# Patient Record
Sex: Female | Born: 1970
Health system: Southern US, Community
[De-identification: ages and names within clinical notes are randomized; demographics above are authoritative.]

## PROBLEM LIST (undated history)

## (undated) DIAGNOSIS — K4090 Unilateral inguinal hernia, without obstruction or gangrene, not specified as recurrent: Secondary | ICD-10-CM

## (undated) DIAGNOSIS — E119 Type 2 diabetes mellitus without complications: Secondary | ICD-10-CM

## (undated) DIAGNOSIS — J410 Simple chronic bronchitis: Secondary | ICD-10-CM

## (undated) DIAGNOSIS — I639 Cerebral infarction, unspecified: Secondary | ICD-10-CM

## (undated) DIAGNOSIS — F319 Bipolar disorder, unspecified: Secondary | ICD-10-CM

## (undated) DIAGNOSIS — N182 Chronic kidney disease, stage 2 (mild): Secondary | ICD-10-CM

## (undated) DIAGNOSIS — G459 Transient cerebral ischemic attack, unspecified: Secondary | ICD-10-CM

## (undated) DIAGNOSIS — M62838 Other muscle spasm: Secondary | ICD-10-CM

## (undated) DIAGNOSIS — R0600 Dyspnea, unspecified: Secondary | ICD-10-CM

## (undated) DIAGNOSIS — C801 Malignant (primary) neoplasm, unspecified: Secondary | ICD-10-CM

## (undated) DIAGNOSIS — W3400XA Accidental discharge from unspecified firearms or gun, initial encounter: Secondary | ICD-10-CM

## (undated) DIAGNOSIS — I2510 Atherosclerotic heart disease of native coronary artery without angina pectoris: Secondary | ICD-10-CM

## (undated) DIAGNOSIS — R39198 Other difficulties with micturition: Secondary | ICD-10-CM

## (undated) DIAGNOSIS — Z8489 Family history of other specified conditions: Secondary | ICD-10-CM

## (undated) DIAGNOSIS — K219 Gastro-esophageal reflux disease without esophagitis: Secondary | ICD-10-CM

## (undated) DIAGNOSIS — S3140XA Unspecified open wound of vagina and vulva, initial encounter: Secondary | ICD-10-CM

## (undated) DIAGNOSIS — Z720 Tobacco use: Secondary | ICD-10-CM

## (undated) DIAGNOSIS — I10 Essential (primary) hypertension: Secondary | ICD-10-CM

## (undated) DIAGNOSIS — I4510 Unspecified right bundle-branch block: Secondary | ICD-10-CM

## (undated) DIAGNOSIS — J449 Chronic obstructive pulmonary disease, unspecified: Secondary | ICD-10-CM

## (undated) DIAGNOSIS — R569 Unspecified convulsions: Secondary | ICD-10-CM

## (undated) DIAGNOSIS — R600 Localized edema: Secondary | ICD-10-CM

## (undated) DIAGNOSIS — I359 Nonrheumatic aortic valve disorder, unspecified: Secondary | ICD-10-CM

## (undated) DIAGNOSIS — N764 Abscess of vulva: Secondary | ICD-10-CM

## (undated) DIAGNOSIS — Z86718 Personal history of other venous thrombosis and embolism: Secondary | ICD-10-CM

## (undated) DIAGNOSIS — A419 Sepsis, unspecified organism: Secondary | ICD-10-CM

## (undated) DIAGNOSIS — R000 Tachycardia, unspecified: Secondary | ICD-10-CM

## (undated) DIAGNOSIS — I509 Heart failure, unspecified: Secondary | ICD-10-CM

## (undated) DIAGNOSIS — F419 Anxiety disorder, unspecified: Secondary | ICD-10-CM

## (undated) DIAGNOSIS — E785 Hyperlipidemia, unspecified: Secondary | ICD-10-CM

## (undated) DIAGNOSIS — G629 Polyneuropathy, unspecified: Secondary | ICD-10-CM

## (undated) DIAGNOSIS — I5032 Chronic diastolic (congestive) heart failure: Secondary | ICD-10-CM

## (undated) DIAGNOSIS — E781 Pure hyperglyceridemia: Secondary | ICD-10-CM

## (undated) DIAGNOSIS — J45909 Unspecified asthma, uncomplicated: Secondary | ICD-10-CM

## (undated) HISTORY — PX: OTHER SURGICAL HISTORY: SHX169

## (undated) HISTORY — DX: Unilateral inguinal hernia, without obstruction or gangrene, not specified as recurrent: K40.90

## (undated) HISTORY — PX: HERNIA REPAIR: SHX51

## (undated) HISTORY — PX: LAPAROSCOPY: SHX197

## (undated) HISTORY — DX: Pure hyperglyceridemia: E78.1

## (undated) HISTORY — PX: MASTECTOMY: SHX3

## (undated) HISTORY — DX: Atherosclerotic heart disease of native coronary artery without angina pectoris: I25.10

## (undated) HISTORY — DX: Other difficulties with micturition: R39.198

## (undated) HISTORY — DX: Tobacco use: Z72.0

## (undated) HISTORY — DX: Nonrheumatic aortic valve disorder, unspecified: I35.9

## (undated) HISTORY — DX: Chronic kidney disease, stage 2 (mild): N18.2

## (undated) HISTORY — DX: Sepsis, unspecified organism: A41.9

## (undated) HISTORY — DX: Localized edema: R60.0

## (undated) HISTORY — DX: Unspecified open wound of vagina and vulva, initial encounter: S31.40XA

## (undated) HISTORY — DX: Abscess of vulva: N76.4

## (undated) HISTORY — DX: Chronic diastolic (congestive) heart failure: I50.32

## (undated) HISTORY — DX: Unspecified right bundle-branch block: I45.10

## (undated) HISTORY — DX: Tachycardia, unspecified: R00.0

---
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
## 2004-01-10 ENCOUNTER — Emergency Department (HOSPITAL_COMMUNITY): Admission: EM | Admit: 2004-01-10 | Discharge: 2004-01-10 | Payer: Self-pay | Admitting: Emergency Medicine

## 2005-02-26 ENCOUNTER — Emergency Department (HOSPITAL_COMMUNITY): Admission: EM | Admit: 2005-02-26 | Discharge: 2005-02-26 | Payer: Self-pay | Admitting: Emergency Medicine

## 2005-09-05 ENCOUNTER — Emergency Department (HOSPITAL_COMMUNITY): Admission: EM | Admit: 2005-09-05 | Discharge: 2005-09-05 | Payer: Self-pay | Admitting: Emergency Medicine

## 2005-11-13 ENCOUNTER — Emergency Department (HOSPITAL_COMMUNITY): Admission: EM | Admit: 2005-11-13 | Discharge: 2005-11-13 | Payer: Self-pay | Admitting: Emergency Medicine

## 2005-11-26 IMAGING — CR DG FOOT COMPLETE 3+V*L*
3 series · 3 of 3 positions shown · non-contrast
Comparison: none

CLINICAL DATA: Fall down steps.  Left posterior foot trauma and pain.
 LEFT FOOT -   3 VIEW:
 There is no evidence of acute fracture or dislocation.  Plantar and dorsal calcaneal spurs are noted.  There is moderate hallux valgus deformity with bunion formation.

[view not recorded (1 of 3)]
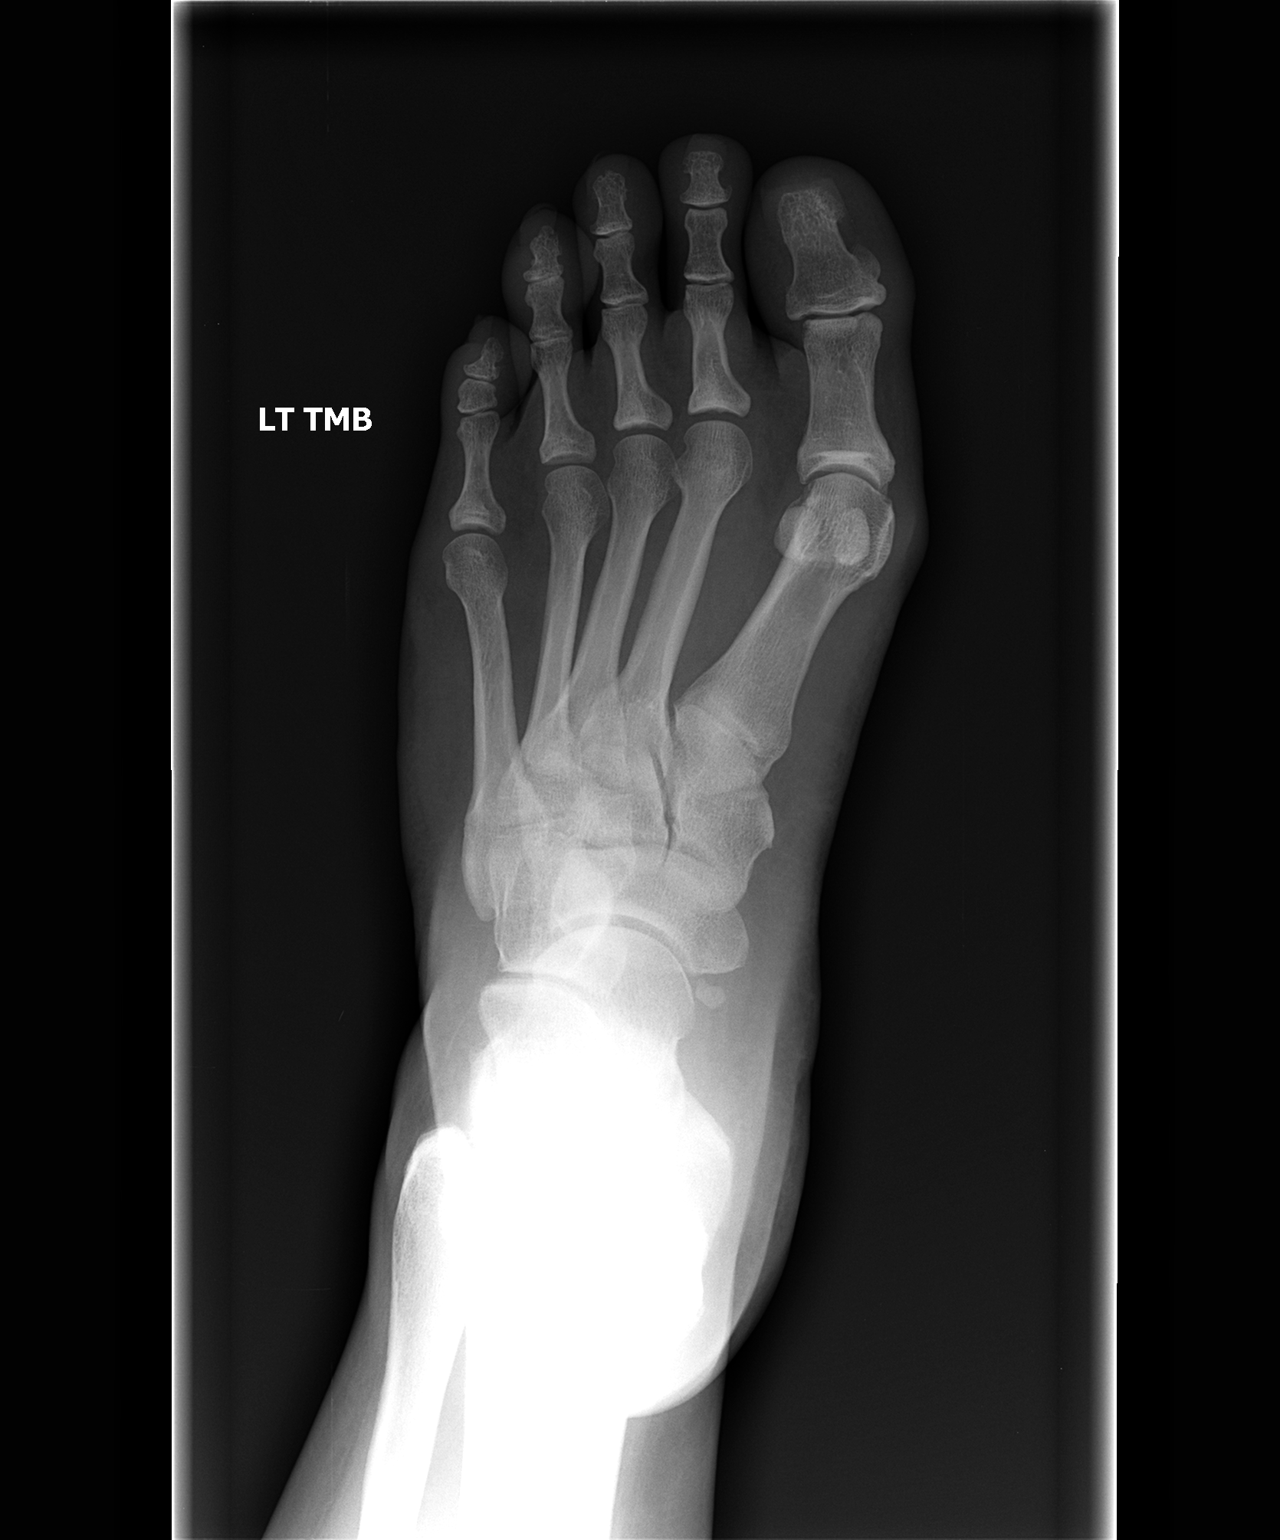

[view not recorded (2 of 3)]
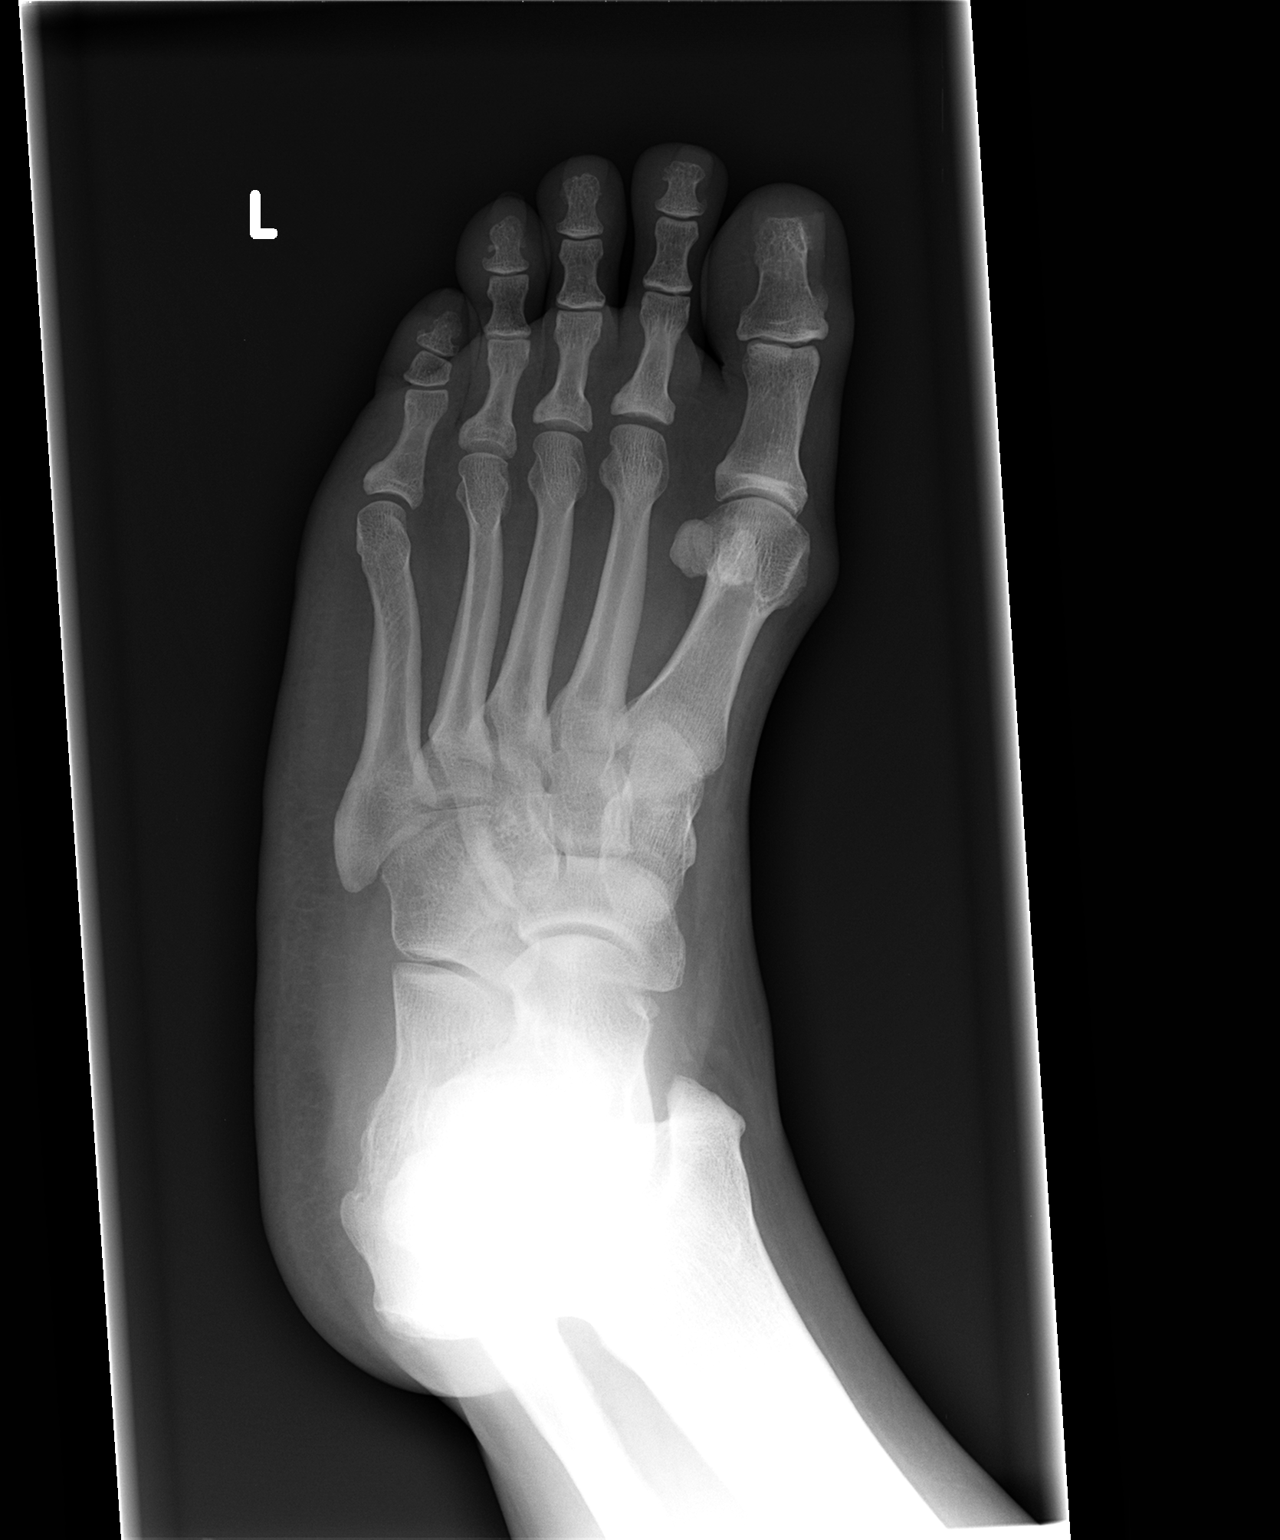

[view not recorded (3 of 3)]
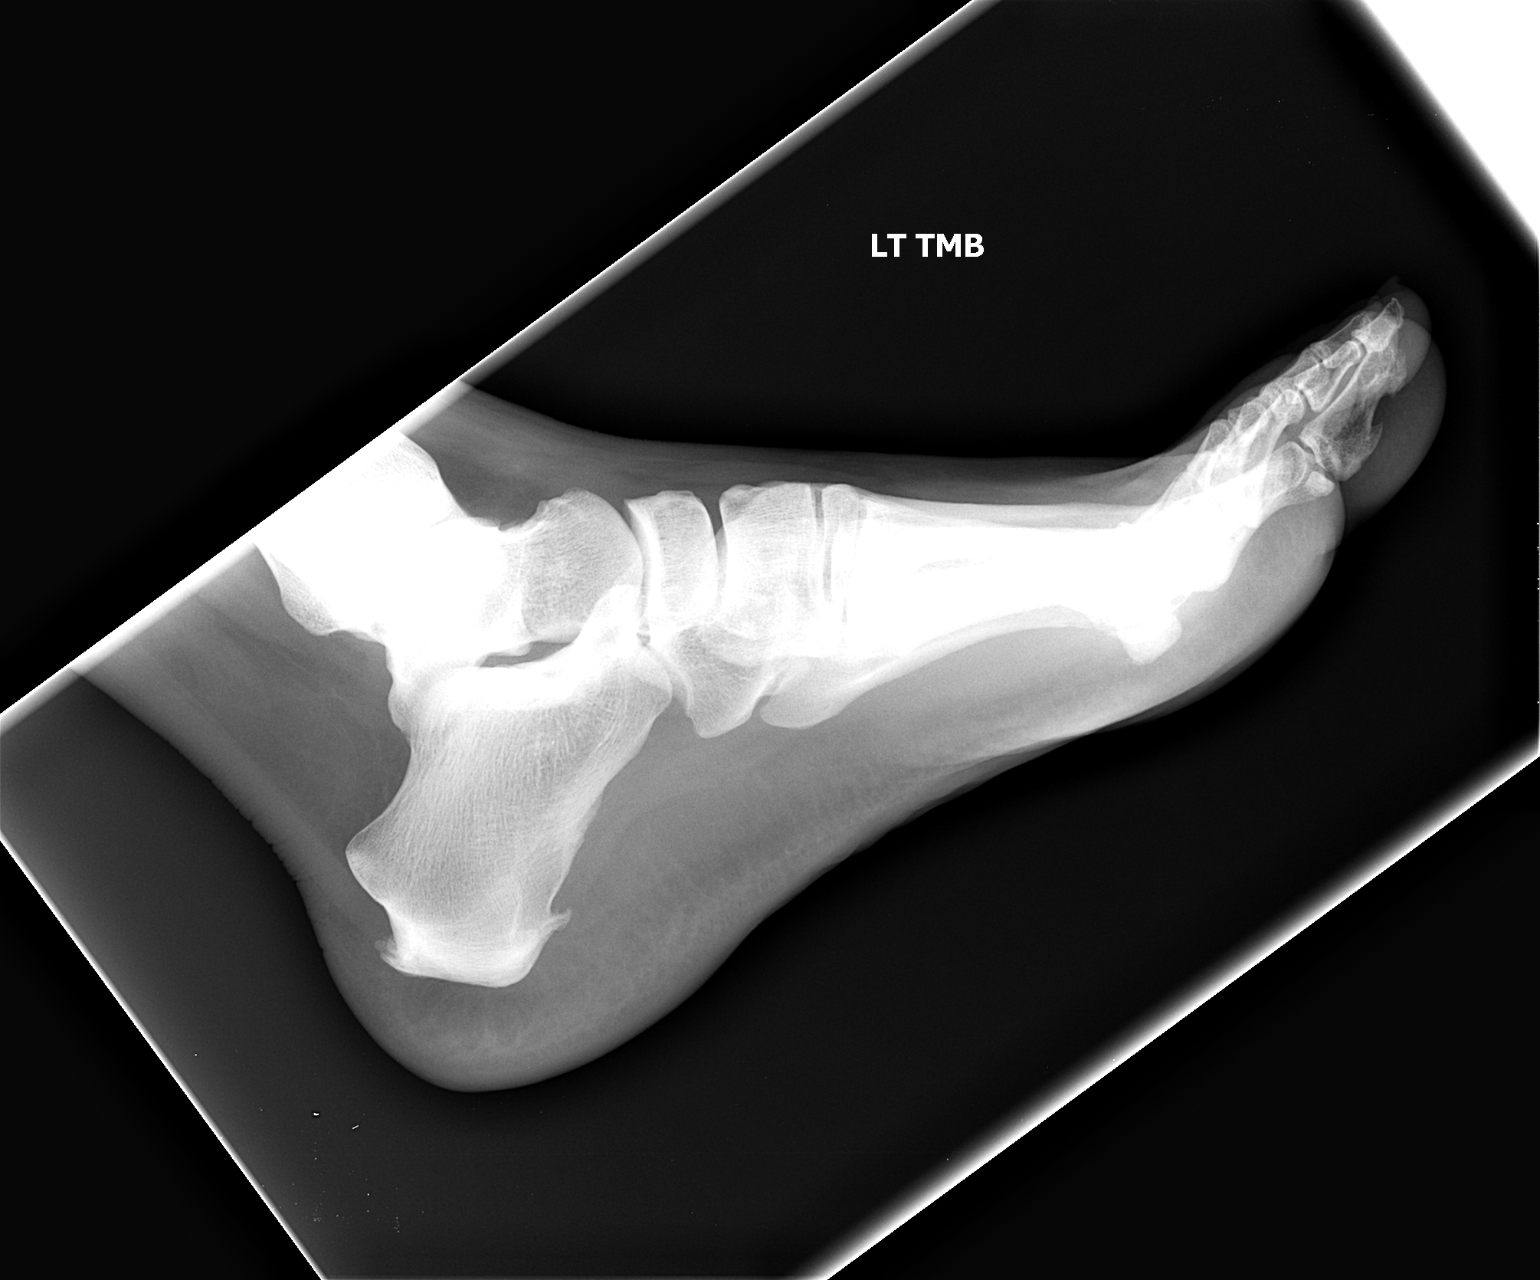

[3 of 3 positions shown; findings below may reference images not displayed]

IMPRESSION: 1.  No acute findings.  
 2.  Calcaneal spurs.
 3.  Bunion.

## 2006-01-04 ENCOUNTER — Emergency Department (HOSPITAL_COMMUNITY): Admission: EM | Admit: 2006-01-04 | Discharge: 2006-01-04 | Payer: Self-pay | Admitting: Emergency Medicine

## 2006-01-06 ENCOUNTER — Emergency Department (HOSPITAL_COMMUNITY): Admission: EM | Admit: 2006-01-06 | Discharge: 2006-01-06 | Payer: Self-pay | Admitting: Emergency Medicine

## 2006-01-21 ENCOUNTER — Inpatient Hospital Stay (HOSPITAL_COMMUNITY): Admission: EM | Admit: 2006-01-21 | Discharge: 2006-01-22 | Payer: Self-pay | Admitting: Emergency Medicine

## 2006-01-21 ENCOUNTER — Ambulatory Visit: Payer: Self-pay | Admitting: Family Medicine

## 2006-01-27 ENCOUNTER — Ambulatory Visit: Payer: Self-pay | Admitting: Family Medicine

## 2006-01-31 ENCOUNTER — Ambulatory Visit: Payer: Self-pay | Admitting: Family Medicine

## 2006-03-23 ENCOUNTER — Ambulatory Visit: Payer: Self-pay | Admitting: Family Medicine

## 2006-03-30 ENCOUNTER — Ambulatory Visit: Payer: Self-pay | Admitting: Family Medicine

## 2006-04-04 ENCOUNTER — Ambulatory Visit: Payer: Self-pay | Admitting: Family Medicine

## 2006-04-24 ENCOUNTER — Ambulatory Visit (HOSPITAL_COMMUNITY): Admission: RE | Admit: 2006-04-24 | Discharge: 2006-04-24 | Payer: Self-pay | Admitting: Family Medicine

## 2006-04-24 ENCOUNTER — Ambulatory Visit: Payer: Self-pay | Admitting: Sports Medicine

## 2006-05-02 ENCOUNTER — Ambulatory Visit: Payer: Self-pay | Admitting: Family Medicine

## 2006-05-08 ENCOUNTER — Ambulatory Visit: Payer: Self-pay | Admitting: Family Medicine

## 2006-05-17 ENCOUNTER — Ambulatory Visit (HOSPITAL_COMMUNITY): Admission: RE | Admit: 2006-05-17 | Discharge: 2006-05-17 | Payer: Self-pay | Admitting: Sports Medicine

## 2006-05-23 ENCOUNTER — Ambulatory Visit: Payer: Self-pay | Admitting: Family Medicine

## 2006-06-12 ENCOUNTER — Ambulatory Visit: Payer: Self-pay | Admitting: Sports Medicine

## 2006-07-28 ENCOUNTER — Ambulatory Visit: Payer: Self-pay | Admitting: Family Medicine

## 2006-09-07 DIAGNOSIS — F17200 Nicotine dependence, unspecified, uncomplicated: Secondary | ICD-10-CM | POA: Insufficient documentation

## 2006-09-07 DIAGNOSIS — Z720 Tobacco use: Secondary | ICD-10-CM | POA: Insufficient documentation

## 2006-09-07 DIAGNOSIS — IMO0002 Reserved for concepts with insufficient information to code with codable children: Secondary | ICD-10-CM | POA: Insufficient documentation

## 2006-10-06 IMAGING — CT CT ANGIO CHEST
2 of 5 series · 19 of 36 positions shown · IV contrast (120 ML OMNI 300)
Comparison: none

CLINICAL DATA: Shortness of breath with midsternal chest pain for two days.
 CT ANGIOGRAPHY OF CHEST ? 01/06/06:
TECHNIQUE: Multidetector CT imaging of the chest was performed during bolus injection of intravenous contrast.  Multiplanar CT angiographic image reconstructions were generated to evaluate the vascular anatomy. 
 Contrast:  Scans were performed following intravenous injection of 120 cc of Omnipaque 300. 
 There is no evidence of pulmonary embolus, infiltrate, effusion, or mass lesion.  There is a 2 cm nodule in the superior aspect of the right hilum which is nonspecific with smaller perihilar lymph nodes bilaterally.
 Incidental note is made of a tiny defect in the posteromedial aspect of the left hemidiaphragm which is not felt to be significant.  The bony structures are normal including the sternum and manubrium.  The heart size and vascularity are normal.  
 There is some minimal linear atelectasis at the right base.

[Series 3: pe · axial · 0.70mm/px · z∈[-251,-7]mm · 16 of 223 slices shown]
[im 14/223  lung]
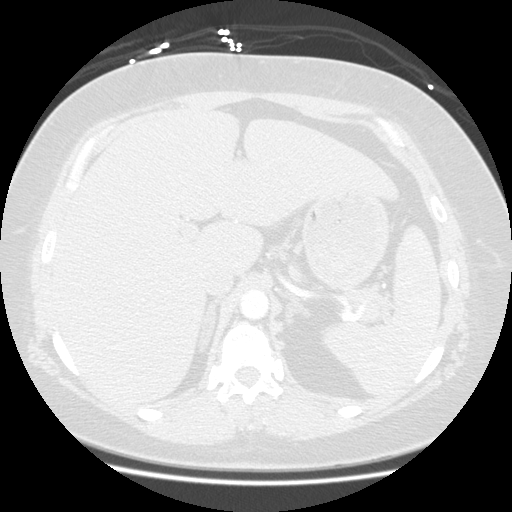
[im 27/223  mediastinal]
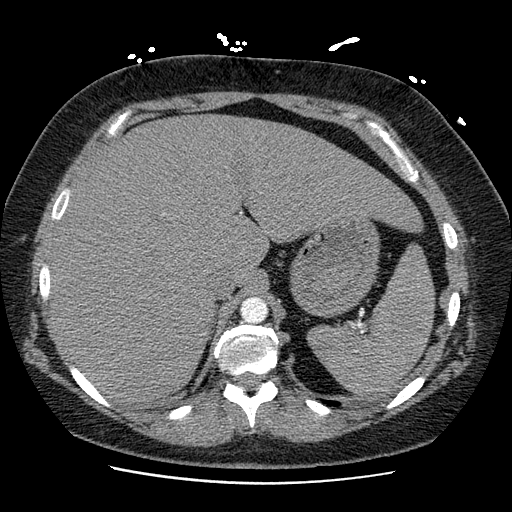
[im 40/223  lung]
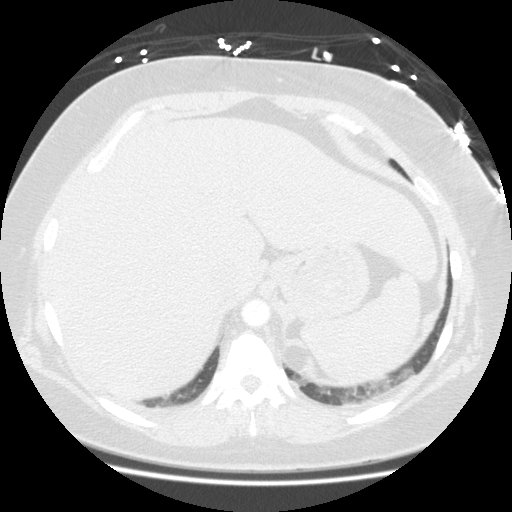
[im 53/223  mediastinal]
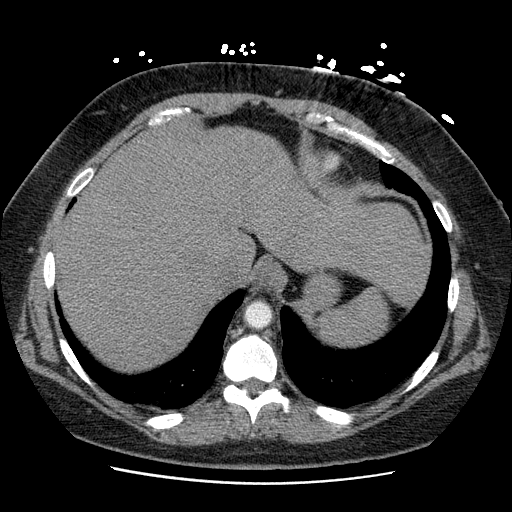
[im 66/223  lung]
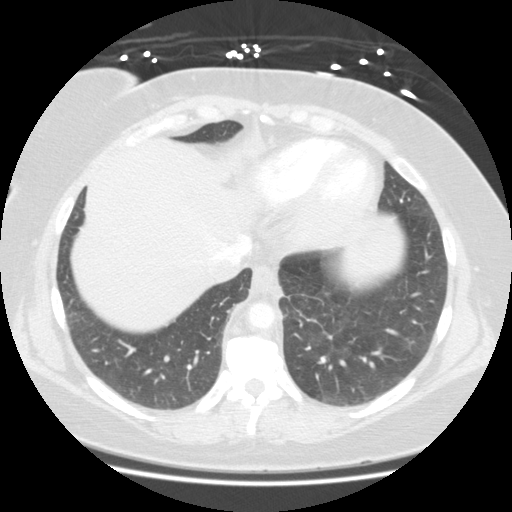
[im 79/223  mediastinal]
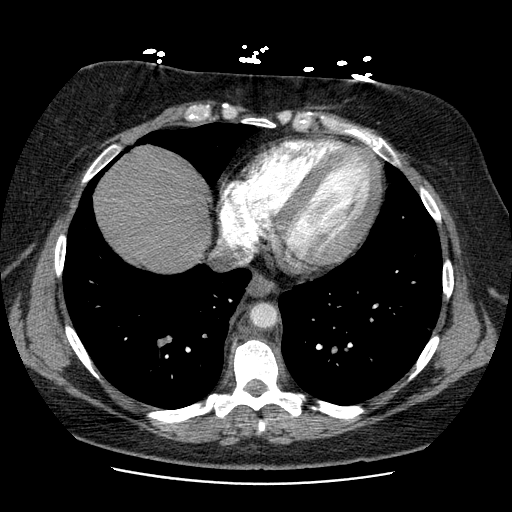
[im 92/223  lung]
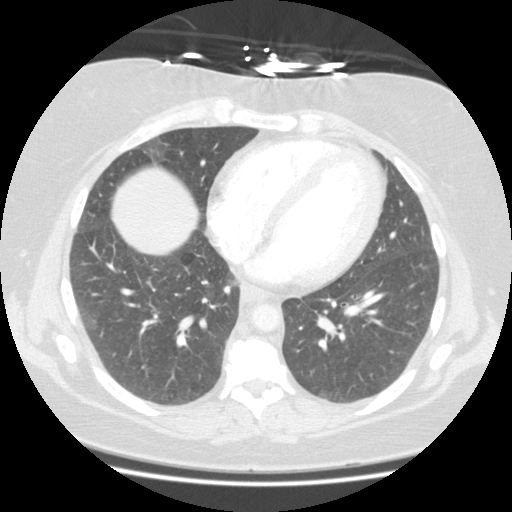
[im 105/223  mediastinal]
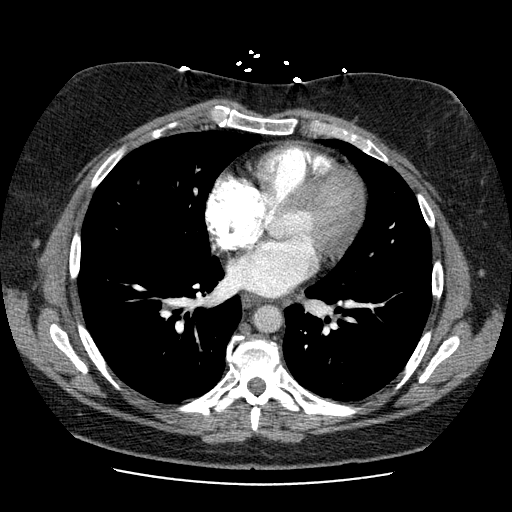
[im 118/223  lung]
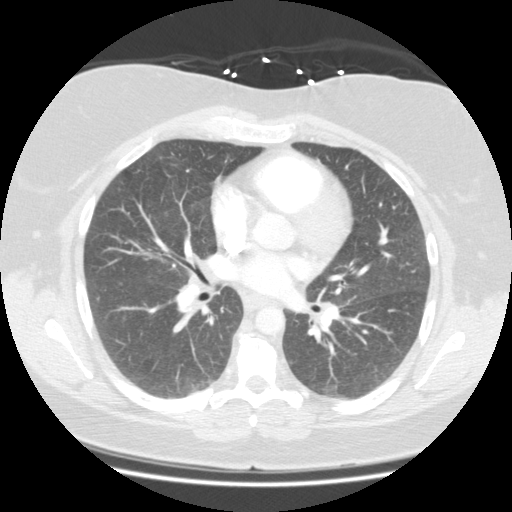
[im 131/223  mediastinal]
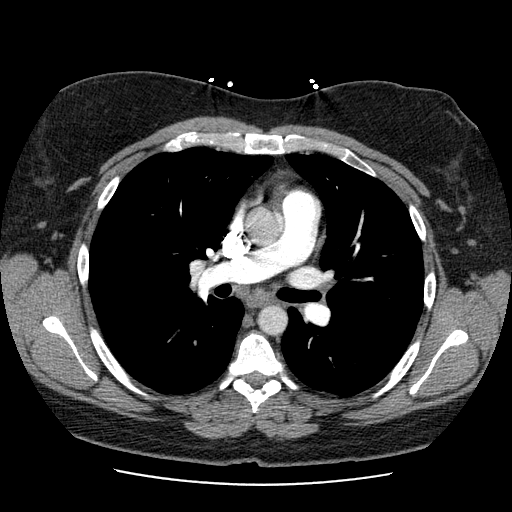
[im 144/223  lung]
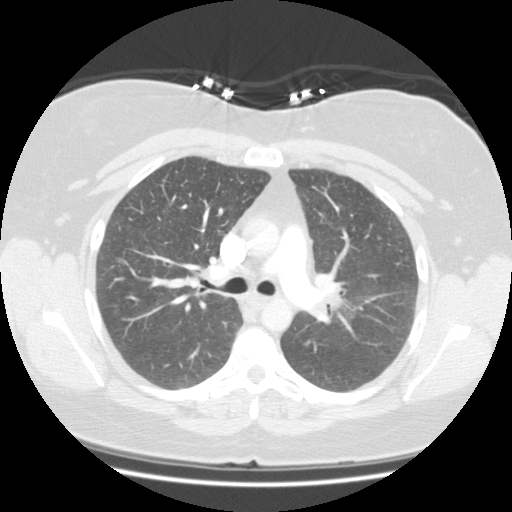
[im 157/223  mediastinal]
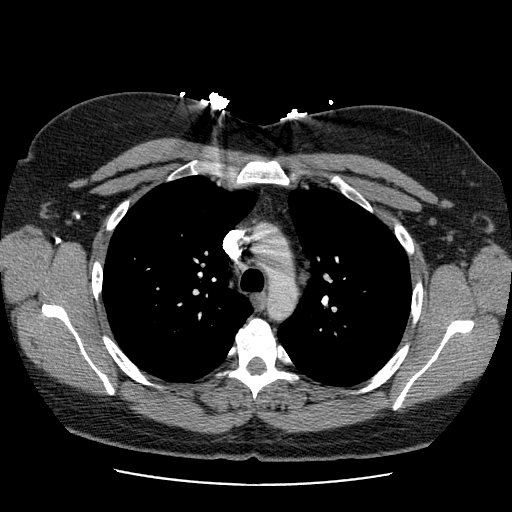
[im 170/223  lung]
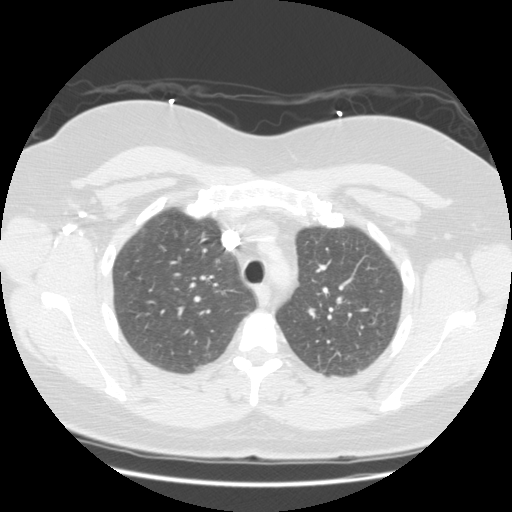
[im 183/223  mediastinal]
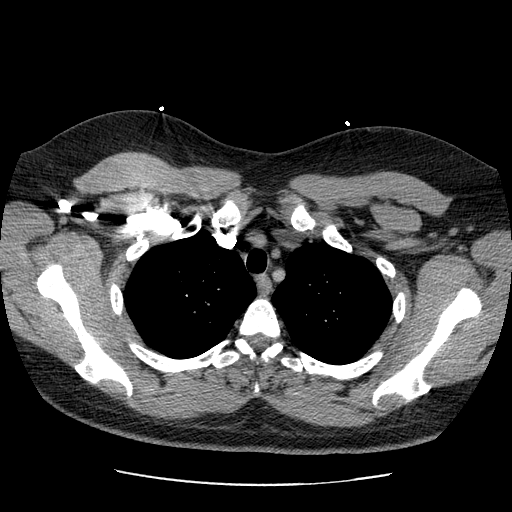
[im 196/223  lung]
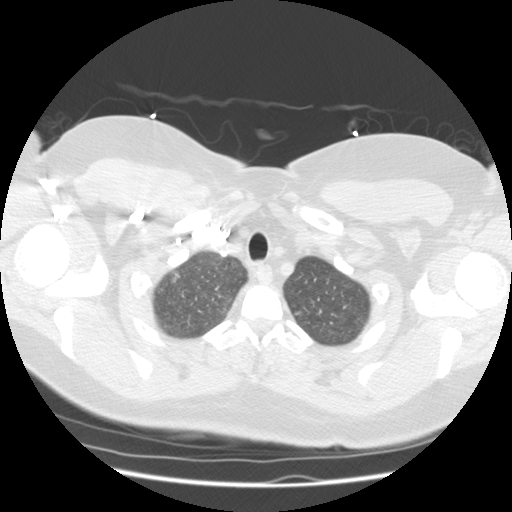
[im 209/223  mediastinal]
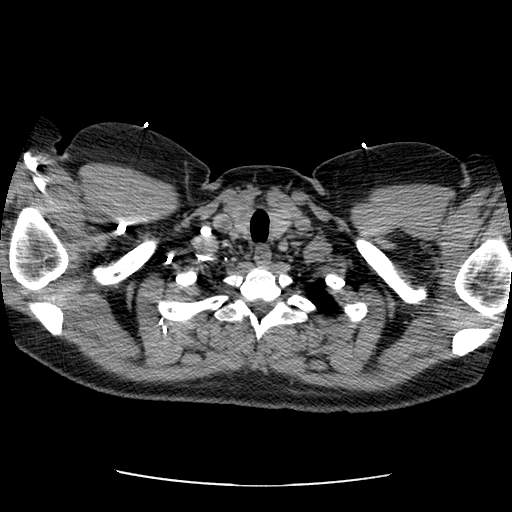

[Series 301: reformatted · coronal · 0.70mm/px · 3 of 141 slices shown]
[im 29/141  mediastinal]
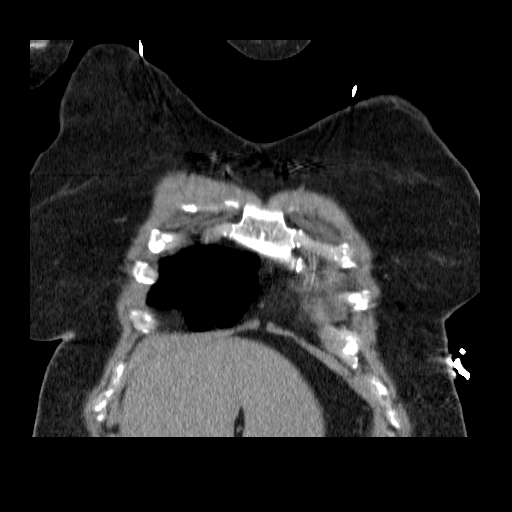
[im 57/141  mediastinal]
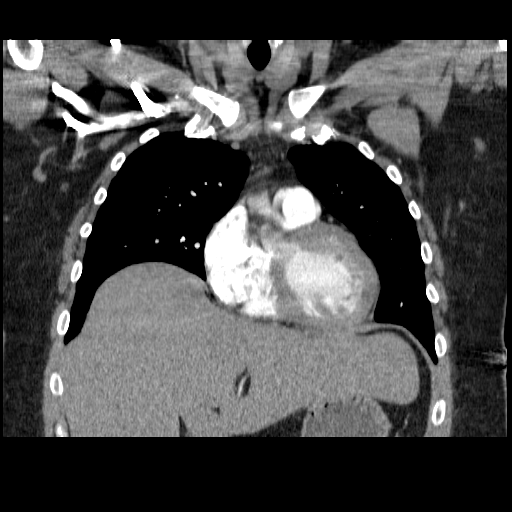
[im 85/141  mediastinal]
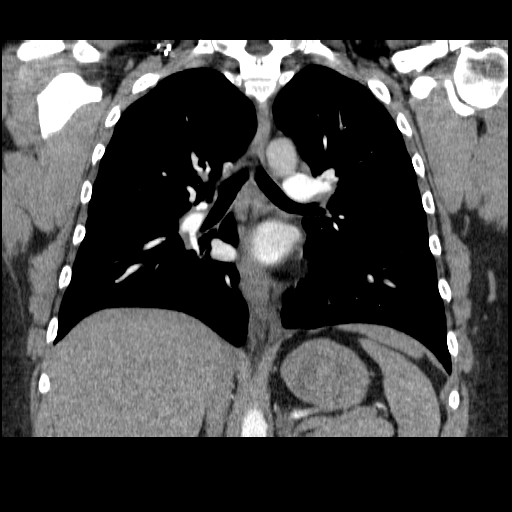

[19 of 36 positions shown; findings below may reference images not displayed]

IMPRESSION: No significant abnormality.

## 2006-10-18 ENCOUNTER — Ambulatory Visit: Payer: Self-pay | Admitting: Sports Medicine

## 2006-10-18 ENCOUNTER — Telehealth: Payer: Self-pay | Admitting: *Deleted

## 2006-10-21 IMAGING — CR DG CHEST 2V
2 series · 2 of 2 positions shown · non-contrast
Comparison: 01/04/06 and CT of 01/06/06.

CLINICAL DATA: Shortness of breath. Productive cough.  History of pneumonia and bronchitis. 
 CHEST ? 2 VIEW:

[w chest pa]
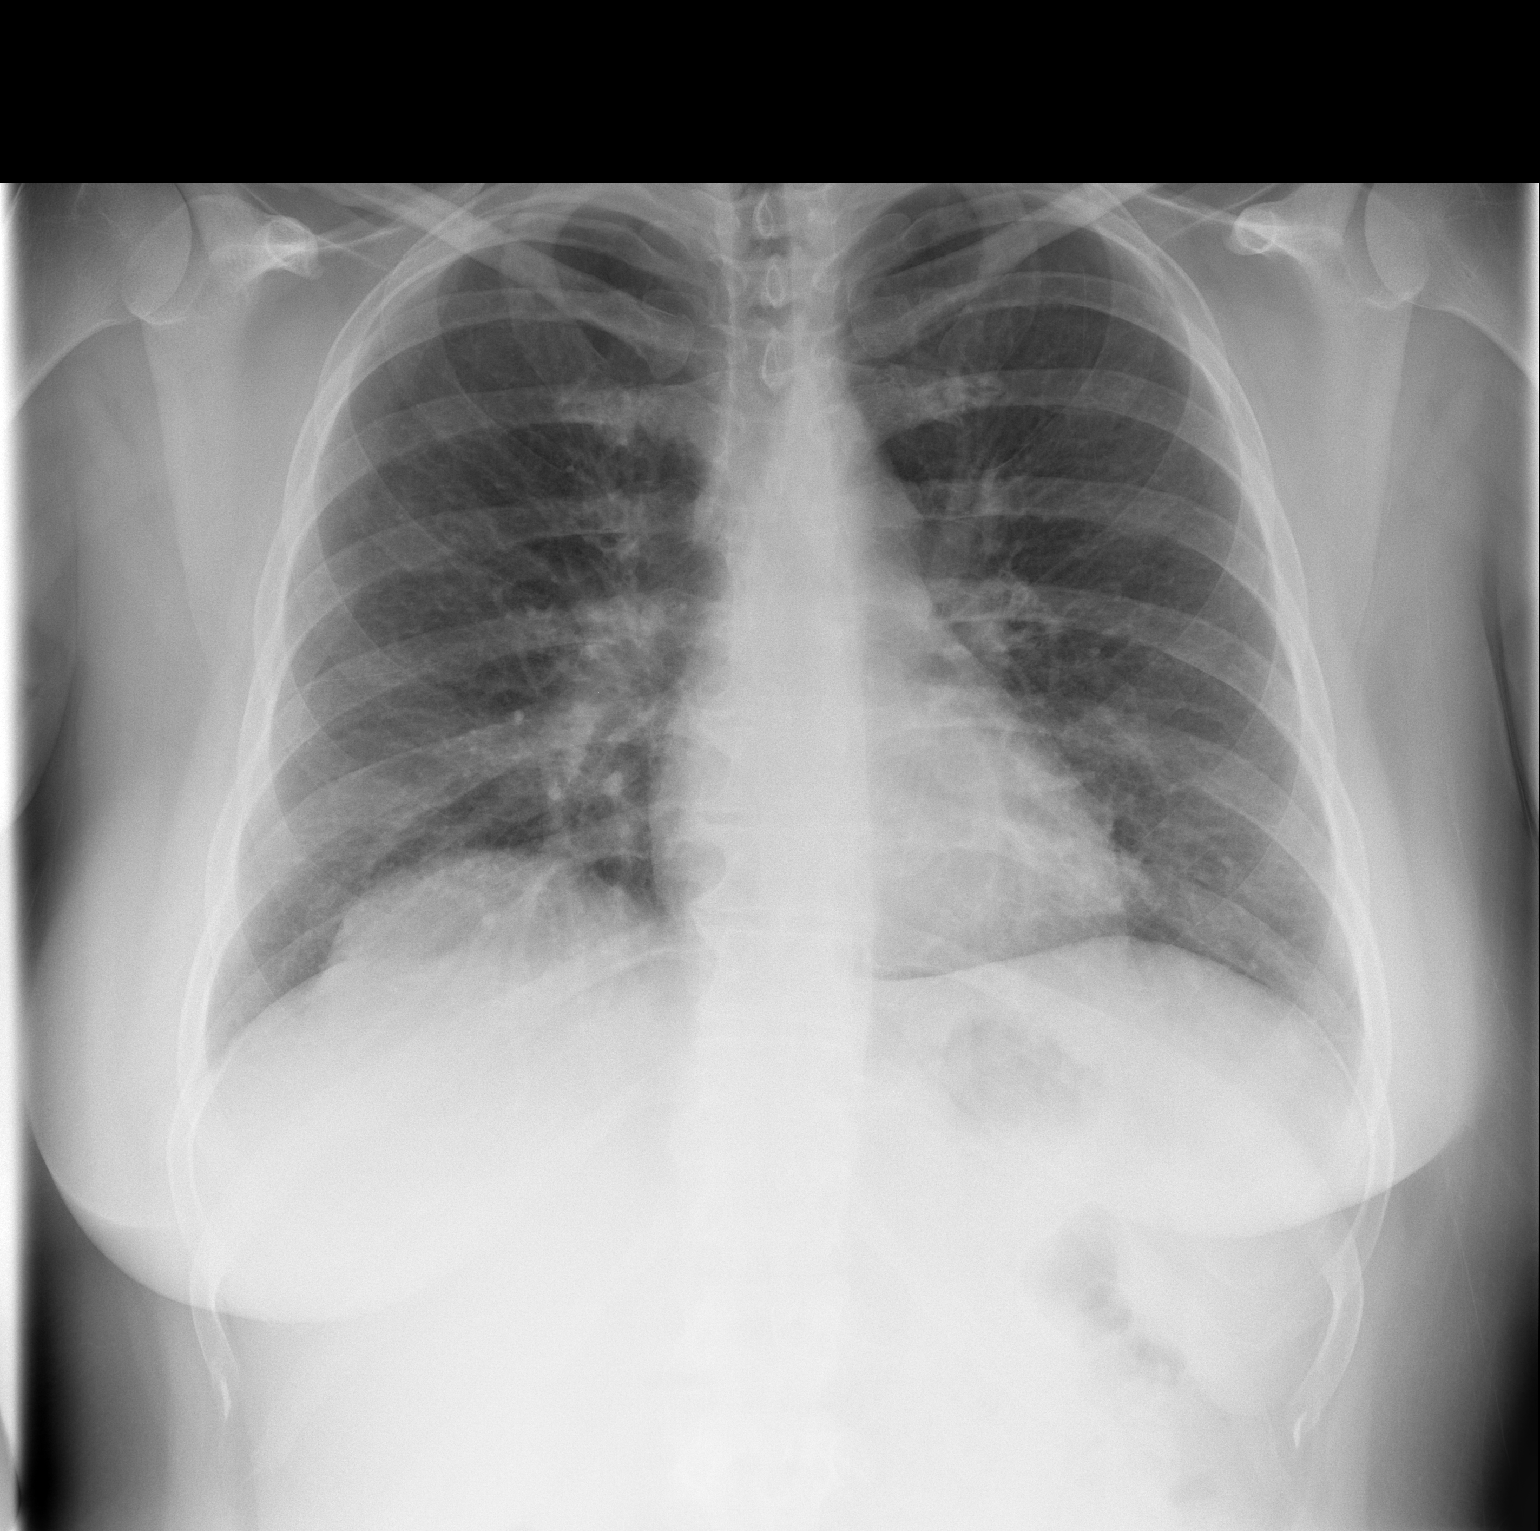

[w chest lat]
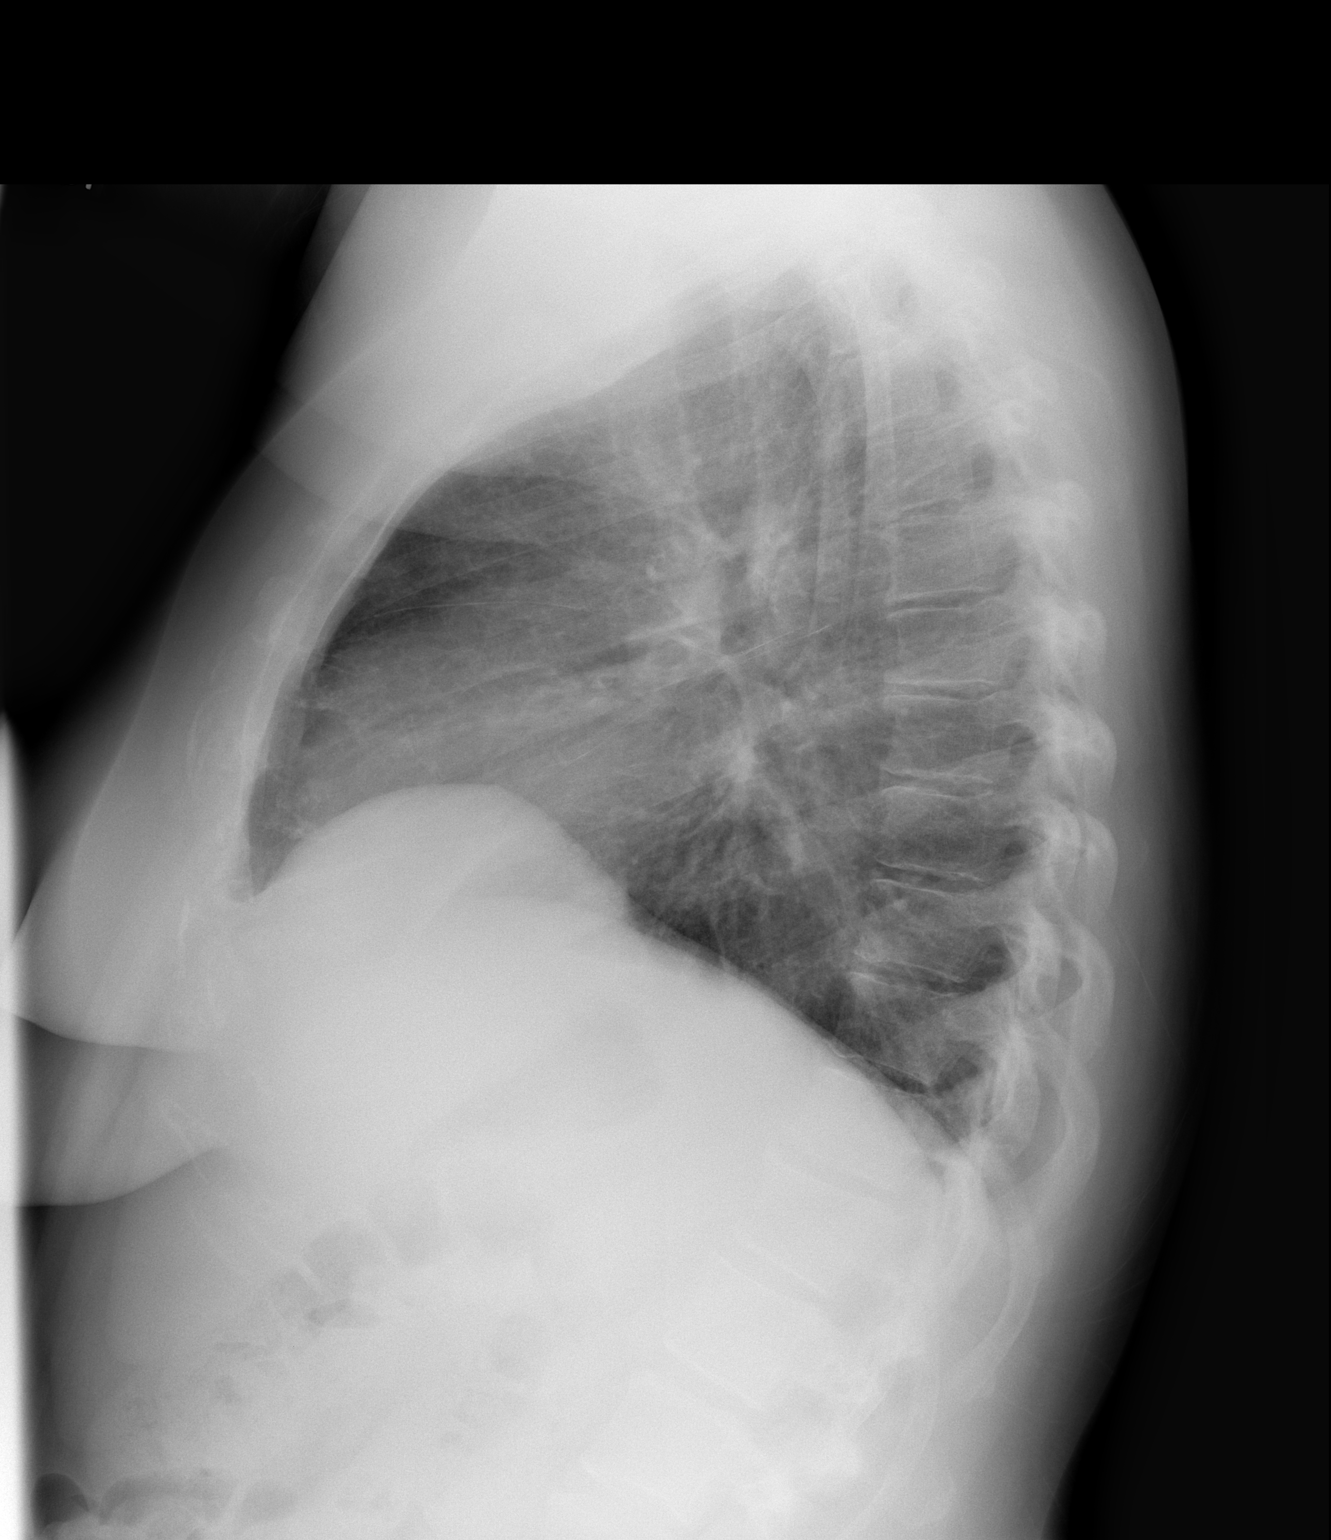

[2 of 2 positions shown; findings below may reference images not displayed]

FINDINGS: Midline trachea. Heart size is normal.  Mediastinal contours are unremarkable.  There is peribronchial thickening. The costophrenic angles are sharp.  There is no focal airspace opacity.  Multilevel thoracic spondylosis.
IMPRESSION: Similar peribronchial thickening could relate to chronic bronchitis or smoking.  No evidence of pneumonia or other acute process.

## 2007-01-11 ENCOUNTER — Ambulatory Visit: Payer: Self-pay | Admitting: Family Medicine

## 2007-01-11 ENCOUNTER — Telehealth: Payer: Self-pay | Admitting: *Deleted

## 2007-02-08 ENCOUNTER — Telehealth: Payer: Self-pay | Admitting: *Deleted

## 2007-02-24 ENCOUNTER — Emergency Department (HOSPITAL_COMMUNITY): Admission: EM | Admit: 2007-02-24 | Discharge: 2007-02-24 | Payer: Self-pay | Admitting: Emergency Medicine

## 2007-02-25 ENCOUNTER — Emergency Department (HOSPITAL_COMMUNITY): Admission: EM | Admit: 2007-02-25 | Discharge: 2007-02-25 | Payer: Self-pay | Admitting: Emergency Medicine

## 2007-02-27 ENCOUNTER — Ambulatory Visit: Payer: Self-pay | Admitting: Family Medicine

## 2007-02-27 ENCOUNTER — Telehealth (INDEPENDENT_AMBULATORY_CARE_PROVIDER_SITE_OTHER): Payer: Self-pay | Admitting: *Deleted

## 2007-02-27 LAB — CONVERTED CEMR LAB: Hgb A1c MFr Bld: 11.8 %

## 2007-03-01 ENCOUNTER — Telehealth (INDEPENDENT_AMBULATORY_CARE_PROVIDER_SITE_OTHER): Payer: Self-pay | Admitting: *Deleted

## 2007-03-07 ENCOUNTER — Ambulatory Visit: Payer: Self-pay | Admitting: Family Medicine

## 2007-03-07 ENCOUNTER — Telehealth (INDEPENDENT_AMBULATORY_CARE_PROVIDER_SITE_OTHER): Payer: Self-pay | Admitting: *Deleted

## 2007-03-09 ENCOUNTER — Ambulatory Visit (HOSPITAL_COMMUNITY): Admission: RE | Admit: 2007-03-09 | Discharge: 2007-03-09 | Payer: Self-pay | Admitting: Family Medicine

## 2007-03-09 ENCOUNTER — Encounter: Payer: Self-pay | Admitting: Family Medicine

## 2007-03-09 ENCOUNTER — Ambulatory Visit: Payer: Self-pay | Admitting: Vascular Surgery

## 2007-03-09 ENCOUNTER — Telehealth: Payer: Self-pay | Admitting: Family Medicine

## 2007-03-13 ENCOUNTER — Ambulatory Visit: Payer: Self-pay | Admitting: Family Medicine

## 2007-03-22 ENCOUNTER — Ambulatory Visit: Payer: Self-pay | Admitting: Family Medicine

## 2007-03-26 ENCOUNTER — Encounter: Admission: RE | Admit: 2007-03-26 | Discharge: 2007-03-26 | Payer: Self-pay | Admitting: Family Medicine

## 2007-03-26 ENCOUNTER — Encounter: Payer: Self-pay | Admitting: Family Medicine

## 2007-03-29 ENCOUNTER — Ambulatory Visit: Payer: Self-pay | Admitting: Family Medicine

## 2007-03-29 ENCOUNTER — Telehealth: Payer: Self-pay | Admitting: Family Medicine

## 2007-03-29 LAB — CONVERTED CEMR LAB: Beta hcg, urine, semiquantitative: NEGATIVE

## 2007-04-16 ENCOUNTER — Telehealth: Payer: Self-pay | Admitting: Family Medicine

## 2007-04-21 ENCOUNTER — Inpatient Hospital Stay (HOSPITAL_COMMUNITY): Admission: AD | Admit: 2007-04-21 | Discharge: 2007-04-21 | Payer: Self-pay | Admitting: Family Medicine

## 2007-04-23 ENCOUNTER — Ambulatory Visit: Payer: Self-pay | Admitting: Family Medicine

## 2007-04-23 ENCOUNTER — Encounter: Payer: Self-pay | Admitting: Family Medicine

## 2007-04-23 LAB — CONVERTED CEMR LAB
BUN: 12 mg/dL (ref 6–23)
CO2: 23 meq/L (ref 19–32)
Calcium: 9.3 mg/dL (ref 8.4–10.5)
Chloride: 99 meq/L (ref 96–112)
Creatinine, Ser: 0.76 mg/dL (ref 0.40–1.20)
Glucose, Bld: 266 mg/dL — ABNORMAL HIGH (ref 70–99)
Hgb A1c MFr Bld: 9.4 %
Potassium: 4.5 meq/L (ref 3.5–5.3)
Sodium: 135 meq/L (ref 135–145)

## 2007-04-24 ENCOUNTER — Encounter: Payer: Self-pay | Admitting: Family Medicine

## 2007-05-15 ENCOUNTER — Telehealth: Payer: Self-pay | Admitting: *Deleted

## 2007-05-16 ENCOUNTER — Ambulatory Visit: Payer: Self-pay | Admitting: Family Medicine

## 2007-05-23 ENCOUNTER — Telehealth: Payer: Self-pay | Admitting: *Deleted

## 2007-05-23 ENCOUNTER — Ambulatory Visit: Payer: Self-pay | Admitting: Family Medicine

## 2007-05-30 ENCOUNTER — Encounter: Payer: Self-pay | Admitting: Family Medicine

## 2007-05-30 ENCOUNTER — Ambulatory Visit: Payer: Self-pay | Admitting: Family Medicine

## 2007-05-30 DIAGNOSIS — F418 Other specified anxiety disorders: Secondary | ICD-10-CM | POA: Insufficient documentation

## 2007-05-30 DIAGNOSIS — J4489 Other specified chronic obstructive pulmonary disease: Secondary | ICD-10-CM | POA: Insufficient documentation

## 2007-05-30 DIAGNOSIS — J449 Chronic obstructive pulmonary disease, unspecified: Secondary | ICD-10-CM | POA: Insufficient documentation

## 2007-05-30 LAB — CONVERTED CEMR LAB
Bilirubin Urine: NEGATIVE
Blood in Urine, dipstick: NEGATIVE
Cholesterol: 202 mg/dL — ABNORMAL HIGH (ref 0–200)
Glucose, Urine, Semiquant: 500
HDL: 36 mg/dL — ABNORMAL LOW (ref >39)
Hgb A1c MFr Bld: 9.5 %
Nitrite: NEGATIVE
Protein, U semiquant: 30
Specific Gravity, Urine: 1.02
Total CHOL/HDL Ratio: 5.6
Triglycerides: 540 mg/dL — ABNORMAL HIGH (ref <150)
Urobilinogen, UA: 0.2
WBC Urine, dipstick: NEGATIVE
pH: 7

## 2007-06-11 ENCOUNTER — Ambulatory Visit: Payer: Self-pay | Admitting: Family Medicine

## 2007-06-11 ENCOUNTER — Encounter (INDEPENDENT_AMBULATORY_CARE_PROVIDER_SITE_OTHER): Payer: Self-pay | Admitting: Family Medicine

## 2007-06-11 ENCOUNTER — Encounter: Payer: Self-pay | Admitting: *Deleted

## 2007-06-11 ENCOUNTER — Telehealth: Payer: Self-pay | Admitting: *Deleted

## 2007-06-11 LAB — CONVERTED CEMR LAB
BUN: 12 mg/dL (ref 6–23)
CO2: 23 meq/L (ref 19–32)
Calcium: 9.4 mg/dL (ref 8.4–10.5)
Chloride: 102 meq/L (ref 96–112)
Creatinine, Ser: 0.78 mg/dL (ref 0.40–1.20)
Glucose, Bld: 200 mg/dL — ABNORMAL HIGH (ref 70–99)
Potassium: 4.3 meq/L (ref 3.5–5.3)
Sodium: 138 meq/L (ref 135–145)

## 2007-06-19 ENCOUNTER — Telehealth: Payer: Self-pay | Admitting: Family Medicine

## 2007-07-17 ENCOUNTER — Telehealth (INDEPENDENT_AMBULATORY_CARE_PROVIDER_SITE_OTHER): Payer: Self-pay | Admitting: *Deleted

## 2007-07-17 ENCOUNTER — Encounter: Payer: Self-pay | Admitting: Family Medicine

## 2007-07-17 ENCOUNTER — Ambulatory Visit: Payer: Self-pay | Admitting: Family Medicine

## 2007-07-17 DIAGNOSIS — E781 Pure hyperglyceridemia: Secondary | ICD-10-CM | POA: Insufficient documentation

## 2007-07-17 LAB — CONVERTED CEMR LAB
ALT: 27 units/L (ref 0–35)
AST: 17 units/L (ref 0–37)
Albumin: 4 g/dL (ref 3.5–5.2)
Alkaline Phosphatase: 108 units/L (ref 39–117)
BUN: 12 mg/dL (ref 6–23)
CO2: 21 meq/L (ref 19–32)
Calcium: 9 mg/dL (ref 8.4–10.5)
Chloride: 100 meq/L (ref 96–112)
Creatinine, Ser: 0.73 mg/dL (ref 0.40–1.20)
Glucose, Bld: 290 mg/dL — ABNORMAL HIGH (ref 70–99)
Potassium: 4.1 meq/L (ref 3.5–5.3)
Sodium: 134 meq/L — ABNORMAL LOW (ref 135–145)
Total Bilirubin: 0.4 mg/dL (ref 0.3–1.2)
Total Protein: 7.2 g/dL (ref 6.0–8.3)

## 2007-08-02 ENCOUNTER — Telehealth: Payer: Self-pay | Admitting: *Deleted

## 2007-08-26 ENCOUNTER — Telehealth (INDEPENDENT_AMBULATORY_CARE_PROVIDER_SITE_OTHER): Payer: Self-pay | Admitting: *Deleted

## 2007-08-27 ENCOUNTER — Telehealth: Payer: Self-pay | Admitting: *Deleted

## 2007-08-28 ENCOUNTER — Ambulatory Visit: Payer: Self-pay | Admitting: Family Medicine

## 2007-08-28 LAB — CONVERTED CEMR LAB: Whiff Test: NEGATIVE

## 2007-09-01 ENCOUNTER — Telehealth (INDEPENDENT_AMBULATORY_CARE_PROVIDER_SITE_OTHER): Payer: Self-pay | Admitting: Family Medicine

## 2007-09-02 ENCOUNTER — Observation Stay (HOSPITAL_COMMUNITY): Admission: EM | Admit: 2007-09-02 | Discharge: 2007-09-02 | Payer: Self-pay | Admitting: Emergency Medicine

## 2007-09-02 ENCOUNTER — Ambulatory Visit: Payer: Self-pay | Admitting: Family Medicine

## 2007-09-02 LAB — CONVERTED CEMR LAB
Cholesterol: 217 mg/dL
HDL: 32 mg/dL
Hgb A1c MFr Bld: 11.1 %
TSH: 2.25 microintl units/mL
Triglycerides: 462 mg/dL

## 2007-09-04 ENCOUNTER — Telehealth: Payer: Self-pay | Admitting: Family Medicine

## 2007-09-12 ENCOUNTER — Telehealth: Payer: Self-pay | Admitting: Family Medicine

## 2007-09-13 ENCOUNTER — Ambulatory Visit: Payer: Self-pay | Admitting: Family Medicine

## 2007-09-13 LAB — CONVERTED CEMR LAB
Cholesterol, target level: 200 mg/dL
HDL goal, serum: 40 mg/dL
LDL Goal: 100 mg/dL

## 2007-10-01 ENCOUNTER — Ambulatory Visit: Payer: Self-pay | Admitting: Cardiology

## 2007-10-05 ENCOUNTER — Telehealth: Payer: Self-pay | Admitting: *Deleted

## 2007-10-05 ENCOUNTER — Ambulatory Visit: Payer: Self-pay

## 2007-10-05 ENCOUNTER — Telehealth (INDEPENDENT_AMBULATORY_CARE_PROVIDER_SITE_OTHER): Payer: Self-pay | Admitting: *Deleted

## 2007-10-05 ENCOUNTER — Encounter: Payer: Self-pay | Admitting: Family Medicine

## 2007-10-12 ENCOUNTER — Ambulatory Visit: Payer: Self-pay | Admitting: Family Medicine

## 2007-10-18 ENCOUNTER — Encounter: Payer: Self-pay | Admitting: Family Medicine

## 2007-10-30 ENCOUNTER — Ambulatory Visit: Payer: Self-pay | Admitting: Family Medicine

## 2007-10-30 ENCOUNTER — Encounter: Payer: Self-pay | Admitting: Family Medicine

## 2007-10-30 LAB — CONVERTED CEMR LAB
Bilirubin Urine: NEGATIVE
Glucose, Urine, Semiquant: 500
Nitrite: NEGATIVE
Protein, U semiquant: 30
Specific Gravity, Urine: 1.015
Urobilinogen, UA: 0.2
WBC Urine, dipstick: NEGATIVE
pH: 5.5

## 2007-11-07 ENCOUNTER — Encounter: Payer: Self-pay | Admitting: Family Medicine

## 2007-11-16 ENCOUNTER — Emergency Department (HOSPITAL_COMMUNITY): Admission: EM | Admit: 2007-11-16 | Discharge: 2007-11-16 | Payer: Self-pay | Admitting: Family Medicine

## 2007-11-18 ENCOUNTER — Emergency Department (HOSPITAL_COMMUNITY): Admission: EM | Admit: 2007-11-18 | Discharge: 2007-11-18 | Payer: Self-pay | Admitting: Emergency Medicine

## 2007-11-19 ENCOUNTER — Telehealth: Payer: Self-pay | Admitting: *Deleted

## 2007-11-19 ENCOUNTER — Ambulatory Visit: Payer: Self-pay

## 2007-11-21 ENCOUNTER — Telehealth (INDEPENDENT_AMBULATORY_CARE_PROVIDER_SITE_OTHER): Payer: Self-pay | Admitting: Family Medicine

## 2007-11-21 ENCOUNTER — Encounter: Payer: Self-pay | Admitting: Family Medicine

## 2007-11-21 ENCOUNTER — Telehealth: Payer: Self-pay | Admitting: *Deleted

## 2007-11-21 ENCOUNTER — Ambulatory Visit: Payer: Self-pay | Admitting: Family Medicine

## 2007-11-21 ENCOUNTER — Encounter: Admission: RE | Admit: 2007-11-21 | Discharge: 2007-11-21 | Payer: Self-pay | Admitting: Family Medicine

## 2007-11-21 ENCOUNTER — Inpatient Hospital Stay (HOSPITAL_COMMUNITY): Admission: EM | Admit: 2007-11-21 | Discharge: 2007-11-28 | Payer: Self-pay | Admitting: Emergency Medicine

## 2007-11-24 IMAGING — CT CT HEAD W/O CM
1 series · 16 of 30 positions shown, 20 images · IV contrast (agent unspecified)
Comparison: none

CLINICAL DATA: Dizziness.  
 HEAD CT WITHOUT CONTRAST:
TECHNIQUE: Contiguous axial images were obtained from the base of the skull through the vertex according to standard protocol without contrast.

[Series 2: head routine 4.8 h47s · axial · 0.39mm/px · z∈[-549,-419]mm · 16 of 30 slices shown, 20 images]
[im 2/30  brain]
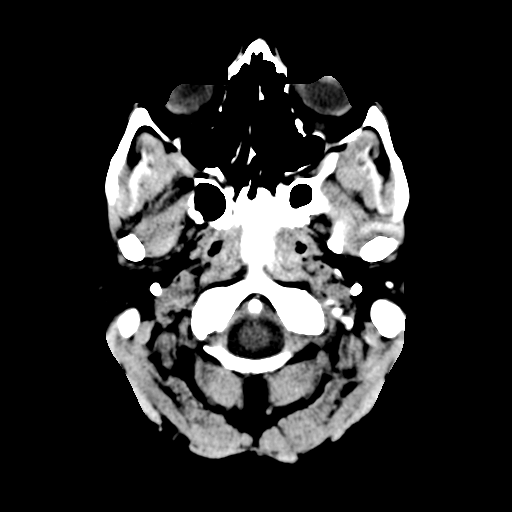
[im 2/30  bone]
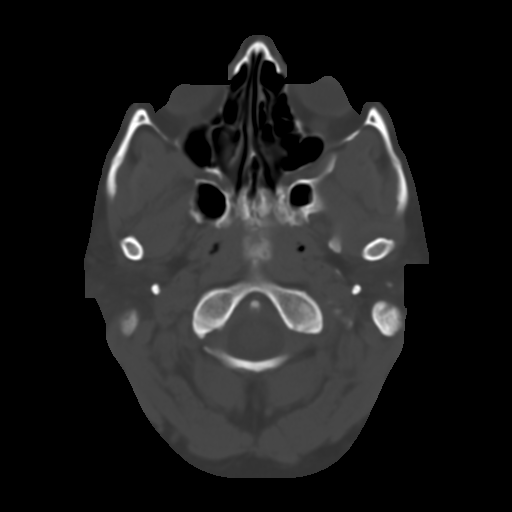
[im 4/30  brain]
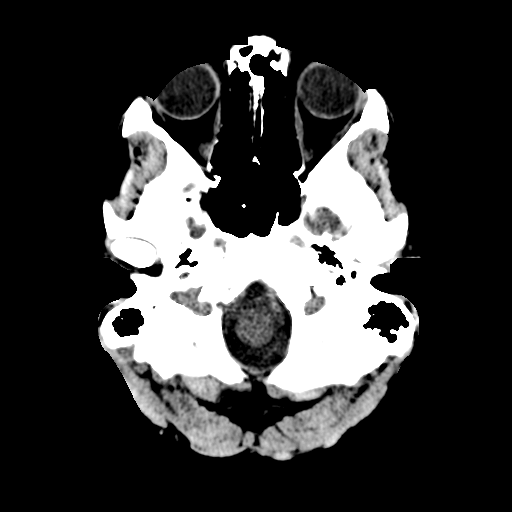
[im 6/30  brain]
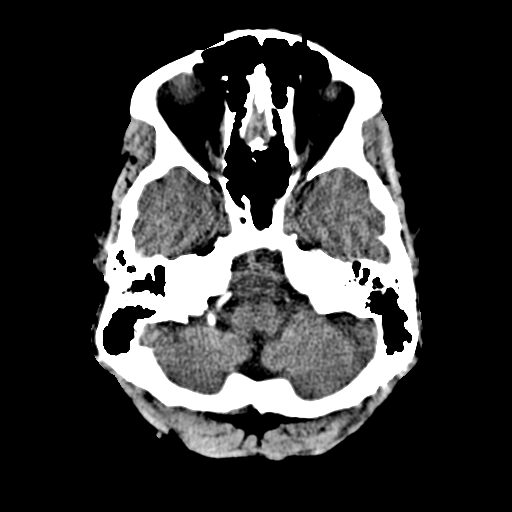
[im 8/30  brain]
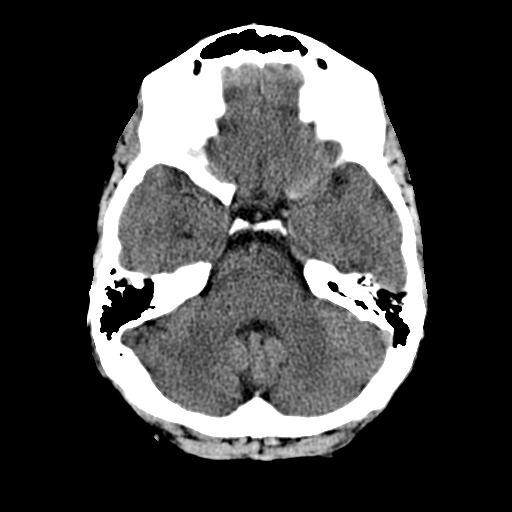
[im 9/30  brain]
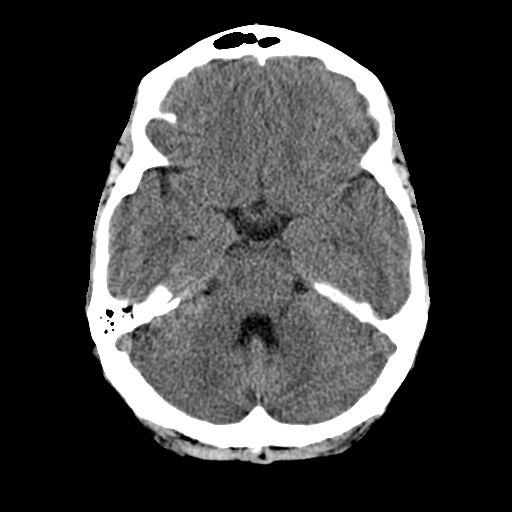
[im 9/30  bone]
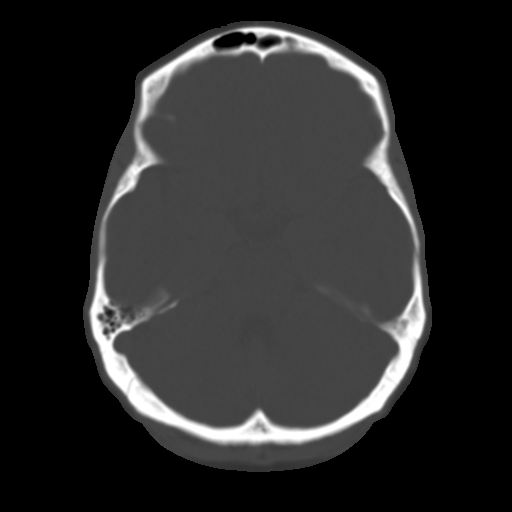
[im 11/30  brain]
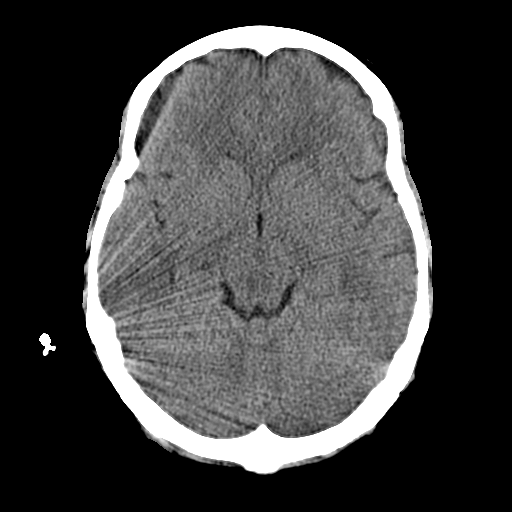
[im 13/30  brain]
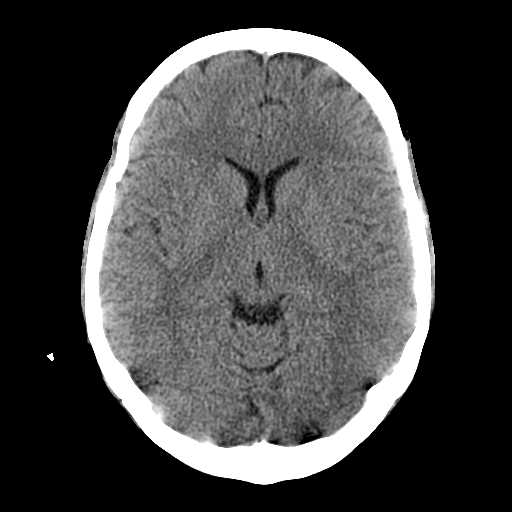
[im 15/30  brain]
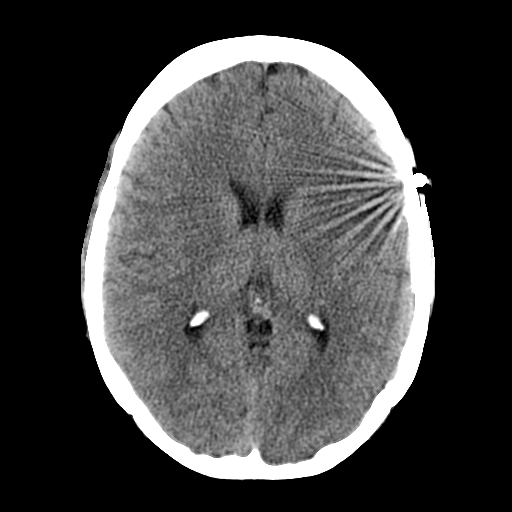
[im 16/30  brain]
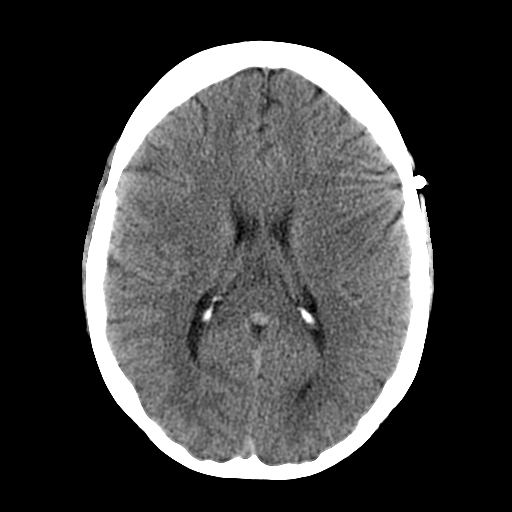
[im 16/30  bone]
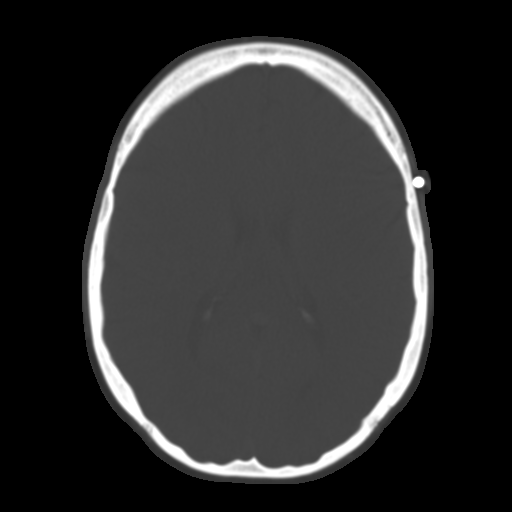
[im 18/30  brain]
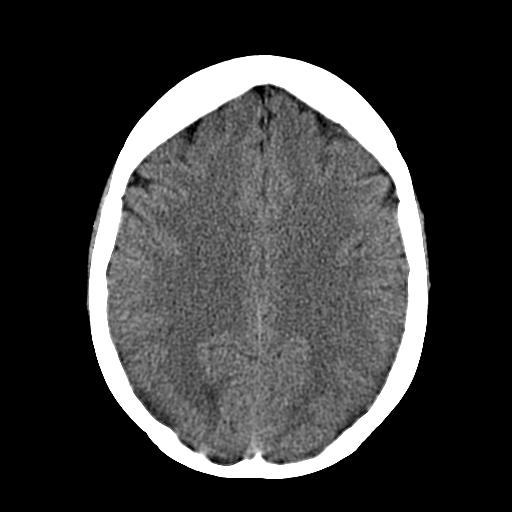
[im 20/30  brain]
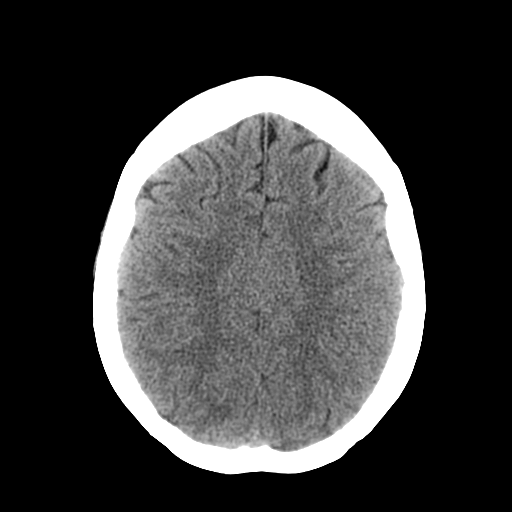
[im 22/30  brain]
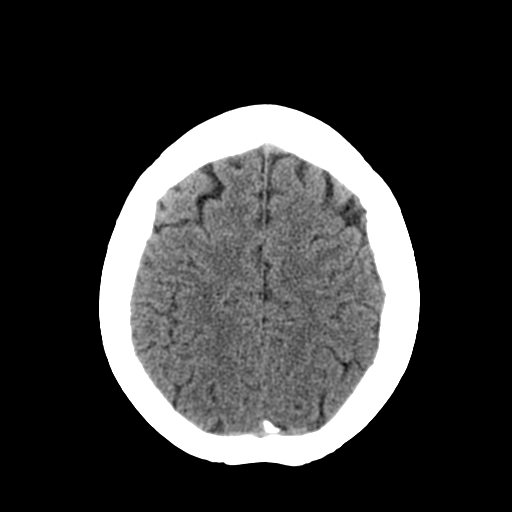
[im 23/30  brain]
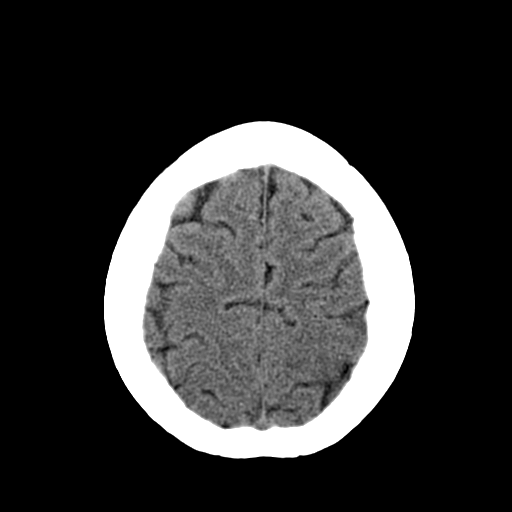
[im 23/30  bone]
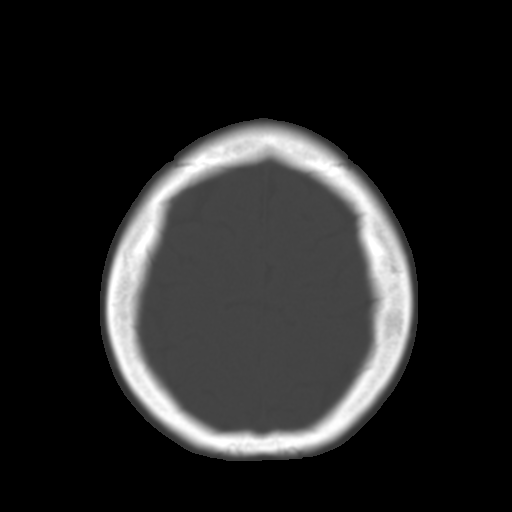
[im 25/30  brain]
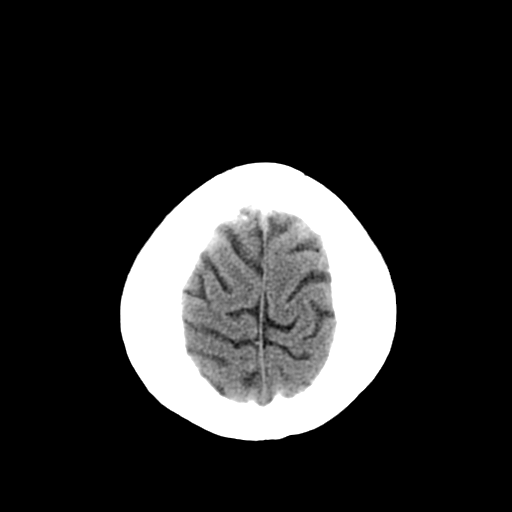
[im 27/30  brain]
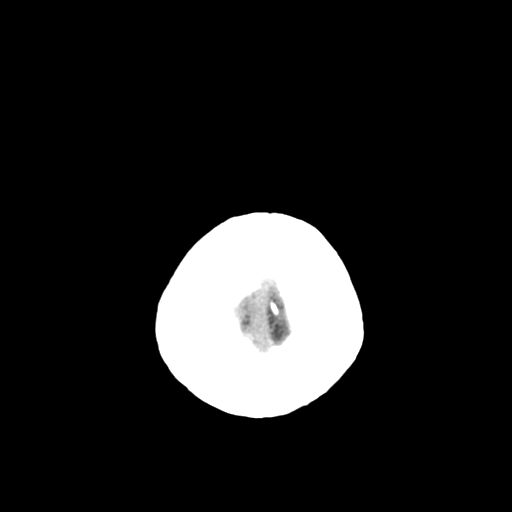
[im 29/30  brain]
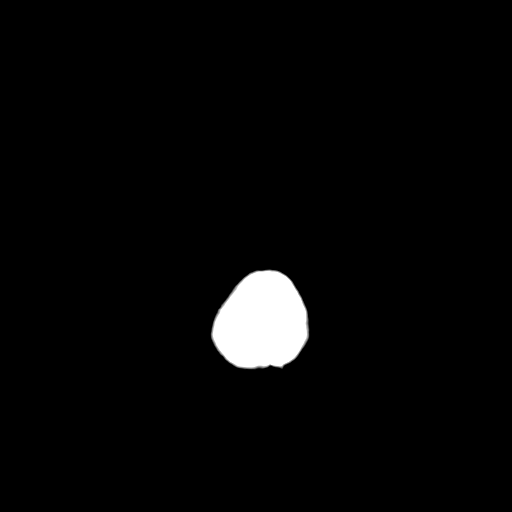

[16 of 30 positions shown; findings below may reference images not displayed]

FINDINGS: No evidence of acute intracranial abnormality.  Specifically, no hemorrhage, hydrocephalus, midline shift, extraaxial fluid collection or evidence of acute stroke.  
 The calvarium is intact.  The orbits are unremarkable.  The paranasal sinuses and mastoid air cells are clear.
IMPRESSION: No evidence for acute intracranial abnormality.

## 2007-12-07 ENCOUNTER — Ambulatory Visit: Payer: Self-pay | Admitting: Family Medicine

## 2007-12-07 ENCOUNTER — Encounter: Payer: Self-pay | Admitting: Family Medicine

## 2007-12-07 LAB — CONVERTED CEMR LAB
BUN: 11 mg/dL (ref 6–23)
Basophils Absolute: 0.1 10*3/uL (ref 0.0–0.1)
Basophils Relative: 1 % (ref 0–1)
Bilirubin Urine: NEGATIVE
CO2: 26 meq/L (ref 19–32)
Calcium: 9.2 mg/dL (ref 8.4–10.5)
Chloride: 97 meq/L (ref 96–112)
Creatinine, Ser: 0.73 mg/dL (ref 0.40–1.20)
Eosinophils Absolute: 0.4 10*3/uL (ref 0.0–0.7)
Eosinophils Relative: 3 % (ref 0–5)
Glucose, Bld: 285 mg/dL — ABNORMAL HIGH (ref 70–99)
Glucose, Urine, Semiquant: 500
HCT: 36 % (ref 36.0–46.0)
Hemoglobin: 11.8 g/dL — ABNORMAL LOW (ref 12.0–15.0)
Lymphocytes Relative: 24 % (ref 12–46)
Lymphs Abs: 3 10*3/uL (ref 0.7–4.0)
MCHC: 32.8 g/dL (ref 30.0–36.0)
MCV: 87 fL (ref 78.0–100.0)
Monocytes Absolute: 0.8 10*3/uL (ref 0.1–1.0)
Monocytes Relative: 7 % (ref 3–12)
Neutro Abs: 8.3 10*3/uL — ABNORMAL HIGH (ref 1.7–7.7)
Neutrophils Relative %: 66 % (ref 43–77)
Nitrite: NEGATIVE
Platelets: 502 10*3/uL — ABNORMAL HIGH (ref 150–400)
Potassium: 4.2 meq/L (ref 3.5–5.3)
Protein, U semiquant: 100
RBC: 4.14 M/uL (ref 3.87–5.11)
RDW: 14.4 % (ref 11.5–15.5)
Sodium: 134 meq/L — ABNORMAL LOW (ref 135–145)
Specific Gravity, Urine: 1.025
Urobilinogen, UA: 0.2
WBC Urine, dipstick: NEGATIVE
WBC: 12.6 10*3/uL — ABNORMAL HIGH (ref 4.0–10.5)
pH: 6

## 2007-12-13 ENCOUNTER — Encounter: Payer: Self-pay | Admitting: Family Medicine

## 2007-12-13 ENCOUNTER — Ambulatory Visit: Payer: Self-pay | Admitting: Internal Medicine

## 2007-12-13 ENCOUNTER — Ambulatory Visit: Payer: Self-pay | Admitting: Cardiology

## 2007-12-13 ENCOUNTER — Inpatient Hospital Stay (HOSPITAL_COMMUNITY): Admission: EM | Admit: 2007-12-13 | Discharge: 2007-12-19 | Payer: Self-pay | Admitting: Emergency Medicine

## 2007-12-14 ENCOUNTER — Encounter (INDEPENDENT_AMBULATORY_CARE_PROVIDER_SITE_OTHER): Payer: Self-pay | Admitting: Internal Medicine

## 2007-12-17 ENCOUNTER — Encounter: Payer: Self-pay | Admitting: Family Medicine

## 2007-12-20 ENCOUNTER — Telehealth: Payer: Self-pay | Admitting: Family Medicine

## 2007-12-24 ENCOUNTER — Ambulatory Visit: Payer: Self-pay | Admitting: Family Medicine

## 2007-12-26 ENCOUNTER — Encounter: Admission: RE | Admit: 2007-12-26 | Discharge: 2007-12-26 | Payer: Self-pay | Admitting: Urology

## 2008-02-15 ENCOUNTER — Telehealth: Payer: Self-pay | Admitting: *Deleted

## 2008-03-07 ENCOUNTER — Ambulatory Visit: Payer: Self-pay | Admitting: Family Medicine

## 2008-03-07 LAB — CONVERTED CEMR LAB
Bilirubin Urine: NEGATIVE
Blood in Urine, dipstick: NEGATIVE
Glucose, Urine, Semiquant: >=1000
Nitrite: NEGATIVE
Protein, U semiquant: 30
Specific Gravity, Urine: >=1.03
Urobilinogen, UA: 0.2
WBC Urine, dipstick: NEGATIVE
pH: 5.5

## 2008-03-14 ENCOUNTER — Emergency Department (HOSPITAL_COMMUNITY): Admission: EM | Admit: 2008-03-14 | Discharge: 2008-03-14 | Payer: Self-pay | Admitting: Emergency Medicine

## 2008-03-28 ENCOUNTER — Ambulatory Visit: Payer: Self-pay | Admitting: Family Medicine

## 2008-05-09 ENCOUNTER — Ambulatory Visit: Payer: Self-pay | Admitting: Family Medicine

## 2008-05-09 LAB — CONVERTED CEMR LAB: Hgb A1c MFr Bld: 11.3 %

## 2008-05-31 IMAGING — CR DG CHEST 2V
2 series · 2 of 2 positions shown · non-contrast
Comparison: 01/21/06.

CLINICAL DATA: Chest pain.  ER patient. 
 CHEST - 2 VIEW:

[w chest pa]
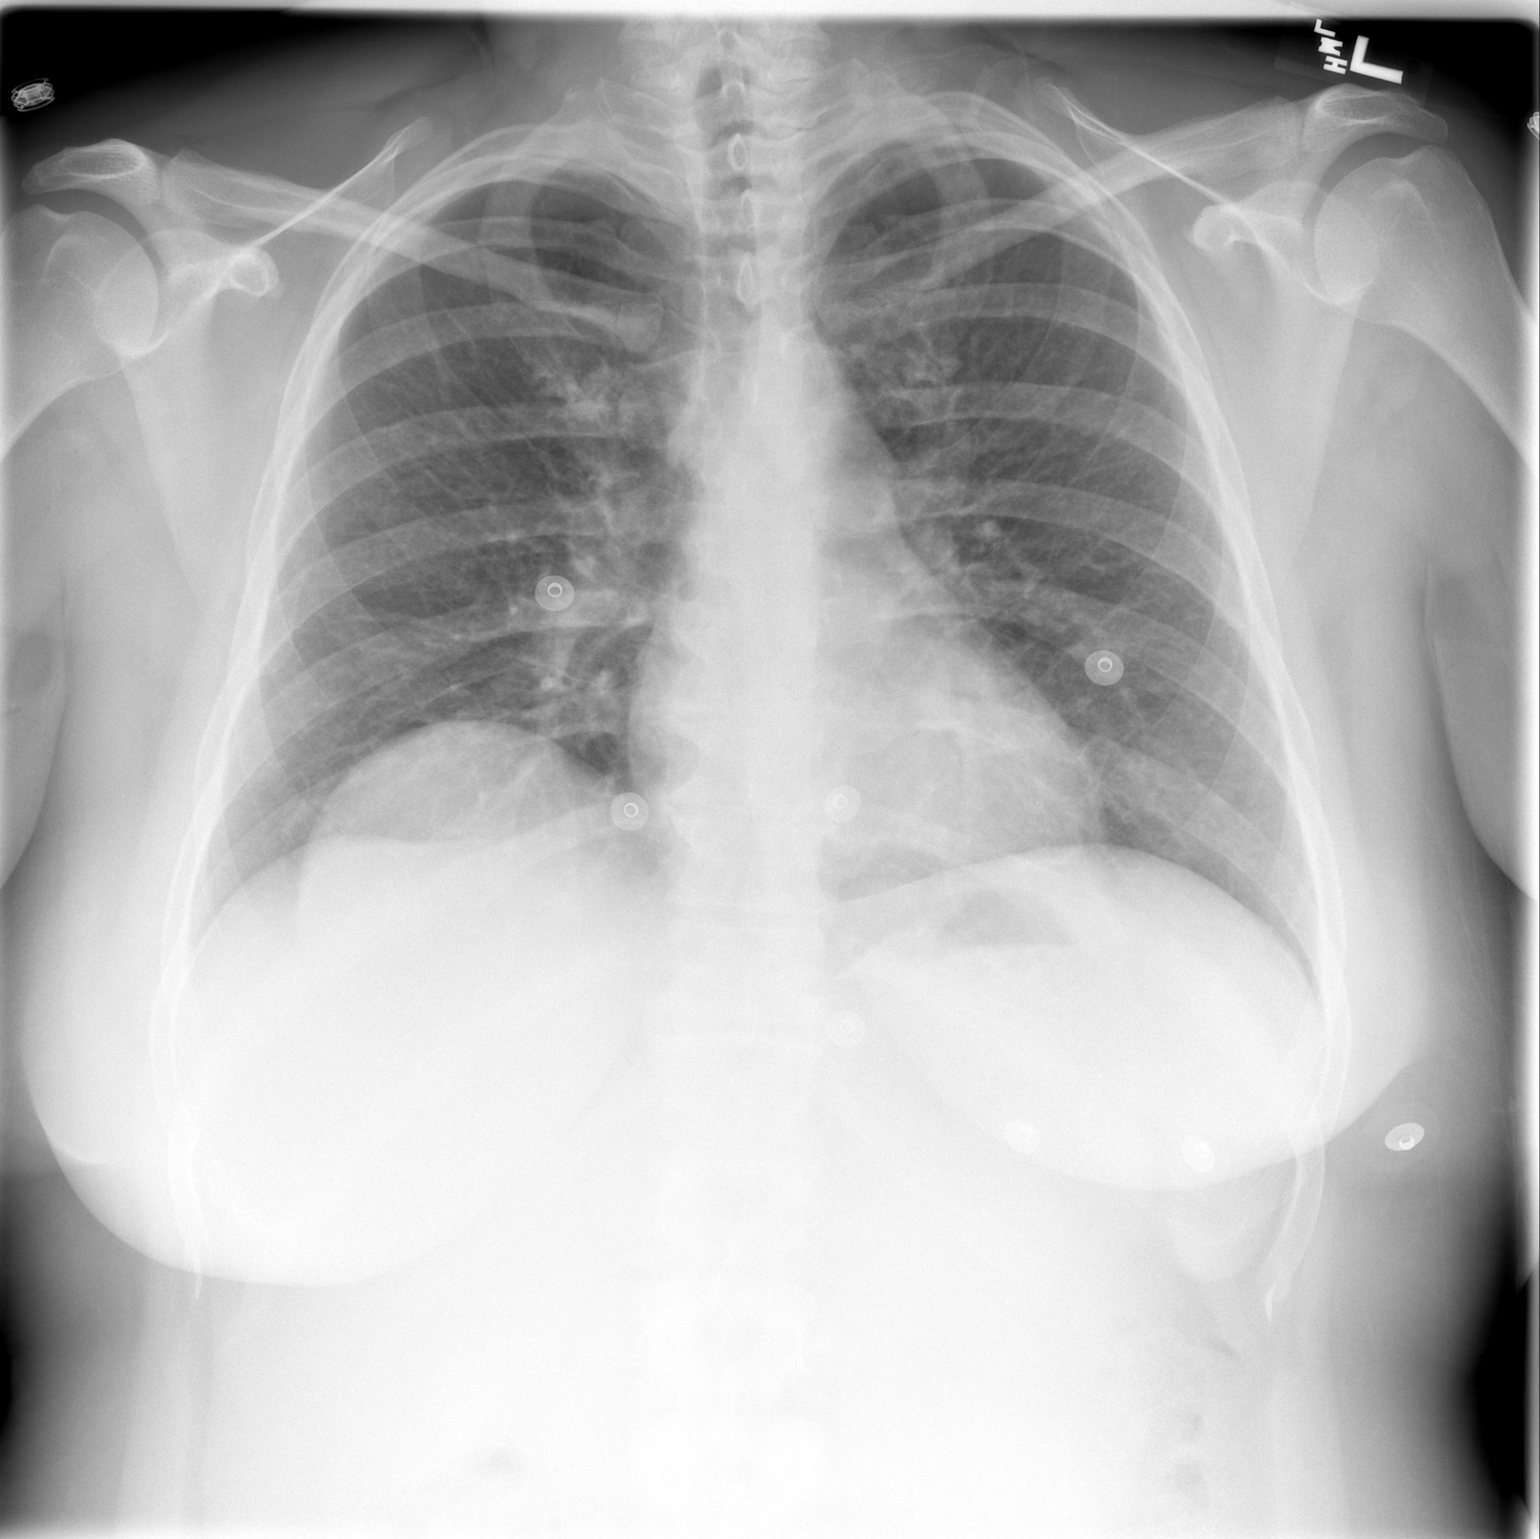

[w chest lat]
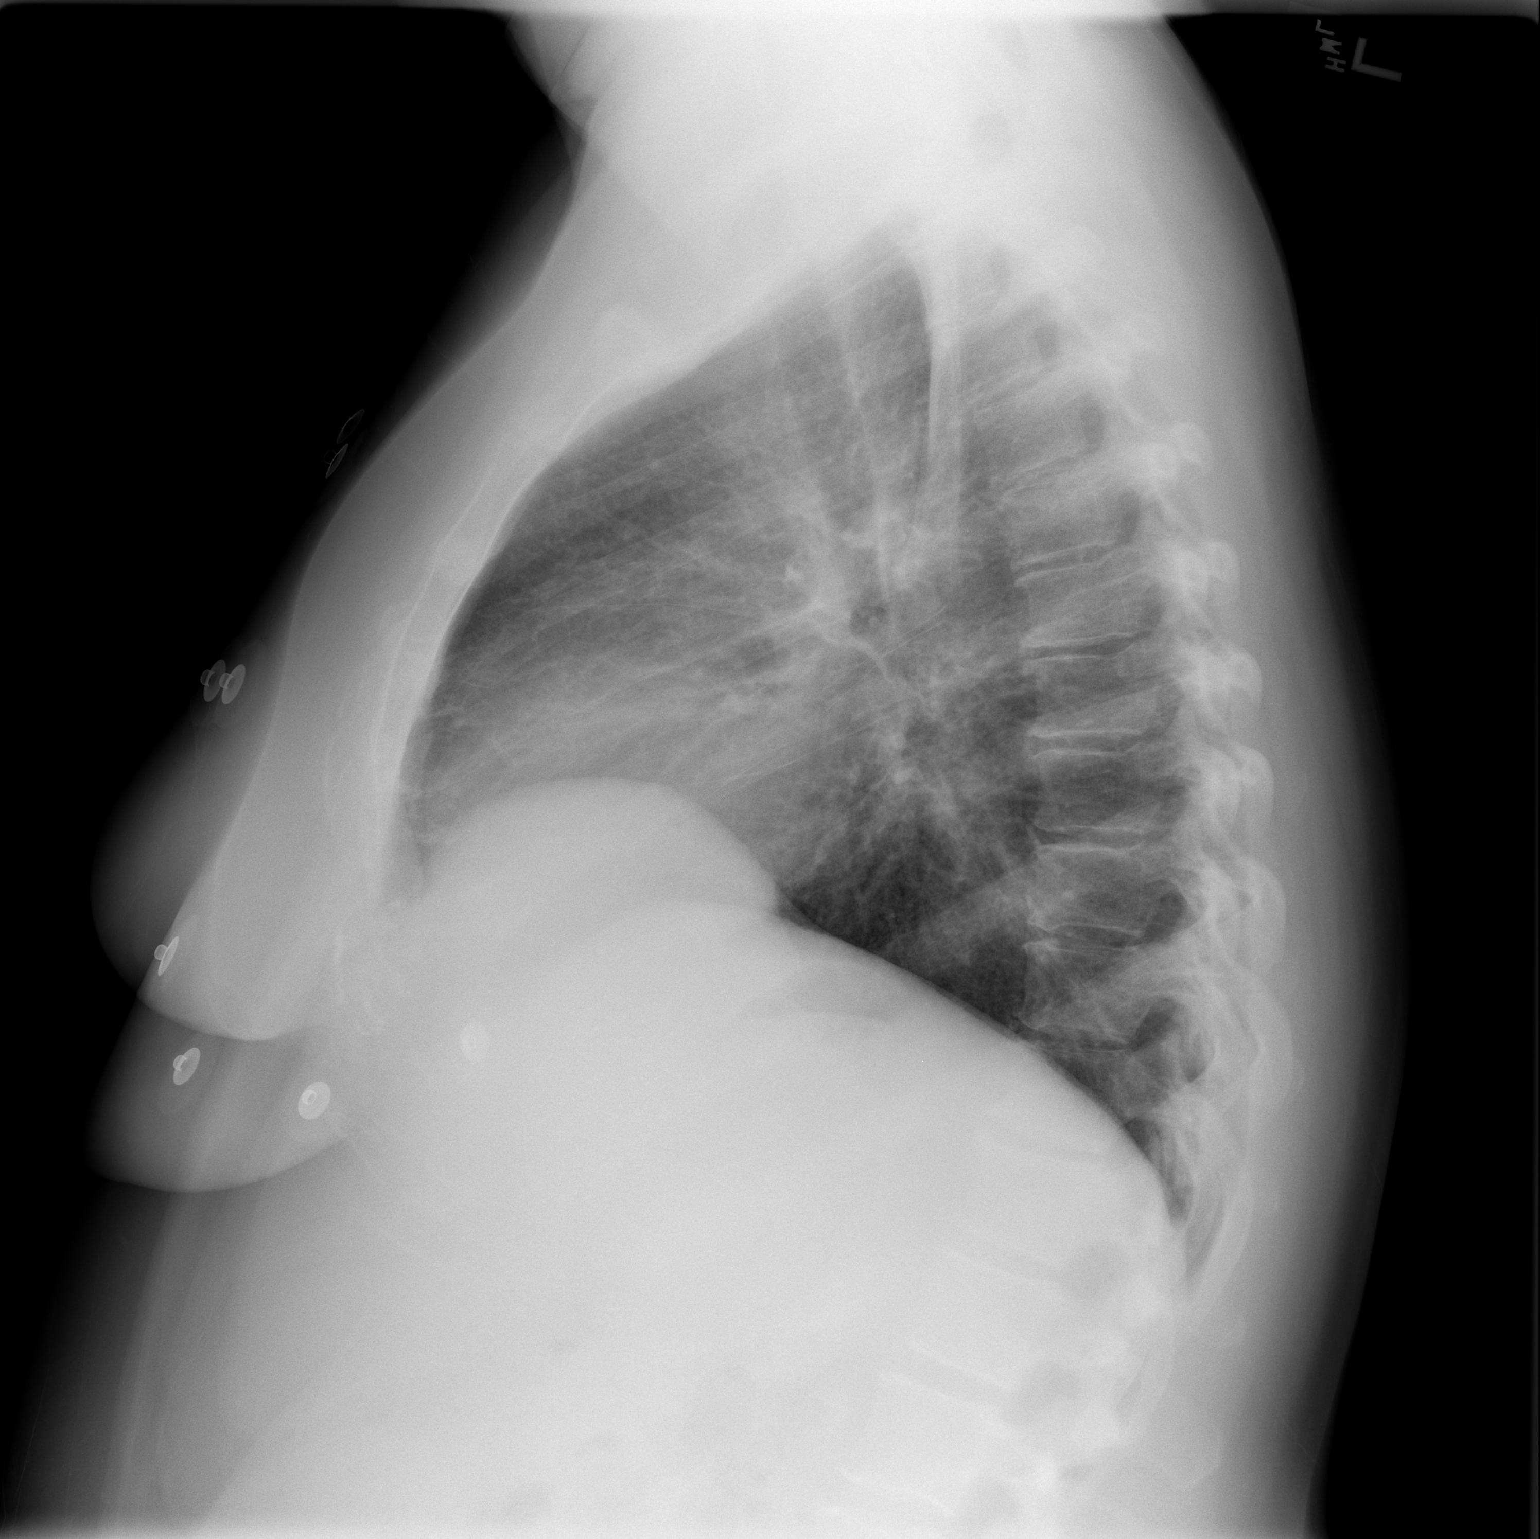

[2 of 2 positions shown; findings below may reference images not displayed]

FINDINGS: Normal cardiac size and shape.  No CHF.  Accentuation of peribronchial markings with bronchial wall thickening noted bilaterally.  Focal eventration of the right hemidiaphragm as an incidental finding.  Mild degenerative changes of the thoracic spine.
IMPRESSION: No acute chest findings. Chronic bronchitic changes are again noted and have increased slightly.

## 2008-06-16 ENCOUNTER — Ambulatory Visit: Payer: Self-pay | Admitting: Family Medicine

## 2008-07-14 ENCOUNTER — Ambulatory Visit: Payer: Self-pay | Admitting: Family Medicine

## 2008-07-14 ENCOUNTER — Encounter: Payer: Self-pay | Admitting: Family Medicine

## 2008-07-14 ENCOUNTER — Ambulatory Visit (HOSPITAL_COMMUNITY): Admission: RE | Admit: 2008-07-14 | Discharge: 2008-07-14 | Payer: Self-pay | Admitting: Family Medicine

## 2008-07-15 ENCOUNTER — Encounter: Payer: Self-pay | Admitting: Family Medicine

## 2008-07-15 ENCOUNTER — Ambulatory Visit: Payer: Self-pay | Admitting: Internal Medicine

## 2008-07-15 LAB — CONVERTED CEMR LAB
CK-MB: 1.1 ng/mL (ref 0.3–4.0)
TSH: 0.675 microintl units/mL (ref 0.350–4.50)
Total CK: 54 units/L (ref 7–177)
Troponin I: 0.01 ng/mL (ref <0.06)

## 2008-07-18 ENCOUNTER — Ambulatory Visit: Payer: Self-pay

## 2008-07-21 ENCOUNTER — Telehealth: Payer: Self-pay | Admitting: Family Medicine

## 2008-07-22 ENCOUNTER — Ambulatory Visit: Payer: Self-pay | Admitting: Internal Medicine

## 2008-07-22 LAB — CONVERTED CEMR LAB
BUN: 10 mg/dL (ref 6–23)
Basophils Absolute: 0 10*3/uL (ref 0.0–0.1)
Basophils Relative: 0.2 % (ref 0.0–3.0)
CO2: 26 meq/L (ref 19–32)
Calcium: 9.2 mg/dL (ref 8.4–10.5)
Chloride: 99 meq/L (ref 96–112)
Creatinine, Ser: 0.7 mg/dL (ref 0.4–1.2)
Eosinophils Absolute: 0.4 10*3/uL (ref 0.0–0.7)
Eosinophils Relative: 2.9 % (ref 0.0–5.0)
GFR calc Af Amer: 120 mL/min
GFR calc non Af Amer: 99 mL/min
Glucose, Bld: 465 mg/dL — ABNORMAL HIGH (ref 70–99)
HCT: 43.5 % (ref 36.0–46.0)
Hemoglobin: 15 g/dL (ref 12.0–15.0)
Lymphocytes Relative: 14.4 % (ref 12.0–46.0)
MCHC: 34.5 g/dL (ref 30.0–36.0)
MCV: 87.1 fL (ref 78.0–100.0)
Magnesium: 1.9 mg/dL (ref 1.5–2.5)
Monocytes Absolute: 0.4 10*3/uL (ref 0.1–1.0)
Monocytes Relative: 2.8 % — ABNORMAL LOW (ref 3.0–12.0)
Neutro Abs: 10.2 10*3/uL — ABNORMAL HIGH (ref 1.4–7.7)
Neutrophils Relative %: 79.7 % — ABNORMAL HIGH (ref 43.0–77.0)
Platelets: 244 10*3/uL (ref 150–400)
Potassium: 4.4 meq/L (ref 3.5–5.1)
RBC: 4.99 M/uL (ref 3.87–5.11)
RDW: 13.3 % (ref 11.5–14.6)
Sodium: 132 meq/L — ABNORMAL LOW (ref 135–145)
TSH: 0.82 microintl units/mL (ref 0.35–5.50)
WBC: 12.8 10*3/uL — ABNORMAL HIGH (ref 4.5–10.5)

## 2008-07-24 ENCOUNTER — Ambulatory Visit: Payer: Self-pay | Admitting: Internal Medicine

## 2008-07-25 ENCOUNTER — Ambulatory Visit: Payer: Self-pay | Admitting: Cardiovascular Disease

## 2008-07-25 ENCOUNTER — Inpatient Hospital Stay (HOSPITAL_COMMUNITY): Admission: EM | Admit: 2008-07-25 | Discharge: 2008-07-28 | Payer: Self-pay | Admitting: Emergency Medicine

## 2008-07-29 ENCOUNTER — Telehealth: Payer: Self-pay | Admitting: Family Medicine

## 2008-08-01 ENCOUNTER — Ambulatory Visit: Payer: Self-pay | Admitting: Family Medicine

## 2008-08-07 ENCOUNTER — Ambulatory Visit: Payer: Self-pay | Admitting: Family Medicine

## 2008-08-14 ENCOUNTER — Telehealth: Payer: Self-pay | Admitting: Family Medicine

## 2008-08-17 IMAGING — US US ABDOMEN COMPLETE
1 series · 13 of 25 positions shown · non-contrast
Comparison: None

CLINICAL DATA: Right-sided abdominal pain.

ABDOMEN ULTRASOUND
TECHNIQUE: Complete abdominal ultrasound examination was performed
including evaluation of the liver, gallbladder, bile ducts,
pancreas, kidneys, spleen, IVC, and abdominal aorta.

[Series 1: unknown · 0.40mm/px · 13 of 56 slices shown]
[im 1/56]
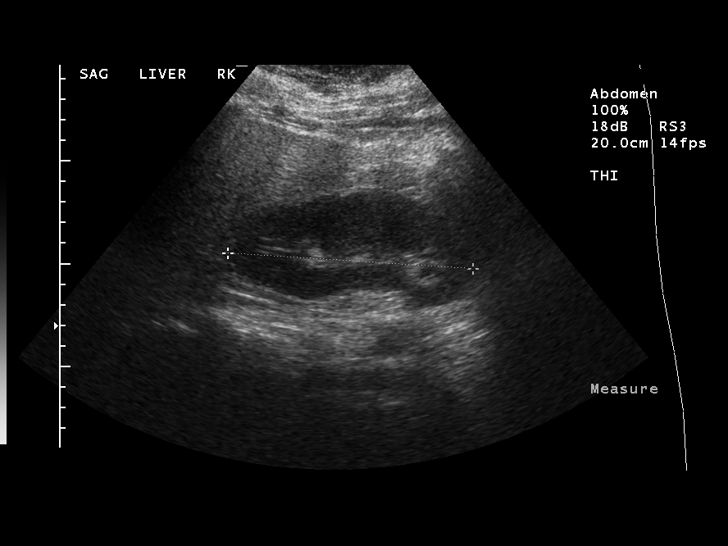
[im 5/56]
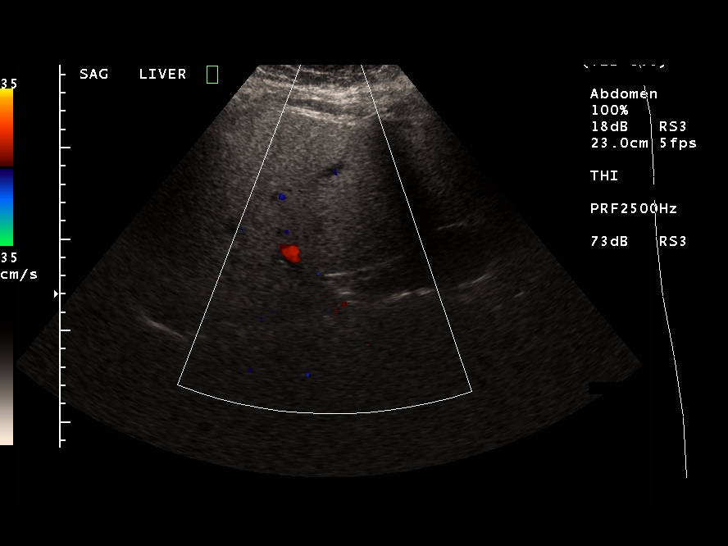
[im 10/56]
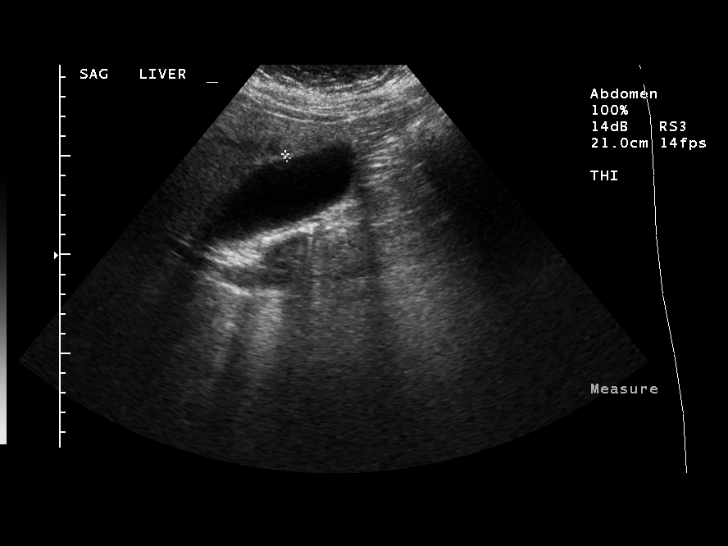
[im 14/56]
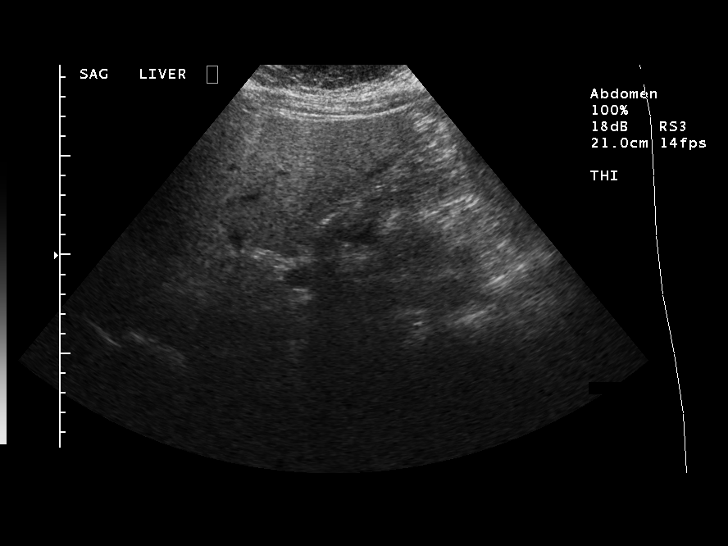
[im 19/56]
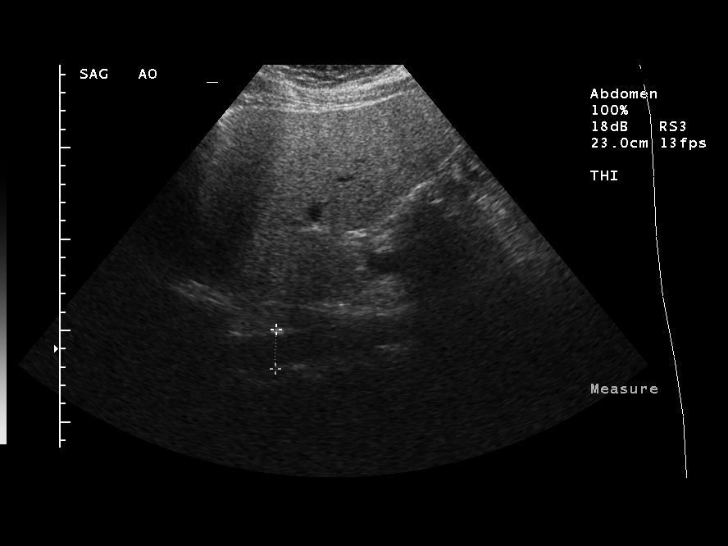
[im 23/56]
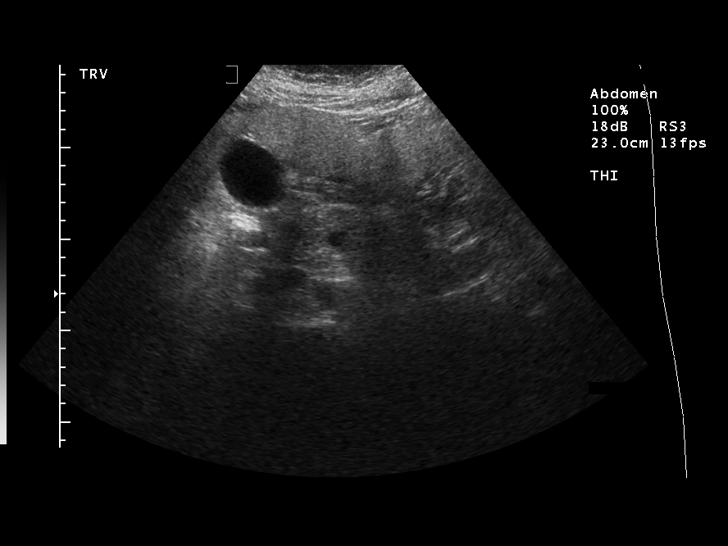
[im 28/56]
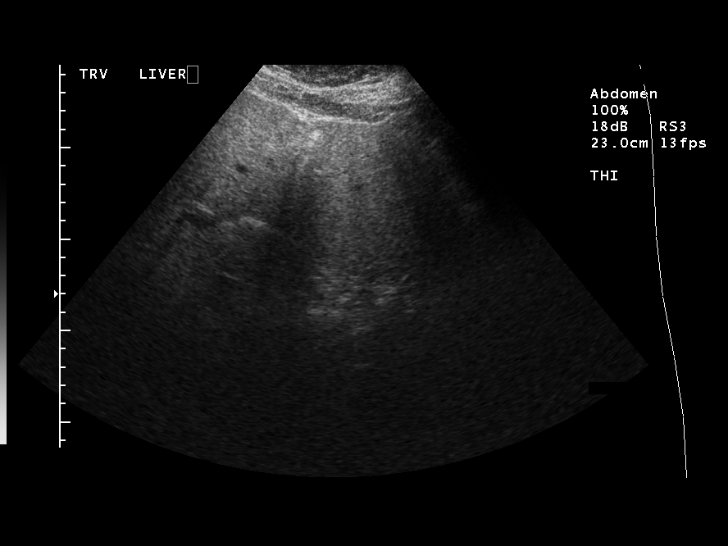
[im 33/56]
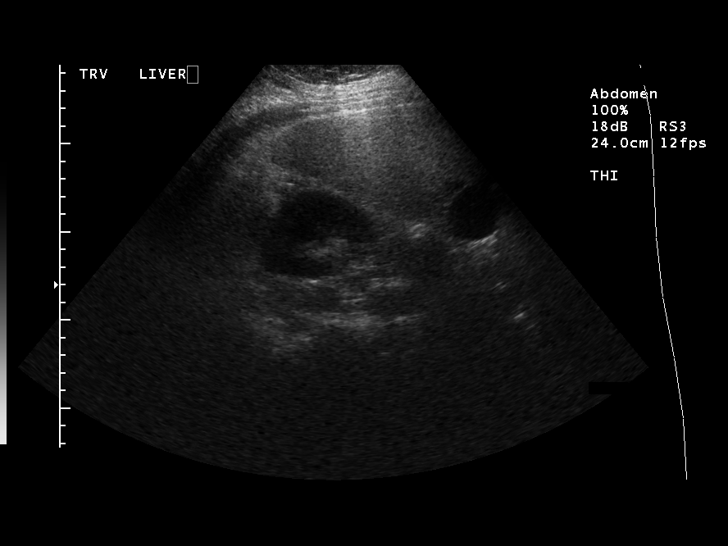
[im 37/56]
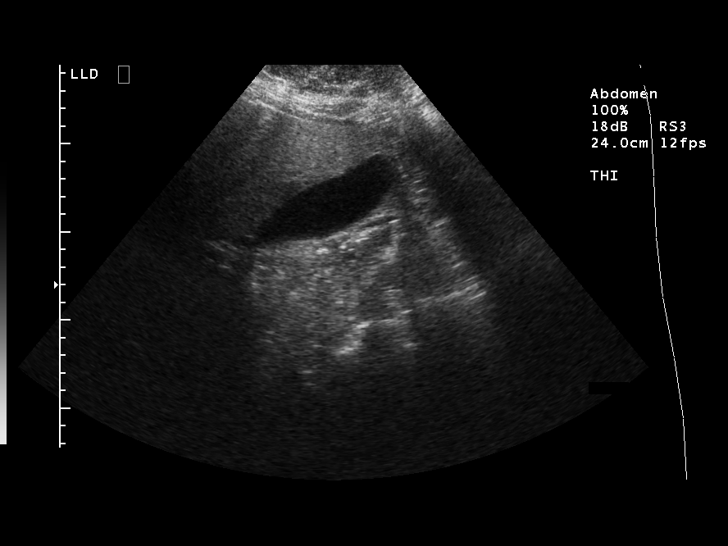
[im 42/56]
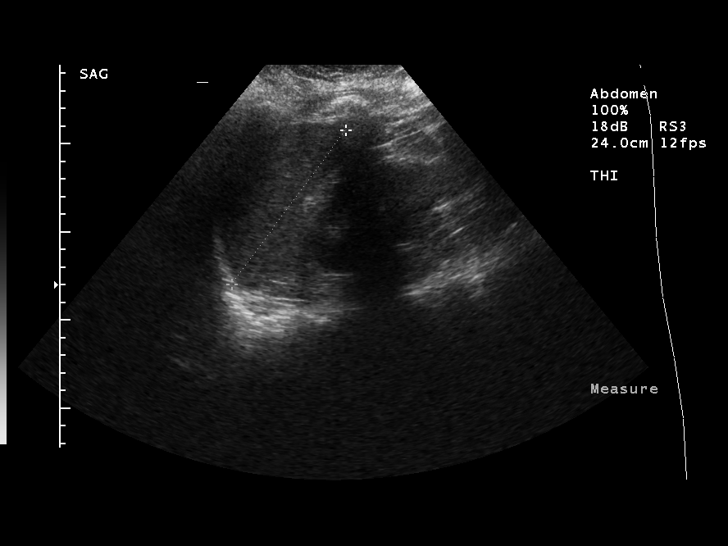
[im 46/56]
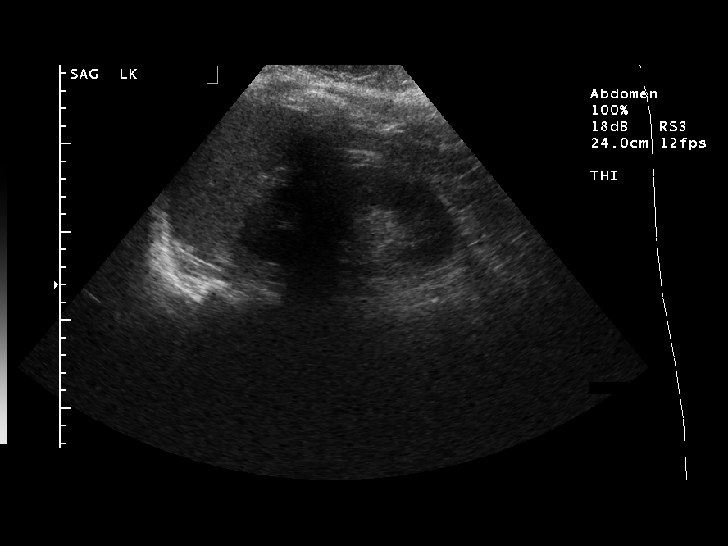
[im 51/56]
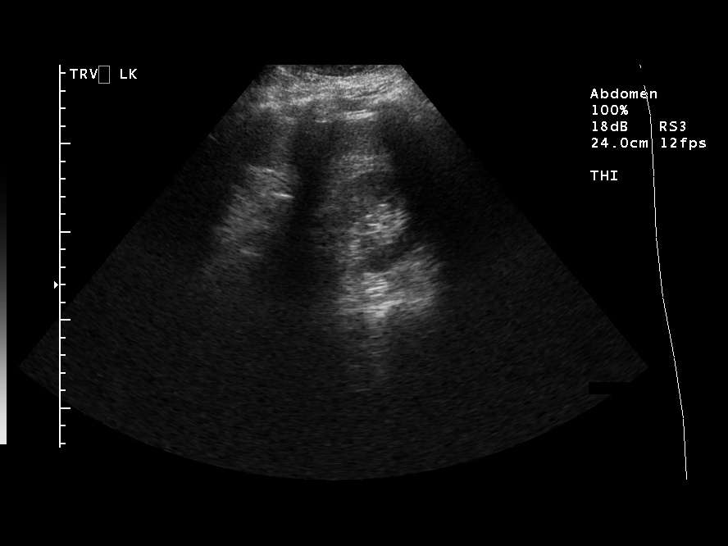
[im 56/56]
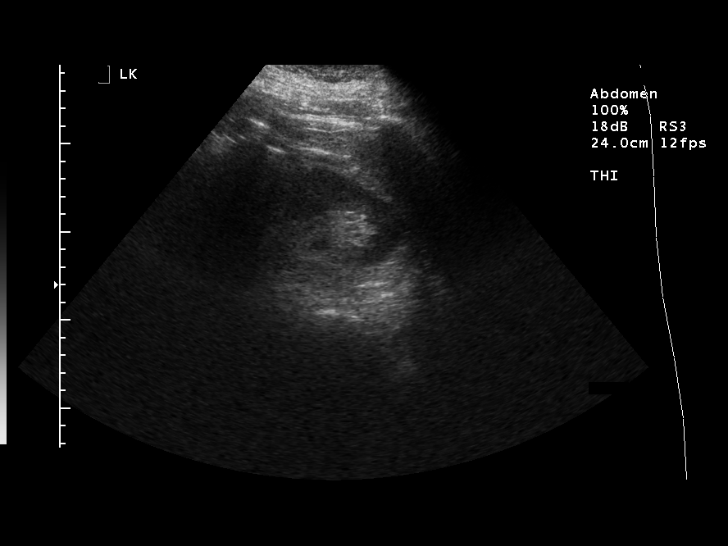

[13 of 25 positions shown; findings below may reference images not displayed]

FINDINGS: The pancreas and the distal abdominal aorta cannot be
well seen due to overlying bowel gas.

The right kidney measures 12.3 cm in greatest length and the left
kidney measures 13.1 cm in greatest length.  No renal stones, mass,
or hydronephrosis identified. The visualized upper portion of the
IVC appears unremarkable.

The liver is echogenic suggesting diffuse fatty infiltration.  The
gallbladder appears normal, without evidence of cholelithiasis.
Sonographic Murphy's sign is absent.

The  common bile duct measures 5 mm in diameter, at the upper
limits of normal.  No directly visualized choledocholithiasis.

The spleen appears normal sonographically.
IMPRESSION: 1.  Fatty infiltration of the liver is suspected.
2.  The common bile duct measures 5 mm in diameter, at the upper
limits of normal in size.
3.  The pancreas and distal most abdominal aorta cannot be well
seen due to overlying bowel gas.
4.  Otherwise negative.

## 2008-08-20 ENCOUNTER — Ambulatory Visit: Payer: Self-pay | Admitting: Family Medicine

## 2008-08-20 ENCOUNTER — Encounter (INDEPENDENT_AMBULATORY_CARE_PROVIDER_SITE_OTHER): Payer: Self-pay | Admitting: *Deleted

## 2008-08-20 IMAGING — CR DG THORACIC SPINE 3V
3 series · 3 of 3 positions shown · non-contrast
Comparison: None

CLINICAL DATA: Back pain with radiculopathy.

THORACIC SPINE - 2 VIEW + SWIMMERS

[view not recorded (1 of 3)]
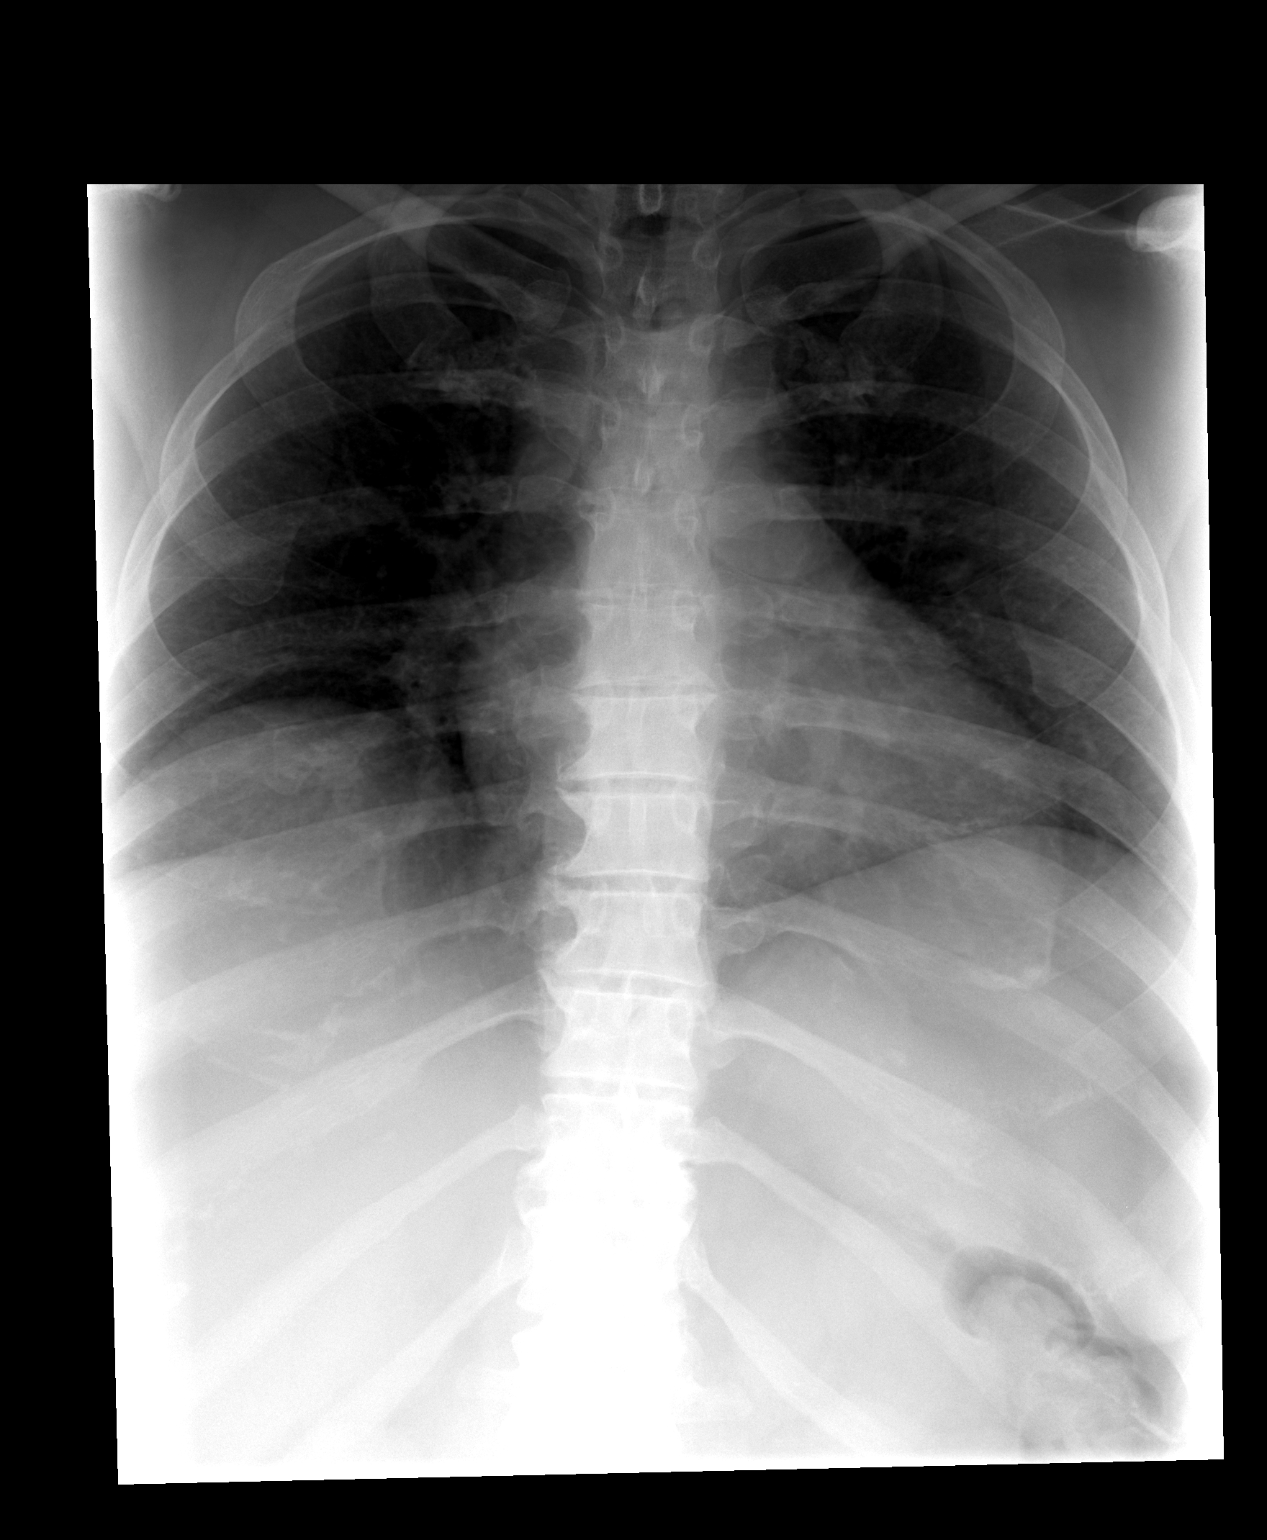

[view not recorded (2 of 3)]
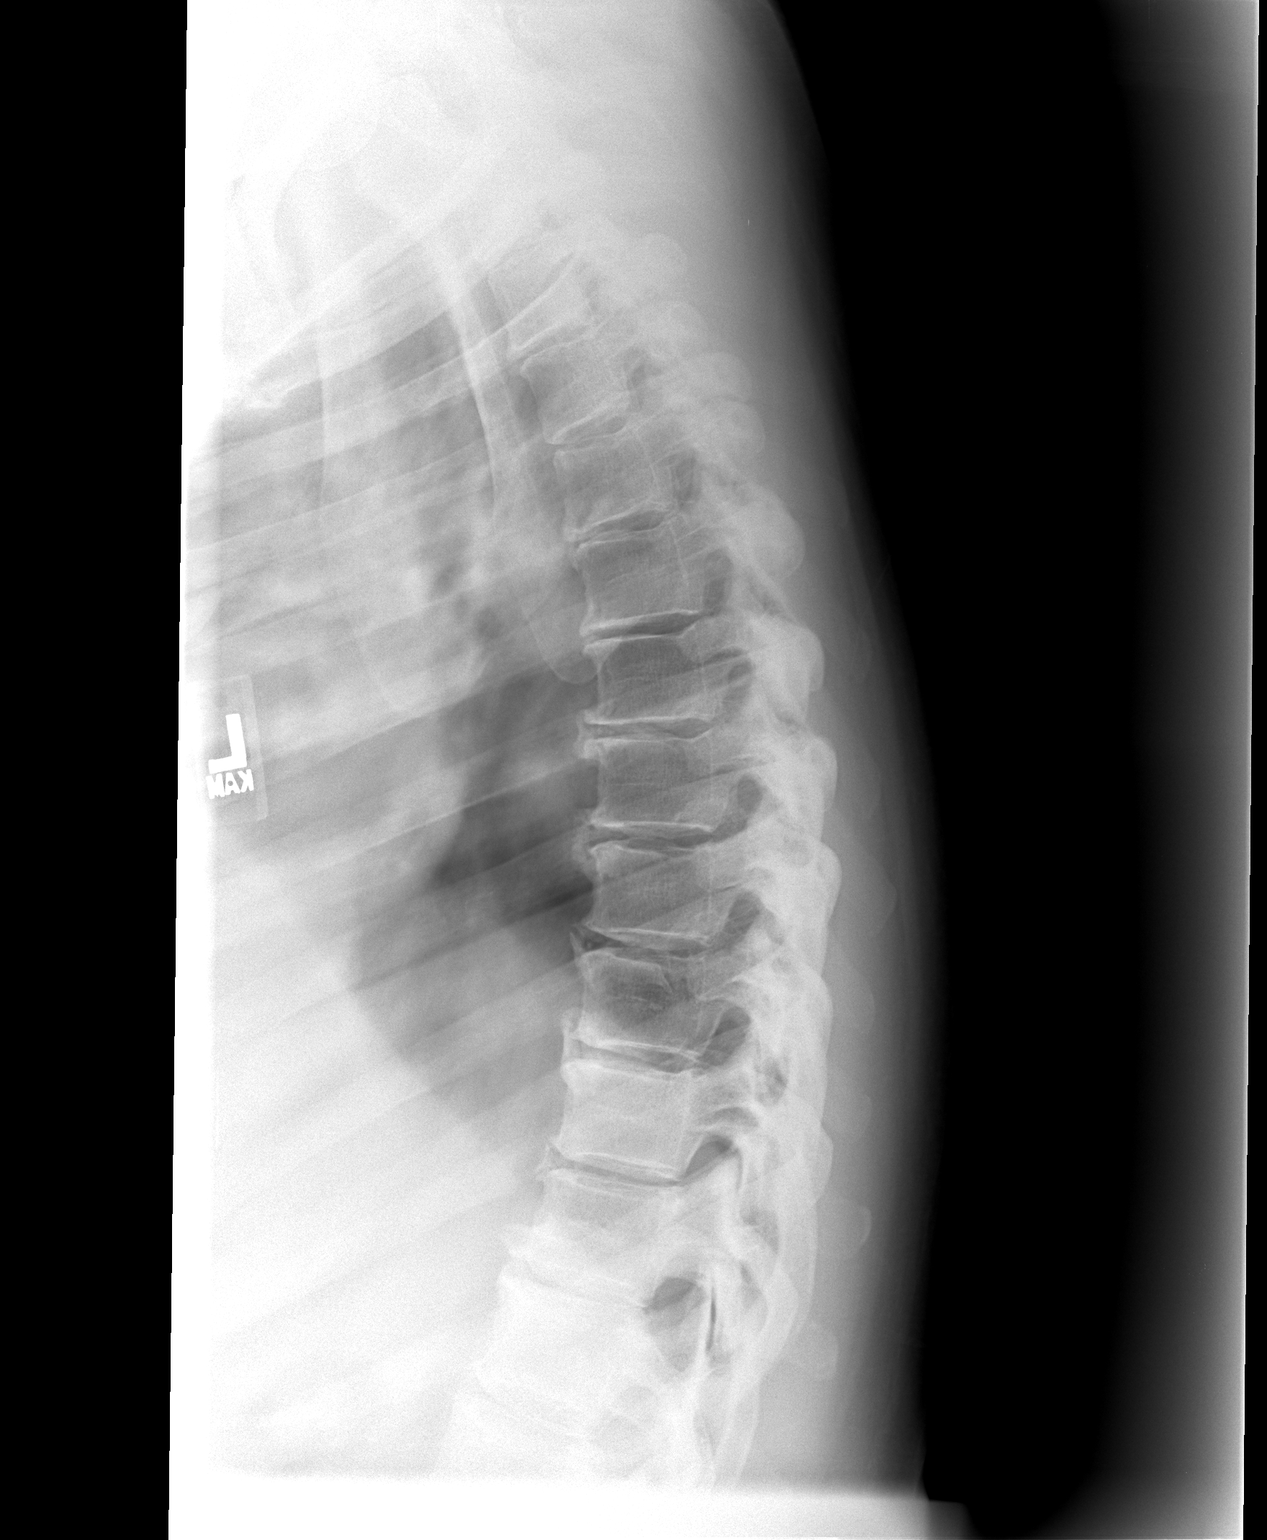

[view not recorded (3 of 3)]
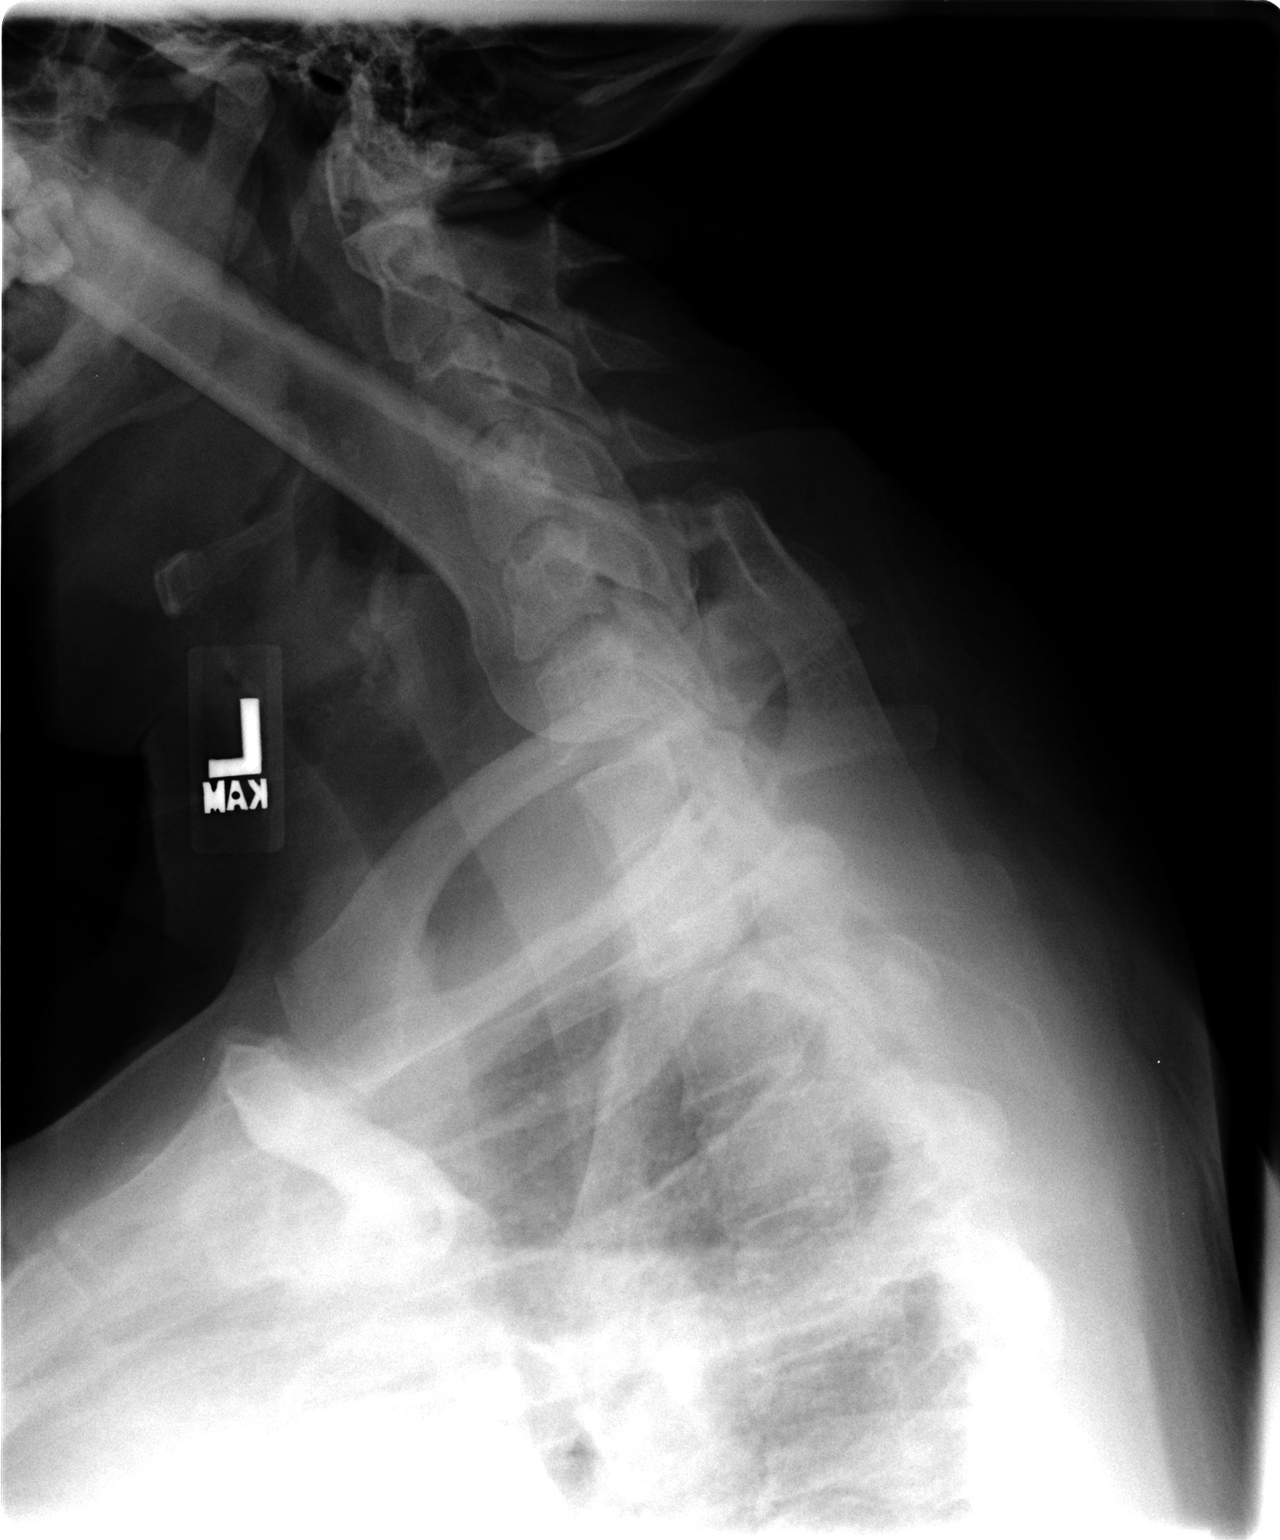

[3 of 3 positions shown; findings below may reference images not displayed]

FINDINGS: Two-view exam of the thoracic spine shows no evidence for
fracture.  There is anterior paravertebral spurring at multiple
levels in the thoracic spine.  Bony alignment is anatomic.
IMPRESSION: Anterior paravertebral spurring without evidence for fracture or
subluxation.

## 2008-08-20 IMAGING — CT CT ABDOMEN W/ CM
2 of 5 series · 13 of 32 positions shown, 18 images · IV contrast (agent unspecified)
Comparison: None

CT ABDOMEN

CLINICAL DATA: Right sided abdominal pain

CT ABDOMEN AND PELVIS WITH CONTRAST
TECHNIQUE: Multidetector CT imaging of the abdomen and pelvis was
performed using the standard protocol following bolus
administration of intravenous contrast.
Contrast: 100 ml Hmnipaque-Y00

[Series 2: routine abdomen · axial · 0.97mm/px · z∈[-411,-76]mm · 5 of 101 slices shown, 10 images]
[im 17/101  soft-tissue]
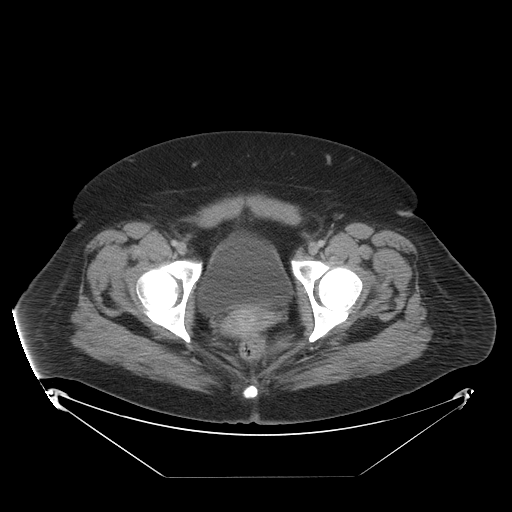
[im 17/101  bone]
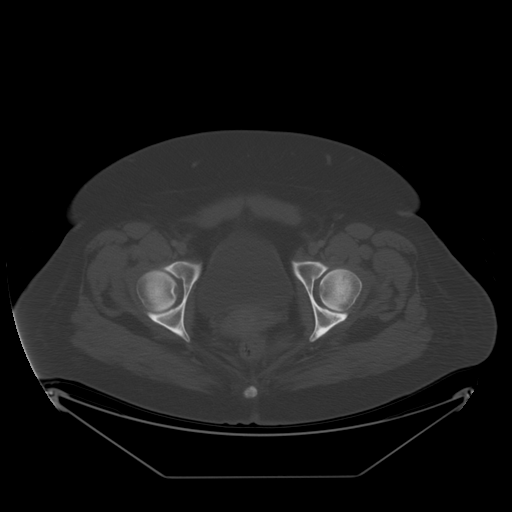
[im 34/101  soft-tissue]
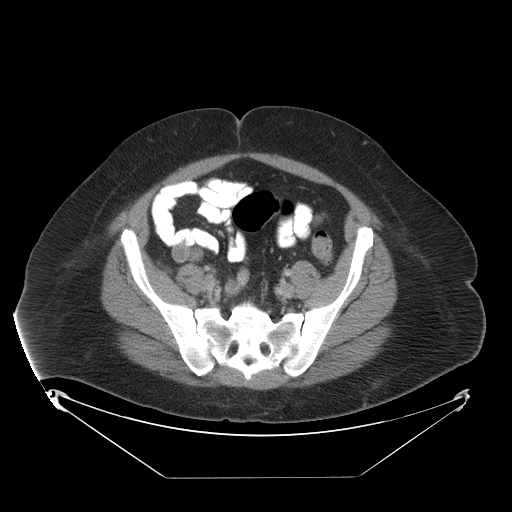
[im 34/101  lung]
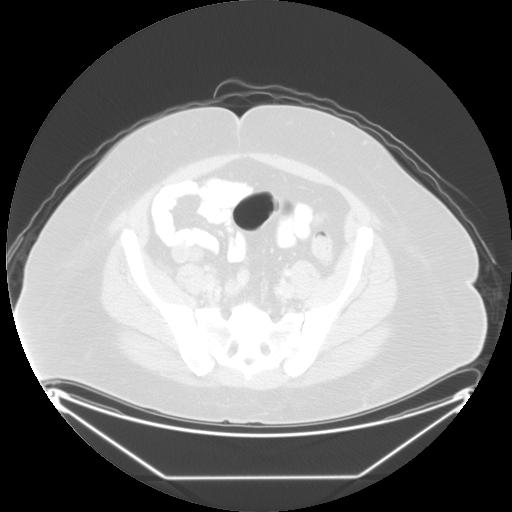
[im 51/101  soft-tissue]
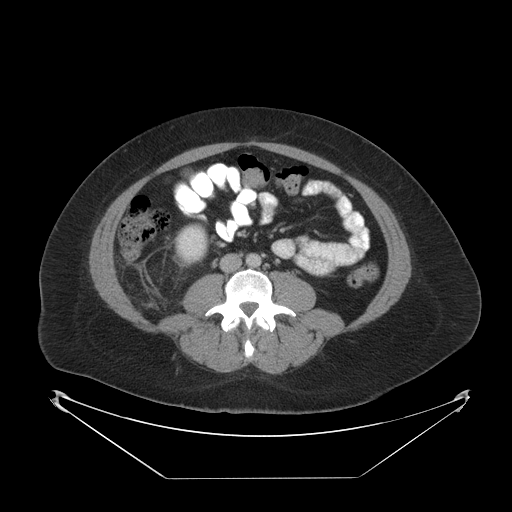
[im 51/101  lung]
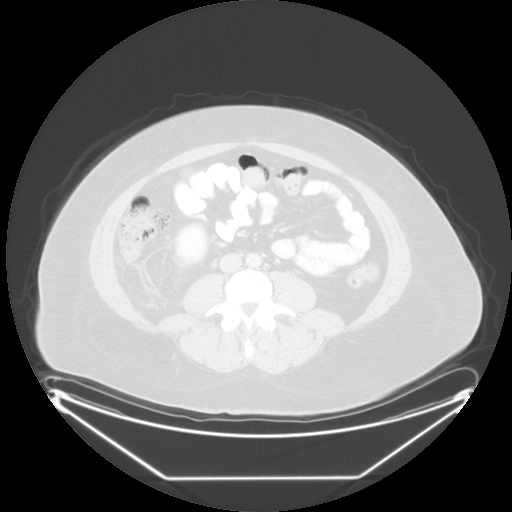
[im 67/101  soft-tissue]
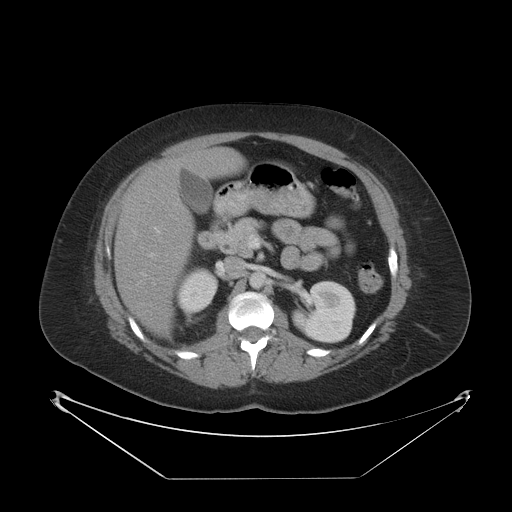
[im 67/101  lung]
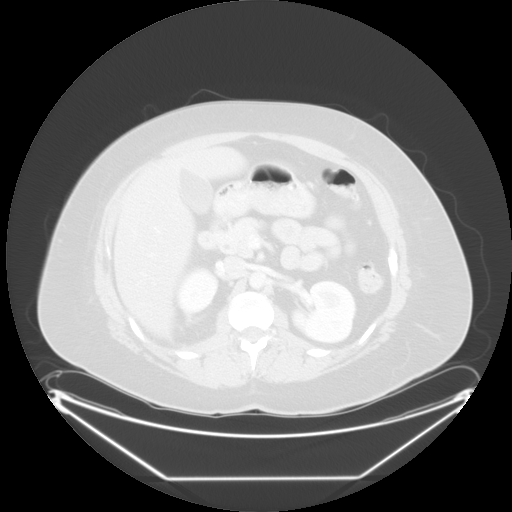
[im 84/101  soft-tissue]
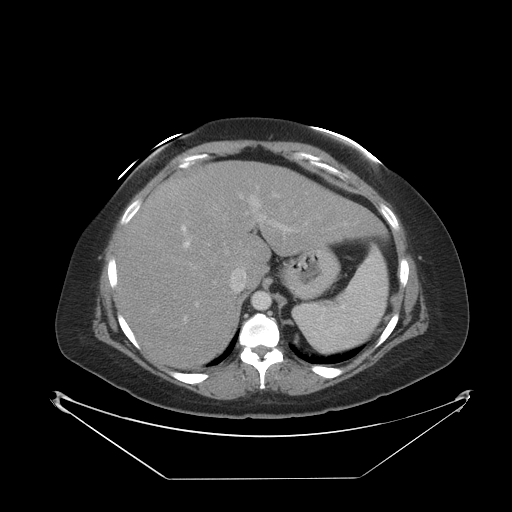
[im 84/101  lung]
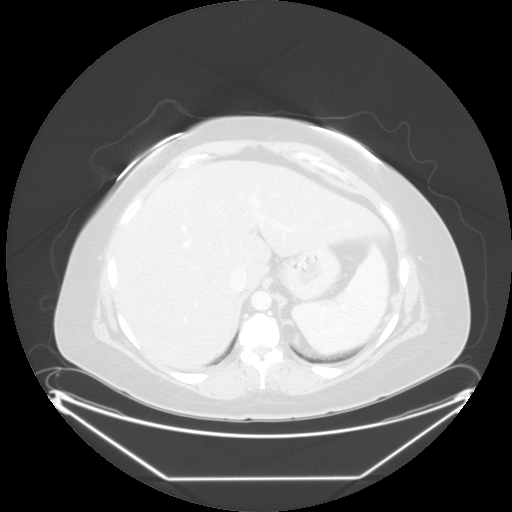

[Series 400: reformatted · sagittal · 0.97mm/px · 8 of 174 slices shown]
[im 18/174  soft-tissue]
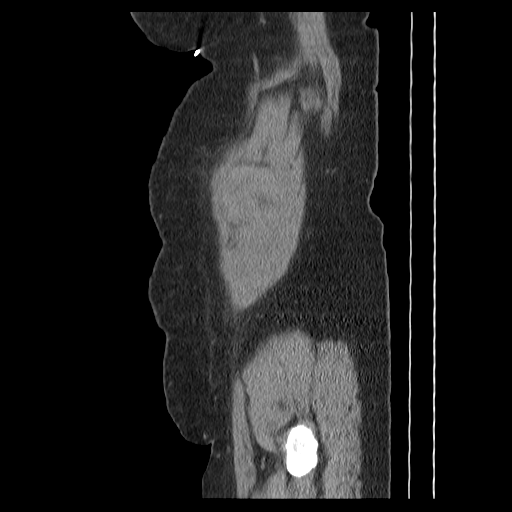
[im 35/174  soft-tissue]
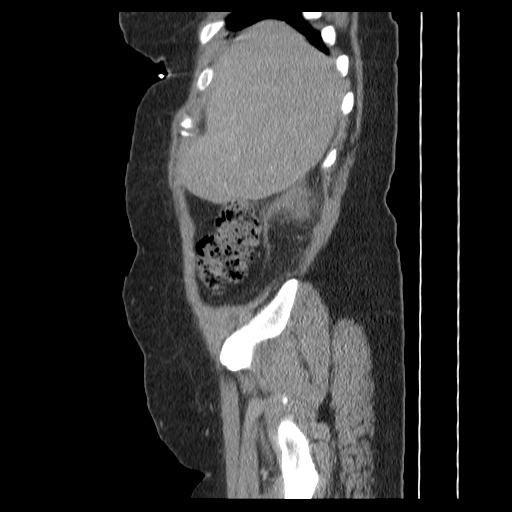
[im 52/174  soft-tissue]
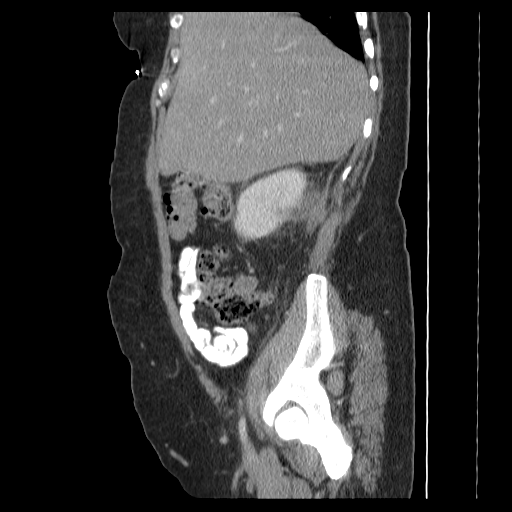
[im 70/174  soft-tissue]
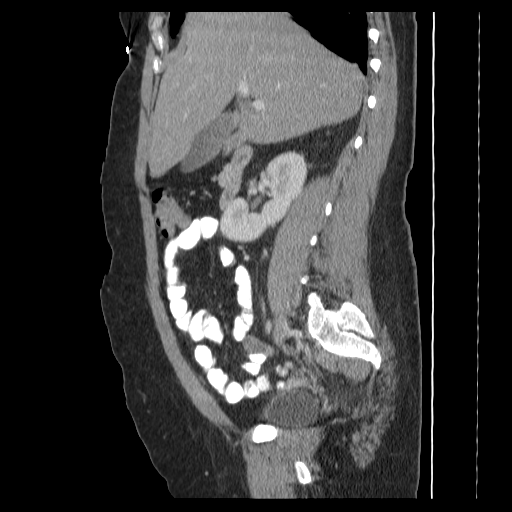
[im 104/174  soft-tissue]
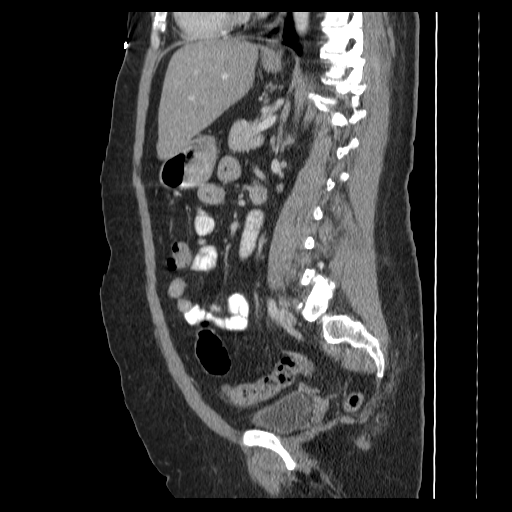
[im 122/174  soft-tissue]
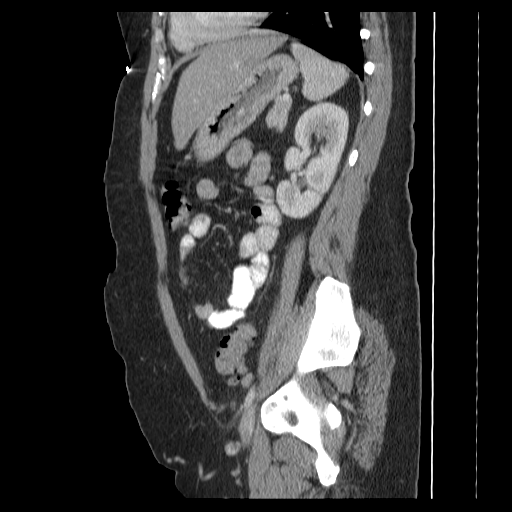
[im 139/174  soft-tissue]
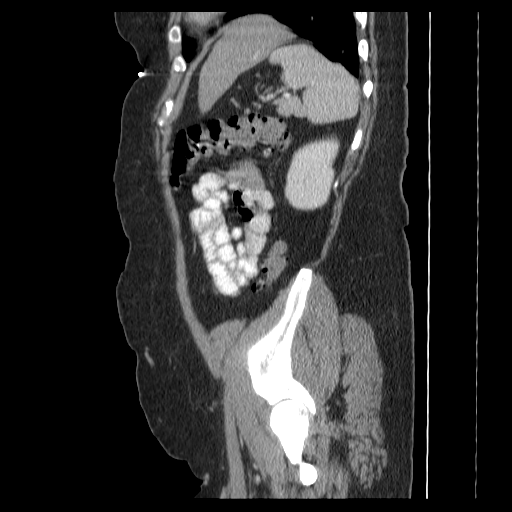
[im 156/174  soft-tissue]
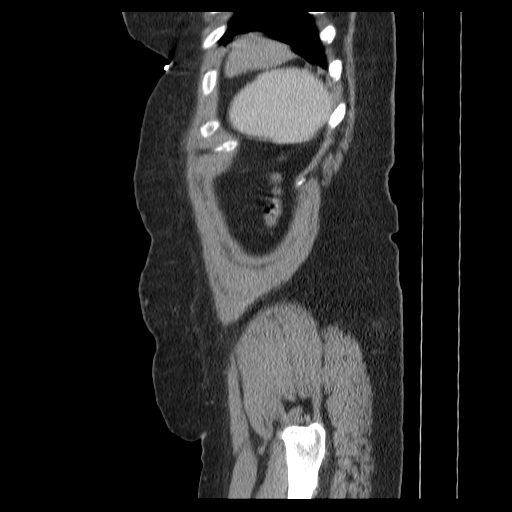

[13 of 32 positions shown; findings below may reference images not displayed]

FINDINGS: There is fatty infiltration of the liver.  No focal
abnormality.  Gallbladder, spleen, pancreas, adrenals, left kidney
unremarkable.

Within the right perinephric space, there is an ill-defined complex
soft tissue area measuring 6 x 4 cm.  Slight impression on the
underlying right kidney laterally. On the delayed images, there is
an area of decreased enhancement within the right mid to lower
pole, underlying this perinephric process, compatible with
pyelonephritis.  Therefore, this perinephric soft tissue likely
represents phlegmon/developing abscess.

The bowel grossly unremarkable.  Lung bases clear.
IMPRESSION: Focal right kidney pyelonephritis involving the mid to lower pole,
with overlying perinephric fluid or developing abscess.

CT PELVIS
FINDINGS: Uterus and adnexa unremarkable.  Pelvic large and small
bowel grossly unremarkable.  No free fluid, free air, or
adenopathy.

Bony structures unremarkable.
IMPRESSION: No acute findings in the pelvis.

These results were called to Dr. Horrel at the time of
interpretation.

## 2008-08-20 IMAGING — CR DG LUMBAR SPINE COMPLETE 4+V
5 series · 5 of 5 positions shown · non-contrast
Comparison: None.

CLINICAL DATA: Back pain with radiculopathy.

LUMBAR SPINE - COMPLETE 4+ VIEW

[view not recorded (1 of 5)]
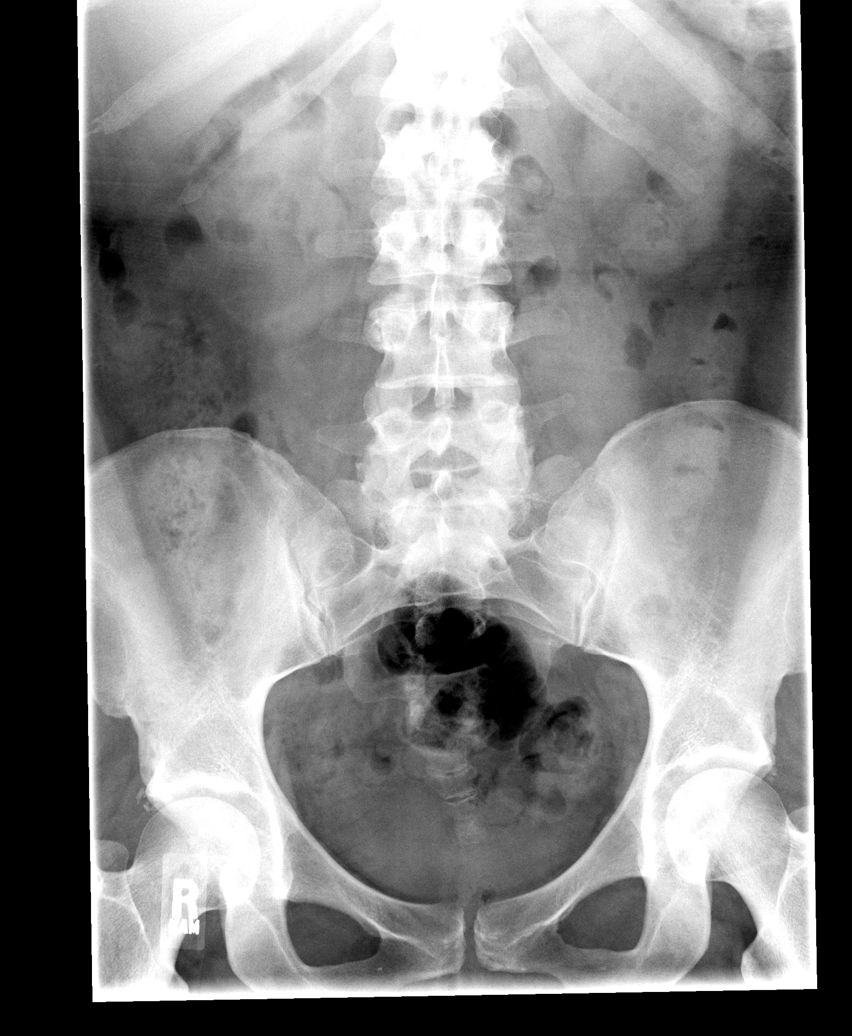

[view not recorded (2 of 5)]
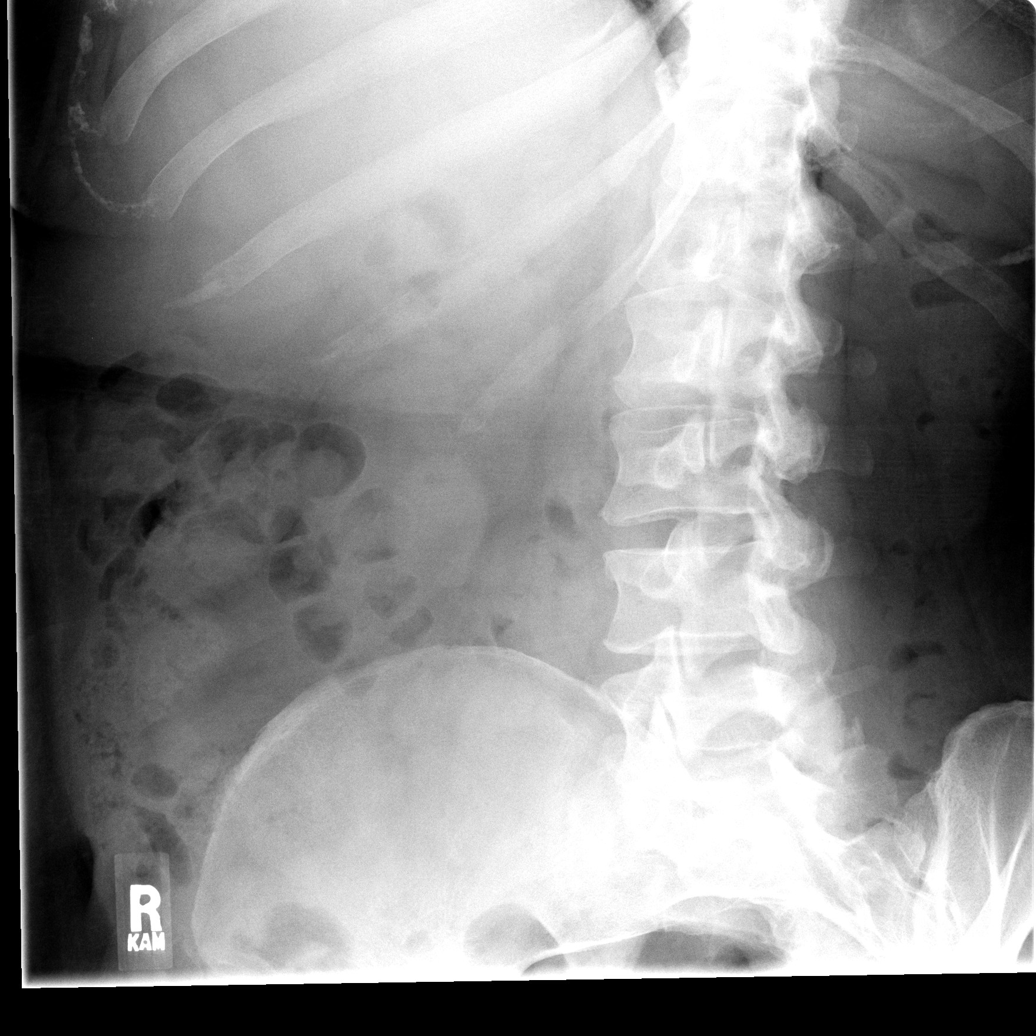

[view not recorded (3 of 5)]
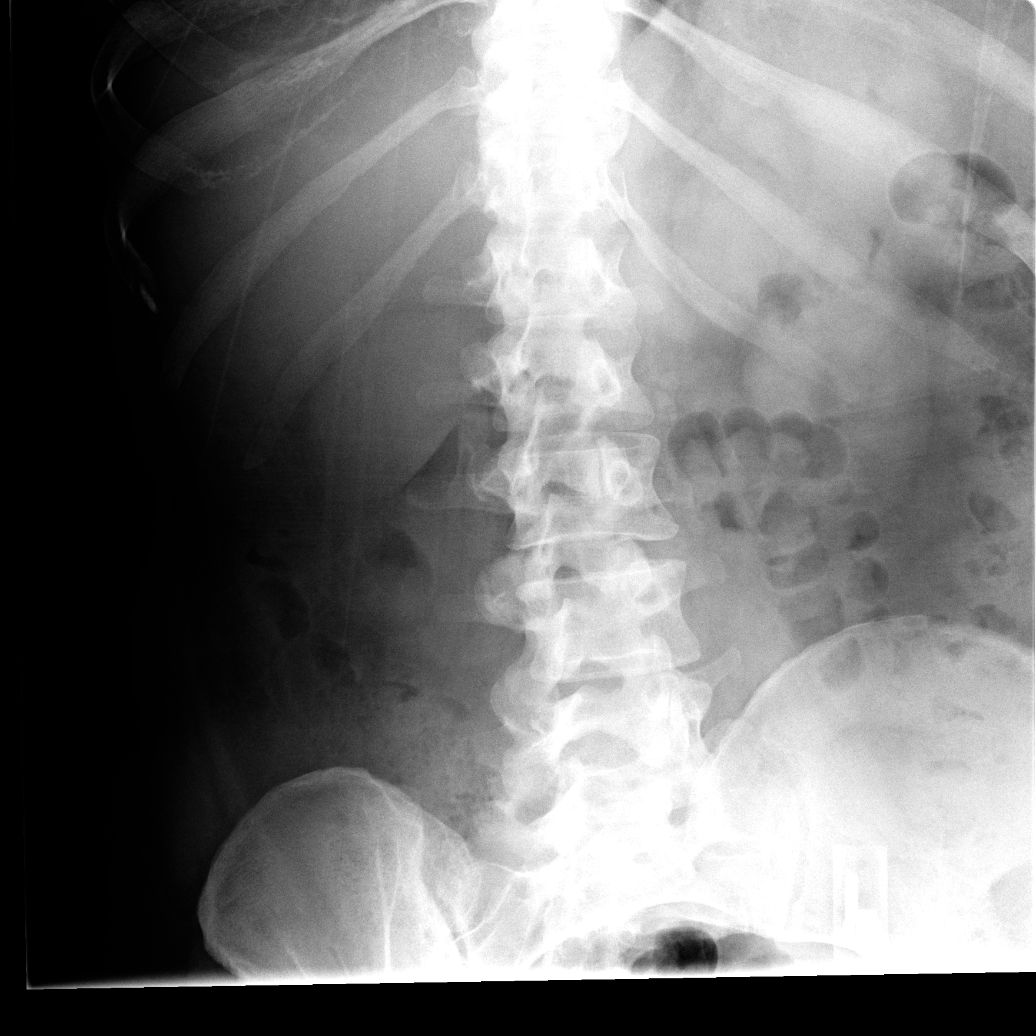

[view not recorded (4 of 5)]
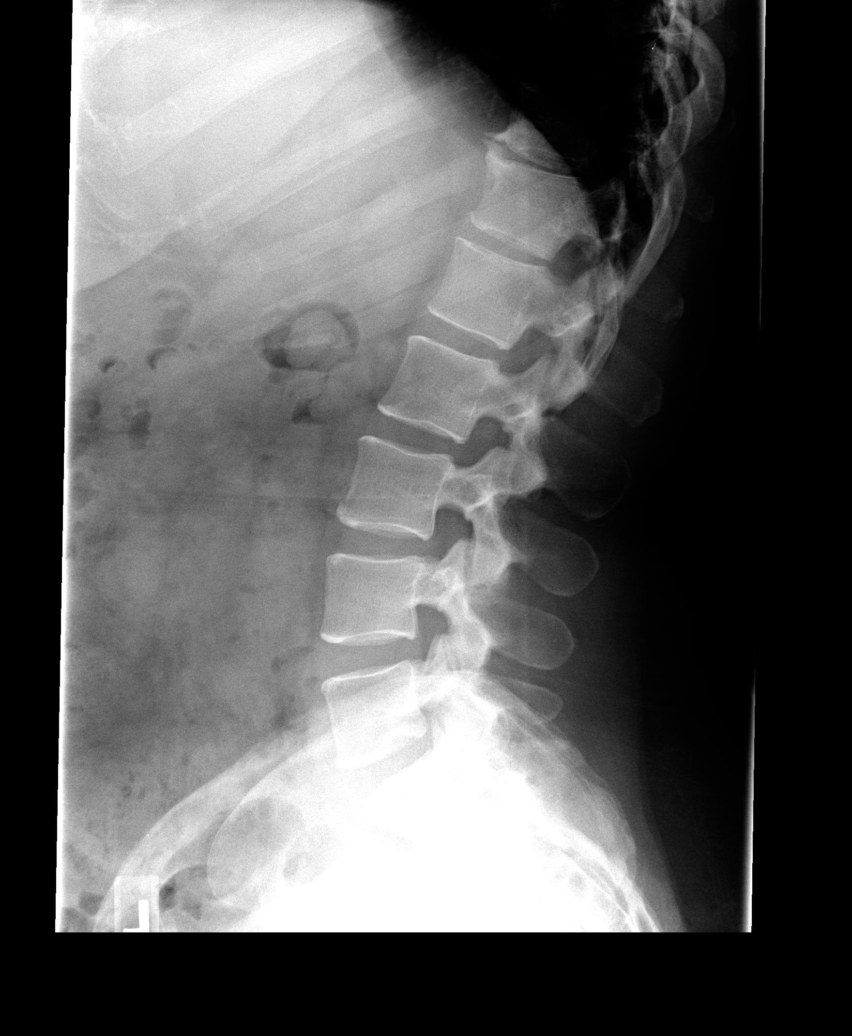

[view not recorded (5 of 5)]
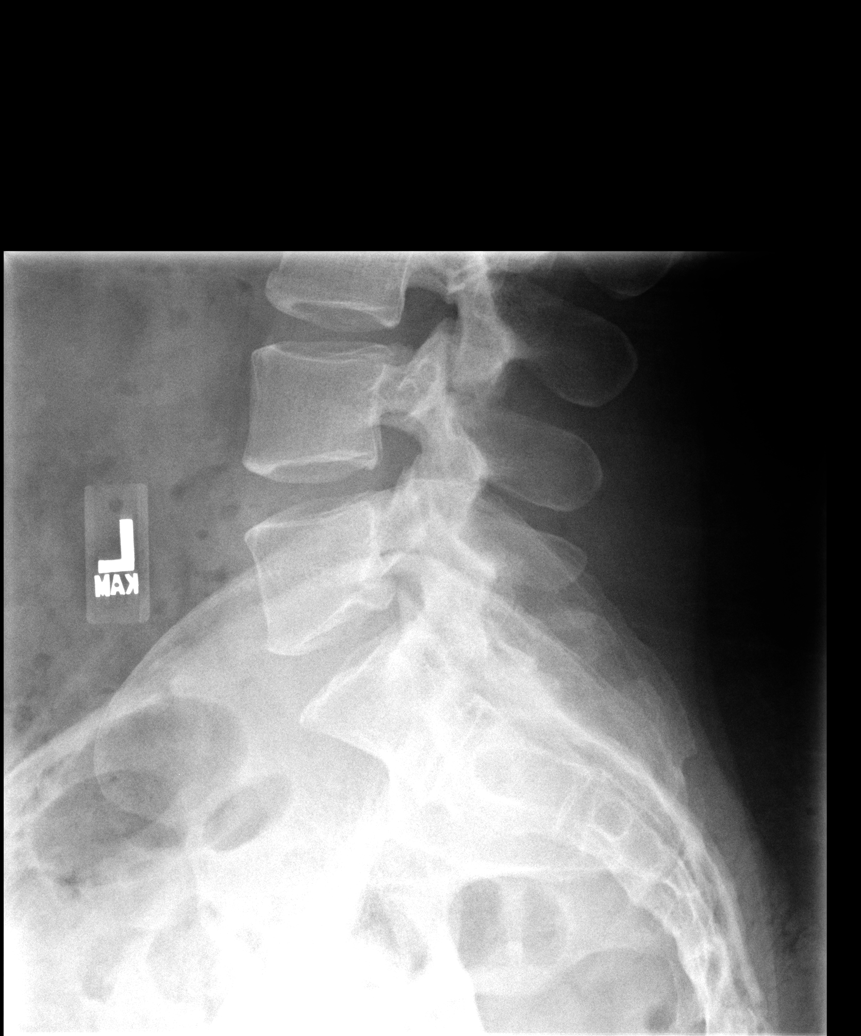

[5 of 5 positions shown; findings below may reference images not displayed]

FINDINGS: No evidence for fracture or subluxation.  Alignment is
anatomic.  The intervertebral disc spaces are preserved.  The
patient does have loss of disc space with endplate spurring at T11-
T12.  Facets are well-aligned bilaterally.  SI joints and sacrum
are normal.
IMPRESSION: No acute bony findings in the lumbar spine.

## 2008-08-21 IMAGING — CR DG CHEST 2V
2 series · 2 of 2 positions shown · non-contrast
Comparison: 09/01/2007

CLINICAL DATA: Wheezing

CHEST - 2 VIEW

[w chest pa]
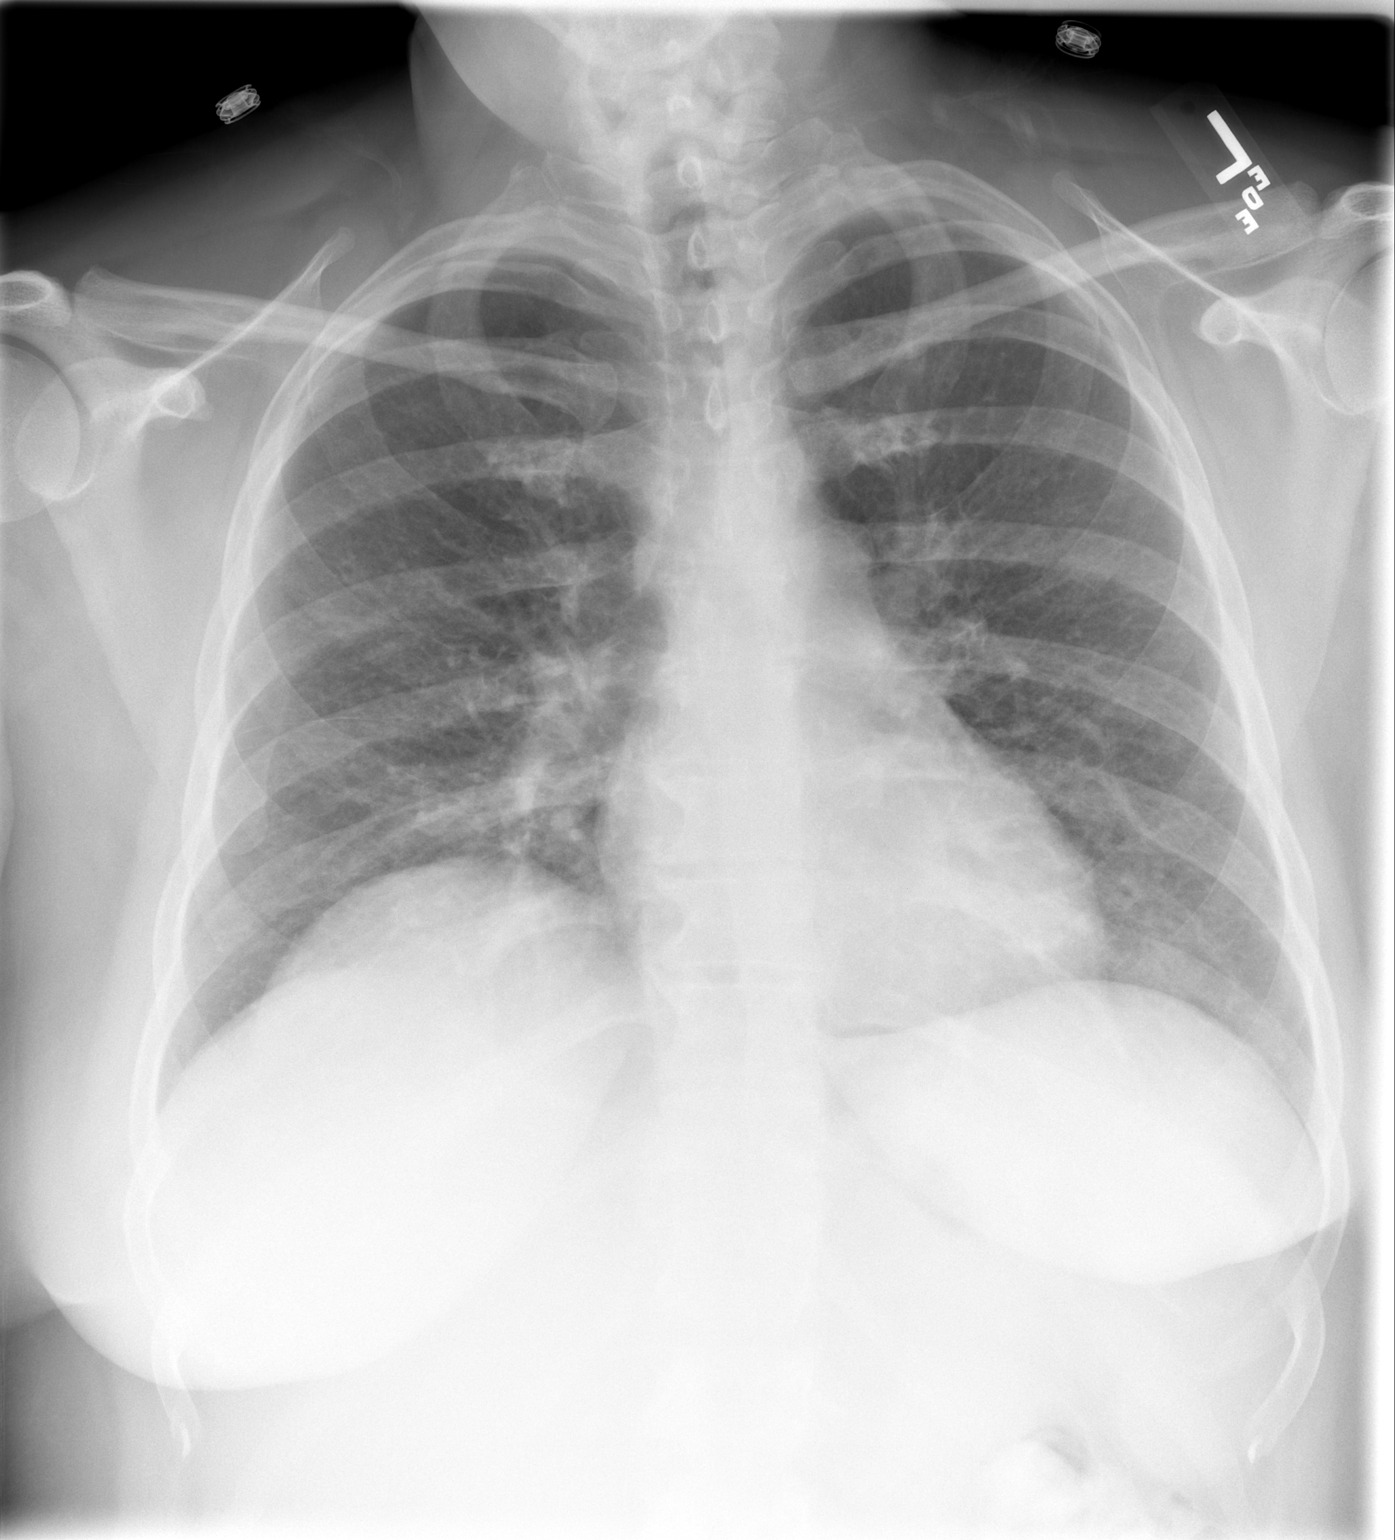

[w chest lat]
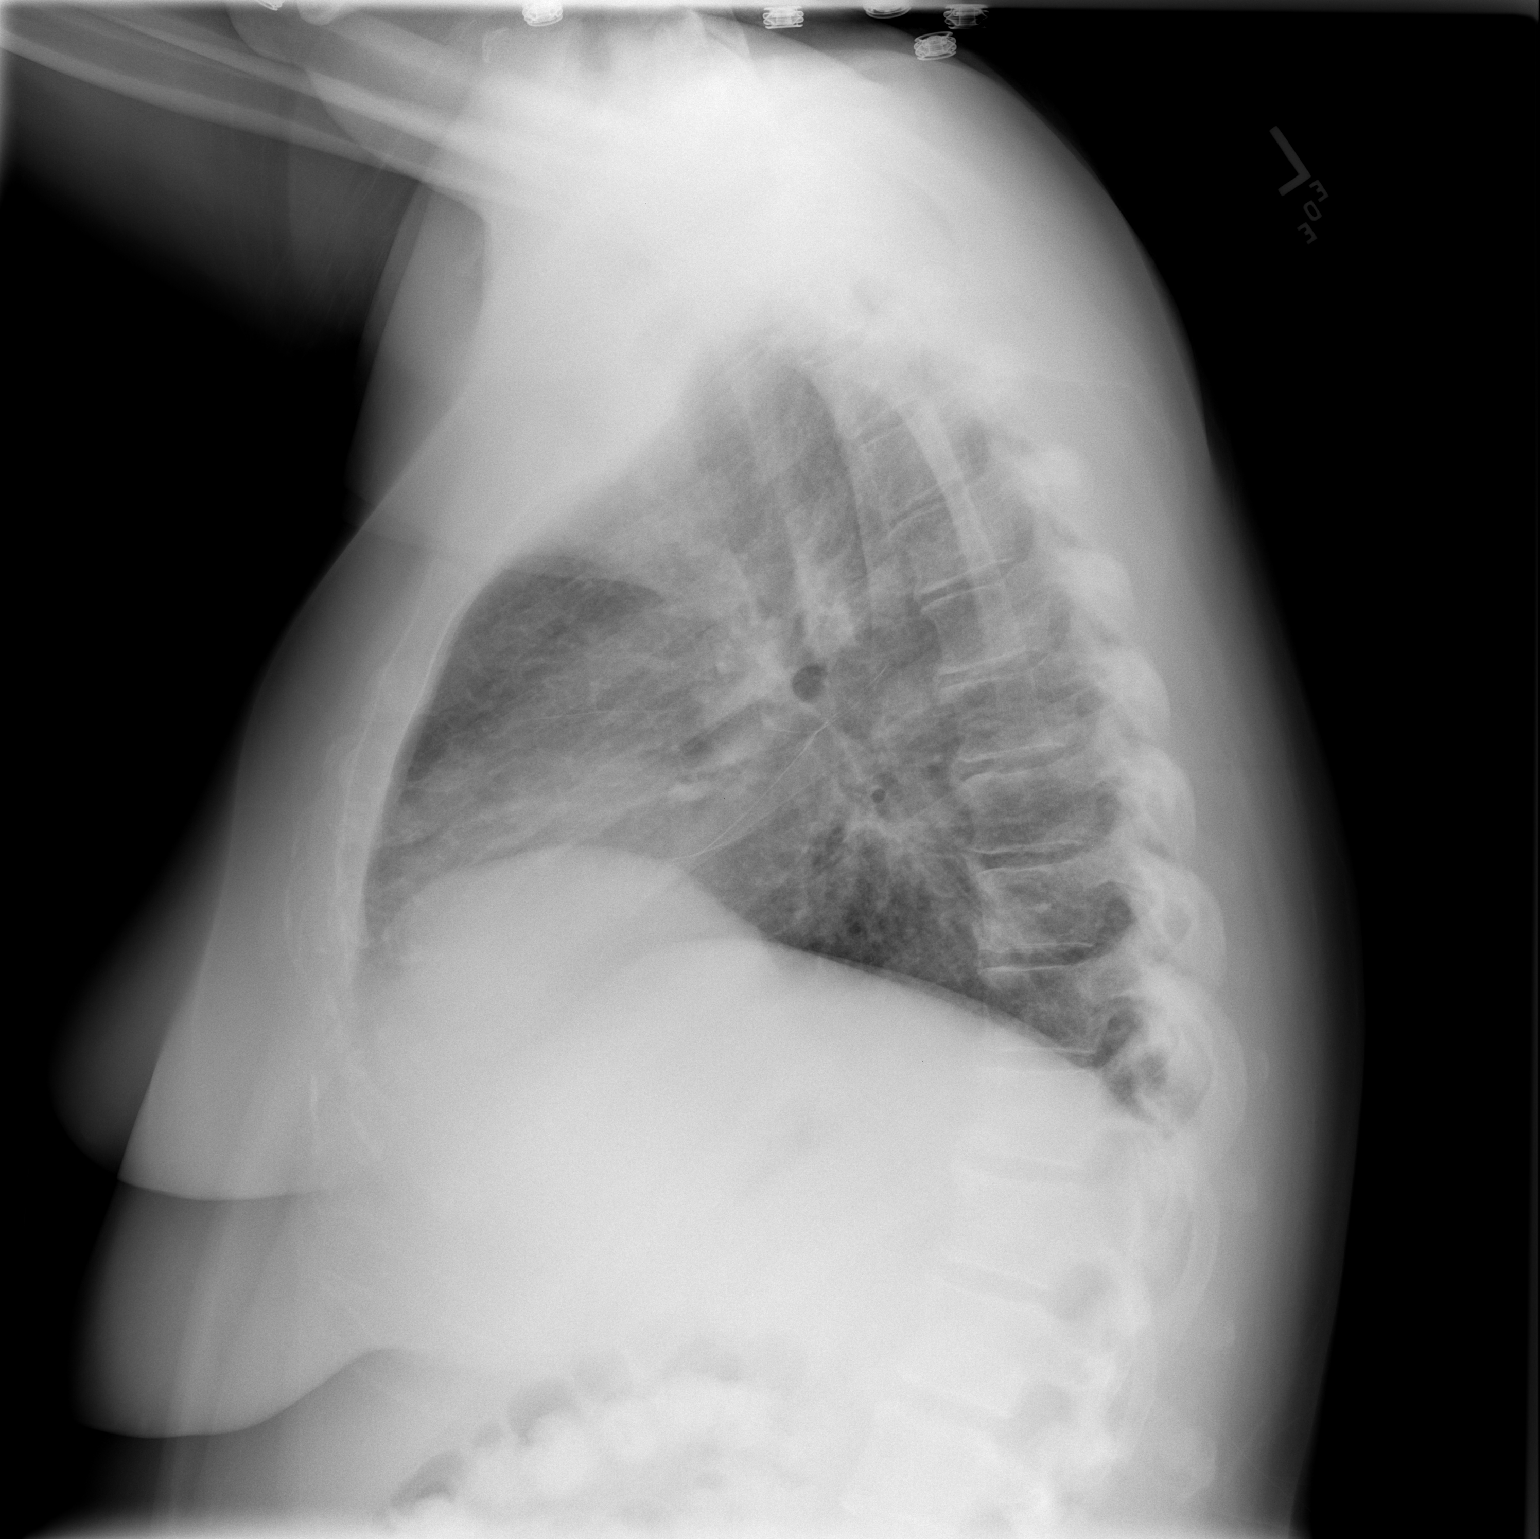

[2 of 2 positions shown; findings below may reference images not displayed]

FINDINGS: Heart and mediastinal contours normal.  There is
peribronchial thickening as before.  In addition, there is now
increased density in the right lower lobe, seen in both views.  The
findings are consistent with subsegmental atelectasis or
atelectatic pneumonia.

No pleural fluid.  Osseous structures intact.
IMPRESSION: 1.  Bronchitic changes as before.
2.  Patchy right lower lobe airspace density consistent with
subsegmental atelectasis or atelectatic pneumonia - new finding.

## 2008-08-22 ENCOUNTER — Telehealth: Payer: Self-pay | Admitting: *Deleted

## 2008-08-22 IMAGING — CT CT ABCESS DRAINAGE
1 series · 16 of 32 positions shown, 20 images · non-contrast
Comparison: none

Clinical Data/Indication: PERINEPHRIC ABSCESS DRAINAGE. .

CT GUIDED ABCESS DRAINAGE WITH CATHETER
Procedure: The procedure, risks, benefits, and alternatives were
explained to the patient. Questions regarding the procedure were
encouraged and answered. The patient understands and consents to
the procedure.
The right posterior back was prepped withbetadine in a sterile
fasion, and a sterile drape was applied covering the operative
field. A sterile gown and sterile gloves were used for the
procedure.
Under CT guidance, a 19 gauge needle was inserted into the heart
height perinephric abscess and removed over an Amplatz.  12-French
drain was advanced over the wire and coiled in the abscess.  It was
looped and string fixed and sewn to the skin.  50 ml pus was
aspirated.

[Series 2: asp · axial · 0.98mm/px · z∈[-278,-123]mm · 16 of 52 slices shown, 20 images]
[im 4/52  soft-tissue]
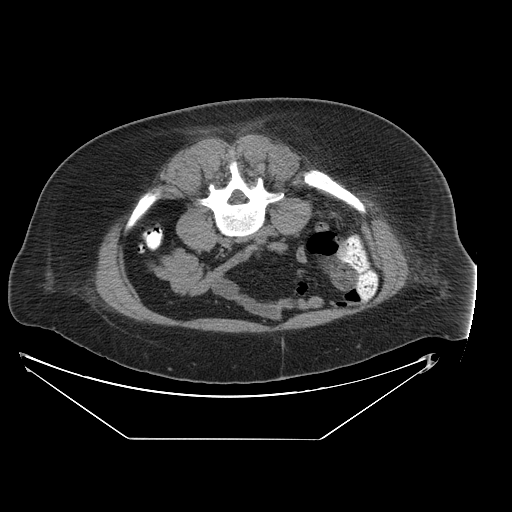
[im 4/52  bone]
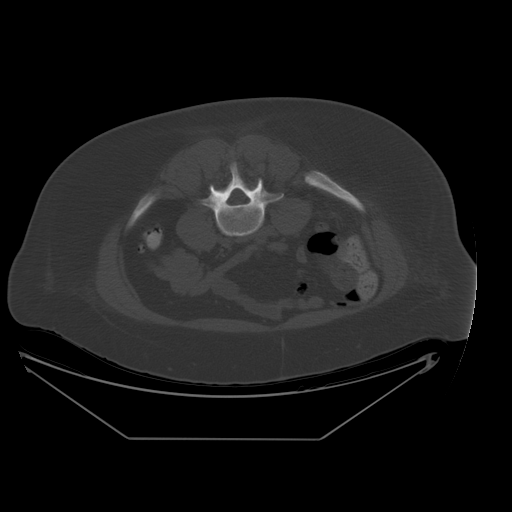
[im 7/52  soft-tissue]
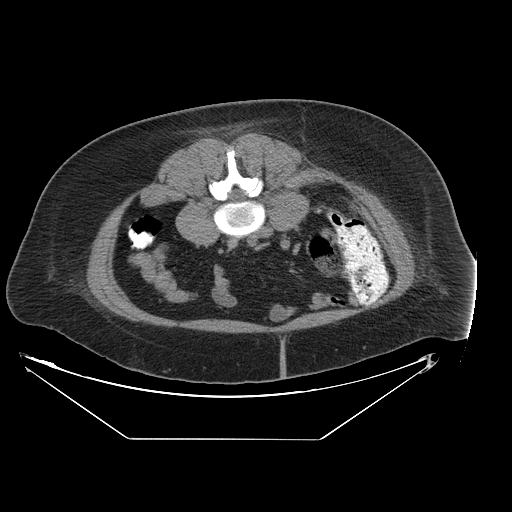
[im 10/52  soft-tissue]
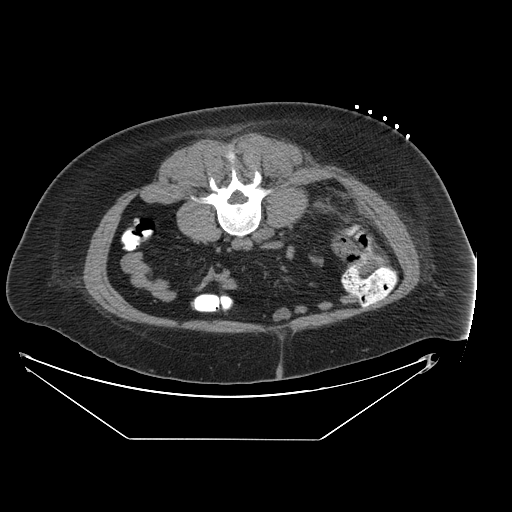
[im 14/52  soft-tissue]
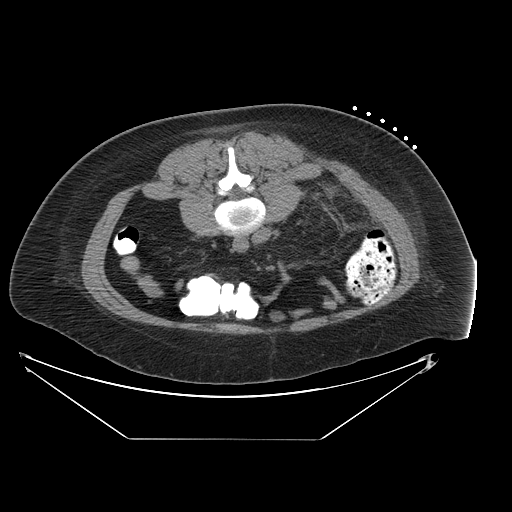
[im 17/52  soft-tissue]
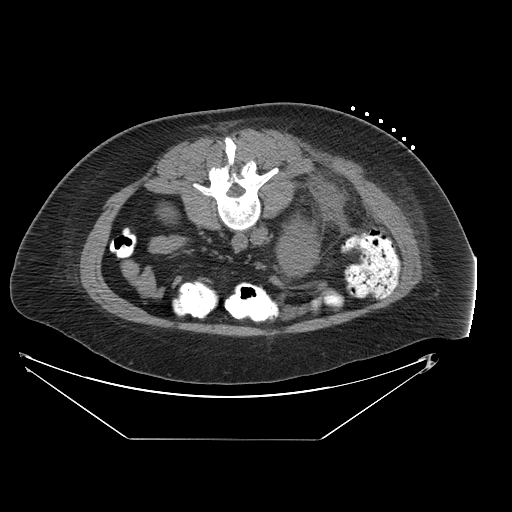
[im 20/52  soft-tissue]
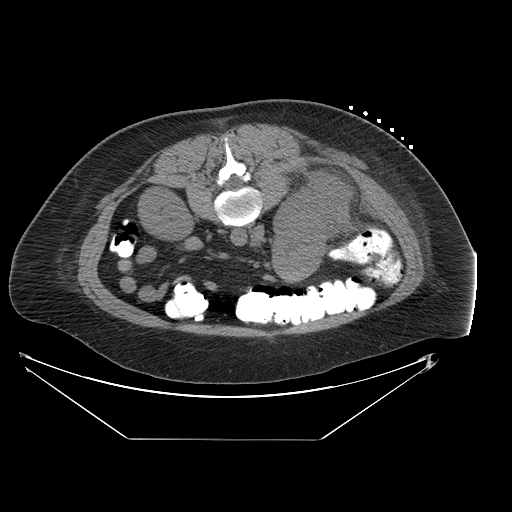
[im 24/52  soft-tissue]
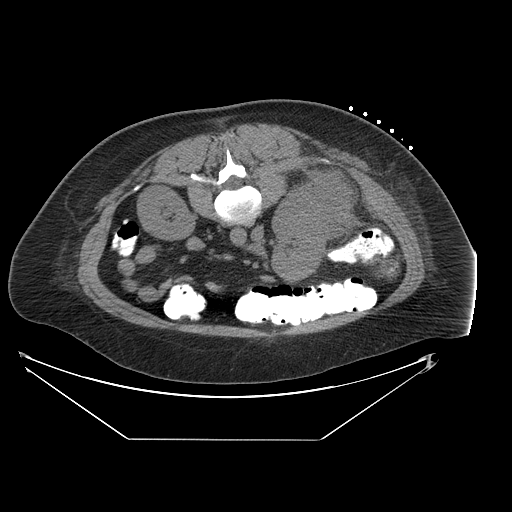
[im 28/52  soft-tissue]
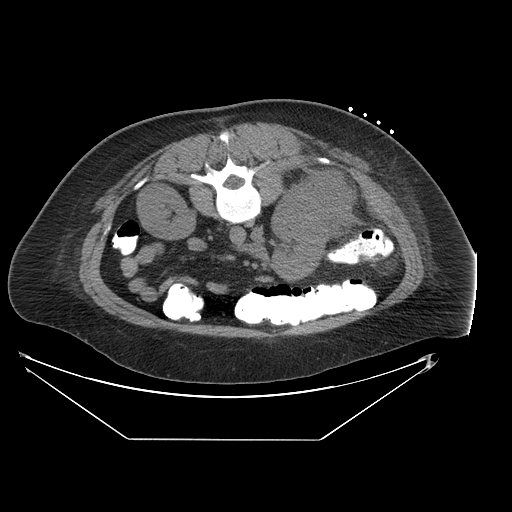
[im 32/52  soft-tissue]
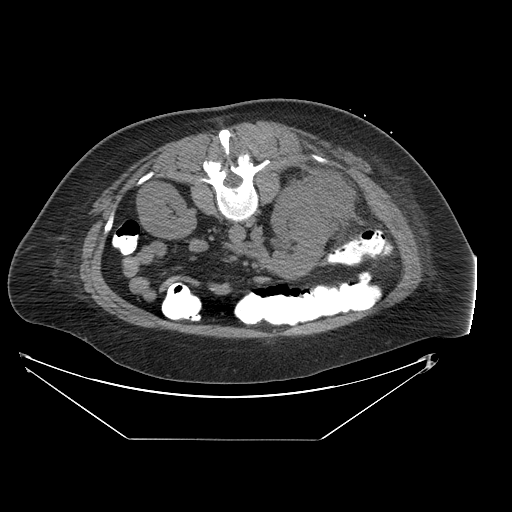
[im 32/52  bone]
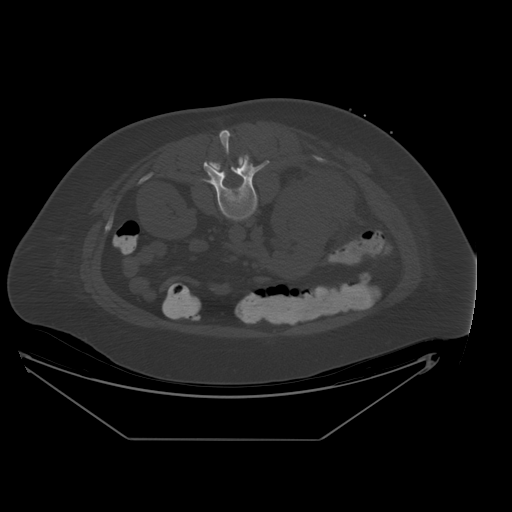
[im 35/52  soft-tissue]
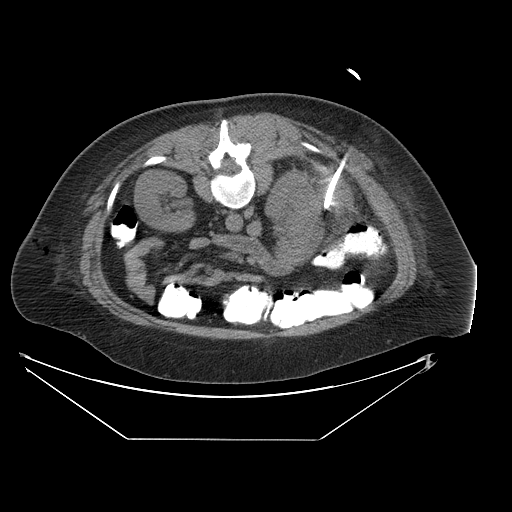
[im 38/52  soft-tissue]
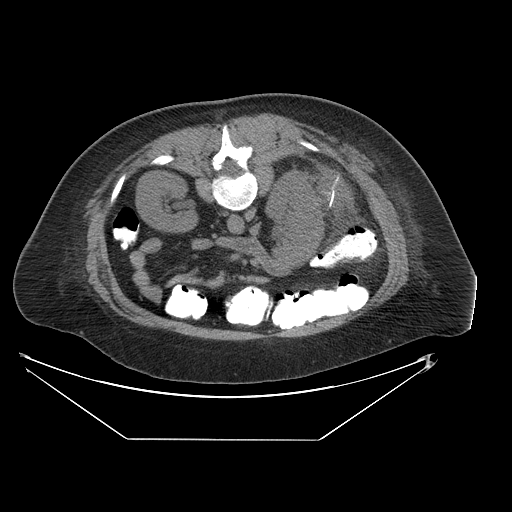
[im 42/52  soft-tissue]
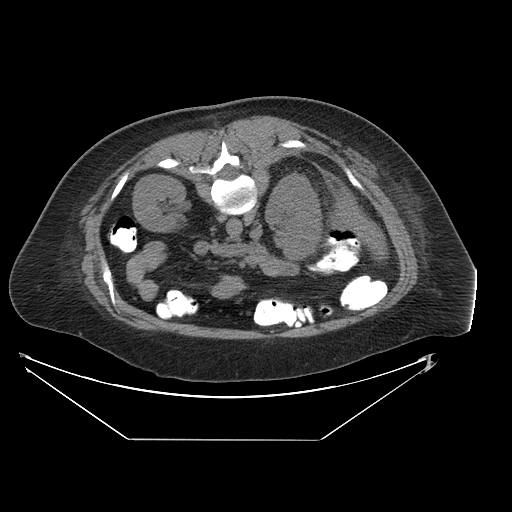
[im 45/52  soft-tissue]
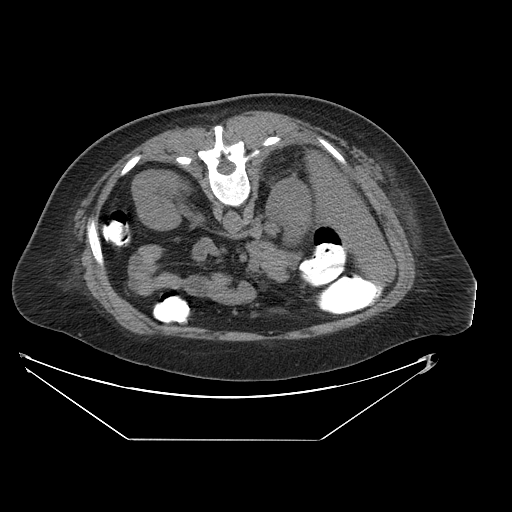
[im 45/52  lung]
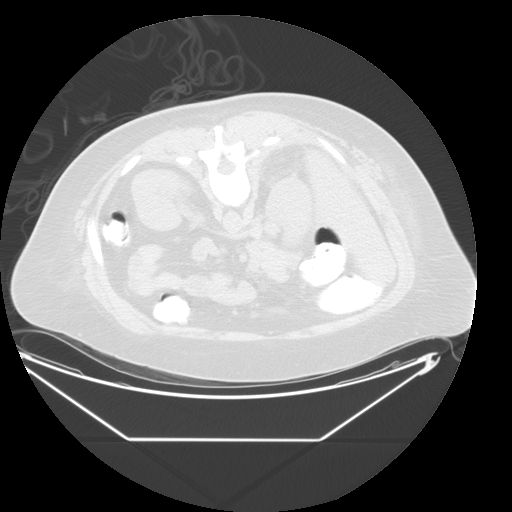
[im 47/52  lung]
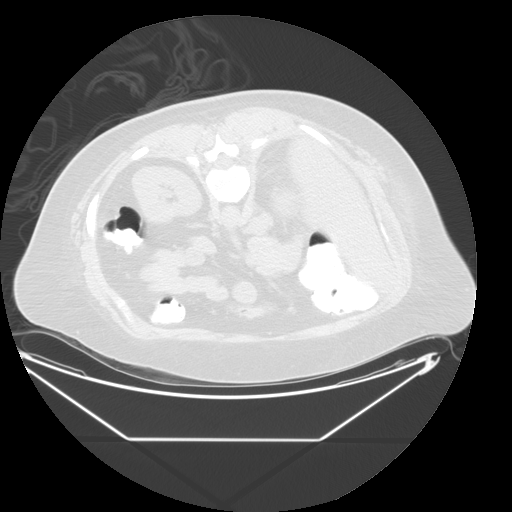
[im 48/52  soft-tissue]
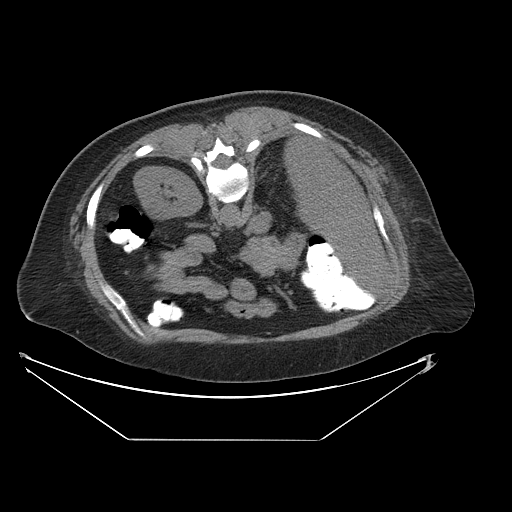
[im 48/52  lung]
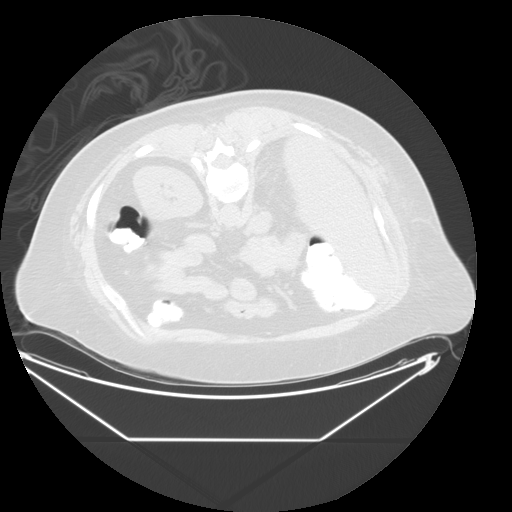
[im 50/52  lung]
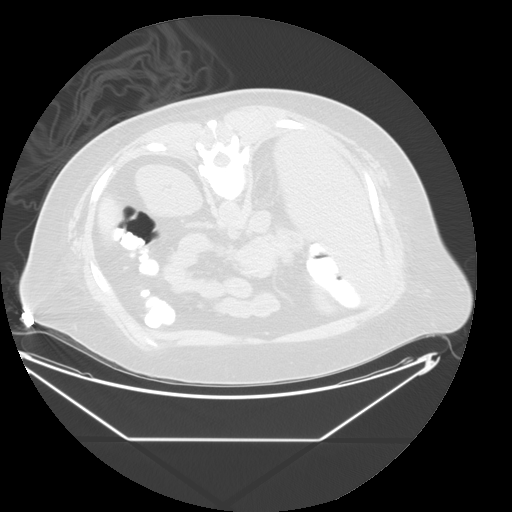

[16 of 32 positions shown; findings below may reference images not displayed]

FINDINGS: Images demonstrate placement of a 12-French pigtail
abscess drained into the right perinephric abscess

Complications: None
IMPRESSION: Successful right 12 French perinephric abscess drain.

## 2008-08-26 IMAGING — CT CT ABDOMEN W/ CM
2 of 5 series · 17 of 46 positions shown, 19 images · IV contrast (APPLIED)
Comparison: 11/21/2007

CLINICAL DATA: Follow up right peri nephric abscess status post
percutaneous drainage.

CT ABDOMEN WITH CONTRAST
TECHNIQUE: Multidetector CT imaging of the abdomen was performed
following the standard protocol during bolus administration of
intravenous contrast.
Contrast: 100 ml Dmnipaque-GRR

[Series 2: abd/ with 5.0 b31f st · axial · 0.85mm/px · z∈[-278,-18]mm · 14 of 60 slices shown, 16 images]
[im 4/60  soft-tissue]
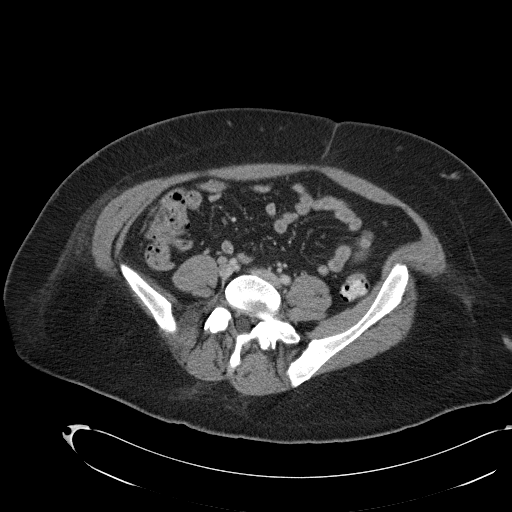
[im 4/60  bone]
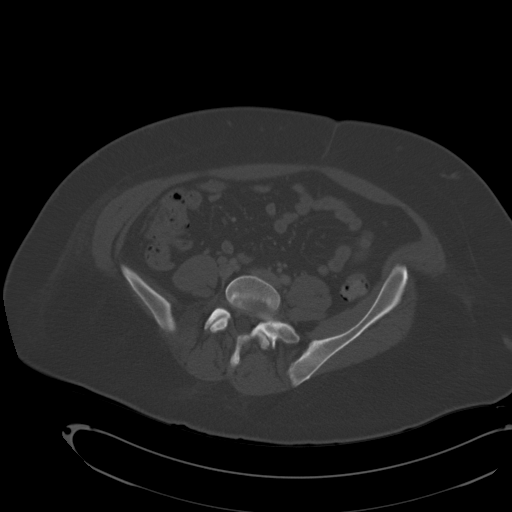
[im 7/60  soft-tissue]
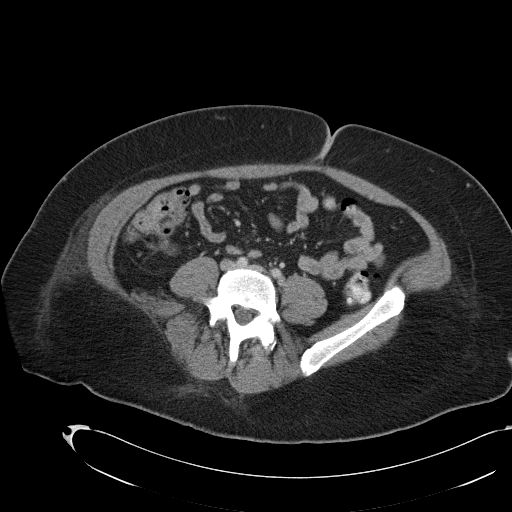
[im 11/60  soft-tissue]
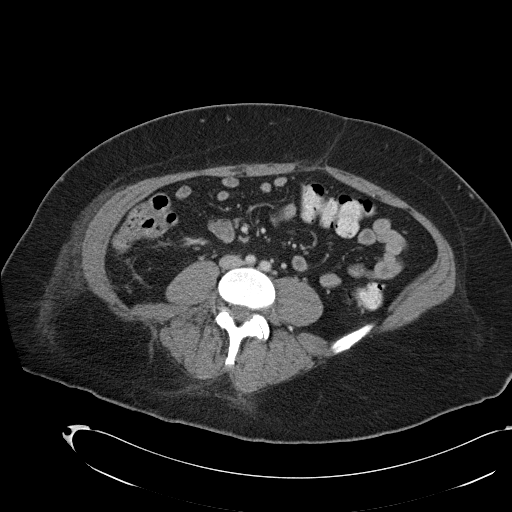
[im 18/60  soft-tissue]
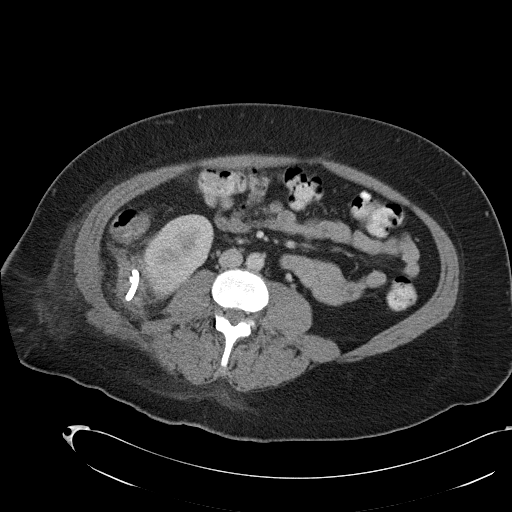
[im 21/60  soft-tissue]
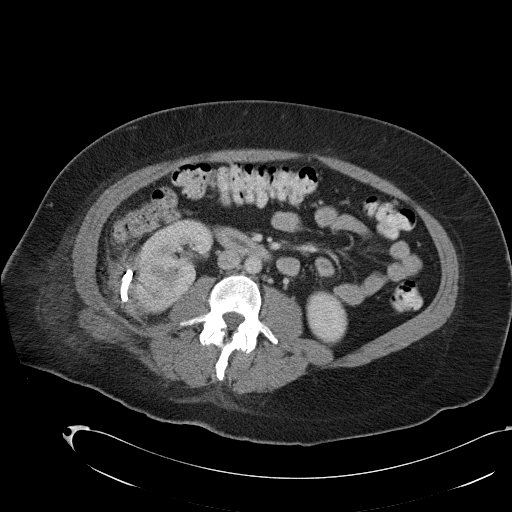
[im 25/60  soft-tissue]
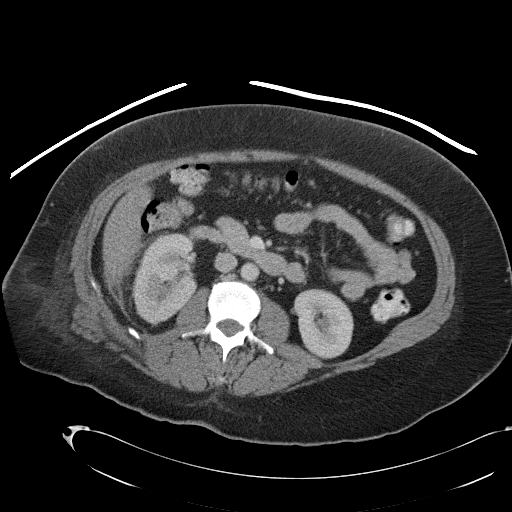
[im 28/60  soft-tissue]
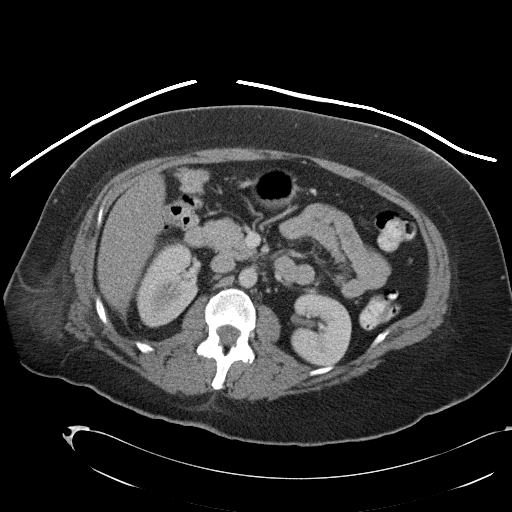
[im 32/60  soft-tissue]
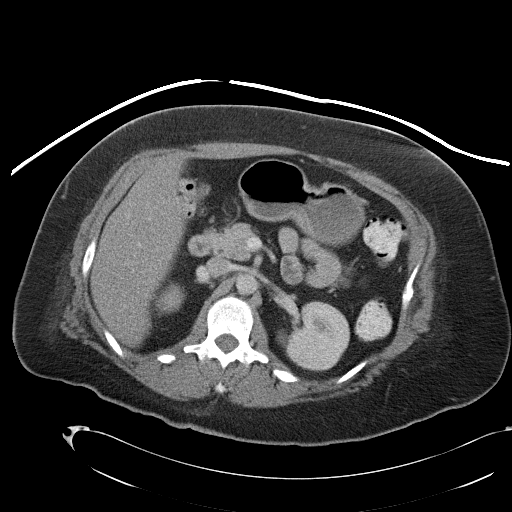
[im 35/60  soft-tissue]
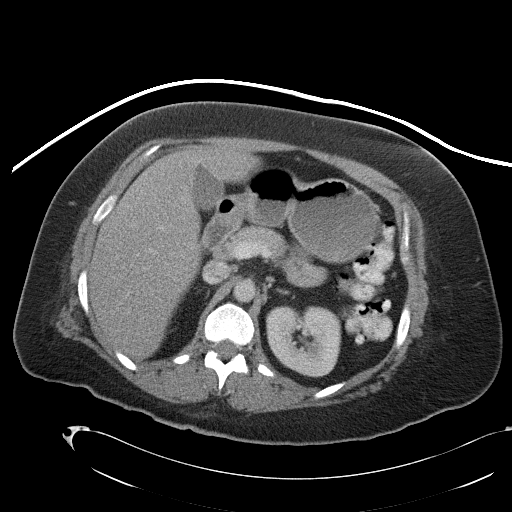
[im 35/60  bone]
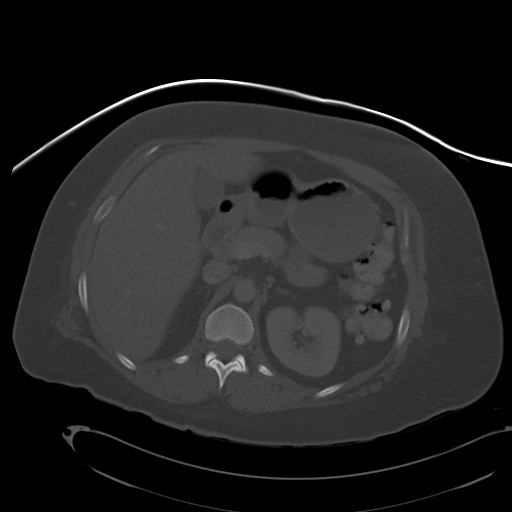
[im 39/60  soft-tissue]
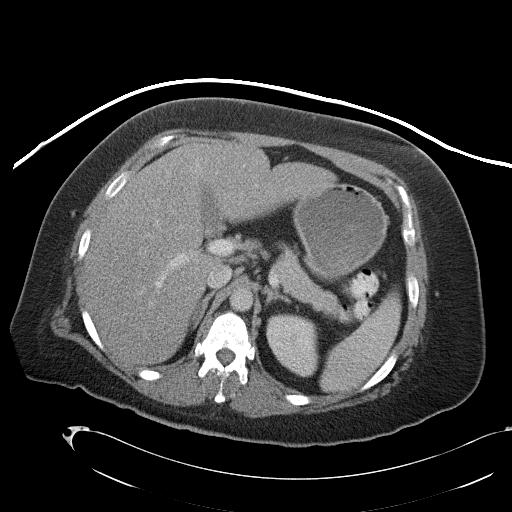
[im 46/60  soft-tissue]
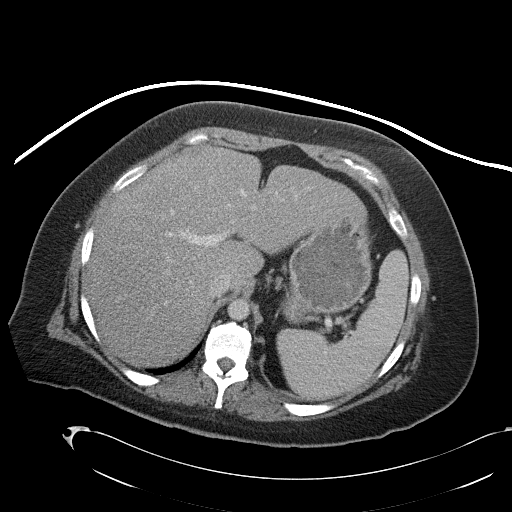
[im 49/60  soft-tissue]
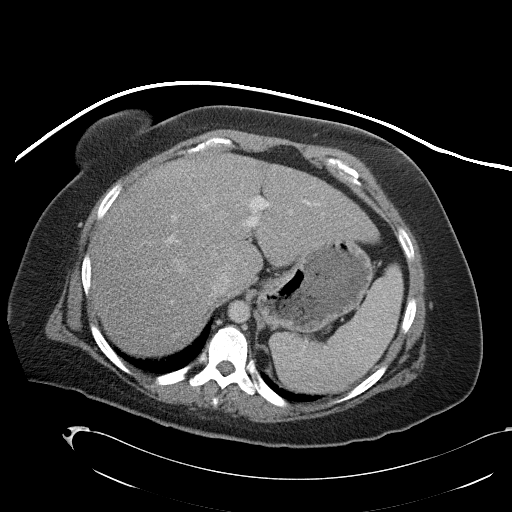
[im 53/60  soft-tissue]
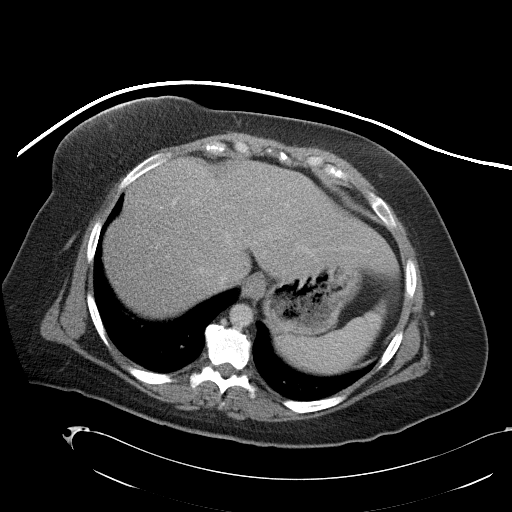
[im 56/60  soft-tissue]
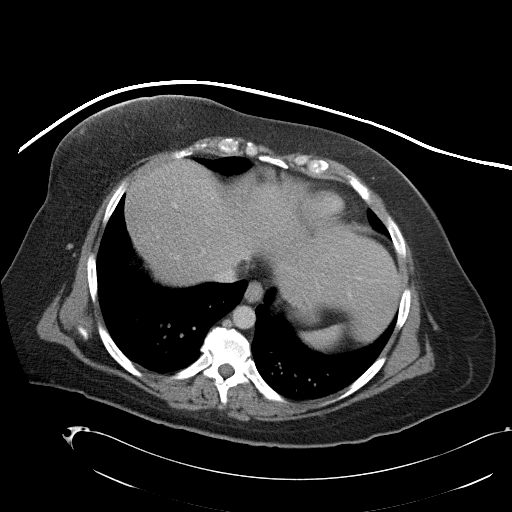

[Series 602: cor abd · coronal · 0.85mm/px · 3 of 72 slices shown]
[im 24/72  soft-tissue]
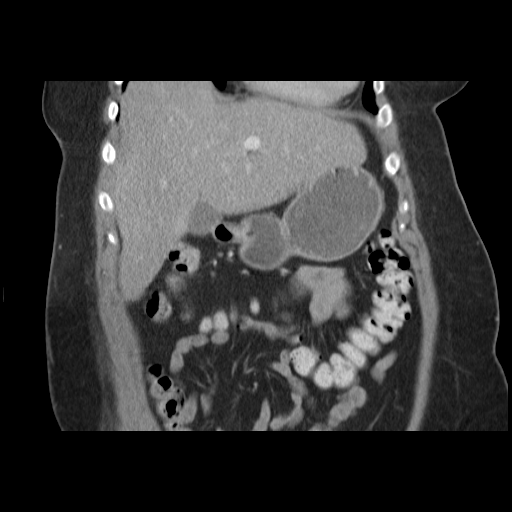
[im 32/72  soft-tissue]
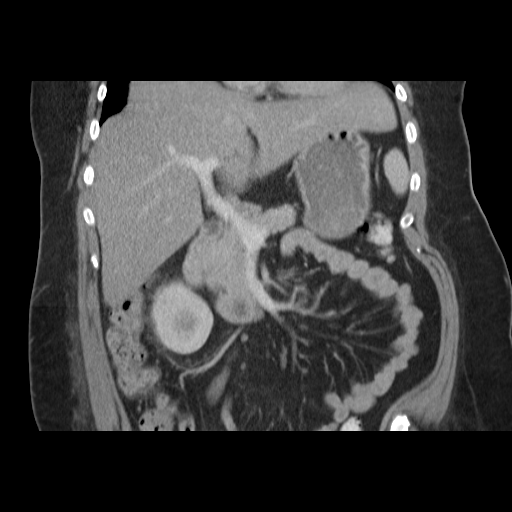
[im 40/72  soft-tissue]
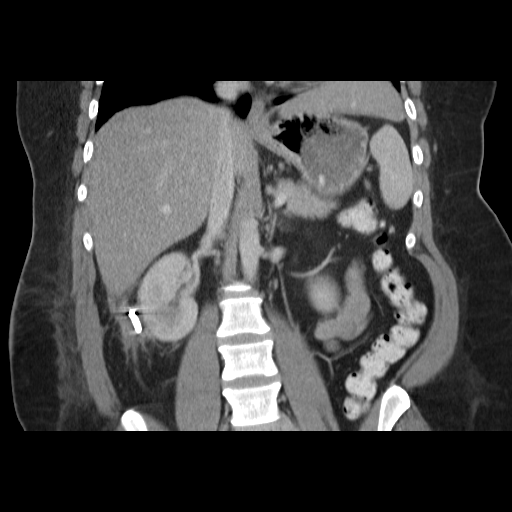

[17 of 46 positions shown; findings below may reference images not displayed]

FINDINGS: There has been interval placement of a right-sided
percutaneous drainage catheter, which is appropriately positioned
in the right perinephric fluid collection seen on prior CT.  There
has been near complete resolution of this collection.  Residual
adjacent inflammatory changes persist.  No other fluid collections
are identified in the abdomen.

There is no evidence of hydronephrosis or lymphadenopathy.  No
other inflammatory process identified.  The liver, gallbladder,
pancreas, spleen, and adrenal glands are normal appearance.  Left
kidney is also normal in appearance.
IMPRESSION: Near complete resolution of right perinephric fluid collection
status post placement percutaneous drainage catheter.  No new
findings.

## 2008-09-03 ENCOUNTER — Ambulatory Visit: Payer: Self-pay | Admitting: Family Medicine

## 2008-09-11 IMAGING — CR DG CHEST 2V
2 series · 2 of 2 positions shown · non-contrast
Comparison: 11/22/2007 study

CLINICAL DATA: Dyspnea.  Pain on right side.  History of asthma,
tobacco smoking, and chronic obstructive pulmonary disease.

CHEST - 2 VIEW

[w chest pa]
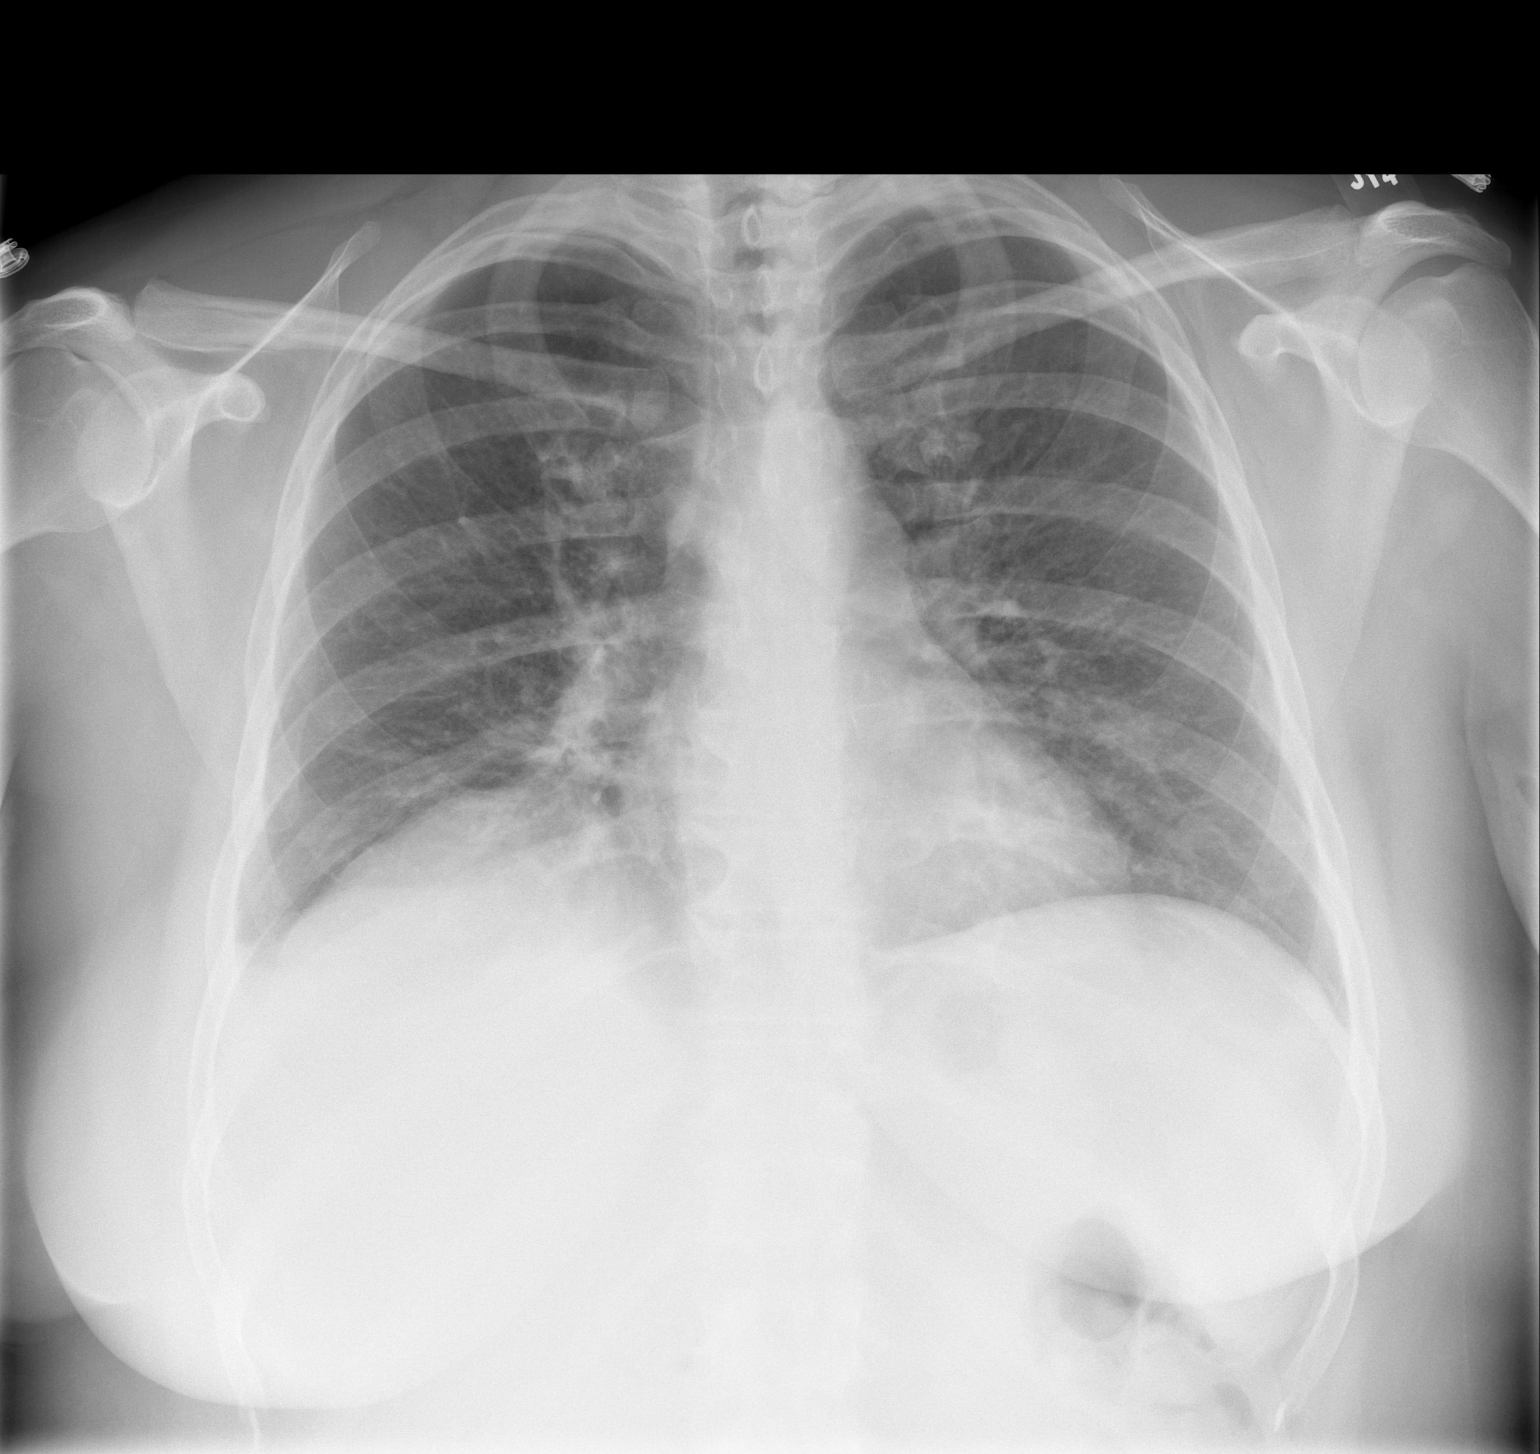

[w chest lat]
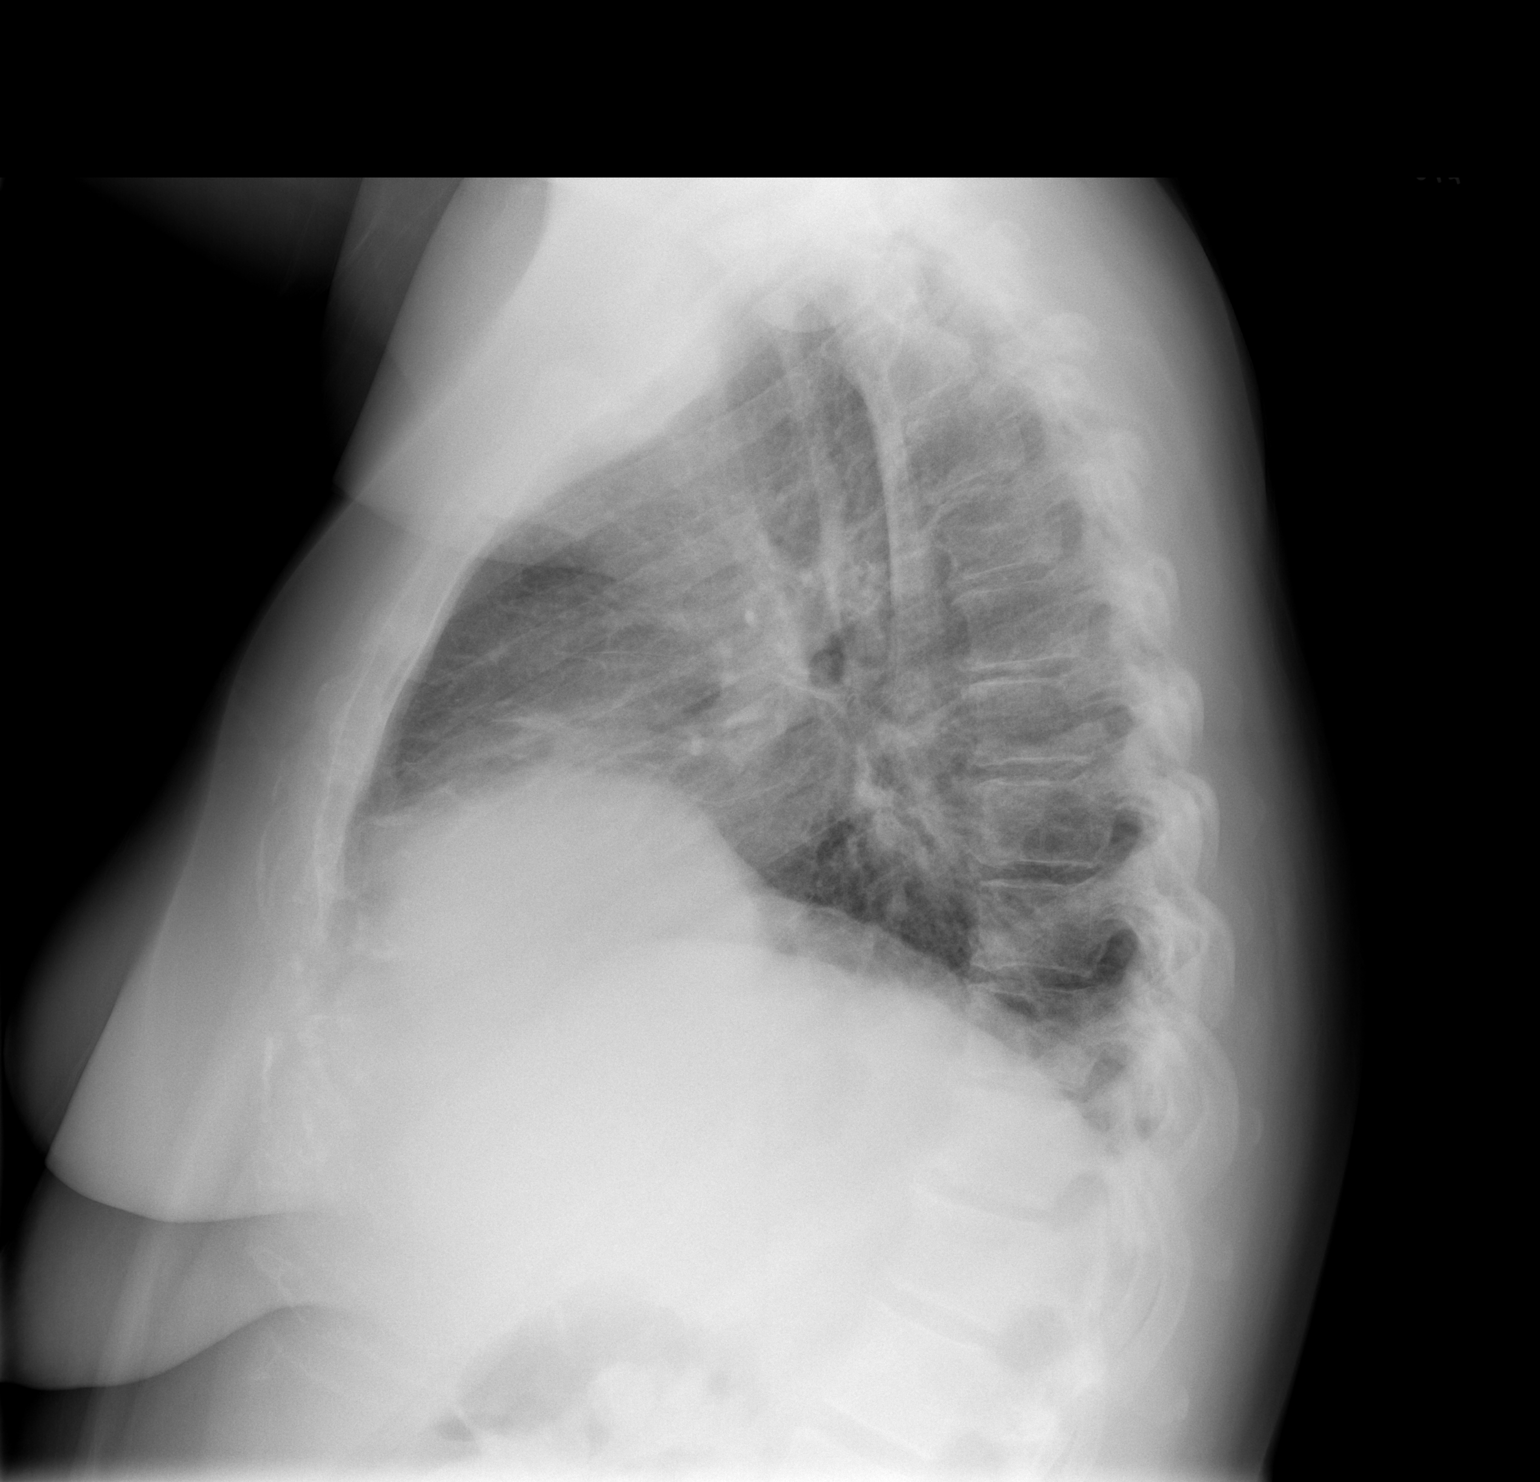

[2 of 2 positions shown; findings below may reference images not displayed]

FINDINGS: The cardiac silhouette is normal in size and shape.
There is an eventration of the anterior aspect of the right
hemidiaphragm unchanged.  There is minimal associated atelectasis
in the right middle lobe.  No consolidation is seen.  There is
minimal peribronchial thickening unchanged.  Osteophytes are
present in the spine.
IMPRESSION: Minimal right basilar atelectasis and infiltrate without evidence
of consolidation.  Minimal peribronchial thickening.

## 2008-09-12 IMAGING — CT CT ABCESS DRAINAGE
1 series · 15 of 32 positions shown, 19 images · non-contrast
Comparison: none

CLINICAL HISTORY: Recurrent right perinephric abscess.  Right
pyelonephritis.

[Series 2: abd pelvis · axial · 0.96mm/px · z∈[-269,-114]mm · 15 of 120 slices shown, 19 images]
[im 8/120  soft-tissue]
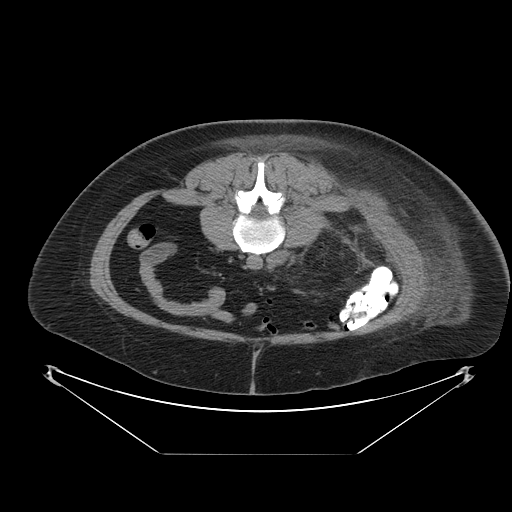
[im 8/120  bone]
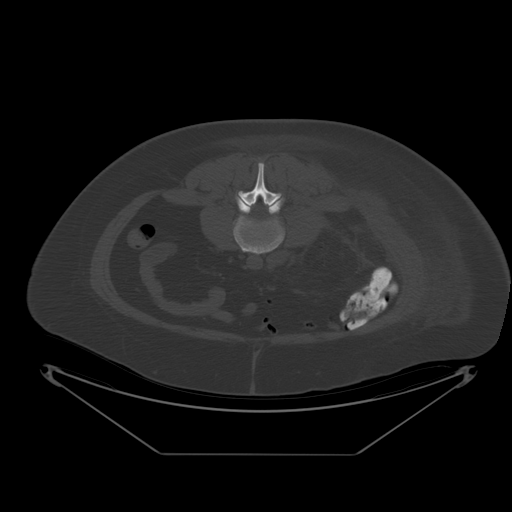
[im 16/120  soft-tissue]
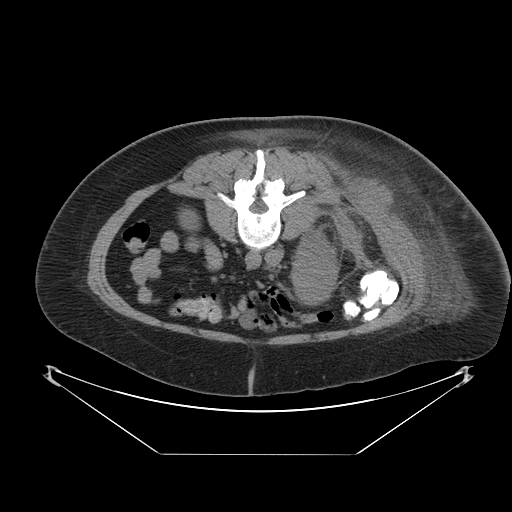
[im 24/120  soft-tissue]
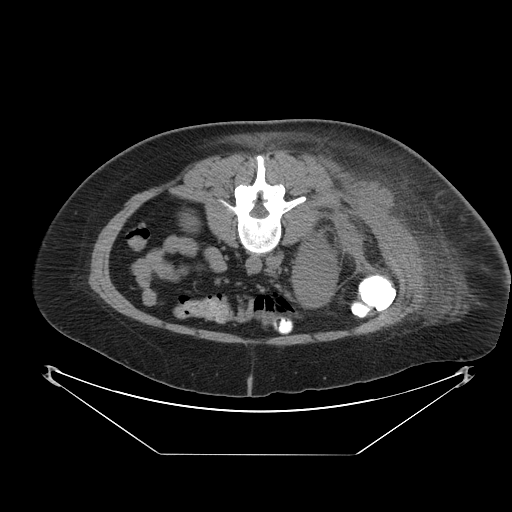
[im 35/120  soft-tissue]
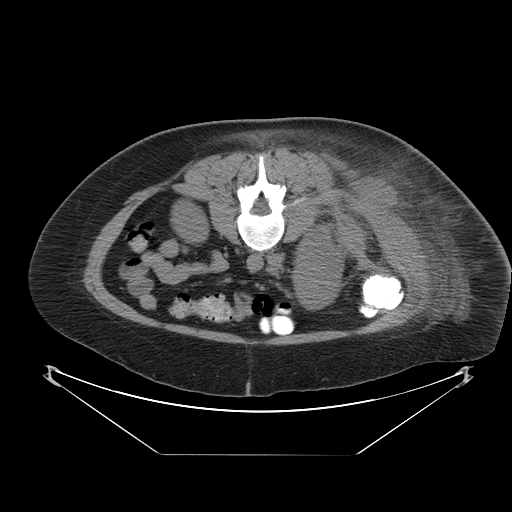
[im 43/120  soft-tissue]
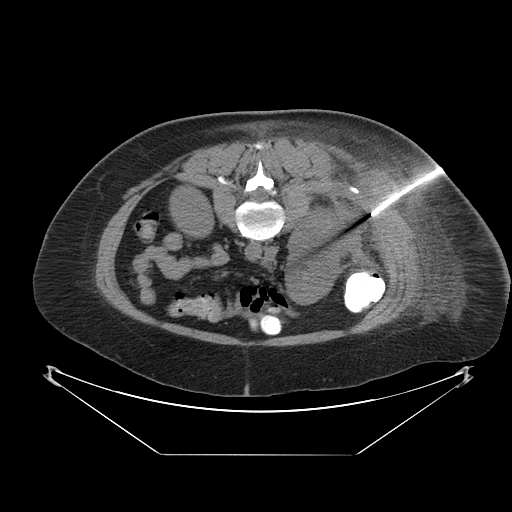
[im 50/120  soft-tissue]
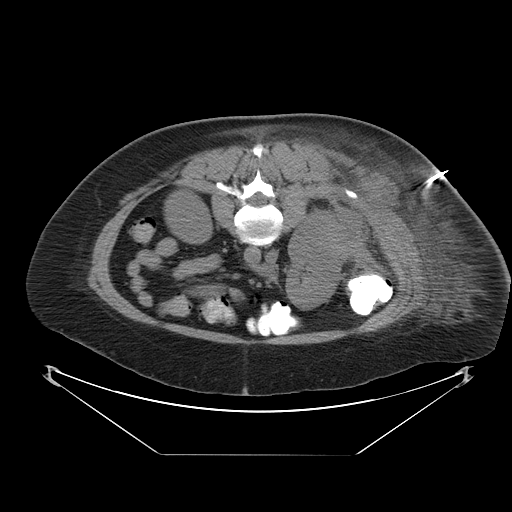
[im 62/120  soft-tissue]
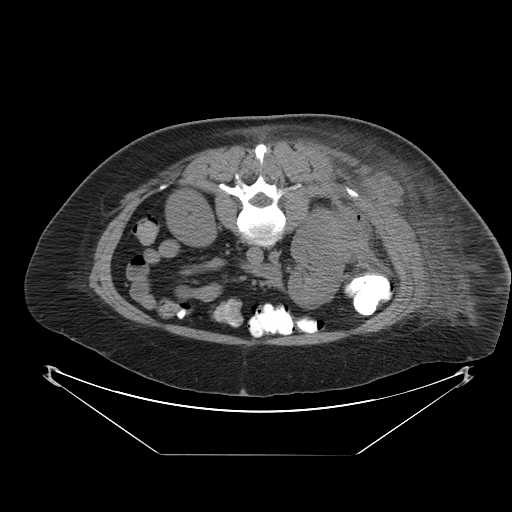
[im 70/120  soft-tissue]
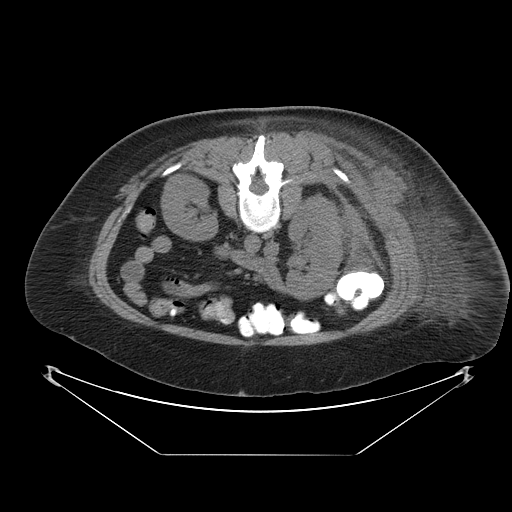
[im 77/120  soft-tissue]
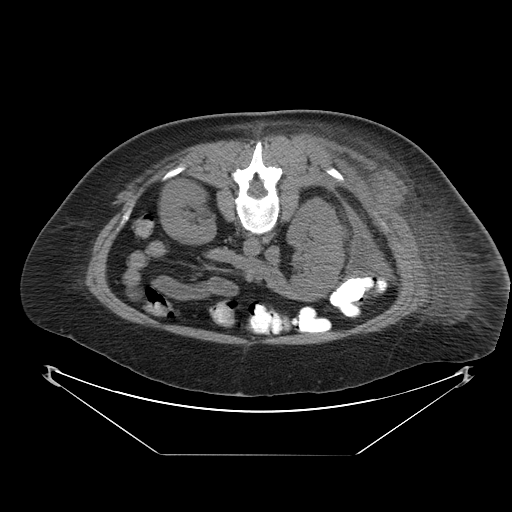
[im 77/120  bone]
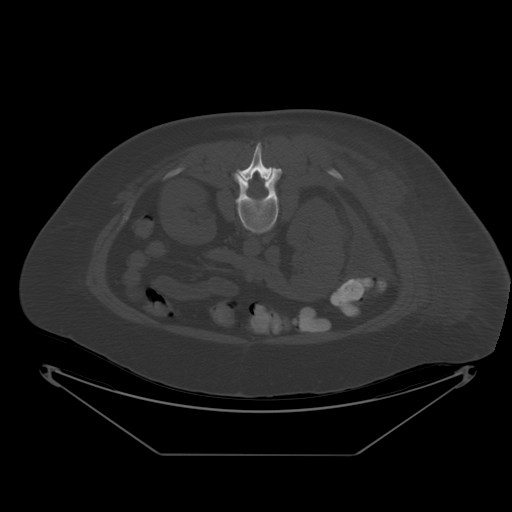
[im 85/120  soft-tissue]
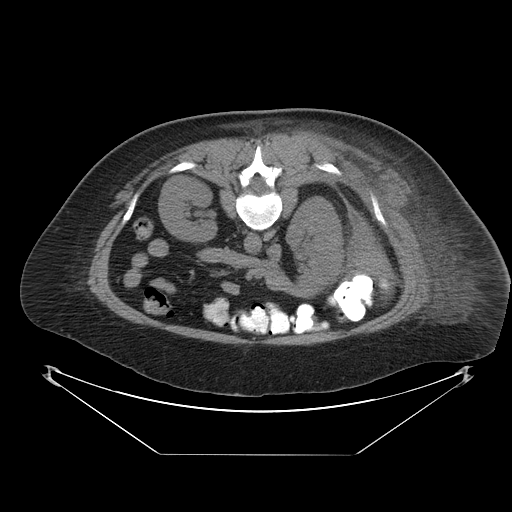
[im 96/120  soft-tissue]
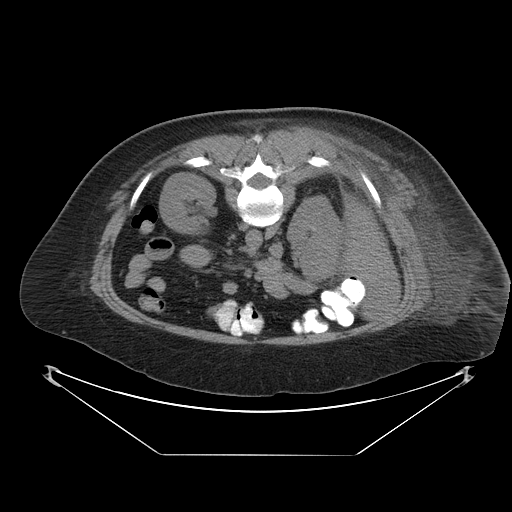
[im 104/120  soft-tissue]
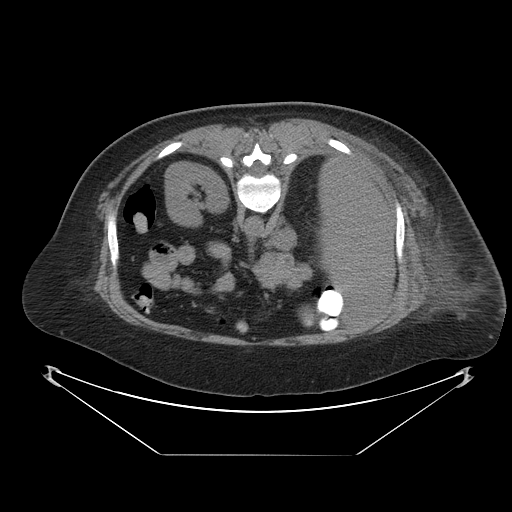
[im 104/120  lung]
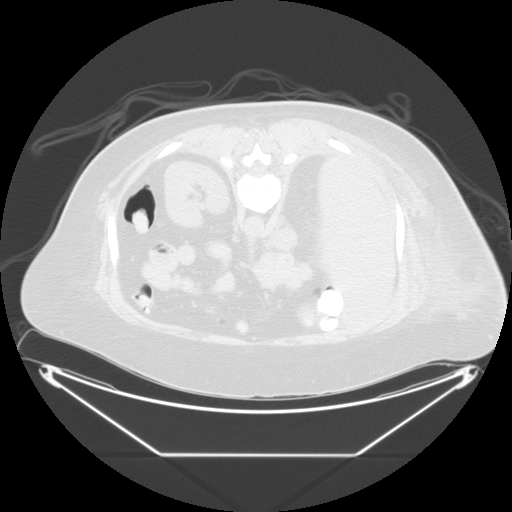
[im 108/120  lung]
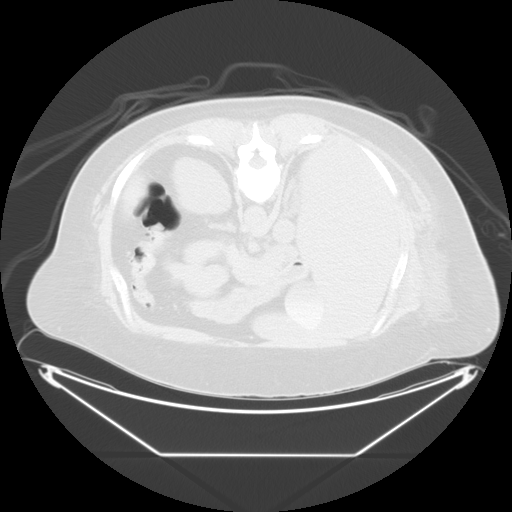
[im 112/120  soft-tissue]
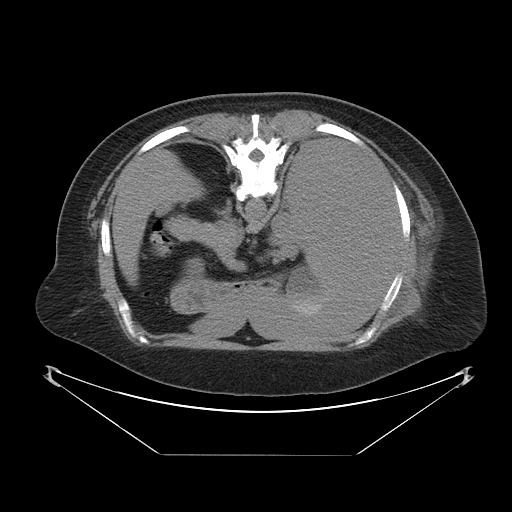
[im 112/120  lung]
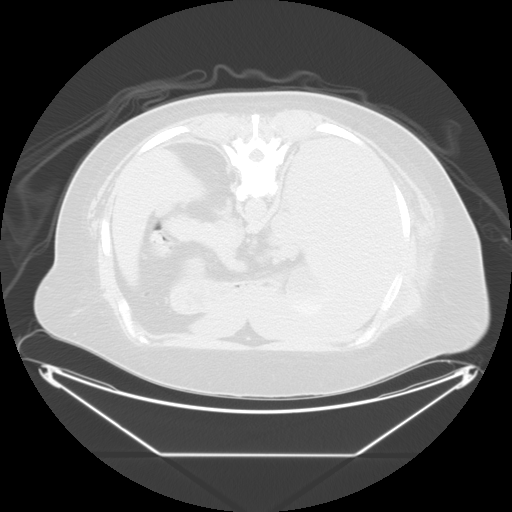
[im 116/120  lung]
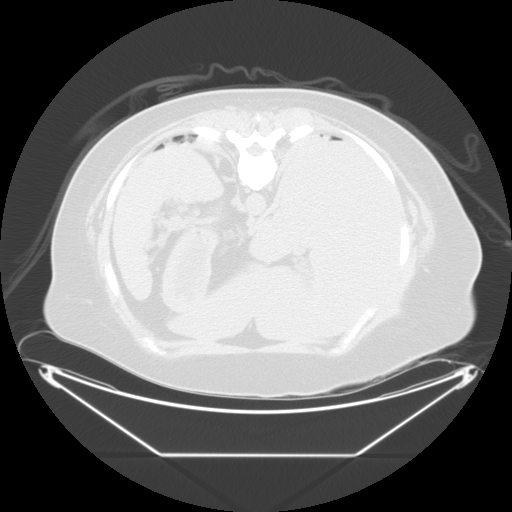

[15 of 32 positions shown; findings below may reference images not displayed]

PROCEDURE(S): CT GUIDED DRAIN PLACEMENT IN THE RIGHT PERINEPHRIC
ABSCESS.

Medications:Versed 7 mg, Fentanyl 250 mcg.

Sedation time:40 minutes

Procedure:Informed consent was obtained for drainage catheter
placement.  The patient was placed prone on the CT examination
table.  CT images demonstrated the right perinephric collection.  A
lateral approach was selected.  The skin was prepped with Betadine
and a sterile drape was placed.  The skin and subcutaneous tissue
was anesthetized with 1% lidocaine.  An 18 gauge needle was
directed into the perinephric collection and a small amount of
purulent bloody fluid was aspirated.  A stiff Amplatz wire was
placed.  The tract was dilated to 10 French.  A 10-French
percutaneous drain was placed within the perinephric collection and
10 ml of bloody purulent fluid was aspirated.  Catheter was placed
to OMAR MUSMAR suction bulb.  The catheter was secured to the skin.
FINDINGS: Right perinephric collection consistent with a perinephric
abscess.  Extensive edema and enlargement of the musculature in the
right flank.  Findings concerning for underlying subcutaneous edema
and possibly cellulitis.
IMPRESSION: CT guided placement of a right perinephric drain.  10 ml
of bloody purulent fluid was removed.

Extensive edema and swelling throughout the right flank
subcutaneous tissues and musculature.  Findings concerning for
underlying inflammation in this area.

## 2008-09-24 IMAGING — CT CT ABDOMEN W/O CM
2 of 4 series · 17 of 46 positions shown, 19 images · IV contrast (OMNI 350, WATER)
Comparison: CT abdomen of 12/13/2007

CLINICAL DATA: Evaluate right perinephric abscess with possible
removal of the percutaneous drain

CT ABDOMEN WITHOUT CONTRAST
TECHNIQUE: Multidetector CT imaging of the abdomen was performed
following the standard protocol without IV contrast.

[Series 3: abd/wout · axial · 0.81mm/px · z∈[-287,-22]mm · 14 of 59 slices shown, 16 images]
[im 3/59  soft-tissue]
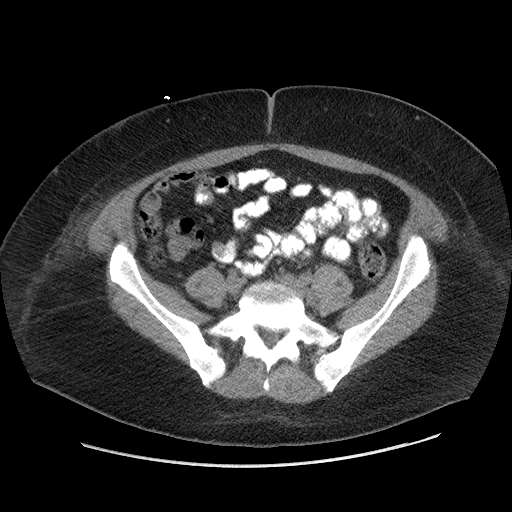
[im 3/59  bone]
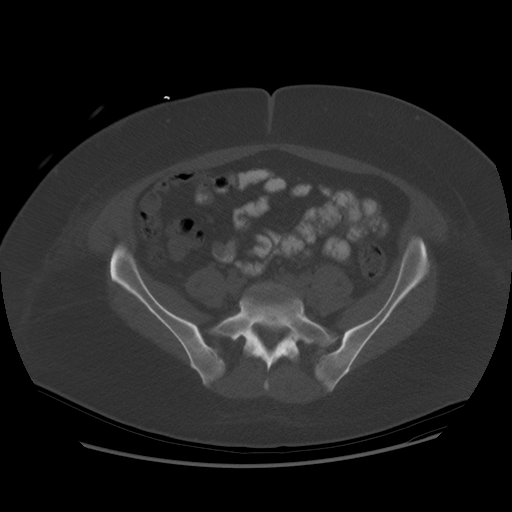
[im 8/59  soft-tissue]
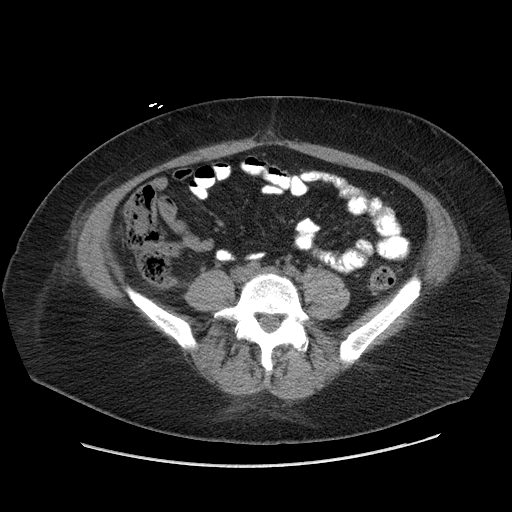
[im 11/59  soft-tissue]
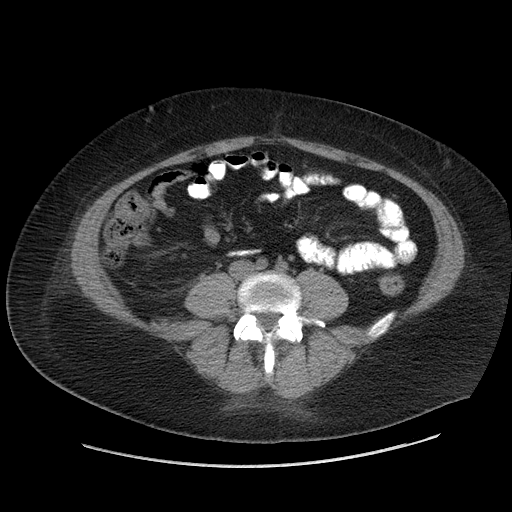
[im 16/59  soft-tissue]
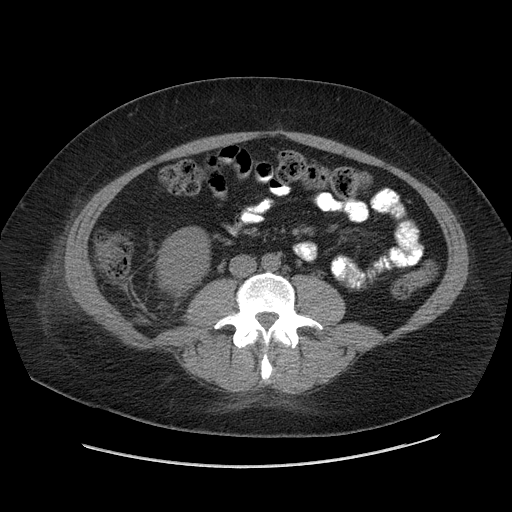
[im 21/59  soft-tissue]
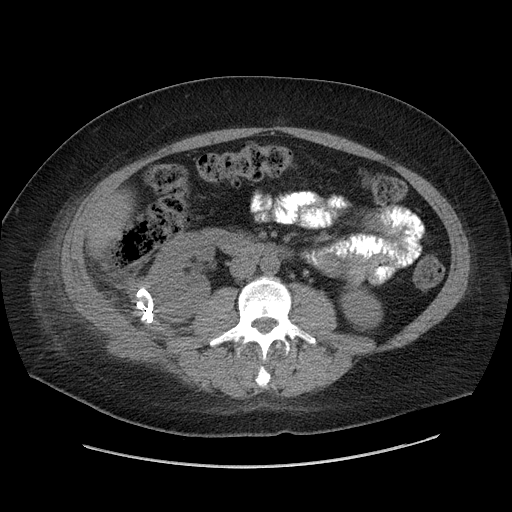
[im 23/59  soft-tissue]
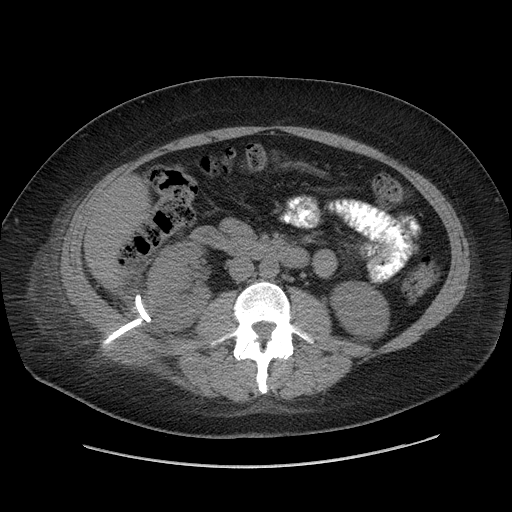
[im 28/59  soft-tissue]
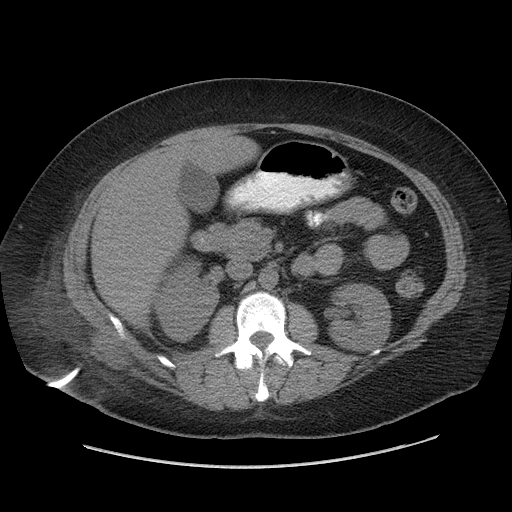
[im 31/59  soft-tissue]
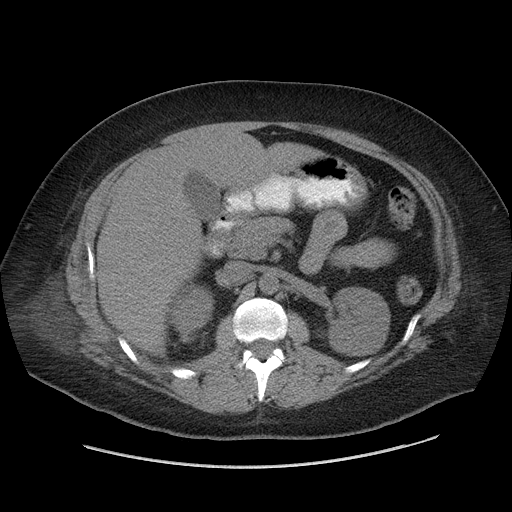
[im 36/59  soft-tissue]
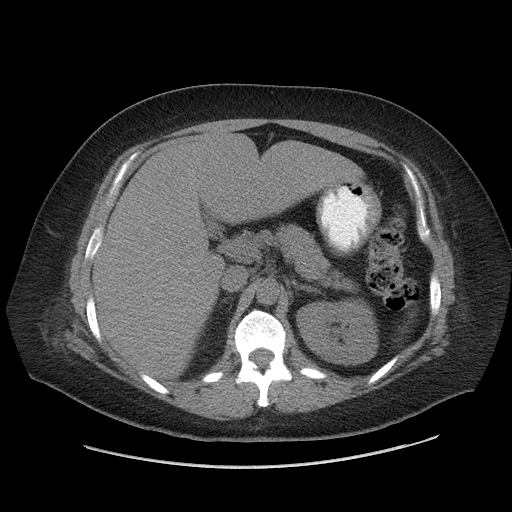
[im 36/59  bone]
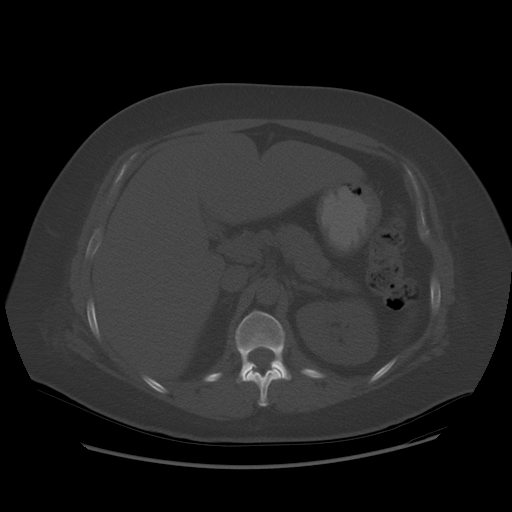
[im 38/59  soft-tissue]
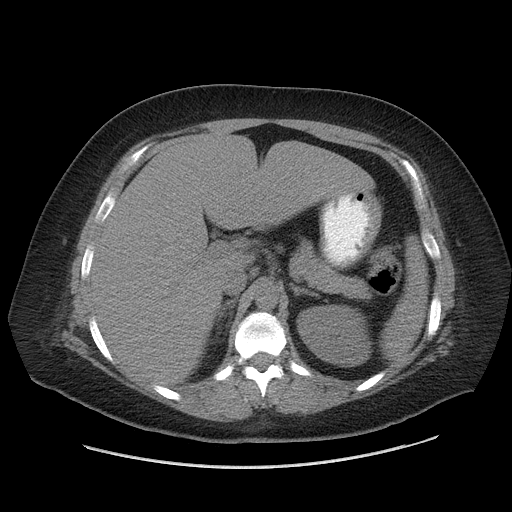
[im 43/59  soft-tissue]
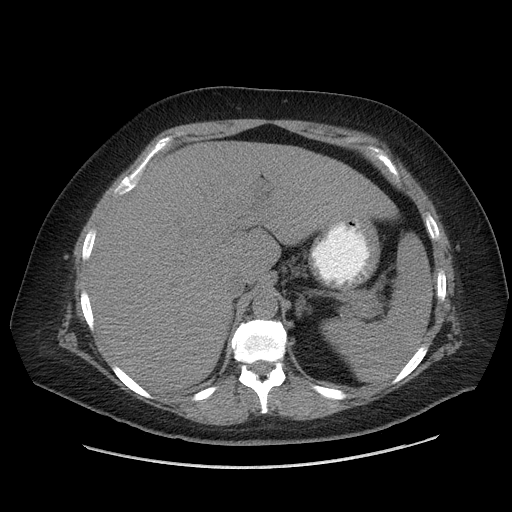
[im 48/59  soft-tissue]
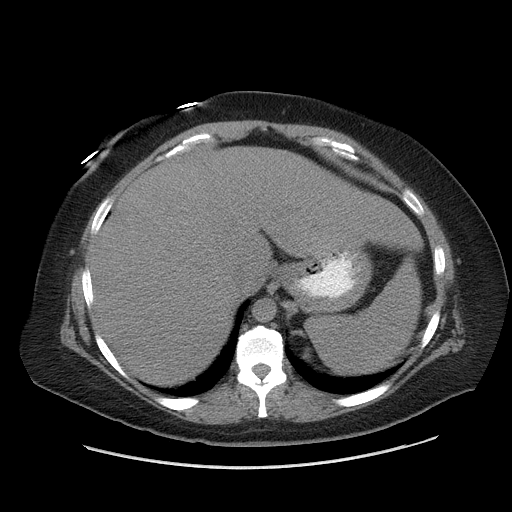
[im 51/59  soft-tissue]
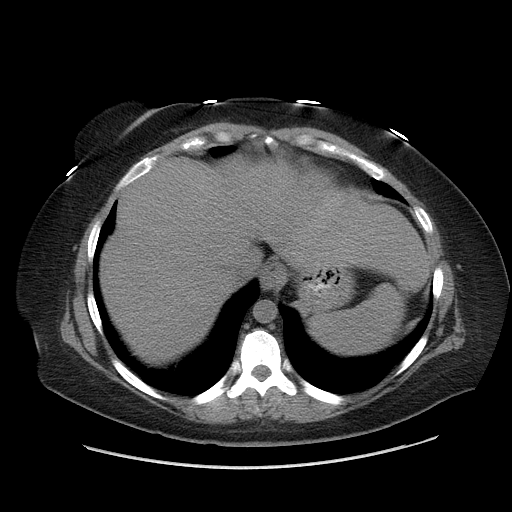
[im 56/59  soft-tissue]
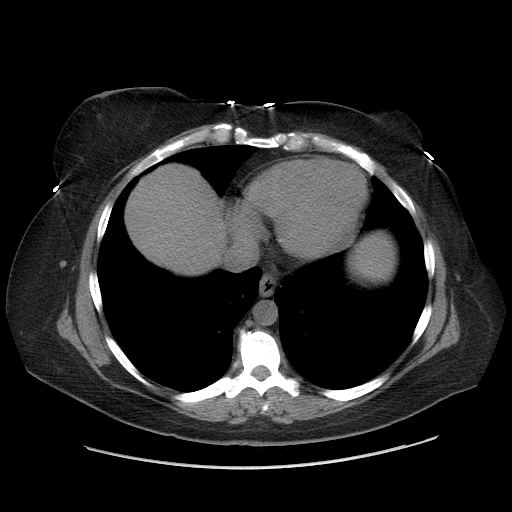

[Series 602: sagittal body · sagittal · 0.81mm/px · 3 of 168 slices shown]
[im 56/168  soft-tissue]
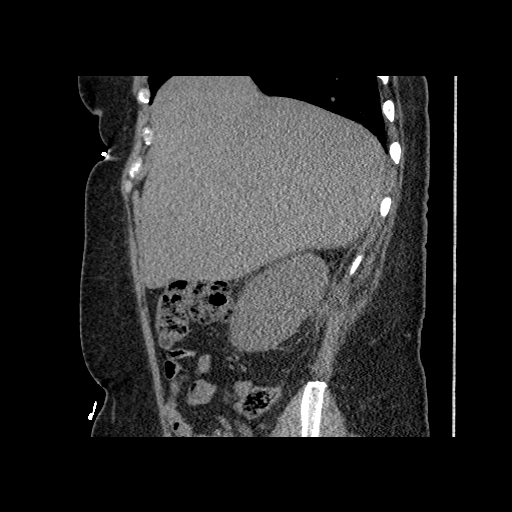
[im 75/168  soft-tissue]
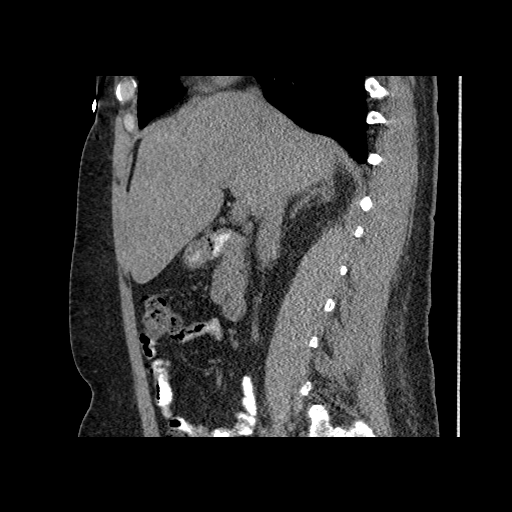
[im 93/168  soft-tissue]
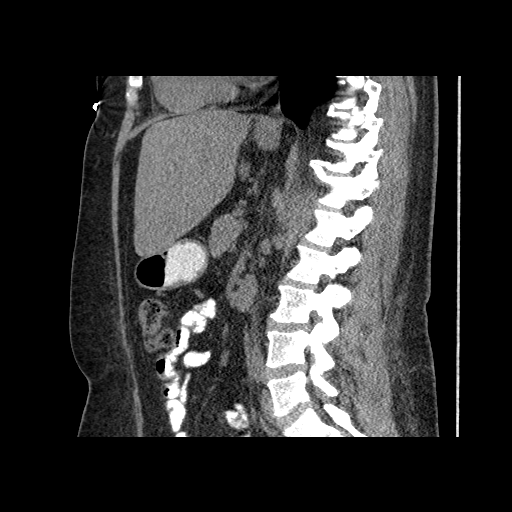

[17 of 46 positions shown; findings below may reference images not displayed]

FINDINGS: The lung bases are clear.  The liver is unchanged in the
unenhanced state.  No calcified gallstones are seen.  The pancreas,
adrenal glands, and spleen appear normal.  A percutaneous drain
remains posterior and lateral to the right kidney and the small
perinephric abscess noted previously has resolved.  The physician's
Latakgomo, Msoja,  is to remove the drain after discussing this case
with the interventional radiologist.  No hydronephrosis is seen.
IMPRESSION: Resolution of right perinephric abscess.  Percutaneous abscess
drainage catheter is to be removed as noted above.

## 2008-10-01 ENCOUNTER — Encounter: Payer: Self-pay | Admitting: Family Medicine

## 2008-10-02 ENCOUNTER — Telehealth: Payer: Self-pay | Admitting: Family Medicine

## 2008-10-03 ENCOUNTER — Ambulatory Visit: Payer: Self-pay | Admitting: Family Medicine

## 2008-10-06 ENCOUNTER — Encounter: Payer: Self-pay | Admitting: Family Medicine

## 2008-10-17 ENCOUNTER — Telehealth: Payer: Self-pay | Admitting: Family Medicine

## 2008-10-18 ENCOUNTER — Emergency Department (HOSPITAL_COMMUNITY): Admission: EM | Admit: 2008-10-18 | Discharge: 2008-10-19 | Payer: Self-pay | Admitting: *Deleted

## 2008-10-20 ENCOUNTER — Inpatient Hospital Stay (HOSPITAL_COMMUNITY): Admission: EM | Admit: 2008-10-20 | Discharge: 2008-10-21 | Payer: Self-pay | Admitting: Emergency Medicine

## 2008-10-20 ENCOUNTER — Telehealth: Payer: Self-pay | Admitting: *Deleted

## 2008-10-20 ENCOUNTER — Ambulatory Visit: Payer: Self-pay | Admitting: Family Medicine

## 2008-10-21 ENCOUNTER — Telehealth (INDEPENDENT_AMBULATORY_CARE_PROVIDER_SITE_OTHER): Payer: Self-pay | Admitting: *Deleted

## 2008-10-21 ENCOUNTER — Telehealth: Payer: Self-pay | Admitting: Internal Medicine

## 2008-10-22 ENCOUNTER — Ambulatory Visit: Payer: Self-pay | Admitting: Family Medicine

## 2008-10-24 ENCOUNTER — Ambulatory Visit: Payer: Self-pay | Admitting: Psychology

## 2008-10-30 ENCOUNTER — Ambulatory Visit: Payer: Self-pay | Admitting: Psychology

## 2008-11-06 ENCOUNTER — Ambulatory Visit: Payer: Self-pay | Admitting: Family Medicine

## 2008-11-11 ENCOUNTER — Ambulatory Visit: Payer: Self-pay | Admitting: Psychology

## 2008-11-11 DIAGNOSIS — F4320 Adjustment disorder, unspecified: Secondary | ICD-10-CM | POA: Insufficient documentation

## 2008-11-24 ENCOUNTER — Ambulatory Visit: Payer: Self-pay | Admitting: Family Medicine

## 2008-11-24 ENCOUNTER — Encounter: Payer: Self-pay | Admitting: Psychology

## 2008-11-24 LAB — CONVERTED CEMR LAB: Hgb A1c MFr Bld: 11.3 %

## 2008-12-01 ENCOUNTER — Ambulatory Visit: Payer: Self-pay | Admitting: Psychology

## 2008-12-03 ENCOUNTER — Telehealth: Payer: Self-pay | Admitting: Psychology
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
## 2008-12-15 ENCOUNTER — Ambulatory Visit: Payer: Self-pay | Admitting: Family Medicine

## 2008-12-19 ENCOUNTER — Telehealth: Payer: Self-pay | Admitting: Family Medicine

## 2008-12-29 ENCOUNTER — Telehealth (INDEPENDENT_AMBULATORY_CARE_PROVIDER_SITE_OTHER): Payer: Self-pay | Admitting: *Deleted

## 2009-01-13 ENCOUNTER — Telehealth: Payer: Self-pay | Admitting: Family Medicine

## 2009-01-28 ENCOUNTER — Encounter: Payer: Self-pay | Admitting: Family Medicine

## 2009-01-29 ENCOUNTER — Ambulatory Visit: Payer: Self-pay | Admitting: Family Medicine

## 2009-01-29 DIAGNOSIS — K219 Gastro-esophageal reflux disease without esophagitis: Secondary | ICD-10-CM | POA: Insufficient documentation

## 2009-02-06 ENCOUNTER — Telehealth (INDEPENDENT_AMBULATORY_CARE_PROVIDER_SITE_OTHER): Payer: Self-pay | Admitting: *Deleted

## 2009-02-09 ENCOUNTER — Ambulatory Visit: Payer: Self-pay | Admitting: Family Medicine

## 2009-02-09 ENCOUNTER — Telehealth: Payer: Self-pay | Admitting: Family Medicine

## 2009-02-25 ENCOUNTER — Telehealth: Payer: Self-pay | Admitting: Family Medicine

## 2009-02-25 ENCOUNTER — Ambulatory Visit: Payer: Self-pay | Admitting: Family Medicine

## 2009-02-25 LAB — CONVERTED CEMR LAB: Hgb A1c MFr Bld: 12.2 %

## 2009-04-24 IMAGING — CR DG CHEST 2V
2 series · 2 of 2 positions shown · non-contrast
Comparison: 12/13/2007

CLINICAL DATA: Chest pain, syncope

CHEST - 2 VIEW

[w chest pa]
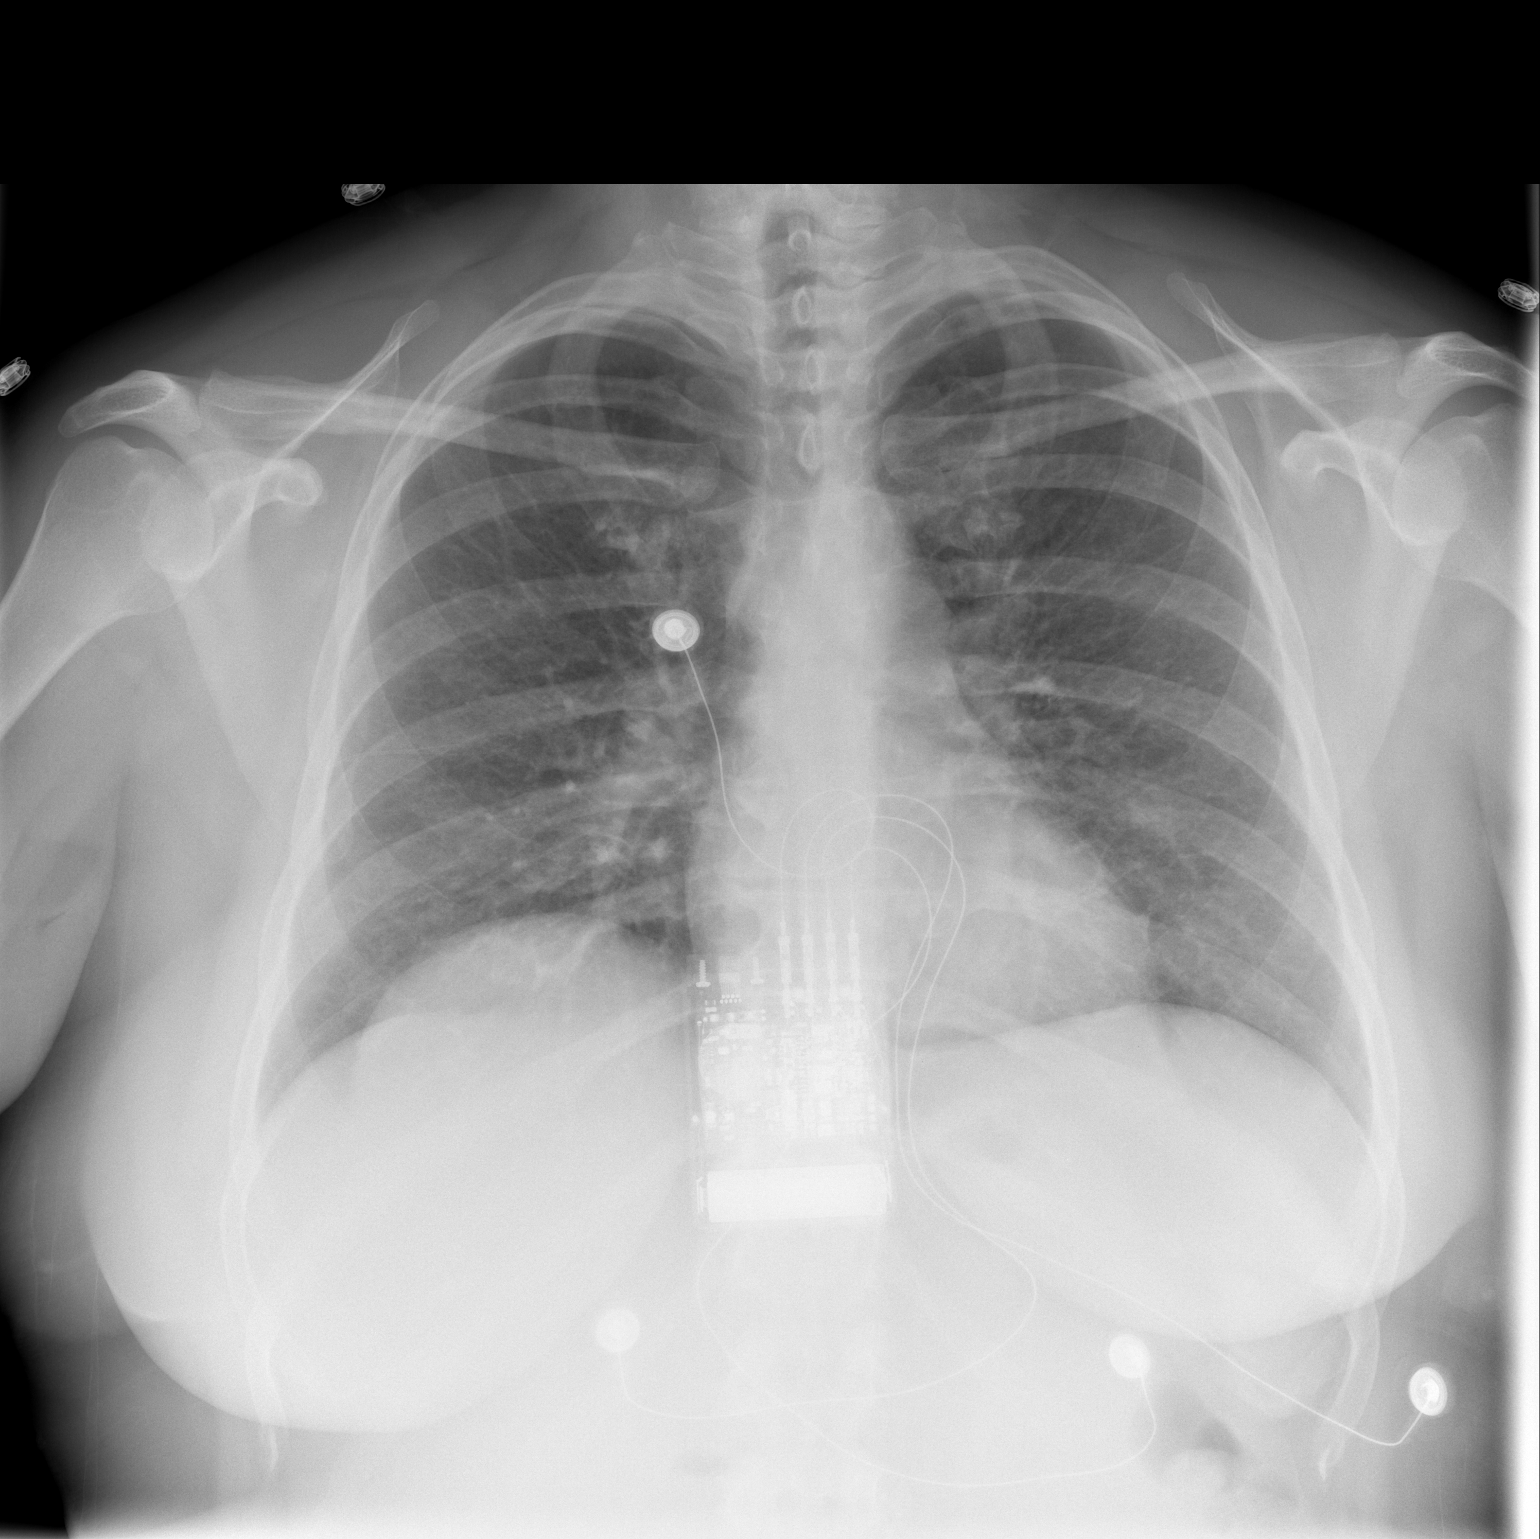

[w chest lat]
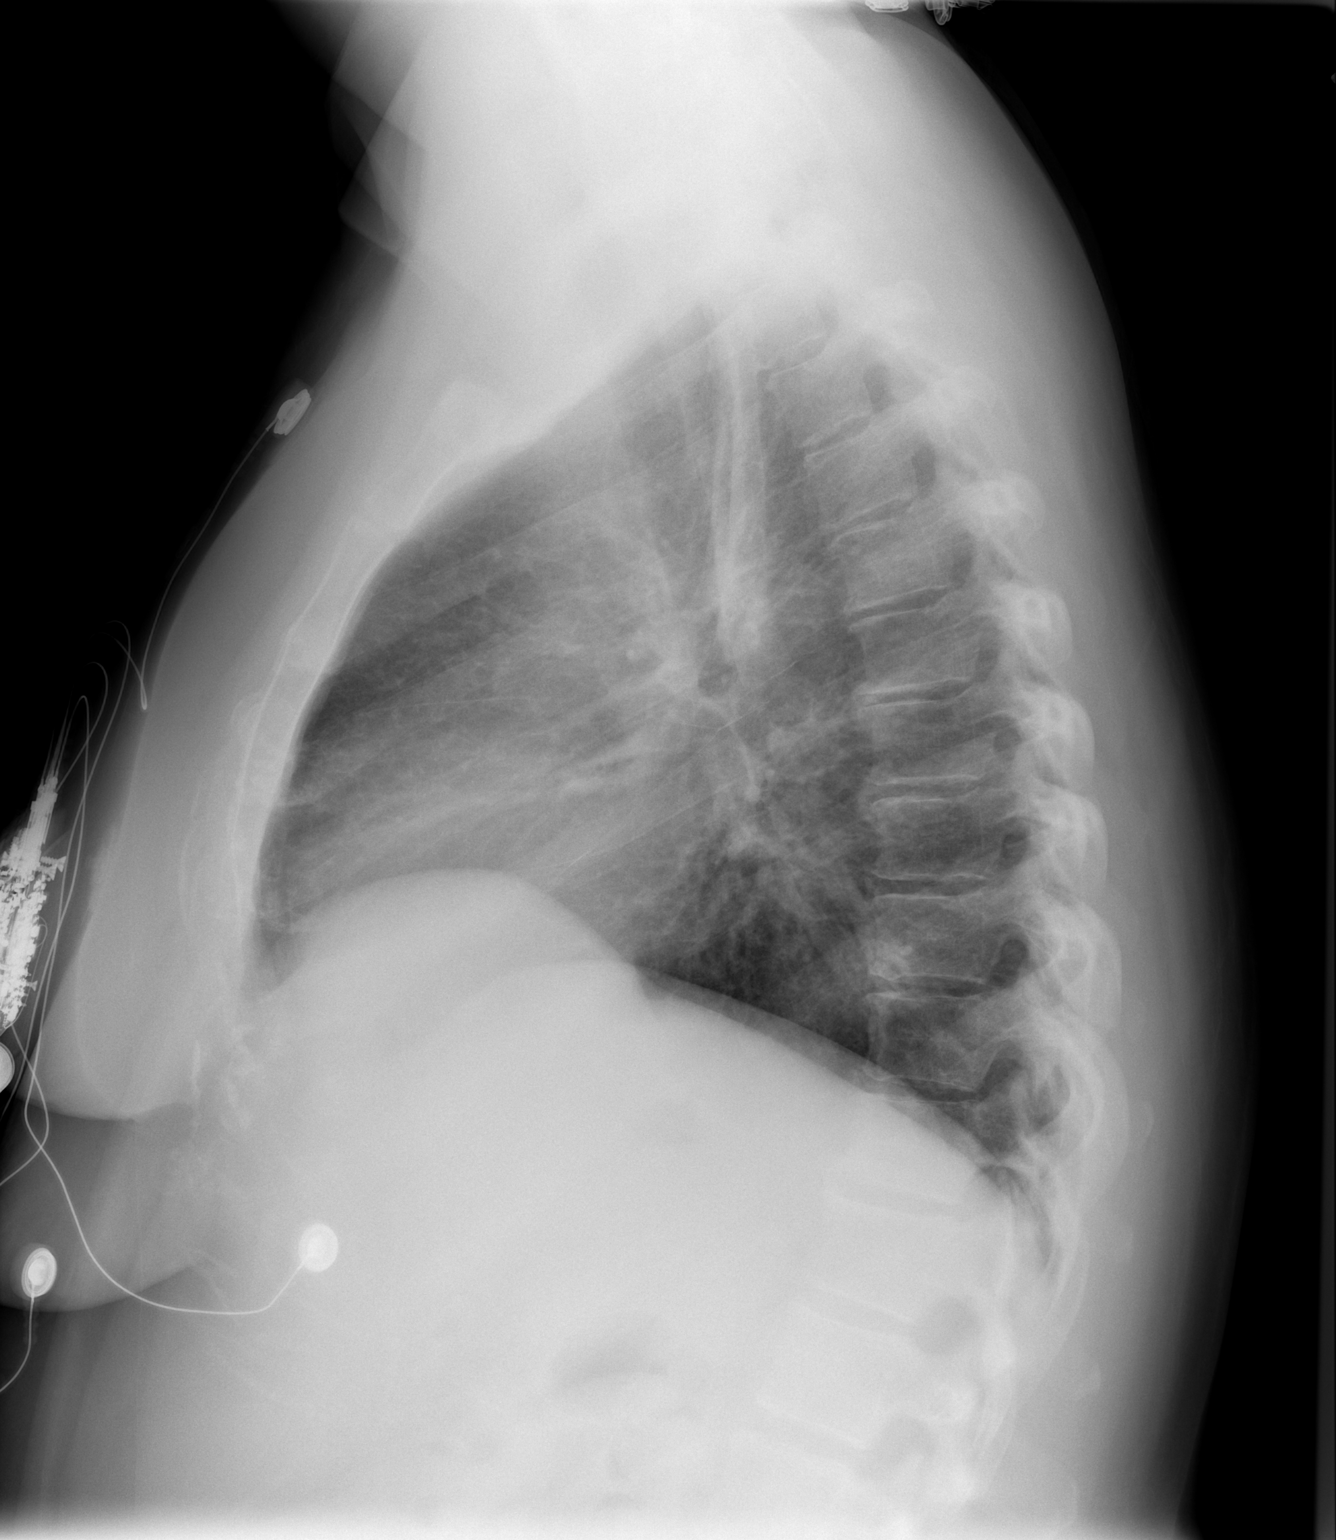

[2 of 2 positions shown; findings below may reference images not displayed]

FINDINGS: Cardiomediastinal silhouette is stable.  No acute
infiltrate or edema.  Stable right basilar atelectasis or scarring.
Midline lower anterior chest defibrillator device is noted.
Degenerative changes thoracic spine.  Mild elevation of the right
hemidiaphragm.
IMPRESSION: No acute infiltrate or edema.  Stable right basilar atelectasis or
scarring.  Mild degenerative changes thoracic spine.
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
## 2009-04-27 ENCOUNTER — Telehealth: Payer: Self-pay | Admitting: Family Medicine

## 2009-04-28 ENCOUNTER — Ambulatory Visit: Payer: Self-pay | Admitting: Family Medicine

## 2009-04-30 ENCOUNTER — Encounter: Payer: Self-pay | Admitting: Family Medicine

## 2009-04-30 ENCOUNTER — Ambulatory Visit: Payer: Self-pay | Admitting: Family Medicine

## 2009-05-05 ENCOUNTER — Encounter: Payer: Self-pay | Admitting: *Deleted

## 2009-05-05 LAB — CONVERTED CEMR LAB
Direct LDL: 145 mg/dL — ABNORMAL HIGH
TSH: 0.793 microintl units/mL (ref 0.350–4.500)

## 2009-05-13 ENCOUNTER — Telehealth: Payer: Self-pay | Admitting: Family Medicine

## 2009-05-13 ENCOUNTER — Emergency Department (HOSPITAL_COMMUNITY): Admission: EM | Admit: 2009-05-13 | Discharge: 2009-05-13 | Payer: Self-pay | Admitting: Family Medicine

## 2009-05-22 ENCOUNTER — Encounter: Payer: Self-pay | Admitting: Family Medicine

## 2009-05-26 ENCOUNTER — Encounter: Payer: Self-pay | Admitting: Family Medicine

## 2009-06-08 ENCOUNTER — Telehealth: Payer: Self-pay | Admitting: Family Medicine

## 2009-06-10 ENCOUNTER — Ambulatory Visit: Payer: Self-pay | Admitting: Family Medicine

## 2009-06-10 LAB — CONVERTED CEMR LAB: Hgb A1c MFr Bld: 12.8 %

## 2009-07-07 ENCOUNTER — Telehealth: Payer: Self-pay | Admitting: Family Medicine
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
## 2009-07-20 IMAGING — CT CT HEAD W/O CM
1 series · 16 of 24 positions shown, 20 images · non-contrast
Comparison: 10/18/2008

CLINICAL DATA: Mental status changes, weakness

CT HEAD WITHOUT CONTRAST
TECHNIQUE: Contiguous axial images were obtained from the base of
the skull through the vertex without contrast.

[Series 2: brain · axial · 0.47mm/px · z∈[+152,+263]mm · 16 of 24 slices shown, 20 images]
[im 2/24  brain]
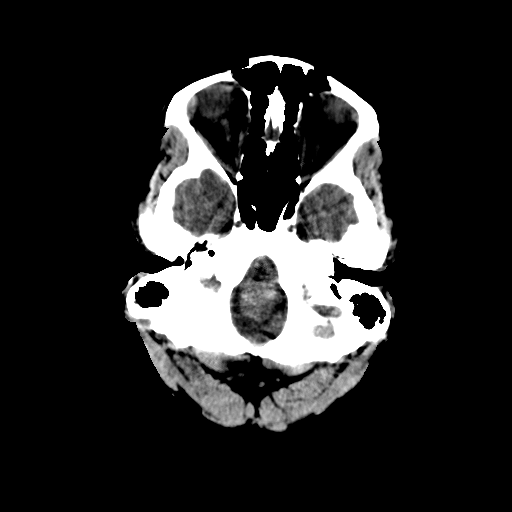
[im 2/24  bone]
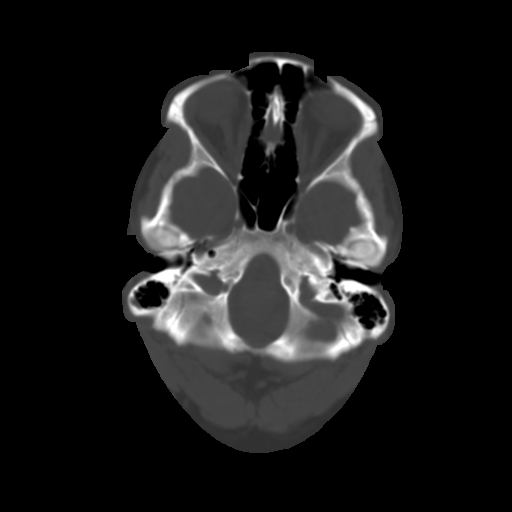
[im 4/24  brain]
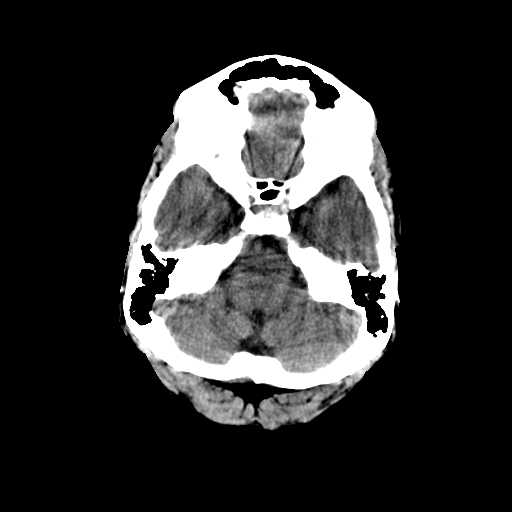
[im 5/24  brain]
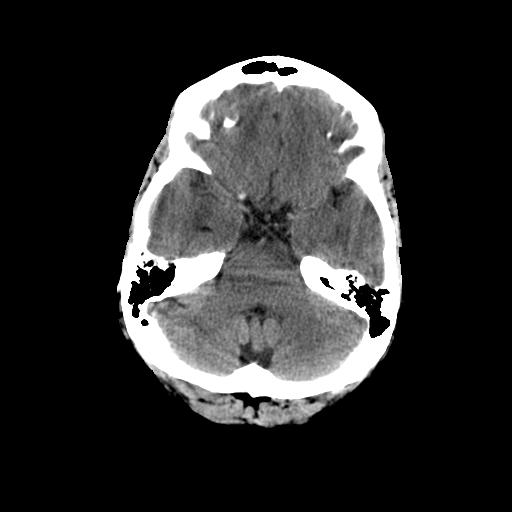
[im 6/24  brain]
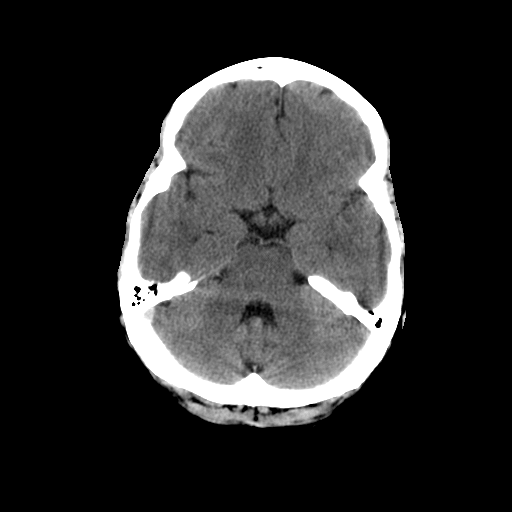
[im 8/24  brain]
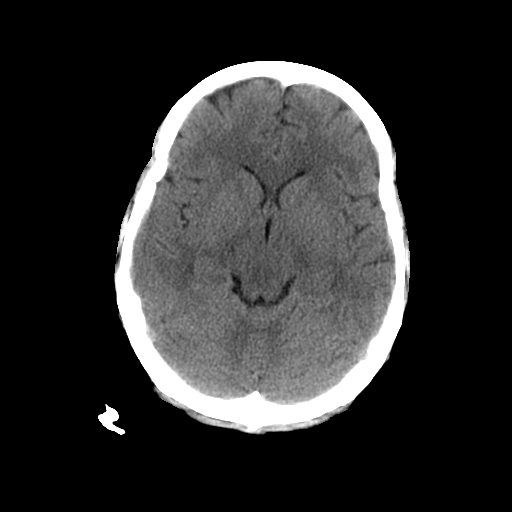
[im 8/24  bone]
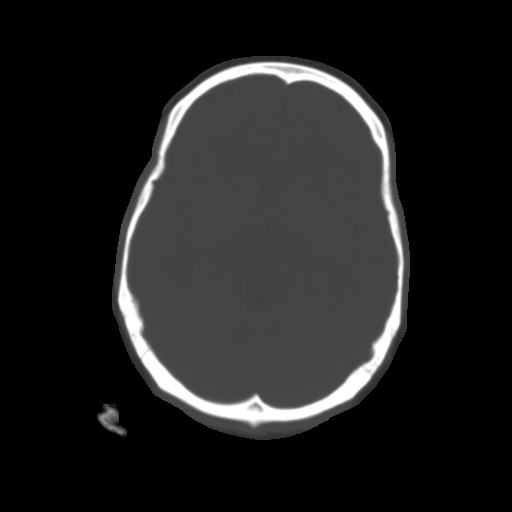
[im 9/24  brain]
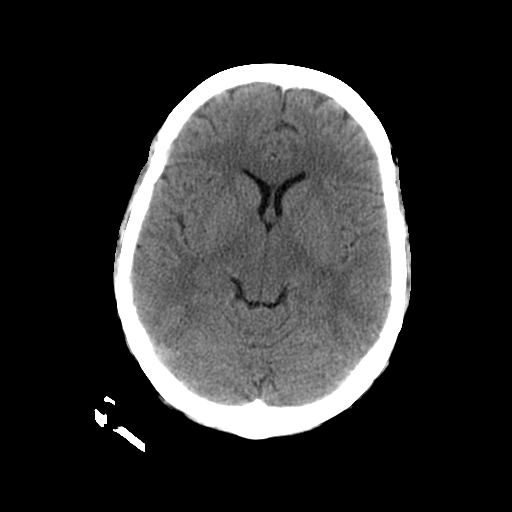
[im 10/24  brain]
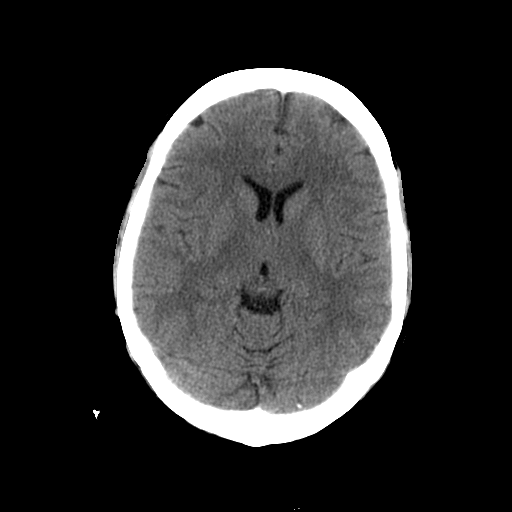
[im 12/24  brain]
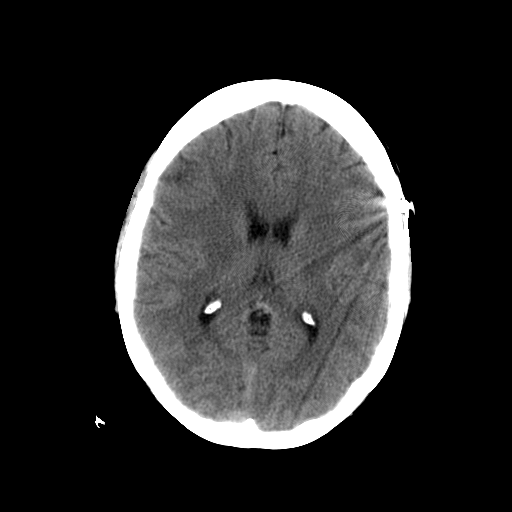
[im 13/24  brain]
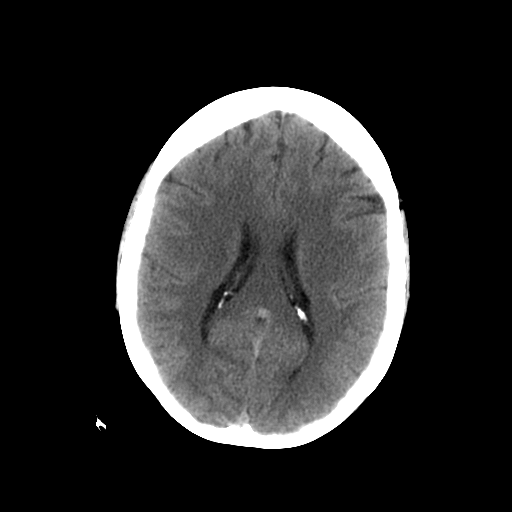
[im 13/24  bone]
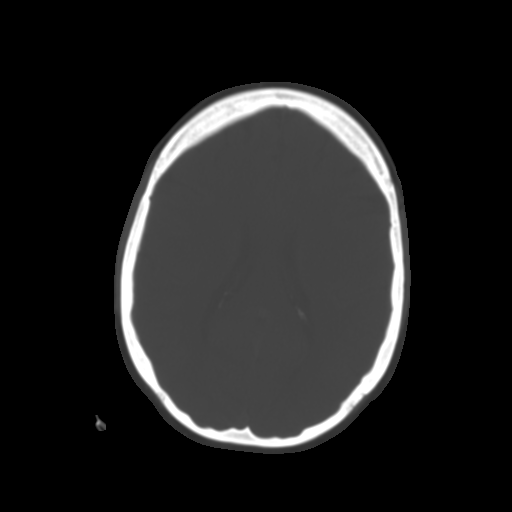
[im 15/24  brain]
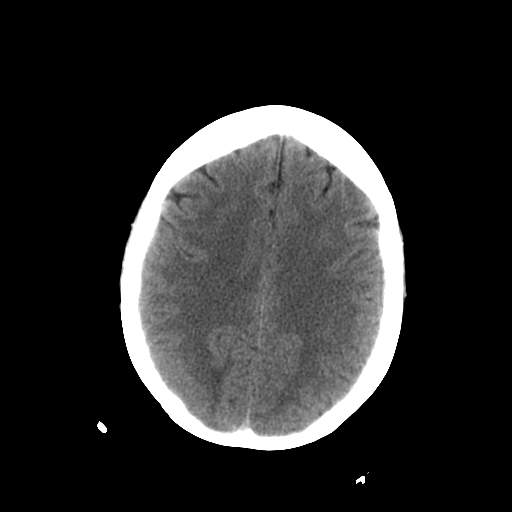
[im 16/24  brain]
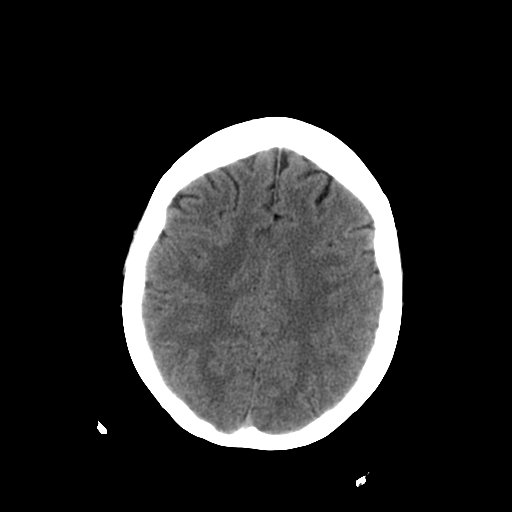
[im 17/24  brain]
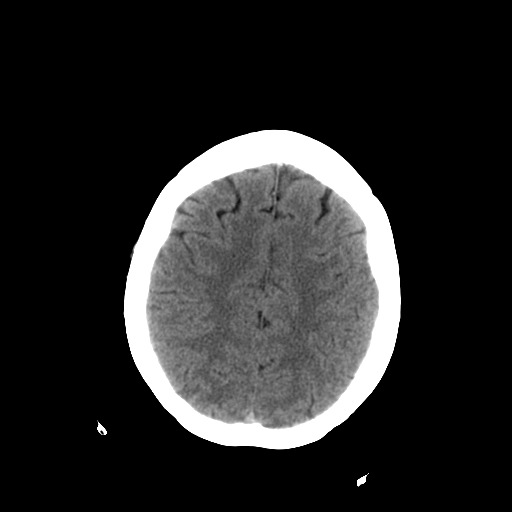
[im 19/24  brain]
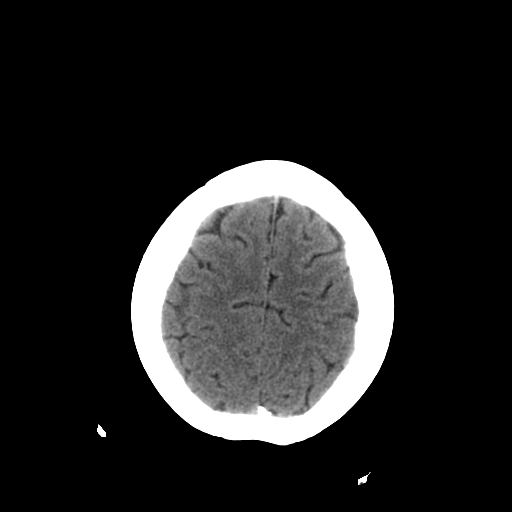
[im 19/24  bone]
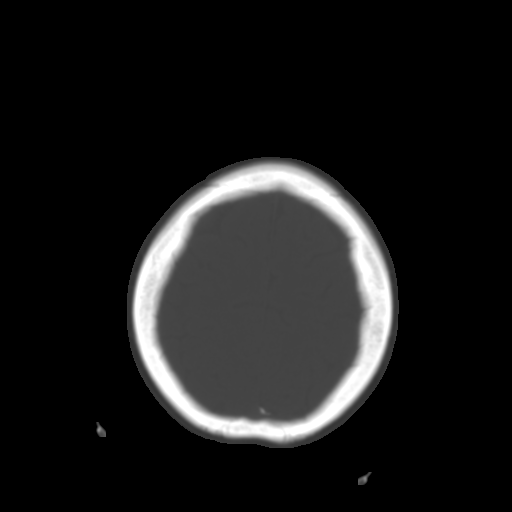
[im 20/24  brain]
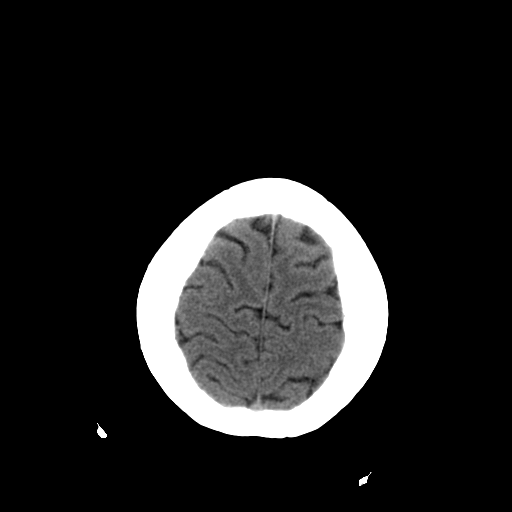
[im 21/24  brain]
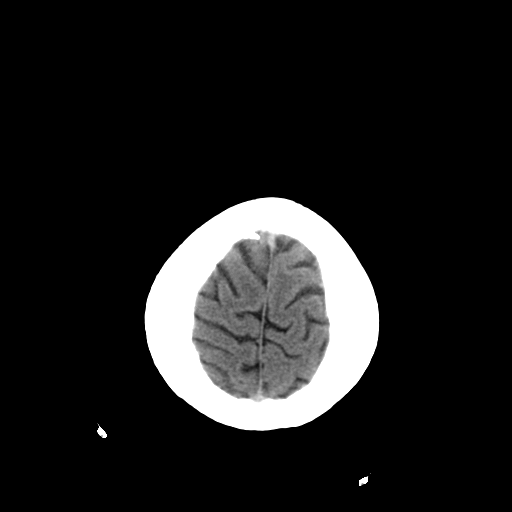
[im 23/24  brain]
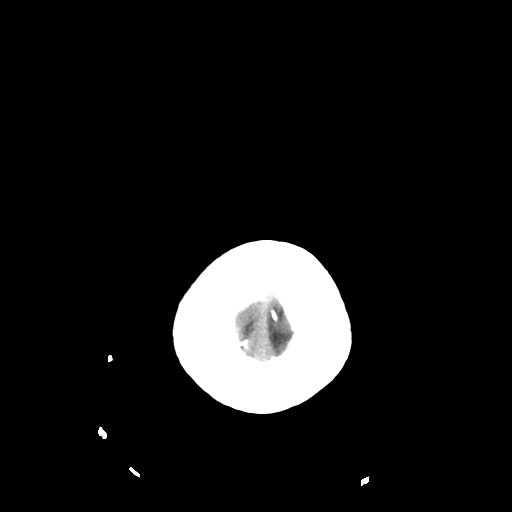

[16 of 24 positions shown; findings below may reference images not displayed]

FINDINGS: There is no evidence of acute intracranial hemorrhage,
brain edema, mass lesion, acute infarction,   mass effect, or
midline shift. Acute infarct may be inapparent on noncontrast CT.
No other intra-axial abnormalities are seen, and the ventricles and
sulci are within normal limits in size and symmetry.   No abnormal
extra-axial fluid collections or masses are identified.  No
significant calvarial abnormality.
IMPRESSION: Negative for bleed or other acute intracranial process.

## 2009-07-30 ENCOUNTER — Encounter: Payer: Self-pay | Admitting: Family Medicine

## 2009-12-04 ENCOUNTER — Emergency Department (HOSPITAL_COMMUNITY)
Admission: EM | Admit: 2009-12-04 | Discharge: 2009-12-04 | Payer: Self-pay | Source: Home / Self Care | Admitting: Emergency Medicine

## 2009-12-04 ENCOUNTER — Encounter: Payer: Self-pay | Admitting: Family Medicine

## 2010-01-15 ENCOUNTER — Emergency Department (HOSPITAL_COMMUNITY)
Admission: EM | Admit: 2010-01-15 | Discharge: 2010-01-15 | Payer: Self-pay | Source: Home / Self Care | Admitting: Family Medicine

## 2010-01-26 ENCOUNTER — Emergency Department (HOSPITAL_COMMUNITY): Admission: EM | Admit: 2010-01-26 | Discharge: 2010-01-27 | Payer: Self-pay | Admitting: Emergency Medicine

## 2010-03-02 ENCOUNTER — Ambulatory Visit: Payer: Self-pay | Admitting: Family Medicine

## 2010-03-02 ENCOUNTER — Encounter: Payer: Self-pay | Admitting: Sports Medicine

## 2010-03-02 ENCOUNTER — Telehealth: Payer: Self-pay | Admitting: Family Medicine

## 2010-03-02 LAB — CONVERTED CEMR LAB
ALT: 8 units/L (ref 0–35)
AST: 8 units/L (ref 0–37)
Albumin: 4.2 g/dL (ref 3.5–5.2)
Alkaline Phosphatase: 104 units/L (ref 39–117)
BUN: 13 mg/dL (ref 6–23)
CO2: 26 meq/L (ref 19–32)
Calcium: 9.8 mg/dL (ref 8.4–10.5)
Chloride: 94 meq/L — ABNORMAL LOW (ref 96–112)
Creatinine, Ser: 0.79 mg/dL (ref 0.40–1.20)
Glucose, Bld: 421 mg/dL — ABNORMAL HIGH (ref 70–99)
Potassium: 4.4 meq/L (ref 3.5–5.3)
Sodium: 132 meq/L — ABNORMAL LOW (ref 135–145)
Total Bilirubin: 0.3 mg/dL (ref 0.3–1.2)
Total Protein: 7.5 g/dL (ref 6.0–8.3)

## 2010-03-03 ENCOUNTER — Encounter: Payer: Self-pay | Admitting: Sports Medicine

## 2010-03-04 ENCOUNTER — Telehealth: Payer: Self-pay | Admitting: Sports Medicine

## 2010-03-05 ENCOUNTER — Telehealth: Payer: Self-pay | Admitting: *Deleted

## 2010-03-05 ENCOUNTER — Ambulatory Visit (HOSPITAL_COMMUNITY)
Admission: RE | Admit: 2010-03-05 | Discharge: 2010-03-05 | Payer: Self-pay | Source: Home / Self Care | Admitting: Sports Medicine

## 2010-03-08 ENCOUNTER — Ambulatory Visit: Payer: Self-pay | Admitting: Family Medicine

## 2010-03-12 ENCOUNTER — Ambulatory Visit: Payer: Self-pay | Admitting: Family Medicine

## 2010-03-19 ENCOUNTER — Ambulatory Visit: Payer: Self-pay | Admitting: Family Medicine

## 2010-03-19 ENCOUNTER — Emergency Department (HOSPITAL_COMMUNITY)
Admission: EM | Admit: 2010-03-19 | Discharge: 2010-03-19 | Payer: Self-pay | Source: Home / Self Care | Admitting: Emergency Medicine

## 2010-03-19 LAB — CONVERTED CEMR LAB: Hgb A1c MFr Bld: 11.5 %

## 2010-03-30 ENCOUNTER — Telehealth: Payer: Self-pay | Admitting: Family Medicine

## 2010-04-21 ENCOUNTER — Telehealth: Payer: Self-pay | Admitting: Family Medicine

## 2010-05-27 ENCOUNTER — Telehealth: Payer: Self-pay | Admitting: Family Medicine

## 2010-06-08 ENCOUNTER — Ambulatory Visit: Payer: Self-pay | Admitting: Family Medicine

## 2010-06-10 ENCOUNTER — Telehealth: Payer: Self-pay | Admitting: Family Medicine

## 2010-06-24 ENCOUNTER — Ambulatory Visit: Payer: Self-pay | Admitting: Family Medicine

## 2010-06-24 LAB — CONVERTED CEMR LAB: Hgb A1c MFr Bld: 11 %

## 2010-07-19 ENCOUNTER — Encounter: Payer: Self-pay | Admitting: Family Medicine

## 2010-08-01 ENCOUNTER — Encounter: Payer: Self-pay | Admitting: Urology

## 2010-08-12 NOTE — Assessment & Plan Note (Signed)
Summary: CP, syncope, anxiety, DM II   Vital Signs:  Patient Profile:   40 Years Old Female Height:     66.25 inches Weight:      225 pounds BMI:     36.17 Temp:     98.7 degrees F Pulse rate:   98 / minute BP sitting:   134 / 83  Pt. in pain?   yes    Location:   chest    Intensity:   5  Vitals Entered By: Theresia Lo RN (July 14, 2008 10:15 AM)              Is Patient Diabetic? Yes     PCP:  [name]  MD  Chief Complaint:  syncope.  History of Present Illness: 40yo F w/ complaints of more frequent syncope.  Syncope: Pt was initially seen in the clinic today by Dr. Raymondo Band to manage uncontrolled DM II.  Incidentally, she c/o more frequent syncopal episodes with chest pain.  During the office visit, while obtaining an EKG, she c/o CP and SOB with tingling in her neck.  Lasted less than 10 minutes.  Occurred at rest and resolved on its own.      Current Allergies: ! PENICILLIN ! * SINGULAIR    Risk Factors:    Review of Systems      See HPI   Physical Exam  General:     Non ill appearing, A&O x3, able to communicate clearly, initially wheezing but improved spontaneously  Eyes:     EOMI, PERRLA Lungs:     Lungs are clear to auscultation, no crackles or wheezes. Heart:     Normal rate and regular rhythm. S1 and S2 normal without gallop, murmur, click, rub or other extra sounds. Pulses:     2+ dp Extremities:     no edema Neurologic:     no focal deficits Additional Exam:     EKG- Nl sinus rhythm; questionable q wave in Lead I; no ST or T wave changes    Impression & Recommendations:  Problem # 1:  ANXIETY STATE, UNSPECIFIED (ICD-300.00) Assessment: Unchanged I think her acute symptoms of dyspnea and CP is c/w panic attacks/anxiety.  She has many stressors.  Given her risk factors, I cannot completely r/o cardiac causes.  Will cycle cardiac enzymes today.  She has had a negative cardiac w/u within the past year.  Will continue on the  Fluoxetine.  Would like to switch her to something else, but financially, I am constrained from making too many changes.  Will have her f/u with me in 1 month.   Her updated medication list for this problem includes:    Fluoxetine Hcl 40 Mg Caps (Fluoxetine hcl) ..... One tablet by mouth daily  Orders: [facility]- Est  Level 4 (04540)   Problem # 2:  SYNCOPE (ICD-780.2) Assessment: Deteriorated Worsening and more frequent episodes.  Will check a TSH.  EKG conveyed no arrhythmias.  Will refer her to see Cardiology for heart monitoring.  Again, I think this may be anxiety related or psych related.     Orders: TSH-[facility] ([id number]) Miscellaneous Lab Charge-[facility] [phone]) EKG- [facility] (EKG) Cardiology Referral (Cardiology) [facility]- Est  Level 4 (30865)   Problem # 3:  DM W/NEURO MNFST, TYPE II, UNCONTROLLED (ICD-250.62) Assessment: Unchanged See Dr. Macky Lower office note.  Many changes were made to medication list.   The following medications were removed from the medication list:    Glipizide 10 Mg Tabs (Glipizide) .Marland Kitchen... Take  one tablet by mouth two times a day    Metformin Hcl 1000 Mg Tabs (Metformin hcl) .Marland Kitchen... 1 tablet by mouth two times a day  Her updated medication list for this problem includes:    Novolog Mix 70/30 70-30 % Susp (Insulin aspart prot & aspart) .Marland KitchenMarland KitchenMarland KitchenMarland Kitchen 50 units subcutaneous 30 minutes before 5pm meal and 50 units subcutaneous 30 minutes before 7am meal    Anacin 81 Mg Tbec (Aspirin) .Marland Kitchen... Take one tablet by mouth daily  Orders: [facility]- Est  Level 4 ([address])   Complete Medication List: 1)  Albuterol 90 Mcg/act Aers (Albuterol) .... Inhale 2 puff using inhaler every four hours 2)  Albuterol Sulfate (2.5 Mg/42ml) 0.083% Nebu (Albuterol sulfate) .... Per manufacturer instructions as needed wheezing 3)  Novolog Mix 70/30 70-30 % Susp (Insulin aspart prot & aspart) .... 50 units subcutaneous 30 minutes before 5pm meal and 50 units subcutaneous 30 minutes before 7am meal 4)  Advair Diskus  500-50 Mcg/dose Misc (Fluticasone-salmeterol) .Marland Kitchen.. 1 puff in the morning and 1 puff in the evening 5)  Fluoxetine Hcl 40 Mg Caps (Fluoxetine hcl) .... One tablet by mouth daily 6)  Simvastatin 40 Mg Tabs (Simvastatin) .... One tablet by mouth at bedtime 7)  Anacin 81 Mg Tbec (Aspirin) .... Take one tablet by mouth daily 8)  Promethazine Hcl 12.5 Mg Tabs (Promethazine hcl) .Marland Kitchen.. 1-2 tablets by mouth every 6-8 hours as needed for nausea 9)  Miralax Powd (Polyethylene glycol 3350) .Marland Kitchen.. 17g dissolved in 8oz of water, twice a day for three days. thereafter 17g daily in 8oz water.  disp one tub. 10)  Compression Stockings  .... Wear them for lower extremity swelling as needed 11)  Furosemide 20 Mg Tabs (Furosemide) .... One tablet by mouth daily as needed for lower extremity swelling 12)  Ultram 50 Mg Tabs (Tramadol hcl) .... One tablet by mouth every 6 hours as needed for breakthrough pain   Patient Instructions: 1)  Schedule a f/u appt with Dr. Raymondo Band in 2-4 wks and with me in 4 wks. 2)  F/u with the Cardiologist. 3)  I will call you with the lab results. 4)  If you have chest pain that doesn't improve after 30 minutes, go to the ER.   Prescriptions: ULTRAM 50 MG TABS (TRAMADOL HCL) one tablet by mouth every 6 hours as needed for breakthrough pain  #20 x 0   Entered and Authorized by:   [name]  MD   Signed by:   [name]  MD on 07/14/2008   Method used:   Electronically to        [facility]  [address] (retail)       [address]       [place], [place]  [address]       Ph: [phone]       Fax: [phone]   RxID:   [id number]  ]

## 2010-08-12 NOTE — Assessment & Plan Note (Signed)
Summary: Behavioral Medicine   History of Present Illness: [name] presents today with [name].  We continue to focus on the stressors she faces in the home and with her health.  Both Herbrt and [name] state that [name] has been off of his medicine the last two weeks and has actually been fairly good.  He and Shakeisha have "bonded" over the realization that she is his "mother" and that his biological mom will not be there for him in any meaningful way.  In addition to this, [name] shared several very positive things in her life including a new deck and ramp that was built by her brother-in-law.  [name] asked about her fluoxetine.  We discussed the purpose of it - to treat depression and anxiety.    Allergies: 1)  ! Penicillin 2)  ! * Singulair 3)  ! * Morphine 4)  Amoxicillin (Amoxicillin) 5)  Amoxicillin (Amoxicillin)   Impression & Recommendations:  Problem # 1:  ADJUSTMENT DISORDER (ICD-309.9) [name] describes her mood predominantly as "angry."  No signs of anhedonia.  She denies SI / HI.  No problems with sleep.  Appetite is good.  Energy is within normal limits.  Attention / concentration is wnl.  She is not meeting criteria for depression and I don't hear her reporting significant symptoms of anxiety.  Her biggest stress besides [name] and her head hurting is her disability claim.  She presented in the same way as times past with a new behavior of thumb sucking.  Each time she did this, [name] intervened and [name] stopped.  She also had trouble getting the word "narcotic" out.  This is unusual as she doesn't typically have trouble with words and has a good memory.  Differential continues to include somatoform vs. malingering.  She does have significant stress in her home situation without a lot of room for change.  Adjustment disorder, chronic in nature may be a better fit than depression.  If the patient does not report feeling any different on the fluoxetine in a month or so, I might  recommend discontinuing it to see how she does.  Perhaps she is not reporting depressive symtpoms currently because of the medication.  Dr. Burnadette Pop, who knows her for a longer period of time, might have a sense of this.  OF NOTE:  I called three different numbers for [facility] and was unable to find any service that might benefit [name] and his family.  They are adamant that they want to keep him at home rather than creating another situation where he is dumped off or passed on to another.   Follow-up in two weeks or as needed.   Orders: Therapy 40-50- min- [facility] (84696)  Complete Medication List: 1)  Albuterol 90 Mcg/act Aers (Albuterol) .... Inhale 2 puff using inhaler every four hours 2)  Albuterol Sulfate (2.5 Mg/34ml) 0.083% Nebu (Albuterol sulfate) .... Per manufacturer instructions as needed wheezing 3)  Novolog Mix 70/30 70-30 % Susp (Insulin aspart prot & aspart) .... 50 units subcutaneous 30 minutes before 5pm meal and 55 units subcutaneous 30 minutes before 7am meal 4)  Advair Diskus 500-50 Mcg/dose Misc (Fluticasone-salmeterol) .Marland Kitchen.. 1 puff in the morning and 1 puff in the evening 5)  Fluoxetine Hcl 40 Mg Caps (Fluoxetine hcl) .... 2 tablets by mouth daily 6)  Simvastatin 40 Mg Tabs (Simvastatin) .... One tablet by mouth at bedtime 7)  Anacin 81 Mg Tbec (Aspirin) .... Take one tablet by mouth daily 8)  Promethazine Hcl 12.5  Mg Tabs (Promethazine hcl) .Marland Kitchen.. 1-2 tablets by mouth every 6-8 hours as needed for nausea 9)  Miralax Powd (Polyethylene glycol 3350) .Marland Kitchen.. 17g dissolved in 8oz of water, twice a day for three days. thereafter 17g daily in 8oz water.  disp one tub. 10)  Compression Stockings  .... Wear them for lower extremity swelling as needed 11)  Furosemide 20 Mg Tabs (Furosemide) .... One tablet by mouth daily as needed for lower extremity swelling 12)  Ultram 50 Mg Tabs (Tramadol hcl) .... One tablet by mouth every 6 hours as needed for breakthrough  pain
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
## 2010-08-12 NOTE — Assessment & Plan Note (Signed)
Summary: routine office visit/ dpg   Vital Signs:  Patient Profile:   40 Years Old Female Height:     66.25 inches Weight:      233.9 pounds BMI:     37.60 Temp:     97.7 degrees F Pulse rate:   97 / minute BP sitting:   138 / 72  (left arm)  Pt. in pain?   no  Vitals Entered By: Starleen Blue RN (July 17, 2007 8:53 AM)                  PCP:  [name]  MD  Chief Complaint:  f/u for uncontrolled DM.  History of Present Illness: [place] is here for f/u after a change to Novolog 70/30, Glipizide, COPD meds, and new SSRI.  DM II- CBGs at home range from 150s-360s...improved from previous months.  No c/o headaches, vision changes, but still c/o fatigue.  Tolerating Glipizide and Novolog 70/30.  Last A1c in 11/08 was 9.5%.    COPD/Asthma- Pt currently on spiriva and advair with albuterol inh & nebs as needed.  She recently had resp infxn treated with 7 day course of prednisone and 5 day course of Azithromycin.  Her symptoms have improved but she still c/o of dyspnea sometimes preventing her from sleeping at night.  She desires home O2 for relief.  She admits to using her albuterol inh daily.  Obesity- She has gained 4lbs in less than 2 months.  She reports that she is trying to eat healthy with less fatty foods and walking daily.    Tobacco dependence- Still continuing to smoke 1 ppd.    Hypertriglyceridemia-  Pt found to have trig of >500 in 11/08.    Current Allergies: ! PENICILLIN ! * SINGULAIR      Physical Exam  General:     alert, well-hydrated, cooperative to examination, and overweight-appearing.   Lungs:     Normal respiratory effort, chest expands symmetrically. Lungs are clear to auscultation, no crackles or wheezes. Heart:     Normal rate and regular rhythm. S1 and S2 normal without gallop, murmur, click, rub or other extra sounds. Abdomen:     soft, non-tender, no distention, and no guarding.    Diabetes Management Exam:    Foot Exam (with  socks and/or shoes not present):       Sensory-Pinprick/Light touch:          Left medial foot (L-4): diminished          Left dorsal foot (L-5): diminished          Left lateral foot (S-1): diminished          Right medial foot (L-4): normal          Right dorsal foot (L-5): normal          Right lateral foot (S-1): normal       Sensory-Monofilament:          Left foot: absent          Right foot: normal       Inspection:          Left foot: normal          Right foot: normal       Nails:          Left foot: thickened          Right foot: thickened    Eye Exam:       Eye Exam not due  Impression & Recommendations:  Problem # 1:  DM W/NEURO MNFST, TYPE II, UNCONTROLLED (ICD-250.62) Mild improvement from previous months.  Will increase morning Novolog 70/30 to 25 units.  Pt not willing to try lantus so given her nl kidney fxn, (Cr 0.78 12/1), I'll add another oral med- Metformin to her regimen in addition to her Glipizide.  I have explained to her how exercise & smoking cessation will be beneficial to her diabetes.  She'll f/u in 4 months and we may need to inc. the Metformin or add a basal insulin at that time depending on her A1c and daily CBGs.   Her updated medication list for this problem includes:    Glipizide 10 Mg Tabs (Glipizide) .Marland Kitchen... Take one tablet by mouth two times a day    Novolog Mix 70/30 70-30 % Susp (Insulin aspart prot & aspart) .Marland KitchenMarland KitchenMarland KitchenMarland Kitchen 40 units subcutaneous 30 minutes before 5pm meal and 25 units subcutaneous 30 minutes before 7am meal    Metformin Hcl 500 Mg Tabs (Metformin hcl) .Marland Kitchen... Take one tablet with dinner once daily  Orders: [facility]- Est  Level 4 ([address])   Problem # 2:  COPD (ICD-496) Steady decline despite the appropriate medications.  I have explained to her numerous times how smoking is worsening her COPD.  Plan today is to inc her Advair dosage and continue the rest of her meds.  She is willing to try Chantix once again.  She is c/o dyspnea at night  so I will provide home O2 b/c this has been proven to help with mortality.   Her updated medication list for this problem includes:    Albuterol 90 Mcg/act Aers (Albuterol) ..... Inhale 2 puff using inhaler every four hours    Albuterol Sulfate (2.5 Mg/84ml) 0.083% Nebu (Albuterol sulfate) .Marland Kitchen... Per manufacturer instructions as needed wheezing    Spiriva Handihaler 18 Mcg Caps (Tiotropium bromide monohydrate) ..... One inhalation daily    Advair Diskus 500-50 Mcg/dose Misc (Fluticasone-salmeterol) .Marland Kitchen... 1 puff in the morning and 1 puff in the evening  Orders: Home Health Referral (Home Health) [facility]- Est  Level 4 ([address])   Problem # 3:  ASTHMA, UNSPECIFIED (ICD-493.90) Pt's previous asthma exacerbation last month secondary to resp infxn seems resolved.  Smoking cessation should improve the exacerbations.  No change in meds except inc in advair dosage.   Her updated medication list for this problem includes:    Albuterol 90 Mcg/act Aers (Albuterol) ..... Inhale 2 puff using inhaler every four hours    Albuterol Sulfate (2.5 Mg/13ml) 0.083% Nebu (Albuterol sulfate) .Marland Kitchen... Per manufacturer instructions as needed wheezing    Spiriva Handihaler 18 Mcg Caps (Tiotropium bromide monohydrate) ..... One inhalation daily    Advair Diskus 500-50 Mcg/dose Misc (Fluticasone-salmeterol) .Marland Kitchen... 1 puff in the morning and 1 puff in the evening  Orders: [facility]- Est  Level 4 ([address]11)   Problem # 4:  OBESITY NOS (ICD-278.00) Pt continues to gain wt.  Exercise and diet is the plan.  I have not discussed any diet meds or surgeries with her.  I want her to be empowered to do it on her own. Orders: [facility]- Est  Level 4 (91478)   Problem # 5:  TOBACCO DEPENDENCE (ICD-305.1) She is willing to try Chantix once again.  Her husband is willing to quit with her so I think this added motivation will truly help her to quit.   Her updated medication list for this problem includes:    Chantix Starting Month Pak 0.5 Mg X 11 &  1 Mg X 42 Misc (Varenicline tartrate) .Marland Kitchen... Per manufacturer instructions    Chantix Continuing Month Pak 1 Mg Tabs (Varenicline tartrate) .Marland Kitchen... Per manufacturer instructions  Orders: [facility]- Est  Level 4 ([address])   Problem # 6:  HYPERTRIGLYCERIDEMIA (ICD-272.1) Will start patient on fenofibrate high dose b/c of her risk of pancreatitis and cardiac problems given her extremely high trigs.  I will check her LFTs today and see her in 4 months to evaluate any MSK issues.  Her calculated LDL is 58 so she does not need a statin.   Her updated medication list for this problem includes:    Fenofibrate 160 Mg Tabs (Fenofibrate) .Marland Kitchen... Take one tablet daily with food  Orders: [facility]- Est  Level 4 ([address]454)   Problem # 7:  ANXIETY STATE, UNSPECIFIED (ICD-300.00) Doing well on Fluoxetine.   Her updated medication list for this problem includes:    Fluoxetine Hcl 10 Mg Caps (Fluoxetine hcl) ..... One tablet at 5pm daily  Orders: [facility]- Est  Level 4 ([address]11)   Complete Medication List: 1)  Albuterol 90 Mcg/act Aers (Albuterol) .... Inhale 2 puff using inhaler every four hours 2)  Glipizide 10 Mg Tabs (Glipizide) .... Take one tablet by mouth two times a day 3)  Albuterol Sulfate (2.5 Mg/54ml) 0.083% Nebu (Albuterol sulfate) .... Per manufacturer instructions as needed wheezing 4)  Novolog Mix 70/30 70-30 % Susp (Insulin aspart prot & aspart) .... 40 units subcutaneous 30 minutes before 5pm meal and 25 units subcutaneous 30 minutes before 7am meal 5)  Spiriva Handihaler 18 Mcg Caps (Tiotropium bromide monohydrate) .... One inhalation daily 6)  Advair Diskus 500-50 Mcg/dose Misc (Fluticasone-salmeterol) .Marland Kitchen.. 1 puff in the morning and 1 puff in the evening 7)  Fluoxetine Hcl 10 Mg Caps (Fluoxetine hcl) .... One tablet at 5pm daily 8)  Chantix Starting Month Pak 0.5 Mg X 11 & 1 Mg X 42 Misc (Varenicline tartrate) .... Per manufacturer instructions 9)  Chantix Continuing Month Pak 1 Mg Tabs (Varenicline  tartrate) .... Per manufacturer instructions 10)  Metformin Hcl 500 Mg Tabs (Metformin hcl) .... Take one tablet with dinner once daily 11)  Fenofibrate 160 Mg Tabs (Fenofibrate) .... Take one tablet daily with food  Other Orders: Comp Met-[facility] ([id number])   Patient Instructions: 1)  Please schedule a follow-up appointment in 4 months. 2)  Diabetes: You're doing a great job of logging your glucose.  Please continue to monitor your sugars the way you are.  We increased your morning novolog to 25 units and added Metformin. 3)  COPD/Asthma: We increased the dosage of your Advair today.  Please continue to use your spiriva daily and use your albuterol only with emergencies involving wheezing or shortness of breath.  I will order home oxygen for you to use as needed. 4)  Obesity:  Continue to eat the low fat diet.  Losing weight will be great for you.  Let's set a goal and try to obtain it for our next visit. 5)  Smoking:  I have confidence that you can quit.  Please stick to the Chantix that I have prescribed for you and I think you will notice a considerable difference in your breathing. 6)  High triglycerides:  We discussed today the possible detrimental effects of this condition.  I have started you on fenofibrate  to help lower your triglycerides.  We will check them again in a few months.  Let me know if you have any muscle pain.    Prescriptions: FENOFIBRATE  160 MG  TABS (FENOFIBRATE) take one tablet daily with food  #90 x 3   Entered and Authorized by:   [name]  MD   Signed by:   [name]  MD on 07/17/2007   Method used:   Electronically sent to ...       [facility] [address]       [address]       [place], [place]  [address]       Ph: [phone]       Fax: [phone]   RxID:   [id number] METFORMIN HCL 500 MG  TABS (METFORMIN HCL) take one tablet with dinner once daily  #90 x 3   Entered and Authorized by:    [name]  MD   Signed by:   [name]  MD on 07/17/2007   Method used:   Electronically sent to ...       [facility] [address]       [address]       [place], [place]  [address]       Ph: [phone]       Fax: [phone]   RxID:   [id number] CHANTIX CONTINUING MONTH PAK 1 MG  TABS (VARENICLINE TARTRATE) per manufacturer instructions  #1 x 2   Entered and Authorized by:   [name]  MD   Signed by:   [name]  MD on 07/17/2007   Method used:   Electronically sent to ...       [facility] [address]       [address]       [place], [place]  [address]       Ph: [phone]       Fax: [phone]   RxID:   [id number] CHANTIX STARTING MONTH PAK 0.5 MG X 11 & 1 MG X 42  MISC (VARENICLINE TARTRATE) per manufacturer instructions  #1 x 0   Entered and Authorized by:   [name]  MD   Signed by:   [name]  MD on 07/17/2007   Method used:   Electronically sent to ...       [facility] [address]       [address]       [place], [place]  [address]       Ph: [phone]       Fax: [phone]   RxID:   [id number] NOVOLOG MIX 70/30 70-30 %  SUSP (INSULIN ASPART PROT & ASPART) 40 units subcutaneous 30 minutes before 5pm meal and 25 units subcutaneous 30 minutes before 7am meal  #12ml x 6   Entered and Authorized by:   [name]  MD   Signed by:   [name]  MD on 07/17/2007   Method used:   Electronically sent to ...       [facility] [address]       [address]       [place], [place]  [address]       Ph: [phone]       Fax: [phone]   RxID:   [id number] ADVAIR DISKUS 500-50 MCG/DOSE  MISC (  FLUTICASONE-SALMETEROL) 1 puff in the morning and 1 puff in the evening  #1 x 2   Entered and Authorized by:   [name]  MD   Signed by:    [name]  MD on 07/17/2007   Method used:   Electronically sent to ...       [facility] [address]       [address]       [place], [place]  [address]       Ph: [phone]       Fax: [phone]   RxID:   [id number] FLUOXETINE HCL 10 MG  CAPS (FLUOXETINE HCL) one tablet at 5pm daily  #90 x 2   Entered and Authorized by:   [name]  MD   Signed by:   [name]  MD on 07/17/2007   Method used:   Electronically sent to ...       [facility] [address]       [address]       [place], [place]  [address]       Ph: [phone]       Fax: [phone]   RxID:   [id number] SPIRIVA HANDIHALER 18 MCG  CAPS (TIOTROPIUM BROMIDE MONOHYDRATE) one inhalation daily  #1 x 1   Entered and Authorized by:   [name]  MD   Signed by:   [name]  MD on 07/17/2007   Method used:   Electronically sent to ...       [facility] [address]       [address]       [place], [place]  [address]       Ph: [phone]       Fax: [phone]   RxID:   [id number] ALBUTEROL SULFATE (2.5 MG/3ML) 0.083%  NEBU (ALBUTEROL SULFATE) per manufacturer instructions as needed wheezing  #1 x 1   Entered and Authorized by:   [name]  MD   Signed by:   [name]  MD on 07/17/2007   Method used:   Electronically sent to ...       [facility] [address]       [address]       [place], [place]  [address]       Ph: [phone]       Fax: [phone]   RxID:   [id number] ALBUTEROL 90 MCG/ACT AERS (ALBUTEROL) Inhale 2 puff using inhaler every four hours  #1 x 1   Entered and Authorized by:   [name]  MD   Signed by:   [name]  MD on 07/17/2007   Method used:   Electronically sent to ...       [facility] [address]       [address]        [place], [place]  [address]       Ph: [phone]       Fax: [phone]   RxID:   [id number] GLIPIZIDE 10 MG  TABS (GLIPIZIDE) Take one tablet by mouth two times a day  #90 x 3   Entered and Authorized by:   Trisha Mangle  Braelyn Bordonaro  MD   Signed by:   [name]  MD on 07/17/2007   Method used:   Electronically sent to ...       [facility] [address]       [address]       [place], [place]  [address]       Ph: [phone]       Fax: [phone]   RxID:   [id number]  ]  Vital Signs:  Patient Profile:   40 Years Old Female Height:     66.25 inches Weight:      233.9 pounds BMI:     37.60 Temp:     97.7 degrees F Pulse rate:   97 / minute BP sitting:   138 / 72

## 2010-08-12 NOTE — Progress Notes (Signed)
  Phone Note Call from Patient   Caller: Patient Summary of Call: diarrhea and vomiting all week long.  Dramamine helped the vomiting.  Diarrhea continuing.  Starting to feel weak.  Urine darkening.  Going on for one week.  No travel/camping.  No blood.  Clear fluid.  Recommended to continue pushing fluids.  Can try Immodium AD.  If weak and thinks she needs IVF, consider Urgent care vs ED.  If continuing tomorrow morning, consider calling for w/in appt at [facility]. Initial call taken by: Angeline Slim MD,  August 26, 2007 10:31 AM

## 2010-08-12 NOTE — Progress Notes (Signed)
Summary: triage   Phone Note Call from Patient Call back at Home Phone [phone]   Caller: Patient Summary of Call: needs to talk to nurse about diarrhea since last Monday. Initial call taken by: De Nurse,  February 09, 2009 9:31 AM  Follow-up for Phone Call        relates diarrhea all week. disturbing sleep. appt at 11am with Dr,. Ta Follow-up by: Golden Circle RN,  February 09, 2009 9:47 AM  Additional Follow-up for Phone Call Additional follow up Details #1::        Pt seen by Dr. Janalyn Harder. Agree w/ plan. f/u as needed  Additional Follow-up by: [name]  MD,  February 11, 2009 2:08 PM

## 2010-08-12 NOTE — Progress Notes (Signed)
Summary: triage   Phone Note Call from Patient Call back at Home Phone [phone]   Caller: Patient Summary of Call: has a boil under arm and in private area- wants antibiotic for it Initial call taken by: De Nurse,  February 25, 2009 11:46 AM  Follow-up for Phone Call        appt made for 3pm in work in. she is aware there may be a wait Follow-up by: Golden Circle RN,  February 25, 2009 11:47 AM  Additional Follow-up for Phone Call Additional follow up Details #1::        Plan to see pt as work in today Additional Follow-up by: [name]  MD,  February 25, 2009 3:37 PM

## 2010-08-12 NOTE — Miscellaneous (Signed)
Summary: Wheelchair Assessment  Spoke with patient in the clinic and told them that I could not complete a wheelchair assessment adequately therefore, I will ask who is qualified to assess the degree of her mobility.  I will contact Dr. Darrick Penna and ask him if the sports medicine clinic is doing these evaluations............Marland KitchenMarisue Ivan, MD    [employer] dropped off form to be completed for pt to have a wheelchair assessment, pt has an appt Thursday 01/29/09, placed in triage door for any clinical info to be completed.   Knox Royalty  January 28, 2009 2:58 PM   form to pcp.Golden Circle RN  January 28, 2009 3:07 PM

## 2010-08-12 NOTE — Letter (Signed)
Summary: [facility]  Weight Management plan of care.  Hardcopy in MD box.

## 2010-08-12 NOTE — Progress Notes (Signed)
Summary: meds probems  Phone Note Call from Patient Call back at Home Phone [phone]   Caller: Patient Summary of Call: midodrine 5mg  - Karn Pickler - started taking Wed and is making her chest hurt worse and giving her migrains. Initial call taken by: De Nurse,  August 22, 2008 9:39 AM  Follow-up for Phone Call        MD paged . Follow-up by: Theresia Lo RN,  August 22, 2008 9:48 AM  Additional Follow-up for Phone Call Additional follow up Details #1::        Dr.Triche advises for patient to stop midodrine. next step will be to see Neurologist. referral has been sent. patient notified. Additional Follow-up by: Theresia Lo RN,  August 22, 2008 10:35 AM

## 2010-08-12 NOTE — Progress Notes (Signed)
   Phone Note Call from Patient   Caller: Patient Call For: [phone] Summary of Call: Pt need to get an order to use a Rolator walker to use when walking in case she gets tired during her walk.  It has a seat attached to it.  The company that has it will be faxing a form for you to complete and fax back.  The name of company is [employer]. Initial call taken by: Abundio Miu,  June 10, 2010 10:35 AM  Follow-up for Phone Call        I will look for the application on Monday when I am in the office. Thanks.  Follow-up by: Jamie Brookes MD,  June 13, 2010 9:10 PM     Appended Document:  signed and will put in fax file today.

## 2010-08-12 NOTE — Progress Notes (Signed)
Summary: Problem with rx  Phone Note Call from Patient Call back at Home Phone [phone]   Reason for Call: Talk to Nurse Summary of Call: pt sts she ran out of her novolog yesterday and the pharmacy told her she is not due to have it filled until 12/12, pt would like to know what she should do? [facility]  Initial call taken by: ERIN LEVAN,  June 19, 2007 11:53 AM  Follow-up for Phone Call        Spoke with pharmacist at [facility] in [name].  She states pt's rx is being denied because the rx was written for 1 vial which for this pt's dosage is approx a 15 day supply.  They are stating it is too early because according to the insurance co. the rx is supposed to be for one month.  I authorized pharmacy to change rx to 2 vials which will last for one month with no refills, so that pt may get her insulin today.  Pharmacy will send rx refill request for additional refills of the novolog 2 vials per month to Dr. Burnadette Pop to approve.  Follow-up by: AMY MARTIN RN,  June 19, 2007 3:56 PM  Additional Follow-up for Phone Call Additional follow up Details #1::        I attempted to call the patient to inquire about her situation.  I left a message on her voicemail that if the Novolog is controlling her blood sugars then she should continue with the medicine.  I explained that I would go ahead and call in a refill to her pharmacy. Additional Follow-up by: [name]  MD,  June 26, 2007 4:14 PM      Prescriptions: NOVOLOG MIX 70/30 70-30 %  SUSP (INSULIN ASPART PROT & ASPART) 40 units subcutaneous 30 minutes before 5pm meal and 20 units subcutaneous 30 minutes before 7am meal  #10 ml x 1   Entered by:   [name]  MD   Authorized by:   Marland Kitchen Triage [facility]   Signed by:   [name]  MD on 06/26/2007   Method used:   Electronically sent to ...       [facility]  [address]       [address]       [place], [place]  [address]       Ph: [phone]       Fax: [phone]   RxID:   [id number]

## 2010-08-12 NOTE — Progress Notes (Signed)
Summary: Theme park manager Note Other Incoming   Call placed by: [employer] of Call: Is unable to help pt in services because they do not accept [employer].  She needs to be referred through [facility] [facility]. Initial call taken by: Haydee Salter,  September 12, 2007 10:42 AM  Follow-up for Phone Call        I spoke with the patient today in clinic.  She may not need the amount of oxygen that she claims.  We performed an ambulation test while in the clinic and she remained above 95% without O2. Follow-up by: [name]  MD,  September 13, 2007 3:35 PM

## 2010-08-12 NOTE — Assessment & Plan Note (Signed)
Summary: LOWER ABD PAIN , VOMITING,DIARRHEA/LS   Vital Signs:  Patient Profile:   40 Years Old Female Weight:      218.3 pounds Temp:     98.2 degrees F Pulse rate:   80 / minute BP sitting:   119 / 63  (left arm)  Pt. in pain?   yes    Location:   abdomen    Intensity:   6  Vitals Entered By: Theresia Lo RN (October 18, 2006 2:44 PM)              Is Patient Diabetic? Yes    Chief Complaint:  lower abd pain  and nausea and vomiting.  History of Present Illness: 40 y/o WF presents with:  1) Abdominal pain x 1-2 days- This feels on the surface.  Is located in the bilateral lower quadrants.  Feels on the surface.  8/10 per patient, but it is not interfering with sleep or ambulation.  No radiation.  No back pain.  No vaginal D/C.  No dysuria.  + frequency with her history of DM.  No fever.  2) Diarrhea:  x 1 week:  Sons sick last week.  Son's school sent home a letter about a "stomach bug" that had been going around.  Black stools, but only after taking pepto bismol.  No hematochezia noted.  3-4 stools per day.  No travel.  No abx.  3) Vomiting:  x 2 days:  Originally productive, but now only dry heaves.  Is able to drink fluids well.  urinating appropriately.  No blood noted.  Dry heaved 3-4 times this AM.     Social History:    Reviewed history from 09/07/2006 and no changes required:       lives with boyfriend and sons, unemployed due to asthma; smokes 1/2ppd   Risk Factors:  Tobacco use:  current   Review of Systems       See HPI   Physical Exam  General:     Well-developed,well-nourished,in no acute distress; alert,appropriate and cooperative throughout examination Mouth:     mucus membranes moist. Neck:     No deformities, masses, or tenderness noted. Lungs:     Normal respiratory effort, chest expands symmetrically. Lungs are clear to auscultation, no crackles or wheezes. Heart:     Normal rate and regular rhythm. S1 and S2 normal without gallop,  murmur, click, rub or other extra sounds. Abdomen:     Bowel sounds positive,abdomen soft and  without masses, organomegaly or hernias noted.  Mild tenderness to palpation.  Negative psoas and obturator and murphy's Rectal:     No external abnormalities noted. Normal sphincter tone. No rectal masses or tenderness.  Heme negative. Skin:     Intact without suspicious lesions or rashes  Normal turgor    Impression & Recommendations:  Problem # 1:  ENTERITIS, VIRAL NOS (ICD-008.8) Assessment: New Keep hydrated.  Will culture to r/o bacterial/Giardia.  Suggested Imodium over Pepto.  See instructions for red flags. Orders: [facility]- Est Level  3 ([address])  Future Orders: Culture, Stool- [facility] [id number]) ... 10/17/2007 Stool, WBC/Lactoferrin-[facility] ([id number]) ... 10/17/2007 Culture, Stool Giardia/Cryptosporidium-[facility] [id number]) ... 10/17/2007 Stool Culture Identification- [facility] ([id number]) ... 10/17/2007    Patient Instructions: 1)  Return for fever, worsening symptoms, pain such that you can't sleep, dehydation (unable to drink), bloody stools, bloody vomit. 2)  Stop smoking. 3)  Try Imodium AD. 4)  Script for phenergan 25mg .  Take 1/2 to one tab every 6 hours as  needed.

## 2010-08-12 NOTE — Progress Notes (Signed)
Summary: Novolog 70/30 refill- 42month supply, 1 refill   Phone Note Refill Request Call back at Home Phone [phone] Message from:  Patient  Refills Requested: Medication #1:  NOVOLOG MIX 70/30 70-30 %  SUSP 50 units subcutaneous 30 minutes before 5pm meal and 55 units subcutaneous 30 minutes before 7am meal   Supply Requested: 1 month   Notes: needs 3 to last 1 month Initial call taken by: De Nurse,  July 07, 2009 12:14 PM  Follow-up for Phone Call        to pcp Follow-up by: Golden Circle RN,  July 07, 2009 12:15 PM    Prescriptions: NOVOLOG MIX 70/30 70-30 %  SUSP (INSULIN ASPART PROT & ASPART) 50 units subcutaneous 30 minutes before 5pm meal and 55 units subcutaneous 30 minutes before 7am meal  #3 x 1   Entered and Authorized by:   [name]  MD   Signed by:   [name]  MD on 07/07/2009   Method used:   [facility]ically to        [facility] [address])       [address]       [place], [place]  [address]       Ph: [phone]       Fax: [phone]   RxID:   [id number]

## 2010-08-12 NOTE — Assessment & Plan Note (Signed)
Summary: HIDRADENITIS SUPPURATIVA   Vital Signs:  Patient profile:   40 year old female Weight:      230.6 pounds BMI:     37.07 Temp:     98.5 degrees F oral Pulse rate:   101 / minute BP sitting:   114 / 72  (left arm)  Vitals Entered By: Alphia Kava (February 25, 2009 3:22 PM) CC: boils on axilla,groin Is Patient Diabetic? No   Primary Care Provider:  [name]  MD  CC:  boils on axilla and groin.  History of Present Illness: 40yo F w/ new lesions underneath L axilla and groin  Skin lesion: Localized to L axilla and groin region.  x  few days.  Has applied "raw potato" to it.  States that it has been draining and is extrememly tender to palpation.  No fevers or chills.  Using "Sure" deodorant.    Psych: States she had seen Dr. Donell Beers last friday and states he needed more information.    Habits & Providers  Alcohol-Tobacco-Diet     Tobacco Status: current     Cigarette Packs/Day: 0.5  Allergies: 1)  ! Penicillin 2)  ! * Singulair 3)  ! * Morphine 4)  Amoxicillin (Amoxicillin) 5)  Amoxicillin (Amoxicillin)  Review of Systems      See HPI  Physical Exam  General:  VS reviewed.  Non ill appearing, at her baseline state, NAD Skin:  2 isolated erythematous raised lesions with an apex that has active drainage...minimally expressed at this time...measures  ~1.5cm in diameter at the base  tender to palpation  lymph node is non tender   Impression & Recommendations:  Problem # 1:  HIDRADENITIS SUPPURATIVA (ICD-705.83) Assessment New  Actively draining and no signs of systemic infection.  Will treat localized infection w/ doxy.  Educated about prevention...switching to anti-perspirant w/o aluminum as the ingredient.  Staying dry.  Losing weight and adquate control of blood sugar.  She is to f/u if no improvement.  Orders: [facility]- Est Level  3 ([id number])  Problem # 2:  ADJUSTMENT DISORDER (ICD-309.9) Assessment: Comment Only Will contact Dr.  Donell Beers.  Complete Medication List: 1)  Albuterol 90 Mcg/act Aers (Albuterol) .... Inhale 2 puff using inhaler every four hours w/ spacer please disp spacer as well 2)  Novolog Mix 70/30 70-30 % Susp (Insulin aspart prot & aspart) .... 50 units subcutaneous 30 minutes before 5pm meal and 55 units subcutaneous 30 minutes before 7am meal 3)  Advair Diskus 500-50 Mcg/dose Misc (Fluticasone-salmeterol) .Marland Kitchen.. 1 puff in the morning and 1 puff in the evening 4)  Fluoxetine Hcl 40 Mg Caps (Fluoxetine hcl) .... 2 tablets by mouth daily 5)  Simvastatin 40 Mg Tabs (Simvastatin) .... One tablet by mouth at bedtime 6)  Anacin 81 Mg Tbec (Aspirin) .... Take one tablet by mouth daily 7)  Miralax Powd (Polyethylene glycol 3350) .Marland Kitchen.. 17g dissolved in 8oz of water, twice a day for three days. thereafter 17g daily in 8oz water.  disp one tub. 8)  Compression Stockings  .... Wear them for lower extremity swelling as needed 9)  Prilosec 20 Mg Cpdr (Omeprazole) .... Take one tablet each morning 10)  Doxycycline Hyclate 100 Mg Tabs (Doxycycline hyclate) .... One tablet by mouth two times a day x 14 days  Other Orders: A1C-[facility] ([id number])  Patient Instructions: 1)  Please schedule a follow-up appointment in 2 months to reassess diabetes and everyday function. 2)  Switch to an antiperspirant that does NOT contain Aluminum. 3)  Weight loss and control of blood sugar are very important to prevent the reoccurence.   4)  It is also important to keep the area dry. 5)  If this does not start to improve after a week, please call and come back to be reassessed. Prescriptions: ALBUTEROL 90 MCG/ACT AERS (ALBUTEROL) Inhale 2 puff using inhaler every four hours w/ spacer Please disp spacer as well  #1 x 2   Entered and Authorized by:   [name]  MD   Signed by:   [name]  MD on 02/25/2009   Method used:   [facility] to        [facility] [address]       [address]        [place], [place]  [address]       Ph: [phone]       Fax: [phone]   RxID:   [id number] DOXYCYCLINE HYCLATE 100 MG TABS (DOXYCYCLINE HYCLATE) one tablet by mouth two times a day x 14 days  #28 x 0   Entered and Authorized by:   [name]  MD   Signed by:   [name]  MD on 02/25/2009   Method used:   [facility]ronically to        [facility] [address]       [address]       [place], [place]  [address]       Ph: [phone]       Fax: [phone]   RxID:   [id number]   Laboratory Results   Blood Tests   Date/Time Received: February 25, 2009 3:27 PM  Date/Time Reported: February 25, 2009 4:11 PM   HGBA1C: 12.2%   (Normal Range: Non-Diabetic - 3-6%   Control Diabetic - 6-8%)  Comments: ...............test performed by......Marland KitchenBonnie A. [place], [place] (ASCP)     Appended Document: HIDRADENITIS SUPPURATIVA Attempted to call Dr. Donell Beers at [phone] but no answer.  Will need to contact him before seeing Hermila again to discuss plan of care and assessment.

## 2010-08-12 NOTE — Progress Notes (Signed)
Summary: [place] request  Phone Note Call from Patient Call back at Home Phone [phone]   Reason for Call: Talk to Nurse Summary of Call: pt needs to be seen for her ovaries bothering her Initial call taken by: Haydee Salter,  October 18, 2006 11:16 AM  Follow-up for Phone Call        PT REPORTS MID LOWER ABD PAIN X 2 DAYS. HAS HAD DIARRHEA X 1 WEEK .VOMITING  X2 DAYS. YESTERDAY 3 TIMES ,TODAY 4-5 TIMES BETWEEN 3:30 AM AND 5:00 AM.  NONE SINCE THAT TIME . HAS BEEN DRINKING WELL. NO FEVER. APPOINTMENT SCHEDULED ON NURSE LIST THIS AFTERNOON. Follow-up by: Theresia Lo RN,  October 18, 2006 11:48 AM

## 2010-08-12 NOTE — Assessment & Plan Note (Signed)
Summary: fu/kh   Vital Signs:  Patient Profile:   40 Years Old Female Weight:      222 pounds Temp:     98.1 degrees F Pulse rate:   94 / minute BP sitting:   139 / 81  (left arm)  Pt. in pain?   yes    Location:   left leg    Intensity:   8    Type:       sharp  Vitals Entered ByJacki Cones RN (March 22, 2007 4:16 PM)                  PCP:  [name]  MD  Chief Complaint:  f/u left leg and increased blood sugar.  History of Present Illness: [name] is 40yo WF h/o uncontrolled DM II here for f/u after aggressive increases in her lantus.  DM II: [name] has been increasing her lantus by 4 units each day for the past week with goals of FBG 90-120.  She is up to 70 units once a day and her FBG range from 180 to 300s.  She reports that she continues to have headaches, dizziness, fatigue, polyuria, polydipsia, inc. wt. gain, and new onset of diarrhea.  She reports that she has not been able to walk as much this past week b/c of left leg pain.  Tobacco: She continues to smoke 1ppd and doesn't desire to quit.  Current Allergies: No known allergies   Past Medical History:    Reviewed history from 03/13/2007 and no changes required:       1.  Diabetes mellitus, type II       2.  h/o DVT       3.  Asthma       4.  Tobacco abuse       5.  COPD  Past Surgical History:    Reviewed history from 09/07/2006 and no changes required:       Lipid 06/14/2006  TC=188, LDL=93, HDL=35, TG=301 - 06/14/2006   Social History:    Reviewed history from 03/13/2007 and no changes required:       lives with husband  and sons, works as Social worker as Conservation officer, [employer]; smokes 1 ppd     Physical Exam  General:     alert, appropriate dress, cooperative to examination, and overweight-appearing.   Additional Exam:     Microfilament test:  Positive neuropathy in the left foot; neg in the right foot Foot exam: No ulcers or lesions noted; major caluses    Impression &  Recommendations:  Problem # 1:  DM W/NEURO MNFST, TYPE II, UNCONTROLLED (ICD-250.62) She continues to have uncontrolled blood sugars despite the aggressive increase in lantus.  All of her symptoms including the new onset of diarrhea seems to be attributed to the high blood sugars.  She understands the reason behind her symptoms.  I believe that she is compliant with her lantus.  I think the problem may be in the amount of activity.  We discussed the importance of cardiovascular activity such as walking daily but her leg pain prevents her from doing so.  We discussed alternative exercises that are less impact such as biking, swimming, and wt training.   Today we discussed changing her lantus regimen into twice a day, 35 units each injection.  We also added Glipizide ideally 30 mintues before meals for maximum absorption and greatest reduction in postprandial hyperglycemia.  My worry of this drug is that it could cause more wt  gain.  The other alternatives did not seem as good b/c of the adverse GI effects experienced in the past with Metformin and the cost and side effects of the TZDs.   We will reassess in 1 month and recheck her Hgb A1c.   Her updated medication list for this problem includes:    Glipizide 5 Mg Tabs (Glipizide) ..... One tablet by mouth twice a day  Orders: [facility]- Est Level  3 ([address])   Problem # 2:  TOBACCO DEPENDENCE (ICD-305.1) Assessment: Unchanged Brief smoking cessation education was conducted.  She was given written information of the possible options.  She does not appear to truly want to quit but remains contemplative of the idea.  She reports that she will try the patch since her insurance won't cover Chantix.   Orders: [facility]- Est Level  3 ([id number])   Complete Medication List: 1)  Albuterol 90 Mcg/act Aers (Albuterol) .... Inhale 2 puff using inhaler every four hours 2)  Advair Diskus 500-50 Mcg/dose Misc (Fluticasone-salmeterol) .Marland Kitchen.. 1 puff as directed twice a day 3)   Glipizide 5 Mg Tabs (Glipizide) .... One tablet by mouth twice a day 4)  Albuterol Sulfate (2.5 Mg/51ml) 0.083% Nebu (Albuterol sulfate) .... Per manufacturer instructions  Other Orders: Future Orders: A1C-[facility] ([id number]) ... 03/18/2008   Patient Instructions: 1)  Please schedule a follow-up appointment in 1 month. 2)  35 units lantus once in the morning and once in the evening. 3)  Glipizide 5mg  by mouth twice a day 4)  Exercise:  Attempt to walk for 10 minutes each day but if pain persist walk every other day 5)  Smoking cessation:  Try OTC patch;  6)  We will check your Hgb A1c 7)  Go to diabetic teaching on 03/26/07    Prescriptions: ALBUTEROL SULFATE (2.5 MG/3ML) 0.083%  NEBU (ALBUTEROL SULFATE) per manufacturer instructions  #31 x 0   Entered and Authorized by:   [name]  MD   Signed by:   [name]  MD on 03/22/2007   Method used:   Print then Give to Patient   RxID:   [id number] GLIPIZIDE 5 MG  TABS (GLIPIZIDE) one tablet by mouth twice a day  #62 x 0   Entered and Authorized by:   [name]  MD   Signed by:   [name]  MD on 03/22/2007   Method used:   Print then Give to Patient   RxID:   [id number]  ]

## 2010-08-12 NOTE — Assessment & Plan Note (Signed)
Summary: Hospital admission   Vital Signs:  Patient Profile:   40 Years Old Female Height:     66.25 inches O2 Sat:      95 % O2 treatment:    Room Air Temp:     98.7 degrees F Pulse rate:   107 / minute Resp:     16 per minute BP sitting:   103 / 66                 PCP:  [name]  MD  Chief Complaint:  hospital admission.  History of Present Illness: Pt is a 40YO female with hx of DM2, COPD, asthma, DVT,endometriosis who presents to ED with 6-day hx of right-sided abdominal pain, nausea and vomitting.  Pain began suddenly on Thursday (6 days ago).  It is described as right-sided extending from umbilicus to right flank.  It is in the upper and lower quadrants.  It is constant, but its severity fluctuates from mild (5/10) to severe (10/10).  The pain was accompanied by nausea and bilious vomitting (no diarrhea).  When the pain did not remit by Sunday, she came to the ER.  In the ER, an abdominal u/s showed fatty liver and a CBD at the upper limits of normal, but otherwise negative, and her LFTs were normal.  She was sent home and told to follow up with GI.  [facility] did not have appt available until June; so pt came to [facility] where she saw Dr. Mannie Stabile.  Dr. Mannie Stabile felt that the pain was likely radicular pain vs zoster.  He prescribed mobic, flexeril, and miralax (as the pt endorsed constipation).  She did not improve; so she came back to [facility] today and saw Dr. Mannie Stabile who changed pain meds to vicodin and ordered T-spine and L-spine films which showed ant paravertebral spurring.   The pt reports that she has continued to have n/v.  States that she can't keep solids down, but can keep some liquids down.    ROS + for blood in stool 1 month ago, constipation X 1 week, pruritic vaginal rash, white vaginal discharge, HAs past few days, dry mouth, elevated CBGs in 300s-400s, wheezing, decreased sensation in left leg (has this at baseline presumably due to DM neuropathy) negative for CP, SOB, dyuria,  upper airway sxs, changes in menstrual period   labs/studies:  Na=130 (corrected 133), potassium 3.6, Cl 92, bicarb 29, BUN 10, Cr 1.07 (GFR 57), glucose 312, tbili 0.5, tprot 8.0, alb 2.7, AST 13, ALT 17, lipase 18, Ca 9.4  WBC 14.5, Hgb 12.4, plts 399;  diff:  ANC 11.3, 78% neutrophils  u/a:  sg 1.046, gluc >1000, 15 ketones, 100 protein, LE neg, nitrite neg; micro:  many epithelial cells, 7-10 WBCs, many bacteria, other amorphous urates  lipase 18  u/s from 5/10:  CBD 5 mm (upper limits of normal); fatty liver  T-spine:  anterior paravertebral spurring without fracture or subluxation L-spine:P  no acute bony findings  CT abd:  right pyelo in mid-lower pole with overlying perinephric fluid or developing abscess.     Current Allergies: ! PENICILLIN ! Sharene Butters  Past Medical History:    Reviewed history from 03/13/2007 and no changes required:       1.  Diabetes mellitus, type II       2.  h/o DVT       3.  Asthma       4.  Tobacco abuse       5.  COPD       6.  suspected uti 4/09--of note urine cx in Echart from 11/16/07 showed >100,000 colonies of mixed species; recommended recollection as indicated; no sensitivities  Past Surgical History:    Reviewed history from 09/07/2006 and no changes required:       Lipid 06/14/2006  TC=188, LDL=93, HDL=35, TG=301 - 06/14/2006   Family History:    DM, CAD, DVTs  Social History:    lives with husband  and 2 sons ; smokes 3 cigs/day; currently unable to work due to limited ability to walk (neuropathy); trying to get disability     Physical Exam  General:     Well-developed,well-nourished,uncomfortable, but in no acute distress; alert,appropriate and cooperative throughout examination Head:     Normocephalic and atraumatic without obvious abnormalities. No apparent alopecia or balding. Lungs:     Normal respiratory effort, chest expands symmetrically. Expiratory wheezes throughout lung fields Heart:     Normal rate and  regular rhythm. S1 and S2 normal without gallop, murmur, click, rub or other extra sounds. Abdomen:     soft.  very tender to palpation on right upper and lower quadrants; less tender at right flank; voluntary guarding; no rebound tenderness; no murphy's sign; no organomegaly or ascites appreciated; normal bowel sounds Genitalia:     labial erythema; otherwise normal Psych:     Cognition and judgment appear intact. Alert and cooperative with normal attention span and concentration. No apparent delusions, illusions, hallucinations    Impression & Recommendations:  Problem # 1:  PYELONEPHRITIS (ICD-590.80) Will get blood cxs X 2 and cath urine culture.  Then will start zosyn per pharmacy.  Dilaudid for pain.  Hopefully, can decrease pain meds when abx start to take effect.  CT read as perinephric fluid vs developing abscess.  So unlikely anything to drain, but will need to consider this if she does not improve on antibiotics.  Will give NS bolus then IVF at 150cc/hr.  Zofran for nausea. The following medications were removed from the medication list:    Smz-tmp Ds 800-160 Mg Tabs (Sulfamethoxazole-trimethoprim) .Marland Kitchen... Take one tablet twice a day for 5 days   Problem # 2:  COPD (ICD-496) Expiratory wheezes on exam.  Ordered q 4 scheduled and q 2hour albuterol to be spaced by RT.  CXR in am.  Consider spiriva. Her updated medication list for this problem includes:    Albuterol 90 Mcg/act Aers (Albuterol) ..... Inhale 2 puff using inhaler every four hours    Albuterol Sulfate (2.5 Mg/86ml) 0.083% Nebu (Albuterol sulfate) .Marland Kitchen... Per manufacturer instructions as needed wheezing    Advair Diskus 500-50 Mcg/dose Misc (Fluticasone-salmeterol) .Marland Kitchen... 1 puff in the morning and 1 puff in the evening   Problem # 3:  DM W/NEURO MNFST, TYPE II, UNCONTROLLED (ICD-250.62) Pt's last A1C 11.1 in February.  CBGs in 300s-400s past week.  Pt insists that she is taking her meds.  Spoke with pharmacy for input.  Will  start her on lantus 25 units two times a day and resistant SSI with HS coverage.  Check CBGs q 4hours.  A1C with am labs.  Cont ASA.  Will hold metformin for now since Cr is slightly elevated (baseline 0.7).   Her updated medication list for this problem includes:    Glipizide 10 Mg Tabs (Glipizide) .Marland Kitchen... Take one tablet by mouth two times a day    Novolog Mix 70/30 70-30 % Susp (Insulin aspart prot & aspart) .Marland KitchenMarland KitchenMarland KitchenMarland Kitchen 40 units subcutaneous 30 minutes before 5pm  meal and 30 units subcutaneous 30 minutes before 7am meal    Metformin Hcl 500 Mg Tabs (Metformin hcl) .Marland Kitchen... Take two tablets with dinner once daily    Anacin 81 Mg Tbec (Aspirin) .Marland Kitchen... Take one tablet by mouth daily   Problem # 4:  HYPERTRIGLYCERIDEMIA (ICD-272.1) continue simva Her updated medication list for this problem includes:    Simvastatin 40 Mg Tabs (Simvastatin) ..... One tablet by mouth at bedtime   Problem # 5:  TOBACCO DEPENDENCE (ICD-305.1) currently smoking 3 cigs/day.  counseled to quit smoking  Problem # 6:  HYPONATREMIA (ICD-276.1) corrected na is 133; so not really that low.  most likely due to dehydration.  Will check fena if it remains low after IVF  Problem # 7:  ANXIETY STATE, UNSPECIFIED (ICD-300.00) continue prozac Her updated medication list for this problem includes:    Fluoxetine Hcl 10 Mg Caps (Fluoxetine hcl) ..... One tablet at 5pm daily   Complete Medication List: 1)  Albuterol 90 Mcg/act Aers (Albuterol) .... Inhale 2 puff using inhaler every four hours 2)  Glipizide 10 Mg Tabs (Glipizide) .... Take one tablet by mouth two times a day 3)  Albuterol Sulfate (2.5 Mg/16ml) 0.083% Nebu (Albuterol sulfate) .... Per manufacturer instructions as needed wheezing 4)  Novolog Mix 70/30 70-30 % Susp (Insulin aspart prot & aspart) .... 40 units subcutaneous 30 minutes before 5pm meal and 30 units subcutaneous 30 minutes before 7am meal 5)  Advair Diskus 500-50 Mcg/dose Misc (Fluticasone-salmeterol) .Marland Kitchen.. 1 puff  in the morning and 1 puff in the evening 6)  Fluoxetine Hcl 10 Mg Caps (Fluoxetine hcl) .... One tablet at 5pm daily 7)  Metformin Hcl 500 Mg Tabs (Metformin hcl) .... Take two tablets with dinner once daily 8)  Simvastatin 40 Mg Tabs (Simvastatin) .... One tablet by mouth at bedtime 9)  Anacin 81 Mg Tbec (Aspirin) .... Take one tablet by mouth daily 10)  Meloxicam 15 Mg Tabs (Meloxicam) .Marland Kitchen.. 1 tablet by mouth daily with food 11)  Promethazine Hcl 12.5 Mg Tabs (Promethazine hcl) .Marland Kitchen.. 1-2 tablets by mouth every 6-8 hours as needed for nausea 12)  Fluconazole 150 Mg Tabs (Fluconazole) .... One tablet by mouth once 13)  Miralax Powd (Polyethylene glycol 3350) .Marland Kitchen.. 17g dissolved in 8oz of water, twice a day for three days. thereafter 17g daily in 8oz water.  disp one tub. 14)  Vicodin 5-500 Mg Tabs (Hydrocodone-acetaminophen) .... One tablet every six hours for pain.    ]

## 2010-08-12 NOTE — Progress Notes (Signed)
Summary: verify order  Phone Note From Other Clinic Call back at [phone]   Caller: Carol/aprea Summary of Call: needs to verify order for oxygen Initial call taken by: ERIN LEVAN,  August 02, 2007 4:22 PM  Follow-up for Phone Call        called and faxed order to [phone] attn. carol. Follow-up by: Arlyss Repress CMA,,  August 02, 2007 4:56 PM

## 2010-08-12 NOTE — Progress Notes (Signed)
Summary: change medication  Phone Note Call from Patient Call back at Home Phone [phone]   Reason for Call: Talk to Nurse Summary of Call: pt is requesting to speak with rn, she sts the glipizide she is taking is causing her to gain a lot of weight and she wants to know what she can do? Initial call taken by: ERIN LEVAN,  May 15, 2007 12:05 PM  Follow-up for Phone Call        has been on glipizide for about 3 months. c/o large weight gain. has been out off them x 3 days and does not want to renew them. Take 60 units of insulin daily in am. AM fasting cgbs are running in the 400s. states they were in the 200s when she was taking the glipizide. PCP not available until next friday. appt made for 8:15am Wednesday to evaluate meds. urged her to keep a food diary to help pin point problem areas  Follow-up by: Golden Circle RN,  May 15, 2007 12:17 PM

## 2010-08-12 NOTE — Assessment & Plan Note (Signed)
Summary: F/U VISIT/[facility]   Vital Signs:  Patient Profile:   40 Years Old Female Height:     66.25 inches Weight:      230 pounds Pulse rate:   92 / minute BP sitting:   138 / 82  Vitals Entered By: Lillia Pauls CMA (May 30, 2007 2:36 PM)                 PCP:  [name]  MD  Chief Complaint:  Elevated blood sugars.  History of Present Illness: [name] is a 40yo h/o uncontrolled DM II that was seen in the clinic 2 wks ago c/o elevated CBGs in the 400s after discontinuing her glipizide due to weight gain and was subsequently switched to Januvia.  She reports that blood sugars are still elevated.  Her neuropathy is worsening.  She has not been excercising or eating the way we once discussed several months previously.  She states that the insulin has resulted in an increase in her appetite and the neuropathy makes it hard for her to exercise.  She confesses that sometimes she forgets to take her lantus and she will dose herself.  For example, she has missed a dose and given herself almost twice the amount of lantus at one time.  She is currently taking 60 units twice daily.  Current Allergies: No known allergies       Physical Exam  General:     Very pleasant, cooperative, overweight white female in NAD Lungs:     Normal respiratory effort, chest expands symmetrically. Lungs are clear to auscultation, no crackles or wheezes. Heart:     Normal rate and regular rhythm. S1 and S2 normal without gallop, murmur, click, rub or other extra sounds. Abdomen:     Obese abdomen, soft, nontender, nondistended, active bowel sounds  Diabetes Management Exam:    Foot Exam (with socks and/or shoes not present):       Sensory-Pinprick/Light touch:          Left medial foot (L-4): diminished          Left dorsal foot (L-5): normal          Left lateral foot (S-1): diminished          Right medial foot (L-4): normal          Right dorsal foot (L-5): normal          Right  lateral foot (S-1): normal       Sensory-Monofilament:          Left foot: absent          Right foot: normal       Inspection:          Left foot: normal          Right foot: normal       Nails:          Left foot: normal          Right foot: normal    Impression & Recommendations:  Problem # 1:  DM W/NEURO MNFST, TYPE II, UNCONTROLLED (ICD-250.62) This patient was discussed with Dr. Jennette Kettle and she recommended that we restart her on her original regimen of glipizide since the pt was initially doing well on Glipizide.  Dr. Jennette Kettle also recommended switching from Lantus to Novolog 70/30 two times a day.  I have encouraged her to continue exercising as this will help her control her blood sugars and help keep the weight off.  She has  agreed.  We will see her again in one month.       The following medications were removed from the medication list:    Lantus Solostar 100 Unit/ml Soln (Insulin glargine) .Marland Kitchen... Take 35 units once in the morning and 35 once in the evening - subcutaneously    Januvia 100 Mg Tabs (Sitagliptin phosphate) .Marland Kitchen... 1 by mouth daily  Her updated medication list for this problem includes:    Glipizide 10 Mg Tabs (Glipizide) .Marland Kitchen... Take one tablet by mouth two times a day    Novolog Mix 70/30 70-30 % Susp (Insulin aspart prot & aspart) .Marland KitchenMarland KitchenMarland KitchenMarland Kitchen 40 units subcutaneous 30 minutes before 5pm meal and 20 units subcutaneous 30 minutes before 7am meal  Orders: A1C-[facility] (26378) Lipid-[facility] (58850-27741) Urinalysis-[facility] (00000) [facility]- Est  Level 4 (28786)   Problem # 2:  CHRONIC OBSTRUCTIVE PULMONARY DISEASE, ACUTE EXACERBATION (ICD-491.21) She has not been treated adquately for her COPD.  She has told myself and other providers that it is asthma but it appears to be more COPD than asthma.  She was started on spiriva and advair.  She was educated on the fact that albuterol is not a maintenance medication but a rescue medication.   Problem # 3:  ANXIETY STATE, UNSPECIFIED (ICD-300.00) Pt  reports symptoms of anxiety.  I will start her on a low dose of fluoxetine.  She is being started in the evening b/c she works third shift.   Her updated medication list for this problem includes:    Fluoxetine Hcl 10 Mg Caps (Fluoxetine hcl) ..... One tablet at 5pm daily   Complete Medication List: 1)  Albuterol 90 Mcg/act Aers (Albuterol) .... Inhale 2 puff using inhaler every four hours 2)  Glipizide 10 Mg Tabs (Glipizide) .... Take one tablet by mouth two times a day 3)  Albuterol Sulfate (2.5 Mg/61ml) 0.083% Nebu (Albuterol sulfate) .... Per manufacturer instructions as needed wheezing 4)  Novolog Mix 70/30 70-30 % Susp (Insulin aspart prot & aspart) .... 40 units subcutaneous 30 minutes before 5pm meal and 20 units subcutaneous 30 minutes before 7am meal 5)  Spiriva Handihaler 18 Mcg Caps (Tiotropium bromide monohydrate) .... One inhalation daily 6)  Advair Diskus 250-50 Mcg/dose Misc (Fluticasone-salmeterol) .... One inhalation in the morning and one inhalation in the evening 7)  Fluoxetine Hcl 10 Mg Caps (Fluoxetine hcl) .... One tablet at 5pm daily 8)  Promethazine Hcl 25 Mg Tabs (Promethazine hcl) .Marland Kitchen.. 1 tab by mouth every 6 to 8 hours for nausea and vomiting. 9)  Prednisone 50 Mg Tabs (Prednisone) .Marland Kitchen.. 1 tab by mouth daily x 7 days 10)  Zithromax 250 Mg Tabs (Azithromycin) .... 2 tabs por today  , then 1 tab by mouth daily x 4 days ( starting on 12/02)   Patient Instructions: 1)  Please schedule a follow-up appointment in 1 month 2)  Check blood sugar between 7-8am before breakfast and check blood sugar at 7pm, 2 hours after meal.   3)  Insulin (Novolin 70/30) 40 units 30 minutes before 5pm meal and 20 units 30 minutes before 7am meal. 4)  Take glipizide med as directed. 5)  Continue to exercise. 6)  COPD medicine: take as instructed.  Use albuterol only for emergency wheezing. 7)  Take anxiety medicine daily as instructed. 8)  Next time we will talk about lab results as well as  progress on new diabetes regimen.  Call in two weeks if you don't see improvements.    Prescriptions: FLUOXETINE HCL 10 MG  CAPS (FLUOXETINE HCL) one tablet at 5pm daily  #30 x 0   Entered and Authorized by:   [name]  MD   Signed by:   [name]  MD on 05/30/2007   Method used:   Electronically sent to ...       [facility]       [address], [address]       [place], [place]  [address]       Ph: [phone]       Fax: [phone]   RxID:   [id number] ADVAIR DISKUS 250-50 MCG/DOSE  MISC (FLUTICASONE-SALMETEROL) one inhalation in the morning and one inhalation in the evening  #1 x 0   Entered and Authorized by:   [name]  MD   Signed by:   [name]  MD on 05/30/2007   Method used:   Electronically sent to ...       [facility]       [address], [address]       [place], [place]  [address]       Ph: [phone]       Fax: [phone]   RxID:   [id number] NOVOLOG MIX 70/30 70-30 %  SUSP (INSULIN ASPART PROT & ASPART) 40 units subcutaneous 30 minutes before 5pm meal and 20 units subcutaneous 30 minutes before 7am meal  #10 ml x 1   Entered and Authorized by:   [name]  MD   Signed by:   [name]  MD on 05/30/2007   Method used:   Electronically sent to ...       [facility]       [address]       [place], [place]  [address]       Ph: [phone]       Fax: [phone]   RxID:   [id number] SPIRIVA HANDIHALER 18 MCG  CAPS (TIOTROPIUM BROMIDE MONOHYDRATE) one inhalation daily  #1 x 0   Entered and Authorized by:   [name]  MD   Signed by:   [name]  MD on 05/30/2007   Method used:   Electronically sent to ...       [facility]       [address]       [place], [place]  [address]       Ph: [phone]       Fax: [phone]   RxID:    [id number] GLIPIZIDE 10 MG  TABS (GLIPIZIDE) Take one tablet by mouth two times a day  #60 x 0   Entered and Authorized by:   [name]  MD   Signed by:   [name]  MD on 05/30/2007   Method used:   Electronically sent to ...       [facility]       [address]       [place], [place]  [address]       Ph: [phone]       Fax: [phone]   RxID:   [id number]  ] Laboratory Results   Urine Tests  Date/Time Received: May 30, 2007 2.49 PM  Date/Time Reported: May 30, 2007 3:07 PM   Routine Urinalysis   Color: yellow Appearance: Clear Glucose: 500   (Normal Range: Negative) Bilirubin: negative   (Normal Range: Negative) Ketone: small (15)   (Normal Range: Negative) Spec. Gravity: 1.020   (Normal Range: 1.003-1.035) Blood: negative   (Normal Range: Negative) pH: 7.0   (Normal Range: 5.0-8.0) Protein: 30   (Normal Range: Negative) Urobilinogen: 0.2   (Normal Range: 0-1) Nitrite: negative   (Normal Range: Negative) Leukocyte Esterace: negative   (Normal Range: Negative)  Urine Microscopic RBC/hpf: 0-3 Bacteria: 1+ Epithelial: 4-8 Other: few clue cells    Comments: ...............test performed by......Marland KitchenBonnie A. [place], [place] (ASCP)   Blood Tests   Date/Time Received: May 30, 2007 2:49 PM  Date/Time Reported: May 30, 2007 3:00 PM   HGBA1C: 9.5%   (Normal Range: Non-Diabetic - 3-6%   Control Diabetic - 6-8%)  Comments: ...................................................................DONNA [facility]  May 30, 2007 3:00 PM

## 2010-08-12 NOTE — Miscellaneous (Signed)
Summary: asthma QI   Clinical Lists Changes  Problems: Changed problem from ASTHMA, UNSPECIFIED (ICD-493.90) to ASTHMA, PERSISTENT (ICD-493.90)

## 2010-08-12 NOTE — Assessment & Plan Note (Signed)
Summary: F/U VISIT/[facility]   Vital Signs:  Patient Profile:   40 Years Old Female Height:     66.25 inches Weight:      219 pounds Temp:     98.1 degrees F Pulse rate:   100 / minute BP sitting:   131 / 87  Vitals Entered By: Arlyss Repress CMA, (Dec 07, 2007 1:44 PM)                 PCP:  [name]  MD  Chief Complaint:  Hospital f/u.  History of Present Illness: [name] is here for a hospital f/u for perinephric abscess s/p percutaneous drain admitted on 11/20/07 and discharged on 11/28/07.  Post-hospitalization: Afebrile.  c/o intermittent pain and a suspicious transient bulge on her right lateral flank.  She has finished her Cipro as prescribed.  Denies any dysuria, chills, N/V, diarrhea, or dec. appetite.  Denies any draining from the former drain site.  Diabetes: States that her blood sugars have been ranging in the 500s.  Still taking Novolog as prescribed along with Metformin and Glipizide.    Current Allergies: ! PENICILLIN ! * SINGULAIR  Past Medical History:    1.  Diabetes mellitus, type II    2.  h/o DVT    3.  Asthma    4.  Tobacco abuse    5.  COPD    6.  suspected uti 4/09--of note urine cx in [facility] from 11/16/07 showed >100,000 colonies of mixed species; recommended recollection as indicated; no sensitivities    7.  Hospitalized 5/09- perinephric abscess s/p percutaneous drain     Review of Systems      See HPI   Physical Exam  General:     Overweight, NAD, A&O x3 Skin:     No obvious bulge or mass on right flank; mildly tender to palpation; drain site without erythema, edema, or ecchymosis; healing appropriately    Impression & Recommendations:  Problem # 1:  Hx of PERINEPHRIC ABSCESS (ICD-590.2) Assessment: Comment Only Pt seems to be healing as expected except for new pain sx not likely associated with perinephric abscess.  Afebrile.  Will check UA and CBC today.  Instructed pt to call if she has any new sudden sx such as fevers,  chills, or pain similar to previous pain.  Pt understands and agrees.  Pt seen and d/w Dr. Leveda Anna.  Agrees that further studies not needed at this time.     Orders: Basic Met-[facility] (69629-52841) CBC w/Diff-[facility] (85025) Urinalysis-[facility] (00000) [facility]- Est  Level 4 (32440)   Problem # 2:  NEURALGIA (ICD-729.2) Assessment: New Unspecific pain of right lateral flank without evidence of mass or infection.  Likely related to percutaneous drain leading to neuropathic pain.  Pt seen and d/w Dr. Leveda Anna.  Treat with OTC. Orders: [facility]- Est  Level 4 (10272)   Problem # 3:  DM W/NEURO MNFST, TYPE II, UNCONTROLLED (ICD-250.62) Assessment: Deteriorated CBGs in the 500s per pt.  She has some insulin resistance evident during her hospitalization.  Given her financial status, I am limited to what medications I can prescribe.  Plan to inc. her to Novolog 40 units two times a day and reassess.   Her updated medication list for this problem includes:    Glipizide 10 Mg Tabs (Glipizide) .Marland Kitchen... Take one tablet by mouth two times a day    Novolog Mix 70/30 70-30 % Susp (Insulin aspart prot & aspart) .Marland KitchenMarland KitchenMarland KitchenMarland Kitchen 40 units subcutaneous 30 minutes before 5pm meal and  40 units subcutaneous 30 minutes before 7am meal    Metformin Hcl 500 Mg Tabs (Metformin hcl) .Marland Kitchen... Take two tablets with dinner once daily    Anacin 81 Mg Tbec (Aspirin) .Marland Kitchen... Take one tablet by mouth daily   Problem # 4:  HYPONATREMIA (ICD-276.1) Assessment: Comment Only Pt with hyponatremia prior to discharge from the hospital.  Na 131.  Will recheck today.   Orders: Basic Met-[facility] ([id number]) [facility]- Est  Level 4 (14782)   Complete Medication List: 1)  Albuterol 90 Mcg/act Aers (Albuterol) .... Inhale 2 puff using inhaler every four hours 2)  Glipizide 10 Mg Tabs (Glipizide) .... Take one tablet by mouth two times a day 3)  Albuterol Sulfate (2.5 Mg/91ml) 0.083% Nebu (Albuterol sulfate) .... Per manufacturer instructions as needed wheezing 4)  Novolog  Mix 70/30 70-30 % Susp (Insulin aspart prot & aspart) .... 40 units subcutaneous 30 minutes before 5pm meal and 40 units subcutaneous 30 minutes before 7am meal 5)  Advair Diskus 500-50 Mcg/dose Misc (Fluticasone-salmeterol) .Marland Kitchen.. 1 puff in the morning and 1 puff in the evening 6)  Fluoxetine Hcl 10 Mg Caps (Fluoxetine hcl) .... One tablet at 5pm daily 7)  Metformin Hcl 500 Mg Tabs (Metformin hcl) .... Take two tablets with dinner once daily 8)  Simvastatin 40 Mg Tabs (Simvastatin) .... One tablet by mouth at bedtime 9)  Anacin 81 Mg Tbec (Aspirin) .... Take one tablet by mouth daily 10)  Meloxicam 15 Mg Tabs (Meloxicam) .Marland Kitchen.. 1 tablet by mouth daily with food 11)  Promethazine Hcl 12.5 Mg Tabs (Promethazine hcl) .Marland Kitchen.. 1-2 tablets by mouth every 6-8 hours as needed for nausea 12)  Fluconazole 150 Mg Tabs (Fluconazole) .... One tablet by mouth once 13)  Miralax Powd (Polyethylene glycol 3350) .Marland Kitchen.. 17g dissolved in 8oz of water, twice a day for three days. thereafter 17g daily in 8oz water.  disp one tub. 14)  Vicodin 5-500 Mg Tabs (Hydrocodone-acetaminophen) .... One tablet every six hours for pain.   Patient Instructions: 1)  Please schedule a follow-up appointment in 1 month. 2)  Call the office if you have new fevers, chills, night sweats and we will consider repeat CT scan of your abdomen. 3)  I will check blood work and urine today and let you know the results on Monday.   4)  I want you to increase your Novolog to 40units twice a day.   ] Laboratory Results   Urine Tests  Date/Time Received: Dec 07, 2007 2:05 PM  Date/Time Reported: Dec 07, 2007 2:30 PM   Routine Urinalysis   Color: yellow Appearance: Clear Glucose: 500   (Normal Range: Negative) Bilirubin: negative   (Normal Range: Negative) Ketone: trace (5)   (Normal Range: Negative) Spec. Gravity: 1.025   (Normal Range: 1.003-1.035) Blood: large   (Normal Range: Negative) pH: 6.0   (Normal Range: 5.0-8.0) Protein: 100    (Normal Range: Negative) Urobilinogen: 0.2   (Normal Range: 0-1) Nitrite: negative   (Normal Range: Negative) Leukocyte Esterace: negative   (Normal Range: Negative)  Urine Microscopic WBC/HPF: 0-3 RBC/HPF: 20+ Bacteria/HPF: 1+ Epithelial/HPF: 1-5    Comments: ...........test performed by...........Marland KitchenTerese Door, CMA      Appended Document: F[facility]U VISIT/[facility] Hematuria noted on UA likely 2/2 menstrual bleeding.

## 2010-08-12 NOTE — Assessment & Plan Note (Signed)
Summary: f/u chronic issues   Vital Signs:  Patient profile:   40 year old female Height:      66.25 inches Weight:      227.50 pounds BMI:     36.57 Temp:     98.2 degrees F oral Pulse rate:   111 / minute BP sitting:   137 / 88  (left arm)  Vitals Entered By: Terese Door (April 30, 2009 3:46 PM) CC: symptoms not improved Is Patient Diabetic? Yes  Pain Assessment Patient in pain? no        Primary Care Provider:  [name]  MD  CC:  symptoms not improved.  History of Present Illness: 40yo F here b/c symptoms not improved  Head sensation:"water is running through my head" x 1 year.  Pt seen by Dr. Lafonda Mosses 2 days ago.   No fevers, N/V, vision changes.  Pain in the occiptal region.  Takes tramadol for the pain.    (although the pt does not offer this info, she was hospitalized in April 2010 with similar symptoms.  she had a normal head CT and otherwise normal work-up at that time. )  Asthma: States she is wheezing but using the inhaler.  Would like a nebulizer.  Still smoking 1/2 ppd.  No night time awakenings.  DM:  Currently on Novolog 70/30.  Has not tolerated oral agents in the past.   No hypoglycemic events.  CBGs typically run b/w 275-500.    Psych issues: States she had seen Dr. Donell Beers 3 weeks ago and it was helpful.  States that he started her on Klonopin 0.5mg  by mouth two times a day.  States she was referred to Tiajuana Amass (psychiatrist).  She has appt on 111/11/2008.  His office is at [address]  Ph # [phone].  HLD: Tolerating medication.  No adverse effects.  No muscle pain or abd pain.     Habits & Providers  Alcohol-Tobacco-Diet     Tobacco Status: current     Cigarette Packs/Day: 0.5  Current Medications (verified): 1)  Albuterol 90 Mcg/act Aers (Albuterol) .... Inhale 2 Puff Using Inhaler Every Four Hours W/ Spacer Please Disp Spacer As Well 2)  Novolog Mix 70/30 70-30 %  Susp (Insulin Aspart Prot & Aspart) .... 50  Units Subcutaneous 30 Minutes Before 5pm Meal and 55 Units Subcutaneous 30 Minutes Before 7am Meal 3)  Advair Diskus 500-50 Mcg/dose  Misc (Fluticasone-Salmeterol) .Marland Kitchen.. 1 Puff in The Morning and 1 Puff in The Evening 4)  Fluoxetine Hcl 40 Mg Caps (Fluoxetine Hcl) .Marland Kitchen.. 1 Tablet By Mouth Daily 5)  Simvastatin 40 Mg  Tabs (Simvastatin) .... One Tablet By Mouth At Bedtime 6)  Anacin 81 Mg  Tbec (Aspirin) .... Take One Tablet By Mouth Daily 7)  Miralax   Powd (Polyethylene Glycol 3350) .Marland Kitchen.. 17g Dissolved in 8oz of Water, Twice A Day For Three Days. Thereafter 17g Daily in [employer].  Disp One Tub. 8)  Compression Stockings .... Wear Them For Lower Extremity Swelling As Needed 9)  Prilosec 20 Mg Cpdr (Omeprazole) .... Take One Tablet Each Morning 10)  Clonazepam 0.5 Mg Tabs (Clonazepam) .Marland Kitchen.. 1 Tab Two Times A Day Scheduled.  One Tab Mid-Day As Needed Per Dr. Donell Beers (Psych) 11)  Tramadol Hcl 50 Mg Tabs (Tramadol Hcl) .Marland Kitchen.. 1 By Mouth Q 6 Hours As Needed Breakthrough Pain 12)  Januvia 100 Mg Tabs (Sitagliptin Phosphate) .... 1/2 Tablet By Mouth Daily For Diabetes  Allergies (verified): 1)  !  Penicillin 2)  ! * Singulair 3)  ! * Morphine 4)  Amoxicillin (Amoxicillin) 5)  Amoxicillin (Amoxicillin)  Review of Systems        No fevers, N/V, vision changes.  Pain in the occiptal region.   No muscle pain or abd pain.  Physical Exam  General:  VS reviewed.  Pt in her usual state...slow speech, rocking back and forth, and childlike yet alert and oriented Head:  normocephalic and atraumatic.   Eyes:  EOMI PERRLA Neck:  full ROM no goiter or palpable mass Lungs:  No resp distress mild end exp wheezing no crackles Heart:  Normal rate and regular rhythm. S1 and S2 normal without gallop, murmur, click, rub or other extra sounds. Neurologic:  alert & oriented X3 and cranial nerves II-XII intact.   no focal deficits Skin:  color normal.   Psych:  no danger to self or others mild improvement in  speech and psychomotor agitation   Impression & Recommendations:  Problem # 1:  HEADACHE (ICD-784.0) Assessment Unchanged Her "head symptoms" is unclear and nonspecific.  I agree with Dr. Lafonda Mosses that she has had a thorough work up during her hospitalizations. No neurological deficits on exam today.  Her behavior is baseline and maybe even slightly improved from prior visits.  I have seen her multiple times in the past few months and she has never described these symptoms to me.  No red flags that require immediate intervention or imaging. She is not elgible for MRI given the bibi in her head.  I will check a TSH on her just b/c it may be the only test that hasn't been checked and thyroid can cause all types of symptoms.  Will have her f/u if no improvement with time.     Her updated medication list for this problem includes:    Anacin 81 Mg Tbec (Aspirin) .Marland Kitchen... Take one tablet by mouth daily    Tramadol Hcl 50 Mg Tabs (Tramadol hcl) .Marland Kitchen... 1 by mouth q 6 hours as needed breakthrough pain  Orders: [facility]- Est  Level 4 (19147)  Problem # 2:  ASTHMA, UNSPECIFIED (ICD-493.90) Assessment: Unchanged I denied the request for nebulizer treatment b/c she is an adult and capable of using an inhaler with a spacer.  I don't think her asthma has deteriorated.  Ultimately, she needs to quit smoking.  I spent time counseling about the effects of tobacco smoke on her asthma.  Her updated medication list for this problem includes:    Albuterol 90 Mcg/act Aers (Albuterol) ..... Inhale 2 puff using inhaler every four hours w/ spacer please disp spacer as well    Advair Diskus 500-50 Mcg/dose Misc (Fluticasone-salmeterol) .Marland Kitchen... 1 puff in the morning and 1 puff in the evening  Orders: [facility]- Est  Level 4 (82956)  Problem # 3:  DM W/NEURO MNFST, TYPE II, UNCONTROLLED (ICD-250.62) Assessment: Deteriorated Not adequately controlled.  Not at goal.  Did not tolerate metformin or glipizide in the past.  Currently  on Novolog 70/30 two times a day.  Plan to start Januvia 50mg  daily.  This may help with weight loss as well.   Her updated medication list for this problem includes:    Novolog Mix 70/30 70-30 % Susp (Insulin aspart prot & aspart) .Marland KitchenMarland KitchenMarland KitchenMarland Kitchen 50 units subcutaneous 30 minutes before 5pm meal and 55 units subcutaneous 30 minutes before 7am meal    Anacin 81 Mg Tbec (Aspirin) .Marland Kitchen... Take one tablet by mouth daily    Januvia 100 Mg Tabs (  Sitagliptin phosphate) .Marland Kitchen... 1/2 tablet by mouth daily for diabetes  Orders: Direct LDL-[facility] ([id number]) [facility]- Est  Level 4 (86578)  Problem # 4:  HYPERTRIGLYCERIDEMIA (ICD-272.1) Assessment: Unchanged Check LDL today.   Her updated medication list for this problem includes:    Simvastatin 40 Mg Tabs (Simvastatin) ..... One tablet by mouth at bedtime  Orders: [facility]- Est  Level 4 (46962)  Problem # 5:  ALTERED MENTAL STATUS (ICD-780.97) Assessment: Improved It seems to have improved since she received her disability.  She has seen Dr. Donell Beers and has been referrred to [employer] (psychiatrist).  She has an appt on 05/15/2009.  I will request those office notes.  She states that she has been placed on klonopin 0.5mg  two times a day.    Complete Medication List: 1)  Albuterol 90 Mcg/act Aers (Albuterol) .... Inhale 2 puff using inhaler every four hours w/ spacer please disp spacer as well 2)  Novolog Mix 70/30 70-30 % Susp (Insulin aspart prot & aspart) .... 50 units subcutaneous 30 minutes before 5pm meal and 55 units subcutaneous 30 minutes before 7am meal 3)  Advair Diskus 500-50 Mcg/dose Misc (Fluticasone-salmeterol) .Marland Kitchen.. 1 puff in the morning and 1 puff in the evening 4)  Fluoxetine Hcl 40 Mg Caps (Fluoxetine hcl) .Marland Kitchen.. 1 tablet by mouth daily 5)  Simvastatin 40 Mg Tabs (Simvastatin) .... One tablet by mouth at bedtime 6)  Anacin 81 Mg Tbec (Aspirin) .... Take one tablet by mouth daily 7)  Miralax Powd (Polyethylene glycol 3350) .Marland Kitchen.. 17g dissolved in 8oz  of water, twice a day for three days. thereafter 17g daily in 8oz water.  disp one tub. 8)  Compression Stockings  .... Wear them for lower extremity swelling as needed 9)  Prilosec 20 Mg Cpdr (Omeprazole) .... Take one tablet each morning 10)  Clonazepam 0.5 Mg Tabs (Clonazepam) .Marland Kitchen.. 1 tab two times a day scheduled.  one tab mid-day as needed per dr. Donell Beers (psych) 11)  Tramadol Hcl 50 Mg Tabs (Tramadol hcl) .Marland Kitchen.. 1 by mouth q 6 hours as needed breakthrough pain 12)  Januvia 100 Mg Tabs (Sitagliptin phosphate) .... 1/2 tablet by mouth daily for diabetes  Other Orders: TSH-[facility] ([id number]) Influenza Vaccine NON MCR (01027)  Patient Instructions: 1)  If your wheezing worsens and you continue to use your inhaler everyday, I want you to follow up with Dr. Raymondo Band to evaluate your lung function. 2)  Stop Smoking!!! 3)  I started you on a new medication for your diabetes called Januvia which you take 1/2 tablet a day. 4)  I'm checking your thyroid to evaluate your head symptoms. Prescriptions: JANUVIA 100 MG TABS (SITAGLIPTIN PHOSPHATE) 1/2 tablet by mouth daily for diabetes  #34 x 1   Entered and Authorized by:   [name]  MD   Signed by:   [name]  MD on 04/30/2009   Method used:   Electronically to        [facility]  [address] (retail)       [address], [address]       [place], [place]  [address]       Ph: [phone]       Fax: [phone]   RxID:   [id number]    Influenza Vaccine    Vaccine Type: Fluvax Non-MCR    Site: left deltoid    Mfr: GlaxoSmithKline    Dose: 0.5 ml    Route: IM  Given by: Terese Door    Exp. Date: 01/07/2010    Lot #: [id number]    VIS given: 02/17/2009  Flu Vaccine Consent Questions    Do you have a history of severe allergic reactions to this vaccine? no    Any prior history of allergic reactions to egg and/or gelatin? no    Do you have a sensitivity to the preservative Thimersol? no    Do  you have a past history of Guillan-Barre Syndrome? no    Do you currently have an acute febrile illness? no    Have you ever had a severe reaction to latex? no    Vaccine information given and explained to patient? yes    Are you currently pregnant? no   Prevention & Chronic Care Immunizations   Influenza vaccine: Fluvax Non-MCR  (04/30/2009)   Influenza vaccine due: 03/28/2009    Tetanus booster: Not documented    Pneumococcal vaccine: Not documented  Other Screening   Pap smear: Not documented    Mammogram: Not documented   Smoking status: current  (04/30/2009)   Smoking cessation counseling: yes  (08/07/2008)  Diabetes Mellitus   HgbA1C: 12.2  (02/25/2009)   Hemoglobin A1C due: 11/30/2007    Eye exam: Not documented    Foot exam: yes  (05/09/2008)   High risk foot: Not documented   Foot care education: Not documented   Foot exam due: 10/29/2008    Urine microalbumin/creatinine ratio: Not documented    Diabetes flowsheet reviewed?: Yes   Progress toward A1C goal: Unchanged  Lipids   Total Cholesterol: 217  (09/02/2007)   LDL: See Comment mg/dL  (04/54/0981)   LDL Direct: Not documented   HDL: 32  (09/02/2007)   Triglycerides: 462  (09/02/2007)    SGOT (AST): 17  (07/17/2007)   SGPT (ALT): 27  (07/17/2007)   Alkaline phosphatase: 108  (07/17/2007)   Total bilirubin: 0.4  (07/17/2007)    Lipid flowsheet reviewed?: Yes   Progress toward LDL goal: Unchanged  Self-Management Support :   Personal Goals (by the next clinic visit) :     Personal A1C goal: 8  (04/30/2009)     Personal blood pressure goal: 140/90  (04/30/2009)     Personal LDL goal: 100  (04/30/2009)    Patient will work on the following items until the next clinic visit to reach self-care goals:     Medications and monitoring: take my medicines every day, check my blood sugar, bring all of my medications to every visit  (04/30/2009)     Eating: drink diet soda or water instead of juice or  soda, eat more vegetables, use fresh or frozen vegetables, eat foods that are low in salt, eat baked foods instead of fried foods, eat fruit for snacks and desserts  (04/30/2009)     Activity: take a 30 minute walk every day  (04/30/2009)    Diabetes self-management support: Written self-care plan  (04/30/2009)   Diabetes care plan printed    Lipid self-management support: Written self-care plan  (04/30/2009)   Lipid self-care plan printed.

## 2010-08-12 NOTE — Consult Note (Signed)
Summary: [facility]  [facility]   Imported By: Clydell Hakim 06/02/2009 12:18:16  _____________________________________________________________________  External Attachment:    Type:   Image     Comment:   External Document  Appended Document: [facility] Reviewed.

## 2010-08-12 NOTE — Letter (Signed)
Summary: Out of Work  [facility]  [address]   [place], [place] [address]   Phone: [phone]  Fax: [phone]    June 11, 2007   Employee:  [employer]    To Whom It May Concern:   For Medical reasons, please excuse the above named employee from work for the following dates:  Start:   06-11-07  End:   06-13-07  If you need additional information, please feel free to contact our office.         Sincerely,    THEKLA SLADE CMA,
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
## 2010-08-12 NOTE — Progress Notes (Signed)
Summary: GI  Phone Note Call from Patient Call back at [phone]   Caller: [name] Summary of Call: Pt went to hospital over the weekend and was told she would need to call [facility] to get an appt scheduled ASAP for surgery.  [name] states he called to schedule the appt and they told him first avail. was in June and he would like to discuss to see if we can get her in sooner. Initial call taken by: Haydee Salter,  Nov 19, 2007 8:37 AM  Follow-up for Phone Call        spoke with husband and patient and advised and pt went to ER yesterday due to rt side pain . on ultrasound  gallstones were seen he states . appointment scheduled to come to office today and will proceed with referral and determine urgency. Follow-up by: Theresia Lo RN,  Nov 19, 2007 8:57 AM

## 2010-08-12 NOTE — Progress Notes (Signed)
Summary: phn msg   Phone Note Call from Patient Call back at Home Phone [phone]   Caller: Patient Summary of Call: had surgery last year and is now hurting on the same side - not sure if she pulled something Initial call taken by: De Nurse,  March 02, 2010 2:10 PM  Follow-up for Phone Call        LM Follow-up by: Golden Circle RN,  March 02, 2010 2:13 PM  Additional Follow-up for Phone Call Additional follow up Details #1::        states she had an ulcer in the past. has pain on R side x 2-3 wks. states it is where she had the tube before. site is healed but pain has started again. pain is 8/10. asked her to come now to be worked in Additional Follow-up by: Golden Circle RN,  March 02, 2010 2:30 PM    Additional Follow-up for Phone Call Additional follow up Details #2::    noted that she was seen by Dr. Karie Schwalbe

## 2010-08-12 NOTE — Progress Notes (Signed)
Summary: Novolog 70/30 #2 x1   Phone Note Call from Patient Call back at Home Phone [phone] Call back at [phone]   Caller: Patient Summary of Call: needs rx for wheelchair. and needs rx for insulin 70 30 mix called in. would like to pick up rx for wheelchair today Initial call taken by: Clydell Hakim,  October 17, 2008 3:00 PM  Follow-up for Phone Call        called patient and she states her wheelchair broke today and she needs to get another today. explained will send message to MD but not sure we can get this done today. [facility] [place]. states she has 1/2 bottle left. she will call back with fax number for wheelchair rx to be faxed. Follow-up by: Theresia Lo RN,  October 17, 2008 3:06 PM      Prescriptions: NOVOLOG MIX 70/30 70-30 %  SUSP (INSULIN ASPART PROT & ASPART) 55 units subcutaneous 30 minutes before 5pm meal and 50 units subcutaneous 30 minutes before 7am meal  #2 x 1   Entered and Authorized by:   [name]  MD   Signed by:   [name]  MD on 10/17/2008   Method used:   Electronically to        [facility] [address] (retail)       [address]       [place], [place]  [address]       Ph: [phone]       Fax: [phone]   RxID:   [id number]   Appended Document: Novolog 70/30 #2 x1 consulted with Dr. Burnadette Pop about rx for wheelchair and he advises he will not ok this. patient does not understand why he will not ok. states she has been using mother's wheelchair and it is now broken . she has already gotten new wheelchair. advised she will need to discuss with Dr. Burnadette Pop at an appointment. states she needs chair because she continues to pass out. tried to schedule appointment but MD's next avilable  is not until 11/12/2008. will adk MD if she can be doubled booked.
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
## 2010-08-12 NOTE — Progress Notes (Signed)
Summary: triage   Phone Note Call from Patient Call back at Home Phone [phone]   Caller: Patient Summary of Call: has real bad heartburn and would like the after shock drink  Initial call taken by: De Nurse,  May 13, 2009 11:31 AM  Follow-up for Phone Call        states she is taking her ppi two times a day w/o relief. also chewing on tums & rolaids. this is limiting what she eats & drinks. appt at 1:30 to see a doctor. Follow-up by: Golden Circle RN,  May 13, 2009 11:33 AM  Additional Follow-up for Phone Call Additional follow up Details #1::        pt's mom called back  & said she was taking her to the ED since the pain was so bad. she does not think she can wait until 1:30.  I did not cancel the 1:30 app as they may still be waiting to be seen there & came here instead Additional Follow-up by: Golden Circle RN,  May 13, 2009 11:44 AM    Additional Follow-up for Phone Call Additional follow up Details #2::    will f/u as needed  Follow-up by: [name]  MD,  May 13, 2009 2:54 PM

## 2010-08-12 NOTE — Progress Notes (Signed)
Summary: Novolog 70/30 #3 x2  Medications Added NOVOLOG MIX 70/30 70-30 %  SUSP (INSULIN ASPART PROT & ASPART) 50 units subcutaneous 30 minutes before 5pm meal and 55 units subcutaneous 30 minutes before 7am meal       Phone Note Call from Patient Call back at Home Phone [phone]   Caller: Patient Summary of Call: pt needs insulin called in [facility] in [place]. Pt states she takes 50 units in am and 55 in p.m. Initial call taken by: Clydell Hakim,  January 13, 2009 10:04 AM  Follow-up for Phone Call        will send message to MD. Follow-up by: Theresia Lo RN,  January 13, 2009 10:19 AM    New/Updated Medications: NOVOLOG MIX 70/30 70-30 %  SUSP (INSULIN ASPART PROT & ASPART) 50 units subcutaneous 30 minutes before 5pm meal and 55 units subcutaneous 30 minutes before 7am meal   Prescriptions: NOVOLOG MIX 70/30 70-30 %  SUSP (INSULIN ASPART PROT & ASPART) 50 units subcutaneous 30 minutes before 5pm meal and 55 units subcutaneous 30 minutes before 7am meal  #3 x 2   Entered and Authorized by:   [name]  MD   Signed by:   [name]  MD on 01/13/2009   Method used:   Telephoned to ...       [facility] [address] ([facility]ail)       [address]       [place], [place]  [address]       Ph: [phone]       Fax: [phone]   RxID:   [id number]

## 2010-08-12 NOTE — Progress Notes (Signed)
Summary: WI request  Phone Note Call from Patient Call back at Home Phone [phone]   Summary of Call: pt wants to be seen for loss of voice Initial call taken by: Haydee Salter,  January 11, 2007 12:02 PM  Follow-up for Phone Call        pt reports chest tightness for past week. has history of asthma. sl cough with yellow phelgm. had fever of 100 yesterday. appoinmtnet scheduled today. Follow-up by: Theresia Lo RN,  January 11, 2007 12:16 PM

## 2010-08-12 NOTE — Assessment & Plan Note (Signed)
Summary: diarrhea x 1 wk/[place]   Vital Signs:  Patient profile:   40 year old female Temp:     98.3 degrees F oral Pulse rate:   103 / minute BP sitting:   110 / 61  (left arm)  Vitals Entered By: Alphia Kava (February 09, 2009 11:28 AM) CC: diarrhea x 1 week Is Patient Diabetic? No   Primary Care Provider:  [name]  MD  CC:  diarrhea x 1 week.  History of Present Illness: 40 y/o F here for diarrhea Diarrhea x 1 week, vomiting on Fri and Sat, non bloody diarrhea, brown in color, watery and with some solids.  Pt took 2 bottles of Immodium, but that did not help.  Last time pt had diarrhea like this was about 1 month.  At that time she took Imodium, which helped.  No new meds, no new foods, no new environment or travel.  Diarrhea has not gotten any better.  Pt drinking 2 liters water and pedialyte everyday.  No fever, chills, or other symptoms.  Pt's brother just how one episode of diarrhea today.  Habits & Providers  Alcohol-Tobacco-Diet     Tobacco Status: current     Cigarette Packs/Day: 0.5  Allergies: 1)  ! Penicillin 2)  ! * Singulair 3)  ! * Morphine 4)  Amoxicillin (Amoxicillin) 5)  Amoxicillin (Amoxicillin)  Social History: Packs/Day:  0.5  Review of Systems       per hpi  Physical Exam  General:  Well-developed,well-nourished,in no acute distress; alert,appropriate and cooperative throughout examination.  Vitals reviewed.  Pt in wheelchair. Mouth:  MMM Neck:  No cervical nodes Heart:  Normal rate and regular rhythm. S1 and S2 normal without gallop, murmur, click, rub or other extra sounds. Neurologic:  Speech is slurred and slow.   Skin:  turgor normal.     Impression & Recommendations:  Problem # 1:  DIARRHEA (ICD-787.91) Assessment New  Symptoms most likely gastroeneteritis in etiology given that pt had diarrhea --> vomiting + diarrhea.  Symptoms most likely not food poisoning as pt cannot identify any new foods and no other family member with  similar symptoms until today.  Advised pt that this will need to run its course for 10-14 days.  Advised pt to continue pushing fluids and to avoid meds like Immodium as it leads to buildup of bacteria in the body.  Pt to rtc in 1 wk if not feeling better or sooner if there is blood in stool.  If not better in 1 wk we may consider stool studies.    Orders: [facility]- Est Level  3 ([address])  Complete Medication List: 1)  Albuterol 90 Mcg/act Aers (Albuterol) .... Inhale 2 puff using inhaler every four hours 2)  Albuterol Sulfate (2.5 Mg/56ml) 0.083% Nebu (Albuterol sulfate) .... Per manufacturer instructions as needed wheezing 3)  Novolog Mix 70/30 70-30 % Susp (Insulin aspart prot & aspart) .... 50 units subcutaneous 30 minutes before 5pm meal and 55 units subcutaneous 30 minutes before 7am meal 4)  Advair Diskus 500-50 Mcg/dose Misc (Fluticasone-salmeterol) .Marland Kitchen.. 1 puff in the morning and 1 puff in the evening 5)  Fluoxetine Hcl 40 Mg Caps (Fluoxetine hcl) .... 2 tablets by mouth daily 6)  Simvastatin 40 Mg Tabs (Simvastatin) .... One tablet by mouth at bedtime 7)  Anacin 81 Mg Tbec (Aspirin) .... Take one tablet by mouth daily 8)  Miralax Powd (Polyethylene glycol 3350) .Marland Kitchen.. 17g dissolved in 8oz of water, twice a day for  three days. thereafter 17g daily in 8oz water.  disp one tub. 9)  Compression Stockings  .... Wear them for lower extremity swelling as needed 10)  Furosemide 20 Mg Tabs (Furosemide) .... One tablet by mouth daily as needed for lower extremity swelling 11)  Prilosec 20 Mg Cpdr (Omeprazole) .... Take one tablet each morning  Patient Instructions: 1)  Please schedule a follow-up appointment as needed next week if symptoms are not better. 2)  Continue to drink plenty of fluids as you continue to have diarrhea.  This may take another week to resolve.  Call the [facility] if you have bloody diarrhea.   Prevention & Chronic Care Immunizations   Influenza vaccine: Fluvax Non-MCR  (03/28/2008)    Influenza vaccine due: 03/28/2009    Tetanus booster: Not documented    Pneumococcal vaccine: Not documented  Other Screening   Pap smear: Not documented    Mammogram: Not documented   Smoking status: current  (02/09/2009)   Smoking cessation counseling: yes  (08/07/2008)  Diabetes Mellitus   HgbA1C: 11.3  (11/24/2008)   Hemoglobin A1C due: 11/30/2007    Eye exam: Not documented    Foot exam: yes  (05/09/2008)   High risk foot: Not documented   Foot care education: Not documented   Foot exam due: 10/29/2008    Urine microalbumin/creatinine ratio: Not documented  Lipids   Total Cholesterol: 217  (09/02/2007)   LDL: See Comment mg/dL  (04/54/0981)   LDL Direct: Not documented   HDL: 32  (09/02/2007)   Triglycerides: 462  (09/02/2007)    SGOT (AST): 17  (07/17/2007)   SGPT (ALT): 27  (07/17/2007)   Alkaline phosphatase: 108  (07/17/2007)   Total bilirubin: 0.4  (07/17/2007)  Self-Management Support :    Diabetes self-management support: Not documented    Lipid self-management support: Not documented

## 2010-08-12 NOTE — Assessment & Plan Note (Signed)
Summary: bilateral foot & leg pain   Vital Signs:  Patient Profile:   40 Years Old Female Height:     66.25 inches Weight:      233.1 pounds BMI:     37.47 Temp:     97.1 degrees F Pulse rate:   87 / minute BP sitting:   143 / 78  Pt. in pain?   yes    Location:   bil feet    Intensity:   10                  PCP:  [name]  MD  Chief Complaint:  foot pain.  History of Present Illness: 40yr old uncontrolled diabetic smoker presents with bilateral foot pain. Pt reports bilateral burning tingling pain in her feet that is constant and worse at night that keeps her awake. Nothing relieves the pain.  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  This pain is relieved by aleve/naprosen/tylenol and is now begining to have the same pain in her R heel.  No sciatica, no weakness of lower extrem, no back pain.  Pain worsening over the last 28m to 1year.   Current Allergies (reviewed today): No known allergies   Past Medical History:    Reviewed history from 03/13/2007 and no changes required:       1.  Diabetes mellitus, type II       2.  h/o DVT       3.  Asthma       4.  Tobacco abuse       5.  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 No edema, no abrasions, no ulcerations.  Full ROM of both ankels and all toes, normal gait.  Pain to palpation over medical aspect of heel L > R.  Mild pain with toe extension.  Sensation intake. Burning pain c/o circumferential pattern beginning at toes and ascending to the ankle. 2+ pedal pulses  B. Extremities:     no edema Neurologic:     alert & oriented X3, strength normal in all extremities, sensation intact to light touch, gait normal, and DTRs symmetrical and normal.   Skin:     turgor normal and color normal.   Psych:     Oriented X3, memory intact for recent and remote, normally interactive, good eye contact, not anxious appearing, and not depressed appearing.      Impression & Recommendations:  Problem # 1:  DIABETIC PERIPHERAL NEUROPATHY (ICD-250.60) Start neurontin 300mg  by mouth at bedtime. Advised of sedation.  May need titration up per pcp. Need better DM control and impressed them upon pt. Also smoking cessation encouraged.   Her updated medication list for this problem includes:    Lantus Solostar 100 Unit/ml Soln (Insulin glargine) .Marland Kitchen... Take 35 units once in the morning and 35 once in the evening - subcutaneously    Glipizide 10 Mg Tabs (Glipizide) .Marland Kitchen... Take one tablet by mouth two times a day  Januvia 100 Mg Tabs (Sitagliptin phosphate) .Marland Kitchen... 1 by mouth daily  Orders: [facility]- Est  Level 4 ([address])   Problem # 2:  PLANTAR FASCIITIS (ICD-728.71) L > R.  NSAIDs, shoe inserts, and stretching exercises. Information given on PF and stretching. Ice as tolerated.  May consider having diabetic shoes made with orthodics to help with mechanics. Advised weight loss.  Orders: [facility]- Est  Level 4 ([address]) [facility] [employer] [phone])   Problem # 3:  OBESITY NOS (ICD-278.00) Advised weight loss to help with PF and overall health. Orders: [facility]- Est  Level 4 ([address])   Problem # 4:  TOBACCO DEPENDENCE (ICD-305.1) Encouarged cessation. Pt not currently interested.  Orders: [facility]- Est  Level 4 ([address])   Complete Medication List: 1)  Albuterol 90 Mcg/act Aers (Albuterol) .... Inhale 2 puff using inhaler every four hours 2)  Advair Diskus 500-50 Mcg/dose Misc (Fluticasone-salmeterol) .Marland Kitchen.. 1 puff as directed twice a day 3)  Lantus Solostar 100 Unit/ml Soln (Insulin  glargine) .... Take 35 units once in the morning and 35 once in the evening - subcutaneously 4)  Glipizide 10 Mg Tabs (Glipizide) .... Take one tablet by mouth two times a day 5)  Albuterol Sulfate (2.5 Mg/77ml) 0.083% Nebu (Albuterol sulfate) .... Per manufacturer instructions as needed wheezing 6)  Januvia 100 Mg Tabs (Sitagliptin phosphate) .Marland Kitchen.. 1 by mouth daily 7)  Diflucan 150 Mg Tabs (Fluconazole) .Marland Kitchen.. 1 by mouth x1 8)  Neurontin 300 Mg Caps (Gabapentin) .... One tablet by mouth at bedtime   Patient Instructions: 1)  Schedule an appointment with Dr Burnadette Pop at his next available to discuss your diabetes further.  2)  You have two problems in your feet: 3)  1) Plantar Fasciitis - This will take a very long time to get better.  You can use ibuprofen 600mg  by mouth q 4-6 hours OR naproxen/aleve 220mg  every 8 hours.  You can also take tylenol 650mg  by mouth every 4-6 hours on top of the ibuprofen. DO NOT MIX aleve/naproxen/ibuprofen.  Stretching exercises like we showed you at least 2-3times a day.  Ice also. 4)  2) Diabetic Neuropathy - This is from your diabetes being out of control. Start a nerve medicine called Neurontin. It is sedating so take it only at night for now and your regular doctor may increase the dose if it's not enough in the future. 300mg  before bed.     Prescriptions: NEURONTIN 300 MG  CAPS (GABAPENTIN) one tablet by mouth at bedtime  #30 x 3   Entered and Authorized by:   Lupita Raider MD   Signed by:   Lupita Raider MD on 05/23/2007   Method used:   Electronically sent to ...       [facility]  [address]       [address]       [place], [place]  [address]       Ph: [phone]       Fax: [phone]   RxID:   [id number]  ]
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
## 2010-08-12 NOTE — Progress Notes (Signed)
  Phone Note Call from Patient   Caller: Patient Summary of Call: [name] is calling because she developed substernal chest pain described as pressure associated with SOB while walking to her car after shopping at [facility] today.  The chest pain did not get better with rest.  She did not try any medication.  She is on her way to the [facility] ER as she is talking to me--pulling into the parking lot.  Advised her that being seen in the ER for this is what I would recommend.  Patient in agreement. Initial call taken by: Levander Campion MD,  September 01, 2007 9:22 PM

## 2010-08-12 NOTE — Miscellaneous (Signed)
Summary: medication for vulvovaginitis (candidiasis)  Clinical Lists Changes  Medications: Added new medication of FLUCONAZOLE 150 MG  TABS (FLUCONAZOLE) one tablet by mouth once - Signed Rx of FLUCONAZOLE 150 MG  TABS (FLUCONAZOLE) one tablet by mouth once;  #1 x 0;  Signed;  Entered by: [name]  MD;  Authorized by: [name]  MD;  Method used: Electronic    Prescriptions: FLUCONAZOLE 150 MG  TABS (FLUCONAZOLE) one tablet by mouth once  #1 x 0   Entered and Authorized by:   [name]  MD   Signed by:   [name]  MD on 11/07/2007   Method used:   Electronically sent to ...       [facility] [address]       [address]       [place], [place]  [address]       Ph: [phone]       Fax: [phone]   RxID:   [id number]

## 2010-08-12 NOTE — Progress Notes (Signed)
Summary: refilled Lantus   Phone Note Refill Request Call back at Home Phone [phone] Message from:  Patient  Refills Requested: Medication #1:  LANTUS 100 UNIT/ML SOLN Inject 45 units twice daily and increase as instructed. [facility]  Initial call taken by: De Nurse,  April 21, 2010 9:35 AM    Prescriptions: LANTUS 100 UNIT/ML SOLN (INSULIN GLARGINE) Inject 45 units twice daily and increase as instructed.  #1 x 5   Entered and Authorized by:   Jamie Brookes MD   Signed by:   Jamie Brookes MD on 04/21/2010   Method used:   Electronically to        [facility] (retail)       [address]       [place], [place]  [address]       Ph: [phone] or [phone]       Fax: [phone]   RxID:   [id number]

## 2010-08-12 NOTE — Assessment & Plan Note (Signed)
Summary: neuralgia, syncope   Vital Signs:  Patient Profile:   40 Years Old Female Height:     66.25 inches Weight:      225.7 pounds BMI:     36.29 Pulse rate:   102 / minute BP sitting:   129 / 84  (right arm)  Pt. in pain?   yes    Location:   legs    Intensity:   5  Vitals Entered By: Arlyss Repress CMA, (June 16, 2008 10:17 AM)              Is Patient Diabetic? Yes     PCP:  [name]  MD  Chief Complaint:  syncope and leg pain.  History of Present Illness: 40yo F w/ uncontrolled DM here for leg pain and syncope  Leg Pain: Continues to have b/l leg pain and gabapentin is not helping.  Sttes that she is walking less b/c of pain.  No swelling.    Syncope: Husband reports that she is passing out lately.  "She will c/o SOB and hyperventilate then will pass out during albuterol tx." No recent hx of trauma.  No changes in routine or appetite or meds except that she has stop taking the fluoxetine.  During these episodes, CBGs are in 300s.  DM II: Currently taking the Novolog 70/30 40units two times a day without complications.  Also tolerating the metformin.      Current Allergies: ! PENICILLIN ! * SINGULAIR    Risk Factors:     Counseled to quit/cut down tobacco use:  yes   Review of Systems      See HPI   Physical Exam  General:     Obese, non ill appearing WF, NAD Eyes:     EOMI Neurologic:     alert & oriented X3, cranial nerves II-XII intact Gait: favors left leg and mild limp; not appearing instable Psych:     Pt very hypervigilant of multiple symptoms.    Impression & Recommendations:  Problem # 1:  SYNCOPE (ICD-780.2) Assessment: New Hx of passing out is likely related to hyperventilation.  Difficult to assess if there is truly an organic cause given her known hx of being hypervigilant about symptoms and inquiring/desiring disability.  I think she has a degree of somatization.  I have asked her to identify stresses in her life  and find positive ways to deal with those stresses.  She will start back on the fluoxetine as prescribed.  We will reassess in 2 months.  She has had w/u in the past with a nl echo and stress test.  She is on lipid controlling meds and ASA.   Orders: [facility]- Est  Level 4 ([address])   Problem # 2:  NEURALGIA (ICD-729.2) Assessment: Unchanged Gabapentin is not helping so I will discontinue this b/c she is c/o the cost.  Still think it is related to uncontrolled DM II. Orders: [facility]- Est  Level 4 ([address])   Problem # 3:  DM W/NEURO MNFST, TYPE II, UNCONTROLLED (ICD-250.62) Assessment: Unchanged She is reluctant to inc her insulin dose although I think she needs this.  I'm concern about the degree of compliance with medication.  I'm going to have her see Dr. Raymondo Band in diabetes clinic in hopes that he can help her better control the diabetes.   Her updated medication list for this problem includes:    Glipizide 10 Mg Tabs (Glipizide) .Marland Kitchen... Take one tablet by mouth two times a day  Novolog Mix 70/30 70-30 % Susp (Insulin aspart prot & aspart) .Marland KitchenMarland KitchenMarland KitchenMarland Kitchen 40 units subcutaneous 30 minutes before 5pm meal and 40 units subcutaneous 30 minutes before 7am meal    Metformin Hcl 1000 Mg Tabs (Metformin hcl) .Marland Kitchen... 1 tablet by mouth two times a day    Anacin 81 Mg Tbec (Aspirin) .Marland Kitchen... Take one tablet by mouth daily  Orders: [facility]- Est  Level 4 ([address])   Problem # 4:  OBESITY NOS (ICD-278.00) Assessment: Unchanged Encouraged to continue with daily physical activity and to think about purchasing exercise equipment to enable her to workout in the house.  Discussed proper nutrition. Orders: [facility]- Est  Level 4 ([address])   Problem # 5:  TOBACCO DEPENDENCE (ICD-305.1) Assessment: Unchanged Smoking cessation discussed.   Orders: [facility]- Est  Level 4 ([address])   Complete Medication List: 1)  Albuterol 90 Mcg/act Aers (Albuterol) .... Inhale 2 puff using inhaler every four hours 2)  Glipizide 10 Mg Tabs (Glipizide) ....  Take one tablet by mouth two times a day 3)  Albuterol Sulfate (2.5 Mg/84ml) 0.083% Nebu (Albuterol sulfate) .... Per manufacturer instructions as needed wheezing 4)  Novolog Mix 70/30 70-30 % Susp (Insulin aspart prot & aspart) .... 40 units subcutaneous 30 minutes before 5pm meal and 40 units subcutaneous 30 minutes before 7am meal 5)  Advair Diskus 500-50 Mcg/dose Misc (Fluticasone-salmeterol) .Marland Kitchen.. 1 puff in the morning and 1 puff in the evening 6)  Fluoxetine Hcl 40 Mg Caps (Fluoxetine hcl) .... One tablet by mouth daily 7)  Metformin Hcl 1000 Mg Tabs (Metformin hcl) .Marland Kitchen.. 1 tablet by mouth two times a day 8)  Simvastatin 40 Mg Tabs (Simvastatin) .... One tablet by mouth at bedtime 9)  Anacin 81 Mg Tbec (Aspirin) .... Take one tablet by mouth daily 10)  Promethazine Hcl 12.5 Mg Tabs (Promethazine hcl) .Marland Kitchen.. 1-2 tablets by mouth every 6-8 hours as needed for nausea 11)  Fluconazole 150 Mg Tabs (Fluconazole) .... One tablet by mouth once 12)  Miralax Powd (Polyethylene glycol 3350) .Marland Kitchen.. 17g dissolved in 8oz of water, twice a day for three days. thereafter 17g daily in 8oz water.  disp one tub. 13)  Compression Stockings  .... Wear them for lower extremity swelling as needed 14)  Furosemide 20 Mg Tabs (Furosemide) .... One tablet by mouth daily as needed for lower extremity swelling 15)  Ultram 50 Mg Tabs (Tramadol hcl) .... One tablet by mouth every 6 hours as needed for breakthrough pain   Patient Instructions: 1)  Please schedule a follow-up appointment in 2 months with Dr. Burnadette Pop. 2)  Please schedule an appt with Dr. Raymondo Band at his earliest diabetes clinic to assess diabetes. 3)  Stop the gabapentin. 4)  Restart the fluoxetine. 5)  Continue to exercise daily.  Go get a eliptical machine, or indoor bicycle, or treadmill. 6)  Reduce the stress in your life, slow down your breathing, and check your blood sugar if you think you're going to pass out.   Prescriptions: ULTRAM 50 MG  TABS (TRAMADOL HCL) one tablet by mouth every 6 hours as needed for breakthrough pain  #25 x 0   Entered and Authorized by:   [name]  MD   Signed by:   [name]  MD on 06/16/2008   Method used:   Print then Give to Patient   RxID:   [id number]  ]

## 2010-08-12 NOTE — Miscellaneous (Signed)
Summary: CP   Clinical Lists Changes started "sharp "CP & pain to L arm at 8:20 this am. vomiting. no relief. sweaty. jaw pain. pain & symptoms have continued. nothing makes it better. told her to call 911 now for EMS transport. told her to NOT drive herself & why. ahe agreed with plan. asked her to call me later & let me know hw she feels & to make a f/u appt with her md..Sally Elijah Birk RN  Dec 04, 2009 10:23 AM
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
## 2010-08-12 NOTE — Assessment & Plan Note (Signed)
Summary: Diabetic Neuropathy   Vital Signs:  Patient profile:   40 year old female Height:      66.25 inches Weight:      212.8 pounds BMI:     34.21 Temp:     98.1 degrees F oral Pulse rate:   108 / minute BP sitting:   135 / 80  (left arm) Cuff size:   regular  Vitals Entered By: Jimmy Footman, CMA (June 08, 2010 10:58 AM) CC: chronic leg & feet pain Is Patient Diabetic? Yes Did you bring your meter with you today? No Pain Assessment Patient in pain? yes     Location: leg & feet Intensity: 8 Type: burning   Primary Care Letrice Pollok:  Jamie Brookes MD  CC:  chronic leg & feet pain.  History of Present Illness: Diabetic Neuropathy: Pt has chronic lower leg and feet burning and tingling. She says that she is getting scared because she steps on stuff and doesn't even know she has done it. Her mother had bilateral AKA's and her brother had very bad open ulcers on his legs from the neuropathy. She has had CBg's that are in the 500's (it was 520 this morning). She has a pair of diabetic shoes but does not wear them all the time. She says she has been eating a lot of dear meat recently and thinks that has helped her lose some weight.    Habits & Providers  Alcohol-Tobacco-Diet     Tobacco Status: current     Cigarette Packs/Day: 0.5  Current Medications (verified): 1)  Ventolin Hfa 108 (90 Base) Mcg/act Aers (Albuterol Sulfate) .... 2 Puffs As Needed Shortness of Breath. 2)  Advair Diskus 500-50 Mcg/dose  Misc (Fluticasone-Salmeterol) .Marland Kitchen.. 1 Puff in The Morning and 1 Puff in The Evening 3)  Fluoxetine Hcl 40 Mg Caps (Fluoxetine Hcl) .Marland Kitchen.. 1 Tablet By Mouth Daily 4)  Crestor 20 Mg Tabs (Rosuvastatin Calcium) .... One Tablet By Mouth Daily For High Cholesterol 5)  Anacin 81 Mg  Tbec (Aspirin) .... Take One Tablet By Mouth Daily 6)  Compression Stockings .... Wear Them For Lower Extremity Swelling As Needed 7)  Prilosec 20 Mg Cpdr (Omeprazole) .... Take One Tablet Each  Morning 8)  Clonazepam 0.5 Mg Tabs (Clonazepam) .Marland Kitchen.. 1 Tab Two Times A Day Scheduled.  One Tab Mid-Day As Needed Per Dr. Tiajuana Amass. 9)  Lamictal 25 Mg Tabs (Lamotrigine) .... Titration Up To 200mg  10)  Gabapentin 300 Mg Caps (Gabapentin) .Marland Kitchen.. 1 Tab By Mouth Daily Qam For 4 Days, Then Twice A Day For 1 Week, Then 2 Pills in The Morning and 1 At Night For 2 Weeks. 11)  Onetouch Ultra Test  Strp (Glucose Blood) .... Supply Qs For Three Times Daily Test For Patient Taking Basal and Bolus Insulin. 12)  Onetouch Delica Lancets  Misc (Lancets) .... Supply Qs For Three Times Daily Testing. 13)  Albuterol Sulfate (2.5 Mg/4ml) 0.083% Nebu (Albuterol Sulfate) .... Dispense Qs For Once Daily Use. 14)  Tramadol Hcl 50 Mg Tabs (Tramadol Hcl) .... Two Times A Day For Pain 15)  Lantus 100 Unit/ml Soln (Insulin Glargine) .... Inject 50 Units Twice Daily and Increase As Instructed. Give Qs For 1 Month 16)  Novolog 100 Unit/ml Soln (Insulin Aspart) .... Take 15 Units Prior To Each Meal.  Increase To 20 Units Prior To Each Meal. Give Qs For 1 Month  Allergies (verified): 1)  ! Penicillin 2)  ! Singulair (Montelukast Sodium) 3)  ! Morphine Sulfate (Morphine  Sulfate)  Review of Systems        vitals reviewed and pertinent negatives and positives seen in HPI   Physical Exam  General:  Well-developed,well-nourished,in no acute distress; alert,appropriate and cooperative throughout examination  Diabetes Management Exam:    Foot Exam (with socks and/or shoes not present):       Sensory-Pinprick/Light touch:          Left medial foot (L-4): absent          Left dorsal foot (L-5): absent          Left lateral foot (S-1): absent          Right medial foot (L-4): absent          Right dorsal foot (L-5): absent          Right lateral foot (S-1): absent       Sensory-Monofilament:          Left foot: absent          Right foot: absent       Inspection:          Left foot: normal          Right foot:  normal       Nails:          Left foot: normal          Right foot: normal   Impression & Recommendations:  Problem # 1:  DIABETIC PERIPHERAL NEUROPATHY (ICD-250.60) Pt came in with c/o burning and tingling in her feet. She has tried Gabapentin in the past but never got up to a very high dose. Plan to start low and move her up until we get some results. Plan to start seeing her more often in order to get better control. Pt has lost a little weight which is good. Her CBG's are out of control and we discussed what she can do to get better control. Given DM2 logbook today. Plan to see her in 2 weeks.   The following medications were removed from the medication list:    Januvia 100 Mg Tabs (Sitagliptin phosphate) .Marland Kitchen... 1 tablet by mouth daily for diabetes Her updated medication list for this problem includes:    Anacin 81 Mg Tbec (Aspirin) .Marland Kitchen... Take one tablet by mouth daily    Lantus 100 Unit/ml Soln (Insulin glargine) ..... Inject 50 units twice daily and increase as instructed. give qs for 1 month    Novolog 100 Unit/ml Soln (Insulin aspart) .Marland Kitchen... Take 15 units prior to each meal.  increase to 20 units prior to each meal. give qs for 1 month  Orders: [facility]- Est Level  3 ([address])  Complete Medication List: 1)  Ventolin Hfa 108 (90 Base) Mcg/act Aers (Albuterol sulfate) .... 2 puffs as needed shortness of breath. 2)  Advair Diskus 500-50 Mcg/dose Misc (Fluticasone-salmeterol) .Marland Kitchen.. 1 puff in the morning and 1 puff in the evening 3)  Fluoxetine Hcl 40 Mg Caps (Fluoxetine hcl) .Marland Kitchen.. 1 tablet by mouth daily 4)  Crestor 20 Mg Tabs (Rosuvastatin calcium) .... One tablet by mouth daily for high cholesterol 5)  Anacin 81 Mg Tbec (Aspirin) .... Take one tablet by mouth daily 6)  Compression Stockings  .... Wear them for lower extremity swelling as needed 7)  Prilosec 20 Mg Cpdr (Omeprazole) .... Take one tablet each morning 8)  Clonazepam 0.5 Mg Tabs (Clonazepam) .Marland Kitchen.. 1 tab two times a day scheduled.   one tab mid-day as needed per dr. Lorin Picket cunningham. 9)  Lamictal 25 Mg Tabs (Lamotrigine) .... Titration up to 200mg  10)  Gabapentin 300 Mg Caps (Gabapentin) .Marland Kitchen.. 1 tab by mouth daily qam for 4 days, then twice a day for 1 week, then 2 pills in the morning and 1 at night for 2 weeks. 11)  Onetouch Ultra Test Strp (Glucose blood) .... Supply qs for three times daily test for patient taking basal and bolus insulin. 12)  Onetouch Delica Lancets Misc (Lancets) .... Supply qs for three times daily testing. 13)  Albuterol Sulfate (2.5 Mg/60ml) 0.083% Nebu (Albuterol sulfate) .... Dispense qs for once daily use. 14)  Tramadol Hcl 50 Mg Tabs (Tramadol hcl) .... Two times a day for pain 15)  Lantus 100 Unit/ml Soln (Insulin glargine) .... Inject 50 units twice daily and increase as instructed. give qs for 1 month 16)  Novolog 100 Unit/ml Soln (Insulin aspart) .... Take 15 units prior to each meal.  increase to 20 units prior to each meal. give qs for 1 month  Patient Instructions: 1)  You have Diabetic Neuropathy in your legs/feet. 2)  The only way to improve this is better glucose control. That means: taking meds as written, keeping track of your glucose, eating lots of fresh fruits and vegetables (less fried and fatty foods), and getting some exercise even if that means walking to the end of the driveway and back. Try to walk after you eat to help your body put the glucose in the cells.  3)  I want to see you in 2 weeks. We'll go over your glucose log and probably increase the Gabapentin at that visit.  Prescriptions: NOVOLOG 100 UNIT/ML SOLN (INSULIN ASPART) Take 15 units prior to each meal.  Increase to 20 units prior to each meal. give QS for 1 month  #1 x 5   Entered and Authorized by:   Jamie Brookes MD   Signed by:   Jamie Brookes MD on 06/08/2010   Method used:   Electronically to        [facility]  [place] (retail)       [address]       [place], [place]  [address]       Ph:  [phone] or [phone]       Fax: [phone]   RxID:   [id number] LANTUS 100 UNIT/ML SOLN (INSULIN GLARGINE) Inject 50 units twice daily and increase as instructed. give QS for 1 month  #1 x 5   Entered and Authorized by:   Jamie Brookes MD   Signed by:   Jamie Brookes MD on 06/08/2010   Method used:   Electronically to        [facility]  [place] (retail)       [address]       [place], [place]  [address]       Ph: [phone] or [phone]       Fax: [phone]   RxID:   [id number] TRAMADOL HCL 50 MG TABS (TRAMADOL HCL) two times a day for pain  #60 x 5   Entered and Authorized by:   Jamie Brookes MD   Signed by:   Jamie Brookes MD on 06/08/2010   Method used:   [facility]        [facility]  [place] (retail)       [address]       [place], [place]  [address]       Ph: [phone] or [phone]  Fax: [phone]   RxID:   [id number] GABAPENTIN 300 MG CAPS (GABAPENTIN) 1 tab by mouth daily QAM for 4 days, then twice a day for 1 week, then 2 pills in the morning and 1 at night for 2 weeks.  #90 x 3   Entered and Authorized by:   Jamie Brookes MD   Signed by:   Jamie Brookes MD on 06/08/2010   Method used:   [facility]        [facility]  [place] (retail)       [address]       [place], [place]  [address]       Ph: [phone] or [phone]       Fax: [phone]   RxID:   [id number] ANACIN 81 MG  TBEC (ASPIRIN) take one tablet by mouth daily  #90 x 3   Entered and Authorized by:   Jamie Brookes MD   Signed by:   Jamie Brookes MD on 06/08/2010   Method used:   [facility]        [facility]  [place] (retail)       [address]       [place], [place]  [address]       Ph: [phone] or [phone]       Fax: [phone]   RxID:   [id number] CRESTOR 20 MG TABS (ROSUVASTATIN CALCIUM) one tablet by mouth daily for high cholesterol  #31 x 11   Entered and Authorized  by:   Jamie Brookes MD   Signed by:   Jamie Brookes MD on 06/08/2010   Method used:   [facility]        [facility]  [id number] (retail)       [address]       [place], [place]  [address]       Ph: [phone] or [phone]       Fax: [phone]   RxID:   [id number]    Orders Added: 1)  [facility]- Est Level  3 [[address]]     Prevention & Chronic Care Immunizations   Influenza vaccine: Fluvax Non-MCR  (04/30/2009)   Influenza vaccine due: 03/28/2009    Tetanus booster: Not documented    Pneumococcal vaccine: Not documented  Other Screening   Pap smear: Not documented    Mammogram: Not documented   Smoking status: current  (06/08/2010)   Smoking cessation counseling: yes  (08/07/2008)  Diabetes Mellitus   HgbA1C: 11.5  (03/19/2010)   Hemoglobin A1C due: 11/30/2007    Eye exam: Not documented    Foot exam: yes  (06/08/2010)   High risk foot: Not documented   Foot care education: Not documented   Foot exam due: 10/29/2008    Urine microalbumin/creatinine ratio: Not documented    Diabetes flowsheet reviewed?: Yes   Progress toward A1C goal: Deteriorated  Lipids   Total Cholesterol: 217  (09/02/2007)   LDL: See Comment mg/dL  (78/24/2353)   LDL Direct: 145  (04/30/2009)   HDL: 32  (09/02/2007)   Triglycerides: 462  (09/02/2007)    SGOT (AST): 8  (03/02/2010)   SGPT (ALT): 8  (03/02/2010)   Alkaline phosphatase: 104  (03/02/2010)   Total bilirubin: 0.3  (03/02/2010)    Lipid flowsheet reviewed?: Yes   Progress toward LDL goal: Unchanged  Self-Management Support :   Personal Goals (by the next clinic visit) :     Personal A1C goal: 8  (04/30/2009)     Personal blood  pressure goal: 130/80  (03/12/2010)     Personal LDL goal: 100  (04/30/2009)    Patient will work on the following items until the next clinic visit to reach self-care goals:     Medications and monitoring: take my medicines every day, check my blood sugar, bring all of my  medications to every visit, examine my feet every day  (06/08/2010)     Eating: eat more vegetables, use fresh or frozen vegetables, eat baked foods instead of fried foods  (06/08/2010)     Activity: take a 30 minute walk every day  (06/08/2010)    Diabetes self-management support: Written self-care plan  (06/08/2010)   Diabetes care plan printed    Lipid self-management support: Written self-care plan  (06/08/2010)   Lipid self-care plan printed.

## 2010-08-12 NOTE — Miscellaneous (Signed)
Summary: [facility]   Clinical Lists Changes

## 2010-08-12 NOTE — Assessment & Plan Note (Signed)
Summary: Diabetes / Tobacco - Rx Clinic   Vital Signs:  Patient Profile:   40 Years Old Female Height:     66.25 inches Weight:      225 pounds BMI:     36.17 Pulse rate:   104 / minute BP sitting:   134 / 86  (left arm)                  PCP:  [name]  MD   History of Present Illness: Patient comes to office visit today with her husband "[name]".  She reports high levels of stress related to dealing with her two adopted sons who both have schizophrenia.  She states that her stress with dealing with interpersonal issues causes her to have high blood sugars.   She reports 10- episodes of "passing out" during the last 3 weeks.  Deferred this to primary doctor in clinic today for additonal work-up.   See note from Dr. Burnadette Pop.   Diabetes Management History:      She states that she is not following the diet appropriately.  She is checking home blood sugars.   Reports home blood glucose running consistently >200 and as high as 600.  She did not bring blood glucose meter OR log book to this visit.     Current Allergies (reviewed today): ! PENICILLIN ! * SINGULAIR        Impression & Recommendations:  Problem # 1:  DM W/NEURO MNFST, TYPE II, UNCONTROLLED (ICD-250.62) Assessment: Unchanged Poorly controlled diabetes with an insulin requiring patient who does not have any improvement with glycemic control with addition of glipizide or metformin.  The diarrhea experience after taking metformin necessitates stopping at this time.   Patient is willing to increase dose of 70/30 insulin to 50 units twice daily and will attempt to measure blood sugars 2-3 times per day piror to return for reevaluation in 2-3 weeks in Rx Clinic.  At that time we will consider changing back to Lantus based regimen with a meal time dose and possibly a sliding scale.  Her updated medication list for this problem includes:    Glipizide 10 Mg Tabs (Glipizide) .Marland Kitchen... Take one tablet by  mouth two times a day    Novolog Mix 70/30 70-30 % Susp (Insulin aspart prot & aspart) .Marland KitchenMarland KitchenMarland KitchenMarland Kitchen 40 units subcutaneous 30 minutes before 5pm meal and 40 units subcutaneous 30 minutes before 7am meal    Metformin Hcl 1000 Mg Tabs (Metformin hcl) .Marland Kitchen... 1 tablet by mouth two times a day    Anacin 81 Mg Tbec (Aspirin) .Marland Kitchen... Take one tablet by mouth daily  Orders: Inital Assessment Each - [facility] [phone])   Problem # 2:  TOBACCO DEPENDENCE (ICD-305.1) Assessment: Unchanged longstanding nicotine dependence currently contempalative about quitting smoking in future.  Encouraged consideration for setting quit attempt.  Orders: Inital Assessment Each - [facility] [phone])   Problem # 3:  NEURALGIA (ICD-729.2) reports of lower extremity pain with exertion.  Possibly related to PAD given onset with exertion, longstanding history of diabetes, nicotine abuse and family history in mother of amputation.  Consider PAD evaluation in future if pain persists. Orders: Inital Assessment Each - [facility] [phone])   Complete Medication List: 1)  Albuterol 90 Mcg/act Aers (Albuterol) .... Inhale 2 puff using inhaler every four hours 2)  Albuterol Sulfate (2.5 Mg/21ml) 0.083% Nebu (Albuterol sulfate) .... Per manufacturer instructions as needed wheezing 3)  Novolog Mix 70/30 70-30 % Susp (Insulin aspart prot & aspart) .Marland KitchenMarland KitchenMarland Kitchen  50 units subcutaneous 30 minutes before 5pm meal and 50 units subcutaneous 30 minutes before 7am meal 4)  Advair Diskus 500-50 Mcg/dose Misc (Fluticasone-salmeterol) .Marland Kitchen.. 1 puff in the morning and 1 puff in the evening 5)  Fluoxetine Hcl 40 Mg Caps (Fluoxetine hcl) .... One tablet by mouth daily 6)  Simvastatin 40 Mg Tabs (Simvastatin) .... One tablet by mouth at bedtime 7)  Anacin 81 Mg Tbec (Aspirin) .... Take one tablet by mouth daily 8)  Promethazine Hcl 12.5 Mg Tabs (Promethazine hcl) .Marland Kitchen.. 1-2 tablets by mouth every 6-8 hours as needed for nausea 9)  Miralax Powd (Polyethylene glycol 3350) .Marland Kitchen..  17g dissolved in 8oz of water, twice a day for three days. thereafter 17g daily in 8oz water.  disp one tub. 10)  Compression Stockings  .... Wear them for lower extremity swelling as needed 11)  Furosemide 20 Mg Tabs (Furosemide) .... One tablet by mouth daily as needed for lower extremity swelling 12)  Ultram 50 Mg Tabs (Tramadol hcl) .... One tablet by mouth every 6 hours as needed for breakthrough pain  Diabetes Management Assessment/Plan:      The following lipid goals have been established for the patient: Total cholesterol goal of 200; LDL cholesterol goal of 100; HDL cholesterol goal of 40; Triglyceride goal of 150.      ]

## 2010-08-12 NOTE — Consult Note (Signed)
Summary: Psychiatric Progress Notes  Psychiatric Progress Notes   Imported By: Clydell Hakim 08/06/2009 15:42:12  _____________________________________________________________________  External Attachment:    Type:   Image     Comment:   External Document  Appended Document: Psychiatric Progress Notes Reviewed

## 2010-08-12 NOTE — Assessment & Plan Note (Signed)
Summary: mobility concerns, reflux, uncontrolled DM   Vital Signs:  Patient profile:   40 year old female Height:      66.25 inches Weight:      230.6 pounds BMI:     37.07 Temp:     98.3 degrees F Pulse rate:   89 / minute BP sitting:   146 / 84  (right arm)  Vitals Entered By: Jacki Cones RN (January 29, 2009 9:21 AM) CC: mobility eval and frequent heartburn Pain Assessment Patient in pain? yes     Location: chest- related to heart burn per pt Intensity: 10 Type: burning   Primary Care Shenise Wolgamott:  [name]  MD  CC:  mobility eval and frequent heartburn.  History of Present Illness: 40yo F here to request mobility evaluation for scooter and heartburn  Mobility evaluation: Pt wants a wheelchair, handicap parking, and motorized scooter.  She has not had a documented accident, stroke, or medical evidence thus far that clearly indicates why she is not amublatory.  She has had extensive work ups in the past (neurology & cardiology) that have been negative.  She has refused to see a psychiatrist b/c of a co-pay of $75.    Heartburn: Epigastric pain x several days that radiates upwards following certain meals.  Not currently taking any medication for this.  Not related to exertion.  Diabetes: She never followed up with the diabetes clinic d/t her other medical issues.  CBGs continue to run high despite insulin therapy.  Does not have a consistent diet and denies any hypoglycemic episodes.  Tobacco dependence: Continues to smoke.  Not desiring to quit at this time.  Disposition: Reports that she received disability last month.  Habits & Providers  Alcohol-Tobacco-Diet     Tobacco Status: current     Cigarette Packs/Day: 0.25  Allergies: 1)  ! Penicillin 2)  ! * Singulair 3)  ! * Morphine 4)  Amoxicillin (Amoxicillin) 5)  Amoxicillin (Amoxicillin)  Social History: Packs/Day:  0.25  Review of Systems      See HPI  Physical Exam  General:  Pt accompanied by her  husband.  She is in a wheelchair, oriented to place and person and time.  Recognizes who I am and is able to answer questions appropriately and seems less "altered" than usual; NAD Abdomen:  soft, NT, ND, +BS Psych:  Oriented X3 and memory intact for recent and remote.   No danger to self or others   Impression & Recommendations:  Problem # 1:  ESOPHAGEAL REFLUX (ICD-530.81) Assessment New  Patient received a GI cocktail in the clinic and immediately had relief.  Advised on avoidance of spicy foods and coffee.  Instructed to pick up prilosec OTC for symptom control.   Her updated medication list for this problem includes:    Prilosec 20 Mg Cpdr (Omeprazole) .Marland Kitchen... Take one tablet each morning  Orders: [facility]- Est  Level 4 (81191)  Problem # 2:  ADJUSTMENT DISORDER (ICD-309.9) Assessment: Unchanged  Patient is now requesting examination and evaluation for handicap parking, wheelchair, and motorized scooter.  I have no medical evidence to support this at this time.  I instructed her that in order to complete these request, she needs further evaluation beyond me.  She has agreed now to see the psychiatrist.  I have given her information for Dr. Archer Asa of [facility] [phone].  Also, I have contacted the sports medicine clinic to inquire if they perform mobility evaluations per Dr. Donnetta Hail  recommendations.  Patient will f/u with me once she has seen the psychiatrist.  Orders: [facility]- Est  Level 4 ([id number])  Problem # 3:  DM W/NEURO MNFST, TYPE II, UNCONTROLLED (ICD-250.62) Assessment: Deteriorated  Last A1c 11.7 on 11/24/2008.  Pt is very difficult and noncompliant.  There have been occasions where she has stop taking her insulin therefore I have referred her to our diabetes clinic which she failed to follow up.  I will send her back to be reassessed.  No changes to medications today since I could not see her glucometer b/c she didn't have it.     Her updated  medication list for this problem includes:    Novolog Mix 70/30 70-30 % Susp (Insulin aspart prot & aspart) .Marland KitchenMarland KitchenMarland KitchenMarland Kitchen 50 units subcutaneous 30 minutes before 5pm meal and 55 units subcutaneous 30 minutes before 7am meal    Anacin 81 Mg Tbec (Aspirin) .Marland Kitchen... Take one tablet by mouth daily  Orders: [facility]- Est  Level 4 (95621)  Problem # 4:  TOBACCO DEPENDENCE (ICD-305.1) Assessment: Unchanged  Pt refuses to quit at this time.  Multiple counseling have been performed.    Orders: [facility]- Est  Level 4 (99214)  Complete Medication List: 1)  Albuterol 90 Mcg/act Aers (Albuterol) .... Inhale 2 puff using inhaler every four hours 2)  Albuterol Sulfate (2.5 Mg/73ml) 0.083% Nebu (Albuterol sulfate) .... Per manufacturer instructions as needed wheezing 3)  Novolog Mix 70/30 70-30 % Susp (Insulin aspart prot & aspart) .... 50 units subcutaneous 30 minutes before 5pm meal and 55 units subcutaneous 30 minutes before 7am meal 4)  Advair Diskus 500-50 Mcg/dose Misc (Fluticasone-salmeterol) .Marland Kitchen.. 1 puff in the morning and 1 puff in the evening 5)  Fluoxetine Hcl 40 Mg Caps (Fluoxetine hcl) .... 2 tablets by mouth daily 6)  Simvastatin 40 Mg Tabs (Simvastatin) .... One tablet by mouth at bedtime 7)  Anacin 81 Mg Tbec (Aspirin) .... Take one tablet by mouth daily 8)  Miralax Powd (Polyethylene glycol 3350) .Marland Kitchen.. 17g dissolved in 8oz of water, twice a day for three days. thereafter 17g daily in 8oz water.  disp one tub. 9)  Compression Stockings  .... Wear them for lower extremity swelling as needed 10)  Furosemide 20 Mg Tabs (Furosemide) .... One tablet by mouth daily as needed for lower extremity swelling 11)  Prilosec 20 Mg Cpdr (Omeprazole) .... Take one tablet each morning  Patient Instructions: 1)  Schedule a follow up appt with me after you have seen the psychiatrist. 2)  Call [facility] 3)  Dr. Archer Asa 4)  [phone] 5)  I will contact you about the mobility evaluation. 6)  I  will set you up with follow up with the diabetes clinic

## 2010-08-12 NOTE — Assessment & Plan Note (Signed)
Summary: chest discomfort/wp   Vital Signs:  Patient Profile:   40 Years Old Female Height:     66.25 inches Weight:      225.8 pounds BMI:     36.30 O2 Sat:      95 % Temp:     97.9 degrees F oral Pulse rate:   96 / minute BP sitting:   126 / 73  Pt. in pain?   yes    Location:   chest    Intensity:   10  Vitals Entered By: Garen Grams LPN (October 11, 7844 8:42 AM)                  PCP:  [name]  MD  Chief Complaint:  Chest Pain.  History of Present Illness: 40 yo WF with DM, HTN, Tobacco Abuse, COPD who presents with 2 month hisory of intemittant chest pain.  Pain is substernal, but sharp/stabbing.  Non-radiating.  Not associated with SOB, diaphoresis, or nausea.  Denies fever, chills, increased sputum producion, or increased wheezing.  CP is exacerbated by movement, decreased by lying still.  Admitted in 2/09 for r/o MI with successful r/o (also r/o for PE).  Referred to Cardiology who did Dobutamine Myoview on 10/05/07 (records obtained and reviewed today) which was normal and MSK etiology felt to be most likely.  See scanned report.  Today patient's chest pain is unchanged; however, she is frustrated by its persistance.  She has been taking Ibupofen 600 mg and Tylenol XS withou relief.  NTG and Albuterol not helping either.  Denies frequent heartburn or reflux symptoms.  Admits that she took one of her mom's Vicodin pills which helped.  Of note, patient also repors dry-heaves which started yesterday.  No vomiting.  No fever/chills as above.    Prior Medications Reviewed Using: Patient Recall  Current Allergies (reviewed today): ! PENICILLIN ! * SINGULAIR    Risk Factors:    Review of Systems      See HPI   Physical Exam  General:     Overweight-appearing, no acute distress.  Alert and oriented x 3.  Chest Wall:     Significant tenderness to palpation over sternum. Lungs:     Mild intermitant coarseness bilaterally with prolonged expiratory  phase, but no wheezes or increased work of breathing.  No focal findings.  Heart:     Regular rate and rhythm.  No murmur, rub, or gallop.    Abdomen:     Normoactive bowel sounds.  Soft, non-tender, non-distended.     Impression & Recommendations:  Problem # 1:  CHEST PAIN UNSPECIFIED (ICD-786.50) Assessment: Unchanged Atypical in nature with negative cardiac work-up.  Most likely etiology MSK as tenderness to exam.  Will increase to stronge and longer acting anti-inflammatory medication (Meloxicam).  Advised to avoid other anti-inflammatories except for daiy baby aspirin while taking.  Advised narcotics are no a solution for long-term management of this pain and that we do no prescrib those at acute visits typically.  Stressed importance of f/u with Dr. Burnadette Pop regarding this chronic issue. Orders: [facility]- Est  Level 4 ([id number])   Problem # 2:  NAUSEA (ICD-787.02) Assessment: New Unsure of exact etiology.  No vomiting, fever, chills.  Will give prescription for PHenegan to help sympomatically.  Advised not to drive while taking. Her updated medication list for this problem includes:    Promethazine Hcl 12.5 Mg Tabs (Promethazine hcl) .Marland Kitchen... 1-2 tablets by mouth every 6-8 hours as needed for  nausea  Orders: [facility]- Est  Level 4 ([id number])   Complete Medication List: 1)  Albuterol 90 Mcg/act Aers (Albuterol) .... Inhale 2 puff using inhaler every four hours 2)  Glipizide 10 Mg Tabs (Glipizide) .... Take one tablet by mouth two times a day 3)  Albuterol Sulfate (2.5 Mg/58ml) 0.083% Nebu (Albuterol sulfate) .... Per manufacturer instructions as needed wheezing 4)  Novolog Mix 70/30 70-30 % Susp (Insulin aspart prot & aspart) .... 40 units subcutaneous 30 minutes before 5pm meal and 30 units subcutaneous 30 minutes before 7am meal 5)  Spiriva Handihaler 18 Mcg Caps (Tiotropium bromide monohydrate) .... One inhalation daily 6)  Advair Diskus 500-50 Mcg/dose Misc (Fluticasone-salmeterol) .Marland Kitchen.. 1  puff in the morning and 1 puff in the evening 7)  Fluoxetine Hcl 10 Mg Caps (Fluoxetine hcl) .... One tablet at 5pm daily 8)  Chantix Starting Month Pak 0.5 Mg X 11 & 1 Mg X 42 Misc (Varenicline tartrate) .... Per manufacturer instructions 9)  Chantix Continuing Month Pak 1 Mg Tabs (Varenicline tartrate) .... Per manufacturer instructions 10)  Metformin Hcl 500 Mg Tabs (Metformin hcl) .... Take two tablets with dinner once daily 11)  Lipitor 40 Mg Tabs (Atorvastatin calcium) .... Take one tablet by mouth in the evening 12)  Nitroquick 0.4 Mg Subl (Nitroglycerin) .... Place one tablet underneath the tongue as needed with chest pain every five minutes until symptoms resolve or a maximum of three times 13)  Anacin 81 Mg Tbec (Aspirin) .... Take one tablet by mouth daily 14)  Meloxicam 15 Mg Tabs (Meloxicam) .Marland Kitchen.. 1 tablet by mouth daily with food 15)  Promethazine Hcl 12.5 Mg Tabs (Promethazine hcl) .Marland Kitchen.. 1-2 tablets by mouth every 6-8 hours as needed for nausea  Other Orders: Pulse Oximetry- [facility] ([id number])   Patient Instructions: 1)  Please schedule a follow-up visit with Dr. Burnadette Pop in the nex 2-4 weeks regarding your chest pain. 2)  Take the Meloxicam daily with food fo at least the next 7 days - longer if needed. 3)  Do not take Aspirin, Advil, Motrin, Aleve, or other anti-inflammatory medications while taking the Meloxicam. 4)  You have also been given a prescription for phenergan to help with your dry heaves. 5)  Tobacco is very bad for your health and your loved ones! You Should stop smoking!. 6)  Stop Smoking Tips: Choose a Quit date. Cut down before the Quit date. decide what you will do as a substitute when you feel the urge to smoke(gum,toothpick,exercise).    Prescriptions: MELOXICAM 15 MG  TABS (MELOXICAM) 1 tablet by mouth daily with food  #30 x 0   Entered and Authorized by:   Drue Dun MD   Signed by:   Drue Dun MD on 10/12/2007   Method used:   Print then Give to Patient    RxID:   [id number] PROMETHAZINE HCL 12.5 MG  TABS (PROMETHAZINE HCL) 1-2 tablets by mouth every 6-8 hours as needed for nausea  #30 x 0   Entered and Authorized by:   Drue Dun MD   Signed by:   Drue Dun MD on 10/12/2007   Method used:   Print then Give to Patient   RxID:   [id number] MELOXICAM 15 MG  TABS (MELOXICAM) 1 tablet by mouth daily with food  #30 x 0   Entered and Authorized by:   Drue Dun MD   Signed by:   Drue Dun MD on 10/12/2007   Method used:   Electronically sent to .[facility] Kitchen[facility]  [address]       [address]       [place], [place]  [address]       Ph: [phone]       Fax: [phone]   RxID:   [id number]  ]

## 2010-08-12 NOTE — Progress Notes (Signed)
Summary: resch   Phone Note Call from Patient   Caller: Patient Summary of Call: pt depends on neighbor to drive her here and neighbor doesn't want to drive in rain - had to resch for 11/29 Initial call taken by: De Nurse,  May 27, 2010 9:23 AM

## 2010-08-12 NOTE — Progress Notes (Signed)
Summary: [place] request  Phone Note Call from Patient Call back at Home Phone [phone]   Reason for Call: Talk to Nurse Summary of Call: Pt is still have experiencing sharp pains in her leg and states that tylenol and ibuprofen isn't helping.  She would like to know what she can do for the pain.   Initial call taken by: Haydee Salter,  March 09, 2007 4:33 PM  Follow-up for Phone Call        [facility] [address] in [place] [phone] still having leg pains. test was negative for clots. wants something stronger than tylenol or ibuprofen called in. explained that md was not here & we closed in 20 minutes. I sent message to md and paged him Follow-up by: Golden Circle RN,  March 09, 2007 4:41 PM  Additional Follow-up for Phone Call Additional follow up Details #1::        Phone Call Completed

## 2010-08-12 NOTE — Progress Notes (Signed)
Summary: Psychiatry referral   Phone Note Outgoing Call   Call placed by: Spero Geralds PsyD,  Dec 03, 2008 3:27 PM Call placed to: Psychiatrists' Offices Summary of Call: Spoke with people at [employer] and [employer].  Gave a quick rundown of the situation (without any identifying information) including a possible differential of somatoform vs. malingering.  Neither one agreed to see her.  Will keep trying. Initial call taken by: Spero Geralds PsyD,  Dec 03, 2008 3:28 PM  Follow-up for Phone Call        Contacted [facility].  Archer Asa is a psychiatrist there and they have others as well.  Phone number is [phone].  They take [employer].  They prefer the patient to call to make the appointment.  I will provide the information to [facility] and also follow-up with Dr. Burnadette Pop.  A referral letter summarizing the tests that have been done would likely be very helpful. Follow-up by: Spero Geralds PsyD,  Dec 04, 2008 9:04 AM      Appended Document: Psychiatry referral Sinclair Grooms at [phone] and gave her the information.  She will call for an appt.  I asked that she let us know when that appt is.

## 2010-08-12 NOTE — Progress Notes (Signed)
Summary: Taqking Rx - but still having symptoms  Phone Note Call from Patient Call back at [phone]   Summary of Call: pt wants to speak with rn about an rx she is taking, pharmacy is filling for wrong dosage. also wants rn to know she is still passing out. Initial call taken by: Haydee Salter,  August 14, 2008 1:43 PM  Follow-up for Phone Call        Left voicemail to return my call. Follow-up by: AMY MARTIN RN,  August 14, 2008 2:59 PM  Additional Follow-up for Phone Call Additional follow up Details #1::        message left to return call. Additional Follow-up by: Theresia Lo RN,  August 15, 2008 10:51 AM    Additional Follow-up for Phone Call Additional follow up Details #2::    pt picked up RX this morning.  Pt states she has taken 1 dose this AM and is still experiencing symptoms of passing out.  Sugar is running in 200's.  Pleast call pt at her mom's house [phone] Follow-up by: Rae Roam,  August 15, 2008 2:05 PM  Additional Follow-up for Phone Call Additional follow up Details #3:: Details for Additional Follow-up Action Taken: spoke with patient . she states she has medication problem straightened out. also she continues with passing out spells 4-5 times a day.  BS running in 200 's. advised to come in today for follow up. she states after last visit she was supposed to keep a record of passing out spells and BS.  has appointment Wed with pharm clinic.Marland Kitchen  offered work in appointment today but she cannot come. appointment scheduled tomorrow for [place] with Dr. Yetta Barre. will check with Dr.Linthavong today about this also. Additional Follow-up by: Theresia Lo RN,  August 18, 2008 8:52 AM  Discussed patient with Larita Fife.  Will have her work her into my schedule on Thursday...Marland KitchenMarland KitchenMarisue Ivan, MD
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
## 2010-08-12 NOTE — Miscellaneous (Signed)
Summary: Disability Determination Services  Disability Determination Services   Imported By: Knox Royalty 01/15/2008 09:44:11  _____________________________________________________________________  External Attachment:    Type:   Image     Comment:   External Document

## 2010-08-12 NOTE — Progress Notes (Signed)
Summary: Triage syncope  Phone Note Call from Patient Call back at Home Phone [phone]   Reason for Call: Talk to Nurse Summary of Call: wants to speak with rn about "still passing out" Initial call taken by: Haydee Salter,  July 21, 2008 10:58 AM  Follow-up for Phone Call        states she passed out 3 times over the weekend. went to [employer]. had a stress test. results not in yet. appt tomorrow with pcp here. asked her to call the cardiologist back to see if she can be seen today with them. states she will Follow-up by: Golden Circle RN,  July 21, 2008 11:08 AM  Additional Follow-up for Phone Call Additional follow up Details #1::        Agree with plan to call Cardiologist.  Will see her today otherwise. Additional Follow-up by: [name]  MD,  July 22, 2008 10:20 AM

## 2010-08-12 NOTE — Consult Note (Signed)
Summary: [facility]  [facility]   Imported By: Clydell Hakim 10/27/2008 14:25:28  _____________________________________________________________________  External Attachment:    Type:   Image     Comment:   External Document  Appended Document: [facility] Reviewed

## 2010-08-12 NOTE — Progress Notes (Signed)
Summary: Disability  Phone Note Call from Patient Call back at [phone]   Summary of Call: Pt needs rx for disability - needs to say "pt needs to file disability."  Pt said we can leave it up front when ready. Initial call taken by: Haydee Salter,  September 04, 2007 11:18 AM  Follow-up for Phone Call        called pt. pt reports, that Dr.Linthavong saw her in the hospital and told her that she needs to apply for disability. told pt, I don't see it in her chart. Per Pt: Dr.Linthavong knows about it. will fwd. message to Dr.L.  Follow-up by: Arlyss Repress CMA,,  September 04, 2007 11:32 AM  Additional Follow-up for Phone Call Additional follow up Details #1::        I called the patient and told her that we do not process her request for disability.  She needs to go to department of social services to apply and they will then contact us to request the appropriate information if needed.  I don't think she is disable.  I think she will be able to work easier once her chronic medical problems including smoking improves.  I have expressed this information to her while inpatient. Additional Follow-up by: [name]  MD,  September 05, 2007 2:09 PM

## 2010-08-12 NOTE — Progress Notes (Signed)
Summary: wi request  Phone Note Call from Patient Call back at Home Phone [phone]   Reason for Call: Talk to Nurse Summary of Call: pt is requesting wi appt, see after hours call note.  Initial call taken by: ERIN LEVAN,  August 27, 2007 10:46 AM  Follow-up for Phone Call        line busy x 2 Follow-up by: Golden Circle RN,  August 27, 2007 10:49 AM  Additional Follow-up for Phone Call Additional follow up Details #1::        she is much better. drinking well. immodium worked. now has irritated vagina. appt made for am Additional Follow-up by: Golden Circle RN,  August 27, 2007 11:06 AM

## 2010-08-12 NOTE — Progress Notes (Signed)
   Phone Note Call from Patient   Caller: Patient Summary of Call: [name] to ED on saturday 8/16, dx'd w/ vertigo.  Still is feeling dizzy. Went to Urgent care on sunday 8/17, and was told she was dehydrated and ordered to drink plenty of fluids.  Hx of diabetes on lantus 44 units daily.  +polyuria and polydypsia.  Sugars 178 this am up to 293 this evening (sugars mostly in 200's today).  Able to tolerate fluids.  No nausea/vomiting.  Recommended increasing lantus by 5 units tonight, f/u w/ pcp tomorrow as previously scheduled. Will likely need furthur titration of lantus, rule out any infection as cause of elevated blood sugars. Check A1c for assessment of overall diabetic control.   Initial call taken by: Benn Moulder MD,  February 27, 2007 12:50 AM

## 2010-08-12 NOTE — Progress Notes (Signed)
   Phone Note Other Incoming   Reason for Call: Discuss lab or test results Summary of Call: received call from boyfriend and he states patient went to ER on Sat night and was diagnosed with mild stroke. was fine until this AM . now she has left side tingling, slurred speech. had previous headache this AM , talking loud. he checked her BS and result is 301. she seems dazed he reports. advised to take to ER now. he voices understanding. Initial call taken by: Theresia Lo RN,  October 20, 2008 8:56 AM

## 2010-08-12 NOTE — Progress Notes (Signed)
Summary: Triage  Phone Note Call from Patient Call back at Home Phone [phone]   Summary of Call: only able to come to morning appts and needs hospital fu, advised pt that her doctor only has afternoons.  she wants to discuss. Initial call taken by: Haydee Salter,  July 29, 2008 11:39 AM  Follow-up for Phone Call        states she passed out 6 times Friday. states she was told she has uncontrolled dm, stress & low bp. Has appt with Dr. Raymondo Band next Thursday. [place]  [facility] [place] her seen sooner than Feb appt.  heart monitor test was fine per pt. told not to drive by md. pcp not available. scheduled with another white team md for this Friday. she cannot come in 2/1 because she does not have a ride in the [place]. asked her to call heart doctor & have all notes & results of the monitor test faxed here.  also said they told her the prozac dose needed to be increased Follow-up by: Golden Circle RN,  July 29, 2008 11:44 AM  Additional Follow-up for Phone Call Additional follow up Details #1::        Pt seen by Dr. Karn Pickler.  Agree with her plan. Additional Follow-up by: [name]  MD,  August 10, 2008 9:53 PM

## 2010-08-12 NOTE — Assessment & Plan Note (Signed)
Summary: vag infection   Vital Signs:  Patient Profile:   40 Years Old Female Height:     66.25 inches Weight:      231.6 pounds Temp:     97.6 degrees F oral Pulse rate:   93 / minute BP sitting:   119 / 70  Pt. in pain?   no  Vitals Entered By: Renato Battles slade,cma                  PCP:  [name]  MD  Chief Complaint:  diarrhea x 1 week. feels like having yeast infection.  History of Present Illness: 40 yo F with DM c/o diarrhea x1 week now resolved c immodium now having her typical yeast infection symptoms.  ITching, burning with peeing on vulva.  +white curdy discharge.  no fevers.  eating, drinking well.      Current Allergies: ! PENICILLIN ! * SINGULAIR      Physical Exam  General:     Well-developed,well-nourished,in no acute distress; alert,appropriate and cooperative throughout examination.  wearing oxygen Abdomen:     Bowel sounds positive,abdomen soft and non-tender without masses, organomegaly or hernias noted.    Impression & Recommendations:  Problem # 1:  VAGINAL DISCHARGE (ICD-623.5) Assessment: New will tx for yeast, BV.  see pt instrutions.      Orders: [facility]- Est Level  3 (25956)   Problem # 2:  PRURITUS, VAGINAL (ICD-698.1) Assessment: New  Orders: Wet Prep- [facility] (38756) [facility]- Est Level  3 ([id number])   Complete Medication List: 1)  Albuterol 90 Mcg/act Aers (Albuterol) .... Inhale 2 puff using inhaler every four hours 2)  Glipizide 10 Mg Tabs (Glipizide) .... Take one tablet by mouth two times a day 3)  Albuterol Sulfate (2.5 Mg/74ml) 0.083% Nebu (Albuterol sulfate) .... Per manufacturer instructions as needed wheezing 4)  Novolog Mix 70/30 70-30 % Susp (Insulin aspart prot & aspart) .... 40 units subcutaneous 30 minutes before 5pm meal and 25 units subcutaneous 30 minutes before 7am meal 5)  Spiriva Handihaler 18 Mcg Caps (Tiotropium bromide monohydrate) .... One inhalation daily 6)  Advair Diskus 500-50 Mcg/dose Misc  (Fluticasone-salmeterol) .Marland Kitchen.. 1 puff in the morning and 1 puff in the evening 7)  Fluoxetine Hcl 10 Mg Caps (Fluoxetine hcl) .... One tablet at 5pm daily 8)  Chantix Starting Month Pak 0.5 Mg X 11 & 1 Mg X 42 Misc (Varenicline tartrate) .... Per manufacturer instructions 9)  Chantix Continuing Month Pak 1 Mg Tabs (Varenicline tartrate) .... Per manufacturer instructions 10)  Metformin Hcl 500 Mg Tabs (Metformin hcl) .... Take one tablet with dinner once daily 11)  Fenofibrate 160 Mg Tabs (Fenofibrate) .... Take one tablet daily with food 12)  Fluconazole 150 Mg Tabs (Fluconazole) .Marland Kitchen.. 1 by mouth x1 13)  Metronidazole 500 Mg Tabs (Metronidazole) .Marland Kitchen.. 1 by mouth two times a day for 7 days   Patient Instructions: 1)  You have a yeast infection but you also have bacterial vaginitis - this is NOT an STD.  It is an overgrowth of bacteria.  Take Flagyl twice a day for 7 days.  The antibiotic may give you yeast again so you have refills on Diflucan that you can use.  When taking the antibiotic, eat yougurt with live cultures in it (Activia) to prevent you from getting diarrhea again.     Prescriptions: METRONIDAZOLE 500 MG  TABS (METRONIDAZOLE) 1 by mouth two times a day for 7 days  #14 x 0   Entered and  Authorized by:   Rolm Gala MD   Signed by:   Rolm Gala MD on 08/28/2007   Method used:   Electronically sent to ...       [facility]       [address], [address]       [place], [place]  [address]       Ph: [phone]       Fax: [phone]   RxID:   [id number] METRONIDAZOLE 500 MG  TABS (METRONIDAZOLE) 1 by mouth two times a day for 7 days  #14 x 0   Entered and Authorized by:   Rolm Gala MD   Signed by:   Rolm Gala MD on 08/28/2007   Method used:   Print then Give to Patient   RxID:   [id number] FLUCONAZOLE 150 MG  TABS (FLUCONAZOLE) 1 by mouth x1  #1 x 3   Entered and Authorized by:   Rolm Gala MD   Signed by:   Rolm Gala MD on 08/28/2007   Method used:   Print then Give to Patient   RxID:   [id number] FLUCONAZOLE 150 MG  TABS (FLUCONAZOLE) 1 by mouth x1  #1 x 3   Entered and Authorized by:   Rolm Gala MD   Signed by:   Rolm Gala MD on 08/28/2007   Method used:   Electronically sent to ...       [facility] [address]       [address], [address]       [place], [place]  [address]       Ph: [phone]       Fax: [phone]   RxID:   [id number]  ] Laboratory Results  Date/Time Received: August 28, 2007 9:23 AM  Date/Time Reported: August 28, 2007 9:29 AM   Wet [place]/KOH Source: vag WBC/hpf: 5-10 Bacteria/hpf: 3+  rods and cocci Clue cells/hpf: very rare  Negative whiff Yeast/hpf: none Trichomonas/hpf: none Comments: ...............test performed by......Marland KitchenBonnie A. [place], [place] (ASCP)

## 2010-08-12 NOTE — Assessment & Plan Note (Signed)
Summary: r side pain.[name]   Vital Signs:  Patient profile:   40 year old female Height:      66.25 inches Weight:      226 pounds BMI:     36.33 Temp:     99.1 degrees F oral Pulse rate:   103 / minute BP sitting:   126 / 80  (right arm) Cuff size:   regular  Vitals Entered By: Jimmy Footman, CMA (March 02, 2010 3:38 PM) CC: ulcers Is Patient Diabetic? Yes   Primary Care Provider:  Jamie Brookes MD  CC:  ulcers.  History of Present Illness: 40 yo female with hx perinephric abscess in 2009 s/p perc drainage here for flank pain.  Pain has been present 2-3 weeks now, feels like previous perinephric abscess.  Was hospitalized for this in the past and Abscess Cx grew out MSSA.  Tx with Cipro for 14d.  Now with subjective fevers/chills.  No N/V/D/C, no SOB, no CP.  Mild dysuria.  Pain is R CVA.  Sharp.  Very severe.  Current Medications (verified): 1)  Albuterol 90 Mcg/act Aers (Albuterol) .... Inhale 2 Puff Using Inhaler Every Four Hours W/ Spacer Please Disp Spacer As Well 2)  Novolog Mix 70/30 70-30 %  Susp (Insulin Aspart Prot & Aspart) .... 50 Units Subcutaneous 30 Minutes Before 5pm Meal and 55 Units Subcutaneous 30 Minutes Before 7am Meal 3)  Advair Diskus 500-50 Mcg/dose  Misc (Fluticasone-Salmeterol) .Marland Kitchen.. 1 Puff in The Morning and 1 Puff in The Evening 4)  Fluoxetine Hcl 40 Mg Caps (Fluoxetine Hcl) .Marland Kitchen.. 1 Tablet By Mouth Daily 5)  Crestor 20 Mg Tabs (Rosuvastatin Calcium) .... One Tablet By Mouth At Bedtime For High Cholesterol 6)  Anacin 81 Mg  Tbec (Aspirin) .... Take One Tablet By Mouth Daily 7)  Miralax   Powd (Polyethylene Glycol 3350) .Marland Kitchen.. 17g Dissolved in 8oz of Water, Twice A Day For Three Days. Thereafter 17g Daily in [employer].  Disp One Tub. 8)  Compression Stockings .... Wear Them For Lower Extremity Swelling As Needed 9)  Prilosec 20 Mg Cpdr (Omeprazole) .... Take One Tablet Each Morning 10)  Clonazepam 0.5 Mg Tabs (Clonazepam) .Marland Kitchen.. 1 Tab Two Times A Day  Scheduled.  One Tab Mid-Day As Needed Per Dr. Donell Beers (Psych) 11)  Tramadol Hcl 50 Mg Tabs (Tramadol Hcl) .Marland Kitchen.. 1 By Mouth Q 6 Hours As Needed Breakthrough Pain 12)  Januvia 100 Mg Tabs (Sitagliptin Phosphate) .... 1/2 Tablet By Mouth Daily For Diabetes 13)  Lamictal 25 Mg Tabs (Lamotrigine) .... Titration Up To 200mg  14)  Depakote Er 500 Mg Xr24h-Tab (Divalproex Sodium) .... 2 Tablets By Mouth At Bedtime 15)  Gabapentin 300 Mg Caps (Gabapentin) .Marland Kitchen.. 1 Tab By Mouth Daily X3days, 1 Tab Two Times A Day X 3days, Then 1 Tab Three Times A Day 16)  Cipro 750 Mg Tabs (Ciprofloxacin Hcl) .... One Tab By Mouth Two Times A Day X 28 Days 17)  Vicodin 5-500 Mg Tabs (Hydrocodone-Acetaminophen) .... One Tab By Mouth Q4-6h As Needed For Pain.  Allergies (verified): 1)  ! Penicillin 2)  ! * Singulair 3)  ! * Morphine 4)  Amoxicillin (Amoxicillin) 5)  Amoxicillin (Amoxicillin)  Review of Systems       SEE HPI  Physical Exam  General:  Well-developed,well-nourished,in no acute distress; alert,appropriate and cooperative throughout examination Lungs:  Normal respiratory effort, chest expands symmetrically. Lungs are clear to auscultation, no crackles or wheezes. Heart:  Normal rate and regular rhythm. S1 and  S2 normal without gallop, murmur, click, rub or other extra sounds. Abdomen:  Bowel sounds positive,abdomen soft and non-tender without masses, organomegaly or hernias noted.  She does have exquisite R CVAT.  No guarding or rebound tenderness on anterior abdomen.   Impression & Recommendations:  Problem # 1:  PERINEPHRIC ABSCESS (ICD-590.2) Assessment New Symptoms suggestive of recurrence of perinephric abscess.  Guidelines call for prolonged abx for abscesses <5cm, percutaneous drainage for those >5cm.   Will treat with Cipro 750 by mouth two times a day x 28 days  Vicodin for pain. UA, UCx, Upreg CT abd/pelvis with Po/IV contrast CMET Will refer to Urology if >5cm on CT RTC 2  days.  Orders: Comp Met-[facility] [phone]) Urinalysis-[facility] (00000) Urine Culture-[facility] ([id number]) [facility]- Est  Level 4 (84696) CT with Contrast (CT w/ contrast) U Preg-[facility] (29528)  Complete Medication List: 1)  Albuterol 90 Mcg/act Aers (Albuterol) .... Inhale 2 puff using inhaler every four hours w/ spacer please disp spacer as well 2)  Novolog Mix 70/30 70-30 % Susp (Insulin aspart prot & aspart) .... 50 units subcutaneous 30 minutes before 5pm meal and 55 units subcutaneous 30 minutes before 7am meal 3)  Advair Diskus 500-50 Mcg/dose Misc (Fluticasone-salmeterol) .Marland Kitchen.. 1 puff in the morning and 1 puff in the evening 4)  Fluoxetine Hcl 40 Mg Caps (Fluoxetine hcl) .Marland Kitchen.. 1 tablet by mouth daily 5)  Crestor 20 Mg Tabs (Rosuvastatin calcium) .... One tablet by mouth at bedtime for high cholesterol 6)  Anacin 81 Mg Tbec (Aspirin) .... Take one tablet by mouth daily 7)  Miralax Powd (Polyethylene glycol 3350) .Marland Kitchen.. 17g dissolved in 8oz of water, twice a day for three days. thereafter 17g daily in 8oz water.  disp one tub. 8)  Compression Stockings  .... Wear them for lower extremity swelling as needed 9)  Prilosec 20 Mg Cpdr (Omeprazole) .... Take one tablet each morning 10)  Clonazepam 0.5 Mg Tabs (Clonazepam) .Marland Kitchen.. 1 tab two times a day scheduled.  one tab mid-day as needed per dr. Donell Beers (psych) 11)  Tramadol Hcl 50 Mg Tabs (Tramadol hcl) .Marland Kitchen.. 1 by mouth q 6 hours as needed breakthrough pain 12)  Januvia 100 Mg Tabs (Sitagliptin phosphate) .... 1/2 tablet by mouth daily for diabetes 13)  Lamictal 25 Mg Tabs (Lamotrigine) .... Titration up to 200mg  14)  Depakote Er 500 Mg Xr24h-tab (Divalproex sodium) .... 2 tablets by mouth at bedtime 15)  Gabapentin 300 Mg Caps (Gabapentin) .Marland Kitchen.. 1 tab by mouth daily x3days, 1 tab two times a day x 3days, then 1 tab three times a day 16)  Cipro 750 Mg Tabs (Ciprofloxacin hcl) .... One tab by mouth two times a day x 28 days 17)  Vicodin 5-500 Mg Tabs  (Hydrocodone-acetaminophen) .... One tab by mouth q4-6h as needed for pain.  Patient Instructions: 1)  Great to meet you, 2)  I think you may have another kidney abscess. 3)  Will do Cipro for 28 days. 4)  CT scan your abdomen. 5)  Check some bloodwork today. 6)  Come back to see me in 2 days to see how you are doing. 7)  Call the office if you are unable to keep down food or liquids or develop fevers. 8)  -Dr. Karie Schwalbe. Prescriptions: VICODIN 5-500 MG TABS (HYDROCODONE-ACETAMINOPHEN) One tab by mouth q4-6h as needed for pain.  #60 x 0   Entered and Authorized by:   Rodney Langton MD   Signed by:   Rodney Langton MD on 03/02/2010  Method used:   Print then Give to Patient   RxID:   [id number] CIPRO 750 MG TABS (CIPROFLOXACIN HCL) One tab by mouth two times a day x 28 days  #56 x 0   Entered and Authorized by:   Rodney Langton MD   Signed by:   Rodney Langton MD on 03/02/2010   Method used:   Print then Give to Patient   RxID:   [id number]   Appended Document: urine report     Lab Visit  Laboratory Results   Urine Tests  Date/Time Received: March 02, 2010 4:25 PM  Date/Time Reported: March 02, 2010 5:02 PM   Routine Urinalysis   Color: yellow Appearance: Clear Glucose: 500   (Normal Range: Negative) Bilirubin: negative   (Normal Range: Negative) Ketone: small (15)   (Normal Range: Negative) Spec. Gravity: 1.015   (Normal Range: 1.003-1.035) Blood: trace-intact   (Normal Range: Negative) pH: 5.5   (Normal Range: 5.0-8.0) Protein: negative   (Normal Range: Negative) Urobilinogen: 0.2   (Normal Range: 0-1) Nitrite: negative   (Normal Range: Negative) Leukocyte Esterace: negative   (Normal Range: Negative)  Urine Microscopic WBC/HPF: 1-3 Bacteria/HPF: 1+ Epithelial/HPF: 1-5 Yeast/HPF: many    Urine HCG: negative Comments: ...............test performed by......Marland KitchenBonnie A. [place], MLS (ASCP)cm    Orders Today:

## 2010-08-12 NOTE — Progress Notes (Signed)
Summary: lantus called in   Phone Note Call from Patient   Caller: Patient Summary of Call: PT NEEDS LANTUS INSULIN PHONED IN TO HER PHARMACY PLEASE CALL HER AT [phone] Initial call taken by: Dedra Skeens CMA,,  February 08, 2007 3:12 PM  Follow-up for Phone Call        called pt. pt request her lantus to be called in to [facility] [phone] called in lantus takes 40-44 unitis daily.  pt will sched. appt with her new dr. Burnadette Pop Follow-up by: Arlyss Repress CMA,,  February 09, 2007 11:37 AM

## 2010-08-12 NOTE — Progress Notes (Signed)
Summary: phn msg   Phone Note Call from Patient Call back at [facility] Phone [phone]   Caller: Patient Summary of Call: pt does have a app with a pyschristrist, but they said they could not do evals for a scooter.  Scooter store is telling her Dr. Burnadette Pop is quailified to sign for her to get one.  She is confused.  Initial call taken by: Clydell Hakim,  February 06, 2009 11:04 AM  Follow-up for Phone Call        Pt states she saw the psychiatrist and will make a f/u appt with Dr Burnadette Pop per ov notes. Advised she has no medical need for a scooter. She will discuss this at the next ov. Follow-up by: Alphia Kava,  February 06, 2009 11:59 AM

## 2010-08-12 NOTE — Assessment & Plan Note (Signed)
Summary: cbg in 400s. not taking glipizide due to weight gain   Vital Signs:  Patient Profile:   40 Years Old Female Height:     66.25 inches Weight:      230 pounds BMI:     36.98 Temp:     97.3 degrees F Pulse rate:   93 / minute BP sitting:   130 / 83  Pt. in pain?   yes    Location:   l leg    Intensity:   8  Vitals Entered By: Golden Circle RN (May 16, 2007 8:35 AM)                  PCP:  [name]  MD  Chief Complaint:  cbg in 400s for past few weeks. not taking glipizide.  History of Present Illness: [name] is a 40yo WF h/o uncontrolled DM II that returns for f/u after being started on Glipizide 5mg  two times a day.   Had been doing well - decreased polyuria, tiredness.  But has been gaining weight with it.  Recently saw DM management she says (we got noticed she [facility]) and discussed diet principals.  She knows what she should be eating.  Metformin gave her GI side effects.  Want something else besides Glipizide.  sugars 400s inam.  Has increased Lantus to 60 units two times a day.  Also c/o vaginal itching, discharge - feels like she felt when last had yeast infection.    still smoking 1/2 ppd.  Current Allergies: No known allergies       Physical Exam  General:     Well-developed,well-nourished,in no acute distress; alert,appropriate and cooperative throughout examination.  obese.    Impression & Recommendations:  Problem # 1:  DM W/NEURO MNFST, TYPE II, UNCONTROLLED (ICD-250.62) Assessment: Deteriorated see pt instructions. Her updated medication list for this problem includes:    Lantus Solostar 100 Unit/ml Soln (Insulin glargine) .Marland Kitchen... Take 35 units once in the morning and 35 once in the evening - subcutaneously    Glipizide 10 Mg Tabs (Glipizide) .Marland Kitchen... Take one tablet by mouth two times a day    Januvia 100 Mg Tabs (Sitagliptin phosphate) .Marland Kitchen... 1 by mouth daily  Orders: [facility]- Est  Level 4 (99214)   Problem # 2:  CANDIDIASIS  OF VULVA AND VAGINA (ICD-112.1) Assessment: New see pt instructions Her updated medication list for this problem includes:    Diflucan 150 Mg Tabs (Fluconazole) .Marland Kitchen... 1 by mouth x1  Orders: [facility]- Est  Level 4 (88416)   Problem # 3:  TOBACCO DEPENDENCE (ICD-305.1) Assessment: Unchanged advised to quit. Orders: [facility]- Est  Level 4 (99214)   Complete Medication List: 1)  Albuterol 90 Mcg/act Aers (Albuterol) .... Inhale 2 puff using inhaler every four hours 2)  Advair Diskus 500-50 Mcg/dose Misc (Fluticasone-salmeterol) .Marland Kitchen.. 1 puff as directed twice a day 3)  Lantus Solostar 100 Unit/ml Soln (Insulin glargine) .... Take 35 units once in the morning and 35 once in the evening - subcutaneously 4)  Glipizide 10 Mg Tabs (Glipizide) .... Take one tablet by mouth two times a day 5)  Albuterol Sulfate (2.5 Mg/79ml) 0.083% Nebu (Albuterol sulfate) .... Per manufacturer instructions as needed wheezing 6)  Januvia 100 Mg Tabs (Sitagliptin phosphate) .Marland Kitchen.. 1 by mouth daily 7)  Diflucan 150 Mg Tabs (Fluconazole) .Marland Kitchen.. 1 by mouth x1   Patient Instructions: 1)  today take 60 units of lantus in the evening.   2)  tomarrow: take [place] once  daily.  then change your lantus to 40 units twice a day.  after 3 days, if sugars are still a little high, increase lantus.  increase it by 2 units daily - either 2 in the am or pm but not both in the same day.  if your fasting blood sugar gets below 100, stop increaseing your lantus.   3)  reasons to come back: any allergic reaction to [place] - rash, throat swelling. 4)  diflucan 150mg  x 1.  if it doesn' work - give it 2-3 days - come back.   5)  f/u with dr. Tye Maryland in 1 month; call clinic in 1 week and tell us how your sugars are doing.    Prescriptions: DIFLUCAN 150 MG  TABS (FLUCONAZOLE) 1 by mouth x1  #1 x 0   Entered and Authorized by:   Rolm Gala MD   Signed by:   Rolm Gala MD on 05/16/2007   Method used:   Print then Give to Patient   RxID:    [id number] JANUVIA 100 MG  TABS (SITAGLIPTIN PHOSPHATE) 1 by mouth daily  #90 x 3   Entered and Authorized by:   Rolm Gala MD   Signed by:   Rolm Gala MD on 05/16/2007   Method used:   Print then Give to Patient   RxID:   [id number] DIFLUCAN 150 MG  TABS (FLUCONAZOLE) 1 by mouth x1  #1 x 0   Entered and Authorized by:   Rolm Gala MD   Signed by:   Rolm Gala MD on 05/16/2007   Method used:   Electronically sent to ...       [facility]  [address]       [address], [address]       [place], [place]  [address]       Ph: [phone]       Fax: [phone]   RxID:   [id number] JANUVIA 100 MG  TABS (SITAGLIPTIN PHOSPHATE) 1 by mouth daily  #90 x 3   Entered and Authorized by:   Rolm Gala MD   Signed by:   Rolm Gala MD on 05/16/2007   Method used:   Electronically sent to ...       [facility]  [address]       [address]       [place], [place]  [address]       Ph: [phone]       Fax: [phone]   RxID:   [id number]  ]

## 2010-08-12 NOTE — Progress Notes (Signed)
Summary: Pt called to cancel appt   Phone Note Call from Patient   Caller: Patient Summary of Call: pt called to call cxl appt for today- she has a cold and doesn't feel good. Initial call taken by: De Nurse,  March 30, 2010 8:33 AM  Follow-up for Phone Call        noted.  Follow-up by: Jamie Brookes MD,  March 30, 2010 10:24 PM

## 2010-08-12 NOTE — Progress Notes (Signed)
Summary: truiage  Phone Note Call from Patient Call back at [phone]   Caller: Patient Summary of Call: she thinks she needs antibiotic due to boil that burst yesterday it might be mersa Initial call taken by: De Nurse,  October 02, 2008 2:01 PM  Follow-up for Phone Call        had boil under r arm. burst yesterday when she was at another md. they told her she needed an antibiotic. they told her it was MRSA. appt made for tomorrow am. advised warm wet cloths to area frequently & tylenol or ibuprofen for the pain. states she is already doing both of these things.   Follow-up by: Golden Circle RN,  October 02, 2008 2:12 PM  Additional Follow-up for Phone Call Additional follow up Details #1::        agree that she should be seen in the office  and treated accordingly Additional Follow-up by: [name]  MD,  October 02, 2008 10:29 PM

## 2010-08-12 NOTE — Consult Note (Signed)
Summary: [facility]   Imported By: Bradly Bienenstock 10/17/2008 16:42:49  _____________________________________________________________________  External Attachment:    Type:   Image     Comment:   External Document  Appended Document: [facility] reviewed

## 2010-08-12 NOTE — Miscellaneous (Signed)
  Clinical Lists Changes  Observations: Added new observation of NUCST CONC: Performed at [facility].  Technetium 44m Tetrofosmin.  Exercise Time 7:00 min.  Pt stopped due to fatigue & chest tightness.  No significant ST-T wave changes.  Max HR 157 (86% max HR). Max SBP 185.  EF- 61%.  Overall Impression: Nl stress nuclear study.  Very mild fixed anterior defect that is most c/w breast attenuation. (10/05/2007 14:37)       Nuclear ETT  Procedure date:  10/05/2007  Findings:      Performed at [facility].  Technetium 44m Tetrofosmin.  Exercise Time 7:00 min.  Pt stopped due to fatigue & chest tightness.  No significant ST-T wave changes.  Max HR 157 (86% max HR). Max SBP 185.  EF- 61%.  Overall Impression: Nl stress nuclear study.  Very mild fixed anterior defect that is most c/w breast attenuation.
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
## 2010-08-12 NOTE — Assessment & Plan Note (Signed)
Summary: Behavioral Medicine   History of Present Illness: [name] reports things are pretty much the same.  The stress remains with her stepson and her headaches.  She has had periods of "normal" behavior but does not recall them afterward.  She also reports increased forgetfulness and cites an example of not remembering whether she took her insulin.  After speaking with [name] I asked [name] to come in and asked him the same questions to determine the degree of consistency and his overall impression.  Allergies: 1)  ! Penicillin 2)  ! * Singulair 3)  ! * Morphine 4)  Amoxicillin (Amoxicillin) 5)  Amoxicillin (Amoxicillin)   Impression & Recommendations:  Problem # 1:  ALTERED MENTAL STATUS (ICD-780.97) I expressed concern about [name]'s behaviors - the fact that they don't really add up from a neurological or psychatric perspective.  We touched on the issue of people changing their behaviors in order to get what they think they need.  We also reviewed the idea that sometimes stress can be so severe that it causes a change in behavior that no one fully understands.  She has a $30 copay to see me.  If frequent visits help keep her out of the hospital, then this is likely a fair tradeoff.  It does present an additional financial hardship though and that contributes to the overall stress.  Will discuss with Dr. Burnadette Pop and call [facility] to discuss further.  Orders: Therapy 40-50- min- [facility] ([address]109)  Problem # 2:  ADJUSTMENT DISORDER (ICD-309.9) Stressors are chronic in nature:  family relationships, finances, headaches.  Coping resources are limited.  Ability to develop alternative coping mechanisms (exercise, scheduling pleasant activities, cognitive restructuring) is low.  Orders: Therapy 40-50- min- [facility] (60454)  Complete Medication List: 1)  Albuterol 90 Mcg/act Aers (Albuterol) .... Inhale 2 puff using inhaler every four hours 2)  Albuterol Sulfate (2.5 Mg/19ml) 0.083% Nebu  (Albuterol sulfate) .... Per manufacturer instructions as needed wheezing 3)  Novolog Mix 70/30 70-30 % Susp (Insulin aspart prot & aspart) .... 50 units subcutaneous 30 minutes before 5pm meal and 55 units subcutaneous 30 minutes before 7am meal 4)  Advair Diskus 500-50 Mcg/dose Misc (Fluticasone-salmeterol) .Marland Kitchen.. 1 puff in the morning and 1 puff in the evening 5)  Fluoxetine Hcl 40 Mg Caps (Fluoxetine hcl) .... 2 tablets by mouth daily 6)  Simvastatin 40 Mg Tabs (Simvastatin) .... One tablet by mouth at bedtime 7)  Anacin 81 Mg Tbec (Aspirin) .... Take one tablet by mouth daily 8)  Miralax Powd (Polyethylene glycol 3350) .Marland Kitchen.. 17g dissolved in 8oz of water, twice a day for three days. thereafter 17g daily in 8oz water.  disp one tub. 9)  Compression Stockings  .... Wear them for lower extremity swelling as needed 10)  Furosemide 20 Mg Tabs (Furosemide) .... One tablet by mouth daily as needed for lower extremity swelling 11)  Ultram 50 Mg Tabs (Tramadol hcl) .... One tablet by mouth every 6 hours as needed for breakthrough pain 12)  Ketorolac Tromethamine 10 Mg Tabs (Ketorolac tromethamine) .... One tablet every 6 hours as needed for severe headache

## 2010-08-12 NOTE — Assessment & Plan Note (Signed)
Summary: f/u blood sugar per [facility]    PCP:  [name]  MD   History of Present Illness: [name] is 40 yo WF that is here for a f/u after being seen last week for headaches, dizziness, and polyuria 2/2 elevated blood sugars.  She was instructed to increase her lantus (46units) by 2 units each day until her morning blood sugars range from 90 to 120.  She states that she increased it to 50 units and became worried that it may be too much lantus.  Her morning blood sugars continue to be elevated in the upper 200s and low 300s despite a change to low fat diet and increase in lantus.  Her symptoms have continued to persist.  She also complains of pain behind the left knee that has been going on for several days.  She reports that it feels similar to a DVT she had previously.  She reports some paresthesia and pain in her feet at night.  She reports some edema of the left leg as well.  She denies any tachypnea, dyspnea, or chest pain.     Current Allergies: No known allergies   Past Medical History:    1.  Diabetes mellitus, type II    2.  h/o DVT    3.  Asthma    4.  Tobacco abuse    5.  COPD    6.     Social History:    lives with husban and sons, unemployed due to asthma; smokes 1 ppd    Review of Systems  The patient denies fever, weight loss, syncope, dyspnea on exhertion, and prolonged cough.     Physical Exam  General:     Well-developed,well-nourished,in no acute distress; alert,appropriate and cooperative throughout examination Head:     normocephalic, atraumatic, and no abnormalities observed.  normocephalic, atraumatic, and no abnormalities observed.   Eyes:     vision grossly intact.   Neck:     supple and full ROM.   Lungs:     Diffuse wheezing throughout all lung fields Heart:     Normal rate and regular rhythm. S1 and S2 normal without gallop, murmur, click, rub or other extra sounds. Abdomen:     Bowel sounds positive,abdomen soft and  non-tender without masses, organomegaly or hernias noted. Extremities:     no edema in lower extremities.  2+ pedal pulses bilaterally.  No warmth or tenderness to deep palpation of either extremity.  No discoloration of the lower leg bilaterally.  Lower thigh measurements were equal (18.5 in).  Upper calf measurements were equal (15.5 in). Psych:     Cognition and judgment appear intact. Alert and cooperative with normal attention span and concentration. No apparent delusions, illusions, hallucinations  Diabetes Management Exam:    Foot Exam (with socks and/or shoes not present):       Sensory-Pinprick/Light touch:          Left medial foot (L-4): normal          Left dorsal foot (L-5): normal          Left lateral foot (S-1): normal          Right medial foot (L-4): normal          Right dorsal foot (L-5): normal          Right lateral foot (S-1): normal       Sensory-Monofilament:          Left foot: normal  Right foot: normal       Inspection:          Left foot: normal          Right foot: normal       Nails:          Left foot: normal          Right foot: normal    Impression & Recommendations:  Problem # 1:  DM W/MANIFESTATION NEC, TYPE II, UNCONTROLLED (ICD-250.82) Discussed with Blima that it was appropriate to increase her lantus and that it was necessary to control her blood sugars.  Consulted pharmacy about the increase and Dr. Raymondo Band agreed.  Provided her with lantus pens and called a prescription to her pharmacy for needles. Our plan was to increase her lantus each day by 1 unit until her morning blood sugars are between 90 and 120.  She will increase her daily physical activity by walking for 30 minutes every day.  She will continue with her low fat diet and f/u in 1 week with a goal of all her morning sugars below 300.  I've explained to her that the majority of her symptoms are due to her high blood sugars.   Her updated medication list for this problem  includes:    Lantus 100 Unit/ml Soln (Insulin glargine) ..... Start at 44units at bedtime and increase by 2 units until morning goal of 90-120 is reached  Orders: [facility]- Est Level  3 (16109)   Problem # 2:  LEG PAIN, LEFT (ICD-729.5) Due to the fact that she has a history of DVTs and states that her leg pain feels similar to the time that she had her DVTs, I've ordered Lower extremity doppler studies for her on Friday 9am at [facility].  They will call me with the results.   Orders: [facility]- Est Level  3 (60454)   Problem # 3:  ASTHMA, UNSPECIFIED (ICD-493.90) She continues to wheeze and use her albuterol inhaler 3 times a day.  I've placed an order for a new nebulizer apparatus.  I will continue to do smoking cessation instructions and encouragement at every visit.   Her updated medication list for this problem includes:    Albuterol 90 Mcg/act Aers (Albuterol) ..... Inhale 2 puff using inhaler every four hours    Advair Diskus 500-50 Mcg/dose Misc (Fluticasone-salmeterol) .Marland Kitchen... 1 puff as directed twice a day   Medications Added to Medication List This Visit: 1)  Advair Diskus 500-50 Mcg/dose Misc (Fluticasone-salmeterol) .Marland Kitchen.. 1 puff as directed twice a day  Complete Medication List: 1)  Lantus 100 Unit/ml Soln (Insulin glargine) .... Start at 44units at bedtime and increase by 2 units until morning goal of 90-120 is reached 2)  Albuterol 90 Mcg/act Aers (Albuterol) .... Inhale 2 puff using inhaler every four hours 3)  Advair Diskus 500-50 Mcg/dose Misc (Fluticasone-salmeterol) .Marland Kitchen.. 1 puff as directed twice a day   Patient Instructions: 1)  F/U in 1 week 2)  Lantus:  Start at 51 units tonight and go up 1 unit everyday until morning blood sugar is between 90 and 120. 3)  Increase exercise to 30 minutes each day. 4)  Continue low fat diet.   5)  Go to pharmacy for lantus needles for lantus injection pen. 6)  Will obtain appointment for Lower Extremity Dopplers for DVT.

## 2010-08-12 NOTE — Assessment & Plan Note (Signed)
Summary: F/U VISIT [facility]   Vital Signs:  Patient Profile:   40 Years Old Female Weight:      231 pounds Pulse rate:   109 / minute BP sitting:   136 / 93  Vitals Entered By: Lillia Pauls CMA (April 23, 2007 1:42 PM)                 PCP:  [name]  MD  Chief Complaint:  fu.  History of Present Illness: [name] is a 40yo WF h/o uncontrolled DM II that returns for f/u after being started on Glipizide 5mg  two times a day.  She reports that she can truly tell a difference in the way she feels.  She no longer experiencies dizziness, headaches, and polyuria.  Her blood sugars are no longer in the 300s.  They range from 130s-270s dependent upon her remembering to take her medicines.  Despite giving her specific medication instructions, she has chosen to increase her Glipizide to 2 tablets twice a day and her lantus to 40 units over the past week.  She denies any GI side effects although she has gained 9 pounds since her last visit.  She reports being able to walk more frequently now that she feels better.  She reports that she rarely uses her Advair but uses her Albuterol nightly.  She denies wheezing or SOB.    She continues to smoke and does not desire to quit at this time although she acknowledges that she should.  Current Allergies: No known allergies      Review of Systems  The patient denies anorexia, fever, weight loss, vision loss, chest pain, syncope, dyspnea on exhertion, peripheral edema, and abdominal pain.     Physical Exam  General:     Well-developed,well-nourished,in no acute distress; alert,appropriate and cooperative throughout examination Neck:     No deformities, masses, or tenderness noted. Lungs:     Normal respiratory effort, chest expands symmetrically. Lungs are clear to auscultation, no crackles or wheezes. Heart:     Normal rate and regular rhythm. S1 and S2 normal without gallop, murmur, click, rub or other extra sounds. Abdomen:  Bowel sounds positive,abdomen soft and non-tender without masses, organomegaly or hernias noted.  Diabetes Management Exam:    Foot Exam (with socks and/or shoes not present):       Sensory-Monofilament:          Left foot: absent          Right foot: normal       Inspection:          Left foot: normal          Right foot: normal       Nails:          Left foot: thickened          Right foot: thickened    Eye Exam:       Eye Exam not due    Impression & Recommendations:  Problem # 1:  DM W/NEURO MNFST, TYPE II, UNCONTROLLED (ICD-250.62) Her hyperglycemic symptoms seem to have resolved with the addition of the oral medication.  We will double her dosage today in hopes of bringing her level b/w 90-120.  We will check her Cr level to monitor the safety of the Glipizide.  We will also obtain a Hgb A1c to see if her DM II is improving. Continued to encourage patient to lose weight, increase activity, and watch diet.   We will f/u in 6 months.  Her updated medication list for this problem includes:    Lantus Solostar 100 Unit/ml Soln (Insulin glargine) .Marland Kitchen... Take 35 units once in the morning and 35 once in the evening - subcutaneously    Glipizide 10 Mg Tabs (Glipizide) .Marland Kitchen... Take one tablet by mouth two times a day  Orders: Basic Met-[facility] (04540-98119) A1C-[facility] (14782) [facility]- Est  Level 4 (95621)   Problem # 2:  TOBACCO DEPENDENCE (ICD-305.1) Assessment: Unchanged She continues to not want to quit smoking.  Issue was addressed, assessed, advised.   Orders: [facility]- Est  Level 4 (30865)   Problem # 3:  OBESITY NOS (ICD-278.00) She has gained 9 pounds since her last visit.  Part of which may be secondary to the glipizide although the majority of the cause is likely due to minimal physical activity.  Patient has met with the nutrionist and is educated on proper foods and activity.  She has set a goal to lose 50lbs in 6 months.  She reports buying a wt loss tablet from [facility] which I disagreed  with explaining that it may not be regulated by the government which means we might not know the detrimental effects of the substance. Orders: [facility]- Est  Level 4 (78469)   Problem # 4:  ASTHMA, UNSPECIFIED (ICD-493.90) Pt needed education on her meds.  She has not been using her advair as prescribed but has rather used her albuterol as maintenance medication.  I explained to her that the albuterol was a rescue medication and the advair should be used as maintenance medication.  Also advised that smoking is not helping with her asthma.   Her updated medication list for this problem includes:    Albuterol 90 Mcg/act Aers (Albuterol) ..... Inhale 2 puff using inhaler every four hours    Advair Diskus 500-50 Mcg/dose Misc (Fluticasone-salmeterol) .Marland Kitchen... 1 puff as directed twice a day    Albuterol Sulfate (2.5 Mg/84ml) 0.083% Nebu (Albuterol sulfate) .Marland Kitchen... Per manufacturer instructions as needed wheezing  Orders: [facility]- Est  Level 4 (99214)   Complete Medication List: 1)  Albuterol 90 Mcg/act Aers (Albuterol) .... Inhale 2 puff using inhaler every four hours 2)  Advair Diskus 500-50 Mcg/dose Misc (Fluticasone-salmeterol) .Marland Kitchen.. 1 puff as directed twice a day 3)  Lantus Solostar 100 Unit/ml Soln (Insulin glargine) .... Take 35 units once in the morning and 35 once in the evening - subcutaneously 4)  Glipizide 10 Mg Tabs (Glipizide) .... Take one tablet by mouth two times a day 5)  Albuterol Sulfate (2.5 Mg/53ml) 0.083% Nebu (Albuterol sulfate) .... Per manufacturer instructions as needed wheezing   Complete Medication List: 1)  Albuterol 90 Mcg/act Aers (Albuterol) .... Inhale 2 puff using inhaler every four hours 2)  Advair Diskus 500-50 Mcg/dose Misc (Fluticasone-salmeterol) .Marland Kitchen.. 1 puff as directed twice a day 3)  Lantus Solostar 100 Unit/ml Soln (Insulin glargine) .... Take 35 units once in the morning and 35 once in the evening - subcutaneously 4)  Glipizide 10 Mg Tabs (Glipizide) .... Take one  tablet by mouth two times a day 5)  Albuterol Sulfate (2.5 Mg/92ml) 0.083% Nebu (Albuterol sulfate) .... Per manufacturer instructions as needed wheezing   Patient Instructions: 1)  f/u in 6 months 2)  Increase Glipizide dose to 10 mg twice a day 3)  Lantus 40 units twice a day 4)  Advair 1puff twice a day 5)  Albuterol as needed only 6)  Continue to excerise and lose weight (goal: 50lbs in 6 months)    Prescriptions: ALBUTEROL  SULFATE (2.5 MG/3ML) 0.083%  NEBU (ALBUTEROL SULFATE) per manufacturer instructions as needed wheezing  #1 x 1   Entered and Authorized by:   [name]  MD   Signed by:   [name]  MD on 04/23/2007   Method used:   Handwritten   RxID:   [id number] GLIPIZIDE 10 MG  TABS (GLIPIZIDE) Take one tablet by mouth two times a day  #180 x 1   Entered and Authorized by:   [name]  MD   Signed by:   [name]  MD on 04/23/2007   Method used:   Handwritten   RxID:   [id number]  ] Laboratory Results   Blood Tests   Date/Time Received: April 23, 2007 2:14  PM  Date/Time Reported: April 23, 2007 2:29 PM   HGBA1C: 9.4%   (Normal Range: Non-Diabetic - 3-6%   Control Diabetic - 6-8%)  Comments: ...............test performed by......Marland KitchenBonnie A. [place], [place] (ASCP)

## 2010-08-12 NOTE — Progress Notes (Signed)
       21  22  23  24  25   Phone Note Call from Patient Call back at [facility] Phone [phone]   Caller: Mom Reason for Call: Talk to Nurse Summary of Call: MOM, UPSET WHAT CAN SHE DO FOR HER DAUGHTER , PT HAD MILD CVA, D/C [facility] TODAY.Marland Kitchen GENERAL QUESTION / Suezanne Jacquet [phone] Initial call taken by: Burnard Leigh,  October 21, 2008 2:37 PM  Follow-up for Phone Call        see previous phone note Follow-up by: Gypsy Balsam RN BSN,  October 21, 2008 4:15 PM

## 2010-08-12 NOTE — Assessment & Plan Note (Signed)
Summary: pain has not abated   Vital Signs:  Patient Profile:   40 Years Old Female Height:     66.25 inches Weight:      218.7 pounds BMI:     35.16 Temp:     98.6 degrees F Pulse rate:   108 / minute BP sitting:   120 / 80  (left arm)  Pt. in pain?   yes    Location:   RUQ    Intensity:   10  Vitals Entered By: Dedra Skeens CMA, (Nov 21, 2007 1:40 PM)                  PCP:  [name]  MD  Chief Complaint:  RUQ PAIN.  History of Present Illness: HPI Follow Up Complaint (loc, qual, sev, dur, ROS): Patient states no better.  Pain is worse with movement, coughing, unchanged with eating, stooling or voiding.  No skin rash.  No bloating.  She notes two bowel movements in two days. Past history/Family History of this problem:  Poorly controlled diabetes, recurrent vaginal candidiasis.  No history malignancy, herpectic infection, prior abdominal surgery.       Current Allergies: ! PENICILLIN ! * SINGULAIR      Physical Exam  exam is unchanged from prior visit:  Awake, alert, no distress.  Pt is responsive and intelligible.  No icterus, jaundice or JVD.  Heart sounds are regular and without murmur, thrill or PMI displacement.  Normal work of breathing with lungs clear to auscultation and percussion.  Extremities are warm and well perfused.  Abdomen is superficially tender from umbilicus to back in a 3-4cm span ending just above the iliac crest.  No iliac crest, rib tenderness.  Just past the umbilicus, to the left, abdomen is soft, nontender and nondistended.  Normal bowel sounds.  Tenderness to vertebra from L2-S1.  No CVA tenderness.  No skin rash.  Lower extremity strength 5/5, gait intact, no urinary or fecal incontinence.  Left (correction from prior note - it was the left leg) LE diffuse paresthesia, chronic, noted by patient as diabetic neuropathy.  Patellar reflex ilicited, 2+ right, 1+ left.    Impression & Recommendations:  Problem # 1:  BACK  PAIN WITH RADICULOPATHY (ICD-729.2) Assessment: Unchanged Increasing pain medication. Will obtain initial imaging of lumbar and thoracic spine. return to clinic one week. Orders: Diagnostic X-Ray/Fluoroscopy (Diagnostic X-Ray/Flu) [facility]- Est Level  3 (16109)   Complete Medication List: 1)  Albuterol 90 Mcg/act Aers (Albuterol) .... Inhale 2 puff using inhaler every four hours 2)  Glipizide 10 Mg Tabs (Glipizide) .... Take one tablet by mouth two times a day 3)  Albuterol Sulfate (2.5 Mg/84ml) 0.083% Nebu (Albuterol sulfate) .... Per manufacturer instructions as needed wheezing 4)  Novolog Mix 70/30 70-30 % Susp (Insulin aspart prot & aspart) .... 40 units subcutaneous 30 minutes before 5pm meal and 30 units subcutaneous 30 minutes before 7am meal 5)  Advair Diskus 500-50 Mcg/dose Misc (Fluticasone-salmeterol) .Marland Kitchen.. 1 puff in the morning and 1 puff in the evening 6)  Fluoxetine Hcl 10 Mg Caps (Fluoxetine hcl) .... One tablet at 5pm daily 7)  Metformin Hcl 500 Mg Tabs (Metformin hcl) .... Take two tablets with dinner once daily 8)  Simvastatin 40 Mg Tabs (Simvastatin) .... One tablet by mouth at bedtime 9)  Anacin 81 Mg Tbec (Aspirin) .... Take one tablet by mouth daily 10)  Meloxicam 15 Mg Tabs (Meloxicam) .Marland Kitchen.. 1 tablet by mouth daily with food 11)  Promethazine  Hcl 12.5 Mg Tabs (Promethazine hcl) .Marland Kitchen.. 1-2 tablets by mouth every 6-8 hours as needed for nausea 12)  Smz-tmp Ds 800-160 Mg Tabs (Sulfamethoxazole-trimethoprim) .... Take one tablet twice a day for 5 days 13)  Fluconazole 150 Mg Tabs (Fluconazole) .... One tablet by mouth once 14)  Miralax Powd (Polyethylene glycol 3350) .Marland Kitchen.. 17g dissolved in 8oz of water, twice a day for three days. thereafter 17g daily in 8oz water.  disp one tub. 15)  Vicodin 5-500 Mg Tabs (Hydrocodone-acetaminophen) .... One tablet every six hours for pain.   Patient Instructions: 1)  Please follow-up in one week.   Prescriptions: VICODIN 5-500 MG  TABS  (HYDROCODONE-ACETAMINOPHEN) One tablet every six hours for pain.  #30 x 0   Entered and Authorized by:   Towana Badger MD   Signed by:   Towana Badger MD on 11/21/2007   Method used:   Printed then faxed to ...       [facility]  [address]       [address], [address]       [place], [place]  [address]       Ph: [phone]       Fax: [phone]   RxID:   [id number]  ]  Appended Document: pain has not abated   Medication Administration  Injection # 1:    Medication: Ketorolac-Toradol 15mg     Diagnosis: ABDOMINAL PAIN (ICD-789.00)    Route: IM    Site: LUOQ gluteus    Exp Date: 07/11/2008    Lot #: [id number]    Mfr: [email or web address]    Comments: PT RECEIVED TORADOL 60 MG IM X 1    Patient tolerated injection without complications    Given by: Dedra Skeens CMA, (Nov 22, 2007 1:58 PM)  Orders Added: 1)  Admin of Therapeutic Inj  intramuscular or subcutaneous [96372] 2)  Ketorolac-Toradol 15mg  [J1885]   Complete Medication List: 1)  Albuterol 90 Mcg/act Aers (Albuterol) .... Inhale 2 puff using inhaler every four hours 2)  Glipizide 10 Mg Tabs (Glipizide) .... Take one tablet by mouth two times a day 3)  Albuterol Sulfate (2.5 Mg/50ml) 0.083% Nebu (Albuterol sulfate) .... Per manufacturer instructions as needed wheezing 4)  Novolog Mix 70/30 70-30 % Susp (Insulin aspart prot & aspart) .... 40 units subcutaneous 30 minutes before 5pm meal and 30 units subcutaneous 30 minutes before 7am meal 5)  Advair Diskus 500-50 Mcg/dose Misc (Fluticasone-salmeterol) .Marland Kitchen.. 1 puff in the morning and 1 puff in the evening 6)  Fluoxetine Hcl 10 Mg Caps (Fluoxetine hcl) .... One tablet at 5pm daily 7)  Metformin Hcl 500 Mg Tabs (Metformin hcl) .... Take two tablets with dinner once daily 8)  Simvastatin 40 Mg Tabs (Simvastatin) .... One tablet by mouth at bedtime 9)  Anacin 81 Mg Tbec (Aspirin) .... Take one tablet by mouth daily 10)  Meloxicam 15 Mg Tabs (Meloxicam) .Marland Kitchen.. 1 tablet  by mouth daily with food 11)  Promethazine Hcl 12.5 Mg Tabs (Promethazine hcl) .Marland Kitchen.. 1-2 tablets by mouth every 6-8 hours as needed for nausea 12)  Fluconazole 150 Mg Tabs (Fluconazole) .... One tablet by mouth once 13)  Miralax Powd (Polyethylene glycol 3350) .Marland Kitchen.. 17g dissolved in 8oz of water, twice a day for three days. thereafter 17g daily in 8oz water.  disp one tub. 14)  Vicodin 5-500 Mg Tabs (Hydrocodone-acetaminophen) .... One tablet every six hours for pain.

## 2010-08-12 NOTE — Progress Notes (Signed)
Summary: speak to rn  Phone Note Call from Patient Call back at Home Phone [phone]   Caller: Patient Summary of Call: requesting antibiotic be changed as it is causing diarrhea and vomiting  Initial call taken by: Dedra Skeens CMA,,  December 20, 2007 9:10 AM  Follow-up for Phone Call        d/c from hosp on ciprofloxaxen. has abcess on her side. has a drain in place. has been on this for 2 days and has had bad diarrhea ever since .  uses [facility] [address] in [place]  message to pcp Follow-up by: Golden Circle RN,  December 20, 2007 9:11 AM  Additional Follow-up for Phone Call Additional follow up Details #1::        pt is returning call Additional Follow-up by: Knox Royalty,  December 20, 2007 1:36 PM    Additional Follow-up for Phone Call Additional follow up Details #2::    I spoke to the patient and have switched her to Bactrim as her wound cx is sensitive to this medication. Follow-up by: [name]  MD,  December 21, 2007 3:04 PM  New/Updated Medications: BACTRIM DS 800-160 MG  TABS (SULFAMETHOXAZOLE-TRIMETHOPRIM) one tablet by mouth two times a day x7 days   Prescriptions: BACTRIM DS 800-160 MG  TABS (SULFAMETHOXAZOLE-TRIMETHOPRIM) one tablet by mouth two times a day x7 days  #14 x 0   Entered and Authorized by:   [name]  MD   Signed by:   [name]  MD on 12/21/2007   Method used:   Electronically sent to ...       [facility]       [address]       [place], [place]  [address]       Ph: [phone]       Fax: [phone]   RxID:   [id number]

## 2010-08-12 NOTE — Assessment & Plan Note (Signed)
Summary: hx asthma, chest tightness, fever yesterday/ls   Vital Signs:  Patient Profile:   40 Years Old Female Weight:      219 pounds Temp:     99.1 degrees F Pulse rate:   95 / minute BP sitting:   137 / 77  Pt. in pain?   yes    Location:   generalized aching    Intensity:   5  Vitals Entered By: Dennison Nancy RN (January 11, 2007 3:14 PM)                 Chief Complaint:  Chest tightness and laryngitis.  History of Present Illness: 40 yo female with history of asthma and diabetes presents with 10 day history of chest tightness and lost voice.  Pt states that she started feeling sick early last week during [place] with sore throat and runny nose.  Lost her voice then and started coughing more, cough has become productive of yellow sputum in the last 3 days.  She also complains of subjective fever, with only temperature measured at home 100.2.  No pleurisy.  She has been trying to drink lots of fluid, mostly SunnyDelight.  Denies sick contacts.  States that last year she was admitted to hospital for an episode of bronchitis and dehydration.  Diabetes - blood sugars have been running in low 300's.  Pt states that normal for her is 150-180, but attributes this to drinking lots of sunnydelight.  Pt on Lantus 40 units.  No vision changes, foot complaints.  Asthma - Pt on advair and albuterol, normally uses albuterol 3x per week, since sick has been using inhaler 3x per day.  Increased dsypnea, no cyanosis.  Pt is a smoker and smokes 1/2 ppd.  Denies EtOH.  Advair Diskus (Disk with Device 500-50 mcg/Dose) 1 puff as directed twice a day      Albuterol (Aerosol 90 mcg/Actuation) 2 puff using inhaler every four hours as needed      fluticasone (Aerosol, Spray 50 mcg/Actuation) 2 spray into both nostrils once a day      glipizide (Tablet 10 mg) 1 tablet by mouth twice a day      loratadine (Tablet 10 mg) 1 tablet by mouth once a day      metformin (Tablet 500 mg) 2  tablet by mouth twice a day      omeprazole (Capsule, Delayed Release(E.C.) 20 mg) 1 capsule by mouth twice a day for reflux      Phenergan (Tablet 25 mg) 1 tablet by mouth every six hours as needed       Current Allergies: No known allergies      Risk Factors:     Counseled to quit/cut down tobacco use:  yes Alcohol use:  no   Review of Systems  The patient denies fever, chest pain, and abdominal pain.     Physical Exam  General:     Well-developed,well-nourished,in no acute distress; alert,appropriate and cooperative throughout examination Head:     Normocephalic and atraumatic without obvious abnormalities. No apparent alopecia or balding. Eyes:     No corneal or conjunctival inflammation noted. EOMI. Perrla. Funduscopic exam benign, without hemorrhages, exudates or papilledema. Vision grossly normal. Ears:     External ear exam shows no significant lesions or deformities.  Otoscopic examination reveals clear canals, tympanic membranes are intact bilaterally without bulging, retraction, inflammation or discharge. Hearing is grossly normal bilaterally. Nose:     External nasal examination shows  no deformity or inflammation. Nasal mucosa are pink and moist without lesions or exudates. Mouth:     Oral mucosa and oropharynx without lesions or exudates.  Slight pharyngeal erythema.   Neck:     No deformities, masses, or tenderness noted. Lungs:     Bilateral inspiratory and expiratory wheezing, increased work of breathing. Heart:     Normal rate and regular rhythm. S1 and S2 normal without gallop, murmur, click, rub or other extra sounds. Abdomen:     Bowel sounds positive,abdomen soft and non-tender without masses, organomegaly or hernias noted. Pulses:     R radial normal and L radial normal.   Extremities:     No clubbing, cyanosis, edema, or deformity noted with normal full range of motion of all joints.   Neurologic:     alert & oriented X3.   Skin:     Intact  without suspicious lesions or rashes Cervical Nodes:     No lymphadenopathy noted    Impression & Recommendations:  Problem # 1:  DYSPNEA/SHORTNESS OF BREATH (ICD-786.09) Assessment: New Pt with 10+ day history of cough and chest congestion.  Likely bronchitis/laryngitis picture with secondary worsening of asthma.  Peak flows measured at 200, 200, 275.  Pt to use albuterol as needed, given prescription for Z-pack 250 mg tablets, 2 on first day and 1 on days 2-5.  Return to clinic early next week to reassess.  Orders: [facility]- Est  Level 4 ([address])   Problem # 2:  DIABETES MELLITUS II, UNCOMPLICATED (ICD-250.00) Assessment: Deteriorated Given increased blood sugars to low 300s from baseline of 150-180s, have asked pt to increase dose of Lantus while she is ill.  This increase probably secondary to infection and increased use of SunnyDelight. Orders: [facility]- Est  Level 4 ([address])   Problem # 3:  ASTHMA, UNSPECIFIED (ICD-493.90) Assessment: Deteriorated Peak flow 200, 200, 275.  Pt on Advair and Albuterol inhaler. Orders: [facility]- Est  Level 4 ([address])   Problem # 4:  TOBACCO DEPENDENCE (ICD-305.1) Assessment: Unchanged Continued to counsel pt to quit smoking   Patient Instructions: 1)  Return to clinic early next week. 2)  Increase Lantus to 44 Units while still sick. 3)  Drink plenty of fluids, preferably water.

## 2010-08-12 NOTE — Assessment & Plan Note (Signed)
Summary: Rx clinic: DM follow up   Vital Signs:  Patient Profile:   40 Years Old Female Height:     66.25 inches Weight:      225.9 pounds Pulse rate:   101 / minute BP sitting:   132 / 83                 PCP:  [name]  MD  Chief Complaint:  Diabetes Management.  History of Present Illness: 40 yo female presents for follow up on DM.  She states her blood sugars are running between 200 and 350 mg/dl.  This morning her blood sugar was 223 mg/dl.  She states she is still passing out around 2 to 4 times a day.  She is not checking her blood sugar routinely due to not having strips.  She states that she has just received more strips.  After she passes out, she is weak, tired and wants to lie down.  She states she is having hyperglycemic symptoms (increased urination, increased thirst).  States she is smoking and she is interested in quiting later on but not right now.  Diet consists of baked meats, potatoes, wheat rice, steamed vegatables, bread, and limited sweets.  She did not bring in her meter or her log book.  Diabetes Management History:      She is (or has been) enrolled in the "Diabetic Education Program".  She notes lack of understanding of dietary principles but states that she is following her diet appropriately.  Sensory loss is noted.  Self foot exams are being performed.  She is checking home blood sugars.  She says that she is not exercising regularly.        Hyperglycemic symptoms include polyuria and polydipsia.       Prior Medication List:  ALBUTEROL 90 MCG/ACT AERS (ALBUTEROL) Inhale 2 puff using inhaler every four hours ALBUTEROL SULFATE (2.5 MG/3ML) 0.083%  NEBU (ALBUTEROL SULFATE) per manufacturer instructions as needed wheezing NOVOLOG MIX 70/30 70-30 %  SUSP (INSULIN ASPART PROT & ASPART) 50 units subcutaneous 30 minutes before 5pm meal and 50 units subcutaneous 30 minutes before 7am meal ADVAIR DISKUS 500-50 MCG/DOSE  MISC (FLUTICASONE-SALMETEROL) 1  puff in the morning and 1 puff in the evening FLUOXETINE HCL 40 MG CAPS (FLUOXETINE HCL) one tablet by mouth daily SIMVASTATIN 40 MG  TABS (SIMVASTATIN) one tablet by mouth at bedtime ANACIN 81 MG  TBEC (ASPIRIN) take one tablet by mouth daily PROMETHAZINE HCL 12.5 MG  TABS (PROMETHAZINE HCL) 1-2 tablets by mouth every 6-8 hours as needed for nausea MIRALAX   POWD (POLYETHYLENE GLYCOL 3350) 17g dissolved in 8oz of water, twice a day for three days. Thereafter 17g daily in 8oz water.  Disp one tub. * COMPRESSION STOCKINGS wear them for lower extremity swelling as needed FUROSEMIDE 20 MG TABS (FUROSEMIDE) one tablet by mouth daily as needed for lower extremity swelling ULTRAM 50 MG TABS (TRAMADOL HCL) one tablet by mouth every 6 hours as needed for breakthrough pain   Current Allergies: ! PENICILLIN ! * SINGULAIR    Risk Factors:     Counseled to quit/cut down tobacco use:  yes Exercise:  no      Impression & Recommendations:  Problem # 1:  DM W/NEURO MNFST, TYPE II, UNCONTROLLED (ICD-250.62) Assessment: Unchanged DM remains uncontrolled.  She reports not checking sugars regularly due to limited strips.  She states she has strips now and will increase to checking three times a day.  Discussed dietary issues  which she is doing ok with.  Discussed proper carbohydrate portion size verses vegatable intake.   Will recommend increase Novolog Mix 70/30 to 50 units 30 mintues before 7 am meal and 55 units subcutaneously 30 minutes before 5 pm meal.  Follow up with pharmacy clinic in 2 weeks.  Patient seen TFTFT: 45 minutes.  Patient seen with Quincy Simmonds, PharmD resident.  Her updated medication list for this problem includes:    Novolog Mix 70/30 70-30 % Susp (Insulin aspart prot & aspart) .Marland KitchenMarland KitchenMarland KitchenMarland Kitchen 55 units subcutaneous 30 minutes before 5pm meal and 50 units subcutaneous 30 minutes before 7am meal    Anacin 81 Mg Tbec (Aspirin) .Marland Kitchen... Take one tablet by mouth daily  Orders: A1C-[facility]  ([address]) Glucose Cap-[facility] ([address])   Problem # 2:  TOBACCO DEPENDENCE (ICD-305.1) Assessment: Unchanged Patient is at the contempalative stage of smoking cessation.  She states she used Chantix in the past and it helped well.  She would be interested in trying it in the future when she is ready to quit.  Problem # 3:  SYNCOPE (ICD-780.2) Assessment: Unchanged Patient reports passing out 3 to 4 times a day suddenly.  She experiences feeling sweaty, confused and weak after waking.  Occassionally will urinate on herself during this event. She has hit her head as she is passing out and reports she is no longer driving.   She reports her father also having syncope spells but it was related to a blood clot in his brain.  Will have patient monitor blood sugar after waking to determine if these episodes are related to hypoglycemia.    Complete Medication List: 1)  Albuterol 90 Mcg/act Aers (Albuterol) .... Inhale 2 puff using inhaler every four hours 2)  Albuterol Sulfate (2.5 Mg/33ml) 0.083% Nebu (Albuterol sulfate) .... Per manufacturer instructions as needed wheezing 3)  Novolog Mix 70/30 70-30 % Susp (Insulin aspart prot & aspart) .... 55 units subcutaneous 30 minutes before 5pm meal and 50 units subcutaneous 30 minutes before 7am meal 4)  Advair Diskus 500-50 Mcg/dose Misc (Fluticasone-salmeterol) .Marland Kitchen.. 1 puff in the morning and 1 puff in the evening 5)  Fluoxetine Hcl 40 Mg Caps (Fluoxetine hcl) .... One tablet by mouth daily 6)  Simvastatin 40 Mg Tabs (Simvastatin) .... One tablet by mouth at bedtime 7)  Anacin 81 Mg Tbec (Aspirin) .... Take one tablet by mouth daily 8)  Promethazine Hcl 12.5 Mg Tabs (Promethazine hcl) .Marland Kitchen.. 1-2 tablets by mouth every 6-8 hours as needed for nausea 9)  Miralax Powd (Polyethylene glycol 3350) .Marland Kitchen.. 17g dissolved in 8oz of water, twice a day for three days. thereafter 17g daily in 8oz water.  disp one tub. 10)  Compression Stockings  .... Wear them for lower extremity  swelling as needed 11)  Furosemide 20 Mg Tabs (Furosemide) .... One tablet by mouth daily as needed for lower extremity swelling 12)  Ultram 50 Mg Tabs (Tramadol hcl) .... One tablet by mouth every 6 hours as needed for breakthrough pain 13)  Elite-thin Insulin Syringe 31g X 5/16" 1 Ml Misc (Insulin syringe-needle u-100) .... Use as directed  Diabetes Management Assessment/Plan:      The following lipid goals have been established for the patient: Total cholesterol goal of 200; LDL cholesterol goal of 100; HDL cholesterol goal of 40; Triglyceride goal of 150.     Patient Instructions: 1)  Please schedule a follow-up appointment in 2 weeks in pharmacy clinic. 2)  Tobacco is very bad for your health and your loved  ones! You Should stop smoking!. 3)  Stop Smoking Tips: Choose a Quit date. Cut down before the Quit date. decide what you will do as a substitute when you feel the urge to smoke(gum,toothpick,exercise). 4)  Check your blood sugars regularly. If your readings are usually above 300 or below 70 you should contact our office. 5)  It is important that your Diabetic A1c level is checked every 3 months.  We will check it today. 6)  Increase your Novolog Mix 70/30 to 55 units subcutaneously 30 minutes before your 5 pm meal and continue 50 units subcutaneously 30 minutes before your 7 am meal. 7)  Check your blood sugars three times a day and occasionally after you pass out.     Prescriptions: ELITE-THIN INSULIN SYRINGE 31G X 5/16" 1 ML MISC (INSULIN SYRINGE-NEEDLE U-100) Use as directed  #1 x 6   Entered by:   DAWN PETTUS  Pharm D   Authorized by:   [name]  MD   Signed by:   Steve Rattler  Pharm D on 08/07/2008   Method used:   Electronically to        [facility]  [address] (retail)       [name]       [place], [place]  [address]       Ph: [phone]       Fax: [phone]   RxID:   [id number]   Appended Document: A1c  report    Lab Visit   Laboratory Results   Blood Tests   Date/Time Received: August 07, 2008 10:00 AM  Date/Time Reported: August 07, 2008 11:51 AM   HGBA1C: 11.1%   (Normal Range: Non-Diabetic - 3-6%   Control Diabetic - 6-8%)  Comments: ..............test performed by Maree Erie (SMA)...... ...........verified by Dewitt Hoes, [place](ASCP).........     Orders Today:

## 2010-08-12 NOTE — Assessment & Plan Note (Signed)
Summary: new ?bipolar dx? and new meds- lamictal and depakote   Vital Signs:  Patient profile:   40 year old female Height:      66.25 inches Weight:      226.8 pounds BMI:     36.46 Temp:     97.7 degrees F oral Pulse rate:   91 / minute BP sitting:   134 / 85  (right arm) Cuff size:   regular  Vitals Entered By: Gladstone Pih (June 10, 2009 8:41 AM) CC: here to discuss medications Is Patient Diabetic? Yes Did you bring your meter with you today? No Pain Assessment Patient in pain? yes     Location: foot Intensity: 5 Type: stinging   Primary Care Provider:  [name]  MD  CC:  here to discuss medications.  History of Present Illness: 40yo F here to go over medications.  New meds: She was started on Lamictal and Depakote by Dr. Tiajuana Amass (psychiatrist).  She states that he told her she was bipolar.  She is currently tapering up the Lamictal to a dose of 200mg  daily.  She reports that she is feeling better but has some nausea with the medications.  She is still on all of her previous meds.    Diabetic neuropathy: States that her pain of her feet are not adequately controlled.  State that the ultram helps with the pain.  No new ulcers on the feet.  States that she is taking all of her diabetic meds and her CBGs are elevated.  On average, 290s.  Last A1c 12.2 on 8/18.  Habits & Providers  Alcohol-Tobacco-Diet     Tobacco Status: current     Tobacco Counseling: to quit use of tobacco products     Cigarette Packs/Day: 0.5  Current Medications (verified): 1)  Albuterol 90 Mcg/act Aers (Albuterol) .... Inhale 2 Puff Using Inhaler Every Four Hours W/ Spacer Please Disp Spacer As Well 2)  Novolog Mix 70/30 70-30 %  Susp (Insulin Aspart Prot & Aspart) .... 50 Units Subcutaneous 30 Minutes Before 5pm Meal and 55 Units Subcutaneous 30 Minutes Before 7am Meal 3)  Advair Diskus 500-50 Mcg/dose  Misc (Fluticasone-Salmeterol) .Marland Kitchen.. 1 Puff in The Morning and 1 Puff in  The Evening 4)  Fluoxetine Hcl 40 Mg Caps (Fluoxetine Hcl) .Marland Kitchen.. 1 Tablet By Mouth Daily 5)  Crestor 20 Mg Tabs (Rosuvastatin Calcium) .... One Tablet By Mouth At Bedtime For High Cholesterol 6)  Anacin 81 Mg  Tbec (Aspirin) .... Take One Tablet By Mouth Daily 7)  Miralax   Powd (Polyethylene Glycol 3350) .Marland Kitchen.. 17g Dissolved in 8oz of Water, Twice A Day For Three Days. Thereafter 17g Daily in [employer].  Disp One Tub. 8)  Compression Stockings .... Wear Them For Lower Extremity Swelling As Needed 9)  Prilosec 20 Mg Cpdr (Omeprazole) .... Take One Tablet Each Morning 10)  Clonazepam 0.5 Mg Tabs (Clonazepam) .Marland Kitchen.. 1 Tab Two Times A Day Scheduled.  One Tab Mid-Day As Needed Per Dr. Donell Beers (Psych) 11)  Tramadol Hcl 50 Mg Tabs (Tramadol Hcl) .Marland Kitchen.. 1 By Mouth Q 6 Hours As Needed Breakthrough Pain 12)  Januvia 100 Mg Tabs (Sitagliptin Phosphate) .... 1/2 Tablet By Mouth Daily For Diabetes 13)  Lamictal 25 Mg Tabs (Lamotrigine) .... Titration Up To 200mg  14)  Depakote Er 500 Mg Xr24h-Tab (Divalproex Sodium) .... 2 Tablets By Mouth At Bedtime 15)  Gabapentin 300 Mg Caps (Gabapentin) .Marland Kitchen.. 1 Tab By Mouth Daily X3days, 1 Tab Two Times A  Day X 3days, Then 1 Tab Three Times A Day  Allergies (verified): 1)  ! Penicillin 2)  ! * Singulair 3)  ! * Morphine 4)  Amoxicillin (Amoxicillin) 5)  Amoxicillin (Amoxicillin)  Review of Systems       no CP, SOB, falls, dizziness, or rashes  Physical Exam  General:  VS reviewed.  Appears more "normal" today, ambulating, A&O x3 Extremities:  Feet: No new ulcers or lesions Psych:  Oriented X3, memory intact for recent and remote, good eye contact, and not anxious appearing.   No suicidal or homicidal ideations No history of mania   Impression & Recommendations:  Problem # 1:  ? of BIPOLAR DISORDER UNSPECIFIED (ICD-296.80) Assessment New Diagnosis per Dr. Tiajuana Amass.  She has f/u with him this month.  Currently on Depakote, Lamictal, Fluoxetine, and  Klonopin.  Not manic. Appears more normal than usual.  Plan on obtaining records from Dr. Tomasa Rand.     Orders: [facility]- Est Level  3 ([address])  Problem # 2:  DIAB W/NEURO MANIFESTS TYPE II/UNS TYPE UNCNTRL (ICD-250.62) Assessment: Deteriorated Check A1c today.  She has a known history of not being compliant with her medications.  We had a lengthy discussion about the use of ultram and how I didn't want her to be dependent on the medication.  Instead, I wanted to get her diabetes under better control.  Will start gabapentin taper for her acute symptoms.  Will have her return in 1 month to reassess neuropathy and adjust diabetes regimen.  She may likely need to see Dr. Raymondo Band in pharm clinic to better control her DM.   Her updated medication list for this problem includes:    Novolog Mix 70/30 70-30 % Susp (Insulin aspart prot & aspart) .Marland KitchenMarland KitchenMarland KitchenMarland Kitchen 50 units subcutaneous 30 minutes before 5pm meal and 55 units subcutaneous 30 minutes before 7am meal    Anacin 81 Mg Tbec (Aspirin) .Marland Kitchen... Take one tablet by mouth daily    Januvia 100 Mg Tabs (Sitagliptin phosphate) .Marland Kitchen... 1/2 tablet by mouth daily for diabetes  Orders: A1C-[facility] ([address]) [facility]- Est Level  3 ([address])  Complete Medication List: 1)  Albuterol 90 Mcg/act Aers (Albuterol) .... Inhale 2 puff using inhaler every four hours w/ spacer please disp spacer as well 2)  Novolog Mix 70/30 70-30 % Susp (Insulin aspart prot & aspart) .... 50 units subcutaneous 30 minutes before 5pm meal and 55 units subcutaneous 30 minutes before 7am meal 3)  Advair Diskus 500-50 Mcg/dose Misc (Fluticasone-salmeterol) .Marland Kitchen.. 1 puff in the morning and 1 puff in the evening 4)  Fluoxetine Hcl 40 Mg Caps (Fluoxetine hcl) .Marland Kitchen.. 1 tablet by mouth daily 5)  Crestor 20 Mg Tabs (Rosuvastatin calcium) .... One tablet by mouth at bedtime for high cholesterol 6)  Anacin 81 Mg Tbec (Aspirin) .... Take one tablet by mouth daily 7)  Miralax Powd (Polyethylene glycol 3350) .Marland Kitchen.. 17g dissolved in  8oz of water, twice a day for three days. thereafter 17g daily in 8oz water.  disp one tub. 8)  Compression Stockings  .... Wear them for lower extremity swelling as needed 9)  Prilosec 20 Mg Cpdr (Omeprazole) .... Take one tablet each morning 10)  Clonazepam 0.5 Mg Tabs (Clonazepam) .Marland Kitchen.. 1 tab two times a day scheduled.  one tab mid-day as needed per dr. Donell Beers (psych) 11)  Tramadol Hcl 50 Mg Tabs (Tramadol hcl) .Marland Kitchen.. 1 by mouth q 6 hours as needed breakthrough pain 12)  Januvia 100 Mg Tabs (Sitagliptin phosphate) .... 1/2 tablet  by mouth daily for diabetes 13)  Lamictal 25 Mg Tabs (Lamotrigine) .... Titration up to 200mg  14)  Depakote Er 500 Mg Xr24h-tab (Divalproex sodium) .... 2 tablets by mouth at bedtime 15)  Gabapentin 300 Mg Caps (Gabapentin) .Marland Kitchen.. 1 tab by mouth daily x3days, 1 tab two times a day x 3days, then 1 tab three times a day  Patient Instructions: 1)  Please schedule a follow-up appointment in 1 month.  2)  Today I started you on Gabapentin for the nerve pain.  Follow the instructions carefully. 3)  We'll address your diabetes regimen at the next visit. Prescriptions: TRAMADOL HCL 50 MG TABS (TRAMADOL HCL) 1 by mouth q 6 hours as needed breakthrough pain  #30 x 0   Entered and Authorized by:   [name]  MD   Signed by:   Bonnie Swaziland on 06/10/2009   Method used:   Electronically to        [facility] [address] (retail)       [address], [address]       [place], [place]  [address]       Ph: [phone]       Fax: [phone]   RxID:   [id number] GABAPENTIN 300 MG CAPS (GABAPENTIN) 1 tab by mouth daily x3days, 1 tab two times a day x 3days, then 1 tab three times a day  #90 x 1   Entered and Authorized by:   [name]  MD   Signed by:   Bonnie Swaziland on 06/10/2009   Method used:   Electronically to        [facility] [address] (retail)       [address], [address]       [place], [place]  [address]       Ph: [phone]       Fax: [phone]   RxID:   [id number]    Prevention & Chronic Care Immunizations   Influenza vaccine: Fluvax Non-MCR  (04/30/2009)   Influenza vaccine due: 03/28/2009    Tetanus booster: Not documented    Pneumococcal vaccine: Not documented  Other Screening   Pap smear: Not documented    Mammogram: Not documented   Smoking status: current  (06/10/2009)   Smoking cessation counseling: yes  (08/07/2008)  Diabetes Mellitus   HgbA1C: 12.8  (06/10/2009)   Hemoglobin A1C due: 11/30/2007    Eye exam: Not documented    Foot exam: yes  (05/09/2008)   High risk foot: Not documented   Foot care education: Not documented   Foot exam due: 10/29/2008    Urine microalbumin/creatinine ratio: Not documented    Diabetes flowsheet reviewed?: Yes   Progress toward A1C goal: Unchanged  Lipids   Total Cholesterol: 217  (09/02/2007)   LDL: See Comment mg/dL  (88/41/6606)   LDL Direct: 145  (04/30/2009)   HDL: 32  (09/02/2007)   Triglycerides: 462  (09/02/2007)    SGOT (AST): 17  (07/17/2007)   SGPT (ALT): 27  (07/17/2007)   Alkaline phosphatase: 108  (07/17/2007)   Total bilirubin: 0.4  (07/17/2007)  Self-Management Support :   Personal Goals (by the next clinic visit) :     Personal A1C goal: 8  (04/30/2009)     Personal blood pressure goal: 140/90  (04/30/2009)     Personal LDL goal: 100  (04/30/2009)    Patient will  work on the following items until the next clinic visit to reach self-care goals:     Medications and monitoring: take my medicines every day, check my blood sugar, bring all of my medications to every visit  (06/10/2009)     Eating: drink diet soda or water instead of juice or soda, eat more vegetables, use fresh or frozen vegetables, eat foods that are low in salt, eat baked foods instead of fried foods, eat fruit for snacks and desserts, limit or avoid alcohol  (06/10/2009)     Activity: take a 30 minute  walk every day  (04/30/2009)    Diabetes self-management support: CBG self-monitoring log, Written self-care plan, Education handout  (06/10/2009)   Diabetes care plan printed   Diabetes education handout printed    Lipid self-management support: Written self-care plan  (06/10/2009)   Lipid self-care plan printed.   Nursing Instructions: Give tetanus booster today     Laboratory Results   Blood Tests   Date/Time Received: June 10, 2009 8:49 AM  Date/Time Reported: June 10, 2009 9:25 AM   HGBA1C: 12.8%   (Normal Range: Non-Diabetic - 3-6%   Control Diabetic - 6-8%)  Comments: ...............test performed by......Marland KitchenBonnie A. Swaziland, MLS (ASCP)cm

## 2010-08-12 NOTE — Assessment & Plan Note (Signed)
Summary: vomiting-has dm   Vital Signs:  Patient Profile:   40 Years Old Female Height:     66.25 inches Weight:      227.7 pounds Temp:     98 degrees F oral BP sitting:   118 / 80  (right arm)  Pt. in pain?   no  Vitals Entered By: Arlyss Repress CMA, (June 11, 2007 3:07 PM)                  PCP:  [name]  MD  Chief Complaint:  diarrhea/vomitting since Saturday. sore ribs from vomitting.  History of Present Illness: 40 yo with hx DM / asthma c/o fever ( 102 on 11/29) , vomiting , diarrhea x 2 days.  Decrease food intake, and midly decreased fluid intake. Today vomit x 3 and BM x 3 (non bloody) Using albuterol  three times a day 2 to SOB/ asthma exacerbation , but still wheezing and chest tightness. Some SOB when she walk. Also runny nose, cough.  + sick contact with URI. During PE Nechelle is afebrile, speaks in full sentences, non toxic appereance, no increased resp. effort., good eye contact, NAD.   Current Allergies: ! PENICILLIN ! * SINGULAIR      Physical Exam  General:     alert, well-hydrated, and cooperative to examination.   Head:     normocephalic and atraumatic.   Eyes:     vision grossly intact and no injection.   Ears:     External ear exam shows no significant lesions or deformities.  Otoscopic examination reveals clear canals, tympanic membranes are intact bilaterally without bulging, retraction, inflammation or discharge. Hearing is grossly normal bilaterally. Nose:     nasal congestion, clear nasal discharge. Mucosal edema / erythema. NO sinus tenderness Mouth:     pharyngeal erythema, no exudates  Lungs:     very mildly decreased BS , no wheezing. Increased expiration time.  Heart:     Normal rate and regular rhythm. S1 and S2 normal without gallop, murmur, click, rub or other extra sounds. Abdomen:     Bowel sounds positive,abdomen soft and non-tender without masses, organomegaly or hernias noted. Pulses:     2 + peripheral  pulses Extremities:     no edema  Neurologic:     alert & oriented X3.   Skin:     Intact without suspicious lesions or rashes Psych:     normally interactive, good eye contact, not anxious appearing, and not depressed appearing.      Impression & Recommendations:  Problem # 1:  VIRAL INFECTION (ICD-079.99) URI plus GI symptoms  2 to viral infection ( + sick contact) . Increase fluid intake, tylenol as needed fever/ HA. Nasonex for nasal congestion. clear liquid diet and advance as tolerated. Phenergan as needed for nausea.  Given this pt DM , she will check fasting and pre lunch/ dinner sugars and adjust insulin dose as needed. BMET pending for electrolytes/ kidney evaluation.   If unable to keep fluids in the stomach  or not improvement within 48 hours , pt must return to [facility] ( see instructions) Orders: [facility]- Est Level  3 ([address])   Problem # 2:  CHRONIC OBSTRUCTIVE PULMONARY DISEASE, ACUTE EXACERBATION (ICD-491.21) Asthma / COPD exacerbation 2 to # 1.Pt is clinically stable, O2sats  94 % , peak flow  ~ 300's. Out pt management. Continue advair, spiriva. Schedule albuterol every 4 hours for a wk ( titrated down as your wheezing, chest  tightness improve. Pt will complete prednisone  course x 1 wk and zithromax for 5 days. If no improvement within 24-48 hours , pt must return to [facility]/ ER Orders: [facility]- Est Level  3 ([address]) Basic Met-[facility] ([address])   Complete Medication List: 1)  Albuterol 90 Mcg/act Aers (Albuterol) .... Inhale 2 puff using inhaler every four hours 2)  Glipizide 10 Mg Tabs (Glipizide) .... Take one tablet by mouth two times a day 3)  Albuterol Sulfate (2.5 Mg/26ml) 0.083% Nebu (Albuterol sulfate) .... Per manufacturer instructions as needed wheezing 4)  Novolog Mix 70/30 70-30 % Susp (Insulin aspart prot & aspart) .... 40 units subcutaneous 30 minutes before 5pm meal and 20 units subcutaneous 30 minutes before 7am meal 5)  Spiriva Handihaler 18 Mcg Caps (Tiotropium  bromide monohydrate) .... One inhalation daily 6)  Advair Diskus 250-50 Mcg/dose Misc (Fluticasone-salmeterol) .... One inhalation in the morning and one inhalation in the evening 7)  Fluoxetine Hcl 10 Mg Caps (Fluoxetine hcl) .... One tablet at 5pm daily 8)  Promethazine Hcl 25 Mg Tabs (Promethazine hcl) .Marland Kitchen.. 1 tab by mouth every 6 to 8 hours for nausea and vomiting. 9)  Prednisone 50 Mg Tabs (Prednisone) .Marland Kitchen.. 1 tab by mouth daily x 7 days 10)  Zithromax 250 Mg Tabs (Azithromycin) .... 2 tabs por today  , then 1 tab by mouth daily x 4 days ( starting on 12/02)   Patient Instructions: 1)  Get plenty of rest, drink lots of clear liquids, and use tylenol or Ibuprophen for fever and comfort. return in 7-10 days if you're not better:sooner if you're feeliong worse. 2)  Albuterol 2 puffs every 4 hours / every 2 hours as needed , then spaced up gradually until using it every 4 to 6 hours as needed.  3)   Phenergan every 6 to 8 hours for nausea and vomiting. 4)  Prednisone 1 tablet a day x 7 days and zithromax 2 tablets today, then 1 tablet a day ( starting on 12/02) 5)  Check fasting , and at lunch time / dinner time , is unable to eat , drink some juice to increase your blood sugar 6)  Soft diet and advance as tolerated.  7)  If not improved in 48 hours return to [facility] or ER.     Prescriptions: ZITHROMAX 250 MG  TABS (AZITHROMYCIN) 2 tabs por today  , then 1 tab by mouth daily x 4 days ( starting on 12/02)  #6 x 0   Entered and Authorized by:   Jackalyn Lombard MD   Signed by:   Jackalyn Lombard MD on 06/11/2007   Method used:   Electronically sent to ...       [facility]  [address]       [address]       [place], [place]  [address]       Ph: [phone]       Fax: [phone]   RxID:   [id number] PREDNISONE 50 MG  TABS (PREDNISONE) 1 tab by mouth daily x 7 days  #7 x 0   Entered and Authorized by:   Jackalyn Lombard MD   Signed by:    Jackalyn Lombard MD on 06/11/2007   Method used:   Electronically sent to ...       [facility]  [address]       [address], [address]  [place], [place]  [address]       Ph: [phone]       Fax: [phone]   RxID:   [id number] PROMETHAZINE HCL 25 MG  TABS (PROMETHAZINE HCL) 1 tab by mouth every 6 to 8 hours for nausea and vomiting.  #30 x 1   Entered and Authorized by:   Jackalyn Lombard MD   Signed by:   Jackalyn Lombard MD on 06/11/2007   Method used:   Electronically sent to ...       [facility]  [address]       [address]       [place], [place]  [address]       Ph: [phone]       Fax: [phone]   RxID:   [id number]  ]

## 2010-08-12 NOTE — Progress Notes (Signed)
Summary: Follow up call to patient.   Phone Note Outgoing Call Call back at [facility] Phone [phone]   Call placed by: Rodney Langton MD,  March 04, 2010 5:22 PM Summary of Call: Call to pt re: symptoms from prior visit.  She is taking antibiotics, has no fevers/chills, has CT abd set up for tomorrow, pain is a little better.  She missed her fu appt with Dr. Clotilde Dieter today but will call tomo morning and set up a fu with me on [place].   Initial call taken by: Rodney Langton MD,  March 04, 2010 5:24 PM

## 2010-08-12 NOTE — Progress Notes (Signed)
  Phone Note Call from Patient Call back at [phone]   Caller: Patient Summary of Call: has been vomitting and diarrahea since Saturday Initial call taken by: Rae Roam,  June 11, 2007 9:07 AM  Follow-up for Phone Call        vomited 2x today & diarrhea stopped yesterday. afebrile. eating crackers, egg,chicken.  brother has it as well. appt made. pcp not available Follow-up by: Golden Circle RN,  June 11, 2007 11:34 AM

## 2010-08-12 NOTE — Progress Notes (Signed)
Summary: triage   Phone Note Call from Patient Call back at Home Phone [phone]   Caller: Patient Summary of Call: pt says she has something warm running in her head and it don't feel right. Initial call taken by: Clydell Hakim,  April 27, 2009 11:29 AM  Follow-up for Phone Call        left message Follow-up by: Golden Circle RN,  April 27, 2009 12:18 PM  Additional Follow-up for Phone Call Additional follow up Details #1::        spoke with mom who said she is the one who told her dtr to call for an appt. relates that there is a warm,running sensation in her head. spoke with dtr who says this has been going on "for a while".  unable to get more detailed description. she will be here in 30 minutes to see work in md Additional Follow-up by: Golden Circle RN,  April 28, 2009 8:38 AM    Additional Follow-up for Phone Call Additional follow up Details #2::    Pt seen by Dr. Lafonda Mosses.  Agree with assessment. Follow-up by: [name]  MD,  April 28, 2009 9:39 PM

## 2010-08-12 NOTE — Assessment & Plan Note (Signed)
Summary: discuss disability/el    PCP:  [name]  MD  Chief Complaint:  hospital f/u for chest pain and discussion of dyspnea & DM.  History of Present Illness: [name] is here for a hospital f/u.  She was admitted on 2/21 for 24hr obs for chest pain.  It was thought at that time that her chest pain was noncardiac given nl EKG & neg CEs.  It was thought to be secondary to anxiety and possibly costochondritis.    Chest pain- Since her discharge, she reports that she has continued to have chest pain that is brought on with minimal exertion usually preceded by dyspnea.  The pain is substernal, improved with rest, but has not been severe enough that she has gone back to the ED.  She denies any N/V or radiating pain.    DM II- Pt reports that her CBGs range from 250s-400s.  She reports being adherent to her medications without any side effects.  Continues to experience polyuria.  She is experienceing worsening neuropathy of her feet.  No reported changes in vision.    COPD- She reports worsened dyspnea.  Minimal exertion causes dyspnea and dependence on O2.   States that she is adherent to her medications without any side effects.  She has stopped smoking for the past 3 weeks.  Tobacco Cessation- Reports that she has not smoked a cigarette for the past 3 weeks.    Lipid Management History:      Positive NCEP/ATP III risk factors include diabetes and HDL cholesterol less than 40.  Negative NCEP/ATP III risk factors include female age less than 45 years old, non-tobacco-user status, and non-hypertensive.       Current Allergies: ! PENICILLIN ! * SINGULAIR    Risk Factors:  Tobacco use:  quit   Review of Systems      See HPI   Physical Exam  General:     Overweight white female, in no acute distress; alert,appropriate and cooperative throughout examination Lungs:     Normal respiratory effort, chest expands symmetrically. Lungs are clear to auscultation, no crackles or  wheezes. Heart:     Normal rate and regular rhythm. S1 and S2 normal without gallop, murmur, click, rub or other extra sounds. Additional Exam:     Ambulation test without O2 conveyed an O2 saturation of >95%.    Impression & Recommendations:  Problem # 1:  CHEST PAIN UNSPECIFIED (ICD-786.50) Assessment: Deteriorated Given the comorbidities and hx of tobacco abuse, she is at inc. risk for CAD.  She would best benefit from a cardology referral for a cardiolyte test given that she is unable to perform an exercise stress test.  I will also provide her with nitroglycerin and strict instructions of when to return to the ED for evaluation.  She is currently on ASA, has quit smoking, and will start her on a statin.     Orders: Cardiology Referral (Cardiology) [facility]- Est  Level 4 ([address])   Problem # 2:  COPD (ICD-496) Assessment: Improved Her exam is reassuring.  She was able to ambulate adquately and maintain an O2 saturation above 95% without O2 and her lung exam conveyed no rales or wheezes.  Plan to continue her on the current regimen and will plan for PFTs with Dr. Raymondo Band after her evaluation with cardiology.    Her updated medication list for this problem includes:    Albuterol 90 Mcg/act Aers (Albuterol) ..... Inhale 2 puff using inhaler every four hours  Albuterol Sulfate (2.5 Mg/26ml) 0.083% Nebu (Albuterol sulfate) .Marland Kitchen... Per manufacturer instructions as needed wheezing    Spiriva Handihaler 18 Mcg Caps (Tiotropium bromide monohydrate) ..... One inhalation daily    Advair Diskus 500-50 Mcg/dose Misc (Fluticasone-salmeterol) .Marland Kitchen... 1 puff in the morning and 1 puff in the evening  Orders: [facility]- Est  Level 4 ([address])   Problem # 3:  DM W/NEURO MNFST, TYPE II, UNCONTROLLED (ICD-250.62) Assessment: Deteriorated Her A1c on 09/02/07 increased to 11.1%.  Plan to increase morning 70/30 to 30units and inc. metformin to 2 tablets.  Her updated medication list for this problem includes:     Glipizide 10 Mg Tabs (Glipizide) .Marland Kitchen... Take one tablet by mouth two times a day    Novolog Mix 70/30 70-30 % Susp (Insulin aspart prot & aspart) .Marland KitchenMarland KitchenMarland KitchenMarland Kitchen 40 units subcutaneous 30 minutes before 5pm meal and 30 units subcutaneous 30 minutes before 7am meal    Metformin Hcl 500 Mg Tabs (Metformin hcl) .Marland Kitchen... Take two tablets with dinner once daily    Anacin 81 Mg Tbec (Aspirin) .Marland Kitchen... Take one tablet by mouth daily  Orders: [facility]- Est  Level 4 ([address])   Problem # 4:  HYPERTRIGLYCERIDEMIA (ICD-272.1) Assessment: Unchanged Last lipid panel on 09/02/07 conveyed total chol- 217, trig 462, HDL 32.  It does not appear that the fenofibrate was effective.  Will plan to d/c the fibrate and start Lipitor.     The following medications were removed from the medication list:    Fenofibrate 160 Mg Tabs (Fenofibrate) .Marland Kitchen... Take one tablet daily with food  Her updated medication list for this problem includes:    Lipitor 40 Mg Tabs (Atorvastatin calcium) .Marland Kitchen... Take one tablet by mouth in the evening  Orders: [facility]- Est  Level 4 ([address])   Problem # 5:  TOBACCO DEPENDENCE (ICD-305.1) Assessment: Improved Pt has quit for the past 3 weeks.  Her husband is in the process of qutting.  I provided him with a prescription for Chantix while she was in the hospital.     Her updated medication list for this problem includes:    Chantix Starting Month Pak 0.5 Mg X 11 & 1 Mg X 42 Misc (Varenicline tartrate) .Marland Kitchen... Per manufacturer instructions    Chantix Continuing Month Pak 1 Mg Tabs (Varenicline tartrate) .Marland Kitchen... Per manufacturer instructions  Orders: [facility]- Est  Level 4 ([address])   Complete Medication List: 1)  Albuterol 90 Mcg/act Aers (Albuterol) .... Inhale 2 puff using inhaler every four hours 2)  Glipizide 10 Mg Tabs (Glipizide) .... Take one tablet by mouth two times a day 3)  Albuterol Sulfate (2.5 Mg/61ml) 0.083% Nebu (Albuterol sulfate) .... Per manufacturer instructions as needed wheezing 4)  Novolog Mix 70/30  70-30 % Susp (Insulin aspart prot & aspart) .... 40 units subcutaneous 30 minutes before 5pm meal and 30 units subcutaneous 30 minutes before 7am meal 5)  Spiriva Handihaler 18 Mcg Caps (Tiotropium bromide monohydrate) .... One inhalation daily 6)  Advair Diskus 500-50 Mcg/dose Misc (Fluticasone-salmeterol) .Marland Kitchen.. 1 puff in the morning and 1 puff in the evening 7)  Fluoxetine Hcl 10 Mg Caps (Fluoxetine hcl) .... One tablet at 5pm daily 8)  Chantix Starting Month Pak 0.5 Mg X 11 & 1 Mg X 42 Misc (Varenicline tartrate) .... Per manufacturer instructions 9)  Chantix Continuing Month Pak 1 Mg Tabs (Varenicline tartrate) .... Per manufacturer instructions 10)  Metformin Hcl 500 Mg Tabs (Metformin hcl) .... Take two tablets with dinner once daily 11)  Lipitor 40 Mg Tabs (  Atorvastatin calcium) .... Take one tablet by mouth in the evening 12)  Nitroquick 0.4 Mg Subl (Nitroglycerin) .... Place one tablet underneath the tongue as needed with chest pain every five minutes until symptoms resolve or a maximum of three times 13)  Anacin 81 Mg Tbec (Aspirin) .... Take one tablet by mouth daily  Lipid Assessment/Plan:      Based on NCEP/ATP III, the patient's risk factor category is "history of diabetes".  From this information, the patient's calculated lipid goals are as follows: Total cholesterol goal is 200; LDL cholesterol goal is 100; HDL cholesterol goal is 40; Triglyceride goal is 150.     Patient Instructions: 1)  Please schedule a follow-up appointment in 1 month. 2)  I would like you to see a cardiologist for your chest pain to rule out possible damage to your heart.  This may involve a test that stresses your heart to determine if there is any hint of damage. 3)  I have given you nitroglycerin for your chest pain.  If it doesn't help with the chest pain, I want you to go to the ER. 4)  After walking you in the office today, it seems that you are not dependent on the oxygen which is great news.  We will  explore the function of your lungs in the next coming months. 5)  I have increase the amount of novolog for you to take in the morning and have double the amount of metformin in hopes of improving your diabetes. 6)  As far as your triglycerides are concern, they continue to be elevated despite the fenofibrate.  I want you to stop this medication and start the new Lipitor.    Prescriptions: ANACIN 81 MG  TBEC (ASPIRIN) take one tablet by mouth daily  #31 x 0   Entered and Authorized by:   [name]  MD   Signed by:   [name]  MD on 09/15/2007   Method used:   Historical   RxID:   [id number] NITROQUICK 0.4 MG  SUBL (NITROGLYCERIN) place one tablet underneath the tongue as needed with chest pain every five minutes until symptoms resolve or a maximum of three times  #3 x 1   Entered and Authorized by:   [name]  MD   Signed by:   [name]  MD on 09/13/2007   Method used:   Electronically sent to ...       [facility] [address]       [address]       [place], [place]  [address]       Ph: [phone]       Fax: [phone]   RxID:   [id number] LIPITOR 40 MG  TABS (ATORVASTATIN CALCIUM) take one tablet by mouth in the evening  #34 x 2   Entered and Authorized by:   [name]  MD   Signed by:   [name]  MD on 09/13/2007   Method used:   Electronically sent to ...       [facility] [address]       [address]       [place], [place]  [address]       Ph: [phone]       Fax: [phone]   RxID:   [id number]  ]

## 2010-08-12 NOTE — Assessment & Plan Note (Signed)
Summary: follow up diabetes/ls   Vital Signs:  Patient Profile:   40 Years Old Female Height:     66.25 inches Weight:      226.13 pounds BMI:     36.35 Temp:     98.3 degrees F Pulse rate:   99 / minute BP sitting:   150 / 85  Pt. in pain?   yes    Location:   left leg    Intensity:   8  Vitals Entered By: Dennison Nancy RN (March 07, 2008 5:00 PM)              Is Patient Diabetic? Yes       PCP:  [name]  MD  Chief Complaint:  leg pain and swelling.  History of Present Illness: 40yo F c/o b/l leg swelling and pain  Leg Swelling: It occurs in both legs usually at the end of the day.  Improved with elevation.  She reports some weeping of the legs at times (moisture coming through the skin).  No redness or bruising.  She has some assoicated leg pain that is described as sharp at times that are intermittent that goes down to her feet.  She denies any chest pain or shortness of breath.  Not on OCPs.  No long travels or recent surgeries besides the perinephric abscess drain.      Current Allergies: ! PENICILLIN ! * SINGULAIR    Risk Factors:     Counseled to quit/cut down tobacco use:  yes   Review of Systems  The patient denies fever, chest pain, syncope, and dyspnea on exertion.     Physical Exam  General:     Obese, pleasant, NAD Mouth:     Oral mucosa and oropharynx without lesions or exudates.  Teeth in good repair. Neck:     no JVD, no carotid bruits Lungs:     Normal respiratory effort, chest expands symmetrically. Lungs are clear to auscultation, no crackles or wheezes. Heart:     Normal rate and regular rhythm. S1 and S2 normal without gallop, murmur, click, rub or other extra sounds. Abdomen:     Bowel sounds positive,abdomen soft and non-tender without masses, organomegaly or hernias noted. no abd bruits Pulses:     2+ pedal Extremities:     trace edema b/l; no bruising; no lesions; equal circumference    Impression &  Recommendations:  Problem # 1:  LEG EDEMA (ICD-782.3) Assessment: New She has gained  ~6 lbs since I last saw her; no clear signs of R sided or L sided heart failure; will treat the leg edema with supportive care with compression stockings and Lasix as needed; if her symptoms worsen, then it may be time to get an ECHO;    Her updated medication list for this problem includes:    Furosemide 20 Mg Tabs (Furosemide) ..... One tablet by mouth daily as needed for lower extremity swelling  Orders: [facility]- Est  Level 4 ([address])   Problem # 2:  NEURALGIA (ICD-729.2) Assessment: Unchanged Her neuropathic pain likely 2/2 to her uncontrolled DM II.  Will start her on Gabapentin for her pain.  Her DM II has been very difficult to control.    Problem # 3:  TOBACCO DEPENDENCE (ICD-305.1) Assessment: Deteriorated She has gone back to smoking cigarettes.  Now smoking 2cig/day.  Adivsed to quit and offered different options.  She states that she will quit on her own.   Orders: [facility]- Est  Level 4 ([address])  Complete Medication List: 1)  Albuterol 90 Mcg/act Aers (Albuterol) .... Inhale 2 puff using inhaler every four hours 2)  Glipizide 10 Mg Tabs (Glipizide) .... Take one tablet by mouth two times a day 3)  Albuterol Sulfate (2.5 Mg/100ml) 0.083% Nebu (Albuterol sulfate) .... Per manufacturer instructions as needed wheezing 4)  Novolog Mix 70/30 70-30 % Susp (Insulin aspart prot & aspart) .... 40 units subcutaneous 30 minutes before 5pm meal and 40 units subcutaneous 30 minutes before 7am meal 5)  Advair Diskus 500-50 Mcg/dose Misc (Fluticasone-salmeterol) .Marland Kitchen.. 1 puff in the morning and 1 puff in the evening 6)  Fluoxetine Hcl 20 Mg Caps (Fluoxetine hcl) .... One tablet by mouth daily 7)  Metformin Hcl 500 Mg Tabs (Metformin hcl) .... Take two tablets with dinner once daily 8)  Simvastatin 40 Mg Tabs (Simvastatin) .... One tablet by mouth at bedtime 9)  Anacin 81 Mg Tbec (Aspirin) .... Take one tablet by  mouth daily 10)  Meloxicam 15 Mg Tabs (Meloxicam) .Marland Kitchen.. 1 tablet by mouth daily with food 11)  Promethazine Hcl 12.5 Mg Tabs (Promethazine hcl) .Marland Kitchen.. 1-2 tablets by mouth every 6-8 hours as needed for nausea 12)  Fluconazole 150 Mg Tabs (Fluconazole) .... One tablet by mouth once 13)  Miralax Powd (Polyethylene glycol 3350) .Marland Kitchen.. 17g dissolved in 8oz of water, twice a day for three days. thereafter 17g daily in 8oz water.  disp one tub. 14)  Compression Stockings  .... Wear them for lower extremity swelling as needed 15)  Furosemide 20 Mg Tabs (Furosemide) .... One tablet by mouth daily as needed for lower extremity swelling 16)  Gabapentin 300 Mg Caps (Gabapentin) .... Take one tablet by mouth for 3 days, then one tablet twice a day for 3 days, then one tablet three times a day  Other Orders: A1C-[facility] (308[address]) Urinalysis-[facility] (00000)   Patient Instructions: 1)  Please schedule a follow-up appointment in 3 months. 2)  I gave you one new medicatio for the swelling that you take as needed.  I also gave you a presciption for compression stockings to help with the swelling. 3)  I gave you a new medication for the leg pain that is likely associated with your diabetes.  follow the instructions carefully.   Prescriptions: GABAPENTIN 300 MG CAPS (GABAPENTIN) take one tablet by mouth for 3 days, then one tablet twice a day for 3 days, then one tablet three times a day  #90 x 2   Entered and Authorized by:   [name]  MD   Signed by:   [name]  MD on 03/07/2008   Method used:   Electronically to        [facility] [address] (retail)       [address], [address]       [place], [place]  [address]       Ph: [phone]       Fax: [phone]   RxID:   [id number] FUROSEMIDE 20 MG TABS (FUROSEMIDE) one tablet by mouth daily as needed for lower extremity swelling  #30 x 1   Entered and Authorized by:   [name]  MD   Signed by:   [name]  MD on 03/07/2008   Method used:   Electronically to        [facility] [address] (retail)       [address], [address]  [place], [place]  [address]       Ph: [phone]       Fax: [phone]   RxID:   [id number] COMPRESSION STOCKINGS wear them for lower extremity swelling as needed  #2 x 0   Entered and Authorized by:   [name]  MD   Signed by:   [name]  MD on 03/07/2008   Method used:   Handwritten   RxID:   [id number] FLUOXETINE HCL 20 MG CAPS (FLUOXETINE HCL) one tablet by mouth daily  #30 x 5   Entered and Authorized by:   [name]  MD   Signed by:   [name]  MD on 03/07/2008   Method used:   Electronically to        [facility] [address] (retail)       [address]       [place], [place]  [address]       Ph: [phone]       Fax: [phone]   RxID:   [id number]  ] Laboratory Results   Urine Tests  Date/Time Received: March 07, 2008 4:52 PM  Date/Time Reported: March 07, 2008 5:20 PM   Routine Urinalysis   Color: yellow Appearance: Clear Glucose: >=1000   (Normal Range: Negative) Bilirubin: negative   (Normal Range: Negative) Ketone: small (15)   (Normal Range: Negative) Spec. Gravity: >=1.030   (Normal Range: 1.003-1.035) Blood: negative   (Normal Range: Negative) pH: 5.5   (Normal Range: 5.0-8.0) Protein: 30   (Normal Range: Negative) Urobilinogen: 0.2   (Normal Range: 0-1) Nitrite: negative   (Normal Range: Negative) Leukocyte Esterace: negative   (Normal Range: Negative)  Urine Microscopic WBC/HPF: rare Bacteria/HPF: 1+ Epithelial/HPF: 1-5 Casts/LPF: 1-3 granular;   occ hyaline Yeast/HPF: few    Comments: ...............test performed by......Marland KitchenBonnie A. [place], [place] (ASCP)

## 2010-08-12 NOTE — Progress Notes (Signed)
Summary: pls call   Phone Note Call from Patient Call back at Home Phone [phone]   Caller: Patient Summary of Call: pt wants to talk to Dr Burnadette Pop about her scooter. Initial call taken by: De Nurse,  December 29, 2008 1:37 PM  Follow-up for Phone Call        pt advised to make an appt. she will come in and discuss her scooter. she states she will not go to see the psychiatrist it is too expensive. Follow-up by: Alphia Kava,  December 29, 2008 5:11 PM

## 2010-08-12 NOTE — Progress Notes (Signed)
Summary: headache  Phone Note Call from Patient   Caller: Patient Summary of Call: had stress test today. was given nitro. Now with bad headache and generalized soreness.  Wondering if pain med can be called in. Advised to take tylenol 1000 mg 3x daily and/or ibuprofen 800 mg every 8 hrs.  f/u to ED if any new cp. Initial call taken by: Benn Moulder MD,  October 05, 2007 6:40 PM

## 2010-08-12 NOTE — Progress Notes (Signed)
Summary: [place] request  Phone Note Call from Patient Call back at Home Phone [phone]   Reason for Call: Talk to Nurse Summary of Call: Pt states she is no better from last visit - states she is still having problems with her blood sugars and diarrhea and would like to speak with an rn about this. Initial call taken by: Haydee Salter,  March 01, 2007 10:57 AM  Follow-up for Phone Call        Spoke with pt - she states she is still having diarrhea twice per day, and her blood sugars are still in the 200s despite the lantus titration.  Pt states she does not have abdominal pain - only cramping when she has diarrhea.  States her mouth is still very dry, but she is drinking water all day long.  Will send to Dr. Burnadette Pop to determine what action would be appropriate as pt has appt with him 03/07/07.   Follow-up by: AMY MARTIN RN,  March 01, 2007 11:59 AM  Additional Follow-up for Phone Call Additional follow up Details #1::        Spoke with Dr. Burnadette Pop - there is no action to take at this pt - as pt has f/u appt 03/07/07.  Called pt to give her the message - no answer. Additional Follow-up by: AMY MARTIN RN,  March 02, 2007 4:07 PM    Additional Follow-up for Phone Call Additional follow up Details #2::    Phone call to pt- no answer. Follow-up by: AMY MARTIN RN,  March 05, 2007 12:17 PM
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
## 2010-08-12 NOTE — Assessment & Plan Note (Signed)
Summary: Diabetes follow-up         will need FLP soon   Vital Signs:  Patient profile:   40 year old female Height:      66.25 inches Weight:      210.9 pounds BMI:     33.91 Temp:     98.6 degrees F oral Pulse rate:   98 / minute BP sitting:   122 / 80  (left arm) Cuff size:   regular  Vitals Entered By: Jimmy Footman, CMA (June 24, 2010 1:35 PM) CC: follow up  Is Patient Diabetic? Yes Did you bring your meter with you today? No   Primary Care Provider:  Jamie Brookes MD  CC:  follow up .  History of Present Illness: DM2: sugars have been running high (200's) but had one on Monday that was in the 400's.  taking Gabapentin for the leg pain (neuropathy) , can only tolerate 1 pill without GI upset. She is taking 1 pill a day.  She is Walking daily, has lost another 2 lbs. Has never been to the diabetes education center and would like to go because she isn't quite sure which foods to eat. She is trying to do baked instead of fried foods. Pt did not bring her monitor today.     Habits & Providers  Alcohol-Tobacco-Diet     Tobacco Status: current  Current Medications (verified): 1)  Ventolin Hfa 108 (90 Base) Mcg/act Aers (Albuterol Sulfate) .... 2 Puffs As Needed Shortness of Breath. 2)  Advair Diskus 500-50 Mcg/dose  Misc (Fluticasone-Salmeterol) .Marland Kitchen.. 1 Puff in The Morning and 1 Puff in The Evening 3)  Fluoxetine Hcl 40 Mg Caps (Fluoxetine Hcl) .Marland Kitchen.. 1 Tablet By Mouth Daily 4)  Crestor 20 Mg Tabs (Rosuvastatin Calcium) .... One Tablet By Mouth Daily For High Cholesterol 5)  Anacin 81 Mg  Tbec (Aspirin) .... Take One Tablet By Mouth Daily 6)  Compression Stockings .... Wear Them For Lower Extremity Swelling As Needed 7)  Prilosec 20 Mg Cpdr (Omeprazole) .... Take One Tablet Each Morning 8)  Clonazepam 0.5 Mg Tabs (Clonazepam) .Marland Kitchen.. 1 Tab Two Times A Day Scheduled.  One Tab Mid-Day As Needed Per Dr. Tiajuana Amass. 9)  Lamictal 25 Mg Tabs (Lamotrigine) .... Titration Up To  200mg  10)  Gabapentin 300 Mg Caps (Gabapentin) .Marland Kitchen.. 1 Tab By Mouth Daily Qam For 4 Days, Then Twice A Day For 1 Week, Then 2 Pills in The Morning and 1 At Night For 2 Weeks. 11)  Onetouch Ultra Test  Strp (Glucose Blood) .... Supply Qs For Three Times Daily Test For Patient Taking Basal and Bolus Insulin. 12)  Onetouch Delica Lancets  Misc (Lancets) .... Supply Qs For Three Times Daily Testing. 13)  Albuterol Sulfate (2.5 Mg/59ml) 0.083% Nebu (Albuterol Sulfate) .... Dispense Qs For Once Daily Use. 14)  Tramadol Hcl 50 Mg Tabs (Tramadol Hcl) .... Two Times A Day For Pain 15)  Lantus 100 Unit/ml Soln (Insulin Glargine) .... Inject 50 Units Twice Daily and Increase As Instructed. Give Qs For 1 Month 16)  Novolog 100 Unit/ml Soln (Insulin Aspart) .... Take 15 Units Prior To Each Meal.  Increase To 20 Units Prior To Each Meal. Give Qs For 1 Month  Allergies (verified): 1)  ! Penicillin 2)  ! Singulair (Montelukast Sodium) 3)  ! Morphine Sulfate (Morphine Sulfate)  Social History: Reviewed history from 10/20/2008 and no changes required. Lives with husband  and 2 sons ; smokes 1/2 ppd; currently unable to work  due to limited ability to walk (neuropathy); trying to get disability. Just got a walker.  No EtoH, drugs  Review of Systems        vitals reviewed and pertinent negatives and positives seen in HPI   Physical Exam  General:  Well-developed,well-nourished,in no acute distress; alert,appropriate and cooperative throughout examination Lungs:  diffuse wheezes occasionally throughout the lungs Heart:  Normal rate and regular rhythm. S1 and S2 normal without gallop, murmur, click, rub or other extra sounds. Psych:  Cognition and judgment appear intact. Alert and cooperative with normal attention span and concentration. No apparent delusions, illusions, hallucinations   Impression & Recommendations:  Problem # 1:  DM W/NEURO MNFST, TYPE II, UNCONTROLLED (ICD-250.62) Assessment  Improved Pt's A1c is slightly better than before (11.0 instead of 11.5). Plan to increase her Lantus by 2 units for the next 5 days and if her fasting am CGB's are still > 140 will increase again by 2 units. Will see the patient in about 1 month to recheck her feet and see her log to help her make adjustments.   Her updated medication list for this problem includes:    Anacin 81 Mg Tbec (Aspirin) .Marland Kitchen... Take one tablet by mouth daily    Lantus 100 Unit/ml Soln (Insulin glargine) ..... Inject 52 units twice daily and increase in 5 days in am cbg's are still > 140. give 31 day supply    Novolog 100 Unit/ml Soln (Insulin aspart) .Marland Kitchen... Take 15 units prior to each meal.  increase to 20 units prior to each meal. give qs for 1 month  Her updated medication list for this problem includes:    Anacin 81 Mg Tbec (Aspirin) .Marland Kitchen... Take one tablet by mouth daily    Lantus 100 Unit/ml Soln (Insulin glargine) ..... Inject 50 units twice daily and increase as instructed. give qs for 1 month    Novolog 100 Unit/ml Soln (Insulin aspart) .Marland Kitchen... Take 15 units prior to each meal.  increase to 20 units prior to each meal. give qs for 1 month  Orders: [facility]1C-[facility] (86578) Diabetic Clinic Referral (Diabetic)  Complete Medication List: 1)  Ventolin Hfa 108 (90 Base) Mcg/act Aers (Albuterol sulfate) .... 2 puffs as needed shortness of breath. 2)  Advair Diskus 500-50 Mcg/dose Misc (Fluticasone-salmeterol) .Marland Kitchen.. 1 puff in the morning and 1 puff in the evening 3)  Fluoxetine Hcl 40 Mg Caps (Fluoxetine hcl) .Marland Kitchen.. 1 tablet by mouth daily 4)  Crestor 20 Mg Tabs (Rosuvastatin calcium) .... One tablet by mouth daily for high cholesterol 5)  Anacin 81 Mg Tbec (Aspirin) .... Take one tablet by mouth daily 6)  Compression Stockings  .... Wear them for lower extremity swelling as needed 7)  Prilosec 20 Mg Cpdr (Omeprazole) .... Take one tablet each morning 8)  Clonazepam 0.5 Mg Tabs (Clonazepam) .Marland Kitchen.. 1 tab two times a day scheduled.  one  tab mid-day as needed per dr. Lorin Picket cunningham. 9)  Lamictal 25 Mg Tabs (Lamotrigine) .... Titration up to 200mg  10)  Gabapentin 300 Mg Caps (Gabapentin) .Marland Kitchen.. 1 tab by mouth daily qam for 4 days, then twice a day for 1 week, then 2 pills in the morning and 1 at night for 2 weeks. 11)  Onetouch Ultra Test Strp (Glucose blood) .... Supply qs for three times daily test for patient taking basal and bolus insulin. 12)  Onetouch Delica Lancets Misc (Lancets) .... Supply qs for three times daily testing. 13)  Albuterol Sulfate (2.5 Mg/74ml) 0.083% Nebu (Albuterol sulfate) .Marland KitchenMarland KitchenMarland Kitchen  Dispense qs for once daily use. 14)  Tramadol Hcl 50 Mg Tabs (Tramadol hcl) .... Two times a day for pain 15)  Lantus 100 Unit/ml Soln (Insulin glargine) .... Inject 52 units twice daily and increase in 5 days in am cbg's are still > 140. give 31 day supply 16)  Novolog 100 Unit/ml Soln (Insulin aspart) .... Take 15 units prior to each meal.  increase to 20 units prior to each meal. give qs for 1 month  Other Orders: [facility]- Est Level  3 ([address])  Patient Instructions: 1)  You glucose is still not controlled so let's go up on your Lantus to 52 units and if your blood sugars are still above 140 in the fasting am glucose, add 2 more units after 5 days.  2)  Your A1c was 11.0, your goal is 7.0 3)  I want to see you in 1 month, after your diabetes education class and after the holidays. I want to see your log at that time.  4)  Try to stop smoking the cigars too so your lung function will improve.  Prescriptions: LANTUS 100 UNIT/ML SOLN (INSULIN GLARGINE) Inject 52 units twice daily and increase in 5 days in AM CBG's are still > 140. Give 31 day supply  #31 x 3   Entered and Authorized by:   Jamie Brookes MD   Signed by:   Jamie Brookes MD on 06/24/2010   Method used:   Electronically to        [facility] [id number] (retail)       [address]       [place], [place]  [address]       Ph: [phone] or [phone]        Fax: [phone]   RxID:   [id number]    Orders Added: 1)  A1C-[facility] [83036] 2)  Diabetic Clinic Referral [Diabetic] 3)  [facility]- Est Level  3 [03474]    Laboratory Results   Blood Tests   Date/Time Received: June 24, 2010 1:38 PM  Date/Time Reported: June 24, 2010 2:08 PM   HGBA1C: 11.0%   (Normal Range: Non-Diabetic - 3-6%   Control Diabetic - 6-8%)  Comments: ...............test performed by......Marland KitchenBonnie A. [place], MLS (ASCP)cm

## 2010-08-12 NOTE — Assessment & Plan Note (Signed)
Summary: follow up ER, ?diagnoses gallstones according to husband/ls   Vital Signs:  Patient Profile:   40 Years Old Female Height:     66.25 inches Weight:      224.4 pounds (102 kg) Temp:     97.9 degrees F (36.6 degrees C) Pulse rate:   104 / minute BP sitting:   117 / 77  (left arm)  Pt. in pain?   yes    Location:   right side at waist line around to back    Intensity:   10    Type:       sharp  Vitals Entered By: Theresia Lo RN (Nov 19, 2007 11:04 AM)              Is Patient Diabetic? Yes      PCP:  [name]  MD   History of Present Illness: HPI New Complaint (loc, qual, sev, dur, ROS):  ER follow up.  Patient presented to ER yesterday with 3 days of acute onset, 8/10 right-sided pain.  The pain is hyperesthesia to light touch, from umbilicus to vertebra, in a fixed span from iliac crest superiorly approx 4cm.  Pain is worse with movement, unchanged with eating, stooling or voiding.  No skin rash.  No bloating.  She notes no bowel movement in three days. Past history/Family History of this problem:  Poorly controlled diabetes, recurrent vaginal candidiasis.  No history malignancy, herpectic infection, prior abdominal surgery.       Current Allergies: ! PENICILLIN ! * SINGULAIR    Risk Factors:     Physical Exam  Awake, alert, no distress.  Pt is responsive and intelligible.  No icterus, jaundice or JVD.  Heart sounds are regular and without murmur, thrill or PMI displacement.  Normal work of breathing with lungs clear to auscultation and percussion.  Extremities are warm and well perfused.  Abdomen is superficially tender from umbilicus to back in a 3-4cm span ending just above the iliac crest.  No iliac crest, rib tenderness.  Just past the umbilicus, to the left, abdomen is soft, nontender and nondistended.  Normal bowel sounds.  Tenderness to vertebra from L2-S1.  No CVA tenderness.  No skin rash.  Lower extremity strength 5/5, gait intact,  no urinary or fecal incontinence.  Pelvic and rectal deferred due to severe pain on repositioning. Right LE diffuse paresthesia, chronic, noted by patient as diabetic neuropathy.    Impression & Recommendations:  Problem # 1:  BACK PAIN WITH RADICULOPATHY (ICD-729.2) Assessment: New Abdomen soft. Reviewed labs and U/S from ER.  LFTs normal, amylase normal.  U/S with CBD 5mm and some fatty infilatration of liver, otherwise normal. Dermatomal pattern of pain suggests radiculopathy, perhaps Zoster.  However, no skin rash. Advised home care and follow up in one week.  Orders: Ketorolac-Toradol 15mg  (V4098) [facility]- Est  Level 4 (11914)   Problem # 2:  CONSTIPATION (ICD-564.00) Assessment: New Slow transit likely.  No history of problems. Advised short course of Miralax. Her updated medication list for this problem includes:    Miralax Powd (Polyethylene glycol 3350) .Marland KitchenMarland KitchenMarland KitchenMarland Kitchen 17g dissolved in 8oz of water, twice a day for three days. thereafter 17g daily in 8oz water.  disp one tub.  Orders: [facility]- Est  Level 4 (99214)   Complete Medication List: 1)  Albuterol 90 Mcg/act Aers (Albuterol) .... Inhale 2 puff using inhaler every four hours 2)  Glipizide 10 Mg Tabs (Glipizide) .... Take one tablet by mouth two times a day  3)  Albuterol Sulfate (2.5 Mg/46ml) 0.083% Nebu (Albuterol sulfate) .... Per manufacturer instructions as needed wheezing 4)  Novolog Mix 70/30 70-30 % Susp (Insulin aspart prot & aspart) .... 40 units subcutaneous 30 minutes before 5pm meal and 30 units subcutaneous 30 minutes before 7am meal 5)  Advair Diskus 500-50 Mcg/dose Misc (Fluticasone-salmeterol) .Marland Kitchen.. 1 puff in the morning and 1 puff in the evening 6)  Fluoxetine Hcl 10 Mg Caps (Fluoxetine hcl) .... One tablet at 5pm daily 7)  Metformin Hcl 500 Mg Tabs (Metformin hcl) .... Take two tablets with dinner once daily 8)  Simvastatin 40 Mg Tabs (Simvastatin) .... One tablet by mouth at bedtime 9)  Anacin 81 Mg Tbec (Aspirin)  .... Take one tablet by mouth daily 10)  Meloxicam 15 Mg Tabs (Meloxicam) .Marland Kitchen.. 1 tablet by mouth daily with food 11)  Promethazine Hcl 12.5 Mg Tabs (Promethazine hcl) .Marland Kitchen.. 1-2 tablets by mouth every 6-8 hours as needed for nausea 12)  Smz-tmp Ds 800-160 Mg Tabs (Sulfamethoxazole-trimethoprim) .... Take one tablet twice a day for 5 days 13)  Fluconazole 150 Mg Tabs (Fluconazole) .... One tablet by mouth once 14)  Miralax Powd (Polyethylene glycol 3350) .Marland Kitchen.. 17g dissolved in 8oz of water, twice a day for three days. thereafter 17g daily in 8oz water.  disp one tub. 15)  Mobic 15 Mg Tabs (Meloxicam) .... One tablet daily for pain x 7 days 16)  Flexeril 5 Mg Tabs (Cyclobenzaprine hcl) .... One tablet three times a day for back pain x 7 days   Patient Instructions: 1)  I believe this is a low back strain with nerve impingement. 2)  Please take the medication I've prescribed and follow up in one week.  Sooner if you notice a rash, worsening pain.   Prescriptions: FLEXERIL 5 MG  TABS (CYCLOBENZAPRINE HCL) One tablet three times a day for back pain x 7 days  #21 x 0   Entered and Authorized by:   Towana Badger MD   Signed by:   Towana Badger MD on 11/19/2007   Method used:   Electronically sent to ...       [facility] [address]       [address]       [place], [place]  [address]       Ph: [phone]       Fax: [phone]   RxID:   [id number] MOBIC 15 MG  TABS (MELOXICAM) One tablet daily for pain x 7 days  #7 x 0   Entered and Authorized by:   Towana Badger MD   Signed by:   Towana Badger MD on 11/19/2007   Method used:   Electronically sent to ...       [facility] [address]       [address]       [place], [place]  [address]       Ph: [phone]       Fax: [phone]   RxID:   [id number] MIRALAX   POWD (POLYETHYLENE GLYCOL 3350) 17g dissolved in 8oz of water, twice a day for three days. Thereafter 17g  daily in 8oz water.  Disp one tub.  #1 x 2   Entered and Authorized by:   Towana Badger MD   Signed by:   Towana Badger MD on 11/19/2007   Method used:   Electronically  sent to ...       [facility] [address]       [address]       [place], [place]  [address]       Ph: [phone]       Fax: [phone]   RxID:   [id number]  ] VITAL SIGNS    Calculated Weight:   224.4 lb.     Temperature:     97.9 deg F.     Pulse rate:     104    Blood Pressure:   117/77 mmHg    Vital Signs:  Patient Profile:   40 Years Old Female Height:     66.25 inches Weight:      224.4 pounds (102 kg) Temp:     97.9 degrees F (36.6 degrees C) Pulse rate:   104 / minute BP sitting:   117 / 77    Location:   right side at waist line around to back    Intensity:   10    Type:       sharp                  Medication Administration  Injection # 1:    Medication: Ketorolac-Toradol 15mg     Diagnosis: BACK PAIN WITH RADICULOPATHY (ICD-729.2)    Route: IM    Site: RUOQ gluteus    Exp Date: 07/2008    Lot #: [id number]    Mfr: baxter    Comments: Toradol 60 mg given IM RUOQ    Patient tolerated injection without complications    Given by: Theresia Lo RN (Nov 19, 2007 12:06 PM)  Orders Added: 1)  Ketorolac-Toradol 15mg  [J1885] 2)  [facility]- Est  Level 4 [[address]]

## 2010-08-12 NOTE — Assessment & Plan Note (Signed)
Summary: hosp f/u per md   Vital Signs:  Patient profile:   40 year old female Height:      66.25 inches Weight:      227.3 pounds Temp:     97.8 degrees F oral Pulse rate:   98 / minute Pulse (ortho):   125 / minute BP sitting:   143 / 86  (right arm) BP standing:   171 / 95 Cuff size:   large  Vitals Entered By: Dedra Skeens CMA, (October 22, 2008 11:15 AM)  Serial Vital Signs/Assessments:  Time      Position  BP       Pulse  Resp  Temp     By 11:17 AM  Lying RA  137/85   98                    Delores Pate CMA, 11:17 AM  Sitting   143/86   98                    Delores Pate CMA, 11:17 AM  Standing  171/95   125                   Delores Pate CMA,  CC: Hosp f/u for AMS Is Patient Diabetic? No Pain Assessment Patient in pain? yes     Location: head Intensity: 7   History of Present Illness: 40yo F here for hosp f/u for AMS unk etiology  AMS: Pt was accidentally d/c'd from the hospital yesterday before she could be evaluated by Dr. Jeanie Sewer.  According to the family she has had intermittent episodes of going into a childlike state, with weakness and passing out.  It has been going on since Jan.  She has had a thorough cardiology and neurology w/u.  All have been negative without a known etiology.  She cannot have a MRI b/c of a foreign object in her scalp.  She is A&O x3, answers questions appropriately but definitely altered from her usual state.  She denies any danger to herself or others.  Still able to take her meds as well as insulin shots.  I called her last night to have her seen in the clinic today b/c I knew her family would be upset that she was discharged from the hospital.  Sure enough, her family is upset and irritated with the way things have gone.  They don't want to hear that this may be psychiatric.  They are fearful that she may be admitted to inpatient psychiatric floor.    HA: Complaining of headache not improved with ultram or tylenol.  Non migraine like.  No  N/V associated.  Recent head CT neg.  Habits & Providers     Tobacco Status: never  Allergies: 1)  ! Penicillin 2)  ! * Singulair 3)  ! * Morphine 4)  Amoxicillin (Amoxicillin) 5)  Amoxicillin (Amoxicillin)  Social History:    Smoking Status:  never  Physical Exam  General:  Pt is awake, alert, and sitting in a dazed look in her wheelchair.  She is holding a stuffed animal and acting childish.  NAD. Neurologic:  No facial droop, no slurred speech, no focal neurological deficits. Psych:  Stressing her words in a childlike fashion.  Denies suicidal or homicidal ideations.  No auditory or visual hallucinations.  Cooperative.  Not anxious or irritated.   Impression & Recommendations:  Problem # 1:  ALTERED MENTAL STATUS (ICD-780.97) Assessment Deteriorated Patient's altered  state is worsening.  She is more childlike now.  However, she is in no danger to herself or others.  I have explained thoroughly to the family to reassure them that the medical team has done everything to their abilities to look for an organic cause.  However, there are certain medical conditions that cannot be adequately tested for with our diagnostic test.  They were very grateful during the office visit.  My plan is to schedule frequent office visits with myself, q ~2wks.  Will have her f/u with Dr. Pascal Lux.  Dr. Pascal Lux has met her and gracious enough to place her in her clinic this week.  My leading differential is malingering vs. psychiatric syndrome nos.  No psych meds at this time as there are no hallucinations or agitation.     Orders: [facility]- Est  Level 4 (81191)  Problem # 2:  HEADACHE (ICD-784.0) Assessment: Unchanged Not related to current condition.  Will give quick relief here with a shot of toradol.  She has had a head CT that was negative within the past 72h.     Her updated medication list for this problem includes:    Anacin 81 Mg Tbec (Aspirin) .Marland Kitchen... Take one tablet by mouth daily    Ultram 50 Mg Tabs  (Tramadol hcl) ..... One tablet by mouth every 6 hours as needed for breakthrough pain  Orders: Admin of Therapeutic Inj  intramuscular or subcutaneous (47829) Ketorolac-Toradol 15mg  (F6213)  Complete Medication List: 1)  Albuterol 90 Mcg/act Aers (Albuterol) .... Inhale 2 puff using inhaler every four hours 2)  Albuterol Sulfate (2.5 Mg/55ml) 0.083% Nebu (Albuterol sulfate) .... Per manufacturer instructions as needed wheezing 3)  Novolog Mix 70/30 70-30 % Susp (Insulin aspart prot & aspart) .... 50 units subcutaneous 30 minutes before 5pm meal and 55 units subcutaneous 30 minutes before 7am meal 4)  Advair Diskus 500-50 Mcg/dose Misc (Fluticasone-salmeterol) .Marland Kitchen.. 1 puff in the morning and 1 puff in the evening 5)  Fluoxetine Hcl 40 Mg Caps (Fluoxetine hcl) .... One tablet by mouth daily 6)  Simvastatin 40 Mg Tabs (Simvastatin) .... One tablet by mouth at bedtime 7)  Anacin 81 Mg Tbec (Aspirin) .... Take one tablet by mouth daily 8)  Promethazine Hcl 12.5 Mg Tabs (Promethazine hcl) .Marland Kitchen.. 1-2 tablets by mouth every 6-8 hours as needed for nausea 9)  Miralax Powd (Polyethylene glycol 3350) .Marland Kitchen.. 17g dissolved in 8oz of water, twice a day for three days. thereafter 17g daily in 8oz water.  disp one tub. 10)  Compression Stockings  .... Wear them for lower extremity swelling as needed 11)  Furosemide 20 Mg Tabs (Furosemide) .... One tablet by mouth daily as needed for lower extremity swelling 12)  Ultram 50 Mg Tabs (Tramadol hcl) .... One tablet by mouth every 6 hours as needed for breakthrough pain  Patient Instructions: 1)  Please schedule a follow-up appointment in 2 weeks.  2)  Call Dr. Pascal Lux and set up an appt today. 3)  If she is a danger to herself or others, call 911 or the office immediately.   Medication Administration  Injection # 1:    Medication: Ketorolac-Toradol 15mg     Diagnosis: HEADACHE (ICD-784.0)    Route: IM    Site: R deltoid    Exp Date: 04/10/2010    Lot #:  [id number]    Mfr: NOVAPLUS    Comments: PT RECEIVED TORADOL 30 MG IM X 1    Patient tolerated injection without complications    Given  by: Dedra Skeens CMA, (October 22, 2008 12:08 PM)  Orders Added: 1)  Admin of Therapeutic Inj  intramuscular or subcutaneous [96372] 2)  Ketorolac-Toradol 15mg  [J1885] 3)  [facility]- Est  Level 4 [04540]

## 2010-08-12 NOTE — Letter (Signed)
Summary: [facility]  Pt did not keep their scheduled appt on 04/24/2007.  Hardcopy in MD box.

## 2010-08-12 NOTE — Miscellaneous (Signed)
Summary: Re: records from Dr. Donell Beers   Clinical Lists Changes Dr. Burnadette Pop  requests records from Dr. Donell Beers from recent office visits, regarding meds patient has been started on, etc. called Dr. Caprice Renshaw office and they will need a signed release of information before they will release. called patient and ask her to come by to do this and she will come sometime this week. Theresia Lo RN  May 05, 2009 2:09 PM   Appended Document: Re: records from Dr. Donell Beers patient in office and signed ROI. faxed to Dr. Donell Beers.

## 2010-08-12 NOTE — Miscellaneous (Signed)
Summary: ROI  ROI   Imported By: Clydell Hakim 05/29/2009 14:39:47  _____________________________________________________________________  External Attachment:    Type:   Image     Comment:   External Document

## 2010-08-12 NOTE — Progress Notes (Signed)
Summary: [facility]  Phone Note Other Incoming Call back at [phone] ext [phone]   Call placed by: [facility] Summary of Call: Needs diagnosis for order they just received. Initial call taken by: Haydee Salter,  March 07, 2007 2:50 PM  Follow-up for Phone Call        GIVEN ASTHMA AND COPD Follow-up by: Lillia Pauls CMA,  March 07, 2007 2:59 PM

## 2010-08-12 NOTE — Progress Notes (Signed)
Summary: Rx Req  spoke with pt. and informed her that she would need to make an appt. in order to get the refill that she needs.Marland KitchenMarland KitchenLoralee Pacas CMA  June 08, 2009 10:02 AM  Phone Note Call from Patient Call back at [facility] Phone [phone]   Caller: Patient Summary of Call: would like a rx for ultram  pt uses [facility] in [address].   Initial call taken by: Clydell Hakim,  June 08, 2009 8:46 AM  Follow-up for Phone Call        will forward message to MD. Follow-up by: Theresia Lo RN,  June 08, 2009 9:07 AM  Additional Follow-up for Phone Call Additional follow up Details #1::        Pt needs an office visit to further evaluate why she needs ultram. Additional Follow-up by: [name]  MD,  June 08, 2009 9:41 AM

## 2010-08-12 NOTE — Progress Notes (Signed)
Summary: phn msg   Phone Note Call from Patient Call back at [facility] Phone [phone]   Caller: Patient Summary of Call: pt states that Dr T called her w/ CT results Initial call taken by: De Nurse,  March 05, 2010 2:03 PM  Follow-up for Phone Call        I have not called her with results, they are negative though so can let her know this, her pain was likely due to a kidney infection.  Cont antibiotics as this antibiotic will work for her infection.  Let me know if she develops a yeast infection. Follow-up by: Rodney Langton MD,  March 05, 2010 5:27 PM  Additional Follow-up for Phone Call Additional follow up Details #1::        has appt 8/29 Additional Follow-up by: Jimmy Footman, CMA,  March 08, 2010 9:38 AM

## 2010-08-12 NOTE — Progress Notes (Signed)
Summary: phn msg   Phone Note Call from Patient Call back at [facility] Phone [phone]   Caller: Patient Summary of Call: does Dr. Burnadette Pop recall speaking to Ms. Mcconahy about getting a scooter from the [employer] she is stating that they did and also about a handicap tag.  The [employer] can send [place] the paperwork if we have record of of the conversation.  Please call Ms. Miltenberger to let her know if she needs to come back in for another app about this. Initial call taken by: Clydell Hakim,  December 19, 2008 12:10 PM  Follow-up for Phone Call        will forward to MD to advise. Follow-up by: Dedra Skeens CMA,,  December 19, 2008 12:18 PM  Additional Follow-up for Phone Call Additional follow up Details #1::        I never agreed to a scooter.  We did not discuss this.  I told her that if she gets the forms for a handicapp sticker, we can discuss the pros and cons of this action.  I am waiting to hear what the psychiatrist has to offer and diagnose before I pursue further treatments. Additional Follow-up by: [name]  MD,  December 20, 2008 7:08 PM      Appended Document: phn msg Spoke with pt via phone advising Dr Burnadette Pop will not approve a scooter because this was never discussed.  Advised Dr Burnadette Pop is awaiting info from psychiatrist in re: to diagnosis.  Pt states she can not go to see psychiatrist because it cost $75 and she does not have the money and does not foresee having it in the future.  Advised pt to schedule appt with MD to discuss scooter, but at this time he will not be approving one.  Pt agreeable ..................Marland KitchenDelores Pate-Gaddy, CMA (AAMA) {DATETIMESTAMP()}

## 2010-08-12 NOTE — Assessment & Plan Note (Signed)
Summary: Hospital H&P   Vital Signs:  Patient Profile:   40 Years Old Female Height:     66.25 inches O2 Sat:      92 % Temp:     98.4 degrees F Pulse rate:   100 / minute BP supine:   114 / 69 BP sitting:   133 / 83                 Serial Vital Signs/Assessments:  Time      Position  BP       Pulse  Resp  Temp     By 1300                133/83   107                   JESSICA TRICHE MD                                PEF    PreRx  PostRx Time      O2 Sat  O2 Type     L/min  L/min  L/min   By 1300      95  %                                     JESSICA TRICHE MD   PCP:  [name]  MD   History of Present Illness: 40 yo F discharged from [facility] on 5/20 w/ perinephric abscess s/p drainage by IR.  She returns today w/ worsening pain on R flank.  Seen by PCP last week but exam relatively  normal.  Since then pain was worsened to point of being too painful to breath deeply. Pain worse R Lower back, over side, flank, R side of ab.   No fevers but chills last night.  +N, No vomiting.  Denies dysuria but c/o burning on outside of vagina.  Took full course by mouth abx Cipro.  Refuses further IR drainage unless under sedation.    Current Allergies: ! PENICILLIN ! Sharene Butters  Past Medical History:    Reviewed history from 12/07/2007 and no changes required:       1.  Diabetes mellitus, type II       2.  h/o DVT       3.  Asthma       4.  Tobacco abuse       5.  COPD       6.  suspected uti 4/09--of note urine cx in Echart from 11/16/07 showed >100,000 colonies of mixed species; recommended recollection as indicated; no sensitivities       7.  Hospitalized 5/09- perinephric abscess s/p percutaneous drain   Family History:    Reviewed history from 11/21/2007 and no changes required:       DM, CAD, DVTs  Social History:    lives with husband  and 2 sons ; smokes 3 cigs/day; currently unable to work due to limited ability to walk (neuropathy); trying to get disability.  No  EtoH, drugs    Review of Systems       The patient complains of abdominal pain.  The patient denies anorexia, fever, weight loss, vision loss, chest pain, syncope, dyspnea on exertion, prolonged cough, headaches, melena, and hematochezia.    CV  Denies chest pain or discomfort.  Resp      Complains of pleuritic.  GI      See HPI  GU      See HPI      Denies dysuria.   Physical Exam  General:     Overweight, NAD, A&O x3 Head:     Normocephalic and atraumatic without obvious abnormalities. No apparent alopecia or balding. Eyes:     vision grossly intact and no injection.  PERRLA.  EOMI Nose:     External nasal examination shows no deformity or inflammation. Nasal mucosa are pink and moist without lesions or exudates. Mouth:     pharyngeal erythema, no exudates  Neck:     No deformities, masses, or tenderness noted. No JVD Lungs:     Diffuse wheezes.  Faint crackles RLL.  No increased wob  Pain on palpation R ribcage Heart:     Normal rate and regular rhythm. S1 and S2 normal without gallop, murmur, click, rub or other extra sounds. Abdomen:     Severe TTP R quad, over side.  Normal BS.  NO rebound guarding Msk:     normal ROM, no joint tenderness, no joint swelling, and no joint warmth.    Back:  +CVA TTP.  R flank TTP.  ?bulge in this area. L side w/o TTP Pulses:     2+ dorsalis pedal pulses Extremities:     no edema Neurologic:     No cranial nerve deficits noted. DTRs are symmetrical throughout.  Skin:      No erythema, warmth at drainage siteturgor normal, color normal, no rashes, and no suspicious lesions.   Psych:     Cognition and judgment appear intact. Alert and cooperative with normal attention span and concentration. No apparent delusions, illusions, hallucinations Additional Exam:     UA:  rare bacteria  >1000glucose, neg nitrite, neg estrase, 15 ketones, SG 1.045 Na 130 K 4.5 Gluc 359 HCO3 27, Cr 0.72 Bun 8 AST 14  ALT 19  TB 0.6 alk phos  115 cbc 14.9>11.6/15.4<478  Neutro: 84% ANC 12.5  (5/29 12.6 ANC 8.3 %66N) Lipase 16  CT scan ab/pelvis:  Small 2.4 cm r(previously 6x4cm then almost complete resolution) ecurrent right perinephric abscess and adjacent focal   pyelonephritis with surrounding retroperitoneal inflammation.    Right flank musculature and subcutaneous edema / soft tissue   thickening suspicious for adjacent developing cellulitis.  No evidence of hydronephrosis or renal obstruction.     Impression & Recommendations:  Problem # 1:  PERINEPHRIC ABSCESS (ICD-590.2) Assessment: Deteriorated Reoccurence, but smaller.  Will start empiric abx.  Vanc and Cipro as pt w/  PCN allergy.  Will call Urology in am and possibly IR for drainage.  No need to call now, can call in am.  Treat pain.  follow Cr.  NPO after midnight in case of procedure.  Can change this. Last abscess cx was MSSA.  Am CBC, BMET  Problem # 2:  PYELONEPHRITIS (ICD-590.80) Assessment: Unchanged Based on CT but UA does not correlate.   Cipro based on last wound abscess.  No other tx needed  Problem # 3:  DM W/NEURO MNFST, TYPE II, UNCONTROLLED (ICD-250.62) Assessment: Unchanged HOme meds until Midnight then SSI as pt will be NPO after midnight.  HOld home meds in am.  Metformin held as possible CT w/ IV in am.  Will resume home meds once on diet.   Her updated medication list for this problem includes:  Glipizide 10 Mg Tabs (Glipizide) .Marland Kitchen... Take one tablet by mouth two times a day    Novolog Mix 70/30 70-30 % Susp (Insulin aspart prot & aspart) .Marland KitchenMarland KitchenMarland KitchenMarland Kitchen 40 units subcutaneous 30 minutes before 5pm meal and 40 units subcutaneous 30 minutes before 7am meal    Metformin Hcl 500 Mg Tabs (Metformin hcl) .Marland Kitchen... Take two tablets with dinner once daily    Anacin 81 Mg Tbec (Aspirin) .Marland Kitchen... Take one tablet by mouth daily   Problem # 4:  COPD (ICD-496) Assessment: Unchanged Will switch to Albuterol nebs as pt w/ pain on deep inspiration Her updated medication  list for this problem includes:    Albuterol 90 Mcg/act Aers (Albuterol) ..... Inhale 2 puff using inhaler every four hours    Albuterol Sulfate (2.5 Mg/46ml) 0.083% Nebu (Albuterol sulfate) .Marland Kitchen... Per manufacturer instructions as needed wheezing    Advair Diskus 500-50 Mcg/dose Misc (Fluticasone-salmeterol) .Marland Kitchen... 1 puff in the morning and 1 puff in the evening   Problem # 5:  CONSTIPATION (ICD-564.00) Assessment: Unchanged continue home regimine Her updated medication list for this problem includes:    Miralax Powd (Polyethylene glycol 3350) .Marland KitchenMarland KitchenMarland KitchenMarland Kitchen 17g dissolved in 8oz of water, twice a day for three days. thereafter 17g daily in 8oz water.  disp one tub.   Problem # 6:  DEEP VENOUS THROMBOPHLEBITIS, HX OF (ICD-V12.52) Assessment: Unchanged Will hold off anticoagulation until after procedure if needed.   Not on coumdin currently. Her updated medication list for this problem includes:    Anacin 81 Mg Tbec (Aspirin) .Marland Kitchen... Take one tablet by mouth daily   Problem # 7:  HYPERTRIGLYCERIDEMIA (ICD-272.1) Assessment: Unchanged  Her updated medication list for this problem includes:    Simvastatin 40 Mg Tabs (Simvastatin) ..... One tablet by mouth at bedtime   Problem # 8:  ANXIETY STATE, UNSPECIFIED (ICD-300.00)  Her updated medication list for this problem includes:    Fluoxetine Hcl 10 Mg Caps (Fluoxetine hcl) ..... One tablet at 5pm daily   Complete Medication List: 1)  Albuterol 90 Mcg/act Aers (Albuterol) .... Inhale 2 puff using inhaler every four hours 2)  Glipizide 10 Mg Tabs (Glipizide) .... Take one tablet by mouth two times a day 3)  Albuterol Sulfate (2.5 Mg/44ml) 0.083% Nebu (Albuterol sulfate) .... Per manufacturer instructions as needed wheezing 4)  Novolog Mix 70/30 70-30 % Susp (Insulin aspart prot & aspart) .... 40 units subcutaneous 30 minutes before 5pm meal and 40 units subcutaneous 30 minutes before 7am meal 5)  Advair Diskus 500-50 Mcg/dose Misc  (Fluticasone-salmeterol) .Marland Kitchen.. 1 puff in the morning and 1 puff in the evening 6)  Fluoxetine Hcl 10 Mg Caps (Fluoxetine hcl) .... One tablet at 5pm daily 7)  Metformin Hcl 500 Mg Tabs (Metformin hcl) .... Take two tablets with dinner once daily 8)  Simvastatin 40 Mg Tabs (Simvastatin) .... One tablet by mouth at bedtime 9)  Anacin 81 Mg Tbec (Aspirin) .... Take one tablet by mouth daily 10)  Meloxicam 15 Mg Tabs (Meloxicam) .Marland Kitchen.. 1 tablet by mouth daily with food 11)  Promethazine Hcl 12.5 Mg Tabs (Promethazine hcl) .Marland Kitchen.. 1-2 tablets by mouth every 6-8 hours as needed for nausea 12)  Fluconazole 150 Mg Tabs (Fluconazole) .... One tablet by mouth once 13)  Miralax Powd (Polyethylene glycol 3350) .Marland Kitchen.. 17g dissolved in 8oz of water, twice a day for three days. thereafter 17g daily in 8oz water.  disp one tub. 14)  Vicodin 5-500 Mg Tabs (Hydrocodone-acetaminophen) .... One tablet every six hours for  pain.    ]

## 2010-08-12 NOTE — Assessment & Plan Note (Signed)
Summary: f/u AMS, ha   Vital Signs:  Patient profile:   40 year old female Height:      66.25 inches Weight:      229.3 pounds BMI:     36.86 Temp:     98..3 degrees F oral Pulse rate:   100 / minute BP sitting:   131 / 85  (left arm) Cuff size:   regular  Vitals Entered By: Dedra Skeens CMA, (Nov 24, 2008 3:26 PM) CC: f/u AMS Is Patient Diabetic? Yes  Pain Assessment Patient in pain? yes     Location: head Intensity: 10+    Pt able to stand up on scale today with no problems or showed any signs of wobbling.  Pt prompted by mother to sit before she fell, but pt showed no signs of falling ..................Marland KitchenDelores Pate-Gaddy, CMA (AAMA) Nov 24, 2008 3:56 PM    History of Present Illness: 40yo F here for scheduled f/u of AMS and c/o HA.  AMS: She has been alternating b/w Dr. Pascal Lux and myself for several wks.  It has been successful in keeping her out of the hospital.  Today she is accompanied by her mother.  [name] is able to answer all questions appropriately and follows commands.  She is in the wheelchair today and acting the way she did at the last visit drawing out her words and smacking her lips.  Her mother states that there are times where she will act "normal".  Today she is asking if this is "mental?"    Headache: 10/10 like last time.  Bilateral, comes on most days.  Has been using excedrin.  States that it always gets better with the shot I give her in clinic.    Habits & Providers     Tobacco Status: current     Cigarette Packs/Day: 0.5  Allergies: 1)  ! Penicillin 2)  ! * Singulair 3)  ! * Morphine 4)  Amoxicillin (Amoxicillin) 5)  Amoxicillin (Amoxicillin)  Social History:    Packs/Day:  0.5  Review of Systems      See HPI  Physical Exam  General:  Overweight white famale, A&O x3, very strange behavior with controllable lip smacking and rocking Psych:  Denies danger to self or others; oriented to place, person, and time; makes good eye  contact   Impression & Recommendations:  Problem # 1:  ALTERED MENTAL STATUS (ICD-780.97) Assessment Unchanged  The more time I spend with her the more I think that this is some type of somatoform problem more c/w malingering.  She is able to stop behaviors at certain times.  She answers questions appropriately.  Aware of symptoms and diagnosis and medications.  I'm hesistant to send her to a psychiatrist b/c I'm worried she will receive unnecessary sedating drugs.  I'm planning on talking with Dr. Pascal Lux about future treatments.  Right now, I think CBT and frequent meetings are helping and keeping her out of the hospital.  I will stretch me meetings to q 3wks.    Orders: [facility]- Est Level  3 (00938)  Problem # 2:  HEADACHE (ICD-784.0) Assessment: Unchanged  Pt has chronic headaches that seems more like tension headaches then migraines.  I was hoping the fluoxetine inc would help.  After discussing the patient with Dr. Leveda Anna, he recommended short supply (5 days) of ketorolac for headaches.  Patient pleased with this treatment.  She is already on Ultram.  I'm worried that she may develop an addiction.  Also noticed a  mistake in the prescription for Ultram at the last visit on 4/29.  Instead of #20 dispensed, it was #320.  I've called the pharmacy and they have already dispensed it.  Nothing can be done at this point.  Will contact the patient and f/u to notify her of the mistake.     Her updated medication list for this problem includes:    Anacin 81 Mg Tbec (Aspirin) .Marland Kitchen... Take one tablet by mouth daily    Ultram 50 Mg Tabs (Tramadol hcl) ..... One tablet by mouth every 6 hours as needed for breakthrough pain    Ketorolac Tromethamine 10 Mg Tabs (Ketorolac tromethamine) ..... One tablet every 6 hours as needed for severe headache  Orders: [facility]- Est Level  3 (16109)  Complete Medication List: 1)  Albuterol 90 Mcg/act Aers (Albuterol) .... Inhale 2 puff using inhaler every four hours 2)   Albuterol Sulfate (2.5 Mg/63ml) 0.083% Nebu (Albuterol sulfate) .... Per manufacturer instructions as needed wheezing 3)  Novolog Mix 70/30 70-30 % Susp (Insulin aspart prot & aspart) .... 50 units subcutaneous 30 minutes before 5pm meal and 55 units subcutaneous 30 minutes before 7am meal 4)  Advair Diskus 500-50 Mcg/dose Misc (Fluticasone-salmeterol) .Marland Kitchen.. 1 puff in the morning and 1 puff in the evening 5)  Fluoxetine Hcl 40 Mg Caps (Fluoxetine hcl) .... 2 tablets by mouth daily 6)  Simvastatin 40 Mg Tabs (Simvastatin) .... One tablet by mouth at bedtime 7)  Anacin 81 Mg Tbec (Aspirin) .... Take one tablet by mouth daily 8)  Miralax Powd (Polyethylene glycol 3350) .Marland Kitchen.. 17g dissolved in 8oz of water, twice a day for three days. thereafter 17g daily in 8oz water.  disp one tub. 9)  Compression Stockings  .... Wear them for lower extremity swelling as needed 10)  Furosemide 20 Mg Tabs (Furosemide) .... One tablet by mouth daily as needed for lower extremity swelling 11)  Ultram 50 Mg Tabs (Tramadol hcl) .... One tablet by mouth every 6 hours as needed for breakthrough pain 12)  Ketorolac Tromethamine 10 Mg Tabs (Ketorolac tromethamine) .... One tablet every 6 hours as needed for severe headache  Other Orders: [facility] (60454)    Patient Instructions: 1)  Schedule follow up appt in 3 weeks. 2)  I gave you a medication to help with the severe headaches.   Prescriptions: KETOROLAC TROMETHAMINE 10 MG TABS (KETOROLAC TROMETHAMINE) one tablet every 6 hours as needed for severe headache  #20 x 0   Entered and Authorized by:   [name]  MD   Signed by:   [name]  MD on 11/24/2008   Method used:   Electronically to        [facility] [address] (retail)       [address], [address]       [place], [place]  [address]       Ph: [phone]       Fax: [phone]   RxID:   [id number]   Laboratory Results   Blood Tests   Date/Time Received: Nov 24, 2008 3:34 PM  Date/Time Reported: Nov 24, 2008 3:42 PM   HGBA1C: 11.3%   (Normal Range: Non-Diabetic - 3-6%   Control Diabetic - 6-8%)  Comments: ...........test performed by...........Marland KitchenTerese Door, CMA

## 2010-08-12 NOTE — Progress Notes (Signed)
Summary: WI request  Phone Note Call from Patient Call back at Home Phone [phone]   Summary of Call: Pt states she was just seen for foot pain and wants to know if we can prescribe something for the pain. Initial call taken by: Haydee Salter,  May 23, 2007 10:35 AM  Follow-up for Phone Call        was seen several months ago by pcp & advised to use aleve. states it is not helping anymore & the pain is now in both feet & legs. wanted tylenol #3. discussed neuropathic pain & diabetes. made appt for 3:30 today. pcp not available Follow-up by: Golden Circle RN,  May 23, 2007 10:45 AM

## 2010-08-12 NOTE — Progress Notes (Signed)
Summary: pt requesting to speak with Dr. Mannie Stabile before end of day  Phone Note Call from Patient Call back at Home Phone [phone]   Reason for Call: Talk to Doctor Summary of Call: pt is requesting to speak with Dr. Mannie Stabile, sts she received the results of her x-rays and is not happy that no one can figure out whats wrong with her, she sts she knows its her gal bladder and she wants to know who else she can go to? Initial call taken by: Knox Royalty,  Nov 21, 2007 4:53 PM  Follow-up for Phone Call        will forward to Dr Mannie Stabile Follow-up by: Alphia Kava,  Nov 21, 2007 5:05 PM  Additional Follow-up for Phone Call Additional follow up Details #1::        Phone Call Completed.  Reassured patient. Additional Follow-up by: Towana Badger MD,  Nov 21, 2007 5:18 PM

## 2010-08-12 NOTE — Assessment & Plan Note (Signed)
Summary: routine visit/med refills/el  Medications Added FLUCONAZOLE 150 MG  TABS (FLUCONAZOLE) Take one tablet once LANTUS 100 UNIT/ML  SOLN (INSULIN GLARGINE) Start at 44units at bedtime and increase by 2 units until morning goal of 90-120 is reached ALBUTEROL 90 MCG/ACT AERS (ALBUTEROL) Inhale 2 puff using inhaler every four hours        Vital Signs:  Patient Profile:   40 Years Old Female Weight:      219 pounds Temp:     97.7 degrees F oral Pulse rate:   100 / minute BP sitting:   131 / 87  (right arm)  Vitals Entered By: Arlyss Repress CMA, (February 27, 2007 3:21 PM)             Is Patient Diabetic? Yes    PCP:  [name]  MD  Chief Complaint:  f/up UCC. nausea and headache and dizziness x 4 days.Marland Kitchen  History of Present Illness: Farin is a 40 yo WF h/o uncontrolled DM II with recent ED and Urgent Care visit for c/o dizziness, headaches, polyruia, and polydipsia.  She presents to the clinic today b/c her symptoms continue.  She reports that her symptoms initially began with N/V and diarrhea.  It eventually resolved and led to her polyuria and polydipsia.  This later was associated with the headaches and dizziness.  She initially went to the ED on 8/17 where she had a head CT, UA, I-STAT which were all nl except a glucose of 329 and a urine glucose >1000.  Her recent morning fasting glucose readings have been 178, 295, 250, 153.  She takes 44 units of lantus each night.  She failed a trial of metformin and glucophage 2/2 GI side effects.  Besides the gastrointestinal infection, she has no other recent illnesses.  Current Allergies: No known allergies   Past Medical History:    Diabetes mellitus, type II   Social History:    lives with husban and sons, unemployed due to asthma; smokes 1/2ppd    Review of Systems  The patient denies fever, weight loss, vision loss, and abdominal pain.     Physical Exam  General:     Well-developed,well-nourished,in no acute  distress; alert,appropriate and cooperative throughout examination Lungs:     normal respiratory effort, no intercostal retractions, no accessory muscle use, R wheezes, and L wheezes.   Heart:     Normal rate and regular rhythm. S1 and S2 normal without gallop, murmur, click, rub or other extra sounds. Abdomen:     Bowel sounds positive,abdomen soft and non-tender without masses, organomegaly or hernias noted. Pulses:     R and L carotid,radial,femoral,dorsalis pedis and posterior tibial pulses are full and equal bilaterally Extremities:     No clubbing, cyanosis, edema, or deformity noted with normal full range of motion of all joints.   Psych:     Cognition and judgment appear intact. Alert and cooperative with normal attention span and concentration. No apparent delusions, illusions, hallucinations    Impression & Recommendations:  Problem # 1:  DM W/MANIFESTATION NEC, TYPE II, UNCONTROLLED (ICD-250.82) Assessment: Deteriorated Hgb A1c 11.8.  Pt's symptoms most likely attributed to hyperglycemia.  Most likely gastroenteritis stimulated a surge of glucose levels which created a cycle of polyuria and polydipsia and subsequent headaches and dizziness.  Plan is to control glucose levels both with increases in lantus and change in lifestyle.  She will increase her nighttime lantus by 2 units to reach a morning fasting glucose of 90-120.  She will f/u in 1 week and I will refer her to a nutritionist and develop and exercise plan.  Will not start her on oral meds since she had a negative experience during her first trial.  Patient was very satisfied and optimistic with the new plan.  Her updated medication list for this problem includes:    Lantus 100 Unit/ml Soln (Insulin glargine) ..... Start at 44units at bedtime and increase by 2 units until morning goal of 90-120 is reached  Orders: [facility]- Est  Level 4 (99214)   Problem # 2:  CANDIDIASIS, VAGINAL (ICD-112.1) Assessment: New Pt c/o  irritation around the labium while urinating and experiencing white frothy discharge similar to recent yeast infxn.  Will treat with one time dose of Fluconazole.  Her updated medication list for this problem includes:    Fluconazole 150 Mg Tabs (Fluconazole) .Marland Kitchen... Take one tablet once  Orders: [facility]- Est  Level 4 (36644)   Complete Medication List: 1)  Fluconazole 150 Mg Tabs (Fluconazole) .... Take one tablet once 2)  Lantus 100 Unit/ml Soln (Insulin glargine) .... Start at 44units at bedtime and increase by 2 units until morning goal of 90-120 is reached 3)  Albuterol 90 Mcg/act Aers (Albuterol) .... Inhale 2 puff using inhaler every four hours  Other Orders: [facility] ([id number])   Patient Instructions: 1)  Goal of Fasting blood sugar: 90-120 2)  Increase nighttime lantus by 2 units until you reach goal 3)  Please keep daily morning record of fasting glucose level 4)  Follow-up in 1 week    Prescriptions: LANTUS 100 UNIT/ML  SOLN (INSULIN GLARGINE) Start at 44units at bedtime and increase by 2 units until morning goal of 90-120 is reached  #Q.S. x 1   Entered and Authorized by:   [name]  MD   Signed by:   [name]  MD on 02/27/2007   Method used:   Electronically sent to ...       [facility]       [address], [address]       [place], [place]  [address]       Ph: [phone]       Fax: [phone]   RxID:   [id number] FLUCONAZOLE 150 MG  TABS (FLUCONAZOLE) Take one tablet once  #1 x 0   Entered and Authorized by:   [name]  MD   Signed by:   [name]  MD on 02/27/2007   Method used:   Electronically sent to ...       [facility]       [address], [address]       [place], [place]  [address]       Ph: [phone]       Fax: [phone]   RxID:   [id number]   Laboratory Results   Blood Tests   Date/Time Recieved: February 27, 2007 3:25   PM  Date/Time Reported: February 27, 2007 3:33 PM   HGBA1C: 11.8%   (Normal Range: Non-Diabetic - 3-6%   Control Diabetic - 6-8%)  Comments: ...............test performed by......Marland KitchenBonnie A. [place], [place] (ASCP)

## 2010-08-12 NOTE — Assessment & Plan Note (Signed)
Summary: "running in my head"[place]   Vital Signs:  Patient profile:   40 year old female Height:      66.25 inches Weight:      227.9 pounds Temp:     97.4 degrees F Pulse rate:   94 / minute BP sitting:   164 / 94  (left arm)  Vitals Entered By: Theresia Lo RN (April 28, 2009 9:42 AM)  Serial Vital Signs/Assessments:  Time      Position  BP       Pulse  Resp  Temp     By                     142/82                         Asher Muir MD  CC: feels like water running thru head Is Patient Diabetic? Yes  Pain Assessment Patient in pain? yes     Location: head Intensity: 6 Type: aching   Primary Care Provider:  [name]  MD  CC:  feels like water running thru head.  History of Present Illness: Pt here for sda for:  1.  sensation that "water is running through my head"--has had this problem for about 1 year, but it has gotten worse over the past week.  has sensation of hot water running over the left side of her head.  has discrete episodes that last 5-10 minutes.  she has about 2 episodes per day.  during these episodes her left mouth turns up.  she often has a headache, perspiration during these episodes.  stressful situations tend to provoke the episodes.  going to sleep and tylenol help to relieve the episodes.  afterwards she is tired.  she denies focal weakness or numbness during the episodes.    although the pt does not offer this info, she was hospitalized in April 2010 with similar symptoms.  she had a normal head CT and otherwise normal work-up at that time.    Current Medications (verified): 1)  Albuterol 90 Mcg/act Aers (Albuterol) .... Inhale 2 Puff Using Inhaler Every Four Hours W/ Spacer Please Disp Spacer As Well 2)  Novolog Mix 70/30 70-30 %  Susp (Insulin Aspart Prot & Aspart) .... 50 Units Subcutaneous 30 Minutes Before 5pm Meal and 55 Units Subcutaneous 30 Minutes Before 7am Meal 3)  Advair Diskus 500-50 Mcg/dose  Misc  (Fluticasone-Salmeterol) .Marland Kitchen.. 1 Puff in The Morning and 1 Puff in The Evening 4)  Fluoxetine Hcl 40 Mg Caps (Fluoxetine Hcl) .Marland Kitchen.. 1 Tablet By Mouth Daily 5)  Simvastatin 40 Mg  Tabs (Simvastatin) .... One Tablet By Mouth At Bedtime 6)  Anacin 81 Mg  Tbec (Aspirin) .... Take One Tablet By Mouth Daily 7)  Miralax   Powd (Polyethylene Glycol 3350) .Marland Kitchen.. 17g Dissolved in 8oz of Water, Twice A Day For Three Days. Thereafter 17g Daily in [employer].  Disp One Tub. 8)  Compression Stockings .... Wear Them For Lower Extremity Swelling As Needed 9)  Prilosec 20 Mg Cpdr (Omeprazole) .... Take One Tablet Each Morning 10)  Clonazepam 0.5 Mg Tabs (Clonazepam) .Marland Kitchen.. 1 Tab Two Times A Day Scheduled.  One Tab Mid-Day As Needed Per Dr. Donell Beers (Psych) 11)  Tramadol Hcl 50 Mg Tabs (Tramadol Hcl) .Marland Kitchen.. 1 By Mouth Q 6 Hours As Needed Breakthrough Pain  Allergies: 1)  ! Penicillin 2)  ! * Singulair 3)  ! * Morphine 4)  Amoxicillin (Amoxicillin) 5)  Amoxicillin (Amoxicillin)  Physical Exam  General:  VS reviewed.  Non ill appearing, speech is slow Head:  Normocephalic and atraumatic without obvious abnormalities. No apparent alopecia or balding. Eyes:  PERRL; EOMI Mouth:  Oral mucosa and oropharynx without lesions or exudates.   Neurologic:  alert and oriented; major CNs intact, except she states she has no feeling on the left side of her face;  has a shuffling gait.  difficulty with toe and heel walking.  diffiiculty with heel-to-toe walking.  is able to get up on exam table.  right finger to nose normal, but some past-pointing on left.  rapid alt movements normal on right, but slow on left.  upper and lower ext strength is 5/5 on right.  on the left it is 4+/5, except foot flexion is 5/5.  also deltoid strength 5/5 initially, but then she gives way Psych:  her affect is quite odd and her speech is somewhat slow, but intelligible Additional Exam:  vital signs reviewed    Impression & Recommendations:  Problem  # 1:  FACIAL PARESTHESIA, LEFT (ICD-782.0) Assessment Unchanged  I do not know exactly what this constellation of symptoms is.  Discussed case with Dr. Leveda Anna.  this has been a problem for >1 year.  she has had a normal head CT about 6 months ago.  I suspect that the left hemiplegia is not real.  her dorsiflexion was normal on the left and her deltoid strength was initially normal, the she gave way.  I do not think that she is having a stroke or entity that requires an emergent work-up.  Based on previous notes, it sounds like there is a strong suspicion for somatoform disorder.  I explained to the pt that since these symptoms are chronic and there is not likely an easily discovered diagnosis, this is best dealt with with her primary physician.    Orders: [facility]- Est Level  3 (16109)  Problem # 2:  left hemiplegia Assessment: Unchanged see discussion above  Complete Medication List: 1)  Albuterol 90 Mcg/act Aers (Albuterol) .... Inhale 2 puff using inhaler every four hours w/ spacer please disp spacer as well 2)  Novolog Mix 70/30 70-30 % Susp (Insulin aspart prot & aspart) .... 50 units subcutaneous 30 minutes before 5pm meal and 55 units subcutaneous 30 minutes before 7am meal 3)  Advair Diskus 500-50 Mcg/dose Misc (Fluticasone-salmeterol) .Marland Kitchen.. 1 puff in the morning and 1 puff in the evening 4)  Fluoxetine Hcl 40 Mg Caps (Fluoxetine hcl) .Marland Kitchen.. 1 tablet by mouth daily 5)  Simvastatin 40 Mg Tabs (Simvastatin) .... One tablet by mouth at bedtime 6)  Anacin 81 Mg Tbec (Aspirin) .... Take one tablet by mouth daily 7)  Miralax Powd (Polyethylene glycol 3350) .Marland Kitchen.. 17g dissolved in 8oz of water, twice a day for three days. thereafter 17g daily in 8oz water.  disp one tub. 8)  Compression Stockings  .... Wear them for lower extremity swelling as needed 9)  Prilosec 20 Mg Cpdr (Omeprazole) .... Take one tablet each morning 10)  Clonazepam 0.5 Mg Tabs (Clonazepam) .Marland Kitchen.. 1 tab two times a day scheduled.  one  tab mid-day as needed per dr. Donell Beers (psych) 11)  Tramadol Hcl 50 Mg Tabs (Tramadol hcl) .Marland Kitchen.. 1 by mouth q 6 hours as needed breakthrough pain  Patient Instructions: 1)  It was nice to see you today. 2)  You had a normal cat scan of your head in April since these symptoms have started.  I don't think are having a stroke. 3)  I think the most important thing you can do is to follow up with Dr. Burnadette Pop at his first available appointment.

## 2010-08-12 NOTE — Assessment & Plan Note (Signed)
Summary: f/u altered behaviors   Vital Signs:  Patient profile:   40 year old female Weight:      228 pounds Temp:     98.8 degrees F oral Pulse rate:   102 / minute BP sitting:   125 / 80  (left arm)  Vitals Entered By: Alphia Kava (December 15, 2008 4:04 PM) CC: f/u altered behavior Is Patient Diabetic? Yes   Primary Care Provider:  [name]  MD  CC:  f/u altered behavior.  History of Present Illness: 40yo WF f/u of altered behavior  Altered Behavior: Today she is accompanied by her brother who states that he thinks she is improving but still having abnormal spells where she has abnormal physical movements but is able to answer appropriately.  He also states that there are times where she is able to exercise, walk, and coverse normally.  She has yet to call Dr. Donell Beers.  She is taking her medications as prescribed.  Still able to clean herself, dress herself, go to the bathroom, and feed herself.    Headaches: Improved.  Still has occasional headaches w/o N/V.  Habits & Providers  Alcohol-Tobacco-Diet     Tobacco Status: current     Cigarette Packs/Day: 0.5  Allergies: 1)  ! Penicillin 2)  ! * Singulair 3)  ! * Morphine 4)  Amoxicillin (Amoxicillin) 5)  Amoxicillin (Amoxicillin)  Review of Systems      See HPI  Physical Exam  General:  Pt sitting in wheelchair, acknowledges who I am, answers questions appropriately, oriented to place, person, and time, but speaking in a childish voice and unable to sit still Psych:  No SI/HI.     Impression & Recommendations:  Problem # 1:  ALTERED MENTAL STATUS (ICD-780.97) Assessment Unchanged It is becoming more apparent that this is most likely malingering b/c of the stories that she is able to function normally at home at certain times.  During our conversations, they will bring up issues such as handicap parking and attorneys.  I cannot completely rule out patholocial process such as psychiatric.  She is in no danger  to herself or others at this time.  Our frequent visits have kept her out of the hospital.  I will give her a chance to meet with Dr. Donell Beers and get his recommendations and see her back after that meeting.   Orders: [facility]- Est Level  3 (04540)  Problem # 2:  HEADACHE (ICD-784.0) Assessment: Improved Frequency and intensity of headaches seem to improve.  No changes to regimen at this time.   Her updated medication list for this problem includes:    Anacin 81 Mg Tbec (Aspirin) .Marland Kitchen... Take one tablet by mouth daily    Ultram 50 Mg Tabs (Tramadol hcl) ..... One tablet by mouth every 6 hours as needed for breakthrough pain    Ketorolac Tromethamine 10 Mg Tabs (Ketorolac tromethamine) ..... One tablet every 6 hours as needed for severe headache  Problem # 3:  Preventive Health Care (ICD-V70.0) Assessment: Comment Only It has been difficult to assess her other chronic medical problems while she is having altered behaviors.    Complete Medication List: 1)  Albuterol 90 Mcg/act Aers (Albuterol) .... Inhale 2 puff using inhaler every four hours 2)  Albuterol Sulfate (2.5 Mg/77ml) 0.083% Nebu (Albuterol sulfate) .... Per manufacturer instructions as needed wheezing 3)  Novolog Mix 70/30 70-30 % Susp (Insulin aspart prot & aspart) .... 50 units subcutaneous 30 minutes before 5pm meal and 55  units subcutaneous 30 minutes before 7am meal 4)  Advair Diskus 500-50 Mcg/dose Misc (Fluticasone-salmeterol) .Marland Kitchen.. 1 puff in the morning and 1 puff in the evening 5)  Fluoxetine Hcl 40 Mg Caps (Fluoxetine hcl) .... 2 tablets by mouth daily 6)  Simvastatin 40 Mg Tabs (Simvastatin) .... One tablet by mouth at bedtime 7)  Anacin 81 Mg Tbec (Aspirin) .... Take one tablet by mouth daily 8)  Miralax Powd (Polyethylene glycol 3350) .Marland Kitchen.. 17g dissolved in 8oz of water, twice a day for three days. thereafter 17g daily in 8oz water.  disp one tub. 9)  Compression Stockings  .... Wear them for lower extremity swelling as  needed 10)  Furosemide 20 Mg Tabs (Furosemide) .... One tablet by mouth daily as needed for lower extremity swelling 11)  Ultram 50 Mg Tabs (Tramadol hcl) .... One tablet by mouth every 6 hours as needed for breakthrough pain 12)  Ketorolac Tromethamine 10 Mg Tabs (Ketorolac tromethamine) .... One tablet every 6 hours as needed for severe headache  Patient Instructions: 1)  Make an appt with Archer Asa, psychiatrist.  Phone number is [phone].   2)  Let us know when your appt is. Prescriptions: FLUOXETINE HCL 40 MG CAPS (FLUOXETINE HCL) 2 tablets by mouth daily  #120 x 1   Entered and Authorized by:   [name]  MD   Signed by:   [name]  MD on 12/15/2008   Method used:   Electronically to        [facility] [address] (retail)       [address]       [place], [place]  [address]       Ph: [phone]       Fax: [phone]   RxID:   [id number]

## 2010-08-12 NOTE — Assessment & Plan Note (Signed)
Summary: hospital f/u for recurrent right perinephric abscess   Vital Signs:  Patient Profile:   40 Years Old Female Height:     66.25 inches Weight:      217.8 pounds BMI:     35.02 Temp:     97 degrees F Pulse rate:   90 / minute BP sitting:   121 / 80  Pt. in pain?   yes    Location:   rt side    Intensity:   7  Vitals Entered By: Starleen Blue RN (December 24, 2007 3:12 PM)                  PCP:  [name]  MD  Chief Complaint:  hospital f/u for recurrent right perinephric abscess.  History of Present Illness: Recurrent perinephric abscess: Admitted to [facility] on 12/13/07 and d/c'd on 12/19/07.  She was consulted on by both Dr. Lowella Dandy (IVR) and Dr. Brunilda Payor (urology).  She had a JP drain placed and is still currently in place.  She was d/c'd on Cipro and I switched her to Bactrim when she started having GI side effects from the cipro.  She is still currently taking the medication.  She was seen by Dr. Brunilda Payor today who told her that he wanted to repeat a CT scan and leave the drain in place for now.  She denies any fever or chills and reports pain with movement.  It is improved with lying still and currently on percocet for pain.  She reports less than 20cc of fluid in her drain per day.  She has home health nurses that help her at home.  She denies any dysuria or hematuria.      Current Allergies: ! PENICILLIN ! * SINGULAIR     Review of Systems      See HPI   Physical Exam  General:     Well appearing, overwight WF, NAD Heart:     Normal rate and regular rhythm. S1 and S2 normal without gallop, murmur, click, rub or other extra sounds. Msk:     JP drain in place right lower back.  No signs of erythema, edema, or pus around the drain site.  Nontender to light palpation.  Drain does not appear to be blocked.  She has serosanguinous fluid in the drain. Psych:     Understanding is less than normal.    Impression & Recommendations:  Problem # 1:  PERINEPHRIC ABSCESS  (ICD-590.2) Assessment: Comment Only Resolving.  She has been afebrile and seems to be responding to the abx.  Plan to finish the course of bactrim and will f/u with Dr. Brunilda Payor for repeat CT scan.  Goal is to control her pain and control her blood sugars.  I have instructed her to continue her novolog 70/30 twice daily even when she thinks her blood sugars are under control.  Will provide percocet for pain control with instructions to use for breakthrough pain only.   Orders: [facility]- Est Level  3 (64403)   Complete Medication List: 1)  Albuterol 90 Mcg/act Aers (Albuterol) .... Inhale 2 puff using inhaler every four hours 2)  Glipizide 10 Mg Tabs (Glipizide) .... Take one tablet by mouth two times a day 3)  Albuterol Sulfate (2.5 Mg/27ml) 0.083% Nebu (Albuterol sulfate) .... Per manufacturer instructions as needed wheezing 4)  Novolog Mix 70/30 70-30 % Susp (Insulin aspart prot & aspart) .... 40 units subcutaneous 30 minutes before 5pm meal and 40 units subcutaneous 30 minutes before  7am meal 5)  Advair Diskus 500-50 Mcg/dose Misc (Fluticasone-salmeterol) .Marland Kitchen.. 1 puff in the morning and 1 puff in the evening 6)  Fluoxetine Hcl 10 Mg Caps (Fluoxetine hcl) .... One tablet at 5pm daily 7)  Metformin Hcl 500 Mg Tabs (Metformin hcl) .... Take two tablets with dinner once daily 8)  Simvastatin 40 Mg Tabs (Simvastatin) .... One tablet by mouth at bedtime 9)  Anacin 81 Mg Tbec (Aspirin) .... Take one tablet by mouth daily 10)  Meloxicam 15 Mg Tabs (Meloxicam) .Marland Kitchen.. 1 tablet by mouth daily with food 11)  Promethazine Hcl 12.5 Mg Tabs (Promethazine hcl) .Marland Kitchen.. 1-2 tablets by mouth every 6-8 hours as needed for nausea 12)  Fluconazole 150 Mg Tabs (Fluconazole) .... One tablet by mouth once 13)  Miralax Powd (Polyethylene glycol 3350) .Marland Kitchen.. 17g dissolved in 8oz of water, twice a day for three days. thereafter 17g daily in 8oz water.  disp one tub. 14)  Vicodin 5-500 Mg Tabs (Hydrocodone-acetaminophen) .... One  tablet every six hours for pain. 15)  Bactrim Ds 800-160 Mg Tabs (Sulfamethoxazole-trimethoprim) .... One tablet by mouth two times a day x7 days 16)  Percocet 5-325 Mg Tabs (Oxycodone-acetaminophen) .... Take one tablet by mouth q4-6hrs as needed pain   Patient Instructions: 1)  Please schedule a follow-up appointment as needed. 2)  Please ask Dr. Julien Girt to send his records to [place]. 3)  Call the office if your pain worsens, if you have fevers/chills, or worsening symptoms.   4)  We will for the CT report. 5)  Continue to take your diabetes medications as prescribed.   Prescriptions: PERCOCET 5-325 MG  TABS (OXYCODONE-ACETAMINOPHEN) take one tablet by mouth q4-6hrs as needed pain  #28 x 0   Entered and Authorized by:   [name]  MD   Signed by:   [name]  MD on 12/24/2007   Method used:   Handwritten   RxID:   [id number]  ]

## 2010-08-12 NOTE — Assessment & Plan Note (Signed)
Summary: burst boil under arm. red.painful   Vital Signs:  Patient profile:   40 year old female Weight:      233.4 pounds Temp:     98.4 degrees F oral Pulse rate:   97 / minute BP sitting:   119 / 81  (left arm)  Vitals Entered By: Johney Maine MD (October 03, 2008 10:00 AM)  CC: boil Is Patient Diabetic? Yes   History of Present Illness: 1 week of boil under R axilla.  Was bigger and swollen. Yesterday squeezed open accidentally by husband and large amount of discharge came out.  Brownish, white.  Continued to leak and was leaknig this am.  Since open, area now smaller w/ less redness.  Still w/ tender to palpation.  Has had boils before.  Tape irriated skin around area.    Habits & Providers     Tobacco Status: current     Cigarette Packs/Day: 0.5  Current Medications (verified): 1)  Albuterol 90 Mcg/act Aers (Albuterol) .... Inhale 2 Puff Using Inhaler Every Four Hours 2)  Albuterol Sulfate (2.5 Mg/36ml) 0.083%  Nebu (Albuterol Sulfate) .... Per Manufacturer Instructions As Needed Wheezing 3)  Novolog Mix 70/30 70-30 %  Susp (Insulin Aspart Prot & Aspart) .... 55 Units Subcutaneous 30 Minutes Before 5pm Meal and 50 Units Subcutaneous 30 Minutes Before 7am Meal 4)  Advair Diskus 500-50 Mcg/dose  Misc (Fluticasone-Salmeterol) .Marland Kitchen.. 1 Puff in The Morning and 1 Puff in The Evening 5)  Fluoxetine Hcl 40 Mg Caps (Fluoxetine Hcl) .... One Tablet By Mouth Daily 6)  Simvastatin 40 Mg  Tabs (Simvastatin) .... One Tablet By Mouth At Bedtime 7)  Anacin 81 Mg  Tbec (Aspirin) .... Take One Tablet By Mouth Daily 8)  Promethazine Hcl 12.5 Mg  Tabs (Promethazine Hcl) .Marland Kitchen.. 1-2 Tablets By Mouth Every 6-8 Hours As Needed For Nausea 9)  Miralax   Powd (Polyethylene Glycol 3350) .Marland Kitchen.. 17g Dissolved in 8oz of Water, Twice A Day For Three Days. Thereafter 17g Daily in [employer].  Disp One Tub. 10)  Compression Stockings .... Wear Them For Lower Extremity Swelling As Needed 11)  Furosemide 20 Mg Tabs  (Furosemide) .... One Tablet By Mouth Daily As Needed For Lower Extremity Swelling 12)  Ultram 50 Mg Tabs (Tramadol Hcl) .... One Tablet By Mouth Every 6 Hours As Needed For Breakthrough Pain 13)  Doxycycline Hyclate 100 Mg Caps (Doxycycline Hyclate) .Marland Kitchen.. 1 Tab By Mouth Two Times A Day For 7 Days  Allergies (verified): 1)  ! Penicillin 2)  ! * Singulair  Social History:    Packs/Day:  0.5  Review of Systems      See HPI  Physical Exam  Skin:  R axilla:  5x3cm area on induration.  Small area of faint erythema anterior to this induration. no streaking.  No warmth.  No fluctuance.  In center of area is what appears to be an opening that is now covered w/ thin film. Able to express clear fluid from area. Area mod tender to palpation.     Impression & Recommendations:  Problem # 1:  ABSCESS, AXILLA, RIGHT (ICD-682.3) Assessment New  Small and appears to be resolving. Given fact that it is draining on own and no areas of fluctuance, plus patient preference, will NOT I&D today.  Start doxy for presumed MRSA.  Also patient incidentally w/ COPD and cough, so could cover if acute exacerbation.  Hot compresses QID.  Red flags to return.  See patient instructions for plan .  close f/u if no improvement.  Instructed to manage sugars w/ tight control.   Her updated medication list for this problem includes:    Doxycycline Hyclate 100 Mg Caps (Doxycycline hyclate) .Marland Kitchen... 1 tab by mouth two times a day for 7 days  Orders: [facility]- Est Level  3 (59563)  Complete Medication List: 1)  Albuterol 90 Mcg/act Aers (Albuterol) .... Inhale 2 puff using inhaler every four hours 2)  Albuterol Sulfate (2.5 Mg/22ml) 0.083% Nebu (Albuterol sulfate) .... Per manufacturer instructions as needed wheezing 3)  Novolog Mix 70/30 70-30 % Susp (Insulin aspart prot & aspart) .... 55 units subcutaneous 30 minutes before 5pm meal and 50 units subcutaneous 30 minutes before 7am meal 4)  Advair Diskus 500-50 Mcg/dose Misc  (Fluticasone-salmeterol) .Marland Kitchen.. 1 puff in the morning and 1 puff in the evening 5)  Fluoxetine Hcl 40 Mg Caps (Fluoxetine hcl) .... One tablet by mouth daily 6)  Simvastatin 40 Mg Tabs (Simvastatin) .... One tablet by mouth at bedtime 7)  Anacin 81 Mg Tbec (Aspirin) .... Take one tablet by mouth daily 8)  Promethazine Hcl 12.5 Mg Tabs (Promethazine hcl) .Marland Kitchen.. 1-2 tablets by mouth every 6-8 hours as needed for nausea 9)  Miralax Powd (Polyethylene glycol 3350) .Marland Kitchen.. 17g dissolved in 8oz of water, twice a day for three days. thereafter 17g daily in 8oz water.  disp one tub. 10)  Compression Stockings  .... Wear them for lower extremity swelling as needed 11)  Furosemide 20 Mg Tabs (Furosemide) .... One tablet by mouth daily as needed for lower extremity swelling 12)  Ultram 50 Mg Tabs (Tramadol hcl) .... One tablet by mouth every 6 hours as needed for breakthrough pain 13)  Doxycycline Hyclate 100 Mg Caps (Doxycycline hyclate) .Marland Kitchen.. 1 tab by mouth two times a day for 7 days  Patient Instructions: 1)  f/u Monday if your infection is not better 2)  If you get fevers or the red area becomes a lot larger, go to Urgent Care over the weekend 3)  Use hot compresses 4 times a day 4)  START DOXYCYCLINE two times a day FOR A WEEK.   Prescriptions: ULTRAM 50 MG TABS (TRAMADOL HCL) one tablet by mouth every 6 hours as needed for breakthrough pain  #30 x 0   Entered and Authorized by:   Johney Maine MD   Signed by:   Johney Maine MD on 10/03/2008   Method used:   Print then Give to Patient   RxID:   [id number] DOXYCYCLINE HYCLATE 100 MG CAPS (DOXYCYCLINE HYCLATE) 1 tab by mouth two times a day for 7 days  #14 x 0   Entered and Authorized by:   Johney Maine MD   Signed by:   Johney Maine MD on 10/03/2008   Method used:   Electronically to        [facility] (retail)       [address]       [place], [place]  [address]       Ph: [phone]        Fax: [phone]   RxID:   [id number]

## 2010-08-12 NOTE — Progress Notes (Signed)
Summary: Stress test FYI/TS  Phone Note Call from Patient Call back at [phone]   Summary of Call: Pt is requesting to speak with her MD about stress test results.  She states they are not good. Initial call taken by: Haydee Salter,  October 05, 2007 4:04 PM  Follow-up for Phone Call        called pt and advised to sched. appt with DR.Linthavong to discuss results. (done at [facility]). pt agreed. Follow-up by: Arlyss Repress CMA,,  October 08, 2007 9:00 AM

## 2010-08-12 NOTE — Progress Notes (Signed)
Summary: OXYGEN REFERRAL  Phone Note Other Incoming Call back at [phone]   Call placed by: [facility] @ [facility] Summary of Call: NEED RETURN CALL ON OXYGEN REFERRAL Initial call taken by: Levada Schilling,  July 17, 2007 3:34 PM  Follow-up for Phone Call        message left to return call. Follow-up by: Theresia Lo RN,  July 17, 2007 4:49 PM  Additional Follow-up for Phone Call Additional follow up Details #1::        Pt is returning call.  She would prefer floor model over tank.  Fax number is [phone]. Additional Follow-up by: Haydee Salter,  July 18, 2007 8:46 AM    Additional Follow-up for Phone Call Additional follow up Details #2::    calling again, sts they do not accept pts insurance [facility]), sts we will need to contact [facility] at [phone]. may be reached at  [phone] with any questions. Follow-up by: ERIN LEVAN,  July 18, 2007 11:46 AM  Additional Follow-up for Phone Call Additional follow up Details #3:: Details for Additional Follow-up Action Taken: call [facility] and they will call pt with O2 once demogrphx faxed to [phone]. demo faxed Additional Follow-up by: Lillia Pauls CMA,  July 18, 2007 12:11 PM       Appended Document: OXYGEN REFERRAL Carol/[facility] needs to speak with someone about info faxed, they need more information  Appended Document: OXYGEN REFERRAL spoke with Shanda Bumps and she states the rx did not have liter flow or duration and she needs this information before they can get pt set up with O2. will send message to MD  Appended Document: 02 HOME HEALTH NOTE faxed back to [facility] that pt needs 2 liters/min as long as pt stays above 92% SAT to  [phone]. ..................................................................Marland KitchenLillia Pauls CMA  July 19, 2007 5:07 PM

## 2010-08-12 NOTE — Assessment & Plan Note (Signed)
Summary: FU/KH   Vital Signs:  Patient Profile:   40 Years Old Female Height:     66.25 inches Weight:      222 pounds Pulse rate:   100 / minute BP sitting:   137 / 77  (right arm)  Pt. in pain?   yes    Location:   chest    Intensity:   8  Vitals Entered By: Arlyss Repress CMA, (October 30, 2007 42:58 PM)              Is Patient Diabetic? Yes      PCP:  [name]  MD  Chief Complaint:  multiple chronic medical problems and new UTI sx.  History of Present Illness: 40yo WF w/ multiple chronic medical issues and new UTI sx  Urinary complaints- New onset for past several weeks.  Reports increase frequency and burning with urination.  No hematuria.  No fevers or chills.  No low back pain.  No vaginal discharge.    Chest pain- Chronic chest pain continues with exertion.  Reports daily pain with minimal exertion.  Described as sharp pain localized to mid-sternum with associated SOB and wheezing.  No relief with sublingual nitro.  She has been seen by the cardiologist in March and had a nl nuclear study.  DM II- Has not been taking her insulin for over 1 month due to financial diffiuclties.  Last A1c checked on 09/02/07 was 11.1%.  Continues to have symptoms of hyperglycemia.  Positive for headaches, fatigue, and polyuria.  COPD-  Continues to have dyspnea with minimal exertion.  Has restarted smoking again after a brief period of cessation.  Uses albuterol often.  Has been using a friend's spiriva and reports that she still uses the advair daily.  Tobacco dependence- Restarted smoking.    Current Allergies: ! PENICILLIN ! * SINGULAIR    Risk Factors:  Tobacco use:  current   Review of Systems      See HPI   Physical Exam  General:     Obese white female, NAD Lungs:     No respiratory distress or appearance of increase work of breathing, no wheezing, no crackles, equal breath sounds throughout Heart:     Normal rate and regular rhythm. S1 and S2 normal  without gallop, murmur, click, rub or other extra sounds. Abdomen:     Obese, soft, NT, ND, no suprapubic tenderness or CVA tenderness Pulses:     2+ dorsalis pedal pulses Extremities:     no edema  Diabetes Management Exam:    Foot Exam (with socks and/or shoes not present):       Sensory-Pinprick/Light touch:          Left medial foot (L-4): diminished          Left dorsal foot (L-5): diminished          Left lateral foot (S-1): diminished          Right medial foot (L-4): diminished          Right dorsal foot (L-5): diminished          Right lateral foot (S-1): diminished       Sensory-Monofilament:          Left foot: absent          Right foot: absent       Inspection:          Left foot: normal          Right foot:  normal       Nails:          Left foot: normal          Right foot: normal    Eye Exam:       Eye Exam done elsewhere     Impression & Recommendations:  Problem # 1:  UTI (ICD-599.0) Assessment: New Despite an unconvincing UA, she has a good enough story for a UTI that I will treat her for an uncomplicated UTI.  Will obtain a urine culture to confirm diagnosis.   Her updated medication list for this problem includes:    Smz-tmp Ds 800-160 Mg Tabs (Sulfamethoxazole-trimethoprim) .Marland Kitchen... Take one tablet twice a day for 5 days  Orders: [facility]- Est  Level 4 (08657)   Problem # 2:  CHEST PAIN, NON-CARDIAC (ICD-786.59) Assessment: Unchanged Still do not think that her chest pain is cardiac related.  She has been r/o with inpatient cardiac monitors and also nuclear study.  Will continue to treat her risk factors.  Most likely a multifactorial- MSK, resp, and some anxiety component.  Will follow closely with frequent visits.   Orders: [facility]- Est  Level 4 (84696)   Problem # 3:  DM W/NEURO MNFST, TYPE II, UNCONTROLLED (ICD-250.62) Assessment: Deteriorated Noncompliance!!!  I have instructed her that it is essential that she takes her insulin daily.  I have  discontinued the expensive medications in order for her to be able to afford her Novolog.  Will check an A1c at the next visit in 6 weeks.  Her neurologic exam is worsening.   Her updated medication list for this problem includes:    Glipizide 10 Mg Tabs (Glipizide) .Marland Kitchen... Take one tablet by mouth two times a day    Novolog Mix 70/30 70-30 % Susp (Insulin aspart prot & aspart) .Marland KitchenMarland KitchenMarland KitchenMarland Kitchen 40 units subcutaneous 30 minutes before 5pm meal and 30 units subcutaneous 30 minutes before 7am meal    Metformin Hcl 500 Mg Tabs (Metformin hcl) .Marland Kitchen... Take two tablets with dinner once daily    Anacin 81 Mg Tbec (Aspirin) .Marland Kitchen... Take one tablet by mouth daily  Orders: [facility]- Est  Level 4 (29528)   Problem # 4:  COPD (ICD-496) Assessment: Deteriorated Symptoms are worsening b/c of noncompliance with medications and restarting smoking.  I have told her that her symptoms will continue to worsen as long as she continues to smoke.  I have discontinued the spiriva b/c of financial problems.   The following medications were removed from the medication list:    Spiriva Handihaler 18 Mcg Caps (Tiotropium bromide monohydrate) ..... One inhalation daily  Her updated medication list for this problem includes:    Albuterol 90 Mcg/act Aers (Albuterol) ..... Inhale 2 puff using inhaler every four hours    Albuterol Sulfate (2.5 Mg/5ml) 0.083% Nebu (Albuterol sulfate) .Marland Kitchen... Per manufacturer instructions as needed wheezing    Advair Diskus 500-50 Mcg/dose Misc (Fluticasone-salmeterol) .Marland Kitchen... 1 puff in the morning and 1 puff in the evening  Orders: [facility]- Est  Level 4 (41324)   Problem # 5:  HYPERTRIGLYCERIDEMIA (ICD-272.1) Assessment: Comment Only Switch her medication to cheaper simvastatin.  Will monitor for muscle complaints.   Her updated medication list for this problem includes:    Simvastatin 40 Mg Tabs (Simvastatin) ..... One tablet by mouth at bedtime  Orders: [facility]- Est  Level 4 (40102)   Problem # 6:  TOBACCO  DEPENDENCE (ICD-305.1) Assessment: Deteriorated No longer taking the Chantix and has restarted smoking.  Provided more  smoking cessation counseling.     The following medications were removed from the medication list:    Chantix Starting Month Pak 0.5 Mg X 11 & 1 Mg X 42 Misc (Varenicline tartrate) .Marland Kitchen... Per manufacturer instructions    Chantix Continuing Month Pak 1 Mg Tabs (Varenicline tartrate) .Marland Kitchen... Per manufacturer instructions  Orders: [facility]- Est  Level 4 (10272)   Complete Medication List: 1)  Albuterol 90 Mcg/act Aers (Albuterol) .... Inhale 2 puff using inhaler every four hours 2)  Glipizide 10 Mg Tabs (Glipizide) .... Take one tablet by mouth two times a day 3)  Albuterol Sulfate (2.5 Mg/57ml) 0.083% Nebu (Albuterol sulfate) .... Per manufacturer instructions as needed wheezing 4)  Novolog Mix 70/30 70-30 % Susp (Insulin aspart prot & aspart) .... 40 units subcutaneous 30 minutes before 5pm meal and 30 units subcutaneous 30 minutes before 7am meal 5)  Advair Diskus 500-50 Mcg/dose Misc (Fluticasone-salmeterol) .Marland Kitchen.. 1 puff in the morning and 1 puff in the evening 6)  Fluoxetine Hcl 10 Mg Caps (Fluoxetine hcl) .... One tablet at 5pm daily 7)  Metformin Hcl 500 Mg Tabs (Metformin hcl) .... Take two tablets with dinner once daily 8)  Simvastatin 40 Mg Tabs (Simvastatin) .... One tablet by mouth at bedtime 9)  Anacin 81 Mg Tbec (Aspirin) .... Take one tablet by mouth daily 10)  Meloxicam 15 Mg Tabs (Meloxicam) .Marland Kitchen.. 1 tablet by mouth daily with food 11)  Promethazine Hcl 12.5 Mg Tabs (Promethazine hcl) .Marland Kitchen.. 1-2 tablets by mouth every 6-8 hours as needed for nausea 12)  Smz-tmp Ds 800-160 Mg Tabs (Sulfamethoxazole-trimethoprim) .... Take one tablet twice a day for 5 days  Other Orders: Urinalysis-[facility] (00000) Urine Culture-[facility] ([id number])   Patient Instructions: 1)  Follow up in 6 weeks. 2)  All of your symptoms are related.  It is very important that you take your insulin everyday  and try your hardest to stop smoking as this will ultimately help your health.   3)  I have decreased the amount of medications to help you afford them.  All of them are important but I have kept the most important ones. 4)  I have also given you a medication to treat your Urinary tract infection.    Prescriptions: SMZ-TMP DS 800-160 MG  TABS (SULFAMETHOXAZOLE-TRIMETHOPRIM) take one tablet twice a day for 5 days  #10 x 0   Entered and Authorized by:   [name]  MD   Signed by:   [name]  MD on 10/30/2007   Method used:   Electronically sent to ...       [facility] [address]       [address]       [place], [place]  [address]       Ph: [phone]       Fax: [phone]   RxID:   [id number] SIMVASTATIN 40 MG  TABS (SIMVASTATIN) one tablet by mouth at bedtime  #31 x 3   Entered and Authorized by:   [name]  MD   Signed by:   [name]  MD on 10/30/2007   Method used:   Electronically sent to ...       [facility] [address]       [address]       [place], [place]  [address]       Ph:  [phone]       Fax: [phone]   RxID:   [id number]  ] Laboratory Results   Urine Tests  Date/Time Received: October 30, 2007 4:09  PM  Date/Time Reported: October 30, 2007 5:00 PM   Routine Urinalysis   Color: yellow Appearance: Clear Glucose: 500   (Normal Range: Negative) Bilirubin: negative   (Normal Range: Negative) Ketone: trace (5)   (Normal Range: Negative) Spec. Gravity: 1.015   (Normal Range: 1.003-1.035) Blood: trace-intact   (Normal Range: Negative) pH: 5.5   (Normal Range: 5.0-8.0) Protein: 30   (Normal Range: Negative) Urobilinogen: 0.2   (Normal Range: 0-1) Nitrite: negative   (Normal Range: Negative) Leukocyte Esterace: negative   (Normal Range: Negative)  Urine Microscopic WBC/HPF: 5-12 RBC/HPF: 0-3 Bacteria/HPF: 1+ Epithelial/HPF:  occ Yeast/HPF: several    Comments: dip ............test performed by...........Marland Kitchen Terese Door, CMA micro ...............test performed by......Marland KitchenBonnie A. [place], [place] (ASCP)

## 2010-08-12 NOTE — Progress Notes (Signed)
Summary: Lantus  Phone Note Call from Patient Call back at Home Phone [phone]   Summary of Call: insurance will not cover lantus, pharmacist told pt to call us. Initial call taken by: Haydee Salter,  February 15, 2008 4:39 PM  Follow-up for Phone Call        spoke with patient. insulin is actually Novolog 70/30. rx called to pharmacy. Follow-up by: Theresia Lo RN,  February 18, 2008 12:14 PM

## 2010-08-12 NOTE — Progress Notes (Signed)
Summary: Refill  Phone Note Call from Patient   Summary of Call: Pt needs refill on Glipizide, she states she called pharmacy last Friday - [facility] [address] in [place]. Initial call taken by: Haydee Salter,  April 16, 2007 8:59 AM  Follow-up for Phone Call        I will send the refill to her pharmacy. Follow-up by: [name]  MD,  April 18, 2007 12:53 PM      Prescriptions: GLIPIZIDE 5 MG  TABS (GLIPIZIDE) one tablet by mouth twice a day  #62 x 1   Entered and Authorized by:   [name]  MD   Signed by:   [name]  MD on 04/18/2007   Method used:   Electronically sent to ...       [facility] [address]       [address]       [place], [place]  [address]       Ph: [phone]       Fax: [phone]   RxID:   [id number]

## 2010-08-12 NOTE — Assessment & Plan Note (Signed)
Summary: F/U VISIT [facility]   Vital Signs:  Patient Profile:   40 Years Old Female Weight:      219.9 pounds Temp:     97.6 degrees F Pulse rate:   92 / minute BP sitting:   120 / 76  Pt. in pain?   yes    Location:   left leg    Intensity:   7  Vitals Entered By: Altamese Dilling CMA, (March 13, 2007 1:35 PM)                  PCP:  [name]  MD   History of Present Illness: [name] is a 40yo with insulin dependent DM that is here for a 1 wk f/u after increasing her Lantus by 1 unit each day until she reaches a desirable level of <120.  Unfortunately, her morning blood sugars range b/w 180-322 on 56 units of lantus.  After further questioning, it turns out that the morning readings are not true fasting blood sugars.  She works second shift so she gets home around 1am and takes her insulin around 10pm and eats dinner or a snack at 2am between her lantus dosage and her morning glucose reading.  She continues to smoke 1ppd and continues to c/o leg pain, dizziness, headaches, and polyuria.  She reports that her low fat healthy diet has continued and she has done well with her new exercise regimen of walking 1/2 hour a day.  Her recent lower extremity doppler study on Aug 29 was negative for DVTs.    Current Allergies: No known allergies   Past Medical History:    Reviewed history from 03/07/2007 and no changes required:       1.  Diabetes mellitus, type II       2.  h/o DVT       3.  Asthma       4.  Tobacco abuse       5.  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   Physical Exam  General:     Well-developed,well-nourished,in no acute distress;  alert,appropriate and cooperative throughout examination Head:     Normocephalic and atraumatic without obvious abnormalities. No apparent alopecia or balding. Lungs:     Normal respiratory effort, chest expands symmetrically. Lungs are clear to auscultation, no crackles or wheezes. Heart:     Normal rate and regular rhythm. S1 and S2 normal without gallop, murmur, click, rub or other extra sounds.    Impression & Recommendations:  Problem # 1:  DM W/MANIFESTATION NEC, TYPE II, UNCONTROLLED (ICD-250.82) Assessment: Unchanged Pt's symptoms continue.  All are most likely related to uncontrolled DM.  Due to the fact that her daily glucose continues to spike into the 300s, we have talked about being more aggressive with her treatment.  We will increase the Lantus by 4 units each day until we reach a fasting glucose goal of <120.  If this does not work, we will divide the lantus into two doses per day.  We also talked about other options such as insulin with meals or starting a more  hybrid agent such as Novolog 70/30.  We will reevaluate in one week.  Pt was encouraged and praised for her compliance to increasing the regimen, for her continue healthy diet, and her willingness to exercise.  She will be referred to the diabetic nutrition program for more education.   Her updated medication list for this problem includes:    Lantus 100 Unit/ml Soln (Insulin glargine) ..... Start at 44units at bedtime and increase by 2 units until morning goal of 90-120 is reached  Orders: [facility]- Est Level  3 (09323)  Orders: [facility]- Est Level  3 (55732)   Problem # 2:  TOBACCO DEPENDENCE (ICD-305.1) Discussed the detrimental effects of tobacco in association with her diabetes.  Suggested that she consider a medication such as Chantix for smoking cessation.  She will think about it and let me know next week. Orders: [facility]- Est Level  3 (20254)   Complete Medication List: 1)  Lantus 100 Unit/ml Soln (Insulin  glargine) .... Start at 44units at bedtime and increase by 2 units until morning goal of 90-120 is reached 2)  Albuterol 90 Mcg/act Aers (Albuterol) .... Inhale 2 puff using inhaler every four hours 3)  Advair Diskus 500-50 Mcg/dose Misc (Fluticasone-salmeterol) .Marland Kitchen.. 1 puff as directed twice a day   Patient Instructions: 1)  Take 60 units of Lantus tonight before going to work, then check fasting blood sugar after getting off of work.   2)  Increase by 4 units each day if fasting blood sugar is not less than 120. 3)  F/U in 1 week. 4)  Start diabetic nutrition program. 5)  Continue eating healthy and exercise regimen.      Vital Signs:  Patient Profile:   40 Years Old Female Weight:      219.9 pounds Temp:     97.6 degrees F Pulse rate:   92 / minute BP sitting:   120 / 76    Location:   left leg    Intensity:   7

## 2010-08-12 NOTE — Progress Notes (Signed)
Summary: Refills  Phone Note Call from Patient Call back at Home Phone [phone] Call back at [phone]   Summary of Call: Pt is needing a refill on insulin pen and liquid albuterol - [phone] Initial call taken by: Haydee Salter,  March 29, 2007 11:34 AM  Follow-up for Phone Call        pt is calling again, she sts she needs this before the weekend,  [facility] Follow-up by: ERIN LEVAN,  March 30, 2007 2:37 PM  Additional Follow-up for Phone Call Additional follow up Details #1::        pt is calling again, she is requesting we page the md Additional Follow-up by: ERIN LEVAN,  March 30, 2007 3:28 PM    Additional Follow-up for Phone Call Additional follow up Details #2::    Refills completed.  Pt did not actually need Albuterol inhaler - called pharmacy to cancel rx being filled.  Follow-up by: AMY MARTIN RN,  March 30, 2007 5:01 PM    Prescriptions: ALBUTEROL 90 MCG/ACT AERS (ALBUTEROL) Inhale 2 puff using inhaler every four hours  #1 x 0   Entered and Authorized by:   AMY MARTIN RN   Signed by:   Jacki Cones RN on 03/30/2007   Method used:   Print then Give to Patient   RxID:   [id number]

## 2010-08-12 NOTE — Progress Notes (Signed)
Summary: talk to doctor klein only   Phone Note Call from Patient Call back at [phone]   Caller: faye mother Reason for Call: Talk to Doctor Summary of Call: pt mother would like to speak with doctor only about her daughter Initial call taken by: Faythe Ghee,  October 21, 2008 12:59 PM  Follow-up for Phone Call        Spoke with Rosann Auerbach and requested cardiology consult per family Follow-up by: Gypsy Balsam RN BSN,  October 21, 2008 4:15 PM   New Allergies: AMOXICILLIN (AMOXICILLIN) AMOXICILLIN (AMOXICILLIN) New Allergies: AMOXICILLIN (AMOXICILLIN) AMOXICILLIN (AMOXICILLIN)

## 2010-08-12 NOTE — Assessment & Plan Note (Signed)
Summary: Diabetes - Lantus/ Novlog/ Meter - Rx Clinic   Vital Signs:  Patient profile:   40 year old female Weight:      231 pounds BMI:     37.14 Pulse rate:   98 / minute BP sitting:   121 / 78  (left arm)  Primary Care Lathan Gieselman:  Jamie Brookes MD   History of Present Illness: Pleasant 40 y.o. F here for diabetes follow-up. Pt did not bring her BG meter. She reports she has been using her mother's BG meter about 3 times daily due to an inability to pay for the strips/lancets for her own meter. She has recently gotten a new Medicare Part D insurance plan which she hopes will help pay for her supplies. She states that her BG has been running in the 500-600s and that the lowest BG she has had recently has been in the 300's. Reports BG 346 this AM, POC BG 400s in office. Reports compliance with current insulin regimen.  Pt reports using her albuterol HFA daily and albuterol nebulizer each night. She is prescribed Advair 500/50, but has not used this in a while (several months) due to the cost of the medication.   Of note, she has had several infections recently. Currently she is on cipro (past 2-3 weeks) until next week for infected teeth. She plans to have all her teeth pulled in the near future.    Current Medications (verified): 1)  Ventolin Hfa 108 (90 Base) Mcg/act Aers (Albuterol Sulfate) .... 2 Puffs As Needed Shortness of Breath. 2)  Novolog Mix 70/30 70-30 %  Susp (Insulin Aspart Prot & Aspart) .... 50 Units Subcutaneous 30 Minutes Before 5pm Meal and 55 Units Subcutaneous 30 Minutes Before 7am Meal 3)  Advair Diskus 500-50 Mcg/dose  Misc (Fluticasone-Salmeterol) .Marland Kitchen.. 1 Puff in The Morning and 1 Puff in The Evening 4)  Fluoxetine Hcl 40 Mg Caps (Fluoxetine Hcl) .Marland Kitchen.. 1 Tablet By Mouth Daily 5)  Crestor 20 Mg Tabs (Rosuvastatin Calcium) .... One Tablet By Mouth Daily For High Cholesterol 6)  Anacin 81 Mg  Tbec (Aspirin) .... Take One Tablet By Mouth Daily 7)  Compression Stockings  .... Wear Them For Lower Extremity Swelling As Needed 8)  Prilosec 20 Mg Cpdr (Omeprazole) .... Take One Tablet Each Morning 9)  Clonazepam 0.5 Mg Tabs (Clonazepam) .Marland Kitchen.. 1 Tab Two Times A Day Scheduled.  One Tab Mid-Day As Needed Per Dr. Donell Beers (Psych) 10)  Januvia 100 Mg Tabs (Sitagliptin Phosphate) .Marland Kitchen.. 1 Tablet By Mouth Daily For Diabetes 11)  Lamictal 25 Mg Tabs (Lamotrigine) .... Titration Up To 200mg  12)  Depakote Er 500 Mg Xr24h-Tab (Divalproex Sodium) .... 3 Tablets in The Am - 2 Tablets By Mouth At Bedtime 13)  Gabapentin 300 Mg Caps (Gabapentin) .Marland Kitchen.. 1 Tab By Mouth Daily Qam 14)  Percocet 5-325 Mg Tabs (Oxycodone-Acetaminophen) .... One  Tabs By Mouth Qam and Q4-6h As Needed For Pain. 15)  Colace 100 Mg Caps (Docusate Sodium) .... One Cap By Mouth Twice Daily 16)  Onetouch Ultra Test  Strp (Glucose Blood) .... Supply Qs For Three Times Daily Test For Patient Taking Basal and Bolus Insulin. 17)  Onetouch Delica Lancets  Misc (Lancets) .... Supply Qs For Three Times Daily Testing. 18)  Albuterol Sulfate (2.5 Mg/40ml) 0.083% Nebu (Albuterol Sulfate) .... Dispense Qs For Once Daily Use. 19)  Tramadol Hcl 50 Mg Tabs (Tramadol Hcl) .... Two Times A Day 20)  Ciprofloxacin Hcl 500 Mg Tabs (Ciprofloxacin Hcl) .... Two Times  A Day  Allergies: 1)  ! Penicillin 2)  ! Singulair (Montelukast Sodium) 3)  ! Morphine Sulfate (Morphine Sulfate)   Impression & Recommendations:  Problem # 1:  DM W/NEURO MNFST, TYPE II, UNCONTROLLED (ICD-250.62) Assessment Unchanged Diabetes of 20 yrs duration currently poorly controlled based on A1C of 12.8 (12/10)  fasting CBGs of 300-400  and random readings of 500-600. Denies hypoglycemic events.  Able to verbalize appropriate hypoglycemia management plan. Discontinued current insulin regimen with Novolog 70/30. Initiated  basal insulin with Lantus 45 units twice daily.  Pt will continue to titrate 1 unit if fasting CBGs > 100 until fasting CBGs reach goal.  Initiated meal coverage with Novolog 15 units prior to meals. Pt will increase to 20 units prior to meals if after 3 days CBGs remain above goal. Provided pt with One Touch meter based on ins. plan. Prescriptions for strips and lancets [facility]. Written pt instructions provided.  F/U w/ Dr. Rodena Medin in 1-2 weeks.  TTFFC: 60  mins.  Pt seen with: Reina Fuse, PharmD.  The following medications were removed from the medication list:    Novolog Mix 70/30 70-30 % Susp (Insulin aspart prot & aspart) .Marland KitchenMarland KitchenMarland KitchenMarland Kitchen 50 units subcutaneous 30 minutes before 5pm meal and 55 units subcutaneous 30 minutes before 7am meal Her updated medication list for this problem includes:    Anacin 81 Mg Tbec (Aspirin) .Marland Kitchen... Take one tablet by mouth daily    Januvia 100 Mg Tabs (Sitagliptin phosphate) .Marland Kitchen... 1 tablet by mouth daily for diabetes    Lantus 100 Unit/ml Soln (Insulin glargine) ..... Inject 45 units twice daily and increase as instructed.    Novolog 100 Unit/ml Soln (Insulin aspart) .Marland Kitchen... Take 15 units prior to each meal.  increase to 20 units prior to each meal.  Problem # 2:  ASTHMA, UNSPECIFIED (ICD-493.90) Assessment: Unchanged  Uncontrolled based on patient report of increased albuterol use and not using Advair due to the cost of the medication. [facility] in [facility]ills [facility]. New insurance plan should help cover the cost of Advair. Reviewed administration of Advair and instructed patient to take every day even if she did not have symtoms and that albuterol should be used as a "rescue" medication.  Her updated medication list for this problem includes:    Ventolin Hfa 108 (90 Base) Mcg/act Aers (Albuterol sulfate) .Marland Kitchen... 2 puffs as needed shortness of breath.    Advair Diskus 500-50 Mcg/dose Misc (Fluticasone-salmeterol) .Marland Kitchen... 1 puff in the morning and 1 puff in the evening    Albuterol Sulfate (2.5 Mg/21ml) 0.083% Nebu (Albuterol sulfate) .Marland Kitchen... Dispense qs for once daily  use.  Orders: Reassessment Each 15 min unit- [facility] (29562)  Problem # 3:  TOBACCO DEPENDENCE (ICD-305.1)  Contemplating quit attempt, but not ready at this time. Identifies stress as primary trigger for continued smoking. Pt has had increased stress due to mother recently being placed on hospice care. Continue to assess at each visit.   Orders: Reassessment Each 15 min unit- [facility] (13086)  Complete Medication List: 1)  Ventolin Hfa 108 (90 Base) Mcg/act Aers (Albuterol sulfate) .... 2 puffs as needed shortness of breath. 2)  Advair Diskus 500-50 Mcg/dose Misc (Fluticasone-salmeterol) .Marland Kitchen.. 1 puff in the morning and 1 puff in the evening 3)  Fluoxetine Hcl 40 Mg Caps (Fluoxetine hcl) .Marland Kitchen.. 1 tablet by mouth daily 4)  Crestor 20 Mg Tabs (Rosuvastatin calcium) .... One tablet by mouth daily for high cholesterol 5)  Anacin  81 Mg Tbec (Aspirin) .... Take one tablet by mouth daily 6)  Compression Stockings  .... Wear them for lower extremity swelling as needed 7)  Prilosec 20 Mg Cpdr (Omeprazole) .... Take one tablet each morning 8)  Clonazepam 0.5 Mg Tabs (Clonazepam) .Marland Kitchen.. 1 tab two times a day scheduled.  one tab mid-day as needed per dr. Donell Beers (psych) 9)  Januvia 100 Mg Tabs (Sitagliptin phosphate) .Marland Kitchen.. 1 tablet by mouth daily for diabetes 10)  Lamictal 25 Mg Tabs (Lamotrigine) .... Titration up to 200mg  11)  Depakote Er 500 Mg Xr24h-tab (Divalproex sodium) .... 3 tablets in the am - 2 tablets by mouth at bedtime 12)  Gabapentin 300 Mg Caps (Gabapentin) .Marland Kitchen.. 1 tab by mouth daily qam 13)  Percocet 5-325 Mg Tabs (Oxycodone-acetaminophen) .... One  tabs by mouth qam and q4-6h as needed for pain. 14)  Colace 100 Mg Caps (Docusate sodium) .... One cap by mouth twice daily 15)  Onetouch Ultra Test Strp (Glucose blood) .... Supply qs for three times daily test for patient taking basal and bolus insulin. 16)  Onetouch Delica Lancets Misc (Lancets) .... Supply qs for three times daily testing. 17)   Albuterol Sulfate (2.5 Mg/95ml) 0.083% Nebu (Albuterol sulfate) .... Dispense qs for once daily use. 18)  Tramadol Hcl 50 Mg Tabs (Tramadol hcl) .... Two times a day 19)  Ciprofloxacin Hcl 500 Mg Tabs (Ciprofloxacin hcl) .... Two times a day 20)  Lantus 100 Unit/ml Soln (Insulin glargine) .... Inject 45 units twice daily and increase as instructed. 21)  Novolog 100 Unit/ml Soln (Insulin aspart) .... Take 15 units prior to each meal.  increase to 20 units prior to each meal.  Patient Instructions: 1)  Lantus - Take 45 units twice daily today.  Then increase by one unit daily (WITH each dose) if you mornig blood sugar is greater than 150.   2)  Novolog take 15 units prior to meals for the next three days then if your blood sugar is still > 200 increase to 20 units prior meals.  3)  Advair Take twice - inhale quickly just like drinking quickly through a straw.  Prescriptions: NOVOLOG 100 UNIT/ML SOLN (INSULIN ASPART) Take 15 units prior to each meal.  Increase to 20 units prior to each meal.  #1 x 0   Entered and Authorized by:   Christian Mate D   Signed by:   Madelon Lips Pharm D on 03/12/2010   Method used:   Historical   RxID:   [id number] LANTUS 100 UNIT/ML SOLN (INSULIN GLARGINE) Inject 45 units twice daily and increase as instructed.  #1 x 0   Entered and Authorized by:   Christian Mate D   Signed by:   Madelon Lips Pharm D on 03/12/2010   Method used:   Historical   RxID:   [id number] CIPROFLOXACIN HCL 500 MG TABS (CIPROFLOXACIN HCL) two times a day  #1 x 0   Entered and Authorized by:   Christian Mate D   Signed by:   Madelon Lips Pharm D on 03/12/2010   Method used:   Historical   RxID:   [id number] TRAMADOL HCL 50 MG TABS (TRAMADOL HCL) two times a day  #1 x 0   Entered and Authorized by:   Christian Mate D   Signed by:   Madelon Lips Pharm D on 03/12/2010   Method used:   Historical   RxID:   [id number] COLACE 100 MG CAPS (DOCUSATE SODIUM) One  cap by mouth twice daily  #1 x 0   Entered and Authorized by:   Christian Mate D   Signed by:   Madelon Lips Pharm D on 03/12/2010   Method used:   Historical   RxID:   [id number] PERCOCET 5-325 MG TABS (OXYCODONE-ACETAMINOPHEN) One  tabs by mouth QAM and q4-6h as needed for pain.  #1 x 0   Entered and Authorized by:   Christian Mate D   Signed by:   Madelon Lips Pharm D on 03/12/2010   Method used:   Historical   RxID:   [id number] GABAPENTIN 300 MG CAPS (GABAPENTIN) 1 tab by mouth daily QAM  #1 x 0   Entered and Authorized by:   Christian Mate D   Signed by:   Madelon Lips Pharm D on 03/12/2010   Method used:   Historical   RxID:   [id number] DEPAKOTE ER 500 MG XR24H-TAB (DIVALPROEX SODIUM) 3 tablets in the AM - 2 tablets by mouth at bedtime  #1 x 0   Entered and Authorized by:   Christian Mate D   Signed by:   Madelon Lips Pharm D on 03/12/2010   Method used:   Historical   RxID:   [id number] JANUVIA 100 MG TABS (SITAGLIPTIN PHOSPHATE) 1 tablet by mouth daily for diabetes  #30 x 0   Entered by:   Christian Mate D   Authorized by:   Jamie Brookes MD   Signed by:   Madelon Lips Pharm D on 03/12/2010   Method used:   Historical   RxID:   [id number] CRESTOR 20 MG TABS (ROSUVASTATIN CALCIUM) one tablet by mouth daily for high cholesterol  #30 x 0   Entered by:   Christian Mate D   Authorized by:   Jamie Brookes MD   Signed by:   Madelon Lips Pharm D on 03/12/2010   Method used:   Historical   RxID:   [id number] ALBUTEROL SULFATE (2.5 MG/3ML) 0.083% NEBU (ALBUTEROL SULFATE) Dispense QS for once daily use.  #30 x 1   Entered by:   Christian Mate D   Authorized by:   Jamie Brookes MD   Signed by:   Madelon Lips Pharm D on 03/12/2010   Method used:   [facility]        [facility]  [place] (retail)       [address]       [place], [place]  [address]       Ph: [phone] or [phone]       Fax: [phone]    RxID:   [id number] Dola Argyle LANCETS  MISC (LANCETS) Supply QS for three times daily testing.  #1 x 11   Entered by:   Christian Mate D   Authorized by:   Jamie Brookes MD   Signed by:   Madelon Lips Pharm D on 03/12/2010   Method used:   [facility]        [facility]  [place] (retail)       [address]       [place], [place]  [address]       Ph: [phone] or [phone]       Fax: [phone]   RxID:   [id number] ONETOUCH ULTRA TEST  STRP (GLUCOSE BLOOD) Supply QS for three times daily test for patient taking basal and bolus insulin.  #1 x 11   Entered by:   Christian Mate D  Authorized by:   Jamie Brookes MD   Signed by:   Madelon Lips Pharm D on 03/12/2010   Method used:   [facility]        [facility]  [place] (retail)       [address]       [place], [place]  [address]       Ph: [phone] or [phone]       Fax: [phone]   RxID:   [id number] ADVAIR DISKUS 500-50 MCG/DOSE  MISC (FLUTICASONE-SALMETEROL) 1 puff in the morning and 1 puff in the evening  #1 x 11   Entered by:   Christian Mate D   Authorized by:   Jamie Brookes MD   Signed by:   Madelon Lips Pharm D on 03/12/2010   Method used:   [facility]        [facility]  [place] (retail)       [address]       [place], [place]  [address]       Ph: [phone] or [phone]       Fax: [phone]   RxID:   [id number] VENTOLIN HFA 108 (90 BASE) MCG/ACT AERS (ALBUTEROL SULFATE) 2 puffs as needed shortness of breath.  #1 x 3   Entered by:   Christian Mate D   Authorized by:   Jamie Brookes MD   Signed by:   Madelon Lips Pharm D on 03/12/2010   Method used:   [facility]        [facility]  [id number] ([facility]ail[facility]       [address]       [place], [place]  [address]       Ph: [phone] or [phone]       Fax: [phone]   RxID:   [id number]   Prevention & Chronic Care Immunizations    Influenza vaccine: Fluvax Non-MCR  (04/30/2009)   Influenza vaccine due: 03/28/2009    Tetanus booster: Not documented    Pneumococcal vaccine: Not documented  Other Screening   Pap smear: Not documented    Mammogram: Not documented   Smoking status: current  (06/10/2009)   Smoking cessation counseling: yes  (08/07/2008)  Diabetes Mellitus   HgbA1C: 12.8  (06/10/2009)   Hemoglobin A1C due: 11/30/2007    Eye exam: Not documented    Foot exam: yes  (05/09/2008)   High risk foot: Not documented   Foot care education: Not documented   Foot exam due: 10/29/2008    Urine microalbumin/creatinine ratio: Not documented    Diabetes flowsheet reviewed?: Yes   Progress toward A1C goal: Unchanged  Lipids   Total Cholesterol: 217  (09/02/2007)   LDL: See Comment mg/dL  (99/37/1696)   LDL Direct: 145  (04/30/2009)   HDL: 32  (09/02/2007)   Triglycerides: 462  (09/02/2007)    SGOT (AST): 8  (03/02/2010)   SGPT (ALT): 8  (03/02/2010)   Alkaline phosphatase: 104  (03/02/2010)   Total bilirubin: 0.3  (03/02/2010)    Lipid flowsheet reviewed?: Yes   Progress toward LDL goal: Unchanged  Self-Management Support :   Personal Goals (by the next clinic visit) :     Personal A1C goal: 8  (04/30/2009)     Personal blood pressure goal: 130/80  (03/12/2010)     Personal LDL goal: 100  (04/30/2009)    Diabetes self-management support: CBG self-monitoring log, Written self-care plan, Education handout  (06/10/2009)    Lipid self-management support: Written  self-care plan  (06/10/2009)

## 2010-08-12 NOTE — Miscellaneous (Signed)
Summary: Neuro referral  Clinical Lists Changes  lease refer patient to Neurology for continued syncope.  Fax last office visit to provider.  ORder is in chart. TRICHE MD, JESSICA August 20, 2008   [facility] referral with labs and office notes sent via fax to [phone] ..................Marland KitchenDelores Pate-Gaddy, CMA (AAMA) August 21, 2008 9:55 AM

## 2010-08-12 NOTE — Progress Notes (Signed)
Summary: Triage  Phone Note Call from Patient Call back at Home Phone [phone]   Summary of Call: Pt was seen a couple of days ago and was told if she wasn't feeling any better today to call and speak with a nurse. Initial call taken by: Haydee Salter,  Nov 21, 2007 8:34 AM  Follow-up for Phone Call        states the shot helped for a short while. now as bad as ever. appt made to f/u with md who saw her last for this Follow-up by: Golden Circle RN,  Nov 21, 2007 8:40 AM
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
## 2010-08-16 ENCOUNTER — Encounter: Payer: Self-pay | Admitting: Family Medicine

## 2010-08-16 ENCOUNTER — Other Ambulatory Visit: Payer: Self-pay | Admitting: Family Medicine

## 2010-08-16 ENCOUNTER — Ambulatory Visit (INDEPENDENT_AMBULATORY_CARE_PROVIDER_SITE_OTHER): Payer: Medicare Other | Admitting: Family Medicine

## 2010-08-16 DIAGNOSIS — E1149 Type 2 diabetes mellitus with other diabetic neurological complication: Secondary | ICD-10-CM

## 2010-08-16 DIAGNOSIS — K089 Disorder of teeth and supporting structures, unspecified: Secondary | ICD-10-CM

## 2010-08-16 LAB — CBC WITH DIFFERENTIAL/PLATELET
Basophils Absolute: 0.1 10*3/uL (ref 0.0–0.1)
Basophils Relative: 1 % (ref 0–1)
Eosinophils Absolute: 0.5 10*3/uL (ref 0.0–0.7)
Eosinophils Relative: 4 % (ref 0–5)
HCT: 42.9 % (ref 36.0–46.0)
Hemoglobin: 14.5 g/dL (ref 12.0–15.0)
Lymphocytes Relative: 26 % (ref 12–46)
Lymphs Abs: 3.1 10*3/uL (ref 0.7–4.0)
MCH: 29.4 pg (ref 26.0–34.0)
MCHC: 33.8 g/dL (ref 30.0–36.0)
MCV: 87 fL (ref 78.0–100.0)
Monocytes Absolute: 0.5 10*3/uL (ref 0.1–1.0)
Monocytes Relative: 4 % (ref 3–12)
Neutro Abs: 7.8 10*3/uL — ABNORMAL HIGH (ref 1.7–7.7)
Neutrophils Relative %: 65 % (ref 43–77)
Platelets: 287 10*3/uL (ref 150–400)
RBC: 4.93 MIL/uL (ref 3.87–5.11)
RDW: 14.2 % (ref 11.5–15.5)
WBC: 12 10*3/uL — ABNORMAL HIGH (ref 4.0–10.5)

## 2010-08-17 LAB — CONVERTED CEMR LAB
Basophils Absolute: 0.1 10*3/uL (ref 0.0–0.1)
Basophils Relative: 1 % (ref 0–1)
Eosinophils Absolute: 0.5 10*3/uL (ref 0.0–0.7)
Eosinophils Relative: 4 % (ref 0–5)
HCT: 42.9 % (ref 36.0–46.0)
Hemoglobin: 14.5 g/dL (ref 12.0–15.0)
Lymphocytes Relative: 26 % (ref 12–46)
Lymphs Abs: 3.1 10*3/uL (ref 0.7–4.0)
MCHC: 33.8 g/dL (ref 30.0–36.0)
MCV: 87 fL (ref 78.0–100.0)
Monocytes Absolute: 0.5 10*3/uL (ref 0.1–1.0)
Monocytes Relative: 4 % (ref 3–12)
Neutro Abs: 7.8 10*3/uL — ABNORMAL HIGH (ref 1.7–7.7)
Neutrophils Relative %: 65 % (ref 43–77)
Platelets: 287 10*3/uL (ref 150–400)
RBC: 4.93 M/uL (ref 3.87–5.11)
RDW: 14.2 % (ref 11.5–15.5)
WBC: 12 10*3/uL — ABNORMAL HIGH (ref 4.0–10.5)

## 2010-08-22 LAB — CULTURE, BLOOD (SINGLE)
Organism ID, Bacteria: NO GROWTH
Organism ID, Bacteria: NO GROWTH

## 2010-08-23 ENCOUNTER — Telehealth: Payer: Self-pay | Admitting: Family Medicine

## 2010-08-23 NOTE — Telephone Encounter (Signed)
Please let her know that the blood cultures were negative. She does not have bacteria in her blood from the decaying teeth. Thanks.

## 2010-08-26 NOTE — Assessment & Plan Note (Signed)
Summary: dental disease, DM2   Vital Signs:  Patient profile:   40 year old female Height:      66.25 inches Weight:      211 pounds Temp:     98.1 degrees F oral Pulse rate:   109 / minute Pulse rhythm:   regular BP sitting:   129 / 84  (left arm) Cuff size:   regular  Vitals Entered By: Loralee Pacas CMA (August 16, 2010 2:28 PM) CC: dental disease, DM2 Is Patient Diabetic? Yes   Primary Care Provider:  Jamie Brookes MD  CC:  dental disease and DM2.  History of Present Illness: Dental Disease: Pt has horrible dental disease that has been worsening for years. She was recently seen by [facility] by Dr. Rennis Harding.  She says that she was told she had "gangrene" of her teeth. She originally made this appointment to have paperwork filled out for her Pre-Op. However, she  says it is going to cost her over 4,000 dollars to have her teeth pulled and she can't afford to have it done. She wants to know if there is anyone else in the area who can do her teeth.   DM2: Pt has had blood sugars in the 300's because she has infected teeth. She is not having fevers but is tachycardic today. She has a small 1 cm superficial foot wound on the Rt foot but says it is improving. It is just healing slowly b/c she is a diabetic.   Habits & Providers  Alcohol-Tobacco-Diet     Tobacco Status: current  Comments: smoking 1/2 ppd  Current Medications (verified): 1)  Ventolin Hfa 108 (90 Base) Mcg/act Aers (Albuterol Sulfate) .... 2 Puffs As Needed Shortness of Breath. 2)  Advair Diskus 500-50 Mcg/dose  Misc (Fluticasone-Salmeterol) .Marland Kitchen.. 1 Puff in The Morning and 1 Puff in The Evening 3)  Fluoxetine Hcl 40 Mg Caps (Fluoxetine Hcl) .Marland Kitchen.. 1 Tablet By Mouth Daily 4)  Crestor 20 Mg Tabs (Rosuvastatin Calcium) .... One Tablet By Mouth Daily For High Cholesterol 5)  Anacin 81 Mg  Tbec (Aspirin) .... Take One Tablet By Mouth Daily 6)  Compression Stockings .... Wear Them For Lower Extremity  Swelling As Needed 7)  Prilosec 20 Mg Cpdr (Omeprazole) .... Take One Tablet Each Morning 8)  Clonazepam 0.5 Mg Tabs (Clonazepam) .Marland Kitchen.. 1 Tab Two Times A Day Scheduled.  One Tab Mid-Day As Needed Per Dr. Tiajuana Amass. 9)  Lamictal Odt 100 Mg Tbdp (Lamotrigine) .... Take 3 Pills Once Daily (300 Mg) 10)  Gabapentin 300 Mg Caps (Gabapentin) .Marland Kitchen.. 1 Tab By Mouth Daily Qam For 4 Days, Then Twice A Day For 1 Week, Then 2 Pills in The Morning and 1 At Night For 2 Weeks. 11)  Onetouch Ultra Test  Strp (Glucose Blood) .... Supply Qs For Three Times Daily Test For Patient Taking Basal and Bolus Insulin. 12)  Onetouch Delica Lancets  Misc (Lancets) .... Supply Qs For Three Times Daily Testing. 13)  Albuterol Sulfate (2.5 Mg/34ml) 0.083% Nebu (Albuterol Sulfate) .... Dispense Qs For Once Daily Use. 14)  Tramadol Hcl 50 Mg Tabs (Tramadol Hcl) .... Two Times A Day For Pain 15)  Lantus 100 Unit/ml Soln (Insulin Glargine) .... Inject 52 Units Twice Daily and Increase in 5 Days in Am Cbg's Are Still > 140. Give 31 Day Supply 16)  Novolog 100 Unit/ml Soln (Insulin Aspart) .... Take 15 Units Prior To Each Meal.  Increase To 20 Units Prior To Each Meal.  Give Qs For 1 Month  Allergies (verified): 1)  ! Penicillin 2)  ! Singulair (Montelukast Sodium) 3)  ! Morphine Sulfate (Morphine Sulfate)  Review of Systems       no fevers, chills, poor wound healing, no sensation in her feet, + headaches, + dental pain   Physical Exam  General:  Well-developed,well-nourished,in no acute distress; alert,appropriate and cooperative throughout examination Head:  Normocephalic and atraumatic without obvious abnormalities. No apparent alopecia or balding. Mouth:  dental decay and cracking on upper leftsided teeth, gums are receding in all teeth, foul smelling breath.   Diabetes Management Exam:    Foot Exam (with socks and/or shoes not present):       Sensory-Monofilament:          Left foot: absent          Right foot:  absent       Inspection:          Left foot: normal          Right foot: abnormal             Comments: small 1 cm healing lesion on top of foot       Nails:          Left foot: thickened          Right foot: thickened   Impression & Recommendations:  Problem # 1:  UNSPECIFIED DISORDER TEETH&SUPPORTING STRUCTURES (ICD-525.9) Assessment Deteriorated  Pt has severe gum disease. Plan to get CBC and Blood cultures. tachycardic today but afebrile. Looking for infection. If positive will consider the proper treatment with antibiotics.   Orders: CBC w/Diff-[facility] (78469) Culture, Blood- [facility] ([id number]) [facility]- Est Level  3 (44010)  Problem # 2:  DM W/NEURO MNFST, TYPE II, UNCONTROLLED (ICD-250.62) Assessment: Deteriorated  Pt has infection in her teeth so her CBG's are chronically elevated. They will likely not be controlled until she has her teeth pulled and her mouth heals. she has neuropathy up to the level of her mid leg.   Her updated medication list for this problem includes:    Anacin 81 Mg Tbec (Aspirin) .Marland Kitchen... Take one tablet by mouth daily    Lantus 100 Unit/ml Soln (Insulin glargine) ..... Inject 52 units twice daily and increase in 5 days in am cbg's are still > 140. give 31 day supply    Novolog 100 Unit/ml Soln (Insulin aspart) .Marland Kitchen... Take 15 units prior to each meal.  increase to 20 units prior to each meal. give qs for 1 month    Her updated medication list for this problem includes:    Anacin 81 Mg Tbec (Aspirin) .Marland Kitchen... Take one tablet by mouth daily    Lantus 100 Unit/ml Soln (Insulin glargine) ..... Inject 52 units twice daily and increase in 5 days in am cbg's are still > 140. give 31 day supply    Novolog 100 Unit/ml Soln (Insulin aspart) .Marland Kitchen... Take 15 units prior to each meal.  increase to 20 units prior to each meal. give qs for 1 month  Orders: [facility]- Est Level  3 (99213)  Complete Medication List: 1)  Ventolin Hfa 108 (90 Base) Mcg/act Aers (Albuterol sulfate) ....  2 puffs as needed shortness of breath. 2)  Advair Diskus 500-50 Mcg/dose Misc (Fluticasone-salmeterol) .Marland Kitchen.. 1 puff in the morning and 1 puff in the evening 3)  Fluoxetine Hcl 40 Mg Caps (Fluoxetine hcl) .Marland Kitchen.. 1 tablet by mouth daily 4)  Crestor 20 Mg Tabs (Rosuvastatin calcium) .... One tablet  by mouth daily for high cholesterol 5)  Anacin 81 Mg Tbec (Aspirin) .... Take one tablet by mouth daily 6)  Compression Stockings  .... Wear them for lower extremity swelling as needed 7)  Prilosec 20 Mg Cpdr (Omeprazole) .... Take one tablet each morning 8)  Clonazepam 0.5 Mg Tabs (Clonazepam) .Marland Kitchen.. 1 tab two times a day scheduled.  one tab mid-day as needed per dr. Lorin Picket cunningham. 9)  Lamictal Odt 100 Mg Tbdp (Lamotrigine) .... Take 3 pills once daily (300 mg) 10)  Gabapentin 300 Mg Caps (Gabapentin) .Marland Kitchen.. 1 tab by mouth daily qam for 4 days, then twice a day for 1 week, then 2 pills in the morning and 1 at night for 2 weeks. 11)  Onetouch Ultra Test Strp (Glucose blood) .... Supply qs for three times daily test for patient taking basal and bolus insulin. 12)  Onetouch Delica Lancets Misc (Lancets) .... Supply qs for three times daily testing. 13)  Albuterol Sulfate (2.5 Mg/88ml) 0.083% Nebu (Albuterol sulfate) .... Dispense qs for once daily use. 14)  Tramadol Hcl 50 Mg Tabs (Tramadol hcl) .... Two times a day for pain 15)  Lantus 100 Unit/ml Soln (Insulin glargine) .... Inject 52 units twice daily and increase in 5 days in am cbg's are still > 140. give 31 day supply 16)  Novolog 100 Unit/ml Soln (Insulin aspart) .... Take 15 units prior to each meal.  increase to 20 units prior to each meal. give qs for 1 month  Patient Instructions: 1)  Call around to all the dental offices and find out who accepts Medicare and will work with you on a payment plan or at least do an evaluation.    Orders Added: 1)  CBC w/Diff-[facility] [85025] 2)  Culture, Blood- [facility] [[id number]] 3)  [facility]- Est Level  3 [40102]

## 2010-08-30 NOTE — Telephone Encounter (Signed)
Spoke with patient and informed her that blood cultures were negative and no bacteria. She was wanting to know what she can do now to get her teeth pulled out. I informed patient that she can find a dentist that takes her insurance Counselling psychologist) and make an appointment with them.Logan Bores, Roselyn Meier

## 2010-09-03 IMAGING — CR DG CHEST 2V
2 series · 2 of 2 positions shown · non-contrast
Comparison: 07/25/2008

CLINICAL DATA: Chest pain.

CHEST - 2 VIEW

[w chest pa]
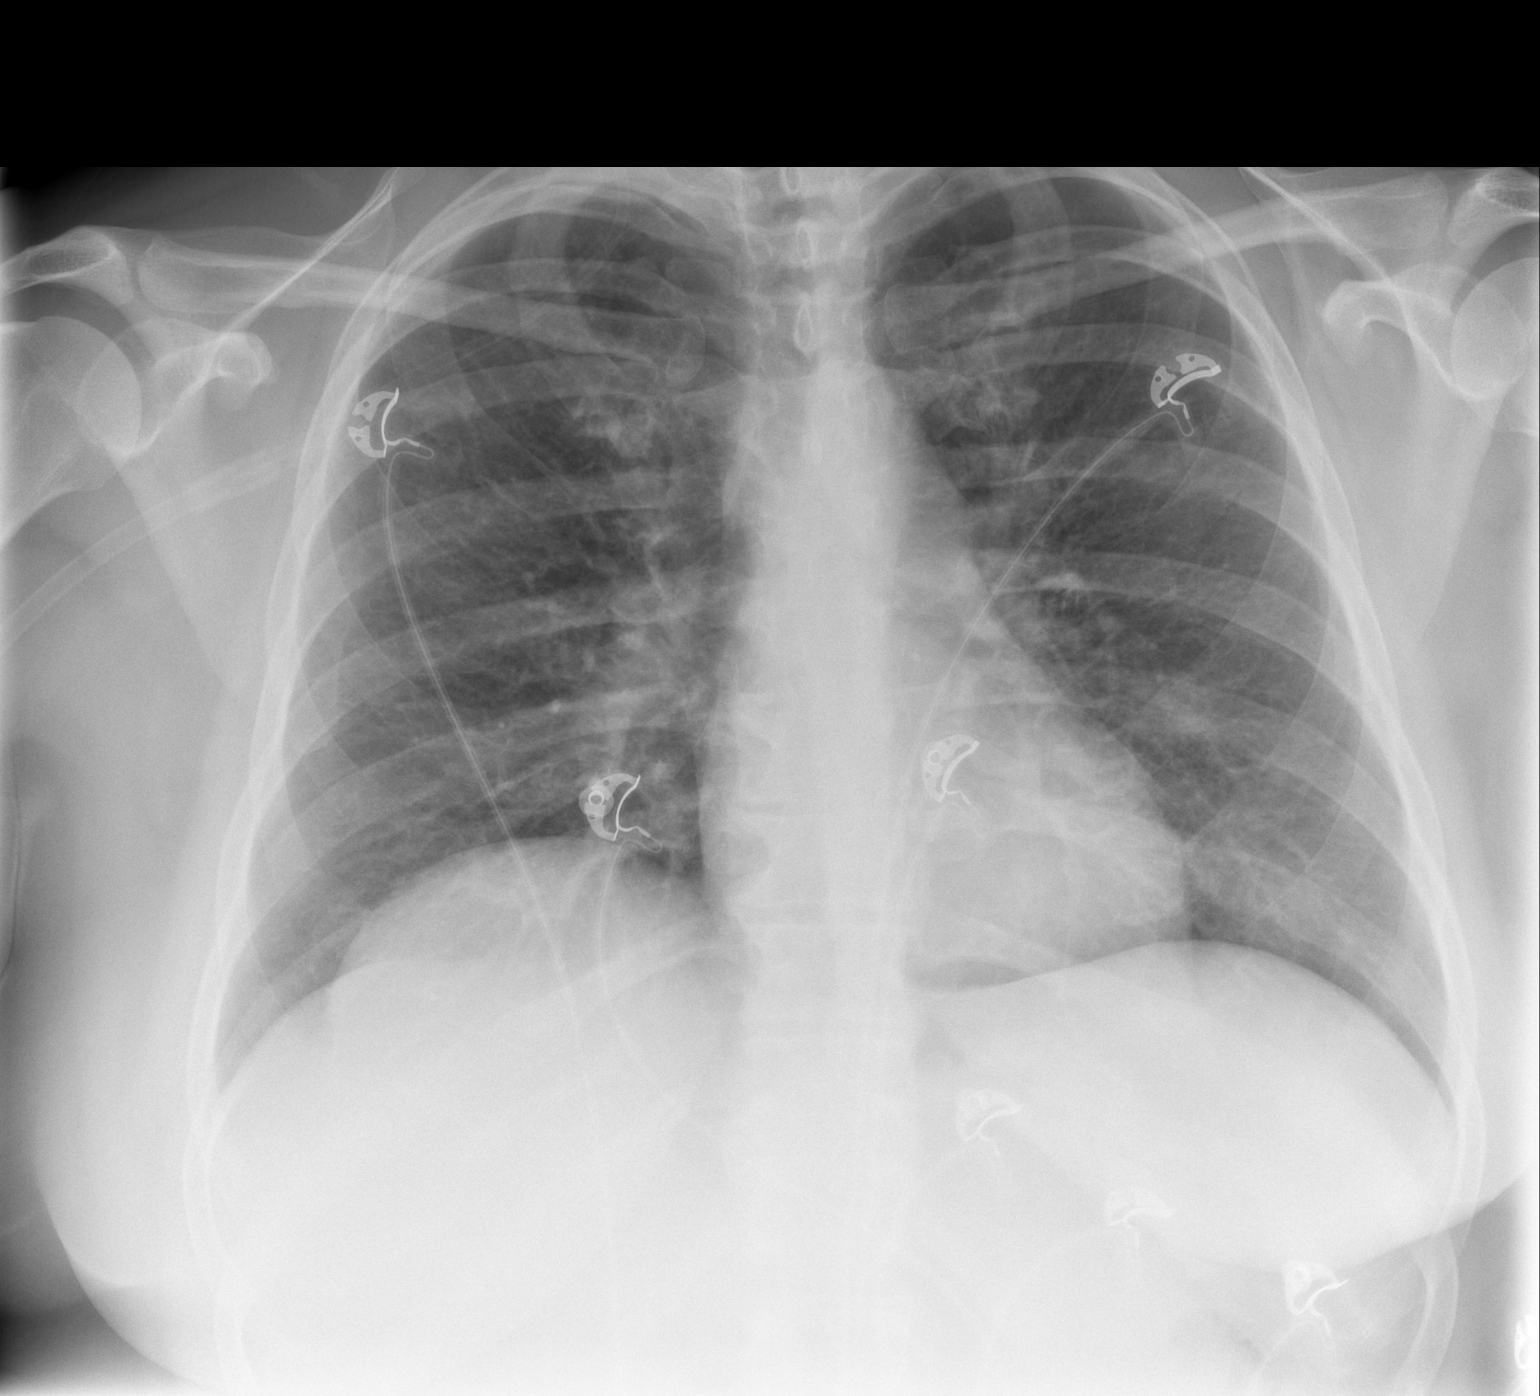

[w chest lat]
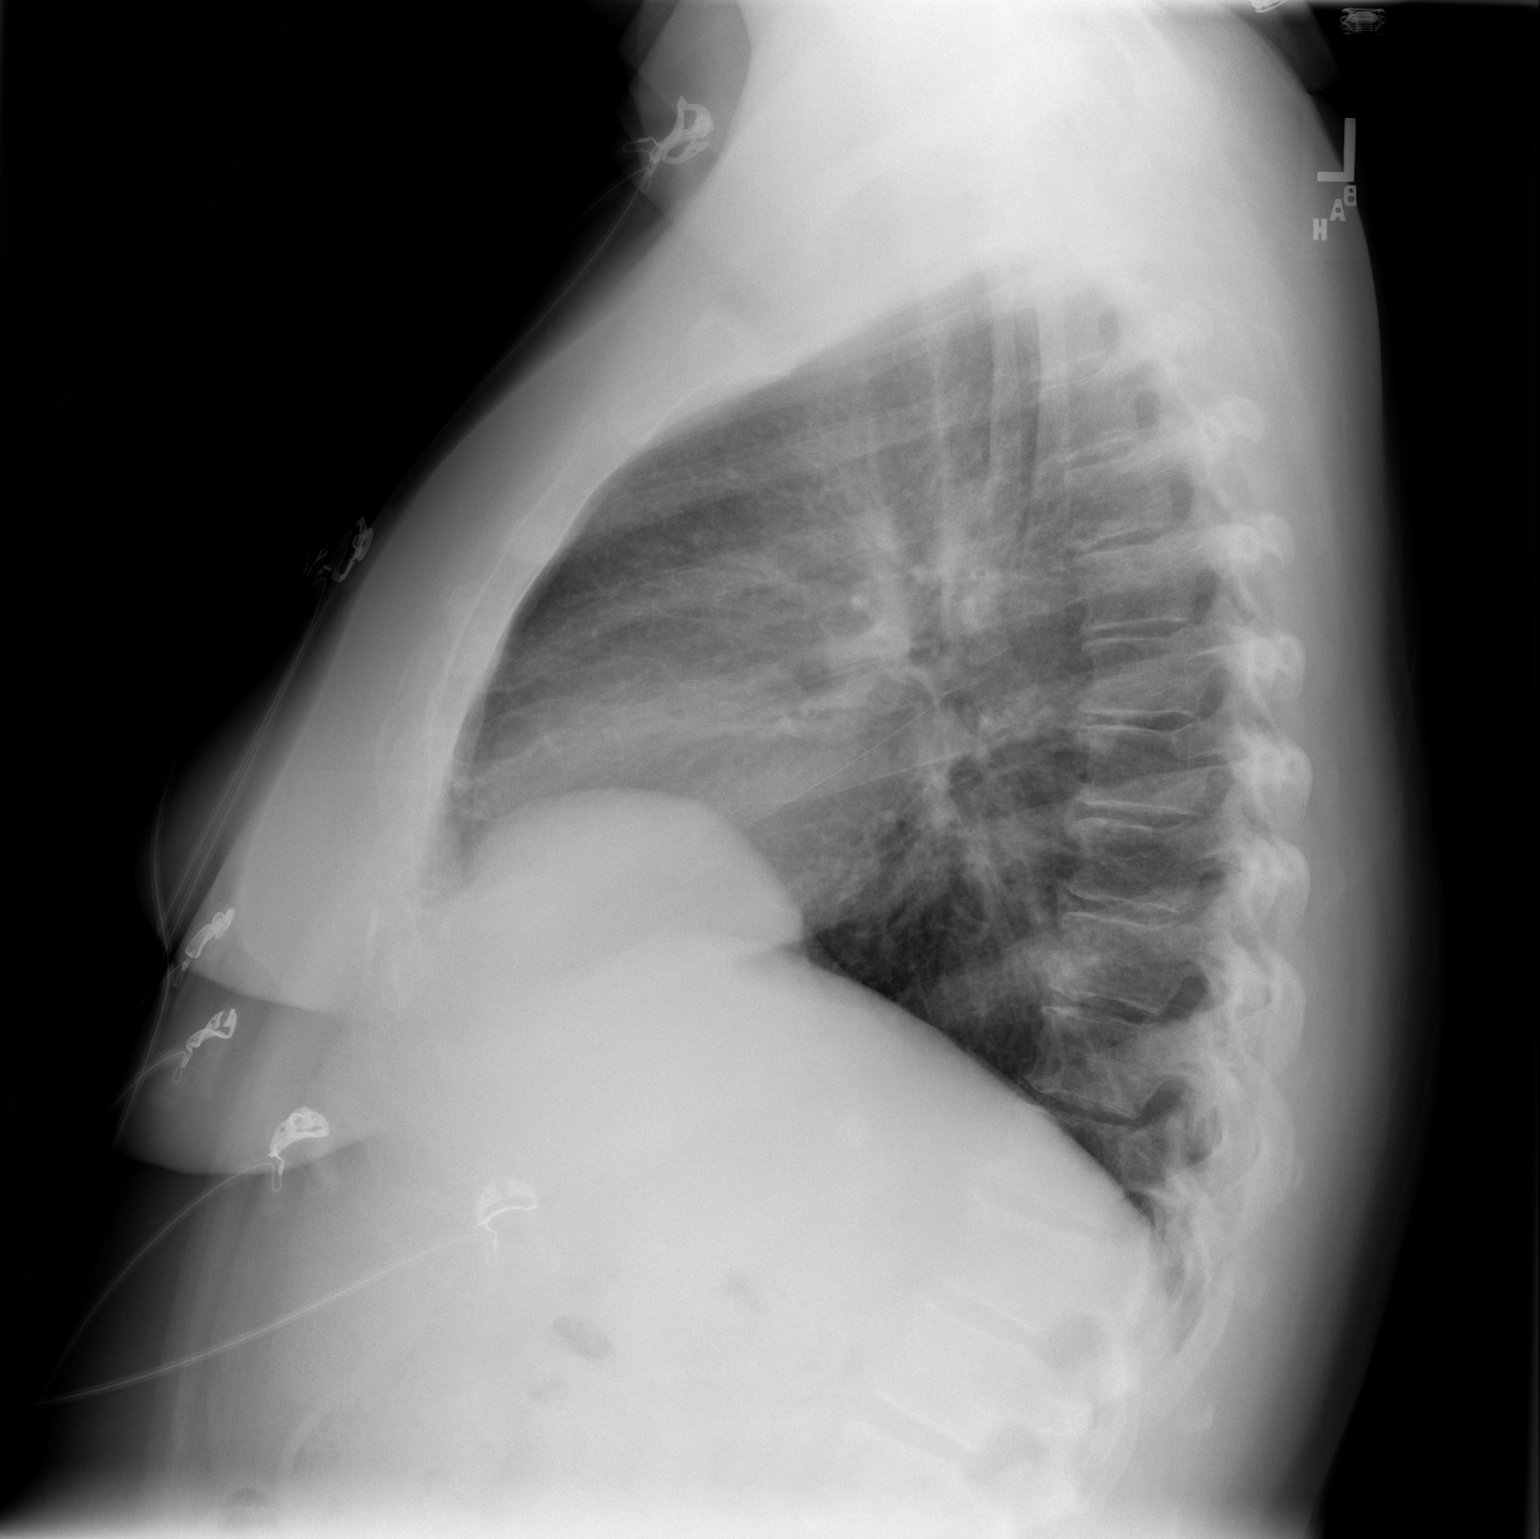

[2 of 2 positions shown; findings below may reference images not displayed]

FINDINGS: Two views of the chest demonstrate stable eventration
along the anterior right hemidiaphragm.  The patient has prominent
interstitial densities that appear chronic.  There is no focal
airspace disease.  Cardiac silhouette is normal for size and
contour.  No evidence for focal airspace space disease or pleural
fluid.
IMPRESSION: Chronic lung changes without acute findings.

## 2010-09-23 LAB — DIFFERENTIAL
Basophils Absolute: 0 10*3/uL (ref 0.0–0.1)
Basophils Relative: 0 % (ref 0–1)
Eosinophils Absolute: 0.3 10*3/uL (ref 0.0–0.7)
Eosinophils Relative: 3 % (ref 0–5)
Lymphocytes Relative: 30 % (ref 12–46)
Lymphs Abs: 2.8 10*3/uL (ref 0.7–4.0)
Monocytes Absolute: 0.7 10*3/uL (ref 0.1–1.0)
Monocytes Relative: 7 % (ref 3–12)
Neutro Abs: 5.5 10*3/uL (ref 1.7–7.7)
Neutrophils Relative %: 59 % (ref 43–77)

## 2010-09-23 LAB — CBC
HCT: 44.4 % (ref 36.0–46.0)
Hemoglobin: 15.2 g/dL — ABNORMAL HIGH (ref 12.0–15.0)
MCH: 31.5 pg (ref 26.0–34.0)
MCHC: 34.2 g/dL (ref 30.0–36.0)
MCV: 92.1 fL (ref 78.0–100.0)
Platelets: 232 10*3/uL (ref 150–400)
RBC: 4.82 MIL/uL (ref 3.87–5.11)
RDW: 13.8 % (ref 11.5–15.5)
WBC: 9.4 10*3/uL (ref 4.0–10.5)

## 2010-09-23 LAB — COMPREHENSIVE METABOLIC PANEL
ALT: 17 U/L (ref 0–35)
AST: 18 U/L (ref 0–37)
Albumin: 3.5 g/dL (ref 3.5–5.2)
Alkaline Phosphatase: 66 U/L (ref 39–117)
BUN: 6 mg/dL (ref 6–23)
CO2: 27 mEq/L (ref 19–32)
Calcium: 9.1 mg/dL (ref 8.4–10.5)
Chloride: 98 mEq/L (ref 96–112)
Creatinine, Ser: 0.82 mg/dL (ref 0.4–1.2)
GFR calc Af Amer: >60 mL/min (ref >60)
GFR calc non Af Amer: >60 mL/min (ref >60)
Glucose, Bld: 258 mg/dL — ABNORMAL HIGH (ref 70–99)
Potassium: 4.4 mEq/L (ref 3.5–5.1)
Sodium: 133 mEq/L — ABNORMAL LOW (ref 135–145)
Total Bilirubin: 0.5 mg/dL (ref 0.3–1.2)
Total Protein: 7.5 g/dL (ref 6.0–8.3)

## 2010-09-23 LAB — CK TOTAL AND CKMB (NOT AT ARMC)
CK, MB: 1.6 ng/mL (ref 0.3–4.0)
Relative Index: INVALID (ref 0.0–2.5)
Total CK: 64 U/L (ref 7–177)

## 2010-09-23 LAB — PROTIME-INR
INR: 0.95 (ref 0.00–1.49)
Prothrombin Time: 12.9 seconds (ref 11.6–15.2)

## 2010-09-23 LAB — TROPONIN I: Troponin I: 0.01 ng/mL (ref 0.00–0.06)

## 2010-09-23 LAB — APTT: aPTT: 30 seconds (ref 24–37)

## 2010-09-23 LAB — GLUCOSE, CAPILLARY: Glucose-Capillary: 250 mg/dL — ABNORMAL HIGH (ref 70–99)

## 2010-09-25 LAB — DIFFERENTIAL
Basophils Absolute: 0 10*3/uL (ref 0.0–0.1)
Basophils Relative: 0 % (ref 0–1)
Eosinophils Absolute: 0.3 10*3/uL (ref 0.0–0.7)
Eosinophils Relative: 3 % (ref 0–5)
Lymphocytes Relative: 31 % (ref 12–46)
Lymphs Abs: 3.5 10*3/uL (ref 0.7–4.0)
Monocytes Absolute: 0.9 10*3/uL (ref 0.1–1.0)
Monocytes Relative: 8 % (ref 3–12)
Neutro Abs: 6.5 10*3/uL (ref 1.7–7.7)
Neutrophils Relative %: 58 % (ref 43–77)

## 2010-09-25 LAB — LIPASE, BLOOD: Lipase: 52 U/L (ref 11–59)

## 2010-09-25 LAB — URINALYSIS, ROUTINE W REFLEX MICROSCOPIC
Bilirubin Urine: NEGATIVE
Glucose, UA: >1000 mg/dL — AB
Hgb urine dipstick: NEGATIVE
Ketones, ur: 15 mg/dL — AB
Leukocytes, UA: NEGATIVE
Nitrite: NEGATIVE
Protein, ur: NEGATIVE mg/dL
Specific Gravity, Urine: 1.031 — ABNORMAL HIGH (ref 1.005–1.030)
Urobilinogen, UA: 0.2 mg/dL (ref 0.0–1.0)
pH: 6 (ref 5.0–8.0)

## 2010-09-25 LAB — CBC
HCT: 43.3 % (ref 36.0–46.0)
Hemoglobin: 14.8 g/dL (ref 12.0–15.0)
MCH: 31.6 pg (ref 26.0–34.0)
MCHC: 34.2 g/dL (ref 30.0–36.0)
MCV: 92.3 fL (ref 78.0–100.0)
Platelets: 226 10*3/uL (ref 150–400)
RBC: 4.69 MIL/uL (ref 3.87–5.11)
RDW: 14.7 % (ref 11.5–15.5)
WBC: 11.2 10*3/uL — ABNORMAL HIGH (ref 4.0–10.5)

## 2010-09-25 LAB — COMPREHENSIVE METABOLIC PANEL
ALT: 16 U/L (ref 0–35)
AST: 15 U/L (ref 0–37)
Albumin: 3.3 g/dL — ABNORMAL LOW (ref 3.5–5.2)
Alkaline Phosphatase: 101 U/L (ref 39–117)
BUN: 10 mg/dL (ref 6–23)
CO2: 26 mEq/L (ref 19–32)
Calcium: 9.6 mg/dL (ref 8.4–10.5)
Chloride: 98 mEq/L (ref 96–112)
Creatinine, Ser: 0.83 mg/dL (ref 0.4–1.2)
GFR calc Af Amer: >60 mL/min (ref >60)
GFR calc non Af Amer: >60 mL/min (ref >60)
Glucose, Bld: 335 mg/dL — ABNORMAL HIGH (ref 70–99)
Potassium: 4 mEq/L (ref 3.5–5.1)
Sodium: 135 mEq/L (ref 135–145)
Total Bilirubin: 0.3 mg/dL (ref 0.3–1.2)
Total Protein: 7.6 g/dL (ref 6.0–8.3)

## 2010-09-25 LAB — POCT PREGNANCY, URINE: Preg Test, Ur: NEGATIVE

## 2010-09-25 LAB — POCT CARDIAC MARKERS
CKMB, poc: 1.5 ng/mL (ref 1.0–8.0)
Myoglobin, poc: 43.5 ng/mL (ref 12–200)
Troponin i, poc: <0.05 ng/mL (ref 0.00–0.09)

## 2010-09-25 LAB — URINE CULTURE: Colony Count: >=100000

## 2010-09-25 LAB — URINE MICROSCOPIC-ADD ON

## 2010-09-27 LAB — DIFFERENTIAL
Basophils Absolute: 0.1 10*3/uL (ref 0.0–0.1)
Basophils Relative: 1 % (ref 0–1)
Eosinophils Absolute: 0.3 10*3/uL (ref 0.0–0.7)
Eosinophils Relative: 3 % (ref 0–5)
Lymphocytes Relative: 26 % (ref 12–46)
Lymphs Abs: 2.2 10*3/uL (ref 0.7–4.0)
Monocytes Absolute: 0.6 10*3/uL (ref 0.1–1.0)
Monocytes Relative: 7 % (ref 3–12)
Neutro Abs: 5.5 10*3/uL (ref 1.7–7.7)
Neutrophils Relative %: 64 % (ref 43–77)

## 2010-09-27 LAB — URINALYSIS, ROUTINE W REFLEX MICROSCOPIC
Bilirubin Urine: NEGATIVE
Glucose, UA: >1000 mg/dL — AB
Hgb urine dipstick: NEGATIVE
Ketones, ur: 15 mg/dL — AB
Leukocytes, UA: NEGATIVE
Nitrite: NEGATIVE
Protein, ur: NEGATIVE mg/dL
Specific Gravity, Urine: 1.037 — ABNORMAL HIGH (ref 1.005–1.030)
Urobilinogen, UA: 0.2 mg/dL (ref 0.0–1.0)
pH: 5.5 (ref 5.0–8.0)

## 2010-09-27 LAB — POCT I-STAT, CHEM 8
BUN: 14 mg/dL (ref 6–23)
Calcium, Ion: 1.17 mmol/L (ref 1.12–1.32)
Chloride: 100 mEq/L (ref 96–112)
Creatinine, Ser: 0.8 mg/dL (ref 0.4–1.2)
Glucose, Bld: 388 mg/dL — ABNORMAL HIGH (ref 70–99)
HCT: 47 % — ABNORMAL HIGH (ref 36.0–46.0)
Hemoglobin: 16 g/dL — ABNORMAL HIGH (ref 12.0–15.0)
Potassium: 4.3 mEq/L (ref 3.5–5.1)
Sodium: 134 mEq/L — ABNORMAL LOW (ref 135–145)
TCO2: 26 mmol/L (ref 0–100)

## 2010-09-27 LAB — POCT CARDIAC MARKERS
CKMB, poc: 1.8 ng/mL (ref 1.0–8.0)
CKMB, poc: 2.1 ng/mL (ref 1.0–8.0)
Myoglobin, poc: 43 ng/mL (ref 12–200)
Myoglobin, poc: 43.2 ng/mL (ref 12–200)
Troponin i, poc: <0.05 ng/mL (ref 0.00–0.09)
Troponin i, poc: <0.05 ng/mL (ref 0.00–0.09)

## 2010-09-27 LAB — URINE MICROSCOPIC-ADD ON

## 2010-09-27 LAB — CBC
HCT: 42.8 % (ref 36.0–46.0)
Hemoglobin: 15 g/dL (ref 12.0–15.0)
MCHC: 35 g/dL (ref 30.0–36.0)
MCV: 92.7 fL (ref 78.0–100.0)
Platelets: 219 10*3/uL (ref 150–400)
RBC: 4.62 MIL/uL (ref 3.87–5.11)
RDW: 14.4 % (ref 11.5–15.5)
WBC: 8.6 10*3/uL (ref 4.0–10.5)

## 2010-09-27 LAB — POCT PREGNANCY, URINE: Preg Test, Ur: NEGATIVE

## 2010-09-27 LAB — D-DIMER, QUANTITATIVE: D-Dimer, Quant: <0.22 ug/mL-FEU (ref 0.00–0.48)

## 2010-10-20 LAB — POCT PREGNANCY, URINE: Preg Test, Ur: NEGATIVE

## 2010-10-20 LAB — CK TOTAL AND CKMB (NOT AT ARMC)
CK, MB: 1 ng/mL (ref 0.3–4.0)
Relative Index: INVALID (ref 0.0–2.5)
Total CK: 65 U/L (ref 7–177)

## 2010-10-20 LAB — DIFFERENTIAL
Basophils Absolute: 0 10*3/uL (ref 0.0–0.1)
Basophils Absolute: 0.1 10*3/uL (ref 0.0–0.1)
Basophils Relative: 0 % (ref 0–1)
Basophils Relative: 1 % (ref 0–1)
Eosinophils Absolute: 0.2 10*3/uL (ref 0.0–0.7)
Eosinophils Absolute: 0.3 10*3/uL (ref 0.0–0.7)
Eosinophils Relative: 2 % (ref 0–5)
Eosinophils Relative: 3 % (ref 0–5)
Lymphocytes Relative: 18 % (ref 12–46)
Lymphocytes Relative: 20 % (ref 12–46)
Lymphs Abs: 2 10*3/uL (ref 0.7–4.0)
Lymphs Abs: 2.5 10*3/uL (ref 0.7–4.0)
Monocytes Absolute: 0.5 10*3/uL (ref 0.1–1.0)
Monocytes Absolute: 0.8 10*3/uL (ref 0.1–1.0)
Monocytes Relative: 4 % (ref 3–12)
Monocytes Relative: 6 % (ref 3–12)
Neutro Abs: 10 10*3/uL — ABNORMAL HIGH (ref 1.7–7.7)
Neutro Abs: 7.6 10*3/uL (ref 1.7–7.7)
Neutrophils Relative %: 73 % (ref 43–77)
Neutrophils Relative %: 74 % (ref 43–77)

## 2010-10-20 LAB — URINALYSIS, ROUTINE W REFLEX MICROSCOPIC
Bilirubin Urine: NEGATIVE
Glucose, UA: >1000 mg/dL — AB
Hgb urine dipstick: NEGATIVE
Ketones, ur: NEGATIVE mg/dL
Leukocytes, UA: NEGATIVE
Nitrite: NEGATIVE
Protein, ur: NEGATIVE mg/dL
Specific Gravity, Urine: 1.019 (ref 1.005–1.030)
Urobilinogen, UA: 0.2 mg/dL (ref 0.0–1.0)
pH: 5.5 (ref 5.0–8.0)

## 2010-10-20 LAB — CBC
HCT: 45.9 % (ref 36.0–46.0)
HCT: 49.1 % — ABNORMAL HIGH (ref 36.0–46.0)
Hemoglobin: 15.7 g/dL — ABNORMAL HIGH (ref 12.0–15.0)
Hemoglobin: 16.9 g/dL — ABNORMAL HIGH (ref 12.0–15.0)
MCHC: 34.1 g/dL (ref 30.0–36.0)
MCHC: 34.5 g/dL (ref 30.0–36.0)
MCV: 87.6 fL (ref 78.0–100.0)
MCV: 87.9 fL (ref 78.0–100.0)
Platelets: 245 10*3/uL (ref 150–400)
Platelets: 261 10*3/uL (ref 150–400)
RBC: 5.23 MIL/uL — ABNORMAL HIGH (ref 3.87–5.11)
RBC: 5.61 MIL/uL — ABNORMAL HIGH (ref 3.87–5.11)
RDW: 13.8 % (ref 11.5–15.5)
RDW: 14 % (ref 11.5–15.5)
WBC: 10.3 10*3/uL (ref 4.0–10.5)
WBC: 13.6 10*3/uL — ABNORMAL HIGH (ref 4.0–10.5)

## 2010-10-20 LAB — BASIC METABOLIC PANEL
BUN: 7 mg/dL (ref 6–23)
CO2: 27 mEq/L (ref 19–32)
Calcium: 8.8 mg/dL (ref 8.4–10.5)
Chloride: 98 mEq/L (ref 96–112)
Creatinine, Ser: 0.78 mg/dL (ref 0.4–1.2)
GFR calc Af Amer: >60 mL/min (ref >60)
GFR calc non Af Amer: >60 mL/min (ref >60)
Glucose, Bld: 247 mg/dL — ABNORMAL HIGH (ref 70–99)
Potassium: 3.6 mEq/L (ref 3.5–5.1)
Sodium: 134 mEq/L — ABNORMAL LOW (ref 135–145)

## 2010-10-20 LAB — PROTIME-INR
INR: 0.9 (ref 0.00–1.49)
INR: 0.9 (ref 0.00–1.49)
Prothrombin Time: 12.5 seconds (ref 11.6–15.2)
Prothrombin Time: 12.6 seconds (ref 11.6–15.2)

## 2010-10-20 LAB — POCT I-STAT, CHEM 8
BUN: 12 mg/dL (ref 6–23)
Calcium, Ion: 0.86 mmol/L — ABNORMAL LOW (ref 1.12–1.32)
Chloride: 103 mEq/L (ref 96–112)
Creatinine, Ser: <0.2 mg/dL — ABNORMAL LOW (ref 0.4–1.2)
Glucose, Bld: 317 mg/dL — ABNORMAL HIGH (ref 70–99)
HCT: 53 % — ABNORMAL HIGH (ref 36.0–46.0)
Hemoglobin: 18 g/dL — ABNORMAL HIGH (ref 12.0–15.0)
Potassium: 4.5 mEq/L (ref 3.5–5.1)
Sodium: 127 mEq/L — ABNORMAL LOW (ref 135–145)
TCO2: 12 mmol/L (ref 0–100)

## 2010-10-20 LAB — RAPID URINE DRUG SCREEN, HOSP PERFORMED
Amphetamines: NOT DETECTED
Barbiturates: NOT DETECTED
Benzodiazepines: NOT DETECTED
Cocaine: NOT DETECTED
Opiates: POSITIVE — AB
Tetrahydrocannabinol: NOT DETECTED

## 2010-10-20 LAB — COMPREHENSIVE METABOLIC PANEL
ALT: 43 U/L — ABNORMAL HIGH (ref 0–35)
AST: 35 U/L (ref 0–37)
Albumin: 3.5 g/dL (ref 3.5–5.2)
Alkaline Phosphatase: 107 U/L (ref 39–117)
BUN: 11 mg/dL (ref 6–23)
CO2: 26 mEq/L (ref 19–32)
Calcium: 9.4 mg/dL (ref 8.4–10.5)
Chloride: 94 mEq/L — ABNORMAL LOW (ref 96–112)
Creatinine, Ser: 0.64 mg/dL (ref 0.4–1.2)
GFR calc Af Amer: >60 mL/min (ref >60)
GFR calc non Af Amer: >60 mL/min (ref >60)
Glucose, Bld: 358 mg/dL — ABNORMAL HIGH (ref 70–99)
Potassium: 3.9 mEq/L (ref 3.5–5.1)
Sodium: 129 mEq/L — ABNORMAL LOW (ref 135–145)
Total Bilirubin: 0.6 mg/dL (ref 0.3–1.2)
Total Protein: 7.6 g/dL (ref 6.0–8.3)

## 2010-10-20 LAB — GLUCOSE, CAPILLARY
Glucose-Capillary: 283 mg/dL — ABNORMAL HIGH (ref 70–99)
Glucose-Capillary: 292 mg/dL — ABNORMAL HIGH (ref 70–99)
Glucose-Capillary: 314 mg/dL — ABNORMAL HIGH (ref 70–99)
Glucose-Capillary: 317 mg/dL — ABNORMAL HIGH (ref 70–99)
Glucose-Capillary: 363 mg/dL — ABNORMAL HIGH (ref 70–99)

## 2010-10-20 LAB — APTT
aPTT: 29 seconds (ref 24–37)
aPTT: 29 seconds (ref 24–37)

## 2010-10-20 LAB — POCT CARDIAC MARKERS
CKMB, poc: <1 ng/mL — ABNORMAL LOW (ref 1.0–8.0)
Myoglobin, poc: 52.9 ng/mL (ref 12–200)
Troponin i, poc: <0.05 ng/mL (ref 0.00–0.09)

## 2010-10-20 LAB — TROPONIN I: Troponin I: <0.01 ng/mL (ref 0.00–0.06)

## 2010-10-20 LAB — URINE MICROSCOPIC-ADD ON

## 2010-10-25 LAB — CARDIAC PANEL(CRET KIN+CKTOT+MB+TROPI)
CK, MB: 1 ng/mL (ref 0.3–4.0)
CK, MB: 1.1 ng/mL (ref 0.3–4.0)
Relative Index: INVALID (ref 0.0–2.5)
Relative Index: INVALID (ref 0.0–2.5)
Total CK: 69 U/L (ref 7–177)
Total CK: 78 U/L (ref 7–177)
Troponin I: 0.01 ng/mL (ref 0.00–0.06)
Troponin I: 0.02 ng/mL (ref 0.00–0.06)

## 2010-10-25 LAB — DIFFERENTIAL
Basophils Absolute: 0.1 10*3/uL (ref 0.0–0.1)
Basophils Relative: 1 % (ref 0–1)
Eosinophils Absolute: 0.4 10*3/uL (ref 0.0–0.7)
Eosinophils Relative: 3 % (ref 0–5)
Lymphocytes Relative: 24 % (ref 12–46)
Lymphs Abs: 3.4 10*3/uL (ref 0.7–4.0)
Monocytes Absolute: 0.7 10*3/uL (ref 0.1–1.0)
Monocytes Relative: 5 % (ref 3–12)
Neutro Abs: 9.5 10*3/uL — ABNORMAL HIGH (ref 1.7–7.7)
Neutrophils Relative %: 67 % (ref 43–77)

## 2010-10-25 LAB — TSH: TSH: 1.596 u[IU]/mL (ref 0.350–4.500)

## 2010-10-25 LAB — CBC
HCT: 46 % (ref 36.0–46.0)
Hemoglobin: 15.4 g/dL — ABNORMAL HIGH (ref 12.0–15.0)
MCHC: 33.6 g/dL (ref 30.0–36.0)
MCV: 87.2 fL (ref 78.0–100.0)
Platelets: 277 10*3/uL (ref 150–400)
RBC: 5.27 MIL/uL — ABNORMAL HIGH (ref 3.87–5.11)
RDW: 14.4 % (ref 11.5–15.5)
WBC: 14.2 10*3/uL — ABNORMAL HIGH (ref 4.0–10.5)

## 2010-10-25 LAB — COMPREHENSIVE METABOLIC PANEL
ALT: 29 U/L (ref 0–35)
AST: 24 U/L (ref 0–37)
Albumin: 3.9 g/dL (ref 3.5–5.2)
Alkaline Phosphatase: 103 U/L (ref 39–117)
BUN: 9 mg/dL (ref 6–23)
CO2: 27 mEq/L (ref 19–32)
Calcium: 9.4 mg/dL (ref 8.4–10.5)
Chloride: 95 mEq/L — ABNORMAL LOW (ref 96–112)
Creatinine, Ser: 0.68 mg/dL (ref 0.4–1.2)
GFR calc Af Amer: >60 mL/min (ref >60)
GFR calc non Af Amer: >60 mL/min (ref >60)
Glucose, Bld: 297 mg/dL — ABNORMAL HIGH (ref 70–99)
Potassium: 4 mEq/L (ref 3.5–5.1)
Sodium: 132 mEq/L — ABNORMAL LOW (ref 135–145)
Total Bilirubin: 0.9 mg/dL (ref 0.3–1.2)
Total Protein: 7.8 g/dL (ref 6.0–8.3)

## 2010-10-25 LAB — BASIC METABOLIC PANEL
BUN: 9 mg/dL (ref 6–23)
CO2: 27 mEq/L (ref 19–32)
Calcium: 9.5 mg/dL (ref 8.4–10.5)
Chloride: 94 mEq/L — ABNORMAL LOW (ref 96–112)
Creatinine, Ser: 0.72 mg/dL (ref 0.4–1.2)
GFR calc Af Amer: >60 mL/min (ref >60)
GFR calc non Af Amer: >60 mL/min (ref >60)
Glucose, Bld: 335 mg/dL — ABNORMAL HIGH (ref 70–99)
Potassium: 4.3 mEq/L (ref 3.5–5.1)
Sodium: 132 mEq/L — ABNORMAL LOW (ref 135–145)

## 2010-10-25 LAB — POCT CARDIAC MARKERS
CKMB, poc: <1 ng/mL — ABNORMAL LOW (ref 1.0–8.0)
Myoglobin, poc: 54.9 ng/mL (ref 12–200)
Troponin i, poc: <0.05 ng/mL (ref 0.00–0.09)

## 2010-10-25 LAB — POCT I-STAT, CHEM 8
BUN: 11 mg/dL (ref 6–23)
Calcium, Ion: 1.11 mmol/L — ABNORMAL LOW (ref 1.12–1.32)
Chloride: 99 mEq/L (ref 96–112)
Creatinine, Ser: 0.8 mg/dL (ref 0.4–1.2)
Glucose, Bld: 337 mg/dL — ABNORMAL HIGH (ref 70–99)
HCT: 49 % — ABNORMAL HIGH (ref 36.0–46.0)
Hemoglobin: 16.7 g/dL — ABNORMAL HIGH (ref 12.0–15.0)
Potassium: 4.1 mEq/L (ref 3.5–5.1)
Sodium: 133 mEq/L — ABNORMAL LOW (ref 135–145)
TCO2: 26 mmol/L (ref 0–100)

## 2010-10-25 LAB — GLUCOSE, CAPILLARY
Glucose-Capillary: 354 mg/dL — ABNORMAL HIGH (ref 70–99)
Glucose-Capillary: 356 mg/dL — ABNORMAL HIGH (ref 70–99)
Glucose-Capillary: 203 mg/dL — ABNORMAL HIGH (ref 70–99)
Glucose-Capillary: 216 mg/dL — ABNORMAL HIGH (ref 70–99)
Glucose-Capillary: 222 mg/dL — ABNORMAL HIGH (ref 70–99)
Glucose-Capillary: 227 mg/dL — ABNORMAL HIGH (ref 70–99)
Glucose-Capillary: 246 mg/dL — ABNORMAL HIGH (ref 70–99)
Glucose-Capillary: 257 mg/dL — ABNORMAL HIGH (ref 70–99)
Glucose-Capillary: 286 mg/dL — ABNORMAL HIGH (ref 70–99)
Glucose-Capillary: 295 mg/dL — ABNORMAL HIGH (ref 70–99)
Glucose-Capillary: 305 mg/dL — ABNORMAL HIGH (ref 70–99)
Glucose-Capillary: 306 mg/dL — ABNORMAL HIGH (ref 70–99)
Glucose-Capillary: 378 mg/dL — ABNORMAL HIGH (ref 70–99)

## 2010-10-25 LAB — BRAIN NATRIURETIC PEPTIDE: Pro B Natriuretic peptide (BNP): 121 pg/mL — ABNORMAL HIGH (ref 0.0–100.0)

## 2010-10-25 LAB — TROPONIN I: Troponin I: <0.01 ng/mL (ref 0.00–0.06)

## 2010-10-25 LAB — LIPID PANEL
Cholesterol: 172 mg/dL (ref 0–200)
HDL: 24 mg/dL — ABNORMAL LOW (ref >39)
LDL Cholesterol: 78 mg/dL (ref 0–99)
Total CHOL/HDL Ratio: 7.2 RATIO
Triglycerides: 348 mg/dL — ABNORMAL HIGH (ref <150)
VLDL: 70 mg/dL — ABNORMAL HIGH (ref 0–40)

## 2010-10-25 LAB — CK TOTAL AND CKMB (NOT AT ARMC)
CK, MB: 1.4 ng/mL (ref 0.3–4.0)
Relative Index: 1 (ref 0.0–2.5)
Total CK: 138 U/L (ref 7–177)

## 2010-10-25 LAB — MAGNESIUM: Magnesium: 2.4 mg/dL (ref 1.5–2.5)

## 2010-10-25 LAB — PREGNANCY, URINE: Preg Test, Ur: NEGATIVE

## 2010-11-23 NOTE — H&P (Signed)
NAMEANGIE, PIERCEY               ACCOUNT NO.:  [id number]   MEDICAL RECORD NO.:  [id number]          PATIENT TYPE:  INP   LOCATION:  3010                         FACILITY:  [facility]   PHYSICIAN:  Paula Compton, MD        DATE OF BIRTH:  28-Oct-1970   DATE OF ADMISSION:  10/20/2008  DATE OF DISCHARGE:                              HISTORY & PHYSICAL   PRIMARY CARE PHYSICIAN:  [name], MD, at the [facility].   CHIEF COMPLAINT:  Speech changes and left-sided weakness.   HISTORY OF PRESENT ILLNESS:  Ms. Suttles is a 40 year old female with a  past medical history significant for negative previous neuro workup for  weakness, who presents with the left-sided weakness and speech changes.  She presented with similar symptoms 2 days prior to admission and was  found to have no focal weakness and normal CT of the head, so she was  sent home.  She returns today with a complaint of headache that  responded to Vicodin x1, but with residual speech changes and left-sided  weakness.  Speech changes are described as slurred speech by her  husband, but actually appear to be talking inappropriately loud as  though she is unable to modulate her volume.  Her weakness is minimal at  best, and she is able to move all 4 extremities, but uses a wheelchair  at baseline.  Her husband states that he is worried to take her home as  she is not quite acting herself and in addition to her speech changes  she has been forgetful for the past few days.  Repeat CT of the head is  negative in the ED today.  MRI/MRA are pending.   MEDICATIONS:  1. Albuterol 90 mcg inhale 2 puffs every 4 hours as needed for      shortness of breath or wheeze.  2. NovoLog 70/30 mix 40 units in the morning and 40 units in the      evening.  3. Advair Diskus 500/50 mcg 1 puff in the morning and 1 puff in the      evening.  4. Fluoxetine 40 mg capsule 1 tab by mouth daily.  5. Simvastatin 40 mg tabs 1 by  mouth at bedtime.  6. Aspirin 81 mg take 1 by mouth daily.  7. MiraLax 17 g dissolving in 8 ounces of water daily for      constipation.  8. Compression stocking to wear in the lower extremity for swelling      p.r.n.  9. Furosemide 20 mg tabs 1 tablet by mouth daily as needed for lower      extremity swelling.  This was also p.r.n. medications.  10.Ultram 50 mg tabs 1 tab by mouth every 4-6 hours as needed for      breakthrough pain.   ALLERGIES:  1. PENICILLIN.  2. SINGULAIR.  3. MORPHINE.   PAST MEDICAL HISTORY:  Significant for:  1. Diabetes type 2.  2. History of DVT, left leg.  3. Asthma.  4. COPD.  5. Tobacco abuse.  6. Hyperlipidemia.  7. Diabetic peripheral neuropathy.  8. Perinephric abscess, status post percutaneous drain in May 2009.   SURGICAL HISTORY:  Laparoscopic surgery for endometriosis.   FAMILY HISTORY:  Diabetes, coronary artery disease, and history of DVTs.   SOCIAL HISTORY:  The patient lives with her husband and 2 sons.  She  smokes about half a pack per day.  She is currently able to work due to  limited ability to walk secondary to neuropathy and she is trying to get  disability at this time.  She denies alcohol and drugs.   REVIEW OF SYSTEMS:  The patient complains of weakness, wheezing, nausea,  headaches, numbness and tingling in her extremities, and excessive  thirst and urination.  She denies fever, chills, malaise, sweats, double  vision, blurry vision, eye pain, difficulty swallowing, nasal  congestion, sinus pressure, sore throat, chest pain, shortness of  breath, fatigue, fainting, lightheadedness, palpitations, swelling of  her feet or hands, cough, sputum production, shortness of breath,  abdominal pain, changes in her bowel habit, joint pain or swelling,  difficulty with concentration or coordination. seizures, or tremors.   PHYSICAL EXAMINATION:  VITAL SIGNS:  Temperature 97, blood pressure  148/88, pulse 95, respiratory rate 20,  and O2 sat 98% on room air.  GENERAL:  She is alert, well developed, well nourished, well hydrated,  and overweight appearing.  HEENT: Normocephalic and atraumatic.  Vision grossly intact.  Pupils are  equally round and reactive to light.  Right and left ears are normal and  clear.  Pharynx is pink and moist.  She has poor dentition.  NECK:  Supple.  Full range of motion.  No masses.  LUNGS:  Normal respiratory effort.  No retractions.  Normal muscle use  with diffuse wheezes.  No crackles.  HEART:  Regular rate and rhythm.  No murmurs, rubs, or gallops.  ABDOMEN:  Soft and nontender.  Normoactive bowel sounds.  No  hepatosplenomegaly.  MUSCULOSKELETAL:  Normal range of motion.  No joint tenderness.  No  joint swelling.  No joint warmth.  Pulses full and equal bilaterally.  EXTREMITIES:  No clubbing, cyanosis, or edema.  NEURO:  Alert and oriented x3.  Cranial nerves II-XII are intact.  Strength is normal in all extremities with the exception of mild  decreased strength of the left upper extremities.  Sensation is intact  to light touch.  DTRs symmetric and normal.  Finger-to-nose is normal.  Toes are downgoing bilaterally.  She has loud speech that is monotone  and childlike.  SKIN:  Intact.  No rashes.  PSYCH:  She is alert and oriented.  Memory is intact to recent or  remote.  She has good eye contact.  She is not anxious appearing, not  depressed.   LABORATORY DATA AND STUDIES:  CT without contrast negative for acute  process.  White blood cell count 10.2, hemoglobin 16.9, hematocrit 49.1,  and platelets 245.  Sodium 127, potassium 4.5, chloride 103, CO2 of 12,  BUN 12, and creatinine 0.2.  PT 12.6, INR 0.2, and PTT 29.  Point-of-  care enzymes are negative.  UA is specific gravity 1.019, glucose  greater than 1000.  UPREG is negative.   ASSESSMENT/PLAN:  Ms. Bialas is a 40 year old female with speech changes  and left-sided weakness.  1. Speech changes and weakness:  The  patient has no obvious focal      deficits noted with slightly decreased strength on the left side,      this is most obvious  change.  Speech modulation which is per her      husband was normal prior to Saturday.  CT was negative x2.      Previous neuro workup was negative as well.  We will not consult      Neurology at this time.  The question of neuro source, we will      check MRI/MRA of the brain.  Question of psych versus gain as the      patient has disability pending.  We will get psych consult to      determine status, ST, PT/OT.  Check UDS.  2. Hyponatremia, possibly dehydrated:  We are going to give her normal      saline at 100 mL per hour, caution not to correct it too rapidly.  3. Diabetes mellitus, sliding scale insulin with basal Lantus 30 units      a bedtime..  4. Tobacco abuse, nicotine patch 7 mg daily.   DISPOSITION:  Pending MRI/MRA and psych evaluation.      Helane Rima, MD  Electronically Signed      Paula Compton, MD  Electronically Signed    EW/MEDQ  D:  10/20/2008  T:  10/21/2008  Job:  [id number]

## 2010-11-23 NOTE — Discharge Summary (Signed)
[name], [name]               ACCOUNT NO.:  [id number]   MEDICAL RECORD NO.:  [id number]          PATIENT TYPE:  INP   LOCATION:  5153                         FACILITY:  [facility]   PHYSICIAN:  Pearlean Brownie, M.D.DATE OF BIRTH:  January 26, 1971   DATE OF ADMISSION:  11/21/2007  DATE OF DISCHARGE:  11/28/2007                               DISCHARGE SUMMARY   PRIMARY CARE PHYSICIAN:  [name], MD, [name].   DISCHARGE DIAGNOSES:  1. Right perinephric fluid collection status post percutaneous drain.  2. Diabetes type 2.  3. Chronic obstructive pulmonary disease.  4. Hyperlipidemia.  5. Anxiety.   DISCHARGE MEDICATIONS:  1. Aspirin 81 mg p.o. daily.  2. Fluoxetine 10 mg p.o. daily.  3. Advair 500/50 one puff twice a daily.  4. Simvastatin 40 mg p.o. nightly.  5. Glipizide 10 mg p.o. b.i.d.  6. NovoLog 70, 30-40 units in the morning and 30 units in the evening.  7. Metformin 500 mg two tablets p.o. at dinner instructed to start on.  8. Albuterol 90 mcg 1-2 puffs every four hours with spacer as needed      for wheezing.  9. Vicodin 500/5 1-2 tablets p.o. q.6 h. p.r.n. for pain.  10.Cipro 500 mg p.o. b.i.d. for 7 days.   CONSULTS:  Neurology, interventional radiology.   PROCEDURES:  The patient had a percutaneous drain placed in the IVR on  Nov 23, 2007.   Labs upon admission showed a white blood cell count felt that due to  40.5, 78% neutrophils, 11.3 absolute neutrophils, hemoglobin 12.4,  platelets of 399.  Sodium was 130, potassium 3.6, chloride 92, bicarb  was 29, BUN 10, creatinine 1.07, glucose was 312.  T-bili is 0.5, total  protein 8, albumin 2.7, AST of 13, ALT of 17, lipase of 18, and calcium  of 9.4.  Initial urinalysis had specific gravity of 1.046, 15 ketones,  100 protein, leukocyte esterase and nitrites were negative.  It had 17  white blood cells.  Urine culture showed no growth.  Urine pregnancy was  also negative.  Hemoglobin A1c  was 12%.  The patient had blood cultures  x2.  The first one grew out bacillus species that was considered  contaminant.  Next blood culture was taken on the 14th, which shows no  growth to date.  The patient also has a CT of the abdomen, initial CT  performed that showed a right pylori with periodontal fluid and possible  pelvic abscess.  A culture of abscess was obtained grew out Staph.  aureus that was sensitive to ciprofloxacin and also to Bactrim.  The  patient had a repeat CT scan of the abdomen that showed near complete  resolution of the right perinephric fluid collection.  No evidence of  hydronephrosis or lymphadenopathy.  On date of discharge, the patient  had sodium of 131, potassium 4.8, creatinine 0.9, white blood cell count  of 14.8, hemoglobin 12.2, and platelets of 537.   BRIEF HOSPITAL COURSE:  1. This is a 40 year old female that was initially admitted because of      right-sided  abdominal pain and flank pain.  Upon admission, she was      found on CT to have a perinephric fluid collection of the right      kidney.  She was immediately placed on antibiotics given her fever      and severe pain.  She was initially placed on Cipro and Flagyl.      After that, she remained afebrile.  White blood cell count was      stable.  IVR and neurology were consulted.  According to the CT,      they were able to place a drain.  She had a percutaneous drain      placed on Nov 23, 2007, which drained appreciable amount of fluid.      It was kept in place for total of 4-1/2 days.  On the last day, Nov 27, 2007, it was noted that it had drained less than 10 mL of      sanguineous fluid; therefore, repeat CT was obtained and it showed      near complete resolution of the right perinephric fluid and      therefore the drain was removed.  During this time, she also had a      culture of the abscess that did grow out Staph. aureus and was      sensitive to the current medication she  was on.  She was continued      on Cipro and the plan is to complete a 14-day course of      ciprofloxacin which she has tolerated.  So for her labs are as      noted above and she is in stable condition for discharge and      followup if fevers return.  2. Diabetes.  The patient was noted to have a hemoglobin of 12% upon      admission.  She has a longstanding history of uncontrolled diabetes      despite being on medications.  She is current on NovoLog 70/30.      Upon admission, she was placed on Lantus and sliding scale insulin      resistant type.  She is going to be discharged on her home      medication list of glipizide, metformin, and I will continue her on      NovoLog 70/30 per home dose.  3. Hyperlipidemia.  She will continue with simvastatin.  4. COPD.  It was not an issue during this hospital stay.  She was      continued on her home medications, Advair, and received albuterol      nebs as needed.  5. Anxiety.  It was also not an issue during this hospitalization.      She was continued on fluoxetine.  6. Tobacco abuse.  She was placed on nicotine patch during her      hospital stay and did not have any withdrawal symptoms and will be      discharged on some nicotine patches.  She has been counseled on      this to quit.   Follow-up appointment is with Dr. [name] at [facility].  She has a scheduled appointment for Dec 07, 2007, at 2:10  p.m.   Please follow-up on her sodium at her next check and also on her white  blood cell count if she continues to have fevers.      [name], MD  Electronically Signed      Pearlean Brownie, M.D.  Electronically Signed    KL/MEDQ  D:  11/28/2007  T:  11/29/2007  Job:  [id number]

## 2010-11-23 NOTE — Letter (Signed)
July 15, 2008    [name], MD  [facility]  [address] [place] [address]   RE:  SAIA, DEROSSETT  MRN:  [id number]  /  DOB:  08/06/1970   Dear Dr. Burnadette Pop:   It was a pleasure to see Dr. Fayrene Fearing Hochrein's patient today in  consultation because of recurrent syncope.   She is a 40 year old with a complex past medical and psychosocial  history who has a history of syncope that began a couple of years ago.  The first episode that she recalls occurred while in the shower.  She  awakened with tingling of her face and residual fatigue.   About a year ago while working at [employer], she got quite hot and had  another episode.  She was told that she was dehydrated.  It was  associated with similar recovery symptoms.   Over the last 2 weeks; however, there has been a flurry of episodes  occurring almost daily and sometimes multiple times during the day.  They occur independent of position including while lying down.  They  last seconds to a minute or two.  They occur with no warning.  She was  described by her mother who accompanies today as being pale.  They are  associated with residual fatigue.  The patient was tingling, some  nausea, but no diaphoresis.  She has also had orthostatic intolerance in  the last week or two.  She denies antecedent viral illness.   As noted, she has a significant history of anxiety that dates back a  long time.  She outlined some of the issues today related to her 2  adopted sons who have bipolar disorder and there has been a significant  uptake in psychosocial stress over the last 2-4 weeks, related primarily  with family issues.   She has a longstanding history of diabetes, dating back about 20 years I  gather.   She has seen Dr. Antoine Poche in the past for chest pain and underwent  Myoview scanning that was normal and she also had normal left  ventricular function.  It was noted also when she saw him that her heart  was 109.  There was a comment in the old chart which she did not relate  to be that she uses home O2 with her COPD.  She continues to smoke.   PAST MEDICAL HISTORY:  1. Notable for COPD/asthma.  2. Diabetes.  3. Dyslipidemia.  4. Anxiety/depression.  5. Equivocal history of prior DVT, mentioned in the notes, but denied      by the patient.   PAST SURGICAL HISTORY:  Notable for uterine surgeries for endometriosis.   MEDICATIONS:  1. Albuterol inhalers.  2. NovoLog.  3. Fluoxetine 40.  4. Simvastatin 40.  5. Aspirin 81.  6. Lasix 20.   ALLERGIES:  The patient is allergic to PENICILLIN and SINGULAIR.   PHYSICAL EXAMINATION:  GENERAL:  Her blood pressure is 132/87 with the  pulse of 89.  With 5 minutes of prolonged standing, her heart rate went  to 105 and her blood pressures did not change, her diastolic went to a  size 93.  HEENT:  No icterus or xanthoma.  NECK:  The neck veins were flat.  The carotids are brisk and full  bilaterally out bruits.  BACK:  Without kyphosis, scoliosis.  LUNGS:  Clear.  HEART:  Heart sounds were regular without murmurs or gallops.  ABDOMEN:  Soft with active bowel sounds  without midline pulsation or  hepatomegaly.  Femoral pulses were 2+, distal pulses were intact.  There  is no clubbing, cyanosis, or edema.  NEUROLOGICAL:  Grossly normal.  Her skin was warm and dry.   Electrocardiogram dated today demonstrated sinus rhythm at 94 with  intervals of 0.13/0.19/0.37.  Right after the electrocardiogram was  taken, the patient had a syncopal episode.  The mother said, oh there  she is having one and the EMT turned around and rerecorded  electrocardiogram, she noted no change in the heart rate and rhythm  during the episode.   IMPRESSION:  1. Recurrent spells likely neurally mediated, but without      documentation of either bradycardia or hypotension.  2. Psychosocial issues include anxiety/depression.  3. Poorly controlled diabetes x20 years,  question contributing to      autonomic neuropathy.  4. Dyslipidemia.  5. History of sinus tachycardia.   DISCUSSION:  Dr. Burnadette Pop, Mrs. Goeden is having recurrent spells with  a serious uptake in frequency over last couple of weeks.  Looking for  primary event, that could trigger this, would include CBC, electrolytes  which unfortunately failed ordered today.  Psychosocial issues, which we  discussed at some length and I suggested that it will be important for  her to get these addressed formally as the data from [place] years ago  would suggest that there will be likely poor improvement in symptoms  without addressing them.   It is unlikely, but possible that Reola Calkins  mechanisms could  underlie a uptake in spells and with her longstanding diabetes, she  certainly is at risk for coronary artery disease.  She had negative  Myoview about 18 months ago.  We will plan to repeat it to make sure  there is no evidence of inferior ischemia.   We will check blood work when she comes back in a couple of weeks.   Thank you very much for the consultation.    Sincerely,      Duke Salvia, MD, [facility]  Electronically Signed    SCK/MedQ  DD: 07/15/2008  DT: 07/16/2008  Job #: [id number]

## 2010-11-23 NOTE — Assessment & Plan Note (Signed)
HEALTHCARE                            CARDIOLOGY OFFICE NOTE   NAME:[name], [name]                        MRN:          [id number]  DATE:10/01/2007                            DOB:          1971-03-24    REASON FOR PRESENTATION:  Evaluate patient with chest pain.   HISTORY OF PRESENT ILLNESS:  The patient is a 40 year old white female  with past history of chest discomfort and a hospitalization at [facility] a  few years ago.  At that time, she had negative CT for pulmonary emboli  but it was felt not to be anginal discomfort.  She did not have any  further cardiovascular testing.   She says she gets chest discomfort constantly.  This happens every day  all the time.  The only time that it might go away is if she lies in bed  perfectly still.  However, if she moves her arms or takes a deep breath  or does any walking accompanied by deep breathing, she will get chest  discomfort.  It can be 10/10 in intensity.  She describes it as a  pressure.  She might get sweaty or short of breath with this.  She does  not describe radiation to her jaw or to her arms.  Again, it has been a  constant pattern.  She does not describe any PND or orthopnea.  She not  to have any presyncope or syncope, though she will occasionally get  palpitations.  She does smoke cigarettes.  She wheezes.  She reports  asthma.   The patient has home O2.  She wears this as needed at night if she gets  particularly short of breath.   PAST MEDICAL HISTORY:  1. Asthma.  2. COPD.  3. Diabetes mellitus times 20 years (poorly controlled)  4 . Hyperlipidemia.  1. Depression/anxiety.   PAST SURGICAL HISTORY:  Uterine surgery for endometriosis.   ALLERGIES:  PENICILLIN.   MEDICATIONS:  1. Albuterol.  2. Glipizide 10 mg b.i.d.  3. NovoLog.  4. Spiriva.  5. Advair 500/50 b.i.d.  6. Fluoxetine.  7. Chantix (the patient is not taking this because she can not afford      it).  8. Metformin  500 mg b.i.d.  9. Lipitor 40 mg daily.   SOCIAL HISTORY:  The patient is a housewife.  She is married.  She has  two stepchildren.  She smoked one pack per day for 20 years.  She does  not drink alcohol.  Family history is contributory for her father having  died of complications of diabetes in his 17s.   REVIEW OF SYSTEMS:  As stated in the HPI, positive for dizziness, joint  pains, mild lower extremity swelling.  Negative for other systems.   PHYSICAL EXAMINATION:  GENERAL:  The patient is in no distress.  VITAL SIGNS:  Blood pressure 130/77, heart rate 109 and regular, weight  231 pounds.  HEENT:  Eyes are unremarkable, pupils equal, round,  reactive, fundi not visualized, oral mucosa remarkable.  NECK:  No jugular distention at 45 degrees carotid upstroke brisk and  symmetrical.  No bruits, thyromegaly.  Lymphatics no cervical, axillary,  inguinal.  LUNGS:  Diffuse expiratory wheezing, no crackles.  BACK:  No costovertebral angle tenderness.  CHEST:  Unremarkable.  HEART:  PMI not displaced or sustained, S1-S2 within normal.  No S3-S4,  no clicks, rubs, murmurs.  ABDOMEN:  Obese, positive bowel sounds.  Normal in frequency and pitch,  no bruits, rebound, guarding or midline pulse.  No mass.  No  hepatosplenomegaly  SKIN:  No rashes, no nodules.  EXTREMITIES:  Pulses 2+ throughout, no edema, cyanosis or clubbing.  NEUROLOGICAL:  Oriented to place, time, cranial nerves II-XII grossly  intact, motor grossly intact throughout.   EKG sinus rhythm, rate 91, axis within normal.  Intervals within normal  limits, no acute ST wave change.   ASSESSMENT/PLAN:  1. Chest.  The patient's chest discomfort is atypical.  However, she      has significant cardiovascular risk factors and longstanding      diabetes.  She needs to be screened with a stress perfusion study      because the pretest probability of obstructive coronary disease is      at least moderate.  I do not think that the  patient should get      adenosine because of her wheezing.  She does think she might be      able to walk on the treadmill to achieve her target heart rate and      we will try this.  2. Tobacco.  We discussed the need to stop smoking (greater than 3      minutes).  She cannot afford the Chantix, she is going to try cold      [place].  3. Asthma/COPD per her primary physician.  4. Diabetes.  She reports that this is not poorly controlled and we      reviewed this.  She will continue with medications as listed and      close follow-up with primary care doctors.  5. Dyslipidemia.  I think the goal for this patient with her LDL would      be less than 70 with an HDL in the 50.  I will defer to her primary      care doctor unless suggested otherwise.  6. Follow-up will be based on results of the above testing.  If she      has a negative stress      perfusion study.  I would not suggest further cardiovascular      testing as this would most likely be a musculoskeletal etiology.     Rollene Rotunda, MD, [facility]  Electronically Signed    JH/MedQ  DD: 10/01/2007  DT: 10/01/2007  Job #: [id number]   cc:   [name], MD

## 2010-11-23 NOTE — H&P (Signed)
NAMEHAZELYN, [name]               ACCOUNT NO.:  [id number]   MEDICAL RECORD NO.:  [id number]          PATIENT TYPE:  INP   LOCATION:  5151                         FACILITY:  [facility]   PHYSICIAN:  Santiago Bumpers. Hensel, M.D.DATE OF BIRTH:  September 23, 1970   DATE OF ADMISSION:  11/21/2007  DATE OF DISCHARGE:                              HISTORY & PHYSICAL   PRIMARY CARE Myranda Pavone:  [name], MD, [name].   CHIEF COMPLAINT:  Abdominal pain.   HISTORY OF PRESENTING ILLNESS:  The patient is a 40 year old female with  a history of type 2 diabetes, COPD, asthma, DVT, and endometriosis who  presents to the ED with a 6-day history of right-sided abdominal pain,  nausea, and vomiting.  Pain began suddenly on Thursday, which was 6 days  ago.  It is described as right-sided extending from the umbilicus to the  right flank.  This is in the upper and lower quadrant.  It is constant  but its severity fluctuates from mild which is 5/10 to severe which is  10/10.  The pain was accompanied by nausea and bilious vomiting but no  diarrhea and the pain did not remit by Sunday.  The patient came to the  emergency room.  In the ER on Sunday, an abdominal ultrasound showed  fatty liver and common bile duct with the upper limits of normal but was  otherwise negative.  The patient's LFTs and lipase were both normal at  that time.  She was sent home until the followup with [facility].  However, [facility] do not have an appointment available until June.  So, the patient came to the [facility] where she saw Dr. Mannie Stabile  and Dr. Mannie Stabile felt that the pain was likely radicular in nature and  perhaps zoster.  He prescribed Mobic, Flexeril, and MiraLax as the  patient endorses constipation, which she did not improve.  She came back  to the [facility] today, Nov 21, 2007, and saw Dr. Mannie Stabile again  who changed her pain medicines to Vicodin and ordered a T-spine and L-  spine films.   Those films showed anterior paravertebral spurring.  The  patient reports that after that she continued to have nausea and  vomiting and states that she can keep solids down but can keep some  liquids down.   Review systems is positive for blood on stool one month ago;  constipation x1 week; pleuritic vaginal rash; white vaginal discharge;  headaches for the past few days; dry mouth; elevated blood sugars in the  300-400; wheezing; decreased sensation in the left leg, although she has  this at baseline presumably due to diabetic neuropathy.  Review of  systems was negative for chest pain, shortness of breath, dysuria, upper  airway symptoms, changes in her menstrual periods.   LABS AND STUDIES:  Sodium was 130, corrected for blood sugar that was  133; potassium 3.6; chloride 92; bicarb 29; BUN 10; and creatinine 1.07  Giving her GFR 57, glucose 312, T-bili 0.5, total protein 8.0, albumin  2.7, AST 13, ALT 17, lipase 18,  and calcium 9.4.  CBC showed a white  blood cell count of 14.5, hemoglobin 12.4, and platelets 399.  Differential showed an ANC of 11.3 and 78% neutrophils, otherwise  normal.  UA showed specific gravity of 1.046 over 1000 glucose, 15  ketones, 100 protein, negative leukocyte esterase, and negative nitrite.  Micro showed many epithelial cells, 7-10 white blood cells, many  bacteria, and other amorphous urates.  Lipase was normal at 18.  Ultrasound from Nov 18, 2007, showed common bile duct at 5 mm, which was  the upper limits of normal and the fatty liver.  T-spine as mentioned  previously showed anterior paravertebral spurring without fracture or  subluxation.  L-spine showed no acute bony findings.  CT abdomen done in  the emergency room today showed a right pyelonephritis in the mid-to-  lower pole with overlying perinephric fluid or developing abscess.   The patient's allergies include PENICILLIN and SINGULAIR.   PAST MEDICAL HISTORY:  Includes:  1. Type 2  diabetes.  2. History of DVT.  3. Asthma.  4. Tobacco abuse.  5. Chronic obstructive pulmonary disease.  6. A suspected UTI in April.   Of note, there is a urine culture in E-chart from Nov 16, 2007, that  showed over 100,000 colonies of mixed species.  Recommendation was for  recollection as indicated.  There were no sensitivities given.   PAST SURGICAL HISTORY:  Includes a D&C.   FAMILY HISTORY:  Includes:  1. Diabetes.  2. Coronary artery disease.  3. DVTs.   SOCIAL HISTORY:  The patient lives with husband, has two son.  She  smokes about three cigarettes a day currently.  She currently reports  that she is unable work due to limited ability to walk, presumably from  her neuropathy.  She is trying to get disability.  She does not drink  alcohol.  She does not use illicit drugs.   PHYSICAL EXAM:  VITAL SIGNS: Temperature 98.7 degrees, pulse 107,  respirations 16, blood pressure 103/66, and oxygen saturation 95% on  room air.  GENERAL: Well-developed and well-nourished patient, uncomfortable but in  no acute distress.  She is alert, appropriate, and cooperative  throughout the examination.  LUNG EXAM: Normal respiratory effort.  She does have expiratory wheezes  throughout the lung fields but no crackles or rales.  HEART: Normal rate  and rhythm.  No murmurs, rubs, or gallops detected.  ABDOMEN: Soft, extremely tender to palpation in right upper and lower  quadrant.  She is less tender at the right flank but does endorse some  tenderness there.  She has some voluntary guarding but no rebound  tenderness.  No Murphy sign.  Do not detect any organomegaly or ascites  and does have normal bowel sounds.  GENITALIA: Because she endorse some vaginal discharge, a brief genital  exam which showed some labial erythema, otherwise normal.  PSYCHIATRIC: Cognition and judgment appear intact.  She is alert,  cooperative with normal attention and concentration.   ASSESSMENT AND PLAN:  The  patient is a 40 year old female with diabetes,  chronic obstructive pulmonary disease, tobacco dependence, found to have  pyelonephritis with possible developing perinephric abscess.  1. Pyelonephritis.  We will get blood cultures x2 and a cath urine      culture, then we will start antibiotics and we will start her on      Cipro and Flagyl.  This will cover the normal urinary tract  bugs      and also the Flagyl  cover for anaerobes, discusses with pharmacy      before starting this regimen.  We use Dilaudid for pain and      hopefully can decrease pain meds and antibiotics start to take      effect.  CT was read as perinephric fluid versus developing      absence.  There is unlikely anything to drain at this point but      will need to consider that if she does not improve on antibiotics.      We will give a normal saline bolus and then IV fluids at 150 mL an      hour and give Zofran for nausea.  Of note initially, we intended to      start Zosyn but after discussion with the pharmacy the patient does      have a penicillin allergy, so our first two choices of Zosyn  or      Augmentin would not be good choices because of her penicillin      allergy.  2. Chronic obstructive pulmonary disease.  The patient did have      expiratory wheezes on exam, ordered q.4 scheduled and q.2 h. p.r.n.      nebs to be spaced by RRT, get a chest x-ray in the morning at some      point, may want to consider Spiriva.  I have also continued on her      Advair daily.  3. Diabetes, uncontrolled.  The patient's last A1c in February was      11.1.  CBGs have been 300-400 this past week.  The patient insists      that she is taking her medication.  She is currently on her home      meds right now or NovoLog 70/30 thirty units prior to breakfast and      40 units prior to dinner.  Again,  I spoke with pharmacy about this      issue and decided that given her dosing and her A1c and current      sugars, they  started her on Lantus 25 units b.i.d. and resistant      sliding-scale insulin with h.s. coverage, checking CBGs every 4      hours.  We will get an A1c with a.m. labs.  We will continue      aspirin and hold metformin for now since her creatinine is slightly      elevated and consider restarting that once her creatinine gets      right down to her baseline closer to 0.7.  4. Hypertriglyceridemia.  We will continue her simvastatin.  5. Tobacco dependent.  Currently has reduced her cigarette smoking to      three cigarettes per day and cancel liquid smoking.  6. Hyponatremia.  The patient's corrected sodium is actually 133, so,      it is not particularly low.  It is probably due to dehydration.  We      will consider checking a FENa if her sodium remains low.  7. Anxiety state, unspecified.  We will continue her Prozac at her      home dose.      Asher Muir, MD  Electronically Signed      Santiago Bumpers. Leveda Anna, M.D.  Electronically Signed    SO/MEDQ  D:  11/22/2007  T:  11/22/2007  Job:  [id number]

## 2010-11-23 NOTE — Consult Note (Signed)
NAMEAIVA, [name]               ACCOUNT NO.:  [id number]   MEDICAL RECORD NO.:  [id number]          PATIENT TYPE:  EMS   LOCATION:  MAJO                         FACILITY:  [facility]   PHYSICIAN:  Noel Christmas, MD    DATE OF BIRTH:  1970/10/11   DATE OF CONSULTATION:  10/18/2008  DATE OF DISCHARGE:  10/19/2008                                 CONSULTATION   REFERRING PHYSICIAN:  Chriss Driver, MD   REASON FOR CONSULTATION:  Code stroke presentation with complaint of  slurred speech and left side numbness.   HISTORY OF PRESENT ILLNESS:  This is a 40 year old lady who was brought  to the emergency room with code stroke status by EMT with complaint of  slurred speech with the onset at 10:45 p.m.  Code stroke was called at  11:17 p.m.  CT scan was completed at 11:37 p.m. and initial neurological  evaluation began at 11:42 p.m.  The patient experienced sudden onset of  slurring of speech.  She also complained of burning sensation involving  left scalp and sensory abnormalities involving left arm and left leg.  She did not experience of the left extremities nor the facial weakness.  No visual changes occurred as well.  There is no previous history of  stroke or TIA.  She has been on aspirin 81 mg per day.  CT scan showed  no acute intracranial abnormality.   PAST MEDICAL HISTORY:  Remarkable for:  1. Insulin-dependent diabetes mellitus.  2. Hypertension.  3. COPD/asthma.  4. Depression and anxiety.  5. Chronic lower extremity edema as well as obesity.   CURRENT MEDICATIONS:  1. Albuterol inhaler 2 puffs every 4 hours.  2. NovoLog mix 70/30, 50 units in the morning and 55 units in the      evening.  3. Advair Diskus 1200/50 one puff twice a day.  4. Fluoxetine 40 mg per day.  5. Simvastatin 40 mg per day.  6. Aspirin 81 mg day.  7. Promethazine 12.5 mg 1-2 tablets every 6-8 hours p.r.n. nausea.  8. MiraLax powder 17 g dissolved in water twice a day for 2-3 days as      needed.  9.  Compression stockings as needed for lower extremity swelling.  10.Lasix 20 mg per day for lower extremity swelling.  11.Ultram 50 mg q.6 h. p.r.n. pain.  12.Midodrine 5 mg 3 times a day.   FAMILY HISTORY:  Positive for stroke involving her father.  Both of her  parents have diabetes mellitus and her mother has peripheral vascular  disease and bilateral lower extremity amputations.   PHYSICAL EXAMINATION:  GENERAL:  Appearance is that of an obese, early  middle-aged lady who was alert and cooperative and in no acute distress.  She was well oriented to time as well as place.  She had no receptive  nor expressive aphasia.  She had non-physiological pattern of slurred  hesitant speech.  Dysarthria was embellished.  HEENT:  Pupils were equal and reactive normally to light.  Extraocular  movements were full and conjugate.  Visual fields were intact and  normal.  There was  no facial weakness.  Hearing was normal.  Palate  movement was normal and symmetrical.  EXTREMITIES:  She had non-physiological left pronator drift of the upper  extremity as well as poor effort with strength testing of left lower  extremity.  Strength of right extremity was normal.  Deep tendon  reflexes were normal and symmetrical.  Plantar responses were flexor.  Sensory testing was unreliable.  She had no carotid bruit on either  side.   CLINICAL IMPRESSION:  1. No objective signs of focal nor diffuse central nervous system      deficit.  2. Suspect psychophysiologic basis for the patient's presenting      complaints.   RECOMMENDATIONS:  1. Further neurological intervention as indicated.  2. Reassurance for the patient that she is not showing any objective      signs of acute stroke or TIA.  3. Continue aspirin 81 mg per day.   Thank you for asking me to evaluate [name].      Noel Christmas, MD  Electronically Signed     CS/MEDQ  D:  10/19/2008  T:  10/20/2008  Job:  [id number]

## 2010-11-23 NOTE — H&P (Signed)
[name], [name]               ACCOUNT NO.:  [id number]   MEDICAL RECORD NO.:  [id number]          PATIENT TYPE:  INP   LOCATION:  3736                         FACILITY:  [facility]   PHYSICIAN:  Verne Carrow, MDDATE OF BIRTH:  Jun 18, 1971   DATE OF ADMISSION:  07/25/2008  DATE OF DISCHARGE:                              HISTORY & PHYSICAL   PRIMARY CARDIOLOGIST:  Duke Salvia, MD, [facility]   PRIMARY MEDICAL DOCTOR:  [name], MD   CHIEF COMPLAINT:  Syncope.   HISTORY OF PRESENT ILLNESS:  Several-month history of very brief (1-  minute) episodes of syncopal episodes, which have been increasing in  frequency.  Today, she has had 6 episodes.  The patient denies aura  except for 1 time today, no witnessed involuntary movement, tongue  biting, however, loss of bladder intermittently during the unconscious  episodes.  The patient had approximately 3-5 minutes of slight confusion  after events, shortness of breath, which resolved quickly with use of  her asthma inhalers, and substernal chest pain/pressure that resolved  after approximately 30 minutes of rest following these events.   PAST MEDICAL HISTORY:  1. COPD/asthma.  2. Diabetes mellitus, insulin-dependent and poorly controlled.  3. Tobacco abuse disorder.  4. Dyslipidemia.   SOCIAL HISTORY:  The patient lives in [place] with her husband and 2  children.  She is currently unemployed and trying to get disability.  She has a 50-pack-year tobacco abuse history and currently smokes  approximately half pack per day.  She does not drink any alcohol nor  does she use any illicit drugs.  She takes no herbal medications.  Her  diet is modified only by trying to limit her glucose intake.  She does  no regular exercise.   FAMILY HISTORY:  Mother is living, aged 28; positive for CAD status post  CABG x3.  Her father deceased at age 61.  He also had CAD, diabetes  mellitus, and a history of syncopal episodes that are  similar to the  ones that she has been experiencing.  She has 2 brothers, one has a  hypercoagulable state and must be on Coumadin secondary to DVT  prophylaxis and her sister has COPD.   REVIEW OF SYSTEMS:  As described in the HPI with the addition of  intermittent wheezing.  All other systems reviewed and were negative.   ALLERGIES:  The patient has allergies to:  1. PENICILLIN.  2. SINGULAIR.   MEDICATIONS:  1. Albuterol MDI p.r.n. for shortness of breath/wheezing.  2. NovoLog 70/30 insulin, 50 units subcu b.i.d.  3. Advair 500/50 p.o. b.i.d.  4. Fluoxetine 20 mg p.o. daily.  5. Simvastatin 40 mg p.o. at bedtime.  6. Aspirin 81 mg p.o. daily.  7. Albuterol nebulizer p.r.n. for shortness of breath more severe than      usual.  8. In the emergency department, she was given 1 aspirin 325 mg by      mouth and morphine 4 mg IV push.   PHYSICAL EXAMINATION:  VITAL SIGNS:  Temp is 97.8 degrees Fahrenheit, BP  128/81, pulse 110, respiration rate 17, her  O2 saturation 96% on room  air.  GENERAL:  She was alert and oriented x3 in no apparent distress and  spoke to me easily without respiratory difficulty during the exam nor  were any syncopal episodes witnessed during the exam.  HEENT:  Head was normocephalic and atraumatic.  Pupils are equal, round,  and reactive to light.  Extraocular muscles were intact.  Nares were  patent without discharge.  Dentition without any obvious abnormality.  Oropharynx without erythema or exudate.  NECK:  Supple without lymphadenopathy.  No bruits and no JVD.  No  thyromegaly.  HEART:  Regular.  Audible S1, S2.  No murmurs, clicks, rubs, or gallops.  Pulses were 2+ and equal bilaterally in the upper extremities, 1+ and  equal bilaterally in the lower extremities.  LUNGS:  Clear except there was a mild wheezing in the right upper lobe.  Good air movement.  SKIN:  No rashes, lesions, or petechiae.  ABDOMEN:  Soft and nontender.  Normal abdominal bowel  sounds.  No  hepatosplenomegaly.  No pulsations.  The patient is obese.  EXTREMITIES:  No clubbing, cyanosis, or edema.  MUSCULOSKELETAL:  No joint deformity, effusions.  No spinal or CVA  tenderness.  No evidence of trauma.  NEURO:  Cranial nerves II through XII were grossly intact.  Strength was  5/5 in all extremities and axial groups, normal sensation throughout.  Normal cerebellar function.   DIAGNOSTIC DATA:  Chest x-ray showed no acute infiltrates or edema,  stable right basilar atelectasis.  CT of the head, no acute intracranial  findings; however, there was a small metallic object consistent with a  BB visible in the right temporal region exterior to the skull.   ELECTROCARDIOGRAM:  EKG showed a normal sinus rhythm at a rate of 91, no  acute ST-T wave changes, no Q-wave, axis was normal, no evidence of  hypertrophy.  No significant difference from EKG performed on July 14, 2008.  PR 124, QRS 86, QTc 448.   LABORATORY DATA:  WBC 14.2, HGB 15.4, HCT 46.0, and PLT count 277.  Sodium 133, potassium 4.1, chloride 99, CO2 of 26, BUN 11, creatinine  0.8, and glucose 354.  First set of cardiac enzymes were negative.  CK-  MB 1.0 and troponin I less than 0.05.   ASSESSMENT:  This is a 40 year old white female with history of diabetes  mellitus, hyperlipidemia, chronic obstructive pulmonary disease/asthma  with the recent complaints of passing out multiple times daily over  the last few months.  The patient describes being in her normal state of  health when she blacks out.  It lasts several minutes, no awareness  that she may pass out.  Confused for 3-5 minutes afterwards but no  postictal state.  No witnessed seizure activity.  Some chest pain after  events.  The patient was seen by Dr. Graciela Husbands last week and had a 48-hour  Holter monitor placed yesterday.  Stress Myoview on Friday, July 18, 2008, with reported lack of ischemia (unable to view report at this  time).   Transthoracic 2-D echocardiogram completed December 14, 2007, showed  normal LV function with EF of 55%-60%, no evidence of regional wall  motion abnormalities, no obvious large valvular vegetations.  Differential:  Neurally-mediated syncope versus arrhythmia versus  orthostasis versus anxiety/psych versus seizure activity.  Currently,  tachycardic but normotensive.  EKG with normal sinus rhythm.   PLAN:  1. Admit to telemetry.  We will check orthostatics.  If she has  an      event, we will have Nursing document vitals,      appearance of the patient, and rhythm.  2. Rule out MI with serial cardiac enzymes.  EKG in the a.m.  Holter      monitor can be interrogated to see if there were any arrhythmias      today.  Obtain report on Myoview.  May need tilt testing.  We will      have EP evaluate.      Jarrett Ables, [facility]      Verne Carrow, MD  Electronically Signed    MS/MEDQ  D:  07/25/2008  T:  07/26/2008  Job:  [id number]

## 2010-11-23 NOTE — H&P (Signed)
NAMEBREELEY, BISCHOF               ACCOUNT NO.:  [id number]   MEDICAL RECORD NO.:  [id number]          PATIENT TYPE:  INP   LOCATION:  5040                         FACILITY:  [facility]   PHYSICIAN:  Leighton Roach McDiarmid, M.D.DATE OF BIRTH:  1971/01/16   DATE OF ADMISSION:  12/13/2007  DATE OF DISCHARGE:                              HISTORY & PHYSICAL   CHIEF COMPLAINT:  Flank pain.   HISTORY OF PRESENT ILLNESS:  This is a 40 year old female who was  discharged from [name] on Nov 20, 2007, with perinephric abscess  that was drained by interventional radiology.  She returns today with  worsening pain of her right flank.  She was seen by her PCP last week  but her exam was relatively normal.  Since then, her pain has worsened  to the point of being too painful to breathe deeply.  Pain is worse in  her lower back, over her right-sided flank, and right-sided abdomen.  She denies fevers but did complain of chills last night.  She complains  of nausea but no vomiting.  She denies dysuria but complains of a  burning sensation on the outside of her vagina at times.  She did  complete a full course of p.o. antibiotic, Cipro.  She refuses further  interventional radiology drainage unless she is under full sedation.   PAST MEDICAL HISTORY:  Diabetes type 2, history of DVT, asthma, tobacco  abuse, COPD, perinephric abscess May 2009, and UTI E. coli in April  2009.   MEDICATIONS:  1. Albuterol MDI.  2. Glipizide 10 mg p.o. b.i.d.  3. NovoLog 70/30, 40 units subcu q.p.m. and 40 units subcu q.a.m.  4. Advair 500/50 one puff p.o. b.i.d.  5. Fluoxetine 10 mg p.o. daily.  6. Metformin 1000 mg p.o. daily.  7. Simvastatin 40 mg p.o. nightly.  8. Aspirin 81 mg daily.  9. Meloxicam 15 mg daily.  10.Phenergan p.r.n.  11.MiraLax b.i.d. p.r.n.   ALLERGIES:  PENICILLIN which causes a rash, and SINGULAIR which causes  angioedema.   FAMILY HISTORY:  Diabetes, coronary artery disease, and family  members  with DVT.   SOCIAL HISTORY:  Lives with her husband and 2 sons.  Smokes pack of  cigarettes daily.  She is unable to work due to her neuropathy, trying  to get disability.  Denies alcohol.  Denies drugs.   REVIEW OF SYSTEMS:  As in HPI with the following additions.  She  complains of abdominal pain.  Denies anorexia, fever, weight loss,  vision loss, chest pain, syncope, dyspnea on exertion, prolonged cough,  headaches, melena, or hematochezia.  Denies chest pain. Denies dysuria  or other complaints of vaginal pain.  She complains of pleuritic chest  pain.   PHYSICAL EXAMINATION:  VITAL SIGNS:  Oxygen 92% on room air, temperature  98.4, pulse is 100, blood pressure is 114/69, repeat 133/83, repeat  pulse 107, repeat oxygen 95% on room air.  GENERAL:  She is overweight not in acute distress.  HEENT:  Head is normocephalic and atraumatic.  Eyes:  Pupils are equal,  round, reactive to light and accommodation.  Extraocular muscles intact.  Vision intact.  Nose:  No deformities or lesions.  Mouth:  Moist mucous  membranes.  No exudate.  NECK:  No JVD.  No lymphadenopathies.  LUNGS:  Diffuse wheezes, faint crackles in the right lower lobe.  No  increased work of breathing.  There is pain on palpation over the right  ribcage.  HEART:  Regular rate and rhythm.  No rubs, gallops, or murmurs.  ABDOMEN:  Soft and tender to palpation on the right quadrant, over her  side of flank.  Normal bowel sounds.  No rebound or guarding.  MUSCULOSKELETAL:  Normal range of movement.  No joint tenderness.  No  joint swelling.  No joint warmth.  BACK:  Her back has CVA tenderness to palpation on the right.  Right  flank is tender to palpation with questionable bulging area of skin in  this area.  The left side is within normal limits.  Pulses are 2+  dorsalis pedis pulses and radial bilaterally.  EXTREMITIES:  No edema.  NEUROLOGIC:  No cranial deficits.  Refluxes are symmetrical bilaterally.   SKIN:  No erythema or warmth at drainage site.  Skin turgor is normal.  Color is normal.  No rash.  PSYCH:  Alert and cooperative.  No delusions.   LABORATORY DATA:  Urinalysis shows rare bacteria, greater than 1000  glucose, negative nitrites, negative esterase, 15 ketones, specific  gravity 1.045.  Sodium is 130, potassium 4.5, glucose 359, bicarb 27,  creatinine 0.7, BUN 8, AST 14, ALT 19, total bilirubin 0.6, and alk phos  115.  White blood cells elevated at 14.9, hemoglobin 4.6, hematocrit  15.4, platelets 478; neutrophils 84%.  ANC 12.5.  Of note, on Dec 07, 2007, ANC was 8.3, 66% neutrophils, and white count 12.6.  CAT scan of  the abdomen and pelvis shows a small 2.4-cm recurrent right perinephric  abscess and adjacent focal pyelonephritis with surrounded  retroperitoneal inflammation.  Right flank musculature and subcutaneous  edema of soft tissue suspicion for development of cellulitis.  No renal  obstruction.   ASSESSMENT:  A 40 year old female with:  1. Perinephric abscess, recurrent, but smaller.  We will start empiric      antibiotics with vancomycin and Cipro as the patient has PENICILLIN      allergy.  We will call urology as Dr. Brunilda Payor has seen the patient      before.  If possible, we will call interventional radiology      depending on urology consult result. We will treat the patient for      pain management and followup with creatinine.  At this point, we      will keep her n.p.o. after midnight in case of the procedure.      However, we may defer this later on the admission.  Last abscess      culture was MSSA.  We will get a morning CBC and BMET.  2. Pyelonephritis:  This is based on CT findings but UA does not      correlate.  We will continue Cipro based on the wound abscess      culture note, treatment as needed.  3. Diabetes.  Continue her home meds until midnight and then put her      on sliding scale insulin and should be n.p.o. after midnight.  Hold       home meds in the morning.  Metformin was held for possible CAT scan      with IV  in the morning.  We will resume home meds once on diet.  4. Chronic obstructive pulmonary disease.  Switch albuterol nebs if      the patient is unable to take deep inspiration due to her pain in      her flank.  5. Constipation.  Continue home regimen and MiraLax.  6. History of deep vein thrombosis.  We will hold off      anticoagulation/procedure, not on Coumadin currently.      We can place her SCDs as needed.  7. Hypertriglyceridemia.  Continue simvastatin.  8. Anxiety.  Continue fluoxetine.      Johney Maine, M.D.  Electronically Signed      Leighton Roach McDiarmid, M.D.  Electronically Signed    JT/MEDQ  D:  12/14/2007  T:  12/15/2007  Job:  [id number]

## 2010-11-23 NOTE — Discharge Summary (Signed)
NAMEADELIE, CROSWELL               ACCOUNT NO.:  [id number]   MEDICAL RECORD NO.:  [id number]          PATIENT TYPE:  INP   LOCATION:  3736                         FACILITY:  [facility]   PHYSICIAN:  Duke Salvia, MD, FACCDATE OF BIRTH:  1971/03/20   DATE OF ADMISSION:  07/25/2008  DATE OF DISCHARGE:  07/28/2008                               DISCHARGE SUMMARY   PRIMARY CARDIOLOGIST:  Duke Salvia, MD, [facility]   PRIMARY CARE Dreydon Cardenas:  [name], MD   DISCHARGE DIAGNOSIS:  Syncope.   SECONDARY DIAGNOSES:  1. Chronic obstructive pulmonary disease/asthma.  2. Insulin-dependent diabetes mellitus.  3. Ongoing tobacco abuse.  4. Hyperlipidemia.   ALLERGIES:  PENICILLIN and SINGULAIR.   PROCEDURES:  Tilt table testing showing orthostatic tachycardia without  syncope.   HISTORY OF PRESENT ILLNESS:  A 40 year old female with prior history of  syncope previously seen by Dr. Sherryl Manges.  After following at her  last visit, she was placed on a 40-hour Holter monitor.  She  subsequently presented to the [facility] ED on July 25, 2008,  following six episodes of syncope reportedly lasting 1 minute each  followed by 3-5 minutes of confusion and dyspnea, improved with  inhalers.  In the emergency department, ECG showed no acute changes and  cardiac markers were normal.  Decision was made to admit her for further  evaluation.   HOSPITAL COURSE:  Further review of records revealed a negative Myoview  performed in our office, performed on Thursday, July 24, 2008.  We  were also able to obtain data from her Holter monitor, which showed no  arrhythmia despite her syncopal episode.  Over the weekend, she had no  recurrent syncope and this morning she was seen by Dr. Sherryl Manges with  our electrophysiology service.  Decision was made to pursue tilt table  testing.  Tilt table testing was performed and did reveal orthostatic  tachycardia without syncope.  Ms. Nery will be  discharged home today.   DISCHARGE LABORATORY DATA:  Hemoglobin 16.7, hematocrit 49.0, WBC 14.2,  platelets 277.  Sodium 132, potassium 4.0, chloride 95, CO2 of 26, BUN  9, creatinine 0.6, glucose of 297, total bilirubin 0.9, alkaline  phosphatase 103.  AST 24, ALT 29, protein 7.8, albumin 3.9, calcium 9.4,  magnesium 2.4.  Cardiac markers negative x3.  Total cholesterol 172,  triglycerides 348, HDL 24, LDL 78.  TSH 1.596.  Urine hCG was negative.   DISPOSITION:  The patient will be discharged home today in good  condition.   FOLLOWUP PLANS AND APPOINTMENTS:  We have asked her to follow up with  her primary care Nihaal Friesen in the next 1-2 weeks.   DISCHARGE MEDICATIONS:  1. Prozac 20 mg daily.  2. Zocor 40 mg nightly.  3. Aspirin 81 mg daily.  4. Advair 500/50 one puff b.i.d.  5. Albuterol MDI one puff p.r.n.  6. NovoLog 70/30, 50 units subcu b.i.d.   OUTSTANDING LABORATORY STUDIES:  None.   DURATION OF DISCHARGE/ENCOUNTER:  40 minutes including physician time.      Nicolasa Ducking, ANP  Duke Salvia, MD, [facility]  Electronically Signed    CB/MEDQ  D:  07/28/2008  T:  07/29/2008  Job:  [id number]   cc:   [name], MD

## 2010-11-23 NOTE — Discharge Summary (Signed)
[name], [name]               ACCOUNT NO.:  [id number]   MEDICAL RECORD NO.:  [id number]          PATIENT TYPE:  OBV   LOCATION:  3707                         FACILITY:  [facility]   PHYSICIAN:  Nestor Ramp, MD        DATE OF BIRTH:  [birth year]   DATE OF ADMISSION:  09/01/2007  DATE OF DISCHARGE:  09/02/2007                               DISCHARGE SUMMARY   DISCHARGE DIAGNOSES:  1. Chest pain secondary to anxiety versus costochondritis, noncardiac.  2. Chronic obstructive pulmonary disease.  3. Diabetes type 2.  4. Hypertriglyceridemia.  5. Anxiety.  6. History of tobacco dependence.  7. Obesity.   DISCHARGE MEDICATIONS:  1. Albuterol 90 mcg MDI 1-2 puffs q.4 h. p.r.n. for wheezing.  2. Glipizide 10 mg p.o. b.i.d.  3. NovoLog 70/30 40 units at dinner; 25 units at breakfast.  4. Spiriva 1 inhalation once in the morning daily.  5. Advair 500/50 mcg one puff b.i.d.  6. Fluoxetine 10 mg p.o. daily.  7. Metformin 500 mg p.o. once a day.  8. Fenofibrate 160 mg p.o. once daily.  9. Aspirin 81 mg p.o. daily.  10.Ibuprofen 800 mg p.o. q.8 h., scheduled x5 days.   CONSULTATIONS:  None.   PROCEDURES:  None.   LABORATORY DATA AND X-RAY FINDINGS:  White blood cell count 14.3,  hemoglobin 14.8, platelets 320.  Sodium 131, potassium 4.2, bicarb 27.6,  creatinine 0.9, glucose 449.  Point of care enzymes are negative.  During hospitalization, she had a negative D-dimer at 0.25.  Cardiac  enzymes were negative presumably x2.  AST 19, ALT 34, Alk phos 85, total  bilirubin 0.7, calcium 9.3, albumin 3.9.  Cholesterol 217, triglycerides  462 and HDL of 32.  White blood cell count was 10.9 with hemoglobin 13.7  on discharge.   EKG showed normal sinus rhythm.  Chest x-ray showed chronic bronchitic  changes with no acute abnormalities.  The patient had a repeat EKG that  was negative in normal sinus rhythm.  Hemoglobin A1c was pending.  TSH  was also pending.   HOSPITAL COURSE:  This is a  40 year old female patient of mine that was  admitted for chest pain.  The patient was admitted through the ED after  contacting the on-call physician complaining of chest pain that was  brought on with exertion and has been going on for several days,  previously relieved with rest, but all of a sudden did not improve with  rest.  Yesterday afternoon while walking, she stated that it felt like a  pressure and tightness like someone had punched her in the chest.  It  lasted for greater than 4 hours associated with some shortness of  breath.  She came into the ED.  She was given nitroglycerin and stated  that she had some mild relief, but continued to have pain.  She had  initial EKG that showed normal sinus rhythm.  She had point of care  enzymes that were negative.  Chest x-ray just showed bronchitic changes  that were chronic with no acute abnormalities.  Her labs were within  normal limits.  She was admitted for rule out MI and also rule out PE.  She had a D-dimer that was a negative, so no further workup was done.  We cycled her cardiac enzymes which were negative x2 after 16 hours.  Repeat EKG showed normal sinus rhythm.  She was adequately ruled out.  It was felt that she could be worked further as an outpatient with  stress test that will be set up on her next appointment.  It is felt  that this chest pain may be more related to anxiety and costochondritis  given the fact that she had pain elicited with palpation and she had  pleuritic pain as well.  She is on anxiety medication with fluoxetine at  this point.  We may have to increase that medication, but nothing was  changed here.  As far as her costochondritis, she was started on  ibuprofen 800 mg and told to continue this 3 times a day for the next 5  days and we will re-evaluate her at her next appointment.  She was also  started on a baby aspirin.  It may be beneficial that she be started on  a statin at her next  appointment.   DIET:  Carbohydrate-modified diet.   ACTIVITY:  No restrictions.   SPECIAL INSTRUCTIONS:  She was started on a new aspirin.  Again, may  look to start her on a statin.  This was not done previously in the past  because she has been complaining of muscle aches and pains.  Also, for  anxiety, may increase her fluoxetine.  She also had some labs pending  that need to be followed up, a TSH and hemoglobin A1c.  Stress test  should be set up for her as an outpatient.   FOLLOW UP:  Follow up with Dr. Burnadette Pop at [facility]  on September 13, 2007, at 2:10 p.m.   CONDITION ON DISCHARGE:  Stable.      [name], MD  Electronically Signed      Nestor Ramp, MD  Electronically Signed    KL/MEDQ  D:  09/02/2007  T:  09/03/2007  Job:  [id number]

## 2010-11-23 NOTE — Discharge Summary (Signed)
[name], [name]               ACCOUNT NO.:  [id number]   MEDICAL RECORD NO.:  [id number]          PATIENT TYPE:  INP   LOCATION:  5040                         FACILITY:  [facility]   PHYSICIAN:  Wayne A. Sheffield Slider, M.D.    DATE OF BIRTH:  1971/04/15   DATE OF ADMISSION:  12/13/2007  DATE OF DISCHARGE:  12/19/2007                               DISCHARGE SUMMARY   DISCHARGE DIAGNOSES:  1. Recurrent right perinephric abscess growing methicillin-sensitive      Staph aureus.  2. Questionable right pyelonephritis and right flank      cellulitis/myositis.  3. Type 2 diabetes.  4. Chronic obstructive pulmonary disease.  5. History of deep vein thrombosis.  6. Tobacco abuse.  7. Hyperlipidemia.   DISCHARGE MEDICATIONS:  1. Ciprofloxacin 400 mg one p.o. daily while drain is in, urology to      decide length of therapy.  2. Percocet 5/325 mg two q. 4-6 h. p.r.n. pain.  3. NovoLog 70/30, 44 units b.i.d.  4. Metformin 1000 mg nightly.  5. Glipizide 10 mg b.i.d.  6. Albuterol 90 mcg two puffs q.4-6 h. p.r.n. wheezing.  7. Fluoxetine 10 mg daily.  8. Simvastatin 40 mg daily.  9. Aspirin 81 mg daily.  10.Meloxicam 15 mg daily.  11.MiraLax p.r.n.   CONSULTANTS:  Interventional Radiology, Dr. Lowella Dandy, and the Urology, Dr.  Brunilda Payor.   PRIMARY CARE Jenevieve Kirschbaum:  [name], MD, [facility].   PROCEDURE:  JP drain placement on December 14, 2007.   IMAGING:  1. Chest x-ray showing minimal right basilar atelectasis without      evidence of consolidation.  2. Abdominal CT showing small 2.4-cm recurrent right perinephric      abscess and adjacent focal pyelonephritis and surrounding      retroperitoneal inflammation along with right flank musculature and      subcutaneous edema, soft tissue thickening suspicious for      developing cellulitis, but no evidence of hydronephrosis or renal      obstruction.   LABS ON ADMISSION:  White blood cell 40.9, hemoglobin 11.6, platelets  478,000,  neutrophils 84%, and lymphocytes 10%.  Sodium 130, potassium  4.5, chloride 93, bicarb 27, glucose 359, BUN 8, and creatinine 0.72.  LFTs within normal limits except for a low albumin of 2.5.  Lipase 16.  Initial urinalysis showing greater than 1000 glucose, 15 ketones, and  100 protein.  Negative nitrites.  Negative leukocyte esterases.  Microscopic showing few bacteria.  Urine culture with greater than  100,000 colonies of multiple bacterial morphotypes.   LABS ON DISCHARGE:  White blood cell 8.8, hemoglobin 9.0, and platelets  470,000.  Abscess culture growing methicillin-sensitive Staph aureus  resistant to erythromycin, but pansensitive otherwise.  Sodium 136,  potassium 4.2, chloride 100, bicarb 28, glucose 178, BUN 6, creatinine  0.73, and calcium 8.5.   HOSPITAL COURSE:  For full summary, please see dictated H&P.  In short,  this is a 40 year old female who presented a month ago to the hospital  with perinephric abscess and was treated with a course of Cipro and  drainage and presents  today with recurrent perinephric abscess and  imaging concerning for right pyelonephritis and advancement flank  cellulitis.  1. Recurrent perinephric abscess.  Interventional Radiology and      Urology were consulted who recommended to place a drain which was      done on December 14, 2007, and drain has drained about 10-20 mL per day      since then.  Given the patient had a PENICILLIN allergy, he was      initially treated with vancomycin and ciprofloxacin IV.  When      abscess culture returned growing MSSA, vancomycin was discontinued.      The patient transitioned to p.o. Cipro and tolerated well.  The      patient was afebrile throughout hospitalization and white blood      cell count steadily decreased to normal range.  Upon discharge, the      patient has had drain in place for 5 days and has been on 6 days of      IV antibiotics.  The patient to follow up with Urology on Monday      for  decision whether to continue to leave drain in or discontinue      drain and antibiotics.  Urology recommendations are to continue      antibiotics while drain is in place.  2. Questionable pyelonephritis.  The patient was treated with      vancomycin and ciprofloxacin initially, today is day 6 of Cipro and      the patient tolerating well, afebrile, and normal white blood      cells.  The patient to be sent home on p.o. Cipro.  Urine culture      obtained during hospitalization showing greater than 100,000      colonies of multiple bacterial morphotypes.  3. Type 2 diabetes.  Initially, antihyperglycemic medications were      held as the patient tolerated p.o. diet better.  Home regimen was      added back on.  The patient sent home on glipizide 10 mg b.i.d. and      Novolin 70/30, 44 units b.i.d. along with metformin.  The patient      had good improvement of CBGs once metformin was finally added to      regimen and sent home with blood sugars ranging from 100-200 on the      day of discharge.  4. COPD.  The patient continued on home regimen and tolerated well.  5. History of DVTs.  The patient treated with heparin subcu daily.  6. Hyperlipidemia.  The patient continued on Zocor home regimen.   FOLLOW UP:  1. The patient to follow up with Dr. Brunilda Payor, Urology, on Monday, December 24, 2007, at 1:45 p.m., phone number to [phone].  2. The patient to follow up with Dr. Burnadette Pop, PCP, on December 24, 2007, 4:10 p.m., phone number [phone].  3. The patient to follow up with [facility] Interventional      Radiology as needed and to schedule as needed at number [phone].   ISSUES FOR FOLLOW UP:  1. Resolution of perinephric abscess and decision whether to remove      drain or not along with antibiotic course and length.  2. Tolerance of new diabetes medication regimen increased to 44 units      b.i.d. of NovoLog 70/30.      Eustaquio Boyden, MD  Electronically  Signed  Wayne A. Sheffield Slider, M.D.  Electronically Signed    JG/MEDQ  D:  12/19/2007  T:  12/20/2007  Job:  [id number]   cc:   Lindaann Slough, M.D.  [name], MD

## 2010-11-23 NOTE — Consult Note (Signed)
[name], [name]               ACCOUNT NO.:  [id number]   MEDICAL RECORD NO.:  [id number]          PATIENT TYPE:  INP   LOCATION:  5151                         FACILITY:  [facility]   PHYSICIAN:  Lindaann Slough, M.D.  DATE OF BIRTH:  14-Aug-1970   DATE OF CONSULTATION:  11/22/2007  DATE OF DISCHARGE:                                 CONSULTATION   REASON FOR CONSULTATION:  Right flank pain.   The patient is a 40 years old female who has been complaining of right-  sided abdominal pain associated with nausea and vomiting for the past 7  days.  The pain started suddenly a week ago and was radiating from the  right flank to the umbilicus.  The pain is constant and severe.  She has  a history of juvenile diabetes, COPD, asthma, and endometriosis.  She  came to the emergency room on Nov 16, 2007, a renal ultrasound showed  normal kidneys.  She was sent home and then was seen in the family  practice clinic on Nov 21, 2007.  She was given Vicodin, but she  continued to have nausea and vomiting, and she was then admitted for  further evaluation.  A CT scan showed a complex soft tissue area in the  right perinephric space and decreased enhancement in the mid and lower  pole of the right kidney.  Her white count is 15.1.  I was then asked to  see the patient for further evaluation and treatment.   PAST MEDICAL HISTORY:  Positive for diabetes, COPD, asthma, and deep  vein thrombosis.   PAST SURGICAL HISTORY:  She had a laparoscopy for endometriosis.   ALLERGIES:  She is allergic to PENICILLIN and SINGULAIR.   MEDICATIONS:  She is on:  1. Ventolin insulin.  2. Lantus insulin.  3. NovoLog.  4. Glucophage 1500 mg twice a day.  5. Zocor 40 mg daily.  6. Aspirin 81 mg a day.   SOCIAL HISTORY:  She is married, has two stepchildren, smoked 3 to 5  cigarettes a day for about 23 years, and does not drink.   FAMILY HISTORY:  Positive for diabetes.  Her father died of  complications of diabetes.   Her mother has diabetes, heart disease, and  hypertension.  She has two brothers with hypertension and one of them  has diabetes, and she has one sister who has asthma and COPD.   REVIEW OF SYSTEMS:  As noted in the HPI and everything else is negative.  She has frequency, but no dysuria or hematuria.   PHYSICAL EXAMINATION:  This is a well-developed 40 years old female, who  is complaining of right-sided abdominal pain.  She is alert and oriented  to time, place, and person.  Blood pressure is 118/72, pulse 81, respirations 20, and temperature  98.5.  HEAD:  Normal.  She has pink conjunctivae.  Ears, nose, and throat are  within normal limits.  NECK:  Supple.  She has no cervical adenopathy.  No thyromegaly.  CHEST:  Symmetrical.  LUNGS:  Fully expanded and clear to percussion and auscultation.  HEART:  Regular rhythm.  ABDOMEN:  Obese, tender in the right flank, and she has a severe right  CVA tenderness.  Kidneys are not palpable.  Liver and spleen are not  palpable.  Bladder is not distended.  Bowel sounds are normal.  GENITALIA:  She has normal female genitalia.  The meatus is normal.  There is no cystocele.  PELVIC EXAMINATION:  There is no tenderness in the bladder area.  The  cervix is firm in the midline, nontender, and there is no adnexal mass.   LABORATORY DATA:  Hemoglobin is 11.4, hematocrit 32.5, and WBC 15.1.  BUN 8 and creatinine 0.84.  Urinalysis is negative.   IMPRESSION:  1. Right perinephric abscess.  2. Right pyelonephritis.  3. Diabetes.  4. Chronic obstructive pulmonary disease.  5. Asthma.  6. History of deep vein thrombosis.   SUGGESTION:  The patient needs percutaneous drainage of perinephric  abscess.  I have discussed this with Dr. Deanne Coffer, and he agrees, and he  will schedule the patient for the procedure.  Need to start IV  antibiotics.  I took the liberty to write orders for IV Cipro.   Thank you.  I will follow the patient with you.       Lindaann Slough, M.D.  Electronically Signed     MN/MEDQ  D:  11/22/2007  T:  11/23/2007  Job:  [id number]

## 2010-11-23 NOTE — H&P (Signed)
[name], [name]               ACCOUNT NO.:  [id number]   MEDICAL RECORD NO.:  [id number]          PATIENT TYPE:  OBV   LOCATION:  3707                         FACILITY:  [facility]   PHYSICIAN:  Nestor Ramp, MD        DATE OF BIRTH:  Apr 26, 1971   DATE OF ADMISSION:  09/01/2007  DATE OF DISCHARGE:  09/02/2007                              HISTORY & PHYSICAL   PRIMARY CARE PHYSICIAN:  [name], M.D., [facility].   CHIEF COMPLAINT:  Chest pain with shortness of breath.   HISTORY OF PRESENT ILLNESS:  [name] is a 40 year old white female with  history of diabetes mellitus type 2, hypertriglyceridemia, tobacco  dependence, and COPD, that presents to [facility] emergency  department with complaints of substernal chest pain, described as  pressure and tightness with feeling like someone punched me in my  chest. It was bought on with exertion today while walking and not  relieved with rest. Initially, was 10 out of 10 pain and has lasted for  greater than 4 hours. When she arrived to the emergency department, she  received some nitroglycerin and reports some relief. She also has some  associated dyspnea that was not improved with O2 or rest. The pain  radiates across her chest but not into the neck or arms. No nausea or  vomiting but positive diaphoresis. The pain is worse with palpitation  and deep inspiration. She has had similar symptoms in the past 2 days,  brought on with exertion and improved with rest.   PAST MEDICAL HISTORY:  1. Hypertriglyceridemia.  2. Anxiety.  3. COPD.  4. Obesity.  5. Diabetes mellitus type 2.  6. Tobacco dependence.   PAST SURGICAL HISTORY:  None.   ALLERGIES:  PENICILLIN, SINGULAIR.   MEDICATIONS:  1. Albuterol 90 mcg MDI.  2. Glipizide 10 mg p.o. b.i.d.  3. NovoLog 70/30 40 units at dinner and 25 units at breakfast.  4. Spiriva 1 inhalation daily.  5. Advair 500/50 1 puff b.i.d.  6. Fluoxetine 10 mg p.o.  daily.  7. Metformin 500 mg p.o. daily.  8. Phenofibrin 160 mg p.o. daily.   FAMILY HISTORY:  Mother has diabetes mellitus type 2 and status post  bilateral below the knee amputation.   SOCIAL HISTORY:  She is married. 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  Recently quit smoking. Prior smoking was 1 1/2 pack per day. No alcohol  or illicit drug use.   REVIEW OF SYSTEMS:  No fevers, no chills, no night sweats. No nausea and  vomiting. No abdominal pain, no paresthesia, no hematuria. Positive  chest pain, shortness of breath, pain of the feet, urinary frequency,  and decreased sensation in the feet.   PHYSICAL EXAMINATION:  VITAL SIGNS:  Temperature 97.3, pulse 90's to  100's, blood pressure 130's to 140's systolic and 70's to 80's  diastolic. Respiratory rate 20 and 100% on room air.  GENERAL:  Awake, alert, and pleasant, obese female. No acute distress.  HEENT:  Extraocular muscles intact. Pupils are equal, round, and  reactive to light  and accommodation. Conjunctival injection right  greater than left. Dry mucous membranes.  CARDIOVASCULAR:  No JVD. Regular rate and rhythm.  No murmur, rub, or  gallop.  RESPIRATORY:  Clear to auscultation bilaterally. No wheezing and no  crackles.  ABDOMEN:  Soft, nontender, and nondistended. Obese. Positive bowel  sounds.  EXTREMITIES:  Trace edema in lower extremities.  NEUROLOGIC:  Cranial nerves 2-12 are grossly intact. Sensation is  grossly intact with 5/5 strength.  SKIN:  No rashes.   LABORATORY DATA:  White blood cell count 14.3. Hemoglobin 14.8. Platelet  320,000. Sodium 131, potassium 4.2, bicarb 27.6, creatinine 0.9 and  glucose of 449. Point of care enzymes are negative.   EKG showed normal sinus rhythm.   Chest x-ray showed chronic bronchitic changes with no acute  abnormalities.   ASSESSMENT:  This is a 40 year old white female with:   PROBLEM LIST:  1. CHEST PAIN:  Differential diagnosis is cardiac versus pulmonary       versus gastroesophageal reflux disease versus pulmonary embolism.      Plan is initially to rule out  lift threatening events such as      noted above. EKG and point of care enzymes were negative. Will      admit to telemetry bed to rule out for myocardial infarction. We      will cycle her cardiac enzymes. Will risk stratify her by obtaining      a lipid panel. The last one was taken in November of 2008. Also,      get hemoglobin A1C, which was also last checked in November 2008      and as 2.9. Will repeat an EKG in the morning. Will also check a      TSH to see if this could be a cause of her chest pain. Will also      provide O2 and start her on aspirin and give nitroglycerin p.r.n.      Will rule out a PE with a D-dimer, given that she has a low risk.      Will rule out GERD and provide with PPI. Given the fact that she      had some pleuritic pain and pain with palpation, will treat her      with NSAIDS at this time.  2. CHRONIC OBSTRUCTIVE PULMONARY DISEASE:  Will continue her home      medications and provide O2.  3. DIABETES TYPE 2:  Will hold her home medications and place her on      sliding scale insulin, resistant, and check a hemoglobin. We may      add on the Amaryl and Metformin if no procedures are to be done.  4. HYPERTRIGLYCERIDEMIA:  Repeat a lipid panel in the morning. Will      continue the phenofibrinand will likely start a statin, given her      risk factors.  5. FEN:  Place her on a Carb-modified diet and replete her fluid      losses with IV fluid.   DISPOSITION:  This is pending her rule out myocardial infarction and  pulmonary embolism workup. She is going to need some financial  assistance for her medications, so we will refer to by care management  to [facility]      [name], MD  Electronically Signed      Nestor Ramp, MD  Electronically Signed    KL/MEDQ  D:  09/02/2007  T:  09/02/2007  Job:  [id number]
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
## 2010-11-26 NOTE — Discharge Summary (Signed)
[name], [name]             ACCOUNT NO.:  [id number]   MEDICAL RECORD NO.:  [id number]          PATIENT TYPE:  INP   LOCATION:  3039                         FACILITY:  [facility]   PHYSICIAN:  Devra Dopp, MD     DATE OF BIRTH:  1971/05/07   DATE OF ADMISSION:  01/21/2006  DATE OF DISCHARGE:  01/22/2006                                 DISCHARGE SUMMARY   DATE OF ADMISSION:  01/21/2006   DATE OF DISCHARGE:  01/22/2006   DISCHARGE DIAGNOSES:  1.  Asthma exacerbation.  2.  Respiratory infection.  3.  Type 2 diabetes.  4.  History of recurrent deep venous thromboses.  5.  Tobacco abuse.   DISCHARGE MEDICATIONS:  1.  Prednisone 80 mg p.o. daily, to complete a 5 day course.  2.  Albuterol nebulizer q. 4 hours scheduled on day of discharge with every      4 hour p.r.n. after that.  3.  Lantus 30 units q.h.s., sliding scale insulin covered with meals.  4.  Duratuss b.i.d.  Samples given.  5.  Robitussin DM p.r.n. for cough.   1.  The patient with a history of recurrent DVTs.  A hypercoagulability      panel was drawn prior to patient discharge and will need to be followed      up by PCP.  2.  The patient to follow up with [name] for      establishment of primary care in the next week, with either Dr. Providence Lanius      or Dr. Mayford Knife.  3.  The patient with inability to pay for medications once primary care is      established.  Will need referral to [facility] for [facility] pharmacy      certification to obtain her medications.   Discharge labs pending at time of dictation.  Of note, lower extremity  Dopplers were negative prior to discharge.   HOSPITAL COURSE:  A 40 year old white female with a recent URI symptoms who  developed increasing shortness of breath with increased use of her albuterol  nebulizers over a period of 4-5 days, leading up to her admission.  The  patient had run out of nebulizer IsoLution as well as her albuterol MDI.   PROBLEM LIST:  1.   Asthma exacerbation.  The patient was treated in the ED for her asthma      exacerbation without significant resolution of symptoms.  Was therefore      admitted overnight.  Given albuterol and Atrovent nebulizers every 4      hours, then 2 hours p.r.n.  The patient did not require her p.r.n.      nebulizers while in the hospital.  Additionally she was started on a      course of oral steroids for hopeful improvement of her symptoms.  The      patient was complaining of some musculoskeletal chest pain secondary to      prolonged coughing, which was treated with ibuprofen.  The patient      maintained reasonable O2 saturations, with her lowest being 94 during  her entire admission.  She had pre and post peak flows which were 150      and 220 respectively.  Day of discharge, the patient's breathing had      much improved.  Her lung exam had gone from having both inspiratory and      expiratory wheezes, to only expiratory wheezes, with improved air      movement.  A chest x-ray was clear on admission, other than some      peribronchial thickening.  The patient was discharged home on steroids      and albuterol as mentioned above.  2.  Type 2 diabetes.  The patient has a history of type 2 diabetes and had      been out of her Lantus for at least a year, was obtaining [employer]'s      subcu insulin for those who are unable to pay for their medication,      which she had been mixing and dosing on her own, stating that her blood      sugars have been in the 100s.  On admission, the patient's blood sugar      was in the 280s.  She was started on her previous home does of Lantus,      which is 20 units q.h.s., along with sliding scale insulin.  Hours after      admission, the patient's blood sugar was in the 400s, presumably thought      to be secondary to use of the prednisone.  She is continued on sliding      scale insulin at discharge.  The patient still at elevated blood sugars,      but no  signs of DKA.  She was given samples of Lantus for home, started      on 30 units q.h.s. and with instructions to check her blood sugars with      meals and given some samples of regular insulin for meal coverage.      Hemoglobin A1c in the hospital was 8.2.  The patient understands that      there will be a need to adjust her insulin doses over the coming weeks,      to adjust for the use and then the discontinuation of her prednisone.  3.  History of recurrent DVTs.  The patient does have a history of recurrent      DVTs.  On admission, she was asymptomatic.  Related to this, her last      DVT in 11/2005.  She has never been worked up for these recurrent DVTs,      but reports that she has a brother that also gets them on a regular      basis and that she is usually treated in the emergency room on Coumadin.      The patient states that she ran out of Coumadin.  She was put on Lovenox      for DVT prophylaxis while in the hospital.  The patient did report that      her left calf was larger than the other, but usually was that way.  Just      as a precaution, bilateral lower extremity Dopplers were obtained prior      to discharge, which were negative.  Hypercoagulability panel was drawn      prior to patient discharge, which were pending at time of this dictation      and will need to be followed up as  an outpatient.  4.  Tobacco abuse.  The patient continues to be a smoker.  Is not ready to      quit at this time, but was encouraged to do so to help with her      respiratory illness.  This will need to be followed up as an outpatient.      Devra Dopp, MD     TH/MEDQ  D:  01/22/2006  T:  01/23/2006  Job:  [id number]   cc:   Devra Dopp, MD  Fax: [phone]

## 2010-11-26 NOTE — H&P (Signed)
[name], [name]             ACCOUNT NO.:  [id number]   MEDICAL RECORD NO.:  [id number]          PATIENT TYPE:  INP   LOCATION:  3039                         FACILITY:  [facility]   PHYSICIAN:  Devra Dopp, MD     DATE OF BIRTH:  June 16, 1971   DATE OF ADMISSION:  01/21/2006  DATE OF DISCHARGE:  01/22/2006                                HISTORY & PHYSICAL   PRIMARY CARE PHYSICIAN:  None.   CHIEF COMPLAINT:  Asthma exacerbation and shortness of breath.   HISTORY OF PRESENT ILLNESS:  A 40 year old white female with a 4-5 day  history of cough and shortness of breath, sore throat productive of scant  yellow sputum.  Patient has a history of asthma, using her nebs q.1-2 hours,  complaining of chest discomfort secondary to persistent cough.  Denies  recent disk contacts, no fevers.  She was treated in the ED with nebulizers  without significant improvement of her breathing.   REVIEW OF SYSTEMS:  Negative for nausea, vomiting, diarrhea.  No urinary  complaints.  She does complain of some rhinorrhea, also complaining of some  chest wall pain.  As a side note, she had been seen in the ED two weeks ago  for her chest pain and had a negative D-dimer at the time.   PAST MEDICAL HISTORY:  Significant for:  1.  Asthma.  2.  Type 2 diabetes.  3.  History of endometriosis.  4.  History of recurrent DVTs, her last being in May of 2007.   SHE HAS AN ALLERGY TO PENICILLIN.   CURRENT MEDICATIONS:  Include:  1.  Naprosyn.  2.  Lantus 20 units q.h.s.  3.  Albuterol nebulizers p.r.n.  4.  Ibuprofen.  5.  Aspirin.   PAST SURGICAL HISTORY:  History of laparoscopic surgery related to  endometriosis.   FAMILY HISTORY:  Asthma.  Her father has asthma and died of complications  related to diabetes.  Her Mom has coronary artery disease, hypertension and  diabetes and a sister with asthma.   SOCIAL HISTORY:  She lives in [place] with her boyfriend and 2 sons.  She  smokes 1 to 1-1/2 packs per  day of cigarettes, no alcohol, no drugs.  She  works for [employer] in [place].  She moved recently, in the last year moved from  [place] and has not established primary care.   VITAL SIGNS:  Temperature 97.1, pulse 99, respirations 20, blood pressure  118-135/68-71, 94-97% on room air.  GENERAL:  She is an overweight white female in no acute distress.  Alert and  oriented x3.  Appropriate mood and affect.  HEENT:  Pupils equal, round and reactive to light.  Lips moist.  Normocephalic, atraumatic.  Oral mucosa pink and moist.  NECK:  Supple, no lymphadenopathy.  CARDIOVASCULAR:  S1, S2 regular rate and rhythm, no murmurs.  LUNGS:  Diffuse inspiratory and expiratory wheezes with coarse breath  sounds, but does speak in nearly complete sentences.  ABDOMEN:  Positive bowel sounds, soft, obese, nontender, nondistended.  EXTREMITIES:  No lower extremity edema.  No clubbing, cyanosis or  ecchymosis.  NEURO:  Grossly intact with no focal deficit.   LABS:  Which are pending at time of dictation are a CBC with diff., CMP,  hemoglobin A1c.  Chest x-ray looked to have no acute disease but does  exhibit an old finding of a right hemidiaphragmatic defect.   ASSESSMENT AND PLAN:  A 40 year old white female with asthma exacerbation,  history of diabetes.  1.  Asthma exacerbation.  DuoNeb q.4h. with q.1h. p.r.n. and start      prednisone.  Will also get peak flows pre and post treatment.  Will      check potassium for effects of albuterol use.  The exacerbation is      likely secondary to a upper respiratory infection and will give      ibuprofen for pain.  2.  Type 2 diabetes.  Patient states that she __________ be on Lantus but      not using it regularly.  Will check her CBGs and start sliding scale      insulin for now as well as her home Lantus dose and also check a      hemoglobin A1c.  3.  Prophylaxis Lovenox since histories of deep vein thromboses.  Protonix      for gastrointestinal.   Tobacco.  Encouraged patient to stop smoking, and      a patch if she so desires.   DISPOSITION:  Patient will need primary care at discharge.  Patient  agreeable to follow up at __________.  Patient without insurance at this  time so will need assistance to be able to afford her medications.      Devra Dopp, MD     TH/MEDQ  D:  01/22/2006  T:  01/22/2006  Job:  [id number]

## 2010-12-03 IMAGING — CT CT ABD-PELV W/ CM
2 of 5 series · 17 of 46 positions shown, 19 images · IV contrast (APPLIED)
Comparison: 12/26/2007

CLINICAL DATA: Right flank pain.  Fever and chills.  Diabetes.
Previous percutaneous drainage of right perinephric abscess.

CT ABDOMEN AND PELVIS WITH CONTRAST
TECHNIQUE: Multidetector CT imaging of the abdomen and pelvis was
performed following the standard protocol during bolus
administration of intravenous contrast.
Contrast: 100 ml 2mnipaque-599 and oral contrast

[Series 2: abd/pelv with 5.0 b31f st · axial · 0.77mm/px · z∈[-337,+98]mm · 14 of 99 slices shown, 16 images]
[im 6/99  soft-tissue]
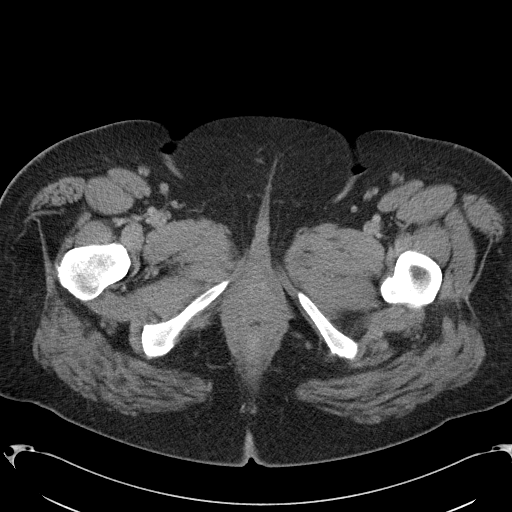
[im 6/99  bone]
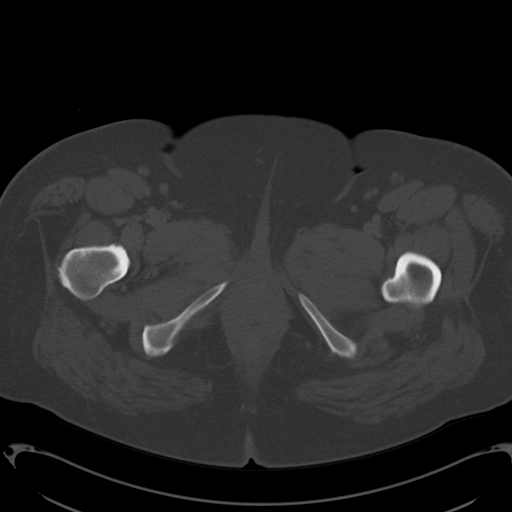
[im 11/99  soft-tissue]
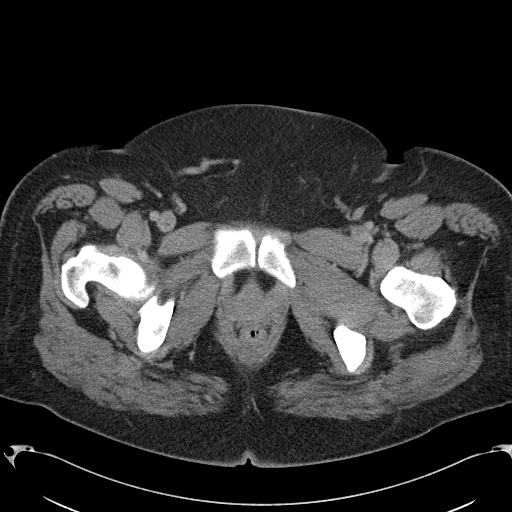
[im 21/99  soft-tissue]
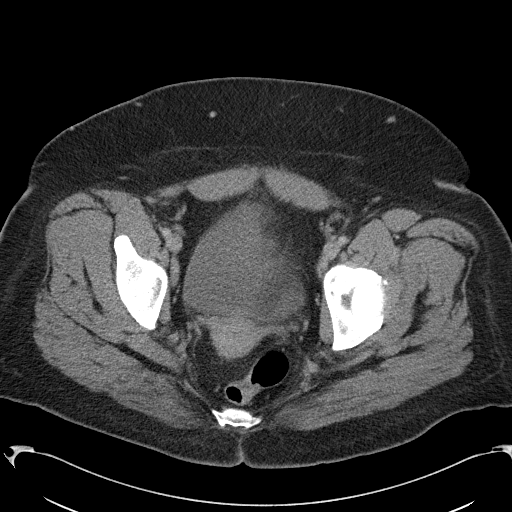
[im 26/99  soft-tissue]
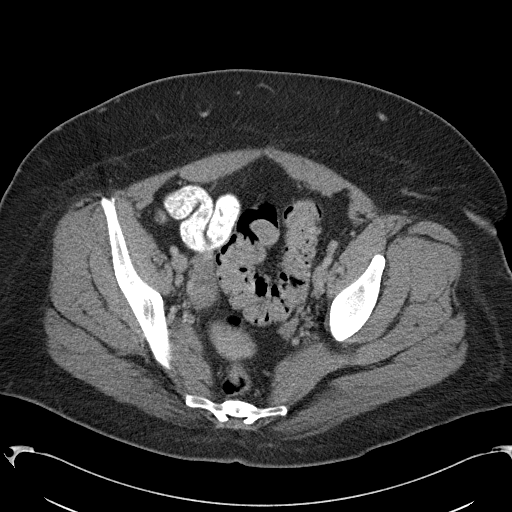
[im 31/99  soft-tissue]
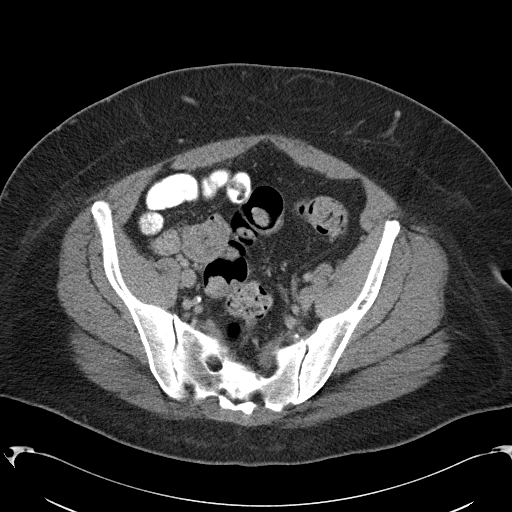
[im 42/99  soft-tissue]
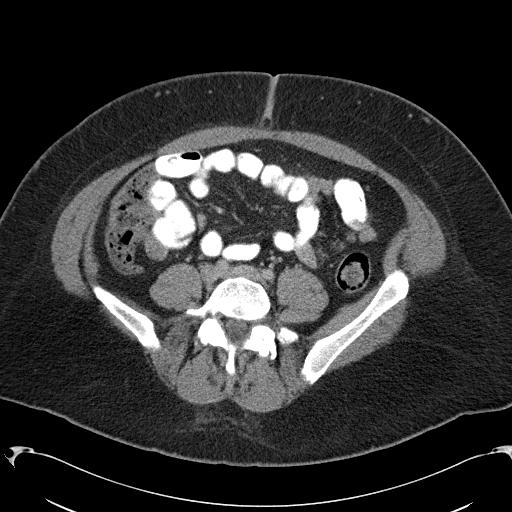
[im 47/99  soft-tissue]
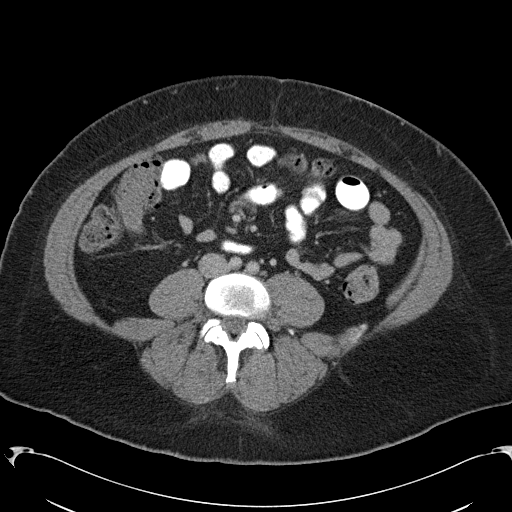
[im 52/99  soft-tissue]
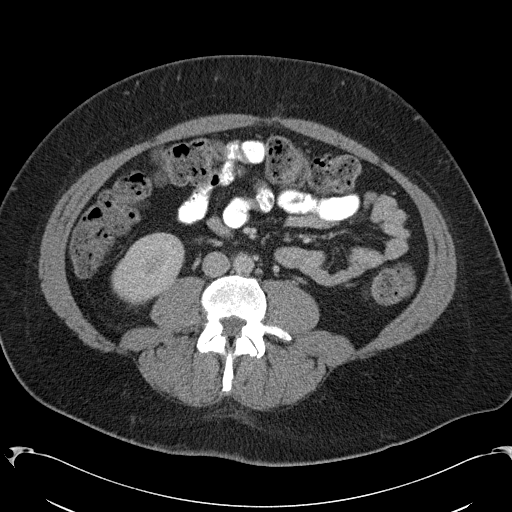
[im 57/99  soft-tissue]
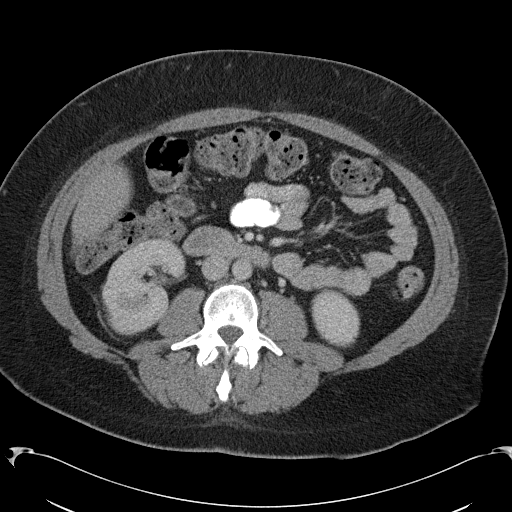
[im 57/99  bone]
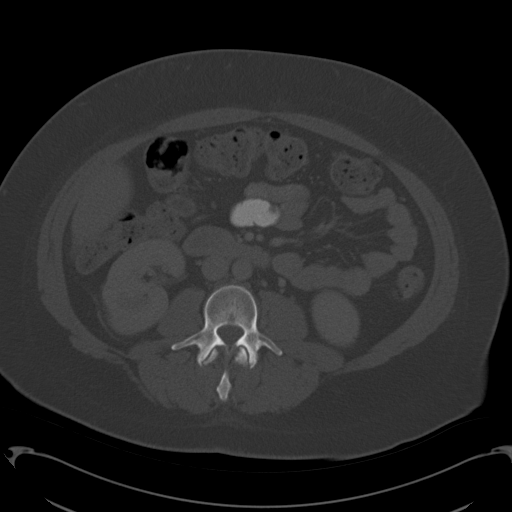
[im 68/99  soft-tissue]
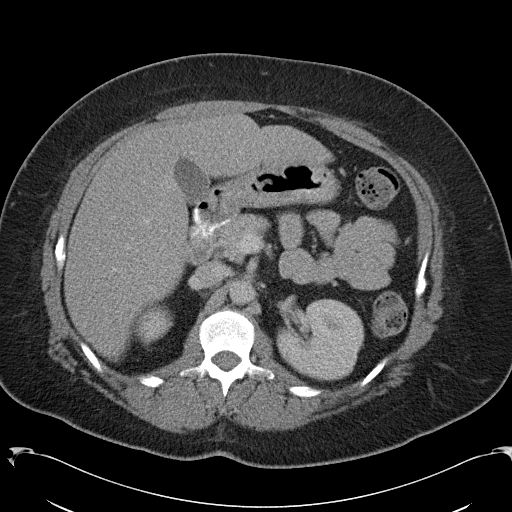
[im 73/99  soft-tissue]
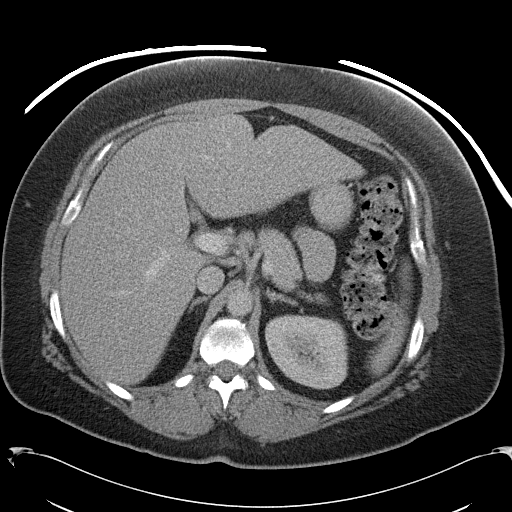
[im 78/99  soft-tissue]
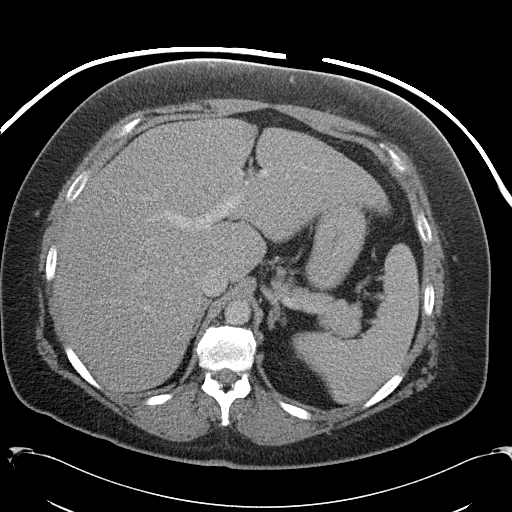
[im 88/99  soft-tissue]
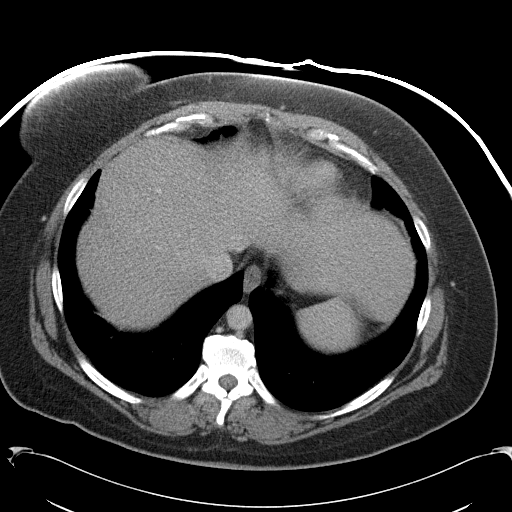
[im 93/99  soft-tissue]
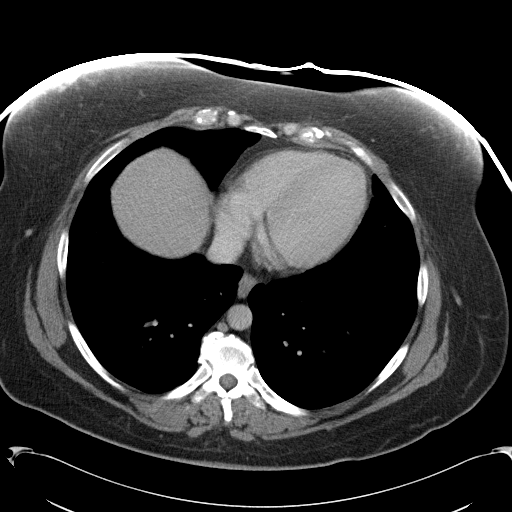

[Series 5: abd/pelv with 2.0 spo cor st · coronal · 0.96mm/px · 3 of 144 slices shown]
[im 48/144  soft-tissue]
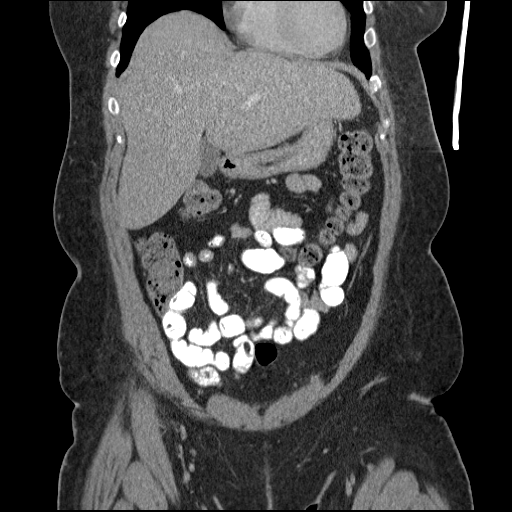
[im 64/144  soft-tissue]
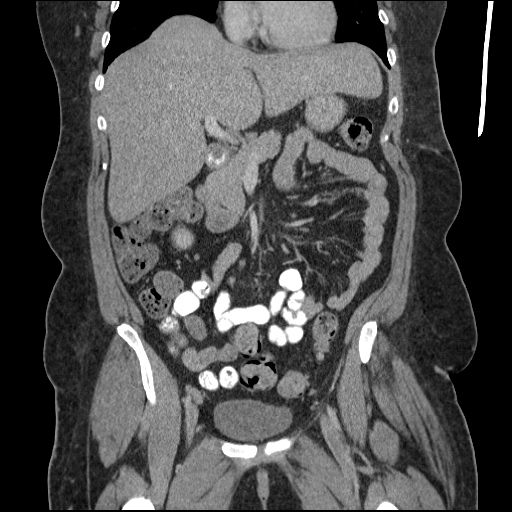
[im 80/144  soft-tissue]
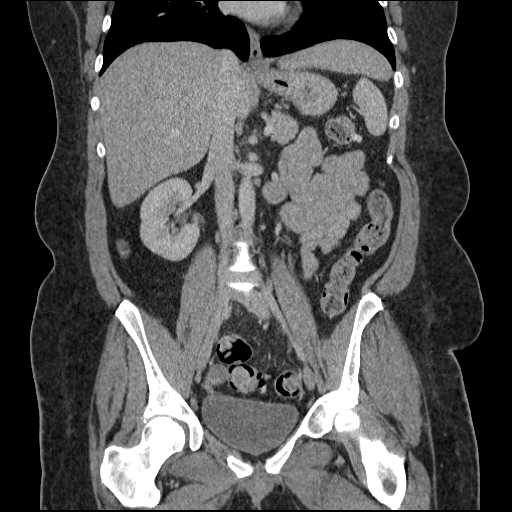

[17 of 46 positions shown; findings below may reference images not displayed]

FINDINGS: No evidence of renal mass or abscess.  No evidence of
hydronephrosis.  No evidence of acute perinephric inflammatory
changes or abnormal fluid collections.  Right perinephric
percutaneous drainage catheter has been removed since previous
study.

Mild diffuse hepatic steatosis again noted.  No liver masses are
identified.  The gallbladder, spleen, pancreas, and adrenal glands
are normal in appearance.  No soft tissue masses or lymphadenopathy
identified within the abdomen or pelvis.

Colonic diverticulosis is noted.  There is no evidence of
diverticulitis.  No other inflammatory process or abnormal fluid
collections are identified.  Uterus appears normal.  1.9 cm right
ovarian cyst seen, however no evidence of adnexal mass or
inflammatory process.
IMPRESSION: 1.  No acute findings in the abdomen or pelvis.  No evidence of
renal abscess or mass.
2.  Mild hepatic steatosis.
3. Diverticulosis.  No radiographic evidence of diverticulitis.

## 2010-12-17 IMAGING — CR DG CHEST 1V PORT
1 series · 1 of 1 positions shown · non-contrast
Comparison: 12/04/2009

CLINICAL DATA: Headache, possible stroke

PORTABLE CHEST - 1 VIEW

[AP]
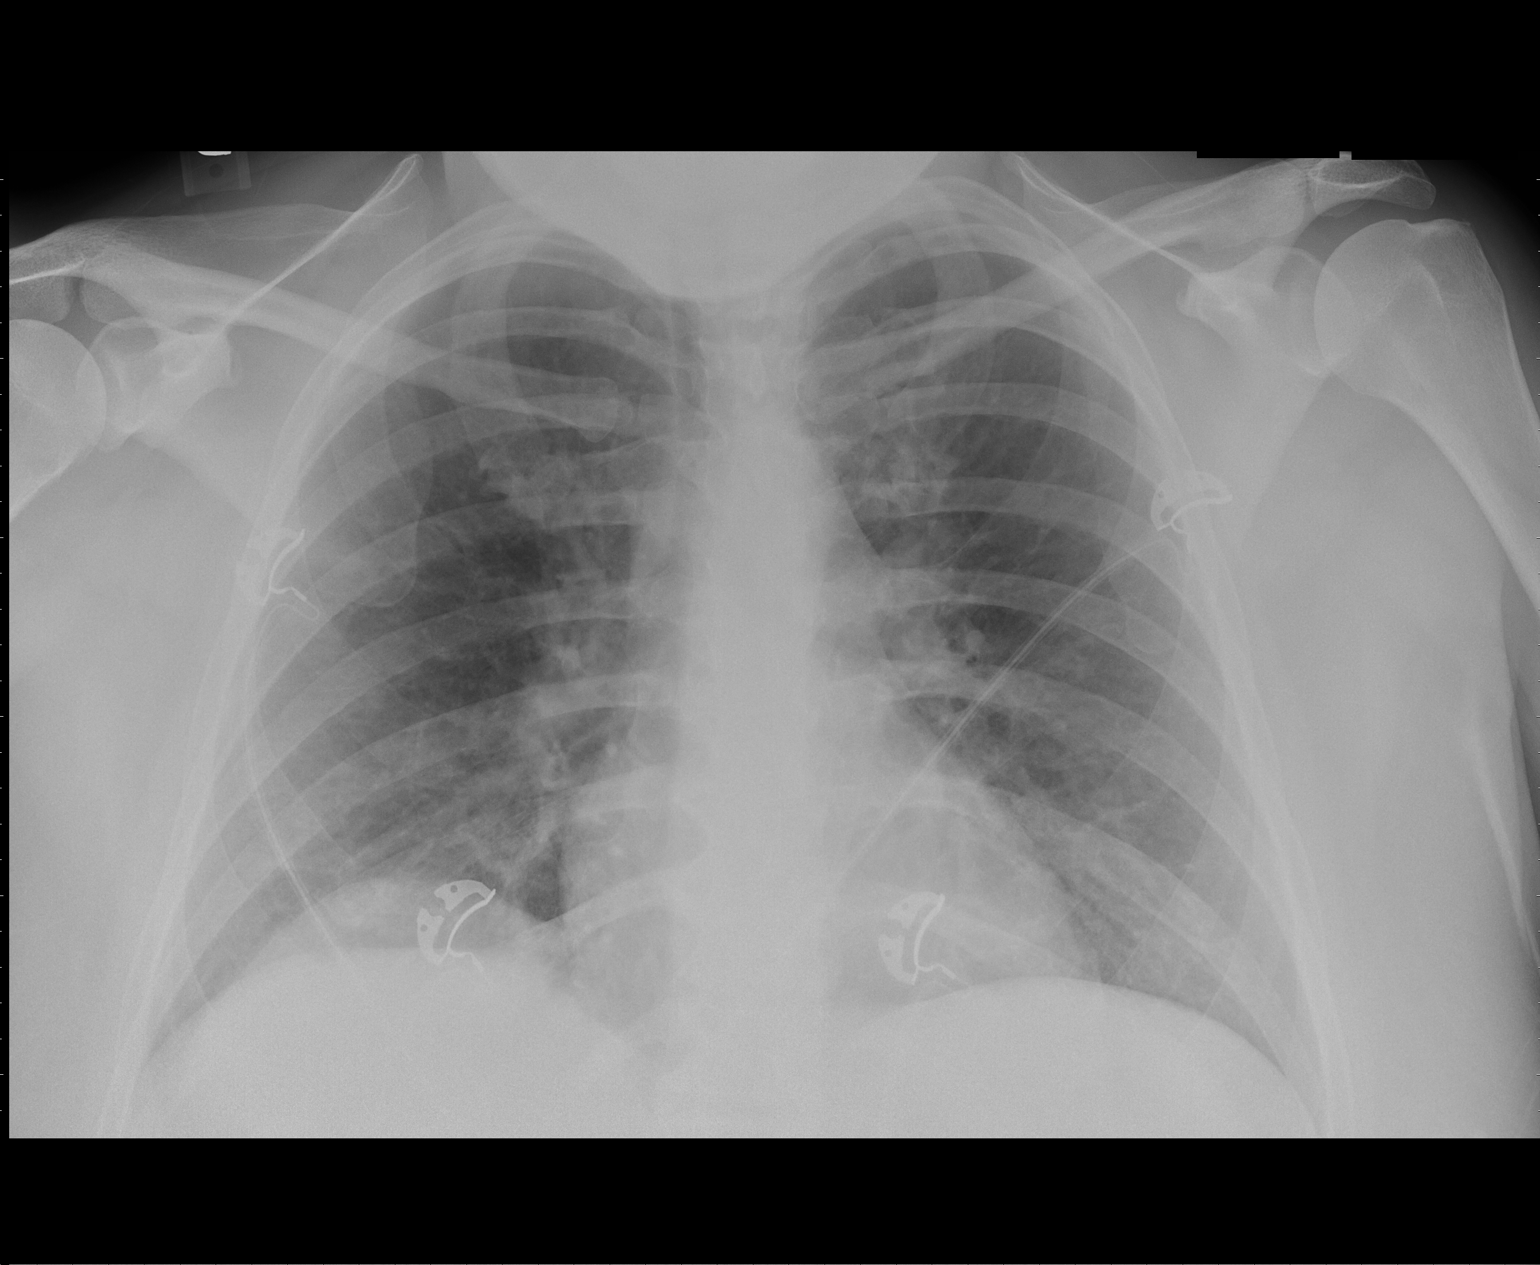

[1 of 1 positions shown; findings below may reference images not displayed]

FINDINGS: Mild vascular congestion without edema.  No effusion.
Hypoventilation with mild bibasilar atelectasis.
IMPRESSION: Mild pulmonary vascular congestion

Mild bibasilar atelectasis.

## 2010-12-17 IMAGING — CT CT HEAD W/O CM
1 series · 16 of 30 positions shown, 20 images · non-contrast
Comparison: 10/20/2008.

CLINICAL DATA: Left-sided weakness.

CT HEAD WITHOUT CONTRAST
TECHNIQUE: Contiguous axial images were obtained from the base of
the skull through the vertex without contrast.

[Series 2: head routine 4.8 h37s · axial · 0.43mm/px · z∈[-131,+2]mm · 16 of 30 slices shown, 20 images]
[im 2/30  brain]
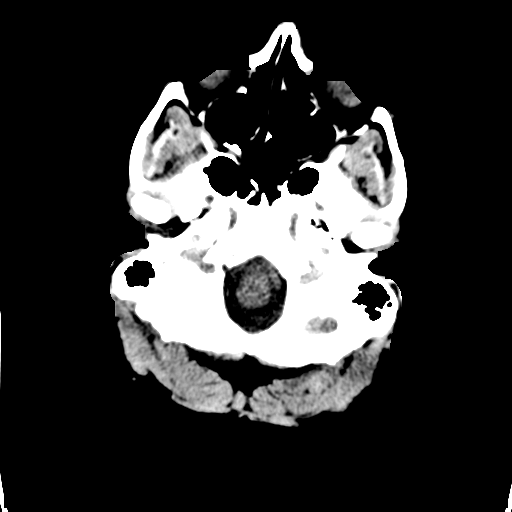
[im 2/30  bone]
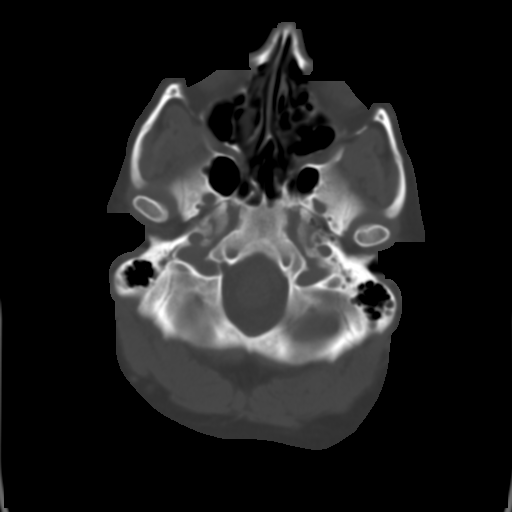
[im 4/30  brain]
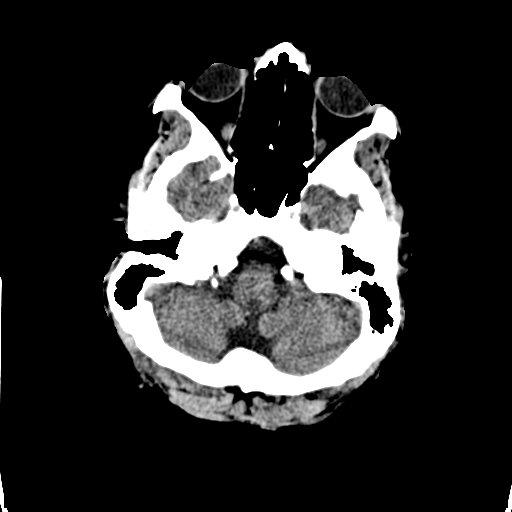
[im 6/30  brain]
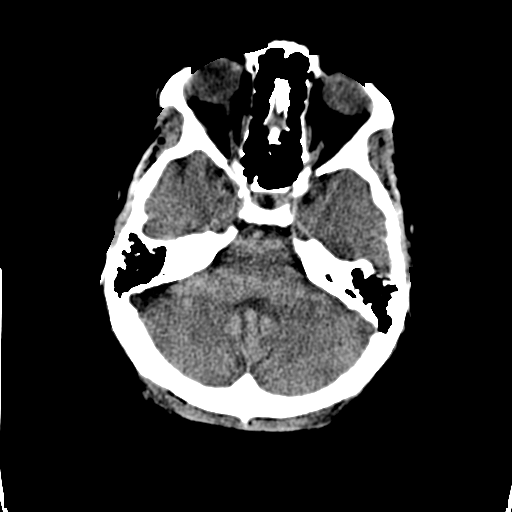
[im 8/30  brain]
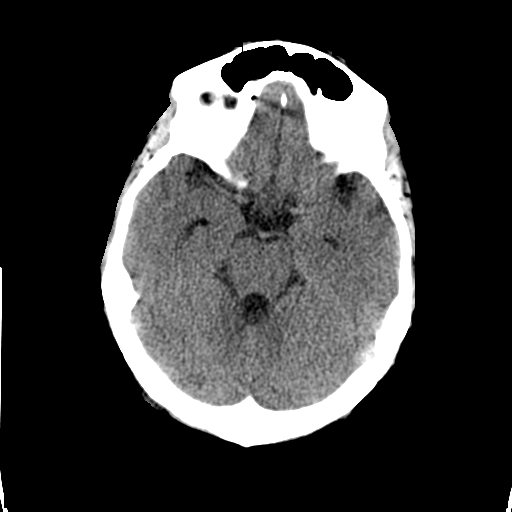
[im 9/30  brain]
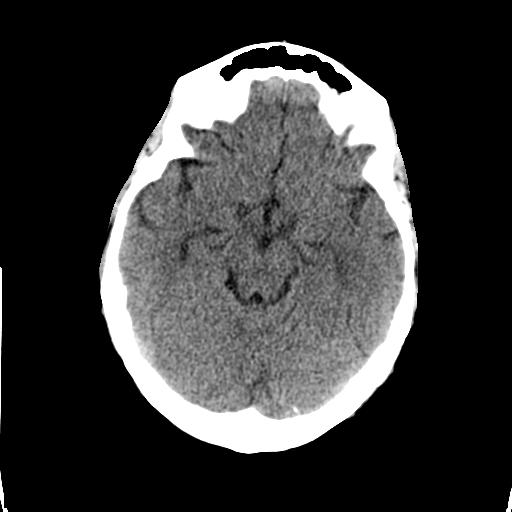
[im 9/30  bone]
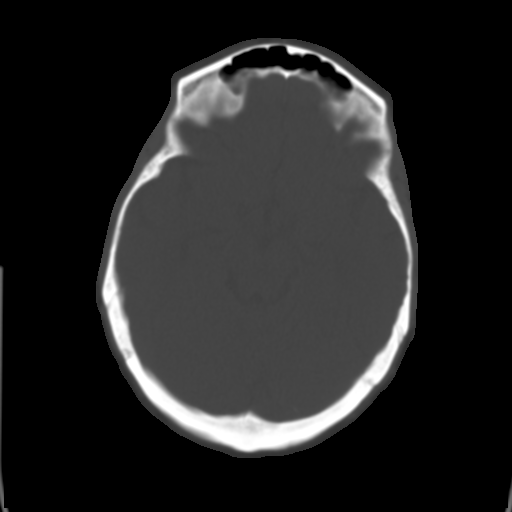
[im 11/30  brain]
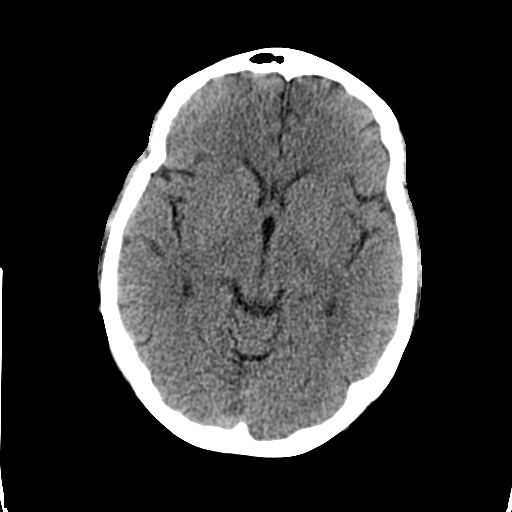
[im 13/30  brain]
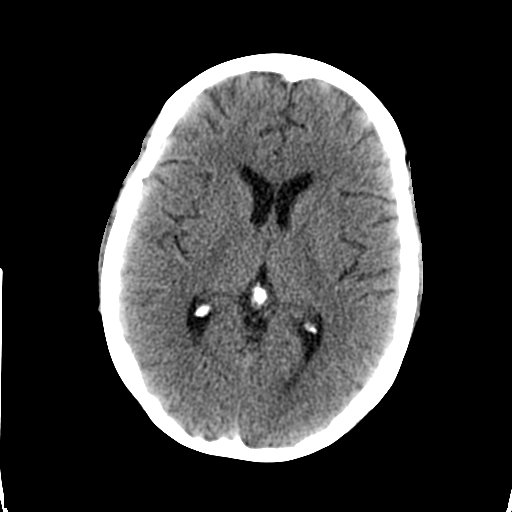
[im 15/30  brain]
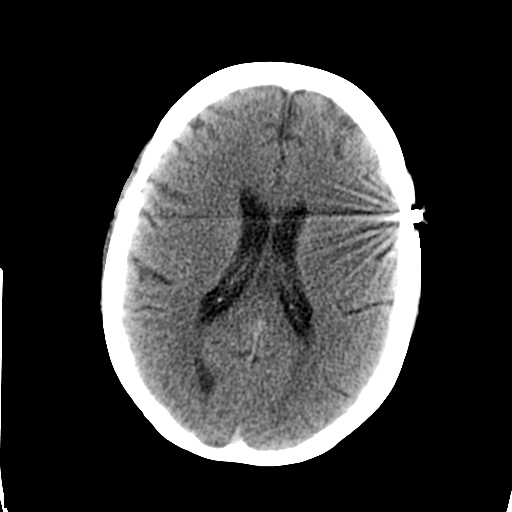
[im 16/30  brain]
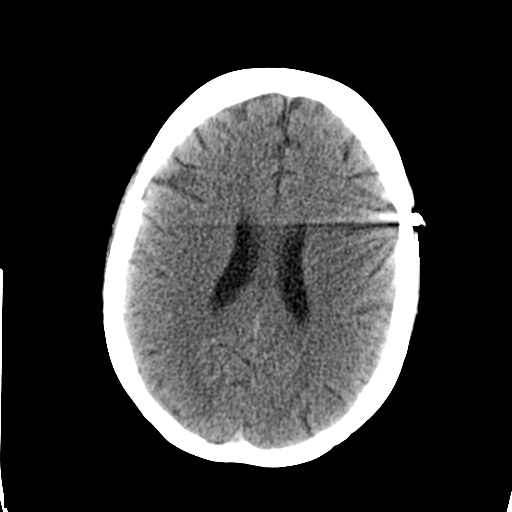
[im 16/30  bone]
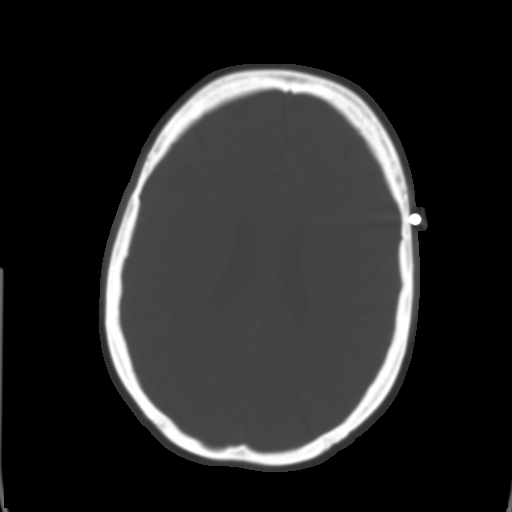
[im 18/30  brain]
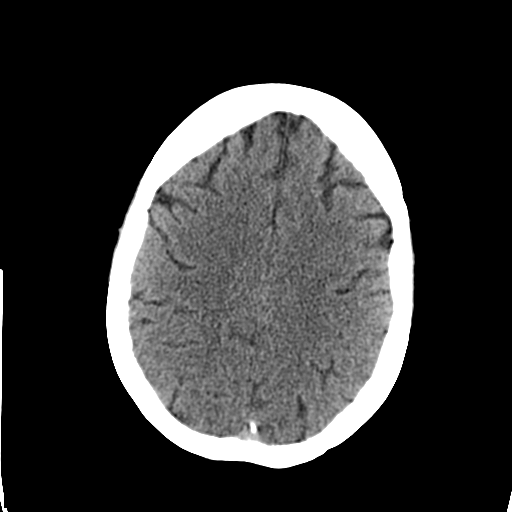
[im 20/30  brain]
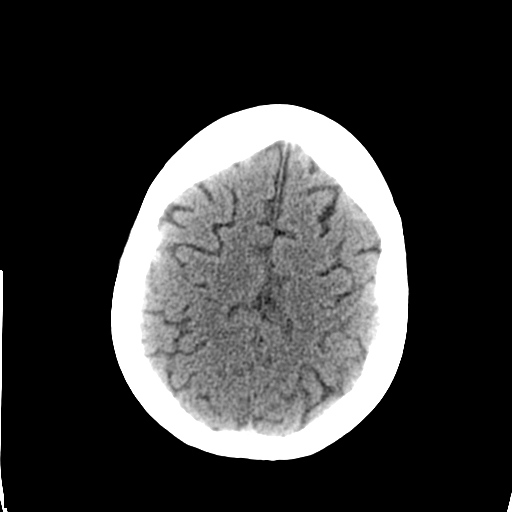
[im 22/30  brain]
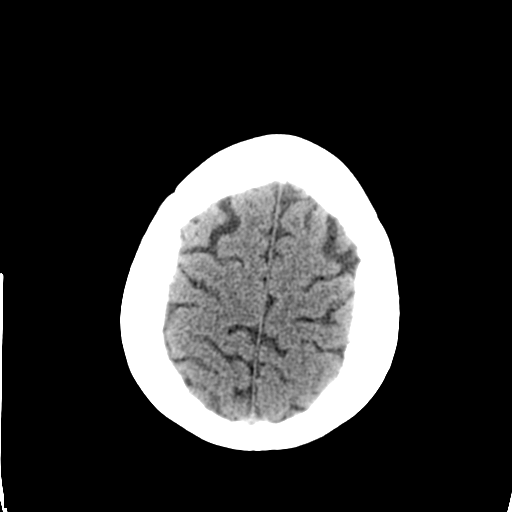
[im 23/30  brain]
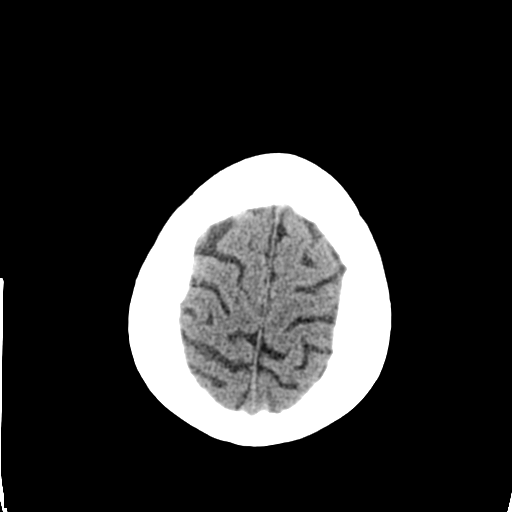
[im 23/30  bone]
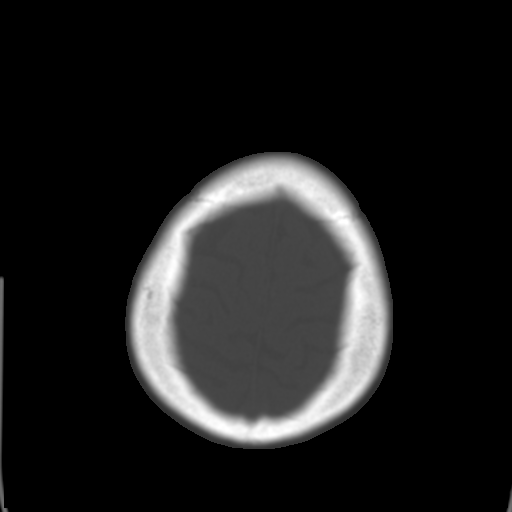
[im 25/30  brain]
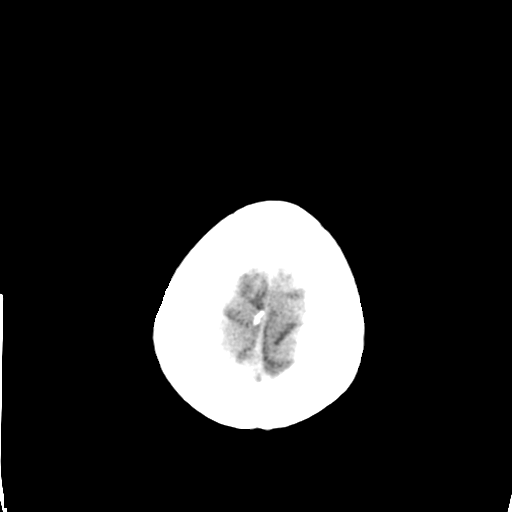
[im 27/30  brain]
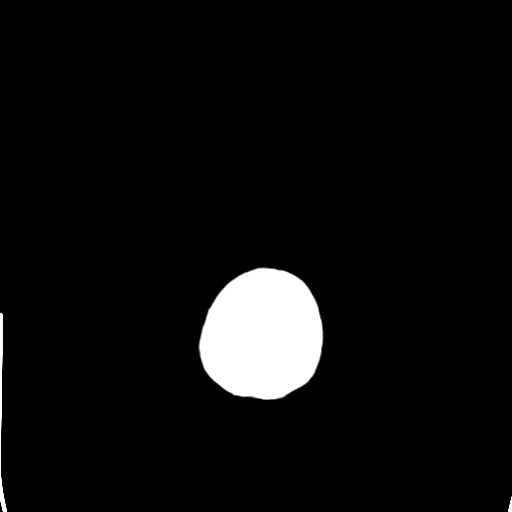
[im 29/30  brain]
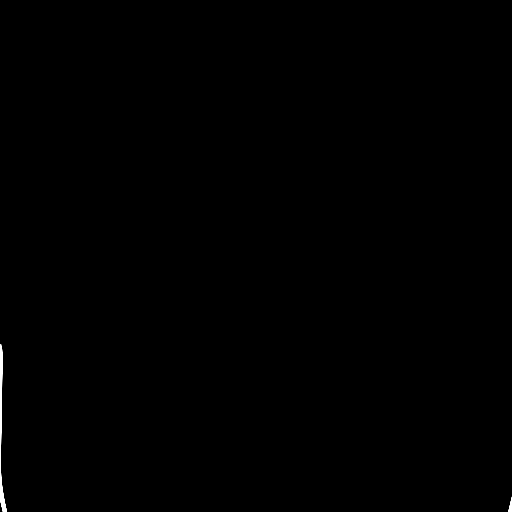

[16 of 30 positions shown; findings below may reference images not displayed]

FINDINGS: Metallic structure (BB) subcutaneous region left frontal
area causes streak artifact.

Visualized paranasal sinuses, mastoid air cells and middle ear
cavities are clear.

No intracranial hemorrhage. No CT evidence of large acute infarct.
Small acute infarct cannot be excluded by CT.  No intracranial mass
detected on this unenhanced exam.
IMPRESSION: No intracranial hemorrhage or CT evidence of large acute infarct.

Metallic structure (BB) subcutaneous region left frontal area
causes streak artifact.

## 2011-03-03 ENCOUNTER — Other Ambulatory Visit: Payer: Self-pay | Admitting: Family Medicine

## 2011-03-03 NOTE — Telephone Encounter (Signed)
Refill request

## 2011-04-01 LAB — COMPREHENSIVE METABOLIC PANEL
ALT: 34
AST: 19
Albumin: 3.9
Alkaline Phosphatase: 85
BUN: 14
CO2: 28
Calcium: 9.3
Chloride: 97
Creatinine, Ser: 0.89
GFR calc Af Amer: >60
GFR calc non Af Amer: >60
Glucose, Bld: 340 — ABNORMAL HIGH
Potassium: 4.2
Sodium: 132 — ABNORMAL LOW
Total Bilirubin: 0.7
Total Protein: 7.4

## 2011-04-01 LAB — LIPID PANEL
Cholesterol: 217 — ABNORMAL HIGH
HDL: 32 — ABNORMAL LOW
LDL Cholesterol: UNDETERMINED
Total CHOL/HDL Ratio: 6.8
Triglycerides: 462 — ABNORMAL HIGH
VLDL: UNDETERMINED

## 2011-04-01 LAB — I-STAT 8, (EC8 V) (CONVERTED LAB)
Acid-Base Excess: 2
BUN: 13
Bicarbonate: 27.6 — ABNORMAL HIGH
Chloride: 100
Glucose, Bld: 449 — ABNORMAL HIGH
HCT: 48 — ABNORMAL HIGH
Hemoglobin: 16.3 — ABNORMAL HIGH
Operator id: 265201
Potassium: 4.2
Sodium: 131 — ABNORMAL LOW
TCO2: 29
pCO2, Ven: 45.9
pH, Ven: 7.387 — ABNORMAL HIGH

## 2011-04-01 LAB — DIFFERENTIAL
Basophils Absolute: 0
Basophils Absolute: 0.1
Basophils Relative: 0
Basophils Relative: 1
Eosinophils Absolute: 0.3
Eosinophils Absolute: 0.3
Eosinophils Relative: 2
Eosinophils Relative: 3
Lymphocytes Relative: 21
Lymphocytes Relative: 28
Lymphs Abs: 3
Lymphs Abs: 3.1
Monocytes Absolute: 0.8
Monocytes Absolute: 1
Monocytes Relative: 7
Monocytes Relative: 7
Neutro Abs: 6.7
Neutro Abs: 9.9 — ABNORMAL HIGH
Neutrophils Relative %: 62
Neutrophils Relative %: 69

## 2011-04-01 LAB — CBC
HCT: 40
HCT: 42.3
Hemoglobin: 13.7
Hemoglobin: 14.8
MCHC: 34.2
MCHC: 35
MCV: 85.5
MCV: 86.3
Platelets: 299
Platelets: 320
RBC: 4.63
RBC: 4.94
RDW: 14.1
RDW: 14.2
WBC: 10.9 — ABNORMAL HIGH
WBC: 14.3 — ABNORMAL HIGH

## 2011-04-01 LAB — CARDIAC PANEL(CRET KIN+CKTOT+MB+TROPI)
CK, MB: 0.8
Relative Index: INVALID
Total CK: 53
Troponin I: <0.01

## 2011-04-01 LAB — POCT CARDIAC MARKERS
CKMB, poc: 1.2
Myoglobin, poc: 49.8
Operator id: 265201
Troponin i, poc: <0.05

## 2011-04-01 LAB — HEMOGLOBIN A1C
Hgb A1c MFr Bld: 11.1 — ABNORMAL HIGH
Mean Plasma Glucose: 318

## 2011-04-01 LAB — POCT I-STAT CREATININE
Creatinine, Ser: 0.9
Operator id: 265201

## 2011-04-01 LAB — D-DIMER, QUANTITATIVE: D-Dimer, Quant: 0.25

## 2011-04-01 LAB — CK TOTAL AND CKMB (NOT AT ARMC)
CK, MB: 1
Relative Index: INVALID
Total CK: 71

## 2011-04-01 LAB — TROPONIN I: Troponin I: <0.01

## 2011-04-01 LAB — TSH: TSH: 2.25

## 2011-04-06 LAB — BASIC METABOLIC PANEL
BUN: 10
BUN: 13
BUN: 4 — ABNORMAL LOW
BUN: 4 — ABNORMAL LOW
BUN: 5 — ABNORMAL LOW
CO2: 25
CO2: 26
CO2: 27
CO2: 28
CO2: 28
Calcium: 8.2 — ABNORMAL LOW
Calcium: 8.6
Calcium: 8.9
Calcium: 9.3
Calcium: 9.4
Chloride: 100
Chloride: 103
Chloride: 105
Chloride: 97
Chloride: 98
Creatinine, Ser: 0.7
Creatinine, Ser: 0.74
Creatinine, Ser: 0.78
Creatinine, Ser: 0.9
Creatinine, Ser: 0.91
GFR calc Af Amer: >60
GFR calc Af Amer: >60
GFR calc Af Amer: >60
GFR calc Af Amer: >60
GFR calc Af Amer: >60
GFR calc non Af Amer: >60
GFR calc non Af Amer: >60
GFR calc non Af Amer: >60
GFR calc non Af Amer: >60
GFR calc non Af Amer: >60
Glucose, Bld: 133 — ABNORMAL HIGH
Glucose, Bld: 148 — ABNORMAL HIGH
Glucose, Bld: 216 — ABNORMAL HIGH
Glucose, Bld: 222 — ABNORMAL HIGH
Glucose, Bld: 289 — ABNORMAL HIGH
Potassium: 4.3
Potassium: 4.3
Potassium: 4.5
Potassium: 4.5
Potassium: 4.8
Sodium: 131 — ABNORMAL LOW
Sodium: 132 — ABNORMAL LOW
Sodium: 135
Sodium: 137
Sodium: 138

## 2011-04-06 LAB — CBC
HCT: 30.5 — ABNORMAL LOW
HCT: 31.1 — ABNORMAL LOW
HCT: 32.3 — ABNORMAL LOW
HCT: 37
HCT: 38
Hemoglobin: 10.3 — ABNORMAL LOW
Hemoglobin: 10.5 — ABNORMAL LOW
Hemoglobin: 10.8 — ABNORMAL LOW
Hemoglobin: 12.2
Hemoglobin: 12.7
MCHC: 33.1
MCHC: 33.2
MCHC: 33.3
MCHC: 33.5
MCHC: 34.4
MCV: 84.5
MCV: 84.8
MCV: 85.5
MCV: 85.8
MCV: 85.9
Platelets: 366
Platelets: 409 — ABNORMAL HIGH
Platelets: 454 — ABNORMAL HIGH
Platelets: 537 — ABNORMAL HIGH
Platelets: 554 — ABNORMAL HIGH
RBC: 3.55 — ABNORMAL LOW
RBC: 3.63 — ABNORMAL LOW
RBC: 3.78 — ABNORMAL LOW
RBC: 4.37
RBC: 4.5
RDW: 14
RDW: 14.2
RDW: 14.3
RDW: 14.5
RDW: 14.7
WBC: 11.4 — ABNORMAL HIGH
WBC: 11.9 — ABNORMAL HIGH
WBC: 12.8 — ABNORMAL HIGH
WBC: 12.8 — ABNORMAL HIGH
WBC: 14.8 — ABNORMAL HIGH

## 2011-04-06 LAB — CULTURE, ROUTINE-ABSCESS

## 2011-04-07 LAB — CBC
HCT: 26.7 — ABNORMAL LOW
HCT: 27.8 — ABNORMAL LOW
HCT: 29.4 — ABNORMAL LOW
HCT: 29.9 — ABNORMAL LOW
HCT: 34.3 — ABNORMAL LOW
Hemoglobin: 10.3 — ABNORMAL LOW
Hemoglobin: 11.6 — ABNORMAL LOW
Hemoglobin: 9 — ABNORMAL LOW
Hemoglobin: 9.3 — ABNORMAL LOW
Hemoglobin: 9.8 — ABNORMAL LOW
MCHC: 33.3
MCHC: 33.3
MCHC: 33.7
MCHC: 33.8
MCHC: 34.5
MCV: 82.2
MCV: 82.9
MCV: 83
MCV: 83.2
MCV: 83.3
Platelets: 416 — ABNORMAL HIGH
Platelets: 430 — ABNORMAL HIGH
Platelets: 435 — ABNORMAL HIGH
Platelets: 470 — ABNORMAL HIGH
Platelets: 478 — ABNORMAL HIGH
RBC: 3.25 — ABNORMAL LOW
RBC: 3.35 — ABNORMAL LOW
RBC: 3.53 — ABNORMAL LOW
RBC: 3.6 — ABNORMAL LOW
RBC: 4.14
RDW: 15.2
RDW: 15.3
RDW: 15.3
RDW: 15.4
RDW: 15.6 — ABNORMAL HIGH
WBC: 12.8 — ABNORMAL HIGH
WBC: 12.9 — ABNORMAL HIGH
WBC: 14.9 — ABNORMAL HIGH
WBC: 8
WBC: 8.8

## 2011-04-07 LAB — COMPREHENSIVE METABOLIC PANEL
ALT: 19
AST: 14
Albumin: 2.5 — ABNORMAL LOW
Alkaline Phosphatase: 115
BUN: 8
CO2: 27
Calcium: 9
Chloride: 93 — ABNORMAL LOW
Creatinine, Ser: 0.72
GFR calc Af Amer: >60
GFR calc non Af Amer: >60
Glucose, Bld: 359 — ABNORMAL HIGH
Potassium: 4.5
Sodium: 130 — ABNORMAL LOW
Total Bilirubin: 0.6
Total Protein: 7.9

## 2011-04-07 LAB — URINE CULTURE: Colony Count: >=100000

## 2011-04-07 LAB — CULTURE, ROUTINE-ABSCESS

## 2011-04-07 LAB — BASIC METABOLIC PANEL
BUN: 6
CO2: 28
Calcium: 8.5
Chloride: 100
Creatinine, Ser: 0.73
GFR calc Af Amer: >60
GFR calc non Af Amer: >60
Glucose, Bld: 178 — ABNORMAL HIGH
Potassium: 4.2
Sodium: 136

## 2011-04-07 LAB — URINALYSIS, ROUTINE W REFLEX MICROSCOPIC
Bilirubin Urine: NEGATIVE
Glucose, UA: >1000 — AB
Hgb urine dipstick: NEGATIVE
Ketones, ur: 15 — AB
Leukocytes, UA: NEGATIVE
Nitrite: NEGATIVE
Protein, ur: 100 — AB
Specific Gravity, Urine: 1.045 — ABNORMAL HIGH
Urobilinogen, UA: 0.2
pH: 6.5

## 2011-04-07 LAB — DIFFERENTIAL
Basophils Absolute: 0.1
Basophils Relative: 0
Eosinophils Absolute: 0.1
Eosinophils Relative: 1
Lymphocytes Relative: 10 — ABNORMAL LOW
Lymphs Abs: 1.5
Monocytes Absolute: 0.8
Monocytes Relative: 6
Neutro Abs: 12.5 — ABNORMAL HIGH
Neutrophils Relative %: 84 — ABNORMAL HIGH

## 2011-04-07 LAB — URINE MICROSCOPIC-ADD ON

## 2011-04-07 LAB — VANCOMYCIN, TROUGH: Vancomycin Tr: 10.6

## 2011-04-07 LAB — D-DIMER, QUANTITATIVE: D-Dimer, Quant: 3.18 — ABNORMAL HIGH

## 2011-04-07 LAB — LIPASE, BLOOD: Lipase: 16

## 2011-04-21 LAB — URINALYSIS, ROUTINE W REFLEX MICROSCOPIC
Bilirubin Urine: NEGATIVE
Glucose, UA: 100 — AB
Hgb urine dipstick: NEGATIVE
Ketones, ur: NEGATIVE
Nitrite: NEGATIVE
Protein, ur: NEGATIVE
Specific Gravity, Urine: <1.005 — ABNORMAL LOW
Urobilinogen, UA: 0.2
pH: 6

## 2011-04-21 LAB — POCT PREGNANCY, URINE
Operator id: 117411
Preg Test, Ur: NEGATIVE

## 2011-04-22 LAB — DIFFERENTIAL
Basophils Absolute: 0.1
Basophils Relative: 1
Eosinophils Absolute: 0.6
Eosinophils Relative: 4
Lymphocytes Relative: 22
Lymphs Abs: 3.1
Monocytes Absolute: 0.8 — ABNORMAL HIGH
Monocytes Relative: 5
Neutro Abs: 9.8 — ABNORMAL HIGH
Neutrophils Relative %: 68

## 2011-04-22 LAB — CBC
HCT: 44.2
Hemoglobin: 15.3 — ABNORMAL HIGH
MCHC: 34.7
MCV: 86.3
Platelets: 288
RBC: 5.11
RDW: 13.6
WBC: 14.4 — ABNORMAL HIGH

## 2011-04-22 LAB — URINALYSIS, ROUTINE W REFLEX MICROSCOPIC
Bilirubin Urine: NEGATIVE
Glucose, UA: >1000 — AB
Hgb urine dipstick: NEGATIVE
Ketones, ur: 15 — AB
Leukocytes, UA: NEGATIVE
Nitrite: NEGATIVE
Protein, ur: NEGATIVE
Specific Gravity, Urine: 1.026
Urobilinogen, UA: 0.2
pH: 7

## 2011-04-22 LAB — POCT I-STAT CREATININE
Creatinine, Ser: 0.6
Creatinine, Ser: 0.7
Operator id: 116391
Operator id: 151321

## 2011-04-22 LAB — POCT URINALYSIS DIP (DEVICE)
Bilirubin Urine: NEGATIVE
Glucose, UA: >=1000 — AB
Hgb urine dipstick: NEGATIVE
Ketones, ur: NEGATIVE
Nitrite: NEGATIVE
Operator id: 235561
Protein, ur: NEGATIVE
Specific Gravity, Urine: 1.02
Urobilinogen, UA: 0.2
pH: 6

## 2011-04-22 LAB — I-STAT 8, (EC8 V) (CONVERTED LAB)
Acid-Base Excess: 1
Acid-Base Excess: 3 — ABNORMAL HIGH
BUN: 10
BUN: 13
Bicarbonate: 27 — ABNORMAL HIGH
Bicarbonate: 28.1 — ABNORMAL HIGH
Chloride: 101
Chloride: 99
Glucose, Bld: 294 — ABNORMAL HIGH
Glucose, Bld: 329 — ABNORMAL HIGH
HCT: 48 — ABNORMAL HIGH
HCT: 51 — ABNORMAL HIGH
Hemoglobin: 16.3 — ABNORMAL HIGH
Hemoglobin: 17.3 — ABNORMAL HIGH
Operator id: 116391
Operator id: 151321
Potassium: 4.1
Potassium: 4.3
Sodium: 134 — ABNORMAL LOW
Sodium: 134 — ABNORMAL LOW
TCO2: 28
TCO2: 29
pCO2, Ven: 44.2 — ABNORMAL LOW
pCO2, Ven: 48.4
pH, Ven: 7.354 — ABNORMAL HIGH
pH, Ven: 7.411 — ABNORMAL HIGH

## 2011-04-22 LAB — HEMOGLOBIN A1C
Hgb A1c MFr Bld: 11.6 — ABNORMAL HIGH
Mean Plasma Glucose: 336

## 2011-04-22 LAB — POCT PREGNANCY, URINE
Operator id: 151321
Preg Test, Ur: NEGATIVE

## 2011-04-22 LAB — URINE MICROSCOPIC-ADD ON

## 2011-06-15 ENCOUNTER — Ambulatory Visit (INDEPENDENT_AMBULATORY_CARE_PROVIDER_SITE_OTHER): Payer: Medicare Other | Admitting: Family Medicine

## 2011-06-15 ENCOUNTER — Encounter: Payer: Self-pay | Admitting: Family Medicine

## 2011-06-15 VITALS — BP 122/85 | HR 107 | Temp 97.0°F | Ht 64.0 in | Wt 189.0 lb

## 2011-06-15 DIAGNOSIS — K5909 Other constipation: Secondary | ICD-10-CM

## 2011-06-15 DIAGNOSIS — J069 Acute upper respiratory infection, unspecified: Secondary | ICD-10-CM

## 2011-06-15 DIAGNOSIS — Z23 Encounter for immunization: Secondary | ICD-10-CM

## 2011-06-15 DIAGNOSIS — E781 Pure hyperglyceridemia: Secondary | ICD-10-CM

## 2011-06-15 DIAGNOSIS — E1149 Type 2 diabetes mellitus with other diabetic neurological complication: Secondary | ICD-10-CM

## 2011-06-15 DIAGNOSIS — K5900 Constipation, unspecified: Secondary | ICD-10-CM

## 2011-06-15 LAB — POCT GLYCOSYLATED HEMOGLOBIN (HGB A1C): Hemoglobin A1C: 9.2

## 2011-06-15 MED ORDER — INSULIN GLARGINE 100 UNIT/ML ~~LOC~~ SOLN: 52.0000 [IU] | Freq: Two times a day (BID) | SUBCUTANEOUS | Status: DC

## 2011-06-15 MED ORDER — GABAPENTIN 300 MG PO CAPS: 300.0000 mg | ORAL_CAPSULE | Freq: Three times a day (TID) | ORAL | Status: DC

## 2011-06-15 MED ORDER — LACTULOSE 10 G PO PACK: 10.0000 g | PACK | Freq: Three times a day (TID) | ORAL | Status: AC

## 2011-06-15 MED ORDER — GUAIFENESIN 100 MG/5ML PO LIQD: 200.0000 mg | Freq: Three times a day (TID) | ORAL | Status: AC | PRN

## 2011-06-15 MED ORDER — PNEUMOCOCCAL VAC POLYVALENT 25 MCG/0.5ML IJ INJ: 0.5000 mL | INJECTION | Freq: Once | INTRAMUSCULAR | Status: DC

## 2011-06-15 MED ORDER — ALBUTEROL SULFATE HFA 108 (90 BASE) MCG/ACT IN AERS: 2.0000 | INHALATION_SPRAY | RESPIRATORY_TRACT | Status: DC | PRN

## 2011-06-15 NOTE — Assessment & Plan Note (Signed)
Increase fluid intake. Very dry stools. Likely secondary to diuresis from chronic DM, and GI neuropathy. She has tried colace and miralax without success.  Will trial lactulose as an osmotic diuretic and increase water intake.

## 2011-06-15 NOTE — Assessment & Plan Note (Signed)
Lipid Profile ordered for future fasting appointment.  C/w Crestor.

## 2011-06-15 NOTE — Progress Notes (Signed)
Addended by: Deno Etienne on: 06/15/2011 09:42 AM   Modules accepted: Orders

## 2011-06-15 NOTE — Assessment & Plan Note (Signed)
Bronchitis from current viral URI. Advised to quit smoking. Stay hydrated. Nsaids otc Robitussin.

## 2011-06-15 NOTE — Progress Notes (Signed)
  Subjective:    Patient ID: [name], female    DOB: 07/22/1970, 40 y.o.   MRN: [id number]  HPI 1. DM, uncontrolled with complications framingham risk score: 7% in 10 year risk (has strong fam hx of heart and vasc disease) HgA1c improved 9.2. Still poorly controlled with peripheral neuropathy and GI symptoms.  medication compliance: compliant most of the time, diabetic diet compliance: compliant most of the time, home glucose monitoring: is performed sporadically, further diabetic ROS: no chest pain, dyspnea or TIA's, has noted excessive thirstiness and frequent urination, weight has decreased, has dysesthesias in the feet, last eye exam: unknown.  Other symptoms and concerns: chronic constipation.  2. URI SUBJECTIVE:   congestion, dry cough and hoarseness for 4 days. She denies a history of chest pain, dizziness, nausea, vomiting and sputum production and has a history of asthma. Patient smokes cigarettes.  3. Chronic constipation Several years. Very hard stools. 2 weeks without stooling. Has no abdominal pain. She has no abdominal surgery. She has no bloating. She has tried miralax and colace without avail.   4. Smoking Cessation. Advised and counseled to quit smoking. Really enjoys smoking and currently has no desire to stop. She has asthma and bronchitis. framingham risk score: 7% in 10 year risk (has strong fam hx of heart and vasc disease)  5. Health maintenance Lipid panel needs to be obtained Flu done. Tdap and pneumovax done Schedule appointment for PAP next week.  Foot exam done. Optho referral placed.   Review of Systems No fever, no chills. Weight loss - decreased appetite recently. No rash, no arthralgias, no wounds.    Objective:   Physical Exam BP 122/85  Pulse 107  Temp 97 F (36.1 C)  Ht 5\' 4"  (1.626 m)  Wt 189 lb (85.73 kg)  BMI 32.44 kg/m2  LMP 06/13/2011 Appearance: alert, well appearing, and in no distress. Exam: fundi normal, heart sounds  normal rate, regular rhythm, normal S1, S2, no murmurs, rubs, clicks or gallops, no hepatosplenomegaly, no carotid bruits, feet: warm, good capillary refill, nail exam normal nails without lesions, normal DP and PT pulses and reduced sensation at toes. Lungs: scattered wheezes. No areas suggestive of consolidation vital signs are as noted. Ears normal.  Throat and pharynx normal.  Neck supple. No adenopathy in the neck. Nose is congested. Sinuses non tender.    Assessment & Plan:

## 2011-06-15 NOTE — Assessment & Plan Note (Signed)
Poorly controlled Diabetes with Hemoglobin >11 for many years. Referral to Endocrine for pump. Referral to optho for yearly eye exam. Foot exam performed today.

## 2011-06-15 NOTE — Patient Instructions (Signed)
It was great to see you today!  Schedule an appointment to see me in one week for PAP smear and lipid test.  Increase your water intake to one glass every 2 hours, I added lactulose to help you with your chronic constipation.  I increased your gabapentin for your feet neuropathy. This can cause drowsiness.  Stay hydrated, take ibuprofen and tylenol, robitussin OTC for your cold symptoms. Stop smoking, and you will have less bronchitis.  Your diabetes is uncontrolled and you have had a very poor Hemoglobin A1c for a long time. I will refer you to an endocrinologist for possible insulin pump.  You will also need to see an ophthalmologist for your diabetic eye disease.

## 2011-06-21 ENCOUNTER — Telehealth: Payer: Self-pay | Admitting: *Deleted

## 2011-06-21 NOTE — Telephone Encounter (Signed)
appt made with [facility] [address]  for 12.28.2012 @ 1100 am. Pt has an appt with Dr. Rivka Safer tomorrow and she will be given the appt time and date at that time.Laureen Ochs, Viann Shove

## 2011-06-22 ENCOUNTER — Ambulatory Visit
Admission: RE | Admit: 2011-06-22 | Discharge: 2011-06-22 | Disposition: A | Discharge status: Home / Self Care | Payer: Medicare Other | Source: Ambulatory Visit | Attending: Family Medicine | Admitting: Family Medicine

## 2011-06-22 ENCOUNTER — Encounter: Payer: Self-pay | Admitting: Family Medicine

## 2011-06-22 ENCOUNTER — Other Ambulatory Visit (HOSPITAL_COMMUNITY): Payer: Medicare Other

## 2011-06-22 ENCOUNTER — Other Ambulatory Visit: Payer: Self-pay | Admitting: Family Medicine

## 2011-06-22 ENCOUNTER — Ambulatory Visit (INDEPENDENT_AMBULATORY_CARE_PROVIDER_SITE_OTHER): Payer: Medicare Other | Admitting: Family Medicine

## 2011-06-22 ENCOUNTER — Ambulatory Visit (HOSPITAL_COMMUNITY)
Admission: RE | Admit: 2011-06-22 | Discharge: 2011-06-22 | Disposition: A | Discharge status: Home / Self Care | Payer: Managed Care, Other (non HMO) | Source: Ambulatory Visit | Attending: Family Medicine | Admitting: Family Medicine

## 2011-06-22 DIAGNOSIS — G459 Transient cerebral ischemic attack, unspecified: Secondary | ICD-10-CM

## 2011-06-22 DIAGNOSIS — R079 Chest pain, unspecified: Secondary | ICD-10-CM | POA: Insufficient documentation

## 2011-06-22 DIAGNOSIS — R531 Weakness: Secondary | ICD-10-CM | POA: Insufficient documentation

## 2011-06-22 DIAGNOSIS — M6281 Muscle weakness (generalized): Secondary | ICD-10-CM

## 2011-06-22 NOTE — Progress Notes (Addendum)
Subjective:    Patient ID: [name], female    DOB: 1971-04-21, 40 y.o.   MRN: [id number]  HPI 1. Chest pain Patient awoke abruptly from sleep at 3 am with crushing substernal chest pain with tingling in bilateral hands, and feeling of tingling radiating all over body with left hand weakness. Associated: nausea w/o vomiting, sweating, sense of doom, sob, dizziness, blurred vision. She checked her blood sugar and it was 240. She has no syncope, no abdominal pain, no back pain, no dysarthria/aphasia, no gait abnormality, no loc, no gasping for air. Now she has no chest pain, she still has left hand weakness and her vision she says is not back to normal. She has no sob/dizziness/prescyncope/chest pain. She is an uncontrolled diabetic.   2. Left sided weakness, Since her chest pain event at 3 am she has had left arm weakness with poor grip, abnormal gait with left leg weakness. She feels her vision is abnormal as well. Memory intact, speech intact.  3. ADDENDUM: 3:38 PM 06/22/2011 (new ddx: possible conversion disorder) Patient had a negative CT scan of the head done stat after seen this am. Patient was brought back to the clinic and examined by Dr. Jennette Kettle and myself. On exam patient had weakness on the left side upper ext. Her gait was abnormal, but did not fit with weakness, she kept a straight extended leg and semi-dragged it. We felt this to be a factitious finding.  Review of her previous complaints/imaging she is seen to have 6 prior CT scans of the head. Fur complaints of slurred speech, headache, left sided weakness. She has no history of any findings on those scans.  CT HEAD WITHOUT CONTRAST 06/22/2011 Technique: Contiguous axial images were obtained from the base of  the skull through the vertex without contrast.  Comparison: 03/19/2010.  Findings: BB subcutaneous region the left frontal area unchanged.  Visualized sinuses mastoid air cells and middle ear cavities are  clear.  No  intracranial hemorrhage.  No CT evidence of large acute infarct. Small acute infarct cannot  be excluded by CT.  No intracranial mass lesion detected on this unenhanced exam.  IMPRESSION:  No intracranial hemorrhage or CT evidence of large acute infarct.  CT HEAD WITHOUT CONTRAST 03/19/10 IMPRESSION:  No intracranial hemorrhage or CT evidence of large acute infarct.  Metallic structure (BB) subcutaneous region left frontal area  causes streak artifact.  CT HEAD WITHOUT CONTRAST 10/20/08 IMPRESSION:  Negative for bleed or other acute intracranial process.  CT HEAD WITHOUT CONTRAST  10/19/10 IMPRESSION:  Negative for acute intracranial hemorrhage, acute infarction or  mass lesions.   CT HEAD WITHOUT CONTRAST 07/25/08 IMPRESSION:  No acute intracranial abnormality.  HEAD CT WITHOUT CONTRAST:  02/24/07 IMPRESSION:  No evidence for acute intracranial abnormality.   Lab Results  Component Value Date   HGBA1C 9.2 06/15/2011  Framingham 10 year risk: 5%. Risk of MI ABCD2 score: 5 (moderate risk for TIA) She is still smoking.  Review of Systems No fever, no sour brash, no chills. No syncope.    Objective:   Physical Exam Filed Vitals:   06/22/11 0929  BP: 135/92  Pulse: 103  Temp: 97.6 F (36.4 C)  TempSrc: Oral  Height: 5\' 4"  (1.626 m)  Weight: 193 lb (87.544 kg)  General appearance: alert, cooperative, no distress and mildly obese Head: Normocephalic, without obvious abnormality, atraumatic Eyes: conjunctivae/corneas clear. PERRL, EOM's intact. Fundi benign. Neck: no adenopathy, no carotid bruit, no JVD, supple, symmetrical, trachea midline and thyroid  not enlarged, symmetric, no tenderness/mass/nodules Back: symmetric, no curvature. ROM normal. No CVA tenderness. Lungs: clear to auscultation bilaterally Heart: regular rate and rhythm, S1, S2 normal, no murmur, click, rub or gallop Extremities: extremities normal, atraumatic, no cyanosis or edema Pulses: 2+ and  symmetric Neurologic exam : Cn 2-7 intact Strength decreased left upper ext/left lower ext. Gait: dragging of left foot. Balance normal  Romberg normal, finger to nose   EKG: normal ekg. No s/t wave abnormalities. Normal P waves.    Assessment & Plan:

## 2011-06-22 NOTE — Telephone Encounter (Signed)
Patient says she will return to clinic.

## 2011-06-22 NOTE — Assessment & Plan Note (Signed)
Possible TIA vs. Stroke in this Diabetic smoker. Will obtain STAT CT of the brain.  After reviewing the results will decide on admission.

## 2011-06-22 NOTE — Assessment & Plan Note (Signed)
No chest pain at this time. Normal EKG Worry for silent MI  Main complaint is left sided weakness now.

## 2011-06-22 NOTE — Patient Instructions (Signed)
Go directly to [facility] for your CT of the head. I will review the results and contact you by phone if I need you to be admitted.

## 2011-06-22 NOTE — Assessment & Plan Note (Signed)
ABCD2 score: 5 (moderate risk for TIA) STAT CT of the head ordered.

## 2011-06-23 ENCOUNTER — Ambulatory Visit: Payer: Medicare Other | Admitting: Family Medicine

## 2011-07-08 ENCOUNTER — Telehealth: Payer: Self-pay | Admitting: Family Medicine

## 2011-07-08 NOTE — Telephone Encounter (Signed)
[phone] is the fax # for Dr. Lucious Groves office.  The patient is there now and they need notes pertaining to the referral.

## 2011-07-08 NOTE — Telephone Encounter (Signed)
Spoke with Diane ------Faxed over notes and referral

## 2011-08-05 DIAGNOSIS — E785 Hyperlipidemia, unspecified: Secondary | ICD-10-CM | POA: Diagnosis not present

## 2011-08-05 DIAGNOSIS — E1149 Type 2 diabetes mellitus with other diabetic neurological complication: Secondary | ICD-10-CM | POA: Diagnosis not present

## 2011-08-05 DIAGNOSIS — E669 Obesity, unspecified: Secondary | ICD-10-CM | POA: Diagnosis not present

## 2011-08-05 DIAGNOSIS — F17200 Nicotine dependence, unspecified, uncomplicated: Secondary | ICD-10-CM | POA: Diagnosis not present

## 2011-08-05 DIAGNOSIS — R809 Proteinuria, unspecified: Secondary | ICD-10-CM | POA: Diagnosis not present

## 2011-08-05 DIAGNOSIS — E1142 Type 2 diabetes mellitus with diabetic polyneuropathy: Secondary | ICD-10-CM | POA: Diagnosis not present

## 2011-09-07 DIAGNOSIS — E1149 Type 2 diabetes mellitus with other diabetic neurological complication: Secondary | ICD-10-CM | POA: Diagnosis not present

## 2011-09-07 DIAGNOSIS — N189 Chronic kidney disease, unspecified: Secondary | ICD-10-CM | POA: Diagnosis not present

## 2011-09-07 DIAGNOSIS — E1129 Type 2 diabetes mellitus with other diabetic kidney complication: Secondary | ICD-10-CM | POA: Diagnosis not present

## 2011-09-07 DIAGNOSIS — F17200 Nicotine dependence, unspecified, uncomplicated: Secondary | ICD-10-CM | POA: Diagnosis not present

## 2011-09-07 DIAGNOSIS — E669 Obesity, unspecified: Secondary | ICD-10-CM | POA: Diagnosis not present

## 2011-09-07 DIAGNOSIS — E1165 Type 2 diabetes mellitus with hyperglycemia: Secondary | ICD-10-CM | POA: Diagnosis not present

## 2011-09-07 DIAGNOSIS — E785 Hyperlipidemia, unspecified: Secondary | ICD-10-CM | POA: Diagnosis not present

## 2011-09-07 DIAGNOSIS — E1142 Type 2 diabetes mellitus with diabetic polyneuropathy: Secondary | ICD-10-CM | POA: Diagnosis not present

## 2011-09-12 DIAGNOSIS — F3131 Bipolar disorder, current episode depressed, mild: Secondary | ICD-10-CM | POA: Diagnosis not present

## 2011-09-14 DIAGNOSIS — IMO0001 Reserved for inherently not codable concepts without codable children: Secondary | ICD-10-CM | POA: Diagnosis not present

## 2011-10-08 ENCOUNTER — Other Ambulatory Visit: Payer: Self-pay

## 2011-10-08 ENCOUNTER — Emergency Department (HOSPITAL_COMMUNITY): Payer: Managed Care, Other (non HMO)

## 2011-10-08 ENCOUNTER — Observation Stay (HOSPITAL_COMMUNITY)
Admission: EM | Admit: 2011-10-08 | Discharge: 2011-10-09 | Disposition: A | Discharge status: Home / Self Care | Payer: Managed Care, Other (non HMO) | Attending: Family Medicine | Admitting: Family Medicine

## 2011-10-08 ENCOUNTER — Encounter (HOSPITAL_COMMUNITY): Payer: Self-pay | Admitting: Emergency Medicine

## 2011-10-08 DIAGNOSIS — G9341 Metabolic encephalopathy: Secondary | ICD-10-CM | POA: Diagnosis present

## 2011-10-08 DIAGNOSIS — E1142 Type 2 diabetes mellitus with diabetic polyneuropathy: Secondary | ICD-10-CM | POA: Insufficient documentation

## 2011-10-08 DIAGNOSIS — I498 Other specified cardiac arrhythmias: Secondary | ICD-10-CM | POA: Insufficient documentation

## 2011-10-08 DIAGNOSIS — R000 Tachycardia, unspecified: Secondary | ICD-10-CM

## 2011-10-08 DIAGNOSIS — R569 Unspecified convulsions: Secondary | ICD-10-CM

## 2011-10-08 DIAGNOSIS — G40909 Epilepsy, unspecified, not intractable, without status epilepticus: Secondary | ICD-10-CM | POA: Diagnosis present

## 2011-10-08 DIAGNOSIS — IMO0002 Reserved for concepts with insufficient information to code with codable children: Secondary | ICD-10-CM | POA: Diagnosis not present

## 2011-10-08 DIAGNOSIS — M542 Cervicalgia: Secondary | ICD-10-CM | POA: Diagnosis not present

## 2011-10-08 DIAGNOSIS — R071 Chest pain on breathing: Secondary | ICD-10-CM | POA: Insufficient documentation

## 2011-10-08 DIAGNOSIS — R55 Syncope and collapse: Secondary | ICD-10-CM

## 2011-10-08 DIAGNOSIS — R0789 Other chest pain: Secondary | ICD-10-CM

## 2011-10-08 DIAGNOSIS — R109 Unspecified abdominal pain: Secondary | ICD-10-CM

## 2011-10-08 DIAGNOSIS — R51 Headache: Secondary | ICD-10-CM

## 2011-10-08 DIAGNOSIS — F17200 Nicotine dependence, unspecified, uncomplicated: Secondary | ICD-10-CM

## 2011-10-08 DIAGNOSIS — R42 Dizziness and giddiness: Secondary | ICD-10-CM | POA: Insufficient documentation

## 2011-10-08 DIAGNOSIS — J449 Chronic obstructive pulmonary disease, unspecified: Secondary | ICD-10-CM | POA: Insufficient documentation

## 2011-10-08 DIAGNOSIS — F4320 Adjustment disorder, unspecified: Secondary | ICD-10-CM | POA: Insufficient documentation

## 2011-10-08 DIAGNOSIS — J4489 Other specified chronic obstructive pulmonary disease: Secondary | ICD-10-CM | POA: Insufficient documentation

## 2011-10-08 DIAGNOSIS — I10 Essential (primary) hypertension: Secondary | ICD-10-CM | POA: Insufficient documentation

## 2011-10-08 DIAGNOSIS — E1149 Type 2 diabetes mellitus with other diabetic neurological complication: Secondary | ICD-10-CM | POA: Insufficient documentation

## 2011-10-08 DIAGNOSIS — F319 Bipolar disorder, unspecified: Secondary | ICD-10-CM | POA: Diagnosis present

## 2011-10-08 DIAGNOSIS — R404 Transient alteration of awareness: Secondary | ICD-10-CM | POA: Diagnosis not present

## 2011-10-08 DIAGNOSIS — W19XXXA Unspecified fall, initial encounter: Secondary | ICD-10-CM | POA: Insufficient documentation

## 2011-10-08 DIAGNOSIS — G9340 Encephalopathy, unspecified: Secondary | ICD-10-CM | POA: Diagnosis present

## 2011-10-08 HISTORY — DX: Unspecified convulsions: R56.9

## 2011-10-08 HISTORY — DX: Essential (primary) hypertension: I10

## 2011-10-08 HISTORY — DX: Chronic obstructive pulmonary disease, unspecified: J44.9

## 2011-10-08 LAB — CREATININE, SERUM
Creatinine, Ser: 0.61 mg/dL (ref 0.50–1.10)
GFR calc Af Amer: >90 mL/min
GFR calc non Af Amer: >90 mL/min

## 2011-10-08 LAB — URINALYSIS, ROUTINE W REFLEX MICROSCOPIC
Bilirubin Urine: NEGATIVE
Glucose, UA: >1000 mg/dL — AB
Hgb urine dipstick: NEGATIVE
Ketones, ur: 15 mg/dL — AB
Leukocytes, UA: NEGATIVE
Nitrite: NEGATIVE
Protein, ur: NEGATIVE mg/dL
Specific Gravity, Urine: 1.016 (ref 1.005–1.030)
Urobilinogen, UA: 0.2 mg/dL (ref 0.0–1.0)
pH: 6 (ref 5.0–8.0)

## 2011-10-08 LAB — CARDIAC PANEL(CRET KIN+CKTOT+MB+TROPI)
CK, MB: 1.7 ng/mL (ref 0.3–4.0)
Relative Index: INVALID (ref 0.0–2.5)
Total CK: 90 U/L (ref 7–177)
Troponin I: <0.3 ng/mL (ref <0.30)

## 2011-10-08 LAB — CBC
HCT: 39.1 % (ref 36.0–46.0)
HCT: 36.8 % (ref 36.0–46.0)
Hemoglobin: 13.5 g/dL (ref 12.0–15.0)
Hemoglobin: 12.9 g/dL (ref 12.0–15.0)
MCH: 29.8 pg (ref 26.0–34.0)
MCH: 30.2 pg (ref 26.0–34.0)
MCHC: 34.5 g/dL (ref 30.0–36.0)
MCHC: 35.1 g/dL (ref 30.0–36.0)
MCV: 86.3 fL (ref 78.0–100.0)
MCV: 86.2 fL (ref 78.0–100.0)
Platelets: 269 10*3/uL (ref 150–400)
Platelets: 258 10*3/uL (ref 150–400)
RBC: 4.53 MIL/uL (ref 3.87–5.11)
RBC: 4.27 MIL/uL (ref 3.87–5.11)
RDW: 13.5 % (ref 11.5–15.5)
RDW: 13.5 % (ref 11.5–15.5)
WBC: 12.7 10*3/uL — ABNORMAL HIGH (ref 4.0–10.5)
WBC: 12.1 10*3/uL — ABNORMAL HIGH (ref 4.0–10.5)

## 2011-10-08 LAB — POCT I-STAT TROPONIN I: Troponin i, poc: 0 ng/mL (ref 0.00–0.08)

## 2011-10-08 LAB — RAPID URINE DRUG SCREEN, HOSP PERFORMED
Amphetamines: NOT DETECTED
Barbiturates: NOT DETECTED
Benzodiazepines: NOT DETECTED
Cocaine: NOT DETECTED
Opiates: NOT DETECTED
Tetrahydrocannabinol: POSITIVE — AB

## 2011-10-08 LAB — DIFFERENTIAL
Basophils Absolute: 0 10*3/uL (ref 0.0–0.1)
Basophils Relative: 0 % (ref 0–1)
Eosinophils Absolute: 0.3 10*3/uL (ref 0.0–0.7)
Eosinophils Relative: 2 % (ref 0–5)
Lymphocytes Relative: 23 % (ref 12–46)
Lymphs Abs: 3 10*3/uL (ref 0.7–4.0)
Monocytes Absolute: 0.7 10*3/uL (ref 0.1–1.0)
Monocytes Relative: 5 % (ref 3–12)
Neutro Abs: 8.8 10*3/uL — ABNORMAL HIGH (ref 1.7–7.7)
Neutrophils Relative %: 69 % (ref 43–77)

## 2011-10-08 LAB — PREGNANCY, URINE: Preg Test, Ur: NEGATIVE

## 2011-10-08 LAB — POCT I-STAT, CHEM 8
BUN: 10 mg/dL (ref 6–23)
Calcium, Ion: 1.18 mmol/L (ref 1.12–1.32)
Chloride: 102 mEq/L (ref 96–112)
Creatinine, Ser: 0.7 mg/dL (ref 0.50–1.10)
Glucose, Bld: 365 mg/dL — ABNORMAL HIGH (ref 70–99)
HCT: 40 % (ref 36.0–46.0)
Hemoglobin: 13.6 g/dL (ref 12.0–15.0)
Potassium: 4 mEq/L (ref 3.5–5.1)
Sodium: 134 mEq/L — ABNORMAL LOW (ref 135–145)
TCO2: 23 mmol/L (ref 0–100)

## 2011-10-08 LAB — D-DIMER, QUANTITATIVE: D-Dimer, Quant: 0.42 ug/mL-FEU (ref 0.00–0.48)

## 2011-10-08 LAB — URINE MICROSCOPIC-ADD ON

## 2011-10-08 LAB — GLUCOSE, CAPILLARY: Glucose-Capillary: 261 mg/dL — ABNORMAL HIGH (ref 70–99)

## 2011-10-08 MED ORDER — HYDROMORPHONE HCL PF 1 MG/ML IJ SOLN
1.0000 mg | Freq: Once | INTRAMUSCULAR | Status: AC
  Administered 2011-10-08: 1 mg via INTRAVENOUS
  Filled 2011-10-08: qty 1

## 2011-10-08 MED ORDER — SODIUM CHLORIDE 0.9 % IV BOLUS (SEPSIS)
1000.0000 mL | Freq: Once | INTRAVENOUS | Status: AC
  Administered 2011-10-08: 1000 mL via INTRAVENOUS

## 2011-10-08 MED ORDER — ASPIRIN EC 325 MG PO TBEC: 325.0000 mg | DELAYED_RELEASE_TABLET | Freq: Every day | ORAL | Status: DC

## 2011-10-08 MED ORDER — SODIUM CHLORIDE 0.9 % IV SOLN
INTRAVENOUS | Status: DC
  Administered 2011-10-09: 02:00:00 via INTRAVENOUS

## 2011-10-08 MED ORDER — ACETAMINOPHEN 325 MG PO TABS
650.0000 mg | ORAL_TABLET | Freq: Four times a day (QID) | ORAL | Status: DC | PRN
  Administered 2011-10-09 (×2): 650 mg via ORAL
  Filled 2011-10-08 (×3): qty 2

## 2011-10-08 MED ORDER — ACETAMINOPHEN 650 MG RE SUPP: 650.0000 mg | Freq: Four times a day (QID) | RECTAL | Status: DC | PRN

## 2011-10-08 MED ORDER — NICOTINE 14 MG/24HR TD PT24
14.0000 mg | MEDICATED_PATCH | Freq: Once | TRANSDERMAL | Status: DC
  Administered 2011-10-08: 14 mg via TRANSDERMAL
  Filled 2011-10-08: qty 1

## 2011-10-08 MED ORDER — ASPIRIN 325 MG PO TABS
325.0000 mg | ORAL_TABLET | Freq: Once | ORAL | Status: AC
  Administered 2011-10-09: 325 mg via ORAL
  Filled 2011-10-08: qty 1

## 2011-10-08 MED ORDER — HEPARIN SODIUM (PORCINE) 5000 UNIT/ML IJ SOLN
5000.0000 [IU] | Freq: Three times a day (TID) | INTRAMUSCULAR | Status: DC
  Administered 2011-10-09: 5000 [IU] via SUBCUTANEOUS
  Filled 2011-10-08 (×4): qty 1

## 2011-10-08 MED ORDER — SODIUM CHLORIDE 0.9 % IJ SOLN: 3.0000 mL | Freq: Two times a day (BID) | INTRAMUSCULAR | Status: DC

## 2011-10-08 MED ORDER — TRAMADOL HCL 50 MG PO TABS
50.0000 mg | ORAL_TABLET | Freq: Two times a day (BID) | ORAL | Status: DC
  Administered 2011-10-09: 50 mg via ORAL
  Filled 2011-10-08: qty 1

## 2011-10-08 NOTE — ED Notes (Signed)
C-collar removed by Dr Brooke Dare.  Pt states pain to L chest and L parietal continues to be 8/10.  AO x 4.  Pt joking around with family.

## 2011-10-08 NOTE — ED Notes (Signed)
Stood pt up to do a standing bp, pt's feet slid out from under her and pt fell back on bed. Did not attempt to stand pt back up.

## 2011-10-08 NOTE — ED Provider Notes (Signed)
History     CSN: [id number]  Arrival date & time 10/08/11  1711   First MD Initiated Contact with Patient 10/08/11 1720      Chief Complaint  Patient presents with  . Fall    (Consider location/radiation/quality/duration/timing/severity/associated sxs/prior treatment) HPI Comments: 41 yo female presents after syncopizing in her bathroom. Was turning around to take off her pants to sit down and abruptly felt lightheaded and then passed out. Woke up after a few min (<5 per boyfriends estimation) in the bathtub with her right sided hurting (right head, right chest, right hip/flank). Has hx of sycnope in the past, as well as seizures. Says she was alert right after awakening and does not feel like she had a seizure. No B/B incontinence or tongue biting. No dyspnea or chest pain prior to the syncope.  Patient is a 41 y.o. female presenting with syncope. The history is provided by the patient.  Loss of Consciousness This is a new problem. The current episode started today. Episode frequency: once. The problem has been resolved. Associated symptoms include chest pain and headaches. Pertinent negatives include no abdominal pain, chills, congestion, fever, nausea, neck pain, rash, urinary symptoms, vomiting or weakness. The symptoms are aggravated by nothing. She has tried nothing for the symptoms.    Past Medical History  Diagnosis Date  . Asthma   . COPD (chronic obstructive pulmonary disease)   . Diabetes mellitus   . Renal disorder   . Hypertension   . Seizures     No past surgical history on file.  No family history on file.  History  Substance Use Topics  . Smoking status: Current Everyday Smoker -- 1.0 packs/day for 20 years    Types: Cigarettes  . Smokeless tobacco: Not on file  . Alcohol Use: Not on file    OB History    Grav Para Term Preterm Abortions TAB SAB Ect Mult Living                  Review of Systems  Constitutional: Negative for fever and chills.  HENT:  Negative for congestion, rhinorrhea and neck pain.   Respiratory: Negative for shortness of breath.   Cardiovascular: Positive for chest pain and syncope. Negative for leg swelling.  Gastrointestinal: Negative for nausea, vomiting, abdominal pain and constipation.  Genitourinary: Positive for flank pain. Negative for urgency, decreased urine volume and difficulty urinating.  Musculoskeletal:       Right hip pain  Skin: Negative for rash and wound.  Neurological: Positive for syncope, light-headedness and headaches. Negative for weakness.  Psychiatric/Behavioral: Negative for confusion.  All other systems reviewed and are negative.    Allergies  Montelukast sodium; Morphine sulfate; and Penicillins  Home Medications   Current Outpatient Rx  Name Route Sig Dispense Refill  . ALBUTEROL SULFATE HFA 108 (90 BASE) MCG/ACT IN AERS Inhalation Inhale 2 puffs into the lungs every 4 (four) hours as needed. For shortness of breath    . ALBUTEROL SULFATE (2.5 MG/3ML) 0.083% IN NEBU Nebulization Take 2.5 mg by nebulization daily.      . ASPIRIN 81 MG PO TBEC Oral Take 81 mg by mouth daily.      Marland Kitchen CLONAZEPAM 0.5 MG PO TABS Oral Take 0.5 mg by mouth 3 (three) times daily.     Marland Kitchen FLUOXETINE HCL 40 MG PO CAPS Oral Take 40 mg by mouth daily.     Marland Kitchen FLUTICASONE-SALMETEROL 500-50 MCG/DOSE IN AEPB Inhalation Inhale 1 puff into the lungs 2 (  two) times daily.     Marland Kitchen GABAPENTIN 300 MG PO CAPS Oral Take 300 mg by mouth 3 (three) times daily.    . INSULIN ISOPHANE & REGULAR (70-30) 100 UNIT/ML Rogers SUSP Subcutaneous Inject 80 Units into the skin 2 (two) times daily with a meal.    . LAMOTRIGINE 200 MG PO TABS Oral Take 200 mg by mouth at bedtime.    Marland Kitchen LISINOPRIL 10 MG PO TABS Oral Take 10 mg by mouth daily.    . QUETIAPINE FUMARATE ER 400 MG PO TB24 Oral Take 800 mg by mouth at bedtime.    Marland Kitchen ROSUVASTATIN CALCIUM 20 MG PO TABS Oral Take 20 mg by mouth daily.      . TRAMADOL HCL 50 MG PO TABS Oral Take 50 mg by mouth  2 (two) times daily.      BP 130/93  Pulse 120  Temp(Src) 98.2 F (36.8 C) (Oral)  Resp 16  SpO2 99%  Physical Exam  Nursing note and vitals reviewed. Constitutional: She is oriented to person, place, and time. She appears well-developed and well-nourished. No distress.  HENT:  Head: Normocephalic and atraumatic.    Right Ear: External ear normal.  Left Ear: External ear normal.  Nose: Nose normal.  Mouth/Throat: Oropharynx is clear and moist.  Eyes: EOM are normal. Pupils are equal, round, and reactive to light.  Neck: Neck supple.  Cardiovascular: Regular rhythm, normal heart sounds and intact distal pulses.  Tachycardia present.   Pulmonary/Chest: Effort normal and breath sounds normal. No respiratory distress. She has no wheezes. She has no rales.    Abdominal: Soft. She exhibits no distension. There is no tenderness.  Musculoskeletal: She exhibits no edema.       Cervical back: She exhibits no tenderness.       Thoracic back: She exhibits no tenderness.       Lumbar back: She exhibits no tenderness.       Legs: Lymphadenopathy:    She has no cervical adenopathy.  Neurological: She is alert and oriented to person, place, and time. She has normal strength. No cranial nerve deficit or sensory deficit. GCS eye subscore is 4. GCS verbal subscore is 5. GCS motor subscore is 6.  Skin: Skin is warm and dry. She is not diaphoretic. No pallor.    ED Course  Procedures (including critical care time)  Labs Reviewed  CBC - Abnormal; Notable for the following:    WBC 12.7 (*)    All other components within normal limits  DIFFERENTIAL - Abnormal; Notable for the following:    Neutro Abs 8.8 (*)    All other components within normal limits  URINALYSIS, ROUTINE W REFLEX MICROSCOPIC - Abnormal; Notable for the following:    Glucose, UA >1000 (*)    Ketones, ur 15 (*)    All other components within normal limits  POCT I-STAT, CHEM 8 - Abnormal; Notable for the following:     Sodium 134 (*)    Glucose, Bld 365 (*)    All other components within normal limits  URINE MICROSCOPIC-ADD ON - Abnormal; Notable for the following:    Squamous Epithelial / LPF FEW (*)    All other components within normal limits  GLUCOSE, CAPILLARY - Abnormal; Notable for the following:    Glucose-Capillary 261 (*)    All other components within normal limits  URINE RAPID DRUG SCREEN (HOSP PERFORMED) - Abnormal; Notable for the following:    Tetrahydrocannabinol POSITIVE (*)    All  other components within normal limits  CBC - Abnormal; Notable for the following:    WBC 12.1 (*)    All other components within normal limits  PREGNANCY, URINE  POCT I-STAT TROPONIN I  D-DIMER, QUANTITATIVE  CREATININE, SERUM  URINE RAPID DRUG SCREEN (HOSP PERFORMED)  CARDIAC PANEL(CRET KIN+CKTOT+MB+TROPI)  HEMOGLOBIN A1C  BASIC METABOLIC PANEL  CBC  TSH   Dg Chest 2 View  10/08/2011  *RADIOLOGY REPORT*  Clinical Data: Fall with loss of consciousness  CHEST - 2 VIEW  Comparison: 03/19/2010  Findings: The heart size and mediastinal contours are within normal limits.  Both lungs are clear. Mild multilevel spondylosis within the thoracic spine.  IMPRESSION:  No acute findings.  Original Report Authenticated By: Rosealee Albee, M.D.   Dg Pelvis 1-2 Views  10/08/2011  *RADIOLOGY REPORT*  Clinical Data: Fall with loss of consciousness.  PELVIS - 1-2 VIEW  Comparison: None.  Findings: There is no evidence of fracture or dislocation.  There is no evidence of arthropathy or other focal bone abnormality. Soft tissues are unremarkable.  IMPRESSION: Negative examination.  Original Report Authenticated By: Rosealee Albee, M.D.   Ct Head Wo Contrast  10/08/2011  *RADIOLOGY REPORT*  Clinical Data:  41 year old female status post fall.  Dizziness, syncope.  Pain.  CT HEAD WITHOUT CONTRAST CT CERVICAL SPINE WITHOUT CONTRAST  Technique:  Multidetector CT imaging of the head and cervical spine was performed following  the standard protocol without intravenous contrast.  Multiplanar CT image reconstructions of the cervical spine were also generated.  Comparison:  Head CTs 06/22/2011 and earlier.  CT HEAD  Findings: Chronic retained metal foreign body along the left lateral convexity.  No scalp hematoma. Visualized orbit soft tissues are within normal limits.  Visualized paranasal sinuses and mastoids are clear.  No acute osseous abnormality identified.  Cerebral volume is within normal limits for age.  No midline shift, ventriculomegaly, mass effect, evidence of mass lesion, intracranial hemorrhage or evidence of cortically based acute infarction.  Gray-white matter differentiation is within normal limits throughout the brain.  Dominant left vertebral artery suspected. No suspicious intracranial vascular hyperdensity.  IMPRESSION: Stable and normal noncontrast CT appearance of the brain. Cervical findings are below.  CT CERVICAL SPINE  Findings: Straightening of cervical lordosis. Visualized skull base is intact.  No atlanto-occipital dissociation.  Cervicothoracic junction alignment is within normal limits.  Bilateral posterior element alignment is within normal limits.  Degenerative changes at the inferior C1 ring anteriorly related to the longus colli muscles.  No acute cervical fracture identified.  Grossly negative lung apices. Visualized paraspinal soft tissues are within normal limits.  IMPRESSION: No acute fracture or listhesis identified in the cervical spine. Ligamentous injury is not excluded.  Original Report Authenticated By: Harley Hallmark, M.D.   Ct Cervical Spine Wo Contrast  10/08/2011  *RADIOLOGY REPORT*  Clinical Data:  41 year old female status post fall.  Dizziness, syncope.  Pain.  CT HEAD WITHOUT CONTRAST CT CERVICAL SPINE WITHOUT CONTRAST  Technique:  Multidetector CT imaging of the head and cervical spine was performed following the standard protocol without intravenous contrast.  Multiplanar CT image  reconstructions of the cervical spine were also generated.  Comparison:  Head CTs 06/22/2011 and earlier.  CT HEAD  Findings: Chronic retained metal foreign body along the left lateral convexity.  No scalp hematoma. Visualized orbit soft tissues are within normal limits.  Visualized paranasal sinuses and mastoids are clear.  No acute osseous abnormality identified.  Cerebral volume is within  normal limits for age.  No midline shift, ventriculomegaly, mass effect, evidence of mass lesion, intracranial hemorrhage or evidence of cortically based acute infarction.  Gray-white matter differentiation is within normal limits throughout the brain.  Dominant left vertebral artery suspected. No suspicious intracranial vascular hyperdensity.  IMPRESSION: Stable and normal noncontrast CT appearance of the brain. Cervical findings are below.  CT CERVICAL SPINE  Findings: Straightening of cervical lordosis. Visualized skull base is intact.  No atlanto-occipital dissociation.  Cervicothoracic junction alignment is within normal limits.  Bilateral posterior element alignment is within normal limits.  Degenerative changes at the inferior C1 ring anteriorly related to the longus colli muscles.  No acute cervical fracture identified.  Grossly negative lung apices. Visualized paraspinal soft tissues are within normal limits.  IMPRESSION: No acute fracture or listhesis identified in the cervical spine. Ligamentous injury is not excluded.  Original Report Authenticated By: Harley Hallmark, M.D.     Date: 10/08/2011  Rate: 125  Rhythm: sinus tachycardia  QRS Axis: normal  Intervals: normal  ST/T Wave abnormalities: normal  Conduction Disutrbances:none  Narrative Interpretation:   Old EKG Reviewed: unchanged   1. Syncope   2. Chest wall pain   3. Right flank pain   4. Headache   5. Sinus tachycardia       MDM  41 yo female who had syncopal episode vs seizure in the bathroom. Felt lightheaded prior to fall, but no  dyspnea or CP prior to syncope. Hurts on right side of body, chest pain c/w MSK (neg trop and EKG). CT head/neck and CXR and pelvis Xray all negative. Sinus tachy here, minimal change with fluids and pain control. Ddimer negative, PE seems unlikely. Possibly related to dehydration, will continue to hydrate. However due to persistent tachycardia without source, will admit to family practice. No hypotension.        Pricilla Loveless, MD 10/08/11 [id number]

## 2011-10-08 NOTE — ED Notes (Signed)
Patient transported to X-ray 

## 2011-10-08 NOTE — ED Notes (Signed)
Pt states pain decreased to 5/10, however hr continues to be elevated.  2nd L infusing.

## 2011-10-08 NOTE — ED Notes (Signed)
Awake A&0 times 4,

## 2011-10-08 NOTE — H&P (Signed)
Family Medicine Teaching Service HISTORY & PHYSICAL   Patient name: [name] Medical record number: [id number] Date of birth: May 18, 1971 Age: 41 y.o. Gender: female  Primary Care Provider: Edd Arbour, MD, MD   Chief Complaint: Sycope    HPI  [name] is a 41 y.o. year old female presenting with acute onset of sycope earlier this evening.  She was walking into her bathroom to have a bowel movement and sycopizied without aura and was found in the empty bathtub.  She reports a history of seizure disorder but this was different than her typical seizures as she was not post ictal when awakening.  She reports no prior episodes similar to this.  She was otherwise feeling well prior to this episode.  Upon evaluation in the ED she was found to have no fractures although reporting pain on her R side as this is the side that she fell on.  CT of the head and neck was negative as were x-rays of the hip.  She was persistently tachycardic while in the ED in spite of fluid resuscitation and FMTS was consulted to observe the patient overnight.    Pt reports prior blood clot and positive family history.    ROS   Constitutional No changes in energy level, otherwise feeling well prior to episode  Infectious No recent fevers, chills, or sick contacts  Resp Chronic cough and congestion with COPD/Asthma - 2L [place] at home PRN and qhs  Cardiac No chest pain, no palpitations  GI No vomiting, constipation or diarrhea.  Nausea with Seroquel and no meds but no prodromal nausea  GU Polyuria, no dysuria,   Psych Mood disorder; unchanged  Neuro Most recent seizure in December 2012.  Self manages at home typically  MSK Chronic low back pain and leg pain due to diabetic neuropathy  Trauma Fall as above; no prior falls  MEDS  reports compliance but often self-titrates qhs 70/30 does               HISTORY:  PMHx:  Past Medical History  Diagnosis Date  . Asthma   . COPD (chronic obstructive pulmonary  disease)   . Diabetes mellitus   . Renal disorder   . Hypertension   . Seizures     PSHx: Past Surgical History  Procedure Date  . Laparoscopy     Endometriosis    Social Hx: History   Social History  . Marital Status: Married    Spouse Name: N/A    Number of Children: N/A  . Years of Education: N/A   Social History Main Topics  . Smoking status: Current Everyday Smoker -- 1.0 packs/day for 20 years    Types: Cigarettes  . Smokeless tobacco: Not on file  . Alcohol Use: No  . Drug Use: No  . Sexually Active: Yes   Other Topics Concern  . Not on file   Social History Narrative   Lives in [place] but lives with Boyfriend - not legally separatedDisabled - for BiPolar, Seizure disorder, diabetesFormerly worked at [employer]    Substance Hx: History  Substance Use Topics  . Smoking status: Current Everyday Smoker -- 1.0 packs/day for 20 years    Types: Cigarettes  . Smokeless tobacco: Not on file  . Alcohol Use: No    Family Hx: Family History  Problem Relation Age of Onset  . Venous thrombosis Brother   . Asthma Father   . Diabetes Father   . Coronary artery disease Mother   . Hypertension  Mother   . Diabetes Mother   . Asthma Sister     Allergies: Allergies  Allergen Reactions  . Montelukast Sodium     REACTION: swollen throat  . Morphine Sulfate     REACTION: Swollen Throat - Able to tolerate dilaudid  . Penicillins     REACTION: hives, breathing problems, swelling on throat    Home Medications: Prior to Admission medications   Medication Sig Start Date End Date Taking? Authorizing Provider  albuterol (PROVENTIL HFA;VENTOLIN HFA) 108 (90 BASE) MCG/ACT inhaler Inhale 2 puffs into the lungs every 4 (four) hours as needed. For shortness of breath   Yes Edd Arbour, MD  albuterol (PROVENTIL) (2.5 MG/3ML) 0.083% nebulizer solution Take 2.5 mg by nebulization daily.     Yes Historical Provider, MD  aspirin (ANACIN) 81 MG EC tablet Take 81 mg by  mouth daily.     Yes Historical Provider, MD  clonazePAM (KLONOPIN) 0.5 MG tablet Take 0.5 mg by mouth 3 (three) times daily.    Yes Historical Provider, MD  FLUoxetine (PROZAC) 40 MG capsule Take 40 mg by mouth daily.    Yes Historical Provider, MD  Fluticasone-Salmeterol (ADVAIR DISKUS) 500-50 MCG/DOSE AEPB Inhale 1 puff into the lungs 2 (two) times daily.    Yes Historical Provider, MD  gabapentin (NEURONTIN) 300 MG capsule Take 300 mg by mouth 3 (three) times daily.   Yes Edd Arbour, MD  insulin NPH-insulin regular (NOVOLIN 70/30) (70-30) 100 UNIT/ML injection Inject 80 Units into the skin 2 (two) times daily with a meal.   Yes Historical Provider, MD  lamoTRIgine (LAMICTAL) 200 MG tablet Take 200 mg by mouth at bedtime.   Yes Historical Provider, MD  lisinopril (PRINIVIL,ZESTRIL) 10 MG tablet Take 10 mg by mouth daily.   Yes Historical Provider, MD  QUEtiapine (SEROQUEL XR) 400 MG 24 hr tablet Take 800 mg by mouth at bedtime.   Yes Historical Provider, MD  rosuvastatin (CRESTOR) 20 MG tablet Take 20 mg by mouth daily.     Yes Historical Provider, MD  traMADol (ULTRAM) 50 MG tablet Take 50 mg by mouth 2 (two) times daily.   Yes Historical Provider, MD              OBJECTIVE  Vitals: Patient Vitals for the past 24 hrs:  BP Temp Temp src Pulse Resp SpO2  10/08/11 2214 109/61 mmHg 98 F (36.7 C) Oral 109  16  93 %  10/08/11 2015 122/65 mmHg - - 117  15  97 %  10/08/11 1945 122/65 mmHg - - 117  16  98 %  10/08/11 1854 125/81 mmHg - - 128  - 98 %  10/08/11 1853 124/82 mmHg - - 126  - 98 %  10/08/11 1721 130/93 mmHg - - 120  16  99 %  10/08/11 1714 122/82 mmHg 98.2 F (36.8 C) Oral 122  24  99 %   Wt Readings from Last 3 Encounters:  06/22/11 193 lb (87.544 kg)  06/15/11 189 lb (85.73 kg)  08/16/10 211 lb (95.709 kg)   No intake or output data in the 24 hours ending 10/08/11 2233  PE: GENERAL:  Adult caucasian female.  Examined in [facility] ED.  Sitting comfortably in hospital bed.  In  no discomfort; norespiratory distress.   PSYCH: Alert and appropriately interactive alertness: alert, affect: normal, thought content exhibits logical connections HNEENT: AT/[place], MMM, no scleral icterus, EOMi THORAX: HEART: Tachycardia, S1/S2 heard, no murmur LUNGS: CTA B, no wheezes, no crackles  ABDOMEN:  +BS, soft, non-tender, no rigidity, no guarding, no masses/organomegaly EXTREMITIES: Moves all 4 extremities spontaneously, warm well perfused, trace edema, bilateral DP and PT pulses 2/4.   >NEURO:Cranial nerve II - XII intact. Motor exam: normal strength, muscle mass, and tone in all extremities. Cerebellar exam noted no tremors noted, rapid alternating movements in the upper extremities were normal, heel to shin without dysmetria and no ataxic movements noted. Negative [place]omberg  LABS:  Basename 10/08/11 1746 10/08/11 1735  WBC -- 12.7*  HGB 13.6 13.5  HCT 40.0 39.1  PLT -- 269    Basename 10/08/11 1746  NA 134*  K 4.0  CL 102  CO2 --  BUN 10  CREATININE 0.70  CALCIUM --  GLUCOSE 365*   Basename 10/08/11 2112  GLUCAP 261*    Basename 06/15/11 0840  HGBA1C 9.2   Hematology:  [facility] 10/08/11 1735  LYMPHSABS 3.0  MONOABS 0.7  EOSABS 0.3  BASOSABS 0.0    Basename 10/08/11 1735  RDW 13.5  MCV 86.3  MCHC 34.5  MRET --    Basename 10/08/11 2134  DDIMER 0.42    Basename 10/08/11 1746  PHART --  PCO2ART --  PO2ART --  HCO3 --  TCO2 23  ACIDBASEDEF --  O2SAT --    Basename 10/08/11 1849  PREGTESTUR NEGATIVE  PREGSERUM --  HCG --  HCGQUANT --   MICRO: Urinalysis  Basename 10/08/11 1850  COLORURINE YELLOW  APPEARANCEUR CLEAR  LABSPEC 1.016  PHURINE 6.0  GLUCOSEU >1000*  HGBUR NEGATIVE  BILIRUBINUR NEGATIVE  KETONESUR 15*  PROTEINUR NEGATIVE  UROBILINOGEN 0.2  NITRITE NEGATIVE  LEUKOCYTESUR NEGATIVE   IMAGING: Dg Chest 2 View  10/08/2011  *RADIOLOGY REPORT*  Clinical Data: Fall with loss of consciousness  CHEST - 2 VIEW  Comparison:  03/19/2010  Findings: The heart size and mediastinal contours are within normal limits.  Both lungs are clear. Mild multilevel spondylosis within the thoracic spine.  IMPRESSION:  No acute findings.  Original Report Authenticated By: Rosealee Albee, M.D.   Dg Pelvis 1-2 Views  10/08/2011  *RADIOLOGY REPORT*  Clinical Data: Fall with loss of consciousness.  PELVIS - 1-2 VIEW  Comparison: None.  Findings: There is no evidence of fracture or dislocation.  There is no evidence of arthropathy or other focal bone abnormality. Soft tissues are unremarkable.  IMPRESSION: Negative examination.  Original Report Authenticated By: Rosealee Albee, M.D.   Ct Head & Neck  10/08/2011  *RADIOLOGY REPORT*  Clinical Data:  41 year old female status post fall.  Dizziness, syncope.  Pain.  CT HEAD WITHOUT CONTRAST CT CERVICAL SPINE WITHOUT CONTRAST  Technique:  Multidetector CT imaging of the head and cervical spine was performed following the standard protocol without intravenous contrast.  Multiplanar CT image reconstructions of the cervical spine were also generated.  Comparison:  Head CTs 06/22/2011 and earlier.  CT HEAD  Findings: Chronic retained metal foreign body along the left lateral convexity.  No scalp hematoma. Visualized orbit soft tissues are within normal limits.  Visualized paranasal sinuses and mastoids are clear.  No acute osseous abnormality identified.  Cerebral volume is within normal limits for age.  No midline shift, ventriculomegaly, mass effect, evidence of mass lesion, intracranial hemorrhage or evidence of cortically based acute infarction.  Gray-white matter differentiation is within normal limits throughout the brain.  Dominant left vertebral artery suspected. No suspicious intracranial vascular hyperdensity.  IMPRESSION: Stable and normal noncontrast CT appearance of the brain. Cervical findings are below.  CT CERVICAL  SPINE  Findings: Straightening of cervical lordosis. Visualized skull base is  intact.  No atlanto-occipital dissociation.  Cervicothoracic junction alignment is within normal limits.  Bilateral posterior element alignment is within normal limits.  Degenerative changes at the inferior C1 ring anteriorly related to the longus colli muscles.  No acute cervical fracture identified.  Grossly negative lung apices. Visualized paraspinal soft tissues are within normal limits.  IMPRESSION: No acute fracture or listhesis identified in the cervical spine. Ligamentous injury is not excluded.  Original Report Authenticated By: Harley Hallmark, M.D.              ASSESSMENT & PLAN  41 y.o. year old female with multiple chronic medical problems presenting with syncope  1. SYNCOPE & Tachycardia- Multiple etiologies considered - will place in telemetry overnight for closer monitoring - DDx includes:  - Seizure - high likely hood although no post ictal state and pt reports different than her normal seizure  - Cardiac - especially in setting of Tachycardia will cycle cardiac enzymes and place on tele  - HEME - ?PE in setting of prior VTE, syncope, tobacco abuse, and tachycardia - D-dimer obtained and negative  - Orthostatics however HR did not change with standing during exam - will document formal orthostatics  - neurogenic / vaso-vagal - low likelihood in setting of persistent tachycardia and occuring prior to BM  - Toxin induced - obtain UDS  - Consider baseline tachycardia  As has been documented in 100s in Clinic  2. CV - will cycle cardiac enzymes  3. Endocrine - Continue home 70/30 and place on CHO mod diet  4. NEURO/PSYCH (Seizure Disorder, Bipolar, Adjust disorder) - continue home meds.    - if pt has recurrent syncope or active seizure consider EEG and neuro referral  5. RESP (COPD, ASTHMA) - Will continue home meds  6. Tobacco Abuse - will place on patch and encourage pt to stop smoking  --- FEN  *NS @ 125ml/hr -CHO Mod --- PPx: Heparin             DISPOSITION  Will  place in observation and plan to d/c home tomorrow pending further workup and ACS rule out.    Andrena Mews, DO [name] Resident - PGY-1 10/08/2011 10:33 PM    PGY-2 Addendum  Patient seen and evaluated with PGY-1.  Agree with above findings and plan Broad differential for possible syncopal episode.  Looks to have baseline tachycardia (rate 100-110) when seen at clinic in the past, however given that she had syncopal episode associated with this will admit for observation on telemetry overnight.  No deficits on neuro exam and denies any feelings of weakness.  Do not believe this was a TIA.   In addition to above we willl check a TSH to be sure this is not contributing as well.  Everrett Coombe, DO

## 2011-10-08 NOTE — ED Provider Notes (Signed)
  I performed a history and physical examination of [name] and discussed her management with Dr. Criss Alvine.  I agree with the history, physical, assessment, and plan of care, with the following exceptions: None  Syncopal event in the bathroom. She felt lightheaded prior to the event when she abruptly passed out. She woke up in less than 5 minutes. She was found in the bathtub with right side pain. This is where she struck the bathtub. Has a history of seizures and syncope in the past. States this is not consistent with a seizure. She had no loss of bowel or bladder function. She had no preceding chest pain, shortness of breath, abdominal pain. She arrived significantly tachycardic. She had a sinus tachycardia at a rate of approximately 125. She received 2 L of IV fluids, multiple doses of pain medication which time her heart rate improved to approximately 115. She'll be admitted by the family practice service for persistent tachycardia.  I was present for the following procedures: None Time Spent in Critical Care of the patient: None Time spent in discussions with the patient and family: 10 min  Tildon Husky, MD 10/08/11 [phone]

## 2011-10-08 NOTE — ED Notes (Signed)
Attempted to call report.  RN just arrived and is getting report from floor.  Will call back in 10 minutes.

## 2011-10-09 ENCOUNTER — Encounter (HOSPITAL_COMMUNITY): Payer: Self-pay

## 2011-10-09 ENCOUNTER — Other Ambulatory Visit: Payer: Self-pay

## 2011-10-09 DIAGNOSIS — G40909 Epilepsy, unspecified, not intractable, without status epilepticus: Secondary | ICD-10-CM | POA: Diagnosis present

## 2011-10-09 DIAGNOSIS — R55 Syncope and collapse: Secondary | ICD-10-CM | POA: Diagnosis present

## 2011-10-09 DIAGNOSIS — F17200 Nicotine dependence, unspecified, uncomplicated: Secondary | ICD-10-CM

## 2011-10-09 DIAGNOSIS — G9341 Metabolic encephalopathy: Secondary | ICD-10-CM

## 2011-10-09 DIAGNOSIS — F319 Bipolar disorder, unspecified: Secondary | ICD-10-CM | POA: Diagnosis present

## 2011-10-09 DIAGNOSIS — R000 Tachycardia, unspecified: Secondary | ICD-10-CM | POA: Diagnosis present

## 2011-10-09 DIAGNOSIS — G9340 Encephalopathy, unspecified: Secondary | ICD-10-CM | POA: Diagnosis present

## 2011-10-09 HISTORY — DX: Metabolic encephalopathy: G93.41

## 2011-10-09 LAB — CBC
HCT: 33.2 % — ABNORMAL LOW (ref 36.0–46.0)
Hemoglobin: 11.3 g/dL — ABNORMAL LOW (ref 12.0–15.0)
MCH: 29.6 pg (ref 26.0–34.0)
MCHC: 34 g/dL (ref 30.0–36.0)
MCV: 86.9 fL (ref 78.0–100.0)
Platelets: 232 10*3/uL (ref 150–400)
RBC: 3.82 MIL/uL — ABNORMAL LOW (ref 3.87–5.11)
RDW: 13.6 % (ref 11.5–15.5)
WBC: 10.8 10*3/uL — ABNORMAL HIGH (ref 4.0–10.5)

## 2011-10-09 LAB — BASIC METABOLIC PANEL
BUN: 9 mg/dL (ref 6–23)
CO2: 22 mEq/L (ref 19–32)
Calcium: 8.6 mg/dL (ref 8.4–10.5)
Chloride: 101 mEq/L (ref 96–112)
Creatinine, Ser: 0.62 mg/dL (ref 0.50–1.10)
GFR calc Af Amer: >90 mL/min (ref >90)
GFR calc non Af Amer: >90 mL/min (ref >90)
Glucose, Bld: 292 mg/dL — ABNORMAL HIGH (ref 70–99)
Potassium: 4 mEq/L (ref 3.5–5.1)
Sodium: 133 mEq/L — ABNORMAL LOW (ref 135–145)

## 2011-10-09 LAB — GLUCOSE, CAPILLARY
Glucose-Capillary: 370 mg/dL — ABNORMAL HIGH (ref 70–99)
Glucose-Capillary: 237 mg/dL — ABNORMAL HIGH (ref 70–99)
Glucose-Capillary: 313 mg/dL — ABNORMAL HIGH (ref 70–99)

## 2011-10-09 LAB — HEMOGLOBIN A1C
Hgb A1c MFr Bld: 10.1 % — ABNORMAL HIGH (ref <5.7)
Mean Plasma Glucose: 243 mg/dL — ABNORMAL HIGH (ref <117)

## 2011-10-09 LAB — TSH: TSH: 2.388 u[IU]/mL (ref 0.350–4.500)

## 2011-10-09 MED ORDER — QUETIAPINE FUMARATE ER 400 MG PO TB24
800.0000 mg | ORAL_TABLET | Freq: Every day | ORAL | Status: DC
  Filled 2011-10-09: qty 2

## 2011-10-09 MED ORDER — LAMOTRIGINE 200 MG PO TABS
200.0000 mg | ORAL_TABLET | Freq: Every day | ORAL | Status: DC
  Filled 2011-10-09: qty 1

## 2011-10-09 MED ORDER — GABAPENTIN 300 MG PO CAPS
300.0000 mg | ORAL_CAPSULE | Freq: Three times a day (TID) | ORAL | Status: DC
  Administered 2011-10-09: 300 mg via ORAL
  Filled 2011-10-09 (×3): qty 1

## 2011-10-09 MED ORDER — FLUTICASONE-SALMETEROL 500-50 MCG/DOSE IN AEPB
1.0000 | INHALATION_SPRAY | Freq: Two times a day (BID) | RESPIRATORY_TRACT | Status: DC
  Administered 2011-10-09: 1 via RESPIRATORY_TRACT
  Filled 2011-10-09: qty 14

## 2011-10-09 MED ORDER — ASPIRIN EC 81 MG PO TBEC
81.0000 mg | DELAYED_RELEASE_TABLET | Freq: Every day | ORAL | Status: DC
  Administered 2011-10-09: 81 mg via ORAL
  Filled 2011-10-09: qty 1

## 2011-10-09 MED ORDER — CLONAZEPAM 0.5 MG PO TABS
0.5000 mg | ORAL_TABLET | Freq: Three times a day (TID) | ORAL | Status: DC
  Administered 2011-10-09: 0.5 mg via ORAL
  Filled 2011-10-09: qty 1

## 2011-10-09 MED ORDER — LISINOPRIL 10 MG PO TABS
10.0000 mg | ORAL_TABLET | Freq: Every day | ORAL | Status: DC
  Administered 2011-10-09: 10 mg via ORAL
  Filled 2011-10-09: qty 1

## 2011-10-09 MED ORDER — ALBUTEROL SULFATE HFA 108 (90 BASE) MCG/ACT IN AERS
2.0000 | INHALATION_SPRAY | RESPIRATORY_TRACT | Status: DC | PRN
  Filled 2011-10-09: qty 6.7

## 2011-10-09 MED ORDER — NICOTINE 14 MG/24HR TD PT24
14.0000 mg | MEDICATED_PATCH | Freq: Every day | TRANSDERMAL | Status: DC
  Administered 2011-10-09: 14 mg via TRANSDERMAL
  Filled 2011-10-09: qty 1

## 2011-10-09 MED ORDER — INSULIN ASPART PROT & ASPART (70-30 MIX) 100 UNIT/ML ~~LOC~~ SUSP
80.0000 [IU] | Freq: Two times a day (BID) | SUBCUTANEOUS | Status: DC
  Administered 2011-10-09: 40 [IU] via SUBCUTANEOUS
  Filled 2011-10-09: qty 3

## 2011-10-09 MED ORDER — ATORVASTATIN CALCIUM 40 MG PO TABS
40.0000 mg | ORAL_TABLET | Freq: Every day | ORAL | Status: DC
  Filled 2011-10-09: qty 1

## 2011-10-09 MED ORDER — FLUOXETINE HCL 20 MG PO CAPS
40.0000 mg | ORAL_CAPSULE | Freq: Every day | ORAL | Status: DC
  Administered 2011-10-09: 40 mg via ORAL
  Filled 2011-10-09: qty 2

## 2011-10-09 NOTE — Discharge Summary (Signed)
Family Medicine Teaching Service  Discharge Note : Attending Denny Levy MD Pager [phone] Office [phone] I have seen and examined this patient, reviewed their chart and discussed discharge planning wit the resident at the time of discharge. I agree with the discharge plan as above. Suspect mild dehydration was responsible for syncopal event. Tachycardia seems chronic as I reviewed her outpatient chart. TSH is still pending but plan d/c as this work up for tachycardia can continue outpatient.

## 2011-10-09 NOTE — Discharge Summary (Signed)
Physician Discharge Summary  Patient ID: [name] MRN: [id number] DOB/AGE: 02-03-1971 41 y.o.  Admit date: 10/08/2011 Discharge date: 10/09/2011  Admission Diagnoses: Patient Active Problem List  Diagnoses  - Syncope  - Tachycardia  - DIABETIC PERIPHERAL NEUROPATHY  - DM W/NEURO MNFST, TYPE II, UNCONTROLLED  - HYPERTRIGLYCERIDEMIA  - OBESITY NOS  - ANXIETY STATE, UNSPECIFIED  - TOBACCO DEPENDENCE  - COPD  - ASTHMA, PERSISTENT  - Seizures   Discharge Diagnoses:  Principal Problem:  *Syncope Active Problems:  DIABETIC PERIPHERAL NEUROPATHY  DM W/NEURO MNFST, TYPE II, UNCONTROLLED  COPD  Sinus tachycardia  Seizure disorder  Bipolar disorder  Tobacco dependence   Discharged Condition: stable  Hospital Course:  1. Syncope: Patient admitted overnight for observation.  No further episodes of syncope or lightheadedness.  No clear cause for syncope.  Neuro exam unremarkable.  Imaging negative.  Has hx of seizure d/o but history did not sound like seizure, although mildly elevated WBC count; may be due to demargination.   Did have persistent sinus tachycardia, but no arrhythmias on telemetry.  Her UDS was positive only for THC.    2. Sinus Tachycardia:  Baseline HR appears to be around 100-110 when seen in clinic previously.  Initially rate of 125-130 upon arrival to ED.  This improved to around 115 after 2L NS.  No signs of infection. Continued hydration overnight and HR remained around 100-120.  Patient asymptomatic with this HR.  Cardiac enzymes were negative x1 and EKG was unremarkable. D-Dimer negative.  UDS negative for cocaine/stimulants. TSH is pending at time of discharge.  3.  DM:  Seems to be poorly controlled in the past.  She was continued on her home dose of 70/30 insulin during the hospitalization.  Her glucose was moderately controlled with this regimen.  HgbA1c was ordered and was pending at time of discharge.    4. COPD:  She was continued on her home meds  during the duration of the hospitalization.  Is a current tobacco user. Nicotine patch and tobacco cessation counseling done in hospital.   5. Bipolar:  Extensive list of anti-epileptics and psych meds.  She is unsure which are for her seizure d/o and which are for her bipolar d/o.  Her home medications were continued during the hospitalization.    6. Seizure D/O:  Anti-epileptics continued during hospitalization, no seizure activity seen while in the hospital.  Consults: None  Significant Diagnostic Studies: PREGNANCY, URINE     Status: Normal   Collection Time   10/08/11  6:49 PM      Component Value Range   Preg Test, Ur NEGATIVE  NEGATIVE   URINALYSIS, ROUTINE W REFLEX MICROSCOPIC     Status: Abnormal   Collection Time   10/08/11  6:50 PM      Component Value Range   Color, Urine YELLOW  YELLOW    APPearance CLEAR  CLEAR    Specific Gravity, Urine 1.016  1.005 - 1.030    pH 6.0  5.0 - 8.0    Glucose, UA >1000 (*) NEGATIVE (mg/dL)   Hgb urine dipstick NEGATIVE  NEGATIVE    Bilirubin Urine NEGATIVE  NEGATIVE    Ketones, ur 15 (*) NEGATIVE (mg/dL)   Protein, ur NEGATIVE  NEGATIVE (mg/dL)   Urobilinogen, UA 0.2  0.0 - 1.0 (mg/dL)   Nitrite NEGATIVE  NEGATIVE    Leukocytes, UA NEGATIVE  NEGATIVE   URINE MICROSCOPIC-ADD ON     Status: Abnormal   Collection Time   10/08/11  6:50 PM      Component Value Range   Squamous Epithelial / LPF FEW (*) RARE   URINE RAPID DRUG SCREEN (HOSP PERFORMED)     Status: Abnormal   Collection Time   10/08/11  6:51 PM      Component Value Range   Opiates NONE DETECTED  NONE DETECTED    Cocaine NONE DETECTED  NONE DETECTED    Benzodiazepines NONE DETECTED  NONE DETECTED    Amphetamines NONE DETECTED  NONE DETECTED    Tetrahydrocannabinol POSITIVE (*) NONE DETECTED    Barbiturates NONE DETECTED  NONE DETECTED   D-DIMER, QUANTITATIVE     Status: Normal   Collection Time   10/08/11  9:34 PM      Component Value Range   D-Dimer, Quant 0.42  0.00 -  0.48 (ug/mL-FEU)  CBC     Status: Abnormal   Collection Time   10/08/11 10:26 PM      Component Value Range   WBC 12.1 (*) 4.0 - 10.5 (K/uL)   RBC 4.27  3.87 - 5.11 (MIL/uL)   Hemoglobin 12.9  12.0 - 15.0 (g/dL)   HCT 40.9  81.1 - 91.4 (%)   MCV 86.2  78.0 - 100.0 (fL)   MCH 30.2  26.0 - 34.0 (pg)   MCHC 35.1  30.0 - 36.0 (g/dL)   RDW 78.2  95.6 - 21.3 (%)   Platelets 258  150 - 400 (K/uL)  CARDIAC PANEL(CRET KIN+CKTOT+MB+TROPI)     Status: Normal   Collection Time   10/08/11 10:33 PM      Component Value Range   Total CK 90  7 - 177 (U/L)   CK, MB 1.7  0.3 - 4.0 (ng/mL)   Troponin I <0.30  <0.30 (ng/mL)   Relative Index RELATIVE INDEX IS INVALID  0.0 - 2.5   BASIC METABOLIC PANEL     Status: Abnormal   Collection Time   10/09/11  5:03 AM      Component Value Range   Sodium 133 (*) 135 - 145 (mEq/L)   Potassium 4.0  3.5 - 5.1 (mEq/L)   Chloride 101  96 - 112 (mEq/L)   CO2 22  19 - 32 (mEq/L)   Glucose, Bld 292 (*) 70 - 99 (mg/dL)   BUN 9  6 - 23 (mg/dL)   Creatinine, Ser 0.86  0.50 - 1.10 (mg/dL)   Calcium 8.6  8.4 - 57.8 (mg/dL)   GFR calc non Af Amer >90  >90 (mL/min)   GFR calc Af Amer >90  >90 (mL/min)  CBC     Status: Abnormal   Collection Time   10/09/11  5:03 AM      Component Value Range   WBC 10.8 (*) 4.0 - 10.5 (K/uL)   RBC 3.82 (*) 3.87 - 5.11 (MIL/uL)   Hemoglobin 11.3 (*) 12.0 - 15.0 (g/dL)   HCT 46.9 (*) 62.9 - 46.0 (%)   MCV 86.9  78.0 - 100.0 (fL)   MCH 29.6  26.0 - 34.0 (pg)   MCHC 34.0  30.0 - 36.0 (g/dL)   RDW 52.8  41.3 - 24.4 (%)   Platelets 232  150 - 400 (K/uL)      Dg Chest 2 View 10/08/2011 IMPRESSION:  No acute findings.    Dg Pelvis 1-2 Views 10/08/2011   IMPRESSION: Negative examination..   Ct Head Wo Contrast Ct Cervical Spine Wo Contrast  3/30/2013CT HEAD WITHOUT CONTRAST CT CERVICAL SPINE WITHOUT CONTRAST    CT HEAD  Findings: Chronic retained metal foreign body along the left lateral convexity.  No scalp hematoma. Visualized  orbit soft tissues are within normal limits.  Visualized paranasal sinuses and mastoids are clear.  No acute osseous abnormality identified.  Cerebral volume is within normal limits for age.  No midline shift, ventriculomegaly, mass effect, evidence of mass lesion, intracranial hemorrhage or evidence of cortically based acute infarction.  Gray-white matter differentiation is within normal limits throughout the brain.  Dominant left vertebral artery suspected. No suspicious intracranial vascular hyperdensity.  IMPRESSION: Stable and normal noncontrast CT appearance of the brain. Cervical findings are below.  CT CERVICAL SPINE  IMPRESSION: No acute fracture or listhesis identified in the cervical spine. Ligamentous injury is not excluded.      Treatments: IV hydration, insulin:  and respiratory therapy: O2  Discharge Exam: Blood pressure 119/72, pulse 102, temperature 98 F (36.7 C), temperature source Oral, resp. rate 20, height 5\' 4"  (1.626 m), weight 215 lb 2.7 oz (97.6 kg), last menstrual period 10/01/2011, SpO2 98.00%.  Disposition: discharged to home in stable medical condition   Medication List  As of 10/09/2011  8:53 AM   TAKE these medications         ADVAIR DISKUS 500-50 MCG/DOSE Aepb   Generic drug: Fluticasone-Salmeterol   Inhale 1 puff into the lungs 2 (two) times daily.      albuterol 108 (90 BASE) MCG/ACT inhaler   Commonly known as: PROVENTIL HFA;VENTOLIN HFA   Inhale 2 puffs into the lungs every 4 (four) hours as needed. For shortness of breath      albuterol (2.5 MG/3ML) 0.083% nebulizer solution   Commonly known as: PROVENTIL   Take 2.5 mg by nebulization daily.      ANACIN 81 MG EC tablet   Generic drug: aspirin   Take 81 mg by mouth daily.      CRESTOR 20 MG tablet   Generic drug: rosuvastatin   Take 20 mg by mouth daily.      FLUoxetine 40 MG capsule   Commonly known as: PROZAC   Take 40 mg by mouth daily.      gabapentin 300 MG capsule   Commonly known as:  NEURONTIN   Take 300 mg by mouth 3 (three) times daily.      insulin NPH-insulin regular (70-30) 100 UNIT/ML injection   Commonly known as: NOVOLIN 70/30   Inject 80 Units into the skin 2 (two) times daily with a meal.      lamoTRIgine 200 MG tablet   Commonly known as: LAMICTAL   Take 200 mg by mouth at bedtime.      lisinopril 10 MG tablet   Commonly known as: PRINIVIL,ZESTRIL   Take 10 mg by mouth daily.      QUEtiapine 400 MG 24 hr tablet   Commonly known as: SEROQUEL XR   Take 800 mg by mouth at bedtime.      traMADol 50 MG tablet   Commonly known as: ULTRAM   Take 50 mg by mouth 2 (two) times daily.         ASK your doctor about these medications         KLONOPIN 0.5 MG tablet   Generic drug: clonazePAM   Take 0.5 mg by mouth 3 (three) times daily.           Follow-up Information    Follow up with Edd Arbour, MD .         Signed: MATTHEWS,CODY 10/09/2011, 8:17 AM

## 2011-10-09 NOTE — Progress Notes (Cosign Needed)
Subjective:  R side of body sore this am from fall.  She was able to ambulate to the bathroom last night with no difficulty.  No dizziness, no chest pain, no increased shortness of breath.  Objective: Vital signs in last 24 hours: Temp:  [97.6 F (36.4 C)-98.3 F (36.8 C)] 98 F (36.7 C) (03/31 0700) Pulse Rate:  [80-128] 102  (03/31 0700) Resp:  [15-24] 20  (03/31 0700) BP: (92-130)/(52-93) 119/72 mmHg (03/31 0700) SpO2:  [93 %-99 %] 98 % (03/31 0500) Weight:  [215 lb 2.7 oz (97.6 kg)] 215 lb 2.7 oz (97.6 kg) (03/31 0100) Weight change:  Last BM Date: 10/08/11  Intake/Output from previous day: 03/30 0701 - 03/31 0700 In: 480 [P.O.:480] Out: -  Intake/Output this shift:    General appearance: alert, cooperative, appears older than stated age and no distress Resp: clear to auscultation bilaterally Cardio: S1, S2 normal and tachycardic GI: soft, non-tender; bowel sounds normal; no masses,  no organomegaly Extremities: extremities normal, atraumatic, no cyanosis or edema Skin: Skin color, texture, turgor normal. No rashes or lesions Neurologic: Alert and oriented X 3, normal strength and tone. Normal symmetric reflexes. Normal coordination and gait Cranial nerves: normal  Lab Results:  Basename 10/09/11 0503 10/08/11 2226  WBC 10.8* 12.1*  HGB 11.3* 12.9  HCT 33.2* 36.8  PLT 232 258   BMET  Basename 10/09/11 0503 10/08/11 2226 10/08/11 1746  NA 133* -- 134*  K 4.0 -- 4.0  CL 101 -- 102  CO2 22 -- --  GLUCOSE 292* -- 365*  BUN 9 -- 10  CREATININE 0.62 0.61 --  CALCIUM 8.6 -- --    Studies/Results: Dg Chest 2 View  10/08/2011  *RADIOLOGY REPORT*  Clinical Data: Fall with loss of consciousness  CHEST - 2 VIEW  Comparison: 03/19/2010  Findings: The heart size and mediastinal contours are within normal limits.  Both lungs are clear. Mild multilevel spondylosis within the thoracic spine.  IMPRESSION:  No acute findings.  Original Report Authenticated By: Rosealee Albee, M.D.   Dg Pelvis 1-2 Views  10/08/2011  *RADIOLOGY REPORT*  Clinical Data: Fall with loss of consciousness.  PELVIS - 1-2 VIEW  Comparison: None.  Findings: There is no evidence of fracture or dislocation.  There is no evidence of arthropathy or other focal bone abnormality. Soft tissues are unremarkable.  IMPRESSION: Negative examination.  Original Report Authenticated By: Rosealee Albee, M.D.   Ct Head Wo Contrast  10/08/2011  *RADIOLOGY REPORT*  Clinical Data:  41 year old female status post fall.  Dizziness, syncope.  Pain.  CT HEAD WITHOUT CONTRAST CT CERVICAL SPINE WITHOUT CONTRAST  Technique:  Multidetector CT imaging of the head and cervical spine was performed following the standard protocol without intravenous contrast.  Multiplanar CT image reconstructions of the cervical spine were also generated.  Comparison:  Head CTs 06/22/2011 and earlier.  CT HEAD  Findings: Chronic retained metal foreign body along the left lateral convexity.  No scalp hematoma. Visualized orbit soft tissues are within normal limits.  Visualized paranasal sinuses and mastoids are clear.  No acute osseous abnormality identified.  Cerebral volume is within normal limits for age.  No midline shift, ventriculomegaly, mass effect, evidence of mass lesion, intracranial hemorrhage or evidence of cortically based acute infarction.  Gray-white matter differentiation is within normal limits throughout the brain.  Dominant left vertebral artery suspected. No suspicious intracranial vascular hyperdensity.  IMPRESSION: Stable and normal noncontrast CT appearance of the brain. Cervical findings are below.  CT CERVICAL SPINE  Findings: Straightening of cervical lordosis. Visualized skull base is intact.  No atlanto-occipital dissociation.  Cervicothoracic junction alignment is within normal limits.  Bilateral posterior element alignment is within normal limits.  Degenerative changes at the inferior C1 ring anteriorly related to  the longus colli muscles.  No acute cervical fracture identified.  Grossly negative lung apices. Visualized paraspinal soft tissues are within normal limits.  IMPRESSION: No acute fracture or listhesis identified in the cervical spine. Ligamentous injury is not excluded.  Original Report Authenticated By: Harley Hallmark, M.D.   Ct Cervical Spine Wo Contrast  10/08/2011  *RADIOLOGY REPORT*  Clinical Data:  41 year old female status post fall.  Dizziness, syncope.  Pain.  CT HEAD WITHOUT CONTRAST CT CERVICAL SPINE WITHOUT CONTRAST  Technique:  Multidetector CT imaging of the head and cervical spine was performed following the standard protocol without intravenous contrast.  Multiplanar CT image reconstructions of the cervical spine were also generated.  Comparison:  Head CTs 06/22/2011 and earlier.  CT HEAD  Findings: Chronic retained metal foreign body along the left lateral convexity.  No scalp hematoma. Visualized orbit soft tissues are within normal limits.  Visualized paranasal sinuses and mastoids are clear.  No acute osseous abnormality identified.  Cerebral volume is within normal limits for age.  No midline shift, ventriculomegaly, mass effect, evidence of mass lesion, intracranial hemorrhage or evidence of cortically based acute infarction.  Gray-white matter differentiation is within normal limits throughout the brain.  Dominant left vertebral artery suspected. No suspicious intracranial vascular hyperdensity.  IMPRESSION: Stable and normal noncontrast CT appearance of the brain. Cervical findings are below.  CT CERVICAL SPINE  Findings: Straightening of cervical lordosis. Visualized skull base is intact.  No atlanto-occipital dissociation.  Cervicothoracic junction alignment is within normal limits.  Bilateral posterior element alignment is within normal limits.  Degenerative changes at the inferior C1 ring anteriorly related to the longus colli muscles.  No acute cervical fracture identified.  Grossly  negative lung apices. Visualized paraspinal soft tissues are within normal limits.  IMPRESSION: No acute fracture or listhesis identified in the cervical spine. Ligamentous injury is not excluded.  Original Report Authenticated By: Harley Hallmark, M.D.    Medications: I have reviewed the patient's current medications.  Assessment/Plan: 40 yo female with hx of seizure d/o, COPD, DM, and Bipolar d/o here with syncopal episode and tachycardia  1.  Syncopal episode:  No further events overnight.  Denies dizziness, lightheadedness.  D-Dimer negative, unlikely to be PE.  CE negative, and no events on telemetry.  Does not appear to be seizure activity or hypoglycemia.  ?neurogenic syncope vs. Psych (has had questionable conversion d/o in the past).  2.  Tachycardia:  Sinus tach, no chest pain and asymptomatic.  Cardiac enzymes negative x1.  EKG unremarkable.  Relatively no change with hydration.  TSH pending.    3. COPD:  Continue current meds, O2 prn.  States she is on 2L home O2  4. DM:  Appears to be poorly controlled at home.  A1c pending.  CBG's moderately controlled in hospital.   5. Bipolar:  On a variety of anti-epileptics and psych meds.  Unsure which she takes for seizure d/o and which she takes for bipolar.  Home meds continued here  6. Seizure d/o:  Home anti-epileptics continued, as above unsure which she takes for which condition. Has not had any seizure activity while in hospital.   7. Tobacco dependance:  Tobacco cessation and nicotine patch  8.  Dispo:  Given negative work-up and no further symptoms can likely go home today.   LOS: 1 day   Aubry Tucholski 10/09/2011, 9:21 AM

## 2011-10-09 NOTE — H&P (Signed)
[facility] Attending Admission Note: Jesus Nevills MD [phone] pager office [phone] I  have seen and examined this patient, reviewed their chart. I have discussed this patient with the resident. I agree with the resident's findings, assessment and care plan. 

## 2011-10-12 NOTE — Progress Notes (Signed)
Utilization review completed. Odis Turck, RN, BSN. 10/12/11 

## 2011-10-18 DIAGNOSIS — F3131 Bipolar disorder, current episode depressed, mild: Secondary | ICD-10-CM | POA: Diagnosis not present

## 2011-10-20 DIAGNOSIS — E785 Hyperlipidemia, unspecified: Secondary | ICD-10-CM | POA: Diagnosis not present

## 2011-10-20 DIAGNOSIS — E1142 Type 2 diabetes mellitus with diabetic polyneuropathy: Secondary | ICD-10-CM | POA: Diagnosis not present

## 2011-10-20 DIAGNOSIS — E1129 Type 2 diabetes mellitus with other diabetic kidney complication: Secondary | ICD-10-CM | POA: Diagnosis not present

## 2011-10-20 DIAGNOSIS — E669 Obesity, unspecified: Secondary | ICD-10-CM | POA: Diagnosis not present

## 2011-10-20 DIAGNOSIS — E1165 Type 2 diabetes mellitus with hyperglycemia: Secondary | ICD-10-CM | POA: Diagnosis not present

## 2011-10-20 DIAGNOSIS — N189 Chronic kidney disease, unspecified: Secondary | ICD-10-CM | POA: Diagnosis not present

## 2011-10-20 DIAGNOSIS — F17200 Nicotine dependence, unspecified, uncomplicated: Secondary | ICD-10-CM | POA: Diagnosis not present

## 2011-10-20 DIAGNOSIS — E1149 Type 2 diabetes mellitus with other diabetic neurological complication: Secondary | ICD-10-CM | POA: Diagnosis not present

## 2011-10-28 ENCOUNTER — Other Ambulatory Visit: Payer: Self-pay | Admitting: Family Medicine

## 2011-10-28 MED ORDER — TRAMADOL HCL 50 MG PO TABS: 50.0000 mg | ORAL_TABLET | Freq: Two times a day (BID) | ORAL | Status: DC

## 2011-11-18 ENCOUNTER — Encounter (HOSPITAL_COMMUNITY): Payer: Self-pay | Admitting: Emergency Medicine

## 2011-11-18 ENCOUNTER — Emergency Department (INDEPENDENT_AMBULATORY_CARE_PROVIDER_SITE_OTHER)
Admission: EM | Admit: 2011-11-18 | Discharge: 2011-11-18 | Discharge status: Against Medical Advice | Payer: Managed Care, Other (non HMO) | Source: Home / Self Care | Attending: Emergency Medicine | Admitting: Emergency Medicine

## 2011-11-18 DIAGNOSIS — R079 Chest pain, unspecified: Secondary | ICD-10-CM | POA: Diagnosis not present

## 2011-11-18 HISTORY — DX: Cerebral infarction, unspecified: I63.9

## 2011-11-18 HISTORY — DX: Gastro-esophageal reflux disease without esophagitis: K21.9

## 2011-11-18 NOTE — ED Provider Notes (Signed)
History     CSN: [id number]  Arrival date & time 11/18/11  1656   First MD Initiated Contact with Patient 11/18/11 1748      Chief Complaint  Patient presents with  . Chest Pain  . Panic Attack    (Consider location/radiation/quality/duration/timing/severity/associated sxs/prior treatment) HPI Comments: Patient with an extensive medical history including COPD/asthma, poorly controlled diabetes, hypertension, DVT, presenting with 4 days of nonradiating, substernal chest pain described as "tightness" and "heaviness" lasting for hours.  discomfort is worse with exertion. No positional component. She has tried relaxation, NSAIDs, albuterol, oxygen without relief. Took 600 milligrams ibuprofen, last dose approximately an hour prior to arrival. No nausea, vomiting, diaphoresis. No change in her baseline dyspnea, coughing, wheezing. No abdominal pain, orthopnea, PND, nocturia, lower extremity swelling. She states that she is never had chest discomfort like this before. Does not think that this is a COPD exacerbation. States her glucose has been running in 300s and 400s over the past 2 days. Baseline for her is 200's.   ROS as noted in HPI. All other ROS negative.   Patient is a 41 y.o. female presenting with chest pain. The history is provided by the patient. No language interpreter was used.  Chest Pain The chest pain began 3 - 5 days ago. Chest pain occurs intermittently. The chest pain is worsening. The pain is associated with exertion. The quality of the pain is described as pressure-like, heavy and tightness. The pain does not radiate. Chest pain is worsened by exertion. Primary symptoms include palpitations. Pertinent negatives for primary symptoms include no fever, no syncope, no shortness of breath, no cough, no wheezing, no abdominal pain, no nausea and no vomiting.  The palpitations did not occur with shortness of breath.   Pertinent negatives for associated symptoms include no  diaphoresis, no lower extremity edema, no orthopnea and no paroxysmal nocturnal dyspnea.     Past Medical History  Diagnosis Date  . Asthma   . COPD (chronic obstructive pulmonary disease)   . Diabetes mellitus   . Renal disorder   . Hypertension   . Seizures   . Syncope   . Bipolar 1 disorder   . DVT (deep venous thrombosis)   . Stroke     2010  . GERD (gastroesophageal reflux disease)     Past Surgical History  Procedure Date  . Laparoscopy     Endometriosis    Family History  Problem Relation Age of Onset  . Venous thrombosis Brother   . Asthma Father   . Diabetes Father   . Coronary artery disease Mother   . Hypertension Mother   . Diabetes Mother   . Asthma Sister     History  Substance Use Topics  . Smoking status: Current Everyday Smoker -- 1.0 packs/day for 20 years    Types: Cigarettes  . Smokeless tobacco: Not on file  . Alcohol Use: No    OB History    Grav Para Term Preterm Abortions TAB SAB Ect Mult Living                  Review of Systems  Constitutional: Negative for fever and diaphoresis.  Respiratory: Negative for cough, shortness of breath and wheezing.   Cardiovascular: Positive for chest pain and palpitations. Negative for orthopnea and syncope.  Gastrointestinal: Negative for nausea, vomiting and abdominal pain.    Allergies  Montelukast sodium; Morphine sulfate; and Penicillins  Home Medications   Current Outpatient Rx  Name Route Sig Dispense  Refill  . IBUPROFEN 600 MG PO TABS Oral Take 600 mg by mouth every 6 (six) hours as needed.    . ALBUTEROL SULFATE HFA 108 (90 BASE) MCG/ACT IN AERS Inhalation Inhale 2 puffs into the lungs every 4 (four) hours as needed. For shortness of breath    . ALBUTEROL SULFATE (2.5 MG/3ML) 0.083% IN NEBU Nebulization Take 2.5 mg by nebulization daily.      . ASPIRIN 81 MG PO TBEC Oral Take 81 mg by mouth daily.      Marland Kitchen CLONAZEPAM 0.5 MG PO TABS Oral Take 0.5 mg by mouth 3 (three) times daily.       Marland Kitchen FLUOXETINE HCL 40 MG PO CAPS Oral Take 40 mg by mouth daily.     Marland Kitchen FLUTICASONE-SALMETEROL 500-50 MCG/DOSE IN AEPB Inhalation Inhale 1 puff into the lungs 2 (two) times daily.     Marland Kitchen GABAPENTIN 300 MG PO CAPS Oral Take 300 mg by mouth 3 (three) times daily.    . INSULIN ISOPHANE & REGULAR (70-30) 100 UNIT/ML [place] SUSP Subcutaneous Inject 80 Units into the skin 2 (two) times daily with a meal.    . LAMOTRIGINE 200 MG PO TABS Oral Take 200 mg by mouth at bedtime.    Marland Kitchen LISINOPRIL 10 MG PO TABS Oral Take 10 mg by mouth daily.    . QUETIAPINE FUMARATE ER 400 MG PO TB24 Oral Take 800 mg by mouth at bedtime.    Marland Kitchen ROSUVASTATIN CALCIUM 20 MG PO TABS Oral Take 20 mg by mouth daily.      . TRAMADOL HCL 50 MG PO TABS Oral Take 1 tablet (50 mg total) by mouth 2 (two) times daily. 30 tablet 5    BP 155/78  Pulse 112  Temp(Src) 98.3 F (36.8 C) (Oral)  Resp 18  SpO2 100%  LMP 11/11/2011  Physical Exam  Nursing note and vitals reviewed. Constitutional: She is oriented to person, place, and time. She appears well-developed and well-nourished. No distress.  HENT:  Head: Normocephalic and atraumatic.  Eyes: Conjunctivae and EOM are normal.  Neck: Normal range of motion. No JVD present.  Cardiovascular: Normal rate.   Pulmonary/Chest: Effort normal. No respiratory distress. She has wheezes. She has no rales. She exhibits tenderness.    Abdominal: Soft. Normal appearance and bowel sounds are normal. She exhibits no distension and no mass. There is no tenderness. There is no rebound, no guarding and no CVA tenderness.  Musculoskeletal: Normal range of motion. She exhibits no edema and no tenderness.  Neurological: She is alert and oriented to person, place, and time.  Skin: Skin is warm and dry.  Psychiatric: Her behavior is normal. Judgment and thought content normal. Her mood appears anxious.    ED Course  Procedures (including critical care time)  Labs Reviewed - No data to display No results  found.   1. Chest pain     EKG: Sinus tachycardia, rate 108. Normal axis, normal intervals, no hypertrophy, no ST T-wave changes compared to EKG from 10/09/2011.Marland Kitchen   MDM  Records extensively reviewed. Patient was admitted on 10/08/2011 for syncope. She was tachycardic persistently throughout hospital stay. Cardiac enzymes negative.  Suspect COPD exacerbation, as patient has some wheezing, and sternal tenderness, however, the setting 100% room air, no respiratory distress. Patient has multiple comorbidities, historical features concerning for cardiac etiology of her symptoms. Transferring to the ED.  Luiz Blare, MD 11/18/11 1902

## 2011-11-18 NOTE — ED Notes (Signed)
Pt here with intermit mid sternal CP,nonradiating x 3 dys.pain at rest with tightness.pt denies cold sx but states she has hx anxiety and takes meds for disorder.pt resting comfortable.appears i no distress.provider given ekg report.pt also has hx COPD AND is on 2 liters [place] at night

## 2011-12-01 DIAGNOSIS — E1165 Type 2 diabetes mellitus with hyperglycemia: Secondary | ICD-10-CM | POA: Diagnosis not present

## 2011-12-01 DIAGNOSIS — F17200 Nicotine dependence, unspecified, uncomplicated: Secondary | ICD-10-CM | POA: Diagnosis not present

## 2011-12-01 DIAGNOSIS — E1129 Type 2 diabetes mellitus with other diabetic kidney complication: Secondary | ICD-10-CM | POA: Diagnosis not present

## 2011-12-01 DIAGNOSIS — E785 Hyperlipidemia, unspecified: Secondary | ICD-10-CM | POA: Diagnosis not present

## 2011-12-01 DIAGNOSIS — E1142 Type 2 diabetes mellitus with diabetic polyneuropathy: Secondary | ICD-10-CM | POA: Diagnosis not present

## 2011-12-01 DIAGNOSIS — E669 Obesity, unspecified: Secondary | ICD-10-CM | POA: Diagnosis not present

## 2011-12-01 DIAGNOSIS — E1149 Type 2 diabetes mellitus with other diabetic neurological complication: Secondary | ICD-10-CM | POA: Diagnosis not present

## 2011-12-01 DIAGNOSIS — N189 Chronic kidney disease, unspecified: Secondary | ICD-10-CM | POA: Diagnosis not present

## 2011-12-01 DIAGNOSIS — R000 Tachycardia, unspecified: Secondary | ICD-10-CM | POA: Diagnosis not present

## 2011-12-28 DIAGNOSIS — F3131 Bipolar disorder, current episode depressed, mild: Secondary | ICD-10-CM | POA: Diagnosis not present

## 2012-01-03 DIAGNOSIS — E1142 Type 2 diabetes mellitus with diabetic polyneuropathy: Secondary | ICD-10-CM | POA: Diagnosis not present

## 2012-01-03 DIAGNOSIS — N189 Chronic kidney disease, unspecified: Secondary | ICD-10-CM | POA: Diagnosis not present

## 2012-01-03 DIAGNOSIS — E669 Obesity, unspecified: Secondary | ICD-10-CM | POA: Diagnosis not present

## 2012-01-03 DIAGNOSIS — F17200 Nicotine dependence, unspecified, uncomplicated: Secondary | ICD-10-CM | POA: Diagnosis not present

## 2012-01-03 DIAGNOSIS — E1129 Type 2 diabetes mellitus with other diabetic kidney complication: Secondary | ICD-10-CM | POA: Diagnosis not present

## 2012-01-03 DIAGNOSIS — E785 Hyperlipidemia, unspecified: Secondary | ICD-10-CM | POA: Diagnosis not present

## 2012-01-03 DIAGNOSIS — E1165 Type 2 diabetes mellitus with hyperglycemia: Secondary | ICD-10-CM | POA: Diagnosis not present

## 2012-01-03 DIAGNOSIS — L97509 Non-pressure chronic ulcer of other part of unspecified foot with unspecified severity: Secondary | ICD-10-CM | POA: Diagnosis not present

## 2012-01-03 DIAGNOSIS — E1149 Type 2 diabetes mellitus with other diabetic neurological complication: Secondary | ICD-10-CM | POA: Diagnosis not present

## 2012-02-08 DIAGNOSIS — E1149 Type 2 diabetes mellitus with other diabetic neurological complication: Secondary | ICD-10-CM | POA: Diagnosis not present

## 2012-02-08 DIAGNOSIS — N189 Chronic kidney disease, unspecified: Secondary | ICD-10-CM | POA: Diagnosis not present

## 2012-02-08 DIAGNOSIS — E785 Hyperlipidemia, unspecified: Secondary | ICD-10-CM | POA: Diagnosis not present

## 2012-02-08 DIAGNOSIS — E1142 Type 2 diabetes mellitus with diabetic polyneuropathy: Secondary | ICD-10-CM | POA: Diagnosis not present

## 2012-02-08 DIAGNOSIS — F17200 Nicotine dependence, unspecified, uncomplicated: Secondary | ICD-10-CM | POA: Diagnosis not present

## 2012-02-08 DIAGNOSIS — E669 Obesity, unspecified: Secondary | ICD-10-CM | POA: Diagnosis not present

## 2012-02-08 DIAGNOSIS — E1129 Type 2 diabetes mellitus with other diabetic kidney complication: Secondary | ICD-10-CM | POA: Diagnosis not present

## 2012-02-08 DIAGNOSIS — E1165 Type 2 diabetes mellitus with hyperglycemia: Secondary | ICD-10-CM | POA: Diagnosis not present
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
## 2012-03-28 DIAGNOSIS — E1169 Type 2 diabetes mellitus with other specified complication: Secondary | ICD-10-CM | POA: Diagnosis not present

## 2012-03-28 DIAGNOSIS — E785 Hyperlipidemia, unspecified: Secondary | ICD-10-CM | POA: Diagnosis not present

## 2012-03-28 DIAGNOSIS — N189 Chronic kidney disease, unspecified: Secondary | ICD-10-CM | POA: Diagnosis not present

## 2012-03-28 DIAGNOSIS — E1149 Type 2 diabetes mellitus with other diabetic neurological complication: Secondary | ICD-10-CM | POA: Diagnosis not present

## 2012-03-28 DIAGNOSIS — F17200 Nicotine dependence, unspecified, uncomplicated: Secondary | ICD-10-CM | POA: Diagnosis not present

## 2012-03-28 DIAGNOSIS — E669 Obesity, unspecified: Secondary | ICD-10-CM | POA: Diagnosis not present

## 2012-03-28 DIAGNOSIS — E1142 Type 2 diabetes mellitus with diabetic polyneuropathy: Secondary | ICD-10-CM | POA: Diagnosis not present

## 2012-03-28 DIAGNOSIS — E1165 Type 2 diabetes mellitus with hyperglycemia: Secondary | ICD-10-CM | POA: Diagnosis not present

## 2012-03-28 DIAGNOSIS — E1129 Type 2 diabetes mellitus with other diabetic kidney complication: Secondary | ICD-10-CM | POA: Diagnosis not present

## 2012-04-16 DIAGNOSIS — F3131 Bipolar disorder, current episode depressed, mild: Secondary | ICD-10-CM | POA: Diagnosis not present

## 2012-04-18 DIAGNOSIS — F3131 Bipolar disorder, current episode depressed, mild: Secondary | ICD-10-CM | POA: Diagnosis not present

## 2012-05-07 ENCOUNTER — Encounter: Payer: Self-pay | Admitting: Sports Medicine

## 2012-05-07 ENCOUNTER — Ambulatory Visit (INDEPENDENT_AMBULATORY_CARE_PROVIDER_SITE_OTHER): Payer: Managed Care, Other (non HMO) | Admitting: Sports Medicine

## 2012-05-07 VITALS — BP 132/74 | HR 94 | Temp 97.7°F | Ht 64.0 in | Wt 223.0 lb

## 2012-05-07 DIAGNOSIS — R030 Elevated blood-pressure reading, without diagnosis of hypertension: Secondary | ICD-10-CM | POA: Diagnosis not present

## 2012-05-07 DIAGNOSIS — F17200 Nicotine dependence, unspecified, uncomplicated: Secondary | ICD-10-CM | POA: Diagnosis not present

## 2012-05-07 DIAGNOSIS — E669 Obesity, unspecified: Secondary | ICD-10-CM | POA: Diagnosis not present

## 2012-05-07 DIAGNOSIS — IMO0001 Reserved for inherently not codable concepts without codable children: Secondary | ICD-10-CM

## 2012-05-07 DIAGNOSIS — E1149 Type 2 diabetes mellitus with other diabetic neurological complication: Secondary | ICD-10-CM | POA: Diagnosis not present

## 2012-05-07 DIAGNOSIS — G4730 Sleep apnea, unspecified: Secondary | ICD-10-CM

## 2012-05-07 MED ORDER — TRAMADOL HCL 50 MG PO TABS: 50.0000 mg | ORAL_TABLET | Freq: Three times a day (TID) | ORAL | Status: DC | PRN

## 2012-05-07 NOTE — Patient Instructions (Addendum)
Follow up in 3 months Sleep study

## 2012-05-16 ENCOUNTER — Telehealth: Payer: Self-pay | Admitting: *Deleted

## 2012-05-16 DIAGNOSIS — G4730 Sleep apnea, unspecified: Secondary | ICD-10-CM

## 2012-05-16 NOTE — Telephone Encounter (Signed)
[name] with [name] left a message on the Physician/Pharmacy line.  [name]'s insurance will not cover a split night sleep study, they will only cover a NPSG which is a diagnostic baseline for sleep apnea.  If this is okay, please enter a new order for a NPSG and cancel the split night.  [name]

## 2012-05-21 ENCOUNTER — Encounter: Payer: Self-pay | Admitting: Home Health Services

## 2012-05-22 DIAGNOSIS — G4730 Sleep apnea, unspecified: Secondary | ICD-10-CM | POA: Insufficient documentation

## 2012-05-22 NOTE — Telephone Encounter (Signed)
New order completed.

## 2012-05-23 ENCOUNTER — Encounter: Payer: Self-pay | Admitting: Home Health Services

## 2012-05-29 DIAGNOSIS — IMO0001 Reserved for inherently not codable concepts without codable children: Secondary | ICD-10-CM | POA: Insufficient documentation

## 2012-05-29 NOTE — Assessment & Plan Note (Signed)
Encouraged TLC

## 2012-05-29 NOTE — Assessment & Plan Note (Signed)
Per history Will order sleep study to assess for need of CPAP

## 2012-05-29 NOTE — Progress Notes (Signed)
  [name]  Patient name: [name] MRN [id number]  Date of birth: 06-28-1971  CC & HPI:  [name] is a 41 y.o. female presenting today to establish with her new doctor:  She reports not wanting to check blood today, Continues to smoke, No recent TIA like symptoms BP is normal at home Attempting to work on weight loss but has not been a priority  ROS:  No syncope, presyncope, orthopnea, dyspnea on exertion, chest pain  Pertinent History Reviewed:  Medical & Surgical Hx:  Reviewed: Significant for hx of TIA, syncopal episode, mood disorder Medications: Reviewed & Updated - see associated section Social History: Reviewed -  reports that she has been smoking Cigarettes.  She has a 20 pack-year smoking history. She does not have any smokeless tobacco history on file.  Objective Findings:  Vitals:  Filed Vitals:   05/07/12 1500  BP: 132/74  Pulse: 94  Temp: 97.7 F (36.5 C)    PE: GENERAL:  Adult AA female. In no discomfort; no respiratory distress. PSYCH: Alert and appropriately interactive; Insight:Fair and Lacking   H&N: AT/[place], trachea midline EENT:  MMM, no scleral icterus, EOMi HEART: RRR, S1/S2 heard, no murmur LUNGS: CTA B, no wheezes, no crackles EXTREMITIES: Moves all 4 extremities spontaneously, warm well perfused, no edema, bilateral DP and PT pulses 2/4.      Assessment & Plan:

## 2012-05-29 NOTE — Assessment & Plan Note (Signed)
Will need full labs at follow up Pt reports having all meds

## 2012-05-29 NOTE — Assessment & Plan Note (Signed)
BP elevated to 161/77 at initial presentation; corrected to 132/74. Continue to monitor

## 2012-05-29 NOTE — Assessment & Plan Note (Signed)
Refill Tramadol.  Caution advised with hx of ?seizures.  Pt has tolerated well in past

## 2012-05-29 NOTE — Assessment & Plan Note (Signed)
Continues to smoke.

## 2012-05-30 DIAGNOSIS — F3175 Bipolar disorder, in partial remission, most recent episode depressed: Secondary | ICD-10-CM | POA: Diagnosis not present

## 2012-06-03 ENCOUNTER — Ambulatory Visit (HOSPITAL_BASED_OUTPATIENT_CLINIC_OR_DEPARTMENT_OTHER): Payer: Managed Care, Other (non HMO)

## 2012-06-03 ENCOUNTER — Encounter (HOSPITAL_BASED_OUTPATIENT_CLINIC_OR_DEPARTMENT_OTHER): Payer: Managed Care, Other (non HMO)

## 2012-06-18 DIAGNOSIS — E1149 Type 2 diabetes mellitus with other diabetic neurological complication: Secondary | ICD-10-CM | POA: Diagnosis not present

## 2012-07-07 IMAGING — CR DG PELVIS 1-2V
1 series · 1 of 1 positions shown · non-contrast
Comparison: None.

CLINICAL DATA: Fall with loss of consciousness.

PELVIS - 1-2 VIEW

[t pelvis a.p.]
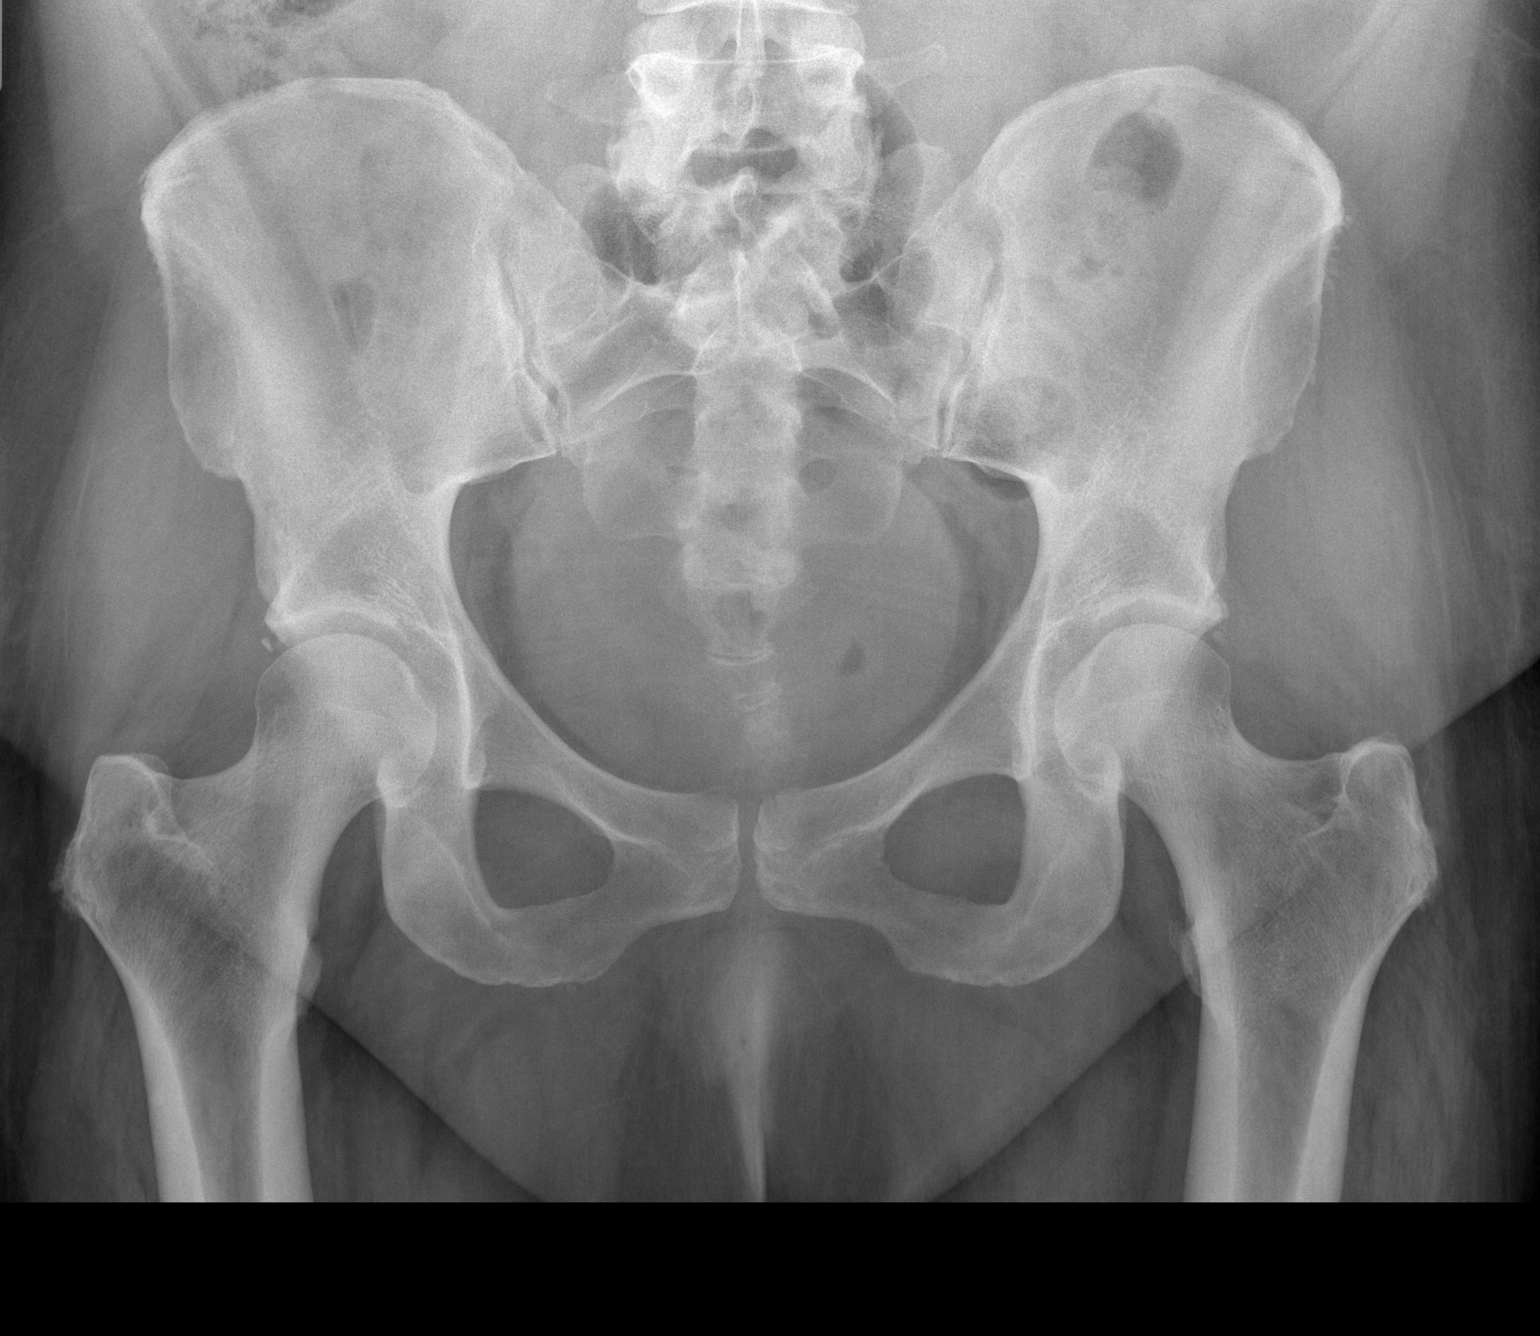

[1 of 1 positions shown; findings below may reference images not displayed]

FINDINGS: There is no evidence of fracture or dislocation.  There
is no evidence of arthropathy or other focal bone abnormality.
Soft tissues are unremarkable.
IMPRESSION: Negative examination.

## 2012-07-07 IMAGING — CT CT HEAD W/O CM
3 of 5 series · 16 of 47 positions shown, 19 images · non-contrast
Comparison: Head CTs 06/22/2011 and earlier.

CT HEAD

CLINICAL DATA: 40-year-old female status post fall.  Dizziness,
syncope.  Pain.

CT HEAD WITHOUT CONTRAST
CT CERVICAL SPINE WITHOUT CONTRAST
TECHNIQUE: Multidetector CT imaging of the head and cervical spine
was performed following the standard protocol without intravenous
contrast.  Multiplanar CT image reconstructions of the cervical
spine were also generated.

[Series 602: sag · sagittal · 0.31mm/px · 3 of 32 slices shown]
[im 11/32  brain]
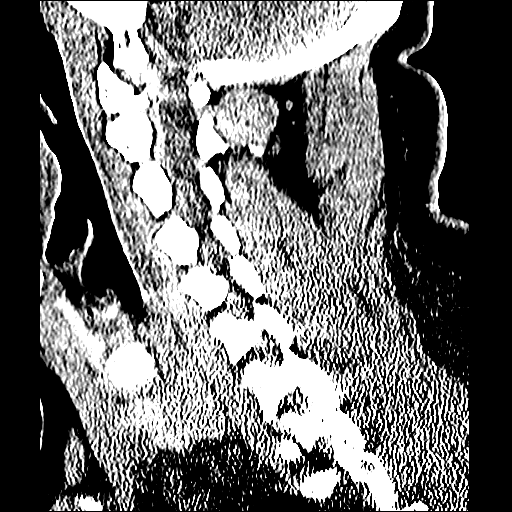
[im 16/32  brain]
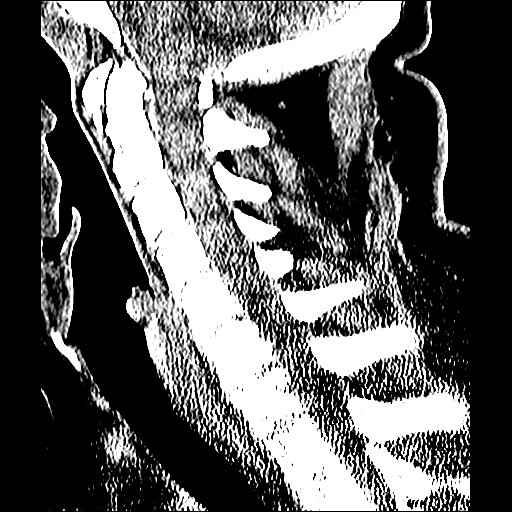
[im 21/32  brain]
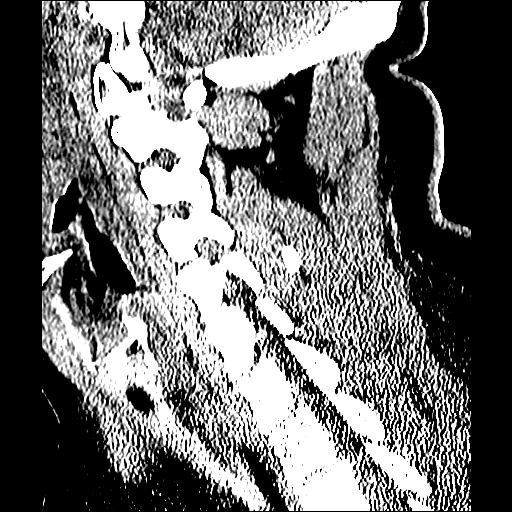

[Series 603: cor · coronal · 0.31mm/px · 3 of 47 slices shown]
[im 16/47  brain]
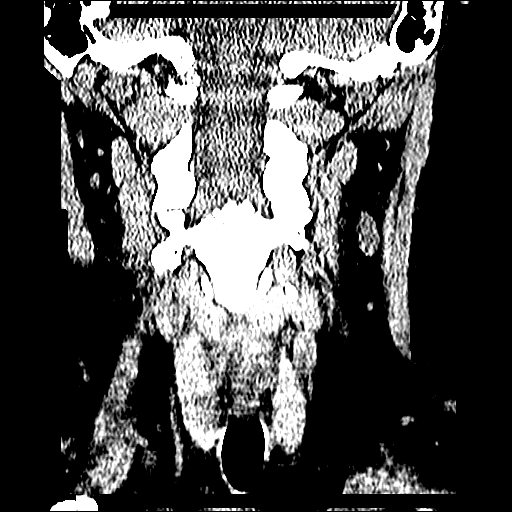
[im 21/47  brain]
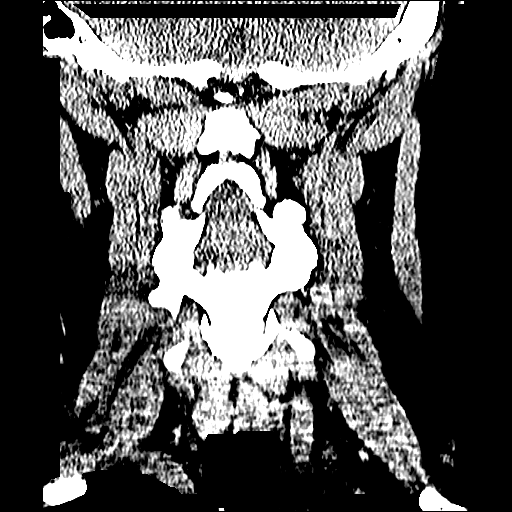
[im 26/47  brain]
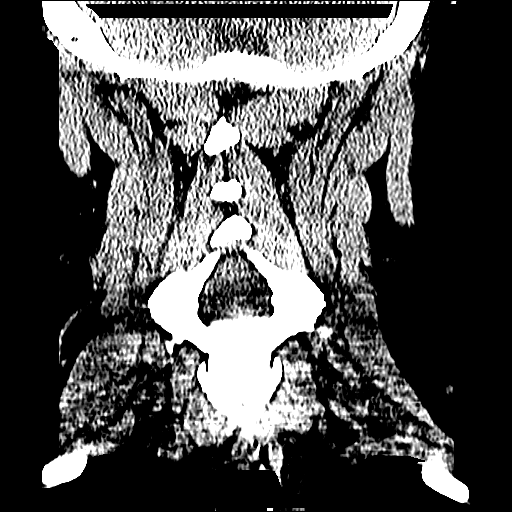

[Series 604: axials · axial · 0.31mm/px · z∈[-331,-201]mm · 10 of 92 slices shown, 13 images]
[im 8/92  brain]
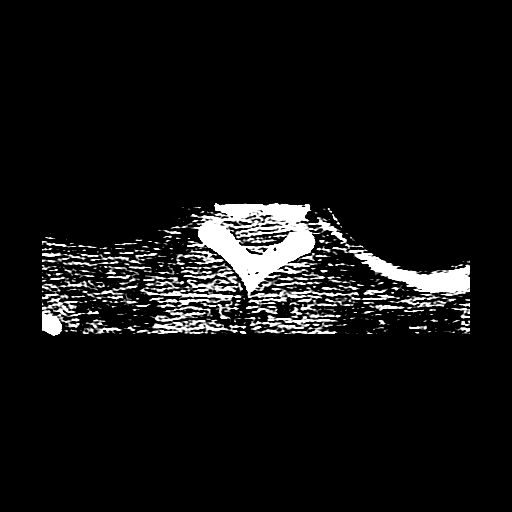
[im 8/92  bone]
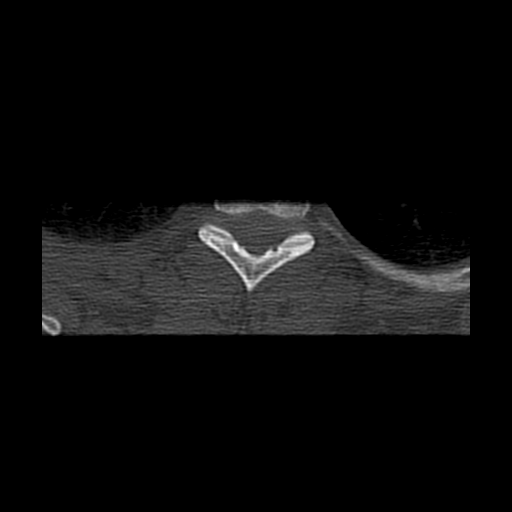
[im 15/92  brain]
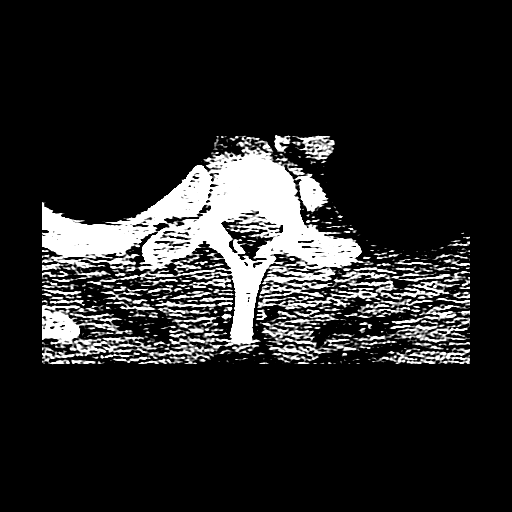
[im 29/92  brain]
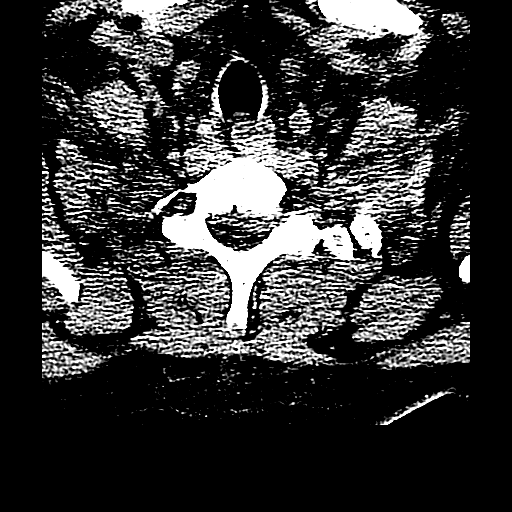
[im 36/92  brain]
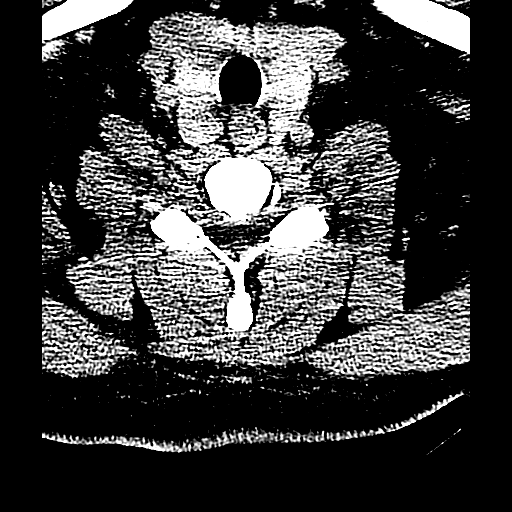
[im 43/92  brain]
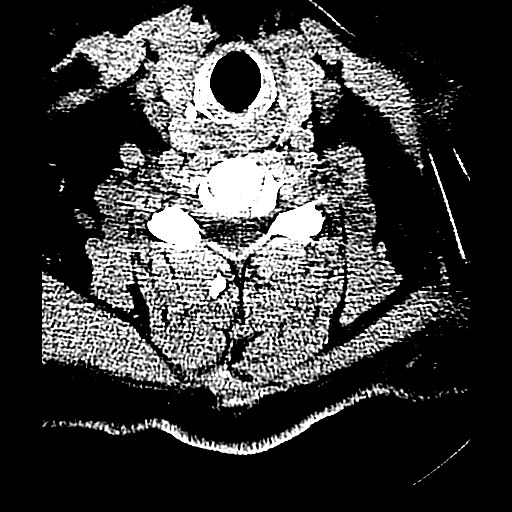
[im 43/92  bone]
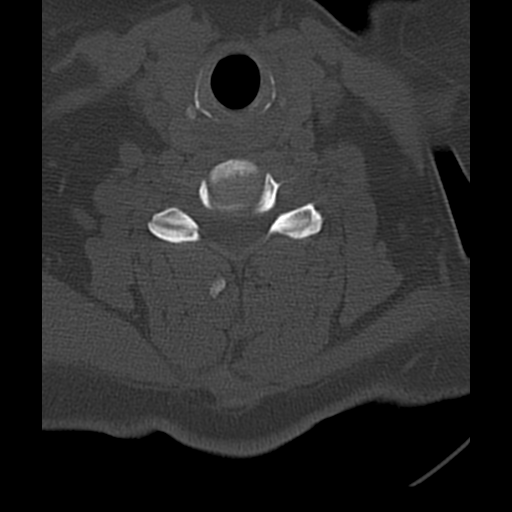
[im 50/92  brain]
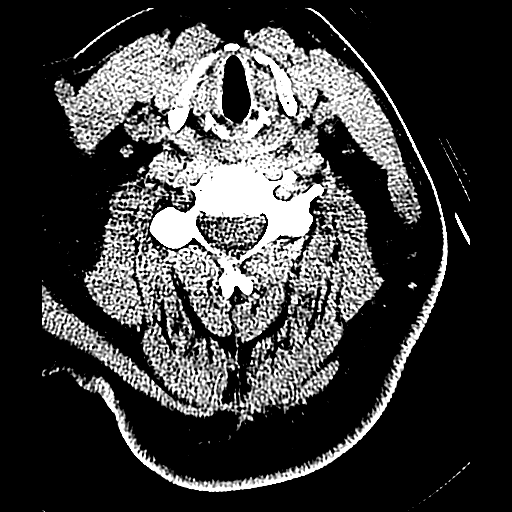
[im 57/92  brain]
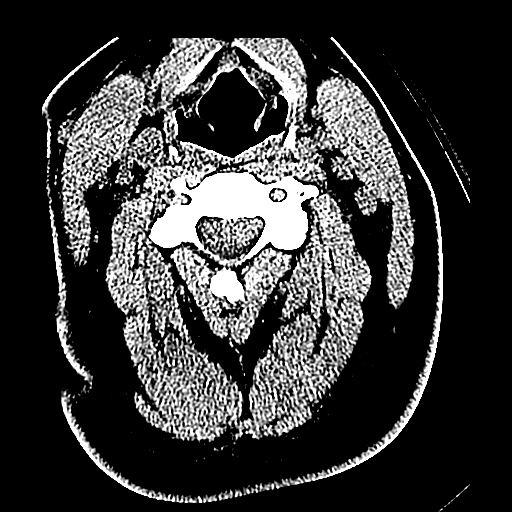
[im 71/92  brain]
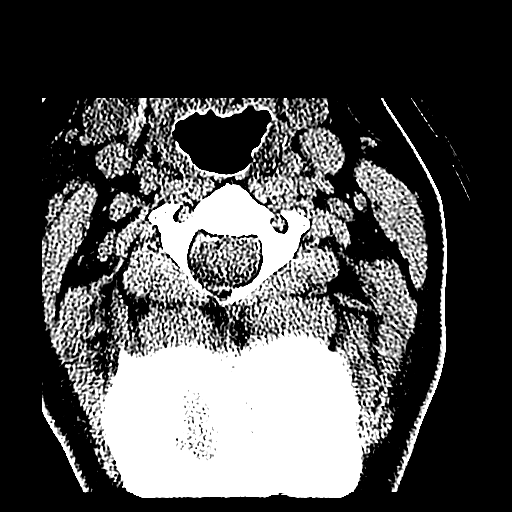
[im 78/92  brain]
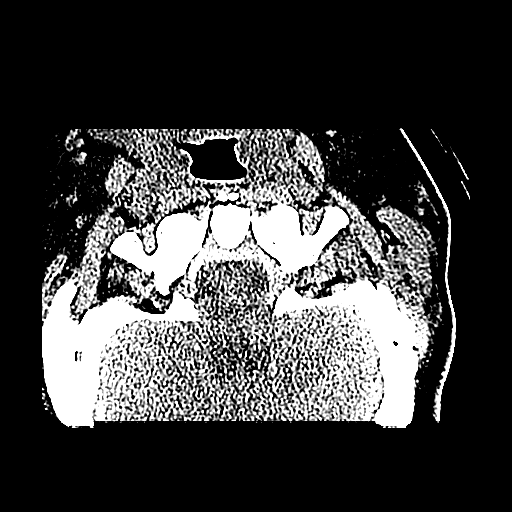
[im 78/92  bone]
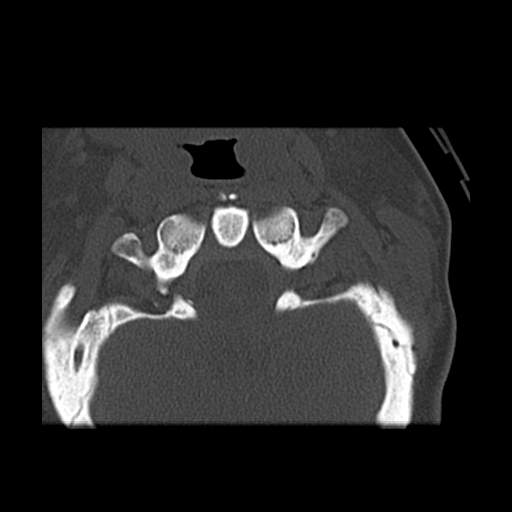
[im 85/92  brain]
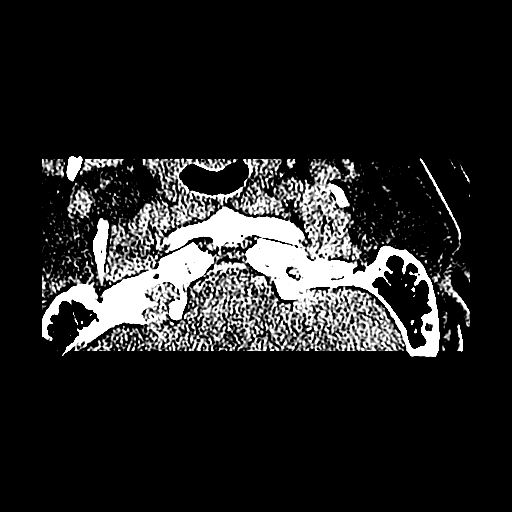

[16 of 47 positions shown; findings below may reference images not displayed]

FINDINGS: Chronic retained metal foreign body along the left
lateral convexity.  No scalp hematoma. Visualized orbit soft
tissues are within normal limits.  Visualized paranasal sinuses and
mastoids are clear.  No acute osseous abnormality identified.

Cerebral volume is within normal limits for age.  No midline shift,
ventriculomegaly, mass effect, evidence of mass lesion,
intracranial hemorrhage or evidence of cortically based acute
infarction.  Gray-white matter differentiation is within normal
limits throughout the brain.  Dominant left vertebral artery
suspected. No suspicious intracranial vascular hyperdensity.
IMPRESSION: Stable and normal noncontrast CT appearance of the brain.
Cervical findings are below.

CT CERVICAL SPINE
FINDINGS: Straightening of cervical lordosis. Visualized skull base
is intact.  No atlanto-occipital dissociation.  Cervicothoracic
junction alignment is within normal limits.  Bilateral posterior
element alignment is within normal limits.  Degenerative changes at
the inferior C1 ring anteriorly related to the longus Aujla
muscles.  No acute cervical fracture identified.  Grossly negative
lung apices. Visualized paraspinal soft tissues are within normal
limits.
IMPRESSION: No acute fracture or listhesis identified in the cervical spine.
Ligamentous injury is not excluded.

## 2012-07-07 IMAGING — CR DG CHEST 2V
1 series · 1 of 1 positions shown · non-contrast
Comparison: 03/19/2010

CLINICAL DATA: Fall with loss of consciousness

CHEST - 2 VIEW

[view not recorded]
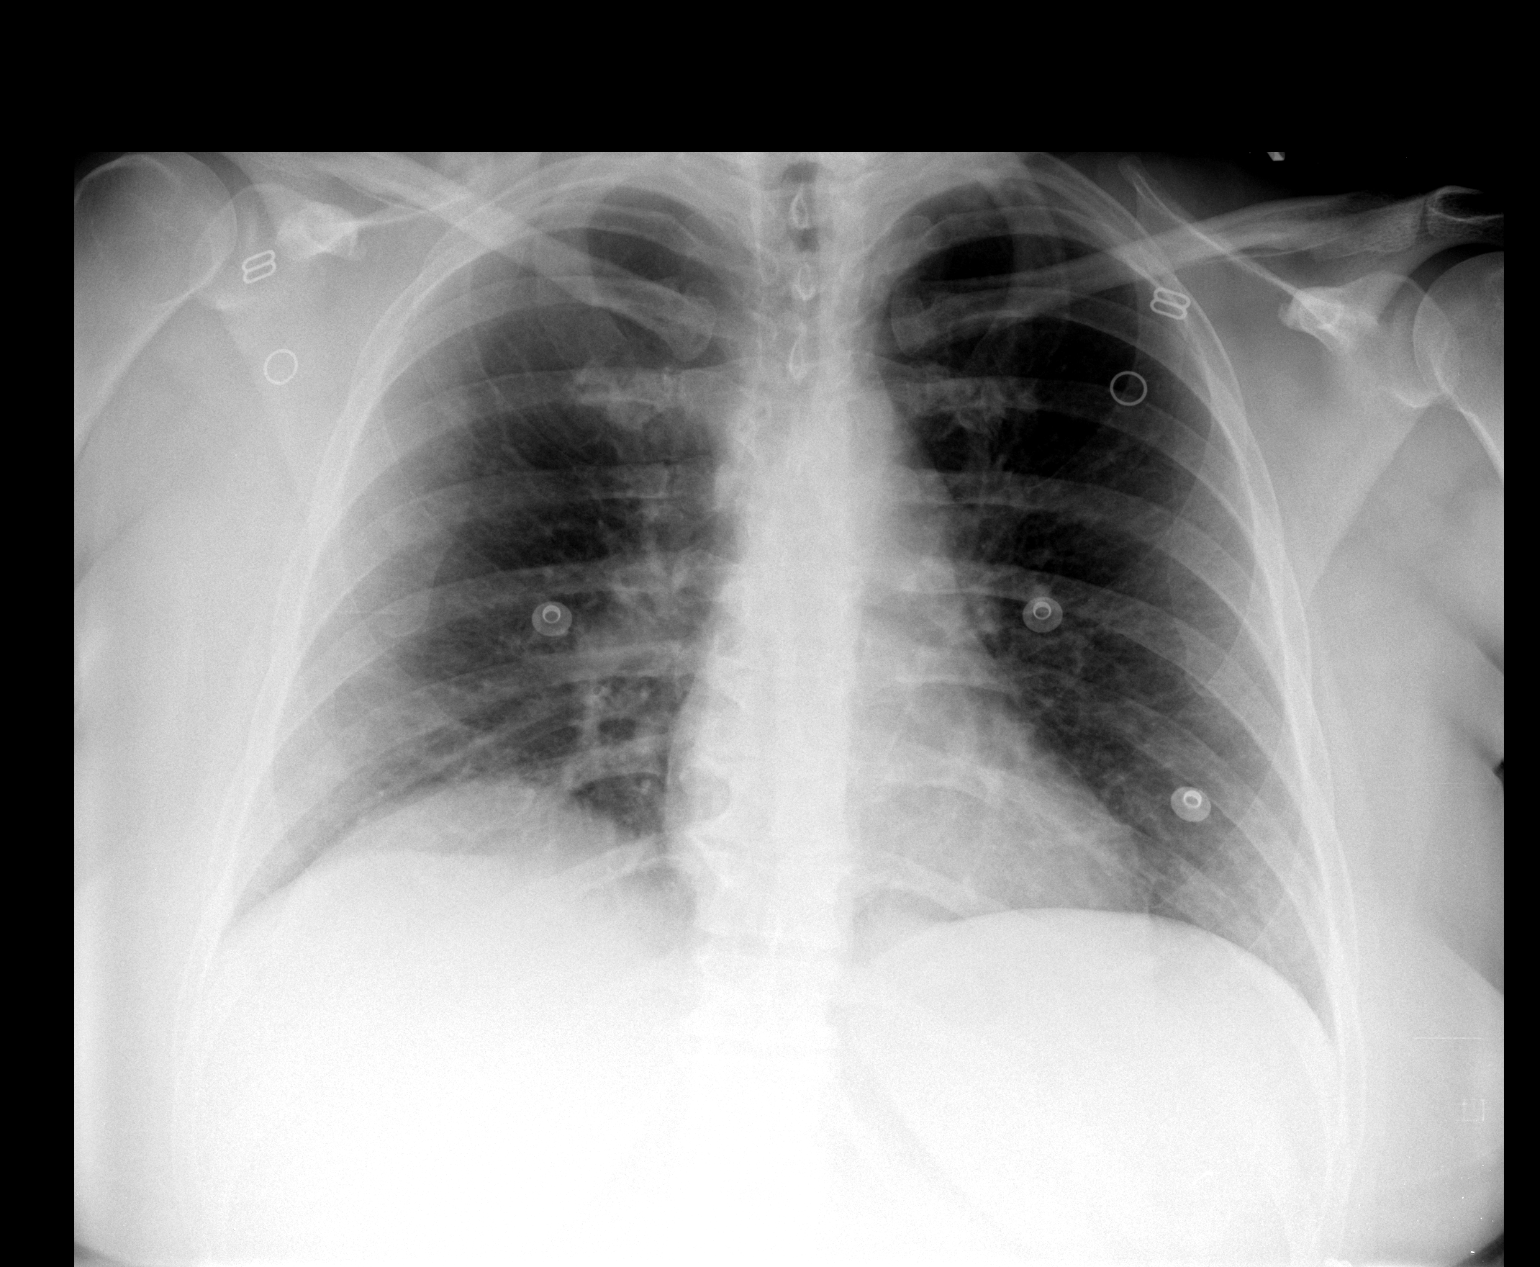

[1 of 1 positions shown; findings below may reference images not displayed]

FINDINGS: The heart size and mediastinal contours are within normal
limits.  Both lungs are clear. Mild multilevel spondylosis within
the thoracic spine.
IMPRESSION: No acute findings.

## 2012-07-10 DIAGNOSIS — N3000 Acute cystitis without hematuria: Secondary | ICD-10-CM | POA: Diagnosis not present

## 2012-07-10 DIAGNOSIS — N189 Chronic kidney disease, unspecified: Secondary | ICD-10-CM | POA: Diagnosis not present

## 2012-07-10 DIAGNOSIS — E1142 Type 2 diabetes mellitus with diabetic polyneuropathy: Secondary | ICD-10-CM | POA: Diagnosis not present

## 2012-07-10 DIAGNOSIS — E1129 Type 2 diabetes mellitus with other diabetic kidney complication: Secondary | ICD-10-CM | POA: Diagnosis not present

## 2012-07-10 DIAGNOSIS — E669 Obesity, unspecified: Secondary | ICD-10-CM | POA: Diagnosis not present

## 2012-07-10 DIAGNOSIS — E1149 Type 2 diabetes mellitus with other diabetic neurological complication: Secondary | ICD-10-CM | POA: Diagnosis not present

## 2012-07-10 DIAGNOSIS — F17200 Nicotine dependence, unspecified, uncomplicated: Secondary | ICD-10-CM | POA: Diagnosis not present

## 2012-07-10 DIAGNOSIS — E785 Hyperlipidemia, unspecified: Secondary | ICD-10-CM | POA: Diagnosis not present

## 2012-08-10 DIAGNOSIS — E669 Obesity, unspecified: Secondary | ICD-10-CM | POA: Diagnosis not present

## 2012-08-10 DIAGNOSIS — E1142 Type 2 diabetes mellitus with diabetic polyneuropathy: Secondary | ICD-10-CM | POA: Diagnosis not present

## 2012-08-10 DIAGNOSIS — E1129 Type 2 diabetes mellitus with other diabetic kidney complication: Secondary | ICD-10-CM | POA: Diagnosis not present

## 2012-08-10 DIAGNOSIS — E1149 Type 2 diabetes mellitus with other diabetic neurological complication: Secondary | ICD-10-CM | POA: Diagnosis not present

## 2012-08-10 DIAGNOSIS — N189 Chronic kidney disease, unspecified: Secondary | ICD-10-CM | POA: Diagnosis not present

## 2012-08-10 DIAGNOSIS — E1165 Type 2 diabetes mellitus with hyperglycemia: Secondary | ICD-10-CM | POA: Diagnosis not present

## 2012-08-10 DIAGNOSIS — F17200 Nicotine dependence, unspecified, uncomplicated: Secondary | ICD-10-CM | POA: Diagnosis not present

## 2012-08-28 ENCOUNTER — Other Ambulatory Visit: Payer: Self-pay | Admitting: *Deleted

## 2012-08-30 ENCOUNTER — Other Ambulatory Visit: Payer: Self-pay | Admitting: *Deleted

## 2012-08-30 MED ORDER — TRAMADOL HCL 50 MG PO TABS: 50.0000 mg | ORAL_TABLET | Freq: Three times a day (TID) | ORAL | Status: DC | PRN

## 2012-09-12 DIAGNOSIS — E1149 Type 2 diabetes mellitus with other diabetic neurological complication: Secondary | ICD-10-CM | POA: Diagnosis not present

## 2012-09-12 DIAGNOSIS — N189 Chronic kidney disease, unspecified: Secondary | ICD-10-CM | POA: Diagnosis not present

## 2012-09-24 DIAGNOSIS — F3131 Bipolar disorder, current episode depressed, mild: Secondary | ICD-10-CM | POA: Diagnosis not present

## 2012-09-26 ENCOUNTER — Encounter: Payer: Self-pay | Admitting: *Deleted

## 2012-09-28 DIAGNOSIS — F3131 Bipolar disorder, current episode depressed, mild: Secondary | ICD-10-CM | POA: Diagnosis not present

## 2012-10-15 ENCOUNTER — Other Ambulatory Visit: Payer: Self-pay | Admitting: *Deleted

## 2012-10-16 DIAGNOSIS — E1149 Type 2 diabetes mellitus with other diabetic neurological complication: Secondary | ICD-10-CM | POA: Diagnosis not present

## 2012-10-16 DIAGNOSIS — E1142 Type 2 diabetes mellitus with diabetic polyneuropathy: Secondary | ICD-10-CM | POA: Diagnosis not present

## 2012-10-16 DIAGNOSIS — E1129 Type 2 diabetes mellitus with other diabetic kidney complication: Secondary | ICD-10-CM | POA: Diagnosis not present

## 2012-10-16 DIAGNOSIS — F17200 Nicotine dependence, unspecified, uncomplicated: Secondary | ICD-10-CM | POA: Diagnosis not present

## 2012-10-16 DIAGNOSIS — N189 Chronic kidney disease, unspecified: Secondary | ICD-10-CM | POA: Diagnosis not present

## 2012-10-16 DIAGNOSIS — E669 Obesity, unspecified: Secondary | ICD-10-CM | POA: Diagnosis not present

## 2012-10-16 MED ORDER — GABAPENTIN 300 MG PO CAPS: 300.0000 mg | ORAL_CAPSULE | Freq: Three times a day (TID) | ORAL | Status: DC

## 2012-11-14 DIAGNOSIS — F3131 Bipolar disorder, current episode depressed, mild: Secondary | ICD-10-CM | POA: Diagnosis not present

## 2012-12-10 ENCOUNTER — Ambulatory Visit (INDEPENDENT_AMBULATORY_CARE_PROVIDER_SITE_OTHER): Payer: Managed Care, Other (non HMO) | Admitting: Family Medicine

## 2012-12-10 ENCOUNTER — Encounter: Payer: Self-pay | Admitting: Family Medicine

## 2012-12-10 VITALS — BP 134/72 | HR 103 | Temp 98.0°F | Ht 64.0 in | Wt 223.0 lb

## 2012-12-10 DIAGNOSIS — R609 Edema, unspecified: Secondary | ICD-10-CM

## 2012-12-10 DIAGNOSIS — M7989 Other specified soft tissue disorders: Secondary | ICD-10-CM

## 2012-12-10 MED ORDER — TRAMADOL HCL 50 MG PO TABS: 100.0000 mg | ORAL_TABLET | Freq: Four times a day (QID) | ORAL | Status: DC | PRN

## 2012-12-10 NOTE — Patient Instructions (Addendum)
It was nice to meet you today, [name]. Please go to [facility] for venous doppler and X-ray of left foot/ankle. For pain, take Tramadol 1-2 tablets every 9 hours as needed.  Watch for drowsiness. Schedule follow up appointment with me in 2 days. If you develop worsening pain, redness, swelling or associated shortness of breath, please go to ER.

## 2012-12-10 NOTE — Progress Notes (Addendum)
  Subjective:    Patient ID: [name], female    DOB: 1971/05/16, 42 y.o.   MRN: [id number]  HPI  Left foot and ankle swelling:  Started last Monday, sudden onset.  She was mowing the lawn (riding mower) and noticed swelling shortly after.  She thinks she may been bitten by something, but does not recall any trauma or injury.  However, there is a small scab on her foot.  She has tried soaking in Epsom salts.  Ankle is painful - takes 8 Motrin tablets daily and Tramadol BID.    She has diabetes, CBG 200 range, managed by endocrinologist.  No hx of gout.  She says she had a DVT in 2009 left posterior knee, she was on blood thinner for one month.  I did not see records of this.  Tetanus up to date.  Denies any SOB, chest pain.  She complains of diarrhea, but no abdominal pain, dysuria, fever, chills, nausea/vomiting.  Review of Systems Per HPI    Objective:   Physical Exam  Constitutional: She appears well-nourished. No distress.  Cardiovascular: Normal rate.   Pulmonary/Chest: Effort normal.  Musculoskeletal:  3+ pitting edema left foot extends to shin; no redness, but skin is warm, no calf pain or tenderness; no abscess or fluctuance; small scab on dorsum of left foot RT foot/ankle normal exam      Assessment & Plan:

## 2012-12-11 ENCOUNTER — Encounter: Payer: Self-pay | Admitting: Family Medicine

## 2012-12-11 ENCOUNTER — Telehealth: Payer: Self-pay | Admitting: *Deleted

## 2012-12-11 ENCOUNTER — Ambulatory Visit
Admission: RE | Admit: 2012-12-11 | Discharge: 2012-12-11 | Disposition: A | Discharge status: Home / Self Care | Payer: Managed Care, Other (non HMO) | Source: Ambulatory Visit | Attending: Family Medicine | Admitting: Family Medicine

## 2012-12-11 ENCOUNTER — Other Ambulatory Visit: Payer: Self-pay | Admitting: Family Medicine

## 2012-12-11 ENCOUNTER — Ambulatory Visit (HOSPITAL_COMMUNITY)
Admission: RE | Admit: 2012-12-11 | Discharge: 2012-12-11 | Disposition: A | Discharge status: Home / Self Care | Payer: Managed Care, Other (non HMO) | Source: Ambulatory Visit | Attending: Family Medicine | Admitting: Family Medicine

## 2012-12-11 ENCOUNTER — Telehealth: Payer: Self-pay | Admitting: Family Medicine

## 2012-12-11 DIAGNOSIS — M7989 Other specified soft tissue disorders: Secondary | ICD-10-CM

## 2012-12-11 DIAGNOSIS — R609 Edema, unspecified: Secondary | ICD-10-CM

## 2012-12-11 DIAGNOSIS — S82899A Other fracture of unspecified lower leg, initial encounter for closed fracture: Secondary | ICD-10-CM | POA: Diagnosis not present

## 2012-12-11 DIAGNOSIS — M79609 Pain in unspecified limb: Secondary | ICD-10-CM | POA: Insufficient documentation

## 2012-12-11 DIAGNOSIS — S82892A Other fracture of left lower leg, initial encounter for closed fracture: Secondary | ICD-10-CM

## 2012-12-11 DIAGNOSIS — X58XXXA Exposure to other specified factors, initial encounter: Secondary | ICD-10-CM | POA: Insufficient documentation

## 2012-12-11 DIAGNOSIS — K5909 Other constipation: Secondary | ICD-10-CM

## 2012-12-11 NOTE — Progress Notes (Signed)
VASCULAR LAB PRELIMINARY  PRELIMINARY  PRELIMINARY  PRELIMINARY  Left lower extremity venous duplex completed.    Preliminary report:  Left:  No evidence of DVT, superficial thrombosis, or Baker's cyst.  [name], RVS 12/11/2012, 5:10 PM

## 2012-12-11 NOTE — Telephone Encounter (Signed)
Spoke with patient and informed her of appointment set up for Thursday 6/11 @ 3:15pm with Dr. Prince Rome at [facility] [address]

## 2012-12-11 NOTE — Assessment & Plan Note (Signed)
Concern for DVT given prior hx of DVT left LE vs. Foreign body vs. Cellulitis.  Foot/ankle X-ray to rule out foreign body. LE doppler to rule out DVT. No evidence of systemic infection today, will hold off on antibiotics for now. Increased Tramadol dose as needed for pain. Follow up with me in 2 days Red flags reviewed with patient and per AVS.

## 2012-12-11 NOTE — Telephone Encounter (Signed)
Called patient and LVM.  There is a small fracture of medial malleolus.  I recommended RICE today.  She has an appointment with me tomorrow.  Will refer to Ortho then.  Also awaiting LE venous doppler results.

## 2012-12-12 ENCOUNTER — Ambulatory Visit: Payer: Managed Care, Other (non HMO) | Admitting: Family Medicine

## 2012-12-12 ENCOUNTER — Ambulatory Visit (HOSPITAL_COMMUNITY): Payer: Managed Care, Other (non HMO)

## 2012-12-17 DIAGNOSIS — F3131 Bipolar disorder, current episode depressed, mild: Secondary | ICD-10-CM | POA: Diagnosis not present

## 2013-01-10 ENCOUNTER — Other Ambulatory Visit: Payer: Self-pay | Admitting: *Deleted

## 2013-01-14 MED ORDER — TRAMADOL HCL 50 MG PO TABS: 100.0000 mg | ORAL_TABLET | Freq: Four times a day (QID) | ORAL | Status: DC | PRN

## 2013-01-23 DIAGNOSIS — E1129 Type 2 diabetes mellitus with other diabetic kidney complication: Secondary | ICD-10-CM | POA: Diagnosis not present

## 2013-01-23 DIAGNOSIS — E1165 Type 2 diabetes mellitus with hyperglycemia: Secondary | ICD-10-CM | POA: Diagnosis not present

## 2013-01-23 DIAGNOSIS — F17200 Nicotine dependence, unspecified, uncomplicated: Secondary | ICD-10-CM | POA: Diagnosis not present

## 2013-01-23 DIAGNOSIS — N189 Chronic kidney disease, unspecified: Secondary | ICD-10-CM | POA: Diagnosis not present

## 2013-01-23 DIAGNOSIS — E669 Obesity, unspecified: Secondary | ICD-10-CM | POA: Diagnosis not present

## 2013-01-23 DIAGNOSIS — E1149 Type 2 diabetes mellitus with other diabetic neurological complication: Secondary | ICD-10-CM | POA: Diagnosis not present

## 2013-01-23 DIAGNOSIS — M25579 Pain in unspecified ankle and joints of unspecified foot: Secondary | ICD-10-CM | POA: Diagnosis not present

## 2013-01-23 DIAGNOSIS — E1142 Type 2 diabetes mellitus with diabetic polyneuropathy: Secondary | ICD-10-CM | POA: Diagnosis not present

## 2013-01-31 ENCOUNTER — Telehealth: Payer: Self-pay | Admitting: *Deleted

## 2013-01-31 NOTE — Telephone Encounter (Signed)
Pt called and voice mail left regarding scheduling of yearly diabetic check and A1C , letter also sent to pt home. Elizabeth Llewyn Heap, RN-BSN   

## 2013-02-04 ENCOUNTER — Encounter: Payer: Self-pay | Admitting: Sports Medicine

## 2013-02-04 ENCOUNTER — Ambulatory Visit (INDEPENDENT_AMBULATORY_CARE_PROVIDER_SITE_OTHER): Payer: Managed Care, Other (non HMO) | Admitting: Sports Medicine

## 2013-02-04 ENCOUNTER — Other Ambulatory Visit (HOSPITAL_COMMUNITY)
Admission: RE | Admit: 2013-02-04 | Discharge: 2013-02-04 | Disposition: A | Discharge status: Home / Self Care | Payer: Managed Care, Other (non HMO) | Source: Ambulatory Visit | Attending: Sports Medicine | Admitting: Sports Medicine

## 2013-02-04 VITALS — BP 107/62 | HR 114 | Temp 99.4°F | Ht 64.0 in | Wt 225.0 lb

## 2013-02-04 DIAGNOSIS — Z01419 Encounter for gynecological examination (general) (routine) without abnormal findings: Secondary | ICD-10-CM | POA: Insufficient documentation

## 2013-02-04 DIAGNOSIS — E781 Pure hyperglyceridemia: Secondary | ICD-10-CM

## 2013-02-04 DIAGNOSIS — E669 Obesity, unspecified: Secondary | ICD-10-CM

## 2013-02-04 DIAGNOSIS — E1149 Type 2 diabetes mellitus with other diabetic neurological complication: Secondary | ICD-10-CM | POA: Diagnosis not present

## 2013-02-04 DIAGNOSIS — R030 Elevated blood-pressure reading, without diagnosis of hypertension: Secondary | ICD-10-CM

## 2013-02-04 DIAGNOSIS — B373 Candidiasis of vulva and vagina: Secondary | ICD-10-CM

## 2013-02-04 DIAGNOSIS — IMO0001 Reserved for inherently not codable concepts without codable children: Secondary | ICD-10-CM | POA: Diagnosis not present

## 2013-02-04 DIAGNOSIS — K089 Disorder of teeth and supporting structures, unspecified: Secondary | ICD-10-CM

## 2013-02-04 DIAGNOSIS — E1165 Type 2 diabetes mellitus with hyperglycemia: Secondary | ICD-10-CM

## 2013-02-04 DIAGNOSIS — Z124 Encounter for screening for malignant neoplasm of cervix: Secondary | ICD-10-CM

## 2013-02-04 DIAGNOSIS — N898 Other specified noninflammatory disorders of vagina: Secondary | ICD-10-CM | POA: Diagnosis not present

## 2013-02-04 DIAGNOSIS — Z1151 Encounter for screening for human papillomavirus (HPV): Secondary | ICD-10-CM | POA: Insufficient documentation

## 2013-02-04 DIAGNOSIS — F17200 Nicotine dependence, unspecified, uncomplicated: Secondary | ICD-10-CM

## 2013-02-04 LAB — POCT WET PREP (WET MOUNT)
Clue Cells Wet Prep Whiff POC: NEGATIVE
WBC, Wet Prep HPF POC: >20

## 2013-02-04 MED ORDER — FLUCONAZOLE 150 MG PO TABS: 150.0000 mg | ORAL_TABLET | Freq: Once | ORAL | Status: DC

## 2013-02-04 NOTE — Patient Instructions (Addendum)
It was nice to see you today.   Today we discussed:  We have checked a PAP smear for you today.  I will send you a letter with your results     We are checking a Lipid Panel and will send the results to Dr. Tomasa Rand.   Keep taking your other medications from Dr. Sharl Ma   I would encourage you to continue looking into the bariatric surgery options Dr. Sharl Ma suggested  I also agree with having your teeth removed.  Please plan to return to see me in 1 year.  If you need anything prior to seeing me please call the clinic.  Please Bring all medications with you to each appointment.

## 2013-02-04 NOTE — Progress Notes (Signed)
  [facility]  Patient name: [name] MRN [id number]  Date of birth: 11/30/1970  CC & HPI:  [name] is a 42 y.o. female presenting today for follow up of:  # Health Maintenance:  Due for PAP smear. Recommended to have her teeth extracted by Dr. Sharl Ma.   # Psych: Rodman Key - needs A1c and Lipid profile for him  # ENDOCRINOLOGY: followed for Diabetes.  Having minimally improved control but still poorly controlled.  Recommending Bariatric surgery which pt is considering  ------------------------------------------------------------------------------------------------------------------ New Concerns:  none  ------------------------------------------------------------------------------------------------------------------ MEDICATIONS: compliant all of the time   TLC Compliance: compliant most of the time       ROS:  PER HPI  Pertinent History Reviewed:  Medical & Surgical Hx:  Reviewed: Significant for smoker, COPD, hx of TIA Medications: Reviewed & Updated - See associated section in EMR Social History: Reviewed -  reports that she has been smoking Cigarettes.  She has a 20 pack-year smoking history. She does not have any smokeless tobacco history on file.  Objective Findings:  Vitals: BP 107/62  Pulse 114  Temp(Src) 99.4 F (37.4 C) (Oral)  Ht 5\' 4"  (1.626 m)  Wt 225 lb (102.059 kg)  BMI 38.6 kg/m2  PE: GENERAL: Adult  female. In no discomfort; no respiratory distress  PSYCH:  alert and appropriate, good insight   HNEENT:  MMM, poor dentition generally  CARDIO:  RRR, S1/S2 heard, no murmur  LUNGS:  CTA B, no wheezes, no crackles  ABDOMEN:  + bS, soft, non-tender  EXTREM: Moves all 4 spontaneously No lateralization Pulses 1+/4 B in DP and PT Sensation grossly intact No foot lesions noted but painful to touch  GU: Chaperone: CMA  External: no skin lesions, normal appearing genitalia, no discharge  Vagina: no vaginal lesion, vaginal mucosa moist,  thick creamy discharge  Cervix: no cervical lesions, no CMT  Uterus: normal size, shape, consistency  non-tender  Adenexa: no masses, non-tender  TESTS:      SKIN:   NEUROMSK:     Assessment & Plan:

## 2013-02-05 LAB — BASIC METABOLIC PANEL
BUN: 17 mg/dL (ref 6–23)
CO2: 26 mEq/L (ref 19–32)
Calcium: 9.7 mg/dL (ref 8.4–10.5)
Chloride: 97 mEq/L (ref 96–112)
Creat: 0.88 mg/dL (ref 0.50–1.10)
Glucose, Bld: 190 mg/dL — ABNORMAL HIGH (ref 70–99)
Potassium: 4.1 mEq/L (ref 3.5–5.3)
Sodium: 134 mEq/L — ABNORMAL LOW (ref 135–145)

## 2013-02-05 LAB — LIPID PANEL
Cholesterol: 204 mg/dL — ABNORMAL HIGH (ref 0–200)
HDL: 34 mg/dL — ABNORMAL LOW (ref >39)
Total CHOL/HDL Ratio: 6 Ratio
Triglycerides: 658 mg/dL — ABNORMAL HIGH (ref <150)

## 2013-02-06 ENCOUNTER — Encounter: Payer: Self-pay | Admitting: Sports Medicine

## 2013-02-11 MED ORDER — CANAGLIFLOZIN 300 MG PO TABS: 300.0000 mg | ORAL_TABLET | Freq: Every day | ORAL | Status: DC

## 2013-02-11 MED ORDER — INSULIN REGULAR HUMAN (CONC) 500 UNIT/ML ~~LOC~~ SOLN: SUBCUTANEOUS | Status: DC

## 2013-02-11 NOTE — Assessment & Plan Note (Signed)
Good today

## 2013-02-11 NOTE — Assessment & Plan Note (Signed)
Will check Fasted lipid profile today continue Crestor > consider Gemfibrozil & titration of Crestor if elevated Triglycerides

## 2013-02-11 NOTE — Assessment & Plan Note (Signed)
Agree with Bariatric surgery if deemed a good candidate by surgical service

## 2013-02-11 NOTE — Assessment & Plan Note (Signed)
Encouraged to quit. 

## 2013-02-11 NOTE — Assessment & Plan Note (Signed)
Refill tramadol

## 2013-02-11 NOTE — Assessment & Plan Note (Signed)
Agree with removal

## 2013-02-11 NOTE — Assessment & Plan Note (Addendum)
Per Dr. Sharl Ma Med List updated Most recent A1c on 01/23/2013 was 7.7

## 2013-02-27 ENCOUNTER — Other Ambulatory Visit: Payer: Self-pay | Admitting: *Deleted

## 2013-02-27 MED ORDER — TRAMADOL HCL 50 MG PO TABS: 100.0000 mg | ORAL_TABLET | Freq: Four times a day (QID) | ORAL | Status: DC | PRN

## 2013-03-01 ENCOUNTER — Telehealth: Payer: Self-pay | Admitting: Sports Medicine

## 2013-03-01 NOTE — Telephone Encounter (Signed)
Med called in - listed as print on order.no further concerns. Wyatt Haste, RN-BSN

## 2013-03-01 NOTE — Telephone Encounter (Signed)
Pt is at [facility] to pick up her Tramadol and the pharmacy is saying that we have not sent over the fax yet. She would like [place] to send this over as soon as we can JW

## 2013-03-12 ENCOUNTER — Telehealth: Payer: Self-pay | Admitting: Sports Medicine

## 2013-03-12 NOTE — Telephone Encounter (Signed)
Patient is needing a script for a new shower bench that holds 300lbs. Her old one broke and Home care states she would need a script to receive a new one. Phone # [phone]

## 2013-03-13 ENCOUNTER — Telehealth: Payer: Self-pay | Admitting: Sports Medicine

## 2013-03-13 DIAGNOSIS — E781 Pure hyperglyceridemia: Secondary | ICD-10-CM

## 2013-03-13 DIAGNOSIS — R55 Syncope and collapse: Secondary | ICD-10-CM

## 2013-03-13 DIAGNOSIS — R569 Unspecified convulsions: Secondary | ICD-10-CM

## 2013-03-13 NOTE — Telephone Encounter (Signed)
Pt is calling to give the fax number so that Dr. Berline Chough can fax her request for a shower bench. [phone]. JW

## 2013-03-14 ENCOUNTER — Encounter: Payer: Self-pay | Admitting: Sports Medicine

## 2013-03-14 MED ORDER — COLESEVELAM HCL 625 MG PO TABS: 1875.0000 mg | ORAL_TABLET | Freq: Two times a day (BID) | ORAL | Status: DC

## 2013-03-14 MED ORDER — ROSUVASTATIN CALCIUM 40 MG PO TABS: 40.0000 mg | ORAL_TABLET | Freq: Every day | ORAL | Status: DC

## 2013-03-14 NOTE — Telephone Encounter (Signed)
Called and left a message for patient regarding prescriptions for tub bench as well as starting WelChol for her hyperlipidemia.  Also increasing her Crestor.  Encouraged her to start new medication and to schedule sooner follow up appointment.  Will send her results to her psychiatrist as requested.

## 2013-03-14 NOTE — Telephone Encounter (Signed)
Prescription printed for anemia and she greater than 300 pounds.  Prescription given to Dr. Jennette Kettle who will sign and will fax to the number provided.

## 2013-03-14 NOTE — Telephone Encounter (Signed)
LVM informing patient that the RX has been sent ot. Dr, Jennette Kettle did sign this also along with Dr. Berline Chough

## 2013-03-28 DIAGNOSIS — E669 Obesity, unspecified: Secondary | ICD-10-CM | POA: Diagnosis not present

## 2013-03-28 DIAGNOSIS — E1129 Type 2 diabetes mellitus with other diabetic kidney complication: Secondary | ICD-10-CM | POA: Diagnosis not present

## 2013-03-28 DIAGNOSIS — N189 Chronic kidney disease, unspecified: Secondary | ICD-10-CM | POA: Diagnosis not present

## 2013-03-28 DIAGNOSIS — E1149 Type 2 diabetes mellitus with other diabetic neurological complication: Secondary | ICD-10-CM | POA: Diagnosis not present

## 2013-03-28 DIAGNOSIS — E1142 Type 2 diabetes mellitus with diabetic polyneuropathy: Secondary | ICD-10-CM | POA: Diagnosis not present

## 2013-03-28 DIAGNOSIS — F17200 Nicotine dependence, unspecified, uncomplicated: Secondary | ICD-10-CM | POA: Diagnosis not present

## 2013-04-01 ENCOUNTER — Inpatient Hospital Stay (HOSPITAL_COMMUNITY)
Admission: AD | Admit: 2013-04-01 | Discharge: 2013-04-02 | DRG: 313 | Disposition: A | Discharge status: Home / Self Care | Payer: Managed Care, Other (non HMO) | Source: Ambulatory Visit | Attending: Family Medicine | Admitting: Family Medicine

## 2013-04-01 ENCOUNTER — Inpatient Hospital Stay (HOSPITAL_COMMUNITY): Payer: Managed Care, Other (non HMO)

## 2013-04-01 ENCOUNTER — Telehealth: Payer: Self-pay | Admitting: Sports Medicine

## 2013-04-01 ENCOUNTER — Encounter: Payer: Self-pay | Admitting: Family Medicine

## 2013-04-01 ENCOUNTER — Ambulatory Visit (HOSPITAL_COMMUNITY): Payer: Managed Care, Other (non HMO)

## 2013-04-01 ENCOUNTER — Ambulatory Visit (INDEPENDENT_AMBULATORY_CARE_PROVIDER_SITE_OTHER): Payer: Managed Care, Other (non HMO) | Admitting: Sports Medicine

## 2013-04-01 ENCOUNTER — Encounter (HOSPITAL_COMMUNITY): Payer: Self-pay | Admitting: Cardiology

## 2013-04-01 ENCOUNTER — Ambulatory Visit (HOSPITAL_COMMUNITY)
Admission: RE | Admit: 2013-04-01 | Discharge: 2013-04-01 | Disposition: A | Discharge status: Home / Self Care | Payer: Managed Care, Other (non HMO) | Source: Ambulatory Visit | Attending: Sports Medicine | Admitting: Sports Medicine

## 2013-04-01 ENCOUNTER — Other Ambulatory Visit: Payer: Self-pay | Admitting: Sports Medicine

## 2013-04-01 VITALS — BP 125/64 | HR 100 | Temp 97.4°F | Ht 64.0 in | Wt 227.0 lb

## 2013-04-01 DIAGNOSIS — F17200 Nicotine dependence, unspecified, uncomplicated: Secondary | ICD-10-CM | POA: Diagnosis present

## 2013-04-01 DIAGNOSIS — K219 Gastro-esophageal reflux disease without esophagitis: Secondary | ICD-10-CM | POA: Diagnosis present

## 2013-04-01 DIAGNOSIS — Z794 Long term (current) use of insulin: Secondary | ICD-10-CM

## 2013-04-01 DIAGNOSIS — Z79899 Other long term (current) drug therapy: Secondary | ICD-10-CM

## 2013-04-01 DIAGNOSIS — R209 Unspecified disturbances of skin sensation: Secondary | ICD-10-CM | POA: Diagnosis present

## 2013-04-01 DIAGNOSIS — J4489 Other specified chronic obstructive pulmonary disease: Secondary | ICD-10-CM | POA: Diagnosis present

## 2013-04-01 DIAGNOSIS — G4733 Obstructive sleep apnea (adult) (pediatric): Secondary | ICD-10-CM | POA: Diagnosis present

## 2013-04-01 DIAGNOSIS — R079 Chest pain, unspecified: Secondary | ICD-10-CM | POA: Diagnosis not present

## 2013-04-01 DIAGNOSIS — E1165 Type 2 diabetes mellitus with hyperglycemia: Secondary | ICD-10-CM

## 2013-04-01 DIAGNOSIS — G459 Transient cerebral ischemic attack, unspecified: Secondary | ICD-10-CM

## 2013-04-01 DIAGNOSIS — I10 Essential (primary) hypertension: Secondary | ICD-10-CM | POA: Diagnosis present

## 2013-04-01 DIAGNOSIS — R569 Unspecified convulsions: Secondary | ICD-10-CM

## 2013-04-01 DIAGNOSIS — IMO0001 Reserved for inherently not codable concepts without codable children: Secondary | ICD-10-CM

## 2013-04-01 DIAGNOSIS — S0990XA Unspecified injury of head, initial encounter: Secondary | ICD-10-CM | POA: Diagnosis not present

## 2013-04-01 DIAGNOSIS — E781 Pure hyperglyceridemia: Secondary | ICD-10-CM | POA: Diagnosis present

## 2013-04-01 DIAGNOSIS — E1149 Type 2 diabetes mellitus with other diabetic neurological complication: Secondary | ICD-10-CM

## 2013-04-01 DIAGNOSIS — R202 Paresthesia of skin: Secondary | ICD-10-CM

## 2013-04-01 DIAGNOSIS — R200 Anesthesia of skin: Secondary | ICD-10-CM

## 2013-04-01 DIAGNOSIS — E1142 Type 2 diabetes mellitus with diabetic polyneuropathy: Secondary | ICD-10-CM | POA: Diagnosis present

## 2013-04-01 DIAGNOSIS — G40909 Epilepsy, unspecified, not intractable, without status epilepticus: Secondary | ICD-10-CM

## 2013-04-01 DIAGNOSIS — Z833 Family history of diabetes mellitus: Secondary | ICD-10-CM

## 2013-04-01 DIAGNOSIS — R51 Headache: Secondary | ICD-10-CM | POA: Diagnosis not present

## 2013-04-01 DIAGNOSIS — R0789 Other chest pain: Principal | ICD-10-CM | POA: Diagnosis present

## 2013-04-01 DIAGNOSIS — R55 Syncope and collapse: Secondary | ICD-10-CM

## 2013-04-01 DIAGNOSIS — F319 Bipolar disorder, unspecified: Secondary | ICD-10-CM | POA: Diagnosis present

## 2013-04-01 DIAGNOSIS — J449 Chronic obstructive pulmonary disease, unspecified: Secondary | ICD-10-CM | POA: Diagnosis present

## 2013-04-01 DIAGNOSIS — Z86718 Personal history of other venous thrombosis and embolism: Secondary | ICD-10-CM

## 2013-04-01 DIAGNOSIS — Z7982 Long term (current) use of aspirin: Secondary | ICD-10-CM

## 2013-04-01 DIAGNOSIS — Z6839 Body mass index (BMI) 39.0-39.9, adult: Secondary | ICD-10-CM

## 2013-04-01 DIAGNOSIS — F411 Generalized anxiety disorder: Secondary | ICD-10-CM

## 2013-04-01 DIAGNOSIS — Z825 Family history of asthma and other chronic lower respiratory diseases: Secondary | ICD-10-CM

## 2013-04-01 DIAGNOSIS — E669 Obesity, unspecified: Secondary | ICD-10-CM | POA: Diagnosis present

## 2013-04-01 DIAGNOSIS — Z8673 Personal history of transient ischemic attack (TIA), and cerebral infarction without residual deficits: Secondary | ICD-10-CM

## 2013-04-01 DIAGNOSIS — F418 Other specified anxiety disorders: Secondary | ICD-10-CM | POA: Diagnosis present

## 2013-04-01 DIAGNOSIS — Z8249 Family history of ischemic heart disease and other diseases of the circulatory system: Secondary | ICD-10-CM

## 2013-04-01 HISTORY — DX: Hyperlipidemia, unspecified: E78.5

## 2013-04-01 LAB — COMPREHENSIVE METABOLIC PANEL WITH GFR
ALT: 23 U/L (ref 0–35)
AST: 22 U/L (ref 0–37)
Albumin: 3.6 g/dL (ref 3.5–5.2)
Alkaline Phosphatase: 96 U/L (ref 39–117)
BUN: 10 mg/dL (ref 6–23)
CO2: 26 meq/L (ref 19–32)
Calcium: 9.3 mg/dL (ref 8.4–10.5)
Chloride: 100 meq/L (ref 96–112)
Creatinine, Ser: 0.82 mg/dL (ref 0.50–1.10)
GFR calc Af Amer: >90 mL/min
GFR calc non Af Amer: 86 mL/min — ABNORMAL LOW
Glucose, Bld: 193 mg/dL — ABNORMAL HIGH (ref 70–99)
Potassium: 3.9 meq/L (ref 3.5–5.1)
Sodium: 137 meq/L (ref 135–145)
Total Bilirubin: 0.2 mg/dL — ABNORMAL LOW (ref 0.3–1.2)
Total Protein: 7.8 g/dL (ref 6.0–8.3)

## 2013-04-01 LAB — CBC
HCT: 39.5 % (ref 36.0–46.0)
Hemoglobin: 13.7 g/dL (ref 12.0–15.0)
MCH: 29.9 pg (ref 26.0–34.0)
MCHC: 34.7 g/dL (ref 30.0–36.0)
MCV: 86.2 fL (ref 78.0–100.0)
Platelets: 281 10*3/uL (ref 150–400)
RBC: 4.58 MIL/uL (ref 3.87–5.11)
RDW: 14.3 % (ref 11.5–15.5)
WBC: 10.2 10*3/uL (ref 4.0–10.5)

## 2013-04-01 LAB — PRO B NATRIURETIC PEPTIDE: Pro B Natriuretic peptide (BNP): 24.7 pg/mL (ref 0–125)

## 2013-04-01 LAB — GLUCOSE, CAPILLARY: Glucose-Capillary: 236 mg/dL — ABNORMAL HIGH (ref 70–99)

## 2013-04-01 LAB — TROPONIN I
Troponin I: <0.3 ng/mL (ref <0.30)
Troponin I: <0.3 ng/mL

## 2013-04-01 MED ORDER — SODIUM CHLORIDE 0.9 % IJ SOLN
3.0000 mL | Freq: Two times a day (BID) | INTRAMUSCULAR | Status: DC
  Administered 2013-04-01: 3 mL via INTRAVENOUS

## 2013-04-01 MED ORDER — SODIUM CHLORIDE 0.9 % IJ SOLN
3.0000 mL | Freq: Two times a day (BID) | INTRAMUSCULAR | Status: DC
  Administered 2013-04-01 – 2013-04-02 (×3): 3 mL via INTRAVENOUS

## 2013-04-01 MED ORDER — SODIUM CHLORIDE 0.9 % IJ SOLN: 3.0000 mL | INTRAMUSCULAR | Status: DC | PRN

## 2013-04-01 MED ORDER — FLUOXETINE HCL 40 MG PO CAPS: 40.0000 mg | ORAL_CAPSULE | Freq: Every day | ORAL | Status: DC

## 2013-04-01 MED ORDER — ALBUTEROL SULFATE HFA 108 (90 BASE) MCG/ACT IN AERS: 2.0000 | INHALATION_SPRAY | RESPIRATORY_TRACT | Status: DC | PRN

## 2013-04-01 MED ORDER — SODIUM CHLORIDE 0.9 % IV SOLN: 250.0000 mL | INTRAVENOUS | Status: DC | PRN

## 2013-04-01 MED ORDER — INSULIN REGULAR HUMAN (CONC) 500 UNIT/ML ~~LOC~~ SOLN: SUBCUTANEOUS | Status: DC

## 2013-04-01 MED ORDER — LISINOPRIL 10 MG PO TABS
10.0000 mg | ORAL_TABLET | Freq: Every day | ORAL | Status: DC
  Administered 2013-04-01: 10 mg via ORAL
  Filled 2013-04-01 (×2): qty 1

## 2013-04-01 MED ORDER — GABAPENTIN 300 MG PO CAPS
300.0000 mg | ORAL_CAPSULE | Freq: Three times a day (TID) | ORAL | Status: DC
  Administered 2013-04-01 – 2013-04-02 (×4): 300 mg via ORAL
  Filled 2013-04-01 (×5): qty 1

## 2013-04-01 MED ORDER — ATORVASTATIN CALCIUM 80 MG PO TABS
80.0000 mg | ORAL_TABLET | Freq: Every day | ORAL | Status: DC
  Administered 2013-04-01 – 2013-04-02 (×2): 80 mg via ORAL
  Filled 2013-04-01 (×2): qty 1

## 2013-04-01 MED ORDER — CLONAZEPAM 1 MG PO TABS
1.0000 mg | ORAL_TABLET | Freq: Three times a day (TID) | ORAL | Status: DC
Start: 2013-04-01 — End: 2013-04-02
  Administered 2013-04-01 – 2013-04-02 (×4): 1 mg via ORAL
  Filled 2013-04-01 (×4): qty 1

## 2013-04-01 MED ORDER — ACETAMINOPHEN 325 MG PO TABS: 650.0000 mg | ORAL_TABLET | Freq: Four times a day (QID) | ORAL | Status: DC | PRN

## 2013-04-01 MED ORDER — ONDANSETRON HCL 4 MG PO TABS: 4.0000 mg | ORAL_TABLET | Freq: Four times a day (QID) | ORAL | Status: DC | PRN

## 2013-04-01 MED ORDER — NICOTINE 7 MG/24HR TD PT24
7.0000 mg | MEDICATED_PATCH | Freq: Every day | TRANSDERMAL | Status: DC
  Administered 2013-04-01 – 2013-04-02 (×2): 7 mg via TRANSDERMAL
  Filled 2013-04-01 (×2): qty 1

## 2013-04-01 MED ORDER — MOMETASONE FURO-FORMOTEROL FUM 200-5 MCG/ACT IN AERO
2.0000 | INHALATION_SPRAY | Freq: Two times a day (BID) | RESPIRATORY_TRACT | Status: DC
  Administered 2013-04-01 – 2013-04-02 (×2): 2 via RESPIRATORY_TRACT
  Filled 2013-04-01 (×2): qty 8.8

## 2013-04-01 MED ORDER — INSULIN REGULAR HUMAN (CONC) 500 UNIT/ML ~~LOC~~ SOLN
100.0000 [IU] | Freq: Every day | SUBCUTANEOUS | Status: DC
  Administered 2013-04-02: 100 [IU] via SUBCUTANEOUS
  Filled 2013-04-01: qty 20

## 2013-04-01 MED ORDER — COLESEVELAM HCL 625 MG PO TABS
1250.0000 mg | ORAL_TABLET | Freq: Two times a day (BID) | ORAL | Status: DC
  Administered 2013-04-01 – 2013-04-02 (×3): 1250 mg via ORAL
  Filled 2013-04-01 (×5): qty 2

## 2013-04-01 MED ORDER — ONDANSETRON HCL 4 MG/2ML IJ SOLN: 4.0000 mg | Freq: Four times a day (QID) | INTRAMUSCULAR | Status: DC | PRN

## 2013-04-01 MED ORDER — ACETAMINOPHEN 650 MG RE SUPP: 650.0000 mg | Freq: Four times a day (QID) | RECTAL | Status: DC | PRN

## 2013-04-01 MED ORDER — QUETIAPINE FUMARATE ER 400 MG PO TB24
800.0000 mg | ORAL_TABLET | Freq: Every day | ORAL | Status: DC
  Administered 2013-04-01: 800 mg via ORAL
  Filled 2013-04-01 (×2): qty 2

## 2013-04-01 MED ORDER — SODIUM CHLORIDE 0.9 % IJ SOLN: 3.0000 mL | Freq: Two times a day (BID) | INTRAMUSCULAR | Status: DC

## 2013-04-01 MED ORDER — CANAGLIFLOZIN 300 MG PO TABS
300.0000 mg | ORAL_TABLET | Freq: Every morning | ORAL | Status: DC
  Administered 2013-04-02: 300 mg via ORAL

## 2013-04-01 MED ORDER — INSULIN REGULAR HUMAN (CONC) 500 UNIT/ML ~~LOC~~ SOLN
200.0000 [IU] | Freq: Every day | SUBCUTANEOUS | Status: DC
  Administered 2013-04-02: 200 [IU] via SUBCUTANEOUS
  Filled 2013-04-01: qty 20

## 2013-04-01 MED ORDER — FLUOXETINE HCL 20 MG PO CAPS
40.0000 mg | ORAL_CAPSULE | Freq: Every day | ORAL | Status: DC
  Administered 2013-04-02: 40 mg via ORAL
  Filled 2013-04-01: qty 2

## 2013-04-01 MED ORDER — LAMOTRIGINE 200 MG PO TABS
200.0000 mg | ORAL_TABLET | Freq: Every day | ORAL | Status: DC
  Administered 2013-04-01: 200 mg via ORAL
  Filled 2013-04-01 (×2): qty 1

## 2013-04-01 NOTE — Telephone Encounter (Signed)
Pt wants to know if she takes all her meds with her to the hospital Please advise

## 2013-04-01 NOTE — Consult Note (Signed)
HPI: 42 year old female for evaluation of chest pain. Previously seen by Dr. Antoine Poche in 2009 with chest pain. Patient had a normal dobutamine Myoview at that time by report. Echocardiogram in June 2009 showed normal LV function. Patient also previously seen by Dr. Graciela Husbands for syncope. Apparently a Holter monitor showed no significant arrhythmias associated with these events. Tilt table showed orthostatic tachycardia without syncope. Patient now admitted from internal medicine clinic with chest pain. She has had intermittent chest pain since age 32. Her episodes are substernal without radiation. There is associated diaphoresis and nausea but no shortness of breath. The pain increases with certain movements and inspiration. It is not related to food or exertion. It lasts approximately 5 minutes and resolve spontaneously. It is described as a sharp pain. She also complains of numb sensation in her lower extremities from the mid tibia down bilaterally. She also describes some weakness in her second, third, fourth and fifth digits bilaterally in her lower extremities. She pulled a tooth recently and had an associated syncopal episode. Cardiology is asked to evaluate.  Medications Prior to Admission  Medication Sig Dispense Refill  . albuterol (PROVENTIL) (2.5 MG/3ML) 0.083% nebulizer solution Take 2.5 mg by nebulization every evening.       Marland Kitchen aspirin EC 81 MG tablet Take 81 mg by mouth every morning.      . Canagliflozin (INVOKANA) 300 MG TABS Take 300 mg by mouth every morning.      . clonazePAM (KLONOPIN) 1 MG tablet Take 1 mg by mouth 3 (three) times daily.      . colesevelam (WELCHOL) 625 MG tablet Take 1,250 mg by mouth 2 (two) times daily with a meal.      . FLUoxetine (PROZAC) 40 MG capsule Take 40 mg by mouth daily.       . Fluticasone-Salmeterol (ADVAIR DISKUS) 500-50 MCG/DOSE AEPB Inhale 1 puff into the lungs 2 (two) times daily.       Marland Kitchen gabapentin (NEURONTIN) 300 MG capsule Take 1 capsule (300 mg  total) by mouth 3 (three) times daily.  90 capsule  5  . ibuprofen (ADVIL,MOTRIN) 600 MG tablet Take 600 mg by mouth 3 (three) times daily.       . insulin regular human CONCENTRATED (HUMULIN R) 500 UNIT/ML SOLN injection Inject 24-40 Units into the skin 2 (two) times daily with a meal. Pt will inject 24 units every morning and 40 units in the evening ONLY IF blood sugar is above 115.      . lamoTRIgine (LAMICTAL) 200 MG tablet Take 200 mg by mouth at bedtime.      Marland Kitchen lisinopril (PRINIVIL,ZESTRIL) 10 MG tablet Take 10 mg by mouth at bedtime.       Marland Kitchen QUEtiapine (SEROQUEL XR) 400 MG 24 hr tablet Take 800 mg by mouth at bedtime.      . rosuvastatin (CRESTOR) 40 MG tablet Take 40 mg by mouth at bedtime.      . traMADol (ULTRAM) 50 MG tablet Take 100 mg by mouth 2 (two) times daily.      Marland Kitchen albuterol (PROVENTIL HFA;VENTOLIN HFA) 108 (90 BASE) MCG/ACT inhaler Inhale 2 puffs into the lungs every 4 (four) hours as needed. For shortness of breath        Allergies  Allergen Reactions  . Montelukast Sodium     REACTION: swollen throat  . Morphine Sulfate     REACTION: Swollen Throat - Able to tolerate dilaudid  . Penicillins     REACTION: hives, breathing problems,  swelling on throat    Past Medical History  Diagnosis Date  . Asthma   . COPD (chronic obstructive pulmonary disease)   . Diabetes mellitus   . Renal disorder   . Hypertension   . Seizures   . Syncope   . Bipolar 1 disorder   . DVT (deep venous thrombosis)     2011- was on coumadin for a year  . Stroke     2010  . GERD (gastroesophageal reflux disease)   . Hyperlipidemia     Past Surgical History  Procedure Laterality Date  . Laparoscopy      Endometriosis    History   Social History  . Marital Status: Married    Spouse Name: N/A    Number of Children: N/A  . Years of Education: N/A   Occupational History  . Not on file.   Social History Main Topics  . Smoking status: Current Every Day Smoker -- 1.00 packs/day  for 20 years    Types: Cigarettes  . Smokeless tobacco: Not on file  . Alcohol Use: No  . Drug Use: No  . Sexual Activity: Yes   Other Topics Concern  . Not on file   Social History Narrative   Lives in [place]   Married but lives with Boyfriend - not legally separated   Disabled - for BiPolar, Seizure disorder, diabetes   Formerly worked at [employer]          Family History  Problem Relation Age of Onset  . Venous thrombosis Brother   . Asthma Father   . Diabetes Father   . Coronary artery disease Mother   . Hypertension Mother   . Diabetes Mother   . Asthma Sister     ROS:  no fevers or chills, productive cough, hemoptysis, dysphasia, odynophagia, melena, hematochezia, dysuria, hematuria, rash, seizure activity, orthopnea, PND, pedal edema, claudication. Remaining systems are negative.  Physical Exam:   Blood pressure 124/81, pulse 100, temperature 98.1 F (36.7 C), temperature source Oral, resp. rate 18, height 5\' 4"  (1.626 m), weight 232 lb 5.8 oz (105.4 kg), SpO2 97.00%.  General:  Well developed/well nourished in NAD Skin warm/dry Patient not depressed No peripheral clubbing Back-normal HEENT-normal/normal eyelids Neck supple/normal carotid upstroke bilaterally; no bruits; no JVD; no thyromegaly chest - CTA/ normal expansion CV - RRR/normal S1 and S2; no murmurs, rubs or gallops;  PMI nondisplaced Abdomen -NT/ND, no HSM, no mass, + bowel sounds, no bruit 2+ femoral pulses, no bruits Ext-no edema, chords, 2+ DP Neuro-grossly nonfocal  ECG normal sinus rhythm with no ST changes.  Laboratories are pending.  Assessment/Plan 1 chest pain-symptoms are chronic and extremely atypical. Will echocardiogram shows no ST changes. Enzymes are pending. If they are negative then it would be worthwhile to perform a functional study as an outpatient for risk stratification given her multiple risk factors. 2 lower extremity weakness-management and evaluation per primary  care. 3 tobacco abuse-patient counseled on discontinuing. 4 syncope-previous evaluation negative. Most recent event occurred immediately after pulling tooth and most likely vagally mediated. Follow on telemetry. 5 diabetes mellitus-management per primary care.  Olga Millers MD 04/01/2013, 5:05 PM

## 2013-04-01 NOTE — Progress Notes (Signed)
Interim Progress Note S: Patient direct admitted from clinic for chest pain rule out and stroke/tia work up.  She reports that she has been having intermittent chest pain associated with dizziness and diaphoresis.  Had an episode of crushing chest pain on Saturday that was not evaluated.  Also reports that she can no longer feel her lower legs and has trouble moving her lateral 4 toes on both sides; this has been present for about 2 weeks.  Also reports a history of seizures, described as "falling out," lasting 2 minutes and followed by slurred speech and fatigue for several hours.  These occur 1-2x per month.  O:  BP 124/81  Pulse 100  Temp(Src) 98.1 F (36.7 C) (Oral)  Resp 18  Ht 5\' 4"  (1.626 m)  Wt 232 lb 5.8 oz (105.4 kg)  BMI 39.87 kg/m2  SpO2 97% Gen: alert, cooperative, sitting in bed eating soup HEENT: EOMI, MMM, no pharyngeal erythema or exudate Neck: supple CV: RRR, no murmurs Pulm: good air movement; did have some left basilar crackles that cleared, no wheezing Abd: +BS, soft, NTND Ext: no edema, 2+ DP pulses Neuro: EOMI, 5/5 strength in upper extremities, 5-/5 strength in plantar flexion bilaterally, no sensation in either leg below the knee, unable to elicit babinski on either side  A/P: 42 yo woman with chest pain and bilateral lower leg sensory changes.  # Neuro - abnormal sensation in bilateral lower legs, h/o seizures, diabetic neuropathy Presentation is not consistent with a stroke or TIA but she does have multiple risk factors.  More likely a peripheral neuropathy given the distribution, could also consider spinal cord compression but this does not follow a spinal level. - stroke work up as in H&P - continue home gabapentin, lamictal, klonopin - will add B12 and folate to evaluate for peripheral neuropathy - EEG as per H&P  # Cardiovascular - ?syncope, chest pain - ACS rule out as in H&P  # Respiratory - COPD, tobacco abuse - continue home medications -  nicotine patch as inpatient  # Endocrine - DM - continue home invokana - U-500 at decreased dose (200 qAM, 100 qHS)  FEN/GI: SLIV; tolerating food from home without coughing or choking, will defer swallow eval and start cardiac diet PPx: SQ heparin pending MRI without brain bleed  Dispo: admit to inpatient telemetry for cardiac and neuro work up  [employer], Kayman Snuffer 04/01/2013, 3:44 PM

## 2013-04-01 NOTE — H&P (Signed)
Family Medicine Teaching [facility] Admission History and Physical Service Pager: [phone]  Patient name: [name] Medical record number: [id number] Date of birth: 18-Oct-1970 Age: 42 y.o. Gender: female  Primary Care Provider: Gaspar Bidding, DO Consultants: None yet Code Status: Needs to be addressed  Chief Complaint: Chest Pain, Weakness, B LE numbness  Assessment and Plan: [name] is a 42 y.o. year old female presenting with 2-3 week history of generalized weakness, decreased B LE motor function and sensory changes and 3 day history of sharp substernal chest pain lasting 3-5 minutes . PMH is significant for Seizure disorder, prior TIA, poorly controlled DM, HTN and tobacco abuse.  # NEURO SYMPTOMS (non-focal weakness, areflexia, decreased LE sensation, LE discortination, confusion and slight slurring of speech, hx of seizure disorder).  Plan to admit to IP for stroke eval. - I am concerned for a primary Neurologic event specifically cerebellar CVA.  If workup negative consider MRI of spine to eval for demylenating disease [ ]  MRI/A Brain  [ ]  2D ECHO [ ]  Carotid doppler [ ]  EEG > consider medication effect but no significant changes in meds.  No reported significant hypoglyemia  # Chest Pain:  Pt has multiple risk factors for CAD can concerning symptoms.  Currently symptom free.   - Continue ASA.   [ ]  cycle CEs [ ]  AM ECG - Telemetry - STOP IBUPROFEN > Consider Cardiology evaluation given symptoms.  Pt likely needs nuclear study vs CATH given risk factors and symptoms > Consider heparin/nitro if unstable of enzymes rule in infarct  # DM: poorly controlled Will need close monitoring and adjustment of insulin during hospitalization > order insulin  # HLD: poorly controlled on last check - continue statin  - continue welchol for prevention of pancreatitis  # HTN: continue home meds  # COPD: continue home meds  # Depression: continue home meds   FEN/GI:  cardiac diet once passes swallow screen Prophylaxis: after MRI Heparin SQ  Disposition: admit to IP on telemetry  History of Present Illness: [name] is a 42 y.o. year old female presenting with history of sharp chest pain that feels like a knife stabbing her.  She breaks out in a sweat following this. Pressure has been recurrent over past 2-3 weeks but worst 2 days ago.  Last 4-5 minutes.  + DOE, + palpitations.    She also reports B LE weakness that began approxiately 2-3 weeks ago.  She has difficulty feeling her feet. She has worsening confusion, slight slurring of her speech.  No unilateral symptoms but pt reports her 4 lateral toes stopped working on B feet.    Review Of Systems: Per HPI with the following additions: She denies any history of recent or remote injury that might have caused or be related to these current symptoms. Denies fevers, chills.  No blurred vision.  No dysuria.   Sugars have been >70 consistently but this is improved.   Otherwise 12 point review of systems was performed and was unremarkable.  Patient Active Problem List   Diagnosis Date Noted  . Numbness and tingling 04/01/2013  . Foot swelling 12/11/2012  . Elevated BP 05/29/2012  . Sleep apnea 05/22/2012  . Syncope 10/09/2011  . Sinus tachycardia 10/09/2011  . Seizure disorder 10/09/2011  . Bipolar disorder 10/09/2011  . Tobacco dependence 10/09/2011  . Seizures   . TIA (transient ischemic attack) 06/22/2011  . Constipation, chronic 06/15/2011  . UNSPECIFIED DISORDER TEETH&SUPPORTING STRUCTURES 08/16/2010  . DIABETIC PERIPHERAL NEUROPATHY 06/08/2010  .  HYPERTRIGLYCERIDEMIA 07/17/2007  . ANXIETY STATE, UNSPECIFIED 05/30/2007  . COPD 05/30/2007  . OBESITY NOS 04/23/2007  . DM (diabetes mellitus), type 2, uncontrolled 03/22/2007  . TOBACCO DEPENDENCE 09/07/2006  . ASTHMA, PERSISTENT 09/07/2006   Past Medical History: The patients history is remarkable for: # CV Disease: hx of TIA, Sinus  Tachycardia, HTN, HLD, DM, tobacco abuse, OSA, poor dentention  Poorly controlled chronic diseases  Not followed recently by cardiology # Uncontrolled DM: with peripheral neuropathy  Managed by Endocrinology  Most recent A1c (outside lab) 01/23/13 = 7.7 # Neuropsych: Seizure Disorder, Bipolar Disorder, Anxiety  Followed by psychiatry # RESP: COPD, Asthma, Sleep Apnea,    Health Care Maintenance:  Uptodate on PAP until 2017  Due for Influenza Brief Social History:  Current Everyday smoker  Other Specialists: Psychiatry - Dr. Tomasa Rand Endocrinology - Dr. Sharl Ma      Recent Labs  02/04/13 1003  TRIG 658*  CHOL 204*  HDL 34*  LDLCALC NOT CALC   Wt Readings from Last 3 Encounters:  02/04/13 225 lb (102.059 kg)  12/10/12 223 lb (101.152 kg)  05/07/12 223 lb (101.152 kg)          Past Medical History  Diagnosis Date  . Asthma   . COPD (chronic obstructive pulmonary disease)   . Diabetes mellitus   . Renal disorder   . Hypertension   . Seizures   . Syncope   . Bipolar 1 disorder   . DVT (deep venous thrombosis)     2011- was on coumadin for a year  . Stroke     2010  . GERD (gastroesophageal reflux disease)    Past Surgical History: Past Surgical History  Procedure Laterality Date  . Laparoscopy      Endometriosis   Social History: History  Substance Use Topics  . Smoking status: Current Every Day Smoker -- 1.00 packs/day for 20 years    Types: Cigarettes  . Smokeless tobacco: Not on file  . Alcohol Use: No   Family History: Family History  Problem Relation Age of Onset  . Venous thrombosis Brother   . Asthma Father   . Diabetes Father   . Coronary artery disease Mother   . Hypertension Mother   . Diabetes Mother   . Asthma Sister    Allergies and Medications: Allergies  Allergen Reactions  . Montelukast Sodium     REACTION: swollen throat  . Morphine Sulfate     REACTION: Swollen Throat - Able to tolerate dilaudid  . Penicillins      REACTION: hives, breathing problems, swelling on throat   Current Facility-Administered Medications on File Prior to Encounter  Medication Dose Route Frequency Provider Last Rate Last Dose  . 0.9 %  sodium chloride infusion  250 mL Intravenous PRN Andrena Mews, DO      . sodium chloride 0.9 % injection 3 mL  3 mL Intravenous Q12H Andrena Mews, DO      . sodium chloride 0.9 % injection 3 mL  3 mL Intravenous PRN Andrena Mews, DO       Current Outpatient Prescriptions on File Prior to Encounter  Medication Sig Dispense Refill  . albuterol (PROVENTIL HFA;VENTOLIN HFA) 108 (90 BASE) MCG/ACT inhaler Inhale 2 puffs into the lungs every 4 (four) hours as needed. For shortness of breath      . albuterol (PROVENTIL) (2.5 MG/3ML) 0.083% nebulizer solution Take 2.5 mg by nebulization daily.        Marland Kitchen aspirin (ANACIN) 81  MG EC tablet Take 81 mg by mouth daily.        . Canagliflozin (INVOKANA) 300 MG TABS Take 300 mg by mouth daily.  30 tablet    . clonazePAM (KLONOPIN) 0.5 MG tablet Take 0.5 mg by mouth 3 (three) times daily.       . colesevelam (WELCHOL) 625 MG tablet Take 3 tablets (1,875 mg total) by mouth 2 (two) times daily with a meal.  180 tablet  5  . FLUoxetine (PROZAC) 40 MG capsule Take 40 mg by mouth daily.       . Fluticasone-Salmeterol (ADVAIR DISKUS) 500-50 MCG/DOSE AEPB Inhale 1 puff into the lungs 2 (two) times daily.       Marland Kitchen gabapentin (NEURONTIN) 300 MG capsule Take 1 capsule (300 mg total) by mouth 3 (three) times daily.  90 capsule  5  . ibuprofen (ADVIL,MOTRIN) 600 MG tablet Take 600 mg by mouth every 6 (six) hours as needed.      . insulin regular human CONCENTRATED (HUMULIN R) 500 UNIT/ML SOLN injection 120Units before breakfast & 150units before evening meal subcutaneously    0  . lamoTRIgine (LAMICTAL) 200 MG tablet Take 200 mg by mouth at bedtime.      Marland Kitchen lisinopril (PRINIVIL,ZESTRIL) 10 MG tablet Take 10 mg by mouth daily.      . QUEtiapine (SEROQUEL XR) 400 MG 24  hr tablet Take 800 mg by mouth at bedtime.      . rosuvastatin (CRESTOR) 40 MG tablet Take 1 tablet (40 mg total) by mouth daily.  30 tablet  5  . traMADol (ULTRAM) 50 MG tablet Take 2 tablets (100 mg total) by mouth every 6 (six) hours as needed for pain.  90 tablet  0    Objective: There were no vitals taken for this visit. PHYSICAL EXAM: GENERAL: Adult caucasian  female. In no discomfort; no respiratory distress  PSYCH: alert and appropriate, good insight   HNEENT: EOMI, no scleral icterus  CARDIO: 2+/6 SEM  LUNGS: CTAB  ABDOMEN:    EXTREM:    Warm, well perfused.  Moves all 4 extremities spontaneously; no lateralization.  No noted foot lesions.  Distal pulses 1+/4.  1+/4 B LE edema  GU:    SKIN:     NEUROMSK:  NEUROLOGIC EXAM: Gross Deficits: no  Speech: Slight slurring  Gait: Wide based, no romberg  Cerebellar: Intact FNF, intact RAM  CN: II-XI intact  Myotome Testing: Diffuse weakness in B LE especially in ankle dorsiflexion/plantarflexion, knee extension  Sensation Testing: Insensate to monofilament + in distal LE Insensate to sharp in distal LE  Reflexes: Diffusely areflexic Upward babinski on L, could not illicit on R           Labs and Imaging: CBC BMET  No results found for this basename: WBC, HGB, HCT, PLT,  in the last 168 hours No results found for this basename: NA, K, CL, CO2, BUN, CREATININE, GLUCOSE, CALCIUM,  in the last 168 hours   EKG - SNR - no ischemia, long QT  Andrena Mews, DO 04/01/2013, 12:22 PM PGY-3, [facility] FPTS Intern pager: [phone], text pages welcome

## 2013-04-01 NOTE — Progress Notes (Signed)
Patient ID: [name], female   DOB: July 12, 1970, 42 y.o.   MRN: [id number] BRIEF ADMIT NOTE: Please see complete history and physical by Dr. Berline Chough for details. This is a brief admit note.  I saw [name] with Dr. Berline Chough in family medicine clinic this morning. She has a confusing constellation of symptoms.   #1. On Saturday she had an episode of sharp penetrating midsternal chest pain that lasted 5-10 minutes. She has never had this type of pain before. She was not doing anything exertional, A. resolved on its own. It was associated with some sweating and mild nausea. She has not recently had any other type of chest pains. She is chest pain-free at this time.   #2. Since Saturday she is also had some slurred speech that waxes and wanes. She noticed it Saturday morning before the episode of chest pain. She thinks first speech is still a little bit slurred. Her family has noticed it as well. She's had no visual changes. No one has noticed any mouth drooping, she's had no swallowing problems. #3. Over the last few days she's had increase in her numbness bilateral lower extremities to the point where she is afraid to drive because her feet can no longer feel the pedals. She also has problems with her foot falling off the pedal. She's having difficulty feeling the floor when she walks it feels like she's unbalanced. She's had a couple of near falls.  Pertinent past medical history is diabetes mellitus on high-dose insulin therapy. She also has history of seizure disorder, bipolar disorder, peripheral neuropathy thought to be related to her diabetes.  On exam today she is in no acute distress. Neuro: Unable to elicit reflexes at the knees forearm elbow. I can get reflexes 0-1+ at bilateral ankle. Decreased sensation to soft touch and sharp touch up to mid shin bilaterally. The hands appear to have normal sensation to soft touch and sharp touch. MWU:[id number] strength with plantar flexion and dorsiflexion  although I'm unsure of effort.  Strength of great toe and plantarflexion dorsiflexion 4/5 bilaterally. Knee flexion and extension abduction and adduction also 4/5 bilaterally. Upper extremity strength 5 out of 5 and symmetrical. Gross motor control is good and she can get up on the exam table and off exam table without assistance. G. AIT: Wide based. No drift. PSYCH: Alert and oriented x4. Speech seems fluent in nature and normal in content. I cannot detect any slurred speech. She is partially edentulous and that complicates matters a little bit. Denies hallucination. Seems to have intact remote and recent memory. Asks and answers questions appropriately. Affect is interactive.  EKG NSR QTC 460 No acute ST-T changes. IVCD  ASSESSMENT: #1. Episode of chest pain. Given her other constellation of symptoms I think we need to series of cardiac enzymes. Risk stratification. #2. Balance issues, lower extremity numbness that seems increase compared to her baseline peripheral neuropathy, complaining of speech slurring and one episode of confusion on Saturday. Consistent with TIA versus CVA. If this is TIA, certainly it could be crescendo TIA. Will admit for TIA/CVA workup. I suspect that the peripheral neuropathy increase in symptoms is related more to her diabetes but given her multiple risk factors I think we need to pursue full workup. Patient will be admitted to family medicine teaching service.

## 2013-04-01 NOTE — Progress Notes (Signed)
[name]  [name] - 42 y.o. female MRN [id number]  Date of birth: Sep 08, 1970  HPI & ROS  [name] presents today for evaluation of chest pain and LE numbness with falls:  Chest pain - is sharp knife then breaks out in a sweat - last for 4-5 minutes.  2-3 weeks. Some palpitations flutters/skips a beat.  Unstable angina symptoms; can occur while watching TV or with exertion.  Took an ASA but no NITRO.  Falls over the past 2-3 weeks Decreased sensation on distal foot  B LE, no UE. No   B 4 lateral toes insensate  DM sugar going low.  Liquid diet due to teeth; second week in OCT scheduled to have teeth extracted.  + difficulty with driving - feet slipping off pedals; confusion  Care Coordination & Pertinent History  The patients history is remarkable for: # CV Disease: hx of TIA, Sinus Tachycardia, HTN, HLD, DM, tobacco abuse, OSA, poor dentention  Poorly controlled chronic diseases  Not followed recently by cardiology # Uncontrolled DM: with peripheral neuropathy  Managed by Endocrinology  Most recent A1c (outside lab) 01/23/13 = 7.7 # Neuropsych: Seizure Disorder, Bipolar Disorder, Anxiety  Followed by psychiatry # RESP: COPD, Asthma, Sleep Apnea,    Health Care Maintenance:  Uptodate on PAP until 2017  Due for Influenza Brief Social History:  Current Everyday smoker  Other Specialists: Psychiatry - Dr. Tomasa Rand Endocrinology - Dr. Sharl Ma      Recent Labs  02/04/13 1003  TRIG 658*  CHOL 204*  HDL 34*  LDLCALC NOT CALC   Wt Readings from Last 3 Encounters:  02/04/13 225 lb (102.059 kg)  12/10/12 223 lb (101.152 kg)  05/07/12 223 lb (101.152 kg)          Otherwise please see associated EMR sections for complete problem List, past medical history, past surgical history, family history and social history. Medications   Prior to Admission medications   Medication Sig Start Date End Date Taking? Authorizing Provider   albuterol (PROVENTIL HFA;VENTOLIN HFA) 108 (90 BASE) MCG/ACT inhaler Inhale 2 puffs into the lungs every 4 (four) hours as needed. For shortness of breath    Edd Arbour, MD  albuterol (PROVENTIL) (2.5 MG/3ML) 0.083% nebulizer solution Take 2.5 mg by nebulization daily.      Historical Provider, MD  aspirin (ANACIN) 81 MG EC tablet Take 81 mg by mouth daily.      Historical Provider, MD  Canagliflozin (INVOKANA) 300 MG TABS Take 300 mg by mouth daily. 02/11/13   Andrena Mews, DO  clonazePAM (KLONOPIN) 0.5 MG tablet Take 0.5 mg by mouth 3 (three) times daily.     Historical Provider, MD  colesevelam [facility]) 625 MG tablet Take 3 tablets (1,875 mg total) by mouth 2 (two) times daily with a meal. 03/14/13   Andrena Mews, DO  fluconazole (DIFLUCAN) 150 MG tablet Take 1 tablet (150 mg total) by mouth once. 02/04/13   Andrena Mews, DO  FLUoxetine (PROZAC) 40 MG capsule Take 40 mg by mouth daily.     Historical Provider, MD  Fluticasone-Salmeterol (ADVAIR DISKUS) 500-50 MCG/DOSE AEPB Inhale 1 puff into the lungs 2 (two) times daily.     Historical Provider, MD  gabapentin (NEURONTIN) 300 MG capsule Take 1 capsule (300 mg total) by mouth 3 (three) times daily. 10/15/12   Garnetta Buddy, MD  ibuprofen (ADVIL,MOTRIN) 600 MG tablet Take 600 mg by mouth every 6 (six) hours as needed.  Historical Provider, MD  insulin regular human CONCENTRATED (HUMULIN R) 500 UNIT/ML SOLN injection 250Units before breakfast & 150units before evening meal subcutaneously 02/11/13   Andrena Mews, DO  lamoTRIgine (LAMICTAL) 200 MG tablet Take 200 mg by mouth at bedtime.    Historical Provider, MD  lisinopril (PRINIVIL,ZESTRIL) 10 MG tablet Take 10 mg by mouth daily.    Historical Provider, MD  QUEtiapine (SEROQUEL XR) 400 MG 24 hr tablet Take 800 mg by mouth at bedtime.    Historical Provider, MD  rosuvastatin (CRESTOR) 40 MG tablet Take 1 tablet (40 mg total) by mouth daily. 03/14/13   Andrena Mews, DO  traMADol  (ULTRAM) 50 MG tablet Take 2 tablets (100 mg total) by mouth every 6 (six) hours as needed for pain. 02/27/13   Andrena Mews, DO    Objective Findings:  Vitals: BP 125/64  Pulse 100  Temp(Src) 97.4 F (36.3 C) (Oral)  Ht 5\' 4"  (1.626 m)  Wt 227 lb (102.967 kg)  BMI 38.95 kg/m2 PE: GENERAL:    Adult Caucasian  female.  In no discomfort; no respiratory distress.   PSYCH:    Alert and appropriate.  Flattened affect - slowed mentation compared to prior HNEENT:     CARDIO:    2+/6 SEM, RRR, s1/s2  EKG - Sinus 96, Prolonged QT, no ischemia LUNGS:    CTA B, no wheezes, no crackles ABDOMEN:     EXTREMITIES:    Warm well perfused, 1+/4 LE edema,  GU:      SKIN:      NEUROMSK:    NEUROLOGIC EXAM: Gross Deficits:   Speech: slurring  Gait:   Cerebellar:   CN:   Myotome Testing:   Sensation Testing:   Reflexes:     Assessment     ICD-9-CM   1. Chest pain 786.50 EKG 12-Lead  2. Seizures 780.39   3. Type II or unspecified type diabetes mellitus with neurological manifestations, not stated as uncontrolled(250.60) 250.60   4. TIA (transient ischemic attack) 435.9 sodium chloride 0.9 % injection 3 mL    sodium chloride 0.9 % injection 3 mL    0.9 %  sodium chloride infusion  5. Numbness and tingling 782.0     Plan    Admit to In-patient. Stroke Eval with MRI, carotid dopplers, ECHO Consider EEG given seizure hx Monitor CBGs Consider Cardiac etiology vs Neurologic given hx vs metabolic Hold anticoagulation until MRI performed; on ASA    SEE H&P for Further information and Exam findings

## 2013-04-01 NOTE — Patient Instructions (Signed)
Sent to hospital.

## 2013-04-02 ENCOUNTER — Encounter (HOSPITAL_COMMUNITY): Payer: Self-pay | Admitting: Family Medicine

## 2013-04-02 ENCOUNTER — Inpatient Hospital Stay (HOSPITAL_COMMUNITY): Payer: Managed Care, Other (non HMO)

## 2013-04-02 ENCOUNTER — Ambulatory Visit (HOSPITAL_COMMUNITY): Payer: Managed Care, Other (non HMO)

## 2013-04-02 DIAGNOSIS — F411 Generalized anxiety disorder: Secondary | ICD-10-CM | POA: Diagnosis not present

## 2013-04-02 DIAGNOSIS — R0789 Other chest pain: Principal | ICD-10-CM

## 2013-04-02 DIAGNOSIS — R079 Chest pain, unspecified: Secondary | ICD-10-CM | POA: Diagnosis not present

## 2013-04-02 DIAGNOSIS — IMO0001 Reserved for inherently not codable concepts without codable children: Secondary | ICD-10-CM | POA: Diagnosis not present

## 2013-04-02 DIAGNOSIS — R209 Unspecified disturbances of skin sensation: Secondary | ICD-10-CM | POA: Diagnosis not present

## 2013-04-02 DIAGNOSIS — R918 Other nonspecific abnormal finding of lung field: Secondary | ICD-10-CM | POA: Diagnosis not present

## 2013-04-02 DIAGNOSIS — G459 Transient cerebral ischemic attack, unspecified: Secondary | ICD-10-CM | POA: Diagnosis not present

## 2013-04-02 DIAGNOSIS — I517 Cardiomegaly: Secondary | ICD-10-CM | POA: Diagnosis not present

## 2013-04-02 DIAGNOSIS — R55 Syncope and collapse: Secondary | ICD-10-CM

## 2013-04-02 DIAGNOSIS — R030 Elevated blood-pressure reading, without diagnosis of hypertension: Secondary | ICD-10-CM | POA: Diagnosis not present

## 2013-04-02 LAB — BASIC METABOLIC PANEL
BUN: 10 mg/dL (ref 6–23)
CO2: 28 mEq/L (ref 19–32)
Calcium: 9.1 mg/dL (ref 8.4–10.5)
Chloride: 103 mEq/L (ref 96–112)
Creatinine, Ser: 0.79 mg/dL (ref 0.50–1.10)
GFR calc Af Amer: >90 mL/min (ref >90)
GFR calc non Af Amer: >90 mL/min (ref >90)
Glucose, Bld: 168 mg/dL — ABNORMAL HIGH (ref 70–99)
Potassium: 4 mEq/L (ref 3.5–5.1)
Sodium: 140 mEq/L (ref 135–145)

## 2013-04-02 LAB — CBC
HCT: 38.8 % (ref 36.0–46.0)
Hemoglobin: 13.4 g/dL (ref 12.0–15.0)
MCH: 30 pg (ref 26.0–34.0)
MCHC: 34.5 g/dL (ref 30.0–36.0)
MCV: 86.8 fL (ref 78.0–100.0)
Platelets: 250 10*3/uL (ref 150–400)
RBC: 4.47 MIL/uL (ref 3.87–5.11)
RDW: 14.3 % (ref 11.5–15.5)
WBC: 10 10*3/uL (ref 4.0–10.5)

## 2013-04-02 LAB — TSH: TSH: 0.783 u[IU]/mL (ref 0.350–4.500)

## 2013-04-02 LAB — HEMOGLOBIN A1C
Hgb A1c MFr Bld: 8.9 % — ABNORMAL HIGH (ref <5.7)
Mean Plasma Glucose: 209 mg/dL — ABNORMAL HIGH (ref <117)

## 2013-04-02 LAB — GLUCOSE, CAPILLARY
Glucose-Capillary: 145 mg/dL — ABNORMAL HIGH (ref 70–99)
Glucose-Capillary: 269 mg/dL — ABNORMAL HIGH (ref 70–99)
Glucose-Capillary: 243 mg/dL — ABNORMAL HIGH (ref 70–99)

## 2013-04-02 LAB — TROPONIN I: Troponin I: <0.3 ng/mL (ref <0.30)

## 2013-04-02 LAB — FOLATE RBC: RBC Folate: 766 ng/mL — ABNORMAL HIGH (ref >=366)

## 2013-04-02 MED ORDER — NICOTINE 21 MG/24HR TD PT24
21.0000 mg | MEDICATED_PATCH | Freq: Every day | TRANSDERMAL | Status: DC
  Administered 2013-04-02: 21 mg via TRANSDERMAL
  Filled 2013-04-02: qty 1

## 2013-04-02 MED ORDER — HEPARIN SODIUM (PORCINE) 5000 UNIT/ML IJ SOLN
5000.0000 [IU] | Freq: Three times a day (TID) | INTRAMUSCULAR | Status: DC
  Filled 2013-04-02 (×3): qty 1

## 2013-04-02 MED ORDER — INFLUENZA VAC SPLIT QUAD 0.5 ML IM SUSP: 0.5000 mL | INTRAMUSCULAR | Status: DC

## 2013-04-02 NOTE — Progress Notes (Signed)
VASCULAR LAB PRELIMINARY  PRELIMINARY  PRELIMINARY  PRELIMINARY  Carotid duplex  completed.    Preliminary report:  Bilateral:  1-39% ICA stenosis.  Vertebral artery flow is antegrade.      Elanie Hammitt, RVT 04/02/2013, 1:07 PM

## 2013-04-02 NOTE — Progress Notes (Signed)
  Echocardiogram 2D Echocardiogram has been performed.  Cathie Beams 04/02/2013, 12:07 PM

## 2013-04-02 NOTE — Evaluation (Signed)
Clinical/Bedside Swallow Evaluation Patient Details  Name: [name] MRN: [id number] Date of Birth: 1971-01-04  Today's Date: 04/02/2013 Time: 1150-1202 SLP Time Calculation (min): 12 min  Past Medical History:  Past Medical History  Diagnosis Date  . Asthma   . COPD (chronic obstructive pulmonary disease)   . Diabetes mellitus   . Renal disorder   . Hypertension   . Seizures   . Syncope   . Bipolar 1 disorder   . DVT (deep venous thrombosis)     2011- was on coumadin for a year  . Stroke     2010  . GERD (gastroesophageal reflux disease)   . Hyperlipidemia    Past Surgical History:  Past Surgical History  Procedure Laterality Date  . Laparoscopy      Endometriosis   HPI:  42 y.o. year old female presenting with 2-3 week history of generalized weakness, decreased B LE motor function and sensory changes and 3 day history of sharp substernal chest pain lasting 3-5 minutes . PMH is significant for Seizure disorder, prior TIA, poorly controlled DM, HTN and tobacco abuse.  Pt reports one-month hx of globus, with solid foods becoming lodged requiring liquids to "wash it down."   Assessment / Plan / Recommendation Clinical Impression  Pt presents with no signs of of an oropharyngeal dysphagia, but describes symptoms of esophageal difficulty- globus, heartburn, and solids becoming lodged in throat.  Recommend GI f/u as outpatient to r/o esophageal disorder.  No further SLP f/u warranted.  Rec continued regular diet, thin liquids.     Aspiration Risk  None    Diet Recommendation Regular;Thin liquid   Liquid Administration via: Cup;Straw Medication Administration: Whole meds with liquid Supervision: Patient able to self feed    Other  Recommendations Recommended Consults: Consider GI evaluation Oral Care Recommendations: Oral care BID   Follow Up Recommendations  None    Frequency and Duration            SLP Swallow Goals     Swallow Study Prior Functional  Status  Type of Home: Mobile home Available Help at Discharge: Family;Available 24 hours/day    General Date of Onset: 04/01/13 HPI: 42 y.o. year old female presenting with 2-3 week history of generalized weakness, decreased B LE motor function and sensory changes and 3 day history of sharp substernal chest pain lasting 3-5 minutes . PMH is significant for Seizure disorder, prior TIA, poorly controlled DM, HTN and tobacco abuse.  Pt reports one-month hx of globus, with solid foods becoming lodged requiring liquids to "wash it down." Type of Study: Bedside swallow evaluation Previous Swallow Assessment: none Diet Prior to this Study: Regular;Thin liquids Temperature Spikes Noted: No Respiratory Status: Room air Behavior/Cognition: Alert;Cooperative Oral Cavity - Dentition: Adequate natural dentition Self-Feeding Abilities: Able to feed self Patient Positioning: Upright in chair Baseline Vocal Quality: Clear Volitional Cough: Strong Volitional Swallow: Able to elicit    Oral/Motor/Sensory Function Overall Oral Motor/Sensory Function: Appears within functional limits for tasks assessed   Ice Chips Ice chips: Within functional limits   Thin Liquid Thin Liquid: Within functional limits    Nectar Thick Nectar Thick Liquid: Not tested   Honey Thick Honey Thick Liquid: Not tested   Puree Puree: Within functional limits   Solid   GO    Solid: Within functional limits       Blenda Mounts Laurice 04/02/2013,12:03 PM

## 2013-04-02 NOTE — Evaluation (Signed)
Occupational Therapy Evaluation and Discharge Summary Patient Details Name: [name] MRN: [id number] DOB: 1970-09-06 Today's Date: 04/02/2013 Time: 1478-2956 OT Time Calculation (min): 13 min  OT Assessment / Plan / Recommendation History of present illness [name] is a 42 y.o. year old female presenting with 2-3 week history of generalized weakness, decreased B LE motor function and sensory changes and 3 day history of sharp substernal chest pain lasting 3-5 minutes . PMH is significant for Seizure disorder, prior TIA, poorly controlled DM, HTN and tobacco abuse   Clinical Impression   Pt admitted for above chest pain and now seems to have returned to baseline.  Pt is doing well with adls and will get PT to follow up with recent falls.    OT Assessment  Patient does not need any further OT services    Follow Up Recommendations  No OT follow up    Barriers to Discharge      Equipment Recommendations  None recommended by OT (tub bench is on the way per pt.)    Recommendations for Other Services    Frequency       Precautions / Restrictions Precautions Precautions: Fall Precaution Comments: pt reports several recent falls backwards. Restrictions Weight Bearing Restrictions: No   Pertinent Vitals/Pain Pt reports no pain.    ADL  Eating/Feeding: Performed;Independent Where Assessed - Eating/Feeding: Chair Grooming: Performed;Supervision/safety Where Assessed - Grooming: Unsupported standing Upper Body Bathing: Simulated;Set up Where Assessed - Upper Body Bathing: Unsupported sitting Lower Body Bathing: Simulated;Set up Where Assessed - Lower Body Bathing: Unsupported sit to stand Upper Body Dressing: Performed;Supervision/safety Where Assessed - Upper Body Dressing: Unsupported sitting Lower Body Dressing: Performed;Set up Where Assessed - Lower Body Dressing: Unsupported sit to stand Toilet Transfer: Performed;Supervision/safety Toilet Transfer Method:   (walked to br) Toilet Transfer Equipment: Comfort height toilet;Grab bars Toileting - Clothing Manipulation and Hygiene: Performed;Supervision/safety Where Assessed - Toileting Clothing Manipulation and Hygiene: Sit to stand from 3-in-1 or toilet Transfers/Ambulation Related to ADLs: Pt walked in room with S. ADL Comments: Pt did well in room with all adls.  Pt states she has been falling a lot at home.  Rec use of walker if falls are her major deficits.    OT Diagnosis:    OT Problem List:   OT Treatment Interventions:     OT Goals(Current goals can be found in the care plan section) Acute Rehab OT Goals Patient Stated Goal: be able to walk without falling  Visit Information  Last OT Received On: 04/02/13 Assistance Needed: +1 History of Present Illness: [name] is a 42 y.o. year old female presenting with 2-3 week history of generalized weakness, decreased B LE motor function and sensory changes and 3 day history of sharp substernal chest pain lasting 3-5 minutes . PMH is significant for Seizure disorder, prior TIA, poorly controlled DM, HTN and tobacco abuse       Prior Functioning     Home Living Family/patient expects to be discharged to:: Private residence Living Arrangements: Spouse/significant other Available Help at Discharge: Family;Available 24 hours/day Type of Home: Mobile home Home Access: Stairs to enter Entrance Stairs-Number of Steps: 3 Entrance Stairs-Rails: Right;Left;Can reach both Home Layout: One level Home Equipment: Tub bench;Walker - 4 wheels;Wheelchair - manual Prior Function Level of Independence: Independent Communication Communication: No difficulties Dominant Hand: Right         Vision/Perception Vision - History Baseline Vision: No visual deficits Patient Visual Report: No change from baseline Vision - Assessment Vision Assessment:  Vision not tested   Cognition  Cognition Arousal/Alertness: Awake/alert Behavior During  Therapy: WFL for tasks assessed/performed Overall Cognitive Status: Within Functional Limits for tasks assessed    Extremity/Trunk Assessment Upper Extremity Assessment Upper Extremity Assessment: Overall WFL for tasks assessed Lower Extremity Assessment Lower Extremity Assessment: Defer to PT evaluation Cervical / Trunk Assessment Cervical / Trunk Assessment: Normal     Mobility Bed Mobility Bed Mobility: Not assessed Transfers Transfers: Sit to Stand;Stand to Sit Sit to Stand: 6: Modified independent (Device/Increase time);From chair/3-in-1;With armrests Stand to Sit: 6: Modified independent (Device/Increase time);To chair/3-in-1;With armrests Details for Transfer Assistance: no assist necessary     Exercise General Exercises - Lower Extremity Long Arc Quad: AROM;Both;15 reps;Seated Hip Flexion/Marching: AROM;Both;10 reps;Seated Toe Raises: AROM;Both;10 reps;Seated Heel Raises: AROM;Seated;Both;10 reps   Balance Balance Balance Assessed: Yes Static Sitting Balance Static Sitting - Balance Support: Feet supported;No upper extremity supported Static Sitting - Level of Assistance: 7: Independent Static Sitting - Comment/# of Minutes: 3 Static Standing Balance Static Standing - Balance Support: No upper extremity supported Static Standing - Level of Assistance: 6: Modified independent (Device/Increase time) Static Standing - Comment/# of Minutes: 3 Single Leg Stance - Right Leg: 2 Single Leg Stance - Left Leg: 3 Rhomberg - Eyes Opened: 60 Rhomberg - Eyes Closed: 20 High Level Balance High Level Balance Activites: Direction changes;Turns High Level Balance Comments: pt able to turn 360 degrees in 4 sec   End of Session OT - End of Session Activity Tolerance: Patient tolerated treatment well Patient left: in chair;with call bell/phone within reach Nurse Communication: Mobility status  GO     Hope Budds 04/02/2013, 11:43 AM [phone]

## 2013-04-02 NOTE — Progress Notes (Signed)
Attending Addendum  I examined the patient and discussed the assessment and plan with Dr. Gayla Doss. I have reviewed the note and agree.  The patient has been ruled out from a cardiac standpoint. I agree with CXR in house. Will have cardiology f/u for stress testing.   Regarding b/l leg numbness this appears to be sequela of diabetes instead of CVA or post ictal state. No need for EEG. Carotid dopplers can be obtained on the outpatient setting.   Plan to discharge this PM once CXR done. Patient will mainly need f/u for smoking cessation and diabetes (followed by Endocrinology).     Dessa Phi, MD FAMILY MEDICINE TEACHING SERVICE

## 2013-04-02 NOTE — Progress Notes (Signed)
Pt also given her Invokana & Dulera inhaler @ discharge

## 2013-04-02 NOTE — Care Management Note (Unsigned)
    Page 1 of 1   04/02/2013     4:15:41 PM   CARE MANAGEMENT NOTE 04/02/2013  Patient:  [name],[name]   Account Number:  [id number]  Date Initiated:  04/02/2013  Documentation initiated by:  Krystyna Cleckley  Subjective/Objective Assessment:   PT ADM ON 04/01/13 WITH CHEST PAIN AND WEAKNESS.  PTA, PT INDEPENDENT OF ADLS.     Action/Plan:   WILL FOLLOW FOR DISCHARGE NEEDS AS PT PROGRESSES.   Anticipated DC Date:  04/04/2013   Anticipated DC Plan:  HOME/SELF CARE      DC Planning Services  CM consult      Choice offered to / List presented to:             Status of service:  In process, will continue to follow Medicare Important Message given?   (If response is "NO", the following Medicare IM given date fields will be blank) Date Medicare IM given:   Date Additional Medicare IM given:    Discharge Disposition:    Per UR Regulation:  Reviewed for med. necessity/level of care/duration of stay  If discussed at Long Length of Stay Meetings, dates discussed:    Comments:

## 2013-04-02 NOTE — Progress Notes (Signed)
Subjective:  No further chest pain. Anxious to go home.  Telemetry shows sinus tachycardia. 2D echo pending.  Objective:  Vital Signs in the last 24 hours: Temp:  [97.6 F (36.4 C)-98.4 F (36.9 C)] 98.4 F (36.9 C) (09/23 0430) Pulse Rate:  [87-100] 90 (09/23 0430) Resp:  [18] 18 (09/23 0430) BP: (115-133)/(62-81) 115/62 mmHg (09/23 0430) SpO2:  [96 %-98 %] 98 % (09/23 0430) Weight:  [232 lb 2.3 oz (105.3 kg)-232 lb 5.8 oz (105.4 kg)] 232 lb 2.3 oz (105.3 kg) (09/23 0500)  Intake/Output from previous day: 09/22 0701 - 09/23 0700 In: 600 [P.O.:600] Out: 400 [Urine:400] Intake/Output from this shift: Total I/O In: 240 [P.O.:240] Out: -   . atorvastatin  80 mg Oral q1800  . Canagliflozin  300 mg Oral q morning - 10a  . clonazePAM  1 mg Oral TID  . colesevelam  1,250 mg Oral BID WC  . FLUoxetine  40 mg Oral Daily  . gabapentin  300 mg Oral TID  . heparin subcutaneous  5,000 Units Subcutaneous Q8H  . insulin regular human CONCENTRATED  100 Units Subcutaneous Q supper  . insulin regular human CONCENTRATED  200 Units Subcutaneous Q breakfast  . lamoTRIgine  200 mg Oral QHS  . lisinopril  10 mg Oral QHS  . mometasone-formoterol  2 puff Inhalation BID  . nicotine  7 mg Transdermal Daily  . QUEtiapine  800 mg Oral QHS  . sodium chloride  3 mL Intravenous Q12H  . sodium chloride  3 mL Intravenous Q12H      Physical Exam: The patient appears to be in no distress.  Head and neck exam reveals that the pupils are equal and reactive.  The extraocular movements are full.  There is no scleral icterus.  Mouth and pharynx are benign.  No lymphadenopathy.  No carotid bruits.  The jugular venous pressure is normal.  Thyroid is not enlarged or tender.  Chest is clear to percussion and auscultation.  No rales or rhonchi.  Expansion of the chest is symmetrical.  Heart reveals no abnormal lift or heave.  First and second heart sounds are normal.  There is no murmur gallop rub or  click.  The abdomen is soft and nontender.  Bowel sounds are normoactive.  There is no hepatosplenomegaly or mass.  There are no abdominal bruits.  Extremities reveal no phlebitis or edema.  Pedal pulses are good.  There is no cyanosis or clubbing.  Neurologic exam is normal strength and no lateralizing weakness.  No sensory deficits.  Integument reveals no rash  Lab Results:  Recent Labs  04/01/13 1609 04/02/13 0108  WBC 10.2 10.0  HGB 13.7 13.4  PLT 281 250    Recent Labs  04/01/13 1609 04/02/13 0108  NA 137 140  K 3.9 4.0  CL 100 103  CO2 26 28  GLUCOSE 193* 168*  BUN 10 10  CREATININE 0.82 0.79    Recent Labs  04/01/13 2045 04/02/13 0108  TROPONINI <0.30 <0.30   Hepatic Function Panel  Recent Labs  04/01/13 1609  PROT 7.8  ALBUMIN 3.6  AST 22  ALT 23  ALKPHOS 96  BILITOT 0.2*   No results found for this basename: CHOL,  in the last 72 hours No results found for this basename: PROTIME,  in the last 72 hours  Imaging: Ct Head Wo Contrast  04/02/2013   CLINICAL DATA:  Headache with chest pain. Seizures with multiple falls.  EXAM: CT HEAD WITHOUT CONTRAST  TECHNIQUE: Contiguous axial images were obtained from the base of the skull through the vertex without intravenous contrast.  COMPARISON:  Head CT 10/08/2011.  FINDINGS: There is no evidence of acute intracranial hemorrhage, mass lesion, brain edema or extra-axial fluid collection. The ventricles and subarachnoid spaces are appropriately sized for age. There is no CT evidence of acute cortical infarction.  The visualized paranasal sinuses, mastoid air cells and middle ears are clear. Metallic foreign body within the left frontal scalp is again noted. The calvarium is intact.  IMPRESSION: Stable examination. No acute intracranial or findings.   Electronically Signed   By: Roxy Horseman   On: 04/02/2013 00:19   No recent chest xray in Epic.  Cardiac Studies: 2D echo pending. Assessment/Plan:  1 chest  pain-symptoms are chronic and extremely atypical. The EKG shows no ST changes. Enzymes are normal.  2 lower extremity weakness-management and evaluation per primary care.  3 tobacco abuse-patient counseled on discontinuing.  4 syncope-previous evaluation negative. Most recent event occurred immediately after pulling tooth and most likely vagally mediated. Follow on telemetry.  5 diabetes mellitus-management per primary care.  Plan: Will get chest xray. Await results of 2D echo not yet done. If above two tests are normal she could be discharged later today and return to our office for a lexiscan myoview stress test as outpatient.  LOS: 1 day    Cassell Clement 04/02/2013, 9:13 AM

## 2013-04-02 NOTE — Progress Notes (Signed)
Inpatient Diabetes Program Recommendations  AACE/ADA: New Consensus Statement on Inpatient Glycemic Control (2013)  Target Ranges:  Prepandial:   less than 140 mg/dL      Peak postprandial:   less than 180 mg/dL (1-2 hours)      Critically ill patients:  140 - 180 mg/dL   Reason for Visit: Discrepancies in patient's reported U-500 dosages   Note:  Noted that dosage of U-500 from Med Rec data differed from MD note.  Visited patient in room to clarify home U-500 dosages.    Patient states that she takes Regular U-500 insulin 24 units at 9 to 10 AM every morning and 30 units around 4 PM.  Added that she has taken as much as 40 to 45 units if her CBG's were running in the 400's, but has not had to do that in a long time.  Since U-500 is 5 times standard insulin concentration, her dosages translate to 120 actual units in the morning and 150 actual units in the evening.  Patient states that Dr. Tinnie Gens manages her diabetes and regulates her U-500 dosages.  (She really means Dr. Talmage Coin-- confirmed that she is indeed his patient by phone.)  Patient received the equivalent of 200 units of U-500 this morning.  Spoke with nurse regarding potential for associated hypoglycemia.  Paged Family Medicine Teaching Service to inform of discrepancy.  If patient has to be NPO, use of U-500 would not be recommended since it acts as both a prandial and basal insulin.  Suggest Dr. Sharl Ma be consulted regarding U-500 adjustments.  Thank you.  Doniel Maiello S. Elsie [name], RN, CNS, CDE Inpatient Diabetes Program, team pager [phone]

## 2013-04-02 NOTE — Progress Notes (Signed)
Pt D/C home, RN went over D/C instructions, & Pt  verbalized understanding. Gave Pt her insulin med (U500) from home with pharmacy. Pt A/O x 3,,no signs of distress noted.  Pt left with Family going home.

## 2013-04-02 NOTE — Discharge Summary (Signed)
Family Medicine Teaching [facility] Discharge Summary  Patient name: [name] Medical record number: [id number] Date of birth: 1970-08-30 Age: 42 y.o. Gender: female Date of Admission: 04/01/2013  Date of Discharge: 04/02/13 Admitting Physician: Lora Paula, MD  Primary Care Provider: Gaspar Bidding, DO Consultants: neurology, cardiology  Indication for Hospitalization: evaluation for possible stroke   Discharge Diagnoses/Problem List:  Patient Active Problem List   Diagnosis Date Noted  . Numbness and tingling 04/01/2013  . Atypical chest pain 04/01/2013  . Abnormal sensation of lower extremity 04/01/2013  . Foot swelling 12/11/2012  . Elevated BP 05/29/2012  . Sleep apnea 05/22/2012  . Syncope 10/09/2011  . Sinus tachycardia 10/09/2011  . Seizure disorder 10/09/2011  . Bipolar disorder 10/09/2011  . Tobacco dependence 10/09/2011  . Seizures   . TIA (transient ischemic attack) 06/22/2011  . Constipation, chronic 06/15/2011  . UNSPECIFIED DISORDER TEETH&SUPPORTING STRUCTURES 08/16/2010  . DIABETIC PERIPHERAL NEUROPATHY 06/08/2010  . HYPERTRIGLYCERIDEMIA 07/17/2007  . Anxiety state, unspecified 05/30/2007  . COPD 05/30/2007  . OBESITY NOS 04/23/2007  . DM (diabetes mellitus), type 2, uncontrolled 03/22/2007  . TOBACCO DEPENDENCE 09/07/2006  . ASTHMA, PERSISTENT 09/07/2006    Disposition: home  Discharge Condition: good  Discharge Exam: BP 120/76  Pulse 90  Temp(Src) 98.5 F (36.9 C) (Oral)  Resp 18  Ht 5\' 4"  (1.626 m)  Wt 232 lb 2.3 oz (105.3 kg)  BMI 39.83 kg/m2  SpO2 98%  LMP 11/11/2011 Physical Exam:  Gen: alert, cooperative, sitting in bed comfortably  HEENT: EOMI, MMM, no pharyngeal erythema or exudate  CV: RRR, no murmurs  Pulm CTAB no wheezing  Ext: no edema, 2+ DP pulses; no sensation in either leg below the knee, unable to elicit babinski on either side; patellar reflexes absent b/l   Brief Hospital Course:  [name] is a 42  y.o. year old female presenting with 2-3 week history of generalized weakness, decreased B LE motor function and sensory changes and 3 day history of sharp substernal chest pain lasting 3-5 minutes . PMH is significant for Seizure disorder, prior TIA, poorly controlled DM, HTN and tobacco abuse.   # peripheral neuropathy Transferred from clinic with non-focal weakness, areflexia, decreased LE sensation, LE discoordination, confusion and slight slurring of speech, and a history of TIA and seizure disorder  Admitted for stroke evaluation.  An MRI was done but could not be read 2/2 to artifact.  CT showed no acute intracranial findings.  Echocardiogram, carotid dopplers WNL.  No history of significant hypoglycemia. No episodes concerning for seizure while inpatient and EEG on day of discharge was within normal limits. The lower extremity symptoms and here history are consistent with a diabetic peripheral neuropathy.  # Atypical Chest Pain: Pt reported intermittent stabbing chest pain for 2-3 weeks prior to presentation and has multiple risk factors for CAD.  Troponins were negative x 3.  proBNP: 24.7  EKG not concerning for acute cardiac pathology.  Echocardiogram was WNL. She was seen by cardiology who recommend a functional study as an outpatient.  Chest x-ray showed no acute findings  Asymptomatic at time of discharge.  Will continue ASA.  # Tobacco abuse Current smoker, was given nicotine patch and cessation counseling during admission.  # Diabetes Poorly controlled.  A1c 8.9  Was managed with home Invokana and U500 while inpatient.  Will need outpatient f/u for better control.    # hyperlipidemia Lipid 01/2013: Tchol 204, Tri 658, HDL 34, LDL not calc, continue statin, continue welchol for  prevention of pancreatitis 2/2 hypertriglyceridemia   # HTN: continue home meds  # COPD: continue home meds, smoking cessation as above  # Depression: continue home meds   Issues for Follow Up:  -outpatient  cardiology functional study -diabetes control -management of peripheral neuropathy symptoms - Smoking cessation  Significant Procedures:  EEG  Significant Labs and Imaging:   Recent Labs Lab 04/01/13 1609 04/02/13 0108  WBC 10.2 10.0  HGB 13.7 13.4  HCT 39.5 38.8  PLT 281 250    Recent Labs Lab 04/01/13 1609 04/02/13 0108  NA 137 140  K 3.9 4.0  CL 100 103  CO2 26 28  GLUCOSE 193* 168*  BUN 10 10  CREATININE 0.82 0.79  CALCIUM 9.3 9.1  ALKPHOS 96  --   AST 22  --   ALT 23  --   ALBUMIN 3.6  --    Lipid Panel     Component Value Date/Time   CHOL 204* 02/04/2013 1003   TRIG 658* 02/04/2013 1003   HDL 34* 02/04/2013 1003   CHOLHDL 6.0 02/04/2013 1003   VLDL NOT CALC 02/04/2013 1003   LDLCALC NOT CALC 02/04/2013 1003   Lab Results  Component Value Date   TSH 0.783 04/01/2013    Ct Head Wo Contrast  04/02/2013  IMPRESSION: Stable examination. No acute intracranial or findings.    EEG 04/02/13  This is a normal EEG recording during wakefulness and during sleep. No evidence of an epileptic disorder was demonstrated.  Carotid duplex 04/02/13  Preliminary report: Bilateral: 1-39% ICA stenosis. Vertebral artery flow is antegrade.   CXR IMPRESSION:  1. No acute findings.  2. Thoracic spondylosis.  3. 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   Results/Tests Pending at Time of Discharge:  -final interpretation of carotid dopplers -B12 level  Discharge Medications:    Medication List    STOP taking these medications       ibuprofen 600 MG tablet  Commonly known as:  ADVIL,MOTRIN      TAKE these medications       ADVAIR DISKUS 500-50 MCG/DOSE Aepb  Generic drug:  Fluticasone-Salmeterol  Inhale 1 puff into the lungs 2 (two) times daily.     albuterol 108 (90 BASE) MCG/ACT inhaler  Commonly known as:  PROVENTIL HFA;VENTOLIN HFA  Inhale 2 puffs into the lungs every 4 (four) hours as needed. For shortness of breath     aspirin EC 81 MG tablet   Take 81 mg by mouth every morning.     clonazePAM 1 MG tablet  Commonly known as:  KLONOPIN  Take 1 mg by mouth 3 (three) times daily.     colesevelam 625 MG tablet  Commonly known as:  WELCHOL  Take 1,250 mg by mouth 2 (two) times daily with a meal.     FLUoxetine 40 MG capsule  Commonly known as:  PROZAC  Take 40 mg by mouth daily.     gabapentin 300 MG capsule  Commonly known as:  NEURONTIN  Take 1 capsule (300 mg total) by mouth 3 (three) times daily.     insulin regular human CONCENTRATED 500 UNIT/ML Soln injection  Commonly known as:  HUMULIN R  Inject 24-30 Units into the skin 2 (two) times daily with a meal. Pt will inject 24 units every morning and 30 units in the evening ONLY IF blood sugar is above 115.     INVOKANA 300 MG Tabs  Generic drug:  Canagliflozin  Take 300 mg by mouth  every morning.     lamoTRIgine 200 MG tablet  Commonly known as:  LAMICTAL  Take 200 mg by mouth at bedtime.     lisinopril 10 MG tablet  Commonly known as:  PRINIVIL,ZESTRIL  Take 10 mg by mouth at bedtime.     QUEtiapine 400 MG 24 hr tablet  Commonly known as:  SEROQUEL XR  Take 800 mg by mouth at bedtime.     rosuvastatin 40 MG tablet  Commonly known as:  CRESTOR  Take 40 mg by mouth at bedtime.     traMADol 50 MG tablet  Commonly known as:  ULTRAM  Take 100 mg by mouth 2 (two) times daily.       Discharge Instructions: Please refer to Patient Instructions section of EMR for full details.  Patient was counseled important signs and symptoms that should prompt return to medical care, changes in medications, dietary instructions, activity restrictions, and follow up appointments.   Follow-Up Appointments:   Follow-up Information   Schedule an appointment as soon as possible for a visit with RIGBY, MICHAEL, DO.   Specialty:  Family Medicine   Contact information:   [address] [place] [address] [phone]       Schedule an appointment as soon as possible for  a visit with Cassell Clement, MD. (For cardiac stress test)    Specialty:  Cardiology   Contact information:   [address] [place] [address] [phone]      Wenda Low, MD 04/02/2013, 4:46 PM PGY-1, [facility]

## 2013-04-02 NOTE — Progress Notes (Signed)
EEG completed; results pending.    

## 2013-04-02 NOTE — Evaluation (Signed)
Physical Therapy Evaluation Patient Details Name: [name] MRN: [id number] DOB: 03-04-1971 Today's Date: 04/02/2013 Time: 4098-1191 PT Time Calculation (min): 22 min  PT Assessment / Plan / Recommendation History of Present Illness  [name] is a 42 y.o. year old female presenting with 2-3 week history of generalized weakness, decreased B LE motor function and sensory changes and 3 day history of sharp substernal chest pain lasting 3-5 minutes . PMH is significant for Seizure disorder, prior TIA, poorly controlled DM, HTN and tobacco abuse  Clinical Impression  Pt reports increased weakness over last couple weeks even though long standing difficulty with mobility x 6 years. Pt with impaired balance and generalized bil LE weakness but pt did not appear to attempt to resist with myotome testing. Will follow acutely to maximize mobility, balance and strength with recommendation for OPPT to maximize function.     PT Assessment  Patient needs continued PT services    Follow Up Recommendations  Outpatient PT    Does the patient have the potential to tolerate intense rehabilitation      Barriers to Discharge        Equipment Recommendations  None recommended by PT    Recommendations for Other Services     Frequency Min 3X/week    Precautions / Restrictions Precautions Precautions: Fall   Pertinent Vitals/Pain No pain      Mobility  Bed Mobility Bed Mobility: Not assessed Transfers Transfers: Sit to Stand;Stand to Sit Sit to Stand: 6: Modified independent (Device/Increase time);From chair/3-in-1;With armrests Stand to Sit: 6: Modified independent (Device/Increase time);To chair/3-in-1;With armrests Ambulation/Gait Ambulation/Gait Assistance: 4: Min guard Ambulation Distance (Feet): 300 Feet Assistive device: None Ambulation/Gait Assistance Details: 2 partial posterior LOB pt corrected without assist but endorses 4 recent falls Gait Pattern: Step-through  pattern;Decreased stride length Gait velocity: decr Stairs: Yes Stairs Assistance: 4: Min guard Stair Management Technique: Two rails;Alternating pattern Number of Stairs: 3    Exercises General Exercises - Lower Extremity Long Arc Quad: AROM;Both;15 reps;Seated Hip Flexion/Marching: AROM;Both;10 reps;Seated Toe Raises: AROM;Both;10 reps;Seated Heel Raises: AROM;Seated;Both;10 reps   PT Diagnosis: Difficulty walking  PT Problem List: Decreased strength;Decreased activity tolerance;Decreased balance PT Treatment Interventions:       PT Goals(Current goals can be found in the care plan section) Acute Rehab PT Goals Patient Stated Goal: be able to walk without falling PT Goal Formulation: With patient Time For Goal Achievement: 04/16/13 Potential to Achieve Goals: Fair  Visit Information  Last PT Received On: 04/02/13 Assistance Needed: +1 History of Present Illness: [name] is a 42 y.o. year old female presenting with 2-3 week history of generalized weakness, decreased B LE motor function and sensory changes and 3 day history of sharp substernal chest pain lasting 3-5 minutes . PMH is significant for Seizure disorder, prior TIA, poorly controlled DM, HTN and tobacco abuse       Prior Functioning  Home Living Family/patient expects to be discharged to:: Private residence Living Arrangements: Spouse/significant other Available Help at Discharge: Family;Available 24 hours/day Type of Home: Mobile home Home Access: Stairs to enter Entrance Stairs-Number of Steps: 3 Entrance Stairs-Rails: Right;Left;Can reach both Home Layout: One level Home Equipment: Walker - 4 wheels;Wheelchair - manual Prior Function Level of Independence: Independent Communication Communication: No difficulties    Cognition  Cognition Arousal/Alertness: Awake/alert Behavior During Therapy: WFL for tasks assessed/performed Overall Cognitive Status: Within Functional Limits for tasks assessed     Extremity/Trunk Assessment Upper Extremity Assessment Upper Extremity Assessment: Overall WFL for tasks assessed  Lower Extremity Assessment Lower Extremity Assessment: Generalized weakness (3/5 all myotomes, unclear if pt actually attempting to resis) Cervical / Trunk Assessment Cervical / Trunk Assessment: Normal   Balance Balance Balance Assessed: Yes Static Sitting Balance Static Sitting - Balance Support: Feet supported;No upper extremity supported Static Sitting - Level of Assistance: 7: Independent Static Sitting - Comment/# of Minutes: 3 Static Standing Balance Static Standing - Balance Support: No upper extremity supported Static Standing - Level of Assistance: 6: Modified independent (Device/Increase time) Static Standing - Comment/# of Minutes: 3 Single Leg Stance - Right Leg: 2 Single Leg Stance - Left Leg: 3 Rhomberg - Eyes Opened: 60 Rhomberg - Eyes Closed: 20 High Level Balance High Level Balance Activites: Direction changes;Turns High Level Balance Comments: pt able to turn 360 degrees in 4 sec  End of Session PT - End of Session Equipment Utilized During Treatment: Gait belt Activity Tolerance: Patient tolerated treatment well Patient left: in chair;with call bell/phone within reach;with family/visitor present Nurse Communication: Mobility status  GP     Delorse Lek 04/02/2013, 10:25 AM Delaney Meigs, PT [phone]

## 2013-04-02 NOTE — Procedures (Signed)
ELECTROENCEPHALOGRAM REPORT   Patient: [name]       Room #: 1O10 EEG No. ID: [id number] Age: 42 y.o.        Sex: female Referring Physician: Armen Pickup Report Date:  04/02/2013        Interpreting Physician: Aline Brochure  History: [name] is an 42 y.o. female history of spells of loss of consciousness, likely syncopal in region.  Indications for study:  Rule out seizure disorder.  Technique: This is an 18 channel routine scalp EEG performed at the bedside with bipolar and monopolar montages arranged in accordance to the international 10/20 system of electrode placement.   Description: This EEG recording was performed during wakefulness and during sleep. Background activity during wakefulness consists of 9 Hz symmetrical alpha rhythm recorded from the posterior head regions with good attenuation of alpha activity with eye opening. Photic stimulation produced a symmetrical occipital driving response. Hyperventilation produced normal symmetrical generalized transient slowing response. There was slowing of background activity during sleep symmetrical delta and theta activity diffusely , as well as normal vertex waves, sleep spindles and arousal responses. No epileptiform discharges occurred during wakefulness nor during sleep. There was no abnormal slowing.  Interpretation:  This is a normal EEG recording during wakefulness and during sleep. No evidence of an epileptic disorder was demonstrated.   Venetia Maxon M.D. [facility] [phone]

## 2013-04-02 NOTE — H&P (Signed)
FMTS Attending Admission Note: Denny Levy MD [phone] pager office [phone] I  have seen and examined this patient, reviewed their chart. I have discussed this patient with the resident. I agree with the resident's findings, assessment and care plan. See my separate note (brief admit note)

## 2013-04-02 NOTE — Progress Notes (Signed)
Family Medicine Teaching Service Daily Progress Note Intern Pager: [phone]  Patient name: [name] Medical record number: [id number] Date of birth: 1970-08-15 Age: 42 y.o. Gender: female  Primary Care Provider: Gaspar Bidding, DO Consultants: Cardiology Code Status: Full  Pt Overview and Major Events to Date:   Assessment and Plan: [name] is a 42 y.o. year old female presenting with 2-3 week history of generalized weakness, decreased B LE motor function and sensory changes and 3 day history of sharp substernal chest pain lasting 3-5 minutes . PMH is significant for Seizure disorder, prior TIA, poorly controlled DM, HTN and tobacco abuse.   # NEURO SYMPTOMS (non-focal weakness, areflexia, decreased LE sensation, LE discortination, confusion and slight slurring of speech, hx of seizure disorder). Plan to admit to IP for stroke eval.  - I am concerned for a primary Neurologic event specifically cerebellar CVA. If workup negative consider MRI of spine to eval for demylenating disease  - MRI/A Brain: Unable to read due to artifact - CT head: Stable examination. No acute intracranial or findings. - will add B12 and folate to evaluate for peripheral neuropathy [ ]  2D ECHO, Carotid doppler, EEG: pending  > consider medication effect but no significant changes in meds. No reported significant hypoglyemia   # Chest Pain: Pt has multiple risk factors for CAD can concerning symptoms. Currently symptom free. Consider Cardiology evaluation given symptoms. Pt likely needs nuclear study vs CATH given risk factors and symptoms  - Continue ASA. EKG Admit: NSR, QTc 480 - STOP IBUPROFEN  - A1c 8.9, TSH wnl, Lipid 7/20014: Tchol 204, Tri 658, HDL 34, LDL not calc - proBNP: 24.7 - Trop neg x 3 [ ]  AM ECG: pending - ON Telemetry  # DM: poorly controlled  - continue home invokana  - U-500 at decreased dose (200 qAM, 100 qHS)   # HLD: poorly controlled on last check  - continue statin  -  continue welchol for prevention of pancreatitis   # HTN: continue home meds  # COPD: continue home meds, Nicotine patch # Depression: continue home meds   FEN/GI: cardiac diet   Prophylaxis: Heparin SQ   Disposition: Home pending neg cardiac and Neuro w/o  Subjective: Denies any CP, SOB this morning. Asking to go home  Objective: Temp:  [97.4 F (36.3 C)-98.4 F (36.9 C)] 98.4 F (36.9 C) (09/23 0430) Pulse Rate:  [87-100] 90 (09/23 0430) Resp:  [18] 18 (09/23 0430) BP: (115-133)/(62-81) 115/62 mmHg (09/23 0430) SpO2:  [96 %-98 %] 98 % (09/23 0430) Weight:  [227 lb (102.967 kg)-232 lb 5.8 oz (105.4 kg)] 232 lb 2.3 oz (105.3 kg) (09/23 0500) Physical Exam: Gen: alert, cooperative, sitting in bed comfortably  HEENT: EOMI, MMM, no pharyngeal erythema or exudate  CV: RRR, no murmurs  Pulm CTAB no wheezing  Ext: no edema, 2+ DP pulses; no sensation in either leg below the knee, unable to elicit babinski on either side; patellar reflexes absent b/l   Laboratory:  Recent Labs Lab 04/01/13 1609 04/02/13 0108  WBC 10.2 10.0  HGB 13.7 13.4  HCT 39.5 38.8  PLT 281 250    Recent Labs Lab 04/01/13 1609 04/02/13 0108  NA 137 140  K 3.9 4.0  CL 100 103  CO2 26 28  BUN 10 10  CREATININE 0.82 0.79  CALCIUM 9.3 9.1  PROT 7.8  --   BILITOT 0.2*  --   ALKPHOS 96  --   ALT 23  --   AST 22  --  GLUCOSE 193* 168*    CT Head IMPRESSION:  Stable examination. No acute intracranial or findings.  Wenda Low, MD 04/02/2013, 7:24 AM PGY-1, [facility] Family Medicine FPTS Intern pager: [phone], text pages welcome

## 2013-04-03 NOTE — Discharge Summary (Signed)
Attending Addendum  I examined the patient and discussed the discharge plan with Dr. Gayla Doss. I have reviewed the note and agree.  The patient was admitted for chest pain rule out and acute worsening of bilateral lower extremity neuropathy.   1. Chest pain: rule out with neg trop x 3. EKG unchanged. Evaluated by cardiology who recommended out patient stress test.   2. Bilateral lower extremity numbness: persistent. No weakness. Patient's gait and strength were unaffected on date of discharge. CT head negative. Unable to obtain MRI due to artifact (BB in scalp). Her exam was non focal and therefore the suspicion for CVA/stroke was low. Carotid dopplers and EEG were cancelled.   3. Smoking: 1 PPD. Entire family including husband and son smoke. Patient is willing to consider quitting. Recommended patient f/u in clinic for smoking cessation.   4. DM type 2: A1c 8.9. A1c 7. 7.7 at her last Endocrinologist visit on 03/28/13. Plan for her to f/u with her endocrinologist. Continued home regimen.     Dessa Phi, MD FAMILY MEDICINE TEACHING SERVICE

## 2013-04-05 LAB — VITAMIN B12: Vitamin B-12: 408 pg/mL (ref 211–911)

## 2013-04-09 ENCOUNTER — Telehealth: Payer: Self-pay | Admitting: Sports Medicine

## 2013-04-09 ENCOUNTER — Other Ambulatory Visit: Payer: Self-pay | Admitting: Sports Medicine

## 2013-04-09 MED ORDER — TRAMADOL HCL 50 MG PO TABS: 100.0000 mg | ORAL_TABLET | Freq: Two times a day (BID) | ORAL | Status: DC

## 2013-04-09 NOTE — Telephone Encounter (Signed)
Re faxed out.[name]

## 2013-04-09 NOTE — Telephone Encounter (Signed)
Dr Berline Chough had completed authorization for the shower chair. [facility] says they have never received the paperwork Please refax to [phone] Please let pt when this is done

## 2013-04-16 ENCOUNTER — Ambulatory Visit (INDEPENDENT_AMBULATORY_CARE_PROVIDER_SITE_OTHER): Payer: Managed Care, Other (non HMO) | Admitting: Sports Medicine

## 2013-04-16 ENCOUNTER — Encounter: Payer: Self-pay | Admitting: Sports Medicine

## 2013-04-16 VITALS — BP 122/74 | HR 105 | Temp 98.0°F | Ht 64.0 in | Wt 228.4 lb

## 2013-04-16 DIAGNOSIS — F17200 Nicotine dependence, unspecified, uncomplicated: Secondary | ICD-10-CM

## 2013-04-16 DIAGNOSIS — R569 Unspecified convulsions: Secondary | ICD-10-CM

## 2013-04-16 DIAGNOSIS — E781 Pure hyperglyceridemia: Secondary | ICD-10-CM

## 2013-04-16 DIAGNOSIS — IMO0001 Reserved for inherently not codable concepts without codable children: Secondary | ICD-10-CM

## 2013-04-16 DIAGNOSIS — E1149 Type 2 diabetes mellitus with other diabetic neurological complication: Secondary | ICD-10-CM

## 2013-04-16 DIAGNOSIS — F411 Generalized anxiety disorder: Secondary | ICD-10-CM

## 2013-04-16 DIAGNOSIS — R079 Chest pain, unspecified: Secondary | ICD-10-CM

## 2013-04-16 DIAGNOSIS — R030 Elevated blood-pressure reading, without diagnosis of hypertension: Secondary | ICD-10-CM

## 2013-04-16 DIAGNOSIS — R0789 Other chest pain: Secondary | ICD-10-CM

## 2013-04-16 DIAGNOSIS — I359 Nonrheumatic aortic valve disorder, unspecified: Secondary | ICD-10-CM

## 2013-04-16 DIAGNOSIS — E1165 Type 2 diabetes mellitus with hyperglycemia: Secondary | ICD-10-CM

## 2013-04-16 MED ORDER — CLONAZEPAM 1 MG PO TABS: 0.5000 mg | ORAL_TABLET | Freq: Two times a day (BID) | ORAL | Status: DC

## 2013-04-16 NOTE — Progress Notes (Signed)
[facility] [name] - 42 y.o. female MRN [id number]  Date of birth: 07/12/1970  CC, HPI, Interval History & ROS  [name] presents today for Hospitalization Follow-up   Recent Labs  02/04/13 1003 04/01/13 1609  HGBA1C  --  8.9*  TRIG 658*  --   CHOL 204*  --   HDL 34*  --   LDLCALC NOT CALC  --   TSH  --  0.783    1-39% Carotid stenosis Needs cardiology follow up Mild chest pain only, nonexertional, relieved with Alka-seltzer  Not able to walk around at [employer]  Klonopin for seizures but could reduce Neurontin 300mg  tid First seizure in 3 months earlier this week   Pertinent History & Care Coordination   Anderia's major active medical problems include: # CV Disease: hx of TIA, Sinus Tachycardia, HTN, HLD, DM, tobacco abuse, OSA, poor dentention  Poorly controlled chronic diseases  Not followed recently by cardiology # Uncontrolled DM: with peripheral neuropathy  Managed by Endocrinology - Sharl Ma  Most recent A1c (outside lab) 01/23/13 = 7.7 # Neuropsych: Seizure Disorder, Bipolar Disorder, Anxiety  Followed by Dr. Tomasa Rand # RESP: COPD, Asthma, Sleep Apnea,  Other Pertinent Med/Surg/Hosp History: # History of laparoscopy for endometriosis   Follow up Issues:  Smoking       Health Maintenance Due  Topic  . Influenza Vaccine    History  Smoking status  . Current Every Day Smoker -- 1.00 packs/day for 20 years  . Types: Cigarettes  Smokeless tobacco  . Not on file   History   Social History Narrative   Lives in [place]   Married but lives with Boyfriend - not legally separated   Disabled - for BiPolar, Seizure disorder, diabetes   Formerly worked at [employer]          Otherwise please see associated EMR sections for complete problem List, past medical history, past surgical history, family history and social history. Objective Findings  VITALS: HR: 105 bpm  BP: 122/74 mmHg  TEMP: 98 F (36.7 C) (Oral)  RESP:   HT:  5\' 4"  (162.6 cm)  WT: 228 lb 6.4 oz (103.602 kg)  BMI: Body mass index is 39.19 kg/(m^2).   BP Readings from Last 3 Encounters:  04/16/13 122/74  04/02/13 137/73  04/01/13 125/64   Wt Readings from Last 3 Encounters:  04/16/13 228 lb 6.4 oz (103.602 kg)  04/02/13 232 lb 2.3 oz (105.3 kg)  04/01/13 227 lb (102.967 kg)     PHYSICAL EXAM: GENERAL:  adult Caucasian female. In no discomfort; no respiratory distress  PSYCH: alert and interactive however somnolent.  Blunted affect.  Limited insight   HNEENT:   CARDIO: RRR, S1/S2 heard, 2/6 SEM at right upper sternal border   LUNGS:  scattered wheezes throughout, no basilar crackles   ABDOMEN:   EXTREM:  Warm, well perfused.  Moves all 4 extremities spontaneously; no lateralization.  No noted foot lesions.  Distal pulses absetn.  trace pretibial edema.  GU:   SKIN:    Medications & Orders   Previous Medications   ALBUTEROL (PROVENTIL HFA;VENTOLIN HFA) 108 (90 BASE) MCG/ACT INHALER    Inhale 2 puffs into the lungs every 4 (four) hours as needed. For shortness of breath   ASPIRIN EC 81 MG TABLET    Take 81 mg by mouth every morning.   CANAGLIFLOZIN (INVOKANA) 300 MG TABS    Take 300 mg by mouth every morning.   COLESEVELAM (WELCHOL) 625 MG TABLET  Take 1,250 mg by mouth 2 (two) times daily with a meal.   FLUOXETINE (PROZAC) 40 MG CAPSULE    Take 40 mg by mouth daily.    FLUTICASONE-SALMETEROL (ADVAIR DISKUS) 500-50 MCG/DOSE AEPB    Inhale 1 puff into the lungs 2 (two) times daily.    GABAPENTIN (NEURONTIN) 300 MG CAPSULE    Take 1 capsule (300 mg total) by mouth 3 (three) times daily.   INSULIN REGULAR HUMAN CONCENTRATED (HUMULIN R) 500 UNIT/ML SOLN INJECTION    Inject 24-30 Units into the skin 2 (two) times daily with a meal. Pt will inject 24 units every morning and 30 units in the evening ONLY IF blood sugar is above 115.   LAMOTRIGINE (LAMICTAL) 200 MG TABLET    Take 200 mg by mouth at bedtime.   LISINOPRIL (PRINIVIL,ZESTRIL) 10 MG  TABLET    Take 10 mg by mouth at bedtime.    QUETIAPINE (SEROQUEL XR) 400 MG 24 HR TABLET    Take 800 mg by mouth at bedtime.   ROSUVASTATIN (CRESTOR) 40 MG TABLET    Take 40 mg by mouth at bedtime.   TRAMADOL (ULTRAM) 50 MG TABLET    Take 2 tablets (100 mg total) by mouth 2 (two) times daily.   Modified Medications   Modified Medication Previous Medication   CLONAZEPAM (KLONOPIN) 1 MG TABLET clonazePAM (KLONOPIN) 1 MG tablet      Take 0.5 tablets (0.5 mg total) by mouth 2 (two) times daily.    Take 1 mg by mouth 3 (three) times daily.   New Prescriptions   No medications on file   Discontinued Medications   No medications on file  No orders of the defined types were placed in this encounter.    Assessment & Plan   Problems addressed today: General Instructions:  1. Type II or unspecified type diabetes mellitus with neurological manifestations, not stated as uncontrolled(250.60)   2. Chest pain   3. Seizures   4. Pure hyperglyceridemia   5. Tobacco use disorder   6. Elevated BP       Follow up with CARDIOLOGY for a stress test and maybe repeat US of your heart  Cut back your Klonopin to 0.5mg  twice a day       For further discussion of A/P and for follow up issues see problem based charting

## 2013-04-16 NOTE — Patient Instructions (Signed)
It was nice to see you today, thanks for coming in!   Follow up with CARDIOLOGY for a stress test and consider TEE  Cut back your Klonopin to 0.5mg  twice a day   Please plan to return to see me in 1 month SEE DR. HOCHRIN ASAP.    If you need anything prior to your next visit please call the clinic. Please Bring all medications or accurate medication list with you to each appointment; an accurate medication list is essential in providing you the best care possible.

## 2013-04-18 ENCOUNTER — Telehealth: Payer: Self-pay | Admitting: Sports Medicine

## 2013-04-18 DIAGNOSIS — I359 Nonrheumatic aortic valve disorder, unspecified: Secondary | ICD-10-CM | POA: Insufficient documentation

## 2013-04-18 NOTE — Telephone Encounter (Signed)
Pt is wondering about her referral for stress test Please advise

## 2013-04-18 NOTE — Assessment & Plan Note (Signed)
There is an echo density on the echocardiogram that is inconclusive. She does have a murmur today on exam. I called and discussed with DOD at cardiology (Dr. Jens Som) who given the patient's stability felt it was appropriate to have her followup with her primary cardiologist as soon as possible

## 2013-04-18 NOTE — Assessment & Plan Note (Signed)
Continues to have atypical chest pain.  Outpatient stress test indicated. Referred to cardiology.

## 2013-04-18 NOTE — Assessment & Plan Note (Signed)
Followed by Dr Kerr 

## 2013-04-18 NOTE — Assessment & Plan Note (Signed)
I am somewhat concerned that a lot of her malaise and fatigue currently may be due to overmedication.  She has however had what sounds like a recurrent seizure.  She had been seizure free for 3 months. - Decreased Klonopin slightly today > Follow up with Dr. Tomasa Rand to further discuss other medication changes > Consider decreasing Neurontin

## 2013-04-18 NOTE — Assessment & Plan Note (Signed)
Encouraged to consider quitting

## 2013-04-22 ENCOUNTER — Telehealth: Payer: Self-pay | Admitting: Sports Medicine

## 2013-04-22 DIAGNOSIS — R0789 Other chest pain: Secondary | ICD-10-CM

## 2013-04-22 NOTE — Telephone Encounter (Signed)
Will forward to white team.  

## 2013-04-22 NOTE — Telephone Encounter (Signed)
Pt called to check the status of her referral to have a stress test done. JW

## 2013-04-23 DIAGNOSIS — E1129 Type 2 diabetes mellitus with other diabetic kidney complication: Secondary | ICD-10-CM | POA: Diagnosis not present

## 2013-04-23 DIAGNOSIS — E669 Obesity, unspecified: Secondary | ICD-10-CM | POA: Diagnosis not present

## 2013-04-23 DIAGNOSIS — F17200 Nicotine dependence, unspecified, uncomplicated: Secondary | ICD-10-CM | POA: Diagnosis not present

## 2013-04-23 DIAGNOSIS — N189 Chronic kidney disease, unspecified: Secondary | ICD-10-CM | POA: Diagnosis not present

## 2013-04-23 DIAGNOSIS — E1149 Type 2 diabetes mellitus with other diabetic neurological complication: Secondary | ICD-10-CM | POA: Diagnosis not present

## 2013-04-23 DIAGNOSIS — E1142 Type 2 diabetes mellitus with diabetic polyneuropathy: Secondary | ICD-10-CM | POA: Diagnosis not present

## 2013-05-06 NOTE — Telephone Encounter (Signed)
Would like for the appt for Nov 7 or Nove10. Husband is off those 2 days and would available to take her.

## 2013-05-07 NOTE — Telephone Encounter (Signed)
Patient was supposed to call to schedule a followup with Dr. Antoine Poche as she is an established patient at cardiology and they recently saw her in the hospital.  I'm not sure why this is not been able to happen.  I will place a referral although the patient should be able to call and schedule this herself.  She will need to be seen in their clinic prior to the stress test.

## 2013-05-08 NOTE — Telephone Encounter (Signed)
Related message and gave patient Dr Antoine Poche phone number and address,pt voiced understanding. Emran Molzahn, Virgel Bouquet

## 2013-05-15 ENCOUNTER — Encounter: Payer: Self-pay | Admitting: Interventional Cardiology

## 2013-05-20 ENCOUNTER — Encounter: Payer: Self-pay | Admitting: Interventional Cardiology

## 2013-05-20 ENCOUNTER — Ambulatory Visit (INDEPENDENT_AMBULATORY_CARE_PROVIDER_SITE_OTHER): Payer: Managed Care, Other (non HMO) | Admitting: Interventional Cardiology

## 2013-05-20 ENCOUNTER — Encounter (INDEPENDENT_AMBULATORY_CARE_PROVIDER_SITE_OTHER): Payer: Self-pay

## 2013-05-20 VITALS — BP 120/86 | HR 109 | Ht 64.0 in | Wt 227.1 lb

## 2013-05-20 DIAGNOSIS — R0789 Other chest pain: Secondary | ICD-10-CM | POA: Diagnosis not present

## 2013-05-20 NOTE — Patient Instructions (Signed)
Your physician has requested that you have a lexiscan myoview. For further information please visit [email or web address]. Please follow instruction sheet, as given.   

## 2013-05-20 NOTE — Progress Notes (Signed)
Patient ID: [name], female   DOB: 04-30-1971, 42 y.o.   MRN: [id number]     Patient ID: [name] MRN: [id number] DOB/AGE: 12/07/1970 42 y.o.   Referring Physician Dr. Berline Chough   Reason for Consultation chest pain  HPI: 42 y/o who smokes.  SHe was hospitalized for chest pain.  She had  normal LV function by echocardiogram.   There was a calcific density on the aortic vlave.  She has frequent chest tightness.  She treats it like heartburn.  There is relief with Alka Seltzer.  She takes prilosec.  Not related specifcally to exertion.  She does not think she would be able on walk on the treadmill.  She had a pharmologic stress test about 5 years ago.  She has leg weakness.    She has poor dentition and needs all her teeth removed. She thinks this may be contributing to her poor feeling. She has had some dizziness. She denies syncope. She has not had diaphoresis. Her eating is limited due to her teeth. She does not report nausea.  She does say that she is not ready to quit smoking.   Current Outpatient Prescriptions  Medication Sig Dispense Refill  . albuterol (PROVENTIL HFA;VENTOLIN HFA) 108 (90 BASE) MCG/ACT inhaler Inhale 2 puffs into the lungs every 4 (four) hours as needed. For shortness of breath      . aspirin EC 81 MG tablet Take 81 mg by mouth every morning.      . Canagliflozin (INVOKANA) 300 MG TABS Take 300 mg by mouth every morning.      . clonazePAM (KLONOPIN) 1 MG tablet Take 0.5 tablets (0.5 mg total) by mouth 2 (two) times daily.      . colesevelam (WELCHOL) 625 MG tablet Take 1,250 mg by mouth 2 (two) times daily with a meal.      . FLUoxetine (PROZAC) 40 MG capsule Take 40 mg by mouth daily.       . Fluticasone-Salmeterol (ADVAIR DISKUS) 500-50 MCG/DOSE AEPB Inhale 1 puff into the lungs 2 (two) times daily.       Marland Kitchen gabapentin (NEURONTIN) 300 MG capsule Take 1 capsule (300 mg total) by mouth 3 (three) times daily.  90 capsule  5  . insulin regular human CONCENTRATED  (HUMULIN R) 500 UNIT/ML SOLN injection Inject 24-30 Units into the skin 2 (two) times daily with a meal. Pt will inject 24 units every morning and 30 units in the evening ONLY IF blood sugar is above 115.      . lamoTRIgine (LAMICTAL) 200 MG tablet Take 200 mg by mouth at bedtime.      Marland Kitchen lisinopril (PRINIVIL,ZESTRIL) 10 MG tablet Take 10 mg by mouth at bedtime.       Marland Kitchen QUEtiapine (SEROQUEL XR) 400 MG 24 hr tablet Take 800 mg by mouth at bedtime.      . rosuvastatin (CRESTOR) 40 MG tablet Take 40 mg by mouth at bedtime.      . traMADol (ULTRAM) 50 MG tablet Take 2 tablets (100 mg total) by mouth 2 (two) times daily.  120 tablet  3   No current facility-administered medications for this visit.   Past Medical History  Diagnosis Date  . Asthma   . COPD (chronic obstructive pulmonary disease)   . Diabetes mellitus   . Renal disorder   . Hypertension   . Seizures   . Syncope   . Bipolar 1 disorder   . DVT (deep venous thrombosis)  2011- was on coumadin for a year  . Stroke     2010  . GERD (gastroesophageal reflux disease)   . Hyperlipidemia     Family History  Problem Relation Age of Onset  . Venous thrombosis Brother   . Asthma Father   . Diabetes Father   . Coronary artery disease Mother   . Hypertension Mother   . Diabetes Mother   . Asthma Sister     History   Social History  . Marital Status: Married    Spouse Name: N/A    Number of Children: N/A  . Years of Education: N/A   Occupational History  . Not on file.   Social History Main Topics  . Smoking status: Current Every Day Smoker -- 1.00 packs/day for 20 years    Types: Cigarettes  . Smokeless tobacco: Not on file  . Alcohol Use: No  . Drug Use: No  . Sexual Activity: Yes   Other Topics Concern  . Not on file   Social History Narrative   Lives in [place]   Married but lives with Boyfriend - not legally separated   Disabled - for BiPolar, Seizure disorder, diabetes   Formerly worked at [employer]           Past Surgical History  Procedure Laterality Date  . Laparoscopy      Endometriosis      (Not in a hospital admission)  Review of systems complete and found to be negative unless listed above .  No nausea, vomiting.  No fever chills, No focal weakness,  No palpitations.  Physical Exam: Filed Vitals:   05/20/13 1440  BP: 120/86  Pulse: 109    Weight: 227 lb 1.9 oz (103.021 kg)  Physical exam:  [place]/AT EOMI No JVD, No carotid bruit RRR S1S2  No wheezing Soft. NT, nondistended No edema. No focal motor or sensory deficits Normal affect  Labs:   Lab Results  Component Value Date   WBC 10.0 04/02/2013   HGB 13.4 04/02/2013   HCT 38.8 04/02/2013   MCV 86.8 04/02/2013   PLT 250 04/02/2013   No results found for this basename: NA, K, CL, CO2, BUN, CREATININE, CALCIUM, LABALBU, PROT, BILITOT, ALKPHOS, ALT, AST, GLUCOSE,  in the last 168 hours Lab Results  Component Value Date   CKTOTAL 90 10/08/2011   CKMB 1.7 10/08/2011   TROPONINI <0.30 04/02/2013    Lab Results  Component Value Date   CHOL 204* 02/04/2013   CHOL  Value: 172        ATP III CLASSIFICATION:  <200     mg/dL   Desirable  098-119  mg/dL   Borderline High  >=147    mg/dL   High        03/08/5620   CHOL  Value: 217        ATP III CLASSIFICATION:  <200     mg/dL   Desirable  308-657  mg/dL   Borderline High  >=846    mg/dL   High* 9/62/9528   Lab Results  Component Value Date   HDL 34* 02/04/2013   HDL 24* 07/26/2008   HDL 32* 09/02/2007   Lab Results  Component Value Date   LDLCALC NOT CALC 02/04/2013   LDLCALC  Value: 78        Total Cholesterol/HDL:CHD Risk Coronary Heart Disease Risk Table                     Men  Women  1/2 Average Risk   3.4   3.3  Average Risk       5.0   4.4  2 X Average Risk   9.6   7.1  3 X Average Risk  23.4   11.0        Use the calculated Patient Ratio above and the CHD Risk Table to determine the patient's CHD Risk.        ATP III CLASSIFICATION (LDL):  <100     mg/dL   Optimal  161-096   mg/dL   Near or Above                    Optimal  130-159  mg/dL   Borderline  045-409  mg/dL   High  >811     mg/dL   Very High 03/24/7828   LDLCALC  Value: UNABLE TO CALCULATE IF TRIGLYCERIDE OVER 400 mg/dL        Total Cholesterol/HDL:CHD Risk Coronary Heart Disease Risk Table                     Men   Women  1/2 Average Risk   3.4   3.3 09/02/2007   Lab Results  Component Value Date   TRIG 658* 02/04/2013   TRIG 348* 07/26/2008   TRIG 462* 09/02/2007   Lab Results  Component Value Date   CHOLHDL 6.0 02/04/2013   CHOLHDL 7.2 07/26/2008   CHOLHDL 6.8 09/02/2007   Lab Results  Component Value Date   LDLDIRECT 145* 04/30/2009       EKG: NSR, prolonged QT, PRWP, NSST  ASSESSMENT AND PLAN:  Atypical chest pain: Several RF for CAD including smoking.  She doesn't think she can walk on the treadmill. We'll plan for pharmacologic stress test to evaluate for ischemia. She is agreeable to this.  Smoking:  We spoke about the importance of stopping smoking.  She also has several abscessed teeth that have come out. She may feel better after the source of infection is removed.  Hypertriglyceridemia/hyperlipidemia: Her triglycerides have been over 600. Continue Crestor. She may benefit from fish oil.  Would start at 2 g by mouth twice a day.  Signed:   Fredric Mare, MD, [facility] 05/20/2013, 3:04 PM

## 2013-05-27 ENCOUNTER — Encounter: Payer: Self-pay | Admitting: Internal Medicine

## 2013-05-29 ENCOUNTER — Encounter: Payer: Self-pay | Admitting: Cardiovascular Disease

## 2013-05-29 ENCOUNTER — Ambulatory Visit (HOSPITAL_COMMUNITY): Payer: Managed Care, Other (non HMO) | Attending: Cardiovascular Disease | Admitting: Radiology

## 2013-05-29 VITALS — BP 107/57 | Ht 64.0 in | Wt 224.0 lb

## 2013-05-29 DIAGNOSIS — R079 Chest pain, unspecified: Secondary | ICD-10-CM | POA: Diagnosis not present

## 2013-05-29 DIAGNOSIS — Z794 Long term (current) use of insulin: Secondary | ICD-10-CM | POA: Diagnosis not present

## 2013-05-29 DIAGNOSIS — F17200 Nicotine dependence, unspecified, uncomplicated: Secondary | ICD-10-CM | POA: Diagnosis not present

## 2013-05-29 DIAGNOSIS — E785 Hyperlipidemia, unspecified: Secondary | ICD-10-CM | POA: Diagnosis not present

## 2013-05-29 DIAGNOSIS — I779 Disorder of arteries and arterioles, unspecified: Secondary | ICD-10-CM | POA: Diagnosis not present

## 2013-05-29 DIAGNOSIS — J449 Chronic obstructive pulmonary disease, unspecified: Secondary | ICD-10-CM | POA: Diagnosis not present

## 2013-05-29 DIAGNOSIS — I10 Essential (primary) hypertension: Secondary | ICD-10-CM | POA: Diagnosis not present

## 2013-05-29 DIAGNOSIS — Z8249 Family history of ischemic heart disease and other diseases of the circulatory system: Secondary | ICD-10-CM | POA: Diagnosis not present

## 2013-05-29 DIAGNOSIS — E119 Type 2 diabetes mellitus without complications: Secondary | ICD-10-CM | POA: Diagnosis not present

## 2013-05-29 DIAGNOSIS — R42 Dizziness and giddiness: Secondary | ICD-10-CM | POA: Insufficient documentation

## 2013-05-29 DIAGNOSIS — R569 Unspecified convulsions: Secondary | ICD-10-CM | POA: Diagnosis not present

## 2013-05-29 DIAGNOSIS — R0789 Other chest pain: Secondary | ICD-10-CM

## 2013-05-29 DIAGNOSIS — J4489 Other specified chronic obstructive pulmonary disease: Secondary | ICD-10-CM | POA: Insufficient documentation

## 2013-05-29 MED ORDER — REGADENOSON 0.4 MG/5ML IV SOLN
0.4000 mg | Freq: Once | INTRAVENOUS | Status: AC
  Administered 2013-05-29: 0.4 mg via INTRAVENOUS

## 2013-05-29 MED ORDER — TECHNETIUM TC 99M SESTAMIBI GENERIC - CARDIOLITE
30.0000 | Freq: Once | INTRAVENOUS | Status: AC | PRN
  Administered 2013-05-29: 30 via INTRAVENOUS

## 2013-05-29 NOTE — Progress Notes (Signed)
[facility] SITE 3 NUCLEAR MED [address] [place], [place] [address] [phone]    Cardiology Nuclear Med Study  [name] is a 42 y.o. female     MRN : [id number]     DOB: [birth year]  Procedure Date: 05/29/2013  Nuclear Med Background Indication for Stress Test:  Evaluation for Ischemia and 9/14 ED CP (-) enzymes History:  Asthma, COPD and MPI:No report, ECHO: EF: 55-65%, Seizures Cardiac Risk Factors: Carotid Disease, Family History - CAD, Hypertension, IDDM Type 1, Lipids and Smoker  Symptoms:  Chest Pain and Dizziness   Nuclear Pre-Procedure Caffeine/Decaff Intake:  None NPO After: 8:00pm   Lungs:  clear O2 Sat: 96% on room air. IV 0.9% NS with Angio Cath:  22g  IV Site: R Hand  IV Started by:  Bonnita Levan, RN  Chest Size (in):  42 Cup Size: D  Height: 5\' 4"  (1.626 m)  Weight:  224 lb (101.606 kg)  BMI:  Body mass index is 38.43 kg/(m^2). Tech Comments:  CBG 239 mg/dl took seroquel last night    Nuclear Med Study 1 or 2 day study: 2 day  Stress Test Type:  Eugenie Birks  Reading MD: Lance Muss, MD  Order Authorizing Provider:  J.Varanasi MD  Resting Radionuclide: Technetium 76m Sestamibi  Resting Radionuclide Dose: 33.0 mCi on 06-03-13  Stress Radionuclide:  Technetium 46m Sestamibi  Stress Radionuclide Dose: 33.0 mCi on 05-29-13          Stress Protocol Rest HR: 99 Stress HR: 122  Rest BP: 107/57 Stress BP: 114/59  Exercise Time (min): n/a METS: n/a   Predicted Max HR: 176 bpm % Max HR: 69.32 bpm Rate Pressure Product: 21308   Dose of Adenosine (mg):  n/a Dose of Lexiscan: 0.4 mg  Dose of Atropine (mg): n/a Dose of Dobutamine: n/a mcg/kg/min (at max HR)  Stress Test Technologist: Milana Na, EMT-P  Nuclear Technologist:  Domenic Polite, CNMT     Rest Procedure:  Myocardial perfusion imaging was performed at rest 45 minutes following the intravenous administration of Technetium 77m Sestamibi. Rest ECG: NSR, Poor R wave  progression  Stress Procedure:  The patient received IV Lexiscan 0.4 mg over 15-seconds.  Technetium 90m Sestamibi injected at 30-seconds. This patient was sob, had a head rush, and a headache with the Lexiscan injection. Quantitative spect images were obtained after a 45 minute delay. Stress ECG: No significant change from baseline ECG  QPS Raw Data Images:  Mild breast attenuation.  Normal left ventricular size. Stress Images:  There is decreased uptake in the apex. Rest Images:  There is decreased uptake in the apex. Subtraction (SDS):  No evidence of ischemia. Transient Ischemic Dilatation (Normal <1.22):  0.97 Lung/Heart Ratio (Normal <0.45):  0.56  Quantitative Gated Spect Images QGS EDV:  90 ml QGS ESV:  32 ml  Impression Exercise Capacity:  Lexiscan with no exercise. BP Response:  Normal blood pressure response. Clinical Symptoms:  There is dyspnea. ECG Impression:  No significant ST segment change suggestive of ischemia. Comparison with Prior Nuclear Study: No images to compare  Overall Impression:  Low risk stress nuclear study . No clear evidence of ischemia..  LV Ejection Fraction: 64%.  LV Wall Motion:  NL LV Function; NL Wall Motion  Corky Crafts., MD, [facility]

## 2013-05-29 NOTE — Progress Notes (Deleted)
[facility] SITE 3 NUCLEAR MED [address] [id number] [place] [address] [phone]  Cardiology Nuclear Med Study  Jenni Thew is a 42 y.o. female     MRN : [id number]     DOB: 1970/11/15  Procedure Date: 05/29/2013  Nuclear Med Background Indication for Stress Test:  Evaluation for Ischemia and 9/14 ED CP (-) Enzymes History:  Asthma, COPD and MPI: No Report, ECHO: EF: 55-65%, H/O Seizures Cardiac Risk Factors: Carotid Disease, Family History - CAD, History of Smoking, IDDM Type 1, Lipids and Smoker  Symptoms:  Chest Pain and Dizziness   Nuclear Pre-Procedure Caffeine/Decaff Intake:  None NPO After: 8:00pm   IV Site: R Hand  IV 0.9% NS with Angio Cath:  22g  Chest Size (in):  42 IV Started by: {CHL HOD [facility] IV Started VZ:5638756}  Height: 5\' 4"  (1.626 m)  Cup Size: D  BMI:  Body mass index is 38.43 kg/(m^2). Weight:  224 lb (101.606 kg)   Tech Comments:  *    Nuclear Med Study 1 or 2 day study: {CHL 1 OR 2 DAY STUDY:21021019}  Stress Test Type:  {CHL STRESS TEST TYPE:21021018}  Order Authorizing Provider:  ***   Resting Radionuclide: {CHL RESTING RADIONUCLIDE:21021021}  Resting Radionuclide Dose: *** mCi   Stress Radionuclide:  {CHL STRESS RADIONUCLIDE:21021022}  Stress Radionuclide Dose: *** mCi           Stress Protocol Rest HR: *** Stress HR: ***  Rest BP: *** Stress BP: ***  Exercise Time (min): {NA AND WILDCARD:21589} METS: {NA AND WILDCARD:21589}   Predicted Max HR: 176 bpm % Max HR: 69.32 bpm Rate Pressure Product: 43329  Dose of Adenosine (mg):  {NA AND JJOACZYS:06301} Dose of Lexiscan: {CHL CARD WILDCARD AND 0.4:21590} mg  Dose of Atropine (mg): {NA AND SWFUXNAT:55732} Dose of Dobutamine: {NA AND WILDCARD:21589} mcg/kg/min (at max HR)  Stress Test Technologist: {CHL HOD Stress Test Technologist:3049501} Nuclear Technologist: {CHL HOD [facility] Nuclear Technologist:3049502}   Rest Procedure:  {CHL REST  PROCEDURE KGURKYH:0623762} Stress Procedure:  {CHL STRESS PROCEDURE NUCLEAR:3049519}  Transient Ischemic Dilatation (Normal <1.22):  *** Lung/Heart Ratio (Normal <0.45):  *** QGS EDV:  *** ml QGS ESV:  *** ml LV Ejection Fraction: {CHL CARD STUDY NOT GBTDV:7616073}

## 2013-06-03 ENCOUNTER — Ambulatory Visit (HOSPITAL_COMMUNITY): Payer: Managed Care, Other (non HMO) | Attending: Interventional Cardiology | Admitting: Radiology

## 2013-06-03 DIAGNOSIS — R0989 Other specified symptoms and signs involving the circulatory and respiratory systems: Secondary | ICD-10-CM

## 2013-06-03 MED ORDER — TECHNETIUM TC 99M SESTAMIBI GENERIC - CARDIOLITE
30.0000 | Freq: Once | INTRAVENOUS | Status: AC | PRN
  Administered 2013-06-03: 30 via INTRAVENOUS

## 2013-06-05 ENCOUNTER — Telehealth: Payer: Self-pay | Admitting: Interventional Cardiology

## 2013-06-05 DIAGNOSIS — F3131 Bipolar disorder, current episode depressed, mild: Secondary | ICD-10-CM | POA: Diagnosis not present

## 2013-06-05 NOTE — Telephone Encounter (Signed)
Returned pts call with myoview results.  

## 2013-06-05 NOTE — Telephone Encounter (Signed)
New message ° ° ° ° ° °Returning a nurses call to get test results °

## 2013-08-01 ENCOUNTER — Telehealth: Payer: Self-pay | Admitting: *Deleted

## 2013-08-01 NOTE — Telephone Encounter (Signed)
Prior authorization form for Crestor placed in MD box for completion.

## 2013-08-02 ENCOUNTER — Other Ambulatory Visit: Payer: Self-pay | Admitting: Sports Medicine

## 2013-08-02 MED ORDER — ATORVASTATIN CALCIUM 80 MG PO TABS: 80.0000 mg | ORAL_TABLET | Freq: Every evening | ORAL | Status: DC

## 2013-08-02 NOTE — Telephone Encounter (Signed)
Changed pt to Lipitor which is only high potency statin on [employer] 2015 formulary.

## 2013-08-20 DIAGNOSIS — E785 Hyperlipidemia, unspecified: Secondary | ICD-10-CM | POA: Diagnosis not present

## 2013-08-20 DIAGNOSIS — E1142 Type 2 diabetes mellitus with diabetic polyneuropathy: Secondary | ICD-10-CM | POA: Diagnosis not present

## 2013-08-20 DIAGNOSIS — E1165 Type 2 diabetes mellitus with hyperglycemia: Secondary | ICD-10-CM | POA: Diagnosis not present

## 2013-08-20 DIAGNOSIS — E1129 Type 2 diabetes mellitus with other diabetic kidney complication: Secondary | ICD-10-CM | POA: Diagnosis not present

## 2013-08-20 DIAGNOSIS — Z23 Encounter for immunization: Secondary | ICD-10-CM | POA: Diagnosis not present

## 2013-08-20 DIAGNOSIS — N189 Chronic kidney disease, unspecified: Secondary | ICD-10-CM | POA: Diagnosis not present

## 2013-08-20 DIAGNOSIS — E669 Obesity, unspecified: Secondary | ICD-10-CM | POA: Diagnosis not present

## 2013-08-20 DIAGNOSIS — E1149 Type 2 diabetes mellitus with other diabetic neurological complication: Secondary | ICD-10-CM | POA: Diagnosis not present

## 2013-08-20 DIAGNOSIS — F17200 Nicotine dependence, unspecified, uncomplicated: Secondary | ICD-10-CM | POA: Diagnosis not present

## 2013-09-10 IMAGING — CR DG ANKLE COMPLETE 3+V*L*
3 series · 3 of 3 positions shown · non-contrast
Comparison: None.

CLINICAL DATA: Pain and swelling of the left foot and ankle for 1
week, no known injury

LEFT ANKLE COMPLETE - 3+ VIEW

[t ankle joint ap left]
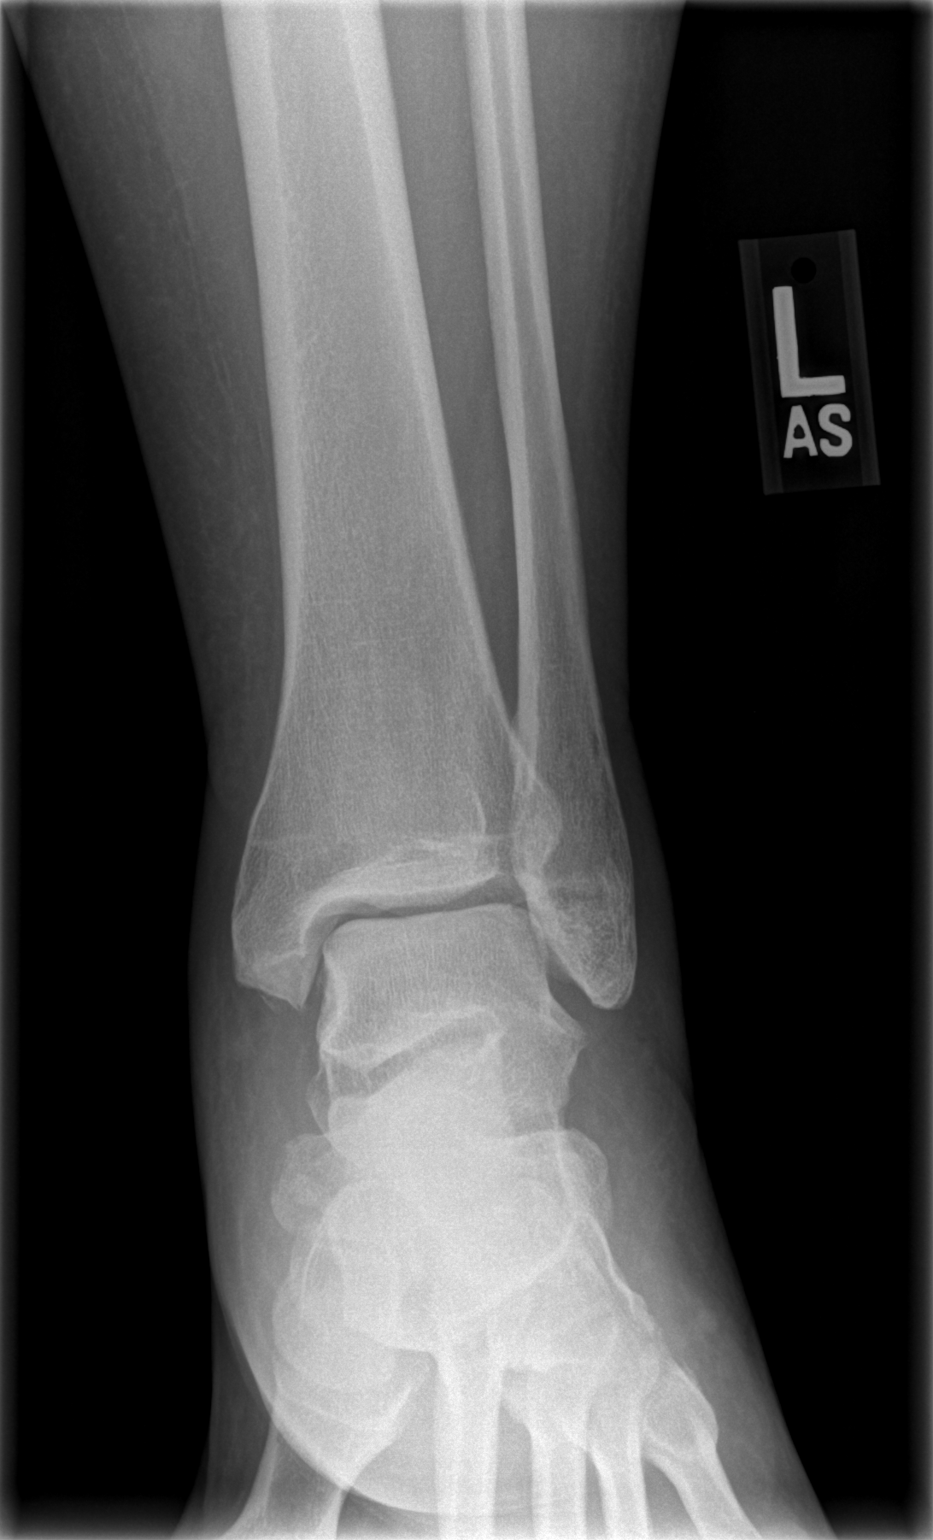

[t ankle joint oblique left]
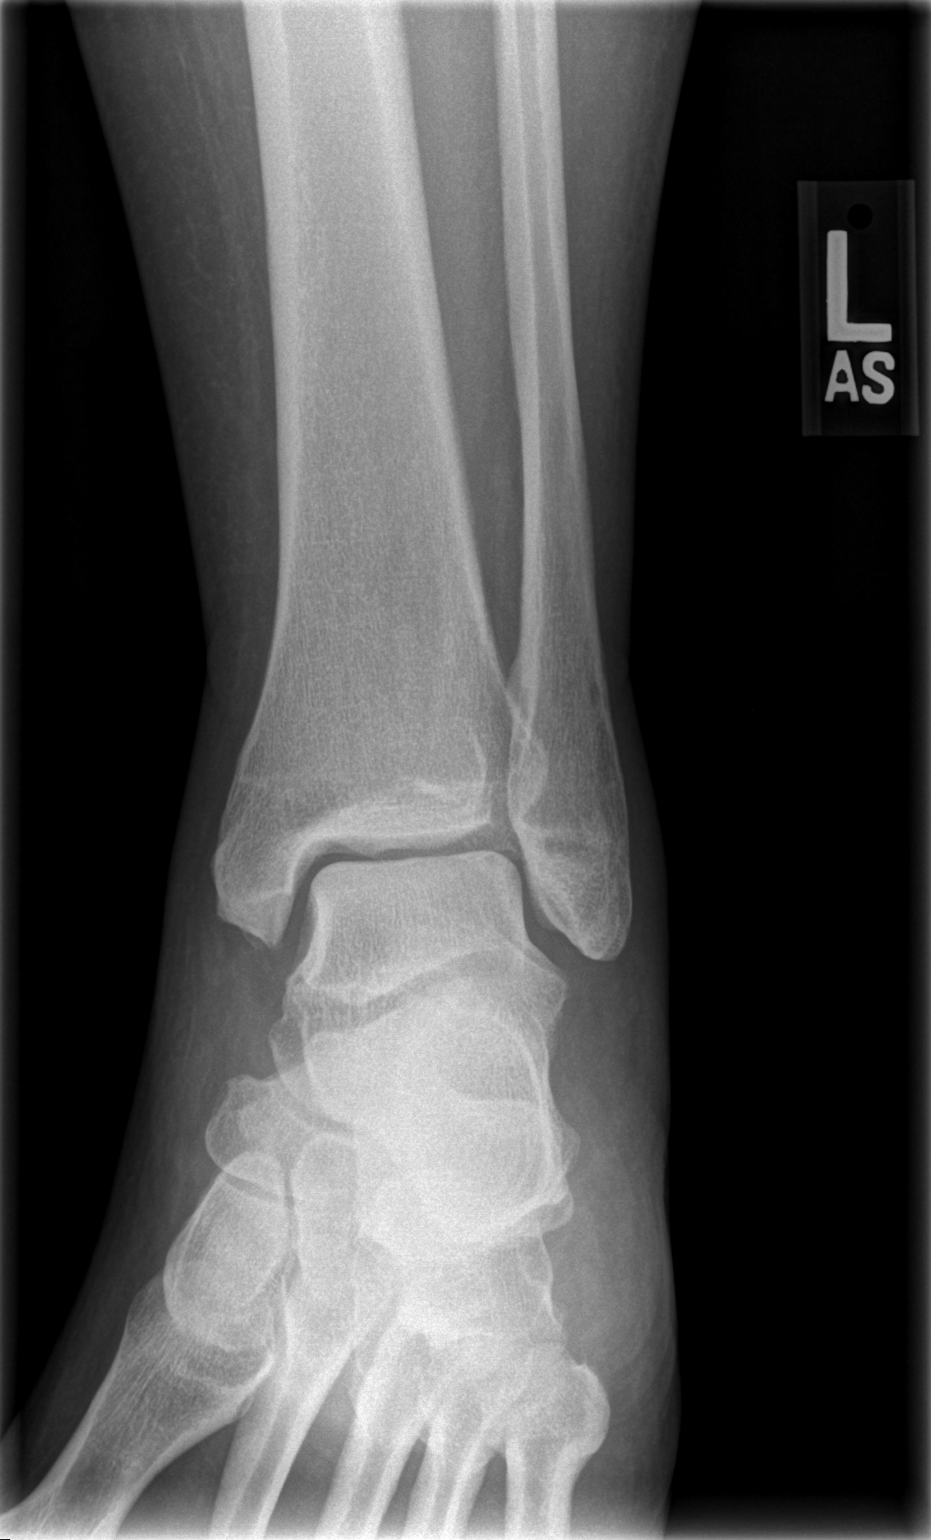

[t ankle joint lat left]
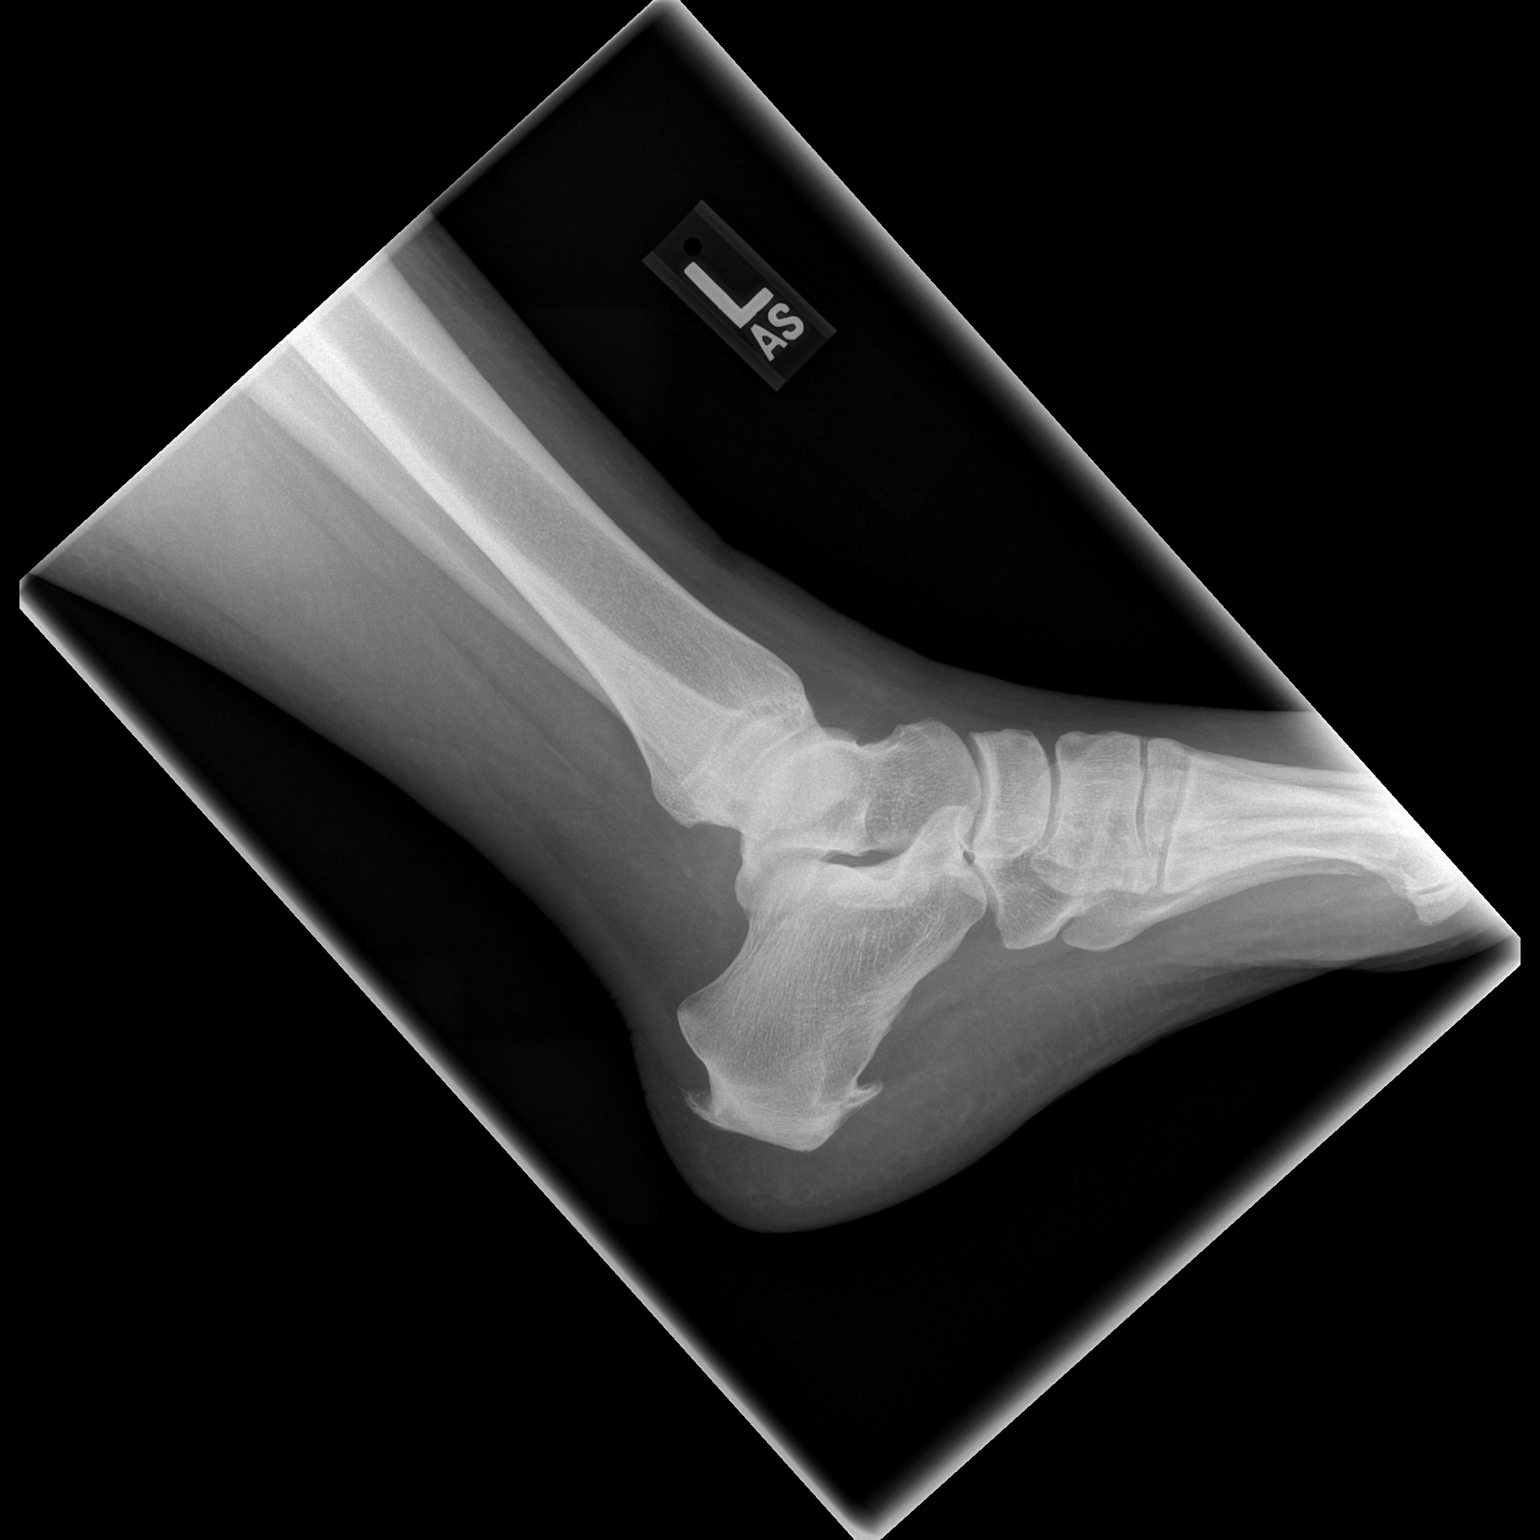

[3 of 3 positions shown; findings below may reference images not displayed]

FINDINGS: There is a small avulsion fracture fragment from the tip
of the medial malleolus.  The ankle joint appears normal.  The
fibula is intact.  There are degenerative spurs emanating from the
calcaneus both at the insertion of the Achilles tendon and along
the plantar aspect.
IMPRESSION: Small avulsion fracture fragment from the tip of the medial
malleolus.

## 2013-09-10 IMAGING — CR DG FOOT COMPLETE 3+V*L*
3 series · 3 of 3 positions shown · non-contrast
Comparison: Left foot films of 02/26/2005

CLINICAL DATA: Pain and swelling of the left foot and ankle for 1
week, no acute injury

LEFT FOOT - COMPLETE 3+ VIEW

[t foot ap left]
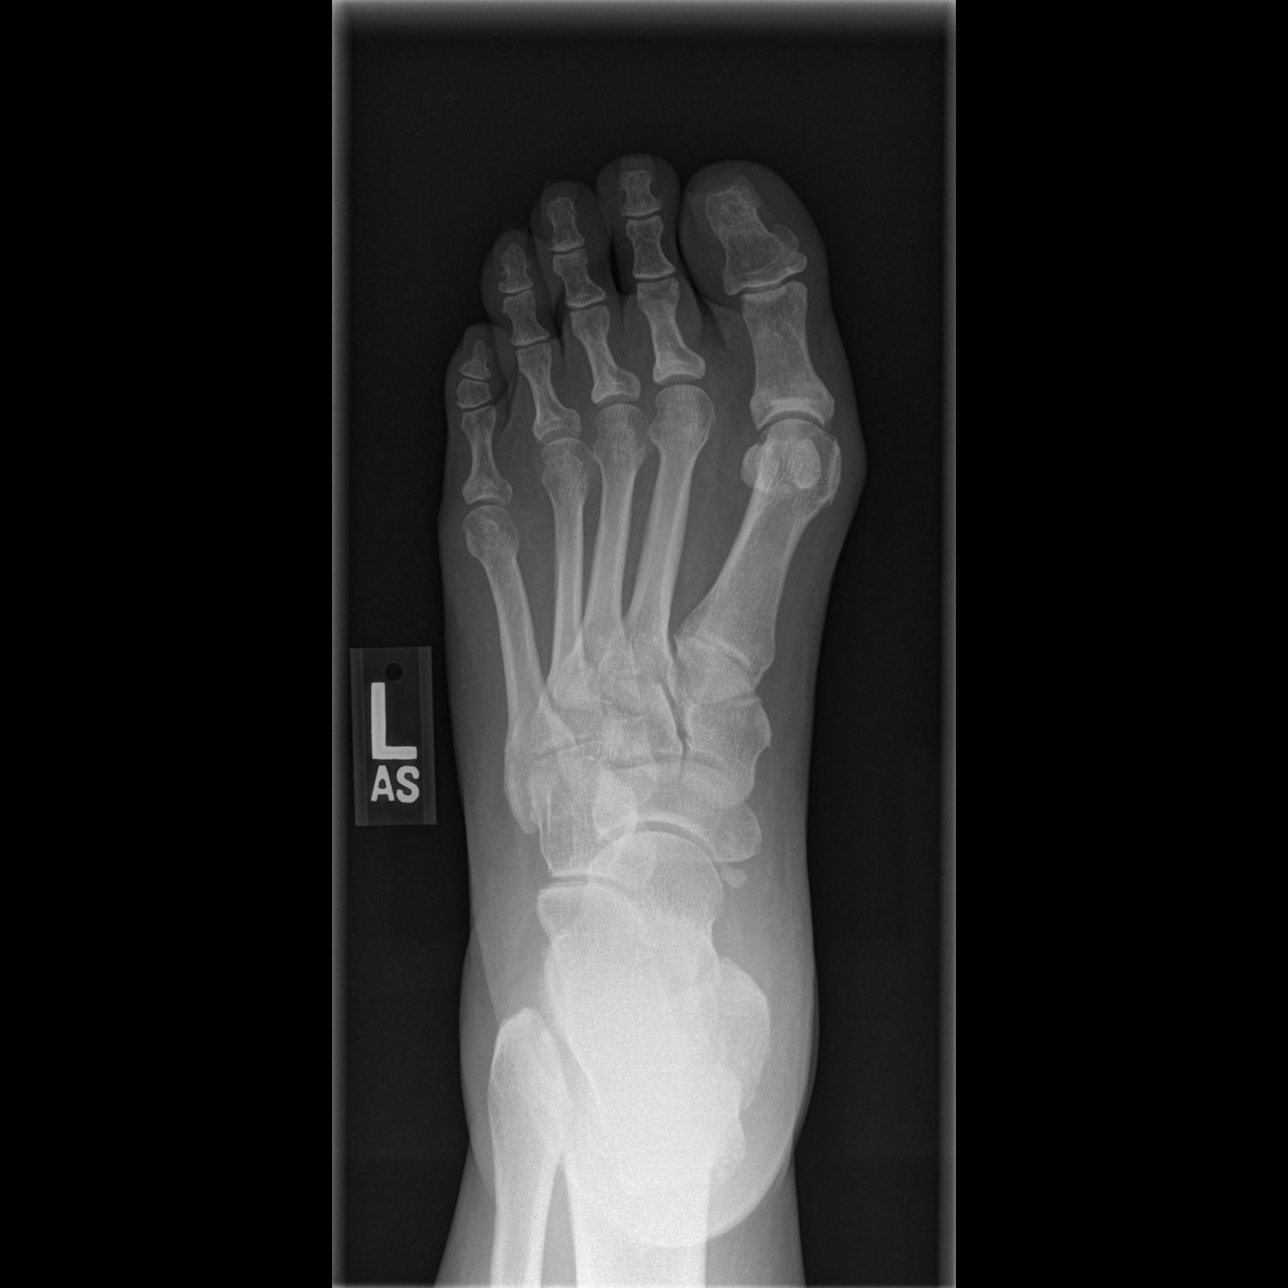

[t foot oblique left]
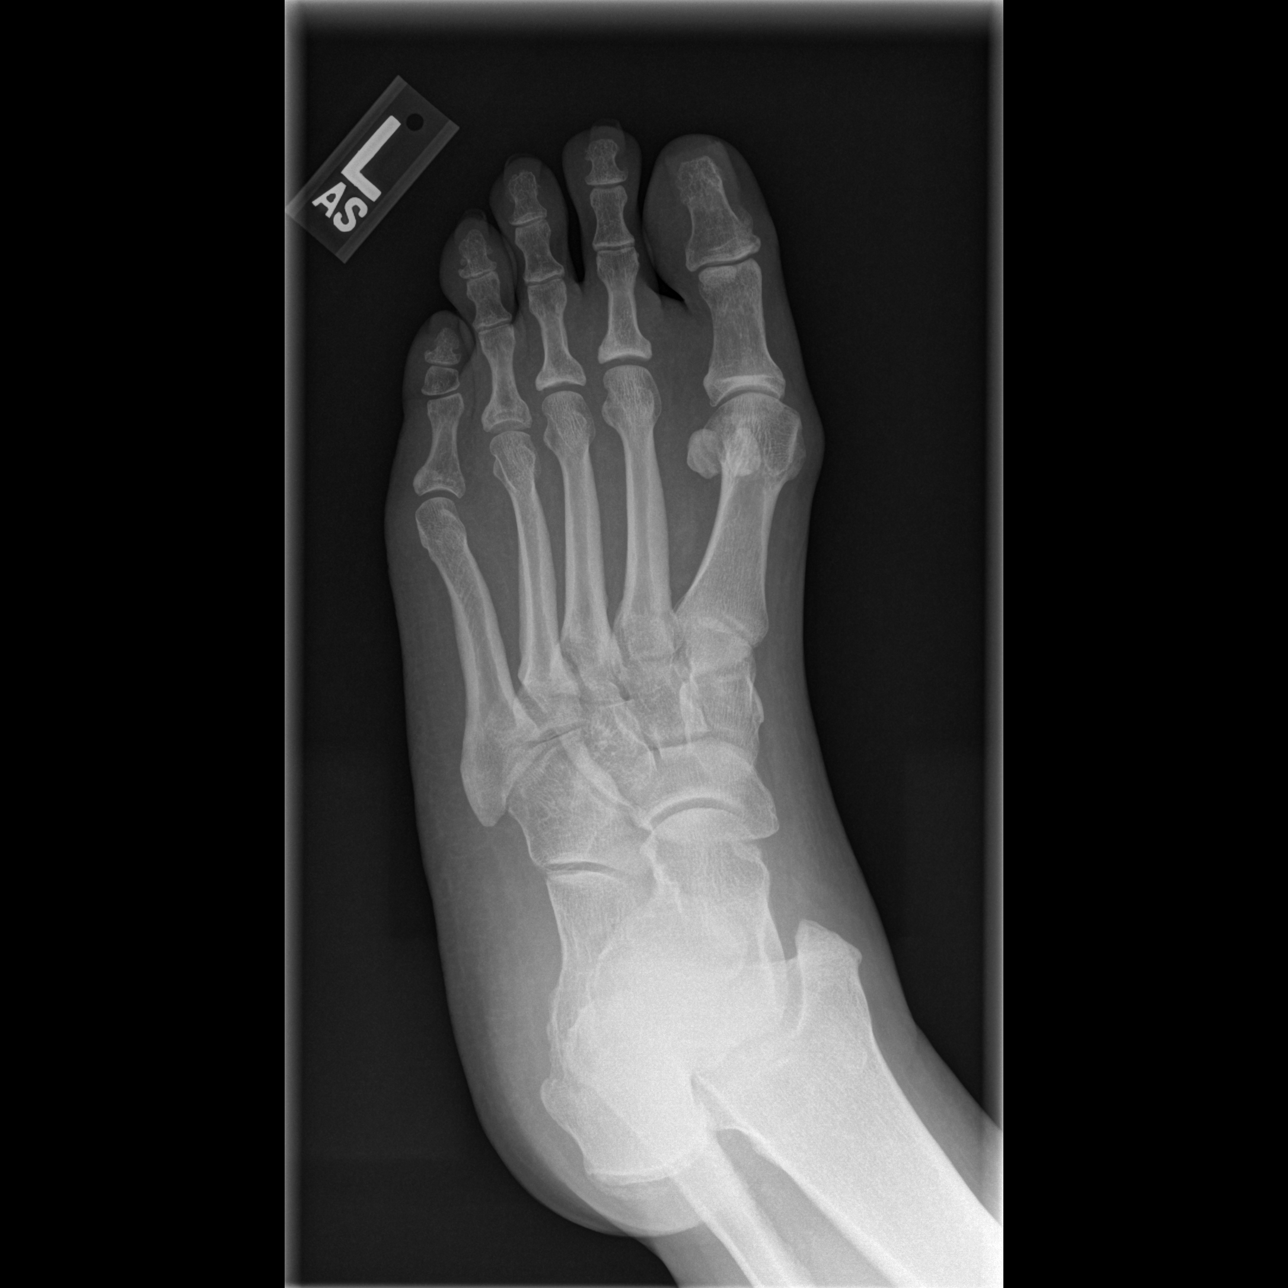

[t foot lat left]
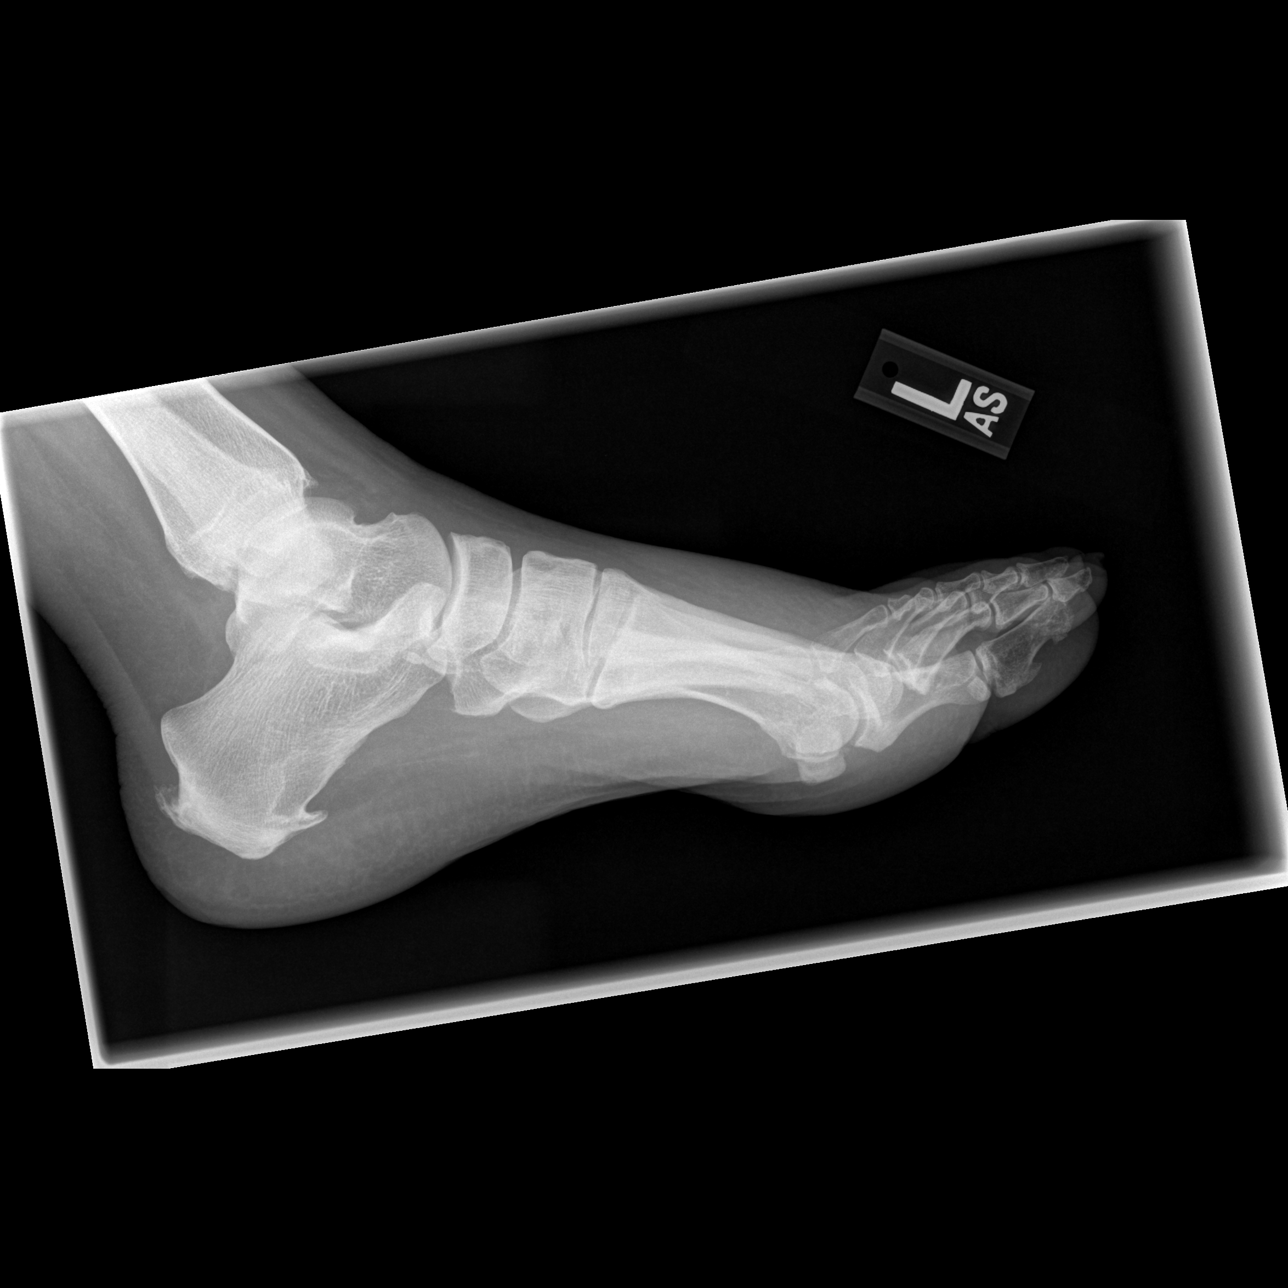

[3 of 3 positions shown; findings below may reference images not displayed]

FINDINGS: Tarsal - metatarsal alignment is stable.  No acute bony
abnormality is seen.  Mild degenerative change is present in the
left first MTP joint.  In addition, there are calcaneal
degenerative spurs noted.
IMPRESSION: No acute abnormality.  Calcaneal degenerative spurs with mild
degenerative change of the left first MTP joint as well.

## 2013-09-25 ENCOUNTER — Other Ambulatory Visit: Payer: Self-pay | Admitting: *Deleted

## 2013-09-30 ENCOUNTER — Other Ambulatory Visit: Payer: Self-pay | Admitting: *Deleted

## 2013-10-01 DIAGNOSIS — M25579 Pain in unspecified ankle and joints of unspecified foot: Secondary | ICD-10-CM | POA: Diagnosis not present

## 2013-10-04 ENCOUNTER — Ambulatory Visit (INDEPENDENT_AMBULATORY_CARE_PROVIDER_SITE_OTHER): Payer: BC Managed Care – PPO | Admitting: Sports Medicine

## 2013-10-04 ENCOUNTER — Telehealth: Payer: Self-pay | Admitting: Sports Medicine

## 2013-10-04 VITALS — BP 133/82 | HR 109 | Temp 98.0°F | Ht 65.0 in | Wt 237.3 lb

## 2013-10-04 DIAGNOSIS — B3731 Acute candidiasis of vulva and vagina: Secondary | ICD-10-CM

## 2013-10-04 DIAGNOSIS — B373 Candidiasis of vulva and vagina: Secondary | ICD-10-CM | POA: Diagnosis not present

## 2013-10-04 DIAGNOSIS — F411 Generalized anxiety disorder: Secondary | ICD-10-CM

## 2013-10-04 DIAGNOSIS — M7989 Other specified soft tissue disorders: Secondary | ICD-10-CM

## 2013-10-04 DIAGNOSIS — F17200 Nicotine dependence, unspecified, uncomplicated: Secondary | ICD-10-CM

## 2013-10-04 DIAGNOSIS — E1149 Type 2 diabetes mellitus with other diabetic neurological complication: Secondary | ICD-10-CM

## 2013-10-04 LAB — POCT WET PREP (WET MOUNT): Clue Cells Wet Prep Whiff POC: NEGATIVE

## 2013-10-04 MED ORDER — PREGABALIN 150 MG PO CAPS: 150.0000 mg | ORAL_CAPSULE | Freq: Two times a day (BID) | ORAL | Status: DC

## 2013-10-04 MED ORDER — FLUCONAZOLE 150 MG PO TABS: 150.0000 mg | ORAL_TABLET | Freq: Once | ORAL | Status: DC

## 2013-10-04 MED ORDER — TRAMADOL HCL 50 MG PO TABS: 100.0000 mg | ORAL_TABLET | Freq: Two times a day (BID) | ORAL | Status: DC | PRN

## 2013-10-04 NOTE — Progress Notes (Signed)
[name] - 43 y.o. female MRN [id number]  Date of birth: 1971/03/10  SUBJECTIVE:     CC: Medication Refill See problem based charting for additional subjective (including HPI, Interval History & ROS)   HISTORY: [name]'s major active medical problems include: # CV Disease: hx of TIA, Sinus Tachycardia, HTN, HLD, DM, tobacco abuse, OSA, poor dentention - poorly controlled chronic diseases - Hospitalized in October 2014 for fatigue, areflexia, generalized weakness and chest pain  - Carotid ultrasound equals 1-39% carotid stenosis  - Echo = EF 55-65%, echodensity on aortic valve, TEE recommended # Uncontrolled DM: with peripheral neuropathy  Managed by Endocrinology - [name]  Most recent A1c (outside lab) 01/23/13 = 7.7 # Neuropsych: Seizure Disorder, Bipolar Disorder, Anxiety  Followed by Dr. Candis Schatz # RESP: COPD, Asthma, Sleep Apnea,  Other Pertinent Med/Surg/Hosp History: # History of laparoscopy for endometriosis   Follow up Issues:  Smoking        Recent Labs  02/04/13 1003 04/01/13 1609  HGBA1C  --  8.9*  TRIG 658*  --   CHOL 204*  --   HDL 34*  --   LDLCALC NOT CALC  --   TSH  --  0.783  } Wt Readings from Last 3 Encounters:  10/04/13 237 lb 4.8 oz (107.639 kg)  05/29/13 224 lb (101.606 kg)  05/20/13 227 lb 1.9 oz (103.021 kg)   BP Readings from Last 3 Encounters:  10/04/13 133/82  05/29/13 107/57  05/20/13 120/86    History  Smoking status  . Current Every Day Smoker -- 0.50 packs/day for 20 years  . Types: Cigarettes  Smokeless tobacco  . Not on file   No health maintenance topics applied.  Otherwise past Medical, Surgical, Social, and Family History Reviewed per EMR Medications and Allergies reviewed and updated per below.  VITALS: BP 133/82  Pulse 109  Temp(Src) 98 F (36.7 C) (Oral)  Ht 5\' 5"  (1.651 m)  Wt 237 lb 4.8 oz (107.639 kg)  BMI 39.49 kg/m2  LMP 11/11/2011  PHYSICAL EXAM: GENERAL:  adult Caucasian female. In no  discomfort; no respiratory distress  PSYCH: alert and appropriate, good insight   HNEENT:  no JVD   CARDIO: RRR, S1/S2 heard, no murmur  LUNGS:  end expiratory wheezes diffusely.  No crackles, good air movement   ABDOMEN:  protuberant, soft, nontender   EXTREM:  Warm, well perfused.  Moves all 4 extremities spontaneously; no lateralization.  No noted foot lesions.  Sensation is diminished to monofilament testing up to the bilateral ankles, intact above and he bilateral lower extremites.   pedal pulses 1+/4  with capillary refill less than 3 seconds bilaterally..  trace pretibial edema.  GU:   SKIN:     MEDICATIONS, LABS & OTHER ORDERS: Previous Medications   ALBUTEROL (PROVENTIL HFA;VENTOLIN HFA) 108 (90 BASE) MCG/ACT INHALER    Inhale 2 puffs into the lungs every 4 (four) hours as needed. For shortness of breath   ASPIRIN EC 81 MG TABLET    Take 81 mg by mouth every morning.   ATORVASTATIN (LIPITOR) 80 MG TABLET    Take 1 tablet (80 mg total) by mouth every evening. Replacement for Crestor   CANAGLIFLOZIN (INVOKANA) 300 MG TABS    Take 300 mg by mouth every morning.   CLONAZEPAM (KLONOPIN) 1 MG TABLET    Take 0.5 tablets (0.5 mg total) by mouth 2 (two) times daily.   COLESEVELAM (WELCHOL) 625 MG TABLET    Take 1,250 mg by mouth 2 (  two) times daily with a meal.   FLUOXETINE (PROZAC) 40 MG CAPSULE    Take 40 mg by mouth daily.    FLUTICASONE-SALMETEROL (ADVAIR DISKUS) 500-50 MCG/DOSE AEPB    Inhale 1 puff into the lungs 2 (two) times daily.    INSULIN REGULAR HUMAN CONCENTRATED (HUMULIN R) 500 UNIT/ML SOLN INJECTION    Inject 24-30 Units into the skin 2 (two) times daily with a meal. Pt will inject 24 units every morning and 30 units in the evening ONLY IF blood sugar is above 115.   LAMOTRIGINE (LAMICTAL) 200 MG TABLET    Take 200 mg by mouth at bedtime.   LISINOPRIL (PRINIVIL,ZESTRIL) 10 MG TABLET    Take 10 mg by mouth at bedtime.    QUETIAPINE (SEROQUEL XR) 400 MG 24 HR TABLET    Take 800  mg by mouth at bedtime.   Modified Medications   Modified Medication Previous Medication   TRAMADOL (ULTRAM) 50 MG TABLET traMADol (ULTRAM) 50 MG tablet      Take 2 tablets (100 mg total) by mouth every 12 (twelve) hours as needed.    Take 2 tablets (100 mg total) by mouth 2 (two) times daily.   New Prescriptions   FLUCONAZOLE (DIFLUCAN) 150 MG TABLET    Take 1 tablet (150 mg total) by mouth once. Then repeat again in 3 days   PREGABALIN (LYRICA) 150 MG CAPSULE    Take 1 capsule (150 mg total) by mouth 2 (two) times daily.   Discontinued Medications   GABAPENTIN (NEURONTIN) 300 MG CAPSULE    Take 1 capsule (300 mg total) by mouth 3 (three) times daily.   Orders Placed This Encounter  Procedures  . POCT Wet Prep [facility])   ASSESSMENT & PLAN: See problem based charting & AVS for pt instructions.

## 2013-10-04 NOTE — Telephone Encounter (Signed)
Spoke with [employer] has already been resolved. Received fax from our office for Lyrica and diflucan.  Called patient and left message that meds have been faxed to her pharmacy.   Burna Forts, BSN, RN-BC

## 2013-10-04 NOTE — Patient Instructions (Signed)
We are checking for a recurrent yeast infection today.  If this is positive I will call in another prescription.  I would like to see back if this does not improve and we need to consider stopping your Invokana as this is a known and common side effect of that medication.  Please stop her Neurontin as you told me it does not seem to be helping it may be making things worse.  We will try you  on Lyrica.  Only use your tramadol as needed and do not take more than 2 pills per day.  This can increase seizures.

## 2013-10-04 NOTE — Telephone Encounter (Signed)
Patient states that she did not receive Rx for Lyrica, only for Tramadol.

## 2013-10-15 DIAGNOSIS — B3731 Acute candidiasis of vulva and vagina: Secondary | ICD-10-CM | POA: Insufficient documentation

## 2013-10-15 DIAGNOSIS — B373 Candidiasis of vulva and vagina: Secondary | ICD-10-CM | POA: Insufficient documentation

## 2013-10-15 NOTE — Assessment & Plan Note (Signed)
Problem Based Documentation:    Subjective Report:  Tobacco use remains unchanged.  Not ready to quit     Assessment & Plan & Follow up Issues:  Chronic condition 1. Encouraged tobacco cessation once again and offered further counseling

## 2013-10-15 NOTE — Assessment & Plan Note (Addendum)
Problem Based Documentation:    Subjective Report:  Patient having persistent diabetic neuropathy pain.  She has been taking the Neurontin and not having significant improvement.  No new foot lesions.  Diabetic management by Dr. Buddy Duty.  Last seen 2 weeks ago; A1c at that time 9.0.  Now on Invokana.  See vaginitis section     Assessment & Plan & Follow up Issues:  Chronic, worsening condition. - patient should not increase tramadol dose given seizure history.  Patient reporting a rotten has not been helpful 1. Continue tramadol 100 mg Q. 12 hours 2. Start Lyrica, stop gabapentin >  Monitor closely for changes in mood, affect, seizure. Marland Kitchen

## 2013-10-15 NOTE — Assessment & Plan Note (Signed)
Problem Based Documentation:    Subjective Report:  Followed by Dr. Candis Schatz who continued her Klonopin.  Remains on Klonopin, Prozac, Lamictal, Seroquel, for mood disorder and seizure disorder.  No breakthrough seizures     Assessment & Plan & Follow up Issues:  Chronic, marginally well controlled condition 1. No changes to sedating medicines other than discontinuing a rotten.  Although this is a antiepileptic medication there is a low likelihood that this would precipitate any seizure activity.  Lyrica should have some protective effects as well.  > If patient has increased seizures should likely be off of tramadol

## 2013-10-15 NOTE — Assessment & Plan Note (Signed)
Problem Based Documentation:    Subjective Report:  Patient reports increased external vulvar itching and vaginal thick creamy white discharge.  Has not tried any specific medications for this.  Negative infection  None sexual partners  Has been started on [place] recently.     Assessment & Plan & Follow up Issues:  Acute condition - Significant yeast on self performed wet prep today. 1. Diflucan x1 > May need to consider stopping Invokana if this becomes recurrent.  This is a known side effect of Invokana and is a contraindication to its use if frequent recurrent infections occur.  We'll need to continue to monitor but I do agree that Anastasio Auerbach is a appropriate medication and at this time the benefits significantly outweighs the risk.  > If persistent will need to have formal pelvic exam performed .

## 2013-10-21 ENCOUNTER — Ambulatory Visit (INDEPENDENT_AMBULATORY_CARE_PROVIDER_SITE_OTHER): Payer: BC Managed Care – PPO | Admitting: Sports Medicine

## 2013-10-21 ENCOUNTER — Encounter: Payer: Self-pay | Admitting: Sports Medicine

## 2013-10-21 VITALS — BP 142/89 | HR 106 | Temp 98.3°F | Wt 237.0 lb

## 2013-10-21 DIAGNOSIS — IMO0002 Reserved for concepts with insufficient information to code with codable children: Secondary | ICD-10-CM

## 2013-10-21 DIAGNOSIS — E1165 Type 2 diabetes mellitus with hyperglycemia: Secondary | ICD-10-CM

## 2013-10-21 DIAGNOSIS — IMO0001 Reserved for inherently not codable concepts without codable children: Secondary | ICD-10-CM | POA: Diagnosis not present

## 2013-10-21 DIAGNOSIS — B3731 Acute candidiasis of vulva and vagina: Secondary | ICD-10-CM

## 2013-10-21 DIAGNOSIS — B373 Candidiasis of vulva and vagina: Secondary | ICD-10-CM

## 2013-10-21 DIAGNOSIS — N898 Other specified noninflammatory disorders of vagina: Secondary | ICD-10-CM | POA: Diagnosis not present

## 2013-10-21 DIAGNOSIS — F17200 Nicotine dependence, unspecified, uncomplicated: Secondary | ICD-10-CM

## 2013-10-21 LAB — POCT URINALYSIS DIPSTICK
Bilirubin, UA: NEGATIVE
Glucose, UA: >=1000
Ketones, UA: NEGATIVE
Leukocytes, UA: NEGATIVE
Nitrite, UA: NEGATIVE
Protein, UA: NEGATIVE
Spec Grav, UA: 1.015
Urobilinogen, UA: 0.2
pH, UA: 5.5

## 2013-10-21 LAB — POCT WET PREP (WET MOUNT): Clue Cells Wet Prep Whiff POC: NEGATIVE

## 2013-10-21 LAB — POCT UA - MICROSCOPIC ONLY

## 2013-10-21 MED ORDER — FLUCONAZOLE 150 MG PO TABS: 150.0000 mg | ORAL_TABLET | Freq: Once | ORAL | Status: DC

## 2013-10-21 MED ORDER — CLOTRIMAZOLE 1 % EX CREA: 1.0000 "application " | TOPICAL_CREAM | Freq: Two times a day (BID) | CUTANEOUS | Status: DC

## 2013-10-21 MED ORDER — PRAVASTATIN SODIUM 80 MG PO TABS: 80.0000 mg | ORAL_TABLET | Freq: Every day | ORAL | Status: DC

## 2013-10-21 MED ORDER — ALBUTEROL SULFATE HFA 108 (90 BASE) MCG/ACT IN AERS: 2.0000 | INHALATION_SPRAY | RESPIRATORY_TRACT | Status: DC | PRN

## 2013-10-21 NOTE — Progress Notes (Signed)
[name] - 43 y.o. female MRN [id number]  Date of birth: [birth year]  SUBJECTIVE:     CC: Follow-up See problem based charting for additional subjective (including HPI, Interval History & ROS)   HISTORY: [name]'s major active medical problems include: # CV Disease: hx of TIA, Sinus Tachycardia, HTN, HLD, DM, tobacco abuse, OSA, poor dentention - poorly controlled chronic diseases - Hospitalized in October 2014 for fatigue, areflexia, generalized weakness and chest pain  - Carotid ultrasound equals 1-39% carotid stenosis  - Echo = EF 55-65%, echodensity on aortic valve, TEE recommended # Uncontrolled DM: with peripheral neuropathy  Managed by Endocrinology - Buddy Duty  Most recent A1c (outside lab) 01/23/13 = 7.7 # Neuropsych: Seizure Disorder, Bipolar Disorder, Anxiety  Followed by Dr. Candis Schatz # RESP: COPD, Asthma, Sleep Apnea,  Other Pertinent Med/Surg/Hosp History: # History of laparoscopy for endometriosis   Follow up Issues:  Smoking    Recent Labs  02/04/13 1003 04/01/13 1609  HGBA1C  --  8.9*  TRIG 658*  --   CHOL 204*  --   HDL 34*  --   LDLCALC NOT CALC  --   TSH  --  0.783  } Wt Readings from Last 3 Encounters:  10/21/13 237 lb (107.502 kg)  10/04/13 237 lb 4.8 oz (107.639 kg)  05/29/13 224 lb (101.606 kg)   BP Readings from Last 3 Encounters:  10/21/13 142/89  10/04/13 133/82  05/29/13 107/57    History  Smoking status  . Current Every Day Smoker -- 0.50 packs/day for 20 years  . Types: Cigarettes  Smokeless tobacco  . Not on file   There are no preventive care reminders to display for this patient.  Otherwise past Medical, Surgical, Social, and Family History Reviewed per EMR Medications and Allergies reviewed and updated per below.  VITALS: BP 142/89  Pulse 106  Temp(Src) 98.3 F (36.8 C) (Oral)  Wt 237 lb (107.502 kg)  LMP 11/11/2011  PHYSICAL EXAM: GENERAL:  obese Caucasian female. In no discomfort; no respiratory distress  PSYCH:  alert and appropriate, good insight   EXTREM:    GU: Chaperone: CMA - ASHA  External:  significant lichenified and excoriated superficial vulvar lesions, normal appearing genitalia, no discharge, vaginal cream present   Vagina: no vaginal lesion, vaginal mucosa moist   Cervix: no cervical lesions, no CMT  TESTS:   wet prep: Yeast present     SKIN:     MEDICATIONS, LABS & OTHER ORDERS: Previous Medications   ASPIRIN EC 81 MG TABLET    Take 81 mg by mouth every morning.   ATORVASTATIN (LIPITOR) 80 MG TABLET    Take 1 tablet (80 mg total) by mouth every evening. Replacement for Crestor   CANAGLIFLOZIN (INVOKANA) 300 MG TABS    Take 300 mg by mouth every morning.   CLONAZEPAM (KLONOPIN) 1 MG TABLET    Take 0.5 tablets (0.5 mg total) by mouth 2 (two) times daily.   COLESEVELAM (WELCHOL) 625 MG TABLET    Take 1,250 mg by mouth 2 (two) times daily with a meal.   FLUCONAZOLE (DIFLUCAN) 150 MG TABLET    Take 1 tablet (150 mg total) by mouth once. Then repeat again in 3 days   FLUOXETINE (PROZAC) 40 MG CAPSULE    Take 40 mg by mouth daily.    FLUTICASONE-SALMETEROL (ADVAIR DISKUS) 500-50 MCG/DOSE AEPB    Inhale 1 puff into the lungs 2 (two) times daily.    INSULIN REGULAR HUMAN CONCENTRATED (HUMULIN R) 500 UNIT/ML  SOLN INJECTION    Inject 24-30 Units into the skin 2 (two) times daily with a meal. Pt will inject 24 units every morning and 30 units in the evening ONLY IF blood sugar is above 115.   LAMOTRIGINE (LAMICTAL) 200 MG TABLET    Take 200 mg by mouth at bedtime.   LISINOPRIL (PRINIVIL,ZESTRIL) 10 MG TABLET    Take 10 mg by mouth at bedtime.    PREGABALIN (LYRICA) 150 MG CAPSULE    Take 1 capsule (150 mg total) by mouth 2 (two) times daily.   QUETIAPINE (SEROQUEL XR) 400 MG 24 HR TABLET    Take 800 mg by mouth at bedtime.   TRAMADOL (ULTRAM) 50 MG TABLET    Take 2 tablets (100 mg total) by mouth every 12 (twelve) hours as needed.   Modified Medications   Modified Medication Previous  Medication   ALBUTEROL (PROVENTIL HFA;VENTOLIN HFA) 108 (90 BASE) MCG/ACT INHALER albuterol (PROVENTIL HFA;VENTOLIN HFA) 108 (90 BASE) MCG/ACT inhaler      Inhale 2 puffs into the lungs every 4 (four) hours as needed. For shortness of breath    Inhale 2 puffs into the lungs every 4 (four) hours as needed. For shortness of breath   New Prescriptions   CLOTRIMAZOLE (LOTRIMIN) 1 % CREAM    Apply 1 application topically 2 (two) times daily. Apply on the vulvar area   PRAVASTATIN (PRAVACHOL) 80 MG TABLET    Take 1 tablet (80 mg total) by mouth daily.   Discontinued Medications   No medications on file   Orders Placed This Encounter  Procedures  . POCT Urinalysis Dipstick  . POCT Wet Prep [name])  . POCT UA - Microscopic Only   ASSESSMENT & PLAN: See problem based charting & AVS for pt instructions.

## 2013-10-21 NOTE — Progress Notes (Deleted)
error 

## 2013-10-21 NOTE — Assessment & Plan Note (Signed)
Problem Based Documentation:    Subjective Report:  Persistent vulvar itching with persistent discharge.  No dysuria Pt denies any fevers, chills, or rigors.  Persistent polyuria given poorly controlled diabetes but this is essentially unchanged.  Has been off of her Invokana as well as her Lipitor per Dr. Cindra Eves recommendation.  Reports mild improvement after Diflucan but will return quickly.     Assessment & Plan & Follow up Issues:  Subacute, worsening condition Exam concerning for likely tinea cruris - UA negative 1. Retreat with Diflucan X2 2. Start topical treatment x2 weeks 3. Agree with discontinuation of Invokana but would consider other agent 4. Changed from atorvastatin to Pravachol; she reports not being able to afford Crestor although I would prefer a high potency statin; am also not clear regarding the association of atorvastatin and recurrent candidal infections; if the patient has misassociated this I would prefer her being on high potency statin.  ?Potential atorvastatin and Diflucan interaction?  Is a short course only so should not be an issue unless she is taking on a daily basis > Followup for improvement, consider vulvar biopsy if not improving but given presence of recurrent yeast this is likely the etiology  .

## 2013-10-21 NOTE — Assessment & Plan Note (Signed)
Problem Based Documentation:    Subjective Report:  Contemplative     Assessment & Plan & Follow up Issues:  Not ready to quit but considering

## 2013-10-21 NOTE — Assessment & Plan Note (Signed)
Problem Based Documentation:    Subjective Report:  Patient reports stopping her Invokana.  She is following up with Dr. Buddy Duty to discuss with starting an additional agent  Reports persistent symptoms as above     Assessment & Plan & Follow up Issues:  Chronic, poorly/difficult to control condition 1. Discontinue Invokana as patient stopped this herself 2. Followup with Dr. Buddy Duty

## 2013-10-21 NOTE — Patient Instructions (Signed)
Stop Atorvastatin.  Start Pravachol I have sent in a refill for your inhaler Pick up your BP meds  I have sent in a prescription for the rash and itching.    Follow up in 4 weeks

## 2013-10-23 ENCOUNTER — Other Ambulatory Visit: Payer: Self-pay | Admitting: *Deleted

## 2013-10-25 MED ORDER — TRAMADOL HCL 50 MG PO TABS: 100.0000 mg | ORAL_TABLET | Freq: Two times a day (BID) | ORAL | Status: DC | PRN

## 2013-10-25 NOTE — Telephone Encounter (Signed)
Will refill, but Rx will be printed. Can pick up at front desk.  Thanks, [employer]. Bindi Klomp, M.D.

## 2013-10-25 NOTE — Telephone Encounter (Signed)
Pt informed that Rx for Ultram needs to picked up.  Pt stated understanding.  Derl Barrow, RN

## 2013-11-04 ENCOUNTER — Telehealth: Payer: Self-pay | Admitting: Sports Medicine

## 2013-11-04 ENCOUNTER — Ambulatory Visit (INDEPENDENT_AMBULATORY_CARE_PROVIDER_SITE_OTHER): Payer: BC Managed Care – PPO | Admitting: Family Medicine

## 2013-11-04 ENCOUNTER — Ambulatory Visit (HOSPITAL_COMMUNITY)
Admission: RE | Admit: 2013-11-04 | Discharge: 2013-11-04 | Disposition: A | Discharge status: Home / Self Care | Payer: BC Managed Care – PPO | Source: Ambulatory Visit | Attending: Family Medicine | Admitting: Family Medicine

## 2013-11-04 ENCOUNTER — Ambulatory Visit (HOSPITAL_COMMUNITY)
Admission: RE | Admit: 2013-11-04 | Discharge: 2013-11-04 | Disposition: A | Discharge status: Home / Self Care | Payer: BC Managed Care – PPO | Source: Ambulatory Visit | Attending: Sports Medicine | Admitting: Sports Medicine

## 2013-11-04 VITALS — BP 156/71 | HR 100 | Temp 97.9°F | Ht 64.0 in | Wt 244.5 lb

## 2013-11-04 DIAGNOSIS — M7989 Other specified soft tissue disorders: Secondary | ICD-10-CM | POA: Diagnosis not present

## 2013-11-04 DIAGNOSIS — R0600 Dyspnea, unspecified: Secondary | ICD-10-CM | POA: Insufficient documentation

## 2013-11-04 DIAGNOSIS — R0989 Other specified symptoms and signs involving the circulatory and respiratory systems: Secondary | ICD-10-CM | POA: Diagnosis not present

## 2013-11-04 DIAGNOSIS — R059 Cough, unspecified: Secondary | ICD-10-CM | POA: Diagnosis not present

## 2013-11-04 DIAGNOSIS — I359 Nonrheumatic aortic valve disorder, unspecified: Secondary | ICD-10-CM

## 2013-11-04 DIAGNOSIS — R05 Cough: Secondary | ICD-10-CM | POA: Diagnosis not present

## 2013-11-04 DIAGNOSIS — R0609 Other forms of dyspnea: Secondary | ICD-10-CM | POA: Diagnosis not present

## 2013-11-04 MED ORDER — FUROSEMIDE 40 MG PO TABS: 40.0000 mg | ORAL_TABLET | Freq: Every day | ORAL | Status: DC

## 2013-11-04 NOTE — Patient Instructions (Signed)
Leg swelling/increased shortness of breath - likely due to fluid overload, take lasix one tablet daily for the next 3 day, check chest xray today (have completed over at the hospital), check EKG  Follow up in 3 days.

## 2013-11-04 NOTE — Telephone Encounter (Signed)
Patient scheduled to be seen at 11:30 cross cover.[place] per [place]

## 2013-11-04 NOTE — Assessment & Plan Note (Signed)
Patient reports increased dyspnea especially with lying down. EKG negative for acute ischemic changes. Suspect due to volume overload. COPD and OSA likely contributing. -Check chest x-ray to evaluate for any cardiopulmonary abnormality -Initiate treatment with Lasix 40 mg daily -Close followup in office

## 2013-11-04 NOTE — Telephone Encounter (Signed)
Pt called because she said that both of her feet a swollen and on one side water is coming out of it but she doesn't see in open wounds. jw

## 2013-11-04 NOTE — Assessment & Plan Note (Signed)
Patient presents with increased leg swelling and dyspnea, suspect that symptoms are due to fluid overload. -Check CMP to evaluate liver and kidney function -Check chest x-ray to rule out pneumonia -Initiate Lasix 40 mg daily -Patient to return to office in the next couple of days for followup

## 2013-11-04 NOTE — Progress Notes (Signed)
   Subjective:    Patient ID: [name], female    DOB: 1971/02/13, 43 y.o.   MRN: [id number]  HPI 43 year old female presents for evaluation of increased lower extremity swelling over the past week, patient has noted increased edema with fluid drainage from her bilateral feet, she has also noticed increased swelling in her upper extremities, she is associated dyspnea that is worse with lying down, she has noted a small amount weight gain, she's had intermittent chest discomfort, no current chest pain, pain is improved with emesis this morning, no radiation of pain, no associated palpitations or shortness of breath, patient reports 2 pillow orthopnea, no PND  COPD-patient does report mildly productive cough, she is currently taking her prescribed inhalers as indicated, has not been using her albuterol more frequently, patient uses 2 L of oxygen at night  Review of Systems  Constitutional: Negative for fever, chills and fatigue.  Respiratory: Positive for cough and shortness of breath.   Cardiovascular: Positive for chest pain and leg swelling.       Objective:   Physical Exam Vitals: Reviewed, weight up to 244 pounds today, up 20 pounds in the past 5 months General: Obese Caucasian female, no acute distress HEENT: Normocephalic, pupils are equal round and reactive to light, extraocular movements are intact, no scleral icterus, moist mucous members, uvula midline, neck was supple Cardiac: Regular rate and rhythm, S1 and S2 present, no murmurs, no heaves or thrills Respiratory: Scattered wheezes, diminished air entry at the bases, normal effort Abdomen: Soft, nontender, bowel sounds present Extremity: 1+ edema of bilateral lower extremities, 2+ radial pulses bilaterally, 2+ dorsalis pedis pulses bilaterally Skin: No current drainage, warm, dry, and intact   EKG performed in office today show no acute ischemic changes  Stress test performed in November 2014 showed patient to be low risk  for cardiac ischemia  Echocardiogram performed in September of 2014 showed ejection fraction of 73-71%, no diastolic dysfunction, there was a question of an echodensity of the aortic valve and TEE was recommended (patient has not discussed this with her cardiologist).      Assessment & Plan:  Please see problem specific assessment and plan.

## 2013-11-04 NOTE — Assessment & Plan Note (Signed)
Patient was counseled to followup with cardiology in regards to her echodensity. Echocardiogram report provided to the patient.

## 2013-11-05 ENCOUNTER — Telehealth: Payer: Self-pay | Admitting: Sports Medicine

## 2013-11-05 NOTE — Telephone Encounter (Signed)
Has appt with [facility] on May 7 at [place] would like to have results from EKG faxed to them Fax: [phone]

## 2013-11-05 NOTE — Telephone Encounter (Signed)
EKG and note faxed to [facility].[place]

## 2013-11-06 ENCOUNTER — Telehealth: Payer: Self-pay | Admitting: Family Medicine

## 2013-11-06 NOTE — Telephone Encounter (Signed)
Discussed xray results with patient

## 2013-11-07 ENCOUNTER — Ambulatory Visit: Payer: BC Managed Care – PPO | Admitting: Sports Medicine

## 2013-11-08 DIAGNOSIS — F3131 Bipolar disorder, current episode depressed, mild: Secondary | ICD-10-CM | POA: Diagnosis not present

## 2013-11-14 ENCOUNTER — Other Ambulatory Visit: Payer: BC Managed Care – PPO

## 2013-11-14 ENCOUNTER — Encounter: Payer: Self-pay | Admitting: Interventional Cardiology

## 2013-11-14 ENCOUNTER — Ambulatory Visit (INDEPENDENT_AMBULATORY_CARE_PROVIDER_SITE_OTHER): Payer: BC Managed Care – PPO | Admitting: Interventional Cardiology

## 2013-11-14 VITALS — BP 130/72 | HR 86 | Ht 64.0 in | Wt 242.8 lb

## 2013-11-14 DIAGNOSIS — M7989 Other specified soft tissue disorders: Secondary | ICD-10-CM

## 2013-11-14 DIAGNOSIS — E781 Pure hyperglyceridemia: Secondary | ICD-10-CM | POA: Diagnosis not present

## 2013-11-14 DIAGNOSIS — I359 Nonrheumatic aortic valve disorder, unspecified: Secondary | ICD-10-CM | POA: Diagnosis not present

## 2013-11-14 DIAGNOSIS — F17200 Nicotine dependence, unspecified, uncomplicated: Secondary | ICD-10-CM | POA: Diagnosis not present

## 2013-11-14 MED ORDER — FUROSEMIDE 40 MG PO TABS: 40.0000 mg | ORAL_TABLET | Freq: Every day | ORAL | Status: DC

## 2013-11-14 MED ORDER — METOPROLOL TARTRATE 25 MG PO TABS: 25.0000 mg | ORAL_TABLET | Freq: Two times a day (BID) | ORAL | Status: DC

## 2013-11-14 NOTE — Progress Notes (Signed)
Patient ID: [name], female   DOB: 11/02/1970, 43 y.o.   MRN: [id number] Patient ID: [name], female   DOB: 08-05-1970, 43 y.o.   MRN: [id number]     Patient ID: [name] MRN: [id number] DOB/AGE: January 25, 1971 43 y.o.   Referring Physician Dr. Paulla Fore   Reason for Consultation chest pain  HPI: 43 y/o who smokes.  SHe was hospitalized for chest pain.  She had  normal LV function by echocardiogram.   There was a calcific density on the aortic valve.  She has frequent chest tightness.  She treats it like heartburn.  There is relief with Alka Seltzer.  She takes prilosec.  Not related specifcally to exertion.  She does not think she would be able on walk on the treadmill.  She had a pharmologic stress test about 5 years ago.  She has leg weakness.    She had all of her teeth removed. She has had some dizziness. She denies syncope. She has not had diaphoresis. Her eating is limited due to her teeth. She does not report nausea.  She does say that she is not ready to quit smoking.    She feels that she is retaining fluid.  Her weight has increased.  She had a fracture of her left ankle.  She now has some swelling there.   Current Outpatient Prescriptions  Medication Sig Dispense Refill  . albuterol (PROVENTIL HFA;VENTOLIN HFA) 108 (90 BASE) MCG/ACT inhaler Inhale 2 puffs into the lungs every 4 (four) hours as needed. For shortness of breath  1 Inhaler  2  . aspirin EC 81 MG tablet Take 81 mg by mouth every morning.      Marland Kitchen atorvastatin (LIPITOR) 80 MG tablet Take 1 tablet (80 mg total) by mouth every evening. Replacement for Crestor  90 tablet  3  . clonazePAM (KLONOPIN) 1 MG tablet Take 0.5 tablets (0.5 mg total) by mouth 2 (two) times daily.      . clotrimazole (LOTRIMIN) 1 % cream Apply 1 application topically 2 (two) times daily. Apply on the vulvar area  30 g  0  . colesevelam (WELCHOL) 625 MG tablet Take 1,250 mg by mouth 2 (two) times daily with a meal.      . FLUoxetine (PROZAC)  40 MG capsule Take 40 mg by mouth daily.       . Fluticasone-Salmeterol (ADVAIR DISKUS) 500-50 MCG/DOSE AEPB Inhale 1 puff into the lungs 2 (two) times daily.       . furosemide (LASIX) 40 MG tablet Take 1 tablet (40 mg total) by mouth daily.  10 tablet  0  . insulin regular human CONCENTRATED (HUMULIN R) 500 UNIT/ML SOLN injection Inject 24-30 Units into the skin 2 (two) times daily with a meal. Pt will inject 24 units every morning and 30 units in the evening ONLY IF blood sugar is above 115.      . lamoTRIgine (LAMICTAL) 200 MG tablet Take 200 mg by mouth at bedtime.      Marland Kitchen lisinopril (PRINIVIL,ZESTRIL) 10 MG tablet Take 10 mg by mouth at bedtime.       . pravastatin (PRAVACHOL) 80 MG tablet Take 1 tablet (80 mg total) by mouth daily.  90 tablet  3  . pregabalin (LYRICA) 150 MG capsule Take 1 capsule (150 mg total) by mouth 2 (two) times daily.  60 capsule  5  . QUEtiapine (SEROQUEL XR) 400 MG 24 hr tablet Take 800 mg by mouth at bedtime.      Marland Kitchen  traMADol (ULTRAM) 50 MG tablet Take 2 tablets (100 mg total) by mouth every 12 (twelve) hours as needed.  120 tablet  0   No current facility-administered medications for this visit.   Past Medical History  Diagnosis Date  . Asthma   . COPD (chronic obstructive pulmonary disease)   . Diabetes mellitus   . Renal disorder   . Hypertension   . Seizures   . Syncope   . Bipolar 1 disorder   . DVT (deep venous thrombosis)     2011- was on coumadin for a year  . Stroke     2010  . GERD (gastroesophageal reflux disease)   . Hyperlipidemia     Family History  Problem Relation Age of Onset  . Venous thrombosis Brother   . Asthma Father   . Diabetes Father   . Coronary artery disease Mother   . Hypertension Mother   . Diabetes Mother   . Asthma Sister     History   Social History  . Marital Status: Married    Spouse Name: N/A    Number of Children: N/A  . Years of Education: N/A   Occupational History  . Not on file.   Social  History Main Topics  . Smoking status: Current Every Day Smoker -- 0.50 packs/day for 20 years    Types: Cigarettes  . Smokeless tobacco: Not on file  . Alcohol Use: No  . Drug Use: No  . Sexual Activity: Yes   Other Topics Concern  . Not on file   Social History Narrative   Lives in [place]   Married but lives with Boyfriend - not legally separated   Disabled - for BiPolar, Seizure disorder, diabetes   Formerly worked at [employer]          Past Surgical History  Procedure Laterality Date  . Laparoscopy      Endometriosis      (Not in a hospital admission)  Review of systems complete and found to be negative unless listed above .  No nausea, vomiting.  No fever chills, No focal weakness,  No palpitations.  Physical Exam: Filed Vitals:   11/14/13 0901  BP: 130/72  Pulse: 86    Weight: 242 lb 12.8 oz (110.133 kg)  Physical exam:  [place]/AT EOMI No JVD, No carotid bruit RRR S1S2  No wheezing Soft. NT, nondistended Left ankle edema. No focal motor or sensory deficits Normal affect  Labs:   Lab Results  Component Value Date   WBC 10.0 04/02/2013   HGB 13.4 04/02/2013   HCT 38.8 04/02/2013   MCV 86.8 04/02/2013   PLT 250 04/02/2013   No results found for this basename: NA, K, CL, CO2, BUN, CREATININE, CALCIUM, LABALBU, PROT, BILITOT, ALKPHOS, ALT, AST, GLUCOSE,  in the last 168 hours Lab Results  Component Value Date   CKTOTAL 90 10/08/2011   CKMB 1.7 10/08/2011   TROPONINI <0.30 04/02/2013    Lab Results  Component Value Date   CHOL 204* 02/04/2013   CHOL  Value: 172        ATP III CLASSIFICATION:  <200     mg/dL   Desirable  200-239  mg/dL   Borderline High  >=240    mg/dL   High        07/26/2008   CHOL  Value: 217        ATP III CLASSIFICATION:  <200     mg/dL   Desirable  200-239  mg/dL  Borderline High  >=240    mg/dL   High* 09/02/2007   Lab Results  Component Value Date   HDL 34* 02/04/2013   HDL 24* 07/26/2008   HDL 32* 09/02/2007   Lab Results  Component  Value Date   LDLCALC NOT CALC 02/04/2013   LDLCALC  Value: 78        Total Cholesterol/HDL:CHD Risk Coronary Heart Disease Risk Table                     Men   Women  1/2 Average Risk   3.4   3.3  Average Risk       5.0   4.4  2 X Average Risk   9.6   7.1  3 X Average Risk  23.4   11.0        Use the calculated Patient Ratio above and the CHD Risk Table to determine the patient's CHD Risk.        ATP III CLASSIFICATION (LDL):  <100     mg/dL   Optimal  100-129  mg/dL   Near or Above                    Optimal  130-159  mg/dL   Borderline  160-189  mg/dL   High  >190     mg/dL   Very High 07/26/2008   LDLCALC  Value: UNABLE TO CALCULATE IF TRIGLYCERIDE OVER 400 mg/dL        Total Cholesterol/HDL:CHD Risk Coronary Heart Disease Risk Table                     Men   Women  1/2 Average Risk   3.4   3.3 09/02/2007   Lab Results  Component Value Date   TRIG 658* 02/04/2013   TRIG 348* 07/26/2008   TRIG 462* 09/02/2007   Lab Results  Component Value Date   CHOLHDL 6.0 02/04/2013   CHOLHDL 7.2 07/26/2008   CHOLHDL 6.8 09/02/2007   Lab Results  Component Value Date   LDLDIRECT 145* 04/30/2009       EKG: NSR, prolonged QT, PRWP, NSST  ASSESSMENT AND PLAN:  Atypical chest pain: Several RF for CAD including smoking.  She doesn't think she can walk on the treadmill. pharmacologic stress test negative for ischemia.  Sx have persisted, with exertion as well.  She wears oxygen at night.  We discussed cardiac cath.  Will start Metoprolol 25 BID.  Did not tolerate SL NTG in the past due to headache.  If symptoms persist, we'll plan for cardiac cath. Of note she has some moderate carotid disease.  Smoking:  We spoke about the importance of stopping smoking.  She also has several abscessed teeth that have come out. No clinical signs of endocarditis. No recurrent fevers. No malaise. I would not pursue transesophageal echocardiogram. She has no signs of heart failure. No obvious murmurs on  exam.  Hypertriglyceridemia/hyperlipidemia: Her triglycerides have been over 600. Continue Crestor. She may benefit from fish oil.  Would start at 2 g by mouth twice a day.  Continue Lasix for edema. Encouraged elevating legs at night. She can also use compression stockings. Refill Lasix. Check electrolytes in a week. I think the left ankle may be more related to her prior ankle fracture. There is not much pitting edema above the level of the ankle.  Signed:   Mina Marble, MD, [facility] 11/14/2013, 9:39 AM

## 2013-11-14 NOTE — Patient Instructions (Signed)
Your physician has recommended you make the following change in your medication:   1. Start metoprolol tartrate 25 mg 1 tablet twice a day.  Your physician recommends that you return for lab work in 1 week.   Your physician recommends that you schedule a follow-up appointment in: 2 months with Dr. Irish Lack.

## 2013-11-18 DIAGNOSIS — E1149 Type 2 diabetes mellitus with other diabetic neurological complication: Secondary | ICD-10-CM | POA: Diagnosis not present

## 2013-11-18 DIAGNOSIS — N189 Chronic kidney disease, unspecified: Secondary | ICD-10-CM | POA: Diagnosis not present

## 2013-11-18 DIAGNOSIS — F17200 Nicotine dependence, unspecified, uncomplicated: Secondary | ICD-10-CM | POA: Diagnosis not present

## 2013-11-18 DIAGNOSIS — E785 Hyperlipidemia, unspecified: Secondary | ICD-10-CM | POA: Diagnosis not present

## 2013-11-18 DIAGNOSIS — Z6839 Body mass index (BMI) 39.0-39.9, adult: Secondary | ICD-10-CM | POA: Diagnosis not present

## 2013-11-18 DIAGNOSIS — E1142 Type 2 diabetes mellitus with diabetic polyneuropathy: Secondary | ICD-10-CM | POA: Diagnosis not present

## 2013-11-18 DIAGNOSIS — E1165 Type 2 diabetes mellitus with hyperglycemia: Secondary | ICD-10-CM | POA: Diagnosis not present

## 2013-11-18 DIAGNOSIS — E669 Obesity, unspecified: Secondary | ICD-10-CM | POA: Diagnosis not present

## 2013-11-18 DIAGNOSIS — E1129 Type 2 diabetes mellitus with other diabetic kidney complication: Secondary | ICD-10-CM | POA: Diagnosis not present

## 2013-11-19 ENCOUNTER — Ambulatory Visit (INDEPENDENT_AMBULATORY_CARE_PROVIDER_SITE_OTHER): Payer: BC Managed Care – PPO | Admitting: Sports Medicine

## 2013-11-19 ENCOUNTER — Ambulatory Visit (HOSPITAL_COMMUNITY)
Admission: RE | Admit: 2013-11-19 | Discharge: 2013-11-19 | Disposition: A | Discharge status: Home / Self Care | Payer: BC Managed Care – PPO | Source: Ambulatory Visit | Attending: Sports Medicine | Admitting: Sports Medicine

## 2013-11-19 VITALS — BP 121/61 | HR 112 | Temp 99.6°F | Ht 64.0 in | Wt 242.0 lb

## 2013-11-19 DIAGNOSIS — G4730 Sleep apnea, unspecified: Secondary | ICD-10-CM

## 2013-11-19 DIAGNOSIS — R0789 Other chest pain: Secondary | ICD-10-CM

## 2013-11-19 DIAGNOSIS — E1165 Type 2 diabetes mellitus with hyperglycemia: Secondary | ICD-10-CM

## 2013-11-19 DIAGNOSIS — IMO0001 Reserved for inherently not codable concepts without codable children: Secondary | ICD-10-CM

## 2013-11-19 DIAGNOSIS — I209 Angina pectoris, unspecified: Secondary | ICD-10-CM

## 2013-11-19 DIAGNOSIS — J449 Chronic obstructive pulmonary disease, unspecified: Secondary | ICD-10-CM

## 2013-11-19 DIAGNOSIS — F17200 Nicotine dependence, unspecified, uncomplicated: Secondary | ICD-10-CM

## 2013-11-19 DIAGNOSIS — IMO0002 Reserved for concepts with insufficient information to code with codable children: Secondary | ICD-10-CM

## 2013-11-19 MED ORDER — ISOSORBIDE MONONITRATE 15 MG HALF TABLET: 15.0000 mg | ORAL_TABLET | Freq: Every day | ORAL | Status: DC

## 2013-11-19 NOTE — Patient Instructions (Addendum)
I want you to be on the Atorvastatin.  If you still have that please stop the Pravachol.   Take the pravachol if you don't have the atorvastatin.  Will try low dose Imdur to help with the chest pain and you need to follow up with Dr. Irish Lack as soon as possible

## 2013-11-19 NOTE — Progress Notes (Signed)
[name] - 43 y.o. female MRN [id number]  Date of birth: 1970/09/01  CC & SUBJECTIVE:     If applicable see problem based charting for additional problem specific subjective (including HPI, Interval History & ROS)  HPI Comments: Patient presents with:   Chest Pain - worsening with Metoprolol within 30 minutes; lasts 5-10 min;  4 doses with recurrent effects   Diabetes - Victoza started   Breathing Problem - no history of OSA but uses O2 at night.  Needs documentation for continued use.   HISTORY: Past Medical, Surgical, Social, and Family History Reviewed & Updated per EMR.  Pertinent Historical Findings include: [name]'s major active medical problems include: # CV Disease: hx of TIA, Sinus Tachycardia, HTN, HLD, DM, tobacco abuse, OSA, poor dentention - poorly controlled chronic diseases - Hospitalized in October 2014 for fatigue, areflexia, generalized weakness and chest pain  - Carotid ultrasound equals 1-39% carotid stenosis  - Echo = EF 55-65%, echodensity on aortic valve, TEE recommended # Uncontrolled DM: with peripheral neuropathy  Managed by Endocrinology - [name]  Most recent A1c (outside lab) 01/23/13 = 7.7 # Neuropsych: Seizure Disorder, Bipolar Disorder, Anxiety  Followed by Dr. Candis Schatz # RESP: COPD, Asthma, Sleep Apnea,  Other Pertinent Med/Surg/Hosp History: # History of laparoscopy for endometriosis   Follow up Issues:  Smoking   OBJECTIVE:  BP:121/61 mmHg  HR:112bpm  TEMP:99.6 F (37.6 C)(Oral)  RESP:   HT:5\' 4"  (162.6 cm)   WT:242 lb (109.77 kg)  BMI:41.6 Physical Exam  Constitutional: She is well-developed, well-nourished, and in no distress. No distress.  HENT:  Head: Normocephalic and atraumatic.  Right Ear: External ear normal.  Left Ear: External ear normal.  Eyes: Right eye exhibits no discharge. Left eye exhibits no discharge. No scleral icterus.  Neck: No JVD present. No tracheal deviation present. No thyromegaly present.    Cardiovascular: Regular rhythm and normal heart sounds.  Tachycardia present.  Exam reveals no gallop and no friction rub.   No murmur heard. EKG 5/12 - sinus tachycardia, no evidence of ischemia, normal intervals, normal axis  Pulmonary/Chest: Effort normal and breath sounds normal. No respiratory distress. She has no wheezes.  Abdominal: She exhibits no distension. There is no tenderness. There is no rebound.  Musculoskeletal: She exhibits no edema and no tenderness.  Neurological: She is alert.  Moves all 4 extremities spontaneously; no lateralization.  Skin: Skin is warm and dry. She is not diaphoretic.  Psychiatric: Mood, memory, affect and judgment normal.  Flattened affect but consistent with prior. Here today with her husband.    MEDICATIONS, LABS & OTHER ORDERS: Previous Medications   ALBUTEROL (PROVENTIL HFA;VENTOLIN HFA) 108 (90 BASE) MCG/ACT INHALER    Inhale 2 puffs into the lungs every 4 (four) hours as needed. For shortness of breath   ASPIRIN EC 81 MG TABLET    Take 81 mg by mouth every morning.   ATORVASTATIN (LIPITOR) 80 MG TABLET    Take 1 tablet (80 mg total) by mouth every evening. Replacement for Crestor   CLONAZEPAM (KLONOPIN) 1 MG TABLET    Take 0.5 tablets (0.5 mg total) by mouth 2 (two) times daily.   CLOTRIMAZOLE (LOTRIMIN) 1 % CREAM    Apply 1 application topically 2 (two) times daily. Apply on the vulvar area   COLESEVELAM (WELCHOL) 625 MG TABLET    Take 1,250 mg by mouth 2 (two) times daily with a meal.   FLUOXETINE (PROZAC) 40 MG CAPSULE    Take 40 mg by mouth  daily.    FLUTICASONE-SALMETEROL (ADVAIR DISKUS) 500-50 MCG/DOSE AEPB    Inhale 1 puff into the lungs 2 (two) times daily.    FUROSEMIDE (LASIX) 40 MG TABLET    Take 1 tablet (40 mg total) by mouth daily.   INSULIN REGULAR HUMAN CONCENTRATED (HUMULIN R) 500 UNIT/ML SOLN INJECTION    Inject 24-30 Units into the skin 2 (two) times daily with a meal. Pt will inject 24 units every morning and 30 units in  the evening ONLY IF blood sugar is above 115.   LAMOTRIGINE (LAMICTAL) 200 MG TABLET    Take 200 mg by mouth at bedtime.   LISINOPRIL (PRINIVIL,ZESTRIL) 10 MG TABLET    Take 10 mg by mouth at bedtime.    PRAVASTATIN (PRAVACHOL) 80 MG TABLET    Take 1 tablet (80 mg total) by mouth daily.   PREGABALIN (LYRICA) 150 MG CAPSULE    Take 1 capsule (150 mg total) by mouth 2 (two) times daily.   QUETIAPINE (SEROQUEL XR) 400 MG 24 HR TABLET    Take 800 mg by mouth at bedtime.   TRAMADOL (ULTRAM) 50 MG TABLET    Take 2 tablets (100 mg total) by mouth every 12 (twelve) hours as needed.   Modified Medications   No medications on file   New Prescriptions   ISOSORBIDE MONONITRATE (IMDUR) 15 MG TB24 24 HR TABLET    Take 0.5 tablets (15 mg total) by mouth daily.   RANOLAZINE (RANEXA) 500 MG 12 HR TABLET    Take 1 tablet (500 mg total) by mouth 2 (two) times daily.   Discontinued Medications   METOPROLOL TARTRATE (LOPRESSOR) 25 MG TABLET    Take 1 tablet (25 mg total) by mouth 2 (two) times daily.   Orders Placed This Encounter  Procedures  . EKG 12-Lead  . Split night study   ASSESSMENT & PLAN: See problem based charting & AVS for pt instructions.

## 2013-11-21 ENCOUNTER — Other Ambulatory Visit (INDEPENDENT_AMBULATORY_CARE_PROVIDER_SITE_OTHER): Payer: BC Managed Care – PPO

## 2013-11-21 DIAGNOSIS — M7989 Other specified soft tissue disorders: Secondary | ICD-10-CM

## 2013-11-21 LAB — BASIC METABOLIC PANEL
BUN: 14 mg/dL (ref 6–23)
CO2: 27 mEq/L (ref 19–32)
Calcium: 9.2 mg/dL (ref 8.4–10.5)
Chloride: 95 mEq/L — ABNORMAL LOW (ref 96–112)
Creatinine, Ser: 0.9 mg/dL (ref 0.4–1.2)
GFR: 74.89 mL/min (ref >60.00)
Glucose, Bld: 360 mg/dL — ABNORMAL HIGH (ref 70–99)
Potassium: 4.5 mEq/L (ref 3.5–5.1)
Sodium: 129 mEq/L — ABNORMAL LOW (ref 135–145)

## 2013-11-21 NOTE — Progress Notes (Signed)
Quick Note:  Preliminary report reviewed by triage nurse and sent to MD desk. ______ 

## 2013-11-22 ENCOUNTER — Encounter: Payer: Self-pay | Admitting: Cardiology

## 2013-11-22 ENCOUNTER — Encounter: Payer: Self-pay | Admitting: Sports Medicine

## 2013-11-22 ENCOUNTER — Encounter: Payer: Self-pay | Admitting: Interventional Cardiology

## 2013-11-22 ENCOUNTER — Ambulatory Visit (INDEPENDENT_AMBULATORY_CARE_PROVIDER_SITE_OTHER): Payer: BC Managed Care – PPO | Admitting: Interventional Cardiology

## 2013-11-22 VITALS — BP 122/64 | HR 98

## 2013-11-22 DIAGNOSIS — R0789 Other chest pain: Secondary | ICD-10-CM | POA: Diagnosis not present

## 2013-11-22 DIAGNOSIS — I359 Nonrheumatic aortic valve disorder, unspecified: Secondary | ICD-10-CM | POA: Diagnosis not present

## 2013-11-22 DIAGNOSIS — I209 Angina pectoris, unspecified: Secondary | ICD-10-CM

## 2013-11-22 DIAGNOSIS — F17200 Nicotine dependence, unspecified, uncomplicated: Secondary | ICD-10-CM | POA: Diagnosis not present

## 2013-11-22 DIAGNOSIS — IMO0001 Reserved for inherently not codable concepts without codable children: Secondary | ICD-10-CM

## 2013-11-22 DIAGNOSIS — R030 Elevated blood-pressure reading, without diagnosis of hypertension: Secondary | ICD-10-CM

## 2013-11-22 DIAGNOSIS — E781 Pure hyperglyceridemia: Secondary | ICD-10-CM

## 2013-11-22 MED ORDER — RANOLAZINE ER 500 MG PO TB12: 500.0000 mg | ORAL_TABLET | Freq: Two times a day (BID) | ORAL | Status: DC

## 2013-11-22 NOTE — Assessment & Plan Note (Signed)
Problem Based Documentation:    Subjective Report:  Patient has been started on Victoza.  No problems currently noted  Pt denies hypoglycemic symptoms/episodes.  No reported changes in polyuria/polydipsia. Pt is compliant with foot exams and denies any new foot lesions or new sensory changes/dysesthesias.  Patient also mentions she is considering weight loss surgery.  Interested in Roux-en-Y     Assessment & Plan & Follow up Issues:  Chronic, Poorly controlled condition 1. Continued physical no further changes, would recommend to continue to avoid Invokana given GU history 2. Encouraged followup with weight loss surgery information classes.  > Needs continued improved control, weight loss surgery would likely be of benefit but needs cardiac and OSA evaluation first.

## 2013-11-22 NOTE — Assessment & Plan Note (Signed)
Problem Based Documentation:    Subjective Report:  Patient reports being on oxygen at home.  I do not have any recommendations overnight pulse oximetry.  Her husband reports frequent snoring with questionable apneic events.  She has never undergone formal sleep study as she can recall.  Significant daytime somnolence     Assessment & Plan & Follow up Issues:  Chronic, no PPV treatment; supplemental O2 only at this time 1. Order overnight sleep study given likelihood of OSA

## 2013-11-22 NOTE — Patient Instructions (Addendum)
Your physician has requested that you have a TEE. During a TEE, sound waves are used to create images of your heart. It provides your doctor with information about the size and shape of your heart and how well your heart's chambers and valves are working. In this test, a transducer is attached to the end of a flexible tube that's guided down your throat and into your esophagus (the tube leading from you mouth to your stomach) to get a more detailed image of your heart. You are not awake for the procedure. Please see the instruction sheet given to you today. For further information please visit [email or web address].  Your physician has requested that you have a cardiac catheterization. Cardiac catheterization is used to diagnose and/or treat various heart conditions. Doctors may recommend this procedure for a number of different reasons. The most common reason is to evaluate chest pain. Chest pain can be a symptom of coronary artery disease (CAD), and cardiac catheterization can show whether plaque is narrowing or blocking your heart's arteries. This procedure is also used to evaluate the valves, as well as measure the blood flow and oxygen levels in different parts of your heart. For further information please visit [email or web address]. Please follow instruction sheet, as given.  Your physician recommends that you return for lab work 11/28/13  Your physician has recommended you make the following change in your medication:   1. Start Ranexa 500 mg 1 tablet twice a day.

## 2013-11-22 NOTE — Assessment & Plan Note (Signed)
Problem Based Documentation:    Subjective Report:  Patient reports trying metoprolol for 4 doses but experienced significant midsternal chest pain that began typically 30 minutes following taking the dose and resolved spontaneously.  No associated diaphoresis, impending doom or significant dyspnea.  She has self discontinued this medicine.  She continues to have 8/10 chest pain on a daily basis and these episodes would flare to 10 out of 10  Cardiology recommended considering cardiac catheterization if fails medical management.    She's previously failed nitroglycerin therapy due to migraines but has never been tried on long acting      Assessment & Plan & Follow up Issues:   acute on chronic relapsing condition - Have called and spoken to the doc of the Day at [facility] (Dr. Meda Coffee) given her concerning symptoms.  We discussed the appropriateness of continuing to hold the Toprol and add long acting nitroglycerin with short-term followup with Dr. Irish Lack.  1. Imdur 2. Follow up with Dr. Beau Fanny for likely CATH. Pt agreeable today.

## 2013-11-22 NOTE — Progress Notes (Signed)
Patient ID: [name], female   DOB: 1971/03/09, 43 y.o.   MRN: [id number] Patient ID: [name], female   DOB: 19-Jun-1971, 43 y.o.   MRN: [id number] Patient ID: [name], female   DOB: 1971/02/09, 43 y.o.   MRN: [id number]     Patient ID: [name] MRN: [id number] DOB/AGE: [birth year] 43 y.o.   Referring Physician Dr. Paulla Fore   Reason for Consultation chest pain  HPI: 43 y/o who smokes.  SHe was hospitalized for chest pain.  She had  normal LV function by echocardiogram.   There was a calcific density on the aortic valve.  She has frequent chest tightness.  She treats it like heartburn.  There is relief with Alka Seltzer.  She takes prilosec.  Not related specifcally to exertion.  She does not think she would be able on walk on the treadmill.  She had a pharmologic stress test about 5 years ago.  She has leg weakness.    She had all of her teeth removed. She has had some dizziness. She denies syncope. She has not had diaphoresis. Her eating is limited due to her teeth. She does not report nausea.  She does say that she is not ready to quit smoking.    She feels that she is retaining fluid.  Her weight has increased.  She had a fracture of her left ankle.  She now has some swelling there.  She continues to have chest discomfort. She denies any fevers or chills.   Current Outpatient Prescriptions  Medication Sig Dispense Refill  . albuterol (PROVENTIL HFA;VENTOLIN HFA) 108 (90 BASE) MCG/ACT inhaler Inhale 2 puffs into the lungs every 4 (four) hours as needed. For shortness of breath  1 Inhaler  2  . aspirin EC 81 MG tablet Take 81 mg by mouth every morning.      Marland Kitchen atorvastatin (LIPITOR) 80 MG tablet Take 1 tablet (80 mg total) by mouth every evening. Replacement for Crestor  90 tablet  3  . clonazePAM (KLONOPIN) 1 MG tablet Take 0.5 tablets (0.5 mg total) by mouth 2 (two) times daily.      . clotrimazole (LOTRIMIN) 1 % cream Apply 1 application topically 2 (two) times daily.  Apply on the vulvar area  30 g  0  . colesevelam (WELCHOL) 625 MG tablet Take 1,250 mg by mouth 2 (two) times daily with a meal.      . FLUoxetine (PROZAC) 40 MG capsule Take 40 mg by mouth daily.       . Fluticasone-Salmeterol (ADVAIR DISKUS) 500-50 MCG/DOSE AEPB Inhale 1 puff into the lungs 2 (two) times daily.       . furosemide (LASIX) 40 MG tablet Take 1 tablet (40 mg total) by mouth daily.  30 tablet  6  . insulin regular human CONCENTRATED (HUMULIN R) 500 UNIT/ML SOLN injection Inject 24-30 Units into the skin 2 (two) times daily with a meal. Pt will inject 24 units every morning and 30 units in the evening ONLY IF blood sugar is above 115.      . isosorbide mononitrate (IMDUR) 15 mg TB24 24 hr tablet Take 0.5 tablets (15 mg total) by mouth daily.  30 tablet  1  . lamoTRIgine (LAMICTAL) 200 MG tablet Take 200 mg by mouth at bedtime.      Marland Kitchen lisinopril (PRINIVIL,ZESTRIL) 10 MG tablet Take 10 mg by mouth at bedtime.       . pravastatin (PRAVACHOL) 80 MG tablet Take 1 tablet (80 mg total) by  mouth daily.  90 tablet  3  . pregabalin (LYRICA) 150 MG capsule Take 1 capsule (150 mg total) by mouth 2 (two) times daily.  60 capsule  5  . QUEtiapine (SEROQUEL XR) 400 MG 24 hr tablet Take 800 mg by mouth at bedtime.      . traMADol (ULTRAM) 50 MG tablet Take 2 tablets (100 mg total) by mouth every 12 (twelve) hours as needed.  120 tablet  0   No current facility-administered medications for this visit.   Past Medical History  Diagnosis Date  . Asthma   . COPD (chronic obstructive pulmonary disease)   . Diabetes mellitus   . Renal disorder   . Hypertension   . Seizures   . Syncope   . Bipolar 1 disorder   . DVT (deep venous thrombosis)     2011- was on coumadin for a year  . Stroke     2010  . GERD (gastroesophageal reflux disease)   . Hyperlipidemia     Family History  Problem Relation Age of Onset  . Venous thrombosis Brother   . Asthma Father   . Diabetes Father   . Coronary artery  disease Mother   . Hypertension Mother   . Diabetes Mother   . Asthma Sister     History   Social History  . Marital Status: Married    Spouse Name: N/A    Number of Children: N/A  . Years of Education: N/A   Occupational History  . Not on file.   Social History Main Topics  . Smoking status: Current Every Day Smoker -- 0.50 packs/day for 20 years    Types: Cigarettes  . Smokeless tobacco: Not on file  . Alcohol Use: No  . Drug Use: No  . Sexual Activity: Yes   Other Topics Concern  . Not on file   Social History Narrative   Lives in [place]   Married but lives with Boyfriend - not legally separated   Disabled - for BiPolar, Seizure disorder, diabetes   Formerly worked at [employer]          Past Surgical History  Procedure Laterality Date  . Laparoscopy      Endometriosis      (Not in a hospital admission)  Review of systems complete and found to be negative unless listed above .  No nausea, vomiting.  No fever chills, No focal weakness,  No palpitations.  Physical Exam: Filed Vitals:   11/22/13 1400  BP: 122/64  Pulse: 98       Physical exam:  [place]/AT EOMI No JVD, No carotid bruit RRR S1S2  No wheezing Soft. NT, nondistended Left ankle edema. 2+ right radial pulse No focal motor or sensory deficits Normal affect  Labs:   Lab Results  Component Value Date   WBC 10.0 04/02/2013   HGB 13.4 04/02/2013   HCT 38.8 04/02/2013   MCV 86.8 04/02/2013   PLT 250 04/02/2013     Recent Labs Lab 11/21/13 0858  NA 129*  K 4.5  CL 95*  CO2 27  BUN 14  CREATININE 0.9  CALCIUM 9.2  GLUCOSE 360*   Lab Results  Component Value Date   CKTOTAL 90 10/08/2011   CKMB 1.7 10/08/2011   TROPONINI <0.30 04/02/2013    Lab Results  Component Value Date   CHOL 204* 02/04/2013   CHOL  Value: 172        ATP III CLASSIFICATION:  <200     mg/dL  Desirable  200-239  mg/dL   Borderline High  >=240    mg/dL   High        07/26/2008   CHOL  Value: 217        ATP III  CLASSIFICATION:  <200     mg/dL   Desirable  200-239  mg/dL   Borderline High  >=240    mg/dL   High* 09/02/2007   Lab Results  Component Value Date   HDL 34* 02/04/2013   HDL 24* 07/26/2008   HDL 32* 09/02/2007   Lab Results  Component Value Date   LDLCALC NOT CALC 02/04/2013   LDLCALC  Value: 78        Total Cholesterol/HDL:CHD Risk Coronary Heart Disease Risk Table                     Men   Women  1/2 Average Risk   3.4   3.3  Average Risk       5.0   4.4  2 X Average Risk   9.6   7.1  3 X Average Risk  23.4   11.0        Use the calculated Patient Ratio above and the CHD Risk Table to determine the patient's CHD Risk.        ATP III CLASSIFICATION (LDL):  <100     mg/dL   Optimal  100-129  mg/dL   Near or Above                    Optimal  130-159  mg/dL   Borderline  160-189  mg/dL   High  >190     mg/dL   Very High 07/26/2008   LDLCALC  Value: UNABLE TO CALCULATE IF TRIGLYCERIDE OVER 400 mg/dL        Total Cholesterol/HDL:CHD Risk Coronary Heart Disease Risk Table                     Men   Women  1/2 Average Risk   3.4   3.3 09/02/2007   Lab Results  Component Value Date   TRIG 658* 02/04/2013   TRIG 348* 07/26/2008   TRIG 462* 09/02/2007   Lab Results  Component Value Date   CHOLHDL 6.0 02/04/2013   CHOLHDL 7.2 07/26/2008   CHOLHDL 6.8 09/02/2007   Lab Results  Component Value Date   LDLDIRECT 145* 04/30/2009       EKG: NSR, prolonged QT, PRWP, NSST  ASSESSMENT AND PLAN:  Atypical chest pain: Several RF for CAD including smoking.  She doesn't think she can walk on the treadmill. pharmacologic stress test negative for ischemia.  Sx have persisted, with exertion as well.  She wears oxygen at night.  We discussed cardiac cath and she is agreeable.  The risks and benefits of the procedure were explained.  Did not tolerate Metoprolol 25 BID.  Did not tolerate SL NTG in the past due to headache.  If symptoms persist, we'll plan for cardiac cath. Of note she has some moderate carotid disease.   Start Ranexa 500 mg by mouth twice a day.  Smoking:  We spoke about the importance of stopping smoking.   Hypertriglyceridemia/hyperlipidemia: Her triglycerides have been over 600. Continue Crestor. She may benefit from fish oil.  Would start at 2 g by mouth twice a day.  Continue Lasix for edema. Encouraged elevating legs at night. She can also use compression stockings. Refill Lasix. Check electrolytes  in a week. I think the left ankle may be more related to her prior ankle fracture. There is not much pitting edema above the level of the ankle.  Her sodium is somewhat low. Will recheck. May need to stop Lasix if her sodium remains low. We'll also have to consider whether the hyponatremia is coming from a side effect from her other medications.  Aortic valve disease: She has a calcific density on the left coronary cusp. I reviewed her transthoracic echocardiogram. Prior to cardiac cath, would like to have a TEE to make sure there's nothing truly mobile on her aortic valve.  Signed:   Mina Marble, MD, [facility] 11/22/2013, 2:22 PM

## 2013-11-25 ENCOUNTER — Telehealth: Payer: Self-pay | Admitting: *Deleted

## 2013-11-25 NOTE — Telephone Encounter (Signed)
Called and spoke with Vaughan Basta and report I do not have this information but that the patient has been referred for a polysomnogram.  Once we have this information we will be able to provide him with the appropriate documentation for either CPAP or oxygen.

## 2013-11-25 NOTE — Telephone Encounter (Signed)
Received message from [place] needing last office notes regarding pt's O2 stats.  Pt insurance is requiring this information. [employer] provide pt with home oxygen.  Please give her a call at [phone] ext [phone] or fax [phone].  Derl Barrow, RN

## 2013-11-26 ENCOUNTER — Encounter (HOSPITAL_COMMUNITY): Payer: Self-pay | Admitting: Pharmacy Technician

## 2013-11-27 ENCOUNTER — Telehealth: Payer: Self-pay | Admitting: Sports Medicine

## 2013-11-27 NOTE — Telephone Encounter (Signed)
Amy from the pharmacy called because they are unclear on the directions on the medication IMDUR. It states take 0.5 tablets (15Mg ) daily. They are unsure if it means half tablet of a 30 mg or 0.5 mg and take three tabs. Please call Amy at [phone]. jw

## 2013-11-28 ENCOUNTER — Encounter (HOSPITAL_COMMUNITY)
Admission: RE | Disposition: A | Discharge status: Home / Self Care | Payer: Self-pay | Source: Ambulatory Visit | Attending: Cardiovascular Disease

## 2013-11-28 ENCOUNTER — Ambulatory Visit (HOSPITAL_COMMUNITY)
Admission: RE | Admit: 2013-11-28 | Discharge: 2013-11-28 | Disposition: A | Discharge status: Home / Self Care | Payer: BC Managed Care – PPO | Source: Ambulatory Visit | Attending: Cardiovascular Disease | Admitting: Cardiovascular Disease

## 2013-11-28 ENCOUNTER — Encounter (HOSPITAL_COMMUNITY): Payer: Self-pay | Admitting: Gastroenterology

## 2013-11-28 ENCOUNTER — Other Ambulatory Visit (INDEPENDENT_AMBULATORY_CARE_PROVIDER_SITE_OTHER): Payer: BC Managed Care – PPO

## 2013-11-28 DIAGNOSIS — Z7982 Long term (current) use of aspirin: Secondary | ICD-10-CM | POA: Diagnosis not present

## 2013-11-28 DIAGNOSIS — F319 Bipolar disorder, unspecified: Secondary | ICD-10-CM | POA: Insufficient documentation

## 2013-11-28 DIAGNOSIS — Z794 Long term (current) use of insulin: Secondary | ICD-10-CM | POA: Diagnosis not present

## 2013-11-28 DIAGNOSIS — I209 Angina pectoris, unspecified: Secondary | ICD-10-CM

## 2013-11-28 DIAGNOSIS — J4489 Other specified chronic obstructive pulmonary disease: Secondary | ICD-10-CM | POA: Insufficient documentation

## 2013-11-28 DIAGNOSIS — Z8673 Personal history of transient ischemic attack (TIA), and cerebral infarction without residual deficits: Secondary | ICD-10-CM | POA: Diagnosis not present

## 2013-11-28 DIAGNOSIS — Z79899 Other long term (current) drug therapy: Secondary | ICD-10-CM | POA: Insufficient documentation

## 2013-11-28 DIAGNOSIS — Z86718 Personal history of other venous thrombosis and embolism: Secondary | ICD-10-CM | POA: Diagnosis not present

## 2013-11-28 DIAGNOSIS — F17200 Nicotine dependence, unspecified, uncomplicated: Secondary | ICD-10-CM | POA: Diagnosis not present

## 2013-11-28 DIAGNOSIS — E119 Type 2 diabetes mellitus without complications: Secondary | ICD-10-CM | POA: Insufficient documentation

## 2013-11-28 DIAGNOSIS — R072 Precordial pain: Secondary | ICD-10-CM | POA: Diagnosis not present

## 2013-11-28 DIAGNOSIS — K219 Gastro-esophageal reflux disease without esophagitis: Secondary | ICD-10-CM | POA: Insufficient documentation

## 2013-11-28 DIAGNOSIS — E785 Hyperlipidemia, unspecified: Secondary | ICD-10-CM | POA: Insufficient documentation

## 2013-11-28 DIAGNOSIS — J449 Chronic obstructive pulmonary disease, unspecified: Secondary | ICD-10-CM | POA: Insufficient documentation

## 2013-11-28 DIAGNOSIS — I10 Essential (primary) hypertension: Secondary | ICD-10-CM | POA: Diagnosis not present

## 2013-11-28 DIAGNOSIS — I359 Nonrheumatic aortic valve disorder, unspecified: Secondary | ICD-10-CM | POA: Diagnosis not present

## 2013-11-28 HISTORY — PX: TEE WITHOUT CARDIOVERSION: SHX5443

## 2013-11-28 LAB — CBC WITH DIFFERENTIAL/PLATELET
Basophils Absolute: 0 10*3/uL (ref 0.0–0.1)
Basophils Relative: 0.4 % (ref 0.0–3.0)
Eosinophils Absolute: 0.3 10*3/uL (ref 0.0–0.7)
Eosinophils Relative: 2.7 % (ref 0.0–5.0)
HCT: 38.9 % (ref 36.0–46.0)
Hemoglobin: 13 g/dL (ref 12.0–15.0)
Lymphocytes Relative: 24.2 % (ref 12.0–46.0)
Lymphs Abs: 2.6 10*3/uL (ref 0.7–4.0)
MCHC: 33.3 g/dL (ref 30.0–36.0)
MCV: 88.7 fl (ref 78.0–100.0)
Monocytes Absolute: 0.7 10*3/uL (ref 0.1–1.0)
Monocytes Relative: 6.3 % (ref 3.0–12.0)
Neutro Abs: 7.1 10*3/uL (ref 1.4–7.7)
Neutrophils Relative %: 66.4 % (ref 43.0–77.0)
Platelets: 224 10*3/uL (ref 150.0–400.0)
RBC: 4.39 Mil/uL (ref 3.87–5.11)
RDW: 16 % — ABNORMAL HIGH (ref 11.5–15.5)
WBC: 10.7 10*3/uL — ABNORMAL HIGH (ref 4.0–10.5)

## 2013-11-28 LAB — BASIC METABOLIC PANEL
BUN: 18 mg/dL (ref 6–23)
CO2: 26 mEq/L (ref 19–32)
Calcium: 9.1 mg/dL (ref 8.4–10.5)
Chloride: 98 mEq/L (ref 96–112)
Creatinine, Ser: 0.9 mg/dL (ref 0.4–1.2)
GFR: 68.49 mL/min (ref >60.00)
Glucose, Bld: 356 mg/dL — ABNORMAL HIGH (ref 70–99)
Potassium: 4.5 mEq/L (ref 3.5–5.1)
Sodium: 132 mEq/L — ABNORMAL LOW (ref 135–145)

## 2013-11-28 LAB — PROTIME-INR
INR: 1 ratio (ref 0.8–1.0)
Prothrombin Time: 10.9 s (ref 9.6–13.1)

## 2013-11-28 LAB — GLUCOSE, CAPILLARY: Glucose-Capillary: 423 mg/dL — ABNORMAL HIGH (ref 70–99)

## 2013-11-28 SURGERY — ECHOCARDIOGRAM, TRANSESOPHAGEAL
Anesthesia: Moderate Sedation

## 2013-11-28 MED ORDER — SODIUM CHLORIDE 0.9 % IV SOLN: INTRAVENOUS | Status: DC

## 2013-11-28 MED ORDER — SODIUM CHLORIDE 0.9 % IV SOLN
INTRAVENOUS | Status: DC
  Administered 2013-11-28: 08:00:00 via INTRAVENOUS

## 2013-11-28 MED ORDER — DIPHENHYDRAMINE HCL 50 MG/ML IJ SOLN
INTRAMUSCULAR | Status: AC
  Filled 2013-11-28: qty 1

## 2013-11-28 MED ORDER — FENTANYL CITRATE 0.05 MG/ML IJ SOLN
INTRAMUSCULAR | Status: AC
  Filled 2013-11-28: qty 2

## 2013-11-28 MED ORDER — BUTAMBEN-TETRACAINE-BENZOCAINE 2-2-14 % EX AERO
INHALATION_SPRAY | CUTANEOUS | Status: DC | PRN
Start: 2013-11-28 — End: 2013-11-28
  Administered 2013-11-28: 2 via TOPICAL

## 2013-11-28 MED ORDER — MIDAZOLAM HCL 5 MG/ML IJ SOLN
INTRAMUSCULAR | Status: AC
  Filled 2013-11-28: qty 2

## 2013-11-28 MED ORDER — MIDAZOLAM HCL 10 MG/2ML IJ SOLN
INTRAMUSCULAR | Status: DC | PRN
  Administered 2013-11-28 (×4): 2 mg via INTRAVENOUS

## 2013-11-28 MED ORDER — FENTANYL CITRATE 0.05 MG/ML IJ SOLN
INTRAMUSCULAR | Status: DC | PRN
  Administered 2013-11-28: 50 ug via INTRAVENOUS
  Administered 2013-11-28: 25 ug via INTRAVENOUS

## 2013-11-28 NOTE — Discharge Instructions (Signed)
Transesophageal Echocardiography Transesophageal echocardiography (TEE) is a special type of test that produces images of the heart by using sound waves (echocardiogram). This type of echocardiography can obtain better images of the heart than standard echocardiography. TEE is done by passing a flexible tube down the esophagus. The heart is located in front of the esophagus. Because the heart and esophagus are close to one another, your health care provider can take very clear, detailed pictures of the heart via ultrasound waves. TEE may be done:  If your health care provider needs more information based on standard echocardiography findings.  If you had a stroke. This might have happened because a clot formed in your heart. TEE can visualize different areas of the heart and check for clots.  To check valve anatomy and function.  To check for infection on the inside of your heart (endocarditis).  To evaluate the dividing wall (septum) of the heart and presence of a hole that did not close after birth (patent foramen ovale or atrial septal defect).  To help diagnose a tear in the wall of the aorta (aortic dissection).  During cardiac valve surgery. This allows the surgeon to assess the valve repair before closing the chest.  During a variety of other cardiac procedures to guide positioning of catheters.  Sometimes before a cardioversion, which is a shock to convert heart rhythm back to normal. LET [facility] CARE PROVIDER KNOW ABOUT:   Any allergies you have.  All medicines you are taking, including vitamins, herbs, eye drops, creams, and over-the-counter medicines.  Previous problems you or members of your family have had with the use of anesthetics.  Any blood disorders you have.  Previous surgeries you have had.  Medical conditions you have.  Swallowing difficulties.  An esophageal obstruction. RISKS AND COMPLICATIONS  Generally, TEE is a safe procedure. However, as with any  procedure, complications can occur. Possible complications include an esophageal tear (rupture). BEFORE THE PROCEDURE   Do not eat or drink for 6 hours before the procedure or as directed by your health care provider.  Arrange for someone to drive you home after the procedure. Do not drive yourself home. During the procedure, you will be given medicines that can continue to make you feel drowsy and can impair your reflexes.  An IV access tube will be started in the arm. PROCEDURE   A medicine to help you relax (sedative) will be given through the IV access tube.  A medicine that numbs the area (local anesthetic) may be sprayed in the back of the throat.  Your blood pressure, heart rate, and breathing (vital signs) will be monitored during the procedure.  The TEE probe is a long, flexible tube. The tip of the probe is placed into the back of the mouth, and you will be asked to swallow. This helps to pass the tip of the probe into the esophagus. Once the tip of the probe is in the correct area, your health care provider can take pictures of the heart.  TEE is usually not a painful procedure. You may feel the probe press against the back of the throat. The probe does not enter the trachea and does not affect your breathing.  Your time spent at the hospital is usually less than 2 hours. AFTER THE PROCEDURE   You will be in bed, resting, until you have fully returned to consciousness.  When you first awaken, your throat may feel slightly sore and will probably still feel numb. This will  improve slowly over time.  You will not be allowed to eat or drink until it is clear that the numbness has improved.  Once you have been able to drink, urinate, and sit on the edge of the bed without feeling sick to your stomach (nausea) or dizzy, you may be cleared to go home.  You should have a friend or family member with you for the next 24 hours after your procedure. Document Released: 09/17/2002  Document Revised: 04/17/2013 Document Reviewed: 12/27/2012 [facility] Patient Information 2014 [place], [place].

## 2013-11-28 NOTE — H&P (View-Only) (Signed)
Patient ID: [name], female   DOB: 1971/03/09, 42 y.o.   MRN: [id number] Patient ID: [name], female   DOB: 19-Jun-1971, 43 y.o.   MRN: [id number] Patient ID: [name], female   DOB: 1971/02/09, 43 y.o.   MRN: [id number]     Patient ID: [name] MRN: [id number] DOB/AGE: [birth year] 43 y.o.   Referring Physician Dr. Paulla Fore   Reason for Consultation chest pain  HPI: 43 y/o who smokes.  SHe was hospitalized for chest pain.  She had  normal LV function by echocardiogram.   There was a calcific density on the aortic valve.  She has frequent chest tightness.  She treats it like heartburn.  There is relief with Alka Seltzer.  She takes prilosec.  Not related specifcally to exertion.  She does not think she would be able on walk on the treadmill.  She had a pharmologic stress test about 5 years ago.  She has leg weakness.    She had all of her teeth removed. She has had some dizziness. She denies syncope. She has not had diaphoresis. Her eating is limited due to her teeth. She does not report nausea.  She does say that she is not ready to quit smoking.    She feels that she is retaining fluid.  Her weight has increased.  She had a fracture of her left ankle.  She now has some swelling there.  She continues to have chest discomfort. She denies any fevers or chills.   Current Outpatient Prescriptions  Medication Sig Dispense Refill  . albuterol (PROVENTIL HFA;VENTOLIN HFA) 108 (90 BASE) MCG/ACT inhaler Inhale 2 puffs into the lungs every 4 (four) hours as needed. For shortness of breath  1 Inhaler  2  . aspirin EC 81 MG tablet Take 81 mg by mouth every morning.      Marland Kitchen atorvastatin (LIPITOR) 80 MG tablet Take 1 tablet (80 mg total) by mouth every evening. Replacement for Crestor  90 tablet  3  . clonazePAM (KLONOPIN) 1 MG tablet Take 0.5 tablets (0.5 mg total) by mouth 2 (two) times daily.      . clotrimazole (LOTRIMIN) 1 % cream Apply 1 application topically 2 (two) times daily.  Apply on the vulvar area  30 g  0  . colesevelam (WELCHOL) 625 MG tablet Take 1,250 mg by mouth 2 (two) times daily with a meal.      . FLUoxetine (PROZAC) 40 MG capsule Take 40 mg by mouth daily.       . Fluticasone-Salmeterol (ADVAIR DISKUS) 500-50 MCG/DOSE AEPB Inhale 1 puff into the lungs 2 (two) times daily.       . furosemide (LASIX) 40 MG tablet Take 1 tablet (40 mg total) by mouth daily.  30 tablet  6  . insulin regular human CONCENTRATED (HUMULIN R) 500 UNIT/ML SOLN injection Inject 24-30 Units into the skin 2 (two) times daily with a meal. Pt will inject 24 units every morning and 30 units in the evening ONLY IF blood sugar is above 115.      . isosorbide mononitrate (IMDUR) 15 mg TB24 24 hr tablet Take 0.5 tablets (15 mg total) by mouth daily.  30 tablet  1  . lamoTRIgine (LAMICTAL) 200 MG tablet Take 200 mg by mouth at bedtime.      Marland Kitchen lisinopril (PRINIVIL,ZESTRIL) 10 MG tablet Take 10 mg by mouth at bedtime.       . pravastatin (PRAVACHOL) 80 MG tablet Take 1 tablet (80 mg total) by  mouth daily.  90 tablet  3  . pregabalin (LYRICA) 150 MG capsule Take 1 capsule (150 mg total) by mouth 2 (two) times daily.  60 capsule  5  . QUEtiapine (SEROQUEL XR) 400 MG 24 hr tablet Take 800 mg by mouth at bedtime.      . traMADol (ULTRAM) 50 MG tablet Take 2 tablets (100 mg total) by mouth every 12 (twelve) hours as needed.  120 tablet  0   No current facility-administered medications for this visit.   Past Medical History  Diagnosis Date  . Asthma   . COPD (chronic obstructive pulmonary disease)   . Diabetes mellitus   . Renal disorder   . Hypertension   . Seizures   . Syncope   . Bipolar 1 disorder   . DVT (deep venous thrombosis)     2011- was on coumadin for a year  . Stroke     2010  . GERD (gastroesophageal reflux disease)   . Hyperlipidemia     Family History  Problem Relation Age of Onset  . Venous thrombosis Brother   . Asthma Father   . Diabetes Father   . Coronary artery  disease Mother   . Hypertension Mother   . Diabetes Mother   . Asthma Sister     History   Social History  . Marital Status: Married    Spouse Name: N/A    Number of Children: N/A  . Years of Education: N/A   Occupational History  . Not on file.   Social History Main Topics  . Smoking status: Current Every Day Smoker -- 0.50 packs/day for 20 years    Types: Cigarettes  . Smokeless tobacco: Not on file  . Alcohol Use: No  . Drug Use: No  . Sexual Activity: Yes   Other Topics Concern  . Not on file   Social History Narrative   Lives in [place]   Married but lives with Boyfriend - not legally separated   Disabled - for BiPolar, Seizure disorder, diabetes   Formerly worked at [employer]          Past Surgical History  Procedure Laterality Date  . Laparoscopy      Endometriosis      (Not in a hospital admission)  Review of systems complete and found to be negative unless listed above .  No nausea, vomiting.  No fever chills, No focal weakness,  No palpitations.  Physical Exam: Filed Vitals:   11/22/13 1400  BP: 122/64  Pulse: 98       Physical exam:  [place]/AT EOMI No JVD, No carotid bruit RRR S1S2  No wheezing Soft. NT, nondistended Left ankle edema. 2+ right radial pulse No focal motor or sensory deficits Normal affect  Labs:   Lab Results  Component Value Date   WBC 10.0 04/02/2013   HGB 13.4 04/02/2013   HCT 38.8 04/02/2013   MCV 86.8 04/02/2013   PLT 250 04/02/2013     Recent Labs Lab 11/21/13 0858  NA 129*  K 4.5  CL 95*  CO2 27  BUN 14  CREATININE 0.9  CALCIUM 9.2  GLUCOSE 360*   Lab Results  Component Value Date   CKTOTAL 90 10/08/2011   CKMB 1.7 10/08/2011   TROPONINI <0.30 04/02/2013    Lab Results  Component Value Date   CHOL 204* 02/04/2013   CHOL  Value: 172        ATP III CLASSIFICATION:  <200     mg/dL  Desirable  200-239  mg/dL   Borderline High  >=240    mg/dL   High        07/26/2008   CHOL  Value: 217        ATP III  CLASSIFICATION:  <200     mg/dL   Desirable  200-239  mg/dL   Borderline High  >=240    mg/dL   High* 09/02/2007   Lab Results  Component Value Date   HDL 34* 02/04/2013   HDL 24* 07/26/2008   HDL 32* 09/02/2007   Lab Results  Component Value Date   LDLCALC NOT CALC 02/04/2013   LDLCALC  Value: 78        Total Cholesterol/HDL:CHD Risk Coronary Heart Disease Risk Table                     Men   Women  1/2 Average Risk   3.4   3.3  Average Risk       5.0   4.4  2 X Average Risk   9.6   7.1  3 X Average Risk  23.4   11.0        Use the calculated Patient Ratio above and the CHD Risk Table to determine the patient's CHD Risk.        ATP III CLASSIFICATION (LDL):  <100     mg/dL   Optimal  100-129  mg/dL   Near or Above                    Optimal  130-159  mg/dL   Borderline  160-189  mg/dL   High  >190     mg/dL   Very High 07/26/2008   LDLCALC  Value: UNABLE TO CALCULATE IF TRIGLYCERIDE OVER 400 mg/dL        Total Cholesterol/HDL:CHD Risk Coronary Heart Disease Risk Table                     Men   Women  1/2 Average Risk   3.4   3.3 09/02/2007   Lab Results  Component Value Date   TRIG 658* 02/04/2013   TRIG 348* 07/26/2008   TRIG 462* 09/02/2007   Lab Results  Component Value Date   CHOLHDL 6.0 02/04/2013   CHOLHDL 7.2 07/26/2008   CHOLHDL 6.8 09/02/2007   Lab Results  Component Value Date   LDLDIRECT 145* 04/30/2009       EKG: NSR, prolonged QT, PRWP, NSST  ASSESSMENT AND PLAN:  Atypical chest pain: Several RF for CAD including smoking.  She doesn't think she can walk on the treadmill. pharmacologic stress test negative for ischemia.  Sx have persisted, with exertion as well.  She wears oxygen at night.  We discussed cardiac cath and she is agreeable.  The risks and benefits of the procedure were explained.  Did not tolerate Metoprolol 25 BID.  Did not tolerate SL NTG in the past due to headache.  If symptoms persist, we'll plan for cardiac cath. Of note she has some moderate carotid disease.   Start Ranexa 500 mg by mouth twice a day.  Smoking:  We spoke about the importance of stopping smoking.   Hypertriglyceridemia/hyperlipidemia: Her triglycerides have been over 600. Continue Crestor. She may benefit from fish oil.  Would start at 2 g by mouth twice a day.  Continue Lasix for edema. Encouraged elevating legs at night. She can also use compression stockings. Refill Lasix. Check electrolytes  in a week. I think the left ankle may be more related to her prior ankle fracture. There is not much pitting edema above the level of the ankle.  Her sodium is somewhat low. Will recheck. May need to stop Lasix if her sodium remains low. We'll also have to consider whether the hyponatremia is coming from a side effect from her other medications.  Aortic valve disease: She has a calcific density on the left coronary cusp. I reviewed her transthoracic echocardiogram. Prior to cardiac cath, would like to have a TEE to make sure there's nothing truly mobile on her aortic valve.  Signed:   Mina Marble, MD, [facility] 11/22/2013, 2:22 PM

## 2013-11-28 NOTE — Interval H&P Note (Signed)
History and Physical Interval Note:  11/28/2013 8:10 AM  [name]  has presented today for surgery, with the diagnosis of AORTIC VALVE DISORDER  The various methods of treatment have been discussed with the patient and family. After consideration of risks, benefits and other options for treatment, the patient has consented to  Procedure(s): TRANSESOPHAGEAL ECHOCARDIOGRAM (TEE) (N/A) as a surgical intervention .  The patient's history has been reviewed, patient examined, no change in status, stable for surgery.  I have reviewed the patient's chart and labs.  Questions were answered to the patient's satisfaction.     [name]

## 2013-11-28 NOTE — Progress Notes (Signed)
*  PRELIMINARY RESULTS* Echocardiogram Echocardiogram Transesophageal has been performed.  Elvia Collum 11/28/2013, 8:42 AM

## 2013-11-28 NOTE — CV Procedure (Signed)
    Transesophageal Echocardiogram Note  [name] [id number] January 16, 1971  Procedure: Transesophageal Echocardiogram Indications: aortic valve calcification  Procedure Details Consent: Obtained Time Out: Verified patient identification, verified procedure, site/side was marked, verified correct patient position, special equipment/implants available, Radiology Safety Procedures followed,  medications/allergies/relevent history reviewed, required imaging and test results available.  Performed  Medications: Fentanyl: 75 mcg iv  Versed:  8 mc iv  Left Ventrical:  Normal LV function  Mitral Valve: normal   Aortic Valve: the is a small area of calcification on the LCC.  No evidence of vegetatioin.  No AS, no AI  Tricuspid Valve: normal  Pulmonic Valve: normal  Left Atrium/ Left atrial appendage: normal, no thrombus  Atrial septum: ? Of small left to right  Flow by color.  No R to L flow by bubble study  Aorta: normal   Complications: No apparent complications Patient did tolerate procedure well.   Thayer Headings, Brooke Bonito., MD, [facility] 11/28/2013, 8:29 AM

## 2013-11-29 ENCOUNTER — Encounter (HOSPITAL_COMMUNITY): Payer: Self-pay | Admitting: Cardiovascular Disease

## 2013-11-29 NOTE — Telephone Encounter (Signed)
Called and spoke with Pharmacist.  Rx was supposed to be take 1/2 tab of 30mg  tab = 15mg .  The smallest tab made is 30mg  and this is what was selected.  It appears Rx was changed in Epic to reflect the total dose and not the actual pill size.

## 2013-12-04 ENCOUNTER — Other Ambulatory Visit: Payer: Self-pay | Admitting: Interventional Cardiology

## 2013-12-04 DIAGNOSIS — I209 Angina pectoris, unspecified: Secondary | ICD-10-CM

## 2013-12-05 ENCOUNTER — Ambulatory Visit (HOSPITAL_COMMUNITY)
Admission: RE | Admit: 2013-12-05 | Discharge: 2013-12-05 | Disposition: A | Discharge status: Home / Self Care | Payer: BC Managed Care – PPO | Source: Ambulatory Visit | Attending: Interventional Cardiology | Admitting: Interventional Cardiology

## 2013-12-05 ENCOUNTER — Encounter (HOSPITAL_COMMUNITY)
Admission: RE | Disposition: A | Discharge status: Home / Self Care | Payer: Self-pay | Source: Ambulatory Visit | Attending: Interventional Cardiology

## 2013-12-05 DIAGNOSIS — K219 Gastro-esophageal reflux disease without esophagitis: Secondary | ICD-10-CM | POA: Insufficient documentation

## 2013-12-05 DIAGNOSIS — J4489 Other specified chronic obstructive pulmonary disease: Secondary | ICD-10-CM | POA: Insufficient documentation

## 2013-12-05 DIAGNOSIS — Z86718 Personal history of other venous thrombosis and embolism: Secondary | ICD-10-CM | POA: Insufficient documentation

## 2013-12-05 DIAGNOSIS — Z794 Long term (current) use of insulin: Secondary | ICD-10-CM | POA: Diagnosis not present

## 2013-12-05 DIAGNOSIS — M7989 Other specified soft tissue disorders: Secondary | ICD-10-CM

## 2013-12-05 DIAGNOSIS — I2510 Atherosclerotic heart disease of native coronary artery without angina pectoris: Secondary | ICD-10-CM | POA: Diagnosis not present

## 2013-12-05 DIAGNOSIS — Z7982 Long term (current) use of aspirin: Secondary | ICD-10-CM | POA: Diagnosis not present

## 2013-12-05 DIAGNOSIS — F319 Bipolar disorder, unspecified: Secondary | ICD-10-CM | POA: Diagnosis not present

## 2013-12-05 DIAGNOSIS — Z8673 Personal history of transient ischemic attack (TIA), and cerebral infarction without residual deficits: Secondary | ICD-10-CM | POA: Diagnosis not present

## 2013-12-05 DIAGNOSIS — I10 Essential (primary) hypertension: Secondary | ICD-10-CM | POA: Diagnosis not present

## 2013-12-05 DIAGNOSIS — R569 Unspecified convulsions: Secondary | ICD-10-CM | POA: Diagnosis not present

## 2013-12-05 DIAGNOSIS — E785 Hyperlipidemia, unspecified: Secondary | ICD-10-CM | POA: Insufficient documentation

## 2013-12-05 DIAGNOSIS — F17200 Nicotine dependence, unspecified, uncomplicated: Secondary | ICD-10-CM | POA: Insufficient documentation

## 2013-12-05 DIAGNOSIS — E119 Type 2 diabetes mellitus without complications: Secondary | ICD-10-CM | POA: Diagnosis not present

## 2013-12-05 DIAGNOSIS — J449 Chronic obstructive pulmonary disease, unspecified: Secondary | ICD-10-CM | POA: Diagnosis not present

## 2013-12-05 DIAGNOSIS — I209 Angina pectoris, unspecified: Secondary | ICD-10-CM | POA: Diagnosis not present

## 2013-12-05 HISTORY — PX: LEFT HEART CATHETERIZATION WITH CORONARY ANGIOGRAM: SHX5451

## 2013-12-05 LAB — GLUCOSE, CAPILLARY
Glucose-Capillary: 273 mg/dL — ABNORMAL HIGH (ref 70–99)
Glucose-Capillary: 279 mg/dL — ABNORMAL HIGH (ref 70–99)

## 2013-12-05 SURGERY — LEFT HEART CATHETERIZATION WITH CORONARY ANGIOGRAM
Anesthesia: LOCAL

## 2013-12-05 MED ORDER — LIDOCAINE HCL (PF) 1 % IJ SOLN
INTRAMUSCULAR | Status: AC
  Filled 2013-12-05: qty 30

## 2013-12-05 MED ORDER — HEPARIN SODIUM (PORCINE) 1000 UNIT/ML IJ SOLN
INTRAMUSCULAR | Status: AC
  Filled 2013-12-05: qty 1

## 2013-12-05 MED ORDER — ASPIRIN 81 MG PO CHEW
81.0000 mg | CHEWABLE_TABLET | ORAL | Status: AC
  Administered 2013-12-05: 81 mg via ORAL
  Filled 2013-12-05: qty 1

## 2013-12-05 MED ORDER — VERAPAMIL HCL 2.5 MG/ML IV SOLN
INTRAVENOUS | Status: AC
  Filled 2013-12-05: qty 2

## 2013-12-05 MED ORDER — MIDAZOLAM HCL 2 MG/2ML IJ SOLN
INTRAMUSCULAR | Status: AC
  Filled 2013-12-05: qty 2

## 2013-12-05 MED ORDER — NITROGLYCERIN 0.2 MG/ML ON CALL CATH LAB
INTRAVENOUS | Status: AC
  Filled 2013-12-05: qty 1

## 2013-12-05 MED ORDER — FENTANYL CITRATE 0.05 MG/ML IJ SOLN
INTRAMUSCULAR | Status: AC
  Filled 2013-12-05: qty 2

## 2013-12-05 MED ORDER — SODIUM CHLORIDE 0.9 % IJ SOLN: 3.0000 mL | Freq: Two times a day (BID) | INTRAMUSCULAR | Status: DC

## 2013-12-05 MED ORDER — SODIUM CHLORIDE 0.9 % IV SOLN
250.0000 mL | INTRAVENOUS | Status: DC | PRN
Start: 2013-12-05 — End: 2013-12-05

## 2013-12-05 MED ORDER — SODIUM CHLORIDE 0.9 % IV SOLN
1.0000 mL/kg/h | INTRAVENOUS | Status: DC
  Administered 2013-12-05: 1 mL/kg/h via INTRAVENOUS

## 2013-12-05 MED ORDER — SODIUM CHLORIDE 0.9 % IJ SOLN: 3.0000 mL | INTRAMUSCULAR | Status: DC | PRN

## 2013-12-05 MED ORDER — SODIUM CHLORIDE 0.9 % IV SOLN: 1.0000 mL/kg/h | INTRAVENOUS | Status: DC

## 2013-12-05 MED ORDER — HEPARIN (PORCINE) IN NACL 2-0.9 UNIT/ML-% IJ SOLN
INTRAMUSCULAR | Status: AC
  Filled 2013-12-05: qty 1500

## 2013-12-05 MED ORDER — FUROSEMIDE 40 MG PO TABS: 40.0000 mg | ORAL_TABLET | Freq: Every day | ORAL | Status: DC

## 2013-12-05 NOTE — CV Procedure (Addendum)
       PROCEDURE:  Left heart catheterization with selective coronary angiography, left ventriculogram.  INDICATIONS:  Angina  The risks, benefits, and details of the procedure were explained to the patient.  The patient verbalized understanding and wanted to proceed.  Informed written consent was obtained.  PROCEDURE TECHNIQUE:  After Xylocaine anesthesia a 11F slender sheath was placed in the right radial artery with a single anterior needle wall stick.   Right coronary angiography was done using a Judkins R4 guide catheter.  Of note the LAO and RAO images were taken via fluoroscopy save, but did not save to the final study. Left coronary angiography was done using an EBU 3.0 guide catheter.  Left ventriculography was done using a pigtail catheter.  A TR band was used for hemostasis.  Several doses of intra-arterial nitroglycerin were given through the radial sheath to treat vasospasm.   CONTRAST:  Total of 80 cc.  COMPLICATIONS:  None.    HEMODYNAMICS:  Aortic pressure was 122/70; LV pressure was 125/3; LVEDP 9.  There was no gradient between the left ventricle and aorta.    ANGIOGRAPHIC DATA:   The left main coronary artery is widely patent.  The left anterior descending artery is a large vessel which wraps around the apex. In the proximal vessel, there is mild to moderate diffuse disease. This is eccentric and more significant intracranial views compared to the caudal views. The mid to distal LAD appears widely patent.  The left circumflex artery is a large vessel. There is a ramus vessel which is medium size and widely patent. The first significant obtuse marginal is a large vessel and branches across the lateral wall. This is widely patent. There is another obtuse marginal which is large and patent. There is a small left PDA which is patent.  The right coronary artery is a medium size codominant vessel.  In the mid vessel, there is mild to moderate diffuse plaque, up to 40%. No  hemodynamic significant disease. The posterior lateral artery a small. The posterior descending artery is medium size and widely patent.  LEFT VENTRICULOGRAM:  Left ventricular angiogram was done in the 30 RAO projection and revealed normal left ventricular wall motion and systolic function with an estimated ejection fraction of 60 %.  LVEDP was 9 mmHg.  IMPRESSIONS:  1. Normal left main coronary artery. 2. Mild disease in the proximal left anterior descending artery.  Patent branches. 3. Widely patent codominant left circumflex artery and its branches, without significant obstructive disease. 4. Mild to moderate disease in the mid right coronary artery. 5. Normal left ventricular systolic function.  LVEDP 9 mmHg.  Ejection fraction 60 %.  RECOMMENDATION:  She does have evidence of atherosclerosis. There does not appear to be any hemodynamically significant disease. She needs aggressive preventive therapy including smoking cessation and lipid lowering therapy.  Followup with Dr. Paulla Fore.

## 2013-12-05 NOTE — Discharge Instructions (Signed)
Follow post radial cath instructions. °Radial Site Care °Refer to this sheet in the next few weeks. These instructions provide you with information on caring for yourself after your procedure. Your caregiver may also give you more specific instructions. Your treatment has been planned according to current medical practices, but problems sometimes occur. Call your caregiver if you have any problems or questions after your procedure. °HOME CARE INSTRUCTIONS °· You may shower the day after the procedure. Remove the bandage (dressing) and gently wash the site with plain soap and water. Gently pat the site dry. °· Do not apply powder or lotion to the site. °· Do not submerge the affected site in water for 3 to 5 days. °· Inspect the site at least twice daily. °· Do not flex or bend the affected arm for 24 hours. °· No lifting over 5 pounds (2.3 kg) for 5 days after your procedure. °· Do not drive home if you are discharged the same day of the procedure. Have someone else drive you. °· You may drive 24 hours after the procedure unless otherwise instructed by your caregiver. °· Do not operate machinery or power tools for 24 hours. °· A responsible adult should be with you for the first 24 hours after you arrive home. °What to expect: °· Any bruising will usually fade within 1 to 2 weeks. °· Blood that collects in the tissue (hematoma) may be painful to the touch. It should usually decrease in size and tenderness within 1 to 2 weeks. °SEEK IMMEDIATE MEDICAL CARE IF: °· You have unusual pain at the radial site. °· You have redness, warmth, swelling, or pain at the radial site. °· You have drainage (other than a small amount of blood on the dressing). °· You have chills. °· You have a fever or persistent symptoms for more than 72 hours. °· You have a fever and your symptoms suddenly get worse. °· Your arm becomes pale, cool, tingly, or numb. °· You have heavy bleeding from the site. Hold pressure on the site. °Document  Released: 07/30/2010 Document Revised: 09/19/2011 Document Reviewed: 07/30/2010 °[employer]® Patient Information ©2014 [employer]. ° °

## 2013-12-05 NOTE — H&P (View-Only) (Signed)
Patient ID: [name], female   DOB: 1971/03/09, 43 y.o.   MRN: [id number] Patient ID: [name], female   DOB: 19-Jun-1971, 43 y.o.   MRN: [id number] Patient ID: [name], female   DOB: 1971/02/09, 43 y.o.   MRN: [id number]     Patient ID: [name] MRN: [id number] DOB/AGE: [birth year] 43 y.o.   Referring Physician Dr. Paulla Fore   Reason for Consultation chest pain  HPI: 43 y/o who smokes.  SHe was hospitalized for chest pain.  She had  normal LV function by echocardiogram.   There was a calcific density on the aortic valve.  She has frequent chest tightness.  She treats it like heartburn.  There is relief with Alka Seltzer.  She takes prilosec.  Not related specifcally to exertion.  She does not think she would be able on walk on the treadmill.  She had a pharmologic stress test about 5 years ago.  She has leg weakness.    She had all of her teeth removed. She has had some dizziness. She denies syncope. She has not had diaphoresis. Her eating is limited due to her teeth. She does not report nausea.  She does say that she is not ready to quit smoking.    She feels that she is retaining fluid.  Her weight has increased.  She had a fracture of her left ankle.  She now has some swelling there.  She continues to have chest discomfort. She denies any fevers or chills.   Current Outpatient Prescriptions  Medication Sig Dispense Refill  . albuterol (PROVENTIL HFA;VENTOLIN HFA) 108 (90 BASE) MCG/ACT inhaler Inhale 2 puffs into the lungs every 4 (four) hours as needed. For shortness of breath  1 Inhaler  2  . aspirin EC 81 MG tablet Take 81 mg by mouth every morning.      Marland Kitchen atorvastatin (LIPITOR) 80 MG tablet Take 1 tablet (80 mg total) by mouth every evening. Replacement for Crestor  90 tablet  3  . clonazePAM (KLONOPIN) 1 MG tablet Take 0.5 tablets (0.5 mg total) by mouth 2 (two) times daily.      . clotrimazole (LOTRIMIN) 1 % cream Apply 1 application topically 2 (two) times daily.  Apply on the vulvar area  30 g  0  . colesevelam (WELCHOL) 625 MG tablet Take 1,250 mg by mouth 2 (two) times daily with a meal.      . FLUoxetine (PROZAC) 40 MG capsule Take 40 mg by mouth daily.       . Fluticasone-Salmeterol (ADVAIR DISKUS) 500-50 MCG/DOSE AEPB Inhale 1 puff into the lungs 2 (two) times daily.       . furosemide (LASIX) 40 MG tablet Take 1 tablet (40 mg total) by mouth daily.  30 tablet  6  . insulin regular human CONCENTRATED (HUMULIN R) 500 UNIT/ML SOLN injection Inject 24-30 Units into the skin 2 (two) times daily with a meal. Pt will inject 24 units every morning and 30 units in the evening ONLY IF blood sugar is above 115.      . isosorbide mononitrate (IMDUR) 15 mg TB24 24 hr tablet Take 0.5 tablets (15 mg total) by mouth daily.  30 tablet  1  . lamoTRIgine (LAMICTAL) 200 MG tablet Take 200 mg by mouth at bedtime.      Marland Kitchen lisinopril (PRINIVIL,ZESTRIL) 10 MG tablet Take 10 mg by mouth at bedtime.       . pravastatin (PRAVACHOL) 80 MG tablet Take 1 tablet (80 mg total) by  mouth daily.  90 tablet  3  . pregabalin (LYRICA) 150 MG capsule Take 1 capsule (150 mg total) by mouth 2 (two) times daily.  60 capsule  5  . QUEtiapine (SEROQUEL XR) 400 MG 24 hr tablet Take 800 mg by mouth at bedtime.      . traMADol (ULTRAM) 50 MG tablet Take 2 tablets (100 mg total) by mouth every 12 (twelve) hours as needed.  120 tablet  0   No current facility-administered medications for this visit.   Past Medical History  Diagnosis Date  . Asthma   . COPD (chronic obstructive pulmonary disease)   . Diabetes mellitus   . Renal disorder   . Hypertension   . Seizures   . Syncope   . Bipolar 1 disorder   . DVT (deep venous thrombosis)     2011- was on coumadin for a year  . Stroke     2010  . GERD (gastroesophageal reflux disease)   . Hyperlipidemia     Family History  Problem Relation Age of Onset  . Venous thrombosis Brother   . Asthma Father   . Diabetes Father   . Coronary artery  disease Mother   . Hypertension Mother   . Diabetes Mother   . Asthma Sister     History   Social History  . Marital Status: Married    Spouse Name: N/A    Number of Children: N/A  . Years of Education: N/A   Occupational History  . Not on file.   Social History Main Topics  . Smoking status: Current Every Day Smoker -- 0.50 packs/day for 20 years    Types: Cigarettes  . Smokeless tobacco: Not on file  . Alcohol Use: No  . Drug Use: No  . Sexual Activity: Yes   Other Topics Concern  . Not on file   Social History Narrative   Lives in [place]   Married but lives with Boyfriend - not legally separated   Disabled - for BiPolar, Seizure disorder, diabetes   Formerly worked at [employer]          Past Surgical History  Procedure Laterality Date  . Laparoscopy      Endometriosis      (Not in a hospital admission)  Review of systems complete and found to be negative unless listed above .  No nausea, vomiting.  No fever chills, No focal weakness,  No palpitations.  Physical Exam: Filed Vitals:   11/22/13 1400  BP: 122/64  Pulse: 98       Physical exam:  [place]/AT EOMI No JVD, No carotid bruit RRR S1S2  No wheezing Soft. NT, nondistended Left ankle edema. 2+ right radial pulse No focal motor or sensory deficits Normal affect  Labs:   Lab Results  Component Value Date   WBC 10.0 04/02/2013   HGB 13.4 04/02/2013   HCT 38.8 04/02/2013   MCV 86.8 04/02/2013   PLT 250 04/02/2013     Recent Labs Lab 11/21/13 0858  NA 129*  K 4.5  CL 95*  CO2 27  BUN 14  CREATININE 0.9  CALCIUM 9.2  GLUCOSE 360*   Lab Results  Component Value Date   CKTOTAL 90 10/08/2011   CKMB 1.7 10/08/2011   TROPONINI <0.30 04/02/2013    Lab Results  Component Value Date   CHOL 204* 02/04/2013   CHOL  Value: 172        ATP III CLASSIFICATION:  <200     mg/dL  Desirable  200-239  mg/dL   Borderline High  >=240    mg/dL   High        07/26/2008   CHOL  Value: 217        ATP III  CLASSIFICATION:  <200     mg/dL   Desirable  200-239  mg/dL   Borderline High  >=240    mg/dL   High* 09/02/2007   Lab Results  Component Value Date   HDL 34* 02/04/2013   HDL 24* 07/26/2008   HDL 32* 09/02/2007   Lab Results  Component Value Date   LDLCALC NOT CALC 02/04/2013   LDLCALC  Value: 78        Total Cholesterol/HDL:CHD Risk Coronary Heart Disease Risk Table                     Men   Women  1/2 Average Risk   3.4   3.3  Average Risk       5.0   4.4  2 X Average Risk   9.6   7.1  3 X Average Risk  23.4   11.0        Use the calculated Patient Ratio above and the CHD Risk Table to determine the patient's CHD Risk.        ATP III CLASSIFICATION (LDL):  <100     mg/dL   Optimal  100-129  mg/dL   Near or Above                    Optimal  130-159  mg/dL   Borderline  160-189  mg/dL   High  >190     mg/dL   Very High 07/26/2008   LDLCALC  Value: UNABLE TO CALCULATE IF TRIGLYCERIDE OVER 400 mg/dL        Total Cholesterol/HDL:CHD Risk Coronary Heart Disease Risk Table                     Men   Women  1/2 Average Risk   3.4   3.3 09/02/2007   Lab Results  Component Value Date   TRIG 658* 02/04/2013   TRIG 348* 07/26/2008   TRIG 462* 09/02/2007   Lab Results  Component Value Date   CHOLHDL 6.0 02/04/2013   CHOLHDL 7.2 07/26/2008   CHOLHDL 6.8 09/02/2007   Lab Results  Component Value Date   LDLDIRECT 145* 04/30/2009       EKG: NSR, prolonged QT, PRWP, NSST  ASSESSMENT AND PLAN:  Atypical chest pain: Several RF for CAD including smoking.  She doesn't think she can walk on the treadmill. pharmacologic stress test negative for ischemia.  Sx have persisted, with exertion as well.  She wears oxygen at night.  We discussed cardiac cath and she is agreeable.  The risks and benefits of the procedure were explained.  Did not tolerate Metoprolol 25 BID.  Did not tolerate SL NTG in the past due to headache.  If symptoms persist, we'll plan for cardiac cath. Of note she has some moderate carotid disease.   Start Ranexa 500 mg by mouth twice a day.  Smoking:  We spoke about the importance of stopping smoking.   Hypertriglyceridemia/hyperlipidemia: Her triglycerides have been over 600. Continue Crestor. She may benefit from fish oil.  Would start at 2 g by mouth twice a day.  Continue Lasix for edema. Encouraged elevating legs at night. She can also use compression stockings. Refill Lasix. Check electrolytes  in a week. I think the left ankle may be more related to her prior ankle fracture. There is not much pitting edema above the level of the ankle.  Her sodium is somewhat low. Will recheck. May need to stop Lasix if her sodium remains low. We'll also have to consider whether the hyponatremia is coming from a side effect from her other medications.  Aortic valve disease: She has a calcific density on the left coronary cusp. I reviewed her transthoracic echocardiogram. Prior to cardiac cath, would like to have a TEE to make sure there's nothing truly mobile on her aortic valve.  Signed:   Mina Marble, MD, [facility] 11/22/2013, 2:22 PM

## 2013-12-05 NOTE — Interval H&P Note (Signed)
Cath Lab Visit (complete for each Cath Lab visit)  Clinical Evaluation Leading to the Procedure:   ACS: no  Non-ACS:    Anginal Classification: CCS III  Anti-ischemic medical therapy: Maximal Therapy (2 or more classes of medications)  Non-Invasive Test Results: No non-invasive testing performed  Prior CABG: No previous CABG      History and Physical Interval Note:  12/05/2013 9:28 AM  [name]  has presented today for surgery, with the diagnosis of cp  The various methods of treatment have been discussed with the patient and family. After consideration of risks, benefits and other options for treatment, the patient has consented to  Procedure(s): LEFT HEART CATHETERIZATION WITH CORONARY ANGIOGRAM (N/A) as a surgical intervention .  The patient's history has been reviewed, patient examined, no change in status, stable for surgery.  I have reviewed the patient's chart and labs.  Questions were answered to the patient's satisfaction.     Jettie Booze

## 2013-12-09 ENCOUNTER — Other Ambulatory Visit: Payer: Self-pay | Admitting: *Deleted

## 2013-12-11 NOTE — Telephone Encounter (Signed)
Pt called and needs a refill on her Tramadol left up front for pick up. Please call when ready. jw °

## 2013-12-11 NOTE — Telephone Encounter (Signed)
Pt needs an appointment.  This is a controlled medication and needs to be dispensed in an office visit.  Also need to discuss if appropriate to continue use in the setting of her seizures.

## 2013-12-12 NOTE — Telephone Encounter (Signed)
Spoke with patient and informed her she will need to come in. I offered her an appointment today, she will call me back if she can get a ride

## 2013-12-13 NOTE — Telephone Encounter (Signed)
appointment with Dr. Paulla Fore 6/15

## 2013-12-23 ENCOUNTER — Ambulatory Visit: Payer: BC Managed Care – PPO | Admitting: Sports Medicine

## 2013-12-24 ENCOUNTER — Ambulatory Visit: Payer: BC Managed Care – PPO | Admitting: Sports Medicine

## 2013-12-30 ENCOUNTER — Ambulatory Visit (INDEPENDENT_AMBULATORY_CARE_PROVIDER_SITE_OTHER): Payer: BC Managed Care – PPO | Admitting: Sports Medicine

## 2013-12-30 DIAGNOSIS — I209 Angina pectoris, unspecified: Secondary | ICD-10-CM

## 2013-12-30 DIAGNOSIS — IMO0001 Reserved for inherently not codable concepts without codable children: Secondary | ICD-10-CM | POA: Diagnosis not present

## 2013-12-30 DIAGNOSIS — R0789 Other chest pain: Secondary | ICD-10-CM

## 2013-12-30 DIAGNOSIS — E1165 Type 2 diabetes mellitus with hyperglycemia: Secondary | ICD-10-CM

## 2013-12-30 DIAGNOSIS — IMO0002 Reserved for concepts with insufficient information to code with codable children: Secondary | ICD-10-CM

## 2013-12-30 DIAGNOSIS — G40909 Epilepsy, unspecified, not intractable, without status epilepticus: Secondary | ICD-10-CM

## 2013-12-30 DIAGNOSIS — J45909 Unspecified asthma, uncomplicated: Secondary | ICD-10-CM

## 2013-12-30 DIAGNOSIS — G4730 Sleep apnea, unspecified: Secondary | ICD-10-CM

## 2013-12-30 DIAGNOSIS — F17200 Nicotine dependence, unspecified, uncomplicated: Secondary | ICD-10-CM

## 2013-12-30 LAB — POCT GLYCOSYLATED HEMOGLOBIN (HGB A1C): Hemoglobin A1C: 9.6

## 2013-12-30 IMAGING — CT CT HEAD W/O CM
1 series · 16 of 30 positions shown, 20 images · non-contrast
Comparison: Head CT 10/08/2011.

CLINICAL DATA: Headache with chest pain. Seizures with multiple
falls.

EXAM:
CT HEAD WITHOUT CONTRAST
TECHNIQUE: Contiguous axial images were obtained from the base of the skull
through the vertex without intravenous contrast.

[Series 2: head 5.0 h30s · axial · 0.43mm/px · z∈[-63,+77]mm · 16 of 32 slices shown, 20 images]
[im 2/32  brain]
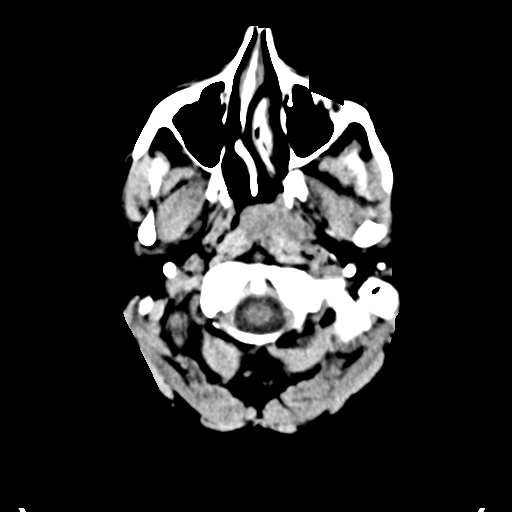
[im 2/32  bone]
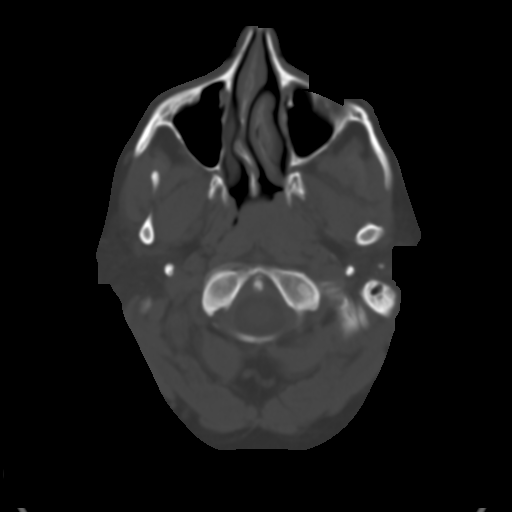
[im 4/32  brain]
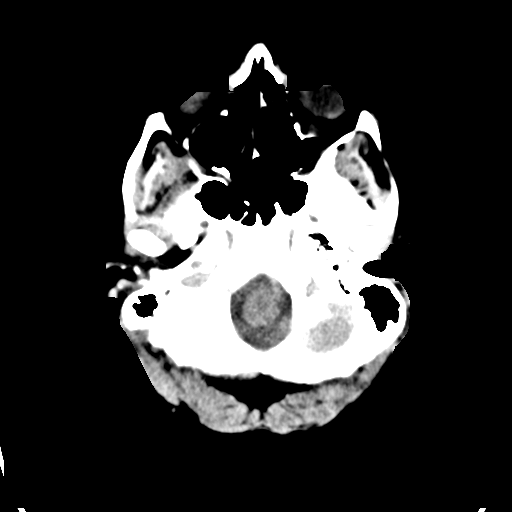
[im 6/32  brain]
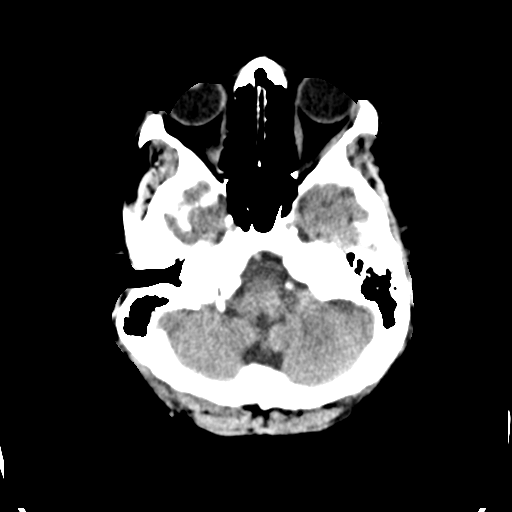
[im 8/32  brain]
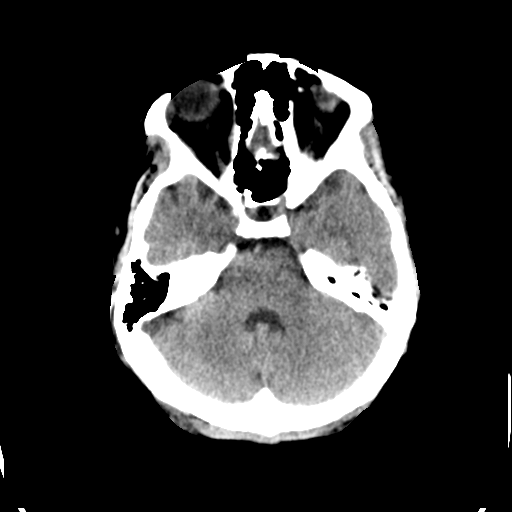
[im 9/32  brain]
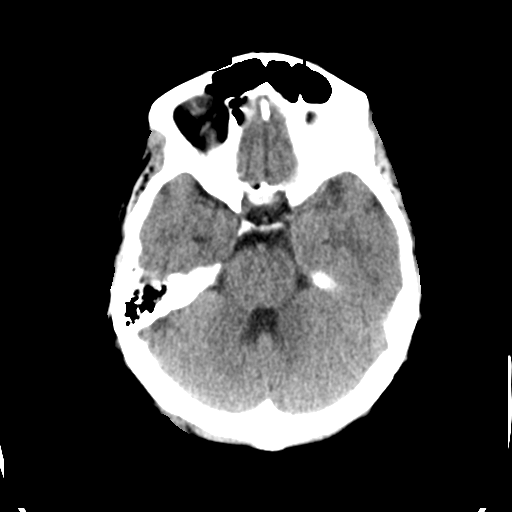
[im 9/32  bone]
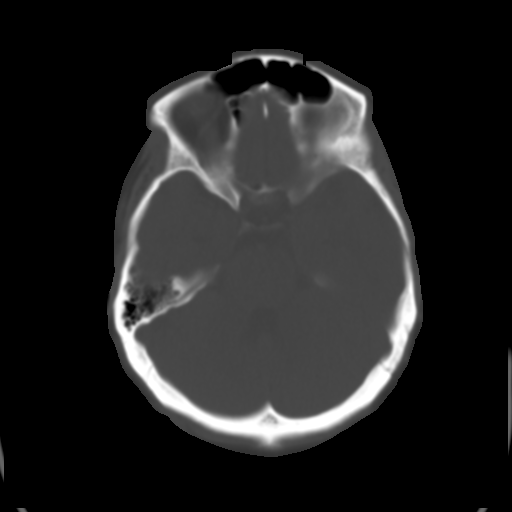
[im 11/32  brain]
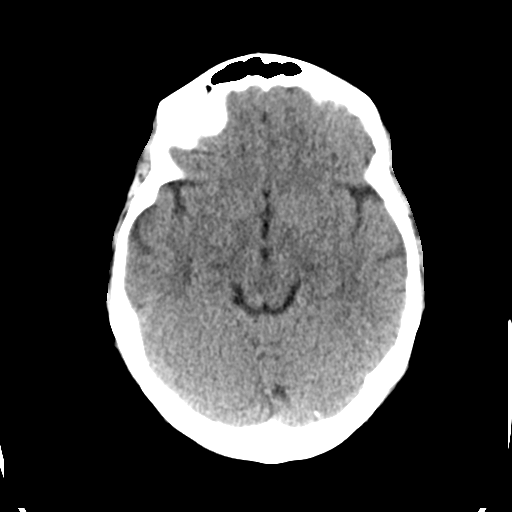
[im 13/32  brain]
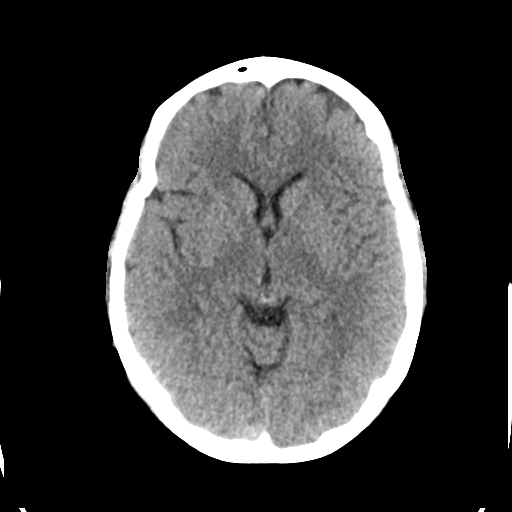
[im 15/32  brain]
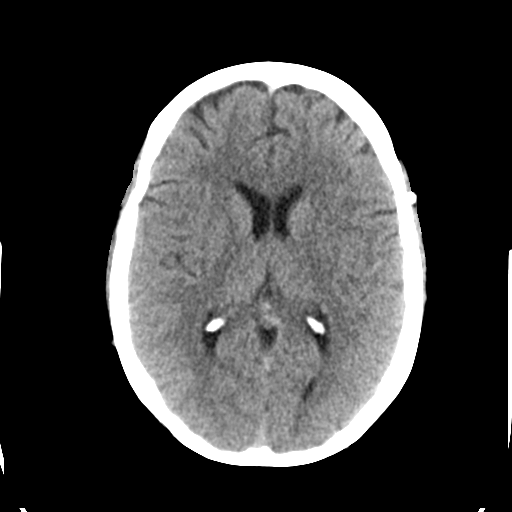
[im 17/32  brain]
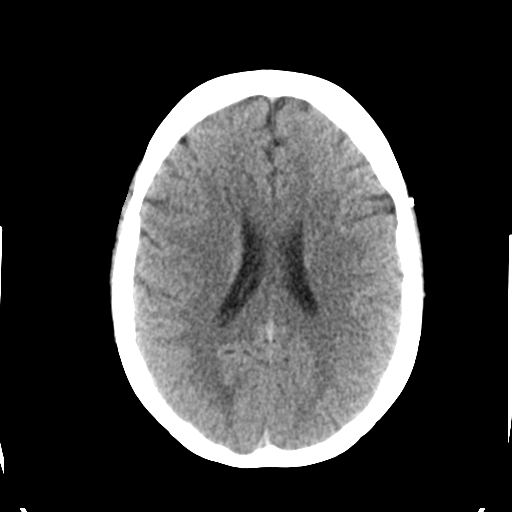
[im 17/32  bone]
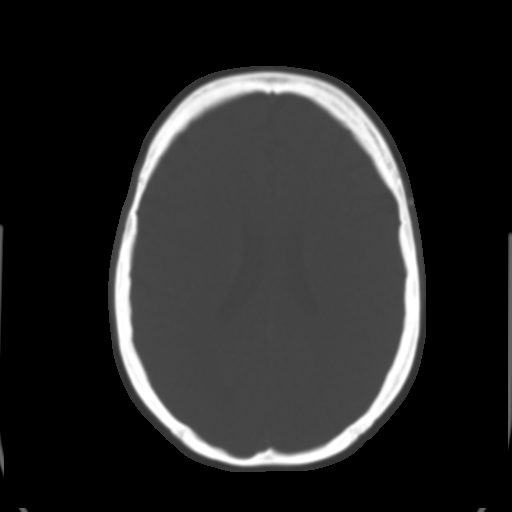
[im 19/32  brain]
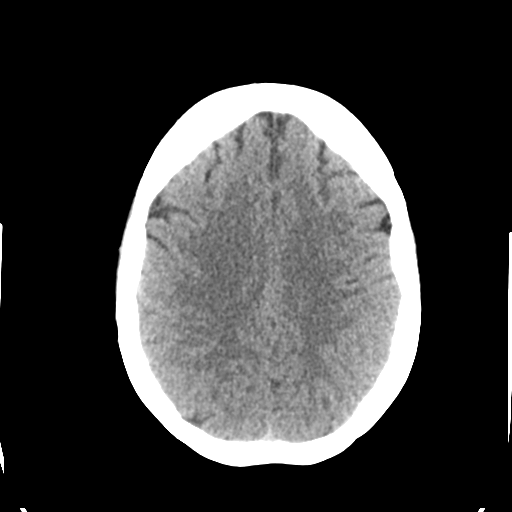
[im 21/32  brain]
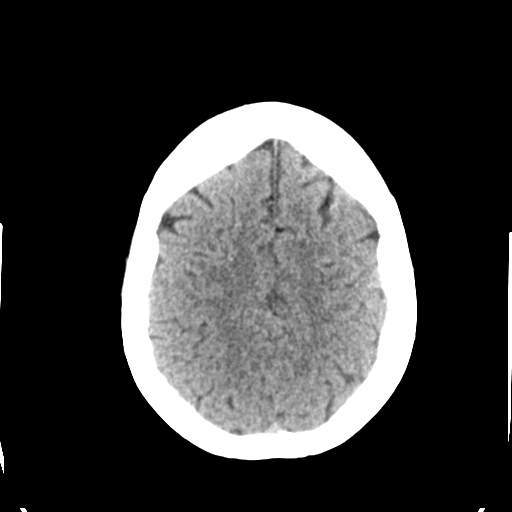
[im 23/32  brain]
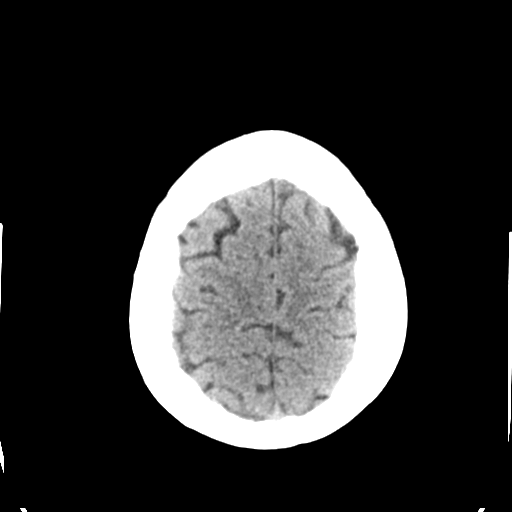
[im 24/32  brain]
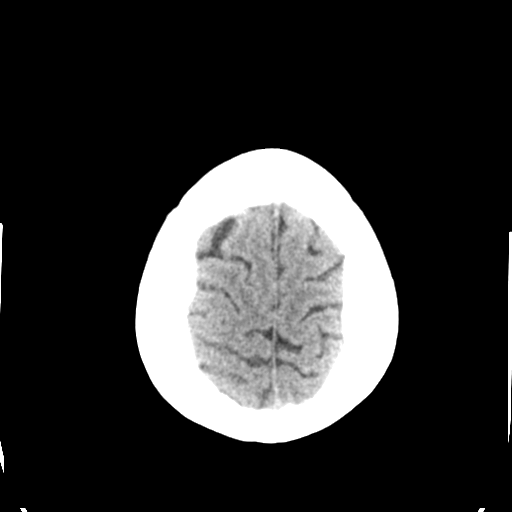
[im 24/32  bone]
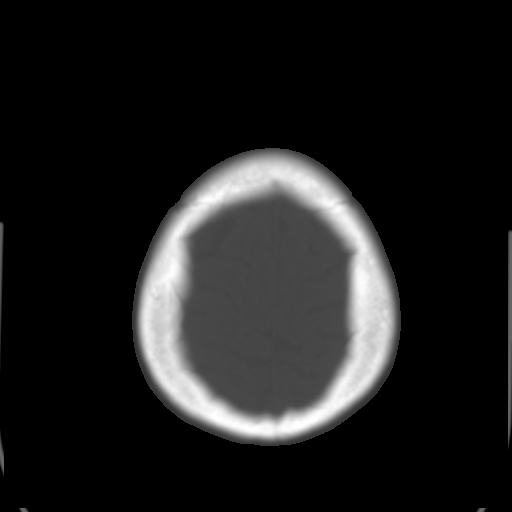
[im 26/32  brain]
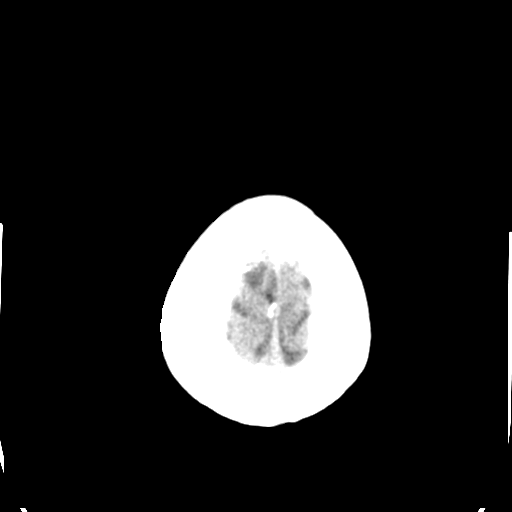
[im 28/32  brain]
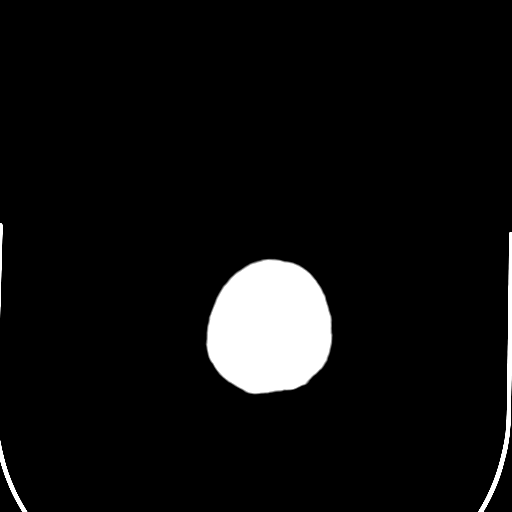
[im 30/32  brain]
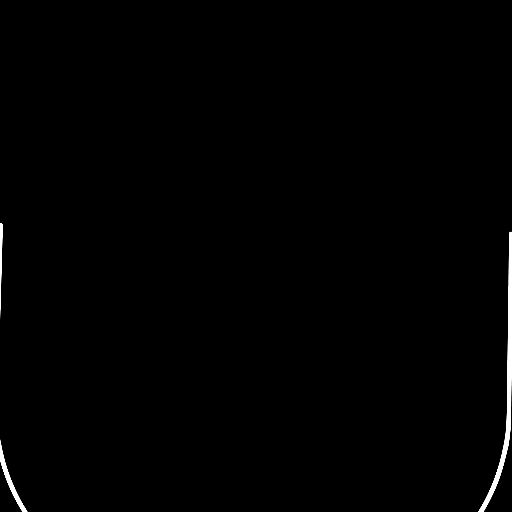

[16 of 30 positions shown; findings below may reference images not displayed]

FINDINGS: There is no evidence of acute intracranial hemorrhage, mass lesion,
brain edema or extra-axial fluid collection. The ventricles and
subarachnoid spaces are appropriately sized for age. There is no CT
evidence of acute cortical infarction.

The visualized paranasal sinuses, mastoid air cells and middle ears
are clear. Metallic foreign body within the left frontal scalp is
again noted. The calvarium is intact.
IMPRESSION: Stable examination. No acute intracranial or findings.

## 2013-12-30 MED ORDER — TRAMADOL HCL 50 MG PO TABS: 100.0000 mg | ORAL_TABLET | Freq: Two times a day (BID) | ORAL | Status: DC | PRN

## 2013-12-30 NOTE — Progress Notes (Signed)
  [name] - 43 y.o. female MRN [id number]  Date of birth: 1971-03-23  SUBJECTIVE:  Including CC & ROS.   Patient is here to followup on her chronic medical problems.  Chest pain & Chronic Pain Syndrome: Extensive cardiac evaluation reassuring.  Continued to have some intermittent left-sided chest pain.  No exertional component.  Relieved with tramadol.  Tobacco Abuse: Continues to smoke, interested in trying to quit   Disordered Sleep: Still having significantly disordered sleep including snoring.  Has not heard then scheduled for a polysomnogram.  Diabetes and hypertension: Has upcoming appointment with Dr. Buddy Duty.  Has minimal activity throughout the day.   HISTORY: Past Medical, Surgical, Social, and Family History Reviewed & Updated per EMR. Pertinent Historical Findings include: Vivien's major active medical problems include: # CV Disease: hx of TIA, Sinus Tachycardia, HTN, HLD, DM, tobacco abuse, OSA, poor dentention - poorly controlled chronic diseases - Hospitalized in October 2014 for fatigue, areflexia, generalized weakness and chest pain  - Carotid ultrasound equals 1-39% carotid stenosis  - Echo = EF 55-65%, echodensity on aortic valve, TEE recommended # Uncontrolled DM: with peripheral neuropathy  Managed by Endocrinology - Buddy Duty  Most recent A1c (outside lab) 01/23/13 = 7.7 # Neuropsych: Seizure Disorder, Bipolar Disorder, Anxiety  Followed by Dr. Candis Schatz # RESP: COPD, Asthma, Sleep Apnea,  Other Pertinent Med/Surg/Hosp History: # History of laparoscopy for endometriosis   Follow up Issues:  Smoking   DATA REVIEWED: Cardiology evaluation including cardiac catheterization results.  PHYSICAL EXAM:  PHYSICAL EXAM: GENERAL:  Adult obese Caucasian female. In no discomfort; no respiratory distress  PSYCH:  alert and appropriate, good insight  HNEENT:  mmm, no JVD CARDIAC:  RRR, S1/S2 heard, no murmur LUNGS:  CTA B, no wheezes, no crackles EXTREM:  Warm, well  perfused.  Moves all 4 extremities spontaneously; no lateralization.  Pedal pulses 1/4.  Trace pretibial edema.  ASSESSMENT & PLAN: See problem based charting & AVS for pt instructions.

## 2013-12-30 NOTE — Patient Instructions (Addendum)
Please call if not heard from [facility]  Refill on Tramadol including 1 additional Refill.  Follow up with Dr. Buddy Duty and  Discuss with Dr. Candis Schatz about changes to help with you anxiety and consider chantix again for your smoking.Marland Kitchen  Here are some basic exercise recommendations to remember:  Try to be active every day and throughout the day.    We have actually found that being active throughout the day is likely more important than getting to the gym 5 days per week.  Minimizing being in active should be an important health goal we all are working towards.  A basic starting point can be limiting the time that you sit or lay still during the day.  You should sit/lay/lounge for no longer than 20-30 minutes at a time if you are able.  Even interrupting sitting with standing/jumping jacks/dancing/etc for one minute can have significant health benefits.   Try to remember: "Why sit when you can stand, why stand when you can walk, why walk when you can run."  The point he is to look for opportunities during the day where you can increase your heart rate.    Ideally, I recommend you exercise for at least 30 minutes per day, 5 days per week.  This would involve any activity that will elevate your heart rate to the point that you have a hard time carrying on a normal conversation, but not to the point of being completely out of breath.  There are alternative options however this is a generally good goal to strive for.  You can adjust your intensity based on heart rate (HR).  To calculate your target HR take 220 minus your age, then multiply X 0.7 (70%).  Example for 43 year old:  220 - 12 = 180;  180 X 0.7 = 126  Try to keep your HR within 10 beats of this target throughout your exercise   I am always happy to talk more about "Exercise as medicine" if you are interested"

## 2013-12-31 IMAGING — CR DG CHEST 2V
2 series · 2 of 2 positions shown · non-contrast
Comparison: None.

EXAM:
CHEST  2 VIEW

[w chest pa]
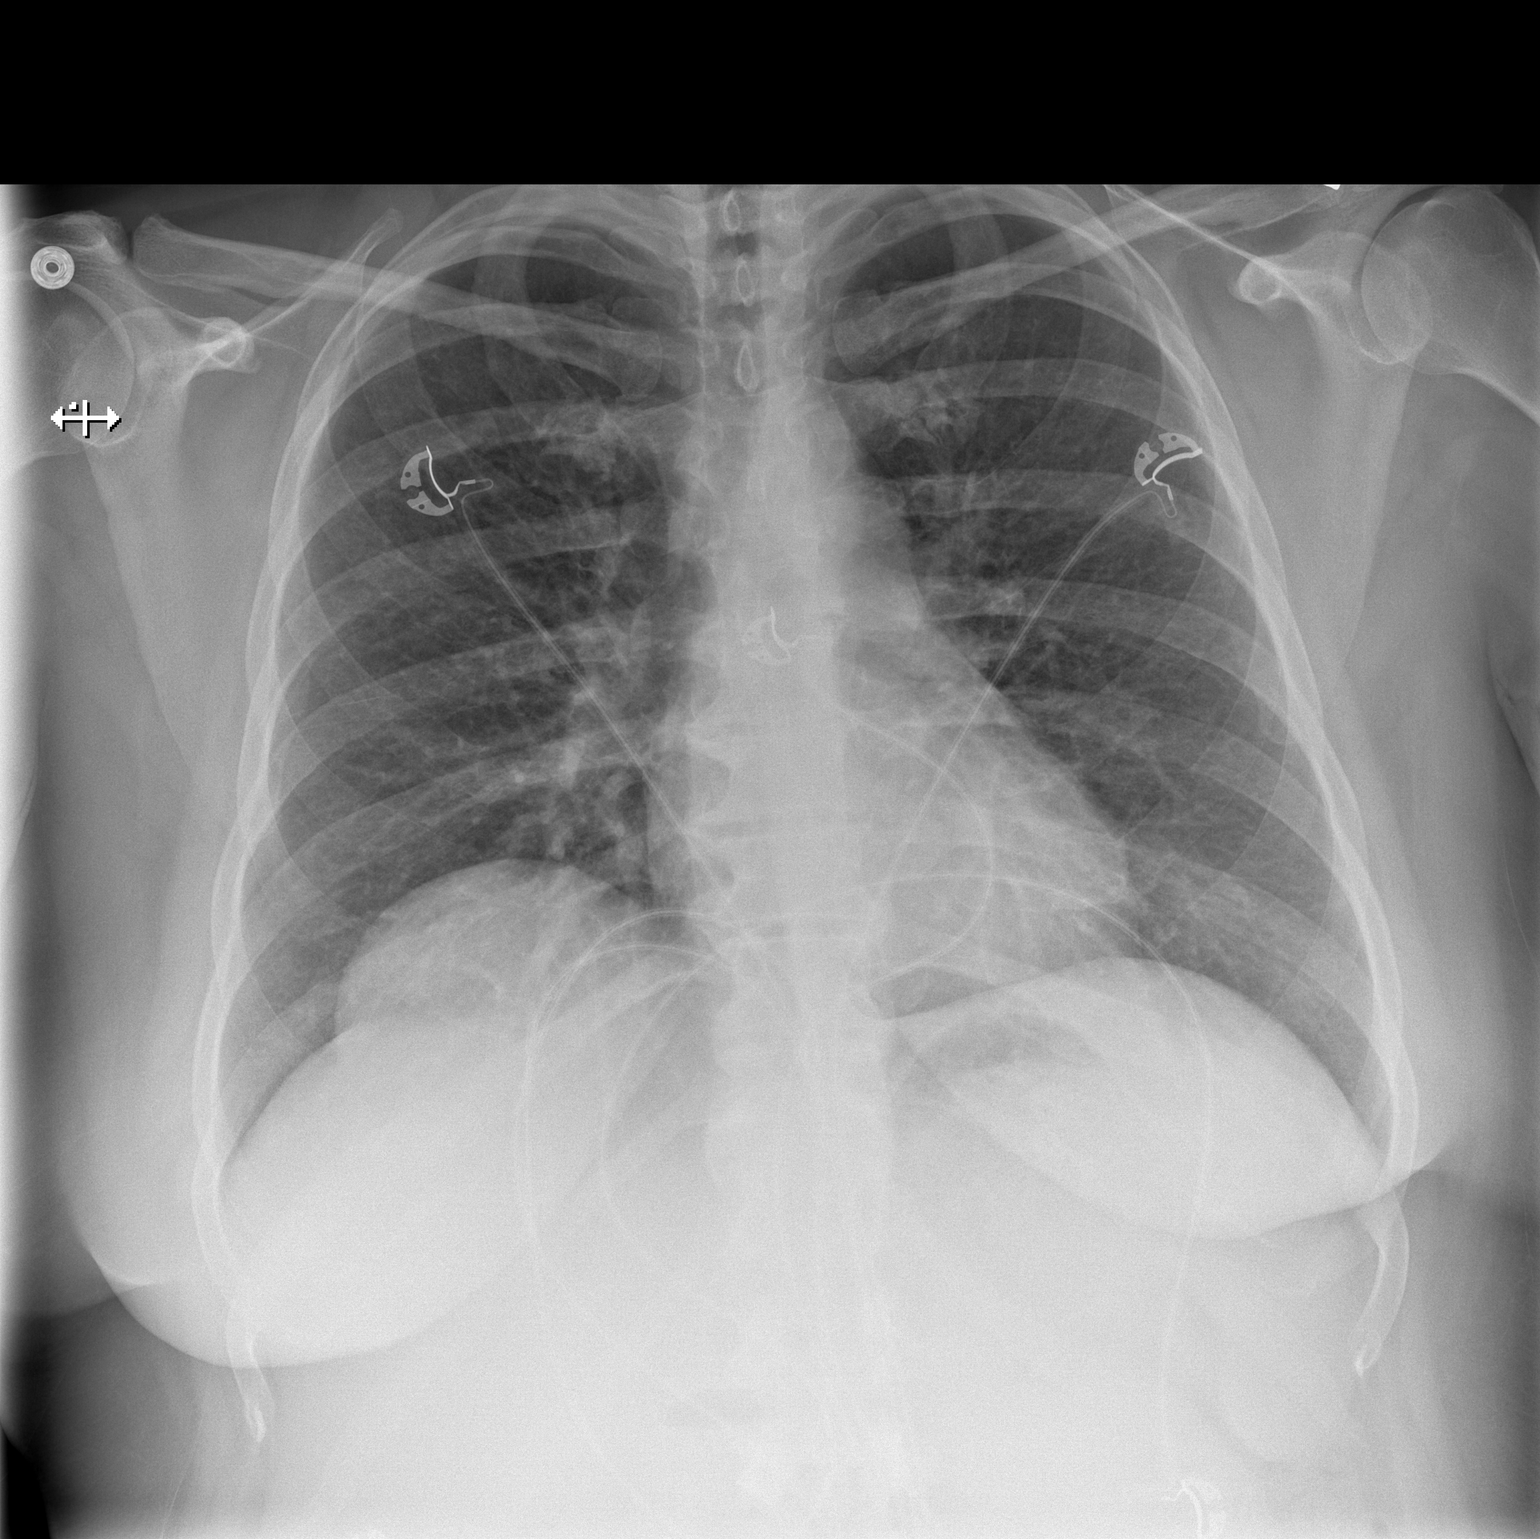

[w chest lat]
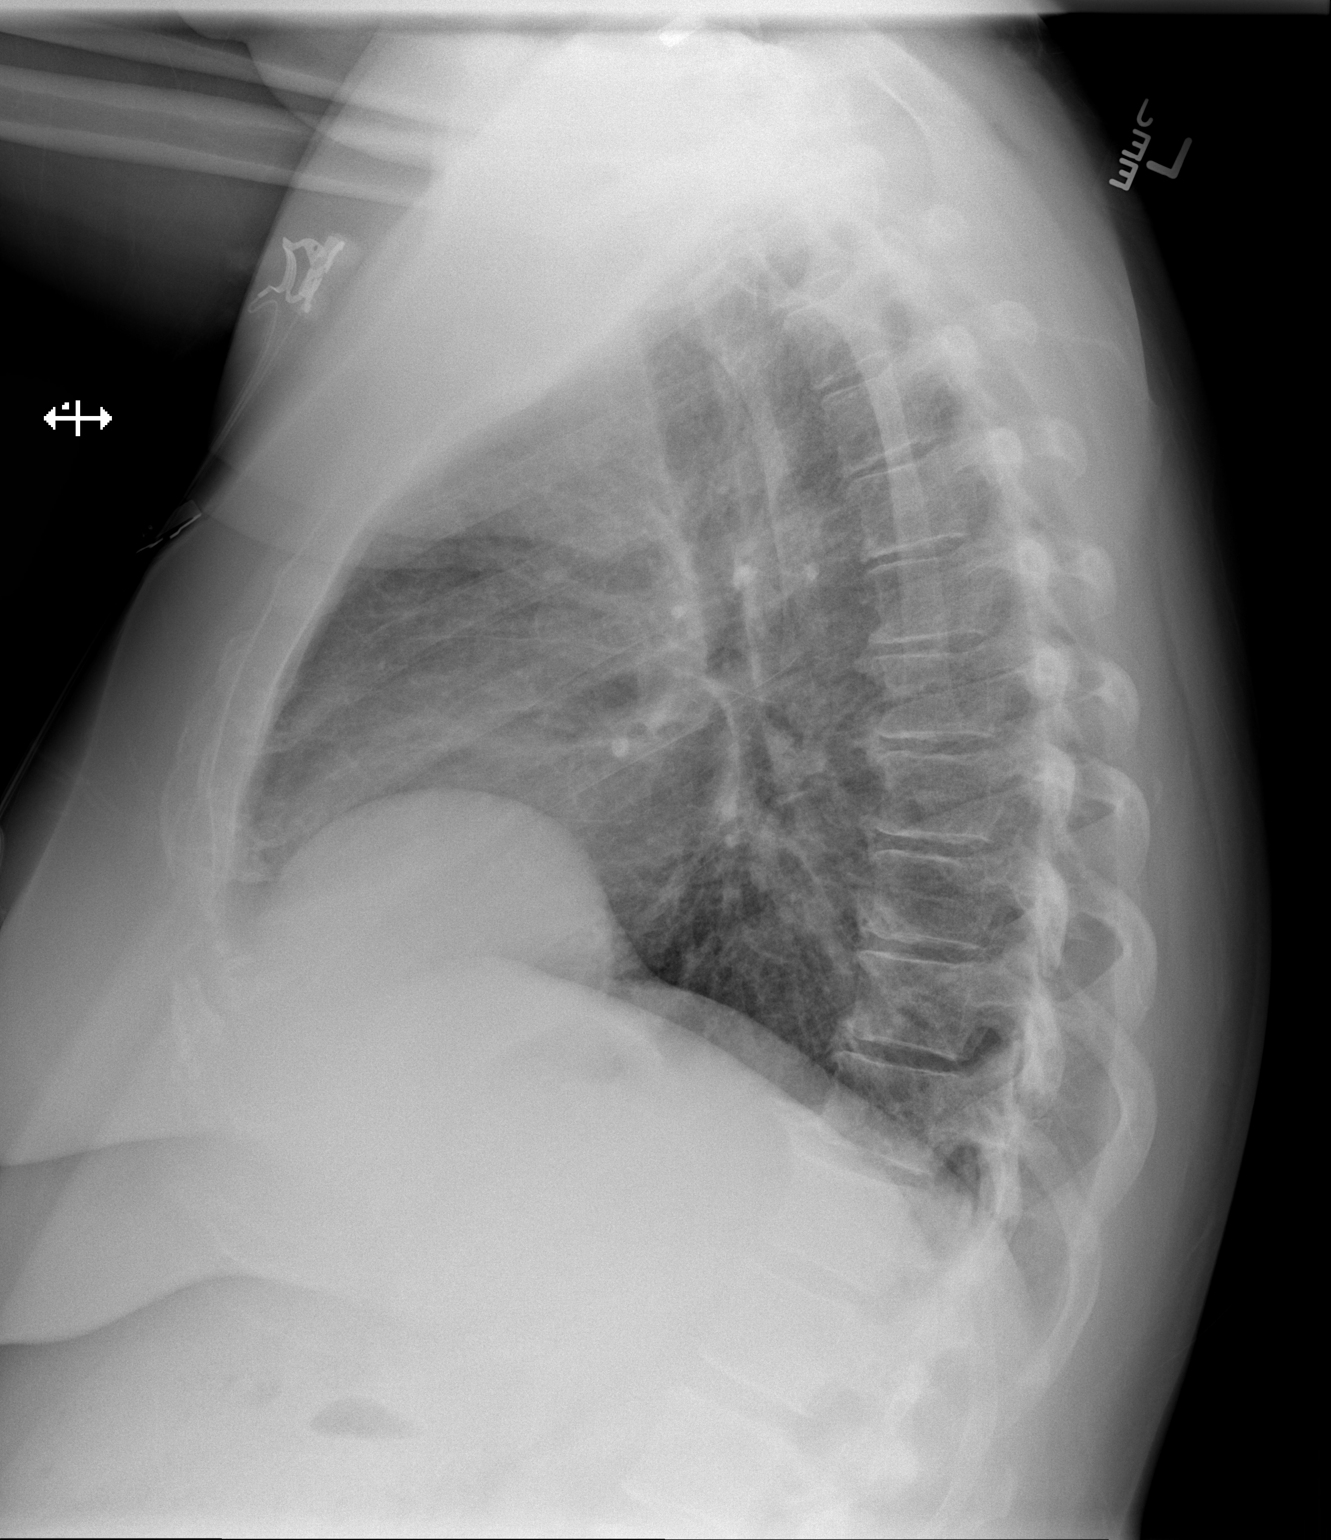

[2 of 2 positions shown; findings below may reference images not displayed]

FINDINGS: The heart size and mediastinal contours are within normal limits.
Both lungs are clear. Thoracic spondylosis is present. Small
eventration of the right anterior hemidiaphragm.
IMPRESSION: 1. No acute findings.
2. Thoracic spondylosis.
3. Chronic eventration of the right anterior hemidiaphragm.

## 2014-01-03 NOTE — Assessment & Plan Note (Signed)
Reorder polysomnogram.

## 2014-01-03 NOTE — Assessment & Plan Note (Signed)
No change to regimen.  Followup with Dr. Buddy Duty

## 2014-01-03 NOTE — Assessment & Plan Note (Signed)
Reassuring cardiac evaluation.  Tramadol has been helpful so will refill this.  Caution with seizure disorder but has been on tramadol for a long time and has not had any breakthrough seizures.

## 2014-01-03 NOTE — Assessment & Plan Note (Signed)
Continues to be a interested in quitting.  Does not want to use nicotine replacement as this has not been helpful in the past.  Was previously started on Chantix but discontinued by Dr. Candis Schatz.  Encouraged her to followup with Dr. Candis Schatz to discuss further options

## 2014-01-14 ENCOUNTER — Encounter: Payer: Self-pay | Admitting: *Deleted

## 2014-01-14 NOTE — Telephone Encounter (Signed)
error 

## 2014-01-21 ENCOUNTER — Encounter: Payer: Self-pay | Admitting: Family Medicine

## 2014-01-21 ENCOUNTER — Ambulatory Visit (INDEPENDENT_AMBULATORY_CARE_PROVIDER_SITE_OTHER): Payer: BC Managed Care – PPO | Admitting: Family Medicine

## 2014-01-21 ENCOUNTER — Telehealth: Payer: Self-pay | Admitting: Family Medicine

## 2014-01-21 VITALS — BP 107/70 | HR 108 | Temp 97.8°F | Ht 64.0 in | Wt 243.2 lb

## 2014-01-21 DIAGNOSIS — T50905A Adverse effect of unspecified drugs, medicaments and biological substances, initial encounter: Secondary | ICD-10-CM

## 2014-01-21 DIAGNOSIS — J309 Allergic rhinitis, unspecified: Secondary | ICD-10-CM | POA: Insufficient documentation

## 2014-01-21 DIAGNOSIS — J449 Chronic obstructive pulmonary disease, unspecified: Secondary | ICD-10-CM

## 2014-01-21 DIAGNOSIS — T887XXA Unspecified adverse effect of drug or medicament, initial encounter: Secondary | ICD-10-CM | POA: Insufficient documentation

## 2014-01-21 DIAGNOSIS — F17200 Nicotine dependence, unspecified, uncomplicated: Secondary | ICD-10-CM

## 2014-01-21 DIAGNOSIS — I10 Essential (primary) hypertension: Secondary | ICD-10-CM

## 2014-01-21 DIAGNOSIS — R0982 Postnasal drip: Secondary | ICD-10-CM

## 2014-01-21 DIAGNOSIS — I209 Angina pectoris, unspecified: Secondary | ICD-10-CM

## 2014-01-21 HISTORY — DX: Allergic rhinitis, unspecified: J30.9

## 2014-01-21 MED ORDER — BENZONATATE 200 MG PO CAPS: 200.0000 mg | ORAL_CAPSULE | Freq: Two times a day (BID) | ORAL | Status: DC | PRN

## 2014-01-21 MED ORDER — LORATADINE 10 MG PO TABS: 10.0000 mg | ORAL_TABLET | Freq: Every day | ORAL | Status: DC

## 2014-01-21 NOTE — Assessment & Plan Note (Signed)
Patient has no desire to quit at present- Precontemplative -Encouraged patient to contact me as soon as she is ready to quit -Patient received smoking cessation literature in her AVS -Patient to contact us with any questions or concerns.

## 2014-01-21 NOTE — Assessment & Plan Note (Addendum)
Patient questions Metoprolol as the culprit for dry heaves and grogginess. -Of note, patient takes several sedating medications including Klonopin, Seroquel, and tramadol.   -Patient is to take Metoprolol separately from other medications so as to see if this is indeed the culprit of her side effects -Continue Metoprolol as directed -Of note, SOB was listed as the side effect on Pt's EMR.  However, this was not expressed to me during the visit.  Attempted to call patient to clarify- no answer. -Plan to discuss this with patient at her earliest convenience. -Patient to call if she continues to experience symptoms or symptoms get worse.

## 2014-01-21 NOTE — Telephone Encounter (Signed)
SOB was listed as the side effect on Pt's EMR.  However, this was not expressed to me during the visit.  Attempted to call patient to clarify- no answer.  Message sent to [facility] in the event that she calls back to clarify this issue.

## 2014-01-21 NOTE — Assessment & Plan Note (Signed)
Based on HPI and exam, I suspect allergic rhinitis/seasonal allergies.  Ddx: URI -Patient prescribed Claritin 10mg  and tessalon perles (vs using honey for cough suppression- pt is diabetic) -Patient to continue to use inhalers as directed -Patient sent home with literature about allergies. -Patient will call if symptoms worsen or if she has any concerns.

## 2014-01-21 NOTE — Patient Instructions (Addendum)
It was a pleasure seeing you today!  Information regarding what we discussed is included in this packet.  Please feel free to call our office if any questions or concerns arise.  Things on your to do list: 1. Make sure that you go for your sleep study appointment  2. Let them know you would like a humidifier portion of your O2 added to that order 3. Take the Loratidine (Claritin) daily for allergy control. 4. Keep taking your Metoprolol (Make sure to take it separately from your other medications so we can see if it is what is causing your symptoms. 4. Let me know if your symptoms don't improve  Allergies  Allergies may happen from anything your body is sensitive to. This may be food, medicines, pollens, chemicals, and many other things. Food allergies can be severe and deadly.  HOME CARE  If you do not know what causes a reaction, keep a diary. Write down the foods you ate and the symptoms that followed. Avoid foods that cause reactions.  If you have red raised spots (hives) or a rash:  Take medicine as told by your doctor.  Use medicines for red raised spots and itching as needed.  Apply cold cloths (compresses) to the skin. Take a cool bath. Avoid hot baths or showers.  If you are severely allergic:  It is often necessary to go to the hospital after you have treated your reaction.  Wear your medical alert jewelry.  You and your family must learn how to give a allergy shot or use an allergy kit (anaphylaxis kit).  Always carry your allergy kit or shot with you. Use this medicine as told by your doctor if a severe reaction is occurring. GET HELP RIGHT AWAY IF:  You have trouble breathing or are making high-pitched whistling sounds (wheezing).  You have a tight feeling in your chest or throat.  You have a puffy (swollen) mouth.  You have red raised spots, puffiness (swelling), or itching all over your body.  You have had a severe reaction that was helped by your allergy kit  or shot. The reaction can return once the medicine has worn off.  You think you are having a food allergy. Symptoms most often happen within 30 minutes of eating a food.  Your symptoms have not gone away within 2 days or are getting worse.  You have new symptoms.  You want to retest yourself with a food or drink you think causes an allergic reaction. Only do this under the care of a doctor. MAKE SURE YOU:   Understand these instructions.  Will watch your condition.  Will get help right away if you are not doing well or get worse. Document Released: 10/22/2012 Document Reviewed: 10/22/2012 [facility] Patient Information 2015 [place]. This information is not intended to replace advice given to you by your health care provider. Make sure you discuss any questions you have with your health care provider. Smoking Cessation Quitting smoking is important to your health and has many advantages. However, it is not always easy to quit since nicotine is a very addictive drug. Often times, people try 3 times or more before being able to quit. This document explains the best ways for you to prepare to quit smoking. Quitting takes hard work and a lot of effort, but you can do it. ADVANTAGES OF QUITTING SMOKING  You will live longer, feel better, and live better.  Your body will feel the impact of quitting smoking almost immediately.  Within 20 minutes, blood pressure decreases. Your pulse returns to its normal level.  After 8 hours, carbon monoxide levels in the blood return to normal. Your oxygen level increases.  After 24 hours, the chance of having a heart attack starts to decrease. Your breath, hair, and body stop smelling like smoke.  After 48 hours, damaged nerve endings begin to recover. Your sense of taste and smell improve.  After 72 hours, the body is virtually free of nicotine. Your bronchial tubes relax and breathing becomes easier.  After 2 to 12 weeks, lungs can hold more  air. Exercise becomes easier and circulation improves.  The risk of having a heart attack, stroke, cancer, or lung disease is greatly reduced.  After 1 year, the risk of coronary heart disease is cut in half.  After 5 years, the risk of stroke falls to the same as a nonsmoker.  After 10 years, the risk of lung cancer is cut in half and the risk of other cancers decreases significantly.  After 15 years, the risk of coronary heart disease drops, usually to the level of a nonsmoker.  If you are pregnant, quitting smoking will improve your chances of having a healthy baby.  The people you live with, especially any children, will be healthier.  You will have extra money to spend on things other than cigarettes. QUESTIONS TO THINK ABOUT BEFORE ATTEMPTING TO QUIT You may want to talk about your answers with your caregiver.  Why do you want to quit?  If you tried to quit in the past, what helped and what did not?  What will be the most difficult situations for you after you quit? How will you plan to handle them?  Who can help you through the tough times? Your family? Friends? A caregiver?  What pleasures do you get from smoking? What ways can you still get pleasure if you quit? Here are some questions to ask your caregiver:  How can you help me to be successful at quitting?  What medicine do you think would be best for me and how should I take it?  What should I do if I need more help?  What is smoking withdrawal like? How can I get information on withdrawal? GET READY  Set a quit date.  Change your environment by getting rid of all cigarettes, ashtrays, matches, and lighters in your home, car, or work. Do not let people smoke in your home.  Review your past attempts to quit. Think about what worked and what did not. GET SUPPORT AND ENCOURAGEMENT You have a better chance of being successful if you have help. You can get support in many ways.  Tell your family, friends, and  co-workers that you are going to quit and need their support. Ask them not to smoke around you.  Get individual, group, or telephone counseling and support. Programs are available at [employer] and health centers. Call your local health department for information about programs in your area.  Spiritual beliefs and practices may help some smokers quit.  Download a "quit meter" on your computer to keep track of quit statistics, such as how long you have gone without smoking, cigarettes not smoked, and money saved.  Get a self-help book about quitting smoking and staying off of tobacco. [place] yourself from urges to smoke. Talk to someone, go for a walk, or occupy your time with a task.  Change your normal routine. Take a different route to  work. Drink tea instead of coffee. Eat breakfast in a different place.  Reduce your stress. Take a hot bath, exercise, or read a book.  Plan something enjoyable to do every day. Reward yourself for not smoking.  Explore interactive web-based programs that specialize in helping you quit. GET MEDICINE AND USE IT CORRECTLY Medicines can help you stop smoking and decrease the urge to smoke. Combining medicine with the above behavioral methods and support can greatly increase your chances of successfully quitting smoking.  Nicotine replacement therapy helps deliver nicotine to your body without the negative effects and risks of smoking. Nicotine replacement therapy includes nicotine gum, lozenges, inhalers, nasal sprays, and skin patches. Some may be available over-the-counter and others require a prescription.  Antidepressant medicine helps people abstain from smoking, but how this works is unknown. This medicine is available by prescription.  Nicotinic receptor partial agonist medicine simulates the effect of nicotine in your brain. This medicine is available by prescription. Ask your caregiver for advice about which  medicines to use and how to use them based on your health history. Your caregiver will tell you what side effects to look out for if you choose to be on a medicine or therapy. Carefully read the information on the package. Do not use any other product containing nicotine while using a nicotine replacement product.  RELAPSE OR DIFFICULT SITUATIONS Most relapses occur within the first 3 months after quitting. Do not be discouraged if you start smoking again. Remember, most people try several times before finally quitting. You may have symptoms of withdrawal because your body is used to nicotine. You may crave cigarettes, be irritable, feel very hungry, cough often, get headaches, or have difficulty concentrating. The withdrawal symptoms are only temporary. They are strongest when you first quit, but they will go away within 10-14 days. To reduce the chances of relapse, try to:  Avoid drinking alcohol. Drinking lowers your chances of successfully quitting.  Reduce the amount of caffeine you consume. Once you quit smoking, the amount of caffeine in your body increases and can give you symptoms, such as a rapid heartbeat, sweating, and anxiety.  Avoid smokers because they can make you want to smoke.  Do not let weight gain distract you. Many smokers will gain weight when they quit, usually less than 10 pounds. Eat a healthy diet and stay active. You can always lose the weight gained after you quit.  Find ways to improve your mood other than smoking. FOR MORE INFORMATION  www.smokefree.gov  Document Released: 06/21/2001 Document Revised: 12/27/2011 Document Reviewed: 10/06/2011 [facility] Patient Information 2015 [place], [place]. This information is not intended to replace advice given to you by your health care provider. Make sure you discuss any questions you have with your health care provider.

## 2014-01-21 NOTE — Assessment & Plan Note (Addendum)
COPD stable -Patient to continue to use Advair and ProAir UD- no refills needed right now. -Resting and ambulatory O2 sat performed -Patient to continue home O2 @2L  during sleep time -Patient to obtain a humidifier for her O2 at home -Patient is scheduled to have a sleep study done 02/21/14 -Patient counseled on smoking- Precontemplative stage -Patient given literature on smoking cessation -Patient to call if questions or concerns arise. -F/U appointment after sleep study conducted.

## 2014-01-21 NOTE — Progress Notes (Signed)
Patient ID: [name], female   DOB: Feb 07, 1971, 43 y.o.   MRN: [id number]    Subjective: CC: home O2 renewal HPI: Patient is a 43 y.o. female presenting to clinic today for home O2 renewal/ testing. Concerns today include:  1. Home O2 Patient uses 2L [place] at night only at home.  She states that her O2 goes down when she sleeps.  She does not monitor O2 at home.  She has had sleep studies done in the past and is scheduled to have another done in August, 2015.  She does admit to SOB and CP with activity that is relieved by rest.  Chest pain is localized to mid sternum and is sharp in nature.  Patient does admit to occasional nausea and dizziness during these episodes.  Patient had a cardiac cath done in June 2015 and was cleared by cardiologist.  Patient admits to continued tobacco use and has no desire to quit right now.  In addition, patient did not bring O2 in today.  2. Cough/wheeze Patient states that she has had cough and wheeze x1 week duration.  She reports that cough is productive (yellow phlegm) but denies coughing up blood or fevers.  She denies any sick contacts.  She has been using Vicks 44D and Robitussin with no relief.  She admits to postnasal drip.  She believes that she has allergies and has used her son's Zyrtec with good relief but reports being severely dried out in her nose.  3. Metoprolol side effects Patient believes she is having side effects from Metoprolol and wants to stop taking medication.  She reports reading side effects on the internet and believes she has "all of them".  She states that she is having "dry heaves and is sleepy" from medication.  She does state that she takes several medications at the same time.     History Reviewed: yes smoker- pre-contemplative. 1PPD x32 yrs Health Maintenance: Immunizations UTD- Pneumococcal vaccine 2012, Tdap 2012  ROS: All other systems reviewed and are negative.  Objective: Office vital signs reviewed. BP 107/70   Pulse 108  Temp(Src) 97.8 F (36.6 C) (Oral)  Ht 5\' 4"  (1.626 m)  Wt 243 lb 3.2 oz (110.315 kg)  BMI 41.72 kg/m2  SpO2 98%  LMP 11/11/2011  Resting pulse Ox without O2: 96-97% Ambulatory pulse Ox without O2: 97-98%  Physical Examination:  General: Awake, alert, obese, NAD HEENT: Atraumatic, normocephalic    Neck: No masses palpated. No LAD    Ears: TMs intact, normal light reflex, no erythema, no bulging    Eyes: PERRLA    Nose: nasal turbinates moist, mild erythema and edema    Throat: MMM, mild cobblestoning of mucosa Cardio: RRR, S1S2 heard, no murmurs appreciated Pulm: CTAB, globally decreased BS, + wheezes in Left base and right apex, no rhonchi or rales MSK: Normal gait and station Neuro: Strength and sensation grossly intact  Assessment: 43 y.o. female with: 1. COPD 2. Tobacco dependence 3. Allergic rhinitis  Plan: See Problem List and After Visit Summary    Nancy Norlander, DO

## 2014-02-03 ENCOUNTER — Ambulatory Visit: Payer: BC Managed Care – PPO | Admitting: Interventional Cardiology

## 2014-02-21 ENCOUNTER — Ambulatory Visit (HOSPITAL_BASED_OUTPATIENT_CLINIC_OR_DEPARTMENT_OTHER): Payer: BC Managed Care – PPO | Attending: Internal Medicine | Admitting: Radiology

## 2014-02-21 VITALS — Ht 64.0 in | Wt 240.0 lb

## 2014-02-21 DIAGNOSIS — G4763 Sleep related bruxism: Secondary | ICD-10-CM | POA: Insufficient documentation

## 2014-02-21 DIAGNOSIS — IMO0002 Reserved for concepts with insufficient information to code with codable children: Secondary | ICD-10-CM

## 2014-02-21 DIAGNOSIS — E1165 Type 2 diabetes mellitus with hyperglycemia: Secondary | ICD-10-CM

## 2014-02-21 DIAGNOSIS — G4733 Obstructive sleep apnea (adult) (pediatric): Secondary | ICD-10-CM | POA: Diagnosis not present

## 2014-02-21 DIAGNOSIS — G4710 Hypersomnia, unspecified: Secondary | ICD-10-CM | POA: Diagnosis present

## 2014-02-21 DIAGNOSIS — G4730 Sleep apnea, unspecified: Secondary | ICD-10-CM | POA: Diagnosis present

## 2014-02-22 DIAGNOSIS — G4730 Sleep apnea, unspecified: Secondary | ICD-10-CM

## 2014-02-22 NOTE — Sleep Study (Signed)
   NAME: [name] DATE OF BIRTH:  11/09/1970 MEDICAL RECORD NUMBER [id number]  LOCATION: [facility]  PHYSICIAN: Estie Sproule D  DATE OF STUDY: 02/21/2014  SLEEP STUDY TYPE: Nocturnal Polysomnogram               REFERRING PHYSICIAN: Gerda Diss, DO  INDICATION FOR STUDY: Hypersomnia with sleep apnea  EPWORTH SLEEPINESS SCORE:   10/24 HEIGHT: 5\' 4"  (162.6 cm)  WEIGHT: 240 lb (108.863 kg)    Body mass index is 41.18 kg/(m^2).  NECK SIZE: 15.5 in.  MEDICATIONS: Charted for review  SLEEP ARCHITECTURE: Total sleep time 341.5 minutes with sleep efficiency 85.7%. Stage I was 11.3%, stage II 75.1%, stage III 0.7%, REM 12.9% of total sleep time. Sleep latency 24.5 minutes, REM latency 225.5 minutes, awake after sleep onset 21.5 minutes, arousal index 20, bedtime medication: Albuterol nebulizer treatment, Seroquel, lisinopril  RESPIRATORY DATA: Apnea hypopneas index (AHI) 6.7 per hour. 38 total events scored including 2 obstructive apneas and 36 hypopneas. Evidence for seen in all positions but more common while supine. REM AHI 17.7 per hour. There were not enough early events to meet protocol requirements for split CPAP titration on this study night.  OXYGEN DATA: Moderate snoring. On arrival, while awake, room air saturation was 91%. With sleep onset she desaturated to a nadir of 83% on room air. Per protocol, the technician started supplemental nasal oxygen at 1 L per minute to maintain saturation over 88%. Mean oxygen saturation through the study was 90.8%.  CARDIAC DATA: Sinus rhythm with PVCs and couplets  MOVEMENT/PARASOMNIA: A few incidental limb jerks were noted with little effect on sleep. Bruxism and somniloquy/sleep talking were noted. Bathroom x1  IMPRESSION/ RECOMMENDATION:   1) Mild obstructive sleep apnea/hypopneas syndrome,  AHI 6.7 per hour with events in all positions. REM AHI 17.7 per hour. Moderate snoring. There were not enough early events  to meet protocol requirements for split CPAP titration. 2)  On arrival, while awake, room air saturation was 91%. With sleep onset she desaturated to a nadir of 83% on room air. Per protocol, the technician started supplemental nasal oxygen at 1 L per minute to maintain saturation over 88%. Mean oxygen saturation through the study was 90.8%. 3) Occasional bruxism and somniloquy/sleep talking were noted  Deneise Lever Diplomate, American Board of Sleep Medicine  ELECTRONICALLY SIGNED ON:  02/22/2014, 2:12 PM [place] PH: [phone]   FX: [phone] [place]

## 2014-02-28 ENCOUNTER — Encounter: Payer: Self-pay | Admitting: Family Medicine

## 2014-02-28 NOTE — Progress Notes (Signed)
Linda from [employer] needs to speak to nurse regarding papers they need for patient to continue to get oxygen in her home.  Please call at [phone] ext [phone]

## 2014-03-06 ENCOUNTER — Telehealth: Payer: Self-pay | Admitting: *Deleted

## 2014-03-06 NOTE — Telephone Encounter (Signed)
done

## 2014-03-10 NOTE — Progress Notes (Signed)
Spoke with Vaughan Basta and she requested additional information/notes so that it can be reviewed for her insurance. I have faxed over OV notes and sleep study notes to her at [phone]

## 2014-03-28 ENCOUNTER — Ambulatory Visit (INDEPENDENT_AMBULATORY_CARE_PROVIDER_SITE_OTHER): Payer: BC Managed Care – PPO | Admitting: Family Medicine

## 2014-03-28 ENCOUNTER — Encounter: Payer: Self-pay | Admitting: Family Medicine

## 2014-03-28 VITALS — BP 133/72 | HR 110 | Ht 64.0 in | Wt 236.0 lb

## 2014-03-28 DIAGNOSIS — I209 Angina pectoris, unspecified: Secondary | ICD-10-CM

## 2014-03-28 DIAGNOSIS — E1149 Type 2 diabetes mellitus with other diabetic neurological complication: Secondary | ICD-10-CM

## 2014-03-28 DIAGNOSIS — F17200 Nicotine dependence, unspecified, uncomplicated: Secondary | ICD-10-CM | POA: Diagnosis not present

## 2014-03-28 DIAGNOSIS — G4730 Sleep apnea, unspecified: Secondary | ICD-10-CM

## 2014-03-28 MED ORDER — TRAMADOL HCL 50 MG PO TABS: 100.0000 mg | ORAL_TABLET | Freq: Two times a day (BID) | ORAL | Status: DC | PRN

## 2014-03-28 NOTE — Patient Instructions (Addendum)
It was a pleasure seeing you today!  Information regarding what we discussed is included in this packet.  Please feel free to call our office if any questions or concerns arise.  Please schedule appointment with Dr Valentina Lucks for spirometry testing.  Plan for follow up to discuss your chronic conditions in 2 months.  Thank you for allowing me to care for you, [name] M. Jomar Denz, DO  Sleep Apnea Sleep apnea is disorder that affects a person's sleep. A person with sleep apnea has abnormal pauses in their breathing when they sleep. It is hard for them to get a good sleep. This makes a person tired during the day. It also can lead to other physical problems. There are three types of sleep apnea. One type is when breathing stops for a short time because your airway is blocked (obstructive sleep apnea). Another type is when the brain sometimes fails to give the normal signal to breathe to the muscles that control your breathing (central sleep apnea). The third type is a combination of the other two types. HOME CARE  Do not sleep on your back. Try to sleep on your side.  Take all medicine as told by your doctor.  Avoid alcohol, calming medicines (sedatives), and depressant drugs.  Try to lose weight if you are overweight. Talk to your doctor about a healthy weight goal. Your doctor may have you use a device that helps to open your airway. It can help you get the air that you need. It is called a positive airway pressure (PAP) device. There are three types of PAP devices:  Continuous positive airway pressure (CPAP) device.  Nasal expiratory positive airway pressure (EPAP) device.  Bilevel positive airway pressure (BPAP) device. MAKE SURE YOU:  Understand these instructions.  Will watch your condition.  Will get help right away if you are not doing well or get worse. Document Released: 04/05/2008 Document Revised: 06/13/2012 Document Reviewed: 10/29/2011 [facility] Patient Information 2015  [place], [place]. This information is not intended to replace advice given to you by your health care provider. Make sure you discuss any questions you have with your health care provider.  Smoking Cessation Quitting smoking is important to your health and has many advantages. However, it is not always easy to quit since nicotine is a very addictive drug. Oftentimes, people try 3 times or more before being able to quit. This document explains the best ways for you to prepare to quit smoking. Quitting takes hard work and a lot of effort, but you can do it. ADVANTAGES OF QUITTING SMOKING  You will live longer, feel better, and live better.  Your body will feel the impact of quitting smoking almost immediately.  Within 20 minutes, blood pressure decreases. Your pulse returns to its normal level.  After 8 hours, carbon monoxide levels in the blood return to normal. Your oxygen level increases.  After 24 hours, the chance of having a heart attack starts to decrease. Your breath, hair, and body stop smelling like smoke.  After 48 hours, damaged nerve endings begin to recover. Your sense of taste and smell improve.  After 72 hours, the body is virtually free of nicotine. Your bronchial tubes relax and breathing becomes easier.  After 2 to 12 weeks, lungs can hold more air. Exercise becomes easier and circulation improves.  The risk of having a heart attack, stroke, cancer, or lung disease is greatly reduced.  After 1 year, the risk of coronary heart disease is cut in half.  After  5 years, the risk of stroke falls to the same as a nonsmoker.  After 10 years, the risk of lung cancer is cut in half and the risk of other cancers decreases significantly.  After 15 years, the risk of coronary heart disease drops, usually to the level of a nonsmoker.  If you are pregnant, quitting smoking will improve your chances of having a healthy baby.  The people you live with, especially any children, will be  healthier.  You will have extra money to spend on things other than cigarettes. QUESTIONS TO THINK ABOUT BEFORE ATTEMPTING TO QUIT You may want to talk about your answers with your health care provider.  Why do you want to quit?  If you tried to quit in the past, what helped and what did not?  What will be the most difficult situations for you after you quit? How will you plan to handle them?  Who can help you through the tough times? Your family? Friends? A health care provider?  What pleasures do you get from smoking? What ways can you still get pleasure if you quit? Here are some questions to ask your health care provider:  How can you help me to be successful at quitting?  What medicine do you think would be best for me and how should I take it?  What should I do if I need more help?  What is smoking withdrawal like? How can I get information on withdrawal? GET READY  Set a quit date.  Change your environment by getting rid of all cigarettes, ashtrays, matches, and lighters in your home, car, or work. Do not let people smoke in your home.  Review your past attempts to quit. Think about what worked and what did not. GET SUPPORT AND ENCOURAGEMENT You have a better chance of being successful if you have help. You can get support in many ways.  Tell your family, friends, and coworkers that you are going to quit and need their support. Ask them not to smoke around you.  Get individual, group, or telephone counseling and support. Programs are available at [employer] and health centers. Call your local health department for information about programs in your area.  Spiritual beliefs and practices may help some smokers quit.  Download a "quit meter" on your computer to keep track of quit statistics, such as how long you have gone without smoking, cigarettes not smoked, and money saved.  Get a self-help book about quitting smoking and staying off tobacco. [place] yourself from urges to smoke. Talk to someone, go for a walk, or occupy your time with a task.  Change your normal routine. Take a different route to work. Drink tea instead of coffee. Eat breakfast in a different place.  Reduce your stress. Take a hot bath, exercise, or read a book.  Plan something enjoyable to do every day. Reward yourself for not smoking.  Explore interactive web-based programs that specialize in helping you quit. GET MEDICINE AND USE IT CORRECTLY Medicines can help you stop smoking and decrease the urge to smoke. Combining medicine with the above behavioral methods and support can greatly increase your chances of successfully quitting smoking.  Nicotine replacement therapy helps deliver nicotine to your body without the negative effects and risks of smoking. Nicotine replacement therapy includes nicotine gum, lozenges, inhalers, nasal sprays, and skin patches. Some may be available over-the-counter and others require a prescription.  Antidepressant medicine helps people abstain from  smoking, but how this works is unknown. This medicine is available by prescription.  Nicotinic receptor partial agonist medicine simulates the effect of nicotine in your brain. This medicine is available by prescription. Ask your health care provider for advice about which medicines to use and how to use them based on your health history. Your health care provider will tell you what side effects to look out for if you choose to be on a medicine or therapy. Carefully read the information on the package. Do not use any other product containing nicotine while using a nicotine replacement product.  RELAPSE OR DIFFICULT SITUATIONS Most relapses occur within the first 3 months after quitting. Do not be discouraged if you start smoking again. Remember, most people try several times before finally quitting. You may have symptoms of withdrawal because your body is used to nicotine.  You may crave cigarettes, be irritable, feel very hungry, cough often, get headaches, or have difficulty concentrating. The withdrawal symptoms are only temporary. They are strongest when you first quit, but they will go away within 10-14 days. To reduce the chances of relapse, try to:  Avoid drinking alcohol. Drinking lowers your chances of successfully quitting.  Reduce the amount of caffeine you consume. Once you quit smoking, the amount of caffeine in your body increases and can give you symptoms, such as a rapid heartbeat, sweating, and anxiety.  Avoid smokers because they can make you want to smoke.  Do not let weight gain distract you. Many smokers will gain weight when they quit, usually less than 10 pounds. Eat a healthy diet and stay active. You can always lose the weight gained after you quit.  Find ways to improve your mood other than smoking. FOR MORE INFORMATION  www.smokefree.gov  Document Released: 06/21/2001 Document Revised: 11/11/2013 Document Reviewed: 10/06/2011 [facility] Patient Information 2015 [place], [place]. This information is not intended to replace advice given to you by your health care provider. Make sure you discuss any questions you have with your health care provider.

## 2014-03-28 NOTE — Progress Notes (Signed)
Patient ID: [name], female   DOB: [birth year], 43 y.o.   MRN: [id number]    Subjective: CC:f/u on sleep study HPI: Patient is a 43 y.o. female presenting to clinic today for f/u sleep study. Concerns today include:  Sleep Apnea Patient recently had a sleep study done in August.  She states that they had to place 1L of O2NC on her because she was desaturating while she slept.  She reports that she feels like she's occasionally being strangled when she sleeps when she does not have the O2 on.  She also complains of excessive thirst/ throat dryness.  However, she does mention that she runs a fan directly on her at high flow because she feels that throat closes when she gets overheated.  She reports that she has been using 2-3 L at nighttime.  She admits to continued smoking.  She admits to dyspnea with a lot of exertion but otherwise feels like she breathes ok without oxygen during the day.  She is compliant with COPD medications.  She denies CP.  Lastly, she remarks that she had some of the best sleep ever when she was used CPAP during study.  History Reviewed: yes smoker. No changes in history.  ROS: All other systems reviewed and are negative.  Sleep study results reviewed with patient.  Objective: Office vital signs reviewed. BP 133/72  Pulse 110  Ht 5\' 4"  (1.626 m)  Wt 236 lb (107.049 kg)  BMI 40.49 kg/m2  SpO2 97%  LMP 11/11/2011  Physical Examination:  General: Awake, alert, obese female, NAD, accompanied by son HEENT: Atraumatic, normocephalic Cardio: G8Q7, RRR, no mumurs/thrills Pulm: globally decreased breath sounds, mild intermittent expiratory wheezes, no rhonchi or rales, no increased WOB or retractions Extremities: No edema, normal gait  Assessment: 43 y.o. female with sleep apnea  Plan: See Problem List and After Visit Summary   Nancy Norlander, DO PGY-1, [place]

## 2014-03-28 NOTE — Assessment & Plan Note (Signed)
Patient recently had a sleep study done.  Unfortunately, the provider did was not clear as to whether [name] merited CPAP.  She was observed to have desats down to 83%, requiring 1L O2.   -Study reviewed with patient -Continue home O2 during sleep -Counseled on smoking cessation -Patient to schedule appt with Dr Valentina Lucks for spirometry testing (would like to make sure we are maximizing her treatment for COPD)  -Patient to schedule f/u for chronic conditions -Patient to call with questions or concerns

## 2014-03-28 NOTE — Assessment & Plan Note (Signed)
Patient continues to smoke.  Precontemplative stage. -Will continue to encourage smoking cessation

## 2014-03-31 ENCOUNTER — Other Ambulatory Visit: Payer: Self-pay | Admitting: Family Medicine

## 2014-03-31 ENCOUNTER — Telehealth: Payer: Self-pay | Admitting: Family Medicine

## 2014-03-31 MED ORDER — LISINOPRIL 10 MG PO TABS: 10.0000 mg | ORAL_TABLET | Freq: Every day | ORAL | Status: DC

## 2014-03-31 NOTE — Telephone Encounter (Signed)
Refill request for Lisinopril 10 mg.

## 2014-04-01 ENCOUNTER — Encounter: Payer: Self-pay | Admitting: Pharmacist

## 2014-04-01 ENCOUNTER — Ambulatory Visit (INDEPENDENT_AMBULATORY_CARE_PROVIDER_SITE_OTHER): Payer: BC Managed Care – PPO | Admitting: Pharmacist

## 2014-04-01 VITALS — BP 119/77 | HR 120 | Ht 64.0 in | Wt 236.6 lb

## 2014-04-01 DIAGNOSIS — R0989 Other specified symptoms and signs involving the circulatory and respiratory systems: Secondary | ICD-10-CM

## 2014-04-01 DIAGNOSIS — F17200 Nicotine dependence, unspecified, uncomplicated: Secondary | ICD-10-CM

## 2014-04-01 DIAGNOSIS — IMO0001 Reserved for inherently not codable concepts without codable children: Secondary | ICD-10-CM

## 2014-04-01 DIAGNOSIS — E1165 Type 2 diabetes mellitus with hyperglycemia: Secondary | ICD-10-CM

## 2014-04-01 DIAGNOSIS — R0609 Other forms of dyspnea: Secondary | ICD-10-CM

## 2014-04-01 DIAGNOSIS — IMO0002 Reserved for concepts with insufficient information to code with codable children: Secondary | ICD-10-CM

## 2014-04-01 DIAGNOSIS — R0600 Dyspnea, unspecified: Secondary | ICD-10-CM

## 2014-04-01 MED ORDER — TIOTROPIUM BROMIDE MONOHYDRATE 2.5 MCG/ACT IN AERS: 2.0000 | INHALATION_SPRAY | Freq: Every day | RESPIRATORY_TRACT | Status: DC

## 2014-04-01 NOTE — Progress Notes (Signed)
S:  Patient arrives in good spirits, unassisted and with her son. Patient currently uses her Advair three times a day and her nebulized albuterol twice a day. She uses 2 L of O2 at night but does not require any during the day. Presents for lung function evaluation.  Currently smoking 1 pack per day and denies interest in quitting tobacco at this time, although she states that her husband wants her to quit smoking.  She took a nap earlier today, during which she used her home O2. Subjectively, she reports constantly being out of breath including walking.   Patient reports that she has been using her U-500 insulin twice daily.  She administers 30-50 units of U-500 in the AM an 50 units in the PM.   Her typical BG readings in the morning are in the 120s, with afternoon readings before meals in the 400s. Patient reports a few episodes of hypoglycemia with BG readings in the 80s. She treated this by eating a sandwich.    O:  CAT score= 35  See Documentation Flowsheet - CAT/COPD for complete symptom scoring.  See "scanned report" or Documentation Flowsheet (discrete results - PFTs) for Spirometry results. Patient provided good effort while attempting spirometry.  Lung Age = 43 years  A1c 9.6% in June 2015.   A/P:  Patient has history of COPD, however spirometry evaluation reveals "normal spirometry" with FEV1% of 90. Patient is fully bronchodilated at today's visit, reporting that she used her Advair twice and her albuterol nebulizer twice earlier today.  Will treat as GOLD Classification B based on CAT score of 35 and FEV1 > 50%.   Will initiate Spiriva Respimat to be taken 2 inhalations daily.  Educated patient on purpose, proper use, potential adverse effects. Educated patient to use Advair twice daily rather than three times daily AND to use her albuterol only as needed for dyspnea.  Reviewed results of pulmonary function tests. Pt verbalized understanding of results and education. Written pt  instructions provided.   Chronic tobacco abuse for past 30 years. Currently smoking 1 pack per day and is not ready for smoking cessation at this time.  Reevaluate at next visit. Encourage tobacco reduction/cessation.  History of insulin resistant diabetes.   Currently managed by use of U-500 insulin by Dr. Buddy Duty.  Patient sees Dr. Buddy Duty once every 1-2 months.  Educated patient to treat hypoglycemia with 4 oz of juice or regular soda then eating a meal like a sandwich is appropriate.  Encouraged to follow up with DM management as scheduled.  F/U Clinic visit in two weeks with PCP - Dr. Lajuana Ripple.  Total time in face to face counseling 30 minutes. Patient seen with Fuller [place], PharmD Resident.

## 2014-04-01 NOTE — Patient Instructions (Signed)
It was nice to see you in clinic today. Start using Spiriva inhaler 2 puffs once a day. Continue using albuterol nebulizers as needed. Only use Advair twice a day. See [place] again in clinic in 2 weeks. Follow up with Dr. Buddy Duty as scheduled.

## 2014-04-01 NOTE — Assessment & Plan Note (Signed)
Patient has history of COPD, however spirometry evaluation reveals "normal spirometry" with FEV1% of 90. Patient is fully bronchodilated at today's visit, reporting that she used her Advair twice and her albuterol nebulizer twice earlier today.  Will treat as GOLD Classification B based on CAT score of 35 and FEV1 > 50%.   Will initiate Spiriva Respimat to be taken 2 inhalations daily.  Educated patient on purpose, proper use, potential adverse effects. Educated patient to use Advair twice daily rather than three times daily AND to use her albuterol only as needed for dyspnea.  Reviewed results of pulmonary function tests. Pt verbalized understanding of results and education. Written pt instructions provided.   Chronic tobacco abuse for past 30 years. Currently smoking 1 pack per day and is not ready for smoking cessation at this time.  Reevaluate at next visit. Encourage tobacco reduction/cessation.

## 2014-04-01 NOTE — Assessment & Plan Note (Signed)
History of insulin resistant diabetes.   Currently managed by use of U-500 insulin by Dr. Buddy Duty.  Patient sees Dr. Buddy Duty once every 1-2 months.  Educated patient to treat hypoglycemia with 4 oz of juice or regular soda then eating a meal like a sandwich is appropriate.  Encouraged to follow up with DM management as scheduled.

## 2014-04-01 NOTE — Assessment & Plan Note (Addendum)
Chronic tobacco abuse for past 30 years. Currently smoking 1 pack per day and is not ready for smoking cessation at this time.  Reevaluate at next visit. Encourage tobacco reduction/cessation.

## 2014-04-02 NOTE — Progress Notes (Signed)
Patient ID: [name], female   DOB: [birth year], 43 y.o.   MRN: [id number] Reviewed: Agree with Dr. Graylin Shiver documentation and management.

## 2014-04-08 ENCOUNTER — Other Ambulatory Visit: Payer: Self-pay | Admitting: Family Medicine

## 2014-04-08 ENCOUNTER — Other Ambulatory Visit: Payer: Self-pay | Admitting: *Deleted

## 2014-04-08 DIAGNOSIS — E1149 Type 2 diabetes mellitus with other diabetic neurological complication: Secondary | ICD-10-CM

## 2014-04-08 MED ORDER — PREGABALIN 150 MG PO CAPS: 150.0000 mg | ORAL_CAPSULE | Freq: Two times a day (BID) | ORAL | Status: DC

## 2014-04-08 NOTE — Progress Notes (Signed)
Rx called into pharmacy with 5 RFs. Patient advised to scheduled f/u on chronic conditions.  Crist Kruszka M. Lajuana Ripple, DO

## 2014-04-09 NOTE — Progress Notes (Signed)
Has appt 04/18/2014. [name], [name]

## 2014-04-15 ENCOUNTER — Ambulatory Visit (INDEPENDENT_AMBULATORY_CARE_PROVIDER_SITE_OTHER): Payer: BC Managed Care – PPO | Admitting: Pharmacist

## 2014-04-15 ENCOUNTER — Encounter: Payer: Self-pay | Admitting: Pharmacist

## 2014-04-15 VITALS — BP 108/66 | HR 113 | Ht 66.0 in | Wt 237.0 lb

## 2014-04-15 DIAGNOSIS — R0600 Dyspnea, unspecified: Secondary | ICD-10-CM | POA: Diagnosis not present

## 2014-04-15 DIAGNOSIS — I209 Angina pectoris, unspecified: Secondary | ICD-10-CM

## 2014-04-15 NOTE — Assessment & Plan Note (Signed)
History of longstanding smpking and possible lung disease.   Improved breathing with use of Spiriva Respimat to be taken 1 inhalation daily. Plan to INCREASE dose of Spiriva to target dose of 2 inhalations daily. Reducated patient on purpose, proper use, potential adverse effects. Permitted current use of Advair ONCE daily as she is breathing better with Spiriva.  She will continue to use her albuterol only as needed for dyspnea. Written pt instructions provided. Chronic tobacco abuse for past 30 years. Currently smoking 1 pack per day and is not ready for smoking cessation at this time.  Reevaluate at next visit. Encourage tobacco reduction/cessation.   Consider taper OFF of Advair at next visit if breathing improved.

## 2014-04-15 NOTE — Progress Notes (Signed)
S:    Patient arrives walking unassisted, accompanied by brother. Presents for f/u post initiation of Spiriva.  Patient reports breathing has been better since initiation.   However, she admits to taking ONE inhalation daily of both Advair and Spriva.  She believe the Spiriva helps more than her Advair.   Patient has multiple complaints about respiratory evaluation and nighttime symptoms of dyspnea.   She continues to use Oxygen 2 .5 L rate at night.   A/P: History of longstanding smpking and possible lung disease.   Improved breathing with use of Spiriva Respimat to be taken 1 inhalation daily. Plan to INCREASE dose of Spiriva to target dose of 2 inhalations daily. Reducated patient on purpose, proper use, potential adverse effects. Permitted current use of Advair ONCE daily as she is breathing better with Spiriva.  She will continue to use her albuterol only as needed for dyspnea. Written pt instructions provided. Chronic tobacco abuse for past 30 years. Currently smoking 1 pack per day and is not ready for smoking cessation at this time.  Reevaluate at next visit. Encourage tobacco reduction/cessation.   Consider taper OFF of Advair at next visit if breathing improved.    More discussion with her PCP on CPAP/BIPAP or other respiratory work up at next visit planned.

## 2014-04-15 NOTE — Patient Instructions (Signed)
Thanks for coming to see me today! Please increase your Spiriva inhaler to twice a day, and continue using the Advair one time a day. If you have any interest in trying to quit smoking don't hesitate to contact me.

## 2014-04-16 NOTE — Progress Notes (Signed)
Patient ID: [name], female   DOB: Apr 20, 1971, 43 y.o.   MRN: [id number] Reviewed: Agree with Dr. Graylin Shiver documentation and management.

## 2014-04-18 ENCOUNTER — Ambulatory Visit: Payer: BC Managed Care – PPO | Admitting: Family Medicine

## 2014-04-25 DIAGNOSIS — F3131 Bipolar disorder, current episode depressed, mild: Secondary | ICD-10-CM | POA: Diagnosis not present

## 2014-05-02 ENCOUNTER — Ambulatory Visit: Payer: BC Managed Care – PPO | Admitting: Family Medicine

## 2014-06-19 ENCOUNTER — Encounter (HOSPITAL_COMMUNITY): Payer: Self-pay | Admitting: Interventional Cardiology

## 2014-06-19 ENCOUNTER — Other Ambulatory Visit: Payer: Self-pay | Admitting: Family Medicine

## 2014-06-19 DIAGNOSIS — G4734 Idiopathic sleep related nonobstructive alveolar hypoventilation: Secondary | ICD-10-CM

## 2014-06-30 ENCOUNTER — Encounter: Payer: Self-pay | Admitting: Family Medicine

## 2014-06-30 NOTE — Progress Notes (Signed)
Patient request refill for Tramadol 50 mg. Please follow up with Patient.

## 2014-06-30 NOTE — Progress Notes (Signed)
Patient needs to come in for a same day appt if she is having pain.

## 2014-07-01 NOTE — Progress Notes (Signed)
Pt informed, appt. Scheduled tomorrow at 1:45 due to still having pain.Katharina Caper, April D

## 2014-07-02 ENCOUNTER — Ambulatory Visit (INDEPENDENT_AMBULATORY_CARE_PROVIDER_SITE_OTHER): Payer: BC Managed Care – PPO | Admitting: Family Medicine

## 2014-07-02 ENCOUNTER — Encounter: Payer: Self-pay | Admitting: Family Medicine

## 2014-07-02 DIAGNOSIS — E1165 Type 2 diabetes mellitus with hyperglycemia: Secondary | ICD-10-CM

## 2014-07-02 DIAGNOSIS — I209 Angina pectoris, unspecified: Secondary | ICD-10-CM

## 2014-07-02 DIAGNOSIS — IMO0002 Reserved for concepts with insufficient information to code with codable children: Secondary | ICD-10-CM

## 2014-07-02 DIAGNOSIS — E1142 Type 2 diabetes mellitus with diabetic polyneuropathy: Secondary | ICD-10-CM | POA: Diagnosis not present

## 2014-07-02 LAB — POCT GLYCOSYLATED HEMOGLOBIN (HGB A1C): Hemoglobin A1C: 9.6

## 2014-07-02 MED ORDER — TRAMADOL HCL 50 MG PO TABS: 100.0000 mg | ORAL_TABLET | Freq: Two times a day (BID) | ORAL | Status: DC | PRN

## 2014-07-02 NOTE — Progress Notes (Signed)
Patient ID: [name], female   DOB: 04-30-1971, 43 y.o.   MRN: [id number] Subjective: Charika Mikelson is a 43 y.o. female presenting for medication refill.  She takes lyrica 150mg  BID, tramadol 50mg  BID and aleve prn for pain related to diabetic neuropathy. She admits to using tramadol also on a prn basis about 3-4 times per week. Pain is sharp and burning, severe, intermittent, worsened with activity improved with medications and has been getting gradually worse over the past few years. She sees an endocrinologist for diabetic management of insulin-dependent type 2 diabetes.   - Review of Systems: Per HPI.  - Smoking status noted  Objective: Vitals reviewed Gen: Overweight 43 y.o. female in no distress No sensation to monofilament or vibratory testing below the mid calf bilaterally. No wounds or ulcerations noted bilaterally.   Assessment/Plan: Dru Laurel is a 43 y.o. female here for pain medication refill for diabetic neuropathy.

## 2014-07-02 NOTE — Assessment & Plan Note (Signed)
Discussed medication adherence at length due to BID scheduled dosing running out about 30 days early x2 due to her use of medication prn in addition to as directed. Will follow up with PCP concerning this. Is followed by endocrinologist so no changes in diabetes management were made today.

## 2014-07-02 NOTE — Progress Notes (Signed)
Patient ID: [name], female   DOB: Nov 19, 1970, 43 y.o.   MRN: [id number] Filled out paperwork for O2 today.  Will place a copy in basket for scanning.  Will fax back to [phone] [facility]).  Richele Strand M. Lajuana Ripple, DO PGY-1, [place]

## 2014-07-22 ENCOUNTER — Other Ambulatory Visit: Payer: Self-pay

## 2014-07-22 DIAGNOSIS — M7989 Other specified soft tissue disorders: Secondary | ICD-10-CM

## 2014-07-22 MED ORDER — FUROSEMIDE 40 MG PO TABS: 40.0000 mg | ORAL_TABLET | Freq: Every day | ORAL | Status: DC

## 2014-08-04 IMAGING — CR DG CHEST 2V
2 series · 2 of 2 positions shown · non-contrast
Comparison: April 02, 2013

CLINICAL DATA: Cough

EXAM:
CHEST  2 VIEW

[w chest pa]
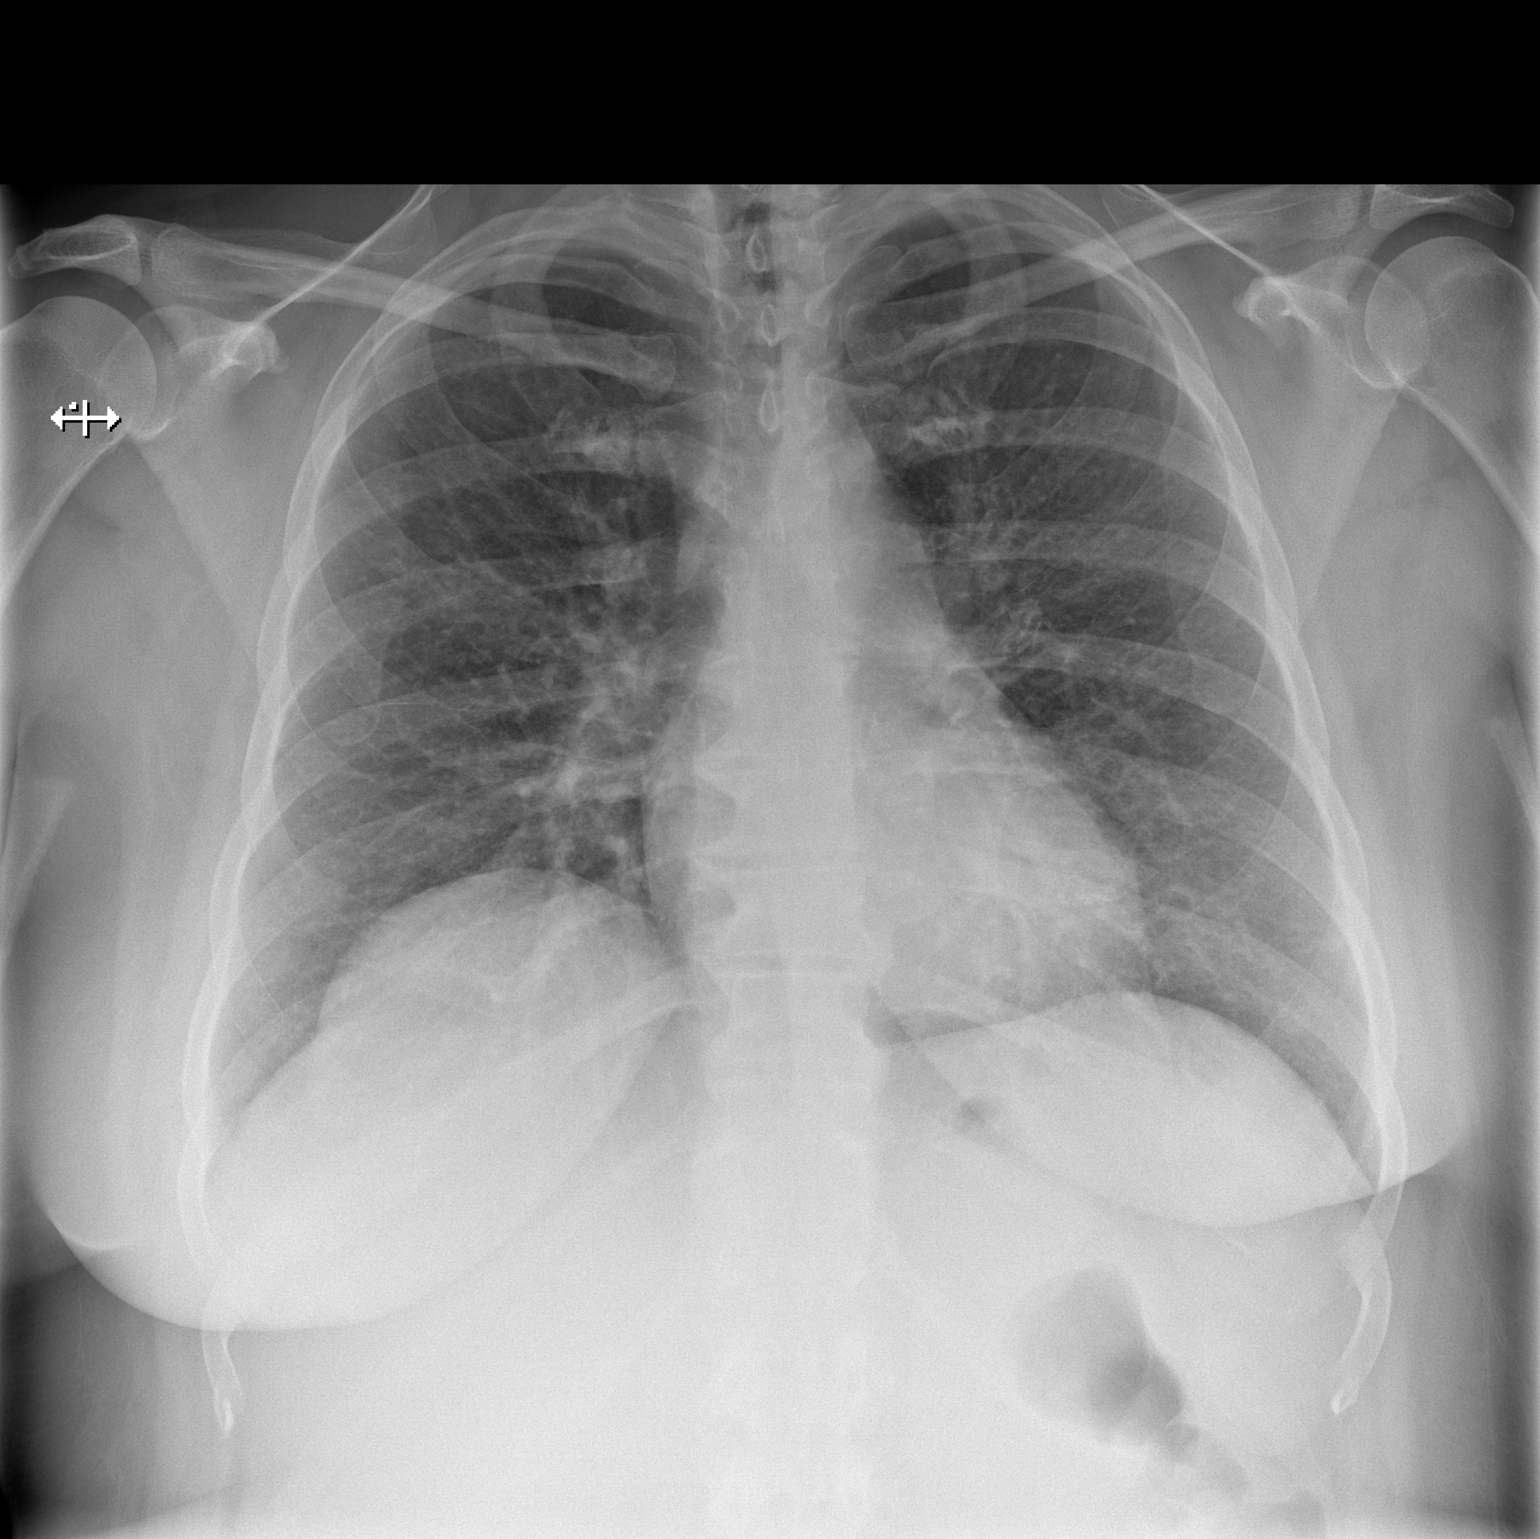

[w chest lat]
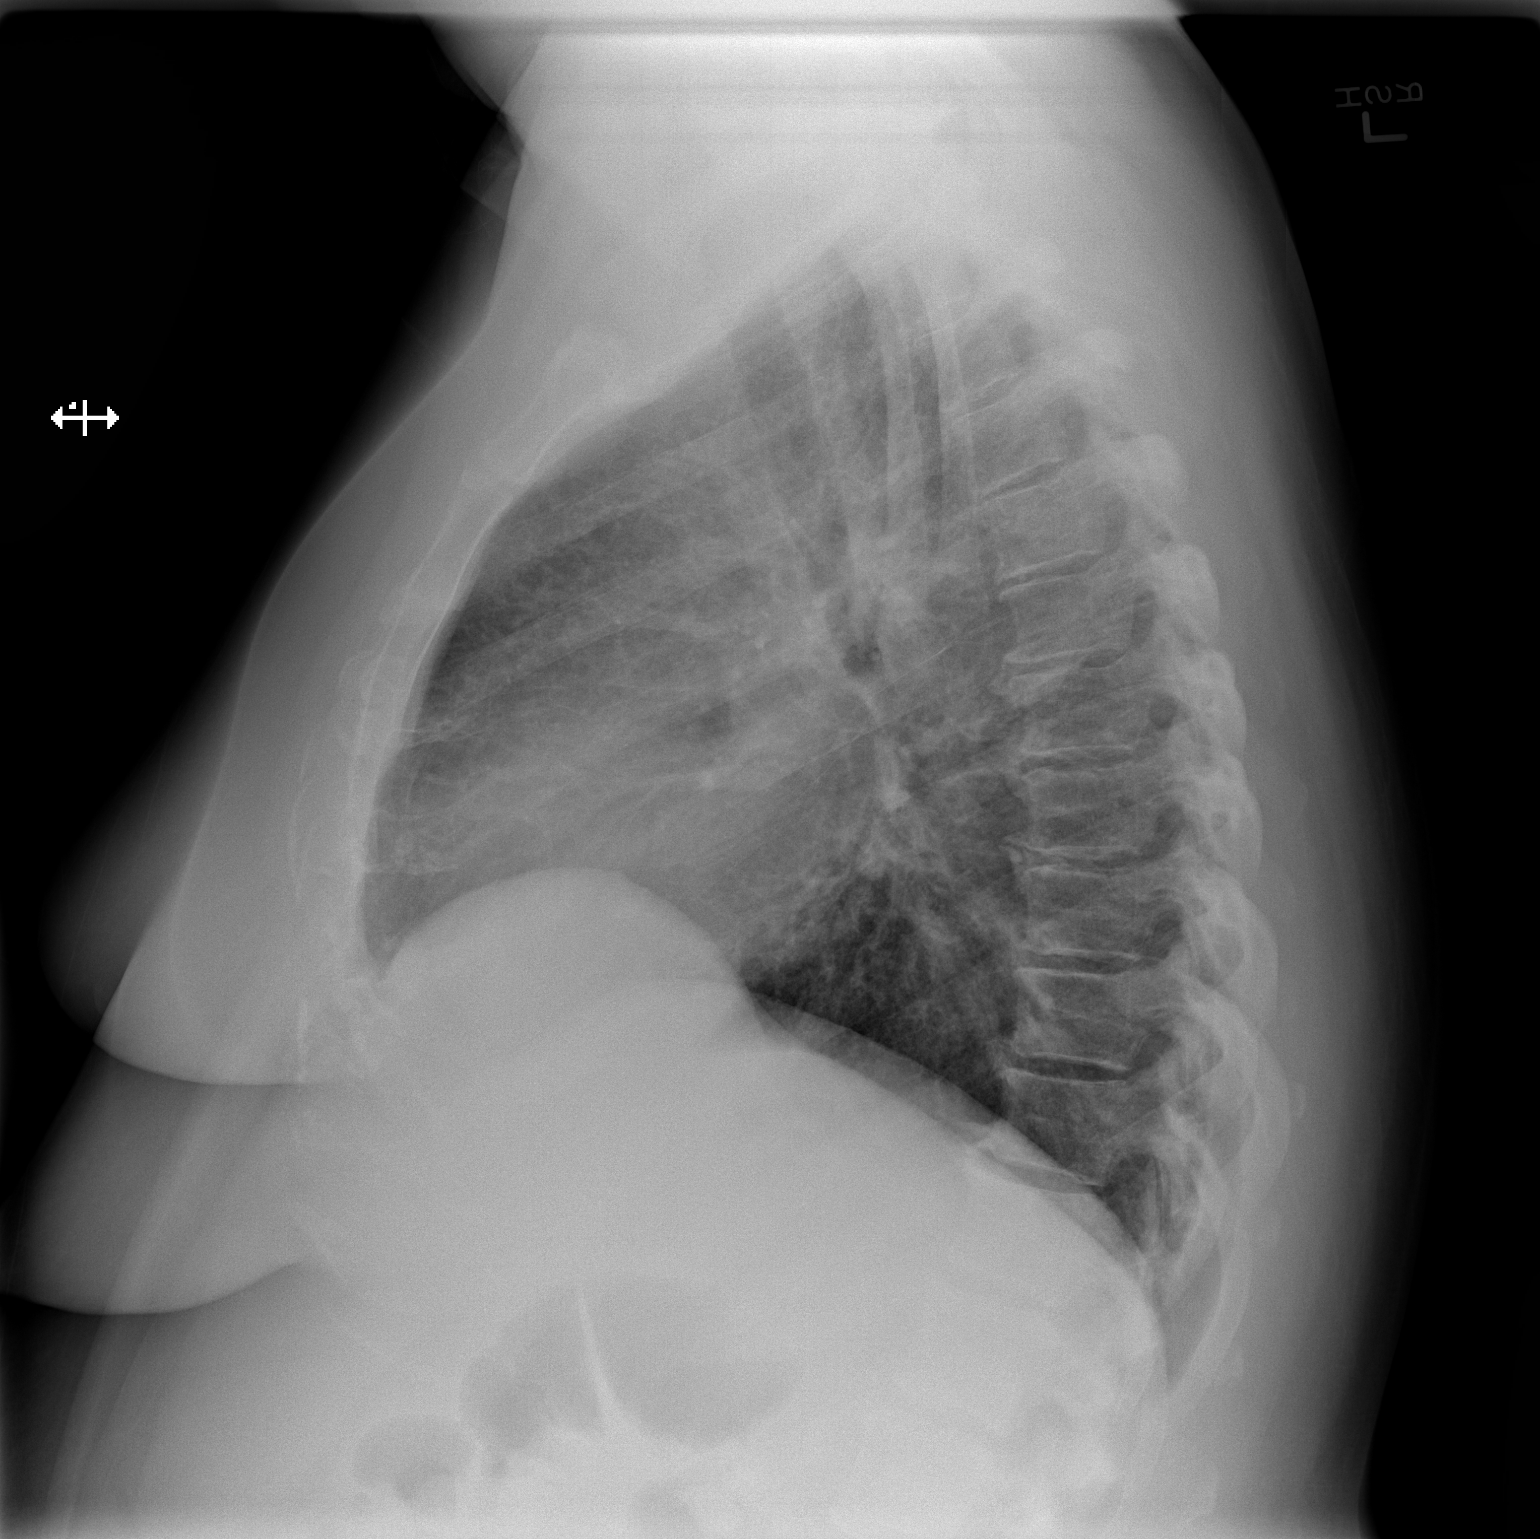

[2 of 2 positions shown; findings below may reference images not displayed]

FINDINGS: There is no edema or consolidation. The heart size and pulmonary
vascularity are normal. No adenopathy. There is stable eventration
of the right hemidiaphragm. There is degenerative change in the
thoracic spine.
IMPRESSION: No edema or consolidation. Eventration of the right hemidiaphragm
anteriorly on the right. There may be an underlying foramen of
Morgagni hernia, stable.

## 2014-08-19 ENCOUNTER — Other Ambulatory Visit: Payer: Self-pay | Admitting: *Deleted

## 2014-08-19 MED ORDER — ATORVASTATIN CALCIUM 80 MG PO TABS: 80.0000 mg | ORAL_TABLET | Freq: Every evening | ORAL | Status: DC

## 2014-08-21 ENCOUNTER — Other Ambulatory Visit: Payer: Self-pay | Admitting: *Deleted

## 2014-08-22 ENCOUNTER — Ambulatory Visit
Admission: RE | Admit: 2014-08-22 | Discharge: 2014-08-22 | Disposition: A | Discharge status: Home / Self Care | Payer: Medicare Other | Source: Ambulatory Visit | Attending: Family Medicine | Admitting: Family Medicine

## 2014-08-22 ENCOUNTER — Ambulatory Visit (INDEPENDENT_AMBULATORY_CARE_PROVIDER_SITE_OTHER): Payer: BLUE CROSS/BLUE SHIELD | Admitting: Family Medicine

## 2014-08-22 ENCOUNTER — Encounter: Payer: Self-pay | Admitting: Family Medicine

## 2014-08-22 VITALS — BP 140/74 | HR 114 | Temp 98.1°F | Ht 64.0 in | Wt 245.0 lb

## 2014-08-22 DIAGNOSIS — R0781 Pleurodynia: Secondary | ICD-10-CM | POA: Diagnosis not present

## 2014-08-22 DIAGNOSIS — R0689 Other abnormalities of breathing: Secondary | ICD-10-CM | POA: Diagnosis not present

## 2014-08-22 MED ORDER — TRAMADOL HCL 50 MG PO TABS: 50.0000 mg | ORAL_TABLET | Freq: Three times a day (TID) | ORAL | Status: DC | PRN

## 2014-08-22 MED ORDER — CYCLOBENZAPRINE HCL 10 MG PO TABS: 10.0000 mg | ORAL_TABLET | Freq: Three times a day (TID) | ORAL | Status: DC | PRN

## 2014-08-22 NOTE — Patient Instructions (Signed)
It was nice to see you today.  Go ahead and get your chest xray today.  I am treating you with Flexeril and Tramadol.  Follow up if you fail to improve or worsen.  Take care  Dr. Lacinda Axon

## 2014-08-22 NOTE — Assessment & Plan Note (Signed)
Likely MSK in origin. No signs of infection on exam. PE unlikely with Wells score of 1. Xray obtained today and was negative.  Will treat with Flexeril and Tramadol. Follow up instructions/return precautions given.

## 2014-08-22 NOTE — Progress Notes (Signed)
   Subjective:    Patient ID: [name], female    DOB: [birth year], 44 y.o.   MRN: [id number]  HPI 44 year old female with PMH of DM-2, Asthma/COPD, Seizure disorder, and Tobacco abuse presents for a same day appointment with complaints of right sided lower rib pain.  1) Rib pain - right  Pain is located in the right flank/right lower ribs  Has been present for the past 2 weeks.  Pain is worse with inspiration.  Pain is sharp in character and severe.  No shortness of breath. No recent fevers/chills.  No current chest pain.  Patient has taken ibuprofen with some improvement in pain.  Review of Systems Per HPI    Objective:   Physical Exam Filed Vitals:   08/22/14 1539  BP: 140/74  Pulse: 114  Temp: 98.1 F (36.7 C)   Exam: General: well appearing, NAD. Cardiovascular: RRR. No murmur. Respiratory: CTAB. No rales, rhonchi, or wheeze. Abdomen: R flank/lower ribs tender to palpation.     Assessment & Plan:  See problem list.

## 2014-09-16 DIAGNOSIS — E1122 Type 2 diabetes mellitus with diabetic chronic kidney disease: Secondary | ICD-10-CM | POA: Diagnosis not present

## 2014-09-16 DIAGNOSIS — E1142 Type 2 diabetes mellitus with diabetic polyneuropathy: Secondary | ICD-10-CM | POA: Diagnosis not present

## 2014-09-16 DIAGNOSIS — N182 Chronic kidney disease, stage 2 (mild): Secondary | ICD-10-CM | POA: Diagnosis not present

## 2014-09-16 DIAGNOSIS — K5900 Constipation, unspecified: Secondary | ICD-10-CM | POA: Diagnosis not present

## 2014-09-16 DIAGNOSIS — Z6841 Body Mass Index (BMI) 40.0 and over, adult: Secondary | ICD-10-CM | POA: Diagnosis not present

## 2014-10-21 ENCOUNTER — Other Ambulatory Visit: Payer: Self-pay | Admitting: *Deleted

## 2014-10-21 DIAGNOSIS — E1142 Type 2 diabetes mellitus with diabetic polyneuropathy: Secondary | ICD-10-CM

## 2014-10-21 MED ORDER — PREGABALIN 150 MG PO CAPS: 150.0000 mg | ORAL_CAPSULE | Freq: Two times a day (BID) | ORAL | Status: DC

## 2014-10-23 DIAGNOSIS — N182 Chronic kidney disease, stage 2 (mild): Secondary | ICD-10-CM | POA: Diagnosis not present

## 2014-10-23 DIAGNOSIS — Z6839 Body mass index (BMI) 39.0-39.9, adult: Secondary | ICD-10-CM | POA: Diagnosis not present

## 2014-10-23 DIAGNOSIS — Z23 Encounter for immunization: Secondary | ICD-10-CM | POA: Diagnosis not present

## 2014-10-23 DIAGNOSIS — E1122 Type 2 diabetes mellitus with diabetic chronic kidney disease: Secondary | ICD-10-CM | POA: Diagnosis not present

## 2014-10-23 DIAGNOSIS — E1142 Type 2 diabetes mellitus with diabetic polyneuropathy: Secondary | ICD-10-CM | POA: Diagnosis not present

## 2014-11-12 ENCOUNTER — Other Ambulatory Visit: Payer: Self-pay | Admitting: Family Medicine

## 2014-11-12 DIAGNOSIS — E1142 Type 2 diabetes mellitus with diabetic polyneuropathy: Secondary | ICD-10-CM

## 2014-11-12 NOTE — Telephone Encounter (Signed)
Needs office visit.

## 2014-11-12 NOTE — Telephone Encounter (Signed)
Would like a refill on Tramadol / thanks [employer], ASA

## 2014-11-12 NOTE — Telephone Encounter (Signed)
appt scheduled for 11-13-14. Jazmin Hartsell,CMA

## 2014-11-13 ENCOUNTER — Ambulatory Visit (INDEPENDENT_AMBULATORY_CARE_PROVIDER_SITE_OTHER): Payer: BLUE CROSS/BLUE SHIELD | Admitting: Family Medicine

## 2014-11-13 ENCOUNTER — Encounter: Payer: Self-pay | Admitting: Family Medicine

## 2014-11-13 VITALS — BP 130/70 | HR 95 | Temp 97.5°F | Ht 64.0 in | Wt 243.6 lb

## 2014-11-13 DIAGNOSIS — E1142 Type 2 diabetes mellitus with diabetic polyneuropathy: Secondary | ICD-10-CM | POA: Diagnosis not present

## 2014-11-13 DIAGNOSIS — E1165 Type 2 diabetes mellitus with hyperglycemia: Secondary | ICD-10-CM | POA: Diagnosis not present

## 2014-11-13 DIAGNOSIS — IMO0002 Reserved for concepts with insufficient information to code with codable children: Secondary | ICD-10-CM

## 2014-11-13 MED ORDER — TRAMADOL HCL 50 MG PO TABS: 100.0000 mg | ORAL_TABLET | Freq: Two times a day (BID) | ORAL | Status: DC | PRN

## 2014-11-13 NOTE — Progress Notes (Signed)
   Subjective:    Patient ID: [name], female    DOB: 1971-06-17, 44 y.o.   MRN: [id number]  HPI Pt requesting refill of Tramadol for peripheral neuropathy pain Pt has been taking for a long time (+) constipation, no seizure or convulsions or Tics Improves pain in feet Takes one tablet twice a day, occasionally one extra tablet a day if she is on her feet a lot.    Review of Systems No Seizure (+) constipation    Objective:   Physical Exam NAD       Assessment & Plan:

## 2014-11-13 NOTE — Assessment & Plan Note (Signed)
Established problem Stable Tolerating tramadol with lyrica for p. Neuropathy pain in fet. Tramadol is helpful in reducing pain.  Refill of tramadol provided.

## 2014-11-13 NOTE — Patient Instructions (Signed)
I am glad your tramadol helps some with your neuropathy pain and restless leg syndrome.  Consider taking Miralax powder, one capful, daily to prevent bad constipation.

## 2014-11-14 ENCOUNTER — Other Ambulatory Visit: Payer: Self-pay | Admitting: *Deleted

## 2014-11-14 MED ORDER — ATORVASTATIN CALCIUM 80 MG PO TABS: 80.0000 mg | ORAL_TABLET | Freq: Every evening | ORAL | Status: DC

## 2014-11-26 ENCOUNTER — Encounter: Payer: Self-pay | Admitting: Family Medicine

## 2014-11-26 ENCOUNTER — Ambulatory Visit (INDEPENDENT_AMBULATORY_CARE_PROVIDER_SITE_OTHER): Payer: BLUE CROSS/BLUE SHIELD | Admitting: Family Medicine

## 2014-11-26 VITALS — BP 168/75 | HR 96 | Temp 98.2°F | Ht 66.0 in | Wt 245.4 lb

## 2014-11-26 DIAGNOSIS — R1900 Intra-abdominal and pelvic swelling, mass and lump, unspecified site: Secondary | ICD-10-CM | POA: Diagnosis not present

## 2014-11-26 DIAGNOSIS — K4090 Unilateral inguinal hernia, without obstruction or gangrene, not specified as recurrent: Secondary | ICD-10-CM

## 2014-11-26 NOTE — Progress Notes (Addendum)
Patient ID: [name], female   DOB: [birth year], 44 y.o.   MRN: [id number] Subjective:   CC: Groin bulge and pain  HPI:   Patient presents for same-day evaluation for 1 week of noticing a bulge in her left groin that feels like it is enlarging now to the size of a 50c piece and is painful to the touch or if she moves the wrong way. She has not noticed this before. Pain radiates up to abdomen. Tylenol and advil do not help. She is not having trouble walking and is eating normally. She has been lifting light weights lately but has not been straining or lifting heavy items. Denies fevers, chills, change in BM, redness, purulence/drainage, skin findings, similar bulge elsewhere, abnormal vaginal bleeding, nausea, vomiting, change in PO, or other concerns. She has not been sexually active in 1.5 years and denies discharge or abnormal vaginal bleeding. No dysuria.  Review of Systems - Per HPI.   PMH - tobacco dependence, history of TIA, sleep apnea, seizure disorder, obesity, hypertriglyceridemia, elevated blood pressure, diabetes type 2, diabetic polyneuropathy, COPD, bipolar disorder, persistent asthma, aortic valve disorder, anxiety, allergic rhinitis     Objective:  Physical Exam BP 168/75 mmHg  Pulse 96  Temp(Src) 98.2 F (36.8 C) (Oral)  Ht 5\' 6"  (1.676 m)  Wt 245 lb 6 oz (111.301 kg)  BMI 39.62 kg/m2  LMP 11/11/2011 GEN: NAD CV: RRR PULM: CTAB, normal effort ABD/PELVIS: Large pannus, left inguinal region with 2-3cm bulge mildly tender palpated and easily reducible, difficult to palpate defect given habitus, no skin changes    Assessment:     [name] is a 44 y.o. female here for painful bulge in left groin.    Plan:     # See problem list and after visit summary for problem-specific plans. - F/u for BP with PCP  # Health Maintenance: Not discussed  Follow-up: Follow up PRN for worsening of symptoms.   Hilton Sinclair, MD [place]

## 2014-11-26 NOTE — Patient Instructions (Signed)
I have placed a referral for general surgery to evaluate hernia. You can take Tylenol and use heat as needed for pain. Keep the reasons for emergency evaluation in the back of your mind: If you have severe worsened abdominal pain, fevers, chills, nausea or vomiting, or other concerns.  Hernia A hernia happens when an organ inside your body pushes out through a weak spot in your belly (abdominal) wall. Most hernias get worse over time. They can often be pushed back into place (reduced). Surgery may be needed to repair hernias that cannot be pushed into place. HOME CARE  Keep doing normal activities.  Avoid lifting more than 10 pounds (4.5 kilograms).  Cough gently and avoid straining. Over time, these things will:  Increase your hernia size.  Irritate your hernia.  Break down hernia repairs.  Stop smoking.  Do not wear anything tight over your hernia. Do not keep the hernia in with an outside bandage.  Eat food that is high in fiber (fruit, vegetables, whole grains).  Drink enough fluids to keep your pee (urine) clear or pale yellow.  Take medicines to make your poop soft (stool softeners) if you cannot poop (constipated). GET HELP RIGHT AWAY IF:   You have a fever.  You have belly pain that gets worse.  You feel sick to your stomach (nauseous) and throw up (vomit).  Your skin starts to bulge out.  Your hernia turns a different color, feels hard, or is tender.  You have increased pain or puffiness (swelling) around the hernia.  You poop more or less often.  Your poop does not look the way normally does.  You have watery poop (diarrhea).  You cannot push the hernia back in place by applying gentle pressure while lying down. MAKE SURE YOU:   Understand these instructions.  Will watch your condition.  Will get help right away if you are not doing well or get worse. Document Released: 12/15/2009 Document Revised: 09/19/2011 Document Reviewed: 12/15/2009 [facility]  Patient Information 2015 [place], [place]. This information is not intended to replace advice given to you by your health care provider. Make sure you discuss any questions you have with your health care provider.

## 2014-11-27 DIAGNOSIS — K4090 Unilateral inguinal hernia, without obstruction or gangrene, not specified as recurrent: Secondary | ICD-10-CM | POA: Insufficient documentation

## 2014-11-27 NOTE — Assessment & Plan Note (Addendum)
Left bulge likely indirect inguinal hernia, does not appear strangulated and easily reducible in clinic but painful to patient. Exam not consistent with lymphadenitis and low concern for STD or pregnancy per pt who defers testing. -Referral to surgery discussed, pt opts for this so referral placed. -Tylenol/heat PRN pain. -Emergency reasons for eval discussed (severe pain, fevers, chills, skin changes, vomiting, other major concerns).

## 2014-12-01 ENCOUNTER — Telehealth: Payer: Self-pay | Admitting: Family Medicine

## 2014-12-01 NOTE — Telephone Encounter (Signed)
Patient is calling back and states that nothing can be done for her until June 8th. She would like to know what other options are available. Please contact the patient with this information. Thank you, Fonda Kinder, ASA

## 2014-12-01 NOTE — Telephone Encounter (Signed)
Patient is calling back and states that nothing can be done for her until June 8th. She would like to know what other options are available. Please contact the patient with this information. Thank you, [name], ASA

## 2014-12-01 NOTE — Telephone Encounter (Signed)
Patient had some discomfort over the weekend and her appt for surgery is not until June 8th and wants to know what can be done about this. Will forward to PCP for advice. Rowan Pollman, CMA.

## 2014-12-01 NOTE — Telephone Encounter (Signed)
Mrs. Lacks is inquiring about the referral for a surgeon to remove her hernia.  Had a lot of discomfort over the weekend.

## 2014-12-01 NOTE — Telephone Encounter (Signed)
LMTCB with pt relative as pt was asleep, will try again later. If pt calls back please let her know that referral forms were faxed over and [facility] will contact her with appt.  Yoni Lobos, CMA.

## 2014-12-02 ENCOUNTER — Telehealth: Payer: Self-pay | Admitting: Family Medicine

## 2014-12-02 NOTE — Telephone Encounter (Signed)
Done.  Discussed with patient that she should continue her current regimen with Tylenol and Tramadol PRN.  She has no nausea, vomiting, fevers, chills.  She is tolerating PO well.  Just reporting that pain medications are not lasting long enough.  Encouraged to continue medications as prescribed and avoid overexerting herself.  No lifting, etc.  Also, reviewed reasons to be seen in ED again.  She voiced good understanding and appreciation for call.  She will follow up with surgery as scheduled.

## 2014-12-02 NOTE — Telephone Encounter (Signed)
Pt called and said that she would like to speak to a nurse concerning her hernia. She said that it has gotten worse. jw

## 2014-12-03 NOTE — Telephone Encounter (Signed)
Spoke with pt.  The pain is worse.  Advised that she will either need to call Surgeon to see if appt can be moved up or come here, but reminded of note from MD yesterday.  She will call surgeon and call us tomorrow if she is still hurting. Zahari Xiang, Salome Spotted

## 2014-12-04 NOTE — Telephone Encounter (Signed)
Tried to call patient to check in.  No answer.  Thank you for speaking to her yesterday.  I agree that calling surgeon for sooner appointment is appropriate.  If pain is severe, decreased PO, nausea or vomiting, she should go to ED.

## 2014-12-24 ENCOUNTER — Ambulatory Visit (HOSPITAL_COMMUNITY)
Admission: RE | Admit: 2014-12-24 | Discharge: 2014-12-24 | Disposition: A | Discharge status: Home / Self Care | Payer: BLUE CROSS/BLUE SHIELD | Source: Ambulatory Visit | Attending: Family Medicine | Admitting: Family Medicine

## 2014-12-24 ENCOUNTER — Ambulatory Visit (INDEPENDENT_AMBULATORY_CARE_PROVIDER_SITE_OTHER): Payer: BLUE CROSS/BLUE SHIELD | Admitting: Family Medicine

## 2014-12-24 ENCOUNTER — Encounter: Payer: Self-pay | Admitting: Family Medicine

## 2014-12-24 ENCOUNTER — Telehealth: Payer: Self-pay | Admitting: Family Medicine

## 2014-12-24 VITALS — BP 148/62 | HR 97 | Temp 98.2°F | Ht 66.0 in | Wt 248.0 lb

## 2014-12-24 DIAGNOSIS — R079 Chest pain, unspecified: Secondary | ICD-10-CM

## 2014-12-24 DIAGNOSIS — R05 Cough: Secondary | ICD-10-CM | POA: Insufficient documentation

## 2014-12-24 DIAGNOSIS — J441 Chronic obstructive pulmonary disease with (acute) exacerbation: Secondary | ICD-10-CM | POA: Insufficient documentation

## 2014-12-24 LAB — CBC
HCT: 40 % (ref 36.0–46.0)
Hemoglobin: 12.9 g/dL (ref 12.0–15.0)
MCH: 28.2 pg (ref 26.0–34.0)
MCHC: 32.3 g/dL (ref 30.0–36.0)
MCV: 87.5 fL (ref 78.0–100.0)
Platelets: 238 10*3/uL (ref 150–400)
RBC: 4.57 MIL/uL (ref 3.87–5.11)
RDW: 15.5 % (ref 11.5–15.5)
WBC: 10.3 10*3/uL (ref 4.0–10.5)

## 2014-12-24 LAB — D-DIMER, QUANTITATIVE: D-Dimer, Quant: 0.32 ug/mL-FEU (ref 0.00–0.48)

## 2014-12-24 MED ORDER — PREDNISONE 50 MG PO TABS: 50.0000 mg | ORAL_TABLET | Freq: Every day | ORAL | Status: DC

## 2014-12-24 MED ORDER — DOXYCYCLINE HYCLATE 100 MG PO TABS: 100.0000 mg | ORAL_TABLET | Freq: Two times a day (BID) | ORAL | Status: DC

## 2014-12-24 NOTE — Assessment & Plan Note (Signed)
Patient with symptoms consistent with COPD exacerbation, though hemoptysis would not be classic of this presentation. Could be that hemoptysis is not true hemoptysis and is from her nose bleeds, though with history of DVT and shortness of breath one would worry about PE as a cause of her symptoms. Her O2 sat is normal and heart rate is in normal range and she is not in respiratory distress at this time making PE less likely. Vitals are stable. Could also represent PNA, though patient with no fever. Doubt cardiac cause of pain with EKG with stable T wave and ST segments. Will check D-dimer to evaluate for PE given wells score of 2.5. Will check CBC given hemoptysis. Will check CXR. Treat with prednisone and doxycycline. Given return precautions. Precepted with Dr Mingo Amber.

## 2014-12-24 NOTE — Patient Instructions (Addendum)
Nice to see you. We are going to treat you for a COPD exacerbation with antibiotics and steroids.  Please go get the chest x-ray. If you develop chest pain, shortness of breath, cough up more blood, or develop fever please seek medical attention.   Chronic Obstructive Pulmonary Disease Chronic obstructive pulmonary disease (COPD) is a common lung condition in which airflow from the lungs is limited. COPD is a general term that can be used to describe many different lung problems that limit airflow, including both chronic bronchitis and emphysema. If you have COPD, your lung function will probably never return to normal, but there are measures you can take to improve lung function and make yourself feel better.  CAUSES   Smoking (common).   Exposure to secondhand smoke.   Genetic problems.  Chronic inflammatory lung diseases or recurrent infections. SYMPTOMS   Shortness of breath, especially with physical activity.   Deep, persistent (chronic) cough with a large amount of thick mucus.   Wheezing.   Rapid breaths (tachypnea).   Gray or bluish discoloration (cyanosis) of the skin, especially in fingers, toes, or lips.   Fatigue.   Weight loss.   Frequent infections or episodes when breathing symptoms become much worse (exacerbations).   Chest tightness. DIAGNOSIS  Your health care provider will take a medical history and perform a physical examination to make the initial diagnosis. Additional tests for COPD may include:   Lung (pulmonary) function tests.  Chest X-ray.  CT scan.  Blood tests. TREATMENT  Treatment available to help you feel better when you have COPD includes:   Inhaler and nebulizer medicines. These help manage the symptoms of COPD and make your breathing more comfortable.  Supplemental oxygen. Supplemental oxygen is only helpful if you have a low oxygen level in your blood.   Exercise and physical activity. These are beneficial for nearly  all people with COPD. Some people may also benefit from a pulmonary rehabilitation program. HOME CARE INSTRUCTIONS   Take all medicines (inhaled or pills) as directed by your health care provider.  Avoid over-the-counter medicines or cough syrups that dry up your airway (such as antihistamines) and slow down the elimination of secretions unless instructed otherwise by your health care provider.   If you are a smoker, the most important thing that you can do is stop smoking. Continuing to smoke will cause further lung damage and breathing trouble. Ask your health care provider for help with quitting smoking. He or she can direct you to community resources or hospitals that provide support.  Avoid exposure to irritants such as smoke, chemicals, and fumes that aggravate your breathing.  Use oxygen therapy and pulmonary rehabilitation if directed by your health care provider. If you require home oxygen therapy, ask your health care provider whether you should purchase a pulse oximeter to measure your oxygen level at home.   Avoid contact with individuals who have a contagious illness.  Avoid extreme temperature and humidity changes.  Eat healthy foods. Eating smaller, more frequent meals and resting before meals may help you maintain your strength.  Stay active, but balance activity with periods of rest. Exercise and physical activity will help you maintain your ability to do things you want to do.  Preventing infection and hospitalization is very important when you have COPD. Make sure to receive all the vaccines your health care provider recommends, especially the pneumococcal and influenza vaccines. Ask your health care provider whether you need a pneumonia vaccine.  Learn and use relaxation  techniques to manage stress.  Learn and use controlled breathing techniques as directed by your health care provider. Controlled breathing techniques include:   Pursed lip breathing. Start by  breathing in (inhaling) through your nose for 1 second. Then, purse your lips as if you were going to whistle and breathe out (exhale) through the pursed lips for 2 seconds.   Diaphragmatic breathing. Start by putting one hand on your abdomen just above your waist. Inhale slowly through your nose. The hand on your abdomen should move out. Then purse your lips and exhale slowly. You should be able to feel the hand on your abdomen moving in as you exhale.   Learn and use controlled coughing to clear mucus from your lungs. Controlled coughing is a series of short, progressive coughs. The steps of controlled coughing are:  1. Lean your head slightly forward.  2. Breathe in deeply using diaphragmatic breathing.  3. Try to hold your breath for 3 seconds.  4. Keep your mouth slightly open while coughing twice.  5. Spit any mucus out into a tissue.  6. Rest and repeat the steps once or twice as needed. SEEK MEDICAL CARE IF:   You are coughing up more mucus than usual.   There is a change in the color or thickness of your mucus.   Your breathing is more labored than usual.   Your breathing is faster than usual.  SEEK IMMEDIATE MEDICAL CARE IF:   You have shortness of breath while you are resting.   You have shortness of breath that prevents you from:  Being able to talk.   Performing your usual physical activities.   You have chest pain lasting longer than 5 minutes.   Your skin color is more cyanotic than usual.  You measure low oxygen saturations for longer than 5 minutes with a pulse oximeter. MAKE SURE YOU:   Understand these instructions.  Will watch your condition.  Will get help right away if you are not doing well or get worse. Document Released: 04/06/2005 Document Revised: 11/11/2013 Document Reviewed: 02/21/2013 [facility] Patient Information 2015 [place], [place]. This information is not intended to replace advice given to you by your health care provider.  Make sure you discuss any questions you have with your health care provider.

## 2014-12-24 NOTE — Progress Notes (Signed)
Patient ID: [name], female   DOB: [birth year], 44 y.o.   MRN: [id number]  Tommi Rumps, MD Phone: [phone]  Kenyotta Dorfman is a 44 y.o. female who presents today for same day appointment.   Patient presents with one week of cough initially productive of yellow sputum though has developed some hemoptysis with this in coughing up blood mixed with sputum. Also notes nose bleeds with this. Notes that she has developed right sided sharp chest pain during the cough initially, though now it feels raw persistently with worsening during cough and minimally worsens with exertion. Notes a history of DVT in the past and states was treated for several weeks with anticoagulant. Denies recent trips, surgeries, and history of cancer. Had cardiac cath last year with mild disease in LAD and mild to moderate disease in mid RCA.  PMH: smoker.   ROS: Per HPI   Physical Exam Filed Vitals:   12/24/14 1536  BP: 148/62  Pulse: 97  Temp: 98.2 F (36.8 C)  O2 sat 95%  Gen: Well NAD HEENT: PERRL,  MMM, no blood seen Lungs: CTABL Nl WOB Heart: RRR, no murmur appreciated  MSK: right side of chest tender to palpation over costochondral joints Exts: Non edematous BL  LE, warm and well perfused. No calf swelling or tenderness. Negative homans.   EKG: NSR, rate 93, RBBB, T wave inversion V1, no change in T wave or ST segments from prior EKG  Assessment/Plan: Please see individual problem list.  Tommi Rumps, MD [place] PGY-3

## 2014-12-24 NOTE — Telephone Encounter (Signed)
Called patient and advised of negative d-dimer and normal CBC. Advised we are still waiting on the official CXR read though there does not appear to be a PNA. Advised that this is likely related to a COPD exacerbation and she should complete the treatment course we discussed. Advised of return precautions. Will contact patient once CXR has been read.

## 2014-12-26 ENCOUNTER — Encounter: Payer: Self-pay | Admitting: *Deleted

## 2014-12-26 ENCOUNTER — Telehealth: Payer: Self-pay | Admitting: *Deleted

## 2014-12-26 NOTE — Telephone Encounter (Signed)
Normal results sent through patient's mychart. Pasqualina Colasurdo,CMA

## 2014-12-26 NOTE — Telephone Encounter (Signed)
-----   Message from Leone Haven, MD sent at 12/25/2014  5:08 PM EDT ----- Please call patient and inform that her CXR is normal. Thanks.

## 2014-12-30 ENCOUNTER — Encounter: Payer: Self-pay | Admitting: Family Medicine

## 2014-12-30 ENCOUNTER — Telehealth: Payer: Self-pay | Admitting: Family Medicine

## 2014-12-30 NOTE — Telephone Encounter (Signed)
[name] is calling because in order for Dr. Ralene Ok with [facility] to perform surgery for her hernia, she needs a referral or authorization from her PCP. That address is Address: [address], [place],  [address]; [phone]. Thank you, Fonda Kinder, ASA

## 2014-12-31 NOTE — Telephone Encounter (Signed)
Also, please advise patient to schedule an appointment with me for ambulatory O2, COPD follow up so that we can reorder her Oxygen and supplies.  The order from [facility] is requiring recent O2 saturation levels.

## 2014-12-31 NOTE — Telephone Encounter (Signed)
Apparently, seen by [facility] cardiologist this month and was told everything looked good.  Patient to call to have records sent over.  I will call for clearance after reviewing these results.  Please also work on getting copies of these records.

## 2015-01-01 ENCOUNTER — Other Ambulatory Visit: Payer: Self-pay | Admitting: *Deleted

## 2015-01-01 MED ORDER — ATORVASTATIN CALCIUM 80 MG PO TABS: 80.0000 mg | ORAL_TABLET | Freq: Every evening | ORAL | Status: DC

## 2015-01-01 NOTE — Telephone Encounter (Signed)
Will RF Lipitor x1.  Needs yearly physical with labs.  Please have patient schedule.

## 2015-01-02 ENCOUNTER — Encounter: Payer: Self-pay | Admitting: Interventional Cardiology

## 2015-01-02 ENCOUNTER — Ambulatory Visit (INDEPENDENT_AMBULATORY_CARE_PROVIDER_SITE_OTHER): Payer: BLUE CROSS/BLUE SHIELD | Admitting: Interventional Cardiology

## 2015-01-02 VITALS — BP 120/60 | HR 105 | Ht 65.0 in | Wt 248.0 lb

## 2015-01-02 DIAGNOSIS — Z720 Tobacco use: Secondary | ICD-10-CM | POA: Diagnosis not present

## 2015-01-02 DIAGNOSIS — I10 Essential (primary) hypertension: Secondary | ICD-10-CM

## 2015-01-02 DIAGNOSIS — IMO0001 Reserved for inherently not codable concepts without codable children: Secondary | ICD-10-CM

## 2015-01-02 DIAGNOSIS — E785 Hyperlipidemia, unspecified: Secondary | ICD-10-CM

## 2015-01-02 DIAGNOSIS — Z01810 Encounter for preprocedural cardiovascular examination: Secondary | ICD-10-CM

## 2015-01-02 DIAGNOSIS — I152 Hypertension secondary to endocrine disorders: Secondary | ICD-10-CM | POA: Insufficient documentation

## 2015-01-02 DIAGNOSIS — R030 Elevated blood-pressure reading, without diagnosis of hypertension: Secondary | ICD-10-CM

## 2015-01-02 DIAGNOSIS — E1159 Type 2 diabetes mellitus with other circulatory complications: Secondary | ICD-10-CM | POA: Insufficient documentation

## 2015-01-02 DIAGNOSIS — F17200 Nicotine dependence, unspecified, uncomplicated: Secondary | ICD-10-CM

## 2015-01-02 NOTE — Telephone Encounter (Signed)
Just got off the phone with her surgeon's office.  They need CARDIAC clearance from a Cardiologist.  In other words, cardiology should call their office to clear.  Please advise patient.  Will place forms in Dr [employer] box.  I appreciate his assistance with this.

## 2015-01-02 NOTE — Patient Instructions (Signed)
Medication Instructions:  Same-no change  Labwork: None  Testing/Procedures: None  Follow-Up: Your physician wants you to follow-up in: 1 year. You will receive a reminder letter in the mail two months in advance. If you don't receive a letter, please call our office to schedule the follow-up appointment.      

## 2015-01-02 NOTE — Telephone Encounter (Signed)
Spoke with patient and she is aware of this.  Will send this message to Dr. Lacinda Axon to make him aware of patient's appt for O2 saturation. Yuli Lanigan,CMA

## 2015-01-02 NOTE — Progress Notes (Signed)
Patient ID: [name], female   DOB: Apr 10, 1971, 44 y.o.   MRN: [id number]     Cardiology Office Note   Date:  01/02/2015   ID:  [name], DOB 1971-04-18, MRN [id number]  PCP:  Ronnie Doss, DO    No chief complaint on file.  preoperative evaluation   Wt Readings from Last 3 Encounters:  01/02/15 248 lb (112.492 kg)  12/24/14 248 lb (112.492 kg)  11/26/14 245 lb 6 oz (111.301 kg)       History of Present Illness: [name] is a 44 y.o. female  who has had mild to moderate coronary artery disease in the past. She was evaluated in 2015 with a cardiac cath and a transesophageal echocardiogram. She has not been having any cardiac symptoms. Unfortunately, she developed a hernia after lifting some furniture. She now requires surgery.  She had been walking regularly without any chest discomfort or shortness of breath. She denies any trouble lying flat.  Signed for cath in 2015, she had mild CAD with normal left jugular systolic function.    Past Medical History  Diagnosis Date  . Asthma   . COPD (chronic obstructive pulmonary disease)   . Diabetes mellitus   . Renal disorder   . Hypertension   . Seizures   . Syncope   . Bipolar 1 disorder   . DVT (deep venous thrombosis)     2011- was on coumadin for a year  . Stroke     2010  . GERD (gastroesophageal reflux disease)   . Hyperlipidemia     Past Surgical History  Procedure Laterality Date  . Laparoscopy      Endometriosis  . Tee without cardioversion N/A 11/28/2013    Procedure: TRANSESOPHAGEAL ECHOCARDIOGRAM (TEE);  Surgeon: Thayer Headings, MD;  Location: [facility];  Service: Cardiovascular;  Laterality: N/A;  . Left heart catheterization with coronary angiogram N/A 12/05/2013    Procedure: LEFT HEART CATHETERIZATION WITH CORONARY ANGIOGRAM;  Surgeon: Jettie Booze, MD;  Location: [facility] CATH LAB;  Service: Cardiovascular;  Laterality: N/A;     Current Outpatient Prescriptions  Medication Sig  Dispense Refill  . albuterol (PROVENTIL HFA;VENTOLIN HFA) 108 (90 BASE) MCG/ACT inhaler Inhale 2 puffs into the lungs every 4 (four) hours as needed. For shortness of breath 1 Inhaler 2  . aspirin EC 81 MG tablet Take 81 mg by mouth every morning.    Marland Kitchen atorvastatin (LIPITOR) 80 MG tablet Take 1 tablet (80 mg total) by mouth every evening. Replacement for Crestor.  Needs office visit 30 tablet 0  . Canagliflozin (INVOKANA) 300 MG TABS Take 300 mg by mouth daily.    . clonazePAM (KLONOPIN) 1 MG tablet Take 1 mg by mouth 2 (two) times daily.    . colesevelam (WELCHOL) 625 MG tablet Take 1,250 mg by mouth 2 (two) times daily with a meal.    . cyclobenzaprine (FLEXERIL) 10 MG tablet Take 1 tablet (10 mg total) by mouth 3 (three) times daily as needed for muscle spasms. 30 tablet 0  . doxycycline (VIBRA-TABS) 100 MG tablet Take 1 tablet (100 mg total) by mouth 2 (two) times daily. 20 tablet 0  . famotidine (PEPCID) 20 MG tablet Take 20 mg by mouth 2 (two) times daily.    Marland Kitchen FLUoxetine (PROZAC) 40 MG capsule Take 40 mg by mouth daily.     . Fluticasone-Salmeterol (ADVAIR DISKUS) 500-50 MCG/DOSE AEPB Inhale 1 puff into the lungs daily. 60 each   . furosemide (LASIX) 40 MG  tablet Take 1 tablet (40 mg total) by mouth daily. 30 tablet 6  . ibuprofen (ADVIL,MOTRIN) 200 MG tablet Take 600 mg by mouth 2 (two) times daily as needed.    . insulin regular human CONCENTRATED (HUMULIN R) 500 UNIT/ML SOLN injection Inject 50 Units into the skin.     Marland Kitchen isosorbide mononitrate (IMDUR) 15 mg TB24 24 hr tablet Take 0.5 tablets (15 mg total) by mouth daily. 30 tablet 1  . lamoTRIgine (LAMICTAL) 200 MG tablet Take 200 mg by mouth at bedtime.    . Liraglutide (VICTOZA) 18 MG/3ML SOPN Inject 1.8 mg into the skin daily.     Marland Kitchen lisinopril (PRINIVIL,ZESTRIL) 10 MG tablet Take 1 tablet (10 mg total) by mouth at bedtime. 90 tablet 1  . loratadine (CLARITIN) 10 MG tablet Take 1 tablet (10 mg total) by mouth daily. 30 tablet 11  .  polyethylene glycol powder (GLYCOLAX/MIRALAX) powder Take 1 Container by mouth once.    . predniSONE (DELTASONE) 50 MG tablet Take 1 tablet (50 mg total) by mouth daily with breakfast. 5 tablet 0  . pregabalin (LYRICA) 150 MG capsule Take 1 capsule (150 mg total) by mouth 2 (two) times daily. 60 capsule 5  . QUEtiapine (SEROQUEL XR) 400 MG 24 hr tablet Take 800 mg by mouth at bedtime.    . Tiotropium Bromide Monohydrate (SPIRIVA RESPIMAT) 2.5 MCG/ACT AERS Inhale 2 puffs into the lungs daily. 1 Inhaler 3  . traMADol (ULTRAM) 50 MG tablet Take 2 tablets (100 mg total) by mouth every 12 (twelve) hours as needed. 120 tablet 3   No current facility-administered medications for this visit.    Allergies:   Metoprolol; Montelukast sodium; Morphine sulfate; and Penicillins    Social History:  The patient  reports that she has been smoking Cigarettes.  She has a 20 pack-year smoking history. She has never used smokeless tobacco. She reports that she does not drink alcohol or use illicit drugs.   Family History:  The patient's family history includes Asthma in her father and sister; Coronary artery disease in her mother; Diabetes in her father and mother; Diabetes type II in her brother; Hypertension in her mother; Other in her brother; Venous thrombosis in her brother.    ROS:  Please see the history of present illness.   Otherwise, review of systems are positive for hernia pain, cutting back on cigarettes.   All other systems are reviewed and negative.    PHYSICAL EXAM: VS:  BP 120/60 mmHg  Pulse 105  Ht 5\' 5"  (1.651 m)  Wt 248 lb (112.492 kg)  BMI 41.27 kg/m2  LMP 11/11/2011 , BMI Body mass index is 41.27 kg/(m^2). GEN: Well nourished, well developed, in no acute distress HEENT: normal Neck: no JVD, carotid bruits, or masses Cardiac: RRR; no murmurs, rubs, or gallops,no edema  Respiratory:  clear to auscultation bilaterally, normal work of breathing GI: soft, nontender, nondistended, +  BS MS: no deformity or atrophy Skin: warm and dry, no rash Neuro:  Strength and sensation are intact Psych: euthymic mood, full affect   EKG:   The ekg ordered today demonstrates sinus tachycardia, incomplete right bundle branch block   Recent Labs: 12/24/2014: Hemoglobin 12.9; Platelets 238   Lipid Panel    Component Value Date/Time   CHOL 204* 02/04/2013 1003   TRIG 658* 02/04/2013 1003   HDL 34* 02/04/2013 1003   CHOLHDL 6.0 02/04/2013 1003   VLDL NOT CALC 02/04/2013 1003   LDLCALC NOT CALC 02/04/2013 1003  LDLDIRECT 145* 04/30/2009 2013     Other studies Reviewed: Additional studies/ records that were reviewed today with results demonstrating: TEE and cardiac cath reviewed. Calcific density that was seen on TTE was not significant by TEE. Cardiac cath showed nonobstructive coronary artery disease.   ASSESSMENT AND PLAN:  1. Preoperative cardiovascular evaluation: She is at low to intermediate risk for cardiac complication, 6-3%. The biggest modifiable risk factor would be for her to stop smoking prior to surgery. No further cardiac testing is required before surgery.  2. Coronary artery disease: She needs aggressive secondary prevention. She needs to stop smoking. Her cardiac symptoms are controlled with her current medications. 3. Hypertension: Blood pressure control. Continue current medicines. 4. Tobacco abuse: The patient was encouraged to stop smoking. 5. Hyperlipidemia: Followed by primary care doctor.   Current medicines are reviewed at length with the patient today.  The patient concerns regarding her medicines were addressed.  The following changes have been made: Continue current medicines.  Labs/ tests ordered today include:  No orders of the defined types were placed in this encounter.    Recommend 150 minutes/week of aerobic exercise Low fat, low carb, high fiber diet recommended  Disposition:   FU in one year   Teresita Madura., MD   01/02/2015 4:29 PM    [facility] [address], [place], [place]  [address] Phone: [phone]; Fax: [phone]

## 2015-01-02 NOTE — Telephone Encounter (Signed)
Spoke with patient and scheduled her to see Dr. Lacinda Axon on 01-07-2015 for her forms.  If you could please put these in his box so he will have them for the visit. Also patient's last visit with cards was in may of 2015.  She would like to go ahead and get clearance for surgery but I informed her that she may need to see cards again before you can give the ok.  Advised patient that i would check with pcp and let her know. Phuong Moffatt,CMA

## 2015-01-07 ENCOUNTER — Ambulatory Visit (INDEPENDENT_AMBULATORY_CARE_PROVIDER_SITE_OTHER): Payer: BLUE CROSS/BLUE SHIELD | Admitting: *Deleted

## 2015-01-07 ENCOUNTER — Ambulatory Visit: Payer: Medicare Other | Admitting: Family Medicine

## 2015-01-07 VITALS — BP 118/68 | HR 110

## 2015-01-07 DIAGNOSIS — J441 Chronic obstructive pulmonary disease with (acute) exacerbation: Secondary | ICD-10-CM | POA: Diagnosis not present

## 2015-01-07 NOTE — Progress Notes (Signed)
   Pt in nurse clinic to complete form for oxygen therapy for [employer].  Verbal order given by Dr. Lacinda Axon to complete oxygen saturation level on room air.  Pt ambulated twice around clinic; oxygen saturation level dropped to 90% from 94% resting.  Pt had to stop a few times with complaints of shortness of breathe.  Derl Barrow, RN

## 2015-01-09 DIAGNOSIS — K4090 Unilateral inguinal hernia, without obstruction or gangrene, not specified as recurrent: Secondary | ICD-10-CM

## 2015-01-09 HISTORY — DX: Unilateral inguinal hernia, without obstruction or gangrene, not specified as recurrent: K40.90

## 2015-01-20 ENCOUNTER — Other Ambulatory Visit: Payer: Self-pay | Admitting: General Surgery

## 2015-01-20 ENCOUNTER — Ambulatory Visit: Payer: Self-pay | Admitting: General Surgery

## 2015-01-20 NOTE — H&P (Signed)
History of Present Illness Ralene Ok MD; 12/17/2014 11:25 AM) Patient words: hernia.  The patient is a 44 year old female who presents with an inguinal hernia. Patient is a 44 year old female who is referred by Dr. Dianah Field for evaluation of a left inguinal hernia. The patient states this is been here for approximately a month. She states becoming more painful and bothersome. She states that she notices initially after lifting some furniture. She does state she is able to reduce it manually. She has not had any signs or symptoms of incarceration or strangulation. Of note the patient does have a history of COPD. She also has a history of diabetes. She also smokes approximately a pack a day. Patient sees Dr. Buddy Duty for her diabetic medication and Dr. Darene Lamer for COPD.   Allergies Elbert Ewings, CMA; 12/17/2014 10:53 AM) Metoprolol Succinate *BETA BLOCKERS* Shortness of breath. Montelukast Sodium *CHEMICALS* Shortness of breath. Morphine Sulfate *ANALGESICS - OPIOID* Penicillin V Potassium *PENICILLINS*  Medication History Elbert Ewings, CMA; 12/17/2014 10:59 AM) Albuterol Sulfate HFA (108 (90 Base)MCG/ACT Aerosol Soln, Inhalation) Active. Aspirin (81MG  Tablet, Oral) Active. Atorvastatin Calcium (80MG  Tablet, Oral) Active. Invokana (300MG  Tablet, Oral) Active. Welchol (625MG  Tablet, Oral) Active. Flexeril (10MG  Tablet, Oral) Active. Pepcid (20MG  Tablet, Oral as needed) Active. PROzac (40MG  Capsule, Oral) Active. Advair Diskus (500-50MCG/DOSE Aero Pow Br Act, Inhalation) Active. Lasix (40MG  Tablet, Oral) Active. Ibuprofen (200MG  Capsule, Oral as needed) Active. HumuLIN R U-500 (CONCENTRATED) (500UNIT/ML Solution, Subcutaneous) Active. LaMICtal ODT (200MG  Tablet Disperse, Oral) Active. Victoza (18MG /3ML Soln Pen-inj, Subcutaneous) Active. Lisinopril (10MG  Tablet, Oral daily) Active. Claritin (10MG  Tablet, Oral) Active. GlycoLax (Oral) Active. Lyrica (150MG  Capsule,  Oral) Active. SEROquel (400MG  Tablet, Oral) Active. Spiriva Respimat (2.5MCG/ACT Aerosol Soln, Inhalation) Active. TraMADol HCl (50MG  Tablet, Oral) Active. Medications Reconciled  Vitals Elbert Ewings CMA; 12/17/2014 11:00 AM) 12/17/2014 11:00 AM Weight: 247 lb Height: 64in Body Surface Area: 2.25 m Body Mass Index: 42.4 kg/m Temp.: 98.64F(Oral)  Pulse: 87 (Regular)  Resp.: 18 (Unlabored)  BP: 138/78 (Sitting, Left Arm, Standard)    Physical Exam Ralene Ok MD; 12/17/2014 11:24 AM) General Mental Status-Alert. General Appearance-Consistent with stated age. Hydration-Well hydrated. Voice-Normal.  Head and Neck Head-normocephalic, atraumatic with no lesions or palpable masses. Trachea-midline. Thyroid Gland Characteristics - normal size and consistency.  Chest and Lung Exam Chest and lung exam reveals -quiet, even and easy respiratory effort with no use of accessory muscles and on auscultation, normal breath sounds, no adventitious sounds and normal vocal resonance. Inspection Chest Wall - Normal. Back - normal.  Cardiovascular Cardiovascular examination reveals -normal heart sounds, regular rate and rhythm with no murmurs and normal pedal pulses bilaterally.  Abdomen Inspection Skin - Scar - no surgical scars. Hernias - Inguinal hernia - Left - Reducible. Palpation/Percussion Normal exam - Soft, Non Tender, No Rebound tenderness, No Rigidity (guarding) and No hepatosplenomegaly. Auscultation Normal exam - Bowel sounds normal.    Assessment & Plan Ralene Ok MD; 12/17/2014 11:27 AM) LEFT INGUINAL HERNIA (550.90  K40.90) Impression: 44 year old female with a left inguinal hernia.  1. We will have a pulmonary evaluation sent to Dr. Dianah Field 2. Once we have received his Will proceed with open left inguinal hernia repair with mesh. I discussed with patient that this could potentially be done from a local/Mac perspective. His  generalize anesthesia is required there is a possibility the patient can require prolonged intubation and ICU management. 3. All risks and benefits were discussed with the patient to generally include, but not limited to: infection, bleeding,  damage to surrounding structures, acute and chronic nerve pain, and recurrence. Alternatives were offered and described. All questions were answered and the patient voiced understanding of the procedure and wishes to proceed at this point with hernia repair.

## 2015-01-27 ENCOUNTER — Other Ambulatory Visit: Payer: Self-pay | Admitting: *Deleted

## 2015-01-27 MED ORDER — ATORVASTATIN CALCIUM 80 MG PO TABS: 80.0000 mg | ORAL_TABLET | Freq: Every evening | ORAL | Status: DC

## 2015-01-27 NOTE — Telephone Encounter (Signed)
Please have patient schedule appointment for fasting labs/ annual PE. Lipitor sent in x2 months.

## 2015-01-28 NOTE — Telephone Encounter (Signed)
Appointment for annual exam 02/04/2015 at 10 AM.  Derl Barrow, RN

## 2015-02-04 ENCOUNTER — Encounter: Payer: Self-pay | Admitting: Family Medicine

## 2015-02-04 ENCOUNTER — Ambulatory Visit (INDEPENDENT_AMBULATORY_CARE_PROVIDER_SITE_OTHER): Payer: BLUE CROSS/BLUE SHIELD | Admitting: Family Medicine

## 2015-02-04 ENCOUNTER — Other Ambulatory Visit: Payer: Self-pay | Admitting: Family Medicine

## 2015-02-04 VITALS — BP 127/69 | HR 93 | Temp 98.5°F | Ht 66.0 in | Wt 239.7 lb

## 2015-02-04 DIAGNOSIS — E1165 Type 2 diabetes mellitus with hyperglycemia: Secondary | ICD-10-CM | POA: Diagnosis not present

## 2015-02-04 DIAGNOSIS — K4090 Unilateral inguinal hernia, without obstruction or gangrene, not specified as recurrent: Secondary | ICD-10-CM

## 2015-02-04 DIAGNOSIS — I10 Essential (primary) hypertension: Secondary | ICD-10-CM | POA: Diagnosis not present

## 2015-02-04 DIAGNOSIS — Z0000 Encounter for general adult medical examination without abnormal findings: Secondary | ICD-10-CM

## 2015-02-04 DIAGNOSIS — E1142 Type 2 diabetes mellitus with diabetic polyneuropathy: Secondary | ICD-10-CM

## 2015-02-04 DIAGNOSIS — E781 Pure hyperglyceridemia: Secondary | ICD-10-CM

## 2015-02-04 DIAGNOSIS — IMO0002 Reserved for concepts with insufficient information to code with codable children: Secondary | ICD-10-CM

## 2015-02-04 LAB — LIPID PANEL
Cholesterol: 166 mg/dL (ref 125–200)
HDL: 37 mg/dL — ABNORMAL LOW (ref >=46)
LDL Cholesterol: 59 mg/dL (ref <130)
Total CHOL/HDL Ratio: 4.5 Ratio (ref <=5.0)
Triglycerides: 352 mg/dL — ABNORMAL HIGH (ref <150)
VLDL: 70 mg/dL — ABNORMAL HIGH (ref <30)

## 2015-02-04 LAB — COMPREHENSIVE METABOLIC PANEL
ALT: 64 U/L — ABNORMAL HIGH (ref 6–29)
AST: 48 U/L — ABNORMAL HIGH (ref 10–35)
Albumin: 4.2 g/dL (ref 3.6–5.1)
Alkaline Phosphatase: 109 U/L (ref 33–115)
BUN: 11 mg/dL (ref 7–25)
CO2: 25 mEq/L (ref 20–31)
Calcium: 9.3 mg/dL (ref 8.6–10.2)
Chloride: 94 mEq/L — ABNORMAL LOW (ref 98–110)
Creat: 0.81 mg/dL (ref 0.50–1.10)
Glucose, Bld: 425 mg/dL — ABNORMAL HIGH (ref 65–99)
Potassium: 4.5 mEq/L (ref 3.5–5.3)
Sodium: 130 mEq/L — ABNORMAL LOW (ref 135–146)
Total Bilirubin: 0.6 mg/dL (ref 0.2–1.2)
Total Protein: 7.6 g/dL (ref 6.1–8.1)

## 2015-02-04 LAB — POCT GLYCOSYLATED HEMOGLOBIN (HGB A1C): Hemoglobin A1C: 11.6

## 2015-02-04 MED ORDER — HYDROCODONE-ACETAMINOPHEN 5-325 MG PO TABS: 1.0000 | ORAL_TABLET | Freq: Three times a day (TID) | ORAL | Status: DC

## 2015-02-04 MED ORDER — ALBUTEROL SULFATE HFA 108 (90 BASE) MCG/ACT IN AERS: 2.0000 | INHALATION_SPRAY | RESPIRATORY_TRACT | Status: DC | PRN

## 2015-02-04 MED ORDER — ATORVASTATIN CALCIUM 80 MG PO TABS: 80.0000 mg | ORAL_TABLET | Freq: Every evening | ORAL | Status: DC

## 2015-02-04 NOTE — Progress Notes (Signed)
Patient ID: [name], female   DOB: 1971/03/04, 44 y.o.   MRN: [id number]   Ms Deshmukh presents to office today for her annual physical examination.  Concerns today include:  1. Hernia surgery Patient seeing Dr at [facility].  Needs office notes sent over regarding PFTs and Ambulatory O2.  2. LE Pain Patient compliant with Lyrica and Tramadol.  Pain is moderately controlled on these medications.  She has been out of tramadol >1.5 weeks.  Upon further discussion patient notes that she has a h/o seizure d/o.  Last seizure >1 year ago.  Denies sedation, constipation.  Last eye exam: 01/2014 Last dental exam: 08/2014 Last mammogram: never Last pap smear: 02/04/2013 Immunizations needed: UTD Refills needed today: Lipitor, Albuterol, Tramadol  Past Medical History  Diagnosis Date  . Asthma   . COPD (chronic obstructive pulmonary disease)   . Diabetes mellitus   . Renal disorder   . Hypertension   . Seizures   . Syncope   . Bipolar 1 disorder   . DVT (deep venous thrombosis)     2011- was on coumadin for a year  . Stroke     2010  . GERD (gastroesophageal reflux disease)   . Hyperlipidemia    History   Social History  . Marital Status: Married    Spouse Name: N/A  . Number of Children: N/A  . Years of Education: N/A   Occupational History  . Not on file.   Social History Main Topics  . Smoking status: Current Every Day Smoker -- 1.00 packs/day for 20 years    Types: Cigarettes  . Smokeless tobacco: Never Used  . Alcohol Use: No  . Drug Use: No  . Sexual Activity: Yes   Other Topics Concern  . Not on file   Social History Narrative   Lives in [place]   Married but lives with Boyfriend - not legally separated   Disabled - for BiPolar, Seizure disorder, diabetes   Formerly worked at [employer]         Past Surgical History  Procedure Laterality Date  . Laparoscopy      Endometriosis  . Tee without cardioversion N/A 11/28/2013    Procedure:  TRANSESOPHAGEAL ECHOCARDIOGRAM (TEE);  Surgeon: Thayer Headings, MD;  Location: [facility];  Service: Cardiovascular;  Laterality: N/A;  . Left heart catheterization with coronary angiogram N/A 12/05/2013    Procedure: LEFT HEART CATHETERIZATION WITH CORONARY ANGIOGRAM;  Surgeon: Jettie Booze, MD;  Location: [facility] CATH LAB;  Service: Cardiovascular;  Laterality: N/A;   Family History  Problem Relation Age of Onset  . Venous thrombosis Brother   . Other Brother     BRAIN TUMOR  . Asthma Father   . Diabetes Father   . Coronary artery disease Mother   . Hypertension Mother   . Diabetes Mother   . Asthma Sister   . Diabetes type II Brother     ROS: Review of Systems Constitutional: negative Eyes: negative Ears, nose, mouth, throat, and face: negative Respiratory: positive for dyspnea on exertion and COPD Cardiovascular: negative Gastrointestinal: negative Genitourinary:negative Integument/breast: negative Hematologic/lymphatic: negative Musculoskeletal:positive for LE pain Neurological: positive for paresthesia and seizures Behavioral/Psych: positive for sleep disturbance Endocrine: positive for diabetic symptoms including increased fatigue Allergic/Immunologic: negative   Physical exam BP 127/69 mmHg  Pulse 93  Temp(Src) 98.5 F (36.9 C) (Oral)  Ht 5\' 6"  (1.676 m)  Wt 239 lb 11.2 oz (108.727 kg)  BMI 38.71 kg/m2  LMP 11/11/2011 General appearance: alert,  cooperative, appears stated age, no distress and moderately obese Head: Normocephalic, without obvious abnormality, atraumatic Eyes: conjunctivae/corneas clear. PERRL, EOM's intact. Fundi benign. Ears: normal TM's and external ear canals both ears Nose: Nares normal. Septum midline. Mucosa normal. No drainage or sinus tenderness. Throat: lips, mucosa, and tongue normal; teeth and gums normal Neck: no adenopathy, no carotid bruit, no JVD, supple, symmetrical, trachea midline and thyroid not enlarged, symmetric, no  tenderness/mass/nodules Back: symmetric, no curvature. ROM normal. No CVA tenderness. Lungs: diminished breath sounds globally, no wheeze, no increased WOB Heart: regular rate and rhythm, S1, S2 normal, no murmur, click, rub or gallop Abdomen: soft, non-tender; bowel sounds normal; no masses,  no organomegaly Extremities: extremities normal, atraumatic, no cyanosis or edema L inguinal hernia Pulses:+1 pedal pulses b/l Skin: Skin color, texture, turgor normal. No rashes or lesions Lymph nodes: Cervical, supraclavicular, and axillary nodes normal. Neurologic: Alert and oriented X 3, normal strength and tone. Normal symmetric reflexes. Normal coordination and gait Reflexes: quadriceps reflex (L-2 to L-4) right and left 1/4   A1c 11.6  Assessment: Patient with multiple comorbidities here for annual physical exam.   Plan: Please see problem list for individual plan.  Raiven Belizaire M. Lajuana Ripple, DO PGY-2, [facility]   Patient is due for a screening mammogram.  Information on imaging center given to patient to call and schedule mammogram appointment.

## 2015-02-04 NOTE — Assessment & Plan Note (Signed)
Will send PFTs and Ambulatory O2 notes to [facility].

## 2015-02-04 NOTE — Patient Instructions (Signed)
It was a pleasure seeing you today, [name].  Information regarding what we discussed is included in this packet.   A referral to pain clinic has been placed for you.  Please make an appointment to see me in 3 months for chronic disease management. I will contact you will the results of your labs.  If anything is abnormal, I will call you.  Otherwise, expect a copy to be mailed to you.  Also, schedule your mammogram.  Please feel free to call our office at [phone] if any questions or concerns arise.  Warm Regards, Winna Golla M. Cayley Pester, DO Breast Self-Awareness Breast self-awareness allows you to notice a breast problem early while it is still small. Do a breast self-exam:  Every month, 5-7 days after your period (menstrual period).  At the same time each month if you do not have periods anymore. Look for any:  Difference between your breasts (size, shape, or position).  Change in breast shape or size.  Fluid or blood coming from your nipples.  Changes in your nipples (dimpling, nipple movement).   Change in skin color or texture (redness, scaly areas). Feel for:  Lumps.  Bumps.  Dips.  Any other changes. HOW TO DO A BREAST SELF-EXAM Look at your breasts and nipples. 1. Take off all your clothes above your waist. 2. Stand in front of a mirror in a room with good lighting. 3. Put your hands on your hips and push your hands downward. Feel your breasts.  1. Lie flat on your back or stand in the shower or tub. If you are in the shower or tub, have wet, soapy hands. 2. Place your right arm above your head. 3. Place your left hand in the right underarm area. 4. Make small circles using the pads (not the fingertips) of your 3 middle fingers. Press lightly and then with medium and firm pressure. 5. Move your fingers a little lower and make the small circles at the 3 pressures (light, medium, and firm). 6. Continue moving your fingers lower and making circles until you reach  the bottom of your breast. 7. Move your fingers one finger-width towards the center of the body. 8. Continue making the circles, this time moving upward until you reach the bottom of your neck. 9. Move your fingers one finger-width towards the center of your body. 10. Make circles downward when starting at the bottom of the neck. Make circles upward when starting at the bottom of the breast. Stop when you reach the middle of the chest. 11.  Repeat these steps on the other breast. Write down what looks and feels normal for each breast. Also write down any changes you notice. GET HELP RIGHT AWAY IF:  You see any changes in your breasts or nipples.  You see skin changes.  You have unusual discharge from your nipples.  You feel a new lump.  You feel unusually thick areas. Document Released: 12/14/2007 Document Revised: 06/13/2012 Document Reviewed: 10/12/2011 [facility] Patient Information 2015 [place], [place]. This information is not intended to replace advice given to you by your health care provider. Make sure you discuss any questions you have with your health care provider.    Diabetes and Exercise Exercising regularly is important. It is not just about losing weight. It has many health benefits, such as:  Improving your overall fitness, flexibility, and endurance.  Increasing your bone density.  Helping with weight control.  Decreasing your body fat.  Increasing your muscle strength.  Reducing  stress and tension.  Improving your overall health. People with diabetes who exercise gain additional benefits because exercise:  Reduces appetite.  Improves the body's use of blood sugar (glucose).  Helps lower or control blood glucose.  Decreases blood pressure.  Helps control blood lipids (such as cholesterol and triglycerides).  Improves the body's use of the hormone insulin by:  Increasing the body's insulin sensitivity.  Reducing the body's insulin  needs.  Decreases the risk for heart disease because exercising:  Lowers cholesterol and triglycerides levels.  Increases the levels of good cholesterol (such as high-density lipoproteins [HDL]) in the body.  Lowers blood glucose levels. YOUR ACTIVITY PLAN  Choose an activity that you enjoy and set realistic goals. Your health care provider or diabetes educator can help you make an activity plan that works for you. Exercise regularly as directed by your health care provider. This includes:  Performing resistance training twice a week such as push-ups, sit-ups, lifting weights, or using resistance bands.  Performing 150 minutes of cardio exercises each week such as walking, running, or playing sports.  Staying active and spending no more than 90 minutes at one time being inactive. Even short bursts of exercise are good for you. Three 10-minute sessions spread throughout the day are just as beneficial as a single 30-minute session. Some exercise ideas include: 4. Taking the dog for a walk. 5. Taking the stairs instead of the elevator. 6. Dancing to your favorite song. 7. Doing an exercise video. 8. Doing your favorite exercise with a friend. RECOMMENDATIONS FOR EXERCISING WITH TYPE 1 OR TYPE 2 DIABETES  12. Check your blood glucose before exercising. If blood glucose levels are greater than 240 mg/dL, check for urine ketones. Do not exercise if ketones are present. 13. Avoid injecting insulin into areas of the body that are going to be exercised. For example, avoid injecting insulin into: 1. The arms when playing tennis. 2. The legs when jogging. 14. Keep a record of: 1. Food intake before and after you exercise. 2. Expected peak times of insulin action. 3. Blood glucose levels before and after you exercise. 4. The type and amount of exercise you have done. 15. Review your records with your health care provider. Your health care provider will help you to develop guidelines for  adjusting food intake and insulin amounts before and after exercising. 16. If you take insulin or oral hypoglycemic agents, watch for signs and symptoms of hypoglycemia. They include: 1. Dizziness. 2. Shaking. 3. Sweating. 4. Chills. 5. Confusion. 17. Drink plenty of water while you exercise to prevent dehydration or heat stroke. Body water is lost during exercise and must be replaced. 104. Talk to your health care provider before starting an exercise program to make sure it is safe for you. Remember, almost any type of activity is better than none. Document Released: 09/17/2003 Document Revised: 11/11/2013 Document Reviewed: 12/04/2012 [facility] Patient Information 2015 [place], [place]. This information is not intended to replace advice given to you by your health care provider. Make sure you discuss any questions you have with your health care provider.

## 2015-02-04 NOTE — Assessment & Plan Note (Addendum)
A1c 11.6 today.  Chronic LE pain, predominately in the feet.  Patient previously on Tramadol, but in the setting of h/o seizure d/o we have discussed medication and increased risk of lowering seizure threshold.  For this reason, this medication will be discontinued.  Patient does not feel that she can go without pain medication. -Norco 5mg  #30.   -Continue Lyrica -Follow up scheduled with endocrinologist next month. -Referral to pain management clinic -Patient reports tolerance to Dilaudid but not Morphine.  Return precautions have been reviewed -Follow up in 3 months or sooner if needed

## 2015-02-04 NOTE — Assessment & Plan Note (Signed)
Mammogram information provided at today's appointment

## 2015-02-05 ENCOUNTER — Telehealth: Payer: Self-pay | Admitting: Family Medicine

## 2015-02-05 ENCOUNTER — Encounter: Payer: Self-pay | Admitting: Family Medicine

## 2015-02-05 ENCOUNTER — Telehealth: Payer: Self-pay | Admitting: *Deleted

## 2015-02-05 NOTE — Telephone Encounter (Signed)
Received call from [facility] this morning with an alert high on [name] glucose. Dr Lajuana Ripple was notified at 9:00 this morning.Busick, Kevin Fenton

## 2015-02-05 NOTE — Telephone Encounter (Signed)
We discussed labs.  LFTs elevated compared to 2 years ago.  Will plan to repeat these in 3 months, after hernia surgery and once glucose has returned to patient's baseline.  Patient reports that glucose has been elevated into the 400-500 range since hernia issues started.  I recommended that she report this to Dr Buddy Duty at next [facility] appointment.  Currently, patient asx.  Precautions to seek medical care at ED or our office were reviewed.  Patient voices good understanding.  Kaylenn Civil M. Lajuana Ripple, DO PGY-2, [place]

## 2015-02-16 ENCOUNTER — Other Ambulatory Visit: Payer: Self-pay

## 2015-02-16 DIAGNOSIS — M7989 Other specified soft tissue disorders: Secondary | ICD-10-CM

## 2015-02-16 MED ORDER — FUROSEMIDE 40 MG PO TABS: 40.0000 mg | ORAL_TABLET | Freq: Every day | ORAL | Status: DC

## 2015-02-19 ENCOUNTER — Telehealth: Payer: Self-pay | Admitting: Family Medicine

## 2015-02-19 NOTE — Telephone Encounter (Signed)
Pt called and said that [facility] needs a copy or the results of the patients last pulmonary test before they can schedule her surgery. Please fax this to [place] at [phone]. jw

## 2015-02-20 DIAGNOSIS — Z79891 Long term (current) use of opiate analgesic: Secondary | ICD-10-CM | POA: Diagnosis not present

## 2015-02-20 DIAGNOSIS — G894 Chronic pain syndrome: Secondary | ICD-10-CM | POA: Diagnosis not present

## 2015-02-20 NOTE — Telephone Encounter (Signed)
Please send over these tests.  They should include any PFTs with Dr Valentina Lucks and the last ambulatory O2 saturations taken during nursing visit with Tamika.  Send to [place] @ fax# [phone]

## 2015-02-23 NOTE — Telephone Encounter (Signed)
Faxed PFT's and 02 saturations to [place] at number below. Ottis Stain, CMA

## 2015-02-27 ENCOUNTER — Other Ambulatory Visit: Payer: Self-pay | Admitting: *Deleted

## 2015-02-27 MED ORDER — LISINOPRIL 10 MG PO TABS: 10.0000 mg | ORAL_TABLET | Freq: Every day | ORAL | Status: DC

## 2015-02-27 NOTE — Telephone Encounter (Signed)
Refill sent in.  Algis Greenhouse. Jerline Pain, [facility] Resident PGY-2 02/27/2015 4:06 PM

## 2015-03-05 ENCOUNTER — Telehealth: Payer: Self-pay | Admitting: Family Medicine

## 2015-03-05 NOTE — Telephone Encounter (Signed)
Pt calling and states that [facility] needs the "Pulmonary Report for surgical clearance". Please fax to [place] at [place] at fax number: [phone]. Sadie Reynolds, ASA

## 2015-03-05 NOTE — Telephone Encounter (Signed)
Printed and faxed results to [place] at the number below. Ottis Stain, CMA

## 2015-03-06 ENCOUNTER — Encounter (HOSPITAL_COMMUNITY): Payer: Self-pay

## 2015-03-06 ENCOUNTER — Emergency Department (INDEPENDENT_AMBULATORY_CARE_PROVIDER_SITE_OTHER)
Admission: EM | Admit: 2015-03-06 | Discharge: 2015-03-06 | Disposition: A | Discharge status: Home / Self Care | Payer: BLUE CROSS/BLUE SHIELD | Source: Home / Self Care | Attending: Family Medicine | Admitting: Family Medicine

## 2015-03-06 ENCOUNTER — Emergency Department (INDEPENDENT_AMBULATORY_CARE_PROVIDER_SITE_OTHER): Payer: BLUE CROSS/BLUE SHIELD

## 2015-03-06 DIAGNOSIS — S93402A Sprain of unspecified ligament of left ankle, initial encounter: Secondary | ICD-10-CM

## 2015-03-06 DIAGNOSIS — S8252XA Displaced fracture of medial malleolus of left tibia, initial encounter for closed fracture: Secondary | ICD-10-CM | POA: Diagnosis not present

## 2015-03-06 NOTE — ED Notes (Signed)
States she injured her left foot 1 week ago when she missed a step, and has continued pain and crepitus w ambulation

## 2015-03-06 NOTE — ED Provider Notes (Signed)
CSN: [id number]     Arrival date & time 03/06/15  1600 History   First MD Initiated Contact with Patient 03/06/15 1709     Chief Complaint  Patient presents with  . Foot Pain   (Consider location/radiation/quality/duration/timing/severity/associated sxs/prior Treatment) HPI Comments: 44 year old female states that while she was descending stairs 1 week ago that she twisted her left ankle and foot. She experienced pain primarily to the ankle. She is continued to ambulate and bear full weight over the past week and is now concerned about persistent pain and swelling, primarily to the ankle.  Patient is a 44 y.o. female presenting with lower extremity pain.  Foot Pain This is a new problem. The current episode started more than 1 week ago. The problem occurs constantly. The problem has been gradually worsening. Pertinent negatives include no abdominal pain, no headaches and no shortness of breath. The symptoms are aggravated by walking and standing. The symptoms are relieved by rest. She has tried nothing for the symptoms.    Past Medical History  Diagnosis Date  . Asthma   . COPD (chronic obstructive pulmonary disease)   . Diabetes mellitus   . Renal disorder   . Hypertension   . Seizures   . Syncope   . Bipolar 1 disorder   . DVT (deep venous thrombosis)     2011- was on coumadin for a year  . Stroke     2010  . GERD (gastroesophageal reflux disease)   . Hyperlipidemia   . Inguinal hernia, left 01/2015   Past Surgical History  Procedure Laterality Date  . Laparoscopy      Endometriosis  . Tee without cardioversion N/A 11/28/2013    Procedure: TRANSESOPHAGEAL ECHOCARDIOGRAM (TEE);  Surgeon: Thayer Headings, MD;  Location: [place];  Service: Cardiovascular;  Laterality: N/A;  . Left heart catheterization with coronary angiogram N/A 12/05/2013    Procedure: LEFT HEART CATHETERIZATION WITH CORONARY ANGIOGRAM;  Surgeon: Jettie Booze, MD;  Location: [facility] CATH LAB;  Service:  Cardiovascular;  Laterality: N/A;   Family History  Problem Relation Age of Onset  . Venous thrombosis Brother   . Other Brother     BRAIN TUMOR  . Asthma Father   . Diabetes Father   . Coronary artery disease Mother   . Hypertension Mother   . Diabetes Mother   . Asthma Sister   . Diabetes type II Brother    Social History  Substance Use Topics  . Smoking status: Current Every Day Smoker -- 1.00 packs/day for 20 years    Types: Cigarettes  . Smokeless tobacco: Never Used  . Alcohol Use: No   OB History    No data available     Review of Systems  Constitutional: Negative for fever, chills and activity change.  Respiratory: Negative.  Negative for shortness of breath.   Gastrointestinal: Negative for abdominal pain.  Musculoskeletal: Positive for joint swelling. Negative for back pain and neck pain.       As per HPI  Skin: Negative for color change, pallor and rash.  Neurological: Negative.  Negative for headaches.    Allergies  Metoprolol; Montelukast sodium; Morphine sulfate; and Penicillins  Home Medications   Prior to Admission medications   Medication Sig Start Date End Date Taking? Authorizing Provider  albuterol (PROVENTIL HFA;VENTOLIN HFA) 108 (90 BASE) MCG/ACT inhaler Inhale 2 puffs into the lungs every 4 (four) hours as needed. For shortness of breath 02/04/15   Janora Norlander, DO  aspirin EC  81 MG tablet Take 81 mg by mouth every morning.    Historical Provider, MD  atorvastatin (LIPITOR) 80 MG tablet Take 1 tablet (80 mg total) by mouth every evening. 02/04/15   Janora Norlander, DO  Canagliflozin (INVOKANA) 300 MG TABS Take 300 mg by mouth daily.    Historical Provider, MD  clonazePAM (KLONOPIN) 1 MG tablet Take 1 mg by mouth 2 (two) times daily. 04/16/13   Gerda Diss, MD  colesevelam [facility]) 625 MG tablet Take 1,250 mg by mouth 2 (two) times daily with a meal.    Historical Provider, MD  cyclobenzaprine (FLEXERIL) 10 MG tablet Take 1 tablet (10  mg total) by mouth 3 (three) times daily as needed for muscle spasms. 08/22/14   Coral Spikes, DO  famotidine (PEPCID) 20 MG tablet Take 20 mg by mouth 2 (two) times daily.    Historical Provider, MD  FLUoxetine (PROZAC) 40 MG capsule Take 40 mg by mouth daily.     Historical Provider, MD  Fluticasone-Salmeterol (ADVAIR DISKUS) 500-50 MCG/DOSE AEPB Inhale 1 puff into the lungs daily. 04/15/14   Zenia Resides, MD  furosemide (LASIX) 40 MG tablet Take 1 tablet (40 mg total) by mouth daily. 02/16/15   Jettie Booze, MD  HYDROcodone-acetaminophen (NORCO) 5-325 MG per tablet Take 1 tablet by mouth 3 (three) times daily. 02/04/15   Ashly Windell Moulding, DO  ibuprofen (ADVIL,MOTRIN) 200 MG tablet Take 600 mg by mouth 2 (two) times daily as needed.    Historical Provider, MD  insulin regular human CONCENTRATED (HUMULIN R) 500 UNIT/ML SOLN injection Inject 50 Units into the skin.     Historical Provider, MD  lamoTRIgine (LAMICTAL) 200 MG tablet Take 200 mg by mouth at bedtime.    Historical Provider, MD  Liraglutide (VICTOZA) 18 MG/3ML SOPN Inject 1.8 mg into the skin daily.     Historical Provider, MD  lisinopril (PRINIVIL,ZESTRIL) 10 MG tablet Take 1 tablet (10 mg total) by mouth at bedtime. 02/27/15   Vivi Barrack, MD  loratadine (CLARITIN) 10 MG tablet Take 1 tablet (10 mg total) by mouth daily. 01/21/14   Ashly Windell Moulding, DO  metFORMIN (GLUCOPHAGE) 500 MG tablet Take 500 mg by mouth 2 (two) times daily with a meal.    Delrae Rend, MD  polyethylene glycol powder (GLYCOLAX/MIRALAX) powder Take 1 Container by mouth once.    Historical Provider, MD  pregabalin (LYRICA) 150 MG capsule Take 1 capsule (150 mg total) by mouth 2 (two) times daily. 10/21/14   Ashly Windell Moulding, DO  QUEtiapine (SEROQUEL XR) 400 MG 24 hr tablet Take 800 mg by mouth at bedtime.    Historical Provider, MD  Tiotropium Bromide Monohydrate (SPIRIVA RESPIMAT) 2.5 MCG/ACT AERS Inhale 2 puffs into the lungs daily. 04/01/14   Zenia Resides, MD   Meds Ordered and Administered this Visit  Medications - No data to display  BP 145/77 mmHg  Pulse 113  Temp(Src) 98.6 F (37 C) (Oral)  Resp 16  SpO2 99%  LMP 11/11/2011 No data found.   Physical Exam  Constitutional: She is oriented to person, place, and time. She appears well-developed and well-nourished. No distress.  HENT:  Head: Normocephalic and atraumatic.  Eyes: EOM are normal.  Neck: Normal range of motion. Neck supple.  Pulmonary/Chest: Effort normal. No respiratory distress.  Musculoskeletal: Normal range of motion.  Left ankle swelling and tenderness to the medial and lateral malleolar. Swelling extends into the foot. Little to no tenderness  to the foot. There is tenderness to the plantar aspect of the distal heel. No deformity. Minor ecchymosis. Pedal pulses 2+. Distal neurovascular motor Sentry is intact. Plantar flexion and dorsiflexion intact although limited in the range of motion.  Neurological: She is alert and oriented to person, place, and time. No cranial nerve deficit. She exhibits normal muscle tone.  Skin: Skin is warm and dry.  Nursing note and vitals reviewed.   ED Course  Procedures (including critical care time)  Labs Review Labs Reviewed - No data to display  Imaging Review Dg Ankle Complete Left  03/06/2015   CLINICAL DATA:  Per pt: coming down the steps a week ago and the left ankle popped. Patient pointed to the left ankle, medial, distal malleolus, anterior, plantar, os-calcis. Patient stated that she fractured the left ankle almost a year ago. Patient is a diabetic. Patient also has neuropathy in both feet and both legs.  EXAM: LEFT ANKLE COMPLETE - 3+ VIEW  COMPARISON:  None.  FINDINGS: Small avulsion fractures noted from the inferior margin of the medial malleolus.  No other fracture.  Ankle mortise is normally spaced and aligned.  There are plantar and dorsal calcaneal spurs.  Mild diffuse soft tissue edema is noted.  IMPRESSION:  Small avulsion fracture from the inferior margin of the medial malleolus. No other fractures. Normally aligned ankle joint.   Electronically Signed   By: Lajean Manes M.D.   On: 03/06/2015 18:07     Visual Acuity Review  Right Eye Distance:   Left Eye Distance:   Bilateral Distance:    Right Eye Near:   Left Eye Near:    Bilateral Near:         MDM   1. Ankle sprain, left, initial encounter   2. Avulsion fracture of medial malleolus, left, closed, initial encounter    Jones wrap and cam walker RICE Recommend no wt bearing See Dr. Sharol Given next week.     Janne Napoleon, NP 03/06/15 1902

## 2015-03-06 NOTE — Discharge Instructions (Signed)
Ankle Sprain °An ankle sprain is an injury to the strong, fibrous tissues (ligaments) that hold the bones of your ankle joint together.  °CAUSES °An ankle sprain is usually caused by a fall or by twisting your ankle. Ankle sprains most commonly occur when you step on the outer edge of your foot, and your ankle turns inward. People who participate in sports are more prone to these types of injuries.  °SYMPTOMS  °· Pain in your ankle. The pain may be present at rest or only when you are trying to stand or walk. °· Swelling. °· Bruising. Bruising may develop immediately or within 1 to 2 days after your injury. °· Difficulty standing or walking, particularly when turning corners or changing directions. °DIAGNOSIS  °Your caregiver will ask you details about your injury and perform a physical exam of your ankle to determine if you have an ankle sprain. During the physical exam, your caregiver will press on and apply pressure to specific areas of your foot and ankle. Your caregiver will try to move your ankle in certain ways. An X-ray exam may be done to be sure a bone was not broken or a ligament did not separate from one of the bones in your ankle (avulsion fracture).  °TREATMENT  °Certain types of braces can help stabilize your ankle. Your caregiver can make a recommendation for this. Your caregiver may recommend the use of medicine for pain. If your sprain is severe, your caregiver may refer you to a surgeon who helps to restore function to parts of your skeletal system (orthopedist) or a physical therapist. °HOME CARE INSTRUCTIONS  °· Apply ice to your injury for 1-2 days or as directed by your caregiver. Applying ice helps to reduce inflammation and pain. °¨ Put ice in a plastic bag. °¨ Place a towel between your skin and the bag. °¨ Leave the ice on for 15-20 minutes at a time, every 2 hours while you are awake. °· Only take over-the-counter or prescription medicines for pain, discomfort, or fever as directed by  your caregiver. °· Elevate your injured ankle above the level of your heart as much as possible for 2-3 days. °· If your caregiver recommends crutches, use them as instructed. Gradually put weight on the affected ankle. Continue to use crutches or a cane until you can walk without feeling pain in your ankle. °· If you have a plaster splint, wear the splint as directed by your caregiver. Do not rest it on anything harder than a pillow for the first 24 hours. Do not put weight on it. Do not get it wet. You may take it off to take a shower or bath. °· You may have been given an elastic bandage to wear around your ankle to provide support. If the elastic bandage is too tight (you have numbness or tingling in your foot or your foot becomes cold and blue), adjust the bandage to make it comfortable. °· If you have an air splint, you may blow more air into it or let air out to make it more comfortable. You may take your splint off at night and before taking a shower or bath. Wiggle your toes in the splint several times per day to decrease swelling. °SEEK MEDICAL CARE IF:  °· You have rapidly increasing bruising or swelling. °· Your toes feel extremely cold or you lose feeling in your foot. °· Your pain is not relieved with medicine. °SEEK IMMEDIATE MEDICAL CARE IF: °· Your toes are numb or blue. °·   You have severe pain that is increasing. MAKE SURE YOU:   Understand these instructions.  Will watch your condition.  Will get help right away if you are not doing well or get worse. Document Released: 06/27/2005 Document Revised: 03/21/2012 Document Reviewed: 07/09/2011 [facility] Patient Information 2015 [place], [place]. This information is not intended to replace advice given to you by your health care provider. Make sure you discuss any questions you have with your health care provider.  Ankle Fracture A fracture is a break in a bone. A cast or splint may be used to protect the ankle and heal the break. Sometimes,  surgery is needed. HOME CARE  Use crutches as told by your doctor. It is very important that you use your crutches correctly.  Do not put weight or pressure on the injured ankle until told by your doctor.  Keep your ankle raised (elevated) when sitting or lying down.  Apply ice to the ankle:  Put ice in a plastic bag.  Place a towel between your cast and the bag.  Leave the ice on for 20 minutes, 2-3 times a day.  If you have a plaster or fiberglass cast:  Do not try to scratch under the cast with any objects.  Check the skin around the cast every day. You may put lotion on red or sore areas.  Keep your cast dry and clean.  If you have a plaster splint:  Wear the splint as told by your doctor.  You can loosen the elastic around the splint if your toes get numb, tingle, or turn cold or blue.  Do not put pressure on any part of your cast or splint. It may break. Rest your plaster splint or cast only on a pillow the first 24 hours until it is fully hardened.  Cover your cast or splint with a plastic bag during showers.  Do not lower your cast or splint into water.  Take medicine as told by your doctor.  Do not drive until your doctor says it is safe.  Follow-up with your doctor as told. It is very important that you go to your follow-up visits. GET HELP IF: The swelling and discomfort gets worse.  GET HELP RIGHT AWAY IF:   Your splint or cast breaks.  You continue to have very bad pain.  You have new pain or swelling after your splint or cast was put on.  Your skin or toes below the injured ankle:  Turn blue or gray.  Feel cold, numb, or you cannot feel them.  There is a bad smell or yellowish white fluid (pus) coming from under the splint or cast. MAKE SURE YOU:   Understand these instructions.  Will watch your condition.  Will get help right away if you are not doing well or get worse. Document Released: 04/24/2009 Document Revised: 04/17/2013 Document  Reviewed: 01/24/2013 [facility] Patient Information 2015 [place], [place]. This information is not intended to replace advice given to you by your health care provider. Make sure you discuss any questions you have with your health care provider.

## 2015-03-06 NOTE — Progress Notes (Signed)
Orthopedic Tech Progress Note Patient Details:  [name] 02-May-1971 [id number]  Ortho Devices Type of Ortho Device: CAM walker, Doran Durand splint Ortho Device/Splint Location: LLE Ortho Device/Splint Interventions: Ordered, Application   Braulio Bosch 03/06/2015, 7:39 PM

## 2015-03-09 ENCOUNTER — Encounter: Payer: Self-pay | Admitting: Family Medicine

## 2015-03-27 ENCOUNTER — Encounter: Payer: Self-pay | Admitting: Family Medicine

## 2015-03-27 ENCOUNTER — Ambulatory Visit (INDEPENDENT_AMBULATORY_CARE_PROVIDER_SITE_OTHER): Payer: BLUE CROSS/BLUE SHIELD | Admitting: Family Medicine

## 2015-03-27 VITALS — BP 107/55 | HR 105 | Temp 98.2°F | Ht 65.0 in | Wt 255.5 lb

## 2015-03-27 DIAGNOSIS — I878 Other specified disorders of veins: Secondary | ICD-10-CM | POA: Diagnosis not present

## 2015-03-27 NOTE — Progress Notes (Signed)
   HPI  CC: bilateral foot swelling Patient is presenting today for bilateral foot swelling x2wks. Patient states that over this time period she has noticed a significant amount of swelling in her feet. This swelling always improves w/ ambulation, propping feet up, and are betting first thing in the morning. swelling is worse when she is seated for prolonged periods. She reports being much more stationary recently due to an upcoming surgery for a hernia repair. She denies any SOB, fatigue, weakness, DOE, CP, shoulder/jaw pain, reflux sxs, dizziness, n/v/d.  Review of Systems   See HPI for ROS. All other systems reviewed and are negative.  Past medical history and social history reviewed and updated in the EMR as appropriate.  Objective: BP 107/55 mmHg  Pulse 105  Temp(Src) 98.2 F (36.8 C) (Oral)  Ht 5\' 5"  (1.651 m)  Wt 255 lb 8 oz (115.894 kg)  BMI 42.52 kg/m2  LMP 11/11/2011 Gen: NAD, alert, cooperative HEENT: NCAT, EOMI, PERRL, no JVD CV: RRR, no murmur appreciated Resp: CTAB, no wheezes, non-labored Abd: SNTND, BS present, no guarding or organomegaly Ext: Bilateral +1 pitting edema to distal 1/3 of tibia. No TTP over either extremity. Pulses present bilaterally. Good cap refill. FROM, strength 5/5. Sensation extremely limited in feet bilaterally (patient reports this is at baseline) Neuro: Alert and oriented, Speech clear, No gross deficits  Assessment and plan:  Venous stasis of both lower extremities Patient's signs and sxs most c/w venous stasis. Symptom onset was around the time she reports becoming more stationary. The resolution of sxs w/ minimal ambulation or LE elevation is also consistent w/ this Dx. No CP, JVD, DOE, and her mild cardiac history brings some concern for cardiac etiology, however she was just seen by cardiology 3 months ago. Last Echo was ~35mths ago, I do not believe she would benefit from repeating this at this time. The bilateral nature of this issue  makes DVT much less likely to be a factor.  - encouraged ambulation/exercise for 30 minutes BID. (this would likely cause benefit in 2 ways: increase venous return, and reduce femoral vein mechanical collapse from weight of her pannus.) - LE elevation while stationary. - reduce salt intake. - Discussed red flag sxs that would require reevaluation immediately.    Elberta Leatherwood, MD,MS,  PGY2 03/29/2015 1:07 PM

## 2015-03-27 NOTE — Patient Instructions (Signed)
It was a pleasure seeing you today in our clinic. Today we discussed you foot swelling. Here is the treatment plan we have discussed and agreed upon together:   - I believe that most of this foot swelling is from a reduction in your overall activity level. Try your best to remain active. I would encourage you to take a 1/2-to-1 mile walk 2 times a day. This should help mobilize some of the fluid in your feet and legs. - When you are going to sit down I would recommend you propping your feet up. Try your best to raise your feet above the level of your heart (at LEAST above your hips).  - Watch your salt intake. - Sturdy footwear may also help your comfort during these walks.

## 2015-03-29 DIAGNOSIS — I878 Other specified disorders of veins: Secondary | ICD-10-CM | POA: Insufficient documentation

## 2015-03-29 HISTORY — DX: Other specified disorders of veins: I87.8

## 2015-03-29 NOTE — Assessment & Plan Note (Signed)
Patient's signs and sxs most c/w venous stasis. Symptom onset was around the time she reports becoming more stationary. The resolution of sxs w/ minimal ambulation or LE elevation is also consistent w/ this Dx. No CP, JVD, DOE, and her mild cardiac history brings some concern for cardiac etiology, however she was just seen by cardiology 3 months ago. Last Echo was ~42mths ago, I do not believe she would benefit from repeating this at this time. The bilateral nature of this issue makes DVT much less likely to be a factor.  - encouraged ambulation/exercise for 30 minutes BID. (this would likely cause benefit in 2 ways: increase venous return, and reduce femoral vein mechanical collapse from weight of her pannus.) - LE elevation while stationary. - reduce salt intake. - Discussed red flag sxs that would require reevaluation immediately.

## 2015-04-01 ENCOUNTER — Encounter (HOSPITAL_COMMUNITY): Payer: Self-pay

## 2015-04-01 ENCOUNTER — Encounter (HOSPITAL_COMMUNITY)
Admission: RE | Admit: 2015-04-01 | Discharge: 2015-04-01 | Disposition: A | Discharge status: Home / Self Care | Payer: Medicare Other | Source: Ambulatory Visit | Attending: General Surgery | Admitting: General Surgery

## 2015-04-01 DIAGNOSIS — E785 Hyperlipidemia, unspecified: Secondary | ICD-10-CM | POA: Diagnosis not present

## 2015-04-01 DIAGNOSIS — J45909 Unspecified asthma, uncomplicated: Secondary | ICD-10-CM | POA: Diagnosis not present

## 2015-04-01 DIAGNOSIS — Z01812 Encounter for preprocedural laboratory examination: Secondary | ICD-10-CM | POA: Insufficient documentation

## 2015-04-01 DIAGNOSIS — I10 Essential (primary) hypertension: Secondary | ICD-10-CM | POA: Insufficient documentation

## 2015-04-01 DIAGNOSIS — Z8673 Personal history of transient ischemic attack (TIA), and cerebral infarction without residual deficits: Secondary | ICD-10-CM | POA: Insufficient documentation

## 2015-04-01 DIAGNOSIS — E119 Type 2 diabetes mellitus without complications: Secondary | ICD-10-CM | POA: Diagnosis not present

## 2015-04-01 DIAGNOSIS — Z7982 Long term (current) use of aspirin: Secondary | ICD-10-CM | POA: Insufficient documentation

## 2015-04-01 DIAGNOSIS — K4090 Unilateral inguinal hernia, without obstruction or gangrene, not specified as recurrent: Secondary | ICD-10-CM | POA: Diagnosis not present

## 2015-04-01 DIAGNOSIS — Z01818 Encounter for other preprocedural examination: Secondary | ICD-10-CM | POA: Diagnosis present

## 2015-04-01 DIAGNOSIS — Z794 Long term (current) use of insulin: Secondary | ICD-10-CM | POA: Diagnosis not present

## 2015-04-01 DIAGNOSIS — Z79899 Other long term (current) drug therapy: Secondary | ICD-10-CM | POA: Diagnosis not present

## 2015-04-01 DIAGNOSIS — F17200 Nicotine dependence, unspecified, uncomplicated: Secondary | ICD-10-CM | POA: Insufficient documentation

## 2015-04-01 DIAGNOSIS — J449 Chronic obstructive pulmonary disease, unspecified: Secondary | ICD-10-CM | POA: Insufficient documentation

## 2015-04-01 DIAGNOSIS — K219 Gastro-esophageal reflux disease without esophagitis: Secondary | ICD-10-CM | POA: Diagnosis not present

## 2015-04-01 HISTORY — DX: Family history of other specified conditions: Z84.89

## 2015-04-01 HISTORY — DX: Personal history of other venous thrombosis and embolism: Z86.718

## 2015-04-01 HISTORY — DX: Anxiety disorder, unspecified: F41.9

## 2015-04-01 HISTORY — DX: Other muscle spasm: M62.838

## 2015-04-01 HISTORY — DX: Simple chronic bronchitis: J41.0

## 2015-04-01 HISTORY — DX: Polyneuropathy, unspecified: G62.9

## 2015-04-01 LAB — CBC
HCT: 40.2 % (ref 36.0–46.0)
Hemoglobin: 13.2 g/dL (ref 12.0–15.0)
MCH: 29.6 pg (ref 26.0–34.0)
MCHC: 32.8 g/dL (ref 30.0–36.0)
MCV: 90.1 fL (ref 78.0–100.0)
Platelets: 229 10*3/uL (ref 150–400)
RBC: 4.46 MIL/uL (ref 3.87–5.11)
RDW: 15.6 % — ABNORMAL HIGH (ref 11.5–15.5)
WBC: 11.8 10*3/uL — ABNORMAL HIGH (ref 4.0–10.5)

## 2015-04-01 LAB — BASIC METABOLIC PANEL
Anion gap: 11 (ref 5–15)
BUN: 16 mg/dL (ref 6–20)
CO2: 25 mmol/L (ref 22–32)
Calcium: 9.4 mg/dL (ref 8.9–10.3)
Chloride: 96 mmol/L — ABNORMAL LOW (ref 101–111)
Creatinine, Ser: 0.83 mg/dL (ref 0.44–1.00)
GFR calc Af Amer: >60 mL/min (ref >60)
GFR calc non Af Amer: >60 mL/min (ref >60)
Glucose, Bld: 306 mg/dL — ABNORMAL HIGH (ref 65–99)
Potassium: 4.5 mmol/L (ref 3.5–5.1)
Sodium: 132 mmol/L — ABNORMAL LOW (ref 135–145)

## 2015-04-01 LAB — GLUCOSE, CAPILLARY: Glucose-Capillary: 275 mg/dL — ABNORMAL HIGH (ref 65–99)

## 2015-04-01 MED ORDER — CHLORHEXIDINE GLUCONATE 4 % EX LIQD: 1.0000 "application " | Freq: Once | CUTANEOUS | Status: DC

## 2015-04-01 NOTE — Progress Notes (Signed)
Endocrinologist is Dr.Jeffrey [employer]

## 2015-04-01 NOTE — Progress Notes (Addendum)
Anesthesia Chart Review:  Pt is 44 year old female scheduled for L open L inguinal hernia repair with mesh on 04/08/2015 with Dr. Rosendo Gros.   Cardiologist is Dr. Irish Lack, last office visit 01/02/15. PCP is Dr. Adam Phenix at the [facility] internal medicine clinic. Endocrinologist is Dr. Delrae Rend at [facility] endocrinology.   PMH includes: stroke (1989; L sided weakness), HTN, hyperlipidemia, COPD, asthma, DM, seizures, GERD. Current smoker. BMI 42.   Medications include: albuterol, ASA, lipitor, canaglifozin, welchol, pepcid, advair, lasix, humulin R 500, liraglutide, lisinopril, seroquel, spiriva.   Preoperative labs reviewed.  Glucose 306. HgbA1c was 11.6 on 02/04/15. Pt reports fasting blood sugars are always between 200 and 300. Pt also reports she is not taking her insulin as prescribed by Dr. Buddy Duty because she feels it is too much. Notified Wendy in Dr. Johney Frame office that pt's DM is out of control.   Chest x-ray 12/25/2014 reviewed. No active cardiopulmonary disease.   EKG 01/02/2015: sinus tachycardia (105 bpm). Incomplete RBBB.   Cardiac cath 12/05/2013:  1. Normal left main coronary artery. 2. Mild disease in the proximal left anterior descending artery. Patent branches. 3. Widely patent codominant left circumflex artery and its branches, without significant obstructive disease. 4. Mild to moderate disease in the mid right coronary artery. 5. Normal left ventricular systolic function. LVEDP 9 mmHg. Ejection fraction 60 %.  TEE 11/28/2013:  - Left ventricle: Systolic function was normal. The estimated ejection fraction was in the range of 60% to 65%. - Aortic valve: No evidence of vegetation. There was trivial regurgitation. - Mitral valve: No evidence of vegetation. - Left atrium: No evidence of thrombus in the atrial cavity or appendage. - Atrial septum: No defect or patent foramen ovale was identified. - Tricuspid valve: No evidence of vegetation.  Nuclear stress test  05/29/2013:  -Low risk stress nuclear study . No clear evidence of ischemia. LV Ejection Fraction: 64%. LV Wall Motion: NL LV Function; NL Wall Motion.  Carotid duplex US 04/02/2013: - The vertebral arteries appear patent with antegrade flow. - Findings consistent with 1-39 percent stenosis involving the right internal carotid artery and the left internal carotid artery.  If blood sugar acceptable DOS, I anticipate pt can proceed as scheduled.   Willeen Cass, FNP-BC [facility]/Anesthesiology Phone: [phone] 04/02/2015 1:08 PM

## 2015-04-01 NOTE — Pre-Procedure Instructions (Signed)
Valta Sherk  04/01/2015      [id number] [place], [place] - [address] [place] [address] Phone: [phone] Fax: [phone]  [place], [place] [address] [place] [address] Phone: [phone] Fax: [phone]    Your procedure is scheduled on Wed, Sept 28 @ 1:00 PM  Report to [place] at 11:00 AM  Call this number if you have problems the morning of surgery:  [phone]   Remember:  Do not eat food or drink liquids after midnight.  Take these medicines the morning of surgery with A SIP OF WATER Albuterol<Bring Your Inhaler With You>,Klonopin(Clonazepam),Pepcid(Famotidine),Prozac(Fluoxetine),Advair(Fluticasone),Pain Pill(if needed),Claritin(Loratadine),Lyrica(Pregabalin)              Stop taking your Aspirin a week before surgery. No Goody's,BC's,Aleve,Ibuprofen,Fish Oil,or any Herbal Medications.               How to Manage Your Diabetes Before Surgery   Why is it important to control my blood sugar before and after surgery?   Improving blood sugar levels before and after surgery helps healing and can limit problems.  A way of improving blood sugar control is eating a healthy diet by:  - Eating less sugar and carbohydrates  - Increasing activity/exercise  - Talk with your doctor about reaching your blood sugar goals  High blood sugars (greater than 180 mg/dL) can raise your risk of infections and slow down your recovery so you will need to focus on controlling your diabetes during the weeks before surgery.  Make sure that the doctor who takes care of your diabetes knows about your planned surgery including the date and location.  How do I manage my blood sugars before surgery?   Check your blood sugar at least 4 times a day, 2 days before surgery to make sure that they are not too high or low.   Check your blood sugar the morning of  your surgery when you wake up and every 2               hours until you get to the Short-Stay unit.  If your blood sugar is less than 70 mg/dL, you will need to treat for low blood sugar by:  Treat a low blood sugar (less than 70 mg/dL) with 1/2 cup of clear juice (cranberry or apple), 4 glucose tablets, OR glucose gel.  Recheck blood sugar in 15 minutes after treatment (to make sure it is greater than 70 mg/dL).  If blood sugar is not greater than 70 mg/dL on re-check, call [phone] for further instructions.   Report your blood sugar to the Short-Stay nurse when you get to Short-Stay.  References:  [facility], 2007 "How to Manage your Diabetes Before and After Surgery".  What do I do about my diabetes medications?   Do not take oral diabetes medicines (pills) the morning of surgery.  THE NIGHT BEFORE SURGERY, NO BEDTIME DOSE OF HUMULIN R        Do not take other diabetes injectables the day of surgery including Byetta, Victoza, Bydureon, and Trulicity.    If your CBG is greater than 220 mg/dL, you may take 1/2 of your sliding scale (correction) dose of insulin.   For patients with "Insulin Pumps":  Contact your diabetes doctor for specific instructions before surgery.   Decrease basal insulin rates by 20% at midnight the night before  surgery.  Note that if your surgery is planned to be longer than 2 hours, your insulin pump will be removed and intravenous (IV) insulin will be started and managed by the nurses and anesthesiologist.  You will be able to restart your insulin pump once you are awake and able to manage it.  Make sure to bring insulin pump supplies to the hospital with you in case your site needs to be changed.        Do not wear jewelry, make-up or nail polish.  Do not wear lotions, powders, or perfumes.  You may wear deodorant.  Do not shave 48 hours prior to surgery.    Do not bring valuables to the hospital.  [facility] is not responsible for any belongings or valuables.  Contacts, dentures or bridgework may not be worn into surgery.  Leave your suitcase in the car.  After surgery it may be brought to your room.  For patients admitted to the hospital, discharge time will be determined by your treatment team.  Patients discharged the day of surgery will not be allowed to drive home.    Special instructions:  [place] - Preparing for Surgery  Before surgery, you can play an important role.  Because skin is not sterile, your skin needs to be as free of germs as possible.  You can reduce the number of germs on you skin by washing with CHG (chlorahexidine gluconate) soap before surgery.  CHG is an antiseptic cleaner which kills germs and bonds with the skin to continue killing germs even after washing.  Please DO NOT use if you have an allergy to CHG or antibacterial soaps.  If your skin becomes reddened/irritated stop using the CHG and inform your nurse when you arrive at Short Stay.  Do not shave (including legs and underarms) for at least 48 hours prior to the first CHG shower.  You may shave your face.  Please follow these instructions carefully:   1.  Shower with CHG Soap the night before surgery and the                                morning of Surgery.  2.  If you choose to wash your hair, wash your hair first as usual with your       normal shampoo.  3.  After you shampoo, rinse your hair and body thoroughly to remove the                      Shampoo.  4.  Use CHG as you would any other liquid soap.  You can apply chg directly       to the skin and wash gently with scrungie or a clean washcloth.  5.  Apply the CHG Soap to your body ONLY FROM THE NECK DOWN.        Do not use on open wounds or open sores.  Avoid contact with your eyes,       ears, mouth and genitals (private parts).  Wash genitals (private parts)       with your normal soap.  6.  Wash thoroughly, paying special attention to the  area where your surgery        will be performed.  7.  Thoroughly rinse your body with warm water from the neck down.  8.  DO NOT shower/wash with your normal soap after using and  rinsing off       the CHG Soap.  9.  Pat yourself dry with a clean towel.            10.  Wear clean pajamas.            11.  Place clean sheets on your bed the night of your first shower and do not        sleep with pets.  Day of Surgery  Do not apply any lotions/deoderants the morning of surgery.  Please wear clean clothes to the hospital/surgery center.    Please read over the following fact sheets that you were given. Pain Booklet, Coughing and Deep Breathing and Surgical Site Infection Prevention

## 2015-04-01 NOTE — Progress Notes (Addendum)
Cardiologist is Dr.Varanasi with last visit in epic from 01-02-15  Multiple echo reports in epic with most recent one in 2015  Stress test reports in epic from 2010/2014  Heart cath report in epic from 2015  EKG in epic from 01-02-15  CXR in epic from 12-25-14  Sleep study in epic from 2013/2015  Medical Md is [facility]

## 2015-04-07 MED ORDER — VANCOMYCIN HCL IN DEXTROSE 1-5 GM/200ML-% IV SOLN
1000.0000 mg | INTRAVENOUS | Status: AC
  Administered 2015-04-08: 1000 mg via INTRAVENOUS
  Filled 2015-04-07: qty 200

## 2015-04-08 ENCOUNTER — Ambulatory Visit (HOSPITAL_COMMUNITY): Payer: BLUE CROSS/BLUE SHIELD | Admitting: Emergency Medicine

## 2015-04-08 ENCOUNTER — Ambulatory Visit (HOSPITAL_COMMUNITY)
Admission: RE | Admit: 2015-04-08 | Discharge: 2015-04-08 | Disposition: A | Discharge status: Home / Self Care | Payer: BLUE CROSS/BLUE SHIELD | Source: Ambulatory Visit | Attending: General Surgery | Admitting: General Surgery

## 2015-04-08 ENCOUNTER — Ambulatory Visit (HOSPITAL_COMMUNITY): Payer: BLUE CROSS/BLUE SHIELD | Admitting: Vascular Surgery

## 2015-04-08 ENCOUNTER — Encounter (HOSPITAL_COMMUNITY): Payer: Self-pay | Admitting: General Practice

## 2015-04-08 ENCOUNTER — Encounter (HOSPITAL_COMMUNITY)
Admission: RE | Disposition: A | Discharge status: Home / Self Care | Payer: Self-pay | Source: Ambulatory Visit | Attending: General Surgery

## 2015-04-08 DIAGNOSIS — J449 Chronic obstructive pulmonary disease, unspecified: Secondary | ICD-10-CM | POA: Diagnosis not present

## 2015-04-08 DIAGNOSIS — G8918 Other acute postprocedural pain: Secondary | ICD-10-CM | POA: Diagnosis not present

## 2015-04-08 DIAGNOSIS — Z7982 Long term (current) use of aspirin: Secondary | ICD-10-CM | POA: Diagnosis not present

## 2015-04-08 DIAGNOSIS — I6990 Unspecified sequelae of unspecified cerebrovascular disease: Secondary | ICD-10-CM | POA: Diagnosis not present

## 2015-04-08 DIAGNOSIS — J45909 Unspecified asthma, uncomplicated: Secondary | ICD-10-CM | POA: Insufficient documentation

## 2015-04-08 DIAGNOSIS — Z79899 Other long term (current) drug therapy: Secondary | ICD-10-CM | POA: Insufficient documentation

## 2015-04-08 DIAGNOSIS — Z9981 Dependence on supplemental oxygen: Secondary | ICD-10-CM | POA: Diagnosis not present

## 2015-04-08 DIAGNOSIS — R569 Unspecified convulsions: Secondary | ICD-10-CM | POA: Insufficient documentation

## 2015-04-08 DIAGNOSIS — Z794 Long term (current) use of insulin: Secondary | ICD-10-CM | POA: Insufficient documentation

## 2015-04-08 DIAGNOSIS — F17210 Nicotine dependence, cigarettes, uncomplicated: Secondary | ICD-10-CM | POA: Insufficient documentation

## 2015-04-08 DIAGNOSIS — K4090 Unilateral inguinal hernia, without obstruction or gangrene, not specified as recurrent: Secondary | ICD-10-CM | POA: Diagnosis present

## 2015-04-08 DIAGNOSIS — E1151 Type 2 diabetes mellitus with diabetic peripheral angiopathy without gangrene: Secondary | ICD-10-CM | POA: Insufficient documentation

## 2015-04-08 DIAGNOSIS — Z791 Long term (current) use of non-steroidal anti-inflammatories (NSAID): Secondary | ICD-10-CM | POA: Diagnosis not present

## 2015-04-08 DIAGNOSIS — F319 Bipolar disorder, unspecified: Secondary | ICD-10-CM | POA: Insufficient documentation

## 2015-04-08 HISTORY — PX: INSERTION OF MESH: SHX5868

## 2015-04-08 HISTORY — PX: INGUINAL HERNIA REPAIR: SHX194

## 2015-04-08 LAB — GLUCOSE, CAPILLARY
Glucose-Capillary: 250 mg/dL — ABNORMAL HIGH (ref 65–99)
Glucose-Capillary: 211 mg/dL — ABNORMAL HIGH (ref 65–99)
Glucose-Capillary: 233 mg/dL — ABNORMAL HIGH (ref 65–99)

## 2015-04-08 SURGERY — REPAIR, HERNIA, INGUINAL, ADULT
Anesthesia: General | Site: Groin | Laterality: Left

## 2015-04-08 MED ORDER — ONDANSETRON HCL 4 MG/2ML IJ SOLN
INTRAMUSCULAR | Status: DC | PRN
  Administered 2015-04-08: 4 mg via INTRAVENOUS

## 2015-04-08 MED ORDER — PROPOFOL 10 MG/ML IV BOLUS
INTRAVENOUS | Status: AC
  Filled 2015-04-08: qty 20

## 2015-04-08 MED ORDER — ROCURONIUM BROMIDE 50 MG/5ML IV SOLN
INTRAVENOUS | Status: AC
  Filled 2015-04-08: qty 1

## 2015-04-08 MED ORDER — PHENYLEPHRINE HCL 10 MG/ML IJ SOLN
INTRAMUSCULAR | Status: DC | PRN
  Administered 2015-04-08: 80 ug via INTRAVENOUS
  Administered 2015-04-08: 40 ug via INTRAVENOUS
  Administered 2015-04-08: 160 ug via INTRAVENOUS
  Administered 2015-04-08: 80 ug via INTRAVENOUS

## 2015-04-08 MED ORDER — FENTANYL CITRATE (PF) 250 MCG/5ML IJ SOLN
INTRAMUSCULAR | Status: AC
  Filled 2015-04-08: qty 5

## 2015-04-08 MED ORDER — ONDANSETRON HCL 4 MG/2ML IJ SOLN
4.0000 mg | Freq: Once | INTRAMUSCULAR | Status: DC | PRN
Start: 2015-04-08 — End: 2015-04-08

## 2015-04-08 MED ORDER — BUPIVACAINE-EPINEPHRINE 0.25% -1:200000 IJ SOLN
INTRAMUSCULAR | Status: DC | PRN
  Administered 2015-04-08: 5.5 mL

## 2015-04-08 MED ORDER — EPHEDRINE SULFATE 50 MG/ML IJ SOLN
INTRAMUSCULAR | Status: AC
  Filled 2015-04-08: qty 1

## 2015-04-08 MED ORDER — MIDAZOLAM HCL 5 MG/5ML IJ SOLN
INTRAMUSCULAR | Status: DC | PRN
  Administered 2015-04-08: 2 mg via INTRAVENOUS

## 2015-04-08 MED ORDER — LIDOCAINE HCL (CARDIAC) 20 MG/ML IV SOLN
INTRAVENOUS | Status: DC | PRN
  Administered 2015-04-08: 60 mg via INTRAVENOUS

## 2015-04-08 MED ORDER — GLYCOPYRROLATE 0.2 MG/ML IJ SOLN
INTRAMUSCULAR | Status: AC
  Filled 2015-04-08: qty 2

## 2015-04-08 MED ORDER — FENTANYL CITRATE (PF) 100 MCG/2ML IJ SOLN
INTRAMUSCULAR | Status: DC | PRN
  Administered 2015-04-08: 50 ug via INTRAVENOUS
  Administered 2015-04-08: 100 ug via INTRAVENOUS
  Administered 2015-04-08 (×2): 50 ug via INTRAVENOUS
  Administered 2015-04-08: 100 ug via INTRAVENOUS

## 2015-04-08 MED ORDER — EPHEDRINE SULFATE 50 MG/ML IJ SOLN
INTRAMUSCULAR | Status: DC | PRN
  Administered 2015-04-08 (×3): 5 mg via INTRAVENOUS

## 2015-04-08 MED ORDER — PROPOFOL 10 MG/ML IV BOLUS
INTRAVENOUS | Status: DC | PRN
  Administered 2015-04-08: 150 mg via INTRAVENOUS
  Administered 2015-04-08: 50 mg via INTRAVENOUS

## 2015-04-08 MED ORDER — FENTANYL CITRATE (PF) 100 MCG/2ML IJ SOLN
INTRAMUSCULAR | Status: AC
  Administered 2015-04-08: 50 ug via INTRAVENOUS
  Filled 2015-04-08: qty 2

## 2015-04-08 MED ORDER — LIDOCAINE HCL (CARDIAC) 20 MG/ML IV SOLN
INTRAVENOUS | Status: AC
  Filled 2015-04-08: qty 5

## 2015-04-08 MED ORDER — MIDAZOLAM HCL 2 MG/2ML IJ SOLN
INTRAMUSCULAR | Status: AC
  Filled 2015-04-08: qty 2

## 2015-04-08 MED ORDER — INSULIN ASPART 100 UNIT/ML ~~LOC~~ SOLN
8.0000 [IU] | Freq: Once | SUBCUTANEOUS | Status: AC
  Administered 2015-04-08: 8 [IU] via SUBCUTANEOUS

## 2015-04-08 MED ORDER — SUCCINYLCHOLINE CHLORIDE 20 MG/ML IJ SOLN
INTRAMUSCULAR | Status: DC | PRN
  Administered 2015-04-08: 110 mg via INTRAVENOUS

## 2015-04-08 MED ORDER — FENTANYL CITRATE (PF) 100 MCG/2ML IJ SOLN
INTRAMUSCULAR | Status: AC
  Filled 2015-04-08: qty 2

## 2015-04-08 MED ORDER — OXYCODONE-ACETAMINOPHEN 7.5-325 MG PO TABS: 1.0000 | ORAL_TABLET | ORAL | Status: DC | PRN

## 2015-04-08 MED ORDER — MIDAZOLAM HCL 2 MG/2ML IJ SOLN
INTRAMUSCULAR | Status: AC
  Filled 2015-04-08: qty 4

## 2015-04-08 MED ORDER — INSULIN ASPART 100 UNIT/ML ~~LOC~~ SOLN
SUBCUTANEOUS | Status: AC
  Filled 2015-04-08: qty 1

## 2015-04-08 MED ORDER — SODIUM CHLORIDE 0.9 % IJ SOLN
INTRAMUSCULAR | Status: AC
  Filled 2015-04-08: qty 10

## 2015-04-08 MED ORDER — ONDANSETRON HCL 4 MG/2ML IJ SOLN
INTRAMUSCULAR | Status: AC
  Filled 2015-04-08: qty 2

## 2015-04-08 MED ORDER — NEOSTIGMINE METHYLSULFATE 10 MG/10ML IV SOLN
INTRAVENOUS | Status: AC
  Filled 2015-04-08: qty 1

## 2015-04-08 MED ORDER — FENTANYL CITRATE (PF) 100 MCG/2ML IJ SOLN
25.0000 ug | INTRAMUSCULAR | Status: DC | PRN
  Administered 2015-04-08 (×2): 50 ug via INTRAVENOUS

## 2015-04-08 MED ORDER — ALBUTEROL SULFATE HFA 108 (90 BASE) MCG/ACT IN AERS
INHALATION_SPRAY | RESPIRATORY_TRACT | Status: DC | PRN
  Administered 2015-04-08: 3 via RESPIRATORY_TRACT

## 2015-04-08 MED ORDER — LACTATED RINGERS IV SOLN
INTRAVENOUS | Status: DC
  Administered 2015-04-08 (×2): via INTRAVENOUS

## 2015-04-08 MED ORDER — MIDAZOLAM HCL 2 MG/2ML IJ SOLN
2.0000 mg | Freq: Once | INTRAMUSCULAR | Status: AC
  Administered 2015-04-08: 2 mg via INTRAVENOUS

## 2015-04-08 MED ORDER — FENTANYL CITRATE (PF) 100 MCG/2ML IJ SOLN
100.0000 ug | Freq: Once | INTRAMUSCULAR | Status: AC
  Administered 2015-04-08: 100 ug via INTRAVENOUS

## 2015-04-08 MED ORDER — 0.9 % SODIUM CHLORIDE (POUR BTL) OPTIME
TOPICAL | Status: DC | PRN
  Administered 2015-04-08: 1000 mL

## 2015-04-08 SURGICAL SUPPLY — 53 items
BLADE SURG ROTATE 9660 (MISCELLANEOUS) ×2 IMPLANT
CANISTER SUCTION 2500CC (MISCELLANEOUS) ×2 IMPLANT
CHLORAPREP W/TINT 26ML (MISCELLANEOUS) ×2 IMPLANT
COVER SURGICAL LIGHT HANDLE (MISCELLANEOUS) ×2 IMPLANT
DRAPE LAPAROTOMY TRNSV 102X78 (DRAPE) ×2 IMPLANT
DRAPE UTILITY XL STRL (DRAPES) ×2 IMPLANT
ELECT BLADE 4.0 EZ CLEAN MEGAD (MISCELLANEOUS) ×2
ELECT CAUTERY BLADE 6.4 (BLADE) ×2 IMPLANT
ELECT REM PT RETURN 9FT ADLT (ELECTROSURGICAL) ×2
ELECTRODE BLDE 4.0 EZ CLN MEGD (MISCELLANEOUS) ×1 IMPLANT
ELECTRODE REM PT RTRN 9FT ADLT (ELECTROSURGICAL) ×1 IMPLANT
GAUZE SPONGE 4X4 16PLY XRAY LF (GAUZE/BANDAGES/DRESSINGS) ×2 IMPLANT
GLOVE BIO SURGEON STRL SZ7.5 (GLOVE) ×2 IMPLANT
GLOVE BIOGEL PI IND STRL 7.0 (GLOVE) ×2 IMPLANT
GLOVE BIOGEL PI IND STRL 7.5 (GLOVE) ×3 IMPLANT
GLOVE BIOGEL PI IND STRL 8 (GLOVE) ×2 IMPLANT
GLOVE BIOGEL PI INDICATOR 7.0 (GLOVE) ×2
GLOVE BIOGEL PI INDICATOR 7.5 (GLOVE) ×3
GLOVE BIOGEL PI INDICATOR 8 (GLOVE) ×2
GLOVE SURG SS PI 7.0 STRL IVOR (GLOVE) ×2 IMPLANT
GOWN STRL REUS W/ TWL LRG LVL3 (GOWN DISPOSABLE) ×3 IMPLANT
GOWN STRL REUS W/ TWL XL LVL3 (GOWN DISPOSABLE) ×2 IMPLANT
GOWN STRL REUS W/TWL LRG LVL3 (GOWN DISPOSABLE) ×3
GOWN STRL REUS W/TWL XL LVL3 (GOWN DISPOSABLE) ×2
KIT BASIN OR (CUSTOM PROCEDURE TRAY) ×2 IMPLANT
KIT ROOM TURNOVER OR (KITS) ×2 IMPLANT
LIQUID BAND (GAUZE/BANDAGES/DRESSINGS) ×2 IMPLANT
MESH PARIETEX PROGRIP LEFT (Mesh General) ×2 IMPLANT
NEEDLE HYPO 25GX1X1/2 BEV (NEEDLE) ×2 IMPLANT
NS IRRIG 1000ML POUR BTL (IV SOLUTION) ×2 IMPLANT
PACK SURGICAL SETUP 50X90 (CUSTOM PROCEDURE TRAY) ×2 IMPLANT
PAD ARMBOARD 7.5X6 YLW CONV (MISCELLANEOUS) ×4 IMPLANT
PENCIL BUTTON HOLSTER BLD 10FT (ELECTRODE) ×2 IMPLANT
SPECIMEN JAR SMALL (MISCELLANEOUS) ×2 IMPLANT
SPONGE INTESTINAL PEANUT (DISPOSABLE) IMPLANT
SPONGE LAP 18X18 X RAY DECT (DISPOSABLE) ×2 IMPLANT
SUT MNCRL AB 4-0 PS2 18 (SUTURE) ×2 IMPLANT
SUT PDS AB 0 CT 36 (SUTURE) IMPLANT
SUT PROLENE 2 0 SH DA (SUTURE) ×2 IMPLANT
SUT VIC AB 0 CT2 27 (SUTURE) ×2 IMPLANT
SUT VIC AB 2-0 CT1 36 (SUTURE) ×2 IMPLANT
SUT VIC AB 2-0 SH 27 (SUTURE) ×2
SUT VIC AB 2-0 SH 27X BRD (SUTURE) ×2 IMPLANT
SUT VIC AB 3-0 SH 27 (SUTURE) ×1
SUT VIC AB 3-0 SH 27XBRD (SUTURE) ×1 IMPLANT
SUT VICRYL AB 2 0 TIES (SUTURE) ×2 IMPLANT
SYR BULB 3OZ (MISCELLANEOUS) ×2 IMPLANT
SYR CONTROL 10ML LL (SYRINGE) ×2 IMPLANT
TOWEL OR 17X24 6PK STRL BLUE (TOWEL DISPOSABLE) ×2 IMPLANT
TOWEL OR 17X26 10 PK STRL BLUE (TOWEL DISPOSABLE) ×2 IMPLANT
TRAY FOLEY CATH 16FR SILVER (SET/KITS/TRAYS/PACK) ×2 IMPLANT
TUBE CONNECTING 12X1/4 (SUCTIONS) ×2 IMPLANT
YANKAUER SUCT BULB TIP NO VENT (SUCTIONS) ×2 IMPLANT

## 2015-04-08 NOTE — H&P (Signed)
History of Present Illness Nancy Ok MD; 12/17/2014 11:25 AM) Patient words: hernia.  The patient is a 44 year old female who presents with an inguinal hernia. Patient is a 44 year old female who is referred by Dr. Dianah Field for evaluation of a left inguinal hernia. The patient states this is been here for approximately a month. She states becoming more painful and bothersome. She states that she notices initially after lifting some furniture. She does state she is able to reduce it manually. She has not had any signs or symptoms of incarceration or strangulation. Of note the patient does have a history of COPD. She also has a history of diabetes. She also smokes approximately a pack a day. Patient sees Dr. Buddy Duty for her diabetic medication and Dr. Darene Lamer for COPD.   Allergies Elbert Ewings, CMA; 12/17/2014 10:53 AM) Metoprolol Succinate *BETA BLOCKERS* Shortness of breath. Montelukast Sodium *CHEMICALS* Shortness of breath. Morphine Sulfate *ANALGESICS - OPIOID* Penicillin V Potassium *PENICILLINS*  Medication History Elbert Ewings, CMA; 12/17/2014 10:59 AM) Albuterol Sulfate HFA (108 (90 Base)MCG/ACT Aerosol Soln, Inhalation) Active. Aspirin (81MG  Tablet, Oral) Active. Atorvastatin Calcium (80MG  Tablet, Oral) Active. Invokana (300MG  Tablet, Oral) Active. Welchol (625MG  Tablet, Oral) Active. Flexeril (10MG  Tablet, Oral) Active. Pepcid (20MG  Tablet, Oral as needed) Active. PROzac (40MG  Capsule, Oral) Active. Advair Diskus (500-50MCG/DOSE Aero Pow Br Act, Inhalation) Active. Lasix (40MG  Tablet, Oral) Active. Ibuprofen (200MG  Capsule, Oral as needed) Active. HumuLIN R U-500 (CONCENTRATED) (500UNIT/ML Solution, Subcutaneous) Active. LaMICtal ODT (200MG  Tablet Disperse, Oral) Active. Victoza (18MG /3ML Soln Pen-inj, Subcutaneous) Active. Lisinopril (10MG  Tablet, Oral daily) Active. Claritin (10MG  Tablet, Oral) Active. GlycoLax (Oral) Active. Lyrica (150MG  Capsule,  Oral) Active. SEROquel (400MG  Tablet, Oral) Active. Spiriva Respimat (2.5MCG/ACT Aerosol Soln, Inhalation) Active. TraMADol HCl (50MG  Tablet, Oral) Active. Medications Reconciled  Vitals Elbert Ewings CMA; 12/17/2014 11:00 AM) 12/17/2014 11:00 AM Weight: 247 lb Height: 64in Body Surface Area: 2.25 m Body Mass Index: 42.4 kg/m Temp.: 98.35F(Oral)  Pulse: 87 (Regular)  Resp.: 18 (Unlabored)  BP: 138/78 (Sitting, Left Arm, Standard)    Physical Exam Nancy Ok MD; 12/17/2014 11:24 AM) General Mental Status-Alert. General Appearance-Consistent with stated age. Hydration-Well hydrated. Voice-Normal.  Head and Neck Head-normocephalic, atraumatic with no lesions or palpable masses. Trachea-midline. Thyroid Gland Characteristics - normal size and consistency.  Chest and Lung Exam Chest and lung exam reveals -quiet, even and easy respiratory effort with no use of accessory muscles and on auscultation, normal breath sounds, no adventitious sounds and normal vocal resonance. Inspection Chest Wall - Normal. Back - normal.  Cardiovascular Cardiovascular examination reveals -normal heart sounds, regular rate and rhythm with no murmurs and normal pedal pulses bilaterally.  Abdomen Inspection Skin - Scar - no surgical scars. Hernias - Inguinal hernia - Left - Reducible. Palpation/Percussion Normal exam - Soft, Non Tender, No Rebound tenderness, No Rigidity (guarding) and No hepatosplenomegaly. Auscultation Normal exam - Bowel sounds normal.    Assessment & Plan Nancy Ok MD; 12/17/2014 11:27 AM) LEFT INGUINAL HERNIA (550.90  K40.90) Impression: 44 year old female with a left inguinal hernia.  1.  We Will proceed with open left inguinal hernia repair with mesh. I discussed with patient that this could potentially be done from a local/Mac perspective. If generalize anesthesia is required there is a possibility the patient can require prolonged  intubation and ICU management. 2. All risks and benefits were discussed with the patient to generally include, but not limited to: infection, bleeding, damage to surrounding structures, acute and chronic nerve pain, and recurrence. Alternatives were offered  and described. All questions were answered and the patient voiced understanding of the procedure and wishes to proceed at this point with hernia repair.

## 2015-04-08 NOTE — Anesthesia Procedure Notes (Addendum)
Anesthesia Regional Block:  TAP block  Pre-Anesthetic Checklist: ,, timeout performed, Correct Patient, Correct Site, Correct Laterality, Correct Procedure, Correct Position, site marked, Risks and benefits discussed,  Surgical consent,  Pre-op evaluation,  At surgeon's request and post-op pain management  Laterality: Left  Prep: chloraprep       Needles:  Injection technique: Single-shot  Needle Type: Echogenic Stimulator Needle     Needle Length: 9cm 9 cm Needle Gauge: 22 and 22 G    Additional Needles:  Procedures: ultrasound guided (picture in chart) TAP block Narrative:  Start time: 04/08/2015 12:10 PM End time: 04/08/2015 12:15 PM Injection made incrementally with aspirations every 5 mL.  Performed by: Personally   Additional Notes: 30 cc 0.5% Bupivacaine injected easily   Procedure Name: Intubation Date/Time: 04/08/2015 1:53 PM Performed by: Manuela Schwartz B Pre-anesthesia Checklist: Patient identified, Emergency Drugs available, Suction available, Patient being monitored and Timeout performed Patient Re-evaluated:Patient Re-evaluated prior to inductionOxygen Delivery Method: Circle system utilized Preoxygenation: Pre-oxygenation with 100% oxygen Intubation Type: IV induction and Rapid sequence Laryngoscope Size: Mac and 3 Grade View: Grade I Tube type: Oral Tube size: 7.5 mm Number of attempts: 1 Airway Equipment and Method: Stylet Placement Confirmation: ETT inserted through vocal cords under direct vision,  positive ETCO2 and breath sounds checked- equal and bilateral Secured at: 21 cm Tube secured with: Tape Dental Injury: Teeth and Oropharynx as per pre-operative assessment

## 2015-04-08 NOTE — Discharge Instructions (Signed)
[facility] _______[facility]  NGUINAL HERNIA REPAIR: POST OP INSTRUCTIONS  Always review your discharge instruction sheet given to you by the facility where your surgery was performed. IF YOU HAVE DISABILITY OR FAMILY LEAVE FORMS, YOU MUST BRING THEM TO THE OFFICE FOR PROCESSING.   DO NOT GIVE THEM TO YOUR DOCTOR.  1. A  prescription for pain medication may be given to you upon discharge.  Take your pain medication as prescribed, if needed.  If narcotic pain medicine is not needed, then you may take acetaminophen (Tylenol) or ibuprofen (Advil) as needed. 2. Take your usually prescribed medications unless otherwise directed. 3. If you need a refill on your pain medication, please contact your pharmacy.  They will contact our office to request authorization. Prescriptions will not be filled after 5 pm or on week-ends. 4. You should follow a light diet the first 24 hours after arrival home, such as soup and crackers, etc.  Be sure to include lots of fluids daily.  Resume your normal diet the day after surgery. 5. Most patients will experience some swelling and bruising around the umbilicus or in the groin and scrotum.  Ice packs and reclining will help.  Swelling and bruising can take several days to resolve.  6. It is common to experience some constipation if taking pain medication after surgery.  Increasing fluid intake and taking a stool softener (such as Colace) will usually help or prevent this problem from occurring.  A mild laxative (Milk of Magnesia or Miralax) should be taken according to package directions if there are no bowel movements after 48 hours. 7. Unless discharge instructions indicate otherwise, you may remove your bandages 24-48 hours after surgery, and you may shower at that time.  You may have steri-strips (small skin tapes) in place directly over the incision.  These strips should be left on the skin for 7-10 days.  If your surgeon used skin glue on the incision, you may  shower in 24 hours.  The glue will flake off over the next 2-3 weeks.  Any sutures or staples will be removed at the office during your follow-up visit. 8. ACTIVITIES:  You may resume regular (light) daily activities beginning the next day--such as daily self-care, walking, climbing stairs--gradually increasing activities as tolerated.  You may have sexual intercourse when it is comfortable.  Refrain from any heavy lifting or straining until approved by your doctor. a. You may drive when you are no longer taking prescription pain medication, you can comfortably wear a seatbelt, and you can safely maneuver your car and apply brakes. b. RETURN TO WORK:  __________________________________________________________ 9. You should see your doctor in the office for a follow-up appointment approximately 2-3 weeks after your surgery.  Make sure that you call for this appointment within a day or two after you arrive home to insure a convenient appointment time. 10. OTHER INSTRUCTIONS:  __________________________________________________________________________________________________________________________________________________________________________________________  WHEN TO CALL YOUR DOCTOR: 1. Fever over 101.0 2. Inability to urinate 3. Nausea and/or vomiting 4. Extreme swelling or bruising 5. Continued bleeding from incision. 6. Increased pain, redness, or drainage from the incision  The clinic staff is available to answer your questions during regular business hours.  Please dont hesitate to call and ask to speak to one of the nurses for clinical concerns.  If you have a medical emergency, go to the nearest emergency room or call 911.  A surgeon from [facility] is always on call at the hospital   [address], [address], [place], [place]  [address] ? ° [address] [address], [place], [place]   [address] °[phone] ? [phone] ? FAX [phone] °Web site:  [email or web address] ° °

## 2015-04-08 NOTE — Op Note (Signed)
04/08/2015  3:26 PM  PATIENT:  [name]  44 y.o. female  PRE-OPERATIVE DIAGNOSIS:  LEFT INGUINAL HERNIA  POST-OPERATIVE DIAGNOSIS:  LEFT INDIRECT INGUINAL HERNIA  PROCEDURE:  Procedure(s): OPEN LEFT INGUINAL HERNIA REPAIR WITH MESH (Left) INSERTION OF MESH (Left)  SURGEON:  Surgeon(s) and Role:    * Ralene Ok, MD - Primary  ASSISTANTS: none   ANESTHESIA:   local and general  EBL:  Total I/O In: 1000 [I.V.:1000] Out: 200 [Urine:200]  BLOOD ADMINISTERED:none  DRAINS: none   LOCAL MEDICATIONS USED:  BUPIVICAINE   SPECIMEN:  No Specimen  DISPOSITION OF SPECIMEN:  N/A  COUNTS:  YES  TOURNIQUET:  * No tourniquets in log *  DICTATION: .Dragon Dictation  Details of the procedure: The patient was taken back to the operating room. The patient was placed in supine position with bilateral SCDs in place. The patient was prepped and draped in the usual sterile fashion.  After appropriate anitbiotics were confirmed, a time-out was confirmed and all facts were verified.  Quarter percent Marcaine was used to infiltrate the area of the incision and an ilioinguinal nerve block was also placed.   A 6 cm incision was made just 1 cm superior to the inguinal ligament. Bovie cautery was used to maintain hemostasis dissection is carried down to the external oblique.  A standard incision was made laterally, and the external oblique was bluntly dissected away from the surrounding tissue with Metzenbaum scissors. The external oblique was elevated dissected away from the surrounding tissue.    At this point secondary to the patient's body habitus this created difficult dissection of the internal oblique. At this time I incised the internal oblique and entered the preperitoneal space at this time it was apparent that there was a indirect inguinal hernia lateral to the vessels. This was fat containing. This followed the round ligament tract. At this time the round ligament was divided and  suture-ligated using 0 Vicryl.  At this time the hernia was fully reduced. There was prepared fat within the hernia. At this time the internal oblique was reapproximated in a standard running fashion using a 2-0 Vicryl. This allowed [place] to clear off the shelving edge of the external oblique. At this time a piece of appropriate mesh left-sided was placed over the pubic tubercle. This was sutured using a 2-0 Prolene in interrupted fashion 1. This was simply laid under the external oblique. Mesh was flat from medial to laterally. The lateral portion of the mesh covered the hernia defect appropriately.  At this time external oblique was reapproximated using a 2-0 Vicryl in a running fashion. Scarpa's fascia was then reapproximated using a 3-0 Vicryl running fashion. The skin was then reapproximated with 4 Monocryl in a subcuticular fashion. The skin was then dressed with Dermabond.  The patient was taken to the recovery room in stable condition.   PLAN OF CARE: Discharge to home after PACU  PATIENT DISPOSITION:  PACU - hemodynamically stable.   Delay start of Pharmacological VTE agent (>24hrs) due to surgical blood loss or risk of bleeding: not applicable

## 2015-04-08 NOTE — Anesthesia Preprocedure Evaluation (Addendum)
Anesthesia Evaluation  Patient identified by MRN, date of birth, ID band Patient awake    Reviewed: Allergy & Precautions, NPO status , Patient's Chart, lab work & pertinent test results  History of Anesthesia Complications Negative for: history of anesthetic complications  Airway Mallampati: II  TM Distance: >3 FB Neck ROM: Full    Dental  (+) Edentulous Upper, Edentulous Lower   Pulmonary asthma , COPD,  COPD inhaler and oxygen dependent, Current Smoker,    breath sounds clear to auscultation (-) wheezing      Cardiovascular hypertension, Pt. on medications + Peripheral Vascular Disease   Rhythm:Regular Rate:Normal     Neuro/Psych Seizures -, Well Controlled,  PSYCHIATRIC DISORDERS Anxiety Bipolar Disorder TIA Neuromuscular disease CVA, Residual Symptoms    GI/Hepatic   Endo/Other  diabetes, Type 2, Insulin Dependent  Renal/GU      Musculoskeletal   Abdominal   Peds  Hematology   Anesthesia Other Findings   Reproductive/Obstetrics                          Anesthesia Physical Anesthesia Plan  ASA: III  Anesthesia Plan: General   Post-op Pain Management: GA combined w/ Regional for post-op pain   Induction: Intravenous  Airway Management Planned: Oral ETT  Additional Equipment:   Intra-op Plan:   Post-operative Plan: Extubation in OR  Informed Consent: I have reviewed the patients History and Physical, chart, labs and discussed the procedure including the risks, benefits and alternatives for the proposed anesthesia with the patient or authorized representative who has indicated his/her understanding and acceptance.     Plan Discussed with: CRNA, Surgeon and Anesthesiologist  Anesthesia Plan Comments:        Anesthesia Quick Evaluation

## 2015-04-08 NOTE — Transfer of Care (Signed)
Immediate Anesthesia Transfer of Care Note  Patient: [name]  Procedure(s) Performed: Procedure(s): OPEN LEFT INGUINAL HERNIA REPAIR WITH MESH (Left) INSERTION OF MESH (Left)  Patient Location: PACU  Anesthesia Type:General  Level of Consciousness: awake  Airway & Oxygen Therapy: Patient Spontanous Breathing and Patient connected to face mask oxygen  Post-op Assessment: Report given to RN and Post -op Vital signs reviewed and stable  Post vital signs: Reviewed and stable  Last Vitals:  Filed Vitals:   04/08/15 1619  BP: 135/59  Pulse: 115  Temp:   Resp: 18    Complications: No apparent anesthesia complications

## 2015-04-09 ENCOUNTER — Encounter (HOSPITAL_COMMUNITY): Payer: Self-pay | Admitting: General Surgery

## 2015-04-09 NOTE — Anesthesia Postprocedure Evaluation (Signed)
  Anesthesia Post-op Note  Patient: [name]  Procedure(s) Performed: Procedure(s): OPEN LEFT INGUINAL HERNIA REPAIR WITH MESH (Left) INSERTION OF MESH (Left)  Patient Location: PACU  Anesthesia Type:General and Regional  Level of Consciousness: awake  Airway and Oxygen Therapy: Patient Spontanous Breathing  Post-op Pain: moderate  Post-op Assessment: Post-op Vital signs reviewed, Patient's Cardiovascular Status Stable, Respiratory Function Stable, Patent Airway, No signs of Nausea or vomiting and Pain level controlled              Post-op Vital Signs: Reviewed and stable  Last Vitals:  Filed Vitals:   04/08/15 1749  BP:   Pulse: 108  Temp: 37 C  Resp: 22    Complications: No apparent anesthesia complications

## 2015-04-22 ENCOUNTER — Ambulatory Visit: Payer: BLUE CROSS/BLUE SHIELD | Admitting: Podiatry

## 2015-04-24 ENCOUNTER — Encounter: Payer: Self-pay | Admitting: Family Medicine

## 2015-04-24 ENCOUNTER — Telehealth: Payer: Self-pay | Admitting: Family Medicine

## 2015-04-24 ENCOUNTER — Ambulatory Visit (HOSPITAL_COMMUNITY)
Admission: RE | Admit: 2015-04-24 | Discharge: 2015-04-24 | Disposition: A | Discharge status: Home / Self Care | Payer: BLUE CROSS/BLUE SHIELD | Source: Ambulatory Visit | Attending: Cardiology | Admitting: Cardiology

## 2015-04-24 ENCOUNTER — Ambulatory Visit (INDEPENDENT_AMBULATORY_CARE_PROVIDER_SITE_OTHER): Payer: BLUE CROSS/BLUE SHIELD | Admitting: Family Medicine

## 2015-04-24 VITALS — BP 142/63 | HR 101 | Temp 97.9°F | Ht 65.0 in | Wt 259.3 lb

## 2015-04-24 DIAGNOSIS — E785 Hyperlipidemia, unspecified: Secondary | ICD-10-CM | POA: Insufficient documentation

## 2015-04-24 DIAGNOSIS — J449 Chronic obstructive pulmonary disease, unspecified: Secondary | ICD-10-CM | POA: Diagnosis not present

## 2015-04-24 DIAGNOSIS — I10 Essential (primary) hypertension: Secondary | ICD-10-CM | POA: Diagnosis not present

## 2015-04-24 DIAGNOSIS — R600 Localized edema: Secondary | ICD-10-CM

## 2015-04-24 DIAGNOSIS — I878 Other specified disorders of veins: Secondary | ICD-10-CM

## 2015-04-24 DIAGNOSIS — E669 Obesity, unspecified: Secondary | ICD-10-CM | POA: Diagnosis not present

## 2015-04-24 DIAGNOSIS — E119 Type 2 diabetes mellitus without complications: Secondary | ICD-10-CM | POA: Insufficient documentation

## 2015-04-24 DIAGNOSIS — Z9889 Other specified postprocedural states: Secondary | ICD-10-CM | POA: Insufficient documentation

## 2015-04-24 DIAGNOSIS — Z8673 Personal history of transient ischemic attack (TIA), and cerebral infarction without residual deficits: Secondary | ICD-10-CM | POA: Insufficient documentation

## 2015-04-24 LAB — CBC
HCT: 37.5 % (ref 36.0–46.0)
Hemoglobin: 11.9 g/dL — ABNORMAL LOW (ref 12.0–15.0)
MCH: 28.8 pg (ref 26.0–34.0)
MCHC: 31.7 g/dL (ref 30.0–36.0)
MCV: 90.8 fL (ref 78.0–100.0)
MPV: 11.1 fL (ref 8.6–12.4)
Platelets: 325 10*3/uL (ref 150–400)
RBC: 4.13 MIL/uL (ref 3.87–5.11)
RDW: 15.3 % (ref 11.5–15.5)
WBC: 10.9 10*3/uL — ABNORMAL HIGH (ref 4.0–10.5)

## 2015-04-24 LAB — BASIC METABOLIC PANEL
BUN: 18 mg/dL (ref 7–25)
CO2: 28 mmol/L (ref 20–31)
Calcium: 9 mg/dL (ref 8.6–10.2)
Chloride: 93 mmol/L — ABNORMAL LOW (ref 98–110)
Creat: 1.12 mg/dL — ABNORMAL HIGH (ref 0.50–1.10)
Glucose, Bld: 443 mg/dL — ABNORMAL HIGH (ref 65–99)
Potassium: 4.4 mmol/L (ref 3.5–5.3)
Sodium: 133 mmol/L — ABNORMAL LOW (ref 135–146)

## 2015-04-24 NOTE — Telephone Encounter (Signed)
Left voicemail for patient that her LE doppler U/S was negative for DVT bilaterally.

## 2015-04-24 NOTE — Patient Instructions (Signed)
Thank you for coming in today, it was so nice meeting you!  Today we talked about your leg swelling and redness. I think your symptoms are most likely due to a Deep vein thrombosis (blood clots) in both legs. Sometimes this can happen after surgery. You will need to get an ultrasound of both legs in order to determine if there are clots in your legs. I have given you a paper with the information.  If you do not have clots in your legs, then you can just elevate your legs and use compression stockings. If the redness/swelling/pain gets worse or you become short of breath with chest pain, please go to the ED.   If you have any questions or concerns, please do not hesitate to call the office at [phone].  Sincerely,  Smitty Cords, MD    Deep Vein Thrombosis A deep vein thrombosis (DVT) is a blood clot (thrombus) that usually occurs in a deep, larger vein of the lower leg or the pelvis, or in an upper extremity such as the arm. These are dangerous and can lead to serious and even life-threatening complications if the clot travels to the lungs. A DVT can damage the valves in your leg veins so that instead of flowing upward, the blood pools in the lower leg. This is called post-thrombotic syndrome, and it can result in pain, swelling, discoloration, and sores on the leg. CAUSES A DVT is caused by the formation of a blood clot in your leg, pelvis, or arm. Usually, several things contribute to the formation of blood clots. A clot may develop when:  Your blood flow slows down.  Your vein becomes damaged in some way.  You have a condition that makes your blood clot more easily. RISK FACTORS A DVT is more likely to develop in:  People who are older, especially over 28 years of age.  People who are overweight (obese).  People who sit or lie still for a long time, such as during long-distance travel (over 4 hours), bed rest, hospitalization, or during recovery from certain medical  conditions like a stroke.  People who do not engage in much physical activity (sedentary lifestyle).  People who have chronic breathing disorders.  People who have a personal or family history of blood clots or blood clotting disease.  People who have peripheral vascular disease (PVD), diabetes, or some types of cancer.  People who have heart disease, especially if the person had a recent heart attack or has congestive heart failure.  People who have neurological diseases that affect the legs (leg paresis).  People who have had a traumatic injury, such as breaking a hip or leg.  People who have recently had major or lengthy surgery, especially on the hip, knee, or abdomen.  People who have had a central line placed inside a large vein.  People who take medicines that contain the hormone estrogen. These include birth control pills and hormone replacement therapy.  Pregnancy or during childbirth or the postpartum period.  Long plane flights (over 8 hours). SIGNS AND SYMPTOMS Symptoms of a DVT can include:   Swelling of your leg or arm, especially if one side is much worse.  Warmth and redness of your leg or arm, especially if one side is much worse.  Pain in your arm or leg. If the clot is in your leg, symptoms may be more noticeable or worse when you stand or walk.  A feeling of pins and needles, if the clot is in  the arm. The symptoms of a DVT that has traveled to the lungs (pulmonary embolism, PE) usually start suddenly and include:  Shortness of breath while active or at rest.  Coughing or coughing up blood or blood-tinged mucus.  Chest pain that is often worse with deep breaths.  Rapid or irregular heartbeat.  Feeling light-headed or dizzy.  Fainting.  Feeling anxious.  Sweating. There may also be pain and swelling in a leg if that is where the blood clot started. These symptoms may represent a serious problem that is an emergency. Do not wait to see if the  symptoms will go away. Get medical help right away. Call your local emergency services (911 in the [place] Do not drive yourself to the hospital. DIAGNOSIS Your health care provider will take a medical history and perform a physical exam. You may also have other tests, including:  Blood tests to assess the clotting properties of your blood.  Imaging tests, such as CT, ultrasound, MRI, X-ray, and other tests to see if you have clots anywhere in your body. TREATMENT After a DVT is identified, it can be treated. The type of treatment that you receive depends on many factors, such as the cause of your DVT, your risk for bleeding or developing more clots, and other medical conditions that you have. Sometimes, a combination of treatments is necessary. Treatment options may be combined and include:  Monitoring the blood clot with ultrasound.  Taking medicines by mouth, such as newer blood thinners (anticoagulants), thrombolytics, or warfarin.  Taking anticoagulant medicine by injection or through an IV tube.  Wearing compression stockings or using different types ofdevices.  Surgery (rare) to remove the blood clot or to place a filter in your abdomen to stop the blood clot from traveling to your lungs. Treatments for a DVT are often divided into immediate treatment and long-term treatment (up to 3 months after DVT). You can work with your health care provider to choose the treatment program that is best for you. HOME CARE INSTRUCTIONS If you are taking a newer oral anticoagulant:  Take the medicine every single day at the same time each day.  Understand what foods and drugs interact with this medicine.  Understand that there are no regular blood tests required when using this medicine.  Understand the side effects of this medicine, including excessive bruising or bleeding. Ask your health care provider or pharmacist about other possible side effects. If you are taking warfarin:  Understand  how to take warfarin and know which foods can affect how warfarin works in Veterinary surgeon.  Understand that it is dangerous to take too much or too little warfarin. Too much warfarin increases the risk of bleeding. Too little warfarin continues to allow the risk for blood clots.  Follow your PT and INR blood testing schedule. The PT and INR results allow your health care provider to adjust your dose of warfarin. It is very important that you have your PT and INR tested as often as told by your health care provider.  Avoid major changes in your diet, or tell your health care provider before you change your diet. Arrange a visit with a registered dietitian to answer your questions. Many foods, especially foods that are high in vitamin K, can interfere with warfarin and affect the PT and INR results. Eat a consistent amount of foods that are high in vitamin K, such as:  Spinach, kale, broccoli, cabbage, collard greens, turnip greens, Brussels sprouts, peas, cauliflower, seaweed, and parsley.  Beef liver and pork liver.  Green tea.  Soybean oil.  Tell your health care provider about any and all medicines, vitamins, and supplements that you take, including aspirin and other over-the-counter anti-inflammatory medicines. Be especially cautious with aspirin and anti-inflammatory medicines. Do not take those before you ask your health care provider if it is safe to do so. This is important because many medicines can interfere with warfarin and affect the PT and INR results.  Do not start or stop taking any over-the-counter or prescription medicine unless your health care provider or pharmacist tells you to do so. If you take warfarin, you will also need to do these things:  Hold pressure over cuts for longer than usual.  Tell your dentist and other health care providers that you are taking warfarin before you have any procedures in which bleeding may occur.  Avoid alcohol or drink very small amounts. Tell  your health care provider if you change your alcohol intake.  Do not use tobacco products, including cigarettes, chewing tobacco, and e-cigarettes. If you need help quitting, ask your health care provider.  Avoid contact sports. General Instructions  Take over-the-counter and prescription medicines only as told by your health care provider. Anticoagulant medicines can have side effects, including easy bruising and difficulty stopping bleeding. If you are prescribed an anticoagulant, you will also need to do these things:  Hold pressure over cuts for longer than usual.  Tell your dentist and other health care providers that you are taking anticoagulants before you have any procedures in which bleeding may occur.  Avoid contact sports.  Wear a medical alert bracelet or carry a medical alert card that says you have had a PE.  Ask your health care provider how soon you can go back to your normal activities. Stay active to prevent new blood clots from forming.  Make sure to exercise while traveling or when you have been sitting or standing for a long period of time. It is very important to exercise. Exercise your legs by walking or by tightening and relaxing your leg muscles often. Take frequent walks.  Wear compression stockings as told by your health care provider to help prevent more blood clots from forming.  Do not use tobacco products, including cigarettes, chewing tobacco, and e-cigarettes. If you need help quitting, ask your health care provider.  Keep all follow-up appointments with your health care provider. This is important. PREVENTION Take these actions to decrease your risk of developing another DVT:  Exercise regularly. For at least 30 minutes every day, engage in:  Activity that involves moving your arms and legs.  Activity that encourages good blood flow through your body by increasing your heart rate.  Exercise your arms and legs every hour during long-distance travel  (over 4 hours). Drink plenty of water and avoid drinking alcohol while traveling.  Avoid sitting or lying in bed for long periods of time without moving your legs.  Maintain a weight that is appropriate for your height. Ask your health care provider what weight is healthy for you.  If you are a woman who is over 85 years of age, avoid unnecessary use of medicines that contain estrogen. These include birth control pills.  Do not smoke, especially if you take estrogen medicines. If you need help quitting, ask your health care provider. If you are hospitalized, prevention measures may include:  Early walking after surgery, as soon as your health care provider says that it is safe.  Receiving anticoagulants to  prevent blood clots.If you cannot take anticoagulants, other options may be available, such as wearing compression stockings or using different types of devices. SEEK IMMEDIATE MEDICAL CARE IF:  You have new or increased pain, swelling, or redness in an arm or leg.  You have numbness or tingling in an arm or leg.  You have shortness of breath while active or at rest.  You have chest pain.  You have a rapid or irregular heartbeat.  You feel light-headed or dizzy.  You cough up blood.  You notice blood in your vomit, bowel movement, or urine. These symptoms may represent a serious problem that is an emergency. Do not wait to see if the symptoms will go away. Get medical help right away. Call your local emergency services (911 in the [place] Do not drive yourself to the hospital.   This information is not intended to replace advice given to you by your health care provider. Make sure you discuss any questions you have with your health care provider.   Document Released: 06/27/2005 Document Revised: 03/18/2015 Document Reviewed: 10/22/2014 Elsevier Interactive Patient Education [employer].

## 2015-04-24 NOTE — Progress Notes (Signed)
Subjective:    Patient ID: [name] , female   DOB: [birth year] , 44 y.o..   MRN: [id number]  HPI  [name] is here for leg redness. Patient states that she started to notice redness and pain in both legs 3 days ago and it has been getting progressively worse. Other symptoms include leg "tightness", warmth, swelling, decreased sensation in her toes, and  "tingling". The redness is worse in her feet but spreads up to her knees. No alleviating factors identified. Aggravating factors include walking. Of note, patient had surgery from a hernia repair surgery on 04/08/15 and she has not been very mobile. Patient does have a history of blood clots in her left leg about 4 years ago. She also was seen in our clinic on 03/29/15 for venous stasis.  Denies any use of new soaps/detergents, no recent illness, no sick contacts, no recent insect bites, no new foods. Denies fever, dizziness, chest pain, or shortness of breath   Review of Systems: Per HPI. All other systems reviewed and are negative.   Past Medical History: Patient Active Problem List   Diagnosis Date Noted  . Venous stasis of both lower extremities 03/29/2015  . Preventative health care 02/04/2015  . Essential hypertension 01/02/2015  . Inguinal hernia 11/27/2014  . Allergic rhinitis with postnasal drip 01/21/2014  . Aortic valve disorders 04/18/2013  . Numbness and tingling 04/01/2013  . Sleep apnea 05/22/2012  . Seizure disorder ([place]) 10/09/2011  . Bipolar disorder ([place]) 10/09/2011  . TIA (transient ischemic attack) 06/22/2011  . Diabetic polyneuropathy ([place]) 06/08/2010  . HYPERTRIGLYCERIDEMIA 07/17/2007  . Anxiety state, unspecified 05/30/2007  . COPD 05/30/2007  . OBESITY NOS 04/23/2007  . DM (diabetes mellitus), type 2, uncontrolled ([place]) 03/22/2007  . TOBACCO DEPENDENCE 09/07/2006  . ASTHMA, PERSISTENT 09/07/2006    Medications: reviewed and updated Current Outpatient Prescriptions  Medication Sig Dispense  Refill  . albuterol (PROVENTIL HFA;VENTOLIN HFA) 108 (90 BASE) MCG/ACT inhaler Inhale 2 puffs into the lungs every 4 (four) hours as needed. For shortness of breath 1 Inhaler 2  . aspirin EC 81 MG tablet Take 81 mg by mouth every morning.    Marland Kitchen atorvastatin (LIPITOR) 80 MG tablet Take 1 tablet (80 mg total) by mouth every evening. 30 tablet 12  . Canagliflozin (INVOKANA) 300 MG TABS Take 300 mg by mouth daily.    . clonazePAM (KLONOPIN) 1 MG tablet Take 1 mg by mouth 2 (two) times daily.    . colesevelam (WELCHOL) 625 MG tablet Take 1,250 mg by mouth 2 (two) times daily with a meal.    . cyclobenzaprine (FLEXERIL) 10 MG tablet Take 1 tablet (10 mg total) by mouth 3 (three) times daily as needed for muscle spasms. 30 tablet 0  . famotidine (PEPCID) 20 MG tablet Take 20 mg by mouth 2 (two) times daily.    Marland Kitchen FLUoxetine (PROZAC) 40 MG capsule Take 40 mg by mouth daily.     . Fluticasone-Salmeterol (ADVAIR DISKUS) 500-50 MCG/DOSE AEPB Inhale 1 puff into the lungs daily. 60 each   . furosemide (LASIX) 40 MG tablet Take 1 tablet (40 mg total) by mouth daily. 90 tablet 3  . HYDROcodone-acetaminophen (NORCO) 5-325 MG per tablet Take 1 tablet by mouth 3 (three) times daily. 30 tablet 0  . ibuprofen (ADVIL,MOTRIN) 200 MG tablet Take 600 mg by mouth 2 (two) times daily as needed.    . insulin regular human CONCENTRATED (HUMULIN R) 500 UNIT/ML SOLN injection Inject 100-180 Units into the  skin 2 (two) times daily with a meal. 180 units twice daily if sugar running between 200 to 300.    Marland Kitchen lamoTRIgine (LAMICTAL) 200 MG tablet Take 200 mg by mouth at bedtime.    . Liraglutide (VICTOZA) 18 MG/3ML SOPN Inject 1.8 mg into the skin daily.     Marland Kitchen lisinopril (PRINIVIL,ZESTRIL) 10 MG tablet Take 1 tablet (10 mg total) by mouth at bedtime. 90 tablet 1  . loratadine (CLARITIN) 10 MG tablet Take 1 tablet (10 mg total) by mouth daily. 30 tablet 11  . oxyCODONE-acetaminophen (PERCOCET) 7.5-325 MG tablet Take 1 tablet by mouth  every 4 (four) hours as needed for severe pain. 30 tablet 0  . polyethylene glycol powder (GLYCOLAX/MIRALAX) powder Take 1 Container by mouth daily as needed for moderate constipation.     . pregabalin (LYRICA) 150 MG capsule Take 1 capsule (150 mg total) by mouth 2 (two) times daily. 60 capsule 5  . QUEtiapine (SEROQUEL XR) 400 MG 24 hr tablet Take 800 mg by mouth at bedtime.    . Tiotropium Bromide Monohydrate (SPIRIVA RESPIMAT) 2.5 MCG/ACT AERS Inhale 2 puffs into the lungs daily. 1 Inhaler 3   No current facility-administered medications for this visit.   Social History:  reports that she has been smoking Cigarettes.  She has a 28 pack-year smoking history. She has never used smokeless tobacco.    Objective:  Physical Exam  Gen: NAD, alert, cooperative with exam, obese HEENT: NCAT, PERRL, clear conjunctiva, oropharynx clear, supple neck CV: RRR, normal S1/S2, no murmur, no edema, capillary refill brisk. 2+ dorsalis pedis and popliteal pulses Resp: CTABL, no wheezes, non-labored Skin: erythema of bilateral lower extremities from the knees down Psych: good insight, alert and oriented Extremities: 1+ pitting edema in bilateral lower limbs from feet to mid calf, increased warmth felt on palpation of erythematous area. Negative Homans sign    LE Doppler U/S: Negative for deep or superficial thrombosis in bilateral lower extremities  Assessment & Plan:  Venous stasis of both lower extremities Examination and presentation was concerning for DVT especially due to risk factors; Diabetes, tobacco use, COPD, hyperlipidemia, TIA/CVA past 6 months, hypertension, history of previous blood clots, and surgery on 04/08/15. WELLS score: 2. Patient's LE venous ultrasound did not show signs of thrombosis. Patient most likely has venous stasis secondary to not ambulating very much since her surgery. She was encouraged to ambulate more, elevate her feet while sitting, and wear compression stockings. Return  precautions were discussed.    Smitty Cords, MD [place], PGY-1

## 2015-04-24 NOTE — Assessment & Plan Note (Addendum)
Examination and presentation was concerning for DVT especially due to risk factors; Diabetes, tobacco use, COPD, hyperlipidemia, TIA/CVA past 6 months, hypertension, history of previous blood clots, and surgery on 04/08/15. WELLS score: 2. CBC and BMP results pending. Patient's LE venous ultrasound did not show signs of thrombosis. Patient most likely has venous stasis secondary to not ambulating very much since her surgery. She was encouraged to ambulate more, elevate her feet while sitting, and wear compression stockings. Return precautions were discussed.

## 2015-04-25 ENCOUNTER — Telehealth: Payer: Self-pay | Admitting: Obstetrics and Gynecology

## 2015-04-25 ENCOUNTER — Telehealth: Payer: Self-pay | Admitting: Family Medicine

## 2015-04-25 NOTE — Telephone Encounter (Signed)
Family Medicine Emergency Line Telephone Note   Received call from [facility] lab stating that on BMP patient's glucose was 443.   Let Dr. Juanito Doom know as she is on call and ordered the test yesterday. She is to follow-up with patient.    Luiz Blare, DO 04/25/2015, 8:16 AM PGY-2, [place]

## 2015-04-25 NOTE — Telephone Encounter (Signed)
Spoke with patient on phone about her high glucose level found on BMP. She agreed to make an appointment to see Dr. Lajuana Ripple to discuss her diabetes which is seemingly uncontrolled.

## 2015-04-27 ENCOUNTER — Other Ambulatory Visit: Payer: Self-pay | Admitting: *Deleted

## 2015-04-27 ENCOUNTER — Other Ambulatory Visit: Payer: Self-pay | Admitting: Family Medicine

## 2015-04-27 DIAGNOSIS — E1142 Type 2 diabetes mellitus with diabetic polyneuropathy: Secondary | ICD-10-CM

## 2015-04-27 MED ORDER — PREGABALIN 150 MG PO CAPS: 150.0000 mg | ORAL_CAPSULE | Freq: Two times a day (BID) | ORAL | Status: DC

## 2015-04-27 NOTE — Telephone Encounter (Signed)
Signed electronically.  Please call pharmacy and refill Lyrica 150mg  with 5 refills.

## 2015-04-27 NOTE — Telephone Encounter (Signed)
Contacted pharmacy to call in the Rx below. Katharina Caper, April D, [place]

## 2015-04-30 ENCOUNTER — Telehealth: Payer: Self-pay | Admitting: Family Medicine

## 2015-04-30 ENCOUNTER — Encounter (HOSPITAL_COMMUNITY): Payer: Self-pay

## 2015-04-30 ENCOUNTER — Emergency Department (HOSPITAL_COMMUNITY)
Admission: EM | Admit: 2015-04-30 | Discharge: 2015-05-01 | Disposition: A | Discharge status: Home / Self Care | Payer: BLUE CROSS/BLUE SHIELD | Attending: Emergency Medicine | Admitting: Emergency Medicine

## 2015-04-30 DIAGNOSIS — Z720 Tobacco use: Secondary | ICD-10-CM | POA: Diagnosis not present

## 2015-04-30 DIAGNOSIS — Z8673 Personal history of transient ischemic attack (TIA), and cerebral infarction without residual deficits: Secondary | ICD-10-CM | POA: Insufficient documentation

## 2015-04-30 DIAGNOSIS — J449 Chronic obstructive pulmonary disease, unspecified: Secondary | ICD-10-CM | POA: Insufficient documentation

## 2015-04-30 DIAGNOSIS — Z79899 Other long term (current) drug therapy: Secondary | ICD-10-CM | POA: Insufficient documentation

## 2015-04-30 DIAGNOSIS — R509 Fever, unspecified: Secondary | ICD-10-CM | POA: Diagnosis not present

## 2015-04-30 DIAGNOSIS — R739 Hyperglycemia, unspecified: Secondary | ICD-10-CM

## 2015-04-30 DIAGNOSIS — Z8701 Personal history of pneumonia (recurrent): Secondary | ICD-10-CM | POA: Insufficient documentation

## 2015-04-30 DIAGNOSIS — R197 Diarrhea, unspecified: Secondary | ICD-10-CM | POA: Diagnosis not present

## 2015-04-30 DIAGNOSIS — K219 Gastro-esophageal reflux disease without esophagitis: Secondary | ICD-10-CM | POA: Insufficient documentation

## 2015-04-30 DIAGNOSIS — R630 Anorexia: Secondary | ICD-10-CM | POA: Insufficient documentation

## 2015-04-30 DIAGNOSIS — G40909 Epilepsy, unspecified, not intractable, without status epilepticus: Secondary | ICD-10-CM | POA: Diagnosis not present

## 2015-04-30 DIAGNOSIS — Z880 Allergy status to penicillin: Secondary | ICD-10-CM | POA: Diagnosis not present

## 2015-04-30 DIAGNOSIS — K5900 Constipation, unspecified: Secondary | ICD-10-CM | POA: Insufficient documentation

## 2015-04-30 DIAGNOSIS — E1165 Type 2 diabetes mellitus with hyperglycemia: Secondary | ICD-10-CM | POA: Insufficient documentation

## 2015-04-30 DIAGNOSIS — Z794 Long term (current) use of insulin: Secondary | ICD-10-CM | POA: Insufficient documentation

## 2015-04-30 DIAGNOSIS — F419 Anxiety disorder, unspecified: Secondary | ICD-10-CM | POA: Insufficient documentation

## 2015-04-30 DIAGNOSIS — G629 Polyneuropathy, unspecified: Secondary | ICD-10-CM | POA: Diagnosis not present

## 2015-04-30 DIAGNOSIS — J45909 Unspecified asthma, uncomplicated: Secondary | ICD-10-CM | POA: Insufficient documentation

## 2015-04-30 DIAGNOSIS — Z7982 Long term (current) use of aspirin: Secondary | ICD-10-CM | POA: Diagnosis not present

## 2015-04-30 DIAGNOSIS — E785 Hyperlipidemia, unspecified: Secondary | ICD-10-CM | POA: Diagnosis not present

## 2015-04-30 DIAGNOSIS — I10 Essential (primary) hypertension: Secondary | ICD-10-CM | POA: Diagnosis not present

## 2015-04-30 LAB — BASIC METABOLIC PANEL
Anion gap: 14 (ref 5–15)
BUN: 9 mg/dL (ref 6–20)
CO2: 23 mmol/L (ref 22–32)
Calcium: 9.5 mg/dL (ref 8.9–10.3)
Chloride: 93 mmol/L — ABNORMAL LOW (ref 101–111)
Creatinine, Ser: 0.87 mg/dL (ref 0.44–1.00)
GFR calc Af Amer: >60 mL/min (ref >60)
GFR calc non Af Amer: >60 mL/min (ref >60)
Glucose, Bld: 402 mg/dL — ABNORMAL HIGH (ref 65–99)
Potassium: 3.5 mmol/L (ref 3.5–5.1)
Sodium: 130 mmol/L — ABNORMAL LOW (ref 135–145)

## 2015-04-30 LAB — CBG MONITORING, ED
Glucose-Capillary: 298 mg/dL — ABNORMAL HIGH (ref 65–99)
Glucose-Capillary: 335 mg/dL — ABNORMAL HIGH (ref 65–99)
Glucose-Capillary: 363 mg/dL — ABNORMAL HIGH (ref 65–99)

## 2015-04-30 LAB — CBC
HCT: 37.9 % (ref 36.0–46.0)
Hemoglobin: 12.4 g/dL (ref 12.0–15.0)
MCH: 29 pg (ref 26.0–34.0)
MCHC: 32.7 g/dL (ref 30.0–36.0)
MCV: 88.8 fL (ref 78.0–100.0)
Platelets: 299 10*3/uL (ref 150–400)
RBC: 4.27 MIL/uL (ref 3.87–5.11)
RDW: 15.3 % (ref 11.5–15.5)
WBC: 16.3 10*3/uL — ABNORMAL HIGH (ref 4.0–10.5)

## 2015-04-30 MED ORDER — INSULIN ASPART 100 UNIT/ML ~~LOC~~ SOLN
10.0000 [IU] | Freq: Once | SUBCUTANEOUS | Status: AC
  Administered 2015-04-30: 5 [IU] via INTRAVENOUS
  Filled 2015-04-30: qty 1

## 2015-04-30 MED ORDER — SODIUM CHLORIDE 0.9 % IV BOLUS (SEPSIS)
1000.0000 mL | Freq: Once | INTRAVENOUS | Status: AC
  Administered 2015-04-30: 1000 mL via INTRAVENOUS

## 2015-04-30 MED ORDER — ONDANSETRON HCL 4 MG/2ML IJ SOLN: 4.0000 mg | Freq: Once | INTRAMUSCULAR | Status: DC

## 2015-04-30 NOTE — Telephone Encounter (Signed)
Pt asking for advice, has been vomiting and her blood sugar is running high (500s)

## 2015-04-30 NOTE — ED Notes (Signed)
Patient here for n/v/d and hyperglycemia, onset yesterday

## 2015-04-30 NOTE — ED Notes (Signed)
Pt received 4 mg of zofran in route.

## 2015-04-30 NOTE — ED Notes (Signed)
CBG 298. Nurse notified.

## 2015-04-30 NOTE — ED Provider Notes (Signed)
CSN: [id number]     Arrival date & time 04/30/15  1836 History   First MD Initiated Contact with Patient 04/30/15 1903     Chief Complaint  Patient presents with  . Hyperglycemia     (Consider location/radiation/quality/duration/timing/severity/associated sxs/prior Treatment) Patient is a 44 y.o. female presenting with hyperglycemia and diarrhea.  Hyperglycemia Associated symptoms: fatigue, fever (subjective) and vomiting   Associated symptoms: no abdominal pain, no chest pain and no shortness of breath   Diarrhea Diarrhea characteristics: green. Severity:  Severe Onset quality:  Gradual Number of episodes:  Once an hour Duration: started 3AM lasst night. Timing:  Constant Progression:  Unchanged Relieved by:  Nothing Ineffective treatments: immodium. Associated symptoms: chills, fever (subjective) and vomiting   Associated symptoms: no abdominal pain, no recent cough and no headaches   Risk factors: recent antibiotic use (recently in hospital for hernia repair)   Risk factors: no sick contacts and no suspicious food intake     Past Medical History  Diagnosis Date  . Hyperlipidemia     takes Welchol daily  . Inguinal hernia, left 01/2015  . COPD (chronic obstructive pulmonary disease) (HCC)     Albuterol daily as needed  . GERD (gastroesophageal reflux disease)     takes Pepcid daily  . Muscle spasm     takes Flexeril daily as needed  . Hypertension     takes Lisinopril daily  . Peripheral neuropathy (HCC)     takes Lyrica daily  . Constipation     takes Miralax daily as needed  . Asthma     Advair and Spirva daily  . Family history of adverse reaction to anesthesia     mom gets nauseated  . History of blood clots     left leg 3-55yrs ago  . Shortness of breath dyspnea     with exertion  . Pneumonia 2005    hx of  . History of bronchitis 2015  . Smokers' cough ([place])   . Seizures ([place])     takes Lamictal daily;last seizure over a yr ago  . Stroke [facility]) 1989     left sided weakness  . Back pain     reason unknown  . Urinary frequency   . Nocturia   . Anxiety     takes Prozac daily  . Diabetes mellitus     takes Invokana,Victoza,and Hum N daily;fasting blood sugar runs 200-300   Past Surgical History  Procedure Laterality Date  . Laparoscopy      Endometriosis  . Tee without cardioversion N/A 11/28/2013    Procedure: TRANSESOPHAGEAL ECHOCARDIOGRAM (TEE);  Surgeon: Thayer Headings, MD;  Location: [place];  Service: Cardiovascular;  Laterality: N/A;  . Left heart catheterization with coronary angiogram N/A 12/05/2013    Procedure: LEFT HEART CATHETERIZATION WITH CORONARY ANGIOGRAM;  Surgeon: Jettie Booze, MD;  Location: [facility] CATH LAB;  Service: Cardiovascular;  Laterality: N/A;  . Right kidney drained    . Inguinal hernia repair Left 04/08/2015    Procedure: OPEN LEFT INGUINAL HERNIA REPAIR WITH MESH;  Surgeon: Ralene Ok, MD;  Location: [place];  Service: General;  Laterality: Left;  . Insertion of mesh Left 04/08/2015    Procedure: INSERTION OF MESH;  Surgeon: Ralene Ok, MD;  Location: [place];  Service: General;  Laterality: Left;   Family History  Problem Relation Age of Onset  . Venous thrombosis Brother   . Other Brother     BRAIN TUMOR  . Asthma Father   .  Diabetes Father   . Coronary artery disease Mother   . Hypertension Mother   . Diabetes Mother   . Asthma Sister   . Diabetes type II Brother    Social History  Substance Use Topics  . Smoking status: Current Every Day Smoker -- 1.00 packs/day for 28 years    Types: Cigarettes  . Smokeless tobacco: Never Used  . Alcohol Use: No   OB History    No data available     Review of Systems  Constitutional: Positive for fever (subjective), chills, appetite change and fatigue.  HENT: Negative for sore throat.   Eyes: Negative for visual disturbance.  Respiratory: Negative for cough and shortness of breath.   Cardiovascular: Negative for chest pain.   Gastrointestinal: Positive for vomiting and diarrhea. Negative for abdominal pain.  Genitourinary: Negative for difficulty urinating.  Musculoskeletal: Negative for back pain and neck pain.  Skin: Negative for rash.  Neurological: Negative for syncope and headaches.      Allergies  Metoprolol; Montelukast sodium; Morphine sulfate; Penicillins; and Gabapentin  Home Medications   Prior to Admission medications   Medication Sig Start Date End Date Taking? Authorizing Provider  albuterol (PROVENTIL HFA;VENTOLIN HFA) 108 (90 BASE) MCG/ACT inhaler Inhale 2 puffs into the lungs every 4 (four) hours as needed. For shortness of breath 02/04/15  Yes Ashly M Gottschalk, DO  aspirin EC 81 MG tablet Take 81 mg by mouth every morning.   Yes Historical Provider, MD  atorvastatin (LIPITOR) 80 MG tablet Take 1 tablet (80 mg total) by mouth every evening. 02/04/15  Yes Ashly M Gottschalk, DO  clonazePAM (KLONOPIN) 1 MG tablet Take 1 mg by mouth 3 (three) times daily.  04/16/13  Yes Gerda Diss, MD  colesevelam [facility]) 625 MG tablet Take 1,250 mg by mouth 2 (two) times daily with a meal.   Yes Historical Provider, MD  cyclobenzaprine (FLEXERIL) 10 MG tablet Take 1 tablet (10 mg total) by mouth 3 (three) times daily as needed for muscle spasms. 08/22/14  Yes Coral Spikes, DO  famotidine (PEPCID) 20 MG tablet Take 20 mg by mouth 2 (two) times daily.   Yes Historical Provider, MD  FLUoxetine (PROZAC) 40 MG capsule Take 40 mg by mouth daily.    Yes Historical Provider, MD  Fluticasone-Salmeterol (ADVAIR DISKUS) 500-50 MCG/DOSE AEPB Inhale 1 puff into the lungs daily. 04/15/14  Yes Zenia Resides, MD  furosemide (LASIX) 40 MG tablet Take 1 tablet (40 mg total) by mouth daily. 02/16/15  Yes Jettie Booze, MD  ibuprofen (ADVIL,MOTRIN) 200 MG tablet Take 600 mg by mouth 2 (two) times daily as needed.   Yes Historical Provider, MD  insulin regular human CONCENTRATED (HUMULIN R) 500 UNIT/ML SOLN injection  Inject 100-180 Units into the skin 2 (two) times daily with a meal. 100 units = 200, 180 units = 300 and above   Yes Historical Provider, MD  lamoTRIgine (LAMICTAL) 200 MG tablet Take 200 mg by mouth at bedtime.   Yes Historical Provider, MD  lisinopril (PRINIVIL,ZESTRIL) 10 MG tablet Take 1 tablet (10 mg total) by mouth at bedtime. 02/27/15  Yes Vivi Barrack, MD  loratadine (CLARITIN) 10 MG tablet Take 1 tablet (10 mg total) by mouth daily. 01/21/14  Yes Ashly Windell Moulding, DO  OXYGEN Inhale 2 L into the lungs at bedtime.   Yes Historical Provider, MD  pregabalin (LYRICA) 150 MG capsule Take 1 capsule (150 mg total) by mouth 2 (two) times daily. 04/27/15  Yes Janora Norlander, DO  QUEtiapine (SEROQUEL XR) 400 MG 24 hr tablet Take 800 mg by mouth at bedtime.   Yes Historical Provider, MD  Tiotropium Bromide Monohydrate (SPIRIVA RESPIMAT) 2.5 MCG/ACT AERS Inhale 2 puffs into the lungs daily. 04/01/14  Yes Zenia Resides, MD  HYDROcodone-acetaminophen (NORCO) 5-325 MG per tablet Take 1 tablet by mouth 3 (three) times daily. Patient not taking: Reported on 04/30/2015 02/04/15   Janora Norlander, DO  metroNIDAZOLE (FLAGYL) 500 MG tablet Take 1 tablet (500 mg total) by mouth 3 (three) times daily. 05/01/15 05/15/15  Gareth Morgan, MD  ondansetron (ZOFRAN ODT) 4 MG disintegrating tablet Take 1 tablet (4 mg total) by mouth every 8 (eight) hours as needed for nausea or vomiting. 05/01/15   Gareth Morgan, MD  oxyCODONE-acetaminophen (PERCOCET) 7.5-325 MG tablet Take 1 tablet by mouth every 4 (four) hours as needed for severe pain. Patient not taking: Reported on 04/30/2015 04/08/15   Ralene Ok, MD   BP 129/66 mmHg  Pulse 97  Temp(Src) 97.8 F (36.6 C) (Oral)  Resp 22  Ht 5\' 4"  (1.626 m)  Wt 250 lb (113.399 kg)  BMI 42.89 kg/m2  SpO2 94%  LMP 11/11/2011 Physical Exam  Constitutional: She is oriented to person, place, and time. She appears well-developed and well-nourished. No distress.   HENT:  Head: Normocephalic and atraumatic.  Eyes: Conjunctivae and EOM are normal.  Neck: Normal range of motion.  Cardiovascular: Normal rate, regular rhythm, normal heart sounds and intact distal pulses.  Exam reveals no gallop and no friction rub.   No murmur heard. Pulmonary/Chest: Effort normal and breath sounds normal. No respiratory distress. She has no wheezes. She has no rales.  Abdominal: Soft. She exhibits no distension. There is no tenderness. There is no guarding.  Musculoskeletal: She exhibits no edema or tenderness.  Neurological: She is alert and oriented to person, place, and time.  Skin: Skin is warm and dry. No rash noted. She is not diaphoretic. No erythema.  Incision from hernia repair, superficially open (family reports it has appeared this way for some time, including at Surgery appt last week) Mild surrounding erythema/irritation, no significant surrounding erythema, no drainage, no areas of fluctuance  Nursing note and vitals reviewed.   ED Course  Procedures (including critical care time) Labs Review Labs Reviewed  BASIC METABOLIC PANEL - Abnormal; Notable for the following:    Sodium 130 (*)    Chloride 93 (*)    Glucose, Bld 402 (*)    All other components within normal limits  CBC - Abnormal; Notable for the following:    WBC 16.3 (*)    All other components within normal limits  CBG MONITORING, ED - Abnormal; Notable for the following:    Glucose-Capillary 363 (*)    All other components within normal limits  CBG MONITORING, ED - Abnormal; Notable for the following:    Glucose-Capillary 335 (*)    All other components within normal limits  CBG MONITORING, ED - Abnormal; Notable for the following:    Glucose-Capillary 298 (*)    All other components within normal limits  C DIFFICILE QUICK SCREEN W PCR REFLEX  URINALYSIS, ROUTINE W REFLEX MICROSCOPIC (NOT AT [facility])    Imaging Review No results found. I have personally reviewed and evaluated  these images and lab results as part of my medical decision-making.   EKG Interpretation None      MDM   Final diagnoses:  Hyperglycemia  Diarrhea of presumed infectious  origin   44yo female with hx of DM, TIA, hyperlipidemia, COPD, recent hernia repair, presents with concern of nausea/vomiting and diarrhea and hyperglycemia.  No signs of DKA. Normal abdominal exam, no sign of current postop infxn or cellulitis. Pt describes sig diarrhea, and given recent hospitalization and abx, will treat empirically for c diff with flagyl. Stool sample sent.  Electrolytes, VS within normal limits and appropriate for zofran and outpt treatment. Discussed in detail reasons to return.  Patient discharged in stable condition with understanding of reasons to return.    Gareth Morgan, MD 05/01/15 [phone]

## 2015-04-30 NOTE — ED Notes (Signed)
Pt to bathroom without any problems.  Pt requesting something for nausea.

## 2015-04-30 NOTE — ED Notes (Signed)
CBG 363. Nurse notified.

## 2015-04-30 NOTE — Telephone Encounter (Signed)
Return call to patient regarding blood sugars. Patient stated her blood sugar has went down to 480s, but she was still having symptoms (vomiting, diarrhea, dizziness).  Advised patient that she should go to ED for evaluation. Will forward to PCP. Derl Barrow, RN

## 2015-05-01 LAB — C DIFFICILE QUICK SCREEN W PCR REFLEX
C Diff antigen: NEGATIVE
C Diff interpretation: NEGATIVE
C Diff toxin: NEGATIVE

## 2015-05-01 MED ORDER — METRONIDAZOLE 500 MG PO TABS
500.0000 mg | ORAL_TABLET | Freq: Three times a day (TID) | ORAL | Status: AC
Start: 2015-05-01 — End: 2015-05-15

## 2015-05-01 MED ORDER — ONDANSETRON 4 MG PO TBDP: 4.0000 mg | ORAL_TABLET | Freq: Three times a day (TID) | ORAL | Status: DC | PRN

## 2015-05-01 NOTE — ED Notes (Signed)
Pt placed on bedside commode.  

## 2015-05-01 NOTE — Telephone Encounter (Signed)
Agree with plan.  Appreciate assistance with the care of this patient.

## 2015-05-06 ENCOUNTER — Other Ambulatory Visit: Payer: Self-pay | Admitting: *Deleted

## 2015-05-07 MED ORDER — TIOTROPIUM BROMIDE MONOHYDRATE 2.5 MCG/ACT IN AERS: 2.0000 | INHALATION_SPRAY | Freq: Every day | RESPIRATORY_TRACT | Status: DC

## 2015-05-22 IMAGING — CR DG CHEST 2V
2 series · 2 of 2 positions shown · non-contrast
Comparison: November 04, 2013

CLINICAL DATA: Two week history of pleuritic pain with difficulty
breathing. Right-sided chest pain

EXAM:
CHEST  2 VIEW

[w chest pa]
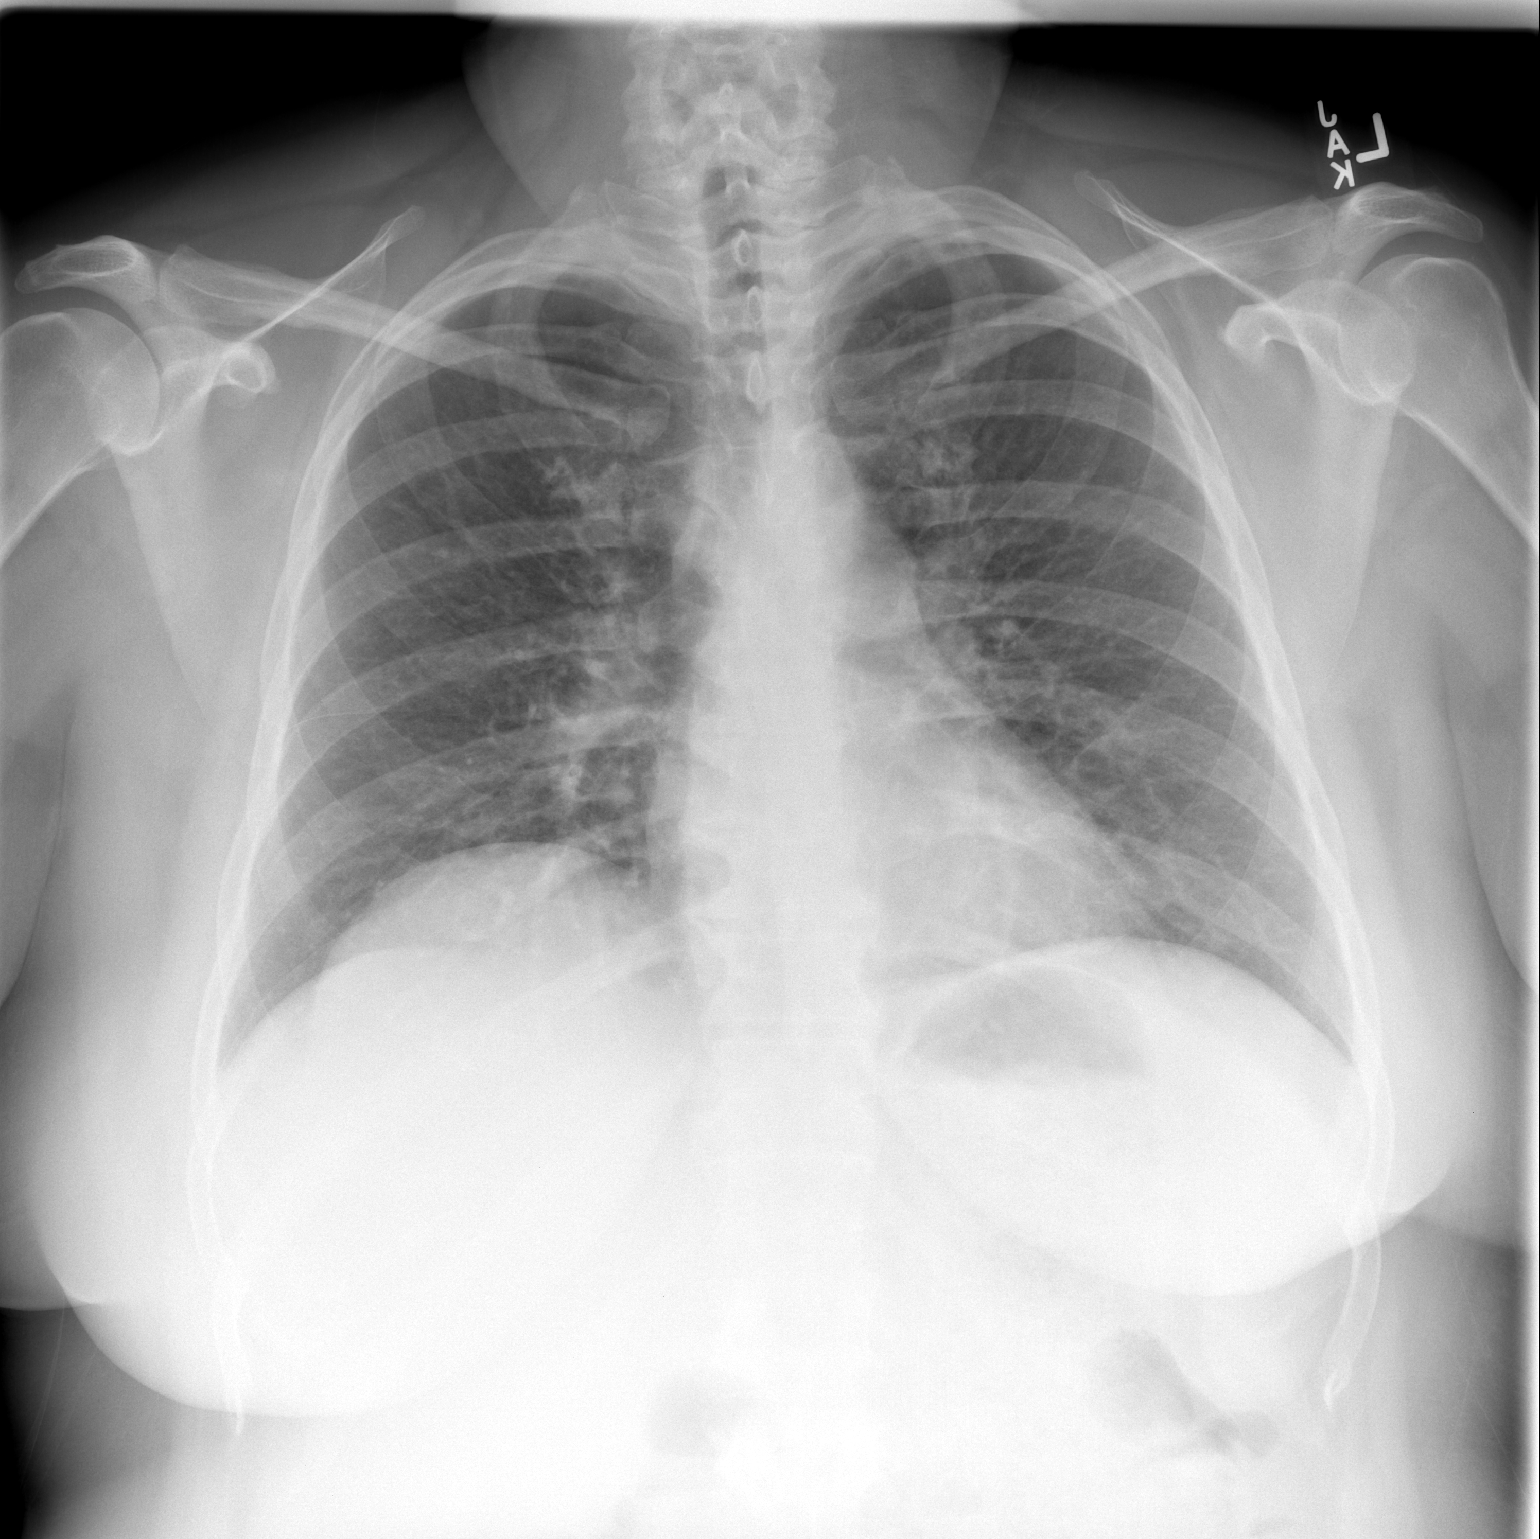

[w chest lat]
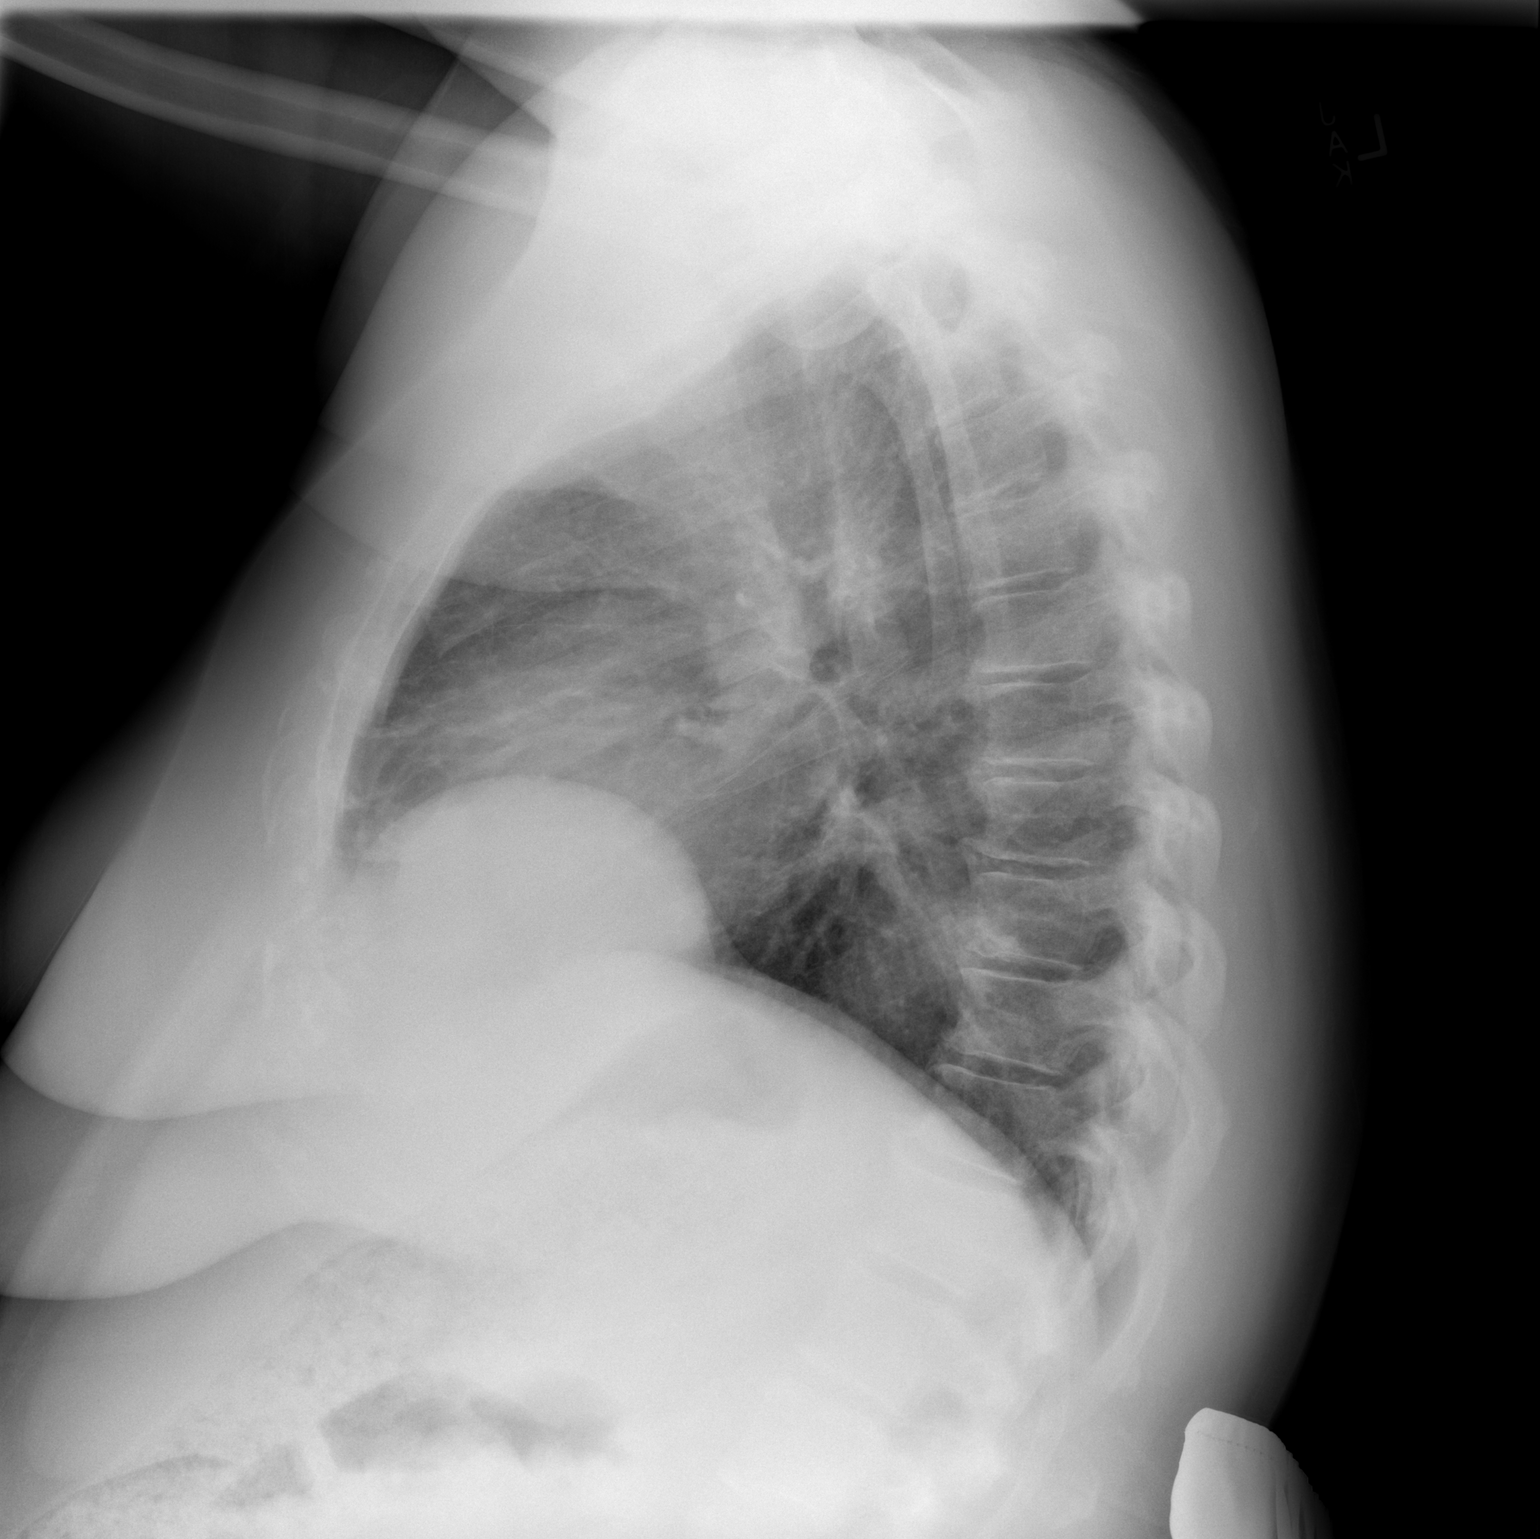

[2 of 2 positions shown; findings below may reference images not displayed]

FINDINGS: Lungs are clear. Heart size and pulmonary vascularity are normal. No
adenopathy. No pneumothorax. There is degenerative change in the
thoracic spine.
IMPRESSION: No edema or consolidation.

## 2015-06-01 ENCOUNTER — Other Ambulatory Visit: Payer: Self-pay | Admitting: Family Medicine

## 2015-07-01 DIAGNOSIS — Z79899 Other long term (current) drug therapy: Secondary | ICD-10-CM | POA: Diagnosis not present

## 2015-07-01 DIAGNOSIS — G894 Chronic pain syndrome: Secondary | ICD-10-CM | POA: Diagnosis not present

## 2015-07-21 ENCOUNTER — Telehealth: Payer: Self-pay | Admitting: *Deleted

## 2015-07-21 NOTE — Telephone Encounter (Signed)
Called patient to offer flu vaccine, however, VM picked up.  Left message on patient's voice mail to return call. Rowdy Guerrini L, RN   

## 2015-07-29 DIAGNOSIS — M79606 Pain in leg, unspecified: Secondary | ICD-10-CM | POA: Diagnosis not present

## 2015-07-29 DIAGNOSIS — G894 Chronic pain syndrome: Secondary | ICD-10-CM | POA: Diagnosis not present

## 2015-07-29 DIAGNOSIS — Z79899 Other long term (current) drug therapy: Secondary | ICD-10-CM | POA: Diagnosis not present

## 2015-07-29 DIAGNOSIS — M79673 Pain in unspecified foot: Secondary | ICD-10-CM | POA: Diagnosis not present

## 2015-07-30 ENCOUNTER — Other Ambulatory Visit: Payer: Self-pay | Admitting: Family Medicine

## 2015-08-07 DIAGNOSIS — F3131 Bipolar disorder, current episode depressed, mild: Secondary | ICD-10-CM | POA: Diagnosis not present

## 2015-08-26 DIAGNOSIS — M4697 Unspecified inflammatory spondylopathy, lumbosacral region: Secondary | ICD-10-CM | POA: Diagnosis not present

## 2015-08-26 DIAGNOSIS — Z79899 Other long term (current) drug therapy: Secondary | ICD-10-CM | POA: Diagnosis not present

## 2015-08-26 DIAGNOSIS — G894 Chronic pain syndrome: Secondary | ICD-10-CM | POA: Diagnosis not present

## 2015-08-26 DIAGNOSIS — M79606 Pain in leg, unspecified: Secondary | ICD-10-CM | POA: Diagnosis not present

## 2015-08-27 NOTE — Telephone Encounter (Signed)
Called patient to offer flu vaccine, however, patient's husband picked up.  Relaying info from patient, husband states she received flu vaccine at a doctor's office in November 2016. Velora Heckler, RN

## 2015-08-28 ENCOUNTER — Telehealth: Payer: Self-pay | Admitting: Family Medicine

## 2015-08-28 NOTE — Telephone Encounter (Signed)
Would like a face mask for her oxygen rather than the nose piece [employer]  [place] [phone]

## 2015-08-31 NOTE — Telephone Encounter (Signed)
Can you please call the number provided and give a verbal order for this? I will not be back in the office until Wednesday. Thank you.

## 2015-08-31 NOTE — Telephone Encounter (Signed)
Contacted the number below to give the verbal order as Dr. Lajuana Ripple requested and the lady at [facility] stated that they need a written Rx for this. Forwarding to PCP. Katharina Caper, Sherine Cortese D, [place]

## 2015-09-02 ENCOUNTER — Other Ambulatory Visit: Payer: Self-pay | Admitting: Family Medicine

## 2015-09-02 DIAGNOSIS — J449 Chronic obstructive pulmonary disease, unspecified: Secondary | ICD-10-CM

## 2015-09-02 MED ORDER — PARI SOFT PLASTIC ADULT MASK MISC: Status: DC

## 2015-09-02 NOTE — Telephone Encounter (Signed)
This is done.  RX printed to be faxed.

## 2015-09-03 ENCOUNTER — Telehealth: Payer: Self-pay | Admitting: Family Medicine

## 2015-09-03 DIAGNOSIS — J449 Chronic obstructive pulmonary disease, unspecified: Secondary | ICD-10-CM

## 2015-09-03 NOTE — Telephone Encounter (Signed)
Need an order faxed to Arbie Cookey from [place] that patient's oxygen mask is to be titrated by an respiratory therapist.  Can fax to [phone].  This due to patient only using 2 ltrs of oxygen.

## 2015-09-04 NOTE — Telephone Encounter (Signed)
Will you ask one of the attendings to do this? I will not be back in office until Monday.

## 2015-09-08 ENCOUNTER — Other Ambulatory Visit: Payer: Self-pay | Admitting: Family Medicine

## 2015-09-08 DIAGNOSIS — J449 Chronic obstructive pulmonary disease, unspecified: Secondary | ICD-10-CM

## 2015-09-08 MED ORDER — PARI SOFT PLASTIC ADULT MASK MISC: Status: DC

## 2015-09-14 NOTE — Telephone Encounter (Signed)
Do you know if this has been taken care of? Just now seeing this message. Ottis Stain, CMA

## 2015-09-14 NOTE — Addendum Note (Signed)
Addended byMingo Amber, Kayleen Memos on: 09/14/2015 03:51 PM   Modules accepted: Orders

## 2015-09-14 NOTE — Telephone Encounter (Signed)
Order completed and faxed. Katharina Caper, Kebron Pulse D, [place]

## 2015-09-14 NOTE — Telephone Encounter (Signed)
Completed and printed order

## 2015-09-23 DIAGNOSIS — M79673 Pain in unspecified foot: Secondary | ICD-10-CM | POA: Diagnosis not present

## 2015-09-23 DIAGNOSIS — G894 Chronic pain syndrome: Secondary | ICD-10-CM | POA: Diagnosis not present

## 2015-09-23 DIAGNOSIS — Z79899 Other long term (current) drug therapy: Secondary | ICD-10-CM | POA: Diagnosis not present

## 2015-09-23 DIAGNOSIS — M4697 Unspecified inflammatory spondylopathy, lumbosacral region: Secondary | ICD-10-CM | POA: Diagnosis not present

## 2015-09-23 DIAGNOSIS — F4542 Pain disorder with related psychological factors: Secondary | ICD-10-CM | POA: Diagnosis not present

## 2015-09-23 IMAGING — CR DG CHEST 2V
2 series · 2 of 2 positions shown · non-contrast
Comparison: Prior chest x-ray 08/22/2014

CLINICAL DATA: 43-year-old female with 1 week history of cough and
right upper chest pain

EXAM:
CHEST  2 VIEW

[chest pa]
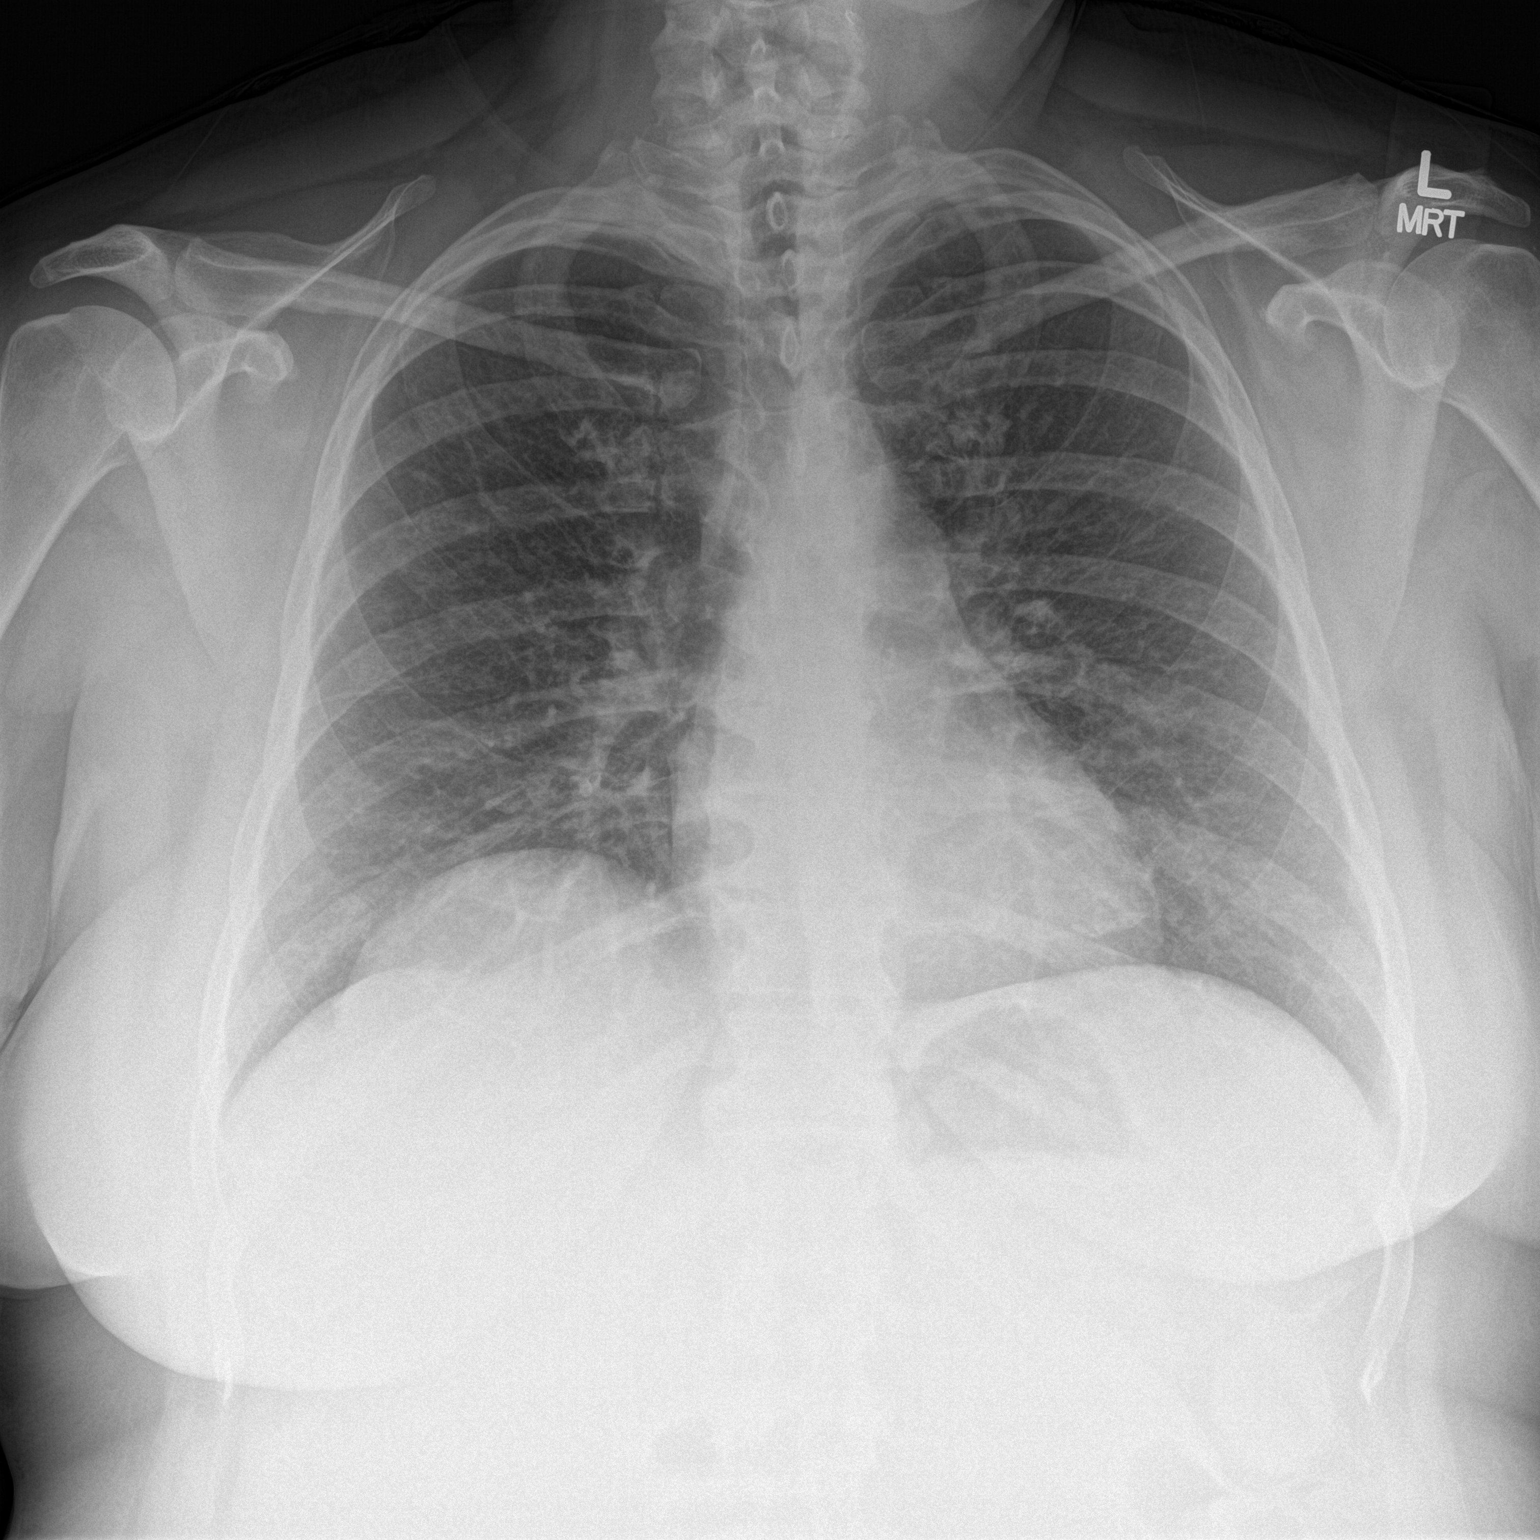

[chest lat]
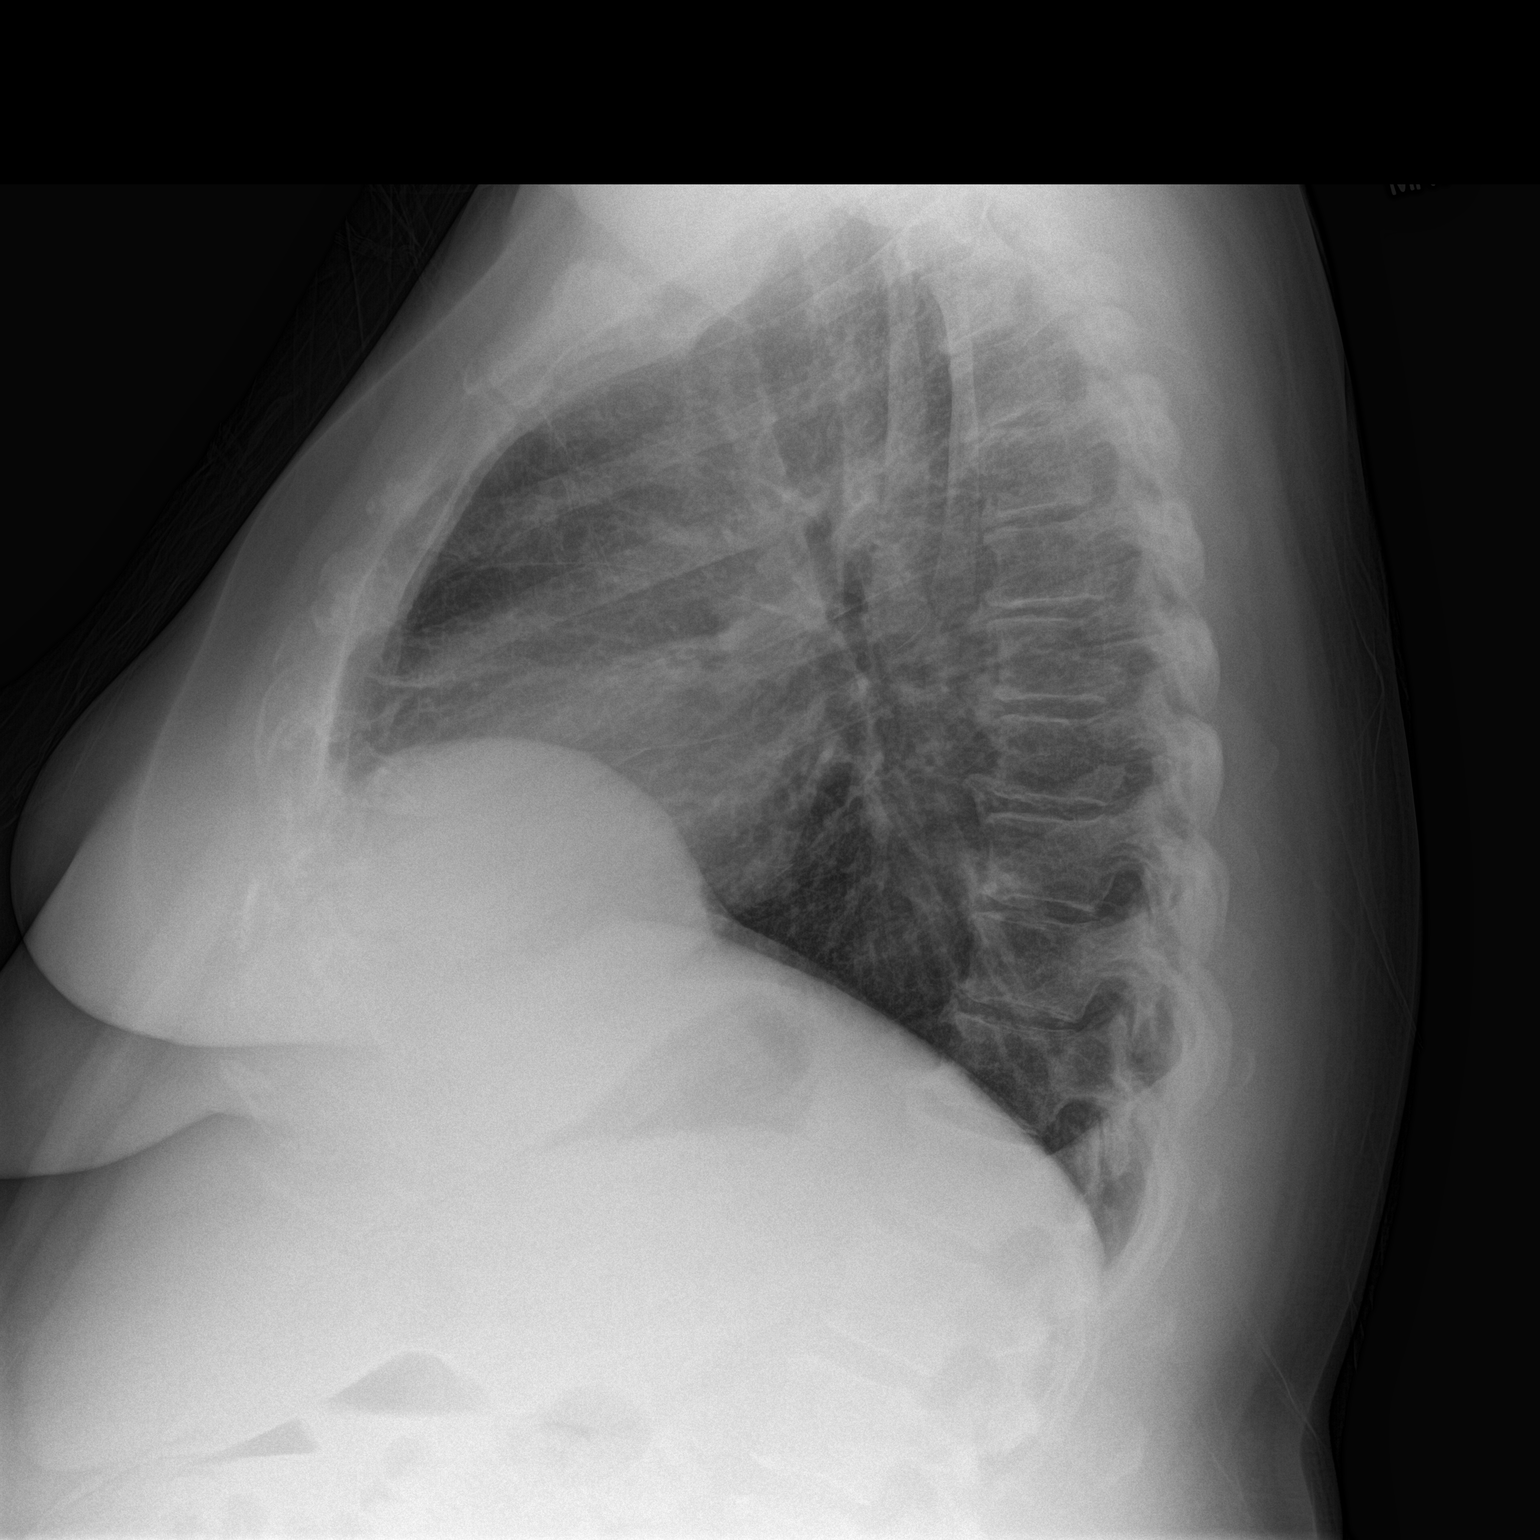

[2 of 2 positions shown; findings below may reference images not displayed]

FINDINGS: The lungs are clear and negative for focal airspace consolidation,
pulmonary edema or suspicious pulmonary nodule. No pleural effusion
or pneumothorax. Cardiac and mediastinal contours are within normal
limits. No acute fracture or lytic or blastic osseous lesions. Mild
multifocal degenerative disc disease throughout the thoracic spine.
The visualized upper abdominal bowel gas pattern is unremarkable.
IMPRESSION: No active cardiopulmonary disease.

## 2015-10-14 DIAGNOSIS — Z79891 Long term (current) use of opiate analgesic: Secondary | ICD-10-CM | POA: Diagnosis not present

## 2015-10-14 DIAGNOSIS — G894 Chronic pain syndrome: Secondary | ICD-10-CM | POA: Diagnosis not present

## 2015-10-14 DIAGNOSIS — Z79899 Other long term (current) drug therapy: Secondary | ICD-10-CM | POA: Diagnosis not present

## 2015-10-14 DIAGNOSIS — M4697 Unspecified inflammatory spondylopathy, lumbosacral region: Secondary | ICD-10-CM | POA: Diagnosis not present

## 2015-10-14 DIAGNOSIS — M79673 Pain in unspecified foot: Secondary | ICD-10-CM | POA: Diagnosis not present

## 2015-10-14 DIAGNOSIS — M79606 Pain in leg, unspecified: Secondary | ICD-10-CM | POA: Diagnosis not present

## 2015-10-15 ENCOUNTER — Ambulatory Visit (INDEPENDENT_AMBULATORY_CARE_PROVIDER_SITE_OTHER): Payer: BLUE CROSS/BLUE SHIELD | Admitting: Family Medicine

## 2015-10-15 ENCOUNTER — Encounter: Payer: Self-pay | Admitting: Family Medicine

## 2015-10-15 VITALS — BP 143/61 | HR 91 | Temp 97.9°F | Ht 64.0 in | Wt 270.2 lb

## 2015-10-15 DIAGNOSIS — S2239XA Fracture of one rib, unspecified side, initial encounter for closed fracture: Secondary | ICD-10-CM

## 2015-10-15 DIAGNOSIS — S2232XA Fracture of one rib, left side, initial encounter for closed fracture: Secondary | ICD-10-CM

## 2015-10-15 DIAGNOSIS — E1142 Type 2 diabetes mellitus with diabetic polyneuropathy: Secondary | ICD-10-CM

## 2015-10-15 DIAGNOSIS — F17200 Nicotine dependence, unspecified, uncomplicated: Secondary | ICD-10-CM | POA: Diagnosis not present

## 2015-10-15 DIAGNOSIS — J441 Chronic obstructive pulmonary disease with (acute) exacerbation: Secondary | ICD-10-CM

## 2015-10-15 HISTORY — DX: Fracture of one rib, unspecified side, initial encounter for closed fracture: S22.39XA

## 2015-10-15 HISTORY — DX: Chronic obstructive pulmonary disease with (acute) exacerbation: J44.1

## 2015-10-15 MED ORDER — DOXYCYCLINE HYCLATE 100 MG PO TABS: 100.0000 mg | ORAL_TABLET | Freq: Two times a day (BID) | ORAL | Status: DC

## 2015-10-15 MED ORDER — PREDNISONE 20 MG PO TABS: 20.0000 mg | ORAL_TABLET | Freq: Three times a day (TID) | ORAL | Status: DC

## 2015-10-15 MED ORDER — HYDROCODONE-ACETAMINOPHEN 5-325 MG PO TABS: 1.0000 | ORAL_TABLET | Freq: Three times a day (TID) | ORAL | Status: DC

## 2015-10-15 NOTE — Patient Instructions (Addendum)
You have a COPD exacerbation.  I have sent in prescriptions for prednisone and an antibiotic. I will give you a short course of narcotic pain medicine for what is almost certainly a stress fracture of you rib.   I want you to make an appointment on the way out with Dr. Valentina Lucks to discuss smoking cessation.   Good luck, I hope this helps. Don't wait two weeks next time - get in earlier.

## 2015-10-15 NOTE — Assessment & Plan Note (Signed)
Stress from coughing.  Short term narcotic

## 2015-10-15 NOTE — Assessment & Plan Note (Signed)
She had never heard this term.  Educated and requested that she seek medical attention earlier and not wait two weeks.

## 2015-10-15 NOTE — Progress Notes (Signed)
   Subjective:    Patient ID: [name], female    DOB: 1970-09-27, 45 y.o.   MRN: [id number]  HPI 45 yo female who appears more like 65.  C/O respiratory infection for two weeks.  Does have known COPD/asthma and unfortunately continues to smoke.  Symptoms are definitely not improving and perhaps they are worsening.  She has cough, rhinorrhea, DOE and wheezing.  She also complains of severe left lateral chest (rib) pain.      Review of Systems     Objective:   Physical Exam  VS noted including pulse ox Marked exp wheeze in all lung fields.  Good air movement.  Point tender in mid axillary line at about T10 rib.   Ext 1+ leg bilateral symetric edema.        Assessment & Plan:

## 2015-10-15 NOTE — Assessment & Plan Note (Signed)
After hearing of the severity of her COPD, she is willing to see Dr. Valentina Lucks to discuss smoking cessation.

## 2015-10-18 DIAGNOSIS — J449 Chronic obstructive pulmonary disease, unspecified: Secondary | ICD-10-CM | POA: Diagnosis not present

## 2015-10-26 ENCOUNTER — Telehealth: Payer: Self-pay | Admitting: Family Medicine

## 2015-10-26 NOTE — Telephone Encounter (Signed)
Need refill on the prednisone and voxycycline sent to pharmacy.

## 2015-10-27 NOTE — Telephone Encounter (Signed)
Patient needs to be seen in office if she is needing antibiotics and steroids.  This is not a chronic medication.  Please advise patient.

## 2015-10-28 ENCOUNTER — Ambulatory Visit: Payer: BLUE CROSS/BLUE SHIELD | Admitting: Family Medicine

## 2015-10-28 NOTE — Telephone Encounter (Signed)
Spoke with pt. Informed her of below. She had an appointment for today but had to cancel because she has two broken ribs and can't ride in a car to get here. She is hoping to make an appointment for next week to come into our office. Ottis Stain, CMA

## 2015-10-30 ENCOUNTER — Other Ambulatory Visit: Payer: Self-pay | Admitting: Family Medicine

## 2015-10-31 NOTE — Telephone Encounter (Signed)
Please advise patient she is due for annual physical exam with labs at the end of July.  I will give enough refills until then.

## 2015-11-03 ENCOUNTER — Inpatient Hospital Stay (HOSPITAL_COMMUNITY): Payer: BLUE CROSS/BLUE SHIELD

## 2015-11-03 ENCOUNTER — Observation Stay (HOSPITAL_COMMUNITY)
Admission: AD | Admit: 2015-11-03 | Discharge: 2015-11-04 | DRG: 204 | Discharge status: Against Medical Advice | Payer: BLUE CROSS/BLUE SHIELD | Source: Ambulatory Visit | Attending: Family Medicine | Admitting: Family Medicine

## 2015-11-03 ENCOUNTER — Ambulatory Visit (INDEPENDENT_AMBULATORY_CARE_PROVIDER_SITE_OTHER): Payer: BLUE CROSS/BLUE SHIELD | Admitting: Family Medicine

## 2015-11-03 ENCOUNTER — Encounter: Payer: Self-pay | Admitting: Family Medicine

## 2015-11-03 ENCOUNTER — Ambulatory Visit (HOSPITAL_COMMUNITY)
Admission: RE | Admit: 2015-11-03 | Discharge: 2015-11-03 | Disposition: A | Discharge status: Home / Self Care | Payer: BLUE CROSS/BLUE SHIELD | Source: Ambulatory Visit | Attending: Family Medicine | Admitting: Family Medicine

## 2015-11-03 ENCOUNTER — Encounter (HOSPITAL_COMMUNITY): Payer: Self-pay | Admitting: General Practice

## 2015-11-03 VITALS — BP 141/63 | HR 106 | Temp 98.5°F | Ht 66.0 in | Wt 267.4 lb

## 2015-11-03 DIAGNOSIS — Z79899 Other long term (current) drug therapy: Secondary | ICD-10-CM | POA: Diagnosis not present

## 2015-11-03 DIAGNOSIS — J449 Chronic obstructive pulmonary disease, unspecified: Secondary | ICD-10-CM | POA: Diagnosis present

## 2015-11-03 DIAGNOSIS — R0602 Shortness of breath: Secondary | ICD-10-CM | POA: Insufficient documentation

## 2015-11-03 DIAGNOSIS — R600 Localized edema: Secondary | ICD-10-CM

## 2015-11-03 DIAGNOSIS — Z7952 Long term (current) use of systemic steroids: Secondary | ICD-10-CM

## 2015-11-03 DIAGNOSIS — R000 Tachycardia, unspecified: Secondary | ICD-10-CM

## 2015-11-03 DIAGNOSIS — Z6841 Body Mass Index (BMI) 40.0 and over, adult: Secondary | ICD-10-CM

## 2015-11-03 DIAGNOSIS — F419 Anxiety disorder, unspecified: Secondary | ICD-10-CM | POA: Diagnosis not present

## 2015-11-03 DIAGNOSIS — E1165 Type 2 diabetes mellitus with hyperglycemia: Secondary | ICD-10-CM | POA: Diagnosis not present

## 2015-11-03 DIAGNOSIS — Z8673 Personal history of transient ischemic attack (TIA), and cerebral infarction without residual deficits: Secondary | ICD-10-CM

## 2015-11-03 DIAGNOSIS — I5032 Chronic diastolic (congestive) heart failure: Secondary | ICD-10-CM

## 2015-11-03 DIAGNOSIS — J9811 Atelectasis: Secondary | ICD-10-CM | POA: Diagnosis not present

## 2015-11-03 DIAGNOSIS — E669 Obesity, unspecified: Secondary | ICD-10-CM | POA: Diagnosis present

## 2015-11-03 DIAGNOSIS — K219 Gastro-esophageal reflux disease without esophagitis: Secondary | ICD-10-CM | POA: Diagnosis present

## 2015-11-03 DIAGNOSIS — F17210 Nicotine dependence, cigarettes, uncomplicated: Secondary | ICD-10-CM | POA: Diagnosis present

## 2015-11-03 DIAGNOSIS — E118 Type 2 diabetes mellitus with unspecified complications: Secondary | ICD-10-CM

## 2015-11-03 DIAGNOSIS — I10 Essential (primary) hypertension: Secondary | ICD-10-CM | POA: Diagnosis present

## 2015-11-03 DIAGNOSIS — E1142 Type 2 diabetes mellitus with diabetic polyneuropathy: Secondary | ICD-10-CM | POA: Diagnosis not present

## 2015-11-03 DIAGNOSIS — R39198 Other difficulties with micturition: Secondary | ICD-10-CM

## 2015-11-03 DIAGNOSIS — I4510 Unspecified right bundle-branch block: Secondary | ICD-10-CM | POA: Insufficient documentation

## 2015-11-03 DIAGNOSIS — R0601 Orthopnea: Secondary | ICD-10-CM | POA: Diagnosis not present

## 2015-11-03 DIAGNOSIS — E119 Type 2 diabetes mellitus without complications: Secondary | ICD-10-CM | POA: Insufficient documentation

## 2015-11-03 DIAGNOSIS — R3915 Urgency of urination: Secondary | ICD-10-CM | POA: Diagnosis not present

## 2015-11-03 DIAGNOSIS — IMO0002 Reserved for concepts with insufficient information to code with codable children: Secondary | ICD-10-CM

## 2015-11-03 DIAGNOSIS — F411 Generalized anxiety disorder: Secondary | ICD-10-CM | POA: Diagnosis present

## 2015-11-03 DIAGNOSIS — R0600 Dyspnea, unspecified: Secondary | ICD-10-CM | POA: Insufficient documentation

## 2015-11-03 DIAGNOSIS — Z794 Long term (current) use of insulin: Secondary | ICD-10-CM | POA: Diagnosis not present

## 2015-11-03 DIAGNOSIS — Z7982 Long term (current) use of aspirin: Secondary | ICD-10-CM | POA: Diagnosis not present

## 2015-11-03 DIAGNOSIS — R9431 Abnormal electrocardiogram [ECG] [EKG]: Secondary | ICD-10-CM | POA: Insufficient documentation

## 2015-11-03 DIAGNOSIS — E785 Hyperlipidemia, unspecified: Secondary | ICD-10-CM | POA: Diagnosis not present

## 2015-11-03 DIAGNOSIS — G40909 Epilepsy, unspecified, not intractable, without status epilepticus: Secondary | ICD-10-CM | POA: Diagnosis not present

## 2015-11-03 DIAGNOSIS — F319 Bipolar disorder, unspecified: Secondary | ICD-10-CM | POA: Diagnosis present

## 2015-11-03 DIAGNOSIS — I5033 Acute on chronic diastolic (congestive) heart failure: Secondary | ICD-10-CM

## 2015-11-03 LAB — CBC WITH DIFFERENTIAL/PLATELET
Basophils Absolute: 0.1 10*3/uL (ref 0.0–0.1)
Basophils Relative: 0 %
Eosinophils Absolute: 0.3 10*3/uL (ref 0.0–0.7)
Eosinophils Relative: 3 %
HCT: 41.8 % (ref 36.0–46.0)
Hemoglobin: 13.9 g/dL (ref 12.0–15.0)
Lymphocytes Relative: 23 %
Lymphs Abs: 2.6 10*3/uL (ref 0.7–4.0)
MCH: 29.6 pg (ref 26.0–34.0)
MCHC: 33.3 g/dL (ref 30.0–36.0)
MCV: 89.1 fL (ref 78.0–100.0)
Monocytes Absolute: 0.8 10*3/uL (ref 0.1–1.0)
Monocytes Relative: 7 %
Neutro Abs: 7.6 10*3/uL (ref 1.7–7.7)
Neutrophils Relative %: 67 %
Platelets: 233 10*3/uL (ref 150–400)
RBC: 4.69 MIL/uL (ref 3.87–5.11)
RDW: 15.5 % (ref 11.5–15.5)
WBC: 11.3 10*3/uL — ABNORMAL HIGH (ref 4.0–10.5)

## 2015-11-03 LAB — POCT URINALYSIS DIPSTICK
Bilirubin, UA: NEGATIVE
Glucose, UA: 500
Ketones, UA: NEGATIVE
Leukocytes, UA: NEGATIVE
Nitrite, UA: NEGATIVE
Protein, UA: NEGATIVE
Spec Grav, UA: <=1.005
Urobilinogen, UA: 0.2
pH, UA: 5.5

## 2015-11-03 LAB — POCT UA - MICROSCOPIC ONLY

## 2015-11-03 LAB — COMPREHENSIVE METABOLIC PANEL
ALT: 86 U/L — ABNORMAL HIGH (ref 14–54)
AST: 72 U/L — ABNORMAL HIGH (ref 15–41)
Albumin: 3.6 g/dL (ref 3.5–5.0)
Alkaline Phosphatase: 123 U/L (ref 38–126)
Anion gap: 15 (ref 5–15)
BUN: 14 mg/dL (ref 6–20)
CO2: 27 mmol/L (ref 22–32)
Calcium: 9.5 mg/dL (ref 8.9–10.3)
Chloride: 90 mmol/L — ABNORMAL LOW (ref 101–111)
Creatinine, Ser: 1 mg/dL (ref 0.44–1.00)
GFR calc Af Amer: >60 mL/min (ref >60)
GFR calc non Af Amer: >60 mL/min (ref >60)
Glucose, Bld: 545 mg/dL — ABNORMAL HIGH (ref 65–99)
Potassium: 4.1 mmol/L (ref 3.5–5.1)
Sodium: 132 mmol/L — ABNORMAL LOW (ref 135–145)
Total Bilirubin: 0.9 mg/dL (ref 0.3–1.2)
Total Protein: 7.5 g/dL (ref 6.5–8.1)

## 2015-11-03 LAB — GLUCOSE, CAPILLARY
Glucose-Capillary: 572 mg/dL (ref 65–99)
Glucose-Capillary: 566 mg/dL (ref 65–99)
Glucose-Capillary: 549 mg/dL — ABNORMAL HIGH (ref 65–99)
Glucose-Capillary: 575 mg/dL (ref 65–99)

## 2015-11-03 LAB — BRAIN NATRIURETIC PEPTIDE: B Natriuretic Peptide: 20.2 pg/mL (ref 0.0–100.0)

## 2015-11-03 LAB — TSH: TSH: 0.689 u[IU]/mL (ref 0.350–4.500)

## 2015-11-03 MED ORDER — TIOTROPIUM BROMIDE MONOHYDRATE 2.5 MCG/ACT IN AERS: 2.0000 | INHALATION_SPRAY | Freq: Every day | RESPIRATORY_TRACT | Status: DC

## 2015-11-03 MED ORDER — FUROSEMIDE 40 MG PO TABS
40.0000 mg | ORAL_TABLET | Freq: Every day | ORAL | Status: DC
  Administered 2015-11-04: 40 mg via ORAL
  Filled 2015-11-03: qty 1

## 2015-11-03 MED ORDER — IBUPROFEN 800 MG PO TABS
800.0000 mg | ORAL_TABLET | Freq: Four times a day (QID) | ORAL | Status: DC | PRN
  Filled 2015-11-03: qty 1

## 2015-11-03 MED ORDER — SODIUM CHLORIDE 0.9% FLUSH: 3.0000 mL | INTRAVENOUS | Status: DC | PRN

## 2015-11-03 MED ORDER — LAMOTRIGINE 25 MG PO TABS
200.0000 mg | ORAL_TABLET | Freq: Every day | ORAL | Status: DC
  Filled 2015-11-03: qty 8

## 2015-11-03 MED ORDER — ATORVASTATIN CALCIUM 80 MG PO TABS
80.0000 mg | ORAL_TABLET | Freq: Every evening | ORAL | Status: DC
  Administered 2015-11-03: 80 mg via ORAL
  Filled 2015-11-03: qty 1

## 2015-11-03 MED ORDER — ASPIRIN EC 81 MG PO TBEC
81.0000 mg | DELAYED_RELEASE_TABLET | Freq: Every morning | ORAL | Status: DC
Start: 2015-11-03 — End: 2015-11-04
  Administered 2015-11-04: 81 mg via ORAL
  Filled 2015-11-03: qty 1

## 2015-11-03 MED ORDER — ALBUTEROL SULFATE (2.5 MG/3ML) 0.083% IN NEBU
2.5000 mg | INHALATION_SOLUTION | RESPIRATORY_TRACT | Status: DC | PRN
  Administered 2015-11-03 – 2015-11-04 (×3): 2.5 mg via RESPIRATORY_TRACT
  Filled 2015-11-03 (×3): qty 3

## 2015-11-03 MED ORDER — HEPARIN SODIUM (PORCINE) 5000 UNIT/ML IJ SOLN
5000.0000 [IU] | Freq: Three times a day (TID) | INTRAMUSCULAR | Status: DC
  Administered 2015-11-03 – 2015-11-04 (×3): 5000 [IU] via SUBCUTANEOUS
  Filled 2015-11-03 (×3): qty 1

## 2015-11-03 MED ORDER — ACETAMINOPHEN 325 MG PO TABS: 650.0000 mg | ORAL_TABLET | Freq: Four times a day (QID) | ORAL | Status: DC | PRN

## 2015-11-03 MED ORDER — NICOTINE 14 MG/24HR TD PT24
14.0000 mg | MEDICATED_PATCH | Freq: Every day | TRANSDERMAL | Status: DC
  Administered 2015-11-03 – 2015-11-04 (×2): 14 mg via TRANSDERMAL
  Filled 2015-11-03 (×2): qty 1

## 2015-11-03 MED ORDER — INSULIN ASPART 100 UNIT/ML ~~LOC~~ SOLN: 0.0000 [IU] | Freq: Three times a day (TID) | SUBCUTANEOUS | Status: DC

## 2015-11-03 MED ORDER — TIOTROPIUM BROMIDE MONOHYDRATE 18 MCG IN CAPS
18.0000 ug | ORAL_CAPSULE | Freq: Every day | RESPIRATORY_TRACT | Status: DC
  Administered 2015-11-04: 18 ug via RESPIRATORY_TRACT
  Filled 2015-11-03: qty 5

## 2015-11-03 MED ORDER — INSULIN ASPART 100 UNIT/ML ~~LOC~~ SOLN
15.0000 [IU] | Freq: Once | SUBCUTANEOUS | Status: AC
  Administered 2015-11-03: 15 [IU] via SUBCUTANEOUS

## 2015-11-03 MED ORDER — INSULIN NPH (HUMAN) (ISOPHANE) 100 UNIT/ML ~~LOC~~ SUSP
50.0000 [IU] | Freq: Two times a day (BID) | SUBCUTANEOUS | Status: DC
  Filled 2015-11-03: qty 10

## 2015-11-03 MED ORDER — LORATADINE 10 MG PO TABS
10.0000 mg | ORAL_TABLET | Freq: Every day | ORAL | Status: DC
  Administered 2015-11-03 – 2015-11-04 (×2): 10 mg via ORAL
  Filled 2015-11-03 (×2): qty 1

## 2015-11-03 MED ORDER — MOMETASONE FURO-FORMOTEROL FUM 200-5 MCG/ACT IN AERO
2.0000 | INHALATION_SPRAY | Freq: Two times a day (BID) | RESPIRATORY_TRACT | Status: DC
  Administered 2015-11-03 – 2015-11-04 (×2): 2 via RESPIRATORY_TRACT
  Filled 2015-11-03: qty 8.8

## 2015-11-03 MED ORDER — SODIUM CHLORIDE 0.9 % IV SOLN: 250.0000 mL | INTRAVENOUS | Status: DC | PRN

## 2015-11-03 MED ORDER — INSULIN REGULAR HUMAN (CONC) 500 UNIT/ML ~~LOC~~ SOPN
30.0000 [IU] | PEN_INJECTOR | Freq: Once | SUBCUTANEOUS | Status: AC
  Administered 2015-11-03: 30 [IU] via SUBCUTANEOUS
  Filled 2015-11-03: qty 3

## 2015-11-03 MED ORDER — LISINOPRIL 10 MG PO TABS
10.0000 mg | ORAL_TABLET | Freq: Every day | ORAL | Status: DC
  Administered 2015-11-03: 10 mg via ORAL
  Filled 2015-11-03: qty 1

## 2015-11-03 MED ORDER — PREGABALIN 75 MG PO CAPS
150.0000 mg | ORAL_CAPSULE | Freq: Two times a day (BID) | ORAL | Status: DC
  Administered 2015-11-03 – 2015-11-04 (×3): 150 mg via ORAL
  Filled 2015-11-03 (×3): qty 2

## 2015-11-03 MED ORDER — FAMOTIDINE 20 MG PO TABS
20.0000 mg | ORAL_TABLET | Freq: Two times a day (BID) | ORAL | Status: DC
  Administered 2015-11-04: 20 mg via ORAL
  Filled 2015-11-03 (×2): qty 1

## 2015-11-03 MED ORDER — CLONAZEPAM 0.5 MG PO TABS
1.0000 mg | ORAL_TABLET | Freq: Three times a day (TID) | ORAL | Status: DC
  Administered 2015-11-03 – 2015-11-04 (×3): 1 mg via ORAL
  Filled 2015-11-03 (×3): qty 2

## 2015-11-03 MED ORDER — QUETIAPINE FUMARATE ER 400 MG PO TB24
800.0000 mg | ORAL_TABLET | Freq: Every day | ORAL | Status: DC
  Administered 2015-11-03: 800 mg via ORAL
  Filled 2015-11-03 (×2): qty 2

## 2015-11-03 MED ORDER — FLUOXETINE HCL 20 MG PO CAPS
40.0000 mg | ORAL_CAPSULE | Freq: Every day | ORAL | Status: DC
  Administered 2015-11-04: 40 mg via ORAL
  Filled 2015-11-03 (×2): qty 2

## 2015-11-03 MED ORDER — SODIUM CHLORIDE 0.9% FLUSH
3.0000 mL | Freq: Two times a day (BID) | INTRAVENOUS | Status: DC
  Administered 2015-11-03: 3 mL via INTRAVENOUS

## 2015-11-03 MED ORDER — ACETAMINOPHEN 650 MG RE SUPP: 650.0000 mg | Freq: Four times a day (QID) | RECTAL | Status: DC | PRN

## 2015-11-03 NOTE — Progress Notes (Signed)
Date of Visit: 11/03/2015   HPI:  Patient presents for a same day appointment to discuss leg swelling.  Has history of edema in the past, but states it got worse about a week ago. Feels that both lower extremities are swollen and red. Feels sensation of choking on fluid at night, as if she's drowning in fluid whenever she lays down. Improves some with sitting up. Denies fever. Cough is productive of clear frothy sputum. She wears O2 2L at night chronically. Denies CP. Also thinks her kidneys are not working well as she's been urinating less lately. Sometimes leaks with coughing.  Has history of poorly controlled diabetes and reports her sugars have been in the high 500s. She uses U-500 insulin and took 200 units yesterday morning and evening.  She was recently treated for a presumed COPD exacerbation on 4/6 by Dr. Andria Frames here at the [facility]. At that time complained of rib pain, treated with narcotic for likely stress fracture of rib.  ROS: See HPI  [place]: COPD/asthma, uncontrolled type 2 diabetes, diabetic neuropathy, hypertension, tobacco abuse, bipolar disorder, seizure disorder, prior TIA  PHYSICAL EXAM: BP 141/63 mmHg  Pulse 106  Temp(Src) 98.5 F (36.9 C) (Oral)  Ht 5\' 6"  (1.676 m)  Wt 267 lb 6.4 oz (121.292 kg)  BMI 43.18 kg/m2  LMP 11/11/2011 Gen: NAD, pleasant, cooperative HEENT: normocephalic, atraumatic. Moist mucous membranes  Heart: regular rate and rhythm, no murmur Lungs: occasional wheeze. Some decreased breath sounds at bilateral bases. Overall lungs clear. Normal work of breathing at rest. Abdomen: soft nontender to palpation  Neuro: alert, grossly nonfocal, speech normal Extremities: 2+ edema bilateral lower extremities. Minimal if any appreciable redness over bilateral legs. 2+ dp pulses bilaterally. Negative homans. No cords or calf tenderness, no warmth.  ASSESSMENT/PLAN:  45 yo F with several chronic medical issues presenting with one week of  increasing lower extremity edema, orthopnea, clear frothy sputum, and dyspnea on exertion. Exam is remarkable for noted lower extremity edema and tachycardia (appears chronic). No hypoxia on ambulation (remained >92% on room air while ambulating). EKG is unchanged from prior. Concern for new onset CHF given constellation of historical elements. No history of CHF noted on problem list or history. Last echo was done two years ago via transesophageal approach in May 2015 and showed EF 60-65% without commentary on diastolic function. Discussed options for evaluation and treatment with patient, with specific attention to outpatient vs inpatient care. Gave option of outpatient management with updating echo, getting CXR, and increasing dose of lasix with close outpatient follow up. Patient prefers inpatient evaluation, stressing the discomfort of feeling like she is choking while sleeping. Admission is reasonable. Will admit for CXR, updated echo, check troponin & other labs, dose IV lasix, & monitor urine output. Contacted Dr. Lonny Prude, on call resident, who will see patient for admission.  FOLLOW UP: Admitting directly to the hospital.  Delorse Limber. Ardelia Mems, [place]

## 2015-11-03 NOTE — Progress Notes (Signed)
Family Medicine Teaching Service Daily Progress Note Intern Pager: [phone]  Patient name: [name] Medical record number: [id number] Date of birth: 1970-11-24 Age: 45 y.o. Gender: female  Primary Care Provider: Ronnie Doss, DO Consultants: None Code Status: FULL  Pt Overview and Major Events to Date:  4/25 - admitted to FPTS  Assessment and Plan: [name] is a 45 y.o. female presenting with lower extremity edema and possible orthopnea . PMH is significant for seizure disorder, COPD, anxiety, hyperlipidemia, hypertension  Concern for orthopnea: Patient endorses symptoms that sound similar. There is concern for possible new onset heart failure. Differential includes anxiety, COPD, GERD. Previous echo in 2015 significant for EF of 60-65% and trivial aortic regurgitation. Stress test in 2014 was negative per patient. Overall, difficult to fully assess fluid status but has trace edema. EKG unchanged. BNP 20. CXR with minima atelectasis but clear otherwise.  - Telemetry - Echo - Daily weights - Strict I/Os - Continue home Lasix   COPD: Patient on O2 at night. Recently treated for COPD exacerbation. - Continue Spiriva, Dulera (formulary for Advair) and albuterol  Urinary hesitancy/decreased stream: UA in office not significant for an infection. 1.45L UOP yesterday.  - Monitor urine output (strict in/out) - Consider PVR depending on output  DM2: Home insulin Regular 100-150u BID. CBGs in mid 500s overnight.  - NPH 50U BID while inpatient - SSI moderate - Continue Lyrica  Hypertension: BP 101/59, 159/74 overnight.  - Continue lisinopril  Hyperlipidemia - Continue atorvastatin  Mood/Anxiety - Continue Klonopin, Seroquel, Prozac  History of seizures disorder - Continue lamictal  Seasonal allergies - Continue Claritin  FEN/GI: Heart healthy/carb modified Prophylaxis: Heparin subq  Disposition: pending medical improvement   Subjective:  Patient wishing to  leave AMA as her blood sugars are still elevated. Reports that she does not feel well and wants to go home and take her own insulin. Explained risks of leaving against medical advice (dangers of continued hyperglycemia, dangers of undiagnosed CHF), and patient voiced understanding. Also explained payment implications of leaving AMA. Patient still wishing to leave despite education.   Objective: Temp:  [97.6 F (36.4 C)-98.5 F (36.9 C)] 97.6 F (36.4 C) (04/26 0500) Pulse Rate:  [102-121] 102 (04/26 0500) Resp:  [20] 20 (04/26 0500) BP: (101-159)/(59-74) 101/59 mmHg (04/26 0500) SpO2:  [87 %-93 %] 93 % (04/26 0500) Weight:  [263 lb 11.2 oz (119.614 kg)-267 lb 6.4 oz (121.292 kg)] 264 lb 8 oz (119.976 kg) (04/26 0500) Physical Exam: General: sitting on bedside in NAD, receiving breathing treatment Cardiovascular: RRR, no murmurs appreciated Respiratory: Scattered wheezes; breathing comfortably on RA Abdomen: soft, non-tender, non-distended, +BS Neuro: A&Ox3, no focal deficits Psych: appropriate mood and affect; not agitated  Laboratory:  Recent Labs Lab 11/03/15 1504  WBC 11.3*  HGB 13.9  HCT 41.8  PLT 233    Recent Labs Lab 11/03/15 1504 11/04/15 0401  NA 132* 129*  K 4.1 4.3  CL 90* 90*  CO2 27 26  BUN 14 20  CREATININE 1.00 1.15*  CALCIUM 9.5 9.1  PROT 7.5  --   BILITOT 0.9  --   ALKPHOS 123  --   ALT 86*  --   AST 72*  --   GLUCOSE 545* 523*    Imaging/Diagnostic Tests: No results found.   Verner Mould, MD 11/04/2015, 9:26 AM PGY-1, [place] Intern pager: [phone], text pages welcome

## 2015-11-03 NOTE — H&P (Signed)
[facility] Admission History and Physical Service Pager: [phone]  Patient name: [name] Medical record number: [id number] Date of birth: March 22, 1971 Age: 45 y.o. Gender: female  Primary Care Provider: Ronnie Doss, DO Consultants: None Code Status: Full Code  Chief Complaint: Fluid in throat  Assessment and Plan: [name] is a 45 y.o. female presenting with lower extremity edema and possible orthopnea . PMH is significant for seizure disorder, COPD, anxiety, hyperlipidemia, hypertension  Concern for orthopnea:  Patient endorses symptoms that sound similar. There is concern for possible new onset heart failure. Differential includes anxiety, COPD, GERD. Previous echo in 2015 significant for EF of 60-65% and trivial aortic regurgitation. Stress test in 2014 was negative per patient. Overall, difficult to fully assess fluid status but has trace edema. EKG unchanged - Admit to telemetry, attending Ardelia Mems - CXR - Echo - Will hold lasix until metabolic panel results are available - daily weights - strict in/out  COPD: patient on O2 at night. Recently treated for COPD exacerbation - continue Spiriva, Dulera (formulary for Advair) and albuterol  Urinary hesitancy/decreased stream: UA in office not significant for an infection. - monitor urine output (strict in/out) - consider PVR depending on output - follow-up BMP to assess kidney function as AKI could be causing symptoms  DM2: patient on insulin Regular 100-150u BID. - switch to to NPH 50u BID - SSI moderate - continue Lyrica  Hypertension - hold lisinopril until BMP obtained  Hyperlipidemia - continue atorvastatin  Mood/Anxiety - continue Klonopin, Seroquel, Prozac  Seizures  Continue lamictal  Seasonal allergies - continue Claritin  FEN/GI: Heart healthy/carb modified Prophylaxis: Heparin subq  Disposition: Admit to telemetry  History of Present Illness:  [name]  is a 45 y.o. female presenting with orthopnea. Patient states she feels fluid in her throat. Symptoms started about one week ago. She states she feels like it's fluid because she has not been urinating as much and feels the fluid has been backing up. She has not been able to lie flat for a few weeks secondary to feeling like she has fluid in her throat and some mild dyspnea. She endorses coughing with clear sputum. She has increased dyspnea on exertion She endorses some urinary hesitancy, frequency and is without dysuria. No fevers, nausea, vomiting, chest pain, palpitations   Review Of Systems: Per HPI with the following additions: Otherwise the remainder of the systems were negative.  Patient Active Problem List   Diagnosis Date Noted  . COPD exacerbation ([place]) 10/15/2015  . Fracture of rib, closed 10/15/2015  . Venous stasis of both lower extremities 03/29/2015  . Preventative health care 02/04/2015  . Essential hypertension 01/02/2015  . Inguinal hernia 11/27/2014  . Allergic rhinitis with postnasal drip 01/21/2014  . Aortic valve disorders 04/18/2013  . Numbness and tingling 04/01/2013  . Sleep apnea 05/22/2012  . Seizure disorder ([place]) 10/09/2011  . Bipolar disorder ([place]) 10/09/2011  . TIA (transient ischemic attack) 06/22/2011  . Diabetic polyneuropathy ([place]) 06/08/2010  . HYPERTRIGLYCERIDEMIA 07/17/2007  . Anxiety state, unspecified 05/30/2007  . COPD (chronic obstructive pulmonary disease) ([place]) 05/30/2007  . OBESITY NOS 04/23/2007  . DM (diabetes mellitus), type 2, uncontrolled ([place]) 03/22/2007  . TOBACCO DEPENDENCE 09/07/2006  . ASTHMA, PERSISTENT 09/07/2006    Past Medical History: Past Medical History  Diagnosis Date  . Hyperlipidemia     takes Welchol daily  . Inguinal hernia, left 01/2015  . COPD (chronic obstructive pulmonary disease) (HCC)     Albuterol daily as  needed  . GERD (gastroesophageal reflux disease)     takes Pepcid daily  . Muscle spasm     takes  Flexeril daily as needed  . Hypertension     takes Lisinopril daily  . Peripheral neuropathy (HCC)     takes Lyrica daily  . Constipation     takes Miralax daily as needed  . Asthma     Advair and Spirva daily  . Family history of adverse reaction to anesthesia     mom gets nauseated  . History of blood clots     left leg 3-60yrs ago  . Shortness of breath dyspnea     with exertion  . Pneumonia 2005    hx of  . History of bronchitis 2015  . Smokers' cough ([place])   . Seizures ([place])     takes Lamictal daily;last seizure over a yr ago  . Stroke [facility]) 1989    left sided weakness  . Back pain     reason unknown  . Urinary frequency   . Nocturia   . Anxiety     takes Prozac daily  . Diabetes mellitus     takes Invokana,Victoza,and Hum N daily;fasting blood sugar runs 200-300    Past Surgical History: Past Surgical History  Procedure Laterality Date  . Laparoscopy      Endometriosis  . Tee without cardioversion N/A 11/28/2013    Procedure: TRANSESOPHAGEAL ECHOCARDIOGRAM (TEE);  Surgeon: Thayer Headings, MD;  Location: [facility];  Service: Cardiovascular;  Laterality: N/A;  . Left heart catheterization with coronary angiogram N/A 12/05/2013    Procedure: LEFT HEART CATHETERIZATION WITH CORONARY ANGIOGRAM;  Surgeon: Jettie Booze, MD;  Location: [facility] CATH LAB;  Service: Cardiovascular;  Laterality: N/A;  . Right kidney drained    . Inguinal hernia repair Left 04/08/2015    Procedure: OPEN LEFT INGUINAL HERNIA REPAIR WITH MESH;  Surgeon: Ralene Ok, MD;  Location: [facility];  Service: General;  Laterality: Left;  . Insertion of mesh Left 04/08/2015    Procedure: INSERTION OF MESH;  Surgeon: Ralene Ok, MD;  Location: [facility];  Service: General;  Laterality: Left;    Social History: Social History  Substance Use Topics  . Smoking status: Current Every Day Smoker -- 1.00 packs/day for 28 years    Types: Cigarettes  . Smokeless tobacco: Never Used  . Alcohol Use: No    Additional social history:   Please also refer to relevant sections of EMR.  Family History: Family History  Problem Relation Age of Onset  . Venous thrombosis Brother   . Other Brother     BRAIN TUMOR  . Asthma Father   . Diabetes Father   . Coronary artery disease Mother   . Hypertension Mother   . Diabetes Mother   . Asthma Sister   . Diabetes type II Brother    Allergies and Medications: Allergies  Allergen Reactions  . Metoprolol Shortness Of Breath    Occurrence of shortness of breath after 3 days  . Montelukast Sodium Shortness Of Breath    ER visit for swollen throat  . Morphine Sulfate Shortness Of Breath and Nausea And Vomiting    Swollen Throat - Able to tolerate dilaudid  . Penicillins Hives and Shortness Of Breath    Swelling on throat  . Gabapentin Swelling   No current facility-administered medications on file prior to encounter.   Current Outpatient Prescriptions on File Prior to Encounter  Medication Sig Dispense Refill  . aspirin EC  81 MG tablet Take 81 mg by mouth every morning.    Marland Kitchen atorvastatin (LIPITOR) 80 MG tablet Take 1 tablet (80 mg total) by mouth every evening. 30 tablet 12  . clonazePAM (KLONOPIN) 1 MG tablet Take 1 mg by mouth 3 (three) times daily.     . colesevelam (WELCHOL) 625 MG tablet Take 1,250 mg by mouth 2 (two) times daily with a meal.    . cyclobenzaprine (FLEXERIL) 10 MG tablet Take 1 tablet (10 mg total) by mouth 3 (three) times daily as needed for muscle spasms. 30 tablet 0  . doxycycline (VIBRA-TABS) 100 MG tablet Take 1 tablet (100 mg total) by mouth 2 (two) times daily. 20 tablet 0  . famotidine (PEPCID) 20 MG tablet Take 20 mg by mouth 2 (two) times daily.    Marland Kitchen FLUoxetine (PROZAC) 40 MG capsule Take 40 mg by mouth daily.     . Fluticasone-Salmeterol (ADVAIR DISKUS) 500-50 MCG/DOSE AEPB Inhale 1 puff into the lungs daily. 60 each   . furosemide (LASIX) 40 MG tablet Take 1 tablet (40 mg total) by mouth daily. 90 tablet 3  .  HYDROcodone-acetaminophen (NORCO) 5-325 MG tablet Take 1 tablet by mouth 3 (three) times daily. 12 tablet 0  . ibuprofen (ADVIL,MOTRIN) 200 MG tablet Take 600 mg by mouth 2 (two) times daily as needed.    . insulin regular human CONCENTRATED (HUMULIN R) 500 UNIT/ML SOLN injection Inject 100-180 Units into the skin 2 (two) times daily with a meal. 100 units = 200, 180 units = 300 and above    . lamoTRIgine (LAMICTAL) 200 MG tablet Take 200 mg by mouth at bedtime.    Marland Kitchen lisinopril (PRINIVIL,ZESTRIL) 10 MG tablet TAKE ONE TABLET BY MOUTH EVERY NIGHT AT BEDTIME 90 tablet 0  . loratadine (CLARITIN) 10 MG tablet Take 1 tablet (10 mg total) by mouth daily. 30 tablet 11  . ondansetron (ZOFRAN ODT) 4 MG disintegrating tablet Take 1 tablet (4 mg total) by mouth every 8 (eight) hours as needed for nausea or vomiting. 20 tablet 0  . oxyCODONE-acetaminophen (PERCOCET) 7.5-325 MG tablet Take 1 tablet by mouth every 4 (four) hours as needed for severe pain. (Patient not taking: Reported on 04/30/2015) 30 tablet 0  . OXYGEN Inhale 2 L into the lungs at bedtime.    . predniSONE (DELTASONE) 20 MG tablet Take 1 tablet (20 mg total) by mouth 3 (three) times daily. 15 tablet 0  . pregabalin (LYRICA) 150 MG capsule Take 1 capsule (150 mg total) by mouth 2 (two) times daily. 60 capsule 5  . PROVENTIL HFA 108 (90 Base) MCG/ACT inhaler INHALE TWO (2) PUFFS EVERY FOUR HOURS ASNEEDED FOR SHORTNESS OF BREATH 6.7 g 2  . QUEtiapine (SEROQUEL XR) 400 MG 24 hr tablet Take 800 mg by mouth at bedtime.    Marland Kitchen Respiratory Therapy Supplies (PARI SOFT PLASTIC ADULT MASK) MISC Use with oxygen, To be titrated by respiratory therapist 1 each 0  . Tiotropium Bromide Monohydrate (SPIRIVA RESPIMAT) 2.5 MCG/ACT AERS Inhale 2 puffs into the lungs daily. 1 Inhaler 3    Objective: Temp:  [98.5 F (36.9 C)] 98.5 F (36.9 C) (04/25 1020) Pulse Rate:  [106] 106 (04/25 1020) BP: (141)/(63) 141/63 mmHg (04/25 1020) Weight:  [267 lb 6.4 oz (121.292  kg)] 267 lb 6.4 oz (121.292 kg) (04/25 1020)  Exam: General: Well appearing, no distress Eyes: PERRLA, EOMI, no conjunctival injection ENTM: Oropharynx clear and moist Neck: Supple, unable to assess for JVD Cardiovascular: Regular rate  and rhythm. Normal S1 and S2. 2/6 systolic heart murmur present. No extra heart sounds. 1+ DP pulses bilaterally Respiratory: Bilateral scattered wheezes. Unlabored work of breathing. No wheezing or rales. Abdomen: Soft, non-tender, non-distended, no guarding, no rebound, no masses felt MSK: No calf tenderenss Skin: trace pitting edema bilaterally in legs Neuro: Alert, oriented Psych: Full affect  Labs and Imaging: CBC BMET  No results for input(s): WBC, HGB, HCT, PLT in the last 168 hours. No results for input(s): NA, K, CL, CO2, BUN, CREATININE, GLUCOSE, CALCIUM in the last 168 hours.   Mariel Aloe, MD 11/03/2015, 12:01 PM PGY-3, [facility] pager: [phone], text pages welcome

## 2015-11-03 NOTE — Discharge Summary (Signed)
[facility] Discharge Summary  Patient name: [name] Medical record number: [id number] Date of birth: 1970/09/14 Age: 45 y.o. Gender: female Date of Admission: 11/03/2015  Date of Discharge: 11/04/2015 Admitting Physician: Leeanne Rio, MD  Primary Care Provider: Ronnie Doss, DO Consultants: None  Indication for Hospitalization: orthopnea  Discharge Diagnoses/Problem List:  Patient Active Problem List   Diagnosis Date Noted  . CHF (congestive heart failure) ([place]) 11/04/2015  . Orthopnea 11/03/2015  . Dyspnea   . Bilateral leg edema   . Type 2 diabetes mellitus with complication, with long-term current use of insulin ([place])   . Decreased urine stream   . COPD exacerbation ([place]) 10/15/2015  . Fracture of rib, closed 10/15/2015  . Venous stasis of both lower extremities 03/29/2015  . Preventative health care 02/04/2015  . Essential hypertension 01/02/2015  . Inguinal hernia 11/27/2014  . Allergic rhinitis with postnasal drip 01/21/2014  . Aortic valve disorders 04/18/2013  . Numbness and tingling 04/01/2013  . Sleep apnea 05/22/2012  . Seizure disorder ([place]) 10/09/2011  . Bipolar disorder ([place]) 10/09/2011  . TIA (transient ischemic attack) 06/22/2011  . Diabetic polyneuropathy ([place]) 06/08/2010  . HYPERTRIGLYCERIDEMIA 07/17/2007  . Anxiety state, unspecified 05/30/2007  . COPD (chronic obstructive pulmonary disease) ([place]) 05/30/2007  . OBESITY NOS 04/23/2007  . DM (diabetes mellitus), type 2, uncontrolled ([place]) 03/22/2007  . TOBACCO DEPENDENCE 09/07/2006  . ASTHMA, PERSISTENT 09/07/2006     Disposition: home; LEFT AGAINST MEDICAL ADVICE  Discharge Condition: stable  Discharge Exam:  General: sitting on bedside in NAD, receiving breathing treatment Cardiovascular: RRR, no murmurs appreciated Respiratory: Scattered wheezes; breathing comfortably on RA Abdomen: soft, non-tender, non-distended, +BS Neuro: A&Ox3, no focal  deficits Psych: appropriate mood and affect; not agitated  Brief Hospital Course:  Patient presented with dyspnea and orthopnea.   Orthopnea Patient presented with dyspnea and orthopnea x1 wk. She was admitted for CHF work-up. CXR was obtained which showed minimal atelectasis but no other signs of CHF. Patient left against medical advice before echo and further work-up could be obtained.   Hyperglycemia Patient persistently hyperglycemia into the 500s throughout admission. She was given insulin, however less than her home dose of U500 (180U in AM, 200U in PM) due to concerns for hypoglycemia. Patient became upset that her CBG remained elevated, and wished to leave as she felt she could control her CBG better at home. Patient was advised against leaving given her persistent hyperglycemia, and informed of the dangers of persistent hyperglycemia. She still chose to leave AMA.   Issues for Follow Up:  1. Patient left before CHF work-up was completed. Encouraged patient to schedule f/u with PCP soon so she can continue work-up outpatient. Will need outpatient echo.   Significant Procedures: none  Significant Labs and Imaging:   Recent Labs Lab 11/03/15 1504  WBC 11.3*  HGB 13.9  HCT 41.8  PLT 233    Recent Labs Lab 11/03/15 1504 11/04/15 0401  NA 132* 129*  K 4.1 4.3  CL 90* 90*  CO2 27 26  GLUCOSE 545* 523*  BUN 14 20  CREATININE 1.00 1.15*  CALCIUM 9.5 9.1  ALKPHOS 123  --   AST 72*  --   ALT 86*  --   ALBUMIN 3.6  --     CXR - minimal bilateral atelectasis  Results/Tests Pending at Time of Discharge: none  Discharge Medications:    Medication List    ASK your doctor about these medications  ADVAIR DISKUS 500-50 MCG/DOSE Aepb  Generic drug:  Fluticasone-Salmeterol  Inhale 1 puff into the lungs daily.     aspirin EC 81 MG tablet  Take 81 mg by mouth every morning.     atorvastatin 80 MG tablet  Commonly known as:  LIPITOR  Take 1 tablet (80 mg total)  by mouth every evening.     clonazePAM 1 MG tablet  Commonly known as:  KLONOPIN  Take 1 mg by mouth 3 (three) times daily.     colesevelam 625 MG tablet  Commonly known as:  WELCHOL  Take 1,250 mg by mouth 2 (two) times daily with a meal.     cyclobenzaprine 10 MG tablet  Commonly known as:  FLEXERIL  Take 1 tablet (10 mg total) by mouth 3 (three) times daily as needed for muscle spasms.     famotidine 20 MG tablet  Commonly known as:  PEPCID  Take 20 mg by mouth 2 (two) times daily.     FLUoxetine 40 MG capsule  Commonly known as:  PROZAC  Take 40 mg by mouth daily.     furosemide 40 MG tablet  Commonly known as:  LASIX  Take 1 tablet (40 mg total) by mouth daily.     HYDROcodone-acetaminophen 5-325 MG tablet  Commonly known as:  NORCO  Take 1 tablet by mouth 3 (three) times daily.     ibuprofen 200 MG tablet  Commonly known as:  ADVIL,MOTRIN  Take 600 mg by mouth 2 (two) times daily as needed.     insulin regular human CONCENTRATED 500 UNIT/ML injection  Commonly known as:  HUMULIN R  Inject 100-180 Units into the skin 2 (two) times daily with a meal. 100 units = 200, 180 units = 300 and above     lamoTRIgine 200 MG tablet  Commonly known as:  LAMICTAL  Take 200 mg by mouth at bedtime.     lisinopril 10 MG tablet  Commonly known as:  PRINIVIL,ZESTRIL  TAKE ONE TABLET BY MOUTH EVERY NIGHT AT BEDTIME     loratadine 10 MG tablet  Commonly known as:  CLARITIN  Take 1 tablet (10 mg total) by mouth daily.     ondansetron 4 MG disintegrating tablet  Commonly known as:  ZOFRAN ODT  Take 1 tablet (4 mg total) by mouth every 8 (eight) hours as needed for nausea or vomiting.     OXYGEN  Inhale 2 L into the lungs at bedtime.     PARI SOFT PLASTIC ADULT MASK Misc  Use with oxygen, To be titrated by respiratory therapist     pregabalin 150 MG capsule  Commonly known as:  LYRICA  Take 1 capsule (150 mg total) by mouth 2 (two) times daily.     PROVENTIL HFA 108 (90  Base) MCG/ACT inhaler  Generic drug:  albuterol  INHALE TWO (2) PUFFS EVERY FOUR HOURS ASNEEDED FOR SHORTNESS OF BREATH     QUEtiapine 400 MG 24 hr tablet  Commonly known as:  SEROQUEL XR  Take 800 mg by mouth at bedtime.     Tiotropium Bromide Monohydrate 2.5 MCG/ACT Aers  Commonly known as:  SPIRIVA RESPIMAT  Inhale 2 puffs into the lungs daily.        Discharge Instructions: Please refer to Patient Instructions section of EMR for full details.  Patient was counseled important signs and symptoms that should prompt return to medical care, changes in medications, dietary instructions, activity restrictions, and follow up appointments.  Verner Mould, MD 11/04/2015, 1:28 PM PGY-1, [place]

## 2015-11-03 NOTE — Progress Notes (Signed)
Repeat cbg done 566 /placed a call [facility] MD  And spoken with Dr. Avon Gully with orders

## 2015-11-04 ENCOUNTER — Other Ambulatory Visit (HOSPITAL_COMMUNITY): Payer: BLUE CROSS/BLUE SHIELD

## 2015-11-04 DIAGNOSIS — R0600 Dyspnea, unspecified: Secondary | ICD-10-CM | POA: Diagnosis not present

## 2015-11-04 DIAGNOSIS — R0601 Orthopnea: Secondary | ICD-10-CM | POA: Diagnosis not present

## 2015-11-04 DIAGNOSIS — I5033 Acute on chronic diastolic (congestive) heart failure: Secondary | ICD-10-CM

## 2015-11-04 DIAGNOSIS — I5032 Chronic diastolic (congestive) heart failure: Secondary | ICD-10-CM

## 2015-11-04 HISTORY — DX: Chronic diastolic (congestive) heart failure: I50.32

## 2015-11-04 LAB — HEMOGLOBIN A1C
Hgb A1c MFr Bld: 13.4 % — ABNORMAL HIGH (ref 4.8–5.6)
Mean Plasma Glucose: 338 mg/dL

## 2015-11-04 LAB — BASIC METABOLIC PANEL
Anion gap: 13 (ref 5–15)
BUN: 20 mg/dL (ref 6–20)
CO2: 26 mmol/L (ref 22–32)
Calcium: 9.1 mg/dL (ref 8.9–10.3)
Chloride: 90 mmol/L — ABNORMAL LOW (ref 101–111)
Creatinine, Ser: 1.15 mg/dL — ABNORMAL HIGH (ref 0.44–1.00)
GFR calc Af Amer: >60 mL/min (ref >60)
GFR calc non Af Amer: 56 mL/min — ABNORMAL LOW (ref >60)
Glucose, Bld: 523 mg/dL — ABNORMAL HIGH (ref 65–99)
Potassium: 4.3 mmol/L (ref 3.5–5.1)
Sodium: 129 mmol/L — ABNORMAL LOW (ref 135–145)

## 2015-11-04 LAB — GLUCOSE, CAPILLARY: Glucose-Capillary: 523 mg/dL — ABNORMAL HIGH (ref 65–99)

## 2015-11-04 MED ORDER — INSULIN ASPART 100 UNIT/ML ~~LOC~~ SOLN
20.0000 [IU] | Freq: Once | SUBCUTANEOUS | Status: AC
  Administered 2015-11-04: 20 [IU] via SUBCUTANEOUS

## 2015-11-04 NOTE — Progress Notes (Signed)
Pt's CBG=523 this  Morning, family medicine on call notified, awaiting call back at this time.

## 2015-11-04 NOTE — Progress Notes (Signed)
Pt requesting to leave AMA signed Paperwork and placed in the chart. IV and tele removed awaiting ride home. Belongings packed and ready to go.

## 2015-11-05 ENCOUNTER — Encounter: Payer: Self-pay | Admitting: Family Medicine

## 2015-11-05 ENCOUNTER — Ambulatory Visit (INDEPENDENT_AMBULATORY_CARE_PROVIDER_SITE_OTHER): Payer: BLUE CROSS/BLUE SHIELD | Admitting: Family Medicine

## 2015-11-05 VITALS — BP 134/68 | HR 109 | Temp 98.3°F | Wt 264.7 lb

## 2015-11-05 DIAGNOSIS — Z794 Long term (current) use of insulin: Secondary | ICD-10-CM | POA: Diagnosis not present

## 2015-11-05 DIAGNOSIS — R0600 Dyspnea, unspecified: Secondary | ICD-10-CM | POA: Diagnosis not present

## 2015-11-05 DIAGNOSIS — E118 Type 2 diabetes mellitus with unspecified complications: Secondary | ICD-10-CM | POA: Diagnosis not present

## 2015-11-05 DIAGNOSIS — IMO0002 Reserved for concepts with insufficient information to code with codable children: Secondary | ICD-10-CM

## 2015-11-05 DIAGNOSIS — R600 Localized edema: Secondary | ICD-10-CM | POA: Diagnosis not present

## 2015-11-05 DIAGNOSIS — E1165 Type 2 diabetes mellitus with hyperglycemia: Secondary | ICD-10-CM

## 2015-11-05 LAB — GLUCOSE, CAPILLARY: Glucose-Capillary: 427 mg/dL — ABNORMAL HIGH (ref 65–99)

## 2015-11-05 NOTE — Progress Notes (Signed)
Subjective: CC: elevated BGs [id number] [name] is a 45 y.o. female presenting to clinic today for same day appointment. PCP: Ronnie Doss, DO Concerns today include:  1.  Elevated CBGs Notes that she has been experiencing pain in her left ribs.  She wonders if this causes her elevated BG.  She notes that she saw her endocrinologist about 2 months ago for elevated BG.  He increased her insulin to 120u TID with meals.  She has not noticed any change in glucose.  She does note that she was on Prednisone for COPD exacerbation on 4/6 for 5 days.  She notes BGs got up to 600 during that time.  Though continues to remain in 400-500s despite use of insulin and occ skipping meals.  Notes that she is eating almost no carbs except what is in vegetables.  Drinking strictly water.  Next f/u w/ endocrinology was pending evaluation for CHF in hospital.  No dizziness, CP.  Endorses neuropathy in hands and feet.  Endorses polyuria and polydipsia but not greater than has been over the last year.  No visual changes.  No fevers, chills.  2. DOE/ LE edema Patient notes that she has had DOE for some time now.  She reports that she was recently admitted for CHF evaluation as she also has LE and occ frothy sputum.  However, she "signed herself out" because "the RN there wouldn't let me inject my insulin".  Explained to her that this is protocol for patient safety.  She does wish to proceed with the heart evaluation outpatient. She notes that she has seen a cardiologist in the past, Dr Irish Lack.  She has had echo in the past but not for some time.  Denies CP, dizziness, visual disturbance.  Wears O2 at night time only.  Social History Reviewed: active smoker, precontemplative. FamHx and MedHx reviewed.  Please see EMR.  ROS: Per HPI  Objective: Office vital signs reviewed. BP 134/68 mmHg  Pulse 109  Temp(Src) 98.3 F (36.8 C) (Oral)  Wt 264 lb 11.2 oz (120.067 kg)  LMP 11/11/2011  Physical Examination:    General: Awake, alert, obese, No acute distress HEENT: Normal, MMM, EOMI, wears glasses, no JVD Cardio: regular rate and rhythm, S1S2 heard, no murmurs appreciated Pulm: global expiratory wheeze, no rhonchi or rales, normal WOB on room air. Extremities: warm, +1-2 LE edema bilaterally to mid shin. +2 LE pulses bilaterally MSK: Normal gait and station  Results for orders placed or performed in visit on 11/05/15 (from the past 24 hour(s))  Glucose, capillary     Status: Abnormal   Collection Time: 11/05/15  2:03 PM  Result Value Ref Range   Glucose-Capillary 427 (H) 65 - 99 mg/dL    Assessment/ Plan: 45 y.o. female   1. Uncontrolled type 2 diabetes mellitus with complication, with long-term current use of insulin ([place]).  BG is 427.  This is apparently where patient has been running for >1 month.  She is not exhibiting signs of DKA.  However, we discussed at length that she is at risk for HHS and other DM2 complications if she continues to have elevated BG.  She likely needs her insulin titrated up.  From what she tells me she eats relatively small amounts of carbs.  She does not appear to have an infection causing elevated BGs and she has not been on steroids for >2 weeks now. - Glucose (CBG) - HIGHLY recommend that she schedule with her endocrinologist ASAP for insulin titration. - STRICT return  precautions reviewed.  2. Dyspnea.  Likely from COPD not a CHF exacerbation.  Expiratory wheeze on exam.  Patient continues to smoke.  Given that she has LE will also order echo. - Continue COPD meds - STOP smoking - Recommended that she see her cardiologist.   - Echocardiogram; Future  3. Bilateral edema of lower extremity.  BNP 29.  Low likelihood that this is CHF but think echo is reasonable given trivial aortic regurg seen on 2015 echo.  More likely this is venous stasis. - Echocardiogram; Future - Recommend limiting salt intake, elevation of LE, increase physical activity, use compression  stockings.   Schedule annual exam w/ fasting labs.  Janora Norlander, DO PGY-2, [place]

## 2015-11-05 NOTE — Telephone Encounter (Signed)
Pt being seen today. Will inform pt at time of visit. Ottis Stain, CMA

## 2015-11-05 NOTE — Patient Instructions (Signed)
You heart doctor's name is Dr Irish Lack.  Schedule an appointment with him.  I have ordered an ultrasound of your heart in preparation for this appointment.  Schedule an appointment with Dr Buddy Duty at [facility] as soon as possible for your sugars.

## 2015-11-16 ENCOUNTER — Telehealth: Payer: Self-pay | Admitting: *Deleted

## 2015-11-16 ENCOUNTER — Ambulatory Visit (HOSPITAL_COMMUNITY): Payer: BLUE CROSS/BLUE SHIELD | Attending: Internal Medicine

## 2015-11-16 ENCOUNTER — Other Ambulatory Visit: Payer: Self-pay

## 2015-11-16 DIAGNOSIS — I358 Other nonrheumatic aortic valve disorders: Secondary | ICD-10-CM | POA: Diagnosis not present

## 2015-11-16 DIAGNOSIS — R600 Localized edema: Secondary | ICD-10-CM

## 2015-11-16 DIAGNOSIS — Z720 Tobacco use: Secondary | ICD-10-CM | POA: Diagnosis not present

## 2015-11-16 DIAGNOSIS — I119 Hypertensive heart disease without heart failure: Secondary | ICD-10-CM | POA: Diagnosis not present

## 2015-11-16 DIAGNOSIS — E119 Type 2 diabetes mellitus without complications: Secondary | ICD-10-CM | POA: Insufficient documentation

## 2015-11-16 DIAGNOSIS — Z8249 Family history of ischemic heart disease and other diseases of the circulatory system: Secondary | ICD-10-CM | POA: Diagnosis not present

## 2015-11-16 DIAGNOSIS — E785 Hyperlipidemia, unspecified: Secondary | ICD-10-CM | POA: Insufficient documentation

## 2015-11-16 DIAGNOSIS — R0600 Dyspnea, unspecified: Secondary | ICD-10-CM | POA: Diagnosis not present

## 2015-11-16 NOTE — Telephone Encounter (Signed)
Wants to know results of ultrasound. Please advise. Deseree Kennon Holter, CMA

## 2015-11-16 NOTE — Telephone Encounter (Signed)
Done

## 2015-11-27 DIAGNOSIS — F3131 Bipolar disorder, current episode depressed, mild: Secondary | ICD-10-CM | POA: Diagnosis not present

## 2015-12-01 ENCOUNTER — Other Ambulatory Visit: Payer: Self-pay | Admitting: Family Medicine

## 2015-12-01 ENCOUNTER — Telehealth: Payer: Self-pay | Admitting: Family Medicine

## 2015-12-01 DIAGNOSIS — G894 Chronic pain syndrome: Secondary | ICD-10-CM

## 2015-12-01 NOTE — Telephone Encounter (Addendum)
Need referral to the [facility] in [place].  Tel # [phone].  Fax# [phone].  Dr. Laverle Patter is who she will be seeing.

## 2015-12-01 NOTE — Telephone Encounter (Signed)
Pt informed. Nickie Warwick T Matej Sappenfield, CMA  

## 2015-12-01 NOTE — Telephone Encounter (Signed)
Referral placed.  Patient currently seeing pain management for chronic LE pain and wishes to switch to Tel # [phone]. Fax# [phone]. Dr. Laverle Patter is who she will be seeing.

## 2015-12-01 NOTE — Telephone Encounter (Signed)
FYI

## 2015-12-04 DIAGNOSIS — I10 Essential (primary) hypertension: Secondary | ICD-10-CM | POA: Diagnosis not present

## 2015-12-04 DIAGNOSIS — D511 Vitamin B12 deficiency anemia due to selective vitamin B12 malabsorption with proteinuria: Secondary | ICD-10-CM | POA: Diagnosis not present

## 2015-12-04 DIAGNOSIS — E1165 Type 2 diabetes mellitus with hyperglycemia: Secondary | ICD-10-CM | POA: Diagnosis not present

## 2015-12-04 DIAGNOSIS — E119 Type 2 diabetes mellitus without complications: Secondary | ICD-10-CM | POA: Diagnosis not present

## 2015-12-04 DIAGNOSIS — G2581 Restless legs syndrome: Secondary | ICD-10-CM | POA: Diagnosis not present

## 2015-12-04 DIAGNOSIS — J449 Chronic obstructive pulmonary disease, unspecified: Secondary | ICD-10-CM | POA: Diagnosis not present

## 2015-12-04 DIAGNOSIS — E559 Vitamin D deficiency, unspecified: Secondary | ICD-10-CM | POA: Diagnosis not present

## 2015-12-04 DIAGNOSIS — I509 Heart failure, unspecified: Secondary | ICD-10-CM | POA: Diagnosis not present

## 2015-12-04 DIAGNOSIS — Z6841 Body Mass Index (BMI) 40.0 and over, adult: Secondary | ICD-10-CM | POA: Diagnosis not present

## 2015-12-04 DIAGNOSIS — F17200 Nicotine dependence, unspecified, uncomplicated: Secondary | ICD-10-CM | POA: Diagnosis not present

## 2015-12-04 IMAGING — DX DG ANKLE COMPLETE 3+V*L*
3 series · 3 of 3 positions shown · non-contrast
Comparison: None.

CLINICAL DATA: Per pt: coming down the steps a week ago and the
left ankle popped. Patient pointed to the left ankle, medial, distal
malleolus, anterior, plantar, os-calcis. Patient stated that she
fractured the left ankle almost a year ago. Patient is a diabetic.
Patient also has neuropathy in both feet and both legs.

EXAM:
LEFT ANKLE COMPLETE - 3+ VIEW

[ankle ap]
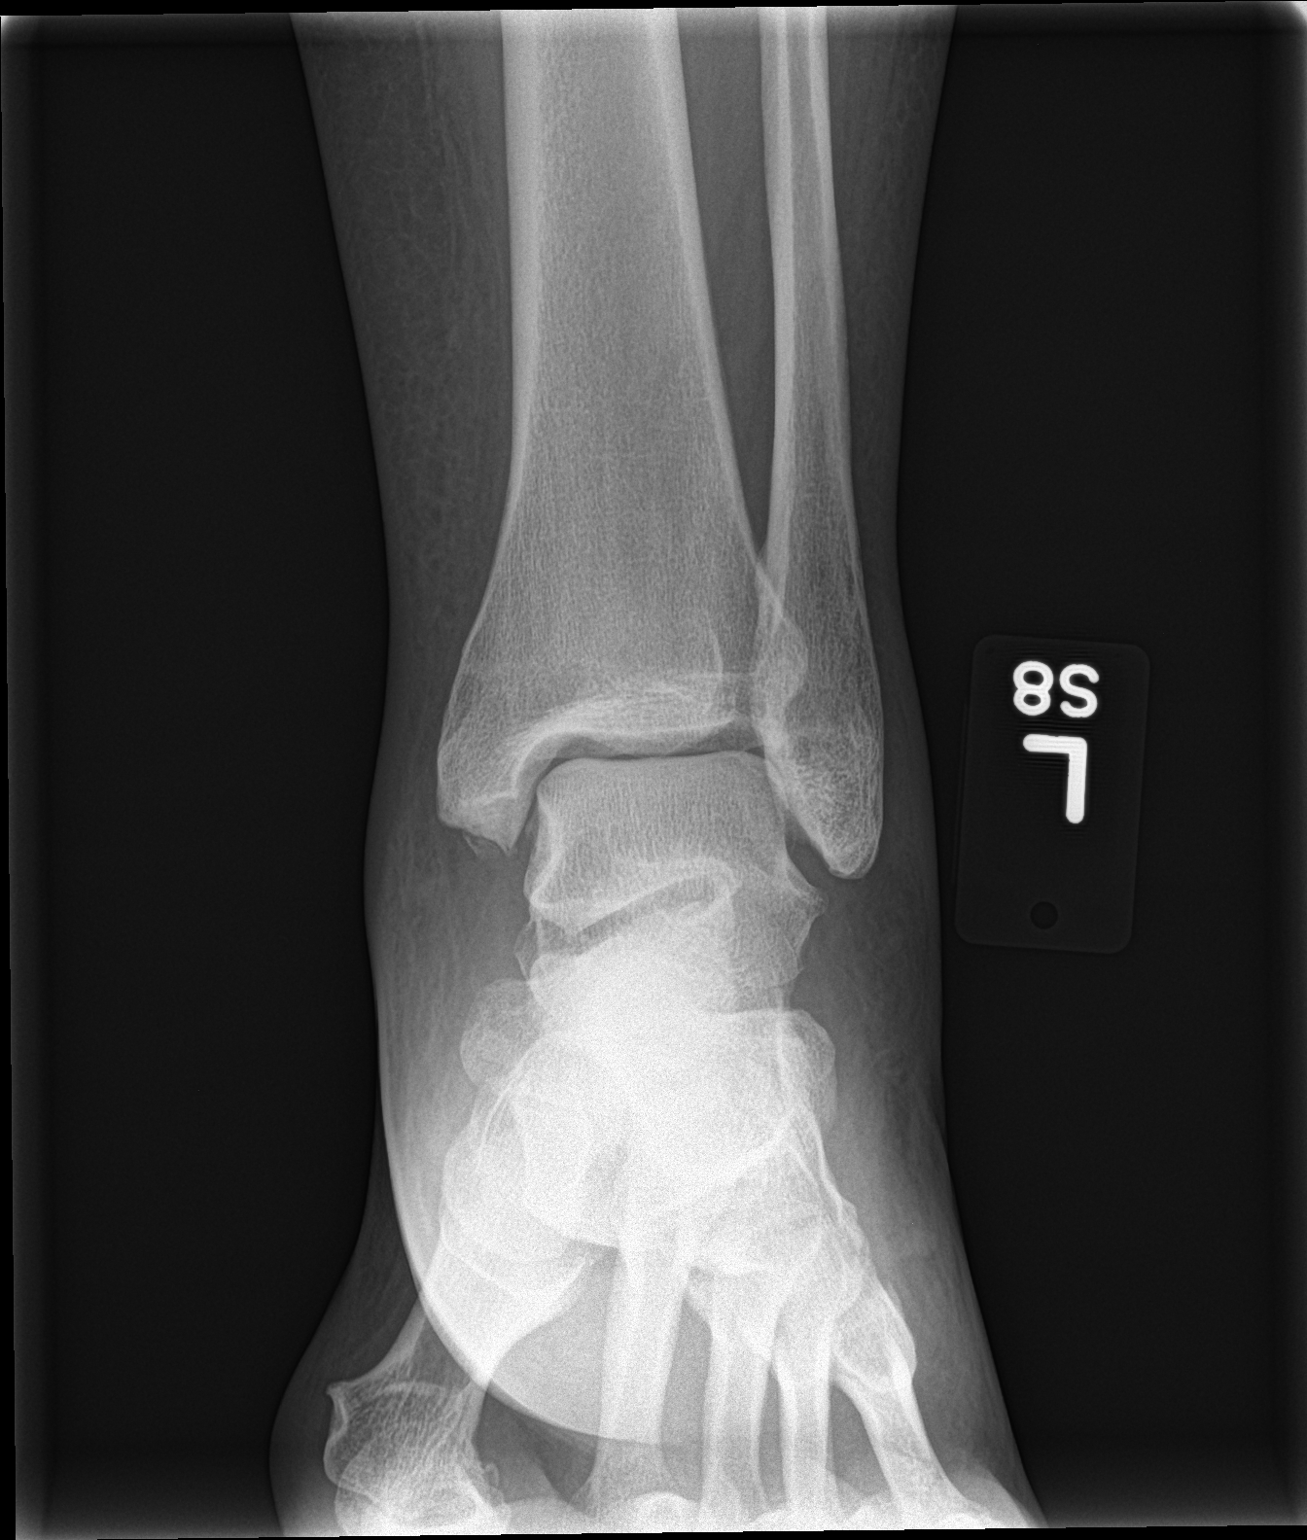

[ankle obl]
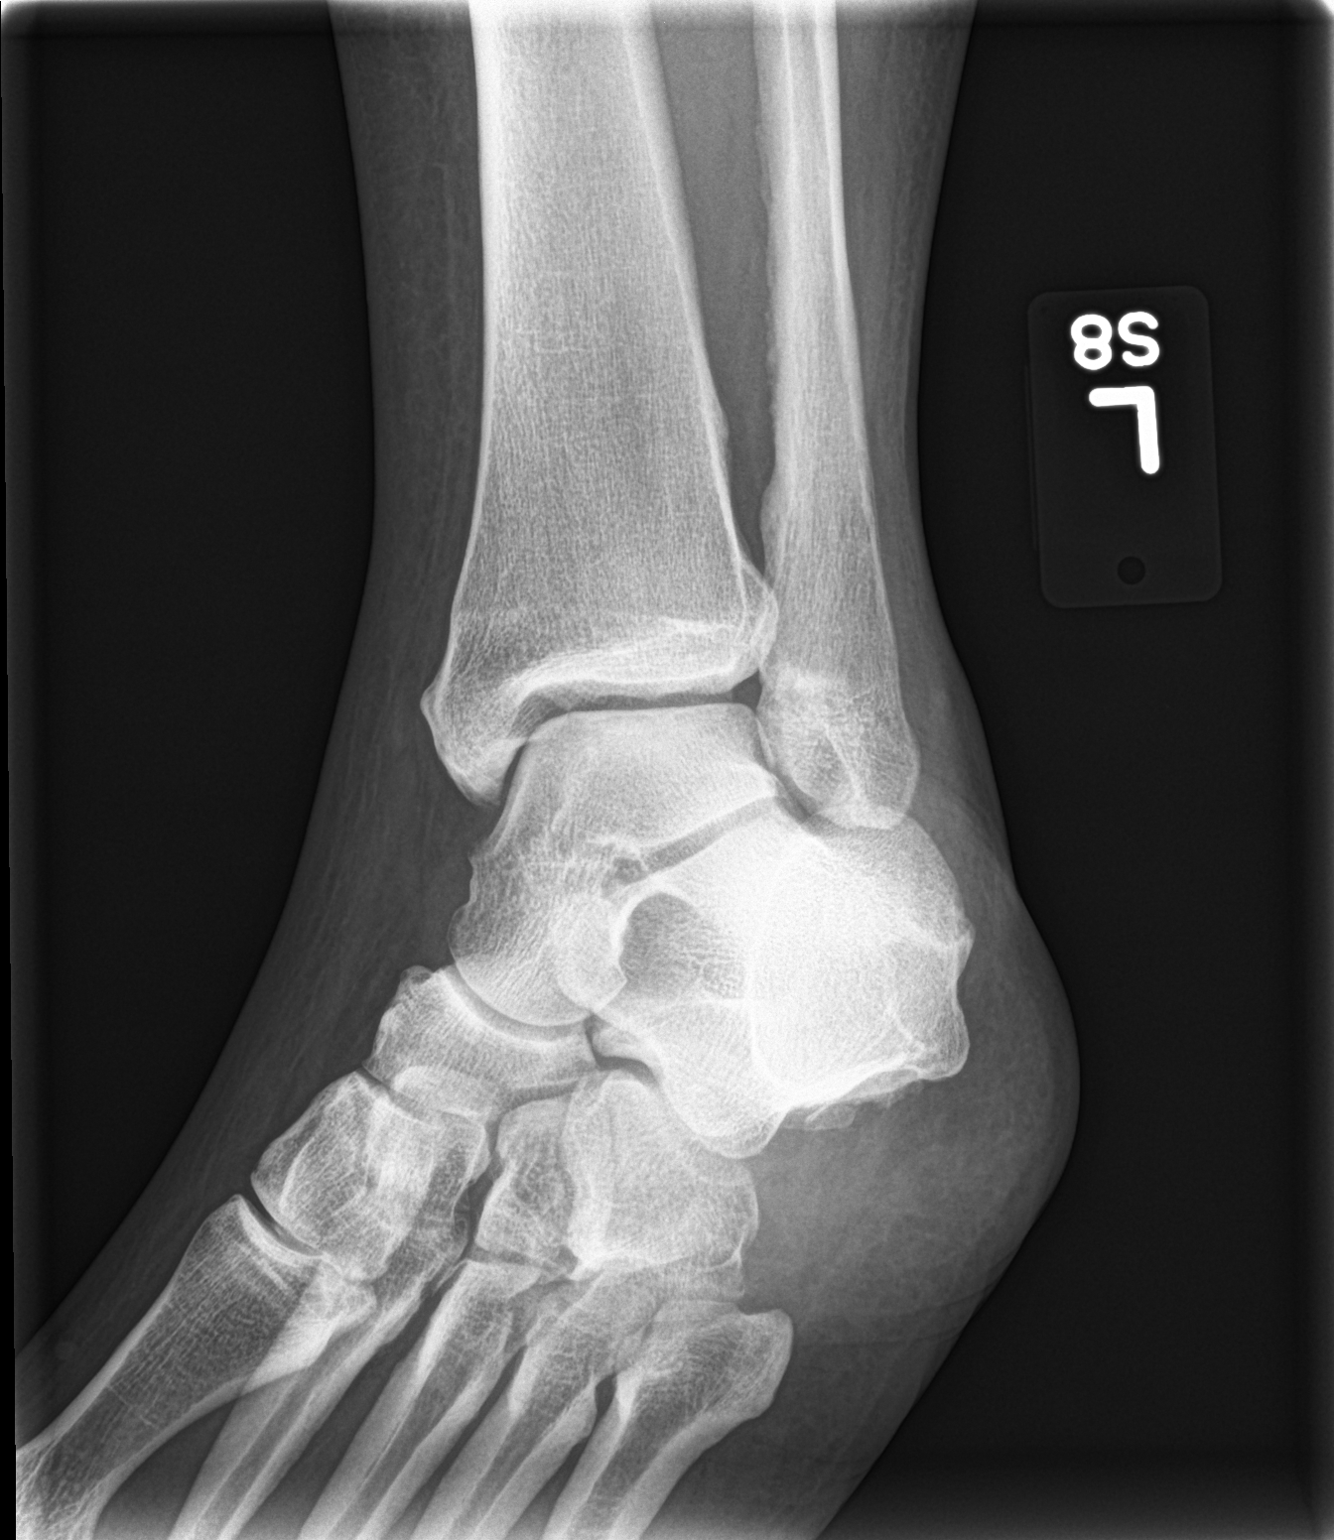

[ankle lat]
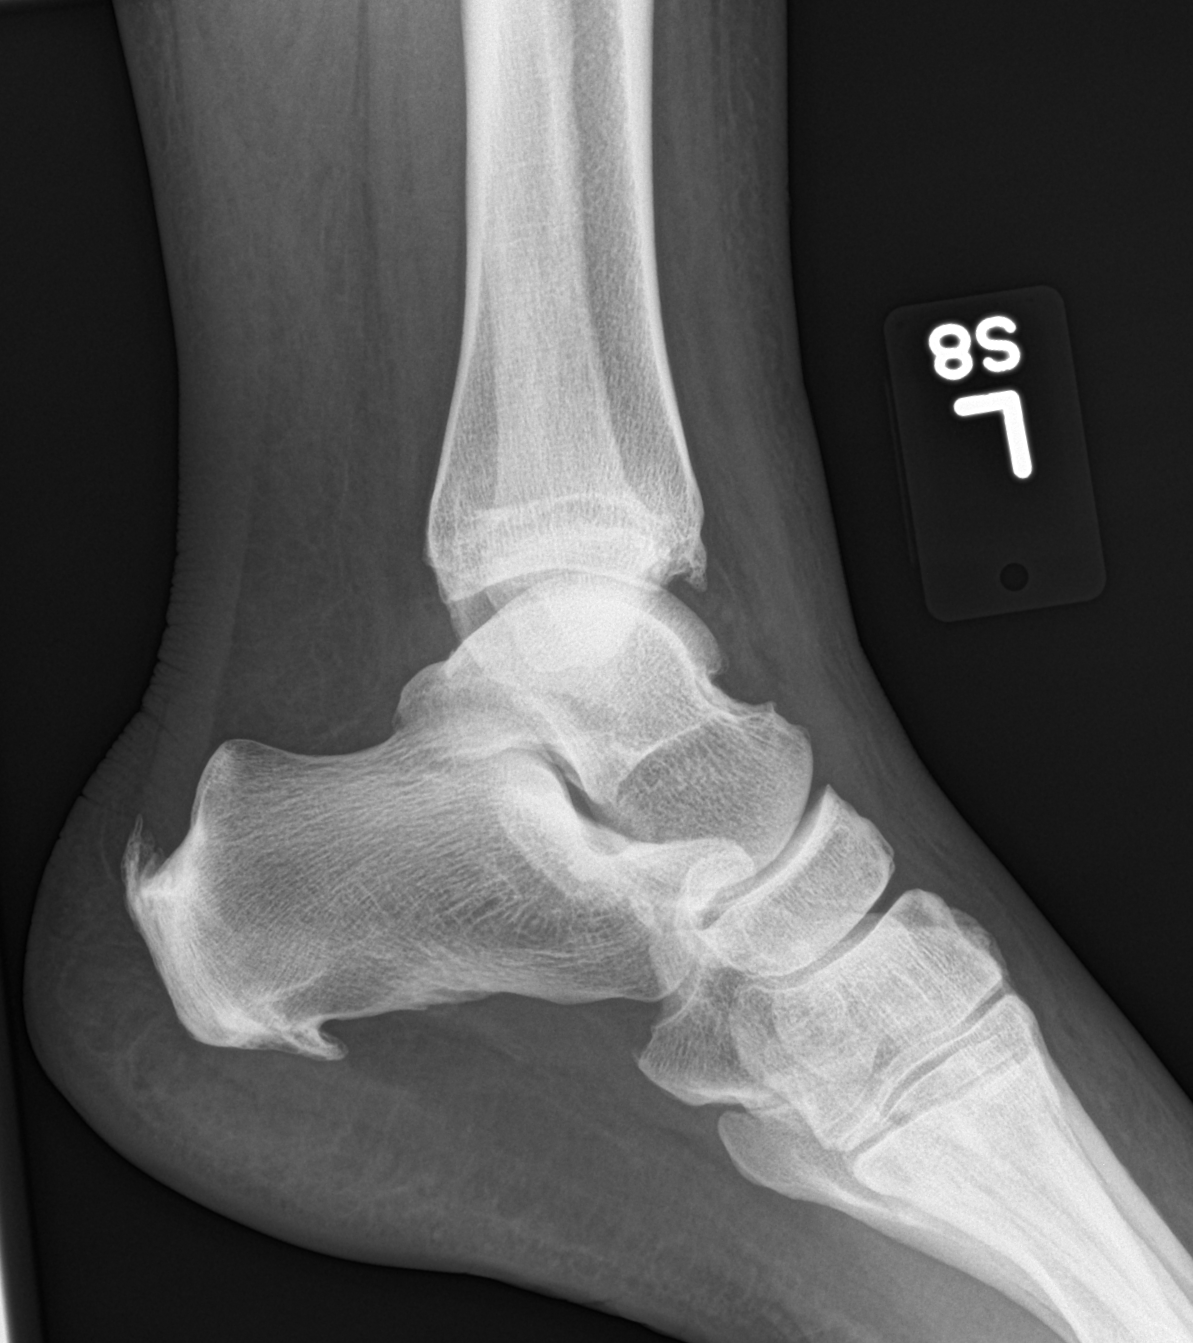

[3 of 3 positions shown; findings below may reference images not displayed]

FINDINGS: Small avulsion fractures noted from the inferior margin of the
medial malleolus.

No other fracture.  Ankle mortise is normally spaced and aligned.

There are plantar and dorsal calcaneal spurs.

Mild diffuse soft tissue edema is noted.
IMPRESSION: Small avulsion fracture from the inferior margin of the medial
malleolus. No other fractures. Normally aligned ankle joint.

## 2015-12-08 ENCOUNTER — Ambulatory Visit: Payer: BLUE CROSS/BLUE SHIELD | Admitting: Internal Medicine

## 2015-12-08 DIAGNOSIS — Z0289 Encounter for other administrative examinations: Secondary | ICD-10-CM

## 2015-12-09 ENCOUNTER — Other Ambulatory Visit: Payer: Self-pay | Admitting: Adult Health

## 2015-12-09 DIAGNOSIS — K760 Fatty (change of) liver, not elsewhere classified: Secondary | ICD-10-CM

## 2015-12-16 ENCOUNTER — Ambulatory Visit
Admission: RE | Admit: 2015-12-16 | Discharge: 2015-12-16 | Disposition: A | Discharge status: Home / Self Care | Payer: BLUE CROSS/BLUE SHIELD | Source: Ambulatory Visit | Attending: Adult Health | Admitting: Adult Health

## 2015-12-16 DIAGNOSIS — K760 Fatty (change of) liver, not elsewhere classified: Secondary | ICD-10-CM

## 2015-12-16 DIAGNOSIS — E119 Type 2 diabetes mellitus without complications: Secondary | ICD-10-CM | POA: Diagnosis not present

## 2016-01-05 DIAGNOSIS — Z6841 Body Mass Index (BMI) 40.0 and over, adult: Secondary | ICD-10-CM | POA: Diagnosis not present

## 2016-01-05 DIAGNOSIS — E1165 Type 2 diabetes mellitus with hyperglycemia: Secondary | ICD-10-CM | POA: Diagnosis not present

## 2016-01-05 DIAGNOSIS — E1140 Type 2 diabetes mellitus with diabetic neuropathy, unspecified: Secondary | ICD-10-CM | POA: Diagnosis not present

## 2016-01-05 DIAGNOSIS — I10 Essential (primary) hypertension: Secondary | ICD-10-CM | POA: Diagnosis not present

## 2016-01-08 DIAGNOSIS — F3131 Bipolar disorder, current episode depressed, mild: Secondary | ICD-10-CM | POA: Diagnosis not present

## 2016-01-13 ENCOUNTER — Telehealth: Payer: Self-pay | Admitting: Family Medicine

## 2016-01-13 NOTE — Telephone Encounter (Signed)
Talked to patient.  Will have patient come in for COPD/ Ambulatory vitals so that we can fill out paperwork and send to [place] for O2.  Of note, she desats during sleep not with ambulation.  Isidor Bromell M. Lajuana Ripple, DO PGY-3, [facility]

## 2016-01-20 ENCOUNTER — Ambulatory Visit: Payer: BLUE CROSS/BLUE SHIELD | Admitting: Family Medicine

## 2016-02-02 DIAGNOSIS — I10 Essential (primary) hypertension: Secondary | ICD-10-CM | POA: Diagnosis not present

## 2016-02-02 DIAGNOSIS — E1165 Type 2 diabetes mellitus with hyperglycemia: Secondary | ICD-10-CM | POA: Diagnosis not present

## 2016-02-02 DIAGNOSIS — E1140 Type 2 diabetes mellitus with diabetic neuropathy, unspecified: Secondary | ICD-10-CM | POA: Diagnosis not present

## 2016-02-02 DIAGNOSIS — J449 Chronic obstructive pulmonary disease, unspecified: Secondary | ICD-10-CM | POA: Diagnosis not present

## 2016-02-02 DIAGNOSIS — G2581 Restless legs syndrome: Secondary | ICD-10-CM | POA: Diagnosis not present

## 2016-02-02 DIAGNOSIS — L282 Other prurigo: Secondary | ICD-10-CM | POA: Diagnosis not present

## 2016-02-02 DIAGNOSIS — Z0289 Encounter for other administrative examinations: Secondary | ICD-10-CM | POA: Diagnosis not present

## 2016-02-09 ENCOUNTER — Encounter: Payer: Self-pay | Admitting: Family Medicine

## 2016-02-09 ENCOUNTER — Other Ambulatory Visit: Payer: Self-pay | Admitting: Family Medicine

## 2016-02-09 ENCOUNTER — Ambulatory Visit (INDEPENDENT_AMBULATORY_CARE_PROVIDER_SITE_OTHER): Payer: Medicare Other | Admitting: Family Medicine

## 2016-02-09 VITALS — BP 147/72 | HR 106 | Temp 98.1°F | Wt 266.6 lb

## 2016-02-09 DIAGNOSIS — J449 Chronic obstructive pulmonary disease, unspecified: Secondary | ICD-10-CM | POA: Diagnosis not present

## 2016-02-09 DIAGNOSIS — R0600 Dyspnea, unspecified: Secondary | ICD-10-CM

## 2016-02-09 DIAGNOSIS — F17200 Nicotine dependence, unspecified, uncomplicated: Secondary | ICD-10-CM | POA: Diagnosis not present

## 2016-02-09 MED ORDER — LISINOPRIL 10 MG PO TABS: 10.0000 mg | ORAL_TABLET | Freq: Every day | ORAL | 3 refills | Status: DC

## 2016-02-09 NOTE — Progress Notes (Signed)
    Subjective: CC: oxygen  HPI: [name] is a 45 y.o. female presenting to clinic today for office visit. Concerns today include:  1. Oxygen needs Patient has used O2 at night time for nocturnal desaturation since 2009.  She does not desaturate during the day and does not require O2 during the day.  Does endorse sensation of SOB with ambulation.  She has a h/o CHF and is followed by cardiology for this.  She also has COPD for which she is on several inhalers.  Reports compliance with Advair, Spiriva, Albuterol.  Last PFTs 2015: GOLD Classification B based on CAT score of 35 and FEV1 > 50%.  Smoking 1/2 ppd of Marlboro Lights daily.  Patient does not wish to stop smoking.  Had sleep study done 02/2014 which showed minimal sleep apnea not requiring CPAP but O2 was recommended.  Social History Reviewed: active smoker. FamHx and MedHx reviewed.  Please see EMR. Health Maintenance: pap smear, foot exam  ROS: Per HPI  Objective: Office vital signs reviewed. BP (!) 147/72 (BP Location: Left Arm, Patient Position: Sitting, Cuff Size: Large)   Pulse (!) 106   Temp 98.1 F (36.7 C) (Oral)   Wt 266 lb 9.6 oz (120.9 kg)   LMP 11/11/2011   SpO2 93%   BMI 43.03 kg/m   Physical Examination:  General: Awake, alert, overweight, No acute distress, room smells of tobacco HEENT: Normal, MMM Cardio: regular rate and rhythm, S1S2 heard, no murmurs appreciated Pulm: global expiratory wheeze with prolonged expiratory phase. no rhonchi or rales, breathing normally on room air.  Assessment/ Plan: 45 y.o. female   1. Chronic obstructive pulmonary disease, unspecified COPD type ([place]).   - Continue current inhalers - Will consider formal PFTs with pulmonology, as patient had used inhalers before PFTs with Dr Valentina Lucks in 2015. - Smoking cessation encouraged  2. TOBACCO DEPENDENCE - Precontemplative - Again, discussed risks of continued tobacco use including cancer, worsening COPD and death  3.  Dyspnea, on exertion and desaturations at night. - Chronic need for nocturnal O2 - No desaturations on today's exam - Forms for O2 filled and faxed today.  Copy given to patient.  Follow up in 3-4 weeks for annual exam with pap smear.   Janora Norlander, DO PGY-3, [facility]

## 2016-02-17 DIAGNOSIS — J449 Chronic obstructive pulmonary disease, unspecified: Secondary | ICD-10-CM | POA: Diagnosis not present

## 2016-02-25 ENCOUNTER — Ambulatory Visit (INDEPENDENT_AMBULATORY_CARE_PROVIDER_SITE_OTHER): Payer: BLUE CROSS/BLUE SHIELD | Admitting: Cardiology

## 2016-02-25 ENCOUNTER — Encounter: Payer: Self-pay | Admitting: Cardiology

## 2016-02-25 VITALS — BP 142/64 | HR 105 | Ht 64.0 in | Wt 264.0 lb

## 2016-02-25 DIAGNOSIS — R002 Palpitations: Secondary | ICD-10-CM

## 2016-02-25 DIAGNOSIS — R000 Tachycardia, unspecified: Secondary | ICD-10-CM

## 2016-02-25 LAB — CBC
HCT: 44 % (ref 35.0–45.0)
Hemoglobin: 14.6 g/dL (ref 11.7–15.5)
MCH: 29.6 pg (ref 27.0–33.0)
MCHC: 33.2 g/dL (ref 32.0–36.0)
MCV: 89.1 fL (ref 80.0–100.0)
MPV: 11.4 fL (ref 7.5–12.5)
Platelets: 279 10*3/uL (ref 140–400)
RBC: 4.94 MIL/uL (ref 3.80–5.10)
RDW: 14.7 % (ref 11.0–15.0)
WBC: 15.2 10*3/uL — ABNORMAL HIGH (ref 3.8–10.8)

## 2016-02-25 LAB — TSH: TSH: 0.56 mIU/L

## 2016-02-25 LAB — BASIC METABOLIC PANEL
BUN: 17 mg/dL (ref 7–25)
CO2: 22 mmol/L (ref 20–31)
Calcium: 9.8 mg/dL (ref 8.6–10.2)
Chloride: 98 mmol/L (ref 98–110)
Creat: 0.94 mg/dL (ref 0.50–1.10)
Glucose, Bld: 355 mg/dL — ABNORMAL HIGH (ref 65–99)
Potassium: 4.8 mmol/L (ref 3.5–5.3)
Sodium: 132 mmol/L — ABNORMAL LOW (ref 135–146)

## 2016-02-25 NOTE — Progress Notes (Signed)
02/25/2016 [name]   [birth year]  [id number]  Primary Physician Nancy Chou, NP Primary Cardiologist: [name]   Reason for Visit/CC:  Elevated HR, palpitations   HPI:  45 y.o. female  who has had mild to moderate coronary artery disease in the past. She was evaluated in 2015 with a cardiac cath. Her most recent echocardiogram was May 2017. Left ventricular ejection fraction was normal at 65-70%. Grade 2 diastolic dysfunction was noted. She has been followed by Dr. Irish Foster. Her last office visit was him was in June 2016 for preoperative clearance for hernia surgery. She was cleared by him at that time to undergo surgery. Additional past medical history is also notable for tobacco abuse and COPD. She uses nocturnal oxygen nightly.  She was recently seen by her PCP in clinic and was noted to have an elevated heart rate of 106 bpm. Patient was advised to follow-up in our clinic for further evaluation. EKG today demonstrates sinus tachycardia with a heart rate of 105 bpm. She also has a right bundle branch block, seen on previous EKGs as well.   In clinic she reports issues with tachy palpitations that occur at rest. Worse with exertion. She notes that her heart rate can get as high as the 120s. She states "I see stars". She feels lightheaded and dizzy when this occurs. She has chronic dyspnea from her COPD however she reports that her breathing is more labored when she has palpitations. She also has chest tightness with her palpitations but denies any chest pain symptoms in the absence of palpitations. These symptoms occur daily. She denies any recent fever, chills, nausea, vomiting or diarrhea. She reports that she has been fully compliant with her Metroprolol. She is on a low dose, 12.5 mg. Unfortunately, she continues to smoke.     Current Outpatient Prescriptions  Medication Sig Dispense Refill  . aspirin EC 81 MG tablet Take 81 mg by mouth every morning.    . busPIRone (BUSPAR) 15 MG  tablet Take 15 mg by mouth 2 (two) times daily. [name]    . clonazePAM (KLONOPIN) 1 MG tablet Take 1 mg by mouth 3 (three) times daily.     . DULoxetine (CYMBALTA) 60 MG capsule Take 60 mg by mouth daily.    . famotidine (PEPCID) 20 MG tablet Take 20 mg by mouth 2 (two) times daily.    Marland Kitchen glimepiride (AMARYL) 4 MG tablet Take 4 mg by mouth 2 (two) times daily. Dr [name]    . ibuprofen (ADVIL,MOTRIN) 200 MG tablet Take 600 mg by mouth 2 (two) times daily as needed.    . insulin aspart (NOVOLOG) 100 UNIT/ML injection Inject 20 Units into the skin 2 (two) times daily. [name]    . insulin regular human CONCENTRATED (HUMULIN R) 500 UNIT/ML SOLN injection Inject 100-180 Units into the skin 2 (two) times daily with a meal. 100 units = 200, 180 units = 300 and above    . lamoTRIgine (LAMICTAL) 200 MG tablet Take 200 mg by mouth at bedtime.    Marland Kitchen lisinopril (PRINIVIL,ZESTRIL) 10 MG tablet Take 1 tablet (10 mg total) by mouth at bedtime. 90 tablet 3  . loratadine (CLARITIN) 10 MG tablet Take 1 tablet (10 mg total) by mouth daily. 30 tablet 11  . metoprolol succinate (TOPROL-XL) 25 MG 24 hr tablet Take 12.5 mg by mouth daily. Dr [name]    . OXYGEN Inhale 2 L into the lungs at bedtime.    . pregabalin (  LYRICA) 150 MG capsule Take 1 capsule (150 mg total) by mouth 2 (two) times daily. 60 capsule 5  . PROVENTIL HFA 108 (90 Base) MCG/ACT inhaler INHALE TWO (2) PUFFS EVERY FOUR HOURS ASNEEDED FOR SHORTNESS OF BREATH 6.7 g 2  . QUEtiapine (SEROQUEL XR) 400 MG 24 hr tablet Take 800 mg by mouth at bedtime.    Marland Kitchen Respiratory Therapy Supplies (PARI SOFT PLASTIC ADULT MASK) MISC Use with oxygen, To be titrated by respiratory therapist 1 each 0   No current facility-administered medications for this visit.     Allergies  Allergen Reactions  . Metoprolol Shortness Of Breath    Occurrence of shortness of breath after 3 days  . Montelukast Sodium Shortness Of Breath    ER visit for swollen throat  .  Morphine Sulfate Shortness Of Breath and Nausea And Vomiting    Swollen Throat - Able to tolerate dilaudid  . Penicillins Hives and Shortness Of Breath    Swelling on throat Has patient had a PCN reaction causing immediate rash, facial/tongue/throat swelling, SOB or lightheadedness with hypotension: Yes Has patient had a PCN reaction causing severe rash involving mucus membranes or skin necrosis: No Has patient had a PCN reaction that required hospitalization Yes Has patient had a PCN reaction occurring within the last 10 years: No If all of the above answers are "NO", then may proceed with Cephalosporin use.   . Gabapentin Swelling    Social History   Social History  . Marital status: Married    Spouse name: N/A  . Number of children: N/A  . Years of education: N/A   Occupational History  . Not on file.   Social History Main Topics  . Smoking status: Current Every Day Smoker    Packs/day: 1.00    Years: 28.00    Types: Cigarettes  . Smokeless tobacco: Never Used  . Alcohol use No  . Drug use: No  . Sexual activity: Yes   Other Topics Concern  . Not on file   Social History Narrative   Lives in [place]   Married but lives with Boyfriend - not legally separated   Disabled - for BiPolar, Seizure disorder, diabetes   Formerly worked at [employer]           Review of Systems: General: negative for chills, fever, night sweats or weight changes.  Cardiovascular: negative for chest pain, dyspnea on exertion, edema, orthopnea, palpitations, paroxysmal nocturnal dyspnea or shortness of breath Dermatological: negative for rash Respiratory: negative for cough or wheezing Urologic: negative for hematuria Abdominal: negative for nausea, vomiting, diarrhea, bright red blood per rectum, melena, or hematemesis Neurologic: negative for visual changes, syncope, or dizziness All other systems reviewed and are otherwise negative except as noted above.    Blood pressure (!) 142/64,  pulse (!) 105, height 5\' 4"  (1.626 m), weight 264 lb (119.7 kg), last menstrual period 11/11/2011.  General appearance: alert, cooperative and no distress, obsese Neck: no carotid bruit and no JVD Lungs: clear to auscultation bilaterally Heart: regular rhythm, tachy rate, S1, S2 normal, no murmur, click, rub or gallop Extremities: no LEE Pulses: 2+ and symmetric Skin: warm and dry Neurologic: Grossly normal  EKG Sinus tachycardia. 104 bpm. Chronic RBBB  ASSESSMENT AND PLAN:   1. Palpitations: EKG shows sinus tachycardia with chronic right bundle branch block. Heartbeat 104 bpm. She also endorses symptoms of dizziness, lightheadedness, increased dyspnea and chest tightness in the presence of the patient's. She denies any chest pain in the  absence of palpitations. She is currently on low-dose Metroprolol, 12.5 mg twice a day. We will have the patient wear a 48-hour Holter monitor to assess the degree of her tachycardia and to rule out any serious cardiac rhythms. Will plan to have the patient increase her Metoprolol, based on findings. We will also check a TSH, CBC and CMP today. F/u with me in 1-2 weeks. Patient was also told that nicotine could be causing her elevated heart rate. Smoking cessation was advised.  PLAN  F/u in 1-2 weeks   Brittainy Simmons PA-C 02/25/2016 8:28 AM

## 2016-02-25 NOTE — Addendum Note (Signed)
Addended by: Zebedee Iba on: 02/25/2016 09:18 AM   Modules accepted: Orders

## 2016-02-25 NOTE — Patient Instructions (Addendum)
Medication Instructions:   Your physician recommends that you continue on your current medications as directed. Please refer to the Current Medication list given to you today.   If you need a refill on your cardiac medications before your next appointment, please call your pharmacy.  Labwork: CBC BMET AND TSH    Testing/Procedures: 20 HOUR Your physician has recommended that you wear a holter monitor. Holter monitors are medical devices that record the heart's electrical activity. Doctors most often use these monitors to diagnose arrhythmias. Arrhythmias are problems with the speed or rhythm of the heartbeat. The monitor is a small, portable device. You can wear one while you do your normal daily activities. This is usually used to diagnose what is causing palpitations/syncope (passing out).     Follow-Up: IN 1 T 2 WEEKS WITH SIMMONS OR AVAILABLE TEAM PA/NP   Any Other Special Instructions Will Be Listed Below (If Applicable).

## 2016-02-26 ENCOUNTER — Telehealth: Payer: Self-pay | Admitting: *Deleted

## 2016-02-26 NOTE — Telephone Encounter (Signed)
-----   Message from Consuelo Pandy, [place] sent at 02/25/2016  5:14 PM EDT ----- TSH and potassium levels are normal. Continue with plans for heart monitor.

## 2016-02-26 NOTE — Telephone Encounter (Signed)
SPOKE TO PATIENTS ABOUT LAB RESULTS AND VERBALIZED UNDERSTANDING

## 2016-03-01 NOTE — Addendum Note (Signed)
Addended by: Claude Manges on: 03/01/2016 09:55 AM   Modules accepted: Orders

## 2016-03-02 ENCOUNTER — Ambulatory Visit (INDEPENDENT_AMBULATORY_CARE_PROVIDER_SITE_OTHER): Payer: BLUE CROSS/BLUE SHIELD

## 2016-03-02 DIAGNOSIS — R000 Tachycardia, unspecified: Secondary | ICD-10-CM

## 2016-03-02 DIAGNOSIS — R002 Palpitations: Secondary | ICD-10-CM | POA: Diagnosis not present

## 2016-03-03 DIAGNOSIS — E1140 Type 2 diabetes mellitus with diabetic neuropathy, unspecified: Secondary | ICD-10-CM | POA: Diagnosis not present

## 2016-03-03 DIAGNOSIS — J449 Chronic obstructive pulmonary disease, unspecified: Secondary | ICD-10-CM | POA: Diagnosis not present

## 2016-03-03 DIAGNOSIS — E119 Type 2 diabetes mellitus without complications: Secondary | ICD-10-CM | POA: Diagnosis not present

## 2016-03-03 DIAGNOSIS — E1165 Type 2 diabetes mellitus with hyperglycemia: Secondary | ICD-10-CM | POA: Diagnosis not present

## 2016-03-03 DIAGNOSIS — I10 Essential (primary) hypertension: Secondary | ICD-10-CM | POA: Diagnosis not present

## 2016-03-09 ENCOUNTER — Encounter: Payer: Self-pay | Admitting: Physician Assistant

## 2016-03-09 ENCOUNTER — Ambulatory Visit (INDEPENDENT_AMBULATORY_CARE_PROVIDER_SITE_OTHER): Payer: BLUE CROSS/BLUE SHIELD | Admitting: Physician Assistant

## 2016-03-09 VITALS — BP 122/68 | HR 104 | Ht 64.0 in | Wt 264.0 lb

## 2016-03-09 DIAGNOSIS — R0602 Shortness of breath: Secondary | ICD-10-CM

## 2016-03-09 DIAGNOSIS — R000 Tachycardia, unspecified: Secondary | ICD-10-CM

## 2016-03-09 DIAGNOSIS — I10 Essential (primary) hypertension: Secondary | ICD-10-CM | POA: Diagnosis not present

## 2016-03-09 DIAGNOSIS — E785 Hyperlipidemia, unspecified: Secondary | ICD-10-CM

## 2016-03-09 DIAGNOSIS — IMO0002 Reserved for concepts with insufficient information to code with codable children: Secondary | ICD-10-CM

## 2016-03-09 DIAGNOSIS — E1165 Type 2 diabetes mellitus with hyperglycemia: Secondary | ICD-10-CM

## 2016-03-09 DIAGNOSIS — E118 Type 2 diabetes mellitus with unspecified complications: Secondary | ICD-10-CM | POA: Diagnosis not present

## 2016-03-09 DIAGNOSIS — Z794 Long term (current) use of insulin: Secondary | ICD-10-CM

## 2016-03-09 MED ORDER — DILTIAZEM HCL ER COATED BEADS 120 MG PO CP24: 120.0000 mg | ORAL_CAPSULE | Freq: Every day | ORAL | 3 refills | Status: DC

## 2016-03-09 NOTE — Patient Instructions (Addendum)
Medication Instructions:  Stop taking Toprol and start taking Diltiazem 120 mg daily  Labwork: None  Testing/Procedures: Your physician has requested that you have a dobutamine myoview. For furth information please visit [email or web address]. Please follow instruction sheet, as given.  Do not take Diltiazem on the morning of your stress test.   Follow-Up: In 4 to 6 weeks with Dayna Dunn or Lyda Jester  Any Other Special Instructions Will Be Listed Below (If Applicable). Please follow up with your primary care doctor concerning your elevated WBC on recent labs.  You must stop smoking or your symptoms will get worse.     If you need a refill on your cardiac medications before your next appointment, please call your pharmacy.

## 2016-03-09 NOTE — Progress Notes (Signed)
Cardiology Office Note    Date:  03/09/2016  ID:  [name], DOB 1971-03-02, MRN [id number] PCP:  Alvester Chou, NP  Cardiologist: Dr. Irish Lack 2015  Chief Complaint: f/u heart monitor  History of Present Illness:  [name] is a 45 y.o. female with history of mild-mod CAD 123456, diastlolic dysfunction, tobacco abuse, COPD on nocturnal O2, stroke age 51-19, seizures, peripheral neuropathy, HTN, hyperlipidemia, hypertriglyceridemia, anxiety, GERD who presents to f/u monitor. To recap hx, LHC 11/2013 done for CP/fluid retention: mild disease in prox LAD, mild-mod disease in mRCA, EF 60% with normal LVEDP. TEE 11/2013 (done for aortic valve calcification on 2D echo) showed small area of calcification on LCC, no veg, no AS/AI, normal LVF, question of small left to right flow by color but no right to left flow by bubble study. More recent echo 11/16/15 showed mild LVH, EF 65-70%, grade 2 DD, AV mildly thickened, mild-mod calcified with minimally restricted motion.  She recently saw Nancy Jester PA-C to evaluate elevated HR - TSH 0.56, WBC 15.2 (frequently elevated), Hgb 14.6, Na 132 (pseudohypoNa given glucose 355 - previous blood sugar before that was in the 500 range). 48 hour Holter showed average HR 107bpm, minimum 80, max 144, one 2.6 sec pause at 2:39am, no sig arrhythmias otherwise. Per review of Epic, her average HR has been 90s-120s (regularly >100) since 2009. Has been on Seroquel for 9 year which can cause tachycardia. She returns for follow-up today reporting continued exertional dyspnea - also has sensation of elevated HR while lifting weights. Occasional dizziness, LEE. No syncope. She has continued to smoke. + wheezing. Uses O2 QHS. She stopped her BB after her recent heart monitor.   Past Medical History:  Diagnosis Date  . Anxiety    takes Prozac daily  . Aortic valve calcification   . Asthma    Advair and Spirva daily  . CAD (coronary artery disease)    a. LHC 11/2013 done  for CP/fluid retention: mild disease in prox LAD, mild-mod disease in mRCA, EF 60% with normal LVEDP.  Marland Kitchen COPD (chronic obstructive pulmonary disease) ([facility])    a. nocturnal O2.  . Diabetes mellitus   . Diastolic dysfunction   . Family history of adverse reaction to anesthesia    mom gets nauseated  . GERD (gastroesophageal reflux disease)    takes Pepcid daily  . History of blood clots    left leg 3-61yrs ago  . Hyperlipidemia   . Hypertension   . Hypertriglyceridemia   . Inguinal hernia, left 01/2015  . Muscle spasm    takes Flexeril daily as needed  . Peripheral neuropathy ([facility])    takes Lyrica daily  . RBBB   . Seizures ([place])    takes Lamictal daily;last seizure over a yr ago  . Sinus tachycardia ([place])   . Smokers' cough ([employer])   . Stroke [facility]) 1989   left sided weakness    Past Surgical History:  Procedure Laterality Date  . INGUINAL HERNIA REPAIR Left 04/08/2015   Procedure: OPEN LEFT INGUINAL HERNIA REPAIR WITH MESH;  Surgeon: Ralene Ok, MD;  Location: [place];  Service: General;  Laterality: Left;  . INSERTION OF MESH Left 04/08/2015   Procedure: INSERTION OF MESH;  Surgeon: Ralene Ok, MD;  Location: [place];  Service: General;  Laterality: Left;  . LAPAROSCOPY     Endometriosis  . LEFT HEART CATHETERIZATION WITH CORONARY ANGIOGRAM N/A 12/05/2013   Procedure: LEFT HEART CATHETERIZATION WITH CORONARY ANGIOGRAM;  Surgeon: Charlann Lange  Irish Lack, MD;  [facility] C[facility] LAB;  Service: Cardiovascular;  Laterality: N/A;  . right kidney drained    . TEE WITHOUT CARDIOVERSION N/A 11/28/2013   Procedure: TRANSESOPHAGEAL ECHOCARDIOGRAM (TEE);  Surgeon: Thayer Headings, MD;  Location: [facility] ENDOSCOPY;  Service: Cardiovascular;  Laterality: N/A;    Current Medications: Current Outpatient Prescriptions  Medication Sig Dispense Refill  . aspirin EC 81 MG tablet Take 81 mg by mouth every morning.    . busPIRone (BUSPAR) 15 MG tablet Take 15 mg by mouth 2 (two) times daily. [name]    . clonazePAM (KLONOPIN) 1 MG tablet Take 1 mg by mouth 3 (three) times daily.     . DULoxetine (CYMBALTA) 60 MG capsule Take 60 mg by mouth daily.    . famotidine (PEPCID) 20 MG tablet Take 20 mg by mouth 2 (two) times daily.    Marland Kitchen glimepiride (AMARYL) 4 MG tablet Take 4 mg by mouth 2 (two) times daily. Dr Alvester Chou    . ibuprofen (ADVIL,MOTRIN) 200 MG tablet Take 600 mg by mouth 2 (two) times daily as needed.    . insulin aspart (NOVOLOG) 100 UNIT/ML injection Inject 20 Units into the skin 2 (two) times daily. Alvester Chou    . insulin regular human CONCENTRATED (HUMULIN R) 500 UNIT/ML SOLN injection Inject 100-180 Units into the skin 2 (two) times daily with a meal. 100 units = 200, 180 units = 300 and above    . lamoTRIgine (LAMICTAL) 200 MG tablet Take 200 mg by mouth at bedtime.    Marland Kitchen lisinopril (PRINIVIL,ZESTRIL) 10 MG tablet Take 1 tablet (10 mg total) by mouth at bedtime. 90 tablet 3  . loratadine (CLARITIN) 10 MG tablet Take 1 tablet (10 mg total) by mouth daily. 30 tablet 11  . metoprolol succinate (TOPROL-XL) 25 MG 24 hr tablet Take 12.5 mg by mouth daily. Dr Alvester Chou    . OXYGEN Inhale 2 L into the lungs at bedtime.    . pregabalin (LYRICA) 150 MG capsule Take 1 capsule (150 mg total) by mouth 2 (two) times daily. 60 capsule 5  . PROVENTIL HFA 108 (90 Base) MCG/ACT inhaler INHALE TWO (2) PUFFS EVERY FOUR HOURS ASNEEDED FOR SHORTNESS OF BREATH 6.7 g 2  . QUEtiapine (SEROQUEL XR) 400 MG 24 hr tablet Take 800 mg by mouth at bedtime.    Marland Kitchen Respiratory Therapy Supplies (PARI SOFT PLASTIC ADULT MASK) MISC Use with oxygen, To be titrated by respiratory therapist 1 each 0   No current facility-administered medications for this visit.      Allergies:   Metoprolol; Montelukast sodium; Morphine sulfate; Penicillins; and Gabapentin   Social History   Social History  . Marital status: Married    Spouse name: N/A  . Number of children: N/A  . Years of education: N/A   Social  History Main Topics  . Smoking status: Current Every Day Smoker    Packs/day: 1.00    Years: 28.00    Types: Cigarettes  . Smokeless tobacco: Never Used  . Alcohol use No  . Drug use: No  . Sexual activity: Yes   Other Topics Concern  . None   Social History Narrative   Lives in [place]   Married but lives with Boyfriend - not legally separated   Disabled - for BiPolar, Seizure disorder, diabetes   Formerly worked at [employer]           Family History:  The patient's family history includes Asthma in her  father and sister; Coronary artery disease in her mother; Diabetes in her father and mother; Diabetes type II in her brother; Hypertension in her mother; Other in her brother; Venous thrombosis in her brother.   ROS:   Please see the history of present illness.  All other systems are reviewed and otherwise negative.    PHYSICAL EXAM:   VS:  BP 122/68   Pulse (!) 104   Ht 5\' 4"  (1.626 m)   Wt 264 lb (119.7 kg)   LMP 11/11/2011   BMI 45.32 kg/m   BMI: Body mass index is 45.32 kg/m. GEN: Well nourished, well developed morbidly obese white female in no acute distress  HEENT: normocephalic, atraumatic Neck: no JVD, carotid bruits, or masses Cardiac:RRR; no murmurs, rubs, or gallops, trace ankle edema  Respiratory:  Diffuse wheezing bilaterally, mod air movement, no rales or rhonchi, normal work of breathing GI: soft, nontender, nondistended, + BS MS: no deformity or atrophy  Skin: warm and dry, no rash Neuro:  Alert and Oriented x 3, Strength and sensation are intact, follows commands Psych: euthymic mood, full affect  Wt Readings from Last 3 Encounters:  03/09/16 264 lb (119.7 kg)  02/25/16 264 lb (119.7 kg)  02/09/16 266 lb 9.6 oz (120.9 kg)      Studies/Labs Reviewed:   EKG:  EKG was not ordered today.  Recent Labs: 11/03/2015: ALT 86; B Natriuretic Peptide 20.2 02/25/2016: BUN 17; Creat 0.94; Hemoglobin 14.6; Platelets 279; Potassium 4.8; Sodium 132; TSH 0.56     Lipid Panel    Component Value Date/Time   CHOL 166 02/04/2015 1056   TRIG 352 (H) 02/04/2015 1056   HDL 37 (L) 02/04/2015 1056   CHOLHDL 4.5 02/04/2015 1056   VLDL 70 (H) 02/04/2015 1056   LDLCALC 59 02/04/2015 1056   LDLDIRECT 145 (H) 04/30/2009 2013    Additional studies/ records that were reviewed today include: Summarized above.    ASSESSMENT & PLAN:   1. Chronic sinus tachycardia - appears persistent since 2009. TSH wnl. Suspect related to physiologic stress of severely uncontrolled diabetes mellitus, deconditioning, and COPD with ongoing tobacco abuse. She is also on Seroquel which can cause tachycardia. I have asked her to discuss this with her psychiatrist. She has self-discontinued Toprol. I do not want to restart another beta blocker due to her wheezing on exam. Will try Diltiazem (long-acting) 120mg  daily. 2. Dyspnea - suspect multifactorial - suspect major contribution from severe COPD/continued tobacco abuse. However, her severely uncontrolled diabetes and ongoing tobacco abuse put her at risk for worsening coronary artery disease (had mild-mod disease in 2015). Will proceed with dobutamine myoview. Given her significant wheezing on exam, I am worried she would have acute bronchoconstriction with Lexiscan. She cannot use the treadmill due to history of seizure disorder. Weight stable in 2017 - minimal edema on exam, no rales, normal LVEDP in the past for similar sx - would consider empiric diuresis if nuc unrevealing although BNP in 10/2015 was normal at 20. We discussed the fact that SOB will continue to worsen if she continues to smoke. 3. Essential HTN - follow BP with med change. 4. Uncontrolled diabetes mellitus - recent CBGs 300-500 range. Continue follow up with primary care recommended. 5. Leukocytosis - would advise follow-up of primary care. 6. Hyperlipidemia - followed by primary care.  Disposition: F/u with myself or Brittainy Simmons PA-C in 4-6  weeks.   Medication Adjustments/Labs and Tests Ordered: Current medicines are reviewed at length with the patient today.  Concerns regarding medicines are outlined above. Medication changes, Labs and Tests ordered today are summarized above and listed in the Patient Instructions accessible in Encounters.   Raechel Ache PA-C  03/09/2016 3:06 PM    [facility] [address], [place], [place]  [address] Phone: [phone]; Fax: [phone]

## 2016-03-17 DIAGNOSIS — I10 Essential (primary) hypertension: Secondary | ICD-10-CM | POA: Diagnosis not present

## 2016-03-17 DIAGNOSIS — I509 Heart failure, unspecified: Secondary | ICD-10-CM | POA: Diagnosis not present

## 2016-03-17 DIAGNOSIS — J449 Chronic obstructive pulmonary disease, unspecified: Secondary | ICD-10-CM | POA: Diagnosis not present

## 2016-03-17 DIAGNOSIS — E1165 Type 2 diabetes mellitus with hyperglycemia: Secondary | ICD-10-CM | POA: Diagnosis not present

## 2016-03-18 DIAGNOSIS — J449 Chronic obstructive pulmonary disease, unspecified: Secondary | ICD-10-CM | POA: Diagnosis not present

## 2016-03-18 DIAGNOSIS — I10 Essential (primary) hypertension: Secondary | ICD-10-CM | POA: Diagnosis not present

## 2016-03-18 DIAGNOSIS — R05 Cough: Secondary | ICD-10-CM | POA: Diagnosis not present

## 2016-03-18 DIAGNOSIS — R0602 Shortness of breath: Secondary | ICD-10-CM | POA: Diagnosis not present

## 2016-03-24 ENCOUNTER — Telehealth (HOSPITAL_COMMUNITY): Payer: Self-pay | Admitting: *Deleted

## 2016-03-24 NOTE — Telephone Encounter (Signed)
Patient given detailed instructions per Myocardial Perfusion Study Information Sheet for the test on 03/29/16 Patient notified to arrive 15 minutes early and that it is imperative to arrive on time for appointment to keep from having the test rescheduled.  If you need to cancel or reschedule your appointment, please call the office within 24 hours of your appointment. Failure to do so may result in a cancellation of your appointment, and a $50 no show fee. Patient verbalized understanding. Shondrea Steinert Jacqueline, RN  

## 2016-03-29 ENCOUNTER — Ambulatory Visit (HOSPITAL_COMMUNITY): Payer: BLUE CROSS/BLUE SHIELD | Attending: Cardiology

## 2016-03-29 DIAGNOSIS — R0602 Shortness of breath: Secondary | ICD-10-CM | POA: Diagnosis not present

## 2016-03-29 DIAGNOSIS — I2510 Atherosclerotic heart disease of native coronary artery without angina pectoris: Secondary | ICD-10-CM | POA: Diagnosis not present

## 2016-03-29 DIAGNOSIS — I10 Essential (primary) hypertension: Secondary | ICD-10-CM | POA: Diagnosis not present

## 2016-03-29 MED ORDER — TECHNETIUM TC 99M TETROFOSMIN IV KIT
32.9000 | PACK | Freq: Once | INTRAVENOUS | Status: AC | PRN
  Administered 2016-03-29: 32.9 via INTRAVENOUS
  Filled 2016-03-29: qty 33

## 2016-03-29 MED ORDER — REGADENOSON 0.4 MG/5ML IV SOLN
0.4000 mg | Freq: Once | INTRAVENOUS | Status: AC
  Administered 2016-03-29: 0.4 mg via INTRAVENOUS

## 2016-03-30 ENCOUNTER — Ambulatory Visit (HOSPITAL_COMMUNITY): Payer: BLUE CROSS/BLUE SHIELD | Attending: Physician Assistant

## 2016-03-30 LAB — MYOCARDIAL PERFUSION IMAGING
LV dias vol: 103 mL (ref 46–106)
LV sys vol: 38 mL
Peak HR: 117 {beats}/min
RATE: 0.22
Rest HR: 103 {beats}/min
SDS: 4
SRS: 4
SSS: 8
TID: 0.9

## 2016-03-30 MED ORDER — TECHNETIUM TC 99M TETROFOSMIN IV KIT
32.7000 | PACK | Freq: Once | INTRAVENOUS | Status: AC | PRN
  Administered 2016-03-30: 32.7 via INTRAVENOUS
  Filled 2016-03-30: qty 33

## 2016-04-01 ENCOUNTER — Telehealth: Payer: Self-pay | Admitting: *Deleted

## 2016-04-01 ENCOUNTER — Encounter: Payer: Self-pay | Admitting: Cardiology

## 2016-04-01 NOTE — Telephone Encounter (Signed)
Pt has been notified of monitor results by phone . Pt asked if she was supposed to start the new medications that Dr. Irish Lack wants her to. I checked the last ov note from 03/09/16 with Melina Copa, PA which Toprol has been stopped and to start Diltiazem 120 mg daily. Pt states needs Rx called in. I stated Rx was called in on 03/09/16 when she saw Melina Copa, PA. I advised pt to start Diltiazem today if possible. Pt said ok and thank you.

## 2016-04-05 DIAGNOSIS — F3131 Bipolar disorder, current episode depressed, mild: Secondary | ICD-10-CM | POA: Diagnosis not present

## 2016-04-07 DIAGNOSIS — J209 Acute bronchitis, unspecified: Secondary | ICD-10-CM | POA: Diagnosis not present

## 2016-04-07 DIAGNOSIS — E1140 Type 2 diabetes mellitus with diabetic neuropathy, unspecified: Secondary | ICD-10-CM | POA: Diagnosis not present

## 2016-04-07 DIAGNOSIS — F17200 Nicotine dependence, unspecified, uncomplicated: Secondary | ICD-10-CM | POA: Diagnosis not present

## 2016-04-07 DIAGNOSIS — J449 Chronic obstructive pulmonary disease, unspecified: Secondary | ICD-10-CM | POA: Diagnosis not present

## 2016-04-18 ENCOUNTER — Other Ambulatory Visit: Payer: Self-pay | Admitting: Family Medicine

## 2016-04-18 DIAGNOSIS — J449 Chronic obstructive pulmonary disease, unspecified: Secondary | ICD-10-CM | POA: Diagnosis not present

## 2016-04-18 DIAGNOSIS — I10 Essential (primary) hypertension: Secondary | ICD-10-CM | POA: Diagnosis not present

## 2016-04-18 DIAGNOSIS — E1165 Type 2 diabetes mellitus with hyperglycemia: Secondary | ICD-10-CM | POA: Diagnosis not present

## 2016-04-18 DIAGNOSIS — J209 Acute bronchitis, unspecified: Secondary | ICD-10-CM | POA: Diagnosis not present

## 2016-04-20 ENCOUNTER — Ambulatory Visit (INDEPENDENT_AMBULATORY_CARE_PROVIDER_SITE_OTHER): Payer: BLUE CROSS/BLUE SHIELD | Admitting: Cardiology

## 2016-04-20 ENCOUNTER — Encounter: Payer: Self-pay | Admitting: Cardiology

## 2016-04-20 VITALS — BP 130/58 | HR 104 | Ht 65.0 in | Wt 266.0 lb

## 2016-04-20 DIAGNOSIS — R002 Palpitations: Secondary | ICD-10-CM

## 2016-04-20 DIAGNOSIS — R000 Tachycardia, unspecified: Secondary | ICD-10-CM | POA: Diagnosis not present

## 2016-04-20 MED ORDER — DILTIAZEM HCL ER COATED BEADS 240 MG PO CP24: 240.0000 mg | ORAL_CAPSULE | Freq: Every day | ORAL | 3 refills | Status: DC

## 2016-04-20 NOTE — Progress Notes (Signed)
04/20/2016 [name]   09-19-1970  [id number]  Primary Physician Alvester Chou, NP Primary Cardiologist: Dr. Irish Lack   Reason for Visit/CC: f/u for palpitations   HPI:  45 y.o.femalewho has had mild to moderate coronary artery disease in the past. She was evaluated in 2015 with a cardiac cath. Her most recent echocardiogram was May 2017. Left ventricular ejection fraction was normal at 65-70%. Grade 2 diastolic dysfunction was noted. She has been followed by Dr. Irish Lack. Her last office visit was him was in June 2016 for preoperative clearance for hernia surgery. She was cleared by him at that time to undergo surgery. Additional past medical history is also notable for tobacco abuse and COPD. She uses nocturnal oxygen nightly.  She was recently seen by her PCP in clinic and was noted to have an elevated heart rate of 106 bpm. Patient was advised to follow-up in our clinic for further evaluation. I saw her on 02/25/16. EKG demonstrated sinus tachycardia with a heart rate of 105 bpm. She also had a right bundle branch block, seen on previous EKGs as well.   At that time, she endorsed tachy palpitations that occur at rest. Worse with exertion. She noted that her heart rate can get as high as the 120s. She states "I see stars" when this happens. She feels lightheaded and dizzy when this occurs. She has chronic dyspnea from her COPD however she reports that her breathing is more labored when she has palpitations. She also had chest tightness with her palpitations but denied any chest pain symptoms in the absence of palpitations. Her symptoms were occuring daily. She denied any recent fever, chills, nausea, vomiting or diarrhea. At the time, she was on low dose metoprolol XL.   I ordered for her to wear a 48 hr holter monitor. I also ordered basic labs. TSH was normal. CBC was negative for anemia. K was WNL at 4.8. Monitor showed no serious arrhthymias. Occasional PVCs were noted. Highest HR was  above 150 bpm. She saw Melina Copa for f/u.  She was instructed to stop Toprol and started on Diltiazem 120 mg daily. A stress test was also ordered. This was performed on 03/29/16. This was negative for ischemia.   Today in f/u, her resting HR is 104 bpm. She continues to smoke. She drinks a moderate amount of caffeine daily. At least 1 cup of coffee and a can of mountain dew daily. She uses albuterol at least 2 x a day for her COPD.   Current Meds  Medication Sig  . aspirin EC 81 MG tablet Take 81 mg by mouth every morning.  . busPIRone (BUSPAR) 15 MG tablet Take 15 mg by mouth 2 (two) times daily. [name]  . clonazePAM (KLONOPIN) 1 MG tablet Take 1 mg by mouth 2 (two) times daily.   [name] diltiazem (CARDIZEM CD) 240 MG 24 hr capsule Take 1 capsule (240 mg total) by mouth daily. To replace Toprol  . DULoxetine (CYMBALTA) 60 MG capsule Take 60 mg by mouth daily.  . famotidine (PEPCID) 20 MG tablet Take 20 mg by mouth 2 (two) times daily.  [name] FLUoxetine (PROZAC) 20 MG capsule Take 40 mg by mouth daily.  [name] glimepiride (AMARYL) 4 MG tablet Take 4 mg by mouth 2 (two) times daily. Dr Alvester Chou  . ibuprofen (ADVIL,MOTRIN) 200 MG tablet Take 600 mg by mouth 2 (two) times daily as needed.  . insulin aspart (NOVOLOG) 100 UNIT/ML injection Inject 20 Units into the skin 2 (  two) times daily. Alvester Chou  . insulin regular human CONCENTRATED (HUMULIN R) 500 UNIT/ML SOLN injection Inject 100-180 Units into the skin 2 (two) times daily with a meal. 100 units = 200, 180 units = 300 and above  . lamoTRIgine (LAMICTAL) 200 MG tablet Take 200 mg by mouth at bedtime.  [name] lisinopril (PRINIVIL,ZESTRIL) 10 MG tablet Take 1 tablet (10 mg total) by mouth at bedtime.  [name] loratadine (CLARITIN) 10 MG tablet Take 1 tablet (10 mg total) by mouth daily.  . OXYGEN Inhale 2 L into the lungs at bedtime.  . pregabalin (LYRICA) 150 MG capsule Take 1 capsule (150 mg total) by mouth 2 (two) times daily.  [name] PROVENTIL HFA 108 (90 Base)  MCG/ACT inhaler INHALE TWO (2) PUFFS EVERY FOUR HOURS ASNEEDED FOR SHORTNESS OF BREATH  . QUEtiapine (SEROQUEL XR) 400 MG 24 hr tablet Take 800 mg by mouth at bedtime.  [name] SPIRIVA RESPIMAT 2.5 MCG/ACT AERS INHALE 2 PUFFS INTO THE LUNGS DAILY.  . [DISCONTINUED] diltiazem (CARDIZEM CD) 120 MG 24 hr capsule Take 1 capsule (120 mg total) by mouth daily. To replace Toprol   Allergies  Allergen Reactions  . Metoprolol Shortness Of Breath    Occurrence of shortness of breath after 3 days  . Montelukast Sodium Shortness Of Breath    ER visit for swollen throat  . Morphine Sulfate Shortness Of Breath and Nausea And Vomiting    Swollen Throat - Able to tolerate dilaudid  . Penicillins Hives and Shortness Of Breath    Swelling on throat Has patient had a PCN reaction causing immediate rash, facial/tongue/throat swelling, SOB or lightheadedness with hypotension: Yes Has patient had a PCN reaction causing severe rash involving mucus membranes or skin necrosis: No Has patient had a PCN reaction that required hospitalization Yes Has patient had a PCN reaction occurring within the last 10 years: No If all of the above answers are "NO", then may proceed with Cephalosporin use.   . Gabapentin Swelling   Past Medical History:  Diagnosis Date  . Anxiety    takes Prozac daily  . Aortic valve calcification   . Asthma    Advair and Spirva daily  . CAD (coronary artery disease)    a. LHC 11/2013 done for CP/fluid retention: mild disease in prox LAD, mild-mod disease in mRCA, EF 60% with normal LVEDP.  [name] COPD (chronic obstructive pulmonary disease) (HCC)    a. nocturnal O2.  . Diabetes mellitus   . Diastolic dysfunction   . Family history of adverse reaction to anesthesia    mom gets nauseated  . GERD (gastroesophageal reflux disease)    takes Pepcid daily  . History of blood clots    left leg 3-86yrs ago  . Hyperlipidemia   . Hypertension   . Hypertriglyceridemia   . Inguinal hernia, left 01/2015    . Muscle spasm    takes Flexeril daily as needed  . Peripheral neuropathy (HCC)    takes Lyrica daily  . RBBB   . Seizures ([place])    takes Lamictal daily;last seizure over a yr ago  . Sinus tachycardia   . Smokers' cough ([place])   . Stroke [facility]) 1989   left sided weakness   Family History  Problem Relation Age of Onset  . Venous thrombosis Brother   . Other Brother     BRAIN TUMOR  . Asthma Father   . Diabetes Father   . Coronary artery disease Mother   . Hypertension Mother   .  Diabetes Mother   . Asthma Sister   . Diabetes type II Brother    Past Surgical History:  Procedure Laterality Date  . INGUINAL HERNIA REPAIR Left 04/08/2015   Procedure: OPEN LEFT INGUINAL HERNIA REPAIR WITH MESH;  Surgeon: Ralene Ok, MD;  Location: [place];  Service: General;  Laterality: Left;  . INSERTION OF MESH Left 04/08/2015   Procedure: INSERTION OF MESH;  Surgeon: Ralene Ok, MD;  Location: [place];  Service: General;  Laterality: Left;  . LAPAROSCOPY     Endometriosis  . LEFT HEART CATHETERIZATION WITH CORONARY ANGIOGRAM N/A 12/05/2013   Procedure: LEFT HEART CATHETERIZATION WITH CORONARY ANGIOGRAM;  Surgeon: Jettie Booze, MD;  [facility] LAB;  Service: Cardiovascular;  Laterality: N/A;  . right kidney drained    . TEE WITHOUT CARDIOVERSION N/A 11/28/2013   Procedure: TRANSESOPHAGEAL ECHOCARDIOGRAM (TEE);  Surgeon: Thayer Headings, MD;  Location: [facility] ENDOSCOPY;  Service: Cardiovascular;  Laterality: N/A;   Social History   Social History  . Marital status: Married    Spouse name: N/A  . Number of children: N/A  . Years of education: N/A   Occupational History  . Not on file.   Social History Main Topics  . Smoking status: Current Every Day Smoker    Packs/day: 1.00    Years: 28.00    Types: Cigarettes  . Smokeless tobacco: Never Used  . Alcohol use No  . Drug use: No  . Sexual activity: Yes   Other Topics Concern  . Not on file   Social History Narrative    Lives in [place]   Married but lives with Boyfriend - not legally separated   Disabled - for BiPolar, Seizure disorder, diabetes   Formerly worked at [employer]           Review of Systems: General: negative for chills, fever, night sweats or weight changes.  Cardiovascular: negative for chest pain, dyspnea on exertion, edema, orthopnea, palpitations, paroxysmal nocturnal dyspnea or shortness of breath Dermatological: negative for rash Respiratory: negative for cough or wheezing Urologic: negative for hematuria Abdominal: negative for nausea, vomiting, diarrhea, bright red blood per rectum, melena, or hematemesis Neurologic: negative for visual changes, syncope, or dizziness All other systems reviewed and are otherwise negative except as noted above.   Physical Exam:  Blood pressure (!) 130/58, pulse (!) 104, height 5\' 5"  (1.651 m), weight 266 lb (120.7 kg), last menstrual period 11/11/2011.  General appearance: alert, cooperative and no distress Neck: no carotid bruit and no JVD Lungs: clear to auscultation bilaterally Heart: regular rhythm, tachy rate, S1, S2 normal, no murmur, click, rub or gallop Extremities: no LEE Pulses: 2+ and symmetric Skin: warm and dry Neurologic: Grossly normal  EKG not performed   ASSESSMENT AND PLAN:   1. Palpitations/ Tachycardia: recent monitor showed occasional PVCs and sinus tach with maximal heart rate of 150 bpm. No serious arrhythmias. TSH and electrolytes were normal. She recently had a Lexiscan nuclear stress test that was negative for ischemia. There are multiple factors that I believe are contributing to her resting tachycardia/ palpitations including tobacco use, caffeine and frequent daily use of albuterol. She takes albuterol for her COPD. I discussed changing her rescue inhaler to Xopenex, however she reports that she did not tolerate this well in the past. She was previously on Xopenex and it did not improve her dyspnea/ wheezing.  Albuterol is the only medication that seems to work for her. I explained to her the association of increase albuterol  use and tachycardia. It was recommended that she cut back on smoking. I also recommended that she decrease her caffeine consumption. She drinks on average one cup of coffee and one can of diet [facility] daily. In the meantime, we will increase her Cardizem to 240 mg daily. Her blood pressure is 130/58 and resting heart rate is 104 bpm. Patient was instructed to monitor both heart rate and blood pressure at home. She is to notify our office if any hypotension or bradycardia. Follow-up with pharmacy in 4 weeks for repeat assessment of her blood pressure and heart rate and to make further medication adjustments if needed. F/u with Dr. Irish Lack in 6 months.    Lyda Jester PA-C 04/20/2016 1:58 PM

## 2016-04-20 NOTE — Patient Instructions (Signed)
Medication Instructions:  Your physician has recommended you make the following change in your medication:  1.  INCREASE the Diltiazem to 240 mg taking 1 tablet daily (you may take 2 of the 120 mg tablets until you use them all up)   Labwork: None ordered  Testing/Procedures: None ordered  Follow-Up: Your physician wants you to follow-up in: [place] DR. VARANASI   You will receive a reminder letter in the mail two months in advance. If you don't receive a letter, please call our office to schedule the follow-up appointment.   Any Other Special Instructions Will Be Listed Below (If Applicable).  Call our office if your top blood pressure number runs under 100 or your heart rate gets under 55  You need to consider [place]!  Try to limit your CAFFEINE intake    If you need a refill on your cardiac medications before your next appointment, please call your pharmacy.

## 2016-05-06 DIAGNOSIS — F3131 Bipolar disorder, current episode depressed, mild: Secondary | ICD-10-CM | POA: Diagnosis not present

## 2016-05-16 ENCOUNTER — Encounter: Payer: Self-pay | Admitting: Physician Assistant

## 2016-05-16 ENCOUNTER — Inpatient Hospital Stay (HOSPITAL_COMMUNITY)
Admission: AD | Admit: 2016-05-16 | Discharge: 2016-05-18 | DRG: 292 | Disposition: A | Discharge status: Home / Self Care | Payer: BLUE CROSS/BLUE SHIELD | Source: Ambulatory Visit | Attending: Interventional Cardiology | Admitting: Interventional Cardiology

## 2016-05-16 ENCOUNTER — Ambulatory Visit (INDEPENDENT_AMBULATORY_CARE_PROVIDER_SITE_OTHER): Payer: BLUE CROSS/BLUE SHIELD | Admitting: Physician Assistant

## 2016-05-16 VITALS — BP 140/64 | HR 100 | Ht 65.0 in | Wt 282.4 lb

## 2016-05-16 DIAGNOSIS — Z6841 Body Mass Index (BMI) 40.0 and over, adult: Secondary | ICD-10-CM | POA: Diagnosis not present

## 2016-05-16 DIAGNOSIS — F17210 Nicotine dependence, cigarettes, uncomplicated: Secondary | ICD-10-CM | POA: Diagnosis present

## 2016-05-16 DIAGNOSIS — F419 Anxiety disorder, unspecified: Secondary | ICD-10-CM | POA: Diagnosis present

## 2016-05-16 DIAGNOSIS — Z825 Family history of asthma and other chronic lower respiratory diseases: Secondary | ICD-10-CM | POA: Diagnosis not present

## 2016-05-16 DIAGNOSIS — R000 Tachycardia, unspecified: Secondary | ICD-10-CM

## 2016-05-16 DIAGNOSIS — G40909 Epilepsy, unspecified, not intractable, without status epilepticus: Secondary | ICD-10-CM | POA: Diagnosis present

## 2016-05-16 DIAGNOSIS — J441 Chronic obstructive pulmonary disease with (acute) exacerbation: Secondary | ICD-10-CM | POA: Diagnosis not present

## 2016-05-16 DIAGNOSIS — Z794 Long term (current) use of insulin: Secondary | ICD-10-CM

## 2016-05-16 DIAGNOSIS — I493 Ventricular premature depolarization: Secondary | ICD-10-CM | POA: Diagnosis not present

## 2016-05-16 DIAGNOSIS — I509 Heart failure, unspecified: Secondary | ICD-10-CM

## 2016-05-16 DIAGNOSIS — E669 Obesity, unspecified: Secondary | ICD-10-CM | POA: Diagnosis present

## 2016-05-16 DIAGNOSIS — I5033 Acute on chronic diastolic (congestive) heart failure: Secondary | ICD-10-CM

## 2016-05-16 DIAGNOSIS — Z7982 Long term (current) use of aspirin: Secondary | ICD-10-CM

## 2016-05-16 DIAGNOSIS — E1165 Type 2 diabetes mellitus with hyperglycemia: Secondary | ICD-10-CM | POA: Diagnosis present

## 2016-05-16 DIAGNOSIS — J410 Simple chronic bronchitis: Secondary | ICD-10-CM | POA: Diagnosis not present

## 2016-05-16 DIAGNOSIS — Z7951 Long term (current) use of inhaled steroids: Secondary | ICD-10-CM | POA: Diagnosis not present

## 2016-05-16 DIAGNOSIS — R0600 Dyspnea, unspecified: Secondary | ICD-10-CM | POA: Diagnosis not present

## 2016-05-16 DIAGNOSIS — IMO0002 Reserved for concepts with insufficient information to code with codable children: Secondary | ICD-10-CM

## 2016-05-16 DIAGNOSIS — I5032 Chronic diastolic (congestive) heart failure: Secondary | ICD-10-CM | POA: Diagnosis present

## 2016-05-16 DIAGNOSIS — K219 Gastro-esophageal reflux disease without esophagitis: Secondary | ICD-10-CM | POA: Diagnosis present

## 2016-05-16 DIAGNOSIS — Z833 Family history of diabetes mellitus: Secondary | ICD-10-CM | POA: Diagnosis not present

## 2016-05-16 DIAGNOSIS — Z8673 Personal history of transient ischemic attack (TIA), and cerebral infarction without residual deficits: Secondary | ICD-10-CM | POA: Diagnosis not present

## 2016-05-16 DIAGNOSIS — J449 Chronic obstructive pulmonary disease, unspecified: Secondary | ICD-10-CM

## 2016-05-16 DIAGNOSIS — I2510 Atherosclerotic heart disease of native coronary artery without angina pectoris: Secondary | ICD-10-CM | POA: Diagnosis not present

## 2016-05-16 DIAGNOSIS — I110 Hypertensive heart disease with heart failure: Secondary | ICD-10-CM | POA: Diagnosis not present

## 2016-05-16 DIAGNOSIS — F319 Bipolar disorder, unspecified: Secondary | ICD-10-CM | POA: Diagnosis present

## 2016-05-16 DIAGNOSIS — R0982 Postnasal drip: Secondary | ICD-10-CM

## 2016-05-16 DIAGNOSIS — I5041 Acute combined systolic (congestive) and diastolic (congestive) heart failure: Secondary | ICD-10-CM | POA: Diagnosis not present

## 2016-05-16 DIAGNOSIS — I4510 Unspecified right bundle-branch block: Secondary | ICD-10-CM | POA: Diagnosis present

## 2016-05-16 DIAGNOSIS — E781 Pure hyperglyceridemia: Secondary | ICD-10-CM | POA: Diagnosis present

## 2016-05-16 DIAGNOSIS — I10 Essential (primary) hypertension: Secondary | ICD-10-CM | POA: Diagnosis not present

## 2016-05-16 DIAGNOSIS — E118 Type 2 diabetes mellitus with unspecified complications: Secondary | ICD-10-CM

## 2016-05-16 DIAGNOSIS — J45909 Unspecified asthma, uncomplicated: Secondary | ICD-10-CM | POA: Diagnosis not present

## 2016-05-16 DIAGNOSIS — R601 Generalized edema: Secondary | ICD-10-CM | POA: Diagnosis not present

## 2016-05-16 DIAGNOSIS — E119 Type 2 diabetes mellitus without complications: Secondary | ICD-10-CM

## 2016-05-16 DIAGNOSIS — I5043 Acute on chronic combined systolic (congestive) and diastolic (congestive) heart failure: Secondary | ICD-10-CM | POA: Diagnosis not present

## 2016-05-16 DIAGNOSIS — Z8249 Family history of ischemic heart disease and other diseases of the circulatory system: Secondary | ICD-10-CM | POA: Diagnosis not present

## 2016-05-16 DIAGNOSIS — J440 Chronic obstructive pulmonary disease with acute lower respiratory infection: Secondary | ICD-10-CM | POA: Diagnosis not present

## 2016-05-16 DIAGNOSIS — J309 Allergic rhinitis, unspecified: Secondary | ICD-10-CM

## 2016-05-16 LAB — CBC WITH DIFFERENTIAL/PLATELET
Basophils Absolute: 0.1 10*3/uL (ref 0.0–0.1)
Basophils Relative: 1 %
Eosinophils Absolute: 0.4 10*3/uL (ref 0.0–0.7)
Eosinophils Relative: 3 %
HCT: 37.9 % (ref 36.0–46.0)
Hemoglobin: 12.8 g/dL (ref 12.0–15.0)
Lymphocytes Relative: 23 %
Lymphs Abs: 2.8 10*3/uL (ref 0.7–4.0)
MCH: 30 pg (ref 26.0–34.0)
MCHC: 33.8 g/dL (ref 30.0–36.0)
MCV: 89 fL (ref 78.0–100.0)
Monocytes Absolute: 0.6 10*3/uL (ref 0.1–1.0)
Monocytes Relative: 5 %
Neutro Abs: 8.4 10*3/uL — ABNORMAL HIGH (ref 1.7–7.7)
Neutrophils Relative %: 68 %
Platelets: 231 10*3/uL (ref 150–400)
RBC: 4.26 MIL/uL (ref 3.87–5.11)
RDW: 16.9 % — ABNORMAL HIGH (ref 11.5–15.5)
WBC: 12.2 10*3/uL — ABNORMAL HIGH (ref 4.0–10.5)

## 2016-05-16 LAB — COMPREHENSIVE METABOLIC PANEL
ALT: 73 U/L — ABNORMAL HIGH (ref 14–54)
AST: 43 U/L — ABNORMAL HIGH (ref 15–41)
Albumin: 3.4 g/dL — ABNORMAL LOW (ref 3.5–5.0)
Alkaline Phosphatase: 118 U/L (ref 38–126)
Anion gap: 12 (ref 5–15)
BUN: 18 mg/dL (ref 6–20)
CO2: 26 mmol/L (ref 22–32)
Calcium: 9.3 mg/dL (ref 8.9–10.3)
Chloride: 98 mmol/L — ABNORMAL LOW (ref 101–111)
Creatinine, Ser: 1.08 mg/dL — ABNORMAL HIGH (ref 0.44–1.00)
GFR calc Af Amer: >60 mL/min (ref >60)
GFR calc non Af Amer: >60 mL/min (ref >60)
Glucose, Bld: 259 mg/dL — ABNORMAL HIGH (ref 65–99)
Potassium: 3.9 mmol/L (ref 3.5–5.1)
Sodium: 136 mmol/L (ref 135–145)
Total Bilirubin: 0.7 mg/dL (ref 0.3–1.2)
Total Protein: 8.2 g/dL — ABNORMAL HIGH (ref 6.5–8.1)

## 2016-05-16 LAB — TROPONIN I
Troponin I: 0.03 ng/mL (ref <0.03)
Troponin I: <0.03 ng/mL (ref <0.03)

## 2016-05-16 LAB — GLUCOSE, CAPILLARY
Glucose-Capillary: 227 mg/dL — ABNORMAL HIGH (ref 65–99)
Glucose-Capillary: 286 mg/dL — ABNORMAL HIGH (ref 65–99)

## 2016-05-16 LAB — APTT: aPTT: 32 seconds (ref 24–36)

## 2016-05-16 LAB — BRAIN NATRIURETIC PEPTIDE: B Natriuretic Peptide: 20.6 pg/mL (ref 0.0–100.0)

## 2016-05-16 LAB — PROTIME-INR
INR: 1.01
Prothrombin Time: 13.3 seconds (ref 11.4–15.2)

## 2016-05-16 MED ORDER — LORATADINE 10 MG PO TABS
10.0000 mg | ORAL_TABLET | Freq: Every day | ORAL | Status: DC
  Administered 2016-05-17 – 2016-05-18 (×2): 10 mg via ORAL
  Filled 2016-05-16 (×2): qty 1

## 2016-05-16 MED ORDER — LISINOPRIL 10 MG PO TABS
10.0000 mg | ORAL_TABLET | Freq: Every day | ORAL | Status: DC
  Administered 2016-05-16 – 2016-05-17 (×2): 10 mg via ORAL
  Filled 2016-05-16 (×2): qty 1

## 2016-05-16 MED ORDER — ALBUTEROL SULFATE (2.5 MG/3ML) 0.083% IN NEBU
2.5000 mg | INHALATION_SOLUTION | Freq: Four times a day (QID) | RESPIRATORY_TRACT | Status: DC | PRN
  Administered 2016-05-17: 2.5 mg via RESPIRATORY_TRACT
  Filled 2016-05-16: qty 3

## 2016-05-16 MED ORDER — LAMOTRIGINE 100 MG PO TABS
200.0000 mg | ORAL_TABLET | Freq: Every day | ORAL | Status: DC
  Administered 2016-05-16 – 2016-05-18 (×2): 200 mg via ORAL
  Filled 2016-05-16 (×2): qty 8

## 2016-05-16 MED ORDER — FLUOXETINE HCL 20 MG PO CAPS
40.0000 mg | ORAL_CAPSULE | Freq: Every day | ORAL | Status: DC
  Administered 2016-05-17 – 2016-05-18 (×2): 40 mg via ORAL
  Filled 2016-05-16 (×2): qty 2

## 2016-05-16 MED ORDER — FAMOTIDINE 20 MG PO TABS
20.0000 mg | ORAL_TABLET | Freq: Two times a day (BID) | ORAL | Status: DC
  Administered 2016-05-16 – 2016-05-18 (×4): 20 mg via ORAL
  Filled 2016-05-16 (×4): qty 1

## 2016-05-16 MED ORDER — DILTIAZEM HCL ER COATED BEADS 240 MG PO CP24
240.0000 mg | ORAL_CAPSULE | Freq: Every day | ORAL | Status: DC
  Administered 2016-05-17 – 2016-05-18 (×2): 240 mg via ORAL
  Filled 2016-05-16 (×2): qty 1

## 2016-05-16 MED ORDER — PREGABALIN 75 MG PO CAPS
150.0000 mg | ORAL_CAPSULE | Freq: Two times a day (BID) | ORAL | Status: DC
  Administered 2016-05-16 – 2016-05-18 (×4): 150 mg via ORAL
  Filled 2016-05-16 (×4): qty 2

## 2016-05-16 MED ORDER — ENOXAPARIN SODIUM 40 MG/0.4ML ~~LOC~~ SOLN: 40.0000 mg | SUBCUTANEOUS | Status: DC

## 2016-05-16 MED ORDER — POTASSIUM CHLORIDE CRYS ER 20 MEQ PO TBCR
20.0000 meq | EXTENDED_RELEASE_TABLET | Freq: Two times a day (BID) | ORAL | Status: DC
  Administered 2016-05-16 – 2016-05-18 (×4): 20 meq via ORAL
  Filled 2016-05-16 (×4): qty 1

## 2016-05-16 MED ORDER — ONDANSETRON HCL 4 MG/2ML IJ SOLN: 4.0000 mg | Freq: Four times a day (QID) | INTRAMUSCULAR | Status: DC | PRN

## 2016-05-16 MED ORDER — SODIUM CHLORIDE 0.9% FLUSH: 3.0000 mL | INTRAVENOUS | Status: DC | PRN

## 2016-05-16 MED ORDER — SODIUM CHLORIDE 0.9 % IV SOLN
250.0000 mL | INTRAVENOUS | Status: DC | PRN
Start: 2016-05-16 — End: 2016-05-18

## 2016-05-16 MED ORDER — CLONAZEPAM 0.5 MG PO TABS: 1.0000 mg | ORAL_TABLET | Freq: Two times a day (BID) | ORAL | Status: DC

## 2016-05-16 MED ORDER — SODIUM CHLORIDE 0.9% FLUSH
3.0000 mL | Freq: Two times a day (BID) | INTRAVENOUS | Status: DC
  Administered 2016-05-16 – 2016-05-18 (×5): 3 mL via INTRAVENOUS

## 2016-05-16 MED ORDER — GLIMEPIRIDE 4 MG PO TABS
4.0000 mg | ORAL_TABLET | Freq: Two times a day (BID) | ORAL | Status: DC
  Administered 2016-05-16 – 2016-05-17 (×2): 4 mg via ORAL
  Filled 2016-05-16 (×3): qty 1

## 2016-05-16 MED ORDER — INSULIN ASPART 100 UNIT/ML ~~LOC~~ SOLN
0.0000 [IU] | Freq: Three times a day (TID) | SUBCUTANEOUS | Status: DC
  Administered 2016-05-16: 7 [IU] via SUBCUTANEOUS
  Administered 2016-05-17: 15 [IU] via SUBCUTANEOUS

## 2016-05-16 MED ORDER — TIOTROPIUM BROMIDE MONOHYDRATE 2.5 MCG/ACT IN AERS: 2.0000 | INHALATION_SPRAY | Freq: Every day | RESPIRATORY_TRACT | Status: DC

## 2016-05-16 MED ORDER — ACETAMINOPHEN 325 MG PO TABS: 650.0000 mg | ORAL_TABLET | ORAL | Status: DC | PRN

## 2016-05-16 MED ORDER — IBUPROFEN 600 MG PO TABS: 600.0000 mg | ORAL_TABLET | Freq: Two times a day (BID) | ORAL | Status: DC | PRN

## 2016-05-16 MED ORDER — INSULIN ASPART 100 UNIT/ML ~~LOC~~ SOLN
0.0000 [IU] | Freq: Every day | SUBCUTANEOUS | Status: DC
  Administered 2016-05-16: 3 [IU] via SUBCUTANEOUS

## 2016-05-16 MED ORDER — ASPIRIN EC 81 MG PO TBEC
81.0000 mg | DELAYED_RELEASE_TABLET | Freq: Every morning | ORAL | Status: DC
  Administered 2016-05-17 – 2016-05-18 (×2): 81 mg via ORAL
  Filled 2016-05-16 (×2): qty 1

## 2016-05-16 MED ORDER — FUROSEMIDE 10 MG/ML IJ SOLN
40.0000 mg | Freq: Two times a day (BID) | INTRAMUSCULAR | Status: AC
  Administered 2016-05-16 – 2016-05-17 (×3): 40 mg via INTRAVENOUS
  Filled 2016-05-16 (×3): qty 4

## 2016-05-16 MED ORDER — TIOTROPIUM BROMIDE MONOHYDRATE 18 MCG IN CAPS
18.0000 ug | ORAL_CAPSULE | Freq: Every day | RESPIRATORY_TRACT | Status: DC
  Administered 2016-05-17 – 2016-05-18 (×2): 18 ug via RESPIRATORY_TRACT
  Filled 2016-05-16: qty 5

## 2016-05-16 MED ORDER — DULOXETINE HCL 60 MG PO CPEP
60.0000 mg | ORAL_CAPSULE | Freq: Every day | ORAL | Status: DC
  Administered 2016-05-17 – 2016-05-18 (×2): 60 mg via ORAL
  Filled 2016-05-16 (×2): qty 1

## 2016-05-16 MED ORDER — ENOXAPARIN SODIUM 40 MG/0.4ML ~~LOC~~ SOLN
40.0000 mg | SUBCUTANEOUS | Status: DC
  Administered 2016-05-16 – 2016-05-17 (×2): 40 mg via SUBCUTANEOUS
  Filled 2016-05-16 (×2): qty 0.4

## 2016-05-16 MED ORDER — BUSPIRONE HCL 10 MG PO TABS
15.0000 mg | ORAL_TABLET | Freq: Two times a day (BID) | ORAL | Status: DC
  Administered 2016-05-16 – 2016-05-18 (×4): 15 mg via ORAL
  Filled 2016-05-16 (×4): qty 2

## 2016-05-16 MED ORDER — ALBUTEROL SULFATE HFA 108 (90 BASE) MCG/ACT IN AERS: 1.0000 | INHALATION_SPRAY | Freq: Four times a day (QID) | RESPIRATORY_TRACT | Status: DC | PRN

## 2016-05-16 MED ORDER — QUETIAPINE FUMARATE ER 400 MG PO TB24
800.0000 mg | ORAL_TABLET | Freq: Every day | ORAL | Status: DC
  Administered 2016-05-16 – 2016-05-17 (×2): 800 mg via ORAL
  Filled 2016-05-16 (×2): qty 2

## 2016-05-16 NOTE — H&P (Signed)
Cardiology Office Note:    Date:  05/16/2016   ID:  [name], DOB 12/06/1970, MRN [id number]  PCP:  Alvester Chou, NP  Cardiologist:  Dr. Casandra Doffing   Electrophysiologist:  n/a  Referring MD: Alvester Chou, NP   Chief Complaint  Patient presents with  . Other    weight gain    History of Present Illness:    [name] is a 45 y.o. female with a hx of nonobstructive CAD, diastolic CHF, tobacco abuse, COPD, RBBB.  She was evaluated by Lyda Jester, PA-C in 8/17 for sinus tachycardia.  TSH was 0.56. Holter monitor demonstrated no significant arrhythmias. There were occasional PVCs.  Highest HR > 150.  She FU with Melina Copa, PA-C.  Toprol was changed to Diltiazem.  Nuclear stress test was neg for ischemia.  Last seen by [facility] 10/17.  It was felt her sinus tachy was related to ongoing tobacco abuse, caffeine and Rx with albuterol.  Her Diltiazem dose was increased to 240 QD.    She is here today with her husband. She notes significant weight gain, leg and abdominal swelling and worsening dyspnea on exertion over the past 2 weeks.  She saw her PCP this AM and was told that she needed to be admitted to the hospital.  For unclear reasons, she was set up for an appointment with me in clinic instead of being sent to the hospital.  She was on Lasix some years ago.  This was resumed last week at 40 mg QD.  She has only gained more weight since that time.  She notes increased wheezing and cough.  She has assoc chest tightness with this and this is overall worse.  She denies syncope.  She denies fevers, chills.  She denies dietary indiscretion with salt.  Prior CV studies that were reviewed today include:    Myoview 03/30/16 Nuclear stress EF: 64%. The left ventricular ejection fraction is normal (55-65%). There was no ST segment deviation noted during stress. The study is normal. This is a low risk study.  Holter 8/17 NSR and sinus tachycardia, with highest HR above 150  bpm. Occasional PVCs.  Echo 5/17 Mild LVH, vigorous LVEF, EF 65-70, Gr 2 DD, AV calcified and mildly restricted (mean 11)  TEE 5/15 EF 60-65  LHC 5/15 LM widely patent. LAD mild to mod diff disease LCx patent RCA mid 40 EF 60, LVEDP 9  Carotid US 9/14 Bilateral ICA 1-39  Past Medical History:  Diagnosis Date  . Anxiety    takes Prozac daily  . Aortic valve calcification   . Asthma    Advair and Spirva daily  . CAD (coronary artery disease)    a. LHC 11/2013 done for CP/fluid retention: mild disease in prox LAD, mild-mod disease in mRCA, EF 60% with normal LVEDP.  Marland Kitchen COPD (chronic obstructive pulmonary disease) (HCC)    a. nocturnal O2.  . Diabetes mellitus   . Diastolic dysfunction   . Family history of adverse reaction to anesthesia    mom gets nauseated  . GERD (gastroesophageal reflux disease)    takes Pepcid daily  . History of blood clots    left leg 3-36yrs ago  . Hyperlipidemia   . Hypertension   . Hypertriglyceridemia   . Inguinal hernia, left 01/2015  . Muscle spasm    takes Flexeril daily as needed  . Peripheral neuropathy (HCC)    takes Lyrica daily  . RBBB   . Seizures ([place])    takes Lamictal  daily;last seizure over a yr ago  . Sinus tachycardia   . Smokers' cough ([place])   . Stroke [facility]) 1989   left sided weakness    Past Surgical History:  Procedure Laterality Date  . INGUINAL HERNIA REPAIR Left 04/08/2015   Procedure: OPEN LEFT INGUINAL HERNIA REPAIR WITH MESH;  Surgeon: Ralene Ok, MD;  Location: [place];  Service: General;  Laterality: Left;  . INSERTION OF MESH Left 04/08/2015   Procedure: INSERTION OF MESH;  Surgeon: Ralene Ok, MD;  Location: [place];  Service: General;  Laterality: Left;  . LAPAROSCOPY     Endometriosis  . LEFT HEART CATHETERIZATION WITH CORONARY ANGIOGRAM N/A 12/05/2013   Procedure: LEFT HEART CATHETERIZATION WITH CORONARY ANGIOGRAM;  Surgeon: Jettie Booze, MD;  Location: [facility] CATH LAB;  Service: Cardiovascular;   Laterality: N/A;  . right kidney drained    . TEE WITHOUT CARDIOVERSION N/A 11/28/2013   Procedure: TRANSESOPHAGEAL ECHOCARDIOGRAM (TEE);  Surgeon: Thayer Headings, MD;  Location: [facility] ENDOSCOPY;  Service: Cardiovascular;  Laterality: N/A;    Current Medications: Current Meds  Medication Sig  . aspirin EC 81 MG tablet Take 81 mg by mouth every morning.  . busPIRone (BUSPAR) 15 MG tablet Take 15 mg by mouth 2 (two) times daily. [name]  . clonazePAM (KLONOPIN) 1 MG tablet Take 1 mg by mouth 2 (two) times daily.   Marland Kitchen diltiazem (CARDIZEM CD) 240 MG 24 hr capsule Take 1 capsule (240 mg total) by mouth daily. To replace Toprol  . DULoxetine (CYMBALTA) 60 MG capsule Take 60 mg by mouth daily.  . famotidine (PEPCID) 20 MG tablet Take 20 mg by mouth 2 (two) times daily.  Marland Kitchen FLUoxetine (PROZAC) 20 MG capsule Take 40 mg by mouth daily.  Marland Kitchen glimepiride (AMARYL) 4 MG tablet Take 4 mg by mouth 2 (two) times daily. Dr Alvester Chou  . ibuprofen (ADVIL,MOTRIN) 200 MG tablet Take 600 mg by mouth 2 (two) times daily as needed.  . insulin aspart (NOVOLOG) 100 UNIT/ML injection Inject 20 Units into the skin 2 (two) times daily. Alvester Chou  . insulin regular human CONCENTRATED (HUMULIN R) 500 UNIT/ML SOLN injection Inject 100-180 Units into the skin 2 (two) times daily with a meal. 100 units = 200, 180 units = 300 and above  . lamoTRIgine (LAMICTAL) 200 MG tablet Take 200 mg by mouth at bedtime.  Marland Kitchen lisinopril (PRINIVIL,ZESTRIL) 10 MG tablet Take 1 tablet (10 mg total) by mouth at bedtime.  Marland Kitchen loratadine (CLARITIN) 10 MG tablet Take 1 tablet (10 mg total) by mouth daily.  . OXYGEN Inhale 2 L into the lungs at bedtime.  . pregabalin (LYRICA) 150 MG capsule Take 1 capsule (150 mg total) by mouth 2 (two) times daily.  Marland Kitchen PROVENTIL HFA 108 (90 Base) MCG/ACT inhaler INHALE TWO (2) PUFFS EVERY FOUR HOURS ASNEEDED FOR SHORTNESS OF BREATH  . QUEtiapine (SEROQUEL XR) 400 MG 24 hr tablet Take 800 mg by mouth at bedtime.  Marland Kitchen  Respiratory Therapy Supplies (PARI SOFT PLASTIC ADULT MASK) MISC Use with oxygen, To be titrated by respiratory therapist  . SPIRIVA RESPIMAT 2.5 MCG/ACT AERS INHALE 2 PUFFS INTO THE LUNGS DAILY.     Allergies:   Metoprolol; Montelukast sodium; Morphine sulfate; Penicillins; and Gabapentin   Social History   Social History  . Marital status: Married    Spouse name: N/A  . Number of children: N/A  . Years of education: N/A   Social History Main Topics  . Smoking status: Current  Every Day Smoker    Packs/day: 1.00    Years: 28.00    Types: Cigarettes  . Smokeless tobacco: Never Used  . Alcohol use No  . Drug use: No  . Sexual activity: Yes   Other Topics Concern  . None   Social History Narrative   Lives in [place]   Married but lives with Boyfriend - not legally separated   Disabled - for BiPolar, Seizure disorder, diabetes   Formerly worked at [employer] History:  The patient's family history includes Asthma in her father and sister; Coronary artery disease in her mother; Diabetes in her father and mother; Diabetes type II in her brother; Hypertension in her mother; Other in her brother; Venous thrombosis in her brother.   ROS:   Please see the history of present illness.    Review of Systems  Constitution: Positive for diaphoresis, malaise/fatigue and weight gain.  Cardiovascular: Positive for dyspnea on exertion and leg swelling.  Respiratory: Positive for cough, shortness of breath and wheezing.   Musculoskeletal: Positive for joint swelling.  Gastrointestinal: Positive for constipation.  Neurological: Positive for headaches.  Psychiatric/Behavioral: The patient is nervous/anxious.    All other systems reviewed and are negative.   EKGs/Labs/Other Test Reviewed:    EKG:  EKG is  ordered today.  The ekg ordered today demonstrates NSR, HR 100, RBBB, no change from prior tracing.  Recent Labs: 11/03/2015: ALT 86; B Natriuretic Peptide  20.2 02/25/2016: BUN 17; Creat 0.94; Hemoglobin 14.6; Platelets 279; Potassium 4.8; Sodium 132; TSH 0.56   Recent Lipid Panel    Component Value Date/Time   CHOL 166 02/04/2015 1056   TRIG 352 (H) 02/04/2015 1056   HDL 37 (L) 02/04/2015 1056   CHOLHDL 4.5 02/04/2015 1056   VLDL 70 (H) 02/04/2015 1056   LDLCALC 59 02/04/2015 1056   LDLDIRECT 145 (H) 04/30/2009 2013     Physical Exam:    VS:  BP 140/64   Pulse 100   Ht 5\' 5"  (1.651 m)   Wt 282 lb 6.4 oz (128.1 kg)   LMP 11/11/2011   SpO2 92%   BMI 46.99 kg/m     Wt Readings from Last 3 Encounters:  05/16/16 282 lb 6.4 oz (128.1 kg)  04/20/16 266 lb (120.7 kg)  03/29/16 264 lb (119.7 kg)     Physical Exam  Constitutional: She is oriented to person, place, and time. She appears well-developed and well-nourished. No distress.  HENT:  Head: Normocephalic and atraumatic.  Eyes: No scleral icterus.  Neck: JVD present.  JVP 8-9 cm  Cardiovascular: Normal rate, regular rhythm and normal heart sounds.   No murmur heard. Pulmonary/Chest: She has decreased breath sounds. She has no wheezes. She has no rales.  Abdominal: She exhibits distension. There is no tenderness.  Musculoskeletal: She exhibits edema.  1-2+ bilateral LE edema  Neurological: She is alert and oriented to person, place, and time.  Skin: Skin is warm and dry.  Psychiatric: She has a normal mood and affect.    ASSESSMENT:    1. Acute on chronic diastolic congestive heart failure ([place])   2. Coronary artery disease involving native coronary artery of native heart without angina pectoris   3. Chronic obstructive pulmonary disease, unspecified COPD type ([place])   4. Sinus tachycardia   5. Uncontrolled type 2 diabetes mellitus with complication, with long-term current use of insulin ([place])    PLAN:    In order  of problems listed above:  1. Acute on chronic diastolic CHF - She is significantly volume overloaded.  She presents to the office today with the  expectation that she is getting admitted for treatment of her CHF.  This appears to be the most appropriate approach.    -  Admit to [facility]  -  DVT dose Lovenox  -  Start Lasix 40 IV bid  -  Monitor renal function closely with IV diuresis  -  Repeat Echo  2. CAD - Minimal plaque on LHC in 2015.  Myoview in 9/17 low risk.  She notes increased pain that she typically has with her COPD.  Suspect this is related to her CHF.  Continue ASA.  Will cycle enzymes.  3. COPD - Continue current management.  Will order SSI.    4. Sinus tachycardia - Recent workup with Lyda Jester, PA-C.  Holter without significant arrhythmia. Elevated HR felt to be related to albuterol, COPD, tobacco abuse, caffeine use.  Continue calcium channel blocker.   5. Diabetes - Continue current Rx.  Cover with SSI.   Signed, Richardson Dopp, PA-C  05/16/2016 2:24 PM    [facility] [address], [place], [place]  [address] Phone: [phone]; Fax: [phone]    I have examined the patient and reviewed assessment and plan and discussed with patient.  Agree with above as stated.  Plan for IV diuresis.  Significant weight gain.   Larae Grooms

## 2016-05-16 NOTE — Patient Instructions (Signed)
YOU ARE BEING TO [place] [address] EAST UNIT ROOM 10

## 2016-05-16 NOTE — Progress Notes (Signed)
Cardiology Office Note:    Date:  05/16/2016   ID:  [name], DOB 1970-09-18, MRN [id number]  PCP:  Alvester Chou, NP  Cardiologist:  Dr. Casandra Doffing   Electrophysiologist:  n/a  Referring MD: Alvester Chou, NP   Chief Complaint  Patient presents with  . Other    weight gain    History of Present Illness:    [name] is a 45 y.o. female with a hx of nonobstructive CAD, diastolic CHF, tobacco abuse, COPD, RBBB.  She was evaluated by Lyda Jester, PA-C in 8/17 for sinus tachycardia.  TSH was 0.56. Holter monitor demonstrated no significant arrhythmias. There were occasional PVCs.  Highest HR > 150.  She FU with Melina Copa, PA-C.  Toprol was changed to Diltiazem.  Nuclear stress test was neg for ischemia.  Last seen by [facility] 10/17.  It was felt her sinus tachy was related to ongoing tobacco abuse, caffeine and Rx with albuterol.  Her Diltiazem dose was increased to 240 QD.    She is here today with her husband. She notes significant weight gain, leg and abdominal swelling and worsening dyspnea on exertion over the past 2 weeks.  She saw her PCP this AM and was told that she needed to be admitted to the hospital.  For unclear reasons, she was set up for an appointment with me in clinic instead of being sent to the hospital.  She was on Lasix some years ago.  This was resumed last week at 40 mg QD.  She has only gained more weight since that time.  She notes increased wheezing and cough.  She has assoc chest tightness with this and this is overall worse.  She denies syncope.  She denies fevers, chills.  She denies dietary indiscretion with salt.  Prior CV studies that were reviewed today include:    Myoview 03/30/16 Nuclear stress EF: 64%. The left ventricular ejection fraction is normal (55-65%). There was no ST segment deviation noted during stress. The study is normal. This is a low risk study.  Holter 8/17 NSR and sinus tachycardia, with highest HR above 150  bpm. Occasional PVCs.  Echo 5/17 Mild LVH, vigorous LVEF, EF 65-70, Gr 2 DD, AV calcified and mildly restricted (mean 11)  TEE 5/15 EF 60-65  LHC 5/15 LM widely patent. LAD mild to mod diff disease LCx patent RCA mid 40 EF 60, LVEDP 9  Carotid US 9/14 Bilateral ICA 1-39  Past Medical History:  Diagnosis Date  . Anxiety    takes Prozac daily  . Aortic valve calcification   . Asthma    Advair and Spirva daily  . CAD (coronary artery disease)    a. LHC 11/2013 done for CP/fluid retention: mild disease in prox LAD, mild-mod disease in mRCA, EF 60% with normal LVEDP.  Marland Kitchen COPD (chronic obstructive pulmonary disease) (HCC)    a. nocturnal O2.  . Diabetes mellitus   . Diastolic dysfunction   . Family history of adverse reaction to anesthesia    mom gets nauseated  . GERD (gastroesophageal reflux disease)    takes Pepcid daily  . History of blood clots    left leg 3-65yrs ago  . Hyperlipidemia   . Hypertension   . Hypertriglyceridemia   . Inguinal hernia, left 01/2015  . Muscle spasm    takes Flexeril daily as needed  . Peripheral neuropathy (HCC)    takes Lyrica daily  . RBBB   . Seizures ([place])    takes Lamictal  daily;last seizure over a yr ago  . Sinus tachycardia   . Smokers' cough ([place])   . Stroke [facility]) 1989   left sided weakness    Past Surgical History:  Procedure Laterality Date  . INGUINAL HERNIA REPAIR Left 04/08/2015   Procedure: OPEN LEFT INGUINAL HERNIA REPAIR WITH MESH;  Surgeon: Ralene Ok, MD;  Location: [place];  Service: General;  Laterality: Left;  . INSERTION OF MESH Left 04/08/2015   Procedure: INSERTION OF MESH;  Surgeon: Ralene Ok, MD;  Location: [place];  Service: General;  Laterality: Left;  . LAPAROSCOPY     Endometriosis  . LEFT HEART CATHETERIZATION WITH CORONARY ANGIOGRAM N/A 12/05/2013   Procedure: LEFT HEART CATHETERIZATION WITH CORONARY ANGIOGRAM;  Surgeon: Jettie Booze, MD;  Location: [facility] CATH LAB;  Service: Cardiovascular;   Laterality: N/A;  . right kidney drained    . TEE WITHOUT CARDIOVERSION N/A 11/28/2013   Procedure: TRANSESOPHAGEAL ECHOCARDIOGRAM (TEE);  Surgeon: Thayer Headings, MD;  Location[facility] ENDOSCOPY;  Service: Cardiovascular;  Laterality: N/A;    Current Medications: Current Meds  Medication Sig  . aspirin EC 81 MG tablet Take 81 mg by mouth every morning.  . busPIRone (BUSPAR) 15 MG tablet Take 15 mg by mouth 2 (two) times daily. [name]  . clonazePAM (KLONOPIN) 1 MG tablet Take 1 mg by mouth 2 (two) times daily.   Marland Kitchen diltiazem (CARDIZEM CD) 240 MG 24 hr capsule Take 1 capsule (240 mg total) by mouth daily. To replace Toprol  . DULoxetine (CYMBALTA) 60 MG capsule Take 60 mg by mouth daily.  . famotidine (PEPCID) 20 MG tablet Take 20 mg by mouth 2 (two) times daily.  Marland Kitchen FLUoxetine (PROZAC) 20 MG capsule Take 40 mg by mouth daily.  Marland Kitchen glimepiride (AMARYL) 4 MG tablet Take 4 mg by mouth 2 (two) times daily. Dr Alvester Chou  . ibuprofen (ADVIL,MOTRIN) 200 MG tablet Take 600 mg by mouth 2 (two) times daily as needed.  . insulin aspart (NOVOLOG) 100 UNIT/ML injection Inject 20 Units into the skin 2 (two) times daily. Alvester Chou  . insulin regular human CONCENTRATED (HUMULIN R) 500 UNIT/ML SOLN injection Inject 100-180 Units into the skin 2 (two) times daily with a meal. 100 units = 200, 180 units = 300 and above  . lamoTRIgine (LAMICTAL) 200 MG tablet Take 200 mg by mouth at bedtime.  Marland Kitchen lisinopril (PRINIVIL,ZESTRIL) 10 MG tablet Take 1 tablet (10 mg total) by mouth at bedtime.  Marland Kitchen loratadine (CLARITIN) 10 MG tablet Take 1 tablet (10 mg total) by mouth daily.  . OXYGEN Inhale 2 L into the lungs at bedtime.  . pregabalin (LYRICA) 150 MG capsule Take 1 capsule (150 mg total) by mouth 2 (two) times daily.  Marland Kitchen PROVENTIL HFA 108 (90 Base) MCG/ACT inhaler INHALE TWO (2) PUFFS EVERY FOUR HOURS ASNEEDED FOR SHORTNESS OF BREATH  . QUEtiapine (SEROQUEL XR) 400 MG 24 hr tablet Take 800 mg by mouth at bedtime.  Marland Kitchen  Respiratory Therapy Supplies (PARI SOFT PLASTIC ADULT MASK) MISC Use with oxygen, To be titrated by respiratory therapist  . SPIRIVA RESPIMAT 2.5 MCG/ACT AERS INHALE 2 PUFFS INTO THE LUNGS DAILY.     Allergies:   Metoprolol; Montelukast sodium; Morphine sulfate; Penicillins; and Gabapentin   Social History   Social History  . Marital status: Married    Spouse name: N/A  . Number of children: N/A  . Years of education: N/A   Social History Main Topics  . Smoking status: Current  Every Day Smoker    Packs/day: 1.00    Years: 28.00    Types: Cigarettes  . Smokeless tobacco: Never Used  . Alcohol use No  . Drug use: No  . Sexual activity: Yes   Other Topics Concern  . None   Social History Narrative   Lives in [place]   Married but lives with Boyfriend - not legally separated   Disabled - for BiPolar, Seizure disorder, diabetes   Formerly worked at [employer] History:  The patient's family history includes Asthma in her father and sister; Coronary artery disease in her mother; Diabetes in her father and mother; Diabetes type II in her brother; Hypertension in her mother; Other in her brother; Venous thrombosis in her brother.   ROS:   Please see the history of present illness.    Review of Systems  Constitution: Positive for diaphoresis, malaise/fatigue and weight gain.  Cardiovascular: Positive for dyspnea on exertion and leg swelling.  Respiratory: Positive for cough, shortness of breath and wheezing.   Musculoskeletal: Positive for joint swelling.  Gastrointestinal: Positive for constipation.  Neurological: Positive for headaches.  Psychiatric/Behavioral: The patient is nervous/anxious.    All other systems reviewed and are negative.   EKGs/Labs/Other Test Reviewed:    EKG:  EKG is  ordered today.  The ekg ordered today demonstrates NSR, HR 100, RBBB, no change from prior tracing.  Recent Labs: 11/03/2015: ALT 86; B Natriuretic Peptide  20.2 02/25/2016: BUN 17; Creat 0.94; Hemoglobin 14.6; Platelets 279; Potassium 4.8; Sodium 132; TSH 0.56   Recent Lipid Panel    Component Value Date/Time   CHOL 166 02/04/2015 1056   TRIG 352 (H) 02/04/2015 1056   HDL 37 (L) 02/04/2015 1056   CHOLHDL 4.5 02/04/2015 1056   VLDL 70 (H) 02/04/2015 1056   LDLCALC 59 02/04/2015 1056   LDLDIRECT 145 (H) 04/30/2009 2013     Physical Exam:    VS:  BP 140/64   Pulse 100   Ht 5\' 5"  (1.651 m)   Wt 282 lb 6.4 oz (128.1 kg)   LMP 11/11/2011   SpO2 92%   BMI 46.99 kg/m     Wt Readings from Last 3 Encounters:  05/16/16 282 lb 6.4 oz (128.1 kg)  04/20/16 266 lb (120.7 kg)  03/29/16 264 lb (119.7 kg)     Physical Exam  Constitutional: She is oriented to person, place, and time. She appears well-developed and well-nourished. No distress.  HENT:  Head: Normocephalic and atraumatic.  Eyes: No scleral icterus.  Neck: JVD present.  JVP 8-9 cm  Cardiovascular: Normal rate, regular rhythm and normal heart sounds.   No murmur heard. Pulmonary/Chest: She has decreased breath sounds. She has no wheezes. She has no rales.  Abdominal: She exhibits distension. There is no tenderness.  Musculoskeletal: She exhibits edema.  1-2+ bilateral LE edema  Neurological: She is alert and oriented to person, place, and time.  Skin: Skin is warm and dry.  Psychiatric: She has a normal mood and affect.    ASSESSMENT:    1. Acute on chronic diastolic congestive heart failure ([place])   2. Coronary artery disease involving native coronary artery of native heart without angina pectoris   3. Chronic obstructive pulmonary disease, unspecified COPD type ([place])   4. Sinus tachycardia   5. Uncontrolled type 2 diabetes mellitus with complication, with long-term current use of insulin ([place])    PLAN:    In order  of problems listed above:  1. Acute on chronic diastolic CHF - She is significantly volume overloaded.  She presents to the office today with the  expectation that she is getting admitted for treatment of her CHF.  This appears to be the most appropriate approach.    -  Admit to [facility]  -  DVT dose Lovenox  -  Start Lasix 40 IV bid  -  Monitor renal function closely with IV diuresis  -  Repeat Echo  2. CAD - Minimal plaque on LHC in 2015.  Myoview in 9/17 low risk.  She notes increased pain that she typically has with her COPD.  Suspect this is related to her CHF.  Continue ASA.  Will cycle enzymes.  3. COPD - Continue current management.  4. Sinus tachycardia - Recent workup with Lyda Jester, PA-C.  Holter without significant arrhythmia. Elevated HR felt to be related to albuterol, COPD, tobacco abuse, caffeine use.  Continue calcium channel blocker.   5. Diabetes - Continue current Rx.   Medication Adjustments/Labs and Tests Ordered: Current medicines are reviewed at length with the patient today.  Concerns regarding medicines are outlined above.  Medication changes, Labs and Tests ordered today are outlined in the Patient Instructions noted below. Patient Instructions   YOU ARE BEING TO [place] 3 EAST UNIT ROOM [address] Richardson Dopp, PA-C  05/16/2016 2:35 PM    [facility] [address], [place], [place]  [address] Phone: [phone]; Fax: [phone]

## 2016-05-16 NOTE — Progress Notes (Signed)
New pt direct admission. Pt brought to the floor in stable condition. Vitals taken. Initial Assessment done. All immediate pertinent needs to patient addressed. Patient Guide given to patient. Important safety instructions relating to hospitalization reviewed with patient. Patient verbalized understanding. Will continue to monitor pt.  Maurene Capes RN

## 2016-05-17 ENCOUNTER — Telehealth: Payer: Self-pay | Admitting: Physician Assistant

## 2016-05-17 ENCOUNTER — Inpatient Hospital Stay (HOSPITAL_COMMUNITY): Payer: BLUE CROSS/BLUE SHIELD

## 2016-05-17 DIAGNOSIS — J441 Chronic obstructive pulmonary disease with (acute) exacerbation: Secondary | ICD-10-CM

## 2016-05-17 DIAGNOSIS — Z720 Tobacco use: Secondary | ICD-10-CM

## 2016-05-17 DIAGNOSIS — J410 Simple chronic bronchitis: Secondary | ICD-10-CM

## 2016-05-17 DIAGNOSIS — I2510 Atherosclerotic heart disease of native coronary artery without angina pectoris: Secondary | ICD-10-CM

## 2016-05-17 LAB — BASIC METABOLIC PANEL
Anion gap: 11 (ref 5–15)
BUN: 18 mg/dL (ref 6–20)
CO2: 29 mmol/L (ref 22–32)
Calcium: 9 mg/dL (ref 8.9–10.3)
Chloride: 96 mmol/L — ABNORMAL LOW (ref 101–111)
Creatinine, Ser: 0.88 mg/dL (ref 0.44–1.00)
GFR calc Af Amer: >60 mL/min (ref >60)
GFR calc non Af Amer: >60 mL/min (ref >60)
Glucose, Bld: 305 mg/dL — ABNORMAL HIGH (ref 65–99)
Potassium: 4.1 mmol/L (ref 3.5–5.1)
Sodium: 136 mmol/L (ref 135–145)

## 2016-05-17 LAB — TROPONIN I: Troponin I: <0.03 ng/mL (ref <0.03)

## 2016-05-17 LAB — GLUCOSE, CAPILLARY
Glucose-Capillary: 350 mg/dL — ABNORMAL HIGH (ref 65–99)
Glucose-Capillary: 412 mg/dL — ABNORMAL HIGH (ref 65–99)
Glucose-Capillary: 405 mg/dL — ABNORMAL HIGH (ref 65–99)
Glucose-Capillary: 464 mg/dL — ABNORMAL HIGH (ref 65–99)

## 2016-05-17 LAB — BRAIN NATRIURETIC PEPTIDE: B Natriuretic Peptide: 15.3 pg/mL (ref 0.0–100.0)

## 2016-05-17 MED ORDER — INSULIN REGULAR HUMAN (CONC) 500 UNIT/ML ~~LOC~~ SOPN
50.0000 [IU] | PEN_INJECTOR | Freq: Two times a day (BID) | SUBCUTANEOUS | Status: DC
  Administered 2016-05-17: 50 [IU] via SUBCUTANEOUS
  Filled 2016-05-17: qty 3

## 2016-05-17 MED ORDER — DOXYCYCLINE HYCLATE 100 MG PO TABS
100.0000 mg | ORAL_TABLET | Freq: Two times a day (BID) | ORAL | Status: DC
  Administered 2016-05-17 – 2016-05-18 (×2): 100 mg via ORAL
  Filled 2016-05-17 (×2): qty 1

## 2016-05-17 MED ORDER — MOMETASONE FURO-FORMOTEROL FUM 200-5 MCG/ACT IN AERO
2.0000 | INHALATION_SPRAY | Freq: Two times a day (BID) | RESPIRATORY_TRACT | Status: DC
  Administered 2016-05-17 – 2016-05-18 (×2): 2 via RESPIRATORY_TRACT
  Filled 2016-05-17: qty 8.8

## 2016-05-17 MED ORDER — FUROSEMIDE 40 MG PO TABS
40.0000 mg | ORAL_TABLET | Freq: Every day | ORAL | Status: DC
  Administered 2016-05-18: 40 mg via ORAL
  Filled 2016-05-17: qty 1

## 2016-05-17 MED ORDER — INSULIN ASPART 100 UNIT/ML ~~LOC~~ SOLN
0.0000 [IU] | SUBCUTANEOUS | Status: DC
  Administered 2016-05-18: 20 [IU] via SUBCUTANEOUS
  Administered 2016-05-18: 15 [IU] via SUBCUTANEOUS

## 2016-05-17 MED ORDER — NICOTINE 14 MG/24HR TD PT24
14.0000 mg | MEDICATED_PATCH | Freq: Every day | TRANSDERMAL | Status: DC
  Administered 2016-05-17 – 2016-05-18 (×2): 14 mg via TRANSDERMAL
  Filled 2016-05-17 (×2): qty 1

## 2016-05-17 MED ORDER — INSULIN ASPART 100 UNIT/ML ~~LOC~~ SOLN
20.0000 [IU] | Freq: Once | SUBCUTANEOUS | Status: AC
  Administered 2016-05-17: 20 [IU] via SUBCUTANEOUS

## 2016-05-17 MED ORDER — ZOLPIDEM TARTRATE 5 MG PO TABS
5.0000 mg | ORAL_TABLET | Freq: Every evening | ORAL | Status: DC | PRN
  Administered 2016-05-17: 5 mg via ORAL
  Filled 2016-05-17: qty 1

## 2016-05-17 MED ORDER — INSULIN ASPART 100 UNIT/ML ~~LOC~~ SOLN
15.0000 [IU] | Freq: Once | SUBCUTANEOUS | Status: AC
  Administered 2016-05-17: 15 [IU] via SUBCUTANEOUS

## 2016-05-17 MED ORDER — INSULIN REGULAR HUMAN (CONC) 500 UNIT/ML ~~LOC~~ SOLN
50.0000 [IU] | Freq: Two times a day (BID) | SUBCUTANEOUS | Status: DC
Start: 2016-05-17 — End: 2016-05-17

## 2016-05-17 MED ORDER — PREDNISONE 20 MG PO TABS
40.0000 mg | ORAL_TABLET | Freq: Every day | ORAL | Status: DC
  Administered 2016-05-17: 40 mg via ORAL
  Filled 2016-05-17 (×2): qty 2

## 2016-05-17 NOTE — Consult Note (Signed)
Family Medicine Teaching Service Consult Note Intern Pager: [phone]  Patient name: [name] Medical record number: [id number] Date of birth: [birth year] Age: 46 y.o. Gender: female  Primary Care Provider: Alvester Chou, NP (Not [facility] provider but will consult as unassigned) Consultants: Cardiology Code Status: Full  Pt Overview and Major Events to Date:  11/07: Consult by cardiology for management of diabetes and COPD  Assessment and Plan: #Diabetes Mellitus Type 2, Chronic, Uncontrolled:  Last A1c 12.3 on 05/10/16. Diagnosed since age 52. Takes Humulin R (500 U/mL) 50 U BID with meals, Novolog (100 U/mL) 20 U BID, Amaryl 4 mg BID. Previously tried metformin and glipizide but did not tolerate. Patient checks BG levels twice per day. Fasting levels range 200-400, and is low 100s before dinner, rarely sees lows near 80s. Patient states she started the concentrated Humulin 3 months ago. Does endorse polydipsia. Denies polyuria, polyphagia, change in vision. Diabetes coordinator met with patient and was informed patient takes high concentration of humulin. Patient was educated on diabetes management. --Humulin R (500 U/mL) 50 U BID with meals; may need to titrate up as her blood sugars not well controlled and also starting steroids for COPD exacerbation  --rSSI q4hrs until blood sugars better controlled --Will hold home Amaryl --CBG monitoring --patient mentioned she takes a once weekly injectable; no such medication on med rec or found in chart review. Only other DM medication in med rec is Amaryl.  --hold sulfonylureas in setting of insulin; also has not tolerated in past  #COPD Exacerbation, Acute, Uncontrolled: H/o asthma since child, has chronic bronchitis. Patient has >30 pack year history. Last PFTs 2015: GOLD Classification B based on CAT score of 35 and FEV1 >50%. Smokes 1 ppd currently and interesting in quitting. Says she has talked to her PCP about trying Chantix. Takes albuterol  inhaler daily and about one night per week. Also takes albuterol nebulizer TID for relief. States she takes Advair daily and Spiriva BID. Takes 2L O2 [place] nightly. Patient has diffuse wheeze w/o productive cough or increased work of breathing. Will treat as COPD exacerbation.  --prn albuterol nebulizer q4hrs --continue Spiriva; Advair not on medication list but will add to help better control COPD Ruthe Mannan is formulary alternative) -start prednisone burst for 5 days. Prednisone 3m -will add doxycycline 1018mBID for 5 days -nicotine patch added --[place] O2 to keep sat >/=92% --Cardiac monitoring  #Acute on Chronic Diastolic Heart Failure: Improving, significant diuresis 2.7 L overnight. Weight down. Cardiology managing. No chest pain. No bibasilar crackles. Patient stated baseline weight is 230lbs.  --Cardiology managing --receiving Lasix PO 4037maily -moniotr I/Os  #Hypertension, Chronic, Controlled: Takes ASA, CCB, ACE-I. BP well controlled at 110-120s/50s. --Cardiology managing --continue medications --monitor for hypotension  FEN/GI: Heart healthy diet PPx: Lovenox 40 mg SQ  Disposition: pending improvement of AoC diastolic HF, COPD and DM managment  Subjective:  Patient accompanied by husband. States she feels like her COPD is not well controlled and c/o wheezing. Says she is frustrated not receiving nebulizer treatments fast enough. Denies fever, cough, chest pain, nausea.   Objective: Temp:  [97.6 F (36.4 C)-98.3 F (36.8 C)] 97.7 F (36.5 C) (11/07 1143) Pulse Rate:  [93-101] 97 (11/07 1143) Resp:  [16-19] 16 (11/07 1143) BP: (95-136)/(45-65) 114/51 (11/07 1143) SpO2:  [90 %-96 %] 96 % (11/07 1143) Weight:  [276 lb 8 oz (125.4 kg)] 276 lb 8 oz (125.4 kg) (11/07 0512) Physical Exam: General: obese, well nourished, well developed, in no acute distress with  non-toxic appearance HEENT: normocephalic, atraumatic, moist mucous membranes CV: regular rate and rhythm without  murmurs, rubs, or gallops Lungs: diffuse expiratory wheezing bilaterally with normal work of breathing, no cough Abdomen: soft, non-tender, no masses or organomegaly palpable, normoactive bowel sounds Skin: warm, dry, no rashes or lesions, cap refill < 2 seconds Extremities: warm and well perfused, normal tone  Laboratory:  Recent Labs Lab 05/16/16 1616  WBC 12.2*  HGB 12.8  HCT 37.9  PLT 231    Recent Labs Lab 05/16/16 1616 05/17/16 0330  NA 136 136  K 3.9 4.1  CL 98* 96*  CO2 26 29  BUN 18 18  CREATININE 1.08* 0.88  CALCIUM 9.3 9.0  PROT 8.2*  --   BILITOT 0.7  --   ALKPHOS 118  --   ALT 73*  --   AST 43*  --   GLUCOSE 259* 305*    Imaging/Diagnostic Tests: DG Chest 2 View (05/17/16) FINDINGS: Cardiomediastinal silhouette is stable. No infiltrate or pulmonary edema. Mild perihilar bronchitic changes. Stable tenting of the right hemidiaphragm. Mild degenerative changes thoracic spine pre  IMPRESSION: No infiltrate or pulmonary edema. Mild perihilar bronchitic changes. Tenting of the right hemidiaphragm again noted. Degenerative changes thoracic spine.  ECHO (pending)   David J McMullen, DO 05/17/2016, 3:34 PM PGY-1, [facility] FPTS Intern pager: [phone], text pages welcome  FPTS Upper-Level Resident Addendum  I have independently interviewed and examined the patient. I have discussed the above with the original author and agree with their documentation. My edits for correction/addition/clarification are in pink. Please see also any attending notes.    Y , DO PGY-2, [facility] FPTS Service pager: [phone] (text pages welcome through [facility])   

## 2016-05-17 NOTE — Progress Notes (Signed)
Nutrition Education Note  RD consulted for nutrition education regarding new onset CHF.  RD provided "Heart Failure Nutrition Therapy" handout from the Academy of Nutrition and Dietetics. Pt reports following a carb modified, no added salt diet at home. Reviewed patient's dietary recall. She eats a lot of grilled/baked chicken or fish and canned vegetables that she rinses. She eats eggs and toast for breakfast and typically doesn't eat lunch.  Provided examples on ways to decrease sodium intake in diet. Discouraged intake of processed foods and use of salt shaker. Pt states that she already doesn't add any salt to her food and rarely eats out. Encouraged fresh fruits and vegetables as well as whole grain sources of carbohydrates to maximize fiber intake.   RD discussed why it is important for patient to adhere to diet recommendations, and emphasized the role of fluids, foods to avoid, and importance of weighing self daily. Teach back method used.  Expect good compliance. Pt states that she will need to check the nutrition label of a few products at home.   Body mass index is 47.46 kg/m. Pt meets criteria for Morbid Obesity based on current BMI.  Current diet order is Heart Healthy/Carb Modified, patient is consuming approximately 100% of meals at this time. Labs and medications reviewed. No further nutrition interventions warranted at this time. RD contact information provided. If additional nutrition issues arise, please re-consult RD.   Scarlette Ar RD, CSP, LDN Inpatient Clinical Dietitian Pager: [phone] After Hours Pager: [phone]

## 2016-05-17 NOTE — Progress Notes (Signed)
Inpatient Diabetes Program Recommendations  AACE/ADA: New Consensus Statement on Inpatient Glycemic Control (2015)  Target Ranges:  Prepandial:   less than 140 mg/dL      Peak postprandial:   less than 180 mg/dL (1-2 hours)      Critically ill patients:  140 - 180 mg/dL   Lab Results  Component Value Date   GLUCAP 412 (H) 05/17/2016   HGBA1C 13.4 (H) 11/03/2015    Review of Glycemic Control  Diabetes history: DM2 Outpatient Diabetes medications: U-500 50 units acbid (usually and increase as CBG higher) +Novolog correction 0-20 units acbid+Amaryl 4 mg bid Current orders for Inpatient glycemic control: Amaryl 4 mg+ Novolog correction 0-20 units tid+0-5 units hs  Inpatient Diabetes Program Recommendations:  Spoke with patient @ bedside and patient states she normally takes U-500 insulin 50 units bid and Novolog 20 units acbid Novolog correction which is usually 20 units each time. Please consider: -starting patient on U-500 50 units bid   Patient states her A1c last Tuesday was 12.3 and reviewed what an A1C is, basic pathophysiology of DM Type 2, basic home care, basic diabetes diet nutrition principles, importance of checking CBGs and maintaining good CBG control to prevent long-term and short-term complications. Reviewed signs and symptoms of hyperglycemia and hypoglycemia and how to treat hypoglycemia at home. Also reviewed blood sugar goals at home.  RNs to provide ongoing basic DM education at bedside with this patient.   Thank you, Nani Gasser. Nadina Fomby, RN, MSN, CDE Inpatient Glycemic Control Team Team Pager [phone] (8am-5pm) 05/17/2016 12:12 PM

## 2016-05-17 NOTE — Progress Notes (Signed)
Seen and examined.  Discussed with residents.  I will co sign their note and include my thoughts when it becomes available.  Main issues are  CHF managed by cards. COPD exac  Smoker - states now ready to quit with her husband. Diabetes - type 2 with insulin resistance requiring large doses of insulin both long and short acting.  Also states she is on a once a week injectable that we need to clarify.

## 2016-05-17 NOTE — Telephone Encounter (Signed)
Routed message to Richardson Dopp, [facility]

## 2016-05-17 NOTE — Progress Notes (Signed)
Patient Name: [name] Date of Encounter: 05/17/2016  Primary Cardiologist: Lendell Caprice, MD  Hospital Problem List     Principal Problem:   Acute on chronic diastolic CHF (congestive heart failure) (HCC) Active Problems:   COPD (chronic obstructive pulmonary disease) (HCC)   Sinus tachycardia   Type 2 diabetes mellitus with complication, with long-term current use of insulin (HCC)   Coronary artery disease involving native heart without angina pectoris     Subjective   Feels better. Wants to go home. Breathing improved.   Inpatient Medications    Scheduled Meds: . aspirin EC  81 mg Oral q morning - 10a  . busPIRone  15 mg Oral BID  . diltiazem  240 mg Oral Daily  . DULoxetine  60 mg Oral Daily  . enoxaparin (LOVENOX) injection  40 mg Subcutaneous Q24H  . famotidine  20 mg Oral BID  . FLUoxetine  40 mg Oral Daily  . furosemide  40 mg Intravenous BID  . glimepiride  4 mg Oral BID  . insulin aspart  0-20 Units Subcutaneous TID WC  . insulin aspart  0-5 Units Subcutaneous QHS  . lamoTRIgine  200 mg Oral QHS  . lisinopril  10 mg Oral QHS  . loratadine  10 mg Oral Daily  . potassium chloride  20 mEq Oral BID  . pregabalin  150 mg Oral BID  . QUEtiapine  800 mg Oral QHS  . sodium chloride flush  3 mL Intravenous Q12H  . tiotropium  18 mcg Inhalation Daily   Continuous Infusions:  PRN Meds: sodium chloride, acetaminophen, albuterol, ibuprofen, ondansetron (ZOFRAN) IV, sodium chloride flush   Vital Signs    Vitals:   05/17/16 0730 05/17/16 0805 05/17/16 1018 05/17/16 1036  BP:  (!) 106/53 (!) 95/45 (!) 136/55  Pulse:  95    Resp:  18    Temp:  98.3 F (36.8 C)    TempSrc:  Oral    SpO2: 92% 94%    Weight:      Height:        Intake/Output Summary (Last 24 hours) at 05/17/16 1047 Last data filed at 05/17/16 0942  Gross per 24 hour  Intake              820 ml  Output             3650 ml  Net            -2830 ml   Filed Weights   05/16/16 1530  05/17/16 0512  Weight: 279 lb 4.8 oz (126.7 kg) 276 lb 8 oz (125.4 kg)    Physical Exam   Obese GEN: Well developed, in no acute distress.  HEENT: Grossly normal.  Neck: Supple, no JVD, carotid bruits, or masses. Cardiac: RRR, no murmurs, rubs, or gallops. No clubbing, cyanosis.  Radials/DP/PT 2+ and equal bilaterally. Bilateral lower extremity edema Respiratory:  Respirations regular and unlabored, clear to auscultation bilaterally. GI: Soft, nontender, nondistended, BS + x 4. MS: no deformity or atrophy. Skin: warm and dry, no rash. Neuro:  Strength and sensation are intact. Psych: AAOx3.  Normal affect.  Labs    CBC  Recent Labs  05/16/16 1616  WBC 12.2*  NEUTROABS 8.4*  HGB 12.8  HCT 37.9  MCV 89.0  PLT [id number]   Basic Metabolic Panel  Recent Labs  05/16/16 1616 05/17/16 0330  NA 136 136  K 3.9 4.1  CL 98* 96*  CO2 26 29  GLUCOSE 259* 305*  BUN 18 18  CREATININE 1.08* 0.88  CALCIUM 9.3 9.0   Liver Function Tests  Recent Labs  05/16/16 1616  AST 43*  ALT 73*  ALKPHOS 118  BILITOT 0.7  PROT 8.2*  ALBUMIN 3.4*   No results for input(s): LIPASE, AMYLASE in the last 72 hours. Cardiac Enzymes  Recent Labs  05/16/16 1616 05/16/16 2054 05/17/16 0330  TROPONINI 0.03* <0.03 <0.03   BNP Invalid input(s): POCBNP D-Dimer No results for input(s): DDIMER in the last 72 hours. Hemoglobin A1C No results for input(s): HGBA1C in the last 72 hours. Fasting Lipid Panel No results for input(s): CHOL, HDL, LDLCALC, TRIG, CHOLHDL, LDLDIRECT in the last 72 hours. Thyroid Function Tests No results for input(s): TSH, T4TOTAL, T3FREE, THYROIDAB in the last 72 hours.  Invalid input(s): FREET3  Telemetry    Normal sinus rhythm - Personally Reviewed  ECG    Sinus rhythm/sinus tachycardia with leftward axis and right bundle branch block. - Personally Reviewed  Radiology    Dg Chest 2 View  Result Date: 05/17/2016 CLINICAL DATA:  COPD, asthma EXAM: CHEST  2  VIEW COMPARISON:  11/03/2015 FINDINGS: Cardiomediastinal silhouette is stable. No infiltrate or pulmonary edema. Mild perihilar bronchitic changes. Stable tenting of the right hemidiaphragm. Mild degenerative changes thoracic spine pre IMPRESSION: No infiltrate or pulmonary edema. Mild perihilar bronchitic changes. Tenting of the right hemidiaphragm again noted. Degenerative changes thoracic spine. . Electronically Signed   By: Lahoma Crocker M.D.   On: 05/17/2016 09:24    Cardiac Studies  Holter monitor 02/2016: Sinus rhythm and sinus tachycardia with rates above 150 bpm. Occasional PVCs were noted.  Echocardiogram performed 11/16/15:  Study Conclusions  - Left ventricle: The cavity size was normal. Wall thickness was   increased in a pattern of mild LVH. Systolic function was   vigorous. The estimated ejection fraction was in the range of 65%   to 70%. Features are consistent with a pseudonormal left   ventricular filling pattern, with concomitant abnormal relaxation   and increased filling pressure (grade 2 diastolic dysfunction). - Aortic valve: AV is dfficutl to see well It appears mildly   thickened, mild/moderately calcified with minimally restricted   motion Peak and mean gradients through the valve are 23 and 11 mm   Hg respectively.  Patient Profile     45 year old with history of hypertension, COPD, type 2 diabetes, and chronic diastolic heart failure presents with weight gain and dyspnea felt to be related to acute on chronic diastolic heart failure. Prior ischemic evaluation negative for significant obstructive CAD. Recent switch in therapy from beta blocker to diltiazem attempting to control heart rate.  Assessment & Plan    1. Acute on chronic diastolic heart failure improved with significant diuresis overnight, negative approximately 2700 cc. Weight down 3 pounds. Needs more diuresis. 2. COPD, severity is unknown. Has had spirometry in the past but not interpreted in  reports. 3. Essential hypertension, low salt diet is advised. 4. Diabetes mellitus, poorly controlled. Check A1c. Will get her primary physicians, Family Practice Service to weigh in on her noncardiac problems and modify therapy if needed.  Signed, Sinclair Grooms, MD  05/17/2016, 10:47 AM

## 2016-05-17 NOTE — Progress Notes (Signed)
Patient had a capillary blood glucose level of 464, MD notified, 15 units of Insulin ordered and given.

## 2016-05-17 NOTE — Telephone Encounter (Signed)
New Message:    Carol,would you make sure Scott call Judy Hanks at [facility]. She is The Diabetes Coordinator,she needs to tal to him about this pt,Kristain Pizzolato.

## 2016-05-17 NOTE — Progress Notes (Signed)
Pt's BS 412mg /dl notified D. Dunn instructed will put in order.  Will continue to monitor.  Karie Kirks, Therapist, sports.

## 2016-05-17 NOTE — Progress Notes (Signed)
Pt's blood sugar running high. Recent A1C >12. Will re-order home U 500. Give 20u of SSI insulin now. Appreciate IM input for help with DM as well. Wannetta Sender is calling consult.).  Talissa Apple PA-C

## 2016-05-18 ENCOUNTER — Other Ambulatory Visit (HOSPITAL_COMMUNITY): Payer: BLUE CROSS/BLUE SHIELD

## 2016-05-18 ENCOUNTER — Telehealth: Payer: Self-pay | Admitting: Physician Assistant

## 2016-05-18 DIAGNOSIS — I10 Essential (primary) hypertension: Secondary | ICD-10-CM

## 2016-05-18 DIAGNOSIS — I5043 Acute on chronic combined systolic (congestive) and diastolic (congestive) heart failure: Secondary | ICD-10-CM

## 2016-05-18 DIAGNOSIS — J440 Chronic obstructive pulmonary disease with acute lower respiratory infection: Secondary | ICD-10-CM

## 2016-05-18 LAB — GLUCOSE, CAPILLARY
Glucose-Capillary: 320 mg/dL — ABNORMAL HIGH (ref 65–99)
Glucose-Capillary: 376 mg/dL — ABNORMAL HIGH (ref 65–99)
Glucose-Capillary: 483 mg/dL — ABNORMAL HIGH (ref 65–99)
Glucose-Capillary: 500 mg/dL — ABNORMAL HIGH (ref 65–99)

## 2016-05-18 LAB — BASIC METABOLIC PANEL
Anion gap: 8 (ref 5–15)
BUN: 19 mg/dL (ref 6–20)
CO2: 29 mmol/L (ref 22–32)
Calcium: 9.4 mg/dL (ref 8.9–10.3)
Chloride: 95 mmol/L — ABNORMAL LOW (ref 101–111)
Creatinine, Ser: 1.12 mg/dL — ABNORMAL HIGH (ref 0.44–1.00)
GFR calc Af Amer: >60 mL/min (ref >60)
GFR calc non Af Amer: 58 mL/min — ABNORMAL LOW (ref >60)
Glucose, Bld: 332 mg/dL — ABNORMAL HIGH (ref 65–99)
Potassium: 4.8 mmol/L (ref 3.5–5.1)
Sodium: 132 mmol/L — ABNORMAL LOW (ref 135–145)

## 2016-05-18 MED ORDER — PREDNISONE 20 MG PO TABS: 40.0000 mg | ORAL_TABLET | Freq: Every day | ORAL | 0 refills | Status: AC

## 2016-05-18 MED ORDER — FUROSEMIDE 40 MG PO TABS: 40.0000 mg | ORAL_TABLET | Freq: Two times a day (BID) | ORAL | Status: DC

## 2016-05-18 MED ORDER — POTASSIUM CHLORIDE CRYS ER 20 MEQ PO TBCR: 20.0000 meq | EXTENDED_RELEASE_TABLET | Freq: Every day | ORAL | 6 refills | Status: DC

## 2016-05-18 MED ORDER — INSULIN REGULAR HUMAN (CONC) 500 UNIT/ML ~~LOC~~ SOPN
60.0000 [IU] | PEN_INJECTOR | Freq: Two times a day (BID) | SUBCUTANEOUS | Status: DC
Start: 2016-05-18 — End: 2016-05-18
  Administered 2016-05-18: 60 [IU] via SUBCUTANEOUS

## 2016-05-18 MED ORDER — FUROSEMIDE 40 MG PO TABS: 40.0000 mg | ORAL_TABLET | Freq: Two times a day (BID) | ORAL | 6 refills | Status: DC

## 2016-05-18 MED ORDER — DOXYCYCLINE HYCLATE 100 MG PO TABS: 100.0000 mg | ORAL_TABLET | Freq: Two times a day (BID) | ORAL | 0 refills | Status: AC

## 2016-05-18 MED ORDER — INSULIN REGULAR HUMAN (CONC) 500 UNIT/ML ~~LOC~~ SOPN
60.0000 [IU] | PEN_INJECTOR | Freq: Once | SUBCUTANEOUS | Status: AC
  Administered 2016-05-18: 60 [IU] via SUBCUTANEOUS

## 2016-05-18 NOTE — Telephone Encounter (Signed)
Toc phone call .Marland KitchenMarland KitchenMarland Kitchenappointment on Nov 14 @8 :30am with Eugenia Mcalpine Thanks

## 2016-05-18 NOTE — Progress Notes (Signed)
Patient had a capillary blood glucose of 376 around 0400. Glucometer did not transfer data. 20 units of Novolog given.

## 2016-05-18 NOTE — Consult Note (Signed)
Family Medicine Teaching Service Consult Note Intern Pager: [phone]  Patient name: [name] Medical record number: [id number] Date of birth: 1970-08-21 Age: 45 y.o. Gender: female  Primary Care Provider: Alvester Chou, NP (Not [facility] provider but will consult as unassigned) Consultants: Cardiology Code Status: Full  Pt Overview and Major Events to Date:  11/07: Consult by cardiology for management of diabetes and COPD  Assessment and Plan: #Diabetes Mellitus Type 2, Chronic, Uncontrolled:  Last A1c 12.3 on 05/10/16. Diagnosed since age 54. Takes Humulin R (500 U/mL) 50 U BID with meals, Novolog (100 U/mL) 20 U BID, Amaryl 4 mg BID. Previously tried metformin and glipizide but did not tolerate. Patient checks BG levels twice per day. Fasting levels range 200-400, and is low 100s before dinner, rarely sees lows near 80s. Patient states she started the concentrated Humulin 3 months ago. Does endorse polydipsia. Denies polyuria, polyphagia, change in vision. Diabetes coordinator met with patient and was informed patient takes high concentration of humulin. Patient was educated on diabetes management. Contacted PCP and determined patient takes Bydureon weekly which patient took last Friday. She also takes 100-200 U of her Humulin R 500 and Novolog 55 at home. --Humulin R (500 U/mL) 50 U BID with meals; may need to titrate up as her blood sugars not well controlled and also starting steroids for COPD exacerbation  --rSSI q4hrs until blood sugars better controlled --Will hold home Amaryl --CBG monitoring --Patient mentioned she takes a once weekly injectable; no such medication on med rec or found in chart review, only other DM medication in med rec is Amaryl --Hold sulfonylureas in setting of insulin; also has not tolerated in past  #COPD Exacerbation, Acute, Improved: H/o asthma since child, has chronic bronchitis. Patient has >30 pack year history. Last PFTs 2015: GOLD Classification B based on  CAT score of 35 and FEV1 >50%. Smokes 1 ppd currently and interesting in quitting. Says she has talked to her PCP about trying Chantix. Takes albuterol inhaler daily and about one night per week. Also takes albuterol nebulizer TID for relief. States she takes Advair daily and Spiriva BID. Takes 2L O2 [place] nightly. Lung exam improved with air movement bilaterally w/o wheeze. Continue to treat as COPD exacerbation.  --Albuterol nebulizer q4hrs PRN --Continue Spiriva --Dulera 2 puffs BID --Start prednisone burst for 5 days, prednisone 100m (day 2) --Will add doxycycline 1047mBID for 5 days (day 2) --Nicotine patch added --[place] O2 to keep sat >/=92% --Cardiac monitoring  #Acute on Chronic Diastolic Heart Failure: Improving, significant diuresis 2.7 L overnight. Weight down. Cardiology managing. No chest pain. No bibasilar crackles. Patient stated baseline weight is 230lbs. UO -5.1L last 24 hrs, weight down 5 lbs since yesterday. --Cardiology managing --Receiving Lasix PO 4031maily --Moniotr I/Os  #Hypertension, Chronic, Controlled: Takes ASA, CCB, ACE-I. BP well controlled at 110-120s/50s. --Cardiology managing --Continue medications --Monitor for hypotension  FEN/GI: Heart healthy diet PPx: Lovenox 40 mg SQ  Disposition: pending improvement of AoC diastolic HF, COPD and DM managment  Subjective:  Patient sitting up on end of bed. Says she is ready to go and her COPD is controlled now. She complains we are not controlling her diabetes and says she "can take care of her sugar levels at home." Denies fever, cough, chest pain, nausea.   Objective: Temp:  [97.7 F (36.5 C)-99.3 F (37.4 C)] 98.5 F (36.9 C) (11/08 0429) Pulse Rate:  [92-97] 92 (11/08 0429) Resp:  [16-20] 20 (11/08 0429) BP: (95-156)/(45-71) 131/71 (11/08 0429)  SpO2:  [92 %-97 %] 97 % (11/08 0429) Weight:  [271 lb 6.4 oz (123.1 kg)] 271 lb 6.4 oz (123.1 kg) (11/08 0429) Physical Exam: General: obese, well nourished,  well developed, in no acute distress with non-toxic appearance HEENT: normocephalic, atraumatic, moist mucous membranes CV: regular rate and rhythm without murmurs, rubs, or gallops Lungs: clear to ascultation bilaterally, no wheeze with normal work of breathing, no cough Abdomen: soft, non-tender, no masses or organomegaly palpable, normoactive bowel sounds Skin: warm, dry, no rashes or lesions, cap refill < 2 seconds Extremities: warm and well perfused, normal tone  Laboratory:  Recent Labs Lab 05/16/16 1616  WBC 12.2*  HGB 12.8  HCT 37.9  PLT 231    Recent Labs Lab 05/16/16 1616 05/17/16 0330 05/18/16 0328  NA 136 136 132*  K 3.9 4.1 4.8  CL 98* 96* 95*  CO2 26 29 29   BUN 18 18 19   CREATININE 1.08* 0.88 1.12*  CALCIUM 9.3 9.0 9.4  PROT 8.2*  --   --   BILITOT 0.7  --   --   ALKPHOS 118  --   --   ALT 73*  --   --   AST 43*  --   --   GLUCOSE 259* 305* 332*    Imaging/Diagnostic Tests: DG Chest 2 View (05/17/16) FINDINGS: Cardiomediastinal silhouette is stable. No infiltrate or pulmonary edema. Mild perihilar bronchitic changes. Stable tenting of the right hemidiaphragm. Mild degenerative changes thoracic spine pre  IMPRESSION: No infiltrate or pulmonary edema. Mild perihilar bronchitic changes. Tenting of the right hemidiaphragm again noted. Degenerative changes thoracic spine.  ECHO (pending)   [place] Bing, DO 05/18/2016, 7:23 AM PGY-1, [place] Intern pager: [phone], text pages welcome

## 2016-05-18 NOTE — Discharge Summary (Signed)
Discharge Summary    Patient ID: [name],  MRN: [id number], DOB/AGE: 10/04/1970 45 y.o.  Admit date: 05/16/2016 Discharge date: 05/18/2016  Primary Care Provider: Alvester Chou Primary Cardiologist: Dr. Irish Lack  Discharge Diagnoses    Principal Problem:   Acute on chronic diastolic CHF (congestive heart failure) ([place]) Active Problems:   COPD (chronic obstructive pulmonary disease) ([place])   Sinus tachycardia   Type 2 diabetes mellitus with complication, with long-term current use of insulin (HCC)   Coronary artery disease involving native heart without angina pectoris   Simple chronic bronchitis ([place])   History of Present Illness     [name] is a 45 y.o. female with past medical history of nonobstructive CAD (by cath in 11/2013, low-risk NST 03/2016), chronic diastolic CHF, known RBBB, COPD, tobacco use, and uncontrolled IDDM who presented to the office on 05/16/2016 for significant weight gain, lower extremity edema, and dyspnea with exertion.   Had been seen earlier that day by her PCP and informed to go to the hospital but instead presented to the clinic. On examination, she was significantly volume overloaded and was directly admitted to [facility] for IV diuresis and further management of her CHF exacerbation.  Hospital Course     Consultants: Family Medicine   She was started on IV Lasix 40mg  BID with over -2.8L net output during the first day and weight down 3 lbs. Reported breathing was close to baseline but she still appeared volume overloaded by examination.    During the admission, fasting glucose readings were elevated to 350+ and recent A1c was reported to be 12.3. The assistance of the Diabetes Coordinator was appreciated with this. Family Medicine was consulted as well and their recommendations were noted. At the time of discharge, they advised to restart her PTA medications and arrange follow-up with her PCP within the next 2 days.  She was also treated  for a COPD Exacerbation which consisted of Prednisone 40mg  daily for 5 days and Doxycycline 100mg  BID for 5 days. She was continued on her PTA Spiriva.    On 05/18/2016, the patient reported feeling significantly better and requested to be discharged. Was overall -6.3L since admission with a current weight of 271 lbs, down 8 lbs since admission.   She was last examined by Dr. Tamala Julian and deemed stable for discharge. She will be on Lasix 40mg  BID as an outpatient. A 7-day TOC appointment has been arranged, as her medication may require further dose adjustment at that time. She was discharged home in stable condition. _____________  Discharge Vitals Blood pressure 117/64, pulse (!) 102, temperature 98.5 F (36.9 C), temperature source Oral, resp. rate 18, height 5\' 4"  (1.626 m), weight 271 lb 6.4 oz (123.1 kg), last menstrual period 11/11/2011, SpO2 94 %.  Filed Weights   05/16/16 1530 05/17/16 0512 05/18/16 0429  Weight: 279 lb 4.8 oz (126.7 kg) 276 lb 8 oz (125.4 kg) 271 lb 6.4 oz (123.1 kg)    Labs & Radiologic Studies     CBC  Recent Labs  05/16/16 1616  WBC 12.2*  NEUTROABS 8.4*  HGB 12.8  HCT 37.9  MCV 89.0  PLT [id number]   Basic Metabolic Panel  Recent Labs  05/17/16 0330 05/18/16 0328  NA 136 132*  K 4.1 4.8  CL 96* 95*  CO2 29 29  GLUCOSE 305* 332*  BUN 18 19  CREATININE 0.88 1.12*  CALCIUM 9.0 9.4   Liver Function Tests  Recent Labs  05/16/16 1616  AST 43*  ALT 73*  ALKPHOS 118  BILITOT 0.7  PROT 8.2*  ALBUMIN 3.4*   No results for input(s): LIPASE, AMYLASE in the last 72 hours. Cardiac Enzymes  Recent Labs  05/16/16 1616 05/16/16 2054 05/17/16 0330  TROPONINI 0.03* <0.03 <0.03   BNP Invalid input(s): POCBNP D-Dimer No results for input(s): DDIMER in the last 72 hours. Hemoglobin A1C No results for input(s): HGBA1C in the last 72 hours. Fasting Lipid Panel No results for input(s): CHOL, HDL, LDLCALC, TRIG, CHOLHDL, LDLDIRECT in the last 72  hours. Thyroid Function Tests No results for input(s): TSH, T4TOTAL, T3FREE, THYROIDAB in the last 72 hours.  Invalid input(s): FREET3  Dg Chest 2 View  Result Date: 05/17/2016 CLINICAL DATA:  COPD, asthma EXAM: CHEST  2 VIEW COMPARISON:  11/03/2015 FINDINGS: Cardiomediastinal silhouette is stable. No infiltrate or pulmonary edema. Mild perihilar bronchitic changes. Stable tenting of the right hemidiaphragm. Mild degenerative changes thoracic spine pre IMPRESSION: No infiltrate or pulmonary edema. Mild perihilar bronchitic changes. Tenting of the right hemidiaphragm again noted. Degenerative changes thoracic spine. . Electronically Signed   By: Lahoma Crocker M.D.   On: 05/17/2016 09:24     Diagnostic Studies/Procedures    None Performed.    Disposition   Pt is being discharged home today in good condition.  Follow-up Plans & Appointments    Follow-up Information    Alvester Chou, NP.   Specialty:  Nurse Practitioner Contact information: [facility] [place] [address] [phone]        Charlie Pitter, PA-C On 05/24/2016.   Specialties:  Cardiology, Radiology Why:  Cardiology Hospital Follow-Up on 05/24/2016 at 8:30AM.  Contact information: [address] [place] [address] [phone]          Discharge Instructions    Increase activity slowly    Complete by:  As directed       Discharge Medications     Medication List    TAKE these medications   ALPRAZolam 1 MG tablet Commonly known as:  XANAX Take 0.5 mg by mouth at bedtime as needed for anxiety.   aspirin EC 81 MG tablet Take 81 mg by mouth every morning.   busPIRone 15 MG tablet Commonly known as:  BUSPAR Take 15 mg by mouth 2 (two) times daily. [name]   diltiazem 240 MG 24 hr capsule Commonly known as:  CARDIZEM CD Take 1 capsule (240 mg total) by mouth daily. To replace Toprol   doxycycline 100 MG tablet Commonly known as:   VIBRA-TABS Take 1 tablet (100 mg total) by mouth every 12 (twelve) hours.   DULoxetine 60 MG capsule Commonly known as:  CYMBALTA Take 60 mg by mouth daily.   famotidine 20 MG tablet Commonly known as:  PEPCID Take 20 mg by mouth 2 (two) times daily.   furosemide 40 MG tablet Commonly known as:  LASIX Take 1 tablet (40 mg total) by mouth 2 (two) times daily.   glimepiride 4 MG tablet Commonly known as:  AMARYL Take 4 mg by mouth 2 (two) times daily. Dr Alvester Chou   ibuprofen 200 MG tablet Commonly known as:  ADVIL,MOTRIN Take 600 mg by mouth 2 (two) times daily as needed for moderate pain.   insulin aspart 100 UNIT/ML injection Commonly known as:  novoLOG Inject 20 Units into the skin 2 (two) times daily. Alvester Chou   insulin regular human CONCENTRATED 500 UNIT/ML injection Commonly known  as:  HUMULIN R Inject 50 Units into the skin 2 (two) times daily with a meal. Patient states takes 50 units U-500 bid   lamoTRIgine 200 MG tablet Commonly known as:  LAMICTAL Take 200 mg by mouth at bedtime.   lisinopril 10 MG tablet Commonly known as:  PRINIVIL,ZESTRIL Take 1 tablet (10 mg total) by mouth at bedtime. Notes to patient:  05/18/2016   loratadine 10 MG tablet Commonly known as:  CLARITIN Take 1 tablet (10 mg total) by mouth daily.   OXYGEN Inhale 2 L into the lungs at bedtime.   PARI SOFT PLASTIC ADULT MASK Misc Use with oxygen, To be titrated by respiratory therapist   potassium chloride SA 20 MEQ tablet Commonly known as:  K-DUR,KLOR-CON Take 1 tablet (20 mEq total) by mouth daily.   predniSONE 20 MG tablet Commonly known as:  DELTASONE Take 2 tablets (40 mg total) by mouth daily with breakfast. Start taking on:  05/19/2016   pregabalin 150 MG capsule Commonly known as:  LYRICA Take 1 capsule (150 mg total) by mouth 2 (two) times daily.   PROVENTIL HFA 108 (90 Base) MCG/ACT inhaler Generic drug:  albuterol INHALE TWO (2) PUFFS EVERY FOUR HOURS ASNEEDED  FOR SHORTNESS OF BREATH   PROZAC 20 MG capsule Generic drug:  FLUoxetine Take 40 mg by mouth daily.   QUEtiapine 400 MG 24 hr tablet Commonly known as:  SEROQUEL XR Take 800 mg by mouth at bedtime.   SPIRIVA RESPIMAT 2.5 MCG/ACT Aers Generic drug:  Tiotropium Bromide Monohydrate INHALE 2 PUFFS INTO THE LUNGS DAILY.         Allergies Allergies  Allergen Reactions  . Metoprolol Shortness Of Breath    Occurrence of shortness of breath after 3 days  . Montelukast Sodium Shortness Of Breath    ER visit for swollen throat  . Morphine Sulfate Shortness Of Breath and Nausea And Vomiting    Swollen Throat - Able to tolerate dilaudid  . Penicillins Hives and Shortness Of Breath    Swelling on throat Has patient had a PCN reaction causing immediate rash, facial/tongue/throat swelling, SOB or lightheadedness with hypotension: Yes Has patient had a PCN reaction causing severe rash involving mucus membranes or skin necrosis: No Has patient had a PCN reaction that required hospitalization Yes Has patient had a PCN reaction occurring within the last 10 years: No If all of the above answers are "NO", then may proceed with Cephalosporin use.   . Gabapentin Swelling     Outstanding Labs/Studies   BMET at follow-up appointment  Duration of Discharge Encounter   Greater than 30 minutes including physician time.  Signed, Erma Heritage, PA-C 05/18/2016, 5:49 PM   The patient has been seen in conjunction with [place], PA-C. All aspects of care have been considered and discussed. The patient has been personally interviewed, examined, and all clinical data has been reviewed.   Significant diuresis since admission. The patient is being discharged on oral diuretic therapy. We need to excess the intensity of therapy with a basic metabolic panel within the next 5-10 days and make adjustments if necessary.  She will need close tight management of risk factors including her  diabetes, lipids, and blood pressure.

## 2016-05-18 NOTE — Progress Notes (Signed)
Patient Name: [name] Date of Encounter: 05/18/2016  Primary Cardiologist: Lendell Caprice, MD  Hospital Problem List     Principal Problem:   Acute on chronic combined systolic and diastolic congestive heart failure (HCC) Active Problems:   COPD (chronic obstructive pulmonary disease) (HCC)   Sinus tachycardia   Type 2 diabetes mellitus with complication, with long-term current use of insulin (HCC)   Coronary artery disease involving native heart without angina pectoris   Simple chronic bronchitis ([place])     Subjective   Requesting discharge. States she feels back to baseline.  Inpatient Medications    Scheduled Meds: . aspirin EC  81 mg Oral q morning - 10a  . busPIRone  15 mg Oral BID  . diltiazem  240 mg Oral Daily  . doxycycline  100 mg Oral Q12H  . DULoxetine  60 mg Oral Daily  . enoxaparin (LOVENOX) injection  40 mg Subcutaneous Q24H  . famotidine  20 mg Oral BID  . FLUoxetine  40 mg Oral Daily  . furosemide  40 mg Oral Daily  . insulin aspart  0-20 Units Subcutaneous Q4H  . insulin aspart  0-5 Units Subcutaneous QHS  . insulin regular human CONCENTRATED  60 Units Subcutaneous BID WC  . insulin regular human CONCENTRATED  60 Units Subcutaneous Once  . lamoTRIgine  200 mg Oral QHS  . lisinopril  10 mg Oral QHS  . loratadine  10 mg Oral Daily  . mometasone-formoterol  2 puff Inhalation BID  . nicotine  14 mg Transdermal Daily  . potassium chloride  20 mEq Oral BID  . predniSONE  40 mg Oral Q breakfast  . pregabalin  150 mg Oral BID  . QUEtiapine  800 mg Oral QHS  . sodium chloride flush  3 mL Intravenous Q12H  . tiotropium  18 mcg Inhalation Daily   Continuous Infusions:  PRN Meds: sodium chloride, acetaminophen, albuterol, ibuprofen, ondansetron (ZOFRAN) IV, sodium chloride flush, zolpidem   Vital Signs    Vitals:   05/17/16 1744 05/17/16 2205 05/18/16 0429 05/18/16 0830  BP: (!) 156/66 (!) 130/58 131/71   Pulse: 96 96 92   Resp:  20 20   Temp:   99.3 F (37.4 C) 98.5 F (36.9 C)   TempSrc:  Oral Oral   SpO2:  94% 97% 96%  Weight:   271 lb 6.4 oz (123.1 kg)   Height:        Intake/Output Summary (Last 24 hours) at 05/18/16 1057 Last data filed at 05/18/16 0900  Gross per 24 hour  Intake              993 ml  Output             4500 ml  Net            -3507 ml   Filed Weights   05/16/16 1530 05/17/16 0512 05/18/16 0429  Weight: 279 lb 4.8 oz (126.7 kg) 276 lb 8 oz (125.4 kg) 271 lb 6.4 oz (123.1 kg)    Physical Exam    GEN: Obese, well developed, in no acute distress.  HEENT: Grossly normal.  Neck: Supple, no JVD, carotid bruits, or masses. Cardiac: RRR, no murmurs, rubs, or gallops. No clubbing, cyanosis, edema.  Radials/DP/PT 2+ and equal bilaterally.  Respiratory:  Respirations regular and unlabored, clear to auscultation bilaterally. GI: Soft, nontender, nondistended, BS + x 4. MS: no deformity or atrophy. Skin: warm and dry, no rash. Neuro:  Strength and sensation are  intact. Psych: AAOx3.  Normal affect.  Labs    CBC  Recent Labs  05/16/16 1616  WBC 12.2*  NEUTROABS 8.4*  HGB 12.8  HCT 37.9  MCV 89.0  PLT [id number]   Basic Metabolic Panel  Recent Labs  05/17/16 0330 05/18/16 0328  NA 136 132*  K 4.1 4.8  CL 96* 95*  CO2 29 29  GLUCOSE 305* 332*  BUN 18 19  CREATININE 0.88 1.12*  CALCIUM 9.0 9.4   Liver Function Tests  Recent Labs  05/16/16 1616  AST 43*  ALT 73*  ALKPHOS 118  BILITOT 0.7  PROT 8.2*  ALBUMIN 3.4*   No results for input(s): LIPASE, AMYLASE in the last 72 hours. Cardiac Enzymes  Recent Labs  05/16/16 1616 05/16/16 2054 05/17/16 0330  TROPONINI 0.03* <0.03 <0.03     Telemetry    Normal sinus rhythm - Personally Reviewed  ECG    No new data. ECG yesterday with right bundle branch block and left axis deviation. - Personally Reviewed  Radiology    Dg Chest 2 View  Result Date: 05/17/2016 CLINICAL DATA:  COPD, asthma EXAM: CHEST  2 VIEW COMPARISON:   11/03/2015 FINDINGS: Cardiomediastinal silhouette is stable. No infiltrate or pulmonary edema. Mild perihilar bronchitic changes. Stable tenting of the right hemidiaphragm. Mild degenerative changes thoracic spine pre IMPRESSION: No infiltrate or pulmonary edema. Mild perihilar bronchitic changes. Tenting of the right hemidiaphragm again noted. Degenerative changes thoracic spine. . Electronically Signed   By: Lahoma Crocker M.D.   On: 05/17/2016 09:24    Cardiac Studies   No new cardiac studies.  Patient Profile     45 year old with history of hypertension, COPD (unknown severity), type 2 diabetes, and chronic diastolic heart failure presents with weight gain and dyspnea felt to be related to acute on chronic diastolic heart failure. Prior ischemic evaluation negative for significant obstructive CAD. Recent switch in therapy from beta blocker to diltiazem attempting to control heart rate.  Assessment & Plan    1. Acute on chronic diastolic heart failure improved with significant diuresis . Weight is down 8 pounds and patient is -6.5 L since admission.  Ready for discharge from the cardiac standpoint with our addition to therapy being chronic daily diuretic therapy. We will start with 40 mg twice a day. She will need to have transition of care follow-up in cardiology 5-7 days from now with basic metabolic panel to assess potassium and kidney function and re-evaluate diuretic requirement. We should arrange for her to weigh herself daily. We are trying to establish a dry weight.  2. COPD, severity is unknown. Has had spirometry in the past but not interpreted in reports. Appreciate family practice consult. Note the steroids have been started for COPD exacerbation. 3. Essential hypertension, low salt diet is advised. 4. Diabetes mellitus, poorly controlled. Check A1c. Appreciate input from family practice service. Blood sugars will be a little more difficult to control now that prednisone is been  started.   Signed, Sinclair Grooms, MD  05/18/2016, 10:57 AM

## 2016-05-18 NOTE — Progress Notes (Signed)
Pt. given discharge instructions, questions answered. CBG 500 Pt stated she wants to go home and will handle it. IV and tele taken out.  Taken out via wheelchair.

## 2016-05-19 DIAGNOSIS — F3131 Bipolar disorder, current episode depressed, mild: Secondary | ICD-10-CM | POA: Diagnosis not present

## 2016-05-19 NOTE — Telephone Encounter (Signed)
Patient contacted regarding discharge from [facility] on 05/18/2016.  Patient understands to follow up with provider Melina Copa, PA-C on 05/24/2016 at 0830 at [facility] office . Patient understands discharge instructions? Yes  Patient understands medications and regiment? Yes  Patient understands to bring all medications to this visit? Yes  I offered to review [name] medication, in detail with her, she refused, states she is driving husband to work. I advised her if she would like to call back she can, otherwise, will see her on the 14th. She voiced understanding and thanks.

## 2016-05-19 NOTE — Telephone Encounter (Signed)
Follow up ° °Pt voiced returning nurses call. ° °Please f/u °

## 2016-05-19 NOTE — Telephone Encounter (Signed)
LMTCB

## 2016-05-23 ENCOUNTER — Encounter: Payer: Self-pay | Admitting: Physician Assistant

## 2016-05-23 NOTE — Progress Notes (Addendum)
Cardiology Office Note    Date:  05/24/2016  ID:  [name], DOB February 24, 1971, MRN [id number] PCP:  Alvester Chou, NP  Cardiologist:  Dr. Irish Lack 2015   Chief Complaint: f/u CHF  History of Present Illness:  [name] is a 45 y.o. female with history of mild-mod CAD 123456, chronic diastolic CHF, suspected CKD stage II per labs, tobacco abuse, COPD on nocturnal O2, stroke age 61-19, seizures, peripheral neuropathy, HTN, hyperlipidemia, hypertriglyceridemia, anxiety, GERD, persistent sinus tachycardia, uncontrolled DM who presents to f/u HF.   To recap hx, LHC 11/2013 done for CP/fluid retention showed mild disease in prox LAD, mild-mod disease in mRCA, EF 60% with normal LVEDP. TEE 11/2013 (done for aortic valve calcification on 2D echo) showed small area of calcification on LCC, no veg, no AS/AI, normal LVF, question of small left to right flow by color but no right to left flow by bubble study. F/u echo 11/16/15 showed mild LVH, EF 65-70%, grade 2 DD, AV mildly thickened, mild-mod calcified with minimally restricted motion. She saw Lyda Jester PA-C 02/2016 to evaluate persistently elevated HR - average HR ranging 90s-120s per review in Epic since 2009 (regularly >100) - TSH 0.56, WBC 15.2 (frequently elevated), Hgb 14.6, Na 132 (pseudohypoNa given glucose 355). 48 hour Holter showed average HR 107bpm, minimum 80, max 144, one 2.6 sec pause at 2:39am, no sig arrhythmias otherwise. I saw her in f/u 03/09/16 at which time her persistent tach was felt 2/2 her COPD, wheezing, caffeine use and Seroquel which can cause tachycardia. She was switched from beta blocker to diltiazem. Nuc 03/2016 was negative for ischemia, EF 64%. She was admitted 11/6-11/8/17 with acute on chronic diastolic CHF and COPD exacerbation, requiring treatment with IV Lasix, steroids and antibiotics. She diuresed 8lb with DC weight of 271lb. Last labs 05/18/16: Na 132 (glucose 332), Cr 1.12, trop neg, BNP 20.6, WBC 12.2, Hgb 12.8,  trop 0.03.  She returns for follow-up today. She noticed her SOB came back as soon as she finished her prednisone. Remains SOB with any level of exertion. Her husband also does not think he was hooking up her nocturnal O2 right. Her weight is up in the office but she states overall her fluid status is much better. Her LEE is much improved - states her legs were prevously tree trunks. She continues to smoke. Very heavy odor of tobacco in exam room. No CP or syncope.  Past Medical History:  Diagnosis Date  . Anxiety    takes Prozac daily  . Aortic valve calcification   . Asthma    Advair and Spirva daily  . CAD (coronary artery disease)    a. LHC 11/2013 done for CP/fluid retention: mild disease in prox LAD, mild-mod disease in mRCA, EF 60% with normal LVEDP. b. Normal nuc 03/2016.  Marland Kitchen Chronic diastolic CHF (congestive heart failure) ([place])   . CKD (chronic kidney disease), stage II   . COPD (chronic obstructive pulmonary disease) (HCC)    a. nocturnal O2.  . Diabetes mellitus   . Family history of adverse reaction to anesthesia    mom gets nauseated  . GERD (gastroesophageal reflux disease)    takes Pepcid daily  . History of blood clots    left leg 3-33yrs ago  . Hyperlipidemia   . Hypertension   . Hypertriglyceridemia   . Inguinal hernia, left 01/2015  . Muscle spasm   . Peripheral neuropathy ([place])   . RBBB   . Seizures ([place])   . Sinus  tachycardia    a. persistent since 2009.  Marland Kitchen Smokers' cough ([place])   . Stroke [facility]) 1989   left sided weakness  . Tobacco abuse     Past Surgical History:  Procedure Laterality Date  . INGUINAL HERNIA REPAIR Left 04/08/2015   Procedure: OPEN LEFT INGUINAL HERNIA REPAIR WITH MESH;  Surgeon: Ralene Ok, MD;  Location: [place];  Service: General;  Laterality: Left;  . INSERTION OF MESH Left 04/08/2015   Procedure: INSERTION OF MESH;  Surgeon: Ralene Ok, MD;  Location: [place];  Service: General;  Laterality: Left;  . LAPAROSCOPY      Endometriosis  . LEFT HEART CATHETERIZATION WITH CORONARY ANGIOGRAM N/A 12/05/2013   Procedure: LEFT HEART CATHETERIZATION WITH CORONARY ANGIOGRAM;  Surgeon: Jettie Booze, MD;  Location: [facility] CATH LAB;  Service: Cardiovascular;  Laterality: N/A;  . right kidney drained    . TEE WITHOUT CARDIOVERSION N/A 11/28/2013   Procedure: TRANSESOPHAGEAL ECHOCARDIOGRAM (TEE);  Surgeon: Thayer Headings, MD;  Location: [facility] ENDOSCOPY;  Service: Cardiovascular;  Laterality: N/A;    Current Medications: Current Outpatient Prescriptions  Medication Sig Dispense Refill  . ALPRAZolam (XANAX) 1 MG tablet Take 0.5 mg by mouth at bedtime as needed for anxiety.    Marland Kitchen aspirin EC 81 MG tablet Take 81 mg by mouth every morning.    . busPIRone (BUSPAR) 15 MG tablet Take 15 mg by mouth 2 (two) times daily. [name]    . diltiazem (CARDIZEM CD) 240 MG 24 hr capsule Take 1 capsule (240 mg total) by mouth daily. To replace Toprol 30 capsule 3  . DULoxetine (CYMBALTA) 60 MG capsule Take 60 mg by mouth daily.    . famotidine (PEPCID) 20 MG tablet Take 20 mg by mouth 2 (two) times daily.    Marland Kitchen FLUoxetine (PROZAC) 20 MG capsule Take 40 mg by mouth daily.    . furosemide (LASIX) 40 MG tablet Take 1 tablet (40 mg total) by mouth 2 (two) times daily. 60 tablet 6  . glimepiride (AMARYL) 4 MG tablet Take 4 mg by mouth 2 (two) times daily. Dr Alvester Chou    . ibuprofen (ADVIL,MOTRIN) 200 MG tablet Take 600 mg by mouth 2 (two) times daily as needed for moderate pain.     Marland Kitchen insulin aspart (NOVOLOG) 100 UNIT/ML injection Inject 20 Units into the skin 2 (two) times daily. Alvester Chou    . insulin regular human CONCENTRATED (HUMULIN R) 500 UNIT/ML SOLN injection Inject 50 Units into the skin 2 (two) times daily with a meal. Patient states takes 50 units U-500 bid    . lamoTRIgine (LAMICTAL) 200 MG tablet Take 200 mg by mouth at bedtime.    Marland Kitchen lisinopril (PRINIVIL,ZESTRIL) 10 MG tablet Take 1 tablet (10 mg total) by mouth at bedtime. 90  tablet 3  . loratadine (CLARITIN) 10 MG tablet Take 1 tablet (10 mg total) by mouth daily. 30 tablet 11  . OXYGEN Inhale 2 L into the lungs at bedtime.    . potassium chloride SA (K-DUR,KLOR-CON) 20 MEQ tablet Take 1 tablet (20 mEq total) by mouth daily. 30 tablet 6  . pregabalin (LYRICA) 150 MG capsule Take 1 capsule (150 mg total) by mouth 2 (two) times daily. 60 capsule 5  . PROVENTIL HFA 108 (90 Base) MCG/ACT inhaler INHALE TWO (2) PUFFS EVERY FOUR HOURS ASNEEDED FOR SHORTNESS OF BREATH 6.7 g 2  . QUEtiapine (SEROQUEL XR) 400 MG 24 hr tablet Take 800 mg by mouth at bedtime.    Marland Kitchen  Respiratory Therapy Supplies (PARI SOFT PLASTIC ADULT MASK) MISC Use with oxygen, To be titrated by respiratory therapist 1 each 0  . SPIRIVA RESPIMAT 2.5 MCG/ACT AERS INHALE 2 PUFFS INTO THE LUNGS DAILY. 4 g 12   No current facility-administered medications for this visit.      Allergies:   Metoprolol; Montelukast sodium; Morphine sulfate; Penicillins; and Gabapentin   Social History   Social History  . Marital status: Married    Spouse name: N/A  . Number of children: N/A  . Years of education: N/A   Social History Main Topics  . Smoking status: Current Every Day Smoker    Packs/day: 1.00    Years: 28.00    Types: Cigarettes  . Smokeless tobacco: Never Used  . Alcohol use No  . Drug use: No  . Sexual activity: Yes   Other Topics Concern  . None   Social History Narrative   Lives in [place]   Married but lives with Boyfriend - not legally separated   Disabled - for BiPolar, Seizure disorder, diabetes   Formerly worked at [employer] History:  The patient's family history includes Asthma in her father and sister; Coronary artery disease in her mother; Diabetes in her father and mother; Diabetes type II in her brother; Hypertension in her mother; Other in her brother; Venous thrombosis in her brother.   ROS:   Please see the history of present illness. All other systems are  reviewed and otherwise negative.    PHYSICAL EXAM:   VS:  BP (!) 144/70 (BP Location: Left Arm, Patient Position: Sitting, Cuff Size: Large)   Pulse (!) 102   Ht 5\' 4"  (1.626 m)   Wt 278 lb (126.1 kg)   LMP 11/11/2011   BMI 47.72 kg/m   BMI: Body mass index is 47.72 kg/m. GEN: Well nourished, well developed obese WF, in no acute distress  HEENT: normocephalic, atraumatic Neck: no JVD, carotid bruits, or masses Cardiac:RRR mildly elevated rate; no murmurs, rubs, or gallops, trace BLE ankle edema, nonpitting Respiratory: diminished BS throughout, no wheezes, rales or rhonchi, normal work of breathing GI: soft, nontender, nondistended, + BS MS: no deformity or atrophy  Skin: warm and dry, no rash Neuro:  Alert and Oriented x 3, Strength and sensation are intact, follows commands Psych: euthymic mood, full affect  Wt Readings from Last 3 Encounters:  05/24/16 278 lb (126.1 kg)  05/18/16 271 lb 6.4 oz (123.1 kg)  05/16/16 282 lb 6.4 oz (128.1 kg)      Studies/Labs Reviewed:   EKG:  EKG was ordered today and personally reviewed by me and demonstrates sinus tach 102bpm RBBB, no acute changes.  Recent Labs: 02/25/2016: TSH 0.56 05/16/2016: ALT 73; Hemoglobin 12.8; Platelets 231 05/17/2016: B Natriuretic Peptide 15.3 05/18/2016: BUN 19; Creatinine, Ser 1.12; Potassium 4.8; Sodium 132   Lipid Panel    Component Value Date/Time   CHOL 166 02/04/2015 1056   TRIG 352 (H) 02/04/2015 1056   HDL 37 (L) 02/04/2015 1056   CHOLHDL 4.5 02/04/2015 1056   VLDL 70 (H) 02/04/2015 1056   LDLCALC 59 02/04/2015 1056   LDLDIRECT 145 (H) 04/30/2009 2013    Additional studies/ records that were reviewed today include: Summarized above.    ASSESSMENT & PLAN:   1. Chronic diastolic CHF - challenging to know how much of her dyspnea is HF vs COPD. Weight is up in clinic today but she is wearing bulky  clothes and shoes. She and her husband report her volume status is actually better. She does  admit to drinking excessive fluid, much more than 2L per day. Advised she restrict to 64oz per day and no more than 2000mg  of sodium per day. It sounds like she is continuing to diurese on the Lasix at home - will continue higher dose for now. Recheck labs today.. She has not gotten a scale but her husband states she plans to buy one today. We reviewed importance of daily weights. Will increase diltiazem to 300mg  daily to improve filling time. Will recheck labs today. Note recent BNP was totally normal although can be falsely low in obese patients. She does report the majority of her recent symptom improvement came from rx of AECOPD. See below. 2. COPD - I believe this remains the primary and significant issue. She continues to smoke which she is aware will only lead to worsening dyspnea. She noticed her breathing got worse again after she stopped the prednisone. She may have reached a point where she would require chronic steroids for her COPD. I have asked her to f/u with PCP ASAP to review. She also has terrible DM so would require close f/u in their office to monitor blood sugar while on steroids. She will call today for an appt. Will refer to pulmonology. 3. Essential HTN - follow BP with increase in diltiazem. 4. Sinus tach - see above. 5. CAD - continue ASA. Not on BB due to severe COPD. Not on statin - unclear why - but she did have recent abnormal liver function testing so will hold off initiation for now.  Disposition: F/u with me, Lyda Jester or Dr. Irish Lack tentatively in 6 weeks - however, if labs indicate interval worsening of HF prompting increase in Lasix, would probably recommend closer follow-up instead.   Medication Adjustments/Labs and Tests Ordered: Current medicines are reviewed at length with the patient today.  Concerns regarding medicines are outlined above. Medication changes, Labs and Tests ordered today are summarized above and listed in the Patient Instructions accessible  in Encounters.   Raechel Ache PA-C  05/24/2016 8:47 AM    [address], [place], [place]  [address] Phone: [phone]; Fax: [phone]

## 2016-05-24 ENCOUNTER — Encounter: Payer: Self-pay | Admitting: Physician Assistant

## 2016-05-24 ENCOUNTER — Ambulatory Visit (INDEPENDENT_AMBULATORY_CARE_PROVIDER_SITE_OTHER): Payer: BLUE CROSS/BLUE SHIELD | Admitting: Physician Assistant

## 2016-05-24 VITALS — BP 144/70 | HR 102 | Ht 64.0 in | Wt 278.0 lb

## 2016-05-24 DIAGNOSIS — R000 Tachycardia, unspecified: Secondary | ICD-10-CM

## 2016-05-24 DIAGNOSIS — I10 Essential (primary) hypertension: Secondary | ICD-10-CM

## 2016-05-24 DIAGNOSIS — R0602 Shortness of breath: Secondary | ICD-10-CM | POA: Diagnosis not present

## 2016-05-24 DIAGNOSIS — J449 Chronic obstructive pulmonary disease, unspecified: Secondary | ICD-10-CM | POA: Diagnosis not present

## 2016-05-24 DIAGNOSIS — I5032 Chronic diastolic (congestive) heart failure: Secondary | ICD-10-CM

## 2016-05-24 DIAGNOSIS — I2510 Atherosclerotic heart disease of native coronary artery without angina pectoris: Secondary | ICD-10-CM | POA: Diagnosis not present

## 2016-05-24 LAB — CBC WITH DIFFERENTIAL/PLATELET
Basophils Absolute: 141 cells/uL (ref 0–200)
Basophils Relative: 1 %
Eosinophils Absolute: 423 cells/uL (ref 15–500)
Eosinophils Relative: 3 %
HCT: 39.9 % (ref 35.0–45.0)
Hemoglobin: 13.3 g/dL (ref 11.7–15.5)
Lymphocytes Relative: 25 %
Lymphs Abs: 3525 cells/uL (ref 850–3900)
MCH: 29.8 pg (ref 27.0–33.0)
MCHC: 33.3 g/dL (ref 32.0–36.0)
MCV: 89.3 fL (ref 80.0–100.0)
MPV: 11 fL (ref 7.5–12.5)
Monocytes Absolute: 705 cells/uL (ref 200–950)
Monocytes Relative: 5 %
Neutro Abs: 9306 cells/uL — ABNORMAL HIGH (ref 1500–7800)
Neutrophils Relative %: 66 %
Platelets: 252 10*3/uL (ref 140–400)
RBC: 4.47 MIL/uL (ref 3.80–5.10)
RDW: 16.2 % — ABNORMAL HIGH (ref 11.0–15.0)
WBC: 14.1 10*3/uL — ABNORMAL HIGH (ref 3.8–10.8)

## 2016-05-24 LAB — COMPREHENSIVE METABOLIC PANEL
ALT: 45 U/L — ABNORMAL HIGH (ref 6–29)
AST: 26 U/L (ref 10–35)
Albumin: 3.6 g/dL (ref 3.6–5.1)
Alkaline Phosphatase: 100 U/L (ref 33–115)
BUN: 30 mg/dL — ABNORMAL HIGH (ref 7–25)
CO2: 26 mmol/L (ref 20–31)
Calcium: 9.2 mg/dL (ref 8.6–10.2)
Chloride: 95 mmol/L — ABNORMAL LOW (ref 98–110)
Creat: 1.2 mg/dL — ABNORMAL HIGH (ref 0.50–1.10)
Glucose, Bld: 395 mg/dL — ABNORMAL HIGH (ref 65–99)
Potassium: 4.8 mmol/L (ref 3.5–5.3)
Sodium: 131 mmol/L — ABNORMAL LOW (ref 135–146)
Total Bilirubin: 0.4 mg/dL (ref 0.2–1.2)
Total Protein: 6.9 g/dL (ref 6.1–8.1)

## 2016-05-24 LAB — BRAIN NATRIURETIC PEPTIDE: Brain Natriuretic Peptide: <4 pg/mL (ref <100)

## 2016-05-24 MED ORDER — DILTIAZEM HCL ER COATED BEADS 300 MG PO CP24: 300.0000 mg | ORAL_CAPSULE | Freq: Every day | ORAL | 3 refills | Status: DC

## 2016-05-24 NOTE — Patient Instructions (Addendum)
Medication Instructions:  Your physician has recommended you make the following change in your medication:  1-INCREASE Diltiazem 300 mg by mouth daily  Lab work: Your physician recommends that you have lab work today:  CMET CBC and BNP  Testing/Procedures: NONE  Follow-Up: Your physician wants you to follow-up in: 6 weeks with Dr. Irish Lack, Melina Copa PA, and [place].   You have been referred to pulmonology   Your physician recommends that you schedule a follow-up appointment as soon as possible with your primary care doctor to discuss treatment of COPD  Your physician recommends you to stick to < 64 oz fluid per day and no more than 2000 mg sodium per day diet.       If you need a refill on your cardiac medications before your next appointment, please call your pharmacy.    Low-Sodium Eating Plan Sodium raises blood pressure and causes water to be held in the body. Getting less sodium from food will help lower your blood pressure, reduce any swelling, and protect your heart, liver, and kidneys. We get sodium by adding salt (sodium chloride) to food. Most of our sodium comes from canned, boxed, and frozen foods. Restaurant foods, fast foods, and pizza are also very high in sodium. Even if you take medicine to lower your blood pressure or to reduce fluid in your body, getting less sodium from your food is important. What is my plan? Most people should limit their sodium intake to 2,000 mg a day. Your health care provider recommends that you limit your sodium intake to _2, 000 mg_ a day. What do I need to know about this eating plan? For the low-sodium eating plan, you will follow these general guidelines:  Choose foods with a % Daily Value for sodium of less than 5% (as listed on the food label).  Use salt-free seasonings or herbs instead of table salt or sea salt.  Check with your health care provider or pharmacist before using salt substitutes.  Eat fresh  foods.  Eat more vegetables and fruits.  Limit canned vegetables. If you do use them, rinse them well to decrease the sodium.  Limit cheese to 1 oz (28 g) per day.  Eat lower-sodium products, often labeled as "lower sodium" or "no salt added."  Avoid foods that contain monosodium glutamate (MSG). MSG is sometimes added to [place] food and some canned foods.  Check food labels (Nutrition Facts labels) on foods to learn how much sodium is in one serving.  Eat more home-cooked food and less restaurant, buffet, and fast food.  When eating at a restaurant, ask that your food be prepared with less salt, or no salt if possible. How do I read food labels for sodium information? The Nutrition Facts label lists the amount of sodium in one serving of the food. If you eat more than one serving, you must multiply the listed amount of sodium by the number of servings. Food labels may also identify foods as:  Sodium free-Less than 5 mg in a serving.  Very low sodium-35 mg or less in a serving.  Low sodium-140 mg or less in a serving.  Light in sodium-50% less sodium in a serving. For example, if a food that usually has 300 mg of sodium is changed to become light in sodium, it will have 150 mg of sodium.  Reduced sodium-25% less sodium in a serving. For example, if a food that usually has 400 mg of sodium is changed to reduced sodium,  it will have 300 mg of sodium. What foods can I eat? Grains  Low-sodium cereals, including oats, puffed wheat and rice, and shredded wheat cereals. Low-sodium crackers. Unsalted rice and pasta. Lower-sodium bread. Vegetables  Frozen or fresh vegetables. Low-sodium or reduced-sodium canned vegetables. Low-sodium or reduced-sodium tomato sauce and paste. Low-sodium or reduced-sodium tomato and vegetable juices. Fruits  Fresh, frozen, and canned fruit. Fruit juice. Meat and Other Protein Products  Low-sodium canned tuna and salmon. Fresh or frozen meat, poultry,  seafood, and fish. Lamb. Unsalted nuts. Dried beans, peas, and lentils without added salt. Unsalted canned beans. Homemade soups without salt. Eggs. Dairy  Milk. Soy milk. Ricotta cheese. Low-sodium or reduced-sodium cheeses. Yogurt. Condiments  Fresh and dried herbs and spices. Salt-free seasonings. Onion and garlic powders. Low-sodium varieties of mustard and ketchup. Fresh or refrigerated horseradish. Lemon juice. Fats and Oils  Reduced-sodium salad dressings. Unsalted butter. Other  Unsalted popcorn and pretzels. The items listed above may not be a complete list of recommended foods or beverages. Contact your dietitian for more options.  What foods are not recommended? Grains  Instant hot cereals. Bread stuffing, pancake, and biscuit mixes. Croutons. Seasoned rice or pasta mixes. Noodle soup cups. Boxed or frozen macaroni and cheese. Self-rising flour. Regular salted crackers. Vegetables  Regular canned vegetables. Regular canned tomato sauce and paste. Regular tomato and vegetable juices. Frozen vegetables in sauces. Salted [place] fries. Olives. Angie Fava. Relishes. Sauerkraut. Salsa. Meat and Other Protein Products  Salted, canned, smoked, spiced, or pickled meats, seafood, or fish. Bacon, ham, sausage, hot dogs, corned beef, chipped beef, and packaged luncheon meats. Salt pork. Jerky. Pickled herring. Anchovies, regular canned tuna, and sardines. Salted nuts. Dairy  Processed cheese and cheese spreads. Cheese curds. Blue cheese and cottage cheese. Buttermilk. Condiments  Onion and garlic salt, seasoned salt, table salt, and sea salt. Canned and packaged gravies. Worcestershire sauce. Tartar sauce. Barbecue sauce. Teriyaki sauce. Soy sauce, including reduced sodium. Steak sauce. Fish sauce. Oyster sauce. Cocktail sauce. Horseradish that you find on the shelf. Regular ketchup and mustard. Meat flavorings and tenderizers. Bouillon cubes. Hot sauce. Tabasco sauce. Marinades. Taco seasonings.  Relishes. Fats and Oils  Regular salad dressings. Salted butter. Margarine. Ghee. Bacon fat. Other  Potato and tortilla chips. Corn chips and puffs. Salted popcorn and pretzels. Canned or dried soups. Pizza. Frozen entrees and pot pies. The items listed above may not be a complete list of foods and beverages to avoid. Contact your dietitian for more information.  This information is not intended to replace advice given to you by your health care provider. Make sure you discuss any questions you have with your health care provider. Document Released: 12/17/2001 Document Revised: 12/03/2015 Document Reviewed: 05/01/2013 Elsevier Interactive Patient Education  2017 [employer].

## 2016-05-26 ENCOUNTER — Telehealth: Payer: Self-pay | Admitting: *Deleted

## 2016-05-26 DIAGNOSIS — J449 Chronic obstructive pulmonary disease, unspecified: Secondary | ICD-10-CM | POA: Diagnosis not present

## 2016-05-26 DIAGNOSIS — I10 Essential (primary) hypertension: Secondary | ICD-10-CM | POA: Diagnosis not present

## 2016-05-26 DIAGNOSIS — R601 Generalized edema: Secondary | ICD-10-CM | POA: Diagnosis not present

## 2016-05-26 DIAGNOSIS — E1140 Type 2 diabetes mellitus with diabetic neuropathy, unspecified: Secondary | ICD-10-CM | POA: Diagnosis not present

## 2016-05-26 MED ORDER — FUROSEMIDE 40 MG PO TABS: 40.0000 mg | ORAL_TABLET | Freq: Every day | ORAL | 6 refills | Status: DC

## 2016-05-26 NOTE — Telephone Encounter (Signed)
-----   Message from Charlie Pitter, [place] sent at 05/26/2016  8:38 AM EST ----- I reviewed her labwork and situation with Dr. Tamala Julian. Her BUN and Cr have bumped slightly, suggesting that she is on the drier side. Would decrease Lasix to 40mg  daily, stop potassium supplement, and have her f/u in clinic early next week - would be best for her to see someone who has seen her before such as myself, Brittainy Simmons or [facility]. She will need BMET at that visit but I do not want to order just in case additional labs are requested at that time to avoid her being doubly stuck.    Thanks.

## 2016-05-26 NOTE — Telephone Encounter (Signed)
Pt has been made aware of her lab results and to decrease Lasix to 40 mg qd and d/c potassium. Pt scheduled to see Ellen Henri 05-31-16, per DD.  Pt agreeable with this plan and verbalized understanding.

## 2016-05-27 NOTE — Addendum Note (Signed)
Addended by: Stephannie Peters on: 05/27/2016 10:52 AM   Modules accepted: Orders

## 2016-05-30 DIAGNOSIS — E1140 Type 2 diabetes mellitus with diabetic neuropathy, unspecified: Secondary | ICD-10-CM | POA: Diagnosis not present

## 2016-05-30 DIAGNOSIS — R601 Generalized edema: Secondary | ICD-10-CM | POA: Diagnosis not present

## 2016-05-30 DIAGNOSIS — J449 Chronic obstructive pulmonary disease, unspecified: Secondary | ICD-10-CM | POA: Diagnosis not present

## 2016-05-30 DIAGNOSIS — I10 Essential (primary) hypertension: Secondary | ICD-10-CM | POA: Diagnosis not present

## 2016-05-31 ENCOUNTER — Ambulatory Visit: Payer: BLUE CROSS/BLUE SHIELD | Admitting: Cardiology

## 2016-06-06 DIAGNOSIS — I5033 Acute on chronic diastolic (congestive) heart failure: Secondary | ICD-10-CM | POA: Diagnosis not present

## 2016-06-06 DIAGNOSIS — I10 Essential (primary) hypertension: Secondary | ICD-10-CM | POA: Diagnosis not present

## 2016-06-06 DIAGNOSIS — J449 Chronic obstructive pulmonary disease, unspecified: Secondary | ICD-10-CM | POA: Diagnosis not present

## 2016-06-06 DIAGNOSIS — E1140 Type 2 diabetes mellitus with diabetic neuropathy, unspecified: Secondary | ICD-10-CM | POA: Diagnosis not present

## 2016-06-06 DIAGNOSIS — R601 Generalized edema: Secondary | ICD-10-CM | POA: Diagnosis not present

## 2016-06-08 DIAGNOSIS — R0989 Other specified symptoms and signs involving the circulatory and respiratory systems: Secondary | ICD-10-CM | POA: Diagnosis not present

## 2016-06-08 DIAGNOSIS — I5032 Chronic diastolic (congestive) heart failure: Secondary | ICD-10-CM | POA: Diagnosis not present

## 2016-06-08 DIAGNOSIS — E119 Type 2 diabetes mellitus without complications: Secondary | ICD-10-CM | POA: Diagnosis not present

## 2016-06-08 DIAGNOSIS — I10 Essential (primary) hypertension: Secondary | ICD-10-CM | POA: Diagnosis not present

## 2016-06-09 DIAGNOSIS — R0989 Other specified symptoms and signs involving the circulatory and respiratory systems: Secondary | ICD-10-CM | POA: Diagnosis not present

## 2016-06-14 ENCOUNTER — Ambulatory Visit (INDEPENDENT_AMBULATORY_CARE_PROVIDER_SITE_OTHER): Payer: BLUE CROSS/BLUE SHIELD | Admitting: Internal Medicine

## 2016-06-14 ENCOUNTER — Encounter: Payer: Self-pay | Admitting: Internal Medicine

## 2016-06-14 VITALS — BP 128/72 | HR 99 | Ht 64.0 in | Wt 278.2 lb

## 2016-06-14 DIAGNOSIS — I2510 Atherosclerotic heart disease of native coronary artery without angina pectoris: Secondary | ICD-10-CM

## 2016-06-14 DIAGNOSIS — J449 Chronic obstructive pulmonary disease, unspecified: Secondary | ICD-10-CM | POA: Diagnosis not present

## 2016-06-14 DIAGNOSIS — I10 Essential (primary) hypertension: Secondary | ICD-10-CM

## 2016-06-14 DIAGNOSIS — F17210 Nicotine dependence, cigarettes, uncomplicated: Secondary | ICD-10-CM

## 2016-06-14 MED ORDER — VALSARTAN 160 MG PO TABS: 160.0000 mg | ORAL_TABLET | Freq: Every day | ORAL | 11 refills | Status: DC

## 2016-06-14 MED ORDER — BUDESONIDE-FORMOTEROL FUMARATE 80-4.5 MCG/ACT IN AERO: INHALATION_SPRAY | RESPIRATORY_TRACT | 11 refills | Status: DC

## 2016-06-14 NOTE — Progress Notes (Signed)
Subjective:     Patient ID: [name], female   DOB: 11-18-1970,   MRN: [id number]  HPI  17 yowf active smoker "always needed an inhaler" / freq missed school but finished HS and gradually worse since then referred to pulmonary clinic 06/14/2016 by Dr   Aris Lot with no evidence of airflow obst on initial ov    06/14/2016 1st [facility] office visit/ Lyndsey Demos  spriva  Chief Complaint  Patient presents with  . Pulm Consult    for asthma, COPD, and SOB.   cough is dry daily  24/7 x years on ACEi for at least 3 of those years but not certain if started acei before or after onset Doe = MMRC3 = can't walk 100 yards even at a slow pace at a flat grade s stopping due to sob   House to mb ok, mb to house stops half way/ coughing and breathing both limit exertion Sleeps on 02 x 3 y on 2lpm  spiriva each am respimat    Minimal day to day or daytime variability or assoc excess/ purulent sputum or mucus plugs or hemoptysis or cp or chest tightness, subjective wheeze or overt sinus or hb symptoms. No unusual exp hx or h/o childhood pna/ asthma or knowledge of premature birth.  Sleeping ok without nocturnal  or early am exacerbation  of respiratory  c/o's or need for noct saba. Also denies any obvious fluctuation of symptoms with weather or environmental changes or other aggravating or alleviating factors except as outlined above   Current Medications, Allergies, Complete Past Medical History, Past Surgical History, Family History, and Social History were reviewed in [employer] [facility] record.  ROS  The following are not active complaints unless bolded sore throat, dysphagia, dental problems, itching, sneezing,  nasal congestion or excess/ purulent secretions, ear ache,   fever, chills, sweats, unintended wt loss, classically pleuritic or exertional cp,  orthopnea pnd or leg swelling, presyncope, palpitations, abdominal pain, anorexia, nausea, vomiting, diarrhea  or change in bowel or  bladder habits, change in stools or urine, dysuria,hematuria,  rash, arthralgias, visual complaints, headache, numbness, weakness or ataxia or problems with walking or coordination,  change in mood/affect or memory.          Review of Systems     Objective:   Physical Exam    hoarse obese amb wf nad /shrill upper airway cough  Wt Readings from Last 3 Encounters:  06/14/16 278 lb 3.2 oz (126.2 kg)  05/24/16 278 lb (126.1 kg)  05/18/16 271 lb 6.4 oz (123.1 kg)    Vital signs reviewed  - Note on arrival 02 sats  93% on RA     HEENT: nl dentition, turbinates, and oropharynx. Nl external ear canals without cough reflex   NECK :  without JVD/Nodes/TM/ nl carotid upstrokes bilaterally   LUNGS: no acc muscle use,  Nl contour chest with end exp wheeze /cough    CV:  RRR  no s3 or murmur or increase in P2, nad no edema   ABD:  soft and nontender with nl inspiratory excursion in the supine position. No bruits or organomegaly appreciated, bowel sounds nl  MS:  Nl gait/ ext warm without deformities, calf tenderness, cyanosis or clubbing No obvious joint restrictions   SKIN: warm and dry without lesions    NEURO:  alert, approp, nl sensorium with  no motor or cerebellar deficits apparent.     I personally reviewed images and agree with radiology impression as follows:  CXR:   05/17/16 No infiltrate or pulmonary edema. Mild perihilar bronchitic changes. Tenting of the right hemidiaphragm again noted. Degenerative changes thoracic spine.      Assessment:

## 2016-06-14 NOTE — Patient Instructions (Addendum)
Stop spiriva and lisinopril   Start diovan (valsartan) 160 mg daily in place of lisinopril   Plan A = Automatic = symbicort 80 Take 2 puffs first thing in am and then another 2 puffs about 12 hours later.    Work on inhaler technique:  relax and gently blow all the way out then take a nice smooth deep breath back in, triggering the inhaler at same time you start breathing in.  Hold for up to 5 seconds if you can. Blow out thru nose. Rinse and gargle with water when done      Plan B = Backup Only use your albuterol (proair)  as a rescue medication to be used if you can't catch your breath by resting or doing a relaxed purse lip breathing pattern.  - The less you use it, the better it will work when you need it. - Ok to use the inhaler up to 2 puffs  every 4 hours if you must but call for appointment if use goes up over your usual need - Don't leave home without it !!  (think of it like the spare tire for your car)   Plan C = Crisis - only use your albuterol nebulizer if you first try Plan B and it fails to help > ok to use the nebulizer up to every 4 hours but if start needing it regularly call for immediate appointment   The key is to stop smoking completely before smoking completely stops you!   Please schedule a follow up office visit in 6 weeks, call sooner if needed

## 2016-06-15 DIAGNOSIS — F17210 Nicotine dependence, cigarettes, uncomplicated: Secondary | ICD-10-CM | POA: Insufficient documentation

## 2016-06-15 NOTE — Assessment & Plan Note (Signed)
Spirometry 06/14/2016  FEV1 2.17 (75%)  Ratio 82 with min curvature after am spiriva > d/c'd 06/14/2016  - 06/14/2016  After extensive coaching HFA effectiveness =    75% > try symbicort 80 2bid    Symptoms are markedly disproportionate to objective findings and not clear this is a lung problem but pt does appear to have difficult airway management issues. DDX of  difficult airways management almost all start with A and  include Adherence, Ace Inhibitors, Acid Reflux, Active Sinus Disease, Alpha 1 Antitripsin deficiency, Anxiety masquerading as Airways dz,  ABPA,  Allergy(esp in young), Aspiration (esp in elderly), Adverse effects of meds,  Active smokers, A bunch of PE's (a small clot burden can't cause this syndrome unless there is already severe underlying pulm or vascular dz with poor reserve) plus two Bs  = Bronchiectasis and Beta blocker use..and one C= CHF   Adherence is always the initial "prime suspect" and is a multilayered concern that requires a "trust but verify" approach in every patient - starting with knowing how to use medications, especially inhalers, correctly, keeping up with refills and understanding the fundamental difference between maintenance and prns vs those medications only taken for a very short course and then stopped and not refilled.  - - The proper method of use, as well as anticipated side effects, of a metered-dose inhaler are discussed and demonstrated to the patient. Improved effectiveness after extensive coaching during this visit to a level of approximately 75 % from a baseline of 50 % should do fine with hfa but rec sym 80 2bid since really has more of an AB pattern than true copd   ACEi adverse effects at the  top of the usual list of suspects and the only way to rule it out is a trial off > see a/p    Active smoking also near the top (see separate a/p)    Total time devoted to counseling  > 50 % of a 60 min office visit:  review case with pt/ discussion of  options/alternatives/ personally creating written customized instructions  in presence of pt  then going over those specific  Instructions directly with the pt including how to use all of the meds but in particular covering each new medication in detail and the difference between the maintenance/automatic meds and the prns using an action plan format for the latter.  Please see AVS from this visit for a full list of these instructions

## 2016-06-15 NOTE — Assessment & Plan Note (Signed)
Body mass index is 47.75   Lab Results  Component Value Date   TSH 0.56 02/25/2016     Contributing to gerd tendency/ doe/reviewed the need and the process to achieve and maintain neg calorie balance > defer f/u primary care including intermittently monitoring thyroid status

## 2016-06-15 NOTE — Assessment & Plan Note (Signed)

## 2016-06-15 NOTE — Assessment & Plan Note (Signed)
Changed ace to arb 06/14/2016 due to cough   In the best review of chronic cough to date ( NEJM 2016 375 [id number]) ,  ACEi are now felt to cause cough in up to  20% of pts which is a 4 fold increase from previous reports and does not include the variety of non-specific complaints we see in pulmonary clinic in pts on ACEi but previously attributed to another dx like  Copd/asthma and  include PNDS, throat and chest congestion, "bronchitis", unexplained dyspnea and noct "strangling" sensations, and hoarseness, but also  atypical /refractory GERD symptoms like dysphagia and "bad heartburn"   The only way I know  to prove this is not an "ACEi Case" is a trial off ACEi x a minimum of 6 weeks then regroup.   rec try valsartan 160 mg daily and return in 6 weeks to regroup

## 2016-06-16 ENCOUNTER — Institutional Professional Consult (permissible substitution): Payer: BLUE CROSS/BLUE SHIELD | Admitting: Internal Medicine

## 2016-06-18 DIAGNOSIS — J449 Chronic obstructive pulmonary disease, unspecified: Secondary | ICD-10-CM | POA: Diagnosis not present

## 2016-06-23 ENCOUNTER — Encounter: Payer: Self-pay | Admitting: Physician Assistant

## 2016-06-28 DIAGNOSIS — I10 Essential (primary) hypertension: Secondary | ICD-10-CM | POA: Diagnosis not present

## 2016-06-30 DIAGNOSIS — F3131 Bipolar disorder, current episode depressed, mild: Secondary | ICD-10-CM | POA: Diagnosis not present

## 2016-07-05 ENCOUNTER — Ambulatory Visit: Payer: BLUE CROSS/BLUE SHIELD | Admitting: Physician Assistant

## 2016-07-07 DIAGNOSIS — I10 Essential (primary) hypertension: Secondary | ICD-10-CM | POA: Diagnosis not present

## 2016-07-07 DIAGNOSIS — J449 Chronic obstructive pulmonary disease, unspecified: Secondary | ICD-10-CM | POA: Diagnosis not present

## 2016-07-07 DIAGNOSIS — E1140 Type 2 diabetes mellitus with diabetic neuropathy, unspecified: Secondary | ICD-10-CM | POA: Diagnosis not present

## 2016-07-07 DIAGNOSIS — R601 Generalized edema: Secondary | ICD-10-CM | POA: Diagnosis not present

## 2016-07-18 DIAGNOSIS — R0989 Other specified symptoms and signs involving the circulatory and respiratory systems: Secondary | ICD-10-CM | POA: Diagnosis not present

## 2016-07-18 DIAGNOSIS — I5032 Chronic diastolic (congestive) heart failure: Secondary | ICD-10-CM | POA: Diagnosis not present

## 2016-07-18 DIAGNOSIS — F17200 Nicotine dependence, unspecified, uncomplicated: Secondary | ICD-10-CM | POA: Diagnosis not present

## 2016-07-18 DIAGNOSIS — E119 Type 2 diabetes mellitus without complications: Secondary | ICD-10-CM | POA: Diagnosis not present

## 2016-07-26 ENCOUNTER — Ambulatory Visit: Payer: BLUE CROSS/BLUE SHIELD | Admitting: Internal Medicine

## 2016-08-01 DIAGNOSIS — D519 Vitamin B12 deficiency anemia, unspecified: Secondary | ICD-10-CM | POA: Diagnosis not present

## 2016-08-01 DIAGNOSIS — J029 Acute pharyngitis, unspecified: Secondary | ICD-10-CM | POA: Diagnosis not present

## 2016-08-01 DIAGNOSIS — J449 Chronic obstructive pulmonary disease, unspecified: Secondary | ICD-10-CM | POA: Diagnosis not present

## 2016-08-01 DIAGNOSIS — I509 Heart failure, unspecified: Secondary | ICD-10-CM | POA: Diagnosis not present

## 2016-08-01 DIAGNOSIS — E1140 Type 2 diabetes mellitus with diabetic neuropathy, unspecified: Secondary | ICD-10-CM | POA: Diagnosis not present

## 2016-08-01 DIAGNOSIS — I10 Essential (primary) hypertension: Secondary | ICD-10-CM | POA: Diagnosis not present

## 2016-08-02 IMAGING — DX DG CHEST 2V
2 series · 2 of 2 positions shown · non-contrast
Comparison: None.

CLINICAL DATA: Acute onset of left-sided chest pain, extending
posteriorly. Shortness of breath. Status post fall, with 2
left-sided rib fractures. Initial encounter.

EXAM:
CHEST  2 VIEW

[chest pa]
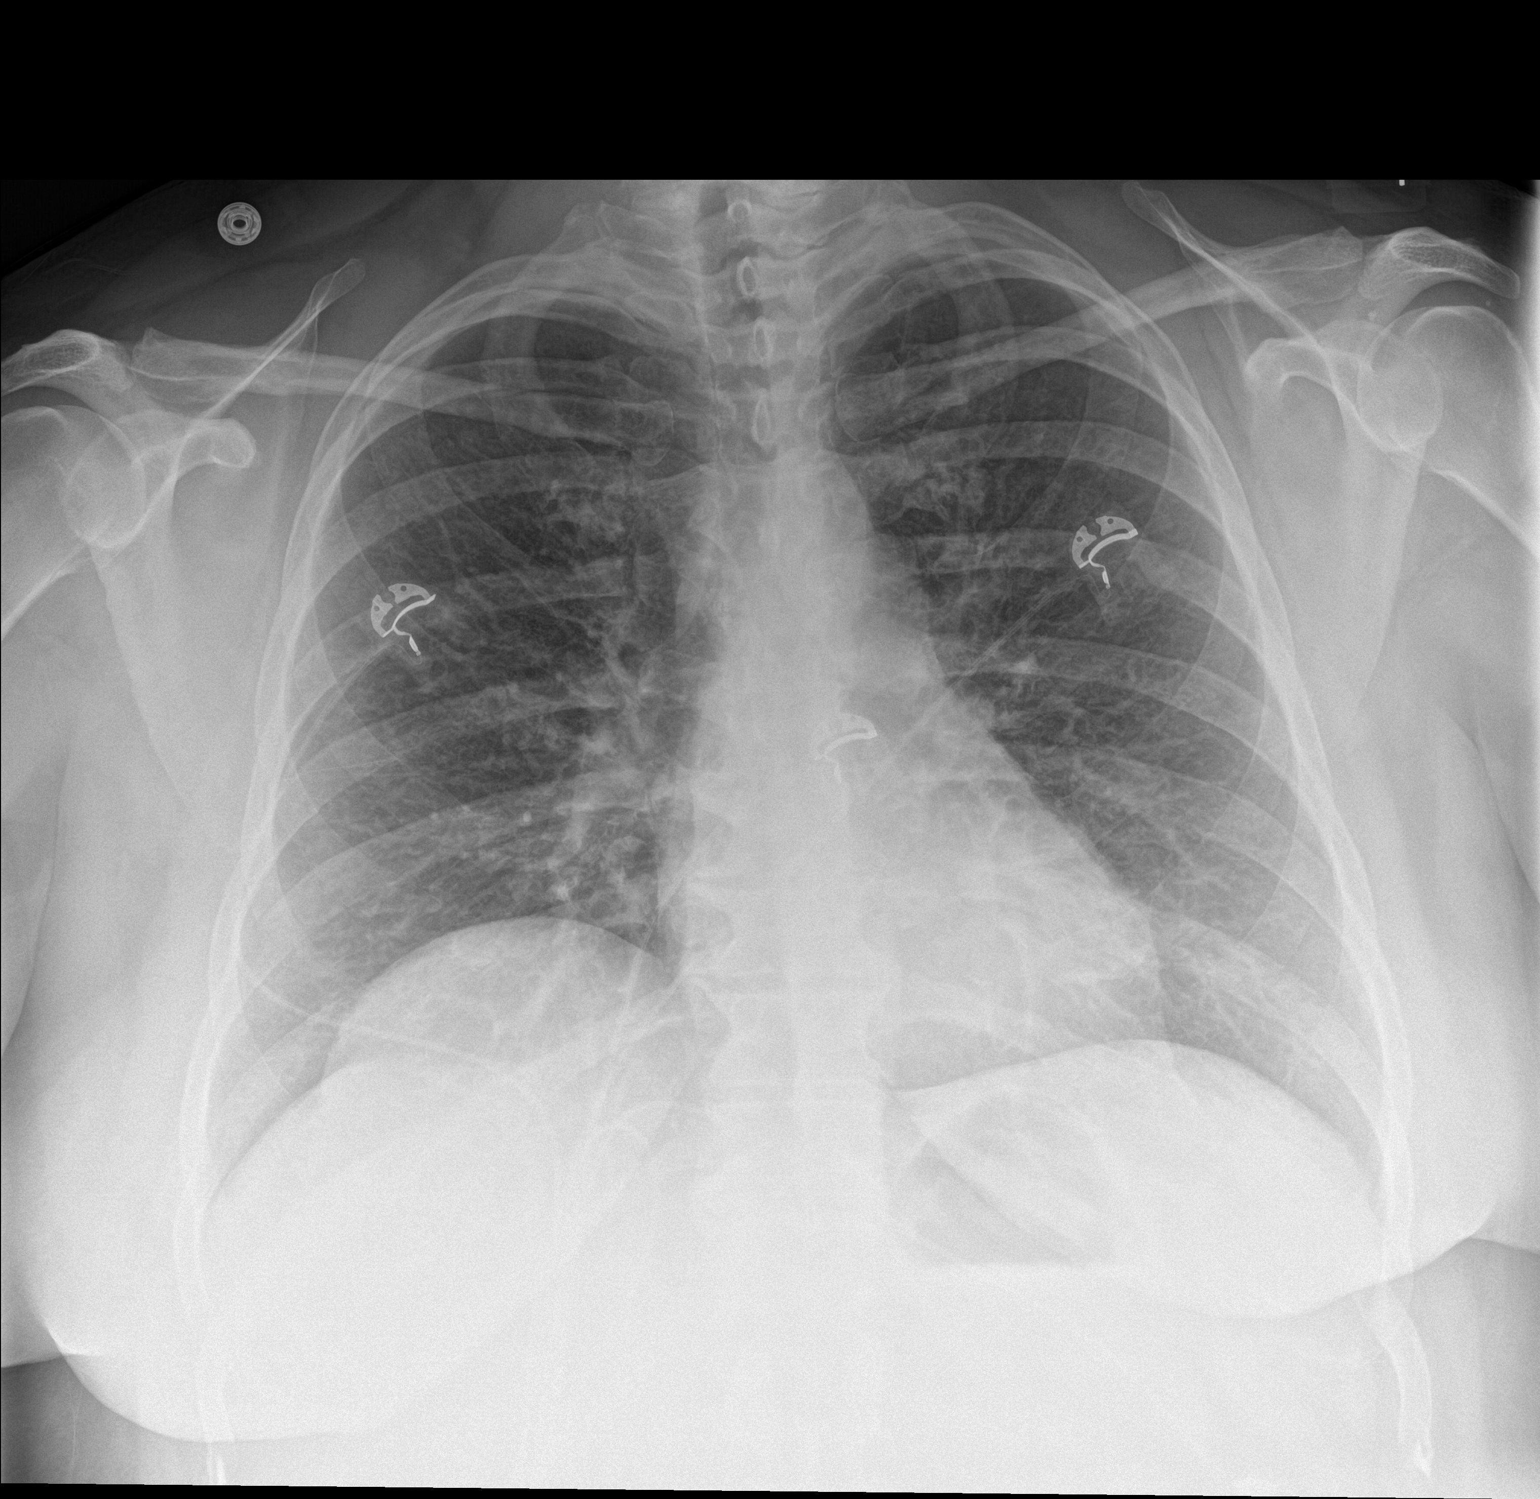

[chest lat]
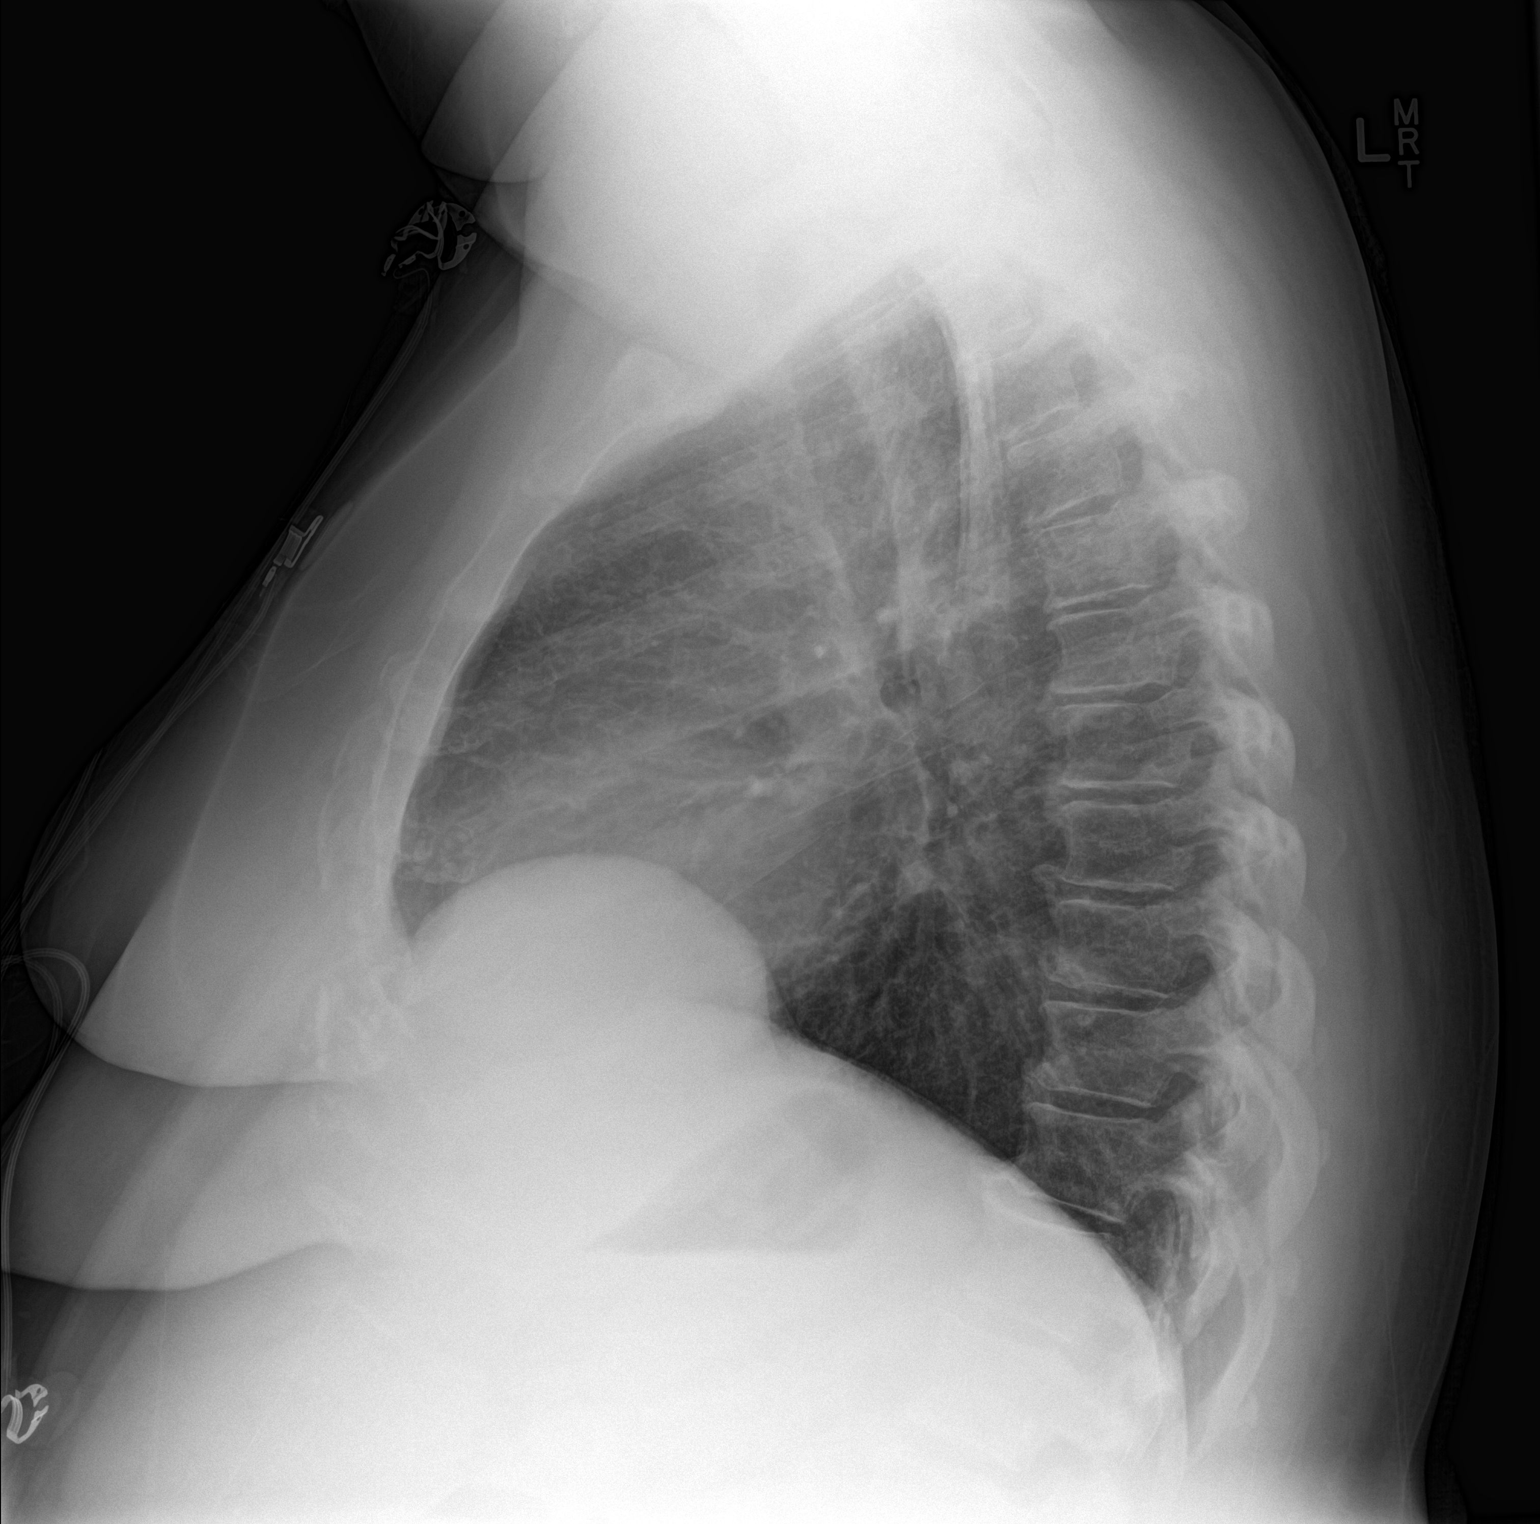

[2 of 2 positions shown; findings below may reference images not displayed]

FINDINGS: The lungs are well-aerated. Minimal bilateral atelectasis is noted.
There is no evidence of pleural effusion or pneumothorax.

The heart is normal in size; the mediastinal contour is within
normal limits. The clinically described left-sided rib fractures are
not well characterized on this study.
IMPRESSION: Minimal bilateral atelectasis noted.  Lungs otherwise clear.

## 2016-08-12 ENCOUNTER — Ambulatory Visit: Payer: BLUE CROSS/BLUE SHIELD | Admitting: Internal Medicine

## 2016-08-22 ENCOUNTER — Ambulatory Visit (INDEPENDENT_AMBULATORY_CARE_PROVIDER_SITE_OTHER): Payer: BLUE CROSS/BLUE SHIELD | Admitting: Internal Medicine

## 2016-08-22 ENCOUNTER — Encounter: Payer: Self-pay | Admitting: Internal Medicine

## 2016-08-22 VITALS — BP 142/74 | HR 106 | Ht 64.0 in | Wt 285.0 lb

## 2016-08-22 DIAGNOSIS — J449 Chronic obstructive pulmonary disease, unspecified: Secondary | ICD-10-CM

## 2016-08-22 DIAGNOSIS — F17210 Nicotine dependence, cigarettes, uncomplicated: Secondary | ICD-10-CM | POA: Diagnosis not present

## 2016-08-22 DIAGNOSIS — R058 Other specified cough: Secondary | ICD-10-CM | POA: Insufficient documentation

## 2016-08-22 DIAGNOSIS — R05 Cough: Secondary | ICD-10-CM | POA: Diagnosis not present

## 2016-08-22 NOTE — Progress Notes (Signed)
Subjective:     Patient ID: [name], female   DOB: 1971/04/09  MRN: [id number]   Brief patient profile:  51 yowf active smoker "always needed an inhaler" / freq missed school but finished HS and gradually worse since then referred to pulmonary clinic 06/14/2016 by Dr Aris Lot with no evidence of airflow obst on initial ov     History of Present Illness  06/14/2016 1st [facility] office visit/ [name]  spriva  Chief Complaint  Patient presents with  . Pulm Consult    for asthma, COPD, and SOB.   cough is dry daily  24/7 x years on ACEi for at least 3 of those years but not certain if started acei before or after onset Doe = MMRC3 = can't walk 100 yards even at a slow pace at a flat grade s stopping due to sob   House to mb ok, mb to house stops half way/ coughing and breathing both limit exertion Sleeps on 02 x 3 y on 2lpm  spiriva each am respimat   rec Stop spiriva and lisinopril > ? Did not do  Start diovan (valsartan) 160 mg daily in place of lisinopril > says could not tolerate Plan A = Automatic = symbicort 80 Take 2 puffs first thing in am and then another 2 puffs about 12 hours later.  Work on inhaler technique:  relax and gently blow all the way  Plan B = Backup Only use your albuterol (proair)    Plan C = Crisis - only use your albuterol nebulizer if you first try Plan B   The key is to stop smoking completely before smoking completely stops you!     08/22/2016  f/u ov/Donielle Radziewicz re:  Copd 0/ still smoking/ sym 80 2bid much less need for saba / ? back on ACEi (confused with meds)  Chief Complaint  Patient presents with  . Follow-up    Breathing is much improved. Her cough is unchanged. She is using albuterol inhaler 1-3 x per wk on average.  no change doe stops half way from mb to house, cough worse first thing in am and again hs  = min mucoid      Minimal day to day or daytime variability or assoc purulent sputum or mucus plugs or hemoptysis or cp or chest tightness,  subjective wheeze or overt sinus or hb symptoms. No unusual exp hx or h/o childhood pna/ asthma or knowledge of premature birth.  Sleeping ok without nocturnal  or early am exacerbation  of respiratory  c/o's or need for noct saba. Also denies any obvious fluctuation of symptoms with weather or environmental changes or other aggravating or alleviating factors except as outlined above   Current Medications, Allergies, Complete Past Medical History, Past Surgical History, Family History, and Social History were reviewed in [employer] record.  ROS  The following are not active complaints unless bolded sore throat, dysphagia, dental problems, itching, sneezing,  nasal congestion or excess/ purulent secretions, ear ache,   fever, chills, sweats, unintended wt loss, classically pleuritic or exertional cp,  orthopnea pnd or leg swelling, presyncope, palpitations, abdominal pain, anorexia, nausea, vomiting, diarrhea  or change in bowel or bladder habits, change in stools or urine, dysuria,hematuria,  rash, arthralgias, visual complaints, headache, numbness, weakness or ataxia or problems with walking or coordination,  change in mood/affect or memory.          Objective:   Physical Exam   obese amb wf nad - harsh  upper airway pattern cough   08/22/2016       285   06/14/16 278 lb 3.2 oz (126.2 kg)  05/24/16 278 lb (126.1 kg)  05/18/16 271 lb 6.4 oz (123.1 kg)    Vital signs reviewed  - Note on arrival 02 sats  95% on RA     HEENT: nl dentition, turbinates, and oropharynx. Nl external ear canals without cough reflex   NECK :  without JVD/Nodes/TM/ nl carotid upstrokes bilaterally   LUNGS: no acc muscle use,  Nl contour chest with mostly upper airway wheezes   CV:  RRR  no s3 or murmur or increase in P2, nad no edema   ABD:  soft and nontender with nl inspiratory excursion in the supine position. No bruits or organomegaly appreciated, bowel sounds nl  MS:  Nl gait/ ext  warm without deformities, calf tenderness, cyanosis or clubbing No obvious joint restrictions   SKIN: warm and dry without lesions    NEURO:  alert, approp, nl sensorium with  no motor or cerebellar deficits apparent.     I personally reviewed images and agree with radiology impression as follows:  CXR:   05/17/16 No infiltrate or pulmonary edema. Mild perihilar bronchitic changes. Tenting of the right hemidiaphragm again noted. Degenerative changes thoracic spine.      Assessment:

## 2016-08-22 NOTE — Patient Instructions (Addendum)
Plan A = Automatic = symbicort 80 Take 2 puffs first thing in am and then another 2 puffs about 12 hours later.   Plan B = Backup Only use your albuterol as a rescue medication to be used if you can't catch your breath by resting or doing a relaxed purse lip breathing pattern.  - The less you use it, the better it will work when you need it. - Ok to use the inhaler up to 2 puffs  every 4 hours if you must but call for appointment if use goes up over your usual need - Don't leave home without it !!  (think of it like the spare tire for your car)   You do not have significant copd and you never will if you stop all smoking now   If off the lisinopril for a full six weeks and still coughing , please return with all medications in hand

## 2016-08-22 NOTE — Assessment & Plan Note (Addendum)
Body mass index is 48.92  Trending up  Lab Results  Component Value Date   TSH 0.56 02/25/2016     Contributing to gerd risk/ doe/reviewed the need and the process to achieve and maintain neg calorie balance > defer f/u primary care including intermittently monitoring thyroid status

## 2016-08-23 NOTE — Assessment & Plan Note (Signed)
Spirometry 06/14/2016  FEV1 2.17 (75%)  Ratio 82 with min curvature after am spiriva > d/c'd 06/14/2016  - 06/14/2016    try symbicort 80 2bid   - Spirometry 08/22/2016  No obst p am symb - 08/22/2016  After extensive coaching HFA effectiveness =    75% > continue symb 80 2bid   Main rx here is treating the symptoms since really doesn't meet the criteria for copd at this point  I had an extended discussion with the patient reviewing all relevant studies completed to date and  lasting 15 to 20 minutes of a 25 minute visit    Each maintenance medication was reviewed in detail including most importantly the difference between maintenance and prns and under what circumstances the prns are to be triggered using an action plan format that is not reflected in the computer generated alphabetically organized AVS.    Please see AVS for specific instructions unique to this visit that I personally wrote and verbalized to the the pt in detail and then reviewed with pt  by my nurse highlighting any  changes in therapy recommended at today's visit to their plan of care.

## 2016-08-23 NOTE — Assessment & Plan Note (Signed)

## 2016-08-23 NOTE — Assessment & Plan Note (Signed)
Spirometry 08/22/2016  wnl during flare of "wheeze" while on acei > rec trial off ACEi

## 2016-09-16 ENCOUNTER — Telehealth: Payer: Self-pay | Admitting: Internal Medicine

## 2016-09-16 NOTE — Telephone Encounter (Signed)
atc pt, unnamed female stated she was on the other line and would have to call us back.   Will await call back.

## 2016-09-16 NOTE — Telephone Encounter (Signed)
Patient is returning phone call.  °

## 2016-09-16 NOTE — Telephone Encounter (Signed)
Spoke with pt. States that she is having increased SOB during the day. Reports that she would like to have oxygen tanks to leave her home with. Her current oxygen prescription is for night time use only. Advised her that she would need to be tested for daytime oxygen before an order could be placed. Pt has been scheduled for 09/20/16 at 10:15am. Advised her that if her breathing got worse between now and her appointment to contact us or seek emergency care. Nothing further was needed.

## 2016-09-20 ENCOUNTER — Ambulatory Visit: Payer: BLUE CROSS/BLUE SHIELD | Admitting: Internal Medicine

## 2016-10-27 DIAGNOSIS — R0989 Other specified symptoms and signs involving the circulatory and respiratory systems: Secondary | ICD-10-CM | POA: Diagnosis not present

## 2016-10-27 DIAGNOSIS — E119 Type 2 diabetes mellitus without complications: Secondary | ICD-10-CM | POA: Diagnosis not present

## 2016-10-27 DIAGNOSIS — I5032 Chronic diastolic (congestive) heart failure: Secondary | ICD-10-CM | POA: Diagnosis not present

## 2016-10-28 ENCOUNTER — Encounter: Payer: Self-pay | Admitting: Internal Medicine

## 2016-10-28 ENCOUNTER — Ambulatory Visit (INDEPENDENT_AMBULATORY_CARE_PROVIDER_SITE_OTHER): Payer: BLUE CROSS/BLUE SHIELD | Admitting: Internal Medicine

## 2016-10-28 VITALS — BP 122/68 | HR 109 | Ht 66.0 in | Wt 282.6 lb

## 2016-10-28 DIAGNOSIS — J449 Chronic obstructive pulmonary disease, unspecified: Secondary | ICD-10-CM

## 2016-10-28 DIAGNOSIS — J9611 Chronic respiratory failure with hypoxia: Secondary | ICD-10-CM | POA: Insufficient documentation

## 2016-10-28 NOTE — Patient Instructions (Signed)
Please see patient coordinator before you leave today  to schedule overnight pulse oximetry so we can certify you for your concentrator  Weight control is simply a matter of calorie balance which needs to be tilted in your favor by eating less and exercising more.  To get the most out of exercise, you need to be continuously aware that you are short of breath, but never out of breath, for 30 minutes daily. As you improve, it will actually be easier for you to do the same amount of exercise  in  30 minutes so always push to the level where you are short of breath.     Pulmonary follow up is as needed

## 2016-10-28 NOTE — Assessment & Plan Note (Addendum)
Spirometry 06/14/2016  FEV1 2.17 (75%)  Ratio 82 with min curvature after am spiriva > d/c'd 06/14/2016  - 06/14/2016    try symbicort 80 2bid   - Spirometry 08/22/2016  No obst p am symb - 08/22/2016   continue symb 80 2bid    - 10/28/2016  After extensive coaching HFA effectiveness =    75% short Ti   Doing great/ no longer smoking    I reviewed the Fletcher curve with the patient that basically indicates  if you quit smoking when your best day FEV1 is still well preserved (as is clearly  the case here)  it is highly unlikely you will progress to severe disease and informed the patient there was  no medication on the market that has proven to alter the curve/ its downward trajectory  or the likelihood of progression of their disease(unlike other chronic medical conditions such as atheroclerosis where we do think we can change the natural hx with risk reducing meds)    Therefore stopping smoking and maintaining abstinence is the most important aspect of care, not choice of inhalers or for that matter, doctors.     Pulmonary f/u can therefore be prn   I had an extended discussion with the patient reviewing all relevant studies completed to date and  lasting 15 to 20 minutes of a 25 minute visit    Each maintenance medication was reviewed in detail including most importantly the difference between maintenance and prns and under what circumstances the prns are to be triggered using an action plan format that is not reflected in the computer generated alphabetically organized AVS.    Please see AVS for specific instructions unique to this visit that I personally wrote and verbalized to the the pt in detail and then reviewed with pt  by my nurse highlighting any  changes in therapy recommended at today's visit to their plan of care.

## 2016-10-28 NOTE — Assessment & Plan Note (Addendum)
10/28/2016   Walked RA  2 laps @ 185 ft each stopped due to  Sob, sat still 91% at nl pace  - ONO RA 10/28/2016 ordered   ono RA needed to certify for concentrator at hs/ most likely this is all due to obesity and would improve with wt loss [place]

## 2016-10-28 NOTE — Progress Notes (Signed)
Subjective:     Patient ID: [name], female   DOB: 11-Feb-1971  MRN: [id number]   Brief patient profile:  42 yowf quit smoking "always needed an inhaler" / freq missed school but finished HS and gradually worse since then referred to pulmonary clinic 06/14/2016 by Dr Aris Lot with no evidence of airflow obst on initial ov     History of Present Illness  06/14/2016 1st [facility] office visit/ [facility]  spriva  Chief Complaint  Patient presents with  . Pulm Consult    for asthma, COPD, and SOB.   cough is dry daily  24/7 x years on ACEi for at least 3 of those years but not certain if started acei before or after onset Doe = MMRC3 = can't walk 100 yards even at a slow pace at a flat grade s stopping due to sob   House to mb ok, mb to house stops half way/ coughing and breathing both limit exertion Sleeps on 02 x 3 y on 2lpm  spiriva each am respimat   rec Stop spiriva and lisinopril > ? Did not do  Start diovan (valsartan) 160 mg daily in place of lisinopril > says could not tolerate Plan A = Automatic = symbicort 80 Take 2 puffs first thing in am and then another 2 puffs about 12 hours later.  Work on inhaler technique:  relax and gently blow all the way  Plan B = Backup Only use your albuterol (proair)    Plan C = Crisis - only use your albuterol nebulizer if you first try Plan B   The key is to stop smoking completely before smoking completely stops you!     08/22/2016  f/u ov/Wert re:  Copd 0/ still smoking/ sym 80 2bid much less need for saba / ? back on ACEi (confused with meds)  Chief Complaint  Patient presents with  . Follow-up    Breathing is much improved. Her cough is unchanged. She is using albuterol inhaler 1-3 x per wk on average.  no change doe stops half way from mb to house, cough worse first thing in am and again hs  = min mucoid rec Plan A = Automatic = symbicort 80 Take 2 puffs first thing in am and then another 2 puffs about 12 hours later.  Plan B =  Backup Only use your albuterol as a rescue medication You do not have significant copd and you never will if you stop all smoking now  If off the lisinopril for a full six weeks and still coughing , please return with all medications in hand    10/28/2016  f/u ov/Wert re:  COPD GOLD 0 / quit smoking x 3 m symb 80 / rare saba Has been on noct 02 x years but wants amb 02  Chief Complaint  Patient presents with  . Follow-up    Pt states needing o2 recert. She only uses o2 2lpm with sleep. No dayime o2.   doe = MMRC3 = can't walk 100 yards even at a slow pace at a flat grade s stopping due to sob  / says 02 levels drop then too and can't complete a HT s stopping  No obvious day to day or daytime variability or assoc excess/ purulent sputum or mucus plugs or hemoptysis or cp or chest tightness, subjective wheeze or overt sinus or hb symptoms. No unusual exp hx or h/o childhood pna/ asthma or knowledge of premature birth.  Sleeping ok without nocturnal  or early am exacerbation  of respiratory  c/o's or need for noct saba. Also denies any obvious fluctuation of symptoms with weather or environmental changes or other aggravating or alleviating factors except as outlined above   Current Medications, Allergies, Complete Past Medical History, Past Surgical History, Family History, and Social History were reviewed in [employer] record.  ROS  The following are not active complaints unless bolded sore throat, dysphagia, dental problems, itching, sneezing,  nasal congestion or excess/ purulent secretions, ear ache,   fever, chills, sweats, unintended wt loss, classically pleuritic or exertional cp,  orthopnea pnd or leg swelling, presyncope, palpitations, abdominal pain, anorexia, nausea, vomiting, diarrhea  or change in bowel or bladder habits, change in stools or urine, dysuria,hematuria,  rash, arthralgias, visual complaints, headache, numbness, weakness or ataxia or problems with  walking or coordination,  change in mood/affect or memory.                Objective:   Physical Exam   obese amb wf nad  Mild hoarse/ voice fatigue    10/28/2016       282  08/22/2016       285   06/14/16 278 lb 3.2 oz (126.2 kg)  05/24/16 278 lb (126.1 kg)  05/18/16 271 lb 6.4 oz (123.1 kg)    Vital signs reviewed  - Note on arrival 02 sats  92% on RA     HEENT: nl   turbinates, and oropharynx. Nl external ear canals without cough reflex - edentulous     NECK :  without JVD/Nodes/TM/ nl carotid upstrokes bilaterally   LUNGS: no acc muscle use,  Nl contour chest - completely clear bilaterally to A and P   CV:  RRR  no s3 or murmur or increase in P2, nad no edema   ABD:  Red/  soft and nontender with nl inspiratory excursion in the supine position. No bruits or organomegaly appreciated, bowel sounds nl  MS:  Nl gait/ ext warm without deformities, calf tenderness, cyanosis or clubbing No obvious joint restrictions   SKIN: warm and dry without lesions    NEURO:  alert, approp, nl sensorium with  no motor or cerebellar deficits apparent.            Assessment:

## 2016-10-28 NOTE — Assessment & Plan Note (Signed)
Body mass index is 45.61 kg/m.  -  trending down, encouraged  Lab Results  Component Value Date   TSH 0.56 02/25/2016     Contributing to gerd risk/ doe/reviewed the need and the process to achieve and maintain neg calorie balance > defer f/u primary care including intermittently monitoring thyroid status

## 2016-11-04 DIAGNOSIS — I10 Essential (primary) hypertension: Secondary | ICD-10-CM | POA: Diagnosis not present

## 2016-11-04 DIAGNOSIS — E559 Vitamin D deficiency, unspecified: Secondary | ICD-10-CM | POA: Diagnosis not present

## 2016-11-04 DIAGNOSIS — R000 Tachycardia, unspecified: Secondary | ICD-10-CM | POA: Diagnosis not present

## 2016-11-04 DIAGNOSIS — E119 Type 2 diabetes mellitus without complications: Secondary | ICD-10-CM | POA: Diagnosis not present

## 2016-11-04 DIAGNOSIS — I5041 Acute combined systolic (congestive) and diastolic (congestive) heart failure: Secondary | ICD-10-CM | POA: Diagnosis not present

## 2016-11-04 DIAGNOSIS — J449 Chronic obstructive pulmonary disease, unspecified: Secondary | ICD-10-CM | POA: Diagnosis not present

## 2016-11-04 DIAGNOSIS — R601 Generalized edema: Secondary | ICD-10-CM | POA: Diagnosis not present

## 2016-11-04 DIAGNOSIS — E1165 Type 2 diabetes mellitus with hyperglycemia: Secondary | ICD-10-CM | POA: Diagnosis not present

## 2016-11-04 DIAGNOSIS — Z1329 Encounter for screening for other suspected endocrine disorder: Secondary | ICD-10-CM | POA: Diagnosis not present

## 2016-11-09 ENCOUNTER — Encounter: Payer: Self-pay | Admitting: Internal Medicine

## 2016-11-16 ENCOUNTER — Telehealth: Payer: Self-pay | Admitting: Internal Medicine

## 2016-11-16 DIAGNOSIS — J9611 Chronic respiratory failure with hypoxia: Secondary | ICD-10-CM

## 2016-11-16 NOTE — Telephone Encounter (Signed)
Per MW- ONO on RA done on 11/10/16 by [employer] was abnormal Need to repeat ONO on 2lpm  Spoke with pt and notified of results per Dr. Melvyn Novas. Pt verbalized understanding and denied any questions. Order sent to [facility]

## 2016-11-23 ENCOUNTER — Encounter: Payer: Self-pay | Admitting: Internal Medicine

## 2016-11-23 DIAGNOSIS — Z794 Long term (current) use of insulin: Secondary | ICD-10-CM | POA: Diagnosis not present

## 2016-11-23 DIAGNOSIS — E160 Drug-induced hypoglycemia without coma: Secondary | ICD-10-CM | POA: Diagnosis not present

## 2016-11-23 DIAGNOSIS — E1165 Type 2 diabetes mellitus with hyperglycemia: Secondary | ICD-10-CM | POA: Diagnosis not present

## 2016-11-29 DIAGNOSIS — I5033 Acute on chronic diastolic (congestive) heart failure: Secondary | ICD-10-CM | POA: Diagnosis not present

## 2016-12-06 ENCOUNTER — Telehealth: Payer: Self-pay | Admitting: Internal Medicine

## 2016-12-06 DIAGNOSIS — J9611 Chronic respiratory failure with hypoxia: Secondary | ICD-10-CM

## 2016-12-06 NOTE — Telephone Encounter (Signed)
Per MW- ONO on 2lpm abnormal  She needs to start using o2 3lpm with sleep and have ONO repeated on 3lpm  Spoke with pt and notified of results per Dr. Melvyn Novas. Pt verbalized understanding and denied any questions. Order was sent to [facility]

## 2016-12-07 DIAGNOSIS — R0989 Other specified symptoms and signs involving the circulatory and respiratory systems: Secondary | ICD-10-CM | POA: Diagnosis not present

## 2016-12-07 DIAGNOSIS — I5033 Acute on chronic diastolic (congestive) heart failure: Secondary | ICD-10-CM | POA: Diagnosis not present

## 2016-12-26 DIAGNOSIS — R0989 Other specified symptoms and signs involving the circulatory and respiratory systems: Secondary | ICD-10-CM | POA: Diagnosis not present

## 2016-12-26 DIAGNOSIS — I5033 Acute on chronic diastolic (congestive) heart failure: Secondary | ICD-10-CM | POA: Diagnosis not present

## 2016-12-29 ENCOUNTER — Encounter: Payer: Self-pay | Admitting: Internal Medicine

## 2016-12-29 ENCOUNTER — Ambulatory Visit (INDEPENDENT_AMBULATORY_CARE_PROVIDER_SITE_OTHER): Payer: BLUE CROSS/BLUE SHIELD | Admitting: Internal Medicine

## 2016-12-29 VITALS — BP 110/68 | HR 110 | Ht 66.0 in | Wt 269.8 lb

## 2016-12-29 DIAGNOSIS — J449 Chronic obstructive pulmonary disease, unspecified: Secondary | ICD-10-CM | POA: Diagnosis not present

## 2016-12-29 DIAGNOSIS — F17210 Nicotine dependence, cigarettes, uncomplicated: Secondary | ICD-10-CM

## 2016-12-29 DIAGNOSIS — J9611 Chronic respiratory failure with hypoxia: Secondary | ICD-10-CM

## 2016-12-29 NOTE — Patient Instructions (Addendum)
Please see patient coordinator before you leave today  to schedule overnight 02 sats on 3lpm   Weight control is simply a matter of calorie balance which needs to be tilted in your favor by eating less and exercising more.  To get the most out of exercise, you need to be continuously aware that you are short of breath, but never out of breath, for 30 minutes daily. As you improve, it will actually be easier for you to do the same amount of exercise  in  30 minutes so always push to the level where you are short of breath.    See Tammy NP 3 months  with all your medications, even over the counter meds, separated in two separate bags, the ones you take no matter what vs the ones you stop once you feel better and take only as needed when you feel you need them.   Tammy  will generate for you a new user friendly medication calendar that will put [place] all on the same page re: your medication use.     Without this process, it simply isn't possible to assure that we are providing  your outpatient care  with  the attention to detail we feel you deserve.   If we cannot assure that you're getting that kind of care,  then we cannot manage your problem effectively from this clinic.  Once you have seen Tammy and we are sure that we're all on the same page with your medication use she will arrange follow up with me.

## 2016-12-29 NOTE — Progress Notes (Signed)
Subjective:     Patient ID: [name], female   DOB: 12-23-1970  MRN: [id number]   Brief patient profile:  62 yowf quit smoking "always needed an inhaler" / freq missed school but finished HS and gradually worse since then referred to pulmonary clinic 06/14/2016 by Dr Aris Lot with no evidence of airflow obst on initial ov     History of Present Illness  06/14/2016 1st [place] [facility] office visit/ [place]  spriva  Chief Complaint  Patient presents with  . Pulm Consult    for asthma, COPD, and SOB.   cough is dry daily  24/7 x years on ACEi for at least 3 of those years but not certain if started acei before or after onset Doe = MMRC3 = can't walk 100 yards even at a slow pace at a flat grade s stopping due to sob   House to mb ok, mb to house stops half way/ coughing and breathing both limit exertion Sleeps on 02 x 3 y on 2lpm  spiriva each am respimat   rec Stop spiriva and lisinopril > ? Did not do  Start diovan (valsartan) 160 mg daily in place of lisinopril > says could not tolerate Plan A = Automatic = symbicort 80 Take 2 puffs first thing in am and then another 2 puffs about 12 hours later.  Work on inhaler technique:  relax and gently blow all the way  Plan B = Backup Only use your albuterol (proair)    Plan C = Crisis - only use your albuterol nebulizer if you first try Plan B   The key is to stop smoking completely before smoking completely stops you!     08/22/2016  f/u ov/[name] re:  Copd 0/ still smoking/ sym 80 2bid much less need for saba / ? back on ACEi (confused with meds)  Chief Complaint  Patient presents with  . Follow-up    Breathing is much improved. Her cough is unchanged. She is using albuterol inhaler 1-3 x per wk on average.  no change doe stops half way from mb to house, cough worse first thing in am and again hs  = min mucoid rec Plan A = Automatic = symbicort 80 Take 2 puffs first thing in am and then another 2 puffs about 12 hours later.  Plan B =  Backup Only use your albuterol as a rescue medication You do not have significant copd and you never will if you stop all smoking now  If off the lisinopril for a full six weeks and still coughing , please return with all medications in hand    10/28/2016  f/u ov/[name] re:  COPD GOLD 0 / quit smoking x 3 m symb 80 / rare saba Has been on noct 02 x years but wants amb 02  Chief Complaint  Patient presents with  . Follow-up    Pt states needing o2 recert. She only uses o2 2lpm with sleep. No dayime o2.   doe = MMRC3 = can't walk 100 yards even at a slow pace at a flat grade s stopping due to sob  / says 02 levels drop then too and can't complete a HT s stopping rec Please see patient coordinator before you leave today  to schedule overnight pulse oximetry so we can certify you for your concentrator Weight control    12/29/2016  f/u ov/[name] re: GOLD 0 copd/ still smoking maint symb 80 2bid  Chief Complaint  Patient presents with  . Follow-up  Pt on 2 liters at bedtime, Pt has noticed last two months that O2 drops when sitting with no exertion. Pt has SOB with exertion, Pt has lost 13lbs since last visit in April 2018. When pt is exercising at home, O2 drops at gets 3 liters of O2.  Pt coughs up phelm daily thick mucus more when sitting. Pt uses rescue inhaler 3x a week due to more activity.    wakes up feeling good p 3lpm hs but needs to take a nap before lunch every day  saba 2-3 x per week  Can now walk walk [employer] at nl pace and at home x 5 minutes  Prev osa testing neg per pt Most of mucus production is after stirring in am  No obvious day to day or daytime variability or assoc  purulent sputum or mucus plugs or hemoptysis or cp or chest tightness, subjective wheeze or overt sinus or hb symptoms. No unusual exp hx or h/o childhood pna/ asthma or knowledge of premature birth.  Sleeping ok without nocturnal  or early am exacerbation  of respiratory  c/o's or need for noct saba. Also  denies any obvious fluctuation of symptoms with weather or environmental changes or other aggravating or alleviating factors except as outlined above   Current Medications, Allergies, Complete Past Medical History, Past Surgical History, Family History, and Social History were reviewed in [employer] record.  ROS  The following are not active complaints unless bolded sore throat, dysphagia, dental problems, itching, sneezing,  nasal congestion or excess/ purulent secretions, ear ache,   fever, chills, sweats, unintended wt loss, classically pleuritic or exertional cp,  orthopnea pnd or leg swelling, presyncope, palpitations, abdominal pain, anorexia, nausea, vomiting, diarrhea  or change in bowel or bladder habits, change in stools or urine, dysuria,hematuria,  rash, arthralgias, visual complaints, headache, numbness, weakness or ataxia or problems with walking or coordination,  change in mood/affect or memory.                        Objective:   Physical Exam   obese amb wf  With min rattling cough   12/29/2016       270  10/28/2016       282  08/22/2016       285   06/14/16 278 lb 3.2 oz (126.2 kg)  05/24/16 278 lb (126.1 kg)  05/18/16 271 lb 6.4 oz (123.1 kg)    Vital signs reviewed  - Note on arrival 02 sats  90% on RA     HEENT: nl   turbinates, and oropharynx. Nl external ear canals without cough reflex - edentulous     NECK :  without JVD/Nodes/TM/ nl carotid upstrokes bilaterally   LUNGS: no acc muscle use,  Nl contour chest - completely clear bilaterally to A and P   CV:  RRR  no s3 or murmur or increase in P2, nad no edema   ABD:  Red/  soft and nontender with nl inspiratory excursion in the supine position. No bruits or organomegaly appreciated, bowel sounds nl  MS:  Nl gait/ ext warm without deformities, calf tenderness, cyanosis or clubbing No obvious joint restrictions   SKIN: warm and dry without lesions    NEURO:  alert, approp, nl  sensorium with  no motor or cerebellar deficits apparent.            Assessment:

## 2017-01-01 NOTE — Assessment & Plan Note (Signed)
>   3 min Discussed the risks and costs (both direct and indirect)  of smoking relative to the benefits of quitting but patient unwilling to commit at this point to a specific quit date.    Although I don't endorse regular use of e cigs/ many pts find them helpful; however, I emphasized they should be considered a "one-way bridge" off all tobacco products.  

## 2017-01-01 NOTE — Assessment & Plan Note (Signed)
10/28/2016   Walked RA  2 laps @ 185 ft each stopped due to  Sob, sat still 91% at nl pace  - ONO RA  11/09/16  > desat < 89% x 383 min so rec 02 2lpm and repeat ONO on 2lpm - ONO 2lpm 11/23/16  desat x 160 min  > rec 12/05/2016 = repeat ono on 3lpm NP,  - 12/05/2016 notified by Dr Mila Palmer office they would like to make all changes outside of pulmonary rx as pt getting confused with instructions   - ONO 3lpm re- ordered 12/29/2016

## 2017-01-01 NOTE — Assessment & Plan Note (Signed)
Body mass index is 43.55 kg/m.  -  trending down/ encouraged Lab Results  Component Value Date   TSH 0.56 02/25/2016     Contributing to gerd risk/ doe/reviewed the need and the process to achieve and maintain neg calorie balance > defer f/u primary care including intermittently monitoring thyroid status

## 2017-01-01 NOTE — Assessment & Plan Note (Signed)
Spirometry 06/14/2016  FEV1 2.17 (75%)  Ratio 82 with min curvature after am spiriva > d/c'd 06/14/2016  - 06/14/2016    try symbicort 80 2bid   - Spirometry 08/22/2016  No obst p am symb - 08/22/2016   continue symb 80 2bid   - 12/05/2016 notified by Dr Mila Palmer office they would like to make all changes outside of pulmonary rx as pt getting confused with instructions   - 12/29/2016  After extensive coaching HFA effectiveness =   90%   Nothing more to offer here x for cig smoking counseling (see separate a/p)   No change in rx needed but need to be sure to return  with all meds in hand using a trust but verify approach to confirm accurate Medication  Reconciliation The principal here is that until we are certain that the  patients are doing what we've asked, it makes no sense to ask them to do more.    I had an extended discussion with the patient reviewing all relevant studies completed to date and  lasting 15 to 20 minutes of a 25 minute visit    Each maintenance medication was reviewed in detail including most importantly the difference between maintenance and prns and under what circumstances the prns are to be triggered using an action plan format that is not reflected in the computer generated alphabetically organized AVS.    Please see AVS for specific instructions unique to this visit that I personally wrote and verbalized to the the pt in detail and then reviewed with pt  by my nurse highlighting any  changes in therapy recommended at today's visit to their plan of care.

## 2017-01-15 ENCOUNTER — Encounter (HOSPITAL_COMMUNITY): Payer: Self-pay | Admitting: *Deleted

## 2017-01-15 ENCOUNTER — Emergency Department (HOSPITAL_COMMUNITY): Payer: BLUE CROSS/BLUE SHIELD

## 2017-01-15 ENCOUNTER — Emergency Department (HOSPITAL_COMMUNITY)
Admission: EM | Admit: 2017-01-15 | Discharge: 2017-01-16 | Disposition: A | Discharge status: Home / Self Care | Payer: BLUE CROSS/BLUE SHIELD | Attending: Emergency Medicine | Admitting: Emergency Medicine

## 2017-01-15 DIAGNOSIS — Y998 Other external cause status: Secondary | ICD-10-CM | POA: Diagnosis not present

## 2017-01-15 DIAGNOSIS — I509 Heart failure, unspecified: Secondary | ICD-10-CM | POA: Diagnosis not present

## 2017-01-15 DIAGNOSIS — R202 Paresthesia of skin: Secondary | ICD-10-CM | POA: Diagnosis not present

## 2017-01-15 DIAGNOSIS — J449 Chronic obstructive pulmonary disease, unspecified: Secondary | ICD-10-CM | POA: Diagnosis not present

## 2017-01-15 DIAGNOSIS — Z8673 Personal history of transient ischemic attack (TIA), and cerebral infarction without residual deficits: Secondary | ICD-10-CM | POA: Diagnosis not present

## 2017-01-15 DIAGNOSIS — S060X0A Concussion without loss of consciousness, initial encounter: Secondary | ICD-10-CM | POA: Diagnosis not present

## 2017-01-15 DIAGNOSIS — Z7982 Long term (current) use of aspirin: Secondary | ICD-10-CM | POA: Diagnosis not present

## 2017-01-15 DIAGNOSIS — I2510 Atherosclerotic heart disease of native coronary artery without angina pectoris: Secondary | ICD-10-CM | POA: Diagnosis not present

## 2017-01-15 DIAGNOSIS — F4320 Adjustment disorder, unspecified: Secondary | ICD-10-CM

## 2017-01-15 DIAGNOSIS — F419 Anxiety disorder, unspecified: Secondary | ICD-10-CM | POA: Diagnosis not present

## 2017-01-15 DIAGNOSIS — Y92410 Unspecified street and highway as the place of occurrence of the external cause: Secondary | ICD-10-CM | POA: Insufficient documentation

## 2017-01-15 DIAGNOSIS — R079 Chest pain, unspecified: Secondary | ICD-10-CM | POA: Diagnosis present

## 2017-01-15 DIAGNOSIS — Z794 Long term (current) use of insulin: Secondary | ICD-10-CM | POA: Insufficient documentation

## 2017-01-15 DIAGNOSIS — E119 Type 2 diabetes mellitus without complications: Secondary | ICD-10-CM | POA: Diagnosis not present

## 2017-01-15 DIAGNOSIS — Y9389 Activity, other specified: Secondary | ICD-10-CM | POA: Diagnosis not present

## 2017-01-15 DIAGNOSIS — I6789 Other cerebrovascular disease: Secondary | ICD-10-CM | POA: Diagnosis not present

## 2017-01-15 DIAGNOSIS — I110 Hypertensive heart disease with heart failure: Secondary | ICD-10-CM | POA: Insufficient documentation

## 2017-01-15 DIAGNOSIS — Z9981 Dependence on supplemental oxygen: Secondary | ICD-10-CM | POA: Diagnosis not present

## 2017-01-15 DIAGNOSIS — J45909 Unspecified asthma, uncomplicated: Secondary | ICD-10-CM | POA: Insufficient documentation

## 2017-01-15 DIAGNOSIS — S299XXA Unspecified injury of thorax, initial encounter: Secondary | ICD-10-CM | POA: Diagnosis not present

## 2017-01-15 DIAGNOSIS — S0990XA Unspecified injury of head, initial encounter: Secondary | ICD-10-CM | POA: Diagnosis not present

## 2017-01-15 HISTORY — DX: Heart failure, unspecified: I50.9

## 2017-01-15 HISTORY — DX: Atherosclerotic heart disease of native coronary artery without angina pectoris: I25.10

## 2017-01-15 HISTORY — DX: Transient cerebral ischemic attack, unspecified: G45.9

## 2017-01-15 HISTORY — DX: Type 2 diabetes mellitus without complications: E11.9

## 2017-01-15 HISTORY — DX: Bipolar disorder, unspecified: F31.9

## 2017-01-15 HISTORY — DX: Unspecified asthma, uncomplicated: J45.909

## 2017-01-15 LAB — I-STAT CHEM 8, ED
BUN: 20 mg/dL (ref 6–20)
Calcium, Ion: 1.08 mmol/L — ABNORMAL LOW (ref 1.15–1.40)
Chloride: 96 mmol/L — ABNORMAL LOW (ref 101–111)
Creatinine, Ser: 1.4 mg/dL — ABNORMAL HIGH (ref 0.44–1.00)
Glucose, Bld: 284 mg/dL — ABNORMAL HIGH (ref 65–99)
HCT: 41 % (ref 36.0–46.0)
Hemoglobin: 13.9 g/dL (ref 12.0–15.0)
Potassium: 4.4 mmol/L (ref 3.5–5.1)
Sodium: 137 mmol/L (ref 135–145)
TCO2: 29 mmol/L (ref 0–100)

## 2017-01-15 LAB — CBC
HCT: 40.6 % (ref 36.0–46.0)
Hemoglobin: 13 g/dL (ref 12.0–15.0)
MCH: 25.9 pg — ABNORMAL LOW (ref 26.0–34.0)
MCHC: 32 g/dL (ref 30.0–36.0)
MCV: 80.9 fL (ref 78.0–100.0)
Platelets: 200 10*3/uL (ref 150–400)
RBC: 5.02 MIL/uL (ref 3.87–5.11)
RDW: 19.6 % — ABNORMAL HIGH (ref 11.5–15.5)
WBC: 14.1 10*3/uL — ABNORMAL HIGH (ref 4.0–10.5)

## 2017-01-15 LAB — COMPREHENSIVE METABOLIC PANEL
ALT: 30 U/L (ref 14–54)
AST: 25 U/L (ref 15–41)
Albumin: 3.8 g/dL (ref 3.5–5.0)
Alkaline Phosphatase: 103 U/L (ref 38–126)
Anion gap: 12 (ref 5–15)
BUN: 16 mg/dL (ref 6–20)
CO2: 26 mmol/L (ref 22–32)
Calcium: 9.5 mg/dL (ref 8.9–10.3)
Chloride: 96 mmol/L — ABNORMAL LOW (ref 101–111)
Creatinine, Ser: 1.37 mg/dL — ABNORMAL HIGH (ref 0.44–1.00)
GFR calc Af Amer: 52 mL/min — ABNORMAL LOW (ref >60)
GFR calc non Af Amer: 45 mL/min — ABNORMAL LOW (ref >60)
Glucose, Bld: 287 mg/dL — ABNORMAL HIGH (ref 65–99)
Potassium: 4.5 mmol/L (ref 3.5–5.1)
Sodium: 134 mmol/L — ABNORMAL LOW (ref 135–145)
Total Bilirubin: 0.7 mg/dL (ref 0.3–1.2)
Total Protein: 7.8 g/dL (ref 6.5–8.1)

## 2017-01-15 LAB — RAPID URINE DRUG SCREEN, HOSP PERFORMED
Amphetamines: NOT DETECTED
Barbiturates: NOT DETECTED
Benzodiazepines: NOT DETECTED
Cocaine: NOT DETECTED
Opiates: NOT DETECTED
Tetrahydrocannabinol: NOT DETECTED

## 2017-01-15 LAB — URINALYSIS, ROUTINE W REFLEX MICROSCOPIC
Bilirubin Urine: NEGATIVE
Glucose, UA: NEGATIVE mg/dL
Ketones, ur: NEGATIVE mg/dL
Leukocytes, UA: NEGATIVE
Nitrite: NEGATIVE
Protein, ur: NEGATIVE mg/dL
Specific Gravity, Urine: 1.011 (ref 1.005–1.030)
pH: 5 (ref 5.0–8.0)

## 2017-01-15 LAB — ETHANOL: Alcohol, Ethyl (B): <5 mg/dL (ref <5)
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
## 2017-01-15 NOTE — ED Notes (Signed)
Pt taken to CT.
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
## 2017-01-15 NOTE — Discharge Instructions (Signed)
Follow-up with your physician as needed.

## 2017-01-16 ENCOUNTER — Encounter: Payer: Self-pay | Admitting: Internal Medicine

## 2017-01-23 ENCOUNTER — Telehealth: Payer: Self-pay | Admitting: Internal Medicine

## 2017-01-23 DIAGNOSIS — J9611 Chronic respiratory failure with hypoxia: Secondary | ICD-10-CM

## 2017-01-23 NOTE — Telephone Encounter (Signed)
Per MW- ONO on 3lpm was too low (done by [place] on 01/18/17, needs to increase o2 to 4lpm and repeat ONO  Spoke with pt and notified of results per Dr. Melvyn Novas. Pt verbalized understanding and denied any questions. She states having increased SOB just at rest- ov with MW 01/24/17, ED sooner if needed  I ordered the repeat ONO

## 2017-01-24 ENCOUNTER — Ambulatory Visit: Payer: Medicare Other | Admitting: Internal Medicine

## 2017-01-25 DIAGNOSIS — I10 Essential (primary) hypertension: Secondary | ICD-10-CM | POA: Diagnosis not present

## 2017-01-25 DIAGNOSIS — R0989 Other specified symptoms and signs involving the circulatory and respiratory systems: Secondary | ICD-10-CM | POA: Diagnosis not present

## 2017-01-25 DIAGNOSIS — I5033 Acute on chronic diastolic (congestive) heart failure: Secondary | ICD-10-CM | POA: Diagnosis not present

## 2017-02-02 ENCOUNTER — Encounter: Payer: Self-pay | Admitting: Internal Medicine

## 2017-02-06 ENCOUNTER — Telehealth: Payer: Self-pay | Admitting: Internal Medicine

## 2017-02-06 DIAGNOSIS — G4733 Obstructive sleep apnea (adult) (pediatric): Secondary | ICD-10-CM

## 2017-02-06 NOTE — Telephone Encounter (Signed)
Received results on ONO on 4lpm from [facility] done 02/02/17  Per MW- pt needs sleep study   I have sent order to [facility]  Nothing further needed

## 2017-02-07 DIAGNOSIS — E113593 Type 2 diabetes mellitus with proliferative diabetic retinopathy without macular edema, bilateral: Secondary | ICD-10-CM | POA: Diagnosis not present

## 2017-02-07 DIAGNOSIS — H4311 Vitreous hemorrhage, right eye: Secondary | ICD-10-CM | POA: Diagnosis not present

## 2017-02-10 DIAGNOSIS — H4313 Vitreous hemorrhage, bilateral: Secondary | ICD-10-CM | POA: Diagnosis not present

## 2017-02-10 DIAGNOSIS — H43811 Vitreous degeneration, right eye: Secondary | ICD-10-CM | POA: Diagnosis not present

## 2017-02-10 DIAGNOSIS — E113513 Type 2 diabetes mellitus with proliferative diabetic retinopathy with macular edema, bilateral: Secondary | ICD-10-CM | POA: Diagnosis not present

## 2017-02-10 DIAGNOSIS — H2513 Age-related nuclear cataract, bilateral: Secondary | ICD-10-CM | POA: Diagnosis not present

## 2017-02-14 IMAGING — CR DG CHEST 2V
2 series · 2 of 2 positions shown · non-contrast
Comparison: 11/03/2015

CLINICAL DATA: COPD, asthma

EXAM:
CHEST  2 VIEW

[chest pa]
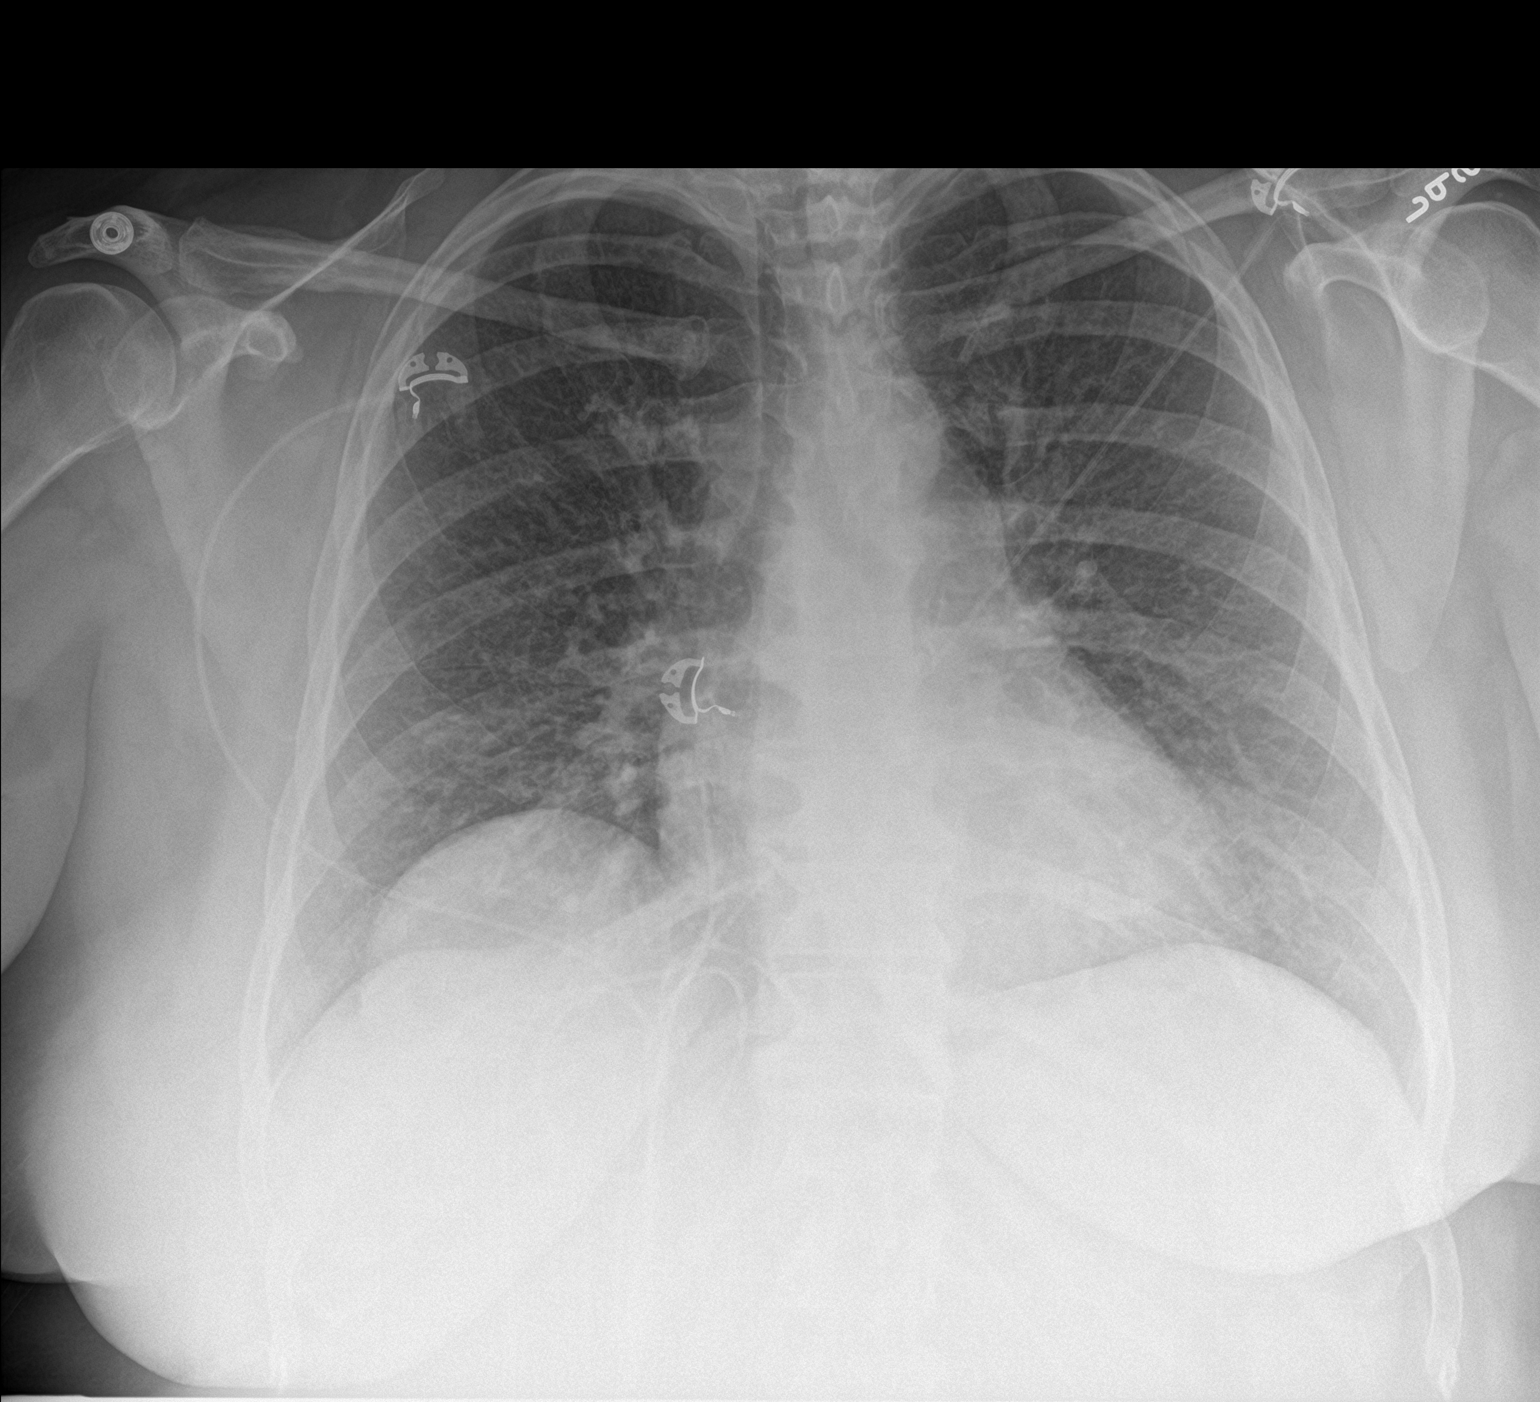

[chest lat]
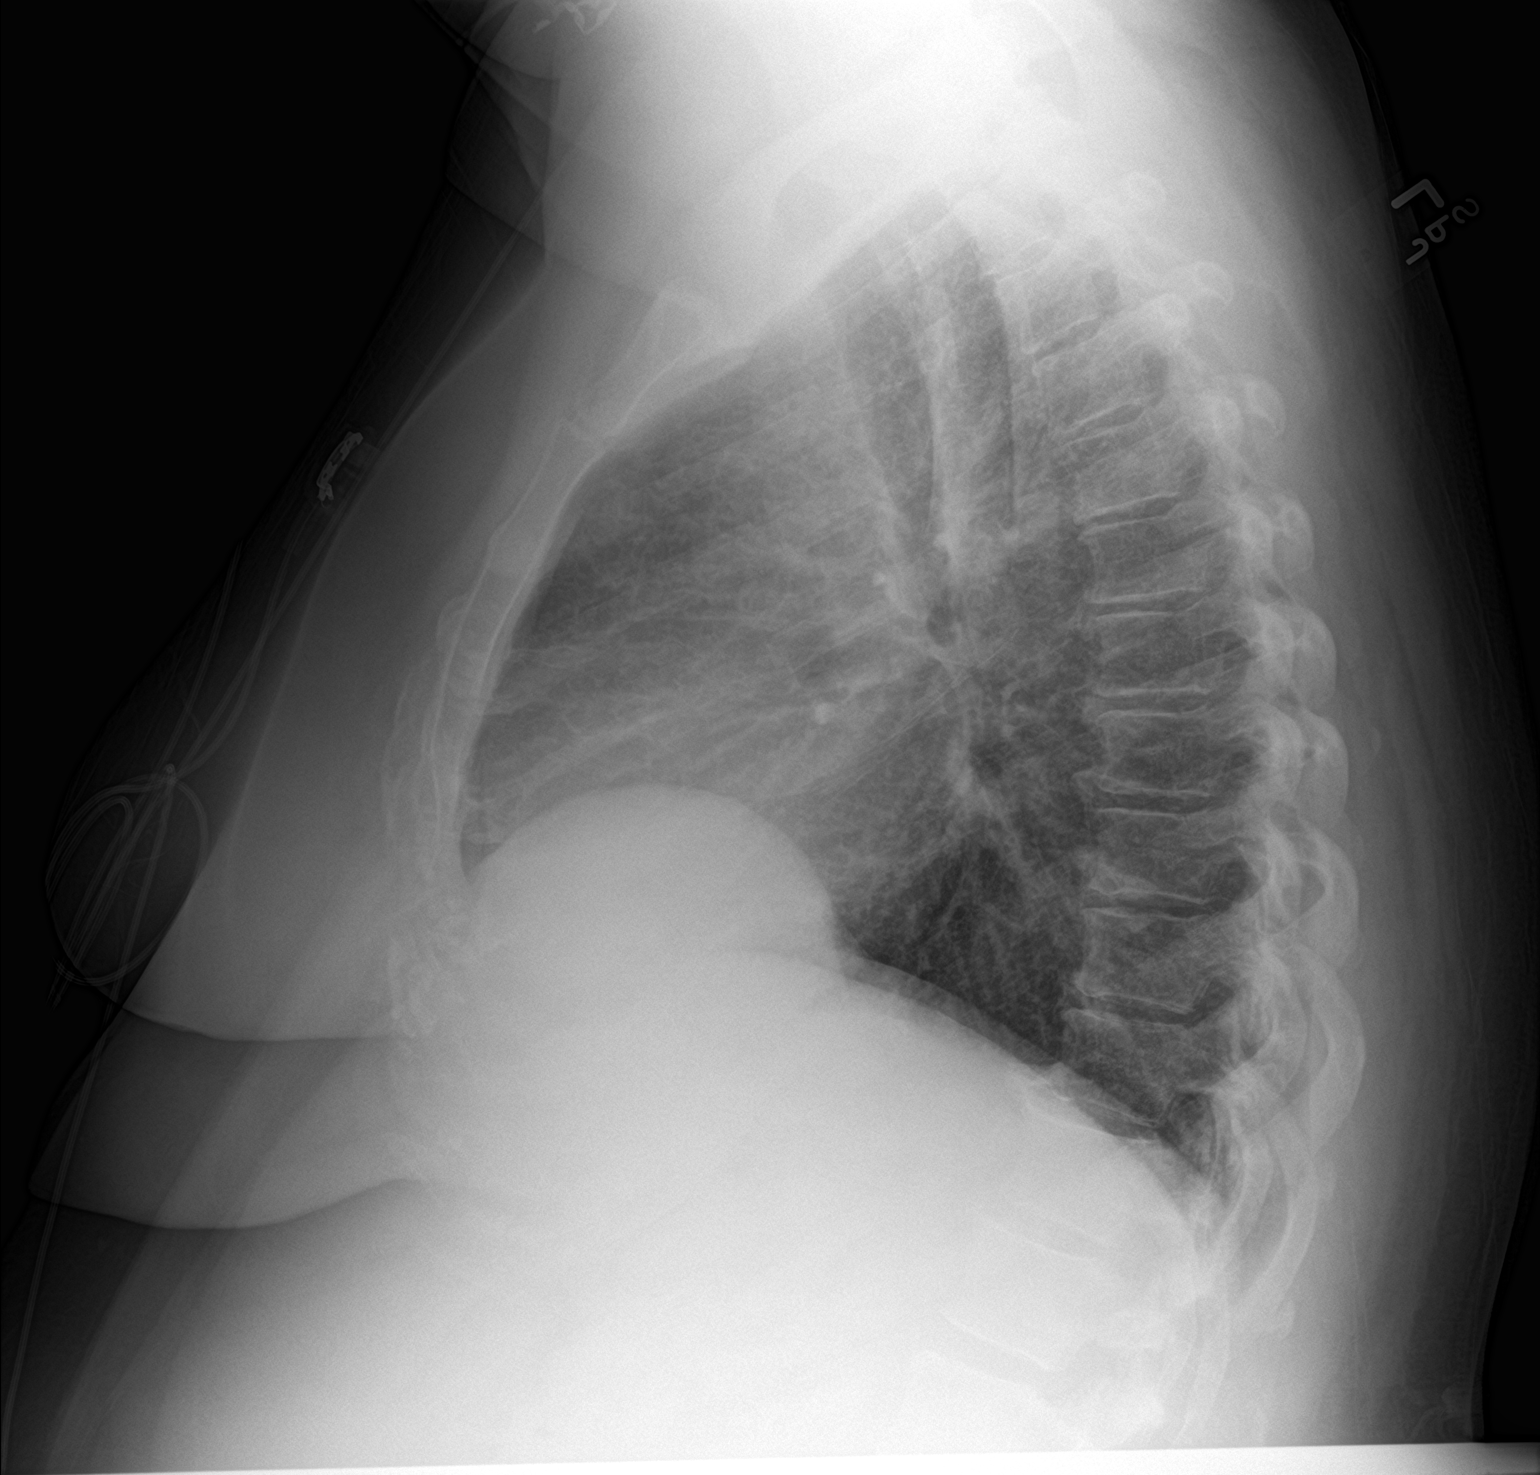

[2 of 2 positions shown; findings below may reference images not displayed]

FINDINGS: Cardiomediastinal silhouette is stable. No infiltrate or pulmonary
edema. Mild perihilar bronchitic changes. Stable tenting of the
right hemidiaphragm. Mild degenerative changes thoracic spine pre
IMPRESSION: No infiltrate or pulmonary edema. Mild perihilar bronchitic changes.
Tenting of the right hemidiaphragm again noted. Degenerative changes
thoracic spine.

.

## 2017-02-24 ENCOUNTER — Emergency Department (HOSPITAL_COMMUNITY): Payer: BLUE CROSS/BLUE SHIELD

## 2017-02-24 ENCOUNTER — Encounter (HOSPITAL_COMMUNITY): Payer: Self-pay | Admitting: *Deleted

## 2017-02-24 DIAGNOSIS — H538 Other visual disturbances: Secondary | ICD-10-CM | POA: Insufficient documentation

## 2017-02-24 DIAGNOSIS — H47619 Cortical blindness, unspecified side of brain: Secondary | ICD-10-CM | POA: Diagnosis not present

## 2017-02-24 DIAGNOSIS — E113513 Type 2 diabetes mellitus with proliferative diabetic retinopathy with macular edema, bilateral: Secondary | ICD-10-CM | POA: Diagnosis not present

## 2017-02-24 DIAGNOSIS — H4313 Vitreous hemorrhage, bilateral: Secondary | ICD-10-CM | POA: Diagnosis not present

## 2017-02-24 DIAGNOSIS — R9431 Abnormal electrocardiogram [ECG] [EKG]: Secondary | ICD-10-CM | POA: Diagnosis not present

## 2017-02-24 DIAGNOSIS — Z5321 Procedure and treatment not carried out due to patient leaving prior to being seen by health care provider: Secondary | ICD-10-CM | POA: Insufficient documentation

## 2017-02-24 DIAGNOSIS — I69398 Other sequelae of cerebral infarction: Secondary | ICD-10-CM | POA: Diagnosis not present

## 2017-02-24 LAB — COMPREHENSIVE METABOLIC PANEL
ALT: 33 U/L (ref 14–54)
AST: 35 U/L (ref 15–41)
Albumin: 3.9 g/dL (ref 3.5–5.0)
Alkaline Phosphatase: 134 U/L — ABNORMAL HIGH (ref 38–126)
Anion gap: 13 (ref 5–15)
BUN: 30 mg/dL — ABNORMAL HIGH (ref 6–20)
CO2: 29 mmol/L (ref 22–32)
Calcium: 9.3 mg/dL (ref 8.9–10.3)
Chloride: 93 mmol/L — ABNORMAL LOW (ref 101–111)
Creatinine, Ser: 1.35 mg/dL — ABNORMAL HIGH (ref 0.44–1.00)
GFR calc Af Amer: 53 mL/min — ABNORMAL LOW (ref >60)
GFR calc non Af Amer: 46 mL/min — ABNORMAL LOW (ref >60)
Glucose, Bld: 387 mg/dL — ABNORMAL HIGH (ref 65–99)
Potassium: 4 mmol/L (ref 3.5–5.1)
Sodium: 135 mmol/L (ref 135–145)
Total Bilirubin: 0.8 mg/dL (ref 0.3–1.2)
Total Protein: 8.5 g/dL — ABNORMAL HIGH (ref 6.5–8.1)

## 2017-02-24 LAB — PROTIME-INR
INR: 1.07
Prothrombin Time: 13.9 seconds (ref 11.4–15.2)

## 2017-02-24 LAB — CBC
HCT: 40.2 % (ref 36.0–46.0)
Hemoglobin: 13.4 g/dL (ref 12.0–15.0)
MCH: 27 pg (ref 26.0–34.0)
MCHC: 33.3 g/dL (ref 30.0–36.0)
MCV: 80.9 fL (ref 78.0–100.0)
Platelets: 198 10*3/uL (ref 150–400)
RBC: 4.97 MIL/uL (ref 3.87–5.11)
RDW: 20 % — ABNORMAL HIGH (ref 11.5–15.5)
WBC: 14.1 10*3/uL — ABNORMAL HIGH (ref 4.0–10.5)

## 2017-02-24 LAB — DIFFERENTIAL
Basophils Absolute: 0.1 10*3/uL (ref 0.0–0.1)
Basophils Relative: 1 %
Eosinophils Absolute: 0.5 10*3/uL (ref 0.0–0.7)
Eosinophils Relative: 3 %
Lymphocytes Relative: 18 %
Lymphs Abs: 2.5 10*3/uL (ref 0.7–4.0)
Monocytes Absolute: 0.7 10*3/uL (ref 0.1–1.0)
Monocytes Relative: 5 %
Neutro Abs: 10.4 10*3/uL — ABNORMAL HIGH (ref 1.7–7.7)
Neutrophils Relative %: 73 %

## 2017-02-24 LAB — I-STAT CHEM 8, ED
BUN: 34 mg/dL — ABNORMAL HIGH (ref 6–20)
Calcium, Ion: 1.02 mmol/L — ABNORMAL LOW (ref 1.15–1.40)
Chloride: 93 mmol/L — ABNORMAL LOW (ref 101–111)
Creatinine, Ser: 1.2 mg/dL — ABNORMAL HIGH (ref 0.44–1.00)
Glucose, Bld: 394 mg/dL — ABNORMAL HIGH (ref 65–99)
HCT: 43 % (ref 36.0–46.0)
Hemoglobin: 14.6 g/dL (ref 12.0–15.0)
Potassium: 3.9 mmol/L (ref 3.5–5.1)
Sodium: 134 mmol/L — ABNORMAL LOW (ref 135–145)
TCO2: 29 mmol/L (ref 0–100)

## 2017-02-24 LAB — I-STAT TROPONIN, ED: Troponin i, poc: 0 ng/mL (ref 0.00–0.08)

## 2017-02-24 LAB — APTT: aPTT: 31 seconds (ref 24–36)

## 2017-02-24 NOTE — ED Triage Notes (Signed)
Pt reports complete blindness onset today @ 1am, pt denies speech changes, denies facial droop, pt denies weakness, no facial droop, reports unsteady gait, pt A&O x4

## 2017-02-24 NOTE — ED Triage Notes (Signed)
Per Little, MD, pts eye MD sent pt here for concern for stroke, pt had eye exam today & reports blurred vision, pt to have stroke standing orders placed in triage, pt arrives to ED with dilated pupils accompanied by family

## 2017-02-25 ENCOUNTER — Emergency Department (HOSPITAL_COMMUNITY)
Admission: EM | Admit: 2017-02-25 | Discharge: 2017-02-25 | Disposition: A | Discharge status: Home / Self Care | Payer: BLUE CROSS/BLUE SHIELD | Attending: Emergency Medicine | Admitting: Emergency Medicine

## 2017-02-25 NOTE — ED Notes (Signed)
Pt does not respond when called for a room x 3

## 2017-02-27 ENCOUNTER — Observation Stay (HOSPITAL_COMMUNITY)
Admission: EM | Admit: 2017-02-27 | Discharge: 2017-02-28 | Discharge status: Against Medical Advice | Payer: BLUE CROSS/BLUE SHIELD | Attending: Internal Medicine | Admitting: Internal Medicine

## 2017-02-27 ENCOUNTER — Telehealth: Payer: Self-pay | Admitting: Internal Medicine

## 2017-02-27 ENCOUNTER — Encounter (HOSPITAL_COMMUNITY): Payer: Self-pay | Admitting: *Deleted

## 2017-02-27 DIAGNOSIS — I5033 Acute on chronic diastolic (congestive) heart failure: Secondary | ICD-10-CM | POA: Diagnosis not present

## 2017-02-27 DIAGNOSIS — K219 Gastro-esophageal reflux disease without esophagitis: Secondary | ICD-10-CM | POA: Diagnosis not present

## 2017-02-27 DIAGNOSIS — I69354 Hemiplegia and hemiparesis following cerebral infarction affecting left non-dominant side: Secondary | ICD-10-CM | POA: Diagnosis not present

## 2017-02-27 DIAGNOSIS — I130 Hypertensive heart and chronic kidney disease with heart failure and stage 1 through stage 4 chronic kidney disease, or unspecified chronic kidney disease: Secondary | ICD-10-CM | POA: Diagnosis not present

## 2017-02-27 DIAGNOSIS — F418 Other specified anxiety disorders: Secondary | ICD-10-CM | POA: Diagnosis present

## 2017-02-27 DIAGNOSIS — R4701 Aphasia: Secondary | ICD-10-CM | POA: Insufficient documentation

## 2017-02-27 DIAGNOSIS — E1122 Type 2 diabetes mellitus with diabetic chronic kidney disease: Secondary | ICD-10-CM | POA: Diagnosis not present

## 2017-02-27 DIAGNOSIS — G4733 Obstructive sleep apnea (adult) (pediatric): Secondary | ICD-10-CM | POA: Insufficient documentation

## 2017-02-27 DIAGNOSIS — J9611 Chronic respiratory failure with hypoxia: Secondary | ICD-10-CM | POA: Diagnosis present

## 2017-02-27 DIAGNOSIS — F411 Generalized anxiety disorder: Secondary | ICD-10-CM | POA: Diagnosis not present

## 2017-02-27 DIAGNOSIS — I5032 Chronic diastolic (congestive) heart failure: Secondary | ICD-10-CM | POA: Diagnosis present

## 2017-02-27 DIAGNOSIS — Z9981 Dependence on supplemental oxygen: Secondary | ICD-10-CM | POA: Insufficient documentation

## 2017-02-27 DIAGNOSIS — Z86718 Personal history of other venous thrombosis and embolism: Secondary | ICD-10-CM | POA: Diagnosis not present

## 2017-02-27 DIAGNOSIS — N182 Chronic kidney disease, stage 2 (mild): Secondary | ICD-10-CM | POA: Diagnosis not present

## 2017-02-27 DIAGNOSIS — Z7982 Long term (current) use of aspirin: Secondary | ICD-10-CM | POA: Diagnosis not present

## 2017-02-27 DIAGNOSIS — Z794 Long term (current) use of insulin: Secondary | ICD-10-CM | POA: Diagnosis not present

## 2017-02-27 DIAGNOSIS — I10 Essential (primary) hypertension: Secondary | ICD-10-CM | POA: Diagnosis present

## 2017-02-27 DIAGNOSIS — E781 Pure hyperglyceridemia: Secondary | ICD-10-CM | POA: Diagnosis not present

## 2017-02-27 DIAGNOSIS — IMO0002 Reserved for concepts with insufficient information to code with codable children: Secondary | ICD-10-CM | POA: Diagnosis present

## 2017-02-27 DIAGNOSIS — F319 Bipolar disorder, unspecified: Secondary | ICD-10-CM | POA: Insufficient documentation

## 2017-02-27 DIAGNOSIS — G4730 Sleep apnea, unspecified: Secondary | ICD-10-CM | POA: Diagnosis present

## 2017-02-27 DIAGNOSIS — H539 Unspecified visual disturbance: Secondary | ICD-10-CM | POA: Diagnosis not present

## 2017-02-27 DIAGNOSIS — I152 Hypertension secondary to endocrine disorders: Secondary | ICD-10-CM | POA: Diagnosis present

## 2017-02-27 DIAGNOSIS — H543 Unqualified visual loss, both eyes: Secondary | ICD-10-CM | POA: Diagnosis not present

## 2017-02-27 DIAGNOSIS — R609 Edema, unspecified: Secondary | ICD-10-CM

## 2017-02-27 DIAGNOSIS — I6521 Occlusion and stenosis of right carotid artery: Secondary | ICD-10-CM | POA: Insufficient documentation

## 2017-02-27 DIAGNOSIS — I4510 Unspecified right bundle-branch block: Secondary | ICD-10-CM | POA: Insufficient documentation

## 2017-02-27 DIAGNOSIS — I63231 Cerebral infarction due to unspecified occlusion or stenosis of right carotid arteries: Secondary | ICD-10-CM | POA: Diagnosis not present

## 2017-02-27 DIAGNOSIS — E785 Hyperlipidemia, unspecified: Secondary | ICD-10-CM | POA: Insufficient documentation

## 2017-02-27 DIAGNOSIS — Z880 Allergy status to penicillin: Secondary | ICD-10-CM | POA: Insufficient documentation

## 2017-02-27 DIAGNOSIS — Z6841 Body Mass Index (BMI) 40.0 and over, adult: Secondary | ICD-10-CM | POA: Diagnosis not present

## 2017-02-27 DIAGNOSIS — E1165 Type 2 diabetes mellitus with hyperglycemia: Secondary | ICD-10-CM | POA: Insufficient documentation

## 2017-02-27 DIAGNOSIS — E041 Nontoxic single thyroid nodule: Secondary | ICD-10-CM | POA: Diagnosis not present

## 2017-02-27 DIAGNOSIS — I2510 Atherosclerotic heart disease of native coronary artery without angina pectoris: Secondary | ICD-10-CM | POA: Insufficient documentation

## 2017-02-27 DIAGNOSIS — Z79899 Other long term (current) drug therapy: Secondary | ICD-10-CM | POA: Insufficient documentation

## 2017-02-27 DIAGNOSIS — E119 Type 2 diabetes mellitus without complications: Secondary | ICD-10-CM

## 2017-02-27 DIAGNOSIS — Z885 Allergy status to narcotic agent status: Secondary | ICD-10-CM | POA: Insufficient documentation

## 2017-02-27 DIAGNOSIS — H547 Unspecified visual loss: Secondary | ICD-10-CM | POA: Diagnosis not present

## 2017-02-27 LAB — COMPREHENSIVE METABOLIC PANEL
ALT: 30 U/L (ref 14–54)
AST: 29 U/L (ref 15–41)
Albumin: 4 g/dL (ref 3.5–5.0)
Alkaline Phosphatase: 132 U/L — ABNORMAL HIGH (ref 38–126)
Anion gap: 13 (ref 5–15)
BUN: 35 mg/dL — ABNORMAL HIGH (ref 6–20)
CO2: 25 mmol/L (ref 22–32)
Calcium: 9.5 mg/dL (ref 8.9–10.3)
Chloride: 92 mmol/L — ABNORMAL LOW (ref 101–111)
Creatinine, Ser: 1.31 mg/dL — ABNORMAL HIGH (ref 0.44–1.00)
GFR calc Af Amer: 55 mL/min — ABNORMAL LOW (ref >60)
GFR calc non Af Amer: 47 mL/min — ABNORMAL LOW (ref >60)
Glucose, Bld: 335 mg/dL — ABNORMAL HIGH (ref 65–99)
Potassium: 4.6 mmol/L (ref 3.5–5.1)
Sodium: 130 mmol/L — ABNORMAL LOW (ref 135–145)
Total Bilirubin: 0.8 mg/dL (ref 0.3–1.2)
Total Protein: 8.7 g/dL — ABNORMAL HIGH (ref 6.5–8.1)

## 2017-02-27 LAB — DIFFERENTIAL
Basophils Absolute: 0.1 10*3/uL (ref 0.0–0.1)
Basophils Relative: 1 %
Eosinophils Absolute: 0.4 10*3/uL (ref 0.0–0.7)
Eosinophils Relative: 3 %
Lymphocytes Relative: 18 %
Lymphs Abs: 2.6 10*3/uL (ref 0.7–4.0)
Monocytes Absolute: 0.8 10*3/uL (ref 0.1–1.0)
Monocytes Relative: 5 %
Neutro Abs: 10.7 10*3/uL — ABNORMAL HIGH (ref 1.7–7.7)
Neutrophils Relative %: 74 %

## 2017-02-27 LAB — PROTIME-INR
INR: 1.09
Prothrombin Time: 14.1 seconds (ref 11.4–15.2)

## 2017-02-27 LAB — CBC
HCT: 41.4 % (ref 36.0–46.0)
Hemoglobin: 13.4 g/dL (ref 12.0–15.0)
MCH: 26.6 pg (ref 26.0–34.0)
MCHC: 32.4 g/dL (ref 30.0–36.0)
MCV: 82.3 fL (ref 78.0–100.0)
Platelets: 194 10*3/uL (ref 150–400)
RBC: 5.03 MIL/uL (ref 3.87–5.11)
RDW: 19.8 % — ABNORMAL HIGH (ref 11.5–15.5)
WBC: 14.5 10*3/uL — ABNORMAL HIGH (ref 4.0–10.5)

## 2017-02-27 LAB — APTT: aPTT: 31 seconds (ref 24–36)

## 2017-02-27 LAB — I-STAT TROPONIN, ED: Troponin i, poc: 0 ng/mL (ref 0.00–0.08)

## 2017-02-27 LAB — CBG MONITORING, ED: Glucose-Capillary: 328 mg/dL — ABNORMAL HIGH (ref 65–99)

## 2017-02-27 NOTE — ED Triage Notes (Signed)
Pt says that about 1 am on Friday night she was changing the sheets and she lost vision in both eyes. Pt says she is seeing "black" in both eyes. PT saw her eye doctor in Friday and was directed to be seen in the ED, she came and had to leave before she could be evaluated (had ct, blood work, ekg done). Pt denies any further symptoms such as weakness or slurring of her speech. Pt says she has been having some headaches, but are "usual" for her.

## 2017-02-27 NOTE — Telephone Encounter (Signed)
Spoke with pt's husband. States that is having a rough time. Reports her going blind on Thursday. Pt's husband took the pt to the ED and several tests run. They were told that she had a mini stroke due to lack of oxygen to her brain. He wanted to make an appointment to discuss this. Pt has been scheduled with SG on 03/09/17 at 10:15am. Nothing further was needed.

## 2017-02-27 NOTE — ED Notes (Signed)
Spoke with Dr. Rex Kras about pt, please order screening labs at this time, no additional CT orders at this time

## 2017-02-28 ENCOUNTER — Observation Stay (HOSPITAL_COMMUNITY): Payer: BLUE CROSS/BLUE SHIELD

## 2017-02-28 ENCOUNTER — Encounter (HOSPITAL_COMMUNITY): Payer: Self-pay | Admitting: Family Medicine

## 2017-02-28 ENCOUNTER — Other Ambulatory Visit: Payer: Self-pay

## 2017-02-28 ENCOUNTER — Emergency Department (HOSPITAL_COMMUNITY): Payer: BLUE CROSS/BLUE SHIELD

## 2017-02-28 DIAGNOSIS — J4530 Mild persistent asthma, uncomplicated: Secondary | ICD-10-CM

## 2017-02-28 DIAGNOSIS — F418 Other specified anxiety disorders: Secondary | ICD-10-CM

## 2017-02-28 DIAGNOSIS — J9611 Chronic respiratory failure with hypoxia: Secondary | ICD-10-CM

## 2017-02-28 DIAGNOSIS — H539 Unspecified visual disturbance: Secondary | ICD-10-CM

## 2017-02-28 DIAGNOSIS — I10 Essential (primary) hypertension: Secondary | ICD-10-CM

## 2017-02-28 DIAGNOSIS — N182 Chronic kidney disease, stage 2 (mild): Secondary | ICD-10-CM | POA: Diagnosis not present

## 2017-02-28 DIAGNOSIS — I63231 Cerebral infarction due to unspecified occlusion or stenosis of right carotid arteries: Secondary | ICD-10-CM | POA: Diagnosis not present

## 2017-02-28 DIAGNOSIS — E118 Type 2 diabetes mellitus with unspecified complications: Secondary | ICD-10-CM | POA: Diagnosis not present

## 2017-02-28 DIAGNOSIS — I5032 Chronic diastolic (congestive) heart failure: Secondary | ICD-10-CM

## 2017-02-28 DIAGNOSIS — E1165 Type 2 diabetes mellitus with hyperglycemia: Secondary | ICD-10-CM | POA: Diagnosis not present

## 2017-02-28 DIAGNOSIS — G4733 Obstructive sleep apnea (adult) (pediatric): Secondary | ICD-10-CM

## 2017-02-28 DIAGNOSIS — H543 Unqualified visual loss, both eyes: Secondary | ICD-10-CM | POA: Insufficient documentation

## 2017-02-28 DIAGNOSIS — Z794 Long term (current) use of insulin: Secondary | ICD-10-CM | POA: Diagnosis not present

## 2017-02-28 DIAGNOSIS — H547 Unspecified visual loss: Secondary | ICD-10-CM | POA: Diagnosis not present

## 2017-02-28 DIAGNOSIS — F319 Bipolar disorder, unspecified: Secondary | ICD-10-CM | POA: Diagnosis not present

## 2017-02-28 LAB — URINALYSIS, ROUTINE W REFLEX MICROSCOPIC
Bacteria, UA: NONE SEEN
Bilirubin Urine: NEGATIVE
Glucose, UA: >=500 mg/dL — AB
Ketones, ur: NEGATIVE mg/dL
Leukocytes, UA: NEGATIVE
Nitrite: NEGATIVE
Protein, ur: 30 mg/dL — AB
Specific Gravity, Urine: 1.027 (ref 1.005–1.030)
pH: 5 (ref 5.0–8.0)

## 2017-02-28 LAB — GLUCOSE, CAPILLARY
Glucose-Capillary: 359 mg/dL — ABNORMAL HIGH (ref 65–99)
Glucose-Capillary: 398 mg/dL — ABNORMAL HIGH (ref 65–99)
Glucose-Capillary: 433 mg/dL — ABNORMAL HIGH (ref 65–99)

## 2017-02-28 LAB — BASIC METABOLIC PANEL
Anion gap: 11 (ref 5–15)
BUN: 38 mg/dL — ABNORMAL HIGH (ref 6–20)
CO2: 29 mmol/L (ref 22–32)
Calcium: 9.8 mg/dL (ref 8.9–10.3)
Chloride: 94 mmol/L — ABNORMAL LOW (ref 101–111)
Creatinine, Ser: 1.26 mg/dL — ABNORMAL HIGH (ref 0.44–1.00)
GFR calc Af Amer: 57 mL/min — ABNORMAL LOW (ref >60)
GFR calc non Af Amer: 50 mL/min — ABNORMAL LOW (ref >60)
Glucose, Bld: 329 mg/dL — ABNORMAL HIGH (ref 65–99)
Potassium: 4.5 mmol/L (ref 3.5–5.1)
Sodium: 134 mmol/L — ABNORMAL LOW (ref 135–145)

## 2017-02-28 LAB — I-STAT CHEM 8, ED
BUN: 41 mg/dL — ABNORMAL HIGH (ref 6–20)
Calcium, Ion: 1.16 mmol/L (ref 1.15–1.40)
Chloride: 93 mmol/L — ABNORMAL LOW (ref 101–111)
Creatinine, Ser: 1.3 mg/dL — ABNORMAL HIGH (ref 0.44–1.00)
Glucose, Bld: 376 mg/dL — ABNORMAL HIGH (ref 65–99)
HCT: 43 % (ref 36.0–46.0)
Hemoglobin: 14.6 g/dL (ref 12.0–15.0)
Potassium: 4.5 mmol/L (ref 3.5–5.1)
Sodium: 134 mmol/L — ABNORMAL LOW (ref 135–145)
TCO2: 30 mmol/L (ref 0–100)

## 2017-02-28 LAB — RAPID URINE DRUG SCREEN, HOSP PERFORMED
Amphetamines: NOT DETECTED
Barbiturates: NOT DETECTED
Benzodiazepines: NOT DETECTED
Cocaine: NOT DETECTED
Opiates: NOT DETECTED
Tetrahydrocannabinol: NOT DETECTED

## 2017-02-28 LAB — LIPID PANEL
Cholesterol: 178 mg/dL (ref 0–200)
HDL: 33 mg/dL — ABNORMAL LOW (ref >40)
LDL Cholesterol: 86 mg/dL (ref 0–99)
Total CHOL/HDL Ratio: 5.4 RATIO
Triglycerides: 295 mg/dL — ABNORMAL HIGH (ref <150)
VLDL: 59 mg/dL — ABNORMAL HIGH (ref 0–40)

## 2017-02-28 LAB — CBC
HCT: 41.8 % (ref 36.0–46.0)
Hemoglobin: 13.5 g/dL (ref 12.0–15.0)
MCH: 26.5 pg (ref 26.0–34.0)
MCHC: 32.3 g/dL (ref 30.0–36.0)
MCV: 82.1 fL (ref 78.0–100.0)
Platelets: 204 10*3/uL (ref 150–400)
RBC: 5.09 MIL/uL (ref 3.87–5.11)
RDW: 19.9 % — ABNORMAL HIGH (ref 11.5–15.5)
WBC: 15 10*3/uL — ABNORMAL HIGH (ref 4.0–10.5)

## 2017-02-28 LAB — ETHANOL: Alcohol, Ethyl (B): <5 mg/dL (ref <5)

## 2017-02-28 LAB — HEMOGLOBIN A1C
Hgb A1c MFr Bld: 12.2 % — ABNORMAL HIGH (ref 4.8–5.6)
Mean Plasma Glucose: 303.44 mg/dL

## 2017-02-28 LAB — CBG MONITORING, ED: Glucose-Capillary: 316 mg/dL — ABNORMAL HIGH (ref 65–99)

## 2017-02-28 LAB — HIV ANTIBODY (ROUTINE TESTING W REFLEX): HIV Screen 4th Generation wRfx: NONREACTIVE

## 2017-02-28 LAB — GLUCOSE, RANDOM: Glucose, Bld: 405 mg/dL — ABNORMAL HIGH (ref 65–99)

## 2017-02-28 MED ORDER — INSULIN DETEMIR 100 UNIT/ML ~~LOC~~ SOLN
40.0000 [IU] | Freq: Two times a day (BID) | SUBCUTANEOUS | Status: DC
  Administered 2017-02-28: 40 [IU] via SUBCUTANEOUS
  Filled 2017-02-28: qty 0.4

## 2017-02-28 MED ORDER — SODIUM CHLORIDE 0.9% FLUSH: 3.0000 mL | INTRAVENOUS | Status: DC | PRN

## 2017-02-28 MED ORDER — INSULIN ASPART 100 UNIT/ML ~~LOC~~ SOLN: 0.0000 [IU] | Freq: Every day | SUBCUTANEOUS | Status: DC

## 2017-02-28 MED ORDER — SENNOSIDES-DOCUSATE SODIUM 8.6-50 MG PO TABS: 1.0000 | ORAL_TABLET | Freq: Every evening | ORAL | Status: DC | PRN

## 2017-02-28 MED ORDER — PREGABALIN 100 MG PO CAPS
300.0000 mg | ORAL_CAPSULE | Freq: Two times a day (BID) | ORAL | Status: DC
Start: 2017-02-28 — End: 2017-02-28
  Administered 2017-02-28: 300 mg via ORAL
  Filled 2017-02-28: qty 3

## 2017-02-28 MED ORDER — ATORVASTATIN CALCIUM 40 MG PO TABS: 40.0000 mg | ORAL_TABLET | Freq: Every day | ORAL | Status: DC

## 2017-02-28 MED ORDER — INSULIN ASPART 100 UNIT/ML ~~LOC~~ SOLN
25.0000 [IU] | Freq: Once | SUBCUTANEOUS | Status: AC
  Administered 2017-02-28: 25 [IU] via SUBCUTANEOUS

## 2017-02-28 MED ORDER — SPIRONOLACTONE 25 MG PO TABS
25.0000 mg | ORAL_TABLET | Freq: Two times a day (BID) | ORAL | Status: DC
  Administered 2017-02-28: 25 mg via ORAL
  Filled 2017-02-28: qty 1

## 2017-02-28 MED ORDER — FUROSEMIDE 20 MG PO TABS
40.0000 mg | ORAL_TABLET | Freq: Every day | ORAL | Status: DC
  Administered 2017-02-28: 40 mg via ORAL
  Filled 2017-02-28: qty 2

## 2017-02-28 MED ORDER — FAMOTIDINE 20 MG PO TABS
20.0000 mg | ORAL_TABLET | Freq: Two times a day (BID) | ORAL | Status: DC
  Administered 2017-02-28: 20 mg via ORAL
  Filled 2017-02-28: qty 1

## 2017-02-28 MED ORDER — INSULIN ASPART 100 UNIT/ML ~~LOC~~ SOLN
0.0000 [IU] | SUBCUTANEOUS | Status: DC
  Administered 2017-02-28: 20 [IU] via SUBCUTANEOUS
  Administered 2017-02-28: 15 [IU] via SUBCUTANEOUS
  Filled 2017-02-28: qty 1

## 2017-02-28 MED ORDER — FLUOXETINE HCL 20 MG PO CAPS
40.0000 mg | ORAL_CAPSULE | Freq: Every day | ORAL | Status: DC
  Administered 2017-02-28: 40 mg via ORAL
  Filled 2017-02-28: qty 2

## 2017-02-28 MED ORDER — INSULIN ASPART 100 UNIT/ML ~~LOC~~ SOLN: 0.0000 [IU] | Freq: Three times a day (TID) | SUBCUTANEOUS | Status: DC

## 2017-02-28 MED ORDER — BISACODYL 5 MG PO TBEC: 5.0000 mg | DELAYED_RELEASE_TABLET | Freq: Every day | ORAL | Status: DC | PRN

## 2017-02-28 MED ORDER — ENOXAPARIN SODIUM 40 MG/0.4ML ~~LOC~~ SOLN
40.0000 mg | Freq: Every day | SUBCUTANEOUS | Status: DC
  Administered 2017-02-28: 40 mg via SUBCUTANEOUS
  Filled 2017-02-28: qty 0.4

## 2017-02-28 MED ORDER — BUSPIRONE HCL 10 MG PO TABS
15.0000 mg | ORAL_TABLET | Freq: Two times a day (BID) | ORAL | Status: DC
  Administered 2017-02-28: 15 mg via ORAL
  Filled 2017-02-28: qty 2

## 2017-02-28 MED ORDER — ROPINIROLE HCL 0.5 MG PO TABS: 0.5000 mg | ORAL_TABLET | Freq: Every day | ORAL | Status: DC

## 2017-02-28 MED ORDER — QUETIAPINE FUMARATE ER 400 MG PO TB24: 800.0000 mg | ORAL_TABLET | Freq: Every day | ORAL | Status: DC

## 2017-02-28 MED ORDER — ONDANSETRON HCL 4 MG/2ML IJ SOLN: 4.0000 mg | Freq: Four times a day (QID) | INTRAMUSCULAR | Status: DC | PRN

## 2017-02-28 MED ORDER — ASPIRIN EC 81 MG PO TBEC
81.0000 mg | DELAYED_RELEASE_TABLET | Freq: Every morning | ORAL | Status: DC
  Administered 2017-02-28: 81 mg via ORAL
  Filled 2017-02-28: qty 1

## 2017-02-28 MED ORDER — IOPAMIDOL (ISOVUE-370) INJECTION 76%
INTRAVENOUS | Status: AC
  Administered 2017-02-28: 50 mL
  Filled 2017-02-28: qty 50

## 2017-02-28 MED ORDER — INSULIN DETEMIR 100 UNIT/ML ~~LOC~~ SOLN
50.0000 [IU] | Freq: Two times a day (BID) | SUBCUTANEOUS | Status: DC
  Filled 2017-02-28: qty 0.5

## 2017-02-28 MED ORDER — ACETAMINOPHEN 325 MG PO TABS
650.0000 mg | ORAL_TABLET | Freq: Four times a day (QID) | ORAL | Status: DC | PRN
Start: 2017-02-28 — End: 2017-02-28

## 2017-02-28 MED ORDER — SODIUM CHLORIDE 0.9 % IV SOLN: 250.0000 mL | INTRAVENOUS | Status: DC | PRN

## 2017-02-28 MED ORDER — LOSARTAN POTASSIUM 25 MG PO TABS
25.0000 mg | ORAL_TABLET | Freq: Every day | ORAL | Status: DC
  Administered 2017-02-28: 25 mg via ORAL
  Filled 2017-02-28: qty 1

## 2017-02-28 MED ORDER — MOMETASONE FURO-FORMOTEROL FUM 100-5 MCG/ACT IN AERO
2.0000 | INHALATION_SPRAY | Freq: Two times a day (BID) | RESPIRATORY_TRACT | Status: DC
  Administered 2017-02-28: 2 via RESPIRATORY_TRACT
  Filled 2017-02-28: qty 8.8

## 2017-02-28 MED ORDER — TRAMADOL HCL 50 MG PO TABS: 50.0000 mg | ORAL_TABLET | Freq: Four times a day (QID) | ORAL | Status: DC | PRN

## 2017-02-28 MED ORDER — ACETAMINOPHEN 650 MG RE SUPP: 650.0000 mg | Freq: Four times a day (QID) | RECTAL | Status: DC | PRN

## 2017-02-28 MED ORDER — SODIUM CHLORIDE 0.9% FLUSH: 3.0000 mL | Freq: Two times a day (BID) | INTRAVENOUS | Status: DC

## 2017-02-28 MED ORDER — SODIUM CHLORIDE 0.9% FLUSH
3.0000 mL | Freq: Two times a day (BID) | INTRAVENOUS | Status: DC
  Administered 2017-02-28: 3 mL via INTRAVENOUS

## 2017-02-28 MED ORDER — ONDANSETRON HCL 4 MG PO TABS: 4.0000 mg | ORAL_TABLET | Freq: Four times a day (QID) | ORAL | Status: DC | PRN

## 2017-02-28 MED ORDER — ALBUTEROL SULFATE (2.5 MG/3ML) 0.083% IN NEBU: 2.5000 mg | INHALATION_SOLUTION | Freq: Four times a day (QID) | RESPIRATORY_TRACT | Status: DC | PRN

## 2017-02-28 NOTE — Progress Notes (Signed)
Pt was on RA sats 89%. RT placed pt on 4 LPM [place] per home regimen. No distress noted at this time.
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
## 2017-02-28 NOTE — Evaluation (Signed)
Physical Therapy Evaluation Patient Details Name: [name] MRN: [id number] DOB: March 25, 1971 Today's Date: 02/28/2017   History of Present Illness  46 yo female with onset of vision loss per her report on 8/17, no source of the issue located.  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  PMHx:  seizures, atherosclerosis, HTN, DM, CHF, CAD, R BBB, L hemi from stroke changes  Clinical Impression  Pt was noted to have replied to PT that PT looked familiar to her, met PT's eyes with conversation.  Additionally she navigated on the walker with almost no cuing, but did validate her need for O2 via nasal cannula.  Pt will be seen for assessment of stairs then can work on endurance with O2 to increase her range of mobility at home.  HHPT follow up as needed for mobility since pt reports a sudden vision change and has been requiring gait assistance prior to this.    Follow Up Recommendations Home health PT;Supervision for mobility/OOB    Equipment Recommendations  None recommended by PT    Recommendations for Other Services       Precautions / Restrictions Precautions Precautions: Fall (telemetry) Restrictions Weight Bearing Restrictions: No Other Position/Activity Restrictions: assist with vision loss to mobilize      Mobility  Bed Mobility Overal bed mobility: Modified Independent                Transfers Overall transfer level: Modified independent Equipment used: Rolling walker (2 wheeled)             General transfer comment: used good hand placement   Ambulation/Gait Ambulation/Gait assistance: Min guard Ambulation Distance (Feet): 150 Feet (30 x 5, short  trip to BR 30) Assistive device: Rolling walker (2 wheeled);1 person hand held assist (min guard with cues verbally as needed) Gait Pattern/deviations: Step-to pattern;Step-through pattern;Decreased stride length;Narrow base of support Gait velocity: reduced Gait velocity interpretation: Below normal  speed for age/gender General Gait Details: pt walked with avoidance of many obstacles, started turns as she reached the bed and door in her room without PT cuing every time.  Had O2 off for part of the time and declined to 86% after sitting, with O2 was at 99% O2 sat.  Stairs            Wheelchair Mobility    Modified Rankin (Stroke Patients Only)       Balance Overall balance assessment: Needs assistance Sitting-balance support: Feet supported Sitting balance-Leahy Scale: Good     Standing balance support: Bilateral upper extremity supported Standing balance-Leahy Scale: Fair                               Pertinent Vitals/Pain Pain Assessment: No/denies pain    Home Living Family/patient expects to be discharged to:: Private residence Living Arrangements: Spouse/significant other;Children (family available 24/7) Available Help at Discharge: Family;Available 24 hours/day Type of Home: House Home Access: Stairs to enter   [employer] of Steps: 2 Home Layout: One level Home Equipment: Walker - 2 wheels;Walker - 4 wheels Additional Comments: has been mainly having a family member hold her R shoulder and assist her that way at home    Prior Function Level of Independence: Needs assistance   Gait / Transfers Assistance Needed: min guard assist for gait and I with transfers  ADL's / Homemaking Assistance Needed: husband and son help but pt does some housework  Comments: pt feels  her rollator is not that helpful     Hand Dominance   Dominant Hand: Right    Extremity/Trunk Assessment   Upper Extremity Assessment Upper Extremity Assessment: Overall WFL for tasks assessed    Lower Extremity Assessment Lower Extremity Assessment: Generalized weakness (mild weakness)    Cervical / Trunk Assessment Cervical / Trunk Assessment: Normal  Communication   Communication: No difficulties  Cognition Arousal/Alertness: Awake/alert Behavior  During Therapy: WFL for tasks assessed/performed Overall Cognitive Status: Within Functional Limits for tasks assessed                                        General Comments General comments (skin integrity, edema, etc.): Pt needs O2 via cannula to maintain activity based drops in O2 sats    Exercises     Assessment/Plan    PT Assessment Patient needs continued PT services  PT Problem List Decreased range of motion;Decreased activity tolerance;Decreased balance;Decreased coordination;Decreased mobility;Decreased knowledge of use of DME;Decreased safety awareness;Cardiopulmonary status limiting activity;Obesity       PT Treatment Interventions DME instruction;Gait training;Stair training;Functional mobility training;Therapeutic activities;Therapeutic exercise;Balance training;Neuromuscular re-education;Patient/family education    PT Goals (Current goals can be found in the Care Plan section)  Acute Rehab PT Goals Patient Stated Goal: to walk and feel better PT Goal Formulation: With patient Time For Goal Achievement: 03/07/17 Potential to Achieve Goals: Good    Frequency Min 3X/week   Barriers to discharge Inaccessible home environment stairs to enter house    Co-evaluation               AM-PAC PT "6 Clicks" Daily Activity  Outcome Measure Difficulty turning over in bed (including adjusting bedclothes, sheets and blankets)?: A Little Difficulty moving from lying on back to sitting on the side of the bed? : A Little Difficulty sitting down on and standing up from a chair with arms (e.g., wheelchair, bedside commode, etc,.)?: A Little Help needed moving to and from a bed to chair (including a wheelchair)?: A Little Help needed walking in hospital room?: A Little Help needed climbing 3-5 steps with a railing? : A Little 6 Click Score: 18    End of Session Equipment Utilized During Treatment: Gait belt;Oxygen Activity Tolerance: Patient tolerated  treatment well;Treatment limited secondary to medical complications (Comment) (did not walk without O2 after initial check) Patient left: in bed;with call bell/phone within reach;Other (comment) (staff for radiology arrived to take for imaging) Nurse Communication: Mobility status;Other (comment) (vision complaints vs presentation) PT Visit Diagnosis: Unsteadiness on feet (R26.81);Other abnormalities of gait and mobility (R26.89)    Time: 1224-4975 PT Time Calculation (min) (ACUTE ONLY): 28 min   Charges:   PT Evaluation $PT Eval Low Complexity: 1 Low PT Treatments $Gait Training: 8-22 mins   PT G Codes:   PT G-Codes **NOT FOR INPATIENT CLASS** Functional Assessment Tool Used: AM-PAC 6 Clicks Basic Mobility Functional Limitation: Mobility: Walking and moving around Mobility: Walking and Moving Around Current Status (P0051): At least 20 percent but less than 40 percent impaired, limited or restricted Mobility: Walking and Moving Around Goal Status [phone]): At least 1 percent but less than 20 percent impaired, limited or restricted    Ramond Dial 02/28/2017, 10:39 AM   Mee Hives, PT MS Acute Rehab Dept. Number: [place]

## 2017-02-28 NOTE — Progress Notes (Signed)
Pt asking about discharge and wanting to leave. Made aware she would have to leave against medical advice if she left because as of that time did not have any discharge orders. Charge RN paged Dr. Eliseo Squires to update her that pt was ready to leave. This RN spoke with Dr. Eliseo Squires who stated that she was waiting on the diabetes coordinator to see pt prior to dc. RN went to pt room to update on plan of care and When RN went to room the pt was not there. The diabetes coordinator was sitting outside the room waiting to see pt as well. The pt [name] was on the bed, telemetry on the computer, and all belongings were gone. Pt had been asking "when am I leaving? I want this IV out! I am ready to go home." prior to her being seen walking in hallway.  Staff said they saw pt in the hallway walking with her significant other but thought she was just ambulating. Pt is nowhere on the unit.  Pt left the hospital against medical advice. Pt did not sign AMA papers. Dr. Eliseo Squires has been made aware.

## 2017-02-28 NOTE — ED Notes (Signed)
ED Provider at bedside. 

## 2017-02-28 NOTE — Evaluation (Signed)
Occupational Therapy Evaluation Patient Details Name: [name] MRN: [id number] DOB: [birth year] Today's Date: 02/28/2017    History of Present Illness 46 yo female with onset of vision loss per her report on 8/17, no source of the issue located.  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  PMHx:  seizures, atherosclerosis, HTN, DM, CHF, CAD, R BBB, L hemi from stroke changes   Clinical Impression   This 46 y/o F presents with the above. At baseline Pt is independent with functional mobility, receives intermittent assist from her spouse for LB ADL completion. Pt currently requires MinA for functional mobility utilizing RW and HHA, [place] for LB ADLs. Pt reporting vision impairments, though presents with inconsistencies when assessing vision during functional task completion. Will continue to follow Pt while in acute setting to progress Pt's safety and independence with ADLs and functional mobility prior to return home, recommend 24 hr supervision initially after return home for increased safety during mobility and ADL task completion.     Follow Up Recommendations  Supervision/Assistance - 24 hour;No OT follow up    Equipment Recommendations  None recommended by OT           Precautions / Restrictions Precautions Precautions: Fall Restrictions Weight Bearing Restrictions: No Other Position/Activity Restrictions: assist during mobility due to decreased vision      Mobility Bed Mobility Overal bed mobility: Modified Independent                Transfers Overall transfer level: Needs assistance Equipment used: Rolling walker (2 wheeled);1 person hand held assist Transfers: Sit to/from Stand Sit to Stand: Min guard         General transfer comment: verbal cues for hand placement; Pt initially completes mobility with RW to sink and bathroom doorway with MinA, when returning from bathroom utilized HHA for steady assist     Balance Overall balance assessment:  Needs assistance Sitting-balance support: Feet supported Sitting balance-Leahy Scale: Good     Standing balance support: Bilateral upper extremity supported;Single extremity supported Standing balance-Leahy Scale: Fair                             ADL either performed or assessed with clinical judgement   ADL Overall ADL's : Needs assistance/impaired Eating/Feeding: Set up;Sitting   Grooming: Wash/dry hands;Oral care;Min guard;Standing Grooming Details (indicate cue type and reason): Pt is able to locate toothbrush, toothpaste, and apply toothpaste without difficulty (needs assist to open toothbrush); increased time to locate paper towels on wall  Upper Body Bathing: Min guard;Sitting   Lower Body Bathing: Minimal assistance;Sit to/from stand Lower Body Bathing Details (indicate cue type and reason): Pt demonstrates propping leg on bed to reach feet for LB bathing, reports this is how she completes LB bathing using her tub bench at home  Upper Body Dressing : Min guard;Sitting   Lower Body Dressing: Moderate assistance;Sit to/from stand   Toilet Transfer: Minimal assistance;Ambulation;Comfort height toilet;Grab bars   Toileting- Clothing Manipulation and Hygiene: Sit to/from stand;Minimal assistance       Functional mobility during ADLs: Minimal assistance;Rolling walker General ADL Comments: Pt on 4L O2, reports she only wears O2 at home at night and when laying down; O2 monitored during session, remaining 88-93% on RA while brushing teeth, increases to above 90% with verbal cues for deep breathing, applied O2 after returning to sitting EOB to maintain O2 levels over 90%; Pt with inconsistencies in presentation vs  report regarding vision loss/vision changes      Vision Baseline Vision/History: Wears glasses Wears Glasses: Distance only Patient Visual Report: Blurring of vision;Other (comment) (Pt reports she only sees white) Vision Assessment?:  (will continue to  assess ) Eye Alignment: Within Functional Limits Additional Comments: Inconsistencies with vision presentation; Pt demonstrates slow cautious movements during mobility within room, reaching out to feel for wall/counter to avoid obstacles; when brushing teeth Pt is able to locate toothbrush and toothpaste laying on sink without difficulty, demonstrates difficulty locating paper towel holder; Pt makes eye contact with therapist; able to point directly to where RW is located across from foot of bed; Pt returned to EOB x2 times during session, first time without difficulty, second attempt therapist had to use verbal cues for Pt to locate EOB; formal vision assessment completed end of session; Pt tracks pen to L, begins to track pen to R and then stops, stares straight ahead reporting she cannot see pen and cannot see therapists face                Pertinent Vitals/Pain Pain Assessment: No/denies pain     Hand Dominance Right   Extremity/Trunk Assessment Upper Extremity Assessment Upper Extremity Assessment: Overall WFL for tasks assessed   Lower Extremity Assessment Lower Extremity Assessment: Defer to PT evaluation   Cervical / Trunk Assessment Cervical / Trunk Assessment: Normal   Communication Communication Communication: No difficulties   Cognition Arousal/Alertness: Awake/alert Behavior During Therapy: WFL for tasks assessed/performed Overall Cognitive Status: Within Functional Limits for tasks assessed                                                      Home Living Family/patient expects to be discharged to:: Private residence Living Arrangements: Spouse/significant other;Children Available Help at Discharge: Family;Available 24 hours/day Type of Home: House Home Access: Stairs to enter [employer] of Steps: 2   Home Layout: One level     Bathroom Shower/Tub: (P) Tub/shower unit   Bathroom Toilet: Standard     Home Equipment: Environmental consultant  - 2 wheels;Walker - 4 wheels;Tub bench   Additional Comments: has been mainly having a family member hold her R shoulder and assist her that way at home      Prior Functioning/Environment Level of Independence: Needs assistance  Gait / Transfers Assistance Needed: min guard assist for gait and I with transfers ADL's / Homemaking Assistance Needed: husband and son help but pt does some housework; Pt reports her husband assists with LB dressing PRN   Comments: pt feels her rollator is not that helpful        OT Problem List: Decreased activity tolerance;Impaired balance (sitting and/or standing);Impaired vision/perception      OT Treatment/Interventions: Self-care/ADL training;DME and/or AE instruction;Therapeutic activities;Balance training;Therapeutic exercise;Visual/perceptual remediation/compensation;Patient/family education    OT Goals(Current goals can be found in the care plan section) Acute Rehab OT Goals Patient Stated Goal: to walk and feel better OT Goal Formulation: With patient Time For Goal Achievement: 03/14/17 Potential to Achieve Goals: Good  OT Frequency: Min 2X/week                             AM-PAC PT "6 Clicks" Daily Activity     Outcome Measure Help from another person eating meals?: None Help  from another person taking care of personal grooming?: A Little Help from another person toileting, which includes using toliet, bedpan, or urinal?: A Little Help from another person bathing (including washing, rinsing, drying)?: A Little Help from another person to put on and taking off regular upper body clothing?: None Help from another person to put on and taking off regular lower body clothing?: A Little 6 Click Score: 20   End of Session Equipment Utilized During Treatment: Gait belt;Rolling walker Nurse Communication: Mobility status  Activity Tolerance: Patient tolerated treatment well Patient left: in bed;with call bell/phone within reach;with  bed alarm set;with family/visitor present  OT Visit Diagnosis: Unsteadiness on feet (R26.81);Low vision, both eyes (H54.2)                Time: 1046-1110 OT Time Calculation (min): 24 min Charges:  OT General Charges $OT Visit: 1 Procedure OT Evaluation $OT Eval Low Complexity: 1 Procedure G-Codes: OT G-codes **NOT FOR INPATIENT CLASS** Functional Assessment Tool Used: AM-PAC 6 Clicks Daily Activity;Clinical judgement Functional Limitation: Self care Self Care Current Status (P5916): At least 20 percent but less than 40 percent impaired, limited or restricted Self Care Goal Status (B8466): At least 1 percent but less than 20 percent impaired, limited or restricted   Lou Cal, OT Pager [phone] 02/28/2017   Raymondo Band 02/28/2017, 2:30 PM

## 2017-02-28 NOTE — ED Notes (Signed)
Pt continues to report inability to see anything. During assessment, when asked visual questions (such as tracking a finger, etc), pt states she's unable to do that because she can't see. Throughout other aspects of assessment and general interaction, pt is observed to be making direct eye contact with this RN and tracking my movements, actions with her eyes, head, and responding accordingly. For example, while preparing to check her blood sugar, pt turned her head, looked down at my work area, held out her hand, and noted "Finger stick time, eh?" While performing stroke swallow screen, I would extend the cup of water to her and she reached out to take it from me, precisely and without prompting. I did this from both her left and her right sides.

## 2017-02-28 NOTE — Care Management Note (Signed)
Case Management Note  Patient Details  Name: Akhila Mahnken MRN: [id number] Date of Birth: 04/17/1971  Subjective/Objective:    Vision loss                Action/Plan: Discharge Planning: Went to room to see pt and pt was not in room. Per Unit RN pt left against medical advice.    Expected Discharge Date:  02/28/2017               Expected Discharge Plan:  Against Medical Advice  In-House Referral:  NA  Discharge planning Services  CM Consult  Post Acute Care Choice:  NA Choice offered to:  NA  DME Arranged:  N/A DME Agency:  NA  HH Arranged:  NA HH Agency:  NA  Status of Service:  Completed, signed off  If discussed at [place] of Stay Meetings, dates discussed:    Additional Comments:  Erenest Rasher, RN 02/28/2017, 3:49 PM

## 2017-02-28 NOTE — Progress Notes (Signed)
Pt. Appears to be able to see remote control and able to turn switch on reading light. She also maintains eye contact when speaking. She also spoke to her boyfriend/ husband before he introduced himself when entered her room. Which she would be unable to do if she was blind. But denies being able to see anything.
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
## 2017-02-28 NOTE — ED Notes (Signed)
While RN starting at room, looked at me in the eye. I smiled at her and she smiled back and pt states, "Are you having a hard time?". Pt also requested her brother put her socks on. When her brother picked up her sock, she states, "No, the other sock."
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
## 2017-02-28 NOTE — ED Notes (Signed)
Pt informed RN that she normally wears 4L O2 [place] at home when she sleeps. Pt's O2 sats 88-91%. RN placed pt on 4L [place]
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
## 2017-03-01 LAB — POCT I-STAT TROPONIN I: Troponin i, poc: 0 ng/mL (ref 0.00–0.08)

## 2017-03-07 DIAGNOSIS — H3582 Retinal ischemia: Secondary | ICD-10-CM | POA: Diagnosis not present

## 2017-03-07 DIAGNOSIS — H43811 Vitreous degeneration, right eye: Secondary | ICD-10-CM | POA: Diagnosis not present

## 2017-03-07 DIAGNOSIS — E113513 Type 2 diabetes mellitus with proliferative diabetic retinopathy with macular edema, bilateral: Secondary | ICD-10-CM | POA: Diagnosis not present

## 2017-03-07 DIAGNOSIS — H4313 Vitreous hemorrhage, bilateral: Secondary | ICD-10-CM | POA: Diagnosis not present

## 2017-03-08 DIAGNOSIS — E119 Type 2 diabetes mellitus without complications: Secondary | ICD-10-CM | POA: Diagnosis not present

## 2017-03-09 ENCOUNTER — Ambulatory Visit: Payer: Medicare Other | Admitting: Acute Care

## 2017-03-14 ENCOUNTER — Other Ambulatory Visit: Payer: Self-pay | Admitting: Cardiology

## 2017-03-14 ENCOUNTER — Inpatient Hospital Stay (HOSPITAL_COMMUNITY): Payer: Medicare Other

## 2017-03-14 ENCOUNTER — Ambulatory Visit (INDEPENDENT_AMBULATORY_CARE_PROVIDER_SITE_OTHER): Payer: BLUE CROSS/BLUE SHIELD | Admitting: Primary Care

## 2017-03-14 ENCOUNTER — Inpatient Hospital Stay (HOSPITAL_COMMUNITY)
Admission: AD | Admit: 2017-03-14 | Discharge: 2017-03-19 | DRG: 291 | Disposition: A | Discharge status: Home / Self Care | Payer: Medicare Other | Source: Ambulatory Visit | Attending: Cardiology | Admitting: Cardiology

## 2017-03-14 ENCOUNTER — Encounter: Payer: Self-pay | Admitting: Primary Care

## 2017-03-14 VITALS — BP 120/70 | HR 102 | Temp 98.2°F | Ht 66.0 in | Wt 302.1 lb

## 2017-03-14 DIAGNOSIS — I358 Other nonrheumatic aortic valve disorders: Secondary | ICD-10-CM | POA: Diagnosis present

## 2017-03-14 DIAGNOSIS — I5033 Acute on chronic diastolic (congestive) heart failure: Secondary | ICD-10-CM

## 2017-03-14 DIAGNOSIS — J432 Centrilobular emphysema: Secondary | ICD-10-CM | POA: Diagnosis present

## 2017-03-14 DIAGNOSIS — I5030 Unspecified diastolic (congestive) heart failure: Secondary | ICD-10-CM

## 2017-03-14 DIAGNOSIS — E781 Pure hyperglyceridemia: Secondary | ICD-10-CM | POA: Diagnosis present

## 2017-03-14 DIAGNOSIS — Z794 Long term (current) use of insulin: Secondary | ICD-10-CM | POA: Diagnosis not present

## 2017-03-14 DIAGNOSIS — E871 Hypo-osmolality and hyponatremia: Secondary | ICD-10-CM | POA: Diagnosis not present

## 2017-03-14 DIAGNOSIS — R0602 Shortness of breath: Secondary | ICD-10-CM | POA: Diagnosis not present

## 2017-03-14 DIAGNOSIS — I4510 Unspecified right bundle-branch block: Secondary | ICD-10-CM | POA: Diagnosis present

## 2017-03-14 DIAGNOSIS — F319 Bipolar disorder, unspecified: Secondary | ICD-10-CM | POA: Diagnosis present

## 2017-03-14 DIAGNOSIS — R4182 Altered mental status, unspecified: Secondary | ICD-10-CM | POA: Diagnosis not present

## 2017-03-14 DIAGNOSIS — R188 Other ascites: Secondary | ICD-10-CM

## 2017-03-14 DIAGNOSIS — T502X5A Adverse effect of carbonic-anhydrase inhibitors, benzothiadiazides and other diuretics, initial encounter: Secondary | ICD-10-CM | POA: Diagnosis not present

## 2017-03-14 DIAGNOSIS — E1122 Type 2 diabetes mellitus with diabetic chronic kidney disease: Secondary | ICD-10-CM | POA: Diagnosis present

## 2017-03-14 DIAGNOSIS — I5032 Chronic diastolic (congestive) heart failure: Secondary | ICD-10-CM | POA: Diagnosis present

## 2017-03-14 DIAGNOSIS — E1142 Type 2 diabetes mellitus with diabetic polyneuropathy: Secondary | ICD-10-CM | POA: Diagnosis not present

## 2017-03-14 DIAGNOSIS — Z8673 Personal history of transient ischemic attack (TIA), and cerebral infarction without residual deficits: Secondary | ICD-10-CM | POA: Diagnosis not present

## 2017-03-14 DIAGNOSIS — Z6841 Body Mass Index (BMI) 40.0 and over, adult: Secondary | ICD-10-CM | POA: Diagnosis not present

## 2017-03-14 DIAGNOSIS — N183 Chronic kidney disease, stage 3 (moderate): Secondary | ICD-10-CM | POA: Diagnosis not present

## 2017-03-14 DIAGNOSIS — Z888 Allergy status to other drugs, medicaments and biological substances status: Secondary | ICD-10-CM

## 2017-03-14 DIAGNOSIS — IMO0002 Reserved for concepts with insufficient information to code with codable children: Secondary | ICD-10-CM

## 2017-03-14 DIAGNOSIS — I2510 Atherosclerotic heart disease of native coronary artery without angina pectoris: Secondary | ICD-10-CM

## 2017-03-14 DIAGNOSIS — F419 Anxiety disorder, unspecified: Secondary | ICD-10-CM | POA: Diagnosis present

## 2017-03-14 DIAGNOSIS — E1165 Type 2 diabetes mellitus with hyperglycemia: Secondary | ICD-10-CM

## 2017-03-14 DIAGNOSIS — R400 Somnolence: Secondary | ICD-10-CM | POA: Diagnosis not present

## 2017-03-14 DIAGNOSIS — K219 Gastro-esophageal reflux disease without esophagitis: Secondary | ICD-10-CM | POA: Diagnosis present

## 2017-03-14 DIAGNOSIS — Z79899 Other long term (current) drug therapy: Secondary | ICD-10-CM

## 2017-03-14 DIAGNOSIS — I10 Essential (primary) hypertension: Secondary | ICD-10-CM

## 2017-03-14 DIAGNOSIS — Z885 Allergy status to narcotic agent status: Secondary | ICD-10-CM

## 2017-03-14 DIAGNOSIS — J449 Chronic obstructive pulmonary disease, unspecified: Secondary | ICD-10-CM | POA: Diagnosis not present

## 2017-03-14 DIAGNOSIS — Z9981 Dependence on supplemental oxygen: Secondary | ICD-10-CM

## 2017-03-14 DIAGNOSIS — Z880 Allergy status to penicillin: Secondary | ICD-10-CM

## 2017-03-14 DIAGNOSIS — Z86718 Personal history of other venous thrombosis and embolism: Secondary | ICD-10-CM

## 2017-03-14 DIAGNOSIS — I959 Hypotension, unspecified: Secondary | ICD-10-CM | POA: Diagnosis not present

## 2017-03-14 DIAGNOSIS — Z7982 Long term (current) use of aspirin: Secondary | ICD-10-CM

## 2017-03-14 DIAGNOSIS — I130 Hypertensive heart and chronic kidney disease with heart failure and stage 1 through stage 4 chronic kidney disease, or unspecified chronic kidney disease: Secondary | ICD-10-CM | POA: Diagnosis present

## 2017-03-14 DIAGNOSIS — R109 Unspecified abdominal pain: Secondary | ICD-10-CM | POA: Diagnosis not present

## 2017-03-14 DIAGNOSIS — N179 Acute kidney failure, unspecified: Secondary | ICD-10-CM | POA: Diagnosis not present

## 2017-03-14 DIAGNOSIS — G92 Toxic encephalopathy: Secondary | ICD-10-CM | POA: Diagnosis not present

## 2017-03-14 DIAGNOSIS — J9690 Respiratory failure, unspecified, unspecified whether with hypoxia or hypercapnia: Secondary | ICD-10-CM | POA: Diagnosis not present

## 2017-03-14 DIAGNOSIS — F17210 Nicotine dependence, cigarettes, uncomplicated: Secondary | ICD-10-CM | POA: Diagnosis present

## 2017-03-14 DIAGNOSIS — E118 Type 2 diabetes mellitus with unspecified complications: Secondary | ICD-10-CM

## 2017-03-14 DIAGNOSIS — R29818 Other symptoms and signs involving the nervous system: Secondary | ICD-10-CM | POA: Diagnosis not present

## 2017-03-14 DIAGNOSIS — I6523 Occlusion and stenosis of bilateral carotid arteries: Secondary | ICD-10-CM | POA: Diagnosis present

## 2017-03-14 HISTORY — DX: Dyspnea, unspecified: R06.00

## 2017-03-14 LAB — BRAIN NATRIURETIC PEPTIDE: B Natriuretic Peptide: 103.4 pg/mL — ABNORMAL HIGH (ref 0.0–100.0)

## 2017-03-14 LAB — BASIC METABOLIC PANEL
Anion gap: 9 (ref 5–15)
BUN: 36 mg/dL — ABNORMAL HIGH (ref 6–20)
CO2: 30 mmol/L (ref 22–32)
Calcium: 10.1 mg/dL (ref 8.9–10.3)
Chloride: 99 mmol/L — ABNORMAL LOW (ref 101–111)
Creatinine, Ser: 1.31 mg/dL — ABNORMAL HIGH (ref 0.44–1.00)
GFR calc Af Amer: 55 mL/min — ABNORMAL LOW (ref >60)
GFR calc non Af Amer: 47 mL/min — ABNORMAL LOW (ref >60)
Glucose, Bld: 142 mg/dL — ABNORMAL HIGH (ref 65–99)
Potassium: 4.6 mmol/L (ref 3.5–5.1)
Sodium: 138 mmol/L (ref 135–145)

## 2017-03-14 LAB — CBC
HCT: 36.3 % (ref 36.0–46.0)
Hemoglobin: 11.6 g/dL — ABNORMAL LOW (ref 12.0–15.0)
MCH: 27.1 pg (ref 26.0–34.0)
MCHC: 32 g/dL (ref 30.0–36.0)
MCV: 84.8 fL (ref 78.0–100.0)
Platelets: 163 10*3/uL (ref 150–400)
RBC: 4.28 MIL/uL (ref 3.87–5.11)
RDW: 20.7 % — ABNORMAL HIGH (ref 11.5–15.5)
WBC: 13.3 10*3/uL — ABNORMAL HIGH (ref 4.0–10.5)

## 2017-03-14 LAB — HEMOGLOBIN A1C
Hgb A1c MFr Bld: 12.1 % — ABNORMAL HIGH (ref 4.8–5.6)
Mean Plasma Glucose: 300.57 mg/dL

## 2017-03-14 LAB — GLUCOSE, CAPILLARY
Glucose-Capillary: 138 mg/dL — ABNORMAL HIGH (ref 65–99)
Glucose-Capillary: 205 mg/dL — ABNORMAL HIGH (ref 65–99)

## 2017-03-14 MED ORDER — VITAMIN D (ERGOCALCIFEROL) 1.25 MG (50000 UNIT) PO CAPS
50000.0000 [IU] | ORAL_CAPSULE | ORAL | Status: DC
  Administered 2017-03-15: 50000 [IU] via ORAL
  Filled 2017-03-14: qty 1

## 2017-03-14 MED ORDER — FUROSEMIDE 10 MG/ML IJ SOLN
40.0000 mg | Freq: Four times a day (QID) | INTRAMUSCULAR | Status: DC
  Administered 2017-03-15 – 2017-03-16 (×6): 40 mg via INTRAVENOUS
  Filled 2017-03-14 (×6): qty 4

## 2017-03-14 MED ORDER — METFORMIN HCL 500 MG PO TABS
1000.0000 mg | ORAL_TABLET | Freq: Two times a day (BID) | ORAL | Status: DC
  Administered 2017-03-15 – 2017-03-17 (×5): 1000 mg via ORAL
  Filled 2017-03-14 (×5): qty 2

## 2017-03-14 MED ORDER — SODIUM CHLORIDE 0.9% FLUSH: 3.0000 mL | INTRAVENOUS | Status: DC | PRN

## 2017-03-14 MED ORDER — SODIUM CHLORIDE 0.9% FLUSH
3.0000 mL | Freq: Two times a day (BID) | INTRAVENOUS | Status: DC
  Administered 2017-03-14 – 2017-03-19 (×10): 3 mL via INTRAVENOUS

## 2017-03-14 MED ORDER — SODIUM CHLORIDE 0.9 % IV SOLN: 250.0000 mL | INTRAVENOUS | Status: DC | PRN

## 2017-03-14 MED ORDER — ASPIRIN EC 81 MG PO TBEC
81.0000 mg | DELAYED_RELEASE_TABLET | Freq: Every morning | ORAL | Status: DC
  Administered 2017-03-15 – 2017-03-19 (×5): 81 mg via ORAL
  Filled 2017-03-14 (×5): qty 1

## 2017-03-14 MED ORDER — SPIRONOLACTONE 25 MG PO TABS
25.0000 mg | ORAL_TABLET | Freq: Every day | ORAL | Status: DC
  Administered 2017-03-15 – 2017-03-17 (×3): 25 mg via ORAL
  Filled 2017-03-14 (×3): qty 1

## 2017-03-14 MED ORDER — INSULIN REGULAR HUMAN (CONC) 500 UNIT/ML ~~LOC~~ SOLN: 65.0000 [IU] | Freq: Three times a day (TID) | SUBCUTANEOUS | Status: DC

## 2017-03-14 MED ORDER — ALBUTEROL SULFATE HFA 108 (90 BASE) MCG/ACT IN AERS: 2.0000 | INHALATION_SPRAY | Freq: Four times a day (QID) | RESPIRATORY_TRACT | Status: DC | PRN

## 2017-03-14 MED ORDER — HEPARIN SODIUM (PORCINE) 5000 UNIT/ML IJ SOLN
5000.0000 [IU] | Freq: Three times a day (TID) | INTRAMUSCULAR | Status: DC
  Administered 2017-03-14 – 2017-03-19 (×13): 5000 [IU] via SUBCUTANEOUS
  Filled 2017-03-14 (×13): qty 1

## 2017-03-14 MED ORDER — LOSARTAN POTASSIUM 25 MG PO TABS
25.0000 mg | ORAL_TABLET | Freq: Every day | ORAL | Status: DC
  Administered 2017-03-15 – 2017-03-17 (×3): 25 mg via ORAL
  Filled 2017-03-14 (×3): qty 1

## 2017-03-14 MED ORDER — BISOPROLOL FUMARATE 5 MG PO TABS
5.0000 mg | ORAL_TABLET | Freq: Every day | ORAL | Status: DC
  Administered 2017-03-15 – 2017-03-17 (×3): 5 mg via ORAL
  Filled 2017-03-14 (×3): qty 1

## 2017-03-14 MED ORDER — ONDANSETRON HCL 4 MG/2ML IJ SOLN
4.0000 mg | Freq: Four times a day (QID) | INTRAMUSCULAR | Status: DC | PRN
Start: 2017-03-14 — End: 2017-03-19

## 2017-03-14 MED ORDER — ALBUTEROL SULFATE HFA 108 (90 BASE) MCG/ACT IN AERS: 2.0000 | INHALATION_SPRAY | RESPIRATORY_TRACT | Status: DC | PRN

## 2017-03-14 MED ORDER — QUETIAPINE FUMARATE ER 400 MG PO TB24
800.0000 mg | ORAL_TABLET | Freq: Every day | ORAL | Status: DC
  Administered 2017-03-14 – 2017-03-16 (×3): 800 mg via ORAL
  Filled 2017-03-14 (×3): qty 2

## 2017-03-14 MED ORDER — ONDANSETRON HCL 4 MG PO TABS: 4.0000 mg | ORAL_TABLET | Freq: Four times a day (QID) | ORAL | Status: DC | PRN

## 2017-03-14 MED ORDER — ALPRAZOLAM 0.5 MG PO TABS
1.0000 mg | ORAL_TABLET | Freq: Every day | ORAL | Status: DC | PRN
  Administered 2017-03-15 – 2017-03-17 (×4): 1 mg via ORAL
  Filled 2017-03-14 (×4): qty 2

## 2017-03-14 MED ORDER — ATORVASTATIN CALCIUM 40 MG PO TABS
40.0000 mg | ORAL_TABLET | Freq: Every day | ORAL | Status: DC
  Administered 2017-03-14 – 2017-03-18 (×4): 40 mg via ORAL
  Filled 2017-03-14 (×5): qty 1

## 2017-03-14 MED ORDER — ROPINIROLE HCL 1 MG PO TABS
0.5000 mg | ORAL_TABLET | Freq: Every day | ORAL | Status: DC
  Administered 2017-03-14 – 2017-03-16 (×3): 0.5 mg via ORAL
  Filled 2017-03-14 (×3): qty 1

## 2017-03-14 MED ORDER — BUSPIRONE HCL 15 MG PO TABS
30.0000 mg | ORAL_TABLET | Freq: Two times a day (BID) | ORAL | Status: DC
  Administered 2017-03-14 – 2017-03-17 (×7): 30 mg via ORAL
  Filled 2017-03-14: qty 2
  Filled 2017-03-14 (×3): qty 3
  Filled 2017-03-14: qty 2
  Filled 2017-03-14 (×3): qty 3

## 2017-03-14 MED ORDER — ALBUTEROL SULFATE (2.5 MG/3ML) 0.083% IN NEBU: 2.5000 mg | INHALATION_SOLUTION | RESPIRATORY_TRACT | Status: DC | PRN

## 2017-03-14 MED ORDER — MOMETASONE FURO-FORMOTEROL FUM 100-5 MCG/ACT IN AERO
2.0000 | INHALATION_SPRAY | Freq: Two times a day (BID) | RESPIRATORY_TRACT | Status: DC
  Administered 2017-03-15 – 2017-03-19 (×8): 2 via RESPIRATORY_TRACT
  Filled 2017-03-14 (×2): qty 8.8

## 2017-03-14 MED ORDER — TRAMADOL HCL 50 MG PO TABS
50.0000 mg | ORAL_TABLET | Freq: Four times a day (QID) | ORAL | Status: DC | PRN
  Administered 2017-03-15 – 2017-03-17 (×4): 50 mg via ORAL
  Filled 2017-03-14 (×4): qty 1

## 2017-03-14 MED ORDER — INSULIN ASPART 100 UNIT/ML ~~LOC~~ SOLN
0.0000 [IU] | Freq: Three times a day (TID) | SUBCUTANEOUS | Status: DC
  Administered 2017-03-15: 15 [IU] via SUBCUTANEOUS

## 2017-03-14 MED ORDER — ACETAMINOPHEN 325 MG PO TABS: 650.0000 mg | ORAL_TABLET | ORAL | Status: DC | PRN

## 2017-03-14 MED ORDER — PREGABALIN 100 MG PO CAPS
300.0000 mg | ORAL_CAPSULE | Freq: Two times a day (BID) | ORAL | Status: DC
  Administered 2017-03-14 – 2017-03-17 (×6): 300 mg via ORAL
  Filled 2017-03-14 (×6): qty 3

## 2017-03-14 MED ORDER — FAMOTIDINE 20 MG PO TABS
20.0000 mg | ORAL_TABLET | Freq: Two times a day (BID) | ORAL | Status: DC
  Administered 2017-03-14 – 2017-03-18 (×8): 20 mg via ORAL
  Filled 2017-03-14 (×8): qty 1

## 2017-03-14 NOTE — Assessment & Plan Note (Signed)
Following with pulmonology. Using albuterol twice weekly on average, compliant to Symbicort twice daily. Discussed importance of tobacco cessation, she is not ready to quit.

## 2017-03-14 NOTE — Assessment & Plan Note (Signed)
Stable in the office today, continue losartan, spironolactone, torsemide.

## 2017-03-14 NOTE — H&P (Addendum)
[name] is an 46 y.o. female.   Chief Complaint: Dyspnea on exertion. HPI: Patient with history of chronic diastolic heart failure, hypertension, diabetes mellitus, ongoing tobacco use disorder, COPD is being seen in our office on an urgent basis due to rapid weight gain over the past 4-5 days.  Patient has noticed marked dyspnea even during minimal activities and also has been having orthopnea. She has noticed significant distention of her abdomen and also leg edema.  No hemoptysis, no painful swelling of the lower extremities.  Denies any chest pain or palpitations.  Husband is present at the bedside.  Past Medical History:  Diagnosis Date  . Anxiety    takes Prozac daily  . Anxiety   . Aortic valve calcification   . Asthma    Advair and Spirva daily  . Asthma   . Bipolar disorder ([place])   . CAD (coronary artery disease)    a. LHC 11/2013 done for CP/fluid retention: mild disease in prox LAD, mild-mod disease in mRCA, EF 60% with normal LVEDP. b. Normal nuc 03/2016.  Marland Kitchen CHF (congestive heart failure) ([place])   . Chronic diastolic CHF (congestive heart failure) ([place])   . CKD (chronic kidney disease), stage II   . COPD (chronic obstructive pulmonary disease) (HCC)    a. nocturnal O2.  Marland Kitchen COPD (chronic obstructive pulmonary disease) ([place])   . Coronary artery disease   . Diabetes mellitus   . Diabetes mellitus without complication ([place])   . Family history of adverse reaction to anesthesia    mom gets nauseated  . GERD (gastroesophageal reflux disease)    takes Pepcid daily  . History of blood clots    left leg 3-34yrs ago  . Hyperlipidemia   . Hypertension   . Hypertriglyceridemia   . Inguinal hernia, left 01/2015  . Muscle spasm   . Peripheral neuropathy   . RBBB   . Seizures ([place])   . Sinus tachycardia    a. persistent since 2009.  Marland Kitchen Smokers' cough ([place])   . Stroke [facility]) 1989   left sided weakness  . TIA (transient ischemic attack)   . Tobacco abuse     Past Surgical  History:  Procedure Laterality Date  . HERNIA REPAIR    . INGUINAL HERNIA REPAIR Left 04/08/2015   Procedure: OPEN LEFT INGUINAL HERNIA REPAIR WITH MESH;  Surgeon: Ralene Ok, MD;  Location: [place];  Service: General;  Laterality: Left;  . INSERTION OF MESH Left 04/08/2015   Procedure: INSERTION OF MESH;  Surgeon: Ralene Ok, MD;  Location: [place];  Service: General;  Laterality: Left;  . LAPAROSCOPY     Endometriosis  . LEFT HEART CATHETERIZATION WITH CORONARY ANGIOGRAM N/A 12/05/2013   Procedure: LEFT HEART CATHETERIZATION WITH CORONARY ANGIOGRAM;  Surgeon: Jettie Booze, MD;  Location: [facility] CATH LAB;  Service: Cardiovascular;  Laterality: N/A;  . right kidney drained    . TEE WITHOUT CARDIOVERSION N/A 11/28/2013   Procedure: TRANSESOPHAGEAL ECHOCARDIOGRAM (TEE);  Surgeon: Thayer Headings, MD;  Location: [facility] ENDOSCOPY;  Service: Cardiovascular;  Laterality: N/A;    Family History  Problem Relation Age of Onset  . Venous thrombosis Brother   . Other Brother        BRAIN TUMOR  . Asthma Father   . Diabetes Father   . Coronary artery disease Mother   . Hypertension Mother   . Diabetes Mother   . Asthma Sister   . Diabetes type II Brother    Social History:  reports  that she has been smoking Cigarettes.  She has been smoking about 0.50 packs per day. She has never used smokeless tobacco. She reports that she drinks alcohol. She reports that she does not use drugs.  Allergies:  Allergies  Allergen Reactions  . Metoprolol Shortness Of Breath    Occurrence of shortness of breath after 3 days  . Montelukast Shortness Of Breath  . Montelukast Sodium Shortness Of Breath    ER visit for swollen throat  . Morphine And Related Shortness Of Breath and Nausea And Vomiting  . Morphine Sulfate Shortness Of Breath and Nausea And Vomiting    Swollen Throat - Able to tolerate dilaudid  . Penicillins Hives and Shortness Of Breath    Swelling on throat Has patient had a PCN reaction  causing immediate rash, facial/tongue/throat swelling, SOB or lightheadedness with hypotension: Yes Has patient had a PCN reaction causing severe rash involving mucus membranes or skin necrosis: No Has patient had a PCN reaction that required hospitalization Yes Has patient had a PCN reaction occurring within the last 10 years: No If all of the above answers are "NO", then may proceed with Cephalosporin use.   . Prednisone Anaphylaxis  . Prednisone Anaphylaxis  . Diltiazem Swelling  . Diltiazem Swelling  . Gabapentin Swelling  . Gabapentin Swelling    Medications Prior to Admission  Medication Sig Dispense Refill  . albuterol (PROVENTIL HFA;VENTOLIN HFA) 108 (90 Base) MCG/ACT inhaler Inhale 2 puffs into the lungs every 4 (four) hours as needed for wheezing or shortness of breath.    . ALPRAZolam (XANAX) 1 MG tablet Take 1 mg by mouth daily as needed for anxiety.    Marland Kitchen aspirin EC 81 MG tablet Take 81 mg by mouth every morning.    Marland Kitchen atorvastatin (LIPITOR) 40 MG tablet Take 40 mg by mouth daily.    . budesonide-formoterol (SYMBICORT) 80-4.5 MCG/ACT inhaler Take 2 puffs first thing in am and then another 2 puffs about 12 hours later. (Patient taking differently: Inhale 2 puffs into the lungs 2 (two) times daily. ) 1 Inhaler 11  . busPIRone (BUSPAR) 30 MG tablet Take 30 mg by mouth 2 (two) times daily.    Marland Kitchen ibuprofen (ADVIL,MOTRIN) 200 MG tablet Take 600 mg by mouth 2 (two) times daily as needed for moderate pain.     Marland Kitchen insulin glulisine (APIDRA) 100 UNIT/ML injection Inject 35 Units into the skin 2 (two) times daily with a meal.    . insulin regular human CONCENTRATED (HUMULIN R) 500 UNIT/ML injection Inject 65-85 Units into the skin 3 (three) times daily with meals. 65 units in the morning 85 units in the afternoon 85 units in the evening    . losartan (COZAAR) 25 MG tablet Take 25 mg by mouth daily.    . metFORMIN (GLUCOPHAGE) 1000 MG tablet Take 1,000 mg by mouth 2 (two) times daily with a  meal.    . ondansetron (ZOFRAN) 4 MG tablet Take 4 mg by mouth every 6 (six) hours as needed for nausea or vomiting.    . OXYGEN Inhale 3 L into the lungs at bedtime.     . pregabalin (LYRICA) 300 MG capsule Take 300 mg by mouth 2 (two) times daily.     Marland Kitchen PROVENTIL HFA 108 (90 Base) MCG/ACT inhaler INHALE TWO (2) PUFFS EVERY FOUR HOURS ASNEEDED FOR SHORTNESS OF BREATH 6.7 g 2  . QUEtiapine (SEROQUEL XR) 400 MG 24 hr tablet Take 800 mg by mouth at bedtime.    Marland Kitchen  ranitidine (ZANTAC) 150 MG tablet Take 150 mg by mouth 2 (two) times daily.    Marland Kitchen rOPINIRole (REQUIP) 0.5 MG tablet Take 0.5 mg by mouth at bedtime.    Marland Kitchen spironolactone (ALDACTONE) 50 MG tablet Take 25 mg by mouth daily.     Marland Kitchen torsemide (DEMADEX) 20 MG tablet Take 20 mg by mouth daily.    . traMADol (ULTRAM) 50 MG tablet Take 50 mg by mouth every 6 (six) hours as needed for moderate pain.    . Vitamin D, Ergocalciferol, (DRISDOL) 50000 units CAPS capsule Take 50,000 Units by mouth every 7 (seven) days. Takes each Wednesday before breakfast weekly.       Results for orders placed or performed during the hospital encounter of 03/14/17 (from the past 48 hour(s))  Glucose, capillary     Status: Abnormal   Collection Time: 03/14/17  6:06 PM  Result Value Ref Range   Glucose-Capillary 138 (H) 65 - 99 mg/dL   Comment 1 Notify RN    Review of Systems  Constitutional: Negative for chills and fever.  Respiratory: Positive for cough, shortness of breath and wheezing.   Cardiovascular: Positive for orthopnea, leg swelling and PND. Negative for chest pain.  Gastrointestinal: Negative for blood in stool and melena.  Genitourinary: Negative for dysuria.  Musculoskeletal: Positive for back pain. Negative for myalgias.  Neurological: Negative for dizziness, tremors and focal weakness.  Endo/Heme/Allergies: Does not bruise/bleed easily.  Psychiatric/Behavioral: Negative for hallucinations.  All other systems reviewed and are negative.   Blood  pressure 113/61, pulse (!) 108, temperature 98.9 F (37.2 C), temperature source Oral, resp. rate 20, SpO2 92 %. Physical Exam  Constitutional: She is oriented to person, place, and time. She appears well-developed. No distress.  Morbidly obese  Eyes: Conjunctivae are normal.  Neck: Normal range of motion. No thyromegaly present.  Difficult to make out JVD due to short neck  Cardiovascular: Normal rate and normal heart sounds.   Vascular exam: Carotid bruit soft bilateral, Femoral and popliteal pulse difficult to feel due to bodily habitus. Pedal pulse faint.   Respiratory: No respiratory distress. She has wheezes. She has no rales. She exhibits no tenderness.  GI: Soft. She exhibits distension. There is no tenderness.  Musculoskeletal: Normal range of motion. She exhibits edema.  2 plus pitting  Neurological: She is oriented to person, place, and time.  Skin: Skin is warm.  Psychiatric: She has a normal mood and affect.    Assessment/Plan 1.  Acute on chronic diastolic heart failure Echocardiogram 11/16/2015: Dynamic LVEF, 65-70% with pseudo-normal filling pattern. Mild to moderately calcified aortic valve, trace aortic stenosis with peak gradient 23, mean gradient 11 mmHg.  Lexiscan Cardilate stress test 03/30/2016: Normal LVEF, 55-65%. No evidence of ischemia.  2.  Morbid obesity 3.  COPD/centrilobular emphysema with ongoing tobacco use disorder 4.  Diabetes mellitus type II uncontrolled with hyperglycemia. 5.  Asymptomatic high-grade right carotid artery stenosis Carotid artery duplex 12/07/2016: Severe stenosis in the right internal carotid artery (>=70%). The right PSV internal/common carotid artery ratio is consistent with a stenosis of >70% (4.1). Antegrade right vertebral artery flow. Antegrade left vertebral artery flow. Follow up in six months is appropriate if clinically indicated. No significant change from 06/11/2016 6.  Hypertension  Recommendation: Patient will be  admitted to the hospital for aggressive diuresis.  She also has probably ascites and I would like to exclude liver abnormalities, we will obtain ultrasound of the abdomen also to exclude cirrhosis of liver.  Smoking  cessation discussed, we'll also place her on calorie restricted and salt restricted diet. I will repeat Echocardiogram. Stric Is/Os.   Adrian Prows, MD 03/14/2017, 6:19 PM

## 2017-03-14 NOTE — Assessment & Plan Note (Signed)
Endorses 13 pound weight gain over the past 2 weeks. Will increase torsemide to 60 mg once daily with blood pressure precautions. Strongly recommended she call her cardiologist today for further instructions.

## 2017-03-14 NOTE — Assessment & Plan Note (Signed)
Managed on statin and aspirin. Strongly recommended weight loss through improvement in diet and regular exercise.

## 2017-03-14 NOTE — Assessment & Plan Note (Signed)
Following with psychiatry, continue current regimen.

## 2017-03-14 NOTE — Assessment & Plan Note (Signed)
Managed on statin and low-dose aspirin. Following with cardiology.

## 2017-03-14 NOTE — Assessment & Plan Note (Signed)
Managed on tramadol to 3 times daily for neuropathy. Will need UDS and controlled substance contract if we are to prescribe tramadol.

## 2017-03-14 NOTE — Assessment & Plan Note (Signed)
History of years of uncontrolled A1c despite large quantities of insulin. Recent A1c of 12.2 which is an increase from her prior A1c of 10. Given chronic hyperglycemia/uncontrolled diabetes despite insulin treatment will send her to endocrinology for further evaluation.

## 2017-03-14 NOTE — Patient Instructions (Signed)
Stop by the front desk and speak with either Rosaria Ferries or Shirlean Mylar regarding your referral to Endocrinology.  Increase your torsemide to 60 mg (fluid pill) once daily. Please call your heart doctor and tell them of your weight gain.   Follow up with psychiatry as scheduled.  Schedule a follow up visit with me in 3 months or sooner if needed.  It was a pleasure to meet you today! Please don't hesitate to call me with any questions. Welcome to [facility]!

## 2017-03-14 NOTE — Progress Notes (Signed)
Subjective:    Patient ID: [name], female    DOB: 06/04/1971, 46 y.o.   MRN: [id number]  HPI  [name] is a 46 year old female who presents today to establish care and discuss the problems mentioned below. Will obtain old records.  1) Type 2 Diabetes: Diagnosed at age 58. Currently managed on Apidra BID, Humulin R 500 65-85 units three times daily, Victoza 1.6 mg daily, metformin 1000 mg BID. Her last A1C was 12.2 on 02/28/17, A1C of 10 three months ago. She is currently following through the [facility] and is managed by the pharmacist. She's struggled with uncontrolled A1C readings for years. She has a history of diabetic neuropathy in her feet and behind her eyes. She takes Tramadol 50 mg 2-3 times daily for diabetic neuropathy. She has never seen an endocrinologist.  2) Bipolar Disorder: Currently managed on Seroquel XR 400 mg, Lyrica, Buspar 30 mg BID, alprazolam 1 mg. She will be seeing a new psychiatrist in October, has not seen anyone in 7 months.   3) Essential Hypertension/CHF: Currently managed on losartan 25 mg, spironolactone 25 mg, and torsemide 40 mg. Currently following with cardiology for whom she sees every 6 months. Over the last several days she's noticed increased lower extremity edema and abdominal edema. She endorses a 13 pound weight gain in 2 weeks. She has not notified her cardiologist.  4) COPD: Currently managed on Symbicort and albuterol inhalers. She's using albuterol twice weekly on average. Currently following with pulmonology. She is still smoking and is not thinking about quitting.   5) Restless Leg Syndrome: Diagnosed years ago. Currently managed on ropinirole 0.5 mg at bedtime. Feels well managed on this.  Review of Systems  Constitutional: Negative for fatigue.  Respiratory: Negative for shortness of breath.   Cardiovascular: Positive for leg swelling. Negative for chest pain.  Gastrointestinal: Positive for abdominal distention.    Neurological: Negative for dizziness and headaches.       Diabetic neuropathy to feet  Psychiatric/Behavioral:       Feels well managed on current regimen, will be seeing a psychiatrist next month.       Past Medical History:  Diagnosis Date  . Anxiety    takes Prozac daily  . Anxiety   . Aortic valve calcification   . Asthma    Advair and Spirva daily  . Asthma   . Bipolar disorder ([place])   . CAD (coronary artery disease)    a. LHC 11/2013 done for CP/fluid retention: mild disease in prox LAD, mild-mod disease in mRCA, EF 60% with normal LVEDP. b. Normal nuc 03/2016.  Marland Kitchen CHF (congestive heart failure) ([place])   . Chronic diastolic CHF (congestive heart failure) ([place])   . CKD (chronic kidney disease), stage II   . COPD (chronic obstructive pulmonary disease) (H[facility])    a. nocturnal O2.  Marland Kitchen COPD (chronic obstructive pulmonary disease) ([place])   . Coronary artery disease   . Diabetes mellitus   . Diabetes mellitus without complication ([place])   . Family history of adverse reaction to anesthesia    mom gets nauseated  . GERD (gastroesophageal reflux disease)    takes Pepcid daily  . History of blood clots    left leg 3-49yrs ago  . Hyperlipidemia   . Hypertension   . Hypertriglyceridemia   . Inguinal hernia, left 01/2015  . Muscle spasm   . Peripheral neuropathy   . RBBB   . Seizures ([place])   . Sinus tachycardia  a. persistent since 2009.  Marland Kitchen Smokers' cough ([place])   . Stroke [facility]) 1989   left sided weakness  . TIA (transient ischemic attack)   . Tobacco abuse      Social History   Social History  . Marital status: Married    Spouse name: N/A  . Number of children: N/A  . Years of education: N/A   Occupational History  . Not on file.   Social History Main Topics  . Smoking status: Current Every Day Smoker    Packs/day: 0.50    Types: Cigarettes  . Smokeless tobacco: Never Used  . Alcohol use Yes  . Drug use: No  . Sexual activity: No   Other Topics Concern  .  Not on file   Social History Narrative   ** Merged History Encounter **       Lives in [place]   Married but lives with Boyfriend - not legally separated   Disabled - for BiPolar, Seizure disorder, diabetes   Formerly worked at [employer]          Past Surgical History:  Procedure Laterality Date  . HERNIA REPAIR    . INGUINAL HERNIA REPAIR Left 04/08/2015   Procedure: OPEN LEFT INGUINAL HERNIA REPAIR WITH MESH;  Surgeon: Ralene Ok, MD;  Location: [place];  Service: General;  Laterality: Left;  . INSERTION OF MESH Left 04/08/2015   Procedure: INSERTION OF MESH;  Surgeon: Ralene Ok, MD;  Location: [place];  Service: General;  Laterality: Left;  . LAPAROSCOPY     Endometriosis  . LEFT HEART CATHETERIZATION WITH CORONARY ANGIOGRAM N/A 12/05/2013   Procedure: LEFT HEART CATHETERIZATION WITH CORONARY ANGIOGRAM;  Surgeon: Jettie Booze, MD;  Location: [facility] CATH LAB;  Service: Cardiovascular;  Laterality: N/A;  . right kidney drained    . TEE WITHOUT CARDIOVERSION N/A 11/28/2013   Procedure: TRANSESOPHAGEAL ECHOCARDIOGRAM (TEE);  Surgeon: Thayer Headings, MD;  Location: [facility] ENDOSCOPY;  Service: Cardiovascular;  Laterality: N/A;    Family History  Problem Relation Age of Onset  . Venous thrombosis Brother   . Other Brother        BRAIN TUMOR  . Asthma Father   . Diabetes Father   . Coronary artery disease Mother   . Hypertension Mother   . Diabetes Mother   . Asthma Sister   . Diabetes type II Brother     Allergies  Allergen Reactions  . Metoprolol Shortness Of Breath    Occurrence of shortness of breath after 3 days  . Montelukast Shortness Of Breath  . Montelukast Sodium Shortness Of Breath    ER visit for swollen throat  . Morphine And Related Shortness Of Breath and Nausea And Vomiting  . Morphine Sulfate Shortness Of Breath and Nausea And Vomiting    Swollen Throat - Able to tolerate dilaudid  . Penicillins Hives and Shortness Of Breath    Swelling on  throat Has patient had a PCN reaction causing immediate rash, facial/tongue/throat swelling, SOB or lightheadedness with hypotension: Yes Has patient had a PCN reaction causing severe rash involving mucus membranes or skin necrosis: No Has patient had a PCN reaction that required hospitalization Yes Has patient had a PCN reaction occurring within the last 10 years: No If all of the above answers are "NO", then may proceed with Cephalosporin use.   . Prednisone Anaphylaxis  . Prednisone Anaphylaxis  . Diltiazem Swelling  . Diltiazem Swelling  . Gabapentin Swelling  . Gabapentin Swelling    Current  Outpatient Prescriptions on File Prior to Visit  Medication Sig Dispense Refill  . albuterol (PROVENTIL HFA;VENTOLIN HFA) 108 (90 Base) MCG/ACT inhaler Inhale 2 puffs into the lungs every 4 (four) hours as needed for wheezing or shortness of breath.    Marland Kitchen aspirin EC 81 MG tablet Take 81 mg by mouth every morning.    Marland Kitchen atorvastatin (LIPITOR) 40 MG tablet Take 40 mg by mouth daily.    . budesonide-formoterol (SYMBICORT) 80-4.5 MCG/ACT inhaler Take 2 puffs first thing in am and then another 2 puffs about 12 hours later. (Patient taking differently: Inhale 2 puffs into the lungs 2 (two) times daily. ) 1 Inhaler 11  . insulin glulisine (APIDRA) 100 UNIT/ML injection Inject 35 Units into the skin 2 (two) times daily with a meal.    . insulin regular human CONCENTRATED (HUMULIN R) 500 UNIT/ML injection Inject 65-85 Units into the skin 3 (three) times daily with meals. 65 units in the morning 85 units in the afternoon 85 units in the evening    . losartan (COZAAR) 25 MG tablet Take 25 mg by mouth daily.    . ondansetron (ZOFRAN) 4 MG tablet Take 4 mg by mouth every 6 (six) hours as needed for nausea or vomiting.    . OXYGEN Inhale 3 L into the lungs at bedtime.     . pregabalin (LYRICA) 300 MG capsule Take 300 mg by mouth 2 (two) times daily.     Marland Kitchen PROVENTIL HFA 108 (90 Base) MCG/ACT inhaler INHALE TWO  (2) PUFFS EVERY FOUR HOURS ASNEEDED FOR SHORTNESS OF BREATH 6.7 g 2  . QUEtiapine (SEROQUEL XR) 400 MG 24 hr tablet Take 800 mg by mouth at bedtime.    . ranitidine (ZANTAC) 150 MG tablet Take 150 mg by mouth 2 (two) times daily.    Marland Kitchen rOPINIRole (REQUIP) 0.5 MG tablet Take 0.5 mg by mouth at bedtime.    Marland Kitchen spironolactone (ALDACTONE) 50 MG tablet Take 25 mg by mouth daily.     Marland Kitchen torsemide (DEMADEX) 20 MG tablet Take 20 mg by mouth daily.    . traMADol (ULTRAM) 50 MG tablet Take 50 mg by mouth every 6 (six) hours as needed for moderate pain.    . Vitamin D, Ergocalciferol, (DRISDOL) 50000 units CAPS capsule Take 50,000 Units by mouth every 7 (seven) days. Takes each Wednesday before breakfast weekly.     Marland Kitchen ibuprofen (ADVIL,MOTRIN) 200 MG tablet Take 600 mg by mouth 2 (two) times daily as needed for moderate pain.      No current facility-administered medications on file prior to visit.     BP 120/70   Pulse (!) 102   Temp 98.2 F (36.8 C) (Oral)   Ht 5\' 6"  (1.676 m)   Wt (!) 302 lb 1.9 oz (137 kg)   SpO2 90%   BMI 48.76 kg/m    Objective:   Physical Exam  Constitutional: She appears well-nourished.  Neck: Neck supple.  Cardiovascular: Normal rate and regular rhythm.   Pulmonary/Chest: Effort normal and breath sounds normal.  Abdominal: Bowel sounds are normal. She exhibits distension. There is no tenderness.  Large abdominal girth, firm upon palpation.  Skin: Skin is warm and dry.  Psychiatric: She has a normal mood and affect.          Assessment & Plan:

## 2017-03-15 ENCOUNTER — Inpatient Hospital Stay (HOSPITAL_COMMUNITY): Payer: Medicare Other

## 2017-03-15 ENCOUNTER — Encounter (HOSPITAL_COMMUNITY): Payer: Self-pay | Admitting: General Practice

## 2017-03-15 LAB — HEPATIC FUNCTION PANEL
ALT: 27 U/L (ref 14–54)
AST: 20 U/L (ref 15–41)
Albumin: 3.7 g/dL (ref 3.5–5.0)
Alkaline Phosphatase: 126 U/L (ref 38–126)
Bilirubin, Direct: 0.1 mg/dL (ref 0.1–0.5)
Indirect Bilirubin: 0.8 mg/dL (ref 0.3–0.9)
Total Bilirubin: 0.9 mg/dL (ref 0.3–1.2)
Total Protein: 7.7 g/dL (ref 6.5–8.1)

## 2017-03-15 LAB — BASIC METABOLIC PANEL
Anion gap: 12 (ref 5–15)
BUN: 34 mg/dL — ABNORMAL HIGH (ref 6–20)
CO2: 29 mmol/L (ref 22–32)
Calcium: 10 mg/dL (ref 8.9–10.3)
Chloride: 95 mmol/L — ABNORMAL LOW (ref 101–111)
Creatinine, Ser: 1.32 mg/dL — ABNORMAL HIGH (ref 0.44–1.00)
GFR calc Af Amer: 54 mL/min — ABNORMAL LOW (ref >60)
GFR calc non Af Amer: 47 mL/min — ABNORMAL LOW (ref >60)
Glucose, Bld: 308 mg/dL — ABNORMAL HIGH (ref 65–99)
Potassium: 4 mmol/L (ref 3.5–5.1)
Sodium: 136 mmol/L (ref 135–145)

## 2017-03-15 LAB — ECHOCARDIOGRAM COMPLETE
Height: 66 in
Weight: 4721.6 oz

## 2017-03-15 LAB — GLUCOSE, CAPILLARY: Glucose-Capillary: 348 mg/dL — ABNORMAL HIGH (ref 65–99)

## 2017-03-15 MED ORDER — PERFLUTREN LIPID MICROSPHERE
1.0000 mL | INTRAVENOUS | Status: AC | PRN
  Administered 2017-03-15: 2 mL via INTRAVENOUS
  Filled 2017-03-15: qty 10

## 2017-03-15 MED ORDER — INSULIN REGULAR HUMAN (CONC) 500 UNIT/ML ~~LOC~~ SOLN
85.0000 [IU] | Freq: Every day | SUBCUTANEOUS | Status: DC
  Administered 2017-03-15 – 2017-03-16 (×2): 85 [IU] via SUBCUTANEOUS

## 2017-03-15 MED ORDER — INSULIN REGULAR HUMAN (CONC) 500 UNIT/ML ~~LOC~~ SOLN
65.0000 [IU] | Freq: Every day | SUBCUTANEOUS | Status: DC
  Administered 2017-03-16 – 2017-03-17 (×2): 65 [IU] via SUBCUTANEOUS
  Filled 2017-03-15 (×3): qty 20

## 2017-03-15 NOTE — Progress Notes (Signed)
Inpatient Diabetes Program Recommendations  AACE/ADA: New Consensus Statement on Inpatient Glycemic Control (2015)  Target Ranges:  Prepandial:   less than 140 mg/dL      Peak postprandial:   less than 180 mg/dL (1-2 hours)      Critically ill patients:  140 - 180 mg/dL   Lab Results  Component Value Date   GLUCAP 348 (H) 03/15/2017   HGBA1C 12.1 (H) 03/14/2017    Spoke with patient about diabetes and home regimen for diabetes control. Patient reports that she is followed by her PCP Alma Friendly for DM management. Last time patient saw PCP was yesterday 9/4. PCP noted an increase of A1c level from 10-12%. PCP is making Endocrinology referral. Patient reports only seeing Belenda Cruise for a little while. She did have another PCP before she "let her go" because of not managing her medical issues with diabetes. Patient reports following a DM diet and says her sugars are high at home. Patient says she moves around the house and goes out every once in awhile.  Discussed importance of checking CBGs and maintaining good CBG control to prevent long-term and short-term complications. Encouraged patient to see Endocrinologist that she will be referred to. Patient verbalized understanding and has no further questions at this time.   Thanks,  Tama Headings RN, MSN, [place] [facility] Inpatient Diabetes Coordinator Team Pager [phone] (8a-5p)

## 2017-03-15 NOTE — Progress Notes (Signed)
Subjective:  Feels well this morning. Has had 2.7 L urine output. Eating breakfast this morning.  Objective:  Vital Signs in the last 24 hours: Temp:  [97.7 F (36.5 C)-98.9 F (37.2 C)] 97.7 F (36.5 C) (09/05 0603) Pulse Rate:  [102-109] 109 (09/05 0812) Resp:  [18-20] 18 (09/05 0812) BP: (113-126)/(59-73) 121/73 (09/05 0603) SpO2:  [90 %-97 %] 97 % (09/05 0812) Weight:  [133.9 kg (295 lb 1.6 oz)-137 kg (302 lb 1.9 oz)] 133.9 kg (295 lb 1.6 oz) (09/05 0603)  Intake/Output from previous day: 09/04 0701 - 09/05 0700 In: -  Out: 2700 [Urine:2700] Intake/Output from this shift: No intake/output data recorded.  Physical Exam: Physical Exam  Constitutional: She is oriented to person, place, and time. She appears well-developed.  Morbidly obese  HENT:  Head: Normocephalic and atraumatic.  Left carotid bruit  Neck: JVD (Estimated CVP and 10-15 cm) present.  Cardiovascular: Regular rhythm.   No murmur heard. Tachycardic  Respiratory: Effort normal. She has no wheezes. She has rales (bibasilar).  GI: Soft. Bowel sounds are normal. There is no tenderness.  Musculoskeletal: She exhibits edema (2+ bilaterally).  Neurological: She is alert and oriented to person, place, and time.  Skin: Skin is warm and dry.   Lab Results:  Recent Labs  03/14/17 1840  WBC 13.3*  HGB 11.6*  PLT 163    Recent Labs  03/14/17 1840 03/15/17 0550  NA 138 136  K 4.6 4.0  CL 99* 95*  CO2 30 29  GLUCOSE 142* 308*  BUN 36* 34*  CREATININE 1.31* 1.32*    Cardiac Studies: Echocardiogram 11/16/2015: Dynamic LVEF, 65-70% with pseudo-normal filling pattern. Mild to moderately calcified aortic valve, trace aortic stenosis with peak gradient 23, mean gradient 11 mmHg.  Lexiscan Cardilate stress test 03/30/2016: Normal LVEF, 55-65%. No evidence of ischemia.  2.  Morbid obesity 3.  COPD/centrilobular emphysema with ongoing tobacco use disorder 4.  Diabetes mellitus type II uncontrolled 5.   Asymptomatic high-grade right carotid artery stenosis Carotid artery duplex 12/07/2016: Severe stenosis in the right internal carotid artery (>=70%). The right PSV internal/common carotid artery ratio is consistent with a stenosis of >70% (4.1). Antegrade right vertebral artery flow. Antegrade left vertebral artery flow. Follow up in six months is appropriate if clinically indicated. No significant change from 06/11/2016 6.  Hypertension   Assessment/Plan:   46 year old Caucasian female with acute on chronic diastolic heart failure. Also has hypertension, diabetes mellitus, morbid obesity, COPD, Left >70% carotid stenosis  Recommendation: Urine output 2.7 L since admission. Would continue aggressive diuresis with IV Lasix 40 mg every 6 hours. Plan on switching to twice a day IV Lasix on 03/16/2017, then by mouth Lasix and discharged by 03/17/2017. Salt restriction diet. Discussed about dietary and medication compliance with the patient. Will check a hepatic function panel. Continue aspirin/Lipitor/bisoprolol/losartan/spironolactone She is tachycardic, but I believe this is secondary to her heart failure decompensation. We'll reassess tachycardia of the continued diuresis. We'll readdress her asymptomatic carotid stenosis as outpatient basis. Need to ensure aggressive medical therapy first.    LOS: 1 day    Jamilah Jean J Joshual Terrio 03/15/2017, 8:15 AM  Jalei Shibley Esther Hardy, MD [facility] [place] Pager: [phone] Office: [phone] If no answer Cell [phone]

## 2017-03-15 NOTE — Care Management Note (Signed)
Case Management Note  Patient Details  Name: Linnae Rasool MRN: [id number] Date of Birth: [birth year]  Subjective/Objective:        CHF           Action/Plan: Patient recently admitted to the hospital and left [facility] 02/28/2017; Lives at home with spouse; PCP: Alvester Chou, NP; has private insurance with Medicare / BCBS with prescription drug coverage; CM following for DCP  Expected Discharge Date:     Possibly 03/18/2017             Expected Discharge Plan:  Home/Self Care  In-House Referral:   [facility]  Discharge planning Services  CM Consult   Status of Service:  In process, will continue to follow  Sherrilyn Rist [phone] 03/15/2017, 12:38 PM

## 2017-03-15 NOTE — Progress Notes (Addendum)
Nutrition Consult/Brief Note  RD consulted for education regarding 2 gm Na/1600-1800 calorie restricted diet.  Upon room entry, pt irritabley stated "I'm trying to sleep". RD provided "Low Sodium Nutrition Therapy" and "1800 Calorie Sample Meal Plan" handouts from the Academy of Nutrition and Dietetics.   Body mass index is 47.63 kg/m. Pt meets criteria for Obesity Class III based on current BMI.  Current diet order is Renal/Carbohydrate Modified, pt is consuming approximately 100% of meals at this time.   Labs and medications reviewed. CBG's [phone].  No further nutrition interventions warranted at this time.  If additional nutrition issues arise, please re-consult RD.   Arthur Holms, RD, LDN Pager #: [phone] After-Hours Pager #: [phone]

## 2017-03-15 NOTE — Progress Notes (Signed)
  Echocardiogram 2D Echocardiogram has been performed.  [name] 03/15/2017, 9:57 AM

## 2017-03-16 LAB — BASIC METABOLIC PANEL
Anion gap: 9 (ref 5–15)
BUN: 41 mg/dL — ABNORMAL HIGH (ref 6–20)
CO2: 31 mmol/L (ref 22–32)
Calcium: 9.5 mg/dL (ref 8.9–10.3)
Chloride: 93 mmol/L — ABNORMAL LOW (ref 101–111)
Creatinine, Ser: 1.52 mg/dL — ABNORMAL HIGH (ref 0.44–1.00)
GFR calc Af Amer: 46 mL/min — ABNORMAL LOW (ref >60)
GFR calc non Af Amer: 39 mL/min — ABNORMAL LOW (ref >60)
Glucose, Bld: 282 mg/dL — ABNORMAL HIGH (ref 65–99)
Potassium: 3.9 mmol/L (ref 3.5–5.1)
Sodium: 133 mmol/L — ABNORMAL LOW (ref 135–145)

## 2017-03-16 LAB — GLUCOSE, CAPILLARY
Glucose-Capillary: 316 mg/dL — ABNORMAL HIGH (ref 65–99)
Glucose-Capillary: 255 mg/dL — ABNORMAL HIGH (ref 65–99)
Glucose-Capillary: 372 mg/dL — ABNORMAL HIGH (ref 65–99)
Glucose-Capillary: 316 mg/dL — ABNORMAL HIGH (ref 65–99)
Glucose-Capillary: 259 mg/dL — ABNORMAL HIGH (ref 65–99)

## 2017-03-16 MED ORDER — FUROSEMIDE 10 MG/ML IJ SOLN: 40.0000 mg | Freq: Two times a day (BID) | INTRAMUSCULAR | Status: DC

## 2017-03-16 MED ORDER — FUROSEMIDE 10 MG/ML IJ SOLN
40.0000 mg | Freq: Once | INTRAMUSCULAR | Status: AC
  Administered 2017-03-16: 40 mg via INTRAVENOUS
  Filled 2017-03-16: qty 4

## 2017-03-16 MED ORDER — FUROSEMIDE 40 MG PO TABS
40.0000 mg | ORAL_TABLET | Freq: Two times a day (BID) | ORAL | Status: DC
  Administered 2017-03-16: 40 mg via ORAL
  Filled 2017-03-16 (×2): qty 1

## 2017-03-16 NOTE — Progress Notes (Addendum)
0929 - BP 82/51, HR 83. 1157 - BP 92/58, HR 77. Dr. Einar Gip has been notified.  No new orders at this time.

## 2017-03-16 NOTE — Consult Note (Signed)
   [facility] CM Inpatient Consult   03/16/2017  [name] 1970-10-01 [id number]  Patient screened for potential [place] Management services. Patient is in the [place] with Medicare  plan. Met with patient and  spouse at the bedside.  Patient not feeling well issues with her blood pressure.  She is currently resting.  Asked to leave the information at the bedside.  Left a bochure and information regarding the HF EMMI calls.  Patient's primary care provider is Alma Friendly with [facility].  This office is listed to provide their own transition of care calls and follow up.  Please place a [facility] Care Management consult or for questions contact:   Natividad Brood, RN BSN [facility] Liaison  [phone] business mobile phone Toll free office [phone]

## 2017-03-16 NOTE — Progress Notes (Signed)
Subjective:  Feels well this morning.  Eating breakfast this morning. Husband has lot of questions about diet and medications  Objective:  Vital Signs in the last 24 hours: Temp:  [97.6 F (36.4 C)-98.2 F (36.8 C)] 98 F (36.7 C) (09/06 0929) Pulse Rate:  [74-88] 78 (09/06 1300) Resp:  [18-20] 18 (09/06 0929) BP: (82-116)/(51-70) 88/53 (09/06 1300) SpO2:  [92 %-100 %] 95 % (09/06 1300) Weight:  [132.2 kg (291 lb 8 oz)] 132.2 kg (291 lb 8 oz) (09/06 0551)  Tachycardia resolved -3.1 L net negative for hospital admission Elevated Cr 1.5 from 1.3 Na 133   Intake/Output from previous day: 09/05 0701 - 09/06 0700 In: 52 [P.O.:880] Out: 1370 [Urine:1370] Intake/Output from this shift: Total I/O In: 175 [P.O.:175] Out: -   Physical Exam: Physical Exam  Constitutional: She is oriented to person, place, and time. She appears well-developed.  Morbidly obese  HENT:  Head: Normocephalic and atraumatic.  Left carotid bruit  Neck: JVD (Estimated CVP and 5-10 cm) present.  Cardiovascular: Regular rhythm.   No murmur heard. Tachycardic  Respiratory: Effort normal. She has no wheezes. She has left basal rales GI: Soft. Bowel sounds are normal. There is no tenderness.  Musculoskeletal: She exhibits edema (trace to 1+ bilaterally).  Neurological: She is alert and oriented to person, place, and time.  Skin: Skin is warm and dry.   Lab Results:  Recent Labs  03/14/17 1840  WBC 13.3*  HGB 11.6*  PLT 163    Recent Labs  03/15/17 0550 03/16/17 0522  NA 136 133*  K 4.0 3.9  CL 95* 93*  CO2 29 31  GLUCOSE 308* 282*  BUN 34* 41*  CREATININE 1.32* 1.52*    Cardiac Studies: Echocardiogram 11/16/2015: Dynamic LVEF, 65-70% with pseudo-normal filling pattern. Mild to moderately calcified aortic valve, trace aortic stenosis with peak gradient 23, mean gradient 11 mmHg.  Lexiscan Cardilate stress test 03/30/2016: Normal LVEF, 55-65%. No evidence of ischemia.  2.  Morbid  obesity 3.  COPD/centrilobular emphysema with ongoing tobacco use disorder 4.  Diabetes mellitus type II uncontrolled 5.  Asymptomatic high-grade right carotid artery stenosis Carotid artery duplex 12/07/2016: Severe stenosis in the right internal carotid artery (>=70%). The right PSV internal/common carotid artery ratio is consistent with a stenosis of >70% (4.1). Antegrade right vertebral artery flow. Antegrade left vertebral artery flow. Follow up in six months is appropriate if clinically indicated. No significant change from 06/11/2016 6.  Hypertension   Assessment/Plan:   46 year old Caucasian female  Acute on chronic diastolic heart failure Hypertension Diabetes mellitus Morbid obesity COPD >70% asymptomatic left carotid stenosis Hyponatremia and AKI   Recommendation: Hyponatremia likely depletional at this stage.   Mild acute kidney injury also likely result half diuresis. One dose IV lasix 40 mg and switch to 40 mg PO bid. Salt restriction diet. Discussed about dietary and medication compliance with the patient. Continue aspirin/Lipitor/bisoprolol/losartan/spironolactone We'll readdress her asymptomatic carotid stenosis as outpatient basis. Need to ensure aggressive medical therapy first.  Possible discharge 03/17/2017    LOS: 2 days    [name] J Hildreth Orsak 03/16/2017, 1:46 PM  Yareth Kearse Esther Hardy, MD [facility] [place] Pager: [phone] Office: [phone] If no answer Cell [phone]

## 2017-03-17 ENCOUNTER — Inpatient Hospital Stay (HOSPITAL_COMMUNITY): Payer: Medicare Other

## 2017-03-17 DIAGNOSIS — R400 Somnolence: Secondary | ICD-10-CM

## 2017-03-17 DIAGNOSIS — G92 Toxic encephalopathy: Secondary | ICD-10-CM

## 2017-03-17 DIAGNOSIS — I5033 Acute on chronic diastolic (congestive) heart failure: Secondary | ICD-10-CM

## 2017-03-17 LAB — BASIC METABOLIC PANEL
Anion gap: 12 (ref 5–15)
Anion gap: 14 (ref 5–15)
Anion gap: 13 (ref 5–15)
BUN: 56 mg/dL — ABNORMAL HIGH (ref 6–20)
BUN: 62 mg/dL — ABNORMAL HIGH (ref 6–20)
BUN: 66 mg/dL — ABNORMAL HIGH (ref 6–20)
CO2: 31 mmol/L (ref 22–32)
CO2: 27 mmol/L (ref 22–32)
CO2: 26 mmol/L (ref 22–32)
Calcium: 9.5 mg/dL (ref 8.9–10.3)
Calcium: 9.3 mg/dL (ref 8.9–10.3)
Calcium: 9 mg/dL (ref 8.9–10.3)
Chloride: 92 mmol/L — ABNORMAL LOW (ref 101–111)
Chloride: 91 mmol/L — ABNORMAL LOW (ref 101–111)
Chloride: 95 mmol/L — ABNORMAL LOW (ref 101–111)
Creatinine, Ser: 2.54 mg/dL — ABNORMAL HIGH (ref 0.44–1.00)
Creatinine, Ser: 2.71 mg/dL — ABNORMAL HIGH (ref 0.44–1.00)
Creatinine, Ser: 3.08 mg/dL — ABNORMAL HIGH (ref 0.44–1.00)
GFR calc Af Amer: 25 mL/min — ABNORMAL LOW (ref >60)
GFR calc Af Amer: 23 mL/min — ABNORMAL LOW (ref >60)
GFR calc Af Amer: 19 mL/min — ABNORMAL LOW (ref >60)
GFR calc non Af Amer: 21 mL/min — ABNORMAL LOW (ref >60)
GFR calc non Af Amer: 20 mL/min — ABNORMAL LOW (ref >60)
GFR calc non Af Amer: 17 mL/min — ABNORMAL LOW (ref >60)
Glucose, Bld: 118 mg/dL — ABNORMAL HIGH (ref 65–99)
Glucose, Bld: 177 mg/dL — ABNORMAL HIGH (ref 65–99)
Glucose, Bld: 132 mg/dL — ABNORMAL HIGH (ref 65–99)
Potassium: 3.9 mmol/L (ref 3.5–5.1)
Potassium: 4.5 mmol/L (ref 3.5–5.1)
Potassium: 4.3 mmol/L (ref 3.5–5.1)
Sodium: 135 mmol/L (ref 135–145)
Sodium: 132 mmol/L — ABNORMAL LOW (ref 135–145)
Sodium: 134 mmol/L — ABNORMAL LOW (ref 135–145)

## 2017-03-17 LAB — GLUCOSE, CAPILLARY
Glucose-Capillary: 143 mg/dL — ABNORMAL HIGH (ref 65–99)
Glucose-Capillary: 220 mg/dL — ABNORMAL HIGH (ref 65–99)
Glucose-Capillary: 131 mg/dL — ABNORMAL HIGH (ref 65–99)
Glucose-Capillary: 159 mg/dL — ABNORMAL HIGH (ref 65–99)

## 2017-03-17 LAB — BLOOD GAS, ARTERIAL
Acid-Base Excess: 3.1 mmol/L — ABNORMAL HIGH (ref 0.0–2.0)
Bicarbonate: 27.4 mmol/L (ref 20.0–28.0)
Drawn by: 270211
O2 Content: 2 L/min
O2 Saturation: 96.2 %
Patient temperature: 98.6
pCO2 arterial: 44.6 mmHg (ref 32.0–48.0)
pH, Arterial: 7.406 (ref 7.350–7.450)
pO2, Arterial: 87.7 mmHg (ref 83.0–108.0)

## 2017-03-17 LAB — AMMONIA: Ammonia: 37 umol/L — ABNORMAL HIGH (ref 9–35)

## 2017-03-17 LAB — CBC
HCT: 33.8 % — ABNORMAL LOW (ref 36.0–46.0)
Hemoglobin: 10.9 g/dL — ABNORMAL LOW (ref 12.0–15.0)
MCH: 26.7 pg (ref 26.0–34.0)
MCHC: 32.2 g/dL (ref 30.0–36.0)
MCV: 82.8 fL (ref 78.0–100.0)
Platelets: 148 10*3/uL — ABNORMAL LOW (ref 150–400)
RBC: 4.08 MIL/uL (ref 3.87–5.11)
RDW: 19.2 % — ABNORMAL HIGH (ref 11.5–15.5)
WBC: 11.3 10*3/uL — ABNORMAL HIGH (ref 4.0–10.5)

## 2017-03-17 LAB — LACTIC ACID, PLASMA
Lactic Acid, Venous: 1.1 mmol/L (ref 0.5–1.9)
Lactic Acid, Venous: 1.1 mmol/L (ref 0.5–1.9)

## 2017-03-17 LAB — TSH: TSH: 1.158 u[IU]/mL (ref 0.350–4.500)

## 2017-03-17 LAB — MRSA PCR SCREENING: MRSA by PCR: NEGATIVE

## 2017-03-17 MED ORDER — SODIUM CHLORIDE 0.9 % IV SOLN: INTRAVENOUS | Status: DC

## 2017-03-17 MED ORDER — NALOXONE HCL 0.4 MG/ML IJ SOLN
0.4000 mg | INTRAMUSCULAR | Status: DC | PRN
  Administered 2017-03-17: 0.4 mg via INTRAVENOUS

## 2017-03-17 MED ORDER — NALOXONE HCL 0.4 MG/ML IJ SOLN
INTRAMUSCULAR | Status: AC
  Administered 2017-03-17: 0.4 mg
  Filled 2017-03-17: qty 1

## 2017-03-17 MED ORDER — NALOXONE HCL 0.4 MG/ML IJ SOLN
INTRAMUSCULAR | Status: AC
  Filled 2017-03-17: qty 1

## 2017-03-17 MED ORDER — SODIUM CHLORIDE 0.9 % IV BOLUS (SEPSIS)
250.0000 mL | Freq: Once | INTRAVENOUS | Status: AC
  Administered 2017-03-17: 250 mL via INTRAVENOUS

## 2017-03-17 MED ORDER — SODIUM CHLORIDE 0.9 % IV SOLN
INTRAVENOUS | Status: AC
  Administered 2017-03-17 – 2017-03-18 (×2): via INTRAVENOUS

## 2017-03-17 NOTE — Code Documentation (Signed)
46yo female who was admitted on 03/14/2017 d/t dyspnea and orthopnea with abdominal distension and leg edema.  Patient diuresed and doing well until this morning per bedside RN.  Patient found lying haphazardly in bed this morning and was noted to be sleepy per bedside RN.  Patient noted to have "white powder" around her mouth on assessment and an inhaler was found in her pillow case per bedside RN.  Patient went to radiology and once back to her room was assisted to sit up to eat her breakfast and was later noticed to have spilled her tray on the floor.  Patient was still at her baseline at that time per RN and was administered medications.  Of note, patient received Xanax, Buspar, Lyrica and Ultram this morning. RN later called to the room by family and patient was minimally responsive.  LKW 1000 per bedside RN.  Code stroke and RRT RN called.  Stroke RN to the bedside.  RRT RN at the bedside and Narcan being administered.  Cardiologist to the bedside.  Second dose of Narcan given.  Patient with non-focal exam - NIHSS 14, patient lethargic requiring repeated stimulation to participate in exam.  Patient transferred to CT with stroke team.  ABG drawn by RT in CT.  Patient transported back to 3E11.  Dr. Rory Percy at the bedside.  No acute stroke treatment at this time.  Patient to have MRI.  Patient to transfer to SDU.  Of note, patient hypotensive with SBP in the 70s and IVF ordered.  Handoff with 3E RN Gabriel Cirri.

## 2017-03-17 NOTE — Progress Notes (Addendum)
0730 - at time of shift change, pt was asleep comfortably. Noted a large amount of white powdery substance on lips.   0830 - transfer staff alerted me that she needed assistance with helping pt sit up, as she was laying sideways across lower portion of bed.  Jessica, CN, and myself went to room and helped pt sit up.  Her responses were clear and appropriate.  Pt was assisted to stand so that we could position her in bed for transfer for chest xray.  Pt stumbled with ambulation, but was still responding appropriately.  White powdery substance still on lips.  Asked pt if she had taken any medication that I was not aware of.  Pt stated no.  Discovered a albuterol inhaler hidden in pt pillow case.  I informed pt that we would hold this home medication for her until her discharge.  Pt expressed disagreement and not happy about [place] holding this medication. 53 - pt transferred to xray. 5009 - pt returns to unit; Jessica, CN, and myself went to room to assist with positioning pt in bed.  Breakfast was waiting in room, and I asked pt if she wanted her tray table over her in bed, or did she want to sit up on side of bed to eat, as she usually eats sitting up on side of bed.  Pt stated that she wanted to sit up on side of bed to eat.  I assisted her to sit on side of bed, and positioned her breakfast tray in front of her.   Spry, mobility technician notified me that pt had spilled her breakfast on floor.  I asked Alinda Sierras, Network engineer, to have EVS clean the room of all spilled food. 0959 - gave pt all of her AM meds, including lyrica.  Pt requested the PRN ordered ultram and xanax together, as I have been giving her these medications at the same time for past 3 days without incident.  These are medications that she takes at home, as well.  Completed home medication form.  Monette, Mudlogger, took albuterol home med to pharmacy for storage. [place], CN, called me to room, stating that family was now bedside, and  that pt was not at her baseline.  VS taken.. BP 133/88, HR 79.  CBG 220.  Pt lethargic, slow to respond, and not easily arousable. Groom, RRT, was called.  Vernell Leep, MD, Cards, was notified.  Dr. Virgina Jock asked that I call a Code Stroke.  Code Stroke was called. [place], RRT, came to room.  Dr. Virgina Jock came to room.  Pharmacists came to room.  Dr. Rory Percy came to room. 1104 - Narcan administered x2 without change. 1115 - CT Head Code Stroke orders placed, and performed, with negative results.  ABGs drawn with negative results. 1141 - transfer orders placed to 2C05. 1200 - report called to receiving nurse on 2C05. 1250 - pt transferred tp 2C05.
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
## 2017-03-17 NOTE — Progress Notes (Signed)
Spoke to [place], Therapist, sports, receiving nurse for [phone], to inform her that pt has a home medication albuterol inhaler being stored in pharmacy.  This medication should be retrieved for pt prior to discharge.

## 2017-03-17 NOTE — Progress Notes (Signed)
Contacted Daleyza Gadomski (husband) at [phone], to inform him that pt has been transferred to stepdown unit 2C05, as per order.

## 2017-03-17 NOTE — Significant Event (Signed)
Rapid Response Event Note  Overview: Time Called: 5615 Arrival Time: 3794 Event Type: Neurologic  Initial Focused Assessment: Patient difficult to arouse.  Will open eyes but not much else. BP 79/49  HR 72  RR 16  O2 sat 100% on 3L [place] Lung sound clear decreased bases  Interventions: Narcan x 2 with minimal change.  She will cough on command. But falls asleep rapidly ABG done Code Stroke called by attending for decreased LOC.  Stroke team at bedside.  Stat head CT done  250cc NS bolus for hypotension  bp 92/59  HR 70  RR 16  O2 sat 96 on 2L [place]  Patient transferred to SDU for closer monitoring.  FU: 1600  Patient more alert able to follow commands and move all extremities equally.    Plan of Care (if not transferred):  Event Summary: Name of Physician Notified: Patwardhan at [facility]  Name of Consulting Physician Notified: Rory Percy,  Code Stroke at [facility]  Outcome: Transferred (Comment)  Event End Time: [facility]  Raliegh Ip

## 2017-03-17 NOTE — Progress Notes (Signed)
RT note-ABG done down in CT with rapid response. ABG result given to rapid response all with in normal limits.

## 2017-03-17 NOTE — Progress Notes (Signed)
Subjective:  Sleepy this morning  Objective:  Vital Signs in the last 24 hours: Temp:  [97.7 F (36.5 C)-98.1 F (36.7 C)] 97.7 F (36.5 C) (09/07 0600) Pulse Rate:  [74-83] 76 (09/07 0600) Resp:  [18] 18 (09/07 0600) BP: (82-111)/(51-62) 85/57 (09/07 0600) SpO2:  [92 %-100 %] 93 % (09/07 0600) Weight:  [134 kg (295 lb 6.4 oz)] 134 kg (295 lb 6.4 oz) (09/07 0600)   -4.4 L net negative for hospital admission Elevated Cr 2.5 from 1.5 yesterday Na improved to 135   Intake/Output from previous day: 09/06 0701 - 09/07 0700 In: 415 [P.O.:415] Out: 1700 [Urine:1700] Intake/Output from this shift: No intake/output data recorded.  Physical Exam: Physical Exam  Constitutional: She is oriented to person, place, and time. She appears well-developed.  Morbidly obese  HENT:  Head: Normocephalic and atraumatic.  Left carotid bruit  Neck: JVD absent Cardiovascular: Regular rhythm.   No murmur heard. Tachycardic  Respiratory: Effort normal. She has no wheezes. She has right basal rales GI: Soft. Bowel sounds are normal. There is no tenderness.  Musculoskeletal: She exhibits no edema  Neurological: She is alert and oriented to person, place, and time.  Skin: Skin is warm and dry.   Lab Results:  Recent Labs  03/14/17 1840  WBC 13.3*  HGB 11.6*  PLT 163    Recent Labs  03/16/17 0522 03/17/17 0501  NA 133* 135  K 3.9 3.9  CL 93* 92*  CO2 31 31  GLUCOSE 282* 118*  BUN 41* 56*  CREATININE 1.52* 2.54*    Cardiac Studies: Echocardiogram 11/16/2015: Dynamic LVEF, 65-70% with pseudo-normal filling pattern. Mild to moderately calcified aortic valve, trace aortic stenosis with peak gradient 23, mean gradient 11 mmHg.  Lexiscan Cardilate stress test 03/30/2016: Normal LVEF, 55-65%. No evidence of ischemia.  2.  Morbid obesity 3.  COPD/centrilobular emphysema with ongoing tobacco use disorder 4.  Diabetes mellitus type II uncontrolled 5.  Asymptomatic high-grade right  carotid artery stenosis Carotid artery duplex 12/07/2016: Severe stenosis in the right internal carotid artery (>=70%). The right PSV internal/common carotid artery ratio is consistent with a stenosis of >70% (4.1). Antegrade right vertebral artery flow. Antegrade left vertebral artery flow. Follow up in six months is appropriate if clinically indicated. No significant change from 06/11/2016 6.  Hypertension   Assessment/Plan:   46 year old Caucasian female  Acute on chronic diastolic heart failure Hypertension Diabetes mellitus Morbid obesity COPD >70% asymptomatic left carotid stenosis Hyponatremia and AKI   Recommendation: Acute kidney injury likely result of overdiuresis. However, she continues to have right basal rales. Will get CXR Stop diuresis. Allow gentle PO hydration  Expect hospital stay for 1-2 more days till kidneys recover Continue aspirin/Lipitor/bisoprolol/losartan/spironolactone. K is Ok at 3.9 We'll readdress her asymptomatic carotid stenosis as outpatient basis. Need to ensure aggressive medical therapy first.  Possible discharge 03/17/2017    LOS: 3 days    Manish J Patwardhan 03/17/2017, 7:37 AM  Nigel Mormon, MD [facility] [place] Pager: [phone] Office: [phone] If no answer Cell [phone]

## 2017-03-17 NOTE — Progress Notes (Addendum)
Acute change in mental status.Did not response to naloxone. ABG does not suggest severe hypercarbia. She is not hypoglycemic. Borderline hypotension without any acute cardiac cause. Checking CBC, urine analysis, EKG. Evaluated by neurology with CT scan. Appreciate their help. I have talked to the family and getting PCCM consult in case of further decompensation. Will stop metofrmin in light of AKI. Stopping xanax. Gentle hydration.  I personally saw and evaluated the patient. Critical care time 30 min  [place], MD [facility] [place] Pager: [phone] Office: [phone] If no answer Cell [phone]

## 2017-03-18 DIAGNOSIS — R400 Somnolence: Secondary | ICD-10-CM

## 2017-03-18 LAB — RAPID URINE DRUG SCREEN, HOSP PERFORMED
Amphetamines: NOT DETECTED
Barbiturates: NOT DETECTED
Benzodiazepines: POSITIVE — AB
Cocaine: NOT DETECTED
Opiates: NOT DETECTED
Tetrahydrocannabinol: NOT DETECTED

## 2017-03-18 LAB — GLUCOSE, CAPILLARY
Glucose-Capillary: 167 mg/dL — ABNORMAL HIGH (ref 65–99)
Glucose-Capillary: 221 mg/dL — ABNORMAL HIGH (ref 65–99)
Glucose-Capillary: 304 mg/dL — ABNORMAL HIGH (ref 65–99)
Glucose-Capillary: 341 mg/dL — ABNORMAL HIGH (ref 65–99)

## 2017-03-18 LAB — BASIC METABOLIC PANEL
Anion gap: 12 (ref 5–15)
BUN: 69 mg/dL — ABNORMAL HIGH (ref 6–20)
CO2: 27 mmol/L (ref 22–32)
Calcium: 9 mg/dL (ref 8.9–10.3)
Chloride: 94 mmol/L — ABNORMAL LOW (ref 101–111)
Creatinine, Ser: 3.3 mg/dL — ABNORMAL HIGH (ref 0.44–1.00)
GFR calc Af Amer: 18 mL/min — ABNORMAL LOW (ref >60)
GFR calc non Af Amer: 15 mL/min — ABNORMAL LOW (ref >60)
Glucose, Bld: 131 mg/dL — ABNORMAL HIGH (ref 65–99)
Potassium: 4.3 mmol/L (ref 3.5–5.1)
Sodium: 133 mmol/L — ABNORMAL LOW (ref 135–145)

## 2017-03-18 MED ORDER — INSULIN REGULAR HUMAN (CONC) 500 UNIT/ML ~~LOC~~ SOPN: 85.0000 [IU] | PEN_INJECTOR | Freq: Every day | SUBCUTANEOUS | Status: DC

## 2017-03-18 MED ORDER — INSULIN REGULAR HUMAN (CONC) 500 UNIT/ML ~~LOC~~ SOPN
65.0000 [IU] | PEN_INJECTOR | Freq: Every day | SUBCUTANEOUS | Status: DC
  Filled 2017-03-18: qty 3

## 2017-03-18 MED ORDER — SODIUM CHLORIDE 0.9 % IV SOLN: INTRAVENOUS | Status: DC

## 2017-03-18 MED ORDER — INSULIN ASPART 100 UNIT/ML ~~LOC~~ SOLN
0.0000 [IU] | Freq: Three times a day (TID) | SUBCUTANEOUS | Status: DC
  Administered 2017-03-18: 7 [IU] via SUBCUTANEOUS
  Administered 2017-03-18: 3 [IU] via SUBCUTANEOUS
  Administered 2017-03-19: 7 [IU] via SUBCUTANEOUS

## 2017-03-18 MED ORDER — FAMOTIDINE 20 MG PO TABS: 20.0000 mg | ORAL_TABLET | Freq: Every day | ORAL | Status: DC

## 2017-03-18 MED ORDER — SODIUM CHLORIDE 0.9 % IV SOLN
INTRAVENOUS | Status: AC
  Administered 2017-03-18: 10:00:00 via INTRAVENOUS

## 2017-03-18 NOTE — Progress Notes (Signed)
S: Seen and examined. Back to baseline per patient.  O: Vitals:   03/18/17 0500 03/18/17 0515  BP: 97/60 (!) 100/57  Pulse: 72 71  Resp: 12 11  Temp:    SpO2: 97% 95%   GENERAL: Awake, alert in NAD HEENT: - Normocephalic and atraumatic, dry mm, no LN++, no Thyromegally LUNGS - Clear to auscultation bilaterally with no wheezes CV - S1S2 RRR, no m/r/g, equal pulses bilaterally. ABDOMEN - Soft, nontender, nondistended with normoactive BS  NEURO:  Mental Status: AA&Ox3 Language: speech is clear.  Naming, repetition, fluency, and comprehension intact. Cranial Nerves: PERRL 37mm/brisk. EOMI, visual fields full, no facial asymmetry, facial sensation intact, hearing intact, tongue/uvula/soft palate midline, normal sternocleidomastoid and trapezius muscle strength. No evidence of tongue atrophy or fibrillations Motor: 5/5 all over with no drift. Asterixis on outstretched hands Tone: is normal and bulk is normal Sensation- Intact to light touch bilaterally Coordination: FTN intact bilaterally, no ataxia in BLE. Gait- deferred  Labs CBC    Component Value Date/Time   WBC 11.3 (H) 03/17/2017 1250   RBC 4.08 03/17/2017 1250   HGB 10.9 (L) 03/17/2017 1250   HCT 33.8 (L) 03/17/2017 1250   PLT 148 (L) 03/17/2017 1250   MCV 82.8 03/17/2017 1250   MCH 26.7 03/17/2017 1250   MCHC 32.2 03/17/2017 1250   RDW 19.2 (H) 03/17/2017 1250   LYMPHSABS 2.6 02/27/2017 2006   MONOABS 0.8 02/27/2017 2006   EOSABS 0.4 02/27/2017 2006   BASOSABS 0.1 02/27/2017 2006   CMP     Component Value Date/Time   NA 133 (L) 03/18/2017 0241   K 4.3 03/18/2017 0241   CL 94 (L) 03/18/2017 0241   CO2 27 03/18/2017 0241   GLUCOSE 131 (H) 03/18/2017 0241   BUN 69 (H) 03/18/2017 0241   CREATININE 3.30 (H) 03/18/2017 0241   CREATININE 1.20 (H) 05/24/2016 0938   CALCIUM 9.0 03/18/2017 0241   PROT 7.7 03/15/2017 0951   ALBUMIN 3.7 03/15/2017 0951   AST 20 03/15/2017 0951   ALT 27 03/15/2017 0951   ALKPHOS 126  03/15/2017 0951   BILITOT 0.9 03/15/2017 0951   GFRNONAA 15 (L) 03/18/2017 0241   GFRAA 18 (L) 03/18/2017 0241   Ammonia 37 TSH Normal  Imaging CTH reviewed by me, no acute changes.  A: This is a 46 year old woman was admitted for CHF exacerbation, who had a sudden onset of altered mental status yesterday morning. I suspect that her symptoms were secondary to toxic metabolic encephalopathy in setting of polypharmacy and hypotension. She is back to baseline.  Imp Toxic metabolic encephalopathy Evaluate for sleep apnea as outpatient  R: -Cancel MRI - back to baseline as well as has a foreign object in scalp that might not be MRI safe. -No need for EEG -Can check UDS -Correction of toxic metabolic derangements per primary team as you are. -Minimize sedating meds as you are. -Sleep study as outpatient if deemed appropriate by primary team  Plan relayed to Dr. Virgina Jock at the bedside.  Please call with questions.  Amie Portland, MD [facility] [phone]  If 7pm to 7am, please call on call as listed on AMION.

## 2017-03-18 NOTE — Progress Notes (Signed)
Alert.  Denies chest pain.  Breathing better.  CMP Latest Ref Rng & Units 03/18/2017 03/17/2017 03/17/2017  Glucose 65 - 99 mg/dL 131(H) 132(H) 177(H)  BUN 6 - 20 mg/dL 69(H) 66(H) 62(H)  Creatinine 0.44 - 1.00 mg/dL 3.30(H) 3.08(H) 2.71(H)  Sodium 135 - 145 mmol/L 133(L) 134(L) 132(L)  Potassium 3.5 - 5.1 mmol/L 4.3 4.3 4.5  Chloride 101 - 111 mmol/L 94(L) 95(L) 91(L)  CO2 22 - 32 mmol/L 27 26 27   Calcium 8.9 - 10.3 mg/dL 9.0 9.0 9.3  Total Protein 6.5 - 8.1 g/dL - - -  Total Bilirubin 0.3 - 1.2 mg/dL - - -  Alkaline Phos 38 - 126 U/L - - -  AST 15 - 41 U/L - - -  ALT 14 - 54 U/L - - -   CBC Latest Ref Rng & Units 03/17/2017 03/14/2017 02/28/2017  WBC 4.0 - 10.5 K/uL 11.3(H) 13.3(H) 15.0(H)  Hemoglobin 12.0 - 15.0 g/dL 10.9(L) 11.6(L) 13.5  Hematocrit 36.0 - 46.0 % 33.8(L) 36.3 41.8  Platelets 150 - 400 K/uL 148(L) 163 204   Plan: Toxic metabolic encephalopathy. - resolved - primary team to adjust meds  Acute renal failure. - renal consulted by primary team  PCCM will sign off.  Chesley Mires, MD [facility] Pulmonary/Critical Care 03/18/2017, 11:40 AM Pager:  [phone] After 3pm call: [phone]

## 2017-03-18 NOTE — Progress Notes (Addendum)
Subjective:  Sleepy this morning  Events of last 24 hours noted. Acute mental status change. Now improved and back to baseline.   Objective:  Vital Signs in the last 24 hours: Temp:  [97.6 F (36.4 C)-98.4 F (36.9 C)] 97.7 F (36.5 C) (09/08 0345) Pulse Rate:  [66-79] 71 (09/08 0515) Resp:  [9-18] 11 (09/08 0515) BP: (69-133)/(38-88) 100/57 (09/08 0515) SpO2:  [86 %-100 %] 95 % (09/08 0515) Weight:  [133.1 kg (293 lb 6.4 oz)-135.9 kg (299 lb 11.2 oz)] 133.1 kg (293 lb 6.4 oz) (09/08 0515)   Creatinine increasing to 3.3 today from 2.5 yesterday. Sodium 133, potassium 4.3.   Intake/Output from previous day: 09/07 0701 - 09/08 0700 In: 36.7 [I.V.:36.7] Out: -  Intake/Output from this shift: No intake/output data recorded.  Physical Exam: Physical Exam  Constitutional: She is oriented to person, place, and time. Morbidly obese Morbidly obese  HENT:  Head: Normocephalic and atraumatic.  Neck: JVD absent Cardiovascular: Regular rhythm.   No murmur heard. Tachycardic  Respiratory: Effort normal. She has no wheezes. She has right basal rales GI: Soft. Bowel sounds are normal. There is no tenderness.  Musculoskeletal: She exhibits 1-2+ b/l edema  Neurological: She is alert and oriented to person, place, and time.  Skin: Skin is warm and dry.   Lab Results:  Recent Labs  03/17/17 1250  WBC 11.3*  HGB 10.9*  PLT 148*    Recent Labs  03/17/17 1923 03/18/17 0241  NA 134* 133*  K 4.3 4.3  CL 95* 94*  CO2 26 27  GLUCOSE 132* 131*  BUN 66* 69*  CREATININE 3.08* 3.30*    Cardiac Studies: Echocardiogram 11/16/2015: Dynamic LVEF, 65-70% with pseudo-normal filling pattern. Mild to moderately calcified aortic valve, trace aortic stenosis with peak gradient 23, mean gradient 11 mmHg.  Lexiscan Cardilate stress test 03/30/2016: Normal LVEF, 55-65%. No evidence of ischemia.  2.  Morbid obesity 3.  COPD/centrilobular emphysema with ongoing tobacco use disorder 4.   Diabetes mellitus type II uncontrolled 5.  Asymptomatic high-grade right carotid artery stenosis Carotid artery duplex 12/07/2016: Severe stenosis in the right internal carotid artery (>=70%). The right PSV internal/common carotid artery ratio is consistent with a stenosis of >70% (4.1). Antegrade right vertebral artery flow. Antegrade left vertebral artery flow. Follow up in six months is appropriate if clinically indicated. No significant change from 06/11/2016 6.  Hypertension   Assessment/Plan:    46 y/o caucasian female initially admitted for acute on chronic diastolic heart failure. Hospital stay complicated by acute kidney injury altered mental status Altered mental status likely toxic metabolic encephalopathy, possibly uremia in the setting of acute kidney injury. All offending agents stopped. Acute kidney injury likely pre-renal in nature due to borderline hypotension, diuresis, and contrast load.Neurologic workup with CT head and CTA negative for focal deficits.  H/o hypertension Diabetes mellitus COPD Morbid obesity   Based on CTA, Rt 60% IC stenosis, no other significant stenoses. Prior report was based on outpatient ultrasound study.   Recommendation: Continue IV hydration. Watch serial BUN/Cr and electrolytes. Trend in Cr increase is slowing down and she may plateau soon. However, she remains oliguric/anuric. Hold all antihypertensive medications. Hydration 100 cc/hr. Will get nephrology consult Continue ASA/statin. Metformin stopped.   Possible discharge 03/17/2017    LOS: 4 days    Manish J Patwardhan 03/18/2017, 7:04 AM  Manish Esther Hardy, MD [facility] [place] Pager: [phone] Office: [phone] If no answer Cell [phone]

## 2017-03-18 NOTE — Consult Note (Signed)
HPI: Patient with history of chronic diastolic heart failure, hypertension, diabetes mellitus, ongoing tobacco use disorder, COPD seen at [facility] recently due to rapid weight gain over  4-5 days.  Patient noticed marked dyspnea  during minimal activities and had orthopnea. She had distention of her abdomen and leg edema. She was admitted on 9/4.  Although, CXR not obtained she was diagnosed with CHF and diuresed.  She has CKD with baseline creat of about 1.2. On 9/4 BUN was 36 and creat of 1.31. Today, 9/8, BUN is 69 and creat 3.39m/dl.  Of note, patient reports taking ibuprofen 604mBID daily and losartan daily. Beginning 9/6 BP has been consistently low. Losartan stopped 9/7.  She has COPD. SHe had a hospitalization in May 2015 with complaint of fluid retention.  Past Medical History:  Diagnosis Date  . Anxiety    takes Prozac daily  . Anxiety   . Aortic valve calcification   . Asthma    Advair and Spirva daily  . Asthma   . Bipolar disorder (HCK[place]  . CAD (coronary artery disease)    a. LHC 11/2013 done for CP/fluid retention: mild disease in prox LAD, mild-mod disease in mRCA, EF 60% with normal LVEDP. b. Normal nuc 03/2016.  . Marland KitchenHF (congestive heart failure) (HCR[place]eds[place]  . Chronic diastolic CHF (congestive heart failure) (H[place]outh [place]  . CKD (chronic kidney disease), stage II   . COPD (chronic obstructive pulmonary disease) (HCC)    a. nocturnal O2.  . Marland KitchenOPD (chronic obstructive pulmonary disease) (HCBridge[place]  . Coronary artery disease   . Diabetes mellitus   . Diabetes mellitus without complication (HCSanta [place]  . Dyspnea   . Family history of adverse reaction to anesthesia    mom gets nauseated  . GERD (gastroesophageal reflux disease)    takes Pepcid daily  . History of blood clots    left leg [id number]  . Hyperlipidemia   . Hypertension   . Hypertriglyceridemia   . Inguinal hernia, left 01/2015  . Muscle spasm   . Peripheral neuropathy   . RBBB   . Seizures (HCCVis[place] . Sinus  tachycardia    a. persistent since 2009.  . SMarland Kitchenokers' cough (HCCMarinette . [facility]   left sided weakness  . TIA (transient ischemic attack)   . Tobacco abuse    Past Surgical History:  Procedure Laterality Date  . HERNIA REPAIR    . INGUINAL HERNIA REPAIR Left 04/08/2015   Procedure: OPEN LEFT INGUINAL HERNIA REPAIR WITH MESH;  Surgeon: ArmRalene OkD;  [facility];  Laterality: Left;  . INSERTION OF MESH Left 04/08/2015   Procedure: INSERTION OF MESH;  Surgeon: ArmRalene OkD;  [facility];  Laterality: Left;  . LAPAROSCOPY     Endometriosis  . LEFT HEART CATHETERIZATION WITH CORONARY ANGIOGRAM N/A 12/05/2013   Procedure: LEFT HEART CATHETERIZATION WITH CORONARY ANGIOGRAM;  Surgeon: JayJettie BoozeD;  [facility];  Service: Cardiovascular;  Laterality: N/A;  . right kidney drained    . TEE WITHOUT CARDIOVERSION N/A 11/28/2013   Procedure: TRANSESOPHAGEAL ECHOCARDIOGRAM (TEE);  Surgeon: PhiThayer HeadingsD;  [facility];  Service: Cardiovascular;  Laterality: N/A;   Social History:  reports that she has been smoking Cigarettes.  She has a 15.00 pack-year smoking history. She has never used smokeless tobacco. She reports that she drinks alcohol. She reports that she does not use drugs.  Allergies:  Allergies  Allergen Reactions  . Metoprolol Shortness Of Breath    Occurrence of shortness of breath after 3 days  . Montelukast Shortness Of Breath  . Morphine Sulfate Shortness Of Breath and Nausea And Vomiting    Swollen Throat - Able to tolerate dilaudid  . Penicillins Hives and Shortness Of Breath    Swelling on throat Has patient had a PCN reaction causing immediate rash, facial/tongue/throat swelling, SOB or lightheadedness with hypotension: Yes Has patient had a PCN reaction causing severe rash involving mucus membranes or skin necrosis: No Has patient had a PCN reaction that required  hospitalization Yes Has patient had a PCN reaction occurring within the last 10 years: No If all of the above answers are "NO", then may proceed with Cephalosporin use.   . Prednisone Anaphylaxis  . Diltiazem Swelling  . Gabapentin Swelling   Family History  Problem Relation Age of Onset  . Venous thrombosis Brother   . Other Brother        BRAIN TUMOR  . Asthma Father   . Diabetes Father   . Coronary artery disease Mother   . Hypertension Mother   . Diabetes Mother   . Asthma Sister   . Diabetes type II Brother     Medications:  Scheduled: . aspirin EC  81 mg Oral q morning - 10a  . atorvastatin  40 mg Oral Daily  . [START ON 03/19/2017] famotidine  20 mg Oral Daily  . heparin  5,000 Units Subcutaneous Q8H  . insulin aspart  0-9 Units Subcutaneous TID WC  . mometasone-formoterol  2 puff Inhalation BID  . sodium chloride flush  3 mL Intravenous Q12H  . Vitamin D (Ergocalciferol)  50,000 Units Oral Q Wed   ROS: as per HPI, recent altered MS Blood pressure (!) 135/110, pulse 71, temperature (!) 97.1 F (36.2 C), temperature source Oral, resp. rate 11, height _0  (1.676 m), weight 133.1 kg (293 lb 6.4 oz), SpO2 96 %.  General appearance: alert and cooperative Head: Normocephalic, without obvious abnormality, atraumatic Eyes: negative Ears: normal TM's and external ear canals both ears Nose: Nares normal. Septum midline. Mucosa normal. No drainage or sinus tenderness. Throat: lips, mucosa, and tongue normal; teeth and gums normal Resp: clear to auscultation bilaterally Chest wall: no tenderness Cardio: regular rate and rhythm, S1, S2 normal, no murmur, click, rub or gallop GI: obese  Ext 1+ to tr  Results for orders placed or performed during the hospital encounter of 03/14/17 (from the past 48 hour(s))  Glucose, capillary     Status: Abnormal   Collection Time: 03/16/17  9:06 PM  Result Value Ref Range   Glucose-Capillary 259 (H) 65 - 99 mg/dL   Comment 1 Notify RN     Comment 2 Document in Chart   Basic metabolic panel     Status: Abnormal   Collection Time: 03/17/17  5:01 AM  Result Value Ref Range   Sodium 135 135 - 145 mmol/L   Potassium 3.9 3.5 - 5.1 mmol/L   Chloride 92 (L) 101 - 111 mmol/L   CO2 31 22 - 32 mmol/L   Glucose, Bld 118 (H) 65 - 99 mg/dL   BUN 56 (H) 6 - 20 mg/dL   Creatinine, Ser 2.54 (H) 0.44 - 1.00 mg/dL    Comment: DELTA CHECK NOTED   Calcium 9.5 8.9 - 10.3 mg/dL   GFR calc non Af Amer 21 (L) >60 mL/min   GFR calc Af Amer 25 (L) >  60 mL/min    Comment: (NOTE) The eGFR has been calculated using the CKD EPI equation. This calculation has not been validated in all clinical situations. eGFR's persistently <60 mL/min signify possible Chronic Kidney Disease.    Anion gap 12 5 - 15  Glucose, capillary     Status: Abnormal   Collection Time: 03/17/17  8:30 AM  Result Value Ref Range   Glucose-Capillary 143 (H) 65 - 99 mg/dL   Comment 1 Notify RN    Comment 2 Document in Chart   Glucose, capillary     Status: Abnormal   Collection Time: 03/17/17 10:37 AM  Result Value Ref Range   Glucose-Capillary 220 (H) 65 - 99 mg/dL   Comment 1 Notify RN   Blood gas, arterial     Status: Abnormal   Collection Time: 03/17/17 11:22 AM  Result Value Ref Range   O2 Content 2.0 L/min   Delivery systems NASAL CANNULA    pH, Arterial 7.406 7.350 - 7.450   pCO2 arterial 44.6 32.0 - 48.0 mmHg   pO2, Arterial 87.7 83.0 - 108.0 mmHg   Bicarbonate 27.4 20.0 - 28.0 mmol/L   Acid-Base Excess 3.1 (H) 0.0 - 2.0 mmol/L   O2 Saturation 96.2 %   Patient temperature 98.6    Collection site RIGHT RADIAL    Drawn by [id number]    Sample type ARTERIAL DRAW    Allens test (pass/fail) PASS PASS    Comment: Performed at [facility], [place] [address], [place], [place] [address]  Urine rapid drug screen (hosp performed)     Status: Abnormal   Collection Time: 03/17/17 12:07 PM  Result Value Ref Range   Opiates NONE DETECTED NONE DETECTED    Cocaine NONE DETECTED NONE DETECTED   Benzodiazepines POSITIVE (A) NONE DETECTED   Amphetamines NONE DETECTED NONE DETECTED   Tetrahydrocannabinol NONE DETECTED NONE DETECTED   Barbiturates NONE DETECTED NONE DETECTED    Comment:        DRUG SCREEN FOR MEDICAL PURPOSES ONLY.  IF CONFIRMATION IS NEEDED FOR ANY PURPOSE, NOTIFY LAB WITHIN 5 DAYS.        LOWEST DETECTABLE LIMITS FOR URINE DRUG SCREEN Drug Class       Cutoff (ng/mL) Amphetamine      1000 Barbiturate      200 Benzodiazepine   832 Tricyclics       549 Opiates          300 Cocaine          300 THC              50   Basic metabolic panel     Status: Abnormal   Collection Time: 03/17/17 12:50 PM  Result Value Ref Range   Sodium 132 (L) 135 - 145 mmol/L   Potassium 4.5 3.5 - 5.1 mmol/L   Chloride 91 (L) 101 - 111 mmol/L   CO2 27 22 - 32 mmol/L   Glucose, Bld 177 (H) 65 - 99 mg/dL   BUN 62 (H) 6 - 20 mg/dL   Creatinine, Ser 2.71 (H) 0.44 - 1.00 mg/dL   Calcium 9.3 8.9 - 10.3 mg/dL   GFR calc non Af Amer 20 (L) >60 mL/min   GFR calc Af Amer 23 (L) >60 mL/min    Comment: (NOTE) The eGFR has been calculated using the CKD EPI equation. This calculation has not been validated in all clinical situations. eGFR's persistently <60 mL/min signify possible Chronic Kidney Disease.  Anion gap 14 5 - 15  Ammonia     Status: Abnormal   Collection Time: 03/17/17 12:50 PM  Result Value Ref Range   Ammonia 37 (H) 9 - 35 umol/L  TSH     Status: None   Collection Time: 03/17/17 12:50 PM  Result Value Ref Range   TSH 1.158 0.350 - 4.500 uIU/mL    Comment: Performed by a 3rd Generation assay with a functional sensitivity of <=0.01 uIU/mL.  CBC     Status: Abnormal   Collection Time: 03/17/17 12:50 PM  Result Value Ref Range   WBC 11.3 (H) 4.0 - 10.5 K/uL   RBC 4.08 3.87 - 5.11 MIL/uL   Hemoglobin 10.9 (L) 12.0 - 15.0 g/dL   HCT 33.8 (L) 36.0 - 46.0 %   MCV 82.8 78.0 - 100.0 fL   MCH 26.7 26.0 - 34.0 pg   MCHC 32.2 30.0 -  36.0 g/dL   RDW 19.2 (H) 11.5 - 15.5 %   Platelets 148 (L) 150 - 400 K/uL  MRSA PCR Screening     Status: None   Collection Time: 03/17/17  3:59 PM  Result Value Ref Range   MRSA by PCR NEGATIVE NEGATIVE    Comment:        The GeneXpert MRSA Assay (FDA approved for NASAL specimens only), is one component of a comprehensive MRSA colonization surveillance program. It is not intended to diagnose MRSA infection nor to guide or monitor treatment for MRSA infections.   Lactic acid, plasma     Status: None   Collection Time: 03/17/17  4:39 PM  Result Value Ref Range   Lactic Acid, Venous 1.1 0.5 - 1.9 mmol/L  Glucose, capillary     Status: Abnormal   Collection Time: 03/17/17  5:13 PM  Result Value Ref Range   Glucose-Capillary 131 (H) 65 - 99 mg/dL   Comment 1 Notify RN    Comment 2 Document in Chart   Lactic acid, plasma     Status: None   Collection Time: 03/17/17  7:07 PM  Result Value Ref Range   Lactic Acid, Venous 1.1 0.5 - 1.9 mmol/L  Basic metabolic panel     Status: Abnormal   Collection Time: 03/17/17  7:23 PM  Result Value Ref Range   Sodium 134 (L) 135 - 145 mmol/L   Potassium 4.3 3.5 - 5.1 mmol/L   Chloride 95 (L) 101 - 111 mmol/L   CO2 26 22 - 32 mmol/L   Glucose, Bld 132 (H) 65 - 99 mg/dL   BUN 66 (H) 6 - 20 mg/dL   Creatinine, Ser 3.08 (H) 0.44 - 1.00 mg/dL   Calcium 9.0 8.9 - 10.3 mg/dL   GFR calc non Af Amer 17 (L) >60 mL/min   GFR calc Af Amer 19 (L) >60 mL/min    Comment: (NOTE) The eGFR has been calculated using the CKD EPI equation. This calculation has not been validated in all clinical situations. eGFR's persistently <60 mL/min signify possible Chronic Kidney Disease.    Anion gap 13 5 - 15  Glucose, capillary     Status: Abnormal   Collection Time: 03/17/17  9:41 PM  Result Value Ref Range   Glucose-Capillary 159 (H) 65 - 99 mg/dL  Basic metabolic panel     Status: Abnormal   Collection Time: 03/18/17  2:41 AM  Result Value Ref Range    Sodium 133 (L) 135 - 145 mmol/L   Potassium 4.3 3.5 - 5.1 mmol/L  Chloride 94 (L) 101 - 111 mmol/L   CO2 27 22 - 32 mmol/L   Glucose, Bld 131 (H) 65 - 99 mg/dL   BUN 69 (H) 6 - 20 mg/dL   Creatinine, Ser 3.30 (H) 0.44 - 1.00 mg/dL   Calcium 9.0 8.9 - 10.3 mg/dL   GFR calc non Af Amer 15 (L) >60 mL/min   GFR calc Af Amer 18 (L) >60 mL/min    Comment: (NOTE) The eGFR has been calculated using the CKD EPI equation. This calculation has not been validated in all clinical situations. eGFR's persistently <60 mL/min signify possible Chronic Kidney Disease.    Anion gap 12 5 - 15  Glucose, capillary     Status: Abnormal   Collection Time: 03/18/17  8:29 AM  Result Value Ref Range   Glucose-Capillary 167 (H) 65 - 99 mg/dL   Comment 1 Notify RN    Comment 2 Document in Chart   Glucose, capillary     Status: Abnormal   Collection Time: 03/18/17 12:28 PM  Result Value Ref Range   Glucose-Capillary 221 (H) 65 - 99 mg/dL   Comment 1 Notify RN    Comment 2 Document in Chart   Glucose, capillary     Status: Abnormal   Collection Time: 03/18/17  4:33 PM  Result Value Ref Range   Glucose-Capillary 304 (H) 65 - 99 mg/dL   Comment 1 Notify RN    Comment 2 Document in Chart    Dg Chest 2 View  Result Date: 03/17/2017 CLINICAL DATA:  R failure EXAM: CHEST  2 VIEW COMPARISON:  02/28/2017 FINDINGS: There is bilateral mild interstitial thickening. There is no focal parenchymal opacity. There is no pleural effusion or pneumothorax. There is stable cardiomegaly. There is elevation of the right diaphragm. The osseous structures are unremarkable. IMPRESSION: Cardiomegaly with mild pulmonary vascular congestion. Electronically Signed   By: Kathreen Devoid   On: 03/17/2017 08:54   Ct Head Code Stroke Wo Contrast  Result Date: 03/17/2017 CLINICAL DATA:  Code stroke.  Altered level of consciousness EXAM: CT HEAD WITHOUT CONTRAST TECHNIQUE: Contiguous axial images were obtained from the base of the skull through  the vertex without intravenous contrast. COMPARISON:  CT head 02/28/2017 FINDINGS: Brain: No evidence of acute infarction, hemorrhage, hydrocephalus, extra-axial collection or mass lesion/mass effect. Vascular: Negative for hyperdense vessel Skull: Negative Sinuses/Orbits: Negative Other: Round metal BB in the left frontal scalp. ASPECTS [facility] Stroke Program Early CT Score) - Ganglionic level infarction (caudate, lentiform nuclei, internal capsule, insula, M1-M3 cortex): 7 - Supraganglionic infarction (M4-M6 cortex): 3 Total score (0-10 with 10 being normal): 10 IMPRESSION: 1. Negative CT Head 2. ASPECTS is 10 3. BB in the left frontal scalp. This is likely safe for MRI but may cause artifact These results were called by telephone at the time of interpretation on 03/17/2017 at 11:28 am to Dr. Amie Portland , who verbally acknowledged these results. Electronically Signed   By: Franchot Gallo M.D.   On: 03/17/2017 11:29    Assessment:  1 AKI due to low BP due to diuresis with ARB & NSAIDs on board 2 CKD 3 3 Chronic NSAID consumer  Plan: 1 Stop NSAIDs and ARB until full and complete renal recovery 2 Explore different options with PCP for analgesic, instead of NSAID, given CKD 3 Allow BP to drift upward, volume expand  Breeley Bischof C 03/18/2017, 6:26 PM

## 2017-03-19 LAB — BASIC METABOLIC PANEL
Anion gap: 8 (ref 5–15)
BUN: 46 mg/dL — ABNORMAL HIGH (ref 6–20)
CO2: 30 mmol/L (ref 22–32)
Calcium: 9.4 mg/dL (ref 8.9–10.3)
Chloride: 99 mmol/L — ABNORMAL LOW (ref 101–111)
Creatinine, Ser: 1.73 mg/dL — ABNORMAL HIGH (ref 0.44–1.00)
GFR calc Af Amer: 39 mL/min — ABNORMAL LOW (ref >60)
GFR calc non Af Amer: 34 mL/min — ABNORMAL LOW (ref >60)
Glucose, Bld: 344 mg/dL — ABNORMAL HIGH (ref 65–99)
Potassium: 4.7 mmol/L (ref 3.5–5.1)
Sodium: 137 mmol/L (ref 135–145)

## 2017-03-19 LAB — GLUCOSE, CAPILLARY
Glucose-Capillary: 342 mg/dL — ABNORMAL HIGH (ref 65–99)
Glucose-Capillary: 335 mg/dL — ABNORMAL HIGH (ref 65–99)

## 2017-03-19 MED ORDER — ACETAMINOPHEN 325 MG PO TABS: 650.0000 mg | ORAL_TABLET | ORAL | 2 refills | Status: DC | PRN

## 2017-03-19 MED ORDER — LOSARTAN POTASSIUM 25 MG PO TABS: 12.5000 mg | ORAL_TABLET | Freq: Every day | ORAL | 1 refills | Status: DC

## 2017-03-19 MED ORDER — INSULIN GLULISINE 100 UNIT/ML IJ SOLN: 35.0000 [IU] | Freq: Every day | INTRAMUSCULAR | 11 refills | Status: DC

## 2017-03-19 MED ORDER — LOSARTAN POTASSIUM 25 MG PO TABS
12.5000 mg | ORAL_TABLET | Freq: Once | ORAL | Status: AC
  Administered 2017-03-19: 12.5 mg via ORAL
  Filled 2017-03-19: qty 1

## 2017-03-19 NOTE — Progress Notes (Signed)
Assessment:  1 AKI due to low BP due to diuresis with ARB & NSAIDs on board 2 CKD 3 3 Chronic NSAID consumer  Plan: 1) Will sign off   Subjective: Interval History: Better  Objective: Vital signs in last 24 hours: Temp:  [97.1 F (36.2 C)-98.5 F (36.9 C)] 97.8 F (36.6 C) (09/09 0719) Pulse Rate:  [73-86] 86 (09/09 0719) Resp:  [16-24] 17 (09/09 0719) BP: (86-138)/(43-110) 138/60 (09/09 0719) SpO2:  [92 %-99 %] 92 % (09/09 0749) Weight:  [134.1 kg (295 lb 10.2 oz)] 134.1 kg (295 lb 10.2 oz) (09/09 0448) Weight change: -1.843 kg (-4 lb 1 oz)  Intake/Output from previous day: 09/08 0701 - 09/09 0700 In: 1750 [P.O.:600; I.V.:1150] Out: 3550 [Urine:3550] Intake/Output this shift: Total I/O In: 360 [P.O.:360] Out: -   General appearance: alert and cooperative Resp: clear to auscultation bilaterally Chest wall: no tenderness Cardio: regular rate and rhythm, S1, S2 normal, no murmur, click, rub or gallop  Lab Results:  Recent Labs  03/17/17 1250  WBC 11.3*  HGB 10.9*  HCT 33.8*  PLT 148*   BMET:  Recent Labs  03/18/17 0241 03/19/17 0206  NA 133* 137  K 4.3 4.7  CL 94* 99*  CO2 27 30  GLUCOSE 131* 344*  BUN 69* 46*  CREATININE 3.30* 1.73*  CALCIUM 9.0 9.4   No results for input(s): PTH in the last 72 hours. Iron Studies: No results for input(s): IRON, TIBC, TRANSFERRIN, FERRITIN in the last 72 hours. Studies/Results: No results found.  Scheduled: . aspirin EC  81 mg Oral q morning - 10a  . atorvastatin  40 mg Oral Daily  . famotidine  20 mg Oral Daily  . heparin  5,000 Units Subcutaneous Q8H  . insulin aspart  0-9 Units Subcutaneous TID WC  . mometasone-formoterol  2 puff Inhalation BID  . sodium chloride flush  3 mL Intravenous Q12H  . Vitamin D (Ergocalciferol)  50,000 Units Oral Q Wed      LOS: 5 days   Leary Mcnulty C 03/19/2017,11:45 AM

## 2017-03-19 NOTE — Discharge Summary (Signed)
Physician Discharge Summary  Patient ID: [name] MRN: [id number] DOB/AGE: 1970/10/03 46 y.o.  Admit date: 03/14/2017 Discharge date: 03/19/2017  Admission Diagnoses:  Discharge Diagnoses:  Principal Problem:   Acute on chronic diastolic (congestive) heart failure (HCC) Active Problems:   Somnolence   Discharged Condition: stable  Hospital Course:   Patient was admitted to the hospital on 03/14/2017 with shortness of breath and leg swelling. She was treated for acute on chronic diastolic heart failure with IV diuresis. Through the hospital stay, her creatinine increased 1.3 up to 3.3. On 03/17/2017, she had an episode of acute change in her mental status. Neurology and pulmonary critical were consulted. Fortunately patient not have any stroke event, he was thought to be toxic metabolic encephalopathy in the setting of acute kidney injury secondary to borderline hypotension, diuresis, and her polypharmacy medications. Mental status improved on 09/0/2018. She did not require any intubation. Nephrology were also consulted given her acute kidney injury. Offending medications including antihypertensive agents, NSAIDs were stopped. Her creatinine also improved to 1.7. Her urine output improved remarkably. She was 6 L net negative on hospital discharge.  I have held her antihypertensive medications except 12.4 mg dose uncertain on discharge. I have also held her diuretics and will likely resume after seeing her outpatient. My office will contact the patient for transition of care call tomorrow on 03/20/2017. I will see her in the clinic on 03/22/2017 for close follow-up. Talked with her and her husband at length about salt restriction and medication compliance.  Consults: Neurology, nephrology, pulm & CC  Significant Diagnostic Studies: CT Head/ CTA head neck IMPRESSION: 1. Negative CT Head 2. ASPECTS is 10 3. BB in the left frontal scalp. This is likely safe for MRI but may cause  artifact  Given improvement in her mental status, it was felt that MRI head was not necessary.  Treatments: Diuresis, IV hydration following acute kidney injury  Discharge Exam: Blood pressure 138/60, pulse 86, temperature 97.8 F (36.6 C), resp. rate 17, height 5\' 6"  (1.676 m), weight 134.1 kg (295 lb 10.2 oz), SpO2 92 %.  Constitutional: She is oriented to person, place, and time. Morbidly obese Morbidly obese  HENT:  Head: Normocephalic and atraumatic.  Neck: JVD absent Cardiovascular: Regular rhythm.   No murmur heard. Respiratory: Effort normal. She has no wheezes. She has minimal right basal rales GI: Soft. Bowel sounds are normal. There is no tenderness.  Musculoskeletal: She exhibits trace b/l edema  Neurological: She is alert and oriented to person, place, and time.  Skin: Skin is warm and dry.   Disposition: Home  Allergies as of 03/19/2017      Reactions   Metoprolol Shortness Of Breath   Occurrence of shortness of breath after 3 days   Montelukast Shortness Of Breath   Morphine Sulfate Shortness Of Breath, Nausea And Vomiting   Swollen Throat - Able to tolerate dilaudid   Penicillins Hives, Shortness Of Breath   Swelling on throat Has patient had a PCN reaction causing immediate rash, facial/tongue/throat swelling, SOB or lightheadedness with hypotension: Yes Has patient had a PCN reaction causing severe rash involving mucus membranes or skin necrosis: No Has patient had a PCN reaction that required hospitalization Yes Has patient had a PCN reaction occurring within the last 10 years: No If all of the above answers are "NO", then may proceed with Cephalosporin use.   Prednisone Anaphylaxis   Diltiazem Swelling   Gabapentin Swelling      Medication List  STOP taking these medications   ALPRAZolam 1 MG tablet Commonly known as:  XANAX   ibuprofen 200 MG tablet Commonly known as:  ADVIL,MOTRIN   spironolactone 50 MG tablet Commonly known as:  ALDACTONE    torsemide 20 MG tablet Commonly known as:  DEMADEX   traMADol 50 MG tablet Commonly known as:  ULTRAM     TAKE these medications   acetaminophen 325 MG tablet Commonly known as:  TYLENOL Take 2 tablets (650 mg total) by mouth every 4 (four) hours as needed for headache or mild pain.   aspirin EC 81 MG tablet Take 81 mg by mouth every morning.   atorvastatin 40 MG tablet Commonly known as:  LIPITOR Take 40 mg by mouth daily.   budesonide-formoterol 80-4.5 MCG/ACT inhaler Commonly known as:  SYMBICORT Take 2 puffs first thing in am and then another 2 puffs about 12 hours later. What changed:  how much to take  how to take this  when to take this  additional instructions   busPIRone 30 MG tablet Commonly known as:  BUSPAR Take 30 mg by mouth 2 (two) times daily.   HUMULIN R 500 UNIT/ML injection Generic drug:  insulin regular human CONCENTRATED Inject 65-85 Units into the skin 3 (three) times daily with meals. 65 units in the morning 85 units in the afternoon 85 units in the evening   insulin glulisine 100 UNIT/ML injection Commonly known as:  APIDRA Inject 0.35 mLs (35 Units total) into the skin at bedtime. What changed:  when to take this   losartan 25 MG tablet Commonly known as:  COZAAR Take 0.5 tablets (12.5 mg total) by mouth daily. What changed:  how much to take   metFORMIN 1000 MG tablet Commonly known as:  GLUCOPHAGE Take 1,000 mg by mouth 2 (two) times daily with a meal.   ondansetron 4 MG tablet Commonly known as:  ZOFRAN Take 4 mg by mouth every 6 (six) hours as needed for nausea or vomiting.   OXYGEN Inhale 3 L into the lungs at bedtime.   pregabalin 300 MG capsule Commonly known as:  LYRICA Take 300 mg by mouth 2 (two) times daily.   albuterol 108 (90 Base) MCG/ACT inhaler Commonly known as:  PROVENTIL HFA;VENTOLIN HFA Inhale 2 puffs into the lungs every 4 (four) hours as needed for wheezing or shortness of breath.   PROVENTIL HFA 108  (90 Base) MCG/ACT inhaler Generic drug:  albuterol INHALE TWO (2) PUFFS EVERY FOUR HOURS ASNEEDED FOR SHORTNESS OF BREATH   QUEtiapine 400 MG 24 hr tablet Commonly known as:  SEROQUEL XR Take 800 mg by mouth at bedtime.   ranitidine 150 MG tablet Commonly known as:  ZANTAC Take 150 mg by mouth daily.   rOPINIRole 0.5 MG tablet Commonly known as:  REQUIP Take 0.5 mg by mouth at bedtime.   VICTOZA 18 MG/3ML Sopn Generic drug:  liraglutide Inject 1.8 mg into the skin every morning.   Vitamin D (Ergocalciferol) 50000 units Caps capsule Commonly known as:  DRISDOL Take 50,000 Units by mouth every 7 (seven) days. Takes each Wednesday before breakfast weekly.            Discharge Care Instructions        Start     Ordered   03/19/17 0000  acetaminophen (TYLENOL) 325 MG tablet  Every 4 hours PRN     03/19/17 0727   03/19/17 0000  losartan (COZAAR) 25 MG tablet  Daily     03/19/17  4356   03/19/17 0000  insulin glulisine (APIDRA) 100 UNIT/ML injection  Daily at bedtime     03/19/17 8616     Follow-up Information    Nigel Mormon, MD Follow up on 03/22/2017.   Specialty:  Cardiology Why:  3:30 PM Contact information: [place]  [address] [phone]           Signed: Nigel Mormon 03/19/2017, 11:01 AM   Nigel Mormon, MD [facility] [place] Pager: [phone] Office: [phone] If no answer Cell [phone]

## 2017-03-20 ENCOUNTER — Telehealth: Payer: Self-pay

## 2017-03-20 NOTE — Telephone Encounter (Signed)
Please thank him for the update, I have been following her since she was admitted. It looks like she has an appointment scheduled with cardiology this Wednesday, please ensure that she is aware of this appointment. Glad to hear she's feeling better.

## 2017-03-20 NOTE — Telephone Encounter (Signed)
[name] left v/m (DPR signed) pt was admitted to hospital 03/14/17 and was discharged on 03/19/17 with CHF. Pt is at home and doing pretty good; just wanted Nancy Fitz NP to know pt was at home.FYI to Nancy Fitz NP.

## 2017-03-21 DIAGNOSIS — E113513 Type 2 diabetes mellitus with proliferative diabetic retinopathy with macular edema, bilateral: Secondary | ICD-10-CM | POA: Diagnosis not present

## 2017-03-21 NOTE — Telephone Encounter (Signed)
Spoken and notified patient's husband of [name]'s comments. Patient's husband verbalized understanding.

## 2017-03-22 ENCOUNTER — Telehealth: Payer: Self-pay

## 2017-03-22 ENCOUNTER — Ambulatory Visit: Payer: Medicare Other | Admitting: Acute Care

## 2017-03-22 ENCOUNTER — Encounter: Payer: Self-pay | Admitting: Primary Care

## 2017-03-22 ENCOUNTER — Ambulatory Visit (INDEPENDENT_AMBULATORY_CARE_PROVIDER_SITE_OTHER): Payer: BLUE CROSS/BLUE SHIELD | Admitting: Primary Care

## 2017-03-22 VITALS — BP 116/68 | HR 86 | Temp 98.1°F | Ht 66.0 in | Wt 293.0 lb

## 2017-03-22 DIAGNOSIS — I5032 Chronic diastolic (congestive) heart failure: Secondary | ICD-10-CM

## 2017-03-22 DIAGNOSIS — I5033 Acute on chronic diastolic (congestive) heart failure: Secondary | ICD-10-CM

## 2017-03-22 DIAGNOSIS — Z794 Long term (current) use of insulin: Secondary | ICD-10-CM

## 2017-03-22 DIAGNOSIS — I2510 Atherosclerotic heart disease of native coronary artery without angina pectoris: Secondary | ICD-10-CM

## 2017-03-22 DIAGNOSIS — F319 Bipolar disorder, unspecified: Secondary | ICD-10-CM | POA: Diagnosis not present

## 2017-03-22 DIAGNOSIS — E118 Type 2 diabetes mellitus with unspecified complications: Secondary | ICD-10-CM

## 2017-03-22 DIAGNOSIS — E1165 Type 2 diabetes mellitus with hyperglycemia: Secondary | ICD-10-CM

## 2017-03-22 DIAGNOSIS — N179 Acute kidney failure, unspecified: Secondary | ICD-10-CM

## 2017-03-22 DIAGNOSIS — N182 Chronic kidney disease, stage 2 (mild): Secondary | ICD-10-CM | POA: Diagnosis not present

## 2017-03-22 DIAGNOSIS — IMO0002 Reserved for concepts with insufficient information to code with codable children: Secondary | ICD-10-CM

## 2017-03-22 DIAGNOSIS — F411 Generalized anxiety disorder: Secondary | ICD-10-CM | POA: Diagnosis not present

## 2017-03-22 LAB — BASIC METABOLIC PANEL
BUN: 14 mg/dL (ref 6–23)
CO2: 31 mEq/L (ref 19–32)
Calcium: 10 mg/dL (ref 8.4–10.5)
Chloride: 101 mEq/L (ref 96–112)
Creatinine, Ser: 1.01 mg/dL (ref 0.40–1.20)
GFR: 62.13 mL/min (ref >60.00)
Glucose, Bld: 132 mg/dL — ABNORMAL HIGH (ref 70–99)
Potassium: 4.4 mEq/L (ref 3.5–5.1)
Sodium: 141 mEq/L (ref 135–145)

## 2017-03-22 MED ORDER — TORSEMIDE 10 MG PO TABS
ORAL_TABLET | ORAL | 0 refills | Status: DC
Start: 2017-03-22 — End: 2017-04-04

## 2017-03-22 MED ORDER — HYDROXYZINE HCL 10 MG PO TABS: 10.0000 mg | ORAL_TABLET | Freq: Two times a day (BID) | ORAL | 0 refills | Status: DC | PRN

## 2017-03-22 NOTE — Patient Instructions (Addendum)
Start weighing yourself daily, at the same time of day, in the same clothing. The best time to weigh is after you urinate in the morning.  Report a weight gain of >2 pounds in 24 hours and >5 pounds within one week.  Restart torsemide tablets. We will start low at 10 mg. Take 1 tablet by mouth once daily. I will send a message to your cardiologist.  Your losartan was reduced to 12.5 mg which is 1/2 tablet.  Please call or message me when you get home and report any medications that appear on your list that you are NOT actually taking.   Do not take Ibuprofen, Tramadol, Xanax.  You may take hydroxyzine 10 mg up to twice daily as needed for anxiety/panic attacks. Please use this with caution.   Follow up with the diabetes specialist as scheduled next month. Follow up with the psychiatrist as scheduled next month. Follow up with the cardiologist as scheduled next month.  Continue to monitor your blood sugars and report readings at or above 200 on a consistent basis or below 80 on a consistent basis.  We will see you in December or sooner if needed. It was a pleasure to see you today!   Heart Failure Heart failure means your heart has trouble pumping blood. This makes it hard for your body to work well. Heart failure is usually a long-term (chronic) condition. You must take good care of yourself and follow your doctor's treatment plan. Follow these instructions at home:  Take your heart medicine as told by your doctor. ? Do not stop taking medicine unless your doctor tells you to. ? Do not skip any dose of medicine. ? Refill your medicines before they run out. ? Take other medicines only as told by your doctor or pharmacist.  Stay active if told by your doctor. The elderly and people with severe heart failure should talk with a doctor about physical activity.  Eat heart-healthy foods. Choose foods that are without trans fat and are low in saturated fat, cholesterol, and salt (sodium).  This includes fresh or frozen fruits and vegetables, fish, lean meats, fat-free or low-fat dairy foods, whole grains, and high-fiber foods. Lentils and dried peas and beans (legumes) are also good choices.  Limit salt if told by your doctor.  Cook in a healthy way. Roast, grill, broil, bake, poach, steam, or stir-fry foods.  Limit fluids as told by your doctor.  Weigh yourself every morning. Do this after you pee (urinate) and before you eat breakfast. Write down your weight to give to your doctor.  Take your blood pressure and write it down if your doctor tells you to.  Ask your doctor how to check your pulse. Check your pulse as told.  Lose weight if told by your doctor.  Stop smoking or chewing tobacco. Do not use gum or patches that help you quit without your doctor's approval.  Schedule and go to doctor visits as told.  Nonpregnant women should have no more than 1 drink a day. Men should have no more than 2 drinks a day. Talk to your doctor about drinking alcohol.  Stop illegal drug use.  Stay current with shots (immunizations).  Manage your health conditions as told by your doctor.  Learn to manage your stress.  Rest when you are tired.  If it is really hot outside: ? Avoid intense activities. ? Use air conditioning or fans, or get in a cooler place. ? Avoid caffeine and alcohol. ? Wear  loose-fitting, lightweight, and light-colored clothing.  If it is really cold outside: ? Avoid intense activities. ? Layer your clothing. ? Wear mittens or gloves, a hat, and a scarf when going outside. ? Avoid alcohol.  Learn about heart failure and get support as needed.  Get help to maintain or improve your quality of life and your ability to care for yourself as needed. Contact a doctor if:  You gain weight quickly.  You are more short of breath than usual.  You cannot do your normal activities.  You tire easily.  You cough more than normal, especially with  activity.  You have any or more puffiness (swelling) in areas such as your hands, feet, ankles, or belly (abdomen).  You cannot sleep because it is hard to breathe.  You feel like your heart is beating fast (palpitations).  You get dizzy or light-headed when you stand up. Get help right away if:  You have trouble breathing.  There is a change in mental status, such as becoming less alert or not being able to focus.  You have chest pain or discomfort.  You faint. This information is not intended to replace advice given to you by your health care provider. Make sure you discuss any questions you have with your health care provider. Document Released: 04/05/2008 Document Revised: 12/03/2015 Document Reviewed: 08/13/2012 Elsevier Interactive Patient Education  2017 [employer].

## 2017-03-22 NOTE — Assessment & Plan Note (Signed)
Scheduled to see psychiatry in October. Resume psychiatric medications except for Xanax given history of encephalopathy from sedating medications. She has experienced several panic attacks, likely secondary to withdrawal from benzos. Prescription for low-dose hydroxyzine sent to pharmacy to use as needed with precautions provided.

## 2017-03-22 NOTE — Assessment & Plan Note (Addendum)
Recent admission for 13 pound weight gain over 2 weeks. Heavily diuresed with 6 L net loss during hospital stay. Complications included acute kidney injury, unresponsiveness secondary to encephalopathy from sedating medications.  Exam today with evidence of fluid retention including lower extremity pitting edema and abdominal distention. Weights are questionable as she has not been weighing at home, our scale could be different than her home scale. Regardless, the difference is 10 pounds within 3 days which is concerning.  In light of the potential weight gain coupled with examination we'll restart torsemide 10 mg once daily. Discussed to carefully monitor blood pressure. BMP pending today to reevaluate kidney function.  Stressed the importance of daily weights that should occur at the same time of day and the same clothing. She understands to report a weight gain of greater than 2 pounds in 24 hours and/or greater than 5 pounds in one week.  Will notify cardiology of recent visit and that torsemide has been restarted. She will go home and notify us if she does not have a bottle of losartan, she doesn't think she's been taking.  Follow-up with cardiology as scheduled. Strict emergency department/return precautions provided. All Hospital labs,imaging, notes reviewed.

## 2017-03-22 NOTE — Telephone Encounter (Signed)
Pt left v/m; pt was seen earlier today and pt was to cb about med; pt is not taking losartan. Pt does not have losartan but if Gentry Fitz NP wants her to be on med please send in new rx. To [facility]. Per med list appears Dr Virgina Jock sent Losartan to [facility] on 03/19/17.Please advise.

## 2017-03-22 NOTE — Assessment & Plan Note (Signed)
Repeat BMP pending. Continue to hold nephrotoxic agents.

## 2017-03-22 NOTE — Assessment & Plan Note (Signed)
Scheduled to see endocrinology in early October. She will notify if blood sugars consistently run over 200 or less than 80. Continue current regimen.

## 2017-03-22 NOTE — Telephone Encounter (Signed)
I recommend she pick up the prescription for losartan and take one half tablet once daily as prescribed. This will help to protect her kidneys from the diabetes. Have her monitor her blood pressure as we want to avoid it becoming too low. Report readings below 100/60. Recent lab work from today shows that her kidney function has normalized which is good news.

## 2017-03-22 NOTE — Progress Notes (Signed)
Subjective:    Patient ID: [name], female    DOB: 09-Jul-1971, 46 y.o.   MRN: [id number]  HPI  [name] is a 46 year old female who presents today for hospital follow-up.  She was evaluated on 03/14/17 as a new patient, complaints of shortness of breath, abdominal distention with increased lower extremity edema and a weight gain of 13 pounds over the prior 2 weeks. Given history of congestive heart failure coupled with weight gain it was advised that she notify her cardiologist and go to the hospital.  Her cardiologist directly admitted her for aggressive diuresis secondary to acute on chronic CHF. During her stay in the hospital she was treated with IV Lasix with net output of 6 liters by discharge. She also underwent abdominal ultrasound given abdominal distention, this was negative for ascites. She was noted to be hyponatremic which was believed to be secondary to diuresis. Her creatinine jumped from 1.3-3.0, improved to 1.7 at discharge after diuresis as diuresis was stopped.   She was also consulted by neurology and critical care for sudden unresponsiveness. Stat CT of the head was negative. Unresponsiveness was suspected to be secondary to encephalopathy from sedating meds, hypotension, and decreased renal function. The nurse administered numerous sedating medications at one time prior to her unresponsiveness. She resumed baseline status within 24 hours. Given recent events all offending agents were held including NSAIDs, all antihypertensives, metformin, tramadol, Xanax, Seroquel.  She was discharged home on 03/19/17. Upon discharge creatinine improved 1.7, neurological status had been at baseline greater than 24 hours. Her cardiologist increased her losartan to 12.5 mg once daily. Her alprazolam, ibuprofen, spironolactone, torsemide, and tramadol were discontinued at discharge.   Since discharge home she's feeling better. She's not yet seen her cardiologist as they canceled her  appointment for today, she is rescheduled for early October. She's noticed weight gain starting to return, but is not weighing herself daily at home. She did weigh herself when she returned from the hospital and was 283 pounds on her scale. She is 293 pounds in our office today.   No other home weight since her initial home weight. She has noticed tightness to her abdomen and lower extremity edema again. Mild shortness of breath, but not as bad as it was during her last visit. She's been without her torsemide since her hospital stay. She has not called her cardiologist to notify them of the weight gain.  She's been without her alprazolam since Sunday and has been experiencing anxiety attacks twice daily. She's been taking alprazolam for years and had been taking 1/2 tablet once daily in the morning historically. She will see psychiatry in October. She denies chest pain. She's experiencing low back pain from abdominal distension in the front, has been using Tylenol 1000 mg twice daily without improvement. She doesn't believe she's been taking losartan as she does not recognize the name.  Review of Systems  Constitutional: Negative for fatigue.  Respiratory: Positive for shortness of breath. Negative for cough.   Cardiovascular: Positive for leg swelling. Negative for chest pain.  Musculoskeletal: Positive for back pain.  Skin: Negative for color change.  Neurological: Negative for weakness.  Psychiatric/Behavioral: The patient is nervous/anxious.        Past Medical History:  Diagnosis Date  . Anxiety    takes Prozac daily  . Anxiety   . Aortic valve calcification   . Asthma    Advair and Spirva daily  . Asthma   . Bipolar disorder ([place])   .  CAD (coronary artery disease)    a. LHC 11/2013 done for CP/fluid retention: mild disease in prox LAD, mild-mod disease in mRCA, EF 60% with normal LVEDP. b. Normal nuc 03/2016.  Marland Kitchen CHF (congestive heart failure) ([place])   . Chronic diastolic CHF  (congestive heart failure) ([place])   . CKD (chronic kidney disease), stage II   . COPD (chronic obstructive pulmonary disease) ([facility])    a. nocturnal O2.  Marland Kitchen COPD (chronic obstructive pulmonary disease) ([place])   . Coronary artery disease   . Diabetes mellitus   . Diabetes mellitus without complication ([place])   . Dyspnea   . Family history of adverse reaction to anesthesia    mom gets nauseated  . GERD (gastroesophageal reflux disease)    takes Pepcid daily  . History of blood clots    left leg 3-13yrs ago  . Hyperlipidemia   . Hypertension   . Hypertriglyceridemia   . Inguinal hernia, left 01/2015  . Muscle spasm   . Peripheral neuropathy   . RBBB   . Seizures ([place])   . Sinus tachycardia    a. persistent since 2009.  Marland Kitchen Smokers' cough ([place])   . Stroke [facility]) 1989   left sided weakness  . TIA (transient ischemic attack)   . Tobacco abuse      Social History   Social History  . Marital status: Married    Spouse name: N/A  . Number of children: N/A  . Years of education: N/A   Occupational History  . Not on file.   Social History Main Topics  . Smoking status: Current Every Day Smoker    Packs/day: 0.50    Years: 30.00    Types: Cigarettes  . Smokeless tobacco: Never Used  . Alcohol use Yes  . Drug use: No  . Sexual activity: No   Other Topics Concern  . Not on file   Social History Narrative   ** Merged History Encounter **       Lives in [place]   Married but lives with Boyfriend - not legally separated   Disabled - for BiPolar, Seizure disorder, diabetes   Formerly worked at [employer]          Past Surgical History:  Procedure Laterality Date  . HERNIA REPAIR    . INGUINAL HERNIA REPAIR Left 04/08/2015   Procedure: OPEN LEFT INGUINAL HERNIA REPAIR WITH MESH;  Surgeon: Ralene Ok, MD;  Location: [facility];  Service: General;  Laterality: Left;  . INSERTION OF MESH Left 04/08/2015   Procedure: INSERTION OF MESH;  Surgeon: Ralene Ok, MD;  Location: [facility];  Service: General;  Laterality: Left;  . LAPAROSCOPY     Endometriosis  . LEFT HEART CATHETERIZATION WITH CORONARY ANGIOGRAM N/A 12/05/2013   Procedure: LEFT HEART CATHETERIZATION WITH CORONARY ANGIOGRAM;  Surgeon: Jettie Booze, MD;  Location: [facility] CATH LAB;  Service: Cardiovascular;  Laterality: N/A;  . right kidney drained    . TEE WITHOUT CARDIOVERSION N/A 11/28/2013   Procedure: TRANSESOPHAGEAL ECHOCARDIOGRAM (TEE);  Surgeon: Thayer Headings, MD;  [facility]COPY;  Service: Cardiovascular;  Laterality: N/A;    Family History  Problem Relation Age of Onset  . Venous thrombosis Brother   . Other Brother        BRAIN TUMOR  . Asthma Father   . Diabetes Father   . Coronary artery disease Mother   . Hypertension Mother   . Diabetes Mother   . Asthma Sister   . Diabetes type  II Brother     Allergies  Allergen Reactions  . Metoprolol Shortness Of Breath    Occurrence of shortness of breath after 3 days  . Montelukast Shortness Of Breath  . Morphine Sulfate Shortness Of Breath and Nausea And Vomiting    Swollen Throat - Able to tolerate dilaudid  . Penicillins Hives and Shortness Of Breath    Swelling on throat Has patient had a PCN reaction causing immediate rash, facial/tongue/throat swelling, SOB or lightheadedness with hypotension: Yes Has patient had a PCN reaction causing severe rash involving mucus membranes or skin necrosis: No Has patient had a PCN reaction that required hospitalization Yes Has patient had a PCN reaction occurring within the last 10 years: No If all of the above answers are "NO", then may proceed with Cephalosporin use.   . Prednisone Anaphylaxis  . Diltiazem Swelling  . Gabapentin Swelling    Current Outpatient Prescriptions on File Prior to Visit  Medication Sig Dispense Refill  . acetaminophen (TYLENOL) 325 MG tablet Take 2 tablets (650 mg total) by mouth every 4 (four) hours as needed for headache or mild pain. 30 tablet 2  .  albuterol (PROVENTIL HFA;VENTOLIN HFA) 108 (90 Base) MCG/ACT inhaler Inhale 2 puffs into the lungs every 4 (four) hours as needed for wheezing or shortness of breath.    Marland Kitchen aspirin EC 81 MG tablet Take 81 mg by mouth every morning.    Marland Kitchen atorvastatin (LIPITOR) 40 MG tablet Take 40 mg by mouth daily.    . budesonide-formoterol (SYMBICORT) 80-4.5 MCG/ACT inhaler Take 2 puffs first thing in am and then another 2 puffs about 12 hours later. (Patient taking differently: Inhale 2 puffs into the lungs 2 (two) times daily. ) 1 Inhaler 11  . busPIRone (BUSPAR) 30 MG tablet Take 30 mg by mouth 2 (two) times daily.    . insulin glulisine (APIDRA) 100 UNIT/ML injection Inject 0.35 mLs (35 Units total) into the skin at bedtime. 10 mL 11  . insulin regular human CONCENTRATED (HUMULIN R) 500 UNIT/ML injection Inject 65-85 Units into the skin 3 (three) times daily with meals. 65 units in the morning 85 units in the afternoon 85 units in the evening    . liraglutide (VICTOZA) 18 MG/3ML SOPN Inject 1.8 mg into the skin every morning.    Marland Kitchen losartan (COZAAR) 25 MG tablet Take 0.5 tablets (12.5 mg total) by mouth daily. 30 tablet 1  . metFORMIN (GLUCOPHAGE) 1000 MG tablet Take 1,000 mg by mouth 2 (two) times daily with a meal.    . ondansetron (ZOFRAN) 4 MG tablet Take 4 mg by mouth every 6 (six) hours as needed for nausea or vomiting.    . OXYGEN Inhale 3 L into the lungs at bedtime.     . pregabalin (LYRICA) 300 MG capsule Take 300 mg by mouth 2 (two) times daily.     Marland Kitchen PROVENTIL HFA 108 (90 Base) MCG/ACT inhaler INHALE TWO (2) PUFFS EVERY FOUR HOURS ASNEEDED FOR SHORTNESS OF BREATH 6.7 g 2  . QUEtiapine (SEROQUEL XR) 400 MG 24 hr tablet Take 800 mg by mouth at bedtime.    . ranitidine (ZANTAC) 150 MG tablet Take 150 mg by mouth daily.     Marland Kitchen rOPINIRole (REQUIP) 0.5 MG tablet Take 0.5 mg by mouth at bedtime.    . Vitamin D, Ergocalciferol, (DRISDOL) 50000 units CAPS capsule Take 50,000 Units by mouth every 7 (seven)  days. Takes each Wednesday before breakfast weekly.      No  current facility-administered medications on file prior to visit.     BP 116/68   Pulse 86   Temp 98.1 F (36.7 C) (Oral)   Ht 5\' 6"  (1.676 m)   Wt 293 lb (132.9 kg)   SpO2 95%   BMI 47.29 kg/m    Objective:   Physical Exam  Constitutional: She is oriented to person, place, and time. She appears well-nourished.  Neck: Neck supple.  Cardiovascular: Normal rate and regular rhythm.   Trace to 1+ pitting edema to bilateral lower extremities  Pulmonary/Chest: Effort normal and breath sounds normal. She has no rales.  Abdominal: Soft. Bowel sounds are normal. She exhibits distension. There is no tenderness.  Neurological: She is alert and oriented to person, place, and time.  Skin: Skin is warm and dry.  Psychiatric: She has a normal mood and affect.          Assessment & Plan:

## 2017-03-23 NOTE — Telephone Encounter (Signed)
Spoken and notified patient of [name]'s comments. Patient verbalized understanding. 

## 2017-03-24 MED ORDER — LOSARTAN POTASSIUM 25 MG PO TABS: 12.5000 mg | ORAL_TABLET | Freq: Every day | ORAL | 0 refills | Status: DC

## 2017-03-24 NOTE — Telephone Encounter (Signed)
Message left for patient to return my call.  

## 2017-03-24 NOTE — Telephone Encounter (Signed)
Spoken to patient and notified her that we re-sent her medication to the pharmacy.

## 2017-03-24 NOTE — Addendum Note (Signed)
Addended by: Jacqualin Combes on: 03/24/2017 11:01 AM   Modules accepted: Orders

## 2017-03-24 NOTE — Telephone Encounter (Signed)
Pt left v/m that [facility] did not have losartan rx; pt wants to know if she misunderstood call from 03/23/17. Pt request cb.

## 2017-03-27 ENCOUNTER — Telehealth: Payer: Self-pay

## 2017-03-27 LAB — BENZODIAZEPINES,MS,WB/SP RFX
7-Aminoclonazepam: NEGATIVE ng/mL
Alprazolam: 17.9 ng/mL
Benzodiazepines Confirm: POSITIVE
Chlordiazepoxide: NEGATIVE ng/mL
Clonazepam: NEGATIVE ng/mL
Desalkylflurazepam: NEGATIVE ng/mL
Desmethylchlordiazepoxide: NEGATIVE ng/mL
Desmethyldiazepam: NEGATIVE ng/mL
Diazepam: NEGATIVE ng/mL
Flurazepam: NEGATIVE ng/mL
Lorazepam: NEGATIVE ng/mL
Midazolam: NEGATIVE ng/mL
Oxazepam: NEGATIVE ng/mL
Temazepam: NEGATIVE ng/mL
Triazolam: NEGATIVE ng/mL

## 2017-03-27 LAB — DRUG SCREEN 10 W/CONF, SERUM
Amphetamines, IA: NEGATIVE ng/mL
Barbiturates, IA: NEGATIVE ug/mL
Benzodiazepines, IA: POSITIVE ng/mL
Cocaine & Metabolite, IA: NEGATIVE ng/mL
Methadone, IA: NEGATIVE ng/mL
Opiates, IA: NEGATIVE ng/mL
Oxycodones, IA: NEGATIVE ng/mL
Phencyclidine, IA: NEGATIVE ng/mL
Propoxyphene, IA: NEGATIVE ng/mL
THC(Marijuana) Metabolite, IA: NEGATIVE ng/mL

## 2017-03-27 NOTE — Telephone Encounter (Signed)
Have her increase Torsemide to 20 mg for 2 days, if no improvement then increase to 30 mg. Have her closely monitor her blood pressure for drops, if she experiences drops then have her stop the Torsemide. Also recommend she call her cardiologist to notify.

## 2017-03-27 NOTE — Telephone Encounter (Signed)
Pt last seen 03/22/17.  On 03/26/17 pts wt 291; this morning pt wt 295.Swelling in legs,feet and abd worse than when seen 03/22/17. Pt taking Torsemide 10 mg daily. Pt wants to know if should increase Torsemide. Pt states "feet look like butterballs." after keep legs up all night swelling goes down. No CP or SOB. Pt has not taken BP. [facility].

## 2017-03-27 NOTE — Telephone Encounter (Signed)
Spoken and notified patient of [name]'s comments. Patient verbalized understanding. 

## 2017-03-29 ENCOUNTER — Ambulatory Visit (INDEPENDENT_AMBULATORY_CARE_PROVIDER_SITE_OTHER)
Admission: RE | Admit: 2017-03-29 | Discharge: 2017-03-29 | Disposition: A | Discharge status: Home / Self Care | Payer: BLUE CROSS/BLUE SHIELD | Source: Ambulatory Visit | Attending: Acute Care | Admitting: Acute Care

## 2017-03-29 ENCOUNTER — Encounter: Payer: Self-pay | Admitting: Acute Care

## 2017-03-29 ENCOUNTER — Ambulatory Visit (INDEPENDENT_AMBULATORY_CARE_PROVIDER_SITE_OTHER): Payer: BLUE CROSS/BLUE SHIELD | Admitting: Acute Care

## 2017-03-29 VITALS — BP 102/52 | HR 110 | Ht 66.0 in | Wt 296.0 lb

## 2017-03-29 DIAGNOSIS — R0602 Shortness of breath: Secondary | ICD-10-CM

## 2017-03-29 DIAGNOSIS — J9611 Chronic respiratory failure with hypoxia: Secondary | ICD-10-CM

## 2017-03-29 DIAGNOSIS — G4734 Idiopathic sleep related nonobstructive alveolar hypoventilation: Secondary | ICD-10-CM

## 2017-03-29 DIAGNOSIS — J449 Chronic obstructive pulmonary disease, unspecified: Secondary | ICD-10-CM | POA: Diagnosis not present

## 2017-03-29 DIAGNOSIS — I2510 Atherosclerotic heart disease of native coronary artery without angina pectoris: Secondary | ICD-10-CM

## 2017-03-29 HISTORY — DX: Idiopathic sleep related nonobstructive alveolar hypoventilation: G47.34

## 2017-03-29 NOTE — Assessment & Plan Note (Signed)
Continue wearing oxygen at 3 L [place] at night
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
## 2017-03-29 NOTE — Assessment & Plan Note (Signed)
Pt. Is scheduled for a formal sleep study 04/07/2017.

## 2017-03-29 NOTE — Progress Notes (Signed)
Chart and office note reviewed in detail  > agree with a/p as outlined    

## 2017-03-29 NOTE — Patient Instructions (Addendum)
CXR today We will walk you today to see if you qualify for home oxygen during the day.>> You did not drop your oxygen while walking today. CXR today We will call you with results Continue your Symbicort 2 puffs twice daily. Rinse mouth after use. Use Albuterol as needed for breakthrough shortness of breath. Please make an appointment with your PCP regarding your blood pressure being a bit low. Referral to Pulmonary rehab ( Dr. Melvyn Novas). Follow up with Dr. Melvyn Novas as needed. Please contact office for sooner follow up if symptoms do not improve or worsen or seek emergency care    Suspect this is multi factorial>> she is very deconditioned.

## 2017-03-29 NOTE — Assessment & Plan Note (Signed)
Shortness of breath with exertion Did not desaturate in the office today with ambulatory oximetry Very deconditioned Plan: We will walk you today to see if you qualify for home oxygen during the day.>> You did not drop your oxygen while walking today. CXR today We will call you with results Continue your Symbicort 2 puffs twice daily. Rinse mouth after use. Use Albuterol as needed for breakthrough shortness of breath. Please make an appointment with your PCP regarding your blood pressure being a bit low. Referral to Pulmonary rehab ( Dr. Melvyn Novas). Follow up with Dr. Melvyn Novas as needed. Please contact office for sooner follow up if symptoms do not improve or worsen or seek emergency care

## 2017-03-30 NOTE — Addendum Note (Signed)
Addended by: Jannette Spanner on: 03/30/2017 08:53 AM   Modules accepted: Orders

## 2017-03-31 ENCOUNTER — Encounter: Payer: BLUE CROSS/BLUE SHIELD | Admitting: Adult Health

## 2017-03-31 ENCOUNTER — Telehealth: Payer: Self-pay | Admitting: Internal Medicine

## 2017-03-31 DIAGNOSIS — J9611 Chronic respiratory failure with hypoxia: Secondary | ICD-10-CM

## 2017-03-31 DIAGNOSIS — J449 Chronic obstructive pulmonary disease, unspecified: Secondary | ICD-10-CM

## 2017-03-31 NOTE — Telephone Encounter (Signed)
Order done over with one diagnosis. FYI PCC's

## 2017-03-31 NOTE — Telephone Encounter (Signed)
Order has been sent to there [facility]

## 2017-04-03 ENCOUNTER — Telehealth: Payer: Self-pay

## 2017-04-03 DIAGNOSIS — I5032 Chronic diastolic (congestive) heart failure: Secondary | ICD-10-CM

## 2017-04-03 NOTE — Telephone Encounter (Signed)
Spoken to patient. She stated that she never took the whole tablet. She has been taking 1/2 tablet as she was told to but her blood pressure has been dropping.

## 2017-04-03 NOTE — Telephone Encounter (Signed)
Please notify patient to hold losartan for now, monitor blood pressure and update [place] if her blood pressure continues to drop. How much of the torsemide is she taking daily? How are her weights?

## 2017-04-03 NOTE — Telephone Encounter (Signed)
Please notify patient that she's supposed to be taking 12.5 mg of losartan which is 1/2 tablet of the 25 mg tablet. Has she been taking the whole or half tablet? If taking the whole tablet then reduce to 1/2 tablet once daily, monitor BP readings and update [place] in 1 week.

## 2017-04-03 NOTE — Telephone Encounter (Signed)
Pt left v/m; pt was taking losartan 25 mg; pts BP is dropping; last BP readings are 102/50 and 109/50. Pt thinks BP is too low and pt stopped taking losartan 03/29/17. Pt request cb with further instructions. Pt last seen 03/22/17.

## 2017-04-04 MED ORDER — TORSEMIDE 10 MG PO TABS: ORAL_TABLET | ORAL | 0 refills | Status: DC

## 2017-04-04 NOTE — Telephone Encounter (Signed)
Message left for patient to return my call.  

## 2017-04-04 NOTE — Telephone Encounter (Signed)
Patient returned Chan's call.  Please call patient back at [phone].

## 2017-04-04 NOTE — Telephone Encounter (Signed)
Spoken and notified patient of [name]'s comments. Patient verbalized understanding. 

## 2017-04-04 NOTE — Telephone Encounter (Signed)
Spoken and notified patient of [name]'s comments. Patient verbalized understanding.  Patient stated that she take 3 tablets (30 mg tablets) by mouth once daily. Her weight is about 293 to 295.  Also patient would like a refill of the torsemide to [facility].

## 2017-04-04 NOTE — Telephone Encounter (Signed)
Refill sent to pharmacy for torsemide. Report weight gain of >2 pounds in 24 hours and/or weight gain of 5 pounds in 1 week. Please have her follow up with her cardiologist as scheduled. Monitor BP.

## 2017-04-05 ENCOUNTER — Encounter (HOSPITAL_BASED_OUTPATIENT_CLINIC_OR_DEPARTMENT_OTHER): Payer: Medicare Other

## 2017-04-10 ENCOUNTER — Other Ambulatory Visit: Payer: Self-pay

## 2017-04-10 ENCOUNTER — Telehealth: Payer: Self-pay | Admitting: Primary Care

## 2017-04-10 DIAGNOSIS — I10 Essential (primary) hypertension: Secondary | ICD-10-CM | POA: Diagnosis not present

## 2017-04-10 DIAGNOSIS — I5032 Chronic diastolic (congestive) heart failure: Secondary | ICD-10-CM | POA: Diagnosis not present

## 2017-04-10 DIAGNOSIS — F17200 Nicotine dependence, unspecified, uncomplicated: Secondary | ICD-10-CM | POA: Diagnosis not present

## 2017-04-10 NOTE — Patient Outreach (Signed)
[facility]) Care Management  04/10/2017  Telia Callanan 1970/12/10 [id number]   EMMI: Heart failure Referral date: 04/10/17 Referral source: EMMI heart failure red alert Referral reason: New/ worsening problems: YES Day #18  Telephone call to patient regarding EMMI heart failure red alert. HIPAA verified.  Discussed EMMI heart failure program with patient. Patient states she is having problems with constipation. Patient states she has been taking two laxative medications.  Patient states she had a large bowel movement today after not having had one for a couple of weeks. Patient described her bowel movement today as large and very black. Patient reports her bowel movement was loose and black like tar.  Patient denies any signs of bright red blood in her stool.  RNCM advised patient to contact her primary MD office today to notify them of the stools and constipation.  Patient verbalized understanding.  Patient states she is having some shortness of breath.  Patient reports she has an appointment to see her cardiologist today. Patient reports she is checking her weight daily. Patient reports today's weight is 292 lbs. RNCM reviewed heart failure action plan/ zones with patient. RNCM advised patient to contact her doctor if she has a weight gain of 3 lbs overnight and / or 5 lbs in a week. RNCM requested to send patient EMMI education material on heart failure and low sodium diet. Patient verbally agreed.  RNCM advised patient to notify MD of any changes in condition prior to scheduled appointment. RNCM verified patient aware of 911 services for urgent/ emergent needs.  ASSESSMENT; per patients medical record. Admit date: 03/14/2017 Discharge date: 03/19/2017 Discharge Diagnoses:  Principal Problem:   Acute on chronic diastolic (congestive) heart failure (HCC) Active Problems:   Somnolence  New medical problem of constipation and black, loose stools.  RNCM referred patient to  primary MD.    PLAN: RNCM will follow up with patient within 3 business days.  RNCM will send patient EMMI education material on heart failure and low sodium diet. RNCM will send patient an Advance directive as requested.   Quinn Plowman RN,BSN,CCM [facility] Telephonic  [phone]

## 2017-04-10 NOTE — Telephone Encounter (Signed)
Please notify patient that she can try Miralax once daily in 8 ounces of water. If she's already tried that then I'm happy to discuss other options in the office. Please schedule if needed.

## 2017-04-10 NOTE — Telephone Encounter (Signed)
Spoken to patient and she stated that she wanted to know if Anda Kraft can prescribed something for her constipation.   Patient has tried everything over the counter from stool softens, bisacodyl, magnesium citrate, fiber, and probiotic.

## 2017-04-10 NOTE — Telephone Encounter (Signed)
Pt returned your call  [phone]

## 2017-04-11 ENCOUNTER — Other Ambulatory Visit: Payer: Self-pay

## 2017-04-11 NOTE — Telephone Encounter (Signed)
Ms Nyoka Cowden nurse case mgr with [facility] left v/m; pt d/c from hospital on 03/19/17 and last f/u with Allie Bossier NP was 03/22/17; pt having constipation since discharged from hospital; pt has tried miralax and polyethyl med. Pt had not had BM in one week until 04/10/17 which was a large black BM. Pt is not on iron med. Pt is waiting on cb from pts call on 04/10/17. Ms Nyoka Cowden request cb to pt.

## 2017-04-11 NOTE — Telephone Encounter (Signed)
Spoken to patient and schedule an office visit on 04/14/2017

## 2017-04-11 NOTE — Patient Outreach (Signed)
[place] [facility]) Care Management  04/11/2017  Zilphia Lafavor 07-13-1970 [id number]    EMMI: Heart failure Referral date: 04/10/17 Referral source: EMMI heart failure red alert Referral reason: New/ worsening problems: YES Day #18   Telephone follow up call to patient. HIPAA verified. Patient reports she contacted her primary MD office on yesterday and left a message with the nurse regarding her bowel symptoms. Patient states she has not heard back from the office.  RNCM contacted patients primary MD office to notify them of patients bowel symptoms/ concerns. Requested patient receive call back from office.  RNCM instructed patient to contact her primary MD office again if she does not hear back.  Update: RNCM notified patient it not eligible for [facility] care management services.    PLAN: RNCM will refer patient to care management assistant to close due to patient not being eligible for [facility] care management services.   Quinn Plowman RN,BSN,CCM [facility] Telephonic  [phone]

## 2017-04-13 ENCOUNTER — Institutional Professional Consult (permissible substitution): Payer: Medicare Other | Admitting: Endocrinology

## 2017-04-14 ENCOUNTER — Other Ambulatory Visit: Payer: Self-pay | Admitting: Primary Care

## 2017-04-14 ENCOUNTER — Ambulatory Visit (INDEPENDENT_AMBULATORY_CARE_PROVIDER_SITE_OTHER)
Admission: RE | Admit: 2017-04-14 | Discharge: 2017-04-14 | Disposition: A | Discharge status: Home / Self Care | Payer: BLUE CROSS/BLUE SHIELD | Source: Ambulatory Visit | Attending: Primary Care | Admitting: Primary Care

## 2017-04-14 ENCOUNTER — Ambulatory Visit (INDEPENDENT_AMBULATORY_CARE_PROVIDER_SITE_OTHER): Payer: BLUE CROSS/BLUE SHIELD | Admitting: Primary Care

## 2017-04-14 ENCOUNTER — Encounter: Payer: Self-pay | Admitting: Primary Care

## 2017-04-14 VITALS — BP 136/74 | HR 82 | Temp 98.2°F | Wt 296.0 lb

## 2017-04-14 DIAGNOSIS — K5900 Constipation, unspecified: Secondary | ICD-10-CM

## 2017-04-14 DIAGNOSIS — Z720 Tobacco use: Secondary | ICD-10-CM

## 2017-04-14 DIAGNOSIS — I2510 Atherosclerotic heart disease of native coronary artery without angina pectoris: Secondary | ICD-10-CM

## 2017-04-14 DIAGNOSIS — G629 Polyneuropathy, unspecified: Secondary | ICD-10-CM | POA: Diagnosis not present

## 2017-04-14 DIAGNOSIS — K625 Hemorrhage of anus and rectum: Secondary | ICD-10-CM

## 2017-04-14 DIAGNOSIS — K5909 Other constipation: Secondary | ICD-10-CM

## 2017-04-14 LAB — CBC
HCT: 34.8 % — ABNORMAL LOW (ref 36.0–46.0)
Hemoglobin: 11.1 g/dL — ABNORMAL LOW (ref 12.0–15.0)
MCHC: 32 g/dL (ref 30.0–36.0)
MCV: 86.1 fl (ref 78.0–100.0)
Platelets: 178 10*3/uL (ref 150.0–400.0)
RBC: 4.04 Mil/uL (ref 3.87–5.11)
RDW: 18.9 % — ABNORMAL HIGH (ref 11.5–15.5)
WBC: 12.7 10*3/uL — ABNORMAL HIGH (ref 4.0–10.5)

## 2017-04-14 MED ORDER — VARENICLINE TARTRATE 0.5 MG X 11 & 1 MG X 42 PO MISC: ORAL | 0 refills | Status: DC

## 2017-04-14 MED ORDER — PREGABALIN 300 MG PO CAPS: 300.0000 mg | ORAL_CAPSULE | Freq: Two times a day (BID) | ORAL | 0 refills | Status: DC

## 2017-04-14 MED ORDER — VARENICLINE TARTRATE 1 MG PO TABS: 1.0000 mg | ORAL_TABLET | Freq: Two times a day (BID) | ORAL | 1 refills | Status: DC

## 2017-04-14 NOTE — Assessment & Plan Note (Addendum)
Chronic and intermittent. Check abdominal films today to rule out any obstruction. Will have her continue Miralax, add Colace daily. Discussed to limit tea, increase water. Increase fiber.  Positive hemoccult stool card, check CBC. Consider GI referral for colonoscopy/evaluation of rectal bleeding and chronic constipation given lack of external hemorrhoids. Pepto bismol could be contributing to color and consistency of stools but given her co-morbidities and history of tobacco abuse she will need further evaluation.

## 2017-04-14 NOTE — Assessment & Plan Note (Signed)
Ready to quit, did well on Chantix in the past.  Trial of chantix, start with Starting month pack then move to continuing month pack. Common side effects including rare risk of suicide ideation was discussed with the patient today.  Patient is instructed to go directly to the ED if this occurs.  We discussed that patient can continue to smoke for 1 week after starting chantix, but then must discontinue cigarettes. Discussed that she must choose a quit date prior to starting Chantix. 5 minutes spent with patient today on tobacco cessation counseling.

## 2017-04-14 NOTE — Patient Instructions (Addendum)
Complete lab work prior to leaving today. I will notify you of your results once received.   Start Chantix Starting Month Pack for tobacco cessation.  You must choose a stop date before you start this medication. You can't smoke after week 2 of the Chantix medication.  When you've finished the Starting Month Pack, start the Continuing Month Pack.  Continue Miralax daily. Start taking docusate sodium (stool softener) once daily.   Limit sweet tea, drink water instead.  Please call me if you have any problems.  It was a pleasure to see you today!  [ Constipation, Adult Constipation is when a person:  Poops (has a bowel movement) fewer times in a week than normal.  Has a hard time pooping.  Has poop that is dry, hard, or bigger than normal.  Follow these instructions at home: Eating and drinking   Eat foods that have a lot of fiber, such as: ? Fresh fruits and vegetables. ? Whole grains. ? Beans.  Eat less of foods that are high in fat, low in fiber, or overly processed, such as: ? [place] fries. ? Hamburgers. ? Cookies. ? Candy. ? Soda.  Drink enough fluid to keep your pee (urine) clear or pale yellow. General instructions  Exercise regularly or as told by your doctor.  Go to the restroom when you feel like you need to poop. Do not hold it in.  Take over-the-counter and prescription medicines only as told by your doctor. These include any fiber supplements.  Do pelvic floor retraining exercises, such as: ? Doing deep breathing while relaxing your lower belly (abdomen). ? Relaxing your pelvic floor while pooping.  Watch your condition for any changes.  Keep all follow-up visits as told by your doctor. This is important. Contact a doctor if:  You have pain that gets worse.  You have a fever.  You have not pooped for 4 days.  You throw up (vomit).  You are not hungry.  You lose weight.  You are bleeding from the anus.  You have thin, pencil-like poop  (stool). Get help right away if:  You have a fever, and your symptoms suddenly get worse.  You leak poop or have blood in your poop.  Your belly feels hard or bigger than normal (is bloated).  You have very bad belly pain.  You feel dizzy or you faint. This information is not intended to replace advice given to you by your health care provider. Make sure you discuss any questions you have with your health care provider. Document Released: 12/14/2007 Document Revised: 01/15/2016 Document Reviewed: 12/16/2015 Elsevier Interactive Patient Education  2017 [employer].

## 2017-04-14 NOTE — Progress Notes (Signed)
Subjective:    Patient ID: [name], female    DOB: 06-15-1971, 46 y.o.   MRN: [id number]  HPI  [name] is a 46 year old female who presents today with a chief complaint of constipation. She's also like to talk about smoking cessation.  1) Constipation: History of constipation that began one year ago that has been intermittent. She's noticed a return in her constipation with black and tarry stools intermittently for the past 2 months. All bowel movements are this way. She has a bowel movement once to twice weekly on average with her longest duration without a bowel movement being 10 days.  She's been using the Womens Laxative, Miralax, Pepto Bismol, and suppositories for the past 2 months without much improvement. She endorses a diet high in fiber and drinks 2 bottles of water daily, also drinking sweet tea. She denies bright red blood in her stools. She has noticed some hemorrhoids after straining.  2) Tobacco Abuse: Smoker for the past 30 years. Currently smokes 2 PPD. She is ready to quit smoking. She's tried Wellbutrin the past which hasn't helped. She's also tried OTC nicotine patches and gum without improvement. She was once on Chantix in her 20's which helped her to quit smoking.   Review of Systems  Constitutional: Negative for fever.  Gastrointestinal: Positive for abdominal distention and constipation. Negative for abdominal pain.       Dark, tarry stools       Past Medical History:  Diagnosis Date  . Anxiety    takes Prozac daily  . Anxiety   . Aortic valve calcification   . Asthma    Advair and Spirva daily  . Asthma   . Bipolar disorder ([place])   . CAD (coronary artery disease)    a. LHC 11/2013 done for CP/fluid retention: mild disease in prox LAD, mild-mod disease in mRCA, EF 60% with normal LVEDP. b. Normal nuc 03/2016.  Marland Kitchen CHF (congestive heart failure) ([place])   . Chronic diastolic CHF (congestive heart failure) ([place])   . CKD (chronic kidney disease), stage II    . COPD (chronic obstructive pulmonary disease) (HCC)    a. nocturnal O2.  Marland Kitchen COPD (chronic obstructive pulmonary disease) ([place])   . Coronary artery disease   . Diabetes mellitus   . Diabetes mellitus without complication ([place])   . Dyspnea   . Family history of adverse reaction to anesthesia    mom gets nauseated  . GERD (gastroesophageal reflux disease)    takes Pepcid daily  . History of blood clots    left leg 3-20yrs ago  . Hyperlipidemia   . Hypertension   . Hypertriglyceridemia   . Inguinal hernia, left 01/2015  . Muscle spasm   . Peripheral neuropathy   . RBBB   . Seizures ([place])   . Sinus tachycardia    a. persistent since 2009.  Marland Kitchen Smokers' cough ([place])   . Stroke [facility]) 1989   left sided weakness  . TIA (transient ischemic attack)   . Tobacco abuse      Social History   Social History  . Marital status: Married    Spouse name: N/A  . Number of children: N/A  . Years of education: N/A   Occupational History  . Not on file.   Social History Main Topics  . Smoking status: Current Some Day Smoker    Packs/day: 0.50    Years: 30.00    Types: Cigarettes    Last attempt to quit: 01/08/2017  .  Smokeless tobacco: Never Used  . Alcohol use Yes  . Drug use: No  . Sexual activity: No   Other Topics Concern  . Not on file   Social History Narrative   ** Merged History Encounter **       Lives in [place]   Married but lives with Boyfriend - not legally separated   Disabled - for BiPolar, Seizure disorder, diabetes   Formerly worked at [employer]          Past Surgical History:  Procedure Laterality Date  . HERNIA REPAIR    . INGUINAL HERNIA REPAIR Left 04/08/2015   Procedure: OPEN LEFT INGUINAL HERNIA REPAIR WITH MESH;  Surgeon: Ralene Ok, MD;  Location: [place];  Service: General;  Laterality: Left;  . INSERTION OF MESH Left 04/08/2015   Procedure: INSERTION OF MESH;  Surgeon: Ralene Ok, MD;  Location: [place];  Service: General;  Laterality: Left;  .  LAPAROSCOPY     Endometriosis  . LEFT HEART CATHETERIZATION WITH CORONARY ANGIOGRAM N/A 12/05/2013   Procedure: LEFT HEART CATHETERIZATION WITH CORONARY ANGIOGRAM;  Surgeon: Jettie Booze, MD;  Location[facility] CATH LAB;  Service: Cardiovascular;  Laterality: N/A;  . right kidney drained    . TEE WITHOUT CARDIOVERSION N/A 11/28/2013   Procedure: TRANSESOPHAGEAL ECHOCARDIOGRAM (TEE);  Surgeon: Thayer Headings, MD;  Location: [facility] ENDOSCOPY;  Service: Cardiovascular;  Laterality: N/A;    Family History  Problem Relation Age of Onset  . Venous thrombosis Brother   . Other Brother        BRAIN TUMOR  . Asthma Father   . Diabetes Father   . Coronary artery disease Mother   . Hypertension Mother   . Diabetes Mother   . Asthma Sister   . Diabetes type II Brother     Allergies  Allergen Reactions  . Metoprolol Shortness Of Breath    Occurrence of shortness of breath after 3 days  . Montelukast Shortness Of Breath  . Morphine Sulfate Shortness Of Breath and Nausea And Vomiting    Swollen Throat - Able to tolerate dilaudid  . Penicillins Hives and Shortness Of Breath    Swelling on throat Has patient had a PCN reaction causing immediate rash, facial/tongue/throat swelling, SOB or lightheadedness with hypotension: Yes Has patient had a PCN reaction causing severe rash involving mucus membranes or skin necrosis: No Has patient had a PCN reaction that required hospitalization Yes Has patient had a PCN reaction occurring within the last 10 years: No If all of the above answers are "NO", then may proceed with Cephalosporin use.   . Prednisone Anaphylaxis  . Diltiazem Swelling  . Gabapentin Swelling    Current Outpatient Prescriptions on File Prior to Visit  Medication Sig Dispense Refill  . acetaminophen (TYLENOL) 325 MG tablet Take 2 tablets (650 mg total) by mouth every 4 (four) hours as needed for headache or mild pain. 30 tablet 2  . albuterol (PROVENTIL HFA;VENTOLIN HFA) 108 (90  Base) MCG/ACT inhaler Inhale 2 puffs into the lungs every 4 (four) hours as needed for wheezing or shortness of breath.    Marland Kitchen aspirin EC 81 MG tablet Take 81 mg by mouth every morning.    Marland Kitchen atorvastatin (LIPITOR) 40 MG tablet Take 40 mg by mouth daily.    . budesonide-formoterol (SYMBICORT) 80-4.5 MCG/ACT inhaler Take 2 puffs first thing in am and then another 2 puffs about 12 hours later. (Patient taking differently: Inhale 2 puffs into the lungs 2 (two) times daily. )  1 Inhaler 11  . busPIRone (BUSPAR) 30 MG tablet Take 30 mg by mouth 2 (two) times daily.    . hydrOXYzine (ATARAX/VISTARIL) 10 MG tablet Take 1 tablet (10 mg total) by mouth 2 (two) times daily as needed for anxiety. 30 tablet 0  . insulin glulisine (APIDRA) 100 UNIT/ML injection Inject 0.35 mLs (35 Units total) into the skin at bedtime. 10 mL 11  . insulin regular human CONCENTRATED (HUMULIN R) 500 UNIT/ML injection Inject 65-85 Units into the skin 3 (three) times daily with meals. 65 units in the morning 85 units in the afternoon 85 units in the evening    . liraglutide (VICTOZA) 18 MG/3ML SOPN Inject 1.8 mg into the skin every morning.    Marland Kitchen losartan (COZAAR) 25 MG tablet Take 0.5 tablets (12.5 mg total) by mouth daily. 90 tablet 0  . metFORMIN (GLUCOPHAGE) 1000 MG tablet Take 1,000 mg by mouth 2 (two) times daily with a meal.    . ondansetron (ZOFRAN) 4 MG tablet Take 4 mg by mouth every 6 (six) hours as needed for nausea or vomiting.    . OXYGEN Inhale 3 L into the lungs at bedtime.     Marland Kitchen PROVENTIL HFA 108 (90 Base) MCG/ACT inhaler INHALE TWO (2) PUFFS EVERY FOUR HOURS ASNEEDED FOR SHORTNESS OF BREATH 6.7 g 2  . QUEtiapine (SEROQUEL XR) 400 MG 24 hr tablet Take 800 mg by mouth at bedtime.    . ranitidine (ZANTAC) 150 MG tablet Take 150 mg by mouth daily.     Marland Kitchen rOPINIRole (REQUIP) 0.5 MG tablet Take 0.5 mg by mouth at bedtime.    . torsemide (DEMADEX) 10 MG tablet Take 3 tablets by mouth once daily for swelling and fluid  retention. 90 tablet 0  . Vitamin D, Ergocalciferol, (DRISDOL) 50000 units CAPS capsule Take 50,000 Units by mouth every 7 (seven) days. Takes each Wednesday before breakfast weekly.      No current facility-administered medications on file prior to visit.     BP 136/74   Pulse 82   Temp 98.2 F (36.8 C) (Oral)   Wt 296 lb (134.3 kg)   LMP 11/11/2011   SpO2 (!) 88%   BMI 47.78 kg/m    Objective:   Physical Exam  Constitutional: She appears well-nourished.  Neck: Neck supple.  Cardiovascular: Normal rate and regular rhythm.   Pulmonary/Chest: Effort normal and breath sounds normal.  Abdominal: Soft. She exhibits distension. There is no tenderness.  Bowel sounds noted, but difficult to auscultate given abdominal girth.  Genitourinary: Rectal exam shows guaiac positive stool. Rectal exam shows no external hemorrhoid, no mass and no tenderness.  Skin: Skin is warm and dry.          Assessment & Plan:

## 2017-04-18 ENCOUNTER — Telehealth (HOSPITAL_COMMUNITY): Payer: Self-pay

## 2017-04-18 ENCOUNTER — Telehealth: Payer: Self-pay | Admitting: *Deleted

## 2017-04-18 NOTE — Telephone Encounter (Signed)
Called patient to discuss Pulmonary Rehab - Left message to call back.

## 2017-04-18 NOTE — Telephone Encounter (Signed)
Yes, approved

## 2017-04-18 NOTE — Telephone Encounter (Signed)
Manus Gunning with Hospice left a voicemail stating that they got a referral from [employer] for palliative care. Manus Gunning stated that this is not for Hospice care, but for their Palliative Care Program. This would allow weekly nursing visits for patient. Manus Gunning stated that they need [name]'s permission for their services and requested a call back.

## 2017-04-19 NOTE — Telephone Encounter (Signed)
Spoken to Manus Gunning and notified of the approval of the verbal order.

## 2017-04-24 DIAGNOSIS — H2513 Age-related nuclear cataract, bilateral: Secondary | ICD-10-CM | POA: Diagnosis not present

## 2017-04-24 DIAGNOSIS — E113513 Type 2 diabetes mellitus with proliferative diabetic retinopathy with macular edema, bilateral: Secondary | ICD-10-CM | POA: Diagnosis not present

## 2017-04-24 DIAGNOSIS — H43811 Vitreous degeneration, right eye: Secondary | ICD-10-CM | POA: Diagnosis not present

## 2017-04-26 NOTE — Telephone Encounter (Signed)
Patient returned phone call in regards to Pulmonary Rehab program. She is interested - Scheduled orientation on 05/22/2017 @ 1:30pm. She will be attending the 1:30pm exercise classes.

## 2017-04-26 NOTE — Telephone Encounter (Signed)
Attempted to call patient in regards to Pulmonary Rehab. LMTCB °

## 2017-04-28 ENCOUNTER — Other Ambulatory Visit: Payer: Self-pay | Admitting: Primary Care

## 2017-04-28 ENCOUNTER — Encounter: Payer: Self-pay | Admitting: Adult Health

## 2017-04-28 DIAGNOSIS — G2581 Restless legs syndrome: Secondary | ICD-10-CM

## 2017-04-28 NOTE — Telephone Encounter (Signed)
Ok to refill? Electronically refill request for rOPINIRole (REQUIP) 0.5 MG tablet  Medication have not been prescribed by Anda Kraft. Last seen on 04/14/2017

## 2017-04-30 NOTE — Telephone Encounter (Signed)
Noted, refills sent to pharmacy. 

## 2017-05-01 ENCOUNTER — Other Ambulatory Visit: Payer: Self-pay | Admitting: Primary Care

## 2017-05-01 NOTE — Telephone Encounter (Signed)
Ok to refill? Electronically refill request for busPIRone (BUSPAR) 30 MG tablet  Medication have not been prescribed by Anda Kraft. Last see on 04/14/2017

## 2017-05-02 NOTE — Telephone Encounter (Signed)
Did she see psychiatry yet?

## 2017-05-03 NOTE — Telephone Encounter (Signed)
Message left for patient to return my call.  

## 2017-05-03 NOTE — Telephone Encounter (Signed)
Send MyChart message as well.

## 2017-05-04 ENCOUNTER — Other Ambulatory Visit: Payer: Self-pay | Admitting: Primary Care

## 2017-05-04 DIAGNOSIS — I5032 Chronic diastolic (congestive) heart failure: Secondary | ICD-10-CM

## 2017-05-04 DIAGNOSIS — F331 Major depressive disorder, recurrent, moderate: Secondary | ICD-10-CM | POA: Diagnosis not present

## 2017-05-04 NOTE — Telephone Encounter (Signed)
Please notify patient that this will need to be refilled by her psychiatrist moving forward. This prevents any duplicates or other mistakes.

## 2017-05-04 NOTE — Telephone Encounter (Signed)
Spoken to patient. She stated that she has an appointment today. She asked if [name] would refill just in case.

## 2017-05-04 NOTE — Telephone Encounter (Signed)
Please send torsemide refill to cardiology. Patient no longer taking Xanax.

## 2017-05-05 MED ORDER — BUSPIRONE HCL 30 MG PO TABS: 30.0000 mg | ORAL_TABLET | Freq: Two times a day (BID) | ORAL | 0 refills | Status: DC

## 2017-05-05 NOTE — Telephone Encounter (Signed)
Spoken and notified patient of [name]'s comments. Patient stated she told her psy that we would refill this for her.  Ok per [place] for refill for 30 days

## 2017-05-05 NOTE — Telephone Encounter (Signed)
Spoken and notified patient of [name]'s comments. Patient verbalized understanding. 

## 2017-05-10 ENCOUNTER — Ambulatory Visit: Payer: BLUE CROSS/BLUE SHIELD | Admitting: Gastroenterology

## 2017-05-10 DIAGNOSIS — F17200 Nicotine dependence, unspecified, uncomplicated: Secondary | ICD-10-CM | POA: Diagnosis not present

## 2017-05-10 DIAGNOSIS — I5032 Chronic diastolic (congestive) heart failure: Secondary | ICD-10-CM | POA: Diagnosis not present

## 2017-05-10 DIAGNOSIS — I10 Essential (primary) hypertension: Secondary | ICD-10-CM | POA: Diagnosis not present

## 2017-05-11 ENCOUNTER — Ambulatory Visit (INDEPENDENT_AMBULATORY_CARE_PROVIDER_SITE_OTHER): Payer: BLUE CROSS/BLUE SHIELD | Admitting: Endocrinology

## 2017-05-11 ENCOUNTER — Encounter: Payer: Self-pay | Admitting: Endocrinology

## 2017-05-11 VITALS — BP 132/80 | HR 99 | Ht 66.0 in | Wt 269.0 lb

## 2017-05-11 DIAGNOSIS — E1165 Type 2 diabetes mellitus with hyperglycemia: Secondary | ICD-10-CM | POA: Diagnosis not present

## 2017-05-11 DIAGNOSIS — Z794 Long term (current) use of insulin: Secondary | ICD-10-CM

## 2017-05-11 DIAGNOSIS — I2510 Atherosclerotic heart disease of native coronary artery without angina pectoris: Secondary | ICD-10-CM | POA: Diagnosis not present

## 2017-05-11 LAB — POCT GLYCOSYLATED HEMOGLOBIN (HGB A1C): Hemoglobin A1C: 10.7

## 2017-05-11 MED ORDER — INSULIN REGULAR HUMAN (CONC) 500 UNIT/ML ~~LOC~~ SOPN: PEN_INJECTOR | SUBCUTANEOUS | 11 refills | Status: DC

## 2017-05-11 NOTE — Progress Notes (Signed)
Subjective:    Patient ID: [name], female    DOB: Feb 17, 1971, 46 y.o.   MRN: [id number]  HPI pt is referred by Alma Friendly, NP, for diabetes.  Pt states DM was dx'ed in 1988; she has moderate neuropathy of the lower extremities; she has associated TIA, renal insuff, and DR; she has been on insulin since soon after dx; pt says her diet and exercise are good; she has never had GDM, pancreatitis, pancreatic surgery, or DKA.  Last episode of severe hypoglycemia was a few weeks ago.  She takes victoza, BID U-500 (80 units with breakfast, and 110 units with supper), metformin, and QHS apidra.  She eats a very small lunch, if she eats then at all.  She says she never misses DM meds.  She says cbg's vary from 200-500.  She denies hypoglycemia.   Past Medical History:  Diagnosis Date  . Anxiety    takes Prozac daily  . Anxiety   . Aortic valve calcification   . Asthma    Advair and Spirva daily  . Asthma   . Bipolar disorder ([facility])   . CAD (coronary artery disease)    a. LHC 11/2013 done for CP/fluid retention: mild disease in prox LAD, mild-mod disease in mRCA, EF 60% with normal LVEDP. b. Normal nuc 03/2016.  Marland Kitchen CHF (congestive heart failure) ([place])   . Chronic diastolic CHF (congestive heart failure) ([place])   . CKD (chronic kidney disease), stage II   . COPD (chronic obstructive pulmonary disease) (HCC)    a. nocturnal O2.  Marland Kitchen COPD (chronic obstructive pulmonary disease) ([place])   . Coronary artery disease   . Diabetes mellitus   . Diabetes mellitus without complication ([place])   . Dyspnea   . Family history of adverse reaction to anesthesia    mom gets nauseated  . GERD (gastroesophageal reflux disease)    takes Pepcid daily  . History of blood clots    left leg 3-71yrs ago  . Hyperlipidemia   . Hypertension   . Hypertriglyceridemia   . Inguinal hernia, left 01/2015  . Muscle spasm   . Peripheral neuropathy   . RBBB   . Seizures ([place])   . Sinus tachycardia    a. persistent  since 2009.  Marland Kitchen Smokers' cough ([place])   . Stroke [facility]) 1989   left sided weakness  . TIA (transient ischemic attack)   . Tobacco abuse     Past Surgical History:  Procedure Laterality Date  . HERNIA REPAIR    . INGUINAL HERNIA REPAIR Left 04/08/2015   Procedure: OPEN LEFT INGUINAL HERNIA REPAIR WITH MESH;  Surgeon: Ralene Ok, MD;  Location: [facility];  Service: General;  Laterality: Left;  . INSERTION OF MESH Left 04/08/2015   Procedure: INSERTION OF MESH;  Surgeon: Ralene Ok, MD;  Location: [facility];  Service: General;  Laterality: Left;  . LAPAROSCOPY     Endometriosis  . LEFT HEART CATHETERIZATION WITH CORONARY ANGIOGRAM N/A 12/05/2013   Procedure: LEFT HEART CATHETERIZATION WITH CORONARY ANGIOGRAM;  Surgeon: Jettie Booze, MD;  Location: [facility] CATH LAB;  Service: Cardiovascular;  Laterality: N/A;  . right kidney drained    . TEE WITHOUT CARDIOVERSION N/A 11/28/2013   Procedure: TRANSESOPHAGEAL ECHOCARDIOGRAM (TEE);  Surgeon: Thayer Headings, MD;  Location: [facility] ENDOSCOPY;  Service: Cardiovascular;  Laterality: N/A;    Social History   Social History  . Marital status: Married    Spouse name: N/A  . Number of children: N/A  .  Years of education: N/A   Occupational History  . Not on file.   Social History Main Topics  . Smoking status: Current Some Day Smoker    Packs/day: 0.50    Years: 30.00    Types: Cigarettes    Last attempt to quit: 01/08/2017  . Smokeless tobacco: Never Used  . Alcohol use Yes  . Drug use: No  . Sexual activity: No   Other Topics Concern  . Not on file   Social History Narrative   ** Merged History Encounter **       Lives in [place]   Married but lives with Boyfriend - not legally separated   Disabled - for BiPolar, Seizure disorder, diabetes   Formerly worked at [employer]          Current Outpatient Prescriptions on File Prior to Visit  Medication Sig Dispense Refill  . acetaminophen (TYLENOL) 325 MG tablet Take 2 tablets (650 mg  total) by mouth every 4 (four) hours as needed for headache or mild pain. 30 tablet 2  . albuterol (PROVENTIL HFA;VENTOLIN HFA) 108 (90 Base) MCG/ACT inhaler Inhale 2 puffs into the lungs every 4 (four) hours as needed for wheezing or shortness of breath.    Marland Kitchen aspirin EC 81 MG tablet Take 81 mg by mouth every morning.    Marland Kitchen atorvastatin (LIPITOR) 40 MG tablet Take 40 mg by mouth daily.    . budesonide-formoterol (SYMBICORT) 80-4.5 MCG/ACT inhaler Take 2 puffs first thing in am and then another 2 puffs about 12 hours later. (Patient taking differently: Inhale 2 puffs into the lungs 2 (two) times daily. ) 1 Inhaler 11  . busPIRone (BUSPAR) 30 MG tablet Take 1 tablet (30 mg total) by mouth 2 (two) times daily. 60 tablet 0  . hydrOXYzine (ATARAX/VISTARIL) 10 MG tablet Take 1 tablet (10 mg total) by mouth 2 (two) times daily as needed for anxiety. 30 tablet 0  . liraglutide (VICTOZA) 18 MG/3ML SOPN Inject 1.8 mg into the skin every morning.    . metFORMIN (GLUCOPHAGE) 1000 MG tablet Take 1,000 mg by mouth 2 (two) times daily with a meal.    . ondansetron (ZOFRAN) 4 MG tablet Take 4 mg by mouth every 6 (six) hours as needed for nausea or vomiting.    . OXYGEN Inhale 3 L into the lungs at bedtime.     . pregabalin (LYRICA) 300 MG capsule Take 1 capsule (300 mg total) by mouth 2 (two) times daily. 180 capsule 0  . PROVENTIL HFA 108 (90 Base) MCG/ACT inhaler INHALE TWO (2) PUFFS EVERY FOUR HOURS ASNEEDED FOR SHORTNESS OF BREATH 6.7 g 2  . QUEtiapine (SEROQUEL XR) 400 MG 24 hr tablet Take 800 mg by mouth at bedtime.    Marland Kitchen rOPINIRole (REQUIP) 0.5 MG tablet TAKE ONE TABLET BY MOUTH EVERY NIGHT AT BEDTIME 90 tablet 0  . torsemide (DEMADEX) 10 MG tablet Take 3 tablets by mouth once daily for swelling and fluid retention. 90 tablet 0  . varenicline (CHANTIX CONTINUING MONTH PAK) 1 MG tablet Take 1 tablet (1 mg total) by mouth 2 (two) times daily. 60 tablet 1  . losartan (COZAAR) 25 MG tablet Take 0.5 tablets (12.5  mg total) by mouth daily. (Patient not taking: Reported on 05/11/2017) 90 tablet 0  . ranitidine (ZANTAC) 150 MG tablet Take 150 mg by mouth daily.     . Vitamin D, Ergocalciferol, (DRISDOL) 50000 units CAPS capsule Take 50,000 Units by mouth every 7 (seven) days. Takes each  Wednesday before breakfast weekly.      No current facility-administered medications on file prior to visit.     Allergies  Allergen Reactions  . Metoprolol Shortness Of Breath    Occurrence of shortness of breath after 3 days  . Montelukast Shortness Of Breath  . Morphine Sulfate Shortness Of Breath and Nausea And Vomiting    Swollen Throat - Able to tolerate dilaudid  . Penicillins Hives and Shortness Of Breath    Swelling on throat Has patient had a PCN reaction causing immediate rash, facial/tongue/throat swelling, SOB or lightheadedness with hypotension: Yes Has patient had a PCN reaction causing severe rash involving mucus membranes or skin necrosis: No Has patient had a PCN reaction that required hospitalization Yes Has patient had a PCN reaction occurring within the last 10 years: No If all of the above answers are "NO", then may proceed with Cephalosporin use.   . Prednisone Anaphylaxis  . Diltiazem Swelling  . Gabapentin Swelling    Family History  Problem Relation Age of Onset  . Venous thrombosis Brother   . Other Brother        BRAIN TUMOR  . Asthma Father   . Diabetes Father   . Coronary artery disease Mother   . Hypertension Mother   . Diabetes Mother   . Asthma Sister   . Diabetes type II Brother     BP 132/80   Pulse 99   Ht 5\' 6"  (1.676 m)   Wt 269 lb (122 kg)   LMP 11/11/2011   SpO2 92%   BMI 43.42 kg/m     Review of Systems denies blurry vision, chest pain, sob, n/v, excessive diaphoresis, memory loss, depression, cold intolerance, and rhinorrhea.  Pt has lost 30 lbs x a few months (intentional).  She has intermitt headache, leg cramps, and easy bruising.  She attributes  frequent urination to diuretic rx.     Objective:   Physical Exam VS: see vs page GEN: no distress.  Morbid obesity.  HEAD: head: no deformity eyes: no periorbital swelling, no proptosis external nose and ears are normal mouth: no lesion seen NECK: supple, thyroid is not enlarged CHEST WALL: no deformity LUNGS: clear to auscultation CV: reg rate and rhythm, no murmur ABD: abdomen is soft, nontender.  no hepatosplenomegaly.  not distended.  no hernia MUSCULOSKELETAL: muscle bulk and strength are grossly normal.  no obvious joint swelling.  gait is normal and steady.  EXTEMITIES: no deformity.  no ulcer on the feet, but the skin is dry.  feet are of normal color and temp.  Trace bilat leg edema.  There is bilateral onychomycosis of the toenails.  PULSES: dorsalis pedis intact bilat.  no carotid bruit NEURO:  cn 2-12 grossly intact.   readily moves all 4's.  sensation is intact to touch on the feet, but decreased from normal.  SKIN:  Normal texture and temperature.  No rash or suspicious lesion is visible.   NODES:  None palpable at the neck.  PSYCH: alert, well-oriented.  Does not appear anxious nor depressed.    Lab Results  Component Value Date   HGBA1C 10.7 05/11/2017    I have reviewed outside records, and summarized: Pt was noted to have severely elevated a1c, and referred here.  Main other probs addressed were constipation and smoking.  I personally reviewed electrocardiogram tracing (02/28/17): Indication: visual loss Impression: ST  No MI.  Bifascicular block Compared to 02/24/17: no significant change  Lab Results  Component Value Date  CREATININE 1.01 03/22/2017   BUN 14 03/22/2017   NA 141 03/22/2017   K 4.4 03/22/2017   CL 101 03/22/2017   CO2 31 03/22/2017       Assessment & Plan:  Insulin-requiring type 2 DM: severe exacerbation.    Patient Instructions  good diet and exercise significantly improve the control of your diabetes.  please let me know if  you wish to be referred to a dietician.  high blood sugar is very risky to your health.  you should see an eye doctor and dentist every year.  It is very important to get all recommended vaccinations.  Controlling your blood pressure and cholesterol drastically reduces the damage diabetes does to your body.  Those who smoke should quit.  Please discuss these with your doctor.  check your blood sugar twice a day.  vary the time of day when you check, between before the 3 meals, and at bedtime.  also check if you have symptoms of your blood sugar being too high or too low.  please keep a record of the readings and bring it to your next appointment here (or you can bring the meter itself).  You can write it on any piece of paper.  please call us sooner if your blood sugar goes below 70, or if you have a lot of readings over 200. For now, please: Stop taking the apidra, and: Increase the U-500 to 120 units with breakfast, 30 units with lunch, and 150 units with supper, and: Please continue the same metformin and victoza. Please call or message [place] next week, to tell us how the blood sugar is doing. Please come back for a follow-up appointment in 2 weeks.

## 2017-05-11 NOTE — Patient Instructions (Addendum)
good diet and exercise significantly improve the control of your diabetes.  please let me know if you wish to be referred to a dietician.  high blood sugar is very risky to your health.  you should see an eye doctor and dentist every year.  It is very important to get all recommended vaccinations.  Controlling your blood pressure and cholesterol drastically reduces the damage diabetes does to your body.  Those who smoke should quit.  Please discuss these with your doctor.  check your blood sugar twice a day.  vary the time of day when you check, between before the 3 meals, and at bedtime.  also check if you have symptoms of your blood sugar being too high or too low.  please keep a record of the readings and bring it to your next appointment here (or you can bring the meter itself).  You can write it on any piece of paper.  please call us sooner if your blood sugar goes below 70, or if you have a lot of readings over 200. For now, please: Stop taking the apidra, and: Increase the U-500 to 120 units with breakfast, 30 units with lunch, and 150 units with supper, and: Please continue the same metformin and victoza. Please call or message [place] next week, to tell us how the blood sugar is doing. Please come back for a follow-up appointment in 2 weeks.

## 2017-05-12 ENCOUNTER — Telehealth: Payer: Self-pay | Admitting: Primary Care

## 2017-05-12 NOTE — Telephone Encounter (Signed)
Patient is using the maintaince dose fourth week of first month. Patient reports she is not feeling any difference. It is not taking the craving away. Informed patient of the support system on line. Patient wants to know if there is a stronger dosing. She is going to refill and continuing. Please let patient know.  Patient reports she is constipated. She has not moved bowels in over 1 week. Patient is using Miralax every morning in coffee and stool softeners. Patient states she was given permission to use a women's laxative as well and she is going to try that. Advised patient to make sure she is watching her hydration. Told patient her message would be forwarded to the provider and her concerns would be addressed.

## 2017-05-12 NOTE — Telephone Encounter (Signed)
Chantix only comes in one dose. If it is not working, I suggest she make an appt with PCP to discuss alternative treatments.

## 2017-05-12 NOTE — Telephone Encounter (Signed)
Copied from [place] #[id number]. Topic: Inquiry >> May 12, 2017  9:33 AM Pricilla Handler wrote: Reason for CRM: Patient wants a call back regarding her Chantix. Patient stated that she needs a stronger dosage. Patient would like to speak with a nurse. Please call patient back this morning. Thank You!!!

## 2017-05-15 NOTE — Telephone Encounter (Signed)
Spoken and notified patient of Regina's comments. Patient verbalized understanding.  Office visit on 05/23/2017

## 2017-05-22 ENCOUNTER — Telehealth (HOSPITAL_COMMUNITY): Payer: Self-pay

## 2017-05-22 ENCOUNTER — Ambulatory Visit (HOSPITAL_COMMUNITY): Payer: BLUE CROSS/BLUE SHIELD

## 2017-05-22 NOTE — Telephone Encounter (Signed)
Patient was a no call/no show. Called and patient forgot and is not interested in rescheduling right now. Will f/u at the beginning of the year.

## 2017-05-23 ENCOUNTER — Encounter: Payer: Self-pay | Admitting: Primary Care

## 2017-05-23 ENCOUNTER — Ambulatory Visit (INDEPENDENT_AMBULATORY_CARE_PROVIDER_SITE_OTHER): Payer: BLUE CROSS/BLUE SHIELD | Admitting: Primary Care

## 2017-05-23 VITALS — BP 100/62 | HR 100 | Temp 98.0°F | Ht 66.0 in | Wt 276.8 lb

## 2017-05-23 DIAGNOSIS — I5033 Acute on chronic diastolic (congestive) heart failure: Secondary | ICD-10-CM

## 2017-05-23 DIAGNOSIS — I10 Essential (primary) hypertension: Secondary | ICD-10-CM | POA: Diagnosis not present

## 2017-05-23 DIAGNOSIS — Z720 Tobacco use: Secondary | ICD-10-CM | POA: Diagnosis not present

## 2017-05-23 DIAGNOSIS — I2510 Atherosclerotic heart disease of native coronary artery without angina pectoris: Secondary | ICD-10-CM

## 2017-05-23 MED ORDER — NICOTINE 21 MG/24HR TD PT24: 21.0000 mg | MEDICATED_PATCH | Freq: Every day | TRANSDERMAL | 1 refills | Status: DC

## 2017-05-23 NOTE — Assessment & Plan Note (Signed)
No improvement with Chantix.  Failed Wellbutrin and nicotine gum in the past. Will have her re-try nicotine patches at 21 mg/24 hours. Plan would be to continue at this dose for 6 weeks, then reduce dose. Discussed instructions for use and to rotate sites. Will message her for an update in 2 weeks.

## 2017-05-23 NOTE — Progress Notes (Signed)
Subjective:    Patient ID: [name], female    DOB: [birth year], 46 y.o.   MRN: [id number]  HPI  Ms. Glinski is a 46 year old female who presents today for follow up of tobacco abuse and other medical problems. She was last seen in our office in early October 2018. She expressed that she was ready to quit smoking and was initiated on Chantix as she had been successful on this in the past.   Since her last visit she's been unable to quit smoking. She completed the starting and continuing pack and this has not helped. She's also tried Wellbutrin and nicotine gum in the past without improvement. She tried the nicotine patches in the past but she sweat these off while working, she's no longer working now.   2) Essential Hypertension: Reports low blood pressure readings over the past 2 weeks. BP of 95/69 this morning at home and other readings of 110/76, 102/72, 133/86, 114/77, 107/79 throughout the last week.   BP Readings from Last 3 Encounters:  05/23/17 100/62  05/11/17 132/80  04/14/17 136/74   3) Congestive Heart Failure: Currently following with cardiology and managed on torsemide 20 mg one daily. Previously managed on torsemide 30 mg and metolazone 2.5 mg once daily, but torsemide was reduced to 20 mg and her metolazone was discontinued by her cardiologist once week ago. Over the past one week she's gained 10 pounds. She's weighing herself daily. She was told that her goal weight was 210. She's not yet called her cardiologist. She denies chest pain, shortness of breath.   Wt Readings from Last 3 Encounters:  05/23/17 276 lb 12.8 oz (125.6 kg)  05/11/17 269 lb (122 kg)  04/14/17 296 lb (134.3 kg)     Review of Systems  Constitutional: Positive for unexpected weight change.  Respiratory: Negative for shortness of breath.   Cardiovascular: Negative for chest pain.  Neurological: Negative for dizziness and headaches.       Past Medical History:  Diagnosis Date  . Anxiety    takes Prozac daily  . Anxiety   . Aortic valve calcification   . Asthma    Advair and Spirva daily  . Asthma   . Bipolar disorder ([place])   . CAD (coronary artery disease)    a. LHC 11/2013 done for CP/fluid retention: mild disease in prox LAD, mild-mod disease in mRCA, EF 60% with normal LVEDP. b. Normal nuc 03/2016.  Marland Kitchen CHF (congestive heart failure) ([place])   . Chronic diastolic CHF (congestive heart failure) ([place])   . CKD (chronic kidney disease), stage II   . COPD (chronic obstructive pulmonary disease) (HCC)    a. nocturnal O2.  Marland Kitchen COPD (chronic obstructive pulmonary disease) ([place])   . Coronary artery disease   . Diabetes mellitus   . Diabetes mellitus without complication ([employer])   . Dyspnea   . Family history of adverse reaction to anesthesia    mom gets nauseated  . GERD (gastroesophageal reflux disease)    takes Pepcid daily  . History of blood clots    left leg 3-60yrs ago  . Hyperlipidemia   . Hypertension   . Hypertriglyceridemia   . Inguinal hernia, left 01/2015  . Muscle spasm   . Peripheral neuropathy   . RBBB   . Seizures ([place])   . Sinus tachycardia    a. persistent since 2009.  Marland Kitchen Smokers' cough ([place])   . Stroke [facility]) 1989   left sided weakness  . TIA (transient  ischemic attack)   . Tobacco abuse      Social History   Socioeconomic History  . Marital status: Married    Spouse name: Not on file  . Number of children: Not on file  . Years of education: Not on file  . Highest education level: Not on file  Social Needs  . Financial resource strain: Not on file  . Food insecurity - worry: Not on file  . Food insecurity - inability: Not on file  . Transportation needs - medical: Not on file  . Transportation needs - non-medical: Not on file  Occupational History  . Not on file  Tobacco Use  . Smoking status: Current Some Day Smoker    Packs/day: 0.50    Years: 30.00    Pack years: 15.00    Types: Cigarettes    Last attempt to quit: 01/08/2017    Years  since quitting: 0.3  . Smokeless tobacco: Never Used  Substance and Sexual Activity  . Alcohol use: Yes  . Drug use: No  . Sexual activity: No  Other Topics Concern  . Not on file  Social History Narrative   ** Merged History Encounter **       Lives in [place]   Married but lives with Boyfriend - not legally separated   Disabled - for BiPolar, Seizure disorder, diabetes   Formerly worked at [employer]          Past Surgical History:  Procedure Laterality Date  . HERNIA REPAIR    . LAPAROSCOPY     Endometriosis  . right kidney drained      Family History  Problem Relation Age of Onset  . Venous thrombosis Brother   . Other Brother        BRAIN TUMOR  . Asthma Father   . Diabetes Father   . Coronary artery disease Mother   . Hypertension Mother   . Diabetes Mother   . Asthma Sister   . Diabetes type II Brother     Allergies  Allergen Reactions  . Metoprolol Shortness Of Breath    Occurrence of shortness of breath after 3 days  . Montelukast Shortness Of Breath  . Morphine Sulfate Shortness Of Breath and Nausea And Vomiting    Swollen Throat - Able to tolerate dilaudid  . Penicillins Hives and Shortness Of Breath    Swelling on throat Has patient had a PCN reaction causing immediate rash, facial/tongue/throat swelling, SOB or lightheadedness with hypotension: Yes Has patient had a PCN reaction causing severe rash involving mucus membranes or skin necrosis: No Has patient had a PCN reaction that required hospitalization Yes Has patient had a PCN reaction occurring within the last 10 years: No If all of the above answers are "NO", then may proceed with Cephalosporin use.   . Prednisone Anaphylaxis  . Diltiazem Swelling  . Gabapentin Swelling    Current Outpatient Medications on File Prior to Visit  Medication Sig Dispense Refill  . acetaminophen (TYLENOL) 325 MG tablet Take 2 tablets (650 mg total) by mouth every 4 (four) hours as needed for headache or mild  pain. 30 tablet 2  . albuterol (PROVENTIL HFA;VENTOLIN HFA) 108 (90 Base) MCG/ACT inhaler Inhale 2 puffs into the lungs every 4 (four) hours as needed for wheezing or shortness of breath.    . ALPRAZolam (XANAX) 1 MG tablet Take 1 mg by mouth at bedtime as needed for anxiety.    Marland Kitchen aspirin EC 81 MG tablet Take 81 mg  by mouth every morning.    Marland Kitchen atorvastatin (LIPITOR) 40 MG tablet Take 40 mg by mouth daily.    . budesonide-formoterol (SYMBICORT) 80-4.5 MCG/ACT inhaler Take 2 puffs first thing in am and then another 2 puffs about 12 hours later. (Patient taking differently: Inhale 2 puffs into the lungs 2 (two) times daily. ) 1 Inhaler 11  . busPIRone (BUSPAR) 30 MG tablet Take 1 tablet (30 mg total) by mouth 2 (two) times daily. 60 tablet 0  . FLUoxetine (PROZAC) 40 MG capsule Take 40 mg by mouth daily.    . hydrOXYzine (ATARAX/VISTARIL) 10 MG tablet Take 1 tablet (10 mg total) by mouth 2 (two) times daily as needed for anxiety. 30 tablet 0  . insulin regular human CONCENTRATED (HUMULIN R U-500 KWIKPEN) 500 UNIT/ML kwikpen 3 times a day (just before each meal) 120-30-150 units, and pen needles 3/day 6 pen 11  . liraglutide (VICTOZA) 18 MG/3ML SOPN Inject 1.8 mg into the skin every morning.    . metFORMIN (GLUCOPHAGE) 1000 MG tablet Take 1,000 mg by mouth 2 (two) times daily with a meal.    . metolazone (ZAROXOLYN) 2.5 MG tablet Take 2.5 mg by mouth daily.    . ondansetron (ZOFRAN) 4 MG tablet Take 4 mg by mouth every 6 (six) hours as needed for nausea or vomiting.    . OXYGEN Inhale 3 L into the lungs at bedtime.     . pregabalin (LYRICA) 300 MG capsule Take 1 capsule (300 mg total) by mouth 2 (two) times daily. 180 capsule 0  . PROVENTIL HFA 108 (90 Base) MCG/ACT inhaler INHALE TWO (2) PUFFS EVERY FOUR HOURS ASNEEDED FOR SHORTNESS OF BREATH 6.7 g 2  . QUEtiapine (SEROQUEL XR) 400 MG 24 hr tablet Take 800 mg by mouth at bedtime.    . ranitidine (ZANTAC) 150 MG tablet Take 150 mg by mouth daily.       Marland Kitchen rOPINIRole (REQUIP) 0.5 MG tablet TAKE ONE TABLET BY MOUTH EVERY NIGHT AT BEDTIME 90 tablet 0  . torsemide (DEMADEX) 10 MG tablet Take 3 tablets by mouth once daily for swelling and fluid retention. (Patient taking differently: Take 2 tablets by mouth once daily for swelling and fluid retention.) 90 tablet 0  . Vitamin D, Ergocalciferol, (DRISDOL) 50000 units CAPS capsule Take 50,000 Units by mouth every 7 (seven) days. Takes each Wednesday before breakfast weekly.      No current facility-administered medications on file prior to visit.     BP 100/62   Pulse 100   Temp 98 F (36.7 C) (Oral)   Ht 5\' 6"  (1.676 m)   Wt 276 lb 12.8 oz (125.6 kg)   LMP 11/11/2011   SpO2 91%   BMI 44.68 kg/m    Objective:   Physical Exam  Constitutional: She appears well-nourished.  Neck: Neck supple.  Cardiovascular: Normal rate and regular rhythm.  Pulmonary/Chest: Effort normal and breath sounds normal.  Skin: Skin is warm and dry.          Assessment & Plan:

## 2017-05-23 NOTE — Assessment & Plan Note (Addendum)
Now on torsemide 20 mg once daily. Weight gain of 10 pounds in one week, she is asymptomatic. Recommend she call her cardiologist immediately as her weight gain was secondary to medication changes made in their office. Would recommend to either increase torsemide to 30 mg or continue torsemide at 20 mg and add in metolazone twice weekly. Recent renal function unremarkable. She will update [place] once she's spoken with her cardiologist.

## 2017-05-23 NOTE — Assessment & Plan Note (Signed)
BP stable today, discussed to continue monitoring.  She will also discuss readings with cardiologist.

## 2017-05-23 NOTE — Patient Instructions (Signed)
Please call your cardiologist and notify them of your weight gain since the reduction of torsemide and discontinuation of metolazone.  Please message me and notify me what they decide to do.  Start the nicotine patches. Apply one patch onto a non hair site once daily, replace the patch every night at bedtime. Rotate the site of the patch daily. Do this for 6 weeks. We will re-evaluate at that time.  Please message me online if you develop skin irritation.  It was a pleasure to see you today!

## 2017-05-24 DIAGNOSIS — F17200 Nicotine dependence, unspecified, uncomplicated: Secondary | ICD-10-CM | POA: Diagnosis not present

## 2017-05-24 DIAGNOSIS — I5032 Chronic diastolic (congestive) heart failure: Secondary | ICD-10-CM | POA: Diagnosis not present

## 2017-05-24 DIAGNOSIS — I10 Essential (primary) hypertension: Secondary | ICD-10-CM | POA: Diagnosis not present

## 2017-05-25 ENCOUNTER — Other Ambulatory Visit
Admission: RE | Admit: 2017-05-25 | Discharge: 2017-05-25 | Disposition: A | Discharge status: Home / Self Care | Payer: BLUE CROSS/BLUE SHIELD | Source: Ambulatory Visit | Attending: Gastroenterology | Admitting: Gastroenterology

## 2017-05-25 ENCOUNTER — Encounter: Payer: Self-pay | Admitting: Gastroenterology

## 2017-05-25 ENCOUNTER — Ambulatory Visit (INDEPENDENT_AMBULATORY_CARE_PROVIDER_SITE_OTHER): Payer: BLUE CROSS/BLUE SHIELD | Admitting: Gastroenterology

## 2017-05-25 VITALS — BP 164/79 | HR 111 | Temp 98.3°F | Ht 66.0 in | Wt 274.4 lb

## 2017-05-25 DIAGNOSIS — K625 Hemorrhage of anus and rectum: Secondary | ICD-10-CM | POA: Diagnosis not present

## 2017-05-25 DIAGNOSIS — I2510 Atherosclerotic heart disease of native coronary artery without angina pectoris: Secondary | ICD-10-CM

## 2017-05-25 DIAGNOSIS — K5900 Constipation, unspecified: Secondary | ICD-10-CM

## 2017-05-25 DIAGNOSIS — D649 Anemia, unspecified: Secondary | ICD-10-CM

## 2017-05-25 LAB — FOLATE: Folate: 11.3 ng/mL (ref >5.9)

## 2017-05-25 LAB — VITAMIN B12: Vitamin B-12: 306 pg/mL (ref 180–914)

## 2017-05-25 LAB — IRON AND TIBC
Iron: 66 ug/dL (ref 28–170)
Saturation Ratios: 17 % (ref 10.4–31.8)
TIBC: 395 ug/dL (ref 250–450)
UIBC: 329 ug/dL

## 2017-05-25 LAB — FERRITIN: Ferritin: 87 ng/mL (ref 11–307)

## 2017-05-25 NOTE — Progress Notes (Signed)
Jonathon Bellows MD, MRCP(U.K) [address]  [place]  [place], [place] [address]  Main: [phone]  Fax: [phone]   Gastroenterology Consultation  Referring Provider:     Pleas Koch, NP Primary Care Physician:  Pleas Koch, NP Primary Gastroenterologist:  Dr. Jonathon Bellows  Reason for Consultation:     Rectal bleeding and constipation         HPI:   [name] is a 46 y.o. y/o female referred for consultation & management  by Dr. Carlis Abbott, Leticia Penna, NP.     She has been referred for constipation and rectal bleeding . Last Hb 11.1 with MCV 86    Constipation: Onset many years, getting worse  Frequency of bowel movements every two weeks  Consistency: not very hard. Last bowel movement was on 05/12/17 , when she has a bowel movement - notices blood on the stool  Shape : no change in shape Pain no  Bloating/abdominal distension yes  Bleeding yes , lot sof gas  Last colonoscopy : never had one  Weight loss some weight loss because she has been ona diet  Family history of colon cancer : dad had colon cancer at age 37 Diet : has fruit and vegetables in her diet  Narcotic/anticholinergic medications none  Laxative use : uses womens laxatives-helps  Thyroid abnormalities : none   CBC Latest Ref Rng & Units 04/14/2017 03/17/2017 03/14/2017  WBC 4.0 - 10.5 K/uL 12.7(H) 11.3(H) 13.3(H)  Hemoglobin 12.0 - 15.0 g/dL 11.1(L) 10.9(L) 11.6(L)  Hematocrit 36.0 - 46.0 % 34.8(L) 33.8(L) 36.3  Platelets 150.0 - 400.0 K/uL 178.0 148(L) 163   Except stool not seen any other blood from her nose, urine or vagina.    On "fluid pills for congestive heart failure" says cardiogolist is working on it. She says she has been more short of breath recently. Says her heart always runs fast.    Past Medical History:  Diagnosis Date  . Anxiety    takes Prozac daily  . Anxiety   . Aortic valve calcification   . Asthma    Advair and Spirva daily  . Asthma   . Bipolar disorder  ([place])   . CAD (coronary artery disease)    a. LHC 11/2013 done for CP/fluid retention: mild disease in prox LAD, mild-mod disease in mRCA, EF 60% with normal LVEDP. b. Normal nuc 03/2016.  Marland Kitchen CHF (congestive heart failure) ([place])   . Chronic diastolic CHF (congestive heart failure) ([place])   . CKD (chronic kidney disease), stage II   . COPD (chronic obstructive pulmonary disease) ([facility])    a. nocturnal O2.  Marland Kitchen COPD (chronic obstructive pulmonary disease) ([place])   . Coronary artery disease   . Diabetes mellitus   . Diabetes mellitus without complication ([place])   . Dyspnea   . Family history of adverse reaction to anesthesia    mom gets nauseated  . GERD (gastroesophageal reflux disease)    takes Pepcid daily  . History of blood clots    left leg 3-71yrs ago  . Hyperlipidemia   . Hypertension   . Hypertriglyceridemia   . Inguinal hernia, left 01/2015  . Muscle spasm   . Peripheral neuropathy   . RBBB   . Seizures ([place])   . Sinus tachycardia    a. persistent since 2009.  Marland Kitchen Smokers' cough ([place])   . Stroke [facility]) 1989   left sided weakness  . TIA (transient ischemic attack)   . Tobacco abuse  Past Surgical History:  Procedure Laterality Date  . HERNIA REPAIR    . INGUINAL HERNIA REPAIR Left 04/08/2015   Procedure: OPEN LEFT INGUINAL HERNIA REPAIR WITH MESH;  Surgeon: Ralene Ok, MD;  Location: [facility];  Service: General;  Laterality: Left;  . INSERTION OF MESH Left 04/08/2015   Procedure: INSERTION OF MESH;  Surgeon: Ralene Ok, MD;  Location: [facility];  Service: General;  Laterality: Left;  . LAPAROSCOPY     Endometriosis  . LEFT HEART CATHETERIZATION WITH CORONARY ANGIOGRAM N/A 12/05/2013   Procedure: LEFT HEART CATHETERIZATION WITH CORONARY ANGIOGRAM;  Surgeon: Jettie Booze, MD;  Location: [facility] CATH LAB;  Service: Cardiovascular;  Laterality: N/A;  . right kidney drained    . TEE WITHOUT CARDIOVERSION N/A 11/28/2013   Procedure: TRANSESOPHAGEAL ECHOCARDIOGRAM (TEE);   Surgeon: Thayer Headings, MD;  Location: [facility];  Service: Cardiovascular;  Laterality: N/A;    Prior to Admission medications   Medication Sig Start Date End Date Taking? Authorizing Provider  acetaminophen (TYLENOL) 325 MG tablet Take 2 tablets (650 mg total) by mouth every 4 (four) hours as needed for headache or mild pain. 03/19/17  Yes Patwardhan, Manish J, MD  albuterol (PROVENTIL HFA;VENTOLIN HFA) 108 (90 Base) MCG/ACT inhaler Inhale 2 puffs into the lungs every 4 (four) hours as needed for wheezing or shortness of breath.   Yes [provider]  ALPRAZolam Duanne Moron) 1 MG tablet Take 1 mg by mouth at bedtime as needed for anxiety.   Yes [provider]  aspirin EC 81 MG tablet Take 81 mg by mouth every morning.   Yes [provider]  atorvastatin (LIPITOR) 40 MG tablet Take 40 mg by mouth daily.   Yes [provider]  budesonide-formoterol (SYMBICORT) 80-4.5 MCG/ACT inhaler Take 2 puffs first thing in am and then another 2 puffs about 12 hours later. Patient taking differently: Inhale 2 puffs into the lungs 2 (two) times daily.  06/14/16  Yes Tanda Rockers, MD  busPIRone (BUSPAR) 30 MG tablet Take 1 tablet (30 mg total) by mouth 2 (two) times daily. 05/05/17  Yes Pleas Koch, NP  FARXIGA 10 MG TABS tablet Take 10 mg daily by mouth. 02/23/17  Yes [provider]  FLUoxetine (PROZAC) 40 MG capsule Take 40 mg by mouth daily.   Yes [provider]  hydrOXYzine (ATARAX/VISTARIL) 10 MG tablet Take 1 tablet (10 mg total) by mouth 2 (two) times daily as needed for anxiety. 03/22/17  Yes Pleas Koch, NP  ibuprofen (ADVIL,MOTRIN) 800 MG tablet TAKE 1 TABLET BY MOUTH 3 TIMES A DAY WITH MEALS 04/14/17  Yes [provider]  insulin regular human CONCENTRATED (HUMULIN R U-500 KWIKPEN) 500 UNIT/ML kwikpen 3 times a day (just before each meal) 120-30-150 units, and pen needles 3/day 05/11/17  Yes Renato Shin, MD  liraglutide  (VICTOZA) 18 MG/3ML SOPN Inject 1.8 mg into the skin every morning.   Yes [provider]  metFORMIN (GLUCOPHAGE) 1000 MG tablet Take 1,000 mg by mouth 2 (two) times daily with a meal.   Yes [provider]  metolazone (ZAROXOLYN) 2.5 MG tablet Take 2.5 mg by mouth daily.   Yes [provider]  nicotine (NICODERM CQ - DOSED IN MG/24 HOURS) 21 mg/24hr patch Place 1 patch (21 mg total) at bedtime onto the skin. 05/23/17  Yes Pleas Koch, NP  ondansetron (ZOFRAN) 4 MG tablet Take 4 mg by mouth every 6 (six) hours as needed for nausea or vomiting.  Yes [provider]  OXYGEN Inhale 3 L into the lungs at bedtime.    Yes [provider]  pregabalin (LYRICA) 300 MG capsule Take 1 capsule (300 mg total) by mouth 2 (two) times daily. 04/14/17  Yes Pleas Koch, NP  PROVENTIL HFA 108 [phone] Base) MCG/ACT inhaler INHALE TWO (2) PUFFS EVERY FOUR HOURS ASNEEDED FOR SHORTNESS OF BREATH 07/30/15  Yes Gottschalk, Ashly M, DO  QUEtiapine (SEROQUEL XR) 400 MG 24 hr tablet Take 800 mg by mouth at bedtime.   Yes [provider]  ranitidine (ZANTAC) 150 MG tablet Take 150 mg by mouth daily.    Yes [provider]  rOPINIRole (REQUIP) 0.5 MG tablet TAKE ONE TABLET BY MOUTH EVERY NIGHT AT BEDTIME 04/30/17  Yes Pleas Koch, NP  torsemide (DEMADEX) 10 MG tablet Take 3 tablets by mouth once daily for swelling and fluid retention. Patient taking differently: Take 2 tablets by mouth once daily for swelling and fluid retention. 04/04/17  Yes Pleas Koch, NP  Vitamin D, Ergocalciferol, (DRISDOL) 50000 units CAPS capsule Take 50,000 Units by mouth every 7 (seven) days. Takes each Wednesday before breakfast weekly.    Yes [provider]    Family History  Problem Relation Age of Onset  . Venous thrombosis Brother   . Other Brother        BRAIN TUMOR  . Asthma Father   . Diabetes Father   . Coronary artery disease Mother   .  Hypertension Mother   . Diabetes Mother   . Asthma Sister   . Diabetes type II Brother      Social History   Tobacco Use  . Smoking status: Current Some Day Smoker    Packs/day: 0.50    Years: 30.00    Pack years: 15.00    Types: Cigarettes    Last attempt to quit: 01/08/2017    Years since quitting: 0.3  . Smokeless tobacco: Never Used  Substance Use Topics  . Alcohol use: Yes  . Drug use: No    Allergies as of 05/25/2017 - Review Complete 05/25/2017  Allergen Reaction Noted  . Metoprolol Shortness Of Breath 11/22/2013  . Montelukast Shortness Of Breath 01/15/2017  . Morphine sulfate Shortness Of Breath and Nausea And Vomiting 10/20/2008  . Penicillins Hives and Shortness Of Breath 06/11/2007  . Prednisone Anaphylaxis 01/15/2017  . Diltiazem Swelling 01/15/2017  . Gabapentin Swelling 01/15/2017    Review of Systems:    All systems reviewed and negative except where noted in HPI.   Physical Exam:  BP (!) 164/79   Pulse (!) 111   Temp 98.3 F (36.8 C) (Oral)   Ht 5\' 6"  (1.676 m)   Wt 274 lb 6.4 oz (124.5 kg)   LMP 11/11/2011   BMI 44.29 kg/m  Patient's last menstrual period was 11/11/2011. Psych:  Alert and cooperative. Normal mood and affect. General:   Alert,  Well-developed, well-nourished, pleasant and cooperative in NAD Head:  Normocephalic and atraumatic. Eyes:  Sclera clear, no icterus.   Conjunctiva pink. Ears:  Normal auditory acuity. Nose:  No deformity, discharge, or lesions. Mouth:  No deformity or lesions,oropharynx pink & moist. Neck:  Supple; no masses or thyromegaly. Lungs:  Respirations even and unlabored.  Clear throughout to auscultation.   No wheezes, crackles, or rhonchi. No acute distress. Heart:  Regular rate and rhythm; no murmurs, clicks, rubs, or gallops. Abdomen:  Normal bowel sounds.  No bruits.  Soft, non-tender and non-distended without masses,  hepatosplenomegaly or hernias noted.  No guarding or rebound tenderness.    Neurologic:   Alert and oriented x3;  grossly normal neurologically. Skin:  Intact without significant lesions or rashes. No jaundice. Lymph Nodes:  No significant cervical adenopathy. Psych:  Alert and cooperative. Normal mood and affect.  Imaging Studies: No results found.  Assessment and Plan:   [name] is a 47 y.o. y/o female has been referred for constipation,rectal bleeding  Plan  1. Diagnostic colonoscopy for rectal bleeding  2. High fiber diet  3. Trial of Trulance-  samples provided  4. Normocytic anemia- check iron studies, b12,folate  I have discussed alternative options, risks & benefits,  which include, but are not limited to, bleeding, infection, perforation,respiratory complication & drug reaction.  The patient agrees with this plan & written consent will be obtained.  Follow up in 3 months   Dr Jonathon Bellows MD,MRCP(U.K)

## 2017-05-26 ENCOUNTER — Ambulatory Visit: Payer: BLUE CROSS/BLUE SHIELD | Admitting: Endocrinology

## 2017-05-26 NOTE — Addendum Note (Signed)
Addended by: Peggye Ley on: 05/26/2017 09:58 AM   Modules accepted: Orders, SmartSet

## 2017-05-29 ENCOUNTER — Telehealth: Payer: Self-pay | Admitting: Gastroenterology

## 2017-05-29 ENCOUNTER — Other Ambulatory Visit: Payer: Self-pay | Admitting: Primary Care

## 2017-05-29 ENCOUNTER — Telehealth: Payer: Self-pay

## 2017-05-29 NOTE — Telephone Encounter (Signed)
Patient seeing psychiatry.

## 2017-05-29 NOTE — Telephone Encounter (Signed)
Ok to refill? Electronically refill request for busPIRone (BUSPAR) 30 MG tablet  Last prescribed on 05/05/2017. Last seen on 05/23/2017

## 2017-05-29 NOTE — Telephone Encounter (Signed)
Advised pt of results per Dr. [name].    - Iron studies,b12,folate are normal

## 2017-05-29 NOTE — Telephone Encounter (Signed)
-----   Message from Jonathon Bellows, MD sent at 05/28/2017  6:55 PM EST ----- Iron studies,b12,folate are normal

## 2017-05-29 NOTE — Telephone Encounter (Signed)
Patient left a voice message that the Trulance is working and she would like for you to call her back. [phone]

## 2017-05-30 ENCOUNTER — Telehealth: Payer: Self-pay | Admitting: Primary Care

## 2017-05-30 ENCOUNTER — Telehealth: Payer: Self-pay | Admitting: *Deleted

## 2017-05-30 ENCOUNTER — Other Ambulatory Visit: Payer: Self-pay

## 2017-05-30 ENCOUNTER — Telehealth: Payer: Self-pay | Admitting: Endocrinology

## 2017-05-30 DIAGNOSIS — K5900 Constipation, unspecified: Secondary | ICD-10-CM

## 2017-05-30 MED ORDER — PLECANATIDE 3 MG PO TABS: 3.0000 mg | ORAL_TABLET | ORAL | 3 refills | Status: DC

## 2017-05-30 NOTE — Telephone Encounter (Signed)
Patient b/s is running high, 404. This morning please advise humulin r this morning 120 units, Victoza 1.8 ml this morning  Metformin 550 mg bid running 300 to 400. Called the [facility] call [facility] 1:17 pm today

## 2017-05-30 NOTE — Telephone Encounter (Signed)
PLEASE NOTE: All timestamps contained within this report are represented as [place] Standard Time. CONFIDENTIALTY NOTICE: This fax transmission is intended only for the addressee. It contains information that is legally privileged, confidential or otherwise protected from use or disclosure. If you are not the intended recipient, you are strictly prohibited from reviewing, disclosing, copying using or disseminating any of this information or taking any action in reliance on or regarding this information. If you have received this fax in error, please notify us immediately by telephone so that we can arrange for its return to [place]. Phone: [phone], Toll-Free: [phone], Fax: [phone] Page: 1 of 1 Call Id: [id number] [facility] - Client [place] Patient Name: [name] DOB: [birth year] Initial Comment Caller needs to call in an update for a patient through Care connection. Blood Sugar 404 Nurse Assessment Nurse: Dimas Chyle, RN, Dellis Filbert Date/Time Eilene Ghazi Time): 05/30/2017 1:14:21 PM Confirm and document reason for call. If symptomatic, describe symptoms. ---Caller needs to call in an update for a patient through Care connection. Blood Sugar 404. BS reading was from this morning. Humilin R 120 units this morning, Victoza 1.8 ml this morning, metformin 500 mg bid. Running 300-400 twice a day for a week. Does the patient have any new or worsening symptoms? ---Yes Will a triage be completed? ---Yes Related visit to physician within the last 2 weeks? ---No Does the PT have any chronic conditions? (i.e. diabetes, asthma, etc.) ---Yes List chronic conditions. ---DDM 2, CHF, CKD, COPD Is the patient pregnant or possibly pregnant? (Ask all females between the ages of 24-55) ---No Is this a behavioral health or substance abuse call? ---No Guidelines Guideline Title Affirmed Question Affirmed Notes Diabetes - High Blood Sugar  Blood glucose > 400 mg/dl (22 mmol/l) Final Disposition User Call PCP Now Dimas Chyle, RN, Dellis Filbert Comments Spoke with office and they requested caller be transferred to them. Referrals REFERRED TO PCP OFFICE Caller Disagree/Comply Comply Caller Understands Yes PreDisposition Call Doctor

## 2017-05-30 NOTE — Telephone Encounter (Signed)
Take an extra 50 units, in addition to scheduled insulin.  Any reason you can think of why it is high, such as steroids?

## 2017-05-30 NOTE — Telephone Encounter (Signed)
[facility] Palliative RN called and states the patient Blood sugar was elevated today to 400 patient took 120 units of her Humlin R this AM. She also took 1.8 mg of her Victoza and also 500 mg of her Meformin. Her blood sugar came down to 401 around lunch time. Patient states that her blood sugar has been elevated the past week in the 300 -400 . Patient is requesting a call back. Her call back number is  [phone]. Please advise. Thank you

## 2017-05-31 ENCOUNTER — Other Ambulatory Visit: Payer: Self-pay

## 2017-05-31 DIAGNOSIS — K5900 Constipation, unspecified: Secondary | ICD-10-CM

## 2017-05-31 MED ORDER — LUBIPROSTONE 24 MCG PO CAPS: 24.0000 ug | ORAL_CAPSULE | Freq: Two times a day (BID) | ORAL | 3 refills | Status: DC

## 2017-05-31 NOTE — Telephone Encounter (Signed)
Called patient & gave her new dosage. She stated that she had not taken any steroids & couldn't understand why her blood sugars had ran so high. I asked her to call back on Monday & let us know how blood sugars had been over the weekend.

## 2017-06-05 ENCOUNTER — Other Ambulatory Visit: Payer: Self-pay

## 2017-06-05 ENCOUNTER — Telehealth: Payer: Self-pay | Admitting: Internal Medicine

## 2017-06-05 ENCOUNTER — Other Ambulatory Visit: Payer: Self-pay | Admitting: Primary Care

## 2017-06-05 ENCOUNTER — Telehealth: Payer: Self-pay | Admitting: Gastroenterology

## 2017-06-05 ENCOUNTER — Encounter: Admission: RE | Payer: Self-pay | Source: Ambulatory Visit

## 2017-06-05 ENCOUNTER — Ambulatory Visit
Admission: RE | Admit: 2017-06-05 | Payer: BLUE CROSS/BLUE SHIELD | Source: Ambulatory Visit | Admitting: Gastroenterology

## 2017-06-05 DIAGNOSIS — K625 Hemorrhage of anus and rectum: Secondary | ICD-10-CM

## 2017-06-05 SURGERY — COLONOSCOPY WITH PROPOFOL
Anesthesia: General

## 2017-06-05 MED ORDER — PEG 3350-KCL-NA BICARB-NACL 420 G PO SOLR: 4000.0000 mL | Freq: Once | ORAL | 0 refills | Status: AC

## 2017-06-05 NOTE — Progress Notes (Signed)
Spoke to patient. Rescheduled for 11/29  Sent information to patient via Mychart and email.   Ordered prep to pharmacy.

## 2017-06-05 NOTE — Telephone Encounter (Signed)
Patient states she can not get into her email and wants to know if paperwork for procedure can be printed out so she can pick it up. Patient also states prep needs to be sent to [facility]

## 2017-06-05 NOTE — Telephone Encounter (Signed)
Letter was faxed.

## 2017-06-05 NOTE — Telephone Encounter (Signed)
done

## 2017-06-05 NOTE — Telephone Encounter (Signed)
Received fax from [place] from [employer]. Placed in MW look at pile.

## 2017-06-05 NOTE — Telephone Encounter (Signed)
Patient needs to r/s her procedure that was supposed to be done this morning. States she never got her paperwork???

## 2017-06-06 ENCOUNTER — Other Ambulatory Visit: Payer: Self-pay

## 2017-06-06 MED ORDER — LINACLOTIDE 290 MCG PO CAPS: 290.0000 ug | ORAL_CAPSULE | Freq: Every day | ORAL | 0 refills | Status: DC

## 2017-06-07 DIAGNOSIS — I10 Essential (primary) hypertension: Secondary | ICD-10-CM | POA: Diagnosis not present

## 2017-06-07 DIAGNOSIS — I6523 Occlusion and stenosis of bilateral carotid arteries: Secondary | ICD-10-CM | POA: Diagnosis not present

## 2017-06-08 ENCOUNTER — Encounter
Admission: RE | Disposition: A | Discharge status: Home / Self Care | Payer: Self-pay | Source: Ambulatory Visit | Attending: Gastroenterology

## 2017-06-08 ENCOUNTER — Ambulatory Visit: Payer: BLUE CROSS/BLUE SHIELD | Admitting: Anesthesiology

## 2017-06-08 ENCOUNTER — Ambulatory Visit
Admission: RE | Admit: 2017-06-08 | Discharge: 2017-06-08 | Disposition: A | Discharge status: Home / Self Care | Payer: BLUE CROSS/BLUE SHIELD | Source: Ambulatory Visit | Attending: Gastroenterology | Admitting: Gastroenterology

## 2017-06-08 ENCOUNTER — Encounter: Payer: Self-pay | Admitting: Anesthesiology

## 2017-06-08 DIAGNOSIS — I10 Essential (primary) hypertension: Secondary | ICD-10-CM | POA: Diagnosis not present

## 2017-06-08 DIAGNOSIS — Z8673 Personal history of transient ischemic attack (TIA), and cerebral infarction without residual deficits: Secondary | ICD-10-CM | POA: Diagnosis not present

## 2017-06-08 DIAGNOSIS — Z5329 Procedure and treatment not carried out because of patient's decision for other reasons: Secondary | ICD-10-CM | POA: Diagnosis not present

## 2017-06-08 DIAGNOSIS — J449 Chronic obstructive pulmonary disease, unspecified: Secondary | ICD-10-CM | POA: Insufficient documentation

## 2017-06-08 DIAGNOSIS — E1151 Type 2 diabetes mellitus with diabetic peripheral angiopathy without gangrene: Secondary | ICD-10-CM | POA: Insufficient documentation

## 2017-06-08 DIAGNOSIS — K625 Hemorrhage of anus and rectum: Secondary | ICD-10-CM | POA: Diagnosis not present

## 2017-06-08 DIAGNOSIS — Z6841 Body Mass Index (BMI) 40.0 and over, adult: Secondary | ICD-10-CM | POA: Insufficient documentation

## 2017-06-08 DIAGNOSIS — I2510 Atherosclerotic heart disease of native coronary artery without angina pectoris: Secondary | ICD-10-CM | POA: Diagnosis not present

## 2017-06-08 DIAGNOSIS — F17200 Nicotine dependence, unspecified, uncomplicated: Secondary | ICD-10-CM | POA: Diagnosis not present

## 2017-06-08 DIAGNOSIS — G4730 Sleep apnea, unspecified: Secondary | ICD-10-CM | POA: Diagnosis not present

## 2017-06-08 LAB — GLUCOSE, CAPILLARY
Glucose-Capillary: 459 mg/dL — ABNORMAL HIGH (ref 65–99)
Glucose-Capillary: 468 mg/dL — ABNORMAL HIGH (ref 65–99)
Glucose-Capillary: 485 mg/dL — ABNORMAL HIGH (ref 65–99)

## 2017-06-08 LAB — GLUCOSE, RANDOM: Glucose, Bld: 469 mg/dL — ABNORMAL HIGH (ref 65–99)

## 2017-06-08 SURGERY — COLONOSCOPY WITH PROPOFOL
Anesthesia: General

## 2017-06-08 MED ORDER — INSULIN ASPART 100 UNIT/ML ~~LOC~~ SOLN: 20.0000 [IU] | Freq: Once | SUBCUTANEOUS | Status: DC

## 2017-06-08 MED ORDER — INSULIN ASPART 100 UNIT/ML ~~LOC~~ SOLN
SUBCUTANEOUS | Status: AC
Start: 2017-06-08 — End: 2017-06-08
  Administered 2017-06-08: 20 [IU]
  Filled 2017-06-08: qty 1

## 2017-06-08 MED ORDER — FENTANYL CITRATE (PF) 100 MCG/2ML IJ SOLN
INTRAMUSCULAR | Status: AC
  Filled 2017-06-08: qty 2

## 2017-06-08 MED ORDER — PROPOFOL 500 MG/50ML IV EMUL
INTRAVENOUS | Status: AC
  Filled 2017-06-08: qty 50

## 2017-06-08 MED ORDER — SODIUM CHLORIDE 0.9 % IV SOLN
INTRAVENOUS | Status: DC
  Administered 2017-06-08: 10:00:00 via INTRAVENOUS

## 2017-06-08 NOTE — Anesthesia Preprocedure Evaluation (Addendum)
Anesthesia Evaluation  Patient identified by MRN, date of birth, ID band Patient awake    Reviewed: Allergy & Precautions, NPO status , Patient's Chart, lab work & pertinent test results, reviewed documented beta blocker date and time   History of Anesthesia Complications (+) Family history of anesthesia reaction  Airway Mallampati: III  TM Distance: >3 FB     Dental  (+) Chipped   Pulmonary shortness of breath, asthma , sleep apnea , COPD, Current Smoker,           Cardiovascular hypertension, Pt. on medications + CAD, + Peripheral Vascular Disease, +CHF and + Orthopnea  + dysrhythmias      Neuro/Psych Seizures -,  PSYCHIATRIC DISORDERS Anxiety Bipolar Disorder TIA Neuromuscular disease CVA, No Residual Symptoms    GI/Hepatic GERD  ,  Endo/Other  diabetes, Type 2Morbid obesity  Renal/GU Renal disease     Musculoskeletal   Abdominal   Peds  Hematology   Anesthesia Other Findings No seizures for a long time. Has been running BS in the 400 range. Her low sugars are 300. Will give her 20u of novolog SQ to bring it in the upper 300 range. Does not use CPAP. Does use O2 at nite.  Reproductive/Obstetrics                            Anesthesia Physical Anesthesia Plan  ASA: III  Anesthesia Plan: General   Post-op Pain Management:    Induction: Intravenous  PONV Risk Score and Plan:   Airway Management Planned:   Additional Equipment:   Intra-op Plan:   Post-operative Plan:   Informed Consent: I have reviewed the patients History and Physical, chart, labs and discussed the procedure including the risks, benefits and alternatives for the proposed anesthesia with the patient or authorized representative who has indicated his/her understanding and acceptance.     Plan Discussed with: CRNA  Anesthesia Plan Comments:         Anesthesia Quick Evaluation

## 2017-06-08 NOTE — OR Nursing (Signed)
Fingerstick 485.  Dr. Marcello Moores aware.  Patient discharged - ambulatory per verbal orders of Dr. Marcello Moores.  Procedure cancelled because patient felt she was not clean out properly for procedure.

## 2017-06-08 NOTE — OR Nursing (Signed)
First finger stick was 459.  Repeat test was done in other hand and reading was 468.  Dr. Marcello Moores notified and stat serum glucose ordered from lab.

## 2017-06-12 ENCOUNTER — Other Ambulatory Visit: Payer: Self-pay | Admitting: Primary Care

## 2017-06-12 DIAGNOSIS — E1165 Type 2 diabetes mellitus with hyperglycemia: Secondary | ICD-10-CM

## 2017-06-13 ENCOUNTER — Ambulatory Visit: Payer: Medicare Other | Admitting: Primary Care

## 2017-06-13 ENCOUNTER — Encounter: Payer: Self-pay | Admitting: Primary Care

## 2017-06-13 ENCOUNTER — Other Ambulatory Visit: Payer: Self-pay | Admitting: Primary Care

## 2017-06-13 ENCOUNTER — Ambulatory Visit (INDEPENDENT_AMBULATORY_CARE_PROVIDER_SITE_OTHER): Payer: BLUE CROSS/BLUE SHIELD | Admitting: Primary Care

## 2017-06-13 VITALS — BP 120/70 | HR 102 | Temp 97.8°F | Ht 66.0 in | Wt 268.0 lb

## 2017-06-13 DIAGNOSIS — I5033 Acute on chronic diastolic (congestive) heart failure: Secondary | ICD-10-CM

## 2017-06-13 DIAGNOSIS — L03011 Cellulitis of right finger: Secondary | ICD-10-CM | POA: Diagnosis not present

## 2017-06-13 DIAGNOSIS — Z720 Tobacco use: Secondary | ICD-10-CM

## 2017-06-13 DIAGNOSIS — F319 Bipolar disorder, unspecified: Secondary | ICD-10-CM | POA: Diagnosis not present

## 2017-06-13 DIAGNOSIS — I10 Essential (primary) hypertension: Secondary | ICD-10-CM

## 2017-06-13 DIAGNOSIS — E1165 Type 2 diabetes mellitus with hyperglycemia: Secondary | ICD-10-CM | POA: Diagnosis not present

## 2017-06-13 DIAGNOSIS — Z23 Encounter for immunization: Secondary | ICD-10-CM | POA: Diagnosis not present

## 2017-06-13 DIAGNOSIS — I2510 Atherosclerotic heart disease of native coronary artery without angina pectoris: Secondary | ICD-10-CM | POA: Diagnosis not present

## 2017-06-13 MED ORDER — SULFAMETHOXAZOLE-TRIMETHOPRIM 800-160 MG PO TABS: 1.0000 | ORAL_TABLET | Freq: Two times a day (BID) | ORAL | 0 refills | Status: DC

## 2017-06-13 NOTE — Patient Instructions (Addendum)
Stop smoking! Use the patches as prescribed.  Continue to monitor your weight and report any gain of 3-5 pounds as discussed.   Please double check the medication list I provided you with against your bottles at home.  Please notify me of the new medication that your heart doctor started.  Please schedule a follow up appointment in 6 months.   It was a pleasure to see you today!

## 2017-06-13 NOTE — Telephone Encounter (Signed)
Pt called an stated her sugar was still running between 300-500.  Wanting to know if we could do anything for her, (to call in something per pt?)   Please advise.

## 2017-06-13 NOTE — Assessment & Plan Note (Signed)
Following with psychiatry.

## 2017-06-13 NOTE — Addendum Note (Signed)
Addended by: Jacqualin Combes on: 06/13/2017 04:18 PM   Modules accepted: Orders

## 2017-06-13 NOTE — Assessment & Plan Note (Signed)
Weights are stable. Continue current regimen. She will call us with the name of the newest medication per cardiology. Discussed when to report weight changes.

## 2017-06-13 NOTE — Assessment & Plan Note (Signed)
Patches helped with smoking cessation, she is not using now. Strongly advised she resume patches. She will call when needing refills.

## 2017-06-13 NOTE — Assessment & Plan Note (Signed)
Stable in the office today.

## 2017-06-13 NOTE — Assessment & Plan Note (Signed)
Following with endocrinology, regimen just increased today given elevated blood glucose readings. Continue current regimen.

## 2017-06-13 NOTE — Addendum Note (Signed)
Addended by: Pleas Koch on: 06/13/2017 03:13 PM   Modules accepted: Orders

## 2017-06-13 NOTE — Telephone Encounter (Signed)
Ok, please increase to 200 units with breakfast, 50 at lunch (if you eat lunch), and 170 with supper

## 2017-06-13 NOTE — Progress Notes (Addendum)
Subjective:    Patient ID: [name], female    DOB: [birth year], 46 y.o.   MRN: [id number]  HPI  [name] is a 46 year old female who presents today for follow up.  1) CHF/CAD/Essential Hypertension/Hyperlipidemia: Currently managed on Torsemide 30 mg once daily. She is no longer taking metalazone 2.5 mg. She was placed on another medication by her cardiologist but she cannot remember. She is monitoring her weight daily and denies any fluctuations. She thinks this new medication is working better. She continues to smoke.   Wt Readings from Last 3 Encounters:  06/13/17 268 lb (121.6 kg)  06/08/17 267 lb (121.1 kg)  05/25/17 274 lb 6.4 oz (124.5 kg)     2) Bipolar Disorder: Currently managed on buspirone 30 mg BID, fluoxetine 40 mg, Seroquel XR 400 mg, alprazolam 1 mg every other day. Currently following with psychiatry.   3) Type 2 Diabetes: Farxiga 10 mg, metformin 1000 mg BID, Victoza once daily, Humulin R-U 500 units/ml and is injecting 200 units-50 units-170 units three times daily before meals. Her regimen was changed today given blood sugar readings of 300-500. Her next appointment is in a few weeks.   4) Chronic Constipation/IBS: Currently following with GI and is managed on Linzess, Amitiza, plecanatide 3 mg. She's feeling much better.   5) Tobacco Abuse: Currently managed on nicotine patches. These help when she uses them. She has not used them since Thanksgiving as she wishes to continue to smoke.   6) Finger Erythema: Located to right middle finger since after Thanksgiving. She accidentally burned her finger on Thanksgiving day, this caused blistering. Several days later her brother popped the blister with release of clear fluid. Since then she's noticed erythema spreading down her finger. She denies fevers.   Review of Systems  Constitutional: Negative for unexpected weight change.  Eyes: Negative for visual disturbance.  Respiratory: Negative for shortness of breath.    Cardiovascular: Negative for chest pain.  Gastrointestinal:       Constipation improved  Neurological: Negative for dizziness.  Psychiatric/Behavioral:       Following with psychiatry.       Past Medical History:  Diagnosis Date  . Anxiety    takes Prozac daily  . Anxiety   . Aortic valve calcification   . Asthma    Advair and Spirva daily  . Asthma   . Bipolar disorder ([place])   . CAD (coronary artery disease)    a. LHC 11/2013 done for CP/fluid retention: mild disease in prox LAD, mild-mod disease in mRCA, EF 60% with normal LVEDP. b. Normal nuc 03/2016.  Marland Kitchen CHF (congestive heart failure) ([place])   . Chronic diastolic CHF (congestive heart failure) ([place])   . CKD (chronic kidney disease), stage II   . COPD (chronic obstructive pulmonary disease) (HCC)    a. nocturnal O2.  Marland Kitchen COPD (chronic obstructive pulmonary disease) ([facility])   . Coronary artery disease   . Diabetes mellitus   . Diabetes mellitus without complication ([place])   . Dyspnea   . Family history of adverse reaction to anesthesia    mom gets nauseated  . GERD (gastroesophageal reflux disease)    takes Pepcid daily  . History of blood clots    left leg 3-72yrs ago  . Hyperlipidemia   . Hypertension   . Hypertriglyceridemia   . Inguinal hernia, left 01/2015  . Muscle spasm   . Peripheral neuropathy   . RBBB   . Seizures ([place])   .  Sinus tachycardia    a. persistent since 2009.  Marland Kitchen Smokers' cough ([place])   . Stroke [facility]) 1989   left sided weakness  . TIA (transient ischemic attack)   . Tobacco abuse      Social History   Socioeconomic History  . Marital status: Married    Spouse name: Not on file  . Number of children: Not on file  . Years of education: Not on file  . Highest education level: Not on file  Social Needs  . Financial resource strain: Not on file  . Food insecurity - worry: Not on file  . Food insecurity - inability: Not on file  . Transportation needs - medical: Not on file  . Transportation  needs - non-medical: Not on file  Occupational History  . Not on file  Tobacco Use  . Smoking status: Current Some Day Smoker    Packs/day: 0.50    Years: 30.00    Pack years: 15.00    Types: Cigarettes    Last attempt to quit: 01/08/2017    Years since quitting: 0.4  . Smokeless tobacco: Never Used  Substance and Sexual Activity  . Alcohol use: Yes  . Drug use: No  . Sexual activity: No  Other Topics Concern  . Not on file  Social History Narrative   ** Merged History Encounter **       Lives in [place]   Married but lives with Boyfriend - not legally separated   Disabled - for BiPolar, Seizure disorder, diabetes   Formerly worked at [employer]          Past Surgical History:  Procedure Laterality Date  . HERNIA REPAIR    . INGUINAL HERNIA REPAIR Left 04/08/2015   Procedure: OPEN LEFT INGUINAL HERNIA REPAIR WITH MESH;  Surgeon: Ralene Ok, MD;  Location: [place];  Service: General;  Laterality: Left;  . INSERTION OF MESH Left 04/08/2015   Procedure: INSERTION OF MESH;  Surgeon: Ralene Ok, MD;  Location: [place];  Service: General;  Laterality: Left;  . LAPAROSCOPY     Endometriosis  . LEFT HEART CATHETERIZATION WITH CORONARY ANGIOGRAM N/A 12/05/2013   Procedure: LEFT HEART CATHETERIZATION WITH CORONARY ANGIOGRAM;  Surgeon: Jettie Booze, MD;  Location[facility] CATH LAB;  Service: Cardiovascular;  Laterality: N/A;  . right kidney drained    . TEE WITHOUT CARDIOVERSION N/A 11/28/2013   Procedure: TRANSESOPHAGEAL ECHOCARDIOGRAM (TEE);  Surgeon: Thayer Headings, MD;  Location: [facility] ENDOSCOPY;  Service: Cardiovascular;  Laterality: N/A;    Family History  Problem Relation Age of Onset  . Venous thrombosis Brother   . Other Brother        BRAIN TUMOR  . Asthma Father   . Diabetes Father   . Coronary artery disease Mother   . Hypertension Mother   . Diabetes Mother   . Asthma Sister   . Diabetes type II Brother     Allergies  Allergen Reactions  . Metoprolol  Shortness Of Breath    Occurrence of shortness of breath after 3 days  . Montelukast Shortness Of Breath  . Morphine Sulfate Shortness Of Breath and Nausea And Vomiting    Swollen Throat - Able to tolerate dilaudid  . Penicillins Hives and Shortness Of Breath    Swelling on throat Has patient had a PCN reaction causing immediate rash, facial/tongue/throat swelling, SOB or lightheadedness with hypotension: Yes Has patient had a PCN reaction causing severe rash involving mucus membranes or skin necrosis: No Has patient  had a PCN reaction that required hospitalization Yes Has patient had a PCN reaction occurring within the last 10 years: No If all of the above answers are "NO", then may proceed with Cephalosporin use.   . Prednisone Anaphylaxis  . Diltiazem Swelling  . Gabapentin Swelling    Current Outpatient Medications on File Prior to Visit  Medication Sig Dispense Refill  . acetaminophen (TYLENOL) 325 MG tablet Take 2 tablets (650 mg total) by mouth every 4 (four) hours as needed for headache or mild pain. 30 tablet 2  . albuterol (PROVENTIL HFA;VENTOLIN HFA) 108 (90 Base) MCG/ACT inhaler Inhale 2 puffs into the lungs every 4 (four) hours as needed for wheezing or shortness of breath.    . ALPRAZolam (XANAX) 1 MG tablet Take 1 mg by mouth at bedtime as needed for anxiety.    Marland Kitchen aspirin EC 81 MG tablet Take 81 mg by mouth every morning.    Marland Kitchen atorvastatin (LIPITOR) 40 MG tablet Take 40 mg by mouth daily.    . budesonide-formoterol (SYMBICORT) 80-4.5 MCG/ACT inhaler Take 2 puffs first thing in am and then another 2 puffs about 12 hours later. (Patient taking differently: Inhale 2 puffs into the lungs 2 (two) times daily. ) 1 Inhaler 11  . busPIRone (BUSPAR) 30 MG tablet Take 1 tablet (30 mg total) by mouth 2 (two) times daily. 60 tablet 0  . FARXIGA 10 MG TABS tablet Take 10 mg daily by mouth.  4  . FLUoxetine (PROZAC) 40 MG capsule Take 40 mg by mouth daily.    Marland Kitchen ibuprofen (ADVIL,MOTRIN)  800 MG tablet TAKE 1 TABLET BY MOUTH 3 TIMES A DAY WITH MEALS  5  . insulin regular human CONCENTRATED (HUMULIN R U-500 KWIKPEN) 500 UNIT/ML kwikpen 3 times a day (just before each meal) 120-30-150 units, and pen needles 3/day 6 pen 11  . linaclotide (LINZESS) 290 MCG CAPS capsule Take 1 capsule (290 mcg total) by mouth daily before breakfast. 90 capsule 0  . liraglutide (VICTOZA) 18 MG/3ML SOPN Inject 1.8 mg into the skin every morning.    . lubiprostone (AMITIZA) 24 MCG capsule Take 1 capsule (24 mcg total) by mouth 2 (two) times daily with a meal. 60 capsule 3  . metFORMIN (GLUCOPHAGE) 1000 MG tablet TAKE 1 TABLET BY MOUTH TWICE A DAY 180 tablet 1  . nicotine (NICODERM CQ - DOSED IN MG/24 HOURS) 21 mg/24hr patch Place 1 patch (21 mg total) at bedtime onto the skin. 28 patch 1  . ondansetron (ZOFRAN) 4 MG tablet Take 4 mg by mouth every 6 (six) hours as needed for nausea or vomiting.    . OXYGEN Inhale 3 L into the lungs at bedtime.     Marland Kitchen Plecanatide (TRULANCE) 3 MG TABS Take 3 mg every other day by mouth. 30 tablet 3  . pregabalin (LYRICA) 300 MG capsule Take 1 capsule (300 mg total) by mouth 2 (two) times daily. 180 capsule 0  . PROVENTIL HFA 108 (90 Base) MCG/ACT inhaler INHALE TWO (2) PUFFS EVERY FOUR HOURS ASNEEDED FOR SHORTNESS OF BREATH 6.7 g 2  . QUEtiapine (SEROQUEL XR) 400 MG 24 hr tablet Take 800 mg by mouth at bedtime.    . ranitidine (ZANTAC) 150 MG tablet Take 150 mg by mouth daily.     Marland Kitchen rOPINIRole (REQUIP) 0.5 MG tablet TAKE ONE TABLET BY MOUTH EVERY NIGHT AT BEDTIME 90 tablet 0  . torsemide (DEMADEX) 10 MG tablet Take 3 tablets by mouth once daily for swelling and  fluid retention. 90 tablet 0  . Vitamin D, Ergocalciferol, (DRISDOL) 50000 units CAPS capsule Take 50,000 Units by mouth every 7 (seven) days. Takes each Wednesday before breakfast weekly.      No current facility-administered medications on file prior to visit.     BP 120/70   Pulse (!) 102   Temp 97.8 F (36.6  C) (Oral)   Ht 5\' 6"  (1.676 m)   Wt 268 lb (121.6 kg)   LMP 11/11/2011   SpO2 92%   BMI 43.26 kg/m    Objective:   Physical Exam  Constitutional: She appears well-nourished.  Neck: Neck supple.  Cardiovascular: Normal rate and regular rhythm.  Pulmonary/Chest: Effort normal and breath sounds normal.  Skin: Skin is warm and dry.  Mildly darkened area to tip of right middle finger. Also with surrounding erythema with streaking towards hand. No drainage.  Psychiatric: She has a normal mood and affect.          Assessment & Plan:  Cellulitis:  Located to right middle finger, mild darkened area. Treat with course of Bactrim DS x 7 days. Strict return precautions. Consider wound evaluation.  Sheral Flow, NP  She will update if no improvement in 3-4 days. She will also update if the darkened area does not resolve. Concerning for necrosis.

## 2017-06-13 NOTE — Telephone Encounter (Signed)
Called and notified patient. She stated she would try new dose & see if her blood sugars come down.

## 2017-06-13 NOTE — Assessment & Plan Note (Signed)
Strongly advised that she quit smoking, advised to resume use of the patches. Continue aspirin daily.

## 2017-06-13 NOTE — Telephone Encounter (Signed)
Patient states that she is taking 170 units with breakfast because we increased it last time she called. She takes none at lunch because she is usually asleep. The she takes the 150 with supper.

## 2017-06-13 NOTE — Telephone Encounter (Signed)
Please verify U-500 insulin is 120 units with breakfast, 30 units with lunch, and 150 units with supper.  Then please increase to 140 units with breakfast, 50 units with lunch, and 170 units with supper. I'll see you next week, as scheduled

## 2017-06-15 ENCOUNTER — Other Ambulatory Visit: Payer: Self-pay | Admitting: Internal Medicine

## 2017-06-19 ENCOUNTER — Ambulatory Visit: Payer: BLUE CROSS/BLUE SHIELD | Admitting: Endocrinology

## 2017-06-22 DIAGNOSIS — I10 Essential (primary) hypertension: Secondary | ICD-10-CM | POA: Diagnosis not present

## 2017-06-22 DIAGNOSIS — F17200 Nicotine dependence, unspecified, uncomplicated: Secondary | ICD-10-CM | POA: Diagnosis not present

## 2017-06-22 DIAGNOSIS — F410 Panic disorder [episodic paroxysmal anxiety] without agoraphobia: Secondary | ICD-10-CM | POA: Diagnosis not present

## 2017-06-22 DIAGNOSIS — I5030 Unspecified diastolic (congestive) heart failure: Secondary | ICD-10-CM | POA: Diagnosis not present

## 2017-06-22 DIAGNOSIS — F4322 Adjustment disorder with anxiety: Secondary | ICD-10-CM | POA: Diagnosis not present

## 2017-06-23 ENCOUNTER — Telehealth: Payer: Self-pay

## 2017-06-23 NOTE — Telephone Encounter (Signed)
Left vm requesting patient call us back to reschedule the appointment we canceled on 12/10 due to the weather

## 2017-06-28 ENCOUNTER — Ambulatory Visit (INDEPENDENT_AMBULATORY_CARE_PROVIDER_SITE_OTHER): Payer: BLUE CROSS/BLUE SHIELD | Admitting: Endocrinology

## 2017-06-28 ENCOUNTER — Encounter: Payer: Self-pay | Admitting: Endocrinology

## 2017-06-28 VITALS — BP 122/78 | HR 112 | Wt 276.8 lb

## 2017-06-28 DIAGNOSIS — E1165 Type 2 diabetes mellitus with hyperglycemia: Secondary | ICD-10-CM | POA: Diagnosis not present

## 2017-06-28 DIAGNOSIS — I2510 Atherosclerotic heart disease of native coronary artery without angina pectoris: Secondary | ICD-10-CM

## 2017-06-28 MED ORDER — INSULIN REGULAR HUMAN (CONC) 500 UNIT/ML ~~LOC~~ SOPN: PEN_INJECTOR | SUBCUTANEOUS | 11 refills | Status: DC

## 2017-06-28 NOTE — Patient Instructions (Addendum)
check your blood sugar twice a day.  vary the time of day when you check, between before the 3 meals, and at bedtime.  also check if you have symptoms of your blood sugar being too high or too low.  please keep a record of the readings and bring it to your next appointment here (or you can bring the meter itself).  You can write it on any piece of paper.  please call us sooner if your blood sugar goes below 70, or if you have a lot of readings over 200. For now, please: Increase the U-500 to 250 units with breakfast, 60 units with lunch, and 220 units with supper, and:  Please call or message [place] next week, to tell us how the blood sugar is doing.   Please come back for a follow-up appointment in 1 month.

## 2017-06-28 NOTE — Progress Notes (Signed)
Subjective:    Patient ID: [name], female    DOB: [birth year], 46 y.o.   MRN: [id number]  HPI Pt returns for f/u of diabetes mellitus: DM type: Insulin-requiring type 2 Dx'ed: 1988.  Complications: polyneuropathy, TIA, renal insuff, and DR Therapy: insulin since soon after dx (also takes victoza, metformin, and farxiga) GDM: never DKA: never Severe hypoglycemia: last episode was mid-2018 Pancreatitis: never Pancreatic imaging: normal on 2011 CT Other: she takes multiple daily injections; she takes U-500, due to severe insulin resistance Interval history: no cbg record, but states cbg's vary from 300-500.  pt states she feels well in general. Past Medical History:  Diagnosis Date  . Anxiety    takes Prozac daily  . Anxiety   . Aortic valve calcification   . Asthma    Advair and Spirva daily  . Asthma   . Bipolar disorder ([place])   . CAD (coronary artery disease)    a. LHC 11/2013 done for CP/fluid retention: mild disease in prox LAD, mild-mod disease in mRCA, EF 60% with normal LVEDP. b. Normal nuc 03/2016.  Marland Kitchen CHF (congestive heart failure) ([place])   . Chronic diastolic CHF (congestive heart failure) ([place])   . CKD (chronic kidney disease), stage II   . COPD (chronic obstructive pulmonary disease) (H[facility])    a. nocturnal O2.  Marland Kitchen COPD (chronic obstructive pulmonary disease) ([place])   . Coronary artery disease   . Diabetes mellitus   . Diabetes mellitus without complication ([place])   . Dyspnea   . Family history of adverse reaction to anesthesia    mom gets nauseated  . GERD (gastroesophageal reflux disease)    takes Pepcid daily  . History of blood clots    left leg 3-18yrs ago  . Hyperlipidemia   . Hypertension   . Hypertriglyceridemia   . Inguinal hernia, left 01/2015  . Muscle spasm   . Peripheral neuropathy   . RBBB   . Seizures ([place])   . Sinus tachycardia    a. persistent since 2009.  Marland Kitchen Smokers' cough ([place])   . Stroke [facility]) 1989   left sided weakness  . TIA  (transient ischemic attack)   . Tobacco abuse     Past Surgical History:  Procedure Laterality Date  . HERNIA REPAIR    . INGUINAL HERNIA REPAIR Left 04/08/2015   Procedure: OPEN LEFT INGUINAL HERNIA REPAIR WITH MESH;  Surgeon: Ralene Ok, MD;  Location: [place];  Service: General;  Laterality: Left;  . INSERTION OF MESH Left 04/08/2015   Procedure: INSERTION OF MESH;  Surgeon: Ralene Ok, MD;  Location: [place];  Service: General;  Laterality: Left;  . LAPAROSCOPY     Endometriosis  . LEFT HEART CATHETERIZATION WITH CORONARY ANGIOGRAM N/A 12/05/2013   Procedure: LEFT HEART CATHETERIZATION WITH CORONARY ANGIOGRAM;  Surgeon: Jettie Booze, MD;  Location: [facility] CATH LAB;  Service: Cardiovascular;  Laterality: N/A;  . right kidney drained    . TEE WITHOUT CARDIOVERSION N/A 11/28/2013   Procedure: TRANSESOPHAGEAL ECHOCARDIOGRAM (TEE);  Surgeon: Thayer Headings, MD;  Location: [facility] ENDOSCOPY;  Service: Cardiovascular;  Laterality: N/A;    Social History   Socioeconomic History  . Marital status: Married    Spouse name: Not on file  . Number of children: Not on file  . Years of education: Not on file  . Highest education level: Not on file  Social Needs  . Financial resource strain: Not on file  . Food insecurity - worry: Not on file  .  Food insecurity - inability: Not on file  . Transportation needs - medical: Not on file  . Transportation needs - non-medical: Not on file  Occupational History  . Not on file  Tobacco Use  . Smoking status: Current Some Day Smoker    Packs/day: 0.50    Years: 30.00    Pack years: 15.00    Types: Cigarettes    Last attempt to quit: 01/08/2017    Years since quitting: 0.4  . Smokeless tobacco: Never Used  Substance and Sexual Activity  . Alcohol use: Yes  . Drug use: No  . Sexual activity: No  Other Topics Concern  . Not on file  Social History Narrative   ** Merged History Encounter **       Lives in [place]   Married but lives with  Boyfriend - not legally separated   Disabled - for BiPolar, Seizure disorder, diabetes   Formerly worked at [employer]          Current Outpatient Medications on File Prior to Visit  Medication Sig Dispense Refill  . acetaminophen (TYLENOL) 325 MG tablet Take 2 tablets (650 mg total) by mouth every 4 (four) hours as needed for headache or mild pain. 30 tablet 2  . albuterol (PROVENTIL HFA;VENTOLIN HFA) 108 (90 Base) MCG/ACT inhaler Inhale 2 puffs into the lungs every 4 (four) hours as needed for wheezing or shortness of breath.    . ALPRAZolam (XANAX) 1 MG tablet Take 1 mg by mouth at bedtime as needed for anxiety.    Marland Kitchen aspirin EC 81 MG tablet Take 81 mg by mouth every morning.    Marland Kitchen atorvastatin (LIPITOR) 40 MG tablet Take 40 mg by mouth daily.    . budesonide-formoterol (SYMBICORT) 80-4.5 MCG/ACT inhaler INHALE 2 PUFFS EVERY 12 HOURS TO PREVENTCOUGH OR WHEEZING--RINSE, GARGLE, & SPITAFTER EACH USE. 10.2 g 4  . busPIRone (BUSPAR) 30 MG tablet Take 1 tablet (30 mg total) by mouth 2 (two) times daily. 60 tablet 0  . FARXIGA 10 MG TABS tablet Take 10 mg daily by mouth.  4  . FLUoxetine (PROZAC) 40 MG capsule Take 40 mg by mouth daily.    Marland Kitchen ibuprofen (ADVIL,MOTRIN) 800 MG tablet TAKE 1 TABLET BY MOUTH 3 TIMES A DAY WITH MEALS  5  . linaclotide (LINZESS) 290 MCG CAPS capsule Take 1 capsule (290 mcg total) by mouth daily before breakfast. 90 capsule 0  . liraglutide (VICTOZA) 18 MG/3ML SOPN Inject 1.8 mg into the skin every morning.    . lubiprostone (AMITIZA) 24 MCG capsule Take 1 capsule (24 mcg total) by mouth 2 (two) times daily with a meal. 60 capsule 3  . metFORMIN (GLUCOPHAGE) 1000 MG tablet TAKE 1 TABLET BY MOUTH TWICE A DAY 180 tablet 1  . nicotine (NICODERM CQ - DOSED IN MG/24 HOURS) 21 mg/24hr patch Place 1 patch (21 mg total) at bedtime onto the skin. 28 patch 1  . ondansetron (ZOFRAN) 4 MG tablet Take 4 mg by mouth every 6 (six) hours as needed for nausea or vomiting.    . OXYGEN Inhale 3  L into the lungs at bedtime.     Marland Kitchen Plecanatide (TRULANCE) 3 MG TABS Take 3 mg every other day by mouth. 30 tablet 3  . pregabalin (LYRICA) 300 MG capsule Take 1 capsule (300 mg total) by mouth 2 (two) times daily. 180 capsule 0  . PROVENTIL HFA 108 (90 Base) MCG/ACT inhaler INHALE TWO (2) PUFFS EVERY FOUR HOURS ASNEEDED FOR SHORTNESS OF  BREATH 6.7 g 2  . QUEtiapine (SEROQUEL XR) 400 MG 24 hr tablet Take 800 mg by mouth at bedtime.    . ranitidine (ZANTAC) 150 MG tablet Take 150 mg by mouth daily.     Marland Kitchen rOPINIRole (REQUIP) 0.5 MG tablet TAKE ONE TABLET BY MOUTH EVERY NIGHT AT BEDTIME 90 tablet 0  . sulfamethoxazole-trimethoprim (BACTRIM DS,SEPTRA DS) 800-160 MG tablet Take 1 tablet by mouth 2 (two) times daily. 14 tablet 0  . torsemide (DEMADEX) 10 MG tablet Take 3 tablets by mouth once daily for swelling and fluid retention. 90 tablet 0  . Vitamin D, Ergocalciferol, (DRISDOL) 50000 units CAPS capsule Take 50,000 Units by mouth every 7 (seven) days. Takes each Wednesday before breakfast weekly.      No current facility-administered medications on file prior to visit.     Allergies  Allergen Reactions  . Metoprolol Shortness Of Breath    Occurrence of shortness of breath after 3 days  . Montelukast Shortness Of Breath  . Morphine Sulfate Shortness Of Breath and Nausea And Vomiting    Swollen Throat - Able to tolerate dilaudid  . Penicillins Hives and Shortness Of Breath    Swelling on throat Has patient had a PCN reaction causing immediate rash, facial/tongue/throat swelling, SOB or lightheadedness with hypotension: Yes Has patient had a PCN reaction causing severe rash involving mucus membranes or skin necrosis: No Has patient had a PCN reaction that required hospitalization Yes Has patient had a PCN reaction occurring within the last 10 years: No If all of the above answers are "NO", then may proceed with Cephalosporin use.   . Prednisone Anaphylaxis  . Diltiazem Swelling  . Gabapentin  Swelling    Family History  Problem Relation Age of Onset  . Venous thrombosis Brother   . Other Brother        BRAIN TUMOR  . Asthma Father   . Diabetes Father   . Coronary artery disease Mother   . Hypertension Mother   . Diabetes Mother   . Asthma Sister   . Diabetes type II Brother     BP 122/78 (BP Location: Right Arm, Patient Position: Sitting, Cuff Size: Normal)   Pulse (!) 112   Wt 276 lb 12.8 oz (125.6 kg)   LMP 11/11/2011   SpO2 96%   BMI 44.68 kg/m    Review of Systems She denies hypoglycemia    Objective:   Physical Exam VITAL SIGNS:  See vs page GENERAL: no distress Pulses: dorsalis pedis intact bilat.   MSK: no deformity of the feet CV: 1+ bilat leg edema Skin:  no ulcer on the feet, but the skin is dry.  normal color and temp on the feet. Neuro: sensation is intact to touch on the feet, but decreased from normal Ext: There is bilateral onychomycosis of the toenails.      Assessment & Plan:  Insulin-requiring type 2 DM, with DR: she needs increased rx   Patient Instructions  check your blood sugar twice a day.  vary the time of day when you check, between before the 3 meals, and at bedtime.  also check if you have symptoms of your blood sugar being too high or too low.  please keep a record of the readings and bring it to your next appointment here (or you can bring the meter itself).  You can write it on any piece of paper.  please call us sooner if your blood sugar goes below 70, or if you have  a lot of readings over 200. For now, please: Increase the U-500 to 250 units with breakfast, 60 units with lunch, and 220 units with supper, and:  Please call or message [place] next week, to tell us how the blood sugar is doing.   Please come back for a follow-up appointment in 1 month.

## 2017-07-07 ENCOUNTER — Telehealth: Payer: Self-pay | Admitting: Primary Care

## 2017-07-07 DIAGNOSIS — E1142 Type 2 diabetes mellitus with diabetic polyneuropathy: Secondary | ICD-10-CM

## 2017-07-07 DIAGNOSIS — G629 Polyneuropathy, unspecified: Secondary | ICD-10-CM

## 2017-07-07 NOTE — Telephone Encounter (Signed)
I received a fax from [facility] in [place] [phone] requesting a signature for medication changes.  1. Her insulin has updated in the system, see new Rx in med list. 2. I don't have record of the potassium, spironolactone, or trazodone. Please inquire.  3. She is also on numerous medications, do they have that on file?

## 2017-07-10 NOTE — Telephone Encounter (Signed)
Message left for patient to return my call.  

## 2017-07-13 ENCOUNTER — Other Ambulatory Visit: Payer: Self-pay | Admitting: Primary Care

## 2017-07-13 DIAGNOSIS — E781 Pure hyperglyceridemia: Secondary | ICD-10-CM

## 2017-07-13 DIAGNOSIS — G629 Polyneuropathy, unspecified: Secondary | ICD-10-CM

## 2017-07-14 DIAGNOSIS — H43813 Vitreous degeneration, bilateral: Secondary | ICD-10-CM | POA: Diagnosis not present

## 2017-07-14 DIAGNOSIS — H4311 Vitreous hemorrhage, right eye: Secondary | ICD-10-CM | POA: Diagnosis not present

## 2017-07-14 DIAGNOSIS — E113513 Type 2 diabetes mellitus with proliferative diabetic retinopathy with macular edema, bilateral: Secondary | ICD-10-CM | POA: Diagnosis not present

## 2017-07-14 NOTE — Telephone Encounter (Signed)
Spoke with Allie Bossier , NP: patient needs a full medication reconciliation before we can move forward with med refill management.  Vallarie Mare (CMA) has reached out to patient to have her come in with bottles for a review.

## 2017-07-14 NOTE — Telephone Encounter (Deleted)
Not sure why these refills are still in the request box but will pend for Allie Bossier, NP to review as she has not filled this before for patient.  Anda Kraft,  Please advise if okay to fill?

## 2017-07-18 NOTE — Telephone Encounter (Signed)
Noted, thank you

## 2017-07-18 NOTE — Telephone Encounter (Signed)
Is her psychiatrist prescribing the Lyrica? If not then we will need to try to reduce the dose. Let me know.

## 2017-07-18 NOTE — Telephone Encounter (Signed)
I spoke with patient.  She is going to meet with me tomorrow 07/19/17 at 3pm for medication reconciliation.  She was instructed to bring anything and everything that she takes with her in a bag for [place] to review.  Patient verbalized understanding and agrees to see me tomorrow. We will address all questions regarding recent refill requests at that time.

## 2017-07-19 ENCOUNTER — Telehealth: Payer: Self-pay

## 2017-07-19 ENCOUNTER — Emergency Department (HOSPITAL_COMMUNITY): Payer: BLUE CROSS/BLUE SHIELD

## 2017-07-19 ENCOUNTER — Encounter (HOSPITAL_COMMUNITY): Payer: Self-pay | Admitting: Emergency Medicine

## 2017-07-19 ENCOUNTER — Other Ambulatory Visit: Payer: Self-pay

## 2017-07-19 ENCOUNTER — Ambulatory Visit: Payer: BLUE CROSS/BLUE SHIELD

## 2017-07-19 VITALS — BP 106/68 | HR 100 | Resp 12 | Wt 299.0 lb

## 2017-07-19 DIAGNOSIS — Z794 Long term (current) use of insulin: Secondary | ICD-10-CM | POA: Diagnosis not present

## 2017-07-19 DIAGNOSIS — E1122 Type 2 diabetes mellitus with diabetic chronic kidney disease: Secondary | ICD-10-CM | POA: Diagnosis not present

## 2017-07-19 DIAGNOSIS — Z7982 Long term (current) use of aspirin: Secondary | ICD-10-CM | POA: Insufficient documentation

## 2017-07-19 DIAGNOSIS — I130 Hypertensive heart and chronic kidney disease with heart failure and stage 1 through stage 4 chronic kidney disease, or unspecified chronic kidney disease: Secondary | ICD-10-CM | POA: Diagnosis not present

## 2017-07-19 DIAGNOSIS — I5032 Chronic diastolic (congestive) heart failure: Secondary | ICD-10-CM | POA: Diagnosis not present

## 2017-07-19 DIAGNOSIS — N182 Chronic kidney disease, stage 2 (mild): Secondary | ICD-10-CM | POA: Diagnosis not present

## 2017-07-19 DIAGNOSIS — Z5321 Procedure and treatment not carried out due to patient leaving prior to being seen by health care provider: Secondary | ICD-10-CM

## 2017-07-19 DIAGNOSIS — I110 Hypertensive heart disease with heart failure: Secondary | ICD-10-CM | POA: Diagnosis not present

## 2017-07-19 DIAGNOSIS — I2510 Atherosclerotic heart disease of native coronary artery without angina pectoris: Secondary | ICD-10-CM | POA: Diagnosis not present

## 2017-07-19 DIAGNOSIS — F17210 Nicotine dependence, cigarettes, uncomplicated: Secondary | ICD-10-CM | POA: Diagnosis not present

## 2017-07-19 DIAGNOSIS — I509 Heart failure, unspecified: Secondary | ICD-10-CM | POA: Diagnosis not present

## 2017-07-19 DIAGNOSIS — R0602 Shortness of breath: Secondary | ICD-10-CM

## 2017-07-19 DIAGNOSIS — Z79899 Other long term (current) drug therapy: Secondary | ICD-10-CM | POA: Insufficient documentation

## 2017-07-19 DIAGNOSIS — I5033 Acute on chronic diastolic (congestive) heart failure: Secondary | ICD-10-CM

## 2017-07-19 LAB — URINALYSIS, ROUTINE W REFLEX MICROSCOPIC
Bilirubin Urine: NEGATIVE
Glucose, UA: >=500 mg/dL — AB
Ketones, ur: NEGATIVE mg/dL
Leukocytes, UA: NEGATIVE
Nitrite: NEGATIVE
Protein, ur: NEGATIVE mg/dL
Specific Gravity, Urine: 1.005 (ref 1.005–1.030)
pH: 5 (ref 5.0–8.0)

## 2017-07-19 LAB — COMPREHENSIVE METABOLIC PANEL
ALT: 23 U/L (ref 14–54)
AST: 20 U/L (ref 15–41)
Albumin: 3.6 g/dL (ref 3.5–5.0)
Alkaline Phosphatase: 151 U/L — ABNORMAL HIGH (ref 38–126)
Anion gap: 13 (ref 5–15)
BUN: 28 mg/dL — ABNORMAL HIGH (ref 6–20)
CO2: 28 mmol/L (ref 22–32)
Calcium: 9.7 mg/dL (ref 8.9–10.3)
Chloride: 92 mmol/L — ABNORMAL LOW (ref 101–111)
Creatinine, Ser: 1.38 mg/dL — ABNORMAL HIGH (ref 0.44–1.00)
GFR calc Af Amer: 51 mL/min — ABNORMAL LOW (ref >60)
GFR calc non Af Amer: 44 mL/min — ABNORMAL LOW (ref >60)
Glucose, Bld: 405 mg/dL — ABNORMAL HIGH (ref 65–99)
Potassium: 3.6 mmol/L (ref 3.5–5.1)
Sodium: 133 mmol/L — ABNORMAL LOW (ref 135–145)
Total Bilirubin: 0.9 mg/dL (ref 0.3–1.2)
Total Protein: 8.7 g/dL — ABNORMAL HIGH (ref 6.5–8.1)

## 2017-07-19 LAB — CBC
HCT: 37 % (ref 36.0–46.0)
Hemoglobin: 11.4 g/dL — ABNORMAL LOW (ref 12.0–15.0)
MCH: 27.3 pg (ref 26.0–34.0)
MCHC: 30.8 g/dL (ref 30.0–36.0)
MCV: 88.7 fL (ref 78.0–100.0)
Platelets: 218 10*3/uL (ref 150–400)
RBC: 4.17 MIL/uL (ref 3.87–5.11)
RDW: 18 % — ABNORMAL HIGH (ref 11.5–15.5)
WBC: 13.6 10*3/uL — ABNORMAL HIGH (ref 4.0–10.5)

## 2017-07-19 LAB — LIPASE, BLOOD: Lipase: 82 U/L — ABNORMAL HIGH (ref 11–51)

## 2017-07-19 LAB — I-STAT BETA HCG BLOOD, ED (MC, WL, AP ONLY): I-stat hCG, quantitative: 7.1 m[IU]/mL — ABNORMAL HIGH (ref <5)

## 2017-07-19 NOTE — Telephone Encounter (Signed)
Refills sent to pharmacy. 

## 2017-07-19 NOTE — Telephone Encounter (Signed)
Copied from [place] [id number]. Topic: Inquiry >> Jul 19, 2017 12:33 PM Oliver Pila B wrote: Reason for CRM: [name] from [facility] called on behalf of the pt and states the pt is having new abdomen pain, high blood sugar, and weight gain and some of the issues need to be addressed real soon, the pt has a nurse appt today but the nurse is wondering if she could be worked in today, Diplomatic Services operational officer @ [phone]

## 2017-07-19 NOTE — ED Triage Notes (Signed)
Pt to ED from home with worsening SOB - pt has CHF and COPD, on 4L O2 [place] when sleeping, not on oxygen otherwise. Patient's palliative care nurse stated that patient may need to be on oxygen all of the time. 14lb. Weight gain since Sunday. Pt was at PCP office and RA sat in the 80s. Patient SpO2 98% on RA in triage. Patient also endorses new R sided abdominal pain x 2 days that is worse with movement, denies N/V/D, denies cough, no fevers or chills. Resp e/u in triage, crackles heard bilaterally. Skin warm/dry.

## 2017-07-19 NOTE — Telephone Encounter (Signed)
I LM on [name]'s voicemail to have her call me back with more information regarding her assessment and concerns of patient's condition.

## 2017-07-19 NOTE — Telephone Encounter (Signed)
Pt is scheduled for a Medication Reconciliation with Leafy Ro today at 330.

## 2017-07-19 NOTE — ED Notes (Signed)
Patient's SpO2 decreased to 90% on RA after being assisted to restroom and back. Placed on 2L supplemental O2.

## 2017-07-19 NOTE — Telephone Encounter (Signed)
Is it okay to refill this medication?

## 2017-07-19 NOTE — Progress Notes (Signed)
Patient in today for med reconciliation.  While here patient brought in note of concern from hospice services reporting 14lb weight gain since Saturday and increased SOB and lethargy.  Assessment today reveals:  Lethargy Weakness Acutely increased SOB and hypoxia with 02 sats running 80 - 85%.    VS: 106/68, HR 100, Resp 12 Patient reports she is on 4L of oxygen while sleeping but does not wear continuous.  Abdominal and right flank pain rated at a 10 on exertion.  Burning with urination  Patient states that she feels horrible and PCP Allie Bossier, NP consulted.    Meds were reviewed and reconciled today.   Given overall presentation and symptoms patient was sent to ER.  Patient's husband present and will take her to the ER via car.   Allie Bossier, NP discussed patient's condition with her cardiology team who also agreed with plan.    At 5:05pm I spoke with nurse, Vikki Ports at [facility] ER and gave report on patient to hopefully expedite the check in process.

## 2017-07-20 ENCOUNTER — Encounter (HOSPITAL_COMMUNITY): Payer: Self-pay | Admitting: Emergency Medicine

## 2017-07-20 ENCOUNTER — Other Ambulatory Visit: Payer: Self-pay

## 2017-07-20 ENCOUNTER — Emergency Department (HOSPITAL_COMMUNITY)
Admission: EM | Admit: 2017-07-20 | Discharge: 2017-07-20 | Disposition: A | Discharge status: Home / Self Care | Payer: BLUE CROSS/BLUE SHIELD | Source: Home / Self Care | Attending: Emergency Medicine | Admitting: Emergency Medicine

## 2017-07-20 ENCOUNTER — Emergency Department (HOSPITAL_COMMUNITY)
Admission: EM | Admit: 2017-07-20 | Discharge: 2017-07-20 | Disposition: A | Discharge status: Home / Self Care | Payer: BLUE CROSS/BLUE SHIELD | Attending: Emergency Medicine | Admitting: Emergency Medicine

## 2017-07-20 ENCOUNTER — Telehealth: Payer: Self-pay

## 2017-07-20 DIAGNOSIS — I110 Hypertensive heart disease with heart failure: Secondary | ICD-10-CM | POA: Diagnosis not present

## 2017-07-20 DIAGNOSIS — I509 Heart failure, unspecified: Secondary | ICD-10-CM | POA: Diagnosis not present

## 2017-07-20 DIAGNOSIS — I130 Hypertensive heart and chronic kidney disease with heart failure and stage 1 through stage 4 chronic kidney disease, or unspecified chronic kidney disease: Secondary | ICD-10-CM | POA: Diagnosis not present

## 2017-07-20 DIAGNOSIS — R0602 Shortness of breath: Secondary | ICD-10-CM | POA: Diagnosis not present

## 2017-07-20 LAB — CBC
HCT: 37.2 % (ref 36.0–46.0)
Hemoglobin: 11.8 g/dL — ABNORMAL LOW (ref 12.0–15.0)
MCH: 28.2 pg (ref 26.0–34.0)
MCHC: 31.7 g/dL (ref 30.0–36.0)
MCV: 88.8 fL (ref 78.0–100.0)
Platelets: 238 10*3/uL (ref 150–400)
RBC: 4.19 MIL/uL (ref 3.87–5.11)
RDW: 18.2 % — ABNORMAL HIGH (ref 11.5–15.5)
WBC: 15.3 10*3/uL — ABNORMAL HIGH (ref 4.0–10.5)

## 2017-07-20 LAB — I-STAT BETA HCG BLOOD, ED (MC, WL, AP ONLY): I-stat hCG, quantitative: 6 m[IU]/mL — ABNORMAL HIGH (ref <5)

## 2017-07-20 LAB — BASIC METABOLIC PANEL
Anion gap: 13 (ref 5–15)
BUN: 27 mg/dL — ABNORMAL HIGH (ref 6–20)
CO2: 29 mmol/L (ref 22–32)
Calcium: 9.6 mg/dL (ref 8.9–10.3)
Chloride: 90 mmol/L — ABNORMAL LOW (ref 101–111)
Creatinine, Ser: 1.38 mg/dL — ABNORMAL HIGH (ref 0.44–1.00)
GFR calc Af Amer: 51 mL/min — ABNORMAL LOW (ref >60)
GFR calc non Af Amer: 44 mL/min — ABNORMAL LOW (ref >60)
Glucose, Bld: 397 mg/dL — ABNORMAL HIGH (ref 65–99)
Potassium: 3.9 mmol/L (ref 3.5–5.1)
Sodium: 132 mmol/L — ABNORMAL LOW (ref 135–145)

## 2017-07-20 LAB — BRAIN NATRIURETIC PEPTIDE: B Natriuretic Peptide: 29.6 pg/mL (ref 0.0–100.0)

## 2017-07-20 LAB — I-STAT TROPONIN, ED: Troponin i, poc: 0 ng/mL (ref 0.00–0.08)

## 2017-07-20 MED ORDER — IBUPROFEN 800 MG PO TABS
800.0000 mg | ORAL_TABLET | Freq: Once | ORAL | Status: AC
  Administered 2017-07-20: 800 mg via ORAL
  Filled 2017-07-20: qty 1

## 2017-07-20 NOTE — ED Triage Notes (Signed)
Pt returns to ED from home. Checked in yesterday but left due to wait times. Pt to ED from home with worsening SOB - pt has CHF and COPD, on 4L O2 [place] when sleeping, not on oxygen otherwise. Patient's palliative care nurse stated that patient may need to be on oxygen all of the time. 14lb. Weight gain since Sunday. Pt was at PCP office and RA sat in the 80s. Patient SpO2 98% on RA in triage. Patient also endorses new R sided abdominal pain x 2 days that is worse with movement, denies N/V/D, denies cough, no fevers or chills. Resp e/u in triage, crackles heard bilaterally. Skin warm/dry. Sats 87% on RA.

## 2017-07-20 NOTE — Telephone Encounter (Signed)
Met with patient and reviewed all medications.  She currently is in need of the following refills:  1.  Lyrica 363m which she takes 1 po bid - set up for printing for patient to pick up 2.  Ibuprofen 8040m1 pill up to 3 times daily with food prn pain.  *[facility] for physician approval as patient is currently being evaluated in the ER and unsure what changes may occur.

## 2017-07-20 NOTE — ED Notes (Signed)
Pt did not answer for room. 

## 2017-07-20 NOTE — ED Notes (Signed)
Unable to locate pt x1

## 2017-07-20 NOTE — Telephone Encounter (Signed)
Noted, patient currently at [facility] and is being triaged.

## 2017-07-20 NOTE — ED Notes (Signed)
Patient given discharge instructions and verbalized understanding.  Patient stable to discharge at this time.  Patient is alert and oriented to baseline.  No distressed noted at this time.  All belongings taken with the patient at discharge.   

## 2017-07-20 NOTE — Telephone Encounter (Signed)
Arnaldo Natal RN with [facility], home based palliative care agency left v/m; pt did go to ED as instructed and had some baseline testing that showed increased WBC but pt was told would be 9 hrs before saw physician and pt left ED without being seen. Pricilla Holm has spoken with pt this morning and still having tremendous rt side pain. Pt was instructed to go back to ED for eval. Pricilla Holm called to update Gentry Fitz NP. I called and spoke with pt and she is getting dressed now to go to ED. FYI to Gentry Fitz NP and [employer].

## 2017-07-20 NOTE — Discharge Instructions (Signed)
Take 2.5mg  Metolazone EVERY DAY for the next 10 days. Continue all of your other medications as prescribed. Use your oxygen when sleeping as well as when sedentary or resting. Try to discontinue smoking as this is not good for your lung health. We also advise Tylenol or ibuprofen for your abdominal pain which we believe is due to a muscle strain. Follow up with your primary care doctor as needed for a recheck of your symptoms. You may return to the emergency department, as needed, for new or concerning symptoms.

## 2017-07-20 NOTE — ED Notes (Signed)
Pt did  Not answer for room multiple times

## 2017-07-20 NOTE — ED Notes (Signed)
Pt ambulated in hallway on pulse ox. Her O2 saturation started at 95% and dropped to 88% while ambulating. When she sat down back in her room it was at 92%. Gait steady but slow.

## 2017-07-20 NOTE — Telephone Encounter (Signed)
These notify patient that I think she is taking too much Lyrica which can cause drowsiness. She willing to reduce her dose to 150 mg twice daily?  I recommend it. Also, is she having to take the ibuprofen everyday or just as needed?

## 2017-07-20 NOTE — ED Provider Notes (Signed)
[facility] EMERGENCY DEPARTMENT Provider Note   CSN: [id number] Arrival date & time: 07/20/17  1151     History   Chief Complaint Chief Complaint  Patient presents with  . Shortness of Breath    HPI [name] is a 47 y.o. female.  47 year old female with a history of COPD (on chronic 4 L oxygen via nasal cannula when sleeping or resting), esophageal reflux, diabetes, CAD, chronic diastolic CHF presents to the emergency department for shortness of breath.  Patient reports a 14 pound weight gain over the past 4 days.  She came at the advice of her palliative care nurse who was concerned that she may require oxygen at all times rather than just nightly.  She has had progressive leg swelling which is bilateral.  No fevers, increased cough.  The patient is a chronic smoker, smoking approximately one half a pack per day.  She also reports a pain in her right upper abdomen.  This is worse with certain movements.  It is not aggravated with eating.  No associated vomiting or diarrhea.  Abdominal surgical history significant for hernia repair.   The history is provided by the patient. No language interpreter was used.  Shortness of Breath     Past Medical History:  Diagnosis Date  . Anxiety    takes Prozac daily  . Anxiety   . Aortic valve calcification   . Asthma    Advair and Spirva daily  . Asthma   . Bipolar disorder ([place])   . CAD (coronary artery disease)    a. LHC 11/2013 done for CP/fluid retention: mild disease in prox LAD, mild-mod disease in mRCA, EF 60% with normal LVEDP. b. Normal nuc 03/2016.  Marland Kitchen CHF (congestive heart failure) ([place])   . Chronic diastolic CHF (congestive heart failure) ([place])   . CKD (chronic kidney disease), stage II   . COPD (chronic obstructive pulmonary disease) (HCC)    a. nocturnal O2.  Marland Kitchen COPD (chronic obstructive pulmonary disease) ([place])   . Coronary artery disease   . Diabetes mellitus   . Diabetes mellitus without  complication ([place])   . Dyspnea   . Family history of adverse reaction to anesthesia    mom gets nauseated  . GERD (gastroesophageal reflux disease)    takes Pepcid daily  . History of blood clots    left leg 3-61yrs ago  . Hyperlipidemia   . Hypertension   . Hypertriglyceridemia   . Inguinal hernia, left 01/2015  . Muscle spasm   . Peripheral neuropathy   . RBBB   . Seizures ([place])   . Sinus tachycardia    a. persistent since 2009.  Marland Kitchen Smokers' cough ([place])   . Stroke [facility] 1989   left sided weakness  . TIA (transient ischemic attack)   . Tobacco abuse     Patient Active Problem List   Diagnosis Date Noted  . Nocturnal hypoxemia 03/29/2017  . Vision disturbance 02/28/2017  . CKD (chronic kidney disease), stage II 02/28/2017  . Visual loss, bilateral   . Chronic respiratory failure with hypoxia (HCC)/ nocturnal 02 dep  10/28/2016  . Upper airway cough syndrome 08/22/2016  . Cigarette smoker 06/15/2016  . Simple chronic bronchitis ([place])   . Coronary artery disease involving native heart without angina pectoris 05/16/2016  . Acute on chronic diastolic (congestive) heart failure ([place] [facility]) 11/04/2015  . Orthopnea 11/03/2015  . Dyspnea   . Bilateral leg edema   . Decreased urine stream   . COPD  exacerbation ([place]) 10/15/2015  . Fracture of rib, closed 10/15/2015  . Venous stasis of both lower extremities 03/29/2015  . Preventative health care 02/04/2015  . Essential hypertension 01/02/2015  . Inguinal hernia 11/27/2014  . Allergic rhinitis with postnasal drip 01/21/2014  . Aortic valve disorders 04/18/2013  . Numbness and tingling 04/01/2013  . Sleep apnea 05/22/2012  . Sinus tachycardia 10/09/2011  . Seizure disorder ([place]) 10/09/2011  . Bipolar disorder ([place]) 10/09/2011  . TIA (transient ischemic attack) 06/22/2011  . Constipation 06/15/2011  . Diabetic polyneuropathy ([place]) 06/08/2010  . HYPERTRIGLYCERIDEMIA 07/17/2007  . COPD GOLD 0  05/30/2007  . Morbid (severe) obesity  due to excess calories ([place]) 04/23/2007  . DM (diabetes mellitus), type 2, uncontrolled ([place]) 03/22/2007  . Tobacco abuse 09/07/2006  . Asthma, persistent 09/07/2006    Past Surgical History:  Procedure Laterality Date  . HERNIA REPAIR    . INGUINAL HERNIA REPAIR Left 04/08/2015   Procedure: OPEN LEFT INGUINAL HERNIA REPAIR WITH MESH;  Surgeon: Ralene Ok, MD;  Location: [facility];  Service: General;  Laterality: Left;  . INSERTION OF MESH Left 04/08/2015   Procedure: INSERTION OF MESH;  Surgeon: Ralene Ok, MD;  Location: [facility];  Service: General;  Laterality: Left;  . LAPAROSCOPY     Endometriosis  . LEFT HEART CATHETERIZATION WITH CORONARY ANGIOGRAM N/A 12/05/2013   Procedure: LEFT HEART CATHETERIZATION WITH CORONARY ANGIOGRAM;  Surgeon: Jettie Booze, MD;  Location: [facility] CATH LAB;  Service: Cardiovascular;  Laterality: N/A;  . right kidney drained    . TEE WITHOUT CARDIOVERSION N/A 11/28/2013   Procedure: TRANSESOPHAGEAL ECHOCARDIOGRAM (TEE);  Surgeon: Thayer Headings, MD;  Location: [facility];  Service: Cardiovascular;  Laterality: N/A;    OB History    No data available       Home Medications    Prior to Admission medications   Medication Sig Start Date End Date Taking? Authorizing Provider  acetaminophen (TYLENOL) 325 MG tablet Take 2 tablets (650 mg total) by mouth every 4 (four) hours as needed for headache or mild pain. 03/19/17   Patwardhan, Reynold Bowen, MD  ALPRAZolam Duanne Moron) 1 MG tablet Take 1 mg by mouth 2 (two) times daily.     [provider]  aspirin EC 81 MG tablet Take 81 mg by mouth every morning.    [provider]  atorvastatin (LIPITOR) 40 MG tablet TAKE 1 TABLET BY MOUTH DAILY 07/19/17   Pleas Koch, NP  budesonide-formoterol (SYMBICORT) 80-4.5 MCG/ACT inhaler INHALE 2 PUFFS EVERY 12 HOURS TO [place] OR WHEEZING--RINSE, GARGLE, & SPITAFTER EACH USE. 06/15/17   Tanda Rockers, MD  busPIRone (BUSPAR) 30 MG tablet Take 1 tablet  (30 mg total) by mouth 2 (two) times daily. 05/05/17   Pleas Koch, NP  FLUoxetine (PROZAC) 40 MG capsule Take 40 mg by mouth daily.    [provider]  ibuprofen (ADVIL,MOTRIN) 800 MG tablet TAKE 1 TABLET BY MOUTH 3 TIMES A DAY WITH MEALS 04/14/17   [provider]  insulin regular human CONCENTRATED (HUMULIN R U-500 KWIKPEN) 500 UNIT/ML kwikpen 250 units with breakfast, 60 units with lunch, and 220 units with supper, and pen needles 3/day Patient taking differently: 250 units with breakfast, and 240 units with supper, and pen needles 3/day 06/28/17   Renato Shin, MD  linaclotide [facility]) 290 MCG CAPS capsule Take 1 capsule (290 mcg total) by mouth daily before breakfast. 06/06/17 09/04/17  Jonathon Bellows, MD  liraglutide (VICTOZA) 18 MG/3ML SOPN Inject 1.8  mg into the skin every morning.    [provider]  lubiprostone (AMITIZA) 24 MCG capsule Take 1 capsule (24 mcg total) by mouth 2 (two) times daily with a meal. 05/31/17   Jonathon Bellows, MD  metFORMIN (GLUCOPHAGE) 1000 MG tablet TAKE 1 TABLET BY MOUTH TWICE A DAY 06/12/17   Pleas Koch, NP  metolazone (ZAROXOLYN) 2.5 MG tablet Take 2.5 mg by mouth every other day. Take 2.5 mg by mouth every 48 hours on Tuesday, Thursday and Saturday    [provider]  nicotine (NICODERM CQ - DOSED IN MG/24 HOURS) 21 mg/24hr patch Place 1 patch (21 mg total) at bedtime onto the skin. 05/23/17   Pleas Koch, NP  ondansetron (ZOFRAN) 4 MG tablet Take 4 mg by mouth every 6 (six) hours as needed for nausea or vomiting.    [provider]  OXYGEN Inhale 4 L into the lungs at bedtime.     [provider]  POTASSIUM CHLORIDE ER PO Take 20 mEq by mouth daily.    [provider]  pregabalin (LYRICA) 300 MG capsule Take 1 capsule (300 mg total) by mouth 2 (two) times daily. 04/14/17   Pleas Koch, NP  PROVENTIL HFA 108 [phone] [place]) MCG/ACT inhaler INHALE TWO (2) PUFFS EVERY FOUR HOURS  ASNEEDED FOR SHORTNESS OF BREATH 07/30/15   Ronnie Doss M, DO  QUEtiapine (SEROQUEL XR) 400 MG 24 hr tablet Take 400 mg by mouth at bedtime. Patient is weaning off of medication per psychiatrist.  She is only taking 1/2 pill at bedtime.    [provider]  ranitidine (ZANTAC) 150 MG tablet Take 150 mg by mouth 2 (two) times daily.     [provider]  rOPINIRole (REQUIP) 0.5 MG tablet TAKE ONE TABLET BY MOUTH EVERY NIGHT AT BEDTIME 04/30/17   Pleas Koch, NP  spironolactone (ALDACTONE) 50 MG tablet Take 50 mg by mouth daily.    [provider]  torsemide (DEMADEX) 10 MG tablet Take 3 tablets by mouth once daily for swelling and fluid retention. Patient taking differently: Take 20 mg by mouth 2 (two) times daily. Take 1 tablet twice a day as needed for leg edema 04/04/17   Pleas Koch, NP  traZODone (DESYREL) 50 MG tablet Take 50 mg by mouth at bedtime. Take 1 - 3 tablets daily by mouth at bedtime    [provider]  Vitamin D, Ergocalciferol, (DRISDOL) 50000 units CAPS capsule Take 50,000 Units by mouth every 7 (seven) days. Takes each Wednesday before breakfast weekly.     [provider]    Family History Family History  Problem Relation Age of Onset  . Venous thrombosis Brother   . Other Brother        BRAIN TUMOR  . Asthma Father   . Diabetes Father   . Coronary artery disease Mother   . Hypertension Mother   . Diabetes Mother   . Asthma Sister   . Diabetes type II Brother     Social History Social History   Tobacco Use  . Smoking status: Current Some Day Smoker    Packs/day: 0.50    Years: 30.00    Pack years: 15.00    Types: Cigarettes    Last attempt to quit: 01/08/2017    Years since quitting: 0.5  . Smokeless tobacco: Never Used  Substance Use Topics  . Alcohol use: Yes  . Drug use: No     Allergies   Metoprolol;  Montelukast; Morphine sulfate; Penicillins; Prednisone; Diltiazem; and  Gabapentin   Review of Systems Review of Systems  Respiratory: Positive for shortness of breath.   Ten systems reviewed and are negative for acute change, except as noted in the HPI.    Physical Exam Updated Vital Signs BP 104/82   Pulse (!) 103   Temp 98.2 F (36.8 C) (Oral)   Resp 14   LMP 11/11/2011   SpO2 (!) 89%   Physical Exam  Constitutional: She is oriented to person, place, and time. She appears well-developed and well-nourished. No distress.  Nontoxic appearing and in NAD. Obese.  HENT:  Head: Normocephalic and atraumatic.  Eyes: Conjunctivae and EOM are normal. No scleral icterus.  Neck: Normal range of motion.  Cardiovascular: Normal rate, regular rhythm and intact distal pulses.  Pulmonary/Chest: Effort normal. No stridor. No respiratory distress. She has no rales.  Respirations even and unlabored. Grossly clear lungs. Prolonged expiratory phase.  Musculoskeletal: Normal range of motion.  No significant BLE pitting edema.  Neurological: She is alert and oriented to person, place, and time. She exhibits normal muscle tone. Coordination normal.  Skin: Skin is warm and dry. No rash noted. She is not diaphoretic. No erythema. No pallor.  Psychiatric: She has a normal mood and affect. Her behavior is normal.  Nursing note and vitals reviewed.    ED Treatments / Results  Labs (all labs ordered are listed, but only abnormal results are displayed) Labs Reviewed  BASIC METABOLIC PANEL - Abnormal; Notable for the following components:      Result Value   Sodium 132 (*)    Chloride 90 (*)    Glucose, Bld 397 (*)    BUN 27 (*)    Creatinine, Ser 1.38 (*)    GFR calc non Af Amer 44 (*)    GFR calc Af Amer 51 (*)    All other components within normal limits  CBC - Abnormal; Notable for the following components:   WBC 15.3 (*)    Hemoglobin 11.8 (*)    RDW 18.2 (*)    All other components within normal limits  I-STAT BETA HCG BLOOD, ED ([facility], [facility], [facility] ONLY) -  Abnormal; Notable for the following components:   I-stat hCG, quantitative 6.0 (*)    All other components within normal limits  BRAIN NATRIURETIC PEPTIDE  I-STAT TROPONIN, ED    EKG  EKG Interpretation  Date/Time:  Thursday July 20 2017 12:31:56 EST Ventricular Rate:  96 PR Interval:  140 QRS Duration: 118 QT Interval:  386 QTC Calculation: 487 R Axis:   -19 Text Interpretation:  Sinus rhythm with Premature ventricular complexes or Fusion complexes Incomplete right bundle branch block Nonspecific T wave abnormality Prolonged QT Abnormal ECG No significant change since last tracing Confirmed by Dorie Rank [phone]) on 07/20/2017 12:43:31 PM       Radiology Dg Chest 2 View  Result Date: 07/19/2017 CLINICAL DATA:  Shortness of breath and dizziness. EXAM: CHEST  2 VIEW COMPARISON:  03/29/2017 FINDINGS: Mildly enlarged cardiac silhouette. Mediastinal contours appear intact. There is no evidence of focal airspace consolidation, pleural effusion or pneumothorax. Chronic elevation of the right hemidiaphragm. Mild interstitial prominence. Osseous structures are without acute abnormality. Soft tissues are grossly normal. IMPRESSION: Mild interstitial prominence which may represent pulmonary vascular congestion. Mildly enlarged cardiac silhouette. Electronically Signed   By: Fidela Salisbury M.D.   On: 07/19/2017 20:17    Procedures Procedures (including critical care time)   ECHO - 03/2017 LV  EF: 65% -   70%  ------------------------------------------------------------------- Indications:      CHF - 428.0.  ------------------------------------------------------------------- History:   PMH:   Congestive heart failure.  Stroke.  Chronic obstructive pulmonary disease.  Risk factors:  Current tobacco use.   ------------------------------------------------------------------- Study Conclusions  - Left ventricle: The cavity size was normal. Wall thickness was   increased in a  pattern of mild LVH. There was mild concentric   hypertrophy. Systolic function was vigorous. The estimated   ejection fraction was in the range of 65% to 70%. Left   ventricular diastolic function parameters were normal. - Left atrium: The atrium was mildly dilated.  Impressions:  - Normal pulmonary artery pressure.   Medications Ordered in ED Medications  ibuprofen (ADVIL,MOTRIN) tablet 800 mg (not administered)     Initial Impression / Assessment and Plan / ED Course  I have reviewed the triage vital signs and the nursing notes.  Pertinent labs & imaging results that were available during my care of the patient were reviewed by me and considered in my medical decision making (see chart for details).     47 year old female presents to the emergency department for evaluation of shortness of breath.  She is a chronic smoker on nightly oxygen for management of COPD.  She endorses a 14 pound weight gain with increasing leg swelling over the past few days.  BNP is reassuring; however, symptoms concerning for mild worsening of chronic congestive heart failure.  Imaging was completed in the emergency department yesterday.  This showed mild interstitial prominence favored to represent pulmonary vascular congestion.  Patient also noting abdominal pain.  This is worse with certain movements and not aggravated with eating.  She has a negative Murphy sign and does not report any vomiting or diarrhea.  I suspect this pain to be muscular in etiology.  She has had improvement previously with Tylenol or Motrin.  Plan to increase the patient's diuretic from QOD to QD and have her follow-up with her primary care doctor and cardiologist regarding her visit today.  I do not believe the patient requires increase in her current oxygen use.  This may be followed by pulmonology as needed.  Further advised dietary restrictions including decrease in sodium intake.  Return precautions discussed and provided.  Patient discharged in stable condition with no unaddressed concerns.   Final Clinical Impressions(s) / ED Diagnoses   Final diagnoses:  Acute on chronic congestive heart failure, unspecified heart failure type [facility])    ED Discharge Orders    None       Antonietta Breach, PA-C 07/28/17 9030    Jola Schmidt, MD 07/28/17 [phone]

## 2017-07-21 ENCOUNTER — Telehealth: Payer: Self-pay

## 2017-07-21 ENCOUNTER — Other Ambulatory Visit: Payer: Self-pay | Admitting: Primary Care

## 2017-07-21 DIAGNOSIS — I5030 Unspecified diastolic (congestive) heart failure: Secondary | ICD-10-CM | POA: Diagnosis not present

## 2017-07-21 DIAGNOSIS — G629 Polyneuropathy, unspecified: Secondary | ICD-10-CM

## 2017-07-21 DIAGNOSIS — I10 Essential (primary) hypertension: Secondary | ICD-10-CM | POA: Diagnosis not present

## 2017-07-21 DIAGNOSIS — F17200 Nicotine dependence, unspecified, uncomplicated: Secondary | ICD-10-CM | POA: Diagnosis not present

## 2017-07-21 MED ORDER — PREGABALIN 300 MG PO CAPS: 300.0000 mg | ORAL_CAPSULE | Freq: Two times a day (BID) | ORAL | 0 refills | Status: DC

## 2017-07-21 MED ORDER — IBUPROFEN 800 MG PO TABS: ORAL_TABLET | ORAL | 0 refills | Status: DC

## 2017-07-21 NOTE — Telephone Encounter (Signed)
Pt called to discuss lyrica; Leafy Ro out of office. Vallarie Mare CMA will speak with pt.

## 2017-07-21 NOTE — Telephone Encounter (Signed)
Left message on patient's home number to please call back and to discuss these medications as soon possible.

## 2017-07-21 NOTE — Telephone Encounter (Signed)
This has been addressed in another encounter.

## 2017-07-21 NOTE — Telephone Encounter (Signed)
Spoken to patient. She stated that she did try 150 mg at first  And it did not help so she was put on 300 mg.  Also patient take the ibuprofen as needed.

## 2017-07-21 NOTE — Telephone Encounter (Signed)
Please refer to other phone encounter regarding refills on the Lyrica.  We are trying to reach patient to discuss decreasing dosage.

## 2017-07-21 NOTE — Telephone Encounter (Signed)
Addressed on another encounter.

## 2017-07-21 NOTE — Telephone Encounter (Signed)
Okay to phone in Lyrica 300 mg capsules, will discuss reducing dose at next office visit.  Take 1 capsule by mouth twice daily for diabetic neuropathy.  #180, no refills.

## 2017-07-24 NOTE — Telephone Encounter (Signed)
[place] from [facility] left v/m that [name] is not going to be able to fill lyrica. [facility] said pt request lyrica sent to [facility].Please advise.

## 2017-07-24 NOTE — Telephone Encounter (Signed)
Spoke to [place] at [facility] and was advised that they are not able to get this medication from their supplier at this time. [facility] per [facility]any's request because they can not transfer this medication.

## 2017-07-24 NOTE — Telephone Encounter (Signed)
Rx called to pharmacy as instructed. 

## 2017-07-25 ENCOUNTER — Telehealth: Payer: Self-pay | Admitting: Primary Care

## 2017-07-25 NOTE — Telephone Encounter (Signed)
I left a message on patient's voice mail to call back and schedule a 30 minute 1 week follow up with Anda Kraft. CRM was done for patient's return call.

## 2017-07-25 NOTE — Telephone Encounter (Signed)
Will you please schedule patient for an appointment in 1 week. We need to repeat some lab work and re-evaluate her weight and swelling. 30 minute visit please.

## 2017-07-28 ENCOUNTER — Ambulatory Visit (INDEPENDENT_AMBULATORY_CARE_PROVIDER_SITE_OTHER): Payer: BLUE CROSS/BLUE SHIELD | Admitting: Endocrinology

## 2017-07-28 ENCOUNTER — Encounter: Payer: Self-pay | Admitting: Endocrinology

## 2017-07-28 ENCOUNTER — Ambulatory Visit (INDEPENDENT_AMBULATORY_CARE_PROVIDER_SITE_OTHER): Payer: BLUE CROSS/BLUE SHIELD | Admitting: Family Medicine

## 2017-07-28 ENCOUNTER — Encounter: Payer: Self-pay | Admitting: Family Medicine

## 2017-07-28 ENCOUNTER — Ambulatory Visit: Payer: BLUE CROSS/BLUE SHIELD | Admitting: Family Medicine

## 2017-07-28 VITALS — BP 104/62 | HR 111 | Wt 275.2 lb

## 2017-07-28 VITALS — BP 132/70 | HR 108 | Temp 98.0°F | Wt 277.2 lb

## 2017-07-28 DIAGNOSIS — E1165 Type 2 diabetes mellitus with hyperglycemia: Secondary | ICD-10-CM

## 2017-07-28 DIAGNOSIS — J069 Acute upper respiratory infection, unspecified: Secondary | ICD-10-CM

## 2017-07-28 LAB — POCT GLYCOSYLATED HEMOGLOBIN (HGB A1C): Hemoglobin A1C: 11.4

## 2017-07-28 MED ORDER — INSULIN REGULAR HUMAN (CONC) 500 UNIT/ML ~~LOC~~ SOPN: PEN_INJECTOR | SUBCUTANEOUS | 11 refills | Status: DC

## 2017-07-28 MED ORDER — BENZONATATE 100 MG PO CAPS: 100.0000 mg | ORAL_CAPSULE | Freq: Three times a day (TID) | ORAL | 0 refills | Status: DC | PRN

## 2017-07-28 MED ORDER — DOXYCYCLINE HYCLATE 100 MG PO CAPS: 100.0000 mg | ORAL_CAPSULE | Freq: Two times a day (BID) | ORAL | 0 refills | Status: DC

## 2017-07-28 NOTE — Progress Notes (Signed)
Subjective:    Patient ID: [name], female    DOB: 1970/12/01, 47 y.o.   MRN: [id number]  HPI Pt returns for f/u of diabetes mellitus: DM type: Insulin-requiring type 2 Dx'ed: 1988.  Complications: polyneuropathy, TIA, renal insuff, and DR Therapy: insulin since soon after dx (also takes victoza, metformin, and farxiga) GDM: never DKA: never Severe hypoglycemia: last episode was mid-2018 Pancreatitis: never Pancreatic imaging: normal on 2011 CT Other: she takes multiple daily injections; she takes U-500, due to severe insulin resistance Interval history:  she brings a record of her cbg's which I have reviewed today.  It varies from 286-400's.  There is no trend throughout the day.  She does not eat lunch, so she does not take insulin then.  pt states she feels well in general. Past Medical History:  Diagnosis Date  . Anxiety    takes Prozac daily  . Anxiety   . Aortic valve calcification   . Asthma    Advair and Spirva daily  . Asthma   . Bipolar disorder ([place])   . CAD (coronary artery disease)    a. LHC 11/2013 done for CP/fluid retention: mild disease in prox LAD, mild-mod disease in mRCA, EF 60% with normal LVEDP. b. Normal nuc 03/2016.  Marland Kitchen CHF (congestive heart failure) ([place])   . Chronic diastolic CHF (congestive heart failure) ([place])   . CKD (chronic kidney disease), stage II   . COPD (chronic obstructive pulmonary disease) (H[facility])    a. nocturnal O2.  Marland Kitchen COPD (chronic obstructive pulmonary disease) ([place])   . Coronary artery disease   . Diabetes mellitus   . Diabetes mellitus without complication ([place])   . Dyspnea   . Family history of adverse reaction to anesthesia    mom gets nauseated  . GERD (gastroesophageal reflux disease)    takes Pepcid daily  . History of blood clots    left leg 3-75yrs ago  . Hyperlipidemia   . Hypertension   . Hypertriglyceridemia   . Inguinal hernia, left 01/2015  . Muscle spasm   . Peripheral neuropathy   . RBBB   . Seizures ([place])    . Sinus tachycardia    a. persistent since 2009.  Marland Kitchen Smokers' cough ([place])   . Stroke [facility]) 1989   left sided weakness  . TIA (transient ischemic attack)   . Tobacco abuse     Past Surgical History:  Procedure Laterality Date  . HERNIA REPAIR    . INGUINAL HERNIA REPAIR Left 04/08/2015   Procedure: OPEN LEFT INGUINAL HERNIA REPAIR WITH MESH;  Surgeon: Ralene Ok, MD;  Location: [place];  Service: General;  Laterality: Left;  . INSERTION OF MESH Left 04/08/2015   Procedure: INSERTION OF MESH;  Surgeon: Ralene Ok, MD;  Location: [place];  Service: General;  Laterality: Left;  . LAPAROSCOPY     Endometriosis  . LEFT HEART CATHETERIZATION WITH CORONARY ANGIOGRAM N/A 12/05/2013   Procedure: LEFT HEART CATHETERIZATION WITH CORONARY ANGIOGRAM;  Surgeon: Jettie Booze, MD;  Location: [facility] CATH LAB;  Service: Cardiovascular;  Laterality: N/A;  . right kidney drained    . TEE WITHOUT CARDIOVERSION N/A 11/28/2013   Procedure: TRANSESOPHAGEAL ECHOCARDIOGRAM (TEE);  Surgeon: Thayer Headings, MD;  Location: [facility] ENDOSCOPY;  Service: Cardiovascular;  Laterality: N/A;    Social History   Socioeconomic History  . Marital status: Married    Spouse name: Not on file  . Number of children: Not on file  . Years of education: Not on  file  . Highest education level: Not on file  Social Needs  . Financial resource strain: Not on file  . Food insecurity - worry: Not on file  . Food insecurity - inability: Not on file  . Transportation needs - medical: Not on file  . Transportation needs - non-medical: Not on file  Occupational History  . Not on file  Tobacco Use  . Smoking status: Current Some Day Smoker    Packs/day: 0.50    Years: 30.00    Pack years: 15.00    Types: Cigarettes    Last attempt to quit: 01/08/2017    Years since quitting: 0.5  . Smokeless tobacco: Never Used  Substance and Sexual Activity  . Alcohol use: Yes  . Drug use: No  . Sexual activity: No  Other Topics  Concern  . Not on file  Social History Narrative   ** Merged History Encounter **       Lives in [place]   Married but lives with Boyfriend - not legally separated   Disabled - for BiPolar, Seizure disorder, diabetes   Formerly worked at [employer]          Current Outpatient Medications on File Prior to Visit  Medication Sig Dispense Refill  . acetaminophen (TYLENOL) 325 MG tablet Take 2 tablets (650 mg total) by mouth every 4 (four) hours as needed for headache or mild pain. 30 tablet 2  . ALPRAZolam (XANAX) 1 MG tablet Take 1 mg by mouth 2 (two) times daily.     Marland Kitchen aspirin EC 81 MG tablet Take 81 mg by mouth every morning.    Marland Kitchen atorvastatin (LIPITOR) 40 MG tablet TAKE 1 TABLET BY MOUTH DAILY 90 tablet 2  . budesonide-formoterol (SYMBICORT) 80-4.5 MCG/ACT inhaler INHALE 2 PUFFS EVERY 12 HOURS TO PREVENTCOUGH OR WHEEZING--RINSE, GARGLE, & SPITAFTER EACH USE. 10.2 g 4  . busPIRone (BUSPAR) 30 MG tablet Take 1 tablet (30 mg total) by mouth 2 (two) times daily. 60 tablet 0  . FLUoxetine (PROZAC) 40 MG capsule Take 40 mg by mouth daily.    Marland Kitchen ibuprofen (ADVIL,MOTRIN) 800 MG tablet Take 1 tablet by mouth every 8 hours as needed for moderate pain. 30 tablet 0  . linaclotide (LINZESS) 290 MCG CAPS capsule Take 1 capsule (290 mcg total) by mouth daily before breakfast. 90 capsule 0  . liraglutide (VICTOZA) 18 MG/3ML SOPN Inject 1.8 mg into the skin every morning.    . lubiprostone (AMITIZA) 24 MCG capsule Take 1 capsule (24 mcg total) by mouth 2 (two) times daily with a meal. 60 capsule 3  . metolazone (ZAROXOLYN) 2.5 MG tablet Take 2.5 mg by mouth every other day. Take 2.5 mg by mouth every 48 hours on Tuesday, Thursday and Saturday    . nicotine (NICODERM CQ - DOSED IN MG/24 HOURS) 21 mg/24hr patch Place 1 patch (21 mg total) at bedtime onto the skin. 28 patch 1  . ondansetron (ZOFRAN) 4 MG tablet Take 4 mg by mouth every 6 (six) hours as needed for nausea or vomiting.    . OXYGEN Inhale 4 L into  the lungs at bedtime.     Marland Kitchen POTASSIUM CHLORIDE ER PO Take 20 mEq by mouth daily.    . pregabalin (LYRICA) 300 MG capsule Take 1 capsule (300 mg total) by mouth 2 (two) times daily. 180 capsule 0  . PROVENTIL HFA 108 (90 Base) MCG/ACT inhaler INHALE TWO (2) PUFFS EVERY FOUR HOURS ASNEEDED FOR SHORTNESS OF BREATH 6.7 g 2  .  QUEtiapine (SEROQUEL XR) 400 MG 24 hr tablet Take 400 mg by mouth at bedtime. Patient is weaning off of medication per psychiatrist.  She is only taking 1/2 pill at bedtime.    . ranitidine (ZANTAC) 150 MG tablet Take 150 mg by mouth 2 (two) times daily.     Marland Kitchen rOPINIRole (REQUIP) 0.5 MG tablet TAKE ONE TABLET BY MOUTH EVERY NIGHT AT BEDTIME 90 tablet 0  . spironolactone (ALDACTONE) 50 MG tablet Take 50 mg by mouth daily.    Marland Kitchen torsemide (DEMADEX) 10 MG tablet Take 3 tablets by mouth once daily for swelling and fluid retention. (Patient taking differently: Take 20 mg by mouth 2 (two) times daily. Take 1 tablet twice a day as needed for leg edema) 90 tablet 0  . traZODone (DESYREL) 50 MG tablet Take 50 mg by mouth at bedtime. Take 1 - 3 tablets daily by mouth at bedtime    . Vitamin D, Ergocalciferol, (DRISDOL) 50000 units CAPS capsule Take 50,000 Units by mouth every 7 (seven) days. Takes each Wednesday before breakfast weekly.      No current facility-administered medications on file prior to visit.     Allergies  Allergen Reactions  . Metoprolol Shortness Of Breath    Occurrence of shortness of breath after 3 days  . Montelukast Shortness Of Breath  . Morphine Sulfate Shortness Of Breath and Nausea And Vomiting    Swollen Throat - Able to tolerate dilaudid  . Penicillins Hives and Shortness Of Breath    Swelling on throat Has patient had a PCN reaction causing immediate rash, facial/tongue/throat swelling, SOB or lightheadedness with hypotension: Yes Has patient had a PCN reaction causing severe rash involving mucus membranes or skin necrosis: No Has patient had a PCN  reaction that required hospitalization Yes Has patient had a PCN reaction occurring within the last 10 years: No If all of the above answers are "NO", then may proceed with Cephalosporin use.   . Prednisone Anaphylaxis  . Diltiazem Swelling  . Gabapentin Swelling    Family History  Problem Relation Age of Onset  . Venous thrombosis Brother   . Other Brother        BRAIN TUMOR  . Asthma Father   . Diabetes Father   . Coronary artery disease Mother   . Hypertension Mother   . Diabetes Mother   . Asthma Sister   . Diabetes type II Brother     BP 104/62 (BP Location: Left Arm, Patient Position: Sitting, Cuff Size: Normal)   Pulse (!) 111   Wt 275 lb 3.2 oz (124.8 kg)   LMP 11/11/2011   SpO2 97%   BMI 44.42 kg/m    Review of Systems She denies hypoglycemia    Objective:   Physical Exam VITAL SIGNS:  See vs page GENERAL: no distress Pulses: dorsalis pedis intact bilat.   MSK: no deformity of the feet CV: 1+ bilat leg edema Skin:  no ulcer on the feet, but the skin is dry.  normal color and temp on the feet. Neuro: sensation is intact to touch on the feet, but decreased from normal Ext: There is bilateral onychomycosis of the toenails.    Lab Results  Component Value Date   HGBA1C 11.4 07/28/2017   Lab Results  Component Value Date   CREATININE 1.38 (H) 07/20/2017   BUN 27 (H) 07/20/2017   NA 132 (L) 07/20/2017   K 3.9 07/20/2017   CL 90 (L) 07/20/2017   CO2 29 07/20/2017  Assessment & Plan:  Insulin-requiring type 2 DM, with DR: she needs increased rx.  Renal insuff: in this setting, she does not seem to need basal insulin.   Patient Instructions  check your blood sugar twice a day.  vary the time of day when you check, between before the 3 meals, and at bedtime.  also check if you have symptoms of your blood sugar being too high or too low.  please keep a record of the readings and bring it to your next appointment here (or you can bring the meter  itself).  You can write it on any piece of paper.  please call us sooner if your blood sugar goes below 70, or if you have a lot of readings over 200. For now, please:  Increase the U-500 to 300 units with breakfast and 300 units with supper, and:  Please continue the same other diabetes medications Please call or message [place] next week, to tell us how the blood sugar is doing.   Please come back for a follow-up appointment in 1 month.

## 2017-07-28 NOTE — Progress Notes (Signed)
Subjective:    Patient ID: [name], female    DOB: 1970-07-23, 47 y.o.   MRN: [id number]  HPI This is a 47 yo female, accompanied by her son, who presents today with chest and head congestion x 5 days. Runny nose, sore throat, cough. Yellow phlegm. No subjective fever. Some SOB, some wheeze. No difficulty sleeping. Has been taking Theraflu. Took some Mucinex Fast Max this morning with some relief. Worst symptoms is nose blowing and coughing. Husband has been sick with similar symptoms. History of asthma, taking spiriva daily, albuterol x 3 this week. Has been cutting back on smoking , using nicotine patch.   Past Medical History:  Diagnosis Date  . Anxiety    takes Prozac daily  . Anxiety   . Aortic valve calcification   . Asthma    Advair and Spirva daily  . Asthma   . Bipolar disorder ([place])   . CAD (coronary artery disease)    a. LHC 11/2013 done for CP/fluid retention: mild disease in prox LAD, mild-mod disease in mRCA, EF 60% with normal LVEDP. b. Normal nuc 03/2016.  Marland Kitchen CHF (congestive heart failure) ([place])   . Chronic diastolic CHF (congestive heart failure) ([place])   . CKD (chronic kidney disease), stage II   . COPD (chronic obstructive pulmonary disease) (H[facility])    a. nocturnal O2.  Marland Kitchen COPD (chronic obstructive pulmonary disease) ([place])   . Coronary artery disease   . Diabetes mellitus   . Diabetes mellitus without complication ([place])   . Dyspnea   . Family history of adverse reaction to anesthesia    mom gets nauseated  . GERD (gastroesophageal reflux disease)    takes Pepcid daily  . History of blood clots    left leg 3-19yrs ago  . Hyperlipidemia   . Hypertension   . Hypertriglyceridemia   . Inguinal hernia, left 01/2015  . Muscle spasm   . Peripheral neuropathy   . RBBB   . Seizures ([place])   . Sinus tachycardia    a. persistent since 2009.  Marland Kitchen Smokers' cough ([place])   . Stroke [facility]) 1989   left sided weakness  . TIA (transient ischemic attack)   . Tobacco abuse      Past Surgical History:  Procedure Laterality Date  . HERNIA REPAIR    . INGUINAL HERNIA REPAIR Left 04/08/2015   Procedure: OPEN LEFT INGUINAL HERNIA REPAIR WITH MESH;  Surgeon: Ralene Ok, MD;  Location: [place];  Service: General;  Laterality: Left;  . INSERTION OF MESH Left 04/08/2015   Procedure: INSERTION OF MESH;  Surgeon: Ralene Ok, MD;  Location: [place];  Service: General;  Laterality: Left;  . LAPAROSCOPY     Endometriosis  . LEFT HEART CATHETERIZATION WITH CORONARY ANGIOGRAM N/A 12/05/2013   Procedure: LEFT HEART CATHETERIZATION WITH CORONARY ANGIOGRAM;  Surgeon: Jettie Booze, MD;  Location: [facility] CATH LAB;  Service: Cardiovascular;  Laterality: N/A;  . right kidney drained    . TEE WITHOUT CARDIOVERSION N/A 11/28/2013   Procedure: TRANSESOPHAGEAL ECHOCARDIOGRAM (TEE);  Surgeon: Thayer Headings, MD;  Location: [facility] ENDOSCOPY;  Service: Cardiovascular;  Laterality: N/A;   Family History  Problem Relation Age of Onset  . Venous thrombosis Brother   . Other Brother        BRAIN TUMOR  . Asthma Father   . Diabetes Father   . Coronary artery disease Mother   . Hypertension Mother   . Diabetes Mother   . Asthma Sister   .  Diabetes type II Brother    Social History   Tobacco Use  . Smoking status: Current Some Day Smoker    Packs/day: 0.50    Years: 30.00    Pack years: 15.00    Types: Cigarettes    Last attempt to quit: 01/08/2017    Years since quitting: 0.5  . Smokeless tobacco: Never Used  Substance Use Topics  . Alcohol use: Yes  . Drug use: No      Review of Systems Per HPI    Objective:   Physical Exam  Constitutional: She is oriented to person, place, and time. She appears well-developed and well-nourished. No distress.  HENT:  Head: Normocephalic and atraumatic.  Right Ear: Tympanic membrane, external ear and ear canal normal.  Left Ear: Tympanic membrane, external ear and ear canal normal.  Nose: Mucosal edema and rhinorrhea present.   Mouth/Throat: Uvula is midline and oropharynx is clear and moist. No oropharyngeal exudate or posterior oropharyngeal edema.  Eyes: Conjunctivae are normal.  Neck: Normal range of motion. Neck supple.  Cardiovascular: Normal rate, regular rhythm and normal heart sounds.  Pulmonary/Chest: Effort normal. She has wheezes (scattered expiratory. Some improvement after albuterol inhaler x 2 puffs. ).  Neurological: She is alert and oriented to person, place, and time.  Skin: Skin is warm and dry. She is not diaphoretic.  Psychiatric: She has a normal mood and affect. Her behavior is normal. Judgment and thought content normal.  Vitals reviewed.     BP 132/70   Pulse (!) 108   Temp 98 F (36.7 C) (Oral)   Wt 277 lb 4 oz (125.8 kg)   LMP 11/11/2011   SpO2 94%   BMI 44.75 kg/m  Wt Readings from Last 3 Encounters:  07/28/17 277 lb 4 oz (125.8 kg)  07/28/17 275 lb 3.2 oz (124.8 kg)  07/19/17 299 lb (135.6 kg)       Assessment & Plan:  1. URI with cough and congestion - likely viral, feels better today, provided WAS antibiotic for doxy 100 mg po BID x 7 days - Provided written and verbal information regarding diagnosis and treatment. - RTC instructions reviewed - benzonatate (TESSALON) 100 MG capsule; Take 1 capsule (100 mg total) by mouth 3 (three) times daily as needed for cough.  Dispense: 20 capsule; Refill: 0   Clarene Reamer, FNP-BC  [facility] [facility]  07/28/2017 2:19 PM

## 2017-07-28 NOTE — Patient Instructions (Addendum)
check your blood sugar twice a day.  vary the time of day when you check, between before the 3 meals, and at bedtime.  also check if you have symptoms of your blood sugar being too high or too low.  please keep a record of the readings and bring it to your next appointment here (or you can bring the meter itself).  You can write it on any piece of paper.  please call us sooner if your blood sugar goes below 70, or if you have a lot of readings over 200. For now, please:  Increase the U-500 to 300 units with breakfast and 300 units with supper, and:  Please continue the same other diabetes medications Please call or message [place] next week, to tell us how the blood sugar is doing.   Please come back for a follow-up appointment in 1 month.

## 2017-07-28 NOTE — Patient Instructions (Addendum)
Can use your albuterol inhaler every 4-6 hours as needed for cough/wheeze. Use your nebulizer when you are able.   Start antibiotic if not better in 2 days.

## 2017-07-31 ENCOUNTER — Telehealth: Payer: Self-pay | Admitting: Endocrinology

## 2017-07-31 ENCOUNTER — Other Ambulatory Visit: Payer: Self-pay

## 2017-07-31 MED ORDER — GLUCOSE BLOOD VI STRP: ORAL_STRIP | 12 refills | Status: DC

## 2017-07-31 NOTE — Telephone Encounter (Signed)
Done

## 2017-07-31 NOTE — Telephone Encounter (Signed)
Patient needs script for OneTouch Ultra 2 strips sent to [facility] on [address] in [place]

## 2017-08-01 ENCOUNTER — Other Ambulatory Visit: Payer: Self-pay

## 2017-08-01 MED ORDER — GLUCOSE BLOOD VI STRP: ORAL_STRIP | 12 refills | Status: DC

## 2017-08-02 ENCOUNTER — Telehealth (HOSPITAL_COMMUNITY): Payer: Self-pay

## 2017-08-02 ENCOUNTER — Encounter: Payer: Self-pay | Admitting: Primary Care

## 2017-08-02 ENCOUNTER — Ambulatory Visit (INDEPENDENT_AMBULATORY_CARE_PROVIDER_SITE_OTHER): Payer: BLUE CROSS/BLUE SHIELD | Admitting: Primary Care

## 2017-08-02 VITALS — BP 108/64 | HR 104 | Temp 97.6°F | Wt 284.0 lb

## 2017-08-02 DIAGNOSIS — J440 Chronic obstructive pulmonary disease with acute lower respiratory infection: Secondary | ICD-10-CM | POA: Diagnosis not present

## 2017-08-02 DIAGNOSIS — J441 Chronic obstructive pulmonary disease with (acute) exacerbation: Secondary | ICD-10-CM

## 2017-08-02 DIAGNOSIS — J209 Acute bronchitis, unspecified: Secondary | ICD-10-CM

## 2017-08-02 DIAGNOSIS — G2581 Restless legs syndrome: Secondary | ICD-10-CM | POA: Insufficient documentation

## 2017-08-02 MED ORDER — IPRATROPIUM-ALBUTEROL 0.5-2.5 (3) MG/3ML IN SOLN: 3.0000 mL | RESPIRATORY_TRACT | 0 refills | Status: DC | PRN

## 2017-08-02 MED ORDER — ROPINIROLE HCL 0.5 MG PO TABS: 0.5000 mg | ORAL_TABLET | Freq: Every day | ORAL | 0 refills | Status: DC

## 2017-08-02 NOTE — Patient Instructions (Addendum)
Stop using the plain albuterol liquid medication.  Start using ipratropium-albuterol liquid medication. Use every 4 hours as needed for wheezing and shortness of breath. Do this for one week max.  Continue Doxycycline antibiotic.  Please go to the hospital if your symptoms of wheezing and shortness of breath get worse.  Call the Heart doctor if your weight doesn't decrease by tomorrow.  It was a pleasure to see you today!

## 2017-08-02 NOTE — Telephone Encounter (Signed)
Called to follow up with patient in regards to Pulmonary Rehab - Patient stated she has Bronchitis and would like for me to follow up on Friday.

## 2017-08-02 NOTE — Assessment & Plan Note (Signed)
Managed on Requip for which she is taken for years.  Without her medication she will experience moderate movement of her legs which prevent her from sleeping.

## 2017-08-02 NOTE — Assessment & Plan Note (Signed)
Exam overall stable.  Mild wheezing on exam, however she is not in distress and her effort of breathing is normal.  Pulse ox of 95% on room air. Avoid prednisone given "anaphylaxis" history.  She is a poor historian and really cannot remember what happened with prednisone, for this reason we will continue to avoid.  She can tolerate Symbicort.  Continue Symbicort as prescribed. Change nebulized treatments to albuterol with Atrovent, discussed instructions for administration.   Also discussed to closely monitor weight and notify cardiologist if no improvement in weight overnight.  This does seem more respiratory than cardiac.  Strict emergency department precautions provided.  Again emphasized importance of tobacco cessation.

## 2017-08-02 NOTE — Progress Notes (Signed)
Subjective:    Patient ID: [name], female    DOB: [birth year], 47 y.o.   MRN: [id number]  HPI  [name] is a 47 year old female with a significant health history including congestive heart failure, COPD, essential hypertension, type 2 diabetes uncontrolled who presents today for follow up of cough.   She was last evaluated on 07/28/17 with a five day history of cough, congestion, sore throat, runny nose. She was diagnosed with a viral URI and was provided with a prescription for Doxycycline 100 mg capsules to fill if no improvement.  Since her last visit she's started the Doxycycline antibiotic last week and is compliant. She's also using the [employer] She has noticed some improvement but notices congestion, wheezing, shortness of breath, cough. She's using Symbicort as prescribed and also her nebulized albuterol which was provided during her last emergency department visit 3-4 times daily.  She's been weighing herself daily and has increased 4 pounds in four days. She's currently taking Torsemide 10 mg three times daily and metolazone 2.5 mg daily.   Review of Systems  Constitutional: Negative for chills and fever.  HENT: Positive for congestion.   Respiratory: Positive for cough, shortness of breath and wheezing.   Cardiovascular: Negative for chest pain.       Past Medical History:  Diagnosis Date  . Anxiety    takes Prozac daily  . Anxiety   . Aortic valve calcification   . Asthma    Advair and Spirva daily  . Asthma   . Bipolar disorder ([place])   . CAD (coronary artery disease)    a. LHC 11/2013 done for CP/fluid retention: mild disease in prox LAD, mild-mod disease in mRCA, EF 60% with normal LVEDP. b. Normal nuc 03/2016.  Marland Kitchen CHF (congestive heart failure) ([place])   . Chronic diastolic CHF (congestive heart failure) ([place])   . CKD (chronic kidney disease), stage II   . COPD (chronic obstructive pulmonary disease) ([facility])    a. nocturnal O2.  Marland Kitchen COPD (chronic  obstructive pulmonary disease) ([place])   . Coronary artery disease   . Diabetes mellitus   . Diabetes mellitus without complication ([place])   . Dyspnea   . Family history of adverse reaction to anesthesia    mom gets nauseated  . GERD (gastroesophageal reflux disease)    takes Pepcid daily  . History of blood clots    left leg 3-15yrs ago  . Hyperlipidemia   . Hypertension   . Hypertriglyceridemia   . Inguinal hernia, left 01/2015  . Muscle spasm   . Peripheral neuropathy   . RBBB   . Seizures ([place])   . Sinus tachycardia    a. persistent since 2009.  Marland Kitchen Smokers' cough ([place])   . Stroke [facility]) 1989   left sided weakness  . TIA (transient ischemic attack)   . Tobacco abuse      Social History   Socioeconomic History  . Marital status: Married    Spouse name: Not on file  . Number of children: Not on file  . Years of education: Not on file  . Highest education level: Not on file  Social Needs  . Financial resource strain: Not on file  . Food insecurity - worry: Not on file  . Food insecurity - inability: Not on file  . Transportation needs - medical: Not on file  . Transportation needs - non-medical: Not on file  Occupational History  . Not on file  Tobacco Use  .  Smoking status: Current Some Day Smoker    Packs/day: 0.50    Years: 30.00    Pack years: 15.00    Types: Cigarettes    Last attempt to quit: 01/08/2017    Years since quitting: 0.5  . Smokeless tobacco: Never Used  Substance and Sexual Activity  . Alcohol use: No    Frequency: Never  . Drug use: No  . Sexual activity: No  Other Topics Concern  . Not on file  Social History Narrative   ** Merged History Encounter **       Lives in [place]   Married but lives with Boyfriend - not legally separated   Disabled - for BiPolar, Seizure disorder, diabetes   Formerly worked at [employer]          Past Surgical History:  Procedure Laterality Date  . HERNIA REPAIR    . INGUINAL HERNIA REPAIR Left 04/08/2015    Procedure: OPEN LEFT INGUINAL HERNIA REPAIR WITH MESH;  Surgeon: Ralene Ok, MD;  Location: [facility];  Service: General;  Laterality: Left;  . INSERTION OF MESH Left 04/08/2015   Procedure: INSERTION OF MESH;  Surgeon: Ralene Ok, MD;  Location: [facility];  Service: General;  Laterality: Left;  . LAPAROSCOPY     Endometriosis  . LEFT HEART CATHETERIZATION WITH CORONARY ANGIOGRAM N/A 12/05/2013   Procedure: LEFT HEART CATHETERIZATION WITH CORONARY ANGIOGRAM;  Surgeon: Jettie Booze, MD;  Location: [facility] CATH LAB;  Service: Cardiovascular;  Laterality: N/A;  . right kidney drained    . TEE WITHOUT CARDIOVERSION N/A 11/28/2013   Procedure: TRANSESOPHAGEAL ECHOCARDIOGRAM (TEE);  Surgeon: Thayer Headings, MD;  Location: [facility] ENDOSCOPY;  Service: Cardiovascular;  Laterality: N/A;    Family History  Problem Relation Age of Onset  . Venous thrombosis Brother   . Other Brother        BRAIN TUMOR  . Asthma Father   . Diabetes Father   . Coronary artery disease Mother   . Hypertension Mother   . Diabetes Mother   . Asthma Sister   . Diabetes type II Brother     Allergies  Allergen Reactions  . Metoprolol Shortness Of Breath    Occurrence of shortness of breath after 3 days  . Montelukast Shortness Of Breath  . Morphine Sulfate Shortness Of Breath and Nausea And Vomiting    Swollen Throat - Able to tolerate dilaudid  . Penicillins Hives and Shortness Of Breath    Swelling on throat Has patient had a PCN reaction causing immediate rash, facial/tongue/throat swelling, SOB or lightheadedness with hypotension: Yes Has patient had a PCN reaction causing severe rash involving mucus membranes or skin necrosis: No Has patient had a PCN reaction that required hospitalization Yes Has patient had a PCN reaction occurring within the last 10 years: No If all of the above answers are "NO", then may proceed with Cephalosporin use.   . Prednisone Anaphylaxis  . Diltiazem Swelling  . Gabapentin  Swelling    Current Outpatient Medications on File Prior to Visit  Medication Sig Dispense Refill  . acetaminophen (TYLENOL) 325 MG tablet Take 2 tablets (650 mg total) by mouth every 4 (four) hours as needed for headache or mild pain. 30 tablet 2  . ALPRAZolam (XANAX) 1 MG tablet Take 1 mg by mouth 2 (two) times daily.     Marland Kitchen aspirin EC 81 MG tablet Take 81 mg by mouth every morning.    Marland Kitchen atorvastatin (LIPITOR) 40 MG tablet TAKE 1 TABLET BY  MOUTH DAILY 90 tablet 2  . benzonatate (TESSALON) 100 MG capsule Take 1 capsule (100 mg total) by mouth 3 (three) times daily as needed for cough. 20 capsule 0  . budesonide-formoterol (SYMBICORT) 80-4.5 MCG/ACT inhaler INHALE 2 PUFFS EVERY 12 HOURS TO PREVENTCOUGH OR WHEEZING--RINSE, GARGLE, & SPITAFTER EACH USE. 10.2 g 4  . busPIRone (BUSPAR) 30 MG tablet Take 1 tablet (30 mg total) by mouth 2 (two) times daily. 60 tablet 0  . doxycycline (VIBRAMYCIN) 100 MG capsule Take 1 capsule (100 mg total) by mouth 2 (two) times daily. 14 capsule 0  . FLUoxetine (PROZAC) 40 MG capsule Take 40 mg by mouth daily.    Marland Kitchen glucose blood (ONE TOUCH ULTRA TEST) test strip Used to check blood sugars 2x daily. 100 each 12  . ibuprofen (ADVIL,MOTRIN) 800 MG tablet Take 1 tablet by mouth every 8 hours as needed for moderate pain. 30 tablet 0  . insulin regular human CONCENTRATED (HUMULIN R U-500 KWIKPEN) 500 UNIT/ML kwikpen 300 units with breakfast, and 300 units with supper, and pen needles 3/day. 15 pen 11  . linaclotide (LINZESS) 290 MCG CAPS capsule Take 1 capsule (290 mcg total) by mouth daily before breakfast. 90 capsule 0  . liraglutide (VICTOZA) 18 MG/3ML SOPN Inject 1.8 mg into the skin every morning.    . lubiprostone (AMITIZA) 24 MCG capsule Take 1 capsule (24 mcg total) by mouth 2 (two) times daily with a meal. 60 capsule 3  . metolazone (ZAROXOLYN) 2.5 MG tablet Take 2.5 mg by mouth every other day. Take 2.5 mg by mouth every 48 hours on Tuesday, Thursday and Saturday     . nicotine (NICODERM CQ - DOSED IN MG/24 HOURS) 21 mg/24hr patch Place 1 patch (21 mg total) at bedtime onto the skin. 28 patch 1  . ondansetron (ZOFRAN) 4 MG tablet Take 4 mg by mouth every 6 (six) hours as needed for nausea or vomiting.    . OXYGEN Inhale 4 L into the lungs at bedtime.     Marland Kitchen POTASSIUM CHLORIDE ER PO Take 20 mEq by mouth daily.    . pregabalin (LYRICA) 300 MG capsule Take 1 capsule (300 mg total) by mouth 2 (two) times daily. 180 capsule 0  . PROVENTIL HFA 108 (90 Base) MCG/ACT inhaler INHALE TWO (2) PUFFS EVERY FOUR HOURS ASNEEDED FOR SHORTNESS OF BREATH 6.7 g 2  . QUEtiapine (SEROQUEL XR) 400 MG 24 hr tablet Take 400 mg by mouth at bedtime. Patient is weaning off of medication per psychiatrist.  She is only taking 1/2 pill at bedtime.    . ranitidine (ZANTAC) 150 MG tablet Take 150 mg by mouth 2 (two) times daily.     Marland Kitchen spironolactone (ALDACTONE) 50 MG tablet Take 50 mg by mouth daily.    Marland Kitchen torsemide (DEMADEX) 10 MG tablet Take 3 tablets by mouth once daily for swelling and fluid retention. (Patient taking differently: Take 20 mg by mouth 2 (two) times daily. Take 1 tablet twice a day as needed for leg edema) 90 tablet 0  . traZODone (DESYREL) 50 MG tablet Take 50 mg by mouth at bedtime. Take 1 - 3 tablets daily by mouth at bedtime    . Vitamin D, Ergocalciferol, (DRISDOL) 50000 units CAPS capsule Take 50,000 Units by mouth every 7 (seven) days. Takes each Wednesday before breakfast weekly.      No current facility-administered medications on file prior to visit.     BP 108/64   Pulse (!) 104  Temp 97.6 F (36.4 C) (Oral)   Wt 284 lb (128.8 kg)   LMP 11/11/2011   SpO2 95%   BMI 45.84 kg/m    Objective:   Physical Exam  Constitutional: She appears well-nourished. She does not appear ill. No distress.  Neck: Neck supple.  Cardiovascular: Regular rhythm.  Pulmonary/Chest: Effort normal. She has no decreased breath sounds. She has wheezes in the right upper field,  the right lower field, the left upper field and the left lower field.  Mild wheezing throughout  Skin: Skin is warm and dry.          Assessment & Plan:

## 2017-08-10 ENCOUNTER — Telehealth (HOSPITAL_COMMUNITY): Payer: Self-pay

## 2017-08-10 NOTE — Telephone Encounter (Signed)
Attempted to call patient to follow up in regards to Pulmonary Rehab - lm on vm

## 2017-08-16 ENCOUNTER — Other Ambulatory Visit: Payer: Self-pay

## 2017-08-16 ENCOUNTER — Telehealth: Payer: Self-pay | Admitting: Endocrinology

## 2017-08-16 MED ORDER — GLUCOSE BLOOD VI STRP: ORAL_STRIP | 0 refills | Status: DC

## 2017-08-16 NOTE — Telephone Encounter (Signed)
Please verify a pen lasts approx 2 days. Then Increase the U-500 to 400 units with breakfast and 400 units with supper.  Please call or message [place] next week, to tell us how the blood sugar is doing

## 2017-08-16 NOTE — Telephone Encounter (Signed)
Pt is calling to give Blood sugar readings. She states they are running in the 590s.  please advise

## 2017-08-16 NOTE — Telephone Encounter (Signed)
2/6- 475 fasting 2/5- 218 fasting, 378 pm 2/4- 549 fasting, 600pm  Has not changed her meals and does not eat really any fried foods or sweats. Has not had Victoza since Monday she is picking it up this evening. Before she ran out her sugars were 300s. Please advise on any changes to be made.

## 2017-08-17 ENCOUNTER — Telehealth (HOSPITAL_COMMUNITY): Payer: Self-pay

## 2017-08-17 ENCOUNTER — Other Ambulatory Visit: Payer: Self-pay

## 2017-08-17 ENCOUNTER — Telehealth: Payer: Self-pay

## 2017-08-17 NOTE — Telephone Encounter (Signed)
Called to follow up with patient in regards to Pulmonary rehab - patient stated she is not ready. I explained to patient our scheduling process. Patient will call if she decides to do program. Closed referral.

## 2017-08-17 NOTE — Telephone Encounter (Signed)
Patient called and stated nurse called her and left a message about her Humulin R but did not know what else was said but she was told to call back

## 2017-08-17 NOTE — Telephone Encounter (Signed)
I have called patient & she also just got back prescription for victoza. She will try dosage increase though & let us know how blood sugars are next week.

## 2017-08-17 NOTE — Telephone Encounter (Signed)
I called LVM for patient to call back for new dosage increase.

## 2017-08-18 DIAGNOSIS — I10 Essential (primary) hypertension: Secondary | ICD-10-CM | POA: Diagnosis not present

## 2017-08-18 DIAGNOSIS — F17200 Nicotine dependence, unspecified, uncomplicated: Secondary | ICD-10-CM | POA: Diagnosis not present

## 2017-08-18 DIAGNOSIS — I5030 Unspecified diastolic (congestive) heart failure: Secondary | ICD-10-CM | POA: Diagnosis not present

## 2017-08-22 ENCOUNTER — Telehealth: Payer: Self-pay

## 2017-08-22 MED ORDER — INSULIN REGULAR HUMAN (CONC) 500 UNIT/ML ~~LOC~~ SOPN: PEN_INJECTOR | SUBCUTANEOUS | 11 refills | Status: DC

## 2017-08-22 NOTE — Addendum Note (Signed)
Addended by: Renato Shin on: 08/22/2017 04:58 PM   Modules accepted: Orders

## 2017-08-22 NOTE — Telephone Encounter (Signed)
Patient called to give bs sugar readings they are as follows: 08/17/17-7:30 am-317 08/18/17-12 noon-474 08/18/17- 6 pm-400 08/19/17- 7:30 am 321 08/19/17- 6 pm-268 08/20/17-7:30 am-353 08/20/17- evening 395 08/22/17- am- 207  Patient would like a call back to let her know if any changes need to be made to her medications because her readings are so high

## 2017-08-22 NOTE — Telephone Encounter (Signed)
Please verify a pen lasts approx 2 1/2 days. Then please increase to 350 units BID. Please call or message [place] next week, to tell us how the blood sugar is doing

## 2017-08-23 ENCOUNTER — Other Ambulatory Visit: Payer: Self-pay

## 2017-08-23 ENCOUNTER — Telehealth: Payer: Self-pay | Admitting: Primary Care

## 2017-08-23 MED ORDER — INSULIN PEN NEEDLE 32G X 4 MM MISC: 11 refills | Status: DC

## 2017-08-23 MED ORDER — GLUCOSE BLOOD VI STRP: ORAL_STRIP | 4 refills | Status: DC

## 2017-08-23 MED ORDER — INSULIN REGULAR HUMAN (CONC) 500 UNIT/ML ~~LOC~~ SOPN: PEN_INJECTOR | SUBCUTANEOUS | 11 refills | Status: DC

## 2017-08-23 NOTE — Telephone Encounter (Signed)
I have sent prescription for patient to get test strips. Also patient will call with blood sugars to let us know how new dose is doing & if blood sugars are coming down.

## 2017-08-23 NOTE — Telephone Encounter (Signed)
The pharmacy is requesting a separate prescription for the pen needles. Also the quantity for the Humulan needs to be changed from 16 to 42 in order for the insurance to cover it. A new prescription needs to be resent.

## 2017-08-23 NOTE — Telephone Encounter (Signed)
I have changed amount for Humulin insulin & resent. I have also ordered pen needles.

## 2017-08-24 ENCOUNTER — Telehealth: Payer: Self-pay | Admitting: Endocrinology

## 2017-08-24 NOTE — Telephone Encounter (Signed)
Please increase insulin as we discussed, and call Monday (sooner if necessary), to report cbg's.

## 2017-08-24 NOTE — Telephone Encounter (Signed)
I called and spoke with [name] although she had already seen patient this afternoon. She wanted to just make sure you were aware that patient's blood sugars were higher than usual, although they always run high.

## 2017-08-24 NOTE — Telephone Encounter (Signed)
OK 

## 2017-08-24 NOTE — Telephone Encounter (Signed)
Antonieta Iba RN with [facility] # [phone]  Please call [name] back she states there has been a delay in her getting insulin from her pharmacy due to some issues with the pharmacy for 48 hours. This AM her BS reading was 566 at about 730 am and once she was able to get her insulin from the new pharmacy today at 9 she took 400 u of humulin r and checked it again about 1230 today BS was 442. [name] is asking if the pt can check her BS again later today to monitor her sugars and if there is any adjustments that need to be made. Thank you

## 2017-09-01 ENCOUNTER — Encounter: Payer: Self-pay | Admitting: Endocrinology

## 2017-09-01 ENCOUNTER — Ambulatory Visit (INDEPENDENT_AMBULATORY_CARE_PROVIDER_SITE_OTHER): Payer: BLUE CROSS/BLUE SHIELD | Admitting: Endocrinology

## 2017-09-01 VITALS — BP 91/61 | HR 107 | Wt 285.8 lb

## 2017-09-01 DIAGNOSIS — Z794 Long term (current) use of insulin: Secondary | ICD-10-CM

## 2017-09-01 DIAGNOSIS — N183 Chronic kidney disease, stage 3 (moderate): Secondary | ICD-10-CM | POA: Diagnosis not present

## 2017-09-01 DIAGNOSIS — E1122 Type 2 diabetes mellitus with diabetic chronic kidney disease: Secondary | ICD-10-CM

## 2017-09-01 MED ORDER — INSULIN REGULAR HUMAN (CONC) 500 UNIT/ML ~~LOC~~ SOPN: 400.0000 [IU] | PEN_INJECTOR | Freq: Two times a day (BID) | SUBCUTANEOUS | 11 refills | Status: DC

## 2017-09-01 NOTE — Progress Notes (Signed)
Subjective:    Patient ID: [name], female    DOB: 02/02/1971, 47 y.o.   MRN: [id number]  HPI Pt returns for f/u of diabetes mellitus: DM type: Insulin-requiring type 2 Dx'ed: 1988.  Complications: polyneuropathy, TIA, renal insuff, and DR Therapy: insulin since soon after dx (also takes victoza, metformin, and farxiga) GDM: never DKA: never Severe hypoglycemia: last episode was mid-2018 Pancreatitis: never Pancreatic imaging: normal on 2011 CT Other: she takes multiple daily injections; she takes U-500, due to severe insulin resistance.  She does not eat lunch, so she does not take insulin then Interval history:  1 pen lasts approx 2 days.  she brings a record of her cbg's which I have reviewed today.  It varies from 250-450. pt states she feels well in general, except for fatigue.  Past Medical History:  Diagnosis Date  . Anxiety    takes Prozac daily  . Anxiety   . Aortic valve calcification   . Asthma    Advair and Spirva daily  . Asthma   . Bipolar disorder ([place])   . CAD (coronary artery disease)    a. LHC 11/2013 done for CP/fluid retention: mild disease in prox LAD, mild-mod disease in mRCA, EF 60% with normal LVEDP. b. Normal nuc 03/2016.  Marland Kitchen CHF (congestive heart failure) ([place])   . Chronic diastolic CHF (congestive heart failure) ([place])   . CKD (chronic kidney disease), stage II   . COPD (chronic obstructive pulmonary disease) ([facility])    a. nocturnal O2.  Marland Kitchen COPD (chronic obstructive pulmonary disease) ([place])   . Coronary artery disease   . Diabetes mellitus   . Diabetes mellitus without complication ([place])   . Dyspnea   . Family history of adverse reaction to anesthesia    mom gets nauseated  . GERD (gastroesophageal reflux disease)    takes Pepcid daily  . History of blood clots    left leg 3-12yrs ago  . Hyperlipidemia   . Hypertension   . Hypertriglyceridemia   . Inguinal hernia, left 01/2015  . Muscle spasm   . Peripheral neuropathy   . RBBB   .  Seizures ([place])   . Sinus tachycardia    a. persistent since 2009.  Marland Kitchen Smokers' cough ([place])   . Stroke [facility]) 1989   left sided weakness  . TIA (transient ischemic attack)   . Tobacco abuse     Past Surgical History:  Procedure Laterality Date  . HERNIA REPAIR    . INGUINAL HERNIA REPAIR Left 04/08/2015   Procedure: OPEN LEFT INGUINAL HERNIA REPAIR WITH MESH;  Surgeon: Ralene Ok, MD;  Location: [facility];  Service: General;  Laterality: Left;  . INSERTION OF MESH Left 04/08/2015   Procedure: INSERTION OF MESH;  Surgeon: Ralene Ok, MD;  Location: [facility];  Service: General;  Laterality: Left;  . LAPAROSCOPY     Endometriosis  . LEFT HEART CATHETERIZATION WITH CORONARY ANGIOGRAM N/A 12/05/2013   Procedure: LEFT HEART CATHETERIZATION WITH CORONARY ANGIOGRAM;  Surgeon: Jettie Booze, MD;  Location: [facility] CATH LAB;  Service: Cardiovascular;  Laterality: N/A;  . right kidney drained    . TEE WITHOUT CARDIOVERSION N/A 11/28/2013   Procedure: TRANSESOPHAGEAL ECHOCARDIOGRAM (TEE);  Surgeon: Thayer Headings, MD;  Location: [facility] ENDOSCOPY;  Service: Cardiovascular;  Laterality: N/A;    Social History   Socioeconomic History  . Marital status: Married    Spouse name: Not on file  . Number of children: Not on file  . Years of education:  Not on file  . Highest education level: Not on file  Social Needs  . Financial resource strain: Not on file  . Food insecurity - worry: Not on file  . Food insecurity - inability: Not on file  . Transportation needs - medical: Not on file  . Transportation needs - non-medical: Not on file  Occupational History  . Not on file  Tobacco Use  . Smoking status: Current Some Day Smoker    Packs/day: 0.50    Years: 30.00    Pack years: 15.00    Types: Cigarettes    Last attempt to quit: 01/08/2017    Years since quitting: 0.6  . Smokeless tobacco: Never Used  Substance and Sexual Activity  . Alcohol use: No    Frequency: Never  . Drug use: No  .  Sexual activity: No  Other Topics Concern  . Not on file  Social History Narrative   ** Merged History Encounter **       Lives in [place]   Married but lives with Boyfriend - not legally separated   Disabled - for BiPolar, Seizure disorder, diabetes   Formerly worked at [employer]          Current Outpatient Medications on File Prior to Visit  Medication Sig Dispense Refill  . acetaminophen (TYLENOL) 325 MG tablet Take 2 tablets (650 mg total) by mouth every 4 (four) hours as needed for headache or mild pain. 30 tablet 2  . ALPRAZolam (XANAX) 1 MG tablet Take 1 mg by mouth 2 (two) times daily.     Marland Kitchen aspirin EC 81 MG tablet Take 81 mg by mouth every morning.    Marland Kitchen atorvastatin (LIPITOR) 40 MG tablet TAKE 1 TABLET BY MOUTH DAILY 90 tablet 2  . benzonatate (TESSALON) 100 MG capsule Take 1 capsule (100 mg total) by mouth 3 (three) times daily as needed for cough. 20 capsule 0  . budesonide-formoterol (SYMBICORT) 80-4.5 MCG/ACT inhaler INHALE 2 PUFFS EVERY 12 HOURS TO PREVENTCOUGH OR WHEEZING--RINSE, GARGLE, & SPITAFTER EACH USE. 10.2 g 4  . busPIRone (BUSPAR) 30 MG tablet Take 1 tablet (30 mg total) by mouth 2 (two) times daily. 60 tablet 0  . doxycycline (VIBRAMYCIN) 100 MG capsule Take 1 capsule (100 mg total) by mouth 2 (two) times daily. 14 capsule 0  . FLUoxetine (PROZAC) 40 MG capsule Take 40 mg by mouth daily.    Marland Kitchen glucose blood (ONE TOUCH ULTRA TEST) test strip Used to check blood sugars 4x daily. 200 each 4  . ibuprofen (ADVIL,MOTRIN) 800 MG tablet Take 1 tablet by mouth every 8 hours as needed for moderate pain. 30 tablet 0  . Insulin Pen Needle 32G X 4 MM MISC Used to give insulin injections twice daily. 100 each 11  . ipratropium-albuterol (DUONEB) 0.5-2.5 (3) MG/3ML SOLN Take 3 mLs by nebulization every 4 (four) hours as needed (wheezing/shortness of breath). 360 mL 0  . linaclotide (LINZESS) 290 MCG CAPS capsule Take 1 capsule (290 mcg total) by mouth daily before breakfast. 90  capsule 0  . liraglutide (VICTOZA) 18 MG/3ML SOPN Inject 1.8 mg into the skin every morning.    . lubiprostone (AMITIZA) 24 MCG capsule Take 1 capsule (24 mcg total) by mouth 2 (two) times daily with a meal. 60 capsule 3  . metolazone (ZAROXOLYN) 2.5 MG tablet Take 2.5 mg by mouth every other day. Take 2.5 mg by mouth every 48 hours on Tuesday, Thursday and Saturday    . nicotine (NICODERM CQ -  DOSED IN MG/24 HOURS) 21 mg/24hr patch Place 1 patch (21 mg total) at bedtime onto the skin. 28 patch 1  . ondansetron (ZOFRAN) 4 MG tablet Take 4 mg by mouth every 6 (six) hours as needed for nausea or vomiting.    . OXYGEN Inhale 4 L into the lungs at bedtime.     Marland Kitchen POTASSIUM CHLORIDE ER PO Take 20 mEq by mouth daily.    . pregabalin (LYRICA) 300 MG capsule Take 1 capsule (300 mg total) by mouth 2 (two) times daily. 180 capsule 0  . PROVENTIL HFA 108 (90 Base) MCG/ACT inhaler INHALE TWO (2) PUFFS EVERY FOUR HOURS ASNEEDED FOR SHORTNESS OF BREATH 6.7 g 2  . QUEtiapine (SEROQUEL XR) 400 MG 24 hr tablet Take 400 mg by mouth at bedtime. Patient is weaning off of medication per psychiatrist.  She is only taking 1/2 pill at bedtime.    . ranitidine (ZANTAC) 150 MG tablet Take 150 mg by mouth 2 (two) times daily.     Marland Kitchen rOPINIRole (REQUIP) 0.5 MG tablet Take 1 tablet (0.5 mg total) by mouth at bedtime. 90 tablet 0  . spironolactone (ALDACTONE) 50 MG tablet Take 50 mg by mouth daily.    Marland Kitchen torsemide (DEMADEX) 10 MG tablet Take 3 tablets by mouth once daily for swelling and fluid retention. (Patient taking differently: Take 20 mg by mouth 2 (two) times daily. Take 1 tablet twice a day as needed for leg edema) 90 tablet 0  . traZODone (DESYREL) 50 MG tablet Take 50 mg by mouth at bedtime. Take 1 - 3 tablets daily by mouth at bedtime    . Vitamin D, Ergocalciferol, (DRISDOL) 50000 units CAPS capsule Take 50,000 Units by mouth every 7 (seven) days. Takes each Wednesday before breakfast weekly.      No current  facility-administered medications on file prior to visit.     Allergies  Allergen Reactions  . Metoprolol Shortness Of Breath    Occurrence of shortness of breath after 3 days  . Montelukast Shortness Of Breath  . Morphine Sulfate Shortness Of Breath and Nausea And Vomiting    Swollen Throat - Able to tolerate dilaudid  . Penicillins Hives and Shortness Of Breath    Swelling on throat Has patient had a PCN reaction causing immediate rash, facial/tongue/throat swelling, SOB or lightheadedness with hypotension: Yes Has patient had a PCN reaction causing severe rash involving mucus membranes or skin necrosis: No Has patient had a PCN reaction that required hospitalization Yes Has patient had a PCN reaction occurring within the last 10 years: No If all of the above answers are "NO", then may proceed with Cephalosporin use.   . Prednisone Anaphylaxis  . Diltiazem Swelling  . Gabapentin Swelling    Family History  Problem Relation Age of Onset  . Venous thrombosis Brother   . Other Brother        BRAIN TUMOR  . Asthma Father   . Diabetes Father   . Coronary artery disease Mother   . Hypertension Mother   . Diabetes Mother   . Asthma Sister   . Diabetes type II Brother     BP 91/61 (BP Location: Left Arm, Patient Position: Sitting, Cuff Size: Normal)   Pulse (!) 107   Wt 285 lb 12.8 oz (129.6 kg)   LMP 11/11/2011   SpO2 92%   BMI 46.13 kg/m    Review of Systems She denies hypoglycemia, but she has slight dizziness    Objective:  Physical Exam VITAL SIGNS:  See vs page GENERAL: no distress Pulses: dorsalis pedis intact bilat.   MSK: no deformity of the feet CV: 1+ bilat leg edema Skin:  no ulcer on the feet, but the skin is dry.  normal color and temp on the feet. Neuro: sensation is intact to touch on the feet, but decreased from normal Ext: There is bilateral onychomycosis of the toenails.   Lab Results  Component Value Date   CREATININE 1.38 (H) 07/20/2017    BUN 27 (H) 07/20/2017   NA 132 (L) 07/20/2017   K 3.9 07/20/2017   CL 90 (L) 07/20/2017   CO2 29 07/20/2017       Assessment & Plan:  Hypotension, new to me, poss due to or exac by diuretics.  She may be volume sensitive.   Insulin-requiring type 2 DM, with renal insuff: she needs increased rx   Patient Instructions  check your blood sugar twice a day.  vary the time of day when you check, between before the 3 meals, and at bedtime.  also check if you have symptoms of your blood sugar being too high or too low.  please keep a record of the readings and bring it to your next appointment here (or you can bring the meter itself).  You can write it on any piece of paper.  please call us sooner if your blood sugar goes below 70, or if you have a lot of readings over 200. For now, please:  Increase the insulin to 400 units with breakfast and 400 units with supper, and:  Please continue the same other diabetes medications. Please skip the metolozone, torsemide, and spironolactone for 1 day, then resume. Please call Dr Einar Gip, if the dizziness persists.  Please come back for a follow-up appointment in 6 weeks.

## 2017-09-01 NOTE — Patient Instructions (Addendum)
check your blood sugar twice a day.  vary the time of day when you check, between before the 3 meals, and at bedtime.  also check if you have symptoms of your blood sugar being too high or too low.  please keep a record of the readings and bring it to your next appointment here (or you can bring the meter itself).  You can write it on any piece of paper.  please call us sooner if your blood sugar goes below 70, or if you have a lot of readings over 200. For now, please:  Increase the insulin to 400 units with breakfast and 400 units with supper, and:  Please continue the same other diabetes medications. Please skip the metolozone, torsemide, and spironolactone for 1 day, then resume. Please call Dr Einar Gip, if the dizziness persists.  Please come back for a follow-up appointment in 6 weeks.

## 2017-09-14 ENCOUNTER — Other Ambulatory Visit: Payer: Self-pay | Admitting: Endocrinology

## 2017-09-29 DIAGNOSIS — F410 Panic disorder [episodic paroxysmal anxiety] without agoraphobia: Secondary | ICD-10-CM | POA: Diagnosis not present

## 2017-09-29 DIAGNOSIS — G4709 Other insomnia: Secondary | ICD-10-CM | POA: Diagnosis not present

## 2017-09-29 DIAGNOSIS — F3342 Major depressive disorder, recurrent, in full remission: Secondary | ICD-10-CM | POA: Diagnosis not present

## 2017-10-13 ENCOUNTER — Ambulatory Visit: Payer: BLUE CROSS/BLUE SHIELD | Admitting: Endocrinology
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
## 2017-10-18 DIAGNOSIS — H43813 Vitreous degeneration, bilateral: Secondary | ICD-10-CM | POA: Diagnosis not present

## 2017-10-18 DIAGNOSIS — H4311 Vitreous hemorrhage, right eye: Secondary | ICD-10-CM | POA: Diagnosis not present

## 2017-10-18 DIAGNOSIS — E113513 Type 2 diabetes mellitus with proliferative diabetic retinopathy with macular edema, bilateral: Secondary | ICD-10-CM | POA: Diagnosis not present

## 2017-10-19 ENCOUNTER — Other Ambulatory Visit: Payer: Self-pay

## 2017-10-19 ENCOUNTER — Emergency Department (HOSPITAL_COMMUNITY): Payer: BLUE CROSS/BLUE SHIELD

## 2017-10-19 ENCOUNTER — Encounter (HOSPITAL_COMMUNITY): Payer: Self-pay

## 2017-10-19 ENCOUNTER — Emergency Department (HOSPITAL_COMMUNITY)
Admission: EM | Admit: 2017-10-19 | Discharge: 2017-10-20 | Disposition: A | Discharge status: Home / Self Care | Payer: BLUE CROSS/BLUE SHIELD | Attending: Emergency Medicine | Admitting: Emergency Medicine

## 2017-10-19 DIAGNOSIS — Z79899 Other long term (current) drug therapy: Secondary | ICD-10-CM | POA: Diagnosis not present

## 2017-10-19 DIAGNOSIS — I5032 Chronic diastolic (congestive) heart failure: Secondary | ICD-10-CM | POA: Insufficient documentation

## 2017-10-19 DIAGNOSIS — R0789 Other chest pain: Secondary | ICD-10-CM

## 2017-10-19 DIAGNOSIS — I130 Hypertensive heart and chronic kidney disease with heart failure and stage 1 through stage 4 chronic kidney disease, or unspecified chronic kidney disease: Secondary | ICD-10-CM | POA: Diagnosis not present

## 2017-10-19 DIAGNOSIS — R05 Cough: Secondary | ICD-10-CM | POA: Diagnosis not present

## 2017-10-19 DIAGNOSIS — J449 Chronic obstructive pulmonary disease, unspecified: Secondary | ICD-10-CM | POA: Insufficient documentation

## 2017-10-19 DIAGNOSIS — F17210 Nicotine dependence, cigarettes, uncomplicated: Secondary | ICD-10-CM | POA: Insufficient documentation

## 2017-10-19 DIAGNOSIS — N182 Chronic kidney disease, stage 2 (mild): Secondary | ICD-10-CM | POA: Insufficient documentation

## 2017-10-19 DIAGNOSIS — E119 Type 2 diabetes mellitus without complications: Secondary | ICD-10-CM | POA: Insufficient documentation

## 2017-10-19 DIAGNOSIS — I2510 Atherosclerotic heart disease of native coronary artery without angina pectoris: Secondary | ICD-10-CM | POA: Insufficient documentation

## 2017-10-19 DIAGNOSIS — Z794 Long term (current) use of insulin: Secondary | ICD-10-CM | POA: Insufficient documentation

## 2017-10-19 DIAGNOSIS — Z7982 Long term (current) use of aspirin: Secondary | ICD-10-CM | POA: Diagnosis not present

## 2017-10-19 DIAGNOSIS — R079 Chest pain, unspecified: Secondary | ICD-10-CM | POA: Diagnosis not present

## 2017-10-19 LAB — BASIC METABOLIC PANEL
Anion gap: 18 — ABNORMAL HIGH (ref 5–15)
BUN: 43 mg/dL — ABNORMAL HIGH (ref 6–20)
CO2: 31 mmol/L (ref 22–32)
Calcium: 8.9 mg/dL (ref 8.9–10.3)
Chloride: 82 mmol/L — ABNORMAL LOW (ref 101–111)
Creatinine, Ser: 1.55 mg/dL — ABNORMAL HIGH (ref 0.44–1.00)
GFR calc Af Amer: 45 mL/min — ABNORMAL LOW (ref >60)
GFR calc non Af Amer: 39 mL/min — ABNORMAL LOW (ref >60)
Glucose, Bld: 258 mg/dL — ABNORMAL HIGH (ref 65–99)
Potassium: 3.5 mmol/L (ref 3.5–5.1)
Sodium: 131 mmol/L — ABNORMAL LOW (ref 135–145)

## 2017-10-19 LAB — CBC
HCT: 42 % (ref 36.0–46.0)
Hemoglobin: 13.5 g/dL (ref 12.0–15.0)
MCH: 27.7 pg (ref 26.0–34.0)
MCHC: 32.1 g/dL (ref 30.0–36.0)
MCV: 86.1 fL (ref 78.0–100.0)
Platelets: 236 10*3/uL (ref 150–400)
RBC: 4.88 MIL/uL (ref 3.87–5.11)
RDW: 16.6 % — ABNORMAL HIGH (ref 11.5–15.5)
WBC: 16.3 10*3/uL — ABNORMAL HIGH (ref 4.0–10.5)

## 2017-10-19 LAB — D-DIMER, QUANTITATIVE (NOT AT ARMC): D-Dimer, Quant: 0.42 ug/mL-FEU (ref 0.00–0.50)

## 2017-10-19 LAB — I-STAT BETA HCG BLOOD, ED (MC, WL, AP ONLY): I-stat hCG, quantitative: <5 m[IU]/mL (ref <5)

## 2017-10-19 LAB — I-STAT TROPONIN, ED
Troponin i, poc: 0 ng/mL (ref 0.00–0.08)
Troponin i, poc: 0 ng/mL (ref 0.00–0.08)

## 2017-10-19 LAB — BRAIN NATRIURETIC PEPTIDE: B Natriuretic Peptide: 54.6 pg/mL (ref 0.0–100.0)

## 2017-10-19 MED ORDER — NITROGLYCERIN 0.4 MG SL SUBL: 0.4000 mg | SUBLINGUAL_TABLET | SUBLINGUAL | Status: DC | PRN

## 2017-10-19 NOTE — ED Provider Notes (Signed)
[facility] EMERGENCY DEPARTMENT Provider Note   CSN: [id number] Arrival date & time: 10/19/17  2137     History   Chief Complaint Chief Complaint  Patient presents with  . Chest Pain    HPI [name] is a 48 y.o. female.  The history is provided by the patient.  Chest Pain   This is a new problem. The current episode started 1 to 2 hours ago. The problem has been gradually improving. The pain is associated with rest. The pain is present in the substernal region. The pain is at a severity of 7/10. The pain is moderate. The quality of the pain is described as pressure-like. The pain does not radiate. Pertinent negatives include no abdominal pain, no back pain, no cough, no exertional chest pressure, no fever, no lower extremity edema, no malaise/fatigue, no near-syncope, no numbness, no palpitations, no shortness of breath, no sputum production and no vomiting. She has tried nitroglycerin for the symptoms. The treatment provided mild relief. Risk factors include obesity.  Her past medical history is significant for CAD and CHF.  Pertinent negatives for past medical history include no MI, no PE and no seizures.    Past Medical History:  Diagnosis Date  . Anxiety    takes Prozac daily  . Anxiety   . Aortic valve calcification   . Asthma    Advair and Spirva daily  . Asthma   . Bipolar disorder ([place])   . CAD (coronary artery disease)    a. LHC 11/2013 done for CP/fluid retention: mild disease in prox LAD, mild-mod disease in mRCA, EF 60% with normal LVEDP. b. Normal nuc 03/2016.  Marland Kitchen CHF (congestive heart failure) ([place])   . Chronic diastolic CHF (congestive heart failure) ([place])   . CKD (chronic kidney disease), stage II   . COPD (chronic obstructive pulmonary disease) (HCC)    a. nocturnal O2.  Marland Kitchen COPD (chronic obstructive pulmonary disease) ([place])   . Coronary artery disease   . Diabetes mellitus   . Diabetes mellitus without complication ([place])   . Dyspnea    . Family history of adverse reaction to anesthesia    mom gets nauseated  . GERD (gastroesophageal reflux disease)    takes Pepcid daily  . History of blood clots    left leg 3-54yrs ago  . Hyperlipidemia   . Hypertension   . Hypertriglyceridemia   . Inguinal hernia, left 01/2015  . Muscle spasm   . Peripheral neuropathy   . RBBB   . Seizures ([place])   . Sinus tachycardia    a. persistent since 2009.  Marland Kitchen Smokers' cough ([place])   . Stroke [facility]) 1989   left sided weakness  . TIA (transient ischemic attack)   . Tobacco abuse     Patient Active Problem List   Diagnosis Date Noted  . Restless leg syndrome 08/02/2017  . Nocturnal hypoxemia 03/29/2017  . Vision disturbance 02/28/2017  . CKD (chronic kidney disease), stage II 02/28/2017  . Visual loss, bilateral   . Chronic respiratory failure with hypoxia (HCC)/ nocturnal 02 dep  10/28/2016  . Upper airway cough syndrome 08/22/2016  . Cigarette smoker 06/15/2016  . Simple chronic bronchitis ([place])   . Coronary artery disease involving native heart without angina pectoris 05/16/2016  . Acute on chronic diastolic (congestive) heart failure ([place]) 11/04/2015  . Orthopnea 11/03/2015  . Dyspnea   . Bilateral leg edema   . Diabetes ([place])   . Decreased urine stream   .  COPD exacerbation ([place]) 10/15/2015  . Fracture of rib, closed 10/15/2015  . Venous stasis of both lower extremities 03/29/2015  . Preventative health care 02/04/2015  . Essential hypertension 01/02/2015  . Inguinal hernia 11/27/2014  . Allergic rhinitis with postnasal drip 01/21/2014  . Aortic valve disorders 04/18/2013  . Numbness and tingling 04/01/2013  . Sleep apnea 05/22/2012  . Sinus tachycardia 10/09/2011  . Seizure disorder ([place]) 10/09/2011  . Bipolar disorder ([place]) 10/09/2011  . TIA (transient ischemic attack) 06/22/2011  . Constipation 06/15/2011  . Diabetic polyneuropathy ([place]) 06/08/2010  . HYPERTRIGLYCERIDEMIA 07/17/2007  . COPD GOLD 0  05/30/2007  .  Morbid (severe) obesity due to excess calories ([place]) 04/23/2007  . Tobacco abuse 09/07/2006  . Asthma, persistent 09/07/2006    Past Surgical History:  Procedure Laterality Date  . HERNIA REPAIR    . INGUINAL HERNIA REPAIR Left 04/08/2015   Procedure: OPEN LEFT INGUINAL HERNIA REPAIR WITH MESH;  Surgeon: Ralene Ok, MD;  Location: [place];  Service: General;  Laterality: Left;  . INSERTION OF MESH Left 04/08/2015   Procedure: INSERTION OF MESH;  Surgeon: Ralene Ok, MD;  Location: [place];  Service: General;  Laterality: Left;  . LAPAROSCOPY     Endometriosis  . LEFT HEART CATHETERIZATION WITH CORONARY ANGIOGRAM N/A 12/05/2013   Procedure: LEFT HEART CATHETERIZATION WITH CORONARY ANGIOGRAM;  Surgeon: Jettie Booze, MD;  Location: [facility] CATH LAB;  Service: Cardiovascular;  Laterality: N/A;  . right kidney drained    . TEE WITHOUT CARDIOVERSION N/A 11/28/2013   Procedure: TRANSESOPHAGEAL ECHOCARDIOGRAM (TEE);  Surgeon: Thayer Headings, MD;  Location: [place];  Service: Cardiovascular;  Laterality: N/A;     OB History   None      Home Medications    Prior to Admission medications   Medication Sig Start Date End Date Taking? Authorizing Provider  acetaminophen (TYLENOL) 325 MG tablet Take 2 tablets (650 mg total) by mouth every 4 (four) hours as needed for headache or mild pain. 03/19/17  Yes Patwardhan, Reynold Bowen, MD  ALPRAZolam (XANAX) 1 MG tablet Take 1 mg by mouth 2 (two) times daily.    Yes [provider]  aspirin EC 81 MG tablet Take 81 mg by mouth every morning.   Yes [provider]  benzonatate (TESSALON) 100 MG capsule Take 1 capsule (100 mg total) by mouth 3 (three) times daily as needed for cough. 07/28/17  Yes Elby Beck, FNP  budesonide-formoterol (SYMBICORT) 80-4.5 MCG/ACT inhaler INHALE 2 PUFFS EVERY 12 HOURS TO PREVENTCOUGH OR WHEEZING--RINSE, GARGLE, & SPITAFTER EACH USE. 06/15/17  Yes Tanda Rockers, MD  busPIRone (BUSPAR) 30 MG  tablet Take 1 tablet (30 mg total) by mouth 2 (two) times daily. 05/05/17  Yes Pleas Koch, NP  FLUoxetine (PROZAC) 40 MG capsule Take 40 mg by mouth daily.   Yes [provider]  glucose blood (ONE TOUCH ULTRA TEST) test strip Used to check blood sugars 4x daily. 08/23/17  Yes Renato Shin, MD  ibuprofen (ADVIL,MOTRIN) 800 MG tablet Take 1 tablet by mouth every 8 hours as needed for moderate pain. 07/21/17  Yes Pleas Koch, NP  Insulin Pen Needle 32G X 4 MM MISC Used to give insulin injections twice daily. 08/23/17  Yes Renato Shin, MD  insulin regular human CONCENTRATED (HUMULIN R U-500 KWIKPEN) 500 UNIT/ML kwikpen Inject 400 Units into the skin 2 (two) times daily with a meal. Patient taking differently: Inject 175-300 Units into the skin 2 (two) times daily  with a meal.  09/01/17  Yes Renato Shin, MD  ipratropium-albuterol (DUONEB) 0.5-2.5 (3) MG/3ML SOLN Take 3 mLs by nebulization every 4 (four) hours as needed (wheezing/shortness of breath). 08/02/17  Yes Pleas Koch, NP  linaclotide Rolan Lipa) 290 MCG CAPS capsule Take 1 capsule (290 mcg total) by mouth daily before breakfast. 06/06/17 10/19/17 Yes Jonathon Bellows, MD  metolazone (ZAROXOLYN) 2.5 MG tablet Take 2.5 mg by mouth every other day. Take 2.5 mg by mouth every 48 hours on Tuesday, Thursday and Saturday   Yes [provider]  nicotine (NICODERM CQ - DOSED IN MG/24 HOURS) 21 mg/24hr patch Place 1 patch (21 mg total) at bedtime onto the skin. 05/23/17  Yes Pleas Koch, NP  ondansetron (ZOFRAN) 4 MG tablet Take 4 mg by mouth every 6 (six) hours as needed for nausea or vomiting.   Yes [provider]  OXYGEN Inhale 4 L into the lungs at bedtime.    Yes [provider]  pregabalin (LYRICA) 300 MG capsule Take 1 capsule (300 mg total) by mouth 2 (two) times daily. 07/21/17  Yes Pleas Koch, NP  PRESCRIPTION MEDICATION Place 1 drop into both eyes daily as needed (every 14 weeks  before injections).   Yes [provider]  PROVENTIL HFA 108 (90 Base) MCG/ACT inhaler INHALE TWO (2) PUFFS EVERY FOUR HOURS ASNEEDED FOR SHORTNESS OF BREATH 07/30/15  Yes Gottschalk, Ashly M, DO  ranitidine (ZANTAC) 150 MG tablet Take 150 mg by mouth 2 (two) times daily.    Yes [provider]  rOPINIRole (REQUIP) 0.5 MG tablet Take 1 tablet (0.5 mg total) by mouth at bedtime. 08/02/17  Yes Pleas Koch, NP  spironolactone (ALDACTONE) 50 MG tablet Take 50 mg by mouth daily.   Yes [provider]  torsemide (DEMADEX) 10 MG tablet Take 3 tablets by mouth once daily for swelling and fluid retention. Patient taking differently: Take 20-40 mg by mouth 2 (two) times daily. 40 mg in the morning and 20 mg in the evening 04/04/17  Yes Pleas Koch, NP  traZODone (DESYREL) 50 MG tablet Take 150 mg by mouth at bedtime.    Yes [provider]    Family History Family History  Problem Relation Age of Onset  . Venous thrombosis Brother   . Other Brother        BRAIN TUMOR  . Asthma Father   . Diabetes Father   . Coronary artery disease Mother   . Hypertension Mother   . Diabetes Mother   . Asthma Sister   . Diabetes type II Brother     Social History Social History   Tobacco Use  . Smoking status: Current Some Day Smoker    Packs/day: 0.50    Years: 30.00    Pack years: 15.00    Types: Cigarettes    Last attempt to quit: 01/08/2017    Years since quitting: 0.7  . Smokeless tobacco: Never Used  Substance Use Topics  . Alcohol use: No    Frequency: Never  . Drug use: No     Allergies   Metoprolol; Montelukast; Morphine sulfate; Penicillins; Prednisone; Diltiazem; and Gabapentin   Review of Systems Review of Systems  Constitutional: Negative for chills, fever and malaise/fatigue.  HENT: Negative for ear pain and sore throat.   Eyes: Negative for pain and visual disturbance.  Respiratory: Negative for cough, sputum production and  shortness of breath.   Cardiovascular: Positive for chest pain. Negative for palpitations, leg  swelling and near-syncope.  Gastrointestinal: Negative for abdominal pain and vomiting.  Genitourinary: Negative for dysuria and hematuria.  Musculoskeletal: Negative for arthralgias and back pain.  Skin: Negative for color change and rash.  Neurological: Negative for seizures, syncope and numbness.  All other systems reviewed and are negative.    Physical Exam Updated Vital Signs BP 106/65   Pulse 92   Temp 98.2 F (36.8 C) (Oral)   Resp 15   LMP 11/11/2011   SpO2 97%   Physical Exam  Constitutional: She appears well-developed and well-nourished. No distress.  HENT:  Head: Normocephalic and atraumatic.  Eyes: Conjunctivae are normal.  Neck: Neck supple.  Cardiovascular: Normal rate, regular rhythm, intact distal pulses and normal pulses.  No murmur heard. Pulmonary/Chest: Effort normal and breath sounds normal. No respiratory distress. She has no decreased breath sounds. She has no wheezes.  On 3L (baseline O2)  Abdominal: Soft. There is no tenderness.  Musculoskeletal: Normal range of motion. She exhibits no edema.       Left lower leg: She exhibits no tenderness.  Neurological: She is alert.  Skin: Skin is warm and dry.  Psychiatric: She has a normal mood and affect.  Nursing note and vitals reviewed.    ED Treatments / Results  Labs (all labs ordered are listed, but only abnormal results are displayed) Labs Reviewed  BASIC METABOLIC PANEL - Abnormal; Notable for the following components:      Result Value   Sodium 131 (*)    Chloride 82 (*)    Glucose, Bld 258 (*)    BUN 43 (*)    Creatinine, Ser 1.55 (*)    GFR calc non Af Amer 39 (*)    GFR calc Af Amer 45 (*)    Anion gap 18 (*)    All other components within normal limits  CBC - Abnormal; Notable for the following components:   WBC 16.3 (*)    RDW 16.6 (*)    All other components within normal limits    BRAIN NATRIURETIC PEPTIDE  D-DIMER, QUANTITATIVE (NOT AT [facility])  I-STAT TROPONIN, ED  I-STAT BETA HCG BLOOD, ED ([facility], [facility], [facility] ONLY)  I-STAT TROPONIN, ED    EKG EKG Interpretation  Date/Time:  Thursday October 19 2017 21:53:42 EDT Ventricular Rate:  91 PR Interval:    QRS Duration: 124 QT Interval:  425 QTC Calculation: 523 R Axis:   -54 Text Interpretation:  Sinus rhythm Probable left atrial enlargement RBBB and LAFB Baseline wander in lead(s) V1 V2 No significant change since last tracing Confirmed by Blanchie Dessert [phone]) on 10/19/2017 10:10:19 PM   Radiology Dg Chest 2 View  Result Date: 10/19/2017 CLINICAL DATA:  Initial evaluation for acute chest pain, shortness of breath, productive cough. EXAM: CHEST - 2 VIEW COMPARISON:  Prior radiograph from 07/19/2017. FINDINGS: Mild cardiomegaly, stable from previous. Mediastinal silhouette within normal limits. Lungs mildly hypoinflated with elevation of the right hemidiaphragm, stable. Scattered chronic bronchitic changes, likely related history of COPD. Mild perihilar vascular congestion without pulmonary edema. No consolidative airspace opacity. No pleural effusion. No pneumothorax. No acute osseous abnormality. IMPRESSION: 1. Scattered bronchitic changes, likely related to history of COPD. Appearance is similar to previous. 2. Mild cardiomegaly with perihilar vascular congestion without overt pulmonary edema. Electronically Signed   By: Jeannine Boga M.D.   On: 10/19/2017 22:26    Procedures Procedures (including critical care time)  Medications Ordered in ED Medications  nitroGLYCERIN (NITROSTAT) SL tablet 0.4 mg (has no administration  in time range)     Initial Impression / Assessment and Plan / ED Course  I have reviewed the triage vital signs and the nursing notes.  Pertinent labs & imaging results that were available during my care of the patient were reviewed by me and considered in my medical decision making (see  chart for details).    [name] is a 47 year old female with history of high cholesterol, COPD, history of prior DVTs, diabetes, heart failure who presents to the ED with chest pain.  Patient with overall normal vitals.  No fever.  Patient with chest pain that started about 3 hours prior to arrival.  She states that she took her home inhalers without any relief, took aspirin without any relief, took nitroglycerin without any relief.  Patient states that pain is worse when she moves her left upper arm or if she pushes on her chest.  Patient states that she has been using 3 pound dumbbells for some home exercise over the past several weeks.  She denies any trauma.  Patient denies any infectious symptoms.  Patient denies any exertional chest pain, nausea, radiation of pain.  Patient EKG performed shows sinus rhythm with no signs of ischemic changes.  EKG is unchanged from prior.  Patient had stress test about 2 years ago that was within normal limits.  Will evaluate with troponin.  Patient has prior history of DVT but otherwise no new hypoxia, shortness of breath.  Patient on her home 3 L of oxygen.  Will get d-dimer to evaluate for PE.  Chest x-ray, basic labs obtained to evaluate for electrolyte abnormalities, anemia, infection.  Patient already took aspirin and will try additional dose of nitroglycerin.  Patient possibly with costochondritis given history but does have multiple cardiac risk factors.  Patient with no significant electrolyte abnormalities.  Glucose elevated but at baseline.  Patient with elevated creatinine but also at baseline.  Anion gap mildly elevated likely secondary to CKD.  Patient with no fever, no white count and doubt infectious process.  Patient had chest x-ray that showed no signs of pneumonia, pneumothorax, pleural effusion.  Patient with normal BNP and no signs of volume overload on exam and doubt heart failure.  D-dimer within normal limits and doubt PE.  Troponins negative.   Discussed with patient at length about possible need for admission for further ACS rule out but patient would like to be discharged home with follow-up outpatient. Second troponin ordered early as patient would like to leave, however, troponin is drawn about 6 hours after time pain started and was within normal limits. She understands the risks and benefits of staying versus leaving but giving history and physical exam, low concern for ACS at this time and gave patient strict return precautions.  Shared decision-making was used and patient was discharged in the ED good condition and told to follow-up with cardiology.  Patient understands return precautions.  Final Clinical Impressions(s) / ED Diagnoses   Final diagnoses:  Chest wall pain  Chest pain, unspecified type    ED Discharge Orders    None       Lennice Sites, DO 10/19/17 2358    Blanchie Dessert, MD 10/20/17 2324

## 2017-10-19 NOTE — ED Provider Notes (Deleted)
I saw and evaluated the patient, reviewed the resident's note and I agree with the findings and plan.   EKG Interpretation  Date/Time:  Thursday October 19 2017 21:53:42 EDT Ventricular Rate:  91 PR Interval:    QRS Duration: 124 QT Interval:  425 QTC Calculation: 523 R Axis:   -54 Text Interpretation:  Sinus rhythm Probable left atrial enlargement RBBB and LAFB Baseline wander in lead(s) V1 V2 No significant change since last tracing Confirmed by Blanchie Dessert [phone]) on 10/19/2017 10:10:19 PM      Patient is a 47 year old female with multiple medical problems including COPD, chronic oxygen use, ongoing tobacco abuse and CHF who is presenting with an atypical chest pain today.  Patient states the pain started at rest and feels worse when she touches it and when she moves her left arm.  The pain is in the left side of her chest but does not seem to be worse with coughing.  Patient states she gets this pain usually weekly but it always goes away with 2 aspirin which did not resolve today.  Patient denies any new cough or congestion.  She has had no abdominal pain and symptoms did not start after eating.  On exam pain is reproduced by palpating the left side of the chest and moving the arm.  She has no evidence of rash or zoster.  Breath sounds are clear bilaterally, heart regular rate and rhythm.  Patient was given nitroglycerin by EMS without improvement of her symptoms.  Patient has no abdominal pain on exam or distal edema concerning for CHF.  Patient's labs are reassuring with a normal troponin, d-dimer and unchanged renal function.  Patient does have a leukocytosis of 16,000 but she has a chronic leukocytosis every time she is in the emergency room, BNP within normal limits, chest x-ray without acute findings.  Patient's symptoms seem more musculoskeletal in nature.  She did have a catheterization 4 years ago that showed some disease in her LAD but nothing requiring intervention at that time.  2  years ago she had a negative stress test.  Again low suspicion the patient's symptoms are cardiac.  We will do a delta troponin and if negative feel that patient is reasonable for discharge home and follow-up as an outpatient with cardiology.   Blanchie Dessert, MD 10/20/17 [phone]

## 2017-10-19 NOTE — ED Triage Notes (Addendum)
From EMS for chest pain that began 30 min prior.  2 FULL ASA taken prior to arrival.  Sharp stabbing pain to central chest. A&Ox4 at this time, vitals stable for EMS.

## 2017-10-19 NOTE — ED Triage Notes (Signed)
EMS gave 2 nitro with minimal relief.  5mg  albuterol also given by EMS  EMS Vitals: 104/60 HR92 97% on room air

## 2017-10-20 NOTE — ED Notes (Signed)
Patient Alert and oriented to baseline. Stable and ambulatory to baseline. Patient verbalized understanding of the discharge instructions.  Patient belongings were taken by the patient.   

## 2017-10-24 ENCOUNTER — Other Ambulatory Visit: Payer: Self-pay | Admitting: Primary Care

## 2017-10-24 DIAGNOSIS — G629 Polyneuropathy, unspecified: Secondary | ICD-10-CM

## 2017-10-25 NOTE — Telephone Encounter (Signed)
Ok to refill? Electronically refill request for pregabalin (LYRICA) 300 MG capsule.  Last prescribed on 07/21/2017. Last seen on 08/02/2017

## 2017-10-25 NOTE — Telephone Encounter (Signed)
Refill sent to pharmacy.   

## 2017-11-03 ENCOUNTER — Other Ambulatory Visit: Payer: Self-pay | Admitting: Internal Medicine

## 2017-11-23 ENCOUNTER — Other Ambulatory Visit: Payer: Self-pay

## 2017-11-23 DIAGNOSIS — J449 Chronic obstructive pulmonary disease, unspecified: Secondary | ICD-10-CM

## 2017-11-23 MED ORDER — ALBUTEROL SULFATE HFA 108 (90 BASE) MCG/ACT IN AERS: INHALATION_SPRAY | RESPIRATORY_TRACT | 0 refills | Status: DC

## 2017-11-23 NOTE — Telephone Encounter (Signed)
Patient following with pulmonology. Will refill albuterol.

## 2017-11-23 NOTE — Telephone Encounter (Signed)
Received a refill request for an albuterol inhaler. She was seen in January for Bronchitis. The last Proventil inhaler was sent in 2017.

## 2017-11-24 IMAGING — CT CT HEAD W/O CM
4 series · 15 of 47 positions shown, 17 images · non-contrast
Comparison: None.

CLINICAL DATA: Episode of transient blindness with blurred vision

EXAM:
CT HEAD WITHOUT CONTRAST
TECHNIQUE: Contiguous axial images were obtained from the base of the skull
through the vertex without intravenous contrast.

[Series 3: head wo · axial · 0.42mm/px · z∈[-113,+7]mm · 7 of 32 slices shown, 9 images]
[im 4/32  brain]
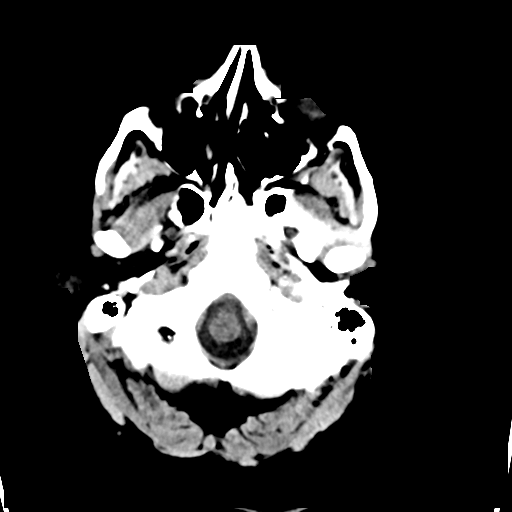
[im 4/32  bone]
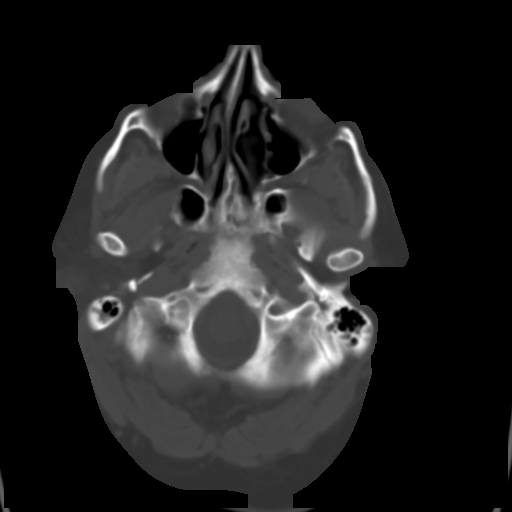
[im 8/32  brain]
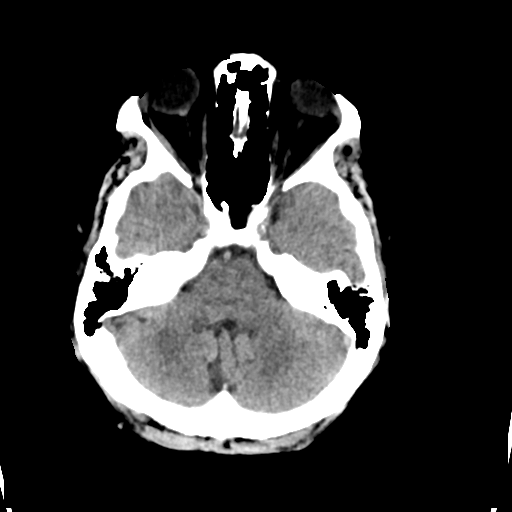
[im 12/32  brain]
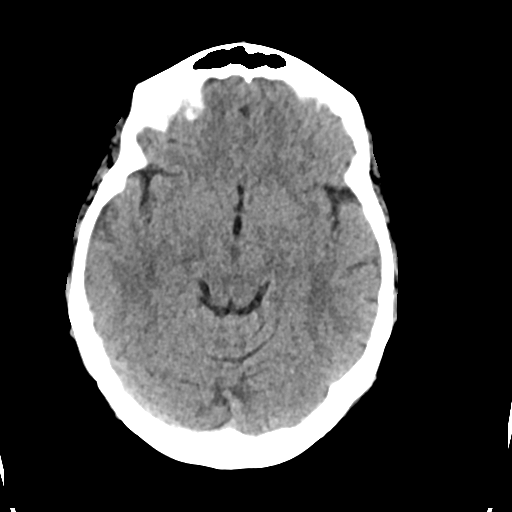
[im 16/32  brain]
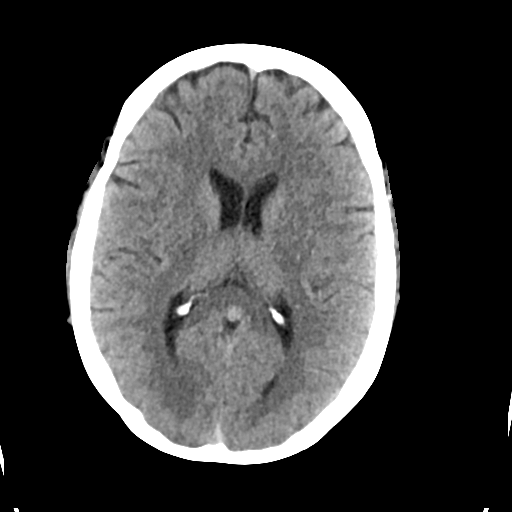
[im 20/32  brain]
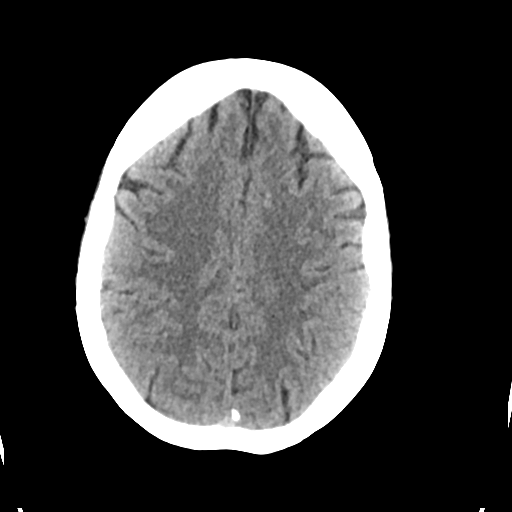
[im 20/32  bone]
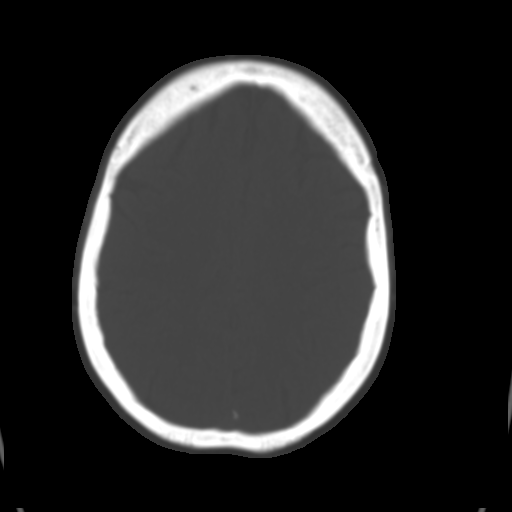
[im 24/32  brain]
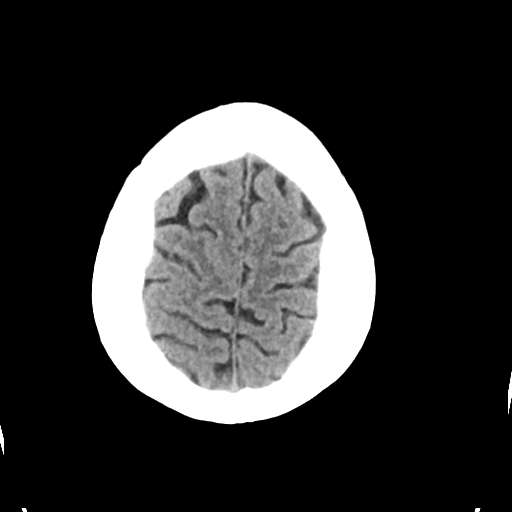
[im 28/32  brain]
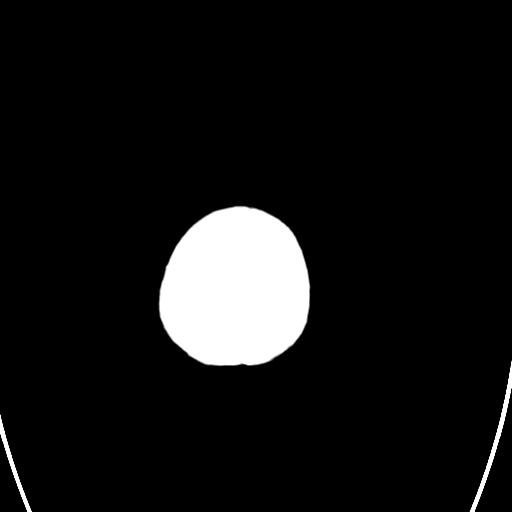

[Series 4: head bone · axial · 0.42mm/px · z∈[-114,-98]mm · 2 of 79 slices shown]
[im 8/79  bone]
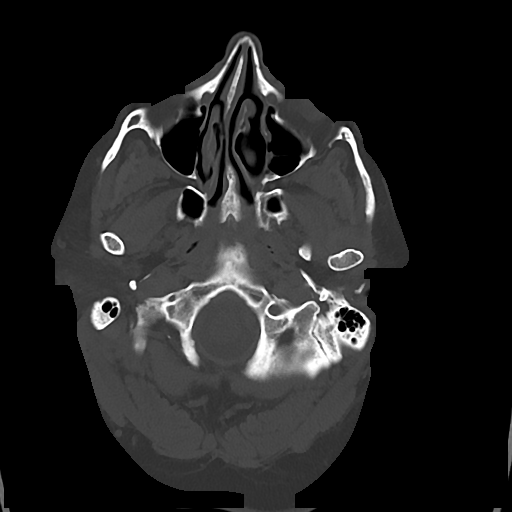
[im 16/79  bone]
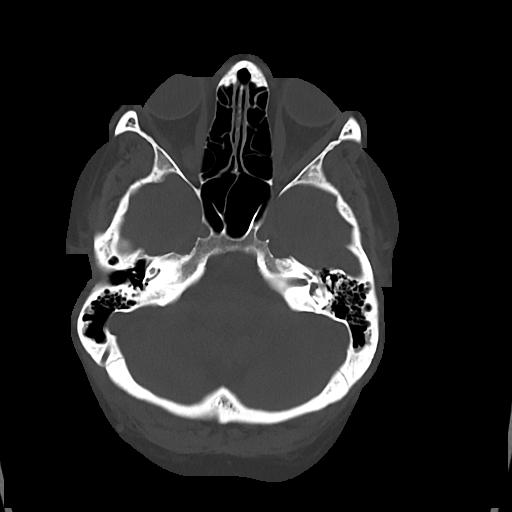

[Series 5: cor soft · coronal · 0.31mm/px · 3 of 67 slices shown]
[im 23/67  brain]
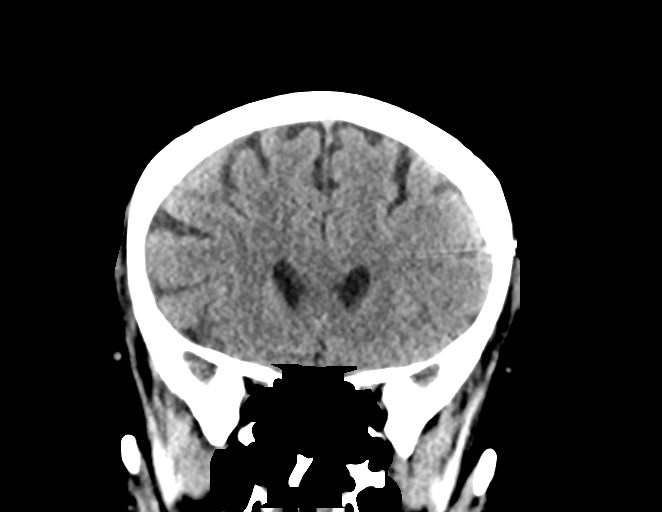
[im 30/67  brain]
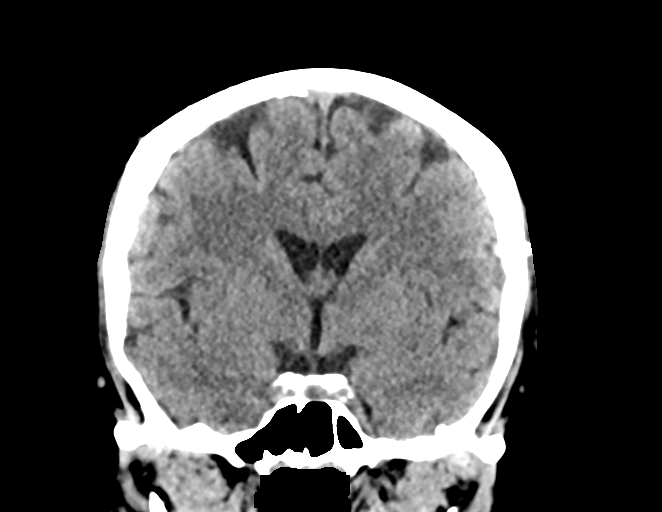
[im 37/67  brain]
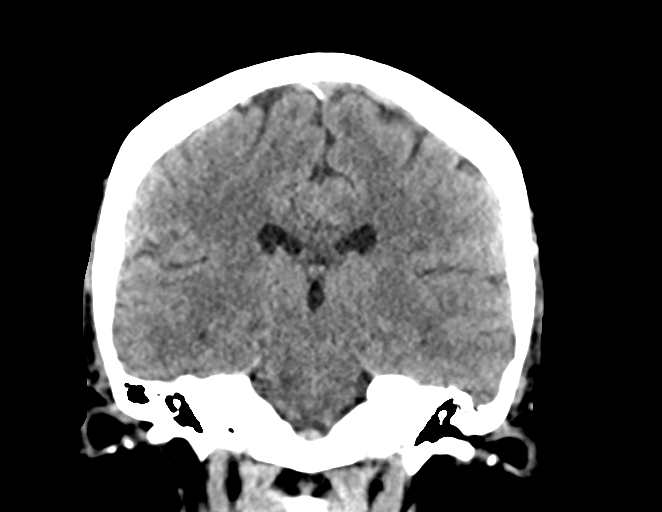

[Series 6: sag soft · sagittal · 0.31mm/px · 3 of 55 slices shown]
[im 19/55  brain]
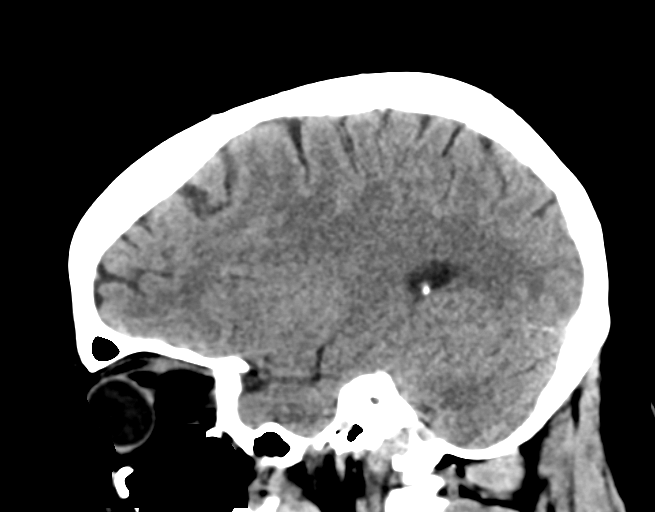
[im 28/55  brain]
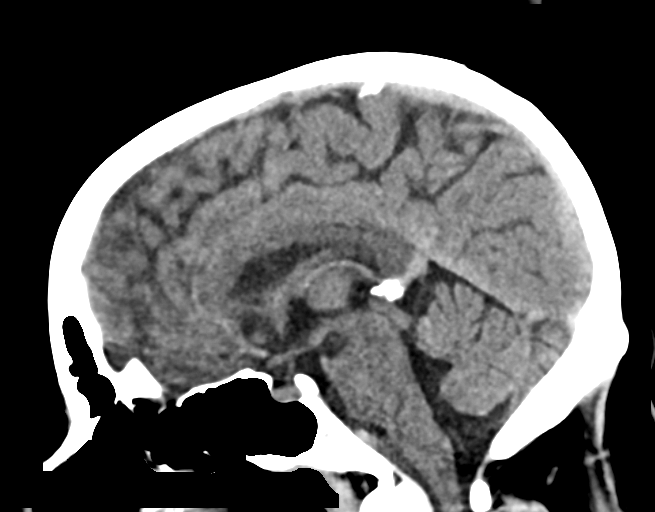
[im 37/55  brain]
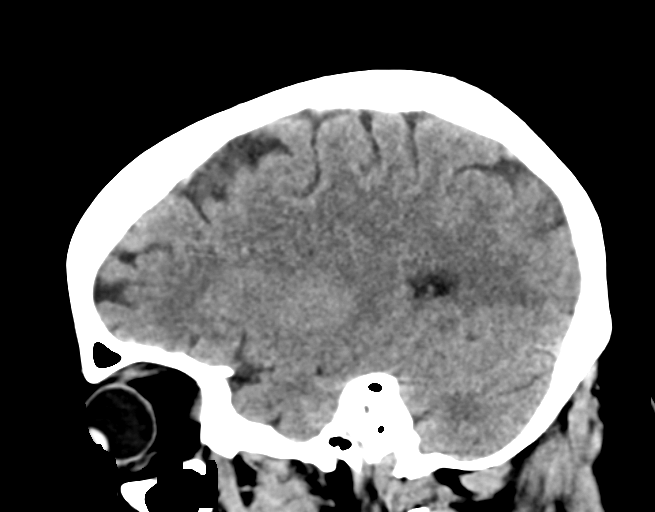

[15 of 47 positions shown; findings below may reference images not displayed]

FINDINGS: Brain: The ventricles are normal in size and configuration. There is
no intracranial mass, hemorrhage, extra-axial fluid collection, or
midline shift. Gray-white compartments appear normal. No evident
acute infarct.

Vascular: No hyperdense vessel. There are foci of calcification in
each carotid siphon.

Skull: The bony calvarium appears intact. There is a metallic
foreign body in the scout immediately adjacent to the left frontal
bone.

Sinuses/Orbits: There is slight mucosal thickening in several
ethmoid air cells. Other visualized paranasal sinuses are clear.
There is rightward deviation of the nasal septum. Orbits appear
symmetric bilaterally.

Other: Mastoid air cells are clear.
IMPRESSION: No intracranial mass, hemorrhage, or extra-axial fluid collection.
Gray-white compartments are normal.

Rounded metallic foreign body in the scout immediately adjacent to
the left frontal bone midportion.

Age advanced atherosclerotic calcification in the carotid siphon
regions bilaterally.

Mild paranasal sinus disease in the ethmoid regions. Rightward
deviation of nasal septum.

## 2017-11-28 IMAGING — DX DG CHEST 2V
2 series · 2 of 2 positions shown · non-contrast
Comparison: Portable chest x-ray of 01/15/2017

CLINICAL DATA: Loss of vision over night, history of diabetes

EXAM:
CHEST  2 VIEW

[w chest pa]
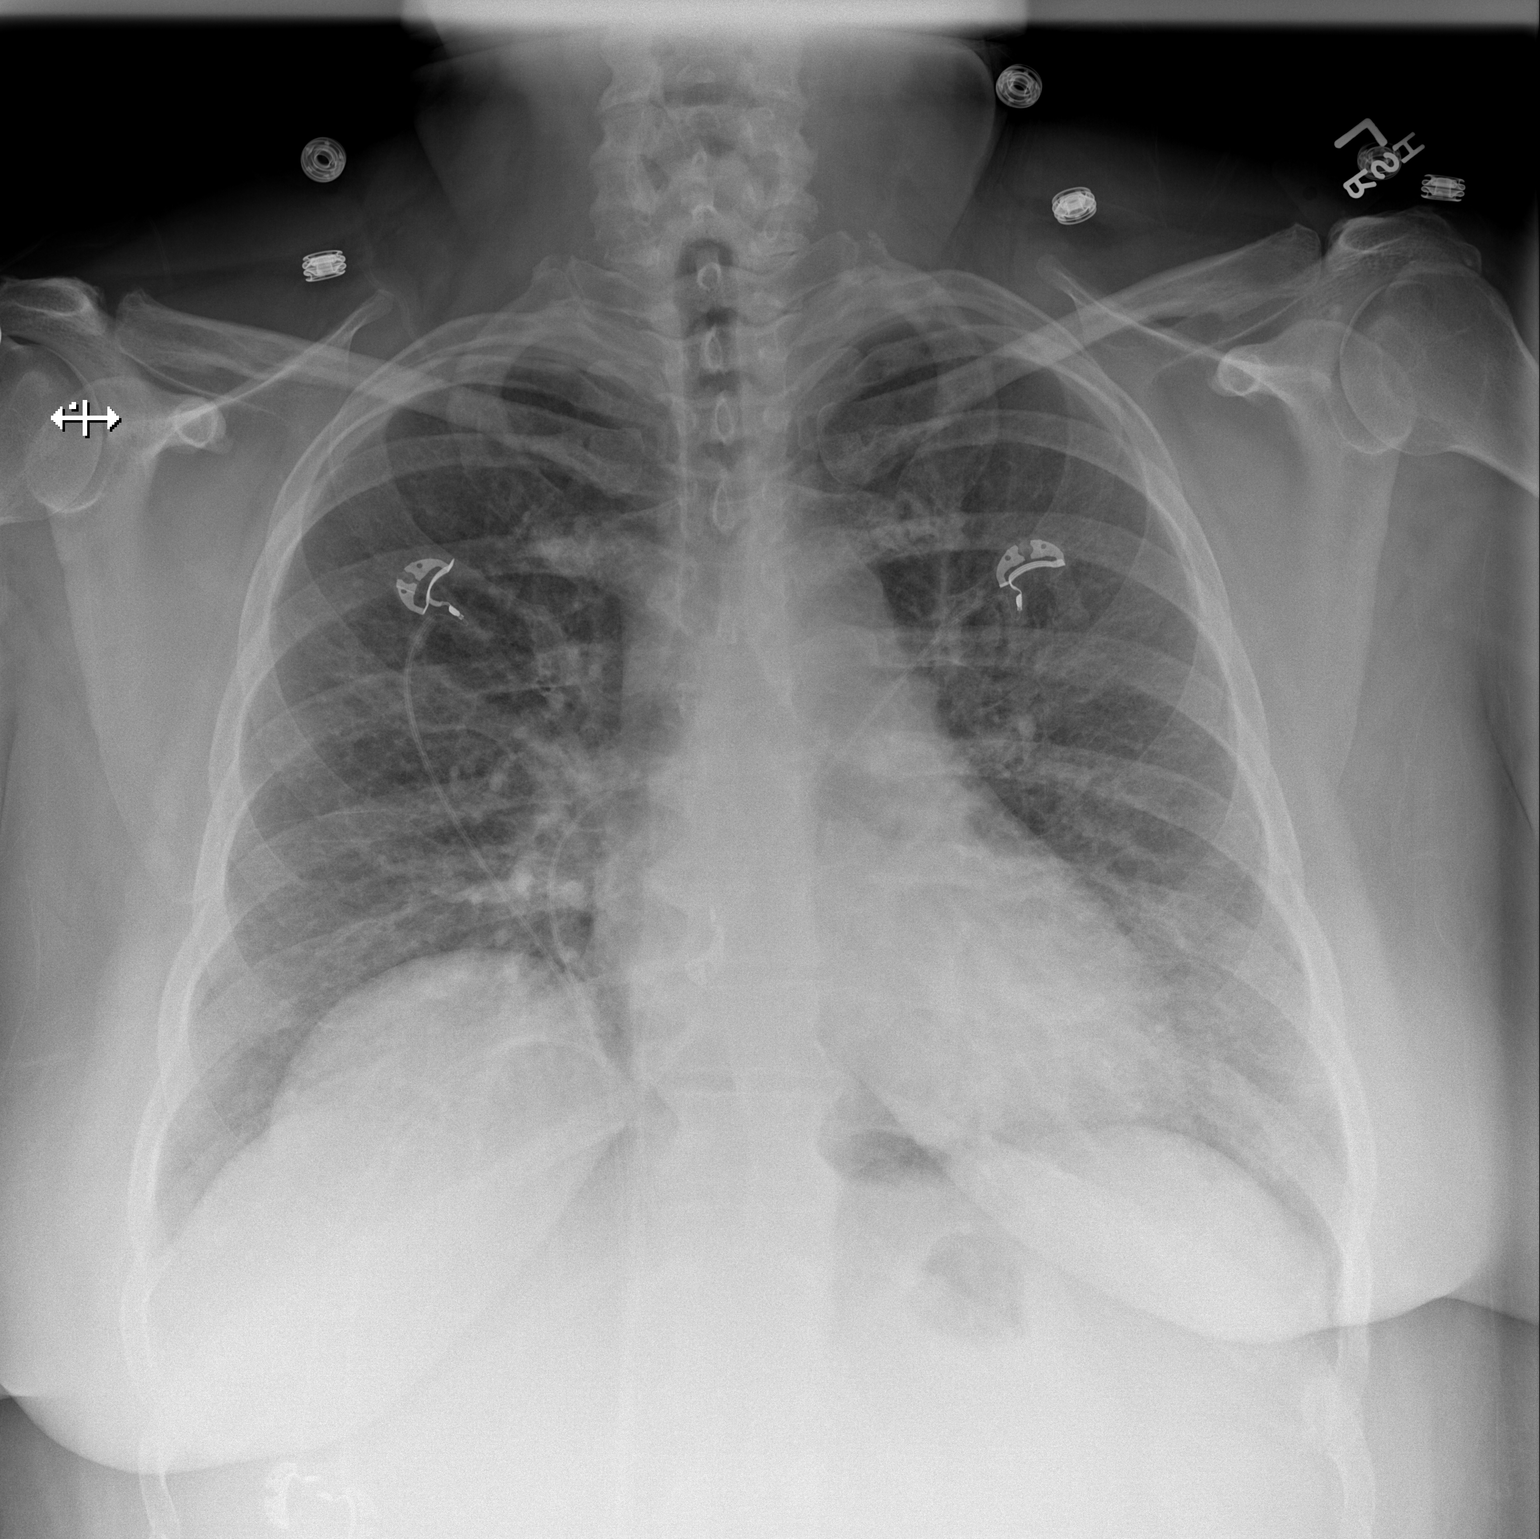

[w chest lat]
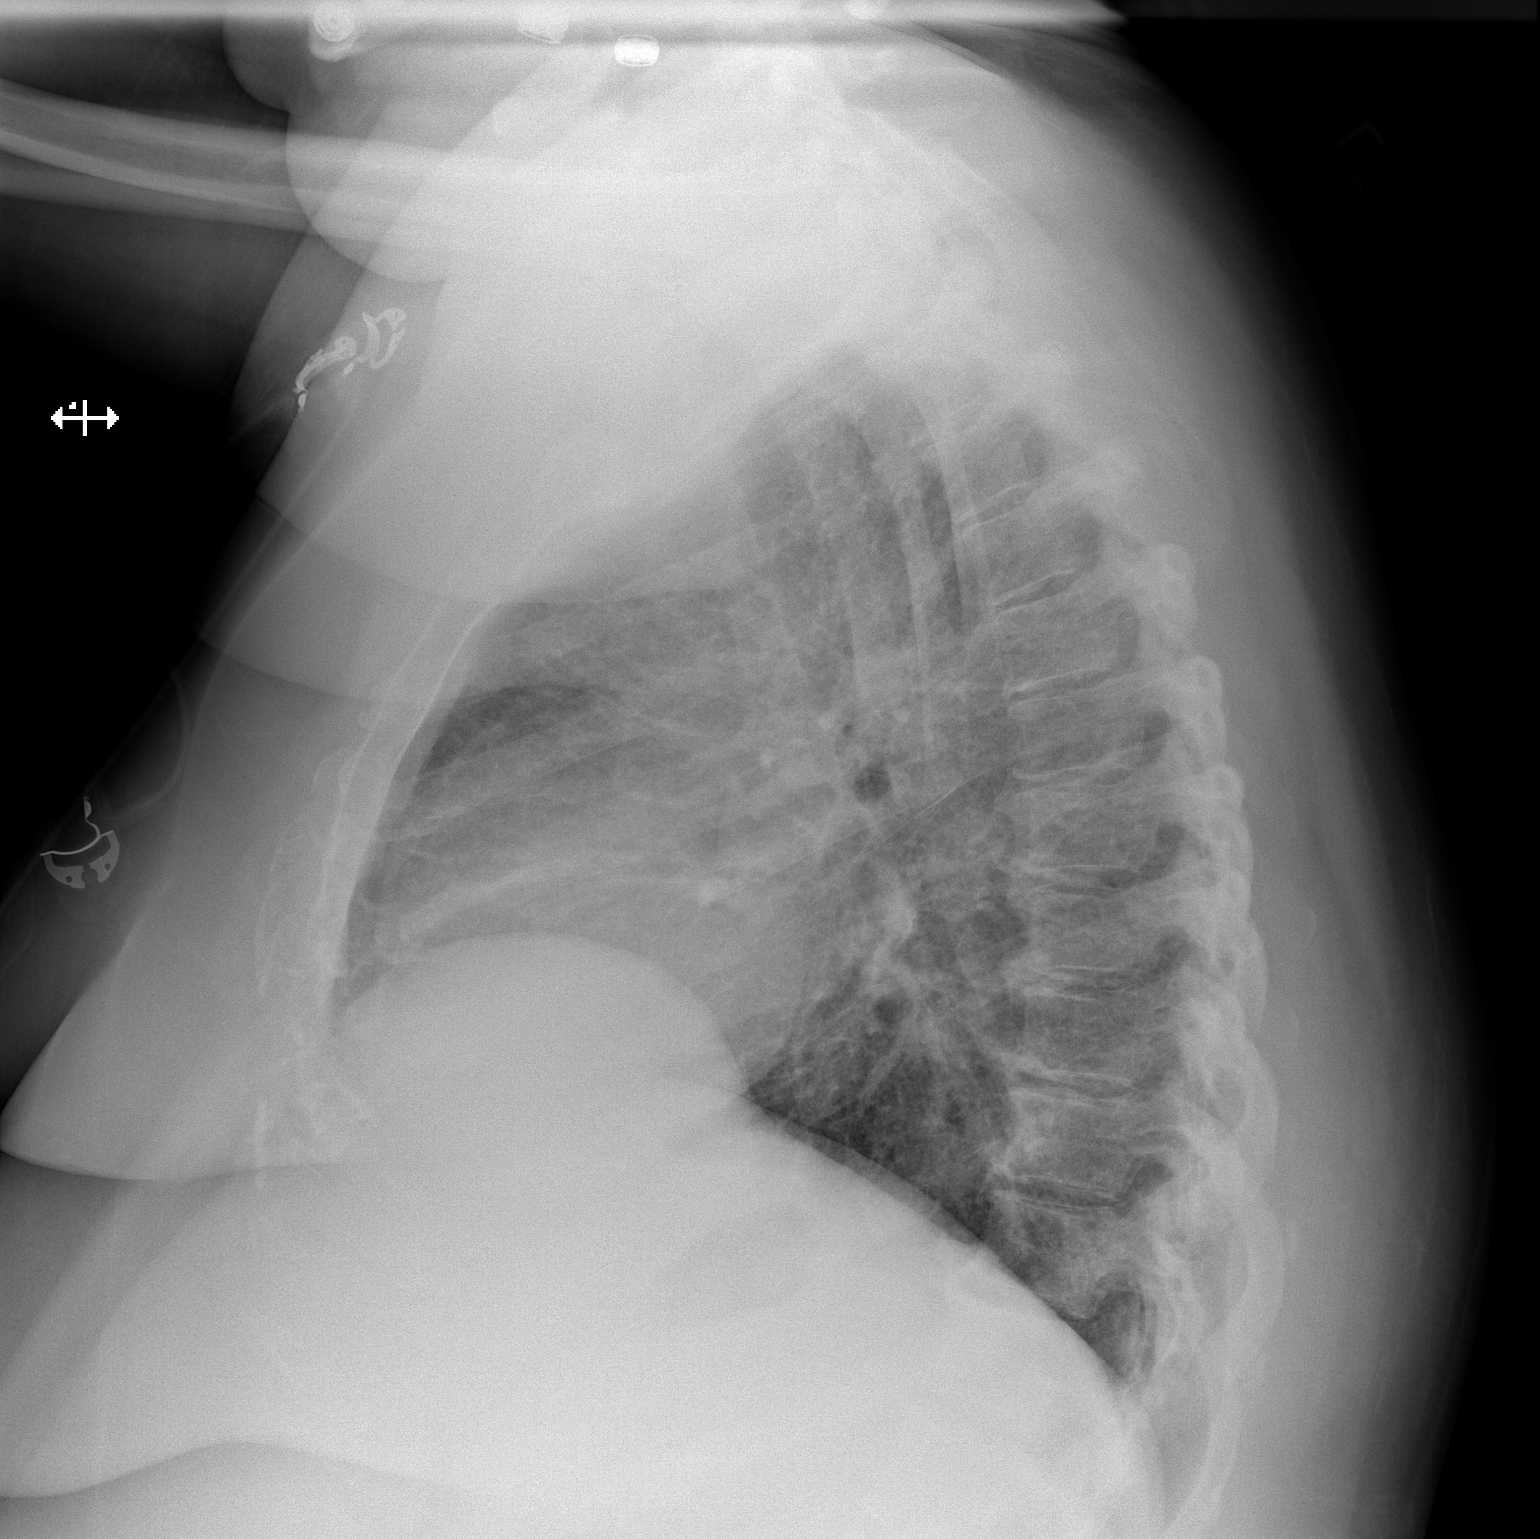

[2 of 2 positions shown; findings below may reference images not displayed]

FINDINGS: No pneumonia or effusion is seen. The lungs are not optimally
aerated. Minimal linear atelectasis or scarring is noted in the
right middle lobe or lingula. Mediastinal and hilar contours are
unremarkable. The heart is borderline enlarged. There are
degenerative changes throughout the mid to lower thoracic spine.
IMPRESSION: Suboptimal inspiration but no active lung disease. Minimal linear
scarring or atelectasis in the right middle lobe or lingula.
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
## 2017-11-29 DIAGNOSIS — I10 Essential (primary) hypertension: Secondary | ICD-10-CM | POA: Diagnosis not present

## 2017-11-29 DIAGNOSIS — F17200 Nicotine dependence, unspecified, uncomplicated: Secondary | ICD-10-CM | POA: Diagnosis not present

## 2017-11-29 DIAGNOSIS — I5030 Unspecified diastolic (congestive) heart failure: Secondary | ICD-10-CM | POA: Diagnosis not present

## 2017-12-05 ENCOUNTER — Telehealth: Payer: Self-pay

## 2017-12-05 NOTE — Telephone Encounter (Signed)
I called and made patient a f/u for tomorrow based on patient's

## 2017-12-06 ENCOUNTER — Ambulatory Visit (INDEPENDENT_AMBULATORY_CARE_PROVIDER_SITE_OTHER): Payer: BLUE CROSS/BLUE SHIELD | Admitting: Endocrinology

## 2017-12-06 ENCOUNTER — Encounter: Payer: Self-pay | Admitting: Endocrinology

## 2017-12-06 VITALS — BP 90/68 | HR 98 | Wt 288.2 lb

## 2017-12-06 DIAGNOSIS — Z794 Long term (current) use of insulin: Secondary | ICD-10-CM

## 2017-12-06 DIAGNOSIS — N183 Chronic kidney disease, stage 3 (moderate): Secondary | ICD-10-CM

## 2017-12-06 DIAGNOSIS — E1122 Type 2 diabetes mellitus with diabetic chronic kidney disease: Secondary | ICD-10-CM

## 2017-12-06 DIAGNOSIS — R0989 Other specified symptoms and signs involving the circulatory and respiratory systems: Secondary | ICD-10-CM | POA: Diagnosis not present

## 2017-12-06 DIAGNOSIS — I10 Essential (primary) hypertension: Secondary | ICD-10-CM | POA: Diagnosis not present

## 2017-12-06 LAB — POCT GLYCOSYLATED HEMOGLOBIN (HGB A1C): Hemoglobin A1C: 11.6 % — AB (ref 4.0–5.6)

## 2017-12-06 MED ORDER — SEMAGLUTIDE(0.25 OR 0.5MG/DOS) 2 MG/1.5ML ~~LOC~~ SOPN: 0.5000 mg | PEN_INJECTOR | SUBCUTANEOUS | 11 refills | Status: DC

## 2017-12-06 NOTE — Progress Notes (Signed)
Subjective:    Patient ID: [name], female    DOB: 01-28-1971, 47 y.o.   MRN: [id number]  HPI Pt returns for f/u of diabetes mellitus: DM type: Insulin-requiring type 2 Dx'ed: 1988.  Complications: polyneuropathy, TIA, renal insuff, and DR Therapy: insulin since soon after dx (also takes victoza, metformin, and farxiga) GDM: never DKA: never Severe hypoglycemia: last episode was mid-2018 Pancreatitis: never Pancreatic imaging: normal on 2011 CT Other: she takes multiple daily injections; she takes U-500, due to severe insulin resistance.  She does not eat lunch, so she does not take insulin then Interval history:  she brings a record of her cbg's which I have reviewed today.  It varies from 238-400's.  pt states she feels well in general, except for fatigue. She does not take victoza.   Past Medical History:  Diagnosis Date  . Anxiety    takes Prozac daily  . Anxiety   . Aortic valve calcification   . Asthma    Advair and Spirva daily  . Asthma   . Bipolar disorder ([place])   . CAD (coronary artery disease)    a. LHC 11/2013 done for CP/fluid retention: mild disease in prox LAD, mild-mod disease in mRCA, EF 60% with normal LVEDP. b. Normal nuc 03/2016.  Marland Kitchen CHF (congestive heart failure) ([place])   . Chronic diastolic CHF (congestive heart failure) ([place])   . CKD (chronic kidney disease), stage II   . COPD (chronic obstructive pulmonary disease) ([facility])    a. nocturnal O2.  Marland Kitchen COPD (chronic obstructive pulmonary disease) ([place])   . Coronary artery disease   . Diabetes mellitus   . Diabetes mellitus without complication ([place])   . Dyspnea   . Family history of adverse reaction to anesthesia    mom gets nauseated  . GERD (gastroesophageal reflux disease)    takes Pepcid daily  . History of blood clots    left leg 3-44yrs ago  . Hyperlipidemia   . Hypertension   . Hypertriglyceridemia   . Inguinal hernia, left 01/2015  . Muscle spasm   . Peripheral neuropathy   . RBBB   .  Seizures ([place])   . Sinus tachycardia    a. persistent since 2009.  Marland Kitchen Smokers' cough ([place])   . Stroke [facility]) 1989   left sided weakness  . TIA (transient ischemic attack)   . Tobacco abuse     Past Surgical History:  Procedure Laterality Date  . HERNIA REPAIR    . INGUINAL HERNIA REPAIR Left 04/08/2015   Procedure: OPEN LEFT INGUINAL HERNIA REPAIR WITH MESH;  Surgeon: Ralene Ok, MD;  Location: [facility];  Service: General;  Laterality: Left;  . INSERTION OF MESH Left 04/08/2015   Procedure: INSERTION OF MESH;  Surgeon: Ralene Ok, MD;  Location: [facility];  Service: General;  Laterality: Left;  . LAPAROSCOPY     Endometriosis  . LEFT HEART CATHETERIZATION WITH CORONARY ANGIOGRAM N/A 12/05/2013   Procedure: LEFT HEART CATHETERIZATION WITH CORONARY ANGIOGRAM;  Surgeon: Jettie Booze, MD;  Location: [facility] CATH LAB;  Service: Cardiovascular;  Laterality: N/A;  . right kidney drained    . TEE WITHOUT CARDIOVERSION N/A 11/28/2013   Procedure: TRANSESOPHAGEAL ECHOCARDIOGRAM (TEE);  Surgeon: Thayer Headings, MD;  Location: [facility] ENDOSCOPY;  Service: Cardiovascular;  Laterality: N/A;    Social History   Socioeconomic History  . Marital status: Married    Spouse name: Not on file  . Number of children: Not on file  . Years of education:  Not on file  . Highest education level: Not on file  Occupational History  . Not on file  Social Needs  . Financial resource strain: Not on file  . Food insecurity:    Worry: Not on file    Inability: Not on file  . Transportation needs:    Medical: Not on file    Non-medical: Not on file  Tobacco Use  . Smoking status: Current Some Day Smoker    Packs/day: 0.50    Years: 30.00    Pack years: 15.00    Types: Cigarettes    Last attempt to quit: 01/08/2017    Years since quitting: 0.9  . Smokeless tobacco: Never Used  Substance and Sexual Activity  . Alcohol use: No    Frequency: Never  . Drug use: No  . Sexual activity: Never  Lifestyle  .  Physical activity:    Days per week: Not on file    Minutes per session: Not on file  . Stress: Not on file  Relationships  . Social connections:    Talks on phone: Not on file    Gets together: Not on file    Attends religious service: Not on file    Active member of club or organization: Not on file    Attends meetings of clubs or organizations: Not on file    Relationship status: Not on file  . Intimate partner violence:    Fear of current or ex partner: Not on file    Emotionally abused: Not on file    Physically abused: Not on file    Forced sexual activity: Not on file  Other Topics Concern  . Not on file  Social History Narrative   ** Merged History Encounter **       Lives in [place]   Married but lives with Boyfriend - not legally separated   Disabled - for BiPolar, Seizure disorder, diabetes   Formerly worked at [employer]          Current Outpatient Medications on File Prior to Visit  Medication Sig Dispense Refill  . acetaminophen (TYLENOL) 325 MG tablet Take 2 tablets (650 mg total) by mouth every 4 (four) hours as needed for headache or mild pain. 30 tablet 2  . albuterol (PROVENTIL HFA) 108 (90 Base) MCG/ACT inhaler INHALE TWO (2) PUFFS EVERY FOUR HOURS ASNEEDED FOR SHORTNESS OF BREATH 6.7 g 0  . ALPRAZolam (XANAX) 1 MG tablet Take 1 mg by mouth 2 (two) times daily.     Marland Kitchen aspirin EC 81 MG tablet Take 81 mg by mouth every morning.    . budesonide-formoterol (SYMBICORT) 80-4.5 MCG/ACT inhaler INHALE 2 PUFFS EVERY 12 HOURS TO PREVENTCOUGH OR WHEEZE ** RINSE MOUTH AFTER USING ** 10.2 g 0  . busPIRone (BUSPAR) 30 MG tablet Take 1 tablet (30 mg total) by mouth 2 (two) times daily. 60 tablet 0  . FLUoxetine (PROZAC) 40 MG capsule Take 40 mg by mouth daily.    Marland Kitchen glucose blood (ONE TOUCH ULTRA TEST) test strip Used to check blood sugars 4x daily. 200 each 4  . ibuprofen (ADVIL,MOTRIN) 800 MG tablet Take 1 tablet by mouth every 8 hours as needed for moderate pain. 30 tablet  0  . Insulin Pen Needle 32G X 4 MM MISC Used to give insulin injections twice daily. 100 each 11  . insulin regular human CONCENTRATED (HUMULIN R U-500 KWIKPEN) 500 UNIT/ML kwikpen Inject 400 Units into the skin 2 (two) times daily with a meal. (  Patient taking differently: Inject 175-300 Units into the skin 2 (two) times daily with a meal. ) 16 pen 11  . ipratropium-albuterol (DUONEB) 0.5-2.5 (3) MG/3ML SOLN Take 3 mLs by nebulization every 4 (four) hours as needed (wheezing/shortness of breath). 360 mL 0  . LYRICA 300 MG capsule TAKE 1 CAPSULE BY MOUTH TWICE DAILY FOR NEUROPATHY 180 capsule 0  . metFORMIN (GLUCOPHAGE) 1000 MG tablet Take 1,000 mg by mouth 2 (two) times daily with a meal.    . metolazone (ZAROXOLYN) 2.5 MG tablet Take 2.5 mg by mouth every other day. Take 2.5 mg by mouth every 48 hours on Tuesday, Thursday and Saturday    . nicotine (NICODERM CQ - DOSED IN MG/24 HOURS) 21 mg/24hr patch Place 1 patch (21 mg total) at bedtime onto the skin. 28 patch 1  . ondansetron (ZOFRAN) 4 MG tablet Take 4 mg by mouth every 6 (six) hours as needed for nausea or vomiting.    . OXYGEN Inhale 4 L into the lungs at bedtime.     Marland Kitchen PRESCRIPTION MEDICATION Place 1 drop into both eyes daily as needed (every 14 weeks before injections).    . ranitidine (ZANTAC) 150 MG tablet Take 150 mg by mouth 2 (two) times daily.     Marland Kitchen rOPINIRole (REQUIP) 0.5 MG tablet Take 1 tablet (0.5 mg total) by mouth at bedtime. 90 tablet 0  . spironolactone (ALDACTONE) 50 MG tablet Take 50 mg by mouth daily.    Marland Kitchen torsemide (DEMADEX) 10 MG tablet Take 3 tablets by mouth once daily for swelling and fluid retention. (Patient taking differently: Take 20-40 mg by mouth 2 (two) times daily. 40 mg in the morning and 20 mg in the evening) 90 tablet 0  . traZODone (DESYREL) 50 MG tablet Take 150 mg by mouth at bedtime.     Marland Kitchen linaclotide (LINZESS) 290 MCG CAPS capsule Take 1 capsule (290 mcg total) by mouth daily before breakfast. 90  capsule 0   No current facility-administered medications on file prior to visit.     Allergies  Allergen Reactions  . Metoprolol Shortness Of Breath    Occurrence of shortness of breath after 3 days  . Montelukast Shortness Of Breath  . Morphine Sulfate Shortness Of Breath and Nausea And Vomiting    Swollen Throat - Able to tolerate dilaudid  . Penicillins Hives and Shortness Of Breath    Swelling on throat Has patient had a PCN reaction causing immediate rash, facial/tongue/throat swelling, SOB or lightheadedness with hypotension: Yes Has patient had a PCN reaction causing severe rash involving mucus membranes or skin necrosis: No Has patient had a PCN reaction that required hospitalization Yes Has patient had a PCN reaction occurring within the last 10 years: No If all of the above answers are "NO", then may proceed with Cephalosporin use.   . Prednisone Anaphylaxis  . Diltiazem Swelling  . Gabapentin Swelling    Family History  Problem Relation Age of Onset  . Venous thrombosis Brother   . Other Brother        BRAIN TUMOR  . Asthma Father   . Diabetes Father   . Coronary artery disease Mother   . Hypertension Mother   . Diabetes Mother   . Asthma Sister   . Diabetes type II Brother     BP 90/68   Pulse 98   Wt 288 lb 3.2 oz (130.7 kg)   LMP 11/11/2011   SpO2 97%   BMI 46.52 kg/m  Review of Systems She denies hypoglycemia.  Dizziness is improved.      Objective:   Physical Exam VITAL SIGNS:  See vs page GENERAL: no distress Pulses: dorsalis pedis intact bilat.   MSK: no deformity of the feet CV: 1+ bilat leg edema Skin:  no ulcer on the feet, but the skin is dry.  normal color and temp on the feet. Neuro: sensation is intact to touch on the feet, but decreased from normal Ext: There is bilateral onychomycosis of the toenails.     Lab Results  Component Value Date   HGBA1C 11.6 (A) 12/06/2017      Assessment & Plan:  Insulin-requiring type 2 DM,  with renal insuff: worse Inaccurate med dosing: I advised pt to increase to the full rx'ed dosage.   Patient Instructions  check your blood sugar twice a day.  vary the time of day when you check, between before the 3 meals, and at bedtime.  also check if you have symptoms of your blood sugar being too high or too low.  please keep a record of the readings and bring it to your next appointment here (or you can bring the meter itself).  You can write it on any piece of paper.  please call us sooner if your blood sugar goes below 70, or if you have a lot of readings over 200. For now, please:  Increase the insulin to 400 units with breakfast and 400 units with supper, no matter what you blood sugar is, and:  I have sent a prescription to your pharmacy, to add "Ozempic."  Please call or message [place] next week, to tell us how the blood sugar is doing.  We can increase the ozempic if necessary. Please come back for a follow-up appointment in 2 months.

## 2017-12-06 NOTE — Patient Instructions (Addendum)
check your blood sugar twice a day.  vary the time of day when you check, between before the 3 meals, and at bedtime.  also check if you have symptoms of your blood sugar being too high or too low.  please keep a record of the readings and bring it to your next appointment here (or you can bring the meter itself).  You can write it on any piece of paper.  please call us sooner if your blood sugar goes below 70, or if you have a lot of readings over 200. For now, please:  Increase the insulin to 400 units with breakfast and 400 units with supper, no matter what you blood sugar is, and:  I have sent a prescription to your pharmacy, to add "Ozempic."  Please call or message [place] next week, to tell us how the blood sugar is doing.  We can increase the ozempic if necessary. Please come back for a follow-up appointment in 2 months.

## 2017-12-12 ENCOUNTER — Ambulatory Visit: Payer: BLUE CROSS/BLUE SHIELD | Admitting: Primary Care

## 2017-12-13 ENCOUNTER — Ambulatory Visit (INDEPENDENT_AMBULATORY_CARE_PROVIDER_SITE_OTHER): Payer: BLUE CROSS/BLUE SHIELD | Admitting: Primary Care

## 2017-12-13 ENCOUNTER — Encounter: Payer: Self-pay | Admitting: Primary Care

## 2017-12-13 VITALS — BP 124/72 | HR 94 | Temp 98.0°F | Ht 66.0 in | Wt 286.8 lb

## 2017-12-13 DIAGNOSIS — J9611 Chronic respiratory failure with hypoxia: Secondary | ICD-10-CM

## 2017-12-13 DIAGNOSIS — N183 Chronic kidney disease, stage 3 (moderate): Secondary | ICD-10-CM | POA: Diagnosis not present

## 2017-12-13 DIAGNOSIS — G4734 Idiopathic sleep related nonobstructive alveolar hypoventilation: Secondary | ICD-10-CM

## 2017-12-13 DIAGNOSIS — J449 Chronic obstructive pulmonary disease, unspecified: Secondary | ICD-10-CM

## 2017-12-13 DIAGNOSIS — E1142 Type 2 diabetes mellitus with diabetic polyneuropathy: Secondary | ICD-10-CM | POA: Diagnosis not present

## 2017-12-13 DIAGNOSIS — Z794 Long term (current) use of insulin: Secondary | ICD-10-CM | POA: Diagnosis not present

## 2017-12-13 DIAGNOSIS — I10 Essential (primary) hypertension: Secondary | ICD-10-CM | POA: Diagnosis not present

## 2017-12-13 DIAGNOSIS — F319 Bipolar disorder, unspecified: Secondary | ICD-10-CM

## 2017-12-13 DIAGNOSIS — F17210 Nicotine dependence, cigarettes, uncomplicated: Secondary | ICD-10-CM

## 2017-12-13 DIAGNOSIS — E1122 Type 2 diabetes mellitus with diabetic chronic kidney disease: Secondary | ICD-10-CM | POA: Diagnosis not present

## 2017-12-13 NOTE — Assessment & Plan Note (Signed)
Endorses compliance to oxygen HS and when napping during the day. Resting oxygen saturation today is stable. Referral placed to pulmonology for re-evaluation given recent history of desaturation at rest.   Commended her on smoking cessation.   Exam today without wheezing or rhonchi.

## 2017-12-13 NOTE — Assessment & Plan Note (Signed)
Recently quit smoking, commended her on this success.

## 2017-12-13 NOTE — Assessment & Plan Note (Signed)
Compliant to home oxygen HS. Referral placed to pulmonology for re-evaluation.

## 2017-12-13 NOTE — Assessment & Plan Note (Signed)
Stable in the office today, continue current regimen. 

## 2017-12-13 NOTE — Patient Instructions (Addendum)
Stop by the front desk and speak with either Rosaria Ferries or Anastasiya regarding your referral to the pain and lung doctors.  Continue to monitor your weights daily and report a gain of 2 pounds in 24 hours and 5 pounds in one week.  Continue to monitor your blood sugars, follow up with Dr. Loanne Drilling.  Please watch the sore on your toe and notify me if you notice increased redness, pain, swelling. Make sure to wear shoes in your house as discussed.   I'll be in touch regarding your portable oxygen.  Schedule a 30 minute office visit in 6 months for re-evaluation.  It was a pleasure to see you today!

## 2017-12-13 NOTE — Assessment & Plan Note (Signed)
Increased symptoms, now to upper extremities as well as lower extremities. She is on max dose of Lyrica, has failed gabapentin, cannot take NSAID's, Tylenol is ineffective.   Referral placed to pain management for further evaluation.

## 2017-12-13 NOTE — Progress Notes (Deleted)
   The patient is a 47 year old female with severe COPD, oxygen dependent, she was previously seeing Dr. Melvyn Novas.  She has severe debility/deconditioning, she was maintained on Symbicort, referred to pulmonary rehab. She states her edema is well controlled with current dosing of Demedex.  Test Results: CXR 03/29/2017>>FINDINGS:  Mild cardiomegaly with mild pulmonary venous congestion. Mild bilateral interstitial prominence noted. This is unchanged prior exams and consistent chronic interstitial lung disease. No acute pulmonary abnormality . No pleural effusion or pneumothorax. No acute bony abnormality. CXR : 03/17/2017>>Cardiomegaly with mild pulmonary vascular congestion. Echo 03/15/2017>> EF 65-70% 08/22/2016>> Spirometry shows moderate restriction.

## 2017-12-13 NOTE — Assessment & Plan Note (Signed)
Following with endocrinology, compliant to regimen. Glucose levels seem uncontrolled, yet has only had one injection of Ozempic.   Discussed to continue monitoring glucose readings, follow up with endocrinology as scheduled.

## 2017-12-13 NOTE — Assessment & Plan Note (Signed)
Continue Symbicort daily, atrovent and albuterol PRN.  Referral placed for re-evaluation of COPD, especially given recent history of desaturation. Room air saturation stable today at rest.

## 2017-12-13 NOTE — Progress Notes (Signed)
Subjective:    Patient ID: [name], female    DOB: April 20, 1971, 47 y.o.   MRN: [id number]  HPI  [name] is a 47 year old female who presents today for follow up.  1) Type 2 Diabetes: Currently following with endocrinology and is up to 400 units of Humulin R U-500 insulin BID, Ozempic was added during her last visit in late May 2019. She is compliant to Metformin.   She's checking her blood sugars 3-4 times daily with readings of: AM fasting: 300-400's Before Lunch: 300-400's Before Dinner: 300-400's  Also managed on Lyrica 300 mg BID for diabetic neuropathy. She's also taking Tylenol. She continues to experience numbness/tingling pain to upper and lower extremities and the Lyrica is no longer helping. She notified her endocrinologist who directed her to her PCP for treatment. She does take Requip nightly for restless leg syndrome.   2) CHF/Essential Hypertension: Currently following with cardiology with her last visit being several weeks ago. Currently managed on metolazone 2.5 mg 2-3 times weekly. Also managed on spironolactone 50 mg daily, torsemide 10 mg (20-40 mg BID).  She's weighing herself daily, weight is "fluctuating". She's been stable at 286 for the last several days.   She endorses passing out in her husbands car three times within a few minutes several weeks ago. It was very hot that day and her husbands car didn't have air conditioning. She did notify her cardiologist. She is followed by Hospice and is evaluated by a nurse every two weeks.   Wt Readings from Last 3 Encounters:  12/13/17 286 lb 12 oz (130.1 kg)  12/06/17 288 lb 3.2 oz (130.7 kg)  09/01/17 285 lb 12.8 oz (129.6 kg)     BP Readings from Last 3 Encounters:  12/13/17 124/72  12/06/17 90/68  10/19/17 106/65     3) Bipolar Disorder: Currently following with psychiatry and is managed on fluoxetine 40 mg daily, Buspar 30 mg BID, Trazodone 50 mg, alprazolam PRN.   4) COPD: Currently managed on  Symbicort, albuterol HFA, Atrovent nebulizer. She's also on home oxygen at night, also uses it during the day if she takes a nap. When resting or driving she'll notice her oxygen saturation drop into the 80's. She was previously following with pulmonology several months ago but was dismissed due to a missed appointment. She has chronic dyspnea. She did quit smoking cigarettes.     Review of Systems  Respiratory:       Chronic dyspnea  Cardiovascular: Negative for chest pain and leg swelling.  Neurological: Positive for numbness.       Neuropathic pain to upper and lower extremities  Psychiatric/Behavioral:       Feels well managed on current regimen.       Past Medical History:  Diagnosis Date  . Anxiety    takes Prozac daily  . Anxiety   . Aortic valve calcification   . Asthma    Advair and Spirva daily  . Asthma   . Bipolar disorder ([place])   . CAD (coronary artery disease)    a. LHC 11/2013 done for CP/fluid retention: mild disease in prox LAD, mild-mod disease in mRCA, EF 60% with normal LVEDP. b. Normal nuc 03/2016.  Marland Kitchen CHF (congestive heart failure) ([place])   . Chronic diastolic CHF (congestive heart failure) ([place])   . CKD (chronic kidney disease), stage II   . COPD (chronic obstructive pulmonary disease) ([facility])    a. nocturnal O2.  Marland Kitchen COPD (chronic obstructive pulmonary disease) (  [place])   . Coronary artery disease   . Diabetes mellitus   . Diabetes mellitus without complication ([place])   . Dyspnea   . Family history of adverse reaction to anesthesia    mom gets nauseated  . GERD (gastroesophageal reflux disease)    takes Pepcid daily  . History of blood clots    left leg 3-69yrs ago  . Hyperlipidemia   . Hypertension   . Hypertriglyceridemia   . Inguinal hernia, left 01/2015  . Muscle spasm   . Peripheral neuropathy   . RBBB   . Seizures ([employer])   . Sinus tachycardia    a. persistent since 2009.  Marland Kitchen Smokers' cough ([place])   . Stroke [facility]) 1989   left sided weakness  . TIA  (transient ischemic attack)   . Tobacco abuse      Social History   Socioeconomic History  . Marital status: Married    Spouse name: Not on file  . Number of children: Not on file  . Years of education: Not on file  . Highest education level: Not on file  Occupational History  . Not on file  Social Needs  . Financial resource strain: Not on file  . Food insecurity:    Worry: Not on file    Inability: Not on file  . Transportation needs:    Medical: Not on file    Non-medical: Not on file  Tobacco Use  . Smoking status: Current Some Day Smoker    Packs/day: 0.50    Years: 30.00    Pack years: 15.00    Types: Cigarettes    Last attempt to quit: 01/08/2017    Years since quitting: 0.9  . Smokeless tobacco: Never Used  Substance and Sexual Activity  . Alcohol use: No    Frequency: Never  . Drug use: No  . Sexual activity: Never  Lifestyle  . Physical activity:    Days per week: Not on file    Minutes per session: Not on file  . Stress: Not on file  Relationships  . Social connections:    Talks on phone: Not on file    Gets together: Not on file    Attends religious service: Not on file    Active member of club or organization: Not on file    Attends meetings of clubs or organizations: Not on file    Relationship status: Not on file  . Intimate partner violence:    Fear of current or ex partner: Not on file    Emotionally abused: Not on file    Physically abused: Not on file    Forced sexual activity: Not on file  Other Topics Concern  . Not on file  Social History Narrative   ** Merged History Encounter **       Lives in [place]   Married but lives with Boyfriend - not legally separated   Disabled - for BiPolar, Seizure disorder, diabetes   Formerly worked at [employer]          Past Surgical History:  Procedure Laterality Date  . HERNIA REPAIR    . INGUINAL HERNIA REPAIR Left 04/08/2015   Procedure: OPEN LEFT INGUINAL HERNIA REPAIR WITH MESH;  Surgeon:  Ralene Ok, MD;  Location: [place];  Service: General;  Laterality: Left;  . INSERTION OF MESH Left 04/08/2015   Procedure: INSERTION OF MESH;  Surgeon: Ralene Ok, MD;  Location: [place];  Service: General;  Laterality: Left;  . LAPAROSCOPY  Endometriosis  . LEFT HEART CATHETERIZATION WITH CORONARY ANGIOGRAM N/A 12/05/2013   Procedure: LEFT HEART CATHETERIZATION WITH CORONARY ANGIOGRAM;  Surgeon: Jettie Booze, MD;  Location: [facility] CATH LAB;  Service: Cardiovascular;  Laterality: N/A;  . right kidney drained    . TEE WITHOUT CARDIOVERSION N/A 11/28/2013   Procedure: TRANSESOPHAGEAL ECHOCARDIOGRAM (TEE);  Surgeon: Thayer Headings, MD;  Location[facility] ENDOSCOPY;  Service: Cardiovascular;  Laterality: N/A;    Family History  Problem Relation Age of Onset  . Venous thrombosis Brother   . Other Brother        BRAIN TUMOR  . Asthma Father   . Diabetes Father   . Coronary artery disease Mother   . Hypertension Mother   . Diabetes Mother   . Asthma Sister   . Diabetes type II Brother     Allergies  Allergen Reactions  . Metoprolol Shortness Of Breath    Occurrence of shortness of breath after 3 days  . Montelukast Shortness Of Breath  . Morphine Sulfate Shortness Of Breath and Nausea And Vomiting    Swollen Throat - Able to tolerate dilaudid  . Penicillins Hives and Shortness Of Breath    Swelling on throat Has patient had a PCN reaction causing immediate rash, facial/tongue/throat swelling, SOB or lightheadedness with hypotension: Yes Has patient had a PCN reaction causing severe rash involving mucus membranes or skin necrosis: No Has patient had a PCN reaction that required hospitalization Yes Has patient had a PCN reaction occurring within the last 10 years: No If all of the above answers are "NO", then may proceed with Cephalosporin use.   . Prednisone Anaphylaxis  . Diltiazem Swelling  . Gabapentin Swelling    Current Outpatient Medications on File Prior to Visit    Medication Sig Dispense Refill  . acetaminophen (TYLENOL) 325 MG tablet Take 2 tablets (650 mg total) by mouth every 4 (four) hours as needed for headache or mild pain. 30 tablet 2  . albuterol (PROVENTIL HFA) 108 (90 Base) MCG/ACT inhaler INHALE TWO (2) PUFFS EVERY FOUR HOURS ASNEEDED FOR SHORTNESS OF BREATH 6.7 g 0  . aspirin EC 81 MG tablet Take 81 mg by mouth every morning.    . budesonide-formoterol (SYMBICORT) 80-4.5 MCG/ACT inhaler INHALE 2 PUFFS EVERY 12 HOURS TO PREVENTCOUGH OR WHEEZE ** RINSE MOUTH AFTER USING ** 10.2 g 0  . busPIRone (BUSPAR) 30 MG tablet Take 1 tablet (30 mg total) by mouth 2 (two) times daily. 60 tablet 0  . FLUoxetine (PROZAC) 40 MG capsule Take 40 mg by mouth daily.    Marland Kitchen glucose blood (ONE TOUCH ULTRA TEST) test strip Used to check blood sugars 4x daily. 200 each 4  . Insulin Pen Needle 32G X 4 MM MISC Used to give insulin injections twice daily. 100 each 11  . insulin regular human CONCENTRATED (HUMULIN R U-500 KWIKPEN) 500 UNIT/ML kwikpen Inject 400 Units into the skin 2 (two) times daily with a meal. (Patient taking differently: Inject 175-300 Units into the skin 2 (two) times daily with a meal. ) 16 pen 11  . ipratropium-albuterol (DUONEB) 0.5-2.5 (3) MG/3ML SOLN Take 3 mLs by nebulization every 4 (four) hours as needed (wheezing/shortness of breath). 360 mL 0  . linaclotide (LINZESS) 290 MCG CAPS capsule Take 1 capsule (290 mcg total) by mouth daily before breakfast. 90 capsule 0  . LYRICA 300 MG capsule TAKE 1 CAPSULE BY MOUTH TWICE DAILY FOR NEUROPATHY 180 capsule 0  . metFORMIN (GLUCOPHAGE) 1000 MG tablet Take  1,000 mg by mouth 2 (two) times daily with a meal.    . metolazone (ZAROXOLYN) 2.5 MG tablet Take 2.5 mg by mouth every other day. Take 2.5 mg by mouth every 48 hours on Tuesday, Thursday and Saturday    . OXYGEN Inhale 4 L into the lungs at bedtime.     Marland Kitchen PRESCRIPTION MEDICATION Place 1 drop into both eyes daily as needed (every 14 weeks before  injections).    . ranitidine (ZANTAC) 150 MG tablet Take 150 mg by mouth 2 (two) times daily.     Marland Kitchen rOPINIRole (REQUIP) 0.5 MG tablet Take 1 tablet (0.5 mg total) by mouth at bedtime. 90 tablet 0  . Semaglutide (OZEMPIC) 0.25 or 0.5 MG/DOSE SOPN Inject 0.5 mg into the skin once a week. 4 pen 11  . spironolactone (ALDACTONE) 50 MG tablet Take 50 mg by mouth daily.    Marland Kitchen torsemide (DEMADEX) 10 MG tablet Take 3 tablets by mouth once daily for swelling and fluid retention. (Patient taking differently: Take 20-40 mg by mouth 2 (two) times daily. 40 mg in the morning and 20 mg in the evening) 90 tablet 0  . traZODone (DESYREL) 50 MG tablet Take 150 mg by mouth at bedtime.     . ALPRAZolam (XANAX) 1 MG tablet Take 1 mg by mouth 2 (two) times daily.     . nicotine (NICODERM CQ - DOSED IN MG/24 HOURS) 21 mg/24hr patch Place 1 patch (21 mg total) at bedtime onto the skin. (Patient not taking: Reported on 12/13/2017) 28 patch 1  . ondansetron (ZOFRAN) 4 MG tablet Take 4 mg by mouth every 6 (six) hours as needed for nausea or vomiting.     No current facility-administered medications on file prior to visit.     BP 124/72   Pulse 94   Temp 98 F (36.7 C) (Oral)   Ht 5\' 6"  (1.676 m)   Wt 286 lb 12 oz (130.1 kg)   LMP 11/11/2011   SpO2 93%   BMI 46.28 kg/m    Objective:   Physical Exam  Constitutional: She appears well-nourished.  Neck: Neck supple.  Cardiovascular: Normal rate and regular rhythm.  Respiratory: Effort normal and breath sounds normal. She has no wheezes.  Skin: Skin is warm and dry.  Psychiatric: She has a normal mood and affect.           Assessment & Plan:

## 2017-12-13 NOTE — Assessment & Plan Note (Signed)
Feels well managed on current regimen, following with psychiatry.

## 2017-12-14 ENCOUNTER — Other Ambulatory Visit: Payer: Self-pay | Admitting: Primary Care

## 2017-12-14 ENCOUNTER — Institutional Professional Consult (permissible substitution): Payer: BLUE CROSS/BLUE SHIELD | Admitting: Internal Medicine

## 2017-12-15 IMAGING — CR DG CHEST 2V
2 series · 2 of 2 positions shown · non-contrast
Comparison: 02/28/2017

CLINICAL DATA: R failure

EXAM:
CHEST  2 VIEW

[chest lat]
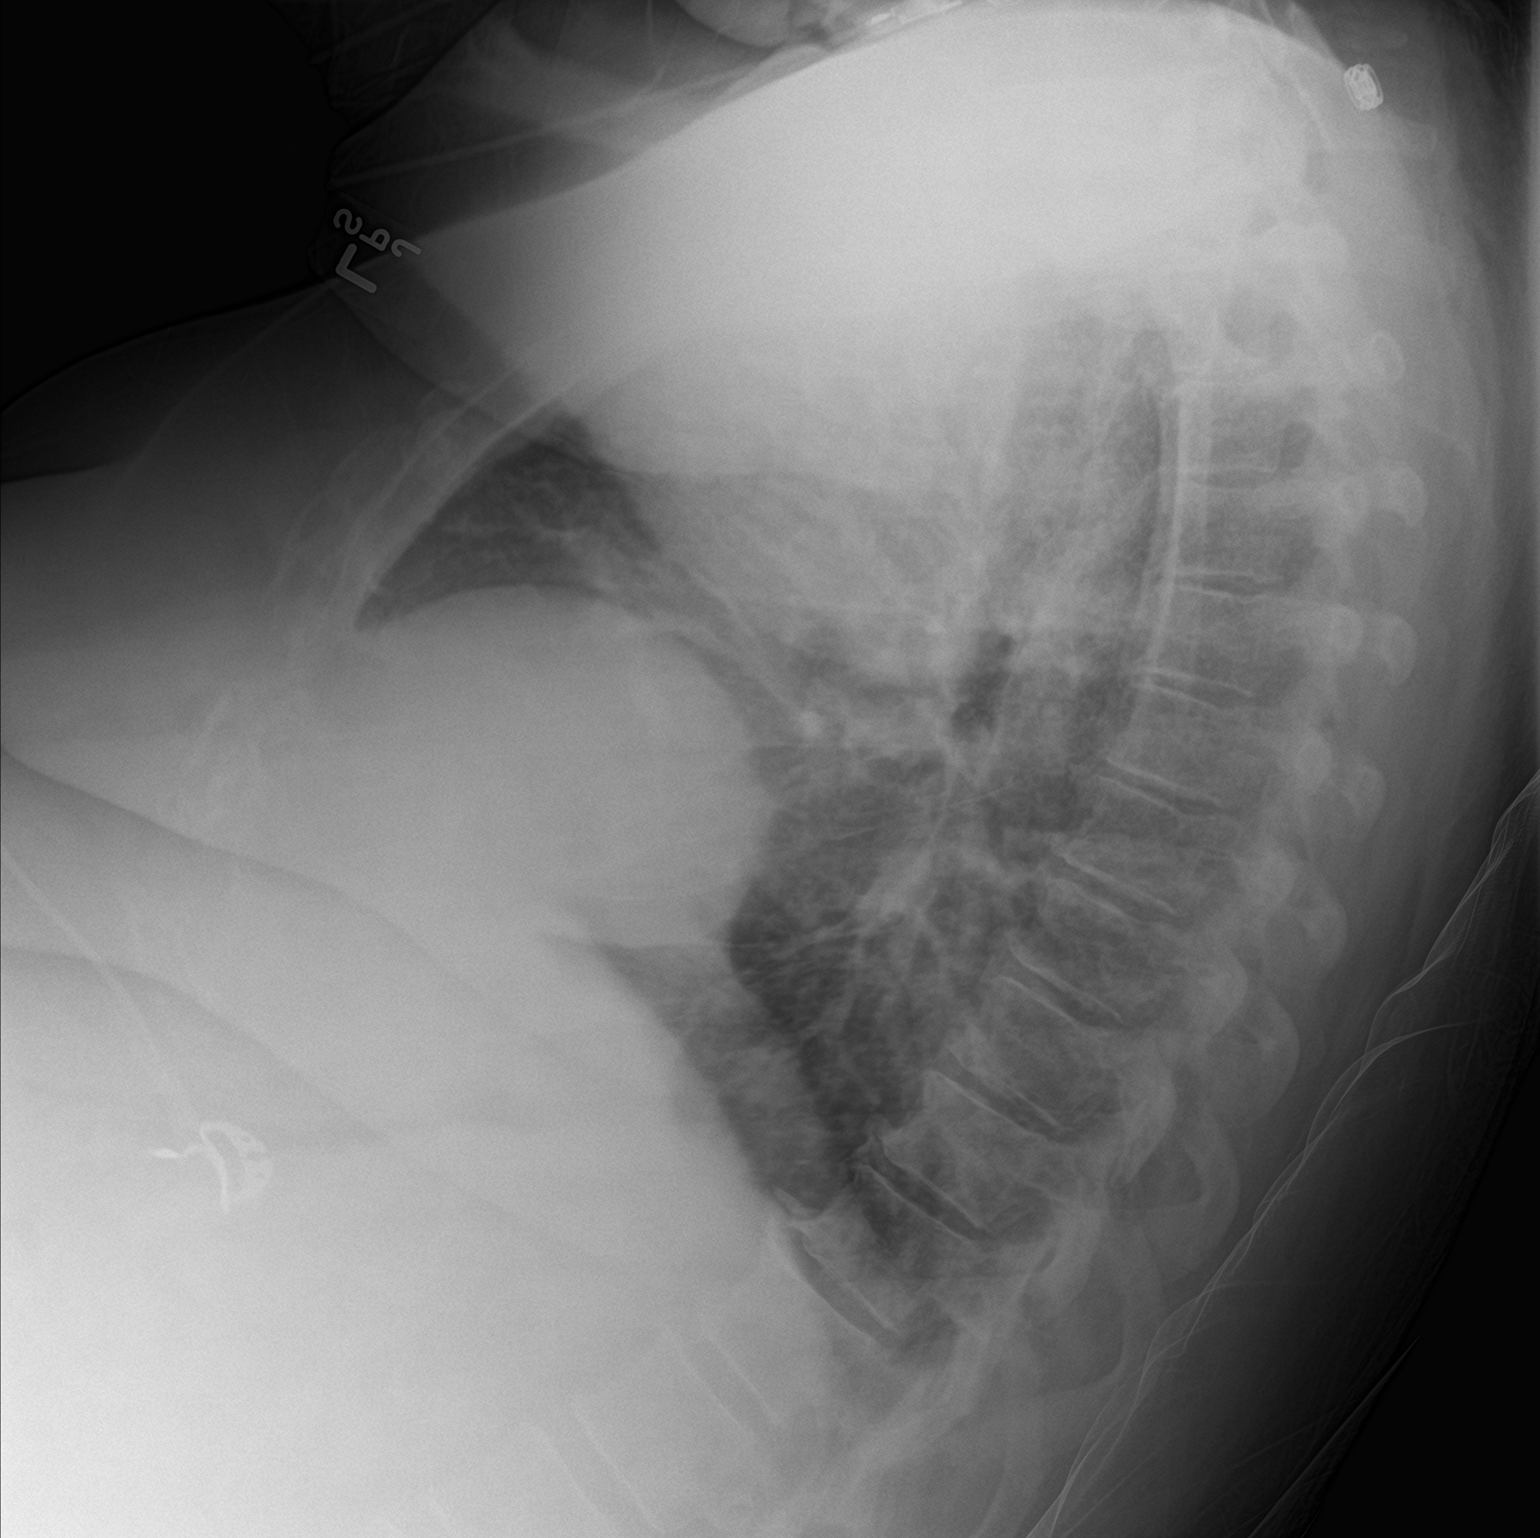

[chest ap]
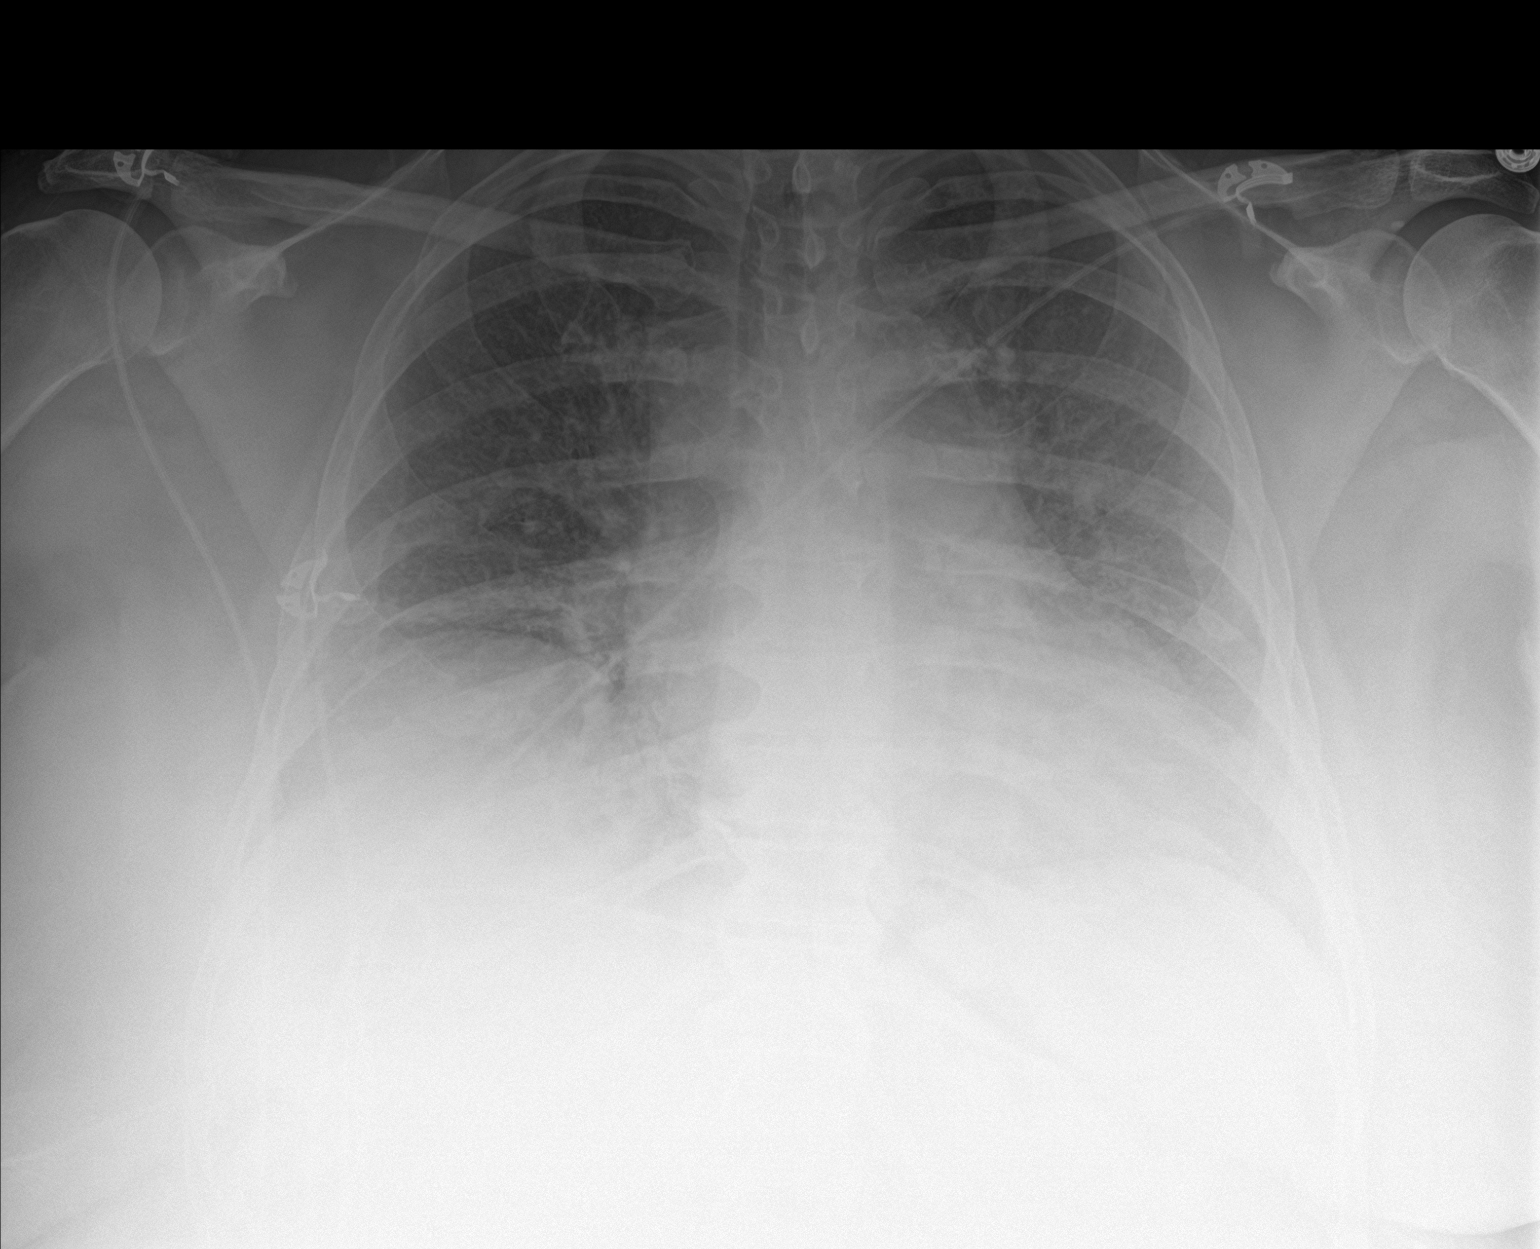

[2 of 2 positions shown; findings below may reference images not displayed]

FINDINGS: There is bilateral mild interstitial thickening. There is no focal
parenchymal opacity. There is no pleural effusion or pneumothorax.
There is stable cardiomegaly. There is elevation of the right
diaphragm.

The osseous structures are unremarkable.
IMPRESSION: Cardiomegaly with mild pulmonary vascular congestion.

## 2017-12-15 IMAGING — CT CT HEAD CODE STROKE
4 of 5 series · 15 of 47 positions shown, 17 images · non-contrast
Comparison: CT head 02/28/2017

CLINICAL DATA: Code stroke.  Altered level of consciousness

EXAM:
CT HEAD WITHOUT CONTRAST
TECHNIQUE: Contiguous axial images were obtained from the base of the skull
through the vertex without intravenous contrast.

[Series 3: head without · axial · non-contrast · 0.40mm/px · z∈[-108,-18]mm · 4 of 32 slices shown]
[im 7/32  brain]
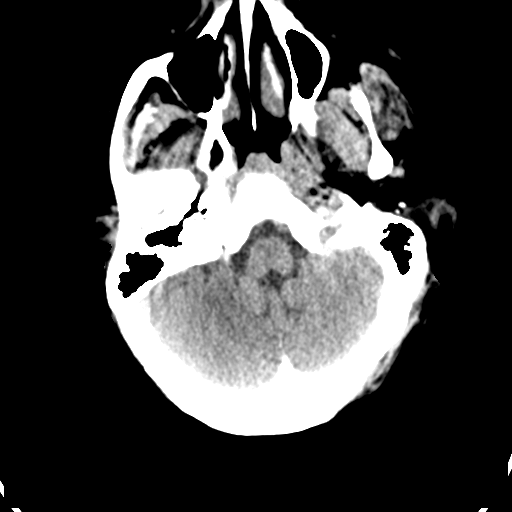
[im 13/32  brain]
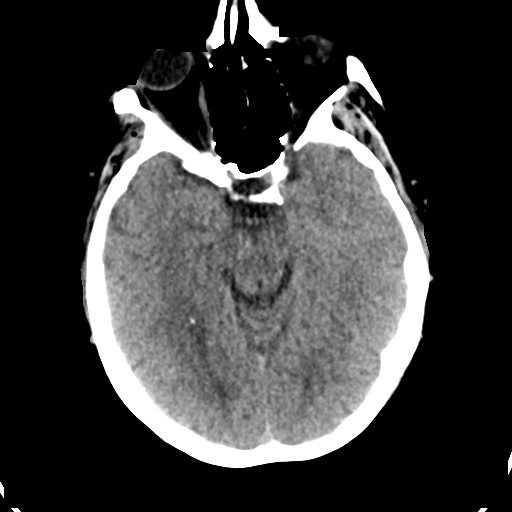
[im 19/32  brain]
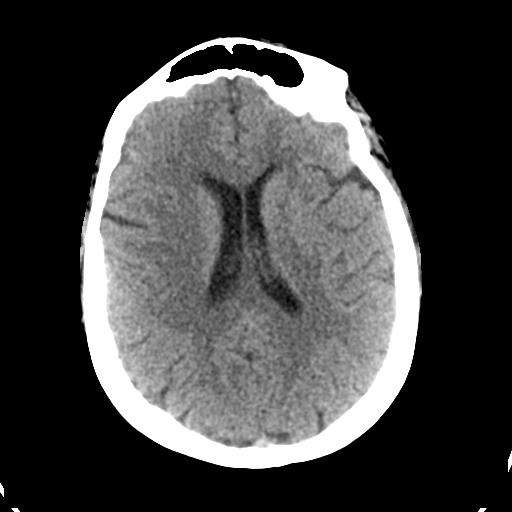
[im 25/32  brain]
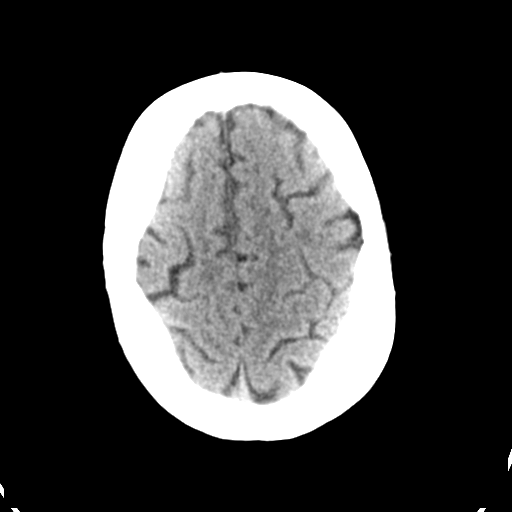

[Series 5: head without cor · coronal · non-contrast · 0.31mm/px · 3 of 67 slices shown]
[im 23/67  brain]
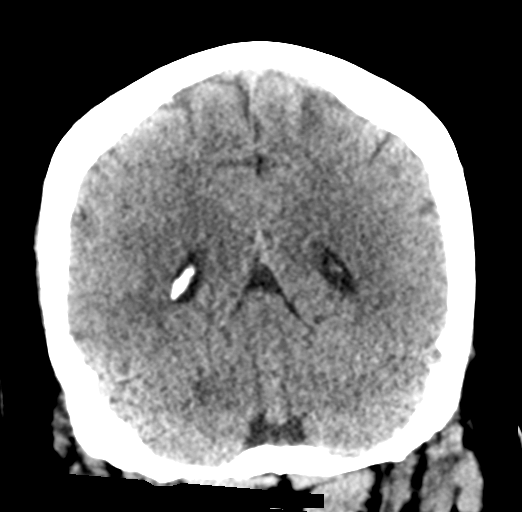
[im 30/67  brain]
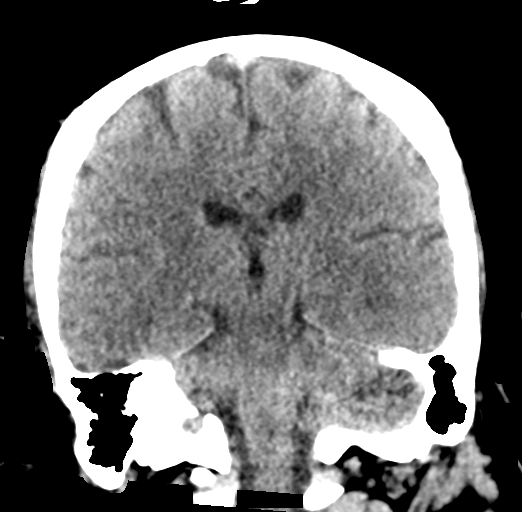
[im 37/67  brain]
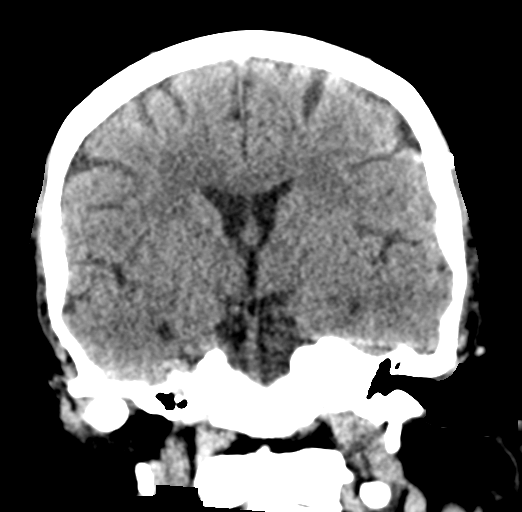

[Series 6: head without sag · sagittal · non-contrast · 0.31mm/px · 3 of 56 slices shown]
[im 19/56  brain]
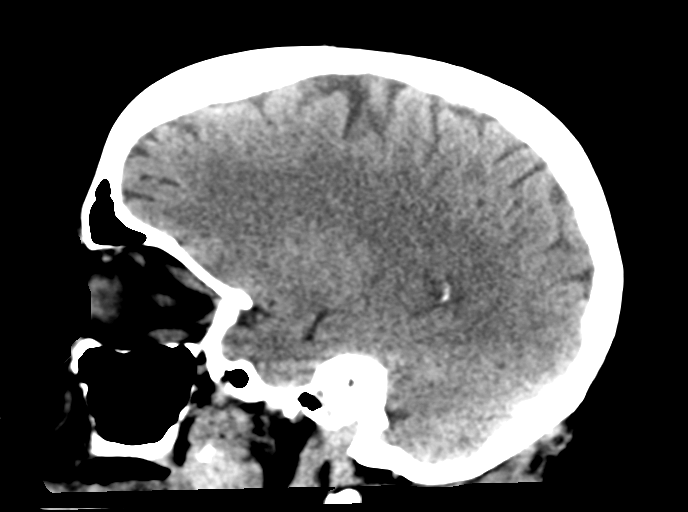
[im 28/56  brain]
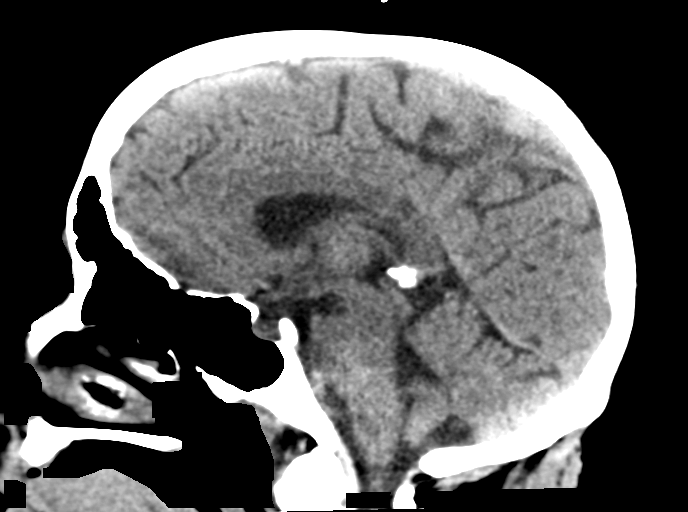
[im 37/56  brain]
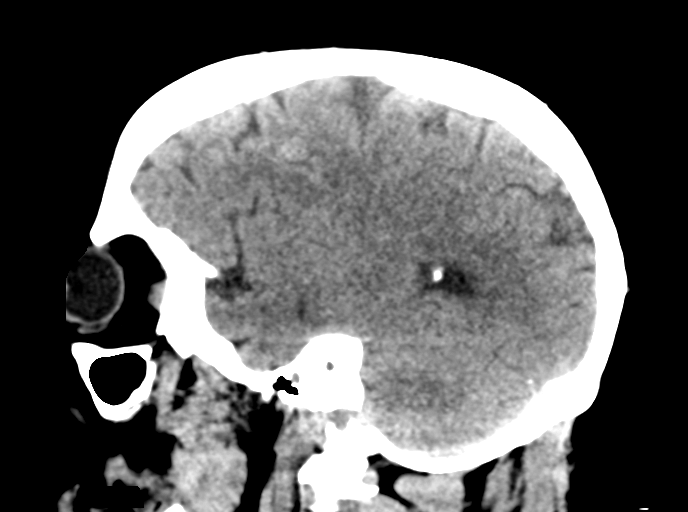

[Series 7: head without (person_name) · axial · non-contrast · 0.40mm/px · z∈[-113,-13]mm · 5 of 32 slices shown, 7 images]
[im 6/32  brain]
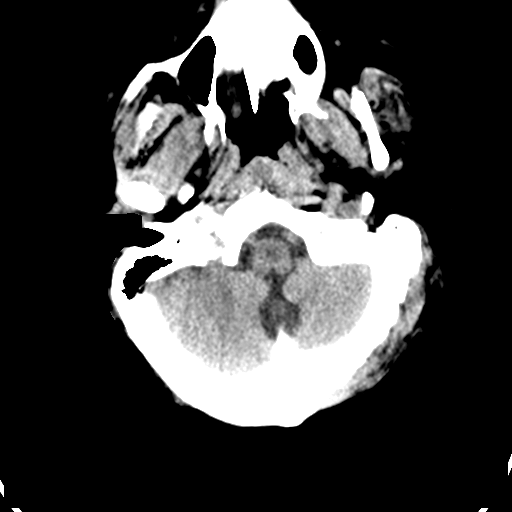
[im 6/32  bone]
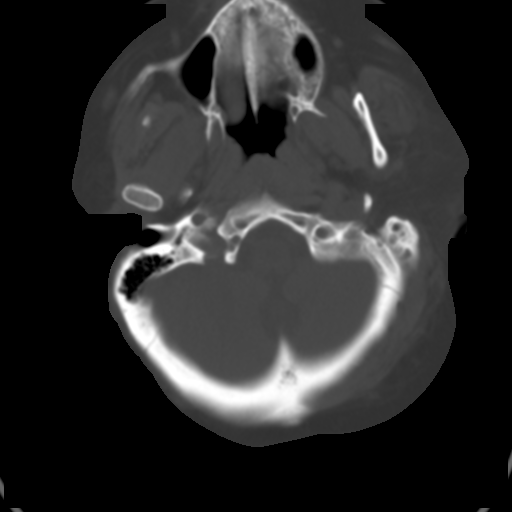
[im 11/32  brain]
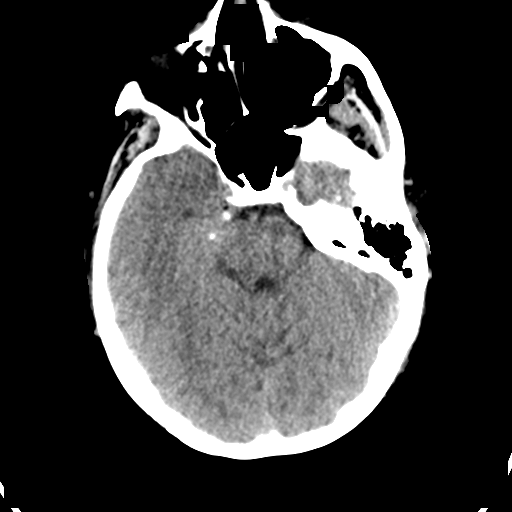
[im 16/32  brain]
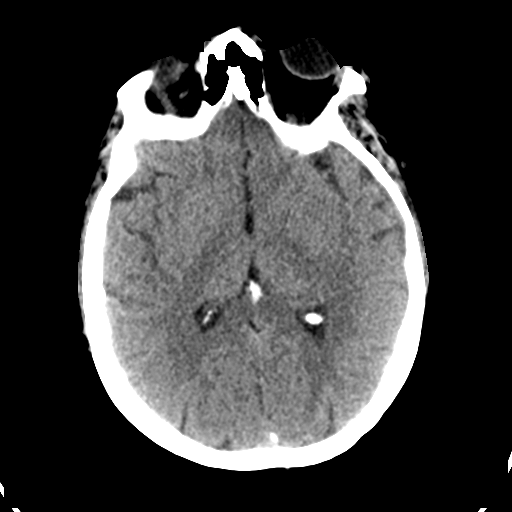
[im 21/32  brain]
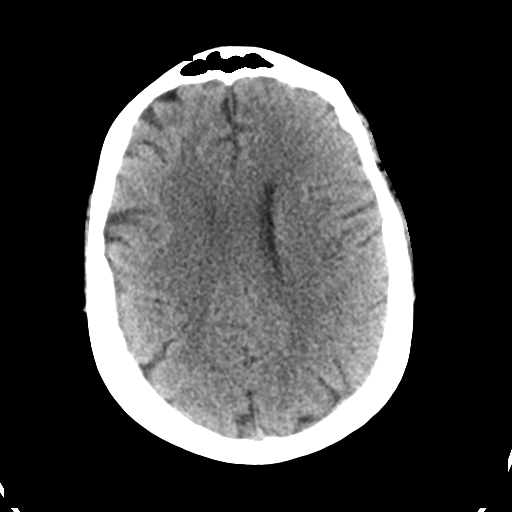
[im 26/32  brain]
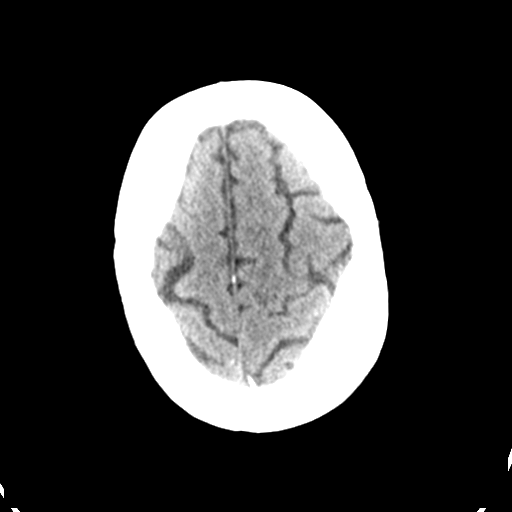
[im 26/32  bone]
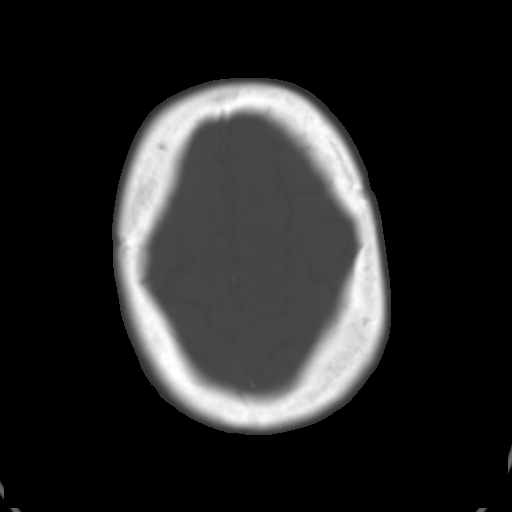

[15 of 47 positions shown; findings below may reference images not displayed]

FINDINGS: Brain: No evidence of acute infarction, hemorrhage, hydrocephalus,
extra-axial collection or mass lesion/mass effect.

Vascular: Negative for hyperdense vessel

Skull: Negative

Sinuses/Orbits: Negative

Other: Round metal BB in the left frontal scalp.

ASPECTS ([place]berta Stroke Program Early CT Score)

- Ganglionic level infarction (caudate, lentiform nuclei, internal
capsule, insula, M1-M3 cortex): 7

- Supraganglionic infarction (M4-M6 cortex): 3

Total score (0-10 with 10 being normal): 10
IMPRESSION: 1. Negative CT Head
2. ASPECTS is 10
3. BB in the left frontal scalp. This is likely safe for MRI but may
cause artifact

These results were called by telephone at the time of interpretation
on 03/17/2017 at [DATE] to Dr. MUSTE SHAFI GODE BIRO , who verbally
acknowledged these results.

## 2017-12-19 ENCOUNTER — Institutional Professional Consult (permissible substitution): Payer: BLUE CROSS/BLUE SHIELD | Admitting: Internal Medicine

## 2017-12-20 NOTE — Progress Notes (Signed)
[facility] Consultation      Assessment and Plan:  Restrictive lung disease,  with chronic hypoxic respiratory failure Possible hypoventilation. Elevated right diaphragm - Restrictive lung disease likely secondary to obesity and elevated right diaphragm - We will send for pulmonary function test to look for evidence of restrictive lung disease and/or COPD. -We will send for ABG to look for evidence of hypercapnic respiratory failure.   Obesity with deconditioning.  - Obesity likely can contribute into dyspnea hypoxia.  Recommend weight loss. - Patient appears to have severe deconditioning with leg weakness.  Recommend increase her physical activity.  Excessive daytime sleepiness. - We will send for sleep study to rule out obstructive sleep apnea.  Orders Placed This Encounter  Procedures  . Ambulatory Referral for DME  . Pulmonary Function Test [facility] Only  . Pulmonary Function Test [facility] Only  . Split night study   Return in about 3 months (around 03/23/2018).    Date: 12/21/2017  MRN# [id number] Parnika Tweten 12-30-1970  Referring Physician: By PCP  [name] is a 47 y.o. old female seen in consultation for chief complaint of:    Chief Complaint  Patient presents with  . Consult    SOB at all times: passes out: wheezing: prod cough w/brown mucus    HPI:   The patient is a 47 year old female with severe COPD, oxygen dependent, she was previously seeing Dr. Melvyn Novas but dismissed from the practice due to missed appointments. She missed her last appointment here on 12/14/17.  She has severe debility/deconditioning, she was maintained on Symbicort, referred to pulmonary rehab. She was also scheduled for a sleep study on 02/06/17 but did not go.  She states her edema is well controlled with current dosing of Demedex.  Pt notes that she has dyspnea with walking, especially when it is hot and humid. She is not smoking her husband smokes half ppd but the  patient smells strongly of smoke.  She has 2 dogs, one sleeps in bed with her.  She uses an symbicort inhaler which she uses 2 puffs twice per day, albuterol MDI 2 or 3 times per day. She uses to be on spiriva but it made her throat close up. She tried advair but it gave her vaginal infections apparently.  She is currently on 4L oxygen at night. She does not use portable oxygen.   **Desat walk at rest on RA. Sat was 83% HR 87. Walked 180 feet at slow waddling pace complained of dyspnea, but appeared only mildly dyspneic. Sat dropped to 86% and HR 92.  Oxygen @2L  at rest--91% at rest.    **Imaging personally reviewed CXR 03/29/2017>> change to chronic bronchitis, elevated right diaphragm. CXR : 03/17/2017>>Cardiomegaly with mild pulmonary vascular congestion. Echo 03/15/2017>> EF 65-70% 08/22/2016>> Spirometry shows moderate restriction. Sleep study 02/21/14; negative for OSA.   PMHX:   Past Medical History:  Diagnosis Date  . Anxiety    takes Prozac daily  . Anxiety   . Aortic valve calcification   . Asthma    Advair and Spirva daily  . Asthma   . Bipolar disorder ([place])   . CAD (coronary artery disease)    a. LHC 11/2013 done for CP/fluid retention: mild disease in prox LAD, mild-mod disease in mRCA, EF 60% with normal LVEDP. b. Normal nuc 03/2016.  Marland Kitchen CHF (congestive heart failure) ([place])   . Chronic diastolic CHF (congestive heart failure) ([place])   . CKD (chronic kidney disease), stage II   .  COPD (chronic obstructive pulmonary disease) ([facility])    a. nocturnal O2.  Marland Kitchen COPD (chronic obstructive pulmonary disease) ([place])   . Coronary artery disease   . Diabetes mellitus   . Diabetes mellitus without complication ([place])   . Dyspnea   . Family history of adverse reaction to anesthesia    mom gets nauseated  . GERD (gastroesophageal reflux disease)    takes Pepcid daily  . History of blood clots    left leg 3-69yrs ago  . Hyperlipidemia   . Hypertension   . Hypertriglyceridemia   .  Inguinal hernia, left 01/2015  . Muscle spasm   . Peripheral neuropathy   . RBBB   . Seizures ([place])   . Sinus tachycardia    a. persistent since 2009.  Marland Kitchen Smokers' cough ([place])   . Stroke [facility] 1989   left sided weakness  . TIA (transient ischemic attack)   . Tobacco abuse    Surgical Hx:  Past Surgical History:  Procedure Laterality Date  . HERNIA REPAIR    . INGUINAL HERNIA REPAIR Left 04/08/2015   Procedure: OPEN LEFT INGUINAL HERNIA REPAIR WITH MESH;  Surgeon: Ralene Ok, MD;  Location: [facility];  Service: General;  Laterality: Left;  . INSERTION OF MESH Left 04/08/2015   Procedure: INSERTION OF MESH;  Surgeon: Ralene Ok, MD;  Location: [facility];  Service: General;  Laterality: Left;  . LAPAROSCOPY     Endometriosis  . LEFT HEART CATHETERIZATION WITH CORONARY ANGIOGRAM N/A 12/05/2013   Procedure: LEFT HEART CATHETERIZATION WITH CORONARY ANGIOGRAM;  Surgeon: Jettie Booze, MD;  Location: [facility] CATH LAB;  Service: Cardiovascular;  Laterality: N/A;  . right kidney drained    . TEE WITHOUT CARDIOVERSION N/A 11/28/2013   Procedure: TRANSESOPHAGEAL ECHOCARDIOGRAM (TEE);  Surgeon: Thayer Headings, MD;  Location: [facility] ENDOSCOPY;  Service: Cardiovascular;  Laterality: N/A;   Family Hx:  Family History  Problem Relation Age of Onset  . Venous thrombosis Brother   . Other Brother        BRAIN TUMOR  . Asthma Father   . Diabetes Father   . Coronary artery disease Mother   . Hypertension Mother   . Diabetes Mother   . Asthma Sister   . Diabetes type II Brother    Social Hx:   Social History   Tobacco Use  . Smoking status: Former Smoker    Packs/day: 0.50    Years: 30.00    Pack years: 15.00    Types: Cigarettes    Last attempt to quit: 11/08/2016    Years since quitting: 1.1  . Smokeless tobacco: Never Used  Substance Use Topics  . Alcohol use: No    Frequency: Never  . Drug use: No   Medication:    Current Outpatient Medications:  .  acetaminophen (TYLENOL) 325  MG tablet, Take 2 tablets (650 mg total) by mouth every 4 (four) hours as needed for headache or mild pain., Disp: 30 tablet, Rfl: 2 .  albuterol (PROVENTIL HFA) 108 (90 Base) MCG/ACT inhaler, INHALE TWO (2) PUFFS EVERY FOUR HOURS ASNEEDED FOR SHORTNESS OF BREATH, Disp: 6.7 g, Rfl: 0 .  ALPRAZolam (XANAX) 1 MG tablet, Take 1 mg by mouth 2 (two) times daily. , Disp: , Rfl:  .  aspirin EC 81 MG tablet, Take 81 mg by mouth every morning., Disp: , Rfl:  .  budesonide-formoterol (SYMBICORT) 80-4.5 MCG/ACT inhaler, INHALE 2 PUFFS EVERY 12 HOURS TO PREVENTCOUGH OR WHEEZING **RINSE MOUTH AFTER USING**, Disp: 10.2  g, Rfl: 0 .  busPIRone (BUSPAR) 30 MG tablet, Take 1 tablet (30 mg total) by mouth 2 (two) times daily., Disp: 60 tablet, Rfl: 0 .  FLUoxetine (PROZAC) 40 MG capsule, Take 40 mg by mouth daily., Disp: , Rfl:  .  glucose blood (ONE TOUCH ULTRA TEST) test strip, Used to check blood sugars 4x daily., Disp: 200 each, Rfl: 4 .  Insulin Pen Needle 32G X 4 MM MISC, Used to give insulin injections twice daily., Disp: 100 each, Rfl: 11 .  insulin regular human CONCENTRATED (HUMULIN R U-500 KWIKPEN) 500 UNIT/ML kwikpen, Inject 400 Units into the skin 2 (two) times daily with a meal. (Patient taking differently: Inject 175-300 Units into the skin 2 (two) times daily with a meal. ), Disp: 16 pen, Rfl: 11 .  ipratropium-albuterol (DUONEB) 0.5-2.5 (3) MG/3ML SOLN, Take 3 mLs by nebulization every 4 (four) hours as needed (wheezing/shortness of breath)., Disp: 360 mL, Rfl: 0 .  LYRICA 300 MG capsule, TAKE 1 CAPSULE BY MOUTH TWICE DAILY FOR NEUROPATHY, Disp: 180 capsule, Rfl: 0 .  metFORMIN (GLUCOPHAGE) 1000 MG tablet, Take 1,000 mg by mouth 2 (two) times daily with a meal., Disp: , Rfl:  .  metolazone (ZAROXOLYN) 2.5 MG tablet, Take 2.5 mg by mouth every other day. Take 2.5 mg by mouth every 48 hours on Tuesday, Thursday and Saturday, Disp: , Rfl:  .  nicotine (NICODERM CQ - DOSED IN MG/24 HOURS) 21 mg/24hr patch,  Place 1 patch (21 mg total) at bedtime onto the skin., Disp: 28 patch, Rfl: 1 .  ondansetron (ZOFRAN) 4 MG tablet, Take 4 mg by mouth every 6 (six) hours as needed for nausea or vomiting., Disp: , Rfl:  .  OXYGEN, Inhale 4 L into the lungs at bedtime. , Disp: , Rfl:  .  PRESCRIPTION MEDICATION, Place 1 drop into both eyes daily as needed (every 14 weeks before injections)., Disp: , Rfl:  .  ranitidine (ZANTAC) 150 MG tablet, Take 150 mg by mouth 2 (two) times daily. , Disp: , Rfl:  .  rOPINIRole (REQUIP) 0.5 MG tablet, Take 1 tablet (0.5 mg total) by mouth at bedtime., Disp: 90 tablet, Rfl: 0 .  Semaglutide (OZEMPIC) 0.25 or 0.5 MG/DOSE SOPN, Inject 0.5 mg into the skin once a week., Disp: 4 pen, Rfl: 11 .  spironolactone (ALDACTONE) 50 MG tablet, Take 50 mg by mouth daily., Disp: , Rfl:  .  torsemide (DEMADEX) 10 MG tablet, Take 3 tablets by mouth once daily for swelling and fluid retention. (Patient taking differently: Take 20-40 mg by mouth 2 (two) times daily. 40 mg in the morning and 20 mg in the evening), Disp: 90 tablet, Rfl: 0 .  traZODone (DESYREL) 50 MG tablet, Take 150 mg by mouth at bedtime. , Disp: , Rfl:  .  linaclotide (LINZESS) 290 MCG CAPS capsule, Take 1 capsule (290 mcg total) by mouth daily before breakfast., Disp: 90 capsule, Rfl: 0   Allergies:  Metoprolol; Montelukast; Morphine sulfate; Penicillins; Prednisone; Diltiazem; and Gabapentin  Review of Systems: Gen:  Denies  fever, sweats, chills HEENT: Denies blurred vision, double vision. bleeds, sore throat Cvc:  No dizziness, chest pain. Resp:   Denies cough or sputum production, shortness of breath Gi: Denies swallowing difficulty, stomach pain. Gu:  Denies bladder incontinence, burning urine Ext:   No Joint pain, stiffness. Skin: No skin rash,  hives  Endoc:  No polyuria, polydipsia. Psych: No depression, insomnia. Other:  All other systems were reviewed with the  patient and were negative other that what is  mentioned in the HPI.   Physical Examination:   VS: BP 116/60 (BP Location: Left Arm, Cuff Size: Normal)   Pulse 91   Ht 5\' 6"  (1.676 m)   Wt 282 lb (127.9 kg)   LMP 11/11/2011   SpO2 90%   BMI 45.52 kg/m   General Appearance: No distress, obese. Neuro:without focal findings,  speech normal,  HEENT: PERRLA, EOM intact.  Mallampati 4 Pulmonary: Decreased air entry bilaterally. CardiovascularNormal S1,S2.  No m/r/g.   Abdomen: Benign, Soft, non-tender. Renal:  No costovertebral tenderness  GU:  No performed at this time. Endoc: No evident thyromegaly, no signs of acromegaly. Skin:   warm, no rashes, no ecchymosis  Extremities: normal, no cyanosis, clubbing.  Other findings:    LABORATORY PANEL:   CBC No results for input(s): WBC, HGB, HCT, PLT in the last 168 hours. ------------------------------------------------------------------------------------------------------------------  Chemistries  No results for input(s): NA, K, CL, CO2, GLUCOSE, BUN, CREATININE, CALCIUM, MG, AST, ALT, ALKPHOS, BILITOT in the last 168 hours.  Invalid input(s): GFRCGP ------------------------------------------------------------------------------------------------------------------  Cardiac Enzymes No results for input(s): TROPONINI in the last 168 hours. ------------------------------------------------------------  RADIOLOGY:  No results found.     Thank  you for the consultation and for allowing [facility] to assist in the care of your patient. Our recommendations are noted above.  Please contact us if we can be of further service.   Marda Stalker, MD.  Board Certified in Internal Medicine, Pulmonary Medicine, [place], and Sleep Medicine.  [facility] Office Number: [phone]  Patricia Pesa, M.D.  Merton Border, M.D  12/21/2017

## 2017-12-21 ENCOUNTER — Encounter: Payer: Self-pay | Admitting: Internal Medicine

## 2017-12-21 ENCOUNTER — Ambulatory Visit (INDEPENDENT_AMBULATORY_CARE_PROVIDER_SITE_OTHER): Payer: BLUE CROSS/BLUE SHIELD | Admitting: Internal Medicine

## 2017-12-21 VITALS — BP 116/60 | HR 91 | Ht 66.0 in | Wt 282.0 lb

## 2017-12-21 DIAGNOSIS — J984 Other disorders of lung: Secondary | ICD-10-CM | POA: Diagnosis not present

## 2017-12-21 DIAGNOSIS — J9611 Chronic respiratory failure with hypoxia: Secondary | ICD-10-CM

## 2017-12-21 DIAGNOSIS — J449 Chronic obstructive pulmonary disease, unspecified: Secondary | ICD-10-CM | POA: Diagnosis not present

## 2017-12-21 NOTE — Patient Instructions (Addendum)
Will send for PFT, blood gas testing.  Will send for sleep study.  Continue symbicort.  Try to increase your activity level, try to walk 30 minutes every day.  Continue oxygen at night. Start oxygen at 2L with activity.

## 2017-12-26 ENCOUNTER — Encounter (HOSPITAL_COMMUNITY): Payer: Self-pay

## 2017-12-26 ENCOUNTER — Emergency Department (HOSPITAL_COMMUNITY)
Admission: EM | Admit: 2017-12-26 | Discharge: 2017-12-26 | Disposition: A | Discharge status: Home / Self Care | Payer: BLUE CROSS/BLUE SHIELD | Attending: Emergency Medicine | Admitting: Emergency Medicine

## 2017-12-26 DIAGNOSIS — R0902 Hypoxemia: Secondary | ICD-10-CM | POA: Diagnosis not present

## 2017-12-26 DIAGNOSIS — J449 Chronic obstructive pulmonary disease, unspecified: Secondary | ICD-10-CM | POA: Insufficient documentation

## 2017-12-26 DIAGNOSIS — I130 Hypertensive heart and chronic kidney disease with heart failure and stage 1 through stage 4 chronic kidney disease, or unspecified chronic kidney disease: Secondary | ICD-10-CM | POA: Diagnosis not present

## 2017-12-26 DIAGNOSIS — R55 Syncope and collapse: Secondary | ICD-10-CM | POA: Diagnosis not present

## 2017-12-26 DIAGNOSIS — Z87891 Personal history of nicotine dependence: Secondary | ICD-10-CM | POA: Diagnosis not present

## 2017-12-26 DIAGNOSIS — I2510 Atherosclerotic heart disease of native coronary artery without angina pectoris: Secondary | ICD-10-CM | POA: Diagnosis not present

## 2017-12-26 DIAGNOSIS — Z79899 Other long term (current) drug therapy: Secondary | ICD-10-CM | POA: Diagnosis not present

## 2017-12-26 DIAGNOSIS — E1122 Type 2 diabetes mellitus with diabetic chronic kidney disease: Secondary | ICD-10-CM | POA: Diagnosis not present

## 2017-12-26 DIAGNOSIS — Z7982 Long term (current) use of aspirin: Secondary | ICD-10-CM | POA: Insufficient documentation

## 2017-12-26 DIAGNOSIS — Z794 Long term (current) use of insulin: Secondary | ICD-10-CM | POA: Diagnosis not present

## 2017-12-26 DIAGNOSIS — N182 Chronic kidney disease, stage 2 (mild): Secondary | ICD-10-CM | POA: Insufficient documentation

## 2017-12-26 DIAGNOSIS — I5032 Chronic diastolic (congestive) heart failure: Secondary | ICD-10-CM | POA: Diagnosis not present

## 2017-12-26 LAB — CBC WITH DIFFERENTIAL/PLATELET
Basophils Absolute: 0 10*3/uL (ref 0.0–0.1)
Basophils Relative: 0 %
Eosinophils Absolute: 0.3 10*3/uL (ref 0.0–0.7)
Eosinophils Relative: 2 %
HCT: 39.5 % (ref 36.0–46.0)
Hemoglobin: 12.8 g/dL (ref 12.0–15.0)
Lymphocytes Relative: 15 %
Lymphs Abs: 1.9 10*3/uL (ref 0.7–4.0)
MCH: 27.8 pg (ref 26.0–34.0)
MCHC: 32.4 g/dL (ref 30.0–36.0)
MCV: 85.9 fL (ref 78.0–100.0)
Monocytes Absolute: 0.7 10*3/uL (ref 0.1–1.0)
Monocytes Relative: 6 %
Neutro Abs: 9.8 10*3/uL — ABNORMAL HIGH (ref 1.7–7.7)
Neutrophils Relative %: 77 %
Platelets: 208 10*3/uL (ref 150–400)
RBC: 4.6 MIL/uL (ref 3.87–5.11)
RDW: 17.4 % — ABNORMAL HIGH (ref 11.5–15.5)
WBC: 12.7 10*3/uL — ABNORMAL HIGH (ref 4.0–10.5)

## 2017-12-26 LAB — I-STAT BETA HCG BLOOD, ED (MC, WL, AP ONLY): I-stat hCG, quantitative: 14.8 m[IU]/mL — ABNORMAL HIGH (ref <5)

## 2017-12-26 LAB — COMPREHENSIVE METABOLIC PANEL
ALT: 37 U/L (ref 14–54)
AST: 35 U/L (ref 15–41)
Albumin: 3.8 g/dL (ref 3.5–5.0)
Alkaline Phosphatase: 108 U/L (ref 38–126)
Anion gap: 14 (ref 5–15)
BUN: 49 mg/dL — ABNORMAL HIGH (ref 6–20)
CO2: 34 mmol/L — ABNORMAL HIGH (ref 22–32)
Calcium: 9.6 mg/dL (ref 8.9–10.3)
Chloride: 88 mmol/L — ABNORMAL LOW (ref 101–111)
Creatinine, Ser: 1.55 mg/dL — ABNORMAL HIGH (ref 0.44–1.00)
GFR calc Af Amer: 45 mL/min — ABNORMAL LOW (ref >60)
GFR calc non Af Amer: 39 mL/min — ABNORMAL LOW (ref >60)
Glucose, Bld: 271 mg/dL — ABNORMAL HIGH (ref 65–99)
Potassium: 3.5 mmol/L (ref 3.5–5.1)
Sodium: 136 mmol/L (ref 135–145)
Total Bilirubin: 0.5 mg/dL (ref 0.3–1.2)
Total Protein: 8.3 g/dL — ABNORMAL HIGH (ref 6.5–8.1)

## 2017-12-26 LAB — CBG MONITORING, ED: Glucose-Capillary: 282 mg/dL — ABNORMAL HIGH (ref 65–99)

## 2017-12-26 LAB — I-STAT TROPONIN, ED: Troponin i, poc: 0 ng/mL (ref 0.00–0.08)

## 2017-12-26 NOTE — Discharge Instructions (Signed)
Our shared decision making conversation led to [place] discharging you back home for your syncopal episode likely due to your low oxygen level from the oxygen tank problem.  You were offered further work-up today but understood the risks of missing a more concerning cause of your syncope.  Please follow-up with your primary doctor.  If any symptoms change or worsen, please return to the nearest emergency department.

## 2017-12-26 NOTE — Significant Event (Signed)
Rapid Response Event Note  Overview: Time Called: [place] Time: [id number] Event Type: Neurologic, Respiratory, Unknown  Initial Focused Assessment:  Patient lying on her back on the lobby floor. Per patients family-brother and son- patient had been attending a weight loss class prior to bariatric surgery felt she needed more oxygen. Patient went out to lobby for air and fell out of her chair. Her brother reports she does this frequently and takes days to recover. Patient was garbling speech and mumbling incomprehensible speech. Her oxygen reportedly wasn't functioning properly. Patient placed on O2 and O2 on pulse ox reading 89-85 without oxygen. Patient placed on her baseline of of 5L oxygen. On heart monitor patient reading heart rate of 102. CBG taken due to diabetic status- CBG 273. Per patients family she cant function with a CBG under 200. Patients family not wanting her to go to ED, but patient continued to state she didn't feel right.  Patients speech improved. ED called to aid in transport.   Interventions:  Transferred to ED for work up.          Latisha Lasch A Avonna Iribe

## 2017-12-26 NOTE — ED Notes (Signed)
Bed: QS12 Expected date:  Expected time:  Means of arrival:  Comments: Syncope from ICU

## 2017-12-26 NOTE — ED Triage Notes (Signed)
Patient from classroom 1 attending a weight loss class when she felt hot and dizzy and passed out and fell out of the chair. Her O2 (which is normally set on 5L) was not hooked up correctly and her sats were in 80's. States her head hurts and that this has happened before.

## 2017-12-26 NOTE — ED Provider Notes (Signed)
[facility] DEPT Provider Note   CSN: [id number] Arrival date & time: 12/26/17  1821     History   Chief Complaint No chief complaint on file.   HPI [name] is a 47 y.o. female.  The history is provided by the patient and medical records. No language interpreter was used.  Loss of Consciousness   This is a new problem. The current episode started less than 1 hour ago. The problem has been resolved. She lost consciousness for a period of less than one minute. The problem is associated with normal activity. Associated symptoms include light-headedness. Pertinent negatives include abdominal pain, back pain, chest pain, confusion, congestion, diaphoresis, dizziness, fever, headaches, malaise/fatigue, nausea, palpitations, slurred speech, vomiting and weakness. Treatments tried: oxygen. The treatment provided significant relief. Her past medical history is significant for CAD, CVA, DM, HTN and seizures.    Past Medical History:  Diagnosis Date  . Anxiety    takes Prozac daily  . Anxiety   . Aortic valve calcification   . Asthma    Advair and Spirva daily  . Asthma   . Bipolar disorder ([place])   . CAD (coronary artery disease)    a. LHC 11/2013 done for CP/fluid retention: mild disease in prox LAD, mild-mod disease in mRCA, EF 60% with normal LVEDP. b. Normal nuc 03/2016.  Marland Kitchen CHF (congestive heart failure) ([place])   . Chronic diastolic CHF (congestive heart failure) ([place])   . CKD (chronic kidney disease), stage II   . COPD (chronic obstructive pulmonary disease) (HCC)    a. nocturnal O2.  Marland Kitchen COPD (chronic obstructive pulmonary disease) ([place])   . Coronary artery disease   . Diabetes mellitus   . Diabetes mellitus without complication ([place])   . Dyspnea   . Family history of adverse reaction to anesthesia    mom gets nauseated  . GERD (gastroesophageal reflux disease)    takes Pepcid daily  . History of blood clots    left leg 3-49yrs ago  .  Hyperlipidemia   . Hypertension   . Hypertriglyceridemia   . Inguinal hernia, left 01/2015  . Muscle spasm   . Peripheral neuropathy   . RBBB   . Seizures ([place])   . Sinus tachycardia    a. persistent since 2009.  Marland Kitchen Smokers' cough ([place])   . Stroke [facility]) 1989   left sided weakness  . TIA (transient ischemic attack)   . Tobacco abuse     Patient Active Problem List   Diagnosis Date Noted  . Restless leg syndrome 08/02/2017  . Nocturnal hypoxemia 03/29/2017  . Vision disturbance 02/28/2017  . CKD (chronic kidney disease), stage II 02/28/2017  . Visual loss, bilateral   . Chronic respiratory failure with hypoxia (HCC)/ nocturnal 02 dep  10/28/2016  . Upper airway cough syndrome 08/22/2016  . Cigarette smoker 06/15/2016  . Simple chronic bronchitis ([place])   . Coronary artery disease involving native heart without angina pectoris 05/16/2016  . Acute on chronic diastolic (congestive) heart failure ([place]) 11/04/2015  . Orthopnea 11/03/2015  . Dyspnea   . Bilateral leg edema   . Diabetes ([place])   . Decreased urine stream   . COPD exacerbation ([place]) 10/15/2015  . Fracture of rib, closed 10/15/2015  . Venous stasis of both lower extremities 03/29/2015  . Preventative health care 02/04/2015  . Essential hypertension 01/02/2015  . Inguinal hernia 11/27/2014  . Allergic rhinitis with postnasal drip 01/21/2014  . Aortic valve disorders 04/18/2013  . Numbness and  tingling 04/01/2013  . Sleep apnea 05/22/2012  . Sinus tachycardia 10/09/2011  . Seizure disorder ([place]) 10/09/2011  . Bipolar disorder ([place]) 10/09/2011  . TIA (transient ischemic attack) 06/22/2011  . Constipation 06/15/2011  . Diabetic polyneuropathy ([place]) 06/08/2010  . HYPERTRIGLYCERIDEMIA 07/17/2007  . COPD GOLD 0  05/30/2007  . Morbid (severe) obesity due to excess calories ([place]) 04/23/2007  . Tobacco abuse 09/07/2006  . Asthma, persistent 09/07/2006    Past Surgical History:  Procedure Laterality Date  . HERNIA  REPAIR    . INGUINAL HERNIA REPAIR Left 04/08/2015   Procedure: OPEN LEFT INGUINAL HERNIA REPAIR WITH MESH;  Surgeon: Ralene Ok, MD;  Location: [facility];  Service: General;  Laterality: Left;  . INSERTION OF MESH Left 04/08/2015   Procedure: INSERTION OF MESH;  Surgeon: Ralene Ok, MD;  Location: [facility];  Service: General;  Laterality: Left;  . LAPAROSCOPY     Endometriosis  . LEFT HEART CATHETERIZATION WITH CORONARY ANGIOGRAM N/A 12/05/2013   Procedure: LEFT HEART CATHETERIZATION WITH CORONARY ANGIOGRAM;  Surgeon: Jettie Booze, MD;  Location[facility] C[facility] LAB;  Service: Cardiovascular;  Laterality: N/A;  . right kidney drained    . TEE WITHOUT CARDIOVERSION N/A 11/28/2013   Procedure: TRANSESOPHAGEAL ECHOCARDIOGRAM (TEE);  Surgeon: Thayer Headings, MD;  Location: [facility];  Service: Cardiovascular;  Laterality: N/A;     OB History   None      Home Medications    Prior to Admission medications   Medication Sig Start Date End Date Taking? Authorizing Provider  acetaminophen (TYLENOL) 325 MG tablet Take 2 tablets (650 mg total) by mouth every 4 (four) hours as needed for headache or mild pain. 03/19/17   Patwardhan, Reynold Bowen, MD  albuterol (PROVENTIL HFA) 108 (90 Base) MCG/ACT inhaler INHALE TWO (2) PUFFS EVERY FOUR HOURS ASNEEDED FOR SHORTNESS OF BREATH 11/23/17   Pleas Koch, NP  ALPRAZolam Duanne Moron) 1 MG tablet Take 1 mg by mouth 2 (two) times daily.     [provider]  aspirin EC 81 MG tablet Take 81 mg by mouth every morning.    [provider]  budesonide-formoterol (SYMBICORT) 80-4.5 MCG/ACT inhaler INHALE 2 PUFFS EVERY 12 HOURS TO PREVENTCOUGH OR WHEEZING **RINSE MOUTH AFTER USING** 12/14/17   Pleas Koch, NP  busPIRone (BUSPAR) 30 MG tablet Take 1 tablet (30 mg total) by mouth 2 (two) times daily. 05/05/17   Pleas Koch, NP  FLUoxetine (PROZAC) 40 MG capsule Take 40 mg by mouth daily.    [provider]  glucose blood (ONE TOUCH  ULTRA TEST) test strip Used to check blood sugars 4x daily. 08/23/17   Renato Shin, MD  Insulin Pen Needle 32G X 4 MM MISC Used to give insulin injections twice daily. 08/23/17   Renato Shin, MD  insulin regular human CONCENTRATED (HUMULIN R U-500 KWIKPEN) 500 UNIT/ML kwikpen Inject 400 Units into the skin 2 (two) times daily with a meal. Patient taking differently: Inject 175-300 Units into the skin 2 (two) times daily with a meal.  09/01/17   Renato Shin, MD  ipratropium-albuterol (DUONEB) 0.5-2.5 (3) MG/3ML SOLN Take 3 mLs by nebulization every 4 (four) hours as needed (wheezing/shortness of breath). 08/02/17   Pleas Koch, NP  linaclotide [facility]) 290 MCG CAPS capsule Take 1 capsule (290 mcg total) by mouth daily before breakfast. 06/06/17 12/13/17  Jonathon Bellows, MD  LYRICA 300 MG capsule TAKE 1 CAPSULE BY MOUTH TWICE DAILY FOR NEUROPATHY 10/25/17   Pleas Koch,  NP  metFORMIN (GLUCOPHAGE) 1000 MG tablet Take 1,000 mg by mouth 2 (two) times daily with a meal.    [provider]  metolazone (ZAROXOLYN) 2.5 MG tablet Take 2.5 mg by mouth every other day. Take 2.5 mg by mouth every 48 hours on Tuesday, Thursday and Saturday    [provider]  nicotine (NICODERM CQ - DOSED IN MG/24 HOURS) 21 mg/24hr patch Place 1 patch (21 mg total) at bedtime onto the skin. 05/23/17   Pleas Koch, NP  ondansetron (ZOFRAN) 4 MG tablet Take 4 mg by mouth every 6 (six) hours as needed for nausea or vomiting.    [provider]  OXYGEN Inhale 4 L into the lungs at bedtime.     [provider]  PRESCRIPTION MEDICATION Place 1 drop into both eyes daily as needed (every 14 weeks before injections).    [provider]  ranitidine (ZANTAC) 150 MG tablet Take 150 mg by mouth 2 (two) times daily.     [provider]  rOPINIRole (REQUIP) 0.5 MG tablet Take 1 tablet (0.5 mg total) by mouth at bedtime. 08/02/17   Pleas Koch, NP  Semaglutide (OZEMPIC)  0.25 or 0.5 MG/DOSE SOPN Inject 0.5 mg into the skin once a week. 12/06/17   Renato Shin, MD  spironolactone (ALDACTONE) 50 MG tablet Take 50 mg by mouth daily.    [provider]  torsemide (DEMADEX) 10 MG tablet Take 3 tablets by mouth once daily for swelling and fluid retention. Patient taking differently: Take 20-40 mg by mouth 2 (two) times daily. 40 mg in the morning and 20 mg in the evening 04/04/17   Pleas Koch, NP  traZODone (DESYREL) 50 MG tablet Take 150 mg by mouth at bedtime.     [provider]    Family History Family History  Problem Relation Age of Onset  . Venous thrombosis Brother   . Other Brother        BRAIN TUMOR  . Asthma Father   . Diabetes Father   . Coronary artery disease Mother   . Hypertension Mother   . Diabetes Mother   . Asthma Sister   . Diabetes type II Brother     Social History Social History   Tobacco Use  . Smoking status: Former Smoker    Packs/day: 0.50    Years: 30.00    Pack years: 15.00    Types: Cigarettes    Last attempt to quit: 11/08/2016    Years since quitting: 1.1  . Smokeless tobacco: Never Used  Substance Use Topics  . Alcohol use: No    Frequency: Never  . Drug use: No     Allergies   Metoprolol; Montelukast; Morphine sulfate; Penicillins; Prednisone; Diltiazem; and Gabapentin   Review of Systems Review of Systems  Constitutional: Negative for chills, diaphoresis, fatigue, fever and malaise/fatigue.  HENT: Negative for congestion and rhinorrhea.   Respiratory: Negative for cough, chest tightness and shortness of breath.   Cardiovascular: Positive for syncope. Negative for chest pain and palpitations.  Gastrointestinal: Negative for abdominal pain, nausea and vomiting.  Genitourinary: Negative for dysuria and flank pain.  Musculoskeletal: Negative for back pain, neck pain and neck stiffness.  Neurological: Positive for syncope and light-headedness. Negative for dizziness, weakness,  numbness and headaches.  Psychiatric/Behavioral: Negative for confusion.  All other systems reviewed and are negative.    Physical Exam Updated Vital Signs BP (!) 117/49   Pulse 99   Temp 97.9 F (  36.6 C) (Oral)   Resp 17   LMP 11/11/2011   SpO2 98%   Physical Exam  Constitutional: She is oriented to person, place, and time. She appears well-developed and well-nourished. No distress.  HENT:  Head: Normocephalic and atraumatic.  Right Ear: External ear normal.  Left Ear: External ear normal.  Nose: Nose normal.  Mouth/Throat: Oropharynx is clear and moist. No oropharyngeal exudate.  Eyes: Pupils are equal, round, and reactive to light. Conjunctivae and EOM are normal.  Neck: Normal range of motion. Neck supple.  Cardiovascular: Normal rate and intact distal pulses.  No murmur heard. Pulmonary/Chest: Effort normal. No stridor. No respiratory distress. She has wheezes. She exhibits no tenderness.  Abdominal: She exhibits no distension. There is no tenderness. There is no rebound.  Musculoskeletal: She exhibits no tenderness.  Neurological: She is alert and oriented to person, place, and time. She has normal reflexes. No sensory deficit. She exhibits normal muscle tone. Coordination normal.  Skin: Skin is warm. Capillary refill takes less than 2 seconds. No rash noted. She is not diaphoretic. No erythema.  Psychiatric: She has a normal mood and affect.  Nursing note and vitals reviewed.    ED Treatments / Results  Labs (all labs ordered are listed, but only abnormal results are displayed) Labs Reviewed  CBC WITH DIFFERENTIAL/PLATELET - Abnormal; Notable for the following components:      Result Value   WBC 12.7 (*)    RDW 17.4 (*)    Neutro Abs 9.8 (*)    All other components within normal limits  COMPREHENSIVE METABOLIC PANEL - Abnormal; Notable for the following components:   Chloride 88 (*)    CO2 34 (*)    Glucose, Bld 271 (*)    BUN 49 (*)    Creatinine, Ser 1.55  (*)    Total Protein 8.3 (*)    GFR calc non Af Amer 39 (*)    GFR calc Af Amer 45 (*)    All other components within normal limits  CBG MONITORING, ED - Abnormal; Notable for the following components:   Glucose-Capillary 282 (*)    All other components within normal limits  I-STAT BETA HCG BLOOD, ED ([facility], [facility], [facility] ONLY) - Abnormal; Notable for the following components:   I-stat hCG, quantitative 14.8 (*)    All other components within normal limits  HCG, QUANTITATIVE, PREGNANCY  I-STAT TROPONIN, ED    EKG None  ED ECG REPORT   Date: 12/26/2017  Rate: 100  Rhythm: normal sinus rhythm  QRS Axis: normal  Intervals: normal  ST/T Wave abnormalities: normal  Conduction Disutrbances:right bundle branch block and left anterior fascicular block  Narrative Interpretation:   Old EKG Reviewed: unchanged  I have personally reviewed the EKG tracing and agree with the computerized printout as noted.   Radiology No results found.  Procedures Procedures (including critical care time)  Medications Ordered in ED Medications - No data to display   Initial Impression / Assessment and Plan / ED Course  I have reviewed the triage vital signs and the nursing notes.  Pertinent labs & imaging results that were available during my care of the patient were reviewed by me and considered in my medical decision making (see chart for details).     [name] is a 47 y.o. female with a complicated past medical history significant for CAD, CHF, diabetes, stroke, hyper tension, hyperlipidemia, GERD, seizures, and bipolar disorder who presents from a weight loss class for syncopal episode.  Patient is coming by family.  Patient reports that today she was had a weight loss class when she began feeling lightheaded.  She says that she felt very hot and then passed out for several seconds.  She reports that this is happened in the past when her oxygen was low.  She says that the oxygen was not hooked up  correctly on her portable oxygen tank causing her to be hypoxic.  According to report from the transporting team to nursing, her oxygen saturations were in the 80s prior to being placed back on oxygen.  Patient denies any preceding chest pain, palpitations, diaphoresis, nausea, vomiting, urinary symptoms or GI symptoms.  She denies having shortness of breath, cough or congestion.  She simply reports that she passed out as she has in the past from low oxygen.    On my exam, patient's lungs had very faint wheezing but did not have any significant rhonchi or crackles.  Patient's chest and back were nontender.  Abdomen was nontender.  Patient had symmetric pulses in upper extremities.  Patient blood pressure was in the 90s however she reports that this is normal for her.    Shared decision making conversation held patient as to work-up needed in the emergency department.  As her pressures are soft, she has her extensive medical history, and she had a syncopal episode with hypoxia, she was offered laboratory testing, chest x-ray, and further work-up to rule out more concerning etiologies.  Patient is not interested in this at this time and would rather go home.  She reports that she always has this when her oxygen is low and due to the oxygen tank problem she feels this is the cause.  She is not interested in having imaging or other work-up today.  Patient discharged before her laboratory testing had been completed which was ordered in triage.    Patient will follow-up with her PCP and understands return precautions for a missed diagnosis.  Patient and her family had no other questions and agree with discharge.    Patient discharged in good condition for further outpatient management.   Final Clinical Impressions(s) / ED Diagnoses   Final diagnoses:  Syncope, unspecified syncope type  Hypoxia    ED Discharge Orders    None      Clinical Impression: 1. Syncope, unspecified syncope type   2.  Hypoxia     Disposition: Discharge  Condition: Good  I have discussed the results, Dx and Tx plan with the pt(& family if present). He/she/they expressed understanding and agree(s) with the plan. Discharge instructions discussed at great length. Strict return precautions discussed and pt &/or family have verbalized understanding of the instructions. No further questions at time of discharge.    New Prescriptions   No medications on file    Follow Up: Pleas Koch, NP [place] [id number] [place] DEPT [place] [id number] [place]       Kainalu Heggs, Gwenyth Allegra, MD 12/27/17 [phone]

## 2017-12-27 LAB — HCG, QUANTITATIVE, PREGNANCY: hCG, Beta Chain, Quant, S: 4 m[IU]/mL (ref <5)

## 2017-12-27 IMAGING — DX DG CHEST 2V
2 series · 2 of 2 positions shown · non-contrast
Comparison: 03/17/2017.  05/17/2016.  12/24/2014.

CLINICAL DATA: Shortness of breath.

EXAM:
CHEST  2 VIEW

[chest pa]
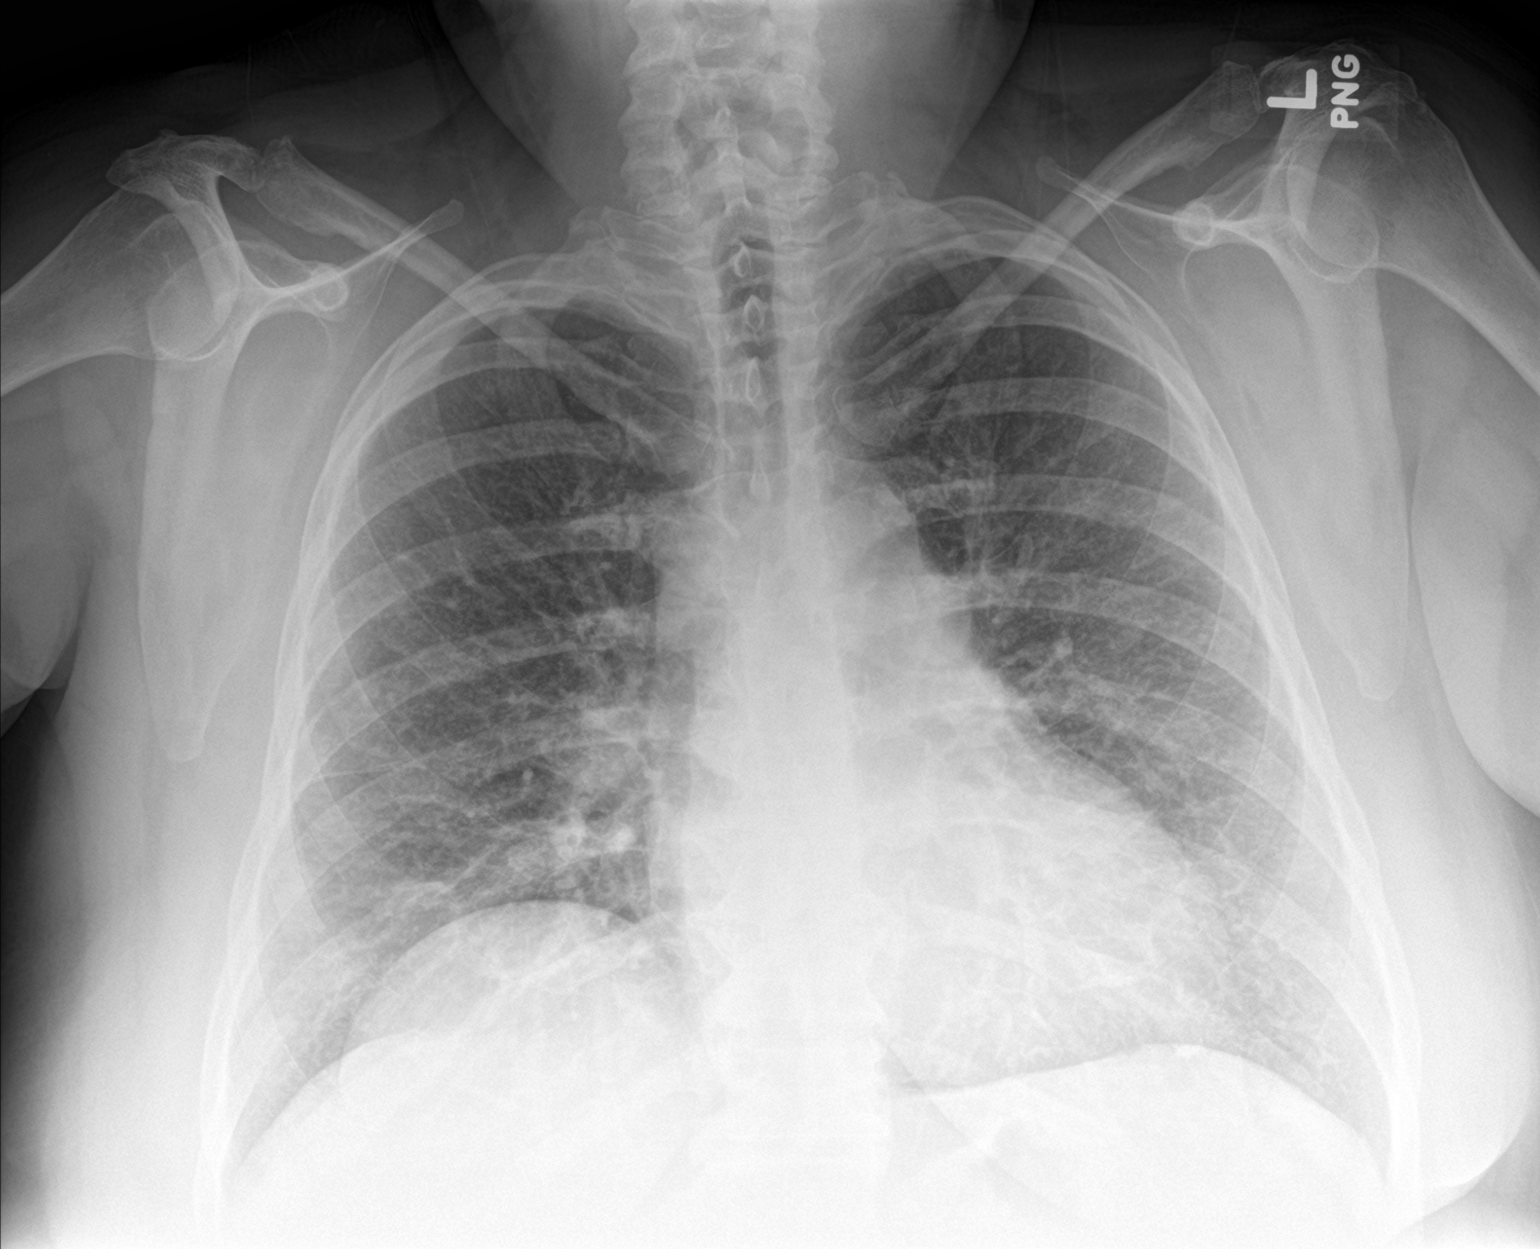

[chest lat]
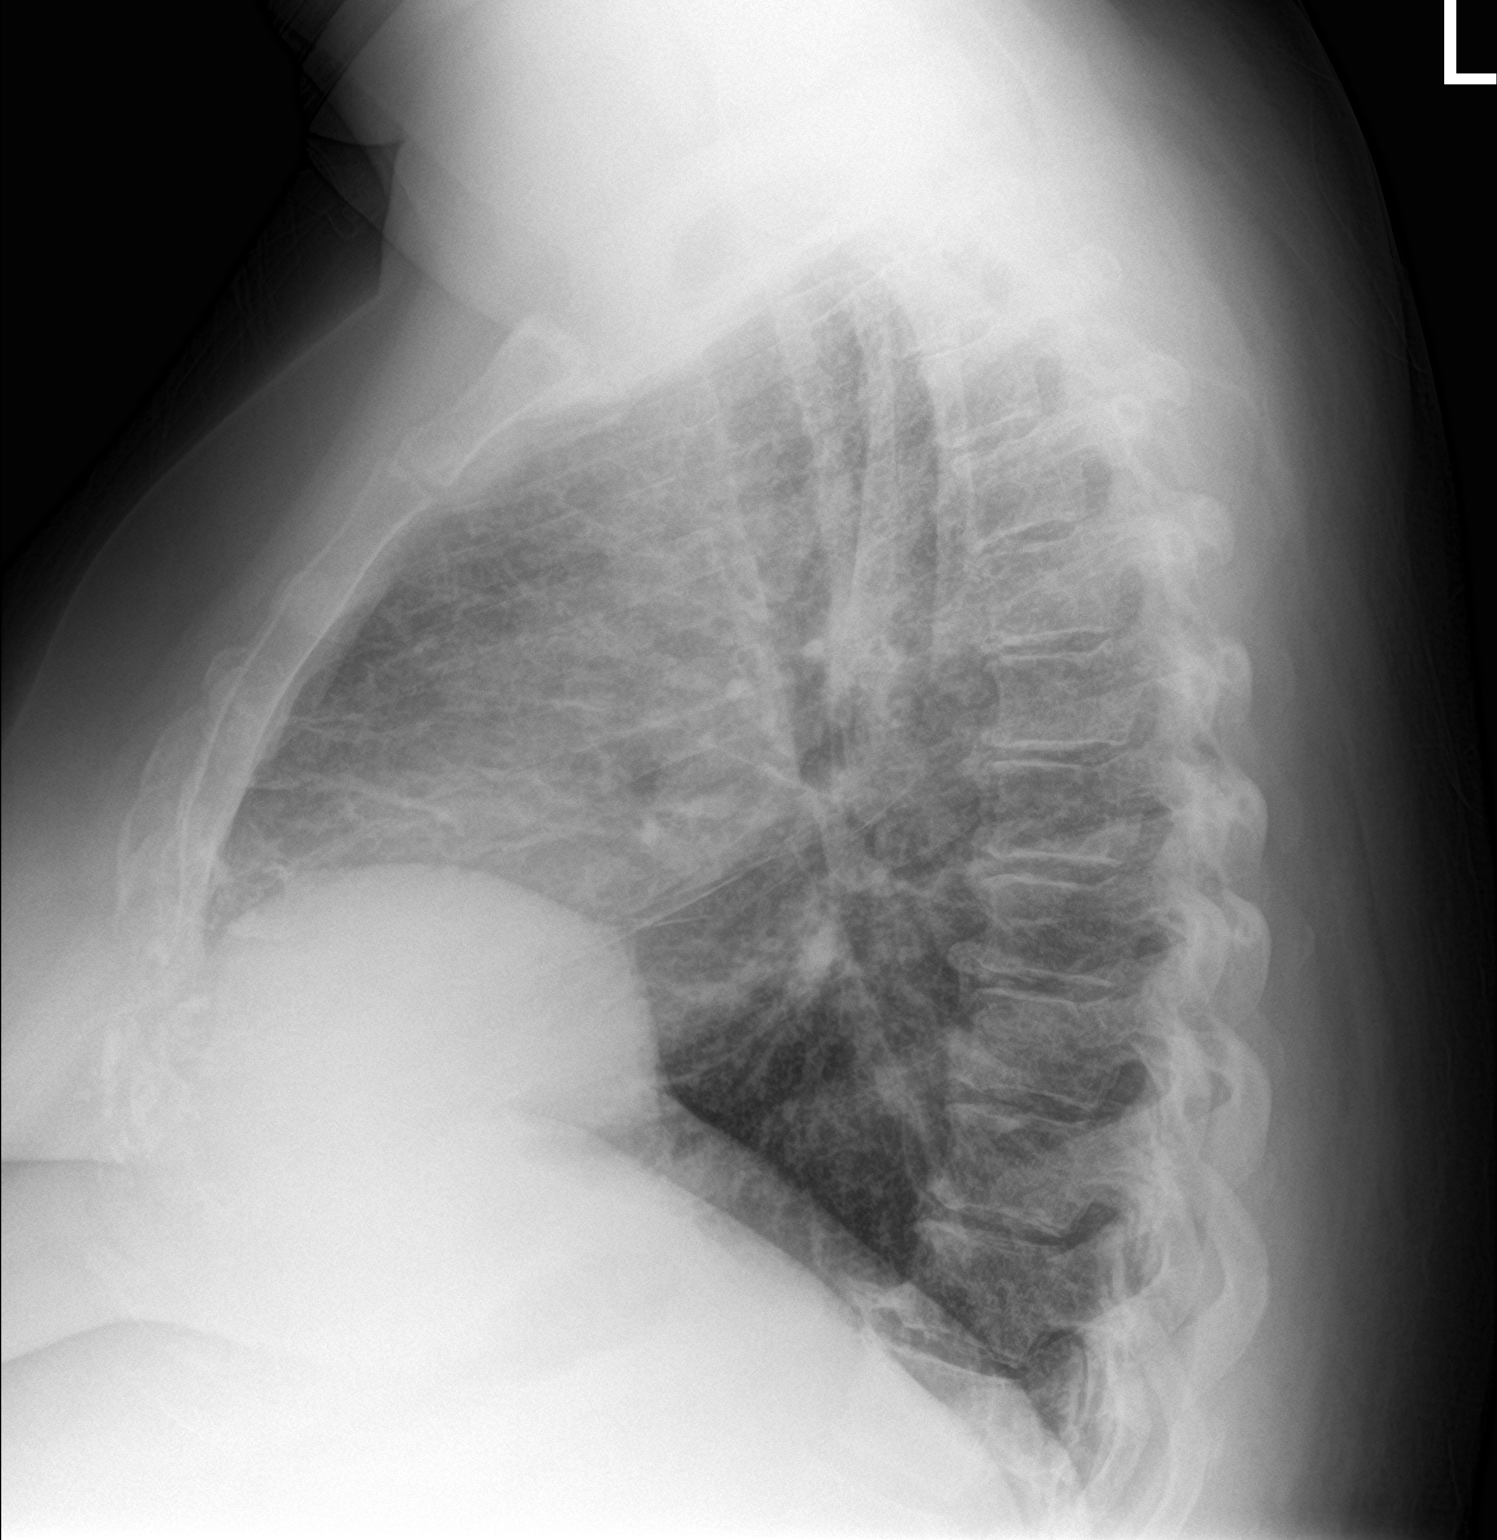

[2 of 2 positions shown; findings below may reference images not displayed]

FINDINGS: Mediastinum and hilar structures normal. Mild cardiomegaly with mild
pulmonary venous congestion. Mild bilateral interstitial prominence
noted. This is unchanged prior exams and consistent chronic
interstitial lung disease. No acute pulmonary abnormality . No
pleural effusion or pneumothorax. No acute bony abnormality.
IMPRESSION: 1. Cardiomegaly with mild pulmonary venous congestion.

2. Chronic interstitial lung disease .

## 2018-01-04 ENCOUNTER — Ambulatory Visit: Payer: BLUE CROSS/BLUE SHIELD | Attending: Internal Medicine

## 2018-01-04 DIAGNOSIS — J984 Other disorders of lung: Secondary | ICD-10-CM | POA: Insufficient documentation

## 2018-01-04 DIAGNOSIS — J9611 Chronic respiratory failure with hypoxia: Secondary | ICD-10-CM | POA: Diagnosis not present

## 2018-01-04 DIAGNOSIS — G4736 Sleep related hypoventilation in conditions classified elsewhere: Secondary | ICD-10-CM | POA: Diagnosis not present

## 2018-01-04 DIAGNOSIS — G4733 Obstructive sleep apnea (adult) (pediatric): Secondary | ICD-10-CM | POA: Diagnosis not present

## 2018-01-05 DIAGNOSIS — G4733 Obstructive sleep apnea (adult) (pediatric): Secondary | ICD-10-CM | POA: Diagnosis not present

## 2018-01-06 IMAGING — US US ABDOMEN LIMITED
1 series · 8 of 8 positions shown · non-contrast
Comparison: 12/16/2015.

CLINICAL DATA: 46-year-old female with possible ascites. Initial
encounter.

EXAM:
LIMITED ABDOMEN ULTRASOUND FOR ASCITES
TECHNIQUE: Limited ultrasound survey for ascites was performed in all four
abdominal quadrants.

[Series 1: us abdomen limited · 0.38mm/px · 8 of 8 slices shown]
[im 1/8]
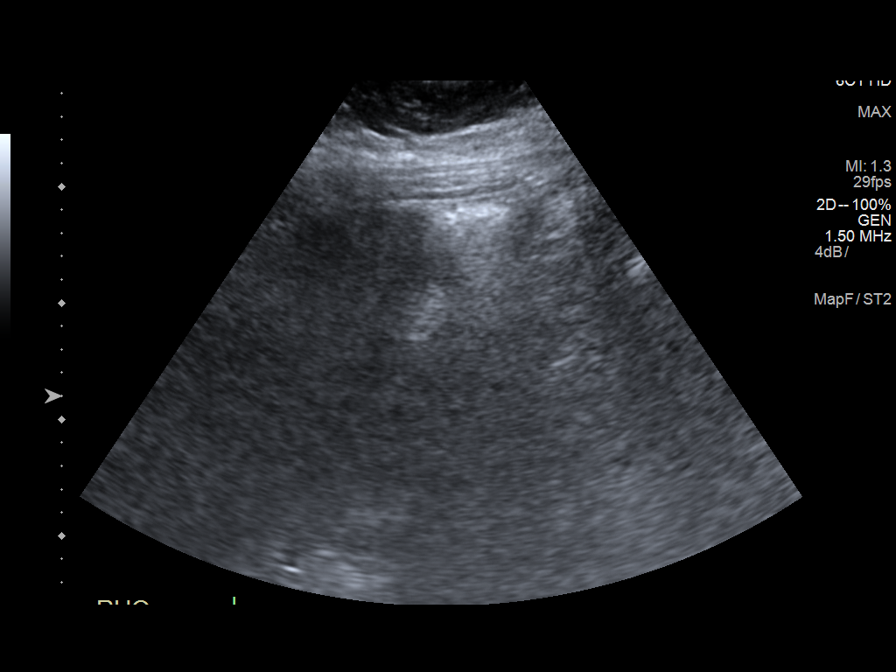
[im 2/8]
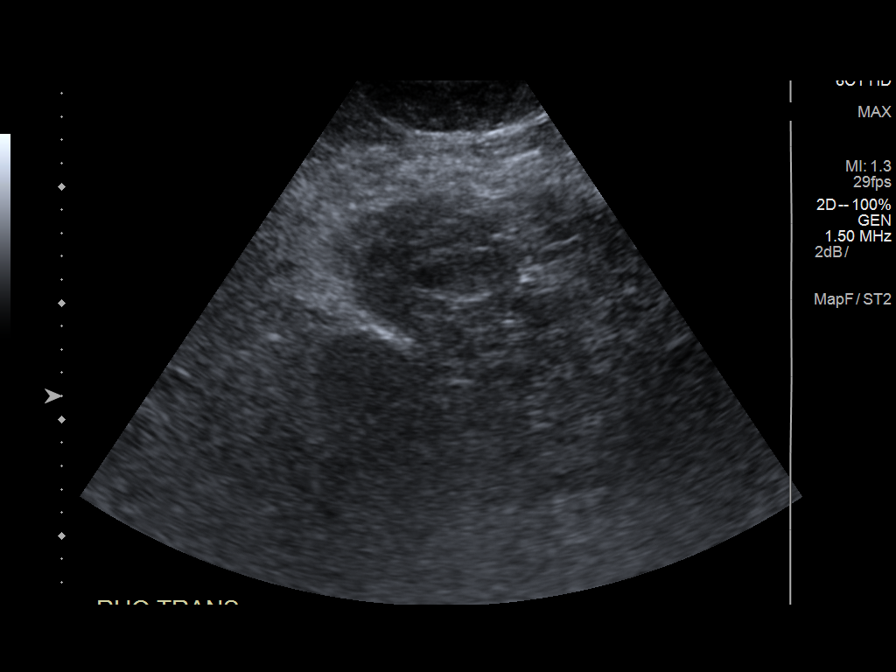
[im 3/8]
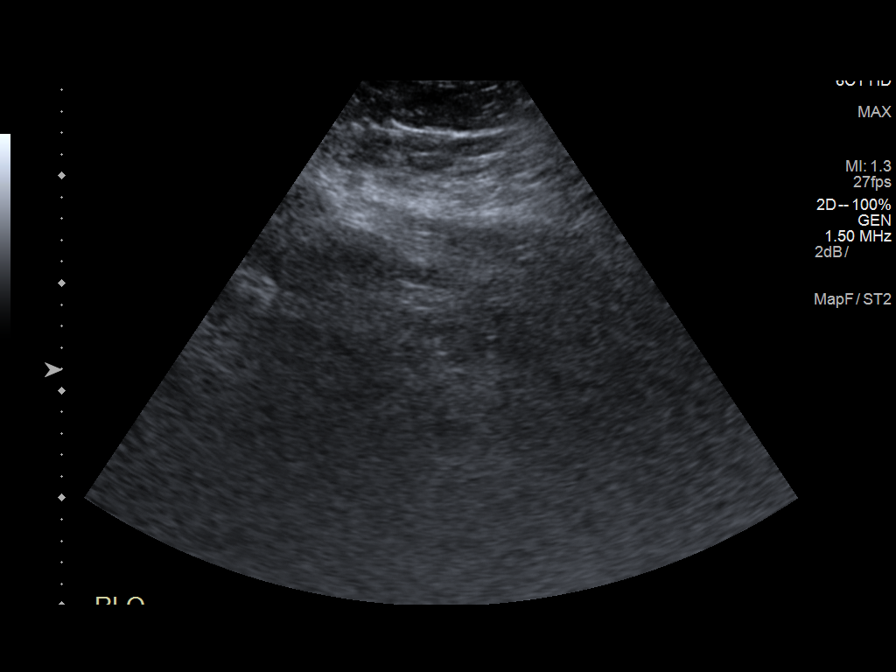
[im 4/8]
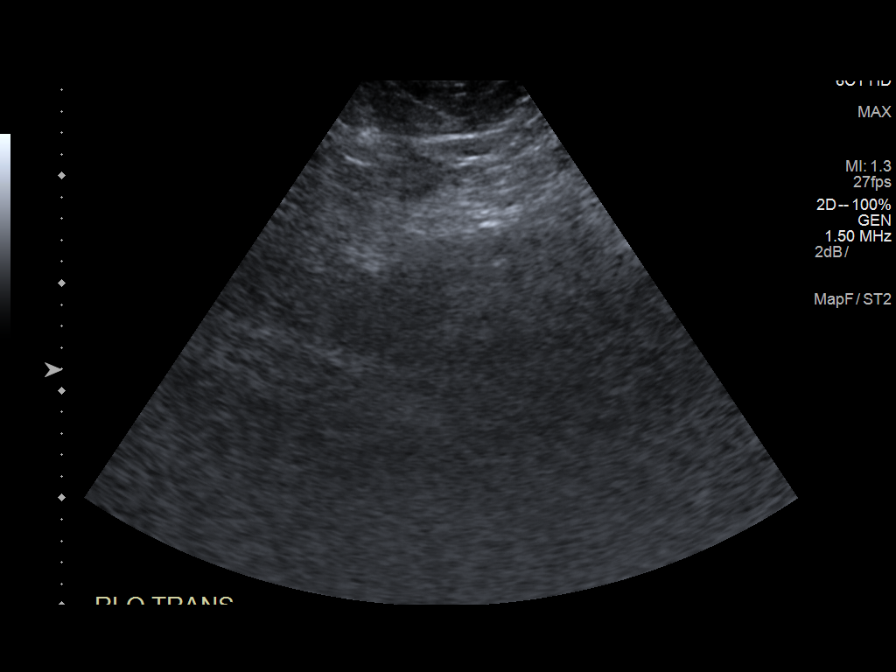
[im 5/8]
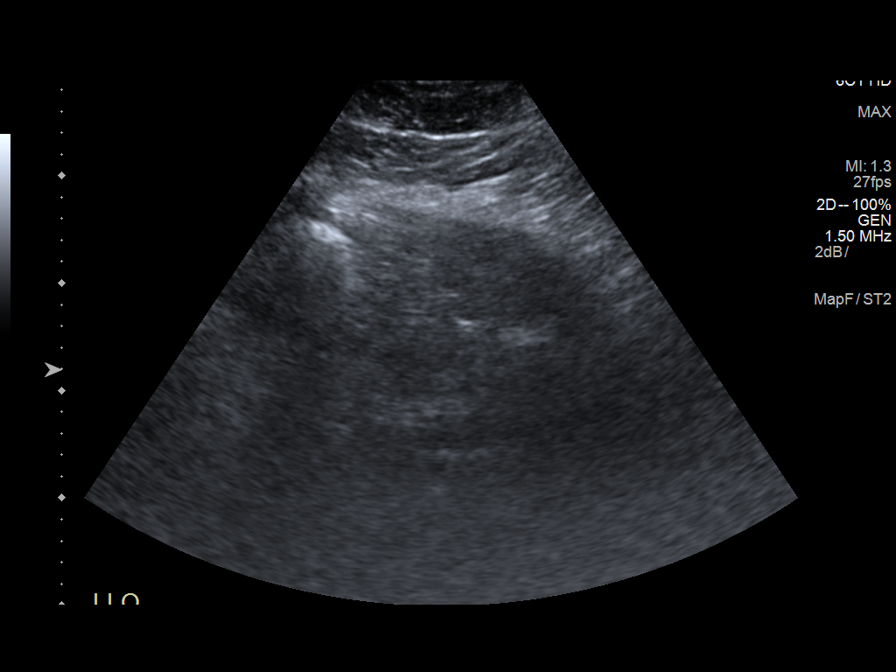
[im 6/8]
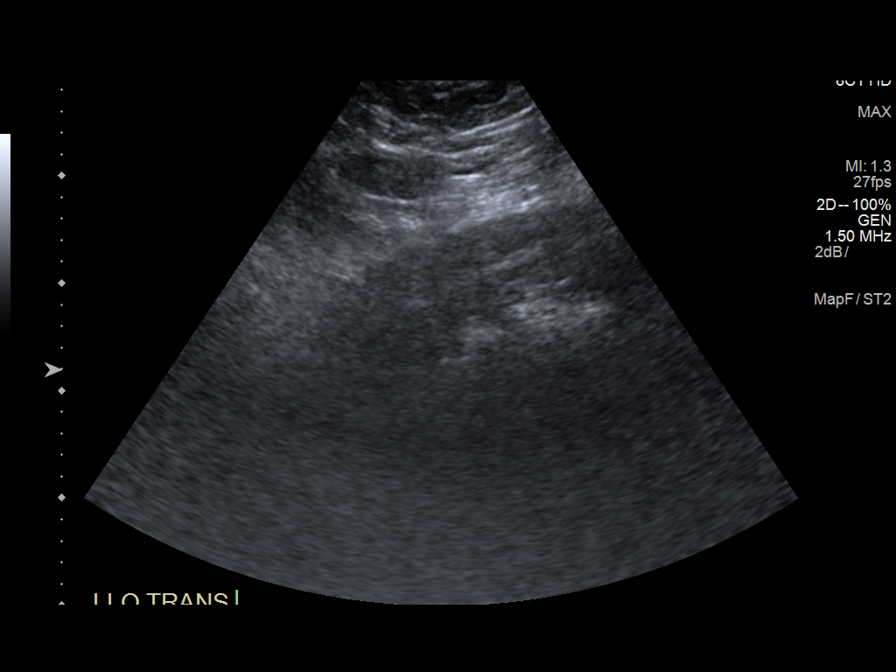
[im 7/8]
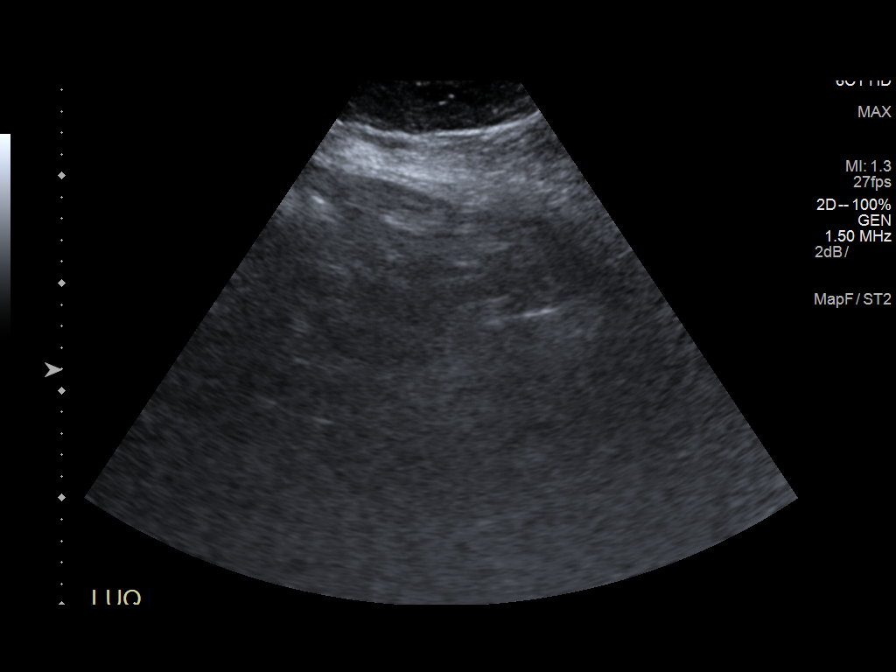
[im 8/8]
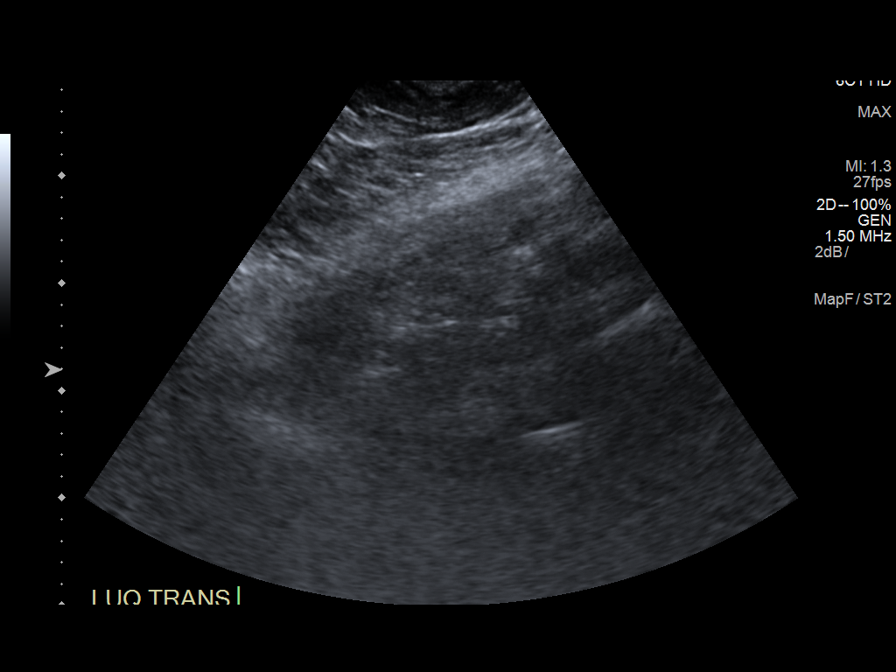

[8 of 8 positions shown; findings below may reference images not displayed]

FINDINGS: Four quadrant examination.  No ascites visualized.
IMPRESSION: No ascites visualized.

## 2018-01-08 ENCOUNTER — Other Ambulatory Visit: Payer: Self-pay

## 2018-01-08 ENCOUNTER — Ambulatory Visit: Payer: BLUE CROSS/BLUE SHIELD | Attending: Nurse Practitioner | Admitting: Nurse Practitioner

## 2018-01-08 ENCOUNTER — Encounter: Payer: Self-pay | Admitting: Nurse Practitioner

## 2018-01-08 DIAGNOSIS — M79602 Pain in left arm: Secondary | ICD-10-CM

## 2018-01-08 DIAGNOSIS — G8929 Other chronic pain: Secondary | ICD-10-CM | POA: Diagnosis not present

## 2018-01-08 DIAGNOSIS — K219 Gastro-esophageal reflux disease without esophagitis: Secondary | ICD-10-CM | POA: Diagnosis not present

## 2018-01-08 DIAGNOSIS — Z789 Other specified health status: Secondary | ICD-10-CM | POA: Diagnosis not present

## 2018-01-08 DIAGNOSIS — E1122 Type 2 diabetes mellitus with diabetic chronic kidney disease: Secondary | ICD-10-CM | POA: Diagnosis not present

## 2018-01-08 DIAGNOSIS — M79605 Pain in left leg: Secondary | ICD-10-CM

## 2018-01-08 DIAGNOSIS — I2510 Atherosclerotic heart disease of native coronary artery without angina pectoris: Secondary | ICD-10-CM | POA: Insufficient documentation

## 2018-01-08 DIAGNOSIS — Z79891 Long term (current) use of opiate analgesic: Secondary | ICD-10-CM

## 2018-01-08 DIAGNOSIS — N182 Chronic kidney disease, stage 2 (mild): Secondary | ICD-10-CM | POA: Insufficient documentation

## 2018-01-08 DIAGNOSIS — Z79899 Other long term (current) drug therapy: Secondary | ICD-10-CM

## 2018-01-08 DIAGNOSIS — G894 Chronic pain syndrome: Secondary | ICD-10-CM | POA: Diagnosis not present

## 2018-01-08 DIAGNOSIS — J449 Chronic obstructive pulmonary disease, unspecified: Secondary | ICD-10-CM | POA: Diagnosis not present

## 2018-01-08 DIAGNOSIS — M5442 Lumbago with sciatica, left side: Secondary | ICD-10-CM | POA: Diagnosis not present

## 2018-01-08 DIAGNOSIS — M899 Disorder of bone, unspecified: Secondary | ICD-10-CM

## 2018-01-08 DIAGNOSIS — G2581 Restless legs syndrome: Secondary | ICD-10-CM | POA: Insufficient documentation

## 2018-01-08 DIAGNOSIS — M79604 Pain in right leg: Secondary | ICD-10-CM | POA: Diagnosis not present

## 2018-01-08 DIAGNOSIS — I130 Hypertensive heart and chronic kidney disease with heart failure and stage 1 through stage 4 chronic kidney disease, or unspecified chronic kidney disease: Secondary | ICD-10-CM | POA: Diagnosis not present

## 2018-01-08 DIAGNOSIS — M5441 Lumbago with sciatica, right side: Secondary | ICD-10-CM | POA: Diagnosis not present

## 2018-01-08 DIAGNOSIS — I5032 Chronic diastolic (congestive) heart failure: Secondary | ICD-10-CM | POA: Insufficient documentation

## 2018-01-08 DIAGNOSIS — M79601 Pain in right arm: Secondary | ICD-10-CM

## 2018-01-08 HISTORY — DX: Long term (current) use of opiate analgesic: Z79.891

## 2018-01-08 HISTORY — DX: Other chronic pain: G89.29

## 2018-01-08 NOTE — Progress Notes (Signed)
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 (transient ischemic attack), and Tobacco abuse. Family: family history includes Asthma in her father and sister; Coronary artery disease in her mother; Diabetes in her father and mother; Diabetes type II in her brother; Hypertension in her mother; Other in her brother; Venous thrombosis in her brother.  Past Surgical History:  Procedure Laterality Date  . HERNIA REPAIR    . INGUINAL HERNIA REPAIR Left 04/08/2015   Procedure: OPEN LEFT INGUINAL HERNIA REPAIR WITH MESH;  Surgeon: Ralene Ok, MD;  Location: [facility];  Service: General;  Laterality: Left;  . INSERTION OF MESH Left 04/08/2015   Procedure: INSERTION OF MESH;  Surgeon: Ralene Ok, MD;  Location: [facility];  Service: General;  Laterality: Left;  . LAPAROSCOPY     Endometriosis  . LEFT HEART CATHETERIZATION WITH CORONARY ANGIOGRAM N/A 12/05/2013   Procedure: LEFT HEART CATHETERIZATION WITH CORONARY ANGIOGRAM;  Surgeon: Jettie Booze, MD;  Location: [facility] CATH LAB;  Service: Cardiovascular;  Laterality: N/A;  . right kidney drained    . TEE WITHOUT CARDIOVERSION N/A 11/28/2013   Procedure: TRANSESOPHAGEAL ECHOCARDIOGRAM (TEE);  Surgeon: Thayer Headings, MD;  Location: [facility] ENDOSCOPY;  Service:  Cardiovascular;  Laterality: N/A;   Active Ambulatory Problems    Diagnosis Date Noted  . Diabetic polyneuropathy ([place]) 06/08/2010  . HYPERTRIGLYCERIDEMIA 07/17/2007  . Morbid (severe) obesity due to excess calories ([place]) 04/23/2007  . Tobacco abuse 09/07/2006  . COPD GOLD 0  05/30/2007  . Asthma, persistent 09/07/2006  . Constipation 06/15/2011  . TIA (transient ischemic attack) 06/22/2011  . Sinus tachycardia 10/09/2011  . Seizure disorder ([place]) 10/09/2011  . Bipolar disorder ([place]) 10/09/2011  . Sleep apnea 05/22/2012  . Numbness and tingling 04/01/2013  . Aortic valve disorders 04/18/2013  . Allergic rhinitis with postnasal drip 01/21/2014  . Inguinal hernia 11/27/2014  . Essential hypertension 01/02/2015  . Preventative health care 02/04/2015  . Venous stasis of both lower extremities 03/29/2015  . COPD exacerbation ([place]) 10/15/2015  . Fracture of rib, closed 10/15/2015  . Orthopnea 11/03/2015  . Dyspnea   . Bilateral leg edema   . Diabetes ([place])   . Decreased urine stream   . Acute on chronic diastolic (congestive) heart failure ([place]) 11/04/2015  . Coronary artery disease involving native heart without angina pectoris 05/16/2016  . Simple chronic bronchitis ([place])   . Cigarette smoker 06/15/2016  . Upper airway cough syndrome 08/22/2016  . Chronic respiratory failure with hypoxia (HCC)/ nocturnal 02 dep  10/28/2016  . Vision disturbance 02/28/2017  .  CKD (chronic kidney disease), stage II 02/28/2017  . Visual loss, bilateral   . Nocturnal hypoxemia 03/29/2017  . Restless leg syndrome 08/02/2017  . Chronic pain of both lower extremities (Primary Area of Pain) 01/08/2018  . Chronic bilateral low back pain with bilateral sciatica [facility]) (midline) 01/08/2018  . Chronic pain syndrome 01/08/2018  . Chronic upper extremity pain (Secondary Area of Pain) 01/08/2018  . Pharmacologic therapy 01/08/2018  . Disorder of skeletal system 01/08/2018  . Problems  influencing health status 01/08/2018  . Long term current use of opiate analgesic 01/08/2018   Resolved Ambulatory Problems    Diagnosis Date Noted  . Depression with anxiety 05/30/2007  . ADJUSTMENT DISORDER 11/11/2008  . Esophageal reflux 01/29/2009  . UNSPECIFIED DISORDER TEETH&SUPPORTING STRUCTURES 08/16/2010  . URI (upper respiratory infection) 06/15/2011  . Left-sided weakness 06/22/2011  . Chest pain 06/22/2011  . Seizures ([place])   . Syncope 10/09/2011  . Tobacco dependence 10/09/2011  . Elevated BP 05/29/2012  . Foot swelling 12/11/2012  . Atypical chest pain 04/01/2013  . Abnormal sensation of lower extremity 04/01/2013  . Yeast vaginitis 10/15/2013  . Leg swelling 11/04/2013  . Dyspnea 11/04/2013  . Medication side effect 01/21/2014  . Pleuritic pain 08/22/2014  . COPD exacerbation ([place]) 12/24/2014  . Somnolence    Past Medical History:  Diagnosis Date  . Anxiety   . Anxiety   . Aortic valve calcification   . Asthma   . Asthma   . Bipolar disorder ([place])   . CAD (coronary artery disease)   . CHF (congestive heart failure) ([place])   . Chronic diastolic CHF (congestive heart failure) ([place])   . CKD (chronic kidney disease), stage II   . COPD (chronic obstructive pulmonary disease) ([place])   . COPD (chronic obstructive pulmonary disease) ([place])   . Coronary artery disease   . Diabetes mellitus   . Diabetes mellitus without complication ([place])   . Dyspnea   . Family history of adverse reaction to anesthesia   . GERD (gastroesophageal reflux disease)   . History of blood clots   . Hyperlipidemia   . Hypertension   . Hypertriglyceridemia   . Inguinal hernia, left 01/2015  . Muscle spasm   . Peripheral neuropathy   . RBBB   . Seizures ([place])   . Sinus tachycardia   . Smokers' cough ([place])   . Stroke ([place]) 1989  . TIA (transient ischemic attack)   . Tobacco abuse    Constitutional Exam  General appearance: Well nourished, well developed, and well hydrated. In no  apparent acute distress Vitals:   01/08/18 1001  BP: (!) 114/55  Pulse: 93  Temp: 97.9 F (36.6 C)  SpO2: 98%  Weight: 290 lb (131.5 kg)  Height: _0  (1.626 m)   BMI Assessment: Estimated body mass index is 49.78 kg/m as calculated from the following:   Height as of this encounter: _1  (1.626 m).   Weight as of this encounter: 290 lb (131.5 kg).  BMI interpretation table: BMI level Category Range association with higher incidence of chronic pain  <18 kg/m2 Underweight   18.5-24.9 kg/m2 Ideal body weight   25-29.9 kg/m2 Overweight Increased incidence by 20%  30-34.9 kg/m2 Obese (Class I) Increased incidence by 68%  35-39.9 kg/m2 Severe obesity (Class II) Increased incidence by 136%  >40 kg/m2 Extreme obesity (Class III) Increased incidence by 254%   BMI Readings from Last 4 Encounters:  01/08/18 49.78 kg/m  12/21/17 45.52 kg/m  12/13/17  46.28 kg/m  12/06/17 46.52 kg/m   Wt Readings from Last 4 Encounters:  01/08/18 290 lb (131.5 kg)  12/21/17 282 lb (127.9 kg)  12/13/17 286 lb 12 oz (130.1 kg)  12/06/17 288 lb 3.2 oz (130.7 kg)  Psych/Mental status: Alert, oriented x 3 (person, place, & time)       Eyes: PERLA Respiratory: No evidence of acute respiratory distress  Cervical Spine Exam  Inspection: No masses, redness, or swelling Alignment: Symmetrical Functional ROM: Unrestricted ROM      Stability: No instability detected Muscle strength & Tone: Functionally intact Sensory: Unimpaired Palpation: No palpable anomalies              Upper Extremity (UE) Exam    Side: Right upper extremity  Side: Left upper extremity  Inspection: No masses, redness, swelling, or asymmetry. No contractures  Inspection: No masses, redness, swelling, or asymmetry. No contractures  Functional ROM: Unrestricted ROM          Functional ROM: Unrestricted ROM          Muscle strength & Tone: Functionally intact  Muscle strength & Tone: Functionally intact  Sensory: Unimpaired   Sensory: Unimpaired  Palpation: No palpable anomalies              Palpation: No palpable anomalies              Specialized Test(s): Deferred         Specialized Test(s): Deferred          Thoracic Spine Exam  Inspection: No masses, redness, or swelling Alignment: Symmetrical Functional ROM: Unrestricted ROM Stability: No instability detected Sensory: Unimpaired Muscle strength & Tone: No palpable anomalies  Lumbar Spine Exam  Inspection: No masses, redness, or swelling Alignment: Symmetrical Functional ROM: Unrestricted ROM      Stability: No instability detected Muscle strength & Tone: Functionally intact Sensory: Unimpaired Palpation: Complains of area being tender to palpation       Provocative Tests: Lumbar Hyperextension and rotation test: Unable to perform       Patrick's Maneuver: Negative                    Gait & Posture Assessment  Ambulation: Unassisted Gait: Awkward Posture: WNL   Lower Extremity Exam    Side: Right lower extremity  Side: Left lower extremity  Inspection: No masses, redness, swelling, or asymmetry. No contractures  Inspection: No masses, redness, swelling, or asymmetry. No contractures  Functional ROM: Limited ROM          Functional ROM: Limited ROM          Muscle strength & Tone: Functionally intact  Muscle strength & Tone: Functionally intact  Sensory: Unimpaired  Sensory: Unimpaired  Palpation: No palpable anomalies  Palpation: No palpable anomalies   Assessment  Primary Diagnosis & Pertinent Problem List: Diagnoses of Chronic pain of both lower extremities, Chronic bilateral low back pain with bilateral sciatica, Chronic pain syndrome, Chronic pain of both upper extremities, Pharmacologic therapy, Disorder of skeletal system, Problems influencing health status, and Long term current use of opiate analgesic were pertinent to this visit.  Visit Diagnosis: 1. Chronic pain of both lower extremities   2. Chronic bilateral low back pain with  bilateral sciatica   3. Chronic pain syndrome   4. Chronic pain of both upper extremities   5. Pharmacologic therapy   6. Disorder of skeletal system   7. Problems influencing health status   8. Long term current use  of opiate analgesic    Plan of Care  Initial treatment plan:  Please be advised that as per protocol, today's visit has been an evaluation only. We have not taken over the patient's controlled substance management.  Problem-specific plan: No problem-specific Assessment & Plan notes found for this encounter.  Ordered Lab-work, Procedure(s), Referral(s), & Consult(s): Orders Placed This Encounter  Procedures  . DG Lumbar Spine Complete W/Bend  . Compliance Drug Analysis, Ur  . Comp. Metabolic Panel (12)  . Magnesium  . Vitamin B12  . Sedimentation rate  . 25-Hydroxyvitamin D Lcms D2+D3  . C-reactive protein   Pharmacotherapy: Medications ordered:  No orders of the defined types were placed in this encounter.  Medications administered during this visit: [name] had no medications administered during this visit.   Pharmacotherapy under consideration:  Opioid Analgesics: The patient was informed that there is no guarantee that she would be a candidate for opioid analgesics. The decision will be made following CDC guidelines. This decision will be based on the results of diagnostic studies, as well as [name]'s risk profile.  Membrane stabilizer: To be determined at a later time Muscle relaxant: To be determined at a later time NSAID: To be determined at a later time Other analgesic(s): To be determined at a later time   Interventional therapies under consideration: [name] was informed that there is no guarantee that she would be a candidate for interventional therapies. The decision will be based on the results of diagnostic studies, as well as [name]'s risk profile.  Possible procedure(s): Diagnostic bilateral sympathetic nerve blocks Possible   bilateral sympathetic nerve RFA Diagnostic bilateral lumbar epidural steroid injection Diagnostic bilateral lumbar facet nerve block Possible bilateral lumbar facet RFA   Provider-requested follow-up: Return for 2nd Visit, w/ Dr. Dossie Arbour, [place] before return for second visit.  Future Appointments  Date Time Provider [place]  01/29/2018  8:30 AM Milinda Pointer, MD [facility] None  02/06/2018  9:30 AM Renato Shin, MD [facility] None  03/22/2018  8:30 AM [facility] None  03/22/2018  9:30 AM [facility] None  06/15/2018  8:20 AM Pleas Koch, NP [facility]    Primary Care Physician: Pleas Koch, NP Location: [facility] Note by:  Date: 01/08/2018; Time: 4:53 PM  Pain Score Disclaimer: We use the NRS-11 scale. This is a self-reported, subjective measurement of pain severity with only modest accuracy. It is used primarily to identify changes within a particular patient. It must be understood that outpatient pain scales are significantly less accurate that those used for research, where they can be applied under ideal controlled circumstances with minimal exposure to variables. In reality, the score is likely to be a combination of pain intensity and pain affect, where pain affect describes the degree of emotional arousal or changes in action readiness caused by the sensory experience of pain. Factors such as social and work situation, setting, emotional state, anxiety levels, expectation, and prior pain experience may influence pain perception and show large inter-individual differences that may also be affected by time variables.  Patient instructions provided during this appointment: Patient Instructions   ____________________________________________________________________________________________  Appointment Policy Summary  It is our goal and responsibility to provide the medical community with assistance in the evaluation  and management of patients with chronic pain. Unfortunately our resources are limited. Because we do not have an unlimited amount of time, or available appointments, we are required to closely monitor and manage their use. The  following rules exist to maximize their use:  Patient's responsibilities: 1. Punctuality:  At what time should I arrive? You should be physically present in our office 30 minutes before your scheduled appointment. Your scheduled appointment is with your assigned healthcare provider. However, it takes 5-10 minutes to be "checked-in", and another 15 minutes for the nurses to do the admission. If you arrive to our office at the time you were given for your appointment, you will end up being at least 20-25 minutes late to your appointment with the provider. 2. Tardiness:  What happens if I arrive only a few minutes after my scheduled appointment time? You will need to reschedule your appointment. The cutoff is your appointment time. This is why it is so important that you arrive at least 30 minutes before that appointment. If you have an appointment scheduled for 10:00 AM and you arrive at 10:01, you will be required to reschedule your appointment.  3. Plan ahead:  Always assume that you will encounter traffic on your way in. Plan for it. If you are dependent on a driver, make sure they understand these rules and the need to arrive early. 4. Other appointments and responsibilities:  Avoid scheduling any other appointments before or after your pain clinic appointments.  5. Be prepared:  Write down everything that you need to discuss with your healthcare provider and give this information to the admitting nurse. Write down the medications that you will need refilled. Bring your pills and bottles (even the empty ones), to all of your appointments, except for those where a procedure is scheduled. 6. No children or pets:  Find someone to take care of them. It is not appropriate to bring  them in. 7. Scheduling changes:  We request "advanced notification" of any changes or cancellations. 8. Advanced notification:  Defined as a time period of more than 24 hours prior to the originally scheduled appointment. This allows for the appointment to be offered to other patients. 9. Rescheduling:  When a visit is rescheduled, it will require the cancellation of the original appointment. For this reason they both fall within the category of "Cancellations".  10. Cancellations:  They require advanced notification. Any cancellation less than 24 hours before the  appointment will be recorded as a "No Show". 11. No Show:  Defined as an unkept appointment where the patient failed to notify or declare to the practice their intention or inability to keep the appointment.  Corrective process for repeat offenders:  1. Tardiness: Three (3) episodes of rescheduling due to late arrivals will be recorded as one (1) "No Show". 2. Cancellation or reschedule: Three (3) cancellations or rescheduling will be recorded as one (1) "No Show". 3. "No Shows": Three (3) "No Shows" within a 12 month period will result in discharge from the practice. ____________________________________________________________________________________________  ____________________________________________________________________________________________  Pain Scale  Introduction: The pain score used by this practice is the Verbal Numerical Rating Scale (VNRS-11). This is an 11-point scale. It is for adults and children 10 years or older. There are significant differences in how the pain score is reported, used, and applied. Forget everything you learned in the past and learn this scoring system.  General Information: The scale should reflect your current level of pain. Unless you are specifically asked for the level of your worst pain, or your average pain. If you are asked for one of these two, then it should be understood that it  is over the past 24 hours.  Basic Activities of  Daily Living (ADL): Personal hygiene, dressing, eating, transferring, and using restroom.  Instructions: Most patients tend to report their level of pain as a combination of two factors, their physical pain and their psychosocial pain. This last one is also known as "suffering" and it is reflection of how physical pain affects you socially and psychologically. From now on, report them separately. From this point on, when asked to report your pain level, report only your physical pain. Use the following table for reference.  Pain Clinic Pain Levels (0-5/10)  Pain Level Score  Description  No Pain 0   Mild pain 1 Nagging, annoying, but does not interfere with basic activities of daily living (ADL). Patients are able to eat, bathe, get dressed, toileting (being able to get on and off the toilet and perform personal hygiene functions), transfer (move in and out of bed or a chair without assistance), and maintain continence (able to control bladder and bowel functions). Blood pressure and heart rate are unaffected. A normal heart rate for a healthy adult ranges from 60 to 100 bpm (beats per minute).   Mild to moderate pain 2 Noticeable and distracting. Impossible to hide from other people. More frequent flare-ups. Still possible to adapt and function close to normal. It can be very annoying and may have occasional stronger flare-ups. With discipline, patients may get used to it and adapt.   Moderate pain 3 Interferes significantly with activities of daily living (ADL). It becomes difficult to feed, bathe, get dressed, get on and off the toilet or to perform personal hygiene functions. Difficult to get in and out of bed or a chair without assistance. Very distracting. With effort, it can be ignored when deeply involved in activities.   Moderately severe pain 4 Impossible to ignore for more than a few minutes. With effort, patients may still be able to manage  work or participate in some social activities. Very difficult to concentrate. Signs of autonomic nervous system discharge are evident: dilated pupils (mydriasis); mild sweating (diaphoresis); sleep interference. Heart rate becomes elevated (>115 bpm). Diastolic blood pressure (lower number) rises above 100 mmHg. Patients find relief in laying down and not moving.   Severe pain 5 Intense and extremely unpleasant. Associated with frowning face and frequent crying. Pain overwhelms the senses.  Ability to do any activity or maintain social relationships becomes significantly limited. Conversation becomes difficult. Pacing back and forth is common, as getting into a comfortable position is nearly impossible. Pain wakes you up from deep sleep. Physical signs will be obvious: pupillary dilation; increased sweating; goosebumps; brisk reflexes; cold, clammy hands and feet; nausea, vomiting or dry heaves; loss of appetite; significant sleep disturbance with inability to fall asleep or to remain asleep. When persistent, significant weight loss is observed due to the complete loss of appetite and sleep deprivation.  Blood pressure and heart rate becomes significantly elevated. Caution: If elevated blood pressure triggers a pounding headache associated with blurred vision, then the patient should immediately seek attention at an urgent or emergency care unit, as these may be signs of an impending stroke.    Emergency Department Pain Levels (6-10/10)  Emergency Room Pain 6 Severely limiting. Requires emergency care and should not be seen or managed at an outpatient pain management facility. Communication becomes difficult and requires great effort. Assistance to reach the emergency department may be required. Facial flushing and profuse sweating along with potentially dangerous increases in heart rate and blood pressure will be evident.   Distressing pain 7 Self-care  is very difficult. Assistance is required to  transport, or use restroom. Assistance to reach the emergency department will be required. Tasks requiring coordination, such as bathing and getting dressed become very difficult.   Disabling pain 8 Self-care is no longer possible. At this level, pain is disabling. The individual is unable to do even the most "basic" activities such as walking, eating, bathing, dressing, transferring to a bed, or toileting. Fine motor skills are lost. It is difficult to think clearly.   Incapacitating pain 9 Pain becomes incapacitating. Thought processing is no longer possible. Difficult to remember your own name. Control of movement and coordination are lost.   The worst pain imaginable 10 At this level, most patients pass out from pain. When this level is reached, collapse of the autonomic nervous system occurs, leading to a sudden drop in blood pressure and heart rate. This in turn results in a temporary and dramatic drop in blood flow to the brain, leading to a loss of consciousness. Fainting is one of the body's self defense mechanisms. Passing out puts the brain in a calmed state and causes it to shut down for a while, in order to begin the healing process.    Summary: 1. Refer to this scale when providing [place] with your pain level. 2. Be accurate and careful when reporting your pain level. This will help with your care. 3. Over-reporting your pain level will lead to loss of credibility. 4. Even a level of 1/10 means that there is pain and will be treated at our facility. 5. High, inaccurate reporting will be documented as "Symptom Exaggeration", leading to loss of credibility and suspicions of possible secondary gains such as obtaining more narcotics, or wanting to appear disabled, for fraudulent reasons. 6. Only pain levels of 5 or below will be seen at our facility. 7. Pain levels of 6 and above will be sent to the Emergency Department and the appointment  cancelled. ____________________________________________________________________________________________

## 2018-01-08 NOTE — Patient Instructions (Signed)

## 2018-01-08 NOTE — Progress Notes (Signed)
Safety precautions to be maintained throughout the outpatient stay will include: orient to surroundings, keep bed in low position, maintain call bell within reach at all times, provide assistance with transfer out of bed and ambulation.  

## 2018-01-09 ENCOUNTER — Other Ambulatory Visit: Payer: Self-pay | Admitting: Primary Care

## 2018-01-09 ENCOUNTER — Telehealth: Payer: Self-pay | Admitting: *Deleted

## 2018-01-09 DIAGNOSIS — G4733 Obstructive sleep apnea (adult) (pediatric): Secondary | ICD-10-CM

## 2018-01-09 DIAGNOSIS — J449 Chronic obstructive pulmonary disease, unspecified: Secondary | ICD-10-CM

## 2018-01-09 NOTE — Telephone Encounter (Signed)
Pt aware of results. Orders placed  Nothing further needed. 

## 2018-01-09 NOTE — Telephone Encounter (Signed)
Pt returning our call °Please call back ° °

## 2018-01-09 NOTE — Telephone Encounter (Signed)
Called and left message with spouse. Pt was asleep.   Severe OSA   [id number] Auto-cpap with pressure range of 5-20 cmH20 Recommend oximetry on cpap to ensure 02 improved on cpap.

## 2018-01-10 ENCOUNTER — Other Ambulatory Visit: Payer: Self-pay | Admitting: Internal Medicine

## 2018-01-10 DIAGNOSIS — J449 Chronic obstructive pulmonary disease, unspecified: Secondary | ICD-10-CM

## 2018-01-10 NOTE — Progress Notes (Signed)
ov

## 2018-01-12 LAB — COMP. METABOLIC PANEL (12)
AST: 29 IU/L (ref 0–40)
Albumin/Globulin Ratio: 1.1 — ABNORMAL LOW (ref 1.2–2.2)
Albumin: 4.1 g/dL (ref 3.5–5.5)
Alkaline Phosphatase: 107 IU/L (ref 39–117)
BUN/Creatinine Ratio: 23 (ref 9–23)
BUN: 31 mg/dL — ABNORMAL HIGH (ref 6–24)
Bilirubin Total: 0.5 mg/dL (ref 0.0–1.2)
Calcium: 9.7 mg/dL (ref 8.7–10.2)
Chloride: 90 mmol/L — ABNORMAL LOW (ref 96–106)
Creatinine, Ser: 1.32 mg/dL — ABNORMAL HIGH (ref 0.57–1.00)
GFR calc Af Amer: 55 mL/min/{1.73_m2} — ABNORMAL LOW (ref >59)
GFR calc non Af Amer: 48 mL/min/{1.73_m2} — ABNORMAL LOW (ref >59)
Globulin, Total: 3.7 g/dL (ref 1.5–4.5)
Glucose: 220 mg/dL — ABNORMAL HIGH (ref 65–99)
Potassium: 4 mmol/L (ref 3.5–5.2)
Sodium: 137 mmol/L (ref 134–144)
Total Protein: 7.8 g/dL (ref 6.0–8.5)

## 2018-01-12 LAB — 25-HYDROXY VITAMIN D LCMS D2+D3
25-Hydroxy, Vitamin D-2: 6.7 ng/mL
25-Hydroxy, Vitamin D-3: 19 ng/mL
25-Hydroxy, Vitamin D: 26 ng/mL — ABNORMAL LOW

## 2018-01-12 LAB — VITAMIN B12: Vitamin B-12: 388 pg/mL (ref 232–1245)

## 2018-01-12 LAB — C-REACTIVE PROTEIN: CRP: 53 mg/L — ABNORMAL HIGH (ref 0–10)

## 2018-01-12 LAB — MAGNESIUM: Magnesium: 2.2 mg/dL (ref 1.6–2.3)

## 2018-01-12 LAB — SEDIMENTATION RATE: Sed Rate: 56 mm/hr — ABNORMAL HIGH (ref 0–32)

## 2018-01-12 IMAGING — DX DG ABDOMEN 2V
3 series · 3 of 3 positions shown · non-contrast
Comparison: 01/27/2010 abdominal radiograph

CLINICAL DATA: Constipation

EXAM:
ABDOMEN - 2 VIEW

[abdomen supine (1 of 2)]
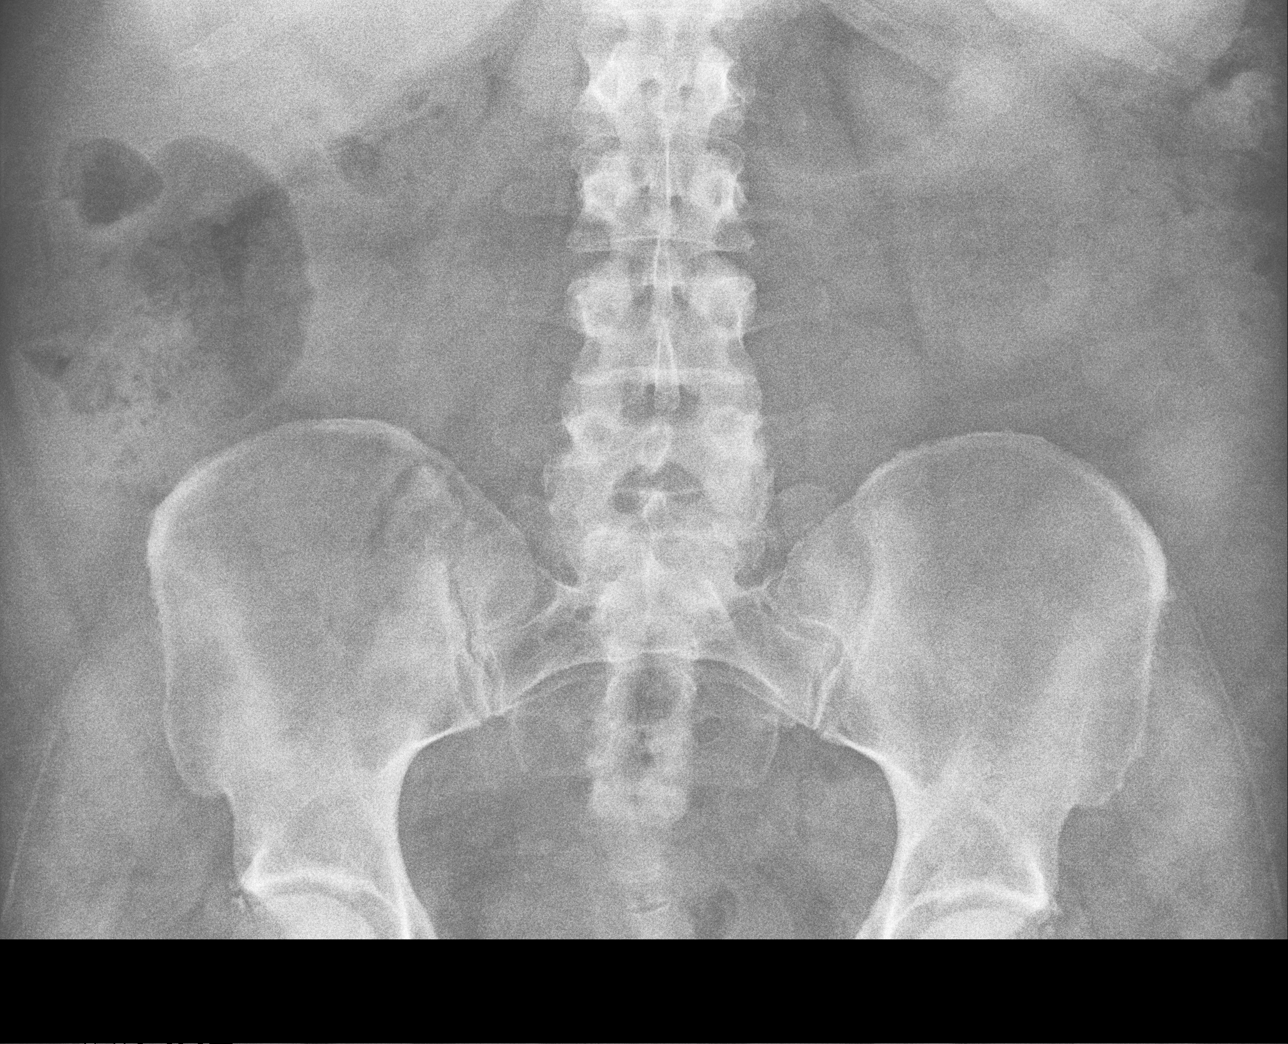

[abdomen erect]
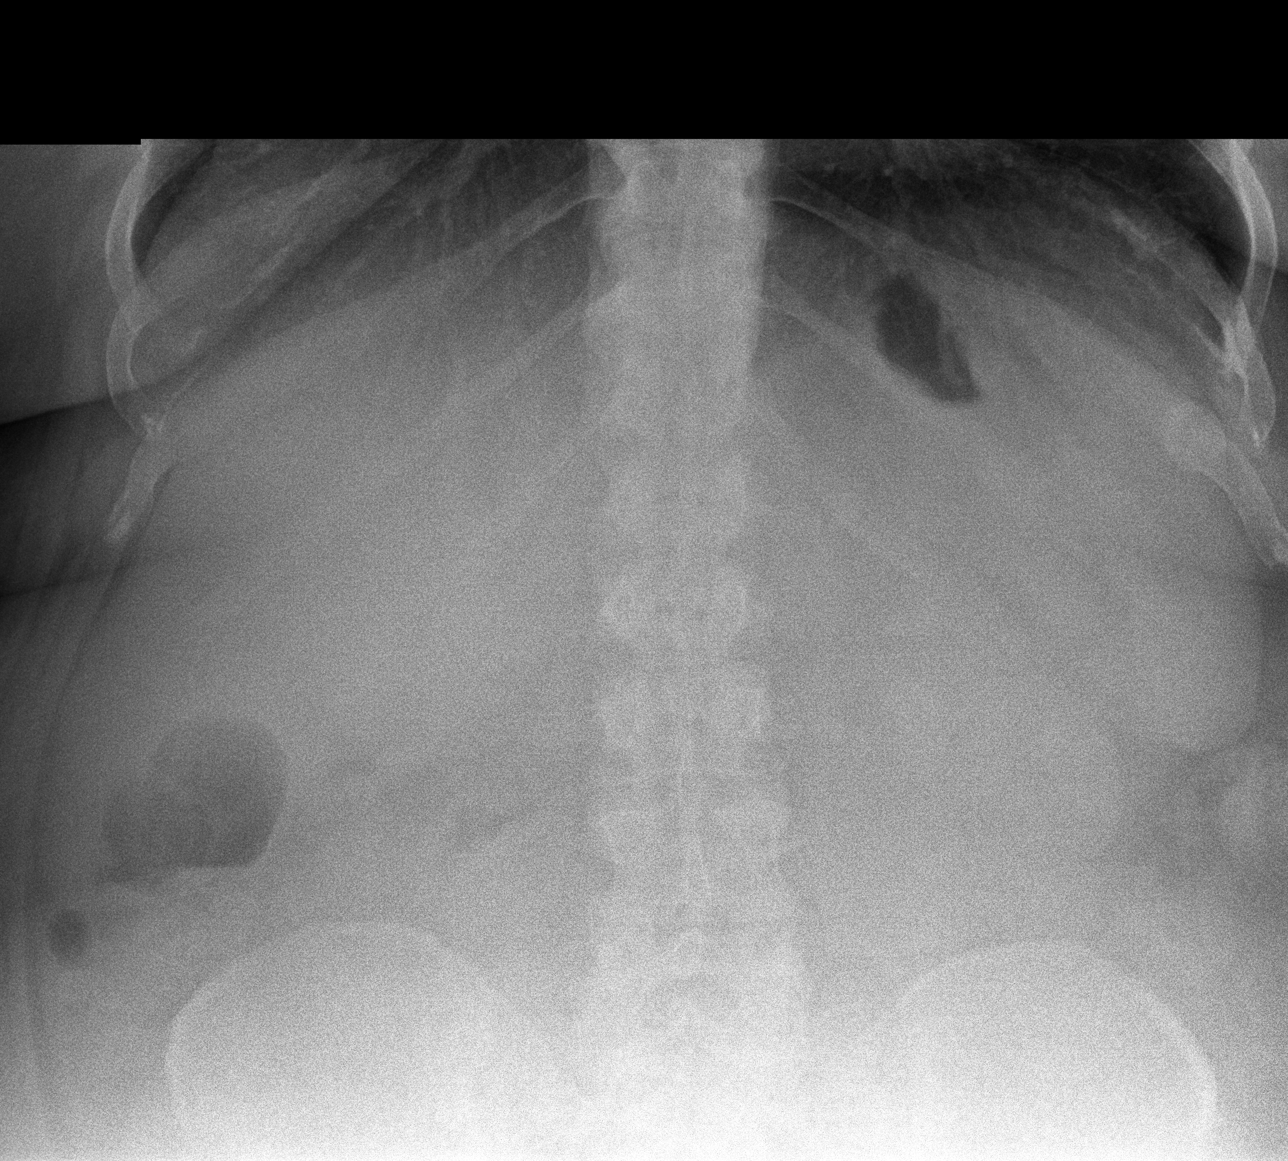

[abdomen supine (2 of 2)]
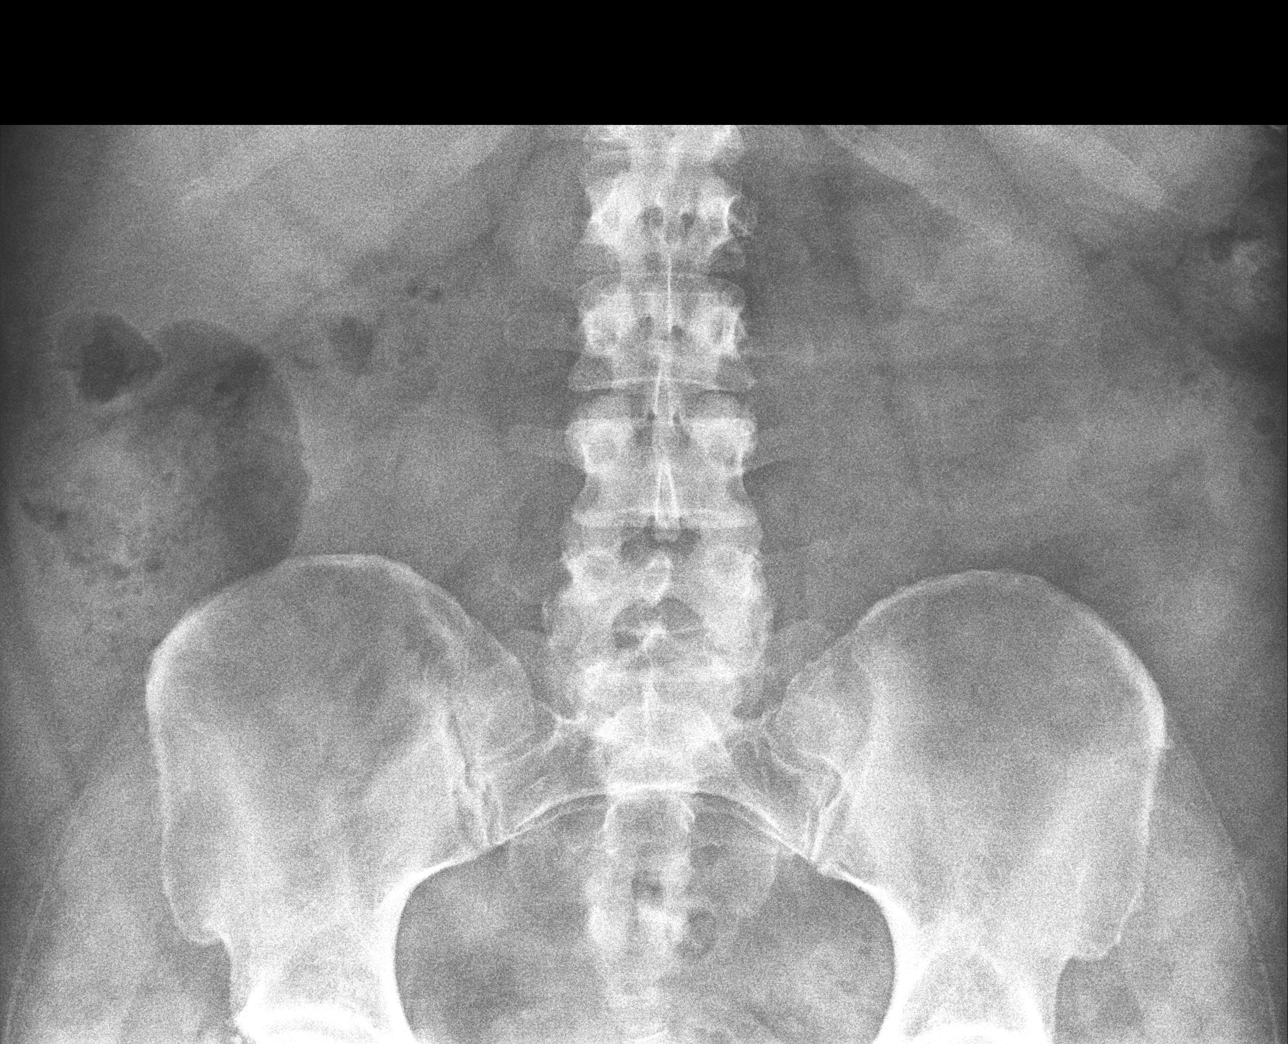

[3 of 3 positions shown; findings below may reference images not displayed]

FINDINGS: No dilated small bowel loops or significant air-fluid levels. Mild
colonic stool volume. No evidence of pneumatosis or
pneumoperitoneum. Clear lung bases. No radiopaque nephrolithiasis.
IMPRESSION: Nonobstructive bowel gas pattern.  Mild colonic stool volume.

## 2018-01-13 LAB — COMPLIANCE DRUG ANALYSIS, UR

## 2018-01-15 ENCOUNTER — Ambulatory Visit
Admission: RE | Admit: 2018-01-15 | Discharge: 2018-01-15 | Disposition: A | Discharge status: Home / Self Care | Payer: BLUE CROSS/BLUE SHIELD | Source: Ambulatory Visit | Attending: Nurse Practitioner | Admitting: Nurse Practitioner

## 2018-01-15 DIAGNOSIS — M545 Low back pain: Secondary | ICD-10-CM | POA: Diagnosis not present

## 2018-01-15 DIAGNOSIS — M5441 Lumbago with sciatica, right side: Secondary | ICD-10-CM | POA: Insufficient documentation

## 2018-01-15 DIAGNOSIS — M5442 Lumbago with sciatica, left side: Secondary | ICD-10-CM | POA: Insufficient documentation

## 2018-01-15 DIAGNOSIS — G8929 Other chronic pain: Secondary | ICD-10-CM | POA: Diagnosis not present

## 2018-01-15 DIAGNOSIS — I700 Atherosclerosis of aorta: Secondary | ICD-10-CM | POA: Insufficient documentation

## 2018-01-15 DIAGNOSIS — G4733 Obstructive sleep apnea (adult) (pediatric): Secondary | ICD-10-CM | POA: Diagnosis not present

## 2018-01-16 NOTE — Progress Notes (Signed)
Results were reviewed and found to be: mildly abnormal  No acute injury or pathology identified  Review would suggest interventional pain management techniques may be of benefit 

## 2018-01-18 ENCOUNTER — Telehealth: Payer: Self-pay | Admitting: Internal Medicine

## 2018-01-18 NOTE — Telephone Encounter (Signed)
Returned call to patient [id number]  ONO on cpap still pending. Pt states it feels as if her head is about to come off. She is going to bled some 02 into her cpap. I told her Dr. Ashby Dawes out of office today and I could not advise.

## 2018-01-18 NOTE — Telephone Encounter (Signed)
Pt calling stating she noticed when she takes her CPAP off and puts on her Oxygen on her headaches she's been having ease off She is not sure if this correlates together or not but wanted to know if this could be causing it.  Please call back

## 2018-01-19 ENCOUNTER — Other Ambulatory Visit: Payer: Self-pay

## 2018-01-19 ENCOUNTER — Emergency Department (HOSPITAL_COMMUNITY): Payer: BLUE CROSS/BLUE SHIELD

## 2018-01-19 ENCOUNTER — Encounter (HOSPITAL_COMMUNITY): Payer: Self-pay | Admitting: *Deleted

## 2018-01-19 ENCOUNTER — Ambulatory Visit (INDEPENDENT_AMBULATORY_CARE_PROVIDER_SITE_OTHER)
Admission: EM | Admit: 2018-01-19 | Discharge: 2018-01-19 | Disposition: A | Discharge status: Home / Self Care | Payer: BLUE CROSS/BLUE SHIELD | Source: Home / Self Care | Attending: Internal Medicine | Admitting: Internal Medicine

## 2018-01-19 ENCOUNTER — Other Ambulatory Visit: Payer: Self-pay | Admitting: Primary Care

## 2018-01-19 ENCOUNTER — Inpatient Hospital Stay (HOSPITAL_COMMUNITY)
Admission: EM | Admit: 2018-01-19 | Discharge: 2018-02-05 | DRG: 853 | Disposition: A | Discharge status: Other Acute Inpatient Hospital | Payer: BLUE CROSS/BLUE SHIELD | Attending: Internal Medicine | Admitting: Internal Medicine

## 2018-01-19 ENCOUNTER — Encounter (HOSPITAL_COMMUNITY): Payer: Self-pay

## 2018-01-19 DIAGNOSIS — E662 Morbid (severe) obesity with alveolar hypoventilation: Secondary | ICD-10-CM | POA: Diagnosis present

## 2018-01-19 DIAGNOSIS — N183 Chronic kidney disease, stage 3 unspecified: Secondary | ICD-10-CM | POA: Diagnosis present

## 2018-01-19 DIAGNOSIS — N179 Acute kidney failure, unspecified: Secondary | ICD-10-CM | POA: Diagnosis present

## 2018-01-19 DIAGNOSIS — G9341 Metabolic encephalopathy: Secondary | ICD-10-CM | POA: Diagnosis present

## 2018-01-19 DIAGNOSIS — Z8249 Family history of ischemic heart disease and other diseases of the circulatory system: Secondary | ICD-10-CM

## 2018-01-19 DIAGNOSIS — R062 Wheezing: Secondary | ICD-10-CM

## 2018-01-19 DIAGNOSIS — A4181 Sepsis due to Enterococcus: Secondary | ICD-10-CM | POA: Diagnosis not present

## 2018-01-19 DIAGNOSIS — Z515 Encounter for palliative care: Secondary | ICD-10-CM | POA: Diagnosis not present

## 2018-01-19 DIAGNOSIS — Z66 Do not resuscitate: Secondary | ICD-10-CM | POA: Diagnosis present

## 2018-01-19 DIAGNOSIS — E1122 Type 2 diabetes mellitus with diabetic chronic kidney disease: Secondary | ICD-10-CM | POA: Diagnosis not present

## 2018-01-19 DIAGNOSIS — I5032 Chronic diastolic (congestive) heart failure: Secondary | ICD-10-CM

## 2018-01-19 DIAGNOSIS — A419 Sepsis, unspecified organism: Secondary | ICD-10-CM

## 2018-01-19 DIAGNOSIS — S3140XA Unspecified open wound of vagina and vulva, initial encounter: Secondary | ICD-10-CM | POA: Diagnosis not present

## 2018-01-19 DIAGNOSIS — R6520 Severe sepsis without septic shock: Secondary | ICD-10-CM | POA: Diagnosis present

## 2018-01-19 DIAGNOSIS — G40909 Epilepsy, unspecified, not intractable, without status epilepticus: Secondary | ICD-10-CM | POA: Diagnosis present

## 2018-01-19 DIAGNOSIS — Z885 Allergy status to narcotic agent status: Secondary | ICD-10-CM

## 2018-01-19 DIAGNOSIS — L03314 Cellulitis of groin: Secondary | ICD-10-CM | POA: Diagnosis present

## 2018-01-19 DIAGNOSIS — Z7982 Long term (current) use of aspirin: Secondary | ICD-10-CM

## 2018-01-19 DIAGNOSIS — L98499 Non-pressure chronic ulcer of skin of other sites with unspecified severity: Secondary | ICD-10-CM | POA: Diagnosis not present

## 2018-01-19 DIAGNOSIS — Z8673 Personal history of transient ischemic attack (TIA), and cerebral infarction without residual deficits: Secondary | ICD-10-CM

## 2018-01-19 DIAGNOSIS — Z9981 Dependence on supplemental oxygen: Secondary | ICD-10-CM

## 2018-01-19 DIAGNOSIS — M793 Panniculitis, unspecified: Secondary | ICD-10-CM

## 2018-01-19 DIAGNOSIS — E349 Endocrine disorder, unspecified: Secondary | ICD-10-CM | POA: Diagnosis not present

## 2018-01-19 DIAGNOSIS — L03315 Cellulitis of perineum: Secondary | ICD-10-CM | POA: Diagnosis not present

## 2018-01-19 DIAGNOSIS — S3140XD Unspecified open wound of vagina and vulva, subsequent encounter: Secondary | ICD-10-CM | POA: Diagnosis not present

## 2018-01-19 DIAGNOSIS — Z6841 Body Mass Index (BMI) 40.0 and over, adult: Secondary | ICD-10-CM | POA: Diagnosis not present

## 2018-01-19 DIAGNOSIS — Z1621 Resistance to vancomycin: Secondary | ICD-10-CM | POA: Diagnosis present

## 2018-01-19 DIAGNOSIS — N182 Chronic kidney disease, stage 2 (mild): Secondary | ICD-10-CM | POA: Diagnosis present

## 2018-01-19 DIAGNOSIS — F319 Bipolar disorder, unspecified: Secondary | ICD-10-CM | POA: Diagnosis not present

## 2018-01-19 DIAGNOSIS — I152 Hypertension secondary to endocrine disorders: Secondary | ICD-10-CM | POA: Diagnosis present

## 2018-01-19 DIAGNOSIS — E1142 Type 2 diabetes mellitus with diabetic polyneuropathy: Secondary | ICD-10-CM | POA: Diagnosis present

## 2018-01-19 DIAGNOSIS — Z7951 Long term (current) use of inhaled steroids: Secondary | ICD-10-CM

## 2018-01-19 DIAGNOSIS — N762 Acute vulvitis: Secondary | ICD-10-CM | POA: Diagnosis not present

## 2018-01-19 DIAGNOSIS — R102 Pelvic and perineal pain: Secondary | ICD-10-CM | POA: Diagnosis not present

## 2018-01-19 DIAGNOSIS — G8929 Other chronic pain: Secondary | ICD-10-CM | POA: Diagnosis present

## 2018-01-19 DIAGNOSIS — Z79899 Other long term (current) drug therapy: Secondary | ICD-10-CM

## 2018-01-19 DIAGNOSIS — G4730 Sleep apnea, unspecified: Secondary | ICD-10-CM | POA: Diagnosis not present

## 2018-01-19 DIAGNOSIS — F419 Anxiety disorder, unspecified: Secondary | ICD-10-CM | POA: Diagnosis present

## 2018-01-19 DIAGNOSIS — Z87891 Personal history of nicotine dependence: Secondary | ICD-10-CM

## 2018-01-19 DIAGNOSIS — G629 Polyneuropathy, unspecified: Secondary | ICD-10-CM

## 2018-01-19 DIAGNOSIS — Z794 Long term (current) use of insulin: Secondary | ICD-10-CM

## 2018-01-19 DIAGNOSIS — G9340 Encephalopathy, unspecified: Secondary | ICD-10-CM

## 2018-01-19 DIAGNOSIS — L03311 Cellulitis of abdominal wall: Secondary | ICD-10-CM | POA: Diagnosis present

## 2018-01-19 DIAGNOSIS — I2510 Atherosclerotic heart disease of native coronary artery without angina pectoris: Secondary | ICD-10-CM | POA: Diagnosis present

## 2018-01-19 DIAGNOSIS — N764 Abscess of vulva: Secondary | ICD-10-CM | POA: Diagnosis present

## 2018-01-19 DIAGNOSIS — E11628 Type 2 diabetes mellitus with other skin complications: Secondary | ICD-10-CM | POA: Diagnosis present

## 2018-01-19 DIAGNOSIS — Z86718 Personal history of other venous thrombosis and embolism: Secondary | ICD-10-CM

## 2018-01-19 DIAGNOSIS — I10 Essential (primary) hypertension: Secondary | ICD-10-CM | POA: Diagnosis not present

## 2018-01-19 DIAGNOSIS — J449 Chronic obstructive pulmonary disease, unspecified: Secondary | ICD-10-CM | POA: Diagnosis present

## 2018-01-19 DIAGNOSIS — E1165 Type 2 diabetes mellitus with hyperglycemia: Secondary | ICD-10-CM | POA: Diagnosis not present

## 2018-01-19 DIAGNOSIS — K219 Gastro-esophageal reflux disease without esophagitis: Secondary | ICD-10-CM | POA: Diagnosis present

## 2018-01-19 DIAGNOSIS — E119 Type 2 diabetes mellitus without complications: Secondary | ICD-10-CM | POA: Diagnosis not present

## 2018-01-19 DIAGNOSIS — J9811 Atelectasis: Secondary | ICD-10-CM | POA: Diagnosis not present

## 2018-01-19 DIAGNOSIS — G2581 Restless legs syndrome: Secondary | ICD-10-CM

## 2018-01-19 DIAGNOSIS — I130 Hypertensive heart and chronic kidney disease with heart failure and stage 1 through stage 4 chronic kidney disease, or unspecified chronic kidney disease: Secondary | ICD-10-CM | POA: Diagnosis present

## 2018-01-19 DIAGNOSIS — M726 Necrotizing fasciitis: Secondary | ICD-10-CM | POA: Diagnosis present

## 2018-01-19 DIAGNOSIS — Z8489 Family history of other specified conditions: Secondary | ICD-10-CM

## 2018-01-19 DIAGNOSIS — R748 Abnormal levels of other serum enzymes: Secondary | ICD-10-CM | POA: Diagnosis present

## 2018-01-19 DIAGNOSIS — R7989 Other specified abnormal findings of blood chemistry: Secondary | ICD-10-CM

## 2018-01-19 DIAGNOSIS — R0602 Shortness of breath: Secondary | ICD-10-CM | POA: Diagnosis not present

## 2018-01-19 DIAGNOSIS — L0390 Cellulitis, unspecified: Secondary | ICD-10-CM

## 2018-01-19 DIAGNOSIS — M7989 Other specified soft tissue disorders: Secondary | ICD-10-CM | POA: Diagnosis not present

## 2018-01-19 DIAGNOSIS — Z880 Allergy status to penicillin: Secondary | ICD-10-CM

## 2018-01-19 DIAGNOSIS — L8990 Pressure ulcer of unspecified site, unspecified stage: Secondary | ICD-10-CM

## 2018-01-19 DIAGNOSIS — I452 Bifascicular block: Secondary | ICD-10-CM | POA: Diagnosis present

## 2018-01-19 DIAGNOSIS — Z833 Family history of diabetes mellitus: Secondary | ICD-10-CM

## 2018-01-19 DIAGNOSIS — E0801 Diabetes mellitus due to underlying condition with hyperosmolarity with coma: Secondary | ICD-10-CM | POA: Diagnosis not present

## 2018-01-19 DIAGNOSIS — G4733 Obstructive sleep apnea (adult) (pediatric): Secondary | ICD-10-CM

## 2018-01-19 DIAGNOSIS — D649 Anemia, unspecified: Secondary | ICD-10-CM | POA: Diagnosis not present

## 2018-01-19 DIAGNOSIS — N9089 Other specified noninflammatory disorders of vulva and perineum: Secondary | ICD-10-CM | POA: Diagnosis not present

## 2018-01-19 DIAGNOSIS — Z825 Family history of asthma and other chronic lower respiratory diseases: Secondary | ICD-10-CM

## 2018-01-19 DIAGNOSIS — I472 Ventricular tachycardia: Secondary | ICD-10-CM | POA: Diagnosis present

## 2018-01-19 DIAGNOSIS — E1110 Type 2 diabetes mellitus with ketoacidosis without coma: Secondary | ICD-10-CM | POA: Diagnosis not present

## 2018-01-19 DIAGNOSIS — K589 Irritable bowel syndrome without diarrhea: Secondary | ICD-10-CM | POA: Diagnosis not present

## 2018-01-19 DIAGNOSIS — R1900 Intra-abdominal and pelvic swelling, mass and lump, unspecified site: Secondary | ICD-10-CM | POA: Diagnosis not present

## 2018-01-19 DIAGNOSIS — Z720 Tobacco use: Secondary | ICD-10-CM | POA: Diagnosis not present

## 2018-01-19 DIAGNOSIS — E1159 Type 2 diabetes mellitus with other circulatory complications: Secondary | ICD-10-CM | POA: Diagnosis present

## 2018-01-19 DIAGNOSIS — Z91048 Other nonmedicinal substance allergy status: Secondary | ICD-10-CM

## 2018-01-19 DIAGNOSIS — R011 Cardiac murmur, unspecified: Secondary | ICD-10-CM | POA: Diagnosis present

## 2018-01-19 DIAGNOSIS — F17200 Nicotine dependence, unspecified, uncomplicated: Secondary | ICD-10-CM | POA: Diagnosis present

## 2018-01-19 DIAGNOSIS — Z888 Allergy status to other drugs, medicaments and biological substances status: Secondary | ICD-10-CM

## 2018-01-19 DIAGNOSIS — Z7189 Other specified counseling: Secondary | ICD-10-CM | POA: Diagnosis not present

## 2018-01-19 HISTORY — DX: Sepsis, unspecified organism: A41.9

## 2018-01-19 HISTORY — DX: Endocrine disorder, unspecified: E34.9

## 2018-01-19 HISTORY — DX: Other specified abnormal findings of blood chemistry: R79.89

## 2018-01-19 LAB — CBC WITH DIFFERENTIAL/PLATELET
Abs Immature Granulocytes: 0.5 10*3/uL — ABNORMAL HIGH (ref 0.0–0.1)
Basophils Absolute: 0.1 10*3/uL (ref 0.0–0.1)
Basophils Relative: 0 %
Eosinophils Absolute: 0.1 10*3/uL (ref 0.0–0.7)
Eosinophils Relative: 0 %
HCT: 36.3 % (ref 36.0–46.0)
Hemoglobin: 11.7 g/dL — ABNORMAL LOW (ref 12.0–15.0)
Immature Granulocytes: 2 %
Lymphocytes Relative: 5 %
Lymphs Abs: 1.3 10*3/uL (ref 0.7–4.0)
MCH: 27 pg (ref 26.0–34.0)
MCHC: 32.2 g/dL (ref 30.0–36.0)
MCV: 83.8 fL (ref 78.0–100.0)
Monocytes Absolute: 1.4 10*3/uL — ABNORMAL HIGH (ref 0.1–1.0)
Monocytes Relative: 6 %
Neutro Abs: 21.4 10*3/uL — ABNORMAL HIGH (ref 1.7–7.7)
Neutrophils Relative %: 87 %
Platelets: 186 10*3/uL (ref 150–400)
RBC: 4.33 MIL/uL (ref 3.87–5.11)
RDW: 17.7 % — ABNORMAL HIGH (ref 11.5–15.5)
WBC: 24.6 10*3/uL — ABNORMAL HIGH (ref 4.0–10.5)

## 2018-01-19 LAB — COMPREHENSIVE METABOLIC PANEL
ALT: 25 U/L (ref 0–44)
AST: 28 U/L (ref 15–41)
Albumin: 3.2 g/dL — ABNORMAL LOW (ref 3.5–5.0)
Alkaline Phosphatase: 118 U/L (ref 38–126)
Anion gap: 17 — ABNORMAL HIGH (ref 5–15)
BUN: 60 mg/dL — ABNORMAL HIGH (ref 6–20)
CO2: 28 mmol/L (ref 22–32)
Calcium: 8.7 mg/dL — ABNORMAL LOW (ref 8.9–10.3)
Chloride: 79 mmol/L — ABNORMAL LOW (ref 98–111)
Creatinine, Ser: 2.18 mg/dL — ABNORMAL HIGH (ref 0.44–1.00)
GFR calc Af Amer: 30 mL/min — ABNORMAL LOW (ref >60)
GFR calc non Af Amer: 26 mL/min — ABNORMAL LOW (ref >60)
Glucose, Bld: 442 mg/dL — ABNORMAL HIGH (ref 70–99)
Potassium: 3.7 mmol/L (ref 3.5–5.1)
Sodium: 124 mmol/L — ABNORMAL LOW (ref 135–145)
Total Bilirubin: 1.3 mg/dL — ABNORMAL HIGH (ref 0.3–1.2)
Total Protein: 8 g/dL (ref 6.5–8.1)

## 2018-01-19 LAB — GLUCOSE, CAPILLARY: Glucose-Capillary: 474 mg/dL — ABNORMAL HIGH (ref 70–99)

## 2018-01-19 LAB — I-STAT BETA HCG BLOOD, ED (MC, WL, AP ONLY): I-stat hCG, quantitative: 21.4 m[IU]/mL — ABNORMAL HIGH (ref <5)

## 2018-01-19 LAB — I-STAT VENOUS BLOOD GAS, ED
Acid-Base Excess: 5 mmol/L — ABNORMAL HIGH (ref 0.0–2.0)
Bicarbonate: 30.5 mmol/L — ABNORMAL HIGH (ref 20.0–28.0)
O2 Saturation: 62 %
TCO2: 32 mmol/L (ref 22–32)
pCO2, Ven: 49.5 mmHg (ref 44.0–60.0)
pH, Ven: 7.398 (ref 7.250–7.430)
pO2, Ven: 33 mmHg (ref 32.0–45.0)

## 2018-01-19 LAB — I-STAT CG4 LACTIC ACID, ED
Lactic Acid, Venous: 3.54 mmol/L (ref 0.5–1.9)
Lactic Acid, Venous: 3.06 mmol/L (ref 0.5–1.9)
Lactic Acid, Venous: 1.5 mmol/L (ref 0.5–1.9)

## 2018-01-19 LAB — URINALYSIS, ROUTINE W REFLEX MICROSCOPIC
Bilirubin Urine: NEGATIVE
Glucose, UA: 150 mg/dL — AB
Ketones, ur: NEGATIVE mg/dL
Nitrite: NEGATIVE
Protein, ur: 100 mg/dL — AB
RBC / HPF: >50 RBC/hpf — ABNORMAL HIGH (ref 0–5)
Specific Gravity, Urine: 1.013 (ref 1.005–1.030)
WBC, UA: >50 WBC/hpf — ABNORMAL HIGH (ref 0–5)
pH: 5 (ref 5.0–8.0)

## 2018-01-19 LAB — HCG, QUANTITATIVE, PREGNANCY: hCG, Beta Chain, Quant, S: 19 m[IU]/mL — ABNORMAL HIGH (ref <5)

## 2018-01-19 MED ORDER — KETOROLAC TROMETHAMINE 60 MG/2ML IM SOLN
INTRAMUSCULAR | Status: AC
  Filled 2018-01-19: qty 2

## 2018-01-19 MED ORDER — SODIUM CHLORIDE 0.9 % IV BOLUS (SEPSIS)
1000.0000 mL | Freq: Once | INTRAVENOUS | Status: AC
  Administered 2018-01-19: 1000 mL via INTRAVENOUS

## 2018-01-19 MED ORDER — POTASSIUM CHLORIDE IN NACL 20-0.9 MEQ/L-% IV SOLN
INTRAVENOUS | Status: DC
  Administered 2018-01-19 – 2018-01-22 (×6): via INTRAVENOUS
  Filled 2018-01-19 (×6): qty 1000

## 2018-01-19 MED ORDER — TRAZODONE HCL 50 MG PO TABS
150.0000 mg | ORAL_TABLET | Freq: Every day | ORAL | Status: DC
  Administered 2018-01-19 – 2018-02-01 (×12): 150 mg via ORAL
  Filled 2018-01-19: qty 1
  Filled 2018-01-19 (×2): qty 3
  Filled 2018-01-19: qty 1
  Filled 2018-01-19: qty 3
  Filled 2018-01-19 (×2): qty 1
  Filled 2018-01-19 (×2): qty 3
  Filled 2018-01-19: qty 1
  Filled 2018-01-19 (×2): qty 3

## 2018-01-19 MED ORDER — INSULIN ASPART 100 UNIT/ML ~~LOC~~ SOLN: 0.0000 [IU] | Freq: Three times a day (TID) | SUBCUTANEOUS | Status: DC

## 2018-01-19 MED ORDER — KETOROLAC TROMETHAMINE 60 MG/2ML IM SOLN
60.0000 mg | Freq: Once | INTRAMUSCULAR | Status: AC
  Administered 2018-01-19: 60 mg via INTRAMUSCULAR

## 2018-01-19 MED ORDER — FENTANYL CITRATE (PF) 100 MCG/2ML IJ SOLN
50.0000 ug | Freq: Once | INTRAMUSCULAR | Status: AC
  Administered 2018-01-19: 50 ug via INTRAVENOUS
  Filled 2018-01-19: qty 2

## 2018-01-19 MED ORDER — VANCOMYCIN HCL 10 G IV SOLR
1500.0000 mg | INTRAVENOUS | Status: DC
  Administered 2018-01-19 – 2018-01-24 (×6): 1500 mg via INTRAVENOUS
  Filled 2018-01-19 (×8): qty 1500

## 2018-01-19 MED ORDER — FLUOXETINE HCL 20 MG PO CAPS
40.0000 mg | ORAL_CAPSULE | Freq: Every day | ORAL | Status: DC
  Administered 2018-01-20 – 2018-01-25 (×6): 40 mg via ORAL
  Filled 2018-01-19 (×6): qty 2

## 2018-01-19 MED ORDER — ACETAMINOPHEN 325 MG PO TABS
650.0000 mg | ORAL_TABLET | Freq: Once | ORAL | Status: AC
  Administered 2018-01-19: 650 mg via ORAL
  Filled 2018-01-19: qty 2

## 2018-01-19 MED ORDER — PREGABALIN 100 MG PO CAPS
300.0000 mg | ORAL_CAPSULE | Freq: Two times a day (BID) | ORAL | Status: DC
  Administered 2018-01-19 – 2018-02-05 (×33): 300 mg via ORAL
  Filled 2018-01-19 (×33): qty 3

## 2018-01-19 MED ORDER — INSULIN REGULAR HUMAN (CONC) 500 UNIT/ML ~~LOC~~ SOPN
400.0000 [IU] | PEN_INJECTOR | Freq: Two times a day (BID) | SUBCUTANEOUS | Status: DC
  Filled 2018-01-19: qty 3

## 2018-01-19 MED ORDER — ALBUTEROL SULFATE (2.5 MG/3ML) 0.083% IN NEBU
5.0000 mg | INHALATION_SOLUTION | Freq: Once | RESPIRATORY_TRACT | Status: AC
  Administered 2018-01-19: 5 mg via RESPIRATORY_TRACT
  Filled 2018-01-19: qty 6

## 2018-01-19 MED ORDER — HEPARIN SODIUM (PORCINE) 5000 UNIT/ML IJ SOLN
5000.0000 [IU] | Freq: Three times a day (TID) | INTRAMUSCULAR | Status: DC
  Administered 2018-01-19 – 2018-01-26 (×19): 5000 [IU] via SUBCUTANEOUS
  Filled 2018-01-19 (×19): qty 1

## 2018-01-19 MED ORDER — INSULIN ASPART 100 UNIT/ML ~~LOC~~ SOLN: 0.0000 [IU] | Freq: Every day | SUBCUTANEOUS | Status: DC

## 2018-01-19 MED ORDER — ALBUTEROL SULFATE (2.5 MG/3ML) 0.083% IN NEBU
2.5000 mg | INHALATION_SOLUTION | Freq: Four times a day (QID) | RESPIRATORY_TRACT | Status: DC | PRN
  Administered 2018-01-21 – 2018-01-23 (×2): 2.5 mg via RESPIRATORY_TRACT
  Filled 2018-01-19 (×2): qty 3

## 2018-01-19 MED ORDER — LINACLOTIDE 145 MCG PO CAPS
290.0000 ug | ORAL_CAPSULE | Freq: Every day | ORAL | Status: DC
  Administered 2018-01-20 – 2018-01-25 (×2): 290 ug via ORAL
  Filled 2018-01-19 (×6): qty 2

## 2018-01-19 MED ORDER — SODIUM CHLORIDE 0.9 % IV SOLN
1.0000 g | Freq: Three times a day (TID) | INTRAVENOUS | Status: DC
  Administered 2018-01-19 – 2018-02-02 (×41): 1 g via INTRAVENOUS
  Filled 2018-01-19 (×45): qty 1

## 2018-01-19 MED ORDER — BUSPIRONE HCL 15 MG PO TABS
30.0000 mg | ORAL_TABLET | Freq: Two times a day (BID) | ORAL | Status: DC
  Administered 2018-01-19 – 2018-02-05 (×33): 30 mg via ORAL
  Filled 2018-01-19 (×3): qty 2
  Filled 2018-01-19: qty 6
  Filled 2018-01-19 (×2): qty 2
  Filled 2018-01-19: qty 3
  Filled 2018-01-19: qty 2
  Filled 2018-01-19: qty 6
  Filled 2018-01-19 (×2): qty 2
  Filled 2018-01-19: qty 6
  Filled 2018-01-19: qty 3
  Filled 2018-01-19 (×5): qty 2
  Filled 2018-01-19: qty 6
  Filled 2018-01-19: qty 2
  Filled 2018-01-19 (×2): qty 3
  Filled 2018-01-19: qty 6
  Filled 2018-01-19 (×2): qty 2
  Filled 2018-01-19 (×2): qty 3
  Filled 2018-01-19: qty 2
  Filled 2018-01-19 (×2): qty 6
  Filled 2018-01-19 (×3): qty 2

## 2018-01-19 MED ORDER — ALPRAZOLAM 0.5 MG PO TABS
1.0000 mg | ORAL_TABLET | Freq: Three times a day (TID) | ORAL | Status: DC | PRN
  Administered 2018-01-20 – 2018-02-05 (×7): 1 mg via ORAL
  Filled 2018-01-19 (×7): qty 2

## 2018-01-19 MED ORDER — MOMETASONE FURO-FORMOTEROL FUM 100-5 MCG/ACT IN AERO
2.0000 | INHALATION_SPRAY | Freq: Two times a day (BID) | RESPIRATORY_TRACT | Status: DC
  Administered 2018-01-19 – 2018-02-05 (×27): 2 via RESPIRATORY_TRACT
  Filled 2018-01-19 (×2): qty 8.8

## 2018-01-19 MED ORDER — IPRATROPIUM BROMIDE 0.02 % IN SOLN
0.5000 mg | Freq: Once | RESPIRATORY_TRACT | Status: AC
  Administered 2018-01-19: 0.5 mg via RESPIRATORY_TRACT
  Filled 2018-01-19: qty 2.5

## 2018-01-19 MED ORDER — METRONIDAZOLE IN NACL 5-0.79 MG/ML-% IV SOLN
500.0000 mg | Freq: Three times a day (TID) | INTRAVENOUS | Status: DC
  Administered 2018-01-19 – 2018-02-02 (×40): 500 mg via INTRAVENOUS
  Filled 2018-01-19 (×43): qty 100

## 2018-01-19 MED ORDER — FAMOTIDINE 20 MG PO TABS
20.0000 mg | ORAL_TABLET | Freq: Every day | ORAL | Status: DC
  Administered 2018-01-20 – 2018-02-05 (×17): 20 mg via ORAL
  Filled 2018-01-19 (×18): qty 1

## 2018-01-19 MED ORDER — INSULIN ASPART 100 UNIT/ML ~~LOC~~ SOLN
12.0000 [IU] | Freq: Once | SUBCUTANEOUS | Status: AC
  Administered 2018-01-19: 12 [IU] via SUBCUTANEOUS

## 2018-01-19 NOTE — ED Notes (Signed)
Critical lactic of 3.54 received from mini lab.  Will prioritize rooming for patient.

## 2018-01-19 NOTE — ED Notes (Signed)
Patient asked about a urine sample, unable to provide at this time

## 2018-01-19 NOTE — ED Notes (Signed)
RN at [facility] notified critical istat lab value

## 2018-01-19 NOTE — ED Notes (Signed)
Admitting provider bedside 

## 2018-01-19 NOTE — ED Triage Notes (Signed)
Pt in from urgent care, sent over for treatment of cellulitis, pt has rash and swelling to thighs and vaginal area, denies fever

## 2018-01-19 NOTE — ED Provider Notes (Signed)
[facility]    CSN: [id number] Arrival date & time: 01/19/18  1236     History   Chief Complaint Chief Complaint  Patient presents with  . Rash between legs and knot on vaginal area    HPI [name] is a 47 y.o. female history of COPD, CAD, CHF, CKD, hypertension, hyperlipidemia, DM type II presenting today for evaluation of rash/heart skin to left groin area.  States that symptoms have developed over the past 3 days.  That progressed quickly and has caused her to have a lot of pain.  Pain with walking and sitting.  Denies any drainage from this area.  Denies previous issues.  They have been applying antibiotic ointment to this area without relief.  She has been taking Tylenol without relief of pain, last dose was just prior to visit.  She has felt increasingly weak and off balance.  Unsure of fevers.  HPI  Past Medical History:  Diagnosis Date  . Anxiety    takes Prozac daily  . Anxiety   . Aortic valve calcification   . Asthma    Advair and Spirva daily  . Asthma   . Bipolar disorder ([place])   . CAD (coronary artery disease)    a. LHC 11/2013 done for CP/fluid retention: mild disease in prox LAD, mild-mod disease in mRCA, EF 60% with normal LVEDP. b. Normal nuc 03/2016.  Marland Kitchen CHF (congestive heart failure) ([place])   . Chronic diastolic CHF (congestive heart failure) ([place])   . CKD (chronic kidney disease), stage II   . COPD (chronic obstructive pulmonary disease) (HCC)    a. nocturnal O2.  Marland Kitchen COPD (chronic obstructive pulmonary disease) ([place])   . Coronary artery disease   . Diabetes mellitus   . Diabetes mellitus without complication ([place])   . Dyspnea   . Family history of adverse reaction to anesthesia    mom gets nauseated  . GERD (gastroesophageal reflux disease)    takes Pepcid daily  . History of blood clots    left leg 3-1yrs ago  . Hyperlipidemia   . Hypertension   . Hypertriglyceridemia   . Inguinal hernia, left 01/2015  . Muscle spasm   .  Peripheral neuropathy   . RBBB   . Seizures ([place])   . Sinus tachycardia    a. persistent since 2009.  Marland Kitchen Smokers' cough ([place])   . Stroke [facility]) 1989   left sided weakness  . TIA (transient ischemic attack)   . Tobacco abuse     Patient Active Problem List   Diagnosis Date Noted  . Chronic pain of both lower extremities (Primary Area of Pain) 01/08/2018  . Chronic bilateral low back pain with bilateral sciatica [facility] Area of Pain) (midline) 01/08/2018  . Chronic pain syndrome 01/08/2018  . Chronic upper extremity pain (Secondary Area of Pain) 01/08/2018  . Pharmacologic therapy 01/08/2018  . Disorder of skeletal system 01/08/2018  . Problems influencing health status 01/08/2018  . Long term current use of opiate analgesic 01/08/2018  . Restless leg syndrome 08/02/2017  . Nocturnal hypoxemia 03/29/2017  . Vision disturbance 02/28/2017  . CKD (chronic kidney disease), stage II 02/28/2017  . Visual loss, bilateral   . Chronic respiratory failure with hypoxia (HCC)/ nocturnal 02 dep  10/28/2016  . Upper airway cough syndrome 08/22/2016  . Cigarette smoker 06/15/2016  . Simple chronic bronchitis ([place])   . Coronary artery disease involving native heart without angina pectoris 05/16/2016  . Acute on chronic diastolic (congestive) heart failure (  [place]) 11/04/2015  . Orthopnea 11/03/2015  . Dyspnea   . Bilateral leg edema   . Diabetes ([place])   . Decreased urine stream   . COPD exacerbation ([place]) 10/15/2015  . Fracture of rib, closed 10/15/2015  . Venous stasis of both lower extremities 03/29/2015  . Preventative health care 02/04/2015  . Essential hypertension 01/02/2015  . Inguinal hernia 11/27/2014  . Allergic rhinitis with postnasal drip 01/21/2014  . Aortic valve disorders 04/18/2013  . Numbness and tingling 04/01/2013  . Sleep apnea 05/22/2012  . Sinus tachycardia 10/09/2011  . Seizure disorder ([place]) 10/09/2011  . Bipolar disorder ([place]) 10/09/2011  . TIA (transient  ischemic attack) 06/22/2011  . Constipation 06/15/2011  . Diabetic polyneuropathy ([place]) 06/08/2010  . HYPERTRIGLYCERIDEMIA 07/17/2007  . COPD GOLD 0  05/30/2007  . Morbid (severe) obesity due to excess calories ([place]) 04/23/2007  . Tobacco abuse 09/07/2006  . Asthma, persistent 09/07/2006    Past Surgical History:  Procedure Laterality Date  . HERNIA REPAIR    . INGUINAL HERNIA REPAIR Left 04/08/2015   Procedure: OPEN LEFT INGUINAL HERNIA REPAIR WITH MESH;  Surgeon: Ralene Ok, MD;  Location: [facility];  Service: General;  Laterality: Left;  . INSERTION OF MESH Left 04/08/2015   Procedure: INSERTION OF MESH;  Surgeon: Ralene Ok, MD;  Location: [facility];  Service: General;  Laterality: Left;  . LAPAROSCOPY     Endometriosis  . LEFT HEART CATHETERIZATION WITH CORONARY ANGIOGRAM N/A 12/05/2013   Procedure: LEFT HEART CATHETERIZATION WITH CORONARY ANGIOGRAM;  Surgeon: Jettie Booze, MD;  Location[facility] CATH LAB;  Service: Cardiovascular;  Laterality: N/A;  . right kidney drained    . TEE WITHOUT CARDIOVERSION N/A 11/28/2013   Procedure: TRANSESOPHAGEAL ECHOCARDIOGRAM (TEE);  Surgeon: Thayer Headings, MD;  Location: [facility];  Service: Cardiovascular;  Laterality: N/A;    OB History   None      Home Medications    Prior to Admission medications   Medication Sig Start Date End Date Taking? Authorizing Provider  acetaminophen (TYLENOL) 325 MG tablet Take 2 tablets (650 mg total) by mouth every 4 (four) hours as needed for headache or mild pain. 03/19/17   Patwardhan, Reynold Bowen, MD  albuterol (PROVENTIL HFA;VENTOLIN HFA) 108 (90 Base) MCG/ACT inhaler INHALE 2 PUFFS INTO LUNGS EVERY 4 HOURS AS NEEDED FOR SHORTNESS OF BREATH 01/09/18   Pleas Koch, NP  ALPRAZolam Duanne Moron) 1 MG tablet Take 1 mg by mouth 2 (two) times daily.     [provider]  aspirin EC 81 MG tablet Take 81 mg by mouth every morning.    [provider]  budesonide-formoterol (SYMBICORT) 80-4.5  MCG/ACT inhaler INHALE 2 PUFFS BY MOUTH EVERY 12 HOURS TO PREVENT COUGH OR WHEEZING *RINSE MOUTH AFTER USSING* 01/09/18   Pleas Koch, NP  busPIRone (BUSPAR) 30 MG tablet Take 1 tablet (30 mg total) by mouth 2 (two) times daily. 05/05/17   Pleas Koch, NP  FLUoxetine (PROZAC) 40 MG capsule Take 40 mg by mouth daily.    [provider]  glucose blood (ONE TOUCH ULTRA TEST) test strip Used to check blood sugars 4x daily. 08/23/17   Renato Shin, MD  Insulin Pen Needle 32G X 4 MM MISC Used to give insulin injections twice daily. 08/23/17   Renato Shin, MD  insulin regular human CONCENTRATED (HUMULIN R U-500 KWIKPEN) 500 UNIT/ML kwikpen Inject 400 Units into the skin 2 (two) times daily with a meal. Patient taking differently: Inject 175-300 Units into  the skin 2 (two) times daily with a meal.  09/01/17   Renato Shin, MD  ipratropium-albuterol (DUONEB) 0.5-2.5 (3) MG/3ML SOLN Take 3 mLs by nebulization every 4 (four) hours as needed (wheezing/shortness of breath). 08/02/17   Pleas Koch, NP  linaclotide [facility]) 290 MCG CAPS capsule Take 1 capsule (290 mcg total) by mouth daily before breakfast. 06/06/17 12/13/17  Jonathon Bellows, MD  LYRICA 300 MG capsule TAKE 1 CAPSULE BY MOUTH TWICE DAILY FOR NEUROPATHY 10/25/17   Pleas Koch, NP  metFORMIN (GLUCOPHAGE) 1000 MG tablet Take 1,000 mg by mouth 2 (two) times daily with a meal.    [provider]  metolazone (ZAROXOLYN) 2.5 MG tablet Take 2.5 mg by mouth every other day. Take 2.5 mg by mouth every 48 hours on Tuesday, Thursday and Saturday    [provider]  nicotine (NICODERM CQ - DOSED IN MG/24 HOURS) 21 mg/24hr patch Place 1 patch (21 mg total) at bedtime onto the skin. 05/23/17   Pleas Koch, NP  ondansetron (ZOFRAN) 4 MG tablet Take 4 mg by mouth every 6 (six) hours as needed for nausea or vomiting.    [provider]  OXYGEN Inhale 4 L into the lungs at bedtime.     [provider]  PRESCRIPTION MEDICATION Place 1 drop into both eyes daily as needed (every 14 weeks before injections).    [provider]  ranitidine (ZANTAC) 150 MG tablet Take 150 mg by mouth 2 (two) times daily.     [provider]  rOPINIRole (REQUIP) 0.5 MG tablet Take 1 tablet (0.5 mg total) by mouth at bedtime. 08/02/17   Pleas Koch, NP  Semaglutide (OZEMPIC) 0.25 or 0.5 MG/DOSE SOPN Inject 0.5 mg into the skin once a week. 12/06/17   Renato Shin, MD  spironolactone (ALDACTONE) 50 MG tablet Take 50 mg by mouth daily.    [provider]  torsemide (DEMADEX) 10 MG tablet Take 3 tablets by mouth once daily for swelling and fluid retention. Patient taking differently: Take 20-40 mg by mouth 2 (two) times daily. 40 mg in the morning and 20 mg in the evening 04/04/17   Pleas Koch, NP  traZODone (DESYREL) 50 MG tablet Take 150 mg by mouth at bedtime.     [provider]    Family History Family History  Problem Relation Age of Onset  . Venous thrombosis Brother   . Other Brother        BRAIN TUMOR  . Asthma Father   . Diabetes Father   . Coronary artery disease Mother   . Hypertension Mother   . Diabetes Mother   . Asthma Sister   . Diabetes type II Brother     Social History Social History   Tobacco Use  . Smoking status: Former Smoker    Packs/day: 0.50    Years: 30.00    Pack years: 15.00    Types: Cigarettes    Last attempt to quit: 11/08/2016    Years since quitting: 1.1  . Smokeless tobacco: Never Used  Substance Use Topics  . Alcohol use: No    Frequency: Never  . Drug use: No     Allergies   Metoprolol; Montelukast; Morphine sulfate; Penicillins; Prednisone; Diltiazem; and Gabapentin   Review of Systems Review of Systems  Constitutional: Positive for activity change and fatigue. Negative for appetite change and fever.  Respiratory: Negative for shortness of breath.   Cardiovascular: Negative for chest pain.  Gastrointestinal: Negative for abdominal pain, nausea and vomiting.  Genitourinary: Positive for pelvic pain and vaginal pain. Negative for dysuria.  Musculoskeletal: Negative for arthralgias and myalgias.  Skin: Positive for color change and rash. Negative for wound.  Neurological: Positive for weakness. Negative for dizziness, syncope, light-headedness, numbness and headaches.     Physical Exam Triage Vital Signs ED Triage Vitals  Enc Vitals Group     BP 01/19/18 1315 (!) 98/43     Pulse Rate 01/19/18 1315 93     Resp 01/19/18 1315 20     Temp 01/19/18 1315 98.8 F (37.1 C)     Temp Source 01/19/18 1315 Temporal     SpO2 01/19/18 1315 95 %     Weight --      Height --      Head Circumference --      Peak Flow --      Pain Score 01/19/18 1313 10     Pain Loc --      Pain Edu? --      Excl. in [place]? --    No data found.  Updated Vital Signs BP (!) 98/43 (BP Location: Left Arm)   Pulse 93   Temp 98.8 F (37.1 C) (Temporal)   Resp 20   LMP 11/11/2011   SpO2 95%   Visual Acuity Right Eye Distance:   Left Eye Distance:   Bilateral Distance:    Right Eye Near:   Left Eye Near:    Bilateral Near:     Physical Exam  Constitutional: She appears well-developed and well-nourished. She appears distressed.  Standing up to avoid sitting/pain  HENT:  Head: Normocephalic and atraumatic.  Eyes: Conjunctivae are normal.  Neck: Neck supple.  Cardiovascular: Normal rate and regular rhythm.  No murmur heard. Pulmonary/Chest: Effort normal and breath sounds normal. No respiratory distress.  Abdominal: Soft. There is no tenderness.  Musculoskeletal: She exhibits no edema.  Requires significant assistance to help with balance and standing  Neurological: She is alert.  Skin: Skin is warm and dry. There is erythema.  Significant erythema and induration to lower left mons/pannus area extending to left labia majora, does not seem to extend to the rectum.  Psychiatric: She has a  normal mood and affect.  Nursing note and vitals reviewed.    UC Treatments / Results  Labs (all labs ordered are listed, but only abnormal results are displayed) Labs Reviewed - No data to display  EKG None  Radiology No results found.  Procedures Procedures (including critical care time)  Medications Ordered in UC Medications  ketorolac (TORADOL) injection 60 mg (has no administration in time range)    Initial Impression / Assessment and Plan / UC Course  I have reviewed the triage vital signs and the nursing notes.  Pertinent labs & imaging results that were available during my care of the patient were reviewed by me and considered in my medical decision making (see chart for details).     Patient with cellulitis/panniculitis.  Symptoms concerning for possible sepsis, blood pressure appears to be trending downward, 98/43 today, the past month her blood pressure seems to average around 120/60.  Patient also recently had Tylenol which could be masking a fever/tachycardia.  Patient also in significant pain, uncontrolled diabetic and infection seems to be spreading quickly.  Recommend patient to go to emergency room for further work-up and evaluation.  Initially discussed outpatient treatment, but after further thought and discussion with Dr. Meda Coffee and Dr. Valere Dross recommended emergency  room.  Patient persistent about wanting something for pain before going to emergency room, recently had Tylenol, will provide one-time dose of Toradol.  Patient and husband verbalized understanding, plan to transport her down there. Discussed strict return precautions. Patient verbalized understanding and is agreeable with plan.  Final Clinical Impressions(s) / UC Diagnoses   Final diagnoses:  Panniculitis  Cellulitis of groin     Discharge Instructions     Please go to emergency room for further evaluation   ED Prescriptions    None     Controlled Substance Prescriptions [place] Controlled  Substance Registry consulted? Not Applicable   Janith Lima, [place] 01/19/18 1439

## 2018-01-19 NOTE — ED Provider Notes (Signed)
[facility] EMERGENCY DEPARTMENT Provider Note   CSN: [id number] Arrival date & time: 01/19/18  1457     History   Chief Complaint Chief Complaint  Patient presents with  . Cellulitis    HPI [name] is a 47 y.o. female.  HPI   Patient is a 47 year old female with a history of  COPD, CAD, CHF (last EF 65-70%), CKD, hypertension, hyperlipidemia, DM type II presented for pain and swelling of the lower groin area.  Patient reports that this is been occurring for the past 3 days.  Patient reports has become progressively more painful, and she is attempted putting Neosporin ointment as well as barrier cream on the area.  Patient denies fever or chills.  Patient denies drainage from the site.  Patient denies abdominal pain.  Patient reports that she has had nausea with emesis today.  No obstructive urination, dysuria, urgency, or frequency.  Patient does report loose stools over the last couple days without melena or hematochezia.  Patient denies chest pain, but does report that she feels she is wheezing at present, has not taken her inhaler today.  Past Medical History:  Diagnosis Date  . Anxiety    takes Prozac daily  . Anxiety   . Aortic valve calcification   . Asthma    Advair and Spirva daily  . Asthma   . Bipolar disorder ([place])   . CAD (coronary artery disease)    a. LHC 11/2013 done for CP/fluid retention: mild disease in prox LAD, mild-mod disease in mRCA, EF 60% with normal LVEDP. b. Normal nuc 03/2016.  Marland Kitchen CHF (congestive heart failure) ([place])   . Chronic diastolic CHF (congestive heart failure) ([place])   . CKD (chronic kidney disease), stage II   . COPD (chronic obstructive pulmonary disease) (HCC)    a. nocturnal O2.  Marland Kitchen COPD (chronic obstructive pulmonary disease) ([place])   . Coronary artery disease   . Diabetes mellitus   . Diabetes mellitus without complication ([place])   . Dyspnea   . Family history of adverse reaction to anesthesia    mom gets  nauseated  . GERD (gastroesophageal reflux disease)    takes Pepcid daily  . History of blood clots    left leg 3-46yrs ago  . Hyperlipidemia   . Hypertension   . Hypertriglyceridemia   . Inguinal hernia, left 01/2015  . Muscle spasm   . Peripheral neuropathy   . RBBB   . Seizures ([place])   . Sinus tachycardia    a. persistent since 2009.  Marland Kitchen Smokers' cough ([place])   . Stroke [facility]) 1989   left sided weakness  . TIA (transient ischemic attack)   . Tobacco abuse     Patient Active Problem List   Diagnosis Date Noted  . Chronic pain of both lower extremities (Primary Area of Pain) 01/08/2018  . Chronic bilateral low back pain with bilateral sciatica [facility] Area of Pain) (midline) 01/08/2018  . Chronic pain syndrome 01/08/2018  . Chronic upper extremity pain (Secondary Area of Pain) 01/08/2018  . Pharmacologic therapy 01/08/2018  . Disorder of skeletal system 01/08/2018  . Problems influencing health status 01/08/2018  . Long term current use of opiate analgesic 01/08/2018  . Restless leg syndrome 08/02/2017  . Nocturnal hypoxemia 03/29/2017  . Vision disturbance 02/28/2017  . CKD (chronic kidney disease), stage II 02/28/2017  . Visual loss, bilateral   . Chronic respiratory failure with hypoxia (HCC)/ nocturnal 02 dep  10/28/2016  . Upper airway cough  syndrome 08/22/2016  . Cigarette smoker 06/15/2016  . Simple chronic bronchitis ([place])   . Coronary artery disease involving native heart without angina pectoris 05/16/2016  . Acute on chronic diastolic (congestive) heart failure ([place]) 11/04/2015  . Orthopnea 11/03/2015  . Dyspnea   . Bilateral leg edema   . Diabetes ([place])   . Decreased urine stream   . COPD exacerbation ([place]) 10/15/2015  . Fracture of rib, closed 10/15/2015  . Venous stasis of both lower extremities 03/29/2015  . Preventative health care 02/04/2015  . Essential hypertension 01/02/2015  . Inguinal hernia 11/27/2014  . Allergic rhinitis with postnasal drip  01/21/2014  . Aortic valve disorders 04/18/2013  . Numbness and tingling 04/01/2013  . Sleep apnea 05/22/2012  . Sinus tachycardia 10/09/2011  . Seizure disorder ([place]) 10/09/2011  . Bipolar disorder ([place]) 10/09/2011  . TIA (transient ischemic attack) 06/22/2011  . Constipation 06/15/2011  . Diabetic polyneuropathy ([place]) 06/08/2010  . HYPERTRIGLYCERIDEMIA 07/17/2007  . COPD GOLD 0  05/30/2007  . Morbid (severe) obesity due to excess calories ([place]) 04/23/2007  . Tobacco abuse 09/07/2006  . Asthma, persistent 09/07/2006    Past Surgical History:  Procedure Laterality Date  . HERNIA REPAIR    . INGUINAL HERNIA REPAIR Left 04/08/2015   Procedure: OPEN LEFT INGUINAL HERNIA REPAIR WITH MESH;  Surgeon: Ralene Ok, MD;  Location: [place] [facility];  Service: General;  Laterality: Left;  . INSERTION OF MESH Left 04/08/2015   Procedure: INSERTION OF MESH;  Surgeon: Ralene Ok, MD;  Location: [facility];  Service: General;  Laterality: Left;  . LAPAROSCOPY     Endometriosis  . LEFT HEART CATHETERIZATION WITH CORONARY ANGIOGRAM N/A 12/05/2013   Procedure: LEFT HEART CATHETERIZATION WITH CORONARY ANGIOGRAM;  Surgeon: Jettie Booze, MD;  Location[facility] C[facility] LAB;  Service: Cardiovascular;  Laterality: N/A;  . right kidney drained    . TEE WITHOUT CARDIOVERSION N/A 11/28/2013   Procedure: TRANSESOPHAGEAL ECHOCARDIOGRAM (TEE);  Surgeon: Thayer Headings, MD;  Location: [facility];  Service: Cardiovascular;  Laterality: N/A;     OB History   None      Home Medications    Prior to Admission medications   Medication Sig Start Date End Date Taking? Authorizing Provider  acetaminophen (TYLENOL) 325 MG tablet Take 2 tablets (650 mg total) by mouth every 4 (four) hours as needed for headache or mild pain. 03/19/17   Patwardhan, Reynold Bowen, MD  albuterol (PROVENTIL HFA;VENTOLIN HFA) 108 (90 Base) MCG/ACT inhaler INHALE 2 PUFFS INTO LUNGS EVERY 4 HOURS AS NEEDED FOR SHORTNESS OF BREATH 01/09/18   Pleas Koch, NP  ALPRAZolam Duanne Moron) 1 MG tablet Take 1 mg by mouth 2 (two) times daily.     [provider]  aspirin EC 81 MG tablet Take 81 mg by mouth every morning.    [provider]  budesonide-formoterol (SYMBICORT) 80-4.5 MCG/ACT inhaler INHALE 2 PUFFS BY MOUTH EVERY 12 HOURS TO PREVENT COUGH OR WHEEZING *RINSE MOUTH AFTER USSING* 01/09/18   Pleas Koch, NP  busPIRone (BUSPAR) 30 MG tablet Take 1 tablet (30 mg total) by mouth 2 (two) times daily. 05/05/17   Pleas Koch, NP  FLUoxetine (PROZAC) 40 MG capsule Take 40 mg by mouth daily.    [provider]  glucose blood (ONE TOUCH ULTRA TEST) test strip Used to check blood sugars 4x daily. 08/23/17   Renato Shin, MD  Insulin Pen Needle 32G X 4 MM MISC Used to give insulin injections twice daily. 08/23/17  Renato Shin, MD  insulin regular human CONCENTRATED (HUMULIN R U-500 KWIKPEN) 500 UNIT/ML kwikpen Inject 400 Units into the skin 2 (two) times daily with a meal. Patient taking differently: Inject 175-300 Units into the skin 2 (two) times daily with a meal.  09/01/17   Renato Shin, MD  ipratropium-albuterol (DUONEB) 0.5-2.5 (3) MG/3ML SOLN Take 3 mLs by nebulization every 4 (four) hours as needed (wheezing/shortness of breath). 08/02/17   Pleas Koch, NP  linaclotide [facility]) 290 MCG CAPS capsule Take 1 capsule (290 mcg total) by mouth daily before breakfast. 06/06/17 12/13/17  Jonathon Bellows, MD  LYRICA 300 MG capsule TAKE 1 CAPSULE BY MOUTH TWICE DAILY FOR NEUROPATHY 10/25/17   Pleas Koch, NP  metFORMIN (GLUCOPHAGE) 1000 MG tablet Take 1,000 mg by mouth 2 (two) times daily with a meal.    [provider]  metolazone (ZAROXOLYN) 2.5 MG tablet Take 2.5 mg by mouth every other day. Take 2.5 mg by mouth every 48 hours on Tuesday, Thursday and Saturday    [provider]  nicotine (NICODERM CQ - DOSED IN MG/24 HOURS) 21 mg/24hr patch Place 1 patch (21 mg total) at bedtime onto the  skin. 05/23/17   Pleas Koch, NP  ondansetron (ZOFRAN) 4 MG tablet Take 4 mg by mouth every 6 (six) hours as needed for nausea or vomiting.    [provider]  OXYGEN Inhale 4 L into the lungs at bedtime.     [provider]  PRESCRIPTION MEDICATION Place 1 drop into both eyes daily as needed (every 14 weeks before injections).    [provider]  ranitidine (ZANTAC) 150 MG tablet Take 150 mg by mouth 2 (two) times daily.     [provider]  rOPINIRole (REQUIP) 0.5 MG tablet Take 1 tablet (0.5 mg total) by mouth at bedtime. 08/02/17   Pleas Koch, NP  Semaglutide (OZEMPIC) 0.25 or 0.5 MG/DOSE SOPN Inject 0.5 mg into the skin once a week. 12/06/17   Renato Shin, MD  spironolactone (ALDACTONE) 50 MG tablet Take 50 mg by mouth daily.    [provider]  torsemide (DEMADEX) 10 MG tablet Take 3 tablets by mouth once daily for swelling and fluid retention. Patient taking differently: Take 20-40 mg by mouth 2 (two) times daily. 40 mg in the morning and 20 mg in the evening 04/04/17   Pleas Koch, NP  traZODone (DESYREL) 50 MG tablet Take 150 mg by mouth at bedtime.     [provider]    Family History Family History  Problem Relation Age of Onset  . Venous thrombosis Brother   . Other Brother        BRAIN TUMOR  . Asthma Father   . Diabetes Father   . Coronary artery disease Mother   . Hypertension Mother   . Diabetes Mother   . Asthma Sister   . Diabetes type II Brother     Social History Social History   Tobacco Use  . Smoking status: Former Smoker    Packs/day: 0.50    Years: 30.00    Pack years: 15.00    Types: Cigarettes    Last attempt to quit: 11/08/2016    Years since quitting: 1.1  . Smokeless tobacco: Never Used  Substance Use Topics  . Alcohol use: No    Frequency: Never  . Drug use: No     Allergies   Metoprolol; Montelukast; Morphine sulfate; Penicillins; Prednisone; Diltiazem; and  Gabapentin  Review of Systems Review of Systems  Constitutional: Negative for chills and fever.  Respiratory: Positive for shortness of breath and wheezing. Negative for cough.   Cardiovascular: Positive for leg swelling. Negative for chest pain and palpitations.       Leg swelling bilaterally.  Gastrointestinal: Positive for diarrhea, nausea and vomiting. Negative for abdominal distention and abdominal pain.  Genitourinary: Positive for vaginal pain. Negative for difficulty urinating, dysuria and urgency.  Skin: Positive for color change and rash.  All other systems reviewed and are negative.    Physical Exam Updated Vital Signs BP (!) 102/54 (BP Location: Right Arm)   Pulse 95   Temp (!) 101.2 F (38.4 C)   Resp 20   LMP 11/11/2011   SpO2 96%   Physical Exam  Constitutional: She appears well-developed and well-nourished.  Obese female resting comfortably in bed.  HENT:  Head: Normocephalic and atraumatic.  Mouth/Throat: Oropharynx is clear and moist.  Eyes: Pupils are equal, round, and reactive to light. Conjunctivae and EOM are normal.  Neck: Normal range of motion. Neck supple.  Cardiovascular: Normal rate, regular rhythm, S1 normal and S2 normal.  No murmur heard. Pulmonary/Chest: Effort normal. She has wheezes. She has no rales.  Wheezes in upper lung fields.  Patient converses comfortably and speaking in full sentences.  Abdominal: Soft. She exhibits no distension. There is no tenderness. There is no guarding.  Genitourinary:  Genitourinary Comments: Genitalia examination performed with nurse tech chaperone present.  Patient has diffuse induration of the left labia majora, extending up to the pannus.  There is erythema of bilateral inner thighs.  Does not extend up into the abdomen.  There is no active drainage or focus of fluctuance.  Musculoskeletal: Normal range of motion. She exhibits no edema or deformity.  Lymphadenopathy:    She has no cervical adenopathy.    Neurological: She is alert.  Cranial nerves grossly intact. Patient moves extremities symmetrically and with good coordination.  Skin: Skin is warm and dry. No rash noted. No erythema.  Psychiatric: She has a normal mood and affect. Her behavior is normal. Judgment and thought content normal.  Nursing note and vitals reviewed.    ED Treatments / Results  Labs (all labs ordered are listed, but only abnormal results are displayed) Labs Reviewed  COMPREHENSIVE METABOLIC PANEL - Abnormal; Notable for the following components:      Result Value   Sodium 124 (*)    Chloride 79 (*)    Glucose, Bld 442 (*)    BUN 60 (*)    Creatinine, Ser 2.18 (*)    Calcium 8.7 (*)    Albumin 3.2 (*)    Total Bilirubin 1.3 (*)    GFR calc non Af Amer 26 (*)    GFR calc Af Amer 30 (*)    Anion gap 17 (*)    All other components within normal limits  CBC WITH DIFFERENTIAL/PLATELET - Abnormal; Notable for the following components:   WBC 24.6 (*)    Hemoglobin 11.7 (*)    RDW 17.7 (*)    Neutro Abs 21.4 (*)    Monocytes Absolute 1.4 (*)    Abs Immature Granulocytes 0.5 (*)    All other components within normal limits  I-STAT CG4 LACTIC ACID, ED - Abnormal; Notable for the following components:   Lactic Acid, Venous 3.54 (*)    All other components within normal limits  I-STAT BETA HCG BLOOD, ED ([facility], [facility], [facility] ONLY) - Abnormal; Notable for the  following components:   I-stat hCG, quantitative 21.4 (*)    All other components within normal limits  CULTURE, BLOOD (ROUTINE X 2)  CULTURE, BLOOD (ROUTINE X 2)  URINALYSIS, ROUTINE W REFLEX MICROSCOPIC  POC URINE PREG, ED  I-STAT CG4 LACTIC ACID, ED    EKG None  Radiology Dg Chest 2 View  Result Date: 01/19/2018 CLINICAL DATA:  Pelvic infection.  No chest complaints. EXAM: CHEST - 2 VIEW COMPARISON:  Chest x-ray dated October 19, 2017. FINDINGS: The heart remains at the upper limits of normal in size. Normal pulmonary vascularity. Mild chronic  bronchitic changes are again noted. Unchanged eventration of the right hemidiaphragm. Linear atelectasis in the right middle lobe. No focal consolidation, pleural effusion, or pneumothorax. No acute osseous abnormality. IMPRESSION: Chronic bronchitic changes.  No active cardiopulmonary disease. Electronically Signed   By: Titus Dubin M.D.   On: 01/19/2018 15:59    Procedures Procedures (including critical care time)  CRITICAL CARE Performed by: Albesa Seen   Total critical care time: 35 minutes  Critical care time was exclusive of separately billable procedures and treating other patients.  Critical care was necessary to treat or prevent imminent or life-threatening deterioration.  Critical care was time spent personally by me on the following activities: development of treatment plan with patient and/or surrogate as well as nursing, discussions with consultants, evaluation of patient's response to treatment, examination of patient, obtaining history from patient or surrogate, ordering and performing treatments and interventions, ordering and review of laboratory studies, ordering and review of radiographic studies, pulse oximetry and re-evaluation of patient's condition.  Medications Ordered in ED Medications  sodium chloride 0.9 % bolus 1,000 mL (has no administration in time range)    And  sodium chloride 0.9 % bolus 1,000 mL (has no administration in time range)  acetaminophen (TYLENOL) tablet 650 mg (has no administration in time range)  fentaNYL (SUBLIMAZE) injection 50 mcg (has no administration in time range)     Initial Impression / Assessment and Plan / ED Course  I have reviewed the triage vital signs and the nursing notes.  Pertinent labs & imaging results that were available during my care of the patient were reviewed by me and considered in my medical decision making (see chart for details).  Clinical Course as of Jan 19 2310  Fri Jan 19, 2018  1634 Code  sepsis ordered. Suspect panniculitis is source. Fluid ordered off of ideal body weight.   [AM]  4431 Patient has not menstruated in 5 years.  Feel that this is false positive.   blood ordering urine pregnancy.  I-stat hCG, quantitative(!): 21.4 [AM]  5400 Feel that anion gap secondary to lactic acidosis.  Do not feel that it is due to DKA.  Bicarb is 28.  Anion gap(!): 17 [AM]  1637 Lactic less than 4, however given that patient's blood pressures are soft, will order bolus based off of ideal body weight.  Lactic Acid, Venous(!!): 3.54 [AM]  1637 Tylenol ordered for antipyresis.  Temp(!): 101.2 F (38.4 C) [AM]  1637 Creatinine elevated.  Suspect AKI secondary to sepsis.  Will order CT pelvis without contrast.  Creatinine(!): 2.18 [AM]  1700 Likely hyponatremia in the setting of hyperglycemia.  Sodium(!): 124 [AM]  1727 Improving.  Lactic Acid, Venous(!!): 3.06 [AM]  1734 Reassessed. Pox 98%   [AM]  8676 Spoke with Dr. Si Raider who will admit the patient.  Appreciate his involvement in the care of this patient.   [AM]  Clinical Course User Index [AM] Albesa Seen, PA-C    Patient is critically ill and requiring a higher level of care. Sepsis suspected. Code sepsis called. Patient meeting SIRS criteria based on tachypnea, heart rate, fever, leukocytosis, and lactic acid level. See vitals below. Suspected source of infection panniculitis based on exam. Lactic acid 3.54->3.06. Signs of end organ dysfunction include renal dysfunction (see below).  Vitals:   01/19/18 1930 01/19/18 2000 01/19/18 2015 01/19/18 2107  BP: 121/78 106/71 106/66 123/70  Pulse: 88 89 85 88  Resp: 16   16  Temp:    98.2 F (36.8 C)  TempSrc:    Oral  SpO2: 94% 94% 93% 98%  Weight:      Height:        Lab Results  Component Value Date   CREATININE 2.18 (H) 01/19/2018   CREATININE 1.32 (H) 01/08/2018   CREATININE 1.55 (H) 12/26/2017   Broad spectrum antibiotics initiated based on suspected source of  infection. Bolus NS ordered based on ideal body weight.   Spoke with hospitalist Dr. Si Raider who has decided to admit patient to a high level of care.   Sepsis - Repeat Assessment  Performed at:    1800  Vitals     Blood pressure 104/66, pulse 93, temperature (!) 101.2 F (38.4 C), resp. rate 20, height 5\' 4"  (1.626 m), weight 131.5 kg (290 lb), last menstrual period 11/11/2011, SpO2 98%  Heart:     Regular rate and rhythm  Lungs:    Wheezing, consistent with prior exam  Capillary Refill:   <2 sec  Peripheral Pulse:   Dorsalis pedis pulse  palpable  Skin:     Normal Color   This is a shared visit with Dr. Elnora Morrison. Patient was independently evaluated by this attending physician. Attending physician consulted in evaluation and admission management.   Final Clinical Impressions(s) / ED Diagnoses   Final diagnoses:  Panniculitis  Severe sepsis [facility])  Wheezing    ED Discharge Orders    None       Tamala Julian 01/19/18 2312    Elnora Morrison, MD 01/19/18 2354

## 2018-01-19 NOTE — ED Triage Notes (Signed)
Pt presents with rash between legs and knot on vaginal area.

## 2018-01-19 NOTE — Progress Notes (Signed)
Pharmacy Antibiotic Note  [name] is a 47 y.o. female admitted on 01/19/2018 with sepsis and cellulitis.    Plan: Metronidazole 500 mg q8h Aztreonam 1 g q8h Vanc 1500 mg q24h Monitor renal fx cx vt prn     Temp (24hrs), Avg:99.3 F (37.4 C), Min:98 F (36.7 C), Max:101.2 F (38.4 C)  Recent Labs  Lab 01/19/18 1526 01/19/18 1535  WBC 24.6*  --   CREATININE 2.18*  --   LATICACIDVEN  --  3.54*    Estimated Creatinine Clearance: 42.5 mL/min (A) (by C-G formula based on SCr of 2.18 mg/dL (H)).    Allergies  Allergen Reactions  . Metoprolol Shortness Of Breath    Occurrence of shortness of breath after 3 days  . Montelukast Shortness Of Breath  . Morphine Sulfate Shortness Of Breath and Nausea And Vomiting    Swollen Throat - Able to tolerate dilaudid  . Penicillins Hives and Shortness Of Breath    Swelling on throat Has patient had a PCN reaction causing immediate rash, facial/tongue/throat swelling, SOB or lightheadedness with hypotension: Yes Has patient had a PCN reaction causing severe rash involving mucus membranes or skin necrosis: No Has patient had a PCN reaction that required hospitalization Yes Has patient had a PCN reaction occurring within the last 10 years: No If all of the above answers are "NO", then may proceed with Cephalosporin use.   . Prednisone Anaphylaxis  . Diltiazem Swelling  . Gabapentin Swelling   Levester Fresh, PharmD, BCPS, BCCCP Clinical Pharmacist [phone]  Please check AMION for all [place] numbers  01/19/2018 4:39 PM

## 2018-01-19 NOTE — H&P (Addendum)
History and Physical    [name] XTK:[id number] DOB: 1971-03-29 DOA: 01/19/2018  PCP: Pleas Koch, NP  Patient coming from: home   Chief Complaint: vulvar pain  HPI: [name] is a 47 y.o. female with medical history significant for poorly controlled resistant t2dm, morbid obesity, copd with possible superimposed obesity hypoventilation syndrome, chronic o2 (2L during the day, 4 at night), osa on nightly cpcp, htn, dCHF, CAD (per chart normal nuclear stress 2017, cath 2015 without intervention), bipolar disorder, and tobacco abuse, who presents with the above.  Over the course of the past 4 days has developed pain and redness and swelling over left groin. Has had nausea with one episode vomiting. Denies fevers/chills. No back pain. No change in baseline dyspnea; no cough. No dysuria. No increased urinary frequency. Nothing draining from the wound. Says is compliant with bid concentrated insulin. Says history rash and breathing problems with penicillin.  Denies hx gestational trophoblastic disease.  Presented to outside urgent care this afternoon with the above complaints and was transferred to our ED for further evaluation  ED Course: 2 L NS, aztreonam/flagyl/vancomycin, labs, CT abd/pelvis  Review of Systems: As per HPI otherwise 10 point review of systems negative.    Past Medical History:  Diagnosis Date  . Anxiety    takes Prozac daily  . Anxiety   . Aortic valve calcification   . Asthma    Advair and Spirva daily  . Asthma   . Bipolar disorder ([place])   . CAD (coronary artery disease)    a. LHC 11/2013 done for CP/fluid retention: mild disease in prox LAD, mild-mod disease in mRCA, EF 60% with normal LVEDP. b. Normal nuc 03/2016.  Marland Kitchen CHF (congestive heart failure) ([place])   . Chronic diastolic CHF (congestive heart failure) ([place])   . CKD (chronic kidney disease), stage II   . COPD (chronic obstructive pulmonary disease) (H[facility])    a. nocturnal O2.  Marland Kitchen COPD (chronic  obstructive pulmonary disease) ([place])   . Coronary artery disease   . Diabetes mellitus   . Diabetes mellitus without complication ([place])   . Dyspnea   . Family history of adverse reaction to anesthesia    mom gets nauseated  . GERD (gastroesophageal reflux disease)    takes Pepcid daily  . History of blood clots    left leg 3-1yrs ago  . Hyperlipidemia   . Hypertension   . Hypertriglyceridemia   . Inguinal hernia, left 01/2015  . Muscle spasm   . Peripheral neuropathy   . RBBB   . Seizures ([place])   . Sinus tachycardia    a. persistent since 2009.  Marland Kitchen Smokers' cough ([place])   . Stroke [facility]) 1989   left sided weakness  . TIA (transient ischemic attack)   . Tobacco abuse     Past Surgical History:  Procedure Laterality Date  . HERNIA REPAIR    . INGUINAL HERNIA REPAIR Left 04/08/2015   Procedure: OPEN LEFT INGUINAL HERNIA REPAIR WITH MESH;  Surgeon: Ralene Ok, MD;  Location: [place];  Service: General;  Laterality: Left;  . INSERTION OF MESH Left 04/08/2015   Procedure: INSERTION OF MESH;  Surgeon: Ralene Ok, MD;  Location: [place];  Service: General;  Laterality: Left;  . LAPAROSCOPY     Endometriosis  . LEFT HEART CATHETERIZATION WITH CORONARY ANGIOGRAM N/A 12/05/2013   Procedure: LEFT HEART CATHETERIZATION WITH CORONARY ANGIOGRAM;  Surgeon: Jettie Booze, MD;  Location: [facility] CATH LAB;  Service: Cardiovascular;  Laterality: N/A;  .  right kidney drained    . TEE WITHOUT CARDIOVERSION N/A 11/28/2013   Procedure: TRANSESOPHAGEAL ECHOCARDIOGRAM (TEE);  Surgeon: Thayer Headings, MD;  Location: [facility] ENDOSCOPY;  Service: Cardiovascular;  Laterality: N/A;     reports that she quit smoking about 14 months ago. Her smoking use included cigarettes. She has a 15.00 pack-year smoking history. She has never used smokeless tobacco. She reports that she does not drink alcohol or use drugs.  Allergies  Allergen Reactions  . Adhesive [Tape] Rash and Other (See Comments)    TAKES OFF  THE SKIN (CERTAIN MEDICAL TAPES DO THIS!!)  . Metoprolol Shortness Of Breath    Occurrence of shortness of breath after 3 days  . Montelukast Shortness Of Breath  . Morphine Sulfate Anaphylaxis, Shortness Of Breath and Nausea And Vomiting    Swollen Throat - Able to tolerate dilaudid  . Penicillins Anaphylaxis, Hives and Shortness Of Breath    Throat swells Has patient had a PCN reaction causing immediate rash, facial/tongue/throat swelling, SOB or lightheadedness with hypotension: Yes Has patient had a PCN reaction causing severe rash involving mucus membranes or skin necrosis: No Has patient had a PCN reaction that required hospitalization: Yes Has patient had a PCN reaction occurring within the last 10 years: No If all of the above answers are "NO", then may proceed with Cephalosporin use.   . Prednisone Anaphylaxis  . Diltiazem Swelling  . Gabapentin Swelling    Family History  Problem Relation Age of Onset  . Venous thrombosis Brother   . Other Brother        BRAIN TUMOR  . Asthma Father   . Diabetes Father   . Coronary artery disease Mother   . Hypertension Mother   . Diabetes Mother   . Asthma Sister   . Diabetes type II Brother     Prior to Admission medications   Medication Sig Start Date End Date Taking? Authorizing Provider  acetaminophen (TYLENOL) 325 MG tablet Take 2 tablets (650 mg total) by mouth every 4 (four) hours as needed for headache or mild pain. 03/19/17  Yes Patwardhan, Manish J, MD  albuterol (PROVENTIL HFA;VENTOLIN HFA) 108 (90 Base) MCG/ACT inhaler INHALE 2 PUFFS INTO LUNGS EVERY 4 HOURS AS NEEDED FOR SHORTNESS OF BREATH 01/09/18  Yes Pleas Koch, NP  ALPRAZolam Duanne Moron) 1 MG tablet Take 1 mg by mouth 3 (three) times daily as needed for anxiety.    Yes [provider]  budesonide-formoterol (SYMBICORT) 80-4.5 MCG/ACT inhaler INHALE 2 PUFFS BY MOUTH EVERY 12 HOURS TO PREVENT COUGH OR WHEEZING *RINSE MOUTH AFTER USSING* 01/09/18  Yes Pleas Koch, NP  busPIRone (BUSPAR) 30 MG tablet Take 1 tablet (30 mg total) by mouth 2 (two) times daily. 05/05/17  Yes Pleas Koch, NP  insulin regular human CONCENTRATED (HUMULIN R U-500 KWIKPEN) 500 UNIT/ML kwikpen Inject 400 Units into the skin 2 (two) times daily with a meal. Patient taking differently: Inject 200-500 Units into the skin 2 (two) times daily before a meal.  09/01/17  Yes Renato Shin, MD  ipratropium-albuterol (DUONEB) 0.5-2.5 (3) MG/3ML SOLN Take 3 mLs by nebulization every 4 (four) hours as needed (wheezing/shortness of breath). 08/02/17  Yes Pleas Koch, NP  linaclotide [facility]) 290 MCG CAPS capsule Take 1 capsule (290 mcg total) by mouth daily before breakfast. 06/06/17 01/19/18 Yes Jonathon Bellows, MD  LYRICA 300 MG capsule TAKE 1 CAPSULE BY MOUTH TWICE DAILY FOR NEUROPATHY 10/25/17  Yes Pleas Koch, NP  rOPINIRole (  REQUIP) 0.5 MG tablet Take 1 tablet (0.5 mg total) by mouth at bedtime. 08/02/17  Yes Pleas Koch, NP  Semaglutide (OZEMPIC) 0.25 or 0.5 MG/DOSE SOPN Inject 0.5 mg into the skin once a week. 12/06/17  Yes Renato Shin, MD  torsemide (DEMADEX) 10 MG tablet Take 3 tablets by mouth once daily for swelling and fluid retention. Patient taking differently: Take 20-40 mg by mouth 2 (two) times daily. 40 mg in the morning and 20 mg in the evening 04/04/17  Yes Pleas Koch, NP  aspirin EC 81 MG tablet Take 81 mg by mouth every morning.    [provider]  FLUoxetine (PROZAC) 40 MG capsule Take 40 mg by mouth daily.    [provider]  glucose blood (ONE TOUCH ULTRA TEST) test strip Used to check blood sugars 4x daily. 08/23/17   Renato Shin, MD  Insulin Pen Needle 32G X 4 MM MISC Used to give insulin injections twice daily. 08/23/17   Renato Shin, MD  metFORMIN (GLUCOPHAGE) 1000 MG tablet Take 1,000 mg by mouth 2 (two) times daily with a meal.    [provider]  metolazone (ZAROXOLYN) 2.5 MG tablet Take 2.5 mg by  mouth every other day. Take 2.5 mg by mouth every 48 hours on Tuesday, Thursday and Saturday    [provider]  nicotine (NICODERM CQ - DOSED IN MG/24 HOURS) 21 mg/24hr patch Place 1 patch (21 mg total) at bedtime onto the skin. Patient not taking: Reported on 01/19/2018 05/23/17   Pleas Koch, NP  ondansetron (ZOFRAN) 4 MG tablet Take 4 mg by mouth every 6 (six) hours as needed for nausea or vomiting.    [provider]  OXYGEN Inhale 4 L into the lungs at bedtime.     [provider]  PRESCRIPTION MEDICATION Place 1 drop into both eyes daily as needed (every 14 weeks before injections).    [provider]  ranitidine (ZANTAC) 150 MG tablet Take 150 mg by mouth 2 (two) times daily.     [provider]  spironolactone (ALDACTONE) 50 MG tablet Take 50 mg by mouth daily.    [provider]  traZODone (DESYREL) 50 MG tablet Take 150 mg by mouth at bedtime.     [provider]    Physical Exam: Vitals:   01/19/18 1740 01/19/18 1800 01/19/18 1830 01/19/18 1848  BP: 99/63 (!) 121/99 (!) 123/110   Pulse: 88 88 95   Resp: 19 (!) 24 16   Temp:    99.9 F (37.7 C)  TempSrc:    Oral  SpO2: 99% 100% 97%   Weight:      Height:        Constitutional: No acute distress Head: Atraumatic Eyes: Conjunctiva clear ENM: Moist mucous membranes. Poor dentition.  Neck: Supple Respiratory: Faint scattered wheezes with respiration, otherwise clear Cardiovascular: Regular rate and rhythm. Mild systolic murmur, no rubs/gallops. Abdomen: soft, non-tender. Obese.   Musculoskeletal: No joint deformity upper and lower extremities. Normal ROM, no contractures. Normal muscle tone.  Skin: mild/mod pitting edema to mid knee. Left vulva/mons pubis extending to groin and llq ill-defined area of erythema and induration and tenderness Extremities: palpable peripheral pulses. warm Neurologic: Alert, moving all 4 extremities. Psychiatric: Normal  insight and judgement.   Labs on Admission: I have personally reviewed following labs and imaging studies  CBC: Recent Labs  Lab 01/19/18 1526  WBC 24.6*  NEUTROABS 21.4*  HGB 11.7*  HCT 36.3  MCV 83.8  PLT 710   Basic Metabolic Panel: Recent Labs  Lab 01/19/18 1526  NA 124*  K 3.7  CL 79*  CO2 28  GLUCOSE 442*  BUN 60*  CREATININE 2.18*  CALCIUM 8.7*   GFR: Estimated Creatinine Clearance: 42.5 mL/min (A) (by C-G formula based on SCr of 2.18 mg/dL (H)). Liver Function Tests: Recent Labs  Lab 01/19/18 1526  AST 28  ALT 25  ALKPHOS 118  BILITOT 1.3*  PROT 8.0  ALBUMIN 3.2*   No results for input(s): LIPASE, AMYLASE in the last 168 hours. No results for input(s): AMMONIA in the last 168 hours. Coagulation Profile: No results for input(s): INR, PROTIME in the last 168 hours. Cardiac Enzymes: No results for input(s): CKTOTAL, CKMB, CKMBINDEX, TROPONINI in the last 168 hours. BNP (last 3 results) No results for input(s): PROBNP in the last 8760 hours. HbA1C: No results for input(s): HGBA1C in the last 72 hours. CBG: No results for input(s): GLUCAP in the last 168 hours. Lipid Profile: No results for input(s): CHOL, HDL, LDLCALC, TRIG, CHOLHDL, LDLDIRECT in the last 72 hours. Thyroid Function Tests: No results for input(s): TSH, T4TOTAL, FREET4, T3FREE, THYROIDAB in the last 72 hours. Anemia Panel: No results for input(s): VITAMINB12, FOLATE, FERRITIN, TIBC, IRON, RETICCTPCT in the last 72 hours. Urine analysis:    Component Value Date/Time   COLORURINE STRAW (A) 07/19/2017 1942   APPEARANCEUR CLEAR 07/19/2017 1942   LABSPEC 1.005 07/19/2017 1942   PHURINE 5.0 07/19/2017 1942   GLUCOSEU >=500 (A) 07/19/2017 1942   HGBUR SMALL (A) 07/19/2017 1942   HGBUR negative 03/07/2008 1643   BILIRUBINUR NEGATIVE 07/19/2017 1942   BILIRUBINUR NEG 11/03/2015 1035   KETONESUR NEGATIVE 07/19/2017 1942   PROTEINUR NEGATIVE 07/19/2017 1942   UROBILINOGEN 0.2  11/03/2015 1035   UROBILINOGEN 0.2 10/08/2011 1850   NITRITE NEGATIVE 07/19/2017 1942   LEUKOCYTESUR NEGATIVE 07/19/2017 1942    Radiological Exams on Admission: Dg Chest 2 View  Result Date: 01/19/2018 CLINICAL DATA:  Pelvic infection.  No chest complaints. EXAM: CHEST - 2 VIEW COMPARISON:  Chest x-ray dated October 19, 2017. FINDINGS: The heart remains at the upper limits of normal in size. Normal pulmonary vascularity. Mild chronic bronchitic changes are again noted. Unchanged eventration of the right hemidiaphragm. Linear atelectasis in the right middle lobe. No focal consolidation, pleural effusion, or pneumothorax. No acute osseous abnormality. IMPRESSION: Chronic bronchitic changes.  No active cardiopulmonary disease. Electronically Signed   By: Titus Dubin M.D.   On: 01/19/2018 15:59   Ct Pelvis Wo Contrast  Result Date: 01/19/2018 CLINICAL DATA:  Rash of the left side of the groin. Lump of the vagina. EXAM: CT PELVIS WITHOUT CONTRAST TECHNIQUE: Multidetector CT imaging of the pelvis was performed following the standard protocol without intravenous contrast. COMPARISON:  03/05/2010 FINDINGS: Urinary Tract: No bladder abnormality seen. Distal ureters appear negative. Inferior renal poles appear negative. Bowel: Sigmoid diverticulosis without evidence of diverticulitis. No other bowel finding. Vascular/Lymphatic: Atherosclerosis of the aorta and the iliac arteries. No aneurysm. Reproductive: No uterine or adnexal mass or inflammatory change. No abnormality of the vagina identified. Other: Edema and swelling of the mons pubis on the left and the lower left abdominal panniculus consistent with cellulitis. No evidence of drainable deep abscess. Musculoskeletal: Ordinary lower lumbar degenerative changes. Other bones of the region appear normal. IMPRESSION: Swelling and edema of the mons pubis and the lower left abdominal panniculus on the left consistent with cellulitis. No evidence of drainable  abscess. Electronically  Signed   By: Nelson Chimes M.D.   On: 01/19/2018 17:32    EKG: Independently reviewed. Rbb, no ischemic changes  Assessment/Plan Active Problems:   Morbid (severe) obesity due to excess calories (HCC)   Tobacco abuse   COPD GOLD 0    Seizure disorder (HCC)   Bipolar disorder (HCC)   Sleep apnea   Essential hypertension   Diabetes (HCC)   Cellulitis   IBS (irritable bowel syndrome)   Sepsis ([place])   # Left mons pubis cellulitis # Sepsis - febrile with elevated wbcs and elevated LA on admission. BP also low normal. Is now s/p 2 L NS fluid resuscitation. Vitals now wnl and mentating clearly and LA now wnl so think stable for floor. pcn-allergic sepsis abx protocol started. CT showing no drainable abscess, no signs necrotizing infection. - continue aztreonam/metronidazole/vancomycin - iv fluids nacl 125/hr - monitor closely for signs necrotizing infection - glucose control as below  # Uncontrolled T2DM - with mildly elevated gap of 17, bicarb wnl and ph wnl. No overt symptoms DKA. S/p fluid resuscitation as above - continue humalin 4 400 units bid - add SSI if sugars do not improve - fluids as above - f/u vbg - bmp in AM - hold home metformin  # AKI on ckd - cr 2.18 from baseline ~1.5. Likely prerenal from sepsis - fluids as above, am bmp - hold home metolazone and torsemide and spironolactone for now  # elevated hcg - says abstinent for 4-5 years. Is mildly elevated. ddx if persists includes early pregnancy if abstinence not believed, normal variation in putiary production,quiescent gestational trophoblastic disease, and other neoplasm. No hx gestational trophoblastic disease so far as the patient knows. - will repeat hcg in AM to confirm, proceed from there  # OSA on cpap # chronic hypoxia  - continue home  o2 (says on 2L during day and 4 at night) and nightly cpap  # dCHF # HTN - bp low normal initially, now wnl. No signs chf exacerbation - hold  home metolazone and torsemide and spironolactone in setting of sepsis/aki, resume when safe to do so  # COPD # Obesity hypoventilation syndrome (possible) - stable - cpap/o2 as above - continue home albuterol prn, symbicort bid  # Anxiety - continue home alprazolam, buspar, fluoxetine  # chronic pain - continue home gabapentin  # IBS - continue home linaclotide    DVT prophylaxis: heparin Code Status: dnr  Family Communication: husband herbert Stemmer  Disposition Plan: tbd  Consults called: none  Admission status: med/surg    Desma Maxim MD [facility] Pager [phone]  If 7PM-7AM, please contact night-coverage [email or web address] Password [email or web address]  01/19/2018, 8:16 PM

## 2018-01-19 NOTE — Discharge Instructions (Signed)
Please go to emergency room for further evaluation.

## 2018-01-19 NOTE — Progress Notes (Signed)
Placed patient on CPAP via nasal mask, auto settings ( max 20.0 min 6.0) cm H20 with 3 lpm O2 bleed in. Tolerating well at this time. RN aware.

## 2018-01-19 NOTE — ED Notes (Signed)
Pt in XRAY 

## 2018-01-19 NOTE — Telephone Encounter (Addendum)
Electronically refill request both medications  Last prescribed on 08/02/2017  Last office visit on 12/13/2017

## 2018-01-19 NOTE — Telephone Encounter (Signed)
Continue with CPAP, and check ONO on CPAP. It's not unusual to feel that the pressure is too high at first, try to keep at it.

## 2018-01-20 DIAGNOSIS — L03315 Cellulitis of perineum: Secondary | ICD-10-CM

## 2018-01-20 DIAGNOSIS — L03818 Cellulitis of other sites: Secondary | ICD-10-CM

## 2018-01-20 LAB — BASIC METABOLIC PANEL
Anion gap: 14 (ref 5–15)
BUN: 56 mg/dL — ABNORMAL HIGH (ref 6–20)
CO2: 28 mmol/L (ref 22–32)
Calcium: 8.6 mg/dL — ABNORMAL LOW (ref 8.9–10.3)
Chloride: 85 mmol/L — ABNORMAL LOW (ref 98–111)
Creatinine, Ser: 1.77 mg/dL — ABNORMAL HIGH (ref 0.44–1.00)
GFR calc Af Amer: 38 mL/min — ABNORMAL LOW (ref >60)
GFR calc non Af Amer: 33 mL/min — ABNORMAL LOW (ref >60)
Glucose, Bld: 516 mg/dL (ref 70–99)
Potassium: 3.6 mmol/L (ref 3.5–5.1)
Sodium: 127 mmol/L — ABNORMAL LOW (ref 135–145)

## 2018-01-20 LAB — HEMOGLOBIN A1C
Hgb A1c MFr Bld: 10.4 % — ABNORMAL HIGH (ref 4.8–5.6)
Mean Plasma Glucose: 251.78 mg/dL

## 2018-01-20 LAB — CBC
HCT: 34.2 % — ABNORMAL LOW (ref 36.0–46.0)
Hemoglobin: 11.1 g/dL — ABNORMAL LOW (ref 12.0–15.0)
MCH: 27.1 pg (ref 26.0–34.0)
MCHC: 32.5 g/dL (ref 30.0–36.0)
MCV: 83.6 fL (ref 78.0–100.0)
Platelets: 168 10*3/uL (ref 150–400)
RBC: 4.09 MIL/uL (ref 3.87–5.11)
RDW: 17.7 % — ABNORMAL HIGH (ref 11.5–15.5)
WBC: 20.9 10*3/uL — ABNORMAL HIGH (ref 4.0–10.5)

## 2018-01-20 LAB — LACTIC ACID, PLASMA: Lactic Acid, Venous: 1.8 mmol/L (ref 0.5–1.9)

## 2018-01-20 LAB — GLUCOSE, CAPILLARY
Glucose-Capillary: 511 mg/dL (ref 70–99)
Glucose-Capillary: 478 mg/dL — ABNORMAL HIGH (ref 70–99)
Glucose-Capillary: 500 mg/dL — ABNORMAL HIGH (ref 70–99)
Glucose-Capillary: 453 mg/dL — ABNORMAL HIGH (ref 70–99)
Glucose-Capillary: 489 mg/dL — ABNORMAL HIGH (ref 70–99)

## 2018-01-20 LAB — HCG, SERUM, QUALITATIVE: Preg, Serum: NEGATIVE

## 2018-01-20 MED ORDER — INSULIN ASPART 100 UNIT/ML ~~LOC~~ SOLN
0.0000 [IU] | Freq: Three times a day (TID) | SUBCUTANEOUS | Status: DC
  Administered 2018-01-20 – 2018-01-21 (×3): 15 [IU] via SUBCUTANEOUS
  Administered 2018-01-21: 25 [IU] via SUBCUTANEOUS

## 2018-01-20 MED ORDER — KETOROLAC TROMETHAMINE 30 MG/ML IJ SOLN
30.0000 mg | Freq: Four times a day (QID) | INTRAMUSCULAR | Status: DC
  Administered 2018-01-20 – 2018-01-22 (×8): 30 mg via INTRAVENOUS
  Filled 2018-01-20 (×8): qty 1

## 2018-01-20 MED ORDER — INSULIN GLARGINE 100 UNIT/ML ~~LOC~~ SOLN
20.0000 [IU] | Freq: Every day | SUBCUTANEOUS | Status: DC
  Administered 2018-01-20: 20 [IU] via SUBCUTANEOUS
  Filled 2018-01-20 (×2): qty 0.2

## 2018-01-20 MED ORDER — INSULIN ASPART 100 UNIT/ML ~~LOC~~ SOLN
15.0000 [IU] | Freq: Once | SUBCUTANEOUS | Status: AC
  Administered 2018-01-20: 15 [IU] via SUBCUTANEOUS

## 2018-01-20 NOTE — Progress Notes (Signed)
Pt blood sugar still high in 500s since 0400 although 15 units of insulin has been given already at 0524, pt is sleeping, alert x 3, attending doctor notified, got an order to keep monitoring for now, will continue to monitor Pt.

## 2018-01-20 NOTE — Progress Notes (Signed)
Pt. Refused CPAP. She requested it to be taken off, on  4L Oxygen now, pt has been educated, will continue to monitor.

## 2018-01-20 NOTE — Progress Notes (Signed)
Patient family member refused, did not want patient woken up at this time, no distress noted RCP will continue to follow.

## 2018-01-20 NOTE — Progress Notes (Signed)
[facility]                                                                              Patient Demographics  [name], is a 47 y.o. female, DOB - [birth year], GGE:[id number]  Admit date - 01/19/2018   Admitting Physician Gwynne Edinger, MD  Outpatient Primary MD for the patient is Pleas Koch, NP  Outpatient specialists:   LOS - 1  days    Chief Complaint  Patient presents with  . Cellulitis       Brief summary   [name] is a 47 y.o. female with medical history significant for but not limited to poorly controlled resistant t2dm, morbid obesity, copd with possible superimposed obesity hypoventilation syndrome, on chronic O 2 (2L during the day, 4 at night), osa on nightly cpcp, htn, dCHF, and CAD  Presenting with 4 day history of vulvar pain,  And limited for uncontrolled diabetes with mons pubis cellulitis and secondary sepsis.  She was given 2 L of normal saline and antibiotics initiated at the ED prior to her admission     Assessment & Plan    Active Problems:   Morbid (severe) obesity due to excess calories (HCC)   Tobacco abuse   COPD GOLD 0    Seizure disorder (HCC)   Bipolar disorder (HCC)   Sleep apnea   Essential hypertension   Diabetes (HCC)   Cellulitis   IBS (irritable bowel syndrome)   Sepsis (HCC)   Elevated serum hCG    1. Sepsis:  source- left mons pubis cellulitis   Febrile with leukocytosis, lactic acidosis, and borderline hypotension  CT abdomen and pelvis 01/19/2018- mons pubis swelling/edema with left lower abdominal  Panniculitis- no abscess penicillin allergic-  Continue aztreonam / metronidazole/vancomycin  continue supportive care for IV fluids   2. Left Mons pubis cellulitis:  antibiotics, pain control, glycemic control to enhance healing   3. Uncontrolled type 2 diabetes mellitus:   Optimize glycemic control with monitoring of sugar levels -hold metformin  continue IV fluids   4. Acute on  chronic kidney injury:  Creatinine up from baseline 1.5 to 2.18 on admission  due to sepsis  diuretics on hold -metolazone, torsemide and Aldactone  5. elevated HCG:  has not had sex for 4-5 years now  follow clinically -  Consider outpatient workup if persistent   6. COPD / OSA:   Supportive care, p.r.n. Albuterol   7. Chronic diastolic CHF:  Stable Diuretics on hold in view of sepsis of acute kidney injury a    Code Status:  DNR DVT Prophylaxis: heparin  Family Communication: Discussed in detail with the patient, all imaging results, lab results explained to the patient or *   Disposition Plan:  home  Time Spent in minutes 31 minutes  Procedures:    Consultants:    Antimicrobials:      Medications  Scheduled Meds: . busPIRone  30 mg Oral BID  . famotidine  20 mg Oral Daily  . FLUoxetine  40 mg Oral Daily  . heparin  5,000 Units Subcutaneous Q8H  . insulin regular human CONCENTRATED  400 Units  Subcutaneous BID WC  . linaclotide  290 mcg Oral QAC breakfast  . mometasone-formoterol  2 puff Inhalation BID  . pregabalin  300 mg Oral BID  . traZODone  150 mg Oral QHS   Continuous Infusions: . 0.9 % NaCl with KCl 20 mEq / L 125 mL/hr at 01/20/18 0846  . aztreonam 1 g (01/20/18 0132)  . metronidazole 500 mg (01/20/18 0211)  . vancomycin Stopped (01/19/18 2104)   PRN Meds:.albuterol, ALPRAZolam   Antibiotics   Anti-infectives (From admission, onward)   Start     Dose/Rate Route Frequency Ordered Stop   01/19/18 1800  vancomycin (VANCOCIN) 1,500 mg in sodium chloride 0.9 % 500 mL IVPB     1,500 mg 250 mL/hr over 120 Minutes Intravenous Every 24 hours 01/19/18 1639     01/19/18 1700  aztreonam (AZACTAM) 1 g in sodium chloride 0.9 % 100 mL IVPB     1 g 200 mL/hr over 30 Minutes Intravenous Every 8 hours 01/19/18 1639     01/19/18 1700  metroNIDAZOLE (FLAGYL) IVPB 500 mg     500 mg 100 mL/hr over 60 Minutes Intravenous Every 8 hours 01/19/18 1639           Subjective:   [name] was seen and examined today.  Complains of pain.  Patient denies dizziness, chest pain, shortness of breath, abdominal pain, N/V/D/C  Objective:   Vitals:   01/19/18 2015 01/19/18 2107 01/19/18 2309 01/20/18 0407  BP: 106/66 123/70  128/63  Pulse: 85 88 82 99  Resp:  16 16 17   Temp:  98.2 F (36.8 C)  98.4 F (36.9 C)  TempSrc:  Oral  Oral  SpO2: 93% 98% 95% 96%  Weight:      Height:        Intake/Output Summary (Last 24 hours) at 01/20/2018 0908 Last data filed at 01/20/2018 0410 Gross per 24 hour  Intake 1440 ml  Output -  Net 1440 ml     Wt Readings from Last 3 Encounters:  01/19/18 131.5 kg (290 lb)  01/08/18 131.5 kg (290 lb)  12/21/17 127.9 kg (282 lb)     Exam  General: NAD  HEENT: NCAT,  PERRL,MMM  Neck: SUPPLE, (-) JVD  Cardiovascular: RRR, (-) GALLOP, (-) MURMUR  Respiratory: CTA  Gastrointestinal: SOFT, (-) DISTENSION, BS(+), (_) TENDERNESS  Ext: (-) CYANOSIS, (-) EDEMA  Neuro: A, OX 3  Skin: erythema/induration without fluctuance, with tenderness, involving left mons previous     Data Reviewed:  I have personally reviewed following labs and imaging studies  Micro Results No results found for this or any previous visit (from the past 240 hour(s)).  Radiology Reports Dg Chest 2 View  Result Date: 01/19/2018 CLINICAL DATA:  Pelvic infection.  No chest complaints. EXAM: CHEST - 2 VIEW COMPARISON:  Chest x-ray dated October 19, 2017. FINDINGS: The heart remains at the upper limits of normal in size. Normal pulmonary vascularity. Mild chronic bronchitic changes are again noted. Unchanged eventration of the right hemidiaphragm. Linear atelectasis in the right middle lobe. No focal consolidation, pleural effusion, or pneumothorax. No acute osseous abnormality. IMPRESSION: Chronic bronchitic changes.  No active cardiopulmonary disease. Electronically Signed   By: Titus Dubin M.D.   On: 01/19/2018 15:59   Dg  Lumbar Spine Complete W/bend  Result Date: 01/15/2018 CLINICAL DATA:  Chronic lumbago with bilateral radicular symptoms EXAM: LUMBAR SPINE - COMPLETE WITH BENDING VIEWS COMPARISON:  None. FINDINGS: Frontal, neutral lateral, flexion lateral, extension lateral, spot lumbosacral  lateral, and bilateral oblique-total 7-views obtained. There are 5 non-rib-bearing lumbar type vertebral bodies. Bony mineralization is within normal limits. There is no fracture. There is no spondylolisthesis on neutral lateral imaging. There is no change in lateral alignment with flexion-extension. Patient shows essentially normal range of motion with flexion and extension. The disc spaces in the lumbar region appear normal. There are anterior osteophytes at T10, T11, T12, and L1. There is facet osteoarthritic change at L3-4, L4-5, and L5-S1 on the left. Other facets appear unremarkable. There is aortic atherosclerosis. IMPRESSION: Areas of facet osteoarthritic change on the left. No fracture or spondylolisthesis. No appreciable change in lateral alignment with flexion-extension. No appreciable disc space narrowing in the lumbar regions. There is aortic atherosclerosis. Aortic Atherosclerosis (ICD10-I70.0). Electronically Signed   By: Lowella Grip III M.D.   On: 01/15/2018 14:03   Ct Pelvis Wo Contrast  Result Date: 01/19/2018 CLINICAL DATA:  Rash of the left side of the groin. Lump of the vagina. EXAM: CT PELVIS WITHOUT CONTRAST TECHNIQUE: Multidetector CT imaging of the pelvis was performed following the standard protocol without intravenous contrast. COMPARISON:  03/05/2010 FINDINGS: Urinary Tract: No bladder abnormality seen. Distal ureters appear negative. Inferior renal poles appear negative. Bowel: Sigmoid diverticulosis without evidence of diverticulitis. No other bowel finding. Vascular/Lymphatic: Atherosclerosis of the aorta and the iliac arteries. No aneurysm. Reproductive: No uterine or adnexal mass or inflammatory  change. No abnormality of the vagina identified. Other: Edema and swelling of the mons pubis on the left and the lower left abdominal panniculus consistent with cellulitis. No evidence of drainable deep abscess. Musculoskeletal: Ordinary lower lumbar degenerative changes. Other bones of the region appear normal. IMPRESSION: Swelling and edema of the mons pubis and the lower left abdominal panniculus on the left consistent with cellulitis. No evidence of drainable abscess. Electronically Signed   By: Nelson Chimes M.D.   On: 01/19/2018 17:32    Lab Data:  CBC: Recent Labs  Lab 01/19/18 1526 01/20/18 0336  WBC 24.6* 20.9*  NEUTROABS 21.4*  --   HGB 11.7* 11.1*  HCT 36.3 34.2*  MCV 83.8 83.6  PLT 186 811   Basic Metabolic Panel: Recent Labs  Lab 01/19/18 1526 01/20/18 0336  NA 124* 127*  K 3.7 3.6  CL 79* 85*  CO2 28 28  GLUCOSE 442* 516*  BUN 60* 56*  CREATININE 2.18* 1.77*  CALCIUM 8.7* 8.6*   GFR: Estimated Creatinine Clearance: 51.8 mL/min (A) (by C-G formula based on SCr of 1.77 mg/dL (H)). Liver Function Tests: Recent Labs  Lab 01/19/18 1526  AST 28  ALT 25  ALKPHOS 118  BILITOT 1.3*  PROT 8.0  ALBUMIN 3.2*   No results for input(s): LIPASE, AMYLASE in the last 168 hours. No results for input(s): AMMONIA in the last 168 hours. Coagulation Profile: No results for input(s): INR, PROTIME in the last 168 hours. Cardiac Enzymes: No results for input(s): CKTOTAL, CKMB, CKMBINDEX, TROPONINI in the last 168 hours. BNP (last 3 results) No results for input(s): PROBNP in the last 8760 hours. HbA1C: Recent Labs    01/20/18 0336  HGBA1C 10.4*   CBG: Recent Labs  Lab 01/19/18 2109 01/20/18 0609 01/20/18 0838  GLUCAP 474* 511* 478*   Lipid Profile: No results for input(s): CHOL, HDL, LDLCALC, TRIG, CHOLHDL, LDLDIRECT in the last 72 hours. Thyroid Function Tests: No results for input(s): TSH, T4TOTAL, FREET4, T3FREE, THYROIDAB in the last 72 hours. Anemia  Panel: No results for input(s): VITAMINB12, FOLATE, FERRITIN, TIBC, IRON, RETICCTPCT  in the last 72 hours. Urine analysis:    Component Value Date/Time   COLORURINE AMBER (A) 01/19/2018 2312   APPEARANCEUR TURBID (A) 01/19/2018 2312   LABSPEC 1.013 01/19/2018 2312   PHURINE 5.0 01/19/2018 2312   GLUCOSEU 150 (A) 01/19/2018 2312   HGBUR LARGE (A) 01/19/2018 2312   HGBUR negative 03/07/2008 1643   BILIRUBINUR NEGATIVE 01/19/2018 2312   BILIRUBINUR NEG 11/03/2015 1035   KETONESUR NEGATIVE 01/19/2018 2312   PROTEINUR 100 (A) 01/19/2018 2312   UROBILINOGEN 0.2 11/03/2015 1035   UROBILINOGEN 0.2 10/08/2011 1850   NITRITE NEGATIVE 01/19/2018 2312   LEUKOCYTESUR MODERATE (A) 01/19/2018 2312     Benito Mccreedy M.D. [facility] 01/20/2018, 9:08 AM  Pager: [phone] Between 7am to 7pm - call Pager - [phone]  After 7pm go to [email or web address] - password [email or web address]  Call night coverage person covering after 7pm

## 2018-01-21 DIAGNOSIS — L03314 Cellulitis of groin: Secondary | ICD-10-CM

## 2018-01-21 LAB — COMPREHENSIVE METABOLIC PANEL
ALT: 27 U/L (ref 0–44)
AST: 22 U/L (ref 15–41)
Albumin: 2.7 g/dL — ABNORMAL LOW (ref 3.5–5.0)
Alkaline Phosphatase: 138 U/L — ABNORMAL HIGH (ref 38–126)
Anion gap: 12 (ref 5–15)
BUN: 55 mg/dL — ABNORMAL HIGH (ref 6–20)
CO2: 23 mmol/L (ref 22–32)
Calcium: 8.3 mg/dL — ABNORMAL LOW (ref 8.9–10.3)
Chloride: 95 mmol/L — ABNORMAL LOW (ref 98–111)
Creatinine, Ser: 1.72 mg/dL — ABNORMAL HIGH (ref 0.44–1.00)
GFR calc Af Amer: 39 mL/min — ABNORMAL LOW (ref >60)
GFR calc non Af Amer: 34 mL/min — ABNORMAL LOW (ref >60)
Glucose, Bld: 488 mg/dL — ABNORMAL HIGH (ref 70–99)
Potassium: 4.1 mmol/L (ref 3.5–5.1)
Sodium: 130 mmol/L — ABNORMAL LOW (ref 135–145)
Total Bilirubin: 1.2 mg/dL (ref 0.3–1.2)
Total Protein: 7.5 g/dL (ref 6.5–8.1)

## 2018-01-21 LAB — GLUCOSE, CAPILLARY
Glucose-Capillary: 475 mg/dL — ABNORMAL HIGH (ref 70–99)
Glucose-Capillary: 470 mg/dL — ABNORMAL HIGH (ref 70–99)
Glucose-Capillary: 530 mg/dL (ref 70–99)
Glucose-Capillary: 553 mg/dL (ref 70–99)
Glucose-Capillary: 592 mg/dL (ref 70–99)

## 2018-01-21 LAB — CBC
HCT: 33.8 % — ABNORMAL LOW (ref 36.0–46.0)
Hemoglobin: 10.4 g/dL — ABNORMAL LOW (ref 12.0–15.0)
MCH: 26.9 pg (ref 26.0–34.0)
MCHC: 30.8 g/dL (ref 30.0–36.0)
MCV: 87.3 fL (ref 78.0–100.0)
Platelets: 167 10*3/uL (ref 150–400)
RBC: 3.87 MIL/uL (ref 3.87–5.11)
RDW: 18.1 % — ABNORMAL HIGH (ref 11.5–15.5)
WBC: 20.8 10*3/uL — ABNORMAL HIGH (ref 4.0–10.5)

## 2018-01-21 MED ORDER — HYDROMORPHONE HCL 1 MG/ML IJ SOLN: 1.0000 mg | Freq: Once | INTRAMUSCULAR | Status: DC

## 2018-01-21 MED ORDER — INSULIN REGULAR HUMAN (CONC) 500 UNIT/ML ~~LOC~~ SOPN
400.0000 [IU] | PEN_INJECTOR | Freq: Two times a day (BID) | SUBCUTANEOUS | Status: DC
  Administered 2018-01-21: 400 [IU] via SUBCUTANEOUS
  Filled 2018-01-21 (×2): qty 3

## 2018-01-21 NOTE — Progress Notes (Signed)
Interdry fabric applied to bilateral groin area to wick up moisture.

## 2018-01-21 NOTE — Progress Notes (Signed)
Pt. CBG at 2010 is 592, asymptomatic, attending doctor notified,will continue to monitor.

## 2018-01-21 NOTE — Progress Notes (Signed)
blood sugar 470, MD notified, ordered 25 units of insulin

## 2018-01-21 NOTE — Progress Notes (Signed)
Patient refused CPAP at this time, no distress noted RCP will continue to follow.

## 2018-01-21 NOTE — Progress Notes (Signed)
MD has been text paged x2 with family and patient concerns about pain management and blood sugar levels, today blood sugars have been 470 and 530. Patient stated they needed to talk with physician about concerns. Awaiting response.

## 2018-01-21 NOTE — Progress Notes (Signed)
MD has spoken with family member and patient about blood sugar and pain medication concerns. MD looked at left labia area, per MD verbal order place gauze around area in order to wick up any moisture. Area cleansed, barrier powder applied, fan in place, and ice. MD verbally ordered wound care consult, RN placed order. Will continue to monitor patient.

## 2018-01-21 NOTE — Progress Notes (Signed)
CRITICAL VALUE ALERT  Critical Value:  530  Date & Time Notied:  01/21/18,  11:40am  Provider Notified: Dr. Vista Lawman  Orders Received/Actions taken: Give patient 15 units of insulin, and increase IV fluids to 142mL/hr

## 2018-01-21 NOTE — Progress Notes (Signed)
Patient's husband was trying to move patient up in bed, while doing so patient scratched left arm on bed. Left arm was assessed, cleansed, and covered with pink foam.

## 2018-01-21 NOTE — Progress Notes (Signed)
[facility]                                                                              Patient Demographics  [name], is a 47 y.o. female, DOB - 1971/04/05, VFI:[id number]  Admit date - 01/19/2018   Admitting Physician Debbe Odea, MD  Outpatient Primary MD for the patient is Pleas Koch, NP  Outpatient specialists:   LOS - 2  days    Chief Complaint  Patient presents with  . Cellulitis       Brief summary   [name] is a 47 y.o. female with medical history significant for but not limited to poorly controlled resistant t2dm, morbid obesity, copd with possible superimposed obesity hypoventilation syndrome, on chronic O 2 (2L during the day, 4 at night), osa on nightly cpcp, htn, dCHF, and CAD  Presenting with 4 day history of vulvar pain,  And limited for uncontrolled diabetes with mons pubis cellulitis and secondary sepsis.  She was given 2 L of normal saline and antibiotics initiated at the ED prior to her admission     Assessment & Plan    Active Problems:   Morbid (severe) obesity due to excess calories (HCC)   Tobacco abuse   COPD GOLD 0    Seizure disorder (HCC)   Bipolar disorder (HCC)   Sleep apnea   Essential hypertension   Diabetes (HCC)   Cellulitis   IBS (irritable bowel syndrome)   Sepsis (HCC)   Elevated serum hCG    1. Sepsis:  source- left mons pubis cellulitis   Febrile with leukocytosis, lactic acidosis, and borderline hypotension  CT abdomen and pelvis 01/19/2018- mons pubis swelling/edema with left lower abdominal  Panniculitis- no abscess penicillin allergic-  Continue aztreonam / metronidazole/vancomycin  continue supportive care for IV fluids   2. Left Mons pubis cellulitis:  antibiotics, pain control, glycemic control to enhance healing   3. Uncontrolled type 2 diabetes mellitus:   Optimize glycemic control with monitoring of sugar levels -hold metformin  continue IV fluids   4. Acute on chronic  kidney injury:  Creatinine up from baseline 1.5 to 2.18 on admission  due to sepsis  diuretics on hold -metolazone, torsemide and Aldactone  5. elevated HCG:  has not had sex for 4-5 years now  follow clinically -  Consider outpatient workup if persistent   6. COPD / OSA:   Supportive care, p.r.n. Albuterol   7. Chronic diastolic CHF:  Stable Diuretics on hold in view of sepsis of acute kidney injury a    Code Status:  DNR DVT Prophylaxis: heparin  Family Communication: Discussed in detail with the patient, all imaging results, lab results explained to the patient or *   Disposition Plan:  home  Time Spent in minutes 31 minutes  Procedures:    Consultants:    Antimicrobials:      Medications  Scheduled Meds: . busPIRone  30 mg Oral BID  . famotidine  20 mg Oral Daily  . FLUoxetine  40 mg Oral Daily  . heparin  5,000 Units Subcutaneous Q8H  . insulin aspart  0-15 Units Subcutaneous TID WC  .  insulin glargine  20 Units Subcutaneous QHS  . ketorolac  30 mg Intravenous Q6H  . linaclotide  290 mcg Oral QAC breakfast  . mometasone-formoterol  2 puff Inhalation BID  . pregabalin  300 mg Oral BID  . traZODone  150 mg Oral QHS   Continuous Infusions: . 0.9 % NaCl with KCl 20 mEq / L 125 mL/hr at 01/21/18 0927  . aztreonam 1 g (01/21/18 0923)  . metronidazole 500 mg (01/21/18 0934)  . vancomycin 1,500 mg (01/20/18 1756)   PRN Meds:.albuterol, ALPRAZolam   Antibiotics   Anti-infectives (From admission, onward)   Start     Dose/Rate Route Frequency Ordered Stop   01/19/18 1800  vancomycin (VANCOCIN) 1,500 mg in sodium chloride 0.9 % 500 mL IVPB     1,500 mg 250 mL/hr over 120 Minutes Intravenous Every 24 hours 01/19/18 1639     01/19/18 1700  aztreonam (AZACTAM) 1 g in sodium chloride 0.9 % 100 mL IVPB     1 g 200 mL/hr over 30 Minutes Intravenous Every 8 hours 01/19/18 1639     01/19/18 1700  metroNIDAZOLE (FLAGYL) IVPB 500 mg     500 mg 100 mL/hr over 60  Minutes Intravenous Every 8 hours 01/19/18 1639          Subjective:   [name] was seen and examined today.  Complains of pain though better, but unable to take narcs  Patient denies dizziness, chest pain, shortness of breath, abdominal pain, N/V/D/C  Objective:   Vitals:   01/20/18 1342 01/20/18 1935 01/21/18 0429 01/21/18 0917  BP: 122/65 112/70 109/71   Pulse: 88 82 85   Resp: 18 18 18    Temp: 99 F (37.2 C) 98.3 F (36.8 C) 98.9 F (37.2 C)   TempSrc: Oral Oral Oral   SpO2: 97% 100% 97% 94%  Weight:      Height:        Intake/Output Summary (Last 24 hours) at 01/21/2018 1126 Last data filed at 01/20/2018 1300 Gross per 24 hour  Intake 120 ml  Output -  Net 120 ml     Wt Readings from Last 3 Encounters:  01/19/18 131.5 kg (290 lb)  01/08/18 131.5 kg (290 lb)  12/21/17 127.9 kg (282 lb)     Exam  General: NAD  HEENT: NCAT,  PERRL,MMM  Neck: SUPPLE, (-) JVD  Cardiovascular: RRR, (-) GALLOP, (-) MURMUR  Respiratory: CTA  Gastrointestinal: SOFT, (-) DISTENSION, BS(+), (_) TENDERNESS  Ext: (-) CYANOSIS, (-) EDEMA  Neuro: A, OX 3  Skin: erythema/induration without fluctuance, with tenderness, involving left mons previous     Data Reviewed:  I have personally reviewed following labs and imaging studies  Micro Results Recent Results (from the past 240 hour(s))  Blood Culture (routine x 2)     Status: None (Preliminary result)   Collection Time: 01/19/18  4:26 PM  Result Value Ref Range Status   Specimen Description BLOOD SITE NOT SPECIFIED  Final   Special Requests   Final    BOTTLES DRAWN AEROBIC AND ANAEROBIC Blood Culture adequate volume   Culture   Final    NO GROWTH < 24 HOURS Performed at [facility], [address], [place], [place] [address]    Report Status PENDING  Incomplete  Blood Culture (routine x 2)     Status: None (Preliminary result)   Collection Time: 01/19/18  4:31 PM  Result Value Ref Range Status    Specimen Description BLOOD BLOOD RIGHT  FOREARM  Final   Special Requests   Final    BOTTLES DRAWN AEROBIC AND ANAEROBIC Blood Culture adequate volume   Culture   Final    NO GROWTH < 24 HOURS Performed at [facility], [address] [place], [place] [address]    Report Status PENDING  Incomplete    Radiology Reports Dg Chest 2 View  Result Date: 01/19/2018 CLINICAL DATA:  Pelvic infection.  No chest complaints. EXAM: CHEST - 2 VIEW COMPARISON:  Chest x-ray dated October 19, 2017. FINDINGS: The heart remains at the upper limits of normal in size. Normal pulmonary vascularity. Mild chronic bronchitic changes are again noted. Unchanged eventration of the right hemidiaphragm. Linear atelectasis in the right middle lobe. No focal consolidation, pleural effusion, or pneumothorax. No acute osseous abnormality. IMPRESSION: Chronic bronchitic changes.  No active cardiopulmonary disease. Electronically Signed   By: Titus Dubin M.D.   On: 01/19/2018 15:59   Dg Lumbar Spine Complete W/bend  Result Date: 01/15/2018 CLINICAL DATA:  Chronic lumbago with bilateral radicular symptoms EXAM: LUMBAR SPINE - COMPLETE WITH BENDING VIEWS COMPARISON:  None. FINDINGS: Frontal, neutral lateral, flexion lateral, extension lateral, spot lumbosacral lateral, and bilateral oblique-total 7-views obtained. There are 5 non-rib-bearing lumbar type vertebral bodies. Bony mineralization is within normal limits. There is no fracture. There is no spondylolisthesis on neutral lateral imaging. There is no change in lateral alignment with flexion-extension. Patient shows essentially normal range of motion with flexion and extension. The disc spaces in the lumbar region appear normal. There are anterior osteophytes at T10, T11, T12, and L1. There is facet osteoarthritic change at L3-4, L4-5, and L5-S1 on the left. Other facets appear unremarkable. There is aortic atherosclerosis. IMPRESSION: Areas of facet osteoarthritic change on  the left. No fracture or spondylolisthesis. No appreciable change in lateral alignment with flexion-extension. No appreciable disc space narrowing in the lumbar regions. There is aortic atherosclerosis. Aortic Atherosclerosis (ICD10-I70.0). Electronically Signed   By: Lowella Grip III M.D.   On: 01/15/2018 14:03   Ct Pelvis Wo Contrast  Result Date: 01/19/2018 CLINICAL DATA:  Rash of the left side of the groin. Lump of the vagina. EXAM: CT PELVIS WITHOUT CONTRAST TECHNIQUE: Multidetector CT imaging of the pelvis was performed following the standard protocol without intravenous contrast. COMPARISON:  03/05/2010 FINDINGS: Urinary Tract: No bladder abnormality seen. Distal ureters appear negative. Inferior renal poles appear negative. Bowel: Sigmoid diverticulosis without evidence of diverticulitis. No other bowel finding. Vascular/Lymphatic: Atherosclerosis of the aorta and the iliac arteries. No aneurysm. Reproductive: No uterine or adnexal mass or inflammatory change. No abnormality of the vagina identified. Other: Edema and swelling of the mons pubis on the left and the lower left abdominal panniculus consistent with cellulitis. No evidence of drainable deep abscess. Musculoskeletal: Ordinary lower lumbar degenerative changes. Other bones of the region appear normal. IMPRESSION: Swelling and edema of the mons pubis and the lower left abdominal panniculus on the left consistent with cellulitis. No evidence of drainable abscess. Electronically Signed   By: Nelson Chimes M.D.   On: 01/19/2018 17:32    Lab Data:  CBC: Recent Labs  Lab 01/19/18 1526 01/20/18 0336 01/21/18 0317  WBC 24.6* 20.9* 20.8*  NEUTROABS 21.4*  --   --   HGB 11.7* 11.1* 10.4*  HCT 36.3 34.2* 33.8*  MCV 83.8 83.6 87.3  PLT 186 168 416   Basic Metabolic Panel: Recent Labs  Lab 01/19/18 1526 01/20/18 0336 01/21/18 0317  NA 124* 127* 130*  K  3.7 3.6 4.1  CL 79* 85* 95*  CO2 28 28 23   GLUCOSE 442* 516* 488*  BUN 60*  56* 55*  CREATININE 2.18* 1.77* 1.72*  CALCIUM 8.7* 8.6* 8.3*   GFR: Estimated Creatinine Clearance: 53.3 mL/min (A) (by C-G formula based on SCr of 1.72 mg/dL (H)). Liver Function Tests: Recent Labs  Lab 01/19/18 1526 01/21/18 0317  AST 28 22  ALT 25 27  ALKPHOS 118 138*  BILITOT 1.3* 1.2  PROT 8.0 7.5  ALBUMIN 3.2* 2.7*   No results for input(s): LIPASE, AMYLASE in the last 168 hours. No results for input(s): AMMONIA in the last 168 hours. Coagulation Profile: No results for input(s): INR, PROTIME in the last 168 hours. Cardiac Enzymes: No results for input(s): CKTOTAL, CKMB, CKMBINDEX, TROPONINI in the last 168 hours. BNP (last 3 results) No results for input(s): PROBNP in the last 8760 hours. HbA1C: Recent Labs    01/20/18 0336  HGBA1C 10.4*   CBG: Recent Labs  Lab 01/20/18 1208 01/20/18 1631 01/20/18 2127 01/21/18 0611 01/21/18 0845  GLUCAP 500* 453* 489* 475* 470*   Lipid Profile: No results for input(s): CHOL, HDL, LDLCALC, TRIG, CHOLHDL, LDLDIRECT in the last 72 hours. Thyroid Function Tests: No results for input(s): TSH, T4TOTAL, FREET4, T3FREE, THYROIDAB in the last 72 hours. Anemia Panel: No results for input(s): VITAMINB12, FOLATE, FERRITIN, TIBC, IRON, RETICCTPCT in the last 72 hours. Urine analysis:    Component Value Date/Time   COLORURINE AMBER (A) 01/19/2018 2312   APPEARANCEUR TURBID (A) 01/19/2018 2312   LABSPEC 1.013 01/19/2018 2312   PHURINE 5.0 01/19/2018 2312   GLUCOSEU 150 (A) 01/19/2018 2312   HGBUR LARGE (A) 01/19/2018 2312   HGBUR negative 03/07/2008 1643   BILIRUBINUR NEGATIVE 01/19/2018 2312   BILIRUBINUR NEG 11/03/2015 1035   KETONESUR NEGATIVE 01/19/2018 2312   PROTEINUR 100 (A) 01/19/2018 2312   UROBILINOGEN 0.2 11/03/2015 1035   UROBILINOGEN 0.2 10/08/2011 1850   NITRITE NEGATIVE 01/19/2018 2312   LEUKOCYTESUR MODERATE (A) 01/19/2018 2312     Benito Mccreedy M.D. [facility] 01/21/2018, 11:26 AM  Pager:  [phone] Between 7am to 7pm - call Pager - [phone]  After 7pm go to [email or web address] - password [email or web address]  Call night coverage person covering after 7pm

## 2018-01-21 NOTE — Progress Notes (Signed)
Patient was on her CPAP HS. 4L O2 bleed in needed. Patient able to take herself on and off.

## 2018-01-22 ENCOUNTER — Inpatient Hospital Stay (HOSPITAL_COMMUNITY): Payer: BLUE CROSS/BLUE SHIELD | Admitting: Anesthesiology

## 2018-01-22 ENCOUNTER — Ambulatory Visit: Payer: BLUE CROSS/BLUE SHIELD | Admitting: Primary Care

## 2018-01-22 ENCOUNTER — Encounter (HOSPITAL_COMMUNITY): Payer: Self-pay | Admitting: Nephrology

## 2018-01-22 ENCOUNTER — Encounter (HOSPITAL_COMMUNITY)
Admission: EM | Disposition: A | Discharge status: Nursing Home (Includes ICF) | Payer: Self-pay | Source: Home / Self Care | Attending: Internal Medicine

## 2018-01-22 DIAGNOSIS — Z515 Encounter for palliative care: Secondary | ICD-10-CM

## 2018-01-22 DIAGNOSIS — S3140XD Unspecified open wound of vagina and vulva, subsequent encounter: Secondary | ICD-10-CM

## 2018-01-22 DIAGNOSIS — Z7189 Other specified counseling: Secondary | ICD-10-CM

## 2018-01-22 DIAGNOSIS — J449 Chronic obstructive pulmonary disease, unspecified: Secondary | ICD-10-CM

## 2018-01-22 DIAGNOSIS — G4730 Sleep apnea, unspecified: Secondary | ICD-10-CM

## 2018-01-22 DIAGNOSIS — L03311 Cellulitis of abdominal wall: Secondary | ICD-10-CM

## 2018-01-22 DIAGNOSIS — E0801 Diabetes mellitus due to underlying condition with hyperosmolarity with coma: Secondary | ICD-10-CM

## 2018-01-22 HISTORY — PX: INCISION AND DRAINAGE PERIRECTAL ABSCESS: SHX1804

## 2018-01-22 LAB — COMPREHENSIVE METABOLIC PANEL
ALT: 28 U/L (ref 0–44)
AST: 25 U/L (ref 15–41)
Albumin: 2.4 g/dL — ABNORMAL LOW (ref 3.5–5.0)
Alkaline Phosphatase: 167 U/L — ABNORMAL HIGH (ref 38–126)
Anion gap: 12 (ref 5–15)
BUN: 56 mg/dL — ABNORMAL HIGH (ref 6–20)
CO2: 22 mmol/L (ref 22–32)
Calcium: 8.2 mg/dL — ABNORMAL LOW (ref 8.9–10.3)
Chloride: 97 mmol/L — ABNORMAL LOW (ref 98–111)
Creatinine, Ser: 1.71 mg/dL — ABNORMAL HIGH (ref 0.44–1.00)
GFR calc Af Amer: 39 mL/min — ABNORMAL LOW (ref >60)
GFR calc non Af Amer: 34 mL/min — ABNORMAL LOW (ref >60)
Glucose, Bld: 381 mg/dL — ABNORMAL HIGH (ref 70–99)
Potassium: 4 mmol/L (ref 3.5–5.1)
Sodium: 131 mmol/L — ABNORMAL LOW (ref 135–145)
Total Bilirubin: 1.1 mg/dL (ref 0.3–1.2)
Total Protein: 7.2 g/dL (ref 6.5–8.1)

## 2018-01-22 LAB — CBC
HCT: 33.1 % — ABNORMAL LOW (ref 36.0–46.0)
Hemoglobin: 10.2 g/dL — ABNORMAL LOW (ref 12.0–15.0)
MCH: 26.6 pg (ref 26.0–34.0)
MCHC: 30.8 g/dL (ref 30.0–36.0)
MCV: 86.4 fL (ref 78.0–100.0)
Platelets: 183 10*3/uL (ref 150–400)
RBC: 3.83 MIL/uL — ABNORMAL LOW (ref 3.87–5.11)
RDW: 18.1 % — ABNORMAL HIGH (ref 11.5–15.5)
WBC: 19.9 10*3/uL — ABNORMAL HIGH (ref 4.0–10.5)

## 2018-01-22 LAB — POCT I-STAT 7, (LYTES, BLD GAS, ICA,H+H)
Acid-base deficit: 4 mmol/L — ABNORMAL HIGH (ref 0.0–2.0)
Bicarbonate: 23.8 mmol/L (ref 20.0–28.0)
Calcium, Ion: 1.17 mmol/L (ref 1.15–1.40)
HCT: 33 % — ABNORMAL LOW (ref 36.0–46.0)
Hemoglobin: 11.2 g/dL — ABNORMAL LOW (ref 12.0–15.0)
O2 Saturation: 90 %
Potassium: 4 mmol/L (ref 3.5–5.1)
Sodium: 134 mmol/L — ABNORMAL LOW (ref 135–145)
TCO2: 25 mmol/L (ref 22–32)
pCO2 arterial: 52.2 mmHg — ABNORMAL HIGH (ref 32.0–48.0)
pH, Arterial: 7.267 — ABNORMAL LOW (ref 7.350–7.450)
pO2, Arterial: 69 mmHg — ABNORMAL LOW (ref 83.0–108.0)

## 2018-01-22 LAB — GLUCOSE, CAPILLARY
Glucose-Capillary: 400 mg/dL — ABNORMAL HIGH (ref 70–99)
Glucose-Capillary: 272 mg/dL — ABNORMAL HIGH (ref 70–99)
Glucose-Capillary: 270 mg/dL — ABNORMAL HIGH (ref 70–99)
Glucose-Capillary: 267 mg/dL — ABNORMAL HIGH (ref 70–99)
Glucose-Capillary: 238 mg/dL — ABNORMAL HIGH (ref 70–99)
Glucose-Capillary: 205 mg/dL — ABNORMAL HIGH (ref 70–99)
Glucose-Capillary: 137 mg/dL — ABNORMAL HIGH (ref 70–99)

## 2018-01-22 LAB — VANCOMYCIN, TROUGH: Vancomycin Tr: 17 ug/mL (ref 15–20)

## 2018-01-22 LAB — SURGICAL PCR SCREEN
MRSA, PCR: NEGATIVE
Staphylococcus aureus: NEGATIVE

## 2018-01-22 SURGERY — INCISION AND DRAINAGE, ABSCESS, PERIRECTAL
Anesthesia: General | Site: Perineum

## 2018-01-22 MED ORDER — SUCCINYLCHOLINE CHLORIDE 20 MG/ML IJ SOLN
INTRAMUSCULAR | Status: DC | PRN
  Administered 2018-01-22: 140 mg via INTRAVENOUS

## 2018-01-22 MED ORDER — FENTANYL CITRATE (PF) 100 MCG/2ML IJ SOLN: 25.0000 ug | INTRAMUSCULAR | Status: DC | PRN

## 2018-01-22 MED ORDER — MEPERIDINE HCL 50 MG/ML IJ SOLN: 6.2500 mg | INTRAMUSCULAR | Status: DC | PRN

## 2018-01-22 MED ORDER — MIDAZOLAM HCL 5 MG/5ML IJ SOLN
INTRAMUSCULAR | Status: DC | PRN
  Administered 2018-01-22: 2 mg via INTRAVENOUS

## 2018-01-22 MED ORDER — INSULIN REGULAR HUMAN (CONC) 500 UNIT/ML ~~LOC~~ SOPN: 400.0000 [IU] | PEN_INJECTOR | Freq: Two times a day (BID) | SUBCUTANEOUS | Status: DC

## 2018-01-22 MED ORDER — PROPOFOL 10 MG/ML IV BOLUS
INTRAVENOUS | Status: DC | PRN
  Administered 2018-01-22: 150 mg via INTRAVENOUS

## 2018-01-22 MED ORDER — SODIUM CHLORIDE 0.9 % IV SOLN
INTRAVENOUS | Status: DC | PRN
  Administered 2018-01-22: 18:00:00 via INTRAVENOUS

## 2018-01-22 MED ORDER — ONDANSETRON HCL 4 MG/2ML IJ SOLN
INTRAMUSCULAR | Status: AC
  Filled 2018-01-22: qty 2

## 2018-01-22 MED ORDER — HYDROMORPHONE HCL 1 MG/ML IJ SOLN
1.0000 mg | INTRAMUSCULAR | Status: DC | PRN
  Administered 2018-01-23: 1 mg via INTRAVENOUS
  Filled 2018-01-22: qty 1

## 2018-01-22 MED ORDER — BUPIVACAINE-EPINEPHRINE (PF) 0.5% -1:200000 IJ SOLN
INTRAMUSCULAR | Status: DC | PRN
  Administered 2018-01-22: 30 mL

## 2018-01-22 MED ORDER — ONDANSETRON HCL 4 MG/2ML IJ SOLN: 4.0000 mg | Freq: Once | INTRAMUSCULAR | Status: DC | PRN

## 2018-01-22 MED ORDER — INSULIN REGULAR HUMAN (CONC) 500 UNIT/ML ~~LOC~~ SOPN
170.0000 [IU] | PEN_INJECTOR | Freq: Two times a day (BID) | SUBCUTANEOUS | Status: DC
  Administered 2018-01-24: 270 [IU] via SUBCUTANEOUS
  Administered 2018-01-27: 170 [IU] via SUBCUTANEOUS
  Administered 2018-01-27: 220 [IU] via SUBCUTANEOUS
  Administered 2018-01-29 – 2018-02-01 (×3): 170 [IU] via SUBCUTANEOUS
  Administered 2018-02-03: 125 [IU] via SUBCUTANEOUS
  Administered 2018-02-03: 170 [IU] via SUBCUTANEOUS
  Administered 2018-02-05: 120 [IU] via SUBCUTANEOUS
  Filled 2018-01-22: qty 3

## 2018-01-22 MED ORDER — KETAMINE HCL 10 MG/ML IJ SOLN
INTRAMUSCULAR | Status: DC | PRN
  Administered 2018-01-22: 20 mg via INTRAVENOUS

## 2018-01-22 MED ORDER — FENTANYL CITRATE (PF) 250 MCG/5ML IJ SOLN
INTRAMUSCULAR | Status: AC
  Filled 2018-01-22: qty 5

## 2018-01-22 MED ORDER — ALBUTEROL SULFATE (2.5 MG/3ML) 0.083% IN NEBU
2.5000 mg | INHALATION_SOLUTION | Freq: Once | RESPIRATORY_TRACT | Status: AC
  Administered 2018-01-22: 2.5 mg via RESPIRATORY_TRACT

## 2018-01-22 MED ORDER — NALOXONE HCL 0.4 MG/ML IJ SOLN
INTRAMUSCULAR | Status: AC
  Administered 2018-01-22: 21:00:00
  Filled 2018-01-22: qty 1

## 2018-01-22 MED ORDER — INSULIN REGULAR HUMAN (CONC) 500 UNIT/ML ~~LOC~~ SOPN: 170.0000 [IU] | PEN_INJECTOR | Freq: Two times a day (BID) | SUBCUTANEOUS | Status: DC

## 2018-01-22 MED ORDER — BUPIVACAINE-EPINEPHRINE (PF) 0.5% -1:200000 IJ SOLN
INTRAMUSCULAR | Status: AC
  Filled 2018-01-22: qty 30

## 2018-01-22 MED ORDER — KETAMINE HCL 50 MG/5ML IJ SOSY
PREFILLED_SYRINGE | INTRAMUSCULAR | Status: AC
  Filled 2018-01-22: qty 5

## 2018-01-22 MED ORDER — INSULIN REGULAR HUMAN (CONC) 500 UNIT/ML ~~LOC~~ SOPN
170.0000 [IU] | PEN_INJECTOR | Freq: Once | SUBCUTANEOUS | Status: AC
Start: 2018-01-22 — End: 2018-01-22
  Administered 2018-01-22: 170 [IU] via SUBCUTANEOUS

## 2018-01-22 MED ORDER — ONDANSETRON HCL 4 MG/2ML IJ SOLN
INTRAMUSCULAR | Status: DC | PRN
  Administered 2018-01-22: 4 mg via INTRAVENOUS

## 2018-01-22 MED ORDER — MIDAZOLAM HCL 2 MG/2ML IJ SOLN
INTRAMUSCULAR | Status: AC
  Filled 2018-01-22: qty 2

## 2018-01-22 MED ORDER — INSULIN ASPART 100 UNIT/ML ~~LOC~~ SOLN: 200.0000 [IU] | Freq: Two times a day (BID) | SUBCUTANEOUS | Status: DC

## 2018-01-22 MED ORDER — LIDOCAINE HCL (CARDIAC) PF 100 MG/5ML IV SOSY
PREFILLED_SYRINGE | INTRAVENOUS | Status: DC | PRN
  Administered 2018-01-22: 50 mg via INTRAVENOUS

## 2018-01-22 MED ORDER — NALOXONE HCL 0.4 MG/ML IJ SOLN
0.0400 mg | Freq: Once | INTRAMUSCULAR | Status: AC
  Administered 2018-01-22: 0.04 mg via INTRAVENOUS

## 2018-01-22 MED ORDER — FENTANYL CITRATE (PF) 100 MCG/2ML IJ SOLN
INTRAMUSCULAR | Status: DC | PRN
  Administered 2018-01-22: 100 ug via INTRAVENOUS

## 2018-01-22 MED ORDER — ALBUTEROL SULFATE (2.5 MG/3ML) 0.083% IN NEBU
INHALATION_SOLUTION | RESPIRATORY_TRACT | Status: AC
  Administered 2018-01-22: 20:00:00
  Filled 2018-01-22: qty 3

## 2018-01-22 MED ORDER — SODIUM CHLORIDE 0.45 % IV SOLN
INTRAVENOUS | Status: DC
  Administered 2018-01-22 – 2018-01-26 (×4): via INTRAVENOUS
  Administered 2018-01-30: 50 mL via INTRAVENOUS

## 2018-01-22 MED ORDER — 0.9 % SODIUM CHLORIDE (POUR BTL) OPTIME
TOPICAL | Status: DC | PRN
  Administered 2018-01-22: 1000 mL

## 2018-01-22 SURGICAL SUPPLY — 31 items
BNDG GAUZE ELAST 4 BULKY (GAUZE/BANDAGES/DRESSINGS) ×2 IMPLANT
CANISTER SUCT 3000ML PPV (MISCELLANEOUS) ×2 IMPLANT
COVER SURGICAL LIGHT HANDLE (MISCELLANEOUS) ×2 IMPLANT
DRAPE UTILITY XL STRL (DRAPES) ×4 IMPLANT
DRSG PAD ABDOMINAL 8X10 ST (GAUZE/BANDAGES/DRESSINGS) ×2 IMPLANT
ELECT CAUTERY BLADE 6.4 (BLADE) ×2 IMPLANT
ELECT REM PT RETURN 9FT ADLT (ELECTROSURGICAL) ×2
ELECTRODE REM PT RTRN 9FT ADLT (ELECTROSURGICAL) ×1 IMPLANT
GAUZE PACKING IODOFORM 1 (PACKING) IMPLANT
GAUZE SPONGE 4X4 12PLY STRL (GAUZE/BANDAGES/DRESSINGS) ×2 IMPLANT
GLOVE SURG SIGNA 7.5 PF LTX (GLOVE) ×2 IMPLANT
GOWN STRL REUS W/ TWL LRG LVL3 (GOWN DISPOSABLE) ×1 IMPLANT
GOWN STRL REUS W/ TWL XL LVL3 (GOWN DISPOSABLE) ×1 IMPLANT
GOWN STRL REUS W/TWL LRG LVL3 (GOWN DISPOSABLE) ×1
GOWN STRL REUS W/TWL XL LVL3 (GOWN DISPOSABLE) ×1
KIT BASIN OR (CUSTOM PROCEDURE TRAY) ×2 IMPLANT
KIT TURNOVER KIT B (KITS) ×2 IMPLANT
NS IRRIG 1000ML POUR BTL (IV SOLUTION) ×2 IMPLANT
PACK LITHOTOMY IV (CUSTOM PROCEDURE TRAY) ×2 IMPLANT
PAD ABD 8X10 STRL (GAUZE/BANDAGES/DRESSINGS) ×2 IMPLANT
PAD ARMBOARD 7.5X6 YLW CONV (MISCELLANEOUS) ×2 IMPLANT
PENCIL BUTTON HOLSTER BLD 10FT (ELECTRODE) ×2 IMPLANT
SPONGE LAP 18X18 X RAY DECT (DISPOSABLE) ×2 IMPLANT
SWAB COLLECTION DEVICE MRSA (MISCELLANEOUS) IMPLANT
SWAB CULTURE ESWAB REG 1ML (MISCELLANEOUS) IMPLANT
SYR BULB 3OZ (MISCELLANEOUS) ×2 IMPLANT
TOWEL OR 17X24 6PK STRL BLUE (TOWEL DISPOSABLE) ×2 IMPLANT
TOWEL OR 17X26 10 PK STRL BLUE (TOWEL DISPOSABLE) ×2 IMPLANT
TUBE CONNECTING 12X1/4 (SUCTIONS) ×2 IMPLANT
UNDERPAD 30X30 (UNDERPADS AND DIAPERS) ×2 IMPLANT
YANKAUER SUCT BULB TIP NO VENT (SUCTIONS) ×2 IMPLANT

## 2018-01-22 NOTE — Op Note (Signed)
IRRIGATION AND DEBRIDEMENT LABIAL/VULVAR AREA  Procedure Note  [name] 01/22/2018   Pre-op Diagnosis: NECROTIZING SOFT TISSUE INFECTION OF LEFT LABIA/GROIN     Post-op Diagnosis: SAME  Procedure(s): IRRIGATION AND DEBRIDEMENT LABIAL/VULVAR AREA (40 SQUARE CM OF SKIN AND NECROTIC FAT DEBRIDED SHARPLY WITH A SCAPLE  Surgeon(s): Coralie Keens, MD  Anesthesia: General  Staff:  Circulator: Rozell Searing, RN Relief Circulator: Lottie Mussel, RN Relief Scrub: Maren Reamer D Scrub Person: Christeen Alandra Sando, CST Circulator Assistant: Hal Morales, RN  Estimated Blood Loss: Minimal               Cultures sent  Procedure: The patient was brought to the operating room and identified as the correct patient.  She was placed upon the operating table and general anesthesia was induced.  The patient was then placed in the lithotomy position.  Her labia and groins were then prepped and draped in usual sterile fashion.  The patient had a necrotic wound in the left groin near the labia which is actively draining purulence.  Cultures were obtained.  I widely excised this area with a scalpel sharply.  There was a large amount of necrotic fat and subcutaneous tissue with purulence in the area.  I sharply debrided 40 cm of skin and subtenons tissue with a scalpel.  I made the opening large for packing.  Hemostasis appeared to be achieved with the cautery.  I saw no other clear necrotic tissue.  I then irrigated the wound with a liter of normal saline.  I then packed it with a large wet-to-dry Kerlix gauze.  Dry gauze was placed over this.  The patient tolerated the procedure well.  All the counts were correct at the end of the procedure.  The patient was then taken in a guarded condition from the operating room to the recovery room.          [name] A   Date: 01/22/2018  Time: 7:06 PM

## 2018-01-22 NOTE — Progress Notes (Addendum)
Palliative:  Full note to follow. Discussed GOC and possible complications of surgery.  - No trach - Would continue vent post-op (if unable to wean) but NO TRACH. Would not want to be re-intubated.  - No feeding tube - Not keen on dialysis (did not give definite no at this time but not a good long term candidate given co-morbidities and overall weakness)  Palliative will follow post-op course and assist with any complications and decisions that occur.   Vinie Sill, NP  Palliative Medicine Team Pager # [phone] (M-F 8a-5p) Team Phone # [phone] (Nights/Weekends)

## 2018-01-22 NOTE — Anesthesia Preprocedure Evaluation (Addendum)
Anesthesia Evaluation  Patient identified by MRN, date of birth, ID band Patient awake    Reviewed: Allergy & Precautions, NPO status , Patient's Chart, lab work & pertinent test results  History of Anesthesia Complications (+) Family history of anesthesia reaction  Airway Mallampati: III  TM Distance: >3 FB Neck ROM: Full    Dental  (+) Edentulous Upper, Edentulous Lower   Pulmonary shortness of breath, with exertion, at rest and lying, asthma , sleep apnea and Continuous Positive Airway Pressure Ventilation , COPD,  COPD inhaler, Current Smoker,    Pulmonary exam normal breath sounds clear to auscultation       Cardiovascular hypertension, Pt. on medications + CAD, + Peripheral Vascular Disease, +CHF and + Orthopnea  Normal cardiovascular exam+ dysrhythmias  Rhythm:Regular Rate:Normal  Cath 03/2016 Mild disease LAD, mild-moderate disease RCA LVEF 60% RBBB   Neuro/Psych Seizures -, Well Controlled,  PSYCHIATRIC DISORDERS Anxiety Bipolar Disorder Left sided weakness Restless legs syndrome TIA Neuromuscular disease CVA, Residual Symptoms    GI/Hepatic GERD  Medicated and Controlled,  Endo/Other  diabetes, Poorly Controlled, Type 2, Insulin Dependent, Oral Hypoglycemic AgentsMorbid obesityHyperlipidemia  Renal/GU Renal InsufficiencyRenal disease     Musculoskeletal   Abdominal (+) + obese,   Peds  Hematology  (+) anemia ,   Anesthesia Other Findings   Reproductive/Obstetrics Vulvar/perineal abscess- Fournier's gangrene                            Anesthesia Physical Anesthesia Plan  ASA: III  Anesthesia Plan: General   Post-op Pain Management:    Induction: Intravenous  PONV Risk Score and Plan: 4 or greater and Midazolam, Ondansetron and Treatment may vary due to age or medical condition  Airway Management Planned: Oral ETT  Additional Equipment:   Intra-op Plan:    Post-operative Plan: Extubation in OR  Informed Consent: I have reviewed the patients History and Physical, chart, labs and discussed the procedure including the risks, benefits and alternatives for the proposed anesthesia with the patient or authorized representative who has indicated his/her understanding and acceptance.   Dental advisory given  Plan Discussed with: CRNA and Surgeon  Anesthesia Plan Comments: (Discussed DNR with patient, will suspend during procedure. )      Anesthesia Quick Evaluation

## 2018-01-22 NOTE — Progress Notes (Signed)
Pt.  Refuse CPAP, 93% on 4L O2,will continue to monitor.

## 2018-01-22 NOTE — Progress Notes (Signed)
Care Connection:  Pt is active with our Care connection program a home based palliative care program provided by [facility]. She and her husband have had difficult conversation today with the onset of pt going to surgery shortly. She has expressed her wishes to him and he has stated he will honor them. She chooses to be a DNR and also does not want to be on a ventilator for a long period of time. This was defined by her as "if they can not get me off the machine after surgery or the MD states that there are no other options because he feels she is brain dead".  They have called her brother and the children as well to update them and that the pt will go to surgery later today.. She did not get a lunch tray and knows that she is NPO. Please notify us if there are any questions or concerns. We will continue to follow and support pt and family during this hospitalization. Webb Silversmith RN [phone]

## 2018-01-22 NOTE — Progress Notes (Addendum)
[facility]                                                                              Patient Demographics  [name], is a 47 y.o. female, DOB - July 29, 1970, YYT:[id number]  Admit date - 01/19/2018   Admitting Physician Debbe Odea, MD  Outpatient Primary MD for the patient is Pleas Koch, NP  Outpatient specialists:   LOS - 3  days    Chief Complaint  Patient presents with  . Cellulitis       Brief summary   [name] is a 47 y.o. female with medical history significant for but not limited to poorly controlled resistant t2dm, morbid obesity, copd with possible superimposed obesity hypoventilation syndrome, on chronic O 2 (2L during the day, 4 at night), osa on nightly cpcp, htn, dCHF, and CAD  Presenting with 4 day history of vulvar pain,  And limited for uncontrolled diabetes with mons pubis cellulitis and secondary sepsis.  She was given 2 L of normal saline and antibiotics initiated at the ED prior to her admission     Assessment & Plan    Principal Problem:   Vulvar cellulitis r/o abscess Active Problems:   Morbid (severe) obesity due to excess calories (HCC)   Tobacco abuse   COPD GOLD 0    Seizure disorder (HCC)   Bipolar disorder (HCC)   Sleep apnea   Essential hypertension   Diabetes (HCC)   IBS (irritable bowel syndrome)   Sepsis (HCC)   Elevated serum hCG    1. Left mons pubis cellulitis w sepsis - not really much better after 3 days IV abx, now is draining pus from lesion L mons pubis area, may be developing  abscess  - CT abdomen and pelvis 01/19/2018 > mons pubis swelling/edema with left lower abdominal panniculitis- no abscess seen  - penicillin allergic; getting IV aztreonam / metronidazole/vancomycin  - will consult surgeon, swab culture ordered of drainage  - patient w/ many chronic comorbidities and prob high risk for complications   2. Uncontrolled type 2 diabetes mellitus:   - optimize glycemic control with  monitoring of sugar levels   -  Holding home metformin   - doesn't appear to be on any long-acting insulin at home   - BS's down a bit in low 200's.  Patient says she doesn't "feel good" when her BS is less than 200 and doesn't treat w/ SSI unless BS > 200  - right now continue bid SSI , treating BS"s > 200 w/ high dose insulin w/ pharm assistance   - will consult diabetes team as well for any better options   3. Acute on chronic kidney injury: better - Creatinine up from baseline 1.5- 1.7 >> to 2.1 on admission, now down 1.7  - diuretics on hold -metolazone, torsemide and Aldactone  - dc nsaids  - lower IVF"s to 50 cc/hr  4. elevated HCG:  has not had sex for 4-5 years now  follow clinically -  Consider outpatient workup if persistent   5. COPD / OSA:    - sees pulm in office for COPD goad stage  0 w nocturnal hypoxemia (on noct O2 at 3L as of sept 2018 office visit)  - Supportive care, p.r.n. Albuterol, on symbicort at home   - no wheezing here  - no resp distress, no nasal O2 here as at home (4L [place] at home)   6.  Chronic diastolic CHF:   - admitted here by cardiology once in 2017 and once late 2018 (on vent/CCM/ diuresed for diast CHF exacerbation w TME and AKI) Diuretics on hold in view of sepsis - get daily wts, - need better I/O - decrease IVF"s somewhat , is drinking ok  7.  DNR: reconfirmed w/ patient  Kelly Splinter MD [facility] pgr [phone] 12/03/2017, 9:25 AM  Between 7am to 7pm - call Pager - [phone]  After 7pm go to [email or web address] - password [email or web address]  Call night coverage person covering after 7pm    Code Status:  DNR DVT Prophylaxis: heparin  Family Communication: none at bedside Disposition Plan:  home  Procedures:   Medications  Subjective:   [name] still having sig pain w/ difficulty walking.  Getting up to BR though, voiding ok (not being recorded). Eating and drinking ok.  WBC still up 20k.  Started draining pus from L  side.     Scheduled Meds: . busPIRone  30 mg Oral BID  . famotidine  20 mg Oral Daily  . FLUoxetine  40 mg Oral Daily  . heparin  5,000 Units Subcutaneous Q8H  .  HYDROmorphone (DILAUDID) injection  1 mg Intravenous Once  . insulin aspart  200-500 Units Subcutaneous BID WC  . ketorolac  30 mg Intravenous Q6H  . linaclotide  290 mcg Oral QAC breakfast  . mometasone-formoterol  2 puff Inhalation BID  . pregabalin  300 mg Oral BID  . traZODone  150 mg Oral QHS   Continuous Infusions: . 0.9 % NaCl with KCl 20 mEq / L 150 mL/hr at 01/22/18 0942  . aztreonam 1 g (01/22/18 1008)  . metronidazole 500 mg (01/22/18 0200)  . vancomycin 1,500 mg (01/21/18 1728)   PRN Meds:.albuterol, ALPRAZolam   Antibiotics   Anti-infectives (From admission, onward)   Start     Dose/Rate Route Frequency Ordered Stop   01/19/18 1800  vancomycin (VANCOCIN) 1,500 mg in sodium chloride 0.9 % 500 mL IVPB     1,500 mg 250 mL/hr over 120 Minutes Intravenous Every 24 hours 01/19/18 1639     01/19/18 1700  aztreonam (AZACTAM) 1 g in sodium chloride 0.9 % 100 mL IVPB     1 g 200 mL/hr over 30 Minutes Intravenous Every 8 hours 01/19/18 1639     01/19/18 1700  metroNIDAZOLE (FLAGYL) IVPB 500 mg     500 mg 100 mL/hr over 60 Minutes Intravenous Every 8 hours 01/19/18 1639            Objective:   Vitals:   01/21/18 1932 01/21/18 2003 01/22/18 0441 01/22/18 0907  BP:  128/62 120/60   Pulse: 82 94 90   Resp: 18  18   Temp:  98.6 F (37 C) 99.1 F (37.3 C)   TempSrc:  Oral Oral   SpO2: 92% 91% 97% 95%  Weight:      Height:        Intake/Output Summary (Last 24 hours) at 01/22/2018 1051 Last data filed at 01/22/2018 0900 Gross per 24 hour  Intake 2869.93 ml  Output 1 ml  Net 2868.93 ml     Wt Readings from Last 3  Encounters:  01/19/18 131.5 kg (290 lb)  01/08/18 131.5 kg (290 lb)  12/21/17 127.9 kg (282 lb)     Exam  General: NAD  HEENT: NCAT,  PERRL,MMM  Neck: SUPPLE, (-)  JVD  Cardiovascular: RRR, (-) GALLOP, (-) MURMUR  Respiratory: CTA  Gastrointestinal: SOFT, (-) DISTENSION, BS(+), (_) TENDERNESS  Ext: (-) CYANOSIS, (-) EDEMA  Neuro: A, OX 3  Skin: erythema/induration area of new fluctuance and pus draining from L mons pubis area   Data Reviewed:  I have personally reviewed following labs and imaging studies  Micro Results Recent Results (from the past 240 hour(s))  Blood Culture (routine x 2)     Status: None (Preliminary result)   Collection Time: 01/19/18  4:26 PM  Result Value Ref Range Status   Specimen Description BLOOD SITE NOT SPECIFIED  Final   Special Requests   Final    BOTTLES DRAWN AEROBIC AND ANAEROBIC Blood Culture adequate volume   Culture   Final    NO GROWTH 3 DAYS Performed at [facility], [address] [place], [place] [address]    Report Status PENDING  Incomplete  Blood Culture (routine x 2)     Status: None (Preliminary result)   Collection Time: 01/19/18  4:31 PM  Result Value Ref Range Status   Specimen Description BLOOD BLOOD RIGHT FOREARM  Final   Special Requests   Final    BOTTLES DRAWN AEROBIC AND ANAEROBIC Blood Culture adequate volume   Culture   Final    NO GROWTH 3 DAYS Performed at [facility], [place] [address], [place],  [address]    Report Status PENDING  Incomplete    Radiology Reports Dg Chest 2 View  Result Date: 01/19/2018 CLINICAL DATA:  Pelvic infection.  No chest complaints. EXAM: CHEST - 2 VIEW COMPARISON:  Chest x-ray dated October 19, 2017. FINDINGS: The heart remains at the upper limits of normal in size. Normal pulmonary vascularity. Mild chronic bronchitic changes are again noted. Unchanged eventration of the right hemidiaphragm. Linear atelectasis in the right middle lobe. No focal consolidation, pleural effusion, or pneumothorax. No acute osseous abnormality. IMPRESSION: Chronic bronchitic changes.  No active cardiopulmonary disease. Electronically Signed   By:  Titus Dubin M.D.   On: 01/19/2018 15:59   Dg Lumbar Spine Complete W/bend  Result Date: 01/15/2018 CLINICAL DATA:  Chronic lumbago with bilateral radicular symptoms EXAM: LUMBAR SPINE - COMPLETE WITH BENDING VIEWS COMPARISON:  None. FINDINGS: Frontal, neutral lateral, flexion lateral, extension lateral, spot lumbosacral lateral, and bilateral oblique-total 7-views obtained. There are 5 non-rib-bearing lumbar type vertebral bodies. Bony mineralization is within normal limits. There is no fracture. There is no spondylolisthesis on neutral lateral imaging. There is no change in lateral alignment with flexion-extension. Patient shows essentially normal range of motion with flexion and extension. The disc spaces in the lumbar region appear normal. There are anterior osteophytes at T10, T11, T12, and L1. There is facet osteoarthritic change at L3-4, L4-5, and L5-S1 on the left. Other facets appear unremarkable. There is aortic atherosclerosis. IMPRESSION: Areas of facet osteoarthritic change on the left. No fracture or spondylolisthesis. No appreciable change in lateral alignment with flexion-extension. No appreciable disc space narrowing in the lumbar regions. There is aortic atherosclerosis. Aortic Atherosclerosis (ICD10-I70.0). Electronically Signed   By: Lowella Grip III M.D.   On: 01/15/2018 14:03   Ct Pelvis Wo Contrast  Result Date: 01/19/2018 CLINICAL DATA:  Rash of the left side of the groin. Lump  of the vagina. EXAM: CT PELVIS WITHOUT CONTRAST TECHNIQUE: Multidetector CT imaging of the pelvis was performed following the standard protocol without intravenous contrast. COMPARISON:  03/05/2010 FINDINGS: Urinary Tract: No bladder abnormality seen. Distal ureters appear negative. Inferior renal poles appear negative. Bowel: Sigmoid diverticulosis without evidence of diverticulitis. No other bowel finding. Vascular/Lymphatic: Atherosclerosis of the aorta and the iliac arteries. No aneurysm.  Reproductive: No uterine or adnexal mass or inflammatory change. No abnormality of the vagina identified. Other: Edema and swelling of the mons pubis on the left and the lower left abdominal panniculus consistent with cellulitis. No evidence of drainable deep abscess. Musculoskeletal: Ordinary lower lumbar degenerative changes. Other bones of the region appear normal. IMPRESSION: Swelling and edema of the mons pubis and the lower left abdominal panniculus on the left consistent with cellulitis. No evidence of drainable abscess. Electronically Signed   By: Nelson Chimes M.D.   On: 01/19/2018 17:32    Lab Data:  CBC: Recent Labs  Lab 01/19/18 1526 01/20/18 0336 01/21/18 0317 01/22/18 0341  WBC 24.6* 20.9* 20.8* 19.9*  NEUTROABS 21.4*  --   --   --   HGB 11.7* 11.1* 10.4* 10.2*  HCT 36.3 34.2* 33.8* 33.1*  MCV 83.8 83.6 87.3 86.4  PLT 186 168 167 767   Basic Metabolic Panel: Recent Labs  Lab 01/19/18 1526 01/20/18 0336 01/21/18 0317 01/22/18 0341  NA 124* 127* 130* 131*  K 3.7 3.6 4.1 4.0  CL 79* 85* 95* 97*  CO2 28 28 23 22   GLUCOSE 442* 516* 488* 381*  BUN 60* 56* 55* 56*  CREATININE 2.18* 1.77* 1.72* 1.71*  CALCIUM 8.7* 8.6* 8.3* 8.2*   GFR: Estimated Creatinine Clearance: 53.7 mL/min (A) (by C-G formula based on SCr of 1.71 mg/dL (H)). Liver Function Tests: Recent Labs  Lab 01/19/18 1526 01/21/18 0317 01/22/18 0341  AST 28 22 25   ALT 25 27 28   ALKPHOS 118 138* 167*  BILITOT 1.3* 1.2 1.1  PROT 8.0 7.5 7.2  ALBUMIN 3.2* 2.7* 2.4*   No results for input(s): LIPASE, AMYLASE in the last 168 hours. No results for input(s): AMMONIA in the last 168 hours. Coagulation Profile: No results for input(s): INR, PROTIME in the last 168 hours. Cardiac Enzymes: No results for input(s): CKTOTAL, CKMB, CKMBINDEX, TROPONINI in the last 168 hours. BNP (last 3 results) No results for input(s): PROBNP in the last 8760 hours. HbA1C: Recent Labs    01/20/18 0336  HGBA1C 10.4*    CBG: Recent Labs  Lab 01/21/18 1627 01/21/18 2006 01/22/18 0220 01/22/18 0620 01/22/18 0808  GLUCAP 553* 592* 400* 272* 270*   Lipid Profile: No results for input(s): CHOL, HDL, LDLCALC, TRIG, CHOLHDL, LDLDIRECT in the last 72 hours. Thyroid Function Tests: No results for input(s): TSH, T4TOTAL, FREET4, T3FREE, THYROIDAB in the last 72 hours. Anemia Panel: No results for input(s): VITAMINB12, FOLATE, FERRITIN, TIBC, IRON, RETICCTPCT in the last 72 hours. Urine analysis:    Component Value Date/Time   COLORURINE AMBER (A) 01/19/2018 2312   APPEARANCEUR TURBID (A) 01/19/2018 2312   LABSPEC 1.013 01/19/2018 2312   PHURINE 5.0 01/19/2018 2312   GLUCOSEU 150 (A) 01/19/2018 2312   HGBUR LARGE (A) 01/19/2018 2312   HGBUR negative 03/07/2008 1643   BILIRUBINUR NEGATIVE 01/19/2018 2312   BILIRUBINUR NEG 11/03/2015 1035   KETONESUR NEGATIVE 01/19/2018 2312   PROTEINUR 100 (A) 01/19/2018 2312   UROBILINOGEN 0.2 11/03/2015 1035   UROBILINOGEN 0.2 10/08/2011 1850   NITRITE NEGATIVE 01/19/2018 2312  LEUKOCYTESUR MODERATE (A) 01/19/2018 2312

## 2018-01-22 NOTE — Progress Notes (Signed)
Patient still heavily sedated in PACU. Sat on 10L simple mask 87%. Per Dr Smith Robert will get ABG and place patient on Bipap while in PACU

## 2018-01-22 NOTE — Consult Note (Signed)
Consultation Note Date: 01/22/2018   Patient Name: [name]  DOB: [birth year]  MRN: [id number]  Age / Sex: 47 y.o., female  PCP: Pleas Koch, NP Referring Physician: Roney Jaffe, MD  Reason for Consultation: Establishing goals of care  HPI/Patient Profile: 47 y.o. female  with past medical history of poorly controlled resistant DM2, morbid obesity, COPD with possible superimposed obesity hypoventilation syndrome, chronic oxygen (2L during the day, 4L at night), OSA on nightly CPAP, HTN, dCHF, CAD, CVA, bipolar, tobacco abuse admitted on 01/19/2018 with vulvar pain with infected pus draining lesion and now for surgical debridement as wound is not improving with antibiotics.   Clinical Assessment and Goals of Care: I met today with [name]'s husband, Kennith Center, initially while Myrikal slept. Kennith Center has many questions that are appropriate but also trying to plan and mentally prepare himself for any scenario. He is very much aware of how sick his wife is with multiple co-morbidities. He is clear that her wishes are for DNR and life not to be prolonged on machines. He also shares that most of her family have died from the same health issues at young ages and she only has 1 brother left. They have no children together but [place] adopted [name]'s 2 children he already had.   [name] awakened and we were able to discuss GOC further. She would never want a trach. Would be amendable to ventilator short-term (up to 10-14 days) if making progress but would not want to intubated in post-op period. DNR to be activated immediately post-op (husband was worried about this). They understand the complications and risks of surgery. She would not want a feeding tube and is not very interested in dialysis. Somewhat difficult to keep them to the discussion at hand at times. Will need to help guide them through post-op period  especially if complications occur as they will need support and assistance through any decisions to be made.   Primary Decision Maker PATIENT    SUMMARY OF RECOMMENDATIONS   - DNR - Willing for surgery - High risk for complications and poor healing - Will continue to follow  Code Status/Advance Care Planning:  DNR   Symptom Management:   Per attending  Palliative Prophylaxis:   Bowel Regimen, Delirium Protocol and Frequent Pain Assessment  Psycho-social/Spiritual:   Desire for further Chaplaincy support:yes  Additional Recommendations: Caregiving  Support/Resources  Prognosis:   Unable to determine  Discharge Planning: To Be Determined      Primary Diagnoses: Present on Admission: . Abdominal wall cellulitis . Morbid (severe) obesity due to excess calories ([place]) . Tobacco abuse . COPD GOLD 0  . Bipolar disorder ([place]) . Sleep apnea . Essential hypertension   I have reviewed the medical record, interviewed the patient and family, and examined the patient. The following aspects are pertinent.  Past Medical History:  Diagnosis Date  . Anxiety    takes Prozac daily  . Anxiety   . Aortic valve calcification   . Asthma    Advair and Spirva daily  .  Asthma   . Bipolar disorder ([place])   . CAD (coronary artery disease)    a. LHC 11/2013 done for CP/fluid retention: mild disease in prox LAD, mild-mod disease in mRCA, EF 60% with normal LVEDP. b. Normal nuc 03/2016.  Marland Kitchen CHF (congestive heart failure) ([place])   . Chronic diastolic CHF (congestive heart failure) ([place])   . CKD (chronic kidney disease), stage II   . COPD (chronic obstructive pulmonary disease) ([facility])    a. nocturnal O2.  Marland Kitchen COPD (chronic obstructive pulmonary disease) ([place])   . Coronary artery disease   . Diabetes mellitus   . Diabetes mellitus without complication ([place])   . Dyspnea   . Family history of adverse reaction to anesthesia    mom gets nauseated  . GERD (gastroesophageal reflux disease)     takes Pepcid daily  . History of blood clots    left leg 3-30yr ago  . Hyperlipidemia   . Hypertension   . Hypertriglyceridemia   . Inguinal hernia, left 01/2015  . Muscle spasm   . Peripheral neuropathy   . RBBB   . [place]ures ([place]   . Sinus tachycardia    a. persistent since 2009.  .[place]' cough ([place]   . Stroke ([facility] 1989   left sided weakness  . TIA (transient ischemic attack)   . Tobacco abuse    Social History   Socioeconomic History  . Marital status: Married    Spouse name: Not on file  . Number of children: Not on file  . Years of education: Not on file  . Highest education level: Not on file  Occupational History  . Not on file  Social Needs  . Financial resource strain: Not on file  . Food insecurity:    Worry: Not on file    Inability: Not on file  . Transportation needs:    Medical: Not on file    Non-medical: Not on file  Tobacco Use  . Smoking status: Current Every Day Smoker    Packs/day: 1.00    Years: 36.00    Pack years: 36.00    Types: Cigarettes    Last attempt to quit: 11/08/2016    Years since quitting: 1.2  . Smokeless tobacco: Never Used  Substance and Sexual Activity  . Alcohol use: No    Frequency: Never  . Drug use: No  . Sexual activity: Never  Lifestyle  . Physical activity:    Days per week: Not on file    Minutes per session: Not on file  . Stress: Not on file  Relationships  . Social connections:    Talks on phone: Not on file    Gets together: Not on file    Attends religious service: Not on file    Active member of club or organization: Not on file    Attends meetings of clubs or organizations: Not on file    Relationship status: Not on file  Other Topics Concern  . Not on file  Social History Narrative   ** Merged History Encounter **       [place]  Married but lives with Boyfriend - not legally separated   Disabled - for BiPolar, Seizure disorder, diabetes   Formerly worked at [employer]         Family History  Problem Relation Age of Onset  . Venous thrombosis Brother   . Other Brother        BRAIN TUMOR  . Asthma Father   .  Diabetes Father   . Coronary artery disease Mother   . Hypertension Mother   . Diabetes Mother   . Asthma Sister   . Diabetes type II Brother    Scheduled Meds: . busPIRone  30 mg Oral BID  . famotidine  20 mg Oral Daily  . FLUoxetine  40 mg Oral Daily  . heparin  5,000 Units Subcutaneous Q8H  .  HYDROmorphone (DILAUDID) injection  1 mg Intravenous Once  . insulin regular human CONCENTRATED  170-370 Units Subcutaneous BID WC  . linaclotide  290 mcg Oral QAC breakfast  . mometasone-formoterol  2 puff Inhalation BID  . pregabalin  300 mg Oral BID  . traZODone  150 mg Oral QHS   Continuous Infusions: . sodium chloride 50 mL/hr at 01/22/18 1313  . aztreonam 1 g (01/22/18 1008)  . metronidazole 500 mg (01/22/18 1059)  . vancomycin 1,500 mg (01/21/18 1728)   PRN Meds:.albuterol, ALPRAZolam Allergies  Allergen Reactions  . Adhesive [Tape] Rash and Other (See Comments)    TAKES OFF THE SKIN (CERTAIN MEDICAL TAPES DO THIS!!)  . Metoprolol Shortness Of Breath    Occurrence of shortness of breath after 3 days  . Montelukast Shortness Of Breath  . Morphine Sulfate Anaphylaxis, Shortness Of Breath and Nausea And Vomiting    Swollen Throat - Able to tolerate dilaudid  . Penicillins Anaphylaxis, Hives and Shortness Of Breath    Throat swells Has patient had a PCN reaction causing immediate rash, facial/tongue/throat swelling, SOB or lightheadedness with hypotension: Yes Has patient had a PCN reaction causing severe rash involving mucus membranes or skin necrosis: No Has patient had a PCN reaction that required hospitalization: Yes Has patient had a PCN reaction occurring within the last 10 years: No If all of the above answers are "NO", then may proceed with Cephalosporin use.   . Prednisone Anaphylaxis  . Diltiazem Swelling  . Gabapentin  Swelling   Review of Systems  Constitutional: Positive for activity change and fatigue.       Vulvar pain  Respiratory: Positive for shortness of breath.   Neurological: Positive for weakness.    Physical Exam  Constitutional: She is oriented to person, place, and time. She appears well-developed.  HENT:  Head: Normocephalic and atraumatic.  Cardiovascular: Normal rate.  Pulmonary/Chest: Effort normal. No accessory muscle usage. No tachypnea.  Mild distress especially when lying flat  Abdominal: Normal appearance.  Neurological: She is alert and oriented to person, place, and time.  Nursing note and vitals reviewed.   Vital Signs: BP 120/60 (BP Location: Left Arm)   Pulse 90   Temp 99.1 F (37.3 C) (Oral)   Resp 18   Ht _0  (1.626 m)   Wt (!) 139.7 kg (307 lb 15.7 oz)   LMP 11/11/2011   SpO2 95%   BMI 52.87 kg/m  Pain Scale: 0-10 POSS *See Group Information*: 1-Acceptable,Awake and alert Pain Score: Asleep   SpO2: SpO2: 95 % O2 Device:SpO2: 95 % O2 Flow Rate: .O2 Flow Rate (L/min): 3 L/min  IO: Intake/output summary:   Intake/Output Summary (Last 24 hours) at 01/22/2018 1513 Last data filed at 01/22/2018 0900 Gross per 24 hour  Intake 2869.93 ml  Output 1 ml  Net 2868.93 ml    LBM: Last BM Date: 01/20/18 Baseline Weight: Weight: 131.5 kg (290 lb) Most recent weight: Weight: (!) 139.7 kg (307 lb 15.7 oz)     Palliative Assessment/Data: 30%    Time Total: 70 min  Greater than 50%  of this time was spent counseling and coordinating care related to the above assessment and plan.  Signed by: Vinie Sill, NP Palliative Medicine Team Pager # [phone] (M-F 8a-5p) Team Phone # [phone] (Nights/Weekends)

## 2018-01-22 NOTE — Progress Notes (Addendum)
Called to PACU for transport of pt on BiPAP to 5W04. Transport uneventful. Pt settings 20/6 with 40% FiO2, rate of 16. Pt resting comfortably. RT will continue to monitor. Gave report to 5W RT Tracey.

## 2018-01-22 NOTE — Transfer of Care (Signed)
Immediate Anesthesia Transfer of Care Note  Patient: [name]  Procedure(s) Performed: IRRIGATION AND DEBRIDEMENT LABIAL/VULVAR AREA (N/A Perineum)  Patient Location: PACU  Anesthesia Type:General  Level of Consciousness: sedated and drowsy  Airway & Oxygen Therapy: Patient connected to face mask oxygen  Post-op Assessment: Report given to RN and Post -op Vital signs reviewed and stable  Post vital signs: Reviewed and stable  Last Vitals:  Vitals Value Taken Time  BP 125/76 01/22/2018  7:27 PM  Temp 36.2 C 01/22/2018  7:27 PM  Pulse 106 01/22/2018  7:36 PM  Resp 20 01/22/2018  7:36 PM  SpO2 86 % 01/22/2018  7:36 PM  Vitals shown include unvalidated device data.  Last Pain:  Vitals:   01/22/18 1927  TempSrc:   PainSc: Asleep      Patients Stated Pain Goal: 2 (93/26/71 2458)  Complications: No apparent anesthesia complications

## 2018-01-22 NOTE — Consult Note (Signed)
[name] [birth year]  [id number].    Requesting MD: Dr. Roney Jaffe Chief Complaint/Reason for Consult: perineal abscess  HPI:  This is a 47 yo morbidly obese white female with a BMI of 19 with an extensive PMH including, but not limited to CAD, CHF, RBBB and LAFB, resistant and uncontrolled DM, COPD, hypoventilation syndrome on 4LO2 at home, seizures, CVA, CKD, etc who is a DNR.  She began having pain in her perineum on Thursday.  She denies any fevers at home, but persistent pain.  She denies any issues with dysuria, problems with her bowels, loss of appetite, abdominal pain etc.  She noticed that her left vulva became increasingly hard and tender.  She takes Lyrica already at home and none of her other medications helped her pain.  Due to persistent pain, she presented to the ED  On Friday where she was found to have a WBC of 24K and cellulitis on her Mons and vulva.  She was admitted after a CT of the pelvis was done which revealed cellulitis of this area, but no drainable abscess.  Her WBC is still 19K today after initiation of abx therapy and her pain is continuing to worsen.  We have been consulted to see her for further recommendations.     ROS: ROS: Please see HPI, otherwise all other current systems are negative.  She does state at home she is independent with all ADLs, etc.  Family History  Problem Relation Age of Onset  . Venous thrombosis Brother   . Other Brother        BRAIN TUMOR  . Asthma Father   . Diabetes Father   . Coronary artery disease Mother   . Hypertension Mother   . Diabetes Mother   . Asthma Sister   . Diabetes type II Brother     Past Medical History:  Diagnosis Date  . Anxiety    takes Prozac daily  . Anxiety   . Aortic valve calcification   . Asthma    Advair and Spirva daily  . Asthma   . Bipolar disorder ([employer])   . CAD (coronary artery disease)    a. LHC 11/2013 done for CP/fluid retention: mild disease in prox LAD, mild-mod disease  in mRCA, EF 60% with normal LVEDP. b. Normal nuc 03/2016.  Marland Kitchen CHF (congestive heart failure) ([place])   . Chronic diastolic CHF (congestive heart failure) ([place])   . CKD (chronic kidney disease), stage II   . COPD (chronic obstructive pulmonary disease) ([facility])    a. nocturnal O2.  Marland Kitchen COPD (chronic obstructive pulmonary disease) ([place])   . Coronary artery disease   . Diabetes mellitus   . Diabetes mellitus without complication ([place])   . Dyspnea   . Family history of adverse reaction to anesthesia    mom gets nauseated  . GERD (gastroesophageal reflux disease)    takes Pepcid daily  . History of blood clots    left leg 3-27yr ago  . Hyperlipidemia   . Hypertension   . Hypertriglyceridemia   . Inguinal hernia, left 01/2015  . Muscle spasm   . Peripheral neuropathy   . RBBB   . Seizures ([facility]   . Sinus tachycardia    a. persistent since 2009.  .Marland KitchenSmokers' cough ([facility]   . Stroke ([name]   left sided weakness  . TIA (transient ischemic attack)   . Tobacco abuse     Past Surgical History:  Procedure Laterality Date  .  HERNIA REPAIR    . INGUINAL HERNIA REPAIR Left 04/08/2015   Procedure: OPEN LEFT INGUINAL HERNIA REPAIR WITH MESH;  Surgeon: Ralene Ok, MD;  Location: [facility];  Service: General;  Laterality: Left;  . INSERTION OF MESH Left 04/08/2015   Procedure: INSERTION OF MESH;  Surgeon: Ralene Ok, MD;  Location: [facility];  Service: General;  Laterality: Left;  . LAPAROSCOPY     Endometriosis  . LEFT HEART CATHETERIZATION WITH CORONARY ANGIOGRAM N/A 12/05/2013   Procedure: LEFT HEART CATHETERIZATION WITH CORONARY ANGIOGRAM;  Surgeon: Jettie Booze, MD;  Location: [facility] CATH LAB;  Service: Cardiovascular;  Laterality: N/A;  . right kidney drained    . TEE WITHOUT CARDIOVERSION N/A 11/28/2013   Procedure: TRANSESOPHAGEAL ECHOCARDIOGRAM (TEE);  Surgeon: Thayer Headings, MD;  Location: [facility];  Service: Cardiovascular;  Laterality: N/A;    Social History:  reports that  she has been smoking cigarettes.  She has a 36.00 pack-year smoking history. She has never used smokeless tobacco. She reports that she does not drink alcohol or use drugs.  Allergies:  Allergies  Allergen Reactions  . Adhesive [Tape] Rash and Other (See Comments)    TAKES OFF THE SKIN (CERTAIN MEDICAL TAPES DO THIS!!)  . Metoprolol Shortness Of Breath    Occurrence of shortness of breath after 3 days  . Montelukast Shortness Of Breath  . Morphine Sulfate Anaphylaxis, Shortness Of Breath and Nausea And Vomiting    Swollen Throat - Able to tolerate dilaudid  . Penicillins Anaphylaxis, Hives and Shortness Of Breath    Throat swells Has patient had a PCN reaction causing immediate rash, facial/tongue/throat swelling, SOB or lightheadedness with hypotension: Yes Has patient had a PCN reaction causing severe rash involving mucus membranes or skin necrosis: No Has patient had a PCN reaction that required hospitalization: Yes Has patient had a PCN reaction occurring within the last 10 years: No If all of the above answers are "NO", then may proceed with Cephalosporin use.   . Prednisone Anaphylaxis  . Diltiazem Swelling  . Gabapentin Swelling    Medications Prior to Admission  Medication Sig Dispense Refill  . acetaminophen (TYLENOL) 325 MG tablet Take 2 tablets (650 mg total) by mouth every 4 (four) hours as needed for headache or mild pain. 30 tablet 2  . albuterol (PROVENTIL HFA;VENTOLIN HFA) 108 (90 Base) MCG/ACT inhaler INHALE 2 PUFFS INTO LUNGS EVERY 4 HOURS AS NEEDED FOR SHORTNESS OF BREATH 6.7 g 0  . ALPRAZolam (XANAX) 1 MG tablet Take 1 mg by mouth 3 (three) times daily as needed for anxiety.     Marland Kitchen aspirin EC 81 MG tablet Take 81 mg by mouth every morning.    . budesonide-formoterol (SYMBICORT) 80-4.5 MCG/ACT inhaler INHALE 2 PUFFS BY MOUTH EVERY 12 HOURS TO PREVENT COUGH OR WHEEZING *RINSE MOUTH AFTER USSING* 10.2 g 0  . busPIRone (BUSPAR) 30 MG tablet Take 1 tablet (30 mg total)  by mouth 2 (two) times daily. 60 tablet 0  . FLUoxetine (PROZAC) 40 MG capsule Take 40 mg by mouth daily.    . insulin regular human CONCENTRATED (HUMULIN R U-500 KWIKPEN) 500 UNIT/ML kwikpen Inject 400 Units into the skin 2 (two) times daily with a meal. (Patient taking differently: Inject 200-500 Units into the skin 2 (two) times daily before a meal. ) 16 pen 11  . ipratropium-albuterol (DUONEB) 0.5-2.5 (3) MG/3ML SOLN Take 3 mLs by nebulization every 4 (four) hours as needed (wheezing/shortness of breath). 360 mL 0  . linaclotide (  LINZESS) 290 MCG CAPS capsule Take 1 capsule (290 mcg total) by mouth daily before breakfast. 90 capsule 0  . LYRICA 300 MG capsule TAKE 1 CAPSULE BY MOUTH TWICE DAILY FOR NEUROPATHY 180 capsule 0  . metFORMIN (GLUCOPHAGE) 1000 MG tablet Take 1,000 mg by mouth 2 (two) times daily with a meal.    . metolazone (ZAROXOLYN) 2.5 MG tablet Take 2.5 mg by mouth every other day. Take 2.5 mg by mouth every 48 hours on Tuesday, Thursday and Saturday    . ondansetron (ZOFRAN) 4 MG tablet Take 4 mg by mouth every 6 (six) hours as needed for nausea or vomiting.    . OXYGEN Inhale 4 L into the lungs at bedtime.     Marland Kitchen PRESCRIPTION MEDICATION Place 1 drop into both eyes daily as needed (every 14 weeks before injections).    . ranitidine (ZANTAC) 150 MG tablet Take 150 mg by mouth 2 (two) times daily.     Marland Kitchen rOPINIRole (REQUIP) 0.5 MG tablet Take 1 tablet (0.5 mg total) by mouth at bedtime. 90 tablet 0  . Semaglutide (OZEMPIC) 0.25 or 0.5 MG/DOSE SOPN Inject 0.5 mg into the skin once a week. 4 pen 11  . spironolactone (ALDACTONE) 50 MG tablet Take 50 mg by mouth daily.    Marland Kitchen torsemide (DEMADEX) 10 MG tablet Take 3 tablets by mouth once daily for swelling and fluid retention. (Patient taking differently: Take 20-40 mg by mouth 2 (two) times daily. 40 mg in the morning and 20 mg in the evening) 90 tablet 0  . traZODone (DESYREL) 50 MG tablet Take by mouth at bedtime.     Marland Kitchen glucose blood  (ONE TOUCH ULTRA TEST) test strip Used to check blood sugars 4x daily. 200 each 4  . Insulin Pen Needle 32G X 4 MM MISC Used to give insulin injections twice daily. 100 each 11  . nicotine (NICODERM CQ - DOSED IN MG/24 HOURS) 21 mg/24hr patch Place 1 patch (21 mg total) at bedtime onto the skin. (Patient not taking: Reported on 01/21/2018) 28 patch 1     Physical Exam: Blood pressure 120/60, pulse 90, temperature 99.1 F (37.3 C), temperature source Oral, resp. rate 18, height _0  (1.626 m), weight 131.5 kg (290 lb), last menstrual period 11/11/2011, SpO2 95 %. General: morbidly obese white female who is laying in bed and appears minimally drowsy from medications, but is alert and able to appropriately answer questoins HEENT: head is normocephalic, atraumatic.  Sclera are noninjected.  PERRL.  Ears and nose without any masses or lesions.  Mouth is pink but dry Heart: regular, rate, and rhythm.  Normal s1,s2. No obvious murmurs, gallops, or rubs noted.  Palpable radial pulses bilaterally Lungs: CTAB, no wheezes, rhonchi, or rales noted.  Respiratory effort nonlabored on 4L of O2, which is her baseline Abd: soft, NT, morbidly obese, +BS, no masses, hernias appreciated but difficult to assess given patient's body habitus. Skin/GU: Mons pubis with edema and some erythema and induration increasing the closer towards the pelvis.  Just superior and lateral to the left vulva is an area of necrotic tissue measuring about 2x1.5cm in size with frankly purulent drainage that is expressed from the tissue breakdown.  Her left vulva is severely indurated and erythematous.  Psych: A&Ox3 with an appropriate affect, but slightly drowsy.   Results for orders placed or performed during the hospital encounter of 01/19/18 (from the past 48 hour(s))  Glucose, capillary     Status: Abnormal   Collection  Time: 01/20/18  4:31 PM  Result Value Ref Range   Glucose-Capillary 453 (H) 70 - 99 mg/dL  Glucose, capillary      Status: Abnormal   Collection Time: 01/20/18  9:27 PM  Result Value Ref Range   Glucose-Capillary 489 (H) 70 - 99 mg/dL  CBC     Status: Abnormal   Collection Time: 01/21/18  3:17 AM  Result Value Ref Range   WBC 20.8 (H) 4.0 - 10.5 K/uL   RBC 3.87 3.87 - 5.11 MIL/uL   Hemoglobin 10.4 (L) 12.0 - 15.0 g/dL   HCT 33.8 (L) 36.0 - 46.0 %   MCV 87.3 78.0 - 100.0 fL   MCH 26.9 26.0 - 34.0 pg   MCHC 30.8 30.0 - 36.0 g/dL   RDW 18.1 (H) 11.5 - 15.5 %   Platelets 167 150 - 400 K/uL    Comment: Performed at [facility], [place] [address] [place], [place] [address]  Comprehensive metabolic panel     Status: Abnormal   Collection Time: 01/21/18  3:17 AM  Result Value Ref Range   Sodium 130 (L) 135 - 145 mmol/L   Potassium 4.1 3.5 - 5.1 mmol/L   Chloride 95 (L) 98 - 111 mmol/L    Comment: Please note change in reference range.   CO2 23 22 - 32 mmol/L   Glucose, Bld 488 (H) 70 - 99 mg/dL    Comment: Please note change in reference range.   BUN 55 (H) 6 - 20 mg/dL    Comment: Please note change in reference range.   Creatinine, Ser 1.72 (H) 0.44 - 1.00 mg/dL   Calcium 8.3 (L) 8.9 - 10.3 mg/dL   Total Protein 7.5 6.5 - 8.1 g/dL   Albumin 2.7 (L) 3.5 - 5.0 g/dL   AST 22 15 - 41 U/L   ALT 27 0 - 44 U/L    Comment: Please note change in reference range.   Alkaline Phosphatase 138 (H) 38 - 126 U/L   Total Bilirubin 1.2 0.3 - 1.2 mg/dL   GFR calc non Af Amer 34 (L) >60 mL/min   GFR calc Af Amer 39 (L) >60 mL/min    Comment: (NOTE) The eGFR has been calculated using the CKD EPI equation. This calculation has not been validated in all clinical situations. eGFR's persistently <60 mL/min signify possible Chronic Kidney Disease.    Anion gap 12 5 - 15    Comment: Performed at [facility] [address], [place], [place] [address]  Glucose, capillary     Status: Abnormal   Collection Time: 01/21/18  6:11 AM  Result Value Ref Range   Glucose-Capillary 475 (H) 70 - 99 mg/dL    Glucose, capillary     Status: Abnormal   Collection Time: 01/21/18  8:45 AM  Result Value Ref Range   Glucose-Capillary 470 (H) 70 - 99 mg/dL   Comment 1 Notify RN    Comment 2 Document in Chart   Glucose, capillary     Status: Abnormal   Collection Time: 01/21/18 11:40 AM  Result Value Ref Range   Glucose-Capillary 530 (HH) 70 - 99 mg/dL   Comment 1 Notify RN    Comment 2 Document in Chart   Glucose, capillary     Status: Abnormal   Collection Time: 01/21/18  4:27 PM  Result Value Ref Range   Glucose-Capillary 553 (HH) 70 - 99 mg/dL   Comment 1 Notify RN    Comment 2  Document in Chart   Glucose, capillary     Status: Abnormal   Collection Time: 01/21/18  8:06 PM  Result Value Ref Range   Glucose-Capillary 592 (HH) 70 - 99 mg/dL   Comment 1 Document in Chart   Glucose, capillary     Status: Abnormal   Collection Time: 01/22/18  2:20 AM  Result Value Ref Range   Glucose-Capillary 400 (H) 70 - 99 mg/dL  CBC     Status: Abnormal   Collection Time: 01/22/18  3:41 AM  Result Value Ref Range   WBC 19.9 (H) 4.0 - 10.5 K/uL   RBC 3.83 (L) 3.87 - 5.11 MIL/uL   Hemoglobin 10.2 (L) 12.0 - 15.0 g/dL   HCT 33.1 (L) 36.0 - 46.0 %   MCV 86.4 78.0 - 100.0 fL   MCH 26.6 26.0 - 34.0 pg   MCHC 30.8 30.0 - 36.0 g/dL   RDW 18.1 (H) 11.5 - 15.5 %   Platelets 183 150 - 400 K/uL    Comment: Performed at [facility], [address], [place], [place] [address]  Comprehensive metabolic panel     Status: Abnormal   Collection Time: 01/22/18  3:41 AM  Result Value Ref Range   Sodium 131 (L) 135 - 145 mmol/L   Potassium 4.0 3.5 - 5.1 mmol/L   Chloride 97 (L) 98 - 111 mmol/L    Comment: Please note change in reference range.   CO2 22 22 - 32 mmol/L   Glucose, Bld 381 (H) 70 - 99 mg/dL    Comment: Please note change in reference range.   BUN 56 (H) 6 - 20 mg/dL    Comment: Please note change in reference range.   Creatinine, Ser 1.71 (H) 0.44 - 1.00 mg/dL   Calcium 8.2 (L) 8.9 - 10.3  mg/dL   Total Protein 7.2 6.5 - 8.1 g/dL   Albumin 2.4 (L) 3.5 - 5.0 g/dL   AST 25 15 - 41 U/L   ALT 28 0 - 44 U/L    Comment: Please note change in reference range.   Alkaline Phosphatase 167 (H) 38 - 126 U/L   Total Bilirubin 1.1 0.3 - 1.2 mg/dL   GFR calc non Af Amer 34 (L) >60 mL/min   GFR calc Af Amer 39 (L) >60 mL/min    Comment: (NOTE) The eGFR has been calculated using the CKD EPI equation. This calculation has not been validated in all clinical situations. eGFR's persistently <60 mL/min signify possible Chronic Kidney Disease.    Anion gap 12 5 - 15    Comment: Performed at [facility] [address], [place], [place] [address]  Glucose, capillary     Status: Abnormal   Collection Time: 01/22/18  6:20 AM  Result Value Ref Range   Glucose-Capillary 272 (H) 70 - 99 mg/dL  Glucose, capillary     Status: Abnormal   Collection Time: 01/22/18  8:08 AM  Result Value Ref Range   Glucose-Capillary 270 (H) 70 - 99 mg/dL   Comment 1 Notify RN    Comment 2 Document in Chart   Glucose, capillary     Status: Abnormal   Collection Time: 01/22/18 11:11 AM  Result Value Ref Range   Glucose-Capillary 267 (H) 70 - 99 mg/dL   No results found.    Assessment/Plan Necrotizing soft tissue infection of Mons/vulva Patient appears to have a necrotizing soft tissue infection of her genitals.  She will require operative intervention to  debride this area.  Unfortunately, she is a very high operative risk patient given her multiple comorbidities.  She has multiple cardiac and respiratory conditions that put her at risk for post operative complications on top of treating the currently infection.  We have discussed that she may require multiple trips to the operating room for debridements.  We have discussed that her wound could be relatively small or it could become quite large.  She is aware that after surgery she will require dressing changes and I discussed what this entailed.  We also  discussed possible urine contamination given the location.  We discussed delayed wound healing secondary to her morbid obesity, tobacco abuse history, and uncontrolled DM.  Her husband was present for this conversation.  She is a DNR, but he is unaware of any other wishes she may have such as how long she would want to be on a ventilator if she was unable to be extubated post surgery, etc.  I have asked palliative care medicine to see her, hopefully before OR, to assist the family with this discussion.  She did state to me that if she can't come off a ventilator after about 2 weeks and did not anticipate coming off that she would not want a trach etc.  Timing of OR is still up in the air.  May be today, but could be early tomorrow.  Continue abx therapy.  Patient, husband, and I have a very frank discussion that I think given her current infection that if this were not treated with surgical intervention that I do not think this would improve with just abx therapy alone and ultimately would result in worsening infection and eventually sepsis and likely death.  However, we discussed her high operative risk and they are aware that she may have complications either from her wound, heart, lungs, CVA, etc with an operation.  The patient and her husband would like to proceed with surgical intervention.  CAD CHF RBBB and LAFB COPD, on 4L O2 Hypoventilation syndrome Asthma  Uncontrolled DM Tobacco abuse HTN Seizure disorder CVA, history of at age 67 History of DVTs  FEN - NPO currently until timing of surgery clarified VTE - Heparin 5000 units q8h ID - Azactam 7/12 -->  Flagyl 7/12 -->  Vancomycin 7/12-->  Henreitta Cea, [facility] 01/22/2018, 12:55 PM Pager: [phone]

## 2018-01-22 NOTE — Progress Notes (Signed)
CBG at 11:11 = 267, Pt is to get 220 units of Humulin R per MAR sliding scale but Pt refused and stated that she only gets 170 units for CBG of 267. Administered 170 units, will continue to monitor.

## 2018-01-22 NOTE — Progress Notes (Signed)
Pharmacy Antibiotic Note  [name] is a 47 y.o. female admitted on 01/19/2018 with L groin wound.  Pharmacy has been consulted for Vancomycin, Aztreonam, and Flagyl dosing.  Today is antibiotic day #4.  Pt is currently in the OR for I&D of the wound.  Cx ngtd.  Vancomycin trough is therapeutic on current regimen.  Plan: Continue Metronidazole 500 mg q8h Continue Aztreonam 1 g q8h Continue Vanc 1500 mg q24h Follow up abx LOT plans and narrow as able Monitor renal fxn, culture data, and clinical progress  Height: 5\' 4"  (162.6 cm) Weight: (!) 307 lb 15.7 oz (139.7 kg) IBW/kg (Calculated) : 54.7  Temp (24hrs), Avg:98.3 F (36.8 C), Min:97.3 F (36.3 C), Max:99.1 F (37.3 C)  Recent Labs  Lab 01/19/18 1526 01/19/18 1535 01/19/18 1718 01/19/18 2042 01/20/18 0336 01/21/18 0317 01/22/18 0341 01/22/18 1719  WBC 24.6*  --   --   --  20.9* 20.8* 19.9*  --   CREATININE 2.18*  --   --   --  1.77* 1.72* 1.71*  --   LATICACIDVEN  --  3.54* 3.06* 1.50 1.8  --   --   --   VANCOTROUGH  --   --   --   --   --   --   --  17    Estimated Creatinine Clearance: 55.7 mL/min (A) (by C-G formula based on SCr of 1.71 mg/dL (H)).    Allergies  Allergen Reactions  . Adhesive [Tape] Rash and Other (See Comments)    TAKES OFF THE SKIN (CERTAIN MEDICAL TAPES DO THIS!!)  . Metoprolol Shortness Of Breath    Occurrence of shortness of breath after 3 days  . Montelukast Shortness Of Breath  . Morphine Sulfate Anaphylaxis, Shortness Of Breath and Nausea And Vomiting    Swollen Throat - Able to tolerate dilaudid  . Penicillins Anaphylaxis, Hives and Shortness Of Breath    Throat swells Has patient had a PCN reaction causing immediate rash, facial/tongue/throat swelling, SOB or lightheadedness with hypotension: Yes Has patient had a PCN reaction causing severe rash involving mucus membranes or skin necrosis: No Has patient had a PCN reaction that required hospitalization: Yes Has patient had a PCN  reaction occurring within the last 10 years: No If all of the above answers are "NO", then may proceed with Cephalosporin use.   . Prednisone Anaphylaxis  . Diltiazem Swelling  . Gabapentin Swelling    Antimicrobials this admission: Vancomycin 7/12>> Metronidazole 7/12>> Aztreonam 7/12>>  Dose adjustments this admission: 7/15 VT = 17, no change  Microbiology results: 7/12 blood x 2 >> ngtd  Thank you for allowing pharmacy to be a part of this patient's care.  [employer], Pharm.D., BCPS Clinical Pharmacist Pager: [phone] Clinical phone for 01/22/2018 is [phone].  **Pharmacist phone directory can now be found on [email or web address] ([email or web address]).  Listed under [place].  01/22/2018 7:27 PM

## 2018-01-22 NOTE — Progress Notes (Addendum)
Inpatient Diabetes Program Recommendations  AACE/ADA: New Consensus Statement on Inpatient Glycemic Control (2015)  Target Ranges:  Prepandial:   less than 140 mg/dL      Peak postprandial:   less than 180 mg/dL (1-2 hours)      Critically ill patients:  140 - 180 mg/dL   Lab Results  Component Value Date   GLUCAP 267 (H) 01/22/2018   HGBA1C 10.4 (H) 01/20/2018    Review of Glycemic ControlResults for [name], [name] (MRN [id number]) as of 01/22/2018 12:23  Ref. Range 01/21/2018 20:06 01/22/2018 02:20 01/22/2018 06:20 01/22/2018 08:08 01/22/2018 11:11  Glucose-Capillary Latest Ref Range: 70 - 99 mg/dL 592 (HH) 400 (H) 272 (H) 270 (H) 267 (H)    Diabetes history: Type 2 DM- See's Dr. Loanne Drilling Outpatient Diabetes medications: U500- 400 units bid  Current orders for Inpatient glycemic control:  U500- 400 units bid  Inpatient Diabetes Program Recommendations:    Note patient did not receive U500 insulin this morning.  Called and discussed with RN and explained that dose is the same and that patient likely needs at least 1/2 of U500 with lunch.  She states that she will call MD to discuss.    Per Dr. Jonnie Finner note, patient states that she does not feel well with blood sugars less than 200 mg/dL.  This is common with patients who have chronically high blood sugars.  Often patients have to "push through" in order to truly feel well with a normal blood sugar.  Will talk with patient today.    Thanks,  Adah Perl, RN, BC-ADM Inpatient Diabetes Coordinator Pager [phone] (8a-5p)  1500 Spoke with patient and RN.  Note that U500 doses have been changed to more of a sliding scale based on blood sugars.  Patient very sleepy but I was finally able to wake up.  She states that she does not feel well with blood sugars less than 200 mg/dL.  She is currently NPO for surgery.  RN states that she gave patient 170 units of U500 at lunch time.  I discussed with patient that she really needs blood sugars  less than 180 mg/dL for wound healing.  She verbalized understanding.  Will f/u on 01/23/18.

## 2018-01-22 NOTE — Progress Notes (Signed)
Patient placed on NIV per documented settings at this time, tolerating well, RCP will contnue to follow.

## 2018-01-22 NOTE — Progress Notes (Signed)
Received patient from PACU/RT on BIPAP 20/6 40%.  Spoke with RN regarding BIPAP order. Patient tolerating well at this time.

## 2018-01-22 NOTE — Anesthesia Procedure Notes (Signed)
Procedure Name: Intubation Date/Time: 01/22/2018 6:40 PM Performed by: Ashmi Blas T, CRNA Pre-anesthesia Checklist: Patient identified, Emergency Drugs available, Suction available and Patient being monitored Patient Re-evaluated:Patient Re-evaluated prior to induction Oxygen Delivery Method: Circle system utilized Preoxygenation: Pre-oxygenation with 100% oxygen Induction Type: IV induction Ventilation: Mask ventilation without difficulty Laryngoscope Size: Mac and 3 Grade View: Grade I Tube type: Oral Tube size: 7.0 mm Number of attempts: 1 Airway Equipment and Method: Patient positioned with wedge pillow and Stylet Placement Confirmation: ETT inserted through vocal cords under direct vision,  positive ETCO2 and breath sounds checked- equal and bilateral Secured at: 23 cm Tube secured with: Tape Dental Injury: Teeth and Oropharynx as per pre-operative assessment

## 2018-01-22 NOTE — Plan of Care (Signed)

## 2018-01-22 NOTE — Consult Note (Signed)
[facility] Nurse wound consult note Reason for Consult: Cellulitis to mons pubis and left labia.   Wound type: INfectious Pressure Injury POA: NA Measurement: MOns pubis and left labia are erythematous and tender to touch.  Is keeping a fan on this area.  MOsiture wicking fabric in inguinal folds in place.  Wound YME:BRAXENMM and edema to left labial area and left abdominal pannuis Drainage (amount, consistency, odor) minimal serosanguinous  Musty odor Periwound:edema and erythema Dressing procedure/placement/frequency: Cleanse perineal and abdominal pannus areas twice daily with soap and water.  COntinue Interdry Ag to wick moisture.  Measure and cut length of InterDry Ag+ to fit in skin folds that have skin breakdown Tuck InterDry  Ag+ fabric into skin folds in a single layer, allow for 2 inches of overhang from skin edges to allow for wicking to occur May remove to bathe; dry area thoroughly and then tuck into affected areas again Do not apply any creams or ointments when using InterDry Ag+ DO NOT THROW AWAY FOR 5 DAYS unless soiled with stool DO NOT [facility] product, this will inactivate the silver in the material  New sheet of Interdry Ag+ should be applied after 5 days of use if patient continues to have skin breakdown   Will not follow at this time.  Please re-consult if needed.  Domenic Moras RN BSN [place] Pager [phone]

## 2018-01-22 NOTE — Anesthesia Postprocedure Evaluation (Signed)
Anesthesia Post Note  Patient: [name]  Procedure(s) Performed: IRRIGATION AND DEBRIDEMENT LABIAL/VULVAR AREA (N/A Perineum)     Patient location during evaluation: PACU Anesthesia Type: General Pain management: pain level controlled Vital Signs Assessment: post-procedure vital signs reviewed and stable Respiratory status: spontaneous breathing, nonlabored ventilation, respiratory function stable and patient connected to nasal cannula oxygen Cardiovascular status: blood pressure returned to baseline and stable Postop Assessment: no apparent nausea or vomiting Anesthetic complications: no Comments: Somnolent. PaCO2 52. Applied BiPAP, recommend overnight. Current O2 Sat 96% (baseline 90)    Last Vitals:  Vitals:   01/22/18 2042 01/22/18 2100  BP: 109/62 116/63  Pulse: 97 94  Resp: 20 19  Temp:  (!) 36.3 C  SpO2: 96% 97%    Last Pain:  Vitals:   01/22/18 2100  TempSrc:   PainSc: Asleep                 Effie Berkshire

## 2018-01-22 NOTE — Progress Notes (Signed)
Awaiting respiratory to transport

## 2018-01-22 NOTE — Telephone Encounter (Signed)
Refills sent to pharmacy. Patient admitted to [facility] currently. Will you please remove her from the Monday schedule?

## 2018-01-23 ENCOUNTER — Encounter (HOSPITAL_COMMUNITY): Payer: Self-pay | Admitting: Surgery

## 2018-01-23 DIAGNOSIS — Z66 Do not resuscitate: Secondary | ICD-10-CM | POA: Diagnosis present

## 2018-01-23 DIAGNOSIS — S3140XA Unspecified open wound of vagina and vulva, initial encounter: Secondary | ICD-10-CM | POA: Diagnosis not present

## 2018-01-23 DIAGNOSIS — M7989 Other specified soft tissue disorders: Secondary | ICD-10-CM | POA: Diagnosis present

## 2018-01-23 DIAGNOSIS — N183 Chronic kidney disease, stage 3 unspecified: Secondary | ICD-10-CM | POA: Diagnosis present

## 2018-01-23 DIAGNOSIS — N764 Abscess of vulva: Secondary | ICD-10-CM

## 2018-01-23 DIAGNOSIS — E1165 Type 2 diabetes mellitus with hyperglycemia: Secondary | ICD-10-CM | POA: Diagnosis present

## 2018-01-23 DIAGNOSIS — E119 Type 2 diabetes mellitus without complications: Secondary | ICD-10-CM | POA: Diagnosis present

## 2018-01-23 DIAGNOSIS — E1122 Type 2 diabetes mellitus with diabetic chronic kidney disease: Secondary | ICD-10-CM | POA: Diagnosis present

## 2018-01-23 HISTORY — DX: Abscess of vulva: N76.4

## 2018-01-23 LAB — CBC
HCT: 34.9 % — ABNORMAL LOW (ref 36.0–46.0)
Hemoglobin: 10.6 g/dL — ABNORMAL LOW (ref 12.0–15.0)
MCH: 26.6 pg (ref 26.0–34.0)
MCHC: 30.4 g/dL (ref 30.0–36.0)
MCV: 87.5 fL (ref 78.0–100.0)
Platelets: 227 10*3/uL (ref 150–400)
RBC: 3.99 MIL/uL (ref 3.87–5.11)
RDW: 18.5 % — ABNORMAL HIGH (ref 11.5–15.5)
WBC: 23.1 10*3/uL — ABNORMAL HIGH (ref 4.0–10.5)

## 2018-01-23 LAB — BASIC METABOLIC PANEL
Anion gap: 11 (ref 5–15)
BUN: 49 mg/dL — ABNORMAL HIGH (ref 6–20)
CO2: 22 mmol/L (ref 22–32)
Calcium: 8.6 mg/dL — ABNORMAL LOW (ref 8.9–10.3)
Chloride: 103 mmol/L (ref 98–111)
Creatinine, Ser: 1.36 mg/dL — ABNORMAL HIGH (ref 0.44–1.00)
GFR calc Af Amer: 52 mL/min — ABNORMAL LOW (ref >60)
GFR calc non Af Amer: 45 mL/min — ABNORMAL LOW (ref >60)
Glucose, Bld: 86 mg/dL (ref 70–99)
Potassium: 4.5 mmol/L (ref 3.5–5.1)
Sodium: 136 mmol/L (ref 135–145)

## 2018-01-23 LAB — GLUCOSE, CAPILLARY
Glucose-Capillary: 88 mg/dL (ref 70–99)
Glucose-Capillary: 152 mg/dL — ABNORMAL HIGH (ref 70–99)
Glucose-Capillary: 197 mg/dL — ABNORMAL HIGH (ref 70–99)

## 2018-01-23 MED ORDER — HYDROMORPHONE HCL 1 MG/ML IJ SOLN
0.5000 mg | INTRAMUSCULAR | Status: DC | PRN
  Administered 2018-01-25 – 2018-01-27 (×5): 0.5 mg via INTRAVENOUS
  Filled 2018-01-23 (×5): qty 0.5

## 2018-01-23 MED ORDER — OXYCODONE HCL 5 MG PO TABS
5.0000 mg | ORAL_TABLET | ORAL | Status: DC | PRN
  Administered 2018-01-23 – 2018-01-28 (×8): 10 mg via ORAL
  Administered 2018-01-28: 5 mg via ORAL
  Administered 2018-01-28 – 2018-02-05 (×29): 10 mg via ORAL
  Filled 2018-01-23 (×33): qty 2
  Filled 2018-01-23: qty 1
  Filled 2018-01-23 (×4): qty 2

## 2018-01-23 MED ORDER — ACETAMINOPHEN 325 MG PO TABS
650.0000 mg | ORAL_TABLET | ORAL | Status: DC
  Administered 2018-01-23 – 2018-01-26 (×13): 650 mg via ORAL
  Filled 2018-01-23 (×14): qty 2

## 2018-01-23 NOTE — Telephone Encounter (Signed)
Noted. Appointment has been canceled.

## 2018-01-23 NOTE — Progress Notes (Signed)
Pt. Was complaining about BIPAP and was thirsty.  I took her off and put her on 4L [place].  She is on 4L at home.  She is Satting 95-98% on 4L.  Paged MD [place] and he said to keep her on 4L with BIPAP on standby.

## 2018-01-23 NOTE — Progress Notes (Signed)
Attempted to place foley at this time with the assistance of 3 additional staff members; pt's vagina too swollen and pt unable to tolerate 2/2 increased pain. Outer part of dressing removed D/T soiling/saturation, replaced with new gauze, ABD pad.

## 2018-01-23 NOTE — Progress Notes (Signed)
Pt. On Bipap.  CBG was 88.  Reported that she is symptomatic below 200.  I took her off bipap get some juice in her.  She drank the juice but quickly desatted to 85%.  Put Bipap back on she is at 98%.  She is still pretty lethargic from pain meds this morning.  I am going to hold her PO meds until she wakes up a little more.

## 2018-01-23 NOTE — Progress Notes (Signed)
Patient resting on 4L [place]. No distress noted. BIPAP on stand by at bedside.

## 2018-01-23 NOTE — Progress Notes (Signed)
[facility]                                                                              Patient Demographics  [name], is a 47 y.o. female, DOB - May 09, 1971, NIO:[id number]  Admit date - 01/19/2018   Admitting Physician Debbe Odea, MD  Outpatient Primary MD for the patient is Pleas Koch, NP  Outpatient specialists:   LOS - 4  days    Chief Complaint  Patient presents with  . Cellulitis       Brief summary   [name] is a 47 y.o. female with medical history significant for but not limited to poorly controlled resistant t2dm, morbid obesity, copd with possible superimposed obesity hypoventilation syndrome, on chronic O 2 (2L during the day, 4 at night), osa on nightly cpcp, htn, dCHF, and CAD  Presenting with 4 day history of vulvar pain,  And limited for uncontrolled diabetes with mons pubis cellulitis and secondary sepsis.  She was given 2 L of normal saline and antibiotics initiated at the ED prior to her admission   Subjective:   [name] seen in room, drowsy from pain medications, on bipap, they are getting ready to do dressing change per RN.  Greatly appreciate care of gen surg and palliative team yesterday.   Assessment & Plan    Principal Problem:   Necrotizing soft tissue infection of Mons/ vulva Active Problems:   Morbid (severe) obesity due to excess calories (HCC)   Tobacco abuse   COPD GOLD 0    Seizure disorder (HCC)   Bipolar disorder (HCC)   Sleep apnea   Essential hypertension   Diabetes (HCC)   IBS (irritable bowel syndrome)   Sepsis (HCC)   Elevated serum hCG    1. Necrotizing soft tissue infection of Mons/ vulva- went to OR yest - SP I&D labial/ vulvar area 01/22/18 by Gen Surgery - pcn allergic, continue  IV aztreonam / metronidazole/vancomycin - patient w/ many chronic comorbidities and prob high risk for complications - seen by palliative care 7/15, patient wants the following:  - DNR    - No  trach  - Does not want to be re-intubated.   - No feeding tubes  - Not keen on dialysis; not a good long term candidate regardless given  co-morbidities and overall weakness - BP's remaining stable post-op, SBP's are > 100       2. Uncontrolled type 2 diabetes mellitus: - Holding home metformin - doesn't appear to be on any long-acting insulin at home - Right now we are following approximately what pt uses at home, high dose BID SSI starting at [facility] > 200; diabetes team is consulting and we will prob need to make changes because for wound healing need to keep BS < 180   3. Acute on chronic kidney injury: better - creatinine at 1.7 now, baseline is 1.4- 1.8 (CKD III)  - diuretics on hold (metolazone, torsemide and Aldactone)  - dc'd nsaids  - lowered IVF"s to 50 cc/hr to avoid vol overload  - vol stable on exam today  4. elevated HCG:  has not had sex  for 4-5 years now  follow clinically -  Consider outpatient workup if persistent   5. COPD / OSA:    - sees pulm in office for COPD gold stage 0 w nocturnal hypoxemia (on noct O2 at 3L as of sept 2018 office visit)  - Supportive care, p.r.n. Albuterol, on symbicort at home  - no wheezing here  - no resp distress - on Bipap prophylactic now post-op due to need for sedating IV pain meds   6.  Chronic diastolic CHF:   - admitted here by cardiology once in 2017 and once late 2018 for dCHF exacerbation - diuretics on hold in view of sepsis - get daily wts, - need better I/O  - decrease IVF"s somewhat w CKD  7.  DNR:  Seen by palliative care 7/15, see their notes, remains DNR and no re-intubation   Code Status:  DNR DVT Prophylaxis: heparin  Family Communication: none at bedside Disposition Plan:  Home   Rob Jonnie Finner MD [facility] pgr [phone] 12/03/2017, 9:25 AM   Scheduled Meds: . acetaminophen  650 mg Oral Q4H  . busPIRone  30 mg Oral BID  . famotidine  20 mg Oral Daily  . FLUoxetine  40 mg Oral Daily   . heparin  5,000 Units Subcutaneous Q8H  .  HYDROmorphone (DILAUDID) injection  1 mg Intravenous Once  . insulin regular human CONCENTRATED  170-370 Units Subcutaneous BID WC  . linaclotide  290 mcg Oral QAC breakfast  . mometasone-formoterol  2 puff Inhalation BID  . pregabalin  300 mg Oral BID  . traZODone  150 mg Oral QHS   Continuous Infusions: . sodium chloride 50 mL/hr at 01/23/18 0053  . aztreonam 1 g (01/23/18 0050)  . metronidazole 500 mg (01/23/18 0122)  . vancomycin 0 mL/hr at 01/21/18 1935   PRN Meds:.albuterol, ALPRAZolam, HYDROmorphone (DILAUDID) injection, oxyCODONE   Antibiotics   Anti-infectives (From admission, onward)   Start     Dose/Rate Route Frequency Ordered Stop   01/19/18 1800  vancomycin (VANCOCIN) 1,500 mg in sodium chloride 0.9 % 500 mL IVPB     1,500 mg 250 mL/hr over 120 Minutes Intravenous Every 24 hours 01/19/18 1639     01/19/18 1700  aztreonam (AZACTAM) 1 g in sodium chloride 0.9 % 100 mL IVPB     1 g 200 mL/hr over 30 Minutes Intravenous Every 8 hours 01/19/18 1639     01/19/18 1700  metroNIDAZOLE (FLAGYL) IVPB 500 mg     500 mg 100 mL/hr over 60 Minutes Intravenous Every 8 hours 01/19/18 1639            Objective:   Vitals:   01/23/18 0100 01/23/18 0128 01/23/18 0501 01/23/18 0907  BP:   (!) 109/57   Pulse: 85     Resp: 16  19 (!) 21  Temp:   98.8 F (37.1 C)   TempSrc:   Axillary   SpO2: 97% 99% 98%   Weight:   (!) 140.6 kg (309 lb 15.5 oz)   Height:        Intake/Output Summary (Last 24 hours) at 01/23/2018 1029 Last data filed at 01/23/2018 0926 Gross per 24 hour  Intake 2323.11 ml  Output 820 ml  Net 1503.11 ml     Wt Readings from Last 3 Encounters:  01/23/18 (!) 140.6 kg (309 lb 15.5 oz)  01/08/18 131.5 kg (290 lb)  12/21/17 127.9 kg (282 lb)     Exam  General: NAD  HEENT:  NCAT,  PERRL,MMM  Neck: SUPPLE, (-) JVD  Cardiovascular: RRR, (-) GALLOP, (-) MURMUR  Respiratory: CTA  Gastrointestinal:  SOFT, (-) DISTENSION, BS(+), (_) TENDERNESS  Ext: (-) CYANOSIS, (-) EDEMA  Neuro: A, OX 3  Skin: erythema/induration area of new fluctuance and pus draining from L mons pubis area   Data Reviewed:  I have personally reviewed following labs and imaging studies  Micro Results Recent Results (from the past 240 hour(s))  Blood Culture (routine x 2)     Status: None (Preliminary result)   Collection Time: 01/19/18  4:26 PM  Result Value Ref Range Status   Specimen Description BLOOD SITE NOT SPECIFIED  Final   Special Requests   Final    BOTTLES DRAWN AEROBIC AND ANAEROBIC Blood Culture adequate volume   Culture   Final    NO GROWTH 3 DAYS Performed at [facility], [address], [place], [place] [address]    Report Status PENDING  Incomplete  Blood Culture (routine x 2)     Status: None (Preliminary result)   Collection Time: 01/19/18  4:31 PM  Result Value Ref Range Status   Specimen Description BLOOD BLOOD RIGHT FOREARM  Final   Special Requests   Final    BOTTLES DRAWN AEROBIC AND ANAEROBIC Blood Culture adequate volume   Culture   Final    NO GROWTH 3 DAYS Performed at [facility], [place] [address] [place], [place] [address]    Report Status PENDING  Incomplete  Surgical pcr screen     Status: None   Collection Time: 01/22/18  1:27 PM  Result Value Ref Range Status   MRSA, PCR NEGATIVE NEGATIVE Final   Staphylococcus aureus NEGATIVE NEGATIVE Final    Comment: (NOTE) The Xpert SA Assay (FDA approved for NASAL specimens in patients 37 years of age and older), is one component of a comprehensive surveillance program. It is not intended to diagnose infection nor to guide or monitor treatment. Performed at [facility], [place] [address], [place], [place] [address]   Aerobic/Anaerobic Culture (surgical/deep wound)     Status: None (Preliminary result)   Collection Time: 01/22/18  6:49 PM  Result Value Ref Range Status   Specimen Description ABSCESS LABIA   Final   Special Requests PATIENT ON FOLLOWING VANC FLAGYL AZTREONAM  Final   Gram Stain   Final    MODERATE WBC PRESENT, PREDOMINANTLY PMN ABUNDANT GRAM NEGATIVE RODS ABUNDANT GRAM POSITIVE COCCI Performed at [facility], [address], [place], [place] [address]    Culture PENDING  Incomplete   Report Status PENDING  Incomplete    Radiology Reports Dg Chest 2 View  Result Date: 01/19/2018 CLINICAL DATA:  Pelvic infection.  No chest complaints. EXAM: CHEST - 2 VIEW COMPARISON:  Chest x-ray dated October 19, 2017. FINDINGS: The heart remains at the upper limits of normal in size. Normal pulmonary vascularity. Mild chronic bronchitic changes are again noted. Unchanged eventration of the right hemidiaphragm. Linear atelectasis in the right middle lobe. No focal consolidation, pleural effusion, or pneumothorax. No acute osseous abnormality. IMPRESSION: Chronic bronchitic changes.  No active cardiopulmonary disease. Electronically Signed   By: Titus Dubin M.D.   On: 01/19/2018 15:59   Dg Lumbar Spine Complete W/bend  Result Date: 01/15/2018 CLINICAL DATA:  Chronic lumbago with bilateral radicular symptoms EXAM: LUMBAR SPINE - COMPLETE WITH BENDING VIEWS COMPARISON:  None. FINDINGS: Frontal, neutral lateral, flexion lateral, extension lateral, spot lumbosacral lateral, and bilateral  oblique-total 7-views obtained. There are 5 non-rib-bearing lumbar type vertebral bodies. Bony mineralization is within normal limits. There is no fracture. There is no spondylolisthesis on neutral lateral imaging. There is no change in lateral alignment with flexion-extension. Patient shows essentially normal range of motion with flexion and extension. The disc spaces in the lumbar region appear normal. There are anterior osteophytes at T10, T11, T12, and L1. There is facet osteoarthritic change at L3-4, L4-5, and L5-S1 on the left. Other facets appear unremarkable. There is aortic atherosclerosis. IMPRESSION:  Areas of facet osteoarthritic change on the left. No fracture or spondylolisthesis. No appreciable change in lateral alignment with flexion-extension. No appreciable disc space narrowing in the lumbar regions. There is aortic atherosclerosis. Aortic Atherosclerosis (ICD10-I70.0). Electronically Signed   By: Lowella Grip III M.D.   On: 01/15/2018 14:03   Ct Pelvis Wo Contrast  Result Date: 01/19/2018 CLINICAL DATA:  Rash of the left side of the groin. Lump of the vagina. EXAM: CT PELVIS WITHOUT CONTRAST TECHNIQUE: Multidetector CT imaging of the pelvis was performed following the standard protocol without intravenous contrast. COMPARISON:  03/05/2010 FINDINGS: Urinary Tract: No bladder abnormality seen. Distal ureters appear negative. Inferior renal poles appear negative. Bowel: Sigmoid diverticulosis without evidence of diverticulitis. No other bowel finding. Vascular/Lymphatic: Atherosclerosis of the aorta and the iliac arteries. No aneurysm. Reproductive: No uterine or adnexal mass or inflammatory change. No abnormality of the vagina identified. Other: Edema and swelling of the mons pubis on the left and the lower left abdominal panniculus consistent with cellulitis. No evidence of drainable deep abscess. Musculoskeletal: Ordinary lower lumbar degenerative changes. Other bones of the region appear normal. IMPRESSION: Swelling and edema of the mons pubis and the lower left abdominal panniculus on the left consistent with cellulitis. No evidence of drainable abscess. Electronically Signed   By: Nelson Chimes M.D.   On: 01/19/2018 17:32    Lab Data:  CBC: Recent Labs  Lab 01/19/18 1526 01/20/18 0336 01/21/18 0317 01/22/18 0341 01/22/18 2026  WBC 24.6* 20.9* 20.8* 19.9*  --   NEUTROABS 21.4*  --   --   --   --   HGB 11.7* 11.1* 10.4* 10.2* 11.2*  HCT 36.3 34.2* 33.8* 33.1* 33.0*  MCV 83.8 83.6 87.3 86.4  --   PLT 186 168 167 183  --    Basic Metabolic Panel: Recent Labs  Lab  01/19/18 1526 01/20/18 0336 01/21/18 0317 01/22/18 0341 01/22/18 2026  NA 124* 127* 130* 131* 134*  K 3.7 3.6 4.1 4.0 4.0  CL 79* 85* 95* 97*  --   CO2 28 28 23 22   --   GLUCOSE 442* 516* 488* 381*  --   BUN 60* 56* 55* 56*  --   CREATININE 2.18* 1.77* 1.72* 1.71*  --   CALCIUM 8.7* 8.6* 8.3* 8.2*  --    GFR: Estimated Creatinine Clearance: 56 mL/min (A) (by C-G formula based on SCr of 1.71 mg/dL (H)). Liver Function Tests: Recent Labs  Lab 01/19/18 1526 01/21/18 0317 01/22/18 0341  AST 28 22 25   ALT 25 27 28   ALKPHOS 118 138* 167*  BILITOT 1.3* 1.2 1.1  PROT 8.0 7.5 7.2  ALBUMIN 3.2* 2.7* 2.4*   No results for input(s): LIPASE, AMYLASE in the last 168 hours. No results for input(s): AMMONIA in the last 168 hours. Coagulation Profile: No results for input(s): INR, PROTIME in the last 168 hours. Cardiac Enzymes: No results for input(s): CKTOTAL, CKMB, CKMBINDEX, TROPONINI in the last 168 hours.  BNP (last 3 results) No results for input(s): PROBNP in the last 8760 hours. HbA1C: No results for input(s): HGBA1C in the last 72 hours. CBG: Recent Labs  Lab 01/22/18 1111 01/22/18 1528 01/22/18 1630 01/22/18 1930 01/23/18 0803  GLUCAP 267* 238* 205* 137* 88   Lipid Profile: No results for input(s): CHOL, HDL, LDLCALC, TRIG, CHOLHDL, LDLDIRECT in the last 72 hours. Thyroid Function Tests: No results for input(s): TSH, T4TOTAL, FREET4, T3FREE, THYROIDAB in the last 72 hours. Anemia Panel: No results for input(s): VITAMINB12, FOLATE, FERRITIN, TIBC, IRON, RETICCTPCT in the last 72 hours. Urine analysis:    Component Value Date/Time   COLORURINE AMBER (A) 01/19/2018 2312   APPEARANCEUR TURBID (A) 01/19/2018 2312   LABSPEC 1.013 01/19/2018 2312   PHURINE 5.0 01/19/2018 2312   GLUCOSEU 150 (A) 01/19/2018 2312   HGBUR LARGE (A) 01/19/2018 2312   HGBUR negative 03/07/2008 1643   BILIRUBINUR NEGATIVE 01/19/2018 2312   BILIRUBINUR NEG 11/03/2015 1035   KETONESUR  NEGATIVE 01/19/2018 2312   PROTEINUR 100 (A) 01/19/2018 2312   UROBILINOGEN 0.2 11/03/2015 1035   UROBILINOGEN 0.2 10/08/2011 1850   NITRITE NEGATIVE 01/19/2018 2312   LEUKOCYTESUR MODERATE (A) 01/19/2018 2312

## 2018-01-23 NOTE — Progress Notes (Signed)
[facility]  Pt is active with [facility]- a home based Palliative Care program provided by [facility]. Will continue to support pt and family as she goes through this hospitalization. Pt sleeping this visit with bi-pap in place. Lethargic from medications given from dressing change. Webb Silversmith RN   Please call with concerns or questions. [phone]

## 2018-01-23 NOTE — Progress Notes (Signed)
Patient ID: [name], female   DOB: 04/01/1971, 47 y.o.   MRN: [id number]    1 Day Post-Op  Subjective: Patient currently on BiPap sedated secondary to pain medicine for dressing change  Objective: Vital signs in last 24 hours: Temp:  [97.2 F (36.2 C)-98.8 F (37.1 C)] 98.8 F (37.1 C) (07/16 0501) Pulse Rate:  [84-111] 85 (07/16 0100) Resp:  [13-21] 21 (07/16 0907) BP: (104-159)/(54-79) 109/57 (07/16 0501) SpO2:  [85 %-99 %] 98 % (07/16 0501) FiO2 (%):  [40 %] 40 % (07/16 0501) Weight:  [139.7 kg (307 lb 15.7 oz)-140.6 kg (309 lb 15.5 oz)] 140.6 kg (309 lb 15.5 oz) (07/16 0501) Last BM Date: 01/22/18  Intake/Output from previous day: 07/15 0701 - 07/16 0700 In: 2443.1 [P.O.:120; I.V.:148.3; IV Piggyback:2074.8] Out: 520 [Urine:500; Blood:20] Intake/Output this shift: Total I/O In: -  Out: 300 [Urine:300]  PE: GU: wound is mostly clean, but still has a foul odor to it.  Packing removed and repacked well.  Still with some induration in labia and mons.  Lab Results:  Recent Labs    01/21/18 0317 01/22/18 0341 01/22/18 2026  WBC 20.8* 19.9*  --   HGB 10.4* 10.2* 11.2*  HCT 33.8* 33.1* 33.0*  PLT 167 183  --    BMET Recent Labs    01/21/18 0317 01/22/18 0341 01/22/18 2026  NA 130* 131* 134*  K 4.1 4.0 4.0  CL 95* 97*  --   CO2 23 22  --   GLUCOSE 488* 381*  --   BUN 55* 56*  --   CREATININE 1.72* 1.71*  --   CALCIUM 8.3* 8.2*  --    PT/INR No results for input(s): LABPROT, INR in the last 72 hours. CMP     Component Value Date/Time   NA 134 (L) 01/22/2018 2026   NA 137 01/08/2018 1117   K 4.0 01/22/2018 2026   CL 97 (L) 01/22/2018 0341   CO2 22 01/22/2018 0341   GLUCOSE 381 (H) 01/22/2018 0341   BUN 56 (H) 01/22/2018 0341   BUN 31 (H) 01/08/2018 1117   CREATININE 1.71 (H) 01/22/2018 0341   CREATININE 1.20 (H) 05/24/2016 0938   CALCIUM 8.2 (L) 01/22/2018 0341   PROT 7.2 01/22/2018 0341   PROT 7.8 01/08/2018 1117   ALBUMIN 2.4 (L) 01/22/2018  0341   ALBUMIN 4.1 01/08/2018 1117   AST 25 01/22/2018 0341   ALT 28 01/22/2018 0341   ALKPHOS 167 (H) 01/22/2018 0341   BILITOT 1.1 01/22/2018 0341   BILITOT 0.5 01/08/2018 1117   GFRNONAA 34 (L) 01/22/2018 0341   GFRAA 39 (L) 01/22/2018 0341   Lipase     Component Value Date/Time   LIPASE 82 (H) 07/19/2017 1942       Studies/Results: No results found.  Anti-infectives: Anti-infectives (From admission, onward)   Start     Dose/Rate Route Frequency Ordered Stop   01/19/18 1800  vancomycin (VANCOCIN) 1,500 mg in sodium chloride 0.9 % 500 mL IVPB     1,500 mg 250 mL/hr over 120 Minutes Intravenous Every 24 hours 01/19/18 1639     01/19/18 1700  aztreonam (AZACTAM) 1 g in sodium chloride 0.9 % 100 mL IVPB     1 g 200 mL/hr over 30 Minutes Intravenous Every 8 hours 01/19/18 1639     01/19/18 1700  metroNIDAZOLE (FLAGYL) IVPB 500 mg     500 mg 100 mL/hr over 60 Minutes Intravenous Every 8 hours 01/19/18 1639  Assessment/Plan Necrotizing soft tissue infection of Mons/vulva -POD 1, s/p I&D of vulvar infection.  Wound is about 5x8x8cm in size. -decrease dilaudid to 0.5mg  q3 hrs prn to decrease sedation and breathing issues.  On BiPap currently -add tylenol scheduled as well as oxycodone -BID dressing changes -NPO p MN in case she needs to return to the OR -cont in SDU for monitoring of respiratory issues -greatly appreciate palliative care's quick evaluation and assistance with this patient and her husband yesterday!  CAD CHF RBBB and LAFB COPD, on 4L O2 Hypoventilation syndrome Asthma  Uncontrolled DM Tobacco abuse HTN Seizure disorder CVA, history of at age 27 History of DVTs  FEN - carb mod diet, NPO p MN VTE - Heparin 5000 units q8h ID - Azactam 7/12 -->  Flagyl 7/12 -->  Vancomycin 7/12-->   LOS: 4 days    Henreitta Cea , [facility] 01/23/2018, 9:46 AM Pager: [phone]

## 2018-01-23 NOTE — Progress Notes (Signed)
Palliative:  I met again today with [name] and husband, [name]. [name] gave me a big hug. [name] continues on BiPAP - I am not surprised as she was fairly lethargic yesterday even prior to surgery. She is talking and making jokes - asking to go to [facility] for some "ice [employer]." Removed BiPAP and gave some ice water. RN says her sats drop off BiPAP and I reminded them that she chronically has 2L oxygen in day and 4L at night. Discussed possibility of repeat surgery tomorrow with husband. He feels better seeing that she has done okay with first surgery. They have no further questions or concerns. Emotional support provided. I will continue to follow.   Exam: Alert, slightly confused. No distress. On 40% FiO2 BiPAP.   25 min  Vinie Sill, NP Palliative Medicine Team Pager # [phone] (M-F 8a-5p) Team Phone # [phone] (Nights/Weekends)

## 2018-01-24 ENCOUNTER — Ambulatory Visit: Payer: BLUE CROSS/BLUE SHIELD | Admitting: Pain Medicine

## 2018-01-24 ENCOUNTER — Encounter (HOSPITAL_COMMUNITY): Payer: Self-pay | Admitting: General Practice

## 2018-01-24 ENCOUNTER — Other Ambulatory Visit: Payer: Self-pay

## 2018-01-24 LAB — CULTURE, BLOOD (ROUTINE X 2)
Culture: NO GROWTH
Culture: NO GROWTH
Special Requests: ADEQUATE
Special Requests: ADEQUATE

## 2018-01-24 LAB — BASIC METABOLIC PANEL
Anion gap: 8 (ref 5–15)
BUN: 41 mg/dL — ABNORMAL HIGH (ref 6–20)
CO2: 24 mmol/L (ref 22–32)
Calcium: 9.2 mg/dL (ref 8.9–10.3)
Chloride: 108 mmol/L (ref 98–111)
Creatinine, Ser: 1.23 mg/dL — ABNORMAL HIGH (ref 0.44–1.00)
GFR calc Af Amer: 59 mL/min — ABNORMAL LOW (ref >60)
GFR calc non Af Amer: 51 mL/min — ABNORMAL LOW (ref >60)
Glucose, Bld: 214 mg/dL — ABNORMAL HIGH (ref 70–99)
Potassium: 4.7 mmol/L (ref 3.5–5.1)
Sodium: 140 mmol/L (ref 135–145)

## 2018-01-24 LAB — GLUCOSE, CAPILLARY
Glucose-Capillary: 95 mg/dL (ref 70–99)
Glucose-Capillary: 219 mg/dL — ABNORMAL HIGH (ref 70–99)
Glucose-Capillary: 281 mg/dL — ABNORMAL HIGH (ref 70–99)
Glucose-Capillary: 312 mg/dL — ABNORMAL HIGH (ref 70–99)
Glucose-Capillary: 266 mg/dL — ABNORMAL HIGH (ref 70–99)

## 2018-01-24 LAB — CBC
HCT: 33.3 % — ABNORMAL LOW (ref 36.0–46.0)
Hemoglobin: 10.1 g/dL — ABNORMAL LOW (ref 12.0–15.0)
MCH: 26.5 pg (ref 26.0–34.0)
MCHC: 30.3 g/dL (ref 30.0–36.0)
MCV: 87.4 fL (ref 78.0–100.0)
Platelets: 243 10*3/uL (ref 150–400)
RBC: 3.81 MIL/uL — ABNORMAL LOW (ref 3.87–5.11)
RDW: 18.6 % — ABNORMAL HIGH (ref 11.5–15.5)
WBC: 19.6 10*3/uL — ABNORMAL HIGH (ref 4.0–10.5)

## 2018-01-24 NOTE — Progress Notes (Signed)
Patient transferred to floor from PACU on BIPAP and remained on throughoutr the night and most of today. Placed in CPAP mode for CPAP HS per order. Setting of 10 cmH20. Decreased FIO2 to 30%. Tolerating well at this time.

## 2018-01-24 NOTE — Progress Notes (Signed)
Pt declined use of CPAP- machine at bedside for patient use 

## 2018-01-24 NOTE — Progress Notes (Addendum)
Results for PIA, JEDLICKA (MRN [id number]) as of 01/24/2018 13:43  Ref. Range 01/23/2018 17:28 01/23/2018 21:06 01/24/2018 07:48 01/24/2018 11:57  Glucose-Capillary Latest Ref Range: 70 - 99 mg/dL 152 (H) 197 (H) 219 (H) 281 (H)  Noted that U-500 insulin had not been given this am as ordered. Spoke with staff RN who stated that the insulin had not been available and needed information on how to use the U-500 insulin pen. Needed to order insulin pen needles from pharmacy. Due to late timing, insulin would need to be given at supper according to a current blood sugar at that time. (per sliding scale)  Harvel Ricks RN BSN CDE Diabetes Coordinator Pager: [phone]  8am-5pm

## 2018-01-24 NOTE — Progress Notes (Signed)
PROGRESS NOTE    [name]  YNW:[id number] DOB: May 08, 1971 DOA: 01/19/2018 PCP: Pleas Koch, NP    Brief Narrative:  47 y.o.femalewith medical history significant for but not limited to poorly controlled resistant t2dm, morbid obesity, copd with possible superimposed obesity hypoventilation syndrome, on chronic O 2 (2L during the day, 4 at night), osa on nightly cpcp, htn, dCHF, and CAD  Presenting with 4 day history of vulvar pain,  And limited for uncontrolled diabetes with mons pubis cellulitis and secondary sepsis.  She was given 2 L of normal saline and antibiotics initiated at the ED prior to her admission   Assessment & Plan:   Principal Problem:   Necrotizing soft tissue infection Active Problems:   Morbid (severe) obesity due to excess calories (HCC)   Tobacco abuse   COPD GOLD 0    Seizure disorder (HCC)   Bipolar disorder (HCC)   Sleep apnea   Essential hypertension   Diabetes (HCC)   IBS (irritable bowel syndrome)   Sepsis (HCC)   Elevated serum hCG   Open wound of genital labia   Vulvar abscess   Poorly controlled type 2 diabetes mellitus ([place])   DNR (do not resuscitate)   CKD (chronic kidney disease), stage III ([place])   1. Necrotizing soft tissue infection of Mons/ vulva- went to OR 7/15 - SP I&D labial/ vulvar area 01/22/18 by Gen Surgery - pcn allergic, patient is continued on  IV aztreonam / metronidazole/vancomycin - Patient was seen by palliative care 7/15, patient wants the following:             - DNR             - No trach             - Does not want to be re-intubated.              - No feeding tubes             - Not keen on dialysis; not a good long term candidateregardless given      co-morbidities and overall weakness - BP presently stable. On baseline 4LNC               2. Uncontrolled type 2 diabetes mellitus: - metformin currently on hold - doesn't appear to be on any long-acting insulin at home - currently on high dose BID  SSI starting at [facility] > 200; diabetes team is consulting and we will prob need to make changes because for wound healing need to keep BS < 180 -U500 was ordered, however AM dose not given as per Diabetic Coordinator. Due to timing, supper dosed insulin is anticipated   3. Acute on chronic kidney injury: better - Cr baseline is 1.4- 1.8 (CKD III). Cr now at baseline  - diuretics on hold (metolazone, torsemide and Aldactone)  - dc'd nsaids  - lowered IVF"s to 50 cc/hr to avoid vol overload  - stable at present  4. elevated HCG:  Reportedly has not had sexual intercourse for past 4-5 years  follow clinically -  Consider outpatient workup if persistent   5. COPD / OSA:    - sees pulm in office for COPD gold stage 0 w nocturnal hypoxemia (on noct O2 at 3L as of sept 2018 office visit)  - Supportive care, p.r.n. Albuterol, on symbicort at home  - no wheezing currently - Presently on baseline 4LNC   6.  Chronic diastolic CHF:   - admitted here  by cardiology once in 2017 and once late 2018 for dCHF exacerbation - diuretics on hold in view of sepsis - follow daily wts, - repeat bmet in  AM, labs reviewed  7.  DNR:  Seen by palliative care 7/15, see their notes, remains DNR and no re-intubation  DVT prophylaxis: Heparin subq Code Status: DNR Family Communication: Pt in room, family not at bedside Disposition Plan: Uncertain at this time  Consultants:   Palliative care  General Surgery    Procedures:  SP I&D labial/ vulvar area 01/22/18 by Gen Surgery     Antimicrobials: Anti-infectives (From admission, onward)   Start     Dose/Rate Route Frequency Ordered Stop   01/19/18 1800  vancomycin (VANCOCIN) 1,500 mg in sodium chloride 0.9 % 500 mL IVPB     1,500 mg 250 mL/hr over 120 Minutes Intravenous Every 24 hours 01/19/18 1639     01/19/18 1700  aztreonam (AZACTAM) 1 g in sodium chloride 0.9 % 100 mL IVPB     1 g 200 mL/hr over 30 Minutes Intravenous Every 8 hours 01/19/18  1639     01/19/18 1700  metroNIDAZOLE (FLAGYL) IVPB 500 mg     500 mg 100 mL/hr over 60 Minutes Intravenous Every 8 hours 01/19/18 1639         Subjective: Complained of wanting pain medication immediately upon awakening  Objective: Vitals:   01/24/18 0424 01/24/18 0500 01/24/18 0632 01/24/18 0758  BP: 109/62  139/68   Pulse: 82  85   Resp: 16     Temp:      TempSrc:      SpO2: 97%  96% 100%  Weight:  (!) 139.8 kg (308 lb 3.3 oz)    Height:        Intake/Output Summary (Last 24 hours) at 01/24/2018 1449 Last data filed at 01/24/2018 0646 Gross per 24 hour  Intake -  Output 1550 ml  Net -1550 ml   Filed Weights   01/22/18 1110 01/23/18 0501 01/24/18 0500  Weight: (!) 139.7 kg (307 lb 15.7 oz) (!) 140.6 kg (309 lb 15.5 oz) (!) 139.8 kg (308 lb 3.3 oz)    Examination: General exam: Appears calm and comfortable  Respiratory system: Clear to auscultation. Respiratory effort normal. Cardiovascular system: S1 & S2 heard, RRR Gastrointestinal system: Abdomen is nondistended, soft and nontender. No organomegaly or masses felt. Normal bowel sounds heard. Central nervous system: Alert and oriented. No focal neurological deficits. Extremities: Symmetric 5 x 5 power. Skin: No rashes, lesions  Psychiatry: Judgement and insight appear normal. Mood & affect appropriate.   Data Reviewed: I have personally reviewed following labs and imaging studies  CBC: Recent Labs  Lab 01/19/18 1526 01/20/18 0336 01/21/18 0317 01/22/18 0341 01/22/18 2026 01/23/18 0932 01/24/18 0430  WBC 24.6* 20.9* 20.8* 19.9*  --  23.1* 19.6*  NEUTROABS 21.4*  --   --   --   --   --   --   HGB 11.7* 11.1* 10.4* 10.2* 11.2* 10.6* 10.1*  HCT 36.3 34.2* 33.8* 33.1* 33.0* 34.9* 33.3*  MCV 83.8 83.6 87.3 86.4  --  87.5 87.4  PLT 186 168 167 183  --  227 546   Basic Metabolic Panel: Recent Labs  Lab 01/20/18 0336 01/21/18 0317 01/22/18 0341 01/22/18 2026 01/23/18 0932 01/24/18 0430  NA 127* 130*  131* 134* 136 140  K 3.6 4.1 4.0 4.0 4.5 4.7  CL 85* 95* 97*  --  103 108  CO2 28 23 22   --  22 24  GLUCOSE 516* 488* 381*  --  86 214*  BUN 56* 55* 56*  --  49* 41*  CREATININE 1.77* 1.72* 1.71*  --  1.36* 1.23*  CALCIUM 8.6* 8.3* 8.2*  --  8.6* 9.2   GFR: Estimated Creatinine Clearance: 77.5 mL/min (A) (by C-G formula based on SCr of 1.23 mg/dL (H)). Liver Function Tests: Recent Labs  Lab 01/19/18 1526 01/21/18 0317 01/22/18 0341  AST 28 22 25   ALT 25 27 28   ALKPHOS 118 138* 167*  BILITOT 1.3* 1.2 1.1  PROT 8.0 7.5 7.2  ALBUMIN 3.2* 2.7* 2.4*   No results for input(s): LIPASE, AMYLASE in the last 168 hours. No results for input(s): AMMONIA in the last 168 hours. Coagulation Profile: No results for input(s): INR, PROTIME in the last 168 hours. Cardiac Enzymes: No results for input(s): CKTOTAL, CKMB, CKMBINDEX, TROPONINI in the last 168 hours. BNP (last 3 results) No results for input(s): PROBNP in the last 8760 hours. HbA1C: No results for input(s): HGBA1C in the last 72 hours. CBG: Recent Labs  Lab 01/23/18 1202 01/23/18 1728 01/23/18 2106 01/24/18 0748 01/24/18 1157  GLUCAP 95 152* 197* 219* 281*   Lipid Profile: No results for input(s): CHOL, HDL, LDLCALC, TRIG, CHOLHDL, LDLDIRECT in the last 72 hours. Thyroid Function Tests: No results for input(s): TSH, T4TOTAL, FREET4, T3FREE, THYROIDAB in the last 72 hours. Anemia Panel: No results for input(s): VITAMINB12, FOLATE, FERRITIN, TIBC, IRON, RETICCTPCT in the last 72 hours. Sepsis Labs: Recent Labs  Lab 01/19/18 1535 01/19/18 1718 01/19/18 2042 01/20/18 0336  LATICACIDVEN 3.54* 3.06* 1.50 1.8    Recent Results (from the past 240 hour(s))  Blood Culture (routine x 2)     Status: None   Collection Time: 01/19/18  4:26 PM  Result Value Ref Range Status   Specimen Description BLOOD SITE NOT SPECIFIED  Final   Special Requests   Final    BOTTLES DRAWN AEROBIC AND ANAEROBIC Blood Culture adequate volume     Culture   Final    NO GROWTH 5 DAYS Performed at [facility], [address], [place], [place] [address]    Report Status 01/24/2018 FINAL  Final  Blood Culture (routine x 2)     Status: None   Collection Time: 01/19/18  4:31 PM  Result Value Ref Range Status   Specimen Description BLOOD BLOOD RIGHT FOREARM  Final   Special Requests   Final    BOTTLES DRAWN AEROBIC AND ANAEROBIC Blood Culture adequate volume   Culture   Final    NO GROWTH 5 DAYS Performed at [facility], [place] [address], [place], [place] [address]    Report Status 01/24/2018 FINAL  Final  Surgical pcr screen     Status: None   Collection Time: 01/22/18  1:27 PM  Result Value Ref Range Status   MRSA, PCR NEGATIVE NEGATIVE Final   Staphylococcus aureus NEGATIVE NEGATIVE Final    Comment: (NOTE) The Xpert SA Assay (FDA approved for NASAL specimens in patients 32 years of age and older), is one component of a comprehensive surveillance program. It is not intended to diagnose infection nor to guide or monitor treatment. Performed at [facility], [place] [address], [place], [place] [address]   Aerobic/Anaerobic Culture (surgical/deep wound)     Status: None (Preliminary result)   Collection Time: 01/22/18  6:49 PM  Result Value Ref Range Status   Specimen Description ABSCESS LABIA  Final   Special  Requests PATIENT ON FOLLOWING VANC FLAGYL AZTREONAM  Final   Gram Stain   Final    MODERATE WBC PRESENT, PREDOMINANTLY PMN ABUNDANT GRAM NEGATIVE RODS ABUNDANT GRAM POSITIVE COCCI Performed at [facility], [place] [address] [place], [place] [address]    Culture   Final    RARE UNIDENTIFIED ORGANISM NO ANAEROBES ISOLATED; CULTURE IN PROGRESS FOR 5 DAYS    Report Status PENDING  Incomplete  Culture, Urine     Status: Abnormal (Preliminary result)   Collection Time: 01/22/18  8:21 PM  Result Value Ref Range Status   Specimen Description URINE, CLEAN CATCH  Final   Special Requests   Final     NONE Performed at [facility], [place] [address], [place], [place] [address]    Culture >=100,000 COLONIES/mL ENTEROCOCCUS CASSELIFLAVUS (A)  Final   Report Status PENDING  Incomplete     Radiology Studies: No results found.  Scheduled Meds: . acetaminophen  650 mg Oral Q4H  . busPIRone  30 mg Oral BID  . famotidine  20 mg Oral Daily  . FLUoxetine  40 mg Oral Daily  . heparin  5,000 Units Subcutaneous Q8H  .  HYDROmorphone (DILAUDID) injection  1 mg Intravenous Once  . insulin regular human CONCENTRATED  170-370 Units Subcutaneous BID WC  . linaclotide  290 mcg Oral QAC breakfast  . mometasone-formoterol  2 puff Inhalation BID  . pregabalin  300 mg Oral BID  . traZODone  150 mg Oral QHS   Continuous Infusions: . sodium chloride 50 mL/hr at 01/23/18 0053  . aztreonam 1 g (01/24/18 0900)  . metronidazole 500 mg (01/24/18 0847)  . vancomycin 1,500 mg (01/23/18 1833)     LOS: 5 days   Marylu Lund, MD [facility] Pager [phone]  If 7PM-7AM, please contact night-coverage [email or web address] Password [facility] 01/24/2018, 2:49 PM

## 2018-01-24 NOTE — Progress Notes (Signed)
Palliative:  I met again today with [name]. She is alert, on oxygen, and in good spirits. She is happy that she does not have to return to OR today. Also happy to have food but has not eaten much. Encouraged her to eat well so she has good nutrients to help with healing. Tried to prepare her for hydrotherapy to begin tomorrow. Emotional support provided.   15 min  Vinie Sill, NP Palliative Medicine Team Pager # [phone] (M-F 8a-5p) Team Phone # [phone] (Nights/Weekends)

## 2018-01-24 NOTE — Progress Notes (Signed)
Patient ID: [name], female   DOB: [birth year], 47 y.o.   MRN: [id number]    2 Days Post-Op  Subjective: Pt complains of some burning today after being cleaned up.  Otherwise, no new complaints.   Objective: Vital signs in last 24 hours: Temp:  [98.1 F (36.7 C)-98.9 F (37.2 C)] 98.1 F (36.7 C) (07/16 2000) Pulse Rate:  [77-98] 85 (07/17 0632) Resp:  [9-26] 16 (07/17 0424) BP: (101-139)/(51-86) 139/68 (07/17 0632) SpO2:  [96 %-100 %] 100 % (07/17 0758) FiO2 (%):  [30 %-40 %] 30 % (07/17 0047) Weight:  [139.8 kg (308 lb 3.3 oz)] 139.8 kg (308 lb 3.3 oz) (07/17 0500) Last BM Date: 01/22/18  Intake/Output from previous day: 07/16 0701 - 07/17 0700 In: 200 [P.O.:200] Out: 1850 [Urine:1850] Intake/Output this shift: No intake/output data recorded.  PE: GU: vulvar wound has some slight dishwater drainage in the deepest part of the wound that was not packed well.  Tissue is less beefy red today with sporadic areas of pale adipose tissue and some small areas of thin fibrin/necrotic tissue.  No tissue could be finger dissected in any direction.  Erythema of mons and labia is improved.  Wound repacked  Lab Results:  Recent Labs    01/23/18 0932 01/24/18 0430  WBC 23.1* 19.6*  HGB 10.6* 10.1*  HCT 34.9* 33.3*  PLT 227 243   BMET Recent Labs    01/23/18 0932 01/24/18 0430  NA 136 140  K 4.5 4.7  CL 103 108  CO2 22 24  GLUCOSE 86 214*  BUN 49* 41*  CREATININE 1.36* 1.23*  CALCIUM 8.6* 9.2   PT/INR No results for input(s): LABPROT, INR in the last 72 hours. CMP     Component Value Date/Time   NA 140 01/24/2018 0430   NA 137 01/08/2018 1117   K 4.7 01/24/2018 0430   CL 108 01/24/2018 0430   CO2 24 01/24/2018 0430   GLUCOSE 214 (H) 01/24/2018 0430   BUN 41 (H) 01/24/2018 0430   BUN 31 (H) 01/08/2018 1117   CREATININE 1.23 (H) 01/24/2018 0430   CREATININE 1.20 (H) 05/24/2016 0938   CALCIUM 9.2 01/24/2018 0430   PROT 7.2 01/22/2018 0341   PROT 7.8  01/08/2018 1117   ALBUMIN 2.4 (L) 01/22/2018 0341   ALBUMIN 4.1 01/08/2018 1117   AST 25 01/22/2018 0341   ALT 28 01/22/2018 0341   ALKPHOS 167 (H) 01/22/2018 0341   BILITOT 1.1 01/22/2018 0341   BILITOT 0.5 01/08/2018 1117   GFRNONAA 51 (L) 01/24/2018 0430   GFRAA 59 (L) 01/24/2018 0430   Lipase     Component Value Date/Time   LIPASE 82 (H) 07/19/2017 1942       Studies/Results: No results found.  Anti-infectives: Anti-infectives (From admission, onward)   Start     Dose/Rate Route Frequency Ordered Stop   01/19/18 1800  vancomycin (VANCOCIN) 1,500 mg in sodium chloride 0.9 % 500 mL IVPB     1,500 mg 250 mL/hr over 120 Minutes Intravenous Every 24 hours 01/19/18 1639     01/19/18 1700  aztreonam (AZACTAM) 1 g in sodium chloride 0.9 % 100 mL IVPB     1 g 200 mL/hr over 30 Minutes Intravenous Every 8 hours 01/19/18 1639     01/19/18 1700  metroNIDAZOLE (FLAGYL) IVPB 500 mg     500 mg 100 mL/hr over 60 Minutes Intravenous Every 8 hours 01/19/18 1639         Assessment/Plan Necrotizing soft tissue  infection of Mons/vulva -POD 2, s/p I&D of vulvar infection.  Wound is about 5x8x8cm in size. -decrease dilaudid to 0.5mg  q3 hrs prn to decrease sedation and breathing issues.  On just nasal cannula currently -add tylenol scheduled as well as oxycodone -BID dressing changes -resume diet.  Wound isn't quite as clean today, but no needs for return to OR at this time.  Will add PT hydrotherapy to help clean wound up some over the next several days.  She may, when stable, benefit TX to 6N for better wound care/packing.  CAD CHF RBBB and LAFB COPD, on 4L O2 Hypoventilation syndrome Asthma  Uncontrolled DM Tobacco abuse HTN Seizure disorder CVA, history of at age 39 History of DVTs  FEN -carb mod diet VTE -Heparin 5000 units q8h ID -Azactam 7/12 -->Flagyl 7/12 -->Vancomycin 7/12-->   LOS: 5 days    Henreitta Cea , [facility]  Surgery 01/24/2018, 11:20 AM Pager: [phone]

## 2018-01-25 LAB — BASIC METABOLIC PANEL
Anion gap: 9 (ref 5–15)
BUN: 39 mg/dL — ABNORMAL HIGH (ref 6–20)
CO2: 27 mmol/L (ref 22–32)
Calcium: 8.7 mg/dL — ABNORMAL LOW (ref 8.9–10.3)
Chloride: 104 mmol/L (ref 98–111)
Creatinine, Ser: 1.06 mg/dL — ABNORMAL HIGH (ref 0.44–1.00)
GFR calc Af Amer: >60 mL/min (ref >60)
GFR calc non Af Amer: >60 mL/min (ref >60)
Glucose, Bld: 193 mg/dL — ABNORMAL HIGH (ref 70–99)
Potassium: 4.6 mmol/L (ref 3.5–5.1)
Sodium: 140 mmol/L (ref 135–145)

## 2018-01-25 LAB — GLUCOSE, CAPILLARY
Glucose-Capillary: 160 mg/dL — ABNORMAL HIGH (ref 70–99)
Glucose-Capillary: 154 mg/dL — ABNORMAL HIGH (ref 70–99)
Glucose-Capillary: 117 mg/dL — ABNORMAL HIGH (ref 70–99)
Glucose-Capillary: 123 mg/dL — ABNORMAL HIGH (ref 70–99)

## 2018-01-25 LAB — URINE CULTURE

## 2018-01-25 MED ORDER — LINEZOLID 600 MG/300ML IV SOLN
600.0000 mg | Freq: Two times a day (BID) | INTRAVENOUS | Status: DC
  Administered 2018-01-25 – 2018-02-02 (×18): 600 mg via INTRAVENOUS
  Filled 2018-01-25 (×19): qty 300

## 2018-01-25 NOTE — Care Management Note (Addendum)
Case Management Note  Patient Details  Name: [name] MRN: [id number] Date of Birth: 1971-04-06  Subjective/Objective:         Presents with vulvar pain,  Hx of dm, morbid obesity, copd / obesity hypoventilation syndrome, chronic o2 (2L during the day, 4 at night), osa on nightly cpcp, htn, dCHF, CAD, bipolar disorder, and tobacco abuse. Resides with husband [place]. DME: CPAP, oxygen/[employer], wheelchair, walker. PTA independent with ADL's per husband.      -  s/p I&D of vulvar infection 7/15       Hydrotherapy began 7/18, L labia   Cleotha Whalin (Spouse)     [phone]      PCP: Belenda Cruise   Action/Plan:   Debridement, and washout scheduled for 7/19...    Transition back to home when medically stable... NCM following for disposition needs  Expected Discharge Date:                  Expected Discharge Plan:     In-House Referral:     Discharge planning Services     Post Acute Care Choice:    Choice offered to:     DME Arranged:    DME Agency:     HH Arranged:    HH Agency:     Status of Service:  In process, will continue to follow  If discussed at Long Length of Stay Meetings, dates discussed:    Additional Comments:  Sharin Mons, RN 01/25/2018, 6:34 PM

## 2018-01-25 NOTE — Progress Notes (Signed)
PROGRESS NOTE    [name]  EPP:[id number] DOB: 1970/09/02 DOA: 01/19/2018 PCP: Pleas Koch, NP    Brief Narrative:  47 y.o.femalewith medical history significant for but not limited to poorly controlled resistant t2dm, morbid obesity, copd with possible superimposed obesity hypoventilation syndrome, on chronic O 2 (2L during the day, 4 at night), osa on nightly cpcp, htn, dCHF, and CAD  Presenting with 4 day history of vulvar pain,  And limited for uncontrolled diabetes with mons pubis cellulitis and secondary sepsis.  She was given 2 L of normal saline and antibiotics initiated at the ED prior to her admission   Assessment & Plan:   Principal Problem:   Necrotizing soft tissue infection Active Problems:   Morbid (severe) obesity due to excess calories (HCC)   Tobacco abuse   COPD GOLD 0    Seizure disorder (HCC)   Bipolar disorder (HCC)   Sleep apnea   Essential hypertension   Diabetes (HCC)   IBS (irritable bowel syndrome)   Sepsis (HCC)   Elevated serum hCG   Open wound of genital labia   Vulvar abscess   Poorly controlled type 2 diabetes mellitus ([place])   DNR (do not resuscitate)   CKD (chronic kidney disease), stage III ([place])   1. Necrotizing soft tissue infection of Mons/ vulva- went to OR 7/15 - SP I&D labial/ vulvar area 01/22/18 by Gen Surgery - pcn allergic, Patient had been continued on  IV aztreonam / metronidazole/vancomycin - Patient was seen by palliative care 7/15, patient wants the following:             - DNR             - No trach             - Does not want to be re-intubated.              - No feeding tubes             - Not keen on dialysis; not a good long term candidateregardless given      co-morbidities and overall weakness - On baseline O2 at present - Wound culture growing VRE -Discussed with ID. D/c vanc and start linezolid. Will stop SSRI. -Discussed with General surgery. Wound appears more purulent, may require additional  debridement   2. Uncontrolled type 2 diabetes mellitus: - metformin currently on hold - doesn't appear to be on any long-acting insulin at home - currently on high dose BID SSI starting at [facility] > 200; diabetes team is consulting and we will prob need to make changes because for wound healing need to keep BS < 180 -U500 has been started. Glucose trends reviewed. Better today   3. Acute on chronic kidney injury: better - Cr baseline is 1.4- 1.8 (CKD III). Cr now at baseline  - diuretics on hold (metolazone, torsemide and Aldactone)  - dc'd nsaids  - lowered IVF"s to 50 cc/hr to avoid vol overload  - Remains stable at present  4. elevated HCG:  Reportedly has not had sexual intercourse for past 4-5 years  follow clinically -  Recommend close outpatient workup if persistent   5. COPD / OSA:    - sees pulm in office for COPD gold stage 0 w nocturnal hypoxemia (on noct O2 at 3L as of sept 2018 office visit)  - Supportive care, p.r.n. Albuterol, on symbicort at home  - no wheezing currently - Patient is continued on baseline O2   6.  Chronic diastolic CHF:   - admitted here by cardiology once in 2017 and once late 2018 for dCHF exacerbation - diuretics on hold in view of sepsis - follow daily wts, - will recheck bmet in  AM, labs reviewed  7.  DNR:  Seen by palliative care 7/15, see their notes, remains DNR and no re-intubation  DVT prophylaxis: Heparin subq Code Status: DNR Family Communication: Pt in room, family not at bedside Disposition Plan: Uncertain at this time  Consultants:   Palliative care  General Surgery    Procedures:  SP I&D labial/ vulvar area 01/22/18 by Gen Surgery     Antimicrobials: Anti-infectives (From admission, onward)   Start     Dose/Rate Route Frequency Ordered Stop   01/25/18 1530  linezolid (ZYVOX) IVPB 600 mg    Note to Pharmacy:  Have stopped fluoxetine   600 mg 300 mL/hr over 60 Minutes Intravenous Every 12 hours 01/25/18 1437      01/19/18 1800  vancomycin (VANCOCIN) 1,500 mg in sodium chloride 0.9 % 500 mL IVPB  Status:  Discontinued     1,500 mg 250 mL/hr over 120 Minutes Intravenous Every 24 hours 01/19/18 1639 01/25/18 1437   01/19/18 1700  aztreonam (AZACTAM) 1 g in sodium chloride 0.9 % 100 mL IVPB     1 g 200 mL/hr over 30 Minutes Intravenous Every 8 hours 01/19/18 1639     01/19/18 1700  metroNIDAZOLE (FLAGYL) IVPB 500 mg     500 mg 100 mL/hr over 60 Minutes Intravenous Every 8 hours 01/19/18 1639        Subjective: Without complaints this AM  Objective: Vitals:   01/25/18 0825 01/25/18 1035 01/25/18 1217 01/25/18 1237  BP:    (!) 141/75  Pulse:  84  84  Resp:  14  13  Temp:   98.7 F (37.1 C) 98.7 F (37.1 C)  TempSrc:   Oral Oral  SpO2: 98% 97%  100%  Weight:      Height:        Intake/Output Summary (Last 24 hours) at 01/25/2018 1439 Last data filed at 01/25/2018 1021 Gross per 24 hour  Intake 3478.83 ml  Output 850 ml  Net 2628.83 ml   Filed Weights   01/23/18 0501 01/24/18 0500 01/25/18 0500  Weight: (!) 140.6 kg (309 lb 15.5 oz) (!) 139.8 kg (308 lb 3.3 oz) (!) 140.9 kg (310 lb 10.1 oz)    Examination: General exam: Conversant, in no acute distress Respiratory system: normal chest rise, clear, no audible wheezing Cardiovascular system: regular rhythm, s1-s2 Gastrointestinal system: Nondistended, nontender, pos BS Central nervous system: No seizures, no tremors Extremities: No cyanosis, no joint deformities Skin: No rashes, no pallor Psychiatry: Affect normal // no auditory hallucinations   Data Reviewed: I have personally reviewed following labs and imaging studies  CBC: Recent Labs  Lab 01/19/18 1526 01/20/18 0336 01/21/18 0317 01/22/18 0341 01/22/18 2026 01/23/18 0932 01/24/18 0430  WBC 24.6* 20.9* 20.8* 19.9*  --  23.1* 19.6*  NEUTROABS 21.4*  --   --   --   --   --   --   HGB 11.7* 11.1* 10.4* 10.2* 11.2* 10.6* 10.1*  HCT 36.3 34.2* 33.8* 33.1* 33.0* 34.9*  33.3*  MCV 83.8 83.6 87.3 86.4  --  87.5 87.4  PLT 186 168 167 183  --  227 109   Basic Metabolic Panel: Recent Labs  Lab 01/21/18 0317 01/22/18 0341 01/22/18 2026 01/23/18 0932 01/24/18 0430 01/25/18 0428  NA  130* 131* 134* 136 140 140  K 4.1 4.0 4.0 4.5 4.7 4.6  CL 95* 97*  --  103 108 104  CO2 23 22  --  22 24 27   GLUCOSE 488* 381*  --  86 214* 193*  BUN 55* 56*  --  49* 41* 39*  CREATININE 1.72* 1.71*  --  1.36* 1.23* 1.06*  CALCIUM 8.3* 8.2*  --  8.6* 9.2 8.7*   GFR: Estimated Creatinine Clearance: 90.4 mL/min (A) (by C-G formula based on SCr of 1.06 mg/dL (H)). Liver Function Tests: Recent Labs  Lab 01/19/18 1526 01/21/18 0317 01/22/18 0341  AST 28 22 25   ALT 25 27 28   ALKPHOS 118 138* 167*  BILITOT 1.3* 1.2 1.1  PROT 8.0 7.5 7.2  ALBUMIN 3.2* 2.7* 2.4*   No results for input(s): LIPASE, AMYLASE in the last 168 hours. No results for input(s): AMMONIA in the last 168 hours. Coagulation Profile: No results for input(s): INR, PROTIME in the last 168 hours. Cardiac Enzymes: No results for input(s): CKTOTAL, CKMB, CKMBINDEX, TROPONINI in the last 168 hours. BNP (last 3 results) No results for input(s): PROBNP in the last 8760 hours. HbA1C: No results for input(s): HGBA1C in the last 72 hours. CBG: Recent Labs  Lab 01/24/18 1157 01/24/18 1710 01/24/18 2306 01/25/18 0815 01/25/18 1216  GLUCAP 281* 312* 266* 160* 154*   Lipid Profile: No results for input(s): CHOL, HDL, LDLCALC, TRIG, CHOLHDL, LDLDIRECT in the last 72 hours. Thyroid Function Tests: No results for input(s): TSH, T4TOTAL, FREET4, T3FREE, THYROIDAB in the last 72 hours. Anemia Panel: No results for input(s): VITAMINB12, FOLATE, FERRITIN, TIBC, IRON, RETICCTPCT in the last 72 hours. Sepsis Labs: Recent Labs  Lab 01/19/18 1535 01/19/18 1718 01/19/18 2042 01/20/18 0336  LATICACIDVEN 3.54* 3.06* 1.50 1.8    Recent Results (from the past 240 hour(s))  Blood Culture (routine x 2)      Status: None   Collection Time: 01/19/18  4:26 PM  Result Value Ref Range Status   Specimen Description BLOOD SITE NOT SPECIFIED  Final   Special Requests   Final    BOTTLES DRAWN AEROBIC AND ANAEROBIC Blood Culture adequate volume   Culture   Final    NO GROWTH 5 DAYS Performed at [facility], [address], [place], [place] [address]    Report Status 01/24/2018 FINAL  Final  Blood Culture (routine x 2)     Status: None   Collection Time: 01/19/18  4:31 PM  Result Value Ref Range Status   Specimen Description BLOOD BLOOD RIGHT FOREARM  Final   Special Requests   Final    BOTTLES DRAWN AEROBIC AND ANAEROBIC Blood Culture adequate volume   Culture   Final    NO GROWTH 5 DAYS Performed at [facility], [place] [address], [place], [place] [address]    Report Status 01/24/2018 FINAL  Final  Surgical pcr screen     Status: None   Collection Time: 01/22/18  1:27 PM  Result Value Ref Range Status   MRSA, PCR NEGATIVE NEGATIVE Final   Staphylococcus aureus NEGATIVE NEGATIVE Final    Comment: (NOTE) The Xpert SA Assay (FDA approved for NASAL specimens in patients 46 years of age and older), is one component of a comprehensive surveillance program. It is not intended to diagnose infection nor to guide or monitor treatment. Performed at [facility], [place] [address], [place], [place] [address]   Aerobic/Anaerobic Culture (surgical/deep wound)  Status: None (Preliminary result)   Collection Time: 01/22/18  6:49 PM  Result Value Ref Range Status   Specimen Description ABSCESS LABIA  Final   Special Requests PATIENT ON FOLLOWING VANC FLAGYL AZTREONAM  Final   Gram Stain   Final    MODERATE WBC PRESENT, PREDOMINANTLY PMN ABUNDANT GRAM NEGATIVE RODS ABUNDANT GRAM POSITIVE COCCI    Culture   Final    RARE ENTEROCOCCUS CASSELIFLAVUS NO ANAEROBES ISOLATED; CULTURE IN PROGRESS FOR 5 DAYS    Report Status PENDING  Incomplete   Organism ID, Bacteria ENTEROCOCCUS  CASSELIFLAVUS  Final      Susceptibility   Enterococcus casseliflavus - MIC*    AMPICILLIN <=2 SENSITIVE Sensitive     VANCOMYCIN RESISTANT Resistant     GENTAMICIN SYNERGY SENSITIVE Sensitive     LINEZOLID Value in next row Sensitive      2 SENSITIVEPerformed at [facility], [address], [place], [place] [address]    * RARE ENTEROCOCCUS CASSELIFLAVUS  Culture, Urine     Status: Abnormal   Collection Time: 01/22/18  8:21 PM  Result Value Ref Range Status   Specimen Description URINE, CLEAN CATCH  Final   Special Requests   Final    NONE Performed at [facility], [place] [address] [place], [place] [address]    Culture MULTIPLE SPECIES PRESENT, SUGGEST RECOLLECTION (A)  Final   Report Status 01/25/2018 FINAL  Final     Radiology Studies: No results found.  Scheduled Meds: . acetaminophen  650 mg Oral Q4H  . busPIRone  30 mg Oral BID  . famotidine  20 mg Oral Daily  . heparin  5,000 Units Subcutaneous Q8H  .  HYDROmorphone (DILAUDID) injection  1 mg Intravenous Once  . insulin regular human CONCENTRATED  170-370 Units Subcutaneous BID WC  . linaclotide  290 mcg Oral QAC breakfast  . mometasone-formoterol  2 puff Inhalation BID  . pregabalin  300 mg Oral BID  . traZODone  150 mg Oral QHS   Continuous Infusions: . sodium chloride Stopped (01/23/18 0054)  . aztreonam 1 g (01/25/18 1021)  . linezolid (ZYVOX) IV    . metronidazole 500 mg (01/25/18 1134)     LOS: 6 days   Marylu Lund, MD [facility] Pager [phone]  If 7PM-7AM, please contact night-coverage [email or web address] Password [facility] 01/25/2018, 2:39 PM

## 2018-01-25 NOTE — Progress Notes (Signed)
Spoke to [employer].  Patient doing well.  RN knows to page [place] when hydrotherapy comes so we can evaluate her wound at that time.  Henreitta Cea 8:29 AM 01/25/2018

## 2018-01-25 NOTE — Progress Notes (Signed)
Physical Therapy Wound Treatment Patient Details  Name: [name] MRN: [id number] Date of Birth: 07/31/1970  Today's Date: 01/25/2018 Time: 1024-1128 Time Calculation (min): 64 min  Subjective  Subjective: Pt appears somewhat sedated at times, slurring words, and at other times yelling out throughout treatment.  Patient and Family Stated Goals: Decrease pain Prior Treatments: I&D in OR 7/15  Pain Score: Pain Score: 0-No pain  Wound Assessment  Wound / Incision (Open or Dehisced) 01/21/18 Non-pressure wound Labia Left open area due to cellulitis (Active)  Wound Image   01/25/2018  2:48 PM  Dressing Type ABD;Barrier Film (skin prep);Gauze (Comment);Moist to dry 01/25/2018  2:48 PM  Dressing Changed Changed 01/25/2018  2:48 PM  Dressing Status Clean;Dry;Intact 01/25/2018  2:48 PM  Dressing Change Frequency Daily 01/25/2018  2:48 PM  Site / Wound Assessment Red;Yellow;Black 01/25/2018  2:48 PM  % Wound base Red or Granulating 35% 01/25/2018  2:48 PM  % Wound base Yellow/Fibrinous Exudate 50% 01/25/2018  2:48 PM  % Wound base Black/Eschar 15% 01/25/2018  2:48 PM  Peri-wound Assessment Intact;Denuded 01/25/2018  2:48 PM  Wound Length (cm) 9.2 cm 01/25/2018  2:48 PM  Wound Width (cm) 5.8 cm 01/25/2018  2:48 PM  Wound Depth (cm) 4.2 cm 01/25/2018  2:48 PM  Wound Volume (cm^3) 224.11 cm^3 01/25/2018  2:48 PM  Wound Surface Area (cm^2) 53.36 cm^2 01/25/2018  2:48 PM  Tunneling (cm) 4.7 cm at 2:00; 9.0 cm at 8:00 01/25/2018  2:48 PM  Margins Unattached edges (unapproximated) 01/25/2018  2:48 PM  Closure None 01/25/2018  2:48 PM  Drainage Amount Copious 01/25/2018  2:48 PM  Drainage Description Purulent;Odor 01/25/2018  2:48 PM  Treatment Debridement (Selective);Hydrotherapy (Pulse lavage);Packing (Saline gauze) 01/25/2018  2:48 PM   Hydrotherapy Pulsed lavage therapy - wound location: L labia/vulva Pulsed Lavage with Suction (psi): 8 psi Pulsed Lavage with Suction - Normal Saline Used: 1000 mL Pulsed  Lavage Tip: Tip with splash shield Selective Debridement Selective Debridement - Location: L labia/vulva Selective Debridement - Tools Used: Forceps;Scissors Selective Debridement - Tissue Removed: Yellow and brown unviable tissue   Wound Assessment and Plan  Wound Therapy - Assess/Plan/Recommendations Wound Therapy - Clinical Statement: Pt presents to hydrotherapy s/p surgical I&D on 7/15. Noted copious amounts of purulent drainage dripping from tunnels. Debridement was limited due to pt's decreased tolerance. Will benefit from continued hydrotherapy to decrease bioburden and promote wound bed healing.  Wound Therapy - Functional Problem List: Decreased tolerance for position changes and OOB mobility Factors Delaying/Impairing Wound Healing: Diabetes Mellitus;Immobility;Multiple medical problems Hydrotherapy Plan: Debridement;Dressing change;Patient/family education;Pulsatile lavage with suction Wound Therapy - Frequency: 6X / week Wound Therapy - Follow Up Recommendations: Skilled nursing facility Wound Plan: See above  Wound Therapy Goals- Improve the function of patient's integumentary system by progressing the wound(s) through the phases of wound healing (inflammation - proliferation - remodeling) by: Decrease Necrotic Tissue to: 20% Decrease Necrotic Tissue - Progress: Goal set today Increase Granulation Tissue to: 80% Increase Granulation Tissue - Progress: Goal set today Time For Goal Achievement: 7 days Wound Therapy - Potential for Goals: Good  Goals will be updated until maximal potential achieved or discharge criteria met.  Discharge criteria: when goals achieved, discharge from hospital, MD decision/surgical intervention, no progress towards goals, refusal/missing three consecutive treatments without notification or medical reason.  GP      D  01/25/2018, 3:09 PM   , PT, DPT Acute Rehabilitation Services Pager: [phone]   

## 2018-01-25 NOTE — Progress Notes (Signed)
Patient Refusing CPAP at this time. She is Adamant that she will not be wearing that tonight. RN aware and will call if anything changes.

## 2018-01-25 NOTE — H&P (View-Only) (Signed)
Patient ID: [name], female   DOB: 11-17-1970, 47 y.o.   MRN: [id number]    3 Days Post-Op  Subjective: Pt awake, but sleepy secondary to pain meds prior to hydrotherapy.  Still with pain.  Objective: Vital signs in last 24 hours: Temp:  [98.2 F (36.8 C)] 98.2 F (36.8 C) (07/18 0816) Pulse Rate:  [80-85] 84 (07/18 1035) Resp:  [8-15] 14 (07/18 1035) BP: (118-140)/(54-77) 140/77 (07/18 0741) SpO2:  [97 %-100 %] 97 % (07/18 1035) Weight:  [140.9 kg (310 lb 10.1 oz)] 140.9 kg (310 lb 10.1 oz) (07/18 0500) Last BM Date: 01/22/18  Intake/Output from previous day: 07/17 0701 - 07/18 0700 In: -  Out: 600 [Urine:600] Intake/Output this shift: Total I/O In: 3238.8 [I.V.:61.9; IV Piggyback:3176.9] Out: 250 [Urine:250]  PE: Skin/gu: mons still with erythema and induration.  Just medial to wound above labial is very induration and hard.  Wound itself is less clean today.  Some purulent drainage noted coming from superior left part of wound along a tunnel.  More superficial necrotic adipose tissue and fibrin noted today.  Left labial has a track that extends now the entire length of my pointer finger, at least 6cm which is new.  This track has tan purulent drainage which is new as well.  The tissue of the labial is necrotic.  Difficult to fully assess the entirety of the wound secondary to body habitus and difficulty positioning patient.  Lab Results:  Recent Labs    01/23/18 0932 01/24/18 0430  WBC 23.1* 19.6*  HGB 10.6* 10.1*  HCT 34.9* 33.3*  PLT 227 243   BMET Recent Labs    01/24/18 0430 01/25/18 0428  NA 140 140  K 4.7 4.6  CL 108 104  CO2 24 27  GLUCOSE 214* 193*  BUN 41* 39*  CREATININE 1.23* 1.06*  CALCIUM 9.2 8.7*   PT/INR No results for input(s): LABPROT, INR in the last 72 hours. CMP     Component Value Date/Time   NA 140 01/25/2018 0428   NA 137 01/08/2018 1117   K 4.6 01/25/2018 0428   CL 104 01/25/2018 0428   CO2 27 01/25/2018 0428   GLUCOSE  193 (H) 01/25/2018 0428   BUN 39 (H) 01/25/2018 0428   BUN 31 (H) 01/08/2018 1117   CREATININE 1.06 (H) 01/25/2018 0428   CREATININE 1.20 (H) 05/24/2016 0938   CALCIUM 8.7 (L) 01/25/2018 0428   PROT 7.2 01/22/2018 0341   PROT 7.8 01/08/2018 1117   ALBUMIN 2.4 (L) 01/22/2018 0341   ALBUMIN 4.1 01/08/2018 1117   AST 25 01/22/2018 0341   ALT 28 01/22/2018 0341   ALKPHOS 167 (H) 01/22/2018 0341   BILITOT 1.1 01/22/2018 0341   BILITOT 0.5 01/08/2018 1117   GFRNONAA >60 01/25/2018 0428   GFRAA >60 01/25/2018 0428   Lipase     Component Value Date/Time   LIPASE 82 (H) 07/19/2017 1942       Studies/Results: No results found.  Anti-infectives: Anti-infectives (From admission, onward)   Start     Dose/Rate Route Frequency Ordered Stop   01/19/18 1800  vancomycin (VANCOCIN) 1,500 mg in sodium chloride 0.9 % 500 mL IVPB     1,500 mg 250 mL/hr over 120 Minutes Intravenous Every 24 hours 01/19/18 1639     01/19/18 1700  aztreonam (AZACTAM) 1 g in sodium chloride 0.9 % 100 mL IVPB     1 g 200 mL/hr over 30 Minutes Intravenous Every 8 hours 01/19/18 1639  01/19/18 1700  metroNIDAZOLE (FLAGYL) IVPB 500 mg     500 mg 100 mL/hr over 60 Minutes Intravenous Every 8 hours 01/19/18 1639         Assessment/Plan Necrotizing soft tissue infection of Mons/vulva -POD 3, s/p I&D of vulvar infection. Wound is about 5x8x8cm in size. -decrease dilaudid to 0.5mg  q3 hrs prn to decrease sedation and breathing issues. On just nasal cannula currently -add tylenol scheduled as well as oxycodone -BID dressing changes -wound looks worse today with new necrotic tissue as well as thick purulent drainage that is new.  Likely needs to return to OR tomorrow for further evaluation, possible debridement, and washout.  Discussed with patient although sleepy.  Will D/W MD and then call husband if needed.  CAD CHF RBBB and LAFB COPD, on 4L O2 Hypoventilation syndrome Asthma  Uncontrolled DM Tobacco  abuse HTN Seizure disorder CVA, history of at age 25 History of DVTs  FEN -carb mod diet, likely NPO p MN VTE -Heparin 5000 units q8h ID -Azactam 7/12 -->Flagyl 7/12 -->Vancomycin 7/12-->     LOS: 6 days    Henreitta Cea , [facility] 01/25/2018, 11:05 AM Pager: [phone]

## 2018-01-25 NOTE — Progress Notes (Signed)
Palliative:  Loma is much more lethargic today and delayed responses. Noted that she is refusing CPAP. We discussed the need for return to OR tomorrow. I discussed with her the importance of wearing BiPAP/CPAP to optimize her for surgery tomorrow. She tells me that she will try wearing this. Discussed the hopes that this will help her wound to heal. Emotional support provided.   Exam: Lethargic but arousable, delayed responses, no distress.   15 min  Vinie Sill, NP Palliative Medicine Team Pager # [phone] (M-F 8a-5p) Team Phone # [phone] (Nights/Weekends)

## 2018-01-25 NOTE — Progress Notes (Signed)
Patient refuses CPAP. Patient and significant other educated on importance of wearing CPAP. Patient said there isn't any way she will wear CPAP and that she doesn't want to be bothered with it again. Will continue to monitor. Patient isn't in any distress at this time.

## 2018-01-25 NOTE — Progress Notes (Signed)
Patient ID: [name], female   DOB: [birth year], 47 y.o.   MRN: [id number]    3 Days Post-Op  Subjective: Pt awake, but sleepy secondary to pain meds prior to hydrotherapy.  Still with pain.  Objective: Vital signs in last 24 hours: Temp:  [98.2 F (36.8 C)] 98.2 F (36.8 C) (07/18 0816) Pulse Rate:  [80-85] 84 (07/18 1035) Resp:  [8-15] 14 (07/18 1035) BP: (118-140)/(54-77) 140/77 (07/18 0741) SpO2:  [97 %-100 %] 97 % (07/18 1035) Weight:  [140.9 kg (310 lb 10.1 oz)] 140.9 kg (310 lb 10.1 oz) (07/18 0500) Last BM Date: 01/22/18  Intake/Output from previous day: 07/17 0701 - 07/18 0700 In: -  Out: 600 [Urine:600] Intake/Output this shift: Total I/O In: 3238.8 [I.V.:61.9; IV Piggyback:3176.9] Out: 250 [Urine:250]  PE: Skin/gu: mons still with erythema and induration.  Just medial to wound above labial is very induration and hard.  Wound itself is less clean today.  Some purulent drainage noted coming from superior left part of wound along a tunnel.  More superficial necrotic adipose tissue and fibrin noted today.  Left labial has a track that extends now the entire length of my pointer finger, at least 6cm which is new.  This track has tan purulent drainage which is new as well.  The tissue of the labial is necrotic.  Difficult to fully assess the entirety of the wound secondary to body habitus and difficulty positioning patient.  Lab Results:  Recent Labs    01/23/18 0932 01/24/18 0430  WBC 23.1* 19.6*  HGB 10.6* 10.1*  HCT 34.9* 33.3*  PLT 227 243   BMET Recent Labs    01/24/18 0430 01/25/18 0428  NA 140 140  K 4.7 4.6  CL 108 104  CO2 24 27  GLUCOSE 214* 193*  BUN 41* 39*  CREATININE 1.23* 1.06*  CALCIUM 9.2 8.7*   PT/INR No results for input(s): LABPROT, INR in the last 72 hours. CMP     Component Value Date/Time   NA 140 01/25/2018 0428   NA 137 01/08/2018 1117   K 4.6 01/25/2018 0428   CL 104 01/25/2018 0428   CO2 27 01/25/2018 0428   GLUCOSE  193 (H) 01/25/2018 0428   BUN 39 (H) 01/25/2018 0428   BUN 31 (H) 01/08/2018 1117   CREATININE 1.06 (H) 01/25/2018 0428   CREATININE 1.20 (H) 05/24/2016 0938   CALCIUM 8.7 (L) 01/25/2018 0428   PROT 7.2 01/22/2018 0341   PROT 7.8 01/08/2018 1117   ALBUMIN 2.4 (L) 01/22/2018 0341   ALBUMIN 4.1 01/08/2018 1117   AST 25 01/22/2018 0341   ALT 28 01/22/2018 0341   ALKPHOS 167 (H) 01/22/2018 0341   BILITOT 1.1 01/22/2018 0341   BILITOT 0.5 01/08/2018 1117   GFRNONAA >60 01/25/2018 0428   GFRAA >60 01/25/2018 0428   Lipase     Component Value Date/Time   LIPASE 82 (H) 07/19/2017 1942       Studies/Results: No results found.  Anti-infectives: Anti-infectives (From admission, onward)   Start     Dose/Rate Route Frequency Ordered Stop   01/19/18 1800  vancomycin (VANCOCIN) 1,500 mg in sodium chloride 0.9 % 500 mL IVPB     1,500 mg 250 mL/hr over 120 Minutes Intravenous Every 24 hours 01/19/18 1639     01/19/18 1700  aztreonam (AZACTAM) 1 g in sodium chloride 0.9 % 100 mL IVPB     1 g 200 mL/hr over 30 Minutes Intravenous Every 8 hours 01/19/18 1639  01/19/18 1700  metroNIDAZOLE (FLAGYL) IVPB 500 mg     500 mg 100 mL/hr over 60 Minutes Intravenous Every 8 hours 01/19/18 1639         Assessment/Plan Necrotizing soft tissue infection of Mons/vulva -POD 3, s/p I&D of vulvar infection. Wound is about 5x8x8cm in size. -decrease dilaudid to 0.5mg  q3 hrs prn to decrease sedation and breathing issues. On just nasal cannula currently -add tylenol scheduled as well as oxycodone -BID dressing changes -wound looks worse today with new necrotic tissue as well as thick purulent drainage that is new.  Likely needs to return to OR tomorrow for further evaluation, possible debridement, and washout.  Discussed with patient although sleepy.  Will D/W MD and then call husband if needed.  CAD CHF RBBB and LAFB COPD, on 4L O2 Hypoventilation syndrome Asthma  Uncontrolled DM Tobacco  abuse HTN Seizure disorder CVA, history of at age 48 History of DVTs  FEN -carb mod diet, likely NPO p MN VTE -Heparin 5000 units q8h ID -Azactam 7/12 -->Flagyl 7/12 -->Vancomycin 7/12-->     LOS: 6 days    Henreitta Cea , [facility] 01/25/2018, 11:05 AM Pager: [phone]

## 2018-01-26 ENCOUNTER — Encounter (HOSPITAL_COMMUNITY): Payer: Self-pay | Admitting: *Deleted

## 2018-01-26 ENCOUNTER — Inpatient Hospital Stay (HOSPITAL_COMMUNITY): Payer: BLUE CROSS/BLUE SHIELD | Admitting: Certified Registered"

## 2018-01-26 ENCOUNTER — Encounter (HOSPITAL_COMMUNITY)
Admission: EM | Disposition: A | Discharge status: Nursing Home (Includes ICF) | Payer: Self-pay | Source: Home / Self Care | Attending: Internal Medicine

## 2018-01-26 HISTORY — PX: INCISION AND DRAINAGE ABSCESS: SHX5864

## 2018-01-26 LAB — BASIC METABOLIC PANEL
Anion gap: 9 (ref 5–15)
BUN: 27 mg/dL — ABNORMAL HIGH (ref 6–20)
CO2: 26 mmol/L (ref 22–32)
Calcium: 8.3 mg/dL — ABNORMAL LOW (ref 8.9–10.3)
Chloride: 104 mmol/L (ref 98–111)
Creatinine, Ser: 0.85 mg/dL (ref 0.44–1.00)
GFR calc Af Amer: >60 mL/min (ref >60)
GFR calc non Af Amer: >60 mL/min (ref >60)
Glucose, Bld: 124 mg/dL — ABNORMAL HIGH (ref 70–99)
Potassium: 4.4 mmol/L (ref 3.5–5.1)
Sodium: 139 mmol/L (ref 135–145)

## 2018-01-26 LAB — GLUCOSE, CAPILLARY
Glucose-Capillary: 127 mg/dL — ABNORMAL HIGH (ref 70–99)
Glucose-Capillary: 154 mg/dL — ABNORMAL HIGH (ref 70–99)
Glucose-Capillary: 160 mg/dL — ABNORMAL HIGH (ref 70–99)
Glucose-Capillary: 173 mg/dL — ABNORMAL HIGH (ref 70–99)
Glucose-Capillary: 194 mg/dL — ABNORMAL HIGH (ref 70–99)

## 2018-01-26 LAB — CBC
HCT: 31.5 % — ABNORMAL LOW (ref 36.0–46.0)
Hemoglobin: 9.7 g/dL — ABNORMAL LOW (ref 12.0–15.0)
MCH: 26.7 pg (ref 26.0–34.0)
MCHC: 30.8 g/dL (ref 30.0–36.0)
MCV: 86.8 fL (ref 78.0–100.0)
Platelets: 274 10*3/uL (ref 150–400)
RBC: 3.63 MIL/uL — ABNORMAL LOW (ref 3.87–5.11)
RDW: 18.1 % — ABNORMAL HIGH (ref 11.5–15.5)
WBC: 18.9 10*3/uL — ABNORMAL HIGH (ref 4.0–10.5)

## 2018-01-26 SURGERY — INCISION AND DRAINAGE, ABSCESS
Anesthesia: General | Site: Vulva

## 2018-01-26 MED ORDER — ALBUTEROL SULFATE HFA 108 (90 BASE) MCG/ACT IN AERS
INHALATION_SPRAY | RESPIRATORY_TRACT | Status: DC | PRN
  Administered 2018-01-26: 4 via RESPIRATORY_TRACT

## 2018-01-26 MED ORDER — SUGAMMADEX SODIUM 200 MG/2ML IV SOLN
INTRAVENOUS | Status: DC | PRN
  Administered 2018-01-26: 300 mg via INTRAVENOUS

## 2018-01-26 MED ORDER — 0.9 % SODIUM CHLORIDE (POUR BTL) OPTIME
TOPICAL | Status: DC | PRN
  Administered 2018-01-26: 1000 mL

## 2018-01-26 MED ORDER — ONDANSETRON HCL 4 MG/2ML IJ SOLN
INTRAMUSCULAR | Status: AC
  Filled 2018-01-26: qty 2

## 2018-01-26 MED ORDER — ACETAMINOPHEN 325 MG PO TABS
650.0000 mg | ORAL_TABLET | Freq: Four times a day (QID) | ORAL | Status: DC
  Administered 2018-01-27 – 2018-02-05 (×35): 650 mg via ORAL
  Filled 2018-01-26 (×36): qty 2

## 2018-01-26 MED ORDER — PROPOFOL 10 MG/ML IV BOLUS
INTRAVENOUS | Status: AC
  Filled 2018-01-26: qty 20

## 2018-01-26 MED ORDER — ONDANSETRON HCL 4 MG/2ML IJ SOLN
INTRAMUSCULAR | Status: DC | PRN
  Administered 2018-01-26: 4 mg via INTRAVENOUS

## 2018-01-26 MED ORDER — PROPOFOL 10 MG/ML IV BOLUS
INTRAVENOUS | Status: DC | PRN
  Administered 2018-01-26: 50 mg via INTRAVENOUS
  Administered 2018-01-26: 110 mg via INTRAVENOUS

## 2018-01-26 MED ORDER — MEPERIDINE HCL 50 MG/ML IJ SOLN: 6.2500 mg | INTRAMUSCULAR | Status: DC | PRN

## 2018-01-26 MED ORDER — ONDANSETRON HCL 4 MG/2ML IJ SOLN
4.0000 mg | Freq: Once | INTRAMUSCULAR | Status: AC | PRN
  Administered 2018-01-26: 4 mg via INTRAVENOUS

## 2018-01-26 MED ORDER — SUGAMMADEX SODIUM 500 MG/5ML IV SOLN
INTRAVENOUS | Status: AC
  Filled 2018-01-26: qty 5

## 2018-01-26 MED ORDER — SUGAMMADEX SODIUM 200 MG/2ML IV SOLN
INTRAVENOUS | Status: AC
  Filled 2018-01-26: qty 2

## 2018-01-26 MED ORDER — ROCURONIUM BROMIDE 10 MG/ML (PF) SYRINGE
PREFILLED_SYRINGE | INTRAVENOUS | Status: DC | PRN
  Administered 2018-01-26: 30 mg via INTRAVENOUS

## 2018-01-26 MED ORDER — PHENYLEPHRINE 40 MCG/ML (10ML) SYRINGE FOR IV PUSH (FOR BLOOD PRESSURE SUPPORT)
PREFILLED_SYRINGE | INTRAVENOUS | Status: AC
  Filled 2018-01-26: qty 10

## 2018-01-26 MED ORDER — SODIUM CHLORIDE 0.9 % IR SOLN
Status: DC | PRN
  Administered 2018-01-26: 3000 mL

## 2018-01-26 MED ORDER — HYDROMORPHONE HCL 1 MG/ML IJ SOLN: 0.2500 mg | INTRAMUSCULAR | Status: DC | PRN

## 2018-01-26 MED ORDER — ROCURONIUM BROMIDE 10 MG/ML (PF) SYRINGE
PREFILLED_SYRINGE | INTRAVENOUS | Status: AC
  Filled 2018-01-26: qty 10

## 2018-01-26 MED ORDER — KETOROLAC TROMETHAMINE 30 MG/ML IJ SOLN
INTRAMUSCULAR | Status: AC
  Filled 2018-01-26: qty 1

## 2018-01-26 MED ORDER — HEPARIN SODIUM (PORCINE) 5000 UNIT/ML IJ SOLN
5000.0000 [IU] | Freq: Three times a day (TID) | INTRAMUSCULAR | Status: DC
  Administered 2018-01-26 – 2018-02-05 (×28): 5000 [IU] via SUBCUTANEOUS
  Filled 2018-01-26 (×29): qty 1

## 2018-01-26 MED ORDER — FENTANYL CITRATE (PF) 250 MCG/5ML IJ SOLN
INTRAMUSCULAR | Status: AC
  Filled 2018-01-26: qty 5

## 2018-01-26 MED ORDER — LIDOCAINE 2% (20 MG/ML) 5 ML SYRINGE
INTRAMUSCULAR | Status: DC | PRN
  Administered 2018-01-26: 100 mg via INTRAVENOUS

## 2018-01-26 MED ORDER — LACTATED RINGERS IV SOLN
INTRAVENOUS | Status: DC
  Administered 2018-01-26: 14:00:00 via INTRAVENOUS

## 2018-01-26 MED ORDER — FENTANYL CITRATE (PF) 250 MCG/5ML IJ SOLN
INTRAMUSCULAR | Status: DC | PRN
  Administered 2018-01-26: 150 ug via INTRAVENOUS
  Administered 2018-01-26 (×2): 50 ug via INTRAVENOUS

## 2018-01-26 MED ORDER — SUCCINYLCHOLINE CHLORIDE 200 MG/10ML IV SOSY
PREFILLED_SYRINGE | INTRAVENOUS | Status: DC | PRN
  Administered 2018-01-26: 120 mg via INTRAVENOUS

## 2018-01-26 MED ORDER — ONDANSETRON HCL 4 MG/2ML IJ SOLN
INTRAMUSCULAR | Status: AC
  Administered 2018-01-26: 4 mg via INTRAVENOUS
  Filled 2018-01-26: qty 2

## 2018-01-26 MED ORDER — LIDOCAINE 2% (20 MG/ML) 5 ML SYRINGE
INTRAMUSCULAR | Status: AC
  Filled 2018-01-26: qty 5

## 2018-01-26 SURGICAL SUPPLY — 45 items
BLADE SURG 15 STRL LF DISP TIS (BLADE) ×1 IMPLANT
BLADE SURG 15 STRL SS (BLADE) ×1
BNDG GAUZE ELAST 4 BULKY (GAUZE/BANDAGES/DRESSINGS) ×1 IMPLANT
BRIEF STRETCH FOR OB PAD LRG (UNDERPADS AND DIAPERS) ×2 IMPLANT
CANISTER SUCT 3000ML PPV (MISCELLANEOUS) ×2 IMPLANT
COVER SURGICAL LIGHT HANDLE (MISCELLANEOUS) ×2 IMPLANT
DRAPE LAPAROTOMY 100X72 PEDS (DRAPES) IMPLANT
DRAPE PROXIMA HALF (DRAPES) IMPLANT
DRSG ADAPTIC 3X8 NADH LF (GAUZE/BANDAGES/DRESSINGS) ×2 IMPLANT
DRSG PAD ABDOMINAL 8X10 ST (GAUZE/BANDAGES/DRESSINGS) ×2 IMPLANT
ELECT CAUTERY BLADE 6.4 (BLADE) ×2 IMPLANT
ELECT REM PT RETURN 9FT ADLT (ELECTROSURGICAL) ×2
ELECTRODE REM PT RTRN 9FT ADLT (ELECTROSURGICAL) ×1 IMPLANT
GAUZE IODOFORM PACK 1/2 7832 (GAUZE/BANDAGES/DRESSINGS) IMPLANT
GAUZE SPONGE 4X4 12PLY STRL (GAUZE/BANDAGES/DRESSINGS) ×2 IMPLANT
GLOVE BIOGEL M STRL SZ7.5 (GLOVE) ×2 IMPLANT
GLOVE BIOGEL PI IND STRL 8 (GLOVE) ×2 IMPLANT
GLOVE BIOGEL PI INDICATOR 8 (GLOVE) ×2
GOWN STRL REUS W/ TWL LRG LVL3 (GOWN DISPOSABLE) ×1 IMPLANT
GOWN STRL REUS W/TWL 2XL LVL3 (GOWN DISPOSABLE) ×2 IMPLANT
GOWN STRL REUS W/TWL LRG LVL3 (GOWN DISPOSABLE) ×1
HANDPIECE INTERPULSE COAX TIP (DISPOSABLE) ×1
KIT BASIN OR (CUSTOM PROCEDURE TRAY) ×2 IMPLANT
KIT TURNOVER KIT B (KITS) ×2 IMPLANT
LEGGING LITHOTOMY PAIR STRL (DRAPES) ×1 IMPLANT
MATRIX WOUND 3-LAYER 10X15 (Tissue) ×1 IMPLANT
MATRIX WOUND 3-LAYER 7X10 (Tissue) ×1 IMPLANT
MICROMATRIX 1000MG (Tissue) ×4 IMPLANT
NS IRRIG 1000ML POUR BTL (IV SOLUTION) ×2 IMPLANT
PACK LITHOTOMY IV (CUSTOM PROCEDURE TRAY) IMPLANT
PACK SURGICAL SETUP 50X90 (CUSTOM PROCEDURE TRAY) ×1 IMPLANT
PAD ABD 8X10 STRL (GAUZE/BANDAGES/DRESSINGS) ×1 IMPLANT
PAD ARMBOARD 7.5X6 YLW CONV (MISCELLANEOUS) ×2 IMPLANT
PENCIL BUTTON HOLSTER BLD 10FT (ELECTRODE) ×2 IMPLANT
SET HNDPC FAN SPRY TIP SCT (DISPOSABLE) IMPLANT
SOLUTION PARTIC MCRMTRX 1000MG (Tissue) IMPLANT
SPONGE LAP 18X18 RF (DISPOSABLE) ×1 IMPLANT
SPONGE LAP 18X18 X RAY DECT (DISPOSABLE) ×3 IMPLANT
SUT CHROMIC 3 0 PS 2 (SUTURE) ×2 IMPLANT
SWAB COLLECTION DEVICE MRSA (MISCELLANEOUS) IMPLANT
TOWEL OR 17X24 6PK STRL BLUE (TOWEL DISPOSABLE) ×2 IMPLANT
TOWEL OR 17X26 10 PK STRL BLUE (TOWEL DISPOSABLE) ×2 IMPLANT
TUBE CONNECTING 12X1/4 (SUCTIONS) ×2 IMPLANT
UNDERPAD 30X30 INCONTINENT (UNDERPADS AND DIAPERS) ×2 IMPLANT
YANKAUER SUCT BULB TIP NO VENT (SUCTIONS) ×2 IMPLANT

## 2018-01-26 NOTE — Transfer of Care (Signed)
Immediate Anesthesia Transfer of Care Note  Patient: [name]  Procedure(s) Performed: INCISION AND DEBRIDEMENT OF VULVAR NECROTIZING SOFT TISSUE INFECTION (N/A Vulva)  Patient Location: PACU  Anesthesia Type:General  Level of Consciousness: sedated and responds to stimulation  Airway & Oxygen Therapy: Patient Spontanous Breathing, Patient connected to face mask oxygen and nasal airway  Post-op Assessment: Report given to RN and Post -op Vital signs reviewed and stable  Post vital signs: Reviewed and stable  Last Vitals:  Vitals Value Taken Time  BP 168/69 01/26/2018  4:35 PM  Temp 36.3 C 01/26/2018  4:35 PM  Pulse 96 01/26/2018  4:36 PM  Resp 16 01/26/2018  4:36 PM  SpO2 95 % 01/26/2018  4:36 PM  Vitals shown include unvalidated device data.  Last Pain:  Vitals:   01/26/18 1235  TempSrc: Oral  PainSc:       Patients Stated Pain Goal: 0 (41/74/08 1448)  Complications: No apparent anesthesia complications

## 2018-01-26 NOTE — Progress Notes (Signed)
Pt incontinent of stool again - liquid brown. Pt on antibiotics. Cleaned and pads changed, peri care given.   Dressing to left labial wound soiled but only on top layers. Tape and ABD pad removed, overlying gauze also removed and replaced. Packing not changed at this time. Pt premedicated with prn Dilaudid, still had pain with any pressure on wound. Entire bilateral mons and panniculus reddened, warmer than surrounding skin, and tender to touch.  Plan remains for OR I&D today, scheduled for 2pm.  Discussed plan with patient and spouse at bedside.

## 2018-01-26 NOTE — Progress Notes (Signed)
Patient refused CPAP.

## 2018-01-26 NOTE — Anesthesia Preprocedure Evaluation (Signed)
Anesthesia Evaluation  Patient identified by MRN, date of birth, ID band Patient awake    Reviewed: Allergy & Precautions, NPO status , Patient's Chart, lab work & pertinent test results  Airway Mallampati: I  TM Distance: >3 FB Neck ROM: Full    Dental   Pulmonary COPD, former smoker,    Pulmonary exam normal        Cardiovascular hypertension, Pt. on medications + CAD  Normal cardiovascular exam     Neuro/Psych    GI/Hepatic GERD  Controlled and Medicated,  Endo/Other  diabetes, Type 2, Insulin Dependent  Renal/GU Renal InsufficiencyRenal disease     Musculoskeletal   Abdominal   Peds  Hematology   Anesthesia Other Findings   Reproductive/Obstetrics                             Anesthesia Physical Anesthesia Plan  ASA: III  Anesthesia Plan: General   Post-op Pain Management:    Induction: Intravenous  PONV Risk Score and Plan: 3 and Midazolam, Ondansetron and Treatment may vary due to age or medical condition  Airway Management Planned: Oral ETT  Additional Equipment:   Intra-op Plan:   Post-operative Plan: Extubation in OR  Informed Consent: I have reviewed the patients History and Physical, chart, labs and discussed the procedure including the risks, benefits and alternatives for the proposed anesthesia with the patient or authorized representative who has indicated his/her understanding and acceptance.     Plan Discussed with: CRNA and Surgeon  Anesthesia Plan Comments:         Anesthesia Quick Evaluation

## 2018-01-26 NOTE — Op Note (Signed)
01/26/2018  4:28 PM  PATIENT:  [name]  47 y.o. female  PRE-OPERATIVE DIAGNOSIS:  necrotizing soft tissue infection of vulvar region  POST-OPERATIVE DIAGNOSIS:  necrotizing soft tissue infection of vulvar region  PROCEDURE:  Procedure(s): EXCISIONAL DEBRIDEMENT OF VULVAR NECROTIZING SOFT TISSUE INFECTION (72 sq cm2) by scalpel Application of Acell micro matrix powder x2 (1000mg  x2) Application of 10 cm x 15 cm 3 layer Cytal Acell sheet Application of 7 cm x 10 cm 3 layer Cytal Acell sheet Pulsatile lavage  SURGEON:  Surgeon(s): Greer Pickerel, MD  ASSISTANTS: none   ANESTHESIA:   general  DRAINS: none   LOCAL MEDICATIONS USED:  NONE  SPECIMEN:  Source of Specimen:  vulvar skin/soft tissue  DISPOSITION OF SPECIMEN:  PATHOLOGY  COUNTS:  YES  INDICATION FOR PROCEDURE: 47 year old fairly obese Caucasian female who was admitted the other day with a necrotizing soft tissue infection of her left vulvar region.  She was taken to the operating room and underwent excisional debridement of the area.  She has been getting wet-to-dry dressings twice a day as well as hydrotherapy.  On exam there was concern for progressive ongoing infection so we recommended return to the operating room for further exam under anesthesia and excisional debridement.  I discussed at length the risk of the benefits with the patient.  Please see note for additional details  PROCEDURE: After obtaining informed consent the patient was taken to the OR 1 at [facility] and placed supine on the operating room table.  He is general endotracheal anesthesia was established.  Sequential compression devices were placed.  The patient was placed in lithotomy position with the appropriate padding.  Her pubic, vulvar region and proximal thighs were prepped and draped in the usual standard surgical fashion with Betadine.  She had a pre-existing open wound.  Surgical timeout was performed.  Patient received 5000 units of  subcutaneous heparin preoperatively.  She is on scheduled therapeutic antibiotics.  She had a left open wound that Dr. Ninfa Linden had performed an excisional debridement on a few days ago.    The wound involved her left vulvar region.  Unfortunately I could express purulent drainage more proximally heading toward her mons pubis and groin area.  While the skin was intact and there was grayish dishwater drainage as well as frank pus that I could express from the surrounding subcutaneous fat tissue.  I ended up sharply excising 72 cm of skin and subcutaneous fat with the scalpel.  I resected all visible necrotic tissue.  See operative photo below.  I then pulsatile lavaged the area with 3 L of saline.  Hemostasis was achieved with electrocautery.  I then placed 2000 mg of a cell micro matrix powder into the wound bed.  I then sutured a 10 x 15 cm sheet as well as a 7 x 10 cm sheet of cytal to the wound edges with a 3-0 chromic suture that is covering the powder.  K-Y jelly was then applied followed by a moistened Kerlix followed by 4 x 4 gauze and ABD pads which were taped to the skin with paper tape.  The patient was placed back in supine position.  She was extubated and taken to the recovery room in stable condition.  All needle, instrument sponge counts were correct x2.  There were no immediate complications.  PLAN OF CARE: Admit to inpatient   Excisional debridement:    Tool used for debridement (curette, scapel, etc.) scalpel   Frequency of surgical debridement.  Second operative debridement    Area and depth of devitalized tissue removed from wound.  72 cm of skin and soft tissue   Blood loss and description of tissue removed.  30 mL's, skin and necrotic soft  tissue   Was there any viable tissue removed (measurements): No   PATIENT DISPOSITION:  PACU - hemodynamically stable.   Delay start of Pharmacological VTE agent (>24hrs) due to surgical blood loss or risk of bleeding:   no      Leighton Ruff. Redmond Pulling, MD, FACS General, Bariatric, & Minimally Invasive Surgery [facility], [place]

## 2018-01-26 NOTE — Evaluation (Signed)
Physical Therapy Evaluation Patient Details Name: [name] MRN: [id number] DOB: Jan 25, 1971 Today's Date: 01/26/2018   History of Present Illness  Pt is a 47 y.o. female presenting with necortizing soft tissue infection of mons pubis/vulva and secondary sepsis. Pt s/p I&D labial/vulvar area on 7/15 with plans for repeat I&D on 7/19. PMHx: COPD, Morbid obesity, COPD, Seizure disorider, Bipolar disorider, Sleep apnea, Essential HTN, Diabetes, IBS, CKD.    Clinical Impression  [name] admitted with the above listed diagnosis. [name] present for session and reporting that patient was largely independent at home prior to admission, with only intermittent assist needed. Patient today requiring Total A +2-3 for bed level mobility with max verbal and tactile cueing to promote assist by patient for bed mobility. PT currently recommending SNF at discharge to progress functional mobility prior to return home. PT to continue to follow acutely to maximize mobility as tolerated.     Follow Up Recommendations SNF;Supervision/Assistance - 24 hour    Equipment Recommendations  Other (comment)(TBD )    Recommendations for Other Services       Precautions / Restrictions Precautions Precautions: Fall Precaution Comments: 2L O2 during the day and 4L at night at baseline Restrictions Weight Bearing Restrictions: No      Mobility  Bed Mobility Overal bed mobility: Needs Assistance Bed Mobility: Rolling Rolling: Total assist;+2 for physical assistance         General bed mobility comments: verbal and tactile cueing needed for bending knee to assist with rolling; verbal cueing for hand placement; Roll R <> L with use of bed pad; total A for repositioning  Transfers                 General transfer comment: deferred  Ambulation/Gait                Stairs            Wheelchair Mobility    Modified Rankin (Stroke Patients Only)       Balance Overall balance  assessment: History of Falls                                           Pertinent Vitals/Pain Pain Assessment: Faces Faces Pain Scale: Hurts even more Pain Location: genitals Pain Descriptors / Indicators: Grimacing;Sharp Pain Intervention(s): Limited activity within patient's tolerance;Monitored during session;Repositioned    Home Living Family/patient expects to be discharged to:: Private residence Living Arrangements: Spouse/significant other Available Help at Discharge: Family;Available 24 hours/day Type of Home: House Home Access: Stairs to enter   [employer] of Steps: 3 Home Layout: One level Home Equipment: Walker - 2 wheels;Walker - 4 wheels;Tub bench;Grab bars - tub/shower      Prior Function Level of Independence: Needs assistance   Gait / Transfers Assistance Needed: Rollator for mobility  ADL's / Homemaking Assistance Needed: [name] reports pt requires occasional light assist with BADL.        Hand Dominance   Dominant Hand: Right    Extremity/Trunk Assessment   Upper Extremity Assessment Upper Extremity Assessment: Defer to OT evaluation    Lower Extremity Assessment Lower Extremity Assessment: Generalized weakness       Communication   Communication: No difficulties  Cognition Arousal/Alertness: Awake/alert Behavior During Therapy: WFL for tasks assessed/performed Overall Cognitive Status: Impaired/Different from baseline Area of Impairment: Following commands;Safety/judgement;Awareness;Problem solving  Following Commands: Follows one step commands with increased time;Follows one step commands inconsistently Safety/Judgement: Decreased awareness of safety;Decreased awareness of deficits Awareness: Intellectual Problem Solving: Slow processing;Decreased initiation;Difficulty sequencing;Requires verbal cues;Requires tactile cues General Comments: unsure of cognitive baseline; appropriate  conversation but slow to respond to questions; cueing throughout mobility required      General Comments General comments (skin integrity, edema, etc.): VSS throughout    Exercises     Assessment/Plan    PT Assessment Patient needs continued PT services  PT Problem List Decreased strength;Decreased activity tolerance;Decreased balance;Decreased mobility;Decreased coordination;Decreased cognition;Decreased knowledge of use of DME;Decreased safety awareness;Decreased knowledge of precautions       PT Treatment Interventions DME instruction;Gait training;Functional mobility training;Therapeutic activities;Therapeutic exercise;Balance training;Neuromuscular re-education;Patient/family education    PT Goals (Current goals can be found in the Care Plan section)  Acute Rehab PT Goals Patient Stated Goal: return home PT Goal Formulation: With patient Time For Goal Achievement: 02/09/18 Potential to Achieve Goals: Fair    Frequency Min 3X/week   Barriers to discharge        Co-evaluation PT/OT/SLP Co-Evaluation/Treatment: Yes Reason for Co-Treatment: Complexity of the patient's impairments (multi-system involvement);For patient/therapist safety PT goals addressed during session: Mobility/safety with mobility OT goals addressed during session: ADL's and self-care       AM-PAC PT "6 Clicks" Daily Activity  Outcome Measure Difficulty turning over in bed (including adjusting bedclothes, sheets and blankets)?: Unable Difficulty moving from lying on back to sitting on the side of the bed? : Unable Difficulty sitting down on and standing up from a chair with arms (e.g., wheelchair, bedside commode, etc,.)?: Unable Help needed moving to and from a bed to chair (including a wheelchair)?: Total Help needed walking in hospital room?: Total Help needed climbing 3-5 steps with a railing? : Total 6 Click Score: 6    End of Session Equipment Utilized During Treatment: Oxygen Activity  Tolerance: Patient tolerated treatment well Patient left: in bed;with call bell/phone within reach;with family/visitor present Nurse Communication: Mobility status PT Visit Diagnosis: Unsteadiness on feet (R26.81);Other abnormalities of gait and mobility (R26.89);Muscle weakness (generalized) (M62.81);History of falling (Z91.81)    Time: 1006-1040 PT Time Calculation (min) (ACUTE ONLY): 34 min   Charges:   PT Evaluation $PT Eval Moderate Complexity: 1 Mod     PT G Codes:       Lanney Gins, PT, DPT 01/26/18 11:30 AM Pager: [phone]

## 2018-01-26 NOTE — Evaluation (Signed)
Occupational Therapy Evaluation Patient Details Name: [name] MRN: [id number] DOB: 06/03/1971 Today's Date: 01/26/2018    History of Present Illness Pt is a 47 y.o. female presenting with necortizing soft tissue infection of mons pubis/vulva and secondary sepsis. Pt s/p I&D labial/vulvar area on 7/15 with plans for repeat I&D on 7/19. PMHx: COPD, Morbid obesity, COPD, Seizure disorider, Bipolar disorider, Sleep apnea, Essential HTN, Diabetes, IBS, CKD.   Clinical Impression   Per husband, pt managing ADL primarily on her own PTA with occasional assist from family. Currently pt total assist +2-3 for ADL at bed level and bed level mobility. Recommending SNF for follow up to maximize independence and safety with ADL and functional mobility prior to return home. Pt would benefit from continued skilled OT to address established goals.    Follow Up Recommendations  SNF;Supervision/Assistance - 24 hour    Equipment Recommendations  3 in 1 bedside commode    Recommendations for Other Services       Precautions / Restrictions Precautions Precautions: Fall Precaution Comments: 2L O2 during the day and 4L at night at baseline Restrictions Weight Bearing Restrictions: No      Mobility Bed Mobility Overal bed mobility: Needs Assistance Bed Mobility: Rolling Rolling: Total assist;+2 for physical assistance         General bed mobility comments: Cues for bending up knee and reaching for roll but pt requires max-total assist to perform tasks. Use of bed pad to facilitate roll to R x1, L x1.  Transfers                      Balance Overall balance assessment: History of Falls                                         ADL either performed or assessed with clinical judgement   ADL Overall ADL's : Needs assistance/impaired Eating/Feeding: NPO   Grooming: Minimal assistance;Bed level   Upper Body Bathing: Maximal assistance;Bed level   Lower Body  Bathing: Total assistance;Bed level;+2 for physical assistance   Upper Body Dressing : Maximal assistance;Bed level   Lower Body Dressing: Total assistance;+2 for physical assistance;Bed level       Toileting- Clothing Manipulation and Hygiene: Total assistance;+2 for physical assistance;Bed level Toileting - Clothing Manipulation Details (indicate cue type and reason): Total assist +3 for bed level peri care following incontinent BM       General ADL Comments: Attempted discussing SNF follow up with pt and husband; husband would like to try to take pt home if possible--reports he has resources set up through [facility] care     Vision         Perception     Praxis      Pertinent Vitals/Pain Pain Assessment: Faces Faces Pain Scale: Hurts even more Pain Location: genitals Pain Descriptors / Indicators: Grimacing;Sharp Pain Intervention(s): Monitored during session;Limited activity within patient's tolerance     Hand Dominance Right   Extremity/Trunk Assessment Upper Extremity Assessment Upper Extremity Assessment: Generalized weakness   Lower Extremity Assessment Lower Extremity Assessment: Defer to PT evaluation       Communication Communication Communication: No difficulties   Cognition Arousal/Alertness: Awake/alert Behavior During Therapy: WFL for tasks assessed/performed Overall Cognitive Status: Impaired/Different from baseline Area of Impairment: Following commands;Safety/judgement;Awareness;Problem solving  Following Commands: Follows one step commands with increased time;Follows one step commands inconsistently Safety/Judgement: Decreased awareness of safety;Decreased awareness of deficits Awareness: Intellectual Problem Solving: Slow processing;Decreased initiation;Difficulty sequencing;Requires verbal cues;Requires tactile cues General Comments: Unsure of cognitive baseline; pt slow but appropriate to respond to  questions. Needs multiple simple cues for participation in bed mobility.   General Comments  VSS throughout    Exercises     Shoulder Instructions      Home Living Family/patient expects to be discharged to:: Private residence Living Arrangements: Spouse/significant other Available Help at Discharge: Family;Available 24 hours/day Type of Home: House Home Access: Stairs to enter [employer] of Steps: 3   Home Layout: One level     Bathroom Shower/Tub: Tub/shower unit;Curtain   Biochemist, clinical: Standard     Home Equipment: Environmental consultant - 2 wheels;Walker - 4 wheels;Tub bench;Grab bars - tub/shower          Prior Functioning/Environment Level of Independence: Needs assistance  Gait / Transfers Assistance Needed: Rollator for mobility ADL's / Homemaking Assistance Needed: Husband reports pt requires occasional light assist with BADL.            OT Problem List: Decreased strength;Decreased range of motion;Decreased activity tolerance;Impaired balance (sitting and/or standing);Decreased cognition;Decreased safety awareness;Decreased knowledge of use of DME or AE;Decreased knowledge of precautions;Cardiopulmonary status limiting activity;Obesity;Pain;Increased edema      OT Treatment/Interventions: Self-care/ADL training;Therapeutic exercise;Energy conservation;DME and/or AE instruction;Therapeutic activities;Patient/family education;Balance training    OT Goals(Current goals can be found in the care plan section) Acute Rehab OT Goals Patient Stated Goal: return home OT Goal Formulation: With patient/family Time For Goal Achievement: 02/09/18 Potential to Achieve Goals: Fair  OT Frequency: Min 2X/week   Barriers to D/C:            Co-evaluation PT/OT/SLP Co-Evaluation/Treatment: Yes Reason for Co-Treatment: Complexity of the patient's impairments (multi-system involvement);For patient/therapist safety   OT goals addressed during session: ADL's and  self-care      AM-PAC PT "6 Clicks" Daily Activity     Outcome Measure Help from another person eating meals?: Total Help from another person taking care of personal grooming?: A Little Help from another person toileting, which includes using toliet, bedpan, or urinal?: Total Help from another person bathing (including washing, rinsing, drying)?: Total Help from another person to put on and taking off regular upper body clothing?: A Lot Help from another person to put on and taking off regular lower body clothing?: Total 6 Click Score: 9   End of Session Equipment Utilized During Treatment: Oxygen Nurse Communication: Mobility status;Other (comment)(pt had BM during session; cleaned but needs dressing change)  Activity Tolerance: Patient tolerated treatment well;Patient limited by pain Patient left: in bed;with call bell/phone within reach;with family/visitor present  OT Visit Diagnosis: Unsteadiness on feet (R26.81);Muscle weakness (generalized) (M62.81);History of falling (Z91.81);Pain Pain - part of body: (genitals)                Time: 1006-1040 OT Time Calculation (min): 34 min Charges:  OT General Charges $OT Visit: 1 Visit OT Evaluation $OT Eval Moderate Complexity: 1 Mod G-Codes:     Rawan Riendeau A. Ulice Brilliant, M.S., OTR/L Acute Rehab Department: [phone]  Binnie Kand 01/26/2018, 11:06 AM

## 2018-01-26 NOTE — Progress Notes (Signed)
PROGRESS NOTE    [name]  DVV:[id number] DOB: [birth year] DOA: 01/19/2018 PCP: Pleas Koch, NP    Brief Narrative:  47 y.o.femalewith medical history significant for but not limited to poorly controlled resistant t2dm, morbid obesity, copd with possible superimposed obesity hypoventilation syndrome, on chronic O 2 (2L during the day, 4 at night), osa on nightly cpcp, htn, dCHF, and CAD  Presenting with 4 day history of vulvar pain,  And limited for uncontrolled diabetes with mons pubis cellulitis and secondary sepsis.  She was given 2 L of normal saline and antibiotics initiated at the ED prior to her admission   Assessment & Plan:   Principal Problem:   Necrotizing soft tissue infection Active Problems:   Morbid (severe) obesity due to excess calories (HCC)   Tobacco abuse   COPD GOLD 0    Seizure disorder (HCC)   Bipolar disorder (HCC)   Sleep apnea   Essential hypertension   Diabetes (HCC)   IBS (irritable bowel syndrome)   Sepsis (HCC)   Elevated serum hCG   Open wound of genital labia   Vulvar abscess   Poorly controlled type 2 diabetes mellitus ([place])   DNR (do not resuscitate)   CKD (chronic kidney disease), stage III ([place])   1. Necrotizing soft tissue infection of Mons/ vulva- went to OR 7/15 - SP I&D labial/ vulvar area 01/22/18 by Gen Surgery - pcn allergic, Patient had been continued on  IV aztreonam / metronidazole/vancomycin - Patient was seen by palliative care 7/15, patient wants the following:             - DNR             - No trach             - Does not want to be re-intubated.              - No feeding tubes             - Not keen on dialysis; not a good long term candidateregardless given      co-morbidities and overall weakness - On baseline O2 at present - Wound culture growing VRE -Had discussed with ID. D/c vanc and started linezolid on 7/18. Stopped SSRI. -Discussed with General surgery. Wounds noted to be more purulent,  recommendation for surgical debridement today   2. Uncontrolled type 2 diabetes mellitus: - metformin currently on hold - doesn't appear to be on any long-acting insulin at home - currently on high dose BID SSI starting at [facility] > 200; diabetes team is consulting and we will prob need to make changes because for wound healing need to keep BS < 180 -U500 has been started. Glucose better controlled   3. Acute on chronic kidney injury: better - Cr baseline is 1.4- 1.8 (CKD III). Cr now at baseline  - diuretics on hold (metolazone, torsemide and Aldactone)  - dc'd nsaids  -Labs reviewed. Renal function stable  4. elevated HCG:  Reportedly has not had sexual intercourse for past 4-5 years  follow clinically -  Plan for close outpatient workup if persistent   5. COPD / OSA:    - sees pulm in office for COPD gold stage 0 w nocturnal hypoxemia (on noct O2 at 3L as of sept 2018 office visit)  - Supportive care, p.r.n. Albuterol, on symbicort at home  - no wheezing currently - Continue with O2 as needed   6.  Chronic diastolic CHF:   - admitted  here by cardiology once in 2017 and once late 2018 for dCHF exacerbation - diuretics on hold in view of sepsis - follow daily wts, - repeat bmet in AM  7.  DNR:  Seen by palliative care 7/15, see their notes, remains DNR and no re-intubation  DVT prophylaxis: Heparin subq Code Status: DNR Family Communication: Pt in room, family not at bedside Disposition Plan: Uncertain at this time  Consultants:   Palliative care  General Surgery    Procedures:  SP I&D labial/ vulvar area 01/22/18 by Gen Surgery     Antimicrobials: Anti-infectives (From admission, onward)   Start     Dose/Rate Route Frequency Ordered Stop   01/25/18 1530  [MAR Hold]  linezolid (ZYVOX) IVPB 600 mg     (MAR Hold since Fri 01/26/2018 at 1404. Reason: Transfer to a Procedural area.)  Note to Pharmacy:  Have stopped fluoxetine   600 mg 300 mL/hr over 60 Minutes  Intravenous Every 12 hours 01/25/18 1437     01/19/18 1800  vancomycin (VANCOCIN) 1,500 mg in sodium chloride 0.9 % 500 mL IVPB  Status:  Discontinued     1,500 mg 250 mL/hr over 120 Minutes Intravenous Every 24 hours 01/19/18 1639 01/25/18 1437   01/19/18 1700  [MAR Hold]  aztreonam (AZACTAM) 1 g in sodium chloride 0.9 % 100 mL IVPB     (MAR Hold since Fri 01/26/2018 at 1404. Reason: Transfer to a Procedural area.)   1 g 200 mL/hr over 30 Minutes Intravenous Every 8 hours 01/19/18 1639     01/19/18 1700  [MAR Hold]  metroNIDAZOLE (FLAGYL) IVPB 500 mg     (MAR Hold since Fri 01/26/2018 at 1404. Reason: Transfer to a Procedural area.)   500 mg 100 mL/hr over 60 Minutes Intravenous Every 8 hours 01/19/18 1639        Subjective: Anxious about surgery today  Objective: Vitals:   01/26/18 1147 01/26/18 1235 01/26/18 1342 01/26/18 1413  BP: (!) 145/69  111/63   Pulse: 85  76   Resp: 15  14   Temp:  98.2 F (36.8 C)    TempSrc:  Oral    SpO2: 90%  96%   Weight:    (!) 139.8 kg (308 lb 3.3 oz)  Height:    5\' 4"  (1.626 m)    Intake/Output Summary (Last 24 hours) at 01/26/2018 1439 Last data filed at 01/26/2018 0422 Gross per 24 hour  Intake 981.69 ml  Output 950 ml  Net 31.69 ml   Filed Weights   01/25/18 0500 01/26/18 0425 01/26/18 1413  Weight: (!) 140.9 kg (310 lb 10.1 oz) (!) 139.8 kg (308 lb 3.3 oz) (!) 139.8 kg (308 lb 3.3 oz)    Examination: General exam: Awake, laying in bed, in nad Respiratory system: Normal respiratory effort, no wheezing Cardiovascular system: regular rate, s1, s2 Gastrointestinal system: Soft, nondistended, positive BS Central nervous system: CN2-12 grossly intact, strength intact Extremities: Perfused, no clubbing Skin: Normal skin turgor, no notable skin lesions seen Psychiatry: Mood normal // no visual hallucinations    Data Reviewed: I have personally reviewed following labs and imaging studies  CBC: Recent Labs  Lab 01/19/18 1526   01/21/18 0317 01/22/18 0341 01/22/18 2026 01/23/18 0932 01/24/18 0430 01/26/18 0240  WBC 24.6*   < > 20.8* 19.9*  --  23.1* 19.6* 18.9*  NEUTROABS 21.4*  --   --   --   --   --   --   --   HGB  11.7*   < > 10.4* 10.2* 11.2* 10.6* 10.1* 9.7*  HCT 36.3   < > 33.8* 33.1* 33.0* 34.9* 33.3* 31.5*  MCV 83.8   < > 87.3 86.4  --  87.5 87.4 86.8  PLT 186   < > 167 183  --  227 243 274   < > = values in this interval not displayed.   Basic Metabolic Panel: Recent Labs  Lab 01/22/18 0341 01/22/18 2026 01/23/18 0932 01/24/18 0430 01/25/18 0428 01/26/18 0240  NA 131* 134* 136 140 140 139  K 4.0 4.0 4.5 4.7 4.6 4.4  CL 97*  --  103 108 104 104  CO2 22  --  22 24 27 26   GLUCOSE 381*  --  86 214* 193* 124*  BUN 56*  --  49* 41* 39* 27*  CREATININE 1.71*  --  1.36* 1.23* 1.06* 0.85  CALCIUM 8.2*  --  8.6* 9.2 8.7* 8.3*   GFR: Estimated Creatinine Clearance: 112.1 mL/min (by C-G formula based on SCr of 0.85 mg/dL). Liver Function Tests: Recent Labs  Lab 01/19/18 1526 01/21/18 0317 01/22/18 0341  AST 28 22 25   ALT 25 27 28   ALKPHOS 118 138* 167*  BILITOT 1.3* 1.2 1.1  PROT 8.0 7.5 7.2  ALBUMIN 3.2* 2.7* 2.4*   No results for input(s): LIPASE, AMYLASE in the last 168 hours. No results for input(s): AMMONIA in the last 168 hours. Coagulation Profile: No results for input(s): INR, PROTIME in the last 168 hours. Cardiac Enzymes: No results for input(s): CKTOTAL, CKMB, CKMBINDEX, TROPONINI in the last 168 hours. BNP (last 3 results) No results for input(s): PROBNP in the last 8760 hours. HbA1C: No results for input(s): HGBA1C in the last 72 hours. CBG: Recent Labs  Lab 01/25/18 1216 01/25/18 1702 01/25/18 2115 01/26/18 0810 01/26/18 1234  GLUCAP 154* 117* 123* 127* 154*   Lipid Profile: No results for input(s): CHOL, HDL, LDLCALC, TRIG, CHOLHDL, LDLDIRECT in the last 72 hours. Thyroid Function Tests: No results for input(s): TSH, T4TOTAL, FREET4, T3FREE, THYROIDAB in the  last 72 hours. Anemia Panel: No results for input(s): VITAMINB12, FOLATE, FERRITIN, TIBC, IRON, RETICCTPCT in the last 72 hours. Sepsis Labs: Recent Labs  Lab 01/19/18 1535 01/19/18 1718 01/19/18 2042 01/20/18 0336  LATICACIDVEN 3.54* 3.06* 1.50 1.8    Recent Results (from the past 240 hour(s))  Blood Culture (routine x 2)     Status: None   Collection Time: 01/19/18  4:26 PM  Result Value Ref Range Status   Specimen Description BLOOD SITE NOT SPECIFIED  Final   Special Requests   Final    BOTTLES DRAWN AEROBIC AND ANAEROBIC Blood Culture adequate volume   Culture   Final    NO GROWTH 5 DAYS Performed at [facility], [address], [place], [place] [address]    Report Status 01/24/2018 FINAL  Final  Blood Culture (routine x 2)     Status: None   Collection Time: 01/19/18  4:31 PM  Result Value Ref Range Status   Specimen Description BLOOD BLOOD RIGHT FOREARM  Final   Special Requests   Final    BOTTLES DRAWN AEROBIC AND ANAEROBIC Blood Culture adequate volume   Culture   Final    NO GROWTH 5 DAYS Performed at [facility], [place] [address] [place], [place] [address]    Report Status 01/24/2018 FINAL  Final  Surgical pcr screen     Status: None   Collection Time: 01/22/18  1:27 PM  Result Value Ref Range Status   MRSA, PCR NEGATIVE NEGATIVE Final   Staphylococcus aureus NEGATIVE NEGATIVE Final    Comment: (NOTE) The Xpert SA Assay (FDA approved for NASAL specimens in patients 42 years of age and older), is one component of a comprehensive surveillance program. It is not intended to diagnose infection nor to guide or monitor treatment. Performed at [facility], [place] [address], [place], [place] [address]   Aerobic/Anaerobic Culture (surgical/deep wound)     Status: None (Preliminary result)   Collection Time: 01/22/18  6:49 PM  Result Value Ref Range Status   Specimen Description ABSCESS LABIA  Final   Special Requests PATIENT ON FOLLOWING VANC  FLAGYL AZTREONAM  Final   Gram Stain   Final    MODERATE WBC PRESENT, PREDOMINANTLY PMN ABUNDANT GRAM NEGATIVE RODS ABUNDANT GRAM POSITIVE COCCI Performed at [facility], [address], [place],  [address]    Culture   Final    RARE ENTEROCOCCUS CASSELIFLAVUS FEW STREPTOCOCCUS GROUP C NO ANAEROBES ISOLATED; CULTURE IN PROGRESS FOR 5 DAYS    Report Status PENDING  Incomplete   Organism ID, Bacteria ENTEROCOCCUS CASSELIFLAVUS  Final      Susceptibility   Enterococcus casseliflavus - MIC*    AMPICILLIN <=2 SENSITIVE Sensitive     VANCOMYCIN RESISTANT Resistant     GENTAMICIN SYNERGY SENSITIVE Sensitive     LINEZOLID 2 SENSITIVE Sensitive     * RARE ENTEROCOCCUS CASSELIFLAVUS  Culture, Urine     Status: Abnormal   Collection Time: 01/22/18  8:21 PM  Result Value Ref Range Status   Specimen Description URINE, CLEAN CATCH  Final   Special Requests   Final    NONE Performed at [facility], [place] [address] [place],  [address]    Culture MULTIPLE SPECIES PRESENT, SUGGEST RECOLLECTION (A)  Final   Report Status 01/25/2018 FINAL  Final     Radiology Studies: No results found.  Scheduled Meds: . [MAR Hold] acetaminophen  650 mg Oral Q4H  . [MAR Hold] busPIRone  30 mg Oral BID  . [MAR Hold] famotidine  20 mg Oral Daily  . [MAR Hold] heparin  5,000 Units Subcutaneous Q8H  . [MAR Hold]  HYDROmorphone (DILAUDID) injection  1 mg Intravenous Once  . [MAR Hold] insulin regular human CONCENTRATED  170-370 Units Subcutaneous BID WC  . [MAR Hold] mometasone-formoterol  2 puff Inhalation BID  . [MAR Hold] pregabalin  300 mg Oral BID  . [MAR Hold] traZODone  150 mg Oral QHS   Continuous Infusions: . sodium chloride 50 mL/hr at 01/25/18 2030  . [MAR Hold] aztreonam 1 g (01/26/18 0809)  . lactated ringers    . [MAR Hold] linezolid (ZYVOX) IV 600 mg (01/26/18 1157)  . [MAR Hold] metronidazole 500 mg (01/26/18 0908)     LOS: 7 days   Marylu Lund, MD [facility] Pager [phone]  If 7PM-7AM, please contact night-coverage [email or web address] Password [facility] 01/26/2018, 2:39 PM

## 2018-01-26 NOTE — Anesthesia Procedure Notes (Signed)
Procedure Name: Intubation Date/Time: 01/26/2018 2:50 PM Performed by: Myna Bright, CRNA Pre-anesthesia Checklist: Patient identified, Suction available, Patient being monitored and Emergency Drugs available Patient Re-evaluated:Patient Re-evaluated prior to induction Oxygen Delivery Method: Circle system utilized Preoxygenation: Pre-oxygenation with 100% oxygen Ventilation: Mask ventilation with difficulty Laryngoscope Size: Mac and 3 Grade View: Grade I Tube type: Oral Tube size: 7.0 mm Number of attempts: 1 Airway Equipment and Method: Stylet Placement Confirmation: ETT inserted through vocal cords under direct vision,  positive ETCO2 and breath sounds checked- equal and bilateral Secured at: 21 cm Tube secured with: Tape Dental Injury: Teeth and Oropharynx as per pre-operative assessment

## 2018-01-26 NOTE — Progress Notes (Signed)
Pt to OR. No dentures or hearing aides to remove, no piercings, only gown on. Report given

## 2018-01-26 NOTE — Progress Notes (Signed)
Hydrotherapy Cancellation Note  Patient Details Name: [name] MRN: [id number] DOB: 03/17/1971   Cancelled Treatment:    Reason Eval/Treat Not Completed: Other (comment). Pt scheduled to have surgical debridement later today. Will defer hydrotherapy today and follow-up as ordered post-op.   Shary Decamp Maycok 01/26/2018, 8:49 AM  Suanne Marker PT [phone]

## 2018-01-26 NOTE — Interval H&P Note (Signed)
History and Physical Interval Note:  01/26/2018 2:23 PM  [name]  has presented today for surgery, with the diagnosis of necrotizing soft tissue infection of vulvar region  The various methods of treatment have been discussed with the patient and family. After consideration of risks, benefits and other options for treatment, the patient has consented to  Procedure(s): INCISION AND DEBRIDEMENT OF VULVAR NECROTIZING SOFT TISSUE INFECTION (N/A) as a surgical intervention .  The patient's history has been reviewed, patient examined, no change in status, stable for surgery.  I have reviewed the patient's chart and labs.  Questions were answered to the patient's satisfaction.    Discussed risk including but not limited bleeding, ongoing infection, need for more procedures, blood clots, injury to surrounding structures, scarring, perioperative cardiac/pulm events  Leighton Ruff. Redmond Pulling, MD, FACS General, Bariatric, & Minimally Invasive Surgery [facility]   Greer Pickerel

## 2018-01-27 LAB — AEROBIC/ANAEROBIC CULTURE (SURGICAL/DEEP WOUND)

## 2018-01-27 LAB — CBC
HCT: 31.2 % — ABNORMAL LOW (ref 36.0–46.0)
Hemoglobin: 9.3 g/dL — ABNORMAL LOW (ref 12.0–15.0)
MCH: 26.3 pg (ref 26.0–34.0)
MCHC: 29.8 g/dL — ABNORMAL LOW (ref 30.0–36.0)
MCV: 88.4 fL (ref 78.0–100.0)
Platelets: 288 10*3/uL (ref 150–400)
RBC: 3.53 MIL/uL — ABNORMAL LOW (ref 3.87–5.11)
RDW: 18.3 % — ABNORMAL HIGH (ref 11.5–15.5)
WBC: 18.9 10*3/uL — ABNORMAL HIGH (ref 4.0–10.5)

## 2018-01-27 LAB — BASIC METABOLIC PANEL
Anion gap: 9 (ref 5–15)
BUN: 24 mg/dL — ABNORMAL HIGH (ref 6–20)
CO2: 25 mmol/L (ref 22–32)
Calcium: 8.2 mg/dL — ABNORMAL LOW (ref 8.9–10.3)
Chloride: 105 mmol/L (ref 98–111)
Creatinine, Ser: 0.98 mg/dL (ref 0.44–1.00)
GFR calc Af Amer: >60 mL/min (ref >60)
GFR calc non Af Amer: >60 mL/min (ref >60)
Glucose, Bld: 220 mg/dL — ABNORMAL HIGH (ref 70–99)
Potassium: 5.2 mmol/L — ABNORMAL HIGH (ref 3.5–5.1)
Sodium: 139 mmol/L (ref 135–145)

## 2018-01-27 LAB — GLUCOSE, CAPILLARY
Glucose-Capillary: 208 mg/dL — ABNORMAL HIGH (ref 70–99)
Glucose-Capillary: 272 mg/dL — ABNORMAL HIGH (ref 70–99)
Glucose-Capillary: 252 mg/dL — ABNORMAL HIGH (ref 70–99)
Glucose-Capillary: 176 mg/dL — ABNORMAL HIGH (ref 70–99)

## 2018-01-27 LAB — AEROBIC/ANAEROBIC CULTURE W GRAM STAIN (SURGICAL/DEEP WOUND)

## 2018-01-27 NOTE — Plan of Care (Signed)
Continue current care plan 

## 2018-01-27 NOTE — Progress Notes (Signed)
Pharmacy Antibiotic Note  [name] is a 47 y.o. female admitted on 01/19/2018 with L groin wound.  Pharmacy has been following for Aztreonam, and Flagyl dosing.  Pt is s/p  I&D on 7/19 of the wound with plans to return on Monday.  Cultures revealed Enterococcus casseliflavus which was Vanc resistant and she was changed to Linezolid therapy.  She seems to be tolerating antibiotics appropriately however, not much change in her WBC which remains elevated at 18.9K.  She is afebrile.  Plan: Continue Metronidazole 500 mg q8h Continue Aztreonam 1 g q8h Linezolid 600mg  q12 hr Follow up abx LOT plans and narrow as able Monitor renal fxn, culture data, and clinical progress  Height: 5\' 4"  (162.6 cm) Weight: (!) 306 lb (138.8 kg) IBW/kg (Calculated) : 54.7  Temp (24hrs), Avg:98.1 F (36.7 C), Min:97.3 F (36.3 C), Max:99.1 F (37.3 C)  Recent Labs  Lab 01/22/18 0341 01/22/18 1719 01/23/18 0932 01/24/18 0430 01/25/18 0428 01/26/18 0240 01/27/18 0245  WBC 19.9*  --  23.1* 19.6*  --  18.9* 18.9*  CREATININE 1.71*  --  1.36* 1.23* 1.06* 0.85 0.98  VANCOTROUGH  --  17  --   --   --   --   --     Estimated Creatinine Clearance: 96.8 mL/min (by C-G formula based on SCr of 0.98 mg/dL).    Allergies  Allergen Reactions  . Adhesive [Tape] Rash and Other (See Comments)    TAKES OFF THE SKIN (CERTAIN MEDICAL TAPES DO THIS!!)  . Metoprolol Shortness Of Breath    Occurrence of shortness of breath after 3 days  . Montelukast Shortness Of Breath  . Morphine Sulfate Anaphylaxis, Shortness Of Breath and Nausea And Vomiting    Swollen Throat - Able to tolerate dilaudid  . Penicillins Anaphylaxis, Hives and Shortness Of Breath    Throat swells Has patient had a PCN reaction causing immediate rash, facial/tongue/throat swelling, SOB or lightheadedness with hypotension: Yes Has patient had a PCN reaction causing severe rash involving mucus membranes or skin necrosis: No Has patient had a PCN  reaction that required hospitalization: Yes Has patient had a PCN reaction occurring within the last 10 years: No If all of the above answers are "NO", then may proceed with Cephalosporin use.   . Prednisone Anaphylaxis  . Diltiazem Swelling  . Gabapentin Swelling   Antimicrobials this admission: Vancomycin 7/12>>7/18 Metronidazole 7/12>> Aztreonam 7/12>> Linezolid 7/18>  Dose adjustments this admission: 7/15 VT = 17, no change  Microbiology results: 7/12 blood x 2 >> NG - final 7/15 wound culture >> Enterococcus casseliflavus  Rober Minion, PharmD., MS Clinical Pharmacist Pager:  [phone] Thank you for allowing pharmacy to be part of this patients care team.  **Pharmacist phone directory can now be found on [email or web address] ([email or web address]).  Listed under [place].  01/27/2018 11:48 AM

## 2018-01-27 NOTE — Progress Notes (Signed)
Patient ID: [name], female   DOB: 1971-03-09, 47 y.o.   MRN: [id number]    1 Day Post-Op  Subjective: Pt had dilaudid at 0500 and still very sleepy.  Objective: Vital signs in last 24 hours: Temp:  [97.3 F (36.3 C)-99.1 F (37.3 C)] 98.6 F (37 C) (07/20 0727) Pulse Rate:  [76-98] 81 (07/20 0727) Resp:  [9-23] 22 (07/20 0727) BP: (111-168)/(53-83) 135/53 (07/20 0727) SpO2:  [90 %-100 %] 99 % (07/20 0727) Weight:  [138.8 kg (306 lb)-139.8 kg (308 lb 3.3 oz)] 138.8 kg (306 lb) (07/20 0500) Last BM Date: 01/26/18  Intake/Output from previous day: 07/19 0701 - 07/20 0700 In: 2924.1 [I.V.:1368.8; IV Piggyback:1505.3] Out: 400 [Urine:350; Blood:50] Intake/Output this shift: Total I/O In: -  Out: 200 [Urine:200]  PE: Skin: wound is clean as far as I can tell.  She has A-cell in place and so the base of the wound is really not visible through the sheet of A-cell.  Wound repacked.  Mons still with erythema and edema.  Lab Results:  Recent Labs    01/26/18 0240 01/27/18 0245  WBC 18.9* 18.9*  HGB 9.7* 9.3*  HCT 31.5* 31.2*  PLT 274 288   BMET Recent Labs    01/26/18 0240 01/27/18 0245  NA 139 139  K 4.4 5.2*  CL 104 105  CO2 26 25  GLUCOSE 124* 220*  BUN 27* 24*  CREATININE 0.85 0.98  CALCIUM 8.3* 8.2*   PT/INR No results for input(s): LABPROT, INR in the last 72 hours. CMP     Component Value Date/Time   NA 139 01/27/2018 0245   NA 137 01/08/2018 1117   K 5.2 (H) 01/27/2018 0245   CL 105 01/27/2018 0245   CO2 25 01/27/2018 0245   GLUCOSE 220 (H) 01/27/2018 0245   BUN 24 (H) 01/27/2018 0245   BUN 31 (H) 01/08/2018 1117   CREATININE 0.98 01/27/2018 0245   CREATININE 1.20 (H) 05/24/2016 0938   CALCIUM 8.2 (L) 01/27/2018 0245   PROT 7.2 01/22/2018 0341   PROT 7.8 01/08/2018 1117   ALBUMIN 2.4 (L) 01/22/2018 0341   ALBUMIN 4.1 01/08/2018 1117   AST 25 01/22/2018 0341   ALT 28 01/22/2018 0341   ALKPHOS 167 (H) 01/22/2018 0341   BILITOT 1.1  01/22/2018 0341   BILITOT 0.5 01/08/2018 1117   GFRNONAA >60 01/27/2018 0245   GFRAA >60 01/27/2018 0245   Lipase     Component Value Date/Time   LIPASE 82 (H) 07/19/2017 1942       Studies/Results: No results found.  Anti-infectives: Anti-infectives (From admission, onward)   Start     Dose/Rate Route Frequency Ordered Stop   01/25/18 1530  linezolid (ZYVOX) IVPB 600 mg    Note to Pharmacy:  Have stopped fluoxetine   600 mg 300 mL/hr over 60 Minutes Intravenous Every 12 hours 01/25/18 1437     01/19/18 1800  vancomycin (VANCOCIN) 1,500 mg in sodium chloride 0.9 % 500 mL IVPB  Status:  Discontinued     1,500 mg 250 mL/hr over 120 Minutes Intravenous Every 24 hours 01/19/18 1639 01/25/18 1437   01/19/18 1700  aztreonam (AZACTAM) 1 g in sodium chloride 0.9 % 100 mL IVPB     1 g 200 mL/hr over 30 Minutes Intravenous Every 8 hours 01/19/18 1639     01/19/18 1700  metroNIDAZOLE (FLAGYL) IVPB 500 mg     500 mg 100 mL/hr over 60 Minutes Intravenous Every 8 hours 01/19/18 1639  Assessment/Plan Necrotizing soft tissue infection of Mons/vulva -POD4/1, s/p I&D of vulvar infection.  -patient taken back to OR yesterday and had wound significantly more debrided.  A-cell placed. -daily dressing changes and prn.  Plan to return to OR on Monday for EUA and further debridement if necessary and reapplication of A-cell -wound cx VRE, wrote orders to place patient on contact precautions.  CAD CHF RBBB and LAFB COPD, on 4L O2 Hypoventilation syndrome Asthma  Uncontrolled DM Tobacco abuse HTN Seizure disorder CVA, history of at age 52 History of DVTs  FEN -carb mod diet, likely NPO p MN VTE -Heparin 5000 units q8h ID -Azactam 7/12 -->Flagyl 7/12 -->Vancomycin 7/12-->7/18  Zyvox 7/18 -->   LOS: 8 days    Henreitta Cea , [facility] Surgery 01/27/2018, 8:25 AM Pager: [phone]

## 2018-01-27 NOTE — Anesthesia Postprocedure Evaluation (Signed)
Anesthesia Post Note  Patient: [name]  Procedure(s) Performed: INCISION AND DEBRIDEMENT OF VULVAR NECROTIZING SOFT TISSUE INFECTION (N/A Vulva)     Patient location during evaluation: PACU Anesthesia Type: General Level of consciousness: awake and alert Pain management: pain level controlled Vital Signs Assessment: post-procedure vital signs reviewed and stable Respiratory status: spontaneous breathing, nonlabored ventilation, respiratory function stable and patient connected to nasal cannula oxygen Cardiovascular status: blood pressure returned to baseline and stable Postop Assessment: no apparent nausea or vomiting Anesthetic complications: no    Last Vitals:  Vitals:   01/27/18 0314 01/27/18 0727  BP: 127/71 (!) 135/53  Pulse: 80 81  Resp: 11 (!) 22  Temp: 37.3 C 37 C  SpO2: 98% 99%    Last Pain:  Vitals:   01/27/18 0727  TempSrc: Oral  PainSc: 0-No pain                 Montez Hageman

## 2018-01-27 NOTE — Progress Notes (Signed)
PROGRESS NOTE    [name]  OZD:[id number] DOB: 12-23-1970 DOA: 01/19/2018 PCP: Pleas Koch, NP    Brief Narrative:  47 y.o.femalewith medical history significant for but not limited to poorly controlled resistant t2dm, morbid obesity, copd with possible superimposed obesity hypoventilation syndrome, on chronic O 2 (2L during the day, 4 at night), osa on nightly cpcp, htn, dCHF, and CAD  Presenting with 4 day history of vulvar pain,  And limited for uncontrolled diabetes with mons pubis cellulitis and secondary sepsis.  She was given 2 L of normal saline and antibiotics initiated at the ED prior to her admission   Assessment & Plan:   Principal Problem:   Necrotizing soft tissue infection Active Problems:   Morbid (severe) obesity due to excess calories (HCC)   Tobacco abuse   COPD GOLD 0    Seizure disorder (HCC)   Bipolar disorder (HCC)   Sleep apnea   Essential hypertension   Diabetes (HCC)   IBS (irritable bowel syndrome)   Sepsis (HCC)   Elevated serum hCG   Open wound of genital labia   Vulvar abscess   Poorly controlled type 2 diabetes mellitus ([place])   DNR (do not resuscitate)   CKD (chronic kidney disease), stage III ([place])   1. Necrotizing soft tissue infection of Mons/ vulva- went to OR 7/15 - SP I&D labial/ vulvar area 01/22/18 by Gen Surgery - pcn allergic, Patient had been continued on  IV aztreonam / metronidazole/vancomycin - Patient was seen by palliative care 7/15, patient wants the following:             - DNR             - No trach             - Does not want to be re-intubated.              - No feeding tubes             - Not keen on dialysis; not a good long term candidateregardless given      co-morbidities and overall weakness - On baseline O2 at present - Wound culture growing VRE -Had discussed with ID. D/c vanc and started linezolid on 7/18. Stopped SSRI. -Discussed with General surgery. Wounds noted to be more purulent and patient  underwent surgical debridement 7/19 with extensive debridement required -Surgery recommending repeat OR debridement 7/22. Cont with IV abx   2. Uncontrolled type 2 diabetes mellitus: - metformin currently on hold - doesn't appear to be on any long-acting insulin at home - currently on high dose BID SSI starting at [facility] > 200; diabetes team is consulting and we will prob need to make changes because for wound healing need to keep BS < 180 -U500 has been started. Glucose better controlled   3. Acute on chronic kidney injury: better - Cr baseline is 1.4- 1.8 (CKD III). Cr now at baseline  - diuretics on hold (metolazone, torsemide and Aldactone)  - have stopped NSAIDs  -Labs reviewed. Renal function remains stable  4. elevated HCG:  Reportedly has not had sexual intercourse for past 4-5 years  follow clinically  -Recommend close outpatient follow up   5. COPD / OSA:    - sees pulm in office for COPD gold stage 0 w nocturnal hypoxemia (on noct O2 at 3L as of sept 2018 office visit)  - Supportive care, p.r.n. Albuterol, on symbicort at home  - no wheezing currently - Presently on  room air. Continue to monitor. Continues to refuse cpap at night   6.  Chronic diastolic CHF:   - admitted here by cardiology once in 2017 and once late 2018 for dCHF exacerbation - diuretics on hold in view of sepsis - follow daily wts, - Labs reviewed. Stable  7.  DNR:  Seen by palliative care 7/15, see their notes, remains DNR and no re-intubation  DVT prophylaxis: Heparin subq Code Status: DNR Family Communication: Pt in room, family not at bedside Disposition Plan: Uncertain at this time  Consultants:   Palliative care  General Surgery    Procedures:  -SP I&D labial/ vulvar area 01/22/18 by Gen Surgery -Surgical debridement 7/19    Antimicrobials: Anti-infectives (From admission, onward)   Start     Dose/Rate Route Frequency Ordered Stop   01/25/18 1530  linezolid (ZYVOX) IVPB 600 mg     Note to Pharmacy:  Have stopped fluoxetine   600 mg 300 mL/hr over 60 Minutes Intravenous Every 12 hours 01/25/18 1437     01/19/18 1800  vancomycin (VANCOCIN) 1,500 mg in sodium chloride 0.9 % 500 mL IVPB  Status:  Discontinued     1,500 mg 250 mL/hr over 120 Minutes Intravenous Every 24 hours 01/19/18 1639 01/25/18 1437   01/19/18 1700  aztreonam (AZACTAM) 1 g in sodium chloride 0.9 % 100 mL IVPB     1 g 200 mL/hr over 30 Minutes Intravenous Every 8 hours 01/19/18 1639     01/19/18 1700  metroNIDAZOLE (FLAGYL) IVPB 500 mg     500 mg 100 mL/hr over 60 Minutes Intravenous Every 8 hours 01/19/18 1639        Subjective: Without complaints this AM. Denies pain  Objective: Vitals:   01/27/18 0314 01/27/18 0500 01/27/18 0727 01/27/18 1137  BP: 127/71  (!) 135/53 134/82  Pulse: 80  81 82  Resp: 11  (!) 22 (!) 9  Temp: 99.1 F (37.3 C)  98.6 F (37 C) 98.1 F (36.7 C)  TempSrc: Oral  Oral Oral  SpO2: 98%  99% 97%  Weight:  (!) 138.8 kg (306 lb)    Height:        Intake/Output Summary (Last 24 hours) at 01/27/2018 1342 Last data filed at 01/27/2018 1311 Gross per 24 hour  Intake 3896.8 ml  Output 900 ml  Net 2996.8 ml   Filed Weights   01/26/18 0425 01/26/18 1413 01/27/18 0500  Weight: (!) 139.8 kg (308 lb 3.3 oz) (!) 139.8 kg (308 lb 3.3 oz) (!) 138.8 kg (306 lb)    Examination: General exam: Conversant, in no acute distress Respiratory system: normal chest rise, clear, no audible wheezing Cardiovascular system: regular rhythm, s1-s2 Gastrointestinal system: Nondistended, nontender, pos BS Central nervous system: No seizures, no tremors Extremities: No cyanosis, no joint deformities Skin: No rashes, no pallor Psychiatry: Affect normal // no auditory hallucinations   Data Reviewed: I have personally reviewed following labs and imaging studies  CBC: Recent Labs  Lab 01/22/18 0341 01/22/18 2026 01/23/18 0932 01/24/18 0430 01/26/18 0240 01/27/18 0245  WBC  19.9*  --  23.1* 19.6* 18.9* 18.9*  HGB 10.2* 11.2* 10.6* 10.1* 9.7* 9.3*  HCT 33.1* 33.0* 34.9* 33.3* 31.5* 31.2*  MCV 86.4  --  87.5 87.4 86.8 88.4  PLT 183  --  227 243 274 106   Basic Metabolic Panel: Recent Labs  Lab 01/23/18 0932 01/24/18 0430 01/25/18 0428 01/26/18 0240 01/27/18 0245  NA 136 140 140 139 139  K 4.5 4.7  4.6 4.4 5.2*  CL 103 108 104 104 105  CO2 22 24 27 26 25   GLUCOSE 86 214* 193* 124* 220*  BUN 49* 41* 39* 27* 24*  CREATININE 1.36* 1.23* 1.06* 0.85 0.98  CALCIUM 8.6* 9.2 8.7* 8.3* 8.2*   GFR: Estimated Creatinine Clearance: 96.8 mL/min (by C-G formula based on SCr of 0.98 mg/dL). Liver Function Tests: Recent Labs  Lab 01/21/18 0317 01/22/18 0341  AST 22 25  ALT 27 28  ALKPHOS 138* 167*  BILITOT 1.2 1.1  PROT 7.5 7.2  ALBUMIN 2.7* 2.4*   No results for input(s): LIPASE, AMYLASE in the last 168 hours. No results for input(s): AMMONIA in the last 168 hours. Coagulation Profile: No results for input(s): INR, PROTIME in the last 168 hours. Cardiac Enzymes: No results for input(s): CKTOTAL, CKMB, CKMBINDEX, TROPONINI in the last 168 hours. BNP (last 3 results) No results for input(s): PROBNP in the last 8760 hours. HbA1C: No results for input(s): HGBA1C in the last 72 hours. CBG: Recent Labs  Lab 01/26/18 1425 01/26/18 1635 01/26/18 2122 01/27/18 0821 01/27/18 1243  GLUCAP 160* 173* 194* 208* 272*   Lipid Profile: No results for input(s): CHOL, HDL, LDLCALC, TRIG, CHOLHDL, LDLDIRECT in the last 72 hours. Thyroid Function Tests: No results for input(s): TSH, T4TOTAL, FREET4, T3FREE, THYROIDAB in the last 72 hours. Anemia Panel: No results for input(s): VITAMINB12, FOLATE, FERRITIN, TIBC, IRON, RETICCTPCT in the last 72 hours. Sepsis Labs: No results for input(s): PROCALCITON, LATICACIDVEN in the last 168 hours.  Recent Results (from the past 240 hour(s))  Blood Culture (routine x 2)     Status: None   Collection Time: 01/19/18  4:26  PM  Result Value Ref Range Status   Specimen Description BLOOD SITE NOT SPECIFIED  Final   Special Requests   Final    BOTTLES DRAWN AEROBIC AND ANAEROBIC Blood Culture adequate volume   Culture   Final    NO GROWTH 5 DAYS Performed at [facility], [address] [place], [place] [address]    Report Status 01/24/2018 FINAL  Final  Blood Culture (routine x 2)     Status: None   Collection Time: 01/19/18  4:31 PM  Result Value Ref Range Status   Specimen Description BLOOD BLOOD RIGHT FOREARM  Final   Special Requests   Final    BOTTLES DRAWN AEROBIC AND ANAEROBIC Blood Culture adequate volume   Culture   Final    NO GROWTH 5 DAYS Performed at [facility], [place] [address], [place], [place] [address]    Report Status 01/24/2018 FINAL  Final  Surgical pcr screen     Status: None   Collection Time: 01/22/18  1:27 PM  Result Value Ref Range Status   MRSA, PCR NEGATIVE NEGATIVE Final   Staphylococcus aureus NEGATIVE NEGATIVE Final    Comment: (NOTE) The Xpert SA Assay (FDA approved for NASAL specimens in patients 23 years of age and older), is one component of a comprehensive surveillance program. It is not intended to diagnose infection nor to guide or monitor treatment. Performed at [facility], [place] [address] [place], [place] [address]   Aerobic/Anaerobic Culture (surgical/deep wound)     Status: None   Collection Time: 01/22/18  6:49 PM  Result Value Ref Range Status   Specimen Description ABSCESS LABIA  Final   Special Requests PATIENT ON FOLLOWING VANC FLAGYL AZTREONAM  Final   Gram Stain   Final  MODERATE WBC PRESENT, PREDOMINANTLY PMN ABUNDANT GRAM NEGATIVE RODS ABUNDANT GRAM POSITIVE COCCI    Culture   Final    RARE ENTEROCOCCUS CASSELIFLAVUS FEW STREPTOCOCCUS GROUP C NO ANAEROBES ISOLATED Performed at [facility], [place] [address], [place], [place] [address]    Report Status 01/27/2018 FINAL  Final   Organism ID, Bacteria ENTEROCOCCUS  CASSELIFLAVUS  Final      Susceptibility   Enterococcus casseliflavus - MIC*    AMPICILLIN <=2 SENSITIVE Sensitive     VANCOMYCIN RESISTANT Resistant     GENTAMICIN SYNERGY SENSITIVE Sensitive     LINEZOLID 2 SENSITIVE Sensitive     * RARE ENTEROCOCCUS CASSELIFLAVUS  Culture, Urine     Status: Abnormal   Collection Time: 01/22/18  8:21 PM  Result Value Ref Range Status   Specimen Description URINE, CLEAN CATCH  Final   Special Requests   Final    NONE Performed at [facility], [place] [address], [place], [place] [address]    Culture MULTIPLE SPECIES PRESENT, SUGGEST RECOLLECTION (A)  Final   Report Status 01/25/2018 FINAL  Final     Radiology Studies: No results found.  Scheduled Meds: . acetaminophen  650 mg Oral Q6H  . busPIRone  30 mg Oral BID  . famotidine  20 mg Oral Daily  . heparin  5,000 Units Subcutaneous Q8H  . insulin regular human CONCENTRATED  170-370 Units Subcutaneous BID WC  . mometasone-formoterol  2 puff Inhalation BID  . pregabalin  300 mg Oral BID  . traZODone  150 mg Oral QHS   Continuous Infusions: . sodium chloride Stopped (01/27/18 0946)  . aztreonam Stopped (01/27/18 0943)  . linezolid (ZYVOX) IV Stopped (01/27/18 1236)  . metronidazole Stopped (01/27/18 1054)     LOS: 8 days   Marylu Lund, MD [facility] Pager [phone]  If 7PM-7AM, please contact night-coverage [email or web address] Password [email or web address] 01/27/2018, 1:42 PM

## 2018-01-28 LAB — BASIC METABOLIC PANEL
Anion gap: 7 (ref 5–15)
BUN: 21 mg/dL — ABNORMAL HIGH (ref 6–20)
CO2: 28 mmol/L (ref 22–32)
Calcium: 8.5 mg/dL — ABNORMAL LOW (ref 8.9–10.3)
Chloride: 107 mmol/L (ref 98–111)
Creatinine, Ser: 0.89 mg/dL (ref 0.44–1.00)
GFR calc Af Amer: >60 mL/min (ref >60)
GFR calc non Af Amer: >60 mL/min (ref >60)
Glucose, Bld: 80 mg/dL (ref 70–99)
Potassium: 4.4 mmol/L (ref 3.5–5.1)
Sodium: 142 mmol/L (ref 135–145)

## 2018-01-28 LAB — BLOOD GAS, ARTERIAL
Acid-Base Excess: 3.2 mmol/L — ABNORMAL HIGH (ref 0.0–2.0)
Bicarbonate: 27.8 mmol/L (ref 20.0–28.0)
Drawn by: 105521
FIO2: 28
O2 Content: 2 L/min
O2 Saturation: 96.8 %
Patient temperature: 98.6
pCO2 arterial: 47 mmHg (ref 32.0–48.0)
pH, Arterial: 7.39 (ref 7.350–7.450)
pO2, Arterial: 86.6 mmHg (ref 83.0–108.0)

## 2018-01-28 LAB — CBC
HCT: 33.2 % — ABNORMAL LOW (ref 36.0–46.0)
Hemoglobin: 9.8 g/dL — ABNORMAL LOW (ref 12.0–15.0)
MCH: 26.4 pg (ref 26.0–34.0)
MCHC: 29.5 g/dL — ABNORMAL LOW (ref 30.0–36.0)
MCV: 89.5 fL (ref 78.0–100.0)
Platelets: 299 10*3/uL (ref 150–400)
RBC: 3.71 MIL/uL — ABNORMAL LOW (ref 3.87–5.11)
RDW: 18.6 % — ABNORMAL HIGH (ref 11.5–15.5)
WBC: 13.3 10*3/uL — ABNORMAL HIGH (ref 4.0–10.5)

## 2018-01-28 LAB — GLUCOSE, CAPILLARY
Glucose-Capillary: 73 mg/dL (ref 70–99)
Glucose-Capillary: 63 mg/dL — ABNORMAL LOW (ref 70–99)
Glucose-Capillary: 108 mg/dL — ABNORMAL HIGH (ref 70–99)
Glucose-Capillary: 145 mg/dL — ABNORMAL HIGH (ref 70–99)
Glucose-Capillary: 153 mg/dL — ABNORMAL HIGH (ref 70–99)

## 2018-01-28 NOTE — Progress Notes (Signed)
Patient ID: [name], female   DOB: 12-01-1970, 47 y.o.   MRN: [id number]    2 Days Post-Op  Subjective: Pt more encephalopathic this morning.  Refusing CPAP.  Thinks she was just crowned the queen by the TV show she was watching.    Objective: Vital signs in last 24 hours: Temp:  [97.6 F (36.4 C)-98.1 F (36.7 C)] 98 F (36.7 C) (07/21 0738) Pulse Rate:  [74-82] 80 (07/21 0738) Resp:  [9-15] 12 (07/21 0738) BP: (104-134)/(56-82) 127/70 (07/21 0738) SpO2:  [97 %-99 %] 98 % (07/21 0401) Weight:  [143.9 kg (317 lb 3.9 oz)] 143.9 kg (317 lb 3.9 oz) (07/21 0500) Last BM Date: 01/27/18  Intake/Output from previous day: 07/20 0701 - 07/21 0700 In: 1952.7 [P.O.:840; I.V.:112.5; IV Piggyback:1000.2] Out: 750 [Urine:750] Intake/Output this shift: No intake/output data recorded.  PE: Gen: lethargic, but awake Skin: wound is stable, but A cell in place and can't see the tissue well.  The remaining skip flap of her labia is dried out and hard and dark red/purple.  Not sure if that will remain viable.  Mons is still edematous and erythematous.  Lab Results:  Recent Labs    01/27/18 0245 01/28/18 0223  WBC 18.9* 13.3*  HGB 9.3* 9.8*  HCT 31.2* 33.2*  PLT 288 299   BMET Recent Labs    01/27/18 0245 01/28/18 0223  NA 139 142  K 5.2* 4.4  CL 105 107  CO2 25 28  GLUCOSE 220* 80  BUN 24* 21*  CREATININE 0.98 0.89  CALCIUM 8.2* 8.5*   PT/INR No results for input(s): LABPROT, INR in the last 72 hours. CMP     Component Value Date/Time   NA 142 01/28/2018 0223   NA 137 01/08/2018 1117   K 4.4 01/28/2018 0223   CL 107 01/28/2018 0223   CO2 28 01/28/2018 0223   GLUCOSE 80 01/28/2018 0223   BUN 21 (H) 01/28/2018 0223   BUN 31 (H) 01/08/2018 1117   CREATININE 0.89 01/28/2018 0223   CREATININE 1.20 (H) 05/24/2016 0938   CALCIUM 8.5 (L) 01/28/2018 0223   PROT 7.2 01/22/2018 0341   PROT 7.8 01/08/2018 1117   ALBUMIN 2.4 (L) 01/22/2018 0341   ALBUMIN 4.1 01/08/2018  1117   AST 25 01/22/2018 0341   ALT 28 01/22/2018 0341   ALKPHOS 167 (H) 01/22/2018 0341   BILITOT 1.1 01/22/2018 0341   BILITOT 0.5 01/08/2018 1117   GFRNONAA >60 01/28/2018 0223   GFRAA >60 01/28/2018 0223   Lipase     Component Value Date/Time   LIPASE 82 (H) 07/19/2017 1942       Studies/Results: No results found.  Anti-infectives: Anti-infectives (From admission, onward)   Start     Dose/Rate Route Frequency Ordered Stop   01/25/18 1530  linezolid (ZYVOX) IVPB 600 mg    Note to Pharmacy:  Have stopped fluoxetine   600 mg 300 mL/hr over 60 Minutes Intravenous Every 12 hours 01/25/18 1437     01/19/18 1800  vancomycin (VANCOCIN) 1,500 mg in sodium chloride 0.9 % 500 mL IVPB  Status:  Discontinued     1,500 mg 250 mL/hr over 120 Minutes Intravenous Every 24 hours 01/19/18 1639 01/25/18 1437   01/19/18 1700  aztreonam (AZACTAM) 1 g in sodium chloride 0.9 % 100 mL IVPB     1 g 200 mL/hr over 30 Minutes Intravenous Every 8 hours 01/19/18 1639     01/19/18 1700  metroNIDAZOLE (FLAGYL) IVPB 500 mg  500 mg 100 mL/hr over 60 Minutes Intravenous Every 8 hours 01/19/18 1639         Assessment/Plan Necrotizing soft tissue infection of Mons/vulva -POD5/2, s/p I&D of vulvar infection.  -patient taken back to OR Friday and had wound significantly more debrided.  A-cell placed. -daily dressing changes and prn.  Plan to return to OR on Monday for EUA and further debridement if necessary and reapplication of A-cell -wound cx VRE, wrote orders to place patient on contact precautions. -more encephalopathic today.  D/W primary, check ABG as she has refused her CPAP  CAD CHF RBBB and LAFB COPD, on 4L O2 Hypoventilation syndrome Asthma  Uncontrolled DM Tobacco abuse HTN Seizure disorder CVA, history of at age 47 History of DVTs  FEN -carb mod diet, NPO p MN VTE -Heparin 5000 units q8h ID -Azactam 7/12 -->Flagyl 7/12 -->Vancomycin 7/12-->7/18  Zyvox 7/18  -->   LOS: 9 days    Henreitta Cea , [facility] Surgery 01/28/2018, 8:40 AM Pager: [phone]

## 2018-01-28 NOTE — Progress Notes (Addendum)
PROGRESS NOTE    [name]  TDD:[id number] DOB: 06/12/1971 DOA: 01/19/2018 PCP: Pleas Koch, NP    Brief Narrative:  47 y.o.femalewith medical history significant for but not limited to poorly controlled resistant t2dm, morbid obesity, copd with possible superimposed obesity hypoventilation syndrome, on chronic O 2 (2L during the day, 4 at night), osa on nightly cpcp, htn, dCHF, and CAD  Presenting with 4 day history of vulvar pain,  And limited for uncontrolled diabetes with mons pubis cellulitis and secondary sepsis.  She was given 2 L of normal saline and antibiotics initiated at the ED prior to her admission   Assessment & Plan:   Principal Problem:   Necrotizing soft tissue infection Active Problems:   Morbid (severe) obesity due to excess calories (HCC)   Tobacco abuse   COPD GOLD 0    Seizure disorder (HCC)   Bipolar disorder (HCC)   Sleep apnea   Essential hypertension   Diabetes (HCC)   IBS (irritable bowel syndrome)   Sepsis (HCC)   Elevated serum hCG   Open wound of genital labia   Vulvar abscess   Poorly controlled type 2 diabetes mellitus ([place])   DNR (do not resuscitate)   CKD (chronic kidney disease), stage III ([place])   1. Necrotizing soft tissue infection of Mons/ vulva- went to OR 7/15 - SP I&D labial/ vulvar area 01/22/18 by Gen Surgery - pcn allergic, Patient had been continued on  IV aztreonam / metronidazole/vancomycin - Patient was seen by palliative care 7/15, patient wants the following:             - DNR             - No trach             - Does not want to be re-intubated.              - No feeding tubes             - Not keen on dialysis; not a good long term candidateregardless given      co-morbidities and overall weakness - On baseline O2 at present - Wound culture growing VRE -Had discussed with ID. D/c vanc and started linezolid on 7/18. Stopped SSRI. -Discussed with General surgery. Wounds noted to be more purulent and patient  underwent surgical debridement 7/19 with extensive debridement performed -Surgery is planning repeat OR debridement 7/22. Will cont with IV abx   2. Uncontrolled type 2 diabetes mellitus: - metformin currently on hold - doesn't appear to be on any long-acting insulin at home - currently on high dose BID SSI starting at [facility] > 200; diabetes team is consulting and we will prob need to make changes because for wound healing need to keep BS < 180 -U500 has been started. Blood glucose improved   3. Acute on chronic kidney injury:  - Cr baseline is 1.4- 1.8 (CKD III). Cr now at baseline  -diuretics on hold (metolazone, torsemide and Aldactone)  - have stopped NSAIDs  -Labs reviewed. Renal function remains stable  4. elevated HCG:  Reportedly has not had sexual intercourse for past 4-5 years  follow clinically  -Recommend close outpatient follow up   5. COPD / OSA:    - sees pulm in office for COPD gold stage 0 w nocturnal hypoxemia (on noct O2 at 3L as of sept 2018 office visit)  - Supportive care, p.r.n. Albuterol, on symbicort at home  - no wheezing currently - Presently  on room air. Continue to monitor. Continues to refuse cpap at night   6.  Chronic diastolic CHF:   - admitted here by cardiology once in 2017 and once late 2018 for dCHF exacerbation - diuretics on hold in view of sepsis - follow daily wts, - Labs reviewed. Stable  7.  DNR:  Seen by palliative care 7/15, see their notes, remains DNR and no re-intubation  DVT prophylaxis: Heparin subq Code Status: DNR Family Communication: Pt in room, family not at bedside Disposition Plan: Uncertain at this time  Consultants:   Palliative care  General Surgery  Procedures:  -SP I&D labial/ vulvar area 01/22/18 by Gen Surgery -Surgical debridement 7/19    Antimicrobials: Anti-infectives (From admission, onward)   Start     Dose/Rate Route Frequency Ordered Stop   01/25/18 1530  linezolid (ZYVOX) IVPB 600 mg    Note  to Pharmacy:  Have stopped fluoxetine   600 mg 300 mL/hr over 60 Minutes Intravenous Every 12 hours 01/25/18 1437     01/19/18 1800  vancomycin (VANCOCIN) 1,500 mg in sodium chloride 0.9 % 500 mL IVPB  Status:  Discontinued     1,500 mg 250 mL/hr over 120 Minutes Intravenous Every 24 hours 01/19/18 1639 01/25/18 1437   01/19/18 1700  aztreonam (AZACTAM) 1 g in sodium chloride 0.9 % 100 mL IVPB     1 g 200 mL/hr over 30 Minutes Intravenous Every 8 hours 01/19/18 1639     01/19/18 1700  metroNIDAZOLE (FLAGYL) IVPB 500 mg     500 mg 100 mL/hr over 60 Minutes Intravenous Every 8 hours 01/19/18 1639        Subjective: No complaints at this time  Objective: Vitals:   01/28/18 0738 01/28/18 0921 01/28/18 1204 01/28/18 1437  BP: 127/70  (!) 106/93 (!) 100/55  Pulse: 80     Resp: 12  14 17   Temp: 98 F (36.7 C)  98.2 F (36.8 C) 98 F (36.7 C)  TempSrc: Oral  Oral Oral  SpO2:  100% 98% 98%  Weight:      Height:        Intake/Output Summary (Last 24 hours) at 01/28/2018 1503 Last data filed at 01/28/2018 1443 Gross per 24 hour  Intake 500 ml  Output 850 ml  Net -350 ml   Filed Weights   01/26/18 1413 01/27/18 0500 01/28/18 0500  Weight: (!) 139.8 kg (308 lb 3.3 oz) (!) 138.8 kg (306 lb) (!) 143.9 kg (317 lb 3.9 oz)    Examination: General exam: Awake, laying in bed, in nad Respiratory system: Normal respiratory effort, no wheezing Cardiovascular system: regular rate, s1, s2  Data Reviewed: I have personally reviewed following labs and imaging studies  CBC: Recent Labs  Lab 01/23/18 0932 01/24/18 0430 01/26/18 0240 01/27/18 0245 01/28/18 0223  WBC 23.1* 19.6* 18.9* 18.9* 13.3*  HGB 10.6* 10.1* 9.7* 9.3* 9.8*  HCT 34.9* 33.3* 31.5* 31.2* 33.2*  MCV 87.5 87.4 86.8 88.4 89.5  PLT 227 243 274 288 160   Basic Metabolic Panel: Recent Labs  Lab 01/24/18 0430 01/25/18 0428 01/26/18 0240 01/27/18 0245 01/28/18 0223  NA 140 140 139 139 142  K 4.7 4.6 4.4 5.2* 4.4    CL 108 104 104 105 107  CO2 24 27 26 25 28   GLUCOSE 214* 193* 124* 220* 80  BUN 41* 39* 27* 24* 21*  CREATININE 1.23* 1.06* 0.85 0.98 0.89  CALCIUM 9.2 8.7* 8.3* 8.2* 8.5*   GFR: Estimated Creatinine Clearance:  109.1 mL/min (by C-G formula based on SCr of 0.89 mg/dL). Liver Function Tests: Recent Labs  Lab 01/22/18 0341  AST 25  ALT 28  ALKPHOS 167*  BILITOT 1.1  PROT 7.2  ALBUMIN 2.4*   No results for input(s): LIPASE, AMYLASE in the last 168 hours. No results for input(s): AMMONIA in the last 168 hours. Coagulation Profile: No results for input(s): INR, PROTIME in the last 168 hours. Cardiac Enzymes: No results for input(s): CKTOTAL, CKMB, CKMBINDEX, TROPONINI in the last 168 hours. BNP (last 3 results) No results for input(s): PROBNP in the last 8760 hours. HbA1C: No results for input(s): HGBA1C in the last 72 hours. CBG: Recent Labs  Lab 01/27/18 1720 01/27/18 2104 01/28/18 0833 01/28/18 1200 01/28/18 1253  GLUCAP 252* 176* 73 63* 108*   Lipid Profile: No results for input(s): CHOL, HDL, LDLCALC, TRIG, CHOLHDL, LDLDIRECT in the last 72 hours. Thyroid Function Tests: No results for input(s): TSH, T4TOTAL, FREET4, T3FREE, THYROIDAB in the last 72 hours. Anemia Panel: No results for input(s): VITAMINB12, FOLATE, FERRITIN, TIBC, IRON, RETICCTPCT in the last 72 hours. Sepsis Labs: No results for input(s): PROCALCITON, LATICACIDVEN in the last 168 hours.  Recent Results (from the past 240 hour(s))  Blood Culture (routine x 2)     Status: None   Collection Time: 01/19/18  4:26 PM  Result Value Ref Range Status   Specimen Description BLOOD SITE NOT SPECIFIED  Final   Special Requests   Final    BOTTLES DRAWN AEROBIC AND ANAEROBIC Blood Culture adequate volume   Culture   Final    NO GROWTH 5 DAYS Performed at [facility], [address], [place], [place] [address]    Report Status 01/24/2018 FINAL  Final  Blood Culture (routine x 2)     Status: None    Collection Time: 01/19/18  4:31 PM  Result Value Ref Range Status   Specimen Description BLOOD BLOOD RIGHT FOREARM  Final   Special Requests   Final    BOTTLES DRAWN AEROBIC AND ANAEROBIC Blood Culture adequate volume   Culture   Final    NO GROWTH 5 DAYS Performed at [facility], [place] [address], [place], [place] [address]    Report Status 01/24/2018 FINAL  Final  Surgical pcr screen     Status: None   Collection Time: 01/22/18  1:27 PM  Result Value Ref Range Status   MRSA, PCR NEGATIVE NEGATIVE Final   Staphylococcus aureus NEGATIVE NEGATIVE Final    Comment: (NOTE) The Xpert SA Assay (FDA approved for NASAL specimens in patients 57 years of age and older), is one component of a comprehensive surveillance program. It is not intended to diagnose infection nor to guide or monitor treatment. Performed at [facility], [place] [address], [place], [place] [address]   Aerobic/Anaerobic Culture (surgical/deep wound)     Status: None   Collection Time: 01/22/18  6:49 PM  Result Value Ref Range Status   Specimen Description ABSCESS LABIA  Final   Special Requests PATIENT ON FOLLOWING VANC FLAGYL AZTREONAM  Final   Gram Stain   Final    MODERATE WBC PRESENT, PREDOMINANTLY PMN ABUNDANT GRAM NEGATIVE RODS ABUNDANT GRAM POSITIVE COCCI    Culture   Final    RARE ENTEROCOCCUS CASSELIFLAVUS FEW STREPTOCOCCUS GROUP C NO ANAEROBES ISOLATED Performed at [facility], [place] [address], [place], [place] [address]    Report Status 01/27/2018 FINAL  Final   Organism ID,  Bacteria ENTEROCOCCUS CASSELIFLAVUS  Final      Susceptibility   Enterococcus casseliflavus - MIC*    AMPICILLIN <=2 SENSITIVE Sensitive     VANCOMYCIN RESISTANT Resistant     GENTAMICIN SYNERGY SENSITIVE Sensitive     LINEZOLID 2 SENSITIVE Sensitive     * RARE ENTEROCOCCUS CASSELIFLAVUS  Culture, Urine     Status: Abnormal   Collection Time: 01/22/18  8:21 PM  Result Value Ref Range Status   Specimen  Description URINE, CLEAN CATCH  Final   Special Requests   Final    NONE Performed at [facility], [place] [address], [place], [place] [address]    Culture MULTIPLE SPECIES PRESENT, SUGGEST RECOLLECTION (A)  Final   Report Status 01/25/2018 FINAL  Final     Radiology Studies: No results found.  Scheduled Meds: . acetaminophen  650 mg Oral Q6H  . busPIRone  30 mg Oral BID  . famotidine  20 mg Oral Daily  . heparin  5,000 Units Subcutaneous Q8H  . insulin regular human CONCENTRATED  170-370 Units Subcutaneous BID WC  . mometasone-formoterol  2 puff Inhalation BID  . pregabalin  300 mg Oral BID  . traZODone  150 mg Oral QHS   Continuous Infusions: . sodium chloride 50 mL/hr at 01/28/18 0700  . aztreonam 1 g (01/28/18 0933)  . linezolid (ZYVOX) IV 600 mg (01/28/18 1225)  . metronidazole 500 mg (01/28/18 1044)     LOS: 9 days   Marylu Lund, MD [facility] Pager [phone]  If 7PM-7AM, please contact night-coverage [email or web address] Password [facility] 01/28/2018, 3:03 PM

## 2018-01-28 NOTE — Progress Notes (Signed)
Hypoglycemic Event  CBG: 63  Treatment: 15 GM carbohydrate snack  Symptoms: None  Follow-up CBG: Time:1253 CBG Result:108  Possible Reasons for Event: Unknown  Comments/MD notified:patient was asymptomatic and cbg with in normal limits with minimal treatment.will continue to monitor.    Irish Elders

## 2018-01-29 ENCOUNTER — Ambulatory Visit: Payer: BLUE CROSS/BLUE SHIELD | Admitting: Pain Medicine

## 2018-01-29 ENCOUNTER — Inpatient Hospital Stay (HOSPITAL_COMMUNITY): Payer: BLUE CROSS/BLUE SHIELD | Admitting: Anesthesiology

## 2018-01-29 ENCOUNTER — Encounter (HOSPITAL_COMMUNITY)
Admission: EM | Disposition: A | Discharge status: Nursing Home (Includes ICF) | Payer: Self-pay | Source: Home / Self Care | Attending: Internal Medicine

## 2018-01-29 ENCOUNTER — Encounter (HOSPITAL_COMMUNITY): Payer: Self-pay | Admitting: Certified Registered"

## 2018-01-29 DIAGNOSIS — G40909 Epilepsy, unspecified, not intractable, without status epilepticus: Secondary | ICD-10-CM

## 2018-01-29 DIAGNOSIS — R700 Elevated erythrocyte sedimentation rate: Secondary | ICD-10-CM

## 2018-01-29 DIAGNOSIS — R7982 Elevated C-reactive protein (CRP): Secondary | ICD-10-CM | POA: Insufficient documentation

## 2018-01-29 DIAGNOSIS — E559 Vitamin D deficiency, unspecified: Secondary | ICD-10-CM | POA: Insufficient documentation

## 2018-01-29 DIAGNOSIS — L8990 Pressure ulcer of unspecified site, unspecified stage: Secondary | ICD-10-CM

## 2018-01-29 DIAGNOSIS — R6520 Severe sepsis without septic shock: Secondary | ICD-10-CM

## 2018-01-29 HISTORY — DX: Elevated C-reactive protein (CRP): R79.82

## 2018-01-29 HISTORY — PX: INCISION AND DRAINAGE PERIRECTAL ABSCESS: SHX1804

## 2018-01-29 HISTORY — DX: Elevated erythrocyte sedimentation rate: R70.0

## 2018-01-29 LAB — CBC
HCT: 31.3 % — ABNORMAL LOW (ref 36.0–46.0)
Hemoglobin: 9.4 g/dL — ABNORMAL LOW (ref 12.0–15.0)
MCH: 26.8 pg (ref 26.0–34.0)
MCHC: 30 g/dL (ref 30.0–36.0)
MCV: 89.2 fL (ref 78.0–100.0)
Platelets: 312 10*3/uL (ref 150–400)
RBC: 3.51 MIL/uL — ABNORMAL LOW (ref 3.87–5.11)
RDW: 18.6 % — ABNORMAL HIGH (ref 11.5–15.5)
WBC: 13.4 10*3/uL — ABNORMAL HIGH (ref 4.0–10.5)

## 2018-01-29 LAB — BASIC METABOLIC PANEL
Anion gap: 7 (ref 5–15)
BUN: 15 mg/dL (ref 6–20)
CO2: 27 mmol/L (ref 22–32)
Calcium: 8.1 mg/dL — ABNORMAL LOW (ref 8.9–10.3)
Chloride: 105 mmol/L (ref 98–111)
Creatinine, Ser: 0.8 mg/dL (ref 0.44–1.00)
GFR calc Af Amer: >60 mL/min (ref >60)
GFR calc non Af Amer: >60 mL/min (ref >60)
Glucose, Bld: 206 mg/dL — ABNORMAL HIGH (ref 70–99)
Potassium: 5 mmol/L (ref 3.5–5.1)
Sodium: 139 mmol/L (ref 135–145)

## 2018-01-29 LAB — GLUCOSE, CAPILLARY
Glucose-Capillary: 233 mg/dL — ABNORMAL HIGH (ref 70–99)
Glucose-Capillary: 198 mg/dL — ABNORMAL HIGH (ref 70–99)
Glucose-Capillary: 149 mg/dL — ABNORMAL HIGH (ref 70–99)
Glucose-Capillary: 148 mg/dL — ABNORMAL HIGH (ref 70–99)
Glucose-Capillary: 118 mg/dL — ABNORMAL HIGH (ref 70–99)

## 2018-01-29 SURGERY — INCISION AND DRAINAGE, ABSCESS, PERIRECTAL
Anesthesia: General | Site: Vagina

## 2018-01-29 MED ORDER — PHENYLEPHRINE 40 MCG/ML (10ML) SYRINGE FOR IV PUSH (FOR BLOOD PRESSURE SUPPORT)
PREFILLED_SYRINGE | INTRAVENOUS | Status: DC | PRN
  Administered 2018-01-29: 80 ug via INTRAVENOUS

## 2018-01-29 MED ORDER — MEPERIDINE HCL 50 MG/ML IJ SOLN: 6.2500 mg | INTRAMUSCULAR | Status: DC | PRN

## 2018-01-29 MED ORDER — PROPOFOL 10 MG/ML IV BOLUS
INTRAVENOUS | Status: DC | PRN
  Administered 2018-01-29: 30 mg via INTRAVENOUS
  Administered 2018-01-29: 200 mg via INTRAVENOUS

## 2018-01-29 MED ORDER — ACETAMINOPHEN 10 MG/ML IV SOLN: 1000.0000 mg | Freq: Once | INTRAVENOUS | Status: DC | PRN

## 2018-01-29 MED ORDER — PHENYLEPHRINE 40 MCG/ML (10ML) SYRINGE FOR IV PUSH (FOR BLOOD PRESSURE SUPPORT)
PREFILLED_SYRINGE | INTRAVENOUS | Status: AC
  Filled 2018-01-29: qty 10

## 2018-01-29 MED ORDER — FENTANYL CITRATE (PF) 250 MCG/5ML IJ SOLN
INTRAMUSCULAR | Status: AC
  Filled 2018-01-29: qty 5

## 2018-01-29 MED ORDER — GERHARDT'S BUTT CREAM
TOPICAL_CREAM | CUTANEOUS | Status: DC | PRN
  Filled 2018-01-29 (×3): qty 1

## 2018-01-29 MED ORDER — FENTANYL CITRATE (PF) 100 MCG/2ML IJ SOLN
INTRAMUSCULAR | Status: DC | PRN
  Administered 2018-01-29 (×4): 50 ug via INTRAVENOUS

## 2018-01-29 MED ORDER — LIDOCAINE 2% (20 MG/ML) 5 ML SYRINGE
INTRAMUSCULAR | Status: AC
  Filled 2018-01-29: qty 5

## 2018-01-29 MED ORDER — LIDOCAINE 2% (20 MG/ML) 5 ML SYRINGE
INTRAMUSCULAR | Status: DC | PRN
  Administered 2018-01-29: 100 mg via INTRAVENOUS

## 2018-01-29 MED ORDER — ONDANSETRON HCL 4 MG/2ML IJ SOLN
INTRAMUSCULAR | Status: AC
Start: 2018-01-29 — End: ?
  Filled 2018-01-29: qty 2

## 2018-01-29 MED ORDER — HYDROMORPHONE HCL 1 MG/ML IJ SOLN
0.5000 mg | INTRAMUSCULAR | Status: DC | PRN
  Administered 2018-02-01 – 2018-02-05 (×4): 0.5 mg via INTRAVENOUS
  Filled 2018-01-29 (×4): qty 0.5

## 2018-01-29 MED ORDER — LACTATED RINGERS IV SOLN
INTRAVENOUS | Status: DC
  Administered 2018-01-29: 12:00:00 via INTRAVENOUS

## 2018-01-29 MED ORDER — HYDROCODONE-ACETAMINOPHEN 7.5-325 MG PO TABS: 1.0000 | ORAL_TABLET | Freq: Once | ORAL | Status: DC | PRN

## 2018-01-29 MED ORDER — SUCCINYLCHOLINE CHLORIDE 200 MG/10ML IV SOSY
PREFILLED_SYRINGE | INTRAVENOUS | Status: AC
  Filled 2018-01-29: qty 10

## 2018-01-29 MED ORDER — ONDANSETRON HCL 4 MG/2ML IJ SOLN
INTRAMUSCULAR | Status: DC | PRN
  Administered 2018-01-29: 4 mg via INTRAVENOUS

## 2018-01-29 MED ORDER — ONDANSETRON HCL 4 MG/2ML IJ SOLN: 4.0000 mg | Freq: Once | INTRAMUSCULAR | Status: DC | PRN

## 2018-01-29 MED ORDER — SUGAMMADEX SODIUM 200 MG/2ML IV SOLN
INTRAVENOUS | Status: DC | PRN
  Administered 2018-01-29: 500 mg via INTRAVENOUS

## 2018-01-29 MED ORDER — ROCURONIUM BROMIDE 100 MG/10ML IV SOLN
INTRAVENOUS | Status: DC | PRN
  Administered 2018-01-29: 30 mg via INTRAVENOUS

## 2018-01-29 MED ORDER — PROPOFOL 10 MG/ML IV BOLUS
INTRAVENOUS | Status: AC
  Filled 2018-01-29: qty 20

## 2018-01-29 MED ORDER — HYDROMORPHONE HCL 1 MG/ML IJ SOLN: 0.2500 mg | INTRAMUSCULAR | Status: DC | PRN

## 2018-01-29 MED ORDER — 0.9 % SODIUM CHLORIDE (POUR BTL) OPTIME
TOPICAL | Status: DC | PRN
  Administered 2018-01-29: 1000 mL

## 2018-01-29 MED ORDER — SUGAMMADEX SODIUM 200 MG/2ML IV SOLN
INTRAVENOUS | Status: AC
  Filled 2018-01-29: qty 2

## 2018-01-29 MED ORDER — SUCCINYLCHOLINE CHLORIDE 200 MG/10ML IV SOSY
PREFILLED_SYRINGE | INTRAVENOUS | Status: DC | PRN
  Administered 2018-01-29: 160 mg via INTRAVENOUS

## 2018-01-29 SURGICAL SUPPLY — 33 items
BLADE SURG 10 STRL SS (BLADE) ×2 IMPLANT
CANISTER SUCT 3000ML PPV (MISCELLANEOUS) ×2 IMPLANT
CLEANER TIP ELECTROSURG 2X2 (MISCELLANEOUS) IMPLANT
COVER SURGICAL LIGHT HANDLE (MISCELLANEOUS) ×2 IMPLANT
DRAPE UTILITY XL STRL (DRAPES) ×4 IMPLANT
DRSG PAD ABDOMINAL 8X10 ST (GAUZE/BANDAGES/DRESSINGS) ×2 IMPLANT
ELECT REM PT RETURN 9FT ADLT (ELECTROSURGICAL)
ELECTRODE REM PT RTRN 9FT ADLT (ELECTROSURGICAL) IMPLANT
GAUZE PACKING IODOFORM 1 (PACKING) IMPLANT
GAUZE SPONGE 4X4 12PLY STRL (GAUZE/BANDAGES/DRESSINGS) ×2 IMPLANT
GAUZE SPONGE 4X4 16PLY XRAY LF (GAUZE/BANDAGES/DRESSINGS) ×2 IMPLANT
GLOVE BIOGEL PI IND STRL 8 (GLOVE) ×1 IMPLANT
GLOVE BIOGEL PI INDICATOR 8 (GLOVE) ×1
GLOVE ECLIPSE 7.5 STRL STRAW (GLOVE) ×2 IMPLANT
GOWN STRL REUS W/ TWL LRG LVL3 (GOWN DISPOSABLE) ×1 IMPLANT
GOWN STRL REUS W/ TWL XL LVL3 (GOWN DISPOSABLE) ×1 IMPLANT
GOWN STRL REUS W/TWL LRG LVL3 (GOWN DISPOSABLE) ×1
GOWN STRL REUS W/TWL XL LVL3 (GOWN DISPOSABLE) ×1
KIT BASIN OR (CUSTOM PROCEDURE TRAY) ×2 IMPLANT
KIT TURNOVER KIT B (KITS) ×2 IMPLANT
NS IRRIG 1000ML POUR BTL (IV SOLUTION) ×2 IMPLANT
PACK LITHOTOMY IV (CUSTOM PROCEDURE TRAY) ×2 IMPLANT
PAD ARMBOARD 7.5X6 YLW CONV (MISCELLANEOUS) ×4 IMPLANT
PENCIL BUTTON HOLSTER BLD 10FT (ELECTRODE) IMPLANT
SUT VIC AB 3-0 SH 18 (SUTURE) ×2 IMPLANT
SWAB COLLECTION DEVICE MRSA (MISCELLANEOUS) ×2 IMPLANT
SWAB CULTURE ESWAB REG 1ML (MISCELLANEOUS) ×2 IMPLANT
SYR BULB IRRIGATION 50ML (SYRINGE) ×2 IMPLANT
TOWEL OR 17X24 6PK STRL BLUE (TOWEL DISPOSABLE) ×2 IMPLANT
TOWEL OR 17X26 10 PK STRL BLUE (TOWEL DISPOSABLE) ×2 IMPLANT
TUBE CONNECTING 12X1/4 (SUCTIONS) ×2 IMPLANT
UNDERPAD 30X30 (UNDERPADS AND DIAPERS) ×2 IMPLANT
YANKAUER SUCT BULB TIP NO VENT (SUCTIONS) ×2 IMPLANT

## 2018-01-29 NOTE — Progress Notes (Signed)
Occupational Therapy Treatment Patient Details Name: [name] MRN: [id number] DOB: 1971-05-23 Today's Date: 01/29/2018    History of present illness Pt is a 47 y.o. female presenting with necortizing soft tissue infection of mons pubis/vulva and secondary sepsis. Pt s/p I&D labial/vulvar area on 7/15 with plans for repeat I&D on 7/19. PMHx: COPD, Morbid obesity, COPD, Seizure disorider, Bipolar disorider, Sleep apnea, Essential HTN, Diabetes, IBS, CKD.   OT comments  Pt progressing towards established OT goals and performing bed mobility with Max A +2-3 to sit at EOB with Max A for balance demonstrating posterior lean. Pt attempting sit<>stand x3 with Total A; unable to achieve standing. Pt requiring Total A for peri care after urinary incontinence in bed and requiring Max A +2 for rolling R<>L. Continues to recommend dc to SNF and will continue to follow acutely as admitted.    Follow Up Recommendations  SNF;Supervision/Assistance - 24 hour    Equipment Recommendations  3 in 1 bedside commode    Recommendations for Other Services      Precautions / Restrictions Precautions Precautions: Fall Restrictions Weight Bearing Restrictions: No       Mobility Bed Mobility Overal bed mobility: Needs Assistance Bed Mobility: Rolling;Supine to Sit;Sit to Supine Rolling: Max assist;+2 for physical assistance   Supine to sit: Max assist;+2 for physical assistance Sit to supine: Max assist;+2 for physical assistance(+3)   General bed mobility comments: Max A +2 for rolling R<>L for toilet hygiene. Pt requiring Max A to bring BLEs towards EOB and then elevate trunk. Cues for pushing into upright posture with UEs. Pt demosntrating posterior lean while sitting EOB and required Max cues to attempt and correct posture.   Transfers Overall transfer level: Needs assistance Equipment used: 2 person hand held assist Transfers: Sit to/from Stand Sit to Stand: Total assist;+2 physical  assistance         General transfer comment: Attempted sit<>stand three times (twice with 2 person and once with stedy). Pt unable to achieve standing and presenting with posterior lean.    Balance Overall balance assessment: History of Falls                                         ADL either performed or assessed with clinical judgement   ADL Overall ADL's : Needs assistance/impaired Eating/Feeding: NPO                           Toileting- Clothing Manipulation and Hygiene: Total assistance;+2 for physical assistance;Bed level Toileting - Clothing Manipulation Details (indicate cue type and reason): Total assist +3 for bed level peri care following urinary incontinence     Functional mobility during ADLs: Maximal assistance;+2 for physical assistance(no DME and with bari stedy; attempt sit<>stand x3) General ADL Comments: Pt performing bed mobility and sitting at EOB with Max A for posterior lean. Pt with decreased problem solving and posterior lean; unable to correct posture. Pt attempting sit<>stand three times at EOB. Return to supine and rolling with Max A +2 for peri care at bed level.      Vision       Perception     Praxis      Cognition Arousal/Alertness: Awake/alert Behavior During Therapy: WFL for tasks assessed/performed Overall Cognitive Status: Impaired/Different from baseline Area of Impairment: Following commands;Safety/judgement;Awareness;Problem solving;Orientation  Orientation Level: Disoriented to;Place;Time;Situation     Following Commands: Follows one step commands with increased time;Follows one step commands inconsistently Safety/Judgement: Decreased awareness of safety;Decreased awareness of deficits Awareness: Intellectual Problem Solving: Slow processing;Decreased initiation;Difficulty sequencing;Requires verbal cues;Requires tactile cues General Comments: Pt able to recall she has surgery  today, but not oriented to hospital stating she is at "huts". Pt requiring increased cues and time throughout session. Motivated and agreeable to participate in therapy stating "I got to do all I can"        Exercises     Shoulder Instructions       General Comments VSS throughout. husband asleep in recliner throughout. RN present for dressing change of wound during peri care.    Pertinent Vitals/ Pain       Pain Assessment: Faces Faces Pain Scale: Hurts even more Pain Location: genitals Pain Descriptors / Indicators: Grimacing;Sharp Pain Intervention(s): Monitored during session;Limited activity within patient's tolerance;Repositioned  Home Living Family/patient expects to be discharged to:: Private residence Living Arrangements: Spouse/significant other Available Help at Discharge: Family;Available 24 hours/day Type of Home: House Home Access: Stairs to enter [employer] of Steps: 3   Home Layout: One level     Bathroom Shower/Tub: Tub/shower unit;Curtain   Biochemist, clinical: Standard     Home Equipment: Environmental consultant - 2 wheels;Walker - 4 wheels;Tub bench;Grab bars - tub/shower   Additional Comments: has been mainly having a family member hold her R shoulder and assist her that way at home      Prior Functioning/Environment Level of Independence: Needs assistance  Gait / Transfers Assistance Needed: Rollator for mobility ADL's / Homemaking Assistance Needed: Husband reports pt requires occasional light assist with BADL.   Comments: pt feels her rollator is not that helpful   Frequency  Min 2X/week        Progress Toward Goals  OT Goals(current goals can now be found in the care plan section)  Progress towards OT goals: Progressing toward goals  Acute Rehab OT Goals Patient Stated Goal: return home OT Goal Formulation: With patient/family Time For Goal Achievement: 02/09/18 Potential to Achieve Goals: Fair ADL Goals Pt/caregiver will Perform Home  Exercise Program: Increased strength;Both right and left upper extremity;With theraband(bed level theraband ex) Additional ADL Goal #1: Pt will perform bed mobility with mod assist as precursor to ADL. Additional ADL Goal #2: Pt will tolerate sitting EOB x5 minutes with min guard assist during ADL.  Plan Discharge plan remains appropriate    Co-evaluation    PT/OT/SLP Co-Evaluation/Treatment: Yes Reason for Co-Treatment: Complexity of the patient's impairments (multi-system involvement);For patient/therapist safety;To address functional/ADL transfers   OT goals addressed during session: ADL's and self-care      AM-PAC PT "6 Clicks" Daily Activity     Outcome Measure   Help from another person eating meals?: Total Help from another person taking care of personal grooming?: A Little Help from another person toileting, which includes using toliet, bedpan, or urinal?: Total Help from another person bathing (including washing, rinsing, drying)?: Total Help from another person to put on and taking off regular upper body clothing?: A Lot Help from another person to put on and taking off regular lower body clothing?: Total 6 Click Score: 9    End of Session Equipment Utilized During Treatment: Oxygen  OT Visit Diagnosis: Unsteadiness on feet (R26.81);Muscle weakness (generalized) (M62.81);History of falling (Z91.81);Pain Pain - part of body: (genitals)   Activity Tolerance Patient tolerated treatment well;Patient limited by pain   Patient Left in  bed;with call bell/phone within reach;with family/visitor present   Nurse Communication Mobility status        Time: 6440-3474 OT Time Calculation (min): 26 min  Charges: OT General Charges $OT Visit: 1 Visit OT Treatments $Self Care/Home Management : 8-22 mins  [place], OTR/L Acute Rehab Pager: [phone] Office: [place] 01/29/2018, 9:42 AM

## 2018-01-29 NOTE — Plan of Care (Signed)
Continue current care plan 

## 2018-01-29 NOTE — Consult Note (Addendum)
   [facility] CM Inpatient Consult   01/29/2018  [name] 1971-01-27 [id number]   Received call from [facility] with [facility] indicating patient is active with [facility] ( home based outpatient palliative care program administered by [facility]).  Notification sent to inpatient RNCM to make aware of above notes.   Marthenia Rolling, MSN-Ed, RN,BSN [facility] Liaison [phone]

## 2018-01-29 NOTE — Transfer of Care (Signed)
Immediate Anesthesia Transfer of Care Note  Patient: [name]  Procedure(s) Performed: IRRIGATION AND DEBRIDEMENT VULVA (N/A Vagina )  Patient Location: PACU  Anesthesia Type:General  Level of Consciousness: drowsy  Airway & Oxygen Therapy: Patient Spontanous Breathing and Patient connected to face mask oxygen  Post-op Assessment: Report given to RN and Post -op Vital signs reviewed and stable  Post vital signs: Reviewed and stable  Last Vitals:  Vitals Value Taken Time  BP 125/59 01/29/2018  2:09 PM  Temp    Pulse 100 01/29/2018  2:13 PM  Resp 13 01/29/2018  2:13 PM  SpO2 96 % 01/29/2018  2:13 PM  Vitals shown include unvalidated device data.  Last Pain:  Vitals:   01/29/18 1049  TempSrc: Oral  PainSc: 0-No pain      Patients Stated Pain Goal: 3 (18/59/09 3112)  Complications: No apparent anesthesia complications

## 2018-01-29 NOTE — Progress Notes (Signed)
Patient refused CPAP. Encouraged patient and husband to call for Respiratory if CPAP desired.

## 2018-01-29 NOTE — Anesthesia Procedure Notes (Signed)
Procedure Name: Intubation Date/Time: 01/29/2018 12:56 PM Performed by: Gwyndolyn Saxon, CRNA Pre-anesthesia Checklist: Patient identified, Emergency Drugs available, Suction available, Patient being monitored and Timeout performed Patient Re-evaluated:Patient Re-evaluated prior to induction Oxygen Delivery Method: Circle system utilized Preoxygenation: Pre-oxygenation with 100% oxygen Induction Type: IV induction Ventilation: Mask ventilation without difficulty and Oral airway inserted - appropriate to patient size Laryngoscope Size: Sabra Heck and 2 Grade View: Grade I Tube type: Oral Tube size: 7.0 mm Number of attempts: 1 Airway Equipment and Method: Patient positioned with wedge pillow Placement Confirmation: ETT inserted through vocal cords under direct vision,  positive ETCO2,  CO2 detector and breath sounds checked- equal and bilateral Secured at: 20 cm Tube secured with: Tape Dental Injury: Teeth and Oropharynx as per pre-operative assessment

## 2018-01-29 NOTE — Progress Notes (Signed)
Physical Therapy Treatment Patient Details Name: [name] MRN: [id number] DOB: June 20, 1971 Today's Date: 01/29/2018    History of Present Illness Pt is a 47 y.o. female presenting with necrotizing soft tissue infection of mons pubis/vulva and secondary sepsis with encephalopathy. Pt s/p I&D labial/vulvar area on 7/15, 7/19, 7/22. PMHx: COPD, Morbid obesity, COPD, Seizure disorider, Bipolar disorider, Sleep apnea, Essential HTN, Diabetes, IBS, CKD.    PT Comments    Pt pleasant, confused, disoriented and talking about bear named "Roxy". Pt slow to process commands and questions with max +2 for all attempts at transfers with +3 needed for attempts OOB. Pt with purewick not to suction on arrival with linens wet and assist for linen change with RN present end of session. Pt able to increase mobility to EOB and attempt to stand today. Will continue to follow.     Follow Up Recommendations  SNF;Supervision/Assistance - 24 hour     Equipment Recommendations  Other (comment)(TBD)    Recommendations for Other Services       Precautions / Restrictions Precautions Precautions: Fall Restrictions Weight Bearing Restrictions: No    Mobility  Bed Mobility Overal bed mobility: Needs Assistance Bed Mobility: Rolling;Supine to Sit;Sit to Supine Rolling: Max assist;+2 for physical assistance   Supine to sit: Max assist;+2 for physical assistance Sit to supine: Max assist;+2 for physical assistance   General bed mobility comments: Max A +2 for rolling R<>L for toilet hygiene. Pt requiring Max A to bring BLEs towards EOB and then elevate trunk. Cues for pushing into upright posture with UEs. Pt with posterior lean while sitting EOB and required Max cues and assist to achieve midline  Transfers Overall transfer level: Needs assistance Equipment used: 2 person hand held assist Transfers: Sit to/from Stand Sit to Stand: Max assist;+2 physical assistance         General transfer comment:  Attempted sit<>stand three times (twice with 2 person and once with stedy). Pt unable to achieve standing and presenting with posterior lean. Pt able to assist with anterior lean and get weight on feet with physical assist but could not clear sacrum. With stedy pt able to eventually grasp bar but could not utilize for anterior translation and rise  Ambulation/Gait             General Gait Details: unable   Stairs             Wheelchair Mobility    Modified Rankin (Stroke Patients Only)       Balance Overall balance assessment: Needs assistance Sitting-balance support: Feet supported;Bilateral upper extremity supported Sitting balance-Leahy Scale: Zero Sitting balance - Comments: max assist for anterior translation Postural control: Posterior lean   Standing balance-Leahy Scale: Zero Standing balance comment: bil UE support with max assist for anterior translation to get weigth on feet                            Cognition Arousal/Alertness: Awake/alert Behavior During Therapy: Flat affect Overall Cognitive Status: Impaired/Different from baseline Area of Impairment: Following commands;Safety/judgement;Awareness;Problem solving;Orientation;Attention;Memory                 Orientation Level: Disoriented to;Place;Time;Situation Current Attention Level: Focused Memory: Decreased short-term memory Following Commands: Follows one step commands with increased time;Follows one step commands inconsistently Safety/Judgement: Decreased awareness of safety;Decreased awareness of deficits Awareness: Intellectual Problem Solving: Slow processing;Decreased initiation;Difficulty sequencing;Requires verbal cues;Requires tactile cues General Comments: Pt able to recall she has surgery today,  but not oriented to hospital stating she is at "huts". Pt requiring increased cues and time throughout session. Motivated and agreeable to participate in therapy stating "I got  to do all I can"      Exercises      General Comments General comments (skin integrity, edema, etc.): VSS throughout. husband asleep in recliner throughout. RN present for dressing change of wound during peri care.      Pertinent Vitals/Pain Pain Assessment: Faces Faces Pain Scale: Hurts even more Pain Location: groin Pain Descriptors / Indicators: Grimacing;Aching Pain Intervention(s): Limited activity within patient's tolerance;Repositioned;Monitored during session    Home Living Family/patient expects to be discharged to:: Private residence Living Arrangements: Spouse/significant other Available Help at Discharge: Family;Available 24 hours/day Type of Home: House Home Access: Stairs to enter   Home Layout: One level Home Equipment: Environmental consultant - 2 wheels;Walker - 4 wheels;Tub bench;Grab bars - tub/shower Additional Comments: has been mainly having a family member hold her R shoulder and assist her that way at home    Prior Function Level of Independence: Needs assistance  Gait / Transfers Assistance Needed: Rollator for mobility ADL's / Homemaking Assistance Needed: Husband reports pt requires occasional light assist with BADL. Comments: pt feels her rollator is not that helpful   PT Goals (current goals can now be found in the care plan section) Acute Rehab PT Goals Patient Stated Goal: return home Progress towards PT goals: Progressing toward goals    Frequency           PT Plan Current plan remains appropriate    Co-evaluation PT/OT/SLP Co-Evaluation/Treatment: Yes Reason for Co-Treatment: Complexity of the patient's impairments (multi-system involvement);For patient/therapist safety;To address functional/ADL transfers   OT goals addressed during session: ADL's and self-care      AM-PAC PT "6 Clicks" Daily Activity  Outcome Measure  Difficulty turning over in bed (including adjusting bedclothes, sheets and blankets)?: Unable Difficulty moving from lying on  back to sitting on the side of the bed? : Unable Difficulty sitting down on and standing up from a chair with arms (e.g., wheelchair, bedside commode, etc,.)?: Unable Help needed moving to and from a bed to chair (including a wheelchair)?: Total Help needed walking in hospital room?: Total Help needed climbing 3-5 steps with a railing? : Total 6 Click Score: 6    End of Session Equipment Utilized During Treatment: Oxygen Activity Tolerance: Patient tolerated treatment well Patient left: in bed;with call bell/phone within reach;with family/visitor present;with nursing/sitter in room Nurse Communication: Mobility status;Need for lift equipment PT Visit Diagnosis: Unsteadiness on feet (R26.81);Other abnormalities of gait and mobility (R26.89);Muscle weakness (generalized) (M62.81);History of falling (Z91.81)     Time: 6378-5885 PT Time Calculation (min) (ACUTE ONLY): 27 min  Charges:  $Therapeutic Activity: 8-22 mins                    G Codes:       Elwyn Reach, PT [phone]    Korryn Pancoast B Undra Harriman 01/29/2018, 11:10 AM

## 2018-01-29 NOTE — Progress Notes (Deleted)
No show

## 2018-01-29 NOTE — Progress Notes (Signed)
PROGRESS NOTE    [name]  YTK:[id number] DOB: Feb 27, 1971 DOA: 01/19/2018 PCP: Pleas Koch, NP    Brief Narrative:  47 y.o.femalewith medical history significant for but not limited to poorly controlled resistant t2dm, morbid obesity, copd with possible superimposed obesity hypoventilation syndrome, on chronic O 2 (2L during the day, 4 at night), osa on nightly cpcp, htn, dCHF, and CAD  Presenting with 4 day history of vulvar pain,  And limited for uncontrolled diabetes with mons pubis cellulitis and secondary sepsis.  She was given 2 L of normal saline and antibiotics initiated at the ED prior to her admission   Assessment & Plan:   Principal Problem:   Necrotizing soft tissue infection Active Problems:   Morbid (severe) obesity due to excess calories (HCC)   Tobacco abuse   COPD GOLD 0    Seizure disorder (HCC)   Bipolar disorder (HCC)   Sleep apnea   Essential hypertension   Diabetes (HCC)   IBS (irritable bowel syndrome)   Sepsis (HCC)   Elevated serum hCG   Open wound of genital labia   Vulvar abscess   Poorly controlled type 2 diabetes mellitus ([place])   DNR (do not resuscitate)   CKD (chronic kidney disease), stage III (HCC)   Pressure injury of skin   1. Necrotizing soft tissue infection of Mons/ vulva- went to OR 7/15 - SP I&D labial/ vulvar area 01/22/18 by Gen Surgery - pcn allergic, Patient had been continued on  IV aztreonam / metronidazole/vancomycin - Patient was seen by palliative care 7/15, patient wants the following:             - DNR             - No trach             - Does not want to be re-intubated.              - No feeding tubes             - Not keen on dialysis; not a good long term candidateregardless given      co-morbidities and overall weakness - On baseline O2 at present - Wound culture growing VRE -Had discussed with ID. D/c vanc and started linezolid on 7/18. Stopped SSRI. -Discussed with General surgery. Wounds recently  noted to be more purulent and patient underwent surgical debridement 7/19 with extensive debridement performed -Patient currently in OR to undergo continued surgical debridement as of 7/22.  -Will continue patient on current abx   2. Uncontrolled type 2 diabetes mellitus: - metformin presently remains on hold - currently on high dose BID with SSI coverage -Glucose trends reviewed. Stable -U500 has been started. Blood glucose improved   3. Acute on chronic kidney injury:  - Cr baseline is 1.4- 1.8 (CKD III). Cr now at baseline  -diuretics on hold (metolazone, torsemide and Aldactone)  - have stopped NSAIDs  -Renal function stable . Labs reviewed this AM  4. elevated HCG:  Reportedly has not had sexual intercourse for past 4-5 years  follow clinically  -Suggest close outpatient follow up   5. COPD / OSA:    - sees pulm in office for COPD gold stage 0 w nocturnal hypoxemia (on noct O2 at 3L as of sept 2018 office visit)  - Supportive care, p.r.n. Albuterol, on symbicort at home  - no wheezing currently -Patient has been refusing CPAP at night -Recent ABG unremarkabe without evidence of hypercarbia -Currently on minimal  O2 support   6.  Chronic diastolic CHF:   - admitted here by cardiology once in 2017 and once late 2018 for dCHF exacerbation - diuretics on hold in view of sepsis - follow daily wts, - appears euvolemic. Stable at this time  7.  DNR:  Seen by palliative care 7/15, see their notes, remains DNR and no re-intubation  DVT prophylaxis: Heparin subq Code Status: DNR Family Communication: Pt in room, family not at bedside Disposition Plan: Uncertain at this time  Consultants:   Palliative care  General Surgery  Procedures:  -SP I&D labial/ vulvar area 01/22/18 by Gen Surgery -Surgical debridement 7/19    Antimicrobials: Anti-infectives (From admission, onward)   Start     Dose/Rate Route Frequency Ordered Stop   01/25/18 1530  [MAR Hold]  linezolid  (ZYVOX) IVPB 600 mg     (MAR Hold since Mon 01/29/2018 at 1102. Reason: Transfer to a Procedural area.)  Note to Pharmacy:  Have stopped fluoxetine   600 mg 300 mL/hr over 60 Minutes Intravenous Every 12 hours 01/25/18 1437     01/19/18 1800  vancomycin (VANCOCIN) 1,500 mg in sodium chloride 0.9 % 500 mL IVPB  Status:  Discontinued     1,500 mg 250 mL/hr over 120 Minutes Intravenous Every 24 hours 01/19/18 1639 01/25/18 1437   01/19/18 1700  [MAR Hold]  aztreonam (AZACTAM) 1 g in sodium chloride 0.9 % 100 mL IVPB     (MAR Hold since Mon 01/29/2018 at 1101. Reason: Transfer to a Procedural area.)   1 g 200 mL/hr over 30 Minutes Intravenous Every 8 hours 01/19/18 1639     01/19/18 1700  [MAR Hold]  metroNIDAZOLE (FLAGYL) IVPB 500 mg     (MAR Hold since Mon 01/29/2018 at 1101. Reason: Transfer to a Procedural area.)   500 mg 100 mL/hr over 60 Minutes Intravenous Every 8 hours 01/19/18 1639        Subjective: Without complaints this AM. Denied sob  Objective: Vitals:   01/29/18 0345 01/29/18 0819 01/29/18 0926 01/29/18 1049  BP: 132/63 119/72  133/72  Pulse:    80  Resp: 19 12  13   Temp: 98.1 F (36.7 C) 98.1 F (36.7 C)  98.2 F (36.8 C)  TempSrc: Oral Oral  Oral  SpO2: 96% 97% 98% 99%  Weight: (!) 143.2 kg (315 lb 11.2 oz)     Height:        Intake/Output Summary (Last 24 hours) at 01/29/2018 1236 Last data filed at 01/29/2018 1000 Gross per 24 hour  Intake 1773.6 ml  Output 600 ml  Net 1173.6 ml   Filed Weights   01/27/18 0500 01/28/18 0500 01/29/18 0345  Weight: (!) 138.8 kg (306 lb) (!) 143.9 kg (317 lb 3.9 oz) (!) 143.2 kg (315 lb 11.2 oz)    Examination: General exam: Conversant, in no acute distress Respiratory system: normal chest rise, clear, no audible wheezing Cardiovascular system: regular rhythm, s1-s2  Data Reviewed: I have personally reviewed following labs and imaging studies  CBC: Recent Labs  Lab 01/24/18 0430 01/26/18 0240 01/27/18 0245  01/28/18 0223 01/29/18 0316  WBC 19.6* 18.9* 18.9* 13.3* 13.4*  HGB 10.1* 9.7* 9.3* 9.8* 9.4*  HCT 33.3* 31.5* 31.2* 33.2* 31.3*  MCV 87.4 86.8 88.4 89.5 89.2  PLT 243 274 288 299 361   Basic Metabolic Panel: Recent Labs  Lab 01/25/18 0428 01/26/18 0240 01/27/18 0245 01/28/18 0223 01/29/18 0316  NA 140 139 139 142 139  K 4.6 4.4  5.2* 4.4 5.0  CL 104 104 105 107 105  CO2 27 26 25 28 27   GLUCOSE 193* 124* 220* 80 206*  BUN 39* 27* 24* 21* 15  CREATININE 1.06* 0.85 0.98 0.89 0.80  CALCIUM 8.7* 8.3* 8.2* 8.5* 8.1*   GFR: Estimated Creatinine Clearance: 121 mL/min (by C-G formula based on SCr of 0.8 mg/dL). Liver Function Tests: No results for input(s): AST, ALT, ALKPHOS, BILITOT, PROT, ALBUMIN in the last 168 hours. No results for input(s): LIPASE, AMYLASE in the last 168 hours. No results for input(s): AMMONIA in the last 168 hours. Coagulation Profile: No results for input(s): INR, PROTIME in the last 168 hours. Cardiac Enzymes: No results for input(s): CKTOTAL, CKMB, CKMBINDEX, TROPONINI in the last 168 hours. BNP (last 3 results) No results for input(s): PROBNP in the last 8760 hours. HbA1C: No results for input(s): HGBA1C in the last 72 hours. CBG: Recent Labs  Lab 01/28/18 1253 01/28/18 1756 01/28/18 2136 01/29/18 0816 01/29/18 1138  GLUCAP 108* 145* 153* 233* 198*   Lipid Profile: No results for input(s): CHOL, HDL, LDLCALC, TRIG, CHOLHDL, LDLDIRECT in the last 72 hours. Thyroid Function Tests: No results for input(s): TSH, T4TOTAL, FREET4, T3FREE, THYROIDAB in the last 72 hours. Anemia Panel: No results for input(s): VITAMINB12, FOLATE, FERRITIN, TIBC, IRON, RETICCTPCT in the last 72 hours. Sepsis Labs: No results for input(s): PROCALCITON, LATICACIDVEN in the last 168 hours.  Recent Results (from the past 240 hour(s))  Blood Culture (routine x 2)     Status: None   Collection Time: 01/19/18  4:26 PM  Result Value Ref Range Status   Specimen  Description BLOOD SITE NOT SPECIFIED  Final   Special Requests   Final    BOTTLES DRAWN AEROBIC AND ANAEROBIC Blood Culture adequate volume   Culture   Final    NO GROWTH 5 DAYS Performed at [facility], [address] [place], [place] [address]    Report Status 01/24/2018 FINAL  Final  Blood Culture (routine x 2)     Status: None   Collection Time: 01/19/18  4:31 PM  Result Value Ref Range Status   Specimen Description BLOOD BLOOD RIGHT FOREARM  Final   Special Requests   Final    BOTTLES DRAWN AEROBIC AND ANAEROBIC Blood Culture adequate volume   Culture   Final    NO GROWTH 5 DAYS Performed at [facility], [place] [address] [place], [place] [address]    Report Status 01/24/2018 FINAL  Final  Surgical pcr screen     Status: None   Collection Time: 01/22/18  1:27 PM  Result Value Ref Range Status   MRSA, PCR NEGATIVE NEGATIVE Final   Staphylococcus aureus NEGATIVE NEGATIVE Final    Comment: (NOTE) The Xpert SA Assay (FDA approved for NASAL specimens in patients 55 years of age and older), is one component of a comprehensive surveillance program. It is not intended to diagnose infection nor to guide or monitor treatment. Performed at [facility], [place] [address] [place],  [address]   Aerobic/Anaerobic Culture (surgical/deep wound)     Status: None   Collection Time: 01/22/18  6:49 PM  Result Value Ref Range Status   Specimen Description ABSCESS LABIA  Final   Special Requests PATIENT ON FOLLOWING VANC FLAGYL AZTREONAM  Final   Gram Stain   Final    MODERATE WBC PRESENT, PREDOMINANTLY PMN ABUNDANT GRAM NEGATIVE RODS ABUNDANT GRAM POSITIVE COCCI    Culture  Final    RARE ENTEROCOCCUS CASSELIFLAVUS FEW STREPTOCOCCUS GROUP C NO ANAEROBES ISOLATED Performed at [facility], [place] [address], [place], [place] [address]    Report Status 01/27/2018 FINAL  Final   Organism ID, Bacteria ENTEROCOCCUS CASSELIFLAVUS  Final      Susceptibility    Enterococcus casseliflavus - MIC*    AMPICILLIN <=2 SENSITIVE Sensitive     VANCOMYCIN RESISTANT Resistant     GENTAMICIN SYNERGY SENSITIVE Sensitive     LINEZOLID 2 SENSITIVE Sensitive     * RARE ENTEROCOCCUS CASSELIFLAVUS  Culture, Urine     Status: Abnormal   Collection Time: 01/22/18  8:21 PM  Result Value Ref Range Status   Specimen Description URINE, CLEAN CATCH  Final   Special Requests   Final    NONE Performed at [facility], [place] [address], [place], [place] [address]    Culture MULTIPLE SPECIES PRESENT, SUGGEST RECOLLECTION (A)  Final   Report Status 01/25/2018 FINAL  Final     Radiology Studies: No results found.  Scheduled Meds: . [MAR Hold] acetaminophen  650 mg Oral Q6H  . [MAR Hold] busPIRone  30 mg Oral BID  . [MAR Hold] famotidine  20 mg Oral Daily  . [MAR Hold] heparin  5,000 Units Subcutaneous Q8H  . [MAR Hold] insulin regular human CONCENTRATED  170-370 Units Subcutaneous BID WC  . [MAR Hold] mometasone-formoterol  2 puff Inhalation BID  . [MAR Hold] pregabalin  300 mg Oral BID  . [MAR Hold] traZODone  150 mg Oral QHS   Continuous Infusions: . sodium chloride 50 mL/hr at 01/28/18 1600  . [MAR Hold] aztreonam Stopped (01/29/18 0904)  . lactated ringers 10 mL/hr at 01/29/18 1136  . [MAR Hold] linezolid (ZYVOX) IV Stopped (01/28/18 2257)  . [MAR Hold] metronidazole 500 mg (01/29/18 0954)     LOS: 10 days   Marylu Lund, MD [facility] Pager [phone]  If 7PM-7AM, please contact night-coverage [email or web address] Password [facility] 01/29/2018, 12:36 PM

## 2018-01-29 NOTE — Anesthesia Postprocedure Evaluation (Signed)
Anesthesia Post Note  Patient: Rami Stroud  Procedure(s) Performed: IRRIGATION AND DEBRIDEMENT VULVA (N/A Vagina )     Patient location during evaluation: PACU Anesthesia Type: General Level of consciousness: awake and alert Pain management: pain level controlled Vital Signs Assessment: post-procedure vital signs reviewed and stable Respiratory status: spontaneous breathing, nonlabored ventilation, respiratory function stable and patient connected to nasal cannula oxygen Cardiovascular status: blood pressure returned to baseline and stable Postop Assessment: no apparent nausea or vomiting Anesthetic complications: no    Last Vitals:  Vitals:   01/29/18 1410 01/29/18 1425  BP: (!) 125/59 129/72  Pulse: 99 100  Resp: 13 15  Temp: (!) 36.3 C   SpO2: 95% 99%    Last Pain:  Vitals:   01/29/18 1410  TempSrc:   PainSc: [place] A Wade Asebedo

## 2018-01-29 NOTE — Op Note (Signed)
Preoperative Diagnosis: Necrotizing soft tissue infection left groin and perineum  Postoprative Diagnosis: Same  Procedure: Procedure(s): Mabel VULVA   Surgeon: Excell Seltzer T   Assistants: Modena Jansky, [place]  Anesthesia:  General endotracheal anesthesia  Indications: Patient is a 47 year old female, morbidly obese, who presented with an abscess and infection in her right groin and mons pubis.  She has undergone 2 previous explorations and debridement with findings of extensive necrotizing soft tissue infection involving skin and deep subcutaneous tissue of the left perineum, mons and groin.  She is returned to the operating room today for planned reexploration and wound debridement as necessary.    Procedure Detail: Patient was brought to the operating room, placed in the supine position on the operating table, and general endotracheal anesthesia induced.  She was carefully positioned in lithotomy position.  The previous packing was removed and the wound thighs and lower abdomen were widely sterilely prepped and draped.  She was already on broad-spectrum IV antibiotics.  Patient timeout was performed and correct procedure verified. Examination revealed the wound to be 18 cm in length anterior to posterior by 8 cm in width and approximately 4 to 5 cm deep.  Wound was thoroughly explored.  On the medial aspect over the left labia there was thinning of the skin and approximately 1 cm in width area of necrotic skin and subcutaneous tissue that was sharply excised back to healthy bleeding tissue.  On the most posterior and anterior areas of the wound it did track somewhat deeply with some purulent drainage and necrotic fat that was suctioned and irrigated and a small amount of necrotic fatty tissue was sharply debrided with scissors.  In the center of the wound there were 2 areas of tracking into the more deep subcutaneous tissue, one up toward the mons and 1 more lateral  toward the groin crease with purulent drainage from these.  In each case I excised overlying deep subcutaneous tissue with cautery to open up these tracts and they were irrigated and debrided.  Some further minor skin and subcutaneous debridement was performed with scissors and knife.  In total I removed approximately 7 x 7 x 3 cm of skin and subcutaneous tissue.  At this point the tissue appeared relatively healthy and there was no evidence of deeper tracking or any further origin of purulent drainage.  Hemostasis was obtained with cautery and a few figure-of-eight sutures of 3-0 Vicryl.  The wound was packed open with moist Kerlix gauze.  Sponge needle and instrument counts were correct.  Patient taken to recovery in good condition.    Findings: As above  Estimated Blood Loss:  less than 50 mL         Drains: Wound packed with moist saline gauze.  Blood Given: none          Specimens: Skin and subcutaneous tissue        Complications:  * No complications entered in OR log *         Disposition: PACU - hemodynamically stable.         Condition: stable

## 2018-01-29 NOTE — Anesthesia Preprocedure Evaluation (Addendum)
Anesthesia Evaluation  Patient identified by MRN, date of birth, ID band Patient awake    Reviewed: Allergy & Precautions, NPO status , Patient's Chart, lab work & pertinent test results  Airway Mallampati: I  TM Distance: >3 FB Neck ROM: Full    Dental no notable dental hx. (+) Teeth Intact, Dental Advisory Given   Pulmonary COPD, former smoker,    Pulmonary exam normal        Cardiovascular hypertension, Pt. on medications + CAD  Normal cardiovascular exam     Neuro/Psych    GI/Hepatic GERD  Controlled and Medicated,  Endo/Other  diabetes, Type 2, Insulin Dependent  Renal/GU Renal InsufficiencyRenal disease     Musculoskeletal   Abdominal   Peds  Hematology   Anesthesia Other Findings   Reproductive/Obstetrics                             Anesthesia Physical Anesthesia Plan  ASA: IV  Anesthesia Plan: General   Post-op Pain Management:    Induction: Intravenous  PONV Risk Score and Plan: 3 and Treatment may vary due to age or medical condition and Ondansetron  Airway Management Planned: Oral ETT  Additional Equipment:   Intra-op Plan:   Post-operative Plan: Extubation in OR  Informed Consent: I have reviewed the patients History and Physical, chart, labs and discussed the procedure including the risks, benefits and alternatives for the proposed anesthesia with the patient or authorized representative who has indicated his/her understanding and acceptance.   Dental advisory given  Plan Discussed with: CRNA  Anesthesia Plan Comments:          Anesthesia Quick Evaluation

## 2018-01-30 ENCOUNTER — Encounter (HOSPITAL_COMMUNITY): Payer: Self-pay | Admitting: General Surgery

## 2018-01-30 LAB — CBC
HCT: 27.6 % — ABNORMAL LOW (ref 36.0–46.0)
Hemoglobin: 8.2 g/dL — ABNORMAL LOW (ref 12.0–15.0)
MCH: 26.8 pg (ref 26.0–34.0)
MCHC: 29.7 g/dL — ABNORMAL LOW (ref 30.0–36.0)
MCV: 90.2 fL (ref 78.0–100.0)
Platelets: 296 10*3/uL (ref 150–400)
RBC: 3.06 MIL/uL — ABNORMAL LOW (ref 3.87–5.11)
RDW: 18.7 % — ABNORMAL HIGH (ref 11.5–15.5)
WBC: 12.6 10*3/uL — ABNORMAL HIGH (ref 4.0–10.5)

## 2018-01-30 LAB — BASIC METABOLIC PANEL
Anion gap: 7 (ref 5–15)
BUN: 14 mg/dL (ref 6–20)
CO2: 24 mmol/L (ref 22–32)
Calcium: 8 mg/dL — ABNORMAL LOW (ref 8.9–10.3)
Chloride: 105 mmol/L (ref 98–111)
Creatinine, Ser: 0.87 mg/dL (ref 0.44–1.00)
GFR calc Af Amer: >60 mL/min (ref >60)
GFR calc non Af Amer: >60 mL/min (ref >60)
Glucose, Bld: 94 mg/dL (ref 70–99)
Potassium: 4.8 mmol/L (ref 3.5–5.1)
Sodium: 136 mmol/L (ref 135–145)

## 2018-01-30 LAB — GLUCOSE, CAPILLARY
Glucose-Capillary: 98 mg/dL (ref 70–99)
Glucose-Capillary: 147 mg/dL — ABNORMAL HIGH (ref 70–99)
Glucose-Capillary: 154 mg/dL — ABNORMAL HIGH (ref 70–99)
Glucose-Capillary: 212 mg/dL — ABNORMAL HIGH (ref 70–99)

## 2018-01-30 MED ORDER — JUVEN PO PACK
1.0000 | PACK | Freq: Two times a day (BID) | ORAL | Status: DC
  Administered 2018-01-30 – 2018-01-31 (×3): 1 via ORAL
  Filled 2018-01-30 (×8): qty 1

## 2018-01-30 NOTE — Progress Notes (Signed)
Patient refused CPAP. Encouraged patient to call if CPAP desired.

## 2018-01-30 NOTE — Progress Notes (Signed)
PROGRESS NOTE    [name]  BZJ:[id number] DOB: [birth year] DOA: 01/19/2018 PCP: Pleas Koch, NP    Brief Narrative:  47 y.o.femalewith medical history significant for but not limited to poorly controlled resistant t2dm, morbid obesity, copd with possible superimposed obesity hypoventilation syndrome, on chronic O 2 (2L during the day, 4 at night), osa on nightly cpcp, htn, dCHF, and CAD  Presenting with 4 day history of vulvar pain,  And limited for uncontrolled diabetes with mons pubis cellulitis and secondary sepsis.  She was given 2 L of normal saline and antibiotics initiated at the ED prior to her admission   Assessment & Plan:   Principal Problem:   Necrotizing soft tissue infection Active Problems:   Morbid (severe) obesity due to excess calories (HCC)   Tobacco abuse   COPD GOLD 0    Seizure disorder (HCC)   Bipolar disorder (HCC)   Sleep apnea   Essential hypertension   Diabetes (HCC)   IBS (irritable bowel syndrome)   Sepsis (HCC)   Elevated serum hCG   Open wound of genital labia   Vulvar abscess   Poorly controlled type 2 diabetes mellitus ([place])   DNR (do not resuscitate)   CKD (chronic kidney disease), stage III (HCC)   Pressure injury of skin   1. Necrotizing soft tissue infection of Mons/ vulva- went to OR 7/15 - SP I&D labial/ vulvar area 01/22/18 by Gen Surgery - pcn allergic, Patient had been continued on  IV aztreonam / metronidazole/vancomycin - Patient was seen by palliative care 7/15, patient wants the following:             - DNR             - No trach             - Does not want to be re-intubated.              - No feeding tubes             - Not keen on dialysis; not a good long term candidateregardless given      co-morbidities and overall weakness - On baseline O2 at present - Wound culture growing VRE -Had discussed with ID. D/c vanc and started linezolid on 7/18. Stopped SSRI. -Discussed with General surgery. Wounds recently  noted to be more purulent and patient underwent surgical debridement 7/19 with extensive debridement performed -Patient had undergone additional surgical debridement on 7/22.  -Continuing on zyvox, tolerating. WBC trending down   2. Uncontrolled type 2 diabetes mellitus: - metformin presently remains on hold - currently on high dose BID with SSI coverage -U500 has been continued. Glucose trends stable   3. Acute on chronic kidney injury:  - Cr baseline is 1.4- 1.8 (CKD III). Cr now at baseline  -diuretics on hold (metolazone, torsemide and Aldactone)  - have stopped NSAIDs  -Labs reviewed. Renal function remains stable  4. elevated HCG:  Reportedly has not had sexual intercourse for past 4-5 years  follow clinically  -Recommend close outpatient follow up   5. COPD / OSA:    - sees pulm in office for COPD gold stage 0 w nocturnal hypoxemia (on noct O2 at 3L as of sept 2018 office visit)  - Supportive care, p.r.n. Albuterol, on symbicort at home  - no wheezing currently -Patient has been refusing CPAP at night -Recent ABG unremarkabe without evidence of hypercarbia -Currently on minimal O2 support   6.  Chronic diastolic CHF:   -  admitted here by cardiology once in 2017 and once late 2018 for dCHF exacerbation - diuretics on hold in view of sepsis - follow daily wts, - appears stable. Given normalization of renal function, will hold further IVF  7.  DNR:  Seen by palliative care 7/15, see their notes, remains DNR and no re-intubation  DVT prophylaxis: Heparin subq Code Status: DNR Family Communication: Pt in room, family not at bedside Disposition Plan: Uncertain at this time  Consultants:   Palliative care  General Surgery  Procedures:  -SP I&D labial/ vulvar area 01/22/18 by Gen Surgery -Surgical debridement 7/19 -Surgical debridement 7/22  Antimicrobials: Anti-infectives (From admission, onward)   Start     Dose/Rate Route Frequency Ordered Stop   01/25/18  1530  linezolid (ZYVOX) IVPB 600 mg    Note to Pharmacy:  Have stopped fluoxetine   600 mg 300 mL/hr over 60 Minutes Intravenous Every 12 hours 01/25/18 1437     01/19/18 1800  vancomycin (VANCOCIN) 1,500 mg in sodium chloride 0.9 % 500 mL IVPB  Status:  Discontinued     1,500 mg 250 mL/hr over 120 Minutes Intravenous Every 24 hours 01/19/18 1639 01/25/18 1437   01/19/18 1700  aztreonam (AZACTAM) 1 g in sodium chloride 0.9 % 100 mL IVPB     1 g 200 mL/hr over 30 Minutes Intravenous Every 8 hours 01/19/18 1639     01/19/18 1700  metroNIDAZOLE (FLAGYL) IVPB 500 mg     500 mg 100 mL/hr over 60 Minutes Intravenous Every 8 hours 01/19/18 1639        Subjective: No complaints this AM  Objective: Vitals:   01/29/18 1948 01/29/18 2330 01/30/18 0359 01/30/18 0738  BP:  138/77 116/81 115/88  Pulse:  87 72 76  Resp:  14 11 16   Temp:  98.8 F (37.1 C) 98.6 F (37 C) 98.1 F (36.7 C)  TempSrc:  Oral Oral Oral  SpO2: 100% 100% 98% 99%  Weight:   (!) 146.4 kg (322 lb 12.1 oz)   Height:        Intake/Output Summary (Last 24 hours) at 01/30/2018 0942 Last data filed at 01/30/2018 0900 Gross per 24 hour  Intake 6055.25 ml  Output 475 ml  Net 5580.25 ml   Filed Weights   01/28/18 0500 01/29/18 0345 01/30/18 0359  Weight: (!) 143.9 kg (317 lb 3.9 oz) (!) 143.2 kg (315 lb 11.2 oz) (!) 146.4 kg (322 lb 12.1 oz)    Examination: General exam: Awake, laying in bed, in nad Respiratory system: Normal respiratory effort, no wheezing Cardiovascular system: regular rate, s1, s2 Gastrointestinal system: Soft, nondistended, positive BS Central nervous system: CN2-12 grossly intact, strength intact Extremities: Perfused, no clubbing Psychiatry: Mood normal // no visual hallucinations   Data Reviewed: I have personally reviewed following labs and imaging studies  CBC: Recent Labs  Lab 01/26/18 0240 01/27/18 0245 01/28/18 0223 01/29/18 0316 01/30/18 0432  WBC 18.9* 18.9* 13.3* 13.4*  12.6*  HGB 9.7* 9.3* 9.8* 9.4* 8.2*  HCT 31.5* 31.2* 33.2* 31.3* 27.6*  MCV 86.8 88.4 89.5 89.2 90.2  PLT 274 288 299 312 867   Basic Metabolic Panel: Recent Labs  Lab 01/26/18 0240 01/27/18 0245 01/28/18 0223 01/29/18 0316 01/30/18 0432  NA 139 139 142 139 136  K 4.4 5.2* 4.4 5.0 4.8  CL 104 105 107 105 105  CO2 26 25 28 27 24   GLUCOSE 124* 220* 80 206* 94  BUN 27* 24* 21* 15 14  CREATININE 0.85  0.98 0.89 0.80 0.87  CALCIUM 8.3* 8.2* 8.5* 8.1* 8.0*   GFR: Estimated Creatinine Clearance: 112.9 mL/min (by C-G formula based on SCr of 0.87 mg/dL). Liver Function Tests: No results for input(s): AST, ALT, ALKPHOS, BILITOT, PROT, ALBUMIN in the last 168 hours. No results for input(s): LIPASE, AMYLASE in the last 168 hours. No results for input(s): AMMONIA in the last 168 hours. Coagulation Profile: No results for input(s): INR, PROTIME in the last 168 hours. Cardiac Enzymes: No results for input(s): CKTOTAL, CKMB, CKMBINDEX, TROPONINI in the last 168 hours. BNP (last 3 results) No results for input(s): PROBNP in the last 8760 hours. HbA1C: No results for input(s): HGBA1C in the last 72 hours. CBG: Recent Labs  Lab 01/29/18 1138 01/29/18 1417 01/29/18 1721 01/29/18 2113 01/30/18 0737  GLUCAP 198* 149* 148* 118* 98   Lipid Profile: No results for input(s): CHOL, HDL, LDLCALC, TRIG, CHOLHDL, LDLDIRECT in the last 72 hours. Thyroid Function Tests: No results for input(s): TSH, T4TOTAL, FREET4, T3FREE, THYROIDAB in the last 72 hours. Anemia Panel: No results for input(s): VITAMINB12, FOLATE, FERRITIN, TIBC, IRON, RETICCTPCT in the last 72 hours. Sepsis Labs: No results for input(s): PROCALCITON, LATICACIDVEN in the last 168 hours.  Recent Results (from the past 240 hour(s))  Surgical pcr screen     Status: None   Collection Time: 01/22/18  1:27 PM  Result Value Ref Range Status   MRSA, PCR NEGATIVE NEGATIVE Final   Staphylococcus aureus NEGATIVE NEGATIVE Final     Comment: (NOTE) The Xpert SA Assay (FDA approved for NASAL specimens in patients 19 years of age and older), is one component of a comprehensive surveillance program. It is not intended to diagnose infection nor to guide or monitor treatment. Performed at [name], [place] [address], [place], [place] [address]   Aerobic/Anaerobic Culture (surgical/deep wound)     Status: None   Collection Time: 01/22/18  6:49 PM  Result Value Ref Range Status   Specimen Description ABSCESS LABIA  Final   Special Requests PATIENT ON FOLLOWING VANC FLAGYL AZTREONAM  Final   Gram Stain   Final    MODERATE WBC PRESENT, PREDOMINANTLY PMN ABUNDANT GRAM NEGATIVE RODS ABUNDANT GRAM POSITIVE COCCI    Culture   Final    RARE ENTEROCOCCUS CASSELIFLAVUS FEW STREPTOCOCCUS GROUP C NO ANAEROBES ISOLATED Performed at [facility], [place] [address], [place], [place] [address]    Report Status 01/27/2018 FINAL  Final   Organism ID, Bacteria ENTEROCOCCUS CASSELIFLAVUS  Final      Susceptibility   Enterococcus casseliflavus - MIC*    AMPICILLIN <=2 SENSITIVE Sensitive     VANCOMYCIN RESISTANT Resistant     GENTAMICIN SYNERGY SENSITIVE Sensitive     LINEZOLID 2 SENSITIVE Sensitive     * RARE ENTEROCOCCUS CASSELIFLAVUS  Culture, Urine     Status: Abnormal   Collection Time: 01/22/18  8:21 PM  Result Value Ref Range Status   Specimen Description URINE, CLEAN CATCH  Final   Special Requests   Final    NONE Performed at [facility], [place] [address] [place], [place] [address]    Culture MULTIPLE SPECIES PRESENT, SUGGEST RECOLLECTION (A)  Final   Report Status 01/25/2018 FINAL  Final     Radiology Studies: No results found.  Scheduled Meds: . acetaminophen  650 mg Oral Q6H  . busPIRone  30 mg Oral BID  . famotidine  20 mg Oral Daily  . heparin  5,000 Units Subcutaneous Q8H  .  insulin regular human CONCENTRATED  170-370 Units Subcutaneous BID WC  . mometasone-formoterol  2 puff Inhalation  BID  . pregabalin  300 mg Oral BID  . traZODone  150 mg Oral QHS   Continuous Infusions: . sodium chloride Stopped (01/30/18 0842)  . aztreonam 1 g (01/30/18 0841)  . lactated ringers 10 mL/hr at 01/29/18 1136  . linezolid (ZYVOX) IV Stopped (01/29/18 2154)  . metronidazole Stopped (01/30/18 0245)     LOS: 11 days   Marylu Lund, MD [facility] Pager [phone]  If 7PM-7AM, please contact night-coverage [email or web address] Password [facility] 01/30/2018, 9:42 AM

## 2018-01-30 NOTE — Progress Notes (Signed)
Palliative:  I met again today with [name] and husband, [name], at bedside. [name] is much more awake and alert today I am happy to see. Most of the week last week she was fairly lethargic. We again discussed that her wound/infection will take time to heal and to be patient. We also discussed hydrotherapy and what to expect and the process of hydrotherapy. Encouraged good nutrition and protein intake to assist with wound healing. Having issues obtaining meals since surgery yesterday - RN and NT helping to settle this. Offered emotional support.   Exam: Awake, alert, oriented. No distress. No pain while lying still.   15 min  Vinie Sill, NP Palliative Medicine Team Pager # [phone] (M-F 8a-5p) Team Phone # [phone] (Nights/Weekends)

## 2018-01-30 NOTE — Progress Notes (Signed)
Pharmacy Antibiotic Note  [name] is a 47 y.o. female admitted on 01/19/2018 with L groin wound.  Pharmacy has been following for Aztreonam, and Flagyl dosing.  Pt is s/p  I&D on 7/19 and 7/22 of the wound.  Cultures revealed Enterococcus casseliflavus which was Vanc resistant and she was changed to Linezolid therapy.  She seems to be tolerating antibiotics appropriately and WBC is trending down.   Plan: Continue Metronidazole 500 mg q8h Continue Aztreonam 1 g q8h Linezolid 600 mg q12 hr Follow up abx LOT plans and narrow as able Monitor renal fxn, culture data, and clinical progress  Height: 5\' 4"  (162.6 cm) Weight: (!) 322 lb 12.1 oz (146.4 kg) IBW/kg (Calculated) : 54.7  Temp (24hrs), Avg:98.2 F (36.8 C), Min:97.3 F (36.3 C), Max:98.8 F (37.1 C)  Recent Labs  Lab 01/26/18 0240 01/27/18 0245 01/28/18 0223 01/29/18 0316 01/30/18 0432  WBC 18.9* 18.9* 13.3* 13.4* 12.6*  CREATININE 0.85 0.98 0.89 0.80 0.87    Estimated Creatinine Clearance: 112.9 mL/min (by C-G formula based on SCr of 0.87 mg/dL).    Allergies  Allergen Reactions  . Adhesive [Tape] Rash and Other (See Comments)    TAKES OFF THE SKIN (CERTAIN MEDICAL TAPES DO THIS!!)  . Metoprolol Shortness Of Breath    Occurrence of shortness of breath after 3 days  . Montelukast Shortness Of Breath  . Morphine Sulfate Anaphylaxis, Shortness Of Breath and Nausea And Vomiting    Swollen Throat - Able to tolerate dilaudid  . Penicillins Anaphylaxis, Hives and Shortness Of Breath    Throat swells Has patient had a PCN reaction causing immediate rash, facial/tongue/throat swelling, SOB or lightheadedness with hypotension: Yes Has patient had a PCN reaction causing severe rash involving mucus membranes or skin necrosis: No Has patient had a PCN reaction that required hospitalization: Yes Has patient had a PCN reaction occurring within the last 10 years: No If all of the above answers are "NO", then may proceed with  Cephalosporin use.   . Prednisone Anaphylaxis  . Diltiazem Swelling  . Gabapentin Swelling   Antimicrobials this admission: Vancomycin 7/12>>7/18 Metronidazole 7/12>> Aztreonam 7/12>> Linezolid 7/18>  Dose adjustments this admission: 7/15 VT = 17, no change  Microbiology results: 7/12 blood x 2 >> NG - final 7/15 wound culture >> Enterococcus casseliflavus  Vertis Kelch, PharmD PGY1 Pharmacy Resident Phone [phone] 01/30/2018       10:12 AM

## 2018-01-30 NOTE — Progress Notes (Addendum)
Initial Nutrition Assessment  DOCUMENTATION CODES:   Morbid obesity  INTERVENTION:   -1 packet Juven BID, each packet provides 80 calories, 8 grams of carbohydrate, and 14 grams of amino acids; supplement contains CaHMB, glutamine, and arginine, to promote wound healing  NUTRITION DIAGNOSIS:   Increased nutrient needs related to wound healing as evidenced by estimated needs.  GOAL:   Patient will meet greater than or equal to 90% of their needs  MONITOR:   PO intake, Supplement acceptance, Labs, Weight trends, Skin, I & O's  REASON FOR ASSESSMENT:   LOS    ASSESSMENT:   [name] is a 47 y.o. female with medical history significant for poorly controlled resistant t2dm, morbid obesity, copd with possible superimposed obesity hypoventilation syndrome, chronic o2 (2L during the day, 4 at night), osa on nightly cpcp, htn, dCHF, CAD (per chart normal nuclear stress 2017, cath 2015 without intervention), bipolar disorder, and tobacco abuse, who presents with vulvar pain.   RD pulled to pt chart secondary to LOS and multiple wounds and I&D's.   Pt admitted with sepsis related to vulvular cellulitis.   7/15- s/p  I&D of labial/vulvular area (removed skin and necrotic fat) 7/18- hydrotherapy initiated 7/19- s/p debridement of vulvular necrotizing soft tissue infection, pulsatile lavage, and application of a-cell 4/48- s/p I&D of vulva  Per general surgery notes, plan to restart hydrotherapy tomorrow.   Spoke with pt at bedside, who reports fair appetite. She denies eating breakfast this morning and does not recall eating dinner. Noted meal completion 50-100% per doc flowsheets. Per pt, she did not eat well over the past few days due to being on a liquid diet (she does not like broth and has been drinking mainly fruit juices). PTA she reports fair appetite- per pt she "nit picks" (grzes) throughout the day. Commonly consumed foods are fruit and watermelon.   Last Hgb A1c: 10.4  (01/20/18). Discussed home DM control with pt. She reports difficulty with control- blood sugars are consistently between 400-500. Noted home regimen of 400 units U-500 BID.   Discussed with pt importance of good meal intake to promote healing.   Palliative care following pt.   Labs reviewed: CBGS: 98-149 (inpatient orders for glycemic control are 170-370 units insulin regular BID).   NUTRITION - FOCUSED PHYSICAL EXAM:    Most Recent Value  Orbital Region  No depletion  Upper Arm Region  No depletion  Thoracic and Lumbar Region  No depletion  Buccal Region  No depletion  Temple Region  No depletion  Clavicle Bone Region  No depletion  Clavicle and Acromion Bone Region  No depletion  Scapular Bone Region  No depletion  Dorsal Hand  No depletion  Patellar Region  No depletion  Anterior Thigh Region  No depletion  Posterior Calf Region  No depletion  Edema (RD Assessment)  Moderate  Hair  Reviewed  Eyes  Reviewed  Mouth  Reviewed  Skin  Reviewed  Nails  Reviewed       Diet Order:   Diet Order           Diet Carb Modified Fluid consistency: Thin; Room service appropriate? Yes  Diet effective now          EDUCATION NEEDS:   Education needs have been addressed  Skin:  Skin Assessment: Skin Integrity Issues: Skin Integrity Issues:: Incisions, Stage I, Other (Comment) Stage I: anus Incisions: closed- lt vagina, perineum Other: lt labia non pressure wound d/t cellulitis  Last BM:  01/28/18  Height:   Ht Readings from Last 1 Encounters:  01/26/18 5\' 4"  (1.626 m)    Weight:   Wt Readings from Last 1 Encounters:  01/30/18 (!) 322 lb 12.1 oz (146.4 kg)    Ideal Body Weight:  54.5 kg  BMI:  Body mass index is 55.4 kg/m.  Estimated Nutritional Needs:   Kcal:  1650-1850  Protein:  120-135 grams  Fluid:  >1.6 L    [name] A. Jimmye Norman, RD, LDN, CDE Pager: [phone] After hours Pager: [phone]

## 2018-01-30 NOTE — Progress Notes (Signed)
Patient ID: [name], female   DOB: [birth year], 47 y.o.   MRN: [id number]    1 Day Post-Op  Subjective: Patient sleepy, but overall feels ok.  No new comlpaints  Objective: Vital signs in last 24 hours: Temp:  [97.3 F (36.3 C)-98.8 F (37.1 C)] 98.1 F (36.7 C) (07/23 0738) Pulse Rate:  [72-100] 76 (07/23 0738) Resp:  [11-16] 16 (07/23 0738) BP: (96-144)/(49-88) 115/88 (07/23 0738) SpO2:  [95 %-100 %] 99 % (07/23 0738) Weight:  [146.4 kg (322 lb 12.1 oz)] 146.4 kg (322 lb 12.1 oz) (07/23 0359) Last BM Date: 01/28/18  Intake/Output from previous day: 07/22 0701 - 07/23 0700 In: 3921.3 [P.O.:240; I.V.:800; IV Piggyback:2831.3] Out: 475 [Urine:450; Blood:25] Intake/Output this shift: No intake/output data recorded.  PE: Abd: soft, NT, ND, +BS GU: dressing in place and dry and clean  Lab Results:  Recent Labs    01/29/18 0316 01/30/18 0432  WBC 13.4* 12.6*  HGB 9.4* 8.2*  HCT 31.3* 27.6*  PLT 312 296   BMET Recent Labs    01/29/18 0316 01/30/18 0432  NA 139 136  K 5.0 4.8  CL 105 105  CO2 27 24  GLUCOSE 206* 94  BUN 15 14  CREATININE 0.80 0.87  CALCIUM 8.1* 8.0*   PT/INR No results for input(s): LABPROT, INR in the last 72 hours. CMP     Component Value Date/Time   NA 136 01/30/2018 0432   NA 137 01/08/2018 1117   K 4.8 01/30/2018 0432   CL 105 01/30/2018 0432   CO2 24 01/30/2018 0432   GLUCOSE 94 01/30/2018 0432   BUN 14 01/30/2018 0432   BUN 31 (H) 01/08/2018 1117   CREATININE 0.87 01/30/2018 0432   CREATININE 1.20 (H) 05/24/2016 0938   CALCIUM 8.0 (L) 01/30/2018 0432   PROT 7.2 01/22/2018 0341   PROT 7.8 01/08/2018 1117   ALBUMIN 2.4 (L) 01/22/2018 0341   ALBUMIN 4.1 01/08/2018 1117   AST 25 01/22/2018 0341   ALT 28 01/22/2018 0341   ALKPHOS 167 (H) 01/22/2018 0341   BILITOT 1.1 01/22/2018 0341   BILITOT 0.5 01/08/2018 1117   GFRNONAA >60 01/30/2018 0432   GFRAA >60 01/30/2018 0432   Lipase     Component Value Date/Time   LIPASE 82 (H) 07/19/2017 1942       Studies/Results: No results found.  Anti-infectives: Anti-infectives (From admission, onward)   Start     Dose/Rate Route Frequency Ordered Stop   01/25/18 1530  linezolid (ZYVOX) IVPB 600 mg    Note to Pharmacy:  Have stopped fluoxetine   600 mg 300 mL/hr over 60 Minutes Intravenous Every 12 hours 01/25/18 1437     01/19/18 1800  vancomycin (VANCOCIN) 1,500 mg in sodium chloride 0.9 % 500 mL IVPB  Status:  Discontinued     1,500 mg 250 mL/hr over 120 Minutes Intravenous Every 24 hours 01/19/18 1639 01/25/18 1437   01/19/18 1700  aztreonam (AZACTAM) 1 g in sodium chloride 0.9 % 100 mL IVPB     1 g 200 mL/hr over 30 Minutes Intravenous Every 8 hours 01/19/18 1639     01/19/18 1700  metroNIDAZOLE (FLAGYL) IVPB 500 mg     500 mg 100 mL/hr over 60 Minutes Intravenous Every 8 hours 01/19/18 1639         Assessment/Plan Necrotizing soft tissue infection of Mons/vulva -POD7/4/1, s/p I&D of vulvar infection.  -daily dressing changes and prn starting tomorrow.  Will hold on dressing change today.  Start  PT hydrotherapy tomorrow as well. -wound cx VRE, wrote orders to place patient on contact precautions.  CAD CHF RBBB and LAFB COPD, on 4L O2 Hypoventilation syndrome Asthma  Uncontrolled DM Tobacco abuse HTN Seizure disorder CVA, history of at age 18 History of DVTs  FEN -carb mod diet VTE -Heparin 5000 units q8h ID -Azactam 7/12 -->Flagyl 7/12 -->Vancomycin 7/12-->7/18 Zyvox 7/18 -->    LOS: 11 days    Henreitta Cea , [facility] 01/30/2018, 7:56 AM Pager: [phone]

## 2018-01-30 NOTE — Consult Note (Signed)
[facility] Nurse wound consult note Surgical team following for assessment and plan of care to perineal wound.  Please refer to their team for further questions. Please re-consult if further assistance is needed.  Thank-you,  Julien Girt MSN, [place], [place], [place], [place]

## 2018-01-30 NOTE — Plan of Care (Signed)
Continue current care plan 

## 2018-01-31 LAB — CBC
HCT: 28.8 % — ABNORMAL LOW (ref 36.0–46.0)
Hemoglobin: 8.5 g/dL — ABNORMAL LOW (ref 12.0–15.0)
MCH: 26.7 pg (ref 26.0–34.0)
MCHC: 29.5 g/dL — ABNORMAL LOW (ref 30.0–36.0)
MCV: 90.6 fL (ref 78.0–100.0)
Platelets: 279 10*3/uL (ref 150–400)
RBC: 3.18 MIL/uL — ABNORMAL LOW (ref 3.87–5.11)
RDW: 18.8 % — ABNORMAL HIGH (ref 11.5–15.5)
WBC: 12.5 10*3/uL — ABNORMAL HIGH (ref 4.0–10.5)

## 2018-01-31 LAB — BASIC METABOLIC PANEL
Anion gap: 5 (ref 5–15)
BUN: 12 mg/dL (ref 6–20)
CO2: 27 mmol/L (ref 22–32)
Calcium: 8 mg/dL — ABNORMAL LOW (ref 8.9–10.3)
Chloride: 106 mmol/L (ref 98–111)
Creatinine, Ser: 0.81 mg/dL (ref 0.44–1.00)
GFR calc Af Amer: >60 mL/min (ref >60)
GFR calc non Af Amer: >60 mL/min (ref >60)
Glucose, Bld: 237 mg/dL — ABNORMAL HIGH (ref 70–99)
Potassium: 5 mmol/L (ref 3.5–5.1)
Sodium: 138 mmol/L (ref 135–145)

## 2018-01-31 LAB — GLUCOSE, CAPILLARY
Glucose-Capillary: 247 mg/dL — ABNORMAL HIGH (ref 70–99)
Glucose-Capillary: 198 mg/dL — ABNORMAL HIGH (ref 70–99)

## 2018-01-31 NOTE — Progress Notes (Signed)
Physical Therapy Wound Treatment Patient Details  Name: [name] MRN: [id number] Date of Birth: 1971/04/19  Today's Date: 01/31/2018 Time: 3154-0086 Time Calculation (min): 40 min  Subjective  Subjective: Pt reports she is nervous about hydrotherapy. Patient and Family Stated Goals: get wound better Prior Treatments: I&D in OR x 3  Pain Score: Pain Score: Pt premedicated   Wound Assessment  Wound / Incision (Open or Dehisced) 01/21/18 Non-pressure wound Labia Left open area due to cellulitis (Active)  Wound Image   01/31/2018 10:36 AM  Dressing Type ABD;Gauze (Comment);Moist to dry 01/31/2018 10:36 AM  Dressing Changed Changed 01/31/2018 10:36 AM  Dressing Status Clean;Dry;Intact 01/31/2018 10:36 AM  Dressing Change Frequency Twice a day 01/31/2018 10:36 AM  Site / Wound Assessment Pink;Red;Yellow 01/31/2018 10:36 AM  % Wound base Red or Granulating 80% 01/31/2018 10:36 AM  % Wound base Yellow/Fibrinous Exudate 15% 01/31/2018 10:36 AM  % Wound base Black/Eschar 0% 01/31/2018 10:36 AM  % Wound base Other/Granulation Tissue (Comment) 5% 01/31/2018 10:36 AM  Peri-wound Assessment Edema;Erythema (blanchable) 01/31/2018 10:36 AM  Wound Length (cm) 18 cm 01/31/2018 10:36 AM  Wound Width (cm) 6.8 cm 01/31/2018 10:36 AM  Wound Depth (cm) 5 cm 01/31/2018 10:36 AM  Wound Volume (cm^3) 612 cm^3 01/31/2018 10:36 AM  Wound Surface Area (cm^2) 122.4 cm^2 01/31/2018 10:36 AM  Tunneling (cm) 7 cm at lower central part of wound 01/31/2018 10:36 AM  Margins Unattached edges (unapproximated) 01/31/2018 10:36 AM  Closure None 01/31/2018 10:36 AM  Drainage Amount Moderate 01/31/2018 10:36 AM  Drainage Description Purulent;Serous 01/31/2018 10:36 AM  Treatment Debridement (Selective);Hydrotherapy (Pulse lavage);Packing (Saline gauze) 01/31/2018 10:36 AM      Hydrotherapy Pulsed lavage therapy - wound location: L labia/vulva Pulsed Lavage with Suction (psi): 8 psi Pulsed Lavage with Suction - Normal Saline Used:  1000 mL Pulsed Lavage Tip: Tip with splash shield Selective Debridement Selective Debridement - Location: L labia/vulva Selective Debridement - Tools Used: Forceps;Scissors Selective Debridement - Tissue Removed: Yellow and brown unviable tissue   Wound Assessment and Plan  Wound Therapy - Assess/Plan/Recommendations Wound Therapy - Clinical Statement: Pt presents to hydrotherapy after 2 further surgical debridements of labial/vulvar wound. Pt tolerated treatment well. Will benefit from continued hydrotherapy for removal of necrotic tissue, to decrease bioburden and to promote healing. Wound Therapy - Functional Problem List: Decr mobility and activity Factors Delaying/Impairing Wound Healing: Diabetes Mellitus;Immobility;Multiple medical problems Hydrotherapy Plan: Debridement;Dressing change;Patient/family education;Pulsatile lavage with suction Wound Therapy - Frequency: 6X / week Wound Therapy - Follow Up Recommendations: Skilled nursing facility Wound Plan: See above  Wound Therapy Goals- Improve the function of patient's integumentary system by progressing the wound(s) through the phases of wound healing (inflammation - proliferation - remodeling) by: Decrease Necrotic Tissue to: 5 Decrease Necrotic Tissue - Progress: Goal set today Increase Granulation Tissue to: 95 Increase Granulation Tissue - Progress: Goal set today Goals/treatment plan/discharge plan were made with and agreed upon by patient/family: Yes Time For Goal Achievement: 7 days Wound Therapy - Potential for Goals: Good  Goals will be updated until maximal potential achieved or discharge criteria met.  Discharge criteria: when goals achieved, discharge from hospital, MD decision/surgical intervention, no progress towards goals, refusal/missing three consecutive treatments without notification or medical reason.  GP     Shary Decamp [place] 01/31/2018, 10:54 AM Suanne Marker PT [phone]

## 2018-01-31 NOTE — Progress Notes (Signed)
Physical Therapy Treatment Patient Details Name: [name] MRN: [id number] DOB: 15-Aug-1970 Today's Date: 01/31/2018    History of Present Illness Pt is a 47 y.o. female presenting with necrotizing soft tissue infection of mons pubis/vulva and secondary sepsis with encephalopathy. Pt s/p I&D labial/vulvar area on 7/15, 7/19, 7/22. PMHx: COPD, Morbid obesity, COPD, Seizure disorider, Bipolar disorider, Sleep apnea, Essential HTN, Diabetes, IBS, CKD.Marland Kitchen Pt underwent I&D of vulvar wound on monday 7/22.    PT Comments    Pt much improved from Monday. Pt with increased participation with bed mobility and was able to stand and maintain standing x 45 sec x 3 trials with mod/maxAx2. Pt with improved cognition as well. Due to body habitus and pressure/pain to groin/wound site pt with limited sitting tolerance. Acute PT to con't to follow.   Follow Up Recommendations  SNF;Supervision/Assistance - 24 hour     Equipment Recommendations  Other (comment)    Recommendations for Other Services       Precautions / Restrictions Precautions Precautions: Fall Precaution Comments: open vulvar wound, 2lO2 via [place] Restrictions Weight Bearing Restrictions: No    Mobility  Bed Mobility Overal bed mobility: Needs Assistance Bed Mobility: Rolling;Sidelying to Sit Rolling: Min assist;Mod assist Sidelying to sit: Min assist;Mod assist   Sit to supine: Max assist;+2 for physical assistance   General bed mobility comments: with max directional verbal cues pt able to roll from L to R with min/modA to achieve full sidelying and maintain it. pt maxA for trunk elevation to sit up at EOB, maxAx2 for LE and trunk management to return to supine in bed  Transfers Overall transfer level: Needs assistance Equipment used: 2 person hand held assist Transfers: Sit to/from Stand Sit to Stand: Max assist;+2 physical assistance         General transfer comment: able to clear bottom today and achieve full upright  standing x 3 trials. pt with wide base of support due to swollen vagina. pt limited by pain and pressure in vagina  Ambulation/Gait             General Gait Details: limited to 3 side steps to [facility] with max directional verbal cues and assist with R LE advancement, pt able to advance L LE without assist   Stairs             Wheelchair Mobility    Modified Rankin (Stroke Patients Only)       Balance Overall balance assessment: Needs assistance Sitting-balance support: Feet supported;Bilateral upper extremity supported Sitting balance-Leahy Scale: Poor Sitting balance - Comments: pt initially maxA to maintain EOB sitting but then transitioned to minA. sitting tolerance limited by pain/pressure at wound site in vagina Postural control: Posterior lean Standing balance support: Bilateral upper extremity supported Standing balance-Leahy Scale: Poor Standing balance comment: bil UE support with max assist for anterior translation to get weigth on feet                            Cognition Arousal/Alertness: Awake/alert Behavior During Therapy: WFL for tasks assessed/performed Overall Cognitive Status: Impaired/Different from baseline Area of Impairment: Problem solving;Safety/judgement;Following commands                 Orientation Level: (pt oriented to place, date, and situation) Current Attention Level: Sustained Memory: Decreased short-term memory Following Commands: Follows one step commands consistently;Follows multi-step commands inconsistently   Awareness: Emergent Problem Solving: Slow processing;Difficulty sequencing;Requires verbal cues;Requires tactile cues General Comments:  pt aware that hydro "is the thing that I dont like"  pt with improved ability to follow commands and participate today compared to previous sessions      Exercises General Exercises - Lower Extremity Ankle Circles/Pumps: AROM;Both;10 reps;Supine Quad Sets: AROM;Both;5  reps;Supine(with 5 sec hold) Heel Slides: AROM;Both;5 reps;Supine    General Comments        Pertinent Vitals/Pain Pain Assessment: Faces Faces Pain Scale: Hurts even more Pain Location: groin with sitting Pain Descriptors / Indicators: Grimacing;Aching;Pressure Pain Intervention(s): Limited activity within patient's tolerance    Home Living                      Prior Function            PT Goals (current goals can now be found in the care plan section) Acute Rehab PT Goals Patient Stated Goal: return home Progress towards PT goals: Progressing toward goals    Frequency    Min 3X/week      PT Plan Current plan remains appropriate    Co-evaluation PT/OT/SLP Co-Evaluation/Treatment: Yes Reason for Co-Treatment: Complexity of the patient's impairments (multi-system involvement) PT goals addressed during session: Mobility/safety with mobility        AM-PAC PT "6 Clicks" Daily Activity  Outcome Measure  Difficulty turning over in bed (including adjusting bedclothes, sheets and blankets)?: Unable Difficulty moving from lying on back to sitting on the side of the bed? : Unable Difficulty sitting down on and standing up from a chair with arms (e.g., wheelchair, bedside commode, etc,.)?: Unable Help needed moving to and from a bed to chair (including a wheelchair)?: Total Help needed walking in hospital room?: Total Help needed climbing 3-5 steps with a railing? : Total 6 Click Score: 6    End of Session Equipment Utilized During Treatment: Oxygen Activity Tolerance: Patient limited by fatigue;Patient limited by pain Patient left: in bed;with call bell/phone within reach;with nursing/sitter in room;with family/visitor present Nurse Communication: Mobility status PT Visit Diagnosis: Unsteadiness on feet (R26.81);Other abnormalities of gait and mobility (R26.89);Muscle weakness (generalized) (M62.81);History of falling (Z91.81)     Time: 0109-3235 PT Time  Calculation (min) (ACUTE ONLY): 23 min  Charges:  $Therapeutic Activity: 8-22 mins                    G Codes:       [name], PT, DPT Pager #: [phone] Office #: [phone]    [name] M [name] 01/31/2018, 9:36 AM

## 2018-01-31 NOTE — Progress Notes (Signed)
Unsuccessful attempt to place foley catheter x1.

## 2018-01-31 NOTE — Care Management Note (Addendum)
Case Management Note  Patient Details  Name: [name] MRN: [id number] Date of Birth: 10-04-1970  Subjective/Objective:  From home, was active with [facility] (palliative) . Presents with sepsis, necrotizing, soft tissue infection of vulva, uncontrolled dm, a/chronic aki, copd, chronic diastolic chf, s/p I and d, iv abx, start hydrotherapy , per pt eval rec SNF.   7/25 Tomi Bamberger RN BSN- NCM received consult for ltach, spoke with patient she would like to speak with [facility] rep , patient will be getting hydrotherapy , which [facility] is only [facility] that does that.  NCM informed Lauren with [facility] and she is on her way to speak with patient and her spouse.   Patient and husband in agreement with [facility] and they will start precert in am on 2/06.    7/29 Tomi Bamberger RN, BSN - Per Ander Purpura with [facility] she obtained precert on Friday 0/15, MD stated on Friday 7/26 that patient should be ready for ltach today 729.               Action/Plan: NCM will folow for transition of care needs.  Expected Discharge Date:                  Expected Discharge Plan:  Skilled Nursing Facility  In-House Referral:  Clinical Social Work  Discharge planning Services  CM Consult  Post Acute Care Choice:  Durable Medical Equipment([employer] supplies home oxygen) Choice offered to:     DME Arranged:    DME Agency:     HH Arranged:    HH Agency:     Status of Service:  In process, will continue to follow  If discussed at Long Length of Stay Meetings, dates discussed:    Additional Comments:  Zenon Mayo, RN 01/31/2018, 9:39 AM

## 2018-01-31 NOTE — Plan of Care (Signed)
  Problem: Education: Goal: Knowledge of General Education information will improve Outcome: Progressing   Problem: Health Behavior/Discharge Planning: Goal: Ability to manage health-related needs will improve Outcome: Progressing   Problem: Clinical Measurements: Goal: Ability to maintain clinical measurements within normal limits will improve Outcome: Progressing Goal: Will remain free from infection Outcome: Progressing Goal: Diagnostic test results will improve Outcome: Progressing Goal: Respiratory complications will improve Outcome: Progressing Goal: Cardiovascular complication will be avoided Outcome: Progressing   Problem: Nutrition: Goal: Adequate nutrition will be maintained Outcome: Progressing   Problem: Coping: Goal: Level of anxiety will decrease Outcome: Progressing   Problem: Elimination: Goal: Will not experience complications related to bowel motility Outcome: Progressing Goal: Will not experience complications related to urinary retention Outcome: Progressing   Problem: Pain Managment: Goal: General experience of comfort will improve Outcome: Progressing   Problem: Safety: Goal: Ability to remain free from injury will improve Outcome: Progressing   Problem: Skin Integrity: Goal: Risk for impaired skin integrity will decrease Outcome: Progressing   Problem: Clinical Measurements: Goal: Ability to avoid or minimize complications of infection will improve Outcome: Progressing   Problem: Activity: Goal: Risk for activity intolerance will decrease Outcome: Not Progressing   Problem: Skin Integrity: Goal: Skin integrity will improve Outcome: Not Progressing Note:  Vaginal wound with wound care orders in place and dressing changes, breakdown to bottom with barrier cream applied and foam in place over sacrum intact and in place

## 2018-01-31 NOTE — Progress Notes (Signed)
Occupational Therapy Treatment Patient Details Name: [name] MRN: [id number] DOB: 1970-11-19 Today's Date: 01/31/2018    History of present illness Pt is a 47 y.o. female presenting with necrotizing soft tissue infection of mons pubis/vulva and secondary sepsis with encephalopathy. Pt s/p I&D labial/vulvar area on 7/15, 7/19, 7/22. PMHx: COPD, Morbid obesity, COPD, Seizure disorider, Bipolar disorider, Sleep apnea, Essential HTN, Diabetes, IBS, CKD.Marland Kitchen Pt underwent I&D of vulvar wound on monday 7/22.   OT comments  Pt progressing towards established OT goals. Pt performing sit<>stand from EOB x3 to sit on bedpan for BM and then take lateral steps towards [facility] with Max A +2.  Pt demonstrating increased orientation and cognition this session. However, pt continues to present with decreased strength, balance, and cognition. Pt with limited activity tolerance due to pain at wound. Continue to recommend dc to SNF and will continue to follow acutely as admitted.    Follow Up Recommendations  SNF;Supervision/Assistance - 24 hour    Equipment Recommendations  3 in 1 bedside commode    Recommendations for Other Services      Precautions / Restrictions Precautions Precautions: Fall Precaution Comments: open vulvar wound, 2lO2 via [place] Restrictions Weight Bearing Restrictions: No       Mobility Bed Mobility Overal bed mobility: Needs Assistance Bed Mobility: Rolling;Sidelying to Sit Rolling: Min assist;Mod assist Sidelying to sit: Min assist;Mod assist   Sit to supine: Max assist;+2 for physical assistance   General bed mobility comments: with max directional verbal cues pt able to roll from L to R with min/modA to achieve full sidelying and maintain it. pt maxA for trunk elevation to sit up at EOB, maxAx2 for LE and trunk management to return to supine in bed  Transfers Overall transfer level: Needs assistance Equipment used: 2 person hand held assist Transfers: Sit to/from  Stand Sit to Stand: Max assist;+2 physical assistance         General transfer comment: able to clear bottom today and achieve full upright standing x 3 trials. pt with wide base of support due to swollen vagina. pt limited by pain and pressure in vagina    Balance Overall balance assessment: Needs assistance Sitting-balance support: Feet supported;Bilateral upper extremity supported Sitting balance-Leahy Scale: Poor Sitting balance - Comments: pt initially maxA to maintain EOB sitting but then transitioned to minA. sitting tolerance limited by pain/pressure at wound site in vagina Postural control: Posterior lean Standing balance support: Bilateral upper extremity supported Standing balance-Leahy Scale: Poor Standing balance comment: bil UE support with max assist for anterior translation to get weigth on feet                           ADL either performed or assessed with clinical judgement   ADL Overall ADL's : Needs assistance/impaired                         Toilet Transfer: Maximal assistance;+2 for physical assistance(Simulated in room. Three lateral steps to right) Toilet Transfer Details (indicate cue type and reason): Max A +2 for power up into standing and then for balance during three lateral steps to right. Pt with decreased strength, balance, and sequencing of BLEs movement. Pt attempting to sit on bedpan while sitting EOB, but bed pan was too uncomfortable.  Toileting- Clothing Manipulation and Hygiene: Maximal assistance;Bed level Toileting - Clothing Manipulation Details (indicate cue type and reason): Pt dmeonstrating increased participation in rolling. Continues to  require Max A for peri care     Functional mobility during ADLs: Maximal assistance;+2 for physical assistance(three lateral steps to right) General ADL Comments: Pt continues to present with decreased strnegth, balance, cognition, and acitivty tolerance but dmeonstrating progress  compared to prior session.      Vision       Perception     Praxis      Cognition Arousal/Alertness: Awake/alert Behavior During Therapy: WFL for tasks assessed/performed Overall Cognitive Status: Impaired/Different from baseline Area of Impairment: Problem solving;Safety/judgement;Following commands;Attention;Memory                 Orientation Level: (pt oriented to place, date, and situation) Current Attention Level: Selective Memory: Decreased short-term memory Following Commands: Follows one step commands consistently;Follows multi-step commands inconsistently   Awareness: Emergent Problem Solving: Slow processing;Difficulty sequencing;Requires verbal cues;Requires tactile cues General Comments: pt aware that hydro "is the thing that I dont like"  pt with improved ability to follow commands and participate today compared to previous sessions. Pt oriented to place, time, anmd situation. When asked what date it was, pt using calendar to answer demosntrating increased awareness and problem solving.         Exercises Exercises: General Lower Extremity(gave HEP and instructed spouse) General Exercises - Lower Extremity Ankle Circles/Pumps: AROM;Both;10 reps;Supine Quad Sets: AROM;Both;5 reps;Supine(with 5 sec hold) Heel Slides: AROM;Both;5 reps;Supine   Shoulder Instructions       General Comments Husband present throughout session. VSS stable    Pertinent Vitals/ Pain       Pain Assessment: Faces Faces Pain Scale: Hurts even more Pain Location: groin with sitting Pain Descriptors / Indicators: Grimacing;Aching;Pressure Pain Intervention(s): Monitored during session;Limited activity within patient's tolerance;Repositioned;RN gave pain meds during session  Home Living                                          Prior Functioning/Environment              Frequency  Min 2X/week        Progress Toward Goals  OT Goals(current goals can  now be found in the care plan section)  Progress towards OT goals: Progressing toward goals  Acute Rehab OT Goals Patient Stated Goal: return home OT Goal Formulation: With patient/family Time For Goal Achievement: 02/09/18 Potential to Achieve Goals: Fair ADL Goals Pt/caregiver will Perform Home Exercise Program: Increased strength;Both right and left upper extremity;With theraband(bed level theraband ex) Additional ADL Goal #1: Pt will perform bed mobility with mod assist as precursor to ADL. Additional ADL Goal #2: Pt will tolerate sitting EOB x5 minutes with min guard assist during ADL.  Plan Discharge plan remains appropriate    Co-evaluation    PT/OT/SLP Co-Evaluation/Treatment: Yes Reason for Co-Treatment: Complexity of the patient's impairments (multi-system involvement);For patient/therapist safety;To address functional/ADL transfers PT goals addressed during session: Mobility/safety with mobility OT goals addressed during session: ADL's and self-care      AM-PAC PT "6 Clicks" Daily Activity     Outcome Measure   Help from another person eating meals?: A Little Help from another person taking care of personal grooming?: A Little Help from another person toileting, which includes using toliet, bedpan, or urinal?: A Lot Help from another person bathing (including washing, rinsing, drying)?: A Lot Help from another person to put on and taking off regular upper body clothing?: A Lot Help from another  person to put on and taking off regular lower body clothing?: A Lot 6 Click Score: 14    End of Session Equipment Utilized During Treatment: Oxygen;Gait belt  OT Visit Diagnosis: Unsteadiness on feet (R26.81);Muscle weakness (generalized) (M62.81);History of falling (Z91.81);Pain Pain - part of body: (Genitals)   Activity Tolerance Patient tolerated treatment well   Patient Left in bed;with call bell/phone within reach;with family/visitor present;with nursing/sitter in  room   Nurse Communication Mobility status        Time: 2552-5894 OT Time Calculation (min): 14 min  Charges: OT General Charges $OT Visit: 1 Visit OT Treatments $Self Care/Home Management : 8-22 mins  [place], OTR/L Acute Rehab Pager: [phone] Office: [place] 01/31/2018, 9:53 AM

## 2018-01-31 NOTE — Plan of Care (Signed)
  Problem: Education: Goal: Knowledge of General Education information will improve Outcome: Progressing   Problem: Health Behavior/Discharge Planning: Goal: Ability to manage health-related needs will improve Outcome: Progressing   Problem: Clinical Measurements: Goal: Ability to maintain clinical measurements within normal limits will improve Outcome: Progressing   Problem: Education: Goal: Knowledge of General Education information will improve Outcome: Progressing   Problem: Health Behavior/Discharge Planning: Goal: Ability to manage health-related needs will improve Outcome: Progressing   Problem: Clinical Measurements: Goal: Ability to maintain clinical measurements within normal limits will improve Outcome: Progressing Goal: Will remain free from infection Outcome: Progressing Goal: Diagnostic test results will improve Outcome: Progressing Goal: Respiratory complications will improve Outcome: Progressing Goal: Cardiovascular complication will be avoided Outcome: Progressing   Problem: Activity: Goal: Risk for activity intolerance will decrease Outcome: Progressing   Problem: Nutrition: Goal: Adequate nutrition will be maintained Outcome: Progressing   Problem: Coping: Goal: Level of anxiety will decrease Outcome: Progressing   Problem: Elimination: Goal: Will not experience complications related to bowel motility Outcome: Progressing Goal: Will not experience complications related to urinary retention Outcome: Progressing   Problem: Pain Managment: Goal: General experience of comfort will improve Outcome: Progressing   Problem: Safety: Goal: Ability to remain free from injury will improve Outcome: Progressing   Problem: Skin Integrity: Goal: Risk for impaired skin integrity will decrease Outcome: Progressing   Problem: Clinical Measurements: Goal: Ability to avoid or minimize complications of infection will improve Outcome: Progressing    Problem: Skin Integrity: Goal: Skin integrity will improve Outcome: Progressing

## 2018-01-31 NOTE — Progress Notes (Signed)
PROGRESS NOTE  [name] KDT:[id number] DOB: [birth year] DOA: 01/19/2018 PCP: Pleas Koch, NP  HPI/Recap of past 24 hours:  Feeling better, denies pain No fever Husband at bedside  Assessment/Plan: Principal Problem:   Necrotizing soft tissue infection Active Problems:   Morbid (severe) obesity due to excess calories (HCC)   Tobacco abuse   COPD GOLD 0    Seizure disorder (HCC)   Bipolar disorder (HCC)   Sleep apnea   Essential hypertension   Diabetes (HCC)   IBS (irritable bowel syndrome)   Sepsis (HCC)   Elevated serum hCG   Open wound of genital labia   Vulvar abscess   Poorly controlled type 2 diabetes mellitus ([place])   DNR (do not resuscitate)   CKD (chronic kidney disease), stage III (HCC)   Pressure injury of skin  1. Necrotizing soft tissue infection of Mons/ vulva-went to OR 7/15 -SP I&D labial/ vulvar area 01/22/18 by Gen Surgery -pcn allergic, Patient had been continued onIV aztreonam / metronidazole/vancomycin - Patient was seen by palliative care 7/15, patient wants the following: - DNR - No trach - Doesnot wantto be re-intubated.  - No feeding tubes - Not keen on dialysis;not a good long term candidateregardlessgiven co-morbidities and overall weakness - On baseline O2 at present - Wound culture growing VRE -Had discussed with ID. D/c vanc and started linezolid on 7/18. Stopped SSRI. -Discussed with General surgery. Wounds recently noted to be more purulent and patient underwent surgical debridement 7/19 with extensive debridement performed -Patient had undergone additional surgical debridement on 7/22.  -Continuing on zyvox, tolerating. WBC trending down -start hydrotherapy  2. Uncontrolled type 2 diabetes mellitus: -metformin presently remains on hold - currently on high dose BID with SSI coverage -U500 has been continued. Glucose trends stable  3. Acute on  chronic kidney injury:  -Cr baseline is 1.4- 1.8 (CKD III). Cr now at baseline -diuretics on hold (metolazone, torsemide and Aldactone) - have stopped NSAIDs -Labs reviewed. Renal function remains stable  4. elevated HCG: Reportedly has not had sexual intercourse for past 4-5 years follow clinically  -Recommend close outpatient follow up  5. COPD / OSA:  - sees pulm in office for COPD goldstage 0 w nocturnal hypoxemia (on noct O2 at 3L as of sept 2018 office visit) - Supportive care, p.r.n. Albuterol, on symbicort at home -no wheezing currently -Patient has been refusing CPAP at night -Recent ABG unremarkabe without evidence of hypercarbia -Currently on minimal O2 support Body mass index is 55.44 kg/m. meet morbid obesity criteria.   6. Chronic diastolic CHF:  - admitted here by cardiology once in 2017 and once late 2018 for dCHF exacerbation - diuretics on hold in view of sepsis - follow daily wts, - appears stable. Given normalization of renal function, will hold further IVF  7. DNR: Seen by palliative care 7/15, see their notes, remains DNR and no re-intubation  DVT prophylaxis: Heparin subq Code Status: DNR Family Communication: Pt in room, husband at bedside Disposition Plan: Uncertain at this time, need general surgery clearance  Consultants:   Palliative care  General Surgery  Procedures:  -SP I&D labial/ vulvar area 01/22/18 by Gen Surgery -Surgical debridement 7/19 -Surgical debridement 7/22   Antibiotics:  As above   Objective: BP 137/72 (BP Location: Left Arm)   Pulse 81   Temp 97.7 F (36.5 C) (Oral)   Resp 15   Ht 5\' 4"  (1.626 m)   Wt (!) 146.5 kg (322 lb 15.6 oz)   LMP  11/11/2011   SpO2 98%   BMI 55.44 kg/m   Intake/Output Summary (Last 24 hours) at 01/31/2018 3016 Last data filed at 01/31/2018 0400 Gross per 24 hour  Intake 6130.86 ml  Output 750 ml  Net 5380.86 ml   Filed Weights   01/29/18 0345 01/30/18  0359 01/31/18 0500  Weight: (!) 143.2 kg (315 lb 11.2 oz) (!) 146.4 kg (322 lb 12.1 oz) (!) 146.5 kg (322 lb 15.6 oz)    Exam: Patient is examined daily including today on 01/31/2018, exams remain the same as of yesterday except that has changed    General:  NAD, awake and interactive, obese  Cardiovascular: RRR  Respiratory: diminished at base  Abdomen: Soft/ND/NT, positive BS, left pubic mons dressing intact  Musculoskeletal: trace bilateral pedal Edema  Neuro: alert, oriented   Data Reviewed: Basic Metabolic Panel: Recent Labs  Lab 01/27/18 0245 01/28/18 0223 01/29/18 0316 01/30/18 0432 01/31/18 0219  NA 139 142 139 136 138  K 5.2* 4.4 5.0 4.8 5.0  CL 105 107 105 105 106  CO2 25 28 27 24 27   GLUCOSE 220* 80 206* 94 237*  BUN 24* 21* 15 14 12   CREATININE 0.98 0.89 0.80 0.87 0.81  CALCIUM 8.2* 8.5* 8.1* 8.0* 8.0*   Liver Function Tests: No results for input(s): AST, ALT, ALKPHOS, BILITOT, PROT, ALBUMIN in the last 168 hours. No results for input(s): LIPASE, AMYLASE in the last 168 hours. No results for input(s): AMMONIA in the last 168 hours. CBC: Recent Labs  Lab 01/27/18 0245 01/28/18 0223 01/29/18 0316 01/30/18 0432 01/31/18 0219  WBC 18.9* 13.3* 13.4* 12.6* 12.5*  HGB 9.3* 9.8* 9.4* 8.2* 8.5*  HCT 31.2* 33.2* 31.3* 27.6* 28.8*  MCV 88.4 89.5 89.2 90.2 90.6  PLT 288 299 312 296 279   Cardiac Enzymes:   No results for input(s): CKTOTAL, CKMB, CKMBINDEX, TROPONINI in the last 168 hours. BNP (last 3 results) Recent Labs    03/14/17 1840 07/20/17 2053 10/19/17 2148  BNP 103.4* 29.6 54.6    ProBNP (last 3 results) No results for input(s): PROBNP in the last 8760 hours.  CBG: Recent Labs  Lab 01/30/18 0737 01/30/18 1312 01/30/18 1641 01/30/18 2123 01/31/18 0740  GLUCAP 98 147* 154* 212* 247*    Recent Results (from the past 240 hour(s))  Surgical pcr screen     Status: None   Collection Time: 01/22/18  1:27 PM  Result Value Ref Range  Status   MRSA, PCR NEGATIVE NEGATIVE Final   Staphylococcus aureus NEGATIVE NEGATIVE Final    Comment: (NOTE) The Xpert SA Assay (FDA approved for NASAL specimens in patients 11 years of age and older), is one component of a comprehensive surveillance program. It is not intended to diagnose infection nor to guide or monitor treatment. Performed at [facility], [place] [address] [place], [place] [address]   Aerobic/Anaerobic Culture (surgical/deep wound)     Status: None   Collection Time: 01/22/18  6:49 PM  Result Value Ref Range Status   Specimen Description ABSCESS LABIA  Final   Special Requests PATIENT ON FOLLOWING VANC FLAGYL AZTREONAM  Final   Gram Stain   Final    MODERATE WBC PRESENT, PREDOMINANTLY PMN ABUNDANT GRAM NEGATIVE RODS ABUNDANT GRAM POSITIVE COCCI    Culture   Final    RARE ENTEROCOCCUS CASSELIFLAVUS FEW STREPTOCOCCUS GROUP C NO ANAEROBES ISOLATED Performed at [facility], [place] [address], [place], [place] [address]    Report Status 01/27/2018 FINAL  Final   Organism ID, Bacteria ENTEROCOCCUS CASSELIFLAVUS  Final      Susceptibility   Enterococcus casseliflavus - MIC*    AMPICILLIN <=2 SENSITIVE Sensitive     VANCOMYCIN RESISTANT Resistant     GENTAMICIN SYNERGY SENSITIVE Sensitive     LINEZOLID 2 SENSITIVE Sensitive     * RARE ENTEROCOCCUS CASSELIFLAVUS  Culture, Urine     Status: Abnormal   Collection Time: 01/22/18  8:21 PM  Result Value Ref Range Status   Specimen Description URINE, CLEAN CATCH  Final   Special Requests   Final    NONE Performed at [facility], [place] [address] [place], [place] [address]    Culture MULTIPLE SPECIES PRESENT, SUGGEST RECOLLECTION (A)  Final   Report Status 01/25/2018 FINAL  Final     Studies: No results found.  Scheduled Meds: . acetaminophen  650 mg Oral Q6H  . busPIRone  30 mg Oral BID  . famotidine  20 mg Oral Daily  . heparin  5,000 Units Subcutaneous Q8H  . insulin regular human  CONCENTRATED  170-370 Units Subcutaneous BID WC  . mometasone-formoterol  2 puff Inhalation BID  . nutrition supplement (JUVEN)  1 packet Oral BID BM  . pregabalin  300 mg Oral BID  . traZODone  150 mg Oral QHS    Continuous Infusions: . aztreonam Stopped (01/31/18 0109)  . lactated ringers 10 mL/hr at 01/30/18 2000  . linezolid (ZYVOX) IV Stopped (01/30/18 2335)  . metronidazole Stopped (01/31/18 0215)     Time spent: 25mins, case discussed with general surgery I have personally reviewed and interpreted on  01/31/2018 daily labs, tele strips, imagings as discussed above under date review session and assessment and plans.  I reviewed all nursing notes, pharmacy notes, consultant notes,  vitals, pertinent old records  I have discussed plan of care as described above with RN , patient and family on 01/31/2018   Florencia Reasons MD, PhD  [facility] Pager [phone]. If 7PM-7AM, please contact night-coverage at [email or web address], password [facility] 01/31/2018, 8:22 AM  LOS: 12 days

## 2018-01-31 NOTE — Progress Notes (Signed)
2 Days Post-Op      Subjective: In bed, and stable,ooked at the wound with Hydrotherapy, it's pretty clean no new drainage or necrosis.    Objective: Vital signs in last 24 hours: Temp:  [97.7 F (36.5 C)-99.1 F (37.3 C)] 97.7 F (36.5 C) (07/24 0739) Pulse Rate:  [76-85] 81 (07/24 0739) Resp:  [14-19] 15 (07/24 0739) BP: (113-142)/(64-72) 137/72 (07/24 0739) SpO2:  [96 %-99 %] 98 % (07/24 0739) Weight:  [146.5 kg (322 lb 15.6 oz)] 146.5 kg (322 lb 15.6 oz) (07/24 0500) Last BM Date: 01/28/18 960  PO 5.1 liters IV recorded ???? Urine 750 recorded Afebrile, VSS WBC still up, Glucose 147 - 247 Creatinine is OK Intake/Output from previous day: 07/23 0701 - 07/24 0700 In: 6130.9 [P.O.:960; I.V.:2497.9; IV Piggyback:2672.9] Out: 750 [Urine:750] Intake/Output this shift: No intake/output data recorded.  General appearance: alert, cooperative and no distress Skin: open site is clean and now new necrosis or drainage from the site.  I showed the staff where we had tracking and we measured it to about 7 cm.  Lab Results:  Recent Labs    01/30/18 0432 01/31/18 0219  WBC 12.6* 12.5*  HGB 8.2* 8.5*  HCT 27.6* 28.8*  PLT 296 279    BMET Recent Labs    01/30/18 0432 01/31/18 0219  NA 136 138  K 4.8 5.0  CL 105 106  CO2 24 27  GLUCOSE 94 237*  BUN 14 12  CREATININE 0.87 0.81  CALCIUM 8.0* 8.0*   PT/INR No results for input(s): LABPROT, INR in the last 72 hours.  No results for input(s): AST, ALT, ALKPHOS, BILITOT, PROT, ALBUMIN in the last 168 hours.   Lipase     Component Value Date/Time   LIPASE 82 (H) 07/19/2017 1942     Medications: . acetaminophen  650 mg Oral Q6H  . busPIRone  30 mg Oral BID  . famotidine  20 mg Oral Daily  . heparin  5,000 Units Subcutaneous Q8H  . insulin regular human CONCENTRATED  170-370 Units Subcutaneous BID WC  . mometasone-formoterol  2 puff Inhalation BID  . nutrition supplement (JUVEN)  1 packet Oral BID BM  .  pregabalin  300 mg Oral BID  . traZODone  150 mg Oral QHS   . aztreonam Stopped (01/31/18 0109)  . lactated ringers 10 mL/hr at 01/30/18 2000  . linezolid (ZYVOX) IV Stopped (01/30/18 2335)  . metronidazole Stopped (01/31/18 0215)   Anti-infectives (From admission, onward)   Start     Dose/Rate Route Frequency Ordered Stop   01/25/18 1530  linezolid (ZYVOX) IVPB 600 mg    Note to Pharmacy:  Have stopped fluoxetine   600 mg 300 mL/hr over 60 Minutes Intravenous Every 12 hours 01/25/18 1437     01/19/18 1800  vancomycin (VANCOCIN) 1,500 mg in sodium chloride 0.9 % 500 mL IVPB  Status:  Discontinued     1,500 mg 250 mL/hr over 120 Minutes Intravenous Every 24 hours 01/19/18 1639 01/25/18 1437   01/19/18 1700  aztreonam (AZACTAM) 1 g in sodium chloride 0.9 % 100 mL IVPB     1 g 200 mL/hr over 30 Minutes Intravenous Every 8 hours 01/19/18 1639     01/19/18 1700  metroNIDAZOLE (FLAGYL) IVPB 500 mg     500 mg 100 mL/hr over 60 Minutes Intravenous Every 8 hours 01/19/18 1639        Assessment/Plan Uncontrolled type 2 diabetes mellitus Acute on chronic kidney injury Elevated hCG COPD/obstructive sleep  apnea Chronic diastolic congestive heart failure DNR Body mass index is 55.44     Necrotizing soft tissue infection in the mons and vulva 1.  Irrigation and debridement labia/vulval area 40 cm of skin necrotic fat debrided sharply with a scalpel 01/22/2018, Dr. Coralie Keens 2.  Excision normal debridement of valvular necrotizing soft tissue infection(72 cm) by scalpel, Application ofAcell micro matrix powder x2(1000mg  x2), Application of 10 cm x 15 cm 3 layerCytal Acell sheet, Application of 7 cm x 10 cm 3 layer Cytal Acell sheet, Pulsatile lavage, 01/26/2018 Dr. 02/08/2018 3.  Irrigation and debridement of the Vulva, 01/29/2018, Dr. 02/11/2018 [place]  FEN: IV fluids/advancing diet ID:  aztreonam 7/12 =>> day 12   Linezolid 7/18 =>> day 7,  Flagyl 7/12 =>> day 12         Vancomycin 7/12 - 01/25/18  DVT: SCD/heparin Follow-up: To be determined  Plan:  Ongoing wound care and debridement.      LOS: 12 days    Yona Kosek 01/31/2018 [phone]

## 2018-01-31 NOTE — Progress Notes (Signed)
RT at bedside to ask pt about CPAP.  Pt stated she doesn't want to wear CPAP.  Pt stated she has one at home but doesn't wear it at home either.

## 2018-02-01 LAB — BASIC METABOLIC PANEL
Anion gap: 6 (ref 5–15)
BUN: 14 mg/dL (ref 6–20)
CO2: 28 mmol/L (ref 22–32)
Calcium: 8.3 mg/dL — ABNORMAL LOW (ref 8.9–10.3)
Chloride: 104 mmol/L (ref 98–111)
Creatinine, Ser: 0.85 mg/dL (ref 0.44–1.00)
GFR calc Af Amer: >60 mL/min (ref >60)
GFR calc non Af Amer: >60 mL/min (ref >60)
Glucose, Bld: 150 mg/dL — ABNORMAL HIGH (ref 70–99)
Potassium: 5 mmol/L (ref 3.5–5.1)
Sodium: 138 mmol/L (ref 135–145)

## 2018-02-01 LAB — GLUCOSE, CAPILLARY
Glucose-Capillary: 208 mg/dL — ABNORMAL HIGH (ref 70–99)
Glucose-Capillary: 170 mg/dL — ABNORMAL HIGH (ref 70–99)
Glucose-Capillary: 146 mg/dL — ABNORMAL HIGH (ref 70–99)
Glucose-Capillary: 182 mg/dL — ABNORMAL HIGH (ref 70–99)
Glucose-Capillary: 233 mg/dL — ABNORMAL HIGH (ref 70–99)
Glucose-Capillary: 230 mg/dL — ABNORMAL HIGH (ref 70–99)

## 2018-02-01 LAB — CBC
HCT: 28.9 % — ABNORMAL LOW (ref 36.0–46.0)
Hemoglobin: 8.4 g/dL — ABNORMAL LOW (ref 12.0–15.0)
MCH: 26.4 pg (ref 26.0–34.0)
MCHC: 29.1 g/dL — ABNORMAL LOW (ref 30.0–36.0)
MCV: 90.9 fL (ref 78.0–100.0)
Platelets: 271 10*3/uL (ref 150–400)
RBC: 3.18 MIL/uL — ABNORMAL LOW (ref 3.87–5.11)
RDW: 18.9 % — ABNORMAL HIGH (ref 11.5–15.5)
WBC: 10 10*3/uL (ref 4.0–10.5)

## 2018-02-01 MED ORDER — FUROSEMIDE 10 MG/ML IJ SOLN
40.0000 mg | Freq: Once | INTRAMUSCULAR | Status: AC
  Administered 2018-02-01: 40 mg via INTRAVENOUS
  Filled 2018-02-01: qty 4

## 2018-02-01 NOTE — Progress Notes (Signed)
Due to some residual erythema on her mons, we recommend another 7 days of abx therapy before discontinuation.  [name] 3:43 PM 02/01/2018

## 2018-02-01 NOTE — Progress Notes (Signed)
Physical Therapy Treatment Patient Details Name: [name] MRN: [id number] DOB: 12/27/1970 Today's Date: 02/01/2018    History of Present Illness Pt is a 47 y.o. female presenting with necrotizing soft tissue infection of mons pubis/vulva and secondary sepsis with encephalopathy. Pt s/p I&D labial/vulvar area on 7/15, 7/19, 7/22. PMHx: COPD, Morbid obesity, COPD, Seizure disorider, Bipolar disorider, Sleep apnea, Essential HTN, Diabetes, IBS, CKD.Marland Kitchen Pt underwent I&D of vulvar wound on monday 7/22.    PT Comments    Pt very pleasant and oriented to month, situation and place but not day. Pt able to follow all commands today and progressing with standing and activity tolerance. Pt educated for bil LE HEP and encouraged to continue to attempt throughout the day. Pt also educated for need for assist with staff for mobility as she stated she attempted to have spouse assist. Will continue to follow.     Follow Up Recommendations  SNF;Supervision/Assistance - 24 hour     Equipment Recommendations  Rolling walker with 5" wheels    Recommendations for Other Services       Precautions / Restrictions Precautions Precautions: Fall Precaution Comments: open vulvar wound, 2LO2 via [place]    Mobility  Bed Mobility Overal bed mobility: Needs Assistance Bed Mobility: Rolling;Sidelying to Sit;Sit to Supine Rolling: Min assist Sidelying to sit: Mod assist   Sit to supine: Mod assist;+2 for physical assistance   General bed mobility comments: pt able to roll bil with rail with cues for sequence, mod assist to elevate trunk and pivot hips to EOB. With return to supine cues for propping on RUE with assist to bring legs into bed  Transfers Overall transfer level: Needs assistance   Transfers: Sit to/from Stand Sit to Stand: Mod assist;+2 physical assistance         General transfer comment: pt able to stand x 2 trials from bed today with initial mod x 2 to stand then min x 2 on second trial.  Pt stood 70 sec then 40 sec and declined further due to pain Pt able to sidestep 4 steps toward [facility] with RW and sequential cues  Ambulation/Gait             General Gait Details: side stepping to [facility] with RW   Stairs             Wheelchair Mobility    Modified Rankin (Stroke Patients Only)       Balance Overall balance assessment: Needs assistance Sitting-balance support: Single extremity supported;Feet supported Sitting balance-Leahy Scale: Poor Sitting balance - Comments: min assist for balance due to posterior lean   Standing balance support: Bilateral upper extremity supported Standing balance-Leahy Scale: Poor                              Cognition Arousal/Alertness: Awake/alert Behavior During Therapy: WFL for tasks assessed/performed   Area of Impairment: Problem solving;Safety/judgement;Memory                     Memory: Decreased short-term memory   Safety/Judgement: Decreased awareness of safety;Decreased awareness of deficits Awareness: Emergent Problem Solving: Slow processing;Difficulty sequencing;Requires verbal cues;Requires tactile cues        Exercises General Exercises - Lower Extremity Short Arc Quad: AROM;10 reps;Both;Supine Heel Slides: AROM;10 reps;Both;Supine    General Comments        Pertinent Vitals/Pain Faces Pain Scale: Hurts even more Pain Location: groin with sitting Pain Descriptors / Indicators:  Grimacing;Aching;Pressure Pain Intervention(s): Limited activity within patient's tolerance;Repositioned;Monitored during session    Home Living                      Prior Function            PT Goals (current goals can now be found in the care plan section) Progress towards PT goals: Progressing toward goals    Frequency           PT Plan Current plan remains appropriate    Co-evaluation              AM-PAC PT "6 Clicks" Daily Activity  Outcome Measure  Difficulty  turning over in bed (including adjusting bedclothes, sheets and blankets)?: Unable Difficulty moving from lying on back to sitting on the side of the bed? : Unable Difficulty sitting down on and standing up from a chair with arms (e.g., wheelchair, bedside commode, etc,.)?: Unable Help needed moving to and from a bed to chair (including a wheelchair)?: A Lot Help needed walking in hospital room?: Total Help needed climbing 3-5 steps with a railing? : Total 6 Click Score: 7    End of Session Equipment Utilized During Treatment: Gait belt Activity Tolerance: Patient tolerated treatment well Patient left: in bed;with call bell/phone within reach Nurse Communication: Mobility status PT Visit Diagnosis: Unsteadiness on feet (R26.81);Other abnormalities of gait and mobility (R26.89);Muscle weakness (generalized) (M62.81);History of falling (Z91.81)     Time: 3154-0086 PT Time Calculation (min) (ACUTE ONLY): 23 min  Charges:  $Therapeutic Exercise: 8-22 mins $Therapeutic Activity: 8-22 mins                     [address], [place]    Sandy Salaam Angela Platner 02/01/2018, 12:25 PM

## 2018-02-01 NOTE — Progress Notes (Signed)
PROGRESS NOTE  Rhyanna Sorce HBZ:[id number] DOB: 49-05-1971 DOA: 01/19/2018 PCP: Pleas Koch, NP  HPI/Recap of past 24 hours:  Feeling better, denies pain No fever Husband at bedside  Assessment/Plan: Principal Problem:   Necrotizing soft tissue infection Active Problems:   Morbid (severe) obesity due to excess calories (HCC)   Tobacco abuse   COPD GOLD 0    Seizure disorder (HCC)   Bipolar disorder (HCC)   Sleep apnea   Essential hypertension   Diabetes (HCC)   IBS (irritable bowel syndrome)   Sepsis (HCC)   Elevated serum hCG   Open wound of genital labia   Vulvar abscess   Poorly controlled type 2 diabetes mellitus ([place])   DNR (do not resuscitate)   CKD (chronic kidney disease), stage III ([place])   Pressure injury of skin   Necrotizing soft tissue infection of Mons/ vulva with sepsis presented on admission S/p Irrigation and debridement labia/vulval area 40 cm of skin necrotic fat debrided sharply with a scalpel 01/22/2018,Dr. Coralie Keens S/p Excision normal debridement of valvular necrotizing soft tissue infection(72 cm)by scalpel,Application ofAcell micro matrix powder x2(1000mg  x2), Application of 10 cm x 15 cm 3 layerCytal Acell sheet,Application of 7 cm x 10 cm 3 layer Cytal Acell sheet,Pulsatile lavage,01/26/2018 Dr. 02/08/2018 S/p Irrigation and debridement ofthe Vulva,01/29/2018, Dr. 02/11/2018  -pcn allergic, Patient is started onIV aztreonam / metronidazole/vancomycin. vanc discontinued due to  VRE in wound culture, she is started on zyvox since 7/18, she is continued on aztreonam and flagyl - my hospitalist colleague Dr 8/18 discussed with ID. D/c vanc and started linezolid on 7/18. Stopped SSRI. -wbc normalized, lactic acidosis resolved, general surgery recommended 7 more days of abx from 7/25 due to residual erythema on her mons -start hydrotherapy -ok to d/c to LTAC /SNF and continue wound care and hydrotherapy from  general surgery stand point, case manager consulted  Uncontrolled type 2 diabetes mellitus: -a1c 10.4 -metformin presently remains on hold - currently on high dose BID with SSI coverage -U500 has been continued. Glucose trends stable  Acute on chronic kidney injury on CKDII:  -Bun/cr 60/2.18 on presentation -Cr baseline is 1.4- 1.8 . Cr now at baseline -diuretics on hold (metolazone, torsemide and Aldactone) since admission, will give one dose of iv lasix today on 7/25 due to some lower extremity  edeam - have stopped NSAIDs -Labs reviewed. Renal function remains stable  elevated HCG: Reportedly has not had sexual intercourse for past 4-5 years follow clinically  -Recommend close outpatient follow up  COPD / OSA: on home 02 for years - sees pulm in office for COPD goldstage 0 w nocturnal hypoxemia (on noct O2 at 3L as of sept 2018 office visit) - Supportive care, p.r.n. Albuterol, on symbicort at home -no wheezing currently -Patient has been refusing CPAP at night -Recent ABG unremarkabe without evidence of hypercarbia -Currently on minimal O2 support Body mass index is 56.5 kg/m. meet morbid obesity criteria.   Chronic diastolic CHF:  - admitted here by cardiology once in 2017 and once late 2018 for dCHF exacerbation - diuretics on hold in view of sepsis -started to have edema, will give iv lasix x1 today on 7/25, reeval in am  NSVT on tele, asymptomatic She has h/o allergy to betablocker and ccb  DNR: Seen by palliative care 7/15, see their notes, remains DNR and no re-intubation Patient was seen by palliative care 7/15, patient wants the following: - DNR - No trach - Doesnot wantto be re-intubated.  -  No feeding tubes - Not keen on dialysis;not a good long term candidateregardlessgiven co-morbidities and overall weakness  DVT prophylaxis: Heparin subq Code Status:  DNR Family Communication: Pt in room, husband at bedside Disposition Plan: LTAC vs snf to continue wound care/hydrotherapy  Consultants:   Palliative care  General Surgery  Phone conversation with infectious disease by Dr Wyline Copas  Procedures:  -SP I&D labial/ vulvar area 01/22/18 by Gen Surgery -Surgical debridement 7/19 -Surgical debridement 7/22   Antibiotics:  As above   Objective: BP 134/69 (BP Location: Left Arm)   Pulse 85   Temp 98 F (36.7 C) (Oral)   Resp 10   Ht 5\' 4"  (1.626 m)   Wt (!) 149.3 kg (329 lb 2.4 oz)   LMP 11/11/2011   SpO2 98%   BMI 56.50 kg/m   Intake/Output Summary (Last 24 hours) at 02/01/2018 1210 Last data filed at 02/01/2018 0920 Gross per 24 hour  Intake 1415.08 ml  Output 800 ml  Net 615.08 ml   Filed Weights   01/30/18 0359 01/31/18 0500 02/01/18 0500  Weight: (!) 146.4 kg (322 lb 12.1 oz) (!) 146.5 kg (322 lb 15.6 oz) (!) 149.3 kg (329 lb 2.4 oz)    Exam: Patient is examined daily including today on 02/01/2018, exams remain the same as of yesterday except that has changed    General:  NAD, awake and interactive, obese  Cardiovascular: RRR  Respiratory: diminished at base  Abdomen: Soft/ND/NT, positive BS, left pubic mons dressing intact  Musculoskeletal: trace bilateral pedal Edema  Neuro: alert, oriented   Data Reviewed: Basic Metabolic Panel: Recent Labs  Lab 01/28/18 0223 01/29/18 0316 01/30/18 0432 01/31/18 0219 02/01/18 0314  NA 142 139 136 138 138  K 4.4 5.0 4.8 5.0 5.0  CL 107 105 105 106 104  CO2 28 27 24 27 28   GLUCOSE 80 206* 94 237* 150*  BUN 21* 15 14 12 14   CREATININE 0.89 0.80 0.87 0.81 0.85  CALCIUM 8.5* 8.1* 8.0* 8.0* 8.3*   Liver Function Tests: No results for input(s): AST, ALT, ALKPHOS, BILITOT, PROT, ALBUMIN in the last 168 hours. No results for input(s): LIPASE, AMYLASE in the last 168 hours. No results for input(s): AMMONIA in the last 168 hours. CBC: Recent Labs  Lab  01/28/18 0223 01/29/18 0316 01/30/18 0432 01/31/18 0219 02/01/18 0314  WBC 13.3* 13.4* 12.6* 12.5* 10.0  HGB 9.8* 9.4* 8.2* 8.5* 8.4*  HCT 33.2* 31.3* 27.6* 28.8* 28.9*  MCV 89.5 89.2 90.2 90.6 90.9  PLT 299 312 296 279 271   Cardiac Enzymes:   No results for input(s): CKTOTAL, CKMB, CKMBINDEX, TROPONINI in the last 168 hours. BNP (last 3 results) Recent Labs    03/14/17 1840 07/20/17 2053 10/19/17 2148  BNP 103.4* 29.6 54.6    ProBNP (last 3 results) No results for input(s): PROBNP in the last 8760 hours.  CBG: Recent Labs  Lab 01/31/18 0740 01/31/18 1210 01/31/18 1617 01/31/18 2131 02/01/18 0754  GLUCAP 247* 208* 198* 170* 146*    Recent Results (from the past 240 hour(s))  Surgical pcr screen     Status: None   Collection Time: 01/22/18  1:27 PM  Result Value Ref Range Status   MRSA, PCR NEGATIVE NEGATIVE Final   Staphylococcus aureus NEGATIVE NEGATIVE Final    Comment: (NOTE) The Xpert SA Assay (FDA approved for NASAL specimens in patients 38 years of age and older), is one component of a comprehensive surveillance program. It is not intended to diagnose  infection nor to guide or monitor treatment. Performed at [facility], [place] [address] [place], [place] [address]   Aerobic/Anaerobic Culture (surgical/deep wound)     Status: None   Collection Time: 01/22/18  6:49 PM  Result Value Ref Range Status   Specimen Description ABSCESS LABIA  Final   Special Requests PATIENT ON FOLLOWING VANC FLAGYL AZTREONAM  Final   Gram Stain   Final    MODERATE WBC PRESENT, PREDOMINANTLY PMN ABUNDANT GRAM NEGATIVE RODS ABUNDANT GRAM POSITIVE COCCI    Culture   Final    RARE ENTEROCOCCUS CASSELIFLAVUS FEW STREPTOCOCCUS GROUP C NO ANAEROBES ISOLATED Performed at [facility], [place] [address], [place], [place] [address]    Report Status 01/27/2018 FINAL  Final   Organism ID, Bacteria ENTEROCOCCUS CASSELIFLAVUS  Final      Susceptibility   Enterococcus  casseliflavus - MIC*    AMPICILLIN <=2 SENSITIVE Sensitive     VANCOMYCIN RESISTANT Resistant     GENTAMICIN SYNERGY SENSITIVE Sensitive     LINEZOLID 2 SENSITIVE Sensitive     * RARE ENTEROCOCCUS CASSELIFLAVUS  Culture, Urine     Status: Abnormal   Collection Time: 01/22/18  8:21 PM  Result Value Ref Range Status   Specimen Description URINE, CLEAN CATCH  Final   Special Requests   Final    NONE Performed at [facility], [place] [address], [place], [place] [address]    Culture MULTIPLE SPECIES PRESENT, SUGGEST RECOLLECTION (A)  Final   Report Status 01/25/2018 FINAL  Final     Studies: No results found.  Scheduled Meds: . acetaminophen  650 mg Oral Q6H  . busPIRone  30 mg Oral BID  . famotidine  20 mg Oral Daily  . heparin  5,000 Units Subcutaneous Q8H  . insulin regular human CONCENTRATED  170-370 Units Subcutaneous BID WC  . mometasone-formoterol  2 puff Inhalation BID  . nutrition supplement (JUVEN)  1 packet Oral BID BM  . pregabalin  300 mg Oral BID  . traZODone  150 mg Oral QHS    Continuous Infusions: . aztreonam 1 g (02/01/18 1044)  . lactated ringers 10 mL/hr at 01/30/18 2000  . linezolid (ZYVOX) IV 600 mg (02/01/18 1149)  . metronidazole 500 mg (02/01/18 0920)     Time spent: 57mins, case discussed with general surgery I have personally reviewed and interpreted on  02/01/2018 daily labs, tele strips, imagings as discussed above under date review session and assessment and plans.  I reviewed all nursing notes, pharmacy notes, consultant notes,  vitals, pertinent old records  I have discussed plan of care as described above with RN , patient and family on 02/01/2018   Florencia Reasons MD, PhD  [facility] Pager [phone]. If 7PM-7AM, please contact night-coverage at [email or web address], password [facility] 02/01/2018, 12:10 PM  LOS: 13 days

## 2018-02-01 NOTE — Progress Notes (Signed)
Physical Therapy Wound Treatment Patient Details  Name: [name] MRN: [id number] Date of Birth: 12-Jul-1970  Today's Date: 02/01/2018 Time: 1340-1440 Time Calculation (min): 60 min  Subjective  Subjective: Pt concerned about IV medication vs oral Patient and Family Stated Goals: get wound better Prior Treatments: I&D in OR x 3  Pain Score: Pt with 8/10 pain with pulsed lavage despite IV pain medication.   Wound Assessment  Wound / Incision (Open or Dehisced) 01/21/18 Non-pressure wound Labia Left open area due to cellulitis (Active)  Wound Image   01/31/2018 10:36 AM  Dressing Type ABD;Gauze (Comment);Moist to dry 02/01/2018  5:00 PM  Dressing Changed Changed 02/01/2018  5:00 PM  Dressing Status Clean;Dry;Intact 02/01/2018  5:00 PM  Dressing Change Frequency Twice a day 02/01/2018  5:00 PM  Site / Wound Assessment Pink;Red;Yellow 02/01/2018  5:00 PM  % Wound base Red or Granulating 80% 02/01/2018  5:00 PM  % Wound base Yellow/Fibrinous Exudate 20% 02/01/2018  5:00 PM  % Wound base Black/Eschar 0% 02/01/2018  5:00 PM  % Wound base Other/Granulation Tissue (Comment) 0% 02/01/2018  5:00 PM  Peri-wound Assessment Edema;Erythema (blanchable) 02/01/2018  5:00 PM  Wound Length (cm) 18 cm 01/31/2018 10:36 AM  Wound Width (cm) 6.8 cm 01/31/2018 10:36 AM  Wound Depth (cm) 5 cm 01/31/2018 10:36 AM  Wound Volume (cm^3) 612 cm^3 01/31/2018 10:36 AM  Wound Surface Area (cm^2) 122.4 cm^2 01/31/2018 10:36 AM  Tunneling (cm) 7 cm at lower central part of wound 01/31/2018 10:36 AM  Margins Unattached edges (unapproximated) 02/01/2018  5:00 PM  Closure None 02/01/2018  5:00 PM  Drainage Amount Moderate 02/01/2018  5:00 PM  Drainage Description Purulent;Serous 02/01/2018  5:00 PM  Treatment Debridement (Selective);Hydrotherapy (Pulse lavage);Packing (Saline gauze) 02/01/2018  5:00 PM      Hydrotherapy Pulsed lavage therapy - wound location: L labia/vulva Pulsed Lavage with Suction (psi): 8 psi Pulsed Lavage with  Suction - Normal Saline Used: 1000 mL Pulsed Lavage Tip: Tip with splash shield Selective Debridement Selective Debridement - Location: L labia/vulva Selective Debridement - Tools Used: Forceps;Scissors Selective Debridement - Tissue Removed: Yellow and brown unviable tissue   Wound Assessment and Plan  Wound Therapy - Assess/Plan/Recommendations Wound Therapy - Clinical Statement: Pt with increased pain today with hydrotherapy. Pt received IV pain medication and feels that her pain was less with oral medicatio. Will make sure to have oral meds on board before treatment tomorrow. Pt will continue to benefit from hydrotherapy and selective debridement of necrotic tissue to decrease bioburden and promote healing.  Wound Therapy - Functional Problem List: Decr mobility and activity Factors Delaying/Impairing Wound Healing: Diabetes Mellitus;Immobility;Multiple medical problems Hydrotherapy Plan: Debridement;Dressing change;Patient/family education;Pulsatile lavage with suction Wound Therapy - Frequency: 6X / week Wound Therapy - Follow Up Recommendations: Skilled nursing facility Wound Plan: See above  Wound Therapy Goals- Improve the function of patient's integumentary system by progressing the wound(s) through the phases of wound healing (inflammation - proliferation - remodeling) by: Decrease Necrotic Tissue to: 5 Decrease Necrotic Tissue - Progress: Progressing toward goal Increase Granulation Tissue to: 95 Increase Granulation Tissue - Progress: Progressing toward goal Goals/treatment plan/discharge plan were made with and agreed upon by patient/family: Yes Time For Goal Achievement: 7 days Wound Therapy - Potential for Goals: Good  Goals will be updated until maximal potential achieved or discharge criteria met.  Discharge criteria: when goals achieved, discharge from hospital, MD decision/surgical intervention, no progress towards goals, refusal/missing three consecutive treatments  without notification or medical reason.  GP    [name]. [name] PT, DPT Acute Rehabilitation  [phone] Pager [phone]   [place] 02/01/2018, 5:46 PM

## 2018-02-01 NOTE — Progress Notes (Signed)
3 Days Post-Op      Subjective: She is doing well, less swelling and no purulent drainage noted now.  Erythema is improved.  PT is getting her up and she is able to move more now.    Objective: Vital signs in last 24 hours: Temp:  [97.8 F (36.6 C)-98 F (36.7 C)] 98 F (36.7 C) (07/25 0756) Pulse Rate:  [73-87] 85 (07/25 0756) Resp:  [7-17] 17 (07/25 0756) BP: (118-134)/(55-73) 118/72 (07/25 0756) SpO2:  [96 %-98 %] 96 % (07/25 0756) Weight:  [149.3 kg (329 lb 2.4 oz)] 149.3 kg (329 lb 2.4 oz) (07/25 0500) Last BM Date: 01/31/18 720 PO 1199 IV 1300 urine Afebrile, VSS Labs OK, WBC continue to improve   Intake/Output from previous day: 07/24 0701 - 07/25 0700 In: 1919.9 [P.O.:720; IV Piggyback:1199.9] Out: 1300 [Urine:1300] Intake/Output this shift: No intake/output data recorded.  General appearance: alert, cooperative and no distress Skin: Skin color, texture, turgor normal. No rashes or lesions or Groin area looks better, no further purulent drainage noted, no further tracking, less swelling and edema.    Lab Results:  Recent Labs    01/31/18 0219 02/01/18 0314  WBC 12.5* 10.0  HGB 8.5* 8.4*  HCT 28.8* 28.9*  PLT 279 271    BMET Recent Labs    01/31/18 0219 02/01/18 0314  NA 138 138  K 5.0 5.0  CL 106 104  CO2 27 28  GLUCOSE 237* 150*  BUN 12 14  CREATININE 0.81 0.85  CALCIUM 8.0* 8.3*   PT/INR No results for input(s): LABPROT, INR in the last 72 hours.  No results for input(s): AST, ALT, ALKPHOS, BILITOT, PROT, ALBUMIN in the last 168 hours.   Lipase     Component Value Date/Time   LIPASE 82 (H) 07/19/2017 1942     Medications: . acetaminophen  650 mg Oral Q6H  . busPIRone  30 mg Oral BID  . famotidine  20 mg Oral Daily  . heparin  5,000 Units Subcutaneous Q8H  . insulin regular human CONCENTRATED  170-370 Units Subcutaneous BID WC  . mometasone-formoterol  2 puff Inhalation BID  . nutrition supplement (JUVEN)  1 packet Oral BID BM   . pregabalin  300 mg Oral BID  . traZODone  150 mg Oral QHS    Assessment/Plan Uncontrolled type 2 diabetes mellitus Acute on chronic kidney injury Elevated hCG COPD/obstructive sleep apnea Chronic diastolic congestive heart failure DNR Body mass index is 55.44     Necrotizing soft tissue infection in the mons and vulva 1.Irrigation and debridement labia/vulval area 40 cm of skin necrotic fat debrided sharply with a scalpel 01/22/2018,Dr. Coralie Keens 2.Excision normal debridement of valvular necrotizing soft tissue infection(72 cm)by scalpel,Application ofAcell micro matrix powder x2(1000mg  x2), Application of 10 cm x 15 cm 3 layerCytal Acell sheet,Application of 7 cm x 10 cm 3 layer Cytal Acell sheet,Pulsatile lavage,01/26/2018 Dr. 02/08/2018 3.Irrigation and debridement ofthe Vulva,01/29/2018, Dr. 02/11/2018 [place]  FEN: IV fluids/advancing diet Marland Kitchen 12 Linezolid 7/18 =>>day 7, Flagyl 7/12=>>day 12 Vancomycin 7/12 - 01/25/18 DVT: SCD/heparin Follow-up: To be determined  Plan:  Dr. 02/07/18 thinks we can go forward with placement.  She will need ongoing active wound care with hydrotherapy and dressing changes BID.          LOS: 13 days    Kavion Mancinas 02/01/2018 [phone]

## 2018-02-01 NOTE — Discharge Instructions (Signed)
Normal saline wet to dry dressing changes with Kerlix gauze to wound BID

## 2018-02-02 LAB — CBC
HCT: 28.4 % — ABNORMAL LOW (ref 36.0–46.0)
Hemoglobin: 8.4 g/dL — ABNORMAL LOW (ref 12.0–15.0)
MCH: 27 pg (ref 26.0–34.0)
MCHC: 29.6 g/dL — ABNORMAL LOW (ref 30.0–36.0)
MCV: 91.3 fL (ref 78.0–100.0)
Platelets: 262 10*3/uL (ref 150–400)
RBC: 3.11 MIL/uL — ABNORMAL LOW (ref 3.87–5.11)
RDW: 19.1 % — ABNORMAL HIGH (ref 11.5–15.5)
WBC: 10.1 10*3/uL (ref 4.0–10.5)

## 2018-02-02 LAB — BASIC METABOLIC PANEL
Anion gap: 7 (ref 5–15)
BUN: 12 mg/dL (ref 6–20)
CO2: 29 mmol/L (ref 22–32)
Calcium: 8.4 mg/dL — ABNORMAL LOW (ref 8.9–10.3)
Chloride: 102 mmol/L (ref 98–111)
Creatinine, Ser: 0.97 mg/dL (ref 0.44–1.00)
GFR calc Af Amer: >60 mL/min (ref >60)
GFR calc non Af Amer: >60 mL/min (ref >60)
Glucose, Bld: 123 mg/dL — ABNORMAL HIGH (ref 70–99)
Potassium: 4.8 mmol/L (ref 3.5–5.1)
Sodium: 138 mmol/L (ref 135–145)

## 2018-02-02 LAB — MAGNESIUM: Magnesium: 2.1 mg/dL (ref 1.7–2.4)

## 2018-02-02 LAB — GLUCOSE, CAPILLARY
Glucose-Capillary: 92 mg/dL (ref 70–99)
Glucose-Capillary: 114 mg/dL — ABNORMAL HIGH (ref 70–99)
Glucose-Capillary: 123 mg/dL — ABNORMAL HIGH (ref 70–99)
Glucose-Capillary: 182 mg/dL — ABNORMAL HIGH (ref 70–99)

## 2018-02-02 MED ORDER — PRO-STAT SUGAR FREE PO LIQD
30.0000 mL | Freq: Two times a day (BID) | ORAL | Status: DC
  Administered 2018-02-02 – 2018-02-05 (×6): 30 mL via ORAL
  Filled 2018-02-02 (×6): qty 30

## 2018-02-02 MED ORDER — GLUCERNA SHAKE PO LIQD
237.0000 mL | Freq: Two times a day (BID) | ORAL | Status: DC
  Administered 2018-02-03 – 2018-02-04 (×4): 237 mL via ORAL

## 2018-02-02 MED ORDER — ADULT MULTIVITAMIN W/MINERALS CH
1.0000 | ORAL_TABLET | Freq: Every day | ORAL | Status: DC
  Administered 2018-02-02 – 2018-02-05 (×4): 1 via ORAL
  Filled 2018-02-02 (×4): qty 1

## 2018-02-02 NOTE — Progress Notes (Signed)
PROGRESS NOTE  [name] UTM:[id number] DOB: July 02, 1971 DOA: 01/19/2018 PCP: Pleas Koch, NP  HPI/Recap of past 24 hours:  C/o pain with hydrotherapy, now she is drowsy after prn pain meds No fever   Assessment/Plan: Principal Problem:   Necrotizing soft tissue infection Active Problems:   Morbid (severe) obesity due to excess calories (HCC)   Tobacco abuse   COPD GOLD 0    Seizure disorder (HCC)   Bipolar disorder (HCC)   Sleep apnea   Essential hypertension   Diabetes (HCC)   IBS (irritable bowel syndrome)   Sepsis (HCC)   Elevated serum hCG   Open wound of genital labia   Vulvar abscess   Poorly controlled type 2 diabetes mellitus ([place])   DNR (do not resuscitate)   CKD (chronic kidney disease), stage III ([place])   Pressure injury of skin   Necrotizing soft tissue infection of Mons/ vulva with sepsis presented on admission S/p Irrigation and debridement labia/vulval area 40 cm of skin necrotic fat debrided sharply with a scalpel 01/22/2018,Dr. Coralie Keens S/p Excision normal debridement of valvular necrotizing soft tissue infection(72 cm)by scalpel,Application ofAcell micro matrix powder x2(1000mg  x2), Application of 10 cm x 15 cm 3 layerCytal Acell sheet,Application of 7 cm x 10 cm 3 layer Cytal Acell sheet,Pulsatile lavage,01/26/2018 Dr. 02/08/2018 S/p Irrigation and debridement ofthe Vulva,01/29/2018, Dr. 02/11/2018  -pcn allergic, Patient is started onIV aztreonam / metronidazole/vancomycin. vanc discontinued due to  VRE in wound culture, she is started on zyvox since 7/18, she is continued on aztreonam and flagyl - my hospitalist colleague Dr 8/18 discussed with ID. D/c vanc and started linezolid on 7/18. Stopped SSRI. I have discussed with ID Dr 8/18 on 7/26 who recommended can continue linezolid, can d/c aztreonam and flagyl. -wbc normalized, lactic acidosis resolved, general surgery recommended 7 more days of abx from  7/25 due to residual erythema on her mons -start hydrotherapy -ok to d/c to LTAC /SNF and continue wound care and hydrotherapy from general surgery stand point, case manager consulted  Uncontrolled type 2 diabetes mellitus: -a1c 10.4 -metformin presently remains on hold - currently on high dose BID with SSI coverage -U500 has been continued. Glucose trends stable  Acute on chronic kidney injury on CKDII:  -Bun/cr 60/2.18 on presentation -Cr baseline is 1.4- 1.8 . Cr now at baseline -diuretics on hold (metolazone, torsemide and Aldactone) since admission, will give one dose of iv lasix today on 7/25 due to some lower extremity  edeam - have stopped NSAIDs -Labs reviewed. Renal function remains stable  elevated HCG: Reportedly has not had sexual intercourse for past 4-5 years follow clinically  -Recommend close outpatient follow up  COPD / OSA: on home 02 for years - sees pulm in office for COPD goldstage 0 w nocturnal hypoxemia (on noct O2 at 3L as of sept 2018 office visit) - Supportive care, p.r.n. Albuterol, on symbicort at home -no wheezing currently -Patient has been refusing CPAP at night -Recent ABG unremarkabe without evidence of hypercarbia -Currently on minimal O2 support Body mass index is 56.57 kg/m. meet morbid obesity criteria.   Chronic diastolic CHF:  - admitted here by cardiology once in 2017 and once late 2018 for dCHF exacerbation - diuretics on hold in view of sepsis -started to have edema, will give iv lasix x1 today on 7/25 Plan to have daily lasix from 7/27, plan to resume home diuretics at discharge  NSVT on tele, asymptomatic She has h/o allergy to betablocker and ccb  DNR: Seen by palliative care 7/15, see their notes, remains DNR and no re-intubation Patient was seen by palliative care 7/15, patient wants the following: - DNR - No trach - Doesnot wantto be re-intubated.    - No feeding tubes - Not keen on dialysis;not a good long term candidateregardlessgiven co-morbidities and overall weakness  DVT prophylaxis: Heparin subq Code Status: DNR Family Communication: Pt in room, husband at bedside on 7/25 Disposition Plan: [facility] to continue wound care/hydrotherapy  Consultants:   Palliative care  General Surgery  Phone conversation with infectious disease by Dr Wyline Copas  Procedures:  -SP I&D labial/ vulvar area 01/22/18 by Gen Surgery -Surgical debridement 7/19 -Surgical debridement 7/22 hydrotherapy   Antibiotics:  As above   Objective: BP 125/69 (BP Location: Right Arm)   Pulse 85   Temp 98.3 F (36.8 C) (Oral)   Resp 16   Ht 5\' 4"  (1.626 m)   Wt (!) 149.5 kg (329 lb 9.4 oz)   LMP 11/11/2011   SpO2 99%   BMI 56.57 kg/m   Intake/Output Summary (Last 24 hours) at 02/02/2018 1031 Last data filed at 02/02/2018 1010 Gross per 24 hour  Intake 360 ml  Output 1625 ml  Net -1265 ml   Filed Weights   01/31/18 0500 02/01/18 0500 02/02/18 0647  Weight: (!) 146.5 kg (322 lb 15.6 oz) (!) 149.3 kg (329 lb 2.4 oz) (!) 149.5 kg (329 lb 9.4 oz)    Exam: Patient is examined daily including today on 02/02/2018, exams remain the same as of yesterday except that has changed    General:  NAD, awake and interactive, obese  Cardiovascular: RRR  Respiratory: diminished at base  Abdomen: Soft/ND/NT, positive BS, left pubic mons dressing intact  Musculoskeletal: trace bilateral pedal Edema  Neuro: alert, oriented   Data Reviewed: Basic Metabolic Panel: Recent Labs  Lab 01/29/18 0316 01/30/18 0432 01/31/18 0219 02/01/18 0314 02/02/18 0302  NA 139 136 138 138 138  K 5.0 4.8 5.0 5.0 4.8  CL 105 105 106 104 102  CO2 27 24 27 28 29   GLUCOSE 206* 94 237* 150* 123*  BUN 15 14 12 14 12   CREATININE 0.80 0.87 0.81 0.85 0.97  CALCIUM 8.1* 8.0* 8.0* 8.3* 8.4*  MG  --   --   --   --  2.1   Liver  Function Tests: No results for input(s): AST, ALT, ALKPHOS, BILITOT, PROT, ALBUMIN in the last 168 hours. No results for input(s): LIPASE, AMYLASE in the last 168 hours. No results for input(s): AMMONIA in the last 168 hours. CBC: Recent Labs  Lab 01/29/18 0316 01/30/18 0432 01/31/18 0219 02/01/18 0314 02/02/18 0302  WBC 13.4* 12.6* 12.5* 10.0 10.1  HGB 9.4* 8.2* 8.5* 8.4* 8.4*  HCT 31.3* 27.6* 28.8* 28.9* 28.4*  MCV 89.2 90.2 90.6 90.9 91.3  PLT 312 296 279 271 262   Cardiac Enzymes:   No results for input(s): CKTOTAL, CKMB, CKMBINDEX, TROPONINI in the last 168 hours. BNP (last 3 results) Recent Labs    03/14/17 1840 07/20/17 2053 10/19/17 2148  BNP 103.4* 29.6 54.6    ProBNP (last 3 results) No results for input(s): PROBNP in the last 8760 hours.  CBG: Recent Labs  Lab 02/01/18 0754 02/01/18 1217 02/01/18 1727 02/01/18 2133 02/02/18 0742  GLUCAP 146* 182* 233* 230* 92    No results found for this or any previous visit (from the past 240 hour(s)).   Studies: No results found.  Scheduled Meds: . acetaminophen  650 mg Oral Q6H  . busPIRone  30 mg Oral BID  . famotidine  20 mg Oral Daily  . heparin  5,000 Units Subcutaneous Q8H  . insulin regular human CONCENTRATED  170-370 Units Subcutaneous BID WC  . mometasone-formoterol  2 puff Inhalation BID  . nutrition supplement (JUVEN)  1 packet Oral BID BM  . pregabalin  300 mg Oral BID  . traZODone  150 mg Oral QHS    Continuous Infusions: . linezolid (ZYVOX) IV 600 mg (02/02/18 1022)     Time spent: 4mins, case discussed with infectious disease I have personally reviewed and interpreted on  02/02/2018 daily labs,  imagings as discussed above under date review session and assessment and plans.  I reviewed all nursing notes, pharmacy notes, consultant notes,  vitals, pertinent old records  I have discussed plan of care as described above with RN , patient on 02/02/2018   Florencia Reasons MD, PhD  [facility] Pager [phone]. If 7PM-7AM, please contact night-coverage at [email or web address], password [facility] 02/02/2018, 10:31 AM  LOS: 14 days

## 2018-02-02 NOTE — Progress Notes (Signed)
Physical Therapy Wound Treatment Patient Details  Name: Nilda Keathley MRN: [id number] Date of Birth: [birth year]  Today's Date: 02/02/2018 Time: 3244-0102 Time Calculation (min): 51 min  Subjective  Subjective: Pt feels that the oral medication is better for pain control but overall she is experiencing greater pain with dressing change. Patient and Family Stated Goals: get wound better Prior Treatments: I&D in OR x 3  Pain Score: Pt continues to have increased 7/10 pain with hydrotherapy.  Wound Assessment  Wound / Incision (Open or Dehisced) 01/21/18 Non-pressure wound Labia Left open area due to cellulitis (Active)  Wound Image   01/31/2018 10:36 AM  Dressing Type ABD;Gauze (Comment);Moist to dry 02/02/2018 10:00 AM  Dressing Changed Changed 02/02/2018 10:00 AM  Dressing Status Clean;Dry;Intact 02/02/2018 10:00 AM  Dressing Change Frequency Twice a day 02/02/2018 10:00 AM  Site / Wound Assessment Pink;Red;Yellow 02/02/2018 10:00 AM  % Wound base Red or Granulating 85% 02/02/2018 10:00 AM  % Wound base Yellow/Fibrinous Exudate 15% 02/02/2018 10:00 AM  % Wound base Black/Eschar 0% 02/02/2018 10:00 AM  % Wound base Other/Granulation Tissue (Comment) 0% 02/02/2018 10:00 AM  Peri-wound Assessment Edema;Erythema (blanchable) 02/02/2018 10:00 AM  Wound Length (cm) 18 cm 01/31/2018 10:36 AM  Wound Width (cm) 6.8 cm 01/31/2018 10:36 AM  Wound Depth (cm) 5 cm 01/31/2018 10:36 AM  Wound Volume (cm^3) 612 cm^3 01/31/2018 10:36 AM  Wound Surface Area (cm^2) 122.4 cm^2 01/31/2018 10:36 AM  Tunneling (cm) 7 cm at lower central part of wound 01/31/2018 10:36 AM  Margins Unattached edges (unapproximated) 02/02/2018 10:00 AM  Closure None 02/02/2018 10:00 AM  Drainage Amount Moderate 02/02/2018 10:00 AM  Drainage Description Serous;Serosanguineous 02/02/2018 10:00 AM  Treatment Debridement (Selective);Hydrotherapy (Pulse lavage);Packing (Saline gauze) 02/02/2018 10:00 AM     Hydrotherapy Pulsed lavage therapy - wound  location: L labia/vulva Pulsed Lavage with Suction (psi): 8 psi Pulsed Lavage with Suction - Normal Saline Used: 1000 mL Pulsed Lavage Tip: Tip with splash shield Selective Debridement Selective Debridement - Location: L labia/vulva Selective Debridement - Tools Used: Forceps;Scissors Selective Debridement - Tissue Removed: yellow necrotic material   Wound Assessment and Plan  Wound Therapy - Assess/Plan/Recommendations Wound Therapy - Clinical Statement: Pt continues to have increased pain with hydrotherapy. Pt educated that with more viable tissue there are more nerves exposed and therefore more pain is possible. Despite increased pain pt feels that the oral medication is working better. Pt will continue to benefit from hydrotherapy and selective debridement to promote wound healing.  Wound Therapy - Functional Problem List: Decr mobility and activity Factors Delaying/Impairing Wound Healing: Diabetes Mellitus;Immobility;Multiple medical problems Hydrotherapy Plan: Debridement;Dressing change;Patient/family education;Pulsatile lavage with suction Wound Therapy - Frequency: 6X / week Wound Therapy - Follow Up Recommendations: Skilled nursing facility Wound Plan: See above  Wound Therapy Goals- Improve the function of patient's integumentary system by progressing the wound(s) through the phases of wound healing (inflammation - proliferation - remodeling) by: Decrease Necrotic Tissue to: 5 Decrease Necrotic Tissue - Progress: Progressing toward goal Increase Granulation Tissue to: 95 Increase Granulation Tissue - Progress: Progressing toward goal Goals/treatment plan/discharge plan were made with and agreed upon by patient/family: Yes Time For Goal Achievement: 7 days Wound Therapy - Potential for Goals: Good  Goals will be updated until maximal potential achieved or discharge criteria met.  Discharge criteria: when goals achieved, discharge from hospital, MD decision/surgical  intervention, no progress towards goals, refusal/missing three consecutive treatments without notification or medical reason.  GP    [name]. [employer]  PT, DPT Acute Rehabilitation  [phone] Pager [phone]   [place] 02/02/2018, 10:14 AM

## 2018-02-02 NOTE — Progress Notes (Signed)
Pharmacy Antibiotic Note  [name] is a 47 y.o. female admitted on 01/19/2018 with L groin wound.  Pharmacy has been following for Aztreonam, and Flagyl dosing.  Pt is s/p  I&D on 7/19, 7/22, and 7/25 of the wound. Deep wound culture was poly microbial and revealed  Enterococcus casseliflavus which was Vanc resistant and she was changed to Linezolid therapy.  She seems to be tolerating antibiotics appropriately, afebrile, and WBCs are WNL.   Plan: Continue Metronidazole 500 mg q8h Continue Aztreonam 1 g q8h Linezolid 600 mg q12 hr Per Surgery antibiotic end date 8/1. Follow up on narrowing abx as able Monitor renal fxn, culture data, and clinical progress  Height: 5\' 4"  (162.6 cm) Weight: (!) 329 lb 9.4 oz (149.5 kg) IBW/kg (Calculated) : 54.7  Temp (24hrs), Avg:98.3 F (36.8 C), Min:98.2 F (36.8 C), Max:98.3 F (36.8 C)  Recent Labs  Lab 01/29/18 0316 01/30/18 0432 01/31/18 0219 02/01/18 0314 02/02/18 0302  WBC 13.4* 12.6* 12.5* 10.0 10.1  CREATININE 0.80 0.87 0.81 0.85 0.97    Estimated Creatinine Clearance: 102.6 mL/min (by C-G formula based on SCr of 0.97 mg/dL).    Allergies  Allergen Reactions  . Adhesive [Tape] Rash and Other (See Comments)    TAKES OFF THE SKIN (CERTAIN MEDICAL TAPES DO THIS!!)  . Metoprolol Shortness Of Breath    Occurrence of shortness of breath after 3 days  . Montelukast Shortness Of Breath  . Morphine Sulfate Anaphylaxis, Shortness Of Breath and Nausea And Vomiting    Swollen Throat - Able to tolerate dilaudid  . Penicillins Anaphylaxis, Hives and Shortness Of Breath    Throat swells Has patient had a PCN reaction causing immediate rash, facial/tongue/throat swelling, SOB or lightheadedness with hypotension: Yes Has patient had a PCN reaction causing severe rash involving mucus membranes or skin necrosis: No Has patient had a PCN reaction that required hospitalization: Yes Has patient had a PCN reaction occurring within the last 10  years: No If all of the above answers are "NO", then may proceed with Cephalosporin use.   . Prednisone Anaphylaxis  . Diltiazem Swelling  . Gabapentin Swelling   Antimicrobials this admission: Vancomycin 7/12>>7/18 Metronidazole 7/12>> (8/1) Aztreonam 7/12>> (8/1) Linezolid 7/18> (8/1)  Dose adjustments this admission: 7/15 VT = 17, no change  Microbiology results: 7/12 blood x 2 >> NG - final 7/15 wound culture >> Enterococcus casseliflavus  Harrietta Guardian, PharmD PGY1 Pharmacy Resident 02/02/2018    8:48 AM

## 2018-02-02 NOTE — Progress Notes (Addendum)
Nutrition Follow-up  DOCUMENTATION CODES:   Morbid obesity  INTERVENTION:   -D/c 1 packet Juven BID, each packet provides 80 calories, 8 grams of carbohydrate, and 14 grams of amino acids; supplement contains CaHMB, glutamine, and arginine, to promote wound healing -MVI with minerals daily -30 ml Prostat BID, each supplement provides 100 kcals and 15 grams protein -Glucerna Shake po BID, each supplement provides 220 kcal and 10 grams of protein  NUTRITION DIAGNOSIS:   Increased nutrient needs related to wound healing as evidenced by estimated needs.  Ongoing  GOAL:   Patient will meet greater than or equal to 90% of their needs  Progressing  MONITOR:   PO intake, Supplement acceptance, Labs, Weight trends, Skin, I & O's  REASON FOR ASSESSMENT:   LOS    ASSESSMENT:   [name] is a 47 y.o. female with medical history significant for poorly controlled resistant t2dm, morbid obesity, copd with possible superimposed obesity hypoventilation syndrome, chronic o2 (2L during the day, 4 at night), osa on nightly cpcp, htn, dCHF, CAD (per chart normal nuclear stress 2017, cath 2015 without intervention), bipolar disorder, and tobacco abuse, who presents with vulvar pain.   7/15- s/p  I&D of labial/vulvular area (removed skin and necrotic fat) 7/18- hydrotherapy initiated 7/19- s/p debridement of vulvular necrotizing soft tissue infection, pulsatile lavage, and application of a-cell 1/61- s/p I&D of vulva 7/24- hydrotherapy re-started   Pt sleeping soundly at time of visit. No family present to provide additional history.   Case discussed with RN, who reports pt with good appetite (meal completion 70-100%). RN reports pt has been refusing Juven, stating it tastes "too tart". Plan to d/c to [facility] for additional hydrotherapy pending insurance approval.   Labs reviewed: CBGS: 92-233 (inpatient orders for glycemic control are 170-370 regular conentrated human insulin  BID).  Diet Order:   Diet Order           Diet Carb Modified Fluid consistency: Thin; Room service appropriate? Yes  Diet effective now          EDUCATION NEEDS:   Education needs have been addressed  Skin:  Skin Assessment: Skin Integrity Issues: Skin Integrity Issues:: Incisions, Stage I, Other (Comment) Stage I: anus Incisions: closed- lt vagina, perineum Other: lt labia non pressure wound d/t cellulitis  Last BM:  02/01/18  Height:   Ht Readings from Last 1 Encounters:  01/26/18 5\' 4"  (1.626 m)    Weight:   Wt Readings from Last 1 Encounters:  02/02/18 (!) 329 lb 9.4 oz (149.5 kg)    Ideal Body Weight:  54.5 kg  BMI:  Body mass index is 56.57 kg/m.  Estimated Nutritional Needs:   Kcal:  1650-1850  Protein:  120-135 grams  Fluid:  >1.6 L    Sander Speckman A. Jimmye Norman, RD, LDN, CDE Pager: [phone] After hours Pager: [phone]

## 2018-02-02 NOTE — Progress Notes (Signed)
RT asked pt if she wanted her CPAP for the night and pt stated she has always refused. She states she cannot tolerate any kind of mask on her face at all. CPAP removed from room and order changed to PRN per protocol. RT will continue to monitor.

## 2018-02-03 LAB — CBC WITH DIFFERENTIAL/PLATELET
Abs Immature Granulocytes: 0.1 10*3/uL (ref 0.0–0.1)
Basophils Absolute: 0.1 10*3/uL (ref 0.0–0.1)
Basophils Relative: 1 %
Eosinophils Absolute: 0.2 10*3/uL (ref 0.0–0.7)
Eosinophils Relative: 2 %
HCT: 27.6 % — ABNORMAL LOW (ref 36.0–46.0)
Hemoglobin: 8.3 g/dL — ABNORMAL LOW (ref 12.0–15.0)
Immature Granulocytes: 1 %
Lymphocytes Relative: 10 %
Lymphs Abs: 1.1 10*3/uL (ref 0.7–4.0)
MCH: 27.3 pg (ref 26.0–34.0)
MCHC: 30.1 g/dL (ref 30.0–36.0)
MCV: 90.8 fL (ref 78.0–100.0)
Monocytes Absolute: 0.7 10*3/uL (ref 0.1–1.0)
Monocytes Relative: 6 %
Neutro Abs: 8.7 10*3/uL — ABNORMAL HIGH (ref 1.7–7.7)
Neutrophils Relative %: 80 %
Platelets: 268 10*3/uL (ref 150–400)
RBC: 3.04 MIL/uL — ABNORMAL LOW (ref 3.87–5.11)
RDW: 19.1 % — ABNORMAL HIGH (ref 11.5–15.5)
WBC: 10.8 10*3/uL — ABNORMAL HIGH (ref 4.0–10.5)

## 2018-02-03 LAB — GLUCOSE, CAPILLARY
Glucose-Capillary: 223 mg/dL — ABNORMAL HIGH (ref 70–99)
Glucose-Capillary: 186 mg/dL — ABNORMAL HIGH (ref 70–99)
Glucose-Capillary: 240 mg/dL — ABNORMAL HIGH (ref 70–99)
Glucose-Capillary: 172 mg/dL — ABNORMAL HIGH (ref 70–99)

## 2018-02-03 MED ORDER — FUROSEMIDE 10 MG/ML IJ SOLN
40.0000 mg | Freq: Once | INTRAMUSCULAR | Status: AC
  Administered 2018-02-03: 40 mg via INTRAVENOUS
  Filled 2018-02-03: qty 4

## 2018-02-03 MED ORDER — FUROSEMIDE 10 MG/ML IJ SOLN
40.0000 mg | Freq: Every day | INTRAMUSCULAR | Status: DC
Start: 2018-02-03 — End: 2018-02-04
  Administered 2018-02-03: 40 mg via INTRAVENOUS
  Filled 2018-02-03: qty 4

## 2018-02-03 MED ORDER — LINEZOLID 600 MG PO TABS
600.0000 mg | ORAL_TABLET | Freq: Two times a day (BID) | ORAL | Status: DC
  Administered 2018-02-03 – 2018-02-05 (×5): 600 mg via ORAL
  Filled 2018-02-03 (×5): qty 1

## 2018-02-03 NOTE — Progress Notes (Signed)
Physical Therapy Wound Treatment Patient Details  Name: [name] MRN: [id number] Date of Birth: 02/25/1971  Today's Date: 02/03/2018 Time: 0955-1050 Time Calculation (min): 55 min  Subjective  Subjective: Pt feels that the oral medication is better for pain control but overall she is experiencing greater pain with dressing change. Patient and Family Stated Goals: get wound better Prior Treatments: I&D in OR x 3  Pain Score: Pain Score: 5   Wound Assessment  Pressure Injury 01/26/18 Stage I -  Intact skin with non-blanchable redness of a localized area usually over a bony prominence. (Active)  Dressing Type Foam 02/03/2018  8:55 AM  Dressing Clean;Dry;Intact 02/03/2018  8:55 AM  Dressing Change Frequency PRN 02/03/2018  8:55 AM  Site / Wound Assessment Dressing in place / Unable to assess 02/03/2018  8:55 AM  Drainage Amount None 02/03/2018  8:55 AM  Treatment Cleansed 01/26/2018  5:59 PM     Wound / Incision (Open or Dehisced) 01/21/18 Non-pressure wound Labia Left open area due to cellulitis (Active)  Wound Image   01/31/2018 10:36 AM  Dressing Type ABD;Gauze (Comment);Moist to dry 02/03/2018 12:01 PM  Dressing Changed Changed 02/02/2018 10:00 AM  Dressing Status Clean;Dry;Intact 02/03/2018 12:01 PM  Dressing Change Frequency Twice a day 02/03/2018 12:01 PM  Site / Wound Assessment Pink;Red;Yellow 02/03/2018 12:01 PM  % Wound base Red or Granulating 85% 02/03/2018 12:01 PM  % Wound base Yellow/Fibrinous Exudate 15% 02/03/2018 12:01 PM  % Wound base Black/Eschar 0% 02/03/2018 12:01 PM  % Wound base Other/Granulation Tissue (Comment) 0% 02/03/2018 12:01 PM  Peri-wound Assessment Edema;Erythema (blanchable) 02/03/2018 12:01 PM  Wound Length (cm) 18 cm 01/31/2018 10:36 AM  Wound Width (cm) 6.8 cm 01/31/2018 10:36 AM  Wound Depth (cm) 5 cm 01/31/2018 10:36 AM  Wound Volume (cm^3) 612 cm^3 01/31/2018 10:36 AM  Wound Surface Area (cm^2) 122.4 cm^2 01/31/2018 10:36 AM  Tunneling (cm) 7 cm at lower  central part of wound 01/31/2018 10:36 AM  Margins Unattached edges (unapproximated) 02/03/2018 12:01 PM  Closure None 02/03/2018 12:01 PM  Drainage Amount Moderate 02/03/2018 12:01 PM  Drainage Description Serous;Serosanguineous 02/03/2018 12:01 PM  Treatment Debridement (Selective);Hydrotherapy (Pulse lavage);Packing (Saline gauze) 02/02/2018 10:00 AM      Hydrotherapy Pulsed lavage therapy - wound location: L labia/vulva Pulsed Lavage with Suction (psi): 8 psi Pulsed Lavage with Suction - Normal Saline Used: 1000 mL Pulsed Lavage Tip: Tip with splash shield Selective Debridement Selective Debridement - Location: L labia/vulva Selective Debridement - Tools Used: Forceps;Scissors Selective Debridement - Tissue Removed: yellow necrotic material   Wound Assessment and Plan  Wound Therapy - Assess/Plan/Recommendations Wound Therapy - Clinical Statement: Pt continues to have significant pain with hydrotherapy. Pt educated that with more viable tissue there are more nerves exposed and therefore more pain is possible. Despite increased pain pt feels that the oral medication is working better. Pt will continue to benefit from hydrotherapy and selective debridement to promote wound healing.  Wound Therapy - Functional Problem List: Decr mobility and activity Factors Delaying/Impairing Wound Healing: Diabetes Mellitus;Immobility;Multiple medical problems Hydrotherapy Plan: Debridement;Dressing change;Patient/family education;Pulsatile lavage with suction Wound Therapy - Frequency: 6X / week Wound Therapy - Follow Up Recommendations: Skilled nursing facility Wound Plan: See above  Wound Therapy Goals- Improve the function of patient's integumentary system by progressing the wound(s) through the phases of wound healing (inflammation - proliferation - remodeling) by: Decrease Necrotic Tissue to: 5 Decrease Necrotic Tissue - Progress: Progressing toward goal Increase Granulation Tissue to: 95 Increase  Granulation Tissue -   Progress: Progressing toward goal Goals/treatment plan/discharge plan were made with and agreed upon by patient/family: Yes Time For Goal Achievement: 7 days Wound Therapy - Potential for Goals: Good  Goals will be updated until maximal potential achieved or discharge criteria met.  Discharge criteria: when goals achieved, discharge from hospital, MD decision/surgical intervention, no progress towards goals, refusal/missing three consecutive treatments without notification or medical reason.  GP      V  02/03/2018, 12:05 PM 02/03/2018  Ken , PT [phone]  (pager)  

## 2018-02-03 NOTE — Clinical Social Work Note (Signed)
Pt will go to [facility] at d/c. Clinical Social Worker will sign off for now as social work intervention is no longer needed. Please consult [place] again if new need arises.   [name] [name] 02/03/2018

## 2018-02-03 NOTE — Progress Notes (Signed)
PROGRESS NOTE  [name] CBJ:[id number] DOB: 08/29/1970 DOA: 01/19/2018 PCP: Pleas Koch, NP  HPI/Recap of past 24 hours:  C/o pain is better controlled , able to tolerate hydrotherapy better, still has pain with dressing changes,  She is edematous No fever   Assessment/Plan: Principal Problem:   Necrotizing soft tissue infection Active Problems:   Morbid (severe) obesity due to excess calories (HCC)   Tobacco abuse   COPD GOLD 0    Seizure disorder (HCC)   Bipolar disorder (HCC)   Sleep apnea   Essential hypertension   Diabetes (HCC)   IBS (irritable bowel syndrome)   Sepsis (HCC)   Elevated serum hCG   Open wound of genital labia   Vulvar abscess   Poorly controlled type 2 diabetes mellitus ([place])   DNR (do not resuscitate)   CKD (chronic kidney disease), stage III ([place])   Pressure injury of skin   Necrotizing soft tissue infection of Mons/ vulva with sepsis presented on admission S/p Irrigation and debridement labia/vulval area 40 cm of skin necrotic fat debrided sharply with a scalpel 01/22/2018,Dr. Coralie Keens S/p Excision normal debridement of valvular necrotizing soft tissue infection(72 cm)by scalpel,Application ofAcell micro matrix powder x2(1000mg  x2), Application of 10 cm x 15 cm 3 layerCytal Acell sheet,Application of 7 cm x 10 cm 3 layer Cytal Acell sheet,Pulsatile lavage,01/26/2018 Dr. 02/08/2018 S/p Irrigation and debridement ofthe Vulva,01/29/2018, Dr. 02/11/2018  -pcn allergic, Patient is started onIV aztreonam / metronidazole/vancomycin. vanc discontinued due to  VRE in wound culture, she is started on zyvox since 7/18, she is continued on aztreonam and flagyl - my hospitalist colleague Dr 8/18 discussed with ID. D/c vanc and started linezolid on 7/18. Stopped SSRI. I have discussed with ID Dr 8/18 on 7/26 who recommended can continue linezolid, can d/c aztreonam and flagyl. -wbc normalized, lactic acidosis  resolved, general surgery recommended 7 more days of abx from 7/25 due to residual erythema on her mons -start hydrotherapy -ok to d/c to LTAC /SNF and continue wound care and hydrotherapy from general surgery stand point, case manager consulted  Uncontrolled type 2 diabetes mellitus: -a1c 10.4 -metformin presently remains on hold - currently on high dose BID with SSI coverage -U500 has been continued. Glucose trends stable  Acute on chronic kidney injury on CKDII:  -Bun/cr 60/2.18 on presentation -Cr baseline is 1.4- 1.8 . Cr now at baseline -diuretics on hold (metolazone, torsemide and Aldactone) since admission, restarted iv lasix on 7/25 due to some lower extremity  edeam - have stopped NSAIDs -Labs reviewed. Renal function remains stable  elevated HCG: Reportedly has not had sexual intercourse for past 4-5 years follow clinically  -Recommend close outpatient follow up  COPD / OSA: on home 02 for years - sees pulm in office for COPD goldstage 0 w nocturnal hypoxemia (on oct O2 at 3L as of sept 2018 office visit) - Supportive care, p.r.n. Albuterol, on symbicort at home -no wheezing currently -Patient has been refusing CPAP at night -Recent ABG unremarkabe without evidence of hypercarbia -Currently on minimal O2 support Body mass index is 56.72 kg/m. meet morbid obesity criteria.   Chronic diastolic CHF:  - admitted here by cardiology once in 2017 and once late 2018 for dCHF exacerbation - diuretics on hold in view of sepsis -started to have edema, restarted lasix iv on 7/25 - plan to resume home diuretics at discharge  NSVT on tele, asymptomatic She has h/o allergy to betablocker and ccb  DNR: Seen by palliative  care 7/15, see their notes, remains DNR and no re-intubation Patient was seen by palliative care 7/15, patient wants the following: - DNR - No trach - Doesnot wantto be re-intubated.    - No feeding tubes - Not keen on dialysis;not a good long term candidateregardlessgiven co-morbidities and overall weakness  DVT prophylaxis: Heparin subq Code Status: DNR Family Communication: Pt in room, husband at bedside on 7/25 Disposition Plan: [facility] to continue wound care/hydrotherapy  Consultants:   Palliative care  General Surgery  Phone conversation with infectious disease by Dr Wyline Copas  Procedures:  -SP I&D labial/ vulvar area 01/22/18 by Gen Surgery -Surgical debridement 7/19 -Surgical debridement 7/22 hydrotherapy   Antibiotics:  As above   Objective: BP (!) 120/58 (BP Location: Right Arm)   Pulse 82   Temp 97.7 F (36.5 C) (Oral)   Resp 15   Ht 5\' 4"  (1.626 m)   Wt (!) 149.9 kg (330 lb 7.5 oz)   LMP 11/11/2011   SpO2 98%   BMI 56.72 kg/m   Intake/Output Summary (Last 24 hours) at 02/03/2018 0749 Last data filed at 02/03/2018 0525 Gross per 24 hour  Intake 1100 ml  Output 1150 ml  Net -50 ml   Filed Weights   02/01/18 0500 02/02/18 0647 02/03/18 0526  Weight: (!) 149.3 kg (329 lb 2.4 oz) (!) 149.5 kg (329 lb 9.4 oz) (!) 149.9 kg (330 lb 7.5 oz)    Exam: Patient is examined daily including today on 02/03/2018, exams remain the same as of yesterday except that has changed    General:  NAD, awake and interactive, obese  Cardiovascular: RRR  Respiratory: diminished at base  Abdomen: Soft/ND/NT, positive BS, left pubic mons dressing intact  Musculoskeletal: trace bilateral pedal Edema  Neuro: alert, oriented   Data Reviewed: Basic Metabolic Panel: Recent Labs  Lab 01/29/18 0316 01/30/18 0432 01/31/18 0219 02/01/18 0314 02/02/18 0302  NA 139 136 138 138 138  K 5.0 4.8 5.0 5.0 4.8  CL 105 105 106 104 102  CO2 27 24 27 28 29   GLUCOSE 206* 94 237* 150* 123*  BUN 15 14 12 14 12   CREATININE 0.80 0.87 0.81 0.85 0.97  CALCIUM 8.1* 8.0* 8.0* 8.3* 8.4*  MG  --   --   --   --  2.1   Liver  Function Tests: No results for input(s): AST, ALT, ALKPHOS, BILITOT, PROT, ALBUMIN in the last 168 hours. No results for input(s): LIPASE, AMYLASE in the last 168 hours. No results for input(s): AMMONIA in the last 168 hours. CBC: Recent Labs  Lab 01/29/18 0316 01/30/18 0432 01/31/18 0219 02/01/18 0314 02/02/18 0302  WBC 13.4* 12.6* 12.5* 10.0 10.1  HGB 9.4* 8.2* 8.5* 8.4* 8.4*  HCT 31.3* 27.6* 28.8* 28.9* 28.4*  MCV 89.2 90.2 90.6 90.9 91.3  PLT 312 296 279 271 262   Cardiac Enzymes:   No results for input(s): CKTOTAL, CKMB, CKMBINDEX, TROPONINI in the last 168 hours. BNP (last 3 results) Recent Labs    03/14/17 1840 07/20/17 2053 10/19/17 2148  BNP 103.4* 29.6 54.6    ProBNP (last 3 results) No results for input(s): PROBNP in the last 8760 hours.  CBG: Recent Labs  Lab 02/01/18 2133 02/02/18 0742 02/02/18 1216 02/02/18 1624 02/02/18 2158  GLUCAP 230* 92 114* 123* 182*    No results found for this or any previous visit (from the past 240 hour(s)).   Studies: No results found.  Scheduled Meds: . acetaminophen  650 mg  Oral Q6H  . busPIRone  30 mg Oral BID  . famotidine  20 mg Oral Daily  . feeding supplement (GLUCERNA SHAKE)  237 mL Oral BID BM  . feeding supplement (PRO-STAT SUGAR FREE 64)  30 mL Oral BID  . furosemide  40 mg Intravenous Daily  . heparin  5,000 Units Subcutaneous Q8H  . insulin regular human CONCENTRATED  170-370 Units Subcutaneous BID WC  . linezolid  600 mg Oral Q12H  . mometasone-formoterol  2 puff Inhalation BID  . multivitamin with minerals  1 tablet Oral Daily  . pregabalin  300 mg Oral BID    Continuous Infusions:    Time spent: 52mins,  I have personally reviewed and interpreted on  02/03/2018 daily labs,  imagings as discussed above under date review session and assessment and plans.  I reviewed all nursing notes, pharmacy notes, consultant notes,  vitals, pertinent old records  I have discussed plan of care as  described above with RN , patient on 02/03/2018   Florencia Reasons MD, PhD  [facility] Pager [phone]. If 7PM-7AM, please contact night-coverage at [email or web address], password [facility] 02/03/2018, 7:49 AM  LOS: 15 days

## 2018-02-03 NOTE — Progress Notes (Signed)
Pt declined use of CPAP- no machine in room

## 2018-02-04 LAB — COMPREHENSIVE METABOLIC PANEL
ALT: 11 U/L (ref 0–44)
AST: 15 U/L (ref 15–41)
Albumin: 2.1 g/dL — ABNORMAL LOW (ref 3.5–5.0)
Alkaline Phosphatase: 101 U/L (ref 38–126)
Anion gap: 6 (ref 5–15)
BUN: 12 mg/dL (ref 6–20)
CO2: 31 mmol/L (ref 22–32)
Calcium: 8.6 mg/dL — ABNORMAL LOW (ref 8.9–10.3)
Chloride: 99 mmol/L (ref 98–111)
Creatinine, Ser: 0.99 mg/dL (ref 0.44–1.00)
GFR calc Af Amer: >60 mL/min (ref >60)
GFR calc non Af Amer: >60 mL/min (ref >60)
Glucose, Bld: 91 mg/dL (ref 70–99)
Potassium: 4.5 mmol/L (ref 3.5–5.1)
Sodium: 136 mmol/L (ref 135–145)
Total Bilirubin: 0.6 mg/dL (ref 0.3–1.2)
Total Protein: 7.5 g/dL (ref 6.5–8.1)

## 2018-02-04 LAB — GLUCOSE, CAPILLARY
Glucose-Capillary: 74 mg/dL (ref 70–99)
Glucose-Capillary: 122 mg/dL — ABNORMAL HIGH (ref 70–99)
Glucose-Capillary: 187 mg/dL — ABNORMAL HIGH (ref 70–99)
Glucose-Capillary: 216 mg/dL — ABNORMAL HIGH (ref 70–99)

## 2018-02-04 LAB — MAGNESIUM: Magnesium: 2.1 mg/dL (ref 1.7–2.4)

## 2018-02-04 MED ORDER — FUROSEMIDE 10 MG/ML IJ SOLN
40.0000 mg | Freq: Two times a day (BID) | INTRAMUSCULAR | Status: DC
  Administered 2018-02-04 – 2018-02-05 (×3): 40 mg via INTRAVENOUS
  Filled 2018-02-04 (×3): qty 4

## 2018-02-04 NOTE — Progress Notes (Signed)
Pt declined use of CPAP 

## 2018-02-04 NOTE — Progress Notes (Signed)
PROGRESS NOTE  [name] MGN:[id number] DOB: 1971-04-10 DOA: 01/19/2018 PCP: Pleas Koch, NP  HPI/Recap of past 24 hours:   still has pain with dressing changes,  She is edematous No fever Husband at bedside  Assessment/Plan: Principal Problem:   Necrotizing soft tissue infection Active Problems:   Morbid (severe) obesity due to excess calories (HCC)   Tobacco abuse   COPD GOLD 0    Seizure disorder (HCC)   Bipolar disorder (HCC)   Sleep apnea   Essential hypertension   Diabetes (HCC)   IBS (irritable bowel syndrome)   Sepsis (HCC)   Elevated serum hCG   Open wound of genital labia   Vulvar abscess   Poorly controlled type 2 diabetes mellitus ([place])   DNR (do not resuscitate)   CKD (chronic kidney disease), stage III ([place])   Pressure injury of skin   Necrotizing soft tissue infection of Mons/ vulva with sepsis presented on admission S/p Irrigation and debridement labia/vulval area 40 cm of skin necrotic fat debrided sharply with a scalpel 01/22/2018,Dr. Coralie Keens S/p Excision normal debridement of valvular necrotizing soft tissue infection(72 cm)by scalpel,Application ofAcell micro matrix powder x2(1000mg  x2), Application of 10 cm x 15 cm 3 layerCytal Acell sheet,Application of 7 cm x 10 cm 3 layer Cytal Acell sheet,Pulsatile lavage,01/26/2018 Dr. 02/08/2018 S/p Irrigation and debridement ofthe Vulva,01/29/2018, Dr. 02/11/2018  -pcn allergic, Patient is started onIV aztreonam / metronidazole/vancomycin. vanc discontinued due to  VRE in wound culture, she is started on zyvox since 7/18, she is continued on aztreonam and flagyl - my hospitalist colleague Dr 8/18 discussed with ID. D/c vanc and started linezolid on 7/18. Stopped SSRI. I have discussed with ID Dr 8/18 on 7/26 who recommended can continue linezolid, can d/c aztreonam and flagyl. -wbc normalized, lactic acidosis resolved, general surgery recommended 7 more days of  abx from 7/25 due to residual erythema on her mons -on hydrotherapy -ok to d/c to LTAC /SNF and continue wound care and hydrotherapy from general surgery stand point, case manager consulted  Uncontrolled type 2 diabetes mellitus, insulin dependent: -a1c 10.4 -metformin presently remains on hold - currently on high dose BID with SSI coverage -U500 has been continued. Glucose trends stable  Acute on chronic kidney injury on CKDII:  -Bun/cr 60/2.18 on presentation -Cr baseline is 1.4- 1.8 . Cr now at baseline -diuretics on hold (metolazone, torsemide and Aldactone) since admission, restarted iv lasix on 7/25 due to some lower extremity  edema - have stopped NSAIDs -Labs reviewed. Renal function remains stable  Chronic diastolic CHF:  - admitted here by cardiology once in 2017 and once late 2018 for dCHF exacerbation - diuretics on hold in view of sepsis -started to have edema, restarted lasix iv on 7/25, on lasix IV bid, monitor bp, cr - plan to resume home diuretics at discharge  NSVT on tele, asymptomatic She has h/o allergy to betablocker and ccb   COPD / OSA: on home 02 for years - sees pulm in office for COPD goldstage 0 w nocturnal hypoxemia (on oct O2 at 3L as of sept 2018 office visit) - Supportive care, p.r.n. Albuterol, on symbicort at home -no wheezing currently -Patient has been refusing CPAP at night -Recent ABG unremarkabe without evidence of hypercarbia -Currently on minimal O2 support Body mass index is 56.31 kg/m. meet morbid obesity criteria.  elevated HCG: Reportedly has not had sexual intercourse for past 4-5 years follow clinically  -Recommend close outpatient follow up    DNR:  Seen by palliative care 7/15, see their notes, remains DNR and no re-intubation Patient was seen by palliative care 7/15, patient wants the following: - DNR - No trach - Doesnot wantto be re-intubated.    - No feeding tubes - Not keen on dialysis;not a good long term candidateregardlessgiven co-morbidities and overall weakness  DVT prophylaxis: Heparin subq Code Status: DNR Family Communication: Pt in room, husband at bedside on 7/25 and 7/28 Disposition Plan: [facility] to continue wound care/hydrotherapy  Consultants:   Palliative care  General Surgery  Phone conversation with infectious disease by Dr Wyline Copas  Procedures:  -SP I&D labial/ vulvar area 01/22/18 by Gen Surgery -Surgical debridement 7/19 -Surgical debridement 7/22 hydrotherapy   Antibiotics:  As above   Objective: BP 125/60 (BP Location: Left Arm)   Pulse 82   Temp 97.9 F (36.6 C) (Oral)   Resp (!) 21   Ht 5\' 4"  (1.626 m)   Wt (!) 148.8 kg (328 lb 0.7 oz)   LMP 11/11/2011   SpO2 99%   BMI 56.31 kg/m   Intake/Output Summary (Last 24 hours) at 02/04/2018 0728 Last data filed at 02/04/2018 0600 Gross per 24 hour  Intake 480 ml  Output 2200 ml  Net -1720 ml   Filed Weights   02/02/18 0647 02/03/18 0526 02/04/18 0559  Weight: (!) 149.5 kg (329 lb 9.4 oz) (!) 149.9 kg (330 lb 7.5 oz) (!) 148.8 kg (328 lb 0.7 oz)    Exam: Patient is examined daily including today on 02/04/2018, exams remain the same as of yesterday except that has changed    General:  NAD, awake and interactive, obese  Cardiovascular: RRR  Respiratory: diminished at base  Abdomen: Soft/ND/NT, positive BS, left pubic mons dressing intact  Musculoskeletal: trace bilateral pedal Edema  Neuro: alert, oriented   Data Reviewed: Basic Metabolic Panel: Recent Labs  Lab 01/30/18 0432 01/31/18 0219 02/01/18 0314 02/02/18 0302 02/04/18 0323  NA 136 138 138 138 136  K 4.8 5.0 5.0 4.8 4.5  CL 105 106 104 102 99  CO2 24 27 28 29 31   GLUCOSE 94 237* 150* 123* 91  BUN 14 12 14 12 12   CREATININE 0.87 0.81 0.85 0.97 0.99  CALCIUM 8.0* 8.0* 8.3* 8.4* 8.6*  MG  --   --   --  2.1 2.1   Liver  Function Tests: Recent Labs  Lab 02/04/18 0323  AST 15  ALT 11  ALKPHOS 101  BILITOT 0.6  PROT 7.5  ALBUMIN 2.1*   No results for input(s): LIPASE, AMYLASE in the last 168 hours. No results for input(s): AMMONIA in the last 168 hours. CBC: Recent Labs  Lab 01/30/18 0432 01/31/18 0219 02/01/18 0314 02/02/18 0302 02/03/18 0838  WBC 12.6* 12.5* 10.0 10.1 10.8*  NEUTROABS  --   --   --   --  8.7*  HGB 8.2* 8.5* 8.4* 8.4* 8.3*  HCT 27.6* 28.8* 28.9* 28.4* 27.6*  MCV 90.2 90.6 90.9 91.3 90.8  PLT 296 279 271 262 268   Cardiac Enzymes:   No results for input(s): CKTOTAL, CKMB, CKMBINDEX, TROPONINI in the last 168 hours. BNP (last 3 results) Recent Labs    03/14/17 1840 07/20/17 2053 10/19/17 2148  BNP 103.4* 29.6 54.6    ProBNP (last 3 results) No results for input(s): PROBNP in the last 8760 hours.  CBG: Recent Labs  Lab 02/02/18 2158 02/03/18 0832 02/03/18 1156 02/03/18 1651 02/03/18 2108  GLUCAP 182* 223* 186* 240* 172*  No results found for this or any previous visit (from the past 240 hour(s)).   Studies: No results found.  Scheduled Meds: . acetaminophen  650 mg Oral Q6H  . busPIRone  30 mg Oral BID  . famotidine  20 mg Oral Daily  . feeding supplement (GLUCERNA SHAKE)  237 mL Oral BID BM  . feeding supplement (PRO-STAT SUGAR FREE 64)  30 mL Oral BID  . furosemide  40 mg Intravenous BID  . heparin  5,000 Units Subcutaneous Q8H  . insulin regular human CONCENTRATED  170-370 Units Subcutaneous BID WC  . linezolid  600 mg Oral Q12H  . mometasone-formoterol  2 puff Inhalation BID  . multivitamin with minerals  1 tablet Oral Daily  . pregabalin  300 mg Oral BID    Continuous Infusions:    Time spent: 55mins,  I have personally reviewed and interpreted on  02/04/2018 daily labs,  imagings as discussed above under date review session and assessment and plans.  I reviewed all nursing notes, pharmacy notes, consultant notes,  vitals, pertinent  old records  I have discussed plan of care as described above with RN , patient on 02/04/2018   Florencia Reasons MD, PhD  [facility] Pager [phone]. If 7PM-7AM, please contact night-coverage at [email or web address], password [facility] 02/04/2018, 7:28 AM  LOS: 16 days

## 2018-02-05 DIAGNOSIS — I69354 Hemiplegia and hemiparesis following cerebral infarction affecting left non-dominant side: Secondary | ICD-10-CM | POA: Diagnosis not present

## 2018-02-05 DIAGNOSIS — J209 Acute bronchitis, unspecified: Secondary | ICD-10-CM | POA: Diagnosis not present

## 2018-02-05 DIAGNOSIS — J45909 Unspecified asthma, uncomplicated: Secondary | ICD-10-CM | POA: Diagnosis not present

## 2018-02-05 DIAGNOSIS — T8189XA Other complications of procedures, not elsewhere classified, initial encounter: Secondary | ICD-10-CM | POA: Diagnosis not present

## 2018-02-05 DIAGNOSIS — G8929 Other chronic pain: Secondary | ICD-10-CM | POA: Diagnosis not present

## 2018-02-05 DIAGNOSIS — M793 Panniculitis, unspecified: Secondary | ICD-10-CM | POA: Diagnosis not present

## 2018-02-05 DIAGNOSIS — B952 Enterococcus as the cause of diseases classified elsewhere: Secondary | ICD-10-CM | POA: Diagnosis present

## 2018-02-05 DIAGNOSIS — G9341 Metabolic encephalopathy: Secondary | ICD-10-CM | POA: Diagnosis not present

## 2018-02-05 DIAGNOSIS — E1165 Type 2 diabetes mellitus with hyperglycemia: Secondary | ICD-10-CM | POA: Diagnosis not present

## 2018-02-05 DIAGNOSIS — S31502A Unspecified open wound of unspecified external genital organs, female, initial encounter: Secondary | ICD-10-CM | POA: Diagnosis not present

## 2018-02-05 DIAGNOSIS — G8922 Chronic post-thoracotomy pain: Secondary | ICD-10-CM | POA: Diagnosis not present

## 2018-02-05 DIAGNOSIS — G4089 Other seizures: Secondary | ICD-10-CM | POA: Diagnosis not present

## 2018-02-05 DIAGNOSIS — E1142 Type 2 diabetes mellitus with diabetic polyneuropathy: Secondary | ICD-10-CM | POA: Diagnosis not present

## 2018-02-05 DIAGNOSIS — R262 Difficulty in walking, not elsewhere classified: Secondary | ICD-10-CM | POA: Diagnosis not present

## 2018-02-05 DIAGNOSIS — R5381 Other malaise: Secondary | ICD-10-CM | POA: Diagnosis present

## 2018-02-05 DIAGNOSIS — F319 Bipolar disorder, unspecified: Secondary | ICD-10-CM | POA: Diagnosis not present

## 2018-02-05 DIAGNOSIS — F419 Anxiety disorder, unspecified: Secondary | ICD-10-CM | POA: Diagnosis not present

## 2018-02-05 DIAGNOSIS — E1122 Type 2 diabetes mellitus with diabetic chronic kidney disease: Secondary | ICD-10-CM | POA: Diagnosis not present

## 2018-02-05 DIAGNOSIS — J9610 Chronic respiratory failure, unspecified whether with hypoxia or hypercapnia: Secondary | ICD-10-CM | POA: Diagnosis not present

## 2018-02-05 DIAGNOSIS — M726 Necrotizing fasciitis: Secondary | ICD-10-CM | POA: Diagnosis not present

## 2018-02-05 DIAGNOSIS — G8912 Acute post-thoracotomy pain: Secondary | ICD-10-CM | POA: Diagnosis not present

## 2018-02-05 DIAGNOSIS — N182 Chronic kidney disease, stage 2 (mild): Secondary | ICD-10-CM | POA: Diagnosis present

## 2018-02-05 DIAGNOSIS — L03314 Cellulitis of groin: Secondary | ICD-10-CM | POA: Diagnosis not present

## 2018-02-05 DIAGNOSIS — E785 Hyperlipidemia, unspecified: Secondary | ICD-10-CM | POA: Diagnosis present

## 2018-02-05 DIAGNOSIS — R102 Pelvic and perineal pain: Secondary | ICD-10-CM | POA: Diagnosis not present

## 2018-02-05 DIAGNOSIS — Z6841 Body Mass Index (BMI) 40.0 and over, adult: Secondary | ICD-10-CM | POA: Diagnosis not present

## 2018-02-05 DIAGNOSIS — G4733 Obstructive sleep apnea (adult) (pediatric): Secondary | ICD-10-CM | POA: Diagnosis not present

## 2018-02-05 DIAGNOSIS — I5032 Chronic diastolic (congestive) heart failure: Secondary | ICD-10-CM | POA: Diagnosis not present

## 2018-02-05 DIAGNOSIS — E11628 Type 2 diabetes mellitus with other skin complications: Secondary | ICD-10-CM | POA: Diagnosis not present

## 2018-02-05 DIAGNOSIS — Z66 Do not resuscitate: Secondary | ICD-10-CM | POA: Diagnosis not present

## 2018-02-05 DIAGNOSIS — A4181 Sepsis due to Enterococcus: Secondary | ICD-10-CM | POA: Diagnosis not present

## 2018-02-05 DIAGNOSIS — Z515 Encounter for palliative care: Secondary | ICD-10-CM | POA: Diagnosis not present

## 2018-02-05 DIAGNOSIS — J449 Chronic obstructive pulmonary disease, unspecified: Secondary | ICD-10-CM | POA: Diagnosis not present

## 2018-02-05 DIAGNOSIS — I1310 Hypertensive heart and chronic kidney disease without heart failure, with stage 1 through stage 4 chronic kidney disease, or unspecified chronic kidney disease: Secondary | ICD-10-CM | POA: Diagnosis not present

## 2018-02-05 DIAGNOSIS — N764 Abscess of vulva: Secondary | ICD-10-CM | POA: Diagnosis present

## 2018-02-05 DIAGNOSIS — Z9981 Dependence on supplemental oxygen: Secondary | ICD-10-CM | POA: Diagnosis not present

## 2018-02-05 DIAGNOSIS — I452 Bifascicular block: Secondary | ICD-10-CM | POA: Diagnosis not present

## 2018-02-05 DIAGNOSIS — I509 Heart failure, unspecified: Secondary | ICD-10-CM | POA: Diagnosis not present

## 2018-02-05 DIAGNOSIS — M6281 Muscle weakness (generalized): Secondary | ICD-10-CM | POA: Diagnosis not present

## 2018-02-05 DIAGNOSIS — B372 Candidiasis of skin and nail: Secondary | ICD-10-CM | POA: Diagnosis not present

## 2018-02-05 DIAGNOSIS — I472 Ventricular tachycardia: Secondary | ICD-10-CM | POA: Diagnosis not present

## 2018-02-05 DIAGNOSIS — N7681 Mucositis (ulcerative) of vagina and vulva: Secondary | ICD-10-CM | POA: Diagnosis not present

## 2018-02-05 DIAGNOSIS — E662 Morbid (severe) obesity with alveolar hypoventilation: Secondary | ICD-10-CM | POA: Diagnosis not present

## 2018-02-05 DIAGNOSIS — E119 Type 2 diabetes mellitus without complications: Secondary | ICD-10-CM | POA: Diagnosis not present

## 2018-02-05 DIAGNOSIS — I130 Hypertensive heart and chronic kidney disease with heart failure and stage 1 through stage 4 chronic kidney disease, or unspecified chronic kidney disease: Secondary | ICD-10-CM | POA: Diagnosis not present

## 2018-02-05 DIAGNOSIS — N189 Chronic kidney disease, unspecified: Secondary | ICD-10-CM | POA: Diagnosis not present

## 2018-02-05 DIAGNOSIS — K589 Irritable bowel syndrome without diarrhea: Secondary | ICD-10-CM | POA: Diagnosis present

## 2018-02-05 DIAGNOSIS — S71102A Unspecified open wound, left thigh, initial encounter: Secondary | ICD-10-CM | POA: Diagnosis not present

## 2018-02-05 DIAGNOSIS — R6520 Severe sepsis without septic shock: Secondary | ICD-10-CM | POA: Diagnosis not present

## 2018-02-05 DIAGNOSIS — G894 Chronic pain syndrome: Secondary | ICD-10-CM | POA: Diagnosis present

## 2018-02-05 DIAGNOSIS — J440 Chronic obstructive pulmonary disease with acute lower respiratory infection: Secondary | ICD-10-CM | POA: Diagnosis not present

## 2018-02-05 DIAGNOSIS — F1120 Opioid dependence, uncomplicated: Secondary | ICD-10-CM | POA: Diagnosis not present

## 2018-02-05 DIAGNOSIS — N179 Acute kidney failure, unspecified: Secondary | ICD-10-CM | POA: Diagnosis not present

## 2018-02-05 DIAGNOSIS — F3131 Bipolar disorder, current episode depressed, mild: Secondary | ICD-10-CM | POA: Diagnosis not present

## 2018-02-05 DIAGNOSIS — M7989 Other specified soft tissue disorders: Secondary | ICD-10-CM | POA: Diagnosis not present

## 2018-02-05 DIAGNOSIS — E873 Alkalosis: Secondary | ICD-10-CM | POA: Diagnosis not present

## 2018-02-05 DIAGNOSIS — I2510 Atherosclerotic heart disease of native coronary artery without angina pectoris: Secondary | ICD-10-CM | POA: Diagnosis present

## 2018-02-05 DIAGNOSIS — D649 Anemia, unspecified: Secondary | ICD-10-CM | POA: Diagnosis present

## 2018-02-05 DIAGNOSIS — G40909 Epilepsy, unspecified, not intractable, without status epilepticus: Secondary | ICD-10-CM | POA: Diagnosis not present

## 2018-02-05 DIAGNOSIS — B965 Pseudomonas (aeruginosa) (mallei) (pseudomallei) as the cause of diseases classified elsewhere: Secondary | ICD-10-CM | POA: Diagnosis not present

## 2018-02-05 DIAGNOSIS — K219 Gastro-esophageal reflux disease without esophagitis: Secondary | ICD-10-CM | POA: Diagnosis present

## 2018-02-05 LAB — BASIC METABOLIC PANEL
Anion gap: 12 (ref 5–15)
BUN: 14 mg/dL (ref 6–20)
CO2: 28 mmol/L (ref 22–32)
Calcium: 8.6 mg/dL — ABNORMAL LOW (ref 8.9–10.3)
Chloride: 95 mmol/L — ABNORMAL LOW (ref 98–111)
Creatinine, Ser: 1.03 mg/dL — ABNORMAL HIGH (ref 0.44–1.00)
GFR calc Af Amer: >60 mL/min (ref >60)
GFR calc non Af Amer: >60 mL/min (ref >60)
Glucose, Bld: 249 mg/dL — ABNORMAL HIGH (ref 70–99)
Potassium: 4.3 mmol/L (ref 3.5–5.1)
Sodium: 135 mmol/L (ref 135–145)

## 2018-02-05 LAB — GLUCOSE, CAPILLARY
Glucose-Capillary: 267 mg/dL — ABNORMAL HIGH (ref 70–99)
Glucose-Capillary: 200 mg/dL — ABNORMAL HIGH (ref 70–99)

## 2018-02-05 MED ORDER — CLONAZEPAM 0.5 MG PO TABS: 0.5000 mg | ORAL_TABLET | Freq: Three times a day (TID) | ORAL | 0 refills | Status: DC | PRN

## 2018-02-05 MED ORDER — DAKINS (1/4 STRENGTH) 0.125 % EX SOLN
Freq: Two times a day (BID) | CUTANEOUS | Status: DC
  Filled 2018-02-05: qty 473

## 2018-02-05 MED ORDER — LINEZOLID 600 MG PO TABS: 600.0000 mg | ORAL_TABLET | Freq: Two times a day (BID) | ORAL | 0 refills | Status: AC

## 2018-02-05 MED ORDER — DAKINS (1/4 STRENGTH) 0.125 % EX SOLN: Freq: Two times a day (BID) | CUTANEOUS | 0 refills | Status: DC

## 2018-02-05 MED ORDER — OXYCODONE HCL 5 MG PO TABS: 5.0000 mg | ORAL_TABLET | ORAL | 0 refills | Status: DC | PRN

## 2018-02-05 NOTE — Progress Notes (Signed)
Patient discharged per [facility] to [facility] on O2 @ 3L/[place].  All personal belongings sent w/family.

## 2018-02-05 NOTE — Progress Notes (Signed)
PT Cancellation Note  Patient Details Name: [name] MRN: [id number] DOB: 29-Oct-1970   Cancelled Treatment:    Reason Eval/Treat Not Completed: Pt refusing hydrotherapy this date. Acknowledges benefits of doing treatment today however states she wants to wait until she gets to [facility] to resume hydrotherapy. Will continue to follow until d/c.    Thelma Comp 02/05/2018, 10:59 AM   Rolinda Roan, PT, DPT Acute Rehabilitation Services Pager: [phone]

## 2018-02-05 NOTE — Progress Notes (Signed)
[facility] Progress Note  7 Days Post-Op  Subjective: CC:  Pain and anxiety currently controlled. Patient is denying hydrotherapy this AM. Reports moving 4 feet with PT. Husband at bedside.   Objective: Vital signs in last 24 hours: Temp:  [98.4 F (36.9 C)-99.4 F (37.4 C)] 98.4 F (36.9 C) (07/29 0700) Pulse Rate:  [91-94] 94 (07/29 0700) Resp:  [18-20] 18 (07/29 0700) BP: (133-135)/(69) 135/69 (07/29 0700) SpO2:  [94 %-100 %] 95 % (07/29 0853) Weight:  [148.7 kg (327 lb 13.2 oz)] 148.7 kg (327 lb 13.2 oz) (07/29 0532) Last BM Date: 02/04/18  Intake/Output from previous day: 07/28 0701 - 07/29 0700 In: 720 [P.O.:720] Out: 1100 [Urine:1100] Intake/Output this shift: Total I/O In: 480 [P.O.:480] Out: 650 [Urine:650]  PE: Gen:  Alert, NAD, pleasant and cooperative  Skin: warm and dry,  see media for updated photo of vulvar wound. Mons still edematous but without significant erythema. Packing removed - purulent, light blue material on kerlix along with serosanguinous drainage. Wound without significant necrosis. Inferior aspect with improved granulation compared to previous exam. No tunneling. Psych: A&Ox3    Lab Results:  Recent Labs    02/03/18 0838  WBC 10.8*  HGB 8.3*  HCT 27.6*  PLT 268   BMET Recent Labs    02/04/18 0323 02/05/18 0227  NA 136 135  K 4.5 4.3  CL 99 95*  CO2 31 28  GLUCOSE 91 249*  BUN 12 14  CREATININE 0.99 1.03*  CALCIUM 8.6* 8.6*   PT/INR No results for input(s): LABPROT, INR in the last 72 hours. CMP     Component Value Date/Time   NA 135 02/05/2018 0227   NA 137 01/08/2018 1117   K 4.3 02/05/2018 0227   CL 95 (L) 02/05/2018 0227   CO2 28 02/05/2018 0227   GLUCOSE 249 (H) 02/05/2018 0227   BUN 14 02/05/2018 0227   BUN 31 (H) 01/08/2018 1117   CREATININE 1.03 (H) 02/05/2018 0227   CREATININE 1.20 (H) 05/24/2016 0938   CALCIUM 8.6 (L) 02/05/2018 0227   PROT 7.5 02/04/2018 0323   PROT 7.8 01/08/2018 1117   ALBUMIN 2.1 (L) 02/04/2018 0323   ALBUMIN 4.1 01/08/2018 1117   AST 15 02/04/2018 0323   ALT 11 02/04/2018 0323   ALKPHOS 101 02/04/2018 0323   BILITOT 0.6 02/04/2018 0323   BILITOT 0.5 01/08/2018 1117   GFRNONAA >60 02/05/2018 0227   GFRAA >60 02/05/2018 0227   Lipase     Component Value Date/Time   LIPASE 82 (H) 07/19/2017 1942       Studies/Results: No results found.  Anti-infectives: Anti-infectives (From admission, onward)   Start     Dose/Rate Route Frequency Ordered Stop   02/05/18 0000  linezolid (ZYVOX) 600 MG tablet     600 mg Oral Every 12 hours 02/05/18 0848 02/08/18 2359   02/03/18 1000  linezolid (ZYVOX) tablet 600 mg     600 mg Oral Every 12 hours 02/03/18 0748 02/08/18 2359   01/25/18 1530  linezolid (ZYVOX) IVPB 600 mg  Status:  Discontinued    Note to Pharmacy:  Have stopped fluoxetine   600 mg 300 mL/hr over 60 Minutes Intravenous Every 12 hours 01/25/18 1437 02/03/18 0748   01/19/18 1800  vancomycin (VANCOCIN) 1,500 mg in sodium chloride 0.9 % 500 mL IVPB  Status:  Discontinued     1,500 mg 250 mL/hr over 120 Minutes Intravenous Every 24 hours 01/19/18 1639 01/25/18 1437   01/19/18 1700  aztreonam (AZACTAM) 1 g in sodium chloride 0.9 % 100 mL IVPB  Status:  Discontinued     1 g 200 mL/hr over 30 Minutes Intravenous Every 8 hours 01/19/18 1639 02/02/18 1031   01/19/18 1700  metroNIDAZOLE (FLAGYL) IVPB 500 mg  Status:  Discontinued     500 mg 100 mL/hr over 60 Minutes Intravenous Every 8 hours 01/19/18 1639 02/02/18 1031     Assessment/Plan Uncontrolled type 2 diabetes mellitus Acute on chronic kidney injury Elevated hCG COPD/obstructive sleep apnea Chronic diastolic congestive heart failure DNR Body mass index is 55.44   Necrotizing soft tissue infection in the mons and vulva 1.Irrigation and debridement labia/vulval area 40 cm of skin necrotic fat debrided sharply with a scalpel 01/22/2018,Dr. Coralie Keens 2.Excision normal  debridement of valvular necrotizing soft tissue infection(72 cm)by scalpel,Application ofAcell micro matrix powder x2(1000mg  x2), Application of 10 cm x 15 cm 3 layerCytal Acell sheet,Application of 7 cm x 10 cm 3 layer Cytal Acell sheet,Pulsatile lavage,01/26/2018 Dr. 02/08/2018 3.Irrigation and debridement ofthe Vulva,01/29/2018, Dr. 02/11/2018 [place]  FEN: IV fluids/advancing diet Marland Kitchen 7/12-7/26 Vancomycin 7/12 - 01/25/18 DVT: SCD/heparin Follow-up: To be determined  Plan: stable for discharge to LTAC, no acute surgical needs.  start 1/4 strength dakins BID wet to dry dressing changes for 3 days given blue purulent drainage, then transition back to normal saline wet-to-dry BID.    LOS: 17 days    02/07/18 , [facility] 02/05/2018, 11:11 AM Pager: [phone] Consults: [phone] Mon-Fri 7:00 am-4:30 pm Sat-Sun 7:00 am-11:30 am

## 2018-02-05 NOTE — Discharge Summary (Addendum)
Discharge Summary  [name] VZC:[id number] DOB: [birth year]  PCP: Pleas Koch, NP  Admit date: 01/19/2018 Discharge date: 02/05/2018  Time spent: 53mins, more than 50% time spent on coordination of care Patient is discharged to [facility]  Recommendations for Outpatient Follow-up:  1. F/u with PMD within a week  for hospital discharge follow up, repeat cbc/bmp at follow up 2. F/u general surgery for wound  3. F/u with cardiology 4. F/u with gyn for elevated hcg  Discharge Diagnoses:  Active Hospital Problems   Diagnosis Date Noted  . Necrotizing soft tissue infection 01/23/2018  . Pressure injury of skin 01/29/2018  . Vulvar abscess 01/23/2018  . Poorly controlled type 2 diabetes mellitus ([place]) 01/23/2018  . DNR (do not resuscitate) 01/23/2018  . CKD (chronic kidney disease), stage III ([place]) 01/23/2018  . Open wound of genital labia   . IBS (irritable bowel syndrome) 01/19/2018  . Sepsis ([place]) 01/19/2018  . Elevated serum hCG 01/19/2018  . Diabetes ([place])   . Essential hypertension 01/02/2015  . Sleep apnea 05/22/2012  . Seizure disorder ([place]) 10/09/2011  . Bipolar disorder ([place]) 10/09/2011  . COPD GOLD 0  05/30/2007  . Morbid (severe) obesity due to excess calories ([place]) 04/23/2007  . Tobacco abuse 09/07/2006    Resolved Hospital Problems   Diagnosis Date Noted Date Resolved  . Abdominal wall cellulitis 01/19/2018 01/23/2018  . DKA (diabetic ketoacidoses) ([place]) 01/19/2018 01/19/2018    Discharge Condition: stable  Diet recommendation: heart healthy/carb modified  Filed Weights   02/03/18 0526 02/04/18 0559 02/05/18 0532  Weight: (!) 149.9 kg (330 lb 7.5 oz) (!) 148.8 kg (328 lb 0.7 oz) (!) 148.7 kg (327 lb 13.2 oz)    History of present illness: (per admitting MD Dr Si Raider) PCP: Pleas Koch, NP  Patient coming from: home   Chief Complaint: vulvar pain  HPI: [name] is a 47 y.o. female with medical history significant for poorly controlled  resistant t2dm, morbid obesity, copd with possible superimposed obesity hypoventilation syndrome, chronic o2 (2L during the day, 4 at night), osa on nightly cpcp, htn, dCHF, CAD (per chart normal nuclear stress 2017, cath 2015 without intervention), bipolar disorder, and tobacco abuse, who presents with the above.  Over the course of the past 4 days has developed pain and redness and swelling over left groin. Has had nausea with one episode vomiting. Denies fevers/chills. No back pain. No change in baseline dyspnea; no cough. No dysuria. No increased urinary frequency. Nothing draining from the wound. Says is compliant with bid concentrated insulin. Says history rash and breathing problems with penicillin.  Denies hx gestational trophoblastic disease.  Presented to outside urgent care this afternoon with the above complaints and was transferred to our ED for further evaluation  ED Course: 2 L NS, aztreonam/flagyl/vancomycin, labs, CT abd/pelvis    Hospital Course:  Principal Problem:   Necrotizing soft tissue infection Active Problems:   Morbid (severe) obesity due to excess calories ([facility])   Tobacco abuse   COPD GOLD 0    Seizure disorder (H[facility])   Bipolar disorder (HCC)   Sleep apnea   Essential hypertension   Diabetes (H[facility])   IBS (irritable bowel syndrome)   Sepsis (H[facility])   Elevated serum hCG   Open wound of genital labia   Vulvar abscess   Poorly controlled type 2 diabetes mellitus ([place])   DNR (do not resuscitate)   CKD (chronic kidney disease), stage III ([place])   Pressure injury of skin   Necrotizing soft  tissue infection of Mons/ vulva with sepsis presented on admission S/p Irrigation and debridement labia/vulval area 40 cm of skin necrotic fat debrided sharply with a scalpel 01/22/2018,Dr. Coralie Keens S/p Excision normal debridement of valvular necrotizing soft tissue infection(72 cm)by scalpel,Application ofAcell micro matrix powder x2(1000mg   x2), Application of 10 cm x 15 cm 3 layerCytal Acell sheet,Application of 7 cm x 10 cm 3 layer Cytal Acell sheet,Pulsatile lavage,01/26/2018 Dr. 02/08/2018 S/p Irrigation and debridement ofthe Vulva,01/29/2018, Dr. 02/11/2018  -pcn allergic, Patient is started onIV aztreonam / metronidazole/vancomycin. vanc discontinued due to  VRE in wound culture, she is started on zyvox since 7/18, she is continued on aztreonam and flagyl - my hospitalist colleague Dr 8/18 discussed with ID. D/c vanc and started linezolid on 7/18. Stopped SSRI. I have discussed with ID Dr 8/18 on 7/26 who recommended can continue linezolid, can d/c aztreonam and flagyl. -wbc normalized, lactic acidosis resolved, general surgery recommended 7 more days of abx from 7/25 due to residual erythema on her mons -on hydrotherapy -ok to d/c to LTAC /SNF and continue wound care and hydrotherapy from general surgery stand point  start 1/4 strength dakins BID wet to dry dressing changes for 3 days given blue purulent drainage, then transition back to normal saline wet-to-dry BID.  Follow up with wound care and general surgery.    Uncontrolled type 2 diabetes mellitus, insulin dependent: -a1c 10.4 -metformin and ozempic held in the hospital, resumed at discharge - -U500 has beencontinued. Glucose trends stable  Acute on chronic kidney injury on CKDII:  -Bun/cr 60/2.18 on presentation -Cr baseline is 1.4- 1.8 . Cr now at baseline -diuretics on hold (metolazone, torsemide and Aldactone) since admission, restarted iv lasix on 7/25 due to some lower extremity  edema - have stopped NSAIDs -Labs reviewed. Renal function remains stable  Chronic diastolic CHF:  - admitted here by cardiology once in 2017 and once late 2018 for dCHF exacerbation - diuretics on hold in view of sepsis -started to have edema, restarted lasix iv on 7/25, on lasix IV bid, monitor bp, cr -  home diuretics resumed at  discharge -follow up with cardiology  NSVT on tele, asymptomatic She has h/o allergy to betablocker and ccb   COPD / OSA: on home 02 for years - sees pulm in office for COPD goldstage 0 w nocturnal hypoxemia (on oct O2 at 3L as of sept 2018 office visit) - Supportive care, p.r.n. Albuterol, on symbicort at home -no wheezing currently -Patient has been refusing CPAP at night -Recent ABG unremarkabe without evidence of hypercarbia -Currently on minimal O2 support Body mass index is 56.31 kg/m. meet morbid obesity criteria.  elevated HCG: Reportedly has not had sexual intercourse for past 4-5 years follow clinically  -Recommend close outpatient follow up    DNR: Seen by palliative care 7/15, see their notes, remains DNR and no re-intubation Patient was seen by palliative care 7/15, patient wants the following: - DNR - No trach - Doesnot wantto be re-intubated.  - No feeding tubes - Not keen on dialysis;not a good long term candidateregardlessgiven co-morbidities and overall weakness  DVT prophylaxis while in the hospital:Heparin subq Code Status:DNR Family Communication:Pt in room, husband at bedside  Disposition Plan:[facility] at [facility] to continue wound care/hydrotherapy  Consultants:  Palliative care  General Surgery  Phone conversation with infectious disease by Dr 8/15 and Dr Wyline Copas  Procedures: -SP I&D labial/ vulvar area 01/22/18 by Gen Surgery -Surgical debridement 7/19 -Surgical debridement 7/22 hydrotherapy  Antibiotics:  As above   Discharge Exam: BP 135/69 (BP Location: Right Arm)   Pulse 94   Temp 98.4 F (36.9 C) (Oral)   Resp 18   Ht 5\' 4"  (1.626 m)   Wt (!) 148.7 kg (327 lb 13.2 oz)   LMP 11/11/2011   SpO2 95%   BMI 56.27 kg/m    General:  NAD, awake and interactive, obese  Cardiovascular: RRR  Respiratory: diminished at  base  Abdomen: Soft/ND/NT, positive BS, left pubic mons dressing intact  Musculoskeletal: trace bilateral pedal Edema  Neuro: alert, oriented    Discharge Instructions You were cared for by a hospitalist during your hospital stay. If you have any questions about your discharge medications or the care you received while you were in the hospital after you are discharged, you can call the unit and asked to speak with the hospitalist on call if the hospitalist that took care of you is not available. Once you are discharged, your primary care physician will handle any further medical issues. Please note that NO REFILLS for any discharge medications will be authorized once you are discharged, as it is imperative that you return to your primary care physician (or establish a relationship with a primary care physician if you do not have one) for your aftercare needs so that they can reassess your need for medications and monitor your lab values.  Discharge Instructions    Diet - low sodium heart healthy   Complete by:  As directed    carb modified   Increase activity slowly   Complete by:  As directed      Allergies as of 02/05/2018      Reactions   Adhesive [tape] Rash, Other (See Comments)   TAKES OFF THE SKIN (CERTAIN MEDICAL TAPES DO THIS!!)   Metoprolol Shortness Of Breath   Occurrence of shortness of breath after 3 days   Montelukast Shortness Of Breath   Morphine Sulfate Anaphylaxis, Shortness Of Breath, Nausea And Vomiting   Swollen Throat - Able to tolerate dilaudid   Penicillins Anaphylaxis, Hives, Shortness Of Breath   Throat swells Has patient had a PCN reaction causing immediate rash, facial/tongue/throat swelling, SOB or lightheadedness with hypotension: Yes Has patient had a PCN reaction causing severe rash involving mucus membranes or skin necrosis: No Has patient had a PCN reaction that required hospitalization: Yes Has patient had a PCN reaction occurring within the last 10  years: No If all of the above answers are "NO", then may proceed with Cephalosporin use.   Prednisone Anaphylaxis   Diltiazem Swelling   Gabapentin Swelling      Medication List    STOP taking these medications   ALPRAZolam 1 MG tablet Commonly known as:  XANAX   busPIRone 30 MG tablet Commonly known as:  BUSPAR   FLUoxetine 40 MG capsule Commonly known as:  PROZAC   nicotine 21 mg/24hr patch Commonly known as:  NICODERM CQ - dosed in mg/24 hours   ondansetron 4 MG tablet Commonly known as:  ZOFRAN   traZODone 50 MG tablet Commonly known as:  DESYREL     TAKE these medications   acetaminophen 325 MG tablet Commonly known as:  TYLENOL Take 2 tablets (650 mg total) by mouth every 4 (four) hours as needed for headache or mild pain.   albuterol 108 (90 Base) MCG/ACT inhaler Commonly known as:  PROVENTIL HFA;VENTOLIN HFA INHALE 2 PUFFS INTO LUNGS EVERY 4 HOURS AS NEEDED FOR SHORTNESS OF BREATH  aspirin EC 81 MG tablet Take 81 mg by mouth every morning.   budesonide-formoterol 80-4.5 MCG/ACT inhaler Commonly known as:  SYMBICORT INHALE 2 PUFFS BY MOUTH EVERY 12 HOURS TO PREVENT COUGH OR WHEEZING *RINSE MOUTH AFTER USSING*   clonazePAM 0.5 MG tablet Commonly known as:  KLONOPIN Take 1 tablet (0.5 mg total) by mouth 3 (three) times daily as needed for up to 5 days for anxiety.   glucose blood test strip Commonly known as:  ONE TOUCH ULTRA TEST Used to check blood sugars 4x daily.   Insulin Pen Needle 32G X 4 MM Misc Used to give insulin injections twice daily.   insulin regular human CONCENTRATED 500 UNIT/ML kwikpen Commonly known as:  HUMULIN R U-500 KWIKPEN Inject 400 Units into the skin 2 (two) times daily with a meal. What changed:    how much to take  when to take this   ipratropium-albuterol 0.5-2.5 (3) MG/3ML Soln Commonly known as:  DUONEB Take 3 mLs by nebulization every 4 (four) hours as needed (wheezing/shortness of breath).   linaclotide 290  MCG Caps capsule Commonly known as:  LINZESS Take 1 capsule (290 mcg total) by mouth daily before breakfast.   linezolid 600 MG tablet Commonly known as:  ZYVOX Take 1 tablet (600 mg total) by mouth every 12 (twelve) hours for 3 days.   LYRICA 300 MG capsule Generic drug:  pregabalin TAKE 1 CAPSULE BY MOUTH TWICE DAILY FOR NEUROPATHY   metFORMIN 1000 MG tablet Commonly known as:  GLUCOPHAGE Take 1,000 mg by mouth 2 (two) times daily with a meal.   metolazone 2.5 MG tablet Commonly known as:  ZAROXOLYN Take 2.5 mg by mouth every other day. Take 2.5 mg by mouth every 48 hours on Tuesday, Thursday and Saturday   oxyCODONE 5 MG immediate release tablet Commonly known as:  Oxy IR/ROXICODONE Take 1 tablet (5 mg total) by mouth every 4 (four) hours as needed for severe pain.   OXYGEN Inhale 4 L into the lungs at bedtime.   PRESCRIPTION MEDICATION Place 1 drop into both eyes daily as needed (every 14 weeks before injections).   ranitidine 150 MG tablet Commonly known as:  ZANTAC Take 150 mg by mouth 2 (two) times daily.   rOPINIRole 0.5 MG tablet Commonly known as:  REQUIP Take 1 tablet by mouth at bedtime for restless legs. What changed:    how much to take  how to take this  when to take this  additional instructions   Semaglutide 0.25 or 0.5 MG/DOSE Sopn Commonly known as:  OZEMPIC Inject 0.5 mg into the skin once a week.   spironolactone 50 MG tablet Commonly known as:  ALDACTONE Take 50 mg by mouth daily.   torsemide 10 MG tablet Commonly known as:  DEMADEX Take 3 tablets by mouth once daily for swelling and fluid retention. What changed:    how much to take  how to take this  when to take this  additional instructions      Allergies  Allergen Reactions  . Adhesive [Tape] Rash and Other (See Comments)    TAKES OFF THE SKIN (CERTAIN MEDICAL TAPES DO THIS!!)  . Metoprolol Shortness Of Breath    Occurrence of shortness of breath after 3 days  .  Montelukast Shortness Of Breath  . Morphine Sulfate Anaphylaxis, Shortness Of Breath and Nausea And Vomiting    Swollen Throat - Able to tolerate dilaudid  . Penicillins Anaphylaxis, Hives and Shortness Of Breath    Throat  swells Has patient had a PCN reaction causing immediate rash, facial/tongue/throat swelling, SOB or lightheadedness with hypotension: Yes Has patient had a PCN reaction causing severe rash involving mucus membranes or skin necrosis: No Has patient had a PCN reaction that required hospitalization: Yes Has patient had a PCN reaction occurring within the last 10 years: No If all of the above answers are "NO", then may proceed with Cephalosporin use.   . Prednisone Anaphylaxis  . Diltiazem Swelling  . Gabapentin Swelling   Follow-up Information    gyn follow up for elevated hcg level Follow up.        Greer Pickerel, MD. Schedule an appointment as soon as possible for a visit.   Specialty:  General Surgery Why:  Once you are discharged from your facility for follow up with Dr. Redmond Pulling for your wound. Contact information: [address] [place] [address] [phone]        Pleas Koch, NP Follow up.   Specialty:  Internal Medicine Contact information: [place] [address] [phone]        Nigel Mormon, MD Follow up in 1 month(s).   Specialty:  Cardiology Why:  diastolic  chf Contact information: [place] [address] [phone]            The results of significant diagnostics from this hospitalization (including imaging, microbiology, ancillary and laboratory) are listed below for reference.    Significant Diagnostic Studies: Dg Chest 2 View  Result Date: 01/19/2018 CLINICAL DATA:  Pelvic infection.  No chest complaints. EXAM: CHEST - 2 VIEW COMPARISON:  Chest x-ray dated October 19, 2017. FINDINGS: The heart remains at the upper limits of normal in size. Normal pulmonary  vascularity. Mild chronic bronchitic changes are again noted. Unchanged eventration of the right hemidiaphragm. Linear atelectasis in the right middle lobe. No focal consolidation, pleural effusion, or pneumothorax. No acute osseous abnormality. IMPRESSION: Chronic bronchitic changes.  No active cardiopulmonary disease. Electronically Signed   By: Titus Dubin M.D.   On: 01/19/2018 15:59   Dg Lumbar Spine Complete W/bend  Result Date: 01/15/2018 CLINICAL DATA:  Chronic lumbago with bilateral radicular symptoms EXAM: LUMBAR SPINE - COMPLETE WITH BENDING VIEWS COMPARISON:  None. FINDINGS: Frontal, neutral lateral, flexion lateral, extension lateral, spot lumbosacral lateral, and bilateral oblique-total 7-views obtained. There are 5 non-rib-bearing lumbar type vertebral bodies. Bony mineralization is within normal limits. There is no fracture. There is no spondylolisthesis on neutral lateral imaging. There is no change in lateral alignment with flexion-extension. Patient shows essentially normal range of motion with flexion and extension. The disc spaces in the lumbar region appear normal. There are anterior osteophytes at T10, T11, T12, and L1. There is facet osteoarthritic change at L3-4, L4-5, and L5-S1 on the left. Other facets appear unremarkable. There is aortic atherosclerosis. IMPRESSION: Areas of facet osteoarthritic change on the left. No fracture or spondylolisthesis. No appreciable change in lateral alignment with flexion-extension. No appreciable disc space narrowing in the lumbar regions. There is aortic atherosclerosis. Aortic Atherosclerosis (ICD10-I70.0). Electronically Signed   By: Lowella Grip III M.D.   On: 01/15/2018 14:03   Ct Pelvis Wo Contrast  Result Date: 01/19/2018 CLINICAL DATA:  Rash of the left side of the groin. Lump of the vagina. EXAM: CT PELVIS WITHOUT CONTRAST TECHNIQUE: Multidetector CT imaging of the pelvis was performed following the standard protocol without  intravenous contrast. COMPARISON:  03/05/2010 FINDINGS: Urinary Tract: No bladder abnormality seen.  Distal ureters appear negative. Inferior renal poles appear negative. Bowel: Sigmoid diverticulosis without evidence of diverticulitis. No other bowel finding. Vascular/Lymphatic: Atherosclerosis of the aorta and the iliac arteries. No aneurysm. Reproductive: No uterine or adnexal mass or inflammatory change. No abnormality of the vagina identified. Other: Edema and swelling of the mons pubis on the left and the lower left abdominal panniculus consistent with cellulitis. No evidence of drainable deep abscess. Musculoskeletal: Ordinary lower lumbar degenerative changes. Other bones of the region appear normal. IMPRESSION: Swelling and edema of the mons pubis and the lower left abdominal panniculus on the left consistent with cellulitis. No evidence of drainable abscess. Electronically Signed   By: Nelson Chimes M.D.   On: 01/19/2018 17:32    Microbiology: No results found for this or any previous visit (from the past 240 hour(s)).   Labs: Basic Metabolic Panel: Recent Labs  Lab 01/31/18 0219 02/01/18 0314 02/02/18 0302 02/04/18 0323 02/05/18 0227  NA 138 138 138 136 135  K 5.0 5.0 4.8 4.5 4.3  CL 106 104 102 99 95*  CO2 27 28 29 31 28   GLUCOSE 237* 150* 123* 91 249*  BUN 12 14 12 12 14   CREATININE 0.81 0.85 0.97 0.99 1.03*  CALCIUM 8.0* 8.3* 8.4* 8.6* 8.6*  MG  --   --  2.1 2.1  --    Liver Function Tests: Recent Labs  Lab 02/04/18 0323  AST 15  ALT 11  ALKPHOS 101  BILITOT 0.6  PROT 7.5  ALBUMIN 2.1*   No results for input(s): LIPASE, AMYLASE in the last 168 hours. No results for input(s): AMMONIA in the last 168 hours. CBC: Recent Labs  Lab 01/30/18 0432 01/31/18 0219 02/01/18 0314 02/02/18 0302 02/03/18 0838  WBC 12.6* 12.5* 10.0 10.1 10.8*  NEUTROABS  --   --   --   --  8.7*  HGB 8.2* 8.5* 8.4* 8.4* 8.3*  HCT 27.6* 28.8* 28.9* 28.4* 27.6*  MCV 90.2 90.6 90.9 91.3  90.8  PLT 296 279 271 262 268   Cardiac Enzymes: No results for input(s): CKTOTAL, CKMB, CKMBINDEX, TROPONINI in the last 168 hours. BNP: BNP (last 3 results) Recent Labs    03/14/17 1840 07/20/17 2053 10/19/17 2148  BNP 103.4* 29.6 54.6    ProBNP (last 3 results) No results for input(s): PROBNP in the last 8760 hours.  CBG: Recent Labs  Lab 02/04/18 0838 02/04/18 1233 02/04/18 1713 02/04/18 2107 02/05/18 0732  GLUCAP 74 122* 187* 216* 267*       Signed:  Florencia Reasons MD, PhD  [facility] 02/05/2018, 8:55 AM

## 2018-02-05 NOTE — Care Management (Signed)
LTACH referral made during week of 7/22 to [facility] by covering CM.  [facility] gained consent from pt today - pt alert and oriented.  [facility] has bed today;  Room number 3024, Dr Elijio Miles accepting physician, report number [phone]  [facility] confirmed no clinicals required for admitting.

## 2018-02-05 NOTE — Progress Notes (Signed)
Physical Therapy Treatment Patient Details Name: [name] MRN: [id number] DOB: January 28, 1971 Today's Date: 02/05/2018    History of Present Illness Pt is a 47 y.o. female presenting with necrotizing soft tissue infection of mons pubis/vulva and secondary sepsis with encephalopathy. Pt s/p I&D labial/vulvar area on 7/15, 7/19, 7/22. PMHx: COPD, Morbid obesity, COPD, Seizure disorider, Bipolar disorider, Sleep apnea, Essential HTN, Diabetes, IBS, CKD.Marland Kitchen Pt underwent I&D of vulvar wound on monday 7/22.    PT Comments    Pt mobility continues to be limited by vulvar wound pain and body habitus. Pt with increased standing tolerance and ability to take steps to [facility]. Pt with increased anxiety this date as well requiring max verbal cues for slow breathing. Acute PT to continue to follow.    Follow Up Recommendations  SNF;Supervision/Assistance - 24 hour     Equipment Recommendations  Rolling walker with 5" wheels    Recommendations for Other Services       Precautions / Restrictions Precautions Precautions: Fall Precaution Comments: open vulvar wound, 2LO2 via [place] Restrictions Weight Bearing Restrictions: No    Mobility  Bed Mobility Overal bed mobility: Needs Assistance Bed Mobility: Rolling;Sidelying to Sit;Sit to Supine Rolling: Mod assist Sidelying to sit: Max assist;+2 for physical assistance   Sit to supine: Max assist;+2 for physical assistance   General bed mobility comments: pt pulls on spouse to roll bilaterally get on/off bed pan and to transfer to EOB, limited by body habitus and L vulvar wound  Transfers Overall transfer level: Needs assistance Equipment used: Rolling walker (2 wheeled) Transfers: Sit to/from Stand Sit to Stand: Mod assist;+2 physical assistance         General transfer comment: pt able to rock and pull up on walker (with PT holding walker down)  on standing trial of 3 min for hygiene and changing of soiled bed  Ambulation/Gait              General Gait Details: pt took 10 side steps to [facility] with max encouragement and modA for walker management and max directional v/c's   Stairs             Wheelchair Mobility    Modified Rankin (Stroke Patients Only)       Balance Overall balance assessment: Needs assistance Sitting-balance support: Single extremity supported;Feet supported Sitting balance-Leahy Scale: Poor Sitting balance - Comments: min assist for balance due to posterior lean Postural control: Posterior lean Standing balance support: Bilateral upper extremity supported Standing balance-Leahy Scale: Poor Standing balance comment: bil UE support with max assist for anterior translation to get weigth on feet                            Cognition Arousal/Alertness: Awake/alert Behavior During Therapy: Anxious Overall Cognitive Status: Impaired/Different from baseline Area of Impairment: Problem solving;Safety/judgement;Memory                           Awareness: Emergent Problem Solving: Slow processing;Decreased initiation;Difficulty sequencing;Requires verbal cues;Requires tactile cues General Comments: pt reports during standing "I'm having an anxiety attack or something" pt given instructions for deep breathing      Exercises      General Comments General comments (skin integrity, edema, etc.): pt continues to have swollen vagina and L vulvar wound with soiled dressing      Pertinent Vitals/Pain Pain Assessment: Faces Faces Pain Scale: Hurts even more Pain Location: groin with  sitting Pain Descriptors / Indicators: Grimacing;Aching;Pressure Pain Intervention(s): Limited activity within patient's tolerance    Home Living                      Prior Function            PT Goals (current goals can now be found in the care plan section) Progress towards PT goals: Progressing toward goals    Frequency    Min 3X/week      PT Plan Current plan remains  appropriate    Co-evaluation              AM-PAC PT "6 Clicks" Daily Activity  Outcome Measure  Difficulty turning over in bed (including adjusting bedclothes, sheets and blankets)?: Unable Difficulty moving from lying on back to sitting on the side of the bed? : Unable Difficulty sitting down on and standing up from a chair with arms (e.g., wheelchair, bedside commode, etc,.)?: Unable Help needed moving to and from a bed to chair (including a wheelchair)?: A Lot Help needed walking in hospital room?: A Lot Help needed climbing 3-5 steps with a railing? : Total 6 Click Score: 8    End of Session Equipment Utilized During Treatment: Gait belt Activity Tolerance: Patient tolerated treatment well Patient left: in bed;with call bell/phone within reach Nurse Communication: Mobility status PT Visit Diagnosis: Unsteadiness on feet (R26.81);Other abnormalities of gait and mobility (R26.89);Muscle weakness (generalized) (M62.81);History of falling (Z91.81)     Time: 7353-2992 PT Time Calculation (min) (ACUTE ONLY): 27 min  Charges:  $Therapeutic Activity: 23-37 mins                     Kittie Plater, PT, DPT Pager #: [phone] Office #: [phone]    [name] 02/05/2018, 9:49 AM

## 2018-02-06 ENCOUNTER — Ambulatory Visit: Payer: BLUE CROSS/BLUE SHIELD | Admitting: Endocrinology

## 2018-02-26 DIAGNOSIS — R102 Pelvic and perineal pain: Secondary | ICD-10-CM | POA: Diagnosis not present

## 2018-02-26 DIAGNOSIS — I509 Heart failure, unspecified: Secondary | ICD-10-CM | POA: Diagnosis not present

## 2018-02-26 DIAGNOSIS — I69354 Hemiplegia and hemiparesis following cerebral infarction affecting left non-dominant side: Secondary | ICD-10-CM | POA: Diagnosis not present

## 2018-02-26 DIAGNOSIS — M6281 Muscle weakness (generalized): Secondary | ICD-10-CM | POA: Diagnosis not present

## 2018-02-26 DIAGNOSIS — N7681 Mucositis (ulcerative) of vagina and vulva: Secondary | ICD-10-CM | POA: Diagnosis not present

## 2018-02-26 DIAGNOSIS — E873 Alkalosis: Secondary | ICD-10-CM | POA: Diagnosis not present

## 2018-02-26 DIAGNOSIS — N189 Chronic kidney disease, unspecified: Secondary | ICD-10-CM | POA: Diagnosis not present

## 2018-02-26 DIAGNOSIS — G894 Chronic pain syndrome: Secondary | ICD-10-CM | POA: Diagnosis not present

## 2018-02-26 DIAGNOSIS — G8912 Acute post-thoracotomy pain: Secondary | ICD-10-CM | POA: Diagnosis not present

## 2018-02-26 DIAGNOSIS — N764 Abscess of vulva: Secondary | ICD-10-CM | POA: Diagnosis not present

## 2018-02-26 DIAGNOSIS — Z6841 Body Mass Index (BMI) 40.0 and over, adult: Secondary | ICD-10-CM | POA: Diagnosis not present

## 2018-02-26 DIAGNOSIS — T8189XA Other complications of procedures, not elsewhere classified, initial encounter: Secondary | ICD-10-CM | POA: Diagnosis not present

## 2018-02-26 DIAGNOSIS — M726 Necrotizing fasciitis: Secondary | ICD-10-CM | POA: Diagnosis not present

## 2018-02-26 DIAGNOSIS — F1120 Opioid dependence, uncomplicated: Secondary | ICD-10-CM | POA: Diagnosis not present

## 2018-02-26 DIAGNOSIS — A4181 Sepsis due to Enterococcus: Secondary | ICD-10-CM | POA: Diagnosis not present

## 2018-02-26 DIAGNOSIS — S31502A Unspecified open wound of unspecified external genital organs, female, initial encounter: Secondary | ICD-10-CM | POA: Diagnosis not present

## 2018-02-26 DIAGNOSIS — J9610 Chronic respiratory failure, unspecified whether with hypoxia or hypercapnia: Secondary | ICD-10-CM | POA: Diagnosis not present

## 2018-02-26 DIAGNOSIS — G8922 Chronic post-thoracotomy pain: Secondary | ICD-10-CM | POA: Diagnosis not present

## 2018-02-26 DIAGNOSIS — E119 Type 2 diabetes mellitus without complications: Secondary | ICD-10-CM | POA: Diagnosis not present

## 2018-02-26 DIAGNOSIS — R262 Difficulty in walking, not elsewhere classified: Secondary | ICD-10-CM | POA: Diagnosis not present

## 2018-03-14 ENCOUNTER — Telehealth: Payer: Self-pay | Admitting: Internal Medicine

## 2018-03-14 NOTE — Telephone Encounter (Signed)
Patient spouse calling stating patient is in hospital and not sure when she will be released but would like a call back to reschedule the upcoming exams   Please advise

## 2018-03-14 NOTE — Telephone Encounter (Signed)
Returned called to patient's spouse and gave contact info to call and cancel PFT. He was informed that they may call and reschedule when ready.

## 2018-03-18 DIAGNOSIS — G4733 Obstructive sleep apnea (adult) (pediatric): Secondary | ICD-10-CM | POA: Diagnosis not present

## 2018-03-20 DIAGNOSIS — R531 Weakness: Secondary | ICD-10-CM | POA: Diagnosis not present

## 2018-03-20 DIAGNOSIS — E1129 Type 2 diabetes mellitus with other diabetic kidney complication: Secondary | ICD-10-CM | POA: Diagnosis not present

## 2018-03-20 DIAGNOSIS — M7989 Other specified soft tissue disorders: Secondary | ICD-10-CM | POA: Diagnosis not present

## 2018-03-20 DIAGNOSIS — E1165 Type 2 diabetes mellitus with hyperglycemia: Secondary | ICD-10-CM | POA: Diagnosis not present

## 2018-03-22 ENCOUNTER — Ambulatory Visit: Payer: BLUE CROSS/BLUE SHIELD

## 2018-04-03 ENCOUNTER — Telehealth: Payer: Self-pay | Admitting: Primary Care

## 2018-04-03 DIAGNOSIS — T8189XA Other complications of procedures, not elsewhere classified, initial encounter: Secondary | ICD-10-CM | POA: Diagnosis not present

## 2018-04-03 NOTE — Telephone Encounter (Signed)
Copied from [place] [id number]. Topic: Quick Communication - See Telephone Encounter >> Apr 03, 2018  1:18 PM Conception Chancy, NT wrote: CRM for notification. See Telephone encounter for: 04/03/18.  [name] is a physical therapist calling from [facility] requesting verbal orders 2x a week for 5 weeks affective 03/31/18.  Cb# [phone]

## 2018-04-03 NOTE — Telephone Encounter (Signed)
Approved.  

## 2018-04-03 NOTE — Telephone Encounter (Signed)
Copied from [place] [id number]. Topic: Quick Communication - See Telephone Encounter >> Apr 03, 2018 10:43 AM Ahmed Prima L wrote: CRM for notification. See Telephone encounter for: 04/03/18.  Jerald Kief called from [facility]. She states the patient was in their program until she had to go into the hospital. She has been discharged back home and her husband would like for her to resume her services with them. Please contact [place] @ [phone]

## 2018-04-03 NOTE — Telephone Encounter (Signed)
Randal Buba the approval for the verbal order. No new referral needed.

## 2018-04-03 NOTE — Telephone Encounter (Signed)
Approved, does she need a new referral?

## 2018-04-04 ENCOUNTER — Telehealth: Payer: Self-pay | Admitting: Primary Care

## 2018-04-04 DIAGNOSIS — F410 Panic disorder [episodic paroxysmal anxiety] without agoraphobia: Secondary | ICD-10-CM | POA: Diagnosis not present

## 2018-04-04 NOTE — Telephone Encounter (Signed)
Copied from [place] [id number]. Topic: General - Other >> Apr 04, 2018  3:13 PM Lennox Solders wrote: Reason for CRM: amanda OT [facility] is calling and would like verbal order for OT  twice a wk for 4 wks

## 2018-04-04 NOTE — Telephone Encounter (Signed)
Approved.  

## 2018-04-04 NOTE — Telephone Encounter (Signed)
Copied from [place] [id number]. Topic: Quick Communication - See Telephone Encounter >> Apr 04, 2018  2:56 PM Ivar Drape wrote: CRM for notification. See Telephone encounter for: 04/04/18. Nicolette LPN w/[facility] [phone] needs order for Homehealth skilled nursing 2w3, 1w2, 3prn visits for disease management and wound and drain care.

## 2018-04-04 NOTE — Telephone Encounter (Signed)
Message left for Taitiana to return my call.

## 2018-04-05 ENCOUNTER — Telehealth: Payer: Self-pay | Admitting: Primary Care

## 2018-04-05 ENCOUNTER — Telehealth: Payer: Self-pay | Admitting: Endocrinology

## 2018-04-05 ENCOUNTER — Ambulatory Visit (INDEPENDENT_AMBULATORY_CARE_PROVIDER_SITE_OTHER): Payer: BLUE CROSS/BLUE SHIELD | Admitting: Primary Care

## 2018-04-05 ENCOUNTER — Encounter: Payer: Self-pay | Admitting: Primary Care

## 2018-04-05 VITALS — BP 122/70 | HR 96 | Temp 98.1°F | Ht 64.0 in | Wt 249.8 lb

## 2018-04-05 DIAGNOSIS — E1165 Type 2 diabetes mellitus with hyperglycemia: Secondary | ICD-10-CM

## 2018-04-05 DIAGNOSIS — M7989 Other specified soft tissue disorders: Secondary | ICD-10-CM | POA: Diagnosis not present

## 2018-04-05 DIAGNOSIS — E0801 Diabetes mellitus due to underlying condition with hyperosmolarity with coma: Secondary | ICD-10-CM | POA: Diagnosis not present

## 2018-04-05 DIAGNOSIS — Z09 Encounter for follow-up examination after completed treatment for conditions other than malignant neoplasm: Secondary | ICD-10-CM

## 2018-04-05 NOTE — Telephone Encounter (Signed)
Spoken to [employer]. She will check with [facility] regarding the commode and butterfly pad. She will let us know. Arville Go will call patient's endocrinology and give the info below.

## 2018-04-05 NOTE — Telephone Encounter (Signed)
Pt has HFU with Gentry Fitz NP 04/05/18 at 9:40.

## 2018-04-05 NOTE — Assessment & Plan Note (Signed)
Uncontrolled which is likely due to alteration in her diabetes medications while in nursing rehabilitation. Stop insulin degludec. Resume prior diabetes regimen which included semaglutide and Humulin R 400 units BID. She will monitor her glucose and follow up with endocrinology.

## 2018-04-05 NOTE — Progress Notes (Signed)
Subjective:    Patient ID: [name], female    DOB: [birth year], 47 y.o.   MRN: [id number]  HPI  [name] is a 48 year old female with a history of COPD, poorly controlled type 2 diabetes, CKD, CHF, hypertension who presents today for hospital follow up.   She presented to [facility] on 01/19/18 with a three day history of rash with warmth to the left groin area. She'd been apply antibiotic ointment to the area without improvement. She was diagnosed with cellulitis with possible sepsis and sent to the emergency for further evaluation.  She presented to [facility] and was diagnosed with cellulitis with sepsis. She was febrile, tachycardic, had lactic acid of 3.06 with leukocytosis. CT abdomen/pelvis with evidence of cellulitis. She was initiated on a multi-antibiotic regimen IV and admitted. Diagnosed with necrotizing soft tissue infection to the groin. Underwent irrigation and debridement of labial/vulva area on 01/22/18. She underwent further debridement of vulvar region on 01/26/18 and again on 07/22. Treated with dressing changes and hydrotherapy. She was discharged to SNF for rehabilitation on 02/05/18.  She was discharged home from [facility] 6 days ago. She has not yet seen her surgeron, she is scheduled for an appointment next Tuesday for removal of JP drain and evaluation. The drainage from her JP drain is clear/yellow and has not changed.   She has not yet scheduled a follow up visit with her cardiologist Dr. Shanon Brow with [facility], or endocrinologist. During her stay in rehab her insulin regimen was changed to semaglutide 1 mg daily and insulin degludec 200 units daily. She was previously managed on Humulin R 500 units/ml and injecting 400 units BID. During her stay in rehab her glucose was uncontrolled with readings in the 500's.   She's checking her blood sugars three times daily: AM fasting: 500's Before lunch: 500's Before dinner: 500's  BP Readings from  Last 3 Encounters:  04/05/18 122/70  02/05/18 135/69  01/19/18 (!) 98/43   She's having difficulty walking due to weakness from deconditioning. She has home health physical therapy coming to her home for a second visit today. She is requesting butterfly pads to use for irritation to the buttocks. She's sitting for most of her day, does try to lay on her side to reduce pressure. Her husband has been using zinc oxide and gauze recently to the site. She denies fevers, drainage. She is requesting a better bedside commode that will fit over her toilet.  Review of Systems  Constitutional: Negative for fever.  Respiratory:       Denies increased shortness of breath  Cardiovascular: Negative for chest pain.  Gastrointestinal: Negative for abdominal pain and nausea.  Neurological: Positive for weakness.  Psychiatric/Behavioral:       Feels well managed on current regimen.       Past Medical History:  Diagnosis Date  . Anxiety    takes Prozac daily  . Anxiety   . Aortic valve calcification   . Asthma    Advair and Spirva daily  . Asthma   . Bipolar disorder ([place])   . CAD (coronary artery disease)    a. LHC 11/2013 done for CP/fluid retention: mild disease in prox LAD, mild-mod disease in mRCA, EF 60% with normal LVEDP. b. Normal nuc 03/2016.  Marland Kitchen CHF (congestive heart failure) ([place])   . Chronic diastolic CHF (congestive heart failure) ([place])   . CKD (chronic kidney disease), stage II   . COPD (chronic obstructive pulmonary disease) ([place])  a. nocturnal O2.  Marland Kitchen COPD (chronic obstructive pulmonary disease) ([place])   . Coronary artery disease   . Diabetes mellitus   . Diabetes mellitus without complication ([place])   . Dyspnea   . Family history of adverse reaction to anesthesia    mom gets nauseated  . GERD (gastroesophageal reflux disease)    takes Pepcid daily  . History of blood clots    left leg 3-69yrs ago  . Hyperlipidemia   . Hypertension   . Hypertriglyceridemia   . Inguinal  hernia, left 01/2015  . Muscle spasm   . Peripheral neuropathy   . RBBB   . Seizures ([place])   . Sinus tachycardia    a. persistent since 2009.  Marland Kitchen Smokers' cough ([place])   . Stroke [facility]) 1989   left sided weakness  . TIA (transient ischemic attack)   . Tobacco abuse      Social History   Socioeconomic History  . Marital status: Married    Spouse name: Not on file  . Number of children: Not on file  . Years of education: Not on file  . Highest education level: Not on file  Occupational History  . Not on file  Social Needs  . Financial resource strain: Not on file  . Food insecurity:    Worry: Not on file    Inability: Not on file  . Transportation needs:    Medical: Not on file    Non-medical: Not on file  Tobacco Use  . Smoking status: Former Smoker    Packs/day: 1.00    Years: 36.00    Pack years: 36.00    Types: Cigarettes    Last attempt to quit: 01/18/2018    Years since quitting: 0.2  . Smokeless tobacco: Never Used  Substance and Sexual Activity  . Alcohol use: No    Frequency: Never  . Drug use: No  . Sexual activity: Not Currently  Lifestyle  . Physical activity:    Days per week: Not on file    Minutes per session: Not on file  . Stress: Not on file  Relationships  . Social connections:    Talks on phone: Not on file    Gets together: Not on file    Attends religious service: Not on file    Active member of club or organization: Not on file    Attends meetings of clubs or organizations: Not on file    Relationship status: Not on file  . Intimate partner violence:    Fear of current or ex partner: Not on file    Emotionally abused: Not on file    Physically abused: Not on file    Forced sexual activity: Not on file  Other Topics Concern  . Not on file  Social History Narrative   ** Merged History Encounter **       Lives in [place]   Married but lives with Boyfriend - not legally separated   Disabled - for BiPolar, Seizure disorder, diabetes     Formerly worked at [employer]          Past Surgical History:  Procedure Laterality Date  . HERNIA REPAIR    . INCISION AND DRAINAGE ABSCESS N/A 01/26/2018   Procedure: INCISION AND DEBRIDEMENT OF VULVAR NECROTIZING SOFT TISSUE INFECTION;  Surgeon: Greer Pickerel, MD;  Location: [place];  Service: General;  Laterality: N/A;  . INCISION AND DRAINAGE PERIRECTAL ABSCESS N/A 01/22/2018   Procedure: IRRIGATION AND DEBRIDEMENT LABIAL/VULVAR AREA;  Surgeon:  Coralie Keens, MD;  Location: [place];  Service: General;  Laterality: N/A;  . INCISION AND DRAINAGE PERIRECTAL ABSCESS N/A 01/29/2018   Procedure: IRRIGATION AND DEBRIDEMENT VULVA;  Surgeon: Excell Seltzer, MD;  Location: [place];  Service: General;  Laterality: N/A;  . INGUINAL HERNIA REPAIR Left 04/08/2015   Procedure: OPEN LEFT INGUINAL HERNIA REPAIR WITH MESH;  Surgeon: Ralene Ok, MD;  Location: [place];  Service: General;  Laterality: Left;  . INSERTION OF MESH Left 04/08/2015   Procedure: INSERTION OF MESH;  Surgeon: Ralene Ok, MD;  Location: [place];  Service: General;  Laterality: Left;  . LAPAROSCOPY     Endometriosis  . LEFT HEART CATHETERIZATION WITH CORONARY ANGIOGRAM N/A 12/05/2013   Procedure: LEFT HEART CATHETERIZATION WITH CORONARY ANGIOGRAM;  Surgeon: Jettie Booze, MD;  Location: [facility] CATH LAB;  Service: Cardiovascular;  Laterality: N/A;  . right kidney drained    . TEE WITHOUT CARDIOVERSION N/A 11/28/2013   Procedure: TRANSESOPHAGEAL ECHOCARDIOGRAM (TEE);  Surgeon: Thayer Headings, MD;  Location: [facility] ENDOSCOPY;  Service: Cardiovascular;  Laterality: N/A;    Family History  Problem Relation Age of Onset  . Venous thrombosis Brother   . Other Brother        BRAIN TUMOR  . Asthma Father   . Diabetes Father   . Coronary artery disease Mother   . Hypertension Mother   . Diabetes Mother   . Asthma Sister   . Diabetes type II Brother     Allergies  Allergen Reactions  . Adhesive [Tape] Rash and Other (See  Comments)    TAKES OFF THE SKIN (CERTAIN MEDICAL TAPES DO THIS!!)  . Metoprolol Shortness Of Breath    Occurrence of shortness of breath after 3 days  . Montelukast Shortness Of Breath  . Morphine Sulfate Anaphylaxis, Shortness Of Breath and Nausea And Vomiting    Swollen Throat - Able to tolerate dilaudid  . Penicillins Anaphylaxis, Hives and Shortness Of Breath    Throat swells Has patient had a PCN reaction causing immediate rash, facial/tongue/throat swelling, SOB or lightheadedness with hypotension: Yes Has patient had a PCN reaction causing severe rash involving mucus membranes or skin necrosis: No Has patient had a PCN reaction that required hospitalization: Yes Has patient had a PCN reaction occurring within the last 10 years: No If all of the above answers are "NO", then may proceed with Cephalosporin use.   . Prednisone Anaphylaxis  . Diltiazem Swelling  . Gabapentin Swelling    Current Outpatient Medications on File Prior to Visit  Medication Sig Dispense Refill  . acetaminophen (TYLENOL) 500 MG tablet Take 500 mg by mouth every 4 (four) hours as needed.    Marland Kitchen albuterol (PROVENTIL HFA;VENTOLIN HFA) 108 (90 Base) MCG/ACT inhaler INHALE 2 PUFFS INTO LUNGS EVERY 4 HOURS AS NEEDED FOR SHORTNESS OF BREATH (Patient taking differently: Inhale 2 puffs into the lungs every 6 (six) hours as needed. INHALE 2 PUFFS INTO LUNGS EVERY 4 HOURS AS NEEDED FOR SHORTNESS OF BREATH) 6.7 g 0  . ALPRAZolam (XANAX) 1 MG tablet Take 1 mg by mouth 3 (three) times daily as needed for anxiety.    Marland Kitchen aspirin EC 81 MG tablet Take 81 mg by mouth every morning.    . budesonide (PULMICORT) 0.5 MG/2ML nebulizer solution Take 0.5 mg by nebulization 2 (two) times daily.    . budesonide-formoterol (SYMBICORT) 80-4.5 MCG/ACT inhaler INHALE 2 PUFFS BY MOUTH EVERY 12 HOURS TO PREVENT COUGH OR WHEEZING *RINSE MOUTH AFTER USSING* 10.2 g  0  . busPIRone (BUSPAR) 30 MG tablet Take 30 mg by mouth 2 (two) times daily.    .  famotidine (PEPCID) 20 MG tablet Take 20 mg by mouth 2 (two) times daily.    . fentaNYL (DURAGESIC - DOSED MCG/HR) 25 MCG/HR patch Place 25 mcg onto the skin every 3 (three) days.    Marland Kitchen glucose blood (ONE TOUCH ULTRA TEST) test strip Used to check blood sugars 4x daily. 200 each 4  . Insulin Pen Needle 32G X 4 MM MISC Used to give insulin injections twice daily. 100 each 11  . insulin regular human CONCENTRATED (HUMULIN R U-500 KWIKPEN) 500 UNIT/ML kwikpen Inject 400 Units into the skin 2 (two) times daily with a meal. (Patient taking differently: Inject 200-500 Units into the skin 2 (two) times daily before a meal. ) 16 pen 11  . ipratropium-albuterol (DUONEB) 0.5-2.5 (3) MG/3ML SOLN Take 3 mLs by nebulization every 4 (four) hours as needed (wheezing/shortness of breath). 360 mL 0  . loperamide (IMODIUM A-D) 2 MG tablet Take 4 mg by mouth 3 (three) times daily as needed for diarrhea or loose stools.    Marland Kitchen LYRICA 300 MG capsule TAKE 1 CAPSULE BY MOUTH TWICE DAILY FOR NEUROPATHY 180 capsule 0  . metFORMIN (GLUCOPHAGE) 1000 MG tablet Take 1,000 mg by mouth 2 (two) times daily with a meal.    . metolazone (ZAROXOLYN) 2.5 MG tablet Take 2.5 mg by mouth every other day. Take 2.5 mg by mouth every 48 hours on Tuesday, Thursday and Saturday    . oxyCODONE (OXY IR/ROXICODONE) 5 MG immediate release tablet Take 1 tablet (5 mg total) by mouth every 4 (four) hours as needed for severe pain. 5 tablet 0  . OXYGEN Inhale 4 L into the lungs at bedtime.     Marland Kitchen PRESCRIPTION MEDICATION Place 1 drop into both eyes daily as needed (every 14 weeks before injections).    . ranitidine (ZANTAC) 150 MG tablet Take 150 mg by mouth 2 (two) times daily.     Marland Kitchen rOPINIRole (REQUIP) 0.5 MG tablet Take 1 tablet by mouth at bedtime for restless legs. 90 tablet 0  . Semaglutide (OZEMPIC) 0.25 or 0.5 MG/DOSE SOPN Inject 0.5 mg into the skin once a week. 4 pen 11  . sodium hypochlorite (DAKIN'S 1/4 STRENGTH) 0.125 % SOLN Irrigate with as  directed 2 (two) times daily. 473 mL 0  . spironolactone (ALDACTONE) 50 MG tablet Take 50 mg by mouth daily.    Marland Kitchen torsemide (DEMADEX) 10 MG tablet Take 3 tablets by mouth once daily for swelling and fluid retention. (Patient taking differently: Take 20-40 mg by mouth 2 (two) times daily. 40 mg in the morning and 20 mg in the evening) 90 tablet 0  . traZODone (DESYREL) 50 MG tablet Take 50-150 mg by mouth at bedtime.    Marland Kitchen linaclotide (LINZESS) 290 MCG CAPS capsule Take 1 capsule (290 mcg total) by mouth daily before breakfast. 90 capsule 0   No current facility-administered medications on file prior to visit.     BP 122/70   Pulse 96   Temp 98.1 F (36.7 C) (Oral)   Ht 5\' 4"  (1.626 m)   Wt 249 lb 12 oz (113.3 kg)   LMP 11/11/2011   SpO2 95%   BMI 42.87 kg/m    Objective:   Physical Exam  Constitutional: She is oriented to person, place, and time. She appears well-nourished. She does not appear ill.  Neck: Neck supple.  Cardiovascular:  Normal rate and regular rhythm.  Respiratory: Effort normal and breath sounds normal. She has no wheezes.  GI: Soft. Bowel sounds are normal. There is no tenderness.  Surgical drain in place. No surrounding erythema, drainage, or tenderness to site.  Neurological: She is alert and oriented to person, place, and time.  Skin: Skin is warm and dry.  Psychiatric: She has a normal mood and affect.           Assessment & Plan:

## 2018-04-05 NOTE — Telephone Encounter (Signed)
Patient evaluated today for hospital follow up and a few things need addressed:  1. Approved "3 in 1" commode. How do we go about getting this? Will Joann get this or do we need to fax a prescription to the home supply store? 2. Notify Joann that patient will resume her Humilin N 500 units/ml per endocrinology. Patient understands this and will be monitoring her blood sugars.  3. The patient has a sore to the sacral area which is tender given daily pressure from sitting. They are requesting "butterfly" pads that adhere to the skin. How do we go about getting these?  Let me know, thanks.

## 2018-04-05 NOTE — Assessment & Plan Note (Signed)
With sepsis. Admitted to [facility], discharged to SNF for 2 months.   Today she appears well. Surgical site appears well without signs of infection. JP drain in place. She has an appointment with her surgeon next week. Goal is to regain control of her diabetes in order to reduce the risk for recurrent infection.  Home health nursing and PT initiated. Will call home health with requests for new home commode and butterfly dressings.   All hospital notes, labs, imaging reviewed.

## 2018-04-05 NOTE — Patient Instructions (Addendum)
Resume your insulin as Dr. Loanne Drilling prescribed.  Schedule a follow up visit with your cardiologist and endocrinologist.  We will be in touch with home health.  You will be contacted regarding your referral to the foot doctor.  Please let us know if you have not been contacted within one week.   It was a pleasure to see you today!

## 2018-04-05 NOTE — Telephone Encounter (Signed)
Not needed

## 2018-04-05 NOTE — Telephone Encounter (Signed)
Copied from [place] [id number]. Topic: Quick Communication - See Telephone Encounter >> Apr 05, 2018  8:23 AM Alfredia Ferguson R wrote: [name] with [facility] is calling in to advise Dr Carlis Abbott that [facility] had a lengthy hospitalization at [facility] and just came home from a nursing home last week shes bring in a copy of med list from nursing home discharge. Dr Carlis Abbott is allowed to make changes to  medication. [name] is having difficulty walking due to hospitalization. She is requesting a 3 in 1 commode. [name] blood sugars are still ranging in the 500s she hasnt been able to get in with Endocrinologist. Remo Lipps will try to call endocrinologist to get her in asap for high blood sugars.  Cb# [id number]

## 2018-04-06 ENCOUNTER — Telehealth: Payer: Self-pay

## 2018-04-06 NOTE — Telephone Encounter (Signed)
Opened in error

## 2018-04-06 NOTE — Telephone Encounter (Signed)
Spoke with patient about her referral and she asked about the commode as listed below. She states nurse with [facility] came out to her today and asked her about this but patient told the nurse she has not heard from [place] or anyone else about this. Please let patient know the status. Thank [name], RMA

## 2018-04-06 NOTE — Telephone Encounter (Signed)
Message left for [name] to return my call. 

## 2018-04-06 NOTE — Telephone Encounter (Signed)
Tatiana notified as instructed and voiced understanding. Nothing further needed.

## 2018-04-06 NOTE — Telephone Encounter (Signed)
Vallarie Mare, can you add any information regarding the commode? I thought you spoke with Joann. It's approved, I just need to know how to go about getting this for her. Thanks.

## 2018-04-06 NOTE — Telephone Encounter (Signed)
Message left for [name] to return my call.

## 2018-04-06 NOTE — Telephone Encounter (Addendum)
Tried to call [name] to return my call but voicemail box is full

## 2018-04-10 NOTE — Telephone Encounter (Addendum)
Message left for [name] to return my call. 

## 2018-04-10 NOTE — Telephone Encounter (Signed)
Noted, Rx placed in Chan's inbox. 

## 2018-04-10 NOTE — Telephone Encounter (Signed)
Faxed Rx to [facility].

## 2018-04-10 NOTE — Telephone Encounter (Signed)
Gave the approval for the verbal orders to [facility]

## 2018-04-10 NOTE — Telephone Encounter (Signed)
Spoken to [name] this morning regarding the commode and "butterfly" pads. She stated that they do some of the DME orders, they just need Rx for them.  Will need to fax to [phone] attn Tine Sain at [facility].

## 2018-04-11 DIAGNOSIS — J449 Chronic obstructive pulmonary disease, unspecified: Secondary | ICD-10-CM | POA: Diagnosis not present

## 2018-04-11 DIAGNOSIS — I2510 Atherosclerotic heart disease of native coronary artery without angina pectoris: Secondary | ICD-10-CM | POA: Diagnosis not present

## 2018-04-11 DIAGNOSIS — M726 Necrotizing fasciitis: Secondary | ICD-10-CM | POA: Diagnosis not present

## 2018-04-11 DIAGNOSIS — I69354 Hemiplegia and hemiparesis following cerebral infarction affecting left non-dominant side: Secondary | ICD-10-CM | POA: Diagnosis not present

## 2018-04-11 DIAGNOSIS — I130 Hypertensive heart and chronic kidney disease with heart failure and stage 1 through stage 4 chronic kidney disease, or unspecified chronic kidney disease: Secondary | ICD-10-CM | POA: Diagnosis not present

## 2018-04-11 DIAGNOSIS — E1142 Type 2 diabetes mellitus with diabetic polyneuropathy: Secondary | ICD-10-CM | POA: Diagnosis not present

## 2018-04-11 DIAGNOSIS — E1122 Type 2 diabetes mellitus with diabetic chronic kidney disease: Secondary | ICD-10-CM | POA: Diagnosis not present

## 2018-04-11 DIAGNOSIS — J9611 Chronic respiratory failure with hypoxia: Secondary | ICD-10-CM | POA: Diagnosis not present

## 2018-04-11 DIAGNOSIS — N182 Chronic kidney disease, stage 2 (mild): Secondary | ICD-10-CM | POA: Diagnosis not present

## 2018-04-11 DIAGNOSIS — F411 Generalized anxiety disorder: Secondary | ICD-10-CM | POA: Diagnosis not present

## 2018-04-11 DIAGNOSIS — D631 Anemia in chronic kidney disease: Secondary | ICD-10-CM | POA: Diagnosis not present

## 2018-04-11 DIAGNOSIS — I5032 Chronic diastolic (congestive) heart failure: Secondary | ICD-10-CM | POA: Diagnosis not present

## 2018-04-11 NOTE — Telephone Encounter (Signed)
Spoken to [facility] and they are aware of the approval of the verbal order.

## 2018-04-12 DIAGNOSIS — D631 Anemia in chronic kidney disease: Secondary | ICD-10-CM | POA: Diagnosis not present

## 2018-04-12 DIAGNOSIS — J9611 Chronic respiratory failure with hypoxia: Secondary | ICD-10-CM | POA: Diagnosis not present

## 2018-04-12 DIAGNOSIS — I5032 Chronic diastolic (congestive) heart failure: Secondary | ICD-10-CM | POA: Diagnosis not present

## 2018-04-12 DIAGNOSIS — E1122 Type 2 diabetes mellitus with diabetic chronic kidney disease: Secondary | ICD-10-CM | POA: Diagnosis not present

## 2018-04-12 DIAGNOSIS — T8189XA Other complications of procedures, not elsewhere classified, initial encounter: Secondary | ICD-10-CM | POA: Diagnosis not present

## 2018-04-12 DIAGNOSIS — F411 Generalized anxiety disorder: Secondary | ICD-10-CM | POA: Diagnosis not present

## 2018-04-12 DIAGNOSIS — E1142 Type 2 diabetes mellitus with diabetic polyneuropathy: Secondary | ICD-10-CM | POA: Diagnosis not present

## 2018-04-12 DIAGNOSIS — I2510 Atherosclerotic heart disease of native coronary artery without angina pectoris: Secondary | ICD-10-CM | POA: Diagnosis not present

## 2018-04-12 DIAGNOSIS — J449 Chronic obstructive pulmonary disease, unspecified: Secondary | ICD-10-CM | POA: Diagnosis not present

## 2018-04-12 DIAGNOSIS — N182 Chronic kidney disease, stage 2 (mild): Secondary | ICD-10-CM | POA: Diagnosis not present

## 2018-04-12 DIAGNOSIS — I130 Hypertensive heart and chronic kidney disease with heart failure and stage 1 through stage 4 chronic kidney disease, or unspecified chronic kidney disease: Secondary | ICD-10-CM | POA: Diagnosis not present

## 2018-04-12 DIAGNOSIS — I69354 Hemiplegia and hemiparesis following cerebral infarction affecting left non-dominant side: Secondary | ICD-10-CM | POA: Diagnosis not present

## 2018-04-12 DIAGNOSIS — M726 Necrotizing fasciitis: Secondary | ICD-10-CM | POA: Diagnosis not present

## 2018-04-13 DIAGNOSIS — J9611 Chronic respiratory failure with hypoxia: Secondary | ICD-10-CM | POA: Diagnosis not present

## 2018-04-13 DIAGNOSIS — F411 Generalized anxiety disorder: Secondary | ICD-10-CM | POA: Diagnosis not present

## 2018-04-13 DIAGNOSIS — J449 Chronic obstructive pulmonary disease, unspecified: Secondary | ICD-10-CM | POA: Diagnosis not present

## 2018-04-13 DIAGNOSIS — D631 Anemia in chronic kidney disease: Secondary | ICD-10-CM | POA: Diagnosis not present

## 2018-04-13 DIAGNOSIS — N182 Chronic kidney disease, stage 2 (mild): Secondary | ICD-10-CM | POA: Diagnosis not present

## 2018-04-13 DIAGNOSIS — I130 Hypertensive heart and chronic kidney disease with heart failure and stage 1 through stage 4 chronic kidney disease, or unspecified chronic kidney disease: Secondary | ICD-10-CM | POA: Diagnosis not present

## 2018-04-13 DIAGNOSIS — I5032 Chronic diastolic (congestive) heart failure: Secondary | ICD-10-CM | POA: Diagnosis not present

## 2018-04-13 DIAGNOSIS — I2510 Atherosclerotic heart disease of native coronary artery without angina pectoris: Secondary | ICD-10-CM | POA: Diagnosis not present

## 2018-04-13 DIAGNOSIS — E1142 Type 2 diabetes mellitus with diabetic polyneuropathy: Secondary | ICD-10-CM | POA: Diagnosis not present

## 2018-04-13 DIAGNOSIS — I69354 Hemiplegia and hemiparesis following cerebral infarction affecting left non-dominant side: Secondary | ICD-10-CM | POA: Diagnosis not present

## 2018-04-13 DIAGNOSIS — E1122 Type 2 diabetes mellitus with diabetic chronic kidney disease: Secondary | ICD-10-CM | POA: Diagnosis not present

## 2018-04-13 DIAGNOSIS — M726 Necrotizing fasciitis: Secondary | ICD-10-CM | POA: Diagnosis not present

## 2018-04-16 ENCOUNTER — Ambulatory Visit (INDEPENDENT_AMBULATORY_CARE_PROVIDER_SITE_OTHER): Payer: Medicare Other | Admitting: Podiatry

## 2018-04-16 ENCOUNTER — Encounter: Payer: Self-pay | Admitting: Podiatry

## 2018-04-16 VITALS — BP 126/77 | HR 91

## 2018-04-16 DIAGNOSIS — E1142 Type 2 diabetes mellitus with diabetic polyneuropathy: Secondary | ICD-10-CM

## 2018-04-16 DIAGNOSIS — M79675 Pain in left toe(s): Secondary | ICD-10-CM

## 2018-04-16 DIAGNOSIS — B351 Tinea unguium: Secondary | ICD-10-CM | POA: Diagnosis not present

## 2018-04-16 DIAGNOSIS — M79674 Pain in right toe(s): Secondary | ICD-10-CM

## 2018-04-16 DIAGNOSIS — M2010 Hallux valgus (acquired), unspecified foot: Secondary | ICD-10-CM | POA: Diagnosis not present

## 2018-04-16 DIAGNOSIS — E1159 Type 2 diabetes mellitus with other circulatory complications: Secondary | ICD-10-CM | POA: Diagnosis not present

## 2018-04-16 NOTE — Progress Notes (Signed)
This patient presents to the office with chief complaint of long thick nails and diabetic feet.  She says her second toenail right foot came off at home. This patient  says there  is  no pain and discomfort in her feet.  This patient says there are long thick painful nails.  These nails are painful walking and wearing shoes.  Patient has no history of infection or drainage from both feet.  Patient is unable to  self treat his own nails . This patient presents  to the office today for treatment of the  long nails and a foot evaluation due to history of  diabetes.  General Appearance  Alert, conversant and in no acute stress.  Vascular  Dorsalis pedis and posterior tibial  pulses are weakly  palpable  bilaterally.  Capillary return is within normal limits  bilaterally. Temperature is within normal limits  bilaterally.  Neurologic  Senn-Weinstein monofilament wire test  Absent   bilaterally. Muscle power within normal limits bilaterally.  Nails Thick disfigured discolored nails with subungual debris  from hallux to fifth toes bilaterally. No evidence of bacterial infection or drainage bilaterally.  Orthopedic  No limitations of motion of motion feet .  No crepitus or effusions noted.  No bony pathology or digital deformities noted. Mild  HAV  B/L.  Skin  normotropic skin with no porokeratosis noted bilaterally.  No signs of infections or ulcers noted.     Onychomycosis  Diabetes with angiopathy and neuropathy.  IE  Debride nails x 10.  A diabetic foot exam was performed and there is  evidence of any vascular or neurologic pathology.  Patient qualifies for diabetic shoes for DPN  HAV and vascular disease.    RTC 3 months. Make an appointment with Liliane Channel for diabetic shoes.  Gardiner Barefoot DPM

## 2018-04-17 DIAGNOSIS — D631 Anemia in chronic kidney disease: Secondary | ICD-10-CM | POA: Diagnosis not present

## 2018-04-17 DIAGNOSIS — I130 Hypertensive heart and chronic kidney disease with heart failure and stage 1 through stage 4 chronic kidney disease, or unspecified chronic kidney disease: Secondary | ICD-10-CM | POA: Diagnosis not present

## 2018-04-17 DIAGNOSIS — E1122 Type 2 diabetes mellitus with diabetic chronic kidney disease: Secondary | ICD-10-CM | POA: Diagnosis not present

## 2018-04-17 DIAGNOSIS — E1142 Type 2 diabetes mellitus with diabetic polyneuropathy: Secondary | ICD-10-CM | POA: Diagnosis not present

## 2018-04-17 DIAGNOSIS — I2510 Atherosclerotic heart disease of native coronary artery without angina pectoris: Secondary | ICD-10-CM | POA: Diagnosis not present

## 2018-04-17 DIAGNOSIS — F411 Generalized anxiety disorder: Secondary | ICD-10-CM | POA: Diagnosis not present

## 2018-04-17 DIAGNOSIS — M726 Necrotizing fasciitis: Secondary | ICD-10-CM | POA: Diagnosis not present

## 2018-04-17 DIAGNOSIS — I5032 Chronic diastolic (congestive) heart failure: Secondary | ICD-10-CM | POA: Diagnosis not present

## 2018-04-17 DIAGNOSIS — J449 Chronic obstructive pulmonary disease, unspecified: Secondary | ICD-10-CM | POA: Diagnosis not present

## 2018-04-17 DIAGNOSIS — G4733 Obstructive sleep apnea (adult) (pediatric): Secondary | ICD-10-CM | POA: Diagnosis not present

## 2018-04-17 DIAGNOSIS — I69354 Hemiplegia and hemiparesis following cerebral infarction affecting left non-dominant side: Secondary | ICD-10-CM | POA: Diagnosis not present

## 2018-04-17 DIAGNOSIS — J9611 Chronic respiratory failure with hypoxia: Secondary | ICD-10-CM | POA: Diagnosis not present

## 2018-04-17 DIAGNOSIS — N182 Chronic kidney disease, stage 2 (mild): Secondary | ICD-10-CM | POA: Diagnosis not present

## 2018-04-18 ENCOUNTER — Encounter: Payer: Self-pay | Admitting: Endocrinology

## 2018-04-18 ENCOUNTER — Ambulatory Visit (INDEPENDENT_AMBULATORY_CARE_PROVIDER_SITE_OTHER): Payer: BLUE CROSS/BLUE SHIELD | Admitting: Endocrinology

## 2018-04-18 VITALS — BP 128/72 | HR 86 | Ht 64.0 in | Wt 261.8 lb

## 2018-04-18 DIAGNOSIS — N183 Chronic kidney disease, stage 3 unspecified: Secondary | ICD-10-CM

## 2018-04-18 DIAGNOSIS — Z794 Long term (current) use of insulin: Secondary | ICD-10-CM

## 2018-04-18 DIAGNOSIS — E1122 Type 2 diabetes mellitus with diabetic chronic kidney disease: Secondary | ICD-10-CM | POA: Diagnosis not present

## 2018-04-18 LAB — POCT GLYCOSYLATED HEMOGLOBIN (HGB A1C): Hemoglobin A1C: 9.4 % — AB (ref 4.0–5.6)

## 2018-04-18 IMAGING — CR DG CHEST 2V
2 series · 2 of 2 positions shown · non-contrast
Comparison: 03/29/2017

CLINICAL DATA: Shortness of breath and dizziness.

EXAM:
CHEST  2 VIEW

[chest lat]
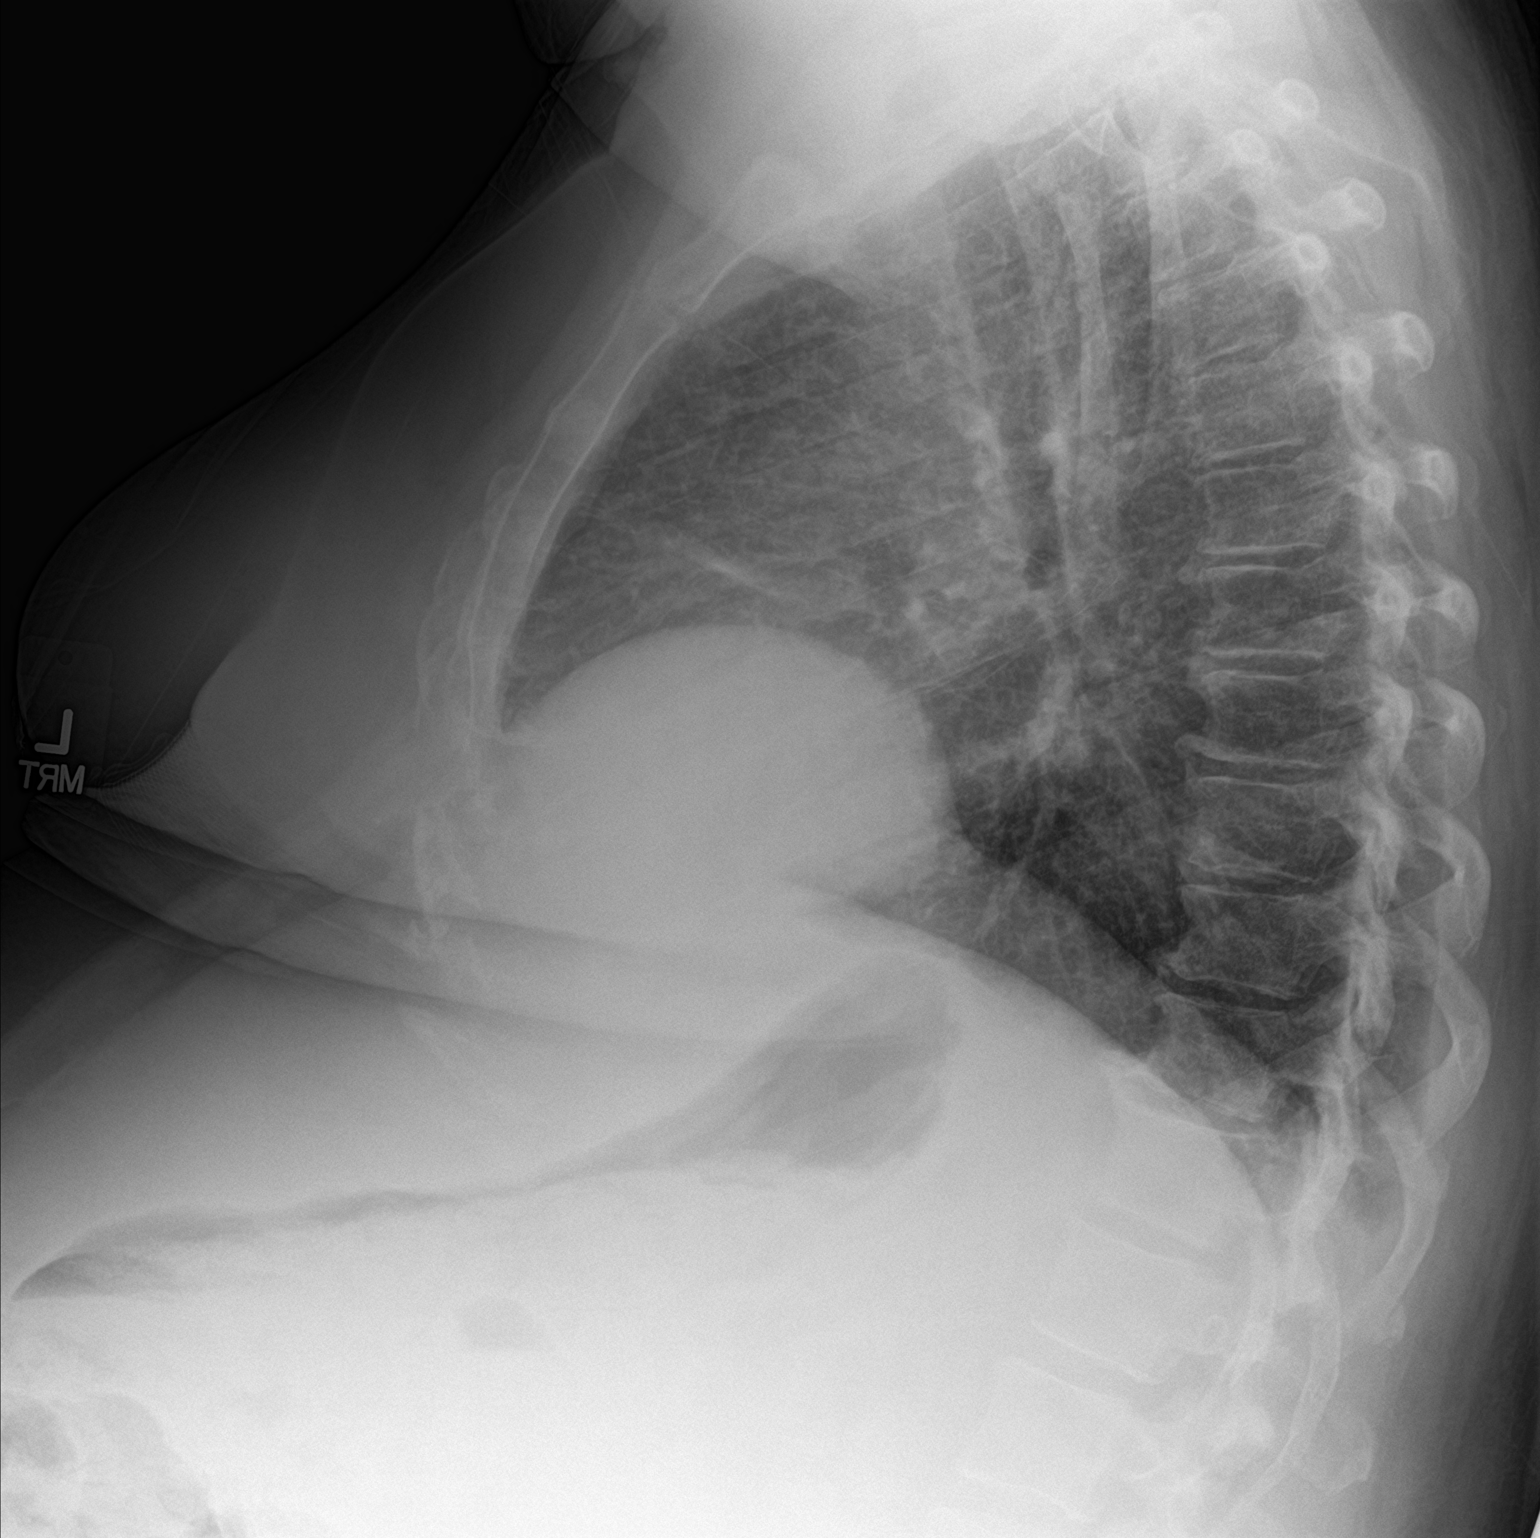

[chest ap]
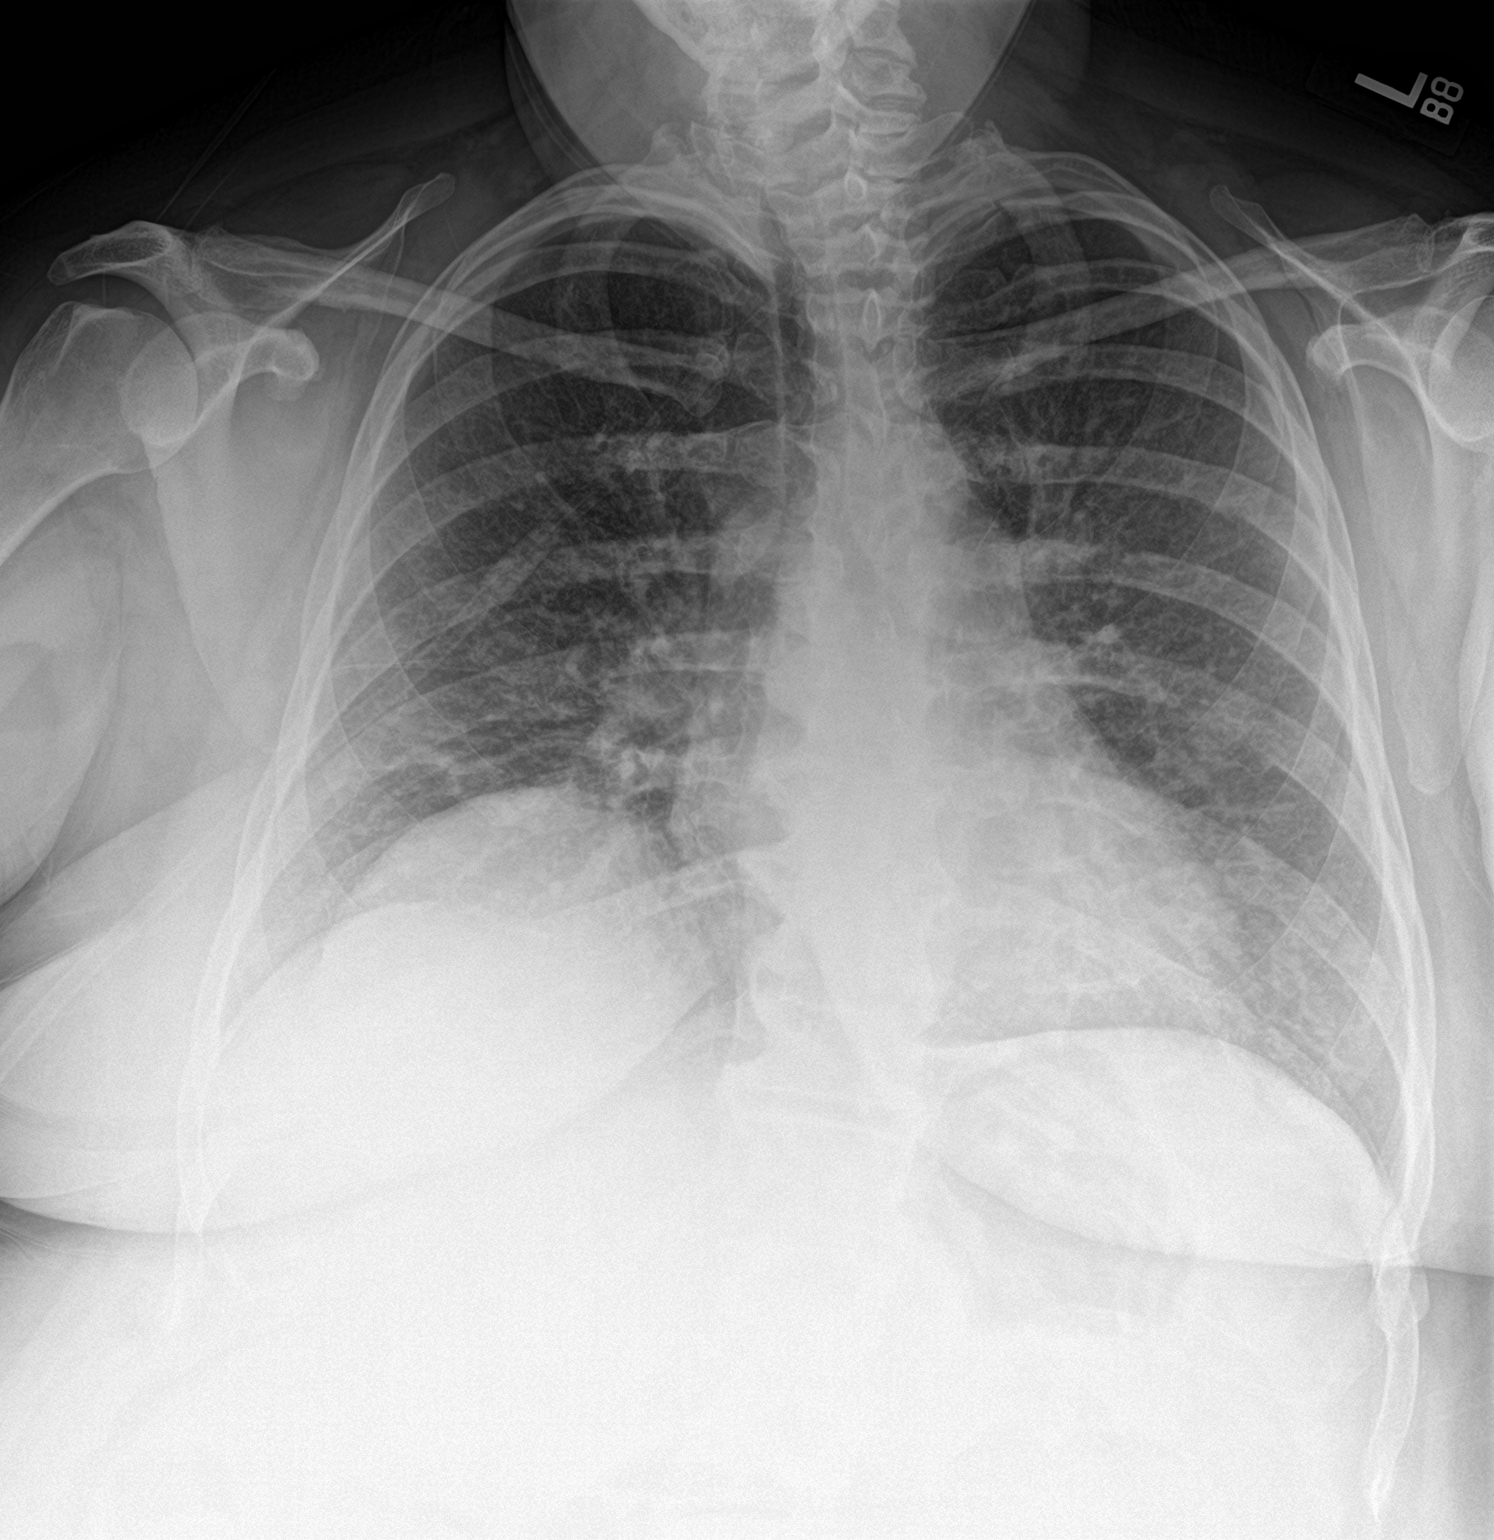

[2 of 2 positions shown; findings below may reference images not displayed]

FINDINGS: Mildly enlarged cardiac silhouette. Mediastinal contours appear
intact.

There is no evidence of focal airspace consolidation, pleural
effusion or pneumothorax. Chronic elevation of the right
hemidiaphragm. Mild interstitial prominence.

Osseous structures are without acute abnormality. Soft tissues are
grossly normal.
IMPRESSION: Mild interstitial prominence which may represent pulmonary vascular
congestion.

Mildly enlarged cardiac silhouette.

## 2018-04-18 MED ORDER — INSULIN REGULAR HUMAN (CONC) 500 UNIT/ML ~~LOC~~ SOPN: 200.0000 [IU] | PEN_INJECTOR | Freq: Two times a day (BID) | SUBCUTANEOUS | 11 refills | Status: DC

## 2018-04-18 NOTE — Progress Notes (Signed)
Subjective:    Patient ID: [name], female    DOB: October 24, 1970, 47 y.o.   MRN: [id number]  HPI Pt returns for f/u of diabetes mellitus: DM type: Insulin-requiring type 2 Dx'ed: 1988.  Complications: polyneuropathy, TIA, renal insuff, and DR Therapy: insulin since soon after dx (also takes victoza, metformin, and farxiga) GDM: never DKA: never Severe hypoglycemia: last episode was mid-2018 Pancreatitis: never Pancreatic imaging: normal on 2011 CT Other: she takes multiple daily injections; she takes U-500, due to severe insulin resistance.  She does not eat lunch, so she does not take insulin then Interval history:  Since last ov, she has been in the hospital with fascitis.  Ozempic was stopped.  She takes a widely varying dosage of reg insulin.  She says she averages approx a total of 250 units per day.  pt states she feels well in general.   Past Medical History:  Diagnosis Date  . Anxiety    takes Prozac daily  . Anxiety   . Aortic valve calcification   . Asthma    Advair and Spirva daily  . Asthma   . Bipolar disorder ([place])   . CAD (coronary artery disease)    a. LHC 11/2013 done for CP/fluid retention: mild disease in prox LAD, mild-mod disease in mRCA, EF 60% with normal LVEDP. b. Normal nuc 03/2016.  Marland Kitchen CHF (congestive heart failure) ([place])   . Chronic diastolic CHF (congestive heart failure) ([place])   . CKD (chronic kidney disease), stage II   . COPD (chronic obstructive pulmonary disease) ([facility])    a. nocturnal O2.  Marland Kitchen COPD (chronic obstructive pulmonary disease) ([place])   . Coronary artery disease   . Diabetes mellitus   . Diabetes mellitus without complication ([place])   . Dyspnea   . Family history of adverse reaction to anesthesia    mom gets nauseated  . GERD (gastroesophageal reflux disease)    takes Pepcid daily  . History of blood clots    left leg 3-59yrs ago  . Hyperlipidemia   . Hypertension   . Hypertriglyceridemia   . Inguinal hernia, left 01/2015  .  Muscle spasm   . Peripheral neuropathy   . RBBB   . Seizures ([place])   . Sinus tachycardia    a. persistent since 2009.  Marland Kitchen Smokers' cough ([place])   . Stroke [facility]) 1989   left sided weakness  . TIA (transient ischemic attack)   . Tobacco abuse     Past Surgical History:  Procedure Laterality Date  . HERNIA REPAIR    . INCISION AND DRAINAGE ABSCESS N/A 01/26/2018   Procedure: INCISION AND DEBRIDEMENT OF VULVAR NECROTIZING SOFT TISSUE INFECTION;  Surgeon: Greer Pickerel, MD;  Location: [facility];  Service: General;  Laterality: N/A;  . INCISION AND DRAINAGE PERIRECTAL ABSCESS N/A 01/22/2018   Procedure: IRRIGATION AND DEBRIDEMENT LABIAL/VULVAR AREA;  Surgeon: Coralie Keens, MD;  Location: [place];  Service: General;  Laterality: N/A;  . INCISION AND DRAINAGE PERIRECTAL ABSCESS N/A 01/29/2018   Procedure: IRRIGATION AND DEBRIDEMENT VULVA;  Surgeon: Excell Seltzer, MD;  Location: [place];  Service: General;  Laterality: N/A;  . INGUINAL HERNIA REPAIR Left 04/08/2015   Procedure: OPEN LEFT INGUINAL HERNIA REPAIR WITH MESH;  Surgeon: Ralene Ok, MD;  Location: [place];  Service: General;  Laterality: Left;  . INSERTION OF MESH Left 04/08/2015   Procedure: INSERTION OF MESH;  Surgeon: Ralene Ok, MD;  Location: [facility];  Service: General;  Laterality: Left;  . LAPAROSCOPY  Endometriosis  . LEFT HEART CATHETERIZATION WITH CORONARY ANGIOGRAM N/A 12/05/2013   Procedure: LEFT HEART CATHETERIZATION WITH CORONARY ANGIOGRAM;  Surgeon: Jettie Booze, MD;  Location: [facility] CATH LAB;  Service: Cardiovascular;  Laterality: N/A;  . right kidney drained    . TEE WITHOUT CARDIOVERSION N/A 11/28/2013   Procedure: TRANSESOPHAGEAL ECHOCARDIOGRAM (TEE);  Surgeon: Thayer Headings, MD;  Location: [facility] ENDOSCOPY;  Service: Cardiovascular;  Laterality: N/A;    Social History   Socioeconomic History  . Marital status: Married    Spouse name: Not on file  . Number of children: Not on file  . Years of education:  Not on file  . Highest education level: Not on file  Occupational History  . Not on file  Social Needs  . Financial resource strain: Not on file  . Food insecurity:    Worry: Not on file    Inability: Not on file  . Transportation needs:    Medical: Not on file    Non-medical: Not on file  Tobacco Use  . Smoking status: Former Smoker    Packs/day: 1.00    Years: 36.00    Pack years: 36.00    Types: Cigarettes    Last attempt to quit: 01/18/2018    Years since quitting: 0.2  . Smokeless tobacco: Never Used  Substance and Sexual Activity  . Alcohol use: No    Frequency: Never  . Drug use: No  . Sexual activity: Not Currently  Lifestyle  . Physical activity:    Days per week: Not on file    Minutes per session: Not on file  . Stress: Not on file  Relationships  . Social connections:    Talks on phone: Not on file    Gets together: Not on file    Attends religious service: Not on file    Active member of club or organization: Not on file    Attends meetings of clubs or organizations: Not on file    Relationship status: Not on file  . Intimate partner violence:    Fear of current or ex partner: Not on file    Emotionally abused: Not on file    Physically abused: Not on file    Forced sexual activity: Not on file  Other Topics Concern  . Not on file  Social History Narrative   ** Merged History Encounter **       Lives in [place]   Married but lives with Boyfriend - not legally separated   Disabled - for BiPolar, Seizure disorder, diabetes   Formerly worked at [employer]          Current Outpatient Medications on File Prior to Visit  Medication Sig Dispense Refill  . acetaminophen (TYLENOL) 500 MG tablet Take 500 mg by mouth every 4 (four) hours as needed.    . ALPRAZolam (XANAX) 1 MG tablet Take 1 mg by mouth 3 (three) times daily as needed for anxiety.    Marland Kitchen aspirin EC 81 MG tablet Take 81 mg by mouth every morning.    . budesonide (PULMICORT) 0.5 MG/2ML nebulizer  solution Take 0.5 mg by nebulization 2 (two) times daily.    . busPIRone (BUSPAR) 30 MG tablet Take 30 mg by mouth 2 (two) times daily.    . famotidine (PEPCID) 20 MG tablet Take 20 mg by mouth 2 (two) times daily.    . fentaNYL (DURAGESIC - DOSED MCG/HR) 25 MCG/HR patch Place 25 mcg onto the skin every 3 (three) days.    Marland Kitchen  glucose blood (ONE TOUCH ULTRA TEST) test strip Used to check blood sugars 4x daily. 200 each 4  . Insulin Pen Needle 32G X 4 MM MISC Used to give insulin injections twice daily. 100 each 11  . ipratropium-albuterol (DUONEB) 0.5-2.5 (3) MG/3ML SOLN Take 3 mLs by nebulization every 4 (four) hours as needed (wheezing/shortness of breath). 360 mL 0  . loperamide (IMODIUM A-D) 2 MG tablet Take 4 mg by mouth 3 (three) times daily as needed for diarrhea or loose stools.    Marland Kitchen LYRICA 300 MG capsule TAKE 1 CAPSULE BY MOUTH TWICE DAILY FOR NEUROPATHY 180 capsule 0  . metFORMIN (GLUCOPHAGE) 1000 MG tablet Take 1,000 mg by mouth 2 (two) times daily with a meal.    . metolazone (ZAROXOLYN) 2.5 MG tablet Take 2.5 mg by mouth every other day. Take 2.5 mg by mouth every 48 hours on Tuesday, Thursday and Saturday    . oxyCODONE (OXY IR/ROXICODONE) 5 MG immediate release tablet Take 1 tablet (5 mg total) by mouth every 4 (four) hours as needed for severe pain. 5 tablet 0  . OXYGEN Inhale 4 L into the lungs at bedtime.     Marland Kitchen PRESCRIPTION MEDICATION Place 1 drop into both eyes daily as needed (every 14 weeks before injections).    . ranitidine (ZANTAC) 150 MG tablet Take 150 mg by mouth 2 (two) times daily.     Marland Kitchen rOPINIRole (REQUIP) 0.5 MG tablet Take 1 tablet by mouth at bedtime for restless legs. 90 tablet 0  . sodium hypochlorite (DAKIN'S 1/4 STRENGTH) 0.125 % SOLN Irrigate with as directed 2 (two) times daily. 473 mL 0  . spironolactone (ALDACTONE) 50 MG tablet Take 50 mg by mouth daily.    Marland Kitchen torsemide (DEMADEX) 10 MG tablet Take 3 tablets by mouth once daily for swelling and fluid retention.  (Patient taking differently: Take 20-40 mg by mouth 2 (two) times daily. 40 mg in the morning and 20 mg in the evening) 90 tablet 0  . traZODone (DESYREL) 50 MG tablet Take 50-150 mg by mouth at bedtime.    Marland Kitchen linaclotide (LINZESS) 290 MCG CAPS capsule Take 1 capsule (290 mcg total) by mouth daily before breakfast. 90 capsule 0   No current facility-administered medications on file prior to visit.     Allergies  Allergen Reactions  . Adhesive [Tape] Rash and Other (See Comments)    TAKES OFF THE SKIN (CERTAIN MEDICAL TAPES DO THIS!!)  . Metoprolol Shortness Of Breath    Occurrence of shortness of breath after 3 days  . Montelukast Shortness Of Breath  . Morphine Sulfate Anaphylaxis, Shortness Of Breath and Nausea And Vomiting    Swollen Throat - Able to tolerate dilaudid  . Penicillins Anaphylaxis, Hives and Shortness Of Breath    Throat swells Has patient had a PCN reaction causing immediate rash, facial/tongue/throat swelling, SOB or lightheadedness with hypotension: Yes Has patient had a PCN reaction causing severe rash involving mucus membranes or skin necrosis: No Has patient had a PCN reaction that required hospitalization: Yes Has patient had a PCN reaction occurring within the last 10 years: No If all of the above answers are "NO", then may proceed with Cephalosporin use.   . Prednisone Anaphylaxis  . Diltiazem Swelling  . Gabapentin Swelling    Family History  Problem Relation Age of Onset  . Venous thrombosis Brother   . Other Brother        BRAIN TUMOR  . Asthma Father   .  Diabetes Father   . Coronary artery disease Mother   . Hypertension Mother   . Diabetes Mother   . Asthma Sister   . Diabetes type II Brother     BP 128/72 (BP Location: Left Arm)   Pulse 86   Ht 5\' 4"  (1.626 m)   Wt 261 lb 12.8 oz (118.8 kg)   LMP 11/11/2011   SpO2 93%   BMI 44.94 kg/m     Review of Systems She has lost 19 lbs since last ov.      Objective:   Physical Exam VITAL  SIGNS:  See vs page GENERAL: no distress Pulses: dorsalis pedis intact bilat.   MSK: no deformity of the feet CV: 1+ bilat leg edema  Skin:  no ulcer on the feet, but the skin is very dry.  normal color and temp on the feet. Neuro: sensation is intact to touch on the feet, but severely decreased from normal Ext: There is bilateral onychomycosis of the toenails.   Lab Results  Component Value Date   HGBA1C 9.4 (A) 04/18/2018        Assessment & Plan:  Insulin-requiring type 2 DM, with renal insuff: she needs increased rx Fasciitis: this has resulted in a decreased insulin requirement, but it will prob return to pre-illness levels Weight loss: pt requests to resume the Ozempic when feasable  Patient Instructions  check your blood sugar twice a day.  vary the time of day when you check, between before the 3 meals, and at bedtime.  also check if you have symptoms of your blood sugar being too high or too low.  please keep a record of the readings and bring it to your next appointment here (or you can bring the meter itself).  You can write it on any piece of paper.  please call us sooner if your blood sugar goes below 70, or if you have a lot of readings over 200. For now, please:  Increase the insulin to 200 units with breakfast and 200 units with supper, no matter what you blood sugar is. Please call or message [place] next week, to tell us how the blood sugar is doing.  We can increase the ozempic if necessary.  Please come back for a follow-up appointment in 1 month. We can add the Ozempic if we need to.

## 2018-04-18 NOTE — Patient Instructions (Addendum)
check your blood sugar twice a day.  vary the time of day when you check, between before the 3 meals, and at bedtime.  also check if you have symptoms of your blood sugar being too high or too low.  please keep a record of the readings and bring it to your next appointment here (or you can bring the meter itself).  You can write it on any piece of paper.  please call us sooner if your blood sugar goes below 70, or if you have a lot of readings over 200. For now, please:  Increase the insulin to 200 units with breakfast and 200 units with supper, no matter what you blood sugar is. Please call or message [place] next week, to tell us how the blood sugar is doing.  We can increase the ozempic if necessary.  Please come back for a follow-up appointment in 1 month. We can add the Ozempic if we need to.

## 2018-04-19 ENCOUNTER — Other Ambulatory Visit: Payer: Self-pay | Admitting: Primary Care

## 2018-04-19 DIAGNOSIS — J449 Chronic obstructive pulmonary disease, unspecified: Secondary | ICD-10-CM

## 2018-04-19 NOTE — Progress Notes (Signed)
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 into the lungs at bedtime. , Disp: , Rfl:  .  pregabalin (LYRICA) 300 MG capsule, Take 1 capsule (300 mg total) by mouth 3 (three) times daily., Disp: 90 capsule, Rfl: 0 .  PRESCRIPTION MEDICATION, Place 1 drop into both eyes daily as needed (every 14 weeks before injections)., Disp: , Rfl:  .  ranitidine (ZANTAC) 150 MG tablet, Take 150 mg by mouth 2 (two) times daily. , Disp: , Rfl:  .  rOPINIRole (REQUIP) 0.5 MG tablet, Take 1 tablet by mouth at bedtime for restless legs., Disp: 90 tablet, Rfl: 0 .  spironolactone (ALDACTONE) 50 MG tablet, Take 50 mg by mouth daily., Disp: , Rfl:  .  torsemide (DEMADEX) 10 MG tablet, Take 3 tablets by mouth once daily for swelling and fluid retention. (Patient taking differently: Take 20-40 mg by mouth 2 (two) times daily. 40 mg in the morning and 20 mg in the evening), Disp: 90 tablet, Rfl: 0 .   traZODone (DESYREL) 50 MG tablet, Take 50-150 mg by mouth at bedtime., Disp: , Rfl:  .  linaclotide (LINZESS) 290 MCG CAPS capsule, Take 1 capsule (290 mcg total) by mouth daily before breakfast., Disp: 90 capsule, Rfl: 0  ROS  Constitutional: Denies any fever or chills Gastrointestinal: No reported hemesis, hematochezia, vomiting, or acute GI distress Musculoskeletal: Denies any acute onset joint swelling, redness, loss of ROM, or weakness Neurological: No reported episodes of acute onset apraxia, aphasia, dysarthria, agnosia, amnesia, paralysis, loss of coordination, or loss of consciousness  Allergies  Ms. Whisenant is allergic to adhesive [tape]; metoprolol; montelukast; morphine sulfate; penicillins; prednisone; diltiazem; and gabapentin.  PFSH  Drug: Ms. Dobkins  reports that she does not use drugs. Alcohol:  reports that she does not drink alcohol. Tobacco:  reports that she has been smoking cigarettes. She has a 36.00 pack-year smoking history. She has never used smokeless tobacco. Medical:  has a past medical history of Anxiety, Anxiety, Aortic valve calcification, Asthma, Asthma, Bipolar disorder ([place]), CAD (coronary artery disease), CHF (congestive heart failure) ([place]), Chronic diastolic CHF (congestive heart failure) ([place]), CKD (chronic kidney disease), stage II, COPD (chronic obstructive pulmonary disease) ([place]), COPD (chronic obstructive pulmonary disease) ([place]), Coronary artery disease, Diabetes mellitus, Diabetes mellitus without complication ([place]), Dyspnea, Family history of adverse reaction to anesthesia, GERD (gastroesophageal reflux disease), History of blood clots, Hyperlipidemia, Hypertension, Hypertriglyceridemia, Inguinal hernia, left (01/2015), Muscle spasm, Peripheral neuropathy, RBBB, Seizures ([place]), Sinus tachycardia, Smokers' cough ([place]), Stroke ([place]) (1989), TIA (transient ischemic attack), and Tobacco abuse. Surgical: Ms. Ribera  has a past surgical history that includes  laparoscopy; TEE without cardioversion (N/A, 11/28/2013); left heart catheterization with coronary angiogram (N/A, 12/05/2013); right kidney drained; Inguinal hernia repair (Left, 04/08/2015); Insertion of mesh (Left, 04/08/2015); Hernia repair; Incision and drainage perirectal abscess (N/A, 01/22/2018); Incision and drainage abscess (N/A, 01/26/2018); and Incision and drainage perirectal abscess (N/A, 01/29/2018). Family: family history includes Asthma in her father and sister; Coronary artery disease in her mother; Diabetes in her father and mother; Diabetes type II in her brother; Hypertension in her mother; Other in her brother; Venous thrombosis in her brother.  Constitutional Exam  General appearance: Well nourished, well developed, and well hydrated. In no apparent acute distress Vitals:   04/23/18 1115  BP: 136/65  Pulse: 95  Resp: 18  Temp: 97.7 F (36.5 C)  TempSrc: Oral  SpO2: 98%  Weight: 250 lb (113.4 kg)  Height: 5' 4"  (1.626 m)   BMI Assessment: Estimated body mass index is 42.91 kg/m as calculated from the following:   Height as of this encounter: 5' 4"  (1.626 m).   Weight as of this encounter: 250 lb (113.4 kg).  BMI interpretation table: BMI level Category Range association with higher incidence of chronic pain  <18 kg/m2 Underweight   18.5-24.9 kg/m2 Ideal body weight   25-29.9 kg/m2 Overweight Increased incidence by 20%  30-34.9 kg/m2 Obese (Class I) Increased incidence by 68%  35-39.9 kg/m2 Severe obesity (Class II) Increased incidence by 136%  >40 kg/m2 Extreme obesity (Class III) Increased incidence by 254%   Patient's current BMI Ideal Body weight  Body mass index is 42.91 kg/m. Ideal body weight: 54.7 kg (120 lb 9.5 oz) Adjusted ideal body weight: 78.2 kg (172 lb 5.7 oz)   BMI Readings from Last 4 Encounters:  04/23/18 42.91 kg/m  04/18/18 44.94 kg/m  04/05/18 42.87 kg/m  02/05/18 56.27 kg/m   Wt Readings from Last 4 Encounters:  04/23/18 250 lb (113.4  kg)  04/18/18 261 lb 12.8 oz (118.8 kg)  04/05/18 249 lb 12 oz (113.3 kg)  02/05/18 (!) 327 lb 13.2 oz (148.7 kg)  Psych/Mental status: Alert, oriented x 3 (person, place, & time)       Eyes: PERLA Respiratory: No evidence of acute respiratory distress  Cervical Spine Area Exam  Skin & Axial Inspection: No masses, redness, edema, swelling, or associated skin lesions Alignment: Symmetrical Functional ROM: Unrestricted ROM      Stability: No instability detected Muscle Tone/Strength: Functionally intact. No obvious neuro-muscular anomalies detected. Sensory (Neurological): Unimpaired Palpation: No palpable anomalies              Upper Extremity (UE) Exam    Side: Right upper extremity  Side: Left upper extremity  Skin & Extremity Inspection: Skin color, temperature, and hair growth are WNL. No peripheral edema or cyanosis. No masses, redness, swelling, asymmetry, or associated skin lesions. No contractures.  Skin & Extremity Inspection: Skin color, temperature, and hair growth are WNL. No peripheral edema or cyanosis. No masses, redness, swelling, asymmetry, or associated skin lesions. No contractures.  Functional ROM: Unrestricted ROM          Functional ROM: Unrestricted ROM          Muscle Tone/Strength: Functionally intact. No obvious neuro-muscular anomalies detected.  Muscle Tone/Strength: Functionally intact. No obvious neuro-muscular anomalies detected.  Sensory (Neurological): Unimpaired          Sensory (Neurological): Unimpaired          Palpation: No palpable anomalies  Palpation: No palpable anomalies              Provocative Test(s):  Phalen's test: deferred Tinel's test: deferred Apley's scratch test (touch opposite shoulder):  Action 1 (Across chest): deferred Action 2 (Overhead): deferred Action 3 (LB reach): deferred   Provocative Test(s):  Phalen's test: deferred Tinel's test: deferred Apley's scratch test (touch opposite shoulder):  Action 1 (Across  chest): deferred Action 2 (Overhead): deferred Action 3 (LB reach): deferred    Thoracic Spine Area Exam  Skin & Axial Inspection: No masses, redness, or swelling Alignment: Symmetrical Functional ROM: Unrestricted ROM Stability: No instability detected Muscle Tone/Strength: Functionally intact. No obvious neuro-muscular anomalies detected. Sensory (Neurological): Unimpaired Muscle strength & Tone: No palpable anomalies  Lumbar Spine Area Exam  Skin & Axial Inspection: No masses, redness, or swelling Alignment: Symmetrical Functional ROM: Decreased ROM       Stability: No instability detected Muscle Tone/Strength: Functionally intact. No obvious neuro-muscular anomalies detected. Sensory (Neurological): Unimpaired Palpation: Complains of area being tender to palpation       Provocative Tests: Hyperextension/rotation test: (+) on the left for facet joint pain. Lumbar quadrant test (Kemp's test): (+) on the left for facet joint pain. Lateral bending test: deferred today       Patrick's Maneuver: deferred today                   FABER test: deferred today                   S-I anterior distraction/compression test: deferred today         S-I lateral compression test: deferred today         S-I Thigh-thrust test: deferred today         S-I Gaenslen's test: deferred today          Gait & Posture Assessment  Ambulation: Patient came in today in a wheel chair Gait: Relatively normal for age and body habitus Posture: WNL   Lower Extremity Exam    Side: Right lower extremity  Side: Left lower extremity  Stability: No instability observed          Stability: No instability observed          Skin & Extremity Inspection: Skin color, temperature, and hair growth are WNL. No peripheral edema or cyanosis. No masses, redness, swelling, asymmetry, or associated skin lesions. No contractures.  Skin & Extremity Inspection: Skin color, temperature, and hair growth are WNL. No peripheral edema or  cyanosis. No masses, redness, swelling, asymmetry, or associated skin lesions. No contractures.  Functional ROM: Unrestricted ROM                  Functional ROM: Unrestricted ROM                  Muscle Tone/Strength: Deconditioned  Muscle Tone/Strength: Deconditioned  Sensory (Neurological): Unimpaired  Sensory (Neurological): Unimpaired  Palpation: No palpable anomalies  Palpation: No palpable anomalies   Assessment & Plan  Primary Diagnosis & Pertinent Problem List: The primary encounter diagnosis was Chronic pain syndrome. Diagnoses of Chronic lower extremity pain (Primary Area of Pain) (Bilateral), Chronic upper extremity pain (Secondary Area of Pain) (Bilateral), Chronic low back pain (Tertiary Area of Pain) (Bilateral) w/ sciatica (Bilateral), Diabetic polyneuropathy associated with type 1 diabetes mellitus (HCC), Neurogenic pain, Neuropathy, Pharmacologic therapy, Long term prescription benzodiazepine use, Long term current use of opiate analgesic, Elevated C-reactive protein (CRP), and Elevated sedimentation rate were also  pertinent to this visit.  Visit Diagnosis: 1. Chronic pain syndrome   2. Chronic lower extremity pain (Primary Area of Pain) (Bilateral)   3. Chronic upper extremity pain (Secondary Area of Pain) (Bilateral)   4. Chronic low back pain [facility] Area of Pain) (Bilateral) w/ sciatica (Bilateral)   5. Diabetic polyneuropathy associated with type 1 diabetes mellitus ([place])   6. Neurogenic pain   7. Neuropathy   8. Pharmacologic therapy   9. Long term prescription benzodiazepine use   10. Long term current use of opiate analgesic   11. Elevated C-reactive protein (CRP)   12. Elevated sedimentation rate    Problems updated and reviewed during this visit: Problem  Chronic upper extremity pain (Secondary Area of Pain) (Bilateral)  Diabetic Polyneuropathy Associated With Type 1 Diabetes Mellitus (Hcc)  Neurogenic Pain  Chronic lower extremity pain (Primary Area of  Pain) (Bilateral)  Chronic low back pain (Tertiary Area of Pain) (Bilateral) w/ sciatica (Bilateral)  Long Term Prescription Benzodiazepine Use    Plan of Care  Pharmacotherapy (Medications Ordered): Meds ordered this encounter  Medications  . pregabalin (LYRICA) 300 MG capsule    Sig: Take 1 capsule (300 mg total) by mouth 3 (three) times daily.    Dispense:  90 capsule    Refill:  0    Do not place medication on "Automatic Refill". Fill one day early if pharmacy is closed on scheduled refill date.    Procedure Orders     LUMBAR SYMPATHETIC BLOCK  Lab Orders     ToxASSURE Select 13 (MW), Urine     Rheumatoid factor     ANA w/Reflex if Positive Imaging Orders  No imaging studies ordered today   Referral Orders  No referral(s) requested today    Pharmacological management options:  Opioid Analgesics: I will not be prescribing any opioids at this time Membrane stabilizer: We have discussed the possibility of optimizing this mode of therapy, if tolerated Muscle relaxant: We have discussed the possibility of a trial NSAID: We have discussed the possibility of a trial Other analgesic(s): To be determined at a later time   Interventional management options: Planned, scheduled, and/or pending:    Diagnostic bilateral sympathetic nerve blocks #1 Under fluoroscopic guidance and IV sedation    Considering:   Diagnostic bilateral sympathetic nerve blocks  Possible  bilateral sympathetic nerve RFA  Diagnostic bilateral lumbar epidural steroid injection  Diagnostic bilateral lumbar facet nerve block  Possible bilateral lumbar facet RFA    PRN Procedures:   None at this time   Provider-requested follow-up: Return for Procedure (w/ sedation): (B) LSB #1 (NO STEROIDS).  Future Appointments  Date Time Provider [place]  05/08/2018  9:15 AM Milinda Pointer, MD [facility] None  05/22/2018  9:00 AM Renato Shin, MD [facility] None  05/23/2018  9:00 AM Velora Heckler  [facility]  06/15/2018  8:20 AM Pleas Koch, NP [facility]  07/19/2018  9:15 AM Gardiner Barefoot, DPM [facility]    Primary Care Physician: Pleas Koch, NP Location: [facility] Note by: Gaspar Cola, MD Date: 04/23/2018; Time: 1:53 PM

## 2018-04-20 ENCOUNTER — Telehealth: Payer: Self-pay | Admitting: Primary Care

## 2018-04-20 NOTE — Telephone Encounter (Signed)
Copied from [place] [phone]. Topic: Quick Communication - See Telephone Encounter >> Apr 20, 2018  5:13 PM Blase Mess A wrote: CRM for notification. See Telephone encounter for: 04/20/18. [place] Patient missed her appt. She said that she was spending time with her husband please advise [phone]

## 2018-04-23 ENCOUNTER — Other Ambulatory Visit: Payer: Self-pay

## 2018-04-23 ENCOUNTER — Ambulatory Visit: Payer: BLUE CROSS/BLUE SHIELD | Attending: Pain Medicine | Admitting: Pain Medicine

## 2018-04-23 ENCOUNTER — Encounter: Payer: Self-pay | Admitting: Pain Medicine

## 2018-04-23 VITALS — BP 136/65 | HR 95 | Temp 97.7°F | Resp 18 | Ht 64.0 in | Wt 250.0 lb

## 2018-04-23 DIAGNOSIS — G8929 Other chronic pain: Secondary | ICD-10-CM | POA: Insufficient documentation

## 2018-04-23 DIAGNOSIS — M79601 Pain in right arm: Secondary | ICD-10-CM | POA: Diagnosis not present

## 2018-04-23 DIAGNOSIS — M79604 Pain in right leg: Secondary | ICD-10-CM

## 2018-04-23 DIAGNOSIS — M79602 Pain in left arm: Secondary | ICD-10-CM

## 2018-04-23 DIAGNOSIS — F17210 Nicotine dependence, cigarettes, uncomplicated: Secondary | ICD-10-CM | POA: Insufficient documentation

## 2018-04-23 DIAGNOSIS — E119 Type 2 diabetes mellitus without complications: Secondary | ICD-10-CM | POA: Insufficient documentation

## 2018-04-23 DIAGNOSIS — I5032 Chronic diastolic (congestive) heart failure: Secondary | ICD-10-CM | POA: Diagnosis not present

## 2018-04-23 DIAGNOSIS — E1142 Type 2 diabetes mellitus with diabetic polyneuropathy: Secondary | ICD-10-CM | POA: Insufficient documentation

## 2018-04-23 DIAGNOSIS — M79605 Pain in left leg: Secondary | ICD-10-CM

## 2018-04-23 DIAGNOSIS — R7982 Elevated C-reactive protein (CRP): Secondary | ICD-10-CM | POA: Diagnosis not present

## 2018-04-23 DIAGNOSIS — I2510 Atherosclerotic heart disease of native coronary artery without angina pectoris: Secondary | ICD-10-CM | POA: Diagnosis not present

## 2018-04-23 DIAGNOSIS — G629 Polyneuropathy, unspecified: Secondary | ICD-10-CM

## 2018-04-23 DIAGNOSIS — Z5181 Encounter for therapeutic drug level monitoring: Secondary | ICD-10-CM | POA: Insufficient documentation

## 2018-04-23 DIAGNOSIS — M79671 Pain in right foot: Secondary | ICD-10-CM | POA: Insufficient documentation

## 2018-04-23 DIAGNOSIS — M792 Neuralgia and neuritis, unspecified: Secondary | ICD-10-CM | POA: Insufficient documentation

## 2018-04-23 DIAGNOSIS — I130 Hypertensive heart and chronic kidney disease with heart failure and stage 1 through stage 4 chronic kidney disease, or unspecified chronic kidney disease: Secondary | ICD-10-CM | POA: Diagnosis not present

## 2018-04-23 DIAGNOSIS — G894 Chronic pain syndrome: Secondary | ICD-10-CM | POA: Diagnosis not present

## 2018-04-23 DIAGNOSIS — K219 Gastro-esophageal reflux disease without esophagitis: Secondary | ICD-10-CM | POA: Insufficient documentation

## 2018-04-23 DIAGNOSIS — Z833 Family history of diabetes mellitus: Secondary | ICD-10-CM | POA: Insufficient documentation

## 2018-04-23 DIAGNOSIS — F419 Anxiety disorder, unspecified: Secondary | ICD-10-CM | POA: Diagnosis not present

## 2018-04-23 DIAGNOSIS — E1022 Type 1 diabetes mellitus with diabetic chronic kidney disease: Secondary | ICD-10-CM | POA: Insufficient documentation

## 2018-04-23 DIAGNOSIS — N183 Chronic kidney disease, stage 3 (moderate): Secondary | ICD-10-CM | POA: Diagnosis not present

## 2018-04-23 DIAGNOSIS — Z7951 Long term (current) use of inhaled steroids: Secondary | ICD-10-CM | POA: Insufficient documentation

## 2018-04-23 DIAGNOSIS — E1042 Type 1 diabetes mellitus with diabetic polyneuropathy: Secondary | ICD-10-CM | POA: Diagnosis not present

## 2018-04-23 DIAGNOSIS — Z79899 Other long term (current) drug therapy: Secondary | ICD-10-CM

## 2018-04-23 DIAGNOSIS — R700 Elevated erythrocyte sedimentation rate: Secondary | ICD-10-CM

## 2018-04-23 DIAGNOSIS — Z79891 Long term (current) use of opiate analgesic: Secondary | ICD-10-CM | POA: Diagnosis not present

## 2018-04-23 DIAGNOSIS — J410 Simple chronic bronchitis: Secondary | ICD-10-CM | POA: Insufficient documentation

## 2018-04-23 DIAGNOSIS — M79672 Pain in left foot: Secondary | ICD-10-CM | POA: Diagnosis not present

## 2018-04-23 DIAGNOSIS — M5442 Lumbago with sciatica, left side: Secondary | ICD-10-CM

## 2018-04-23 DIAGNOSIS — Z7982 Long term (current) use of aspirin: Secondary | ICD-10-CM | POA: Insufficient documentation

## 2018-04-23 DIAGNOSIS — E785 Hyperlipidemia, unspecified: Secondary | ICD-10-CM | POA: Diagnosis not present

## 2018-04-23 DIAGNOSIS — Z8673 Personal history of transient ischemic attack (TIA), and cerebral infarction without residual deficits: Secondary | ICD-10-CM | POA: Insufficient documentation

## 2018-04-23 DIAGNOSIS — E1140 Type 2 diabetes mellitus with diabetic neuropathy, unspecified: Secondary | ICD-10-CM | POA: Insufficient documentation

## 2018-04-23 DIAGNOSIS — M5441 Lumbago with sciatica, right side: Secondary | ICD-10-CM | POA: Diagnosis not present

## 2018-04-23 DIAGNOSIS — F319 Bipolar disorder, unspecified: Secondary | ICD-10-CM | POA: Diagnosis not present

## 2018-04-23 DIAGNOSIS — G40909 Epilepsy, unspecified, not intractable, without status epilepticus: Secondary | ICD-10-CM | POA: Insufficient documentation

## 2018-04-23 DIAGNOSIS — Z794 Long term (current) use of insulin: Secondary | ICD-10-CM | POA: Insufficient documentation

## 2018-04-23 DIAGNOSIS — Z8249 Family history of ischemic heart disease and other diseases of the circulatory system: Secondary | ICD-10-CM | POA: Insufficient documentation

## 2018-04-23 DIAGNOSIS — Z6841 Body Mass Index (BMI) 40.0 and over, adult: Secondary | ICD-10-CM | POA: Insufficient documentation

## 2018-04-23 MED ORDER — PREGABALIN 300 MG PO CAPS: 300.0000 mg | ORAL_CAPSULE | Freq: Three times a day (TID) | ORAL | 0 refills | Status: DC

## 2018-04-23 NOTE — Telephone Encounter (Signed)
Noted. Okay to follow up as scheduled for next visit.

## 2018-04-23 NOTE — Progress Notes (Signed)
Safety precautions to be maintained throughout the outpatient stay will include: orient to surroundings, keep bed in low position, maintain call bell within reach at all times, provide assistance with transfer out of bed and ambulation.  

## 2018-04-23 NOTE — Patient Instructions (Addendum)
Take additional Vitamin B-12  ____________________________________________________________________________________________  Pain Scale  Introduction: The pain score used by this practice is the Verbal Numerical Rating Scale (VNRS-11). This is an 11-point scale. It is for adults and children 10 years or older. There are significant differences in how the pain score is reported, used, and applied. Forget everything you learned in the past and learn this scoring system.  General Information: The scale should reflect your current level of pain. Unless you are specifically asked for the level of your worst pain, or your average pain. If you are asked for one of these two, then it should be understood that it is over the past 24 hours.  Basic Activities of Daily Living (ADL): Personal hygiene, dressing, eating, transferring, and using restroom.  Instructions: Most patients tend to report their level of pain as a combination of two factors, their physical pain and their psychosocial pain. This last one is also known as "suffering" and it is reflection of how physical pain affects you socially and psychologically. From now on, report them separately. From this point on, when asked to report your pain level, report only your physical pain. Use the following table for reference.  Pain Clinic Pain Levels (0-5/10)  Pain Level Score  Description  No Pain 0   Mild pain 1 Nagging, annoying, but does not interfere with basic activities of daily living (ADL). Patients are able to eat, bathe, get dressed, toileting (being able to get on and off the toilet and perform personal hygiene functions), transfer (move in and out of bed or a chair without assistance), and maintain continence (able to control bladder and bowel functions). Blood pressure and heart rate are unaffected. A normal heart rate for a healthy adult ranges from 60 to 100 bpm (beats per minute).   Mild to moderate pain 2 Noticeable and distracting.  Impossible to hide from other people. More frequent flare-ups. Still possible to adapt and function close to normal. It can be very annoying and may have occasional stronger flare-ups. With discipline, patients may get used to it and adapt.   Moderate pain 3 Interferes significantly with activities of daily living (ADL). It becomes difficult to feed, bathe, get dressed, get on and off the toilet or to perform personal hygiene functions. Difficult to get in and out of bed or a chair without assistance. Very distracting. With effort, it can be ignored when deeply involved in activities.   Moderately severe pain 4 Impossible to ignore for more than a few minutes. With effort, patients may still be able to manage work or participate in some social activities. Very difficult to concentrate. Signs of autonomic nervous system discharge are evident: dilated pupils (mydriasis); mild sweating (diaphoresis); sleep interference. Heart rate becomes elevated (>115 bpm). Diastolic blood pressure (lower number) rises above 100 mmHg. Patients find relief in laying down and not moving.   Severe pain 5 Intense and extremely unpleasant. Associated with frowning face and frequent crying. Pain overwhelms the senses.  Ability to do any activity or maintain social relationships becomes significantly limited. Conversation becomes difficult. Pacing back and forth is common, as getting into a comfortable position is nearly impossible. Pain wakes you up from deep sleep. Physical signs will be obvious: pupillary dilation; increased sweating; goosebumps; brisk reflexes; cold, clammy hands and feet; nausea, vomiting or dry heaves; loss of appetite; significant sleep disturbance with inability to fall asleep or to remain asleep. When persistent, significant weight loss is observed due to the complete loss of appetite  and sleep deprivation.  Blood pressure and heart rate becomes significantly elevated. Caution: If elevated blood pressure  triggers a pounding headache associated with blurred vision, then the patient should immediately seek attention at an urgent or emergency care unit, as these may be signs of an impending stroke.    Emergency Department Pain Levels (6-10/10)  Emergency Room Pain 6 Severely limiting. Requires emergency care and should not be seen or managed at an outpatient pain management facility. Communication becomes difficult and requires great effort. Assistance to reach the emergency department may be required. Facial flushing and profuse sweating along with potentially dangerous increases in heart rate and blood pressure will be evident.   Distressing pain 7 Self-care is very difficult. Assistance is required to transport, or use restroom. Assistance to reach the emergency department will be required. Tasks requiring coordination, such as bathing and getting dressed become very difficult.   Disabling pain 8 Self-care is no longer possible. At this level, pain is disabling. The individual is unable to do even the most "basic" activities such as walking, eating, bathing, dressing, transferring to a bed, or toileting. Fine motor skills are lost. It is difficult to think clearly.   Incapacitating pain 9 Pain becomes incapacitating. Thought processing is no longer possible. Difficult to remember your own name. Control of movement and coordination are lost.   The worst pain imaginable 10 At this level, most patients pass out from pain. When this level is reached, collapse of the autonomic nervous system occurs, leading to a sudden drop in blood pressure and heart rate. This in turn results in a temporary and dramatic drop in blood flow to the brain, leading to a loss of consciousness. Fainting is one of the body's self defense mechanisms. Passing out puts the brain in a calmed state and causes it to shut down for a while, in order to begin the healing process.    Summary: 1. Refer to this scale when providing [place] with  your pain level. 2. Be accurate and careful when reporting your pain level. This will help with your care. 3. Over-reporting your pain level will lead to loss of credibility. 4. Even a level of 1/10 means that there is pain and will be treated at our facility. 5. High, inaccurate reporting will be documented as "Symptom Exaggeration", leading to loss of credibility and suspicions of possible secondary gains such as obtaining more narcotics, or wanting to appear disabled, for fraudulent reasons. 6. Only pain levels of 5 or below will be seen at our facility. 7. Pain levels of 6 and above will be sent to the Emergency Department and the appointment cancelled. ____________________________________________________________________________________________   ____________________________________________________________________________________________  Preparing for Procedure with Sedation  Instructions: . Oral Intake: Do not eat or drink anything for at least 8 hours prior to your procedure. . Transportation: Public transportation is not allowed. Bring an adult driver. The driver must be physically present in our waiting room before any procedure can be started. Marland Kitchen Physical Assistance: Bring an adult physically capable of assisting you, in the event you need help. This adult should keep you company at home for at least 6 hours after the procedure. . Blood Pressure Medicine: Take your blood pressure medicine with a sip of water the morning of the procedure. . Blood thinners: Notify our staff if you are taking any blood thinners. Depending on which one you take, there will be specific instructions on how and when to stop it. . Diabetics on insulin: Notify the staff  so that you can be scheduled 1st case in the morning. If your diabetes requires high dose insulin, take only  of your normal insulin dose the morning of the procedure and notify the staff that you have done so. . Preventing infections: Shower  with an antibacterial soap the morning of your procedure. . Build-up your immune system: Take 1000 mg of Vitamin C with every meal (3 times a day) the day prior to your procedure. Marland Kitchen Antibiotics: Inform the staff if you have a condition or reason that requires you to take antibiotics before dental procedures. . Pregnancy: If you are pregnant, call and cancel the procedure. . Sickness: If you have a cold, fever, or any active infections, call and cancel the procedure. . Arrival: You must be in the facility at least 30 minutes prior to your scheduled procedure. . Children: Do not bring children with you. . Dress appropriately: Bring dark clothing that you would not mind if they get stained. . Valuables: Do not bring any jewelry or valuables.  Procedure appointments are reserved for interventional treatments only. Marland Kitchen No Prescription Refills. . No medication changes will be discussed during procedure appointments. . No disability issues will be discussed.  Reasons to call and reschedule or cancel your procedure: (Following these recommendations will minimize the risk of a serious complication.) . Surgeries: Avoid having procedures within 2 weeks of any surgery. (Avoid for 2 weeks before or after any surgery). . Flu Shots: Avoid having procedures within 2 weeks of a flu shots or . (Avoid for 2 weeks before or after immunizations). . Barium: Avoid having a procedure within 7-10 days after having had a radiological study involving the use of radiological contrast. (Myelograms, Barium swallow or enema study). . Heart attacks: Avoid any elective procedures or surgeries for the initial 6 months after a "Myocardial Infarction" (Heart Attack). . Blood thinners: It is imperative that you stop these medications before procedures. Let us know if you if you take any blood thinner.  . Infection: Avoid procedures during or within two weeks of an infection (including chest colds or gastrointestinal problems).  Symptoms associated with infections include: Localized redness, fever, chills, night sweats or profuse sweating, burning sensation when voiding, cough, congestion, stuffiness, runny nose, sore throat, diarrhea, nausea, vomiting, cold or Flu symptoms, recent or current infections. It is specially important if the infection is over the area that we intend to treat. Marland Kitchen Heart and lung problems: Symptoms that may suggest an active cardiopulmonary problem include: cough, chest pain, breathing difficulties or shortness of breath, dizziness, ankle swelling, uncontrolled high or unusually low blood pressure, and/or palpitations. If you are experiencing any of these symptoms, cancel your procedure and contact your primary care physician for an evaluation.  Remember:  Regular Business hours are:  Monday to Thursday 8:00 AM to 4:00 PM  Provider's Schedule: Milinda Pointer, MD:  Procedure days: Tuesday and Thursday 7:30 AM to 4:00 PM  Gillis Santa, MD:  Procedure days: Monday and Wednesday 7:30 AM to 4:00 PM ____________________________________________________________________________________________  A prescription for Lyrica was sent to your pharmacy.

## 2018-04-24 DIAGNOSIS — D631 Anemia in chronic kidney disease: Secondary | ICD-10-CM | POA: Diagnosis not present

## 2018-04-24 DIAGNOSIS — J449 Chronic obstructive pulmonary disease, unspecified: Secondary | ICD-10-CM | POA: Diagnosis not present

## 2018-04-24 DIAGNOSIS — N182 Chronic kidney disease, stage 2 (mild): Secondary | ICD-10-CM | POA: Diagnosis not present

## 2018-04-24 DIAGNOSIS — I5032 Chronic diastolic (congestive) heart failure: Secondary | ICD-10-CM | POA: Diagnosis not present

## 2018-04-24 DIAGNOSIS — E1142 Type 2 diabetes mellitus with diabetic polyneuropathy: Secondary | ICD-10-CM | POA: Diagnosis not present

## 2018-04-24 DIAGNOSIS — I2510 Atherosclerotic heart disease of native coronary artery without angina pectoris: Secondary | ICD-10-CM | POA: Diagnosis not present

## 2018-04-24 DIAGNOSIS — I69354 Hemiplegia and hemiparesis following cerebral infarction affecting left non-dominant side: Secondary | ICD-10-CM | POA: Diagnosis not present

## 2018-04-24 DIAGNOSIS — I130 Hypertensive heart and chronic kidney disease with heart failure and stage 1 through stage 4 chronic kidney disease, or unspecified chronic kidney disease: Secondary | ICD-10-CM | POA: Diagnosis not present

## 2018-04-24 DIAGNOSIS — F411 Generalized anxiety disorder: Secondary | ICD-10-CM | POA: Diagnosis not present

## 2018-04-24 DIAGNOSIS — E1122 Type 2 diabetes mellitus with diabetic chronic kidney disease: Secondary | ICD-10-CM | POA: Diagnosis not present

## 2018-04-24 DIAGNOSIS — J9611 Chronic respiratory failure with hypoxia: Secondary | ICD-10-CM | POA: Diagnosis not present

## 2018-04-24 DIAGNOSIS — M726 Necrotizing fasciitis: Secondary | ICD-10-CM | POA: Diagnosis not present

## 2018-04-24 LAB — RHEUMATOID FACTOR: Rhuematoid fact SerPl-aCnc: <10 IU/mL (ref 0.0–13.9)

## 2018-04-24 LAB — ANA W/REFLEX IF POSITIVE: Anti Nuclear Antibody(ANA): NEGATIVE

## 2018-04-24 NOTE — Telephone Encounter (Signed)
Message left for [name] to return my call.

## 2018-04-26 DIAGNOSIS — I2510 Atherosclerotic heart disease of native coronary artery without angina pectoris: Secondary | ICD-10-CM | POA: Diagnosis not present

## 2018-04-26 DIAGNOSIS — D631 Anemia in chronic kidney disease: Secondary | ICD-10-CM | POA: Diagnosis not present

## 2018-04-26 DIAGNOSIS — E1122 Type 2 diabetes mellitus with diabetic chronic kidney disease: Secondary | ICD-10-CM | POA: Diagnosis not present

## 2018-04-26 DIAGNOSIS — I5032 Chronic diastolic (congestive) heart failure: Secondary | ICD-10-CM | POA: Diagnosis not present

## 2018-04-26 DIAGNOSIS — J449 Chronic obstructive pulmonary disease, unspecified: Secondary | ICD-10-CM | POA: Diagnosis not present

## 2018-04-26 DIAGNOSIS — F411 Generalized anxiety disorder: Secondary | ICD-10-CM | POA: Diagnosis not present

## 2018-04-26 DIAGNOSIS — N182 Chronic kidney disease, stage 2 (mild): Secondary | ICD-10-CM | POA: Diagnosis not present

## 2018-04-26 DIAGNOSIS — E1142 Type 2 diabetes mellitus with diabetic polyneuropathy: Secondary | ICD-10-CM | POA: Diagnosis not present

## 2018-04-26 DIAGNOSIS — I130 Hypertensive heart and chronic kidney disease with heart failure and stage 1 through stage 4 chronic kidney disease, or unspecified chronic kidney disease: Secondary | ICD-10-CM | POA: Diagnosis not present

## 2018-04-26 DIAGNOSIS — M726 Necrotizing fasciitis: Secondary | ICD-10-CM | POA: Diagnosis not present

## 2018-04-26 DIAGNOSIS — J9611 Chronic respiratory failure with hypoxia: Secondary | ICD-10-CM | POA: Diagnosis not present

## 2018-04-26 DIAGNOSIS — I69354 Hemiplegia and hemiparesis following cerebral infarction affecting left non-dominant side: Secondary | ICD-10-CM | POA: Diagnosis not present

## 2018-04-27 NOTE — Telephone Encounter (Signed)
Message left for Lavelle to return my call.

## 2018-04-28 LAB — TOXASSURE SELECT 13 (MW), URINE

## 2018-05-01 ENCOUNTER — Telehealth: Payer: Self-pay

## 2018-05-01 DIAGNOSIS — I5032 Chronic diastolic (congestive) heart failure: Secondary | ICD-10-CM | POA: Diagnosis not present

## 2018-05-01 DIAGNOSIS — M726 Necrotizing fasciitis: Secondary | ICD-10-CM | POA: Diagnosis not present

## 2018-05-01 DIAGNOSIS — F411 Generalized anxiety disorder: Secondary | ICD-10-CM | POA: Diagnosis not present

## 2018-05-01 DIAGNOSIS — E1122 Type 2 diabetes mellitus with diabetic chronic kidney disease: Secondary | ICD-10-CM | POA: Diagnosis not present

## 2018-05-01 DIAGNOSIS — E1142 Type 2 diabetes mellitus with diabetic polyneuropathy: Secondary | ICD-10-CM | POA: Diagnosis not present

## 2018-05-01 DIAGNOSIS — I130 Hypertensive heart and chronic kidney disease with heart failure and stage 1 through stage 4 chronic kidney disease, or unspecified chronic kidney disease: Secondary | ICD-10-CM | POA: Diagnosis not present

## 2018-05-01 DIAGNOSIS — N182 Chronic kidney disease, stage 2 (mild): Secondary | ICD-10-CM | POA: Diagnosis not present

## 2018-05-01 DIAGNOSIS — I2510 Atherosclerotic heart disease of native coronary artery without angina pectoris: Secondary | ICD-10-CM | POA: Diagnosis not present

## 2018-05-01 DIAGNOSIS — J9611 Chronic respiratory failure with hypoxia: Secondary | ICD-10-CM | POA: Diagnosis not present

## 2018-05-01 DIAGNOSIS — D631 Anemia in chronic kidney disease: Secondary | ICD-10-CM | POA: Diagnosis not present

## 2018-05-01 DIAGNOSIS — I69354 Hemiplegia and hemiparesis following cerebral infarction affecting left non-dominant side: Secondary | ICD-10-CM | POA: Diagnosis not present

## 2018-05-01 DIAGNOSIS — J449 Chronic obstructive pulmonary disease, unspecified: Secondary | ICD-10-CM | POA: Diagnosis not present

## 2018-05-01 NOTE — Telephone Encounter (Signed)
Called the insurance, Lyrica was approved 04-25-18. Patient notified.

## 2018-05-01 NOTE — Telephone Encounter (Signed)
Please call her regarding medications. She said her husband got a text on his phone saying the medication was authorized but the pharmacy is saying it is not.

## 2018-05-01 NOTE — Telephone Encounter (Signed)
Spoke with Dr. Dossie Arbour, gave verbal order to change Lyrica prescription to #60, take one 2 times per day. Landon at [facility] notified, patient notified.

## 2018-05-01 NOTE — Telephone Encounter (Signed)
Pharmacy at [facility] [phone]. States they tried to fill script and are not able to pull up approval. They called insurance and insurance willnot cover medication 3x day only 2 times day. Please call them asap and let them know where you got approval as Ms. Vachon is really giving them a hard time.   Please call Landon at [phone]

## 2018-05-01 NOTE — Telephone Encounter (Signed)
Contacted insurance, they will only cover 60 tabs per 30 days. Patient filled #60 tbs on 04-16-18. Will speak with Dr. Dossie Arbour about this.

## 2018-05-02 ENCOUNTER — Telehealth: Payer: Self-pay | Admitting: Primary Care

## 2018-05-02 NOTE — Telephone Encounter (Signed)
Spoken and notified patient of [name] Clark's comments. Patient verbalized understanding.  

## 2018-05-02 NOTE — Telephone Encounter (Signed)
Copied from [place] [phone]. Topic: Quick Communication - See Telephone Encounter >> May 02, 2018 12:37 PM Vernona Rieger wrote: CRM for notification. See Telephone encounter for: 05/02/18.  Olivia Mackie, RN with [facility] called to give an update for Anda Kraft. She said she is having pain in her lower extremities. Her pain level is 8 out of 10. She describes the pain as stinging and burning in her legs. Her husband states that she is kicking and restless at bedtime. She is wondering if her rOPINIRole (REQUIP) 0.5 MG tablet can be increased to two tablets at bedtime.   Call back @ [phone]

## 2018-05-02 NOTE — Telephone Encounter (Signed)
Yes, have her try two tablets at bedtime for restless legs and update me in one week.

## 2018-05-04 ENCOUNTER — Other Ambulatory Visit: Payer: Self-pay | Admitting: Primary Care

## 2018-05-04 DIAGNOSIS — I5032 Chronic diastolic (congestive) heart failure: Secondary | ICD-10-CM | POA: Diagnosis not present

## 2018-05-04 DIAGNOSIS — I2510 Atherosclerotic heart disease of native coronary artery without angina pectoris: Secondary | ICD-10-CM | POA: Diagnosis not present

## 2018-05-04 DIAGNOSIS — F411 Generalized anxiety disorder: Secondary | ICD-10-CM | POA: Diagnosis not present

## 2018-05-04 DIAGNOSIS — D631 Anemia in chronic kidney disease: Secondary | ICD-10-CM | POA: Diagnosis not present

## 2018-05-04 DIAGNOSIS — E1122 Type 2 diabetes mellitus with diabetic chronic kidney disease: Secondary | ICD-10-CM | POA: Diagnosis not present

## 2018-05-04 DIAGNOSIS — E1142 Type 2 diabetes mellitus with diabetic polyneuropathy: Secondary | ICD-10-CM | POA: Diagnosis not present

## 2018-05-04 DIAGNOSIS — I69354 Hemiplegia and hemiparesis following cerebral infarction affecting left non-dominant side: Secondary | ICD-10-CM | POA: Diagnosis not present

## 2018-05-04 DIAGNOSIS — I130 Hypertensive heart and chronic kidney disease with heart failure and stage 1 through stage 4 chronic kidney disease, or unspecified chronic kidney disease: Secondary | ICD-10-CM | POA: Diagnosis not present

## 2018-05-04 DIAGNOSIS — N182 Chronic kidney disease, stage 2 (mild): Secondary | ICD-10-CM | POA: Diagnosis not present

## 2018-05-04 DIAGNOSIS — M726 Necrotizing fasciitis: Secondary | ICD-10-CM | POA: Diagnosis not present

## 2018-05-04 DIAGNOSIS — J9611 Chronic respiratory failure with hypoxia: Secondary | ICD-10-CM | POA: Diagnosis not present

## 2018-05-04 DIAGNOSIS — J449 Chronic obstructive pulmonary disease, unspecified: Secondary | ICD-10-CM | POA: Diagnosis not present

## 2018-05-04 NOTE — Telephone Encounter (Signed)
Gave the approval for the verbal order. 

## 2018-05-04 NOTE — Telephone Encounter (Signed)
I spoke with Lovena Le at [facility] and notified as instructed and Lovena Le voiced understanding.

## 2018-05-04 NOTE — Telephone Encounter (Signed)
These RX have been added and change on 04/05/2018. Last office visit on 04/05/2018

## 2018-05-04 NOTE — Telephone Encounter (Signed)
Send Ozempic and glyburide to endocrinology. Send torsemide and metolazone to cardiology. Will send refills for famotidine

## 2018-05-08 ENCOUNTER — Ambulatory Visit: Payer: BLUE CROSS/BLUE SHIELD | Admitting: Pain Medicine

## 2018-05-09 DIAGNOSIS — I5032 Chronic diastolic (congestive) heart failure: Secondary | ICD-10-CM | POA: Diagnosis not present

## 2018-05-09 DIAGNOSIS — J9611 Chronic respiratory failure with hypoxia: Secondary | ICD-10-CM | POA: Diagnosis not present

## 2018-05-09 DIAGNOSIS — E1122 Type 2 diabetes mellitus with diabetic chronic kidney disease: Secondary | ICD-10-CM | POA: Diagnosis not present

## 2018-05-09 DIAGNOSIS — F411 Generalized anxiety disorder: Secondary | ICD-10-CM | POA: Diagnosis not present

## 2018-05-09 DIAGNOSIS — I130 Hypertensive heart and chronic kidney disease with heart failure and stage 1 through stage 4 chronic kidney disease, or unspecified chronic kidney disease: Secondary | ICD-10-CM | POA: Diagnosis not present

## 2018-05-09 DIAGNOSIS — M726 Necrotizing fasciitis: Secondary | ICD-10-CM | POA: Diagnosis not present

## 2018-05-09 DIAGNOSIS — J449 Chronic obstructive pulmonary disease, unspecified: Secondary | ICD-10-CM | POA: Diagnosis not present

## 2018-05-09 DIAGNOSIS — D631 Anemia in chronic kidney disease: Secondary | ICD-10-CM | POA: Diagnosis not present

## 2018-05-09 DIAGNOSIS — N182 Chronic kidney disease, stage 2 (mild): Secondary | ICD-10-CM | POA: Diagnosis not present

## 2018-05-09 DIAGNOSIS — I2510 Atherosclerotic heart disease of native coronary artery without angina pectoris: Secondary | ICD-10-CM | POA: Diagnosis not present

## 2018-05-09 DIAGNOSIS — E1142 Type 2 diabetes mellitus with diabetic polyneuropathy: Secondary | ICD-10-CM | POA: Diagnosis not present

## 2018-05-09 DIAGNOSIS — I69354 Hemiplegia and hemiparesis following cerebral infarction affecting left non-dominant side: Secondary | ICD-10-CM | POA: Diagnosis not present

## 2018-05-10 DIAGNOSIS — F411 Generalized anxiety disorder: Secondary | ICD-10-CM | POA: Diagnosis not present

## 2018-05-10 DIAGNOSIS — J449 Chronic obstructive pulmonary disease, unspecified: Secondary | ICD-10-CM | POA: Diagnosis not present

## 2018-05-10 DIAGNOSIS — N182 Chronic kidney disease, stage 2 (mild): Secondary | ICD-10-CM | POA: Diagnosis not present

## 2018-05-10 DIAGNOSIS — I130 Hypertensive heart and chronic kidney disease with heart failure and stage 1 through stage 4 chronic kidney disease, or unspecified chronic kidney disease: Secondary | ICD-10-CM | POA: Diagnosis not present

## 2018-05-10 DIAGNOSIS — I2510 Atherosclerotic heart disease of native coronary artery without angina pectoris: Secondary | ICD-10-CM | POA: Diagnosis not present

## 2018-05-10 DIAGNOSIS — E1122 Type 2 diabetes mellitus with diabetic chronic kidney disease: Secondary | ICD-10-CM | POA: Diagnosis not present

## 2018-05-10 DIAGNOSIS — I69354 Hemiplegia and hemiparesis following cerebral infarction affecting left non-dominant side: Secondary | ICD-10-CM | POA: Diagnosis not present

## 2018-05-10 DIAGNOSIS — I5032 Chronic diastolic (congestive) heart failure: Secondary | ICD-10-CM | POA: Diagnosis not present

## 2018-05-10 DIAGNOSIS — J9611 Chronic respiratory failure with hypoxia: Secondary | ICD-10-CM | POA: Diagnosis not present

## 2018-05-10 DIAGNOSIS — E1142 Type 2 diabetes mellitus with diabetic polyneuropathy: Secondary | ICD-10-CM | POA: Diagnosis not present

## 2018-05-10 DIAGNOSIS — D631 Anemia in chronic kidney disease: Secondary | ICD-10-CM | POA: Diagnosis not present

## 2018-05-10 DIAGNOSIS — M726 Necrotizing fasciitis: Secondary | ICD-10-CM | POA: Diagnosis not present

## 2018-05-16 ENCOUNTER — Ambulatory Visit: Payer: BLUE CROSS/BLUE SHIELD | Admitting: Pain Medicine

## 2018-05-18 DIAGNOSIS — G4733 Obstructive sleep apnea (adult) (pediatric): Secondary | ICD-10-CM | POA: Diagnosis not present

## 2018-05-21 ENCOUNTER — Other Ambulatory Visit: Payer: Self-pay | Admitting: Primary Care

## 2018-05-21 DIAGNOSIS — J449 Chronic obstructive pulmonary disease, unspecified: Secondary | ICD-10-CM

## 2018-05-21 NOTE — Telephone Encounter (Signed)
Is she still seeing pulmonology? Send fluoxetine request to psychiatry, I don't see this on our list. She may no longer be taking.

## 2018-05-21 NOTE — Telephone Encounter (Signed)
Last prescribed inhalers on 04/20/2018. [name] have not prescribed fluoxetine.  Last office visit on 04/05/2018

## 2018-05-22 ENCOUNTER — Ambulatory Visit (INDEPENDENT_AMBULATORY_CARE_PROVIDER_SITE_OTHER): Payer: BLUE CROSS/BLUE SHIELD | Admitting: Endocrinology

## 2018-05-22 ENCOUNTER — Encounter: Payer: Self-pay | Admitting: Endocrinology

## 2018-05-22 VITALS — BP 126/72 | HR 89 | Ht 64.0 in | Wt 269.0 lb

## 2018-05-22 DIAGNOSIS — M79671 Pain in right foot: Secondary | ICD-10-CM | POA: Diagnosis not present

## 2018-05-22 DIAGNOSIS — Z23 Encounter for immunization: Secondary | ICD-10-CM

## 2018-05-22 DIAGNOSIS — M79672 Pain in left foot: Secondary | ICD-10-CM

## 2018-05-22 DIAGNOSIS — Z794 Long term (current) use of insulin: Secondary | ICD-10-CM | POA: Diagnosis not present

## 2018-05-22 DIAGNOSIS — E1122 Type 2 diabetes mellitus with diabetic chronic kidney disease: Secondary | ICD-10-CM

## 2018-05-22 DIAGNOSIS — N183 Chronic kidney disease, stage 3 (moderate): Secondary | ICD-10-CM | POA: Diagnosis not present

## 2018-05-22 HISTORY — DX: Pain in right foot: M79.671

## 2018-05-22 HISTORY — DX: Pain in left foot: M79.672

## 2018-05-22 LAB — POCT GLYCOSYLATED HEMOGLOBIN (HGB A1C): Hemoglobin A1C: 9.2 % — AB (ref 4.0–5.6)

## 2018-05-22 MED ORDER — INSULIN REGULAR HUMAN (CONC) 500 UNIT/ML ~~LOC~~ SOPN: 150.0000 [IU] | PEN_INJECTOR | Freq: Two times a day (BID) | SUBCUTANEOUS | 11 refills | Status: DC

## 2018-05-22 NOTE — Patient Instructions (Addendum)
check your blood sugar twice a day.  vary the time of day when you check, between before the 3 meals, and at bedtime.  also check if you have symptoms of your blood sugar being too high or too low.  please keep a record of the readings and bring it to your next appointment here (or you can bring the meter itself).  You can write it on any piece of paper.  please call us sooner if your blood sugar goes below 70, or if you have a lot of readings over 200. Please increase the insulin to 150 units with breakfast and 150 units with supper, no matter what you blood sugar is. Please call or message [place] next week, to tell us how the blood sugar is doing.  Let's check the circulation test.  It is easy and painless.  you will receive a phone call, about a day and time for an appointment. Please come back for a follow-up appointment in 1 month. We can add the Ozempic if we need to.

## 2018-05-22 NOTE — Progress Notes (Signed)
Subjective:    Patient ID: [name], female    DOB: 1970-08-10, 47 y.o.   MRN: [id number]  HPI Pt returns for f/u of diabetes mellitus: DM type: Insulin-requiring type 2 Dx'ed: 1988.  Complications: polyneuropathy, TIA, renal insuff, and DR.   Therapy: insulin since soon after dx, and metformin GDM: never DKA: never Severe hypoglycemia: last episode was mid-2018 Pancreatitis: never Pancreatic imaging: normal on 2011 CT Other: she takes multiple daily injections; she takes U-500, due to severe insulin resistance.  She does not eat lunch, so she does not take insulin then.   Interval history:  no cbg record, but states cbg's vary from 73-525.  It is in general higher as the day goes on.  She takes just 200 units qpm.  She is afraid to take in am, out of fear of hypoglycemia.  She has bilat foot pain at rest.   Past Medical History:  Diagnosis Date  . Anxiety    takes Prozac daily  . Anxiety   . Aortic valve calcification   . Asthma    Advair and Spirva daily  . Asthma   . Bipolar disorder ([place])   . CAD (coronary artery disease)    a. LHC 11/2013 done for CP/fluid retention: mild disease in prox LAD, mild-mod disease in mRCA, EF 60% with normal LVEDP. b. Normal nuc 03/2016.  Marland Kitchen CHF (congestive heart failure) ([place])   . Chronic diastolic CHF (congestive heart failure) ([place])   . CKD (chronic kidney disease), stage II   . COPD (chronic obstructive pulmonary disease) ([facility])    a. nocturnal O2.  Marland Kitchen COPD (chronic obstructive pulmonary disease) ([place])   . Coronary artery disease   . Diabetes mellitus   . Diabetes mellitus without complication ([place])   . Dyspnea   . Family history of adverse reaction to anesthesia    mom gets nauseated  . GERD (gastroesophageal reflux disease)    takes Pepcid daily  . History of blood clots    left leg 3-11yrs ago  . Hyperlipidemia   . Hypertension   . Hypertriglyceridemia   . Inguinal hernia, left 01/2015  . Muscle spasm   . Peripheral  neuropathy   . RBBB   . Seizures ([place])   . Sinus tachycardia    a. persistent since 2009.  Marland Kitchen Smokers' cough ([place])   . Stroke [facility]) 1989   left sided weakness  . TIA (transient ischemic attack)   . Tobacco abuse     Past Surgical History:  Procedure Laterality Date  . HERNIA REPAIR    . INCISION AND DRAINAGE ABSCESS N/A 01/26/2018   Procedure: INCISION AND DEBRIDEMENT OF VULVAR NECROTIZING SOFT TISSUE INFECTION;  Surgeon: Greer Pickerel, MD;  Location: [place];  Service: General;  Laterality: N/A;  . INCISION AND DRAINAGE PERIRECTAL ABSCESS N/A 01/22/2018   Procedure: IRRIGATION AND DEBRIDEMENT LABIAL/VULVAR AREA;  Surgeon: Coralie Keens, MD;  Location: [place];  Service: General;  Laterality: N/A;  . INCISION AND DRAINAGE PERIRECTAL ABSCESS N/A 01/29/2018   Procedure: IRRIGATION AND DEBRIDEMENT VULVA;  Surgeon: Excell Seltzer, MD;  Location: [place];  Service: General;  Laterality: N/A;  . INGUINAL HERNIA REPAIR Left 04/08/2015   Procedure: OPEN LEFT INGUINAL HERNIA REPAIR WITH MESH;  Surgeon: Ralene Ok, MD;  Location: [place];  Service: General;  Laterality: Left;  . INSERTION OF MESH Left 04/08/2015   Procedure: INSERTION OF MESH;  Surgeon: Ralene Ok, MD;  Location: [place];  Service: General;  Laterality: Left;  .  LAPAROSCOPY     Endometriosis  . LEFT HEART CATHETERIZATION WITH CORONARY ANGIOGRAM N/A 12/05/2013   Procedure: LEFT HEART CATHETERIZATION WITH CORONARY ANGIOGRAM;  Surgeon: Jettie Booze, MD;  [facility] CATH LAB;  Service: Cardiovascular;  Laterality: N/A;  . right kidney drained    . TEE WITHOUT CARDIOVERSION N/A 11/28/2013   Procedure: TRANSESOPHAGEAL ECHOCARDIOGRAM (TEE);  Surgeon: Thayer Headings, MD;  Location: [facility] ENDOSCOPY;  Service: Cardiovascular;  Laterality: N/A;    Social History   Socioeconomic History  . Marital status: Married    Spouse name: Not on file  . Number of children: Not on file  . Years of education: Not on file  . Highest  education level: Not on file  Occupational History  . Not on file  Social Needs  . Financial resource strain: Not on file  . Food insecurity:    Worry: Not on file    Inability: Not on file  . Transportation needs:    Medical: Not on file    Non-medical: Not on file  Tobacco Use  . Smoking status: Current Every Day Smoker    Packs/day: 1.00    Years: 36.00    Pack years: 36.00    Types: Cigarettes    Last attempt to quit: 01/18/2018    Years since quitting: 0.3  . Smokeless tobacco: Never Used  Substance and Sexual Activity  . Alcohol use: No    Frequency: Never  . Drug use: No  . Sexual activity: Not Currently  Lifestyle  . Physical activity:    Days per week: Not on file    Minutes per session: Not on file  . Stress: Not on file  Relationships  . Social connections:    Talks on phone: Not on file    Gets together: Not on file    Attends religious service: Not on file    Active member of club or organization: Not on file    Attends meetings of clubs or organizations: Not on file    Relationship status: Not on file  . Intimate partner violence:    Fear of current or ex partner: Not on file    Emotionally abused: Not on file    Physically abused: Not on file    Forced sexual activity: Not on file  Other Topics Concern  . Not on file  Social History Narrative   ** Merged History Encounter **       Lives in [place]   Married but lives with Boyfriend - not legally separated   Disabled - for BiPolar, Seizure disorder, diabetes   Formerly worked at [employer]          Current Outpatient Medications on File Prior to Visit  Medication Sig Dispense Refill  . acetaminophen (TYLENOL) 500 MG tablet Take 500 mg by mouth every 4 (four) hours as needed.    Marland Kitchen albuterol (PROVENTIL HFA;VENTOLIN HFA) 108 (90 Base) MCG/ACT inhaler INHALE 2 PUFFS INTO LUNGS EVERY 4 HOURS AS NEEDED FOR SHORTNESS OF BREATH 6.7 g 0  . ALPRAZolam (XANAX) 1 MG tablet Take 1 mg by mouth 3 (three) times  daily as needed for anxiety.    Marland Kitchen aspirin EC 81 MG tablet Take 81 mg by mouth every morning.    . budesonide (PULMICORT) 0.5 MG/2ML nebulizer solution Take 0.5 mg by nebulization 2 (two) times daily.    . budesonide-formoterol (SYMBICORT) 80-4.5 MCG/ACT inhaler INHALE 2 PUFFS BY MOUTH EVERY 12 HOURS TO PREVENT COUGH OR WHEEZING *RINSE MOUTH  AFTER USING* 10.2 g 2  . busPIRone (BUSPAR) 30 MG tablet Take 30 mg by mouth 2 (two) times daily.    . famotidine (PEPCID) 20 MG tablet TAKE 1 TABLET BY MOUTH TWICE (2) DAILY FOR ACID INDIGESTION 180 tablet 3  . glucose blood (ONE TOUCH ULTRA TEST) test strip Used to check blood sugars 4x daily. 200 each 4  . Insulin Pen Needle 32G X 4 MM MISC Used to give insulin injections twice daily. 100 each 11  . ipratropium-albuterol (DUONEB) 0.5-2.5 (3) MG/3ML SOLN Take 3 mLs by nebulization every 4 (four) hours as needed (wheezing/shortness of breath). 360 mL 0  . loperamide (IMODIUM A-D) 2 MG tablet Take 4 mg by mouth 3 (three) times daily as needed for diarrhea or loose stools.    . metFORMIN (GLUCOPHAGE) 1000 MG tablet Take 1,000 mg by mouth 2 (two) times daily with a meal.    . metolazone (ZAROXOLYN) 2.5 MG tablet Take 2.5 mg by mouth every other day. Take 2.5 mg by mouth every 48 hours on Tuesday, Thursday and Saturday    . OXYGEN Inhale 4 L into the lungs at bedtime.     . pregabalin (LYRICA) 300 MG capsule Take 1 capsule (300 mg total) by mouth 3 (three) times daily. 90 capsule 0  . PRESCRIPTION MEDICATION Place 1 drop into both eyes daily as needed (every 14 weeks before injections).    . ranitidine (ZANTAC) 150 MG tablet Take 150 mg by mouth 2 (two) times daily.     Marland Kitchen rOPINIRole (REQUIP) 0.5 MG tablet Take 1 tablet by mouth at bedtime for restless legs. 90 tablet 0  . spironolactone (ALDACTONE) 50 MG tablet Take 50 mg by mouth daily.    Marland Kitchen torsemide (DEMADEX) 10 MG tablet Take 3 tablets by mouth once daily for swelling and fluid retention. (Patient taking  differently: Take 20-40 mg by mouth 2 (two) times daily. 40 mg in the morning and 20 mg in the evening) 90 tablet 0  . traZODone (DESYREL) 50 MG tablet Take 50-150 mg by mouth at bedtime.    Marland Kitchen linaclotide (LINZESS) 290 MCG CAPS capsule Take 1 capsule (290 mcg total) by mouth daily before breakfast. 90 capsule 0   No current facility-administered medications on file prior to visit.     Allergies  Allergen Reactions  . Adhesive [Tape] Rash and Other (See Comments)    TAKES OFF THE SKIN (CERTAIN MEDICAL TAPES DO THIS!!)  . Metoprolol Shortness Of Breath    Occurrence of shortness of breath after 3 days  . Montelukast Shortness Of Breath  . Morphine Sulfate Anaphylaxis, Shortness Of Breath and Nausea And Vomiting    Swollen Throat - Able to tolerate dilaudid  . Penicillins Anaphylaxis, Hives and Shortness Of Breath    Throat swells Has patient had a PCN reaction causing immediate rash, facial/tongue/throat swelling, SOB or lightheadedness with hypotension: Yes Has patient had a PCN reaction causing severe rash involving mucus membranes or skin necrosis: No Has patient had a PCN reaction that required hospitalization: Yes Has patient had a PCN reaction occurring within the last 10 years: No If all of the above answers are "NO", then may proceed with Cephalosporin use.   . Prednisone Anaphylaxis  . Diltiazem Swelling  . Gabapentin Swelling    Family History  Problem Relation Age of Onset  . Venous thrombosis Brother   . Other Brother        BRAIN TUMOR  . Asthma Father   . Diabetes  Father   . Coronary artery disease Mother   . Hypertension Mother   . Diabetes Mother   . Asthma Sister   . Diabetes type II Brother     BP 126/72 (BP Location: Right Arm, Patient Position: Sitting, Cuff Size: Large)   Pulse 89   Ht 5\' 4"  (1.626 m)   Wt 269 lb (122 kg)   LMP 11/11/2011   SpO2 90%   BMI 46.17 kg/m   Review of Systems She denies hypoglycemia    Objective:   Physical  Exam VITAL SIGNS:  See vs page GENERAL: no distress Pulses: dorsalis pedis intact bilat.   MSK: no deformity of the feet CV: 1+ bilat leg edema  Skin:  no ulcer on the feet, but the skin is very dry.  normal color and temp on the feet. Neuro: sensation is intact to touch on the feet, but severely decreased from normal Ext: There is severe bilateral onychomycosis of the toenails.   A1c=9.2%     Assessment & Plan:  Foot pain, new.  Poss claudication.  Insulin-requiring type 2 DM, with renal insuff: therapy limited by noncompliance with insulin.   Patient Instructions  check your blood sugar twice a day.  vary the time of day when you check, between before the 3 meals, and at bedtime.  also check if you have symptoms of your blood sugar being too high or too low.  please keep a record of the readings and bring it to your next appointment here (or you can bring the meter itself).  You can write it on any piece of paper.  please call us sooner if your blood sugar goes below 70, or if you have a lot of readings over 200. Please increase the insulin to 150 units with breakfast and 150 units with supper, no matter what you blood sugar is. Please call or message [place] next week, to tell us how the blood sugar is doing.  Let's check the circulation test.  It is easy and painless.  you will receive a phone call, about a day and time for an appointment. Please come back for a follow-up appointment in 1 month. We can add the Ozempic if we need to.

## 2018-05-22 NOTE — Telephone Encounter (Signed)
Message left for patient to return my call.  

## 2018-05-23 ENCOUNTER — Ambulatory Visit: Payer: Medicare Other | Admitting: Orthotics

## 2018-05-23 DIAGNOSIS — M79675 Pain in left toe(s): Principal | ICD-10-CM

## 2018-05-23 DIAGNOSIS — B351 Tinea unguium: Secondary | ICD-10-CM

## 2018-05-23 DIAGNOSIS — M79674 Pain in right toe(s): Secondary | ICD-10-CM

## 2018-05-23 DIAGNOSIS — M2010 Hallux valgus (acquired), unspecified foot: Secondary | ICD-10-CM

## 2018-05-23 DIAGNOSIS — E1159 Type 2 diabetes mellitus with other circulatory complications: Secondary | ICD-10-CM

## 2018-05-23 DIAGNOSIS — E1042 Type 1 diabetes mellitus with diabetic polyneuropathy: Secondary | ICD-10-CM

## 2018-05-23 NOTE — Progress Notes (Signed)

## 2018-05-25 ENCOUNTER — Telehealth: Payer: Self-pay

## 2018-05-25 NOTE — Telephone Encounter (Signed)
LM on VM for patient, ONO was normal. She does not need O2 at night at this time.

## 2018-05-30 NOTE — Telephone Encounter (Signed)
Message left for patient to return my call.  

## 2018-06-06 ENCOUNTER — Telehealth: Payer: Self-pay | Admitting: *Deleted

## 2018-06-06 DIAGNOSIS — G4733 Obstructive sleep apnea (adult) (pediatric): Secondary | ICD-10-CM

## 2018-06-06 NOTE — Telephone Encounter (Signed)
ONO will be repeated. Mask fit referral entered.

## 2018-06-06 NOTE — Telephone Encounter (Signed)
Tracey a home nurse from [facility] calling States that patient was on 4 1/2 liters of oxygen when test was placed May affect results  Please call to discuss

## 2018-06-06 NOTE — Telephone Encounter (Signed)
ONO order was for room air on cpap.  It appears that patient did study on 4 liters?  [employer] told patient patient she should never be without her 5.   ONO was done of 4.5 liters. ONO will need to be completed.  Pt needs mask fit referral.

## 2018-06-11 ENCOUNTER — Telehealth: Payer: Self-pay | Admitting: Primary Care

## 2018-06-11 NOTE — Telephone Encounter (Signed)
Patient called requesting to speak to nurse. [facility] has sent over multiple forms to be signed by provider and they have yet to receive one back. The most recent form was sent over this morning 06/11/18

## 2018-06-12 ENCOUNTER — Telehealth: Payer: Self-pay | Admitting: Endocrinology

## 2018-06-12 NOTE — Telephone Encounter (Signed)
Patient is calling back about message that was sent back yesterday. She has been waiting on a phone call from our office with an update and has not heard from anyone.

## 2018-06-12 NOTE — Telephone Encounter (Signed)
Please check previous phone note-patient is asking if we have received paperwork

## 2018-06-12 NOTE — Telephone Encounter (Signed)
Paperwork was placed on Dr. Cordelia Pen desk for review and signature yesterday evening. Called pt and informed her that he DOES have the paperwork and we will fax it once he has had a chance to review and sign it. Pt requested paperwork be expedited because the company is waiting on it. Provided reassurance that we will process her paperwork as quickly as we can. Paperwork is completed based on priority of medical necessity. Verbalized acceptance and understanding.

## 2018-06-13 ENCOUNTER — Ambulatory Visit (HOSPITAL_BASED_OUTPATIENT_CLINIC_OR_DEPARTMENT_OTHER): Payer: Medicare Other

## 2018-06-13 NOTE — Telephone Encounter (Signed)
I have a form about diabetic shoes.  Is this the form in question?

## 2018-06-13 NOTE — Telephone Encounter (Signed)
I have not seen this paperwork yet. Have you had a moment to complete and sign?

## 2018-06-14 NOTE — Telephone Encounter (Signed)
Per phone note on 06/11/18 forms were from [facility]

## 2018-06-14 NOTE — Telephone Encounter (Signed)
Called pt to make her aware of the information Dr. Loanne Drilling is needing in order to complete paperwork. After review of appts, pt is scheduled on 07/05/18. Pt has asked that we expedite completing the paperwork since her insurance expires at the end of this month. Advised this would be an unrealistic request given the short time frame. If appt is on 07/05/18 and we need to await the results, that would only allow our office 07/06/18 to complete and process the paperwork. In addition, Dr. Loanne Drilling will not be in the office beginning 07/02/18 and will not return to the office until 07/16/17. Cannot complete and process paperwork without Dr. Loanne Drilling. Pt sighed and verbalized understanding.

## 2018-06-14 NOTE — Telephone Encounter (Signed)
Arterial doppler is scheduled for a few weeks from now.  We need this to see if pt qualifies.

## 2018-06-15 ENCOUNTER — Ambulatory Visit: Payer: BLUE CROSS/BLUE SHIELD | Admitting: Primary Care

## 2018-06-15 ENCOUNTER — Other Ambulatory Visit: Payer: Self-pay | Admitting: Endocrinology

## 2018-06-15 ENCOUNTER — Ambulatory Visit (INDEPENDENT_AMBULATORY_CARE_PROVIDER_SITE_OTHER): Payer: BLUE CROSS/BLUE SHIELD | Admitting: Primary Care

## 2018-06-15 ENCOUNTER — Encounter: Payer: Self-pay | Admitting: Primary Care

## 2018-06-15 VITALS — BP 124/74 | HR 86 | Temp 98.2°F | Ht 64.0 in | Wt 265.0 lb

## 2018-06-15 DIAGNOSIS — E0801 Diabetes mellitus due to underlying condition with hyperosmolarity with coma: Secondary | ICD-10-CM

## 2018-06-15 DIAGNOSIS — G629 Polyneuropathy, unspecified: Secondary | ICD-10-CM

## 2018-06-15 DIAGNOSIS — F319 Bipolar disorder, unspecified: Secondary | ICD-10-CM

## 2018-06-15 DIAGNOSIS — N182 Chronic kidney disease, stage 2 (mild): Secondary | ICD-10-CM

## 2018-06-15 DIAGNOSIS — I10 Essential (primary) hypertension: Secondary | ICD-10-CM

## 2018-06-15 DIAGNOSIS — G2581 Restless legs syndrome: Secondary | ICD-10-CM

## 2018-06-15 DIAGNOSIS — K5900 Constipation, unspecified: Secondary | ICD-10-CM

## 2018-06-15 DIAGNOSIS — M792 Neuralgia and neuritis, unspecified: Secondary | ICD-10-CM

## 2018-06-15 DIAGNOSIS — G4730 Sleep apnea, unspecified: Secondary | ICD-10-CM

## 2018-06-15 DIAGNOSIS — R05 Cough: Secondary | ICD-10-CM

## 2018-06-15 DIAGNOSIS — E1042 Type 1 diabetes mellitus with diabetic polyneuropathy: Secondary | ICD-10-CM

## 2018-06-15 DIAGNOSIS — N898 Other specified noninflammatory disorders of vagina: Secondary | ICD-10-CM | POA: Diagnosis not present

## 2018-06-15 DIAGNOSIS — R058 Other specified cough: Secondary | ICD-10-CM

## 2018-06-15 DIAGNOSIS — J449 Chronic obstructive pulmonary disease, unspecified: Secondary | ICD-10-CM

## 2018-06-15 DIAGNOSIS — R600 Localized edema: Secondary | ICD-10-CM

## 2018-06-15 LAB — POC URINALSYSI DIPSTICK (AUTOMATED)
Bilirubin, UA: NEGATIVE
Blood, UA: NEGATIVE
Glucose, UA: NEGATIVE
Ketones, UA: NEGATIVE
Leukocytes, UA: NEGATIVE
Nitrite, UA: NEGATIVE
Protein, UA: NEGATIVE
Spec Grav, UA: 1.015 (ref 1.010–1.025)
Urobilinogen, UA: 0.2 E.U./dL
pH, UA: 6 (ref 5.0–8.0)

## 2018-06-15 MED ORDER — ALBUTEROL SULFATE HFA 108 (90 BASE) MCG/ACT IN AERS: INHALATION_SPRAY | RESPIRATORY_TRACT | 0 refills | Status: DC

## 2018-06-15 MED ORDER — PREGABALIN 300 MG PO CAPS: 300.0000 mg | ORAL_CAPSULE | Freq: Three times a day (TID) | ORAL | 0 refills | Status: DC

## 2018-06-15 NOTE — Assessment & Plan Note (Signed)
Following with pulmonology, has mask fitting scheduled for next week.

## 2018-06-15 NOTE — Patient Instructions (Addendum)
Make sure to follow up with the cardiologist as he requested.  Follow up with endocrinology and psychiatry as scheduled.  You can apply cortisone cream to the outside of the vagina once daily for irritation.   I will be in touch with your vaginal swab results tomorrow, be looking out for my message.  Please schedule a follow up appointment in 6 months. Have them schedule you at the end of a session.   It was a pleasure to see you today!

## 2018-06-15 NOTE — Assessment & Plan Note (Signed)
Doing well on Symbicort and compliant daily.  Using albuterol as needed with walking for prolonged periods of time.  Continue same.

## 2018-06-15 NOTE — Progress Notes (Signed)
Subjective:    Patient ID: [name], female    DOB: 11/10/1970, 47 y.o.   MRN: [id number]  HPI  [name] is a 47 year old female who presents today for follow up.  1) Hypertension/CHF/Venous statis/Hyperlipidemia: Following with cardiology, no recent visit. Compliant to spironolactone 50 mg mg daily, torsemide 30 mg daily, metolazone 2.5 mg every other day.   She is weighing herself every 3 days and endorses stable reading.   BP Readings from Last 3 Encounters:  06/15/18 124/74  05/22/18 126/72  04/23/18 136/65   Wt Readings from Last 3 Encounters:  06/15/18 265 lb (120.2 kg)  05/22/18 269 lb (122 kg)  04/23/18 250 lb (113.4 kg)     2) Type 2 Diabetes: Following with endocrinology and is compliant to Humilin U 500 and is injecting 150 units at bedtime most of the time, will sometimes inject 150 units in the morning if her glucose reading is at 300 or greater. She's checking her glucose twice daily.   AM fasting: 93-135 Before dinner: 300-400  She is due to see her endocrinologist in 2 weeks, will undergo bilateral lower extremity ultrasound on December 26th. Is working on getting diabetic shoes approved through endocrinology.   3) Restless Leg Syndrome/Chronic lower extremity pain/Neuropathy: Currently managed on ropinirole 0.5 mg HS and pregabalin 300 mg TID. She is no longer following with pain management and is needing a refill of her Lyrica.   4) Bipolar Disorder: Currently following with psychiatry, doing well on current regimen of buspirone, alprazolam, trazodone. Overall feels well managed.  5) Chronic Constipation: Currently managed on linaclotide 290 mcg. She hasn't had to use her Linzess recently. Is having bowel movements 2-3 times daily.   6) Vaginal Irritation: Vaginal itching and irritation to the outer part of the vagina that began 2-3 weeks ago. She's been using Monistat OTC to the outer vagina without improvement. She denies vaginal discharge, hematuria.    Review of Systems  Respiratory: Negative for shortness of breath.        Some exertional shortness of breath, chronic  Cardiovascular: Negative for chest pain.  Genitourinary: Positive for dysuria. Negative for pelvic pain and vaginal discharge.       Vaginal irritation   Neurological: Negative for dizziness and headaches.  Psychiatric/Behavioral:       Feels well managed on current regimen       Past Medical History:  Diagnosis Date  . Anxiety    takes Prozac daily  . Anxiety   . Aortic valve calcification   . Asthma    Advair and Spirva daily  . Asthma   . Bipolar disorder ([place])   . CAD (coronary artery disease)    a. LHC 11/2013 done for CP/fluid retention: mild disease in prox LAD, mild-mod disease in mRCA, EF 60% with normal LVEDP. b. Normal nuc 03/2016.  Marland Kitchen CHF (congestive heart failure) ([place])   . Chronic diastolic CHF (congestive heart failure) ([place])   . CKD (chronic kidney disease), stage II   . COPD (chronic obstructive pulmonary disease) ([facility])    a. nocturnal O2.  Marland Kitchen COPD (chronic obstructive pulmonary disease) ([place])   . Coronary artery disease   . Diabetes mellitus   . Diabetes mellitus without complication ([place])   . Dyspnea   . Family history of adverse reaction to anesthesia    mom gets nauseated  . GERD (gastroesophageal reflux disease)    takes Pepcid daily  . History of blood clots    left  leg 3-31yrs ago  . Hyperlipidemia   . Hypertension   . Hypertriglyceridemia   . Inguinal hernia, left 01/2015  . Muscle spasm   . Peripheral neuropathy   . RBBB   . Seizures ([place])   . Sinus tachycardia    a. persistent since 2009.  Marland Kitchen Smokers' cough ([place])   . Stroke [facility]) 1989   left sided weakness  . TIA (transient ischemic attack)   . Tobacco abuse      Social History   Socioeconomic History  . Marital status: Married    Spouse name: Not on file  . Number of children: Not on file  . Years of education: Not on file  . Highest education level: Not on  file  Occupational History  . Not on file  Social Needs  . Financial resource strain: Not on file  . Food insecurity:    Worry: Not on file    Inability: Not on file  . Transportation needs:    Medical: Not on file    Non-medical: Not on file  Tobacco Use  . Smoking status: Current Every Day Smoker    Packs/day: 1.00    Years: 36.00    Pack years: 36.00    Types: Cigarettes    Last attempt to quit: 01/18/2018    Years since quitting: 0.4  . Smokeless tobacco: Never Used  Substance and Sexual Activity  . Alcohol use: No    Frequency: Never  . Drug use: No  . Sexual activity: Not Currently  Lifestyle  . Physical activity:    Days per week: Not on file    Minutes per session: Not on file  . Stress: Not on file  Relationships  . Social connections:    Talks on phone: Not on file    Gets together: Not on file    Attends religious service: Not on file    Active member of club or organization: Not on file    Attends meetings of clubs or organizations: Not on file    Relationship status: Not on file  . Intimate partner violence:    Fear of current or ex partner: Not on file    Emotionally abused: Not on file    Physically abused: Not on file    Forced sexual activity: Not on file  Other Topics Concern  . Not on file  Social History Narrative   ** Merged History Encounter **       Lives in [place]   Married but lives with Boyfriend - not legally separated   Disabled - for BiPolar, Seizure disorder, diabetes   Formerly worked at [employer]          Past Surgical History:  Procedure Laterality Date  . HERNIA REPAIR    . INCISION AND DRAINAGE ABSCESS N/A 01/26/2018   Procedure: INCISION AND DEBRIDEMENT OF VULVAR NECROTIZING SOFT TISSUE INFECTION;  Surgeon: Greer Pickerel, MD;  Location: [place];  Service: General;  Laterality: N/A;  . INCISION AND DRAINAGE PERIRECTAL ABSCESS N/A 01/22/2018   Procedure: IRRIGATION AND DEBRIDEMENT LABIAL/VULVAR AREA;  Surgeon: Coralie Keens,  MD;  Location: [place];  Service: General;  Laterality: N/A;  . INCISION AND DRAINAGE PERIRECTAL ABSCESS N/A 01/29/2018   Procedure: IRRIGATION AND DEBRIDEMENT VULVA;  Surgeon: Excell Seltzer, MD;  Location: [place];  Service: General;  Laterality: N/A;  . INGUINAL HERNIA REPAIR Left 04/08/2015   Procedure: OPEN LEFT INGUINAL HERNIA REPAIR WITH MESH;  Surgeon: Ralene Ok, MD;  Location: [place];  Service: General;  Laterality: Left;  . INSERTION OF MESH Left 04/08/2015   Procedure: INSERTION OF MESH;  Surgeon: Ralene Ok, MD;  Location: [place];  Service: General;  Laterality: Left;  . LAPAROSCOPY     Endometriosis  . LEFT HEART CATHETERIZATION WITH CORONARY ANGIOGRAM N/A 12/05/2013   Procedure: LEFT HEART CATHETERIZATION WITH CORONARY ANGIOGRAM;  Surgeon: Jettie Booze, MD;  Location[facility] C[facility] LAB;  Service: Cardiovascular;  Laterality: N/A;  . right kidney drained    . TEE WITHOUT CARDIOVERSION N/A 11/28/2013   Procedure: TRANSESOPHAGEAL ECHOCARDIOGRAM (TEE);  Surgeon: Thayer Headings, MD;  Location[facility] ENDOSCOPY;  Service: Cardiovascular;  Laterality: N/A;    Family History  Problem Relation Age of Onset  . Venous thrombosis Brother   . Other Brother        BRAIN TUMOR  . Asthma Father   . Diabetes Father   . Coronary artery disease Mother   . Hypertension Mother   . Diabetes Mother   . Asthma Sister   . Diabetes type II Brother     Allergies  Allergen Reactions  . Adhesive [Tape] Rash and Other (See Comments)    TAKES OFF THE SKIN (CERTAIN MEDICAL TAPES DO THIS!!)  . Metoprolol Shortness Of Breath    Occurrence of shortness of breath after 3 days  . Montelukast Shortness Of Breath  . Morphine Sulfate Anaphylaxis, Shortness Of Breath and Nausea And Vomiting    Swollen Throat - Able to tolerate dilaudid  . Penicillins Anaphylaxis, Hives and Shortness Of Breath    Throat swells Has patient had a PCN reaction causing immediate rash, facial/tongue/throat swelling, SOB or  lightheadedness with hypotension: Yes Has patient had a PCN reaction causing severe rash involving mucus membranes or skin necrosis: No Has patient had a PCN reaction that required hospitalization: Yes Has patient had a PCN reaction occurring within the last 10 years: No If all of the above answers are "NO", then may proceed with Cephalosporin use.   . Prednisone Anaphylaxis  . Diltiazem Swelling  . Gabapentin Swelling    Current Outpatient Medications on File Prior to Visit  Medication Sig Dispense Refill  . acetaminophen (TYLENOL) 500 MG tablet Take 500 mg by mouth every 4 (four) hours as needed.    Marland Kitchen albuterol (PROVENTIL HFA;VENTOLIN HFA) 108 (90 Base) MCG/ACT inhaler INHALE 2 PUFFS INTO LUNGS EVERY 4 HOURS AS NEEDED FOR SHORTNESS OF BREATH 6.7 g 0  . ALPRAZolam (XANAX) 1 MG tablet Take 1 mg by mouth 3 (three) times daily as needed for anxiety.    Marland Kitchen aspirin EC 81 MG tablet Take 81 mg by mouth every morning.    . budesonide (PULMICORT) 0.5 MG/2ML nebulizer solution Take 0.5 mg by nebulization 2 (two) times daily.    . budesonide-formoterol (SYMBICORT) 80-4.5 MCG/ACT inhaler INHALE 2 PUFFS BY MOUTH EVERY 12 HOURS TO PREVENT COUGH OR WHEEZING *RINSE MOUTH AFTER USING* 10.2 g 2  . busPIRone (BUSPAR) 30 MG tablet Take 30 mg by mouth 2 (two) times daily.    . famotidine (PEPCID) 20 MG tablet TAKE 1 TABLET BY MOUTH TWICE (2) DAILY FOR ACID INDIGESTION 180 tablet 3  . glucose blood (ONE TOUCH ULTRA TEST) test strip Used to check blood sugars 4x daily. 200 each 4  . Insulin Pen Needle 32G X 4 MM MISC Used to give insulin injections twice daily. 100 each 11  . insulin regular human CONCENTRATED (HUMULIN R U-500 KWIKPEN) 500 UNIT/ML kwikpen Inject 150 Units into the skin 2 (  two) times daily with a meal. 12 pen 11  . ipratropium-albuterol (DUONEB) 0.5-2.5 (3) MG/3ML SOLN Take 3 mLs by nebulization every 4 (four) hours as needed (wheezing/shortness of breath). 360 mL 0  . loperamide (IMODIUM A-D) 2 MG  tablet Take 4 mg by mouth 3 (three) times daily as needed for diarrhea or loose stools.    . metFORMIN (GLUCOPHAGE) 1000 MG tablet Take 1,000 mg by mouth 2 (two) times daily with a meal.    . metolazone (ZAROXOLYN) 2.5 MG tablet Take 2.5 mg by mouth every other day. Take 2.5 mg by mouth every 48 hours on Tuesday, Thursday and Saturday    . OXYGEN Inhale 4 L into the lungs at bedtime.     Marland Kitchen PRESCRIPTION MEDICATION Place 1 drop into both eyes daily as needed (every 14 weeks before injections).    . ranitidine (ZANTAC) 150 MG tablet Take 150 mg by mouth 2 (two) times daily.     Marland Kitchen rOPINIRole (REQUIP) 0.5 MG tablet Take 1 tablet by mouth at bedtime for restless legs. 90 tablet 0  . spironolactone (ALDACTONE) 50 MG tablet Take 50 mg by mouth daily.    Marland Kitchen torsemide (DEMADEX) 10 MG tablet Take 3 tablets by mouth once daily for swelling and fluid retention. (Patient taking differently: Take 20-40 mg by mouth 2 (two) times daily. 40 mg in the morning and 20 mg in the evening) 90 tablet 0  . traZODone (DESYREL) 50 MG tablet Take 50-150 mg by mouth at bedtime.    Marland Kitchen linaclotide (LINZESS) 290 MCG CAPS capsule Take 1 capsule (290 mcg total) by mouth daily before breakfast. 90 capsule 0   No current facility-administered medications on file prior to visit.     BP 124/74   Pulse 86   Temp 98.2 F (36.8 C) (Oral)   Ht 5\' 4"  (1.626 m)   Wt 265 lb (120.2 kg)   LMP 11/11/2011   SpO2 98%   BMI 45.49 kg/m    Objective:   Physical Exam  Constitutional: She is oriented to person, place, and time. She appears well-nourished.  Cardiovascular: Normal rate and regular rhythm.  Appears euvolemic  Respiratory: Effort normal and breath sounds normal.  Genitourinary:    There is no tenderness on the right labia. There is no tenderness on the left labia. Cervix exhibits discharge. Cervix exhibits no motion tenderness. Right adnexum displays no tenderness. Left adnexum displays no tenderness. No vaginal discharge  found.  Genitourinary Comments: Mild whitish vaginal discharge. Irritation to labia majora.  Neurological: She is alert and oriented to person, place, and time.  Skin: Skin is warm and dry.  Psychiatric: She has a normal mood and affect.           Assessment & Plan:

## 2018-06-15 NOTE — Assessment & Plan Note (Signed)
Stable in the office today, continue spironolactone.

## 2018-06-15 NOTE — Assessment & Plan Note (Signed)
Renal function from July 2019 with improved levels.  Continue to monitor.

## 2018-06-15 NOTE — Assessment & Plan Note (Signed)
Doing well on current regimen of ropinirole. Continue same.

## 2018-06-15 NOTE — Assessment & Plan Note (Signed)
Following with endocrinology. Discussed to monitor glucose readings and avoid elimiting doses of insulin unless she is hypoglycemic. Continue current regimen.

## 2018-06-15 NOTE — Assessment & Plan Note (Addendum)
Mild vaginal irritation to the labia majora.  Wet prep sent off for testing.  Urinalysis today negative.

## 2018-06-15 NOTE — Assessment & Plan Note (Signed)
Following with psychiatry, feels well managed on current regimen.

## 2018-06-15 NOTE — Assessment & Plan Note (Signed)
Doing well on current regimen. Continue Symbicort. Using albuterol PRN when walking for prolonged periods of time. Continue current regimen.

## 2018-06-15 NOTE — Assessment & Plan Note (Signed)
Overall improved.  Continue torsemide daily and metolazone every other day.

## 2018-06-15 NOTE — Assessment & Plan Note (Signed)
No recent use of Linzess. Continue to monitor.

## 2018-06-16 LAB — WET PREP BY MOLECULAR PROBE
Candida species: NOT DETECTED
Gardnerella vaginalis: NOT DETECTED
MICRO NUMBER:: 91463889
SPECIMEN QUALITY:: ADEQUATE
Trichomonas vaginosis: NOT DETECTED

## 2018-06-17 DIAGNOSIS — G4733 Obstructive sleep apnea (adult) (pediatric): Secondary | ICD-10-CM | POA: Diagnosis not present

## 2018-06-19 ENCOUNTER — Ambulatory Visit (HOSPITAL_BASED_OUTPATIENT_CLINIC_OR_DEPARTMENT_OTHER): Payer: Medicare Other | Attending: Internal Medicine | Admitting: Radiology

## 2018-06-19 DIAGNOSIS — G4733 Obstructive sleep apnea (adult) (pediatric): Secondary | ICD-10-CM

## 2018-06-21 ENCOUNTER — Telehealth: Payer: Self-pay | Admitting: *Deleted

## 2018-06-21 ENCOUNTER — Ambulatory Visit: Payer: BLUE CROSS/BLUE SHIELD | Admitting: Endocrinology

## 2018-06-21 DIAGNOSIS — G2581 Restless legs syndrome: Secondary | ICD-10-CM

## 2018-06-21 MED ORDER — ROPINIROLE HCL 1 MG PO TABS: 1.0000 mg | ORAL_TABLET | Freq: Every day | ORAL | 1 refills | Status: DC

## 2018-06-21 NOTE — Telephone Encounter (Signed)
See 10/23 phone note  

## 2018-06-21 NOTE — Telephone Encounter (Signed)
Spoke to pt who states requip was increased from 0.5mg  1po qhs to 2tabs po qhs and she is needing a new Rx sent to [facility] with correct dosing instruction. pls advise

## 2018-06-21 NOTE — Telephone Encounter (Signed)
Please call patient, who increased her Requip to 1 mg?

## 2018-06-21 NOTE — Telephone Encounter (Signed)
Reviewed note. Please notify patient that I sent in a prescription for the 1 mg tablet and only take one of these at bedtime. This is the same as two of her 0.5 mg tablets.

## 2018-06-22 NOTE — Telephone Encounter (Signed)
Spoken and notified patient of [name] comments. Patient verbalized understanding.  Patient stated that she wanted to ask about menopause and what can she about these hot flashes and mood changes. Patient's phone does not work well so she stated that we can send a message through [facility].

## 2018-06-22 NOTE — Telephone Encounter (Signed)
Noted, will send my chart message.

## 2018-06-22 NOTE — Telephone Encounter (Signed)
Pt returning your call

## 2018-06-22 NOTE — Telephone Encounter (Signed)
Message left for patient to return my call.  

## 2018-06-26 DIAGNOSIS — I6521 Occlusion and stenosis of right carotid artery: Secondary | ICD-10-CM | POA: Diagnosis not present

## 2018-06-29 ENCOUNTER — Other Ambulatory Visit: Payer: Self-pay | Admitting: Primary Care

## 2018-06-29 DIAGNOSIS — G2581 Restless legs syndrome: Secondary | ICD-10-CM

## 2018-07-05 ENCOUNTER — Telehealth: Payer: Self-pay

## 2018-07-05 NOTE — Telephone Encounter (Signed)
Pt of [employer]. Sent to Dr Silvio Pate in her absence. Olivia Mackie is seeing pt in her home. Pt is having increased cough with bilateral wheezing in lung fields and clear to brown sputum. Increased SOB causing her to use 5 pillows to sleep. Weight has increased and Dr Rosalyn Gess advised to increase her fluid pill by 1. Pt has a h/o COPD and they are afraid she is having an exacerbation. There is no availability to be seen to day or tomorrow. Asking if an antibiotic can be called in. Please advise Olivia Mackie at [phone]

## 2018-07-05 NOTE — Telephone Encounter (Signed)
Spoke to [place]. She will advise pt. She is thinking it is more CHF because she ate more salt with the holidays. If she is not better by the morning with extra diuretics she will schedule an ov

## 2018-07-05 NOTE — Telephone Encounter (Signed)
I am not comfortable phoning in an antibiotic when it is not clearly an infection  Might need steroids, might need additional diuresis I am okay with adding on at 4:45 today or sometime tomorrow

## 2018-07-06 ENCOUNTER — Telehealth: Payer: Self-pay | Admitting: Internal Medicine

## 2018-07-06 NOTE — Telephone Encounter (Signed)
Spoke to patient, let her know we do not have any providers in our office until 07/12/18. She has tried her PCP, they do not have any openings. Suggested she try urgent care, gave her the number for [facility] urgent care. She will contact them.

## 2018-07-13 DIAGNOSIS — H2513 Age-related nuclear cataract, bilateral: Secondary | ICD-10-CM | POA: Diagnosis not present

## 2018-07-13 DIAGNOSIS — E113513 Type 2 diabetes mellitus with proliferative diabetic retinopathy with macular edema, bilateral: Secondary | ICD-10-CM | POA: Diagnosis not present

## 2018-07-13 DIAGNOSIS — H4311 Vitreous hemorrhage, right eye: Secondary | ICD-10-CM | POA: Diagnosis not present

## 2018-07-19 ENCOUNTER — Ambulatory Visit: Payer: Medicare Other | Admitting: Podiatry

## 2018-07-19 IMAGING — CR DG CHEST 2V
2 series · 2 of 2 positions shown · non-contrast
Comparison: Prior radiograph from 07/19/2017.

CLINICAL DATA: Initial evaluation for acute chest pain, shortness
of breath, productive cough.

EXAM:
CHEST - 2 VIEW

[chest lat]
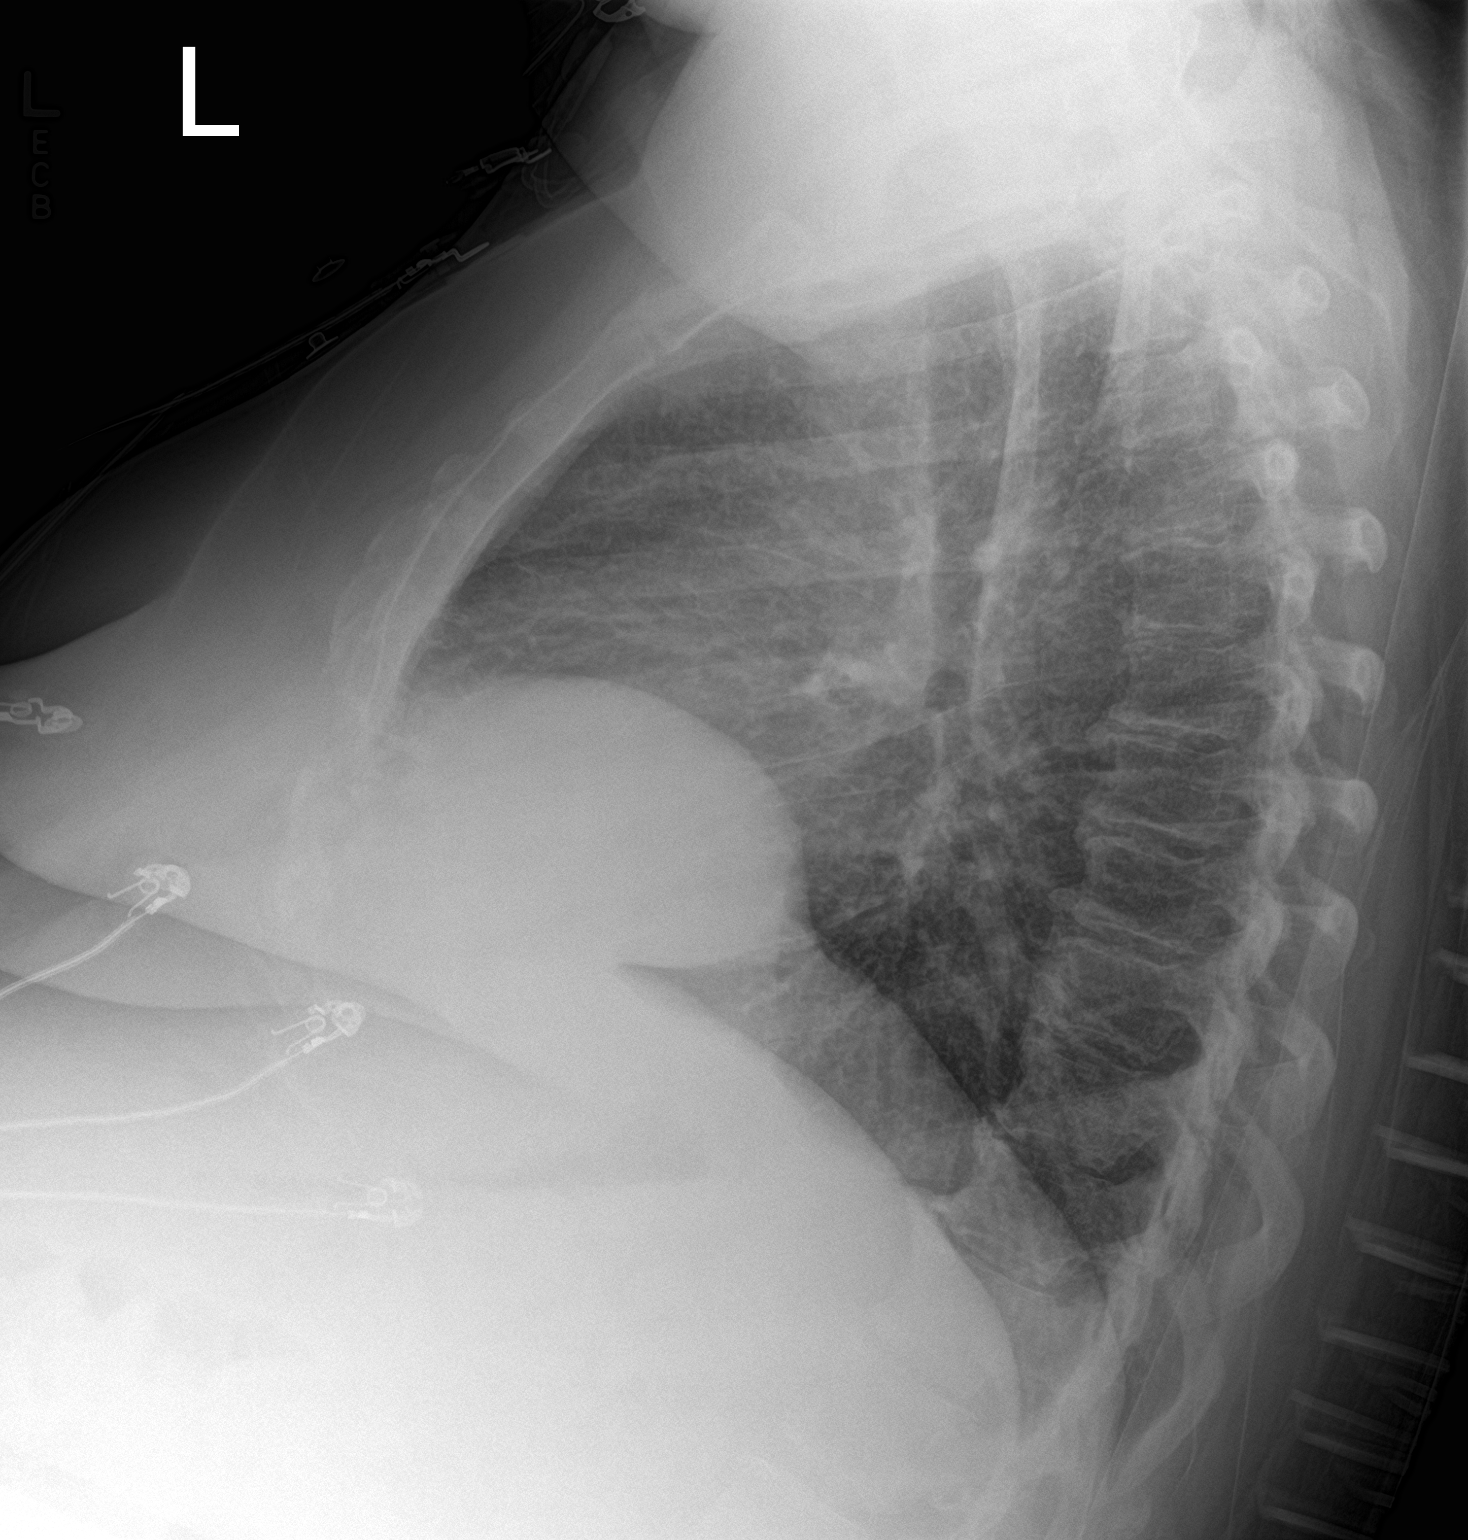

[chest ap]
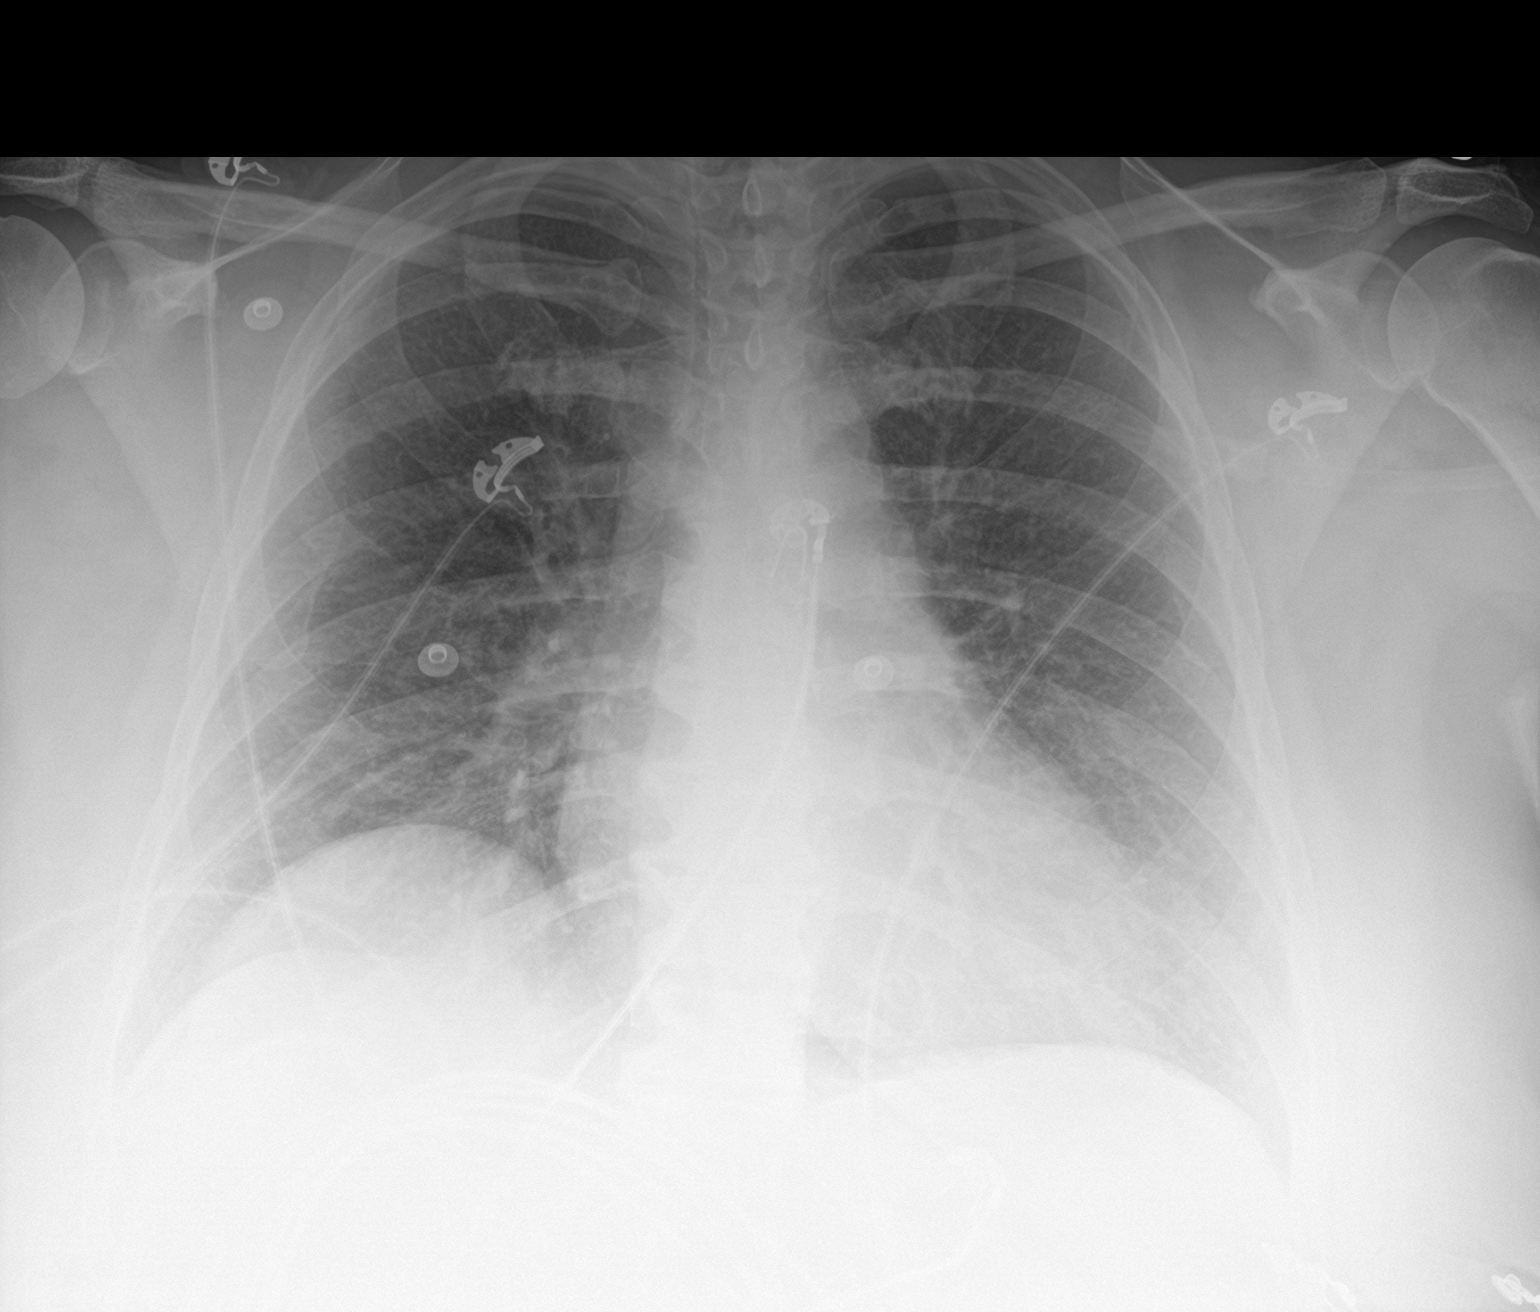

[2 of 2 positions shown; findings below may reference images not displayed]

FINDINGS: Mild cardiomegaly, stable from previous. Mediastinal silhouette
within normal limits.

Lungs mildly hypoinflated with elevation of the right hemidiaphragm,
stable. Scattered chronic bronchitic changes, likely related history
of COPD. Mild perihilar vascular congestion without pulmonary edema.
No consolidative airspace opacity. No pleural effusion. No
pneumothorax.

No acute osseous abnormality.
IMPRESSION: 1. Scattered bronchitic changes, likely related to history of COPD.
Appearance is similar to previous.
2. Mild cardiomegaly with perihilar vascular congestion without
overt pulmonary edema.

## 2018-07-20 ENCOUNTER — Ambulatory Visit (INDEPENDENT_AMBULATORY_CARE_PROVIDER_SITE_OTHER): Payer: BLUE CROSS/BLUE SHIELD | Admitting: Primary Care

## 2018-07-20 ENCOUNTER — Encounter: Payer: Self-pay | Admitting: Primary Care

## 2018-07-20 VITALS — BP 122/72 | HR 94 | Temp 98.0°F | Ht 64.0 in | Wt 280.8 lb

## 2018-07-20 DIAGNOSIS — J441 Chronic obstructive pulmonary disease with (acute) exacerbation: Secondary | ICD-10-CM | POA: Diagnosis not present

## 2018-07-20 MED ORDER — GUAIFENESIN-CODEINE 100-10 MG/5ML PO SYRP: 5.0000 mL | ORAL_SOLUTION | Freq: Three times a day (TID) | ORAL | 0 refills | Status: DC | PRN

## 2018-07-20 MED ORDER — DOXYCYCLINE HYCLATE 100 MG PO TABS: 100.0000 mg | ORAL_TABLET | Freq: Two times a day (BID) | ORAL | 0 refills | Status: DC

## 2018-07-20 NOTE — Patient Instructions (Signed)
Start Doxycycline antibiotic for the infection. Take 1 tablet by mouth twice daily for 10 days.  You may take the cough suppressant every 8 hours as needed for cough and rest. Caution this medication contains codeine which may cause drowsiness.   Continue using your nebulizer machine for shortness of breath and wheezing.  Make sure to drink plenty of water and rest.  It was a pleasure to see you today!

## 2018-07-20 NOTE — Assessment & Plan Note (Addendum)
Increased SOB with sputum production and purulent sputum. Given duration of symptoms, medical history, and presentation will treat. Rx for Doxycycline course and Cheratussin sent to pharmacy. She has had cough medication with codeine in the past and has had no problem. Fluids, rest, follow up PRN.

## 2018-07-20 NOTE — Progress Notes (Signed)
Subjective:    Patient ID: [name], female    DOB: 20-Oct-1970, 48 y.o.   MRN: [id number]  HPI  [name] is a 48 year old female with a complex medical history including COPD, tobacco abuse, CHF, hypertension, uncontrolled type 2 diabetes who presents today with a chief complaint of cough.  She also reports shortness of breath, wheezing, chest congestion. Her symptoms began 2 weeks ago, overall starting to get worse. Her cough is productive with yellow sputum. She's been taking Robitussin cough, warm honey, Mucinex Extreme Cold and Cough without improvement.   She denies fevers. Her torsemide was recently increased by her cardiologist given increase in weights.   Review of Systems  Constitutional: Positive for fatigue. Negative for fever.  HENT: Positive for congestion and sore throat. Negative for sinus pressure.   Respiratory: Positive for cough and shortness of breath.   Cardiovascular: Negative for chest pain.       Past Medical History:  Diagnosis Date  . Anxiety    takes Prozac daily  . Anxiety   . Aortic valve calcification   . Asthma    Advair and Spirva daily  . Asthma   . Bipolar disorder ([place])   . CAD (coronary artery disease)    a. LHC 11/2013 done for CP/fluid retention: mild disease in prox LAD, mild-mod disease in mRCA, EF 60% with normal LVEDP. b. Normal nuc 03/2016.  Marland Kitchen CHF (congestive heart failure) ([place])   . Chronic diastolic CHF (congestive heart failure) ([place])   . CKD (chronic kidney disease), stage II   . COPD (chronic obstructive pulmonary disease) (H[facility])    a. nocturnal O2.  Marland Kitchen COPD (chronic obstructive pulmonary disease) ([place])   . Coronary artery disease   . Decreased urine stream   . Diabetes mellitus   . Diabetes mellitus without complication ([place])   . Dyspnea   . Family history of adverse reaction to anesthesia    mom gets nauseated  . GERD (gastroesophageal reflux disease)    takes Pepcid daily  . History of blood clots    left leg  3-72yrs ago  . Hyperlipidemia   . Hypertension   . Hypertriglyceridemia   . Inguinal hernia, left 01/2015  . Muscle spasm   . Open wound of genital labia   . Peripheral neuropathy   . RBBB   . Seizures ([place])   . Sepsis ([place]) 01/19/2018  . Sinus tachycardia    a. persistent since 2009.  Marland Kitchen Smokers' cough ([place])   . Stroke [facility]) 1989   left sided weakness  . TIA (transient ischemic attack)   . Tobacco abuse   . Vulvar abscess 01/23/2018     Social History   Socioeconomic History  . Marital status: Married    Spouse name: Not on file  . Number of children: Not on file  . Years of education: Not on file  . Highest education level: Not on file  Occupational History  . Not on file  Social Needs  . Financial resource strain: Not on file  . Food insecurity:    Worry: Not on file    Inability: Not on file  . Transportation needs:    Medical: Not on file    Non-medical: Not on file  Tobacco Use  . Smoking status: Current Every Day Smoker    Packs/day: 1.00    Years: 36.00    Pack years: 36.00    Types: Cigarettes    Last attempt to quit: 01/18/2018  Years since quitting: 0.5  . Smokeless tobacco: Never Used  Substance and Sexual Activity  . Alcohol use: No    Frequency: Never  . Drug use: No  . Sexual activity: Not Currently  Lifestyle  . Physical activity:    Days per week: Not on file    Minutes per session: Not on file  . Stress: Not on file  Relationships  . Social connections:    Talks on phone: Not on file    Gets together: Not on file    Attends religious service: Not on file    Active member of club or organization: Not on file    Attends meetings of clubs or organizations: Not on file    Relationship status: Not on file  . Intimate partner violence:    Fear of current or ex partner: Not on file    Emotionally abused: Not on file    Physically abused: Not on file    Forced sexual activity: Not on file  Other Topics Concern  . Not on file  Social  History Narrative   ** Merged History Encounter **       Lives in [place]   Married but lives with Boyfriend - not legally separated   Disabled - for BiPolar, Seizure disorder, diabetes   Formerly worked at [employer]          Past Surgical History:  Procedure Laterality Date  . HERNIA REPAIR    . INCISION AND DRAINAGE ABSCESS N/A 01/26/2018   Procedure: INCISION AND DEBRIDEMENT OF VULVAR NECROTIZING SOFT TISSUE INFECTION;  Surgeon: Greer Pickerel, MD;  Location: [place];  Service: General;  Laterality: N/A;  . INCISION AND DRAINAGE PERIRECTAL ABSCESS N/A 01/22/2018   Procedure: IRRIGATION AND DEBRIDEMENT LABIAL/VULVAR AREA;  Surgeon: Coralie Keens, MD;  Location: [place];  Service: General;  Laterality: N/A;  . INCISION AND DRAINAGE PERIRECTAL ABSCESS N/A 01/29/2018   Procedure: IRRIGATION AND DEBRIDEMENT VULVA;  Surgeon: Excell Seltzer, MD;  Location: [place];  Service: General;  Laterality: N/A;  . INGUINAL HERNIA REPAIR Left 04/08/2015   Procedure: OPEN LEFT INGUINAL HERNIA REPAIR WITH MESH;  Surgeon: Ralene Ok, MD;  Location: [place];  Service: General;  Laterality: Left;  . INSERTION OF MESH Left 04/08/2015   Procedure: INSERTION OF MESH;  Surgeon: Ralene Ok, MD;  Location: [place];  Service: General;  Laterality: Left;  . LAPAROSCOPY     Endometriosis  . LEFT HEART CATHETERIZATION WITH CORONARY ANGIOGRAM N/A 12/05/2013   Procedure: LEFT HEART CATHETERIZATION WITH CORONARY ANGIOGRAM;  Surgeon: Jettie Booze, MD;  Location: [facility] CATH LAB;  Service: Cardiovascular;  Laterality: N/A;  . right kidney drained    . TEE WITHOUT CARDIOVERSION N/A 11/28/2013   Procedure: TRANSESOPHAGEAL ECHOCARDIOGRAM (TEE);  Surgeon: Thayer Headings, MD;  Location: [facility] ENDOSCOPY;  Service: Cardiovascular;  Laterality: N/A;    Family History  Problem Relation Age of Onset  . Venous thrombosis Brother   . Other Brother        BRAIN TUMOR  . Asthma Father   . Diabetes Father   . Coronary artery  disease Mother   . Hypertension Mother   . Diabetes Mother   . Asthma Sister   . Diabetes type II Brother     Allergies  Allergen Reactions  . Adhesive [Tape] Rash and Other (See Comments)    TAKES OFF THE SKIN (CERTAIN MEDICAL TAPES DO THIS!!)  . Metoprolol Shortness Of Breath    Occurrence of shortness of breath  after 3 days  . Montelukast Shortness Of Breath  . Morphine Sulfate Anaphylaxis, Shortness Of Breath and Nausea And Vomiting    Swollen Throat - Able to tolerate dilaudid  . Penicillins Anaphylaxis, Hives and Shortness Of Breath    Throat swells Has patient had a PCN reaction causing immediate rash, facial/tongue/throat swelling, SOB or lightheadedness with hypotension: Yes Has patient had a PCN reaction causing severe rash involving mucus membranes or skin necrosis: No Has patient had a PCN reaction that required hospitalization: Yes Has patient had a PCN reaction occurring within the last 10 years: No If all of the above answers are "NO", then may proceed with Cephalosporin use.   . Prednisone Anaphylaxis  . Diltiazem Swelling  . Gabapentin Swelling    Current Outpatient Medications on File Prior to Visit  Medication Sig Dispense Refill  . acetaminophen (TYLENOL) 500 MG tablet Take 500 mg by mouth every 4 (four) hours as needed.    Marland Kitchen albuterol (PROVENTIL HFA;VENTOLIN HFA) 108 (90 Base) MCG/ACT inhaler INHALE 2 PUFFS INTO LUNGS EVERY 4 HOURS AS NEEDED FOR SHORTNESS OF BREATH 6.7 g 0  . ALPRAZolam (XANAX) 1 MG tablet Take 1 mg by mouth 3 (three) times daily as needed for anxiety.    Marland Kitchen aspirin EC 81 MG tablet Take 81 mg by mouth every morning.    . budesonide (PULMICORT) 0.5 MG/2ML nebulizer solution Take 0.5 mg by nebulization 2 (two) times daily.    . budesonide-formoterol (SYMBICORT) 80-4.5 MCG/ACT inhaler INHALE 2 PUFFS BY MOUTH EVERY 12 HOURS TO PREVENT COUGH OR WHEEZING *RINSE MOUTH AFTER USING* 10.2 g 2  . busPIRone (BUSPAR) 30 MG tablet Take 30 mg by mouth 2  (two) times daily.    . famotidine (PEPCID) 20 MG tablet TAKE 1 TABLET BY MOUTH TWICE (2) DAILY FOR ACID INDIGESTION 180 tablet 3  . glucose blood (ONE TOUCH ULTRA TEST) test strip Used to check blood sugars 4x daily. 200 each 4  . Insulin Pen Needle 32G X 4 MM MISC Used to give insulin injections twice daily. 100 each 11  . insulin regular human CONCENTRATED (HUMULIN R U-500 KWIKPEN) 500 UNIT/ML kwikpen Inject 150 Units into the skin 2 (two) times daily with a meal. 12 pen 11  . ipratropium-albuterol (DUONEB) 0.5-2.5 (3) MG/3ML SOLN Take 3 mLs by nebulization every 4 (four) hours as needed (wheezing/shortness of breath). 360 mL 0  . loperamide (IMODIUM A-D) 2 MG tablet Take 4 mg by mouth 3 (three) times daily as needed for diarrhea or loose stools.    . metFORMIN (GLUCOPHAGE) 1000 MG tablet Take 1,000 mg by mouth 2 (two) times daily with a meal.    . metolazone (ZAROXOLYN) 2.5 MG tablet Take 2.5 mg by mouth every other day. Take 2.5 mg by mouth every 48 hours on Tuesday, Thursday and Saturday    . OXYGEN Inhale 4 L into the lungs at bedtime.     . pregabalin (LYRICA) 300 MG capsule Take 1 capsule (300 mg total) by mouth 3 (three) times daily. For neuropathy 270 capsule 0  . PRESCRIPTION MEDICATION Place 1 drop into both eyes daily as needed (every 14 weeks before injections).    . ranitidine (ZANTAC) 150 MG tablet Take 150 mg by mouth 2 (two) times daily.     Marland Kitchen rOPINIRole (REQUIP) 1 MG tablet Take 1 tablet (1 mg total) by mouth at bedtime. For restless legs. 90 tablet 1  . spironolactone (ALDACTONE) 50 MG tablet Take 50 mg by mouth daily.    Marland Kitchen  torsemide (DEMADEX) 10 MG tablet Take 3 tablets by mouth once daily for swelling and fluid retention. (Patient taking differently: Take 20-40 mg by mouth 2 (two) times daily. 40 mg in the morning and 20 mg in the evening) 90 tablet 0  . traZODone (DESYREL) 50 MG tablet Take 50-150 mg by mouth at bedtime.    Marland Kitchen ULTICARE MINI PEN NEEDLES 31G X 6 MM MISC USE AS  DIRECTED 3 TIMES DAILY 100 each 0  . linaclotide (LINZESS) 290 MCG CAPS capsule Take 1 capsule (290 mcg total) by mouth daily before breakfast. 90 capsule 0   No current facility-administered medications on file prior to visit.     BP 122/72   Pulse 94   Temp 98 F (36.7 C) (Oral)   Ht 5\' 4"  (1.626 m)   Wt 280 lb 12 oz (127.3 kg)   LMP 11/11/2011   SpO2 98%   BMI 48.19 kg/m    Objective:   Physical Exam  Constitutional: She appears well-nourished. She does not appear ill.  HENT:  Right Ear: Tympanic membrane and ear canal normal.  Left Ear: Tympanic membrane and ear canal normal.  Nose: Mucosal edema present. Right sinus exhibits no maxillary sinus tenderness and no frontal sinus tenderness. Left sinus exhibits no maxillary sinus tenderness and no frontal sinus tenderness.  Mouth/Throat: Oropharynx is clear and moist.  Neck: Neck supple.  Cardiovascular: Normal rate and regular rhythm.  Respiratory: Effort normal. She has wheezes in the right upper field, the right lower field, the left upper field and the left lower field. She has rhonchi in the right upper field and the left upper field.  Mild wheezing throughout fields  Skin: Skin is warm and dry.           Assessment & Plan:

## 2018-07-25 ENCOUNTER — Other Ambulatory Visit: Payer: Self-pay | Admitting: Endocrinology

## 2018-08-02 ENCOUNTER — Ambulatory Visit: Payer: Medicare Other | Admitting: Podiatry

## 2018-08-02 ENCOUNTER — Ambulatory Visit (INDEPENDENT_AMBULATORY_CARE_PROVIDER_SITE_OTHER): Payer: BLUE CROSS/BLUE SHIELD | Admitting: Family Medicine

## 2018-08-02 ENCOUNTER — Encounter: Payer: Self-pay | Admitting: Family Medicine

## 2018-08-02 VITALS — BP 116/58 | HR 89 | Temp 98.1°F | Ht 64.0 in | Wt 285.5 lb

## 2018-08-02 DIAGNOSIS — J449 Chronic obstructive pulmonary disease, unspecified: Secondary | ICD-10-CM | POA: Diagnosis not present

## 2018-08-02 DIAGNOSIS — Z720 Tobacco use: Secondary | ICD-10-CM | POA: Diagnosis not present

## 2018-08-02 DIAGNOSIS — I509 Heart failure, unspecified: Secondary | ICD-10-CM | POA: Diagnosis not present

## 2018-08-02 DIAGNOSIS — J069 Acute upper respiratory infection, unspecified: Secondary | ICD-10-CM | POA: Diagnosis not present

## 2018-08-02 NOTE — Progress Notes (Signed)
Subjective:     [name] is a 48 y.o. female presenting for Generalized Body Aches (x 2 weeks. Sore throat, cough, chest congestion, nasal drainage, runny nose. No fever.. patient saw Alma Friendly on 07/20/2018 an dwas given Doxy and cough syrup-she finished both of these medications. Patient states she did not feel any better once she finished this medication.)     URI   This is a new problem. The current episode started 1 to 4 weeks ago. The problem has been gradually worsening. There has been no fever. Associated symptoms include congestion, coughing (brown, and yellow), headaches, rhinorrhea, a sore throat and wheezing. Pertinent negatives include no abdominal pain, chest pain, diarrhea, ear pain, nausea, plugged ear sensation, sinus pain or vomiting. Treatments tried: antibiotic, cough syrup, nyquil, robutussin cough. The treatment provided mild relief.   Recent exposure to possible to laryngitis  Symptoms worse with smoking   COPD - symbicort BID - has not been using rescue inhaler - does use Budesonide nebulizer TID   #CHF - weighing herself  - has noticed a 20 lb weight gain at home and today - is taking an extra fluid pill   Review of Systems  HENT: Positive for congestion, postnasal drip, rhinorrhea and sore throat. Negative for ear pain and sinus pain.   Respiratory: Positive for cough (brown, and yellow) and wheezing.   Cardiovascular: Negative for chest pain.  Gastrointestinal: Negative for abdominal pain, diarrhea, nausea and vomiting.  Musculoskeletal: Positive for myalgias.  Neurological: Positive for headaches.     07/20/2018: Clinic - COPD exacerbation - doxy and cough suppressant   Social History   Tobacco Use  Smoking Status Current Every Day Smoker  . Packs/day: 1.00  . Years: 36.00  . Pack years: 36.00  . Types: Cigarettes  . Last attempt to quit: 01/18/2018  . Years since quitting: 0.5  Smokeless Tobacco Never Used        Objective:     BP Readings from Last 3 Encounters:  08/02/18 (!) 116/58  07/20/18 122/72  06/15/18 124/74   Wt Readings from Last 3 Encounters:  08/02/18 285 lb 8 oz (129.5 kg)  07/20/18 280 lb 12 oz (127.3 kg)  06/15/18 265 lb (120.2 kg)    BP (!) 116/58   Pulse 89   Temp 98.1 F (36.7 C)   Ht 5\' 4"  (1.626 m)   Wt 285 lb 8 oz (129.5 kg)   LMP 11/11/2011   SpO2 95%   BMI 49.01 kg/m    Physical Exam Constitutional:      General: She is not in acute distress.    Appearance: She is well-developed. She is not diaphoretic.  HENT:     Head: Normocephalic and atraumatic.     Right Ear: Tympanic membrane and ear canal normal.     Left Ear: Tympanic membrane and ear canal normal.     Nose: Mucosal edema and rhinorrhea present.     Right Sinus: No maxillary sinus tenderness or frontal sinus tenderness.     Left Sinus: No maxillary sinus tenderness or frontal sinus tenderness.     Mouth/Throat:     Mouth: Mucous membranes are moist.     Pharynx: Uvula midline. Oropharyngeal exudate and posterior oropharyngeal erythema present.     Tonsils: Tonsillar exudate present. Swelling: 0 on the right. 0 on the left.  Eyes:     General: No scleral icterus.    Conjunctiva/sclera: Conjunctivae normal.  Neck:     Musculoskeletal: Neck supple.  Cardiovascular:     Rate and Rhythm: Normal rate and regular rhythm.     Heart sounds: Normal heart sounds. No murmur.  Pulmonary:     Effort: Pulmonary effort is normal. No respiratory distress.     Breath sounds: No decreased air movement. Wheezing present.  Musculoskeletal:     Right lower leg: Edema present.     Left lower leg: Edema present.  Lymphadenopathy:     Cervical: No cervical adenopathy.  Skin:    General: Skin is warm and dry.     Capillary Refill: Capillary refill takes less than 2 seconds.  Neurological:     Mental Status: She is alert.    Rapid strep: negative       Assessment & Plan:   Problem List Items Addressed This Visit       Cardiovascular and Mediastinum   Chronic congestive heart failure ([place])    Seems to have weight gain, but reports swelling at baseline and no crackles on exam. Given presents of URI symptoms and sore throat low suspicion for cardiac source though if represents may want to get CXR to r/o effusion. Per patient report already on higher dose of Torsemide.         Respiratory   COPD GOLD 0      Other   Tobacco abuse    Other Visit Diagnoses    Viral URI    -  Primary   Relevant Orders   POCT rapid strep A     Highly considered steroids, though patient with allergy. Discussed that diffuse wheezes on exam and would recommend steroids. Also advised reaching out or discussing with pulmonology at next visit an action plan for treatment of exacerbation.   Rescue inhaler 3 times daily x 2 days Declined nasal sprays which would be helpful Symptomatic care Negative strep Avoid smoke exposure     Return in about 2 weeks (around 08/16/2018).  Lesleigh [name], MD

## 2018-08-02 NOTE — Assessment & Plan Note (Signed)
Seems to have weight gain, but reports swelling at baseline and no crackles on exam. Given presents of URI symptoms and sore throat low suspicion for cardiac source though if represents may want to get CXR to r/o effusion. Per patient report already on higher dose of Torsemide.

## 2018-08-02 NOTE — Patient Instructions (Addendum)
Based on your symptoms, it looks like you have a virus.   Antibiotics are not need for a viral infection but the following will help:   1. Drink plenty of fluids 2. Get lots of rest  Sinus Congestion 1) Over the counter congestion medications 2) Drink hot liquids or soup  Cough 1) Cough drops can be helpful 2) Nyquil (or nighttime cough medication) 3) Honey is proven to be one of the best cough medications   Sore Throat 1) Honey as above, cough drops 2) Tylenol can be helpful 3) Salt water Gargles 4) Chloraseptic spray  COPD 1) Consider using your rescue 3 times a day for next 1-2 days 2) Avoiding smoke exposure  If you develop fevers (Temperature >100.4), chills, worsening symptoms or symptoms lasting longer than 10 days return to clinic.

## 2018-08-03 DIAGNOSIS — E113513 Type 2 diabetes mellitus with proliferative diabetic retinopathy with macular edema, bilateral: Secondary | ICD-10-CM | POA: Diagnosis not present

## 2018-08-03 LAB — POCT RAPID STREP A (OFFICE): Rapid Strep A Screen: NEGATIVE

## 2018-08-07 ENCOUNTER — Telehealth: Payer: Self-pay

## 2018-08-07 ENCOUNTER — Ambulatory Visit (INDEPENDENT_AMBULATORY_CARE_PROVIDER_SITE_OTHER): Payer: BLUE CROSS/BLUE SHIELD | Admitting: Primary Care

## 2018-08-07 ENCOUNTER — Ambulatory Visit (INDEPENDENT_AMBULATORY_CARE_PROVIDER_SITE_OTHER)
Admission: RE | Admit: 2018-08-07 | Discharge: 2018-08-07 | Disposition: A | Discharge status: Home / Self Care | Payer: BLUE CROSS/BLUE SHIELD | Source: Ambulatory Visit | Attending: Primary Care | Admitting: Primary Care

## 2018-08-07 ENCOUNTER — Encounter: Payer: Self-pay | Admitting: Primary Care

## 2018-08-07 VITALS — BP 124/70 | HR 93 | Temp 97.8°F | Ht 64.0 in | Wt 286.2 lb

## 2018-08-07 DIAGNOSIS — I509 Heart failure, unspecified: Secondary | ICD-10-CM

## 2018-08-07 DIAGNOSIS — J410 Simple chronic bronchitis: Secondary | ICD-10-CM

## 2018-08-07 DIAGNOSIS — B370 Candidal stomatitis: Secondary | ICD-10-CM | POA: Diagnosis not present

## 2018-08-07 DIAGNOSIS — J029 Acute pharyngitis, unspecified: Secondary | ICD-10-CM

## 2018-08-07 DIAGNOSIS — J449 Chronic obstructive pulmonary disease, unspecified: Secondary | ICD-10-CM | POA: Diagnosis not present

## 2018-08-07 DIAGNOSIS — R05 Cough: Secondary | ICD-10-CM | POA: Diagnosis not present

## 2018-08-07 DIAGNOSIS — R0602 Shortness of breath: Secondary | ICD-10-CM | POA: Diagnosis not present

## 2018-08-07 LAB — POCT RAPID STREP A (OFFICE): Rapid Strep A Screen: NEGATIVE

## 2018-08-07 MED ORDER — NYSTATIN 100000 UNIT/ML MT SUSP: OROMUCOSAL | 0 refills | Status: DC

## 2018-08-07 NOTE — Progress Notes (Signed)
Subjective:    Patient ID: [name], female    DOB: 25-Jul-1970, 48 y.o.   MRN: [id number]  HPI  [name] is a 48 year old female who presents today for follow up.  She was initially evaluated by myself on 07/20/18 for complaints of cough, shortness of breath, wheezing, chest congestion. She had no improvement with OTC treatment. Her exam was suspicious for COPD exacerbation so she was prescribed Doxycycline and Cheratussin. She has an anaphylactic reaction to prednisone so this was not prescribed.   She returned on 08/02/18 with complaints cough, sore throat, congestion, rhinorrhea, fevers. She was using Symbicort BID and budesonide nebulizer, no use of albuterol. She also endorsed weight gain. She was told by the nurse visiting today that she had white spots to the back of her throat.  She returns today with reports of sore throat, continued cough, and shortness of breath. She denies fevers. Her cough is productive with light brown color. She is smoking 5 cigarettes daily.   She is using her Symbicort BID, also albuterol inhaler BID if needed. She is compliant to her oxygen when sleeping, napping, or with shortness of breath. She has an appointment with pulmonology in mid February 2020, she did call their office and report her symptoms. She was told to sit and rest, not over exert herself.  She is compliant to Zyrtec.    Wt Readings from Last 3 Encounters:  08/07/18 286 lb 4 oz (129.8 kg)  08/02/18 285 lb 8 oz (129.5 kg)  07/20/18 280 lb 12 oz (127.3 kg)   She is monitoring her weights daily, no weight fluctuations.   Review of Systems  Constitutional: Negative for chills and fever.  HENT: Positive for congestion and postnasal drip. Negative for sore throat.   Respiratory: Positive for cough, shortness of breath and wheezing.   Cardiovascular: Negative for chest pain.       Past Medical History:  Diagnosis Date  . Anxiety    takes Prozac daily  . Anxiety   . Aortic  valve calcification   . Asthma    Advair and Spirva daily  . Asthma   . Bipolar disorder ([place])   . CAD (coronary artery disease)    a. LHC 11/2013 done for CP/fluid retention: mild disease in prox LAD, mild-mod disease in mRCA, EF 60% with normal LVEDP. b. Normal nuc 03/2016.  Marland Kitchen CHF (congestive heart failure) ([place])   . Chronic diastolic CHF (congestive heart failure) ([place])   . CKD (chronic kidney disease), stage II   . COPD (chronic obstructive pulmonary disease) ([facility])    a. nocturnal O2.  Marland Kitchen COPD (chronic obstructive pulmonary disease) ([place])   . Coronary artery disease   . Decreased urine stream   . Diabetes mellitus   . Diabetes mellitus without complication ([place])   . Dyspnea   . Family history of adverse reaction to anesthesia    mom gets nauseated  . GERD (gastroesophageal reflux disease)    takes Pepcid daily  . History of blood clots    left leg 3-43yrs ago  . Hyperlipidemia   . Hypertension   . Hypertriglyceridemia   . Inguinal hernia, left 01/2015  . Muscle spasm   . Open wound of genital labia   . Peripheral neuropathy   . RBBB   . Seizures ([place])   . Sepsis ([place]) 01/19/2018  . Sinus tachycardia    a. persistent since 2009.  Marland Kitchen Smokers' cough ([place])   . Stroke [facility]) 1989  left sided weakness  . TIA (transient ischemic attack)   . Tobacco abuse   . Vulvar abscess 01/23/2018     Social History   Socioeconomic History  . Marital status: Married    Spouse name: Not on file  . Number of children: Not on file  . Years of education: Not on file  . Highest education level: Not on file  Occupational History  . Not on file  Social Needs  . Financial resource strain: Not on file  . Food insecurity:    Worry: Not on file    Inability: Not on file  . Transportation needs:    Medical: Not on file    Non-medical: Not on file  Tobacco Use  . Smoking status: Current Every Day Smoker    Packs/day: 1.00    Years: 36.00    Pack years: 36.00    Types: Cigarettes     Last attempt to quit: 01/18/2018    Years since quitting: 0.5  . Smokeless tobacco: Never Used  Substance and Sexual Activity  . Alcohol use: No    Frequency: Never  . Drug use: No  . Sexual activity: Not Currently  Lifestyle  . Physical activity:    Days per week: Not on file    Minutes per session: Not on file  . Stress: Not on file  Relationships  . Social connections:    Talks on phone: Not on file    Gets together: Not on file    Attends religious service: Not on file    Active member of club or organization: Not on file    Attends meetings of clubs or organizations: Not on file    Relationship status: Not on file  . Intimate partner violence:    Fear of current or ex partner: Not on file    Emotionally abused: Not on file    Physically abused: Not on file    Forced sexual activity: Not on file  Other Topics Concern  . Not on file  Social History Narrative   ** Merged History Encounter **       Lives in [place]   Married but lives with Boyfriend - not legally separated   Disabled - for BiPolar, Seizure disorder, diabetes   Formerly worked at [employer]          Past Surgical History:  Procedure Laterality Date  . HERNIA REPAIR    . INCISION AND DRAINAGE ABSCESS N/A 01/26/2018   Procedure: INCISION AND DEBRIDEMENT OF VULVAR NECROTIZING SOFT TISSUE INFECTION;  Surgeon: Greer Pickerel, MD;  Location: [facility];  Service: General;  Laterality: N/A;  . INCISION AND DRAINAGE PERIRECTAL ABSCESS N/A 01/22/2018   Procedure: IRRIGATION AND DEBRIDEMENT LABIAL/VULVAR AREA;  Surgeon: Coralie Keens, MD;  Location: [facility];  Service: General;  Laterality: N/A;  . INCISION AND DRAINAGE PERIRECTAL ABSCESS N/A 01/29/2018   Procedure: IRRIGATION AND DEBRIDEMENT VULVA;  Surgeon: Excell Seltzer, MD;  Location: [facility];  Service: General;  Laterality: N/A;  . INGUINAL HERNIA REPAIR Left 04/08/2015   Procedure: OPEN LEFT INGUINAL HERNIA REPAIR WITH MESH;  Surgeon: Ralene Ok, MD;  Location:  [facility];  Service: General;  Laterality: Left;  . INSERTION OF MESH Left 04/08/2015   Procedure: INSERTION OF MESH;  Surgeon: Ralene Ok, MD;  Location: [facility];  Service: General;  Laterality: Left;  . LAPAROSCOPY     Endometriosis  . LEFT HEART CATHETERIZATION WITH CORONARY ANGIOGRAM N/A 12/05/2013   Procedure: LEFT HEART CATHETERIZATION WITH CORONARY  ANGIOGRAM;  Surgeon: Jettie Booze, MD;  Location: [facility] CATH LAB;  Service: Cardiovascular;  Laterality: N/A;  . right kidney drained    . TEE WITHOUT CARDIOVERSION N/A 11/28/2013   Procedure: TRANSESOPHAGEAL ECHOCARDIOGRAM (TEE);  Surgeon: Thayer Headings, MD;  Location: [facility] ENDOSCOPY;  Service: Cardiovascular;  Laterality: N/A;    Family History  Problem Relation Age of Onset  . Venous thrombosis Brother   . Other Brother        BRAIN TUMOR  . Asthma Father   . Diabetes Father   . Coronary artery disease Mother   . Hypertension Mother   . Diabetes Mother   . Asthma Sister   . Diabetes type II Brother     Allergies  Allergen Reactions  . Adhesive [Tape] Rash and Other (See Comments)    TAKES OFF THE SKIN (CERTAIN MEDICAL TAPES DO THIS!!)  . Metoprolol Shortness Of Breath    Occurrence of shortness of breath after 3 days  . Montelukast Shortness Of Breath  . Morphine Sulfate Anaphylaxis, Shortness Of Breath and Nausea And Vomiting    Swollen Throat - Able to tolerate dilaudid  . Penicillins Anaphylaxis, Hives and Shortness Of Breath    Throat swells Has patient had a PCN reaction causing immediate rash, facial/tongue/throat swelling, SOB or lightheadedness with hypotension: Yes Has patient had a PCN reaction causing severe rash involving mucus membranes or skin necrosis: No Has patient had a PCN reaction that required hospitalization: Yes Has patient had a PCN reaction occurring within the last 10 years: No If all of the above answers are "NO", then may proceed with Cephalosporin use.   . Prednisone Anaphylaxis  .  Diltiazem Swelling  . Gabapentin Swelling    Current Outpatient Medications on File Prior to Visit  Medication Sig Dispense Refill  . acetaminophen (TYLENOL) 500 MG tablet Take 500 mg by mouth every 4 (four) hours as needed.    Marland Kitchen albuterol (PROVENTIL HFA;VENTOLIN HFA) 108 (90 Base) MCG/ACT inhaler INHALE 2 PUFFS INTO LUNGS EVERY 4 HOURS AS NEEDED FOR SHORTNESS OF BREATH 6.7 g 0  . ALPRAZolam (XANAX) 1 MG tablet Take 1 mg by mouth 3 (three) times daily as needed for anxiety.    Marland Kitchen aspirin EC 81 MG tablet Take 81 mg by mouth every morning.    . budesonide (PULMICORT) 0.5 MG/2ML nebulizer solution Take 0.5 mg by nebulization 2 (two) times daily.    . budesonide-formoterol (SYMBICORT) 80-4.5 MCG/ACT inhaler INHALE 2 PUFFS BY MOUTH EVERY 12 HOURS TO PREVENT COUGH OR WHEEZING *RINSE MOUTH AFTER USING* 10.2 g 2  . busPIRone (BUSPAR) 30 MG tablet Take 30 mg by mouth 2 (two) times daily.    . famotidine (PEPCID) 20 MG tablet TAKE 1 TABLET BY MOUTH TWICE (2) DAILY FOR ACID INDIGESTION 180 tablet 3  . FLUoxetine (PROZAC) 40 MG capsule     . glucose blood (ONE TOUCH ULTRA TEST) test strip Used to check blood sugars 4x daily. 200 each 4  . Insulin Pen Needle 32G X 4 MM MISC Used to give insulin injections twice daily. 100 each 11  . insulin regular human CONCENTRATED (HUMULIN R U-500 KWIKPEN) 500 UNIT/ML kwikpen Inject 150 Units into the skin 2 (two) times daily with a meal. 12 pen 11  . ipratropium-albuterol (DUONEB) 0.5-2.5 (3) MG/3ML SOLN Take 3 mLs by nebulization every 4 (four) hours as needed (wheezing/shortness of breath). 360 mL 0  . loperamide (IMODIUM A-D) 2 MG tablet Take 4 mg by mouth 3 (three)  times daily as needed for diarrhea or loose stools.    . metFORMIN (GLUCOPHAGE) 1000 MG tablet Take 1,000 mg by mouth 2 (two) times daily with a meal.    . metolazone (ZAROXOLYN) 2.5 MG tablet Take 2.5 mg by mouth every other day. Take 2.5 mg by mouth every 48 hours on Tuesday, Thursday and Saturday    .  OXYGEN Inhale 4 L into the lungs at bedtime.     . pregabalin (LYRICA) 300 MG capsule Take 1 capsule (300 mg total) by mouth 3 (three) times daily. For neuropathy 270 capsule 0  . PRESCRIPTION MEDICATION Place 1 drop into both eyes daily as needed (every 14 weeks before injections).    . ranitidine (ZANTAC) 150 MG tablet Take 150 mg by mouth 2 (two) times daily.     Marland Kitchen rOPINIRole (REQUIP) 1 MG tablet Take 1 tablet (1 mg total) by mouth at bedtime. For restless legs. 90 tablet 1  . spironolactone (ALDACTONE) 50 MG tablet Take 50 mg by mouth daily.    Marland Kitchen torsemide (DEMADEX) 10 MG tablet Take 3 tablets by mouth once daily for swelling and fluid retention. (Patient taking differently: Take 20-40 mg by mouth 2 (two) times daily. 40 mg in the morning and 20 mg in the evening) 90 tablet 0  . traZODone (DESYREL) 50 MG tablet Take 50-150 mg by mouth at bedtime.    Marland Kitchen ULTICARE MINI PEN NEEDLES 31G X 6 MM MISC USE AS DIRECTED THREE TIMES DAILY 100 each 0  . linaclotide (LINZESS) 290 MCG CAPS capsule Take 1 capsule (290 mcg total) by mouth daily before breakfast. 90 capsule 0   No current facility-administered medications on file prior to visit.     BP 124/70   Pulse 93   Temp 97.8 F (36.6 C) (Oral)   Ht 5\' 4"  (1.626 m)   Wt 286 lb 4 oz (129.8 kg)   LMP 11/11/2011   SpO2 96%   BMI 49.13 kg/m    Objective:   Physical Exam  Vitals reviewed. Constitutional: She is oriented to person, place, and time. She appears well-nourished. She does not appear ill.  HENT:  Right Ear: Tympanic membrane and ear canal normal.  Left Ear: Tympanic membrane and ear canal normal.  Nose: No mucosal edema. Right sinus exhibits no maxillary sinus tenderness and no frontal sinus tenderness. Left sinus exhibits no maxillary sinus tenderness and no frontal sinus tenderness.  Mouth/Throat: Oropharynx is clear and moist.  Accumulation of white substance to posterior tongue and pharynx involving uvula   Neck: Neck supple.    Cardiovascular: Normal rate and regular rhythm.  Respiratory: Effort normal. No accessory muscle usage. No respiratory distress. She has wheezes in the right upper field and the left upper field.  Mild congestion throughout  Neurological: She is alert and oriented to person, place, and time.  Skin: Skin is warm and dry.           Assessment & Plan:

## 2018-08-07 NOTE — Patient Instructions (Addendum)
Resume guaifenesin (Mucinex), take as directed with a full glass of water.  Stop smoking, this will help the most.  Complete xray(s) prior to leaving today. I will notify you of your results once received.  Follow up with the pulmonologist as scheduled.   Continue inhalers as prescribed. Make sure to rinse your mouth thoroughly after each use.  Start Nystatin solution for spots in the throat. Gargle, swish, swallow 5 mls four times daily for one week. Try to retain the liquid in your mouth for as long as possible before swallowing.  Go to the hospital if your breathing gets worse, you develop fevers of 101 or higher, you feel worse.   It was a pleasure to see you today!

## 2018-08-07 NOTE — Assessment & Plan Note (Signed)
Symptoms today likely secondary to chronic bronchitis from COPD. Exam today overall stable, vitals with oxygen saturation of 96% on room air. Already treated with antibiotics, history of anaphylaxis to prednisone.  Check chest x-ray today to ensure no developing pneumonia. Continue inhalers as prescribed. Follow-up with pulmonology as scheduled.  Oral thrush noted to posterior pharynx, prescription for nystatin swish and swallow provided.  Also instructed her to rinse her mouth after each inhaler use.

## 2018-08-07 NOTE — Assessment & Plan Note (Signed)
Suspect symptoms today to be secondary. Continue regular inhalers and oxygen as needed. Follow-up with pulmonology as scheduled. She appears stable today, vitals are stable.

## 2018-08-07 NOTE — Telephone Encounter (Signed)
[name] nurse with [facility] wanted Nancy Fitz NP to know this information prior to pts appt 08/07/18 at 3PM. Pt has ST, white patches on pts tongue,some wheezing on lt upper lung, prod cough with light tan phlegm and chest congestion. FYI to Nancy Fitz NP.

## 2018-08-07 NOTE — Telephone Encounter (Signed)
Noted and patient evaluated.

## 2018-08-07 NOTE — Assessment & Plan Note (Signed)
Weight has gradually increased since September 2019. Recent weight has been stable, 1 pound gain since 1 week ago.  She is also monitoring daily weights and is following with cardiology closely regarding her weight.

## 2018-08-09 ENCOUNTER — Ambulatory Visit: Payer: Medicare Other | Admitting: Podiatry

## 2018-08-21 ENCOUNTER — Telehealth: Payer: Self-pay

## 2018-08-21 DIAGNOSIS — R296 Repeated falls: Secondary | ICD-10-CM

## 2018-08-21 NOTE — Telephone Encounter (Signed)
Approved.  

## 2018-08-21 NOTE — Telephone Encounter (Signed)
Olivia Mackie nurse with [facility] a palliative program with [facility]. Olivia Mackie is seeing pt today and pt fell multiple times on 08/19/18. Left leg feels numb and weak; pt leg gives way with pt. Pt has had neuropathy. Pt is at home now and will start to use walker again. Pt is scheduled for [place] of both lower extremities on 08/22/18 which was ordered by Dr Loanne Drilling. Pt last saw Gentry Fitz NP on 08/07/18. Olivia Mackie requesting [facility] for lt leg strengthening and to instruct pt on using walker in the home. Olivia Mackie request cb.

## 2018-08-22 ENCOUNTER — Ambulatory Visit (INDEPENDENT_AMBULATORY_CARE_PROVIDER_SITE_OTHER): Payer: BLUE CROSS/BLUE SHIELD

## 2018-08-22 DIAGNOSIS — M79671 Pain in right foot: Secondary | ICD-10-CM

## 2018-08-22 DIAGNOSIS — M79672 Pain in left foot: Secondary | ICD-10-CM | POA: Diagnosis not present

## 2018-08-22 NOTE — Telephone Encounter (Signed)
Pt left v/m; pt has used [facility]; contact # [phone] or [phone]; they are open M-F 8-5.

## 2018-08-22 NOTE — Telephone Encounter (Signed)
Spoken to [place] on 08/21/2018 and notified of Tawni Millers comments. She stated we will need to place the order for [facility] PT for patient. Will forward message to [place].

## 2018-08-22 NOTE — Telephone Encounter (Signed)
Noted  Referral placed.

## 2018-08-23 ENCOUNTER — Telehealth: Payer: Self-pay | Admitting: Family Medicine

## 2018-08-23 NOTE — Telephone Encounter (Signed)
They will be going to pt's home 2.18.20 to start home health services. FYI

## 2018-08-24 ENCOUNTER — Telehealth: Payer: Self-pay | Admitting: Endocrinology

## 2018-08-24 DIAGNOSIS — I739 Peripheral vascular disease, unspecified: Secondary | ICD-10-CM

## 2018-08-24 NOTE — Telephone Encounter (Signed)
Patient called requesting information or results from her recent ultrasound

## 2018-08-24 NOTE — Telephone Encounter (Signed)
Noted  

## 2018-08-24 NOTE — Telephone Encounter (Signed)
Pt was provided results but questioned that they are normal. States tech informed her she has "2 blood clots" and "wants to know what is going to be done about them". Further added, "will I even be cleared to get diabetic shoes?"

## 2018-08-24 NOTE — Telephone Encounter (Signed)
I am happy to do the form for DM shoes.  As for the abnormal test results, I would be happy to refer you to a specialist.

## 2018-08-25 ENCOUNTER — Other Ambulatory Visit: Payer: Self-pay | Admitting: Cardiology

## 2018-08-27 ENCOUNTER — Ambulatory Visit: Payer: Medicare Other | Admitting: Podiatry

## 2018-08-27 DIAGNOSIS — I739 Peripheral vascular disease, unspecified: Secondary | ICD-10-CM | POA: Insufficient documentation

## 2018-08-27 NOTE — Telephone Encounter (Signed)
Ok, I placed referral 

## 2018-08-27 NOTE — Telephone Encounter (Signed)
And the referral?

## 2018-08-27 NOTE — Telephone Encounter (Signed)
Do you have this form for her shoes?  Please place referral and I will call her to make her aware. Thanks

## 2018-08-27 NOTE — Telephone Encounter (Signed)
I don't have the form, but if I get one, I would be happy to do.

## 2018-08-27 NOTE — Telephone Encounter (Signed)
Called pt and made her aware. Advised to have documentation for DM shoes be sent to our office for completion. Pt also aware of scheduling process for referral. Verbalized acceptance and understanding.

## 2018-08-28 DIAGNOSIS — Z8673 Personal history of transient ischemic attack (TIA), and cerebral infarction without residual deficits: Secondary | ICD-10-CM | POA: Diagnosis not present

## 2018-08-28 DIAGNOSIS — I5032 Chronic diastolic (congestive) heart failure: Secondary | ICD-10-CM | POA: Diagnosis not present

## 2018-08-28 DIAGNOSIS — E1142 Type 2 diabetes mellitus with diabetic polyneuropathy: Secondary | ICD-10-CM | POA: Diagnosis not present

## 2018-08-28 DIAGNOSIS — F319 Bipolar disorder, unspecified: Secondary | ICD-10-CM | POA: Diagnosis not present

## 2018-08-28 DIAGNOSIS — K219 Gastro-esophageal reflux disease without esophagitis: Secondary | ICD-10-CM | POA: Diagnosis not present

## 2018-08-28 DIAGNOSIS — F419 Anxiety disorder, unspecified: Secondary | ICD-10-CM | POA: Diagnosis not present

## 2018-08-28 DIAGNOSIS — F17210 Nicotine dependence, cigarettes, uncomplicated: Secondary | ICD-10-CM | POA: Diagnosis not present

## 2018-08-28 DIAGNOSIS — Z794 Long term (current) use of insulin: Secondary | ICD-10-CM | POA: Diagnosis not present

## 2018-08-28 DIAGNOSIS — N182 Chronic kidney disease, stage 2 (mild): Secondary | ICD-10-CM | POA: Diagnosis not present

## 2018-08-28 DIAGNOSIS — I2510 Atherosclerotic heart disease of native coronary artery without angina pectoris: Secondary | ICD-10-CM | POA: Diagnosis not present

## 2018-08-28 DIAGNOSIS — J449 Chronic obstructive pulmonary disease, unspecified: Secondary | ICD-10-CM | POA: Diagnosis not present

## 2018-08-28 DIAGNOSIS — I130 Hypertensive heart and chronic kidney disease with heart failure and stage 1 through stage 4 chronic kidney disease, or unspecified chronic kidney disease: Secondary | ICD-10-CM | POA: Diagnosis not present

## 2018-08-28 DIAGNOSIS — I4510 Unspecified right bundle-branch block: Secondary | ICD-10-CM | POA: Diagnosis not present

## 2018-08-28 DIAGNOSIS — E1122 Type 2 diabetes mellitus with diabetic chronic kidney disease: Secondary | ICD-10-CM | POA: Diagnosis not present

## 2018-08-28 DIAGNOSIS — Z9981 Dependence on supplemental oxygen: Secondary | ICD-10-CM | POA: Diagnosis not present

## 2018-08-30 ENCOUNTER — Ambulatory Visit: Payer: Medicare Other | Admitting: Podiatry

## 2018-08-30 DIAGNOSIS — Z8673 Personal history of transient ischemic attack (TIA), and cerebral infarction without residual deficits: Secondary | ICD-10-CM | POA: Diagnosis not present

## 2018-08-30 DIAGNOSIS — N182 Chronic kidney disease, stage 2 (mild): Secondary | ICD-10-CM | POA: Diagnosis not present

## 2018-08-30 DIAGNOSIS — I5032 Chronic diastolic (congestive) heart failure: Secondary | ICD-10-CM | POA: Diagnosis not present

## 2018-08-30 DIAGNOSIS — E1122 Type 2 diabetes mellitus with diabetic chronic kidney disease: Secondary | ICD-10-CM | POA: Diagnosis not present

## 2018-08-30 DIAGNOSIS — I130 Hypertensive heart and chronic kidney disease with heart failure and stage 1 through stage 4 chronic kidney disease, or unspecified chronic kidney disease: Secondary | ICD-10-CM | POA: Diagnosis not present

## 2018-08-30 DIAGNOSIS — I4510 Unspecified right bundle-branch block: Secondary | ICD-10-CM | POA: Diagnosis not present

## 2018-08-30 DIAGNOSIS — K219 Gastro-esophageal reflux disease without esophagitis: Secondary | ICD-10-CM | POA: Diagnosis not present

## 2018-08-30 DIAGNOSIS — E1142 Type 2 diabetes mellitus with diabetic polyneuropathy: Secondary | ICD-10-CM | POA: Diagnosis not present

## 2018-08-30 DIAGNOSIS — J449 Chronic obstructive pulmonary disease, unspecified: Secondary | ICD-10-CM | POA: Diagnosis not present

## 2018-08-30 DIAGNOSIS — I2510 Atherosclerotic heart disease of native coronary artery without angina pectoris: Secondary | ICD-10-CM | POA: Diagnosis not present

## 2018-08-30 DIAGNOSIS — F17210 Nicotine dependence, cigarettes, uncomplicated: Secondary | ICD-10-CM | POA: Diagnosis not present

## 2018-08-30 DIAGNOSIS — Z9981 Dependence on supplemental oxygen: Secondary | ICD-10-CM | POA: Diagnosis not present

## 2018-08-30 DIAGNOSIS — F319 Bipolar disorder, unspecified: Secondary | ICD-10-CM | POA: Diagnosis not present

## 2018-08-30 DIAGNOSIS — Z794 Long term (current) use of insulin: Secondary | ICD-10-CM | POA: Diagnosis not present

## 2018-08-30 DIAGNOSIS — F419 Anxiety disorder, unspecified: Secondary | ICD-10-CM | POA: Diagnosis not present

## 2018-09-04 DIAGNOSIS — E1142 Type 2 diabetes mellitus with diabetic polyneuropathy: Secondary | ICD-10-CM | POA: Diagnosis not present

## 2018-09-04 DIAGNOSIS — J449 Chronic obstructive pulmonary disease, unspecified: Secondary | ICD-10-CM | POA: Diagnosis not present

## 2018-09-04 DIAGNOSIS — F419 Anxiety disorder, unspecified: Secondary | ICD-10-CM | POA: Diagnosis not present

## 2018-09-04 DIAGNOSIS — K219 Gastro-esophageal reflux disease without esophagitis: Secondary | ICD-10-CM | POA: Diagnosis not present

## 2018-09-04 DIAGNOSIS — I130 Hypertensive heart and chronic kidney disease with heart failure and stage 1 through stage 4 chronic kidney disease, or unspecified chronic kidney disease: Secondary | ICD-10-CM | POA: Diagnosis not present

## 2018-09-04 DIAGNOSIS — I5032 Chronic diastolic (congestive) heart failure: Secondary | ICD-10-CM | POA: Diagnosis not present

## 2018-09-04 DIAGNOSIS — I2510 Atherosclerotic heart disease of native coronary artery without angina pectoris: Secondary | ICD-10-CM | POA: Diagnosis not present

## 2018-09-04 DIAGNOSIS — E1122 Type 2 diabetes mellitus with diabetic chronic kidney disease: Secondary | ICD-10-CM | POA: Diagnosis not present

## 2018-09-04 DIAGNOSIS — F17210 Nicotine dependence, cigarettes, uncomplicated: Secondary | ICD-10-CM | POA: Diagnosis not present

## 2018-09-04 DIAGNOSIS — Z9981 Dependence on supplemental oxygen: Secondary | ICD-10-CM | POA: Diagnosis not present

## 2018-09-04 DIAGNOSIS — Z8673 Personal history of transient ischemic attack (TIA), and cerebral infarction without residual deficits: Secondary | ICD-10-CM | POA: Diagnosis not present

## 2018-09-04 DIAGNOSIS — N182 Chronic kidney disease, stage 2 (mild): Secondary | ICD-10-CM | POA: Diagnosis not present

## 2018-09-04 DIAGNOSIS — F319 Bipolar disorder, unspecified: Secondary | ICD-10-CM | POA: Diagnosis not present

## 2018-09-04 DIAGNOSIS — I4510 Unspecified right bundle-branch block: Secondary | ICD-10-CM | POA: Diagnosis not present

## 2018-09-04 DIAGNOSIS — Z794 Long term (current) use of insulin: Secondary | ICD-10-CM | POA: Diagnosis not present

## 2018-09-05 ENCOUNTER — Ambulatory Visit (INDEPENDENT_AMBULATORY_CARE_PROVIDER_SITE_OTHER): Payer: BLUE CROSS/BLUE SHIELD | Admitting: Primary Care

## 2018-09-05 ENCOUNTER — Encounter: Payer: Self-pay | Admitting: Primary Care

## 2018-09-05 ENCOUNTER — Ambulatory Visit (INDEPENDENT_AMBULATORY_CARE_PROVIDER_SITE_OTHER)
Admission: RE | Admit: 2018-09-05 | Discharge: 2018-09-05 | Disposition: A | Discharge status: Home / Self Care | Payer: BLUE CROSS/BLUE SHIELD | Source: Ambulatory Visit | Attending: Primary Care | Admitting: Primary Care

## 2018-09-05 VITALS — BP 122/72 | HR 75 | Temp 98.0°F | Ht 64.0 in | Wt 289.2 lb

## 2018-09-05 DIAGNOSIS — F319 Bipolar disorder, unspecified: Secondary | ICD-10-CM

## 2018-09-05 DIAGNOSIS — M545 Low back pain: Secondary | ICD-10-CM | POA: Diagnosis not present

## 2018-09-05 DIAGNOSIS — M5441 Lumbago with sciatica, right side: Secondary | ICD-10-CM

## 2018-09-05 DIAGNOSIS — N182 Chronic kidney disease, stage 2 (mild): Secondary | ICD-10-CM

## 2018-09-05 DIAGNOSIS — R296 Repeated falls: Secondary | ICD-10-CM | POA: Diagnosis not present

## 2018-09-05 DIAGNOSIS — Z794 Long term (current) use of insulin: Secondary | ICD-10-CM

## 2018-09-05 DIAGNOSIS — F419 Anxiety disorder, unspecified: Secondary | ICD-10-CM

## 2018-09-05 DIAGNOSIS — I509 Heart failure, unspecified: Secondary | ICD-10-CM

## 2018-09-05 DIAGNOSIS — Z8673 Personal history of transient ischemic attack (TIA), and cerebral infarction without residual deficits: Secondary | ICD-10-CM

## 2018-09-05 DIAGNOSIS — R29898 Other symptoms and signs involving the musculoskeletal system: Secondary | ICD-10-CM | POA: Insufficient documentation

## 2018-09-05 DIAGNOSIS — E1142 Type 2 diabetes mellitus with diabetic polyneuropathy: Secondary | ICD-10-CM | POA: Diagnosis not present

## 2018-09-05 DIAGNOSIS — G8929 Other chronic pain: Secondary | ICD-10-CM | POA: Diagnosis not present

## 2018-09-05 DIAGNOSIS — I5032 Chronic diastolic (congestive) heart failure: Secondary | ICD-10-CM | POA: Diagnosis not present

## 2018-09-05 DIAGNOSIS — Z9981 Dependence on supplemental oxygen: Secondary | ICD-10-CM

## 2018-09-05 DIAGNOSIS — E1122 Type 2 diabetes mellitus with diabetic chronic kidney disease: Secondary | ICD-10-CM

## 2018-09-05 DIAGNOSIS — M5442 Lumbago with sciatica, left side: Secondary | ICD-10-CM

## 2018-09-05 DIAGNOSIS — K219 Gastro-esophageal reflux disease without esophagitis: Secondary | ICD-10-CM

## 2018-09-05 DIAGNOSIS — F17210 Nicotine dependence, cigarettes, uncomplicated: Secondary | ICD-10-CM

## 2018-09-05 DIAGNOSIS — I130 Hypertensive heart and chronic kidney disease with heart failure and stage 1 through stage 4 chronic kidney disease, or unspecified chronic kidney disease: Secondary | ICD-10-CM | POA: Diagnosis not present

## 2018-09-05 DIAGNOSIS — J449 Chronic obstructive pulmonary disease, unspecified: Secondary | ICD-10-CM | POA: Diagnosis not present

## 2018-09-05 DIAGNOSIS — I2510 Atherosclerotic heart disease of native coronary artery without angina pectoris: Secondary | ICD-10-CM

## 2018-09-05 DIAGNOSIS — I4510 Unspecified right bundle-branch block: Secondary | ICD-10-CM

## 2018-09-05 HISTORY — DX: Repeated falls: R29.6
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
## 2018-09-05 NOTE — Patient Instructions (Addendum)
Complete xray(s) prior to leaving today. I will notify you of your results once received.  You will be contacted regarding your referral to neurosurgery for evaluation of your left leg weakness.  Please let us know if you have not been contacted within one week.   You will be contacted regarding the mechanical lift recliner soon.  It was a pleasure to see you today!

## 2018-09-05 NOTE — Assessment & Plan Note (Signed)
Chronic.  Referral placed to neurosurgery.

## 2018-09-05 NOTE — Progress Notes (Signed)
Subjective:    Patient ID: [name], female    DOB: Sep 03, 1970, 48 y.o.   MRN: [id number]  HPI  [name] is a 48 year old female with a significant medical history including uncontrolled diabetes, tobacco abuse, seizures, venous stasis, PAD, COPD, CHF, TIA, sleep apnea who presents today with a chief complaint of left lower extremity weakness and recurrent falls.   She endorses falling at 2-3 times daily on average for the last 6 months since her left groin surgery for abscess. She falls as her left lower extremity will "give out". She will walk with her walker, the left leg will "give out" and she will fall to the floor. She is active with home physical therapy twice weekly for the last three weeks and was told that she needed further evaluation.   She endorses numbness/tingling that begins to the left groin and radiates down to her left lower extremity. She underwent xray of her lumbar spine in July 2019 per pain management, no appreciable disc space narrowing or changes in alignment. Does have osteoarthritic changes. She denies changes in bowel/bladder control. She does have chronic bilateral lower back pain.   She is requesting a reclining, mechanical lift chair as she cannot get up by herself due to chronic left lower extremity weakness. Her family has to pull her out of the recliner for which she also needs for elevation of lower extremities due to CHF. She has fallen when trying to get up from her current recliner. She is actively working with home health physical therapy and has not progressed.   Review of Systems  Musculoskeletal: Positive for back pain.  Neurological: Positive for weakness and numbness.       Past Medical History:  Diagnosis Date  . Anxiety    takes Prozac daily  . Anxiety   . Aortic valve calcification   . Asthma    Advair and Spirva daily  . Asthma   . Bipolar disorder ([place])   . CAD (coronary artery disease)    a. LHC 11/2013 done for CP/fluid  retention: mild disease in prox LAD, mild-mod disease in mRCA, EF 60% with normal LVEDP. b. Normal nuc 03/2016.  Marland Kitchen CHF (congestive heart failure) ([place])   . Chronic diastolic CHF (congestive heart failure) ([place])   . CKD (chronic kidney disease), stage II   . COPD (chronic obstructive pulmonary disease) ([facility])    a. nocturnal O2.  Marland Kitchen COPD (chronic obstructive pulmonary disease) ([place])   . Coronary artery disease   . Decreased urine stream   . Diabetes mellitus   . Diabetes mellitus without complication ([place])   . Dyspnea   . Family history of adverse reaction to anesthesia    mom gets nauseated  . GERD (gastroesophageal reflux disease)    takes Pepcid daily  . History of blood clots    left leg 3-38yrs ago  . Hyperlipidemia   . Hypertension   . Hypertriglyceridemia   . Inguinal hernia, left 01/2015  . Muscle spasm   . Open wound of genital labia   . Peripheral neuropathy   . RBBB   . Seizures ([place])   . Sepsis ([place]) 01/19/2018  . Sinus tachycardia    a. persistent since 2009.  Marland Kitchen Smokers' cough ([place])   . Stroke [facility]) 1989   left sided weakness  . TIA (transient ischemic attack)   . Tobacco abuse   . Vulvar abscess 01/23/2018     Social History   Socioeconomic History  .  Marital status: Married    Spouse name: Not on file  . Number of children: Not on file  . Years of education: Not on file  . Highest education level: Not on file  Occupational History  . Not on file  Social Needs  . Financial resource strain: Not on file  . Food insecurity:    Worry: Not on file    Inability: Not on file  . Transportation needs:    Medical: Not on file    Non-medical: Not on file  Tobacco Use  . Smoking status: Current Every Day Smoker    Packs/day: 1.00    Years: 36.00    Pack years: 36.00    Types: Cigarettes    Last attempt to quit: 01/18/2018    Years since quitting: 0.6  . Smokeless tobacco: Never Used  Substance and Sexual Activity  . Alcohol use: No    Frequency: Never  .  Drug use: No  . Sexual activity: Not Currently  Lifestyle  . Physical activity:    Days per week: Not on file    Minutes per session: Not on file  . Stress: Not on file  Relationships  . Social connections:    Talks on phone: Not on file    Gets together: Not on file    Attends religious service: Not on file    Active member of club or organization: Not on file    Attends meetings of clubs or organizations: Not on file    Relationship status: Not on file  . Intimate partner violence:    Fear of current or ex partner: Not on file    Emotionally abused: Not on file    Physically abused: Not on file    Forced sexual activity: Not on file  Other Topics Concern  . Not on file  Social History Narrative   ** Merged History Encounter **       Lives in [place]   Married but lives with Boyfriend - not legally separated   Disabled - for BiPolar, Seizure disorder, diabetes   Formerly worked at [employer]          Past Surgical History:  Procedure Laterality Date  . HERNIA REPAIR    . INCISION AND DRAINAGE ABSCESS N/A 01/26/2018   Procedure: INCISION AND DEBRIDEMENT OF VULVAR NECROTIZING SOFT TISSUE INFECTION;  Surgeon: Greer Pickerel, MD;  Location: [place];  Service: General;  Laterality: N/A;  . INCISION AND DRAINAGE PERIRECTAL ABSCESS N/A 01/22/2018   Procedure: IRRIGATION AND DEBRIDEMENT LABIAL/VULVAR AREA;  Surgeon: Coralie Keens, MD;  Location: [place];  Service: General;  Laterality: N/A;  . INCISION AND DRAINAGE PERIRECTAL ABSCESS N/A 01/29/2018   Procedure: IRRIGATION AND DEBRIDEMENT VULVA;  Surgeon: Excell Seltzer, MD;  Location: [place];  Service: General;  Laterality: N/A;  . INGUINAL HERNIA REPAIR Left 04/08/2015   Procedure: OPEN LEFT INGUINAL HERNIA REPAIR WITH MESH;  Surgeon: Ralene Ok, MD;  Location: [place];  Service: General;  Laterality: Left;  . INSERTION OF MESH Left 04/08/2015   Procedure: INSERTION OF MESH;  Surgeon: Ralene Ok, MD;  Location: [place];   Service: General;  Laterality: Left;  . LAPAROSCOPY     Endometriosis  . LEFT HEART CATHETERIZATION WITH CORONARY ANGIOGRAM N/A 12/05/2013   Procedure: LEFT HEART CATHETERIZATION WITH CORONARY ANGIOGRAM;  Surgeon: Jettie Booze, MD;  Location[facility] CATH LAB;  Service: Cardiovascular;  Laterality: N/A;  . right kidney drained    . TEE WITHOUT CARDIOVERSION N/A 11/28/2013  Procedure: TRANSESOPHAGEAL ECHOCARDIOGRAM (TEE);  Surgeon: Thayer Headings, MD;  [facility] ENDOSCOPY;  Service: Cardiovascular;  Laterality: N/A;    Family History  Problem Relation Age of Onset  . Venous thrombosis Brother   . Other Brother        BRAIN TUMOR  . Asthma Father   . Diabetes Father   . Coronary artery disease Mother   . Hypertension Mother   . Diabetes Mother   . Asthma Sister   . Diabetes type II Brother     Allergies  Allergen Reactions  . Adhesive [Tape] Rash and Other (See Comments)    TAKES OFF THE SKIN (CERTAIN MEDICAL TAPES DO THIS!!)  . Metoprolol Shortness Of Breath    Occurrence of shortness of breath after 3 days  . Montelukast Shortness Of Breath  . Morphine Sulfate Anaphylaxis, Shortness Of Breath and Nausea And Vomiting    Swollen Throat - Able to tolerate dilaudid  . Penicillins Anaphylaxis, Hives and Shortness Of Breath    Throat swells Has patient had a PCN reaction causing immediate rash, facial/tongue/throat swelling, SOB or lightheadedness with hypotension: Yes Has patient had a PCN reaction causing severe rash involving mucus membranes or skin necrosis: No Has patient had a PCN reaction that required hospitalization: Yes Has patient had a PCN reaction occurring within the last 10 years: No If all of the above answers are "NO", then may proceed with Cephalosporin use.   . Prednisone Anaphylaxis  . Diltiazem Swelling  . Gabapentin Swelling    Current Outpatient Medications on File Prior to Visit  Medication Sig Dispense Refill  . acetaminophen (TYLENOL) 500 MG  tablet Take 500 mg by mouth every 4 (four) hours as needed.    Marland Kitchen albuterol (PROVENTIL HFA;VENTOLIN HFA) 108 (90 Base) MCG/ACT inhaler INHALE 2 PUFFS INTO LUNGS EVERY 4 HOURS AS NEEDED FOR SHORTNESS OF BREATH 6.7 g 0  . ALPRAZolam (XANAX) 1 MG tablet Take 1 mg by mouth 3 (three) times daily as needed for anxiety.    Marland Kitchen aspirin EC 81 MG tablet Take 81 mg by mouth every morning.    . budesonide (PULMICORT) 0.5 MG/2ML nebulizer solution Take 0.5 mg by nebulization 2 (two) times daily.    . budesonide-formoterol (SYMBICORT) 80-4.5 MCG/ACT inhaler INHALE 2 PUFFS BY MOUTH EVERY 12 HOURS TO PREVENT COUGH OR WHEEZING *RINSE MOUTH AFTER USING* 10.2 g 2  . busPIRone (BUSPAR) 30 MG tablet Take 30 mg by mouth 2 (two) times daily.    . famotidine (PEPCID) 20 MG tablet TAKE 1 TABLET BY MOUTH TWICE (2) DAILY FOR ACID INDIGESTION 180 tablet 3  . FLUoxetine (PROZAC) 40 MG capsule     . glucose blood (ONE TOUCH ULTRA TEST) test strip Used to check blood sugars 4x daily. 200 each 4  . Insulin Pen Needle 32G X 4 MM MISC Used to give insulin injections twice daily. 100 each 11  . insulin regular human CONCENTRATED (HUMULIN R U-500 KWIKPEN) 500 UNIT/ML kwikpen Inject 150 Units into the skin 2 (two) times daily with a meal. 12 pen 11  . ipratropium-albuterol (DUONEB) 0.5-2.5 (3) MG/3ML SOLN Take 3 mLs by nebulization every 4 (four) hours as needed (wheezing/shortness of breath). 360 mL 0  . loperamide (IMODIUM A-D) 2 MG tablet Take 4 mg by mouth 3 (three) times daily as needed for diarrhea or loose stools.    . metFORMIN (GLUCOPHAGE) 1000 MG tablet Take 1,000 mg by mouth 2 (two) times daily with a meal.    .  metolazone (ZAROXOLYN) 2.5 MG tablet Take 2.5 mg by mouth every other day. Take 2.5 mg by mouth every 48 hours on Tuesday, Thursday and Saturday    . nystatin (MYCOSTATIN) 100000 UNIT/ML suspension Swish, gargle and swallow 5 mls in the mouth four times daily. 140 mL 0  . OXYGEN Inhale 4 L into the lungs at bedtime.       . pregabalin (LYRICA) 300 MG capsule Take 1 capsule (300 mg total) by mouth 3 (three) times daily. For neuropathy 270 capsule 0  . PRESCRIPTION MEDICATION Place 1 drop into both eyes daily as needed (every 14 weeks before injections).    . ranitidine (ZANTAC) 150 MG tablet Take 150 mg by mouth 2 (two) times daily.     Marland Kitchen rOPINIRole (REQUIP) 1 MG tablet Take 1 tablet (1 mg total) by mouth at bedtime. For restless legs. 90 tablet 1  . spironolactone (ALDACTONE) 50 MG tablet TAKE 1 TABLET BY MOUTH ONCE DAILY 30 tablet 3  . torsemide (DEMADEX) 10 MG tablet Take 3 tablets by mouth once daily for swelling and fluid retention. (Patient taking differently: Take 20-40 mg by mouth 2 (two) times daily. 40 mg in the morning and 20 mg in the evening) 90 tablet 0  . traZODone (DESYREL) 50 MG tablet Take 50-150 mg by mouth at bedtime.    Marland Kitchen ULTICARE MINI PEN NEEDLES 31G X 6 MM MISC USE AS DIRECTED THREE TIMES DAILY 100 each 0  . linaclotide (LINZESS) 290 MCG CAPS capsule Take 1 capsule (290 mcg total) by mouth daily before breakfast. 90 capsule 0   No current facility-administered medications on file prior to visit.     BP 122/72   Pulse 75   Temp 98 F (36.7 C) (Oral)   Ht 5\' 4"  (1.626 m)   Wt 289 lb 4 oz (131.2 kg)   LMP 11/11/2011   SpO2 97%   BMI 49.65 kg/m    Objective:   Physical Exam  Constitutional: She appears well-nourished.  Neck: Neck supple.  Cardiovascular: Normal rate.  Respiratory: Effort normal.  Musculoskeletal:     Comments: Left lower extremity weak upon standing, also when lifting from a seated position. Equal strength bilaterally when pushing against force. In wheelchair today.  Skin: Skin is warm and dry.           Assessment & Plan:

## 2018-09-05 NOTE — Assessment & Plan Note (Signed)
Recurrent over the last 6 months, working with physical therapy. Will have her evaluated by neurosurgery given progressive weakness.  Agree that she will need a mechanical recliner lift chair given weakness, recurrent falls, and elevation for CHF. Community message sent to [facility], order placed in Epic.

## 2018-09-06 ENCOUNTER — Telehealth: Payer: Self-pay | Admitting: Primary Care

## 2018-09-06 NOTE — Telephone Encounter (Signed)
I have send a message to [facility] regarding patient.

## 2018-09-06 NOTE — Telephone Encounter (Signed)
Pt called [facility] concerning lift chair but was told they do not take [employer]. She is wondering what to do from here. I advised her to call around to home health agencies to see what they can offer and/or call Medicare to see what they advise and she said she would do that but she also requested a call back from [place].

## 2018-09-07 NOTE — Telephone Encounter (Signed)
Pt waiting on call back from nurse.

## 2018-09-07 NOTE — Telephone Encounter (Signed)
Pt returning your call

## 2018-09-07 NOTE — Telephone Encounter (Signed)
Spoken to patient on 09/06/2018. Inform her that her insurance is not going to pay 100% on the left chair per [facility]. Advise patient that she may contact medicare if they can help. Patient verbalized understanding.

## 2018-09-07 NOTE — Telephone Encounter (Signed)
Spoken and notified patient of [name] Clark's comments. Patient verbalized understanding.  

## 2018-09-10 ENCOUNTER — Ambulatory Visit: Payer: Medicare Other | Admitting: Podiatry

## 2018-09-11 ENCOUNTER — Telehealth: Payer: Self-pay | Admitting: Endocrinology

## 2018-09-11 DIAGNOSIS — Z794 Long term (current) use of insulin: Secondary | ICD-10-CM | POA: Diagnosis not present

## 2018-09-11 DIAGNOSIS — Z9981 Dependence on supplemental oxygen: Secondary | ICD-10-CM | POA: Diagnosis not present

## 2018-09-11 DIAGNOSIS — I130 Hypertensive heart and chronic kidney disease with heart failure and stage 1 through stage 4 chronic kidney disease, or unspecified chronic kidney disease: Secondary | ICD-10-CM | POA: Diagnosis not present

## 2018-09-11 DIAGNOSIS — N182 Chronic kidney disease, stage 2 (mild): Secondary | ICD-10-CM | POA: Diagnosis not present

## 2018-09-11 DIAGNOSIS — I5032 Chronic diastolic (congestive) heart failure: Secondary | ICD-10-CM | POA: Diagnosis not present

## 2018-09-11 DIAGNOSIS — F419 Anxiety disorder, unspecified: Secondary | ICD-10-CM | POA: Diagnosis not present

## 2018-09-11 DIAGNOSIS — E1122 Type 2 diabetes mellitus with diabetic chronic kidney disease: Secondary | ICD-10-CM | POA: Diagnosis not present

## 2018-09-11 DIAGNOSIS — J449 Chronic obstructive pulmonary disease, unspecified: Secondary | ICD-10-CM | POA: Diagnosis not present

## 2018-09-11 DIAGNOSIS — I2510 Atherosclerotic heart disease of native coronary artery without angina pectoris: Secondary | ICD-10-CM | POA: Diagnosis not present

## 2018-09-11 DIAGNOSIS — K219 Gastro-esophageal reflux disease without esophagitis: Secondary | ICD-10-CM | POA: Diagnosis not present

## 2018-09-11 DIAGNOSIS — F319 Bipolar disorder, unspecified: Secondary | ICD-10-CM | POA: Diagnosis not present

## 2018-09-11 DIAGNOSIS — E1142 Type 2 diabetes mellitus with diabetic polyneuropathy: Secondary | ICD-10-CM | POA: Diagnosis not present

## 2018-09-11 DIAGNOSIS — F17210 Nicotine dependence, cigarettes, uncomplicated: Secondary | ICD-10-CM | POA: Diagnosis not present

## 2018-09-11 DIAGNOSIS — Z8673 Personal history of transient ischemic attack (TIA), and cerebral infarction without residual deficits: Secondary | ICD-10-CM | POA: Diagnosis not present

## 2018-09-11 DIAGNOSIS — I4510 Unspecified right bundle-branch block: Secondary | ICD-10-CM | POA: Diagnosis not present

## 2018-09-11 NOTE — Telephone Encounter (Signed)
Patient requests to be called at ph# [phone] re: status of diabetic shoes form

## 2018-09-11 NOTE — Telephone Encounter (Signed)
Returned pt call. Informed documents have been completed and faxed on 08/30/18 @ 315pm. Pt insisted office is informing her that they have not received the documents. Advised she is welcome to stop by the office to pick up the documents to take with her to her appt. Verbalized acceptance and understanding.

## 2018-09-12 ENCOUNTER — Telehealth: Payer: Self-pay

## 2018-09-12 NOTE — Telephone Encounter (Signed)
Patients nurse Olivia Mackie called stating she would like a call back due to the patient not being able to drive today and providing fax information to the office. Please Advise, thanks  Documents placed in the box.

## 2018-09-12 NOTE — Telephone Encounter (Signed)
Currently unable to return this call, as Olivia Mackie, the nurse, did not leave a call back number.

## 2018-09-12 NOTE — Telephone Encounter (Signed)
Okay with me.  Thanks MJP  

## 2018-09-12 NOTE — Telephone Encounter (Signed)
Home health nurse called saying pt weight is 297 today Edema in bilateral lower extremity She is on torsemide 20mg  2 in am and 2 in pm  and spironolactone  She wants to know if we can increase the torsemide Some sob, lungs sound good; Also the abd is tender too touch too she might have some fluid there as well

## 2018-09-13 ENCOUNTER — Observation Stay (HOSPITAL_COMMUNITY): Payer: BLUE CROSS/BLUE SHIELD

## 2018-09-13 ENCOUNTER — Observation Stay (HOSPITAL_COMMUNITY)
Admission: EM | Admit: 2018-09-13 | Discharge: 2018-09-14 | Disposition: A | Discharge status: Home / Self Care | Payer: BLUE CROSS/BLUE SHIELD | Attending: Internal Medicine | Admitting: Internal Medicine

## 2018-09-13 ENCOUNTER — Emergency Department (HOSPITAL_COMMUNITY): Payer: BLUE CROSS/BLUE SHIELD

## 2018-09-13 ENCOUNTER — Encounter (HOSPITAL_COMMUNITY): Payer: Self-pay | Admitting: Emergency Medicine

## 2018-09-13 ENCOUNTER — Ambulatory Visit: Payer: Medicare Other | Admitting: Podiatry

## 2018-09-13 ENCOUNTER — Other Ambulatory Visit: Payer: Self-pay

## 2018-09-13 DIAGNOSIS — R51 Headache: Secondary | ICD-10-CM | POA: Diagnosis not present

## 2018-09-13 DIAGNOSIS — F419 Anxiety disorder, unspecified: Secondary | ICD-10-CM | POA: Insufficient documentation

## 2018-09-13 DIAGNOSIS — R4781 Slurred speech: Secondary | ICD-10-CM

## 2018-09-13 DIAGNOSIS — R918 Other nonspecific abnormal finding of lung field: Secondary | ICD-10-CM | POA: Diagnosis not present

## 2018-09-13 DIAGNOSIS — R531 Weakness: Secondary | ICD-10-CM

## 2018-09-13 DIAGNOSIS — G40909 Epilepsy, unspecified, not intractable, without status epilepticus: Secondary | ICD-10-CM | POA: Diagnosis not present

## 2018-09-13 DIAGNOSIS — J9611 Chronic respiratory failure with hypoxia: Secondary | ICD-10-CM | POA: Diagnosis not present

## 2018-09-13 DIAGNOSIS — N182 Chronic kidney disease, stage 2 (mild): Secondary | ICD-10-CM | POA: Diagnosis not present

## 2018-09-13 DIAGNOSIS — I452 Bifascicular block: Secondary | ICD-10-CM | POA: Diagnosis not present

## 2018-09-13 DIAGNOSIS — F17210 Nicotine dependence, cigarettes, uncomplicated: Secondary | ICD-10-CM | POA: Diagnosis not present

## 2018-09-13 DIAGNOSIS — Z8249 Family history of ischemic heart disease and other diseases of the circulatory system: Secondary | ICD-10-CM | POA: Insufficient documentation

## 2018-09-13 DIAGNOSIS — I5032 Chronic diastolic (congestive) heart failure: Secondary | ICD-10-CM | POA: Diagnosis not present

## 2018-09-13 DIAGNOSIS — E1165 Type 2 diabetes mellitus with hyperglycemia: Secondary | ICD-10-CM | POA: Diagnosis present

## 2018-09-13 DIAGNOSIS — I4510 Unspecified right bundle-branch block: Secondary | ICD-10-CM | POA: Diagnosis not present

## 2018-09-13 DIAGNOSIS — E785 Hyperlipidemia, unspecified: Secondary | ICD-10-CM | POA: Insufficient documentation

## 2018-09-13 DIAGNOSIS — E1122 Type 2 diabetes mellitus with diabetic chronic kidney disease: Secondary | ICD-10-CM | POA: Diagnosis not present

## 2018-09-13 DIAGNOSIS — F17200 Nicotine dependence, unspecified, uncomplicated: Secondary | ICD-10-CM | POA: Diagnosis present

## 2018-09-13 DIAGNOSIS — Z6841 Body Mass Index (BMI) 40.0 and over, adult: Secondary | ICD-10-CM | POA: Insufficient documentation

## 2018-09-13 DIAGNOSIS — N189 Chronic kidney disease, unspecified: Secondary | ICD-10-CM | POA: Insufficient documentation

## 2018-09-13 DIAGNOSIS — R52 Pain, unspecified: Secondary | ICD-10-CM | POA: Diagnosis not present

## 2018-09-13 DIAGNOSIS — M6281 Muscle weakness (generalized): Secondary | ICD-10-CM | POA: Diagnosis not present

## 2018-09-13 DIAGNOSIS — Z7951 Long term (current) use of inhaled steroids: Secondary | ICD-10-CM | POA: Diagnosis not present

## 2018-09-13 DIAGNOSIS — J449 Chronic obstructive pulmonary disease, unspecified: Secondary | ICD-10-CM | POA: Diagnosis not present

## 2018-09-13 DIAGNOSIS — R29898 Other symptoms and signs involving the musculoskeletal system: Secondary | ICD-10-CM

## 2018-09-13 DIAGNOSIS — Z86718 Personal history of other venous thrombosis and embolism: Secondary | ICD-10-CM | POA: Insufficient documentation

## 2018-09-13 DIAGNOSIS — E1042 Type 1 diabetes mellitus with diabetic polyneuropathy: Secondary | ICD-10-CM | POA: Diagnosis present

## 2018-09-13 DIAGNOSIS — I130 Hypertensive heart and chronic kidney disease with heart failure and stage 1 through stage 4 chronic kidney disease, or unspecified chronic kidney disease: Secondary | ICD-10-CM | POA: Diagnosis not present

## 2018-09-13 DIAGNOSIS — Z7982 Long term (current) use of aspirin: Secondary | ICD-10-CM | POA: Diagnosis not present

## 2018-09-13 DIAGNOSIS — I2510 Atherosclerotic heart disease of native coronary artery without angina pectoris: Secondary | ICD-10-CM | POA: Insufficient documentation

## 2018-09-13 DIAGNOSIS — G9340 Encephalopathy, unspecified: Secondary | ICD-10-CM

## 2018-09-13 DIAGNOSIS — Z79899 Other long term (current) drug therapy: Secondary | ICD-10-CM | POA: Insufficient documentation

## 2018-09-13 DIAGNOSIS — G894 Chronic pain syndrome: Secondary | ICD-10-CM | POA: Diagnosis present

## 2018-09-13 DIAGNOSIS — G459 Transient cerebral ischemic attack, unspecified: Secondary | ICD-10-CM | POA: Diagnosis not present

## 2018-09-13 DIAGNOSIS — E781 Pure hyperglyceridemia: Secondary | ICD-10-CM | POA: Insufficient documentation

## 2018-09-13 DIAGNOSIS — E119 Type 2 diabetes mellitus without complications: Secondary | ICD-10-CM | POA: Diagnosis present

## 2018-09-13 DIAGNOSIS — Z720 Tobacco use: Secondary | ICD-10-CM | POA: Diagnosis present

## 2018-09-13 DIAGNOSIS — I152 Hypertension secondary to endocrine disorders: Secondary | ICD-10-CM | POA: Diagnosis present

## 2018-09-13 DIAGNOSIS — E1159 Type 2 diabetes mellitus with other circulatory complications: Secondary | ICD-10-CM | POA: Diagnosis present

## 2018-09-13 DIAGNOSIS — Z794 Long term (current) use of insulin: Secondary | ICD-10-CM | POA: Insufficient documentation

## 2018-09-13 DIAGNOSIS — F319 Bipolar disorder, unspecified: Secondary | ICD-10-CM | POA: Diagnosis present

## 2018-09-13 DIAGNOSIS — K219 Gastro-esophageal reflux disease without esophagitis: Secondary | ICD-10-CM | POA: Insufficient documentation

## 2018-09-13 DIAGNOSIS — I10 Essential (primary) hypertension: Secondary | ICD-10-CM | POA: Diagnosis present

## 2018-09-13 DIAGNOSIS — I959 Hypotension, unspecified: Secondary | ICD-10-CM | POA: Diagnosis not present

## 2018-09-13 DIAGNOSIS — G9341 Metabolic encephalopathy: Secondary | ICD-10-CM

## 2018-09-13 DIAGNOSIS — Z79891 Long term (current) use of opiate analgesic: Secondary | ICD-10-CM | POA: Diagnosis not present

## 2018-09-13 DIAGNOSIS — E1142 Type 2 diabetes mellitus with diabetic polyneuropathy: Secondary | ICD-10-CM | POA: Diagnosis present

## 2018-09-13 LAB — COMPREHENSIVE METABOLIC PANEL
ALT: 18 U/L (ref 0–44)
AST: 21 U/L (ref 15–41)
Albumin: 3.4 g/dL — ABNORMAL LOW (ref 3.5–5.0)
Alkaline Phosphatase: 103 U/L (ref 38–126)
Anion gap: 10 (ref 5–15)
BUN: 23 mg/dL — ABNORMAL HIGH (ref 6–20)
CO2: 25 mmol/L (ref 22–32)
Calcium: 8.9 mg/dL (ref 8.9–10.3)
Chloride: 99 mmol/L (ref 98–111)
Creatinine, Ser: 1.19 mg/dL — ABNORMAL HIGH (ref 0.44–1.00)
GFR calc Af Amer: >60 mL/min (ref >60)
GFR calc non Af Amer: 54 mL/min — ABNORMAL LOW (ref >60)
Glucose, Bld: 159 mg/dL — ABNORMAL HIGH (ref 70–99)
Potassium: 3.6 mmol/L (ref 3.5–5.1)
Sodium: 134 mmol/L — ABNORMAL LOW (ref 135–145)
Total Bilirubin: 1.5 mg/dL — ABNORMAL HIGH (ref 0.3–1.2)
Total Protein: 8.4 g/dL — ABNORMAL HIGH (ref 6.5–8.1)

## 2018-09-13 LAB — DIFFERENTIAL
Abs Immature Granulocytes: 0.08 10*3/uL — ABNORMAL HIGH (ref 0.00–0.07)
Basophils Absolute: 0.1 10*3/uL (ref 0.0–0.1)
Basophils Relative: 1 %
Eosinophils Absolute: 0.2 10*3/uL (ref 0.0–0.5)
Eosinophils Relative: 1 %
Immature Granulocytes: 1 %
Lymphocytes Relative: 10 %
Lymphs Abs: 1.2 10*3/uL (ref 0.7–4.0)
Monocytes Absolute: 0.7 10*3/uL (ref 0.1–1.0)
Monocytes Relative: 5 %
Neutro Abs: 10.3 10*3/uL — ABNORMAL HIGH (ref 1.7–7.7)
Neutrophils Relative %: 82 %

## 2018-09-13 LAB — POCT I-STAT 7, (LYTES, BLD GAS, ICA,H+H)
Acid-Base Excess: 4 mmol/L — ABNORMAL HIGH (ref 0.0–2.0)
Bicarbonate: 28.4 mmol/L — ABNORMAL HIGH (ref 20.0–28.0)
Calcium, Ion: 1.18 mmol/L (ref 1.15–1.40)
HCT: 32 % — ABNORMAL LOW (ref 36.0–46.0)
Hemoglobin: 10.9 g/dL — ABNORMAL LOW (ref 12.0–15.0)
O2 Saturation: 90 %
Patient temperature: 97.6
Potassium: 3.6 mmol/L (ref 3.5–5.1)
Sodium: 136 mmol/L (ref 135–145)
TCO2: 30 mmol/L (ref 22–32)
pCO2 arterial: 42.1 mmHg (ref 32.0–48.0)
pH, Arterial: 7.435 (ref 7.350–7.450)
pO2, Arterial: 55 mmHg — ABNORMAL LOW (ref 83.0–108.0)

## 2018-09-13 LAB — CBC
HCT: 36.2 % (ref 36.0–46.0)
Hemoglobin: 10.6 g/dL — ABNORMAL LOW (ref 12.0–15.0)
MCH: 23 pg — ABNORMAL LOW (ref 26.0–34.0)
MCHC: 29.3 g/dL — ABNORMAL LOW (ref 30.0–36.0)
MCV: 78.5 fL — ABNORMAL LOW (ref 80.0–100.0)
Platelets: 200 10*3/uL (ref 150–400)
RBC: 4.61 MIL/uL (ref 3.87–5.11)
RDW: 21.9 % — ABNORMAL HIGH (ref 11.5–15.5)
WBC: 12.4 10*3/uL — ABNORMAL HIGH (ref 4.0–10.5)
nRBC: 0 % (ref 0.0–0.2)

## 2018-09-13 LAB — ETHANOL: Alcohol, Ethyl (B): <10 mg/dL (ref <10)

## 2018-09-13 LAB — PROTIME-INR
INR: 1.2 (ref 0.8–1.2)
Prothrombin Time: 15.2 seconds (ref 11.4–15.2)

## 2018-09-13 LAB — I-STAT BETA HCG BLOOD, ED (MC, WL, AP ONLY): I-stat hCG, quantitative: 5.6 m[IU]/mL — ABNORMAL HIGH (ref <5)

## 2018-09-13 LAB — GLUCOSE, CAPILLARY
Glucose-Capillary: 134 mg/dL — ABNORMAL HIGH (ref 70–99)
Glucose-Capillary: 217 mg/dL — ABNORMAL HIGH (ref 70–99)

## 2018-09-13 LAB — APTT: aPTT: 29 seconds (ref 24–36)

## 2018-09-13 MED ORDER — LOPERAMIDE HCL 2 MG PO CAPS: 4.0000 mg | ORAL_CAPSULE | Freq: Three times a day (TID) | ORAL | Status: DC | PRN

## 2018-09-13 MED ORDER — DIPHENHYDRAMINE HCL 50 MG/ML IJ SOLN
12.5000 mg | Freq: Once | INTRAMUSCULAR | Status: AC
  Administered 2018-09-13: 12.5 mg via INTRAVENOUS
  Filled 2018-09-13: qty 1

## 2018-09-13 MED ORDER — FAMOTIDINE 20 MG PO TABS
20.0000 mg | ORAL_TABLET | Freq: Two times a day (BID) | ORAL | Status: DC
  Administered 2018-09-13 – 2018-09-14 (×2): 20 mg via ORAL
  Filled 2018-09-13 (×2): qty 1

## 2018-09-13 MED ORDER — ASPIRIN EC 81 MG PO TBEC
81.0000 mg | DELAYED_RELEASE_TABLET | Freq: Every morning | ORAL | Status: DC
  Administered 2018-09-14: 81 mg via ORAL
  Filled 2018-09-13: qty 1

## 2018-09-13 MED ORDER — TORSEMIDE 20 MG PO TABS
40.0000 mg | ORAL_TABLET | Freq: Two times a day (BID) | ORAL | Status: DC
  Administered 2018-09-13 – 2018-09-14 (×2): 40 mg via ORAL
  Filled 2018-09-13 (×2): qty 2

## 2018-09-13 MED ORDER — ALBUTEROL SULFATE (2.5 MG/3ML) 0.083% IN NEBU: 2.5000 mg | INHALATION_SOLUTION | RESPIRATORY_TRACT | Status: DC | PRN

## 2018-09-13 MED ORDER — IPRATROPIUM-ALBUTEROL 0.5-2.5 (3) MG/3ML IN SOLN: 3.0000 mL | RESPIRATORY_TRACT | Status: DC | PRN

## 2018-09-13 MED ORDER — BUDESONIDE 0.5 MG/2ML IN SUSP
0.5000 mg | Freq: Two times a day (BID) | RESPIRATORY_TRACT | Status: DC
  Administered 2018-09-13 – 2018-09-14 (×2): 0.5 mg via RESPIRATORY_TRACT
  Filled 2018-09-13 (×3): qty 2

## 2018-09-13 MED ORDER — LINACLOTIDE 145 MCG PO CAPS
290.0000 ug | ORAL_CAPSULE | Freq: Every day | ORAL | Status: DC
  Filled 2018-09-13: qty 2

## 2018-09-13 MED ORDER — PREGABALIN 75 MG PO CAPS
150.0000 mg | ORAL_CAPSULE | Freq: Two times a day (BID) | ORAL | Status: DC
  Administered 2018-09-13 – 2018-09-14 (×2): 150 mg via ORAL
  Filled 2018-09-13 (×2): qty 2

## 2018-09-13 MED ORDER — ALBUTEROL SULFATE HFA 108 (90 BASE) MCG/ACT IN AERS
2.0000 | INHALATION_SPRAY | RESPIRATORY_TRACT | Status: DC | PRN
  Administered 2018-09-13: 2 via RESPIRATORY_TRACT
  Filled 2018-09-13: qty 6.7

## 2018-09-13 MED ORDER — INSULIN GLARGINE 100 UNIT/ML ~~LOC~~ SOLN
80.0000 [IU] | Freq: Every day | SUBCUTANEOUS | Status: DC
  Administered 2018-09-14: 80 [IU] via SUBCUTANEOUS
  Filled 2018-09-13 (×2): qty 0.8

## 2018-09-13 MED ORDER — SPIRONOLACTONE 25 MG PO TABS
50.0000 mg | ORAL_TABLET | Freq: Every day | ORAL | Status: DC
  Administered 2018-09-14: 50 mg via ORAL
  Filled 2018-09-13: qty 2

## 2018-09-13 MED ORDER — INSULIN ASPART 100 UNIT/ML ~~LOC~~ SOLN
0.0000 [IU] | Freq: Three times a day (TID) | SUBCUTANEOUS | Status: DC
  Administered 2018-09-14: 15 [IU] via SUBCUTANEOUS
  Administered 2018-09-14: 8 [IU] via SUBCUTANEOUS

## 2018-09-13 MED ORDER — ACETAMINOPHEN 500 MG PO TABS
500.0000 mg | ORAL_TABLET | Freq: Four times a day (QID) | ORAL | Status: DC | PRN
  Administered 2018-09-14: 500 mg via ORAL
  Filled 2018-09-13: qty 1

## 2018-09-13 MED ORDER — INSULIN ASPART 100 UNIT/ML ~~LOC~~ SOLN
0.0000 [IU] | Freq: Every day | SUBCUTANEOUS | Status: DC
  Administered 2018-09-13: 2 [IU] via SUBCUTANEOUS

## 2018-09-13 MED ORDER — FLUOXETINE HCL 20 MG PO CAPS
40.0000 mg | ORAL_CAPSULE | Freq: Every day | ORAL | Status: DC
  Administered 2018-09-14: 40 mg via ORAL
  Filled 2018-09-13: qty 2

## 2018-09-13 MED ORDER — TRAZODONE HCL 50 MG PO TABS: 50.0000 mg | ORAL_TABLET | Freq: Every evening | ORAL | Status: DC | PRN

## 2018-09-13 MED ORDER — METOCLOPRAMIDE HCL 5 MG/ML IJ SOLN
10.0000 mg | Freq: Once | INTRAMUSCULAR | Status: AC
  Administered 2018-09-13: 10 mg via INTRAVENOUS
  Filled 2018-09-13: qty 2

## 2018-09-13 MED ORDER — BUSPIRONE HCL 15 MG PO TABS
15.0000 mg | ORAL_TABLET | Freq: Two times a day (BID) | ORAL | Status: DC
  Administered 2018-09-13 – 2018-09-14 (×2): 15 mg via ORAL
  Filled 2018-09-13 (×2): qty 1

## 2018-09-13 MED ORDER — INSULIN ASPART 100 UNIT/ML ~~LOC~~ SOLN
5.0000 [IU] | Freq: Three times a day (TID) | SUBCUTANEOUS | Status: DC
  Administered 2018-09-14 (×2): 5 [IU] via SUBCUTANEOUS

## 2018-09-13 MED ORDER — MOMETASONE FURO-FORMOTEROL FUM 100-5 MCG/ACT IN AERO
2.0000 | INHALATION_SPRAY | Freq: Two times a day (BID) | RESPIRATORY_TRACT | Status: DC
  Administered 2018-09-13 – 2018-09-14 (×2): 2 via RESPIRATORY_TRACT
  Filled 2018-09-13: qty 8.8

## 2018-09-13 MED ORDER — ALPRAZOLAM 0.5 MG PO TABS: 0.5000 mg | ORAL_TABLET | Freq: Two times a day (BID) | ORAL | Status: DC | PRN

## 2018-09-13 NOTE — ED Notes (Signed)
ED TO INPATIENT HANDOFF REPORT  ED Nurse Name and Phone #: Vilinda Blanks [phone]  S Name/Age/Gender [name] 48 y.o. female Room/Bed: 043C/043C  Code Status   Code Status: Prior  Home/SNF/Other Home Patient oriented to: self, situation Is this baseline? No   Triage Complete: Triage complete  Chief Complaint Slurred Speech; HA  Triage Note Patient arrived from home via [facility] with reports of slurred speech. Last known well is about 12.30pm yesterday (09/13/18). Spouse called EMS reporting that when he returned from work at about 12 midnight, his wife's speech was slurred. Patient did not want to come to the ED until she spoke to her hospice care personal this morning. Patient also reports headache on both side of her temple.  Husband's phone number [phone]   Allergies Allergies  Allergen Reactions  . Adhesive [Tape] Rash and Other (See Comments)    TAKES OFF THE SKIN (CERTAIN MEDICAL TAPES DO THIS!!)  . Metoprolol Shortness Of Breath    Occurrence of shortness of breath after 3 days  . Montelukast Shortness Of Breath  . Morphine Sulfate Anaphylaxis, Shortness Of Breath and Nausea And Vomiting    Swollen Throat - Able to tolerate dilaudid  . Penicillins Anaphylaxis, Hives and Shortness Of Breath    Throat swells Has patient had a PCN reaction causing immediate rash, facial/tongue/throat swelling, SOB or lightheadedness with hypotension: Yes Has patient had a PCN reaction causing severe rash involving mucus membranes or skin necrosis: No Has patient had a PCN reaction that required hospitalization: Yes Has patient had a PCN reaction occurring within the last 10 years: No If all of the above answers are "NO", then may proceed with Cephalosporin use.   . Prednisone Anaphylaxis  . Diltiazem Swelling  . Gabapentin Swelling    Level of Care/Admitting Diagnosis ED Disposition    ED Disposition Condition [facility] Area: [place]  [100100]  Level of Care: Medical Telemetry [104]  I expect the patient will be discharged within 24 hours: Yes  LOW acuity---Tx typically complete <24 hrs---ACUTE conditions typically can be evaluated <24 hours---LABS likely to return to acceptable levels <24 hours---IS near functional baseline---EXPECTED to return to current living arrangement---NOT newly hypoxic: Meets criteria for 5C-Observation unit  Diagnosis: Left-sided weakness [027741]  Admitting Physician: Domenic Polite [[id number]]  Attending Physician: Domenic Polite [[id number]]  PT Class (Do Not Modify): Observation [104]  PT Acc Code (Do Not Modify): Observation [10022]       B Medical/Surgery History Past Medical History:  Diagnosis Date  . Anxiety    takes Prozac daily  . Anxiety   . Aortic valve calcification   . Asthma    Advair and Spirva daily  . Asthma   . Bipolar disorder ([place])   . CAD (coronary artery disease)    a. LHC 11/2013 done for CP/fluid retention: mild disease in prox LAD, mild-mod disease in mRCA, EF 60% with normal LVEDP. b. Normal nuc 03/2016.  Marland Kitchen CHF (congestive heart failure) ([place])   . Chronic diastolic CHF (congestive heart failure) ([place])   . CKD (chronic kidney disease), stage II   . COPD (chronic obstructive pulmonary disease) ([facility])    a. nocturnal O2.  Marland Kitchen COPD (chronic obstructive pulmonary disease) ([place])   . Coronary artery disease   . Decreased urine stream   . Diabetes mellitus   . Diabetes mellitus without complication ([place])   . Dyspnea   . Family history of adverse reaction to anesthesia  mom gets nauseated  . GERD (gastroesophageal reflux disease)    takes Pepcid daily  . History of blood clots    left leg 3-30yrs ago  . Hyperlipidemia   . Hypertension   . Hypertriglyceridemia   . Inguinal hernia, left 01/2015  . Muscle spasm   . Open wound of genital labia   . Peripheral neuropathy   . RBBB   . Seizures ([place])   . Sepsis ([place]) 01/19/2018  . Sinus tachycardia    a. persistent  since 2009.  Marland Kitchen Smokers' cough ([place])   . Stroke [facility]) 1989   left sided weakness  . TIA (transient ischemic attack)   . Tobacco abuse   . Vulvar abscess 01/23/2018   Past Surgical History:  Procedure Laterality Date  . HERNIA REPAIR    . INCISION AND DRAINAGE ABSCESS N/A 01/26/2018   Procedure: INCISION AND DEBRIDEMENT OF VULVAR NECROTIZING SOFT TISSUE INFECTION;  Surgeon: Greer Pickerel, MD;  Location: [place];  Service: General;  Laterality: N/A;  . INCISION AND DRAINAGE PERIRECTAL ABSCESS N/A 01/22/2018   Procedure: IRRIGATION AND DEBRIDEMENT LABIAL/VULVAR AREA;  Surgeon: Coralie Keens, MD;  Location: [place];  Service: General;  Laterality: N/A;  . INCISION AND DRAINAGE PERIRECTAL ABSCESS N/A 01/29/2018   Procedure: IRRIGATION AND DEBRIDEMENT VULVA;  Surgeon: Excell Seltzer, MD;  Location: [place];  Service: General;  Laterality: N/A;  . INGUINAL HERNIA REPAIR Left 04/08/2015   Procedure: OPEN LEFT INGUINAL HERNIA REPAIR WITH MESH;  Surgeon: Ralene Ok, MD;  Location: [place];  Service: General;  Laterality: Left;  . INSERTION OF MESH Left 04/08/2015   Procedure: INSERTION OF MESH;  Surgeon: Ralene Ok, MD;  Location: [place];  Service: General;  Laterality: Left;  . LAPAROSCOPY     Endometriosis  . LEFT HEART CATHETERIZATION WITH CORONARY ANGIOGRAM N/A 12/05/2013   Procedure: LEFT HEART CATHETERIZATION WITH CORONARY ANGIOGRAM;  Surgeon: Jettie Booze, MD;  Location: [facility] CATH LAB;  Service: Cardiovascular;  Laterality: N/A;  . right kidney drained    . TEE WITHOUT CARDIOVERSION N/A 11/28/2013   Procedure: TRANSESOPHAGEAL ECHOCARDIOGRAM (TEE);  Surgeon: Thayer Headings, MD;  Location: [place];  Service: Cardiovascular;  Laterality: N/A;     A IV Location/Drains/Wounds Patient Lines/Drains/Airways Status   Active Line/Drains/Airways    Name:   Placement date:   Placement time:   Site:   Days:   Peripheral IV 09/13/18 Left Forearm   09/13/18    0958    Forearm   less than 1    External Urinary Catheter   01/23/18    0100    -   233   Incision (Closed) 04/08/15 Groin Left   04/08/15    1419     1254   Incision (Closed) 01/22/18 Perineum   01/22/18    1856     234   Incision (Closed) 01/26/18 Perineum Other (Comment)   01/26/18    1604     230   Incision (Closed) 01/29/18 Vagina Left   01/29/18    1340     227   Pressure Injury 01/26/18 Stage I -  Intact skin with non-blanchable redness of a localized area usually over a bony prominence.   01/26/18    1810     230   Wound / Incision (Open or Dehisced) 01/21/18 Non-pressure wound Labia Left open area due to cellulitis   01/21/18    1306    Labia   235  Intake/Output Last 24 hours No intake or output data in the 24 hours ending 09/13/18 1407  Labs/Imaging Results for orders placed or performed during the hospital encounter of 09/13/18 (from the past 48 hour(s))  Ethanol     Status: None   Collection Time: 09/13/18 10:14 AM  Result Value Ref Range   Alcohol, Ethyl (B) <10 <10 mg/dL    Comment: (NOTE) Lowest detectable limit for serum alcohol is 10 mg/dL. For medical purposes only. Performed at [facility], [place] [address], [place], [place] [address]   Protime-INR     Status: None   Collection Time: 09/13/18 10:14 AM  Result Value Ref Range   Prothrombin Time 15.2 11.4 - 15.2 seconds   INR 1.2 0.8 - 1.2    Comment: (NOTE) INR goal varies based on device and disease states. Performed at [facility], [place] [address], [place], [place] [address]   APTT     Status: None   Collection Time: 09/13/18 10:14 AM  Result Value Ref Range   aPTT 29 24 - 36 seconds    Comment: Performed at [facility] [address], [place], [place] [address]  CBC     Status: Abnormal   Collection Time: 09/13/18 10:14 AM  Result Value Ref Range   WBC 12.4 (H) 4.0 - 10.5 K/uL   RBC 4.61 3.87 - 5.11 MIL/uL   Hemoglobin 10.6 (L) 12.0 - 15.0 g/dL   HCT 36.2 36.0 - 46.0 %   MCV 78.5 (L) 80.0 - 100.0  fL   MCH 23.0 (L) 26.0 - 34.0 pg   MCHC 29.3 (L) 30.0 - 36.0 g/dL   RDW 21.9 (H) 11.5 - 15.5 %   Platelets 200 150 - 400 K/uL    Comment: REPEATED TO VERIFY   nRBC 0.0 0.0 - 0.2 %    Comment: Performed at [facility], [place] [address], [place], [place] [address]  Differential     Status: Abnormal   Collection Time: 09/13/18 10:14 AM  Result Value Ref Range   Neutrophils Relative % 82 %   Neutro Abs 10.3 (H) 1.7 - 7.7 K/uL   Lymphocytes Relative 10 %   Lymphs Abs 1.2 0.7 - 4.0 K/uL   Monocytes Relative 5 %   Monocytes Absolute 0.7 0.1 - 1.0 K/uL   Eosinophils Relative 1 %   Eosinophils Absolute 0.2 0.0 - 0.5 K/uL   Basophils Relative 1 %   Basophils Absolute 0.1 0.0 - 0.1 K/uL   Immature Granulocytes 1 %   Abs Immature Granulocytes 0.08 (H) 0.00 - 0.07 K/uL    Comment: Performed at [facility] [address], [place], [place] [address]  Comprehensive metabolic panel     Status: Abnormal   Collection Time: 09/13/18 10:14 AM  Result Value Ref Range   Sodium 134 (L) 135 - 145 mmol/L   Potassium 3.6 3.5 - 5.1 mmol/L   Chloride 99 98 - 111 mmol/L   CO2 25 22 - 32 mmol/L   Glucose, Bld 159 (H) 70 - 99 mg/dL   BUN 23 (H) 6 - 20 mg/dL   Creatinine, Ser 1.19 (H) 0.44 - 1.00 mg/dL   Calcium 8.9 8.9 - 10.3 mg/dL   Total Protein 8.4 (H) 6.5 - 8.1 g/dL   Albumin 3.4 (L) 3.5 - 5.0 g/dL   AST 21 15 - 41 U/L   ALT 18 0 - 44 U/L   Alkaline Phosphatase 103 38 -  126 U/L   Total Bilirubin 1.5 (H) 0.3 - 1.2 mg/dL   GFR calc non Af Amer 54 (L) >60 mL/min   GFR calc Af Amer >60 >60 mL/min   Anion gap 10 5 - 15    Comment: Performed at [facility] [address], [place], [place] [address]  I-Stat beta hCG blood, ED     Status: Abnormal   Collection Time: 09/13/18 10:21 AM  Result Value Ref Range   I-stat hCG, quantitative 5.6 (H) <5 mIU/mL   Comment 3            Comment:   GEST. AGE      CONC.  (mIU/mL)   <=1 WEEK        5 - 50     2 WEEKS       50 - 500     3 WEEKS        100 - 10,000     4 WEEKS     1,000 - 30,000        FEMALE AND NON-PREGNANT FEMALE:     LESS THAN 5 mIU/mL   I-STAT 7, (LYTES, BLD GAS, ICA, H+H)     Status: Abnormal   Collection Time: 09/13/18  1:53 PM  Result Value Ref Range   pH, Arterial 7.435 7.350 - 7.450   pCO2 arterial 42.1 32.0 - 48.0 mmHg   pO2, Arterial 55.0 (L) 83.0 - 108.0 mmHg   Bicarbonate 28.4 (H) 20.0 - 28.0 mmol/L   TCO2 30 22 - 32 mmol/L   O2 Saturation 90.0 %   Acid-Base Excess 4.0 (H) 0.0 - 2.0 mmol/L   Sodium 136 135 - 145 mmol/L   Potassium 3.6 3.5 - 5.1 mmol/L   Calcium, Ion 1.18 1.15 - 1.40 mmol/L   HCT 32.0 (L) 36.0 - 46.0 %   Hemoglobin 10.9 (L) 12.0 - 15.0 g/dL   Patient temperature 97.6 F    Collection site RADIAL, ALLEN'S TEST ACCEPTABLE    Drawn by Operator    Sample type ARTERIAL    Ct Head Wo Contrast  Result Date: 09/13/2018 CLINICAL DATA:  Slurred speech.  Left-sided weakness. EXAM: CT HEAD WITHOUT CONTRAST TECHNIQUE: Contiguous axial images were obtained from the base of the skull through the vertex without intravenous contrast. COMPARISON:  03/17/2017 FINDINGS: Brain: No evidence of acute infarction, hemorrhage, hydrocephalus, extra-axial collection or mass lesion/mass effect. Vascular: No hyperdense vessel or unexpected calcification. Skull: Normal. Negative for fracture or focal lesion. Sinuses/Orbits: No acute finding. Other: Metallic BB within left side of scalp is again noted IMPRESSION: 1. No acute intracranial abnormality.  Negative head CT Electronically Signed   By: Kerby Moors M.D.   On: 09/13/2018 12:18    Pending Labs Unresulted Labs (From admission, onward)    Start     Ordered   09/13/18 1010  Urine rapid drug screen (hosp performed)  ONCE - STAT,   STAT     09/13/18 1010   09/13/18 1010  Urinalysis, Routine w reflex microscopic  ONCE - STAT,   STAT     09/13/18 1010          Vitals/Pain Today's Vitals   09/13/18 1130 09/13/18 1200 09/13/18 1205 09/13/18 1215  BP:  109/60 132/69  135/77  Pulse: 79 79  80  Resp: 14 13  15   Temp:      TempSrc:      SpO2: (!) 87% 90%  (!) 84%  Weight:   136.1 kg  Height:   5\' 4"  (1.626 m)   PainSc:        Isolation Precautions No active isolations  Medications Medications  albuterol (PROVENTIL HFA;VENTOLIN HFA) 108 (90 Base) MCG/ACT inhaler 2 puff (2 puffs Inhalation Given 09/13/18 1246)  metoCLOPramide (REGLAN) injection 10 mg (10 mg Intravenous Given 09/13/18 1248)  diphenhydrAMINE (BENADRYL) injection 12.5 mg (12.5 mg Intravenous Given 09/13/18 1247)    Mobility non-ambulatory Low fall risk   Focused Assessments Neuro Assessment Handoff:  Swallow screen pass? Yes  Cardiac Rhythm: Normal sinus rhythm NIH Stroke Scale ( + Modified Stroke Scale Criteria)  Interval: Initial Level of Consciousness (1a.)   : Alert, keenly responsive LOC Questions (1b. )   +: Answers one question correctly LOC Commands (1c. )   + : Performs both tasks correctly Best Gaze (2. )  +: Normal Visual (3. )  +: No visual loss Facial Palsy (4. )    : Normal symmetrical movements Motor Arm, Left (5a. )   +: Drift Motor Arm, Right (5b. )   +: No drift Motor Leg, Left (6a. )   +: Drift Motor Leg, Right (6b. )   +: No drift Limb Ataxia (7. ): Present in two limbs Sensory (8. )   +: Mild-to-moderate sensory loss, patient feels pinprick is less sharp or is dull on the affected side, or there is a loss of superficial pain with pinprick, but patient is aware of being touched Best Language (9. )   +: Mild-to-moderate aphasia Dysarthria (10. ): Mild-to-moderate dysarthria, patient slurs at least some words and, at worst, can be understood with some difficulty Extinction/Inattention (11.)   +: No Abnormality Modified SS Total  +: 5 Complete NIHSS TOTAL: 8     Neuro Assessment: Within Defined Limits Neuro Checks:   Initial (09/13/18 1011)  Last Documented NIHSS Modified Score: 5 (09/13/18 1248) Has TPA been given? No If patient is a  Neuro Trauma and patient is going to OR before floor call report to [place] nurse: [phone] or [phone]     R Recommendations: See Admitting Provider Note  Report given to:   Additional Notes: Pt from home with husband who states her last seen normal was 1230 yesterday afternoon before he went to work.  When he spoke to her 12 hrs later, noted her speech was slurred.  He also states she is not oriented to date and has trouble finding words which is not normal for her.  Left sided weakness which is also reportedly new, though she does have Hx of stroke. CT NEGATIVE. Neuro has seen pt here in ER. Pt pleasant, cooperative, stable, and passed swallow screen.  Modified NIH 5 for speech and left sided drift/weakness.

## 2018-09-13 NOTE — Progress Notes (Signed)
EEG completed; results pending.    
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
## 2018-09-13 NOTE — Consult Note (Addendum)
Neurology Consultation  Reason for Consult: AMS Referring Physician: Dr. Melina Copa  CC: Altered mental status, left-sided weakness  History is obtained from: Patient, chart, patient's husband  HPI: [name] is a 48 y.o. female past medical history of anxiety currently on Prozac as well as benzodiazepines, coronary artery disease, bipolar disorder, diabetes, hypertension, hyperlipidemia, morbid obesity, old stroke with left-sided weakness documented in the chart with no residual left-sided weakness that she notices, was brought in by husband because she had an episode last night where she had a blank stare and shaking of the whole body.  Her symptoms resolved in 10 minutes after he gave her her medications as well as supplemental oxygen.  She continued to have difficulty with speech and developed left-sided weakness and was brought to the hospital then. She continues to complain of left-sided weakness and a headache. In the emergency room, she was given a migraine cocktail by the ED provider.  Her headache still continues to be frontal, throbbing and bothersome. No alleviating factors. She reports current ongoing anxiety.  Also continues to smoke.  LKW: 11:59 PM on 09/12/2018 tpa given?: no, outside the window Premorbid modified Rankin scale (mRS): 3-4   ROS: ROS was performed and is negative except as noted in the HPI.    Past Medical History:  Diagnosis Date  . Anxiety    takes Prozac daily  . Anxiety   . Aortic valve calcification   . Asthma    Advair and Spirva daily  . Asthma   . Bipolar disorder ([place])   . CAD (coronary artery disease)    a. LHC 11/2013 done for CP/fluid retention: mild disease in prox LAD, mild-mod disease in mRCA, EF 60% with normal LVEDP. b. Normal nuc 03/2016.  Marland Kitchen CHF (congestive heart failure) ([place])   . Chronic diastolic CHF (congestive heart failure) ([place])   . CKD (chronic kidney disease), stage II   . COPD (chronic obstructive pulmonary disease) (H[facility])     a. nocturnal O2.  Marland Kitchen COPD (chronic obstructive pulmonary disease) ([place])   . Coronary artery disease   . Decreased urine stream   . Diabetes mellitus   . Diabetes mellitus without complication ([place])   . Dyspnea   . Family history of adverse reaction to anesthesia    mom gets nauseated  . GERD (gastroesophageal reflux disease)    takes Pepcid daily  . History of blood clots    left leg 3-44yrs ago  . Hyperlipidemia   . Hypertension   . Hypertriglyceridemia   . Inguinal hernia, left 01/2015  . Muscle spasm   . Open wound of genital labia   . Peripheral neuropathy   . RBBB   . Seizures ([place])   . Sepsis ([place]) 01/19/2018  . Sinus tachycardia    a. persistent since 2009.  Marland Kitchen Smokers' cough ([place])   . Stroke [facility]) 1989   left sided weakness  . TIA (transient ischemic attack)   . Tobacco abuse   . Vulvar abscess 01/23/2018    Family History  Problem Relation Age of Onset  . Venous thrombosis Brother   . Other Brother        BRAIN TUMOR  . Asthma Father   . Diabetes Father   . Coronary artery disease Mother   . Hypertension Mother   . Diabetes Mother   . Asthma Sister   . Diabetes type II Brother    Social History:   reports that she has been smoking cigarettes. She has a 36.00 pack-year  smoking history. She has never used smokeless tobacco. She reports that she does not drink alcohol or use drugs.  Medications  Current Facility-Administered Medications:  .  albuterol (PROVENTIL HFA;VENTOLIN HFA) 108 (90 Base) MCG/ACT inhaler 2 puff, 2 puff, Inhalation, Q4H PRN, Hayden Rasmussen, MD, 2 puff at 09/13/18 1246  Current Outpatient Medications:  .  acetaminophen (TYLENOL) 500 MG tablet, Take 500 mg by mouth every 4 (four) hours as needed., Disp: , Rfl:  .  albuterol (PROVENTIL HFA;VENTOLIN HFA) 108 (90 Base) MCG/ACT inhaler, INHALE 2 PUFFS INTO LUNGS EVERY 4 HOURS AS NEEDED FOR SHORTNESS OF BREATH, Disp: 6.7 g, Rfl: 0 .  ALPRAZolam (XANAX) 1 MG tablet, Take 1 mg by mouth 3  (three) times daily as needed for anxiety., Disp: , Rfl:  .  aspirin EC 81 MG tablet, Take 81 mg by mouth every morning., Disp: , Rfl:  .  budesonide (PULMICORT) 0.5 MG/2ML nebulizer solution, Take 0.5 mg by nebulization 2 (two) times daily., Disp: , Rfl:  .  budesonide-formoterol (SYMBICORT) 80-4.5 MCG/ACT inhaler, INHALE 2 PUFFS BY MOUTH EVERY 12 HOURS TO PREVENT COUGH OR WHEEZING *RINSE MOUTH AFTER USING*, Disp: 10.2 g, Rfl: 2 .  busPIRone (BUSPAR) 30 MG tablet, Take 30 mg by mouth 2 (two) times daily., Disp: , Rfl:  .  famotidine (PEPCID) 20 MG tablet, TAKE 1 TABLET BY MOUTH TWICE (2) DAILY FOR ACID INDIGESTION (Patient taking differently: Take 20 mg by mouth 2 (two) times daily. ), Disp: 180 tablet, Rfl: 3 .  FLUoxetine (PROZAC) 40 MG capsule, Take 40 mg by mouth daily. , Disp: , Rfl:  .  insulin regular human CONCENTRATED (HUMULIN R U-500 KWIKPEN) 500 UNIT/ML kwikpen, Inject 150 Units into the skin 2 (two) times daily with a meal., Disp: 12 pen, Rfl: 11 .  ipratropium-albuterol (DUONEB) 0.5-2.5 (3) MG/3ML SOLN, Take 3 mLs by nebulization every 4 (four) hours as needed (wheezing/shortness of breath)., Disp: 360 mL, Rfl: 0 .  loperamide (IMODIUM A-D) 2 MG tablet, Take 4 mg by mouth 3 (three) times daily as needed for diarrhea or loose stools., Disp: , Rfl:  .  metFORMIN (GLUCOPHAGE) 1000 MG tablet, Take 1,000 mg by mouth 2 (two) times daily with a meal., Disp: , Rfl:  .  metolazone (ZAROXOLYN) 2.5 MG tablet, Take 2.5 mg by mouth every other day. Take 2.5 mg by mouth every 48 hours on Tuesday, Thursday and Saturday, Disp: , Rfl:  .  OXYGEN, Inhale 4 L into the lungs at bedtime. , Disp: , Rfl:  .  pregabalin (LYRICA) 300 MG capsule, Take 1 capsule (300 mg total) by mouth 3 (three) times daily. For neuropathy, Disp: 270 capsule, Rfl: 0 .  PRESCRIPTION MEDICATION, Place 1 drop into both eyes daily as needed (every 14 weeks before injections)., Disp: , Rfl:  .  ranitidine (ZANTAC) 150 MG tablet, Take  150 mg by mouth 2 (two) times daily. , Disp: , Rfl:  .  rOPINIRole (REQUIP) 1 MG tablet, Take 1 tablet (1 mg total) by mouth at bedtime. For restless legs., Disp: 90 tablet, Rfl: 1 .  spironolactone (ALDACTONE) 50 MG tablet, TAKE 1 TABLET BY MOUTH ONCE DAILY (Patient taking differently: Take 50 mg by mouth daily. ), Disp: 30 tablet, Rfl: 3 .  torsemide (DEMADEX) 20 MG tablet, Take 40 mg by mouth 2 (two) times daily., Disp: , Rfl:  .  traZODone (DESYREL) 50 MG tablet, Take 50-150 mg by mouth at bedtime., Disp: , Rfl:  .  glucose blood (  ONE TOUCH ULTRA TEST) test strip, Used to check blood sugars 4x daily., Disp: 200 each, Rfl: 4 .  Insulin Pen Needle 32G X 4 MM MISC, Used to give insulin injections twice daily., Disp: 100 each, Rfl: 11 .  linaclotide (LINZESS) 290 MCG CAPS capsule, Take 1 capsule (290 mcg total) by mouth daily before breakfast., Disp: 90 capsule, Rfl: 0 .  nystatin (MYCOSTATIN) 100000 UNIT/ML suspension, Swish, gargle and swallow 5 mls in the mouth four times daily. (Patient not taking: Reported on 09/13/2018), Disp: 140 mL, Rfl: 0 .  torsemide (DEMADEX) 10 MG tablet, Take 3 tablets by mouth once daily for swelling and fluid retention. (Patient not taking: Reported on 09/13/2018), Disp: 90 tablet, Rfl: 0 .  ULTICARE MINI PEN NEEDLES 31G X 6 MM MISC, USE AS DIRECTED THREE TIMES DAILY, Disp: 100 each, Rfl: 0  Exam: Current vital signs: BP 135/77   Pulse 80   Temp 97.6 F (36.4 C) (Oral)   Resp 15   Ht 5\' 4"  (1.626 m)   Wt 136.1 kg   LMP 11/11/2011   SpO2 (!) 84%   BMI 51.49 kg/m  Vital signs in last 24 hours: Temp:  [97.6 F (36.4 C)] 97.6 F (36.4 C) (03/05 1011) Pulse Rate:  [79-92] 80 (03/05 1215) Resp:  [13-19] 15 (03/05 1215) BP: (102-135)/(51-77) 135/77 (03/05 1215) SpO2:  [84 %-90 %] 84 % (03/05 1215) Weight:  [136.1 kg] 136.1 kg (03/05 1205) General: Awake alert in no distress HEENT: Normocephalic atraumatic dry mucous membranes Lungs: Wheezing and crackles  bilaterally CVS: S1-S2 regular rate rhythm Extremities: 2+ pitting edema and changes of chronic venous stasis in both lower extremities with healing and crusted ulcers. Abdomen: Obese, nontender Neurological exam She is awake alert oriented x2. Repetition intact Her speech is stuttering and not truly dysarthric but she has a childlike quality to her speech. She is able to follow commands. Cranial nerves: Pupils equal round react light, extraocular movements intact, visual fields full, face symmetric, hearing intact, tongue uvula and soft palate midline, normal sternocleidomastoid and trapezius muscle strength.  No tongue atrophy or fibrillations. Motor exam: Left upper extremity is at least 4+/5 with some giveaway weakness.  Left lower extremity has giveaway weakness as well.  Right upper and lower extremity 5/5. Sensory exam: Intact to light touch on the right, sharp cut off to decrease sensation on the left. Coordination: Finger-nose-finger intact Gait testing deferred at this time  NIHSS 1a Level of Conscious.: 0 1b LOC Questions: 0 1c LOC Commands: 0 2 Best Gaze: 0 3 Visual: 0 4 Facial Palsy: 0 5a Motor Arm - left: 1 5b Motor Arm - Right: 0 6a Motor Leg - Left: 1 6b Motor Leg - Right: 0 7 Limb Ataxia: 0 8 Sensory: 2 9 Best Language: 0 10 Dysarthria: 1 11 Extinct. and Inatten.: 0 TOTAL: 5  Labs I have reviewed labs in epic and the results pertinent to this consultation are: CBC    Component Value Date/Time   WBC 12.4 (H) 09/13/2018 1014   RBC 4.61 09/13/2018 1014   HGB 10.6 (L) 09/13/2018 1014   HCT 36.2 09/13/2018 1014   PLT 200 09/13/2018 1014   MCV 78.5 (L) 09/13/2018 1014   MCH 23.0 (L) 09/13/2018 1014   MCHC 29.3 (L) 09/13/2018 1014   RDW 21.9 (H) 09/13/2018 1014   LYMPHSABS 1.2 09/13/2018 1014   MONOABS 0.7 09/13/2018 1014   EOSABS 0.2 09/13/2018 1014   BASOSABS 0.1 09/13/2018 1014    CMP  Component Value Date/Time   NA 134 (L) 09/13/2018 1014   NA  137 01/08/2018 1117   K 3.6 09/13/2018 1014   CL 99 09/13/2018 1014   CO2 25 09/13/2018 1014   GLUCOSE 159 (H) 09/13/2018 1014   BUN 23 (H) 09/13/2018 1014   BUN 31 (H) 01/08/2018 1117   CREATININE 1.19 (H) 09/13/2018 1014   CREATININE 1.20 (H) 05/24/2016 0938   CALCIUM 8.9 09/13/2018 1014   PROT 8.4 (H) 09/13/2018 1014   PROT 7.8 01/08/2018 1117   ALBUMIN 3.4 (L) 09/13/2018 1014   ALBUMIN 4.1 01/08/2018 1117   AST 21 09/13/2018 1014   ALT 18 09/13/2018 1014   ALKPHOS 103 09/13/2018 1014   BILITOT 1.5 (H) 09/13/2018 1014   BILITOT 0.5 01/08/2018 1117   GFRNONAA 54 (L) 09/13/2018 1014   GFRAA >60 09/13/2018 1014   Imaging I have reviewed the images obtained: CT-scan of the brain-no acute changes.  Metallic BB in scalp on left MRI examination of the brain will not be possible because of the metallic object in scalp  Assessment:  48 year old with significant cerebrovascular risk factor history presenting with an episode concerning for seizure-like activity followed by left-sided weakness. On my examination, her exam is a little inconsistent with what looks like giveaway weakness on the left.  Her speech also has a childlike quality which again points more towards a nonorganic etiology. But she has all the risk factors for strokes and I would like to get further work-up.  Impression: Evaluate for toxic metabolic encephalopathy-medication related versus CO2 narcosis Evaluate for electrographic disturbance-seizure disorder Evaluate for stroke Evaluate for conversion disorder  Recommendations: CT head in 24 hours to rule out any acute process in the right cerebral hemisphere causing left-sided weakness. 2D echocardiogram Remain on aspirin. Decrease sedating medications-benzodiazepines Check urinary toxicology screen and detail blood toxicology screen Check ammonia levels Check ABG-make sure there is no CO2 narcosis. Check urinalysis, chest x-ray EEG given concern for  seizure  Neurology will follow with you -- Amie Portland, MD [facility] Pager: [phone] If 7pm to 7am, please call on call as listed on AMION.

## 2018-09-13 NOTE — Care Management Obs Status (Signed)
[place] NOTIFICATION   Patient Details  Name: [name] MRN: [id number] Date of Birth: July 20, 1970   Medicare Observation Status Notification Given:  Yes(Pt asked her husband to sign the MOON)    Apolonio Schneiders, RN 09/13/2018, 6:45 PM

## 2018-09-13 NOTE — ED Triage Notes (Signed)
Patient arrived from home via [facility] with reports of slurred speech. Last known well is about 12.30pm yesterday (09/13/18). Spouse called EMS reporting that when he returned from work at about 12 midnight, his wife's speech was slurred. Patient did not want to come to the ED until she spoke to her hospice care personal this morning. Patient also reports headache on both side of her temple.  Husband's phone number [phone]

## 2018-09-13 NOTE — Procedures (Signed)
  [place] A. Merlene Laughter, MD     [email or web address]           HISTORY: This is a 48 year old female who presents with altered mental status and left-sided weakness concerning for post epileptic seizures as the etiology.  MEDICATIONS:  Current Facility-Administered Medications:  .  acetaminophen (TYLENOL) tablet 500 mg, 500 mg, Oral, Q6H PRN, Domenic Polite, MD .  albuterol (PROVENTIL) (2.5 MG/3ML) 0.083% nebulizer solution 2.5 mg, 2.5 mg, Nebulization, Q4H PRN, Domenic Polite, MD .  ALPRAZolam Duanne Moron) tablet 0.5 mg, 0.5 mg, Oral, BID PRN, Domenic Polite, MD .  aspirin EC tablet 81 mg, 81 mg, Oral, q morning - 10a, Domenic Polite, MD .  budesonide (PULMICORT) nebulizer solution 0.5 mg, 0.5 mg, Nebulization, BID, Domenic Polite, MD .  busPIRone (BUSPAR) tablet 15 mg, 15 mg, Oral, BID, Domenic Polite, MD .  famotidine (PEPCID) tablet 20 mg, 20 mg, Oral, BID, Domenic Polite, MD .  FLUoxetine (PROZAC) capsule 40 mg, 40 mg, Oral, Daily, Domenic Polite, MD .  insulin aspart (novoLOG) injection 0-15 Units, 0-15 Units, Subcutaneous, TID WC, Domenic Polite, MD .  insulin aspart (novoLOG) injection 0-5 Units, 0-5 Units, Subcutaneous, QHS, Domenic Polite, MD .  insulin aspart (novoLOG) injection 5 Units, 5 Units, Subcutaneous, TID WC, Domenic Polite, MD .  insulin glargine (LANTUS) injection 80 Units, 80 Units, Subcutaneous, Daily, Domenic Polite, MD .  ipratropium-albuterol (DUONEB) 0.5-2.5 (3) MG/3ML nebulizer solution 3 mL, 3 mL, Nebulization, Q4H PRN, Domenic Polite, MD .  Derrill Memo ON 09/14/2018] linaclotide (LINZESS) capsule 290 mcg, 290 mcg, Oral, QAC breakfast, Domenic Polite, MD .  loperamide (IMODIUM) capsule 4 mg, 4 mg, Oral, TID PRN, Domenic Polite, MD .  mometasone-formoterol [facility]) 100-5 MCG/ACT inhaler 2 puff, 2 puff, Inhalation, BID, Domenic Polite, MD .  pregabalin (LYRICA) capsule 150 mg, 150 mg, Oral, BID, Domenic Polite, MD .  Derrill Memo ON 09/14/2018]  spironolactone (ALDACTONE) tablet 50 mg, 50 mg, Oral, Daily, Domenic Polite, MD .  torsemide [facility]) tablet 40 mg, 40 mg, Oral, BID, Domenic Polite, MD, 40 mg at 09/13/18 1832 .  traZODone (DESYREL) tablet 50 mg, 50 mg, Oral, QHS PRN, Domenic Polite, MD     ANALYSIS: A 16 channel recording using standard 10 20 measurements is conducted for 21 minutes.  There is a well-formed posterior dominant rhythm of 9 Hz which attenuates with eye opening.  There is beta activity observed in the frontal areas.  Awake and drowsy activities are observed.  There is a few runs of ill-defined spindles and K complexes.  Photic stimulation and hyperventilation are not conducted.  There is no focal or lateral slowing.  There is no epileptiform activity is better.   IMPRESSION: 1.  This is a normal recording of awake and drowsy states.      [place]over[place] A. Merlene Laughter, M.D.  Diplomate, Tax adviser of Psychiatry and Neurology ( Neurology).

## 2018-09-13 NOTE — ED Provider Notes (Signed)
[facility] EMERGENCY DEPARTMENT Provider Note   CSN: [id number] Arrival date & time: 09/13/18  0957    History   Chief Complaint No chief complaint on file.   HPI Nancy Eppes is a 48 y.o. female.  She is presenting with strokelike symptoms since about midnight last night.  Patient was noted at that time when he returned from work to find her with some slurred speech.  She is also complaining of bitemporal headache is moderate in severity along with left-sided arm and leg weakness.  She said she has had weakness on the left leg for a long time since prior surgeries and it gives way at times.  She uses a walker.  She denies any numbness or visual symptoms.  She tried some Tylenol for her headache without any improvement.     The history is provided by the patient.  Cerebrovascular Accident  This is a new problem. The current episode started 6 to 12 hours ago. The problem occurs constantly. The problem has not changed since onset.Associated symptoms include headaches. Pertinent negatives include no chest pain, no abdominal pain and no shortness of breath. Nothing aggravates the symptoms. Nothing relieves the symptoms. She has tried nothing for the symptoms. The treatment provided no relief.    Past Medical History:  Diagnosis Date  . Anxiety    takes Prozac daily  . Anxiety   . Aortic valve calcification   . Asthma    Advair and Spirva daily  . Asthma   . Bipolar disorder ([place])   . CAD (coronary artery disease)    a. LHC 11/2013 done for CP/fluid retention: mild disease in prox LAD, mild-mod disease in mRCA, EF 60% with normal LVEDP. b. Normal nuc 03/2016.  Marland Kitchen CHF (congestive heart failure) ([place])   . Chronic diastolic CHF (congestive heart failure) ([place])   . CKD (chronic kidney disease), stage II   . COPD (chronic obstructive pulmonary disease) (HCC)    a. nocturnal O2.  Marland Kitchen COPD (chronic obstructive pulmonary disease) ([place])   . Coronary artery disease   .  Decreased urine stream   . Diabetes mellitus   . Diabetes mellitus without complication ([place])   . Dyspnea   . Family history of adverse reaction to anesthesia    mom gets nauseated  . GERD (gastroesophageal reflux disease)    takes Pepcid daily  . History of blood clots    left leg 3-23yrs ago  . Hyperlipidemia   . Hypertension   . Hypertriglyceridemia   . Inguinal hernia, left 01/2015  . Muscle spasm   . Open wound of genital labia   . Peripheral neuropathy   . RBBB   . Seizures ([place])   . Sepsis ([place]) 01/19/2018  . Sinus tachycardia    a. persistent since 2009.  Marland Kitchen Smokers' cough ([place])   . Stroke [facility]) 1989   left sided weakness  . TIA (transient ischemic attack)   . Tobacco abuse   . Vulvar abscess 01/23/2018    Patient Active Problem List   Diagnosis Date Noted  . Weakness of left lower extremity 09/05/2018  . Recurrent falls 09/05/2018  . PAD (peripheral artery disease) ([place]) 08/27/2018  . Chronic congestive heart failure ([place]) 08/02/2018  . Vaginal itching 06/15/2018  . Foot pain, bilateral 05/22/2018  . Chronic upper extremity pain (Secondary Area of Pain) (Bilateral) 04/23/2018  . Diabetic polyneuropathy associated with type 1 diabetes mellitus ([place]) 04/23/2018  . Long term prescription benzodiazepine use 04/23/2018  . Neurogenic  pain 04/23/2018  . Vitamin D insufficiency 01/29/2018  . Elevated C-reactive protein (CRP) 01/29/2018  . Elevated sedimentation rate 01/29/2018  . Pressure injury of skin 01/29/2018  . Poorly controlled type 2 diabetes mellitus ([place]) 01/23/2018  . DNR (do not resuscitate) 01/23/2018  . IBS (irritable bowel syndrome) 01/19/2018  . Elevated serum hCG 01/19/2018  . Chronic lower extremity pain (Primary Area of Pain) (Bilateral) 01/08/2018  . Chronic low back pain [facility] Area of Pain) (Bilateral) w/ sciatica (Bilateral) 01/08/2018  . Chronic pain syndrome 01/08/2018  . Pharmacologic therapy 01/08/2018  . Disorder of skeletal system  01/08/2018  . Problems influencing health status 01/08/2018  . Long term current use of opiate analgesic 01/08/2018  . Restless leg syndrome 08/02/2017  . Nocturnal hypoxemia 03/29/2017  . Vision disturbance 02/28/2017  . CKD (chronic kidney disease), stage II 02/28/2017  . Visual loss, bilateral   . Chronic respiratory failure with hypoxia (HCC)/ nocturnal 02 dep  10/28/2016  . Upper airway cough syndrome 08/22/2016  . Cigarette smoker 06/15/2016  . Simple chronic bronchitis ([place])   . Coronary artery disease involving native heart without angina pectoris 05/16/2016  . Acute on chronic diastolic (congestive) heart failure ([place]) 11/04/2015  . Orthopnea 11/03/2015  . Bilateral leg edema   . Diabetes ([place])   . COPD exacerbation ([place]) 10/15/2015  . Fracture of rib, closed 10/15/2015  . Venous stasis of both lower extremities 03/29/2015  . Preventative health care 02/04/2015  . Essential hypertension 01/02/2015  . Inguinal hernia 11/27/2014  . Allergic rhinitis with postnasal drip 01/21/2014  . Aortic valve disorders 04/18/2013  . Sleep apnea 05/22/2012  . Seizure disorder ([place]) 10/09/2011  . Bipolar disorder ([place]) 10/09/2011  . TIA (transient ischemic attack) 06/22/2011  . Constipation 06/15/2011  . HYPERTRIGLYCERIDEMIA 07/17/2007  . COPD GOLD 0  05/30/2007  . Morbid (severe) obesity due to excess calories ([place]) 04/23/2007  . Tobacco abuse 09/07/2006  . Asthma, persistent 09/07/2006    Past Surgical History:  Procedure Laterality Date  . HERNIA REPAIR    . INCISION AND DRAINAGE ABSCESS N/A 01/26/2018   Procedure: INCISION AND DEBRIDEMENT OF VULVAR NECROTIZING SOFT TISSUE INFECTION;  Surgeon: Greer Pickerel, MD;  Location: [facility];  Service: General;  Laterality: N/A;  . INCISION AND DRAINAGE PERIRECTAL ABSCESS N/A 01/22/2018   Procedure: IRRIGATION AND DEBRIDEMENT LABIAL/VULVAR AREA;  Surgeon: Coralie Keens, MD;  Location: [facility];  Service: General;  Laterality: N/A;  . INCISION  AND DRAINAGE PERIRECTAL ABSCESS N/A 01/29/2018   Procedure: IRRIGATION AND DEBRIDEMENT VULVA;  Surgeon: Excell Seltzer, MD;  Location: [facility];  Service: General;  Laterality: N/A;  . INGUINAL HERNIA REPAIR Left 04/08/2015   Procedure: OPEN LEFT INGUINAL HERNIA REPAIR WITH MESH;  Surgeon: Ralene Ok, MD;  Location: [facility];  Service: General;  Laterality: Left;  . INSERTION OF MESH Left 04/08/2015   Procedure: INSERTION OF MESH;  Surgeon: Ralene Ok, MD;  Location: [facility];  Service: General;  Laterality: Left;  . LAPAROSCOPY     Endometriosis  . LEFT HEART CATHETERIZATION WITH CORONARY ANGIOGRAM N/A 12/05/2013   Procedure: LEFT HEART CATHETERIZATION WITH CORONARY ANGIOGRAM;  Surgeon: Jettie Booze, MD;  Location[facility] C[facility] LAB;  Service: Cardiovascular;  Laterality: N/A;  . right kidney drained    . TEE WITHOUT CARDIOVERSION N/A 11/28/2013   Procedure: TRANSESOPHAGEAL ECHOCARDIOGRAM (TEE);  Surgeon: Thayer Headings, MD;  Location: [facility] ENDOSCOPY;  Service: Cardiovascular;  Laterality: N/A;     OB History   No obstetric history  on file.      Home Medications    Prior to Admission medications   Medication Sig Start Date End Date Taking? Authorizing Provider  acetaminophen (TYLENOL) 500 MG tablet Take 500 mg by mouth every 4 (four) hours as needed.    [provider]  albuterol (PROVENTIL HFA;VENTOLIN HFA) 108 (90 Base) MCG/ACT inhaler INHALE 2 PUFFS INTO LUNGS EVERY 4 HOURS AS NEEDED FOR SHORTNESS OF BREATH 06/15/18   Pleas Koch, NP  ALPRAZolam Duanne Moron) 1 MG tablet Take 1 mg by mouth 3 (three) times daily as needed for anxiety.    [provider]  aspirin EC 81 MG tablet Take 81 mg by mouth every morning.    [provider]  budesonide (PULMICORT) 0.5 MG/2ML nebulizer solution Take 0.5 mg by nebulization 2 (two) times daily.    [provider]  budesonide-formoterol (SYMBICORT) 80-4.5 MCG/ACT inhaler INHALE 2 PUFFS BY MOUTH EVERY 12 HOURS TO  PREVENT COUGH OR WHEEZING *RINSE MOUTH AFTER USING* 05/21/18   Pleas Koch, NP  busPIRone (BUSPAR) 30 MG tablet Take 30 mg by mouth 2 (two) times daily.    [provider]  famotidine (PEPCID) 20 MG tablet TAKE 1 TABLET BY MOUTH TWICE (2) DAILY FOR ACID INDIGESTION 05/04/18   Pleas Koch, NP  FLUoxetine (PROZAC) 40 MG capsule  07/25/18   [provider]  glucose blood (ONE TOUCH ULTRA TEST) test strip Used to check blood sugars 4x daily. 08/23/17   Renato Shin, MD  Insulin Pen Needle 32G X 4 MM MISC Used to give insulin injections twice daily. 08/23/17   Renato Shin, MD  insulin regular human CONCENTRATED (HUMULIN R U-500 KWIKPEN) 500 UNIT/ML kwikpen Inject 150 Units into the skin 2 (two) times daily with a meal. 05/22/18   Renato Shin, MD  ipratropium-albuterol (DUONEB) 0.5-2.5 (3) MG/3ML SOLN Take 3 mLs by nebulization every 4 (four) hours as needed (wheezing/shortness of breath). 08/02/17   Pleas Koch, NP  linaclotide Rolan Lipa) 290 MCG CAPS capsule Take 1 capsule (290 mcg total) by mouth daily before breakfast. 06/06/17 01/21/18  Jonathon Bellows, MD  loperamide (IMODIUM A-D) 2 MG tablet Take 4 mg by mouth 3 (three) times daily as needed for diarrhea or loose stools.    [provider]  metFORMIN (GLUCOPHAGE) 1000 MG tablet Take 1,000 mg by mouth 2 (two) times daily with a meal.    [provider]  metolazone (ZAROXOLYN) 2.5 MG tablet Take 2.5 mg by mouth every other day. Take 2.5 mg by mouth every 48 hours on Tuesday, Thursday and Saturday    [provider]  nystatin (MYCOSTATIN) 100000 UNIT/ML suspension Swish, gargle and swallow 5 mls in the mouth four times daily. 08/07/18   Pleas Koch, NP  OXYGEN Inhale 4 L into the lungs at bedtime.     [provider]  pregabalin (LYRICA) 300 MG capsule Take 1 capsule (300 mg total) by mouth 3 (three) times daily. For neuropathy 06/15/18 09/13/18  Pleas Koch, NP    PRESCRIPTION MEDICATION Place 1 drop into both eyes daily as needed (every 14 weeks before injections).    [provider]  ranitidine (ZANTAC) 150 MG tablet Take 150 mg by mouth 2 (two) times daily.     [provider]  rOPINIRole (REQUIP) 1 MG tablet Take 1 tablet (1 mg total) by mouth at bedtime. For restless legs. 06/21/18   Pleas Koch, NP  spironolactone (ALDACTONE) 50 MG tablet TAKE 1 TABLET  BY MOUTH ONCE DAILY 08/27/18   Adrian Prows, MD  torsemide (DEMADEX) 10 MG tablet Take 3 tablets by mouth once daily for swelling and fluid retention. Patient taking differently: Take 20-40 mg by mouth 2 (two) times daily. 40 mg in the morning and 20 mg in the evening 04/04/17   Pleas Koch, NP  traZODone (DESYREL) 50 MG tablet Take 50-150 mg by mouth at bedtime.    [provider]  ULTICARE MINI PEN NEEDLES 31G X 6 MM MISC USE AS DIRECTED THREE TIMES DAILY 07/25/18   Renato Shin, MD    Family History Family History  Problem Relation Age of Onset  . Venous thrombosis Brother   . Other Brother        BRAIN TUMOR  . Asthma Father   . Diabetes Father   . Coronary artery disease Mother   . Hypertension Mother   . Diabetes Mother   . Asthma Sister   . Diabetes type II Brother     Social History Social History   Tobacco Use  . Smoking status: Current Every Day Smoker    Packs/day: 1.00    Years: 36.00    Pack years: 36.00    Types: Cigarettes    Last attempt to quit: 01/18/2018    Years since quitting: 0.6  . Smokeless tobacco: Never Used  Substance Use Topics  . Alcohol use: No    Frequency: Never  . Drug use: No     Allergies   Adhesive [tape]; Metoprolol; Montelukast; Morphine sulfate; Penicillins; Prednisone; Diltiazem; and Gabapentin   Review of Systems Review of Systems  Constitutional: Negative for fever.  HENT: Negative for sore throat.   Eyes: Negative for visual disturbance.  Respiratory: Negative for shortness of breath.    Cardiovascular: Negative for chest pain.  Gastrointestinal: Negative for abdominal pain.  Genitourinary: Negative for dysuria.  Musculoskeletal: Negative for neck pain.  Skin: Negative for rash.  Neurological: Positive for headaches.     Physical Exam Updated Vital Signs BP 122/60   Temp 97.6 F (36.4 C) (Oral)   Resp 19   LMP 11/11/2011   Physical Exam Vitals signs and nursing note reviewed.  Constitutional:      General: She is not in acute distress.    Appearance: She is well-developed.  HENT:     Head: Normocephalic and atraumatic.     Mouth/Throat:     Mouth: Mucous membranes are moist.     Pharynx: Oropharynx is clear.  Eyes:     Conjunctiva/sclera: Conjunctivae normal.  Neck:     Musculoskeletal: Neck supple.  Cardiovascular:     Rate and Rhythm: Normal rate and regular rhythm.     Heart sounds: No murmur.  Pulmonary:     Effort: Pulmonary effort is normal. No respiratory distress.     Breath sounds: Normal breath sounds.  Abdominal:     Palpations: Abdomen is soft.     Tenderness: There is no abdominal tenderness.  Musculoskeletal:        General: No signs of injury.     Right lower leg: Edema present.     Left lower leg: Edema present.  Skin:    General: Skin is warm and dry.     Capillary Refill: Capillary refill takes less than 2 seconds.  Neurological:     Mental Status: She is alert.     Sensory: No sensory deficit.     Comments: Patient is a very difficult neurologic exam.  She has some  stuttering almost childlike speech but not really dysarthric.  No gross cranial nerve deformities otherwise.  She will give a good effort initially for strength but then seems to give away.  Same with pronator drift on the left.  Possible left arm weakness although inconsistent.  Same with the left leg.      ED Treatments / Results  Labs (all labs ordered are listed, but only abnormal results are displayed) Labs Reviewed  CBC - Abnormal; Notable for the  following components:      Result Value   WBC 12.4 (*)    Hemoglobin 10.6 (*)    MCV 78.5 (*)    MCH 23.0 (*)    MCHC 29.3 (*)    RDW 21.9 (*)    All other components within normal limits  DIFFERENTIAL - Abnormal; Notable for the following components:   Neutro Abs 10.3 (*)    Abs Immature Granulocytes 0.08 (*)    All other components within normal limits  COMPREHENSIVE METABOLIC PANEL - Abnormal; Notable for the following components:   Sodium 134 (*)    Glucose, Bld 159 (*)    BUN 23 (*)    Creatinine, Ser 1.19 (*)    Total Protein 8.4 (*)    Albumin 3.4 (*)    Total Bilirubin 1.5 (*)    GFR calc non Af Amer 54 (*)    All other components within normal limits  I-STAT BETA HCG BLOOD, ED ([facility], [facility], AP ONLY) - Abnormal; Notable for the following components:   I-stat hCG, quantitative 5.6 (*)    All other components within normal limits  POCT I-STAT 7, (LYTES, BLD GAS, ICA,H+H) - Abnormal; Notable for the following components:   pO2, Arterial 55.0 (*)    Bicarbonate 28.4 (*)    Acid-Base Excess 4.0 (*)    HCT 32.0 (*)    Hemoglobin 10.9 (*)    All other components within normal limits  ETHANOL  PROTIME-INR  APTT  RAPID URINE DRUG SCREEN, HOSP PERFORMED  URINALYSIS, ROUTINE W REFLEX MICROSCOPIC  I-STAT ARTERIAL BLOOD GAS, ED    EKG EKG Interpretation  Date/Time:  Thursday September 13 2018 09:59:05 EST Ventricular Rate:  81 PR Interval:    QRS Duration: 127 QT Interval:  431 QTC Calculation: 501 R Axis:   -41 Text Interpretation:  Sinus rhythm RBBB and LAFB similar to prior 7/19 Confirmed by Aletta Edouard [phone]) on 09/13/2018 10:08:21 AM   Radiology Ct Head Wo Contrast  Result Date: 09/13/2018 CLINICAL DATA:  Slurred speech.  Left-sided weakness. EXAM: CT HEAD WITHOUT CONTRAST TECHNIQUE: Contiguous axial images were obtained from the base of the skull through the vertex without intravenous contrast. COMPARISON:  03/17/2017 FINDINGS: Brain: No evidence of acute  infarction, hemorrhage, hydrocephalus, extra-axial collection or mass lesion/mass effect. Vascular: No hyperdense vessel or unexpected calcification. Skull: Normal. Negative for fracture or focal lesion. Sinuses/Orbits: No acute finding. Other: Metallic BB within left side of scalp is again noted IMPRESSION: 1. No acute intracranial abnormality.  Negative head CT Electronically Signed   By: Kerby Moors M.D.   On: 09/13/2018 12:18    Procedures .Critical Care Performed by: Hayden Rasmussen, MD Authorized by: Hayden Rasmussen, MD   Critical care provider statement:    Critical care time (minutes):  45   Critical care time was exclusive of:  Separately billable procedures and treating other patients   Critical care was necessary to treat or prevent imminent or life-threatening deterioration of the following conditions:  CNS failure or compromise   Critical care was time spent personally by me on the following activities:  Discussions with consultants, evaluation of patient's response to treatment, examination of patient, ordering and performing treatments and interventions, ordering and review of laboratory studies, ordering and review of radiographic studies, pulse oximetry, re-evaluation of patient's condition, obtaining history from patient or surrogate, review of old charts and development of treatment plan with patient or surrogate   I assumed direction of critical care for this patient from another provider in my specialty: no     (including critical care time)  Medications Ordered in ED Medications - No data to display   Initial Impression / Assessment and Plan / ED Course  I have reviewed the triage vital signs and the nursing notes.  Pertinent labs & imaging results that were available during my care of the patient were reviewed by me and considered in my medical decision making (see chart for details).  Clinical Course as of Sep 12 1521  Thu Sep 13, 2018  1021 I reviewed my  findings with Dr. Malen Gauze from neurology on-call.  He agrees with current work-up although he said her exam sounds very inconsistent and may be related to more toxic metabolic or even psychogenic. Will reconnect after initial workup back.    [MB]  2440 Reviewed prior clinic notes and she has been having left leg weakness for a while that is been progressive and she has been referred on to neurosurgery for an evaluation.   [MB]  1240 Reviewed work-up with Dr. Rory Percy.  He will get her set up for an EEG but he feels she will need to be admitted for further work-up.  Patient is agreeable to plan.  Still having a headache and have ordered her migraine cocktail.  Her CT imaging is unremarkable other than the BB in place.   [MB]  1027 Discussed with [facility] hospitalist who will evaluate the patient for admission.   [MB]  2536 Patient seen by Dr. Malen Gauze.  He is asking if we can check an ABG on her may be some element of retention.   [MB]    Clinical Course User Index [MB] Hayden Rasmussen, MD        Final Clinical Impressions(s) / ED Diagnoses   Final diagnoses:  Slurred speech  Left arm weakness    ED Discharge Orders    None       Hayden Rasmussen, MD 09/13/18 1524

## 2018-09-14 ENCOUNTER — Observation Stay (HOSPITAL_COMMUNITY): Payer: BLUE CROSS/BLUE SHIELD

## 2018-09-14 ENCOUNTER — Encounter (HOSPITAL_COMMUNITY): Payer: Self-pay | Admitting: *Deleted

## 2018-09-14 ENCOUNTER — Observation Stay (HOSPITAL_BASED_OUTPATIENT_CLINIC_OR_DEPARTMENT_OTHER): Payer: BLUE CROSS/BLUE SHIELD

## 2018-09-14 DIAGNOSIS — R29818 Other symptoms and signs involving the nervous system: Secondary | ICD-10-CM | POA: Diagnosis not present

## 2018-09-14 DIAGNOSIS — G459 Transient cerebral ischemic attack, unspecified: Secondary | ICD-10-CM | POA: Diagnosis not present

## 2018-09-14 DIAGNOSIS — E1165 Type 2 diabetes mellitus with hyperglycemia: Secondary | ICD-10-CM

## 2018-09-14 DIAGNOSIS — I6389 Other cerebral infarction: Secondary | ICD-10-CM

## 2018-09-14 LAB — RAPID URINE DRUG SCREEN, HOSP PERFORMED
Amphetamines: NOT DETECTED
Barbiturates: NOT DETECTED
Benzodiazepines: POSITIVE — AB
Cocaine: NOT DETECTED
Opiates: NOT DETECTED
Tetrahydrocannabinol: NOT DETECTED

## 2018-09-14 LAB — HEMOGLOBIN A1C
Hgb A1c MFr Bld: 10.6 % — ABNORMAL HIGH (ref 4.8–5.6)
Mean Plasma Glucose: 257.52 mg/dL

## 2018-09-14 LAB — GLUCOSE, CAPILLARY
Glucose-Capillary: 264 mg/dL — ABNORMAL HIGH (ref 70–99)
Glucose-Capillary: 363 mg/dL — ABNORMAL HIGH (ref 70–99)

## 2018-09-14 LAB — LIPID PANEL
Cholesterol: 146 mg/dL (ref 0–200)
HDL: 21 mg/dL — ABNORMAL LOW (ref >40)
LDL Cholesterol: 98 mg/dL (ref 0–99)
Total CHOL/HDL Ratio: 7 RATIO
Triglycerides: 135 mg/dL (ref <150)
VLDL: 27 mg/dL (ref 0–40)

## 2018-09-14 LAB — URINALYSIS, ROUTINE W REFLEX MICROSCOPIC
Bilirubin Urine: NEGATIVE
Glucose, UA: 150 mg/dL — AB
Hgb urine dipstick: NEGATIVE
Ketones, ur: NEGATIVE mg/dL
Leukocytes,Ua: NEGATIVE
Nitrite: NEGATIVE
Protein, ur: NEGATIVE mg/dL
Specific Gravity, Urine: 1.009 (ref 1.005–1.030)
pH: 5 (ref 5.0–8.0)

## 2018-09-14 LAB — ECHOCARDIOGRAM COMPLETE
Height: 64 in
Weight: 4800 oz

## 2018-09-14 MED ORDER — PERFLUTREN LIPID MICROSPHERE
1.0000 mL | INTRAVENOUS | Status: AC | PRN
  Administered 2018-09-14: 2 mL via INTRAVENOUS
  Filled 2018-09-14: qty 10

## 2018-09-14 MED ORDER — PERFLUTREN LIPID MICROSPHERE
1.0000 mL | INTRAVENOUS | Status: AC | PRN
  Filled 2018-09-14: qty 10

## 2018-09-14 MED ORDER — ALPRAZOLAM 0.5 MG PO TABS: 0.5000 mg | ORAL_TABLET | Freq: Two times a day (BID) | ORAL | 0 refills | Status: DC | PRN

## 2018-09-14 NOTE — Progress Notes (Signed)
Pt denied having any more HA.  Ave Filter, RN
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
## 2018-09-14 NOTE — Progress Notes (Signed)
2D Echocardiogram has been performed.  [name] 09/14/2018, 9:05 AM
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
## 2018-09-14 NOTE — Discharge Planning (Signed)
Patient understanding of discharge paperwork. No questions patient to return home with significant other. Nursing staff wheeled patient to front entrance of hospital.

## 2018-09-20 ENCOUNTER — Telehealth: Payer: Self-pay | Admitting: Endocrinology

## 2018-09-20 ENCOUNTER — Other Ambulatory Visit: Payer: Self-pay | Admitting: Endocrinology

## 2018-09-20 NOTE — Telephone Encounter (Signed)
Patient stated that the pharmacy sent the refill request to our office bu Dr Loanne Drilling declined the refill  Patient would like to know why these were denied.

## 2018-09-20 NOTE — Telephone Encounter (Signed)
Called pt and informed the reason for the denial is because she is overdue for an office visit. Scheduled to be seen tomorrow at 8:45am

## 2018-09-21 ENCOUNTER — Other Ambulatory Visit: Payer: Self-pay

## 2018-09-21 ENCOUNTER — Encounter: Payer: Self-pay | Admitting: Endocrinology

## 2018-09-21 ENCOUNTER — Ambulatory Visit (INDEPENDENT_AMBULATORY_CARE_PROVIDER_SITE_OTHER): Payer: BLUE CROSS/BLUE SHIELD | Admitting: Endocrinology

## 2018-09-21 VITALS — BP 104/60 | HR 76 | Ht 64.0 in | Wt 294.6 lb

## 2018-09-21 DIAGNOSIS — E1122 Type 2 diabetes mellitus with diabetic chronic kidney disease: Secondary | ICD-10-CM

## 2018-09-21 DIAGNOSIS — E1142 Type 2 diabetes mellitus with diabetic polyneuropathy: Secondary | ICD-10-CM

## 2018-09-21 DIAGNOSIS — E11319 Type 2 diabetes mellitus with unspecified diabetic retinopathy without macular edema: Secondary | ICD-10-CM | POA: Diagnosis not present

## 2018-09-21 DIAGNOSIS — E0822 Diabetes mellitus due to underlying condition with diabetic chronic kidney disease: Secondary | ICD-10-CM

## 2018-09-21 DIAGNOSIS — R609 Edema, unspecified: Secondary | ICD-10-CM | POA: Diagnosis not present

## 2018-09-21 DIAGNOSIS — N182 Chronic kidney disease, stage 2 (mild): Secondary | ICD-10-CM

## 2018-09-21 DIAGNOSIS — Z794 Long term (current) use of insulin: Secondary | ICD-10-CM | POA: Diagnosis not present

## 2018-09-21 MED ORDER — INSULIN REGULAR HUMAN (CONC) 500 UNIT/ML ~~LOC~~ SOPN: 200.0000 [IU] | PEN_INJECTOR | Freq: Two times a day (BID) | SUBCUTANEOUS | 11 refills | Status: DC

## 2018-09-21 NOTE — Patient Instructions (Addendum)
check your blood sugar twice a day.  vary the time of day when you check, between before the 3 meals, and at bedtime.  also check if you have symptoms of your blood sugar being too high or too low.  please keep a record of the readings and bring it to your next appointment here (or you can bring the meter itself).  You can write it on any piece of paper.  please call us sooner if your blood sugar goes below 70, or if you have a lot of readings over 200. Please increase the insulin to 200 units with breakfast and 200 units with supper, no matter what you blood sugar is. Please call or message [place] next week, to tell us how the blood sugar is doing.   Please come back for a follow-up appointment in 6 weeks. We can add the Ozempic if we need to.
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
## 2018-09-24 ENCOUNTER — Telehealth: Payer: Self-pay | Admitting: *Deleted

## 2018-09-24 NOTE — Telephone Encounter (Signed)
Cindy left a voicemail requesting a verbal order to continue home therapy services for PT.

## 2018-09-24 NOTE — Telephone Encounter (Signed)
Approved.  

## 2018-09-25 ENCOUNTER — Ambulatory Visit (INDEPENDENT_AMBULATORY_CARE_PROVIDER_SITE_OTHER): Payer: BLUE CROSS/BLUE SHIELD | Admitting: Vascular Surgery

## 2018-09-25 ENCOUNTER — Other Ambulatory Visit: Payer: Self-pay

## 2018-09-25 ENCOUNTER — Encounter: Payer: Self-pay | Admitting: Vascular Surgery

## 2018-09-25 VITALS — BP 112/66 | HR 76 | Temp 97.0°F | Resp 20 | Ht 64.0 in | Wt 294.0 lb

## 2018-09-25 DIAGNOSIS — M7989 Other specified soft tissue disorders: Secondary | ICD-10-CM | POA: Diagnosis not present

## 2018-09-25 NOTE — Telephone Encounter (Signed)
Message left for Cindy to return my call.  

## 2018-09-25 NOTE — Telephone Encounter (Signed)
Cindy advised 

## 2018-09-25 NOTE — Progress Notes (Signed)
Vascular and Vein Specialist of [facility]  Patient name: [name] MRN: [id number] DOB: 1971/06/29 Sex: female  REASON FOR CONSULT: Evaluation lower extremity swelling and rule out arterial insufficiency  HPI: Luana Tatro is a 48 y.o. female, who is here today for evaluation of lower extremity swelling and pain.  She has a multitude of medical problems as outlined below.  She is in a wheelchair today and reports that she is able to walk with a walker around the home but when that she is out she is in a wheelchair.  She reports pain in her feet and legs that is chronic and not related to walking.  She has marked swelling in both legs as well.  She does not walk to the level of claudication.  No history of DVT.  Does have history of congestive heart failur  Past Medical History:  Diagnosis Date  . Anxiety    takes Prozac daily  . Anxiety   . Aortic valve calcification   . Asthma    Advair and Spirva daily  . Asthma   . Bipolar disorder ([place])   . CAD (coronary artery disease)    a. LHC 11/2013 done for CP/fluid retention: mild disease in prox LAD, mild-mod disease in mRCA, EF 60% with normal LVEDP. b. Normal nuc 03/2016.  Marland Kitchen CHF (congestive heart failure) ([place])   . Chronic diastolic CHF (congestive heart failure) ([place])   . CKD (chronic kidney disease), stage II   . COPD (chronic obstructive pulmonary disease) (HCC)    a. nocturnal O2.  Marland Kitchen COPD (chronic obstructive pulmonary disease) ([place])   . Coronary artery disease   . Decreased urine stream   . Diabetes mellitus   . Diabetes mellitus without complication ([place])   . Dyspnea   . Family history of adverse reaction to anesthesia    mom gets nauseated  . GERD (gastroesophageal reflux disease)    takes Pepcid daily  . History of blood clots    left leg 3-69yrs ago  . Hyperlipidemia   . Hypertension   . Hypertriglyceridemia   . Inguinal hernia, left 01/2015  . Muscle spasm   . Open wound of  genital labia   . Peripheral neuropathy   . RBBB   . Seizures ([place])   . Sepsis ([place]) 01/19/2018  . Sinus tachycardia    a. persistent since 2009.  Marland Kitchen Smokers' cough ([place])   . Stroke [facility]) 1989   left sided weakness  . TIA (transient ischemic attack)   . Tobacco abuse   . Vulvar abscess 01/23/2018    Family History  Problem Relation Age of Onset  . Venous thrombosis Brother   . Other Brother        BRAIN TUMOR  . Asthma Father   . Diabetes Father   . Coronary artery disease Mother   . Hypertension Mother   . Diabetes Mother   . Asthma Sister   . Diabetes type II Brother     SOCIAL HISTORY: Social History   Socioeconomic History  . Marital status: Married    Spouse name: Not on file  . Number of children: Not on file  . Years of education: Not on file  . Highest education level: Not on file  Occupational History  . Not on file  Social Needs  . Financial resource strain: Not on file  . Food insecurity:    Worry: Not on file    Inability: Not on file  . Transportation needs:  Medical: Not on file    Non-medical: Not on file  Tobacco Use  . Smoking status: Current Every Day Smoker    Packs/day: 1.00    Years: 36.00    Pack years: 36.00    Types: Cigarettes    Last attempt to quit: 01/18/2018    Years since quitting: 0.6  . Smokeless tobacco: Never Used  Substance and Sexual Activity  . Alcohol use: No    Frequency: Never  . Drug use: No  . Sexual activity: Not Currently  Lifestyle  . Physical activity:    Days per week: Patient refused    Minutes per session: Patient refused  . Stress: Only a little  Relationships  . Social connections:    Talks on phone: More than three times a week    Gets together: Patient refused    Attends religious service: Patient refused    Active member of club or organization: Patient refused    Attends meetings of clubs or organizations: Patient refused    Relationship status: Married  . Intimate partner violence:     Fear of current or ex partner: Patient refused    Emotionally abused: Patient refused    Physically abused: Patient refused    Forced sexual activity: Patient refused  Other Topics Concern  . Not on file  Social History Narrative   ** Merged History Encounter **       Lives in [place]   Married but lives with Boyfriend - not legally separated   Disabled - for BiPolar, Seizure disorder, diabetes   Formerly worked at [employer]          Allergies  Allergen Reactions  . Adhesive [Tape] Rash and Other (See Comments)    TAKES OFF THE SKIN (CERTAIN MEDICAL TAPES DO THIS!!)  . Metoprolol Shortness Of Breath    Occurrence of shortness of breath after 3 days  . Montelukast Shortness Of Breath  . Morphine Sulfate Anaphylaxis, Shortness Of Breath and Nausea And Vomiting    Swollen Throat - Able to tolerate dilaudid  . Penicillins Anaphylaxis, Hives and Shortness Of Breath    Throat swells Has patient had a PCN reaction causing immediate rash, facial/tongue/throat swelling, SOB or lightheadedness with hypotension: Yes Has patient had a PCN reaction causing severe rash involving mucus membranes or skin necrosis: No Has patient had a PCN reaction that required hospitalization: Yes Has patient had a PCN reaction occurring within the last 10 years: No If all of the above answers are "NO", then may proceed with Cephalosporin use.   . Prednisone Anaphylaxis  . Diltiazem Swelling  . Gabapentin Swelling    Current Outpatient Medications  Medication Sig Dispense Refill  . acetaminophen (TYLENOL) 500 MG tablet Take 500 mg by mouth every 4 (four) hours as needed.    Marland Kitchen albuterol (PROVENTIL HFA;VENTOLIN HFA) 108 (90 Base) MCG/ACT inhaler INHALE 2 PUFFS INTO LUNGS EVERY 4 HOURS AS NEEDED FOR SHORTNESS OF BREATH 6.7 g 0  . ALPRAZolam (XANAX) 0.5 MG tablet Take 1 tablet (0.5 mg total) by mouth 2 (two) times daily as needed for anxiety.  0  . aspirin EC 81 MG tablet Take 81 mg by mouth every morning.     . budesonide (PULMICORT) 0.5 MG/2ML nebulizer solution Take 0.5 mg by nebulization 2 (two) times daily.    . budesonide-formoterol (SYMBICORT) 80-4.5 MCG/ACT inhaler INHALE 2 PUFFS BY MOUTH EVERY 12 HOURS TO PREVENT COUGH OR WHEEZING *RINSE MOUTH AFTER USING* 10.2 g 2  . busPIRone (BUSPAR)  30 MG tablet Take 30 mg by mouth 2 (two) times daily.    . famotidine (PEPCID) 20 MG tablet TAKE 1 TABLET BY MOUTH TWICE (2) DAILY FOR ACID INDIGESTION (Patient taking differently: Take 20 mg by mouth 2 (two) times daily. ) 180 tablet 3  . FLUoxetine (PROZAC) 40 MG capsule Take 40 mg by mouth daily.     . Insulin Pen Needle 32G X 4 MM MISC Used to give insulin injections twice daily. 100 each 11  . insulin regular human CONCENTRATED (HUMULIN R U-500 KWIKPEN) 500 UNIT/ML kwikpen Inject 200 Units into the skin 2 (two) times daily with a meal. 10 pen 11  . ipratropium-albuterol (DUONEB) 0.5-2.5 (3) MG/3ML SOLN Take 3 mLs by nebulization every 4 (four) hours as needed (wheezing/shortness of breath). 360 mL 0  . loperamide (IMODIUM A-D) 2 MG tablet Take 4 mg by mouth 3 (three) times daily as needed for diarrhea or loose stools.    . metFORMIN (GLUCOPHAGE) 1000 MG tablet Take 1,000 mg by mouth 2 (two) times daily with a meal.    . metolazone (ZAROXOLYN) 2.5 MG tablet Take 2.5 mg by mouth every other day. Take 2.5 mg by mouth every 48 hours on Tuesday, Thursday and Saturday    . OXYGEN Inhale 4 L into the lungs at bedtime.     Marland Kitchen PRESCRIPTION MEDICATION Place 1 drop into both eyes daily as needed (every 14 weeks before injections).    . ranitidine (ZANTAC) 150 MG tablet Take 150 mg by mouth 2 (two) times daily.     Marland Kitchen rOPINIRole (REQUIP) 1 MG tablet Take 1 tablet (1 mg total) by mouth at bedtime. For restless legs. 90 tablet 1  . spironolactone (ALDACTONE) 50 MG tablet TAKE 1 TABLET BY MOUTH ONCE DAILY (Patient taking differently: Take 50 mg by mouth daily. ) 30 tablet 3  . torsemide (DEMADEX) 20 MG tablet Take 40 mg by  mouth 2 (two) times daily.    . traZODone (DESYREL) 50 MG tablet Take 50-150 mg by mouth at bedtime.    Marland Kitchen ULTICARE MINI PEN NEEDLES 31G X 6 MM MISC USE AS DIRECTED THREE TIMES DAILY 100 each 0  . linaclotide (LINZESS) 290 MCG CAPS capsule Take 1 capsule (290 mcg total) by mouth daily before breakfast. 90 capsule 0  . pregabalin (LYRICA) 300 MG capsule Take 1 capsule (300 mg total) by mouth 3 (three) times daily. For neuropathy 270 capsule 0   No current facility-administered medications for this visit.     REVIEW OF SYSTEMS:  [X]  denotes positive finding, [ ]  denotes negative finding Cardiac  Comments:  Chest pain or chest pressure:    Shortness of breath upon exertion: x   Short of breath when lying flat: x   Irregular heart rhythm:        Vascular    Pain in calf, thigh, or hip brought on by ambulation: x   Pain in feet at night that wakes you up from your sleep:  x   Blood clot in your veins:    Leg swelling:  x       Pulmonary    Oxygen at home: x   Productive cough:     Wheezing:  x       Neurologic    Sudden weakness in arms or legs:  x   Sudden numbness in arms or legs:  x   Sudden onset of difficulty speaking or slurred speech:    Temporary loss of vision in  one eye:     Problems with dizziness:  x       Gastrointestinal    Blood in stool:     Vomited blood:         Genitourinary    Burning when urinating:     Blood in urine:        Psychiatric    Major depression:         Hematologic    Bleeding problems:    Problems with blood clotting too easily: x       Skin    Rashes or ulcers:        Constitutional    Fever or chills:      PHYSICAL EXAM: Vitals:   09/25/18 0954  BP: 112/66  Pulse: 76  Resp: 20  Temp: (!) 97 F (36.1 C)  SpO2: 93%  Weight: 294 lb (133.4 kg)  Height: 5\' 4"  (1.626 m)    GENERAL: The patient is a well-nourished female, in no acute distress. The vital signs are documented above. CARDIOVASCULAR: 2+ radial and 2+ dorsalis  pedis pulses bilaterally.  Marked edema with thickening of her skin both lower extremities from her knees down to her ankle no venous ulcers. PULMONARY: There is good air exchange  ABDOMEN: Soft and non-tender  MUSCULOSKELETAL: There are no major deformities or cyanosis. NEUROLOGIC: No focal weakness or paresthesias are detected. SKIN: There are no ulcers or rashes noted. PSYCHIATRIC: The patient has a normal affect.  DATA:  Noninvasive studies from 08/22/2018 were reviewed.  This reveals normal ankle arm index bilaterally and normal toe brachial index bilaterally with biphasic pedal signals bilaterally  MEDICAL ISSUES: I discussed these findings at length with the patient and her husband present.  Explained that she does not have any evidence of arterial insufficiency.  Feel that her lower extremity discomfort in part is due to her edema.  She has very difficult time with elevation due to her obesity.  She also has a very difficult time and applying compression garments which she is attempted in the past.  I have recommended wrapping with a 6 inch Ace from her foot to her knee.  We have instructed her on this in the fitted her with a 6 inch Ace bilaterally today.  She will continue this and will see [place] again on an as-needed basis   Rosetta Posner, MD [facility] Vascular and Vein Specialists of [facility] Tel [phone] Pager [phone]

## 2018-09-26 ENCOUNTER — Other Ambulatory Visit: Payer: Self-pay

## 2018-09-26 ENCOUNTER — Telehealth: Payer: Self-pay | Admitting: Endocrinology

## 2018-09-26 MED ORDER — ONETOUCH DELICA LANCETS 33G MISC: 1.0000 | Freq: Three times a day (TID) | 11 refills | Status: DC

## 2018-09-26 MED ORDER — GLUCOSE BLOOD VI STRP: ORAL_STRIP | 12 refills | Status: DC

## 2018-09-26 NOTE — Telephone Encounter (Signed)
E-Prescribing Status: Receipt confirmed by pharmacy (09/26/2018 10:03 AM EDT)

## 2018-09-26 NOTE — Telephone Encounter (Signed)
Patient stated that at her last visit she was given the [facility] but did not receive any test strips for this.   She would like to make sure that it is enough strips for her to test 3x daily.          [place], [place]

## 2018-09-27 DIAGNOSIS — Z8673 Personal history of transient ischemic attack (TIA), and cerebral infarction without residual deficits: Secondary | ICD-10-CM | POA: Diagnosis not present

## 2018-09-27 DIAGNOSIS — Z9981 Dependence on supplemental oxygen: Secondary | ICD-10-CM | POA: Diagnosis not present

## 2018-09-27 DIAGNOSIS — Z794 Long term (current) use of insulin: Secondary | ICD-10-CM | POA: Diagnosis not present

## 2018-09-27 DIAGNOSIS — F319 Bipolar disorder, unspecified: Secondary | ICD-10-CM | POA: Diagnosis not present

## 2018-09-27 DIAGNOSIS — E1122 Type 2 diabetes mellitus with diabetic chronic kidney disease: Secondary | ICD-10-CM | POA: Diagnosis not present

## 2018-09-27 DIAGNOSIS — K219 Gastro-esophageal reflux disease without esophagitis: Secondary | ICD-10-CM | POA: Diagnosis not present

## 2018-09-27 DIAGNOSIS — I130 Hypertensive heart and chronic kidney disease with heart failure and stage 1 through stage 4 chronic kidney disease, or unspecified chronic kidney disease: Secondary | ICD-10-CM | POA: Diagnosis not present

## 2018-09-27 DIAGNOSIS — N182 Chronic kidney disease, stage 2 (mild): Secondary | ICD-10-CM | POA: Diagnosis not present

## 2018-09-27 DIAGNOSIS — I4510 Unspecified right bundle-branch block: Secondary | ICD-10-CM | POA: Diagnosis not present

## 2018-09-27 DIAGNOSIS — F17210 Nicotine dependence, cigarettes, uncomplicated: Secondary | ICD-10-CM | POA: Diagnosis not present

## 2018-09-27 DIAGNOSIS — E1142 Type 2 diabetes mellitus with diabetic polyneuropathy: Secondary | ICD-10-CM | POA: Diagnosis not present

## 2018-09-27 DIAGNOSIS — J449 Chronic obstructive pulmonary disease, unspecified: Secondary | ICD-10-CM | POA: Diagnosis not present

## 2018-09-27 DIAGNOSIS — I2510 Atherosclerotic heart disease of native coronary artery without angina pectoris: Secondary | ICD-10-CM | POA: Diagnosis not present

## 2018-09-27 DIAGNOSIS — F419 Anxiety disorder, unspecified: Secondary | ICD-10-CM | POA: Diagnosis not present

## 2018-09-27 DIAGNOSIS — I5032 Chronic diastolic (congestive) heart failure: Secondary | ICD-10-CM | POA: Diagnosis not present

## 2018-10-01 DIAGNOSIS — E1122 Type 2 diabetes mellitus with diabetic chronic kidney disease: Secondary | ICD-10-CM | POA: Diagnosis not present

## 2018-10-01 DIAGNOSIS — Z794 Long term (current) use of insulin: Secondary | ICD-10-CM | POA: Diagnosis not present

## 2018-10-01 DIAGNOSIS — Z9981 Dependence on supplemental oxygen: Secondary | ICD-10-CM | POA: Diagnosis not present

## 2018-10-01 DIAGNOSIS — J449 Chronic obstructive pulmonary disease, unspecified: Secondary | ICD-10-CM | POA: Diagnosis not present

## 2018-10-01 DIAGNOSIS — I5032 Chronic diastolic (congestive) heart failure: Secondary | ICD-10-CM | POA: Diagnosis not present

## 2018-10-01 DIAGNOSIS — N182 Chronic kidney disease, stage 2 (mild): Secondary | ICD-10-CM | POA: Diagnosis not present

## 2018-10-01 DIAGNOSIS — I2510 Atherosclerotic heart disease of native coronary artery without angina pectoris: Secondary | ICD-10-CM | POA: Diagnosis not present

## 2018-10-01 DIAGNOSIS — I130 Hypertensive heart and chronic kidney disease with heart failure and stage 1 through stage 4 chronic kidney disease, or unspecified chronic kidney disease: Secondary | ICD-10-CM | POA: Diagnosis not present

## 2018-10-01 DIAGNOSIS — Z8673 Personal history of transient ischemic attack (TIA), and cerebral infarction without residual deficits: Secondary | ICD-10-CM | POA: Diagnosis not present

## 2018-10-01 DIAGNOSIS — F17210 Nicotine dependence, cigarettes, uncomplicated: Secondary | ICD-10-CM | POA: Diagnosis not present

## 2018-10-01 DIAGNOSIS — E1142 Type 2 diabetes mellitus with diabetic polyneuropathy: Secondary | ICD-10-CM | POA: Diagnosis not present

## 2018-10-01 DIAGNOSIS — I4510 Unspecified right bundle-branch block: Secondary | ICD-10-CM | POA: Diagnosis not present

## 2018-10-01 DIAGNOSIS — F419 Anxiety disorder, unspecified: Secondary | ICD-10-CM | POA: Diagnosis not present

## 2018-10-01 DIAGNOSIS — F319 Bipolar disorder, unspecified: Secondary | ICD-10-CM | POA: Diagnosis not present

## 2018-10-01 DIAGNOSIS — K219 Gastro-esophageal reflux disease without esophagitis: Secondary | ICD-10-CM | POA: Diagnosis not present

## 2018-10-03 DIAGNOSIS — I5032 Chronic diastolic (congestive) heart failure: Secondary | ICD-10-CM | POA: Diagnosis not present

## 2018-10-03 DIAGNOSIS — K219 Gastro-esophageal reflux disease without esophagitis: Secondary | ICD-10-CM | POA: Diagnosis not present

## 2018-10-03 DIAGNOSIS — Z8673 Personal history of transient ischemic attack (TIA), and cerebral infarction without residual deficits: Secondary | ICD-10-CM | POA: Diagnosis not present

## 2018-10-03 DIAGNOSIS — E1142 Type 2 diabetes mellitus with diabetic polyneuropathy: Secondary | ICD-10-CM | POA: Diagnosis not present

## 2018-10-03 DIAGNOSIS — F319 Bipolar disorder, unspecified: Secondary | ICD-10-CM | POA: Diagnosis not present

## 2018-10-03 DIAGNOSIS — F419 Anxiety disorder, unspecified: Secondary | ICD-10-CM | POA: Diagnosis not present

## 2018-10-03 DIAGNOSIS — F17210 Nicotine dependence, cigarettes, uncomplicated: Secondary | ICD-10-CM | POA: Diagnosis not present

## 2018-10-03 DIAGNOSIS — I2510 Atherosclerotic heart disease of native coronary artery without angina pectoris: Secondary | ICD-10-CM | POA: Diagnosis not present

## 2018-10-03 DIAGNOSIS — Z9981 Dependence on supplemental oxygen: Secondary | ICD-10-CM | POA: Diagnosis not present

## 2018-10-03 DIAGNOSIS — I4510 Unspecified right bundle-branch block: Secondary | ICD-10-CM | POA: Diagnosis not present

## 2018-10-03 DIAGNOSIS — Z794 Long term (current) use of insulin: Secondary | ICD-10-CM | POA: Diagnosis not present

## 2018-10-03 DIAGNOSIS — E1122 Type 2 diabetes mellitus with diabetic chronic kidney disease: Secondary | ICD-10-CM | POA: Diagnosis not present

## 2018-10-03 DIAGNOSIS — J449 Chronic obstructive pulmonary disease, unspecified: Secondary | ICD-10-CM | POA: Diagnosis not present

## 2018-10-03 DIAGNOSIS — I130 Hypertensive heart and chronic kidney disease with heart failure and stage 1 through stage 4 chronic kidney disease, or unspecified chronic kidney disease: Secondary | ICD-10-CM | POA: Diagnosis not present

## 2018-10-03 DIAGNOSIS — N182 Chronic kidney disease, stage 2 (mild): Secondary | ICD-10-CM | POA: Diagnosis not present

## 2018-10-05 DIAGNOSIS — F410 Panic disorder [episodic paroxysmal anxiety] without agoraphobia: Secondary | ICD-10-CM | POA: Diagnosis not present

## 2018-10-05 DIAGNOSIS — F331 Major depressive disorder, recurrent, moderate: Secondary | ICD-10-CM | POA: Diagnosis not present

## 2018-10-15 DIAGNOSIS — E1142 Type 2 diabetes mellitus with diabetic polyneuropathy: Secondary | ICD-10-CM | POA: Diagnosis not present

## 2018-10-15 DIAGNOSIS — F319 Bipolar disorder, unspecified: Secondary | ICD-10-CM | POA: Diagnosis not present

## 2018-10-15 DIAGNOSIS — I5032 Chronic diastolic (congestive) heart failure: Secondary | ICD-10-CM | POA: Diagnosis not present

## 2018-10-15 DIAGNOSIS — I4510 Unspecified right bundle-branch block: Secondary | ICD-10-CM | POA: Diagnosis not present

## 2018-10-15 DIAGNOSIS — E1122 Type 2 diabetes mellitus with diabetic chronic kidney disease: Secondary | ICD-10-CM | POA: Diagnosis not present

## 2018-10-15 DIAGNOSIS — Z8673 Personal history of transient ischemic attack (TIA), and cerebral infarction without residual deficits: Secondary | ICD-10-CM | POA: Diagnosis not present

## 2018-10-15 DIAGNOSIS — Z794 Long term (current) use of insulin: Secondary | ICD-10-CM | POA: Diagnosis not present

## 2018-10-15 DIAGNOSIS — N182 Chronic kidney disease, stage 2 (mild): Secondary | ICD-10-CM | POA: Diagnosis not present

## 2018-10-15 DIAGNOSIS — K219 Gastro-esophageal reflux disease without esophagitis: Secondary | ICD-10-CM | POA: Diagnosis not present

## 2018-10-15 DIAGNOSIS — I2510 Atherosclerotic heart disease of native coronary artery without angina pectoris: Secondary | ICD-10-CM | POA: Diagnosis not present

## 2018-10-15 DIAGNOSIS — J449 Chronic obstructive pulmonary disease, unspecified: Secondary | ICD-10-CM | POA: Diagnosis not present

## 2018-10-15 DIAGNOSIS — F419 Anxiety disorder, unspecified: Secondary | ICD-10-CM | POA: Diagnosis not present

## 2018-10-15 DIAGNOSIS — Z9981 Dependence on supplemental oxygen: Secondary | ICD-10-CM | POA: Diagnosis not present

## 2018-10-15 DIAGNOSIS — I130 Hypertensive heart and chronic kidney disease with heart failure and stage 1 through stage 4 chronic kidney disease, or unspecified chronic kidney disease: Secondary | ICD-10-CM | POA: Diagnosis not present

## 2018-10-15 DIAGNOSIS — F17210 Nicotine dependence, cigarettes, uncomplicated: Secondary | ICD-10-CM | POA: Diagnosis not present

## 2018-10-15 IMAGING — CR DG LUMBAR SPINE COMPLETE W/ BEND
1 series · 7 of 7 positions shown · non-contrast
Comparison: None.

CLINICAL DATA: Chronic lumbago with bilateral radicular symptoms

EXAM:
LUMBAR SPINE - COMPLETE WITH BENDING VIEWS

[Series 1: dg lumbar spine complete w/bend 6+v · 0.14mm/px · 7 of 7 slices shown]
[im 1/7]
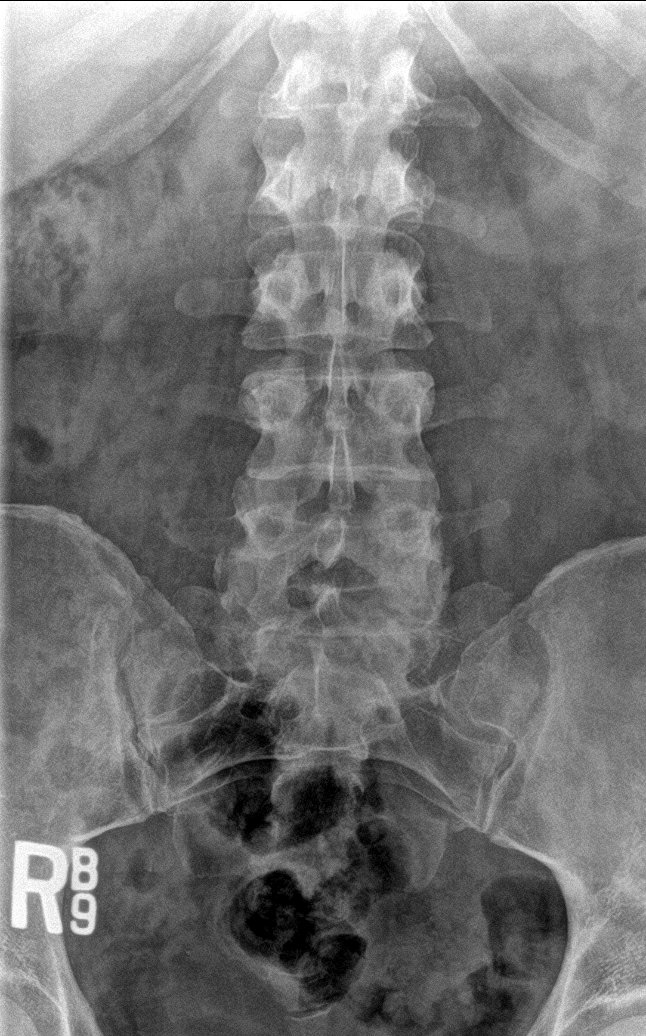
[im 2/7]
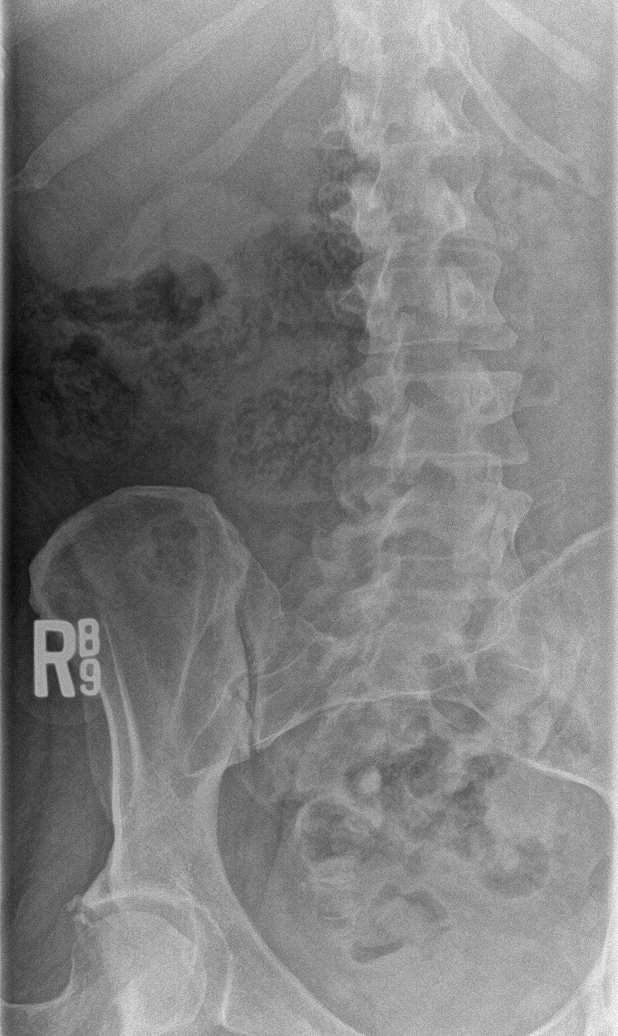
[im 3/7]
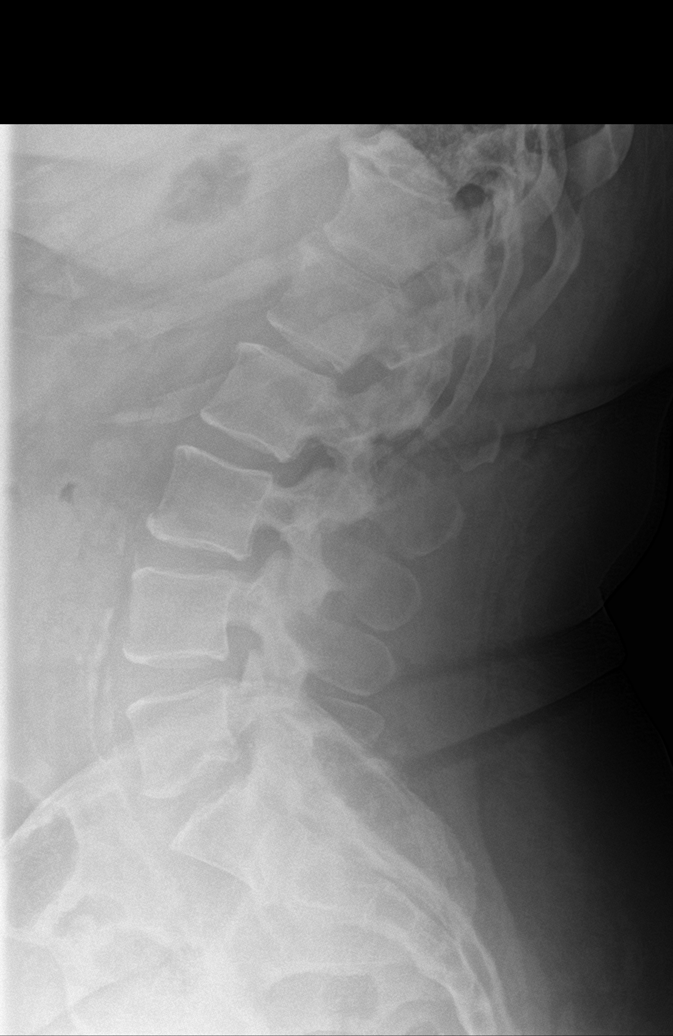
[im 4/7]
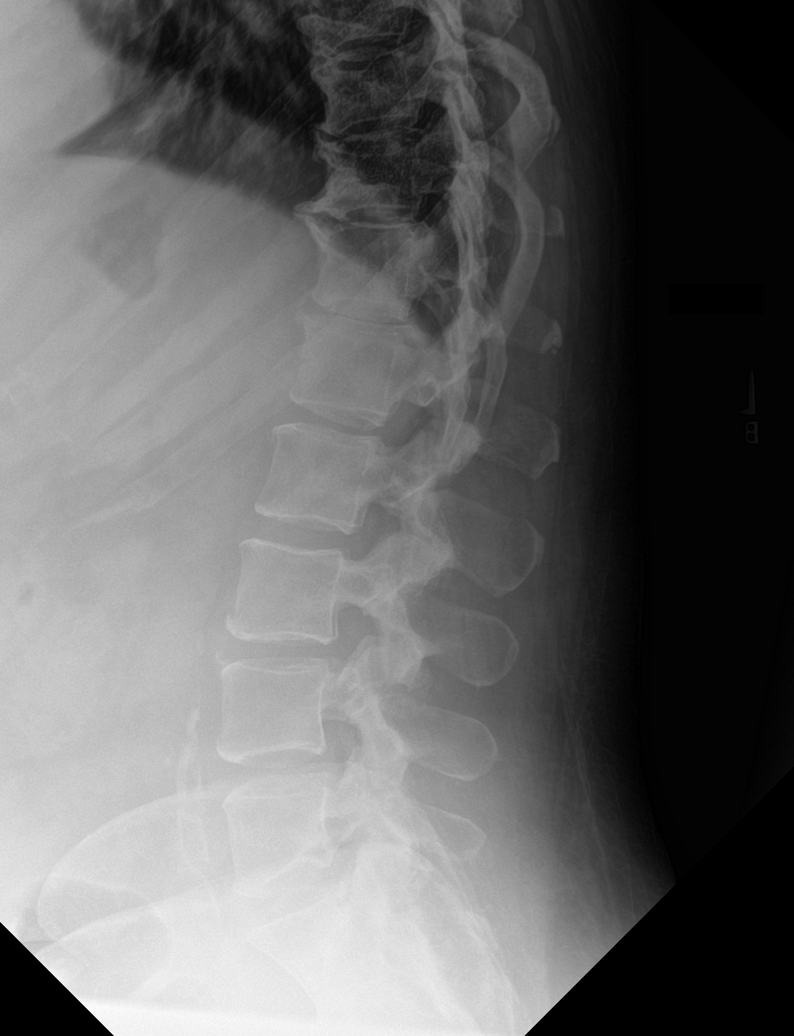
[im 5/7]
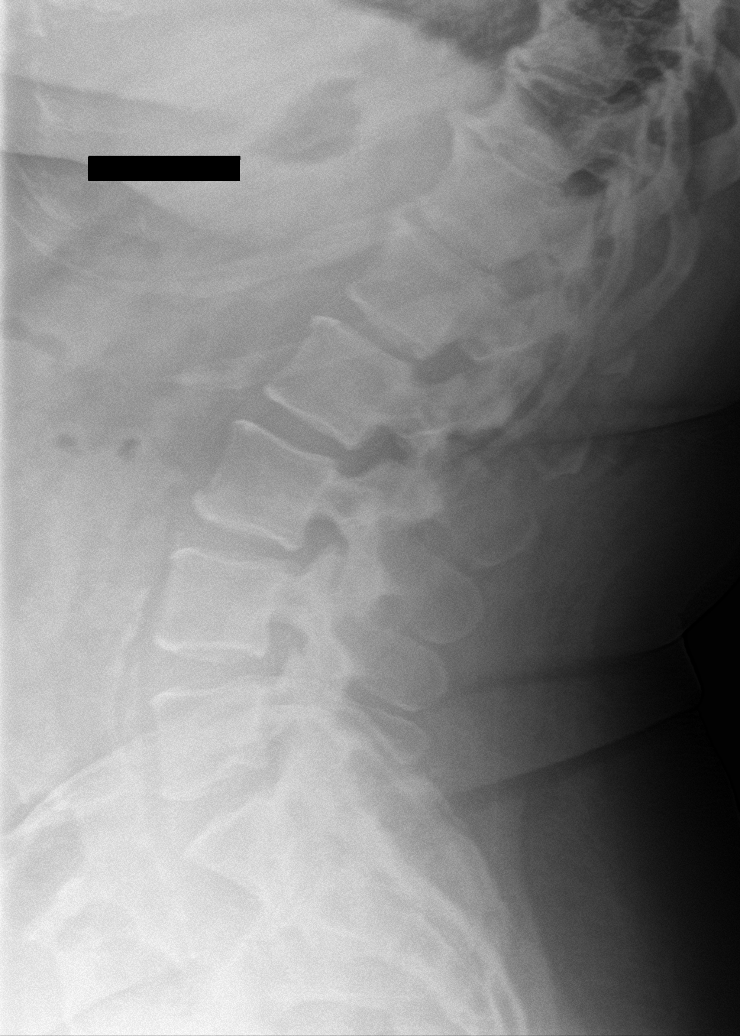
[im 6/7]
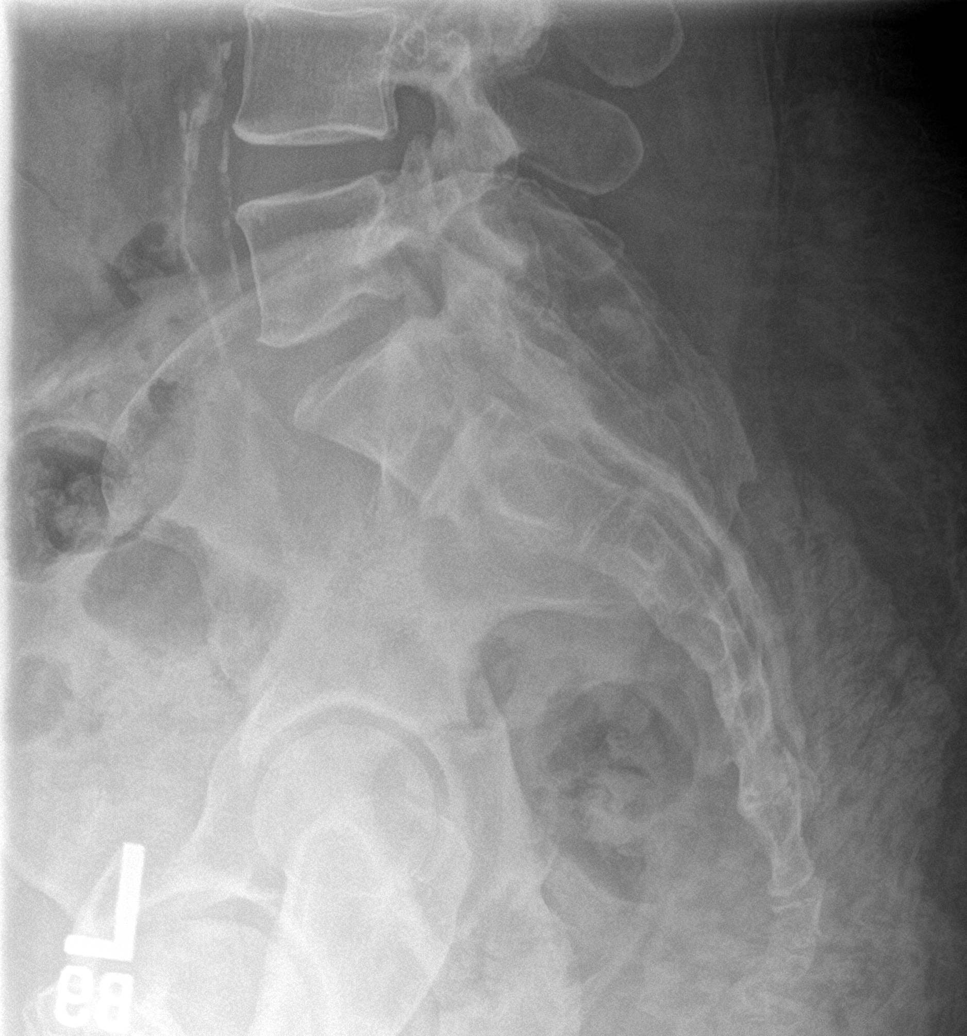
[im 7/7]
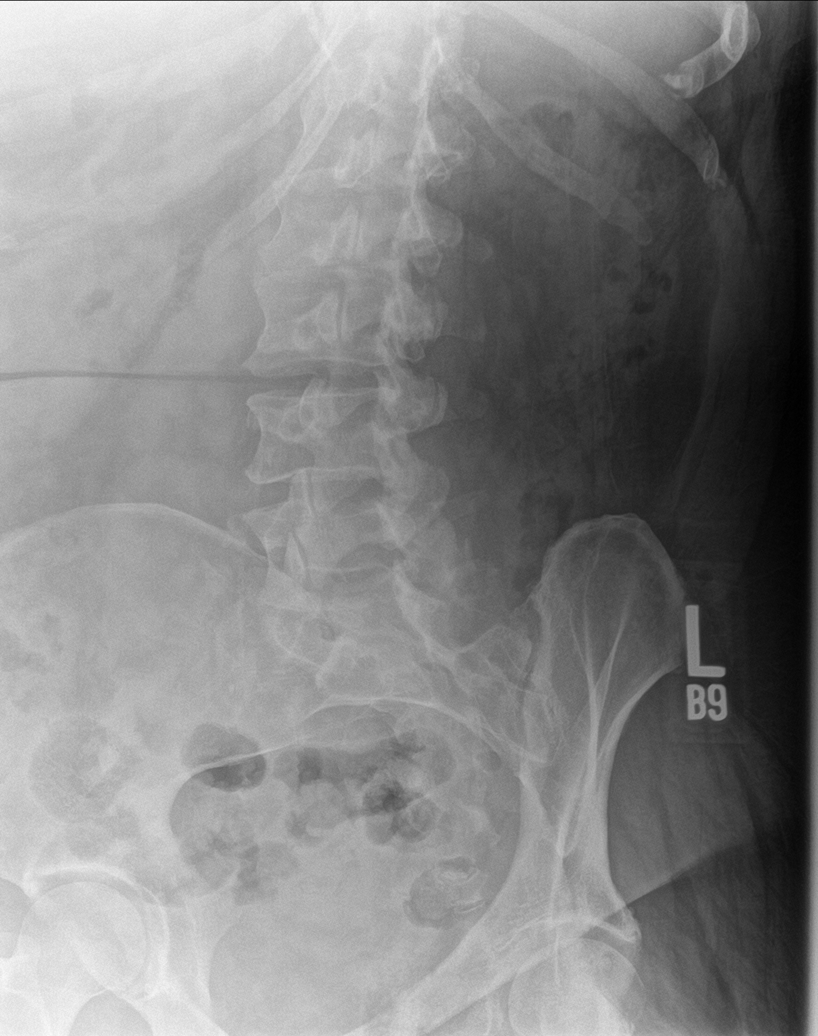

[7 of 7 positions shown; findings below may reference images not displayed]

FINDINGS: Frontal, neutral lateral, flexion lateral, extension lateral, spot
lumbosacral lateral, and bilateral oblique-total 7-views obtained.
There are 5 non-rib-bearing lumbar type vertebral bodies. Bony
mineralization is within normal limits.

There is no fracture. There is no spondylolisthesis on neutral
lateral imaging. There is no change in lateral alignment with
flexion-extension. Patient shows essentially normal range of motion
with flexion and extension. The disc spaces in the lumbar region
appear normal. There are anterior osteophytes at T10, T11, T12, and
L1. There is facet osteoarthritic change at L3-4, L4-5, and L5-S1 on
the left. Other facets appear unremarkable.

There is aortic atherosclerosis.
IMPRESSION: Areas of facet osteoarthritic change on the left. No fracture or
spondylolisthesis. No appreciable change in lateral alignment with
flexion-extension. No appreciable disc space narrowing in the lumbar
regions. There is aortic atherosclerosis.

Aortic Atherosclerosis (DN3ME-NRS.S).

## 2018-10-18 DIAGNOSIS — J449 Chronic obstructive pulmonary disease, unspecified: Secondary | ICD-10-CM | POA: Diagnosis not present

## 2018-10-18 DIAGNOSIS — Z9981 Dependence on supplemental oxygen: Secondary | ICD-10-CM | POA: Diagnosis not present

## 2018-10-18 DIAGNOSIS — F17210 Nicotine dependence, cigarettes, uncomplicated: Secondary | ICD-10-CM | POA: Diagnosis not present

## 2018-10-18 DIAGNOSIS — I130 Hypertensive heart and chronic kidney disease with heart failure and stage 1 through stage 4 chronic kidney disease, or unspecified chronic kidney disease: Secondary | ICD-10-CM | POA: Diagnosis not present

## 2018-10-18 DIAGNOSIS — F419 Anxiety disorder, unspecified: Secondary | ICD-10-CM | POA: Diagnosis not present

## 2018-10-18 DIAGNOSIS — Z8673 Personal history of transient ischemic attack (TIA), and cerebral infarction without residual deficits: Secondary | ICD-10-CM | POA: Diagnosis not present

## 2018-10-18 DIAGNOSIS — I2510 Atherosclerotic heart disease of native coronary artery without angina pectoris: Secondary | ICD-10-CM | POA: Diagnosis not present

## 2018-10-18 DIAGNOSIS — N182 Chronic kidney disease, stage 2 (mild): Secondary | ICD-10-CM | POA: Diagnosis not present

## 2018-10-18 DIAGNOSIS — I5032 Chronic diastolic (congestive) heart failure: Secondary | ICD-10-CM | POA: Diagnosis not present

## 2018-10-18 DIAGNOSIS — K219 Gastro-esophageal reflux disease without esophagitis: Secondary | ICD-10-CM | POA: Diagnosis not present

## 2018-10-18 DIAGNOSIS — Z794 Long term (current) use of insulin: Secondary | ICD-10-CM | POA: Diagnosis not present

## 2018-10-18 DIAGNOSIS — E1142 Type 2 diabetes mellitus with diabetic polyneuropathy: Secondary | ICD-10-CM | POA: Diagnosis not present

## 2018-10-18 DIAGNOSIS — I4510 Unspecified right bundle-branch block: Secondary | ICD-10-CM | POA: Diagnosis not present

## 2018-10-18 DIAGNOSIS — F319 Bipolar disorder, unspecified: Secondary | ICD-10-CM | POA: Diagnosis not present

## 2018-10-18 DIAGNOSIS — E1122 Type 2 diabetes mellitus with diabetic chronic kidney disease: Secondary | ICD-10-CM | POA: Diagnosis not present

## 2018-10-19 IMAGING — CT CT PELVIS W/O CM
2 of 3 series · 16 of 46 positions shown, 18 images · non-contrast
Comparison: 03/05/2010

CLINICAL DATA: Rash of the left side of the groin. Lump of the
vagina.

EXAM:
CT PELVIS WITHOUT CONTRAST
TECHNIQUE: Multidetector CT imaging of the pelvis was performed following the
standard protocol without intravenous contrast.

[Series 3: pelvis 2.0 st · axial · 0.91mm/px · z∈[+644,+964]mm · 13 of 184 slices shown, 15 images]
[im 12/184  soft-tissue]
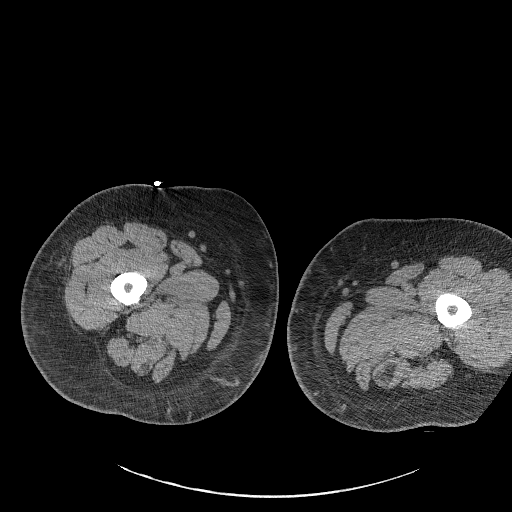
[im 12/184  bone]
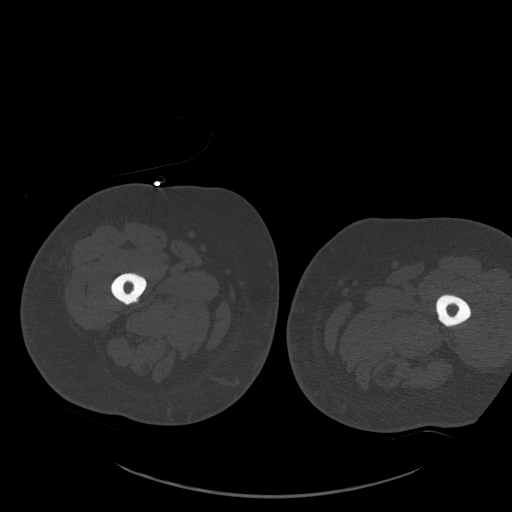
[im 24/184  soft-tissue]
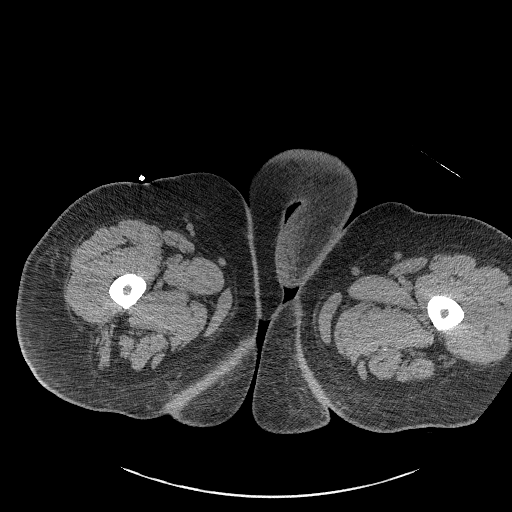
[im 36/184  soft-tissue]
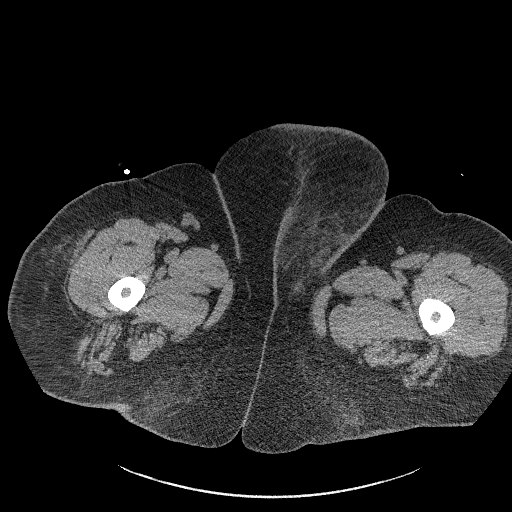
[im 54/184  soft-tissue]
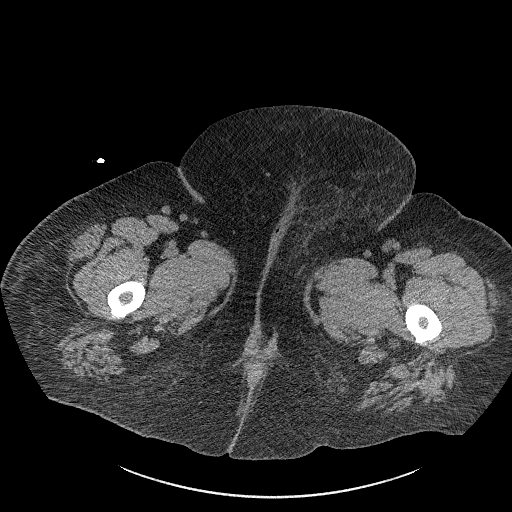
[im 65/184  soft-tissue]
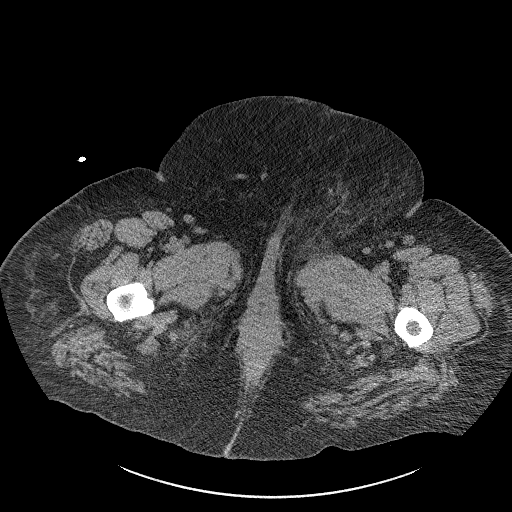
[im 77/184  soft-tissue]
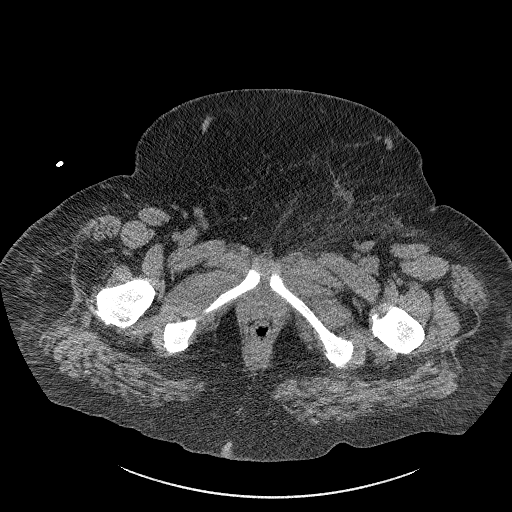
[im 95/184  soft-tissue]
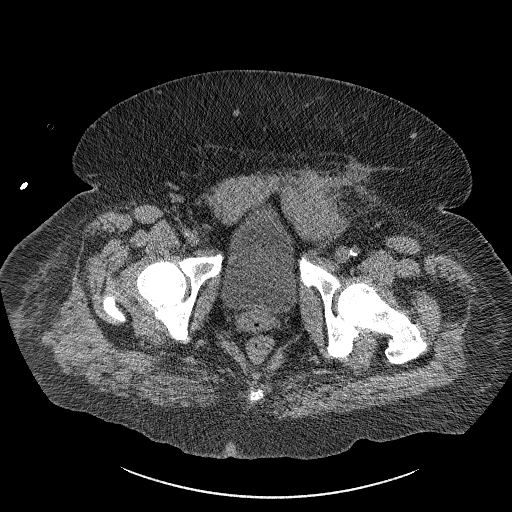
[im 107/184  soft-tissue]
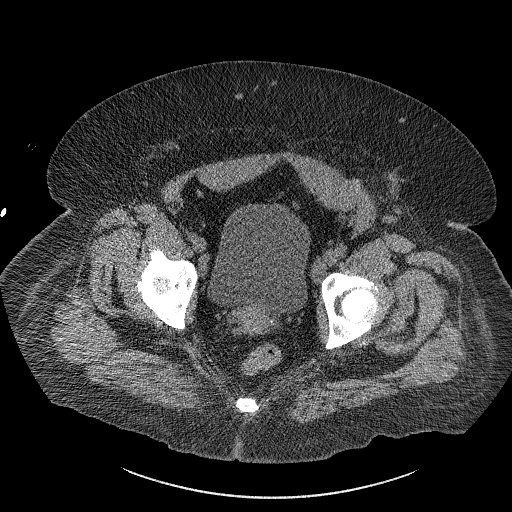
[im 119/184  soft-tissue]
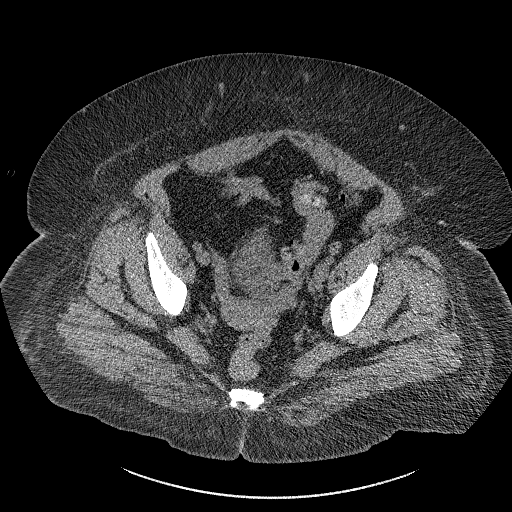
[im 119/184  bone]
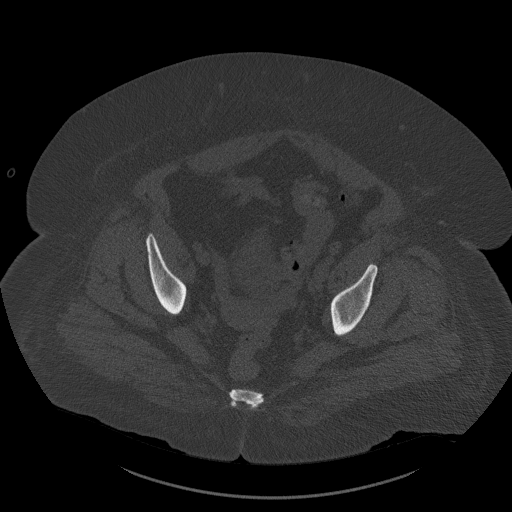
[im 130/184  soft-tissue]
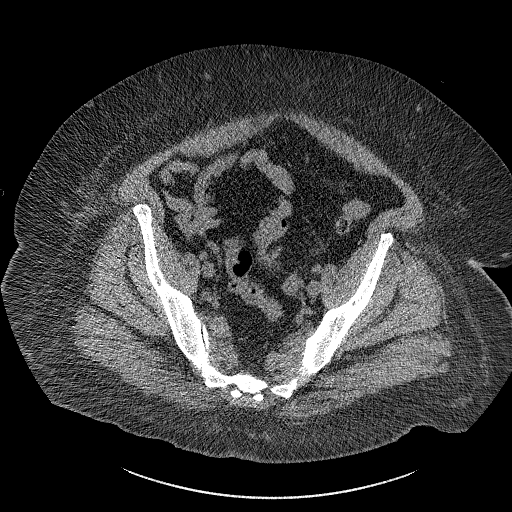
[im 148/184  soft-tissue]
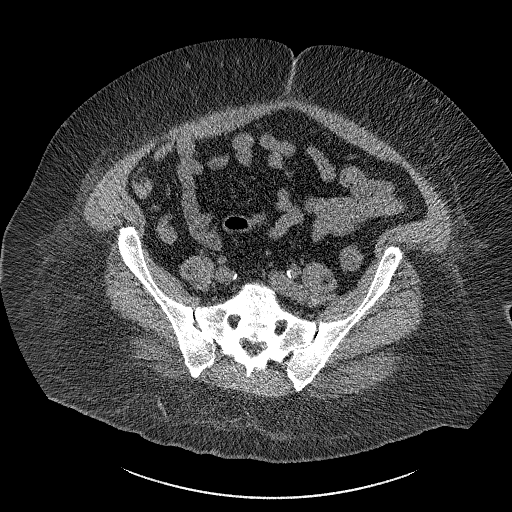
[im 160/184  soft-tissue]
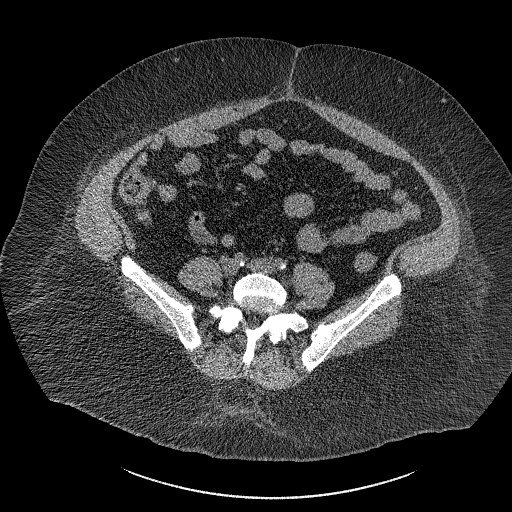
[im 172/184  soft-tissue]
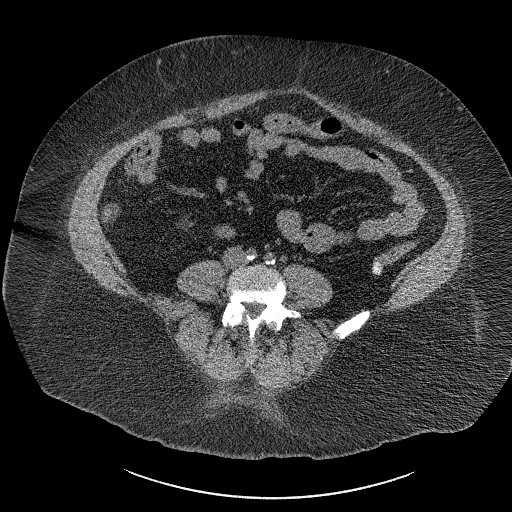

[Series 8: coronal st · coronal · 0.72mm/px · 3 of 196 slices shown]
[im 66/196  soft-tissue]
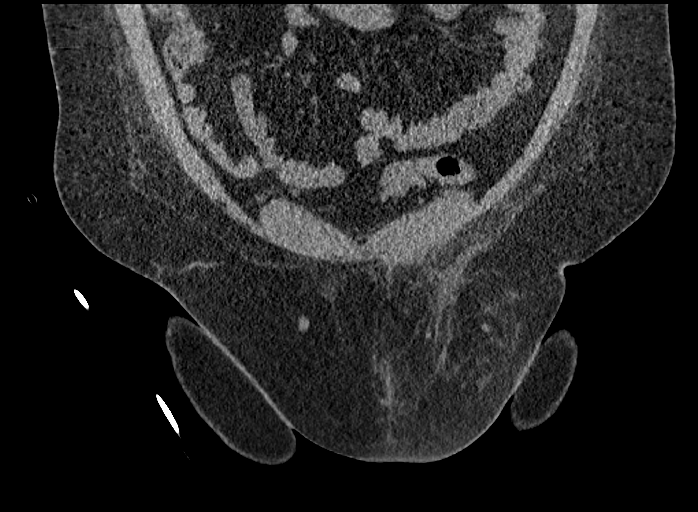
[im 87/196  soft-tissue]
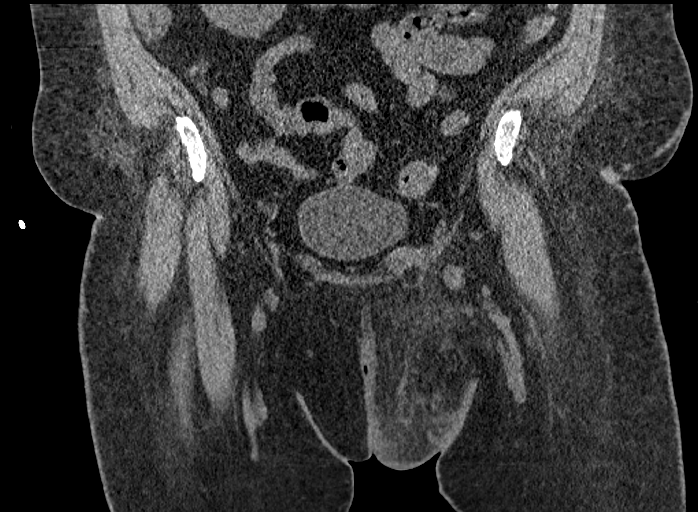
[im 109/196  soft-tissue]
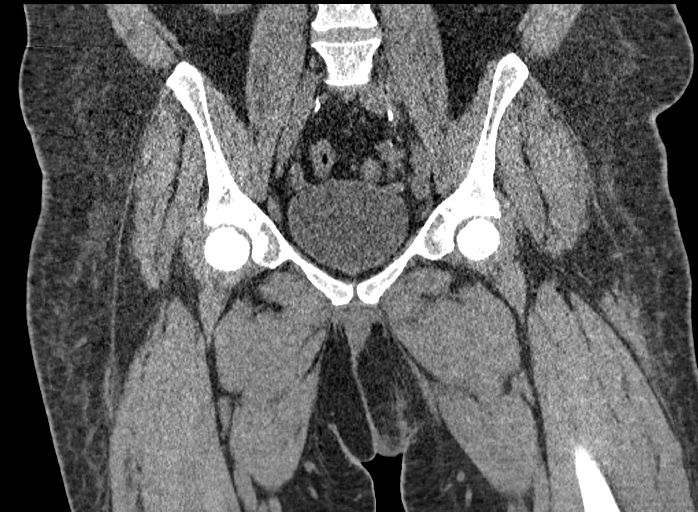

[16 of 46 positions shown; findings below may reference images not displayed]

FINDINGS: Urinary Tract: No bladder abnormality seen. Distal ureters appear
negative. Inferior renal poles appear negative.

Bowel: Sigmoid diverticulosis without evidence of diverticulitis. No
other bowel finding.

Vascular/Lymphatic: Atherosclerosis of the aorta and the iliac
arteries. No aneurysm.

Reproductive: No uterine or adnexal mass or inflammatory change. No
abnormality of the vagina identified.

Other: Edema and swelling of the mons pubis on the left and the
lower left abdominal panniculus consistent with cellulitis. No
evidence of drainable deep abscess.

Musculoskeletal: Ordinary lower lumbar degenerative changes. Other
bones of the region appear normal.
IMPRESSION: Swelling and edema of the mons pubis and the lower left abdominal
panniculus on the left consistent with cellulitis. No evidence of
drainable abscess.

## 2018-10-19 IMAGING — CR DG CHEST 2V
2 series · 2 of 2 positions shown · non-contrast
Comparison: Chest x-ray dated October 19, 2017.

CLINICAL DATA: Pelvic infection.  No chest complaints.

EXAM:
CHEST - 2 VIEW

[chest pa]
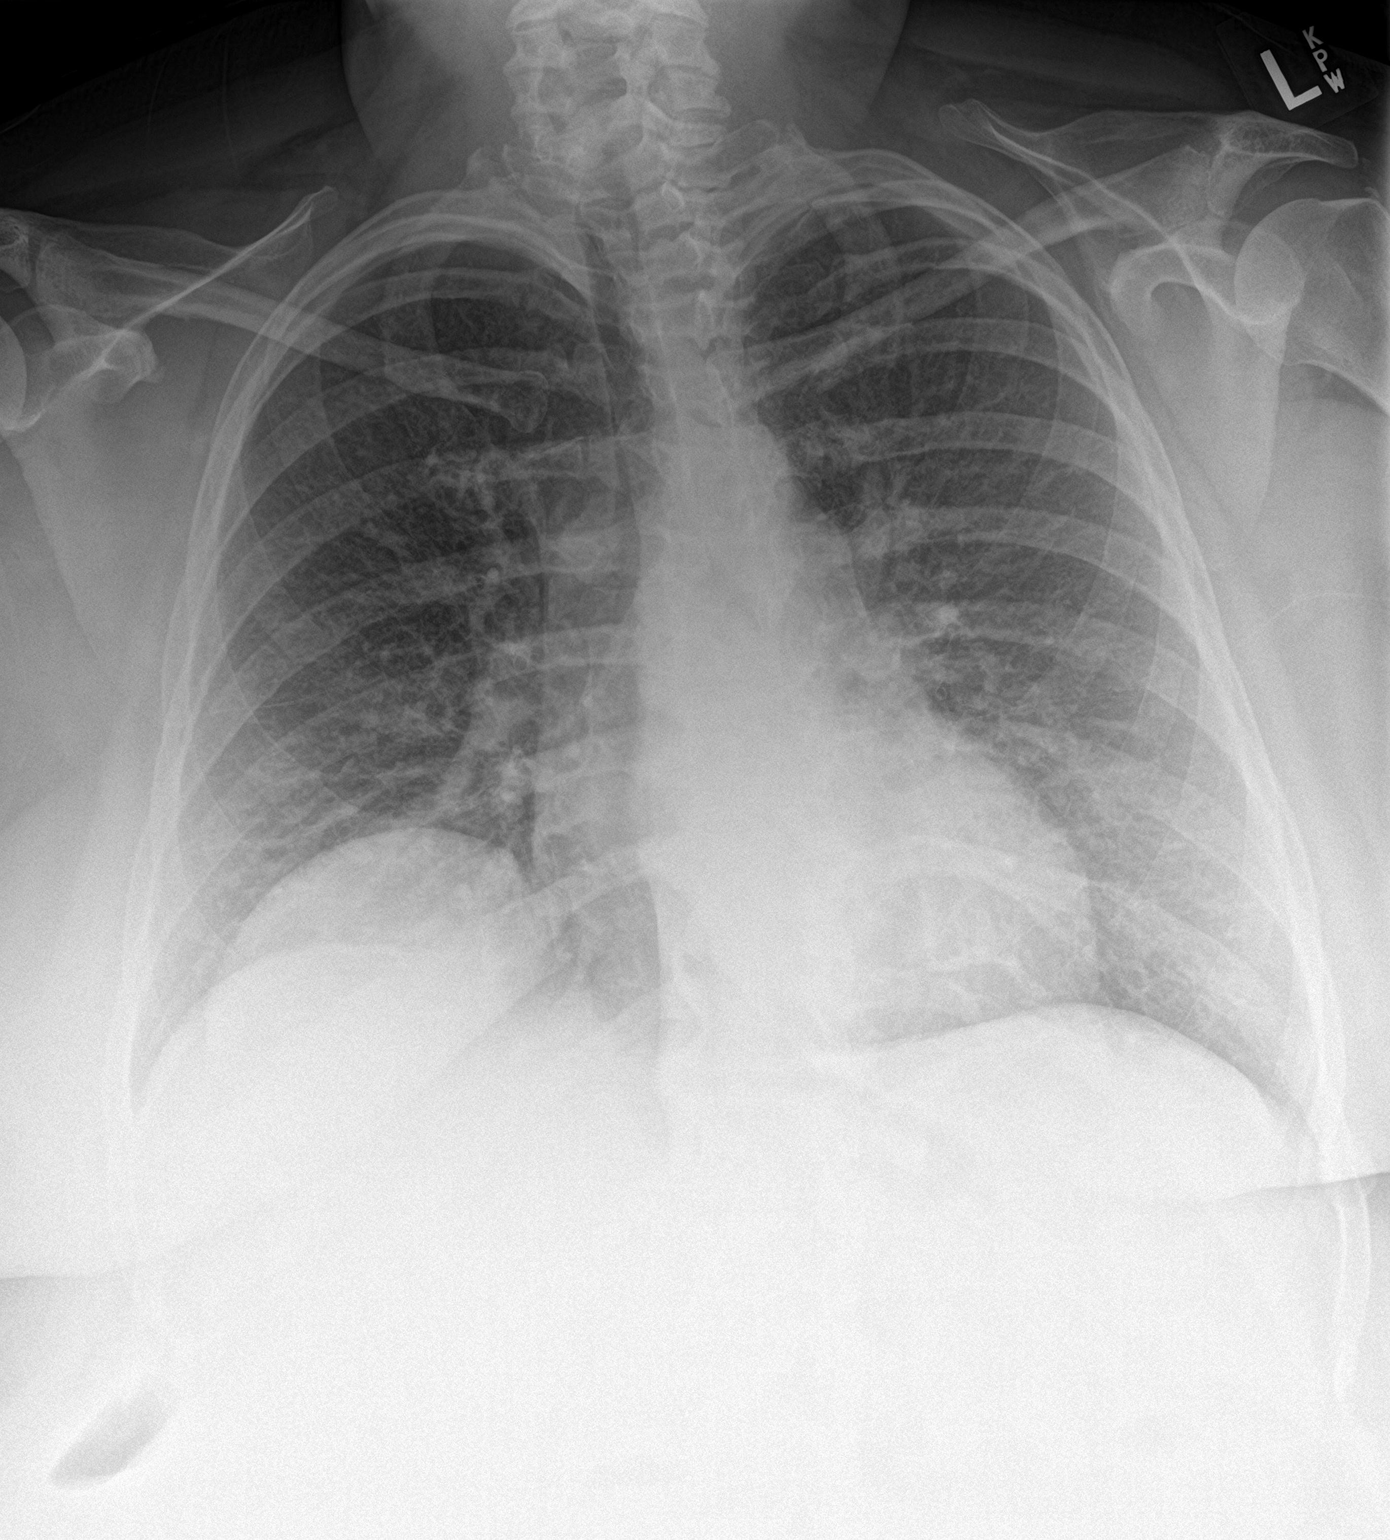

[chest lat]
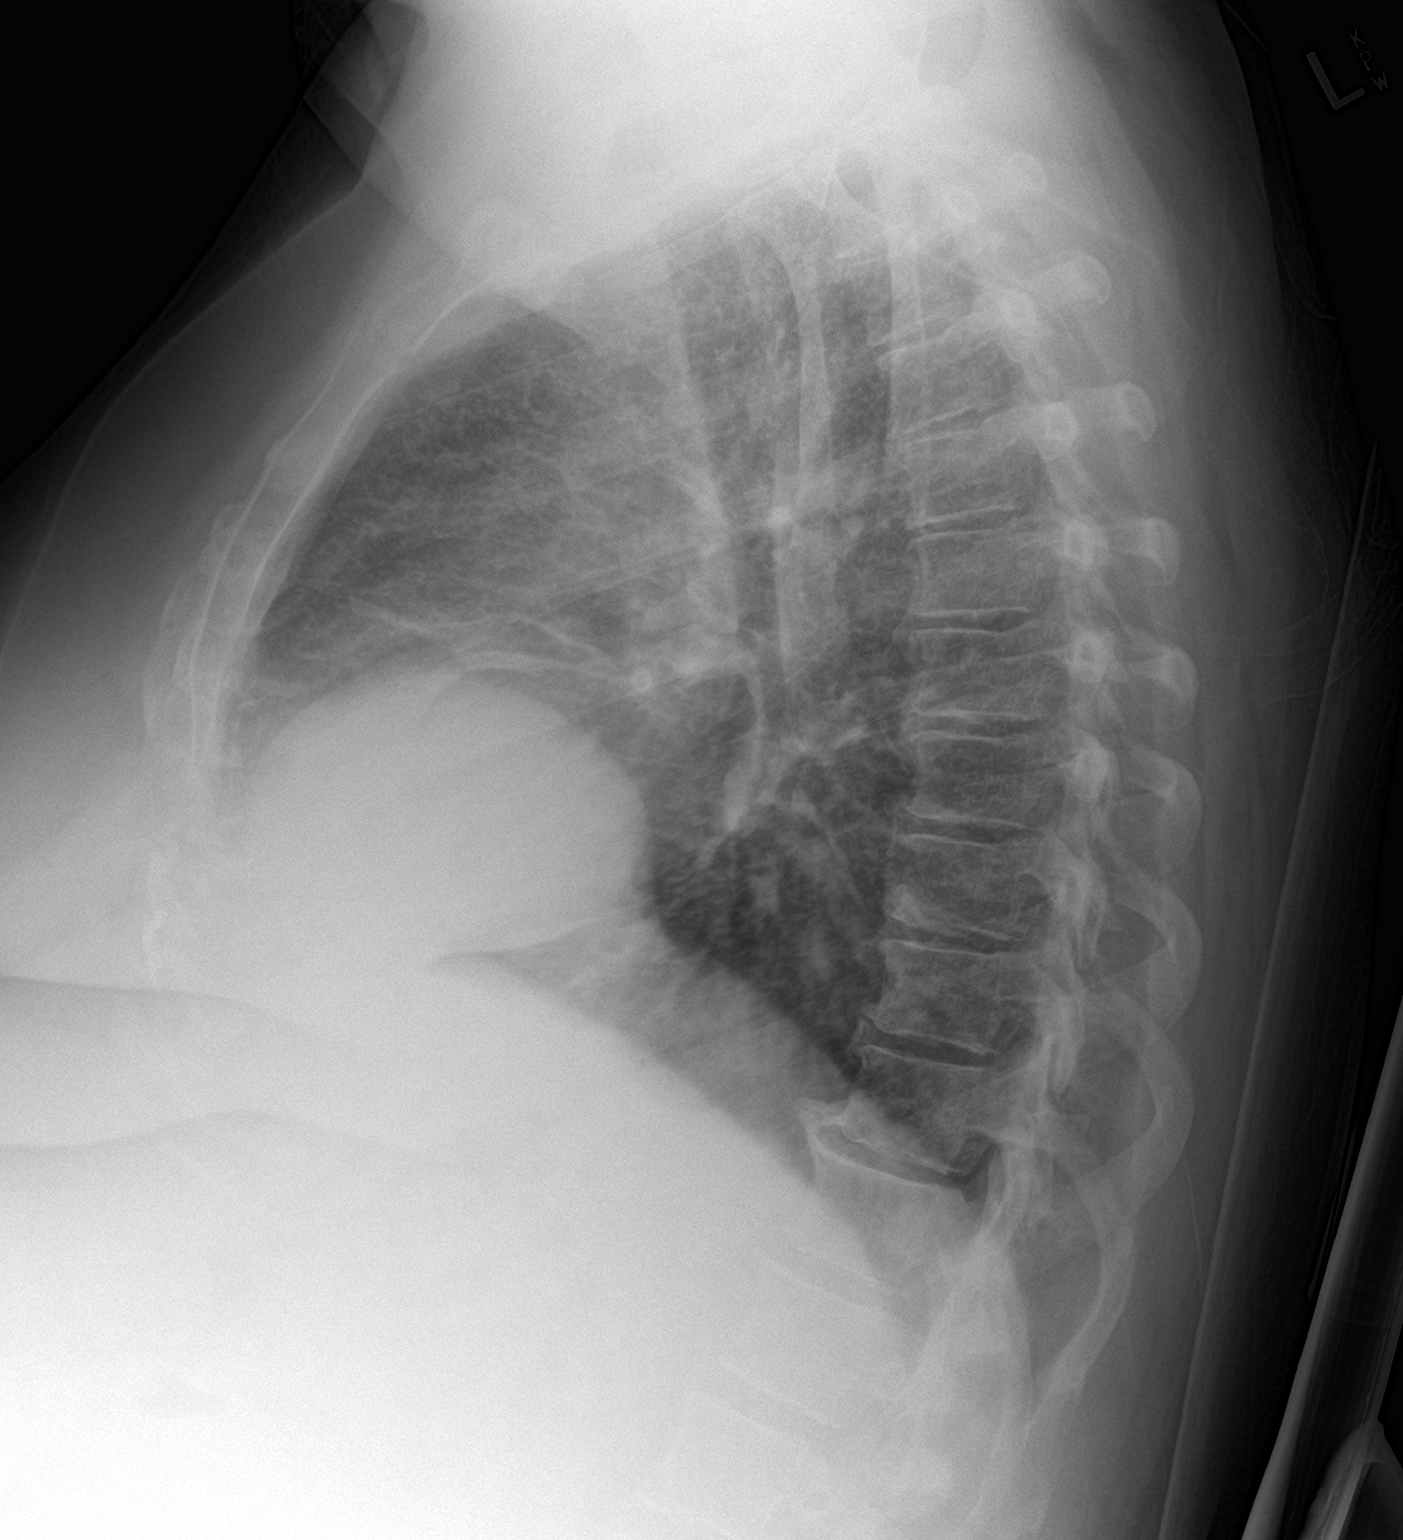

[2 of 2 positions shown; findings below may reference images not displayed]

FINDINGS: The heart remains at the upper limits of normal in size. Normal
pulmonary vascularity. Mild chronic bronchitic changes are again
noted. Unchanged eventration of the right hemidiaphragm. Linear
atelectasis in the right middle lobe. No focal consolidation,
pleural effusion, or pneumothorax. No acute osseous abnormality.
IMPRESSION: Chronic bronchitic changes.  No active cardiopulmonary disease.

## 2018-10-22 DIAGNOSIS — F17210 Nicotine dependence, cigarettes, uncomplicated: Secondary | ICD-10-CM | POA: Diagnosis not present

## 2018-10-22 DIAGNOSIS — F419 Anxiety disorder, unspecified: Secondary | ICD-10-CM | POA: Diagnosis not present

## 2018-10-22 DIAGNOSIS — Z8673 Personal history of transient ischemic attack (TIA), and cerebral infarction without residual deficits: Secondary | ICD-10-CM | POA: Diagnosis not present

## 2018-10-22 DIAGNOSIS — I2510 Atherosclerotic heart disease of native coronary artery without angina pectoris: Secondary | ICD-10-CM | POA: Diagnosis not present

## 2018-10-22 DIAGNOSIS — I4510 Unspecified right bundle-branch block: Secondary | ICD-10-CM | POA: Diagnosis not present

## 2018-10-22 DIAGNOSIS — I5032 Chronic diastolic (congestive) heart failure: Secondary | ICD-10-CM | POA: Diagnosis not present

## 2018-10-22 DIAGNOSIS — K219 Gastro-esophageal reflux disease without esophagitis: Secondary | ICD-10-CM | POA: Diagnosis not present

## 2018-10-22 DIAGNOSIS — E1142 Type 2 diabetes mellitus with diabetic polyneuropathy: Secondary | ICD-10-CM | POA: Diagnosis not present

## 2018-10-22 DIAGNOSIS — F319 Bipolar disorder, unspecified: Secondary | ICD-10-CM | POA: Diagnosis not present

## 2018-10-22 DIAGNOSIS — I130 Hypertensive heart and chronic kidney disease with heart failure and stage 1 through stage 4 chronic kidney disease, or unspecified chronic kidney disease: Secondary | ICD-10-CM | POA: Diagnosis not present

## 2018-10-22 DIAGNOSIS — Z9981 Dependence on supplemental oxygen: Secondary | ICD-10-CM | POA: Diagnosis not present

## 2018-10-22 DIAGNOSIS — J449 Chronic obstructive pulmonary disease, unspecified: Secondary | ICD-10-CM | POA: Diagnosis not present

## 2018-10-22 DIAGNOSIS — E1122 Type 2 diabetes mellitus with diabetic chronic kidney disease: Secondary | ICD-10-CM | POA: Diagnosis not present

## 2018-10-22 DIAGNOSIS — Z794 Long term (current) use of insulin: Secondary | ICD-10-CM | POA: Diagnosis not present

## 2018-10-22 DIAGNOSIS — N182 Chronic kidney disease, stage 2 (mild): Secondary | ICD-10-CM | POA: Diagnosis not present

## 2018-10-23 ENCOUNTER — Other Ambulatory Visit: Payer: Self-pay | Admitting: Primary Care

## 2018-10-23 DIAGNOSIS — M792 Neuralgia and neuritis, unspecified: Secondary | ICD-10-CM

## 2018-10-23 DIAGNOSIS — G629 Polyneuropathy, unspecified: Secondary | ICD-10-CM

## 2018-10-23 DIAGNOSIS — E1042 Type 1 diabetes mellitus with diabetic polyneuropathy: Secondary | ICD-10-CM

## 2018-10-23 NOTE — Telephone Encounter (Signed)
Best number [phone] Landon @ [place] regarding Famotidine This has been back ordered and she needs it changed to something else

## 2018-10-23 NOTE — Telephone Encounter (Signed)
We need to discuss reducing the dose of this medication as it contributes to a lot of drowsiness. If she actually still taking the Lyrica medication for her neuropathy?  If so how much and how often?

## 2018-10-23 NOTE — Telephone Encounter (Signed)
pregabalin (LYRICA) 300 MG  Last prescribed on 06/15/2018. Last office visit on 09/05/2018. Next future appointment on 12/17/2018.

## 2018-10-24 ENCOUNTER — Other Ambulatory Visit: Payer: Self-pay

## 2018-10-24 ENCOUNTER — Other Ambulatory Visit: Payer: Self-pay | Admitting: Primary Care

## 2018-10-24 ENCOUNTER — Ambulatory Visit (INDEPENDENT_AMBULATORY_CARE_PROVIDER_SITE_OTHER): Payer: BLUE CROSS/BLUE SHIELD | Admitting: Primary Care

## 2018-10-24 DIAGNOSIS — M79604 Pain in right leg: Secondary | ICD-10-CM | POA: Diagnosis not present

## 2018-10-24 DIAGNOSIS — M792 Neuralgia and neuritis, unspecified: Secondary | ICD-10-CM

## 2018-10-24 DIAGNOSIS — G894 Chronic pain syndrome: Secondary | ICD-10-CM | POA: Diagnosis not present

## 2018-10-24 DIAGNOSIS — J449 Chronic obstructive pulmonary disease, unspecified: Secondary | ICD-10-CM

## 2018-10-24 DIAGNOSIS — M79605 Pain in left leg: Secondary | ICD-10-CM

## 2018-10-24 DIAGNOSIS — G8929 Other chronic pain: Secondary | ICD-10-CM

## 2018-10-24 DIAGNOSIS — M5441 Lumbago with sciatica, right side: Secondary | ICD-10-CM

## 2018-10-24 DIAGNOSIS — M48062 Spinal stenosis, lumbar region with neurogenic claudication: Secondary | ICD-10-CM | POA: Diagnosis not present

## 2018-10-24 DIAGNOSIS — M5442 Lumbago with sciatica, left side: Secondary | ICD-10-CM | POA: Diagnosis not present

## 2018-10-24 NOTE — Assessment & Plan Note (Signed)
Now following with neurosurgery and will undergo MRI for chronic back and lower extremity pain. Long discussion about the need to reduce sedative medication and she agrees to cut back on Lyrica to 300 mg BID. New Rx sent to pharmacy. She will update.

## 2018-10-24 NOTE — Telephone Encounter (Signed)
See office visit regarding Lyrica refill.

## 2018-10-24 NOTE — Telephone Encounter (Addendum)
Patient called back. She stated that yes, she takes Lyrica for her neuropathy. She takes 1 capsule 3 times daily.  I did schedule a virtual appointment for today 10/24/2018 to discuss.  Landon @ [facility] regarding Famotidine This has been back ordered and she needs it changed to something else

## 2018-10-24 NOTE — Patient Instructions (Signed)
We've reduced your Lyrica to 300 mg twice daily in an attempt to reduce the amount of sedating medications you take.  Follow up with your back doctor as discussed.  It was a pleasure to see you today!

## 2018-10-24 NOTE — Telephone Encounter (Signed)
Message left for patient to return my call.  

## 2018-10-24 NOTE — Progress Notes (Signed)
Subjective:    Patient ID: [name], female    DOB: [birth year], 48 y.o.   MRN: [id number]  HPI  Virtual Visit via Video Note  I connected with [name] on 10/24/18 at  2:20 PM EDT by a video enabled telemedicine application and verified that I am speaking with the correct person using two identifiers.   I discussed the limitations of evaluation and management by telemedicine and the availability of in person appointments. The patient expressed understanding and agreed to proceed. She is at home, I am in the office.  We attempted a video visit and there is a local Internet outage so our visit had to be completed via phone call.   History of Present Illness:  [name] is a 48 year old female with a complex medical history including uncontrolled type 2 diabetes, diabetic neuropathy, TIA, CHF, PAD who presents today via video for medication refill of Lyrica.  She is currently managed on Lyrica 300 mg TID for which she has been taking for years. She has had numerous incidences where she's been overly drowsy and difficult to arouse. This has been noted in our office and in the ED.   She is on nearly 12 oral medications, with several potentially sedating mediation including Lyrica, Xanax, Trazodone, Requip. She does follow with psychiatry and takes Xanax and Trazodone everyday/evening. Also taking Lyrica three times daily and Requip nightly. She endorses a lot of reduction in pain with Lyrica, was following with pain management who initially placed her on this regimen.   She is following with neurosurgery and she will undergo a MRI of her lumbar spine soon. She does feel drowsy during the day and will nap nearly everyday for several hours.   Observations/Objective:  Alert and oriented. No distress. Speaking in complete sentences.  Assessment and Plan:  See problem based charting.  Follow Up Instructions:  We've reduced your Lyrica to 300 mg twice daily in an attempt to  reduce the amount of sedating medications you take.  Follow up with your back doctor as discussed.  It was a pleasure to see you today!     I discussed the assessment and treatment plan with the patient. The patient was provided an opportunity to ask questions and all were answered. The patient agreed with the plan and demonstrated an understanding of the instructions.   The patient was advised to call back or seek an in-person evaluation if the symptoms worsen or if the condition fails to improve as anticipated.     Nancy Koch, NP    Review of Systems  Respiratory: Negative for shortness of breath.   Cardiovascular: Negative for chest pain.  Musculoskeletal: Positive for back pain.  Neurological: Positive for numbness. Negative for dizziness and headaches.       Past Medical History:  Diagnosis Date  . Anxiety    takes Prozac daily  . Anxiety   . Aortic valve calcification   . Asthma    Advair and Spirva daily  . Asthma   . Bipolar disorder ([place])   . CAD (coronary artery disease)    a. LHC 11/2013 done for CP/fluid retention: mild disease in prox LAD, mild-mod disease in mRCA, EF 60% with normal LVEDP. b. Normal nuc 03/2016.  Marland Kitchen CHF (congestive heart failure) ([place])   . Chronic diastolic CHF (congestive heart failure) ([place])   . CKD (chronic kidney disease), stage II   . COPD (chronic obstructive pulmonary disease) ([facility])    a. nocturnal  O2.  . COPD (chronic obstructive pulmonary disease) ([place])   . Coronary artery disease   . Decreased urine stream   . Diabetes mellitus   . Diabetes mellitus without complication ([place])   . Dyspnea   . Family history of adverse reaction to anesthesia    mom gets nauseated  . GERD (gastroesophageal reflux disease)    takes Pepcid daily  . History of blood clots    left leg 3-39yrs ago  . Hyperlipidemia   . Hypertension   . Hypertriglyceridemia   . Inguinal hernia, left 01/2015  . Muscle spasm   . Open wound of genital labia    . Peripheral neuropathy   . RBBB   . Seizures ([place])   . Sepsis ([place]) 01/19/2018  . Sinus tachycardia    a. persistent since 2009.  Marland Kitchen Smokers' cough ([place])   . Stroke [facility]) 1989   left sided weakness  . TIA (transient ischemic attack)   . Tobacco abuse   . Vulvar abscess 01/23/2018     Social History   Socioeconomic History  . Marital status: Married    Spouse name: Not on file  . Number of children: Not on file  . Years of education: Not on file  . Highest education level: Not on file  Occupational History  . Not on file  Social Needs  . Financial resource strain: Not on file  . Food insecurity:    Worry: Not on file    Inability: Not on file  . Transportation needs:    Medical: Not on file    Non-medical: Not on file  Tobacco Use  . Smoking status: Current Every Day Smoker    Packs/day: 1.00    Years: 36.00    Pack years: 36.00    Types: Cigarettes    Last attempt to quit: 01/18/2018    Years since quitting: 0.7  . Smokeless tobacco: Never Used  Substance and Sexual Activity  . Alcohol use: No    Frequency: Never  . Drug use: No  . Sexual activity: Not Currently  Lifestyle  . Physical activity:    Days per week: Patient refused    Minutes per session: Patient refused  . Stress: Only a little  Relationships  . Social connections:    Talks on phone: More than three times a week    Gets together: Patient refused    Attends religious service: Patient refused    Active member of club or organization: Patient refused    Attends meetings of clubs or organizations: Patient refused    Relationship status: Married  . Intimate partner violence:    Fear of current or ex partner: Patient refused    Emotionally abused: Patient refused    Physically abused: Patient refused    Forced sexual activity: Patient refused  Other Topics Concern  . Not on file  Social History Narrative   ** Merged History Encounter **       Lives in [place]   Married but lives with  Boyfriend - not legally separated   Disabled - for BiPolar, Seizure disorder, diabetes   Formerly worked at [employer]          Past Surgical History:  Procedure Laterality Date  . HERNIA REPAIR    . INCISION AND DRAINAGE ABSCESS N/A 01/26/2018   Procedure: INCISION AND DEBRIDEMENT OF VULVAR NECROTIZING SOFT TISSUE INFECTION;  Surgeon: Greer Pickerel, MD;  Location: [place];  Service: General;  Laterality: N/A;  . INCISION AND DRAINAGE  PERIRECTAL ABSCESS N/A 01/22/2018   Procedure: IRRIGATION AND DEBRIDEMENT LABIAL/VULVAR AREA;  Surgeon: Coralie Keens, MD;  Location: [place];  Service: General;  Laterality: N/A;  . INCISION AND DRAINAGE PERIRECTAL ABSCESS N/A 01/29/2018   Procedure: IRRIGATION AND DEBRIDEMENT VULVA;  Surgeon: Excell Seltzer, MD;  Location: [place];  Service: General;  Laterality: N/A;  . INGUINAL HERNIA REPAIR Left 04/08/2015   Procedure: OPEN LEFT INGUINAL HERNIA REPAIR WITH MESH;  Surgeon: Ralene Ok, MD;  Location: [place];  Service: General;  Laterality: Left;  . INSERTION OF MESH Left 04/08/2015   Procedure: INSERTION OF MESH;  Surgeon: Ralene Ok, MD;  Location: [place];  Service: General;  Laterality: Left;  . LAPAROSCOPY     Endometriosis  . LEFT HEART CATHETERIZATION WITH CORONARY ANGIOGRAM N/A 12/05/2013   Procedure: LEFT HEART CATHETERIZATION WITH CORONARY ANGIOGRAM;  Surgeon: Jettie Booze, MD;  Location: [facility] CATH LAB;  Service: Cardiovascular;  Laterality: N/A;  . right kidney drained    . TEE WITHOUT CARDIOVERSION N/A 11/28/2013   Procedure: TRANSESOPHAGEAL ECHOCARDIOGRAM (TEE);  Surgeon: Thayer Headings, MD;  [facility];  Service: Cardiovascular;  Laterality: N/A;    Family History  Problem Relation Age of Onset  . Venous thrombosis Brother   . Other Brother        BRAIN TUMOR  . Asthma Father   . Diabetes Father   . Coronary artery disease Mother   . Hypertension Mother   . Diabetes Mother   . Asthma Sister   . Diabetes type II  Brother     Allergies  Allergen Reactions  . Adhesive [Tape] Rash and Other (See Comments)    TAKES OFF THE SKIN (CERTAIN MEDICAL TAPES DO THIS!!)  . Metoprolol Shortness Of Breath    Occurrence of shortness of breath after 3 days  . Montelukast Shortness Of Breath  . Morphine Sulfate Anaphylaxis, Shortness Of Breath and Nausea And Vomiting    Swollen Throat - Able to tolerate dilaudid  . Penicillins Anaphylaxis, Hives and Shortness Of Breath    Throat swells Has patient had a PCN reaction causing immediate rash, facial/tongue/throat swelling, SOB or lightheadedness with hypotension: Yes Has patient had a PCN reaction causing severe rash involving mucus membranes or skin necrosis: No Has patient had a PCN reaction that required hospitalization: Yes Has patient had a PCN reaction occurring within the last 10 years: No If all of the above answers are "NO", then may proceed with Cephalosporin use.   . Prednisone Anaphylaxis  . Diltiazem Swelling  . Gabapentin Swelling    Current Outpatient Medications on File Prior to Visit  Medication Sig Dispense Refill  . acetaminophen (TYLENOL) 500 MG tablet Take 500 mg by mouth every 4 (four) hours as needed.    Marland Kitchen albuterol (PROVENTIL HFA;VENTOLIN HFA) 108 (90 Base) MCG/ACT inhaler INHALE 2 PUFFS INTO THE LUNGS EVERY 4 HOURS AS NEEDED FOR SHORTNESS OF BREATH 6.7 g 0  . ALPRAZolam (XANAX) 0.5 MG tablet Take 1 tablet (0.5 mg total) by mouth 2 (two) times daily as needed for anxiety.  0  . aspirin EC 81 MG tablet Take 81 mg by mouth every morning.    . budesonide (PULMICORT) 0.5 MG/2ML nebulizer solution Take 0.5 mg by nebulization 2 (two) times daily.    . budesonide-formoterol (SYMBICORT) 80-4.5 MCG/ACT inhaler INHALE 2 PUFFS BY MOUTH EVERY 12 HOURS TO PREVENT COUGH OR WHEEZING *RINSE MOUTH AFTER USING* 10.2 g 2  . busPIRone (BUSPAR) 30 MG tablet Take 30 mg  by mouth 2 (two) times daily.    . famotidine (PEPCID) 20 MG tablet TAKE 1 TABLET BY MOUTH  TWICE (2) DAILY FOR ACID INDIGESTION (Patient taking differently: Take 20 mg by mouth 2 (two) times daily. ) 180 tablet 3  . FLUoxetine (PROZAC) 40 MG capsule Take 40 mg by mouth daily.     Marland Kitchen glucose blood (ONETOUCH VERIO) test strip Use to monitor glucose levels three times daily; E11.9; E10.42 100 each 12  . Insulin Pen Needle 32G X 4 MM MISC Used to give insulin injections twice daily. 100 each 11  . insulin regular human CONCENTRATED (HUMULIN R U-500 KWIKPEN) 500 UNIT/ML kwikpen Inject 200 Units into the skin 2 (two) times daily with a meal. 10 pen 11  . ipratropium-albuterol (DUONEB) 0.5-2.5 (3) MG/3ML SOLN Take 3 mLs by nebulization every 4 (four) hours as needed (wheezing/shortness of breath). 360 mL 0  . linaclotide (LINZESS) 290 MCG CAPS capsule Take 1 capsule (290 mcg total) by mouth daily before breakfast. 90 capsule 0  . loperamide (IMODIUM A-D) 2 MG tablet Take 4 mg by mouth 3 (three) times daily as needed for diarrhea or loose stools.    . metFORMIN (GLUCOPHAGE) 1000 MG tablet Take 1,000 mg by mouth 2 (two) times daily with a meal.    . metolazone (ZAROXOLYN) 2.5 MG tablet Take 2.5 mg by mouth every other day. Take 2.5 mg by mouth every 48 hours on Tuesday, Thursday and Saturday    . OneTouch Delica Lancets 12Y MISC 1 each by Does not apply route 3 (three) times daily. Use to monitor glucose levels three times daily; E11.9; E10.42 100 each 11  . OXYGEN Inhale 4 L into the lungs at bedtime.     . pregabalin (LYRICA) 300 MG capsule Take 1 capsule (300 mg total) by mouth 2 (two) times daily. 180 capsule 0  . PRESCRIPTION MEDICATION Place 1 drop into both eyes daily as needed (every 14 weeks before injections).    . ranitidine (ZANTAC) 150 MG tablet Take 150 mg by mouth 2 (two) times daily.     Marland Kitchen rOPINIRole (REQUIP) 1 MG tablet Take 1 tablet (1 mg total) by mouth at bedtime. For restless legs. 90 tablet 1  . spironolactone (ALDACTONE) 50 MG tablet TAKE 1 TABLET BY MOUTH ONCE DAILY (Patient  taking differently: Take 50 mg by mouth daily. ) 30 tablet 3  . torsemide (DEMADEX) 20 MG tablet Take 40 mg by mouth 2 (two) times daily.    . traZODone (DESYREL) 50 MG tablet Take 50-150 mg by mouth at bedtime.    Marland Kitchen ULTICARE MINI PEN NEEDLES 31G X 6 MM MISC USE AS DIRECTED THREE TIMES DAILY 100 each 0   No current facility-administered medications on file prior to visit.     LMP 11/11/2011    Objective:   Physical Exam  Constitutional: She is oriented to person, place, and time.  Respiratory: Effort normal.  Neurological: She is alert and oriented to person, place, and time.  Psychiatric: She has a normal mood and affect.           Assessment & Plan:

## 2018-10-25 ENCOUNTER — Ambulatory Visit (INDEPENDENT_AMBULATORY_CARE_PROVIDER_SITE_OTHER): Payer: BLUE CROSS/BLUE SHIELD | Admitting: Podiatry

## 2018-10-25 ENCOUNTER — Other Ambulatory Visit: Payer: Self-pay | Admitting: Cardiology

## 2018-10-25 ENCOUNTER — Other Ambulatory Visit: Payer: Self-pay

## 2018-10-25 ENCOUNTER — Encounter: Payer: Self-pay | Admitting: Podiatry

## 2018-10-25 VITALS — Temp 96.0°F

## 2018-10-25 DIAGNOSIS — E1142 Type 2 diabetes mellitus with diabetic polyneuropathy: Secondary | ICD-10-CM | POA: Diagnosis not present

## 2018-10-25 DIAGNOSIS — I5032 Chronic diastolic (congestive) heart failure: Secondary | ICD-10-CM | POA: Diagnosis not present

## 2018-10-25 DIAGNOSIS — M79674 Pain in right toe(s): Secondary | ICD-10-CM | POA: Diagnosis not present

## 2018-10-25 DIAGNOSIS — F419 Anxiety disorder, unspecified: Secondary | ICD-10-CM | POA: Diagnosis not present

## 2018-10-25 DIAGNOSIS — Z794 Long term (current) use of insulin: Secondary | ICD-10-CM | POA: Diagnosis not present

## 2018-10-25 DIAGNOSIS — K219 Gastro-esophageal reflux disease without esophagitis: Secondary | ICD-10-CM | POA: Diagnosis not present

## 2018-10-25 DIAGNOSIS — Z8673 Personal history of transient ischemic attack (TIA), and cerebral infarction without residual deficits: Secondary | ICD-10-CM | POA: Diagnosis not present

## 2018-10-25 DIAGNOSIS — Z9981 Dependence on supplemental oxygen: Secondary | ICD-10-CM | POA: Diagnosis not present

## 2018-10-25 DIAGNOSIS — B351 Tinea unguium: Secondary | ICD-10-CM | POA: Diagnosis not present

## 2018-10-25 DIAGNOSIS — M79675 Pain in left toe(s): Secondary | ICD-10-CM | POA: Diagnosis not present

## 2018-10-25 DIAGNOSIS — I4510 Unspecified right bundle-branch block: Secondary | ICD-10-CM | POA: Diagnosis not present

## 2018-10-25 DIAGNOSIS — I130 Hypertensive heart and chronic kidney disease with heart failure and stage 1 through stage 4 chronic kidney disease, or unspecified chronic kidney disease: Secondary | ICD-10-CM | POA: Diagnosis not present

## 2018-10-25 DIAGNOSIS — E1159 Type 2 diabetes mellitus with other circulatory complications: Secondary | ICD-10-CM

## 2018-10-25 DIAGNOSIS — J449 Chronic obstructive pulmonary disease, unspecified: Secondary | ICD-10-CM | POA: Diagnosis not present

## 2018-10-25 DIAGNOSIS — I2510 Atherosclerotic heart disease of native coronary artery without angina pectoris: Secondary | ICD-10-CM | POA: Diagnosis not present

## 2018-10-25 DIAGNOSIS — N182 Chronic kidney disease, stage 2 (mild): Secondary | ICD-10-CM | POA: Diagnosis not present

## 2018-10-25 DIAGNOSIS — F319 Bipolar disorder, unspecified: Secondary | ICD-10-CM | POA: Diagnosis not present

## 2018-10-25 DIAGNOSIS — I6521 Occlusion and stenosis of right carotid artery: Secondary | ICD-10-CM

## 2018-10-25 DIAGNOSIS — F17210 Nicotine dependence, cigarettes, uncomplicated: Secondary | ICD-10-CM | POA: Diagnosis not present

## 2018-10-25 DIAGNOSIS — E1122 Type 2 diabetes mellitus with diabetic chronic kidney disease: Secondary | ICD-10-CM | POA: Diagnosis not present

## 2018-10-25 NOTE — Progress Notes (Signed)
Complaint:  Visit Type: Patient returns to my office for continued preventative foot care services. Complaint: Patient states" my nails have grown long and thick and become painful to walk and wear shoes" Patient has been diagnosed with DM with neuropathy. The patient presents for preventative foot care services. No changes to ROS  Podiatric Exam: Vascular: dorsalis pedis and posterior tibial pulses are weakly  palpable bilateral. Capillary return is immediate. Temperature gradient is WNL. Skin turgor WNL  Sensorium: Absent  Semmes Weinstein monofilament test. Normal tactile sensation bilaterally. Nail Exam: Pt has thick disfigured discolored nails with subungual debris noted bilateral entire nail hallux through fifth toenails Ulcer Exam: There is no evidence of ulcer or pre-ulcerative changes or infection. Orthopedic Exam: Muscle tone and strength are WNL. No limitations in general ROM. No crepitus or effusions noted. Foot type and digits show no abnormalities. Mild  HAV  B/L. Skin: No Porokeratosis. No infection or ulcers  Diagnosis:  Onychomycosis, , Pain in right toe, pain in left toes,  Diabetes with angiopathy and neuropathy  Treatment & Plan Procedures and Treatment: Consent by patient was obtained for treatment procedures.   Debridement of mycotic and hypertrophic toenails, 1 through 5 bilateral and clearing of subungual debris. No ulceration, no infection noted. Patient waiting on shoes. Return Visit-Office Procedure: Patient instructed to return to the office for a follow up visit 3 months for continued evaluation and treatment.    Gardiner Barefoot DPM

## 2018-10-26 DIAGNOSIS — E113513 Type 2 diabetes mellitus with proliferative diabetic retinopathy with macular edema, bilateral: Secondary | ICD-10-CM | POA: Diagnosis not present

## 2018-10-26 LAB — HM DIABETES EYE EXAM

## 2018-10-29 DIAGNOSIS — E1142 Type 2 diabetes mellitus with diabetic polyneuropathy: Secondary | ICD-10-CM | POA: Diagnosis not present

## 2018-10-29 DIAGNOSIS — K219 Gastro-esophageal reflux disease without esophagitis: Secondary | ICD-10-CM | POA: Diagnosis not present

## 2018-10-29 DIAGNOSIS — I4510 Unspecified right bundle-branch block: Secondary | ICD-10-CM | POA: Diagnosis not present

## 2018-10-29 DIAGNOSIS — J449 Chronic obstructive pulmonary disease, unspecified: Secondary | ICD-10-CM | POA: Diagnosis not present

## 2018-10-29 DIAGNOSIS — I130 Hypertensive heart and chronic kidney disease with heart failure and stage 1 through stage 4 chronic kidney disease, or unspecified chronic kidney disease: Secondary | ICD-10-CM | POA: Diagnosis not present

## 2018-10-29 DIAGNOSIS — G40909 Epilepsy, unspecified, not intractable, without status epilepticus: Secondary | ICD-10-CM | POA: Diagnosis not present

## 2018-10-29 DIAGNOSIS — F319 Bipolar disorder, unspecified: Secondary | ICD-10-CM | POA: Diagnosis not present

## 2018-10-29 DIAGNOSIS — N182 Chronic kidney disease, stage 2 (mild): Secondary | ICD-10-CM | POA: Diagnosis not present

## 2018-10-29 DIAGNOSIS — I2510 Atherosclerotic heart disease of native coronary artery without angina pectoris: Secondary | ICD-10-CM | POA: Diagnosis not present

## 2018-10-29 DIAGNOSIS — Z7982 Long term (current) use of aspirin: Secondary | ICD-10-CM | POA: Diagnosis not present

## 2018-10-29 DIAGNOSIS — Z794 Long term (current) use of insulin: Secondary | ICD-10-CM | POA: Diagnosis not present

## 2018-10-29 DIAGNOSIS — F419 Anxiety disorder, unspecified: Secondary | ICD-10-CM | POA: Diagnosis not present

## 2018-10-29 DIAGNOSIS — I69354 Hemiplegia and hemiparesis following cerebral infarction affecting left non-dominant side: Secondary | ICD-10-CM | POA: Diagnosis not present

## 2018-10-29 DIAGNOSIS — F17210 Nicotine dependence, cigarettes, uncomplicated: Secondary | ICD-10-CM | POA: Diagnosis not present

## 2018-10-29 DIAGNOSIS — I5032 Chronic diastolic (congestive) heart failure: Secondary | ICD-10-CM | POA: Diagnosis not present

## 2018-10-29 DIAGNOSIS — E1122 Type 2 diabetes mellitus with diabetic chronic kidney disease: Secondary | ICD-10-CM | POA: Diagnosis not present

## 2018-11-01 ENCOUNTER — Other Ambulatory Visit: Payer: Self-pay | Admitting: Neurosurgery

## 2018-11-01 DIAGNOSIS — K219 Gastro-esophageal reflux disease without esophagitis: Secondary | ICD-10-CM

## 2018-11-01 DIAGNOSIS — I5032 Chronic diastolic (congestive) heart failure: Secondary | ICD-10-CM

## 2018-11-01 DIAGNOSIS — M48062 Spinal stenosis, lumbar region with neurogenic claudication: Secondary | ICD-10-CM

## 2018-11-01 DIAGNOSIS — J449 Chronic obstructive pulmonary disease, unspecified: Secondary | ICD-10-CM | POA: Diagnosis not present

## 2018-11-01 DIAGNOSIS — I4510 Unspecified right bundle-branch block: Secondary | ICD-10-CM

## 2018-11-01 DIAGNOSIS — N182 Chronic kidney disease, stage 2 (mild): Secondary | ICD-10-CM

## 2018-11-01 DIAGNOSIS — Z794 Long term (current) use of insulin: Secondary | ICD-10-CM

## 2018-11-01 DIAGNOSIS — F419 Anxiety disorder, unspecified: Secondary | ICD-10-CM

## 2018-11-01 DIAGNOSIS — E1122 Type 2 diabetes mellitus with diabetic chronic kidney disease: Secondary | ICD-10-CM

## 2018-11-01 DIAGNOSIS — Z7951 Long term (current) use of inhaled steroids: Secondary | ICD-10-CM

## 2018-11-01 DIAGNOSIS — Z9981 Dependence on supplemental oxygen: Secondary | ICD-10-CM

## 2018-11-01 DIAGNOSIS — I69354 Hemiplegia and hemiparesis following cerebral infarction affecting left non-dominant side: Secondary | ICD-10-CM | POA: Diagnosis not present

## 2018-11-01 DIAGNOSIS — E1142 Type 2 diabetes mellitus with diabetic polyneuropathy: Secondary | ICD-10-CM | POA: Diagnosis not present

## 2018-11-01 DIAGNOSIS — F319 Bipolar disorder, unspecified: Secondary | ICD-10-CM

## 2018-11-01 DIAGNOSIS — F17210 Nicotine dependence, cigarettes, uncomplicated: Secondary | ICD-10-CM

## 2018-11-01 DIAGNOSIS — I130 Hypertensive heart and chronic kidney disease with heart failure and stage 1 through stage 4 chronic kidney disease, or unspecified chronic kidney disease: Secondary | ICD-10-CM | POA: Diagnosis not present

## 2018-11-01 DIAGNOSIS — G40909 Epilepsy, unspecified, not intractable, without status epilepticus: Secondary | ICD-10-CM

## 2018-11-01 DIAGNOSIS — I2510 Atherosclerotic heart disease of native coronary artery without angina pectoris: Secondary | ICD-10-CM

## 2018-11-01 DIAGNOSIS — Z9181 History of falling: Secondary | ICD-10-CM

## 2018-11-02 ENCOUNTER — Other Ambulatory Visit: Payer: Self-pay

## 2018-11-02 ENCOUNTER — Encounter: Payer: Self-pay | Admitting: Endocrinology

## 2018-11-02 ENCOUNTER — Ambulatory Visit (INDEPENDENT_AMBULATORY_CARE_PROVIDER_SITE_OTHER): Payer: BLUE CROSS/BLUE SHIELD | Admitting: Endocrinology

## 2018-11-02 DIAGNOSIS — N760 Acute vaginitis: Secondary | ICD-10-CM

## 2018-11-02 DIAGNOSIS — E1129 Type 2 diabetes mellitus with other diabetic kidney complication: Secondary | ICD-10-CM | POA: Diagnosis not present

## 2018-11-02 DIAGNOSIS — E1165 Type 2 diabetes mellitus with hyperglycemia: Secondary | ICD-10-CM | POA: Diagnosis not present

## 2018-11-02 MED ORDER — FLUCONAZOLE 100 MG PO TABS: 100.0000 mg | ORAL_TABLET | Freq: Every day | ORAL | 3 refills | Status: DC

## 2018-11-02 MED ORDER — INSULIN REGULAR HUMAN (CONC) 500 UNIT/ML ~~LOC~~ SOPN: PEN_INJECTOR | SUBCUTANEOUS | 11 refills | Status: DC

## 2018-11-02 NOTE — Progress Notes (Signed)
Subjective:    Patient ID: [name], female    DOB: 03/05/1971, 48 y.o.   MRN: [id number]  HPI  telehealth visit today via phone visit.  Alternatives to telehealth are presented to this patient, and the patient agrees to the telehealth visit. Pt is advised of the cost of the visit, and agrees to this, also.   Patient is at home, and I am at the office.   Pt returns for f/u of diabetes mellitus: DM type: Insulin-requiring type 2 Dx'ed: 1988.  Complications: polyneuropathy, TIA, renal insuff, and DR.   Therapy: insulin since soon after dx, and metformin GDM: never DKA: never Severe hypoglycemia: last episode was mid-2018 Pancreatitis: never Pancreatic imaging: normal on 2011 CT Other: she takes multiple daily injections; she takes U-500, due to severe insulin resistance.  She does not eat lunch, so she does not take insulin then.   Interval history: pt states cbg's vary from 120-500.  It is in general higher as the day goes on.  She takes 300 units BID.  She has vaginal iching.   Past Medical History:  Diagnosis Date  . Anxiety    takes Prozac daily  . Anxiety   . Aortic valve calcification   . Asthma    Advair and Spirva daily  . Asthma   . Bipolar disorder ([place])   . CAD (coronary artery disease)    a. LHC 11/2013 done for CP/fluid retention: mild disease in prox LAD, mild-mod disease in mRCA, EF 60% with normal LVEDP. b. Normal nuc 03/2016.  Marland Kitchen CHF (congestive heart failure) ([place])   . Chronic diastolic CHF (congestive heart failure) ([place])   . CKD (chronic kidney disease), stage II   . COPD (chronic obstructive pulmonary disease) ([facility])    a. nocturnal O2.  Marland Kitchen COPD (chronic obstructive pulmonary disease) ([place])   . Coronary artery disease   . Decreased urine stream   . Diabetes mellitus   . Diabetes mellitus without complication ([place])   . Dyspnea   . Family history of adverse reaction to anesthesia    mom gets nauseated  . GERD (gastroesophageal reflux disease)    takes  Pepcid daily  . History of blood clots    left leg 3-46yrs ago  . Hyperlipidemia   . Hypertension   . Hypertriglyceridemia   . Inguinal hernia, left 01/2015  . Muscle spasm   . Open wound of genital labia   . Peripheral neuropathy   . RBBB   . Seizures ([place])   . Sepsis ([place]) 01/19/2018  . Sinus tachycardia    a. persistent since 2009.  Marland Kitchen Smokers' cough ([place])   . Stroke [facility]) 1989   left sided weakness  . TIA (transient ischemic attack)   . Tobacco abuse   . Vulvar abscess 01/23/2018    Past Surgical History:  Procedure Laterality Date  . HERNIA REPAIR    . INCISION AND DRAINAGE ABSCESS N/A 01/26/2018   Procedure: INCISION AND DEBRIDEMENT OF VULVAR NECROTIZING SOFT TISSUE INFECTION;  Surgeon: Greer Pickerel, MD;  Location: [place];  Service: General;  Laterality: N/A;  . INCISION AND DRAINAGE PERIRECTAL ABSCESS N/A 01/22/2018   Procedure: IRRIGATION AND DEBRIDEMENT LABIAL/VULVAR AREA;  Surgeon: Coralie Keens, MD;  Location: [place];  Service: General;  Laterality: N/A;  . INCISION AND DRAINAGE PERIRECTAL ABSCESS N/A 01/29/2018   Procedure: IRRIGATION AND DEBRIDEMENT VULVA;  Surgeon: Excell Seltzer, MD;  Location: [place];  Service: General;  Laterality: N/A;  . INGUINAL HERNIA REPAIR Left 04/08/2015  Procedure: OPEN LEFT INGUINAL HERNIA REPAIR WITH MESH;  Surgeon: Ralene Ok, MD;  Location: [place];  Service: General;  Laterality: Left;  . INSERTION OF MESH Left 04/08/2015   Procedure: INSERTION OF MESH;  Surgeon: Ralene Ok, MD;  Location: [place];  Service: General;  Laterality: Left;  . LAPAROSCOPY     Endometriosis  . LEFT HEART CATHETERIZATION WITH CORONARY ANGIOGRAM N/A 12/05/2013   Procedure: LEFT HEART CATHETERIZATION WITH CORONARY ANGIOGRAM;  Surgeon: Jettie Booze, MD;  Location: [facility] CATH LAB;  Service: Cardiovascular;  Laterality: N/A;  . right kidney drained    . TEE WITHOUT CARDIOVERSION N/A 11/28/2013   Procedure: TRANSESOPHAGEAL ECHOCARDIOGRAM (TEE);   Surgeon: Thayer Headings, MD;  Location: [facility] ENDOSCOPY;  Service: Cardiovascular;  Laterality: N/A;    Social History   Socioeconomic History  . Marital status: Married    Spouse name: Not on file  . Number of children: Not on file  . Years of education: Not on file  . Highest education level: Not on file  Occupational History  . Not on file  Social Needs  . Financial resource strain: Not on file  . Food insecurity:    Worry: Not on file    Inability: Not on file  . Transportation needs:    Medical: Not on file    Non-medical: Not on file  Tobacco Use  . Smoking status: Current Every Day Smoker    Packs/day: 1.00    Years: 36.00    Pack years: 36.00    Types: Cigarettes    Last attempt to quit: 01/18/2018    Years since quitting: 0.7  . Smokeless tobacco: Never Used  Substance and Sexual Activity  . Alcohol use: No    Frequency: Never  . Drug use: No  . Sexual activity: Not Currently  Lifestyle  . Physical activity:    Days per week: Patient refused    Minutes per session: Patient refused  . Stress: Only a little  Relationships  . Social connections:    Talks on phone: More than three times a week    Gets together: Patient refused    Attends religious service: Patient refused    Active member of club or organization: Patient refused    Attends meetings of clubs or organizations: Patient refused    Relationship status: Married  . Intimate partner violence:    Fear of current or ex partner: Patient refused    Emotionally abused: Patient refused    Physically abused: Patient refused    Forced sexual activity: Patient refused  Other Topics Concern  . Not on file  Social History Narrative   ** Merged History Encounter **       Lives in [place]   Married but lives with Boyfriend - not legally separated   Disabled - for BiPolar, Seizure disorder, diabetes   Formerly worked at [employer]          Current Outpatient Medications on File Prior to Visit  Medication  Sig Dispense Refill  . acetaminophen (TYLENOL) 500 MG tablet Take 500 mg by mouth every 4 (four) hours as needed.    Marland Kitchen albuterol (PROVENTIL HFA;VENTOLIN HFA) 108 (90 Base) MCG/ACT inhaler INHALE 2 PUFFS INTO THE LUNGS EVERY 4 HOURS AS NEEDED FOR SHORTNESS OF BREATH 6.7 g 0  . ALPRAZolam (XANAX) 0.5 MG tablet Take 1 tablet (0.5 mg total) by mouth 2 (two) times daily as needed for anxiety.  0  . aspirin EC 81 MG tablet Take 81  mg by mouth every morning.    . budesonide (PULMICORT) 0.5 MG/2ML nebulizer solution Take 0.5 mg by nebulization 2 (two) times daily.    . budesonide-formoterol (SYMBICORT) 80-4.5 MCG/ACT inhaler INHALE 2 PUFFS BY MOUTH EVERY 12 HOURS TO PREVENT COUGH OR WHEEZING *RINSE MOUTH AFTER USING* 10.2 g 2  . busPIRone (BUSPAR) 30 MG tablet Take 30 mg by mouth 2 (two) times daily.    . famotidine (PEPCID) 20 MG tablet TAKE 1 TABLET BY MOUTH TWICE (2) DAILY FOR ACID INDIGESTION (Patient taking differently: Take 20 mg by mouth 2 (two) times daily. ) 180 tablet 3  . FLUoxetine (PROZAC) 40 MG capsule Take 40 mg by mouth daily.     Marland Kitchen glucose blood (ONETOUCH VERIO) test strip Use to monitor glucose levels three times daily; E11.9; E10.42 100 each 12  . Insulin Pen Needle 32G X 4 MM MISC Used to give insulin injections twice daily. 100 each 11  . ipratropium-albuterol (DUONEB) 0.5-2.5 (3) MG/3ML SOLN Take 3 mLs by nebulization every 4 (four) hours as needed (wheezing/shortness of breath). 360 mL 0  . loperamide (IMODIUM A-D) 2 MG tablet Take 4 mg by mouth 3 (three) times daily as needed for diarrhea or loose stools.    . metFORMIN (GLUCOPHAGE) 1000 MG tablet Take 1,000 mg by mouth 2 (two) times daily with a meal.    . metolazone (ZAROXOLYN) 2.5 MG tablet Take 2.5 mg by mouth every other day. Take 2.5 mg by mouth every 48 hours on Tuesday, Thursday and Saturday    . OneTouch Delica Lancets 73A MISC 1 each by Does not apply route 3 (three) times daily. Use to monitor glucose levels three times  daily; E11.9; E10.42 100 each 11  . OXYGEN Inhale 4 L into the lungs at bedtime.     . pregabalin (LYRICA) 300 MG capsule Take 1 capsule (300 mg total) by mouth 2 (two) times daily. 180 capsule 0  . PRESCRIPTION MEDICATION Place 1 drop into both eyes daily as needed (every 14 weeks before injections).    . ranitidine (ZANTAC) 150 MG tablet Take 150 mg by mouth 2 (two) times daily.     Marland Kitchen rOPINIRole (REQUIP) 1 MG tablet Take 1 tablet (1 mg total) by mouth at bedtime. For restless legs. 90 tablet 1  . spironolactone (ALDACTONE) 50 MG tablet TAKE 1 TABLET BY MOUTH ONCE DAILY (Patient taking differently: Take 50 mg by mouth daily. ) 30 tablet 3  . tobramycin (TOBREX) 0.3 % ophthalmic solution     . torsemide (DEMADEX) 20 MG tablet Take 40 mg by mouth 2 (two) times daily.    . traZODone (DESYREL) 50 MG tablet Take 50-150 mg by mouth at bedtime.    Marland Kitchen ULTICARE MINI PEN NEEDLES 31G X 6 MM MISC USE AS DIRECTED THREE TIMES DAILY 100 each 0  . linaclotide (LINZESS) 290 MCG CAPS capsule Take 1 capsule (290 mcg total) by mouth daily before breakfast. 90 capsule 0   No current facility-administered medications on file prior to visit.     Allergies  Allergen Reactions  . Adhesive [Tape] Rash and Other (See Comments)    TAKES OFF THE SKIN (CERTAIN MEDICAL TAPES DO THIS!!)  . Metoprolol Shortness Of Breath    Occurrence of shortness of breath after 3 days  . Montelukast Shortness Of Breath  . Morphine Sulfate Anaphylaxis, Shortness Of Breath and Nausea And Vomiting    Swollen Throat - Able to tolerate dilaudid  . Penicillins Anaphylaxis, Hives and Shortness  Of Breath    Throat swells Has patient had a PCN reaction causing immediate rash, facial/tongue/throat swelling, SOB or lightheadedness with hypotension: Yes Has patient had a PCN reaction causing severe rash involving mucus membranes or skin necrosis: No Has patient had a PCN reaction that required hospitalization: Yes Has patient had a PCN reaction  occurring within the last 10 years: No If all of the above answers are "NO", then may proceed with Cephalosporin use.   . Prednisone Anaphylaxis  . Diltiazem Swelling  . Gabapentin Swelling    Family History  Problem Relation Age of Onset  . Venous thrombosis Brother   . Other Brother        BRAIN TUMOR  . Asthma Father   . Diabetes Father   . Coronary artery disease Mother   . Hypertension Mother   . Diabetes Mother   . Asthma Sister   . Diabetes type II Brother     LMP 11/11/2011   Review of Systems She denies hypoglycemia.      Objective:   Physical Exam     Assessment & Plan:  Insulin-requiring type 2 DM, with renal insuff: she needs increased rx Vaginitis, new to me.  this is presumed due to severe hyperglycemia.    Patient Instructions  check your blood sugar twice a day.  vary the time of day when you check, between before the 3 meals, and at bedtime.  also check if you have symptoms of your blood sugar being too high or too low.  please keep a record of the readings and bring it to your next appointment here (or you can bring the meter itself).  You can write it on any piece of paper.  please call us sooner if your blood sugar goes below 70, or if you have a lot of readings over 200. Please increase the insulin to 400 units with breakfast and 300 units with supper, no matter what you blood sugar is. Please have a follow-up appointment in 1 week.   I have sent a prescription to your pharmacy, to the yeast infection.

## 2018-11-02 NOTE — Patient Instructions (Addendum)
check your blood sugar twice a day.  vary the time of day when you check, between before the 3 meals, and at bedtime.  also check if you have symptoms of your blood sugar being too high or too low.  please keep a record of the readings and bring it to your next appointment here (or you can bring the meter itself).  You can write it on any piece of paper.  please call us sooner if your blood sugar goes below 70, or if you have a lot of readings over 200. Please increase the insulin to 400 units with breakfast and 300 units with supper, no matter what you blood sugar is. Please have a follow-up appointment in 1 week.   I have sent a prescription to your pharmacy, to the yeast infection.

## 2018-11-05 DIAGNOSIS — E1122 Type 2 diabetes mellitus with diabetic chronic kidney disease: Secondary | ICD-10-CM | POA: Diagnosis not present

## 2018-11-05 DIAGNOSIS — F419 Anxiety disorder, unspecified: Secondary | ICD-10-CM | POA: Diagnosis not present

## 2018-11-05 DIAGNOSIS — E1142 Type 2 diabetes mellitus with diabetic polyneuropathy: Secondary | ICD-10-CM | POA: Diagnosis not present

## 2018-11-05 DIAGNOSIS — G40909 Epilepsy, unspecified, not intractable, without status epilepticus: Secondary | ICD-10-CM | POA: Diagnosis not present

## 2018-11-05 DIAGNOSIS — I4510 Unspecified right bundle-branch block: Secondary | ICD-10-CM | POA: Diagnosis not present

## 2018-11-05 DIAGNOSIS — I69354 Hemiplegia and hemiparesis following cerebral infarction affecting left non-dominant side: Secondary | ICD-10-CM | POA: Diagnosis not present

## 2018-11-05 DIAGNOSIS — I5032 Chronic diastolic (congestive) heart failure: Secondary | ICD-10-CM | POA: Diagnosis not present

## 2018-11-05 DIAGNOSIS — J449 Chronic obstructive pulmonary disease, unspecified: Secondary | ICD-10-CM | POA: Diagnosis not present

## 2018-11-05 DIAGNOSIS — Z794 Long term (current) use of insulin: Secondary | ICD-10-CM | POA: Diagnosis not present

## 2018-11-05 DIAGNOSIS — I2510 Atherosclerotic heart disease of native coronary artery without angina pectoris: Secondary | ICD-10-CM | POA: Diagnosis not present

## 2018-11-05 DIAGNOSIS — K219 Gastro-esophageal reflux disease without esophagitis: Secondary | ICD-10-CM | POA: Diagnosis not present

## 2018-11-05 DIAGNOSIS — Z7982 Long term (current) use of aspirin: Secondary | ICD-10-CM | POA: Diagnosis not present

## 2018-11-05 DIAGNOSIS — N182 Chronic kidney disease, stage 2 (mild): Secondary | ICD-10-CM | POA: Diagnosis not present

## 2018-11-05 DIAGNOSIS — F17210 Nicotine dependence, cigarettes, uncomplicated: Secondary | ICD-10-CM | POA: Diagnosis not present

## 2018-11-05 DIAGNOSIS — I130 Hypertensive heart and chronic kidney disease with heart failure and stage 1 through stage 4 chronic kidney disease, or unspecified chronic kidney disease: Secondary | ICD-10-CM | POA: Diagnosis not present

## 2018-11-05 DIAGNOSIS — F319 Bipolar disorder, unspecified: Secondary | ICD-10-CM | POA: Diagnosis not present

## 2018-11-07 DIAGNOSIS — M48062 Spinal stenosis, lumbar region with neurogenic claudication: Secondary | ICD-10-CM | POA: Diagnosis not present

## 2018-11-07 DIAGNOSIS — M545 Low back pain: Secondary | ICD-10-CM | POA: Diagnosis not present

## 2018-11-08 ENCOUNTER — Other Ambulatory Visit: Payer: Self-pay | Admitting: Primary Care

## 2018-11-08 DIAGNOSIS — M47816 Spondylosis without myelopathy or radiculopathy, lumbar region: Secondary | ICD-10-CM | POA: Diagnosis not present

## 2018-11-09 ENCOUNTER — Other Ambulatory Visit: Payer: Self-pay

## 2018-11-09 MED ORDER — METOLAZONE 2.5 MG PO TABS: 2.5000 mg | ORAL_TABLET | ORAL | 0 refills | Status: DC

## 2018-11-09 NOTE — Telephone Encounter (Signed)
Please send to cardiology. Thanks.

## 2018-11-09 NOTE — Telephone Encounter (Signed)
This was handle already at the cardiology.

## 2018-11-09 NOTE — Telephone Encounter (Signed)
Have not been prescribed. Last office visit on 10/24/2018. Next future appointment on 12/17/2018.

## 2018-11-09 NOTE — Telephone Encounter (Signed)
Message left for patient to return my call.  

## 2018-11-12 DIAGNOSIS — J449 Chronic obstructive pulmonary disease, unspecified: Secondary | ICD-10-CM | POA: Diagnosis not present

## 2018-11-12 DIAGNOSIS — F319 Bipolar disorder, unspecified: Secondary | ICD-10-CM | POA: Diagnosis not present

## 2018-11-12 DIAGNOSIS — I130 Hypertensive heart and chronic kidney disease with heart failure and stage 1 through stage 4 chronic kidney disease, or unspecified chronic kidney disease: Secondary | ICD-10-CM | POA: Diagnosis not present

## 2018-11-12 DIAGNOSIS — K219 Gastro-esophageal reflux disease without esophagitis: Secondary | ICD-10-CM | POA: Diagnosis not present

## 2018-11-12 DIAGNOSIS — E1142 Type 2 diabetes mellitus with diabetic polyneuropathy: Secondary | ICD-10-CM | POA: Diagnosis not present

## 2018-11-12 DIAGNOSIS — E1122 Type 2 diabetes mellitus with diabetic chronic kidney disease: Secondary | ICD-10-CM | POA: Diagnosis not present

## 2018-11-12 DIAGNOSIS — I4510 Unspecified right bundle-branch block: Secondary | ICD-10-CM | POA: Diagnosis not present

## 2018-11-12 DIAGNOSIS — I69354 Hemiplegia and hemiparesis following cerebral infarction affecting left non-dominant side: Secondary | ICD-10-CM | POA: Diagnosis not present

## 2018-11-12 DIAGNOSIS — I5032 Chronic diastolic (congestive) heart failure: Secondary | ICD-10-CM | POA: Diagnosis not present

## 2018-11-12 DIAGNOSIS — F419 Anxiety disorder, unspecified: Secondary | ICD-10-CM | POA: Diagnosis not present

## 2018-11-12 DIAGNOSIS — G40909 Epilepsy, unspecified, not intractable, without status epilepticus: Secondary | ICD-10-CM | POA: Diagnosis not present

## 2018-11-12 DIAGNOSIS — N182 Chronic kidney disease, stage 2 (mild): Secondary | ICD-10-CM | POA: Diagnosis not present

## 2018-11-12 DIAGNOSIS — I2510 Atherosclerotic heart disease of native coronary artery without angina pectoris: Secondary | ICD-10-CM | POA: Diagnosis not present

## 2018-11-12 DIAGNOSIS — F17210 Nicotine dependence, cigarettes, uncomplicated: Secondary | ICD-10-CM | POA: Diagnosis not present

## 2018-11-12 DIAGNOSIS — Z7982 Long term (current) use of aspirin: Secondary | ICD-10-CM | POA: Diagnosis not present

## 2018-11-12 DIAGNOSIS — Z794 Long term (current) use of insulin: Secondary | ICD-10-CM | POA: Diagnosis not present

## 2018-11-15 ENCOUNTER — Encounter: Payer: Self-pay | Admitting: Cardiology

## 2018-11-16 ENCOUNTER — Ambulatory Visit (INDEPENDENT_AMBULATORY_CARE_PROVIDER_SITE_OTHER): Payer: BLUE CROSS/BLUE SHIELD | Admitting: Cardiology

## 2018-11-16 ENCOUNTER — Other Ambulatory Visit: Payer: Self-pay

## 2018-11-16 ENCOUNTER — Encounter: Payer: Self-pay | Admitting: Cardiology

## 2018-11-16 VITALS — BP 128/80 | HR 93 | Ht 64.0 in | Wt 286.0 lb

## 2018-11-16 DIAGNOSIS — I6521 Occlusion and stenosis of right carotid artery: Secondary | ICD-10-CM | POA: Diagnosis not present

## 2018-11-16 DIAGNOSIS — I5030 Unspecified diastolic (congestive) heart failure: Secondary | ICD-10-CM | POA: Insufficient documentation

## 2018-11-16 DIAGNOSIS — I5032 Chronic diastolic (congestive) heart failure: Secondary | ICD-10-CM

## 2018-11-16 DIAGNOSIS — R600 Localized edema: Secondary | ICD-10-CM

## 2018-11-16 HISTORY — DX: Chronic diastolic (congestive) heart failure: I50.32

## 2018-11-16 MED ORDER — METOLAZONE 2.5 MG PO TABS: 2.5000 mg | ORAL_TABLET | ORAL | 2 refills | Status: DC

## 2018-11-16 NOTE — Progress Notes (Signed)
Virtual Visit via Video Note   Subjective:   [name], female    DOB: Oct 11, 1970, 48 y.o.   MRN: [id number]   I connected with the patient on 11/16/18 by a video enabled telemedicine application and verified that I am speaking with the correct person using two identifiers.     I discussed the limitations of evaluation and management by telemedicine and the availability of in person appointments. The patient expressed understanding and agreed to proceed.   This visit type was conducted due to national recommendations for restrictions regarding the COVID-19 Pandemic (e.g. social distancing).  This format is felt to be most appropriate for this patient at this time.  All issues noted in this document were discussed and addressed.  No physical exam was performed (except for noted visual exam findings with Tele health visits).  The patient has consented to conduct a Tele health visit and understands insurance will be billed.     Chief complaint:  Leg edema   HPI  48 y/o Caucasian female with hypertension, type 2 DM, morbid obesity,  HFpEF,  tobacco abuse.  She continues to smoke 1/2 pack/day. Weight has been fairly stable. Leg swelling is controlled, when she takes diuretics. She has been walking fairly regularly without any symptoms.   Unfortunately, diabetes remains uncontrolled.   Past Medical History:  Diagnosis Date  . Anxiety    takes Prozac daily  . Anxiety   . Aortic valve calcification   . Asthma    Advair and Spirva daily  . Asthma   . Bipolar disorder ([place])   . CAD (coronary artery disease)    a. LHC 11/2013 done for CP/fluid retention: mild disease in prox LAD, mild-mod disease in mRCA, EF 60% with normal LVEDP. b. Normal nuc 03/2016.  Marland Kitchen CHF (congestive heart failure) ([place])   . Chronic diastolic CHF (congestive heart failure) ([place])   . CKD (chronic kidney disease), stage II   . COPD (chronic obstructive pulmonary disease) (H[facility])    a. nocturnal O2.  Marland Kitchen COPD  (chronic obstructive pulmonary disease) ([place])   . Coronary artery disease   . Decreased urine stream   . Diabetes mellitus   . Diabetes mellitus without complication ([place])   . Dyspnea   . Family history of adverse reaction to anesthesia    mom gets nauseated  . GERD (gastroesophageal reflux disease)    takes Pepcid daily  . History of blood clots    left leg 3-68yrs ago  . Hyperlipidemia   . Hypertension   . Hypertriglyceridemia   . Inguinal hernia, left 01/2015  . Muscle spasm   . Open wound of genital labia   . Peripheral neuropathy   . RBBB   . Seizures ([place])   . Sepsis ([place]) 01/19/2018  . Sinus tachycardia    a. persistent since 2009.  Marland Kitchen Smokers' cough ([place])   . Stroke [facility]) 1989   left sided weakness  . TIA (transient ischemic attack)   . Tobacco abuse   . Vulvar abscess 01/23/2018     Past Surgical History:  Procedure Laterality Date  . HERNIA REPAIR    . INCISION AND DRAINAGE ABSCESS N/A 01/26/2018   Procedure: INCISION AND DEBRIDEMENT OF VULVAR NECROTIZING SOFT TISSUE INFECTION;  Surgeon: Greer Pickerel, MD;  Location: [place];  Service: General;  Laterality: N/A;  . INCISION AND DRAINAGE PERIRECTAL ABSCESS N/A 01/22/2018   Procedure: IRRIGATION AND DEBRIDEMENT LABIAL/VULVAR AREA;  Surgeon: Coralie Keens, MD;  Location: [place];  Service:  General;  Laterality: N/A;  . INCISION AND DRAINAGE PERIRECTAL ABSCESS N/A 01/29/2018   Procedure: IRRIGATION AND DEBRIDEMENT VULVA;  Surgeon: Excell Seltzer, MD;  Location: [place];  Service: General;  Laterality: N/A;  . INGUINAL HERNIA REPAIR Left 04/08/2015   Procedure: OPEN LEFT INGUINAL HERNIA REPAIR WITH MESH;  Surgeon: Ralene Ok, MD;  Location: [place];  Service: General;  Laterality: Left;  . INSERTION OF MESH Left 04/08/2015   Procedure: INSERTION OF MESH;  Surgeon: Ralene Ok, MD;  Location: [place];  Service: General;  Laterality: Left;  . LAPAROSCOPY     Endometriosis  . LEFT HEART CATHETERIZATION WITH CORONARY  ANGIOGRAM N/A 12/05/2013   Procedure: LEFT HEART CATHETERIZATION WITH CORONARY ANGIOGRAM;  Surgeon: Jettie Booze, MD;  Location: [facility] CATH LAB;  Service: Cardiovascular;  Laterality: N/A;  . right kidney drained    . TEE WITHOUT CARDIOVERSION N/A 11/28/2013   Procedure: TRANSESOPHAGEAL ECHOCARDIOGRAM (TEE);  Surgeon: Thayer Headings, MD;  Location: [facility] ENDOSCOPY;  Service: Cardiovascular;  Laterality: N/A;     Social History   Socioeconomic History  . Marital status: Married    Spouse name: Not on file  . Number of children: 2  . Years of education: Not on file  . Highest education level: Not on file  Occupational History  . Not on file  Social Needs  . Financial resource strain: Not on file  . Food insecurity:    Worry: Not on file    Inability: Not on file  . Transportation needs:    Medical: Not on file    Non-medical: Not on file  Tobacco Use  . Smoking status: Current Every Day Smoker    Packs/day: 1.00    Years: 36.00    Pack years: 36.00    Types: Cigarettes    Last attempt to quit: 01/18/2018    Years since quitting: 0.8  . Smokeless tobacco: Never Used  Substance and Sexual Activity  . Alcohol use: No    Frequency: Never  . Drug use: No  . Sexual activity: Not Currently  Lifestyle  . Physical activity:    Days per week: Patient refused    Minutes per session: Patient refused  . Stress: Only a little  Relationships  . Social connections:    Talks on phone: More than three times a week    Gets together: Patient refused    Attends religious service: Patient refused    Active member of club or organization: Patient refused    Attends meetings of clubs or organizations: Patient refused    Relationship status: Married  . Intimate partner violence:    Fear of current or ex partner: Patient refused    Emotionally abused: Patient refused    Physically abused: Patient refused    Forced sexual activity: Patient refused  Other Topics Concern  . Not on file   Social History Narrative   ** Merged History Encounter **       Lives in [place]   Married but lives with Boyfriend - not legally separated   Disabled - for BiPolar, Seizure disorder, diabetes   Formerly worked at [employer]           Family History  Problem Relation Age of Onset  . Venous thrombosis Brother   . Other Brother        BRAIN TUMOR  . Asthma Father   . Diabetes Father   . Coronary artery disease Mother   . Hypertension Mother   . Diabetes  Mother   . Asthma Sister   . Diabetes type II Brother      Current Outpatient Medications on File Prior to Visit  Medication Sig Dispense Refill  . acetaminophen (TYLENOL) 500 MG tablet Take 500 mg by mouth every 4 (four) hours as needed.    Marland Kitchen albuterol (PROVENTIL HFA;VENTOLIN HFA) 108 (90 Base) MCG/ACT inhaler INHALE 2 PUFFS INTO THE LUNGS EVERY 4 HOURS AS NEEDED FOR SHORTNESS OF BREATH 6.7 g 0  . ALPRAZolam (XANAX) 0.5 MG tablet Take 1 tablet (0.5 mg total) by mouth 2 (two) times daily as needed for anxiety.  0  . aspirin EC 81 MG tablet Take 81 mg by mouth every morning.    . budesonide (PULMICORT) 0.5 MG/2ML nebulizer solution Take 0.5 mg by nebulization 2 (two) times daily.    . budesonide-formoterol (SYMBICORT) 80-4.5 MCG/ACT inhaler INHALE 2 PUFFS BY MOUTH EVERY 12 HOURS TO PREVENT COUGH OR WHEEZING *RINSE MOUTH AFTER USING* 10.2 g 2  . busPIRone (BUSPAR) 30 MG tablet Take 30 mg by mouth 2 (two) times daily.    . famotidine (PEPCID) 20 MG tablet TAKE 1 TABLET BY MOUTH TWICE (2) DAILY FOR ACID INDIGESTION (Patient taking differently: Take 20 mg by mouth 2 (two) times daily. ) 180 tablet 3  . FLUoxetine (PROZAC) 40 MG capsule Take 40 mg by mouth daily.     Marland Kitchen glucose blood (ONETOUCH VERIO) test strip Use to monitor glucose levels three times daily; E11.9; E10.42 100 each 12  . Insulin Pen Needle 32G X 4 MM MISC Used to give insulin injections twice daily. 100 each 11  . insulin regular human CONCENTRATED (HUMULIN R U-500  KWIKPEN) 500 UNIT/ML kwikpen 400 units with breakfast and 300 units with supper. 14 pen 11  . ipratropium-albuterol (DUONEB) 0.5-2.5 (3) MG/3ML SOLN Take 3 mLs by nebulization every 4 (four) hours as needed (wheezing/shortness of breath). 360 mL 0  . linaclotide (LINZESS) 290 MCG CAPS capsule Take 1 capsule (290 mcg total) by mouth daily before breakfast. 90 capsule 0  . loperamide (IMODIUM A-D) 2 MG tablet Take 4 mg by mouth 3 (three) times daily as needed for diarrhea or loose stools.    . metFORMIN (GLUCOPHAGE) 1000 MG tablet Take 1,000 mg by mouth 2 (two) times daily with a meal.    . metolazone (ZAROXOLYN) 2.5 MG tablet Take 1 tablet (2.5 mg total) by mouth every other day. Take 2.5 mg by mouth every 48 hours on Tuesday, Thursday and Saturday 10 tablet 0  . OneTouch Delica Lancets 62Z MISC 1 each by Does not apply route 3 (three) times daily. Use to monitor glucose levels three times daily; E11.9; E10.42 100 each 11  . OXYGEN Inhale 4 L into the lungs at bedtime.     . pregabalin (LYRICA) 300 MG capsule Take 1 capsule (300 mg total) by mouth 2 (two) times daily. 180 capsule 0  . PRESCRIPTION MEDICATION Place 1 drop into both eyes daily as needed (every 14 weeks before injections).    . ranitidine (ZANTAC) 150 MG tablet Take 150 mg by mouth 2 (two) times daily.     Marland Kitchen rOPINIRole (REQUIP) 1 MG tablet Take 1 tablet (1 mg total) by mouth at bedtime. For restless legs. 90 tablet 1  . spironolactone (ALDACTONE) 50 MG tablet TAKE 1 TABLET BY MOUTH ONCE DAILY (Patient taking differently: Take 50 mg by mouth daily. ) 30 tablet 3  . tobramycin (TOBREX) 0.3 % ophthalmic solution     . torsemide (  DEMADEX) 20 MG tablet Take 40 mg by mouth 2 (two) times daily.    . traZODone (DESYREL) 50 MG tablet Take 50-150 mg by mouth at bedtime.    Marland Kitchen ULTICARE MINI PEN NEEDLES 31G X 6 MM MISC USE AS DIRECTED THREE TIMES DAILY 100 each 0   No current facility-administered medications on file prior to visit.      Cardiovascular studies:  Echocardiogram 09/14/2018:  1. The left ventricle has normal systolic function, with an ejection fraction of 55-60%. The cavity size was normal. Left ventricular diastology could not be evaluated.  2. The right ventricle has normal systolic function. The cavity was normal. There is no increase in right ventricular wall thickness.  3. Left atrial size was not well visualized.  4. The mitral valve is normal in structure. Mild thickening of the mitral valve leaflet.  5. The tricuspid valve is not well visualized. Tricuspid valve regurgitation was not assessed by color flow Doppler.  6. The aortic valve was not well visualized Aortic valve regurgitation was not assessed by color flow Doppler.  7. The interatrial septum was not well visualized.  Carotid artery duplex 06/26/2018: Stenosis in the right internal carotid artery (50-69%). ICA/CCA ratio of 3.41 suggests upper end of spectrum stenosis of 69%. Mild heterogeneous plaque left. Right vertebral artery is not visualized. Antegrade left vertebral artery flow. Compared to the study done on 12/06/2017, right ICA stenosis was greater than 70%.  Right vertebral artery flows antegrade.  Not well visualized in the present study. Follow up in six months is appropriate if clinically indicated.  Lexiscan Cardilate stress test 03/30/2016:  Normal LVEF, 55-65%.  No evidence of ischemia.  Echocardiogram 11/16/2015:  Dynamic LVEF, 65-70% with pseudo-normal filling pattern.  Mild to moderately calcified aortic valve, trace aortic stenosis with peak gradient 23, mean gradient 11 mmHg.  Recent labs: Results for MICAELLA, GITTO (MRN [id number]) as of 11/16/2018 22:33  Ref. Range 09/13/2018 10:14 09/13/2018 13:53 09/14/2018 03:17  COMPREHENSIVE METABOLIC PANEL Unknown Rpt (A)    Sodium Latest Ref Range: 135 - 145 mmol/L 134 (L) 136   Potassium Latest Ref Range: 3.5 - 5.1 mmol/L 3.6 3.6   Chloride Latest Ref Range: 98 - 111 mmol/L 99    CO2  Latest Ref Range: 22 - 32 mmol/L 25    Glucose Latest Ref Range: 70 - 99 mg/dL 159 (H)    Mean Plasma Glucose Latest Units: mg/dL   257.52  BUN Latest Ref Range: 6 - 20 mg/dL 23 (H)    Creatinine Latest Ref Range: 0.44 - 1.00 mg/dL 1.19 (H)    Calcium Latest Ref Range: 8.9 - 10.3 mg/dL 8.9    Anion gap Latest Ref Range: 5 - 15  10    Calcium Ionized Latest Ref Range: 1.15 - 1.40 mmol/L  1.18   Alkaline Phosphatase Latest Ref Range: 38 - 126 U/L 103    Albumin Latest Ref Range: 3.5 - 5.0 g/dL 3.4 (L)    AST Latest Ref Range: 15 - 41 U/L 21    ALT Latest Ref Range: 0 - 44 U/L 18    Total Protein Latest Ref Range: 6.5 - 8.1 g/dL 8.4 (H)    Total Bilirubin Latest Ref Range: 0.3 - 1.2 mg/dL 1.5 (H)    GFR, Est Non African American Latest Ref Range: >60 mL/min 54 (L)    GFR, Est African American Latest Ref Range: >60 mL/min >60    Total CHOL/HDL Ratio Latest Units: RATIO   7.0  Cholesterol Latest Ref Range: 0 - 200 mg/dL   146  HDL Cholesterol Latest Ref Range: >40 mg/dL   21 (L)  LDL (calc) Latest Ref Range: 0 - 99 mg/dL   98  Triglycerides Latest Ref Range: <150 mg/dL   135  VLDL Latest Ref Range: 0 - 40 mg/dL   27   Results for ARTHUR, AYDELOTTE (MRN [id number]) as of 11/16/2018 22:33  Ref. Range 09/13/2018 10:14 09/13/2018 13:53  WBC Latest Ref Range: 4.0 - 10.5 K/uL 12.4 (H)   RBC Latest Ref Range: 3.87 - 5.11 MIL/uL 4.61   Hemoglobin Latest Ref Range: 12.0 - 15.0 g/dL 10.6 (L) 10.9 (L)  HCT Latest Ref Range: 36.0 - 46.0 % 36.2 32.0 (L)  MCV Latest Ref Range: 80.0 - 100.0 fL 78.5 (L)   MCH Latest Ref Range: 26.0 - 34.0 pg 23.0 (L)   MCHC Latest Ref Range: 30.0 - 36.0 g/dL 29.3 (L)   RDW Latest Ref Range: 11.5 - 15.5 % 21.9 (H)   Platelets Latest Ref Range: 150 - 400 K/uL 200   nRBC Latest Ref Range: 0.0 - 0.2 % 0.0    Results for MAKYNLEE, KRESSIN (MRN [id number]) as of 11/16/2018 22:33  Ref. Range 09/14/2018 03:17  Hemoglobin A1C Latest Ref Range: 4.8 - 5.6 % 10.6 (H)     Review of Systems   Constitution: Negative for decreased appetite, malaise/fatigue, weight gain and weight loss.  HENT: Negative for congestion.   Eyes: Negative for visual disturbance.  Cardiovascular: Positive for leg swelling. Negative for chest pain, dyspnea on exertion, palpitations and syncope.  Respiratory: Negative for shortness of breath.   Endocrine: Negative for cold intolerance.  Hematologic/Lymphatic: Does not bruise/bleed easily.  Skin: Negative for itching and rash.  Musculoskeletal: Negative for myalgias.  Gastrointestinal: Negative for abdominal pain, nausea and vomiting.  Genitourinary: Negative for dysuria.  Neurological: Negative for dizziness and weakness.  Psychiatric/Behavioral: The patient is not nervous/anxious.   All other systems reviewed and are negative.        Vitals:   11/16/18 1027  BP: 128/80  Pulse: 93   (Measured by the patient using a home BP monitor)   Observation/findings during video visit   Objective:    Physical Exam  Constitutional: She is oriented to person, place, and time. She appears well-developed and well-nourished. No distress.  Pulmonary/Chest: Effort normal.  Musculoskeletal:        General: Edema (Tace b/l. Discoloration b/l LE) present.  Neurological: She is alert and oriented to person, place, and time.  Psychiatric: She has a normal mood and affect.  Nursing note and vitals reviewed.         Assessment & Recommendations:   48 y/o Caucasian female with hypertension, type 2 DM, morbid obesity,  HFpEF,  tobacco abuse.  HFpEF: Continue diuretics. Refilled metolazone. Continue spironolactone  Leg edema: Likely combination of HFpEF and venous insufficiency. Recommend compression stockings.   Hypertension: Controlled. Consider adding ACEi  Tobacco abuse: Reemphasized tobacco cessation.  Type 2 DM: Uncontrolled. Continue Aspirin. Consider statin.  I will see her bac in 3 months   Linley Moxley Esther Hardy, MD [facility]  [facility]. [place] Pager: [phone] Office: [phone]

## 2018-11-20 DIAGNOSIS — K219 Gastro-esophageal reflux disease without esophagitis: Secondary | ICD-10-CM | POA: Diagnosis not present

## 2018-11-20 DIAGNOSIS — I2510 Atherosclerotic heart disease of native coronary artery without angina pectoris: Secondary | ICD-10-CM | POA: Diagnosis not present

## 2018-11-20 DIAGNOSIS — F17210 Nicotine dependence, cigarettes, uncomplicated: Secondary | ICD-10-CM | POA: Diagnosis not present

## 2018-11-20 DIAGNOSIS — E1142 Type 2 diabetes mellitus with diabetic polyneuropathy: Secondary | ICD-10-CM | POA: Diagnosis not present

## 2018-11-20 DIAGNOSIS — J449 Chronic obstructive pulmonary disease, unspecified: Secondary | ICD-10-CM | POA: Diagnosis not present

## 2018-11-20 DIAGNOSIS — I4510 Unspecified right bundle-branch block: Secondary | ICD-10-CM | POA: Diagnosis not present

## 2018-11-20 DIAGNOSIS — Z7982 Long term (current) use of aspirin: Secondary | ICD-10-CM | POA: Diagnosis not present

## 2018-11-20 DIAGNOSIS — I69354 Hemiplegia and hemiparesis following cerebral infarction affecting left non-dominant side: Secondary | ICD-10-CM | POA: Diagnosis not present

## 2018-11-20 DIAGNOSIS — I5032 Chronic diastolic (congestive) heart failure: Secondary | ICD-10-CM | POA: Diagnosis not present

## 2018-11-20 DIAGNOSIS — I130 Hypertensive heart and chronic kidney disease with heart failure and stage 1 through stage 4 chronic kidney disease, or unspecified chronic kidney disease: Secondary | ICD-10-CM | POA: Diagnosis not present

## 2018-11-20 DIAGNOSIS — N182 Chronic kidney disease, stage 2 (mild): Secondary | ICD-10-CM | POA: Diagnosis not present

## 2018-11-20 DIAGNOSIS — F319 Bipolar disorder, unspecified: Secondary | ICD-10-CM | POA: Diagnosis not present

## 2018-11-20 DIAGNOSIS — G40909 Epilepsy, unspecified, not intractable, without status epilepticus: Secondary | ICD-10-CM | POA: Diagnosis not present

## 2018-11-20 DIAGNOSIS — E1122 Type 2 diabetes mellitus with diabetic chronic kidney disease: Secondary | ICD-10-CM | POA: Diagnosis not present

## 2018-11-20 DIAGNOSIS — Z794 Long term (current) use of insulin: Secondary | ICD-10-CM | POA: Diagnosis not present

## 2018-11-20 DIAGNOSIS — F419 Anxiety disorder, unspecified: Secondary | ICD-10-CM | POA: Diagnosis not present

## 2018-11-26 DIAGNOSIS — F419 Anxiety disorder, unspecified: Secondary | ICD-10-CM | POA: Diagnosis not present

## 2018-11-26 DIAGNOSIS — F17210 Nicotine dependence, cigarettes, uncomplicated: Secondary | ICD-10-CM | POA: Diagnosis not present

## 2018-11-26 DIAGNOSIS — E1142 Type 2 diabetes mellitus with diabetic polyneuropathy: Secondary | ICD-10-CM | POA: Diagnosis not present

## 2018-11-26 DIAGNOSIS — Z7982 Long term (current) use of aspirin: Secondary | ICD-10-CM | POA: Diagnosis not present

## 2018-11-26 DIAGNOSIS — I69354 Hemiplegia and hemiparesis following cerebral infarction affecting left non-dominant side: Secondary | ICD-10-CM | POA: Diagnosis not present

## 2018-11-26 DIAGNOSIS — I130 Hypertensive heart and chronic kidney disease with heart failure and stage 1 through stage 4 chronic kidney disease, or unspecified chronic kidney disease: Secondary | ICD-10-CM | POA: Diagnosis not present

## 2018-11-26 DIAGNOSIS — I2510 Atherosclerotic heart disease of native coronary artery without angina pectoris: Secondary | ICD-10-CM | POA: Diagnosis not present

## 2018-11-26 DIAGNOSIS — G40909 Epilepsy, unspecified, not intractable, without status epilepticus: Secondary | ICD-10-CM | POA: Diagnosis not present

## 2018-11-26 DIAGNOSIS — I4510 Unspecified right bundle-branch block: Secondary | ICD-10-CM | POA: Diagnosis not present

## 2018-11-26 DIAGNOSIS — J449 Chronic obstructive pulmonary disease, unspecified: Secondary | ICD-10-CM | POA: Diagnosis not present

## 2018-11-26 DIAGNOSIS — K219 Gastro-esophageal reflux disease without esophagitis: Secondary | ICD-10-CM | POA: Diagnosis not present

## 2018-11-26 DIAGNOSIS — N182 Chronic kidney disease, stage 2 (mild): Secondary | ICD-10-CM | POA: Diagnosis not present

## 2018-11-26 DIAGNOSIS — I5032 Chronic diastolic (congestive) heart failure: Secondary | ICD-10-CM | POA: Diagnosis not present

## 2018-11-26 DIAGNOSIS — Z794 Long term (current) use of insulin: Secondary | ICD-10-CM | POA: Diagnosis not present

## 2018-11-26 DIAGNOSIS — E1122 Type 2 diabetes mellitus with diabetic chronic kidney disease: Secondary | ICD-10-CM | POA: Diagnosis not present

## 2018-11-26 DIAGNOSIS — F319 Bipolar disorder, unspecified: Secondary | ICD-10-CM | POA: Diagnosis not present

## 2018-11-30 ENCOUNTER — Other Ambulatory Visit: Payer: Self-pay | Admitting: Primary Care

## 2018-11-30 DIAGNOSIS — J449 Chronic obstructive pulmonary disease, unspecified: Secondary | ICD-10-CM

## 2018-11-30 NOTE — Telephone Encounter (Signed)
Last prescribed on 05/21/2018. Last office visit on 10/24/2018. Next future appointment on 12/17/2018

## 2018-11-30 NOTE — Telephone Encounter (Signed)
Noted, refill sent to pharmacy. 

## 2018-12-04 DIAGNOSIS — Z7982 Long term (current) use of aspirin: Secondary | ICD-10-CM | POA: Diagnosis not present

## 2018-12-04 DIAGNOSIS — J449 Chronic obstructive pulmonary disease, unspecified: Secondary | ICD-10-CM | POA: Diagnosis not present

## 2018-12-04 DIAGNOSIS — I69354 Hemiplegia and hemiparesis following cerebral infarction affecting left non-dominant side: Secondary | ICD-10-CM | POA: Diagnosis not present

## 2018-12-04 DIAGNOSIS — E1142 Type 2 diabetes mellitus with diabetic polyneuropathy: Secondary | ICD-10-CM | POA: Diagnosis not present

## 2018-12-04 DIAGNOSIS — K219 Gastro-esophageal reflux disease without esophagitis: Secondary | ICD-10-CM | POA: Diagnosis not present

## 2018-12-04 DIAGNOSIS — G40909 Epilepsy, unspecified, not intractable, without status epilepticus: Secondary | ICD-10-CM | POA: Diagnosis not present

## 2018-12-04 DIAGNOSIS — E1122 Type 2 diabetes mellitus with diabetic chronic kidney disease: Secondary | ICD-10-CM | POA: Diagnosis not present

## 2018-12-04 DIAGNOSIS — I130 Hypertensive heart and chronic kidney disease with heart failure and stage 1 through stage 4 chronic kidney disease, or unspecified chronic kidney disease: Secondary | ICD-10-CM | POA: Diagnosis not present

## 2018-12-04 DIAGNOSIS — I5032 Chronic diastolic (congestive) heart failure: Secondary | ICD-10-CM | POA: Diagnosis not present

## 2018-12-04 DIAGNOSIS — F419 Anxiety disorder, unspecified: Secondary | ICD-10-CM | POA: Diagnosis not present

## 2018-12-04 DIAGNOSIS — F17210 Nicotine dependence, cigarettes, uncomplicated: Secondary | ICD-10-CM | POA: Diagnosis not present

## 2018-12-04 DIAGNOSIS — I4510 Unspecified right bundle-branch block: Secondary | ICD-10-CM | POA: Diagnosis not present

## 2018-12-04 DIAGNOSIS — F319 Bipolar disorder, unspecified: Secondary | ICD-10-CM | POA: Diagnosis not present

## 2018-12-04 DIAGNOSIS — N182 Chronic kidney disease, stage 2 (mild): Secondary | ICD-10-CM | POA: Diagnosis not present

## 2018-12-04 DIAGNOSIS — Z794 Long term (current) use of insulin: Secondary | ICD-10-CM | POA: Diagnosis not present

## 2018-12-04 DIAGNOSIS — I2510 Atherosclerotic heart disease of native coronary artery without angina pectoris: Secondary | ICD-10-CM | POA: Diagnosis not present

## 2018-12-06 ENCOUNTER — Telehealth: Payer: Self-pay | Admitting: Primary Care

## 2018-12-06 DIAGNOSIS — K219 Gastro-esophageal reflux disease without esophagitis: Secondary | ICD-10-CM

## 2018-12-06 NOTE — Telephone Encounter (Signed)
Can she get Pepcid from another pharmacy?

## 2018-12-06 NOTE — Telephone Encounter (Signed)
Please clarify, Pepcid has been pulled from the shelves now? Or is it just this specific strength? Are the 10 mg or 40 mg tablets available?

## 2018-12-06 NOTE — Telephone Encounter (Signed)
Landon from [facility] called concerning famotidine refill. She is requesting a change b/c this has been pulled from the market and one is on backorder. Pt called them into pharmacy today.

## 2018-12-06 NOTE — Telephone Encounter (Signed)
Spoken to [facility]. The company where they get Pepcid is on back order. So they have been calling doctor's office to have a replacement.

## 2018-12-07 MED ORDER — FAMOTIDINE 20 MG PO TABS: 20.0000 mg | ORAL_TABLET | Freq: Two times a day (BID) | ORAL | 0 refills | Status: DC

## 2018-12-07 NOTE — Addendum Note (Signed)
Addended by: Pleas Koch on: 12/07/2018 04:10 PM   Modules accepted: Orders

## 2018-12-07 NOTE — Telephone Encounter (Signed)
Noted, Rx sent to [facility].  

## 2018-12-07 NOTE — Telephone Encounter (Signed)
Patient stated okay to see to [facility] in [facility]

## 2018-12-17 ENCOUNTER — Ambulatory Visit (INDEPENDENT_AMBULATORY_CARE_PROVIDER_SITE_OTHER): Payer: BC Managed Care – PPO | Admitting: Primary Care

## 2018-12-17 DIAGNOSIS — J449 Chronic obstructive pulmonary disease, unspecified: Secondary | ICD-10-CM | POA: Diagnosis not present

## 2018-12-17 DIAGNOSIS — N182 Chronic kidney disease, stage 2 (mild): Secondary | ICD-10-CM

## 2018-12-17 DIAGNOSIS — I2510 Atherosclerotic heart disease of native coronary artery without angina pectoris: Secondary | ICD-10-CM | POA: Diagnosis not present

## 2018-12-17 DIAGNOSIS — I10 Essential (primary) hypertension: Secondary | ICD-10-CM | POA: Diagnosis not present

## 2018-12-17 DIAGNOSIS — E0822 Diabetes mellitus due to underlying condition with diabetic chronic kidney disease: Secondary | ICD-10-CM | POA: Diagnosis not present

## 2018-12-17 DIAGNOSIS — I509 Heart failure, unspecified: Secondary | ICD-10-CM

## 2018-12-17 DIAGNOSIS — Z794 Long term (current) use of insulin: Secondary | ICD-10-CM

## 2018-12-17 DIAGNOSIS — G2581 Restless legs syndrome: Secondary | ICD-10-CM | POA: Diagnosis not present

## 2018-12-17 DIAGNOSIS — M79605 Pain in left leg: Secondary | ICD-10-CM | POA: Diagnosis not present

## 2018-12-17 DIAGNOSIS — G8929 Other chronic pain: Secondary | ICD-10-CM

## 2018-12-17 DIAGNOSIS — M792 Neuralgia and neuritis, unspecified: Secondary | ICD-10-CM

## 2018-12-17 DIAGNOSIS — K5900 Constipation, unspecified: Secondary | ICD-10-CM | POA: Diagnosis not present

## 2018-12-17 DIAGNOSIS — M79604 Pain in right leg: Secondary | ICD-10-CM | POA: Diagnosis not present

## 2018-12-17 DIAGNOSIS — I6521 Occlusion and stenosis of right carotid artery: Secondary | ICD-10-CM

## 2018-12-17 DIAGNOSIS — F319 Bipolar disorder, unspecified: Secondary | ICD-10-CM

## 2018-12-17 MED ORDER — INSULIN REGULAR HUMAN (CONC) 500 UNIT/ML ~~LOC~~ SOPN: 250.0000 [IU] | PEN_INJECTOR | Freq: Two times a day (BID) | SUBCUTANEOUS | 11 refills | Status: DC

## 2018-12-17 NOTE — Assessment & Plan Note (Signed)
Recent BP stable.  Continue prescribed regimen. Following with cardiology.

## 2018-12-17 NOTE — Assessment & Plan Note (Signed)
Following with cardiology. Asymptomatic.

## 2018-12-17 NOTE — Patient Instructions (Signed)
Continue to follow up with your cardiologist, pulmonologist, psychiatrist, and endocrinologist.    Limit computer time during the day as this is likely provoking your migraines/headaches. Take a break every hour for about one hour.  Work on eating a healthy diet and try to get some regular exercise.  Please schedule a follow up appointment in 6 months.  Allie Bossier, NP-C

## 2018-12-17 NOTE — Assessment & Plan Note (Signed)
Taking Linzess as needed. Continue same.

## 2018-12-17 NOTE — Assessment & Plan Note (Signed)
Following with cardiology, compliant to prescribed regimen. Medication list is up to date. Encouraged daily weights. Strongly advised she work on weight loss through healthy diet and exercise.

## 2018-12-17 NOTE — Assessment & Plan Note (Signed)
Doing "okay" on Lyrica 300 mg BID. Given numerous other sedating medications on her list we will continue at 300 mg BID rather than increasing to TID.

## 2018-12-17 NOTE — Assessment & Plan Note (Signed)
Compliant to Symbicort and Duoneb treatments. Following with pulmonology. Continue same.

## 2018-12-17 NOTE — Assessment & Plan Note (Signed)
Continues to take Requip, doing well. Continue same.

## 2018-12-17 NOTE — Assessment & Plan Note (Signed)
Doing okay on Lyrica 300 mg BID. Would like to eliminate this medication as she's already on so many other sedating medications. She would like to continue at BID as she does still have breakthrough neuropathy. Continue same for now.

## 2018-12-17 NOTE — Progress Notes (Signed)
Subjective:    Patient ID: [name], female    DOB: 1970/10/07, 48 y.o.   MRN: [id number]  HPI  Virtual Visit via Video Note  I connected with [name] on 12/17/18 at  3:20 PM EDT by a video enabled telemedicine application and verified that I am speaking with the correct person using two identifiers.  Location: Patient: Home Provider: Office   I discussed the limitations of evaluation and management by telemedicine and the availability of in person appointments. The patient expressed understanding and agreed to proceed.  History of Present Illness:  [name] is a 48 year old female who presents today for follow up of chronic conditions.  Overall doing okay is experiencing headaches.  Headaches began about one month ago to the bilateral temporal region, intermittent. Over the last one week she's had bilateral occipital headache with radiation to her parietal lobes. She also reports photophobia, denies nausea. She has been taking Excedrin Migraine with temporary improvement. She has been under a lot of stress and is also on her computer for most of the day until bedtime.   She continues to follow with her cardiologist, psychiatrist, and endocrinologist. Her endocrinologist changed her insulin to 250 units twice daily. Anxiety has been waxing and waning, her psychiatrist is working with her. No changes in her asthma treatment plan.   BP Readings from Last 3 Encounters:  11/16/18 128/80  09/25/18 112/66  09/21/18 104/60      Observations/Objective:  Alert and oriented. Appears well, not sickly. No distress. Speaking in complete sentences.   Assessment and Plan:  See problem based charting.  Follow Up Instructions:  Continue to follow up with your cardiologist, pulmonologist, psychiatrist, and endocrinologist.    Limit computer time during the day as this is likely provoking your migraines/headaches. Take a break every hour for about one hour.  Work on  eating a healthy diet and try to get some regular exercise.  Please schedule a follow up appointment in 6 months.  Allie Bossier, NP-C   I discussed the assessment and treatment plan with the patient. The patient was provided an opportunity to ask questions and all were answered. The patient agreed with the plan and demonstrated an understanding of the instructions.   The patient was advised to call back or seek an in-person evaluation if the symptoms worsen or if the condition fails to improve as anticipated.     Pleas Koch, NP    Review of Systems  Constitutional: Negative for fever.  Respiratory:       Denies increased SOB.  Cardiovascular: Negative for chest pain and palpitations.  Gastrointestinal: Negative for abdominal pain and nausea.  Neurological: Positive for headaches. Negative for dizziness.  Psychiatric/Behavioral:       Some increased anxiety, following with psychiatry.        Past Medical History:  Diagnosis Date  . Anxiety    takes Prozac daily  . Anxiety   . Aortic valve calcification   . Asthma    Advair and Spirva daily  . Asthma   . Bipolar disorder ([place])   . CAD (coronary artery disease)    a. LHC 11/2013 done for CP/fluid retention: mild disease in prox LAD, mild-mod disease in mRCA, EF 60% with normal LVEDP. b. Normal nuc 03/2016.  Marland Kitchen CHF (congestive heart failure) ([place])   . Chronic diastolic CHF (congestive heart failure) ([place])   . CKD (chronic kidney disease), stage II   . COPD (chronic obstructive pulmonary disease) (  [place])    a. nocturnal O2.  Marland Kitchen COPD (chronic obstructive pulmonary disease) ([place])   . Coronary artery disease   . Decreased urine stream   . Diabetes mellitus   . Diabetes mellitus without complication ([place])   . Dyspnea   . Family history of adverse reaction to anesthesia    mom gets nauseated  . GERD (gastroesophageal reflux disease)    takes Pepcid daily  . History of blood clots    left leg 3-41yrs ago  . Hyperlipidemia    . Hypertension   . Hypertriglyceridemia   . Inguinal hernia, left 01/2015  . Muscle spasm   . Open wound of genital labia   . Peripheral neuropathy   . RBBB   . Seizures ([place])   . Sepsis ([place]) 01/19/2018  . Sinus tachycardia    a. persistent since 2009.  Marland Kitchen Smokers' cough ([place])   . Stroke [facility]) 1989   left sided weakness  . TIA (transient ischemic attack)   . Tobacco abuse   . Vulvar abscess 01/23/2018     Social History   Socioeconomic History  . Marital status: Married    Spouse name: Not on file  . Number of children: 2  . Years of education: Not on file  . Highest education level: Not on file  Occupational History  . Not on file  Social Needs  . Financial resource strain: Not on file  . Food insecurity:    Worry: Not on file    Inability: Not on file  . Transportation needs:    Medical: Not on file    Non-medical: Not on file  Tobacco Use  . Smoking status: Current Some Day Smoker    Packs/day: 0.50    Years: 36.00    Pack years: 18.00    Types: Cigarettes    Last attempt to quit: 01/18/2018    Years since quitting: 0.9  . Smokeless tobacco: Never Used  Substance and Sexual Activity  . Alcohol use: No    Frequency: Never  . Drug use: No  . Sexual activity: Not Currently  Lifestyle  . Physical activity:    Days per week: Patient refused    Minutes per session: Patient refused  . Stress: Only a little  Relationships  . Social connections:    Talks on phone: More than three times a week    Gets together: Patient refused    Attends religious service: Patient refused    Active member of club or organization: Patient refused    Attends meetings of clubs or organizations: Patient refused    Relationship status: Married  . Intimate partner violence:    Fear of current or ex partner: Patient refused    Emotionally abused: Patient refused    Physically abused: Patient refused    Forced sexual activity: Patient refused  Other Topics Concern  . Not on file   Social History Narrative   ** Merged History Encounter **       Lives in [place]   Married but lives with Boyfriend - not legally separated   Disabled - for BiPolar, Seizure disorder, diabetes   Formerly worked at [employer]          Past Surgical History:  Procedure Laterality Date  . HERNIA REPAIR    . INCISION AND DRAINAGE ABSCESS N/A 01/26/2018   Procedure: INCISION AND DEBRIDEMENT OF VULVAR NECROTIZING SOFT TISSUE INFECTION;  Surgeon: Greer Pickerel, MD;  Location: [place];  Service: General;  Laterality: N/A;  .  INCISION AND DRAINAGE PERIRECTAL ABSCESS N/A 01/22/2018   Procedure: IRRIGATION AND DEBRIDEMENT LABIAL/VULVAR AREA;  Surgeon: Coralie Keens, MD;  Location: [place];  Service: General;  Laterality: N/A;  . INCISION AND DRAINAGE PERIRECTAL ABSCESS N/A 01/29/2018   Procedure: IRRIGATION AND DEBRIDEMENT VULVA;  Surgeon: Excell Seltzer, MD;  Location: [facility];  Service: General;  Laterality: N/A;  . INGUINAL HERNIA REPAIR Left 04/08/2015   Procedure: OPEN LEFT INGUINAL HERNIA REPAIR WITH MESH;  Surgeon: Ralene Ok, MD;  Location: [facility];  Service: General;  Laterality: Left;  . INSERTION OF MESH Left 04/08/2015   Procedure: INSERTION OF MESH;  Surgeon: Ralene Ok, MD;  Location: [place];  Service: General;  Laterality: Left;  . LAPAROSCOPY     Endometriosis  . LEFT HEART CATHETERIZATION WITH CORONARY ANGIOGRAM N/A 12/05/2013   Procedure: LEFT HEART CATHETERIZATION WITH CORONARY ANGIOGRAM;  Surgeon: Jettie Booze, MD;  Location: [facility] CATH LAB;  Service: Cardiovascular;  Laterality: N/A;  . right kidney drained    . TEE WITHOUT CARDIOVERSION N/A 11/28/2013   Procedure: TRANSESOPHAGEAL ECHOCARDIOGRAM (TEE);  Surgeon: Thayer Headings, MD;  Location: [facility] ENDOSCOPY;  Service: Cardiovascular;  Laterality: N/A;    Family History  Problem Relation Age of Onset  . Venous thrombosis Brother   . Other Brother        BRAIN TUMOR  . Asthma Father   . Diabetes Father   . Coronary  artery disease Mother   . Hypertension Mother   . Diabetes Mother   . Asthma Sister   . Diabetes type II Brother     Allergies  Allergen Reactions  . Adhesive [Tape] Rash and Other (See Comments)    TAKES OFF THE SKIN (CERTAIN MEDICAL TAPES DO THIS!!)  . Metoprolol Shortness Of Breath    Occurrence of shortness of breath after 3 days  . Montelukast Shortness Of Breath  . Morphine Sulfate Anaphylaxis, Shortness Of Breath and Nausea And Vomiting    Swollen Throat - Able to tolerate dilaudid  . Penicillins Anaphylaxis, Hives and Shortness Of Breath    Throat swells Has patient had a PCN reaction causing immediate rash, facial/tongue/throat swelling, SOB or lightheadedness with hypotension: Yes Has patient had a PCN reaction causing severe rash involving mucus membranes or skin necrosis: No Has patient had a PCN reaction that required hospitalization: Yes Has patient had a PCN reaction occurring within the last 10 years: No If all of the above answers are "NO", then may proceed with Cephalosporin use.   . Prednisone Anaphylaxis  . Diltiazem Swelling  . Gabapentin Swelling    Current Outpatient Medications on File Prior to Visit  Medication Sig Dispense Refill  . acetaminophen (TYLENOL) 500 MG tablet Take 500 mg by mouth every 4 (four) hours as needed.    Marland Kitchen albuterol (PROVENTIL HFA;VENTOLIN HFA) 108 (90 Base) MCG/ACT inhaler INHALE 2 PUFFS INTO THE LUNGS EVERY 4 HOURS AS NEEDED FOR SHORTNESS OF BREATH 6.7 g 0  . ALPRAZolam (XANAX) 0.5 MG tablet Take 1 tablet (0.5 mg total) by mouth 2 (two) times daily as needed for anxiety.  0  . aspirin EC 81 MG tablet Take 81 mg by mouth every morning.    . budesonide (PULMICORT) 0.5 MG/2ML nebulizer solution Take 0.5 mg by nebulization 2 (two) times daily.    . budesonide-formoterol (SYMBICORT) 80-4.5 MCG/ACT inhaler INHALE 2 PUFFS BY MOUTH EVERY 12 HOURS TO PREVENT COUGH OR WHEEZING *RINSE MOUTH AFTER USE* 10.2 g 2  . busPIRone (BUSPAR) 30 MG  tablet Take 30 mg by mouth 2 (two) times daily.    . famotidine (PEPCID) 20 MG tablet Take 1 tablet (20 mg total) by mouth 2 (two) times daily. For heartburn. 180 tablet 0  . FLUoxetine (PROZAC) 40 MG capsule Take 40 mg by mouth daily.     Marland Kitchen glucose blood (ONETOUCH VERIO) test strip Use to monitor glucose levels three times daily; E11.9; E10.42 100 each 12  . Insulin Pen Needle 32G X 4 MM MISC Used to give insulin injections twice daily. 100 each 11  . ipratropium-albuterol (DUONEB) 0.5-2.5 (3) MG/3ML SOLN Take 3 mLs by nebulization every 4 (four) hours as needed (wheezing/shortness of breath). 360 mL 0  . loperamide (IMODIUM A-D) 2 MG tablet Take 4 mg by mouth 3 (three) times daily as needed for diarrhea or loose stools.    . metolazone (ZAROXOLYN) 2.5 MG tablet Take 1 tablet (2.5 mg total) by mouth every other day. Take 2.5 mg by mouth every 48 hours on Tuesday, Thursday and Saturday 90 tablet 2  . OneTouch Delica Lancets 97L MISC 1 each by Does not apply route 3 (three) times daily. Use to monitor glucose levels three times daily; E11.9; E10.42 100 each 11  . OXYGEN Inhale 4 L into the lungs at bedtime.     . pregabalin (LYRICA) 300 MG capsule Take 1 capsule (300 mg total) by mouth 2 (two) times daily. 180 capsule 0  . PRESCRIPTION MEDICATION Place 1 drop into both eyes daily as needed (every 14 weeks before injections).    Marland Kitchen rOPINIRole (REQUIP) 1 MG tablet Take 1 tablet (1 mg total) by mouth at bedtime. For restless legs. 90 tablet 1  . spironolactone (ALDACTONE) 50 MG tablet TAKE 1 TABLET BY MOUTH ONCE DAILY (Patient taking differently: Take 50 mg by mouth daily. ) 30 tablet 3  . tobramycin (TOBREX) 0.3 % ophthalmic solution     . torsemide (DEMADEX) 20 MG tablet Take 40 mg by mouth 2 (two) times daily.    . traZODone (DESYREL) 100 MG tablet 100 mg at bedtime. 2 tabs bid    . ULTICARE MINI PEN NEEDLES 31G X 6 MM MISC USE AS DIRECTED THREE TIMES DAILY 100 each 0  . linaclotide (LINZESS) 290 MCG  CAPS capsule Take 1 capsule (290 mcg total) by mouth daily before breakfast. 90 capsule 0   No current facility-administered medications on file prior to visit.     Wt 286 lb (129.7 kg)   LMP 11/11/2011   BMI 49.09 kg/m    Objective:   Physical Exam  Constitutional: She is oriented to person, place, and time. She appears well-nourished.  Cardiovascular:  Appears euvolemic   Respiratory: Effort normal.  Neurological: She is alert and oriented to person, place, and time.  Skin: Skin is dry.  Psychiatric: She has a normal mood and affect.           Assessment & Plan:

## 2018-12-17 NOTE — Assessment & Plan Note (Signed)
Following with endocrinology, compliant to insulin. Encouraged regular activity, healthy diet.

## 2018-12-17 NOTE — Assessment & Plan Note (Signed)
Following with psychiatry.  Continue current regimen. 

## 2018-12-20 ENCOUNTER — Other Ambulatory Visit: Payer: BLUE CROSS/BLUE SHIELD

## 2018-12-21 ENCOUNTER — Telehealth: Payer: Self-pay

## 2018-12-21 NOTE — Telephone Encounter (Signed)
LOV 11/02/18. Per Dr. Loanne Drilling, f/u in 1 wk. Called pt to schedule appts. LVM requesting returned call.

## 2018-12-24 ENCOUNTER — Other Ambulatory Visit: Payer: Self-pay | Admitting: Cardiology

## 2018-12-26 ENCOUNTER — Other Ambulatory Visit: Payer: BC Managed Care – PPO

## 2018-12-29 ENCOUNTER — Other Ambulatory Visit: Payer: Self-pay | Admitting: Primary Care

## 2018-12-29 DIAGNOSIS — G2581 Restless legs syndrome: Secondary | ICD-10-CM

## 2018-12-31 ENCOUNTER — Telehealth: Payer: Self-pay | Admitting: Primary Care

## 2018-12-31 NOTE — Telephone Encounter (Signed)
Given her kidney function and general medical history I recommend she avoid all pre and post workout drinks and supplements.

## 2018-12-31 NOTE — Telephone Encounter (Signed)
Best number [phone] Pt called wanting to know if ok for her to take  Steel waty pro 27 or 37 gram protein per scoop  and steel #7 nitric oxide pill.

## 2019-01-01 NOTE — Telephone Encounter (Signed)
I recommend a healthy diet with plenty of vegetables, fruit, lean protein. No junk food, fried/fast food, processed food. I also recommend regular exercise, start with walking for 5 minutes daily and work up when able. Given her medical conditions and all of her medications there is nothing I can recommend that wouldn't interact with her regimen.

## 2019-01-01 NOTE — Telephone Encounter (Signed)
Message left for patient to return my call.  

## 2019-01-01 NOTE — Telephone Encounter (Signed)
Spoken and notified patient of [name] comments. Patient verbalized understanding. Patient stated then what can she take to help her lose weight. She ask if there is something OTC or Rx to give her energy to lose her weight. Please advise.

## 2019-01-02 NOTE — Telephone Encounter (Signed)
Spoken and notified patient of [name] Clark's comments. Patient verbalized understanding.  

## 2019-01-15 ENCOUNTER — Other Ambulatory Visit: Payer: Self-pay

## 2019-01-17 ENCOUNTER — Ambulatory Visit: Payer: Medicare Other | Admitting: Endocrinology

## 2019-01-17 DIAGNOSIS — Z0289 Encounter for other administrative examinations: Secondary | ICD-10-CM

## 2019-01-18 DIAGNOSIS — H4311 Vitreous hemorrhage, right eye: Secondary | ICD-10-CM | POA: Diagnosis not present

## 2019-01-18 DIAGNOSIS — E113513 Type 2 diabetes mellitus with proliferative diabetic retinopathy with macular edema, bilateral: Secondary | ICD-10-CM | POA: Diagnosis not present

## 2019-01-18 DIAGNOSIS — H43811 Vitreous degeneration, right eye: Secondary | ICD-10-CM | POA: Diagnosis not present

## 2019-01-18 DIAGNOSIS — H2513 Age-related nuclear cataract, bilateral: Secondary | ICD-10-CM | POA: Diagnosis not present

## 2019-01-21 ENCOUNTER — Other Ambulatory Visit: Payer: Self-pay | Admitting: Primary Care

## 2019-01-21 DIAGNOSIS — J449 Chronic obstructive pulmonary disease, unspecified: Secondary | ICD-10-CM

## 2019-01-24 ENCOUNTER — Ambulatory Visit: Payer: Medicare Other | Admitting: Podiatry

## 2019-01-29 ENCOUNTER — Other Ambulatory Visit: Payer: Self-pay | Admitting: Primary Care

## 2019-01-29 DIAGNOSIS — E1042 Type 1 diabetes mellitus with diabetic polyneuropathy: Secondary | ICD-10-CM

## 2019-01-29 DIAGNOSIS — M792 Neuralgia and neuritis, unspecified: Secondary | ICD-10-CM

## 2019-01-29 DIAGNOSIS — G629 Polyneuropathy, unspecified: Secondary | ICD-10-CM

## 2019-01-29 NOTE — Telephone Encounter (Signed)
Last prescribed on 10/24/2018. Last appointment on 12/17/2018. No future appointment

## 2019-01-30 NOTE — Telephone Encounter (Signed)
Noted.  Refill sent to pharmacy. 

## 2019-02-05 ENCOUNTER — Telehealth: Payer: Self-pay

## 2019-02-05 NOTE — Telephone Encounter (Signed)
LVM for pt's nurse [phone], saying to take an additional torsemide for 3 days

## 2019-02-05 NOTE — Telephone Encounter (Signed)
Pt's nurse called and said pt has been complaining on swelling for a few days. Wants to know if she can take another Torsemide to help with swelling?

## 2019-02-05 NOTE — Telephone Encounter (Signed)
Yes. Take one extra for three days. I will discuss further at next office visit in August.  [name]

## 2019-02-20 ENCOUNTER — Telehealth: Payer: BC Managed Care – PPO | Admitting: Cardiology

## 2019-02-20 NOTE — Progress Notes (Deleted)
Virtual Visit via Video Note   Subjective:   [name], female    DOB: [birth year], 48 y.o.   MRN: [id number]   I connected with the patient on 02/20/19 by a video enabled telemedicine application and verified that I am speaking with the correct person using two identifiers.     I discussed the limitations of evaluation and management by telemedicine and the availability of in person appointments. The patient expressed understanding and agreed to proceed.   This visit type was conducted due to national recommendations for restrictions regarding the COVID-19 Pandemic (e.g. social distancing).  This format is felt to be most appropriate for this patient at this time.  All issues noted in this document were discussed and addressed.  No physical exam was performed (except for noted visual exam findings with Tele health visits).  The patient has consented to conduct a Tele health visit and understands insurance will be billed.     Chief complaint:  Leg edema   HPI  48 y/o Caucasian female with hypertension, type 2 DM, morbid obesity,  HFpEF,  tobacco abuse.  *** She continues to smoke 1/2 pack/day. Weight has been fairly stable. Leg swelling is controlled, when she takes diuretics. She has been walking fairly regularly without any symptoms.   Unfortunately, diabetes remains uncontrolled.   Past Medical History:  Diagnosis Date  . Anxiety    takes Prozac daily  . Anxiety   . Aortic valve calcification   . Asthma    Advair and Spirva daily  . Asthma   . Bipolar disorder ([place])   . CAD (coronary artery disease)    a. LHC 11/2013 done for CP/fluid retention: mild disease in prox LAD, mild-mod disease in mRCA, EF 60% with normal LVEDP. b. Normal nuc 03/2016.  Marland Kitchen CHF (congestive heart failure) ([place])   . Chronic diastolic CHF (congestive heart failure) ([place])   . CKD (chronic kidney disease), stage II   . COPD (chronic obstructive pulmonary disease) (H[facility])    a. nocturnal O2.  Marland Kitchen COPD  (chronic obstructive pulmonary disease) ([place])   . Coronary artery disease   . Decreased urine stream   . Diabetes mellitus   . Diabetes mellitus without complication ([place])   . Dyspnea   . Family history of adverse reaction to anesthesia    mom gets nauseated  . GERD (gastroesophageal reflux disease)    takes Pepcid daily  . History of blood clots    left leg 3-62yrs ago  . Hyperlipidemia   . Hypertension   . Hypertriglyceridemia   . Inguinal hernia, left 01/2015  . Muscle spasm   . Open wound of genital labia   . Peripheral neuropathy   . RBBB   . Seizures ([place])   . Sepsis ([place]) 01/19/2018  . Sinus tachycardia    a. persistent since 2009.  Marland Kitchen Smokers' cough ([place])   . Stroke [facility]) 1989   left sided weakness  . TIA (transient ischemic attack)   . Tobacco abuse   . Vulvar abscess 01/23/2018     Past Surgical History:  Procedure Laterality Date  . HERNIA REPAIR    . INCISION AND DRAINAGE ABSCESS N/A 01/26/2018   Procedure: INCISION AND DEBRIDEMENT OF VULVAR NECROTIZING SOFT TISSUE INFECTION;  Surgeon: Greer Pickerel, MD;  Location: [place];  Service: General;  Laterality: N/A;  . INCISION AND DRAINAGE PERIRECTAL ABSCESS N/A 01/22/2018   Procedure: IRRIGATION AND DEBRIDEMENT LABIAL/VULVAR AREA;  Surgeon: Coralie Keens, MD;  Location: [place];  Service: General;  Laterality: N/A;  . INCISION AND DRAINAGE PERIRECTAL ABSCESS N/A 01/29/2018   Procedure: IRRIGATION AND DEBRIDEMENT VULVA;  Surgeon: Excell Seltzer, MD;  Location: [place];  Service: General;  Laterality: N/A;  . INGUINAL HERNIA REPAIR Left 04/08/2015   Procedure: OPEN LEFT INGUINAL HERNIA REPAIR WITH MESH;  Surgeon: Ralene Ok, MD;  Location: [place];  Service: General;  Laterality: Left;  . INSERTION OF MESH Left 04/08/2015   Procedure: INSERTION OF MESH;  Surgeon: Ralene Ok, MD;  Location: [place];  Service: General;  Laterality: Left;  . LAPAROSCOPY     Endometriosis  . LEFT HEART CATHETERIZATION WITH CORONARY  ANGIOGRAM N/A 12/05/2013   Procedure: LEFT HEART CATHETERIZATION WITH CORONARY ANGIOGRAM;  Surgeon: Jettie Booze, MD;  Location: [facility] CATH LAB;  Service: Cardiovascular;  Laterality: N/A;  . right kidney drained    . TEE WITHOUT CARDIOVERSION N/A 11/28/2013   Procedure: TRANSESOPHAGEAL ECHOCARDIOGRAM (TEE);  Surgeon: Thayer Headings, MD;  Location: [facility] ENDOSCOPY;  Service: Cardiovascular;  Laterality: N/A;     Social History   Socioeconomic History  . Marital status: Married    Spouse name: Not on file  . Number of children: 2  . Years of education: Not on file  . Highest education level: Not on file  Occupational History  . Not on file  Social Needs  . Financial resource strain: Not on file  . Food insecurity    Worry: Not on file    Inability: Not on file  . Transportation needs    Medical: Not on file    Non-medical: Not on file  Tobacco Use  . Smoking status: Current Some Day Smoker    Packs/day: 0.50    Years: 36.00    Pack years: 18.00    Types: Cigarettes    Last attempt to quit: 01/18/2018    Years since quitting: 1.0  . Smokeless tobacco: Never Used  Substance and Sexual Activity  . Alcohol use: No    Frequency: Never  . Drug use: No  . Sexual activity: Not Currently  Lifestyle  . Physical activity    Days per week: Patient refused    Minutes per session: Patient refused  . Stress: Only a little  Relationships  . Social connections    Talks on phone: More than three times a week    Gets together: Patient refused    Attends religious service: Patient refused    Active member of club or organization: Patient refused    Attends meetings of clubs or organizations: Patient refused    Relationship status: Married  . Intimate partner violence    Fear of current or ex partner: Patient refused    Emotionally abused: Patient refused    Physically abused: Patient refused    Forced sexual activity: Patient refused  Other Topics Concern  . Not on file  Social  History Narrative   ** Merged History Encounter **       Lives in [place]   Married but lives with Boyfriend - not legally separated   Disabled - for BiPolar, Seizure disorder, diabetes   Formerly worked at [employer]           Family History  Problem Relation Age of Onset  . Venous thrombosis Brother   . Other Brother        BRAIN TUMOR  . Asthma Father   . Diabetes Father   . Coronary artery disease Mother   . Hypertension Mother   .  Diabetes Mother   . Asthma Sister   . Diabetes type II Brother      Current Outpatient Medications on File Prior to Visit  Medication Sig Dispense Refill  . acetaminophen (TYLENOL) 500 MG tablet Take 500 mg by mouth every 4 (four) hours as needed.    Marland Kitchen albuterol (VENTOLIN HFA) 108 (90 Base) MCG/ACT inhaler INHALE 2 PUFFS BY MOUTH INTO THE LUNGS EVERY 4 HOURS AS NEEDED FOR SHORTNESS OFBREATH 6.7 g 0  . ALPRAZolam (XANAX) 0.5 MG tablet Take 1 tablet (0.5 mg total) by mouth 2 (two) times daily as needed for anxiety.  0  . aspirin EC 81 MG tablet Take 81 mg by mouth every morning.    . budesonide (PULMICORT) 0.5 MG/2ML nebulizer solution Take 0.5 mg by nebulization 2 (two) times daily.    . budesonide-formoterol (SYMBICORT) 80-4.5 MCG/ACT inhaler INHALE 2 PUFFS BY MOUTH EVERY 12 HOURS TO PREVENT COUGH OR WHEEZING *RINSE MOUTH AFTER USE* 10.2 g 2  . busPIRone (BUSPAR) 30 MG tablet Take 30 mg by mouth 2 (two) times daily.    . famotidine (PEPCID) 20 MG tablet Take 1 tablet (20 mg total) by mouth 2 (two) times daily. For heartburn. 180 tablet 0  . FLUoxetine (PROZAC) 40 MG capsule Take 40 mg by mouth daily.     Marland Kitchen glucose blood (ONETOUCH VERIO) test strip Use to monitor glucose levels three times daily; E11.9; E10.42 100 each 12  . Insulin Pen Needle 32G X 4 MM MISC Used to give insulin injections twice daily. 100 each 11  . insulin regular human CONCENTRATED (HUMULIN R U-500 KWIKPEN) 500 UNIT/ML kwikpen Inject 250 Units into the skin 2 (two) times a day.  14 pen 11  . ipratropium-albuterol (DUONEB) 0.5-2.5 (3) MG/3ML SOLN Take 3 mLs by nebulization every 4 (four) hours as needed (wheezing/shortness of breath). 360 mL 0  . loperamide (IMODIUM A-D) 2 MG tablet Take 4 mg by mouth 3 (three) times daily as needed for diarrhea or loose stools.    Glory Rosebush Delica Lancets 76O MISC 1 each by Does not apply route 3 (three) times daily. Use to monitor glucose levels three times daily; E11.9; E10.42 100 each 11  . OXYGEN Inhale 4 L into the lungs at bedtime.     . pregabalin (LYRICA) 300 MG capsule TAKE 1 CAPSULE BY MOUTH TWICE DAILY 180 capsule 0  . PRESCRIPTION MEDICATION Place 1 drop into both eyes daily as needed (every 14 weeks before injections).    Marland Kitchen rOPINIRole (REQUIP) 1 MG tablet TAKE 1 TABLET BY MOUTH EVERY NIGHT AT BEDTIME FOR RESTLESS LEGS. 90 tablet 1  . spironolactone (ALDACTONE) 50 MG tablet Take 1 tablet (50 mg total) by mouth daily. 90 tablet 0  . tobramycin (TOBREX) 0.3 % ophthalmic solution     . torsemide (DEMADEX) 20 MG tablet Take 40 mg by mouth 2 (two) times daily.    . traZODone (DESYREL) 100 MG tablet 100 mg at bedtime. 2 tabs bid    . ULTICARE MINI PEN NEEDLES 31G X 6 MM MISC USE AS DIRECTED THREE TIMES DAILY 100 each 0   No current facility-administered medications on file prior to visit.     Cardiovascular studies:  Echocardiogram 09/14/2018:  1. The left ventricle has normal systolic function, with an ejection fraction of 55-60%. The cavity size was normal. Left ventricular diastology could not be evaluated.  2. The right ventricle has normal systolic function. The cavity was normal. There is no increase in right  ventricular wall thickness.  3. Left atrial size was not well visualized.  4. The mitral valve is normal in structure. Mild thickening of the mitral valve leaflet.  5. The tricuspid valve is not well visualized. Tricuspid valve regurgitation was not assessed by color flow Doppler.  6. The aortic valve was not well  visualized Aortic valve regurgitation was not assessed by color flow Doppler.  7. The interatrial septum was not well visualized.  Carotid artery duplex 06/26/2018: Stenosis in the right internal carotid artery (50-69%). ICA/CCA ratio of 3.41 suggests upper end of spectrum stenosis of 69%. Mild heterogeneous plaque left. Right vertebral artery is not visualized. Antegrade left vertebral artery flow. Compared to the study done on 12/06/2017, right ICA stenosis was greater than 70%.  Right vertebral artery flows antegrade.  Not well visualized in the present study. Follow up in six months is appropriate if clinically indicated.  Lexiscan Cardilate stress test 03/30/2016:  Normal LVEF, 55-65%.  No evidence of ischemia.  Echocardiogram 11/16/2015:  Dynamic LVEF, 65-70% with pseudo-normal filling pattern.  Mild to moderately calcified aortic valve, trace aortic stenosis with peak gradient 23, mean gradient 11 mmHg.  Recent labs: Results for ABRIAL, ARRIGHI (MRN [id number]) as of 11/16/2018 22:33  Ref. Range 09/13/2018 10:14 09/13/2018 13:53 09/14/2018 03:17  COMPREHENSIVE METABOLIC PANEL Unknown Rpt (A)    Sodium Latest Ref Range: 135 - 145 mmol/L 134 (L) 136   Potassium Latest Ref Range: 3.5 - 5.1 mmol/L 3.6 3.6   Chloride Latest Ref Range: 98 - 111 mmol/L 99    CO2 Latest Ref Range: 22 - 32 mmol/L 25    Glucose Latest Ref Range: 70 - 99 mg/dL 159 (H)    Mean Plasma Glucose Latest Units: mg/dL   257.52  BUN Latest Ref Range: 6 - 20 mg/dL 23 (H)    Creatinine Latest Ref Range: 0.44 - 1.00 mg/dL 1.19 (H)    Calcium Latest Ref Range: 8.9 - 10.3 mg/dL 8.9    Anion gap Latest Ref Range: 5 - 15  10    Calcium Ionized Latest Ref Range: 1.15 - 1.40 mmol/L  1.18   Alkaline Phosphatase Latest Ref Range: 38 - 126 U/L 103    Albumin Latest Ref Range: 3.5 - 5.0 g/dL 3.4 (L)    AST Latest Ref Range: 15 - 41 U/L 21    ALT Latest Ref Range: 0 - 44 U/L 18    Total Protein Latest Ref Range: 6.5 - 8.1 g/dL 8.4 (H)     Total Bilirubin Latest Ref Range: 0.3 - 1.2 mg/dL 1.5 (H)    GFR, Est Non African American Latest Ref Range: >60 mL/min 54 (L)    GFR, Est African American Latest Ref Range: >60 mL/min >60    Total CHOL/HDL Ratio Latest Units: RATIO   7.0  Cholesterol Latest Ref Range: 0 - 200 mg/dL   146  HDL Cholesterol Latest Ref Range: >40 mg/dL   21 (L)  LDL (calc) Latest Ref Range: 0 - 99 mg/dL   98  Triglycerides Latest Ref Range: <150 mg/dL   135  VLDL Latest Ref Range: 0 - 40 mg/dL   27   Results for RESHONDA, KOERBER (MRN [id number]) as of 11/16/2018 22:33  Ref. Range 09/13/2018 10:14 09/13/2018 13:53  WBC Latest Ref Range: 4.0 - 10.5 K/uL 12.4 (H)   RBC Latest Ref Range: 3.87 - 5.11 MIL/uL 4.61   Hemoglobin Latest Ref Range: 12.0 - 15.0 g/dL 10.6 (L) 10.9 (L)  HCT Latest Ref  Range: 36.0 - 46.0 % 36.2 32.0 (L)  MCV Latest Ref Range: 80.0 - 100.0 fL 78.5 (L)   MCH Latest Ref Range: 26.0 - 34.0 pg 23.0 (L)   MCHC Latest Ref Range: 30.0 - 36.0 g/dL 29.3 (L)   RDW Latest Ref Range: 11.5 - 15.5 % 21.9 (H)   Platelets Latest Ref Range: 150 - 400 K/uL 200   nRBC Latest Ref Range: 0.0 - 0.2 % 0.0    Results for FARZANA, KOCI (MRN [id number]) as of 11/16/2018 22:33  Ref. Range 09/14/2018 03:17  Hemoglobin A1C Latest Ref Range: 4.8 - 5.6 % 10.6 (H)     Review of Systems  Constitution: Negative for decreased appetite, malaise/fatigue, weight gain and weight loss.  HENT: Negative for congestion.   Eyes: Negative for visual disturbance.  Cardiovascular: Positive for leg swelling. Negative for chest pain, dyspnea on exertion, palpitations and syncope.  Respiratory: Negative for shortness of breath.   Endocrine: Negative for cold intolerance.  Hematologic/Lymphatic: Does not bruise/bleed easily.  Skin: Negative for itching and rash.  Musculoskeletal: Negative for myalgias.  Gastrointestinal: Negative for abdominal pain, nausea and vomiting.  Genitourinary: Negative for dysuria.  Neurological: Negative for  dizziness and weakness.  Psychiatric/Behavioral: The patient is not nervous/anxious.   All other systems reviewed and are negative.       *** There were no vitals filed for this visit. (Measured by the patient using a home BP monitor)   Observation/findings during video visit   Objective:    Physical Exam  Constitutional: She is oriented to person, place, and time. She appears well-developed and well-nourished. No distress.  Pulmonary/Chest: Effort normal.  Musculoskeletal:        General: Edema (Tace b/l. Discoloration b/l LE) present.  Neurological: She is alert and oriented to person, place, and time.  Psychiatric: She has a normal mood and affect.  Nursing note and vitals reviewed.         Assessment & Recommendations:   48 y/o Caucasian female with hypertension, type 2 DM, morbid obesity,  HFpEF,  tobacco abuse.  HFpEF: Continue diuretics. Refilled metolazone. Continue spironolactone  Leg edema: Likely combination of HFpEF and venous insufficiency. Recommend compression stockings.   Hypertension: Controlled. Consider adding ACEi  Tobacco abuse: Reemphasized tobacco cessation.  Type 2 DM: Uncontrolled. Continue Aspirin. Consider statin.  I will see her bac in 3 months   Ilai Hiller Esther Hardy, MD [facility] Pager: [phone] Office: [phone]

## 2019-02-27 ENCOUNTER — Other Ambulatory Visit: Payer: Self-pay

## 2019-02-27 ENCOUNTER — Emergency Department (HOSPITAL_COMMUNITY): Payer: BC Managed Care – PPO

## 2019-02-27 ENCOUNTER — Emergency Department (HOSPITAL_COMMUNITY)
Admission: EM | Admit: 2019-02-27 | Discharge: 2019-02-27 | Disposition: A | Discharge status: Home / Self Care | Payer: BC Managed Care – PPO | Attending: Emergency Medicine | Admitting: Emergency Medicine

## 2019-02-27 ENCOUNTER — Telehealth: Payer: Self-pay

## 2019-02-27 DIAGNOSIS — R29810 Facial weakness: Secondary | ICD-10-CM | POA: Diagnosis not present

## 2019-02-27 DIAGNOSIS — Z79899 Other long term (current) drug therapy: Secondary | ICD-10-CM | POA: Insufficient documentation

## 2019-02-27 DIAGNOSIS — R4781 Slurred speech: Secondary | ICD-10-CM | POA: Diagnosis not present

## 2019-02-27 DIAGNOSIS — F17210 Nicotine dependence, cigarettes, uncomplicated: Secondary | ICD-10-CM | POA: Diagnosis not present

## 2019-02-27 DIAGNOSIS — E1151 Type 2 diabetes mellitus with diabetic peripheral angiopathy without gangrene: Secondary | ICD-10-CM | POA: Insufficient documentation

## 2019-02-27 DIAGNOSIS — R531 Weakness: Secondary | ICD-10-CM | POA: Diagnosis not present

## 2019-02-27 DIAGNOSIS — R29818 Other symptoms and signs involving the nervous system: Secondary | ICD-10-CM | POA: Diagnosis not present

## 2019-02-27 DIAGNOSIS — N182 Chronic kidney disease, stage 2 (mild): Secondary | ICD-10-CM | POA: Diagnosis not present

## 2019-02-27 DIAGNOSIS — I5032 Chronic diastolic (congestive) heart failure: Secondary | ICD-10-CM | POA: Insufficient documentation

## 2019-02-27 DIAGNOSIS — E1122 Type 2 diabetes mellitus with diabetic chronic kidney disease: Secondary | ICD-10-CM | POA: Insufficient documentation

## 2019-02-27 DIAGNOSIS — Z7982 Long term (current) use of aspirin: Secondary | ICD-10-CM | POA: Insufficient documentation

## 2019-02-27 DIAGNOSIS — E1140 Type 2 diabetes mellitus with diabetic neuropathy, unspecified: Secondary | ICD-10-CM | POA: Diagnosis not present

## 2019-02-27 DIAGNOSIS — Z794 Long term (current) use of insulin: Secondary | ICD-10-CM | POA: Insufficient documentation

## 2019-02-27 DIAGNOSIS — I4510 Unspecified right bundle-branch block: Secondary | ICD-10-CM | POA: Diagnosis not present

## 2019-02-27 DIAGNOSIS — I130 Hypertensive heart and chronic kidney disease with heart failure and stage 1 through stage 4 chronic kidney disease, or unspecified chronic kidney disease: Secondary | ICD-10-CM | POA: Insufficient documentation

## 2019-02-27 DIAGNOSIS — I6523 Occlusion and stenosis of bilateral carotid arteries: Secondary | ICD-10-CM | POA: Diagnosis not present

## 2019-02-27 LAB — DIFFERENTIAL
Abs Immature Granulocytes: 0.06 10*3/uL (ref 0.00–0.07)
Basophils Absolute: 0.1 10*3/uL (ref 0.0–0.1)
Basophils Relative: 1 %
Eosinophils Absolute: 0.2 10*3/uL (ref 0.0–0.5)
Eosinophils Relative: 3 %
Immature Granulocytes: 1 %
Lymphocytes Relative: 20 %
Lymphs Abs: 1.7 10*3/uL (ref 0.7–4.0)
Monocytes Absolute: 0.6 10*3/uL (ref 0.1–1.0)
Monocytes Relative: 6 %
Neutro Abs: 6.2 10*3/uL (ref 1.7–7.7)
Neutrophils Relative %: 69 %

## 2019-02-27 LAB — I-STAT CHEM 8, ED
BUN: 46 mg/dL — ABNORMAL HIGH (ref 6–20)
Calcium, Ion: 1.22 mmol/L (ref 1.15–1.40)
Chloride: 90 mmol/L — ABNORMAL LOW (ref 98–111)
Creatinine, Ser: 1.5 mg/dL — ABNORMAL HIGH (ref 0.44–1.00)
Glucose, Bld: 435 mg/dL — ABNORMAL HIGH (ref 70–99)
HCT: 49 % — ABNORMAL HIGH (ref 36.0–46.0)
Hemoglobin: 16.7 g/dL — ABNORMAL HIGH (ref 12.0–15.0)
Potassium: 4.1 mmol/L (ref 3.5–5.1)
Sodium: 132 mmol/L — ABNORMAL LOW (ref 135–145)
TCO2: 33 mmol/L — ABNORMAL HIGH (ref 22–32)

## 2019-02-27 LAB — COMPREHENSIVE METABOLIC PANEL
ALT: 40 U/L (ref 0–44)
AST: 48 U/L — ABNORMAL HIGH (ref 15–41)
Albumin: 3.6 g/dL (ref 3.5–5.0)
Alkaline Phosphatase: 120 U/L (ref 38–126)
Anion gap: 15 (ref 5–15)
BUN: 37 mg/dL — ABNORMAL HIGH (ref 6–20)
CO2: 28 mmol/L (ref 22–32)
Calcium: 10.7 mg/dL — ABNORMAL HIGH (ref 8.9–10.3)
Chloride: 87 mmol/L — ABNORMAL LOW (ref 98–111)
Creatinine, Ser: 1.53 mg/dL — ABNORMAL HIGH (ref 0.44–1.00)
GFR calc Af Amer: 45 mL/min — ABNORMAL LOW (ref >60)
GFR calc non Af Amer: 39 mL/min — ABNORMAL LOW (ref >60)
Glucose, Bld: 431 mg/dL — ABNORMAL HIGH (ref 70–99)
Potassium: 4.2 mmol/L (ref 3.5–5.1)
Sodium: 130 mmol/L — ABNORMAL LOW (ref 135–145)
Total Bilirubin: 1.3 mg/dL — ABNORMAL HIGH (ref 0.3–1.2)
Total Protein: 8.7 g/dL — ABNORMAL HIGH (ref 6.5–8.1)

## 2019-02-27 LAB — CBC
HCT: 46 % (ref 36.0–46.0)
Hemoglobin: 15.1 g/dL — ABNORMAL HIGH (ref 12.0–15.0)
MCH: 27.9 pg (ref 26.0–34.0)
MCHC: 32.8 g/dL (ref 30.0–36.0)
MCV: 84.9 fL (ref 80.0–100.0)
Platelets: 197 10*3/uL (ref 150–400)
RBC: 5.42 MIL/uL — ABNORMAL HIGH (ref 3.87–5.11)
RDW: 18.4 % — ABNORMAL HIGH (ref 11.5–15.5)
WBC: 8.8 10*3/uL (ref 4.0–10.5)
nRBC: 0 % (ref 0.0–0.2)

## 2019-02-27 LAB — CBG MONITORING, ED: Glucose-Capillary: 438 mg/dL — ABNORMAL HIGH (ref 70–99)

## 2019-02-27 LAB — PROTIME-INR
INR: 1 (ref 0.8–1.2)
Prothrombin Time: 13.5 seconds (ref 11.4–15.2)

## 2019-02-27 LAB — I-STAT BETA HCG BLOOD, ED (MC, WL, AP ONLY): I-stat hCG, quantitative: 7.6 m[IU]/mL — ABNORMAL HIGH (ref <5)

## 2019-02-27 LAB — APTT: aPTT: 24 seconds (ref 24–36)

## 2019-02-27 MED ORDER — IOHEXOL 350 MG/ML SOLN
75.0000 mL | Freq: Once | INTRAVENOUS | Status: AC | PRN
  Administered 2019-02-27: 75 mL via INTRAVENOUS

## 2019-02-27 MED ORDER — SODIUM CHLORIDE 0.9 % IV SOLN
INTRAVENOUS | Status: DC
  Administered 2019-02-27: 14:00:00 via INTRAVENOUS

## 2019-02-27 MED ORDER — METOCLOPRAMIDE HCL 5 MG/ML IJ SOLN
10.0000 mg | Freq: Once | INTRAMUSCULAR | Status: AC
  Administered 2019-02-27: 10 mg via INTRAVENOUS
  Filled 2019-02-27: qty 2

## 2019-02-27 MED ORDER — SODIUM CHLORIDE 0.9 % IV BOLUS
1000.0000 mL | Freq: Once | INTRAVENOUS | Status: AC
  Administered 2019-02-27: 1000 mL via INTRAVENOUS

## 2019-02-27 NOTE — ED Notes (Signed)
Pt is 87% on room air while sleeping. Pt comes up to 95% when awake. Pt placed on 2L via [place] by this RN. Pt 97% on 2L at present time. Pt reports she wears oxygen at home when she is sleeping.

## 2019-02-27 NOTE — Telephone Encounter (Signed)
Pt started with left sided numbness and weakness, slurred speech, facial drooping on lt side and H/A pain level 6 on 02/26/19. Pt did not want to go to ED due to fear of covid. I was on speaker phone and I advised Myriam Jacobson and pt that pt needs to go to ED now due to symptoms and needed testing. I explained to wear a mask and the EDs are taking every precaution they can to prevent spread of covid virus. I advised pt I was concerned pt could be having a stroke and needed to go to ED now. I asked if pt had a way to get to [place] said she would go to [facility] ED and pt will call ambulance so will not have to wait in waiting room. Myriam Jacobson said she would help pt call 911. pts speech was slow and slurred. FYI to Gentry Fitz NP.

## 2019-02-27 NOTE — ED Notes (Signed)
Patient transported to CT 2

## 2019-02-27 NOTE — Consult Note (Addendum)
Neurology Consultation  Reason for Consult: Code stroke Referring Physician: Maryan Rued  CC: Slurred speech and left-sided weakness/decreased sensation  History is obtained from: Patient  HPI: [name] is a 48 y.o. female with multiple medical problems.  Those pertaining to stroke include tobacco use, TIA, hyperlipidemia, hypertension, diabetes, CAD.  Patient states that she was outside mowing her yard yesterday when at 530 she noticed that she felt that she was weak on the left side along with decrease sensation.  When she got into the house she noticed that she had a left facial droop.  She states she did not come to the hospital yesterday secondary to significant fear of having a COVID swab.  Patient called her PCP office who advised her to go to the emergency department to evaluate for stroke.  On arrival patient still complained of slurred speech, left-sided weakness, along with decreased left-sided sensation.  Patient does admit that she has missed 2 days of her aspirin.  She states she has been taking all other drugs.  Patient denies taking any extra medications that are on her med list.    ED course patient was immediately brought to CT scan, CTA of head and neck were obtained, i-STAT was obtained,   Chart review   -patient was recently seen in the hospital on 09/13/2018.  Main complaints at that time was altered mental status and left-sided weakness.  At that time was brought in due to husband noticing the blank stare and shaking of the whole body.  At that time it was stated that her symptoms resolved after 10 minutes.  While in the ED she had a left-sided headache.  CT head was negative and MRI was not possible secondary to metallic object in her her scalp.  EEG was obtained which showed no epileptiform activity.  Repeat CT within 24 hours was normal.  Patient was discharged the next day.  -Patient was also seen in the hospital multiple x1 year ago.  At one point on 02/28/2017 she  came alone complaining of bilateral blindness.  On exam she did blink to threat and track examiner in the room.  CT head was negative.  CTA of neck was 60% stenosis on the right carotid artery and CTA of head showed no large vessel occlusion.  LKW: 02/26/2019 at approximately 76 30 tpa given?: no, out of window Premorbid modified Rankin scale (mRS): 0 NIH stroke scale 8    ROS: A 14 point ROS was performed and is negative except as noted in the HPI.   Past Medical History:  Diagnosis Date  . Anxiety    takes Prozac daily  . Anxiety   . Aortic valve calcification   . Asthma    Advair and Spirva daily  . Asthma   . Bipolar disorder ([place])   . CAD (coronary artery disease)    a. LHC 11/2013 done for CP/fluid retention: mild disease in prox LAD, mild-mod disease in mRCA, EF 60% with normal LVEDP. b. Normal nuc 03/2016.  Marland Kitchen CHF (congestive heart failure) ([place])   . Chronic diastolic CHF (congestive heart failure) ([place])   . CKD (chronic kidney disease), stage II   . COPD (chronic obstructive pulmonary disease) ([facility])    a. nocturnal O2.  Marland Kitchen COPD (chronic obstructive pulmonary disease) ([place])   . Coronary artery disease   . Decreased urine stream   . Diabetes mellitus   . Diabetes mellitus without complication ([place])   . Dyspnea   . Family history of adverse reaction  to anesthesia    mom gets nauseated  . GERD (gastroesophageal reflux disease)    takes Pepcid daily  . History of blood clots    left leg 3-35yrs ago  . Hyperlipidemia   . Hypertension   . Hypertriglyceridemia   . Inguinal hernia, left 01/2015  . Muscle spasm   . Open wound of genital labia   . Peripheral neuropathy   . RBBB   . Seizures ([place])   . Sepsis ([place]) 01/19/2018  . Sinus tachycardia    a. persistent since 2009.  Marland Kitchen Smokers' cough ([place])   . Stroke [facility]) 1989   left sided weakness  . TIA (transient ischemic attack)   . Tobacco abuse   . Vulvar abscess 01/23/2018    Family History  Problem Relation Age of  Onset  . Venous thrombosis Brother   . Other Brother        BRAIN TUMOR  . Asthma Father   . Diabetes Father   . Coronary artery disease Mother   . Hypertension Mother   . Diabetes Mother   . Asthma Sister   . Diabetes type II Brother     Social History:   reports that she has been smoking cigarettes. She has a 18.00 pack-year smoking history. She has never used smokeless tobacco. She reports that she does not drink alcohol or use drugs.  Medications  Current Facility-Administered Medications:  .  0.9 %  sodium chloride infusion, , Intravenous, Continuous, Plunkett, Whitney, MD .  metoCLOPramide (REGLAN) injection 10 mg, 10 mg, Intravenous, Once, Blanchie Dessert, MD  Current Outpatient Medications:  .  acetaminophen (TYLENOL) 500 MG tablet, Take 500 mg by mouth every 4 (four) hours as needed., Disp: , Rfl:  .  albuterol (VENTOLIN HFA) 108 (90 Base) MCG/ACT inhaler, INHALE 2 PUFFS BY MOUTH INTO THE LUNGS EVERY 4 HOURS AS NEEDED FOR SHORTNESS OFBREATH, Disp: 6.7 g, Rfl: 0 .  ALPRAZolam (XANAX) 0.5 MG tablet, Take 1 tablet (0.5 mg total) by mouth 2 (two) times daily as needed for anxiety., Disp: , Rfl: 0 .  aspirin EC 81 MG tablet, Take 81 mg by mouth every morning., Disp: , Rfl:  .  budesonide (PULMICORT) 0.5 MG/2ML nebulizer solution, Take 0.5 mg by nebulization 2 (two) times daily., Disp: , Rfl:  .  budesonide-formoterol (SYMBICORT) 80-4.5 MCG/ACT inhaler, INHALE 2 PUFFS BY MOUTH EVERY 12 HOURS TO PREVENT COUGH OR WHEEZING *RINSE MOUTH AFTER USE*, Disp: 10.2 g, Rfl: 2 .  busPIRone (BUSPAR) 30 MG tablet, Take 30 mg by mouth 2 (two) times daily., Disp: , Rfl:  .  famotidine (PEPCID) 20 MG tablet, Take 1 tablet (20 mg total) by mouth 2 (two) times daily. For heartburn., Disp: 180 tablet, Rfl: 0 .  FLUoxetine (PROZAC) 40 MG capsule, Take 40 mg by mouth daily. , Disp: , Rfl:  .  glucose blood (ONETOUCH VERIO) test strip, Use to monitor glucose levels three times daily; E11.9; E10.42,  Disp: 100 each, Rfl: 12 .  Insulin Pen Needle 32G X 4 MM MISC, Used to give insulin injections twice daily., Disp: 100 each, Rfl: 11 .  insulin regular human CONCENTRATED (HUMULIN R U-500 KWIKPEN) 500 UNIT/ML kwikpen, Inject 250 Units into the skin 2 (two) times a day., Disp: 14 pen, Rfl: 11 .  ipratropium-albuterol (DUONEB) 0.5-2.5 (3) MG/3ML SOLN, Take 3 mLs by nebulization every 4 (four) hours as needed (wheezing/shortness of breath)., Disp: 360 mL, Rfl: 0 .  loperamide (IMODIUM A-D) 2 MG tablet, Take 4 mg  by mouth 3 (three) times daily as needed for diarrhea or loose stools., Disp: , Rfl:  .  OneTouch Delica Lancets 99B MISC, 1 each by Does not apply route 3 (three) times daily. Use to monitor glucose levels three times daily; E11.9; E10.42, Disp: 100 each, Rfl: 11 .  OXYGEN, Inhale 4 L into the lungs at bedtime. , Disp: , Rfl:  .  pregabalin (LYRICA) 300 MG capsule, TAKE 1 CAPSULE BY MOUTH TWICE DAILY, Disp: 180 capsule, Rfl: 0 .  PRESCRIPTION MEDICATION, Place 1 drop into both eyes daily as needed (every 14 weeks before injections)., Disp: , Rfl:  .  rOPINIRole (REQUIP) 1 MG tablet, TAKE 1 TABLET BY MOUTH EVERY NIGHT AT BEDTIME FOR RESTLESS LEGS., Disp: 90 tablet, Rfl: 1 .  spironolactone (ALDACTONE) 50 MG tablet, Take 1 tablet (50 mg total) by mouth daily., Disp: 90 tablet, Rfl: 0 .  tobramycin (TOBREX) 0.3 % ophthalmic solution, , Disp: , Rfl:  .  torsemide (DEMADEX) 20 MG tablet, Take 40 mg by mouth 2 (two) times daily., Disp: , Rfl:  .  traZODone (DESYREL) 100 MG tablet, 100 mg at bedtime. 2 tabs bid, Disp: , Rfl:  .  ULTICARE MINI PEN NEEDLES 31G X 6 MM MISC, USE AS DIRECTED THREE TIMES DAILY, Disp: 100 each, Rfl: 0   Exam: Current vital signs: BP 128/67   Pulse 86   Temp 98.1 F (36.7 C) (Oral)   Resp 18   Ht 5\' 4"  (1.626 m)   Wt 127 kg   LMP 11/11/2011   SpO2 90%   BMI 48.06 kg/m  Vital signs in last 24 hours: Temp:  [98.1 F (36.7 C)] 98.1 F (36.7 C) (08/19  1245) Pulse Rate:  [86-89] 86 (08/19 1300) Resp:  [16-18] 18 (08/19 1300) BP: (115-128)/(66-67) 128/67 (08/19 1300) SpO2:  [90 %-93 %] 90 % (08/19 1300) Weight:  [716 kg] 127 kg (08/19 1246)  Physical Exam  Constitutional: Appears well-developed and well-nourished.  Psych: Affect appropriate to situation Eyes: No scleral injection HENT: No OP obstrucion Head: Normocephalic.  Cardiovascular: Normal rate and regular rhythm.  Respiratory: Effort normal, non-labored breathing GI: Soft.  No distension. There is no tenderness.  Skin: WDI  Neuro: Mental Status: Patient is awake, alert, oriented to person, place, month, year, and situation. Patient is able to give a clear and coherent history. Mild dysarthria Cranial Nerves: II: Visual Fields are full--however at times she had some difficulty seeing out of the left peripheral field. III,IV, VI: EOMI without ptosis or diploplia. Pupils equal, round and reactive to light V: Facial sensation decreased on the left VII: Left facial droop.  However no ear leak when puffing her cheeks out.  When I asked patient to take off her mask it was very noticeable that her face was symmetrical and then she quickly drew the right side of her face upward.  It is also noticeable that when she was distracted and talking about her land there was no facial droop. VIII: hearing is intact to voice X: Palat elevates symmetrically XI: Shoulder shrug is symmetric. XII: tongue is midline without atrophy or fasciculations.  Motor: Patient has 5/5 strength throughout her right side.  Bilateral legs have 5/5.  Patient has decreased effort on her left arm with 4/5.  Patient has no drift in the upper extremities and no drift in her lower extremities. Sensory: Sensation decreased on the left arm and states she cannot feel either leg however she did not have any neglect to  DSS.  Patient splits midline to sensation at both the upper chest and also the abdomen Deep Tendon  Reflexes: Patient has no ankle jerk, no knee jerk, 1+ brachioradialis and bicep bilaterally Plantars: Toes are mute Cerebellar: FNF showed no dysmetria   Labs I have reviewed labs in epic and the results pertinent to this consultation are:   CBC    Component Value Date/Time   WBC 8.8 02/27/2019 1302   RBC 5.42 (H) 02/27/2019 1302   HGB 16.7 (H) 02/27/2019 1308   HCT 49.0 (H) 02/27/2019 1308   PLT 197 02/27/2019 1302   MCV 84.9 02/27/2019 1302   MCH 27.9 02/27/2019 1302   MCHC 32.8 02/27/2019 1302   RDW 18.4 (H) 02/27/2019 1302   LYMPHSABS 1.7 02/27/2019 1302   MONOABS 0.6 02/27/2019 1302   EOSABS 0.2 02/27/2019 1302   BASOSABS 0.1 02/27/2019 1302    CMP     Component Value Date/Time   NA 132 (L) 02/27/2019 1308   NA 137 01/08/2018 1117   K 4.1 02/27/2019 1308   CL 90 (L) 02/27/2019 1308   CO2 25 09/13/2018 1014   GLUCOSE 435 (H) 02/27/2019 1308   BUN 46 (H) 02/27/2019 1308   BUN 31 (H) 01/08/2018 1117   CREATININE 1.50 (H) 02/27/2019 1308   CREATININE 1.20 (H) 05/24/2016 0938   CALCIUM 8.9 09/13/2018 1014   PROT 8.4 (H) 09/13/2018 1014   PROT 7.8 01/08/2018 1117   ALBUMIN 3.4 (L) 09/13/2018 1014   ALBUMIN 4.1 01/08/2018 1117   AST 21 09/13/2018 1014   ALT 18 09/13/2018 1014   ALKPHOS 103 09/13/2018 1014   BILITOT 1.5 (H) 09/13/2018 1014   BILITOT 0.5 01/08/2018 1117   GFRNONAA 54 (L) 09/13/2018 1014   GFRAA >60 09/13/2018 1014    Lipid Panel     Component Value Date/Time   CHOL 146 09/14/2018 0317   TRIG 135 09/14/2018 0317   HDL 21 (L) 09/14/2018 0317   CHOLHDL 7.0 09/14/2018 0317   VLDL 27 09/14/2018 0317   LDLCALC 98 09/14/2018 0317   LDLDIRECT 145 (H) 04/30/2009 2013     Imaging I have reviewed the images obtained:  CT-scan of the brain- no acute intracranial hemorrhage or demarcated territorial infarction.  MRI examination of the brain-unable to obtain secondary to metallic BB in scalp  CTA of head and neck-pending  Etta Quill  PA-C [facility] [phone]  M-F  (9:00 am- 5:00 PM)  02/27/2019, 1:40 PM     Assessment:  This is a 48 year old female presenting to the hospital with left-sided weakness, dysarthria, left facial droop.  Exam is very inconsistent with patient drawing up the right side of her mouth, no pronator drift on the left side rather a sustained downward drift of the left arm and reduced effort on testing the left leg.  CT head showed no acute stroke.  She is outside the window for any acute intervention, outside TPA window.  Clinically not LVO.   Recommendations:  -At this time would recommend patient remain in observation overnight and repeat CT of head in the morning. -Urine drug screen and urine tox psychology screen  Of note patient states that she does not want to stay in the hospital.  I have recommended that she at least stay overnight unless her symptoms improve.  NEUROHOSPITALIST ADDENDUM Performed a face to face diagnostic evaluation at the time of code stroke.  I have reviewed the contents of history and physical exam as documented by PA/ARNP/Resident and agree  with above documentation.  I have discussed and formulated the above plan as documented. Edits to the note have been made as needed.  48 year old female with with past medical history of anxiety on Prozac as well as benzodiazepine, bipolar disorder as well as multiple stroke risk factors including diabetes, hypertension, hyperlipidemia, morbid obesity, congestive heart failure and old stroke with left-sided weakness documented in the chart with no residual left-sided weakness.  She presents this time again with left-sided weakness that she noted yesterday around 5 PM however refused to come to the hospital as she did not want to get swab for COVID 19.  On examination, patient would draw up the right side of her face.  Exam was not consistent, there was no pronator drift.  She appeared to have reduced effort when  testing the left lower extremity strength.  Low suspicion that she is having an acute stroke, however cannot obtain MRI brain due as patient has metallic BB in left scalp.    High suspicion for nonorganic etiology for symptoms and given her presenting similar presentations in the past for conversion disorder. Recommend repeating CT head in 24 hours to rule out stroke if patient does not return to baseline.      Karena Addison Karon Cotterill MD [facility] [phone]   If 7pm to 7am, please call on call as listed on AMION.

## 2019-02-27 NOTE — ED Provider Notes (Signed)
Patient handed off to me at 3 PM.  Patient was a code stroke upon arrival.  Work-up is overall unremarkable.  Patient had some left-sided facial droop with decrease sensation.  No obvious visual changes.  Has some decreased sensation to the left upper and left lower arm.  She had some decreased strength on the side.  Imaging was unremarkable.  Neurology has evaluated the patient and feels like she does not meet stroke criteria.  Patient felt better after headache cocktail.  Is unable to get an MRI due to metal in her brain.  Thought to be possibly a functional disorder.  Patient was able to ambulate without any issues.  Wants to go home and believe that this is appropriate.  Recommend follow-up with primary care doctor and neurology outpatient.  This chart was dictated using voice recognition software.  Despite best efforts to proofread,  errors can occur which can change the documentation meaning.     Lennice Sites, DO 02/27/19 1850

## 2019-02-27 NOTE — Telephone Encounter (Signed)
Noted. Patient currently in the ED at [facility] and under evaluation/treatment.

## 2019-02-27 NOTE — ED Notes (Signed)
Called [facility] to activate code stroke  

## 2019-02-27 NOTE — ED Notes (Signed)
Pt ambulates in the hall with this RN. Pt denies any shortness of breath, chest pain, or dizziness when ambulating. Pt ambulates with steady gait with the assist of this RN.

## 2019-02-27 NOTE — ED Provider Notes (Signed)
[facility]ERGENCY DEPARTMENT Provider Note   CSN: [id number] Arrival date & time: 02/27/19  1244     History   Chief Complaint Chief Complaint  Patient presents with  . Aphasia    HPI [name] is a 48 y.o. female.     Patient is a 48 year old female with a history of poorly controlled diabetes, hypertension, hyperlipidemia, coronary artery disease, chronic kidney disease, COPD, CHF, ongoing tobacco abuse and prior stroke with left-sided weakness who is presenting today with weakness and numbness of the left side of the face, upper and lower extremity that started around 5 PM yesterday.  Patient states she was out mowing her yard and did recall getting overheated but when she came in she started noticing the numbness to the left side of her face arm and leg.  She is also having weakness in these areas.  2 hours after the symptoms started she started getting a headache which is persisted.  She states she has struggled with intermittent headaches and this feels similar.  She denies any recent trauma, change in medications.  She has had issues with her left eye and states her vision is blurry today but she does not know that it is any different than baseline.  She denies cough, fever, congestion, abdominal pain, nausea or vomiting.  The history is provided by the patient.    Past Medical History:  Diagnosis Date  . Anxiety    takes Prozac daily  . Anxiety   . Aortic valve calcification   . Asthma    Advair and Spirva daily  . Asthma   . Bipolar disorder ([place])   . CAD (coronary artery disease)    a. LHC 11/2013 done for CP/fluid retention: mild disease in prox LAD, mild-mod disease in mRCA, EF 60% with normal LVEDP. b. Normal nuc 03/2016.  Marland Kitchen CHF (congestive heart failure) ([place])   . Chronic diastolic CHF (congestive heart failure) ([place])   . CKD (chronic kidney disease), stage II   . COPD (chronic obstructive pulmonary disease) (HCC)    a. nocturnal O2.  Marland Kitchen  COPD (chronic obstructive pulmonary disease) ([place])   . Coronary artery disease   . Decreased urine stream   . Diabetes mellitus   . Diabetes mellitus without complication ([place])   . Dyspnea   . Family history of adverse reaction to anesthesia    mom gets nauseated  . GERD (gastroesophageal reflux disease)    takes Pepcid daily  . History of blood clots    left leg 3-44yrs ago  . Hyperlipidemia   . Hypertension   . Hypertriglyceridemia   . Inguinal hernia, left 01/2015  . Muscle spasm   . Open wound of genital labia   . Peripheral neuropathy   . RBBB   . Seizures ([place])   . Sepsis ([place]) 01/19/2018  . Sinus tachycardia    a. persistent since 2009.  Marland Kitchen Smokers' cough ([place])   . Stroke [facility]) 1989   left sided weakness  . TIA (transient ischemic attack)   . Tobacco abuse   . Vulvar abscess 01/23/2018    Patient Active Problem List   Diagnosis Date Noted  . Chronic heart failure with preserved ejection fraction ([place]) 11/16/2018  . Left-sided weakness 09/13/2018  . Weakness of left lower extremity 09/05/2018  . Recurrent falls 09/05/2018  . PAD (peripheral artery disease) ([place]) 08/27/2018  . Chronic congestive heart failure ([place]) 08/02/2018  . Vaginal itching 06/15/2018  . Foot pain, bilateral 05/22/2018  .  Chronic upper extremity pain (Secondary Area of Pain) (Bilateral) 04/23/2018  . Diabetic polyneuropathy associated with type 1 diabetes mellitus ([place]) 04/23/2018  . Long term prescription benzodiazepine use 04/23/2018  . Neurogenic pain 04/23/2018  . Vitamin D insufficiency 01/29/2018  . Elevated C-reactive protein (CRP) 01/29/2018  . Elevated sedimentation rate 01/29/2018  . Pressure injury of skin 01/29/2018  . Poorly controlled type 2 diabetes mellitus ([place]) 01/23/2018  . DNR (do not resuscitate) 01/23/2018  . IBS (irritable bowel syndrome) 01/19/2018  . Elevated serum hCG 01/19/2018  . Chronic lower extremity pain (Primary Area of Pain) (Bilateral) 01/08/2018  .  Chronic low back pain [facility] Area of Pain) (Bilateral) w/ sciatica (Bilateral) 01/08/2018  . Chronic pain syndrome 01/08/2018  . Pharmacologic therapy 01/08/2018  . Disorder of skeletal system 01/08/2018  . Problems influencing health status 01/08/2018  . Long term current use of opiate analgesic 01/08/2018  . Restless leg syndrome 08/02/2017  . Nocturnal hypoxemia 03/29/2017  . Vision disturbance 02/28/2017  . CKD (chronic kidney disease), stage II 02/28/2017  . Visual loss, bilateral   . Chronic respiratory failure with hypoxia (HCC)/ nocturnal 02 dep  10/28/2016  . Upper airway cough syndrome 08/22/2016  . Cigarette smoker 06/15/2016  . Simple chronic bronchitis ([place])   . Coronary artery disease involving native heart without angina pectoris 05/16/2016  . Acute on chronic diastolic (congestive) heart failure ([place]) 11/04/2015  . Orthopnea 11/03/2015  . Bilateral leg edema   . Diabetes ([place])   . COPD exacerbation ([place]) 10/15/2015  . Fracture of rib, closed 10/15/2015  . Venous stasis of both lower extremities 03/29/2015  . Preventative health care 02/04/2015  . Essential hypertension 01/02/2015  . Inguinal hernia 11/27/2014  . Allergic rhinitis with postnasal drip 01/21/2014  . Aortic valve disorders 04/18/2013  . Sleep apnea 05/22/2012  . Seizure disorder ([place]) 10/09/2011  . Bipolar disorder ([place]) 10/09/2011  . TIA (transient ischemic attack) 06/22/2011  . Constipation 06/15/2011  . HYPERTRIGLYCERIDEMIA 07/17/2007  . COPD GOLD 0  05/30/2007  . Morbid (severe) obesity due to excess calories ([place]) 04/23/2007  . Tobacco abuse 09/07/2006  . Asthma, persistent 09/07/2006    Past Surgical History:  Procedure Laterality Date  . HERNIA REPAIR    . INCISION AND DRAINAGE ABSCESS N/A 01/26/2018   Procedure: INCISION AND DEBRIDEMENT OF VULVAR NECROTIZING SOFT TISSUE INFECTION;  Surgeon: Greer Pickerel, MD;  Location: [place];  Service: General;  Laterality: N/A;  . INCISION AND  DRAINAGE PERIRECTAL ABSCESS N/A 01/22/2018   Procedure: IRRIGATION AND DEBRIDEMENT LABIAL/VULVAR AREA;  Surgeon: Coralie Keens, MD;  Location: [facility];  Service: General;  Laterality: N/A;  . INCISION AND DRAINAGE PERIRECTAL ABSCESS N/A 01/29/2018   Procedure: IRRIGATION AND DEBRIDEMENT VULVA;  Surgeon: Excell Seltzer, MD;  Location: [name];  Service: General;  Laterality: N/A;  . INGUINAL HERNIA REPAIR Left 04/08/2015   Procedure: OPEN LEFT INGUINAL HERNIA REPAIR WITH MESH;  Surgeon: Ralene Ok, MD;  Location: [place];  Service: General;  Laterality: Left;  . INSERTION OF MESH Left 04/08/2015   Procedure: INSERTION OF MESH;  Surgeon: Ralene Ok, MD;  Location: [place];  Service: General;  Laterality: Left;  . LAPAROSCOPY     Endometriosis  . LEFT HEART CATHETERIZATION WITH CORONARY ANGIOGRAM N/A 12/05/2013   Procedure: LEFT HEART CATHETERIZATION WITH CORONARY ANGIOGRAM;  Surgeon: Jettie Booze, MD;  Location[facility] CATH LAB;  Service: Cardiovascular;  Laterality: N/A;  . right kidney drained    . TEE WITHOUT CARDIOVERSION N/A 11/28/2013  Procedure: TRANSESOPHAGEAL ECHOCARDIOGRAM (TEE);  Surgeon: Thayer Headings, MD;  Location[facility] ENDOSCOPY;  Service: Cardiovascular;  Laterality: N/A;     OB History   No obstetric history on file.      Home Medications    Prior to Admission medications   Medication Sig Start Date End Date Taking? Authorizing Provider  acetaminophen (TYLENOL) 500 MG tablet Take 500 mg by mouth every 4 (four) hours as needed.    [provider]  albuterol (VENTOLIN HFA) 108 (90 Base) MCG/ACT inhaler INHALE 2 PUFFS BY MOUTH INTO THE LUNGS EVERY 4 HOURS AS NEEDED FOR SHORTNESS OFBREATH 01/22/19   Pleas Koch, NP  ALPRAZolam Duanne Moron) 0.5 MG tablet Take 1 tablet (0.5 mg total) by mouth 2 (two) times daily as needed for anxiety. 09/14/18   Geradine Girt, DO  aspirin EC 81 MG tablet Take 81 mg by mouth every morning.    [provider]   budesonide (PULMICORT) 0.5 MG/2ML nebulizer solution Take 0.5 mg by nebulization 2 (two) times daily.    [provider]  budesonide-formoterol (SYMBICORT) 80-4.5 MCG/ACT inhaler INHALE 2 PUFFS BY MOUTH EVERY 12 HOURS TO PREVENT COUGH OR WHEEZING *RINSE MOUTH AFTER USE* 11/30/18   Pleas Koch, NP  busPIRone (BUSPAR) 30 MG tablet Take 30 mg by mouth 2 (two) times daily.    [provider]  famotidine (PEPCID) 20 MG tablet Take 1 tablet (20 mg total) by mouth 2 (two) times daily. For heartburn. 12/07/18   Pleas Koch, NP  FLUoxetine (PROZAC) 40 MG capsule Take 40 mg by mouth daily.  07/25/18   [provider]  glucose blood (ONETOUCH VERIO) test strip Use to monitor glucose levels three times daily; E11.9; E10.42 09/26/18   Renato Shin, MD  Insulin Pen Needle 32G X 4 MM MISC Used to give insulin injections twice daily. 08/23/17   Renato Shin, MD  insulin regular human CONCENTRATED (HUMULIN R U-500 KWIKPEN) 500 UNIT/ML kwikpen Inject 250 Units into the skin 2 (two) times a day. 12/17/18   Pleas Koch, NP  ipratropium-albuterol (DUONEB) 0.5-2.5 (3) MG/3ML SOLN Take 3 mLs by nebulization every 4 (four) hours as needed (wheezing/shortness of breath). 08/02/17   Pleas Koch, NP  loperamide (IMODIUM A-D) 2 MG tablet Take 4 mg by mouth 3 (three) times daily as needed for diarrhea or loose stools.    [provider]  OneTouch Delica Lancets 24Q MISC 1 each by Does not apply route 3 (three) times daily. Use to monitor glucose levels three times daily; E11.9; E10.42 09/26/18   Renato Shin, MD  OXYGEN Inhale 4 L into the lungs at bedtime.     [provider]  pregabalin (LYRICA) 300 MG capsule TAKE 1 CAPSULE BY MOUTH TWICE DAILY 01/30/19   Pleas Koch, NP  PRESCRIPTION MEDICATION Place 1 drop into both eyes daily as needed (every 14 weeks before injections).    [provider]  rOPINIRole (REQUIP) 1 MG tablet TAKE 1 TABLET BY  MOUTH EVERY NIGHT AT BEDTIME FOR RESTLESS LEGS. 01/01/19   Pleas Koch, NP  spironolactone (ALDACTONE) 50 MG tablet Take 1 tablet (50 mg total) by mouth daily. 12/24/18   Patwardhan, Reynold Bowen, MD  tobramycin (TOBREX) 0.3 % ophthalmic solution  10/26/18   [provider]  torsemide (DEMADEX) 20 MG tablet Take 40 mg by mouth 2 (two) times daily.    [provider]  traZODone (DESYREL) 100 MG tablet 100 mg at bedtime. 2  tabs bid 11/08/18   [provider]  ULTICARE MINI PEN NEEDLES 31G X 6 MM MISC USE AS DIRECTED THREE TIMES DAILY 07/25/18   Renato Shin, MD    Family History Family History  Problem Relation Age of Onset  . Venous thrombosis Brother   . Other Brother        BRAIN TUMOR  . Asthma Father   . Diabetes Father   . Coronary artery disease Mother   . Hypertension Mother   . Diabetes Mother   . Asthma Sister   . Diabetes type II Brother     Social History Social History   Tobacco Use  . Smoking status: Current Some Day Smoker    Packs/day: 0.50    Years: 36.00    Pack years: 18.00    Types: Cigarettes    Last attempt to quit: 01/18/2018    Years since quitting: 1.1  . Smokeless tobacco: Never Used  Substance Use Topics  . Alcohol use: No    Frequency: Never  . Drug use: No     Allergies   Adhesive [tape], Metoprolol, Montelukast, Morphine sulfate, Penicillins, Prednisone, Diltiazem, and Gabapentin   Review of Systems Review of Systems  All other systems reviewed and are negative.    Physical Exam Updated Vital Signs BP 115/66   Pulse 89   Temp 98.1 F (36.7 C) (Oral)   Resp 16   Ht 5\' 4"  (1.626 m)   Wt 127 kg   LMP 11/11/2011   SpO2 93%   BMI 48.06 kg/m   Physical Exam Vitals signs and nursing note reviewed.  Constitutional:      General: She is not in acute distress.    Appearance: She is well-developed. She is obese.  HENT:     Head: Normocephalic and atraumatic.     Right Ear: Tympanic membrane normal.      Left Ear: Tympanic membrane normal.     Mouth/Throat:     Mouth: Mucous membranes are moist.  Eyes:     Pupils: Pupils are equal, round, and reactive to light.  Cardiovascular:     Rate and Rhythm: Normal rate and regular rhythm.     Heart sounds: Normal heart sounds. No murmur. No friction rub.  Pulmonary:     Effort: Pulmonary effort is normal.     Breath sounds: Normal breath sounds. No wheezing or rales.  Abdominal:     General: Bowel sounds are normal. There is no distension.     Palpations: Abdomen is soft.     Tenderness: There is no abdominal tenderness. There is no guarding or rebound.  Musculoskeletal: Normal range of motion.        General: No tenderness.     Comments: No edema  Skin:    General: Skin is warm and dry.     Findings: No rash.  Neurological:     Mental Status: She is alert and oriented to person, place, and time.     Cranial Nerves: Cranial nerve deficit and facial asymmetry present. No dysarthria.     Sensory: Sensory deficit present.     Motor: Weakness present.     Coordination: Coordination is intact.     Comments: Left-sided facial droop with decreased sensation.  Difficult to assess visual field as patient states her vision is blurry but unable to discern if there is a specific visual field cut.  Decreased sensation in the left upper and lower extremity with mild left upper arm pronator drift and  4 out of 5 strength in the upper and lower extremity compared to the right  Psychiatric:        Mood and Affect: Mood normal.        Behavior: Behavior normal.        Thought Content: Thought content normal.      ED Treatments / Results  Labs (all labs ordered are listed, but only abnormal results are displayed) Labs Reviewed  CBC - Abnormal; Notable for the following components:      Result Value   RBC 5.42 (*)    Hemoglobin 15.1 (*)    RDW 18.4 (*)    All other components within normal limits  COMPREHENSIVE METABOLIC PANEL - Abnormal; Notable for  the following components:   Sodium 130 (*)    Chloride 87 (*)    Glucose, Bld 431 (*)    BUN 37 (*)    Creatinine, Ser 1.53 (*)    Calcium 10.7 (*)    Total Protein 8.7 (*)    AST 48 (*)    Total Bilirubin 1.3 (*)    GFR calc non Af Amer 39 (*)    GFR calc Af Amer 45 (*)    All other components within normal limits  I-STAT CHEM 8, ED - Abnormal; Notable for the following components:   Sodium 132 (*)    Chloride 90 (*)    BUN 46 (*)    Creatinine, Ser 1.50 (*)    Glucose, Bld 435 (*)    TCO2 33 (*)    Hemoglobin 16.7 (*)    HCT 49.0 (*)    All other components within normal limits  CBG MONITORING, ED - Abnormal; Notable for the following components:   Glucose-Capillary 438 (*)    All other components within normal limits  I-STAT BETA HCG BLOOD, ED ([facility], [facility], [facility] ONLY) - Abnormal; Notable for the following components:   I-stat hCG, quantitative 7.6 (*)    All other components within normal limits  DIFFERENTIAL  PROTIME-INR  APTT    EKG EKG Interpretation  Date/Time:  Wednesday February 27 2019 13:05:30 EDT Ventricular Rate:  87 PR Interval:    QRS Duration: 128 QT Interval:  388 QTC Calculation: 467 R Axis:   -44 Text Interpretation:  Sinus rhythm LAE, consider biatrial enlargement RBBB and LAFB No significant change since last tracing Confirmed by Blanchie Dessert [phone]) on 02/27/2019 1:19:44 PM   Radiology Ct Angio Head W Or Wo Contrast  Result Date: 02/27/2019 CLINICAL DATA:  Code stroke, slurred speech, right facial droop, left-sided weakness. EXAM: CT ANGIOGRAPHY HEAD AND NECK TECHNIQUE: Multidetector CT imaging of the head and neck was performed using the standard protocol during bolus administration of intravenous contrast. Multiplanar CT image reconstructions and MIPs were obtained to evaluate the vascular anatomy. Carotid stenosis measurements (when applicable) are obtained utilizing NASCET criteria, using the distal internal carotid diameter as the denominator.  CONTRAST:  50mL OMNIPAQUE IOHEXOL 350 MG/ML SOLN COMPARISON:  Concurrent noncontrast head CT, head CT 09/14/2018. FINDINGS: CTA NECK FINDINGS Aortic arch: Standard branching. Scattered atherosclerotic calcification within the visualized aortic arch and major branch vessels. No high-grade narrowing of the major branch vessel origins. Right carotid system: The right common and cervical internal carotid arteries are patent. Atherosclerotic plaque within the right carotid bifurcation and proximal cervical right internal carotid artery. Narrowing of the proximal cervical right ICA with minimal diameter of 2.4 mm. When compared with the normal diameter of the more distal cervical ICA of 3.8,  this corresponds with an estimated stenosis of approximately 40%%. Left carotid system: Atherosclerotic plaque at the left carotid bifurcation and within the proximal left cervical internal carotid artery without significant stenosis (50% or greater). Vertebral arteries: The bilateral cervical vertebral arteries are patent within the neck without significant stenosis (50% or greater). Skeleton: No suspicious lytic or blastic osseous lesions. Other neck: 1.5 cm right thyroid lobe nodule with small associated focus of calcification. No soft tissue neck mass or pathologically enlarged cervical chain lymph nodes. Upper chest: Patchy ground-glass opacity within the imaged lung apices. Review of the MIP images confirms the above findings CTA HEAD FINDINGS Anterior circulation: Calcified and noncalcified atheromatous plaque within the bilateral intracranial internal carotid siphons with regions of mild luminal narrowing. The right middle and anterior cerebral arteries are patent without significant proximal stenosis. The left anterior middle cerebral arteries are patent without significant proximal stenosis. No anterior circulation aneurysm is identified. Posterior circulation: The distal vertebral arteries are patent without significant  stenosis. The basilar artery is patent without significant stenosis. The bilateral posterior cerebral arteries are patent without significant proximal stenosis. No posterior circulation aneurysm is identified. Venous sinuses: Within the limitations of contrast timing, no evidence of thrombosis. Anatomic variants: Posterior communicating arteries are hypoplastic or absent bilaterally. Review of the MIP images confirms the above findings These results were communicated to Dr. Lorraine Lax At 2:06 pmon 8/19/2020by text page via the [facility] messaging system. IMPRESSION: CTA HEAD: No emergent large vessel occlusion. Atherosclerotic disease with regions of mild luminal narrowing in the bilateral carotid siphons. CTA NECK: The bilateral common, cervical internal carotid and cervical vertebral arteries are patent within the neck. Atherosclerotic disease as described. Approximately 40% narrowing of the proximal cervical right ICA. 1.5 cm right thyroid lobe nodule. Thyroid ultrasound is recommended for further evaluation when clinically feasible. Patchy ground-glass opacity within the imaged lung apices may reflect edema. Infection is difficult to exclude Electronically Signed   By: Kellie Simmering   On: 02/27/2019 14:11   Ct Angio Neck W Or Wo Contrast  Result Date: 02/27/2019 CLINICAL DATA:  Code stroke, slurred speech, right facial droop, left-sided weakness. EXAM: CT ANGIOGRAPHY HEAD AND NECK TECHNIQUE: Multidetector CT imaging of the head and neck was performed using the standard protocol during bolus administration of intravenous contrast. Multiplanar CT image reconstructions and MIPs were obtained to evaluate the vascular anatomy. Carotid stenosis measurements (when applicable) are obtained utilizing NASCET criteria, using the distal internal carotid diameter as the denominator. CONTRAST:  25mL OMNIPAQUE IOHEXOL 350 MG/ML SOLN COMPARISON:  Concurrent noncontrast head CT, head CT 09/14/2018. FINDINGS: CTA NECK FINDINGS Aortic  arch: Standard branching. Scattered atherosclerotic calcification within the visualized aortic arch and major branch vessels. No high-grade narrowing of the major branch vessel origins. Right carotid system: The right common and cervical internal carotid arteries are patent. Atherosclerotic plaque within the right carotid bifurcation and proximal cervical right internal carotid artery. Narrowing of the proximal cervical right ICA with minimal diameter of 2.4 mm. When compared with the normal diameter of the more distal cervical ICA of 3.8, this corresponds with an estimated stenosis of approximately 40%%. Left carotid system: Atherosclerotic plaque at the left carotid bifurcation and within the proximal left cervical internal carotid artery without significant stenosis (50% or greater). Vertebral arteries: The bilateral cervical vertebral arteries are patent within the neck without significant stenosis (50% or greater). Skeleton: No suspicious lytic or blastic osseous lesions. Other neck: 1.5 cm right thyroid lobe nodule with small associated focus of calcification.  No soft tissue neck mass or pathologically enlarged cervical chain lymph nodes. Upper chest: Patchy ground-glass opacity within the imaged lung apices. Review of the MIP images confirms the above findings CTA HEAD FINDINGS Anterior circulation: Calcified and noncalcified atheromatous plaque within the bilateral intracranial internal carotid siphons with regions of mild luminal narrowing. The right middle and anterior cerebral arteries are patent without significant proximal stenosis. The left anterior middle cerebral arteries are patent without significant proximal stenosis. No anterior circulation aneurysm is identified. Posterior circulation: The distal vertebral arteries are patent without significant stenosis. The basilar artery is patent without significant stenosis. The bilateral posterior cerebral arteries are patent without significant proximal  stenosis. No posterior circulation aneurysm is identified. Venous sinuses: Within the limitations of contrast timing, no evidence of thrombosis. Anatomic variants: Posterior communicating arteries are hypoplastic or absent bilaterally. Review of the MIP images confirms the above findings These results were communicated to Dr. Lorraine Lax At 2:06 pmon 8/19/2020by text page via the [facility] messaging system. IMPRESSION: CTA HEAD: No emergent large vessel occlusion. Atherosclerotic disease with regions of mild luminal narrowing in the bilateral carotid siphons. CTA NECK: The bilateral common, cervical internal carotid and cervical vertebral arteries are patent within the neck. Atherosclerotic disease as described. Approximately 40% narrowing of the proximal cervical right ICA. 1.5 cm right thyroid lobe nodule. Thyroid ultrasound is recommended for further evaluation when clinically feasible. Patchy ground-glass opacity within the imaged lung apices may reflect edema. Infection is difficult to exclude Electronically Signed   By: Kellie Simmering   On: 02/27/2019 14:11   Ct Head Code Stroke Wo Contrast  Result Date: 02/27/2019 CLINICAL DATA:  Code stroke. Focal neuro deficit, greater than 6 hours, stroke suspected, possible stroke. Slurred speech, right facial droop, left-sided weakness. EXAM: CT HEAD WITHOUT CONTRAST TECHNIQUE: Contiguous axial images were obtained from the base of the skull through the vertex without intravenous contrast. COMPARISON:  Head CT 09/14/2018 FINDINGS: Brain: There is no acute intracranial hemorrhage or demarcated territorial infarction. No evidence of intracranial mass. No midline shift or extra-axial collection. Cerebral volume is normal. Vascular: No hyperdense vessel. Atherosclerotic calcification of the carotid siphons and vertebrobasilar system. Skull: No calvarial fracture Sinuses/Orbits: Imaged globes and orbits demonstrate no acute abnormality. No significant paranasal sinus disease or  mastoid effusion. Other: Small metallic foreign body within the left frontotemporal scalp soft tissues ASPECTS ([place] Stroke Program Early CT Score) - Ganglionic level infarction (caudate, lentiform nuclei, internal capsule, insula, M1-M3 cortex): 7 - Supraganglionic infarction (M4-M6 cortex): 3 Total score (0-10 with 10 being normal): 10 These results were called by telephone at the time of interpretation on 02/27/2019 at 1:25 pm to Dr. Lorraine Lax, who verbally acknowledged these results. IMPRESSION: No acute intracranial hemorrhage or demarcated territorial infarction. ASPECTS 10. Electronically Signed   By: Kellie Simmering   On: 02/27/2019 13:38    Procedures Procedures (including critical care time)  Medications Ordered in ED Medications  sodium chloride 0.9 % bolus 1,000 mL (has no administration in time range)  metoCLOPramide (REGLAN) injection 10 mg (10 mg Intravenous Given 02/27/19 1413)  iohexol (OMNIPAQUE) 350 MG/ML injection 75 mL (75 mLs Intravenous Contrast Given 02/27/19 1334)     Initial Impression / Assessment and Plan / ED Course  I have reviewed the triage vital signs and the nursing notes.  Pertinent labs & imaging results that were available during my care of the patient were reviewed by me and considered in my medical decision making (see chart for details).  47 year old female with multiple risk factors presenting with strokelike symptoms.  Patient does have deficits to the left side with significant facial droop and numbness.  Patient was called a code stroke because she was last normal at 5 PM yesterday so is within the 24-hour window.  Difficult to know if she has LVO because she has blurry vision on the left side and it was hard to discern whether there is a visual field cut.  She has no signs of neglect.  She denies any dizziness or posterior symptoms.  Patient denies any preceding symptoms other than mowing the yard and getting a little bit hot.  She denies any  infectious symptoms and is in no acute distress on exam.  Blood pressure is within normal limits.  Labs and imaging pending.  Because patient is having a headache she was given Reglan and IV fluid as concern for potential complicated migraine.    4:02 PM Patient's CT and CTA show no evidence of acute stroke.  Labs are significant with hyperglycemia and patient given an IV bolus.  After Reglan for her headache she is feeling better.  She is currently sleeping and facial droop is gone.  Neurology has evaluated and do not feel she meet stroke criteria at this time.  They feel that if patient feels well enough to go home that would be fine but if not she can be admitted for observation and repeat imaging tomorrow as she is not a candidate for MRI.  No evidence of DKA today with a anion gap of 15.  Final Clinical Impressions(s) / ED Diagnoses   Final diagnoses:  None    ED Discharge Orders    None       Blanchie Dessert, MD 02/27/19 [phone]

## 2019-02-27 NOTE — ED Triage Notes (Signed)
Per [facility] patient coming from home due to slurred speech, right sided facial droop, left sided weakness and headache. Husband states LNK 5pm yesterday.

## 2019-02-28 ENCOUNTER — Telehealth: Payer: Self-pay | Admitting: Primary Care

## 2019-02-28 NOTE — Telephone Encounter (Signed)
Noted, patient has a follow up with PCP on 03/12/19.

## 2019-02-28 NOTE — Telephone Encounter (Signed)
Called patient to set up ED Follow up appt. Left detailed message to schedule ED Follow up with Gentry Fitz, NP. Pt seen in ED 02/27/2019 for left sided weakness and slurred speech. Pt evaluated, did not meet Stroke Criteria. Pt d/c'd home and advised to follow up with PCP

## 2019-02-28 NOTE — Telephone Encounter (Signed)
Noted.  Will evaluate. 

## 2019-03-05 DIAGNOSIS — H16213 Exposure keratoconjunctivitis, bilateral: Secondary | ICD-10-CM | POA: Diagnosis not present

## 2019-03-05 DIAGNOSIS — E113593 Type 2 diabetes mellitus with proliferative diabetic retinopathy without macular edema, bilateral: Secondary | ICD-10-CM | POA: Diagnosis not present

## 2019-03-05 DIAGNOSIS — H04122 Dry eye syndrome of left lacrimal gland: Secondary | ICD-10-CM | POA: Diagnosis not present

## 2019-03-07 ENCOUNTER — Other Ambulatory Visit: Payer: Self-pay | Admitting: Primary Care

## 2019-03-07 DIAGNOSIS — J449 Chronic obstructive pulmonary disease, unspecified: Secondary | ICD-10-CM

## 2019-03-10 ENCOUNTER — Other Ambulatory Visit: Payer: Self-pay | Admitting: Primary Care

## 2019-03-10 DIAGNOSIS — K219 Gastro-esophageal reflux disease without esophagitis: Secondary | ICD-10-CM

## 2019-03-12 ENCOUNTER — Encounter: Payer: Self-pay | Admitting: Primary Care

## 2019-03-12 ENCOUNTER — Other Ambulatory Visit: Payer: Self-pay

## 2019-03-12 ENCOUNTER — Ambulatory Visit (INDEPENDENT_AMBULATORY_CARE_PROVIDER_SITE_OTHER): Payer: BC Managed Care – PPO | Admitting: Primary Care

## 2019-03-12 VITALS — BP 130/70 | HR 94 | Temp 98.1°F | Ht 64.0 in | Wt 281.8 lb

## 2019-03-12 DIAGNOSIS — R531 Weakness: Secondary | ICD-10-CM

## 2019-03-12 DIAGNOSIS — Z23 Encounter for immunization: Secondary | ICD-10-CM | POA: Diagnosis not present

## 2019-03-12 DIAGNOSIS — R296 Repeated falls: Secondary | ICD-10-CM

## 2019-03-12 DIAGNOSIS — G459 Transient cerebral ischemic attack, unspecified: Secondary | ICD-10-CM | POA: Diagnosis not present

## 2019-03-12 DIAGNOSIS — I6521 Occlusion and stenosis of right carotid artery: Secondary | ICD-10-CM

## 2019-03-12 NOTE — Addendum Note (Signed)
Addended by: Jacqualin Combes on: 03/12/2019 11:25 AM   Modules accepted: Orders

## 2019-03-12 NOTE — Progress Notes (Signed)
Subjective:    Patient ID: [name], female    DOB: Sep 28, 1970, 48 y.o.   MRN: [id number]  HPI  [name] is a 48 year old female with a history of uncontrolled diabetes, hypertension, CVD, TIA, CHF, COPD, diabetic polyneuropathy, chronic back pain, seizure disorder who presents today for ED follow up.  She presented to [facility] on 02/27/19 with a chief complaint of left sided numbness/tingling to the face, upper and lower extremities than began a few hours prior. Also endorsed weakness, blurred vision, and headaches.   During her stay in the ED she underwent CT and CTA of her head which was negative for acute stroke. She underwent lab work which showed hyperglycemia so she was provided with an IV bolus of NS. She was treated with Reglan for headache and felt better. Her facial drooping resolved. Neurology evaluated patient in the ED and did not believe her symptoms and presentation didn't meet criteria for stroke admission. She was discharged home later that evening with recommendations for PCP follow up.  Since her discharge home she denies slurred speech, facial drooping. She continues to have an intermittent headache that is located to the parietal lobe with radiation to bilateral temporal region. She is taking Tylenol without much help. Also with dry eyes with some difficulty closing her left eye, has seen the eye doctor. Her left ear has been bothering her for the last week. She denies fevers, increased cough.   She has been struggling with balance and walking since her groin abscess surgeries last year but worse recently with stroke symptoms. She is trying to get up during the day to walk some but is unsteady with a cane. She cannot walk outside. She has not fallen.   BP Readings from Last 3 Encounters:  03/12/19 130/70  02/27/19 123/77  11/16/18 128/80     Review of Systems  Constitutional: Negative for fever.  Respiratory: Negative for cough and shortness of breath.     Musculoskeletal:       Imbalance, difficulty with ambulation with cane  Neurological: Positive for weakness and headaches. Negative for dizziness.       Past Medical History:  Diagnosis Date   Anxiety    takes Prozac daily   Anxiety    Aortic valve calcification    Asthma    Advair and Spirva daily   Asthma    Bipolar disorder (HCC)    CAD (coronary artery disease)    a. LHC 11/2013 done for CP/fluid retention: mild disease in prox LAD, mild-mod disease in mRCA, EF 60% with normal LVEDP. b. Normal nuc 03/2016.   CHF (congestive heart failure) (HCC)    Chronic diastolic CHF (congestive heart failure) (HCC)    CKD (chronic kidney disease), stage II    COPD (chronic obstructive pulmonary disease) (HCC)    a. nocturnal O2.   COPD (chronic obstructive pulmonary disease) (HCC)    Coronary artery disease    Decreased urine stream    Diabetes mellitus    Diabetes mellitus without complication (HCC)    Dyspnea    Family history of adverse reaction to anesthesia    mom gets nauseated   GERD (gastroesophageal reflux disease)    takes Pepcid daily   History of blood clots    left leg 3-71yrs ago   Hyperlipidemia    Hypertension    Hypertriglyceridemia    Inguinal hernia, left 01/2015   Muscle spasm    Open wound of genital labia    Peripheral  neuropathy    RBBB    Seizures ([place])    Sepsis ([place]) 01/19/2018   Sinus tachycardia    a. persistent since 2009.   Smokers' cough ([place])    Stroke ([place]) 1989   left sided weakness   TIA (transient ischemic attack)    Tobacco abuse    Vulvar abscess 01/23/2018     Social History   Socioeconomic History   Marital status: Married    Spouse name: Not on file   Number of children: 2   Years of education: Not on file   Highest education level: Not on file  Occupational History   Not on file  Social Needs   Financial resource strain: Not on file   Food insecurity    Worry: Not on file     Inability: Not on file   Transportation needs    Medical: Not on file    Non-medical: Not on file  Tobacco Use   Smoking status: Current Some Day Smoker    Packs/day: 0.50    Years: 36.00    Pack years: 18.00    Types: Cigarettes    Last attempt to quit: 01/18/2018    Years since quitting: 1.1   Smokeless tobacco: Never Used  Substance and Sexual Activity   Alcohol use: No    Frequency: Never   Drug use: No   Sexual activity: Not Currently  Lifestyle   Physical activity    Days per week: Patient refused    Minutes per session: Patient refused   Stress: Only a little  Relationships   Social connections    Talks on phone: More than three times a week    Gets together: Patient refused    Attends religious service: Patient refused    Active member of club or organization: Patient refused    Attends meetings of clubs or organizations: Patient refused    Relationship status: Married   Intimate partner violence    Fear of current or ex partner: Patient refused    Emotionally abused: Patient refused    Physically abused: Patient refused    Forced sexual activity: Patient refused  Other Topics Concern   Not on file  Social History Narrative   ** Merged History Encounter **       Lives in [place]   Married but lives with Boyfriend - not legally separated   Disabled - for BiPolar, Seizure disorder, diabetes   Formerly worked at [employer]          Past Surgical History:  Procedure Laterality Date   HERNIA REPAIR     INCISION AND DRAINAGE ABSCESS N/A 01/26/2018   Procedure: INCISION AND DEBRIDEMENT OF VULVAR NECROTIZING SOFT TISSUE INFECTION;  Surgeon: Greer Pickerel, MD;  Location: [place];  Service: General;  Laterality: N/A;   INCISION AND DRAINAGE PERIRECTAL ABSCESS N/A 01/22/2018   Procedure: IRRIGATION AND DEBRIDEMENT LABIAL/VULVAR AREA;  Surgeon: Coralie Keens, MD;  Location: [place];  Service: General;  Laterality: N/A;   INCISION AND DRAINAGE PERIRECTAL  ABSCESS N/A 01/29/2018   Procedure: IRRIGATION AND DEBRIDEMENT VULVA;  Surgeon: Excell Seltzer, MD;  Location: [place];  Service: General;  Laterality: N/A;   INGUINAL HERNIA REPAIR Left 04/08/2015   Procedure: OPEN LEFT INGUINAL HERNIA REPAIR WITH MESH;  Surgeon: Ralene Ok, MD;  Location: [place];  Service: General;  Laterality: Left;   INSERTION OF MESH Left 04/08/2015   Procedure: INSERTION OF MESH;  Surgeon: Ralene Ok, MD;  Location: [place];  Service: General;  Laterality: Left;   LAPAROSCOPY     Endometriosis   LEFT HEART CATHETERIZATION WITH CORONARY ANGIOGRAM N/A 12/05/2013   Procedure: LEFT HEART CATHETERIZATION WITH CORONARY ANGIOGRAM;  Surgeon: Jettie Booze, MD;  Location: [facility] CATH LAB;  Service: Cardiovascular;  Laterality: N/A;   right kidney drained     TEE WITHOUT CARDIOVERSION N/A 11/28/2013   Procedure: TRANSESOPHAGEAL ECHOCARDIOGRAM (TEE);  Surgeon: Thayer Headings, MD;  [facility] ENDOSCOPY;  Service: Cardiovascular;  Laterality: N/A;    Family History  Problem Relation Age of Onset   Venous thrombosis Brother    Other Brother        BRAIN TUMOR   Asthma Father    Diabetes Father    Coronary artery disease Mother    Hypertension Mother    Diabetes Mother    Asthma Sister    Diabetes type II Brother     Allergies  Allergen Reactions   Adhesive [Tape] Rash and Other (See Comments)    TAKES OFF THE SKIN (CERTAIN MEDICAL TAPES DO THIS!!)   Metoprolol Shortness Of Breath    Occurrence of shortness of breath after 3 days   Montelukast Shortness Of Breath   Morphine Sulfate Anaphylaxis, Shortness Of Breath and Nausea And Vomiting    Swollen Throat - Able to tolerate dilaudid   Penicillins Anaphylaxis, Hives and Shortness Of Breath    Throat swells Has patient had a PCN reaction causing immediate rash, facial/tongue/throat swelling, SOB or lightheadedness with hypotension: Yes Has patient had a PCN reaction causing severe rash  involving mucus membranes or skin necrosis: No Has patient had a PCN reaction that required hospitalization: Yes Has patient had a PCN reaction occurring within the last 10 years: No If all of the above answers are "NO", then may proceed with Cephalosporin use.    Prednisone Anaphylaxis   Diltiazem Swelling   Gabapentin Swelling    Current Outpatient Medications on File Prior to Visit  Medication Sig Dispense Refill   acetaminophen (TYLENOL) 500 MG tablet Take 500 mg by mouth every 4 (four) hours as needed for moderate pain.      albuterol (VENTOLIN HFA) 108 (90 Base) MCG/ACT inhaler INHALE 2 PUFFS BY MOUTH INTO THE LUNGS EVERY 4 HOURS AS NEEDED FOR SHORTNESS OFBREATH 6.7 g 0   ALPRAZolam (XANAX) 0.5 MG tablet Take 1 tablet (0.5 mg total) by mouth 2 (two) times daily as needed for anxiety.  0   aspirin EC 81 MG tablet Take 81 mg by mouth every morning.     budesonide (PULMICORT) 0.5 MG/2ML nebulizer solution Take 0.5 mg by nebulization 2 (two) times daily.     budesonide-formoterol (SYMBICORT) 80-4.5 MCG/ACT inhaler INHALE 2 PUFFS BY MOUTH EVERY 12 HOURS TO PREVENT COUGH OR WHEEZING *RINSE MOUTH AFTER USE* (Patient taking differently: Inhale 2 puffs into the lungs every 12 (twelve) hours. ) 10.2 g 2   busPIRone (BUSPAR) 30 MG tablet Take 30 mg by mouth 2 (two) times daily.     FLUoxetine (PROZAC) 40 MG capsule Take 40 mg by mouth daily.      glucose blood (ONETOUCH VERIO) test strip Use to monitor glucose levels three times daily; E11.9; E10.42 100 each 12   Insulin Pen Needle 32G X 4 MM MISC Used to give insulin injections twice daily. 100 each 11   insulin regular human CONCENTRATED (HUMULIN R U-500 KWIKPEN) 500 UNIT/ML kwikpen Inject 250 Units into the skin 2 (two) times a day. 14 pen 11   ipratropium-albuterol (DUONEB) 0.5-2.5 (3)  MG/3ML SOLN Take 3 mLs by nebulization every 4 (four) hours as needed (wheezing/shortness of breath). 360 mL 0   loperamide (IMODIUM A-D) 2 MG  tablet Take 4 mg by mouth 3 (three) times daily as needed for diarrhea or loose stools.     OneTouch Delica Lancets [id number] MISC 1 each by Does not apply route 3 (three) times daily. Use to monitor glucose levels three times daily; E11.9; E10.42 100 each 11   OXYGEN Inhale 4 L into the lungs at bedtime.      pregabalin (LYRICA) 300 MG capsule TAKE 1 CAPSULE BY MOUTH TWICE DAILY (Patient taking differently: Take 300 mg by mouth daily. ) 180 capsule 0   rOPINIRole (REQUIP) 1 MG tablet TAKE 1 TABLET BY MOUTH EVERY NIGHT AT BEDTIME FOR RESTLESS LEGS. (Patient taking differently: Take 1 mg by mouth at bedtime. ) 90 tablet 1   spironolactone (ALDACTONE) 50 MG tablet Take 1 tablet (50 mg total) by mouth daily. 90 tablet 0   tobramycin (TOBREX) 0.3 % ophthalmic solution Place 1 drop into both eyes daily.      torsemide (DEMADEX) 20 MG tablet Take 40 mg by mouth 2 (two) times daily.     traZODone (DESYREL) 100 MG tablet Take 100 mg by mouth at bedtime.      ULTICARE MINI PEN NEEDLES 31G X 6 MM MISC USE AS DIRECTED THREE TIMES DAILY 100 each 0   No current facility-administered medications on file prior to visit.     BP 130/70    Pulse 94    Temp 98.1 F (36.7 C) (Temporal)    Ht 5\' 4"  (1.626 m)    Wt 281 lb 12 oz (127.8 kg)    LMP 11/11/2011    SpO2 94%    BMI 48.36 kg/m    Objective:   Physical Exam  Constitutional: She is oriented to person, place, and time. She appears well-nourished.  Eyes: EOM are normal.  Neck: Neck supple.  Cardiovascular: Normal rate and regular rhythm.  Respiratory: Effort normal and breath sounds normal.  Neurological: She is alert and oriented to person, place, and time.  Skin: Skin is warm and dry.  Psychiatric: She has a normal mood and affect.           Assessment & Plan:

## 2019-03-12 NOTE — Assessment & Plan Note (Signed)
Difficulty with imbalance during ambulation, referral placed to home health PT.

## 2019-03-12 NOTE — Patient Instructions (Addendum)
Call the neurologist for an appointment as discussed.  You will be contacted regarding your home health physical therapy.  Please let us know if you have not been contacted within one week.   You can try Excedrin Migraine for the headaches, do not take extra Aspirin when taking this.  Nasal Congestion/Ear Pressure/Sinus Pressure: Try using Flonase (fluticasone) nasal spray. Instill 1 spray in each nostril twice daily.   It was a pleasure to see you today!

## 2019-03-12 NOTE — Assessment & Plan Note (Signed)
Symptoms representative of TIA, evaluated by neurology in the ED who recommended outpatient follow up.  No recurrent symptoms that she had during initial presentation to the ED. Exam today appears stable. Recommended she call neurology for outpatient evaluation.   Discussed Flonase for earache, Excedrin Migraine (hold OTC aspirin) for headache.   Hospital notes, labs, imaging reviewed.

## 2019-03-17 DIAGNOSIS — I130 Hypertensive heart and chronic kidney disease with heart failure and stage 1 through stage 4 chronic kidney disease, or unspecified chronic kidney disease: Secondary | ICD-10-CM | POA: Insufficient documentation

## 2019-03-19 ENCOUNTER — Other Ambulatory Visit: Payer: Self-pay | Admitting: Cardiology

## 2019-03-21 ENCOUNTER — Encounter (HOSPITAL_COMMUNITY): Payer: Self-pay

## 2019-03-21 ENCOUNTER — Emergency Department (HOSPITAL_COMMUNITY): Payer: BC Managed Care – PPO

## 2019-03-21 ENCOUNTER — Emergency Department (HOSPITAL_COMMUNITY)
Admission: EM | Admit: 2019-03-21 | Discharge: 2019-03-21 | Disposition: A | Discharge status: Home / Self Care | Payer: BC Managed Care – PPO | Attending: Emergency Medicine | Admitting: Emergency Medicine

## 2019-03-21 ENCOUNTER — Telehealth: Payer: Self-pay | Admitting: Primary Care

## 2019-03-21 ENCOUNTER — Other Ambulatory Visit: Payer: Self-pay

## 2019-03-21 DIAGNOSIS — G40909 Epilepsy, unspecified, not intractable, without status epilepticus: Secondary | ICD-10-CM | POA: Diagnosis not present

## 2019-03-21 DIAGNOSIS — I2510 Atherosclerotic heart disease of native coronary artery without angina pectoris: Secondary | ICD-10-CM | POA: Insufficient documentation

## 2019-03-21 DIAGNOSIS — I130 Hypertensive heart and chronic kidney disease with heart failure and stage 1 through stage 4 chronic kidney disease, or unspecified chronic kidney disease: Secondary | ICD-10-CM | POA: Insufficient documentation

## 2019-03-21 DIAGNOSIS — R0902 Hypoxemia: Secondary | ICD-10-CM | POA: Diagnosis not present

## 2019-03-21 DIAGNOSIS — R29818 Other symptoms and signs involving the nervous system: Secondary | ICD-10-CM | POA: Diagnosis not present

## 2019-03-21 DIAGNOSIS — E1165 Type 2 diabetes mellitus with hyperglycemia: Secondary | ICD-10-CM | POA: Insufficient documentation

## 2019-03-21 DIAGNOSIS — Z79899 Other long term (current) drug therapy: Secondary | ICD-10-CM | POA: Insufficient documentation

## 2019-03-21 DIAGNOSIS — E1122 Type 2 diabetes mellitus with diabetic chronic kidney disease: Secondary | ICD-10-CM | POA: Diagnosis not present

## 2019-03-21 DIAGNOSIS — N189 Chronic kidney disease, unspecified: Secondary | ICD-10-CM | POA: Diagnosis not present

## 2019-03-21 DIAGNOSIS — R531 Weakness: Secondary | ICD-10-CM | POA: Diagnosis not present

## 2019-03-21 DIAGNOSIS — R4781 Slurred speech: Secondary | ICD-10-CM | POA: Diagnosis not present

## 2019-03-21 DIAGNOSIS — F419 Anxiety disorder, unspecified: Secondary | ICD-10-CM | POA: Diagnosis not present

## 2019-03-21 DIAGNOSIS — R739 Hyperglycemia, unspecified: Secondary | ICD-10-CM | POA: Diagnosis not present

## 2019-03-21 DIAGNOSIS — J449 Chronic obstructive pulmonary disease, unspecified: Secondary | ICD-10-CM | POA: Insufficient documentation

## 2019-03-21 DIAGNOSIS — K219 Gastro-esophageal reflux disease without esophagitis: Secondary | ICD-10-CM | POA: Diagnosis not present

## 2019-03-21 DIAGNOSIS — M549 Dorsalgia, unspecified: Secondary | ICD-10-CM | POA: Diagnosis not present

## 2019-03-21 DIAGNOSIS — F17210 Nicotine dependence, cigarettes, uncomplicated: Secondary | ICD-10-CM | POA: Insufficient documentation

## 2019-03-21 DIAGNOSIS — I509 Heart failure, unspecified: Secondary | ICD-10-CM | POA: Insufficient documentation

## 2019-03-21 DIAGNOSIS — J189 Pneumonia, unspecified organism: Secondary | ICD-10-CM

## 2019-03-21 DIAGNOSIS — Z7982 Long term (current) use of aspirin: Secondary | ICD-10-CM | POA: Insufficient documentation

## 2019-03-21 DIAGNOSIS — I5032 Chronic diastolic (congestive) heart failure: Secondary | ICD-10-CM | POA: Diagnosis not present

## 2019-03-21 DIAGNOSIS — J45909 Unspecified asthma, uncomplicated: Secondary | ICD-10-CM | POA: Diagnosis not present

## 2019-03-21 DIAGNOSIS — I69354 Hemiplegia and hemiparesis following cerebral infarction affecting left non-dominant side: Secondary | ICD-10-CM | POA: Diagnosis not present

## 2019-03-21 DIAGNOSIS — R29810 Facial weakness: Secondary | ICD-10-CM | POA: Diagnosis not present

## 2019-03-21 DIAGNOSIS — F319 Bipolar disorder, unspecified: Secondary | ICD-10-CM | POA: Diagnosis not present

## 2019-03-21 DIAGNOSIS — N182 Chronic kidney disease, stage 2 (mild): Secondary | ICD-10-CM | POA: Diagnosis not present

## 2019-03-21 DIAGNOSIS — G8929 Other chronic pain: Secondary | ICD-10-CM | POA: Diagnosis not present

## 2019-03-21 DIAGNOSIS — Z794 Long term (current) use of insulin: Secondary | ICD-10-CM | POA: Diagnosis not present

## 2019-03-21 DIAGNOSIS — Z9181 History of falling: Secondary | ICD-10-CM | POA: Diagnosis not present

## 2019-03-21 DIAGNOSIS — E1142 Type 2 diabetes mellitus with diabetic polyneuropathy: Secondary | ICD-10-CM | POA: Diagnosis not present

## 2019-03-21 DIAGNOSIS — E785 Hyperlipidemia, unspecified: Secondary | ICD-10-CM | POA: Diagnosis not present

## 2019-03-21 LAB — URINALYSIS, ROUTINE W REFLEX MICROSCOPIC
Bilirubin Urine: NEGATIVE
Glucose, UA: >=500 mg/dL — AB
Ketones, ur: NEGATIVE mg/dL
Nitrite: NEGATIVE
Protein, ur: 30 mg/dL — AB
Specific Gravity, Urine: 1.007 (ref 1.005–1.030)
pH: 5 (ref 5.0–8.0)

## 2019-03-21 LAB — POCT I-STAT 7, (LYTES, BLD GAS, ICA,H+H)
Acid-Base Excess: 5 mmol/L — ABNORMAL HIGH (ref 0.0–2.0)
Bicarbonate: 30.6 mmol/L — ABNORMAL HIGH (ref 20.0–28.0)
Calcium, Ion: 1.25 mmol/L (ref 1.15–1.40)
HCT: 43 % (ref 36.0–46.0)
Hemoglobin: 14.6 g/dL (ref 12.0–15.0)
O2 Saturation: 88 %
Patient temperature: 98.6
Potassium: 3.6 mmol/L (ref 3.5–5.1)
Sodium: 133 mmol/L — ABNORMAL LOW (ref 135–145)
TCO2: 32 mmol/L (ref 22–32)
pCO2 arterial: 49.3 mmHg — ABNORMAL HIGH (ref 32.0–48.0)
pH, Arterial: 7.401 (ref 7.350–7.450)
pO2, Arterial: 56 mmHg — ABNORMAL LOW (ref 83.0–108.0)

## 2019-03-21 LAB — COMPREHENSIVE METABOLIC PANEL
ALT: 45 U/L — ABNORMAL HIGH (ref 0–44)
AST: 40 U/L (ref 15–41)
Albumin: 3.4 g/dL — ABNORMAL LOW (ref 3.5–5.0)
Alkaline Phosphatase: 93 U/L (ref 38–126)
Anion gap: 14 (ref 5–15)
BUN: 36 mg/dL — ABNORMAL HIGH (ref 6–20)
CO2: 27 mmol/L (ref 22–32)
Calcium: 9.5 mg/dL (ref 8.9–10.3)
Chloride: 89 mmol/L — ABNORMAL LOW (ref 98–111)
Creatinine, Ser: 1.47 mg/dL — ABNORMAL HIGH (ref 0.44–1.00)
GFR calc Af Amer: 48 mL/min — ABNORMAL LOW (ref >60)
GFR calc non Af Amer: 41 mL/min — ABNORMAL LOW (ref >60)
Glucose, Bld: 434 mg/dL — ABNORMAL HIGH (ref 70–99)
Potassium: 3.8 mmol/L (ref 3.5–5.1)
Sodium: 130 mmol/L — ABNORMAL LOW (ref 135–145)
Total Bilirubin: 0.6 mg/dL (ref 0.3–1.2)
Total Protein: 8 g/dL (ref 6.5–8.1)

## 2019-03-21 LAB — DIFFERENTIAL
Abs Immature Granulocytes: 0.05 10*3/uL (ref 0.00–0.07)
Basophils Absolute: 0.1 10*3/uL (ref 0.0–0.1)
Basophils Relative: 1 %
Eosinophils Absolute: 0.2 10*3/uL (ref 0.0–0.5)
Eosinophils Relative: 3 %
Immature Granulocytes: 1 %
Lymphocytes Relative: 18 %
Lymphs Abs: 1.7 10*3/uL (ref 0.7–4.0)
Monocytes Absolute: 0.6 10*3/uL (ref 0.1–1.0)
Monocytes Relative: 7 %
Neutro Abs: 6.8 10*3/uL (ref 1.7–7.7)
Neutrophils Relative %: 70 %

## 2019-03-21 LAB — CBG MONITORING, ED: Glucose-Capillary: 308 mg/dL — ABNORMAL HIGH (ref 70–99)

## 2019-03-21 LAB — CBC
HCT: 41.4 % (ref 36.0–46.0)
Hemoglobin: 13.7 g/dL (ref 12.0–15.0)
MCH: 28.8 pg (ref 26.0–34.0)
MCHC: 33.1 g/dL (ref 30.0–36.0)
MCV: 87 fL (ref 80.0–100.0)
Platelets: 194 10*3/uL (ref 150–400)
RBC: 4.76 MIL/uL (ref 3.87–5.11)
RDW: 17.3 % — ABNORMAL HIGH (ref 11.5–15.5)
WBC: 9.4 10*3/uL (ref 4.0–10.5)
nRBC: 0 % (ref 0.0–0.2)

## 2019-03-21 LAB — PROTIME-INR
INR: 1 (ref 0.8–1.2)
Prothrombin Time: 13.2 seconds (ref 11.4–15.2)

## 2019-03-21 LAB — POCT I-STAT EG7
Acid-Base Excess: 8 mmol/L — ABNORMAL HIGH (ref 0.0–2.0)
Bicarbonate: 32.9 mmol/L — ABNORMAL HIGH (ref 20.0–28.0)
Calcium, Ion: 1.14 mmol/L — ABNORMAL LOW (ref 1.15–1.40)
HCT: 44 % (ref 36.0–46.0)
Hemoglobin: 15 g/dL (ref 12.0–15.0)
O2 Saturation: 85 %
Potassium: 4 mmol/L (ref 3.5–5.1)
Sodium: 133 mmol/L — ABNORMAL LOW (ref 135–145)
TCO2: 34 mmol/L — ABNORMAL HIGH (ref 22–32)
pCO2, Ven: 46.3 mmHg (ref 44.0–60.0)
pH, Ven: 7.459 — ABNORMAL HIGH (ref 7.250–7.430)
pO2, Ven: 48 mmHg — ABNORMAL HIGH (ref 32.0–45.0)

## 2019-03-21 LAB — I-STAT CHEM 8, ED
BUN: 37 mg/dL — ABNORMAL HIGH (ref 6–20)
Calcium, Ion: 1.08 mmol/L — ABNORMAL LOW (ref 1.15–1.40)
Chloride: 92 mmol/L — ABNORMAL LOW (ref 98–111)
Creatinine, Ser: 1.3 mg/dL — ABNORMAL HIGH (ref 0.44–1.00)
Glucose, Bld: 429 mg/dL — ABNORMAL HIGH (ref 70–99)
HCT: 43 % (ref 36.0–46.0)
Hemoglobin: 14.6 g/dL (ref 12.0–15.0)
Potassium: 3.7 mmol/L (ref 3.5–5.1)
Sodium: 131 mmol/L — ABNORMAL LOW (ref 135–145)
TCO2: 29 mmol/L (ref 22–32)

## 2019-03-21 LAB — RAPID URINE DRUG SCREEN, HOSP PERFORMED
Amphetamines: NOT DETECTED
Barbiturates: NOT DETECTED
Benzodiazepines: POSITIVE — AB
Cocaine: NOT DETECTED
Opiates: NOT DETECTED
Tetrahydrocannabinol: NOT DETECTED

## 2019-03-21 LAB — I-STAT BETA HCG BLOOD, ED (MC, WL, AP ONLY): I-stat hCG, quantitative: 6.4 m[IU]/mL — ABNORMAL HIGH (ref <5)

## 2019-03-21 LAB — AMMONIA: Ammonia: 41 umol/L — ABNORMAL HIGH (ref 9–35)

## 2019-03-21 LAB — TSH: TSH: 0.736 u[IU]/mL (ref 0.350–4.500)

## 2019-03-21 LAB — APTT: aPTT: 26 seconds (ref 24–36)

## 2019-03-21 MED ORDER — SODIUM CHLORIDE 0.9% FLUSH
3.0000 mL | Freq: Once | INTRAVENOUS | Status: DC
Start: 2019-03-21 — End: 2019-03-21

## 2019-03-21 MED ORDER — ALBUTEROL SULFATE HFA 108 (90 BASE) MCG/ACT IN AERS
2.0000 | INHALATION_SPRAY | RESPIRATORY_TRACT | Status: DC
  Administered 2019-03-21: 2 via RESPIRATORY_TRACT
  Filled 2019-03-21: qty 6.7

## 2019-03-21 MED ORDER — ACETAMINOPHEN 500 MG PO TABS
1000.0000 mg | ORAL_TABLET | Freq: Once | ORAL | Status: AC
  Administered 2019-03-21: 17:00:00 1000 mg via ORAL
  Filled 2019-03-21: qty 2

## 2019-03-21 NOTE — ED Triage Notes (Signed)
Pt from home via ems; per pts husband, pt had onset of slurred speech and L sided weakness around 2330 last night; pt states she has had difficulty walking since Monday  140/80 HR 88 RR 20 96% RA CBG 462 97.3 F

## 2019-03-21 NOTE — Code Documentation (Signed)
48yo female arriving to [facility] via [place] at 1351. Patient from home where she was LKW yesterday at 2330. Of note, patient reports experiencing symptoms since Monday. Patient with left sided weakness and slurred speech and called EMS who activated a code stroke. Stroke team at the bedside on patient arrival. Labs drawn and patient cleared for CT by Dr. Regenia Skeeter. Patient to CT with team. CT completed. NIHSS 12, see documentation for details and code stroke times. Patient with dysarthria and LUE weakness on exam, however, patient noted to avoid her face when arm raised and moves LUE when distracted. Patient is outside the window for treatment with tPA. No acute stroke treatment at this time. Bedside handoff with ED RN Venetia Night.

## 2019-03-21 NOTE — Consult Note (Addendum)
Neurology Consultation  Reason for Consult: Left-sided weakness and slurred speech-code stroke Referring Physician: Emergency department MD  History is obtained from: Patient  HPI: [name] is a 48 y.o. female multiple medical problems including tobacco abuse, stroke, seizures, peripheral neuropathy, hypertension, hyperlipidemia, muscle spasms, diabetes, COPD, chronic kidney disease, CHF.  Patient was brought to emergency department as a code stroke secondary to "slurred speech and left-sided weakness".  Patient states that slurred speech and weakness started on Monday which was 3 days ago.  She states that she needs help to the bathroom with her husband.  She admits to continued smoking.   ED course: CT head  Chart review- patient has been seen multiple times in the ED for multiple code strokes for left-sided weakness and slurred speech.  Patient was last seen on 8/19 2020 with CT of head negative, CTA of head with no emergent large vessel occlusion and CT of neck showing left internal carotid and cervical vertebral arteries which were patent.  Patient did have approximately 40% narrowing of the proximal cervical right ICA.   LKW: 03/18/2019 tpa given?: no, out of window Premorbid modified Rankin scale (mRS): 2   ROS: A 14 point ROS was performed and is negative except as noted in the HPI.   Past Medical History:  Diagnosis Date  . Anxiety    takes Prozac daily  . Anxiety   . Aortic valve calcification   . Asthma    Advair and Spirva daily  . Asthma   . Bipolar disorder ([place])   . CAD (coronary artery disease)    a. LHC 11/2013 done for CP/fluid retention: mild disease in prox LAD, mild-mod disease in mRCA, EF 60% with normal LVEDP. b. Normal nuc 03/2016.  Marland Kitchen CHF (congestive heart failure) ([place])   . Chronic diastolic CHF (congestive heart failure) ([place])   . CKD (chronic kidney disease), stage II   . COPD (chronic obstructive pulmonary disease) ([facility])    a. nocturnal O2.  Marland Kitchen COPD  (chronic obstructive pulmonary disease) ([place])   . Coronary artery disease   . Decreased urine stream   . Diabetes mellitus   . Diabetes mellitus without complication ([place])   . Dyspnea   . Family history of adverse reaction to anesthesia    mom gets nauseated  . GERD (gastroesophageal reflux disease)    takes Pepcid daily  . History of blood clots    left leg 3-54yrs ago  . Hyperlipidemia   . Hypertension   . Hypertriglyceridemia   . Inguinal hernia, left 01/2015  . Muscle spasm   . Open wound of genital labia   . Peripheral neuropathy   . RBBB   . Seizures ([place])   . Sepsis ([place]) 01/19/2018  . Sinus tachycardia    a. persistent since 2009.  Marland Kitchen Smokers' cough ([place])   . Stroke [facility]) 1989   left sided weakness  . TIA (transient ischemic attack)   . Tobacco abuse   . Vulvar abscess 01/23/2018   Family History  Problem Relation Age of Onset  . Venous thrombosis Brother   . Other Brother        BRAIN TUMOR  . Asthma Father   . Diabetes Father   . Coronary artery disease Mother   . Hypertension Mother   . Diabetes Mother   . Asthma Sister   . Diabetes type II Brother    Social History:   reports that she has been smoking cigarettes. She has a 18.00 pack-year smoking history.  She has never used smokeless tobacco. She reports that she does not drink alcohol or use drugs.  Medications  Current Facility-Administered Medications:  .  sodium chloride flush (NS) 0.9 % injection 3 mL, 3 mL, Intravenous, Once, Sherwood Gambler, MD  Current Outpatient Medications:  .  acetaminophen (TYLENOL) 500 MG tablet, Take 500 mg by mouth every 4 (four) hours as needed for moderate pain. , Disp: , Rfl:  .  albuterol (VENTOLIN HFA) 108 (90 Base) MCG/ACT inhaler, INHALE 2 PUFFS BY MOUTH INTO THE LUNGS EVERY 4 HOURS AS NEEDED FOR SHORTNESS OFBREATH, Disp: 6.7 g, Rfl: 0 .  ALPRAZolam (XANAX) 0.5 MG tablet, Take 1 tablet (0.5 mg total) by mouth 2 (two) times daily as needed for anxiety., Disp: , Rfl:  0 .  aspirin EC 81 MG tablet, Take 81 mg by mouth every morning., Disp: , Rfl:  .  budesonide (PULMICORT) 0.5 MG/2ML nebulizer solution, Take 0.5 mg by nebulization 2 (two) times daily., Disp: , Rfl:  .  budesonide-formoterol (SYMBICORT) 80-4.5 MCG/ACT inhaler, INHALE 2 PUFFS BY MOUTH EVERY 12 HOURS TO PREVENT COUGH OR WHEEZING *RINSE MOUTH AFTER USE* (Patient taking differently: Inhale 2 puffs into the lungs every 12 (twelve) hours. ), Disp: 10.2 g, Rfl: 2 .  busPIRone (BUSPAR) 30 MG tablet, Take 30 mg by mouth 2 (two) times daily., Disp: , Rfl:  .  FLUoxetine (PROZAC) 40 MG capsule, Take 40 mg by mouth daily. , Disp: , Rfl:  .  glucose blood (ONETOUCH VERIO) test strip, Use to monitor glucose levels three times daily; E11.9; E10.42, Disp: 100 each, Rfl: 12 .  Insulin Pen Needle 32G X 4 MM MISC, Used to give insulin injections twice daily., Disp: 100 each, Rfl: 11 .  insulin regular human CONCENTRATED (HUMULIN R U-500 KWIKPEN) 500 UNIT/ML kwikpen, Inject 250 Units into the skin 2 (two) times a day., Disp: 14 pen, Rfl: 11 .  ipratropium-albuterol (DUONEB) 0.5-2.5 (3) MG/3ML SOLN, Take 3 mLs by nebulization every 4 (four) hours as needed (wheezing/shortness of breath)., Disp: 360 mL, Rfl: 0 .  loperamide (IMODIUM A-D) 2 MG tablet, Take 4 mg by mouth 3 (three) times daily as needed for diarrhea or loose stools., Disp: , Rfl:  .  OneTouch Delica Lancets [id number] MISC, 1 each by Does not apply route 3 (three) times daily. Use to monitor glucose levels three times daily; E11.9; E10.42, Disp: 100 each, Rfl: 11 .  OXYGEN, Inhale 4 L into the lungs at bedtime. , Disp: , Rfl:  .  pregabalin (LYRICA) 300 MG capsule, TAKE 1 CAPSULE BY MOUTH TWICE DAILY (Patient taking differently: Take 300 mg by mouth daily. ), Disp: 180 capsule, Rfl: 0 .  rOPINIRole (REQUIP) 1 MG tablet, TAKE 1 TABLET BY MOUTH EVERY NIGHT AT BEDTIME FOR RESTLESS LEGS. (Patient taking differently: Take 1 mg by mouth at bedtime. ), Disp: 90 tablet,  Rfl: 1 .  spironolactone (ALDACTONE) 50 MG tablet, TAKE 1 TABLET BY MOUTH ONCE A DAY, Disp: 90 tablet, Rfl: 0 .  tobramycin (TOBREX) 0.3 % ophthalmic solution, Place 1 drop into both eyes daily. , Disp: , Rfl:  .  torsemide (DEMADEX) 20 MG tablet, Take 40 mg by mouth 2 (two) times daily., Disp: , Rfl:  .  traZODone (DESYREL) 100 MG tablet, Take 100 mg by mouth at bedtime. , Disp: , Rfl:  .  ULTICARE MINI PEN NEEDLES 31G X 6 MM MISC, USE AS DIRECTED THREE TIMES DAILY, Disp: 100 each, Rfl: 0   Exam: Current  vital signs: BP 102/69   Pulse 81   Temp 97.9 F (36.6 C) (Oral)   Resp 14   Ht 5\' 4"  (1.626 m)   Wt 127 kg   LMP 11/11/2011   SpO2 94%   BMI 48.06 kg/m  Vital signs in last 24 hours: Temp:  [97.9 F (36.6 C)] 97.9 F (36.6 C) (09/10 1424) Pulse Rate:  [81-82] 81 (09/10 1530) Resp:  [12-21] 14 (09/10 1530) BP: (102-133)/(46-69) 102/69 (09/10 1530) SpO2:  [92 %-96 %] 94 % (09/10 1530) Weight:  UF:[id number] kg] 127 kg (09/10 1416)  Physical Exam  Constitutional: Appears well-developed and well-nourished.  Psych: Lethargic Eyes: No scleral injection HENT: No OP obstrucion Head: Normocephalic.  Cardiovascular: Normal rate and regular rhythm.  Respiratory: Effort normal, non-labored breathing GI: Soft.  No distension. There is no tenderness.  Skin: WDI Neuro: Mental Status: Patient is awake, alert, oriented to person, place, month, year, and situation. Patient is able to give a clear and coherent history. Patient showed slow and prolonged pronunciation however when answering some questions there is no slow pronunciation and she quickly answered. Cranial Nerves: II: Visual Fields are full.  III,IV, VI: EOMI without ptosis or diploplia. Pupils equal, round and reactive to light V: Facial sensation is symmetric to temperature VII: Facial movement is symmetric.  VIII: hearing is intact to voice X: Palat elevates symmetrically XI: Shoulder shrug is symmetric. XII: tongue is  midline without atrophy or fasciculations.  Motor: Right arm initially patient was able to hold up but then let it fall to the bed, however after some time she was able to hold the arm for 10 seconds.  Right leg she was able to hold up for approximately 2 seconds and then let it drop against the bed.  Left leg she is not able to lift off the bed however had a positive Hoover sign.  Left arm showed ability to lift off the bed however let us arm drop medially.  When holding hand above forehead patient avoids letting hand drop to forehead. Sensory: Sensation is symmetric to light touch and temperature with lower extremity paresthesias from knee to foot Deep Tendon Reflexes: No deep tendon reflexes noted throughout Plantars: Mute bilaterally Cerebellar:  able to perform  Labs I have reviewed labs in epic and the results pertinent to this consultation are: CBC    Component Value Date/Time   WBC 9.4 03/21/2019 1353   RBC 4.76 03/21/2019 1353   HGB 14.6 03/21/2019 1403   HCT 43.0 03/21/2019 1403   PLT 194 03/21/2019 1353   MCV 87.0 03/21/2019 1353   MCH 28.8 03/21/2019 1353   MCHC 33.1 03/21/2019 1353   RDW 17.3 (H) 03/21/2019 1353   LYMPHSABS 1.7 03/21/2019 1353   MONOABS 0.6 03/21/2019 1353   EOSABS 0.2 03/21/2019 1353   BASOSABS 0.1 03/21/2019 1353   CMP     Component Value Date/Time   NA 131 (L) 03/21/2019 1403   NA 137 01/08/2018 1117   K 3.7 03/21/2019 1403   CL 92 (L) 03/21/2019 1403   CO2 28 02/27/2019 1302   GLUCOSE 429 (H) 03/21/2019 1403   BUN 37 (H) 03/21/2019 1403   BUN 31 (H) 01/08/2018 1117   CREATININE 1.30 (H) 03/21/2019 1403   CREATININE 1.20 (H) 05/24/2016 0938   CALCIUM 10.7 (H) 02/27/2019 1302   PROT 8.7 (H) 02/27/2019 1302   PROT 7.8 01/08/2018 1117   ALBUMIN 3.6 02/27/2019 1302   ALBUMIN 4.1 01/08/2018 1117   AST 48 (  H) 02/27/2019 1302   ALT 40 02/27/2019 1302   ALKPHOS 120 02/27/2019 1302   BILITOT 1.3 (H) 02/27/2019 1302   BILITOT 0.5 01/08/2018  1117   GFRNONAA 39 (L) 02/27/2019 1302   GFRAA 45 (L) 02/27/2019 1302    Lipid Panel     Component Value Date/Time   CHOL 146 09/14/2018 0317   TRIG 135 09/14/2018 0317   HDL 21 (L) 09/14/2018 0317   CHOLHDL 7.0 09/14/2018 0317   VLDL 27 09/14/2018 0317   LDLCALC 98 09/14/2018 0317   LDLDIRECT 145 (H) 04/30/2009 2013     Imaging I have reviewed the images obtained:  CT-scan of the brain-no intracranial abnormality  MRI examination of the brain-unable to be obtained secondary to BB in scalp  Etta Quill PA-C [facility] [phone]  M-F  (9:00 am- 5:00 PM)  03/21/2019, 2:13 PM    Attending addendum Patient seen and examined Agree with history and physical documented above, which I independently obtained. Independently examined patient Independently reviewed imaging-CT head with no new changes.    Assessment:  48 year old female presenting to [facility] as a code stroke secondary to 3-day history of bilateral lower extremity weakness/left-sided arm weakness and abnormal speech.  Patient has presented multiple times to this hospital with very similar symptoms showing no acute CVA.  At this point in time, stroke is low on the differential, however will observe to see if she improves, in addition must consider conversion disorder.  Impression: Nonspecific dysarthria, toxic metabolic encephalopathy, low on differentials are seizure and stroke as well as high on the differential is conversion disorder.  Recommendations: -UA -Chest x-ray -ABG - Observe patient to see if she improves.  If no improvement would recommend admitting to hospital for observation and obtain repeat CT of head on 03/22/2019.  As this presentation is very similar to previous presentations stroke is low on the differential.  Conversion disorder is also on the differential. -Continue home medications. Not a candidate for TPA and endovascular due to being outside the window as well  as symptoms likely being secondary to a nonneurological cause.  Discussed with Dr. Shirlyn Goltz in the emergency room.  -- Amie Portland, MD [facility] Pager: [phone] If 7pm to 7am, please call on call as listed on AMION.

## 2019-03-21 NOTE — ED Notes (Signed)
Patient verbalizes understanding of discharge instructions. Opportunity for questioning and answers were provided. Pt discharged from ED. 

## 2019-03-21 NOTE — Telephone Encounter (Signed)
Claiborne Billings with [facility] is requesting verbal orders from Dr Diona Browner for the patient's Frequency  2 week 4 1 week 4   [facility] C/B # [phone]

## 2019-03-21 NOTE — ED Provider Notes (Addendum)
  Physical Exam  BP 130/76   Pulse 95   Temp 97.9 F (36.6 C) (Oral)   Resp 18   Ht 5\' 4"  (1.626 m)   Wt 127 kg   LMP 11/11/2011   SpO2 98%   BMI 48.06 kg/m   Physical Exam  ED Course/Procedures     Procedures  MDM  Care assumed at 4 pm. Patient here with L sided weakness. Code stroke was activated but deficits improved so no TPA was given. Sign out pending labs and neurology recommendations   5:13 PM Dr. Rory Percy called and recommend discharge. Thinks unlikely to have acute stroke.  Unclear if it is functional versus from hyperglycemia.  Her glucose was 400, improved to 308 after IVF. UDS + benzos. Appears slightly sleepy. Told her to avoid too much benzos and follow up with her endocrinologist. Stable for discharge      Drenda Freeze, MD 03/21/19 1714    Drenda Freeze, MD 03/21/19 [phone]

## 2019-03-21 NOTE — Discharge Instructions (Signed)
Your blood sugar was elevated. See your endocrinologist   Your salt level is slightly low. Please stay hydrated and you may add some salt to your food.   Follow up with your primary care doctor  Return to ER if you have worse weakness, trouble speaking.

## 2019-03-21 NOTE — ED Provider Notes (Signed)
[place] EMERGENCY DEPARTMENT Provider Note   CSN: [id number] Arrival date & time: 03/21/19  1351  An emergency department physician performed an initial assessment on this suspected stroke patient at 1355([place]).  History   Chief Complaint No chief complaint on file.   HPI [name] is a 48 y.o. female.     HPI Patient with history of multiple medical problems.  Patient reports that she had slurred speech and left-sided weakness.  She reports that the slurring of speech started 3 days ago.  She reports that today her left side was much weaker and she needed help from her husband to go to the bathroom. Past Medical History:  Diagnosis Date   Anxiety    takes Prozac daily   Anxiety    Aortic valve calcification    Asthma    Advair and Spirva daily   Asthma    Bipolar disorder (HCC)    CAD (coronary artery disease)    a. LHC 11/2013 done for CP/fluid retention: mild disease in prox LAD, mild-mod disease in mRCA, EF 60% with normal LVEDP. b. Normal nuc 03/2016.   CHF (congestive heart failure) (HCC)    Chronic diastolic CHF (congestive heart failure) (HCC)    CKD (chronic kidney disease), stage II    COPD (chronic obstructive pulmonary disease) (HCC)    a. nocturnal O2.   COPD (chronic obstructive pulmonary disease) (HCC)    Coronary artery disease    Decreased urine stream    Diabetes mellitus    Diabetes mellitus without complication (HCC)    Dyspnea    Family history of adverse reaction to anesthesia    mom gets nauseated   GERD (gastroesophageal reflux disease)    takes Pepcid daily   History of blood clots    left leg 3-23yrs ago   Hyperlipidemia    Hypertension    Hypertriglyceridemia    Inguinal hernia, left 01/2015   Muscle spasm    Open wound of genital labia    Peripheral neuropathy    RBBB    Seizures ([place])    Sepsis ([place]) 01/19/2018   Sinus tachycardia    a. persistent since 2009.   Smokers'  cough ([place])    Stroke [facility]) 1989   left sided weakness   TIA (transient ischemic attack)    Tobacco abuse    Vulvar abscess 01/23/2018    Patient Active Problem List   Diagnosis Date Noted   Chronic heart failure with preserved ejection fraction ([place]) 11/16/2018   Left-sided weakness 09/13/2018   Weakness of left lower extremity 09/05/2018   Recurrent falls 09/05/2018   PAD (peripheral artery disease) ([place]) 08/27/2018   Chronic congestive heart failure ([place]) 08/02/2018   Vaginal itching 06/15/2018   Foot pain, bilateral 05/22/2018   Chronic upper extremity pain (Secondary Area of Pain) (Bilateral) 04/23/2018   Diabetic polyneuropathy associated with type 1 diabetes mellitus ([place]) 04/23/2018   Long term prescription benzodiazepine use 04/23/2018   Neurogenic pain 04/23/2018   Vitamin D insufficiency 01/29/2018   Elevated C-reactive protein (CRP) 01/29/2018   Elevated sedimentation rate 01/29/2018   Pressure injury of skin 01/29/2018   Poorly controlled type 2 diabetes mellitus ([place]) 01/23/2018   DNR (do not resuscitate) 01/23/2018   IBS (irritable bowel syndrome) 01/19/2018   Elevated serum hCG 01/19/2018   Chronic lower extremity pain (Primary Area of Pain) (Bilateral) 01/08/2018   Chronic low back pain [facility] Area of Pain) (Bilateral) w/ sciatica (Bilateral) 01/08/2018   Chronic pain syndrome  01/08/2018   Pharmacologic therapy 01/08/2018   Disorder of skeletal system 01/08/2018   Problems influencing health status 01/08/2018   Long term current use of opiate analgesic 01/08/2018   Restless leg syndrome 08/02/2017   Nocturnal hypoxemia 03/29/2017   Vision disturbance 02/28/2017   CKD (chronic kidney disease), stage II 02/28/2017   Visual loss, bilateral    Chronic respiratory failure with hypoxia (HCC)/ nocturnal 02 dep  10/28/2016   Upper airway cough syndrome 08/22/2016   Cigarette smoker 06/15/2016   Simple chronic bronchitis  (HCC)    Coronary artery disease involving native heart without angina pectoris 05/16/2016   Acute on chronic diastolic (congestive) heart failure ([place]) 11/04/2015   Orthopnea 11/03/2015   Bilateral leg edema    Diabetes ([place])    COPD exacerbation ([place]) 10/15/2015   Fracture of rib, closed 10/15/2015   Venous stasis of both lower extremities 03/29/2015   Preventative health care 02/04/2015   Essential hypertension 01/02/2015   Inguinal hernia 11/27/2014   Allergic rhinitis with postnasal drip 01/21/2014   Aortic valve disorders 04/18/2013   Sleep apnea 05/22/2012   Seizure disorder ([place]) 10/09/2011   Bipolar disorder ([place]) 10/09/2011   TIA (transient ischemic attack) 06/22/2011   Constipation 06/15/2011   HYPERTRIGLYCERIDEMIA 07/17/2007   COPD GOLD 0  05/30/2007   Morbid (severe) obesity due to excess calories ([place]) 04/23/2007   Tobacco abuse 09/07/2006   Asthma, persistent 09/07/2006    Past Surgical History:  Procedure Laterality Date   HERNIA REPAIR     INCISION AND DRAINAGE ABSCESS N/A 01/26/2018   Procedure: INCISION AND DEBRIDEMENT OF VULVAR NECROTIZING SOFT TISSUE INFECTION;  Surgeon: Greer Pickerel, MD;  Location: [place];  Service: General;  Laterality: N/A;   INCISION AND DRAINAGE PERIRECTAL ABSCESS N/A 01/22/2018   Procedure: IRRIGATION AND DEBRIDEMENT LABIAL/VULVAR AREA;  Surgeon: Coralie Keens, MD;  Location: [place];  Service: General;  Laterality: N/A;   INCISION AND DRAINAGE PERIRECTAL ABSCESS N/A 01/29/2018   Procedure: IRRIGATION AND DEBRIDEMENT VULVA;  Surgeon: Excell Seltzer, MD;  Location: [place];  Service: General;  Laterality: N/A;   INGUINAL HERNIA REPAIR Left 04/08/2015   Procedure: OPEN LEFT INGUINAL HERNIA REPAIR WITH MESH;  Surgeon: Ralene Ok, MD;  Location: [place];  Service: General;  Laterality: Left;   INSERTION OF MESH Left 04/08/2015   Procedure: INSERTION OF MESH;  Surgeon: Ralene Ok, MD;  Location: [place];   Service: General;  Laterality: Left;   LAPAROSCOPY     Endometriosis   LEFT HEART CATHETERIZATION WITH CORONARY ANGIOGRAM N/A 12/05/2013   Procedure: LEFT HEART CATHETERIZATION WITH CORONARY ANGIOGRAM;  Surgeon: Jettie Booze, MD;  Location: [facility] C[facility] LAB;  Service: Cardiovascular;  Laterality: N/A;   right kidney drained     TEE WITHOUT CARDIOVERSION N/A 11/28/2013   Procedure: TRANSESOPHAGEAL ECHOCARDIOGRAM (TEE);  Surgeon: Thayer Headings, MD;  Location: [facility] ENDOSCOPY;  Service: Cardiovascular;  Laterality: N/A;     OB History   No obstetric history on file.      Home Medications    Prior to Admission medications   Medication Sig Start Date End Date Taking? Authorizing Provider  acetaminophen (TYLENOL) 500 MG tablet Take 500 mg by mouth every 4 (four) hours as needed for moderate pain.    Yes [provider]  albuterol (VENTOLIN HFA) 108 (90 Base) MCG/ACT inhaler INHALE 2 PUFFS BY MOUTH INTO THE LUNGS EVERY 4 HOURS AS NEEDED FOR SHORTNESS OFBREATH Patient taking differently: Inhale 2 puffs into the lungs every 4 (  four) hours as needed for shortness of breath.  03/08/19  Yes Pleas Koch, NP  ALPRAZolam Duanne Moron) 0.5 MG tablet Take 1 tablet (0.5 mg total) by mouth 2 (two) times daily as needed for anxiety. Patient taking differently: Take 1 mg by mouth daily.  09/14/18  Yes Geradine Girt, DO  aspirin EC 81 MG tablet Take 81 mg by mouth every morning.   Yes [provider]  budesonide (PULMICORT) 0.5 MG/2ML nebulizer solution Take 0.5 mg by nebulization 2 (two) times daily.   Yes [provider]  budesonide-formoterol (SYMBICORT) 80-4.5 MCG/ACT inhaler INHALE 2 PUFFS BY MOUTH EVERY 12 HOURS TO PREVENT COUGH OR WHEEZING *RINSE MOUTH AFTER USE* Patient taking differently: Inhale 2 puffs into the lungs every 12 (twelve) hours.  11/30/18  Yes Pleas Koch, NP  busPIRone (BUSPAR) 30 MG tablet Take 30 mg by mouth 2 (two) times daily.   Yes [provider]  famotidine (PEPCID) 20 MG tablet Take 20 mg by mouth at bedtime.   Yes [provider]  FLUoxetine (PROZAC) 40 MG capsule Take 40 mg by mouth at bedtime.  07/25/18  Yes [provider]  insulin regular human CONCENTRATED (HUMULIN R U-500 KWIKPEN) 500 UNIT/ML kwikpen Inject 250 Units into the skin 2 (two) times a day. Patient taking differently: Inject 180-230 Units into the skin 2 (two) times a day. Sliding scale 12/17/18  Yes Pleas Koch, NP  ipratropium-albuterol (DUONEB) 0.5-2.5 (3) MG/3ML SOLN Take 3 mLs by nebulization every 4 (four) hours as needed (wheezing/shortness of breath). 08/02/17  Yes Pleas Koch, NP  loperamide (IMODIUM A-D) 2 MG tablet Take 4 mg by mouth 3 (three) times daily as needed for diarrhea or loose stools.   Yes [provider]  metolazone (ZAROXOLYN) 2.5 MG tablet Take 2.5 mg by mouth at bedtime.   Yes [provider]  OXYGEN Inhale 4 L into the lungs at bedtime.    Yes [provider]  pregabalin (LYRICA) 300 MG capsule TAKE 1 CAPSULE BY MOUTH TWICE DAILY Patient taking differently: Take 300 mg by mouth daily.  01/30/19  Yes Pleas Koch, NP  rOPINIRole (REQUIP) 1 MG tablet TAKE 1 TABLET BY MOUTH EVERY NIGHT AT BEDTIME FOR RESTLESS LEGS. Patient taking differently: Take 1 mg by mouth daily.  01/01/19  Yes Pleas Koch, NP  spironolactone (ALDACTONE) 50 MG tablet TAKE 1 TABLET BY MOUTH ONCE A DAY Patient taking differently: Take 50 mg by mouth at bedtime.  03/19/19  Yes Patwardhan, Manish J, MD  tobramycin (TOBREX) 0.3 % ophthalmic solution Place 1 drop into both eyes daily.  10/26/18  Yes [provider]  torsemide (DEMADEX) 20 MG tablet Take 20 mg by mouth 2 (two) times daily.    Yes [provider]  traZODone (DESYREL) 100 MG tablet Take 100 mg by mouth daily.  11/08/18  Yes [provider]  glucose blood (ONETOUCH VERIO) test strip Use to monitor glucose levels  three times daily; E11.9; E10.42 09/26/18   Renato Shin, MD  Insulin Pen Needle 32G X 4 MM MISC Used to give insulin injections twice daily. 08/23/17   Renato Shin, MD  OneTouch Delica Lancets 99991111 MISC 1 each by Does not apply route 3 (three) times daily. Use to monitor glucose levels three times daily; E11.9; E10.42 09/26/18   Renato Shin, MD  ULTICARE MINI PEN NEEDLES 31G X 6 MM MISC USE AS DIRECTED THREE TIMES DAILY 07/25/18   Renato Shin, MD  Family History Family History  Problem Relation Age of Onset   Venous thrombosis Brother    Other Brother        BRAIN TUMOR   Asthma Father    Diabetes Father    Coronary artery disease Mother    Hypertension Mother    Diabetes Mother    Asthma Sister    Diabetes type II Brother     Social History Social History   Tobacco Use   Smoking status: Current Some Day Smoker    Packs/day: 0.50    Years: 36.00    Pack years: 18.00    Types: Cigarettes    Last attempt to quit: 01/18/2018    Years since quitting: 1.1   Smokeless tobacco: Never Used  Substance Use Topics   Alcohol use: No    Frequency: Never   Drug use: No     Allergies   Adhesive [tape], Metoprolol, Montelukast, Morphine sulfate, Penicillins, Prednisone, Diltiazem, and Gabapentin   Review of Systems Review of Systems 10 Systems reviewed and are negative for acute change except as noted in the HPI.   Physical Exam Updated Vital Signs BP 102/69    Pulse 81    Temp 97.9 F (36.6 C) (Oral)    Resp 14    Ht 5\' 4"  (1.626 m)    Wt 127 kg    LMP 11/11/2011    SpO2 94%    BMI 48.06 kg/m   Physical Exam Constitutional:      Appearance: She is well-developed.     Comments: Patient is awake and alert.  She does have a slightly sedated quality to her demeanor.  She is not lethargic.  No respiratory distress.  HENT:     Head: Normocephalic and atraumatic.     Mouth/Throat:     Mouth: Mucous membranes are moist.     Pharynx: Oropharynx is clear.    Eyes:     Extraocular Movements: Extraocular movements intact.     Pupils: Pupils are equal, round, and reactive to light.  Neck:     Musculoskeletal: Neck supple.  Cardiovascular:     Rate and Rhythm: Normal rate and regular rhythm.     Heart sounds: Normal heart sounds.  Pulmonary:     Effort: Pulmonary effort is normal.     Breath sounds: Normal breath sounds.  Abdominal:     General: Bowel sounds are normal. There is no distension.     Palpations: Abdomen is soft.     Tenderness: There is no abdominal tenderness.  Musculoskeletal: Normal range of motion.  Skin:    General: Skin is warm and dry.  Neurological:     Mental Status: She is alert and oriented to person, place, and time.     GCS: GCS eye subscore is 4. GCS verbal subscore is 5. GCS motor subscore is 6.     Coordination: Coordination normal.     Comments: Patient answers questions appropriately.  She does have decreased grip strength on the left 3\5 compared to the right.  She reports that she cannot elevate her left lower extremity off of the bed.  She can flex and extend at the foot.      ED Treatments / Results  Labs (all labs ordered are listed, but only abnormal results are displayed) Labs Reviewed  CBC - Abnormal; Notable for the following components:      Result Value   RDW 17.3 (*)    All other components within normal limits  COMPREHENSIVE  METABOLIC PANEL - Abnormal; Notable for the following components:   Sodium 130 (*)    Chloride 89 (*)    Glucose, Bld 434 (*)    BUN 36 (*)    Creatinine, Ser 1.47 (*)    Albumin 3.4 (*)    ALT 45 (*)    GFR calc non Af Amer 41 (*)    GFR calc Af Amer 48 (*)    All other components within normal limits  AMMONIA - Abnormal; Notable for the following components:   Ammonia 41 (*)    All other components within normal limits  I-STAT CHEM 8, ED - Abnormal; Notable for the following components:   Sodium 131 (*)    Chloride 92 (*)    BUN 37 (*)    Creatinine,  Ser 1.30 (*)    Glucose, Bld 429 (*)    Calcium, Ion 1.08 (*)    All other components within normal limits  I-STAT BETA HCG BLOOD, ED (MC, WL, AP ONLY) - Abnormal; Notable for the following components:   I-stat hCG, quantitative 6.4 (*)    All other components within normal limits  POCT I-STAT 7, (LYTES, BLD GAS, ICA,H+H) - Abnormal; Notable for the following components:   pCO2 arterial 49.3 (*)    pO2, Arterial 56.0 (*)    Bicarbonate 30.6 (*)    Acid-Base Excess 5.0 (*)    Sodium 133 (*)    All other components within normal limits  POCT I-STAT EG7 - Abnormal; Notable for the following components:   pH, Ven 7.459 (*)    pO2, Ven 48.0 (*)    Bicarbonate 32.9 (*)    TCO2 34 (*)    Acid-Base Excess 8.0 (*)    Sodium 133 (*)    Calcium, Ion 1.14 (*)    All other components within normal limits  PROTIME-INR  APTT  DIFFERENTIAL  TSH  URINALYSIS, ROUTINE W REFLEX MICROSCOPIC  BLOOD GAS, VENOUS  RAPID URINE DRUG SCREEN, HOSP PERFORMED    EKG None  Radiology Dg Chest Port 1 View  Result Date: 03/21/2019 CLINICAL DATA:  Slurred speech, left-sided weakness EXAM: PORTABLE CHEST 1 VIEW COMPARISON:  09/13/2018 FINDINGS: Cardiomegaly. Both lungs are clear. The visualized skeletal structures are unremarkable. IMPRESSION: Cardiomegaly without acute abnormality of the lungs in AP portable projection. Electronically Signed   By: Eddie Candle M.D.   On: 03/21/2019 15:02   Ct Head Code Stroke Wo Contrast  Result Date: 03/21/2019 CLINICAL DATA:  Code stroke.  Left-sided deficit.  Slurred speech. EXAM: CT HEAD WITHOUT CONTRAST TECHNIQUE: Contiguous axial images were obtained from the base of the skull through the vertex without intravenous contrast. COMPARISON:  CT head 02/27/2019 FINDINGS: Brain: Ventricle size and cerebral volume normal. Negative for infarct, hemorrhage, mass. No change from prior. Vascular: Negative for hyperdense vessel Skull: Negative Sinuses/Orbits: Negative Other:  Spherical metal foreign body in the left frontal scalp approximately 6 mm in diameter unchanged from prior study. ASPECTS [facility] Stroke Program Early CT Score) - Ganglionic level infarction (caudate, lentiform nuclei, internal capsule, insula, M1-M3 cortex): 7 - Supraganglionic infarction (M4-M6 cortex): 3 Total score (0-10 with 10 being normal): 10 IMPRESSION: 1. Negative CT head 2. ASPECTS is 10 3. These results were called by telephone at the time of interpretation on 03/21/2019 at 2:09 pm to provider [facility] , who verbally acknowledged these results. Electronically Signed   By: Franchot Gallo M.D.   On: 03/21/2019 14:09    Procedures Procedures (including critical care time)  Medications Ordered in ED Medications  sodium chloride flush (NS) 0.9 % injection 3 mL (has no administration in time range)  acetaminophen (TYLENOL) tablet 1,000 mg (has no administration in time range)  albuterol (VENTOLIN HFA) 108 (90 Base) MCG/ACT inhaler 2 puff (has no administration in time range)     Initial Impression / Assessment and Plan / ED Course  I have reviewed the triage vital signs and the nursing notes.  Pertinent labs & imaging results that were available during my care of the patient were reviewed by me and considered in my medical decision making (see chart for details).       Patient is reporting slurred speech and left-sided weakness.  She did have some pre-existing old stroke with intermittent similar symptoms.  At this time she is having evaluation by neurology.  Neurology recommendations for observation admission.  Pending results, Dr. Darl Householder will reassess for admission versus discharge her AMA.  Patient had earlier stated to me that "just wanted to find out what was wrong and then go home".  Final Clinical Impressions(s) / ED Diagnoses   Final diagnoses:  Left-sided weakness    ED Discharge Orders    None       Charlesetta Shanks, MD 03/21/19 [phone]

## 2019-03-22 NOTE — Telephone Encounter (Signed)
Left message for Claiborne Billings with [facility] giving verbal orders for PT 2 x a week for 4 weeks, then 1 x wee for 4 weeks.

## 2019-03-22 NOTE — Telephone Encounter (Signed)
Agreed -

## 2019-03-25 ENCOUNTER — Ambulatory Visit: Payer: Medicare Other | Admitting: Neurology

## 2019-03-26 ENCOUNTER — Encounter: Payer: Self-pay | Admitting: Neurology

## 2019-04-03 DIAGNOSIS — F410 Panic disorder [episodic paroxysmal anxiety] without agoraphobia: Secondary | ICD-10-CM | POA: Diagnosis not present

## 2019-04-03 DIAGNOSIS — F4321 Adjustment disorder with depressed mood: Secondary | ICD-10-CM | POA: Diagnosis not present

## 2019-04-03 DIAGNOSIS — F3342 Major depressive disorder, recurrent, in full remission: Secondary | ICD-10-CM | POA: Diagnosis not present

## 2019-04-04 ENCOUNTER — Telehealth: Payer: Self-pay | Admitting: Primary Care

## 2019-04-04 DIAGNOSIS — E1165 Type 2 diabetes mellitus with hyperglycemia: Secondary | ICD-10-CM

## 2019-04-04 NOTE — Telephone Encounter (Signed)
Message left for patient to return my call.  

## 2019-04-04 NOTE — Telephone Encounter (Signed)
Please clarify with patient that she would like to transfer care from Dr. Loanne Drilling to Dr. Gabriel Carina? Thanks.

## 2019-04-04 NOTE — Telephone Encounter (Signed)
Best number [phone]  Pt stated [facility] recommended pt see Dr Lucilla Lame for her dm.  Pt needs referral   Fax number [phone]  Phone [phone]

## 2019-04-05 NOTE — Telephone Encounter (Signed)
Noted, referral placed. Please notify patient that it make take several months to get in so she should follow up with Dr. Loanne Drilling until she gets established.

## 2019-04-05 NOTE — Telephone Encounter (Signed)
Spoken and notified patient of [name] Clark's comments. Patient verbalized understanding.  

## 2019-04-05 NOTE — Telephone Encounter (Signed)
Spoken to patient and she stated that she feels like she need someone that may be for fitting, better communication, and she is just not happy

## 2019-04-22 DIAGNOSIS — J45909 Unspecified asthma, uncomplicated: Secondary | ICD-10-CM

## 2019-04-22 DIAGNOSIS — Z9181 History of falling: Secondary | ICD-10-CM

## 2019-04-22 DIAGNOSIS — M549 Dorsalgia, unspecified: Secondary | ICD-10-CM

## 2019-04-22 DIAGNOSIS — I5032 Chronic diastolic (congestive) heart failure: Secondary | ICD-10-CM

## 2019-04-22 DIAGNOSIS — J449 Chronic obstructive pulmonary disease, unspecified: Secondary | ICD-10-CM | POA: Diagnosis not present

## 2019-04-22 DIAGNOSIS — G40909 Epilepsy, unspecified, not intractable, without status epilepticus: Secondary | ICD-10-CM | POA: Diagnosis not present

## 2019-04-22 DIAGNOSIS — I130 Hypertensive heart and chronic kidney disease with heart failure and stage 1 through stage 4 chronic kidney disease, or unspecified chronic kidney disease: Secondary | ICD-10-CM | POA: Diagnosis not present

## 2019-04-22 DIAGNOSIS — N182 Chronic kidney disease, stage 2 (mild): Secondary | ICD-10-CM

## 2019-04-22 DIAGNOSIS — E1122 Type 2 diabetes mellitus with diabetic chronic kidney disease: Secondary | ICD-10-CM

## 2019-04-22 DIAGNOSIS — E1142 Type 2 diabetes mellitus with diabetic polyneuropathy: Secondary | ICD-10-CM

## 2019-04-22 DIAGNOSIS — E785 Hyperlipidemia, unspecified: Secondary | ICD-10-CM

## 2019-04-22 DIAGNOSIS — G8929 Other chronic pain: Secondary | ICD-10-CM

## 2019-04-22 DIAGNOSIS — K219 Gastro-esophageal reflux disease without esophagitis: Secondary | ICD-10-CM

## 2019-04-22 DIAGNOSIS — Z794 Long term (current) use of insulin: Secondary | ICD-10-CM

## 2019-04-22 DIAGNOSIS — F17210 Nicotine dependence, cigarettes, uncomplicated: Secondary | ICD-10-CM

## 2019-04-22 DIAGNOSIS — F319 Bipolar disorder, unspecified: Secondary | ICD-10-CM

## 2019-04-22 DIAGNOSIS — F419 Anxiety disorder, unspecified: Secondary | ICD-10-CM

## 2019-04-22 DIAGNOSIS — I69354 Hemiplegia and hemiparesis following cerebral infarction affecting left non-dominant side: Secondary | ICD-10-CM | POA: Diagnosis not present

## 2019-04-22 DIAGNOSIS — I2510 Atherosclerotic heart disease of native coronary artery without angina pectoris: Secondary | ICD-10-CM

## 2019-05-06 ENCOUNTER — Other Ambulatory Visit: Payer: Self-pay

## 2019-05-06 ENCOUNTER — Ambulatory Visit (INDEPENDENT_AMBULATORY_CARE_PROVIDER_SITE_OTHER): Payer: BC Managed Care – PPO | Admitting: Podiatry

## 2019-05-06 ENCOUNTER — Encounter: Payer: Self-pay | Admitting: Podiatry

## 2019-05-06 ENCOUNTER — Other Ambulatory Visit: Payer: Self-pay | Admitting: Primary Care

## 2019-05-06 DIAGNOSIS — M79675 Pain in left toe(s): Secondary | ICD-10-CM

## 2019-05-06 DIAGNOSIS — M79674 Pain in right toe(s): Secondary | ICD-10-CM

## 2019-05-06 DIAGNOSIS — J449 Chronic obstructive pulmonary disease, unspecified: Secondary | ICD-10-CM

## 2019-05-06 DIAGNOSIS — E1159 Type 2 diabetes mellitus with other circulatory complications: Secondary | ICD-10-CM

## 2019-05-06 DIAGNOSIS — E1142 Type 2 diabetes mellitus with diabetic polyneuropathy: Secondary | ICD-10-CM

## 2019-05-06 DIAGNOSIS — B351 Tinea unguium: Secondary | ICD-10-CM | POA: Diagnosis not present

## 2019-05-06 DIAGNOSIS — M2010 Hallux valgus (acquired), unspecified foot: Secondary | ICD-10-CM

## 2019-05-06 NOTE — Progress Notes (Signed)
Complaint:  Visit Type: Patient returns to my office for continued preventative foot care services. Complaint: Patient states" my nails have grown long and thick and become painful to walk and wear shoes" Patient has been diagnosed with DM with neuropathy. The patient presents for preventative foot care services. No changes to ROS  Podiatric Exam: Vascular: dorsalis pedis and posterior tibial pulses are weakly  palpable bilateral. Capillary return is immediate. Temperature gradient is WNL. Skin turgor WNL  Sensorium: Absent  Semmes Weinstein monofilament test. Normal tactile sensation bilaterally. Nail Exam: Pt has thick disfigured discolored nails with subungual debris noted bilateral entire nail hallux through fifth toenails Ulcer Exam: There is no evidence of ulcer or pre-ulcerative changes or infection. Orthopedic Exam: Muscle tone and strength are WNL. No limitations in general ROM. No crepitus or effusions noted. Foot type and digits show no abnormalities. Mild  HAV  B/L. Skin: No Porokeratosis. No infection or ulcers  Diagnosis:  Onychomycosis, , Pain in right toe, pain in left toes,  Diabetes with angiopathy and neuropathy  Treatment & Plan Procedures and Treatment: Consent by patient was obtained for treatment procedures.   Debridement of mycotic and hypertrophic toenails, 1 through 5 bilateral and clearing of subungual debris. No ulceration, no infection noted. Will check on shoe status. Return Visit-Office Procedure: Patient instructed to return to the office for a follow up visit 3 months for continued evaluation and treatment.    Gardiner Barefoot DPM

## 2019-05-07 IMAGING — DX DG CHEST 2V
2 series · 2 of 2 positions shown · non-contrast
Comparison: 01/19/2018

CLINICAL DATA: Cough and shortness of breath.  Smoker.

EXAM:
CHEST - 2 VIEW

[chest pa]
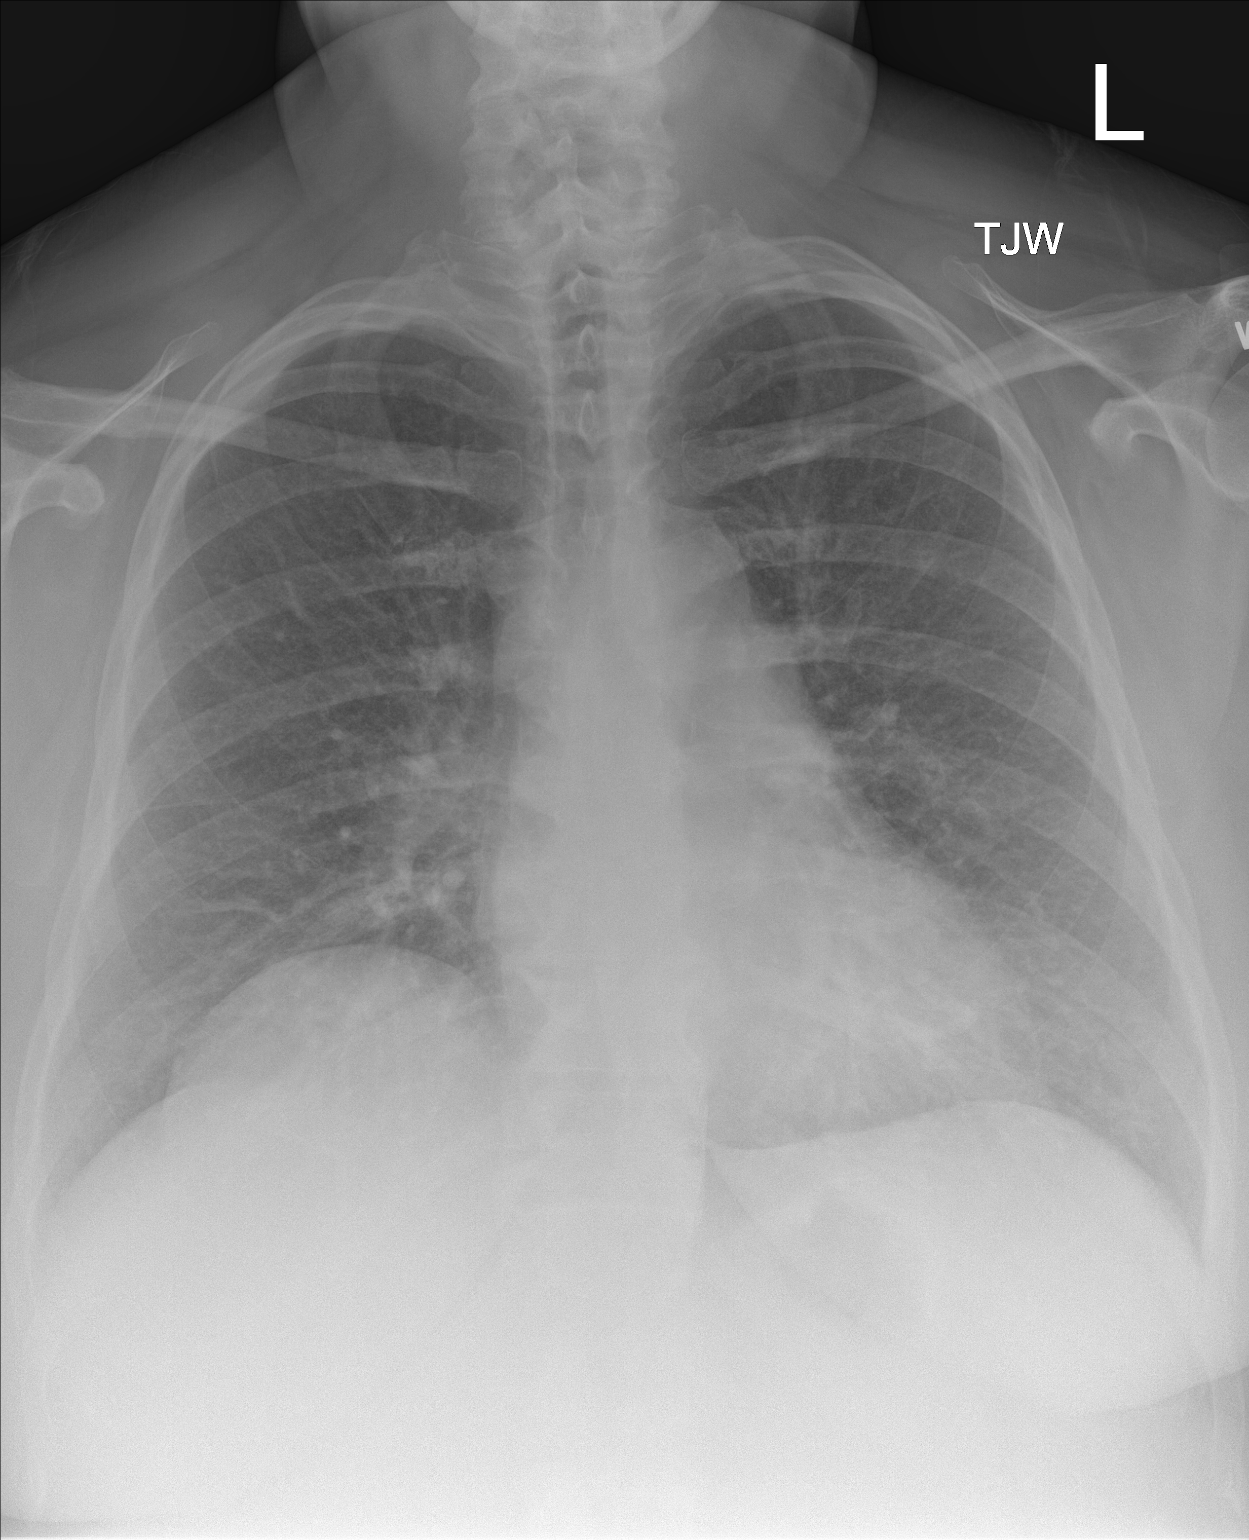

[chest ap]
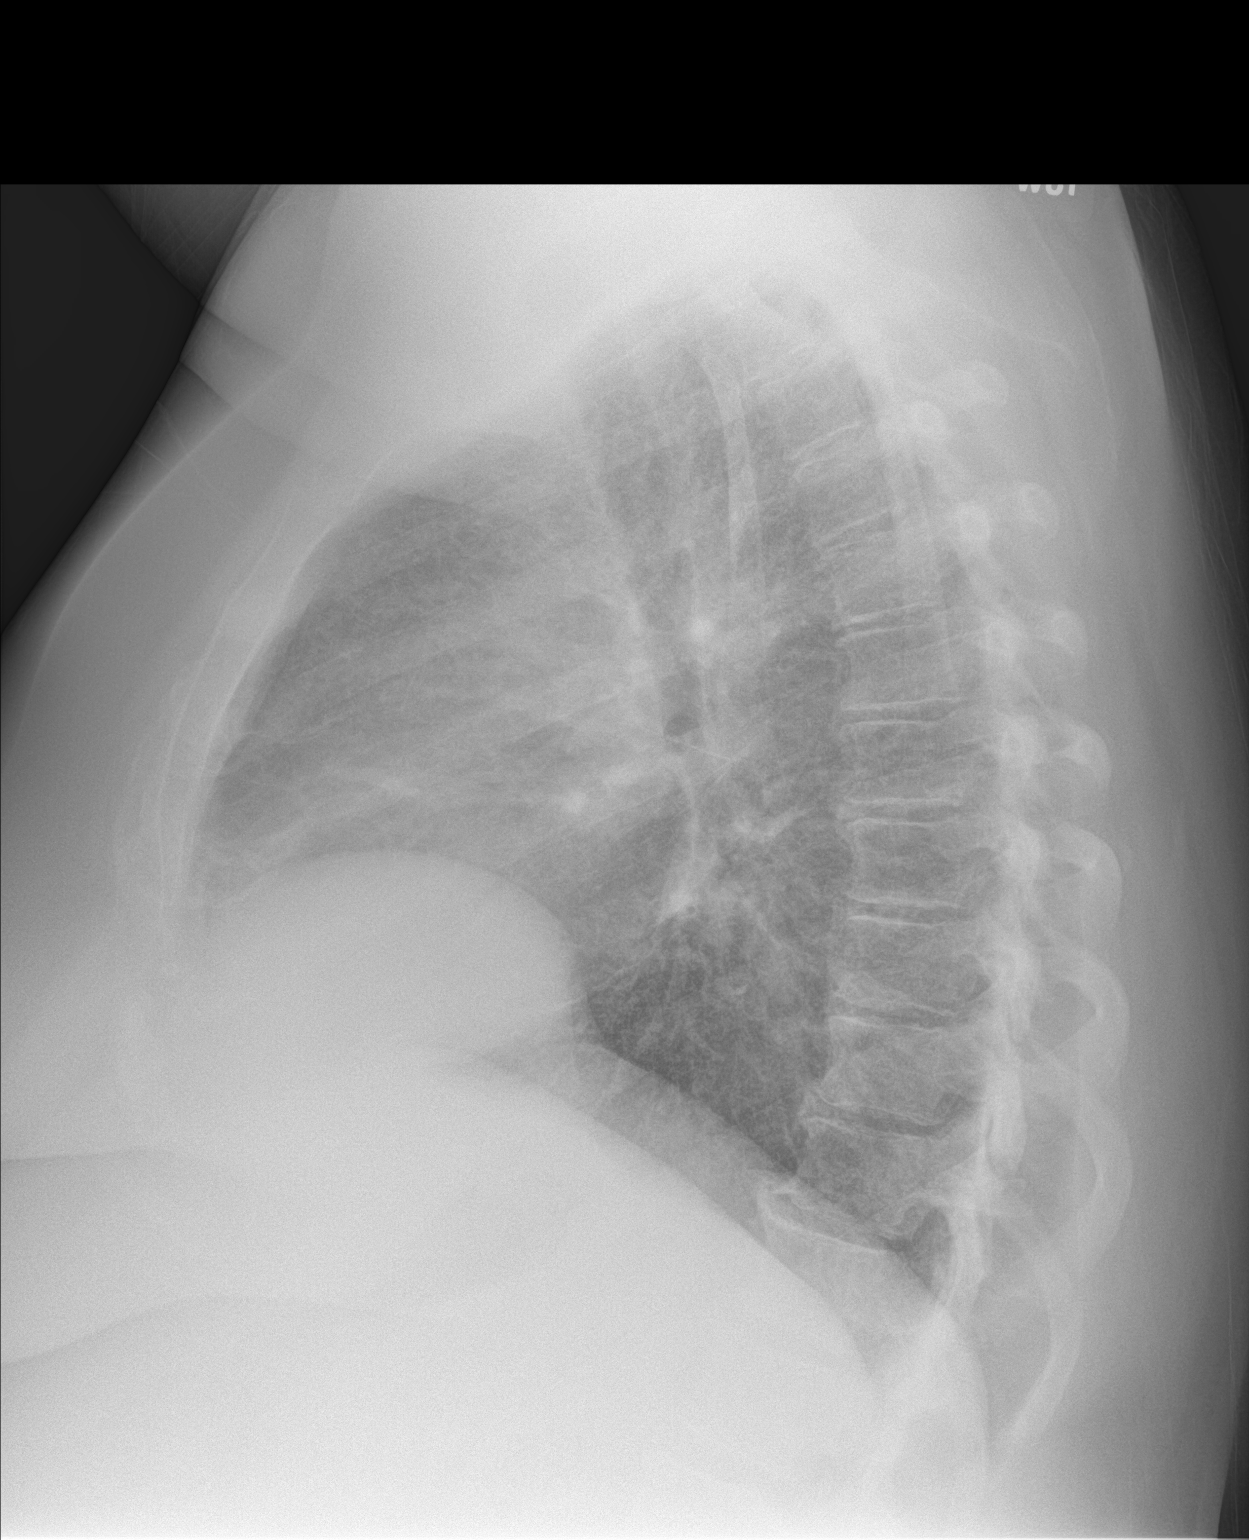

[2 of 2 positions shown; findings below may reference images not displayed]

FINDINGS: The heart size and mediastinal contours are within normal limits.
Eventration of right hemidiaphragm is stable. Mild scarring again
seen in the right middle lobe. No evidence of pulmonary infiltrate
or edema. No evidence of effusion. The visualized skeletal
structures are unremarkable.
IMPRESSION: Stable right middle lobe scarring and eventration of right
hemidiaphragm. No active cardiopulmonary disease.

## 2019-05-14 ENCOUNTER — Telehealth: Payer: Self-pay | Admitting: Internal Medicine

## 2019-05-14 NOTE — Telephone Encounter (Signed)
Received a letter from [facility], stating that pt is due for walk test and OV for oxygen. Pt last last seen 12/21/17. Pt has been scheduled for 06/12/2019 with Dr. Mortimer Fries, as she preferred AM appt. This was first available, as our NP only come in the afternoons.  Nothing further is needed.

## 2019-06-03 ENCOUNTER — Telehealth: Payer: Self-pay | Admitting: Primary Care

## 2019-06-03 ENCOUNTER — Other Ambulatory Visit: Payer: Self-pay | Admitting: Primary Care

## 2019-06-03 DIAGNOSIS — M792 Neuralgia and neuritis, unspecified: Secondary | ICD-10-CM

## 2019-06-03 DIAGNOSIS — J449 Chronic obstructive pulmonary disease, unspecified: Secondary | ICD-10-CM

## 2019-06-03 DIAGNOSIS — E1042 Type 1 diabetes mellitus with diabetic polyneuropathy: Secondary | ICD-10-CM

## 2019-06-03 DIAGNOSIS — G629 Polyneuropathy, unspecified: Secondary | ICD-10-CM

## 2019-06-03 NOTE — Telephone Encounter (Signed)
Olivia Mackie with [facility] called today while at the patient's home She stated the patient needs either an antibiotic or virtual visit due to not having transportation   Olivia Mackie stated that the patient fell last Friday and has a wound on her left left .  It is located on her left shin. Wound is approximately 40x20cm. And its an opened wound. There is no skin covering. She stated that it is really red and draining. Drainage does have a yellow tint to it but no odor. Area is very tender to the touch.   Olivia Mackie stated the patient also has wounds on her right fingers that she has had a couple weeks. Patient was cleaning with Clorox so areas are very red and swollen. She also noticed there are some blisters. And area is also very tender to touch  Patient has no fever. And BS was 430 this am  Patient does not have transportation to come into the office.  Would you like a virtual scheduled for the patient ??  If you have any more questions tracy can be reached @ [phone]

## 2019-06-03 NOTE — Telephone Encounter (Signed)
Please set up for a virtual visit. Thanks.

## 2019-06-04 ENCOUNTER — Other Ambulatory Visit: Payer: Self-pay

## 2019-06-04 ENCOUNTER — Ambulatory Visit (INDEPENDENT_AMBULATORY_CARE_PROVIDER_SITE_OTHER): Payer: BC Managed Care – PPO | Admitting: Primary Care

## 2019-06-04 ENCOUNTER — Ambulatory Visit (INDEPENDENT_AMBULATORY_CARE_PROVIDER_SITE_OTHER)
Admission: RE | Admit: 2019-06-04 | Discharge: 2019-06-04 | Disposition: A | Discharge status: Home / Self Care | Payer: BC Managed Care – PPO | Source: Ambulatory Visit | Attending: Primary Care | Admitting: Primary Care

## 2019-06-04 ENCOUNTER — Encounter: Payer: Self-pay | Admitting: Primary Care

## 2019-06-04 VITALS — BP 126/76 | HR 90 | Temp 97.9°F | Ht 64.0 in | Wt 283.5 lb

## 2019-06-04 DIAGNOSIS — L03116 Cellulitis of left lower limb: Secondary | ICD-10-CM | POA: Diagnosis not present

## 2019-06-04 DIAGNOSIS — S61200A Unspecified open wound of right index finger without damage to nail, initial encounter: Secondary | ICD-10-CM | POA: Diagnosis not present

## 2019-06-04 DIAGNOSIS — T148XXA Other injury of unspecified body region, initial encounter: Secondary | ICD-10-CM | POA: Diagnosis not present

## 2019-06-04 DIAGNOSIS — I6521 Occlusion and stenosis of right carotid artery: Secondary | ICD-10-CM

## 2019-06-04 DIAGNOSIS — S61202A Unspecified open wound of right middle finger without damage to nail, initial encounter: Secondary | ICD-10-CM | POA: Diagnosis not present

## 2019-06-04 HISTORY — DX: Cellulitis of left lower limb: L03.116

## 2019-06-04 LAB — CBC WITH DIFFERENTIAL/PLATELET
Basophils Absolute: 0.1 10*3/uL (ref 0.0–0.1)
Basophils Relative: 0.4 % (ref 0.0–3.0)
Eosinophils Absolute: 0.3 10*3/uL (ref 0.0–0.7)
Eosinophils Relative: 2.2 % (ref 0.0–5.0)
HCT: 39.2 % (ref 36.0–46.0)
Hemoglobin: 13.2 g/dL (ref 12.0–15.0)
Lymphocytes Relative: 10.9 % — ABNORMAL LOW (ref 12.0–46.0)
Lymphs Abs: 1.3 10*3/uL (ref 0.7–4.0)
MCHC: 33.6 g/dL (ref 30.0–36.0)
MCV: 88.6 fl (ref 78.0–100.0)
Monocytes Absolute: 0.8 10*3/uL (ref 0.1–1.0)
Monocytes Relative: 6.2 % (ref 3.0–12.0)
Neutro Abs: 9.9 10*3/uL — ABNORMAL HIGH (ref 1.4–7.7)
Neutrophils Relative %: 80.3 % — ABNORMAL HIGH (ref 43.0–77.0)
Platelets: 196 10*3/uL (ref 150.0–400.0)
RBC: 4.43 Mil/uL (ref 3.87–5.11)
RDW: 16.3 % — ABNORMAL HIGH (ref 11.5–15.5)
WBC: 12.3 10*3/uL — ABNORMAL HIGH (ref 4.0–10.5)

## 2019-06-04 MED ORDER — SULFAMETHOXAZOLE-TRIMETHOPRIM 800-160 MG PO TABS: 1.0000 | ORAL_TABLET | Freq: Two times a day (BID) | ORAL | 0 refills | Status: DC

## 2019-06-04 NOTE — Patient Instructions (Addendum)
Complete xray(s) prior to leaving today. I will notify you of your results once received.  Start Bactrim DS (sulfamethoxazole/trimethoprim) tablets for urinary tract infection. Take 1 tablet by mouth twice daily for 10 days.  Stop by the front desk and speak with either Rosaria Ferries or Charmaine regarding your referral to the wound clinic.  It was a pleasure to see you today!

## 2019-06-04 NOTE — Telephone Encounter (Signed)
Does she have heartburn?  Does she feel like she needs Pepcid nightly for heartburn? If so then okay to refill.

## 2019-06-04 NOTE — Progress Notes (Signed)
Subjective:    Patient ID: [name], female    DOB: 1971-03-04, 48 y.o.   MRN: [id number]  HPI  [name] is a 48 year old female with a complex medical history including TIA, hypertension, type 2 diabetes, CAD, PAD, CHF, COPD, sleep apnea, tobacco abuse, chronic respiratory failure who presents today with a chief complaint of wound.  Her wound is located to the anterior left lower extremity which occurred one week ago. She was climbing up into her outdoor building when her left lower extremity "gave out" and she slowly scrapped the anterior part of her left lower extremity. She's been using peroxide and neosporin up until yesterday. The home health nurse came out yesterday who recommended a visit with PCP. She was told that she needed a wound care consult.   She also has blisters to the right 2nd and 3rd digits which occurred on 05/21/19. She was attempting to remove her nail polish when her son gave her a mixture of Clorox, vinegar, and water which she got onto the skin of knuckles of her 2nd and 3rd digits. Since then her knuckles of her hands have blistered and popped. Now has partial thickness openings to the sites for which she's covered with bandaids.   Review of Systems  Constitutional: Negative for fever.  Skin: Positive for color change and wound.       Past Medical History:  Diagnosis Date  . Anxiety    takes Prozac daily  . Anxiety   . Aortic valve calcification   . Asthma    Advair and Spirva daily  . Asthma   . Bipolar disorder ([place])   . CAD (coronary artery disease)    a. LHC 11/2013 done for CP/fluid retention: mild disease in prox LAD, mild-mod disease in mRCA, EF 60% with normal LVEDP. b. Normal nuc 03/2016.  Marland Kitchen CHF (congestive heart failure) ([place])   . Chronic diastolic CHF (congestive heart failure) ([place])   . CKD (chronic kidney disease), stage II   . COPD (chronic obstructive pulmonary disease) ([facility])    a. nocturnal O2.  Marland Kitchen COPD (chronic obstructive  pulmonary disease) ([place])   . Coronary artery disease   . Decreased urine stream   . Diabetes mellitus   . Diabetes mellitus without complication ([place])   . Dyspnea   . Family history of adverse reaction to anesthesia    mom gets nauseated  . GERD (gastroesophageal reflux disease)    takes Pepcid daily  . History of blood clots    left leg 3-60yrs ago  . Hyperlipidemia   . Hypertension   . Hypertriglyceridemia   . Inguinal hernia, left 01/2015  . Muscle spasm   . Open wound of genital labia   . Peripheral neuropathy   . RBBB   . Seizures ([place])   . Sepsis ([place]) 01/19/2018  . Sinus tachycardia    a. persistent since 2009.  Marland Kitchen Smokers' cough ([place])   . Stroke [facility]) 1989   left sided weakness  . TIA (transient ischemic attack)   . Tobacco abuse   . Vulvar abscess 01/23/2018     Social History   Socioeconomic History  . Marital status: Married    Spouse name: Not on file  . Number of children: 2  . Years of education: Not on file  . Highest education level: Not on file  Occupational History  . Not on file  Social Needs  . Financial resource strain: Not on file  . Food insecurity  Worry: Not on file    Inability: Not on file  . Transportation needs    Medical: Not on file    Non-medical: Not on file  Tobacco Use  . Smoking status: Current Some Day Smoker    Packs/day: 0.50    Years: 36.00    Pack years: 18.00    Types: Cigarettes    Last attempt to quit: 01/18/2018    Years since quitting: 1.3  . Smokeless tobacco: Never Used  Substance and Sexual Activity  . Alcohol use: No    Frequency: Never  . Drug use: No  . Sexual activity: Not Currently  Lifestyle  . Physical activity    Days per week: Patient refused    Minutes per session: Patient refused  . Stress: Only a little  Relationships  . Social connections    Talks on phone: More than three times a week    Gets together: Patient refused    Attends religious service: Patient refused    Active member of  club or organization: Patient refused    Attends meetings of clubs or organizations: Patient refused    Relationship status: Married  . Intimate partner violence    Fear of current or ex partner: Patient refused    Emotionally abused: Patient refused    Physically abused: Patient refused    Forced sexual activity: Patient refused  Other Topics Concern  . Not on file  Social History Narrative   ** Merged History Encounter **       Lives in [place]   Married but lives with Boyfriend - not legally separated   Disabled - for BiPolar, Seizure disorder, diabetes   Formerly worked at [employer]          Past Surgical History:  Procedure Laterality Date  . HERNIA REPAIR    . INCISION AND DRAINAGE ABSCESS N/A 01/26/2018   Procedure: INCISION AND DEBRIDEMENT OF VULVAR NECROTIZING SOFT TISSUE INFECTION;  Surgeon: Greer Pickerel, MD;  Location: [place];  Service: General;  Laterality: N/A;  . INCISION AND DRAINAGE PERIRECTAL ABSCESS N/A 01/22/2018   Procedure: IRRIGATION AND DEBRIDEMENT LABIAL/VULVAR AREA;  Surgeon: Coralie Keens, MD;  Location: [place];  Service: General;  Laterality: N/A;  . INCISION AND DRAINAGE PERIRECTAL ABSCESS N/A 01/29/2018   Procedure: IRRIGATION AND DEBRIDEMENT VULVA;  Surgeon: Excell Seltzer, MD;  Location: [place];  Service: General;  Laterality: N/A;  . INGUINAL HERNIA REPAIR Left 04/08/2015   Procedure: OPEN LEFT INGUINAL HERNIA REPAIR WITH MESH;  Surgeon: Ralene Ok, MD;  Location: [place];  Service: General;  Laterality: Left;  . INSERTION OF MESH Left 04/08/2015   Procedure: INSERTION OF MESH;  Surgeon: Ralene Ok, MD;  Location: [place];  Service: General;  Laterality: Left;  . LAPAROSCOPY     Endometriosis  . LEFT HEART CATHETERIZATION WITH CORONARY ANGIOGRAM N/A 12/05/2013   Procedure: LEFT HEART CATHETERIZATION WITH CORONARY ANGIOGRAM;  Surgeon: Jettie Booze, MD;  Location: [facility] CATH LAB;  Service: Cardiovascular;  Laterality: N/A;  . right kidney  drained    . TEE WITHOUT CARDIOVERSION N/A 11/28/2013   Procedure: TRANSESOPHAGEAL ECHOCARDIOGRAM (TEE);  Surgeon: Thayer Headings, MD;  Location: [facility] ENDOSCOPY;  Service: Cardiovascular;  Laterality: N/A;    Family History  Problem Relation Age of Onset  . Venous thrombosis Brother   . Other Brother        BRAIN TUMOR  . Asthma Father   . Diabetes Father   . Coronary artery disease Mother   .  Hypertension Mother   . Diabetes Mother   . Asthma Sister   . Diabetes type II Brother     Allergies  Allergen Reactions  . Adhesive [Tape] Rash and Other (See Comments)    TAKES OFF THE SKIN (CERTAIN MEDICAL TAPES DO THIS!!)  . Metoprolol Shortness Of Breath    Occurrence of shortness of breath after 3 days  . Montelukast Shortness Of Breath  . Morphine Sulfate Anaphylaxis, Shortness Of Breath and Nausea And Vomiting    Swollen Throat - Able to tolerate dilaudid  . Penicillins Anaphylaxis, Hives and Shortness Of Breath    Throat swells Has patient had a PCN reaction causing immediate rash, facial/tongue/throat swelling, SOB or lightheadedness with hypotension: Yes Has patient had a PCN reaction causing severe rash involving mucus membranes or skin necrosis: No Has patient had a PCN reaction that required hospitalization: Yes Has patient had a PCN reaction occurring within the last 10 years: No If all of the above answers are "NO", then may proceed with Cephalosporin use.   . Prednisone Anaphylaxis  . Diltiazem Swelling  . Gabapentin Swelling    Current Outpatient Medications on File Prior to Visit  Medication Sig Dispense Refill  . acetaminophen (TYLENOL) 500 MG tablet Take 500 mg by mouth every 4 (four) hours as needed for moderate pain.     Marland Kitchen ALPRAZolam (XANAX) 0.5 MG tablet Take 1 tablet (0.5 mg total) by mouth 2 (two) times daily as needed for anxiety. (Patient taking differently: Take 1 mg by mouth daily. )  0  . aspirin EC 81 MG tablet Take 81 mg by mouth every morning.    .  budesonide (PULMICORT) 0.5 MG/2ML nebulizer solution Take 0.5 mg by nebulization 2 (two) times daily.    . budesonide-formoterol (SYMBICORT) 80-4.5 MCG/ACT inhaler INHALE 2 PUFFS BY MOUTH EVERY 12 HOURS TO PREVENT COUGH OR WHEEZING *RINSE MOUTH AFTER USE* (Patient taking differently: Inhale 2 puffs into the lungs every 12 (twelve) hours. ) 10.2 g 2  . busPIRone (BUSPAR) 30 MG tablet Take 30 mg by mouth 2 (two) times daily.    . famotidine (PEPCID) 20 MG tablet Take 20 mg by mouth at bedtime.    Marland Kitchen FLUoxetine (PROZAC) 40 MG capsule Take 40 mg by mouth at bedtime.     Marland Kitchen glucose blood (ONETOUCH VERIO) test strip Use to monitor glucose levels three times daily; E11.9; E10.42 100 each 12  . Insulin Pen Needle 32G X 4 MM MISC Used to give insulin injections twice daily. 100 each 11  . insulin regular human CONCENTRATED (HUMULIN R U-500 KWIKPEN) 500 UNIT/ML kwikpen Inject 250 Units into the skin 2 (two) times a day. (Patient taking differently: Inject 180-230 Units into the skin 2 (two) times a day. Sliding scale) 14 pen 11  . ipratropium-albuterol (DUONEB) 0.5-2.5 (3) MG/3ML SOLN Take 3 mLs by nebulization every 4 (four) hours as needed (wheezing/shortness of breath). 360 mL 0  . loperamide (IMODIUM A-D) 2 MG tablet Take 4 mg by mouth 3 (three) times daily as needed for diarrhea or loose stools.    . metolazone (ZAROXOLYN) 2.5 MG tablet Take 2.5 mg by mouth at bedtime.    Glory Rosebush Delica Lancets [id number] MISC 1 each by Does not apply route 3 (three) times daily. Use to monitor glucose levels three times daily; E11.9; E10.42 100 each 11  . OXYGEN Inhale 4 L into the lungs at bedtime.     . pregabalin (LYRICA) 300 MG capsule TAKE 1 CAPSULE BY  MOUTH TWICE DAILY (Patient taking differently: Take 300 mg by mouth daily. ) 180 capsule 0  . PROVENTIL HFA 108 (90 Base) MCG/ACT inhaler INHALE 2 PUFFS BY MOUTH INTO THE LUNGS EVERY 4 HOURS AS NEEDED FOR SHORTNESS OFBREATH 6.7 g 0  . rOPINIRole (REQUIP) 1 MG tablet TAKE 1  TABLET BY MOUTH EVERY NIGHT AT BEDTIME FOR RESTLESS LEGS. (Patient taking differently: Take 1 mg by mouth daily. ) 90 tablet 1  . spironolactone (ALDACTONE) 50 MG tablet TAKE 1 TABLET BY MOUTH ONCE A DAY (Patient taking differently: Take 50 mg by mouth at bedtime. ) 90 tablet 0  . tobramycin (TOBREX) 0.3 % ophthalmic solution Place 1 drop into both eyes daily.     Marland Kitchen torsemide (DEMADEX) 20 MG tablet Take 20 mg by mouth 2 (two) times daily.     . traZODone (DESYREL) 100 MG tablet Take 100 mg by mouth daily.     Marland Kitchen ULTICARE MINI PEN NEEDLES 31G X 6 MM MISC USE AS DIRECTED THREE TIMES DAILY 100 each 0   No current facility-administered medications on file prior to visit.     BP 126/76   Pulse 90   Temp 97.9 F (36.6 C) (Temporal)   Ht 5\' 4"  (1.626 m)   Wt 283 lb 8 oz (128.6 kg)   LMP 11/11/2011   SpO2 90%   BMI 48.66 kg/m    Objective:   Physical Exam  Constitutional: She appears well-nourished.  Respiratory: Effort normal.  Skin: Skin is warm and dry. There is erythema.  Mild to moderate erythema to left anterior chin of lower extremity with 2-3cm rounded open superficial wound with scant drainage.  Partial thickness wound without drainage and mild erythema to PIP joints of 2nd and 3rd digits.           Assessment & Plan:

## 2019-06-04 NOTE — Assessment & Plan Note (Signed)
Noted to right digits. Check plain films today to assess for joint involvement.  Treating left lower extremity cellulitis with Bactrim DS course. Wound referral placed. Consider hand surgery consult.

## 2019-06-04 NOTE — Telephone Encounter (Signed)
I forgot to discuss these refills during her visit.  1. How often is she taking the Lyrica for her neuropathy? Once daily? More? 2. How often is she using the albuterol inhaler? It looks like she just had this filled last month. It's just a PRN medication. 3. Does she see pulmonology?

## 2019-06-04 NOTE — Assessment & Plan Note (Signed)
Evident to left lower extremity. Dressing applied today. Referral placed for wound consult. Rx for Bactrim DS course ordered. CBC pending.

## 2019-06-05 ENCOUNTER — Other Ambulatory Visit: Payer: Self-pay | Admitting: Primary Care

## 2019-06-05 DIAGNOSIS — J449 Chronic obstructive pulmonary disease, unspecified: Secondary | ICD-10-CM

## 2019-06-05 IMAGING — DX DG LUMBAR SPINE COMPLETE 4+V
5 series · 5 of 5 positions shown · non-contrast
Comparison: CT 01/19/2018.  KUB 01/15/2018.

CLINICAL DATA: Back pain.  Lower extremity weakness.

EXAM:
LUMBAR SPINE - COMPLETE 4+ VIEW

[l-spine ap]
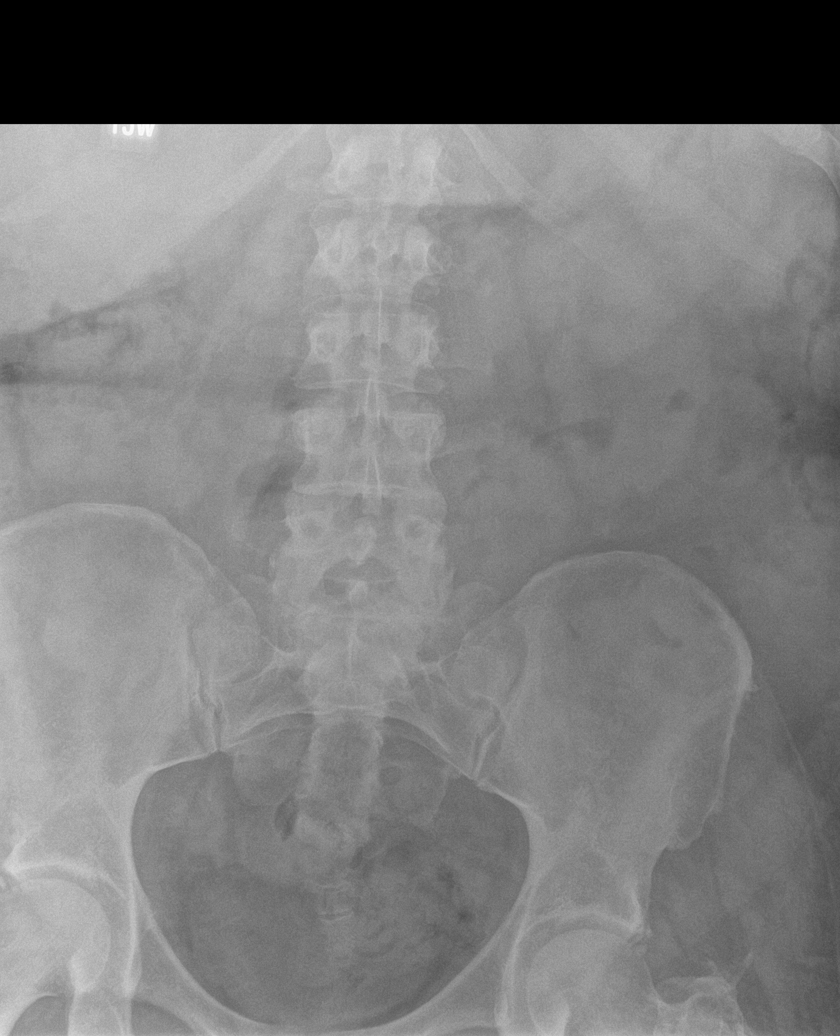

[l-spine obl (1 of 2)]
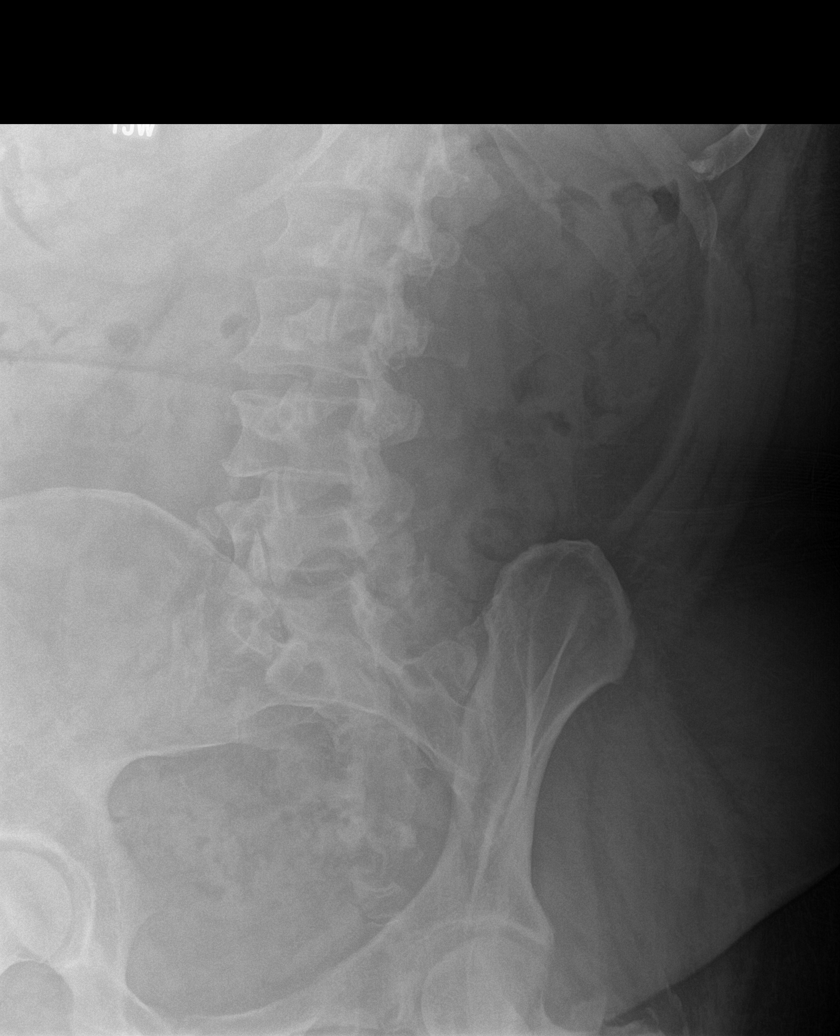

[l-spine obl (2 of 2)]
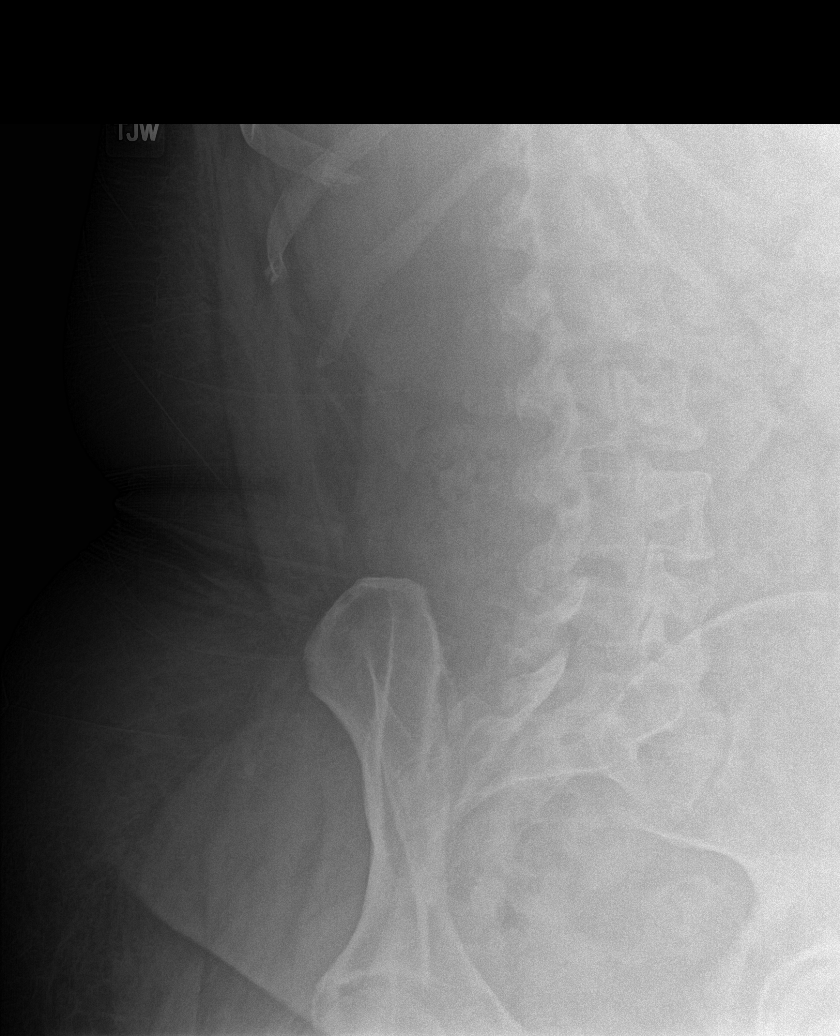

[l-spine lat]
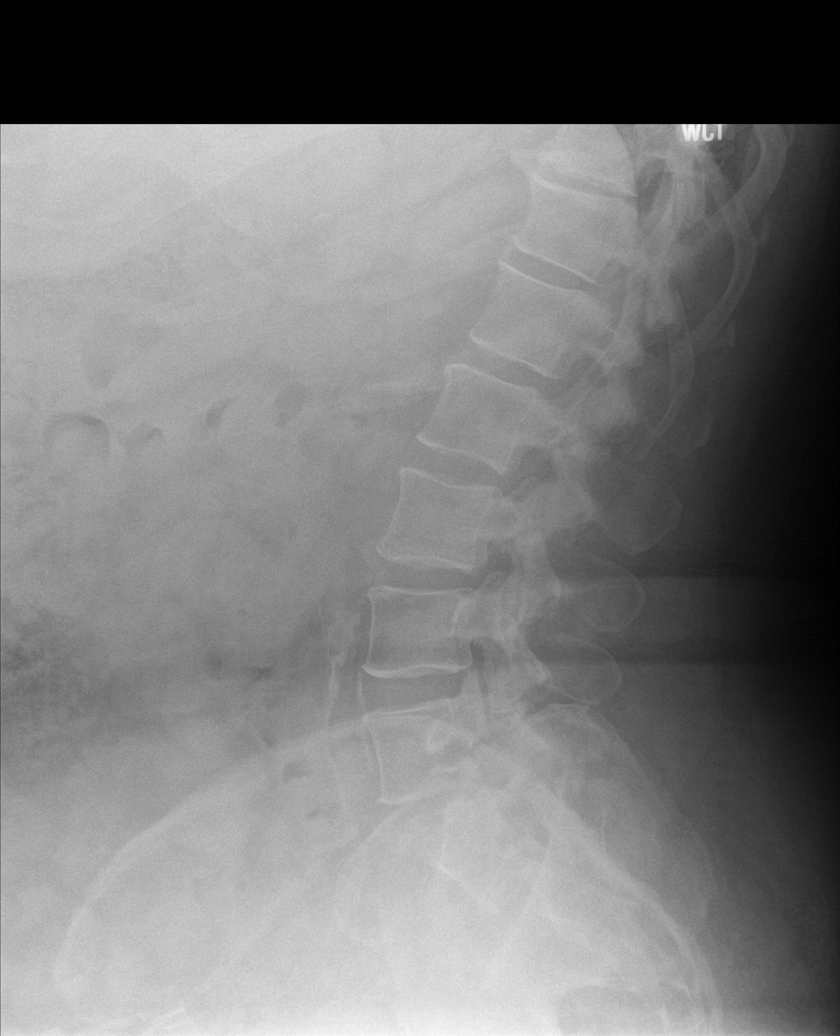

[l-spine l5/s1]
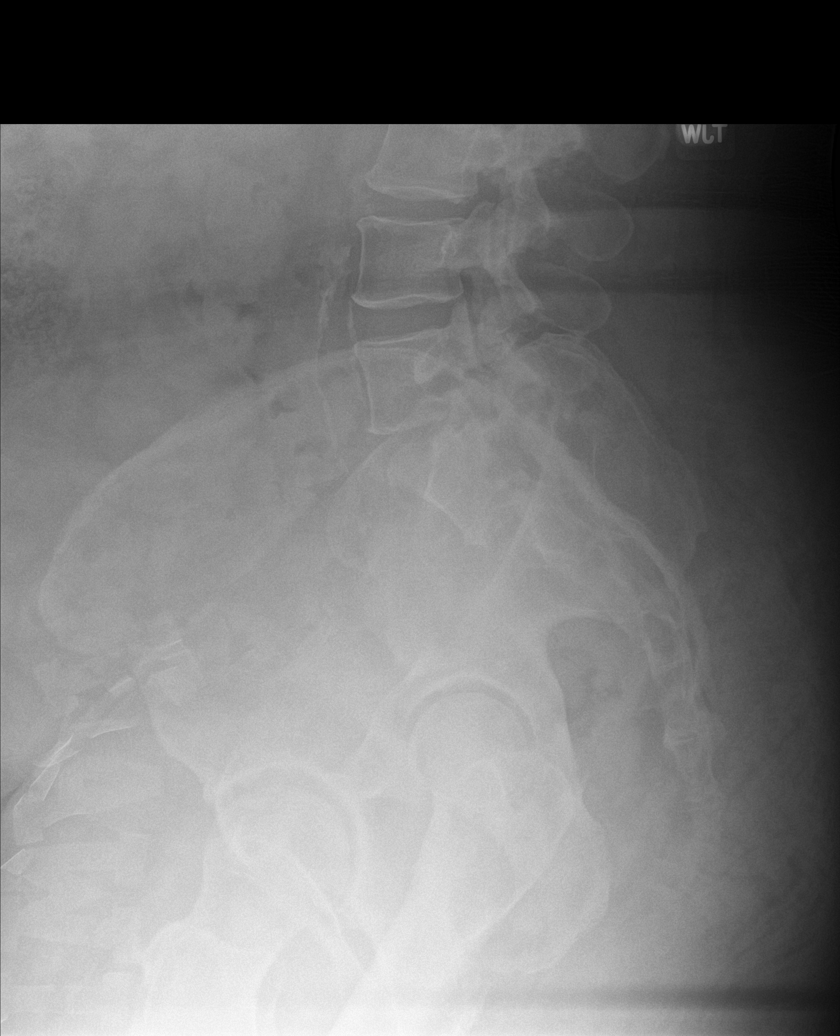

[5 of 5 positions shown; findings below may reference images not displayed]

FINDINGS: Diffuse multilevel degenerative change. No acute bony abnormality
identified. No evidence of fracture. Aortoiliac atherosclerotic
calcification.
IMPRESSION: 1. Diffuse multilevel degenerative change. No acute bony
abnormality.

2.  Aortoiliac atherosclerotic vascular disease.

## 2019-06-05 NOTE — Telephone Encounter (Signed)
Spoken and notified patient of [name] comments. Patient stated that yes, she does have heartburn. Will refill as instructed.

## 2019-06-05 NOTE — Telephone Encounter (Signed)
Noted. Will refill Symbicort for which she should be using BID. Refill also sent for Lyrica.

## 2019-06-05 NOTE — Telephone Encounter (Signed)
Spoken and notified patient of [name] comments.  1. Yes, patient is taking Lyrica for neuropathy for BID  2. Notified patient stated that she has been using 2 times a day. Notified patient that she should not use it every day and she should use only symbicort every day. I have told her let pulmonology at her appointment on 06/12/2019

## 2019-06-12 ENCOUNTER — Encounter: Payer: Self-pay | Admitting: Internal Medicine

## 2019-06-12 ENCOUNTER — Ambulatory Visit (INDEPENDENT_AMBULATORY_CARE_PROVIDER_SITE_OTHER): Payer: BC Managed Care – PPO | Admitting: Internal Medicine

## 2019-06-12 DIAGNOSIS — F17210 Nicotine dependence, cigarettes, uncomplicated: Secondary | ICD-10-CM | POA: Diagnosis not present

## 2019-06-12 DIAGNOSIS — J9611 Chronic respiratory failure with hypoxia: Secondary | ICD-10-CM | POA: Diagnosis not present

## 2019-06-12 DIAGNOSIS — Z720 Tobacco use: Secondary | ICD-10-CM

## 2019-06-12 DIAGNOSIS — J9612 Chronic respiratory failure with hypercapnia: Secondary | ICD-10-CM

## 2019-06-12 DIAGNOSIS — J449 Chronic obstructive pulmonary disease, unspecified: Secondary | ICD-10-CM | POA: Diagnosis not present

## 2019-06-12 MED ORDER — PROVENTIL HFA 108 (90 BASE) MCG/ACT IN AERS: INHALATION_SPRAY | RESPIRATORY_TRACT | 6 refills | Status: DC

## 2019-06-12 MED ORDER — BUDESONIDE-FORMOTEROL FUMARATE 80-4.5 MCG/ACT IN AERO: INHALATION_SPRAY | RESPIRATORY_TRACT | 6 refills | Status: DC

## 2019-06-12 NOTE — Patient Instructions (Signed)
Smoking cessation strongly advised Continue inhalers as prescribed Continue oxygen as prescribed

## 2019-06-12 NOTE — Progress Notes (Signed)
[facility] Consultation      I connected with the patient by telephone enabled telemedicine visit and verified that I am speaking with the correct person using two identifiers.    I discussed the limitations, risks, security and privacy concerns of performing an evaluation and management service by telemedicine and the availability of in-person appointments. I also discussed with the patient that there may be a patient responsible charge related to this service. The patient expressed understanding and agreed to proceed.  PATIENT AGREES AND CONFIRMS -YES   Other persons participating in the visit and their role in the encounter: Patient, nursing  This visit type was conducted due to national recommendations for restrictions regarding the COVID-19 Pandemic (e.g. social distancing).  This format is felt to be most appropriate for this patient at this time.  All issues noted in this document were discussed and addressed.      **Desat walk at rest on RA. Sat was 83% HR 87. Walked 180 feet at slow waddling pace complained of dyspnea, but appeared only mildly dyspneic. Sat dropped to 86% and HR 92.  Oxygen @2L  at rest--91% at rest.    **Imaging personally reviewed CXR 03/29/2017>> change to chronic bronchitis, elevated right diaphragm. CXR : 03/17/2017>>Cardiomegaly with mild pulmonary vascular congestion. Echo 03/15/2017>> EF 65-70% 08/22/2016>> Spirometry shows moderate restriction. Sleep study 02/21/14; negative for OSA.    Date: 06/12/2019  MRN# [id number] Ace Hocutt 1970-07-22   SYNOPSIS The patient is a 48 year old female with severe COPD, oxygen dependent, she was previously seeing Dr. Melvyn Novas but dismissed from the practice due to missed appointments. She missed her last appointment here on 12/14/17.  She has severe debility/deconditioning, she was maintained on Symbicort, referred to pulmonary rehab. She was also scheduled for a sleep study on 02/06/17 but did not go.   She states her edema is well controlled with current dosing of Demedex.  Pt notes that she has dyspnea with walking, especially when it is hot and humid. She is not smoking her husband smokes half ppd but the patient smells strongly of smoke.  She has 2 dogs, one sleeps in bed with her.  She uses an symbicort inhaler which she uses 2 puffs twice per day, albuterol MDI 2 or 3 times per day. She uses to be on spiriva but it made her throat close up. She tried advair but it gave her vaginal infections apparently.  She is currently on 4L oxygen at night. She does not use portable oxygen.   CC  follow up COPD  HPI  No  COPD exacerbation at this time No evidence of heart failure at this time No evidence or signs of infection at this time No respiratory distress No fevers, chills, nausea, vomiting, diarrhea No evidence of lower extremity edema No evidence hemoptysis   Smoking Assessment and Cessation Counseling Upon further questioning, Patient smokes 1/2 ppd I have advised patient to quit/stop smoking as soon as possible due to high risk for multiple medical problems Patient  is NOT willing to quit smoking I have advised patient that we can assist and have options of Nicotine replacement therapy. I also advised patient on behavioral therapy and can provide oral medication therapy in conjunction with the other therapies Follow up next Office visit  for assessment of smoking cessation Smoking cessation counseling advised for 4 minutes    PMHX:   Past Medical History:  Diagnosis Date  . Anxiety    takes Prozac daily  . Anxiety   .  Aortic valve calcification   . Asthma    Advair and Spirva daily  . Asthma   . Bipolar disorder ([place])   . CAD (coronary artery disease)    a. LHC 11/2013 done for CP/fluid retention: mild disease in prox LAD, mild-mod disease in mRCA, EF 60% with normal LVEDP. b. Normal nuc 03/2016.  Marland Kitchen CHF (congestive heart failure) ([place])   . Chronic diastolic CHF  (congestive heart failure) ([place])   . CKD (chronic kidney disease), stage II   . COPD (chronic obstructive pulmonary disease) ([facility])    a. nocturnal O2.  Marland Kitchen COPD (chronic obstructive pulmonary disease) ([place])   . Coronary artery disease   . Decreased urine stream   . Diabetes mellitus   . Diabetes mellitus without complication ([place])   . Dyspnea   . Family history of adverse reaction to anesthesia    mom gets nauseated  . GERD (gastroesophageal reflux disease)    takes Pepcid daily  . History of blood clots    left leg 3-61yrs ago  . Hyperlipidemia   . Hypertension   . Hypertriglyceridemia   . Inguinal hernia, left 01/2015  . Muscle spasm   . Open wound of genital labia   . Peripheral neuropathy   . RBBB   . Seizures ([place])   . Sepsis ([place]) 01/19/2018  . Sinus tachycardia    a. persistent since 2009.  Marland Kitchen Smokers' cough ([place])   . Stroke [facility]) 1989   left sided weakness  . TIA (transient ischemic attack)   . Tobacco abuse   . Vulvar abscess 01/23/2018   Surgical Hx:  Past Surgical History:  Procedure Laterality Date  . HERNIA REPAIR    . INCISION AND DRAINAGE ABSCESS N/A 01/26/2018   Procedure: INCISION AND DEBRIDEMENT OF VULVAR NECROTIZING SOFT TISSUE INFECTION;  Surgeon: Greer Pickerel, MD;  Location: [place];  Service: General;  Laterality: N/A;  . INCISION AND DRAINAGE PERIRECTAL ABSCESS N/A 01/22/2018   Procedure: IRRIGATION AND DEBRIDEMENT LABIAL/VULVAR AREA;  Surgeon: Coralie Keens, MD;  Location: [place];  Service: General;  Laterality: N/A;  . INCISION AND DRAINAGE PERIRECTAL ABSCESS N/A 01/29/2018   Procedure: IRRIGATION AND DEBRIDEMENT VULVA;  Surgeon: Excell Seltzer, MD;  Location: [place];  Service: General;  Laterality: N/A;  . INGUINAL HERNIA REPAIR Left 04/08/2015   Procedure: OPEN LEFT INGUINAL HERNIA REPAIR WITH MESH;  Surgeon: Ralene Ok, MD;  Location: [place];  Service: General;  Laterality: Left;  . INSERTION OF MESH Left 04/08/2015   Procedure: INSERTION OF  MESH;  Surgeon: Ralene Ok, MD;  Location: [place];  Service: General;  Laterality: Left;  . LAPAROSCOPY     Endometriosis  . LEFT HEART CATHETERIZATION WITH CORONARY ANGIOGRAM N/A 12/05/2013   Procedure: LEFT HEART CATHETERIZATION WITH CORONARY ANGIOGRAM;  Surgeon: Jettie Booze, MD;  Location: [facility] C[facility] LAB;  Service: Cardiovascular;  Laterality: N/A;  . right kidney drained    . TEE WITHOUT CARDIOVERSION N/A 11/28/2013   Procedure: TRANSESOPHAGEAL ECHOCARDIOGRAM (TEE);  Surgeon: Thayer Headings, MD;  Location: [facility] ENDOSCOPY;  Service: Cardiovascular;  Laterality: N/A;   Family Hx:  Family History  Problem Relation Age of Onset  . Venous thrombosis Brother   . Other Brother        BRAIN TUMOR  . Asthma Father   . Diabetes Father   . Coronary artery disease Mother   . Hypertension Mother   . Diabetes Mother   . Asthma Sister   . Diabetes type II Brother  Social Hx:   Social History   Tobacco Use  . Smoking status: Current Some Day Smoker    Packs/day: 0.50    Years: 36.00    Pack years: 18.00    Types: Cigarettes    Last attempt to quit: 01/18/2018    Years since quitting: 1.3  . Smokeless tobacco: Never Used  Substance Use Topics  . Alcohol use: No    Frequency: Never  . Drug use: No   Medication:    Current Outpatient Medications:  .  acetaminophen (TYLENOL) 500 MG tablet, Take 500 mg by mouth every 4 (four) hours as needed for moderate pain. , Disp: , Rfl:  .  ALPRAZolam (XANAX) 0.5 MG tablet, Take 1 tablet (0.5 mg total) by mouth 2 (two) times daily as needed for anxiety. (Patient taking differently: Take 1 mg by mouth daily. ), Disp: , Rfl: 0 .  aspirin EC 81 MG tablet, Take 81 mg by mouth every morning., Disp: , Rfl:  .  budesonide (PULMICORT) 0.5 MG/2ML nebulizer solution, Take 0.5 mg by nebulization 2 (two) times daily., Disp: , Rfl:  .  budesonide-formoterol (SYMBICORT) 80-4.5 MCG/ACT inhaler, INHALE 2 PUFFS BY MOUTH EVERY 12 HOURS TO PREVENT COUGH OR  WHEEZING *RINSE MOUTH AFTER EACH USE*, Disp: 10.2 g, Rfl: 2 .  busPIRone (BUSPAR) 30 MG tablet, Take 30 mg by mouth 2 (two) times daily., Disp: , Rfl:  .  famotidine (PEPCID) 20 MG tablet, TAKE 1 TABLET (20 MG TOTAL) BY MOUTH 2 (TWO) TIMES DAILY. FOR HEARTBURN., Disp: 180 tablet, Rfl: 1 .  FLUoxetine (PROZAC) 40 MG capsule, Take 40 mg by mouth at bedtime. , Disp: , Rfl:  .  glucose blood (ONETOUCH VERIO) test strip, Use to monitor glucose levels three times daily; E11.9; E10.42, Disp: 100 each, Rfl: 12 .  Insulin Pen Needle 32G X 4 MM MISC, Used to give insulin injections twice daily., Disp: 100 each, Rfl: 11 .  insulin regular human CONCENTRATED (HUMULIN R U-500 KWIKPEN) 500 UNIT/ML kwikpen, Inject 250 Units into the skin 2 (two) times a day. (Patient taking differently: Inject 180-230 Units into the skin 2 (two) times a day. Sliding scale), Disp: 14 pen, Rfl: 11 .  ipratropium-albuterol (DUONEB) 0.5-2.5 (3) MG/3ML SOLN, Take 3 mLs by nebulization every 4 (four) hours as needed (wheezing/shortness of breath)., Disp: 360 mL, Rfl: 0 .  loperamide (IMODIUM A-D) 2 MG tablet, Take 4 mg by mouth 3 (three) times daily as needed for diarrhea or loose stools., Disp: , Rfl:  .  metolazone (ZAROXOLYN) 2.5 MG tablet, Take 2.5 mg by mouth at bedtime., Disp: , Rfl:  .  OneTouch Delica Lancets [id number] MISC, 1 each by Does not apply route 3 (three) times daily. Use to monitor glucose levels three times daily; E11.9; E10.42, Disp: 100 each, Rfl: 11 .  OXYGEN, Inhale 4 L into the lungs at bedtime. , Disp: , Rfl:  .  pregabalin (LYRICA) 300 MG capsule, Take 1 capsule (300 mg total) by mouth 2 (two) times daily. as needed for neuropathy., Disp: 180 capsule, Rfl: 0 .  PROVENTIL HFA 108 (90 Base) MCG/ACT inhaler, INHALE 2 PUFFS BY MOUTH INTO THE LUNGS EVERY 4 HOURS AS NEEDED FOR SHORTNESS OFBREATH, Disp: 6.7 g, Rfl: 0 .  rOPINIRole (REQUIP) 1 MG tablet, TAKE 1 TABLET BY MOUTH EVERY NIGHT AT BEDTIME FOR RESTLESS LEGS. (Patient  taking differently: Take 1 mg by mouth daily. ), Disp: 90 tablet, Rfl: 1 .  spironolactone (ALDACTONE) 50 MG tablet, TAKE  1 TABLET BY MOUTH ONCE A DAY (Patient taking differently: Take 50 mg by mouth at bedtime. ), Disp: 90 tablet, Rfl: 0 .  sulfamethoxazole-trimethoprim (BACTRIM DS) 800-160 MG tablet, Take 1 tablet by mouth 2 (two) times daily. For skin infection., Disp: 20 tablet, Rfl: 0 .  tobramycin (TOBREX) 0.3 % ophthalmic solution, Place 1 drop into both eyes daily. , Disp: , Rfl:  .  torsemide (DEMADEX) 20 MG tablet, Take 20 mg by mouth 2 (two) times daily. , Disp: , Rfl:  .  traZODone (DESYREL) 100 MG tablet, Take 100 mg by mouth daily. , Disp: , Rfl:  .  ULTICARE MINI PEN NEEDLES 31G X 6 MM MISC, USE AS DIRECTED THREE TIMES DAILY, Disp: 100 each, Rfl: 0   Allergies:  Adhesive [tape], Metoprolol, Montelukast, Morphine sulfate, Penicillins, Prednisone, Diltiazem, and Gabapentin   Review of Systems:  Gen:  Denies  fever, sweats, chills weight loss  HEENT: Denies blurred vision, double vision, ear pain, eye pain, hearing loss, nose bleeds, sore throat Cardiac:  No dizziness, chest pain or heaviness, chest tightness,edema, No JVD Resp:   No cough, -sputum production, +shortness of breath,-wheezing, -hemoptysis,  Gi: Denies swallowing difficulty, stomach pain, nausea or vomiting, diarrhea, constipation, bowel incontinence Gu:  Denies bladder incontinence, burning urine Ext:   Denies Joint pain, stiffness or swelling Skin: Denies  skin rash, easy bruising or bleeding or hives Endoc:  Denies polyuria, polydipsia , polyphagia or weight change Psych:   Denies depression, insomnia or hallucinations  Other:  All other systems negative     Assessment and Plan:  Moderate to severe COPD based on symptoms with  restrictive lung disease seen with chronic respiratory failure from COPD and hypercapnic respiratory failure With elevated right diaphragm  Moderate to severe COPD Patient needs  and uses Symbicort She also needs albuterol as needed No evidence of infection at this time No indication for prednisone therapy at this time or antibiotics  Chronic hypoxic respiratory failure from COPD Patient uses and benefits from oxygen therapy she needs 4 L of oxygen at night She needs and uses oxygen 2 survive  Obesity -recommend significant weight loss -recommend changing diet  Deconditioned state -Recommend increased daily activity and exercise  Cessation strongly advised  COVID-19 EDUCATION: The signs and symptoms of COVID-19 were discussed with the patient and how to seek care for testing.  The importance of social distancing was discussed today. Hand Washing Techniques and avoid touching face was advised.     MEDICATION ADJUSTMENTS/LABS AND TESTS ORDERED: Continue oxygen as prescribed and as needed Continue inhalers as prescribed   CURRENT MEDICATIONS REVIEWED AT LENGTH WITH PATIENT TODAY   Patient satisfied with Plan of action and management. All questions answered  Follow up in 6 months   Willadean [place]ton Patricia Pesa, M.D.  [facility]  Medical Director [place] Director [facility] Cardio-Pulmonary Department

## 2019-06-13 IMAGING — CT CT HEAD W/O CM
4 series · 16 of 47 positions shown, 18 images · non-contrast
Comparison: 03/17/2017

CLINICAL DATA: Slurred speech.  Left-sided weakness.

EXAM:
CT HEAD WITHOUT CONTRAST
TECHNIQUE: Contiguous axial images were obtained from the base of the skull
through the vertex without intravenous contrast.

[Series 3: head without · axial · non-contrast · 0.44mm/px · z∈[-214,-78]mm · 7 of 37 slices shown, 9 images]
[im 5/37  brain]
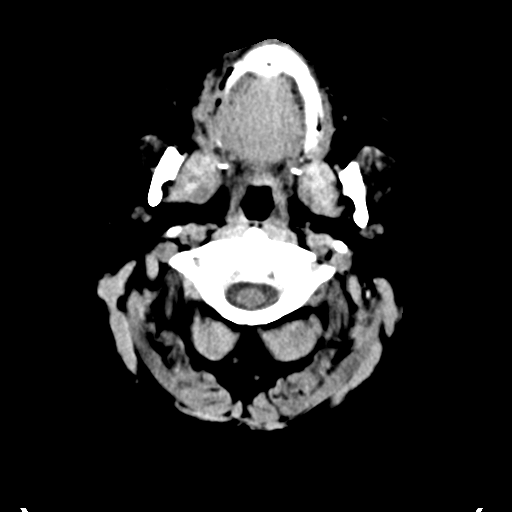
[im 5/37  bone]
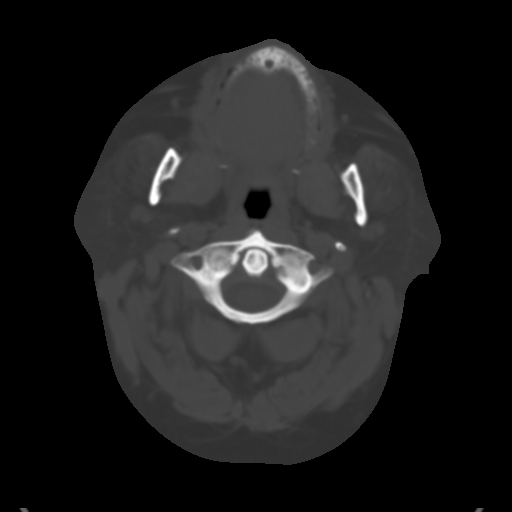
[im 10/37  brain]
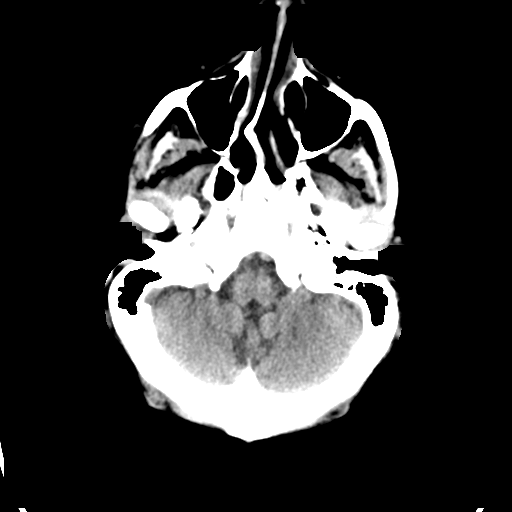
[im 14/37  brain]
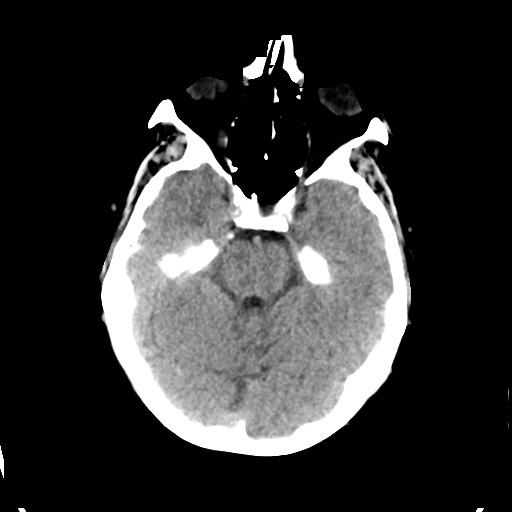
[im 19/37  brain]
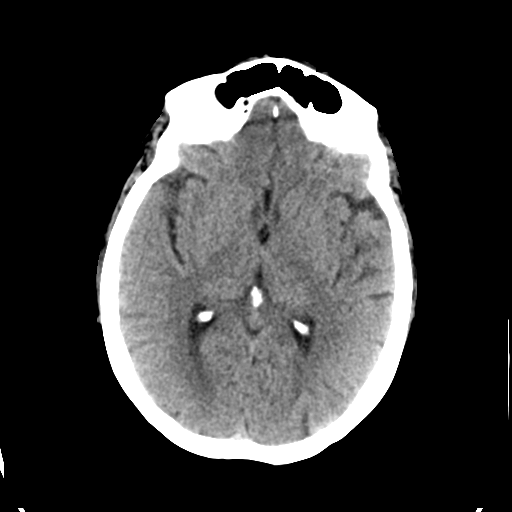
[im 23/37  brain]
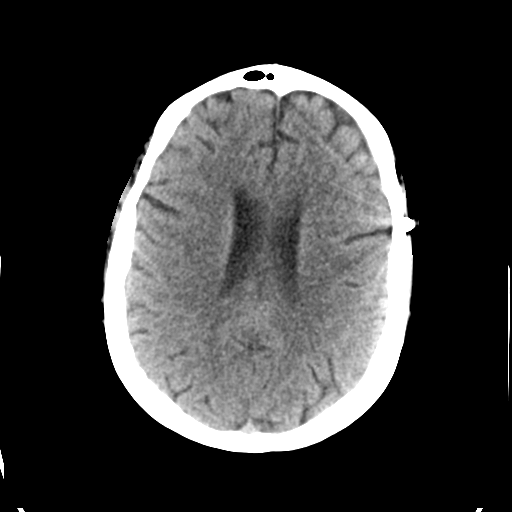
[im 23/37  bone]
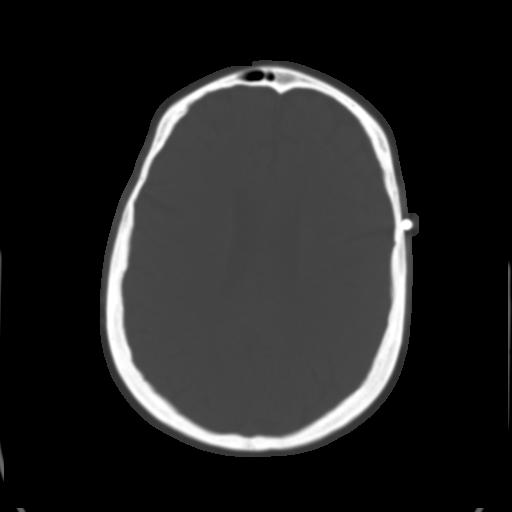
[im 28/37  brain]
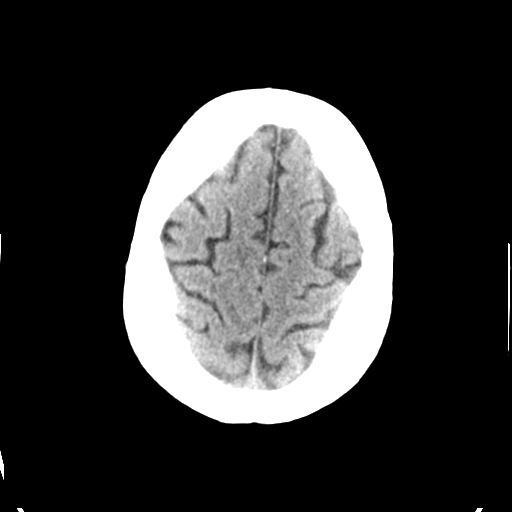
[im 32/37  brain]
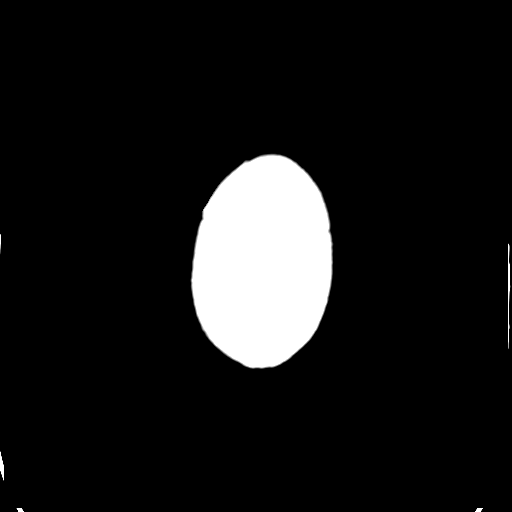

[Series 4: head bone · axial · 0.44mm/px · z∈[-216,-180]mm · 3 of 91 slices shown]
[im 10/91  bone]
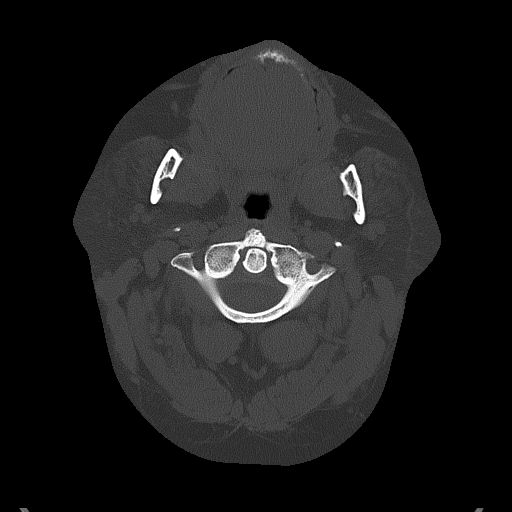
[im 19/91  bone]
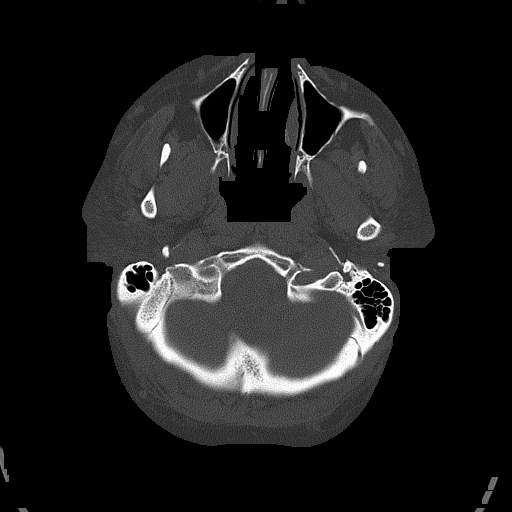
[im 28/91  bone]
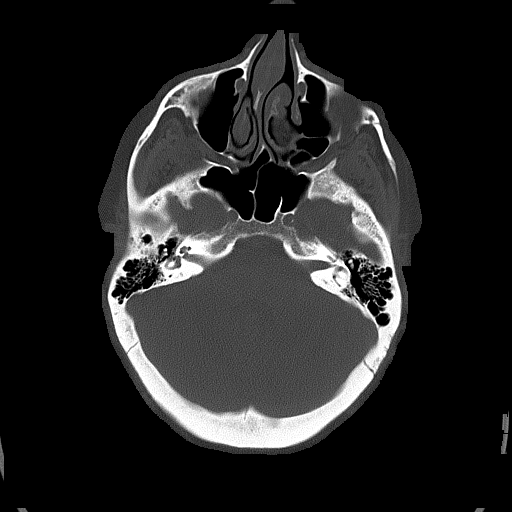

[Series 5: head without cor · coronal · non-contrast · 0.42mm/px · 3 of 67 slices shown]
[im 23/67  brain]
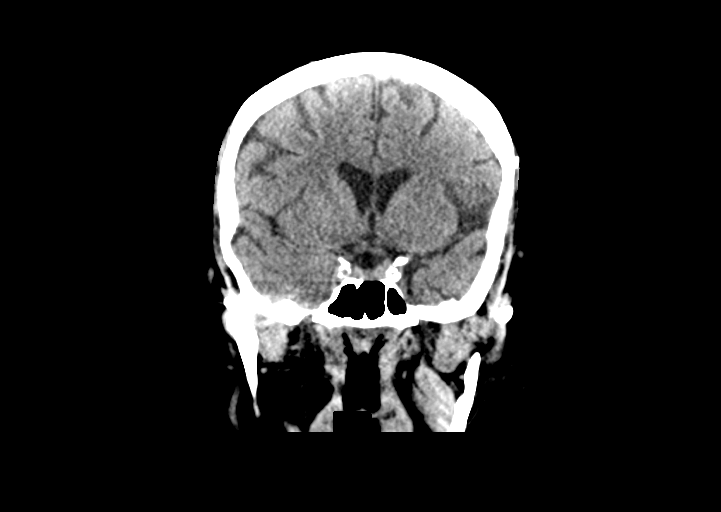
[im 30/67  brain]
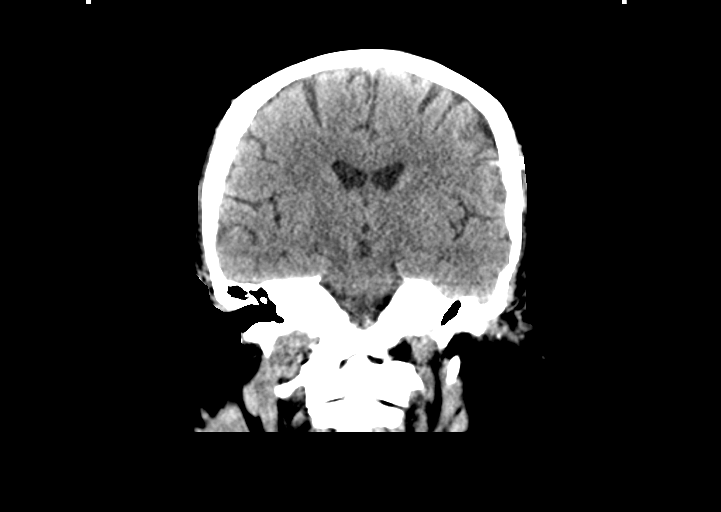
[im 37/67  brain]
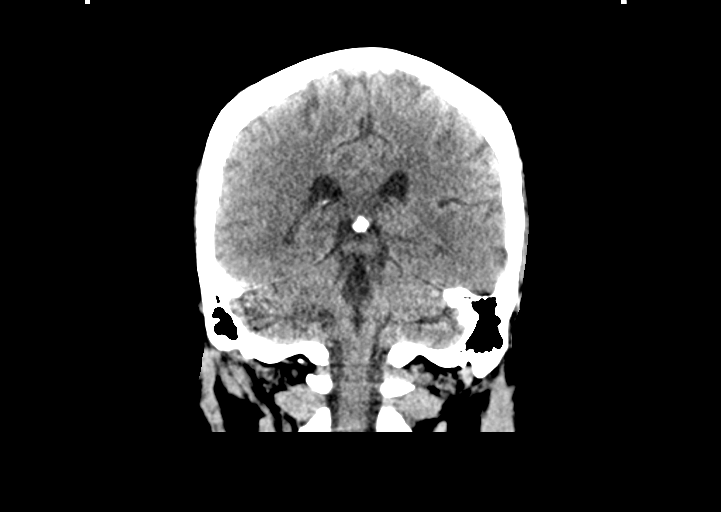

[Series 6: head without sag · sagittal · non-contrast · 0.43mm/px · 3 of 67 slices shown]
[im 23/67  brain]
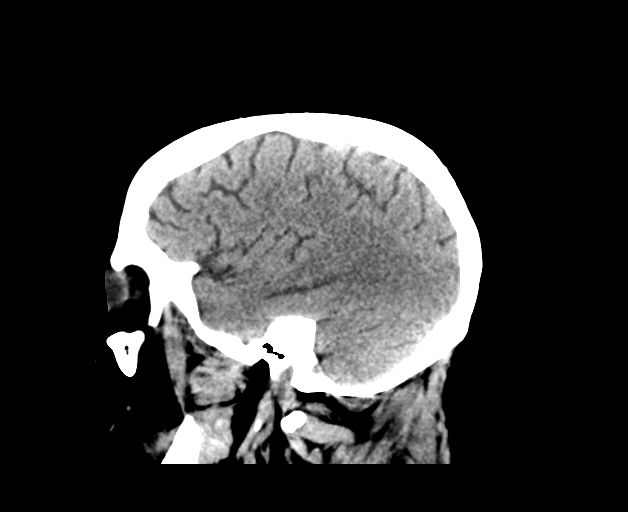
[im 34/67  brain]
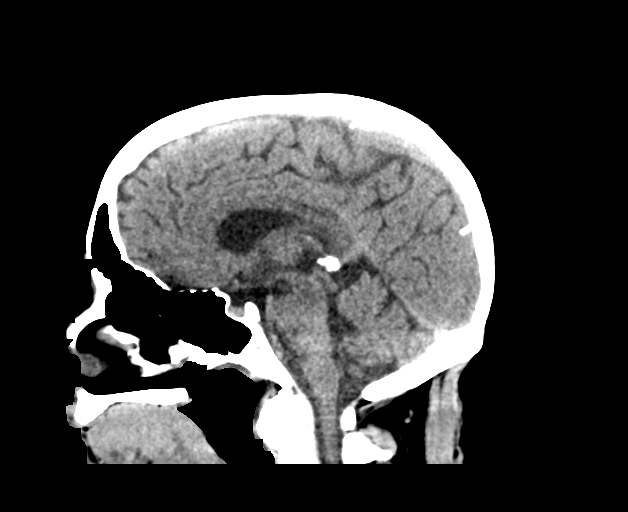
[im 45/67  brain]
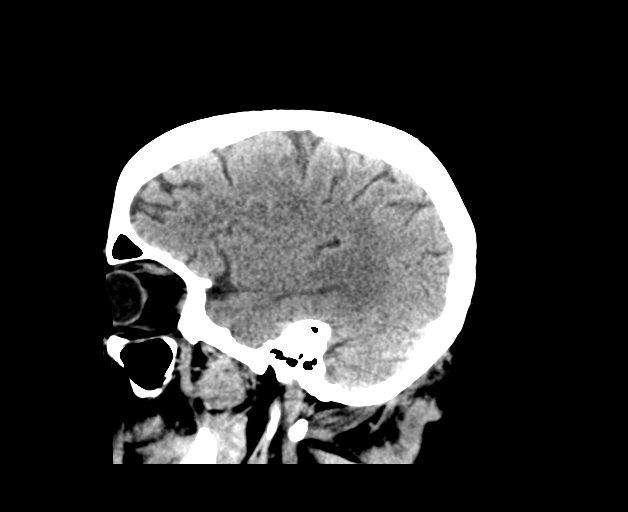

[16 of 47 positions shown; findings below may reference images not displayed]

FINDINGS: Brain: No evidence of acute infarction, hemorrhage, hydrocephalus,
extra-axial collection or mass lesion/mass effect.

Vascular: No hyperdense vessel or unexpected calcification.

Skull: Normal. Negative for fracture or focal lesion.

Sinuses/Orbits: No acute finding.

Other: Metallic BB within left side of scalp is again noted
IMPRESSION: 1. No acute intracranial abnormality.  Negative head CT

## 2019-06-13 IMAGING — DX DG CHEST 2V
2 series · 2 of 2 positions shown · non-contrast
Comparison: 08/07/2018

CLINICAL DATA: Encephalopathy. Pt c/o slurred speech and headache x
1 day. Hx of TIA, stroke, HTN, GERD, DM, CAD, COPD, CKD, CHF, AND
asthma. Pt is a current smoker.

EXAM:
CHEST - 2 VIEW

[x chest ap]
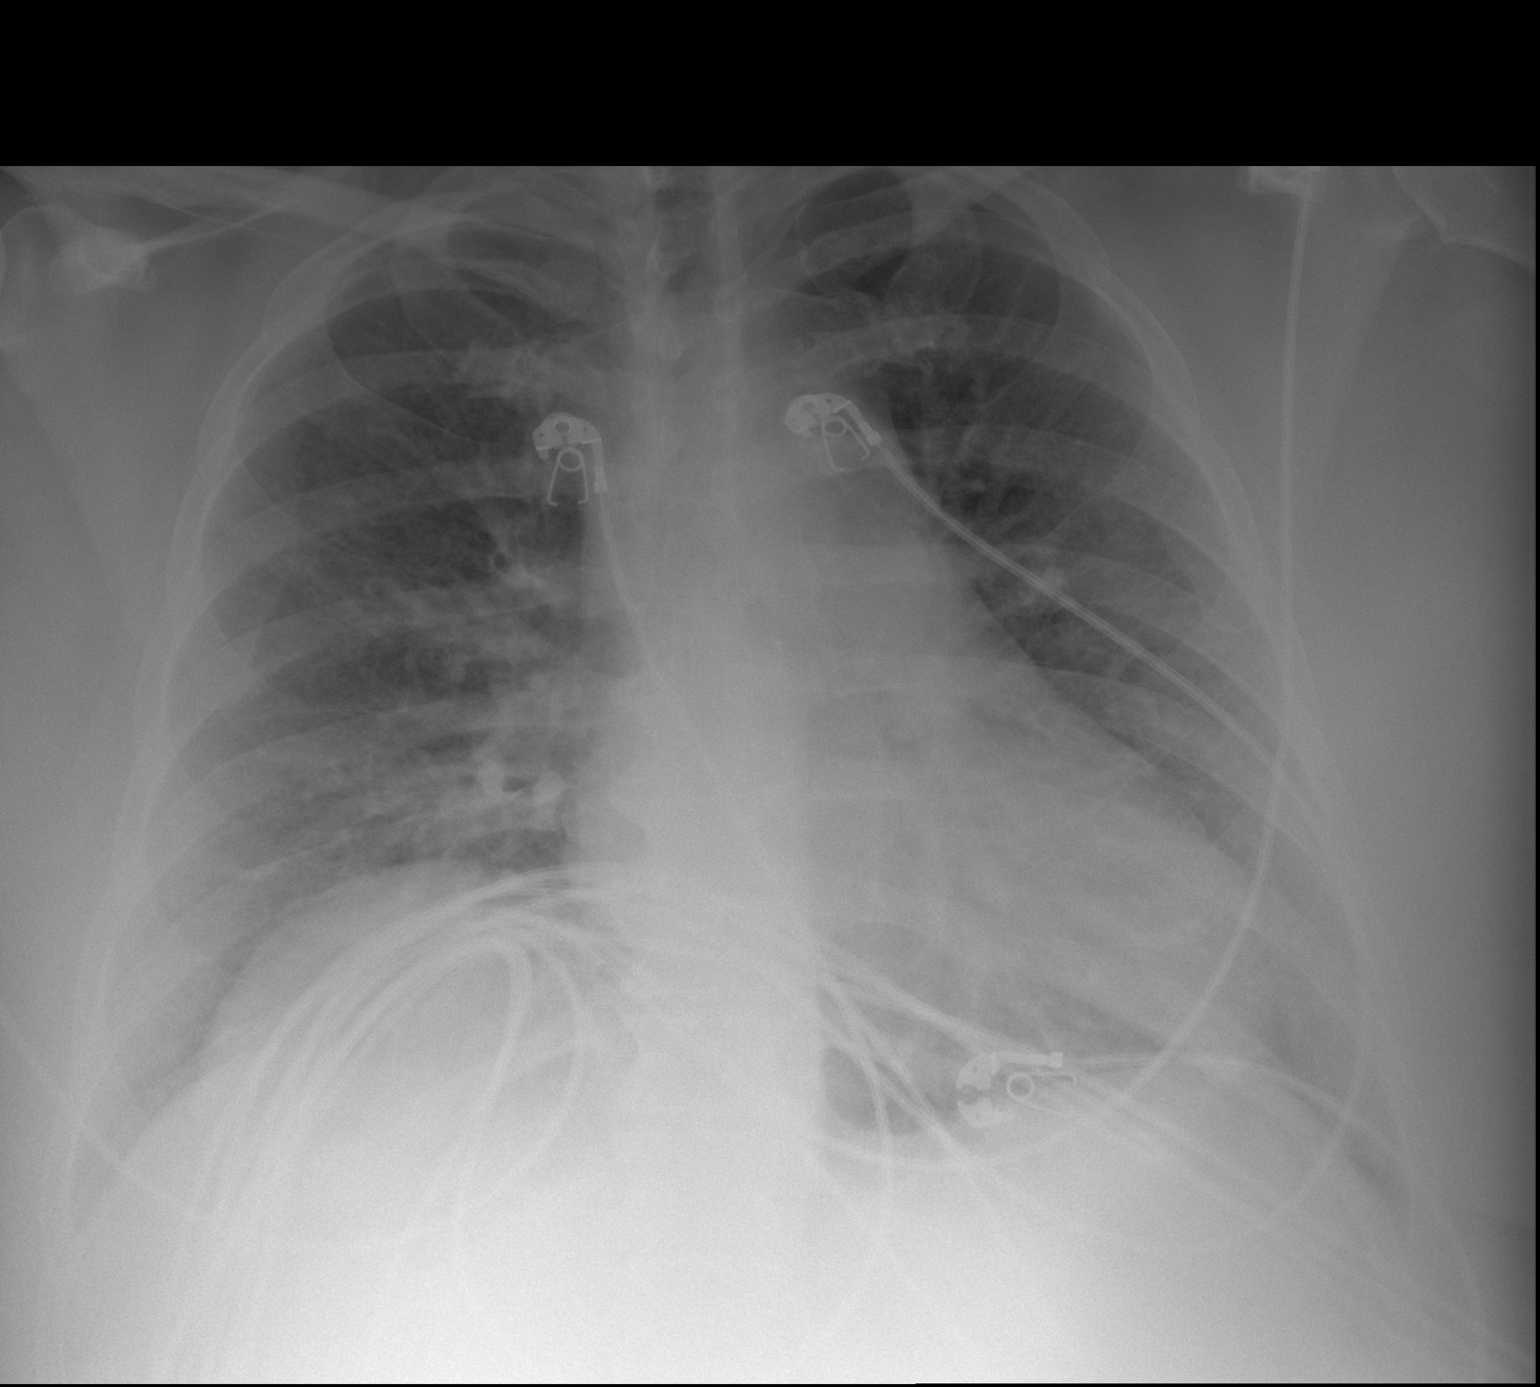

[w chest lat]
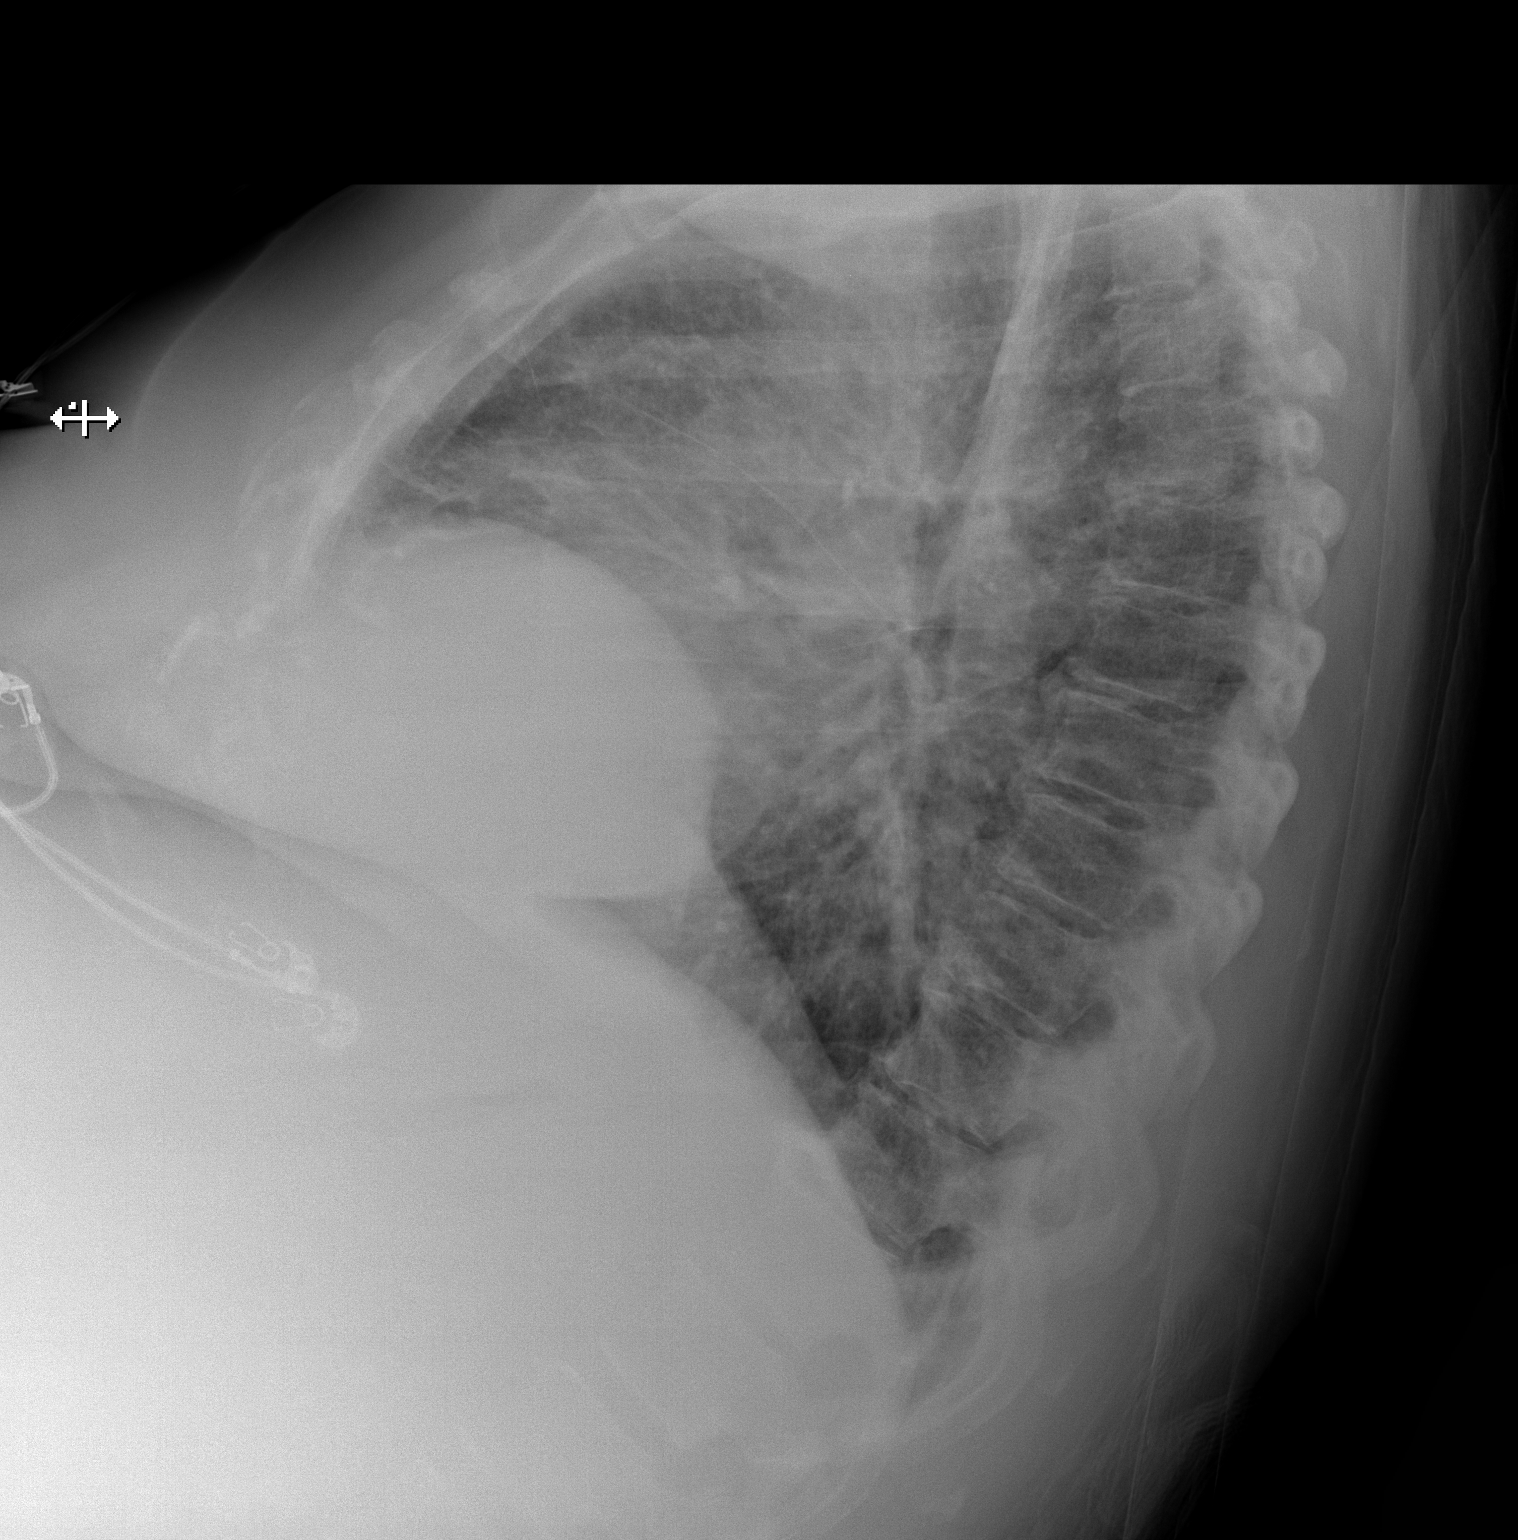

[2 of 2 positions shown; findings below may reference images not displayed]

FINDINGS: Cardiac silhouette is normal in size. No mediastinal or hilar masses
or convincing adenopathy.

There are mild linear opacities in the anterior lung base consistent
with atelectasis or scarring, stable from the prior study. Mildly
prominent bronchovascular markings are noted bilaterally, also
unchanged. No evidence of pneumonia or pulmonary edema. No pleural
effusion or pneumothorax.

Skeletal structures are intact.
IMPRESSION: No active cardiopulmonary disease.

## 2019-06-14 IMAGING — CT CT HEAD W/O CM
4 series · 16 of 47 positions shown, 18 images · non-contrast
Comparison: 09/13/2018

CLINICAL DATA: Subacute neurological deficits, follow-up

EXAM:
CT HEAD WITHOUT CONTRAST
TECHNIQUE: Contiguous axial images were obtained from the base of the skull
through the vertex without intravenous contrast. Sagittal and
coronal MPR images reconstructed from axial data set.

[Series 3: head wo · axial · 0.42mm/px · z∈[-134,-14]mm · 7 of 32 slices shown, 9 images]
[im 4/32  brain]
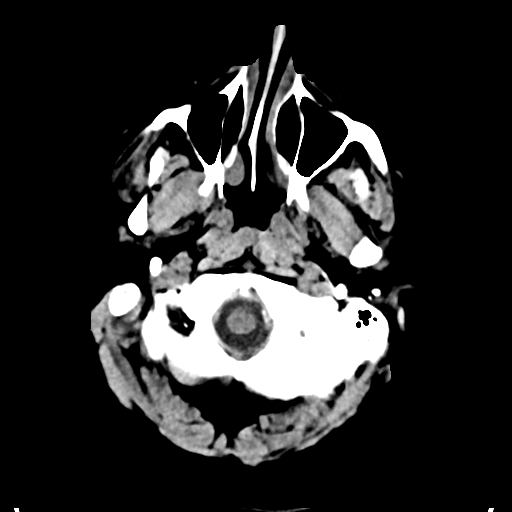
[im 4/32  bone]
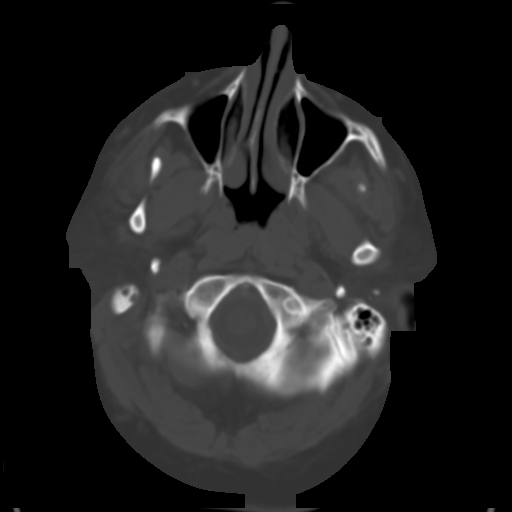
[im 8/32  brain]
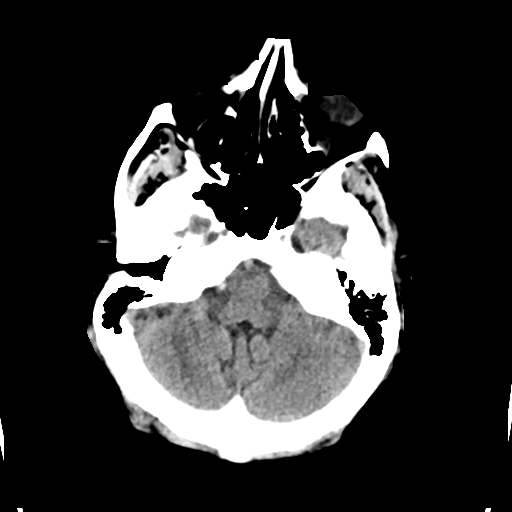
[im 12/32  brain]
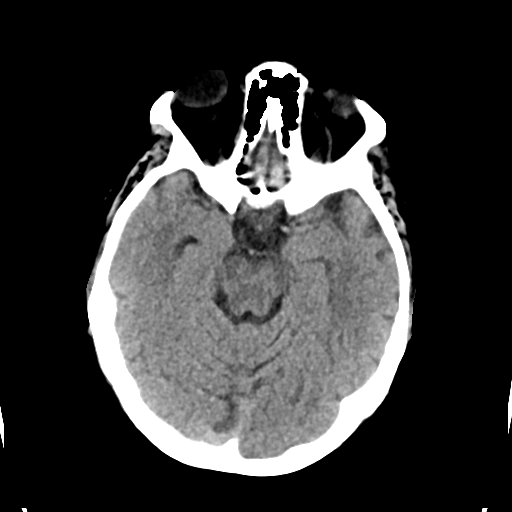
[im 16/32  brain]
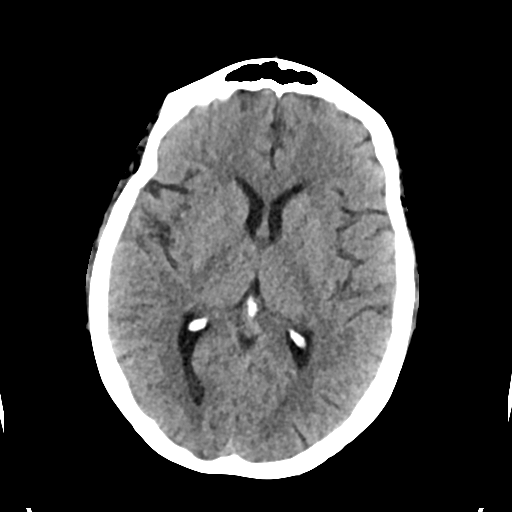
[im 20/32  brain]
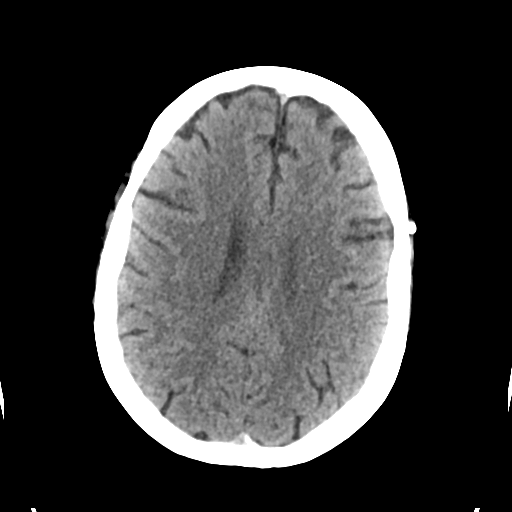
[im 20/32  bone]
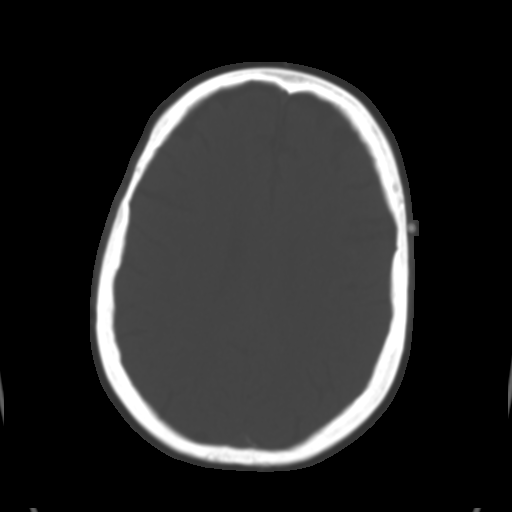
[im 24/32  brain]
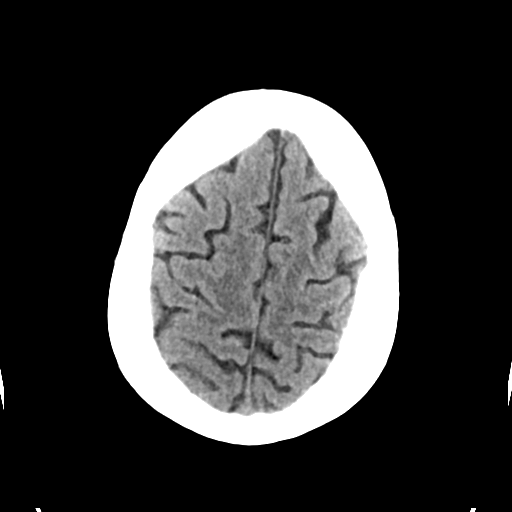
[im 28/32  brain]
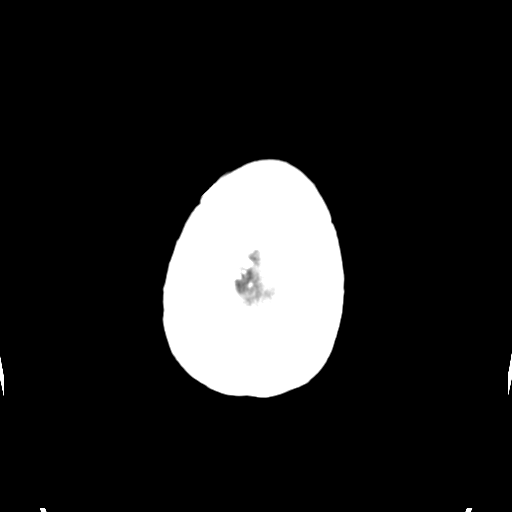

[Series 4: head bone · axial · 0.42mm/px · z∈[-134,-102]mm · 3 of 78 slices shown]
[im 8/78  bone]
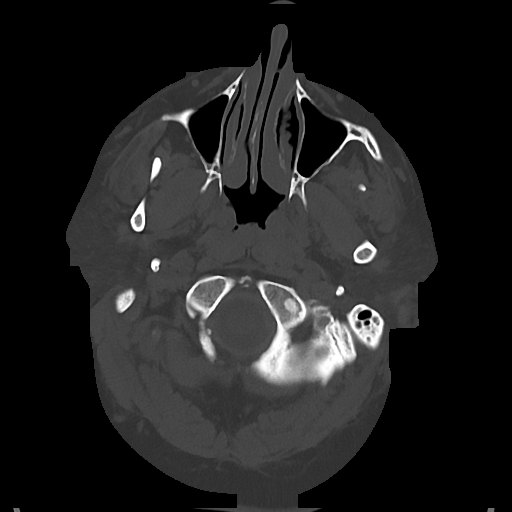
[im 16/78  bone]
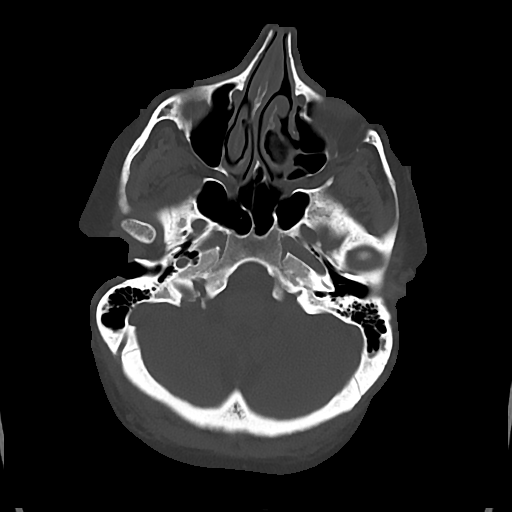
[im 24/78  bone]
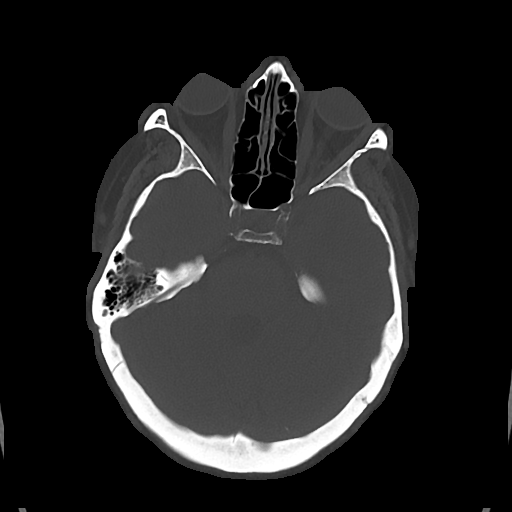

[Series 5: cor soft · coronal · 0.30mm/px · 3 of 67 slices shown]
[im 23/67  brain]
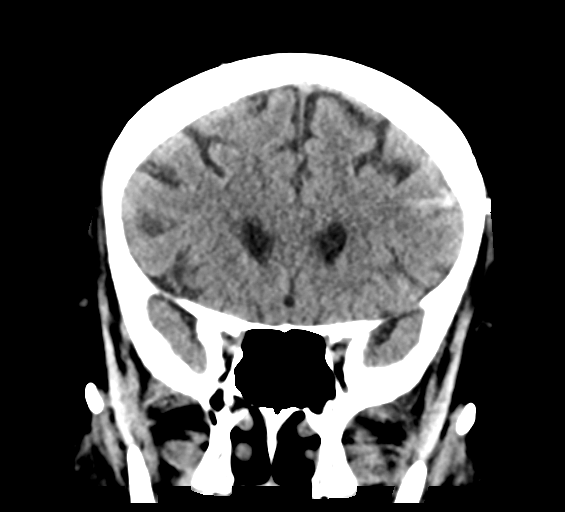
[im 30/67  brain]
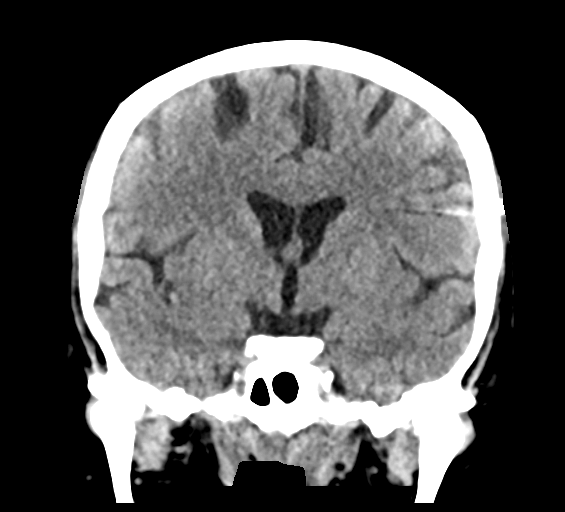
[im 37/67  brain]
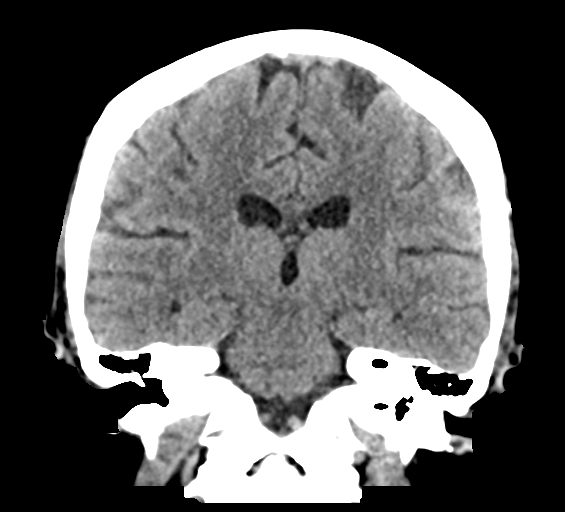

[Series 6: sag soft · sagittal · 0.30mm/px · 3 of 53 slices shown]
[im 18/53  brain]
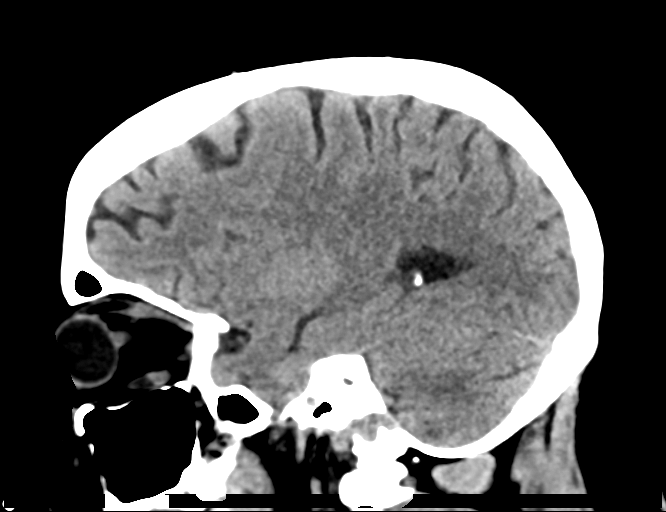
[im 27/53  brain]
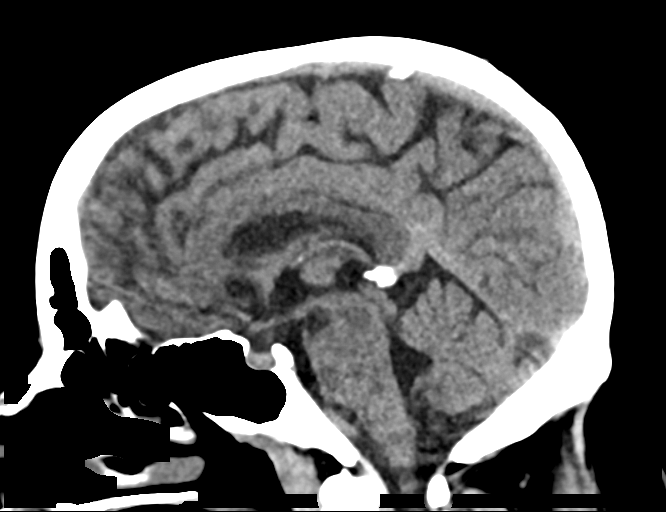
[im 35/53  brain]
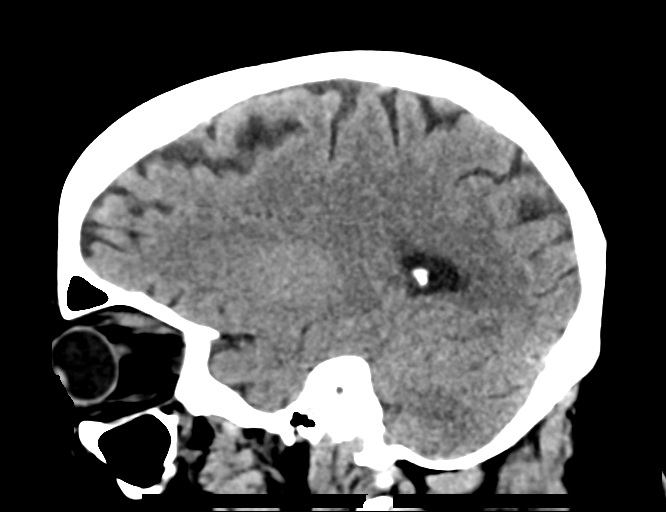

[16 of 47 positions shown; findings below may reference images not displayed]

FINDINGS: Brain: Mild atrophy. Normal ventricular morphology. No midline shift
or mass effect. Otherwise normal appearance of brain parenchyma. No
intracranial hemorrhage, mass lesion, or evidence acute infarction.
No extra-axial fluid collections.

Vascular: Unremarkable

Skull: Intact. Metallic foreign body/BB identified within the scalp
of the LEFT frontal parietal region laterally.

Sinuses/Orbits: Clear.  Nasal septal deviation to the RIGHT.

Other: N/A
IMPRESSION: No acute intracranial abnormalities.

## 2019-06-18 ENCOUNTER — Other Ambulatory Visit: Payer: Self-pay | Admitting: Cardiology

## 2019-06-19 ENCOUNTER — Encounter: Payer: BC Managed Care – PPO | Attending: Internal Medicine | Admitting: Internal Medicine

## 2019-06-19 ENCOUNTER — Other Ambulatory Visit: Payer: Self-pay

## 2019-06-19 DIAGNOSIS — S61401A Unspecified open wound of right hand, initial encounter: Secondary | ICD-10-CM | POA: Diagnosis not present

## 2019-06-19 DIAGNOSIS — L97211 Non-pressure chronic ulcer of right calf limited to breakdown of skin: Secondary | ICD-10-CM | POA: Insufficient documentation

## 2019-06-19 DIAGNOSIS — I5032 Chronic diastolic (congestive) heart failure: Secondary | ICD-10-CM | POA: Insufficient documentation

## 2019-06-19 DIAGNOSIS — E11622 Type 2 diabetes mellitus with other skin ulcer: Secondary | ICD-10-CM | POA: Diagnosis not present

## 2019-06-19 DIAGNOSIS — I110 Hypertensive heart disease with heart failure: Secondary | ICD-10-CM | POA: Insufficient documentation

## 2019-06-19 DIAGNOSIS — T5491XA Toxic effect of unspecified corrosive substance, accidental (unintentional), initial encounter: Secondary | ICD-10-CM | POA: Diagnosis not present

## 2019-06-19 DIAGNOSIS — J449 Chronic obstructive pulmonary disease, unspecified: Secondary | ICD-10-CM | POA: Diagnosis not present

## 2019-06-19 DIAGNOSIS — E11319 Type 2 diabetes mellitus with unspecified diabetic retinopathy without macular edema: Secondary | ICD-10-CM | POA: Diagnosis not present

## 2019-06-19 DIAGNOSIS — F17200 Nicotine dependence, unspecified, uncomplicated: Secondary | ICD-10-CM | POA: Diagnosis not present

## 2019-06-19 DIAGNOSIS — I890 Lymphedema, not elsewhere classified: Secondary | ICD-10-CM | POA: Insufficient documentation

## 2019-06-19 DIAGNOSIS — Z86718 Personal history of other venous thrombosis and embolism: Secondary | ICD-10-CM | POA: Insufficient documentation

## 2019-06-19 DIAGNOSIS — L97212 Non-pressure chronic ulcer of right calf with fat layer exposed: Secondary | ICD-10-CM | POA: Diagnosis not present

## 2019-06-19 DIAGNOSIS — I2510 Atherosclerotic heart disease of native coronary artery without angina pectoris: Secondary | ICD-10-CM | POA: Insufficient documentation

## 2019-06-19 DIAGNOSIS — Z8673 Personal history of transient ischemic attack (TIA), and cerebral infarction without residual deficits: Secondary | ICD-10-CM | POA: Insufficient documentation

## 2019-06-19 DIAGNOSIS — S61200A Unspecified open wound of right index finger without damage to nail, initial encounter: Secondary | ICD-10-CM | POA: Diagnosis not present

## 2019-06-19 DIAGNOSIS — Z794 Long term (current) use of insulin: Secondary | ICD-10-CM | POA: Diagnosis not present

## 2019-06-19 DIAGNOSIS — S61402A Unspecified open wound of left hand, initial encounter: Secondary | ICD-10-CM | POA: Insufficient documentation

## 2019-06-19 DIAGNOSIS — G4733 Obstructive sleep apnea (adult) (pediatric): Secondary | ICD-10-CM | POA: Diagnosis not present

## 2019-06-19 DIAGNOSIS — E1142 Type 2 diabetes mellitus with diabetic polyneuropathy: Secondary | ICD-10-CM | POA: Diagnosis not present

## 2019-06-19 DIAGNOSIS — S61202A Unspecified open wound of right middle finger without damage to nail, initial encounter: Secondary | ICD-10-CM | POA: Diagnosis not present

## 2019-06-20 NOTE — Progress Notes (Signed)
ALAZA, GOLSON ([id number]) Visit Report for 06/19/2019 Abuse/Suicide Risk Screen Details Patient Name: [name], [name] Date of Service: 06/19/2019 9:45 AM Medical Record Number: [id number] Patient Account Number: [id number] Date of Birth/Sex: 06-06-1971 (48 y.o. F) Treating RN: Harold Barban Primary Care Aunika Kirsten: Alma Friendly Other Clinician: Referring Stephan Nelis: Alma Friendly Treating Violet Cart/Extender: Tito Dine in Treatment: 0 Abuse/Suicide Risk Screen Items Answer ABUSE RISK SCREEN: Has anyone close to you tried to hurt or harm you recentlyo No Do you feel uncomfortable with anyone in your familyo No Has anyone forced you do things that you didnot want to doo No Electronic Signature(s) Signed: 06/20/2019 4:28:48 PM By: Harold Barban Entered By: Harold Barban on 06/19/2019 10:13:57 Cavallero, [name] ([id number]) -------------------------------------------------------------------------------- Activities of Daily Living Details Patient Name: [name] Date of Service: 06/19/2019 9:45 AM Medical Record Number: [id number] Patient Account Number: [id number] Date of Birth/Sex: 1971/02/23 (48 y.o. F) Treating RN: Harold Barban Primary Care Azriel Dancy: Alma Friendly Other Clinician: Referring Lucresha Dismuke: Alma Friendly Treating Beonka Amesquita/Extender: Tito Dine in Treatment: 0 Activities of Daily Living Items Answer Activities of Daily Living (Please select one for each item) Drive Automobile Not Able Take Medications Completely Able Use Telephone Completely Able Care for Appearance Completely Able Use Toilet Completely Able Bath / Shower Need Assistance Dress Self Completely Able Feed Self Completely Able Walk Need Assistance Get In / Out Bed Completely Able Housework Not Able Prepare Meals Need [place] for Self Not Able Electronic Signature(s) Signed: 06/20/2019 4:28:48 PM By: Harold Barban Entered  By: Harold Barban on 06/19/2019 10:14:43 [name] ([id number]) -------------------------------------------------------------------------------- Education Screening Details Patient Name: [name] Date of Service: 06/19/2019 9:45 AM Medical Record Number: [id number] Patient Account Number: [id number] Date of Birth/Sex: 1971-01-10 (48 y.o. F) Treating RN: Harold Barban Primary Care Oksana Deberry: Alma Friendly Other Clinician: Referring Rivka Baune: Alma Friendly Treating Mohmmad Saleeby/Extender: Tito Dine in Treatment: 0 Primary Learner Assessed: Patient Learning Preferences/Education Level/Primary Language Learning Preference: Explanation Highest Education Level: High School Preferred Language: English Cognitive Barrier Language Barrier: No Translator Needed: No Memory Deficit: No Emotional Barrier: No Cultural/Religious Beliefs Affecting Medical Care: No Physical Barrier Impaired Vision: No Impaired Hearing: No Decreased Hand dexterity: No Knowledge/Comprehension Knowledge Level: High Comprehension Level: High Ability to understand written High instructions: Ability to understand verbal High instructions: Motivation Anxiety Level: Calm Cooperation: Cooperative Education Importance: Acknowledges Need Interest in Health Problems: Asks Questions Perception: Coherent Willingness to Engage in Self- High Management Activities: Readiness to Engage in Self- High Management Activities: Electronic Signature(s) Signed: 06/20/2019 4:28:48 PM By: Harold Barban Entered By: Harold Barban on 06/19/2019 10:15:12 [place], [name] ([id number]) -------------------------------------------------------------------------------- Fall Risk Assessment Details Patient Name: [name] Date of Service: 06/19/2019 9:45 AM Medical Record Number: [id number] Patient Account Number: [id number] Date of Birth/Sex: [birth year] (48 y.o. F) Treating RN: Harold Barban Primary  Care Dawnielle Christiana: Alma Friendly Other Clinician: Referring Shenequa Howse: Alma Friendly Treating Forest Redwine/Extender: Tito Dine in Treatment: 0 Fall Risk Assessment Items Have you had 2 or more falls in the last 12 monthso 0 Yes Have you had any fall that resulted in injury in the last 12 monthso 0 Yes FALLS RISK SCREEN History of falling - immediate or within 3 months 25 Yes Secondary diagnosis (Do you have 2 or more medical diagnoseso) 15 Yes Ambulatory aid None/bed rest/wheelchair/nurse 0 No Crutches/cane/walker 15 Yes Furniture 0 No Intravenous therapy Access/Saline/Heparin Lock 0 No Gait/Transferring Normal/ bed rest/ wheelchair 0 No Weak (short steps with or without shuffle, stooped but able  to lift head while 10 Yes walking, may seek support from furniture) Impaired (short steps with shuffle, may have difficulty arising from chair, head 0 No down, impaired balance) Mental Status Oriented to own ability 0 No Electronic Signature(s) Signed: 06/20/2019 4:28:48 PM By: Harold Barban Entered By: Harold Barban on 06/19/2019 10:18:24 Jeppsen, [name] ([id number]) -------------------------------------------------------------------------------- Foot Assessment Details Patient Name: [name] Date of Service: 06/19/2019 9:45 AM Medical Record Number: [id number] Patient Account Number: [id number] Date of Birth/Sex: 10-Oct-1970 (48 y.o. F) Treating RN: Harold Barban Primary Care Shivali Quackenbush: Alma Friendly Other Clinician: Referring Everlie Eble: Alma Friendly Treating Erika Slaby/Extender: Tito Dine in Treatment: 0 Foot Assessment Items Site Locations + = Sensation present, - = Sensation absent, C = Callus, U = Ulcer R = Redness, W = Warmth, M = Maceration, PU = Pre-ulcerative lesion F = Fissure, S = Swelling, D = Dryness Assessment Right: Left: Other Deformity: No No Prior Foot Ulcer: No No Prior Amputation: No No Charcot Joint: No No Ambulatory  Status: Ambulatory With Help Assistance Device: Wheelchair Gait: Buyer, retail Signature(s) Signed: 06/20/2019 4:28:48 PM By: Harold Barban Entered By: Harold Barban on 06/19/2019 10:21:23 [name] -------------------------------------------------------------------------------- Nutrition Risk Screening Details Patient Name: [name] Date of Service: 06/19/2019 9:45 AM Medical Record Number: [id number] Patient Account Number: [id number] Date of Birth/Sex: 1971/04/22 (48 y.o. F) Treating RN: Harold Barban Primary Care Caoilainn Sacks: Alma Friendly Other Clinician: Referring Tawna Alwin: Alma Friendly Treating Jayci Ellefson/Extender: Tito Dine in Treatment: 0 Height (in): 64 Weight (lbs): 286 Body Mass Index (BMI): 49.1 Nutrition Risk Screening Items Score Screening NUTRITION RISK SCREEN: I have an illness or condition that made me change the kind and/or amount of 0 No food I eat I eat fewer than two meals per day 0 No I eat few fruits and vegetables, or milk products 0 No I have three or more drinks of beer, liquor or wine almost every day 0 No I have tooth or mouth problems that make it hard for me to eat 0 No I don't always have enough money to buy the food I need 0 No I eat alone most of the time 0 No I take three or more different prescribed or over-the-counter drugs a day 1 Yes Without wanting to, I have lost or gained 10 pounds in the last six months 0 No I am not always physically able to shop, cook and/or feed myself 0 No Nutrition Protocols Good Risk Protocol Moderate Risk Protocol High Risk Proctocol Risk Level: Good Risk Score: 1 Electronic Signature(s) Signed: 06/20/2019 4:28:48 PM By: Harold Barban Entered By: Harold Barban on 06/19/2019 10:18:38

## 2019-06-20 NOTE — Progress Notes (Signed)
IRIE, SATCHELL ([id number]) Visit Report for 06/19/2019 Chief Complaint Document Details Patient Name: [name], [name] Date of Service: 06/19/2019 9:45 AM Medical Record Number: [id number] Patient Account Number: [id number] Date of Birth/Sex: 11-24-1970 (48 y.o. F) Treating RN: [name] Primary Care Provider: Alma Foster Other Clinician: Referring Provider: Alma Foster Treating Provider/Extender: [name] in Treatment: 0 Information Obtained from: Patient Chief Complaint 06/19/2019; patient is here for review of wounds on her bilateral lower legs and bilateral hands Electronic Signature(s) Signed: 06/19/2019 6:09:55 PM By: Nancy Ham MD Entered By: [name] on 06/19/2019 12:51:41 [name] -------------------------------------------------------------------------------- Debridement Details Patient Name: [name] Date of Service: 06/19/2019 9:45 AM Medical Record Number: [id number] Patient Account Number: [id number] Date of Birth/Sex: Jan 19, 1971 (48 y.o. F) Treating RN: [name] Primary Care Provider: Alma Foster Other Clinician: Referring Provider: Alma Foster Treating Provider/Extender: [name] in Treatment: 0 Debridement Performed for Wound #2 Right,Medial Lower Leg Assessment: Performed By: Physician Nancy Dillon, MD Debridement Type: Debridement Severity of Tissue Pre Fat layer exposed Debridement: Level of Consciousness (Pre- Awake and Alert procedure): Pre-procedure Verification/Time Yes - 10:50 Out Taken: Start Time: 10:50 Pain Control: Lidocaine Total Area Debrided (L x W): 0.8 (cm) x 0.5 (cm) = 0.4 (cm) Tissue and other material Viable, Eschar, Subcutaneous debrided: Level: Skin/Subcutaneous Tissue Debridement Description: Excisional Instrument: Curette Bleeding: Minimum Hemostasis Achieved: Pressure End Time: 10:53 Response to Treatment: Procedure was tolerated  well Level of Consciousness Awake and Alert (Post-procedure): Post Debridement Measurements of Total Wound Length: (cm) 0.8 Width: (cm) 0.5 Depth: (cm) 0.2 Volume: (cm) 0.063 Character of Wound/Ulcer Post Debridement: Stable Severity of Tissue Post Debridement: Fat layer exposed Post Procedure Diagnosis Same as Pre-procedure Electronic Signature(s) Signed: 06/19/2019 5:38:26 PM By: [name], BSN, RN, CWS, Kim RN, BSN Signed: 06/19/2019 6:09:55 PM By: Nancy Ham MD Entered By: [name] on 06/19/2019 12:49:27 [name] -------------------------------------------------------------------------------- Debridement Details Patient Name: [name] Date of Service: 06/19/2019 9:45 AM Medical Record Number: [id number] Patient Account Number: [id number] Date of Birth/Sex: 09-Mar-1971 (48 y.o. F) Treating RN: [name] Primary Care Provider: Alma Foster Other Clinician: Referring Provider: Alma Foster Treating Provider/Extender: [name] in Treatment: 0 Debridement Performed for Wound #4 Right Hand - 2nd Digit Assessment: Performed By: Physician Nancy Dillon, MD Debridement Type: Debridement Level of Consciousness (Pre- Awake and Alert procedure): Pre-procedure Verification/Time Yes - 10:50 Out Taken: Start Time: 10:50 Pain Control: Lidocaine Total Area Debrided (L x W): 0.6 (cm) x 0.5 (cm) = 0.3 (cm) Tissue and other material Viable, Eschar, Subcutaneous debrided: Level: Skin/Subcutaneous Tissue Debridement Description: Excisional Instrument: Curette Bleeding: Minimum Hemostasis Achieved: Pressure End Time: 10:57 Response to Treatment: Procedure was tolerated well Level of Consciousness Awake and Alert (Post-procedure): Post Debridement Measurements of Total Wound Length: (cm) 0.6 Width: (cm) 0.5 Depth: (cm) 0.6 Volume: (cm) 0.141 Character of Wound/Ulcer Post Debridement: Stable Post Procedure  Diagnosis Same as Pre-procedure Electronic Signature(s) Signed: 06/19/2019 5:38:26 PM By: [name], BSN, RN, CWS, Kim RN, BSN Signed: 06/19/2019 6:09:55 PM By: Nancy Ham MD Entered By: [name] on 06/19/2019 12:51:01 [name] -------------------------------------------------------------------------------- Debridement Details Patient Name: [name] Date of Service: 06/19/2019 9:45 AM Medical Record Number: [id number] Patient Account Number: [id number] Date of Birth/Sex: 26-Dec-1970 (48 y.o. F) Treating RN: [name] Primary Care Provider: Alma Foster Other Clinician: Referring Provider: Alma Foster Treating Provider/Extender: [name] in Treatment: 0 Debridement Performed for Wound #5 Right Hand - 3rd Digit Assessment: Performed By: Physician  Nancy Dillon, MD Debridement Type: Debridement Level of Consciousness (Pre- Awake and Alert procedure): Pre-procedure Verification/Time Yes - 10:50 Out Taken: Start Time: 10:50 Pain Control: Lidocaine Total Area Debrided (L x W): 0.8 (cm) x 0.9 (cm) = 0.72 (cm) Tissue and other material Viable, Eschar, Subcutaneous debrided: Level: Skin/Subcutaneous Tissue Debridement Description: Excisional Instrument: Curette Bleeding: Minimum Hemostasis Achieved: Pressure End Time: 10:55 Response to Treatment: Procedure was tolerated well Level of Consciousness Awake and Alert (Post-procedure): Post Debridement Measurements of Total Wound Length: (cm) 0.8 Width: (cm) 0.9 Depth: (cm) 0.4 Volume: (cm) 0.226 Character of Wound/Ulcer Post Debridement: Stable Post Procedure Diagnosis Same as Pre-procedure Electronic Signature(s) Signed: 06/19/2019 5:38:26 PM By: [name], BSN, RN, CWS, Kim RN, BSN Signed: 06/19/2019 6:09:55 PM By: Nancy Ham MD Entered By: [name] on 06/19/2019 12:51:12 [name] -------------------------------------------------------------------------------- HPI Details Patient Name: [name] Date of Service: 06/19/2019 9:45 AM Medical Record Number: [id number] Patient Account Number: [id number] Date of Birth/Sex: January 26, 1971 (48 y.o. F) Treating RN: [name] Primary Care Provider: Alma Foster Other Clinician: Referring Provider: Alma Foster Treating Provider/Extender: [name] in Treatment: 0 History of Present Illness HPI Description: ADMISSION 06/19/2019 Patient is a 48 year old woman who is accompanied by her husband. She has severe diabetic peripheral neuropathy which is left her minimally ambulatory. She spends most of the time in a wheelchair. She also has a history of chronic lower extremity edema history of left leg DVT. She states that on 2 separate occasions roughly a month ago she fell with lacerations on the bilateral anterior tibial areas. She was seen by primary care on 06/04/2019 diagnosed with cellulitis of the left leg put on Bactrim DS. Also noted to have hand wounds with a question of referral to hand surgery. She was also sent to our clinic at the same time. The patient's husband is doing the dressings. He has been using silver alginate that was supplied by home health. He is done a really good job the area on the left leg is actually healed only a small superficial area on the right Somewhere in the in the last 2 weeks she gives a bizarre history of going to wash nail polish off the fingers of both hands. She apparently did not realize that her son had put Clorox in the vinegar jar that they used to clean the countertop. She ended up burning her hands. The patient has a wound on the PIP of the left third finger, PIP of the right second finger and the lateral part of the right third finger Past medical history includes COPD, obstructive sleep apnea on nocturnal O2, type 2 diabetes with peripheral  neuropathy, retinopathy, chronic diastolic heart failure coronary artery disease, severe diabetic neuropathy type 2 diabetes apparently diagnosed at age 36 on insulin, COPD, continued tobacco abuse, left leg deep DVT, hypertension, TIAs, seizure disorders, asthma The patient is actually had arterial studies in February of this year. ABIs bilaterally were 1.02 and TBI's bilaterally at 0.6. Waveforms were triphasic and biphasic. She was not felt to have significant arterial disease Electronic Signature(s) Signed: 06/19/2019 6:09:55 PM By: Nancy Ham MD Entered By: [name] on 06/19/2019 12:56:38 [place], [name] -------------------------------------------------------------------------------- Physical Exam Details Patient Name: [name] Date of Service: 06/19/2019 9:45 AM Medical Record Number: [id number] Patient Account Number: [id number] Date of Birth/Sex: 03-31-1971 (48 y.o. F) Treating RN: [name] Primary Care Provider: Alma Foster Other Clinician: Referring Provider: Alma Foster Treating Provider/Extender: [name] Weeks in Treatment: 0 Constitutional  Sitting or standing Blood Pressure is within target range for patient.. Pulse regular and within target range for patient.Marland Kitchen Respirations regular, non-labored and within target range.. Temperature is normal and within the target range for the patient.Marland Kitchen appears in no distress. Eyes Conjunctivae clear. No discharge. Respiratory Respiratory effort is easy and symmetric bilaterally. Rate is normal at rest and on room air.. Bilateral breath sounds are clear and equal in all lobes with no wheezes, rales or rhonchi.. Cardiovascular Heart rhythm and rate regular, without murmur or gallop. No evidence of CHF. Femoral pulses palpable. Referral pulses were easily palpable. She has tense nonpitting edema in both lower extremities. I suspect this is lymphedema. Gastrointestinal (GI) Abdomen is soft and  non-distended without masses or tenderness. Bowel sounds active in all quadrants.. Lymphatic Palpable in the popliteal area bilaterally. Neurological Complete diabetic insensate neuropathy. Psychiatric No evidence of depression, anxiety, or agitation. Calm, cooperative, and communicative. Appropriate interactions and affect.. Notes Wound exam oThe area which was apparently was quite substantial per the patient and her husband on the left anterior tibial area is completely closed oShe has a very tiny eschared area that was debrided with a #3 curette on the right medial calf. Reasonably clean superficial wound remaining. oThe 3 areas on her fingers are all very similar. Small eschared areas which were debrided with a #3 curette I am able to get to healthy looking tissue however there is depth to each 1 of those. I am doubtful that these represent burn injuries at all at least not from bleach. I do not think these are primary skin disorders as well Electronic Signature(s) Signed: 06/19/2019 6:09:55 PM By: Nancy Ham MD Entered By: [name] on 06/19/2019 12:59:22 [place], [name] -------------------------------------------------------------------------------- Physician Orders Details Patient Name: [name] Date of Service: 06/19/2019 9:45 AM Medical Record Number: [id number] Patient Account Number: [id number] Date of Birth/Sex: [birth year] (48 y.o. F) Treating RN: [name] Primary Care Provider: Alma Foster Other Clinician: Referring Provider: Alma Foster Treating Provider/Extender: [name] in Treatment: 0 Verbal / Phone Orders: No Diagnosis Coding Wound Cleansing Wound #2 Right,Medial Lower Leg o Clean wound with Normal Saline. o May Shower, gently pat wound dry prior to applying new dressing. Wound #3 Left Hand - 3rd Digit o Clean wound with Normal Saline. o May Shower, gently pat wound dry prior to applying new  dressing. Wound #4 Right Hand - 2nd Digit o Clean wound with Normal Saline. o May Shower, gently pat wound dry prior to applying new dressing. Wound #5 Right Hand - 3rd Digit o Clean wound with Normal Saline. o May Shower, gently pat wound dry prior to applying new dressing. Anesthetic (add to Medication List) Wound #2 Right,Medial Lower Leg o Topical Lidocaine 4% cream applied to wound bed prior to debridement (In Clinic Only). Wound #3 Left Hand - 3rd Digit o Topical Lidocaine 4% cream applied to wound bed prior to debridement (In Clinic Only). Wound #4 Right Hand - 2nd Digit o Topical Lidocaine 4% cream applied to wound bed prior to debridement (In Clinic Only). Wound #5 Right Hand - 3rd Digit o Topical Lidocaine 4% cream applied to wound bed prior to debridement (In Clinic Only). Primary Wound Dressing Wound #2 Right,Medial Lower Leg o Silver Collagen - moistened with saline Wound #3 Left Hand - 3rd Digit o Silver Collagen - moistened with saline Wound #4 Right Hand - 2nd Digit o Silver Collagen - moistened with saline Wound #5 Right Hand - 3rd Digit o Silver Collagen -  moistened with saline Secondary Dressing Vavra, Ottilia ([id number]) Wound #2 Right,Medial Lower Leg o Boardered Foam Dressing Wound #3 Left Hand - 3rd Digit o Non-adherent pad - Conform and stretch-net to secure Wound #4 Right Hand - 2nd Digit o Non-adherent pad - Conform and stretch-net to secure Wound #5 Right Hand - 3rd Digit o Non-adherent pad - Conform and stretch-net to secure Dressing Change Frequency Wound #2 Right,Medial Lower Leg o Change Dressing Monday, Wednesday, Friday Wound #3 Left Hand - 3rd Digit o Change Dressing Monday, Wednesday, Friday Wound #4 Right Hand - 2nd Digit o Change Dressing Monday, Wednesday, Friday Wound #5 Right Hand - 3rd Digit o Change Dressing Monday, Wednesday, Friday Follow-up Appointments Wound #2 Right,Medial Lower  Leg o Return Appointment in 1 week. Wound #3 Left Hand - 3rd Digit o Return Appointment in 1 week. Wound #4 Right Hand - 2nd Digit o Return Appointment in 1 week. Wound #5 Right Hand - 3rd Digit o Return Appointment in 1 week. Edema Control Wound #2 Right,Medial Lower Leg o Elevate legs to the level of the heart and pump ankles as often as possible Home Health Wound #2 Right,Medial Lower Leg o [place] Nurse may visit PRN to address patientos wound care needs. o FACE TO FACE ENCOUNTER: MEDICARE and MEDICAID PATIENTS: I certify that this patient is under my care and that I had a face-to-face encounter that meets the physician face-to-face encounter requirements with this patient on this date. The encounter with the patient was in whole or in part for the following MEDICAL CONDITION: (primary reason for [place]) MEDICAL NECESSITY: I certify, that based on my findings, NURSING services are a medically necessary home health service. HOME BOUND STATUS: I certify that my clinical findings support that this patient is homebound (i.e., Due to illness or injury, pt requires aid of supportive devices such as crutches, cane, wheelchairs, walkers, the use of special transportation or the assistance of another person to leave their place of residence. There is a normal inability to leave the home and doing so requires considerable and taxing effort. Other absences are for medical reasons / religious services and are infrequent or of short duration when for other reasons). [place], [name] ([id number]) o If current dressing causes regression in wound condition, may D/C ordered dressing product/s and apply Normal Saline Moist Dressing daily until next [place] / Other MD appointment. [place] of regression in wound condition at [phone]. o Please direct any NON-WOUND related issues/requests  for orders to patient's Primary Care Physician Wound #3 Left Hand - 3rd Digit o [place] Nurse may visit PRN to address patientos wound care needs. o FACE TO FACE ENCOUNTER: MEDICARE and MEDICAID PATIENTS: I certify that this patient is under my care and that I had a face-to-face encounter that meets the physician face-to-face encounter requirements with this patient on this date. The encounter with the patient was in whole or in part for the following MEDICAL CONDITION: (primary reason for [place]) MEDICAL NECESSITY: I certify, that based on my findings, NURSING services are a medically necessary home health service. HOME BOUND STATUS: I certify that my clinical findings support that this patient is homebound (i.e., Due to illness or injury, pt requires aid of supportive devices such as crutches, cane, wheelchairs, walkers, the use of special transportation or the assistance of another person to leave their place of residence.  There is a normal inability to leave the home and doing so requires considerable and taxing effort. Other absences are for medical reasons / religious services and are infrequent or of short duration when for other reasons). o If current dressing causes regression in wound condition, may D/C ordered dressing product/s and apply Normal Saline Moist Dressing daily until next [place] / Other MD appointment. [place] of regression in wound condition at [phone]. o Please direct any NON-WOUND related issues/requests for orders to patient's Primary Care Physician Wound #4 Right Hand - 2nd Digit o [place] Nurse may visit PRN to address patientos wound care needs. o FACE TO FACE ENCOUNTER: MEDICARE and MEDICAID PATIENTS: I certify that this patient is under my care and that I had a face-to-face encounter that meets  the physician face-to-face encounter requirements with this patient on this date. The encounter with the patient was in whole or in part for the following MEDICAL CONDITION: (primary reason for [place]) MEDICAL NECESSITY: I certify, that based on my findings, NURSING services are a medically necessary home health service. HOME BOUND STATUS: I certify that my clinical findings support that this patient is homebound (i.e., Due to illness or injury, pt requires aid of supportive devices such as crutches, cane, wheelchairs, walkers, the use of special transportation or the assistance of another person to leave their place of residence. There is a normal inability to leave the home and doing so requires considerable and taxing effort. Other absences are for medical reasons / religious services and are infrequent or of short duration when for other reasons). o If current dressing causes regression in wound condition, may D/C ordered dressing product/s and apply Normal Saline Moist Dressing daily until next [place] / Other MD appointment. [place] of regression in wound condition at [phone]. o Please direct any NON-WOUND related issues/requests for orders to patient's Primary Care Physician Wound #5 Right Hand - 3rd Digit o [place] Nurse may visit PRN to address patientos wound care needs. o FACE TO FACE ENCOUNTER: MEDICARE and MEDICAID PATIENTS: I certify that this patient is under my care and that I had a face-to-face encounter that meets the physician face-to-face encounter requirements with this patient on this date. The encounter with the patient was in whole or in part for the following MEDICAL CONDITION: (primary reason for [place]) MEDICAL NECESSITY: I certify, that based on my findings, NURSING services are a medically necessary home health service. HOME BOUND STATUS: I certify  that my clinical findings support that this patient is homebound (i.e., Due to illness or injury, pt requires aid of supportive devices such as crutches, cane, wheelchairs, walkers, the use of special transportation or the assistance of another person to leave their place of residence. There is a normal inability to leave the home and doing so requires considerable and taxing effort. Other absences are for medical reasons / religious services and are infrequent or of short duration when for other reasons). o If current dressing causes regression in wound condition, may D/C ordered dressing product/s and apply Normal Saline Moist Dressing daily until next [place] / Other MD appointment. [place] of regression in wound condition at [phone]. o Please direct any NON-WOUND related issues/requests for orders to patient's Primary Care Physician KENZLEIGH, WEISBRODT ([id number]) Electronic Signature(s) Signed: 06/19/2019 5:38:26 PM By: [name], BSN,  RN, CWS, Kim RN, BSN Signed: 06/19/2019 6:09:55 PM By: Nancy Ham MD Entered By: [name], BSN, RN, CWS, Kim on 06/19/2019 11:02:56 [place], [name] -------------------------------------------------------------------------------- Problem List Details Patient Name: [name] Date of Service: 06/19/2019 9:45 AM Medical Record Number: [id number] Patient Account Number: [id number] Date of Birth/Sex: [birth year] (48 y.o. 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 06/19/2019 No Yes of skin L97.821 Non-pressure chronic ulcer of other part of left lower leg 06/19/2019 No Yes limited to breakdown of skin I89.0  Lymphedema, not elsewhere classified 06/19/2019 No Yes E11.622 Type 2 diabetes mellitus with other skin ulcer 06/19/2019 No Yes S61.401D Unspecified open wound of right hand, subsequent encounter 06/19/2019 No Yes S61.402D Unspecified open wound of left hand, subsequent encounter 06/19/2019 No Yes Inactive Problems Resolved Problems Electronic Signature(s) Signed: 06/19/2019 6:09:55 PM By: Nancy Ham MD Entered By: [name] on 06/19/2019 12:48:57 [place], [name] -------------------------------------------------------------------------------- Progress Note Details Patient Name: [name] Date of Service: 06/19/2019 9:45 AM Medical Record Number: [id number] Patient Account Number: [id number] Date of Birth/Sex: December 18, 1970 (48 y.o. F) Treating RN: [name] Primary Care Provider: Alma Foster Other Clinician: Referring Provider: Alma Foster Treating Provider/Extender: [name] in Treatment: 0 Subjective Chief Complaint Information obtained from Patient 06/19/2019; patient is here for review of wounds on her bilateral lower legs and bilateral hands History of Present Illness (HPI) ADMISSION 06/19/2019 Patient is a 48 year old woman who is accompanied by her husband. She has severe diabetic peripheral neuropathy which is left her minimally ambulatory. She spends most of the time in a wheelchair. She also has a history of chronic lower extremity edema history of left leg DVT. She states that on 2 separate occasions roughly a month ago she fell with lacerations on the bilateral anterior tibial areas. She was seen by primary care on 06/04/2019 diagnosed with cellulitis of the left leg put on Bactrim DS. Also noted to have hand wounds with a question of referral to hand surgery. She was also sent to our clinic at the same time. The patient's husband is doing the dressings. He has been using silver alginate that was supplied by home health. He is done  a really good job the area on the left leg is actually healed only a small superficial area on the right Somewhere in the in the last 2 weeks she gives a bizarre history of going to wash nail polish off the fingers of both hands. She apparently did not realize that her son had put Clorox in the vinegar jar that they used to clean the countertop. She ended up burning her hands. The patient has a wound on the PIP of the left third finger, PIP of the right second finger and the lateral part of the right third finger Past medical history includes COPD, obstructive sleep apnea on nocturnal O2, type 2 diabetes with peripheral neuropathy, retinopathy, chronic diastolic heart failure coronary artery disease, severe diabetic neuropathy type 2 diabetes apparently diagnosed at age 22 on insulin, COPD, continued tobacco abuse, left leg deep DVT, hypertension, TIAs, seizure disorders, asthma The patient is actually had arterial studies in February of this year. ABIs bilaterally were 1.02 and TBI's bilaterally at 0.6. Waveforms were triphasic and biphasic. She was not felt to have significant arterial disease Patient  History Information obtained from Patient. Allergies penicillin (Severity: Moderate, Reaction: rash), Singulair (Severity: Severe, Reaction: tongue swelling), metoprolol, morphine (Severity: Severe, Reaction: face swelling, unable to swallow), prednisone (Severity: Severe, Reaction: throat swelling), diltiazem Family History Cancer - Siblings, Diabetes - Siblings,Father,Mother, Heart Disease - Siblings,Father,Mother, Hypertension - Mother,Father,Siblings, Lung Disease - Mother, Seizures - Mother, Stroke - Mother,Siblings,Father, No family history of Hereditary Spherocytosis, Kidney Disease, Thyroid Problems, Tuberculosis. Social History Current every day smoker - 40 years, Marital Status - Married, Alcohol Use - Never, Drug Use - No History, Caffeine Use - Kearn, Juliet ([id number]) Daily -  coffee, soda. Medical History Eyes Denies history of Cataracts, Glaucoma, Optic Neuritis Ear/Nose/Mouth/Throat Denies history of Chronic sinus problems/congestion, Middle ear problems Hematologic/Lymphatic Denies history of Anemia, Hemophilia, Human Immunodeficiency Virus, Lymphedema, Sickle Cell Disease Respiratory Patient has history of Asthma, Chronic Obstructive Pulmonary Disease (COPD), Sleep Apnea Cardiovascular Patient has history of Arrhythmia - sinus tachycardia, Congestive Heart Failure, Coronary Artery Disease, Hypertension Gastrointestinal Denies history of Cirrhosis , Colitis, Crohn s, Hepatitis A, Hepatitis B, Hepatitis C Endocrine Patient has history of Type II Diabetes Genitourinary Denies history of End Stage Renal Disease Immunological Denies history of Lupus Erythematosus, Raynaud s, Scleroderma Integumentary (Skin) Denies history of History of Burn, History of pressure wounds Musculoskeletal Denies history of Gout, Rheumatoid Arthritis, Osteoarthritis, Osteomyelitis Neurologic Denies history of Dementia, Neuropathy, Quadriplegia, Paraplegia, Seizure Disorder Psychiatric Denies history of Anorexia/bulimia, Confinement Anxiety Patient is treated with Insulin, Oral Agents. Blood sugar is tested. Medical And Surgical History Notes Neurologic Stroke 2020 Left weakness Review of Systems (ROS) Eyes Complains or has symptoms of Glasses / Contacts, Neuropathy behind bilateral eyes Ear/Nose/Mouth/Throat Denies complaints or symptoms of Difficult clearing ears, Sinusitis. Respiratory Denies complaints or symptoms of Chronic or frequent coughs, Shortness of Breath. Gastrointestinal Denies complaints or symptoms of Frequent diarrhea, Nausea, Vomiting. Endocrine Denies complaints or symptoms of Hepatitis, Thyroid disease, Polydypsia (Excessive Thirst). Genitourinary Denies complaints or symptoms of Kidney failure/ Dialysis,  Incontinence/dribbling. Immunological Denies complaints or symptoms of Hives. Integumentary (Skin) Complains or has symptoms of Wounds - 5. Denies complaints or symptoms of Bleeding or bruising tendency, Breakdown, Swelling. Musculoskeletal Complains or has symptoms of Muscle Weakness - legs- neuropathy. Denies complaints or symptoms of Muscle Pain. Neurologic Denies complaints or symptoms of Numbness/parasthesias, Focal/Weakness. Psychiatric Wike, [place] ([id number]) Complains or has symptoms of Anxiety, Claustrophobia. Objective Constitutional Sitting or standing Blood Pressure is within target range for patient.. Pulse regular and within target range for patient.Marland Kitchen Respirations regular, non-labored and within target range.. Temperature is normal and within the target range for the patient.Marland Kitchen appears in no distress. Vitals Time Taken: 9:55 AM, Height: 64 in, Source: Stated, Weight: 286 lbs, Source: Stated, BMI: 49.1, Temperature: 98.1 F, Pulse: 82 bpm, Respiratory Rate: 16 breaths/min, Blood Pressure: 130/63 mmHg. Eyes Conjunctivae clear. No discharge. Respiratory Respiratory effort is easy and symmetric bilaterally. Rate is normal at rest and on room air.. Bilateral breath sounds are clear and equal in all lobes with no wheezes, rales or rhonchi.. Cardiovascular Heart rhythm and rate regular, without murmur or gallop. No evidence of CHF. Femoral pulses palpable. Referral pulses were easily palpable. She has tense nonpitting edema in both lower extremities. I suspect this is lymphedema. Gastrointestinal (GI) Abdomen is soft and non-distended without masses or tenderness. Bowel sounds active in all quadrants.. Lymphatic Palpable in the popliteal area bilaterally. Neurological Complete diabetic insensate neuropathy. Psychiatric No evidence of depression, anxiety, or agitation. Calm, cooperative, and communicative. Appropriate interactions and affect.. General Notes: Wound exam  The area which was apparently was quite substantial per the patient and her husband on the left anterior tibial area is completely closed She has a very tiny eschared area that was debrided with a #3 curette on the right medial calf. Reasonably clean superficial wound remaining. The 3 areas on her fingers are all very similar. Small eschared areas which were debrided with a #3 curette I am able to get to healthy looking tissue however there is depth to each 1 of those. I am doubtful that these represent burn injuries at all at least not from bleach. I do not think these are primary skin disorders as well Integumentary (Hair, Skin) Wound #2 status is Open. Original cause of wound was Trauma. The wound is located on the Right,Medial Lower Leg. The wound measures 0.8cm length x 0.5cm width x 0.1cm depth; 0.314cm^2 area and 0.031cm^3 volume. There is no tunneling or undermining noted. There is a none present amount of drainage noted. The wound margin is thickened. There is no granulation within the wound bed. There is a large (67-100%) amount of necrotic tissue within the wound bed including Eschar. Prins, Monetta ([id number]) Wound #3 status is Open. Original cause of wound was Trauma. The wound is located on the Left Hand - 3rd Digit. The wound measures 0.4cm length x 0.4cm width x 0.4cm depth; 0.126cm^2 area and 0.05cm^3 volume. There is no tunneling or undermining noted. There is a none present amount of drainage noted. The wound margin is thickened. There is no granulation within the wound bed. There is a large (67-100%) amount of necrotic tissue within the wound bed including Eschar. Wound #4 status is Open. Original cause of wound was Trauma. The wound is located on the Right Hand - 2nd Digit. The wound measures 0.6cm length x 0.5cm width x 0.6cm depth; 0.236cm^2 area and 0.141cm^3 volume. There is Fat Layer (Subcutaneous Tissue) Exposed exposed. There is no tunneling or undermining noted.  There is a none present amount of drainage noted. The wound margin is thickened. There is no granulation within the wound bed. There is a large (67-100%) amount of necrotic tissue within the wound bed including Adherent Slough. Wound #5 status is Open. Original cause of wound was Trauma. The wound is located on the Right Hand - 3rd Digit. The wound measures 0.8cm length x 0.9cm width x 0.2cm depth; 0.565cm^2 area and 0.113cm^3 volume. There is Fat Layer (Subcutaneous Tissue) Exposed exposed. There is no tunneling or undermining noted. There is a none present amount of drainage noted. The wound margin is thickened. There is no granulation within the wound bed. There is a large (67-100%) amount of necrotic tissue within the wound bed including Adherent Slough. 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 There was a Excisional Skin/Subcutaneous Tissue Debridement with a total area of 0.4 sq cm performed by Nancy Dillon, MD. With the following instrument(s): Curette to remove Viable tissue/material. Material removed includes Eschar and Subcutaneous Tissue and after achieving pain control using Lidocaine. No specimens were taken. A  time out was conducted at 10:50, prior to the start of the procedure. A Minimum amount of bleeding was controlled with Pressure. The procedure was tolerated well. Post Debridement Measurements: 0.8cm length x 0.5cm width x  0.2cm depth; 0.063cm^3 volume. Character of Wound/Ulcer Post Debridement is stable. Severity of Tissue Post Debridement is: Fat layer exposed. Post procedure Diagnosis Wound #2: Same as Pre-Procedure Wound #4 Pre-procedure diagnosis of Wound #4 is a Trauma, Other located on the Right Hand - 2nd Digit . There was a Excisional Skin/Subcutaneous Tissue Debridement with a total area of 0.3 sq cm performed by Nancy Dillon, MD. With the following instrument(s): Curette to remove Viable tissue/material. Material removed includes Eschar and Subcutaneous Tissue and after achieving pain control using Lidocaine. No specimens were taken. A time out was conducted at 10:50, prior to the start of the procedure. A Minimum amount of bleeding was controlled with Pressure. The procedure was tolerated well. Thul, Georgina ([id number]) Post Debridement Measurements: 0.6cm length x 0.5cm width x 0.6cm depth; 0.141cm^3 volume. Character of Wound/Ulcer Post Debridement is stable. Post procedure Diagnosis Wound #4: Same as Pre-Procedure Wound #5 Pre-procedure diagnosis of Wound #5 is a Trauma, Other located on the Right Hand - 3rd Digit . There was a Excisional Skin/Subcutaneous Tissue Debridement with a total area of 0.72 sq cm performed by Nancy Dillon, MD. With the following instrument(s): Curette to remove Viable tissue/material. Material removed includes Eschar and Subcutaneous Tissue and after achieving pain control using Lidocaine. No specimens were taken. A time out was conducted at 10:50, prior to the start of the procedure. A Minimum amount of bleeding was controlled with Pressure. The procedure was tolerated well. Post Debridement Measurements: 0.8cm length x 0.9cm width x 0.4cm depth; 0.226cm^3 volume. Character of Wound/Ulcer Post Debridement is stable. Post procedure Diagnosis Wound #5: Same as Pre-Procedure Plan Wound Cleansing: Wound #2 Right,Medial Lower Leg: Clean wound with Normal  Saline. May Shower, gently pat wound dry prior to applying new dressing. Wound #3 Left Hand - 3rd Digit: Clean wound with Normal Saline. May Shower, gently pat wound dry prior to applying new dressing. Wound #4 Right Hand - 2nd Digit: Clean wound with Normal Saline. May Shower, gently pat wound dry prior to applying new dressing. Wound #5 Right Hand - 3rd Digit: Clean wound with Normal Saline. May Shower, gently pat wound dry prior to applying new dressing. Anesthetic (add to Medication List): Wound #2 Right,Medial Lower Leg: Topical Lidocaine 4% cream applied to wound bed prior to debridement (In Clinic Only). Wound #3 Left Hand - 3rd Digit: Topical Lidocaine 4% cream applied to wound bed prior to debridement (In Clinic Only). Wound #4 Right Hand - 2nd Digit: Topical Lidocaine 4% cream applied to wound bed prior to debridement (In Clinic Only). Wound #5 Right Hand - 3rd Digit: Topical Lidocaine 4% cream applied to wound bed prior to debridement (In Clinic Only). Primary Wound Dressing: Wound #2 Right,Medial Lower Leg: Silver Collagen - moistened with saline Wound #3 Left Hand - 3rd Digit: Silver Collagen - moistened with saline Wound #4 Right Hand - 2nd Digit: Silver Collagen - moistened with saline Wound #5 Right Hand - 3rd Digit: Silver Collagen - moistened with saline Secondary Dressing: Wound #2 Right,Medial Lower Leg: Boardered Foam Dressing Wound #3 Left Hand - 3rd Digit: Non-adherent pad - Conform and stretch-net to secure Wound #4 Right Hand - 2nd Digit: Ickes, Audi ([id number]) Non-adherent pad - Conform and stretch-net to secure Wound #5 Right Hand - 3rd Digit:  Non-adherent pad - Conform and stretch-net to secure Dressing Change Frequency: Wound #2 Right,Medial Lower Leg: Change Dressing Monday, Wednesday, Friday Wound #3 Left Hand - 3rd Digit: Change Dressing Monday, Wednesday, Friday Wound #4 Right Hand - 2nd Digit: Change Dressing Monday, Wednesday,  Friday Wound #5 Right Hand - 3rd Digit: Change Dressing Monday, Wednesday, Friday Follow-up Appointments: Wound #2 Right,Medial Lower Leg: Return Appointment in 1 week. Wound #3 Left Hand - 3rd Digit: Return Appointment in 1 week. Wound #4 Right Hand - 2nd Digit: Return Appointment in 1 week. Wound #5 Right Hand - 3rd Digit: Return Appointment in 1 week. Edema Control: Wound #2 Right,Medial Lower Leg: Elevate legs to the level of the heart and pump ankles as often as possible Home Health: Wound #2 Right,Medial Lower Leg: [place] Nurse may visit PRN to address patient s wound care needs. FACE TO FACE ENCOUNTER: MEDICARE and MEDICAID PATIENTS: I certify that this patient is under my care and that I had a face-to-face encounter that meets the physician face-to-face encounter requirements with this patient on this date. The encounter with the patient was in whole or in part for the following MEDICAL CONDITION: (primary reason for [place]) MEDICAL NECESSITY: I certify, that based on my findings, NURSING services are a medically necessary home health service. HOME BOUND STATUS: I certify that my clinical findings support that this patient is homebound (i.e., Due to illness or injury, pt requires aid of supportive devices such as crutches, cane, wheelchairs, walkers, the use of special transportation or the assistance of another person to leave their place of residence. There is a normal inability to leave the home and doing so requires considerable and taxing effort. Other absences are for medical reasons / religious services and are infrequent or of short duration when for other reasons). If current dressing causes regression in wound condition, may D/C ordered dressing product/s and apply Normal Saline Moist Dressing daily until next [place] / Other MD appointment. [place] of regression in wound  condition at [phone]. Please direct any NON-WOUND related issues/requests for orders to patient's Primary Care Physician Wound #3 Left Hand - 3rd Digit: [place] Nurse may visit PRN to address patient s wound care needs. FACE TO FACE ENCOUNTER: MEDICARE and MEDICAID PATIENTS: I certify that this patient is under my care and that I had a face-to-face encounter that meets the physician face-to-face encounter requirements with this patient on this date. The encounter with the patient was in whole or in part for the following MEDICAL CONDITION: (primary reason for [place]) MEDICAL NECESSITY: I certify, that based on my findings, NURSING services are a medically necessary home health service. HOME BOUND STATUS: I certify that my clinical findings support that this patient is homebound (i.e., Due to illness or injury, pt requires aid of supportive devices such as crutches, cane, wheelchairs, walkers, the use of special transportation or the assistance of another person to leave their place of residence. There is a normal inability to leave the home and doing so requires considerable and taxing effort. Other absences are for medical reasons / religious services and are infrequent or of short duration when for other reasons). If current dressing causes regression in wound condition, may D/C ordered dressing product/s and apply Normal Saline Moist Dressing daily until next [place] / Other MD appointment. [place] of regression in wound condition  at [phone]. Please direct any NON-WOUND related issues/requests for orders to patient's Primary Care Physician Wound #4 Right Hand - 2nd Digit: [place] Nurse may visit PRN to address patient s wound care needs. MATALIN, PREISTER ([id number]) FACE TO FACE ENCOUNTER: MEDICARE and MEDICAID PATIENTS: I certify that this  patient is under my care and that I had a face-to-face encounter that meets the physician face-to-face encounter requirements with this patient on this date. The encounter with the patient was in whole or in part for the following MEDICAL CONDITION: (primary reason for [place]) MEDICAL NECESSITY: I certify, that based on my findings, NURSING services are a medically necessary home health service. HOME BOUND STATUS: I certify that my clinical findings support that this patient is homebound (i.e., Due to illness or injury, pt requires aid of supportive devices such as crutches, cane, wheelchairs, walkers, the use of special transportation or the assistance of another person to leave their place of residence. There is a normal inability to leave the home and doing so requires considerable and taxing effort. Other absences are for medical reasons / religious services and are infrequent or of short duration when for other reasons). If current dressing causes regression in wound condition, may D/C ordered dressing product/s and apply Normal Saline Moist Dressing daily until next [place] / Other MD appointment. [place] of regression in wound condition at [phone]. Please direct any NON-WOUND related issues/requests for orders to patient's Primary Care Physician Wound #5 Right Hand - 3rd Digit: [place] Nurse may visit PRN to address patient s wound care needs. FACE TO FACE ENCOUNTER: MEDICARE and MEDICAID PATIENTS: I certify that this patient is under my care and that I had a face-to-face encounter that meets the physician face-to-face encounter requirements with this patient on this date. The encounter with the patient was in whole or in part for the following MEDICAL CONDITION: (primary reason for [place]) MEDICAL NECESSITY: I certify, that based on my findings, NURSING services are a medically  necessary home health service. HOME BOUND STATUS: I certify that my clinical findings support that this patient is homebound (i.e., Due to illness or injury, pt requires aid of supportive devices such as crutches, cane, wheelchairs, walkers, the use of special transportation or the assistance of another person to leave their place of residence. There is a normal inability to leave the home and doing so requires considerable and taxing effort. Other absences are for medical reasons / religious services and are infrequent or of short duration when for other reasons). If current dressing causes regression in wound condition, may D/C ordered dressing product/s and apply Normal Saline Moist Dressing daily until next [place] / Other MD appointment. [place] of regression in wound condition at [phone]. Please direct any NON-WOUND related issues/requests for orders to patient's Primary Care Physician 1. I am putting silver collagen on all wounds 2. The area on her right medial calf is superficial and for now no compression. 3. The area on the mid tibia is healed 4. Very unusual areas on her fingers which I doubt are caused by bleach however these are all in the same condition raised hyperkeratotic areas which debrided down to healthy tissue although there is some depth here. I wondered whether these are cigarette burns. I did not get into this with her. Electronic Signature(s) Signed: 06/19/2019 6:09:55 PM By: Nancy Ham  MD Entered By: [name] on 06/19/2019 13:00:34 [name] ([id number]) -------------------------------------------------------------------------------- ROS/PFSH Details Patient Name: [name] Date of Service: 06/19/2019 9:45 AM Medical Record Number: [id number] Patient Account Number: [id number] Date of Birth/Sex: [birth year] (48 y.o. F) Treating RN: Harold Barban Primary Care Provider: Alma Foster Other  Clinician: Referring Provider: Alma Foster Treating Provider/Extender: [name] in Treatment: 0 Information Obtained From Patient Eyes Complaints and Symptoms: Positive for: Glasses / Contacts Review of System Notes: Neuropathy behind bilateral eyes Medical History: Negative for: Cataracts; Glaucoma; Optic Neuritis Ear/Nose/Mouth/Throat Complaints and Symptoms: Negative for: Difficult clearing ears; Sinusitis Medical History: Negative for: Chronic sinus problems/congestion; Middle ear problems Respiratory Complaints and Symptoms: Negative for: Chronic or frequent coughs; Shortness of Breath Medical History: Positive for: Asthma; Chronic Obstructive Pulmonary Disease (COPD); Sleep Apnea Gastrointestinal Complaints and Symptoms: Negative for: Frequent diarrhea; Nausea; Vomiting Medical History: Negative for: Cirrhosis ; Colitis; Crohnos; Hepatitis A; Hepatitis B; Hepatitis C Endocrine Complaints and Symptoms: Negative for: Hepatitis; Thyroid disease; Polydypsia (Excessive Thirst) Medical History: Positive for: Type II Diabetes Time with diabetes: 30 yuears Treated with: Insulin, Oral agents Blood sugar tested every day: Yes Tested : daily Genitourinary Muise, Song ([id number]) Complaints and Symptoms: Negative for: Kidney failure/ Dialysis; Incontinence/dribbling Medical History: Negative for: End Stage Renal Disease Immunological Complaints and Symptoms: Negative for: Hives Medical History: Negative for: Lupus Erythematosus; Raynaudos; Scleroderma Integumentary (Skin) Complaints and Symptoms: Positive for: Wounds - 5 Negative for: Bleeding or bruising tendency; Breakdown; Swelling Medical History: Negative for: History of Burn; History of pressure wounds Musculoskeletal Complaints and Symptoms: Positive for: Muscle Weakness - legs- neuropathy Negative for: Muscle Pain Medical History: Negative for: Gout; Rheumatoid Arthritis;  Osteoarthritis; Osteomyelitis Neurologic Complaints and Symptoms: Negative for: Numbness/parasthesias; Focal/Weakness Medical History: Negative for: Dementia; Neuropathy; Quadriplegia; Paraplegia; Seizure Disorder Past Medical History Notes: Stroke 2020 Left weakness Psychiatric Complaints and Symptoms: Positive for: Anxiety; Claustrophobia Medical History: Negative for: Anorexia/bulimia; Confinement Anxiety Hematologic/Lymphatic Medical History: Negative for: Anemia; Hemophilia; Human Immunodeficiency Virus; Lymphedema; Sickle Cell Disease Cardiovascular Medical History: Positive for: Arrhythmia - sinus tachycardia; Congestive Heart Failure; Coronary Artery Disease; Hypertension Freese, Delila ([id number]) Oncologic Immunizations Pneumococcal Vaccine: Received Pneumococcal Vaccination: Yes Implantable Devices None Family and Social History Cancer: Yes - Siblings; Diabetes: Yes - Siblings,Father,Mother; Heart Disease: Yes - Siblings,Father,Mother; Hereditary Spherocytosis: No; Hypertension: Yes - Mother,Father,Siblings; Kidney Disease: No; Lung Disease: Yes - Mother; Seizures: Yes - Mother; Stroke: Yes - Mother,Siblings,Father; Thyroid Problems: No; Tuberculosis: No; Current every day smoker - 40 years; Marital Status - Married; Alcohol Use: Never; Drug Use: No History; Caffeine Use: Daily - coffee, soda; Financial Concerns: No; Food, Clothing or Shelter Needs: No; Support System Lacking: No; Transportation Concerns: No Electronic Signature(s) Signed: 06/19/2019 6:09:55 PM By: Nancy Ham MD Signed: 06/20/2019 4:28:48 PM By: Harold Barban Entered By: Harold Barban on 06/19/2019 10:13:48 YARDEN, ROTTLER ([id number]) -------------------------------------------------------------------------------- [place] Details Patient Name: [name] Date of Service: 06/19/2019 Medical Record Number: [id number] Patient Account Number: [id number] Date of Birth/Sex: Nov 01, 1970 (48 y.o.  F) Treating RN: [name] Primary Care Provider: Alma Foster Other Clinician: Referring Provider: Alma Foster Treating Provider/Extender: [name] in Treatment: 0 Diagnosis Coding ICD-10 Codes Code Description [id number] Non-pressure chronic ulcer of right calf limited to breakdown of skin L97.821 Non-pressure chronic ulcer of other part of left lower leg limited to breakdown of skin I89.0 Lymphedema, not elsewhere classified E11.622 Type 2 diabetes mellitus with other skin ulcer S61.401D Unspecified open wound of right hand, subsequent encounter S61.402D Unspecified open wound of  left hand, subsequent encounter Facility Procedures CPT4 Code: [id number] Description: [place] VISIT-LEV 3 EST PT Modifier: Quantity: 1 CPT4 Code: [id number] Description: [id number] - DEB SUBQ TISSUE 20 SQ CM/< ICD-10 Diagnosis Description S61.401D Unspecified open wound of right hand, subsequent encounter S61.402D Unspecified open wound of left hand, subsequent encounter Modifier: Quantity: 1 Physician Procedures CPT4 Code Description: [id number] - WC PHYS LEVEL 4 - NEW PT ICD-10 Diagnosis Description L97.211 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

## 2019-06-20 NOTE — Progress Notes (Signed)
KASAUNDRA, JING ([id number]) Visit Report for 06/19/2019 Allergy List Details Patient Name: [name] Date of Service: 06/19/2019 9:45 AM Medical Record Number: [id number] Patient Account Number: [id number] Date of Birth/Sex: 1970-09-24 (48 y.o. F) Treating RN: Harold Barban Primary Care Astha Probasco: Alma Friendly Other Clinician: Referring Anahlia Iseminger: Alma Friendly Treating Angelic Schnelle/Extender: Tito Dine in Treatment: 0 Allergies Active Allergies penicillin Reaction: rash Severity: Moderate Singulair Reaction: tongue swelling Severity: Severe metoprolol morphine Reaction: face swelling, unable to swallow Severity: Severe prednisone Reaction: throat swelling Severity: Severe diltiazem Allergy Notes Electronic Signature(s) Signed: 06/20/2019 4:28:48 PM By: Harold Barban Entered By: Harold Barban on 06/19/2019 10:03:14 [name] -------------------------------------------------------------------------------- Arrival Information Details Patient Name: [name] Date of Service: 06/19/2019 9:45 AM Medical Record Number: [id number] Patient Account Number: [id number] Date of Birth/Sex: [birth year] (48 y.o. F) Treating RN: Harold Barban Primary Care Keanthony Poole: Alma Friendly Other Clinician: Referring Dawana Asper: Alma Friendly Treating Shaindy Reader/Extender: Tito Dine in Treatment: 0 Visit Information Patient Arrived: Wheel Chair Arrival Time: 09:56 Accompanied By: husband Transfer Assistance: Manual Patient Identification Verified: Yes Secondary Verification Process Yes Completed: Patient Has Alerts: Yes Patient Alerts: ABI L/R 1.02 TBI L/R 0.6 Electronic Signature(s) Signed: 06/19/2019 10:47:55 AM By: Harold Barban Entered By: Harold Barban on 06/19/2019 10:47:55 Reising, [name] ([id number]) -------------------------------------------------------------------------------- Clinic Level of Care Assessment  Details Patient Name: [name] Date of Service: 06/19/2019 9:45 AM Medical Record Number: [id number] Patient Account Number: [id number] Date of Birth/Sex: 1970/12/23 (48 y.o. F) Treating RN: Cornell Barman Primary Care Javeria Briski: Alma Friendly Other Clinician: Referring Carron Mcmurry: Alma Friendly Treating Algenis Ballin/Extender: Tito Dine in Treatment: 0 Clinic Level of Care Assessment Items TOOL 1 Quantity Score []  - Use when EandM and Procedure is performed on INITIAL visit 0 ASSESSMENTS - Nursing Assessment / Reassessment X - General Physical Exam (combine w/ comprehensive assessment (listed just below) when 1 20 performed on new pt. evals) X- 1 25 Comprehensive Assessment (HX, ROS, Risk Assessments, Wounds Hx, etc.) ASSESSMENTS - Wound and Skin Assessment / Reassessment []  - Dermatologic / Skin Assessment (not related to wound area) 0 ASSESSMENTS - Ostomy and/or Continence Assessment and Care []  - Incontinence Assessment and Management 0 []  - 0 Ostomy Care Assessment and Management (repouching, etc.) PROCESS - Coordination of Care X - Simple Patient / Family Education for ongoing care 1 15 []  - 0 Complex (extensive) Patient / Family Education for ongoing care X- 1 10 Staff obtains Programmer, systems, Records, Test Results / Process Orders []  - 0 Staff telephones HHA, Nursing Homes / Clarify orders / etc []  - 0 Routine Transfer to another Facility (non-emergent condition) []  - 0 Routine Hospital Admission (non-emergent condition) X- 1 15 New Admissions / Biomedical engineer / Ordering NPWT, Apligraf, etc. []  - 0 Emergency Hospital Admission (emergent condition) PROCESS - Special Needs []  - Pediatric / Minor Patient Management 0 []  - 0 Isolation Patient Management []  - 0 Hearing / Language / Visual special needs []  - 0 Assessment of Community assistance (transportation, D/C planning, etc.) []  - 0 Additional assistance / Altered mentation []  - 0 Support  Surface(s) Assessment (bed, cushion, seat, etc.) Koeneman, Merikay ([id number]) INTERVENTIONS - Miscellaneous []  - External ear exam 0 []  - 0 Patient Transfer (multiple staff / Civil Service fast streamer / Similar devices) []  - 0 Simple Staple / Suture removal (25 or less) []  - 0 Complex Staple / Suture removal (26 or more) []  - 0 Hypo/Hyperglycemic Management (do not check if billed separately) []  - 0 Ankle / Brachial Index (ABI) -  do not check if billed separately Has the patient been seen at the hospital within the last three years: Yes Total Score: 85 Level Of Care: New/Established - Level 3 Electronic Signature(s) Signed: 06/19/2019 5:38:26 PM By: Gretta Cool, BSN, RN, CWS, Kim RN, BSN Entered By: Gretta Cool, BSN, RN, CWS, Kim on 06/19/2019 11:03:29 [name] -------------------------------------------------------------------------------- Encounter Discharge Information Details Patient Name: [name] Date of Service: 06/19/2019 9:45 AM Medical Record Number: [id number] Patient Account Number: [id number] Date of Birth/Sex: 1971/01/24 (48 y.o. F) Treating RN: Montey Hora Primary Care Zayleigh Stroh: Alma Friendly Other Clinician: Referring Blessed Cotham: Alma Friendly Treating Twala Collings/Extender: Tito Dine in Treatment: 0 Encounter Discharge Information Items Post Procedure Vitals Discharge Condition: Stable Temperature (F): 98.1 Ambulatory Status: Wheelchair Pulse (bpm): 82 Discharge Destination: Home Respiratory Rate (breaths/min): 18 Transportation: Private Auto Blood Pressure (mmHg): 130/63 Accompanied By: spouse Schedule Follow-up Appointment: Yes Clinical Summary of Care: Electronic Signature(s) Signed: 06/19/2019 5:12:19 PM By: Montey Hora Entered By: Montey Hora on 06/19/2019 11:22:41 Mccullar, [name] ([id number]) -------------------------------------------------------------------------------- Lower Extremity Assessment Details Patient Name: [name] Date of Service: 06/19/2019 9:45 AM Medical Record Number: [id number] Patient Account Number: [id number] Date of Birth/Sex: [birth year] (48 y.o. F) Treating RN: Harold Barban Primary Care Abass Misener: Alma Friendly Other Clinician: Referring Ndea Kilroy: Alma Friendly Treating Kamia Insalaco/Extender: Tito Dine in Treatment: 0 Edema Assessment Assessed: [Left: No] [Right: No] [Left: Edema] [Right: :] Calf Left: Right: Point of Measurement: 32 cm From Medial Instep 46.4 cm 46.5 cm Ankle Left: Right: Point of Measurement: 10 cm From Medial Instep 25 cm 26 cm Vascular Assessment Pulses: Dorsalis Pedis Palpable: [Left:Yes] [Right:Yes] Posterior Tibial Palpable: [Left:Yes] [Right:Yes] Electronic Signature(s) Signed: 06/20/2019 4:28:48 PM By: Harold Barban Entered By: Harold Barban on 06/19/2019 10:23:47 Stehlik, [name] ([id number]) -------------------------------------------------------------------------------- Multi Wound Chart Details Patient Name: [name] Date of Service: 06/19/2019 9:45 AM Medical Record Number: [id number] Patient Account Number: [id number] Date of Birth/Sex: Jan 24, 1971 (48 y.o. F) Treating RN: Cornell Barman Primary Care Neri Samek: Alma Friendly Other Clinician: Referring Yoshito Gaza: Alma Friendly Treating Bettylou Frew/Extender: Tito Dine in Treatment: 0 Vital Signs Height(in): 64 Pulse(bpm): 21 Weight(lbs): 286 Blood Pressure(mmHg): 130/63 Body Mass Index(BMI): 49 Temperature(F): 98.1 Respiratory Rate 16 (breaths/min): Photos: Wound Location: Right Lower Leg - Medial Left Hand - 3rd Digit Right Hand - 2nd Digit Wounding Event: Trauma Trauma Trauma Primary Etiology: Diabetic Wound/Ulcer of the Trauma, Other Trauma, Other Lower Extremity Comorbid History: Asthma, Chronic Obstructive Asthma, Chronic Obstructive Asthma, Chronic Obstructive Pulmonary Disease (COPD), Pulmonary Disease (COPD), Pulmonary Disease  (COPD), Sleep Apnea, Arrhythmia, Sleep Apnea, Arrhythmia, Sleep Apnea, Arrhythmia, Congestive Heart Failure, Congestive Heart Failure, Congestive Heart Failure, Coronary Artery Disease, Coronary Artery Disease, Coronary Artery Disease, Hypertension, Type II Diabetes Hypertension, Type II Diabetes Hypertension, Type II Diabetes Date Acquired: 05/20/2019 05/20/2019 05/20/2019 Weeks of Treatment: 0 0 0 Wound Status: Open Open Open Measurements L x W x D 0.8x0.5x0.1 0.4x0.4x0.4 0.6x0.5x0.6 (cm) Area (cm) : 0.314 0.126 0.236 Volume (cm) : 0.031 0.05 0.141 % Reduction in Area: N/A 0.00% 0.00% % Reduction in Volume: N/A -284.60% -487.50% Classification: Unable to visualize wound bed Partial Thickness Full Thickness Without Exposed Support Structures Exudate Amount: None Present None Present None Present Wound Margin: Thickened Thickened Thickened Granulation Amount: None Present (0%) None Present (0%) None Present (0%) Necrotic Amount: Large (67-100%) Large (67-100%) Large (67-100%) Necrotic Tissue: Junction Exposed Structures: Fascia: No Fascia: No Fat Layer (Subcutaneous Fat Layer (Subcutaneous Fat Layer (Subcutaneous Tissue) Exposed: Yes Tissue) Exposed: No Tissue) Exposed: No  Fascia: No Umland, Donita ([id number]) Tendon: No Tendon: No Tendon: No Muscle: No Muscle: No Muscle: No Joint: No Joint: No Joint: No Bone: No Bone: No Bone: No Epithelialization: None None None Debridement: Debridement - Excisional N/A Debridement - Excisional Pre-procedure 10:50 N/A 10:50 Verification/Time Out Taken: Pain Control: Lidocaine N/A Lidocaine Tissue Debrided: Necrotic/Eschar, N/A Necrotic/Eschar, Subcutaneous Subcutaneous Level: Skin/Subcutaneous Tissue N/A Skin/Subcutaneous Tissue Debridement Area (sq cm): 0.4 N/A 0.3 Instrument: Curette N/A Curette Bleeding: Minimum N/A Minimum Hemostasis Achieved: Pressure N/A Pressure Debridement Treatment Procedure was  tolerated well N/A Procedure was tolerated well Response: Post Debridement 0.8x0.5x0.2 N/A 0.6x0.5x0.6 Measurements L x W x D (cm) Post Debridement Volume: 0.063 N/A 0.141 (cm) Procedures Performed: Debridement N/A Debridement Wound Number: 5 N/A N/A Photos: N/A N/A Wound Location: Right Hand - 3rd Digit N/A N/A Wounding Event: Trauma N/A N/A Primary Etiology: Trauma, Other N/A N/A Comorbid History: Asthma, Chronic Obstructive N/A N/A Pulmonary Disease (COPD), Sleep Apnea, Arrhythmia, Congestive Heart Failure, Coronary Artery Disease, Hypertension, Type II Diabetes Date Acquired: 05/20/2019 N/A N/A Weeks of Treatment: 0 N/A N/A Wound Status: Open N/A N/A Measurements L x W x D 0.8x0.9x0.2 N/A N/A (cm) Area (cm) : 0.565 N/A N/A Volume (cm) : 0.113 N/A N/A % Reduction in Area: 0.00% N/A N/A % Reduction in Volume: -98.20% N/A N/A Classification: Full Thickness Without N/A N/A Exposed Support Structures Exudate Amount: None Present N/A N/A Wound Margin: Thickened N/A N/A Granulation Amount: None Present (0%) N/A N/A Pittmon, Draven ([id number]) Necrotic Amount: Large (67-100%) N/A N/A Necrotic Tissue: Adherent Slough N/A N/A Exposed Structures: Fat Layer (Subcutaneous N/A N/A Tissue) Exposed: Yes Fascia: No Tendon: No Muscle: No Joint: No Bone: No Epithelialization: None N/A N/A Debridement: Debridement - Excisional N/A N/A Pre-procedure 10:50 N/A N/A Verification/Time Out Taken: Pain Control: Lidocaine N/A N/A Tissue Debrided: Necrotic/Eschar, N/A N/A Subcutaneous Level: Skin/Subcutaneous Tissue N/A N/A Debridement Area (sq cm): 0.72 N/A N/A Instrument: Curette N/A N/A Bleeding: Minimum N/A N/A Hemostasis Achieved: Pressure N/A N/A Debridement Treatment Procedure was tolerated well N/A N/A Response: Post Debridement 0.8x0.9x0.4 N/A N/A Measurements L x W x D (cm) Post Debridement Volume: 0.226 N/A N/A (cm) Procedures Performed: Debridement N/A  N/A Treatment Notes Wound #2 (Right, Medial Lower Leg) Notes prisma and BFD or coverlets used Wound #3 (Left Hand - 3rd Digit) Notes prisma and BFD or coverlets used Wound #4 (Right Hand - 2nd Digit) Notes prisma and BFD or coverlets used Wound #5 (Right Hand - 3rd Digit) Notes prisma and BFD or coverlets used Electronic Signature(s) Signed: 06/19/2019 6:09:55 PM By: Linton Ham MD Entered By: Linton Ham on 06/19/2019 12:49:07 Scherman, [place] ([id number]) TOMICO, GANNAWAY ([id number]) -------------------------------------------------------------------------------- Multi-Disciplinary Care Plan Details Patient Name: [name] Date of Service: 06/19/2019 9:45 AM Medical Record Number: [id number] Patient Account Number: [id number] Date of Birth/Sex: August 06, 1970 (48 y.o. F) Treating RN: Cornell Barman Primary Care Dollie Mayse: Alma Friendly Other Clinician: Referring Laneta Guerin: Alma Friendly Treating Dariush Mcnellis/Extender: Tito Dine in Treatment: 0 Active Inactive Abuse / Safety / Falls / Self Care Management Nursing Diagnoses: History of Falls Goals: Patient will not develop complications from immobility Date Initiated: 06/19/2019 Target Resolution Date: 07/10/2019 Goal Status: Active Patient will remain injury free related to falls Date Initiated: 06/19/2019 Target Resolution Date: 07/10/2019 Goal Status: Active Interventions: Assess fall risk on admission and as needed Treatment Activities: Patient referred to home care : 06/19/2019 Notes: Orientation to the Wound Care Program Nursing Diagnoses: Knowledge deficit related to the wound healing center program Goals: Patient/caregiver will  verbalize understanding of the [facility] Program Date Initiated: 06/19/2019 Target Resolution Date: 07/10/2019 Goal Status: Active Interventions: Provide education on orientation to the wound center Notes: Peripheral Neuropathy Nursing Diagnoses: Knowledge  deficit related to disease process and management of peripheral neurovascular dysfunction Potential alteration in peripheral tissue perfusion (select prior to confirmation of diagnosis) Strnad, [name] ([id number]) Goals: Patient/caregiver will verbalize understanding of disease process and disease management Date Initiated: 06/19/2019 Target Resolution Date: 07/10/2019 Goal Status: Active Interventions: Assess signs and symptoms of neuropathy upon admission and as needed Treatment Activities: Patient referred for customized footwear/offloading : 06/19/2019 Notes: Wound/Skin Impairment Nursing Diagnoses: Impaired tissue integrity Goals: Patient/caregiver will verbalize understanding of skin care regimen Date Initiated: 06/19/2019 Target Resolution Date: 07/10/2019 Goal Status: Active Ulcer/skin breakdown will have a volume reduction of 30% by week 4 Date Initiated: 06/19/2019 Target Resolution Date: 07/20/2019 Goal Status: Active Interventions: Assess patient/caregiver ability to obtain necessary supplies Assess ulceration(s) every visit Treatment Activities: Skin care regimen initiated : 06/19/2019 Topical wound management initiated : 06/19/2019 Notes: Electronic Signature(s) Signed: 06/19/2019 5:38:26 PM By: Gretta Cool, BSN, RN, CWS, Kim RN, BSN Entered By: Gretta Cool, BSN, RN, CWS, Kim on 06/19/2019 10:50:15 [name] -------------------------------------------------------------------------------- Pain Assessment Details Patient Name: [name] Date of Service: 06/19/2019 9:45 AM Medical Record Number: [id number] Patient Account Number: [id number] Date of Birth/Sex: 10/05/1970 (48 y.o. F) Treating RN: Harold Barban Primary Care Mcgwire Dasaro: Alma Friendly Other Clinician: Referring Kalil Woessner: Alma Friendly Treating Anayansi Rundquist/Extender: Tito Dine in Treatment: 0 Active Problems Location of Pain Severity and Description of Pain Patient Has Paino  Yes Site Locations Rate the pain. Current Pain Level: 8 Pain Management and Medication Current Pain Management: Electronic Signature(s) Signed: 06/20/2019 4:28:48 PM By: Harold Barban Entered By: Harold Barban on 06/19/2019 09:57:42 Appleby, [name] ([id number]) -------------------------------------------------------------------------------- Patient/Caregiver Education Details Patient Name: [name] Date of Service: 06/19/2019 9:45 AM Medical Record Number: [id number] Patient Account Number: [id number] Date of Birth/Gender: Oct 03, 1970 (48 y.o. F) Treating RN: Cornell Barman Primary Care Physician: Alma Friendly Other Clinician: Referring Physician: Alma Friendly Treating Physician/Extender: Tito Dine in Treatment: 0 Education Assessment Education Provided To: Patient Education Topics Provided Welcome To [facility]: Handouts: Welcome To [facility] Methods: Demonstration, Explain/Verbal Responses: State content correctly Wound/Skin Impairment: Handouts: Caring for Your Ulcer Methods: Demonstration, Explain/Verbal Responses: State content correctly Electronic Signature(s) Signed: 06/19/2019 5:38:26 PM By: Gretta Cool, BSN, RN, CWS, Kim RN, BSN Entered By: Gretta Cool, BSN, RN, CWS, Kim on 06/19/2019 11:04:31 [name] -------------------------------------------------------------------------------- Wound Assessment Details Patient Name: [name] Date of Service: 06/19/2019 9:45 AM Medical Record Number: [id number] Patient Account Number: [id number] Date of Birth/Sex: [birth year] (48 y.o. F) Treating RN: Harold Barban Primary Care Ashleah Valtierra: Alma Friendly Other Clinician: Referring Zailee Vallely: Alma Friendly Treating Doneisha Ivey/Extender: Tito Dine in Treatment: 0 Wound Status Wound Number: 2 Primary Diabetic Wound/Ulcer of the Lower Extremity Etiology: Wound Location: Right Lower Leg - Medial Wound  Open Wounding Event: Trauma Status: Date Acquired: 05/20/2019 Comorbid Asthma, Chronic Obstructive Pulmonary Disease Weeks Of Treatment: 0 History: (COPD), Sleep Apnea, Arrhythmia, Congestive Clustered Wound: No Heart Failure, Coronary Artery Disease, Hypertension, Type II Diabetes Photos Wound Measurements Length: (cm) 0.8 Width: (cm) 0.5 Depth: (cm) 0.1 Area: (cm) 0.314 Volume: (cm) 0.031 % Reduction in Area: % Reduction in Volume: Epithelialization: None Tunneling: No Undermining: No Wound Description Classification: Unable to visualize wound bed Wound Margin: Thickened Exudate Amount: None Present Foul Odor After Cleansing: No Slough/Fibrino Yes Wound Bed Granulation Amount:  None Present (0%) Exposed Structure Necrotic Amount: Large (67-100%) Fascia Exposed: No Necrotic Quality: Eschar Fat Layer (Subcutaneous Tissue) Exposed: No Tendon Exposed: No Muscle Exposed: No Joint Exposed: No Bone Exposed: No Treatment Notes Wound #2 (Right, Medial Lower Leg) Kinyon, Quincey ([id number]) Notes prisma and BFD or coverlets used Electronic Signature(s) Signed: 06/20/2019 4:28:48 PM By: Harold Barban Entered By: Harold Barban on 06/19/2019 10:27:40 Peek, [name] ([id number]) -------------------------------------------------------------------------------- Wound Assessment Details Patient Name: [name] Date of Service: 06/19/2019 9:45 AM Medical Record Number: [id number] Patient Account Number: [id number] Date of Birth/Sex: [birth year] (48 y.o. F) Treating RN: Cornell Barman Primary Care Kinslee Dalpe: Alma Friendly Other Clinician: Referring Uchenna Rappaport: Alma Friendly Treating Yobany Vroom/Extender: Tito Dine in Treatment: 0 Wound Status Wound Number: 3 Primary Trauma, Other Etiology: Wound Location: Left Hand - 3rd Digit Wound Open Wounding Event: Trauma Status: Date Acquired: 05/20/2019 Comorbid Asthma, Chronic Obstructive Pulmonary Disease Weeks Of  Treatment: 0 History: (COPD), Sleep Apnea, Arrhythmia, Congestive Clustered Wound: No Heart Failure, Coronary Artery Disease, Hypertension, Type II Diabetes Photos Wound Measurements Length: (cm) 0.4 % Reductio Width: (cm) 0.4 % Reductio Depth: (cm) 0.4 Epithelial Area: (cm) 0.126 Tunneling Volume: (cm) 0.05 Undermini n in Area: 0% n in Volume: -284.6% ization: None : No ng: No Wound Description Classification: Partial Thickness Wound Margin: Thickened Exudate Amount: None Present Foul Odor After Cleansing: No Slough/Fibrino Yes Wound Bed Granulation Amount: None Present (0%) Exposed Structure Necrotic Amount: Large (67-100%) Fascia Exposed: No Necrotic Quality: Eschar Fat Layer (Subcutaneous Tissue) Exposed: No Tendon Exposed: No Muscle Exposed: No Joint Exposed: No Bone Exposed: No Treatment Notes Wound #3 (Left Hand - 3rd Digit) Gathright, Kaisha ([id number]) Notes prisma and BFD or coverlets used Electronic Signature(s) Signed: 06/19/2019 5:38:26 PM By: Gretta Cool, BSN, RN, CWS, Kim RN, BSN Entered By: Gretta Cool, BSN, RN, CWS, Kim on 06/19/2019 10:56:57 Baade, [name] ([id number]) -------------------------------------------------------------------------------- Wound Assessment Details Patient Name: [name] Date of Service: 06/19/2019 9:45 AM Medical Record Number: [id number] Patient Account Number: [id number] Date of Birth/Sex: 1971-06-27 (48 y.o. F) Treating RN: Cornell Barman Primary Care Davaun Quintela: Alma Friendly Other Clinician: Referring Gaylon Melchor: Alma Friendly Treating Umar Patmon/Extender: Tito Dine in Treatment: 0 Wound Status Wound Number: 4 Primary Trauma, Other Etiology: Wound Location: Right Hand - 2nd Digit Wound Open Wounding Event: Trauma Status: Date Acquired: 05/20/2019 Comorbid Asthma, Chronic Obstructive Pulmonary Disease Weeks Of Treatment: 0 History: (COPD), Sleep Apnea, Arrhythmia, Congestive Clustered Wound: No Heart  Failure, Coronary Artery Disease, Hypertension, Type II Diabetes Photos Wound Measurements Length: (cm) 0.6 Width: (cm) 0.5 Depth: (cm) 0.6 Area: (cm) 0.236 Volume: (cm) 0.141 % Reduction in Area: 0% % Reduction in Volume: -487.5% Epithelialization: None Tunneling: No Undermining: No Wound Description Full Thickness Without Exposed Support Classification: Structures Wound Margin: Thickened Exudate None Present Amount: Foul Odor After Cleansing: No Slough/Fibrino Yes Wound Bed Granulation Amount: None Present (0%) Exposed Structure Necrotic Amount: Large (67-100%) Fascia Exposed: No Necrotic Quality: Adherent Slough Fat Layer (Subcutaneous Tissue) Exposed: Yes Tendon Exposed: No Muscle Exposed: No Joint Exposed: No Bone Exposed: No Janowski, Davonda ([id number]) Treatment Notes Wound #4 (Right Hand - 2nd Digit) Notes prisma and BFD or coverlets used Electronic Signature(s) Signed: 06/19/2019 5:38:26 PM By: Gretta Cool, BSN, RN, CWS, Kim RN, BSN Entered By: Gretta Cool, BSN, RN, CWS, Kim on 06/19/2019 10:56:58 [name] -------------------------------------------------------------------------------- Wound Assessment Details Patient Name: [name] Date of Service: 06/19/2019 9:45 AM Medical Record Number: [id number] Patient Account Number: [id number] Date of Birth/Sex: 02/08/1971 (48 y.o. F) Treating RN: Cornell Barman Primary  Care Iden Stripling: Alma Friendly Other Clinician: Referring Emaya Preston: Alma Friendly Treating Fayez Sturgell/Extender: Tito Dine in Treatment: 0 Wound Status Wound Number: 5 Primary Trauma, Other Etiology: Wound Location: Right Hand - 3rd Digit Wound Open Wounding Event: Trauma Status: Date Acquired: 05/20/2019 Comorbid Asthma, Chronic Obstructive Pulmonary Disease Weeks Of Treatment: 0 History: (COPD), Sleep Apnea, Arrhythmia, Congestive Clustered Wound: No Heart Failure, Coronary Artery Disease, Hypertension, Type II  Diabetes Photos Wound Measurements Length: (cm) 0.8 Width: (cm) 0.9 Depth: (cm) 0.2 Area: (cm) 0.565 Volume: (cm) 0.113 % Reduction in Area: 0% % Reduction in Volume: -98.2% Epithelialization: None Tunneling: No Undermining: No Wound Description Full Thickness Without Exposed Support Classification: Structures Wound Margin: Thickened Exudate None Present Amount: Foul Odor After Cleansing: No Slough/Fibrino Yes Wound Bed Granulation Amount: None Present (0%) Exposed Structure Necrotic Amount: Large (67-100%) Fascia Exposed: No Necrotic Quality: Adherent Slough Fat Layer (Subcutaneous Tissue) Exposed: Yes Tendon Exposed: No Muscle Exposed: No Joint Exposed: No Bone Exposed: No Dewan, Alexxa ([id number]) Treatment Notes Wound #5 (Right Hand - 3rd Digit) Notes prisma and BFD or coverlets used Electronic Signature(s) Signed: 06/19/2019 5:38:26 PM By: Gretta Cool, BSN, RN, CWS, Kim RN, BSN Entered By: Gretta Cool, BSN, RN, CWS, Kim on 06/19/2019 10:56:59 [place], [name] ([id number]) -------------------------------------------------------------------------------- [place] Details Patient Name: [name] Date of Service: 06/19/2019 9:45 AM Medical Record Number: [id number] Patient Account Number: [id number] Date of Birth/Sex: [birth year] (48 y.o. F) Treating RN: Harold Barban Primary Care Ramina Hulet: Alma Friendly Other Clinician: Referring Miyana Mordecai: Alma Friendly Treating Yandriel Boening/Extender: Tito Dine in Treatment: 0 Vital Signs Time Taken: 09:55 Temperature (F): 98.1 Height (in): 64 Pulse (bpm): 82 Source: Stated Respiratory Rate (breaths/min): 16 Weight (lbs): 286 Blood Pressure (mmHg): 130/63 Source: Stated Reference Range: 80 - 120 mg / dl Body Mass Index (BMI): 49.1 Electronic Signature(s) Signed: 06/20/2019 4:28:48 PM By: Harold Barban Entered By: Harold Barban on 06/19/2019 09:59:20

## 2019-06-25 ENCOUNTER — Telehealth: Payer: Self-pay | Admitting: *Deleted

## 2019-06-25 NOTE — Telephone Encounter (Signed)
Please notify [name] to update her cardiologist and endocrinologist.   It looks like patient is overdue for cardiology visit unless she's gone within the last month or two. Same goes for endocrinologist.   Have her add in an extra torsemide now, continue with evening dose. Take 40 mg (2 tablets) of torsemide for next 3 days, continue with 20 mg at night.  Please have her update cardiologist.  If she has difficulty breathing, worse than usual, with temperatures, and inability to get weight down then she needs to go to the hospital.

## 2019-06-25 NOTE — Telephone Encounter (Signed)
Olivia Mackie nurse with [facility] left a voicemail with an update on patient. Olivia Mackie stated that she is reporting that patient has weight gain and has increase in SOB with any/minimum exertion. Olivia Mackie stated that patient's weight is up 12 pounds in 5 days and she did take her torsemide this morning. Olivia Mackie stated that her blood sugar this morning was 511. Olivia Mackie stated that she has a cough with clear sputum. Olivia Mackie stated that patient's blood pressure today was 110/60, heart rate 84 and 94% oxygen level on room air.

## 2019-06-26 ENCOUNTER — Ambulatory Visit: Payer: BC Managed Care – PPO | Admitting: Internal Medicine

## 2019-06-26 DIAGNOSIS — E11622 Type 2 diabetes mellitus with other skin ulcer: Secondary | ICD-10-CM | POA: Diagnosis not present

## 2019-06-26 DIAGNOSIS — L97211 Non-pressure chronic ulcer of right calf limited to breakdown of skin: Secondary | ICD-10-CM | POA: Diagnosis not present

## 2019-06-26 DIAGNOSIS — I890 Lymphedema, not elsewhere classified: Secondary | ICD-10-CM | POA: Diagnosis not present

## 2019-06-26 DIAGNOSIS — L97821 Non-pressure chronic ulcer of other part of left lower leg limited to breakdown of skin: Secondary | ICD-10-CM | POA: Diagnosis not present

## 2019-06-26 NOTE — Telephone Encounter (Signed)
Left message for [name] on 06/25/2019 and 06/26/2019

## 2019-06-28 ENCOUNTER — Other Ambulatory Visit: Payer: Self-pay | Admitting: Primary Care

## 2019-06-28 DIAGNOSIS — G2581 Restless legs syndrome: Secondary | ICD-10-CM

## 2019-06-28 NOTE — Telephone Encounter (Signed)
Last prescribed on 01/01/2019 . Last appointment on 06/04/2019 . No future appointment

## 2019-06-28 NOTE — Telephone Encounter (Signed)
Noted, refill sent to pharmacy. 

## 2019-07-02 ENCOUNTER — Telehealth: Payer: Self-pay

## 2019-07-02 DIAGNOSIS — R252 Cramp and spasm: Secondary | ICD-10-CM

## 2019-07-02 NOTE — Telephone Encounter (Signed)
Message left for patient to return my call.  

## 2019-07-02 NOTE — Telephone Encounter (Signed)
Patient is returning your call.  

## 2019-07-02 NOTE — Telephone Encounter (Addendum)
Spoken and notified patient of [name] comments. Patient stated that sometimes, it is watery/liquid. Patient stated that she will call cardiology after she hang up with me.

## 2019-07-02 NOTE — Telephone Encounter (Signed)
I don't think torsemide is causing the diarrhea. She has been on it for long time. She can hold torsemide for next 3 days, drink plenty of fluids. Recommend follow up with PCP if diarrhea gets worse. Looks like she missed appt with me in August. Please arrange appt with me in 1-2 weeks.   Thanks MJP

## 2019-07-02 NOTE — Telephone Encounter (Signed)
Noted. As long as stools are not watery/liquid then that's fine. Have her reduce back down to her torsemide 20 mg BID, follow up with cardiology.

## 2019-07-02 NOTE — Telephone Encounter (Signed)
Please call patient and have patient follow up with cardiologist for ongoing cardiac care.  Also needs evaluation for diarrhea.  Is the weight loss from the increase in her torsemide 40 mg in the morning and 20 mg at night? Is she staying hydrated? Any other symptoms: Fever, chills, headache, other Covid symptoms?  She needs to be weighing herself daily every morning either without clothing or with the same type of clothing each morning.

## 2019-07-02 NOTE — Telephone Encounter (Addendum)
Spoken to [place]. She stated that patient is having a lot of loose stool and lost 24lbs in just the past 3 days. Olivia Mackie stated that cannot update cardiologist since patient is overdue for appointment, they suggested patient to make an appointment.

## 2019-07-02 NOTE — Telephone Encounter (Signed)
[name] is returning your call from last week. She is requesting a call back . Would also like to give update on the patient    [name] C/B # [id number]

## 2019-07-02 NOTE — Telephone Encounter (Signed)
Telephone encounter:  Reason for call: Pt called and said that she has had diarrhea for 1 week, lost 24 pounds in 7 dayst, pcp asked her to call you to get an appointment . Her heart rate is low as well  And she is dehydrated Please call her   Usual provider: [facility]  Last office visit: 11/16/18  Next office visit: NA   Last hospitalization: 03/21/19   Current Outpatient Medications on File Prior to Visit  Medication Sig Dispense Refill  . acetaminophen (TYLENOL) 500 MG tablet Take 500 mg by mouth every 4 (four) hours as needed for moderate pain.     Marland Kitchen ALPRAZolam (XANAX) 0.5 MG tablet Take 1 tablet (0.5 mg total) by mouth 2 (two) times daily as needed for anxiety. (Patient taking differently: Take 1 mg by mouth daily. )  0  . aspirin EC 81 MG tablet Take 81 mg by mouth every morning.    . budesonide (PULMICORT) 0.5 MG/2ML nebulizer solution Take 0.5 mg by nebulization 2 (two) times daily.    . budesonide-formoterol (SYMBICORT) 80-4.5 MCG/ACT inhaler INHALE 2 PUFFS BY MOUTH EVERY 12 HOURS TO PREVENT COUGH OR WHEEZING *RINSE MOUTH AFTER EACH USE* 10.2 g 6  . busPIRone (BUSPAR) 30 MG tablet Take 30 mg by mouth 2 (two) times daily.    . famotidine (PEPCID) 20 MG tablet TAKE 1 TABLET (20 MG TOTAL) BY MOUTH 2 (TWO) TIMES DAILY. FOR HEARTBURN. 180 tablet 1  . FLUoxetine (PROZAC) 40 MG capsule Take 40 mg by mouth at bedtime.     Marland Kitchen glucose blood (ONETOUCH VERIO) test strip Use to monitor glucose levels three times daily; E11.9; E10.42 100 each 12  . Insulin Pen Needle 32G X 4 MM MISC Used to give insulin injections twice daily. 100 each 11  . insulin regular human CONCENTRATED (HUMULIN R U-500 KWIKPEN) 500 UNIT/ML kwikpen Inject 250 Units into the skin 2 (two) times a day. (Patient taking differently: Inject 180-230 Units into the skin 2 (two) times a day. Sliding scale) 14 pen 11  . ipratropium-albuterol (DUONEB) 0.5-2.5 (3) MG/3ML SOLN Take 3 mLs by nebulization every 4 (four) hours as needed  (wheezing/shortness of breath). 360 mL 0  . loperamide (IMODIUM A-D) 2 MG tablet Take 4 mg by mouth 3 (three) times daily as needed for diarrhea or loose stools.    . metolazone (ZAROXOLYN) 2.5 MG tablet Take 2.5 mg by mouth at bedtime.    Glory Rosebush Delica Lancets [id number] MISC 1 each by Does not apply route 3 (three) times daily. Use to monitor glucose levels three times daily; E11.9; E10.42 100 each 11  . OXYGEN Inhale 4 L into the lungs at bedtime.     . pregabalin (LYRICA) 300 MG capsule Take 1 capsule (300 mg total) by mouth 2 (two) times daily. as needed for neuropathy. 180 capsule 0  . PROVENTIL HFA 108 (90 Base) MCG/ACT inhaler INHALE 2 PUFFS BY MOUTH INTO THE LUNGS EVERY 4 HOURS AS NEEDED FOR SHORTNESS OFBREATH 6.7 g 6  . rOPINIRole (REQUIP) 1 MG tablet TAKE 1 TABLET BY MOUTH EVERY NIGHT AT BEDTIME FOR RESTLESS LEGS. 90 tablet 1  . spironolactone (ALDACTONE) 50 MG tablet TAKE 1 TABLET BY MOUTH ONCE A DAY 90 tablet 0  . sulfamethoxazole-trimethoprim (BACTRIM DS) 800-160 MG tablet Take 1 tablet by mouth 2 (two) times daily. For skin infection. 20 tablet 0  . tobramycin (TOBREX) 0.3 % ophthalmic solution Place 1 drop into both eyes daily.     Marland Kitchen  torsemide (DEMADEX) 20 MG tablet Take 20 mg by mouth 2 (two) times daily.     . traZODone (DESYREL) 100 MG tablet Take 100 mg by mouth daily.     Marland Kitchen ULTICARE MINI PEN NEEDLES 31G X 6 MM MISC USE AS DIRECTED THREE TIMES DAILY 100 each 0   No current facility-administered medications on file prior to visit.

## 2019-07-02 NOTE — Telephone Encounter (Signed)
Spoken and notified patient of [name] comments. Patient stated:  Yes Trying to stay hydrated  No, other symptoms  Patient verbalized understanding

## 2019-07-02 NOTE — Telephone Encounter (Signed)
Noted  

## 2019-07-03 ENCOUNTER — Other Ambulatory Visit: Payer: Self-pay

## 2019-07-03 ENCOUNTER — Encounter: Payer: BC Managed Care – PPO | Admitting: Internal Medicine

## 2019-07-03 DIAGNOSIS — E11622 Type 2 diabetes mellitus with other skin ulcer: Secondary | ICD-10-CM | POA: Diagnosis not present

## 2019-07-03 DIAGNOSIS — I2510 Atherosclerotic heart disease of native coronary artery without angina pectoris: Secondary | ICD-10-CM | POA: Diagnosis not present

## 2019-07-03 DIAGNOSIS — Z86718 Personal history of other venous thrombosis and embolism: Secondary | ICD-10-CM | POA: Diagnosis not present

## 2019-07-03 DIAGNOSIS — I890 Lymphedema, not elsewhere classified: Secondary | ICD-10-CM | POA: Diagnosis not present

## 2019-07-03 DIAGNOSIS — F17200 Nicotine dependence, unspecified, uncomplicated: Secondary | ICD-10-CM | POA: Diagnosis not present

## 2019-07-03 DIAGNOSIS — S61202A Unspecified open wound of right middle finger without damage to nail, initial encounter: Secondary | ICD-10-CM | POA: Diagnosis not present

## 2019-07-03 DIAGNOSIS — E1142 Type 2 diabetes mellitus with diabetic polyneuropathy: Secondary | ICD-10-CM | POA: Diagnosis not present

## 2019-07-03 DIAGNOSIS — S61200A Unspecified open wound of right index finger without damage to nail, initial encounter: Secondary | ICD-10-CM | POA: Diagnosis not present

## 2019-07-03 DIAGNOSIS — G4733 Obstructive sleep apnea (adult) (pediatric): Secondary | ICD-10-CM | POA: Diagnosis not present

## 2019-07-03 DIAGNOSIS — S81811A Laceration without foreign body, right lower leg, initial encounter: Secondary | ICD-10-CM | POA: Diagnosis not present

## 2019-07-03 DIAGNOSIS — S61203A Unspecified open wound of left middle finger without damage to nail, initial encounter: Secondary | ICD-10-CM | POA: Diagnosis not present

## 2019-07-03 DIAGNOSIS — I5032 Chronic diastolic (congestive) heart failure: Secondary | ICD-10-CM | POA: Diagnosis not present

## 2019-07-03 DIAGNOSIS — Z794 Long term (current) use of insulin: Secondary | ICD-10-CM | POA: Diagnosis not present

## 2019-07-03 DIAGNOSIS — S61402A Unspecified open wound of left hand, initial encounter: Secondary | ICD-10-CM | POA: Diagnosis not present

## 2019-07-03 DIAGNOSIS — T5491XA Toxic effect of unspecified corrosive substance, accidental (unintentional), initial encounter: Secondary | ICD-10-CM | POA: Diagnosis not present

## 2019-07-03 DIAGNOSIS — E11319 Type 2 diabetes mellitus with unspecified diabetic retinopathy without macular edema: Secondary | ICD-10-CM | POA: Diagnosis not present

## 2019-07-03 DIAGNOSIS — L97211 Non-pressure chronic ulcer of right calf limited to breakdown of skin: Secondary | ICD-10-CM | POA: Diagnosis not present

## 2019-07-03 DIAGNOSIS — J449 Chronic obstructive pulmonary disease, unspecified: Secondary | ICD-10-CM | POA: Diagnosis not present

## 2019-07-03 DIAGNOSIS — I110 Hypertensive heart disease with heart failure: Secondary | ICD-10-CM | POA: Diagnosis not present

## 2019-07-03 DIAGNOSIS — S61401A Unspecified open wound of right hand, initial encounter: Secondary | ICD-10-CM | POA: Diagnosis not present

## 2019-07-09 ENCOUNTER — Other Ambulatory Visit: Payer: Self-pay | Admitting: Cardiology

## 2019-07-09 MED ORDER — POTASSIUM CHLORIDE ER 20 MEQ PO TBCR: 20.0000 meq | EXTENDED_RELEASE_TABLET | Freq: Every day | ORAL | 2 refills | Status: DC

## 2019-07-09 NOTE — Telephone Encounter (Signed)
Take 20 mEq of potassium everyday. Send to [place] on chart. Keep f/u w/PCP.I will see her in 2-3 weeks

## 2019-07-09 NOTE — Telephone Encounter (Signed)
Pt aware. Call transferred to FE to schedule.//ah

## 2019-07-09 NOTE — Telephone Encounter (Signed)
Pt called back stating that she held torsemide for 3 days and edema improved but returned so she is back to taking it and is experiencing cramping now. She has a f/u with her pcp on Mon.//ah

## 2019-07-10 ENCOUNTER — Encounter: Payer: BC Managed Care – PPO | Admitting: Internal Medicine

## 2019-07-10 ENCOUNTER — Other Ambulatory Visit: Payer: Self-pay

## 2019-07-10 DIAGNOSIS — E11622 Type 2 diabetes mellitus with other skin ulcer: Secondary | ICD-10-CM | POA: Diagnosis not present

## 2019-07-10 DIAGNOSIS — I5032 Chronic diastolic (congestive) heart failure: Secondary | ICD-10-CM | POA: Diagnosis not present

## 2019-07-10 DIAGNOSIS — E11319 Type 2 diabetes mellitus with unspecified diabetic retinopathy without macular edema: Secondary | ICD-10-CM | POA: Diagnosis not present

## 2019-07-10 DIAGNOSIS — G4733 Obstructive sleep apnea (adult) (pediatric): Secondary | ICD-10-CM | POA: Diagnosis not present

## 2019-07-10 DIAGNOSIS — Z794 Long term (current) use of insulin: Secondary | ICD-10-CM | POA: Diagnosis not present

## 2019-07-10 DIAGNOSIS — S61202A Unspecified open wound of right middle finger without damage to nail, initial encounter: Secondary | ICD-10-CM | POA: Diagnosis not present

## 2019-07-10 DIAGNOSIS — J449 Chronic obstructive pulmonary disease, unspecified: Secondary | ICD-10-CM | POA: Diagnosis not present

## 2019-07-10 DIAGNOSIS — E1142 Type 2 diabetes mellitus with diabetic polyneuropathy: Secondary | ICD-10-CM | POA: Diagnosis not present

## 2019-07-10 DIAGNOSIS — S61401A Unspecified open wound of right hand, initial encounter: Secondary | ICD-10-CM | POA: Diagnosis not present

## 2019-07-10 DIAGNOSIS — I110 Hypertensive heart disease with heart failure: Secondary | ICD-10-CM | POA: Diagnosis not present

## 2019-07-10 DIAGNOSIS — L97211 Non-pressure chronic ulcer of right calf limited to breakdown of skin: Secondary | ICD-10-CM | POA: Diagnosis not present

## 2019-07-10 DIAGNOSIS — I890 Lymphedema, not elsewhere classified: Secondary | ICD-10-CM | POA: Diagnosis not present

## 2019-07-10 DIAGNOSIS — T5491XA Toxic effect of unspecified corrosive substance, accidental (unintentional), initial encounter: Secondary | ICD-10-CM | POA: Diagnosis not present

## 2019-07-10 DIAGNOSIS — S61402A Unspecified open wound of left hand, initial encounter: Secondary | ICD-10-CM | POA: Diagnosis not present

## 2019-07-10 DIAGNOSIS — I2510 Atherosclerotic heart disease of native coronary artery without angina pectoris: Secondary | ICD-10-CM | POA: Diagnosis not present

## 2019-07-10 DIAGNOSIS — F17200 Nicotine dependence, unspecified, uncomplicated: Secondary | ICD-10-CM | POA: Diagnosis not present

## 2019-07-10 DIAGNOSIS — Z86718 Personal history of other venous thrombosis and embolism: Secondary | ICD-10-CM | POA: Diagnosis not present

## 2019-07-11 NOTE — Progress Notes (Signed)
BRADEE, ZHAI ([id number]) Visit Report for 07/10/2019 Debridement Details Patient Name: [name], [name] Date of Service: 07/10/2019 9:00 AM Medical Record Number: [id number] Patient Account Number: [id number] Date of Birth/Sex: [birth year] (48 y.o. F) Treating RN: Primary Care Provider: Alma Friendly Other Clinician: Referring Provider: Alma Friendly Treating Provider/Extender: Tito Dine in Treatment: 3 Debridement Performed for Wound #5 Right Hand - 3rd Digit Assessment: Performed By: Physician Ricard Dillon, MD Debridement Type: Debridement Level of Consciousness (Pre- Awake and Alert procedure): Pre-procedure Verification/Time Yes - 09:52 Out Taken: Start Time: 09:52 Pain Control: Lidocaine Total Area Debrided (L x W): 0.5 (cm) x 0.5 (cm) = 0.25 (cm) Tissue and other material Viable, Non-Viable, Slough, Subcutaneous, Slough debrided: Level: Skin/Subcutaneous Tissue Debridement Description: Excisional Instrument: Curette Bleeding: None End Time: 09:54 Procedural Pain: 8 Post Procedural Pain: 1 Response to Treatment: Procedure was tolerated well Level of Consciousness Awake and Alert (Post-procedure): Post Debridement Measurements of Total Wound Length: (cm) 0.5 Width: (cm) 0.5 Depth: (cm) 0.3 Volume: (cm) 0.059 Character of Wound/Ulcer Post Debridement: Improved Post Procedure Diagnosis Same as Pre-procedure Electronic Signature(s) Signed: 07/10/2019 4:48:37 PM By: Linton Ham MD Entered By: Linton Ham on 07/10/2019 10:15:23 Pherigo, [name] ([id number]) -------------------------------------------------------------------------------- HPI Details Patient Name: [name] Date of Service: 07/10/2019 9:00 AM Medical Record Number: [id number] Patient Account Number: [id number] Date of Birth/Sex: 18-Oct-1970 (48 y.o. F) Treating RN: Primary Care Provider: Alma Friendly Other Clinician: Referring Provider: Alma Friendly Treating Provider/Extender: Tito Dine in Treatment: 3 History of Present Illness HPI Description: ADMISSION 06/19/2019 Patient is a 48 year old woman who is accompanied by her husband. She has severe diabetic peripheral neuropathy which is left her minimally ambulatory. She spends most of the time in a wheelchair. She also has a history of chronic lower extremity edema history of left leg DVT. She states that on 2 separate occasions roughly a month ago she fell with lacerations on the bilateral anterior tibial areas. She was seen by primary care on 06/04/2019 diagnosed with cellulitis of the left leg put on Bactrim DS. Also noted to have hand wounds with a question of referral to hand surgery. She was also sent to our clinic at the same time. The patient's husband is doing the dressings. He has been using silver alginate that was supplied by home health. He is done a really good job the area on the left leg is actually healed only a small superficial area on the right Somewhere in the in the last 2 weeks she gives a bizarre history of going to wash nail polish off the fingers of both hands. She apparently did not realize that her son had put Clorox in the vinegar jar that they used to clean the countertop. She ended up burning her hands. The patient has a wound on the PIP of the left third finger, PIP of the right second finger and the lateral part of the right third finger Past medical history includes COPD, obstructive sleep apnea on nocturnal O2, type 2 diabetes with peripheral neuropathy, retinopathy, chronic diastolic heart failure coronary artery disease, severe diabetic neuropathy type 2 diabetes apparently diagnosed at age 62 on insulin, COPD, continued tobacco abuse, left leg deep DVT, hypertension, TIAs, seizure disorders, asthma The patient is actually had arterial studies in February of this year. ABIs bilaterally were 1.02 and TBI's bilaterally at  0.6. Waveforms were triphasic and biphasic. She was not felt to have significant arterial disease. 12/23; patient readmitted the clinic last week. She has predominantly venous insufficiency  wounds on the right medial lower leg. She also had open areas on the fingers of both hands which happened in a very bizarre way. She has almost complete healing on the right medial calf. The area on the left third finger is healed. She still has 2 open wounds on the medial aspect of the PIP of the right second finger and the dorsal aspect of the right PIP of the third finger. Of these 2 wounds the PIP of the third finger wound is painful and deeper 12/30; the areas on her right medial calf are closed. She likely has some degree of chronic venous insufficiency and lymphedema. Also very xerotic skin which I have recommended a moisturizing. I did not bring up the issue of compression stockings The areas we are still looking out are on the PIP of the right third finger and the medial part of the PIP of the right second finger. The second finger area is just about closed. The problem is over the PIP of the right third finger. This does not look infected and she is completing her antibiotics from last week. However this is a deep wound at the base of this there is either tendon or periosteum. She she has some healthy granulation but we are not seeing that close over the bottom part of the wound. We are probably going to need to immobilize the finger somewhat so that she does not continuously pull the tissue apart Electronic Signature(s) Signed: 07/10/2019 4:48:37 PM By: Linton Ham MD Entered By: Linton Ham on 07/10/2019 10:17:31 [name] -------------------------------------------------------------------------------- Physical Exam Details Patient Name: [name] Date of Service: 07/10/2019 9:00 AM Medical Record Number: [id number] Patient Account Number: [id number] Date of  Birth/Sex: [birth year] (48 y.o. F) Treating RN: Primary Care Provider: Alma Friendly Other Clinician: Referring Provider: Alma Friendly Treating Provider/Extender: Tito Dine in Treatment: 3 Constitutional Sitting or standing Blood Pressure is within target range for patient.. Pulse regular and within target range for patient.Marland Kitchen Respirations regular, non-labored and within target range.. Temperature is normal and within the target range for the patient.Marland Kitchen appears in no distress. Notes Wound exam; lower extremity wounds are healed. She has very dry xerotic leathery skin. oFingers of her right hand especially the right PIP dorsally. Debrided necrotic subcutaneous tissue and slough from the base of this with a 3 curette. There is either exposed tendon here or periosteum there is not enough for this to be certain she does have healthy granulation. No overt infection Electronic Signature(s) Signed: 07/10/2019 4:48:37 PM By: Linton Ham MD Entered By: Linton Ham on 07/10/2019 10:18:45 [name] -------------------------------------------------------------------------------- Physician Orders Details Patient Name: [name] Date of Service: 07/10/2019 9:00 AM Medical Record Number: [id number] Patient Account Number: [id number] Date of Birth/Sex: 10-18-1970 (48 y.o. F) Treating RN: Harold Barban Primary Care Provider: Alma Friendly Other Clinician: Referring Provider: Alma Friendly Treating Provider/Extender: Tito Dine in Treatment: 3 Verbal / Phone Orders: No Diagnosis Coding Wound Cleansing Wound #4 Right Hand - 2nd Digit o Clean wound with Normal Saline. o May Shower, gently pat wound dry prior to applying new dressing. Wound #5 Right Hand - 3rd Digit o Clean wound with Normal Saline. o May Shower, gently pat wound dry prior to applying new dressing. Anesthetic (add to Medication List) Wound #4 Right Hand - 2nd  Digit o Topical Lidocaine 4% cream applied to wound bed prior to debridement (In Clinic Only). Wound #5 Right Hand - 3rd Digit o Topical Lidocaine 4% cream  applied to wound bed prior to debridement (In Clinic Only). Primary Wound Dressing Wound #4 Right Hand - 2nd Digit o Silver Collagen - moistened with saline Wound #5 Right Hand - 3rd Digit o Silver Collagen - moistened with saline Secondary Dressing Wound #4 Right Hand - 2nd Digit o Non-adherent pad - Conform and stretch-net to secure Wound #5 Right Hand - 3rd Digit o Non-adherent pad - Conform and stretch-net to secure Dressing Change Frequency Wound #4 Right Hand - 2nd Digit o Change Dressing Monday, Wednesday, Friday Wound #5 Right Hand - 3rd Digit o Change Dressing Monday, Wednesday, Friday Follow-up Appointments Wound #4 Right Hand - 2nd Digit o Return Appointment in 1 week. Wound #5 Right Hand - 3rd Digit o Return Appointment in 1 week. SAMYUKTA, ORREN ([id number]) Home Health Wound #4 Right Hand - 2nd Digit o [place] Nurse may visit PRN to address patientos wound care needs. o FACE TO FACE ENCOUNTER: MEDICARE and MEDICAID PATIENTS: I certify that this patient is under my care and that I had a face-to-face encounter that meets the physician face-to-face encounter requirements with this patient on this date. The encounter with the patient was in whole or in part for the following MEDICAL CONDITION: (primary reason for [place]) MEDICAL NECESSITY: I certify, that based on my findings, NURSING services are a medically necessary home health service. HOME BOUND STATUS: I certify that my clinical findings support that this patient is homebound (i.e., Due to illness or injury, pt requires aid of supportive devices such as crutches, cane, wheelchairs, walkers, the use of special transportation or the assistance of another person to leave their  place of residence. There is a normal inability to leave the home and doing so requires considerable and taxing effort. Other absences are for medical reasons / religious services and are infrequent or of short duration when for other reasons). o If current dressing causes regression in wound condition, may D/C ordered dressing product/s and apply Normal Saline Moist Dressing daily until next [place] / Other MD appointment. [place] of regression in wound condition at [phone]. o Please direct any NON-WOUND related issues/requests for orders to patient's Primary Care Physician Wound #5 Right Hand - 3rd Digit o [place] Nurse may visit PRN to address patientos wound care needs. o FACE TO FACE ENCOUNTER: MEDICARE and MEDICAID PATIENTS: I certify that this patient is under my care and that I had a face-to-face encounter that meets the physician face-to-face encounter requirements with this patient on this date. The encounter with the patient was in whole or in part for the following MEDICAL CONDITION: (primary reason for [place]) MEDICAL NECESSITY: I certify, that based on my findings, NURSING services are a medically necessary home health service. HOME BOUND STATUS: I certify that my clinical findings support that this patient is homebound (i.e., Due to illness or injury, pt requires aid of supportive devices such as crutches, cane, wheelchairs, walkers, the use of special transportation or the assistance of another person to leave their place of residence. There is a normal inability to leave the home and doing so requires considerable and taxing effort. Other absences are for medical reasons / religious services and are infrequent or of short duration when for other reasons). o If current dressing causes regression in wound condition, may D/C ordered dressing product/s and apply Normal  Saline Moist Dressing daily until  next [place] / Other MD appointment. [place] of regression in wound condition at [phone]. o Please direct any NON-WOUND related issues/requests for orders to patient's Primary Care Physician Medications-please add to medication list. Wound #4 Right Hand - 2nd Digit o P.O. Antibiotics - Doxycycline twice daily for 7 days Wound #5 Right Hand - 3rd Digit o P.O. Antibiotics - Doxycycline twice daily for 7 days Electronic Signature(s) Signed: 07/10/2019 3:13:29 PM By: Harold Barban Signed: 07/10/2019 4:48:37 PM By: Linton Ham MD Entered By: Harold Barban on 07/10/2019 09:57:41 [name], [name] -------------------------------------------------------------------------------- Problem List Details Patient Name: [name] Date of Service: 07/10/2019 9:00 AM Medical Record Number: [id number] Patient Account Number: [id number] Date of Birth/Sex: 10/13/1970 (48 y.o. F) Treating RN: Primary Care Provider: Alma Friendly Other Clinician: Referring Provider: Alma Friendly Treating Provider/Extender: Tito Dine in Treatment: 3 Active Problems ICD-10 Evaluated Encounter Code Description Active Date Today Diagnosis S61.401D Unspecified open wound of right hand, subsequent encounter 06/19/2019 No Yes E11.622 Type 2 diabetes mellitus with other skin ulcer 06/19/2019 No Yes Inactive Problems ICD-10 Code Description Active Date Inactive Date L97.211 Non-pressure chronic ulcer of right calf limited to breakdown of skin 06/19/2019 06/19/2019 L97.821 Non-pressure chronic ulcer of other part of left lower leg limited to 06/19/2019 06/19/2019 breakdown of skin I89.0 Lymphedema, not elsewhere classified 06/19/2019 06/19/2019 S61.402D Unspecified open wound of left hand, subsequent encounter 06/19/2019 06/19/2019 Resolved Problems Electronic Signature(s) Signed: 07/10/2019 4:48:37 PM By: Linton Ham MD Entered By: Linton Ham on 07/10/2019 10:15:03 [name], [name] -------------------------------------------------------------------------------- Progress Note Details Patient Name: [name] Date of Service: 07/10/2019 9:00 AM Medical Record Number: [id number] Patient Account Number: [id number] Date of Birth/Sex: [birth year] (48 y.o. F) Treating RN: Primary Care Provider: Alma Friendly Other Clinician: Referring Provider: Alma Friendly Treating Provider/Extender: Tito Dine in Treatment: 3 Subjective History of Present Illness (HPI) ADMISSION 06/19/2019 Patient is a 49 year old woman who is accompanied by her husband. She has severe diabetic peripheral neuropathy which is left her minimally ambulatory. She spends most of the time in a wheelchair. She also has a history of chronic lower extremity edema history of left leg DVT. She states that on 2 separate occasions roughly a month ago she fell with lacerations on the bilateral anterior tibial areas. She was seen by primary care on 06/04/2019 diagnosed with cellulitis of the left leg put on Bactrim DS. Also noted to have hand wounds with a question of referral to hand surgery. She was also sent to our clinic at the same time. The patient's husband is doing the dressings. He has been using silver alginate that was supplied by home health. He is done a really good job the area on the left leg is actually healed only a small superficial area on the right Somewhere in the in the last 2 weeks she gives a bizarre history of going to wash nail polish off the fingers of both hands. She apparently did not realize that her son had put Clorox in the vinegar jar that they used to clean the countertop. She ended up burning her hands. The patient has a wound on the PIP of the left third finger, PIP of the right second finger and the lateral part of the right third finger Past medical history includes COPD,  obstructive sleep apnea on nocturnal O2, type 2 diabetes with peripheral neuropathy, retinopathy, chronic diastolic heart failure coronary artery disease, severe diabetic neuropathy type 2 diabetes apparently diagnosed at age 89 on  insulin, COPD, continued tobacco abuse, left leg deep DVT, hypertension, TIAs, seizure disorders, asthma The patient is actually had arterial studies in February of this year. ABIs bilaterally were 1.02 and TBI's bilaterally at 0.6. Waveforms were triphasic and biphasic. She was not felt to have significant arterial disease. 12/23; patient readmitted the clinic last week. She has predominantly venous insufficiency wounds on the right medial lower leg. She also had open areas on the fingers of both hands which happened in a very bizarre way. She has almost complete healing on the right medial calf. The area on the left third finger is healed. She still has 2 open wounds on the medial aspect of the PIP of the right second finger and the dorsal aspect of the right PIP of the third finger. Of these 2 wounds the PIP of the third finger wound is painful and deeper 12/30; the areas on her right medial calf are closed. She likely has some degree of chronic venous insufficiency and lymphedema. Also very xerotic skin which I have recommended a moisturizing. I did not bring up the issue of compression stockings The areas we are still looking out are on the PIP of the right third finger and the medial part of the PIP of the right second finger. The second finger area is just about closed. The problem is over the PIP of the right third finger. This does not look infected and she is completing her antibiotics from last week. However this is a deep wound at the base of this there is either tendon or periosteum. She she has some healthy granulation but we are not seeing that close over the bottom part of the wound. We are probably going to need to immobilize the finger somewhat so that  she does not continuously pull the tissue apart [name], [name] Objective Constitutional Sitting or standing Blood Pressure is within target range for patient.. Pulse regular and within target range for patient.Marland Kitchen Respirations regular, non-labored and within target range.. Temperature is normal and within the target range for the patient.Marland Kitchen appears in no distress. Vitals Time Taken: 9:29 AM, Height: 64 in, Weight: 286 lbs, BMI: 49.1, Temperature: 97.5 F, Pulse: 81 bpm, Respiratory Rate: 16 breaths/min, Blood Pressure: 139/71 mmHg. General Notes: Wound exam; lower extremity wounds are healed. She has very dry xerotic leathery skin. Fingers of her right hand especially the right PIP dorsally. Debrided necrotic subcutaneous tissue and slough from the base of this with a 3 curette. There is either exposed tendon here or periosteum there is not enough for this to be certain she does have healthy granulation. No overt infection Integumentary (Hair, Skin) Wound #2 status is Healed - Epithelialized. Original cause of wound was Trauma. The wound is located on the Right,Medial Lower Leg. The wound measures 0cm length x 0cm width x 0cm depth; 0cm^2 area and 0cm^3 volume. There is no tunneling or undermining noted. There is a medium amount of sanguinous drainage noted. The wound margin is thickened. There is no granulation within the wound bed. There is a large (67-100%) amount of necrotic tissue within the wound bed including Eschar. Wound #4 status is Open. Original cause of wound was Trauma. The wound is located on the Right Hand - 2nd Digit. The wound measures 0.5cm length x 0.3cm width x 0.1cm depth; 0.118cm^2 area and 0.012cm^3 volume. There is Fat Layer (Subcutaneous Tissue) Exposed exposed. There is a medium amount of sanguinous drainage noted. The wound margin is thickened. There is no granulation within  the wound bed. There is a large (67-100%) amount of necrotic tissue within  the wound bed including Adherent Slough. Wound #5 status is Open. Original cause of wound was Trauma. The wound is located on the Right Hand - 3rd Digit. The wound measures 0.5cm length x 0.5cm width x 0.3cm depth; 0.196cm^2 area and 0.059cm^3 volume. There is Fat Layer (Subcutaneous Tissue) Exposed exposed. There is a medium amount of sanguinous drainage noted. The wound margin is thickened. There is no granulation within the wound bed. There is a large (67-100%) amount of necrotic tissue within the wound bed including Adherent Slough. Assessment Active Problems ICD-10 Unspecified open wound of right hand, subsequent encounter Type 2 diabetes mellitus with other skin ulcer Procedures Wound #5 [name], [name] Pre-procedure diagnosis of Wound #5 is a Trauma, Other located on the Right Hand - 3rd Digit . There was a Excisional Skin/Subcutaneous Tissue Debridement with a total area of 0.25 sq cm performed by Ricard Dillon, MD. With the following instrument(s): Curette to remove Viable and Non-Viable tissue/material. Material removed includes Subcutaneous Tissue and Slough and after achieving pain control using Lidocaine. No specimens were taken. A time out was conducted at 09:52, prior to the start of the procedure. There was no bleeding. The procedure was tolerated well with a pain level of 8 throughout and a pain level of 1 following the procedure. Post Debridement Measurements: 0.5cm length x 0.5cm width x 0.3cm depth; 0.059cm^3 volume. Character of Wound/Ulcer Post Debridement is improved. Post procedure Diagnosis Wound #5: Same as Pre-Procedure Plan Wound Cleansing: Wound #4 Right Hand - 2nd Digit: Clean wound with Normal Saline. May Shower, gently pat wound dry prior to applying new dressing. Wound #5 Right Hand - 3rd Digit: Clean wound with Normal Saline. May Shower, gently pat wound dry prior to applying new dressing. Anesthetic (add to Medication List): Wound #4  Right Hand - 2nd Digit: Topical Lidocaine 4% cream applied to wound bed prior to debridement (In Clinic Only). Wound #5 Right Hand - 3rd Digit: Topical Lidocaine 4% cream applied to wound bed prior to debridement (In Clinic Only). Primary Wound Dressing: Wound #4 Right Hand - 2nd Digit: Silver Collagen - moistened with saline Wound #5 Right Hand - 3rd Digit: Silver Collagen - moistened with saline Secondary Dressing: Wound #4 Right Hand - 2nd Digit: Non-adherent pad - Conform and stretch-net to secure Wound #5 Right Hand - 3rd Digit: Non-adherent pad - Conform and stretch-net to secure Dressing Change Frequency: Wound #4 Right Hand - 2nd Digit: Change Dressing Monday, Wednesday, Friday Wound #5 Right Hand - 3rd Digit: Change Dressing Monday, Wednesday, Friday Follow-up Appointments: Wound #4 Right Hand - 2nd Digit: Return Appointment in 1 week. Wound #5 Right Hand - 3rd Digit: Return Appointment in 1 week. Home Health: Wound #4 Right Hand - 2nd Digit: [place] Nurse may visit PRN to address patient s wound care needs. FACE TO FACE ENCOUNTER: MEDICARE and MEDICAID PATIENTS: I certify that this patient is under my care and that I had a face-to-face encounter that meets the physician face-to-face encounter requirements with this patient on this date. The encounter with the patient was in whole or in part for the following MEDICAL CONDITION: (primary reason for [place]) MEDICAL NECESSITY: I certify, that based on my findings, NURSING services are a medically necessary home health service. HOME BOUND STATUS: I certify that my clinical findings support that this patient is homebound (i.e., Due to illness  or injury, pt requires aid of supportive devices such as crutches, cane, wheelchairs, walkers, the use of special [place], [place] ([id number]) transportation or the assistance of another person to leave their place of residence.  There is a normal inability to leave the home and doing so requires considerable and taxing effort. Other absences are for medical reasons / religious services and are infrequent or of short duration when for other reasons). If current dressing causes regression in wound condition, may D/C ordered dressing product/s and apply Normal Saline Moist Dressing daily until next [place] / Other MD appointment. [place] of regression in wound condition at [phone]. Please direct any NON-WOUND related issues/requests for orders to patient's Primary Care Physician Wound #5 Right Hand - 3rd Digit: [place] Nurse may visit PRN to address patient s wound care needs. FACE TO FACE ENCOUNTER: MEDICARE and MEDICAID PATIENTS: I certify that this patient is under my care and that I had a face-to-face encounter that meets the physician face-to-face encounter requirements with this patient on this date. The encounter with the patient was in whole or in part for the following MEDICAL CONDITION: (primary reason for [place]) MEDICAL NECESSITY: I certify, that based on my findings, NURSING services are a medically necessary home health service. HOME BOUND STATUS: I certify that my clinical findings support that this patient is homebound (i.e., Due to illness or injury, pt requires aid of supportive devices such as crutches, cane, wheelchairs, walkers, the use of special transportation or the assistance of another person to leave their place of residence. There is a normal inability to leave the home and doing so requires considerable and taxing effort. Other absences are for medical reasons / religious services and are infrequent or of short duration when for other reasons). If current dressing causes regression in wound condition, may D/C ordered dressing product/s and apply Normal Saline Moist Dressing daily until next [place] / Other MD appointment. [place] of regression in wound condition at [phone]. Please direct any NON-WOUND related issues/requests for orders to patient's Primary Care Physician Medications-please add to medication list.: Wound #4 Right Hand - 2nd Digit: P.O. Antibiotics - Doxycycline twice daily for 7 days Wound #5 Right Hand - 3rd Digit: P.O. Antibiotics - Doxycycline twice daily for 7 days 1. I am continuing with silver collagen to the hand wounds 2. The most worrisome area here is the PIP of the right third finger. The right second finger is just about closed. 3. I emphasized it is probably worthwhile putting of the brace on the right third finger so that every time she flexes the finger she is not pulling tissue apart. Electronic Signature(s) Signed: 07/10/2019 4:48:37 PM By: Linton Ham MD Entered By: Linton Ham on 07/10/2019 10:20:11 [name], [name] -------------------------------------------------------------------------------- SuperBill Details Patient Name: [name] Date of Service: 07/10/2019 Medical Record Number: [id number] Patient Account Number: [id number] Date of Birth/Sex: 1970/07/16 (48 y.o. F) Treating RN: Primary Care Provider: Alma Friendly Other Clinician: Referring Provider: Alma Friendly Treating Provider/Extender: Tito Dine in Treatment: 3 Diagnosis Coding ICD-10 Codes Code Description S61.401D Unspecified open wound of right hand, subsequent encounter E11.622 Type 2 diabetes mellitus with other skin ulcer Facility Procedures CPT4 Code: [id number] Description: [id number] - DEB SUBQ TISSUE 20 SQ CM/< ICD-10 Diagnosis Description S61.401D Unspecified open wound of right hand, subsequent encou E11.622 Type 2 diabetes mellitus with other skin ulcer Modifier: nter Quantity: 1  Physician Procedures CPT4 Code: [id number] Description: [id number] - WC PHYS SUBQ TISS 20 SQ CM ICD-10 Diagnosis  Description [id number] Unspecified open wound of right hand, subsequent encou E11.622 Type 2 diabetes mellitus with other skin ulcer Modifier: nter Quantity: 1 Electronic Signature(s) Signed: 07/10/2019 4:48:37 PM By: Linton Ham MD Entered By: Linton Ham on 07/10/2019 10:20:24

## 2019-07-11 NOTE — Progress Notes (Signed)
TOMESHIA, NISSLEY ([id number]) Visit Report for 07/10/2019 Arrival Information Details Patient Name: MARZELLE, ARABIA Date of Service: 07/10/2019 9:00 AM Medical Record Number: [id number] Patient Account Number: [id number] Date of Birth/Sex: 01/08/1971 (48 y.o. F) Treating RN: Army Melia Primary Care Shaquia Berkley: Alma Friendly Other Clinician: Referring Malashia Kamaka: Alma Friendly Treating Micharl Helmes/Extender: Tito Dine in Treatment: 3 Visit Information History Since Last Visit Added or deleted any medications: No Patient Arrived: Wheel Chair Any new allergies or adverse reactions: No Arrival Time: 09:28 Had a fall or experienced change in No Accompanied By: husband activities of daily living that may affect Transfer Assistance: None risk of falls: Patient Identification Verified: Yes Signs or symptoms of abuse/neglect since last visito No Patient Has Alerts: Yes Hospitalized since last visit: No Patient Alerts: ABI L/R 1.02 TBI L/R 0.6 Has Dressing in Place as Prescribed: Yes Pain Present Now: No Electronic Signature(s) Signed: 07/10/2019 9:57:21 AM By: Army Melia Entered By: Army Melia on 07/10/2019 09:28:59 Coglianese, [name] ([id number]) -------------------------------------------------------------------------------- Encounter Discharge Information Details Patient Name: [name] Date of Service: 07/10/2019 9:00 AM Medical Record Number: [id number] Patient Account Number: [id number] Date of Birth/Sex: Jan 09, 1971 (48 y.o. F) Treating RN: Harold Barban Primary Care Mackynzie Woolford: Alma Friendly Other Clinician: Referring Remus Hagedorn: Alma Friendly Treating Maudene Stotler/Extender: Tito Dine in Treatment: 3 Encounter Discharge Information Items Post Procedure Vitals Discharge Condition: Stable Temperature (F): 97.5 Ambulatory Status: Wheelchair Pulse (bpm): 81 Discharge Destination: Home Respiratory Rate (breaths/min): 18 Transportation: Private  Auto Blood Pressure (mmHg): 139/71 Schedule Follow-up Appointment: Yes Clinical Summary of Care: Electronic Signature(s) Signed: 07/10/2019 3:13:29 PM By: Harold Barban Entered By: Harold Barban on 07/10/2019 09:59:31 Lupercio, [name] ([id number]) -------------------------------------------------------------------------------- Lower Extremity Assessment Details Patient Name: [name] Date of Service: 07/10/2019 9:00 AM Medical Record Number: [id number] Patient Account Number: [id number] Date of Birth/Sex: [birth year] (48 y.o. F) Treating RN: Army Melia Primary Care Allyssa Abruzzese: Alma Friendly Other Clinician: Referring Suriyah Vergara: Alma Friendly Treating Keileigh Vahey/Extender: Ricard Dillon Weeks in Treatment: 3 Edema Assessment Assessed: [Left: No] [Right: No] Edema: [Left: N] [Right: o] Vascular Assessment Pulses: Dorsalis Pedis Palpable: [Right:Yes] Electronic Signature(s) Signed: 07/10/2019 9:57:21 AM By: Army Melia Entered By: Army Melia on 07/10/2019 09:35:48 Ryther, Marija ([id number]) -------------------------------------------------------------------------------- Multi Wound Chart Details Patient Name: [name] Date of Service: 07/10/2019 9:00 AM Medical Record Number: [id number] Patient Account Number: [id number] Date of Birth/Sex: 1971/04/27 (48 y.o. F) Treating RN: Harold Barban Primary Care Numa Heatwole: Alma Friendly Other Clinician: Referring Zola Runion: Alma Friendly Treating Aston Lieske/Extender: Tito Dine in Treatment: 3 Vital Signs Height(in): 64 Pulse(bpm): 79 Weight(lbs): 286 Blood Pressure(mmHg): 139/71 Body Mass Index(BMI): 49 Temperature(F): 97.5 Respiratory Rate 16 (breaths/min): Photos: Wound Location: Right, Medial Lower Leg Right Hand - 2nd Digit Right Hand - 3rd Digit Wounding Event: Trauma Trauma Trauma Primary Etiology: Diabetic Wound/Ulcer of the Trauma, Other Trauma, Other Lower Extremity Comorbid  History: Asthma, Chronic Obstructive Asthma, Chronic Obstructive Asthma, Chronic Obstructive Pulmonary Disease (COPD), Pulmonary Disease (COPD), Pulmonary Disease (COPD), Sleep Apnea, Arrhythmia, Sleep Apnea, Arrhythmia, Sleep Apnea, Arrhythmia, Congestive Heart Failure, Congestive Heart Failure, Congestive Heart Failure, Coronary Artery Disease, Coronary Artery Disease, Coronary Artery Disease, Hypertension, Type II Diabetes Hypertension, Type II Diabetes Hypertension, Type II Diabetes Date Acquired: 05/20/2019 05/20/2019 05/20/2019 Weeks of Treatment: 3 3 3  Wound Status: Healed - Epithelialized Open Open Measurements L x W x D 0x0x0 0.5x0.3x0.1 0.5x0.5x0.3 (cm) Area (cm) : 0 0.118 0.196 Volume (cm) : 0 0.012 0.059 % Reduction in Area: 100.00% 50.00% 65.30% % Reduction in Volume: 100.00% 91.50% 47.80% Classification:  Grade 2 Full Thickness Without Full Thickness Without Exposed Support Structures Exposed Support Structures Exudate Amount: Medium Medium Medium Exudate Type: Sanguinous Sanguinous Sanguinous Exudate Color: red red red Wound Margin: Thickened Thickened Thickened Granulation Amount: None Present (0%) None Present (0%) None Present (0%) Necrotic Amount: Large (67-100%) Large (67-100%) Large (67-100%) Necrotic Tissue: Eschar Adherent [place] Exposed Structures: [place], ZADA ([id number]) Fascia: No Fat Layer (Subcutaneous Fat Layer (Subcutaneous Fat Layer (Subcutaneous Tissue) Exposed: Yes Tissue) Exposed: Yes Tissue) Exposed: No Fascia: No Fascia: No Tendon: No Tendon: No Tendon: No Muscle: No Muscle: No Muscle: No Joint: No Joint: No Joint: No Bone: No Bone: No Bone: No Epithelialization: None None None Debridement: N/A N/A Debridement - Excisional Pre-procedure N/A N/A 09:52 Verification/Time Out Taken: Pain Control: N/A N/A Lidocaine Tissue Debrided: N/A N/A Subcutaneous, Slough Level: N/A N/A Skin/Subcutaneous Tissue Debridement Area  (sq cm): N/A N/A 0.25 Instrument: N/A N/A Curette Bleeding: N/A N/A None Procedural Pain: N/A N/A 8 Post Procedural Pain: N/A N/A 1 Debridement Treatment N/A N/A Procedure was tolerated well Response: Post Debridement N/A N/A 0.5x0.5x0.3 Measurements L x W x D (cm) Post Debridement Volume: N/A N/A 0.059 (cm) Procedures Performed: N/A N/A Debridement Treatment Notes Wound #4 (Right Hand - 2nd Digit) Notes Silver collagen, non-adherent pad, conform Wound #5 (Right Hand - 3rd Digit) Notes Silver collagen, non-adherent pad, conform Electronic Signature(s) Signed: 07/10/2019 4:48:37 PM By: Linton Ham MD Entered By: Linton Ham on 07/10/2019 10:15:11 Freet, [name] ([id number]) -------------------------------------------------------------------------------- Multi-Disciplinary Care Plan Details Patient Name: [name] Date of Service: 07/10/2019 9:00 AM Medical Record Number: [id number] Patient Account Number: [id number] Date of Birth/Sex: 06-14-1971 (48 y.o. F) Treating RN: Harold Barban Primary Care Shatoria Stooksbury: Alma Friendly Other Clinician: Referring Aadvik Roker: Alma Friendly Treating Holbert Caples/Extender: Tito Dine in Treatment: 3 Active Inactive Abuse / Safety / Falls / Self Care Management Nursing Diagnoses: History of Falls Goals: Patient will not develop complications from immobility Date Initiated: 06/19/2019 Target Resolution Date: 07/10/2019 Goal Status: Active Patient will remain injury free related to falls Date Initiated: 06/19/2019 Target Resolution Date: 07/10/2019 Goal Status: Active Interventions: Assess fall risk on admission and as needed Treatment Activities: Patient referred to home care : 06/19/2019 Notes: Orientation to the Wound Care Program Nursing Diagnoses: Knowledge deficit related to the wound healing center program Goals: Patient/caregiver will verbalize understanding of the [facility] Date  Initiated: 06/19/2019 Target Resolution Date: 07/10/2019 Goal Status: Active Interventions: Provide education on orientation to the wound center Notes: Peripheral Neuropathy Nursing Diagnoses: Knowledge deficit related to disease process and management of peripheral neurovascular dysfunction Potential alteration in peripheral tissue perfusion (select prior to confirmation of diagnosis) Messerschmidt, [name] ([id number]) Goals: Patient/caregiver will verbalize understanding of disease process and disease management Date Initiated: 06/19/2019 Target Resolution Date: 07/10/2019 Goal Status: Active Interventions: Assess signs and symptoms of neuropathy upon admission and as needed Treatment Activities: Patient referred for customized footwear/offloading : 06/19/2019 Notes: Wound/Skin Impairment Nursing Diagnoses: Impaired tissue integrity Goals: Patient/caregiver will verbalize understanding of skin care regimen Date Initiated: 06/19/2019 Target Resolution Date: 07/10/2019 Goal Status: Active Ulcer/skin breakdown will have a volume reduction of 30% by week 4 Date Initiated: 06/19/2019 Target Resolution Date: 07/20/2019 Goal Status: Active Interventions: Assess patient/caregiver ability to obtain necessary supplies Assess ulceration(s) every visit Treatment Activities: Skin care regimen initiated : 06/19/2019 Topical wound management initiated : 06/19/2019 Notes: Electronic Signature(s) Signed: 07/10/2019 3:13:29 PM By: Harold Barban Entered By: Harold Barban on 07/10/2019 09:50:53 Guerette, Felesha ([id number]) -------------------------------------------------------------------------------- Pain Assessment Details  Patient Name: UNICA, KRENZEL Date of Service: 07/10/2019 9:00 AM Medical Record Number: [id number] Patient Account Number: [id number] Date of Birth/Sex: 11-19-1970 (48 y.o. F) Treating RN: Army Melia Primary Care Daviyon Widmayer: Alma Friendly Other Clinician: Referring Raima Geathers:  Alma Friendly Treating Hasel Janish/Extender: Tito Dine in Treatment: 3 Active Problems Location of Pain Severity and Description of Pain Patient Has Paino Yes Site Locations Pain Location: Pain in Ulcers Rate the pain. Current Pain Level: 7 Pain Management and Medication Current Pain Management: Electronic Signature(s) Signed: 07/10/2019 9:57:21 AM By: Army Melia Entered By: Army Melia on 07/10/2019 09:29:16 Pincock, [name] ([id number]) -------------------------------------------------------------------------------- Patient/Caregiver Education Details Patient Name: [name] Date of Service: 07/10/2019 9:00 AM Medical Record Number: [id number] Patient Account Number: [id number] Date of Birth/Gender: March 09, 1971 (48 y.o. F) Treating RN: Harold Barban Primary Care Physician: Alma Friendly Other Clinician: Referring Physician: Alma Friendly Treating Physician/Extender: Tito Dine in Treatment: 3 Education Assessment Education Provided To: Patient Education Topics Provided Wound/Skin Impairment: Handouts: Caring for Your Ulcer Methods: Demonstration, Explain/Verbal Responses: State content correctly Electronic Signature(s) Signed: 07/10/2019 3:13:29 PM By: Harold Barban Entered By: Harold Barban on 07/10/2019 09:51:30 Barge, [name] ([id number]) -------------------------------------------------------------------------------- Wound Assessment Details Patient Name: [name] Date of Service: 07/10/2019 9:00 AM Medical Record Number: [id number] Patient Account Number: [id number] Date of Birth/Sex: [birth year] (48 y.o. F) Treating RN: Harold Barban Primary Care Delores Edelstein: Alma Friendly Other Clinician: Referring Alverta Caccamo: Alma Friendly Treating Claudina Oliphant/Extender: Tito Dine in Treatment: 3 Wound Status Wound Number: 2 Primary Diabetic Wound/Ulcer of the Lower Extremity Etiology: Wound Location: Right, Medial  Lower Leg Wound Healed - Epithelialized Wounding Event: Trauma Status: Date Acquired: 05/20/2019 Comorbid Asthma, Chronic Obstructive Pulmonary Disease Weeks Of Treatment: 3 History: (COPD), Sleep Apnea, Arrhythmia, Congestive Clustered Wound: No Heart Failure, Coronary Artery Disease, Hypertension, Type II Diabetes Photos Wound Measurements Length: (cm) 0 % Reduct Width: (cm) 0 % Reduct Depth: (cm) 0 Epitheli Area: (cm) 0 Tunneli Volume: (cm) 0 Undermi ion in Area: 100% ion in Volume: 100% alization: None ng: No ning: No Wound Description Classification: Grade 2 Wound Margin: Thickened Exudate Amount: Medium Exudate Type: Sanguinous Exudate Color: red Foul Odor After Cleansing: No Slough/Fibrino Yes Wound Bed Granulation Amount: None Present (0%) Exposed Structure Necrotic Amount: Large (67-100%) Fascia Exposed: No Necrotic Quality: Eschar Fat Layer (Subcutaneous Tissue) Exposed: No Tendon Exposed: No Muscle Exposed: No Joint Exposed: No Bone Exposed: No Lax, Adrina ([id number]) Electronic Signature(s) Signed: 07/10/2019 3:13:29 PM By: Harold Barban Entered By: Harold Barban on 07/10/2019 09:55:01 Barbian, [name] ([id number]) -------------------------------------------------------------------------------- Wound Assessment Details Patient Name: [name] Date of Service: 07/10/2019 9:00 AM Medical Record Number: [id number] Patient Account Number: [id number] Date of Birth/Sex: [birth year] (48 y.o. F) Treating RN: Army Melia Primary Care Sholom Dulude: Alma Friendly Other Clinician: Referring Smantha Boakye: Alma Friendly Treating Milanya Sunderland/Extender: Tito Dine in Treatment: 3 Wound Status Wound Number: 4 Primary Trauma, Other Etiology: Wound Location: Right Hand - 2nd Digit Wound Open Wounding Event: Trauma Status: Date Acquired: 05/20/2019 Comorbid Asthma, Chronic Obstructive Pulmonary Disease Weeks Of Treatment: 3 History: (COPD),  Sleep Apnea, Arrhythmia, Congestive Clustered Wound: No Heart Failure, Coronary Artery Disease, Hypertension, Type II Diabetes Photos Wound Measurements Length: (cm) 0.5 Width: (cm) 0.3 Depth: (cm) 0.1 Area: (cm) 0.118 Volume: (cm) 0.012 % Reduction in Area: 50% % Reduction in Volume: 91.5% Epithelialization: None Wound Description Full Thickness Without Exposed Support Classification: Structures Wound Margin: Thickened Exudate Medium Amount: Exudate Type: Sanguinous Exudate Color: red Foul Odor After Cleansing: No Slough/Fibrino Yes Wound Bed  Granulation Amount: None Present (0%) Exposed Structure Necrotic Amount: Large (67-100%) Fascia Exposed: No Necrotic Quality: Adherent [place] Fat Layer (Subcutaneous Tissue) Exposed: Yes Tendon Exposed: No Muscle Exposed: No Joint Exposed: No Bone Exposed: No Inboden, Keymoni ([id number]) Treatment Notes Wound #4 (Right Hand - 2nd Digit) Notes Silver collagen, non-adherent pad, conform Electronic Signature(s) Signed: 07/10/2019 9:57:21 AM By: Army Melia Entered By: Army Melia on 07/10/2019 09:34:30 Saladin, Derionna ([id number]) -------------------------------------------------------------------------------- Wound Assessment Details Patient Name: [name] Date of Service: 07/10/2019 9:00 AM Medical Record Number: [id number] Patient Account Number: [id number] Date of Birth/Sex: 03/23/1971 (48 y.o. F) Treating RN: Army Melia Primary Care Taleah Bellantoni: Alma Friendly Other Clinician: Referring Dayna Geurts: Alma Friendly Treating Zeba Luby/Extender: Tito Dine in Treatment: 3 Wound Status Wound Number: 5 Primary Trauma, Other Etiology: Wound Location: Right Hand - 3rd Digit Wound Open Wounding Event: Trauma Status: Date Acquired: 05/20/2019 Comorbid Asthma, Chronic Obstructive Pulmonary Disease Weeks Of Treatment: 3 History: (COPD), Sleep Apnea, Arrhythmia, Congestive Clustered Wound: No Heart  Failure, Coronary Artery Disease, Hypertension, Type II Diabetes Photos Wound Measurements Length: (cm) 0.5 Width: (cm) 0.5 Depth: (cm) 0.3 Area: (cm) 0.196 Volume: (cm) 0.059 % Reduction in Area: 65.3% % Reduction in Volume: 47.8% Epithelialization: None Wound Description Full Thickness Without Exposed Support Classification: Structures Wound Margin: Thickened Exudate Medium Amount: Exudate Type: Sanguinous Exudate Color: red Foul Odor After Cleansing: No Slough/Fibrino Yes Wound Bed Granulation Amount: None Present (0%) Exposed Structure Necrotic Amount: Large (67-100%) Fascia Exposed: No Necrotic Quality: Adherent [place] Fat Layer (Subcutaneous Tissue) Exposed: Yes Tendon Exposed: No Muscle Exposed: No Joint Exposed: No Bone Exposed: No Ploeger, [place] ([id number]) Treatment Notes Wound #5 (Right Hand - 3rd Digit) Notes Silver collagen, non-adherent pad, conform Electronic Signature(s) Signed: 07/10/2019 9:57:21 AM By: Army Melia Entered By: Army Melia on 07/10/2019 09:34:57 Mattos, Sarely ([id number]) -------------------------------------------------------------------------------- [place] Details Patient Name: [name] Date of Service: 07/10/2019 9:00 AM Medical Record Number: [id number] Patient Account Number: [id number] Date of Birth/Sex: 1970-11-29 (48 y.o. F) Treating RN: Army Melia Primary Care Arwyn Besaw: Alma Friendly Other Clinician: Referring Kadrian Partch: Alma Friendly Treating Shaquetta Arcos/Extender: Tito Dine in Treatment: 3 Vital Signs Time Taken: 09:29 Temperature (F): 97.5 Height (in): 64 Pulse (bpm): 81 Weight (lbs): 286 Respiratory Rate (breaths/min): 16 Body Mass Index (BMI): 49.1 Blood Pressure (mmHg): 139/71 Reference Range: 80 - 120 mg / dl Electronic Signature(s) Signed: 07/10/2019 9:57:21 AM By: Army Melia Entered By: Army Melia on 07/10/2019 09:29:32

## 2019-07-15 ENCOUNTER — Other Ambulatory Visit: Payer: Self-pay

## 2019-07-15 ENCOUNTER — Ambulatory Visit (INDEPENDENT_AMBULATORY_CARE_PROVIDER_SITE_OTHER): Payer: BC Managed Care – PPO | Admitting: Primary Care

## 2019-07-15 ENCOUNTER — Telehealth: Payer: Self-pay | Admitting: Primary Care

## 2019-07-15 ENCOUNTER — Encounter: Payer: Self-pay | Admitting: Primary Care

## 2019-07-15 VITALS — BP 126/70 | HR 91 | Temp 96.9°F | Ht 64.0 in | Wt 270.5 lb

## 2019-07-15 DIAGNOSIS — R112 Nausea with vomiting, unspecified: Secondary | ICD-10-CM | POA: Diagnosis not present

## 2019-07-15 HISTORY — DX: Nausea with vomiting, unspecified: R11.2

## 2019-07-15 LAB — CBC WITH DIFFERENTIAL/PLATELET
Basophils Absolute: 0.1 10*3/uL (ref 0.0–0.1)
Basophils Relative: 0.7 % (ref 0.0–3.0)
Eosinophils Absolute: 0.1 10*3/uL (ref 0.0–0.7)
Eosinophils Relative: 1.3 % (ref 0.0–5.0)
HCT: 45.6 % (ref 36.0–46.0)
Hemoglobin: 15.4 g/dL — ABNORMAL HIGH (ref 12.0–15.0)
Lymphocytes Relative: 12.4 % (ref 12.0–46.0)
Lymphs Abs: 1.4 10*3/uL (ref 0.7–4.0)
MCHC: 33.7 g/dL (ref 30.0–36.0)
MCV: 88.1 fl (ref 78.0–100.0)
Monocytes Absolute: 0.7 10*3/uL (ref 0.1–1.0)
Monocytes Relative: 6.4 % (ref 3.0–12.0)
Neutro Abs: 9.1 10*3/uL — ABNORMAL HIGH (ref 1.4–7.7)
Neutrophils Relative %: 79.2 % — ABNORMAL HIGH (ref 43.0–77.0)
Platelets: 187 10*3/uL (ref 150.0–400.0)
RBC: 5.18 Mil/uL — ABNORMAL HIGH (ref 3.87–5.11)
RDW: 16.3 % — ABNORMAL HIGH (ref 11.5–15.5)
WBC: 11.5 10*3/uL — ABNORMAL HIGH (ref 4.0–10.5)

## 2019-07-15 LAB — BASIC METABOLIC PANEL
BUN: 65 mg/dL — ABNORMAL HIGH (ref 6–23)
CO2: 39 mEq/L — ABNORMAL HIGH (ref 19–32)
Calcium: 10.1 mg/dL (ref 8.4–10.5)
Chloride: 77 mEq/L — ABNORMAL LOW (ref 96–112)
Creatinine, Ser: 1.66 mg/dL — ABNORMAL HIGH (ref 0.40–1.20)
GFR: 32.64 mL/min — ABNORMAL LOW (ref >60.00)
Glucose, Bld: 481 mg/dL — ABNORMAL HIGH (ref 70–99)
Potassium: 3.5 mEq/L (ref 3.5–5.1)
Sodium: 128 mEq/L — ABNORMAL LOW (ref 135–145)

## 2019-07-15 NOTE — Patient Instructions (Signed)
Stop by the lab prior to leaving today. I will notify you of your results once received.   Continue to stay hydrated with water and sugar free PowerAde.  Please call the [facility] and notify them that you stopped taking Doxycycline antibiotic.   It was a pleasure to see you today!

## 2019-07-15 NOTE — Telephone Encounter (Signed)
Patient called. She was seen in the office today and had blood work. Was advised to call back later this afternoon to get the results.   C/B # [phone]

## 2019-07-15 NOTE — Assessment & Plan Note (Signed)
Acute and seems likely secondary to Doxycycline based off of symptom initiation. She appears stable today, does not appear sickly. She's eating and drinking without vomiting.  Will have her continue off of Doxycycline, recommended she touch base with the wound center regarding symptoms.  Check CBC with diff, BMP today. Encouraged continued hydration with water and sugar free liquids.

## 2019-07-15 NOTE — Progress Notes (Signed)
Subjective:    Patient ID: [name], female    DOB: 1970/09/09, 49 y.o.   MRN: [id number]  HPI  This visit occurred during the SARS-CoV-2 public health emergency.  Safety protocols were in place, including screening questions prior to the visit, additional usage of staff PPE, and extensive cleaning of exam room while observing appropriate contact time as indicated for disinfecting solutions.   [name] is a 49 year old female with a significant medical history including TIA, hypertension, CHF, PAD, uncontrolled diabetes, COPD, CKD, restless legs, bipolar disorder who presents today with a chief complaint of nausea and vomiting.  Her symptoms began on 07/10/19 several hours after taking Doxycycline antibiotic for a wound on right 3rd digit per wound clinic. She stopped taking the Doxycycline last night given persistent nausea with vomiting, she did have one episode of vomiting this morning. Today she feels nauseated.  She denies diarrhea, abdominal pain, fevers. She's been hydrating with water, Powerade Zero, un-sweet tea. This morning she ate two egg and cheese sandwiches after vomiting and hasn't vomited since.  She's also been initiated on potassium 20 mEq daily per her cardiologist for lower extremity cramping.   Wt Readings from Last 3 Encounters:  07/15/19 270 lb 8 oz (122.7 kg)  06/04/19 283 lb 8 oz (128.6 kg)  03/21/19 280 lb (127 kg)     Review of Systems  Constitutional: Negative for fever.  Respiratory: Negative for shortness of breath.   Cardiovascular: Negative for chest pain.  Gastrointestinal: Positive for nausea and vomiting. Negative for abdominal pain and diarrhea.  Neurological: Negative for dizziness.       Past Medical History:  Diagnosis Date  . Anxiety    takes Prozac daily  . Anxiety   . Aortic valve calcification   . Asthma    Advair and Spirva daily  . Asthma   . Bipolar disorder ([place])   . CAD (coronary artery disease)    a. LHC 11/2013 done  for CP/fluid retention: mild disease in prox LAD, mild-mod disease in mRCA, EF 60% with normal LVEDP. b. Normal nuc 03/2016.  Marland Kitchen CHF (congestive heart failure) ([place])   . Chronic diastolic CHF (congestive heart failure) ([place])   . CKD (chronic kidney disease), stage II   . COPD (chronic obstructive pulmonary disease) (H[facility])    a. nocturnal O2.  Marland Kitchen COPD (chronic obstructive pulmonary disease) ([place])   . Coronary artery disease   . Decreased urine stream   . Diabetes mellitus   . Diabetes mellitus without complication ([place])   . Dyspnea   . Family history of adverse reaction to anesthesia    mom gets nauseated  . GERD (gastroesophageal reflux disease)    takes Pepcid daily  . History of blood clots    left leg 3-69yrs ago  . Hyperlipidemia   . Hypertension   . Hypertriglyceridemia   . Inguinal hernia, left 01/2015  . Muscle spasm   . Open wound of genital labia   . Peripheral neuropathy   . RBBB   . Seizures ([place])   . Sepsis ([place]) 01/19/2018  . Sinus tachycardia    a. persistent since 2009.  Marland Kitchen Smokers' cough ([place])   . Stroke [facility]) 1989   left sided weakness  . TIA (transient ischemic attack)   . Tobacco abuse   . Vulvar abscess 01/23/2018     Social History   Socioeconomic History  . Marital status: Married    Spouse name: Not on file  . Number  of children: 2  . Years of education: Not on file  . Highest education level: Not on file  Occupational History  . Not on file  Tobacco Use  . Smoking status: Current Some Day Smoker    Packs/day: 0.50    Years: 36.00    Pack years: 18.00    Types: Cigarettes    Last attempt to quit: 01/18/2018    Years since quitting: 1.4  . Smokeless tobacco: Never Used  Substance and Sexual Activity  . Alcohol use: No  . Drug use: No  . Sexual activity: Not Currently  Other Topics Concern  . Not on file  Social History Narrative   ** Merged History Encounter **       Lives in [place]   Married but lives with Boyfriend - not legally  separated   Disabled - for BiPolar, Seizure disorder, diabetes   Formerly worked at [employer] Strain:   . Difficulty of Paying Living Expenses: Not on file  Food Insecurity:   . Worried About Charity fundraiser in the Last Year: Not on file  . Ran Out of Food in the Last Year: Not on file  Transportation Needs:   . Lack of Transportation (Medical): Not on file  . Lack of Transportation (Non-Medical): Not on file  Physical Activity: Unknown  . Days of Exercise per Week: Patient refused  . Minutes of Exercise per Session: Patient refused  Stress: No Stress Concern Present  . Feeling of Stress : Only a little  Social Connections: Unknown  . Frequency of Communication with Friends and Family: More than three times a week  . Frequency of Social Gatherings with Friends and Family: Patient refused  . Attends Religious Services: Patient refused  . Active Member of Clubs or Organizations: Patient refused  . Attends Archivist Meetings: Patient refused  . Marital Status: Married  Human resources officer Violence: Unknown  . Fear of Current or Ex-Partner: Patient refused  . Emotionally Abused: Patient refused  . Physically Abused: Patient refused  . Sexually Abused: Patient refused    Past Surgical History:  Procedure Laterality Date  . HERNIA REPAIR    . INCISION AND DRAINAGE ABSCESS N/A 01/26/2018   Procedure: INCISION AND DEBRIDEMENT OF VULVAR NECROTIZING SOFT TISSUE INFECTION;  Surgeon: Greer Pickerel, MD;  Location: [place];  Service: General;  Laterality: N/A;  . INCISION AND DRAINAGE PERIRECTAL ABSCESS N/A 01/22/2018   Procedure: IRRIGATION AND DEBRIDEMENT LABIAL/VULVAR AREA;  Surgeon: Coralie Keens, MD;  Location: [place];  Service: General;  Laterality: N/A;  . INCISION AND DRAINAGE PERIRECTAL ABSCESS N/A 01/29/2018   Procedure: IRRIGATION AND DEBRIDEMENT VULVA;  Surgeon: Excell Seltzer, MD;  Location: [place];   Service: General;  Laterality: N/A;  . INGUINAL HERNIA REPAIR Left 04/08/2015   Procedure: OPEN LEFT INGUINAL HERNIA REPAIR WITH MESH;  Surgeon: Ralene Ok, MD;  Location: [place];  Service: General;  Laterality: Left;  . INSERTION OF MESH Left 04/08/2015   Procedure: INSERTION OF MESH;  Surgeon: Ralene Ok, MD;  Location: [place];  Service: General;  Laterality: Left;  . LAPAROSCOPY     Endometriosis  . LEFT HEART CATHETERIZATION WITH CORONARY ANGIOGRAM N/A 12/05/2013   Procedure: LEFT HEART CATHETERIZATION WITH CORONARY ANGIOGRAM;  Surgeon: Jettie Booze, MD;  Location: [facility] CATH LAB;  Service: Cardiovascular;  Laterality: N/A;  . right kidney drained    . TEE WITHOUT  CARDIOVERSION N/A 11/28/2013   Procedure: TRANSESOPHAGEAL ECHOCARDIOGRAM (TEE);  Surgeon: Thayer Headings, MD;  Location: [facility] ENDOSCOPY;  Service: Cardiovascular;  Laterality: N/A;    Family History  Problem Relation Age of Onset  . Venous thrombosis Brother   . Other Brother        BRAIN TUMOR  . Asthma Father   . Diabetes Father   . Coronary artery disease Mother   . Hypertension Mother   . Diabetes Mother   . Asthma Sister   . Diabetes type II Brother     Allergies  Allergen Reactions  . Adhesive [Tape] Rash and Other (See Comments)    TAKES OFF THE SKIN (CERTAIN MEDICAL TAPES DO THIS!!)  . Metoprolol Shortness Of Breath    Occurrence of shortness of breath after 3 days  . Montelukast Shortness Of Breath  . Morphine Sulfate Anaphylaxis, Shortness Of Breath and Nausea And Vomiting    Swollen Throat - Able to tolerate dilaudid  . Penicillins Anaphylaxis, Hives and Shortness Of Breath    Throat swells Has patient had a PCN reaction causing immediate rash, facial/tongue/throat swelling, SOB or lightheadedness with hypotension: Yes Has patient had a PCN reaction causing severe rash involving mucus membranes or skin necrosis: No Has patient had a PCN reaction that required hospitalization: Yes Has patient  had a PCN reaction occurring within the last 10 years: No If all of the above answers are "NO", then may proceed with Cephalosporin use.   . Prednisone Anaphylaxis  . Diltiazem Swelling  . Gabapentin Swelling    Current Outpatient Medications on File Prior to Visit  Medication Sig Dispense Refill  . acetaminophen (TYLENOL) 500 MG tablet Take 500 mg by mouth every 4 (four) hours as needed for moderate pain.     Marland Kitchen ALPRAZolam (XANAX) 0.5 MG tablet Take 1 tablet (0.5 mg total) by mouth 2 (two) times daily as needed for anxiety. (Patient taking differently: Take 1 mg by mouth daily. )  0  . aspirin EC 81 MG tablet Take 81 mg by mouth every morning.    . budesonide (PULMICORT) 0.5 MG/2ML nebulizer solution Take 0.5 mg by nebulization 2 (two) times daily.    . budesonide-formoterol (SYMBICORT) 80-4.5 MCG/ACT inhaler INHALE 2 PUFFS BY MOUTH EVERY 12 HOURS TO PREVENT COUGH OR WHEEZING *RINSE MOUTH AFTER EACH USE* 10.2 g 6  . busPIRone (BUSPAR) 30 MG tablet Take 30 mg by mouth 2 (two) times daily.    . famotidine (PEPCID) 20 MG tablet TAKE 1 TABLET (20 MG TOTAL) BY MOUTH 2 (TWO) TIMES DAILY. FOR HEARTBURN. 180 tablet 1  . FLUoxetine (PROZAC) 40 MG capsule Take 40 mg by mouth at bedtime.     Marland Kitchen glucose blood (ONETOUCH VERIO) test strip Use to monitor glucose levels three times daily; E11.9; E10.42 100 each 12  . Insulin Pen Needle 32G X 4 MM MISC Used to give insulin injections twice daily. 100 each 11  . insulin regular human CONCENTRATED (HUMULIN R U-500 KWIKPEN) 500 UNIT/ML kwikpen Inject 250 Units into the skin 2 (two) times a day. (Patient taking differently: Inject 180-230 Units into the skin 2 (two) times a day. Sliding scale) 14 pen 11  . ipratropium-albuterol (DUONEB) 0.5-2.5 (3) MG/3ML SOLN Take 3 mLs by nebulization every 4 (four) hours as needed (wheezing/shortness of breath). 360 mL 0  . loperamide (IMODIUM A-D) 2 MG tablet Take 4 mg by mouth 3 (three) times daily as needed for diarrhea or  loose stools.    Marland Kitchen  metolazone (ZAROXOLYN) 2.5 MG tablet Take 2.5 mg by mouth at bedtime.    Glory Rosebush Delica Lancets [id number] MISC 1 each by Does not apply route 3 (three) times daily. Use to monitor glucose levels three times daily; E11.9; E10.42 100 each 11  . OXYGEN Inhale 4 L into the lungs at bedtime.     . potassium chloride 20 MEQ TBCR Take 20 mEq by mouth daily. 60 tablet 2  . pregabalin (LYRICA) 300 MG capsule Take 1 capsule (300 mg total) by mouth 2 (two) times daily. as needed for neuropathy. 180 capsule 0  . PROVENTIL HFA 108 (90 Base) MCG/ACT inhaler INHALE 2 PUFFS BY MOUTH INTO THE LUNGS EVERY 4 HOURS AS NEEDED FOR SHORTNESS OFBREATH 6.7 g 6  . rOPINIRole (REQUIP) 1 MG tablet TAKE 1 TABLET BY MOUTH EVERY NIGHT AT BEDTIME FOR RESTLESS LEGS. 90 tablet 1  . spironolactone (ALDACTONE) 50 MG tablet TAKE 1 TABLET BY MOUTH ONCE A DAY 90 tablet 0  . tobramycin (TOBREX) 0.3 % ophthalmic solution Place 1 drop into both eyes daily.     Marland Kitchen torsemide (DEMADEX) 20 MG tablet TAKE 1 TABLET BY MOUTH 4 TIMES DAILY 360 tablet 3  . traZODone (DESYREL) 100 MG tablet Take 100 mg by mouth daily.     Marland Kitchen ULTICARE MINI PEN NEEDLES 31G X 6 MM MISC USE AS DIRECTED THREE TIMES DAILY 100 each 0  . doxycycline (MONODOX) 50 MG capsule Take 50 mg by mouth 2 (two) times daily.    . potassium chloride SA (KLOR-CON) 20 MEQ tablet Take 20 mEq by mouth daily.     No current facility-administered medications on file prior to visit.    BP 126/70   Pulse 91   Temp (!) 96.9 F (36.1 C) (Temporal)   Ht 5\' 4"  (1.626 m)   Wt 270 lb 8 oz (122.7 kg)   LMP 11/11/2011   SpO2 94%   BMI 46.43 kg/m    Objective:   Physical Exam  Constitutional: She appears well-nourished. She does not appear ill.  Cardiovascular: Normal rate and regular rhythm.  Respiratory: Effort normal and breath sounds normal.  GI: Soft. Bowel sounds are normal. There is no abdominal tenderness.  Skin: Skin is warm and dry.           Assessment  & Plan:

## 2019-07-16 NOTE — Telephone Encounter (Signed)
This has been addressed in result note.

## 2019-07-17 ENCOUNTER — Other Ambulatory Visit: Payer: Self-pay

## 2019-07-17 ENCOUNTER — Encounter: Payer: BC Managed Care – PPO | Attending: Internal Medicine | Admitting: Internal Medicine

## 2019-07-17 ENCOUNTER — Other Ambulatory Visit: Payer: Self-pay | Admitting: Internal Medicine

## 2019-07-17 ENCOUNTER — Telehealth: Payer: Self-pay | Admitting: Primary Care

## 2019-07-17 DIAGNOSIS — J449 Chronic obstructive pulmonary disease, unspecified: Secondary | ICD-10-CM | POA: Insufficient documentation

## 2019-07-17 DIAGNOSIS — E11622 Type 2 diabetes mellitus with other skin ulcer: Secondary | ICD-10-CM | POA: Diagnosis not present

## 2019-07-17 DIAGNOSIS — I110 Hypertensive heart disease with heart failure: Secondary | ICD-10-CM | POA: Insufficient documentation

## 2019-07-17 DIAGNOSIS — Z794 Long term (current) use of insulin: Secondary | ICD-10-CM | POA: Insufficient documentation

## 2019-07-17 DIAGNOSIS — I2510 Atherosclerotic heart disease of native coronary artery without angina pectoris: Secondary | ICD-10-CM | POA: Insufficient documentation

## 2019-07-17 DIAGNOSIS — Z86718 Personal history of other venous thrombosis and embolism: Secondary | ICD-10-CM | POA: Insufficient documentation

## 2019-07-17 DIAGNOSIS — T5491XA Toxic effect of unspecified corrosive substance, accidental (unintentional), initial encounter: Secondary | ICD-10-CM | POA: Diagnosis not present

## 2019-07-17 DIAGNOSIS — Z8673 Personal history of transient ischemic attack (TIA), and cerebral infarction without residual deficits: Secondary | ICD-10-CM | POA: Insufficient documentation

## 2019-07-17 DIAGNOSIS — L97211 Non-pressure chronic ulcer of right calf limited to breakdown of skin: Secondary | ICD-10-CM | POA: Insufficient documentation

## 2019-07-17 DIAGNOSIS — E1142 Type 2 diabetes mellitus with diabetic polyneuropathy: Secondary | ICD-10-CM | POA: Insufficient documentation

## 2019-07-17 DIAGNOSIS — R112 Nausea with vomiting, unspecified: Secondary | ICD-10-CM

## 2019-07-17 DIAGNOSIS — F17200 Nicotine dependence, unspecified, uncomplicated: Secondary | ICD-10-CM | POA: Diagnosis not present

## 2019-07-17 DIAGNOSIS — I5032 Chronic diastolic (congestive) heart failure: Secondary | ICD-10-CM | POA: Diagnosis not present

## 2019-07-17 DIAGNOSIS — G4733 Obstructive sleep apnea (adult) (pediatric): Secondary | ICD-10-CM | POA: Insufficient documentation

## 2019-07-17 DIAGNOSIS — T148XXA Other injury of unspecified body region, initial encounter: Secondary | ICD-10-CM

## 2019-07-17 DIAGNOSIS — I890 Lymphedema, not elsewhere classified: Secondary | ICD-10-CM | POA: Diagnosis not present

## 2019-07-17 DIAGNOSIS — X58XXXD Exposure to other specified factors, subsequent encounter: Secondary | ICD-10-CM | POA: Diagnosis not present

## 2019-07-17 DIAGNOSIS — S61202A Unspecified open wound of right middle finger without damage to nail, initial encounter: Secondary | ICD-10-CM | POA: Diagnosis not present

## 2019-07-17 DIAGNOSIS — E11319 Type 2 diabetes mellitus with unspecified diabetic retinopathy without macular edema: Secondary | ICD-10-CM | POA: Insufficient documentation

## 2019-07-17 DIAGNOSIS — S61401D Unspecified open wound of right hand, subsequent encounter: Secondary | ICD-10-CM | POA: Diagnosis not present

## 2019-07-17 MED ORDER — ONDANSETRON 4 MG PO TBDP: 4.0000 mg | ORAL_TABLET | Freq: Three times a day (TID) | ORAL | 0 refills | Status: DC | PRN

## 2019-07-17 NOTE — Progress Notes (Signed)
SHARREN, SILVERIO ([id number]) Visit Report for 07/17/2019 Debridement Details Patient Name: PAYSEN, HAMILTON Date of Service: 07/17/2019 9:00 AM Medical Record Number: [id number] Patient Account Number: [id number] Date of Birth/Sex: 1970-12-27 (49 y.o. F) Treating RN: Cornell Barman Primary Care Provider: Alma Friendly Other Clinician: Referring Provider: Alma Friendly Treating Provider/Extender: Tito Dine in Treatment: 4 Debridement Performed for Wound #5 Right Hand - 3rd Digit Assessment: Performed By: Physician Ricard Dillon, MD Debridement Type: Debridement Level of Consciousness (Pre- Awake and Alert procedure): Pre-procedure Verification/Time Yes - 09:29 Out Taken: Start Time: 09:29 Pain Control: Lidocaine Total Area Debrided (L x W): 0.4 (cm) x 0.5 (cm) = 0.2 (cm) Tissue and other material Viable, Slough, Subcutaneous, Slough debrided: Level: Skin/Subcutaneous Tissue Debridement Description: Excisional Instrument: Curette Bleeding: Minimum Hemostasis Achieved: Pressure End Time: 09:29 Response to Treatment: Procedure was tolerated well Level of Consciousness Awake and Alert (Post-procedure): Post Debridement Measurements of Total Wound Length: (cm) 0.5 Width: (cm) 0.5 Depth: (cm) 0.2 Volume: (cm) 0.039 Character of Wound/Ulcer Post Debridement: Stable Post Procedure Diagnosis Same as Pre-procedure Electronic Signature(s) Signed: 07/17/2019 4:45:14 PM By: Linton Ham MD Signed: 07/17/2019 4:51:02 PM By: Gretta Cool, BSN, RN, CWS, Kim RN, BSN Entered By: Linton Ham on 07/17/2019 10:20:11 Padmanabhan, [name] ([id number]) -------------------------------------------------------------------------------- HPI Details Patient Name: [name] Date of Service: 07/17/2019 9:00 AM Medical Record Number: [id number] Patient Account Number: [id number] Date of Birth/Sex: [birth year] (49 y.o. F) Treating RN: Cornell Barman Primary Care Provider: Alma Friendly Other  Clinician: Referring Provider: Alma Friendly Treating Provider/Extender: Tito Dine in Treatment: 4 History of Present Illness HPI Description: ADMISSION 06/19/2019 Patient is a 49 year old woman who is accompanied by her husband. She has severe diabetic peripheral neuropathy which is left her minimally ambulatory. She spends most of the time in a wheelchair. She also has a history of chronic lower extremity edema history of left leg DVT. She states that on 2 separate occasions roughly a month ago she fell with lacerations on the bilateral anterior tibial areas. She was seen by primary care on 06/04/2019 diagnosed with cellulitis of the left leg put on Bactrim DS. Also noted to have hand wounds with a question of referral to hand surgery. She was also sent to our clinic at the same time. The patient's husband is doing the dressings. He has been using silver alginate that was supplied by home health. He is done a really good job the area on the left leg is actually healed only a small superficial area on the right Somewhere in the in the last 2 weeks she gives a bizarre history of going to wash nail polish off the fingers of both hands. She apparently did not realize that her son had put Clorox in the vinegar jar that they used to clean the countertop. She ended up burning her hands. The patient has a wound on the PIP of the left third finger, PIP of the right second finger and the lateral part of the right third finger Past medical history includes COPD, obstructive sleep apnea on nocturnal O2, type 2 diabetes with peripheral neuropathy, retinopathy, chronic diastolic heart failure coronary artery disease, severe diabetic neuropathy type 2 diabetes apparently diagnosed at age 46 on insulin, COPD, continued tobacco abuse, left leg deep DVT, hypertension, TIAs, seizure disorders, asthma The patient is actually had arterial studies in February of this year. ABIs bilaterally were  1.02 and TBI's bilaterally at 0.6. Waveforms were triphasic and biphasic. She was not felt to have significant arterial disease. 12/23;  patient readmitted the clinic last week. She has predominantly venous insufficiency wounds on the right medial lower leg. She also had open areas on the fingers of both hands which happened in a very bizarre way. She has almost complete healing on the right medial calf. The area on the left third finger is healed. She still has 2 open wounds on the medial aspect of the PIP of the right second finger and the dorsal aspect of the right PIP of the third finger. Of these 2 wounds the PIP of the third finger wound is painful and deeper 12/30; the areas on her right medial calf are closed. She likely has some degree of chronic venous insufficiency and lymphedema. Also very xerotic skin which I have recommended a moisturizing. I did not bring up the issue of compression stockings The areas we are still looking out are on the PIP of the right third finger and the medial part of the PIP of the right second finger. The second finger area is just about closed. The problem is over the PIP of the right third finger. This does not look infected and she is completing her antibiotics from last week. However this is a deep wound at the base of this there is either tendon or periosteum. She she has some healthy granulation but we are not seeing that close over the bottom part of the wound. We are probably going to need to immobilize the finger somewhat so that she does not continuously pull the tissue apart 07/17/2019; right medial calf remains closed and the right second finger is closed. The sole problem here is an area over the PIP of the third finger. These wounds were initially advertised as burn injuries that happened in a very bizarre way [please see my initial discussion on this]. I gave her doxycycline last week because of some erythema around the wound however she did not  tolerate this very well because of nausea. She stopped this within the last day or 2. we have been using silver collagen Electronic Signature(s) Signed: 07/17/2019 4:45:14 PM By: Linton Ham MD [place], [place] ([id number]) Entered By: Linton Ham on 07/17/2019 10:22:21 SAMAURIA, GLAAB ([id number]) -------------------------------------------------------------------------------- Physical Exam Details Patient Name: [name] Date of Service: 07/17/2019 9:00 AM Medical Record Number: [id number] Patient Account Number: [id number] Date of Birth/Sex: [birth year] (49 y.o. F) Treating RN: Cornell Barman Primary Care Provider: Alma Friendly Other Clinician: Referring Provider: Alma Friendly Treating Provider/Extender: Tito Dine in Treatment: 4 Musculoskeletal There is no tenderness over the PIP joint on the right third finger.. Integumentary (Hair, Skin) Erythema from last week is gone.. Notes Wound exam; the pulses in the right arm are normal that includes the radial and brachial pulses. There is no palpable tenderness over the PIP joint. The wound itself is small and punched out. Last week this included undermining and I think exposed extensor tendon of the finger. There appears to be better granulation this week although I still had to debride adherent slough and some debris. I did not see anything that was worth culturing Electronic Signature(s) Signed: 07/17/2019 4:45:14 PM By: Linton Ham MD Entered By: Linton Ham on 07/17/2019 10:23:30 [name] -------------------------------------------------------------------------------- Physician Orders Details Patient Name: [name] Date of Service: 07/17/2019 9:00 AM Medical Record Number: [id number] Patient Account Number: [id number] Date of Birth/Sex: June 10, 1971 (49 y.o. F) Treating RN: Cornell Barman Primary Care Provider: Alma Friendly Other Clinician: Referring Provider: Alma Friendly Treating Provider/Extender: Tito Dine in Treatment: 4 Verbal /  Phone Orders: No Diagnosis Coding Wound Cleansing Wound #5 Right Hand - 3rd Digit o Clean wound with Normal Saline. o May Shower, gently pat wound dry prior to applying new dressing. Anesthetic (add to Medication List) Wound #5 Right Hand - 3rd Digit o Topical Lidocaine 4% cream applied to wound bed prior to debridement (In Clinic Only). Primary Wound Dressing Wound #5 Right Hand - 3rd Digit o Other: - Endoform moistened with hydrogel Secondary Dressing Wound #5 Right Hand - 3rd Digit o Non-adherent pad - Conform and stretch-net to secure Dressing Change Frequency Wound #5 Right Hand - 3rd Digit o Change Dressing Monday, Wednesday, Friday Follow-up Appointments Wound #5 Right Hand - 3rd Digit o Return Appointment in 1 week. Home Health Wound #5 Right Hand - 3rd Digit o Continue Home Health Visits - [place] Nurse may visit PRN to address patientos wound care needs. o FACE TO FACE ENCOUNTER: MEDICARE and MEDICAID PATIENTS: I certify that this patient is under my care and that I had a face-to-face encounter that meets the physician face-to-face encounter requirements with this patient on this date. The encounter with the patient was in whole or in part for the following MEDICAL CONDITION: (primary reason for [place]) MEDICAL NECESSITY: I certify, that based on my findings, NURSING services are a medically necessary home health service. HOME BOUND STATUS: I certify that my clinical findings support that this patient is homebound (i.e., Due to illness or injury, pt requires aid of supportive devices such as crutches, cane, wheelchairs, walkers, the use of special transportation or the assistance of another person to leave their place of residence. There is a normal inability to leave the home and doing so requires considerable and taxing effort.  Other absences are for medical reasons / religious services and are infrequent or of short duration when for other reasons). o If current dressing causes regression in wound condition, may D/C ordered dressing product/s and apply Normal Saline Moist Dressing daily until next [place] / Other MD appointment. [place] of regression in wound condition at [phone]. MARNISHA, LEMUS ([id number]) o Please direct any NON-WOUND related issues/requests for orders to patient's Primary Care Physician Radiology o X-ray, hand - right 3rd finger Electronic Signature(s) Signed: 07/17/2019 4:45:14 PM By: Linton Ham MD Signed: 07/17/2019 4:51:02 PM By: Gretta Cool, BSN, RN, CWS, Kim RN, BSN Entered By: Gretta Cool, BSN, RN, CWS, Kim on 07/17/2019 16:30:29 TYESHIA, STALLCUP ([id number]) -------------------------------------------------------------------------------- Problem List Details Patient Name: [name] Date of Service: 07/17/2019 9:00 AM Medical Record Number: [id number] Patient Account Number: [id number] Date of Birth/Sex: [birth year] (49 y.o. F) Treating RN: Cornell Barman Primary Care Provider: Alma Friendly Other Clinician: Referring Provider: Alma Friendly Treating Provider/Extender: Tito Dine in Treatment: 4 Active Problems ICD-10 Evaluated Encounter Code Description Active Date Today Diagnosis S61.401D Unspecified open wound of right hand, subsequent encounter 06/19/2019 No Yes E11.622 Type 2 diabetes mellitus with other skin ulcer 06/19/2019 No Yes Inactive Problems ICD-10 Code Description Active Date Inactive Date L97.211 Non-pressure chronic ulcer of right calf limited to breakdown of skin 06/19/2019 06/19/2019 L97.821 Non-pressure chronic ulcer of other part of left lower leg limited to 06/19/2019 06/19/2019 breakdown of skin I89.0 Lymphedema, not elsewhere classified 06/19/2019 06/19/2019 S61.402D Unspecified open wound of left hand, subsequent  encounter 06/19/2019 06/19/2019 Resolved Problems Electronic Signature(s) Signed: 07/17/2019 4:45:14 PM By: Linton Ham MD Entered By: Linton Ham on 07/17/2019 10:19:45 Chrostowski, [name] ([id number]) -------------------------------------------------------------------------------- Progress Note Details Patient Name: [name] Date  of Service: 07/17/2019 9:00 AM Medical Record Number: [id number] Patient Account Number: [id number] Date of Birth/Sex: 17-Nov-1970 (49 y.o. F) Treating RN: Cornell Barman Primary Care Provider: Alma Friendly Other Clinician: Referring Provider: Alma Friendly Treating Provider/Extender: Tito Dine in Treatment: 4 Subjective History of Present Illness (HPI) ADMISSION 06/19/2019 Patient is a 49 year old woman who is accompanied by her husband. She has severe diabetic peripheral neuropathy which is left her minimally ambulatory. She spends most of the time in a wheelchair. She also has a history of chronic lower extremity edema history of left leg DVT. She states that on 2 separate occasions roughly a month ago she fell with lacerations on the bilateral anterior tibial areas. She was seen by primary care on 06/04/2019 diagnosed with cellulitis of the left leg put on Bactrim DS. Also noted to have hand wounds with a question of referral to hand surgery. She was also sent to our clinic at the same time. The patient's husband is doing the dressings. He has been using silver alginate that was supplied by home health. He is done a really good job the area on the left leg is actually healed only a small superficial area on the right Somewhere in the in the last 2 weeks she gives a bizarre history of going to wash nail polish off the fingers of both hands. She apparently did not realize that her son had put Clorox in the vinegar jar that they used to clean the countertop. She ended up burning her hands. The patient has a wound on the PIP of the left third  finger, PIP of the right second finger and the lateral part of the right third finger Past medical history includes COPD, obstructive sleep apnea on nocturnal O2, type 2 diabetes with peripheral neuropathy, retinopathy, chronic diastolic heart failure coronary artery disease, severe diabetic neuropathy type 2 diabetes apparently diagnosed at age 57 on insulin, COPD, continued tobacco abuse, left leg deep DVT, hypertension, TIAs, seizure disorders, asthma The patient is actually had arterial studies in February of this year. ABIs bilaterally were 1.02 and TBI's bilaterally at 0.6. Waveforms were triphasic and biphasic. She was not felt to have significant arterial disease. 12/23; patient readmitted the clinic last week. She has predominantly venous insufficiency wounds on the right medial lower leg. She also had open areas on the fingers of both hands which happened in a very bizarre way. She has almost complete healing on the right medial calf. The area on the left third finger is healed. She still has 2 open wounds on the medial aspect of the PIP of the right second finger and the dorsal aspect of the right PIP of the third finger. Of these 2 wounds the PIP of the third finger wound is painful and deeper 12/30; the areas on her right medial calf are closed. She likely has some degree of chronic venous insufficiency and lymphedema. Also very xerotic skin which I have recommended a moisturizing. I did not bring up the issue of compression stockings The areas we are still looking out are on the PIP of the right third finger and the medial part of the PIP of the right second finger. The second finger area is just about closed. The problem is over the PIP of the right third finger. This does not look infected and she is completing her antibiotics from last week. However this is a deep wound at the base of this there is either tendon or periosteum. She she has some healthy  granulation but we are not  seeing that close over the bottom part of the wound. We are probably going to need to immobilize the finger somewhat so that she does not continuously pull the tissue apart 07/17/2019; right medial calf remains closed and the right second finger is closed. The sole problem here is an area over the PIP of the third finger. These wounds were initially advertised as burn injuries that happened in a very bizarre way [please see my initial discussion on this]. I gave her doxycycline last week because of some erythema around the wound however she did not tolerate this very well because of nausea. She stopped this within the last day or 2. we have been using silver collagen Szczesny, Idabelle ([id number]) Objective Constitutional Vitals Time Taken: 9:12 AM, Height: 64 in, Weight: 286 lbs, BMI: 49.1, Temperature: 98.2 F, Pulse: 86 bpm, Respiratory Rate: 18 breaths/min, Blood Pressure: 140/62 mmHg. Musculoskeletal There is no tenderness over the PIP joint on the right third finger.. General Notes: Wound exam; the pulses in the right arm are normal that includes the radial and brachial pulses. There is no palpable tenderness over the PIP joint. The wound itself is small and punched out. Last week this included undermining and I think exposed extensor tendon of the finger. There appears to be better granulation this week although I still had to debride adherent slough and some debris. I did not see anything that was worth culturing Integumentary (Hair, Skin) Erythema from last week is gone.. Wound #4 status is Healed - Epithelialized. Original cause of wound was Trauma. The wound is located on the Right Hand - 2nd Digit. The wound measures 0cm length x 0cm width x 0cm depth; 0cm^2 area and 0cm^3 volume. There is Fat Layer (Subcutaneous Tissue) Exposed exposed. There is no tunneling or undermining noted. There is a medium amount of sanguinous drainage noted. The wound margin is thickened. There is no  granulation within the wound bed. There is a large (67-100%) amount of necrotic tissue within the wound bed including Adherent Slough. Wound #5 status is Open. Original cause of wound was Trauma. The wound is located on the Right Hand - 3rd Digit. The wound measures 0.4cm length x 0.5cm width x 0.2cm depth; 0.157cm^2 area and 0.031cm^3 volume. There is Fat Layer (Subcutaneous Tissue) Exposed exposed. There is no tunneling noted, however, there is undermining starting at 7:00 and ending at 11:00 with a maximum distance of 0.1cm. There is a medium amount of sanguinous drainage noted. The wound margin is thickened. There is no granulation within the wound bed. There is a large (67-100%) amount of necrotic tissue within the wound bed including Adherent Slough. Assessment Active Problems ICD-10 Unspecified open wound of right hand, subsequent encounter Type 2 diabetes mellitus with other skin ulcer Procedures Wound #5 Pre-procedure diagnosis of Wound #5 is a Trauma, Other located on the Right Hand - 3rd Digit . There was a Excisional Mcmeekin, Alessia ([id number]) Skin/Subcutaneous Tissue Debridement with a total area of 0.2 sq cm performed by Ricard Dillon, MD. With the following instrument(s): Curette to remove Viable tissue/material. Material removed includes Subcutaneous Tissue and Slough and after achieving pain control using Lidocaine. No specimens were taken. A time out was conducted at 09:29, prior to the start of the procedure. A Minimum amount of bleeding was controlled with Pressure. The procedure was tolerated well. Post Debridement Measurements: 0.5cm length x 0.5cm width x 0.2cm depth; 0.039cm^3 volume. Character of Wound/Ulcer Post Debridement is stable. Post procedure  Diagnosis Wound #5: Same as Pre-Procedure Plan Wound Cleansing: Wound #5 Right Hand - 3rd Digit: Clean wound with Normal Saline. May Shower, gently pat wound dry prior to applying new dressing. Anesthetic (add to  Medication List): Wound #5 Right Hand - 3rd Digit: Topical Lidocaine 4% cream applied to wound bed prior to debridement (In Clinic Only). Primary Wound Dressing: Wound #5 Right Hand - 3rd Digit: Other: - Endoform moistened with hydrogel Secondary Dressing: Wound #5 Right Hand - 3rd Digit: Non-adherent pad - Conform and stretch-net to secure Dressing Change Frequency: Wound #5 Right Hand - 3rd Digit: Change Dressing Monday, Wednesday, Friday Follow-up Appointments: Wound #5 Right Hand - 3rd Digit: Return Appointment in 1 week. Home Health: Wound #5 Right Hand - 3rd Digit: [place] Nurse may visit PRN to address patient s wound care needs. FACE TO FACE ENCOUNTER: MEDICARE and MEDICAID PATIENTS: I certify that this patient is under my care and that I had a face-to-face encounter that meets the physician face-to-face encounter requirements with this patient on this date. The encounter with the patient was in whole or in part for the following MEDICAL CONDITION: (primary reason for [place]) MEDICAL NECESSITY: I certify, that based on my findings, NURSING services are a medically necessary home health service. HOME BOUND STATUS: I certify that my clinical findings support that this patient is homebound (i.e., Due to illness or injury, pt requires aid of supportive devices such as crutches, cane, wheelchairs, walkers, the use of special transportation or the assistance of another person to leave their place of residence. There is a normal inability to leave the home and doing so requires considerable and taxing effort. Other absences are for medical reasons / religious services and are infrequent or of short duration when for other reasons). If current dressing causes regression in wound condition, may D/C ordered dressing product/s and apply Normal Saline Moist Dressing daily until next [place] / Other MD appointment.  [place] of regression in wound condition at [phone]. Please direct any NON-WOUND related issues/requests for orders to patient's Primary Care Physician 1 I change the primary dressing to endoform. The optimistic thing today is that there appears to be better looking granulation and less visible tendon although the wound is still requiring debridement. 2. I see no current evidence of infection there is no erythema no tenderness of the joint therefore I do not think any antibiotics are necessary Steuck, Karri ([id number]) 3. The patient tells me that she had nausea vomiting and diarrhea and went to her primary doctor and have lab work showing a white count of 15. Actually I looked into this her white count was 11.5 which was down from previous 2 weeks ago. Her abdominal examination is benign. I do not think there is any evidence of anything serious here such as pseudomembranous colitis Electronic Signature(s) Signed: 07/17/2019 4:45:14 PM By: Linton Ham MD Entered By: Linton Ham on 07/17/2019 10:40:37 [place], [name] ([id number]) -------------------------------------------------------------------------------- [place] Details Patient Name: [name] Date of Service: 07/17/2019 Medical Record Number: [id number] Patient Account Number: [id number] Date of Birth/Sex: Nov 08, 1970 (49 y.o. F) Treating RN: Cornell Barman Primary Care Provider: Alma Friendly Other Clinician: Referring Provider: Alma Friendly Treating Provider/Extender: Tito Dine in Treatment: 4 Diagnosis Coding ICD-10 Codes Code Description S61.401D Unspecified open wound of right hand, subsequent encounter E11.622 Type 2 diabetes mellitus with other skin ulcer Facility Procedures CPT4 Code: [id number] Description: [id number] - DEB SUBQ TISSUE  20 SQ CM/< ICD-10 Diagnosis Description S61.401D Unspecified open wound of right hand, subsequent encou E11.622 Type 2 diabetes mellitus with  other skin ulcer Modifier: nter Quantity: 1 Physician Procedures CPT4 Code: [id number] Description: [id number] - WC PHYS SUBQ TISS 20 SQ CM ICD-10 Diagnosis Description S61.401D Unspecified open wound of right hand, subsequent encou E11.622 Type 2 diabetes mellitus with other skin ulcer Modifier: nter Quantity: 1 Electronic Signature(s) Signed: 07/17/2019 4:45:14 PM By: Linton Ham MD Entered By: Linton Ham on 07/17/2019 10:40:53

## 2019-07-17 NOTE — Telephone Encounter (Signed)
We need to get in touch with the wound care clinic and find out what's going on. Please notify them that the patient couldn't tolerate the Doxycycline antibiotic that THEY prescribed for her finger. Are they recommending further treatment or not? They evaluated her today.

## 2019-07-17 NOTE — Progress Notes (Signed)
KAMARA, PRIMO ([id number]) Visit Report for 07/17/2019 Arrival Information Details Patient Name: [name], [name] Date of Service: 07/17/2019 9:00 AM Medical Record Number: [id number] Patient Account Number: [id number] Date of Birth/Sex: [birth year] (49 y.o. F) Treating RN: Harold Barban Primary Care Daleyssa Loiselle: Alma Friendly Other Clinician: Referring Hoda Hon: Alma Friendly Treating Emery Dupuy/Extender: Tito Dine in Treatment: 4 Visit Information History Since Last Visit Added or deleted any medications: No Patient Arrived: Wheel Chair Any new allergies or adverse reactions: No Arrival Time: 09:10 Had a fall or experienced change in No Accompanied By: husband activities of daily living that may affect Transfer Assistance: None risk of falls: Patient Identification Verified: Yes Signs or symptoms of abuse/neglect since last visito No Secondary Verification Process Yes Hospitalized since last visit: No Completed: Has Dressing in Place as Prescribed: Yes Patient Has Alerts: Yes Pain Present Now: Yes Patient Alerts: ABI L/R 1.02 TBI L/R 0.6 Electronic Signature(s) Signed: 07/17/2019 3:59:27 PM By: Harold Barban Entered By: Harold Barban on 07/17/2019 09:11:48 [place], [name] ([id number]) -------------------------------------------------------------------------------- Encounter Discharge Information Details Patient Name: [name] Date of Service: 07/17/2019 9:00 AM Medical Record Number: [id number] Patient Account Number: [id number] Date of Birth/Sex: [birth year] (49 y.o. F) Treating RN: Cornell Barman Primary Care Noreene Boreman: Alma Friendly Other Clinician: Referring Norton Bivins: Alma Friendly Treating Drequan Ironside/Extender: Tito Dine in Treatment: 4 Encounter Discharge Information Items Post Procedure Vitals Discharge Condition: Stable Temperature (F): 98.2 Ambulatory Status: Wheelchair Pulse (bpm): 86 Discharge Destination: Home Respiratory Rate  (breaths/min): 18 Transportation: Private Auto Blood Pressure (mmHg): 140/62 Accompanied By: self Schedule Follow-up Appointment: Yes Clinical Summary of Care: Electronic Signature(s) Signed: 07/17/2019 4:51:02 PM By: Gretta Cool, BSN, RN, CWS, Kim RN, BSN Entered By: Gretta Cool, BSN, RN, CWS, Kim on 07/17/2019 09:32:43 [name] ([id number]) -------------------------------------------------------------------------------- Lower Extremity Assessment Details Patient Name: [name] Date of Service: 07/17/2019 9:00 AM Medical Record Number: [id number] Patient Account Number: [id number] Date of Birth/Sex: 1971-05-10 (49 y.o. F) Treating RN: Harold Barban Primary Care Django Nguyen: Alma Friendly Other Clinician: Referring Ajanay Farve: Alma Friendly Treating Kailly Richoux/Extender: Tito Dine in Treatment: 4 Electronic Signature(s) Signed: 07/17/2019 3:59:27 PM By: Harold Barban Entered By: Harold Barban on 07/17/2019 09:22:20 [name], [name] ([id number]) -------------------------------------------------------------------------------- Multi Wound Chart Details Patient Name: [name] Date of Service: 07/17/2019 9:00 AM Medical Record Number: [id number] Patient Account Number: [id number] Date of Birth/Sex: Apr 10, 1971 (49 y.o. F) Treating RN: Cornell Barman Primary Care Jahdai Padovano: Alma Friendly Other Clinician: Referring Brandon Scarbrough: Alma Friendly Treating Ama Mcmaster/Extender: Tito Dine in Treatment: 4 Vital Signs Height(in): 64 Pulse(bpm): 33 Weight(lbs): 286 Blood Pressure(mmHg): 140/62 Body Mass Index(BMI): 49 Temperature(F): 98.2 Respiratory Rate 18 (breaths/min): Photos: [N/A:N/A] Wound Location: Right Hand - 2nd Digit Right Hand - 3rd Digit N/A Wounding Event: Trauma Trauma N/A Primary Etiology: Trauma, Other Trauma, Other N/A Comorbid History: Asthma, Chronic Obstructive Asthma, Chronic Obstructive N/A Pulmonary Disease (COPD), Pulmonary Disease  (COPD), Sleep Apnea, Arrhythmia, Sleep Apnea, Arrhythmia, Congestive Heart Failure, Congestive Heart Failure, Coronary Artery Disease, Coronary Artery Disease, Hypertension, Type II Diabetes Hypertension, Type II Diabetes Date Acquired: 05/20/2019 05/20/2019 N/A Weeks of Treatment: 4 4 N/A Wound Status: Healed - Epithelialized Open N/A Measurements L x W x D 0x0x0 0.4x0.5x0.2 N/A (cm) Area (cm) : 0 0.157 N/A Volume (cm) : 0 0.031 N/A % Reduction in Area: 100.00% 72.20% N/A % Reduction in Volume: 100.00% 72.60% N/A Starting Position 1 7 (o'clock): Ending Position 1 11 (o'clock): Maximum Distance 1 (cm): 0.1 Undermining: No Yes N/A Classification: Full Thickness Without Full Thickness Without N/A Exposed Support  Structures Exposed Support Structures Exudate Amount: Medium Medium N/A Exudate Type: Sanguinous Sanguinous N/A Exudate Color: red red N/A [name], Nancy ([id number]) Wound Margin: Thickened Thickened N/A Granulation Amount: None Present (0%) None Present (0%) N/A Necrotic Amount: Large (67-100%) Large (67-100%) N/A Exposed Structures: Fat Layer (Subcutaneous Fat Layer (Subcutaneous N/A Tissue) Exposed: Yes Tissue) Exposed: Yes Fascia: No Fascia: No Tendon: No Tendon: No Muscle: No Muscle: No Joint: No Joint: No Bone: No Bone: No Epithelialization: None None N/A Debridement: N/A Debridement - Excisional N/A Pre-procedure N/A 09:29 N/A Verification/Time Out Taken: Pain Control: N/A Lidocaine N/A Tissue Debrided: N/A Subcutaneous, Slough N/A Level: N/A Skin/Subcutaneous Tissue N/A Debridement Area (sq cm): N/A 0.2 N/A Instrument: N/A Curette N/A Bleeding: N/A Minimum N/A Hemostasis Achieved: N/A Pressure N/A Debridement Treatment N/A Procedure was tolerated well N/A Response: Post Debridement N/A 0.5x0.5x0.2 N/A Measurements L x W x D (cm) Post Debridement Volume: N/A 0.039 N/A (cm) Procedures Performed: N/A Debridement N/A Treatment Notes Wound #5  (Right Hand - 3rd Digit) Notes Endoform, non-adherent pad, conform - Electronic Signature(s) Signed: 07/17/2019 4:45:14 PM By: Linton Ham MD Entered By: Linton Ham on 07/17/2019 10:20:01 [name] -------------------------------------------------------------------------------- Multi-Disciplinary Care Plan Details Patient Name: [name] Date of Service: 07/17/2019 9:00 AM Medical Record Number: [id number] Patient Account Number: [id number] Date of Birth/Sex: 1971-05-21 (49 y.o. F) Treating RN: Cornell Barman Primary Care Glorianna Gott: Alma Friendly Other Clinician: Referring Volney Reierson: Alma Friendly Treating Deryl Giroux/Extender: Tito Dine in Treatment: 4 Active Inactive Abuse / Safety / Falls / Self Care Management Nursing Diagnoses: History of Falls Goals: Patient will not develop complications from immobility Date Initiated: 06/19/2019 Target Resolution Date: 07/10/2019 Goal Status: Active Patient will remain injury free related to falls Date Initiated: 06/19/2019 Target Resolution Date: 07/10/2019 Goal Status: Active Interventions: Assess fall risk on admission and as needed Treatment Activities: Patient referred to home care : 06/19/2019 Notes: Orientation to the Wound Care Program Nursing Diagnoses: Knowledge deficit related to the wound healing center program Goals: Patient/caregiver will verbalize understanding of the [place] Program Date Initiated: 06/19/2019 Target Resolution Date: 07/10/2019 Goal Status: Active Interventions: Provide education on orientation to the wound center Notes: Peripheral Neuropathy Nursing Diagnoses: Knowledge deficit related to disease process and management of peripheral neurovascular dysfunction Potential alteration in peripheral tissue perfusion (select prior to confirmation of diagnosis) [name], [name] Goals: Patient/caregiver will verbalize understanding of disease  process and disease management Date Initiated: 06/19/2019 Target Resolution Date: 07/10/2019 Goal Status: Active Interventions: Assess signs and symptoms of neuropathy upon admission and as needed Treatment Activities: Patient referred for customized footwear/offloading : 06/19/2019 Notes: Wound/Skin Impairment Nursing Diagnoses: Impaired tissue integrity Goals: Patient/caregiver will verbalize understanding of skin care regimen Date Initiated: 06/19/2019 Target Resolution Date: 07/10/2019 Goal Status: Active Ulcer/skin breakdown will have a volume reduction of 30% by week 4 Date Initiated: 06/19/2019 Target Resolution Date: 07/20/2019 Goal Status: Active Interventions: Assess patient/caregiver ability to obtain necessary supplies Assess ulceration(s) every visit Treatment Activities: Skin care regimen initiated : 06/19/2019 Topical wound management initiated : 06/19/2019 Notes: Electronic Signature(s) Signed: 07/17/2019 4:51:02 PM By: Gretta Cool, BSN, RN, CWS, Kim RN, BSN Entered By: Gretta Cool, BSN, RN, CWS, Kim on 07/17/2019 09:27:20 [name], [name] -------------------------------------------------------------------------------- Pain Assessment Details Patient Name: [name] Date of Service: 07/17/2019 9:00 AM Medical Record Number: [id number] Patient Account Number: [id number] Date of Birth/Sex: 04/23/1971 (49 y.o. F) Treating RN: Harold Barban Primary Care Eh Sauseda: Alma Friendly Other Clinician: Referring Geovanie Winnett: Alma Friendly Treating Mckenzie Toruno/Extender: Tito Dine  in Treatment: 4 Active Problems Location of Pain Severity and Description of Pain Patient Has Paino Yes Site Locations Rate the pain. Current Pain Level: 8 Pain Management and Medication Current Pain Management: Electronic Signature(s) Signed: 07/17/2019 3:59:27 PM By: Harold Barban Entered By: Harold Barban on 07/17/2019 09:12:07 [name] -------------------------------------------------------------------------------- Patient/Caregiver Education Details Patient Name: [name] Date of Service: 07/17/2019 9:00 AM Medical Record Number: [id number] Patient Account Number: [id number] Date of Birth/Gender: [birth year] (49 y.o. F) Treating RN: Cornell Barman Primary Care Physician: Alma Friendly Other Clinician: Referring Physician: Alma Friendly Treating Physician/Extender: Tito Dine in Treatment: 4 Education Assessment Education Provided To: Patient Education Topics Provided Wound/Skin Impairment: Handouts: Caring for Your Ulcer Methods: Demonstration, Explain/Verbal Responses: State content correctly Electronic Signature(s) Signed: 07/17/2019 4:51:02 PM By: Gretta Cool, BSN, RN, CWS, Kim RN, BSN Entered By: Gretta Cool, BSN, RN, CWS, Kim on 07/17/2019 16:32:11 [name] -------------------------------------------------------------------------------- Wound Assessment Details Patient Name: [name] Date of Service: 07/17/2019 9:00 AM Medical Record Number: [id number] Patient Account Number: [id number] Date of Birth/Sex: 1970-12-06 (49 y.o. F) Treating RN: Cornell Barman Primary Care Tameia Rafferty: Alma Friendly Other Clinician: Referring Varetta Chavers: Alma Friendly Treating Aliviya Schoeller/Extender: Tito Dine in Treatment: 4 Wound Status Wound Number: 4 Primary Trauma, Other Etiology: Wound Location: Right Hand - 2nd Digit Wound Healed - Epithelialized Wounding Event: Trauma Status: Date Acquired: 05/20/2019 Comorbid Asthma, Chronic Obstructive Pulmonary Disease Weeks Of Treatment: 4 History: (COPD), Sleep Apnea, Arrhythmia, Congestive Clustered Wound: No Heart Failure, Coronary Artery Disease, Hypertension, Type II Diabetes Photos Wound Measurements Length: (cm) 0 % Redu Width: (cm) 0 % Redu Depth: (cm) 0 Epithe Area: (cm) 0 Tunne Volume: (cm) 0 Under ction in Area:  100% ction in Volume: 100% lialization: None ling: No mining: No Wound Description Full Thickness Without Exposed Support Classification: Structures Wound Margin: Thickened Exudate Medium Amount: Exudate Type: Sanguinous Exudate Color: red Foul Odor After Cleansing: No Slough/Fibrino Yes Wound Bed Granulation Amount: None Present (0%) Exposed Structure Necrotic Amount: Large (67-100%) Fascia Exposed: No Necrotic Quality: Adherent Slough Fat Layer (Subcutaneous Tissue) Exposed: Yes Tendon Exposed: No Muscle Exposed: No Joint Exposed: No Bone Exposed: No [place], Libra ([id number]) Electronic Signature(s) Signed: 07/17/2019 4:51:02 PM By: Gretta Cool, BSN, RN, CWS, Kim RN, BSN Entered By: Gretta Cool, BSN, RN, CWS, Kim on 07/17/2019 09:27:00 [name], [name] -------------------------------------------------------------------------------- Wound Assessment Details Patient Name: [name] Date of Service: 07/17/2019 9:00 AM Medical Record Number: [id number] Patient Account Number: [id number] Date of Birth/Sex: 1970-11-10 (49 y.o. F) Treating RN: Harold Barban Primary Care Galilee Pierron: Alma Friendly Other Clinician: Referring Laronica Bhagat: Alma Friendly Treating Bernadene Garside/Extender: Tito Dine in Treatment: 4 Wound Status Wound Number: 5 Primary Trauma, Other Etiology: Wound Location: Right Hand - 3rd Digit Wound Open Wounding Event: Trauma Status: Date Acquired: 05/20/2019 Comorbid Asthma, Chronic Obstructive Pulmonary Disease Weeks Of Treatment: 4 History: (COPD), Sleep Apnea, Arrhythmia, Congestive Clustered Wound: No Heart Failure, Coronary Artery Disease, Hypertension, Type II Diabetes Photos Wound Measurements Length: (cm) 0.4 Width: (cm) 0.5 Depth: (cm) 0.2 Area: (cm) 0.157 Volume: (cm) 0.031 % Reduction in Area: 72.2% % Reduction in Volume: 72.6% Epithelialization: None Tunneling: No Undermining: Yes Starting Position (o'clock):  7 Ending Position (o'clock): 11 Maximum Distance: (cm) 0.1 Wound Description Full Thickness Without Exposed Support Classification: Structures Wound Margin: Thickened Exudate Medium Amount: Exudate Type: Sanguinous Exudate Color: red Foul Odor After Cleansing: No Slough/Fibrino Yes Wound Bed Granulation Amount: None Present (0%) Exposed Structure Necrotic Amount: Large (67-100%) Fascia Exposed: No Necrotic Quality: Adherent  Slough Fat Layer (Subcutaneous Tissue) Exposed: Yes Tanori, Nani ([id number]) Tendon Exposed: No Muscle Exposed: No Joint Exposed: No Bone Exposed: No Treatment Notes Wound #5 (Right Hand - 3rd Digit) Notes Endoform, non-adherent pad, conform - Electronic Signature(s) Signed: 07/17/2019 3:59:27 PM By: Harold Barban Entered By: Harold Barban on 07/17/2019 09:21:24 Guill[place], [name] -------------------------------------------------------------------------------- [place] Details Patient Name: [name] Date of Service: 07/17/2019 9:00 AM Medical Record Number: [id number] Patient Account Number: [id number] Date of Birth/Sex: June 08, 1971 (50 y.o. F) Treating RN: Harold Barban Primary Care Orvie Caradine: Alma Friendly Other Clinician: Referring Nikky Duba: Alma Friendly Treating Glenis Musolf/Extender: Tito Dine in Treatment: 4 Vital Signs Time Taken: 09:12 Temperature (F): 98.2 Height (in): 64 Pulse (bpm): 86 Weight (lbs): 286 Respiratory Rate (breaths/min): 18 Body Mass Index (BMI): 49.1 Blood Pressure (mmHg): 140/62 Reference Range: 80 - 120 mg / dl Electronic Signature(s) Signed: 07/17/2019 3:59:27 PM By: Harold Barban Entered By: Harold Barban on 07/17/2019 09:14:17

## 2019-07-17 NOTE — Telephone Encounter (Signed)
Please have her come in for repeat labs tomorrow. I'm going to send some Zofran to her pharmacy to use as needed for nausea.  Have her work on hydration with water.  If symptoms get worse then please have her go to the hospital.

## 2019-07-17 NOTE — Telephone Encounter (Signed)
Patient called today requesting a call back  She stated that she went to see the surgeon. They did not give her an antibiotic or medication for nausea. They advised her to contact our office and speak with her primary care about these medications

## 2019-07-17 NOTE — Progress Notes (Signed)
MARTA, CHUBBUCK ([id number]) Visit Report for 07/03/2019 Debridement Details Patient Name: [name], [name] Date of Service: 07/03/2019 8:45 AM Medical Record Number: [id number] Patient Account Number: [id number] Date of Birth/Sex: 25-Aug-1970 (49 y.o. F) Treating RN: Cornell Barman Primary Care Provider: Alma Friendly Other Clinician: Referring Provider: Alma Friendly Treating Provider/Extender: Tito Dine in Treatment: 2 Debridement Performed for Wound #4 Right Hand - 2nd Digit Assessment: Performed By: Physician Ricard Dillon, MD Debridement Type: Debridement Level of Consciousness (Pre- Awake and Alert procedure): Pre-procedure Verification/Time Yes - 09:05 Out Taken: Start Time: 09:05 Pain Control: Lidocaine Total Area Debrided (L x W): 0.5 (cm) x 0.5 (cm) = 0.25 (cm) Tissue and other material Viable, Non-Viable, Slough, Subcutaneous, Slough debrided: Level: Skin/Subcutaneous Tissue Debridement Description: Excisional Instrument: Curette Bleeding: Minimum Hemostasis Achieved: Pressure End Time: 09:08 Response to Treatment: Procedure was tolerated well Level of Consciousness Awake and Alert (Post-procedure): Post Debridement Measurements of Total Wound Length: (cm) 0.5 Width: (cm) 0.5 Depth: (cm) 0.2 Volume: (cm) 0.039 Character of Wound/Ulcer Post Debridement: Stable Post Procedure Diagnosis Same as Pre-procedure Electronic Signature(s) Signed: 07/03/2019 5:48:31 PM By: Linton Ham MD Signed: 07/17/2019 4:51:46 PM By: Gretta Cool, BSN, RN, CWS, Kim RN, BSN Entered By: Linton Ham on 07/03/2019 09:35:48 Wist, [name] ([id number]) -------------------------------------------------------------------------------- Debridement Details Patient Name: [name] Date of Service: 07/03/2019 8:45 AM Medical Record Number: [id number] Patient Account Number: [id number] Date of Birth/Sex: 1970/08/28 (49 y.o. F) Treating RN: Cornell Barman Primary Care  Provider: Alma Friendly Other Clinician: Referring Provider: Alma Friendly Treating Provider/Extender: Tito Dine in Treatment: 2 Debridement Performed for Wound #5 Right Hand - 3rd Digit Assessment: Performed By: Physician Ricard Dillon, MD Debridement Type: Debridement Level of Consciousness (Pre- Awake and Alert procedure): Pre-procedure Verification/Time Yes - 09:05 Out Taken: Start Time: 09:05 Pain Control: Lidocaine Total Area Debrided (L x W): 0.9 (cm) x 1 (cm) = 0.9 (cm) Tissue and other material Viable, Non-Viable, Slough, Subcutaneous, Slough debrided: Level: Skin/Subcutaneous Tissue Debridement Description: Excisional Instrument: Curette Bleeding: Minimum Hemostasis Achieved: Pressure End Time: 09:08 Response to Treatment: Procedure was tolerated well Level of Consciousness Awake and Alert (Post-procedure): Post Debridement Measurements of Total Wound Length: (cm) 0.9 Width: (cm) 1 Depth: (cm) 0.2 Volume: (cm) 0.141 Character of Wound/Ulcer Post Debridement: Stable Post Procedure Diagnosis Same as Pre-procedure Electronic Signature(s) Signed: 07/03/2019 5:48:31 PM By: Linton Ham MD Signed: 07/17/2019 4:51:46 PM By: Gretta Cool, BSN, RN, CWS, Kim RN, BSN Entered By: Linton Ham on 07/03/2019 09:35:59 [name], [name] ([id number]) -------------------------------------------------------------------------------- HPI Details Patient Name: [name] Date of Service: 07/03/2019 8:45 AM Medical Record Number: [id number] Patient Account Number: [id number] Date of Birth/Sex: 1970-07-29 (49 y.o. F) Treating RN: Cornell Barman Primary Care Provider: Alma Friendly Other Clinician: Referring Provider: Alma Friendly Treating Provider/Extender: Tito Dine in Treatment: 2 History of Present Illness HPI Description: ADMISSION 06/19/2019 Patient is a 49 year old woman who is accompanied by her husband. She has severe diabetic  peripheral neuropathy which is left her minimally ambulatory. She spends most of the time in a wheelchair. She also has a history of chronic lower extremity edema history of left leg DVT. She states that on 2 separate occasions roughly a month ago she fell with lacerations on the bilateral anterior tibial areas. She was seen by primary care on 06/04/2019 diagnosed with cellulitis of the left leg put on Bactrim DS. Also noted to have hand wounds with a question of referral to hand surgery. She was also sent to our clinic at the same time.  The patient's husband is doing the dressings. He has been using silver alginate that was supplied by home health. He is done a really good job the area on the left leg is actually healed only a small superficial area on the right Somewhere in the in the last 2 weeks she gives a bizarre history of going to wash nail polish off the fingers of both hands. She apparently did not realize that her son had put Clorox in the vinegar jar that they used to clean the countertop. She ended up burning her hands. The patient has a wound on the PIP of the left third finger, PIP of the right second finger and the lateral part of the right third finger Past medical history includes COPD, obstructive sleep apnea on nocturnal O2, type 2 diabetes with peripheral neuropathy, retinopathy, chronic diastolic heart failure coronary artery disease, severe diabetic neuropathy type 2 diabetes apparently diagnosed at age 56 on insulin, COPD, continued tobacco abuse, left leg deep DVT, hypertension, TIAs, seizure disorders, asthma The patient is actually had arterial studies in February of this year. ABIs bilaterally were 1.02 and TBI's bilaterally at 0.6. Waveforms were triphasic and biphasic. She was not felt to have significant arterial disease. 12/23; patient readmitted the clinic last week. She has predominantly venous insufficiency wounds on the right medial lower leg. She also had open  areas on the fingers of both hands which happened in a very bizarre way. She has almost complete healing on the right medial calf. The area on the left third finger is healed. She still has 2 open wounds on the medial aspect of the PIP of the left second finger and the dorsal aspect of the PIP of the third finger. Of these 2 wounds the PIP of the third finger wound is painful and deeper Electronic Signature(s) Signed: 07/03/2019 5:48:31 PM By: Linton Ham MD Entered By: Linton Ham on 07/03/2019 09:37:25 Lomas, [name] -------------------------------------------------------------------------------- Physical Exam Details Patient Name: [name] Date of Service: 07/03/2019 8:45 AM Medical Record Number: [id number] Patient Account Number: [id number] Date of Birth/Sex: 14-Jul-1970 (49 y.o. F) Treating RN: Cornell Barman Primary Care Provider: Alma Friendly Other Clinician: Referring Provider: Alma Friendly Treating Provider/Extender: Tito Dine in Treatment: 2 Constitutional Patient is hypertensive.. Pulse regular and within target range for patient.Marland Kitchen Respirations regular, non-labored and within target range.. Temperature is normal and within the target range for the patient.Marland Kitchen appears in no distress. Does not appear to be systemically unwell. Notes Wound exam; the area on the left anterior tibial area is healed. She has a tiny eschared area on the right medial calf which is just about closed. oThe most concerning areas are on the fingers of her right hand. This includes the left lateral second PIP and the third dorsal PIP. Both of these require debridement. The area on the third dorsal PIP is a full-thickness wound I think approaching the joint capsule. There is also tenderness in the PIP joint in this area and some erythema around the wound I think this is enough to justify antibiotics. Electronic Signature(s) Signed: 07/03/2019 5:48:31 PM By: Linton Ham MD Entered By: Linton Ham on 07/03/2019 09:39:33 [name], [name] -------------------------------------------------------------------------------- Physician Orders Details Patient Name: [name] Date of Service: 07/03/2019 8:45 AM Medical Record Number: [id number] Patient Account Number: [id number] Date of Birth/Sex: Jun 25, 1971 (49 y.o. F) Treating RN: Cornell Barman Primary Care Provider: Alma Friendly Other Clinician: Referring Provider: Alma Friendly Treating Provider/Extender: Tito Dine in Treatment: 2 Verbal /  Phone Orders: No Diagnosis Coding Wound Cleansing Wound #2 Right,Medial Lower Leg o Clean wound with Normal Saline. o May Shower, gently pat wound dry prior to applying new dressing. Wound #4 Right Hand - 2nd Digit o Clean wound with Normal Saline. o May Shower, gently pat wound dry prior to applying new dressing. Wound #5 Right Hand - 3rd Digit o Clean wound with Normal Saline. o May Shower, gently pat wound dry prior to applying new dressing. Anesthetic (add to Medication List) Wound #2 Right,Medial Lower Leg o Topical Lidocaine 4% cream applied to wound bed prior to debridement (In Clinic Only). Wound #4 Right Hand - 2nd Digit o Topical Lidocaine 4% cream applied to wound bed prior to debridement (In Clinic Only). Wound #5 Right Hand - 3rd Digit o Topical Lidocaine 4% cream applied to wound bed prior to debridement (In Clinic Only). Primary Wound Dressing Wound #2 Right,Medial Lower Leg o Silver Collagen - moistened with saline Wound #4 Right Hand - 2nd Digit o Silver Collagen - moistened with saline Wound #5 Right Hand - 3rd Digit o Silver Collagen - moistened with saline Secondary Dressing Wound #2 Right,Medial Lower Leg o Other - coverlet Wound #4 Right Hand - 2nd Digit o Non-adherent pad - Conform and stretch-net to secure Wound #5 Right Hand - 3rd Digit o Non-adherent pad -  Conform and stretch-net to secure Dressing Change Frequency Benally, [name] Wound #2 Right,Medial Lower Leg o Change Dressing Monday, Wednesday, Friday Wound #4 Right Hand - 2nd Digit o Change Dressing Monday, Wednesday, Friday Wound #5 Right Hand - 3rd Digit o Change Dressing Monday, Wednesday, Friday Follow-up Appointments Wound #2 Right,Medial Lower Leg o Return Appointment in 1 week. Wound #4 Right Hand - 2nd Digit o Return Appointment in 1 week. Wound #5 Right Hand - 3rd Digit o Return Appointment in 1 week. Edema Control Wound #2 Right,Medial Lower Leg o Elevate legs to the level of the heart and pump ankles as often as possible Home Health Wound #2 Right,Medial Lower Leg o [place] Nurse may visit PRN to address patientos wound care needs. o FACE TO FACE ENCOUNTER: MEDICARE and MEDICAID PATIENTS: I certify that this patient is under my care and that I had a face-to-face encounter that meets the physician face-to-face encounter requirements with this patient on this date. The encounter with the patient was in whole or in part for the following MEDICAL CONDITION: (primary reason for [place]) MEDICAL NECESSITY: I certify, that based on my findings, NURSING services are a medically necessary home health service. HOME BOUND STATUS: I certify that my clinical findings support that this patient is homebound (i.e., Due to illness or injury, pt requires aid of supportive devices such as crutches, cane, wheelchairs, walkers, the use of special transportation or the assistance of another person to leave their place of residence. There is a normal inability to leave the home and doing so requires considerable and taxing effort. Other absences are for medical reasons / religious services and are infrequent or of short duration when for other reasons). o If current dressing causes regression in  wound condition, may D/C ordered dressing product/s and apply Normal Saline Moist Dressing daily until next [place] / Other MD appointment. [place] of regression in wound condition at [phone]. o Please direct any NON-WOUND related issues/requests for orders to patient's Primary Care Physician Wound #4 Right Hand - 2nd Digit o [place]  Visits - [facility] Nurse may visit PRN to address patientos wound care needs. o FACE TO FACE ENCOUNTER: MEDICARE and MEDICAID PATIENTS: I certify that this patient is under my care and that I had a face-to-face encounter that meets the physician face-to-face encounter requirements with this patient on this date. The encounter with the patient was in whole or in part for the following MEDICAL CONDITION: (primary reason for [place]) MEDICAL NECESSITY: I certify, that based on my findings, NURSING services are a medically necessary home health service. HOME BOUND STATUS: I certify that my clinical findings support that this patient is homebound (i.e., Due to illness or injury, pt requires aid of supportive devices such as crutches, cane, wheelchairs, walkers, the use of special transportation or the assistance of another person to leave their place of residence. There is a normal inability to leave the home and doing so requires considerable and taxing effort. Other absences are for medical reasons / religious services and are infrequent or of short duration when for other reasons). [place], [name] o If current dressing causes regression in wound condition, may D/C ordered dressing product/s and apply Normal Saline Moist Dressing daily until next [place] / Other MD appointment. [place] of regression in wound condition at [phone]. o Please direct any NON-WOUND related issues/requests for orders to patient's Primary Care  Physician Wound #5 Right Hand - 3rd Digit o [place] Nurse may visit PRN to address patientos wound care needs. o FACE TO FACE ENCOUNTER: MEDICARE and MEDICAID PATIENTS: I certify that this patient is under my care and that I had a face-to-face encounter that meets the physician face-to-face encounter requirements with this patient on this date. The encounter with the patient was in whole or in part for the following MEDICAL CONDITION: (primary reason for [place]) MEDICAL NECESSITY: I certify, that based on my findings, NURSING services are a medically necessary home health service. HOME BOUND STATUS: I certify that my clinical findings support that this patient is homebound (i.e., Due to illness or injury, pt requires aid of supportive devices such as crutches, cane, wheelchairs, walkers, the use of special transportation or the assistance of another person to leave their place of residence. There is a normal inability to leave the home and doing so requires considerable and taxing effort. Other absences are for medical reasons / religious services and are infrequent or of short duration when for other reasons). o If current dressing causes regression in wound condition, may D/C ordered dressing product/s and apply Normal Saline Moist Dressing daily until next [place] / Other MD appointment. [place] of regression in wound condition at [phone]. o Please direct any NON-WOUND related issues/requests for orders to patient's Primary Care Physician Medications-please add to medication list. Wound #2 Right,Medial Lower Leg o P.O. Antibiotics - Doxycycline twice daily for 7 days Wound #4 Right Hand - 2nd Digit o P.O. Antibiotics - Doxycycline twice daily for 7 days Wound #5 Right Hand - 3rd Digit o P.O. Antibiotics - Doxycycline twice daily for 7 days Patient Medications Allergies:  Singulair, morphine, prednisone, penicillin, metoprolol, diltiazem Notifications Medication Indication Start End doxycycline monohydrate 07/03/2019 DOSE oral 100 mg capsule - 1 capsule oral bid for 7 days Electronic Signature(s) Signed: 07/03/2019 9:41:54 AM By: Linton Ham MD Entered By: Linton Ham on 07/03/2019 09:41:54 [name], [name] -------------------------------------------------------------------------------- Problem List Details Patient Name: [name]  Date of Service: 07/03/2019 8:45 AM Medical Record Number: [id number] Patient Account Number: [id number] Date of Birth/Sex: 15-Oct-1970 (49 y.o. F) Treating RN: Cornell Barman Primary Care Provider: Alma Friendly Other Clinician: Referring Provider: Alma Friendly Treating Provider/Extender: Tito Dine in Treatment: 2 Active Problems ICD-10 Evaluated Encounter Code Description Active Date Today Diagnosis L97.211 Non-pressure chronic ulcer of right calf limited to breakdown 06/19/2019 No Yes of skin L97.821 Non-pressure chronic ulcer of other part of left lower leg 06/19/2019 No Yes limited to breakdown of skin I89.0 Lymphedema, not elsewhere classified 06/19/2019 No Yes E11.622 Type 2 diabetes mellitus with other skin ulcer 06/19/2019 No Yes S61.401D Unspecified open wound of right hand, subsequent encounter 06/19/2019 No Yes S61.402D Unspecified open wound of left hand, subsequent encounter 06/19/2019 No Yes Inactive Problems Resolved Problems Electronic Signature(s) Signed: 07/03/2019 5:48:31 PM By: Linton Ham MD Entered By: Linton Ham on 07/03/2019 09:35:29 [place], [name] -------------------------------------------------------------------------------- Progress Note Details Patient Name: [name] Date of Service: 07/03/2019 8:45 AM Medical Record Number: [id number] Patient Account Number: [id number] Date of Birth/Sex: Nov 28, 1970 (49 y.o. F) Treating RN:  Cornell Barman Primary Care Provider: Alma Friendly Other Clinician: Referring Provider: Alma Friendly Treating Provider/Extender: Tito Dine in Treatment: 2 Subjective History of Present Illness (HPI) ADMISSION 06/19/2019 Patient is a 49 year old woman who is accompanied by her husband. She has severe diabetic peripheral neuropathy which is left her minimally ambulatory. She spends most of the time in a wheelchair. She also has a history of chronic lower extremity edema history of left leg DVT. She states that on 2 separate occasions roughly a month ago she fell with lacerations on the bilateral anterior tibial areas. She was seen by primary care on 06/04/2019 diagnosed with cellulitis of the left leg put on Bactrim DS. Also noted to have hand wounds with a question of referral to hand surgery. She was also sent to our clinic at the same time. The patient's husband is doing the dressings. He has been using silver alginate that was supplied by home health. He is done a really good job the area on the left leg is actually healed only a small superficial area on the right Somewhere in the in the last 2 weeks she gives a bizarre history of going to wash nail polish off the fingers of both hands. She apparently did not realize that her son had put Clorox in the vinegar jar that they used to clean the countertop. She ended up burning her hands. The patient has a wound on the PIP of the left third finger, PIP of the right second finger and the lateral part of the right third finger Past medical history includes COPD, obstructive sleep apnea on nocturnal O2, type 2 diabetes with peripheral neuropathy, retinopathy, chronic diastolic heart failure coronary artery disease, severe diabetic neuropathy type 2 diabetes apparently diagnosed at age 74 on insulin, COPD, continued tobacco abuse, left leg deep DVT, hypertension, TIAs, seizure disorders, asthma The patient is actually had arterial  studies in February of this year. ABIs bilaterally were 1.02 and TBI's bilaterally at 0.6. Waveforms were triphasic and biphasic. She was not felt to have significant arterial disease. 12/23; patient readmitted the clinic last week. She has predominantly venous insufficiency wounds on the right medial lower leg. She also had open areas on the fingers of both hands which happened in a very bizarre way. She has almost complete healing on the right medial calf. The area on the left third finger is healed. She  still has 2 open wounds on the medial aspect of the PIP of the left second finger and the dorsal aspect of the PIP of the third finger. Of these 2 wounds the PIP of the third finger wound is painful and deeper Objective Constitutional Patient is hypertensive.. Pulse regular and within target range for patient.Marland Kitchen Respirations regular, non-labored and within target range.. Temperature is normal and within the target range for the patient.Marland Kitchen appears in no distress. Does not appear to be systemically unwell. Vitals Time Taken: 8:51 AM, Height: 64 in, Weight: 286 lbs, BMI: 49.1, Temperature: 98.0 F, Pulse: 87 bpm, Respiratory Rate: 16 breaths/min, Blood Pressure: 159/69 mmHg. [name], [name] General Notes: Wound exam; the area on the left anterior tibial area is healed. She has a tiny eschared area on the right medial calf which is just about closed. The most concerning areas are on the fingers of her right hand. This includes the left lateral second PIP and the third dorsal PIP. Both of these require debridement. The area on the third dorsal PIP is a full- thickness wound I think approaching the joint capsule. There is also tenderness in the PIP joint in this area and some erythema around the wound I think this is enough to justify antibiotics. Integumentary (Hair, Skin) Wound #2 status is Open. Original cause of wound was Trauma. The wound is located on the Right,Medial Lower Leg.  The wound measures 0.1cm length x 0.1cm width x 0.1cm depth; 0.008cm^2 area and 0.001cm^3 volume. There is no tunneling or undermining noted. There is a medium amount of sanguinous drainage noted. The wound margin is thickened. There is no granulation within the wound bed. There is a large (67-100%) amount of necrotic tissue within the wound bed including Eschar. Wound #3 status is Healed - Epithelialized. Original cause of wound was Trauma. The wound is located on the Left Hand - 3rd Digit. The wound measures 0cm length x 0cm width x 0cm depth; 0cm^2 area and 0cm^3 volume. There is no tunneling or undermining noted. There is a medium amount of sanguinous drainage noted. The wound margin is thickened. There is no granulation within the wound bed. There is a large (67-100%) amount of necrotic tissue within the wound bed including Eschar. Wound #4 status is Open. Original cause of wound was Trauma. The wound is located on the Right Hand - 2nd Digit. The wound measures 0.5cm length x 0.5cm width x 0.1cm depth; 0.196cm^2 area and 0.02cm^3 volume. There is Fat Layer (Subcutaneous Tissue) Exposed exposed. There is no tunneling or undermining noted. There is a medium amount of sanguinous drainage noted. The wound margin is thickened. There is no granulation within the wound bed. There is a large (67-100%) amount of necrotic tissue within the wound bed including Adherent Slough. Wound #5 status is Open. Original cause of wound was Trauma. The wound is located on the Right Hand - 3rd Digit. The wound measures 0.9cm length x 1cm width x 0.2cm depth; 0.707cm^2 area and 0.141cm^3 volume. There is Fat Layer (Subcutaneous Tissue) Exposed exposed. There is no tunneling or undermining noted. There is a medium amount of sanguinous drainage noted. The wound margin is thickened. There is no granulation within the wound bed. There is a large (67-100%) amount of necrotic tissue within the wound bed including Adherent  Slough. Assessment Active Problems ICD-10 Non-pressure chronic ulcer of right calf limited to breakdown of skin Non-pressure chronic ulcer of other part of left lower leg limited to breakdown of skin Lymphedema, not  elsewhere classified Type 2 diabetes mellitus with other skin ulcer Unspecified open wound of right hand, subsequent encounter Unspecified open wound of left hand, subsequent encounter Procedures Wound #4 Pre-procedure diagnosis of Wound #4 is a Trauma, Other located on the Right Hand - 2nd Digit . There was a Excisional Remo, [name] Skin/Subcutaneous Tissue Debridement with a total area of 0.25 sq cm performed by Ricard Dillon, MD. With the following instrument(s): Curette to remove Viable and Non-Viable tissue/material. Material removed includes Subcutaneous Tissue and Slough and after achieving pain control using Lidocaine. No specimens were taken. A time out was conducted at 09:05, prior to the start of the procedure. A Minimum amount of bleeding was controlled with Pressure. The procedure was tolerated well. Post Debridement Measurements: 0.5cm length x 0.5cm width x 0.2cm depth; 0.039cm^3 volume. Character of Wound/Ulcer Post Debridement is stable. Post procedure Diagnosis Wound #4: Same as Pre-Procedure Wound #5 Pre-procedure diagnosis of Wound #5 is a Trauma, Other located on the Right Hand - 3rd Digit . There was a Excisional Skin/Subcutaneous Tissue Debridement with a total area of 0.9 sq cm performed by Ricard Dillon, MD. With the following instrument(s): Curette to remove Viable and Non-Viable tissue/material. Material removed includes Subcutaneous Tissue and Slough and after achieving pain control using Lidocaine. No specimens were taken. A time out was conducted at 09:05, prior to the start of the procedure. A Minimum amount of bleeding was controlled with Pressure. The procedure was tolerated well. Post Debridement Measurements: 0.9cm length  x 1cm width x 0.2cm depth; 0.141cm^3 volume. Character of Wound/Ulcer Post Debridement is stable. Post procedure Diagnosis Wound #5: Same as Pre-Procedure Plan Wound Cleansing: Wound #2 Right,Medial Lower Leg: Clean wound with Normal Saline. May Shower, gently pat wound dry prior to applying new dressing. Wound #4 Right Hand - 2nd Digit: Clean wound with Normal Saline. May Shower, gently pat wound dry prior to applying new dressing. Wound #5 Right Hand - 3rd Digit: Clean wound with Normal Saline. May Shower, gently pat wound dry prior to applying new dressing. Anesthetic (add to Medication List): Wound #2 Right,Medial Lower Leg: Topical Lidocaine 4% cream applied to wound bed prior to debridement (In Clinic Only). Wound #4 Right Hand - 2nd Digit: Topical Lidocaine 4% cream applied to wound bed prior to debridement (In Clinic Only). Wound #5 Right Hand - 3rd Digit: Topical Lidocaine 4% cream applied to wound bed prior to debridement (In Clinic Only). Primary Wound Dressing: Wound #2 Right,Medial Lower Leg: Silver Collagen - moistened with saline Wound #4 Right Hand - 2nd Digit: Silver Collagen - moistened with saline Wound #5 Right Hand - 3rd Digit: Silver Collagen - moistened with saline Secondary Dressing: Wound #2 Right,Medial Lower Leg: Other - coverlet Wound #4 Right Hand - 2nd Digit: Non-adherent pad - Conform and stretch-net to secure Wound #5 Right Hand - 3rd Digit: Non-adherent pad - Conform and stretch-net to secure Dressing Change Frequency: Wound #2 Right,Medial Lower Leg: KELVINA, [name] Change Dressing Monday, Wednesday, Friday Wound #4 Right Hand - 2nd Digit: Change Dressing Monday, Wednesday, Friday Wound #5 Right Hand - 3rd Digit: Change Dressing Monday, Wednesday, Friday Follow-up Appointments: Wound #2 Right,Medial Lower Leg: Return Appointment in 1 week. Wound #4 Right Hand - 2nd Digit: Return Appointment in 1 week. Wound #5 Right Hand -  3rd Digit: Return Appointment in 1 week. Edema Control: Wound #2 Right,Medial Lower Leg: Elevate legs to the level of the heart and pump ankles as often as possible Home Health:  Wound #2 Right,Medial Lower Leg: [place] Nurse may visit PRN to address patient s wound care needs. FACE TO FACE ENCOUNTER: MEDICARE and MEDICAID PATIENTS: I certify that this patient is under my care and that I had a face-to-face encounter that meets the physician face-to-face encounter requirements with this patient on this date. The encounter with the patient was in whole or in part for the following MEDICAL CONDITION: (primary reason for [place]) MEDICAL NECESSITY: I certify, that based on my findings, NURSING services are a medically necessary home health service. HOME BOUND STATUS: I certify that my clinical findings support that this patient is homebound (i.e., Due to illness or injury, pt requires aid of supportive devices such as crutches, cane, wheelchairs, walkers, the use of special transportation or the assistance of another person to leave their place of residence. There is a normal inability to leave the home and doing so requires considerable and taxing effort. Other absences are for medical reasons / religious services and are infrequent or of short duration when for other reasons). If current dressing causes regression in wound condition, may D/C ordered dressing product/s and apply Normal Saline Moist Dressing daily until next [place] / Other MD appointment. [place] of regression in wound condition at [phone]. Please direct any NON-WOUND related issues/requests for orders to patient's Primary Care Physician Wound #4 Right Hand - 2nd Digit: [place] Nurse may visit PRN to address patient s wound care needs. FACE TO FACE ENCOUNTER: MEDICARE and MEDICAID  PATIENTS: I certify that this patient is under my care and that I had a face-to-face encounter that meets the physician face-to-face encounter requirements with this patient on this date. The encounter with the patient was in whole or in part for the following MEDICAL CONDITION: (primary reason for [place]) MEDICAL NECESSITY: I certify, that based on my findings, NURSING services are a medically necessary home health service. HOME BOUND STATUS: I certify that my clinical findings support that this patient is homebound (i.e., Due to illness or injury, pt requires aid of supportive devices such as crutches, cane, wheelchairs, walkers, the use of special transportation or the assistance of another person to leave their place of residence. There is a normal inability to leave the home and doing so requires considerable and taxing effort. Other absences are for medical reasons / religious services and are infrequent or of short duration when for other reasons). If current dressing causes regression in wound condition, may D/C ordered dressing product/s and apply Normal Saline Moist Dressing daily until next [place] / Other MD appointment. [place] of regression in wound condition at [phone]. Please direct any NON-WOUND related issues/requests for orders to patient's Primary Care Physician Wound #5 Right Hand - 3rd Digit: [place] Nurse may visit PRN to address patient s wound care needs. FACE TO FACE ENCOUNTER: MEDICARE and MEDICAID PATIENTS: I certify that this patient is under my care and that I had a face-to-face encounter that meets the physician face-to-face encounter requirements with this patient on this date. The encounter with the patient was in whole or in part for the following MEDICAL CONDITION: (primary reason for [place]) MEDICAL NECESSITY: I certify, that based on my findings, NURSING  services are a medically necessary home health service. HOME BOUND STATUS: I certify that my clinical findings support that  this patient is homebound (i.e., Due to illness or injury, pt requires aid of supportive devices such as crutches, cane, wheelchairs, walkers, the use of special transportation or the assistance of another person to leave their place of residence. There is a normal inability to leave the home and doing so requires considerable and taxing effort. Other absences are for medical reasons / religious services and are infrequent or of short duration when for other reasons). [place], [name] If current dressing causes regression in wound condition, may D/C ordered dressing product/s and apply Normal Saline Moist Dressing daily until next [place] / Other MD appointment. [place] of regression in wound condition at [phone]. Please direct any NON-WOUND related issues/requests for orders to patient's Primary Care Physician Medications-please add to medication list.: Wound #2 Right,Medial Lower Leg: P.O. Antibiotics - Doxycycline twice daily for 7 days Wound #4 Right Hand - 2nd Digit: P.O. Antibiotics - Doxycycline twice daily for 7 days Wound #5 Right Hand - 3rd Digit: P.O. Antibiotics - Doxycycline twice daily for 7 days The following medication(s) was prescribed: doxycycline monohydrate oral 100 mg capsule 1 capsule oral bid for 7 days starting 07/03/2019 1. Continue with silver collagen especially to the wounds on her hands 2. Silver collagen under compression in the right leg which should be healed next week. I talked to her about stockings 3. I am concerned about the possibility that infection in the right third finger and I put her on empiric doxycycline 100 twice daily for 7 days no cultures were done Electronic Signature(s) Signed: 07/03/2019 5:48:31 PM By: Linton Ham MD Previous Signature: 07/03/2019 9:42:23 AM Version  By: Linton Ham MD Entered By: Linton Ham on 07/03/2019 09:42:52 [place], [name] -------------------------------------------------------------------------------- SuperBill Details Patient Name: [name] Date of Service: 07/03/2019 Medical Record Number: [id number] Patient Account Number: [id number] Date of Birth/Sex: March 22, 1971 (49 y.o. F) Treating RN: Cornell Barman Primary Care Provider: Alma Friendly Other Clinician: Referring Provider: Alma Friendly Treating Provider/Extender: Tito Dine in Treatment: 2 Diagnosis Coding ICD-10 Codes Code Description [id number] Non-pressure chronic ulcer of right calf limited to breakdown of skin L97.821 Non-pressure chronic ulcer of other part of left lower leg limited to breakdown of skin I89.0 Lymphedema, not elsewhere classified E11.622 Type 2 diabetes mellitus with other skin ulcer S61.401D Unspecified open wound of right hand, subsequent encounter S61.402D Unspecified open wound of left hand, subsequent encounter Facility Procedures CPT4 Code: [id number] Description: [id number] - DEB SUBQ TISSUE 20 SQ CM/< ICD-10 Diagnosis Description S61.401D Unspecified open wound of right hand, subsequent encounter Modifier: Quantity: 1 Physician Procedures CPT4 Code: [id number] Description: [id number] - WC PHYS SUBQ TISS 20 SQ CM ICD-10 Diagnosis Description S61.401D Unspecified open wound of right hand, subsequent encounter Modifier: Quantity: 1 Electronic Signature(s) Signed: 07/03/2019 5:48:31 PM By: Linton Ham MD Entered By: Linton Ham on 07/03/2019 09:43:13

## 2019-07-17 NOTE — Telephone Encounter (Signed)
Spoken and notified patient of [name]'s comments. Patient verbalized understanding.  

## 2019-07-17 NOTE — Progress Notes (Signed)
THENA, FLEISHMAN ([id number]) Visit Report for 07/03/2019 Arrival Information Details Patient Name: [name], [name] Date of Service: 07/03/2019 8:45 AM Medical Record Number: [id number] Patient Account Number: [id number] Date of Birth/Sex: 1970/09/04 (49 y.o. F) Treating RN: Army Melia Primary Care Eulah Walkup: Alma Friendly Other Clinician: Referring Joanna Borawski: Alma Friendly Treating Loy Little/Extender: Tito Dine in Treatment: 2 Visit Information History Since Last Visit Added or deleted any medications: No Patient Arrived: Wheel Chair Any new allergies or adverse reactions: No Arrival Time: 08:50 Had a fall or experienced change in No Accompanied By: husband activities of daily living that may affect Transfer Assistance: None risk of falls: Patient Identification Verified: Yes Signs or symptoms of abuse/neglect since last visito No Patient Has Alerts: Yes Hospitalized since last visit: No Patient Alerts: ABI L/R 1.02 TBI L/R 0.6 Has Dressing in Place as Prescribed: Yes Pain Present Now: Yes Electronic Signature(s) Signed: 07/03/2019 11:52:26 AM By: Army Melia Entered By: Army Melia on 07/03/2019 08:50:51 [employer], [name] ([id number]) -------------------------------------------------------------------------------- Encounter Discharge Information Details Patient Name: [name] Date of Service: 07/03/2019 8:45 AM Medical Record Number: [id number] Patient Account Number: [id number] Date of Birth/Sex: 1970/12/17 (50 y.o. F) Treating RN: Cornell Barman Primary Care Fynley Chrystal: Alma Friendly Other Clinician: Referring Ahmod Gillespie: Alma Friendly Treating Monice Lundy/Extender: Tito Dine in Treatment: 2 Encounter Discharge Information Items Post Procedure Vitals Discharge Condition: Stable Temperature (F): 98.0 Ambulatory Status: Ambulatory Pulse (bpm): 87 Discharge Destination: Home Respiratory Rate (breaths/min): 16 Transportation: Private  Auto Blood Pressure (mmHg): 159/69 Accompanied By: self Schedule Follow-up Appointment: Yes Clinical Summary of Care: Electronic Signature(s) Signed: 07/17/2019 4:51:46 PM By: Gretta Cool, BSN, RN, CWS, Kim RN, BSN Entered By: Gretta Cool, BSN, RN, CWS, Kim on 07/03/2019 09:14:21 [name] -------------------------------------------------------------------------------- Lower Extremity Assessment Details Patient Name: [name] Date of Service: 07/03/2019 8:45 AM Medical Record Number: [id number] Patient Account Number: [id number] Date of Birth/Sex: 11/06/1970 (49 y.o. F) Treating RN: Army Melia Primary Care Maelin Kurkowski: Alma Friendly Other Clinician: Referring Aleese Kamps: Alma Friendly Treating Evanell Redlich/Extender: Ricard Dillon Weeks in Treatment: 2 Edema Assessment Assessed: [Left: No] [Right: No] Edema: [Left: N] [Right: o] Vascular Assessment Pulses: Dorsalis Pedis Palpable: [Right:Yes] Electronic Signature(s) Signed: 07/03/2019 11:52:26 AM By: Army Melia Entered By: Army Melia on 07/03/2019 08:58:52 Bauza, Rethel ([id number]) -------------------------------------------------------------------------------- Multi Wound Chart Details Patient Name: [name] Date of Service: 07/03/2019 8:45 AM Medical Record Number: [id number] Patient Account Number: [id number] Date of Birth/Sex: 1971-02-25 (49 y.o. F) Treating RN: Cornell Barman Primary Care Alta Shober: Alma Friendly Other Clinician: Referring Mykell Genao: Alma Friendly Treating Meshelle Holness/Extender: Tito Dine in Treatment: 2 Vital Signs Height(in): 64 Pulse(bpm): 80 Weight(lbs): 286 Blood Pressure(mmHg): 159/69 Body Mass Index(BMI): 49 Temperature(F): 98.0 Respiratory Rate 16 (breaths/min): Photos: Wound Location: Right, Medial Lower Leg Left Hand - 3rd Digit Right Hand - 2nd Digit Wounding Event: Trauma Trauma Trauma Primary Etiology: Diabetic Wound/Ulcer of the Trauma, Other  Trauma, Other Lower Extremity Comorbid History: Asthma, Chronic Obstructive Asthma, Chronic Obstructive Asthma, Chronic Obstructive Pulmonary Disease (COPD), Pulmonary Disease (COPD), Pulmonary Disease (COPD), Sleep Apnea, Arrhythmia, Sleep Apnea, Arrhythmia, Sleep Apnea, Arrhythmia, Congestive Heart Failure, Congestive Heart Failure, Congestive Heart Failure, Coronary Artery Disease, Coronary Artery Disease, Coronary Artery Disease, Hypertension, Type II Diabetes Hypertension, Type II Diabetes Hypertension, Type II Diabetes Date Acquired: 05/20/2019 05/20/2019 05/20/2019 Weeks of Treatment: 2 2 2  Wound Status: Open Healed - Epithelialized Open Measurements L x W x D 0.1x0.1x0.1 0x0x0 0.5x0.5x0.1 (cm) Area (cm) : 0.008 0 0.196 Volume (cm) : 0.001 0 0.02 % Reduction in Area:  97.50% 100.00% 16.90% % Reduction in Volume: 96.80% 100.00% 85.80% Classification: Grade 2 Full Thickness Without Full Thickness Without Exposed Support Structures Exposed Support Structures Exudate Amount: Medium Medium Medium Exudate Type: Sanguinous Sanguinous Sanguinous Exudate Color: red red red Wound Margin: Thickened Thickened Thickened Granulation Amount: None Present (0%) None Present (0%) None Present (0%) Necrotic Amount: Large (67-100%) Large (67-100%) Large (67-100%) Necrotic Tissue: Eschar Eschar Adherent Slough Exposed Structures: TALYSHA, DOLLARD ([id number]) Fascia: No Fascia: No Fat Layer (Subcutaneous Fat Layer (Subcutaneous Fat Layer (Subcutaneous Tissue) Exposed: Yes Tissue) Exposed: No Tissue) Exposed: No Fascia: No Tendon: No Tendon: No Tendon: No Muscle: No Muscle: No Muscle: No Joint: No Joint: No Joint: No Bone: No Bone: No Bone: No Epithelialization: None Large (67-100%) None Debridement: N/A N/A Debridement - Excisional Pre-procedure N/A N/A 09:05 Verification/Time Out Taken: Pain Control: N/A N/A Lidocaine Tissue Debrided: N/A N/A Subcutaneous, Slough Level: N/A N/A  Skin/Subcutaneous Tissue Debridement Area (sq cm): N/A N/A 0.25 Instrument: N/A N/A Curette Bleeding: N/A N/A Minimum Hemostasis Achieved: N/A N/A Pressure Debridement Treatment N/A N/A Procedure was tolerated well Response: Post Debridement N/A N/A 0.5x0.5x0.2 Measurements L x W x D (cm) Post Debridement Volume: N/A N/A 0.039 (cm) Procedures Performed: N/A N/A Debridement Wound Number: 5 N/A N/A Photos: N/A N/A Wound Location: Right Hand - 3rd Digit N/A N/A Wounding Event: Trauma N/A N/A Primary Etiology: Trauma, Other N/A N/A Comorbid History: Asthma, Chronic Obstructive N/A N/A Pulmonary Disease (COPD), Sleep Apnea, Arrhythmia, Congestive Heart Failure, Coronary Artery Disease, Hypertension, Type II Diabetes Date Acquired: 05/20/2019 N/A N/A Weeks of Treatment: 2 N/A N/A Wound Status: Open N/A N/A Measurements L x W x D 0.9x1x0.2 N/A N/A (cm) Area (cm) : 0.707 N/A N/A Volume (cm) : 0.141 N/A N/A % Reduction in Area: -25.10% N/A N/A % Reduction in Volume: -24.80% N/A N/A Classification: Full Thickness Without N/A N/A Exposed Support Structures Exudate Amount: Medium N/A N/A Roker, Jaziah ([id number]) Exudate Type: Sanguinous N/A N/A Exudate Color: red N/A N/A Wound Margin: Thickened N/A N/A Granulation Amount: None Present (0%) N/A N/A Necrotic Amount: Large (67-100%) N/A N/A Necrotic Tissue: Adherent Slough N/A N/A Exposed Structures: Fat Layer (Subcutaneous N/A N/A Tissue) Exposed: Yes Fascia: No Tendon: No Muscle: No Joint: No Bone: No Epithelialization: None N/A N/A Debridement: Debridement - Excisional N/A N/A Pre-procedure 09:05 N/A N/A Verification/Time Out Taken: Pain Control: Lidocaine N/A N/A Tissue Debrided: Subcutaneous, Slough N/A N/A Level: Skin/Subcutaneous Tissue N/A N/A Debridement Area (sq cm): 0.9 N/A N/A Instrument: Curette N/A N/A Bleeding: Minimum N/A N/A Hemostasis Achieved: Pressure N/A N/A Debridement Treatment Procedure was  tolerated well N/A N/A Response: Post Debridement 0.9x1x0.2 N/A N/A Measurements L x W x D (cm) Post Debridement Volume: 0.141 N/A N/A (cm) Procedures Performed: Debridement N/A N/A Treatment Notes Wound #2 (Right, Medial Lower Leg) Notes prisma and BFD or coverlets used Wound #4 (Right Hand - 2nd Digit) Notes prisma and BFD or coverlets used Wound #5 (Right Hand - 3rd Digit) Notes prisma and BFD or coverlets used Electronic Signature(s) Signed: 07/03/2019 5:48:31 PM By: Linton Ham MD Entered By: Linton Ham on 07/03/2019 09:35:36 [place], [name] ([id number]) -------------------------------------------------------------------------------- Multi-Disciplinary Care Plan Details Patient Name: [name] Date of Service: 07/03/2019 8:45 AM Medical Record Number: [id number] Patient Account Number: [id number] Date of Birth/Sex: [birth year] (49 y.o. F) Treating RN: Cornell Barman Primary Care Yandiel Bergum: Alma Friendly Other Clinician: Referring Samhitha Rosen: Alma Friendly Treating Jakai Risse/Extender: Tito Dine in Treatment: 2 Active Inactive Abuse / Safety / Falls / Self Care Management  Nursing Diagnoses: History of Falls Goals: Patient will not develop complications from immobility Date Initiated: 06/19/2019 Target Resolution Date: 07/10/2019 Goal Status: Active Patient will remain injury free related to falls Date Initiated: 06/19/2019 Target Resolution Date: 07/10/2019 Goal Status: Active Interventions: Assess fall risk on admission and as needed Treatment Activities: Patient referred to home care : 06/19/2019 Notes: Orientation to the Wound Care Program Nursing Diagnoses: Knowledge deficit related to the wound healing center program Goals: Patient/caregiver will verbalize understanding of the [facility] Date Initiated: 06/19/2019 Target Resolution Date: 07/10/2019 Goal Status: Active Interventions: Provide education on  orientation to the wound center Notes: Peripheral Neuropathy Nursing Diagnoses: Knowledge deficit related to disease process and management of peripheral neurovascular dysfunction Potential alteration in peripheral tissue perfusion (select prior to confirmation of diagnosis) [place], [name] ([id number]) Goals: Patient/caregiver will verbalize understanding of disease process and disease management Date Initiated: 06/19/2019 Target Resolution Date: 07/10/2019 Goal Status: Active Interventions: Assess signs and symptoms of neuropathy upon admission and as needed Treatment Activities: Patient referred for customized footwear/offloading : 06/19/2019 Notes: Wound/Skin Impairment Nursing Diagnoses: Impaired tissue integrity Goals: Patient/caregiver will verbalize understanding of skin care regimen Date Initiated: 06/19/2019 Target Resolution Date: 07/10/2019 Goal Status: Active Ulcer/skin breakdown will have a volume reduction of 30% by week 4 Date Initiated: 06/19/2019 Target Resolution Date: 07/20/2019 Goal Status: Active Interventions: Assess patient/caregiver ability to obtain necessary supplies Assess ulceration(s) every visit Treatment Activities: Skin care regimen initiated : 06/19/2019 Topical wound management initiated : 06/19/2019 Notes: Electronic Signature(s) Signed: 07/17/2019 4:51:46 PM By: Gretta Cool, BSN, RN, CWS, Kim RN, BSN Entered By: Gretta Cool, BSN, RN, CWS, Kim on 07/03/2019 [place], [name] ([id number]) -------------------------------------------------------------------------------- Pain Assessment Details Patient Name: [name] Date of Service: 07/03/2019 8:45 AM Medical Record Number: [id number] Patient Account Number: [id number] Date of Birth/Sex: 05-25-1971 (49 y.o. F) Treating RN: Army Melia Primary Care Artisha Capri: Alma Friendly Other Clinician: Referring Terease Marcotte: Alma Friendly Treating Callan Norden/Extender: Tito Dine in Treatment:  2 Active Problems Location of Pain Severity and Description of Pain Patient Has Paino Yes Site Locations Pain Location: Pain in Ulcers Rate the pain. Current Pain Level: 8 Pain Management and Medication Current Pain Management: Electronic Signature(s) Signed: 07/03/2019 11:52:26 AM By: Army Melia Entered By: Army Melia on 07/03/2019 08:51:00 [name] -------------------------------------------------------------------------------- Patient/Caregiver Education Details Patient Name: [name] Date of Service: 07/03/2019 8:45 AM Medical Record Number: [id number] Patient Account Number: [id number] Date of Birth/Gender: December 15, 1970 (49 y.o. F) Treating RN: Cornell Barman Primary Care Physician: Alma Friendly Other Clinician: Referring Physician: Alma Friendly Treating Physician/Extender: Tito Dine in Treatment: 2 Education Assessment Education Provided To: Patient Education Topics Provided Wound/Skin Impairment: Handouts: Caring for Your Ulcer Methods: Demonstration, Explain/Verbal Responses: State content correctly Electronic Signature(s) Signed: 07/17/2019 4:51:46 PM By: Gretta Cool, BSN, RN, CWS, Kim RN, BSN Entered By: Gretta Cool, BSN, RN, CWS, Kim on 07/03/2019 09:12:58 [name] -------------------------------------------------------------------------------- Wound Assessment Details Patient Name: [name] Date of Service: 07/03/2019 8:45 AM Medical Record Number: [id number] Patient Account Number: [id number] Date of Birth/Sex: 1970/09/24 (49 y.o. F) Treating RN: Cornell Barman Primary Care Aleli Navedo: Alma Friendly Other Clinician: Referring Sydney Hasten: Alma Friendly Treating Aviraj Kentner/Extender: Tito Dine in Treatment: 2 Wound Status Wound Number: 2 Primary Diabetic Wound/Ulcer of the Lower Extremity Etiology: Wound Location: Right, Medial Lower Leg Wound Open Wounding Event: Trauma Status: Date  Acquired: 05/20/2019 Comorbid Asthma, Chronic Obstructive Pulmonary Disease Weeks Of Treatment: 2 History: (COPD), Sleep Apnea, Arrhythmia, Congestive Clustered Wound: No Heart Failure, Coronary  Artery Disease, Hypertension, Type II Diabetes Photos Wound Measurements Length: (cm) 0.1 % Reduction Width: (cm) 0.1 % Reduction Depth: (cm) 0.1 Epithelializ Area: (cm) 0.008 Tunneling: Volume: (cm) 0.001 Undermining in Area: 97.5% in Volume: 96.8% ation: None No : No Wound Description Classification: Grade 2 Foul Odor A Wound Margin: Thickened Slough/Fibr Exudate Amount: Medium Exudate Type: Sanguinous Exudate Color: red fter Cleansing: No ino Yes Wound Bed Granulation Amount: None Present (0%) Exposed Structure Necrotic Amount: Large (67-100%) Fascia Exposed: No Necrotic Quality: Eschar Fat Layer (Subcutaneous Tissue) Exposed: No Tendon Exposed: No Muscle Exposed: No Joint Exposed: No Bone Exposed: No TIMMIA, VIDAL ([id number]) Electronic Signature(s) Signed: 07/17/2019 4:51:46 PM By: Gretta Cool, BSN, RN, CWS, Kim RN, BSN Entered By: Gretta Cool, BSN, RN, CWS, Kim on 07/03/2019 09:06:09 [name] -------------------------------------------------------------------------------- Wound Assessment Details Patient Name: [name] Date of Service: 07/03/2019 8:45 AM Medical Record Number: [id number] Patient Account Number: [id number] Date of Birth/Sex: 1970-11-02 (49 y.o. F) Treating RN: Cornell Barman Primary Care Chayim Bialas: Alma Friendly Other Clinician: Referring Jaquanna Ballentine: Alma Friendly Treating Amanat Hackel/Extender: Tito Dine in Treatment: 2 Wound Status Wound Number: 3 Primary Trauma, Other Etiology: Wound Location: Left Hand - 3rd Digit Wound Healed - Epithelialized Wounding Event: Trauma Status: Date Acquired: 05/20/2019 Comorbid Asthma, Chronic Obstructive Pulmonary Disease Weeks Of Treatment: 2 History: (COPD), Sleep Apnea, Arrhythmia,  Congestive Clustered Wound: No Heart Failure, Coronary Artery Disease, Hypertension, Type II Diabetes Photos Wound Measurements Length: (cm) 0 Width: (cm) 0 Depth: (cm) 0 Area: (cm) 0 Volume: (cm) 0 % Reduction in Area: 100% % Reduction in Volume: 100% Epithelialization: Large (67-100%) Tunneling: No Undermining: No Wound Description Full Thickness Without Exposed Support Foul Classification: Structures Sloug Wound Margin: Thickened Exudate Medium Amount: Exudate Type: Sanguinous Exudate Color: red Odor After Cleansing: No h/Fibrino Yes Wound Bed Granulation Amount: None Present (0%) Exposed Structure Necrotic Amount: Large (67-100%) Fascia Exposed: No Necrotic Quality: Eschar Fat Layer (Subcutaneous Tissue) Exposed: No Tendon Exposed: No Muscle Exposed: No Joint Exposed: No Bone Exposed: No TAMEAH, LURRY ([id number]) Electronic Signature(s) Signed: 07/17/2019 4:51:46 PM By: Gretta Cool, BSN, RN, CWS, Kim RN, BSN Entered By: Gretta Cool, BSN, RN, CWS, Kim on 07/03/2019 09:05:54 [place], [name] ([id number]) -------------------------------------------------------------------------------- Wound Assessment Details Patient Name: [name] Date of Service: 07/03/2019 8:45 AM Medical Record Number: [id number] Patient Account Number: [id number] Date of Birth/Sex: [birth year] (50 y.o. F) Treating RN: Army Melia Primary Care Marrell Dicaprio: Alma Friendly Other Clinician: Referring Lyan Moyano: Alma Friendly Treating Aseneth Hack/Extender: Tito Dine in Treatment: 2 Wound Status Wound Number: 4 Primary Trauma, Other Etiology: Wound Location: Right Hand - 2nd Digit Wound Open Wounding Event: Trauma Status: Date Acquired: 05/20/2019 Comorbid Asthma, Chronic Obstructive Pulmonary Disease Weeks Of Treatment: 2 History: (COPD), Sleep Apnea, Arrhythmia, Congestive Clustered Wound: No Heart Failure, Coronary Artery Disease, Hypertension, Type II Diabetes Photos Wound  Measurements Length: (cm) 0.5 Width: (cm) 0.5 Depth: (cm) 0.1 Area: (cm) 0.196 Volume: (cm) 0.02 % Reduction in Area: 16.9% % Reduction in Volume: 85.8% Epithelialization: None Tunneling: No Undermining: No Wound Description Full Thickness Without Exposed Support Classification: Structures Wound Margin: Thickened Exudate Medium Amount: Exudate Type: Sanguinous Exudate Color: red Foul Odor After Cleansing: No Slough/Fibrino Yes Wound Bed Granulation Amount: None Present (0%) Exposed Structure Necrotic Amount: Large (67-100%) Fascia Exposed: No Necrotic Quality: Adherent Slough Fat Layer (Subcutaneous Tissue) Exposed: Yes Tendon Exposed: No Muscle Exposed: No Joint Exposed: No Bone Exposed: No Natal, Joylyn ([id number]) Electronic Signature(s) Signed: 07/03/2019 11:52:26 AM By: Army Melia Entered By: Army Melia on 07/03/2019  08:57:52 MCKENLIE, GRANDJEAN ([id number]) -------------------------------------------------------------------------------- Wound Assessment Details Patient Name: MELENIE, TORIO Date of Service: 07/03/2019 8:45 AM Medical Record Number: [id number] Patient Account Number: [id number] Date of Birth/Sex: [birth year] (49 y.o. F) Treating RN: Army Melia Primary Care Jozalynn Noyce: Alma Friendly Other Clinician: Referring Sherrell Farish: Alma Friendly Treating Embree Brawley/Extender: Tito Dine in Treatment: 2 Wound Status Wound Number: 5 Primary Trauma, Other Etiology: Wound Location: Right Hand - 3rd Digit Wound Open Wounding Event: Trauma Status: Date Acquired: 05/20/2019 Comorbid Asthma, Chronic Obstructive Pulmonary Disease Weeks Of Treatment: 2 History: (COPD), Sleep Apnea, Arrhythmia, Congestive Clustered Wound: No Heart Failure, Coronary Artery Disease, Hypertension, Type II Diabetes Photos Wound Measurements Length: (cm) 0.9 Width: (cm) 1 Depth: (cm) 0.2 Area: (cm) 0.707 Volume: (cm) 0.141 % Reduction in Area: -25.1% %  Reduction in Volume: -24.8% Epithelialization: None Tunneling: No Undermining: No Wound Description Full Thickness Without Exposed Support Classification: Structures Wound Margin: Thickened Exudate Medium Amount: Exudate Type: Sanguinous Exudate Color: red Foul Odor After Cleansing: No Slough/Fibrino Yes Wound Bed Granulation Amount: None Present (0%) Exposed Structure Necrotic Amount: Large (67-100%) Fascia Exposed: No Necrotic Quality: Adherent Slough Fat Layer (Subcutaneous Tissue) Exposed: Yes Tendon Exposed: No Muscle Exposed: No Joint Exposed: No Bone Exposed: No Ly, Kaytlin ([id number]) Electronic Signature(s) Signed: 07/03/2019 11:52:26 AM By: Army Melia Entered By: Army Melia on 07/03/2019 08:58:30 [place], [name] ([id number]) -------------------------------------------------------------------------------- [place] Details Patient Name: [name] Date of Service: 07/03/2019 8:45 AM Medical Record Number: [id number] Patient Account Number: [id number] Date of Birth/Sex: 1970-08-19 (49 y.o. F) Treating RN: Army Melia Primary Care Latonja Bobeck: Alma Friendly Other Clinician: Referring Forestine Macho: Alma Friendly Treating Brison Fiumara/Extender: Tito Dine in Treatment: 2 Vital Signs Time Taken: 08:51 Temperature (F): 98.0 Height (in): 64 Pulse (bpm): 87 Weight (lbs): 286 Respiratory Rate (breaths/min): 16 Body Mass Index (BMI): 49.1 Blood Pressure (mmHg): 159/69 Reference Range: 80 - 120 mg / dl Electronic Signature(s) Signed: 07/03/2019 11:52:26 AM By: Army Melia Entered By: Army Melia on 07/03/2019 08:51:31

## 2019-07-17 NOTE — Telephone Encounter (Signed)
Patient stated that she was told that there is not infection on her finger when she went to the wound center  She is not feeling any better but not worse. She having only diarrhea and vomiting at times. She cannot hold food down at all.   No change since the doxycyline

## 2019-07-17 NOTE — Telephone Encounter (Signed)
Was told by nurse at wound care  Regarding antibiotic,she stated that patient was evaluated and Dr Dellia Nims does not think further antibiotic is needed.    Regarding patient's nausea, she stated patient have stop taking Doxycyline since Sunday and does not think this is from the antibiotic. Dr Dellia Nims told patient to contact primary because nausea could be coming a different issue.

## 2019-07-17 NOTE — Telephone Encounter (Signed)
Please notify patient that they do not believe she requires any additional antibiotics. How is her nausea? Any better? Worse?   Any abdominal pain, diarrhea, vomiting, cough, fever, loss of taste/smell? Any other new medications?

## 2019-07-18 ENCOUNTER — Other Ambulatory Visit (INDEPENDENT_AMBULATORY_CARE_PROVIDER_SITE_OTHER): Payer: BC Managed Care – PPO

## 2019-07-18 ENCOUNTER — Ambulatory Visit
Admission: RE | Admit: 2019-07-18 | Discharge: 2019-07-18 | Disposition: A | Discharge status: Home / Self Care | Payer: BC Managed Care – PPO | Source: Ambulatory Visit | Attending: Internal Medicine | Admitting: Internal Medicine

## 2019-07-18 ENCOUNTER — Ambulatory Visit: Payer: BC Managed Care – PPO

## 2019-07-18 DIAGNOSIS — R112 Nausea with vomiting, unspecified: Secondary | ICD-10-CM | POA: Diagnosis not present

## 2019-07-18 DIAGNOSIS — T148XXA Other injury of unspecified body region, initial encounter: Secondary | ICD-10-CM

## 2019-07-18 DIAGNOSIS — S61202A Unspecified open wound of right middle finger without damage to nail, initial encounter: Secondary | ICD-10-CM | POA: Diagnosis not present

## 2019-07-18 NOTE — Addendum Note (Signed)
Addended by: Cloyd Stagers on: 07/18/2019 03:05 PM   Modules accepted: Orders

## 2019-07-18 NOTE — Progress Notes (Signed)
Error  error

## 2019-07-19 LAB — CBC WITH DIFFERENTIAL/PLATELET
Absolute Monocytes: 898 cells/uL (ref 200–950)
Basophils Absolute: 123 cells/uL (ref 0–200)
Basophils Relative: 1.4 %
Eosinophils Absolute: 202 cells/uL (ref 15–500)
Eosinophils Relative: 2.3 %
HCT: 44.3 % (ref 35.0–45.0)
Hemoglobin: 15 g/dL (ref 11.7–15.5)
Lymphs Abs: 1716 cells/uL (ref 850–3900)
MCH: 30 pg (ref 27.0–33.0)
MCHC: 33.9 g/dL (ref 32.0–36.0)
MCV: 88.6 fL (ref 80.0–100.0)
MPV: 13 fL — ABNORMAL HIGH (ref 7.5–12.5)
Monocytes Relative: 10.2 %
Neutro Abs: 5861 cells/uL (ref 1500–7800)
Neutrophils Relative %: 66.6 %
Platelets: 201 10*3/uL (ref 140–400)
RBC: 5 10*6/uL (ref 3.80–5.10)
RDW: 14.7 % (ref 11.0–15.0)
Total Lymphocyte: 19.5 %
WBC: 8.8 10*3/uL (ref 3.8–10.8)

## 2019-07-19 LAB — COMPREHENSIVE METABOLIC PANEL
AG Ratio: 0.8 (calc) — ABNORMAL LOW (ref 1.0–2.5)
ALT: 51 U/L — ABNORMAL HIGH (ref 6–29)
AST: 44 U/L — ABNORMAL HIGH (ref 10–35)
Albumin: 3.8 g/dL (ref 3.6–5.1)
Alkaline phosphatase (APISO): 137 U/L (ref 37–153)
BUN/Creatinine Ratio: 33 (calc) — ABNORMAL HIGH (ref 6–22)
BUN: 52 mg/dL — ABNORMAL HIGH (ref 7–25)
CO2: 34 mmol/L — ABNORMAL HIGH (ref 20–32)
Calcium: 9.9 mg/dL (ref 8.6–10.4)
Chloride: 85 mmol/L — ABNORMAL LOW (ref 98–110)
Creat: 1.59 mg/dL — ABNORMAL HIGH (ref 0.50–1.05)
Globulin: 4.5 g/dL (calc) — ABNORMAL HIGH (ref 1.9–3.7)
Glucose, Bld: 447 mg/dL — ABNORMAL HIGH (ref 65–99)
Potassium: 4.4 mmol/L (ref 3.5–5.3)
Sodium: 131 mmol/L — ABNORMAL LOW (ref 135–146)
Total Bilirubin: 0.8 mg/dL (ref 0.2–1.2)
Total Protein: 8.3 g/dL — ABNORMAL HIGH (ref 6.1–8.1)

## 2019-07-19 NOTE — Telephone Encounter (Signed)
After speaking with patient on 07/17/2018 and gave her lab results, patient stated that she was okay. Now she called back and  is having diarrhea again and patient wanted to know what can she take to help. No vomiting.

## 2019-07-19 NOTE — Telephone Encounter (Signed)
She can take Imodium once daily as needed for diarrhea.  Have her hydrate well and notify me if she continues to have diarrhea over the weekend.

## 2019-07-19 NOTE — Progress Notes (Signed)
Opened in error and closed

## 2019-07-22 NOTE — Telephone Encounter (Signed)
Spoken and notified patient of [name] Clark's comments. Patient verbalized understanding.  

## 2019-07-24 ENCOUNTER — Encounter: Payer: BC Managed Care – PPO | Admitting: Internal Medicine

## 2019-07-24 ENCOUNTER — Other Ambulatory Visit: Payer: Self-pay

## 2019-07-24 DIAGNOSIS — Z794 Long term (current) use of insulin: Secondary | ICD-10-CM | POA: Diagnosis not present

## 2019-07-24 DIAGNOSIS — Z8673 Personal history of transient ischemic attack (TIA), and cerebral infarction without residual deficits: Secondary | ICD-10-CM | POA: Diagnosis not present

## 2019-07-24 DIAGNOSIS — E1142 Type 2 diabetes mellitus with diabetic polyneuropathy: Secondary | ICD-10-CM | POA: Diagnosis not present

## 2019-07-24 DIAGNOSIS — I890 Lymphedema, not elsewhere classified: Secondary | ICD-10-CM | POA: Diagnosis not present

## 2019-07-24 DIAGNOSIS — I2510 Atherosclerotic heart disease of native coronary artery without angina pectoris: Secondary | ICD-10-CM | POA: Diagnosis not present

## 2019-07-24 DIAGNOSIS — S61401D Unspecified open wound of right hand, subsequent encounter: Secondary | ICD-10-CM | POA: Diagnosis not present

## 2019-07-24 DIAGNOSIS — G4733 Obstructive sleep apnea (adult) (pediatric): Secondary | ICD-10-CM | POA: Diagnosis not present

## 2019-07-24 DIAGNOSIS — F17200 Nicotine dependence, unspecified, uncomplicated: Secondary | ICD-10-CM | POA: Diagnosis not present

## 2019-07-24 DIAGNOSIS — I5032 Chronic diastolic (congestive) heart failure: Secondary | ICD-10-CM | POA: Diagnosis not present

## 2019-07-24 DIAGNOSIS — L97211 Non-pressure chronic ulcer of right calf limited to breakdown of skin: Secondary | ICD-10-CM | POA: Diagnosis not present

## 2019-07-24 DIAGNOSIS — I110 Hypertensive heart disease with heart failure: Secondary | ICD-10-CM | POA: Diagnosis not present

## 2019-07-24 DIAGNOSIS — E11319 Type 2 diabetes mellitus with unspecified diabetic retinopathy without macular edema: Secondary | ICD-10-CM | POA: Diagnosis not present

## 2019-07-24 DIAGNOSIS — J449 Chronic obstructive pulmonary disease, unspecified: Secondary | ICD-10-CM | POA: Diagnosis not present

## 2019-07-24 DIAGNOSIS — E11622 Type 2 diabetes mellitus with other skin ulcer: Secondary | ICD-10-CM | POA: Diagnosis not present

## 2019-07-24 DIAGNOSIS — S61202A Unspecified open wound of right middle finger without damage to nail, initial encounter: Secondary | ICD-10-CM | POA: Diagnosis not present

## 2019-07-24 DIAGNOSIS — T5491XA Toxic effect of unspecified corrosive substance, accidental (unintentional), initial encounter: Secondary | ICD-10-CM | POA: Diagnosis not present

## 2019-07-24 DIAGNOSIS — Z86718 Personal history of other venous thrombosis and embolism: Secondary | ICD-10-CM | POA: Diagnosis not present

## 2019-07-24 NOTE — Progress Notes (Signed)
[name], [name] ([id number]) Visit Report for 07/24/2019 Debridement Details Patient Name: [name], [name] Date of Service: 07/24/2019 9:15 AM Medical Record Number: [id number] Patient Account Number: [id number] Date of Birth/Sex: May 18, 1971 (49 y.o. F) Treating RN: Cornell Barman Primary Care Provider: Alma Friendly Other Clinician: Referring Provider: Alma Friendly Treating Provider/Extender: Tito Dine in Treatment: 5 Debridement Performed for Wound #5 Right Hand - 3rd Digit Assessment: Performed By: Physician Ricard Dillon, MD Debridement Type: Debridement Level of Consciousness (Pre- Awake and Alert procedure): Pre-procedure Verification/Time Yes - 09:39 Out Taken: Start Time: 09:39 Pain Control: Lidocaine Total Area Debrided (L x W): 0.3 (cm) x 0.4 (cm) = 0.12 (cm) Tissue and other material Viable, Non-Viable, Slough, Subcutaneous, Slough debrided: Level: Skin/Subcutaneous Tissue Debridement Description: Excisional Instrument: Curette Bleeding: Minimum Hemostasis Achieved: Pressure End Time: 09:39 Response to Treatment: Procedure was tolerated well Level of Consciousness Awake and Alert (Post-procedure): Post Debridement Measurements of Total Wound Length: (cm) 0.3 Width: (cm) 0.4 Depth: (cm) 0.3 Volume: (cm) 0.028 Character of Wound/Ulcer Post Debridement: Stable Post Procedure Diagnosis Same as Pre-procedure Electronic Signature(s) Signed: 07/24/2019 4:15:01 PM By: Linton Ham MD Signed: 07/24/2019 4:52:26 PM By: Gretta Cool, BSN, RN, CWS, Kim RN, BSN Entered By: Linton Ham on 07/24/2019 10:00:41 [name] ([id number]) -------------------------------------------------------------------------------- HPI Details Patient Name: [name] Date of Service: 07/24/2019 9:15 AM Medical Record Number: [id number] Patient Account Number: [id number] Date of Birth/Sex: December 29, 1970 (49 y.o. F) Treating RN: Cornell Barman Primary Care Provider:  Alma Friendly Other Clinician: Referring Provider: Alma Friendly Treating Provider/Extender: Tito Dine in Treatment: 5 History of Present Illness HPI Description: ADMISSION 06/19/2019 Patient is a 49 year old woman who is accompanied by her husband. She has severe diabetic peripheral neuropathy which is left her minimally ambulatory. She spends most of the time in a wheelchair. She also has a history of chronic lower extremity edema history of left leg DVT. She states that on 2 separate occasions roughly a month ago she fell with lacerations on the bilateral anterior tibial areas. She was seen by primary care on 06/04/2019 diagnosed with cellulitis of the left leg put on Bactrim DS. Also noted to have hand wounds with a question of referral to hand surgery. She was also sent to our clinic at the same time. The patient's husband is doing the dressings. He has been using silver alginate that was supplied by home health. He is done a really good job the area on the left leg is actually healed only a small superficial area on the right Somewhere in the in the last 2 weeks she gives a bizarre history of going to wash nail polish off the fingers of both hands. She apparently did not realize that her son had put Clorox in the vinegar jar that they used to clean the countertop. She ended up burning her hands. The patient has a wound on the PIP of the left third finger, PIP of the right second finger and the lateral part of the right third finger Past medical history includes COPD, obstructive sleep apnea on nocturnal O2, type 2 diabetes with peripheral neuropathy, retinopathy, chronic diastolic heart failure coronary artery disease, severe diabetic neuropathy type 2 diabetes apparently diagnosed at age 2 on insulin, COPD, continued tobacco abuse, left leg deep DVT, hypertension, TIAs, seizure disorders, asthma The patient is actually had arterial studies in February of this year.  ABIs bilaterally were 1.02 and TBI's bilaterally at 0.6. Waveforms were triphasic and biphasic. She was not felt to have significant arterial disease.  12/23; patient readmitted the clinic last week. She has predominantly venous insufficiency wounds on the right medial lower leg. She also had open areas on the fingers of both hands which happened in a very bizarre way. She has almost complete healing on the right medial calf. The area on the left third finger is healed. She still has 2 open wounds on the medial aspect of the PIP of the right second finger and the dorsal aspect of the right PIP of the third finger. Of these 2 wounds the PIP of the third finger wound is painful and deeper 12/30; the areas on her right medial calf are closed. She likely has some degree of chronic venous insufficiency and lymphedema. Also very xerotic skin which I have recommended a moisturizing. I did not bring up the issue of compression stockings The areas we are still looking out are on the PIP of the right third finger and the medial part of the PIP of the right second finger. The second finger area is just about closed. The problem is over the PIP of the right third finger. This does not look infected and she is completing her antibiotics from last week. However this is a deep wound at the base of this there is either tendon or periosteum. She she has some healthy granulation but we are not seeing that close over the bottom part of the wound. We are probably going to need to immobilize the finger somewhat so that she does not continuously pull the tissue apart 07/17/2019; right medial calf remains closed and the right second finger is closed. The sole problem here is an area over the PIP of the third finger. These wounds were initially advertised as burn injuries that happened in a very bizarre way [please see my initial discussion on this]. I gave her doxycycline last week because of some erythema around the wound  however she did not tolerate this very well because of nausea. She stopped this within the last day or 2. we have been using silver collagen 1/13; the only wound we are looking at is the right third finger over the PIP. We are using endoform. X-ray ordered last time showed no underlying bone abnormalities [name], [name] Electronic Signature(s) Signed: 07/24/2019 4:15:01 PM By: Linton Ham MD Entered By: Linton Ham on 07/24/2019 10:03:26 [name] -------------------------------------------------------------------------------- Physical Exam Details Patient Name: [name] Date of Service: 07/24/2019 9:15 AM Medical Record Number: [id number] Patient Account Number: [id number] Date of Birth/Sex: 09-Sep-1970 (49 y.o. F) Treating RN: Cornell Barman Primary Care Provider: Alma Friendly Other Clinician: Referring Provider: Alma Friendly Treating Provider/Extender: Tito Dine in Treatment: 5 Notes Wound exam; the only wound we are dealing with is over the dorsal PIP of the right third finger. Once again necrotic debris in the surface of this and subcutaneously debrided with a #3 curette. Although this is not appear as deep as of 1 stated and there is reasonable looking tissue this is still a deep open wound relative to the surface area Electronic Signature(s) Signed: 07/24/2019 4:15:01 PM By: Linton Ham MD Entered By: Linton Ham on 07/24/2019 10:04:17 [name] -------------------------------------------------------------------------------- Physician Orders Details Patient Name: [name] Date of Service: 07/24/2019 9:15 AM Medical Record Number: [id number] Patient Account Number: [id number] Date of Birth/Sex: Jul 16, 1970 (49 y.o. F) Treating RN: Cornell Barman Primary Care Provider: Alma Friendly Other Clinician: Referring Provider: Alma Friendly Treating Provider/Extender: Tito Dine in  Treatment: 5 Verbal / Phone Orders: No Diagnosis  Coding Wound Cleansing Wound #5 Right Hand - 3rd Digit o Clean wound with Normal Saline. o May Shower, gently pat wound dry prior to applying new dressing. Anesthetic (add to Medication List) Wound #5 Right Hand - 3rd Digit o Topical Lidocaine 4% cream applied to wound bed prior to debridement (In Clinic Only). Primary Wound Dressing Wound #5 Right Hand - 3rd Digit o Other: - Endoform moistened with hydrogel Secondary Dressing Wound #5 Right Hand - 3rd Digit o Non-adherent pad - Conform and stretch-net to secure Dressing Change Frequency Wound #5 Right Hand - 3rd Digit o Change Dressing Monday, Wednesday, Friday Follow-up Appointments Wound #5 Right Hand - 3rd Digit o Return Appointment in 1 week. Additional Orders / Instructions Wound #5 Right Hand - 3rd Digit o Other: - Keep finger as straight as possible. Home Health Wound #5 Right Hand - 3rd Digit o Continue Home Health Visits - [place] Nurse may visit PRN to address patientos wound care needs. o FACE TO FACE ENCOUNTER: MEDICARE and MEDICAID PATIENTS: I certify that this patient is under my care and that I had a face-to-face encounter that meets the physician face-to-face encounter requirements with this patient on this date. The encounter with the patient was in whole or in part for the following MEDICAL CONDITION: (primary reason for [place]) MEDICAL NECESSITY: I certify, that based on my findings, NURSING services are a medically necessary home health service. HOME BOUND STATUS: I certify that my clinical findings support that this patient is homebound (i.e., Due to illness or injury, pt requires aid of supportive devices such as crutches, cane, wheelchairs, walkers, the use of special transportation or the assistance of another person to leave their place of residence. There is a normal inability to leave the home [place],  [facility] ([id number]) and doing so requires considerable and taxing effort. Other absences are for medical reasons / religious services and are infrequent or of short duration when for other reasons). o If current dressing causes regression in wound condition, may D/C ordered dressing product/s and apply Normal Saline Moist Dressing daily until next [place] / Other MD appointment. [place] of regression in wound condition at [phone]. o Please direct any NON-WOUND related issues/requests for orders to patient's Primary Care Physician Electronic Signature(s) Signed: 07/24/2019 4:15:01 PM By: Linton Ham MD Signed: 07/24/2019 4:52:26 PM By: Gretta Cool, BSN, RN, CWS, Kim RN, BSN Entered By: Gretta Cool, BSN, RN, CWS, Kim on 07/24/2019 09:41:03 [name] -------------------------------------------------------------------------------- Problem List Details Patient Name: [name] Date of Service: 07/24/2019 9:15 AM Medical Record Number: [id number] Patient Account Number: [id number] Date of Birth/Sex: [birth year] (49 y.o. F) Treating RN: Cornell Barman Primary Care Provider: Alma Friendly Other Clinician: Referring Provider: Alma Friendly Treating Provider/Extender: Tito Dine in Treatment: 5 Active Problems ICD-10 Evaluated Encounter Code Description Active Date Today Diagnosis S61.401D Unspecified open wound of right hand, subsequent encounter 06/19/2019 No Yes E11.622 Type 2 diabetes mellitus with other skin ulcer 06/19/2019 No Yes Inactive Problems ICD-10 Code Description Active Date Inactive Date L97.211 Non-pressure chronic ulcer of right calf limited to breakdown of skin 06/19/2019 06/19/2019 L97.821 Non-pressure chronic ulcer of other part of left lower leg limited to 06/19/2019 06/19/2019 breakdown of skin I89.0 Lymphedema, not elsewhere classified 06/19/2019 06/19/2019 S61.402D Unspecified open wound of left hand,  subsequent encounter 06/19/2019 06/19/2019 Resolved Problems Electronic Signature(s) Signed: 07/24/2019 4:15:01 PM By: Linton Ham MD Entered By: Linton Ham on 07/24/2019 10:00:08 [place], [name] --------------------------------------------------------------------------------  Progress Note Details Patient Name: [name], [name] Date of Service: 07/24/2019 9:15 AM Medical Record Number: [id number] Patient Account Number: [id number] Date of Birth/Sex: 12-04-1970 (49 y.o. F) Treating RN: Cornell Barman Primary Care Provider: Alma Friendly Other Clinician: Referring Provider: Alma Friendly Treating Provider/Extender: Tito Dine in Treatment: 5 Subjective History of Present Illness (HPI) ADMISSION 06/19/2019 Patient is a 49 year old woman who is accompanied by her husband. She has severe diabetic peripheral neuropathy which is left her minimally ambulatory. She spends most of the time in a wheelchair. She also has a history of chronic lower extremity edema history of left leg DVT. She states that on 2 separate occasions roughly a month ago she fell with lacerations on the bilateral anterior tibial areas. She was seen by primary care on 06/04/2019 diagnosed with cellulitis of the left leg put on Bactrim DS. Also noted to have hand wounds with a question of referral to hand surgery. She was also sent to our clinic at the same time. The patient's husband is doing the dressings. He has been using silver alginate that was supplied by home health. He is done a really good job the area on the left leg is actually healed only a small superficial area on the right Somewhere in the in the last 2 weeks she gives a bizarre history of going to wash nail polish off the fingers of both hands. She apparently did not realize that her son had put Clorox in the vinegar jar that they used to clean the countertop. She ended up burning her hands. The patient has a wound on the PIP of the  left third finger, PIP of the right second finger and the lateral part of the right third finger Past medical history includes COPD, obstructive sleep apnea on nocturnal O2, type 2 diabetes with peripheral neuropathy, retinopathy, chronic diastolic heart failure coronary artery disease, severe diabetic neuropathy type 2 diabetes apparently diagnosed at age 13 on insulin, COPD, continued tobacco abuse, left leg deep DVT, hypertension, TIAs, seizure disorders, asthma The patient is actually had arterial studies in February of this year. ABIs bilaterally were 1.02 and TBI's bilaterally at 0.6. Waveforms were triphasic and biphasic. She was not felt to have significant arterial disease. 12/23; patient readmitted the clinic last week. She has predominantly venous insufficiency wounds on the right medial lower leg. She also had open areas on the fingers of both hands which happened in a very bizarre way. She has almost complete healing on the right medial calf. The area on the left third finger is healed. She still has 2 open wounds on the medial aspect of the PIP of the right second finger and the dorsal aspect of the right PIP of the third finger. Of these 2 wounds the PIP of the third finger wound is painful and deeper 12/30; the areas on her right medial calf are closed. She likely has some degree of chronic venous insufficiency and lymphedema. Also very xerotic skin which I have recommended a moisturizing. I did not bring up the issue of compression stockings The areas we are still looking out are on the PIP of the right third finger and the medial part of the PIP of the right second finger. The second finger area is just about closed. The problem is over the PIP of the right third finger. This does not look infected and she is completing her antibiotics from last week. However this is a deep wound at the base of this there is either  tendon or periosteum. She she has some healthy granulation but we  are not seeing that close over the bottom part of the wound. We are probably going to need to immobilize the finger somewhat so that she does not continuously pull the tissue apart 07/17/2019; right medial calf remains closed and the right second finger is closed. The sole problem here is an area over the PIP of the third finger. These wounds were initially advertised as burn injuries that happened in a very bizarre way [please see my initial discussion on this]. I gave her doxycycline last week because of some erythema around the wound however she did not tolerate this very well because of nausea. She stopped this within the last day or 2. we have been using silver collagen 1/13; the only wound we are looking at is the right third finger over the PIP. We are using endoform. X-ray ordered last time showed no underlying bone abnormalities [name], [name] Objective Constitutional Vitals Time Taken: 9:08 AM, Height: 64 in, Weight: 286 lbs, BMI: 49.1, Temperature: 98.1 F, Pulse: 81 bpm, Respiratory Rate: 16 breaths/min, Blood Pressure: 117/55 mmHg. Integumentary (Hair, Skin) Wound #5 status is Open. Original cause of wound was Trauma. The wound is located on the Right Hand - 3rd Digit. The wound measures 0.3cm length x 0.4cm width x 0.3cm depth; 0.094cm^2 area and 0.028cm^3 volume. There is Fat Layer (Subcutaneous Tissue) Exposed exposed. There is no tunneling or undermining noted. There is a medium amount of sanguinous drainage noted. The wound margin is thickened. There is no granulation within the wound bed. There is a large (67-100%) amount of necrotic tissue within the wound bed including Adherent Slough. Assessment Active Problems ICD-10 Unspecified open wound of right hand, subsequent encounter Type 2 diabetes mellitus with other skin ulcer Procedures Wound #5 Pre-procedure diagnosis of Wound #5 is a Trauma, Other located on the Right Hand - 3rd Digit . There was a  Excisional Skin/Subcutaneous Tissue Debridement with a total area of 0.12 sq cm performed by Ricard Dillon, MD. With the following instrument(s): Curette to remove Viable and Non-Viable tissue/material. Material removed includes Subcutaneous Tissue and Slough and after achieving pain control using Lidocaine. No specimens were taken. A time out was conducted at 09:39, prior to the start of the procedure. A Minimum amount of bleeding was controlled with Pressure. The procedure was tolerated well. Post Debridement Measurements: 0.3cm length x 0.4cm width x 0.3cm depth; 0.028cm^3 volume. Character of Wound/Ulcer Post Debridement is stable. Post procedure Diagnosis Wound #5: Same as Pre-Procedure Plan [name], [name] Wound Cleansing: Wound #5 Right Hand - 3rd Digit: Clean wound with Normal Saline. May Shower, gently pat wound dry prior to applying new dressing. Anesthetic (add to Medication List): Wound #5 Right Hand - 3rd Digit: Topical Lidocaine 4% cream applied to wound bed prior to debridement (In Clinic Only). Primary Wound Dressing: Wound #5 Right Hand - 3rd Digit: Other: - Endoform moistened with hydrogel Secondary Dressing: Wound #5 Right Hand - 3rd Digit: Non-adherent pad - Conform and stretch-net to secure Dressing Change Frequency: Wound #5 Right Hand - 3rd Digit: Change Dressing Monday, Wednesday, Friday Follow-up Appointments: Wound #5 Right Hand - 3rd Digit: Return Appointment in 1 week. Additional Orders / Instructions: Wound #5 Right Hand - 3rd Digit: Other: - Keep finger as straight as possible. Home Health: Wound #5 Right Hand - 3rd Digit: [place] Nurse may visit PRN to address patient s wound care  needs. FACE TO FACE ENCOUNTER: MEDICARE and MEDICAID PATIENTS: I certify that this patient is under my care and that I had a face-to-face encounter that meets the physician face-to-face encounter  requirements with this patient on this date. The encounter with the patient was in whole or in part for the following MEDICAL CONDITION: (primary reason for [place]) MEDICAL NECESSITY: I certify, that based on my findings, NURSING services are a medically necessary home health service. HOME BOUND STATUS: I certify that my clinical findings support that this patient is homebound (i.e., Due to illness or injury, pt requires aid of supportive devices such as crutches, cane, wheelchairs, walkers, the use of special transportation or the assistance of another person to leave their place of residence. There is a normal inability to leave the home and doing so requires considerable and taxing effort. Other absences are for medical reasons / religious services and are infrequent or of short duration when for other reasons). If current dressing causes regression in wound condition, may D/C ordered dressing product/s and apply Normal Saline Moist Dressing daily until next [place] / Other MD appointment. [place] of regression in wound condition at [phone]. Please direct any NON-WOUND related issues/requests for orders to patient's Primary Care Physician 1. I am continuing with endoform 2. I have asked her to keep the finger relatively extended to avoid pulling the tissue apart. So far we have not managed to get her to do this. Electronic Signature(s) Signed: 07/24/2019 4:15:01 PM By: Linton Ham MD Entered By: Linton Ham on 07/24/2019 10:05:04 [name], [name] ([id number]) -------------------------------------------------------------------------------- SuperBill Details Patient Name: [name] Date of Service: 07/24/2019 Medical Record Number: [id number] Patient Account Number: [id number] Date of Birth/Sex: [birth year] (49 y.o. F) Treating RN: Cornell Barman Primary Care Provider: Alma Friendly Other Clinician: Referring Provider: Alma Friendly Treating Provider/Extender: Tito Dine in Treatment: 5 Diagnosis Coding ICD-10 Codes Code Description [id number] Unspecified open wound of right hand, subsequent encounter E11.622 Type 2 diabetes mellitus with other skin ulcer Facility Procedures CPT4 Code: [id number] Description: [id number] - DEB SUBQ TISSUE 20 SQ CM/< ICD-10 Diagnosis Description S61.401D Unspecified open wound of right hand, subsequent encou Modifier: nter Quantity: 1 Physician Procedures CPT4 Code: [id number] Description: [id number] - WC PHYS SUBQ TISS 20 SQ CM ICD-10 Diagnosis Description S61.401D Unspecified open wound of right hand, subsequent encou Modifier: nter Quantity: 1 Electronic Signature(s) Signed: 07/24/2019 4:15:01 PM By: Linton Ham MD Entered By: Linton Ham on 07/24/2019 10:05:21

## 2019-07-24 NOTE — Progress Notes (Signed)
Nancy, Foster ([id number]) Visit Report for 07/24/2019 Arrival Information Details Patient Name: Nancy, Foster Date of Service: 07/24/2019 9:15 AM Medical Record Number: [id number] Patient Account Number: [id number] Date of Birth/Sex: 1970-10-25 (49 y.o. F) Treating RN: Cornell Barman Primary Care Markelle Asaro: Alma Friendly Other Clinician: Referring Hristopher Missildine: Alma Friendly Treating Kenzie Thoreson/Extender: Tito Dine in Treatment: 5 Visit Information History Since Last Visit Added or deleted any medications: No Patient Arrived: Wheel Chair Any new allergies or adverse reactions: No Arrival Time: 09:07 Had a fall or experienced change in No Accompanied By: husband activities of daily living that may affect Transfer Assistance: Manual risk of falls: Patient Identification Verified: Yes Signs or symptoms of abuse/neglect since last visito No Secondary Verification Process Yes Hospitalized since last visit: No Completed: Implantable device outside of the clinic excluding No Patient Has Alerts: Yes cellular tissue based products placed in the center Patient Alerts: ABI L/R 1.02 TBI L/R since last visit: 0.6 Has Dressing in Place as Prescribed: Yes Pain Present Now: No Electronic Signature(s) Signed: 07/24/2019 12:09:48 PM By: Lorine Bears RCP, RRT, CHT Entered By: Lorine Bears on 07/24/2019 09:08:41 [place], [name] -------------------------------------------------------------------------------- Encounter Discharge Information Details Patient Name: [name] Date of Service: 07/24/2019 9:15 AM Medical Record Number: [id number] Patient Account Number: [id number] Date of Birth/Sex: [birth year] (49 y.o. F) Treating RN: Cornell Barman Primary Care Dekker Verga: Alma Friendly Other Clinician: Referring Jaclynn Laumann: Alma Friendly Treating Alixandrea Milleson/Extender: Tito Dine in Treatment: 5 Encounter Discharge Information Items  Post Procedure Vitals Discharge Condition: Stable Temperature (F): 98.1 Ambulatory Status: Wheelchair Pulse (bpm): 81 Discharge Destination: Home Respiratory Rate (breaths/min): 16 Transportation: Private Auto Blood Pressure (mmHg): 117/55 Accompanied By: husband Schedule Follow-up Appointment: Yes Clinical Summary of Care: Electronic Signature(s) Signed: 07/24/2019 4:52:26 PM By: Gretta Cool, BSN, RN, CWS, Kim RN, BSN Entered By: Gretta Cool, BSN, RN, CWS, Kim on 07/24/2019 09:42:56 [name] -------------------------------------------------------------------------------- Lower Extremity Assessment Details Patient Name: [name] Date of Service: 07/24/2019 9:15 AM Medical Record Number: [id number] Patient Account Number: [id number] Date of Birth/Sex: [birth year] (49 y.o. F) Treating RN: Army Melia Primary Care Ladasia Sircy: Alma Friendly Other Clinician: Referring Ayana Imhof: Alma Friendly Treating Rut Betterton/Extender: Ricard Dillon Weeks in Treatment: 5 Electronic Signature(s) Signed: 07/24/2019 3:13:50 PM By: Army Melia Entered By: Army Melia on 07/24/2019 09:19:03 [place], [name] -------------------------------------------------------------------------------- Multi Wound Chart Details Patient Name: [name] Date of Service: 07/24/2019 9:15 AM Medical Record Number: [id number] Patient Account Number: [id number] Date of Birth/Sex: 01/09/1971 (49 y.o. F) Treating RN: Cornell Barman Primary Care Rafe Mackowski: Alma Friendly Other Clinician: Referring Finlee Milo: Alma Friendly Treating Cahlil Sattar/Extender: Tito Dine in Treatment: 5 Vital Signs Height(in): 64 Pulse(bpm): 74 Weight(lbs): 286 Blood Pressure(mmHg): 117/55 Body Mass Index(BMI): 49 Temperature(F): 98.1 Respiratory Rate 16 (breaths/min): Photos: [N/A:N/A] Wound Location: Right Hand - 3rd Digit N/A N/A Wounding Event: Trauma N/A N/A Primary Etiology: Trauma, Other N/A  N/A Comorbid History: Asthma, Chronic Obstructive N/A N/A Pulmonary Disease (COPD), Sleep Apnea, Arrhythmia, Congestive Heart Failure, Coronary Artery Disease, Hypertension, Type II Diabetes Date Acquired: 05/20/2019 N/A N/A Weeks of Treatment: 5 N/A N/A Wound Status: Open N/A N/A Measurements L x W x D 0.3x0.4x0.3 N/A N/A (cm) Area (cm) : 0.094 N/A N/A Volume (cm) : 0.028 N/A N/A % Reduction in Area: 83.40% N/A N/A % Reduction in Volume: 75.20% N/A N/A Classification: Full Thickness Without N/A N/A Exposed Support Structures Exudate Amount: Medium N/A N/A Exudate Type: Sanguinous N/A N/A Exudate Color: red N/A N/A Wound Margin: Thickened N/A N/A Granulation  Amount: None Present (0%) N/A N/A Necrotic Amount: Large (67-100%) N/A N/A Exposed Structures: Fat Layer (Subcutaneous N/A N/A Tissue) Exposed: Yes Fascia: No Foster, [name] ([id number]) Tendon: No Muscle: No Joint: No Bone: No Epithelialization: None N/A N/A Debridement: Debridement - Excisional N/A N/A Pre-procedure 09:39 N/A N/A Verification/Time Out Taken: Pain Control: Lidocaine N/A N/A Tissue Debrided: Subcutaneous, Slough N/A N/A Level: Skin/Subcutaneous Tissue N/A N/A Debridement Area (sq cm): 0.12 N/A N/A Instrument: Curette N/A N/A Bleeding: Minimum N/A N/A Hemostasis Achieved: Pressure N/A N/A Debridement Treatment Procedure was tolerated well N/A N/A Response: Post Debridement 0.3x0.4x0.3 N/A N/A Measurements L x W x D (cm) Post Debridement Volume: 0.028 N/A N/A (cm) Procedures Performed: Debridement N/A N/A Treatment Notes Wound #5 (Right Hand - 3rd Digit) Notes Endoform, non-adherent pad, conform - Electronic Signature(s) Signed: 07/24/2019 4:15:01 PM By: Linton Ham MD Entered By: Linton Ham on 07/24/2019 10:00:28 [name] ([id number]) -------------------------------------------------------------------------------- Multi-Disciplinary Care Plan Details Patient Name: [name] Date of Service: 07/24/2019 9:15 AM Medical Record Number: [id number] Patient Account Number: [id number] Date of Birth/Sex: June 06, 1971 (49 y.o. F) Treating RN: Cornell Barman Primary Care Esteven Overfelt: Alma Friendly Other Clinician: Referring Elodie Panameno: Alma Friendly Treating Posey Jasmin/Extender: Tito Dine in Treatment: 5 Active Inactive Abuse / Safety / Falls / Self Care Management Nursing Diagnoses: History of Falls Goals: Patient will not develop complications from immobility Date Initiated: 06/19/2019 Target Resolution Date: 07/10/2019 Goal Status: Active Patient will remain injury free related to falls Date Initiated: 06/19/2019 Target Resolution Date: 07/10/2019 Goal Status: Active Interventions: Assess fall risk on admission and as needed Treatment Activities: Patient referred to home care : 06/19/2019 Notes: Orientation to the Wound Care Program Nursing Diagnoses: Knowledge deficit related to the wound healing center program Goals: Patient/caregiver will verbalize understanding of the [facility] Date Initiated: 06/19/2019 Target Resolution Date: 07/10/2019 Goal Status: Active Interventions: Provide education on orientation to the wound center Notes: Peripheral Neuropathy Nursing Diagnoses: Knowledge deficit related to disease process and management of peripheral neurovascular dysfunction Potential alteration in peripheral tissue perfusion (select prior to confirmation of diagnosis) [place], [name] ([id number]) Goals: Patient/caregiver will verbalize understanding of disease process and disease management Date Initiated: 06/19/2019 Target Resolution Date: 07/10/2019 Goal Status: Active Interventions: Assess signs and symptoms of neuropathy upon admission and as needed Treatment Activities: Patient referred for customized footwear/offloading : 06/19/2019 Notes: Wound/Skin Impairment Nursing Diagnoses: Impaired tissue  integrity Goals: Patient/caregiver will verbalize understanding of skin care regimen Date Initiated: 06/19/2019 Target Resolution Date: 07/10/2019 Goal Status: Active Ulcer/skin breakdown will have a volume reduction of 30% by week 4 Date Initiated: 06/19/2019 Target Resolution Date: 07/20/2019 Goal Status: Active Interventions: Assess patient/caregiver ability to obtain necessary supplies Assess ulceration(s) every visit Treatment Activities: Skin care regimen initiated : 06/19/2019 Topical wound management initiated : 06/19/2019 Notes: Electronic Signature(s) Signed: 07/24/2019 4:52:26 PM By: Gretta Cool, BSN, RN, CWS, Kim RN, BSN Entered By: Gretta Cool, BSN, RN, CWS, Kim on 07/24/2019 09:38:42 [place], [name] ([id number]) -------------------------------------------------------------------------------- Pain Assessment Details Patient Name: [name] Date of Service: 07/24/2019 9:15 AM Medical Record Number: [id number] Patient Account Number: [id number] Date of Birth/Sex: 04/17/1971 (49 y.o. F) Treating RN: Cornell Barman Primary Care Jevin Camino: Alma Friendly Other Clinician: Referring Bay Wayson: Alma Friendly Treating Elita Dame/Extender: Tito Dine in Treatment: 5 Active Problems Location of Pain Severity and Description of Pain Patient Has Paino No Site Locations Pain Management and Medication Current Pain Management: Electronic Signature(s) Signed: 07/24/2019 12:09:48 PM By: Lorine Bears RCP, RRT, CHT Signed: 07/24/2019 4:52:26 PM  By: Gretta Cool, BSN, RN, CWS, Kim RN, BSN Entered By: Lorine Bears on 07/24/2019 09:08:49 GARA, WAHLS ([id number]) -------------------------------------------------------------------------------- Patient/Caregiver Education Details Patient Name: [name] Date of Service: 07/24/2019 9:15 AM Medical Record Number: [id number] Patient Account Number: [id number] Date of Birth/Gender: 01-31-1971 (49 y.o. F) Treating RN:  Cornell Barman Primary Care Physician: Alma Friendly Other Clinician: Referring Physician: Alma Friendly Treating Physician/Extender: Tito Dine in Treatment: 5 Education Assessment Education Provided To: Patient Education Topics Provided Wound/Skin Impairment: Handouts: Caring for Your Ulcer Methods: Demonstration, Explain/Verbal Responses: State content correctly Electronic Signature(s) Signed: 07/24/2019 4:52:26 PM By: Gretta Cool, BSN, RN, CWS, Kim RN, BSN Entered By: Gretta Cool, BSN, RN, CWS, Kim on 07/24/2019 09:41:56 [name] -------------------------------------------------------------------------------- Wound Assessment Details Patient Name: [name] Date of Service: 07/24/2019 9:15 AM Medical Record Number: [id number] Patient Account Number: [id number] Date of Birth/Sex: [birth year] (49 y.o. F) Treating RN: Army Melia Primary Care Drenda Sobecki: Alma Friendly Other Clinician: Referring Ashanti Littles: Alma Friendly Treating Kinzy Weyers/Extender: Tito Dine in Treatment: 5 Wound Status Wound Number: 5 Primary Trauma, Other Etiology: Wound Location: Right Hand - 3rd Digit Wound Open Wounding Event: Trauma Status: Date Acquired: 05/20/2019 Comorbid Asthma, Chronic Obstructive Pulmonary Disease Weeks Of Treatment: 5 History: (COPD), Sleep Apnea, Arrhythmia, Congestive Clustered Wound: No Heart Failure, Coronary Artery Disease, Hypertension, Type II Diabetes Photos Wound Measurements Length: (cm) 0.3 Width: (cm) 0.4 Depth: (cm) 0.3 Area: (cm) 0.094 Volume: (cm) 0.028 % Reduction in Area: 83.4% % Reduction in Volume: 75.2% Epithelialization: None Tunneling: No Undermining: No Wound Description Full Thickness Without Exposed Support Classification: Structures Wound Margin: Thickened Exudate Medium Amount: Exudate Type: Sanguinous Exudate Color: red Foul Odor After Cleansing: No Slough/Fibrino Yes Wound  Bed Granulation Amount: None Present (0%) Exposed Structure Necrotic Amount: Large (67-100%) Fascia Exposed: No Necrotic Quality: Adherent Slough Fat Layer (Subcutaneous Tissue) Exposed: Yes Tendon Exposed: No Muscle Exposed: No Joint Exposed: No Bone Exposed: No [name], [name] Treatment Notes Wound #5 (Right Hand - 3rd Digit) Notes Endoform, non-adherent pad, conform - Electronic Signature(s) Signed: 07/24/2019 3:13:50 PM By: Army Melia Entered By: Army Melia on 07/24/2019 09:18:52 [place], [name] -------------------------------------------------------------------------------- Hasty Details Patient Name: [name] Date of Service: 07/24/2019 9:15 AM Medical Record Number: [id number] Patient Account Number: [id number] Date of Birth/Sex: [birth year] (49 y.o. F) Treating RN: Cornell Barman Primary Care Seville Brick: Alma Friendly Other Clinician: Referring Brieanna Nau: Alma Friendly Treating Lillyann Ahart/Extender: Tito Dine in Treatment: 5 Vital Signs Time Taken: 09:08 Temperature (F): 98.1 Height (in): 64 Pulse (bpm): 81 Weight (lbs): 286 Respiratory Rate (breaths/min): 16 Body Mass Index (BMI): 49.1 Blood Pressure (mmHg): 117/55 Reference Range: 80 - 120 mg / dl Electronic Signature(s) Signed: 07/24/2019 12:09:48 PM By: Lorine Bears RCP, RRT, CHT Entered By: Lorine Bears on 07/24/2019 09:12:01

## 2019-07-25 ENCOUNTER — Telehealth: Payer: Self-pay

## 2019-07-25 ENCOUNTER — Emergency Department
Admission: EM | Admit: 2019-07-25 | Discharge: 2019-07-25 | Discharge status: Against Medical Advice | Payer: Medicare Other

## 2019-07-25 ENCOUNTER — Telehealth: Payer: Self-pay | Admitting: Internal Medicine

## 2019-07-25 NOTE — ED Notes (Signed)
Pt refuses to be triaged, stating that she is not going to stay if her SO can't be with her; attempted to explain visitor policy to pt but pt is upset, yelling and st that she is leaving

## 2019-07-25 NOTE — Telephone Encounter (Signed)
Solum, Nancy Evener, MD  [facility] [place], [place] [address]  [phone]  [phone] (Fax)   1.  appt 06/24/19 missed appt [facility] endocrine not yet established  another appt sch 08/29/19  cbgs today 500s 07/25/19 and yesterday 600s 07/24/19 after spagetti Pt reports took 500 units of insulin? Normal dose is 250 units   2. Having vaginal bleeding after no cycle for 16-17 years   See above  Called by palliative care RN [name] with above details   Plan 1. rec go to ED for cbgs in the 500s  2. Will need ob/gyn f/u referral by PCP  3. Will need f/u with PCP after ED and may PCP can move [facility] endocrine appt up sooner than 08/29/19

## 2019-07-25 NOTE — Telephone Encounter (Signed)
Olivia Mackie nurse with [facility] left v/m;Starting on 07/24/19 pt started with vaginal spotting; today is bleeding heavier and has changed pull ups x 3 today. Pt said there is a lot of bright red blood in pullup; no blood clots. Pt has abd cramping like menstrual period but pt has not had menstrual period in 16 years. Pt is dizzy every time she stands up; no sexual intercourse; also 07/24/19 BS > than 600 last night. BS would not register today.  Pt took 500 units of U500 insulin last night and again this morning and at 2:15PM today BS was 500. Pt said she usually takes 300 units daily of U 500. I tried calling Olivia Mackie back and got v/m. I tried calling pt and got recording v/m not set up. I called [phone] and spoke with Tammy a nurse with [facility] and advised if pt was bleeding heavy vaginally with no menstrual period in 16 yrs plus BS > 600 pt should go to ED or at least contact Endo. Tammy voiced understanding and would have Olivia Mackie contact pt. Gentry Fitz NP out of office and I spoke with Dr Damita Dunnings who advised either Endo or ED and if pt was on ASA or blood thinner if continued to bleed should stop ASA or blood thinner and cb on 07/26/19 as update of pt condition. I called [facility] and got answering service and I gave this message to answering service. I then tried pt again and pt answered phone. Pt verified information given at top of this note.pt notified of Dr Josefine Class instructions and voiced understanding. Pt is very concerned about the vaginal bleeding and pt's husband will take her to [facility] ED as soon as her husband gets home which should be soon. Advised pt if bleeding increased before pts husband got home to call 911 but pt said he would be home soon and she would go to [facility] ED to get vaginal bleeding checked as well as high BS. Pt will call [place] on 07/26/19 with update. FYI to Dr Damita Dunnings and Gentry Fitz NP.

## 2019-07-26 ENCOUNTER — Ambulatory Visit (INDEPENDENT_AMBULATORY_CARE_PROVIDER_SITE_OTHER): Payer: BC Managed Care – PPO | Admitting: Family Medicine

## 2019-07-26 ENCOUNTER — Other Ambulatory Visit: Payer: Self-pay

## 2019-07-26 ENCOUNTER — Telehealth: Payer: Self-pay | Admitting: Endocrinology

## 2019-07-26 ENCOUNTER — Encounter: Payer: Self-pay | Admitting: Family Medicine

## 2019-07-26 VITALS — BP 90/60 | HR 88 | Temp 97.7°F | Ht 64.0 in | Wt 272.5 lb

## 2019-07-26 DIAGNOSIS — I6521 Occlusion and stenosis of right carotid artery: Secondary | ICD-10-CM

## 2019-07-26 DIAGNOSIS — F418 Other specified anxiety disorders: Secondary | ICD-10-CM

## 2019-07-26 DIAGNOSIS — N950 Postmenopausal bleeding: Secondary | ICD-10-CM | POA: Diagnosis not present

## 2019-07-26 DIAGNOSIS — R319 Hematuria, unspecified: Secondary | ICD-10-CM

## 2019-07-26 DIAGNOSIS — E1065 Type 1 diabetes mellitus with hyperglycemia: Secondary | ICD-10-CM

## 2019-07-26 DIAGNOSIS — E1165 Type 2 diabetes mellitus with hyperglycemia: Secondary | ICD-10-CM

## 2019-07-26 LAB — CBC WITH DIFFERENTIAL/PLATELET
Basophils Absolute: 0.1 10*3/uL (ref 0.0–0.1)
Basophils Relative: 0.7 % (ref 0.0–3.0)
Eosinophils Absolute: 0.1 10*3/uL (ref 0.0–0.7)
Eosinophils Relative: 1.3 % (ref 0.0–5.0)
HCT: 42.8 % (ref 36.0–46.0)
Hemoglobin: 14.6 g/dL (ref 12.0–15.0)
Lymphocytes Relative: 19 % (ref 12.0–46.0)
Lymphs Abs: 1.8 10*3/uL (ref 0.7–4.0)
MCHC: 34 g/dL (ref 30.0–36.0)
MCV: 87.8 fl (ref 78.0–100.0)
Monocytes Absolute: 0.7 10*3/uL (ref 0.1–1.0)
Monocytes Relative: 7.3 % (ref 3.0–12.0)
Neutro Abs: 6.6 10*3/uL (ref 1.4–7.7)
Neutrophils Relative %: 71.7 % (ref 43.0–77.0)
Platelets: 204 10*3/uL (ref 150.0–400.0)
RBC: 4.88 Mil/uL (ref 3.87–5.11)
RDW: 16.4 % — ABNORMAL HIGH (ref 11.5–15.5)
WBC: 9.3 10*3/uL (ref 4.0–10.5)

## 2019-07-26 LAB — BASIC METABOLIC PANEL
BUN: 54 mg/dL — ABNORMAL HIGH (ref 6–23)
CO2: 33 mEq/L — ABNORMAL HIGH (ref 19–32)
Calcium: 10.2 mg/dL (ref 8.4–10.5)
Chloride: 82 mEq/L — ABNORMAL LOW (ref 96–112)
Creatinine, Ser: 1.81 mg/dL — ABNORMAL HIGH (ref 0.40–1.20)
GFR: 29.53 mL/min — ABNORMAL LOW (ref >60.00)
Glucose, Bld: 441 mg/dL — ABNORMAL HIGH (ref 70–99)
Potassium: 3.9 mEq/L (ref 3.5–5.1)
Sodium: 127 mEq/L — ABNORMAL LOW (ref 135–145)

## 2019-07-26 LAB — POC URINALSYSI DIPSTICK (AUTOMATED)
Bilirubin, UA: NEGATIVE
Glucose, UA: POSITIVE — AB
Ketones, UA: NEGATIVE
Leukocytes, UA: NEGATIVE
Nitrite, UA: NEGATIVE
Protein, UA: POSITIVE — AB
Spec Grav, UA: 1.015 (ref 1.010–1.025)
Urobilinogen, UA: 0.2 E.U./dL
pH, UA: 6 (ref 5.0–8.0)

## 2019-07-26 MED ORDER — HYDROXYZINE HCL 10 MG PO TABS: 10.0000 mg | ORAL_TABLET | Freq: Two times a day (BID) | ORAL | 0 refills | Status: DC | PRN

## 2019-07-26 NOTE — Patient Instructions (Signed)
Can use atarax as needed for anxiety. I have sent a message to Dr. Loanne Drilling for input on how to treat hyperglycemia.  Please stop at the lab to have labs drawn.  We will call to set up Ultrasound and gynecology referral.  If heavy bleeding restarts how aspirin and go to ER.

## 2019-07-26 NOTE — Progress Notes (Signed)
Chief Complaint  Patient presents with  . Vaginal Bleeding  . Hyperglycemia    History of Present Illness: HPI   49 year old  postmenopausal female patient of Tawni Millers  With history of type 1 DM,  On hospice for CHF presents with new onset  vaginal bleeding in last 24 hours.  Last menses 16 years ago.  She noted on pull ups that there was blood.Marland Kitchen also in toilet.. saw some of blood as urinating. Water  toilet was pink.  Blood has decrease since  Now just drips on pullup.  No pain with urination. She noted slight vaginal soreness.  No rash in groin.   no D/C, n/V, no abdominal pain   She also has very poorly controlled diabetes with  Recent CBGs in 600s, although now after taking insulin CBGs dropped to 470 this AM..  She has contacted her ENDO Dr.  Loanne Drilling.  Took 370 units of insulin.   She reports new onset vaginal bleeding  She went to ER on 07/24/2018 but left prior to being seen.  She ia on ASA given hx of TIA, CVA  Last Pap 2014  Hg on 07/18/2019 15 ( high given she is a smoker)  UA today showed glucose, large blood and no nitrate or wbc  Has history of MRSA exposure   She had a bad experience at ER.. left with panic attack.. she requests something for anxiety.  No anxiety before then per pt.. but she is on max buspar.  This visit occurred during the SARS-CoV-2 public health emergency.  Safety protocols were in place, including screening questions prior to the visit, additional usage of staff PPE, and extensive cleaning of exam room while observing appropriate contact time as indicated for disinfecting solutions.   COVID 19 screen:  No recent travel or known exposure to COVID19 The patient denies respiratory symptoms of COVID 19 at this time. The importance of social distancing was discussed today.     Review of Systems  Constitutional: Negative for chills and fever.  HENT: Negative for congestion and ear pain.   Eyes: Negative for pain and redness.   Respiratory: Negative for cough and shortness of breath.   Cardiovascular: Negative for chest pain, palpitations and leg swelling.  Gastrointestinal: Negative for abdominal pain, blood in stool, constipation, diarrhea, nausea and vomiting.  Genitourinary: Negative for dysuria.  Musculoskeletal: Negative for falls and myalgias.  Skin: Negative for rash.  Neurological: Negative for dizziness.  Psychiatric/Behavioral: The patient is nervous/anxious.       Past Medical History:  Diagnosis Date  . Anxiety    takes Prozac daily  . Anxiety   . Aortic valve calcification   . Asthma    Advair and Spirva daily  . Asthma   . Bipolar disorder ([place])   . CAD (coronary artery disease)    a. LHC 11/2013 done for CP/fluid retention: mild disease in prox LAD, mild-mod disease in mRCA, EF 60% with normal LVEDP. b. Normal nuc 03/2016.  Marland Kitchen CHF (congestive heart failure) ([place])   . Chronic diastolic CHF (congestive heart failure) ([place])   . CKD (chronic kidney disease), stage II   . COPD (chronic obstructive pulmonary disease) (H[facility])    a. nocturnal O2.  Marland Kitchen COPD (chronic obstructive pulmonary disease) ([place])   . Coronary artery disease   . Decreased urine stream   . Diabetes mellitus   . Diabetes mellitus without complication ([place])   . Dyspnea   . Family history of adverse reaction to anesthesia  mom gets nauseated  . GERD (gastroesophageal reflux disease)    takes Pepcid daily  . History of blood clots    left leg 3-26yrs ago  . Hyperlipidemia   . Hypertension   . Hypertriglyceridemia   . Inguinal hernia, left 01/2015  . Muscle spasm   . Open wound of genital labia   . Peripheral neuropathy   . RBBB   . Seizures ([place])   . Sepsis ([place]) 01/19/2018  . Sinus tachycardia    a. persistent since 2009.  Marland Kitchen Smokers' cough ([place])   . Stroke [facility]) 1989   left sided weakness  . TIA (transient ischemic attack)   . Tobacco abuse   . Vulvar abscess 01/23/2018    reports that she has been smoking  cigarettes. She has a 18.00 pack-year smoking history. She has never used smokeless tobacco. She reports that she does not drink alcohol or use drugs.   Current Outpatient Medications:  .  acetaminophen (TYLENOL) 500 MG tablet, Take 500 mg by mouth every 4 (four) hours as needed for moderate pain. , Disp: , Rfl:  .  ALPRAZolam (XANAX) 0.5 MG tablet, Take 1 tablet (0.5 mg total) by mouth 2 (two) times daily as needed for anxiety. (Patient taking differently: Take 1 mg by mouth daily. ), Disp: , Rfl: 0 .  aspirin EC 81 MG tablet, Take 81 mg by mouth every morning., Disp: , Rfl:  .  budesonide (PULMICORT) 0.5 MG/2ML nebulizer solution, Take 0.5 mg by nebulization 2 (two) times daily., Disp: , Rfl:  .  budesonide-formoterol (SYMBICORT) 80-4.5 MCG/ACT inhaler, INHALE 2 PUFFS BY MOUTH EVERY 12 HOURS TO PREVENT COUGH OR WHEEZING *RINSE MOUTH AFTER EACH USE*, Disp: 10.2 g, Rfl: 6 .  busPIRone (BUSPAR) 30 MG tablet, Take 30 mg by mouth 2 (two) times daily., Disp: , Rfl:  .  doxycycline (MONODOX) 50 MG capsule, Take 50 mg by mouth 2 (two) times daily., Disp: , Rfl:  .  famotidine (PEPCID) 20 MG tablet, TAKE 1 TABLET (20 MG TOTAL) BY MOUTH 2 (TWO) TIMES DAILY. FOR HEARTBURN., Disp: 180 tablet, Rfl: 1 .  FLUoxetine (PROZAC) 40 MG capsule, Take 40 mg by mouth at bedtime. , Disp: , Rfl:  .  glucose blood (ONETOUCH VERIO) test strip, Use to monitor glucose levels three times daily; E11.9; E10.42, Disp: 100 each, Rfl: 12 .  Insulin Pen Needle 32G X 4 MM MISC, Used to give insulin injections twice daily., Disp: 100 each, Rfl: 11 .  insulin regular human CONCENTRATED (HUMULIN R U-500 KWIKPEN) 500 UNIT/ML kwikpen, Inject 250 Units into the skin 2 (two) times a day. (Patient taking differently: Inject 180-230 Units into the skin 2 (two) times a day. Sliding scale), Disp: 14 pen, Rfl: 11 .  ipratropium-albuterol (DUONEB) 0.5-2.5 (3) MG/3ML SOLN, Take 3 mLs by nebulization every 4 (four) hours as needed (wheezing/shortness  of breath)., Disp: 360 mL, Rfl: 0 .  loperamide (IMODIUM A-D) 2 MG tablet, Take 4 mg by mouth 3 (three) times daily as needed for diarrhea or loose stools., Disp: , Rfl:  .  metolazone (ZAROXOLYN) 2.5 MG tablet, Take 2.5 mg by mouth at bedtime., Disp: , Rfl:  .  ondansetron (ZOFRAN ODT) 4 MG disintegrating tablet, Take 1 tablet (4 mg total) by mouth every 8 (eight) hours as needed for nausea or vomiting., Disp: 15 tablet, Rfl: 0 .  OneTouch Delica Lancets 99991111 MISC, 1 each by Does not apply route 3 (three) times daily. Use to monitor glucose levels three  times daily; E11.9; E10.42, Disp: 100 each, Rfl: 11 .  OXYGEN, Inhale 4 L into the lungs at bedtime. , Disp: , Rfl:  .  potassium chloride 20 MEQ TBCR, Take 20 mEq by mouth daily., Disp: 60 tablet, Rfl: 2 .  potassium chloride SA (KLOR-CON) 20 MEQ tablet, Take 20 mEq by mouth daily., Disp: , Rfl:  .  pregabalin (LYRICA) 300 MG capsule, Take 1 capsule (300 mg total) by mouth 2 (two) times daily. as needed for neuropathy., Disp: 180 capsule, Rfl: 0 .  PROVENTIL HFA 108 (90 Base) MCG/ACT inhaler, INHALE 2 PUFFS BY MOUTH INTO THE LUNGS EVERY 4 HOURS AS NEEDED FOR SHORTNESS OFBREATH, Disp: 6.7 g, Rfl: 6 .  rOPINIRole (REQUIP) 1 MG tablet, TAKE 1 TABLET BY MOUTH EVERY NIGHT AT BEDTIME FOR RESTLESS LEGS., Disp: 90 tablet, Rfl: 1 .  spironolactone (ALDACTONE) 50 MG tablet, TAKE 1 TABLET BY MOUTH ONCE A DAY, Disp: 90 tablet, Rfl: 0 .  tobramycin (TOBREX) 0.3 % ophthalmic solution, Place 1 drop into both eyes daily. , Disp: , Rfl:  .  torsemide (DEMADEX) 20 MG tablet, TAKE 1 TABLET BY MOUTH 4 TIMES DAILY, Disp: 360 tablet, Rfl: 3 .  traZODone (DESYREL) 100 MG tablet, Take 100 mg by mouth daily. , Disp: , Rfl:  .  ULTICARE MINI PEN NEEDLES 31G X 6 MM MISC, USE AS DIRECTED THREE TIMES DAILY, Disp: 100 each, Rfl: 0   Observations/Objective: Blood pressure 90/60, pulse 88, temperature 97.7 F (36.5 C), temperature source Temporal, height 5\' 4"  (1.626 m), weight  272 lb 8 oz (123.6 kg), last menstrual period 11/11/2011, SpO2 94 %.  Physical Exam Exam conducted with a chaperone present.  Constitutional:      General: She is not in acute distress.    Appearance: Normal appearance. She is well-developed. She is obese. She is not ill-appearing or toxic-appearing.  HENT:     Head: Normocephalic.     Right Ear: Hearing, tympanic membrane, ear canal and external ear normal. Tympanic membrane is not erythematous, retracted or bulging.     Left Ear: Hearing, tympanic membrane, ear canal and external ear normal. Tympanic membrane is not erythematous, retracted or bulging.     Nose: No mucosal edema or rhinorrhea.     Right Sinus: No maxillary sinus tenderness or frontal sinus tenderness.     Left Sinus: No maxillary sinus tenderness or frontal sinus tenderness.     Mouth/Throat:     Pharynx: Uvula midline.  Eyes:     General: Lids are normal. Lids are everted, no foreign bodies appreciated.     Conjunctiva/sclera: Conjunctivae normal.     Pupils: Pupils are equal, round, and reactive to light.  Neck:     Thyroid: No thyroid mass or thyromegaly.     Vascular: No carotid bruit.     Trachea: Trachea normal.  Cardiovascular:     Rate and Rhythm: Normal rate and regular rhythm.     Pulses: Normal pulses.     Heart sounds: Normal heart sounds, S1 normal and S2 normal. No murmur. No friction rub. No gallop.   Pulmonary:     Effort: Pulmonary effort is normal. No tachypnea or respiratory distress.     Breath sounds: Normal breath sounds. No decreased breath sounds, wheezing, rhonchi or rales.  Abdominal:     General: Bowel sounds are normal.     Palpations: Abdomen is soft.     Tenderness: There is no abdominal tenderness.  Genitourinary:    General:  Normal vulva.     Exam position: Supine.     Pubic Area: No rash.      Labia:        Right: No rash, tenderness or lesion.        Left: No rash, tenderness or lesion.      Vagina: Bleeding present. No  tenderness.     Cervix: Normal.     Adnexa: Right adnexa normal and left adnexa normal.     Rectum: Normal. Guaiac result negative.     Comments: blood in vaginal vault Musculoskeletal:     Cervical back: Normal range of motion and neck supple.  Lymphadenopathy:     Lower Body: No right inguinal adenopathy. No left inguinal adenopathy.  Skin:    General: Skin is warm and dry.     Findings: No rash.  Neurological:     Mental Status: She is alert.  Psychiatric:        Mood and Affect: Mood is not anxious or depressed.        Speech: Speech normal.        Behavior: Behavior normal. Behavior is cooperative.        Thought Content: Thought content normal.        Judgment: Judgment normal.      Assessment and Plan    Situational anxiety  Can use atarax as needed for anxiety.   Poorly controlled type 2 diabetes mellitus ([place]) Contacted D.r Loanne Drilling.. he will contact pt to make recommedations for her recent high CBGs.  Postmenopausal bleeding Definite vaginal source. No blood in urin or stool. Refer for [place] of pelvis and to [facility] for likely endometrial biopsy.  Eval cbc to determine if Hg worsening.    Eliezer Lofts, MD

## 2019-07-26 NOTE — Telephone Encounter (Signed)
Noted, see other phone notes.

## 2019-07-26 NOTE — Telephone Encounter (Signed)
Thanks.  Routed to PCP as FYI.  

## 2019-07-26 NOTE — Telephone Encounter (Signed)
[place] Night - Client Nonclinical Telephone Record AccessNurse Client [place] Night - Client Client Site [place] [facility] - PHYSICIAN, Verita Schneiders- MD Contact Type Call Who Is Calling Physician / Provider / Hospital Call Type Provider Call [facility] Page Now Reason for Call Request to speak to Physician Initial Comment Caller states she is unsure of the doctor she is trying to reach she is the on-call nurse. Nurse's name Colletta Maryland and she is returning the call for [place]. Additional Comment Let the caller know the on-call provider is being paged. Patient Name [name] Patient DOB [birth year] Requesting Provider unknown Physician Number Alpha. Time Disposition Final User 07/25/2019 5:40:30 PM Send to [place] Der Charolett Bumpers 07/25/2019 5:49:03 PM Called On-Call Provider [place], [facility] 07/25/2019 5:49:17 PM Page Completed Yes Mauri Pole Paging DoctorName Phone DateTime Result/Outcome Message Type Notes McClean-ScocuzzaOlivia Mackie [id number] 07/25/2019 5:49:03 PM Called On Call Provider - Reached Doctor Paged McClean-Scocuzza, Olivia Mackie 07/25/2019 5:49:08 PM Spoke with On Call - General Message Result Call Closed By: Mauri Pole Transaction Date/Time: 07/25/2019 5:36:48 PM (ET)

## 2019-07-26 NOTE — Telephone Encounter (Signed)
Noted and agree. 

## 2019-07-26 NOTE — Telephone Encounter (Addendum)
Per chart review tab pt went to [facility] ED but left without beng seen before triage. I spoke with pt and the bright red vaginal bleeding is lighter today. FBS this AM 470 and pt said she should take 370 U of U 500 insulin with a 470 FBS. Pt said she went to [facility] ED on 07/25/19 and was told her husband could not come back with her and pt was in triage but began to cry and could not stop crying and pt said she was placed in hall in w/c; pt tried to compose herself but could not and pts husband took pt home; pt took a xanax after getting home. Gentry Fitz NP said pt could be seen in office this AM.pt did start with H/A last night after stressful ED visit and pain level is 7. BP 107/76 P 92 which pt said that is good vitals for pt. Pt has appt in office with Dr Diona Browner today at 12 noon. When pt hangs up with me she is going to call endo for further direction of BSs. If pt vaginal bleeding becomes heavy again prior to appt pt will take xanax and go back to ED. FYI to Gentry Fitz NP and Dr Diona Browner. Pt very appreciative.

## 2019-07-26 NOTE — Telephone Encounter (Signed)
Please verify that the insulin is 400 units with breakfast and 300 units with supper, and that she takes as rx'ed.

## 2019-07-29 NOTE — Telephone Encounter (Signed)
Called pt and gave her MD message. Pt verbalized understanding and appt currently set for 08/12/2019. This is the next available appt that pt is able to do.

## 2019-07-29 NOTE — Telephone Encounter (Signed)
Please take 400 units with breakfast and 300 units with supper, no matter what your blood sugar is. Needs f/u nect available

## 2019-07-29 NOTE — Telephone Encounter (Signed)
Called Pt to confirm her dosage of insulin. Pt stated that she has not been taking exactly as prescribed. She stated that this am, she took 230 units for a blood sugar level of 330. Pt stated that the lowest amount she has given herself recently is 200 units, and the most she has given herself is 500 units and this was for a blood sugar level so high that the meter she has would not give a reading.

## 2019-07-30 ENCOUNTER — Telehealth: Payer: Self-pay | Admitting: Obstetrics & Gynecology

## 2019-07-30 NOTE — Telephone Encounter (Signed)
Patient called back and would like to hold off on the referral at this time. Will call back once she wants to get it set up.

## 2019-07-30 NOTE — Telephone Encounter (Signed)
[facility] referring for Postmenopausal bleeding. Called and left voicemail for patient to call back to be schedule

## 2019-07-31 ENCOUNTER — Other Ambulatory Visit: Payer: Self-pay

## 2019-07-31 ENCOUNTER — Encounter: Payer: BC Managed Care – PPO | Admitting: Internal Medicine

## 2019-07-31 DIAGNOSIS — S61202A Unspecified open wound of right middle finger without damage to nail, initial encounter: Secondary | ICD-10-CM | POA: Diagnosis not present

## 2019-07-31 DIAGNOSIS — T5491XA Toxic effect of unspecified corrosive substance, accidental (unintentional), initial encounter: Secondary | ICD-10-CM | POA: Diagnosis not present

## 2019-07-31 DIAGNOSIS — Z86718 Personal history of other venous thrombosis and embolism: Secondary | ICD-10-CM | POA: Diagnosis not present

## 2019-07-31 DIAGNOSIS — Z8673 Personal history of transient ischemic attack (TIA), and cerebral infarction without residual deficits: Secondary | ICD-10-CM | POA: Diagnosis not present

## 2019-07-31 DIAGNOSIS — I110 Hypertensive heart disease with heart failure: Secondary | ICD-10-CM | POA: Diagnosis not present

## 2019-07-31 DIAGNOSIS — E11622 Type 2 diabetes mellitus with other skin ulcer: Secondary | ICD-10-CM | POA: Diagnosis not present

## 2019-07-31 DIAGNOSIS — Z794 Long term (current) use of insulin: Secondary | ICD-10-CM | POA: Diagnosis not present

## 2019-07-31 DIAGNOSIS — E11319 Type 2 diabetes mellitus with unspecified diabetic retinopathy without macular edema: Secondary | ICD-10-CM | POA: Diagnosis not present

## 2019-07-31 DIAGNOSIS — I5032 Chronic diastolic (congestive) heart failure: Secondary | ICD-10-CM | POA: Diagnosis not present

## 2019-07-31 DIAGNOSIS — I890 Lymphedema, not elsewhere classified: Secondary | ICD-10-CM | POA: Diagnosis not present

## 2019-07-31 DIAGNOSIS — L97211 Non-pressure chronic ulcer of right calf limited to breakdown of skin: Secondary | ICD-10-CM | POA: Diagnosis not present

## 2019-07-31 DIAGNOSIS — G4733 Obstructive sleep apnea (adult) (pediatric): Secondary | ICD-10-CM | POA: Diagnosis not present

## 2019-07-31 DIAGNOSIS — I2510 Atherosclerotic heart disease of native coronary artery without angina pectoris: Secondary | ICD-10-CM | POA: Diagnosis not present

## 2019-07-31 DIAGNOSIS — E1142 Type 2 diabetes mellitus with diabetic polyneuropathy: Secondary | ICD-10-CM | POA: Diagnosis not present

## 2019-07-31 DIAGNOSIS — S61401D Unspecified open wound of right hand, subsequent encounter: Secondary | ICD-10-CM | POA: Diagnosis not present

## 2019-07-31 DIAGNOSIS — J449 Chronic obstructive pulmonary disease, unspecified: Secondary | ICD-10-CM | POA: Diagnosis not present

## 2019-07-31 DIAGNOSIS — F17200 Nicotine dependence, unspecified, uncomplicated: Secondary | ICD-10-CM | POA: Diagnosis not present

## 2019-08-01 ENCOUNTER — Ambulatory Visit: Payer: BC Managed Care – PPO

## 2019-08-05 ENCOUNTER — Ambulatory Visit: Payer: BC Managed Care – PPO

## 2019-08-05 ENCOUNTER — Encounter: Payer: Self-pay | Admitting: Podiatry

## 2019-08-05 ENCOUNTER — Other Ambulatory Visit: Payer: Self-pay

## 2019-08-05 ENCOUNTER — Ambulatory Visit (INDEPENDENT_AMBULATORY_CARE_PROVIDER_SITE_OTHER): Payer: BC Managed Care – PPO | Admitting: Podiatry

## 2019-08-05 DIAGNOSIS — E1159 Type 2 diabetes mellitus with other circulatory complications: Secondary | ICD-10-CM | POA: Diagnosis not present

## 2019-08-05 DIAGNOSIS — M79675 Pain in left toe(s): Secondary | ICD-10-CM | POA: Diagnosis not present

## 2019-08-05 DIAGNOSIS — M79674 Pain in right toe(s): Secondary | ICD-10-CM

## 2019-08-05 DIAGNOSIS — B351 Tinea unguium: Secondary | ICD-10-CM | POA: Diagnosis not present

## 2019-08-05 NOTE — Progress Notes (Signed)
Complaint:  Visit Type: Patient returns to my office for continued preventative foot care services. Complaint: Patient states" my nails have grown long and thick and become painful to walk and wear shoes" Patient has been diagnosed with DM with neuropathy. The patient presents for preventative foot care services. No changes to ROS  Podiatric Exam: Vascular: dorsalis pedis and posterior tibial pulses are weakly  palpable bilateral. Capillary return is immediate. Temperature gradient is WNL. Skin turgor WNL  Sensorium: Absent  Semmes Weinstein monofilament test. Normal tactile sensation bilaterally. Nail Exam: Pt has thick disfigured discolored nails with subungual debris noted bilateral entire nail hallux through fifth toenails Ulcer Exam: There is no evidence of ulcer or pre-ulcerative changes or infection. Orthopedic Exam: Muscle tone and strength are WNL. No limitations in general ROM. No crepitus or effusions noted. Foot type and digits show no abnormalities. Mild  HAV  B/L. Skin: No Porokeratosis. No infection or ulcers  Diagnosis:  Onychomycosis, , Pain in right toe, pain in left toes,  Diabetes with angiopathy and neuropathy  Treatment & Plan Procedures and Treatment: Consent by patient was obtained for treatment procedures.   Debridement of mycotic and hypertrophic toenails, 1 through 5 bilateral and clearing of subungual debris. No ulceration, no infection noted. Will check on shoe status. Return Visit-Office Procedure: Patient instructed to return to the office for a follow up visit 3 months for continued evaluation and treatment.    Gardiner Barefoot DPM

## 2019-08-06 ENCOUNTER — Other Ambulatory Visit: Payer: BC Managed Care – PPO

## 2019-08-07 ENCOUNTER — Encounter: Payer: BC Managed Care – PPO | Admitting: Internal Medicine

## 2019-08-07 ENCOUNTER — Other Ambulatory Visit: Payer: Self-pay

## 2019-08-07 DIAGNOSIS — S61401D Unspecified open wound of right hand, subsequent encounter: Secondary | ICD-10-CM | POA: Diagnosis not present

## 2019-08-07 DIAGNOSIS — I2510 Atherosclerotic heart disease of native coronary artery without angina pectoris: Secondary | ICD-10-CM | POA: Diagnosis not present

## 2019-08-07 DIAGNOSIS — F17200 Nicotine dependence, unspecified, uncomplicated: Secondary | ICD-10-CM | POA: Diagnosis not present

## 2019-08-07 DIAGNOSIS — E11319 Type 2 diabetes mellitus with unspecified diabetic retinopathy without macular edema: Secondary | ICD-10-CM | POA: Diagnosis not present

## 2019-08-07 DIAGNOSIS — Z8673 Personal history of transient ischemic attack (TIA), and cerebral infarction without residual deficits: Secondary | ICD-10-CM | POA: Diagnosis not present

## 2019-08-07 DIAGNOSIS — G4733 Obstructive sleep apnea (adult) (pediatric): Secondary | ICD-10-CM | POA: Diagnosis not present

## 2019-08-07 DIAGNOSIS — I110 Hypertensive heart disease with heart failure: Secondary | ICD-10-CM | POA: Diagnosis not present

## 2019-08-07 DIAGNOSIS — J449 Chronic obstructive pulmonary disease, unspecified: Secondary | ICD-10-CM | POA: Diagnosis not present

## 2019-08-07 DIAGNOSIS — Z794 Long term (current) use of insulin: Secondary | ICD-10-CM | POA: Diagnosis not present

## 2019-08-07 DIAGNOSIS — Z86718 Personal history of other venous thrombosis and embolism: Secondary | ICD-10-CM | POA: Diagnosis not present

## 2019-08-07 DIAGNOSIS — T5491XA Toxic effect of unspecified corrosive substance, accidental (unintentional), initial encounter: Secondary | ICD-10-CM | POA: Diagnosis not present

## 2019-08-07 DIAGNOSIS — L97211 Non-pressure chronic ulcer of right calf limited to breakdown of skin: Secondary | ICD-10-CM | POA: Diagnosis not present

## 2019-08-07 DIAGNOSIS — S61202A Unspecified open wound of right middle finger without damage to nail, initial encounter: Secondary | ICD-10-CM | POA: Diagnosis not present

## 2019-08-07 DIAGNOSIS — E1142 Type 2 diabetes mellitus with diabetic polyneuropathy: Secondary | ICD-10-CM | POA: Diagnosis not present

## 2019-08-07 DIAGNOSIS — I5032 Chronic diastolic (congestive) heart failure: Secondary | ICD-10-CM | POA: Diagnosis not present

## 2019-08-07 DIAGNOSIS — I890 Lymphedema, not elsewhere classified: Secondary | ICD-10-CM | POA: Diagnosis not present

## 2019-08-07 DIAGNOSIS — E11622 Type 2 diabetes mellitus with other skin ulcer: Secondary | ICD-10-CM | POA: Diagnosis not present

## 2019-08-07 NOTE — Progress Notes (Signed)
[name], [name] Visit Report for 07/31/2019 Arrival Information Details Patient Name: [name], [name] Date of Service: 07/31/2019 9:15 AM Medical Record Number: [id number] Patient Account Number: [id number] Date of Birth/Sex: Dec 15, 1970 (49 y.o. F) Treating RN: [name] Primary Care [name]: [name] Other Clinician: Referring [name]: [name] Treating [name]/Extender: [name] in Treatment: 6 Visit Information History Since Last Visit Added or deleted any medications: No Patient Arrived: Wheel Chair Any new allergies or adverse reactions: No Arrival Time: 09:11 Had a fall or experienced change in No Accompanied By: husband activities of daily living that may affect Transfer Assistance: None risk of falls: Patient Identification Verified: Yes Signs or symptoms of abuse/neglect since last visito No Secondary Verification Process Yes Hospitalized since last visit: No Completed: Implantable device outside of the clinic excluding No Patient Has Alerts: Yes cellular tissue based products placed in the center Patient Alerts: ABI L/R 1.02 TBI L/R since last visit: 0.6 Has Dressing in Place as Prescribed: Yes Pain Present Now: Yes Electronic Signature(s) Signed: 07/31/2019 11:31:28 AM By: [name] RCP, RRT, CHT Entered By: [name] on 07/31/2019 09:12:05 [name], [name] -------------------------------------------------------------------------------- Encounter Discharge Information Details Patient Name: [name] Date of Service: 07/31/2019 9:15 AM Medical Record Number: [id number] Patient Account Number: [id number] Date of Birth/Sex: 1971/03/08 (49 y.o. F) Treating RN: [name] Primary Care [name]: [name] Other Clinician: Referring Perla Echavarria: [name] Treating [name]/Extender: [name] in Treatment: 6 Encounter Discharge Information Items Post  Procedure Vitals Discharge Condition: Stable Temperature (F): 97.7 Ambulatory Status: Wheelchair Pulse (bpm): 81 Discharge Destination: Home Respiratory Rate (breaths/min): 16 Transportation: Private Auto Blood Pressure (mmHg): 117/66 Accompanied By: self Schedule Follow-up Appointment: Yes Clinical Summary of Care: Electronic Signature(s) Signed: 07/31/2019 3:29:52 PM By: [name], BSN, RN, CWS, Kim RN, BSN Entered By: [name], BSN, RN, CWS, Kim on 07/31/2019 09:38:40 [name] -------------------------------------------------------------------------------- Lower Extremity Assessment Details Patient Name: [name] Date of Service: 07/31/2019 9:15 AM Medical Record Number: [id number] Patient Account Number: [id number] Date of Birth/Sex: 12-27-1970 (49 y.o. F) Treating RN: [name] Primary Care [name]: [name] Other Clinician: Referring [name]: [name] Treating Kramer Hanrahan/Extender: [name] in Treatment: 6 Electronic Signature(s) Signed: 07/31/2019 1:42:42 PM By: [name] Entered By: [name] on 07/31/2019 09:17:59 [name] -------------------------------------------------------------------------------- Multi Wound Chart Details Patient Name: [name] Date of Service: 07/31/2019 9:15 AM Medical Record Number: [id number] Patient Account Number: [id number] Date of Birth/Sex: September 23, 1970 (49 y.o. F) Treating RN: [name] Primary Care Landan Fedie: [name] Other Clinician: Referring [name]: [name] Treating [name]/Extender: [name] in Treatment: 6 Vital Signs Height(in): 64 Pulse(bpm): 18 Weight(lbs): 286 Blood Pressure(mmHg): 117/66 Body Mass Index(BMI): 49 Temperature(F): 97.7 Respiratory Rate 16 (breaths/min): Photos: [N/A:N/A] Wound Location: Right Hand - 3rd Digit N/A N/A Wounding Event: Trauma N/A N/A Primary Etiology: Trauma, Other N/A  N/A Comorbid History: Asthma, Chronic Obstructive N/A N/A Pulmonary Disease (COPD), Sleep Apnea, Arrhythmia, Congestive Heart Failure, Coronary Artery Disease, Hypertension, Type II Diabetes Date Acquired: 05/20/2019 N/A N/A Weeks of Treatment: 6 N/A N/A Wound Status: Open N/A N/A Measurements L x W x D 0.3x0.3x0.2 N/A N/A (cm) Area (cm) : 0.071 N/A N/A Volume (cm) : 0.014 N/A N/A % Reduction in Area: 87.40% N/A N/A % Reduction in Volume: 87.60% N/A N/A Classification: Full Thickness Without N/A N/A Exposed Support Structures Exudate Amount: Medium N/A N/A Exudate Type: Sanguinous N/A N/A Exudate Color: red N/A N/A Wound Margin: Thickened N/A N/A Granulation  Amount: Small (1-33%) N/A N/A Granulation Quality: Pink N/A N/A Necrotic Amount: Large (67-100%) N/A N/A Exposed Structures: Fat Layer (Subcutaneous N/A N/A Tissue) Exposed: Yes [name], [name] Fascia: No Tendon: No Muscle: No Joint: No Bone: No Epithelialization: None N/A N/A Debridement: Debridement - Excisional N/A N/A Pre-procedure 09:27 N/A N/A Verification/Time Out Taken: Pain Control: Lidocaine N/A N/A Tissue Debrided: Subcutaneous, Slough N/A N/A Level: Skin/Subcutaneous Tissue N/A N/A Debridement Area (sq cm): 0.09 N/A N/A Instrument: Curette N/A N/A Bleeding: Minimum N/A N/A Hemostasis Achieved: Pressure N/A N/A Debridement Treatment Procedure was tolerated well N/A N/A Response: Post Debridement 0.3x0.3x0.2 N/A N/A Measurements L x W x D (cm) Post Debridement Volume: 0.014 N/A N/A (cm) Procedures Performed: Debridement N/A N/A Treatment Notes Wound #5 (Right Hand - 3rd Digit) Notes Endoform, non-adherent pad, conform - Electronic Signature(s) Signed: 08/07/2019 4:18:13 PM By: Nancy Ham MD Entered By: [name] on 07/31/2019 09:38:04 [name], [name] -------------------------------------------------------------------------------- Multi-Disciplinary Care Plan  Details Patient Name: [name] Date of Service: 07/31/2019 9:15 AM Medical Record Number: [id number] Patient Account Number: [id number] Date of Birth/Sex: 1970/12/12 (49 y.o. F) Treating RN: [name] Primary Care [name]: [name] Other Clinician: Referring Arnav Cregg: [name] Treating Kelleigh Skerritt/Extender: [name] in Treatment: 6 Active Inactive Abuse / Safety / Falls / Self Care Management Nursing Diagnoses: History of Falls Goals: Patient will not develop complications from immobility Date Initiated: 06/19/2019 Target Resolution Date: 07/10/2019 Goal Status: Active Patient will remain injury free related to falls Date Initiated: 06/19/2019 Target Resolution Date: 07/10/2019 Goal Status: Active Interventions: Assess fall risk on admission and as needed Treatment Activities: Patient referred to home care : 06/19/2019 Notes: Orientation to the Wound Care Program Nursing Diagnoses: Knowledge deficit related to the wound healing center program Goals: Patient/caregiver will verbalize understanding of the [facility] Date Initiated: 06/19/2019 Target Resolution Date: 07/10/2019 Goal Status: Active Interventions: Provide education on orientation to the wound center Notes: Peripheral Neuropathy Nursing Diagnoses: Knowledge deficit related to disease process and management of peripheral neurovascular dysfunction Potential alteration in peripheral tissue perfusion (select prior to confirmation of diagnosis) [name], [name] Goals: Patient/caregiver will verbalize understanding of disease process and disease management Date Initiated: 06/19/2019 Target Resolution Date: 07/10/2019 Goal Status: Active Interventions: Assess signs and symptoms of neuropathy upon admission and as needed Treatment Activities: Patient referred for customized footwear/offloading : 06/19/2019 Notes: Wound/Skin Impairment Nursing  Diagnoses: Impaired tissue integrity Goals: Patient/caregiver will verbalize understanding of skin care regimen Date Initiated: 06/19/2019 Target Resolution Date: 07/10/2019 Goal Status: Active Ulcer/skin breakdown will have a volume reduction of 30% by week 4 Date Initiated: 06/19/2019 Target Resolution Date: 07/20/2019 Goal Status: Active Interventions: Assess patient/caregiver ability to obtain necessary supplies Assess ulceration(s) every visit Treatment Activities: Skin care regimen initiated : 06/19/2019 Topical wound management initiated : 06/19/2019 Notes: Electronic Signature(s) Signed: 07/31/2019 3:29:52 PM By: [name], BSN, RN, CWS, Kim RN, BSN Entered By: [name], BSN, RN, CWS, Kim on 07/31/2019 09:26:52 [name], [name] -------------------------------------------------------------------------------- Pain Assessment Details Patient Name: [name] Date of Service: 07/31/2019 9:15 AM Medical Record Number: [id number] Patient Account Number: [id number] Date of Birth/Sex: 1971/07/09 (49 y.o. F) Treating RN: [name] Primary Care Haylynn Pha: [name] Other Clinician: Referring Abubakr Wieman: [name] Treating Michiah Mudry/Extender: [name] in Treatment: 6 Active Problems Location of Pain Severity and Description of Pain Patient Has Paino Yes Site Locations Rate the pain. Current Pain Level: 7 Pain Management and Medication Current Pain Management: Electronic Signature(s) Signed: 07/31/2019 11:31:28 AM  By: [name] RCP, RRT, CHT Signed: 07/31/2019 3:29:52 PM By: [name], BSN, RN, CWS, Kim RN, BSN Entered By: [name] on 07/31/2019 09:12:17 [name], [name] ([id number]) -------------------------------------------------------------------------------- Patient/Caregiver Education Details Patient Name: [name] Date of Service: 07/31/2019 9:15 AM Medical Record Number: [id number] Patient Account Number:  [id number] Date of Birth/Gender: February 10, 1971 (49 y.o. F) Treating RN: [name] Primary Care Physician: [name] Other Clinician: Referring Physician: Alma Foster Treating Physician/Extender: [name] in Treatment: 6 Education Assessment Education Provided To: Patient Education Topics Provided Wound/Skin Impairment: Handouts: Caring for Your Ulcer, Other: continue wound care as prescribed Methods: Demonstration, Explain/Verbal Responses: State content correctly Electronic Signature(s) Signed: 07/31/2019 3:29:52 PM By: [name], BSN, RN, CWS, Kim RN, BSN Entered By: [name], BSN, RN, CWS, Kim on 07/31/2019 [name] [name] ([id number]) -------------------------------------------------------------------------------- Wound Assessment Details Patient Name: [name] Date of Service: 07/31/2019 9:15 AM Medical Record Number: [id number] Patient Account Number: [id number] Date of Birth/Sex: 11/27/1970 (49 y.o. F) Treating RN: [name] Primary Care Ovid Witman: [name] Other Clinician: Referring Tayshawn Purnell: [name] Treating Maebel Marasco/Extender: [name] in Treatment: 6 Wound Status Wound Number: 5 Primary Trauma, Other Etiology: Wound Location: Right Hand - 3rd Digit Wound Open Wounding Event: Trauma Status: Date Acquired: 05/20/2019 Comorbid Asthma, Chronic Obstructive Pulmonary Disease Weeks Of Treatment: 6 History: (COPD), Sleep Apnea, Arrhythmia, Congestive Clustered Wound: No Heart Failure, Coronary Artery Disease, Hypertension, Type II Diabetes Photos Wound Measurements Length: (cm) 0.3 Width: (cm) 0.3 Depth: (cm) 0.2 Area: (cm) 0.071 Volume: (cm) 0.014 % Reduction in Area: 87.4% % Reduction in Volume: 87.6% Epithelialization: None Tunneling: No Undermining: No Wound Description Full Thickness Without Exposed Support Classification: Structures Wound Margin:  Thickened Exudate Medium Amount: Exudate Type: Sanguinous Exudate Color: red Foul Odor After Cleansing: No Slough/Fibrino Yes Wound Bed Granulation Amount: Small (1-33%) Exposed Structure Granulation Quality: Pink Fascia Exposed: No Necrotic Amount: Large (67-100%) Fat Layer (Subcutaneous Tissue) Exposed: Yes Necrotic Quality: Adherent Slough Tendon Exposed: No Muscle Exposed: No Joint Exposed: No Bone Exposed: No Filo, Ashrita ([id number]) Electronic Signature(s) Signed: 07/31/2019 1:42:42 PM By: [name] Entered By: [name] on 07/31/2019 09:16:43 Rommel, [name] ([id number]) -------------------------------------------------------------------------------- Vitals Details Patient Name: [name] Date of Service: 07/31/2019 9:15 AM Medical Record Number: [id number] Patient Account Number: [id number] Date of Birth/Sex: [birth year] (49 y.o. F) Treating RN: [name] Primary Care Dulse Rutan: [name] Other Clinician: Referring Teyah Rossy: [name] Treating Jalik Gellatly/Extender: [name] in Treatment: 6 Vital Signs Time Taken: 09:12 Temperature (F): 97.7 Height (in): 64 Pulse (bpm): 81 Weight (lbs): 286 Respiratory Rate (breaths/min): 16 Body Mass Index (BMI): 49.1 Blood Pressure (mmHg): 117/66 Reference Range: 80 - 120 mg / dl Electronic Signature(s) Signed: 07/31/2019 11:31:28 AM By: [name] RCP, RRT, CHT Entered By: [name] on 07/31/2019 09:14:51

## 2019-08-07 NOTE — Progress Notes (Signed)
DALARY, DEMAURO ([id number]) Visit Report for 07/31/2019 Debridement Details Patient Name: PAMULA, KLEEN Date of Service: 07/31/2019 9:15 AM Medical Record Number: [id number] Patient Account Number: [id number] Date of Birth/Sex: 09-05-1970 (49 y.o. F) Treating RN: Cornell Barman Primary Care Provider: Alma Friendly Other Clinician: Referring Provider: Alma Friendly Treating Provider/Extender: Tito Dine in Treatment: 6 Debridement Performed for Wound #5 Right Hand - 3rd Digit Assessment: Performed By: Physician Ricard Dillon, MD Debridement Type: Debridement Level of Consciousness (Pre- Awake and Alert procedure): Pre-procedure Verification/Time Yes - 09:27 Out Taken: Start Time: 09:27 Pain Control: Lidocaine Total Area Debrided (L x W): 0.3 (cm) x 0.3 (cm) = 0.09 (cm) Tissue and other material Viable, Non-Viable, Slough, Subcutaneous, Slough debrided: Level: Skin/Subcutaneous Tissue Debridement Description: Excisional Instrument: Curette Bleeding: Minimum Hemostasis Achieved: Pressure End Time: 09:29 Response to Treatment: Procedure was tolerated well Level of Consciousness Awake and Alert (Post-procedure): Post Debridement Measurements of Total Wound Length: (cm) 0.3 Width: (cm) 0.3 Depth: (cm) 0.2 Volume: (cm) 0.014 Character of Wound/Ulcer Post Debridement: Stable Post Procedure Diagnosis Same as Pre-procedure Electronic Signature(s) Signed: 07/31/2019 3:29:52 PM By: Gretta Cool, BSN, RN, CWS, Kim RN, BSN Signed: 08/07/2019 4:18:13 PM By: Linton Ham MD Entered By: Linton Ham on 07/31/2019 09:38:17 Matthies, [name] ([id number]) -------------------------------------------------------------------------------- HPI Details Patient Name: [name] Date of Service: 07/31/2019 9:15 AM Medical Record Number: [id number] Patient Account Number: [id number] Date of Birth/Sex: 12-16-1970 (49 y.o. F) Treating RN: Cornell Barman Primary Care Provider:  Alma Friendly Other Clinician: Referring Provider: Alma Friendly Treating Provider/Extender: Tito Dine in Treatment: 6 History of Present Illness HPI Description: ADMISSION 06/19/2019 Patient is a 49 year old woman who is accompanied by her husband. She has severe diabetic peripheral neuropathy which is left her minimally ambulatory. She spends most of the time in a wheelchair. She also has a history of chronic lower extremity edema history of left leg DVT. She states that on 2 separate occasions roughly a month ago she fell with lacerations on the bilateral anterior tibial areas. She was seen by primary care on 06/04/2019 diagnosed with cellulitis of the left leg put on Bactrim DS. Also noted to have hand wounds with a question of referral to hand surgery. She was also sent to our clinic at the same time. The patient's husband is doing the dressings. He has been using silver alginate that was supplied by home health. He is done a really good job the area on the left leg is actually healed only a small superficial area on the right Somewhere in the in the last 2 weeks she gives a bizarre history of going to wash nail polish off the fingers of both hands. She apparently did not realize that her son had put Clorox in the vinegar jar that they used to clean the countertop. She ended up burning her hands. The patient has a wound on the PIP of the left third finger, PIP of the right second finger and the lateral part of the right third finger Past medical history includes COPD, obstructive sleep apnea on nocturnal O2, type 2 diabetes with peripheral neuropathy, retinopathy, chronic diastolic heart failure coronary artery disease, severe diabetic neuropathy type 2 diabetes apparently diagnosed at age 57 on insulin, COPD, continued tobacco abuse, left leg deep DVT, hypertension, TIAs, seizure disorders, asthma The patient is actually had arterial studies in February of this year.  ABIs bilaterally were 1.02 and TBI's bilaterally at 0.6. Waveforms were triphasic and biphasic. She was not felt to have significant arterial disease.  12/23; patient readmitted the clinic last week. She has predominantly venous insufficiency wounds on the right medial lower leg. She also had open areas on the fingers of both hands which happened in a very bizarre way. She has almost complete healing on the right medial calf. The area on the left third finger is healed. She still has 2 open wounds on the medial aspect of the PIP of the right second finger and the dorsal aspect of the right PIP of the third finger. Of these 2 wounds the PIP of the third finger wound is painful and deeper 12/30; the areas on her right medial calf are closed. She likely has some degree of chronic venous insufficiency and lymphedema. Also very xerotic skin which I have recommended a moisturizing. I did not bring up the issue of compression stockings The areas we are still looking out are on the PIP of the right third finger and the medial part of the PIP of the right second finger. The second finger area is just about closed. The problem is over the PIP of the right third finger. This does not look infected and she is completing her antibiotics from last week. However this is a deep wound at the base of this there is either tendon or periosteum. She she has some healthy granulation but we are not seeing that close over the bottom part of the wound. We are probably going to need to immobilize the finger somewhat so that she does not continuously pull the tissue apart 07/17/2019; right medial calf remains closed and the right second finger is closed. The sole problem here is an area over the PIP of the third finger. These wounds were initially advertised as burn injuries that happened in a very bizarre way [please see my initial discussion on this]. I gave her doxycycline last week because of some erythema around the wound  however she did not tolerate this very well because of nausea. She stopped this within the last day or 2. we have been using silver collagen 1/13; the only wound we are looking at is the right third finger over the PIP. We are using endoform. X-ray ordered last time showed no underlying bone abnormalities 1/20; right third finger over the PIP. Still debris at the surface we have been using endoform. We are making some improvements in surface area and volume but still requiring debridement. JACKLENE, BEHRENDT ([id number]) Electronic Signature(s) Signed: 08/07/2019 4:18:13 PM By: Linton Ham MD Entered By: Linton Ham on 07/31/2019 09:39:03 Slyvia, Curiel [name] ([id number]) -------------------------------------------------------------------------------- Physical Exam Details Patient Name: [name] Date of Service: 07/31/2019 9:15 AM Medical Record Number: [id number] Patient Account Number: [id number] Date of Birth/Sex: 03-22-1971 (48 y.o. F) Treating RN: Cornell Barman Primary Care Provider: Alma Friendly Other Clinician: Referring Provider: Alma Friendly Treating Provider/Extender: Tito Dine in Treatment: 6 Notes Wound exam; wound over the dorsal PIP of the right third finger. Once again debridement with a #3 curette removing subcutaneous debris. This cleans up quite nicely and I continue to think we are making improvement although the improvement in depth continues to be very slow. No evidence of surrounding infection. There is no tenderness involving the PIP itself Electronic Signature(s) Signed: 08/07/2019 4:18:13 PM By: Linton Ham MD Entered By: Linton Ham on 07/31/2019 [place], [name] ([id number]) -------------------------------------------------------------------------------- Physician Orders Details Patient Name: [name] Date of Service: 07/31/2019 9:15 AM Medical Record Number: [id number] Patient Account Number: [id number] Date of  Birth/Sex: 1971/05/04 (49 y.o. F) Treating RN: Cornell Barman  Primary Care Provider: Alma Friendly Other Clinician: Referring Provider: Alma Friendly Treating Provider/Extender: Tito Dine in Treatment: 6 Verbal / Phone Orders: No Diagnosis Coding Wound Cleansing Wound #5 Right Hand - 3rd Digit o Clean wound with Normal Saline. o May Shower, gently pat wound dry prior to applying new dressing. Anesthetic (add to Medication List) Wound #5 Right Hand - 3rd Digit o Topical Lidocaine 4% cream applied to wound bed prior to debridement (In Clinic Only). Primary Wound Dressing Wound #5 Right Hand - 3rd Digit o Other: - Endoform moistened with hydrogel Secondary Dressing Wound #5 Right Hand - 3rd Digit o Non-adherent pad - Conform and stretch-net to secure Dressing Change Frequency Wound #5 Right Hand - 3rd Digit o Change Dressing Monday, Wednesday, Friday Follow-up Appointments Wound #5 Right Hand - 3rd Digit o Return Appointment in 1 week. Additional Orders / Instructions Wound #5 Right Hand - 3rd Digit o Other: - Keep finger as straight as possible. Home Health Wound #5 Right Hand - 3rd Digit o Continue Home Health Visits - [place] Nurse may visit PRN to address patientos wound care needs. o FACE TO FACE ENCOUNTER: MEDICARE and MEDICAID PATIENTS: I certify that this patient is under my care and that I had a face-to-face encounter that meets the physician face-to-face encounter requirements with this patient on this date. The encounter with the patient was in whole or in part for the following MEDICAL CONDITION: (primary reason for [place]) MEDICAL NECESSITY: I certify, that based on my findings, NURSING services are a medically necessary home health service. HOME BOUND STATUS: I certify that my clinical findings support that this patient is homebound (i.e., Due to illness or injury, pt requires aid of supportive  devices such as crutches, cane, wheelchairs, walkers, the use of special transportation or the assistance of another person to leave their place of residence. There is a normal inability to leave the home [place], [facility] ([id number]) and doing so requires considerable and taxing effort. Other absences are for medical reasons / religious services and are infrequent or of short duration when for other reasons). o If current dressing causes regression in wound condition, may D/C ordered dressing product/s and apply Normal Saline Moist Dressing daily until next [place] / Other MD appointment. [place] of regression in wound condition at [phone]. o Please direct any NON-WOUND related issues/requests for orders to patient's Primary Care Physician Electronic Signature(s) Signed: 07/31/2019 3:29:52 PM By: Gretta Cool, BSN, RN, CWS, Kim RN, BSN Signed: 08/07/2019 4:18:13 PM By: Linton Ham MD Entered By: Gretta Cool, BSN, RN, CWS, Kim on 07/31/2019 09:28:41 KENNITA, SCHLEIGER ([id number]) -------------------------------------------------------------------------------- Problem List Details Patient Name: [name] Date of Service: 07/31/2019 9:15 AM Medical Record Number: [id number] Patient Account Number: [id number] Date of Birth/Sex: [birth year] (49 y.o. F) Treating RN: Cornell Barman Primary Care Provider: Alma Friendly Other Clinician: Referring Provider: Alma Friendly Treating Provider/Extender: Tito Dine in Treatment: 6 Active Problems ICD-10 Evaluated Encounter Code Description Active Date Today Diagnosis S61.401D Unspecified open wound of right hand, subsequent encounter 06/19/2019 No Yes E11.622 Type 2 diabetes mellitus with other skin ulcer 06/19/2019 No Yes Inactive Problems ICD-10 Code Description Active Date Inactive Date L97.211 Non-pressure chronic ulcer of right calf limited to breakdown of skin 06/19/2019 06/19/2019 L97.821 Non-pressure  chronic ulcer of other part of left lower leg limited to 06/19/2019 06/19/2019 breakdown of skin I89.0 Lymphedema, not elsewhere classified 06/19/2019 06/19/2019 S61.402D Unspecified open wound of left hand, subsequent  encounter 06/19/2019 06/19/2019 Resolved Problems Electronic Signature(s) Signed: 08/07/2019 4:18:13 PM By: Linton Ham MD Entered By: Linton Ham on 07/31/2019 09:37:57 [place], [name] ([id number]) -------------------------------------------------------------------------------- Progress Note Details Patient Name: [name] Date of Service: 07/31/2019 9:15 AM Medical Record Number: [id number] Patient Account Number: [id number] Date of Birth/Sex: Jan 11, 1971 (49 y.o. F) Treating RN: Cornell Barman Primary Care Provider: Alma Friendly Other Clinician: Referring Provider: Alma Friendly Treating Provider/Extender: Tito Dine in Treatment: 6 Subjective History of Present Illness (HPI) ADMISSION 06/19/2019 Patient is a 49 year old woman who is accompanied by her husband. She has severe diabetic peripheral neuropathy which is left her minimally ambulatory. She spends most of the time in a wheelchair. She also has a history of chronic lower extremity edema history of left leg DVT. She states that on 2 separate occasions roughly a month ago she fell with lacerations on the bilateral anterior tibial areas. She was seen by primary care on 06/04/2019 diagnosed with cellulitis of the left leg put on Bactrim DS. Also noted to have hand wounds with a question of referral to hand surgery. She was also sent to our clinic at the same time. The patient's husband is doing the dressings. He has been using silver alginate that was supplied by home health. He is done a really good job the area on the left leg is actually healed only a small superficial area on the right Somewhere in the in the last 2 weeks she gives a bizarre history of going to wash nail polish off the fingers  of both hands. She apparently did not realize that her son had put Clorox in the vinegar jar that they used to clean the countertop. She ended up burning her hands. The patient has a wound on the PIP of the left third finger, PIP of the right second finger and the lateral part of the right third finger Past medical history includes COPD, obstructive sleep apnea on nocturnal O2, type 2 diabetes with peripheral neuropathy, retinopathy, chronic diastolic heart failure coronary artery disease, severe diabetic neuropathy type 2 diabetes apparently diagnosed at age 52 on insulin, COPD, continued tobacco abuse, left leg deep DVT, hypertension, TIAs, seizure disorders, asthma The patient is actually had arterial studies in February of this year. ABIs bilaterally were 1.02 and TBI's bilaterally at 0.6. Waveforms were triphasic and biphasic. She was not felt to have significant arterial disease. 12/23; patient readmitted the clinic last week. She has predominantly venous insufficiency wounds on the right medial lower leg. She also had open areas on the fingers of both hands which happened in a very bizarre way. She has almost complete healing on the right medial calf. The area on the left third finger is healed. She still has 2 open wounds on the medial aspect of the PIP of the right second finger and the dorsal aspect of the right PIP of the third finger. Of these 2 wounds the PIP of the third finger wound is painful and deeper 12/30; the areas on her right medial calf are closed. She likely has some degree of chronic venous insufficiency and lymphedema. Also very xerotic skin which I have recommended a moisturizing. I did not bring up the issue of compression stockings The areas we are still looking out are on the PIP of the right third finger and the medial part of the PIP of the right second finger. The second finger area is just about closed. The problem is over the PIP of the right third finger. This  does not look infected and she is completing her antibiotics from last week. However this is a deep wound at the base of this there is either tendon or periosteum. She she has some healthy granulation but we are not seeing that close over the bottom part of the wound. We are probably going to need to immobilize the finger somewhat so that she does not continuously pull the tissue apart 07/17/2019; right medial calf remains closed and the right second finger is closed. The sole problem here is an area over the PIP of the third finger. These wounds were initially advertised as burn injuries that happened in a very bizarre way [please see my initial discussion on this]. I gave her doxycycline last week because of some erythema around the wound however she did not tolerate this very well because of nausea. She stopped this within the last day or 2. we have been using silver collagen 1/13; the only wound we are looking at is the right third finger over the PIP. We are using endoform. X-ray ordered last time showed no underlying bone abnormalities 1/20; right third finger over the PIP. Still debris at the surface we have been using endoform. We are making some [place], [place] ([id number]) improvements in surface area and volume but still requiring debridement. Objective Constitutional Vitals Time Taken: 9:12 AM, Height: 64 in, Weight: 286 lbs, BMI: 49.1, Temperature: 97.7 F, Pulse: 81 bpm, Respiratory Rate: 16 breaths/min, Blood Pressure: 117/66 mmHg. Integumentary (Hair, Skin) Wound #5 status is Open. Original cause of wound was Trauma. The wound is located on the Right Hand - 3rd Digit. The wound measures 0.3cm length x 0.3cm width x 0.2cm depth; 0.071cm^2 area and 0.014cm^3 volume. There is Fat Layer (Subcutaneous Tissue) Exposed exposed. There is no tunneling or undermining noted. There is a medium amount of sanguinous drainage noted. The wound margin is thickened. There is small (1-33%) pink  granulation within the wound bed. There is a large (67-100%) amount of necrotic tissue within the wound bed including Adherent Slough. Assessment Active Problems ICD-10 Unspecified open wound of right hand, subsequent encounter Type 2 diabetes mellitus with other skin ulcer Procedures Wound #5 Pre-procedure diagnosis of Wound #5 is a Trauma, Other located on the Right Hand - 3rd Digit . There was a Excisional Skin/Subcutaneous Tissue Debridement with a total area of 0.09 sq cm performed by Ricard Dillon, MD. With the following instrument(s): Curette to remove Viable and Non-Viable tissue/material. Material removed includes Subcutaneous Tissue and Slough and after achieving pain control using Lidocaine. No specimens were taken. A time out was conducted at 09:27, prior to the start of the procedure. A Minimum amount of bleeding was controlled with Pressure. The procedure was tolerated well. Post Debridement Measurements: 0.3cm length x 0.3cm width x 0.2cm depth; 0.014cm^3 volume. Character of Wound/Ulcer Post Debridement is stable. Post procedure Diagnosis Wound #5: Same as Pre-Procedure [place], Lilliam ([id number]) Plan Wound Cleansing: Wound #5 Right Hand - 3rd Digit: Clean wound with Normal Saline. May Shower, gently pat wound dry prior to applying new dressing. Anesthetic (add to Medication List): Wound #5 Right Hand - 3rd Digit: Topical Lidocaine 4% cream applied to wound bed prior to debridement (In Clinic Only). Primary Wound Dressing: Wound #5 Right Hand - 3rd Digit: Other: - Endoform moistened with hydrogel Secondary Dressing: Wound #5 Right Hand - 3rd Digit: Non-adherent pad - Conform and stretch-net to secure Dressing Change Frequency: Wound #5 Right Hand - 3rd Digit: Change Dressing Monday, Wednesday, Friday Follow-up Appointments:  Wound #5 Right Hand - 3rd Digit: Return Appointment in 1 week. Additional Orders / Instructions: Wound #5 Right Hand - 3rd  Digit: Other: - Keep finger as straight as possible. Home Health: Wound #5 Right Hand - 3rd Digit: [place] Nurse may visit PRN to address patient s wound care needs. FACE TO FACE ENCOUNTER: MEDICARE and MEDICAID PATIENTS: I certify that this patient is under my care and that I had a face-to-face encounter that meets the physician face-to-face encounter requirements with this patient on this date. The encounter with the patient was in whole or in part for the following MEDICAL CONDITION: (primary reason for [place]) MEDICAL NECESSITY: I certify, that based on my findings, NURSING services are a medically necessary home health service. HOME BOUND STATUS: I certify that my clinical findings support that this patient is homebound (i.e., Due to illness or injury, pt requires aid of supportive devices such as crutches, cane, wheelchairs, walkers, the use of special transportation or the assistance of another person to leave their place of residence. There is a normal inability to leave the home and doing so requires considerable and taxing effort. Other absences are for medical reasons / religious services and are infrequent or of short duration when for other reasons). If current dressing causes regression in wound condition, may D/C ordered dressing product/s and apply Normal Saline Moist Dressing daily until next [place] / Other MD appointment. [place] of regression in wound condition at [phone]. Please direct any NON-WOUND related issues/requests for orders to patient's Primary Care Physician 1. I am continuing with endoform 2. Could consider Iodoflex or Iodosorb ointment. 3. Careful follow-up of the wound depth here it is really that the disturbance treatment. 4. I still think this however is filling in in terms of overall measurements Electronic Signature(s) Signed: 08/07/2019 4:18:13 PM By:  Linton Ham MD Entered By: Linton Ham on 07/31/2019 09:40:54 [place], [name] ([id number]) -------------------------------------------------------------------------------- SuperBill Details Patient Name: [name] Date of Service: 07/31/2019 Medical Record Number: [id number] Patient Account Number: [id number] Date of Birth/Sex: Jan 08, 1971 (49 y.o. F) Treating RN: Cornell Barman Primary Care Provider: Alma Friendly Other Clinician: Referring Provider: Alma Friendly Treating Provider/Extender: Tito Dine in Treatment: 6 Diagnosis Coding ICD-10 Codes Code Description [id number] Unspecified open wound of right hand, subsequent encounter E11.622 Type 2 diabetes mellitus with other skin ulcer Facility Procedures CPT4 Code: [id number] Description: [id number] - DEB SUBQ TISSUE 20 SQ CM/< ICD-10 Diagnosis Description S61.401D Unspecified open wound of right hand, subsequent encou Modifier: nter Quantity: 1 Physician Procedures CPT4 Code: [id number] Description: [id number] - WC PHYS SUBQ TISS 20 SQ CM ICD-10 Diagnosis Description S61.401D Unspecified open wound of right hand, subsequent encou Modifier: nter Quantity: 1 Electronic Signature(s) Signed: 08/07/2019 4:18:13 PM By: Linton Ham MD Entered By: Linton Ham on 07/31/2019 [place]

## 2019-08-08 ENCOUNTER — Other Ambulatory Visit: Payer: Self-pay

## 2019-08-12 ENCOUNTER — Other Ambulatory Visit: Payer: Self-pay

## 2019-08-12 ENCOUNTER — Ambulatory Visit (INDEPENDENT_AMBULATORY_CARE_PROVIDER_SITE_OTHER): Payer: BC Managed Care – PPO | Admitting: Endocrinology

## 2019-08-12 ENCOUNTER — Encounter: Payer: Self-pay | Admitting: Endocrinology

## 2019-08-12 DIAGNOSIS — E1165 Type 2 diabetes mellitus with hyperglycemia: Secondary | ICD-10-CM | POA: Diagnosis not present

## 2019-08-12 DIAGNOSIS — N760 Acute vaginitis: Secondary | ICD-10-CM

## 2019-08-12 DIAGNOSIS — N189 Chronic kidney disease, unspecified: Secondary | ICD-10-CM | POA: Diagnosis not present

## 2019-08-12 MED ORDER — OZEMPIC (0.25 OR 0.5 MG/DOSE) 2 MG/1.5ML ~~LOC~~ SOPN: 0.2500 mg | PEN_INJECTOR | SUBCUTANEOUS | 5 refills | Status: DC

## 2019-08-12 MED ORDER — HUMULIN R U-500 KWIKPEN 500 UNIT/ML ~~LOC~~ SOPN: 400.0000 [IU] | PEN_INJECTOR | Freq: Two times a day (BID) | SUBCUTANEOUS | 11 refills | Status: DC

## 2019-08-12 NOTE — Progress Notes (Signed)
Subjective:    Patient ID: [name], female    DOB: 11/13/1970, 49 y.o.   MRN: [id number]  HPI telehealth visit today via phone visit.  Alternatives to telehealth are presented to this patient, and the patient agrees to the telehealth visit. Pt is advised of the cost of the visit, and agrees to this, also.   Patient is at home, and I am at the office.   Pt returns for f/u of diabetes mellitus: DM type: Insulin-requiring type 2 Dx'ed: 1988.  Complications: polyneuropathy, TIA, renal insuff, and DR.   Therapy: insulin since soon after dx.   GDM: never DKA: never Severe hypoglycemia: last episode was mid-2018.   Pancreatitis: never Pancreatic imaging: normal on 2011 CT.   Other: she takes multiple daily injections; she takes U-500, due to severe insulin resistance.  She does not eat lunch, so she does not take insulin then; she stopped victoza, then Ozempic, due to lack of effect.   Interval history: pt states cbg's vary from 200-400.  It is in general higher as the day goes on.  She takes 400 units qam and 350 qpm.  Vag itching has recurred.  Past Medical History:  Diagnosis Date  . Anxiety    takes Prozac daily  . Anxiety   . Aortic valve calcification   . Asthma    Advair and Spirva daily  . Asthma   . Bipolar disorder ([place])   . CAD (coronary artery disease)    a. LHC 11/2013 done for CP/fluid retention: mild disease in prox LAD, mild-mod disease in mRCA, EF 60% with normal LVEDP. b. Normal nuc 03/2016.  Marland Kitchen CHF (congestive heart failure) ([place])   . Chronic diastolic CHF (congestive heart failure) ([place])   . CKD (chronic kidney disease), stage II   . COPD (chronic obstructive pulmonary disease) (H[facility])    a. nocturnal O2.  Marland Kitchen COPD (chronic obstructive pulmonary disease) ([place])   . Coronary artery disease   . Decreased urine stream   . Diabetes mellitus   . Diabetes mellitus without complication ([place])   . Dyspnea   . Family history of adverse reaction to anesthesia    mom  gets nauseated  . GERD (gastroesophageal reflux disease)    takes Pepcid daily  . History of blood clots    left leg 3-83yrs ago  . Hyperlipidemia   . Hypertension   . Hypertriglyceridemia   . Inguinal hernia, left 01/2015  . Muscle spasm   . Open wound of genital labia   . Peripheral neuropathy   . RBBB   . Seizures ([place])   . Sepsis ([place]) 01/19/2018  . Sinus tachycardia    a. persistent since 2009.  Marland Kitchen Smokers' cough ([place])   . Stroke [facility]) 1989   left sided weakness  . TIA (transient ischemic attack)   . Tobacco abuse   . Vulvar abscess 01/23/2018    Past Surgical History:  Procedure Laterality Date  . HERNIA REPAIR    . INCISION AND DRAINAGE ABSCESS N/A 01/26/2018   Procedure: INCISION AND DEBRIDEMENT OF VULVAR NECROTIZING SOFT TISSUE INFECTION;  Surgeon: Greer Pickerel, MD;  Location: [place];  Service: General;  Laterality: N/A;  . INCISION AND DRAINAGE PERIRECTAL ABSCESS N/A 01/22/2018   Procedure: IRRIGATION AND DEBRIDEMENT LABIAL/VULVAR AREA;  Surgeon: Coralie Keens, MD;  Location: [place];  Service: General;  Laterality: N/A;  . INCISION AND DRAINAGE PERIRECTAL ABSCESS N/A 01/29/2018   Procedure: IRRIGATION AND DEBRIDEMENT VULVA;  Surgeon: Excell Seltzer, MD;  Location:  [place] [facility];  Service: General;  Laterality: N/A;  . INGUINAL HERNIA REPAIR Left 04/08/2015   Procedure: OPEN LEFT INGUINAL HERNIA REPAIR WITH MESH;  Surgeon: Ralene Ok, MD;  Location: [place];  Service: General;  Laterality: Left;  . INSERTION OF MESH Left 04/08/2015   Procedure: INSERTION OF MESH;  Surgeon: Ralene Ok, MD;  Location: [place];  Service: General;  Laterality: Left;  . LAPAROSCOPY     Endometriosis  . LEFT HEART CATHETERIZATION WITH CORONARY ANGIOGRAM N/A 12/05/2013   Procedure: LEFT HEART CATHETERIZATION WITH CORONARY ANGIOGRAM;  Surgeon: Jettie Booze, MD;  Location: [facility] CATH LAB;  Service: Cardiovascular;  Laterality: N/A;  . right kidney drained    . TEE WITHOUT CARDIOVERSION N/A  11/28/2013   Procedure: TRANSESOPHAGEAL ECHOCARDIOGRAM (TEE);  Surgeon: Thayer Headings, MD;  Location: [facility] ENDOSCOPY;  Service: Cardiovascular;  Laterality: N/A;    Social History   Socioeconomic History  . Marital status: Married    Spouse name: Not on file  . Number of children: 2  . Years of education: Not on file  . Highest education level: Not on file  Occupational History  . Not on file  Tobacco Use  . Smoking status: Current Some Day Smoker    Packs/day: 0.50    Years: 36.00    Pack years: 18.00    Types: Cigarettes    Last attempt to quit: 01/18/2018    Years since quitting: 1.5  . Smokeless tobacco: Never Used  Substance and Sexual Activity  . Alcohol use: No  . Drug use: No  . Sexual activity: Not Currently  Other Topics Concern  . Not on file  Social History Narrative   ** Merged History Encounter **       Lives in [place]   Married but lives with Boyfriend - not legally separated   Disabled - for BiPolar, Seizure disorder, diabetes   Formerly worked at [employer] Strain:   . Difficulty of Paying Living Expenses: Not on file  Food Insecurity:   . Worried About Charity fundraiser in the Last Year: Not on file  . Ran Out of Food in the Last Year: Not on file  Transportation Needs:   . Lack of Transportation (Medical): Not on file  . Lack of Transportation (Non-Medical): Not on file  Physical Activity: Unknown  . Days of Exercise per Week: Patient refused  . Minutes of Exercise per Session: Patient refused  Stress: No Stress Concern Present  . Feeling of Stress : Only a little  Social Connections: Unknown  . Frequency of Communication with Friends and Family: More than three times a week  . Frequency of Social Gatherings with Friends and Family: Patient refused  . Attends Religious Services: Patient refused  . Active Member of Clubs or Organizations: Patient refused  . Attends Theatre manager Meetings: Patient refused  . Marital Status: Married  Human resources officer Violence: Unknown  . Fear of Current or Ex-Partner: Patient refused  . Emotionally Abused: Patient refused  . Physically Abused: Patient refused  . Sexually Abused: Patient refused    Current Outpatient Medications on File Prior to Visit  Medication Sig Dispense Refill  . acetaminophen (TYLENOL) 500 MG tablet Take 500 mg by mouth every 4 (four) hours as needed for moderate pain.     Marland Kitchen ALPRAZolam (XANAX) 0.5 MG tablet Take 1 tablet (0.5 mg total) by mouth 2 (  two) times daily as needed for anxiety. (Patient taking differently: Take 1 mg by mouth daily. )  0  . aspirin EC 81 MG tablet Take 81 mg by mouth every morning.    . budesonide (PULMICORT) 0.5 MG/2ML nebulizer solution Take 0.5 mg by nebulization 2 (two) times daily.    . budesonide-formoterol (SYMBICORT) 80-4.5 MCG/ACT inhaler INHALE 2 PUFFS BY MOUTH EVERY 12 HOURS TO PREVENT COUGH OR WHEEZING *RINSE MOUTH AFTER EACH USE* 10.2 g 6  . busPIRone (BUSPAR) 30 MG tablet Take 30 mg by mouth 2 (two) times daily.    Marland Kitchen doxycycline (MONODOX) 50 MG capsule Take 50 mg by mouth 2 (two) times daily.    . famotidine (PEPCID) 20 MG tablet TAKE 1 TABLET (20 MG TOTAL) BY MOUTH 2 (TWO) TIMES DAILY. FOR HEARTBURN. 180 tablet 1  . FLUoxetine (PROZAC) 40 MG capsule Take 40 mg by mouth at bedtime.     Marland Kitchen glucose blood (ONETOUCH VERIO) test strip Use to monitor glucose levels three times daily; E11.9; E10.42 100 each 12  . hydrOXYzine (ATARAX/VISTARIL) 10 MG tablet Take 1 tablet (10 mg total) by mouth 2 (two) times daily as needed for vomiting. 10 tablet 0  . Insulin Pen Needle 32G X 4 MM MISC Used to give insulin injections twice daily. 100 each 11  . ipratropium-albuterol (DUONEB) 0.5-2.5 (3) MG/3ML SOLN Take 3 mLs by nebulization every 4 (four) hours as needed (wheezing/shortness of breath). 360 mL 0  . loperamide (IMODIUM A-D) 2 MG tablet Take 4 mg by mouth 3 (three) times  daily as needed for diarrhea or loose stools.    . metolazone (ZAROXOLYN) 2.5 MG tablet Take 2.5 mg by mouth at bedtime.    . ondansetron (ZOFRAN ODT) 4 MG disintegrating tablet Take 1 tablet (4 mg total) by mouth every 8 (eight) hours as needed for nausea or vomiting. 15 tablet 0  . OneTouch Delica Lancets 99991111 MISC 1 each by Does not apply route 3 (three) times daily. Use to monitor glucose levels three times daily; E11.9; E10.42 100 each 11  . OXYGEN Inhale 4 L into the lungs at bedtime.     . potassium chloride 20 MEQ TBCR Take 20 mEq by mouth daily. 60 tablet 2  . potassium chloride SA (KLOR-CON) 20 MEQ tablet Take 20 mEq by mouth daily.    . pregabalin (LYRICA) 300 MG capsule Take 1 capsule (300 mg total) by mouth 2 (two) times daily. as needed for neuropathy. 180 capsule 0  . PROVENTIL HFA 108 (90 Base) MCG/ACT inhaler INHALE 2 PUFFS BY MOUTH INTO THE LUNGS EVERY 4 HOURS AS NEEDED FOR SHORTNESS OFBREATH 6.7 g 6  . rOPINIRole (REQUIP) 1 MG tablet TAKE 1 TABLET BY MOUTH EVERY NIGHT AT BEDTIME FOR RESTLESS LEGS. 90 tablet 1  . spironolactone (ALDACTONE) 50 MG tablet TAKE 1 TABLET BY MOUTH ONCE A DAY 90 tablet 0  . tobramycin (TOBREX) 0.3 % ophthalmic solution Place 1 drop into both eyes daily.     Marland Kitchen torsemide (DEMADEX) 20 MG tablet TAKE 1 TABLET BY MOUTH 4 TIMES DAILY 360 tablet 3  . traZODone (DESYREL) 100 MG tablet Take 100 mg by mouth daily.     Marland Kitchen ULTICARE MINI PEN NEEDLES 31G X 6 MM MISC USE AS DIRECTED THREE TIMES DAILY 100 each 0   No current facility-administered medications on file prior to visit.    Allergies  Allergen Reactions  . Adhesive [Tape] Rash and Other (See Comments)    TAKES OFF  THE SKIN (CERTAIN MEDICAL TAPES DO THIS!!)  . Metoprolol Shortness Of Breath    Occurrence of shortness of breath after 3 days  . Montelukast Shortness Of Breath  . Morphine Sulfate Anaphylaxis, Shortness Of Breath and Nausea And Vomiting    Swollen Throat - Able to tolerate dilaudid  .  Penicillins Anaphylaxis, Hives and Shortness Of Breath    Throat swells Has patient had a PCN reaction causing immediate rash, facial/tongue/throat swelling, SOB or lightheadedness with hypotension: Yes Has patient had a PCN reaction causing severe rash involving mucus membranes or skin necrosis: No Has patient had a PCN reaction that required hospitalization: Yes Has patient had a PCN reaction occurring within the last 10 years: No If all of the above answers are "NO", then may proceed with Cephalosporin use.   . Prednisone Anaphylaxis  . Diltiazem Swelling  . Gabapentin Swelling    Family History  Problem Relation Age of Onset  . Venous thrombosis Brother   . Other Brother        BRAIN TUMOR  . Asthma Father   . Diabetes Father   . Coronary artery disease Mother   . Hypertension Mother   . Diabetes Mother   . Asthma Sister   . Diabetes type II Brother     LMP 11/11/2011   Review of Systems She denies hypoglycemia.      Objective:   Physical Exam   Lab Results  Component Value Date   CREATININE 1.81 (H) 07/26/2019   BUN 54 (H) 07/26/2019   NA 127 (L) 07/26/2019   K 3.9 07/26/2019   CL 82 (L) 07/26/2019   CO2 33 (H) 07/26/2019      Assessment & Plan:  Vaginitis, recurrent Insulin-requiring type 2 DM, with TIA: she needs increased rx.  Renal insuff: This limits rx options.    Patient Instructions  check your blood sugar twice a day.  vary the time of day when you check, between before the 3 meals, and at bedtime.  also check if you have symptoms of your blood sugar being too high or too low.  please keep a record of the readings and bring it to your next appointment here (or you can bring the meter itself).  You can write it on any piece of paper.  please call us sooner if your blood sugar goes below 70, or if you have a lot of readings over 200. I have sent a prescription to your pharmacy, to re-try the [place]. Please continue the same insulin.   Please have a  follow-up appointment in 1-2 weeks.   I have sent a prescription to your pharmacy, to the yeast infection.

## 2019-08-12 NOTE — Patient Instructions (Addendum)
check your blood sugar twice a day.  vary the time of day when you check, between before the 3 meals, and at bedtime.  also check if you have symptoms of your blood sugar being too high or too low.  please keep a record of the readings and bring it to your next appointment here (or you can bring the meter itself).  You can write it on any piece of paper.  please call us sooner if your blood sugar goes below 70, or if you have a lot of readings over 200. I have sent a prescription to your pharmacy, to re-try the [place]. Please continue the same insulin.   Please have a follow-up appointment in 1-2 weeks.   I have sent a prescription to your pharmacy, to the yeast infection.

## 2019-08-13 ENCOUNTER — Ambulatory Visit: Payer: BC Managed Care – PPO | Attending: Family Medicine

## 2019-08-13 ENCOUNTER — Telehealth: Payer: Self-pay

## 2019-08-13 NOTE — Telephone Encounter (Signed)
Called pt and informed her about Dr. Ellison's response below. Verbalized acceptance and understanding. 

## 2019-08-13 NOTE — Telephone Encounter (Signed)
Please advise 

## 2019-08-13 NOTE — Telephone Encounter (Signed)
Yes, please take both this and the Ozempic

## 2019-08-13 NOTE — Telephone Encounter (Signed)
Patient calling today with questions about Humulin 500 she wants to know if she is to continue it or come off-please contact patient to advise

## 2019-08-14 ENCOUNTER — Ambulatory Visit: Payer: BC Managed Care – PPO | Admitting: Internal Medicine

## 2019-08-15 ENCOUNTER — Other Ambulatory Visit: Payer: Self-pay

## 2019-08-15 ENCOUNTER — Ambulatory Visit (INDEPENDENT_AMBULATORY_CARE_PROVIDER_SITE_OTHER): Payer: BC Managed Care – PPO

## 2019-08-15 DIAGNOSIS — I6521 Occlusion and stenosis of right carotid artery: Secondary | ICD-10-CM

## 2019-08-15 NOTE — Progress Notes (Signed)
Subjective:   [name], female    DOB: [birth year], 49 y.o.   MRN: [id number]   I connected with the patient on 08/16/2019 by a video enabled telemedicine application and verified that I am speaking with the correct person using two identifiers.     I discussed the limitations of evaluation and management by telemedicine and the availability of in person appointments. The patient expressed understanding and agreed to proceed.   This visit type was conducted due to national recommendations for restrictions regarding the COVID-19 Pandemic (e.g. social distancing).  This format is felt to be most appropriate for this patient at this time.  All issues noted in this document were discussed and addressed.  No physical exam was performed (except for noted visual exam findings with Tele health visits).  The patient has consented to conduct a Tele health visit and understands insurance will be billed.   Chief complaint:  Leg edema   HPI  49 y/o Caucasian female with hypertension, type 2 DM, morbid obesity,  HFpEF,  tobacco dependence.  Patient walks about 500 steps with a cane, before having to sit in wheelchair due to unsteady gait.  She has stable, mild exertional dyspnea.  Leg swelling is significantly improved.  Her diabetes remains extremely uncontrolled.  She recently had a blister on her finger for which she is seeing a wound care clinic.  She thinks that this is driven her blood sugars up.  She is monitoring her salt intake.  Recent labs reviewed with the patient.   Current Outpatient Medications on File Prior to Visit  Medication Sig Dispense Refill  . acetaminophen (TYLENOL) 500 MG tablet Take 500 mg by mouth every 4 (four) hours as needed for moderate pain.     Marland Kitchen ALPRAZolam (XANAX) 0.5 MG tablet Take 1 tablet (0.5 mg total) by mouth 2 (two) times daily as needed for anxiety. (Patient taking differently: Take 1 mg by mouth daily. )  0  . aspirin EC 81 MG tablet Take 81 mg by mouth  every morning.    . budesonide (PULMICORT) 0.5 MG/2ML nebulizer solution Take 0.5 mg by nebulization 2 (two) times daily.    . busPIRone (BUSPAR) 30 MG tablet Take 30 mg by mouth 2 (two) times daily.    . famotidine (PEPCID) 20 MG tablet TAKE 1 TABLET (20 MG TOTAL) BY MOUTH 2 (TWO) TIMES DAILY. FOR HEARTBURN. 180 tablet 1  . FLUoxetine (PROZAC) 40 MG capsule Take 40 mg by mouth at bedtime.     Marland Kitchen glucose blood (ONETOUCH VERIO) test strip Use to monitor glucose levels three times daily; E11.9; E10.42 100 each 12  . Insulin Pen Needle 32G X 4 MM MISC Used to give insulin injections twice daily. 100 each 11  . insulin regular human CONCENTRATED (HUMULIN R U-500 KWIKPEN) 500 UNIT/ML kwikpen Inject 400 Units into the skin 2 (two) times daily with a meal. 14 pen 11  . ipratropium-albuterol (DUONEB) 0.5-2.5 (3) MG/3ML SOLN Take 3 mLs by nebulization every 4 (four) hours as needed (wheezing/shortness of breath). 360 mL 0  . metolazone (ZAROXOLYN) 2.5 MG tablet Take 2.5 mg by mouth at bedtime.    Glory Rosebush Delica Lancets 82N MISC 1 each by Does not apply route 3 (three) times daily. Use to monitor glucose levels three times daily; E11.9; E10.42 100 each 11  . OXYGEN Inhale 4 L into the lungs at bedtime.     . potassium chloride 20 MEQ TBCR Take 20 mEq by  mouth daily. 60 tablet 2  . pregabalin (LYRICA) 300 MG capsule Take 1 capsule (300 mg total) by mouth 2 (two) times daily. as needed for neuropathy. 180 capsule 0  . rOPINIRole (REQUIP) 1 MG tablet TAKE 1 TABLET BY MOUTH EVERY NIGHT AT BEDTIME FOR RESTLESS LEGS. 90 tablet 1  . Semaglutide,0.25 or 0.5MG/DOS, (OZEMPIC, 0.25 OR 0.5 MG/DOSE,) 2 MG/1.5ML SOPN Inject 0.25 mg into the skin once a week. 1 pen 5  . spironolactone (ALDACTONE) 50 MG tablet TAKE 1 TABLET BY MOUTH ONCE A DAY 90 tablet 0  . tobramycin (TOBREX) 0.3 % ophthalmic solution Place 1 drop into both eyes daily.     Marland Kitchen torsemide (DEMADEX) 20 MG tablet TAKE 1 TABLET BY MOUTH 4 TIMES DAILY 360  tablet 3  . traZODone (DESYREL) 100 MG tablet Take 100 mg by mouth daily.     Marland Kitchen ULTICARE MINI PEN NEEDLES 31G X 6 MM MISC USE AS DIRECTED THREE TIMES DAILY 100 each 0  . budesonide-formoterol (SYMBICORT) 80-4.5 MCG/ACT inhaler INHALE 2 PUFFS BY MOUTH EVERY 12 HOURS TO PREVENT COUGH OR WHEEZING *RINSE MOUTH AFTER EACH USE* (Patient not taking: Reported on 08/16/2019) 10.2 g 6  . doxycycline (MONODOX) 50 MG capsule Take 50 mg by mouth 2 (two) times daily.     No current facility-administered medications on file prior to visit.    Cardiovascular studies:  Echocardiogram 09/14/2018:  1. The left ventricle has normal systolic function, with an ejection fraction of 55-60%. The cavity size was normal. Left ventricular diastology could not be evaluated.  2. The right ventricle has normal systolic function. The cavity was normal. There is no increase in right ventricular wall thickness.  3. Left atrial size was not well visualized.  4. The mitral valve is normal in structure. Mild thickening of the mitral valve leaflet.  5. The tricuspid valve is not well visualized. Tricuspid valve regurgitation was not assessed by color flow Doppler.  6. The aortic valve was not well visualized Aortic valve regurgitation was not assessed by color flow Doppler.  7. The interatrial septum was not well visualized.  Carotid artery duplex 06/26/2018: Stenosis in the right internal carotid artery (50-69%). ICA/CCA ratio of 3.41 suggests upper end of spectrum stenosis of 69%. Mild heterogeneous plaque left. Right vertebral artery is not visualized. Antegrade left vertebral artery flow. Compared to the study done on 12/06/2017, right ICA stenosis was greater than 70%.  Right vertebral artery flows antegrade.  Not well visualized in the present study. Follow up in six months is appropriate if clinically indicated.  Lexiscan Cardilate stress test 03/30/2016:  Normal LVEF, 55-65%.  No evidence of ischemia.  Echocardiogram  11/16/2015:  Dynamic LVEF, 65-70% with pseudo-normal filling pattern.  Mild to moderately calcified aortic valve, trace aortic stenosis with peak gradient 23, mean gradient 11 mmHg.  Recent labs: 07/26/2019: Glucose 441, BUN/Cr 54/1.81. EGFR 29. Na/K 127/3.9.  H/H 14/42. MCV 87. Platelets 204  03/2019: TSH 0.7 normal  09/2018: Chol 146, TG 135, HDL 21, LDL 98      Review of Systems  Cardiovascular: Positive for dyspnea on exertion (Stable). Negative for chest pain, leg swelling, palpitations and syncope.         Vitals:   08/16/19 0839  BP: 126/69  Pulse: 80    Objective:    Physical Exam  Constitutional: She is oriented to person, place, and time. She appears well-developed and well-nourished. No distress.  Pulmonary/Chest: Effort normal.  Musculoskeletal:  General: Edema (Trace bilateral) present.  Neurological: She is alert and oriented to person, place, and time.  Psychiatric: She has a normal mood and affect.  Nursing note and vitals reviewed.         Assessment & Recommendations:   49 y/o Caucasian female with hypertension, type 2 DM, morbid obesity,  HFpEF,  tobacco abuse.  HFpEF: Continue diuretics, currently on torsemide 40 mg twice daily.  Given her worsening renal function, I have stopped her metolazone.  Spironolactone may also need to be stopped in the future.  Her GFR is just under 30.  Needs regular monitoring of her BMP with PCP and consideration for stopping spironolactone, should her GFR stay under 30.   Hypertension: Controlled.   Tobacco abuse: Reemphasized tobacco cessation.  Type 2 DM: Uncontrolled. Continue Aspirin.  Given her moderate carotid stenosis, added Crestor 10 mg daily.  Repeat carotid ultrasound in 6 months.  Office follow-up after that.   Nigel Mormon, MD [facility] Pager: [phone] Office: [phone]

## 2019-08-16 ENCOUNTER — Telehealth: Payer: BC Managed Care – PPO | Admitting: Cardiology

## 2019-08-16 ENCOUNTER — Encounter: Payer: Self-pay | Admitting: Cardiology

## 2019-08-16 VITALS — BP 126/69 | HR 80 | Ht 64.0 in | Wt 272.0 lb

## 2019-08-16 DIAGNOSIS — I5032 Chronic diastolic (congestive) heart failure: Secondary | ICD-10-CM

## 2019-08-16 DIAGNOSIS — I6521 Occlusion and stenosis of right carotid artery: Secondary | ICD-10-CM

## 2019-08-16 MED ORDER — ROSUVASTATIN CALCIUM 10 MG PO TABS: 10.0000 mg | ORAL_TABLET | Freq: Every day | ORAL | 3 refills | Status: DC

## 2019-08-19 ENCOUNTER — Other Ambulatory Visit: Payer: Self-pay

## 2019-08-19 ENCOUNTER — Telehealth: Payer: Self-pay | Admitting: Endocrinology

## 2019-08-19 DIAGNOSIS — E1165 Type 2 diabetes mellitus with hyperglycemia: Secondary | ICD-10-CM

## 2019-08-19 MED ORDER — HUMULIN R U-500 (CONCENTRATED) 500 UNIT/ML ~~LOC~~ SOLN: 400.0000 [IU] | Freq: Two times a day (BID) | SUBCUTANEOUS | 2 refills | Status: DC

## 2019-08-19 MED ORDER — "INSULIN SYRINGE 30G X 1/2"" 1 ML MISC": 1.0000 | Freq: Two times a day (BID) | 0 refills | Status: DC

## 2019-08-19 NOTE — Telephone Encounter (Signed)
Outpatient Medication Detail   Disp Refills Start End   insulin regular human CONCENTRATED (HUMULIN R) 500 UNIT/ML injection 48 mL 2 08/19/2019    Sig - Route: Inject 0.8 mLs (400 Units total) into the skin 2 (two) times daily with a meal. DOSAGE IS CORRECT. PLEASE FILL AS PRESCRIBED - Subcutaneous   Sent to pharmacy as: insulin regular human CONCENTRATED (HUMULIN R) 500 UNIT/ML injection   E-Prescribing Status: Receipt confirmed by pharmacy (08/19/2019 11:05 AM EST)    Outpatient Medication Detail   Disp Refills Start End   Insulin Syringe-Needle U-100 (INSULIN SYRINGE 1CC/30GX1/2") 30G X 1/2" 1 ML MISC 200 each 0 08/19/2019    Sig - Route: 1 each by Does not apply route 2 (two) times daily. E11.9 - Does not apply   Sent to pharmacy as: Insulin Syringe-Needle U-100 (INSULIN SYRINGE 1CC/30GX1/2") 30G X 1/2" 1 ML Misc   E-Prescribing Status: Receipt confirmed by pharmacy (08/19/2019 11:05 AM EST)    Pharmacy  [place], [place] [address]  [place], [place] [address]  Phone:  [phone]  Fax:  [phone]  DEA #:  --

## 2019-08-19 NOTE — Telephone Encounter (Signed)
Chris with [facility] - [place], [place] - [place]  Ph# [phone] called to request clarification of dosage for Humulin R U500. Also patient told Gerald Stabs that patient prefers the vials for the medication listed above. Also, patient will need syringes for the U500 I ml. Patient told Gerald Stabs that dosage of the above medication should be 400 units twice daily.

## 2019-08-20 ENCOUNTER — Other Ambulatory Visit: Payer: BC Managed Care – PPO

## 2019-08-21 ENCOUNTER — Other Ambulatory Visit: Payer: Self-pay

## 2019-08-21 ENCOUNTER — Encounter: Payer: BC Managed Care – PPO | Attending: Internal Medicine | Admitting: Internal Medicine

## 2019-08-21 DIAGNOSIS — L98492 Non-pressure chronic ulcer of skin of other sites with fat layer exposed: Secondary | ICD-10-CM | POA: Insufficient documentation

## 2019-08-21 DIAGNOSIS — S61202A Unspecified open wound of right middle finger without damage to nail, initial encounter: Secondary | ICD-10-CM | POA: Diagnosis not present

## 2019-08-21 DIAGNOSIS — E11622 Type 2 diabetes mellitus with other skin ulcer: Secondary | ICD-10-CM | POA: Insufficient documentation

## 2019-08-21 DIAGNOSIS — M79644 Pain in right finger(s): Secondary | ICD-10-CM | POA: Diagnosis not present

## 2019-08-21 DIAGNOSIS — B9561 Methicillin susceptible Staphylococcus aureus infection as the cause of diseases classified elsewhere: Secondary | ICD-10-CM | POA: Diagnosis not present

## 2019-08-21 DIAGNOSIS — Z880 Allergy status to penicillin: Secondary | ICD-10-CM | POA: Diagnosis not present

## 2019-08-22 ENCOUNTER — Other Ambulatory Visit
Admission: RE | Admit: 2019-08-22 | Discharge: 2019-08-22 | Disposition: A | Discharge status: Home / Self Care | Payer: BC Managed Care – PPO | Source: Ambulatory Visit | Attending: Internal Medicine | Admitting: Internal Medicine

## 2019-08-22 DIAGNOSIS — L089 Local infection of the skin and subcutaneous tissue, unspecified: Secondary | ICD-10-CM | POA: Insufficient documentation

## 2019-08-22 NOTE — Progress Notes (Signed)
[name], [name] Visit Report for 08/21/2019 Debridement Details Patient Name: [name], [name] Date of Service: 08/21/2019 9:15 AM Medical Record Number: [id number] Patient Account Number: [id number] Date of Birth/Sex: 10-Feb-1971 (49 y.o. F) Treating RN: Cornell Barman Primary Care Provider: Alma Friendly Other Clinician: Referring Provider: Alma Friendly Treating Provider/Extender: Tito Dine in Treatment: 9 Debridement Performed for Wound #5 Right Hand - 3rd Digit Assessment: Performed By: Physician Ricard Dillon, MD Debridement Type: Debridement Level of Consciousness (Pre- Awake and Alert procedure): Pre-procedure Verification/Time Yes - 09:40 Out Taken: Start Time: 09:42 Pain Control: Lidocaine Total Area Debrided (L x W): 0.3 (cm) x 0.3 (cm) = 0.09 (cm) Tissue and other material Viable, Non-Viable, Slough, Subcutaneous, Slough debrided: Level: Skin/Subcutaneous Tissue Debridement Description: Excisional Instrument: Curette Specimen: Swab, Number of Specimens Taken: 1 Bleeding: Minimum Hemostasis Achieved: Pressure End Time: 09:44 Response to Treatment: Procedure was tolerated well Level of Consciousness Awake and Alert (Post-procedure): Post Debridement Measurements of Total Wound Length: (cm) 0.3 Width: (cm) 0.3 Depth: (cm) 0.2 Volume: (cm) 0.014 Character of Wound/Ulcer Post Debridement: Stable Post Procedure Diagnosis Same as Pre-procedure Electronic Signature(s) Signed: 08/21/2019 5:43:36 PM By: Linton Ham MD Signed: 08/21/2019 5:56:57 PM By: Gretta Cool, BSN, RN, CWS, Kim RN, BSN Entered By: Linton Ham on 08/21/2019 09:57:45 Yung, [name] -------------------------------------------------------------------------------- HPI Details Patient Name: [name] Date of Service: 08/21/2019 9:15 AM Medical Record Number: [id number] Patient Account Number: [id number] Date of Birth/Sex: 09/01/1970 (49 y.o.  F) Treating RN: Cornell Barman Primary Care Provider: Alma Friendly Other Clinician: Referring Provider: Alma Friendly Treating Provider/Extender: Tito Dine in Treatment: 9 History of Present Illness HPI Description: ADMISSION 06/19/2019 Patient is a 49 year old woman who is accompanied by her husband. She has severe diabetic peripheral neuropathy which is left her minimally ambulatory. She spends most of the time in a wheelchair. She also has a history of chronic lower extremity edema history of left leg DVT. She states that on 2 separate occasions roughly a month ago she fell with lacerations on the bilateral anterior tibial areas. She was seen by primary care on 06/04/2019 diagnosed with cellulitis of the left leg put on Bactrim DS. Also noted to have hand wounds with a question of referral to hand surgery. She was also sent to our clinic at the same time. The patient's husband is doing the dressings. He has been using silver alginate that was supplied by home health. He is done a really good job the area on the left leg is actually healed only a small superficial area on the right Somewhere in the in the last 2 weeks she gives a bizarre history of going to wash nail polish off the fingers of both hands. She apparently did not realize that her son had put Clorox in the vinegar jar that they used to clean the countertop. She ended up burning her hands. The patient has a wound on the PIP of the left third finger, PIP of the right second finger and the lateral part of the right third finger Past medical history includes COPD, obstructive sleep apnea on nocturnal O2, type 2 diabetes with peripheral neuropathy, retinopathy, chronic diastolic heart failure coronary artery disease, severe diabetic neuropathy type 2 diabetes apparently diagnosed at age 50 on insulin, COPD, continued tobacco abuse, left leg deep DVT, hypertension, TIAs, seizure disorders, asthma The patient is  actually had arterial studies in February of this year. ABIs bilaterally were 1.02 and TBI's bilaterally at 0.6. Waveforms were triphasic and biphasic. She was  not felt to have significant arterial disease. 12/23; patient readmitted the clinic last week. She has predominantly venous insufficiency wounds on the right medial lower leg. She also had open areas on the fingers of both hands which happened in a very bizarre way. She has almost complete healing on the right medial calf. The area on the left third finger is healed. She still has 2 open wounds on the medial aspect of the PIP of the right second finger and the dorsal aspect of the right PIP of the third finger. Of these 2 wounds the PIP of the third finger wound is painful and deeper 12/30; the areas on her right medial calf are closed. She likely has some degree of chronic venous insufficiency and lymphedema. Also very xerotic skin which I have recommended a moisturizing. I did not bring up the issue of compression stockings The areas we are still looking out are on the PIP of the right third finger and the medial part of the PIP of the right second finger. The second finger area is just about closed. The problem is over the PIP of the right third finger. This does not look infected and she is completing her antibiotics from last week. However this is a deep wound at the base of this there is either tendon or periosteum. She she has some healthy granulation but we are not seeing that close over the bottom part of the wound. We are probably going to need to immobilize the finger somewhat so that she does not continuously pull the tissue apart 07/17/2019; right medial calf remains closed and the right second finger is closed. The sole problem here is an area over the PIP of the third finger. These wounds were initially advertised as burn injuries that happened in a very bizarre way [please see my initial discussion on this]. I gave her  doxycycline last week because of some erythema around the wound however she did not tolerate this very well because of nausea. She stopped this within the last day or 2. we have been using silver collagen 1/13; the only wound we are looking at is the right third finger over the PIP. We are using endoform. X-ray ordered last time showed no underlying bone abnormalities 1/20; right third finger over the PIP. Still debris at the surface we have been using endoform. We are making some improvements in surface area and volume but still requiring debridement. [name], [name] 1/27; right third finger over the PIP. Still debris on the surface of the wound and maceration. We have been using endoform. This wound initially went to the periosteum and it certainly is off that but is certainly stalled over the last 2 or 3 weeks 2/10; right third finger over the PIP. We switch to Iodoflex last time but she says this caused extreme erythema and blistering and they changed back to endoform. This is a wound that originally went to the periosteum. This is improved since her admission but really it is stalled. She missed her appointment last week but she states she has been using endoform. She complains of increasing pain in the finger that is making it difficult for her to sleep at times Electronic Signature(s) Signed: 08/21/2019 5:43:36 PM By: Linton Ham MD Entered By: Linton Ham on 08/21/2019 09:59:29 [name], [name] -------------------------------------------------------------------------------- Physical Exam Details Patient Name: [name] Date of Service: 08/21/2019 9:15 AM Medical Record Number: [id number] Patient Account Number: [id number] Date of Birth/Sex: August 12, 1970 (49 y.o. F) Treating RN: Gretta Cool,  Kim Primary Care Provider: Alma Friendly Other Clinician: Referring Provider: Alma Friendly Treating Provider/Extender: Tito Dine in Treatment:  9 Constitutional Patient is hypertensive.. Pulse regular and within target range for patient.Marland Kitchen Respirations regular, non-labored and within target range.. Temperature is normal and within the target range for the patient.Marland Kitchen appears in no distress. Musculoskeletal There is no tenderness of the underlying joint no obvious effusion no obvious erythema. Notes Wound exam; wound over the dorsal PIP of the right third finger. This does not look much different from 2 weeks ago. Once again debridement of overlying skin and some subcutaneous debris. I am able to get to a better looking granulated surface. There is bleeding controlled with direct pressure. This does not go to bone. Electronic Signature(s) Signed: 08/21/2019 5:43:36 PM By: Linton Ham MD Entered By: Linton Ham on 08/21/2019 10:01:04 [name] -------------------------------------------------------------------------------- Physician Orders Details Patient Name: [name] Date of Service: 08/21/2019 9:15 AM Medical Record Number: [id number] Patient Account Number: [id number] Date of Birth/Sex: Oct 21, 1970 (49 y.o. F) Treating RN: Army Melia Primary Care Provider: Alma Friendly Other Clinician: Referring Provider: Alma Friendly Treating Provider/Extender: Tito Dine in Treatment: 9 Verbal / Phone Orders: No Diagnosis Coding Wound Cleansing Wound #5 Right Hand - 3rd Digit o Clean wound with Normal Saline. o May Shower, gently pat wound dry prior to applying new dressing. Anesthetic (add to Medication List) Wound #5 Right Hand - 3rd Digit o Topical Lidocaine 4% cream applied to wound bed prior to debridement (In Clinic Only). Primary Wound Dressing Wound #5 Right Hand - 3rd Digit o Other: - Endoform moistened with saline Secondary Dressing Wound #5 Right Hand - 3rd Digit o Non-adherent pad - Splint secured with Conform and tape Dressing Change Frequency Wound #5 Right  Hand - 3rd Digit o Change Dressing Monday, Wednesday, Friday Follow-up Appointments Wound #5 Right Hand - 3rd Digit o Return Appointment in 1 week. Additional Orders / Instructions Wound #5 Right Hand - 3rd Digit o Other: - Keep finger as straight as possible. Home Health Wound #5 Right Hand - 3rd Digit o Continue Home Health Visits - [place] Nurse may visit PRN to address patientos wound care needs. o FACE TO FACE ENCOUNTER: MEDICARE and MEDICAID PATIENTS: I certify that this patient is under my care and that I had a face-to-face encounter that meets the physician face-to-face encounter requirements with this patient on this date. The encounter with the patient was in whole or in part for the following MEDICAL CONDITION: (primary reason for [place]) MEDICAL NECESSITY: I certify, that based on my findings, NURSING services are a medically necessary home health service. HOME BOUND STATUS: I certify that my clinical findings support that this patient is homebound (i.e., Due to illness or injury, pt requires aid of supportive devices such as crutches, cane, wheelchairs, walkers, the use of special transportation or the assistance of another person to leave their place of residence. There is a normal inability to leave the home [place], [facility] ([id number]) and doing so requires considerable and taxing effort. Other absences are for medical reasons / religious services and are infrequent or of short duration when for other reasons). o If current dressing causes regression in wound condition, may D/C ordered dressing product/s and apply Normal Saline Moist Dressing daily until next [place] / Other MD appointment. [place] of regression in wound condition at [phone]. o Please direct any NON-WOUND related issues/requests for orders to patient's Primary  Care Physician Laboratory o Bacteria identified in Wound by  Culture (MICRO) - right 3rd finger oooo LOINC Code: [id number] oooo Convenience Name: Wound culture routine Radiology o X-ray, hand - 3rd finger Electronic Signature(s) Signed: 08/21/2019 11:18:27 AM By: Army Melia Signed: 08/21/2019 5:43:36 PM By: Linton Ham MD Entered By: Army Melia on 08/21/2019 09:44:51 [name], [name] ([id number]) -------------------------------------------------------------------------------- Problem List Details Patient Name: [name] Date of Service: 08/21/2019 9:15 AM Medical Record Number: [id number] Patient Account Number: [id number] Date of Birth/Sex: 1971/03/02 (49 y.o. F) Treating RN: Cornell Barman Primary Care Provider: Alma Friendly Other Clinician: Referring Provider: Alma Friendly Treating Provider/Extender: Tito Dine in Treatment: 9 Active Problems ICD-10 Evaluated Encounter Code Description Active Date Today Diagnosis S61.401D Unspecified open wound of right hand, subsequent encounter 06/19/2019 No Yes E11.622 Type 2 diabetes mellitus with other skin ulcer 06/19/2019 No Yes Inactive Problems ICD-10 Code Description Active Date Inactive Date L97.211 Non-pressure chronic ulcer of right calf limited to breakdown of skin 06/19/2019 06/19/2019 L97.821 Non-pressure chronic ulcer of other part of left lower leg limited to 06/19/2019 06/19/2019 breakdown of skin I89.0 Lymphedema, not elsewhere classified 06/19/2019 06/19/2019 S61.402D Unspecified open wound of left hand, subsequent encounter 06/19/2019 06/19/2019 Resolved Problems Electronic Signature(s) Signed: 08/21/2019 5:43:36 PM By: Linton Ham MD Entered By: Linton Ham on 08/21/2019 09:57:24 [name], [name] ([id number]) -------------------------------------------------------------------------------- Progress Note Details Patient Name: [name] Date of Service: 08/21/2019 9:15 AM Medical Record Number: [id number] Patient Account Number: [id number] Date of  Birth/Sex: [birth year] (49 y.o. F) Treating RN: Cornell Barman Primary Care Provider: Alma Friendly Other Clinician: Referring Provider: Alma Friendly Treating Provider/Extender: Tito Dine in Treatment: 9 Subjective History of Present Illness (HPI) ADMISSION 06/19/2019 Patient is a 49 year old woman who is accompanied by her husband. She has severe diabetic peripheral neuropathy which is left her minimally ambulatory. She spends most of the time in a wheelchair. She also has a history of chronic lower extremity edema history of left leg DVT. She states that on 2 separate occasions roughly a month ago she fell with lacerations on the bilateral anterior tibial areas. She was seen by primary care on 06/04/2019 diagnosed with cellulitis of the left leg put on Bactrim DS. Also noted to have hand wounds with a question of referral to hand surgery. She was also sent to our clinic at the same time. The patient's husband is doing the dressings. He has been using silver alginate that was supplied by home health. He is done a really good job the area on the left leg is actually healed only a small superficial area on the right Somewhere in the in the last 2 weeks she gives a bizarre history of going to wash nail polish off the fingers of both hands. She apparently did not realize that her son had put Clorox in the vinegar jar that they used to clean the countertop. She ended up burning her hands. The patient has a wound on the PIP of the left third finger, PIP of the right second finger and the lateral part of the right third finger Past medical history includes COPD, obstructive sleep apnea on nocturnal O2, type 2 diabetes with peripheral neuropathy, retinopathy, chronic diastolic heart failure coronary artery disease, severe diabetic neuropathy type 2 diabetes apparently diagnosed at age 6 on insulin, COPD, continued tobacco abuse, left leg deep DVT, hypertension, TIAs, seizure  disorders, asthma The patient is actually had arterial studies in February of this year. ABIs bilaterally were 1.02 and TBI's bilaterally at 0.6.  Waveforms were triphasic and biphasic. She was not felt to have significant arterial disease. 12/23; patient readmitted the clinic last week. She has predominantly venous insufficiency wounds on the right medial lower leg. She also had open areas on the fingers of both hands which happened in a very bizarre way. She has almost complete healing on the right medial calf. The area on the left third finger is healed. She still has 2 open wounds on the medial aspect of the PIP of the right second finger and the dorsal aspect of the right PIP of the third finger. Of these 2 wounds the PIP of the third finger wound is painful and deeper 12/30; the areas on her right medial calf are closed. She likely has some degree of chronic venous insufficiency and lymphedema. Also very xerotic skin which I have recommended a moisturizing. I did not bring up the issue of compression stockings The areas we are still looking out are on the PIP of the right third finger and the medial part of the PIP of the right second finger. The second finger area is just about closed. The problem is over the PIP of the right third finger. This does not look infected and she is completing her antibiotics from last week. However this is a deep wound at the base of this there is either tendon or periosteum. She she has some healthy granulation but we are not seeing that close over the bottom part of the wound. We are probably going to need to immobilize the finger somewhat so that she does not continuously pull the tissue apart 07/17/2019; right medial calf remains closed and the right second finger is closed. The sole problem here is an area over the PIP of the third finger. These wounds were initially advertised as burn injuries that happened in a very bizarre way [please see my initial  discussion on this]. I gave her doxycycline last week because of some erythema around the wound however she did not tolerate this very well because of nausea. She stopped this within the last day or 2. we have been using silver collagen 1/13; the only wound we are looking at is the right third finger over the PIP. We are using endoform. X-ray ordered last time showed no underlying bone abnormalities 1/20; right third finger over the PIP. Still debris at the surface we have been using endoform. We are making some [name], [name] improvements in surface area and volume but still requiring debridement. 1/27; right third finger over the PIP. Still debris on the surface of the wound and maceration. We have been using endoform. This wound initially went to the periosteum and it certainly is off that but is certainly stalled over the last 2 or 3 weeks 2/10; right third finger over the PIP. We switch to Iodoflex last time but she says this caused extreme erythema and blistering and they changed back to endoform. This is a wound that originally went to the periosteum. This is improved since her admission but really it is stalled. She missed her appointment last week but she states she has been using endoform. She complains of increasing pain in the finger that is making it difficult for her to sleep at times Objective Constitutional Patient is hypertensive.. Pulse regular and within target range for patient.Marland Kitchen Respirations regular, non-labored and within target range.. Temperature is normal and within the target range for the patient.Marland Kitchen appears in no distress. Vitals Time Taken: 9:26 AM, Height: 64 in, Weight: 286  lbs, BMI: 49.1, Temperature: 97.9 F, Pulse: 86 bpm, Respiratory Rate: 16 breaths/min, Blood Pressure: 148/62 mmHg. Musculoskeletal There is no tenderness of the underlying joint no obvious effusion no obvious erythema. General Notes: Wound exam; wound over the dorsal PIP of the right  third finger. This does not look much different from 2 weeks ago. Once again debridement of overlying skin and some subcutaneous debris. I am able to get to a better looking granulated surface. There is bleeding controlled with direct pressure. This does not go to bone. Integumentary (Hair, Skin) Wound #5 status is Open. Original cause of wound was Trauma. The wound is located on the Right Hand - 3rd Digit. The wound measures 0.3cm length x 0.3cm width x 0.2cm depth; 0.071cm^2 area and 0.014cm^3 volume. There is Fat Layer (Subcutaneous Tissue) Exposed exposed. There is a medium amount of sanguinous drainage noted. The wound margin is thickened. There is small (1-33%) pink granulation within the wound bed. There is a large (67-100%) amount of necrotic tissue within the wound bed including Adherent Slough. Assessment Active Problems ICD-10 Unspecified open wound of right hand, subsequent encounter Type 2 diabetes mellitus with other skin ulcer Procedures [name], [name] Wound #5 Pre-procedure diagnosis of Wound #5 is a Trauma, Other located on the Right Hand - 3rd Digit . There was a Excisional Skin/Subcutaneous Tissue Debridement with a total area of 0.09 sq cm performed by Ricard Dillon, MD. With the following instrument(s): Curette to remove Viable and Non-Viable tissue/material. Material removed includes Subcutaneous Tissue and Slough and after achieving pain control using Lidocaine. 1 specimen was taken by a Swab and sent to the lab per facility protocol. A time out was conducted at 09:40, prior to the start of the procedure. A Minimum amount of bleeding was controlled with Pressure. The procedure was tolerated well. Post Debridement Measurements: 0.3cm length x 0.3cm width x 0.2cm depth; 0.014cm^3 volume. Character of Wound/Ulcer Post Debridement is stable. Post procedure Diagnosis Wound #5: Same as Pre-Procedure Plan Wound Cleansing: Wound #5 Right Hand - 3rd  Digit: Clean wound with Normal Saline. May Shower, gently pat wound dry prior to applying new dressing. Anesthetic (add to Medication List): Wound #5 Right Hand - 3rd Digit: Topical Lidocaine 4% cream applied to wound bed prior to debridement (In Clinic Only). Primary Wound Dressing: Wound #5 Right Hand - 3rd Digit: Other: - Endoform moistened with saline Secondary Dressing: Wound #5 Right Hand - 3rd Digit: Non-adherent pad - Splint secured with Conform and tape Dressing Change Frequency: Wound #5 Right Hand - 3rd Digit: Change Dressing Monday, Wednesday, Friday Follow-up Appointments: Wound #5 Right Hand - 3rd Digit: Return Appointment in 1 week. Additional Orders / Instructions: Wound #5 Right Hand - 3rd Digit: Other: - Keep finger as straight as possible. Home Health: Wound #5 Right Hand - 3rd Digit: [place] Nurse may visit PRN to address patient s wound care needs. FACE TO FACE ENCOUNTER: MEDICARE and MEDICAID PATIENTS: I certify that this patient is under my care and that I had a face-to-face encounter that meets the physician face-to-face encounter requirements with this patient on this date. The encounter with the patient was in whole or in part for the following MEDICAL CONDITION: (primary reason for [place]) MEDICAL NECESSITY: I certify, that based on my findings, NURSING services are a medically necessary home health service. HOME BOUND STATUS: I certify that my clinical findings support that this patient is homebound (i.e., Due to illness  or injury, pt requires aid of supportive devices such as crutches, cane, wheelchairs, walkers, the use of special transportation or the assistance of another person to leave their place of residence. There is a normal inability to leave the home and doing so requires considerable and taxing effort. Other absences are for medical reasons / religious services and are infrequent or  of short duration when for other reasons). If current dressing causes regression in wound condition, may D/C ordered dressing product/s and apply Normal Saline Moist Dressing daily until next [place] / Other MD appointment. [place] of regression in wound condition at [phone]. Please direct any NON-WOUND related issues/requests for orders to patient's Primary Care Physician Laboratory ordered were: [name], [name] Wound culture routine - right 3rd finger Radiology ordered were: X-ray, hand - 3rd finger 1. At this point this is a very small orifice but with some depth. She did not tolerate the Iodoflexo Allergic reaction. I elected to continue with the endoform 2. She complains of increasing pain that there does not seem to be a lot of objective evidence of infection. Certainly no evidence of the joint underneath being involved no tenderness or effusion in the finger in the PIP or MCP. Because of these complaints I will redo the x-ray and I did a culture after debridement. No empiric antibiotics 3. We will see her back next week Electronic Signature(s) Signed: 08/21/2019 5:43:36 PM By: Linton Ham MD Entered By: Linton Ham on 08/21/2019 10:03:25 [name], [name] -------------------------------------------------------------------------------- SuperBill Details Patient Name: [name] Date of Service: 08/21/2019 Medical Record Number: [id number] Patient Account Number: [id number] Date of Birth/Sex: [birth year] (49 y.o. F) Treating RN: Cornell Barman Primary Care Provider: Alma Friendly Other Clinician: Referring Provider: Alma Friendly Treating Provider/Extender: Tito Dine in Treatment: 9 Diagnosis Coding ICD-10 Codes Code Description [id number] Unspecified open wound of right hand, subsequent encounter E11.622 Type 2 diabetes mellitus with other skin ulcer Facility Procedures CPT4 Code:  [id number] Description: [id number] - DEB SUBQ TISSUE 20 SQ CM/< ICD-10 Diagnosis Description S61.401D Unspecified open wound of right hand, subsequent encou E11.622 Type 2 diabetes mellitus with other skin ulcer Modifier: nter Quantity: 1 Physician Procedures CPT4 Code: [id number] Description: [id number] - WC PHYS SUBQ TISS 20 SQ CM ICD-10 Diagnosis Description S61.401D Unspecified open wound of right hand, subsequent encou E11.622 Type 2 diabetes mellitus with other skin ulcer Modifier: nter Quantity: 1 Electronic Signature(s) Signed: 08/21/2019 5:43:36 PM By: Linton Ham MD Entered By: Linton Ham on 08/21/2019 10:03:40

## 2019-08-22 NOTE — Progress Notes (Signed)
[name] Visit Report for 08/21/2019 Arrival Information Details Patient Name: [name], [name] Date of Service: 08/21/2019 9:15 AM Medical Record Number: [id number] Patient Account Number: [id number] Date of Birth/Sex: 12/25/1970 (49 y.o. F) Treating RN: Army Melia Primary Care Callin Ashe: Alma Friendly Other Clinician: Referring Everett Ricciardelli: Alma Friendly Treating Karia Ehresman/Extender: Tito Dine in Treatment: 9 Visit Information History Since Last Visit Added or deleted any medications: No Patient Arrived: Wheel Chair Any new allergies or adverse reactions: No Arrival Time: 09:25 Had a fall or experienced change in No Accompanied By: husband activities of daily living that may affect Transfer Assistance: None risk of falls: Patient Identification Verified: Yes Signs or symptoms of abuse/neglect since last visito No Patient Has Alerts: Yes Hospitalized since last visit: No Patient Alerts: ABI L/R 1.02 TBI L/R 0.6 Has Dressing in Place as Prescribed: Yes Pain Present Now: Yes Electronic Signature(s) Signed: 08/21/2019 11:18:27 AM By: Army Melia Entered By: Army Melia on 08/21/2019 09:25:51 [name], [name] -------------------------------------------------------------------------------- Encounter Discharge Information Details Patient Name: [name] Date of Service: 08/21/2019 9:15 AM Medical Record Number: [id number] Patient Account Number: [id number] Date of Birth/Sex: 1971-02-17 (49 y.o. F) Treating RN: Army Melia Primary Care Grettell Ransdell: Alma Friendly Other Clinician: Referring Mercedes Valeriano: Alma Friendly Treating Dorin Stooksbury/Extender: Tito Dine in Treatment: 9 Encounter Discharge Information Items Post Procedure Vitals Discharge Condition: Stable Temperature (F): 97.9 Ambulatory Status: Wheelchair Pulse (bpm): 86 Discharge Destination: Home Respiratory Rate (breaths/min): 16 Transportation: Private  Auto Blood Pressure (mmHg): 146/62 Accompanied By: spouse Schedule Follow-up Appointment: Yes Clinical Summary of Care: Electronic Signature(s) Signed: 08/21/2019 11:18:27 AM By: Army Melia Entered By: Army Melia on 08/21/2019 10:24:48 [name], [name] -------------------------------------------------------------------------------- Lower Extremity Assessment Details Patient Name: [name] Date of Service: 08/21/2019 9:15 AM Medical Record Number: [id number] Patient Account Number: [id number] Date of Birth/Sex: [birth year] (49 y.o. F) Treating RN: Army Melia Primary Care Saidee Geremia: Alma Friendly Other Clinician: Referring Jeniya Flannigan: Alma Friendly Treating Kenidi Elenbaas/Extender: Ricard Dillon Weeks in Treatment: 9 Electronic Signature(s) Signed: 08/21/2019 11:18:27 AM By: Army Melia Entered By: Army Melia on 08/21/2019 09:30:14 [name], [name] -------------------------------------------------------------------------------- Multi Wound Chart Details Patient Name: [name] Date of Service: 08/21/2019 9:15 AM Medical Record Number: [id number] Patient Account Number: [id number] Date of Birth/Sex: Sep 13, 1970 (49 y.o. F) Treating RN: Army Melia Primary Care Mirriam Vadala: Alma Friendly Other Clinician: Referring Evelyne Makepeace: Alma Friendly Treating Reka Wist/Extender: Tito Dine in Treatment: 9 Vital Signs Height(in): 64 Pulse(bpm): 69 Weight(lbs): 286 Blood Pressure(mmHg): 148/62 Body Mass Index(BMI): 49 Temperature(F): 97.9 Respiratory Rate 16 (breaths/min): Photos: [N/A:N/A] Wound Location: Right Hand - 3rd Digit N/A N/A Wounding Event: Trauma N/A N/A Primary Etiology: Trauma, Other N/A N/A Comorbid History: Asthma, Chronic Obstructive N/A N/A Pulmonary Disease (COPD), Sleep Apnea, Arrhythmia, Congestive Heart Failure, Coronary Artery Disease, Hypertension, Type II Diabetes Date Acquired: 05/20/2019 N/A N/A Weeks of  Treatment: 9 N/A N/A Wound Status: Open N/A N/A Measurements L x W x D 0.3x0.3x0.2 N/A N/A (cm) Area (cm) : 0.071 N/A N/A Volume (cm) : 0.014 N/A N/A % Reduction in Area: 87.40% N/A N/A % Reduction in Volume: 87.60% N/A N/A Classification: Full Thickness Without N/A N/A Exposed Support Structures Exudate Amount: Medium N/A N/A Exudate Type: Sanguinous N/A N/A Exudate Color: red N/A N/A Wound Margin: Thickened N/A N/A Granulation Amount: Small (1-33%) N/A N/A Granulation Quality: Pink N/A N/A Necrotic Amount: Large (67-100%) N/A N/A Exposed Structures: Fat Layer (Subcutaneous N/A N/A Tissue) Exposed: Yes [name], [name] Fascia: No Tendon: No Muscle: No Joint: No  Bone: No Epithelialization: None N/A N/A Debridement: Debridement - Excisional N/A N/A Pre-procedure 09:40 N/A N/A Verification/Time Out Taken: Pain Control: Lidocaine N/A N/A Tissue Debrided: Subcutaneous, Slough N/A N/A Level: Skin/Subcutaneous Tissue N/A N/A Debridement Area (sq cm): 0.09 N/A N/A Instrument: Curette N/A N/A Specimen: Swab N/A N/A Number of Specimens 1 N/A N/A Taken: Bleeding: Minimum N/A N/A Hemostasis Achieved: Pressure N/A N/A Debridement Treatment Procedure was tolerated well N/A N/A Response: Post Debridement 0.3x0.3x0.2 N/A N/A Measurements L x W x D (cm) Post Debridement Volume: 0.014 N/A N/A (cm) Procedures Performed: Debridement N/A N/A Treatment Notes Wound #5 (Right Hand - 3rd Digit) Notes Endoform, non-adherent pad, conform - splint with 1/2 tongue blade Electronic Signature(s) Signed: 08/21/2019 5:43:36 PM By: Linton Ham MD Entered By: Linton Ham on 08/21/2019 09:57:35 [name], [name] -------------------------------------------------------------------------------- Multi-Disciplinary Care Plan Details Patient Name: [name] Date of Service: 08/21/2019 9:15 AM Medical Record Number: [id number] Patient Account Number: [id number] Date  of Birth/Sex: Jun 23, 1971 (49 y.o. F) Treating RN: Army Melia Primary Care Trajan Grove: Alma Friendly Other Clinician: Referring Margeret Stachnik: Alma Friendly Treating Forestine Macho/Extender: Tito Dine in Treatment: 9 Active Inactive Abuse / Safety / Falls / Self Care Management Nursing Diagnoses: History of Falls Goals: Patient will not develop complications from immobility Date Initiated: 06/19/2019 Target Resolution Date: 07/10/2019 Goal Status: Active Patient will remain injury free related to falls Date Initiated: 06/19/2019 Target Resolution Date: 07/10/2019 Goal Status: Active Interventions: Assess fall risk on admission and as needed Treatment Activities: Patient referred to home care : 06/19/2019 Notes: Orientation to the Wound Care Program Nursing Diagnoses: Knowledge deficit related to the wound healing center program Goals: Patient/caregiver will verbalize understanding of the [facility] Date Initiated: 06/19/2019 Target Resolution Date: 07/10/2019 Goal Status: Active Interventions: Provide education on orientation to the wound center Notes: Peripheral Neuropathy Nursing Diagnoses: Knowledge deficit related to disease process and management of peripheral neurovascular dysfunction Potential alteration in peripheral tissue perfusion (select prior to confirmation of diagnosis) [name], [name] Goals: Patient/caregiver will verbalize understanding of disease process and disease management Date Initiated: 06/19/2019 Target Resolution Date: 07/10/2019 Goal Status: Active Interventions: Assess signs and symptoms of neuropathy upon admission and as needed Treatment Activities: Patient referred for customized footwear/offloading : 06/19/2019 Notes: Wound/Skin Impairment Nursing Diagnoses: Impaired tissue integrity Goals: Patient/caregiver will verbalize understanding of skin care regimen Date Initiated: 06/19/2019 Target Resolution  Date: 07/10/2019 Goal Status: Active Ulcer/skin breakdown will have a volume reduction of 30% by week 4 Date Initiated: 06/19/2019 Target Resolution Date: 07/20/2019 Goal Status: Active Interventions: Assess patient/caregiver ability to obtain necessary supplies Assess ulceration(s) every visit Treatment Activities: Skin care regimen initiated : 06/19/2019 Topical wound management initiated : 06/19/2019 Notes: Electronic Signature(s) Signed: 08/21/2019 11:18:27 AM By: Army Melia Entered By: Army Melia on 08/21/2019 09:38:54 [name], [name] -------------------------------------------------------------------------------- Pain Assessment Details Patient Name: [name] Date of Service: 08/21/2019 9:15 AM Medical Record Number: [id number] Patient Account Number: [id number] Date of Birth/Sex: 1970/10/08 (49 y.o. F) Treating RN: Army Melia Primary Care Athalia Setterlund: Alma Friendly Other Clinician: Referring Sevannah Madia: Alma Friendly Treating Jaysean Manville/Extender: Tito Dine in Treatment: 9 Active Problems Location of Pain Severity and Description of Pain Patient Has Paino Yes Site Locations Pain Location: Pain in Ulcers Rate the pain. Current Pain Level: 9 Pain Management and Medication Current Pain Management: Electronic Signature(s) Signed: 08/21/2019 11:18:27 AM By: Army Melia Entered By: Army Melia on 08/21/2019 09:25:58 [name], [name] -------------------------------------------------------------------------------- Patient/Caregiver Education Details Patient Name: [name] Date of Service:  08/21/2019 9:15 AM Medical Record Number: [id number] Patient Account Number: [id number] Date of Birth/Gender: July 01, 1971 (49 y.o. F) Treating RN: Army Melia Primary Care Physician: Alma Friendly Other Clinician: Referring Physician: Alma Friendly Treating Physician/Extender: Tito Dine in Treatment: 9 Education  Assessment Education Provided To: Patient Education Topics Provided Wound/Skin Impairment: Handouts: Caring for Your Ulcer Methods: Demonstration, Explain/Verbal Responses: State content correctly Electronic Signature(s) Signed: 08/21/2019 11:18:27 AM By: Army Melia Entered By: Army Melia on 08/21/2019 09:45:15 [name], [name] -------------------------------------------------------------------------------- Wound Assessment Details Patient Name: [name] Date of Service: 08/21/2019 9:15 AM Medical Record Number: [id number] Patient Account Number: [id number] Date of Birth/Sex: 03-17-1971 (49 y.o. F) Treating RN: Army Melia Primary Care Tracyann Duffell: Alma Friendly Other Clinician: Referring Sevilla Murtagh: Alma Friendly Treating Danali Marinos/Extender: Tito Dine in Treatment: 9 Wound Status Wound Number: 5 Primary Trauma, Other Etiology: Wound Location: Right Hand - 3rd Digit Wound Open Wounding Event: Trauma Status: Date Acquired: 05/20/2019 Comorbid Asthma, Chronic Obstructive Pulmonary Disease Weeks Of Treatment: 9 History: (COPD), Sleep Apnea, Arrhythmia, Congestive Clustered Wound: No Heart Failure, Coronary Artery Disease, Hypertension, Type II Diabetes Photos Wound Measurements Length: (cm) 0.3 Width: (cm) 0.3 Depth: (cm) 0.2 Area: (cm) 0.071 Volume: (cm) 0.014 % Reduction in Area: 87.4% % Reduction in Volume: 87.6% Epithelialization: None Wound Description Full Thickness Without Exposed Support Classification: Structures Wound Margin: Thickened Exudate Medium Amount: Exudate Type: Sanguinous Exudate Color: red Foul Odor After Cleansing: No Slough/Fibrino Yes Wound Bed Granulation Amount: Small (1-33%) Exposed Structure Granulation Quality: Pink Fascia Exposed: No Necrotic Amount: Large (67-100%) Fat Layer (Subcutaneous Tissue) Exposed: Yes Necrotic Quality: Adherent Slough Tendon Exposed: No Muscle Exposed: No Joint  Exposed: No Bone Exposed: No Pulido, [name] Treatment Notes Wound #5 (Right Hand - 3rd Digit) Notes Endoform, non-adherent pad, conform - splint with 1/2 tongue blade Electronic Signature(s) Signed: 08/21/2019 11:18:27 AM By: Army Melia Entered By: Army Melia on 08/21/2019 09:28:57 [name], [name] -------------------------------------------------------------------------------- Vitals Details Patient Name: [name] Date of Service: 08/21/2019 9:15 AM Medical Record Number: [id number] Patient Account Number: [id number] Date of Birth/Sex: September 26, 1970 (49 y.o. F) Treating RN: Army Melia Primary Care Viera Okonski: Alma Friendly Other Clinician: Referring Maren Wiesen: Alma Friendly Treating Javarian Jakubiak/Extender: Tito Dine in Treatment: 9 Vital Signs Time Taken: 09:26 Temperature (F): 97.9 Height (in): 64 Pulse (bpm): 86 Weight (lbs): 286 Respiratory Rate (breaths/min): 16 Body Mass Index (BMI): 49.1 Blood Pressure (mmHg): 148/62 Reference Range: 80 - 120 mg / dl Electronic Signature(s) Signed: 08/21/2019 11:18:27 AM By: Army Melia Entered By: Army Melia on 08/21/2019 [id number]

## 2019-08-24 ENCOUNTER — Other Ambulatory Visit: Payer: Self-pay | Admitting: Cardiology

## 2019-08-24 DIAGNOSIS — I6521 Occlusion and stenosis of right carotid artery: Secondary | ICD-10-CM

## 2019-08-24 NOTE — Progress Notes (Signed)
Your patient 

## 2019-08-25 LAB — AEROBIC CULTURE W GRAM STAIN (SUPERFICIAL SPECIMEN): Gram Stain: NONE SEEN

## 2019-08-26 ENCOUNTER — Telehealth: Payer: Self-pay

## 2019-08-26 NOTE — Telephone Encounter (Signed)
Needs to see a kidney specialist. Options for treatment for her fluid are limited due to her worsening kidney function. Please ask her if she has one, if not, needs referral to [facility].  Thanks MJP

## 2019-08-26 NOTE — Telephone Encounter (Signed)
Called pt no answer, left a vm to return the call back

## 2019-08-26 NOTE — Telephone Encounter (Signed)
Pt called to inform us that she gain 17 lb from the last time she spoke to you and would like for you to advise her what should she do. Please advise

## 2019-08-28 ENCOUNTER — Telehealth: Payer: Self-pay

## 2019-08-28 ENCOUNTER — Other Ambulatory Visit: Payer: Self-pay

## 2019-08-28 ENCOUNTER — Encounter: Payer: BC Managed Care – PPO | Admitting: Internal Medicine

## 2019-08-28 DIAGNOSIS — L98492 Non-pressure chronic ulcer of skin of other sites with fat layer exposed: Secondary | ICD-10-CM | POA: Diagnosis not present

## 2019-08-28 DIAGNOSIS — Z880 Allergy status to penicillin: Secondary | ICD-10-CM | POA: Diagnosis not present

## 2019-08-28 DIAGNOSIS — S61202A Unspecified open wound of right middle finger without damage to nail, initial encounter: Secondary | ICD-10-CM | POA: Diagnosis not present

## 2019-08-28 DIAGNOSIS — B9561 Methicillin susceptible Staphylococcus aureus infection as the cause of diseases classified elsewhere: Secondary | ICD-10-CM | POA: Diagnosis not present

## 2019-08-28 DIAGNOSIS — E11622 Type 2 diabetes mellitus with other skin ulcer: Secondary | ICD-10-CM | POA: Diagnosis not present

## 2019-08-28 NOTE — Progress Notes (Signed)
[name], [name] Visit Report for 08/28/2019 Arrival Information Details Patient Name: [name], [name] Date of Service: 08/28/2019 9:15 AM Medical Record Number: [id number] Patient Account Number: [id number] Date of Birth/Sex: 06-04-1971 (49 y.o. F) Treating RN: Army Melia Primary Care Pattijo Juste: Alma Friendly Other Clinician: Referring Hoyle Barkdull: Alma Friendly Treating Leronda Lewers/Extender: Tito Dine in Treatment: 10 Visit Information History Since Last Visit Added or deleted any medications: No Patient Arrived: Wheel Chair Any new allergies or adverse reactions: No Arrival Time: 09:24 Had a fall or experienced change in No Accompanied By: husband activities of daily living that may affect Transfer Assistance: None risk of falls: Patient Identification Verified: Yes Signs or symptoms of abuse/neglect since last visito No Patient Has Alerts: Yes Hospitalized since last visit: No Patient Alerts: ABI L/R 1.02 TBI L/R 0.6 Has Dressing in Place as Prescribed: Yes Pain Present Now: Yes Electronic Signature(s) Signed: 08/28/2019 10:40:36 AM By: Army Melia Entered By: Army Melia on 08/28/2019 09:25:15 [name], [name] -------------------------------------------------------------------------------- Encounter Discharge Information Details Patient Name: [name] Date of Service: 08/28/2019 9:15 AM Medical Record Number: [id number] Patient Account Number: [id number] Date of Birth/Sex: 1971-05-12 (49 y.o. F) Treating RN: Cornell Barman Primary Care Jernard Reiber: Alma Friendly Other Clinician: Referring Anjelique Makar: Alma Friendly Treating Damire Remedios/Extender: Tito Dine in Treatment: 10 Encounter Discharge Information Items Post Procedure Vitals Discharge Condition: Stable Temperature (F): 97.8 Ambulatory Status: Wheelchair Pulse (bpm): 85 Discharge Destination: Home Respiratory Rate (breaths/min): 16 Transportation: Private  Auto Blood Pressure (mmHg): 129/78 Accompanied By: self Schedule Follow-up Appointment: Yes Clinical Summary of Care: Electronic Signature(s) Signed: 08/28/2019 5:27:48 PM By: Gretta Cool, BSN, RN, CWS, Kim RN, BSN Entered By: Gretta Cool, BSN, RN, CWS, Kim on 08/28/2019 09:52:34 [name] -------------------------------------------------------------------------------- Lower Extremity Assessment Details Patient Name: [name] Date of Service: 08/28/2019 9:15 AM Medical Record Number: [id number] Patient Account Number: [id number] Date of Birth/Sex: [birth year] (49 y.o. F) Treating RN: Army Melia Primary Care Velvia Mehrer: Alma Friendly Other Clinician: Referring Gwin Eagon: Alma Friendly Treating Marks Scalera/Extender: Ricard Dillon Weeks in Treatment: 10 Electronic Signature(s) Signed: 08/28/2019 10:40:36 AM By: Army Melia Entered By: Army Melia on 08/28/2019 09:28:42 [name], [name] -------------------------------------------------------------------------------- Multi Wound Chart Details Patient Name: [name] Date of Service: 08/28/2019 9:15 AM Medical Record Number: [id number] Patient Account Number: [id number] Date of Birth/Sex: Nov 27, 1970 (49 y.o. F) Treating RN: Cornell Barman Primary Care Woodard Perrell: Alma Friendly Other Clinician: Referring Pratyush Ammon: Alma Friendly Treating Braylin Xu/Extender: Tito Dine in Treatment: 10 Vital Signs Height(in): 64 Pulse(bpm): 85 Weight(lbs): 286 Blood Pressure(mmHg): 129/78 Body Mass Index(BMI): 49 Temperature(F): 97.8 Respiratory Rate 16 (breaths/min): Photos: [N/A:N/A] Wound Location: Right Hand - 3rd Digit N/A N/A Wounding Event: Trauma N/A N/A Primary Etiology: Trauma, Other N/A N/A Comorbid History: Asthma, Chronic Obstructive N/A N/A Pulmonary Disease (COPD), Sleep Apnea, Arrhythmia, Congestive Heart Failure, Coronary Artery Disease, Hypertension, Type II Diabetes Date Acquired:  05/20/2019 N/A N/A Weeks of Treatment: 10 N/A N/A Wound Status: Open N/A N/A Measurements L x W x D 0.1x0.1x0.1 N/A N/A (cm) Area (cm) : 0.008 N/A N/A Volume (cm) : 0.001 N/A N/A % Reduction in Area: 98.60% N/A N/A % Reduction in Volume: 99.10% N/A N/A Classification: Full Thickness Without N/A N/A Exposed Support Structures Exudate Amount: Medium N/A N/A Exudate Type: Sanguinous N/A N/A Exudate Color: red N/A N/A Wound Margin: Thickened N/A N/A Granulation Amount: Small (1-33%) N/A N/A Granulation Quality: Pink N/A N/A Necrotic Amount: Large (67-100%) N/A N/A Exposed Structures: Fat Layer (Subcutaneous N/A N/A Tissue) Exposed: Yes [name], [name]  Fascia: No Tendon: No Muscle: No Joint: No Bone: No Epithelialization: None N/A N/A Debridement: Debridement - Excisional N/A N/A Pre-procedure 09:47 N/A N/A Verification/Time Out Taken: Pain Control: Lidocaine N/A N/A Tissue Debrided: Subcutaneous, Slough N/A N/A Level: Skin/Subcutaneous Tissue N/A N/A Debridement Area (sq cm): 0.12 N/A N/A Instrument: Curette N/A N/A Bleeding: Minimum N/A N/A Hemostasis Achieved: Pressure N/A N/A Debridement Treatment Procedure was tolerated well N/A N/A Response: Post Debridement 0.4x0.3x0.4 N/A N/A Measurements L x W x D (cm) Post Debridement Volume: 0.038 N/A N/A (cm) Procedures Performed: Debridement N/A N/A Treatment Notes Wound #5 (Right Hand - 3rd Digit) Notes Endoform, non-adherent pad, conform - splint with 1/2 tongue blade Electronic Signature(s) Signed: 08/28/2019 4:30:10 PM By: Linton Ham MD Entered By: Linton Ham on 08/28/2019 10:00:07 [name] -------------------------------------------------------------------------------- Multi-Disciplinary Care Plan Details Patient Name: [name] Date of Service: 08/28/2019 9:15 AM Medical Record Number: [id number] Patient Account Number: [id number] Date of Birth/Sex: 01-23-1971 (49 y.o.  F) Treating RN: Cornell Barman Primary Care Gina Leblond: Alma Friendly Other Clinician: Referring Adithya Difrancesco: Alma Friendly Treating Jerek Meulemans/Extender: Tito Dine in Treatment: 10 Active Inactive Abuse / Safety / Falls / Self Care Management Nursing Diagnoses: History of Falls Goals: Patient will not develop complications from immobility Date Initiated: 06/19/2019 Target Resolution Date: 07/10/2019 Goal Status: Active Patient will remain injury free related to falls Date Initiated: 06/19/2019 Target Resolution Date: 07/10/2019 Goal Status: Active Interventions: Assess fall risk on admission and as needed Treatment Activities: Patient referred to home care : 06/19/2019 Notes: Orientation to the Wound Care Program Nursing Diagnoses: Knowledge deficit related to the wound healing center program Goals: Patient/caregiver will verbalize understanding of the [facility] Date Initiated: 06/19/2019 Target Resolution Date: 07/10/2019 Goal Status: Active Interventions: Provide education on orientation to the wound center Notes: Peripheral Neuropathy Nursing Diagnoses: Knowledge deficit related to disease process and management of peripheral neurovascular dysfunction Potential alteration in peripheral tissue perfusion (select prior to confirmation of diagnosis) [name], [name] Goals: Patient/caregiver will verbalize understanding of disease process and disease management Date Initiated: 06/19/2019 Target Resolution Date: 07/10/2019 Goal Status: Active Interventions: Assess signs and symptoms of neuropathy upon admission and as needed Treatment Activities: Patient referred for customized footwear/offloading : 06/19/2019 Notes: Wound/Skin Impairment Nursing Diagnoses: Impaired tissue integrity Goals: Patient/caregiver will verbalize understanding of skin care regimen Date Initiated: 06/19/2019 Target Resolution Date: 07/10/2019 Goal Status:  Active Ulcer/skin breakdown will have a volume reduction of 30% by week 4 Date Initiated: 06/19/2019 Target Resolution Date: 07/20/2019 Goal Status: Active Interventions: Assess patient/caregiver ability to obtain necessary supplies Assess ulceration(s) every visit Treatment Activities: Skin care regimen initiated : 06/19/2019 Topical wound management initiated : 06/19/2019 Notes: Electronic Signature(s) Signed: 08/28/2019 5:27:48 PM By: Gretta Cool, BSN, RN, CWS, Kim RN, BSN Entered By: Gretta Cool, BSN, RN, CWS, Kim on 08/28/2019 09:41:42 [name], [name] -------------------------------------------------------------------------------- Pain Assessment Details Patient Name: [name] Date of Service: 08/28/2019 9:15 AM Medical Record Number: [id number] Patient Account Number: [id number] Date of Birth/Sex: 04/01/1971 (49 y.o. F) Treating RN: Army Melia Primary Care Amalie Koran: Alma Friendly Other Clinician: Referring Lieutenant Abarca: Alma Friendly Treating Coti Burd/Extender: Tito Dine in Treatment: 10 Active Problems Location of Pain Severity and Description of Pain Patient Has Paino Yes Site Locations Pain Location: Pain in Ulcers Rate the pain. Current Pain Level: 7 Pain Management and Medication Current Pain Management: Electronic Signature(s) Signed: 08/28/2019 10:40:36 AM By: Army Melia Entered By: Army Melia on 08/28/2019 09:25:22 [name], [name] -------------------------------------------------------------------------------- Patient/Caregiver Education Details Patient Name:  [name] Date of Service: 08/28/2019 9:15 AM Medical Record Number: [id number] Patient Account Number: [id number] Date of Birth/Gender: 1971-06-13 (49 y.o. F) Treating RN: Cornell Barman Primary Care Physician: Alma Friendly Other Clinician: Referring Physician: Alma Friendly Treating Physician/Extender: Tito Dine in Treatment: 10 Education  Assessment Education Provided To: Patient Education Topics Provided Wound/Skin Impairment: Handouts: Caring for Your Ulcer Methods: Demonstration, Explain/Verbal Responses: State content correctly Electronic Signature(s) Signed: 08/28/2019 5:27:48 PM By: Gretta Cool, BSN, RN, CWS, Kim RN, BSN Entered By: Gretta Cool, BSN, RN, CWS, Kim on 08/28/2019 09:51:21 [name] -------------------------------------------------------------------------------- Wound Assessment Details Patient Name: [name] Date of Service: 08/28/2019 9:15 AM Medical Record Number: [id number] Patient Account Number: [id number] Date of Birth/Sex: [birth year] (49 y.o. F) Treating RN: Army Melia Primary Care Doss Cybulski: Alma Friendly Other Clinician: Referring Harlym Gehling: Alma Friendly Treating Domnick Chervenak/Extender: Tito Dine in Treatment: 10 Wound Status Wound Number: 5 Primary Trauma, Other Etiology: Wound Location: Right Hand - 3rd Digit Wound Open Wounding Event: Trauma Status: Date Acquired: 05/20/2019 Comorbid Asthma, Chronic Obstructive Pulmonary Disease Weeks Of Treatment: 10 History: (COPD), Sleep Apnea, Arrhythmia, Congestive Clustered Wound: No Heart Failure, Coronary Artery Disease, Hypertension, Type II Diabetes Photos Wound Measurements Length: (cm) 0.1 Width: (cm) 0.1 Depth: (cm) 0.1 Area: (cm) 0.008 Volume: (cm) 0.001 % Reduction in Area: 98.6% % Reduction in Volume: 99.1% Epithelialization: None Tunneling: No Undermining: No Wound Description Full Thickness Without Exposed Support Classification: Structures Wound Margin: Thickened Exudate Medium Amount: Exudate Type: Sanguinous Exudate Color: red Foul Odor After Cleansing: No Slough/Fibrino Yes Wound Bed Granulation Amount: Small (1-33%) Exposed Structure Granulation Quality: Pink Fascia Exposed: No Necrotic Amount: Large (67-100%) Fat Layer (Subcutaneous Tissue) Exposed: Yes Necrotic Quality:  Adherent Slough Tendon Exposed: No Muscle Exposed: No Joint Exposed: No Bone Exposed: No [name], [name] Treatment Notes Wound #5 (Right Hand - 3rd Digit) Notes Endoform, non-adherent pad, conform - splint with 1/2 tongue blade Electronic Signature(s) Signed: 08/28/2019 10:40:36 AM By: Army Melia Entered By: Army Melia on 08/28/2019 09:27:18 [name], [name] -------------------------------------------------------------------------------- Vitals Details Patient Name: [name] Date of Service: 08/28/2019 9:15 AM Medical Record Number: [id number] Patient Account Number: [id number] Date of Birth/Sex: Nov 18, 1970 (49 y.o. F) Treating RN: Army Melia Primary Care Oseph Imburgia: Alma Friendly Other Clinician: Referring Bryella Diviney: Alma Friendly Treating Lyliana Dicenso/Extender: Tito Dine in Treatment: 10 Vital Signs Time Taken: 09:25 Temperature (F): 97.8 Height (in): 64 Pulse (bpm): 85 Weight (lbs): 286 Respiratory Rate (breaths/min): 16 Body Mass Index (BMI): 49.1 Blood Pressure (mmHg): 129/78 Reference Range: 80 - 120 mg / dl Electronic Signature(s) Signed: 08/28/2019 10:40:36 AM By: Army Melia Entered By: Army Melia on 08/28/2019 09:25:57

## 2019-08-28 NOTE — Progress Notes (Signed)
LONNA, MAS ([id number]) Visit Report for 08/28/2019 Debridement Details Patient Name: [name], [name] Date of Service: 08/28/2019 9:15 AM Medical Record Number: [id number] Patient Account Number: [id number] Date of Birth/Sex: [birth year] (49 y.o. F) Treating RN: Cornell Barman Primary Care Provider: Alma Friendly Other Clinician: Referring Provider: Alma Friendly Treating Provider/Extender: Tito Dine in Treatment: 10 Debridement Performed for Wound #5 Right Hand - 3rd Digit Assessment: Performed By: Physician Ricard Dillon, MD Debridement Type: Debridement Level of Consciousness (Pre- Awake and Alert procedure): Pre-procedure Verification/Time Yes - 09:47 Out Taken: Start Time: 09:47 Pain Control: Lidocaine Total Area Debrided (L x W): 0.4 (cm) x 0.3 (cm) = 0.12 (cm) Tissue and other material Viable, Non-Viable, Slough, Subcutaneous, Biofilm, Slough debrided: Level: Skin/Subcutaneous Tissue Debridement Description: Excisional Instrument: Curette Bleeding: Minimum Hemostasis Achieved: Pressure Response to Treatment: Procedure was tolerated well Level of Consciousness Awake and Alert (Post-procedure): Post Debridement Measurements of Total Wound Length: (cm) 0.4 Width: (cm) 0.3 Depth: (cm) 0.4 Volume: (cm) 0.038 Character of Wound/Ulcer Post Debridement: Stable Post Procedure Diagnosis Same as Pre-procedure Electronic Signature(s) Signed: 08/28/2019 4:30:10 PM By: Linton Ham MD Signed: 08/28/2019 5:27:48 PM By: Gretta Cool, BSN, RN, CWS, Kim RN, BSN Entered By: Linton Ham on 08/28/2019 10:01:00 [name] -------------------------------------------------------------------------------- HPI Details Patient Name: [name] Date of Service: 08/28/2019 9:15 AM Medical Record Number: [id number] Patient Account Number: [id number] Date of Birth/Sex: 1970/12/05 (49 y.o. F) Treating RN: Cornell Barman Primary Care Provider: Alma Friendly Other Clinician: Referring Provider: Alma Friendly Treating Provider/Extender: Tito Dine in Treatment: 10 History of Present Illness HPI Description: ADMISSION 06/19/2019 Patient is a 49 year old woman who is accompanied by her husband. She has severe diabetic peripheral neuropathy which is left her minimally ambulatory. She spends most of the time in a wheelchair. She also has a history of chronic lower extremity edema history of left leg DVT. She states that on 2 separate occasions roughly a month ago she fell with lacerations on the bilateral anterior tibial areas. She was seen by primary care on 06/04/2019 diagnosed with cellulitis of the left leg put on Bactrim DS. Also noted to have hand wounds with a question of referral to hand surgery. She was also sent to our clinic at the same time. The patient's husband is doing the dressings. He has been using silver alginate that was supplied by home health. He is done a really good job the area on the left leg is actually healed only a small superficial area on the right Somewhere in the in the last 2 weeks she gives a bizarre history of going to wash nail polish off the fingers of both hands. She apparently did not realize that her son had put Clorox in the vinegar jar that they used to clean the countertop. She ended up burning her hands. The patient has a wound on the PIP of the left third finger, PIP of the right second finger and the lateral part of the right third finger Past medical history includes COPD, obstructive sleep apnea on nocturnal O2, type 2 diabetes with peripheral neuropathy, retinopathy, chronic diastolic heart failure coronary artery disease, severe diabetic neuropathy type 2 diabetes apparently diagnosed at age 49 on insulin, COPD, continued tobacco abuse, left leg deep DVT, hypertension, TIAs, seizure disorders, asthma The patient is actually had arterial studies in February of this year. ABIs  bilaterally were 1.02 and TBI's bilaterally at 0.6. Waveforms were triphasic and biphasic. She was not felt to have significant arterial disease. 12/23; patient  readmitted the clinic last week. She has predominantly venous insufficiency wounds on the right medial lower leg. She also had open areas on the fingers of both hands which happened in a very bizarre way. She has almost complete healing on the right medial calf. The area on the left third finger is healed. She still has 2 open wounds on the medial aspect of the PIP of the right second finger and the dorsal aspect of the right PIP of the third finger. Of these 2 wounds the PIP of the third finger wound is painful and deeper 12/30; the areas on her right medial calf are closed. She likely has some degree of chronic venous insufficiency and lymphedema. Also very xerotic skin which I have recommended a moisturizing. I did not bring up the issue of compression stockings The areas we are still looking out are on the PIP of the right third finger and the medial part of the PIP of the right second finger. The second finger area is just about closed. The problem is over the PIP of the right third finger. This does not look infected and she is completing her antibiotics from last week. However this is a deep wound at the base of this there is either tendon or periosteum. She she has some healthy granulation but we are not seeing that close over the bottom part of the wound. We are probably going to need to immobilize the finger somewhat so that she does not continuously pull the tissue apart 07/17/2019; right medial calf remains closed and the right second finger is closed. The sole problem here is an area over the PIP of the third finger. These wounds were initially advertised as burn injuries that happened in a very bizarre way [please see my initial discussion on this]. I gave her doxycycline last week because of some erythema around the wound  however she did not tolerate this very well because of nausea. She stopped this within the last day or 2. we have been using silver collagen 1/13; the only wound we are looking at is the right third finger over the PIP. We are using endoform. X-ray ordered last time showed no underlying bone abnormalities 1/20; right third finger over the PIP. Still debris at the surface we have been using endoform. We are making some improvements in surface area and volume but still requiring debridement. JAHZELLE, KNOBLOCH ([id number]) 1/27; right third finger over the PIP. Still debris on the surface of the wound and maceration. We have been using endoform. This wound initially went to the periosteum and it certainly is off that but is certainly stalled over the last 2 or 3 weeks 2/10; right third finger over the PIP. We switch to Iodoflex last time but she says this caused extreme erythema and blistering and they changed back to endoform. This is a wound that originally went to the periosteum. This is improved since her admission but really it is stalled. She missed her appointment last week but she states she has been using endoform. She complains of increasing pain in the finger that is making it difficult for her to sleep at times 2/17; right third finger over the PIP. This is a difficult wound small punched out area. Culture I did last week showed methicillin sensitive staph aureus however she is allergic to penicillin [widespread rash as a child] she has not had cephalosporins. Multiple drug interactions with quinolones and I was concerned about trimethoprim sulfamethoxazole in somebody with ACE inhibitors and spironolactone.  I prescribed doxycycline. This was 2 days ago. She comes in today without the antibiotic saying that it caused mental status changes fatigue and diarrhea the last time she was on this. Electronic Signature(s) Signed: 08/28/2019 4:30:10 PM By: Linton Ham MD Entered By: Linton Ham  on 08/28/2019 10:02:31 [name] -------------------------------------------------------------------------------- Physical Exam Details Patient Name: [name] Date of Service: 08/28/2019 9:15 AM Medical Record Number: [id number] Patient Account Number: [id number] Date of Birth/Sex: 1971/05/29 (50 y.o. F) Treating RN: Cornell Barman Primary Care Provider: Alma Friendly Other Clinician: Referring Provider: Alma Friendly Treating Provider/Extender: Tito Dine in Treatment: 10 Notes Wound exam; dorsal PIP of the right third finger small punched out area. Still debris over the surface some not very viable looking drainage. Some surrounding erythema. There is no tenderness over the joint and I do not think any effusion. Electronic Signature(s) Signed: 08/28/2019 4:30:10 PM By: Linton Ham MD Entered By: Linton Ham on 08/28/2019 10:03:33 [name] -------------------------------------------------------------------------------- Physician Orders Details Patient Name: [name] Date of Service: 08/28/2019 9:15 AM Medical Record Number: [id number] Patient Account Number: [id number] Date of Birth/Sex: 29-Jul-1970 (50 y.o. F) Treating RN: Cornell Barman Primary Care Provider: Alma Friendly Other Clinician: Referring Provider: Alma Friendly Treating Provider/Extender: Tito Dine in Treatment: 10 Verbal / Phone Orders: No Diagnosis Coding Wound Cleansing Wound #5 Right Hand - 3rd Digit o Clean wound with Normal Saline. o May Shower, gently pat wound dry prior to applying new dressing. Anesthetic (add to Medication List) Wound #5 Right Hand - 3rd Digit o Topical Lidocaine 4% cream applied to wound bed prior to debridement (In Clinic Only). Skin Barriers/Peri-Wound Care Wound #5 Right Hand - 3rd Digit o Other: - Bacitracin Primary Wound Dressing Wound #5 Right Hand - 3rd Digit o Other: - Endoform  moistened with saline Secondary Dressing Wound #5 Right Hand - 3rd Digit o Non-adherent pad - Splint secured with Conform and tape Dressing Change Frequency Wound #5 Right Hand - 3rd Digit o Change Dressing Monday, Wednesday, Friday Follow-up Appointments Wound #5 Right Hand - 3rd Digit o Return Appointment in 1 week. Additional Orders / Instructions Wound #5 Right Hand - 3rd Digit o Other: - Keep finger as straight as possible. Home Health Wound #5 Right Hand - 3rd Digit o Continue Home Health Visits - [facility] Nurse may visit PRN to address patientos wound care needs. o FACE TO FACE ENCOUNTER: MEDICARE and MEDICAID PATIENTS: I certify that this patient is under my care and that I had a face-to-face encounter that meets the physician face-to-face encounter requirements with this patient on this date. The encounter with the patient was in whole or in part for the following MEDICAL CONDITION: (primary reason for [place]) MEDICAL NECESSITY: I certify, that based on my findings, REEM, ROOPNARINE ([id number]) NURSING services are a medically necessary home health service. HOME BOUND STATUS: I certify that my clinical findings support that this patient is homebound (i.e., Due to illness or injury, pt requires aid of supportive devices such as crutches, cane, wheelchairs, walkers, the use of special transportation or the assistance of another person to leave their place of residence. There is a normal inability to leave the home and doing so requires considerable and taxing effort. Other absences are for medical reasons / religious services and are infrequent or of short duration when for other reasons). o If current dressing causes regression in wound condition, may D/C ordered dressing product/s and apply Normal Saline Moist  Dressing daily until next [place] / Other MD appointment. [place] of regression in wound  condition at [phone]. o Please direct any NON-WOUND related issues/requests for orders to patient's Primary Care Physician Medications-please add to medication list. Wound #5 Right Hand - 3rd Digit o P.O. Antibiotics - Doxycycline BID for 10 days Electronic Signature(s) Signed: 08/28/2019 4:30:10 PM By: Linton Ham MD Signed: 08/28/2019 5:27:48 PM By: Gretta Cool, BSN, RN, CWS, Kim RN, BSN Entered By: Gretta Cool, BSN, RN, CWS, Kim on 08/28/2019 09:50:43 ALIDA, BLECHA ([id number]) -------------------------------------------------------------------------------- Problem List Details Patient Name: [name] Date of Service: 08/28/2019 9:15 AM Medical Record Number: [id number] Patient Account Number: [id number] Date of Birth/Sex: Nov 08, 1970 (49 y.o. F) Treating RN: Cornell Barman Primary Care Provider: Alma Friendly Other Clinician: Referring Provider: Alma Friendly Treating Provider/Extender: Tito Dine in Treatment: 10 Active Problems ICD-10 Evaluated Encounter Code Description Active Date Today Diagnosis S61.401D Unspecified open wound of right hand, subsequent encounter 06/19/2019 No Yes E11.622 Type 2 diabetes mellitus with other skin ulcer 06/19/2019 No Yes Inactive Problems ICD-10 Code Description Active Date Inactive Date L97.211 Non-pressure chronic ulcer of right calf limited to breakdown of skin 06/19/2019 06/19/2019 L97.821 Non-pressure chronic ulcer of other part of left lower leg limited to 06/19/2019 06/19/2019 breakdown of skin I89.0 Lymphedema, not elsewhere classified 06/19/2019 06/19/2019 S61.402D Unspecified open wound of left hand, subsequent encounter 06/19/2019 06/19/2019 Resolved Problems Electronic Signature(s) Signed: 08/28/2019 4:30:10 PM By: Linton Ham MD Entered By: Linton Ham on 08/28/2019 09:59:57 Chrismer, [name] ([id number]) -------------------------------------------------------------------------------- Progress Note Details Patient  Name: [name] Date of Service: 08/28/2019 9:15 AM Medical Record Number: [id number] Patient Account Number: [id number] Date of Birth/Sex: 02-22-1971 (49 y.o. F) Treating RN: Cornell Barman Primary Care Provider: Alma Friendly Other Clinician: Referring Provider: Alma Friendly Treating Provider/Extender: Tito Dine in Treatment: 10 Subjective History of Present Illness (HPI) ADMISSION 06/19/2019 Patient is a 49 year old woman who is accompanied by her husband. She has severe diabetic peripheral neuropathy which is left her minimally ambulatory. She spends most of the time in a wheelchair. She also has a history of chronic lower extremity edema history of left leg DVT. She states that on 2 separate occasions roughly a month ago she fell with lacerations on the bilateral anterior tibial areas. She was seen by primary care on 06/04/2019 diagnosed with cellulitis of the left leg put on Bactrim DS. Also noted to have hand wounds with a question of referral to hand surgery. She was also sent to our clinic at the same time. The patient's husband is doing the dressings. He has been using silver alginate that was supplied by home health. He is done a really good job the area on the left leg is actually healed only a small superficial area on the right Somewhere in the in the last 2 weeks she gives a bizarre history of going to wash nail polish off the fingers of both hands. She apparently did not realize that her son had put Clorox in the vinegar jar that they used to clean the countertop. She ended up burning her hands. The patient has a wound on the PIP of the left third finger, PIP of the right second finger and the lateral part of the right third finger Past medical history includes COPD, obstructive sleep apnea on nocturnal O2, type 2 diabetes with peripheral neuropathy, retinopathy, chronic diastolic heart failure coronary artery disease, severe diabetic neuropathy type 2  diabetes apparently diagnosed at age 21 on insulin, COPD,  continued tobacco abuse, left leg deep DVT, hypertension, TIAs, seizure disorders, asthma The patient is actually had arterial studies in February of this year. ABIs bilaterally were 1.02 and TBI's bilaterally at 0.6. Waveforms were triphasic and biphasic. She was not felt to have significant arterial disease. 12/23; patient readmitted the clinic last week. She has predominantly venous insufficiency wounds on the right medial lower leg. She also had open areas on the fingers of both hands which happened in a very bizarre way. She has almost complete healing on the right medial calf. The area on the left third finger is healed. She still has 2 open wounds on the medial aspect of the PIP of the right second finger and the dorsal aspect of the right PIP of the third finger. Of these 2 wounds the PIP of the third finger wound is painful and deeper 12/30; the areas on her right medial calf are closed. She likely has some degree of chronic venous insufficiency and lymphedema. Also very xerotic skin which I have recommended a moisturizing. I did not bring up the issue of compression stockings The areas we are still looking out are on the PIP of the right third finger and the medial part of the PIP of the right second finger. The second finger area is just about closed. The problem is over the PIP of the right third finger. This does not look infected and she is completing her antibiotics from last week. However this is a deep wound at the base of this there is either tendon or periosteum. She she has some healthy granulation but we are not seeing that close over the bottom part of the wound. We are probably going to need to immobilize the finger somewhat so that she does not continuously pull the tissue apart 07/17/2019; right medial calf remains closed and the right second finger is closed. The sole problem here is an area over the PIP of the  third finger. These wounds were initially advertised as burn injuries that happened in a very bizarre way [please see my initial discussion on this]. I gave her doxycycline last week because of some erythema around the wound however she did not tolerate this very well because of nausea. She stopped this within the last day or 2. we have been using silver collagen 1/13; the only wound we are looking at is the right third finger over the PIP. We are using endoform. X-ray ordered last time showed no underlying bone abnormalities 1/20; right third finger over the PIP. Still debris at the surface we have been using endoform. We are making some Welke, Jullisa ([id number]) improvements in surface area and volume but still requiring debridement. 1/27; right third finger over the PIP. Still debris on the surface of the wound and maceration. We have been using endoform. This wound initially went to the periosteum and it certainly is off that but is certainly stalled over the last 2 or 3 weeks 2/10; right third finger over the PIP. We switch to Iodoflex last time but she says this caused extreme erythema and blistering and they changed back to endoform. This is a wound that originally went to the periosteum. This is improved since her admission but really it is stalled. She missed her appointment last week but she states she has been using endoform. She complains of increasing pain in the finger that is making it difficult for her to sleep at times 2/17; right third finger over the PIP. This is a difficult wound  small punched out area. Culture I did last week showed methicillin sensitive staph aureus however she is allergic to penicillin [widespread rash as a child] she has not had cephalosporins. Multiple drug interactions with quinolones and I was concerned about trimethoprim sulfamethoxazole in somebody with ACE inhibitors and spironolactone. I prescribed doxycycline. This was 2 days ago. She comes in today  without the antibiotic saying that it caused mental status changes fatigue and diarrhea the last time she was on this. Objective Constitutional Vitals Time Taken: 9:25 AM, Height: 64 in, Weight: 286 lbs, BMI: 49.1, Temperature: 97.8 F, Pulse: 85 bpm, Respiratory Rate: 16 breaths/min, Blood Pressure: 129/78 mmHg. Integumentary (Hair, Skin) Wound #5 status is Open. Original cause of wound was Trauma. The wound is located on the Right Hand - 3rd Digit. The wound measures 0.1cm length x 0.1cm width x 0.1cm depth; 0.008cm^2 area and 0.001cm^3 volume. There is Fat Layer (Subcutaneous Tissue) Exposed exposed. There is no tunneling or undermining noted. There is a medium amount of sanguinous drainage noted. The wound margin is thickened. There is small (1-33%) pink granulation within the wound bed. There is a large (67-100%) amount of necrotic tissue within the wound bed including Adherent Slough. Assessment Active Problems ICD-10 Unspecified open wound of right hand, subsequent encounter Type 2 diabetes mellitus with other skin ulcer Procedures Wound #5 Pre-procedure diagnosis of Wound #5 is a Trauma, Other located on the Right Hand - 3rd Digit . There was a Excisional Skin/Subcutaneous Tissue Debridement with a total area of 0.12 sq cm performed by Ricard Dillon, MD. With the following instrument(s): Curette to remove Viable and Non-Viable tissue/material. Material removed includes Subcutaneous Tissue, Slough, and Biofilm after achieving pain control using Lidocaine. A time out was conducted at 09:47, prior to the start Enon, Zoiey ([id number]) of the procedure. A Minimum amount of bleeding was controlled with Pressure. The procedure was tolerated well. Post Debridement Measurements: 0.4cm length x 0.3cm width x 0.4cm depth; 0.038cm^3 volume. Character of Wound/Ulcer Post Debridement is stable. Post procedure Diagnosis Wound #5: Same as Pre-Procedure Plan Wound Cleansing: Wound #5  Right Hand - 3rd Digit: Clean wound with Normal Saline. May Shower, gently pat wound dry prior to applying new dressing. Anesthetic (add to Medication List): Wound #5 Right Hand - 3rd Digit: Topical Lidocaine 4% cream applied to wound bed prior to debridement (In Clinic Only). Skin Barriers/Peri-Wound Care: Wound #5 Right Hand - 3rd Digit: Other: - Bacitracin Primary Wound Dressing: Wound #5 Right Hand - 3rd Digit: Other: - Endoform moistened with saline Secondary Dressing: Wound #5 Right Hand - 3rd Digit: Non-adherent pad - Splint secured with Conform and tape Dressing Change Frequency: Wound #5 Right Hand - 3rd Digit: Change Dressing Monday, Wednesday, Friday Follow-up Appointments: Wound #5 Right Hand - 3rd Digit: Return Appointment in 1 week. Additional Orders / Instructions: Wound #5 Right Hand - 3rd Digit: Other: - Keep finger as straight as possible. Home Health: Wound #5 Right Hand - 3rd Digit: [place] Nurse may visit PRN to address patient s wound care needs. FACE TO FACE ENCOUNTER: MEDICARE and MEDICAID PATIENTS: I certify that this patient is under my care and that I had a face-to-face encounter that meets the physician face-to-face encounter requirements with this patient on this date. The encounter with the patient was in whole or in part for the following MEDICAL CONDITION: (primary reason for [place]) MEDICAL NECESSITY: I certify, that based on my findings, NURSING services are  a medically necessary home health service. HOME BOUND STATUS: I certify that my clinical findings support that this patient is homebound (i.e., Due to illness or injury, pt requires aid of supportive devices such as crutches, cane, wheelchairs, walkers, the use of special transportation or the assistance of another person to leave their place of residence. There is a normal inability to leave the home and doing so requires considerable  and taxing effort. Other absences are for medical reasons / religious services and are infrequent or of short duration when for other reasons). If current dressing causes regression in wound condition, may D/C ordered dressing product/s and apply Normal Saline Moist Dressing daily until next [place] / Other MD appointment. [place] of regression in wound condition at [phone]. Please direct any NON-WOUND related issues/requests for orders to patient's Primary Care Physician Medications-please add to medication list.: Wound #5 Right Hand - 3rd Digit: P.O. Antibiotics - Doxycycline BID for 10 days Hoeger, Suprena ([id number]) 1. I am going to use bacitracin to moisten the endoform change daily 2. I talked to her about the options the doxycycline by starting with the fact that does not cause mental status changes however could cause diarrhea nausea pill esophagitis etc. The option would be clindamycin [risk of pseudomembranous colitis] and/or an expensive medication like linezolid. They elected to try the doxycycline. The option would be clindamycin unless somebody can tell me that she has had a cephalosporin before. Electronic Signature(s) Signed: 08/28/2019 4:30:10 PM By: Linton Ham MD Entered By: Linton Ham on 08/28/2019 10:04:51 [place], [name] ([id number]) -------------------------------------------------------------------------------- SuperBill Details Patient Name: [name] Date of Service: 08/28/2019 Medical Record Number: [id number] Patient Account Number: [id number] Date of Birth/Sex: Aug 25, 1970 (49 y.o. F) Treating RN: Cornell Barman Primary Care Provider: Alma Friendly Other Clinician: Referring Provider: Alma Friendly Treating Provider/Extender: Tito Dine in Treatment: 10 Diagnosis Coding ICD-10 Codes Code Description S61.401D Unspecified open wound of right hand, subsequent encounter E11.622 Type 2 diabetes  mellitus with other skin ulcer Facility Procedures CPT4 Code: [id number] Description: [id number] - DEB SUBQ TISSUE 20 SQ CM/< ICD-10 Diagnosis Description S61.401D Unspecified open wound of right hand, subsequent encou E11.622 Type 2 diabetes mellitus with other skin ulcer Modifier: nter Quantity: 1 Physician Procedures CPT4 Code: [id number] Description: [id number] - WC PHYS SUBQ TISS 20 SQ CM ICD-10 Diagnosis Description S61.401D Unspecified open wound of right hand, subsequent encou E11.622 Type 2 diabetes mellitus with other skin ulcer Modifier: nter Quantity: 1 Electronic Signature(s) Signed: 08/28/2019 4:30:10 PM By: Linton Ham MD Entered By: Linton Ham on 08/28/2019 10:05:08

## 2019-08-28 NOTE — Telephone Encounter (Signed)
Spoke with patient regarding referral to [facility].

## 2019-08-30 DIAGNOSIS — E113513 Type 2 diabetes mellitus with proliferative diabetic retinopathy with macular edema, bilateral: Secondary | ICD-10-CM | POA: Diagnosis not present

## 2019-08-30 DIAGNOSIS — H2513 Age-related nuclear cataract, bilateral: Secondary | ICD-10-CM | POA: Diagnosis not present

## 2019-08-30 DIAGNOSIS — N950 Postmenopausal bleeding: Secondary | ICD-10-CM | POA: Insufficient documentation

## 2019-08-30 HISTORY — DX: Postmenopausal bleeding: N95.0

## 2019-08-30 LAB — HM DIABETES EYE EXAM

## 2019-08-30 NOTE — Assessment & Plan Note (Signed)
Contacted D.r Loanne Drilling.. he will contact pt to make recommedations for her recent high CBGs.

## 2019-08-30 NOTE — Assessment & Plan Note (Signed)
Can use atarax as needed for anxiety.

## 2019-08-30 NOTE — Progress Notes (Signed)
[name], [name] ([id number]) Visit Report for 08/07/2019 Debridement Details Patient Name: [name], [name] Date of Service: 08/07/2019 9:15 AM Medical Record Number: [id number] Patient Account Number: [id number] Date of Birth/Sex: Aug 07, 1970 (49 y.o. F) Treating RN: Cornell Barman Primary Care Provider: Alma Friendly Other Clinician: Referring Provider: Alma Friendly Treating Provider/Extender: Tito Dine in Treatment: 7 Debridement Performed for Wound #5 Right Hand - 3rd Digit Assessment: Performed By: Physician Ricard Dillon, MD Debridement Type: Debridement Level of Consciousness (Pre- Awake and Alert procedure): Pre-procedure Verification/Time Yes - 09:32 Out Taken: Start Time: 09:32 Pain Control: Lidocaine Total Area Debrided (L x W): 0.3 (cm) x 0.4 (cm) = 0.12 (cm) Tissue and other material Viable, Non-Viable, Slough, Subcutaneous, Biofilm, Slough debrided: Level: Skin/Subcutaneous Tissue Debridement Description: Excisional Instrument: Curette Bleeding: Minimum Hemostasis Achieved: Pressure End Time: 09:35 Response to Treatment: Procedure was tolerated well Level of Consciousness Awake and Alert (Post-procedure): Post Debridement Measurements of Total Wound Length: (cm) 0.3 Width: (cm) 0.4 Depth: (cm) 0.3 Volume: (cm) 0.028 Character of Wound/Ulcer Post Debridement: Stable Post Procedure Diagnosis Same as Pre-procedure Electronic Signature(s) Signed: 08/07/2019 4:18:13 PM By: Linton Ham MD Signed: 08/30/2019 11:50:22 AM By: Gretta Cool, BSN, RN, CWS, Kim RN, BSN Entered By: Linton Ham on 08/07/2019 09:42:09 [name], [name] ([id number]) -------------------------------------------------------------------------------- HPI Details Patient Name: [name] Date of Service: 08/07/2019 9:15 AM Medical Record Number: [id number] Patient Account Number: [id number] Date of Birth/Sex: 08-Aug-1970 (49 y.o. F) Treating RN: Cornell Barman Primary Care  Provider: Alma Friendly Other Clinician: Referring Provider: Alma Friendly Treating Provider/Extender: Tito Dine in Treatment: 7 History of Present Illness HPI Description: ADMISSION 06/19/2019 Patient is a 49 year old woman who is accompanied by her husband. She has severe diabetic peripheral neuropathy which is left her minimally ambulatory. She spends most of the time in a wheelchair. She also has a history of chronic lower extremity edema history of left leg DVT. She states that on 2 separate occasions roughly a month ago she fell with lacerations on the bilateral anterior tibial areas. She was seen by primary care on 06/04/2019 diagnosed with cellulitis of the left leg put on Bactrim DS. Also noted to have hand wounds with a question of referral to hand surgery. She was also sent to our clinic at the same time. The patient's husband is doing the dressings. He has been using silver alginate that was supplied by home health. He is done a really good job the area on the left leg is actually healed only a small superficial area on the right Somewhere in the in the last 2 weeks she gives a bizarre history of going to wash nail polish off the fingers of both hands. She apparently did not realize that her son had put Clorox in the vinegar jar that they used to clean the countertop. She ended up burning her hands. The patient has a wound on the PIP of the left third finger, PIP of the right second finger and the lateral part of the right third finger Past medical history includes COPD, obstructive sleep apnea on nocturnal O2, type 2 diabetes with peripheral neuropathy, retinopathy, chronic diastolic heart failure coronary artery disease, severe diabetic neuropathy type 2 diabetes apparently diagnosed at age 71 on insulin, COPD, continued tobacco abuse, left leg deep DVT, hypertension, TIAs, seizure disorders, asthma The patient is actually had arterial studies in February of  this year. ABIs bilaterally were 1.02 and TBI's bilaterally at 0.6. Waveforms were triphasic and biphasic. She was not felt to have significant arterial  disease. 12/23; patient readmitted the clinic last week. She has predominantly venous insufficiency wounds on the right medial lower leg. She also had open areas on the fingers of both hands which happened in a very bizarre way. She has almost complete healing on the right medial calf. The area on the left third finger is healed. She still has 2 open wounds on the medial aspect of the PIP of the right second finger and the dorsal aspect of the right PIP of the third finger. Of these 2 wounds the PIP of the third finger wound is painful and deeper 12/30; the areas on her right medial calf are closed. She likely has some degree of chronic venous insufficiency and lymphedema. Also very xerotic skin which I have recommended a moisturizing. I did not bring up the issue of compression stockings The areas we are still looking out are on the PIP of the right third finger and the medial part of the PIP of the right second finger. The second finger area is just about closed. The problem is over the PIP of the right third finger. This does not look infected and she is completing her antibiotics from last week. However this is a deep wound at the base of this there is either tendon or periosteum. She she has some healthy granulation but we are not seeing that close over the bottom part of the wound. We are probably going to need to immobilize the finger somewhat so that she does not continuously pull the tissue apart 07/17/2019; right medial calf remains closed and the right second finger is closed. The sole problem here is an area over the PIP of the third finger. These wounds were initially advertised as burn injuries that happened in a very bizarre way [please see my initial discussion on this]. I gave her doxycycline last week because of some erythema around  the wound however she did not tolerate this very well because of nausea. She stopped this within the last day or 2. we have been using silver collagen 1/13; the only wound we are looking at is the right third finger over the PIP. We are using endoform. X-ray ordered last time showed no underlying bone abnormalities 1/20; right third finger over the PIP. Still debris at the surface we have been using endoform. We are making some improvements in surface area and volume but still requiring debridement. [name], [name] 1/27; right third finger over the PIP. Still debris on the surface of the wound and maceration. We have been using endoform. This wound initially went to the periosteum and it certainly is off that but is certainly stalled over the last 2 or 3 weeks Electronic Signature(s) Signed: 08/07/2019 4:18:13 PM By: Linton Ham MD Entered By: Linton Ham on 08/07/2019 09:43:44 [name], [name] -------------------------------------------------------------------------------- Physical Exam Details Patient Name: [name] Date of Service: 08/07/2019 9:15 AM Medical Record Number: [id number] Patient Account Number: [id number] Date of Birth/Sex: Jan 26, 1971 (49 y.o. F) Treating RN: Cornell Barman Primary Care Provider: Alma Friendly Other Clinician: Referring Provider: Alma Friendly Treating Provider/Extender: Tito Dine in Treatment: 7 Constitutional Sitting or standing Blood Pressure is within target range for patient.. Pulse regular and within target range for patient.Marland Kitchen Respirations regular, non-labored and within target range.. Temperature is normal and within the target range for the patient.Marland Kitchen appears in no distress. Notes Wound exam; wound over the dorsal PIP of the right third finger. Once again debridement required with a curette removing adherent subcutaneous debris. I  am able to get to a healthy granulated surface. This is certainly a lot  better than the periosteum that we had when she first came here however it is not changed much in the last several weeks. There is no evidence of surrounding infection. Electronic Signature(s) Signed: 08/07/2019 4:18:13 PM By: Linton Ham MD Entered By: Linton Ham on 08/07/2019 09:45:44 [name], [name] -------------------------------------------------------------------------------- Physician Orders Details Patient Name: [name] Date of Service: 08/07/2019 9:15 AM Medical Record Number: [id number] Patient Account Number: [id number] Date of Birth/Sex: October 31, 1970 (49 y.o. F) Treating RN: Cornell Barman Primary Care Provider: Alma Friendly Other Clinician: Referring Provider: Alma Friendly Treating Provider/Extender: Tito Dine in Treatment: 7 Verbal / Phone Orders: No Diagnosis Coding Wound Cleansing Wound #5 Right Hand - 3rd Digit o Clean wound with Normal Saline. o May Shower, gently pat wound dry prior to applying new dressing. Anesthetic (add to Medication List) Wound #5 Right Hand - 3rd Digit o Topical Lidocaine 4% cream applied to wound bed prior to debridement (In Clinic Only). Primary Wound Dressing Wound #5 Right Hand - 3rd Digit o Iodoflex Secondary Dressing Wound #5 Right Hand - 3rd Digit o Non-adherent pad - Splint secured with Conform and tape Dressing Change Frequency Wound #5 Right Hand - 3rd Digit o Change Dressing Monday, Wednesday, Friday Follow-up Appointments Wound #5 Right Hand - 3rd Digit o Return Appointment in 1 week. Additional Orders / Instructions Wound #5 Right Hand - 3rd Digit o Other: - Keep finger as straight as possible. Home Health Wound #5 Right Hand - 3rd Digit o Continue Home Health Visits - [place] Nurse may visit PRN to address patientos wound care needs. o FACE TO FACE ENCOUNTER: MEDICARE and MEDICAID PATIENTS: I certify that this patient is under my  care and that I had a face-to-face encounter that meets the physician face-to-face encounter requirements with this patient on this date. The encounter with the patient was in whole or in part for the following MEDICAL CONDITION: (primary reason for [place]) MEDICAL NECESSITY: I certify, that based on my findings, NURSING services are a medically necessary home health service. HOME BOUND STATUS: I certify that my clinical findings support that this patient is homebound (i.e., Due to illness or injury, pt requires aid of supportive devices such as crutches, cane, wheelchairs, walkers, the use of special transportation or the assistance of another person to leave their place of residence. There is a normal inability to leave the home [place], [facility] ([id number]) and doing so requires considerable and taxing effort. Other absences are for medical reasons / religious services and are infrequent or of short duration when for other reasons). o If current dressing causes regression in wound condition, may D/C ordered dressing product/s and apply Normal Saline Moist Dressing daily until next [place] / Other MD appointment. [place] of regression in wound condition at [phone]. o Please direct any NON-WOUND related issues/requests for orders to patient's Primary Care Physician Electronic Signature(s) Signed: 08/07/2019 4:18:13 PM By: Linton Ham MD Signed: 08/30/2019 11:50:22 AM By: Gretta Cool, BSN, RN, CWS, Kim RN, BSN Entered By: Gretta Cool, BSN, RN, CWS, Kim on 08/07/2019 09:38:06 [name], [name] -------------------------------------------------------------------------------- Problem List Details Patient Name: [name] Date of Service: 08/07/2019 9:15 AM Medical Record Number: [id number] Patient Account Number: [id number] Date of Birth/Sex: 06-Sep-1970 (49 y.o. F) Treating RN: Cornell Barman Primary Care Provider: Alma Friendly Other  Clinician: Referring Provider: Alma Friendly Treating Provider/Extender: Tito Dine  in Treatment: 7 Active Problems ICD-10 Evaluated Encounter Code Description Active Date Today Diagnosis S61.401D Unspecified open wound of right hand, subsequent encounter 06/19/2019 No Yes E11.622 Type 2 diabetes mellitus with other skin ulcer 06/19/2019 No Yes Inactive Problems ICD-10 Code Description Active Date Inactive Date L97.211 Non-pressure chronic ulcer of right calf limited to breakdown of skin 06/19/2019 06/19/2019 L97.821 Non-pressure chronic ulcer of other part of left lower leg limited to 06/19/2019 06/19/2019 breakdown of skin I89.0 Lymphedema, not elsewhere classified 06/19/2019 06/19/2019 S61.402D Unspecified open wound of left hand, subsequent encounter 06/19/2019 06/19/2019 Resolved Problems Electronic Signature(s) Signed: 08/07/2019 4:18:13 PM By: Linton Ham MD Entered By: Linton Ham on 08/07/2019 09:40:19 [name], [name] -------------------------------------------------------------------------------- Progress Note Details Patient Name: [name] Date of Service: 08/07/2019 9:15 AM Medical Record Number: [id number] Patient Account Number: [id number] Date of Birth/Sex: 1970/12/05 (49 y.o. F) Treating RN: Cornell Barman Primary Care Provider: Alma Friendly Other Clinician: Referring Provider: Alma Friendly Treating Provider/Extender: Tito Dine in Treatment: 7 Subjective History of Present Illness (HPI) ADMISSION 06/19/2019 Patient is a 49 year old woman who is accompanied by her husband. She has severe diabetic peripheral neuropathy which is left her minimally ambulatory. She spends most of the time in a wheelchair. She also has a history of chronic lower extremity edema history of left leg DVT. She states that on 2 separate occasions roughly a month ago she fell with lacerations on the bilateral anterior tibial areas. She was seen  by primary care on 06/04/2019 diagnosed with cellulitis of the left leg put on Bactrim DS. Also noted to have hand wounds with a question of referral to hand surgery. She was also sent to our clinic at the same time. The patient's husband is doing the dressings. He has been using silver alginate that was supplied by home health. He is done a really good job the area on the left leg is actually healed only a small superficial area on the right Somewhere in the in the last 2 weeks she gives a bizarre history of going to wash nail polish off the fingers of both hands. She apparently did not realize that her son had put Clorox in the vinegar jar that they used to clean the countertop. She ended up burning her hands. The patient has a wound on the PIP of the left third finger, PIP of the right second finger and the lateral part of the right third finger Past medical history includes COPD, obstructive sleep apnea on nocturnal O2, type 2 diabetes with peripheral neuropathy, retinopathy, chronic diastolic heart failure coronary artery disease, severe diabetic neuropathy type 2 diabetes apparently diagnosed at age 26 on insulin, COPD, continued tobacco abuse, left leg deep DVT, hypertension, TIAs, seizure disorders, asthma The patient is actually had arterial studies in February of this year. ABIs bilaterally were 1.02 and TBI's bilaterally at 0.6. Waveforms were triphasic and biphasic. She was not felt to have significant arterial disease. 12/23; patient readmitted the clinic last week. She has predominantly venous insufficiency wounds on the right medial lower leg. She also had open areas on the fingers of both hands which happened in a very bizarre way. She has almost complete healing on the right medial calf. The area on the left third finger is healed. She still has 2 open wounds on the medial aspect of the PIP of the right second finger and the dorsal aspect of the right PIP of the third finger.  Of these 2 wounds the PIP of the third finger  wound is painful and deeper 12/30; the areas on her right medial calf are closed. She likely has some degree of chronic venous insufficiency and lymphedema. Also very xerotic skin which I have recommended a moisturizing. I did not bring up the issue of compression stockings The areas we are still looking out are on the PIP of the right third finger and the medial part of the PIP of the right second finger. The second finger area is just about closed. The problem is over the PIP of the right third finger. This does not look infected and she is completing her antibiotics from last week. However this is a deep wound at the base of this there is either tendon or periosteum. She she has some healthy granulation but we are not seeing that close over the bottom part of the wound. We are probably going to need to immobilize the finger somewhat so that she does not continuously pull the tissue apart 07/17/2019; right medial calf remains closed and the right second finger is closed. The sole problem here is an area over the PIP of the third finger. These wounds were initially advertised as burn injuries that happened in a very bizarre way [please see my initial discussion on this]. I gave her doxycycline last week because of some erythema around the wound however she did not tolerate this very well because of nausea. She stopped this within the last day or 2. we have been using silver collagen 1/13; the only wound we are looking at is the right third finger over the PIP. We are using endoform. X-ray ordered last time showed no underlying bone abnormalities 1/20; right third finger over the PIP. Still debris at the surface we have been using endoform. We are making some [name], [name] ([id number]) improvements in surface area and volume but still requiring debridement. 1/27; right third finger over the PIP. Still debris on the surface of the wound and maceration.  We have been using endoform. This wound initially went to the periosteum and it certainly is off that but is certainly stalled over the last 2 or 3 weeks Objective Constitutional Sitting or standing Blood Pressure is within target range for patient.. Pulse regular and within target range for patient.Marland Kitchen Respirations regular, non-labored and within target range.. Temperature is normal and within the target range for the patient.Marland Kitchen appears in no distress. Vitals Time Taken: 9:22 AM, Height: 64 in, Weight: 286 lbs, BMI: 49.1, Temperature: 97.8 F, Pulse: 87 bpm, Respiratory Rate: 16 breaths/min, Blood Pressure: 119/64 mmHg. General Notes: Wound exam; wound over the dorsal PIP of the right third finger. Once again debridement required with a curette removing adherent subcutaneous debris. I am able to get to a healthy granulated surface. This is certainly a lot better than the periosteum that we had when she first came here however it is not changed much in the last several weeks. There is no evidence of surrounding infection. Integumentary (Hair, Skin) Wound #5 status is Open. Original cause of wound was Trauma. The wound is located on the Right Hand - 3rd Digit. The wound measures 0.3cm length x 0.3cm width x 0.2cm depth; 0.071cm^2 area and 0.014cm^3 volume. There is Fat Layer (Subcutaneous Tissue) Exposed exposed. There is no tunneling or undermining noted. There is a medium amount of sanguinous drainage noted. The wound margin is thickened. There is small (1-33%) pink granulation within the wound bed. There is a large (67-100%) amount of necrotic tissue within the wound bed including Adherent Slough. Assessment  Active Problems ICD-10 Unspecified open wound of right hand, subsequent encounter Type 2 diabetes mellitus with other skin ulcer Procedures Wound #5 Pre-procedure diagnosis of Wound #5 is a Trauma, Other located on the Right Hand - 3rd Digit . There was a Excisional Skin/Subcutaneous  Tissue Debridement with a total area of 0.12 sq cm performed by Ricard Dillon, MD. With the following instrument(s): Curette to remove Viable and Non-Viable tissue/material. Material removed includes Subcutaneous Abreu, Calli ([id number]) Tissue, Slough, and Biofilm after achieving pain control using Lidocaine. No specimens were taken. A time out was conducted at 09:32, prior to the start of the procedure. A Minimum amount of bleeding was controlled with Pressure. The procedure was tolerated well. Post Debridement Measurements: 0.3cm length x 0.4cm width x 0.3cm depth; 0.028cm^3 volume. Character of Wound/Ulcer Post Debridement is stable. Post procedure Diagnosis Wound #5: Same as Pre-Procedure Plan Wound Cleansing: Wound #5 Right Hand - 3rd Digit: Clean wound with Normal Saline. May Shower, gently pat wound dry prior to applying new dressing. Anesthetic (add to Medication List): Wound #5 Right Hand - 3rd Digit: Topical Lidocaine 4% cream applied to wound bed prior to debridement (In Clinic Only). Primary Wound Dressing: Wound #5 Right Hand - 3rd Digit: Iodoflex Secondary Dressing: Wound #5 Right Hand - 3rd Digit: Non-adherent pad - Splint secured with Conform and tape Dressing Change Frequency: Wound #5 Right Hand - 3rd Digit: Change Dressing Monday, Wednesday, Friday Follow-up Appointments: Wound #5 Right Hand - 3rd Digit: Return Appointment in 1 week. Additional Orders / Instructions: Wound #5 Right Hand - 3rd Digit: Other: - Keep finger as straight as possible. Home Health: Wound #5 Right Hand - 3rd Digit: [place] Nurse may visit PRN to address patient s wound care needs. FACE TO FACE ENCOUNTER: MEDICARE and MEDICAID PATIENTS: I certify that this patient is under my care and that I had a face-to-face encounter that meets the physician face-to-face encounter requirements with this patient on this date. The encounter with  the patient was in whole or in part for the following MEDICAL CONDITION: (primary reason for [place]) MEDICAL NECESSITY: I certify, that based on my findings, NURSING services are a medically necessary home health service. HOME BOUND STATUS: I certify that my clinical findings support that this patient is homebound (i.e., Due to illness or injury, pt requires aid of supportive devices such as crutches, cane, wheelchairs, walkers, the use of special transportation or the assistance of another person to leave their place of residence. There is a normal inability to leave the home and doing so requires considerable and taxing effort. Other absences are for medical reasons / religious services and are infrequent or of short duration when for other reasons). If current dressing causes regression in wound condition, may D/C ordered dressing product/s and apply Normal Saline Moist Dressing daily until next [place] / Other MD appointment. [place] of regression in wound condition at [phone]. Please direct any NON-WOUND related issues/requests for orders to patient's Primary Care Physician 1. Right hand third digit. Stalled wound. I changed to Iodoflex instead of the silver collagen 2 I am not sure if there is an option for an advanced treatment product perhaps an amniotic membrane product Vantrease, Nancy ([id number]) 3. There is not obvious infection here. Plain x-ray I did previously was negative. No surrounding erythema tenderness or purulent drainage Electronic Signature(s) Signed: 08/07/2019 4:18:13 PM By: Linton Ham MD Entered By: Linton Ham on  08/07/2019 09:48:35 [name], [name] -------------------------------------------------------------------------------- SuperBill Details Patient Name: [name] Date of Service: 08/07/2019 Medical Record Number: [id number] Patient Account Number: [id number] Date of Birth/Sex: 1970-12-08 (49  y.o. F) Treating RN: Cornell Barman Primary Care Provider: Alma Friendly Other Clinician: Referring Provider: Alma Friendly Treating Provider/Extender: Tito Dine in Treatment: 7 Diagnosis Coding ICD-10 Codes Code Description [id number] Unspecified open wound of right hand, subsequent encounter E11.622 Type 2 diabetes mellitus with other skin ulcer Facility Procedures CPT4 Code: [id number] Description: [id number] - DEB SUBQ TISSUE 20 SQ CM/< ICD-10 Diagnosis Description S61.401D Unspecified open wound of right hand, subsequent encou Modifier: nter Quantity: 1 Physician Procedures CPT4 Code: [id number] Description: [id number] - WC PHYS SUBQ TISS 20 SQ CM ICD-10 Diagnosis Description S61.401D Unspecified open wound of right hand, subsequent encou Modifier: nter Quantity: 1 Electronic Signature(s) Signed: 08/07/2019 4:18:13 PM By: Linton Ham MD Entered By: Linton Ham on 08/07/2019 09:48:52

## 2019-08-30 NOTE — Assessment & Plan Note (Addendum)
Definite vaginal source. No blood in urin or stool. Refer for [place] of pelvis and to [facility] for likely endometrial biopsy.  Eval cbc to determine if Hg worsening.

## 2019-08-30 NOTE — Progress Notes (Signed)
IGNACIO, SHULAR ([id number]) Visit Report for 08/07/2019 Arrival Information Details Patient Name: ZARIANNA, FROEHLICH Date of Service: 08/07/2019 9:15 AM Medical Record Number: [id number] Patient Account Number: [id number] Date of Birth/Sex: Jun 03, 1971 (49 y.o. F) Treating RN: Army Melia Primary Care Ambika Zettlemoyer: Alma Friendly Other Clinician: Referring Jennica Tagliaferri: Alma Friendly Treating Saachi Zale/Extender: Tito Dine in Treatment: 7 Visit Information History Since Last Visit Added or deleted any medications: No Patient Arrived: Wheel Chair Any new allergies or adverse reactions: No Arrival Time: 09:22 Had a fall or experienced change in No Accompanied By: spouse activities of daily living that may affect Transfer Assistance: None risk of falls: Patient Identification Verified: Yes Signs or symptoms of abuse/neglect since last visito No Patient Has Alerts: Yes Hospitalized since last visit: No Patient Alerts: ABI L/R 1.02 TBI L/R 0.6 Has Dressing in Place as Prescribed: Yes Pain Present Now: Yes Electronic Signature(s) Signed: 08/07/2019 11:14:16 AM By: Army Melia Entered By: Army Melia on 08/07/2019 09:22:19 Brindle, [name] ([id number]) -------------------------------------------------------------------------------- Encounter Discharge Information Details Patient Name: [name] Date of Service: 08/07/2019 9:15 AM Medical Record Number: [id number] Patient Account Number: [id number] Date of Birth/Sex: May 09, 1971 (49 y.o. F) Treating RN: Cornell Barman Primary Care Tameya Kuznia: Alma Friendly Other Clinician: Referring Shannon Balthazar: Alma Friendly Treating Ashaya Raftery/Extender: Tito Dine in Treatment: 7 Encounter Discharge Information Items Post Procedure Vitals Discharge Condition: Stable Temperature (F): 97.8 Ambulatory Status: Wheelchair Pulse (bpm): 87 Discharge Destination: Home Respiratory Rate (breaths/min): 18 Transportation: Private Auto Blood  Pressure (mmHg): 119/64 Accompanied By: husband Schedule Follow-up Appointment: Yes Clinical Summary of Care: Electronic Signature(s) Signed: 08/30/2019 11:50:22 AM By: Gretta Cool, BSN, RN, CWS, Kim RN, BSN Entered By: Gretta Cool, BSN, RN, CWS, Kim on 08/07/2019 09:41:36 [name] ([id number]) -------------------------------------------------------------------------------- Lower Extremity Assessment Details Patient Name: [name] Date of Service: 08/07/2019 9:15 AM Medical Record Number: [id number] Patient Account Number: [id number] Date of Birth/Sex: [birth year] (49 y.o. F) Treating RN: Army Melia Primary Care Tysean Vand[place]: Alma Friendly Other Clinician: Referring Neyda Durango: Alma Friendly Treating Zaia Carre/Extender: Ricard Dillon Weeks in Treatment: 7 Electronic Signature(s) Signed: 08/07/2019 11:14:16 AM By: Army Melia Entered By: Army Melia on 08/07/2019 09:24:35 Franchi, Judieth ([id number]) -------------------------------------------------------------------------------- Multi Wound Chart Details Patient Name: [name] Date of Service: 08/07/2019 9:15 AM Medical Record Number: [id number] Patient Account Number: [id number] Date of Birth/Sex: 1971-05-23 (49 y.o. F) Treating RN: Cornell Barman Primary Care Kailia Starry: Alma Friendly Other Clinician: Referring Keeleigh Terris: Alma Friendly Treating [place] Mielke/Extender: Tito Dine in Treatment: 7 Vital Signs Height(in): 64 Pulse(bpm): 54 Weight(lbs): 286 Blood Pressure(mmHg): 119/64 Body Mass Index(BMI): 49 Temperature(F): 97.8 Respiratory Rate 16 (breaths/min): Photos: [N/A:N/A] Wound Location: Right Hand - 3rd Digit N/A N/A Wounding Event: Trauma N/A N/A Primary Etiology: Trauma, Other N/A N/A Comorbid History: Asthma, Chronic Obstructive N/A N/A Pulmonary Disease (COPD), Sleep Apnea, Arrhythmia, Congestive Heart Failure, Coronary Artery Disease, Hypertension, Type II Diabetes Date Acquired: 05/20/2019  N/A N/A Weeks of Treatment: 7 N/A N/A Wound Status: Open N/A N/A Measurements L x W x D 0.3x0.3x0.2 N/A N/A (cm) Area (cm) : 0.071 N/A N/A Volume (cm) : 0.014 N/A N/A % Reduction in Area: 87.40% N/A N/A % Reduction in Volume: 87.60% N/A N/A Classification: Full Thickness Without N/A N/A Exposed Support Structures Exudate Amount: Medium N/A N/A Exudate Type: Sanguinous N/A N/A Exudate Color: red N/A N/A Wound Margin: Thickened N/A N/A Granulation Amount: Small (1-33%) N/A N/A Granulation Quality: Pink N/A N/A Necrotic Amount: Large (67-100%) N/A N/A Exposed Structures: Fat Layer (Subcutaneous N/A N/A Tissue) Exposed: Yes Januszewski, Johnay ([id number])  Fascia: No Tendon: No Muscle: No Joint: No Bone: No Epithelialization: None N/A N/A Debridement: Debridement - Excisional N/A N/A Pre-procedure 09:32 N/A N/A Verification/Time Out Taken: Pain Control: Lidocaine N/A N/A Tissue Debrided: Subcutaneous, Slough N/A N/A Level: Skin/Subcutaneous Tissue N/A N/A Debridement Area (sq cm): 0.12 N/A N/A Instrument: Curette N/A N/A Bleeding: Minimum N/A N/A Hemostasis Achieved: Pressure N/A N/A Debridement Treatment Procedure was tolerated well N/A N/A Response: Post Debridement 0.3x0.4x0.3 N/A N/A Measurements L x W x D (cm) Post Debridement Volume: 0.028 N/A N/A (cm) Procedures Performed: Debridement N/A N/A Treatment Notes Wound #5 (Right Hand - 3rd Digit) Notes Endoform, non-adherent pad, conform - Electronic Signature(s) Signed: 08/07/2019 4:18:13 PM By: Linton Ham MD Entered By: Linton Ham on 08/07/2019 09:41:59 Caba, [name] ([id number]) -------------------------------------------------------------------------------- Multi-Disciplinary Care Plan Details Patient Name: [name] Date of Service: 08/07/2019 9:15 AM Medical Record Number: [id number] Patient Account Number: [id number] Date of Birth/Sex: Jan 30, 1971 (49 y.o. F) Treating RN: Cornell Barman Primary Care  [place]more List: Alma Friendly Other Clinician: Referring Jerrian Mells: Alma Friendly Treating Liz Pinho/Extender: Tito Dine in Treatment: 7 Active Inactive Abuse / Safety / Falls / Self Care Management Nursing Diagnoses: History of Falls Goals: Patient will not develop complications from immobility Date Initiated: 06/19/2019 Target Resolution Date: 07/10/2019 Goal Status: Active Patient will remain injury free related to falls Date Initiated: 06/19/2019 Target Resolution Date: 07/10/2019 Goal Status: Active Interventions: Assess fall risk on admission and as needed Treatment Activities: Patient referred to home care : 06/19/2019 Notes: Orientation to the Wound Care Program Nursing Diagnoses: Knowledge deficit related to the wound healing center program Goals: Patient/caregiver will verbalize understanding of the [facility] Date Initiated: 06/19/2019 Target Resolution Date: 07/10/2019 Goal Status: Active Interventions: Provide education on orientation to the wound center Notes: Peripheral Neuropathy Nursing Diagnoses: Knowledge deficit related to disease process and management of peripheral neurovascular dysfunction Potential alteration in peripheral tissue perfusion (select prior to confirmation of diagnosis) Boileau, [name] ([id number]) Goals: Patient/caregiver will verbalize understanding of disease process and disease management Date Initiated: 06/19/2019 Target Resolution Date: 07/10/2019 Goal Status: Active Interventions: Assess signs and symptoms of neuropathy upon admission and as needed Treatment Activities: Patient referred for customized footwear/offloading : 06/19/2019 Notes: Wound/Skin Impairment Nursing Diagnoses: Impaired tissue integrity Goals: Patient/caregiver will verbalize understanding of skin care regimen Date Initiated: 06/19/2019 Target Resolution Date: 07/10/2019 Goal Status: Active Ulcer/skin breakdown will have a  volume reduction of 30% by week 4 Date Initiated: 06/19/2019 Target Resolution Date: 07/20/2019 Goal Status: Active Interventions: Assess patient/caregiver ability to obtain necessary supplies Assess ulceration(s) every visit Treatment Activities: Skin care regimen initiated : 06/19/2019 Topical wound management initiated : 06/19/2019 Notes: Electronic Signature(s) Signed: 08/30/2019 11:50:22 AM By: Gretta Cool, BSN, RN, CWS, Kim RN, BSN Entered By: Gretta Cool, BSN, RN, CWS, Kim on 08/07/2019 09:32:18 [place], [name] ([id number]) -------------------------------------------------------------------------------- Pain Assessment Details Patient Name: [name] Date of Service: 08/07/2019 9:15 AM Medical Record Number: [id number] Patient Account Number: [id number] Date of Birth/Sex: October 31, 1970 (48 y.o. F) Treating RN: Army Melia Primary Care Amara Justen: Alma Friendly Other Clinician: Referring Monta Police: Alma Friendly Treating Chantella Creech/Extender: Tito Dine in Treatment: 7 Active Problems Location of Pain Severity and Description of Pain Patient Has Paino Yes Site Locations Pain Location: Pain in Ulcers Rate the pain. Current Pain Level: 7 Pain Management and Medication Current Pain Management: Electronic Signature(s) Signed: 08/07/2019 11:14:16 AM By: Army Melia Entered By: Army Melia on 08/07/2019 09:22:27 Ferrick, [name] ([id number]) -------------------------------------------------------------------------------- Patient/Caregiver Education Details Patient Name: [name] Date of Service:  08/07/2019 9:15 AM Medical Record Number: [id number] Patient Account Number: [id number] Date of Birth/Gender: 08-Jul-1971 (49 y.o. F) Treating RN: Cornell Barman Primary Care Physician: Alma Friendly Other Clinician: Referring Physician: Alma Friendly Treating Physician/Extender: Tito Dine in Treatment: 7 Education Assessment Education Provided  To: Patient Education Topics Provided Wound Debridement: Handouts: Wound Debridement Methods: Demonstration, Explain/Verbal Responses: State content correctly Wound/Skin Impairment: Handouts: Caring for Your Ulcer Methods: Demonstration, Explain/Verbal Responses: State content correctly Electronic Signature(s) Signed: 08/30/2019 11:50:22 AM By: Gretta Cool, BSN, RN, CWS, Kim RN, BSN Entered By: Gretta Cool, BSN, RN, CWS, Kim on 08/07/2019 09:38:44 [name] ([id number]) -------------------------------------------------------------------------------- Wound Assessment Details Patient Name: [name] Date of Service: 08/07/2019 9:15 AM Medical Record Number: [id number] Patient Account Number: [id number] Date of Birth/Sex: Nov 02, 1970 (49 y.o. F) Treating RN: Army Melia Primary Care Yasamin Karel: Alma Friendly Other Clinician: Referring Luvina Poirier: Alma Friendly Treating Honor Frison/Extender: Tito Dine in Treatment: 7 Wound Status Wound Number: 5 Primary Trauma, Other Etiology: Wound Location: Right Hand - 3rd Digit Wound Open Wounding Event: Trauma Status: Date Acquired: 05/20/2019 Comorbid Asthma, Chronic Obstructive Pulmonary Disease Weeks Of Treatment: 7 History: (COPD), Sleep Apnea, Arrhythmia, Congestive Clustered Wound: No Heart Failure, Coronary Artery Disease, Hypertension, Type II Diabetes Photos Wound Measurements Length: (cm) 0.3 Width: (cm) 0.3 Depth: (cm) 0.2 Area: (cm) 0.071 Volume: (cm) 0.014 % Reduction in Area: 87.4% % Reduction in Volume: 87.6% Epithelialization: None Tunneling: No Undermining: No Wound Description Full Thickness Without Exposed Support Classification: Structures Wound Margin: Thickened Exudate Medium Amount: Exudate Type: Sanguinous Exudate Color: red Foul Odor After Cleansing: No Slough/Fibrino Yes Wound Bed Granulation Amount: Small (1-33%) Exposed Structure Granulation Quality: Pink Fascia Exposed:  No Necrotic Amount: Large (67-100%) Fat Layer (Subcutaneous Tissue) Exposed: Yes Necrotic Quality: Adherent Slough Tendon Exposed: No Muscle Exposed: No Joint Exposed: No Bone Exposed: No Winkowski, Shavon ([id number]) Electronic Signature(s) Signed: 08/07/2019 11:14:16 AM By: Army Melia Entered By: Army Melia on 08/07/2019 09:24:23 Ullom, [name] ([id number]) -------------------------------------------------------------------------------- Vitals Details Patient Name: [name] Date of Service: 08/07/2019 9:15 AM Medical Record Number: [id number] Patient Account Number: [id number] Date of Birth/Sex: [birth year] (49 y.o. F) Treating RN: Army Melia Primary Care Aily Tzeng: Alma Friendly Other Clinician: Referring Lamarion Mcevers: Alma Friendly Treating Adante Courington/Extender: Tito Dine in Treatment: 7 Vital Signs Time Taken: 09:22 Temperature (F): 97.8 Height (in): 64 Pulse (bpm): 87 Weight (lbs): 286 Respiratory Rate (breaths/min): 16 Body Mass Index (BMI): 49.1 Blood Pressure (mmHg): 119/64 Reference Range: 80 - 120 mg / dl Electronic Signature(s) Signed: 08/07/2019 11:14:16 AM By: Army Melia Entered By: Army Melia on 08/07/2019 09:22:54

## 2019-09-04 ENCOUNTER — Other Ambulatory Visit: Payer: Self-pay

## 2019-09-04 ENCOUNTER — Encounter: Payer: BC Managed Care – PPO | Admitting: Internal Medicine

## 2019-09-04 DIAGNOSIS — B9561 Methicillin susceptible Staphylococcus aureus infection as the cause of diseases classified elsewhere: Secondary | ICD-10-CM | POA: Diagnosis not present

## 2019-09-04 DIAGNOSIS — E11622 Type 2 diabetes mellitus with other skin ulcer: Secondary | ICD-10-CM | POA: Diagnosis not present

## 2019-09-04 DIAGNOSIS — Z880 Allergy status to penicillin: Secondary | ICD-10-CM | POA: Diagnosis not present

## 2019-09-04 DIAGNOSIS — L98492 Non-pressure chronic ulcer of skin of other sites with fat layer exposed: Secondary | ICD-10-CM | POA: Diagnosis not present

## 2019-09-04 DIAGNOSIS — S61203A Unspecified open wound of left middle finger without damage to nail, initial encounter: Secondary | ICD-10-CM | POA: Diagnosis not present

## 2019-09-09 ENCOUNTER — Other Ambulatory Visit: Payer: Self-pay

## 2019-09-09 DIAGNOSIS — E1165 Type 2 diabetes mellitus with hyperglycemia: Secondary | ICD-10-CM

## 2019-09-09 MED ORDER — ONETOUCH VERIO VI STRP: 1.0000 | ORAL_STRIP | Freq: Three times a day (TID) | 2 refills | Status: DC

## 2019-09-10 ENCOUNTER — Ambulatory Visit: Payer: BC Managed Care – PPO | Admitting: Adult Health

## 2019-09-11 ENCOUNTER — Ambulatory Visit: Payer: BC Managed Care – PPO | Admitting: Internal Medicine

## 2019-09-13 NOTE — Progress Notes (Addendum)
[name] ([id number]) Visit Report for 09/04/2019 Arrival Information Details Patient Name: [name] Date of Service: 09/04/2019 9:15 AM Medical Record Number: [id number] Patient Account Number: [id number] Date of Birth/Sex: [birth year] (49 y.o. F) Treating RN: Army Melia Primary Care Tehya Leath: Alma Friendly Other Clinician: Referring Adriell Polansky: Alma Friendly Treating Rakeb Kibble/Extender: Tito Dine in Treatment: 11 Visit Information History Since Last Visit Added or deleted any medications: No Patient Arrived: Wheel Chair Any new allergies or adverse reactions: No Arrival Time: 09:22 Had a fall or experienced change in No Accompanied By: husband activities of daily living that may affect Transfer Assistance: None risk of falls: Patient Identification Verified: Yes Signs or symptoms of abuse/neglect since last visito No Patient Has Alerts: Yes Hospitalized since last visit: No Patient Alerts: ABI L/R 1.02 TBI L/R 0.6 Has Dressing in Place as Prescribed: Yes Pain Present Now: Yes Electronic Signature(s) Signed: 09/04/2019 4:52:55 PM By: Army Melia Entered By: Army Melia on 09/04/2019 09:23:09 [name] ([id number]) -------------------------------------------------------------------------------- Clinic Level of Care Assessment Details Patient Name: [name] Date of Service: 09/04/2019 9:15 AM Medical Record Number: [id number] Patient Account Number: [id number] Date of Birth/Sex: 1970-12-08 (48 y.o. F) Treating RN: Cornell Barman Primary Care Altair Appenzeller: Alma Friendly Other Clinician: Referring Lindell Renfrew: Alma Friendly Treating Lorcan Shelp/Extender: Tito Dine in Treatment: 11 Clinic Level of Care Assessment Items TOOL 3 Quantity Score []  - Use when EandM and Procedure is performed on FOLLOW-UP visit 0 ASSESSMENTS - Nursing Assessment / Reassessment []  - Reassessment of Co-morbidities (includes updates in patient status) 0 []  -  0 Reassessment of Adherence to Treatment Plan ASSESSMENTS - Wound and Skin Assessment / Reassessment []  - Points for Wound Assessment can only be taken for a new wound of unknown or different etiology and a 0 procedure is NOT performed to that wound X- 1 5 Simple Wound Assessment / Reassessment - one wound []  - 0 Complex Wound Assessment / Reassessment - multiple wounds []  - 0 Dermatologic / Skin Assessment (not related to wound area) ASSESSMENTS - Focused Assessment []  - Circumferential Edema Measurements - multi extremities 0 []  - 0 Nutritional Assessment / Counseling / Intervention []  - 0 Lower Extremity Assessment (monofilament, tuning fork, pulses) []  - 0 Peripheral Arterial Disease Assessment (using hand held doppler) ASSESSMENTS - Ostomy and/or Continence Assessment and Care []  - Incontinence Assessment and Management 0 []  - 0 Ostomy Care Assessment and Management (repouching, etc.) PROCESS - Coordination of Care []  - Points for Discharge Coordination can only be taken for a new wound of unknown or different etiology and a 0 procedure is NOT performed to that wound X- 1 15 Simple Patient / Family Education for ongoing care []  - 0 Complex (extensive) Patient / Family Education for ongoing care []  - 0 Staff obtains Programmer, systems, Records, Test Results / Process Orders []  - 0 Staff telephones HHA, Nursing Homes / Clarify orders / etc []  - 0 Routine Transfer to another Facility (non-emergent condition) []  - 0 Routine Hospital Admission (non-emergent condition) []  - 0 New Admissions / Biomedical engineer / Ordering NPWT, Apligraf, etc. []  - 0 Emergency Hospital Admission (emergent condition) X- 1 10 Simple Discharge Coordination []  - 0 Complex (extensive) Discharge Coordination PROCESS - Special Needs []  - Pediatric / Minor Patient Management 0 [name] ([id number]) []  - 0 Isolation Patient Management []  - 0 Hearing / Language / Visual special needs []  -  0 Assessment of Community assistance (transportation, D/C planning, etc.) []  - 0 Additional assistance / Altered mentation []  -  0 Support Surface(s) Assessment (bed, cushion, seat, etc.) INTERVENTIONS - Wound Cleansing / Measurement []  - Points for Wound Cleaning / Measurement, Wound Dressing, Specimen Collection and Specimen taken to lab 0 can only be taken for a new wound of unknown or different etiology and a procedure is NOT performed to that wound X- 1 5 Simple Wound Cleansing - one wound []  - 0 Complex Wound Cleansing - multiple wounds X- 1 5 Wound Imaging (photographs - any number of wounds) []  - 0 Wound Tracing (instead of photographs) X- 1 5 Simple Wound Measurement - one wound []  - 0 Complex Wound Measurement - multiple wounds INTERVENTIONS - Wound Dressings []  - Small Wound Dressing one or multiple wounds 0 X- 1 15 Medium Wound Dressing one or multiple wounds []  - 0 Large Wound Dressing one or multiple wounds INTERVENTIONS - Miscellaneous []  - External ear exam 0 []  - 0 Specimen Collection (cultures, biopsies, blood, body fluids, etc.) []  - 0 Specimen(s) / Culture(s) sent or taken to Lab for analysis []  - 0 Patient Transfer (multiple staff / Civil Service fast streamer / Similar devices) []  - 0 Simple Staple / Suture removal (25 or less) []  - 0 Complex Staple / Suture removal (26 or more) []  - 0 Hypo / Hyperglycemic Management (close monitor of Blood Glucose) []  - 0 Ankle / Brachial Index (ABI) - do not check if billed separately X- 1 5 Vital Signs Has the patient been seen at the hospital within the last three years: Yes Total Score: 65 Level Of Care: New/Established - Level 2 Notes New wound different location and etiology. Electronic Signature(s) Signed: 09/13/2019 5:40:44 PM By: Gretta Cool, BSN, RN, CWS, Kim RN, BSN Entered By: Gretta Cool, BSN, RN, CWS, Kim on 09/04/2019 09:49:08 [name] -------------------------------------------------------------------------------- Lower Extremity Assessment Details Patient Name: [name] Date of Service: 09/04/2019 9:15 AM Medical Record Number: [id number] Patient Account Number: [id number] Date of Birth/Sex: [birth year] (49 y.o. F) Treating RN: Army Melia Primary Care Savana Spina: Alma Friendly Other Clinician: Referring Maniya Donovan: Alma Friendly Treating Elizabeth Haff/Extender: Tito Dine in Treatment: 11 Electronic Signature(s) Signed: 09/04/2019 4:52:55 PM By: Army Melia Entered By: Army Melia on 09/04/2019 09:28:02 [place], [place] ([id number]) -------------------------------------------------------------------------------- Multi Wound Chart Details Patient Name: [name] Date of Service: 09/04/2019 9:15 AM Medical Record Number: [id number] Patient Account Number: [id number] Date of Birth/Sex: September 03, 1970 (49 y.o. F) Treating RN: Cornell Barman Primary Care Tempest Frankland: Alma Friendly Other Clinician: Referring Jashawna Reever: Alma Friendly Treating Gianpaolo Mindel/Extender: Tito Dine in Treatment: 11 Vital Signs Height(in): 71 Pulse(bpm): 29 Weight(lbs): 286 Blood Pressure(mmHg): 136/52 Body Mass Index(BMI): 49 Temperature(F): 98.1 Respiratory Rate(breaths/min): 16 Photos: [N/A:N/A] Wound Location: Right Hand - 3rd Digit Left Hand - 3rd Digit N/A Wounding Event: Trauma Gradually Appeared N/A Primary Etiology: Trauma, Other Atypical N/A Comorbid History: Asthma, Chronic Obstructive Asthma, Chronic Obstructive N/A Pulmonary Disease (COPD), Sleep Pulmonary Disease (COPD), Sleep Apnea, Arrhythmia, Congestive Apnea, Arrhythmia, Congestive Heart Failure, Coronary Artery Heart Failure, Coronary Artery Disease, Hypertension, Type II Disease, Hypertension, Type II Diabetes Diabetes Date Acquired: 05/20/2019 08/26/2019 N/A Weeks of Treatment: 11 0 N/A Wound Status: Open Open N/A Measurements L  x W x D (cm) 0.2x0.2x0.2 0.8x0.8x0.2 N/A Area (cm) : 0.031 0.503 N/A Volume (cm) : 0.006 0.101 N/A % Reduction in Area: 94.50% -52.40% N/A % Reduction in Volume: 94.70% -206.10% N/A Classification: Full Thickness Without Exposed Unclassifiable N/A Support Structures Exudate Amount: Medium None Present N/A Exudate Type: Sanguinous N/A N/A Exudate Color: red N/A N/A Wound Margin: Thickened Flat and  Intact N/A Granulation Amount: Small (1-33%) None Present (0%) N/A Granulation Quality: Pink N/A N/A Necrotic Amount: Large (67-100%) Large (67-100%) N/A Necrotic Tissue: Adherent Slough Eschar N/A Exposed Structures: Fat Layer (Subcutaneous Tissue) Fascia: No N/A Exposed: Yes Fat Layer (Subcutaneous Tissue) Fascia: No Exposed: No Tendon: No Tendon: No Muscle: No Muscle: No Joint: No Joint: No Bone: No Bone: No Epithelialization: None N/A N/A Debridement: N/A Debridement - Excisional N/A Pre-procedure Verification/Time N/A 09:43 N/A Out Taken: GWENDELYN, VILES ([id number]) Pain Control: N/A Lidocaine N/A Tissue Debrided: N/A Necrotic/Eschar, Subcutaneous N/A Level: N/A Skin/Subcutaneous Tissue N/A Debridement Area (sq cm): N/A 0.64 N/A Instrument: N/A Curette N/A Bleeding: N/A Minimum N/A Hemostasis Achieved: N/A Pressure N/A Debridement Treatment N/A Procedure was tolerated well N/A Response: Post Debridement Measurements N/A 0.8x0.8x0.2 N/A L x W x D (cm) Post Debridement Volume: (cm) N/A 0.101 N/A Procedures Performed: N/A Debridement N/A Treatment Notes Electronic Signature(s) Signed: 09/04/2019 5:01:35 PM By: Linton Ham MD Entered By: Linton Ham on 09/04/2019 09:50:27 [place], Blair Foster ([id number]) -------------------------------------------------------------------------------- Multi-Disciplinary Care Plan Details Patient Name: [name] Date of Service: 09/04/2019 9:15 AM Medical Record Number: [id number] Patient Account Number: [id number] Date of  Birth/Sex: 07-Feb-1971 (49 y.o. F) Treating RN: Cornell Barman Primary Care Caitlin Ainley: Alma Friendly Other Clinician: Referring Providence Stivers: Alma Friendly Treating Jonus Coble/Extender: Tito Dine in Treatment: 11 Active Inactive Electronic Signature(s) Signed: 10/14/2019 2:12:50 PM By: Gretta Cool, BSN, RN, CWS, Kim RN, BSN Previous Signature: 09/13/2019 5:40:44 PM Version By: Gretta Cool, BSN, RN, CWS, Kim RN, BSN Entered By: Gretta Cool, BSN, RN, CWS, Kim on 10/14/2019 14:12:50 [name] -------------------------------------------------------------------------------- Pain Assessment Details Patient Name: [name] Date of Service: 09/04/2019 9:15 AM Medical Record Number: [id number] Patient Account Number: [id number] Date of Birth/Sex: 06-14-1971 (49 y.o. F) Treating RN: Army Melia Primary Care Shon Indelicato: Alma Friendly Other Clinician: Referring Brandace Cargle: Alma Friendly Treating Sayde Lish/Extender: Tito Dine in Treatment: 11 Active Problems Location of Pain Severity and Description of Pain Patient Has Paino Yes Site Locations Pain Location: Pain in Ulcers Rate the pain. Current Pain Level: 6 Pain Management and Medication Current Pain Management: Electronic Signature(s) Signed: 09/04/2019 4:52:55 PM By: Army Melia Entered By: Army Melia on 09/04/2019 09:24:05 [place], Blair Foster ([id number]) -------------------------------------------------------------------------------- Patient/Caregiver Education Details Patient Name: [name] Date of Service: 09/04/2019 9:15 AM Medical Record Number: [id number] Patient Account Number: [id number] Date of Birth/Gender: 1971-02-27 (49 y.o. F) Treating RN: Cornell Barman Primary Care Physician: Alma Friendly Other Clinician: Referring Physician: Alma Friendly Treating Physician/Extender: Tito Dine in Treatment: 11 Education Assessment Education Provided To: Patient Education Topics  Provided Wound/Skin Impairment: Handouts: Caring for Your Ulcer Methods: Demonstration, Explain/Verbal Responses: State content correctly Electronic Signature(s) Signed: 09/13/2019 5:40:44 PM By: Gretta Cool, BSN, RN, CWS, Kim RN, BSN Entered By: Gretta Cool, BSN, RN, CWS, Kim on 09/04/2019 09:49:33 [place], Blair Foster ([id number]) -------------------------------------------------------------------------------- Wound Assessment Details Patient Name: [name] Date of Service: 09/04/2019 9:15 AM Medical Record Number: [id number] Patient Account Number: [id number] Date of Birth/Sex: [birth year] (49 y.o. F) Treating RN: Cornell Barman Primary Care Kym Scannell: Alma Friendly Other Clinician: Referring Kedarius Aloisi: Alma Friendly Treating Donis Kotowski/Extender: Tito Dine in Treatment: 11 Wound Status Wound Number: 5 Primary Trauma, Other Etiology: Wound Location: Right Hand - 3rd Digit Wound Open Wounding Event: Trauma Status: Date Acquired: 05/20/2019 Comorbid Asthma, Chronic Obstructive Pulmonary Disease (COPD), Weeks Of Treatment: 11 History: Sleep Apnea, Arrhythmia, Congestive Heart Failure, Clustered Wound: No Coronary Artery Disease, Hypertension, Type II Diabetes Photos Wound Measurements Length: (cm) 0.2 Width: (cm) 0.2 Depth: (cm)  0.2 Area: (cm) 0.031 Volume: (cm) 0.006 % Reduction in Area: 94.5% % Reduction in Volume: 94.7% Epithelialization: None Wound Description Classification: Full Thickness Without Exposed Support Structu Wound Margin: Thickened Exudate Amount: Medium Exudate Type: Sanguinous Exudate Color: red res Foul Odor After Cleansing: No Slough/Fibrino Yes Wound Bed Granulation Amount: Small (1-33%) Exposed Structure Granulation Quality: Pink Fascia Exposed: No Necrotic Amount: Large (67-100%) Fat Layer (Subcutaneous Tissue) Exposed: Yes Necrotic Quality: Adherent Slough Tendon Exposed: No Muscle Exposed: No Joint Exposed: No Bone Exposed:  No Electronic Signature(s) Signed: 09/13/2019 5:40:44 PM By: Gretta Cool, BSN, RN, CWS, Kim RN, BSN Entered By: Gretta Cool, BSN, RN, CWS, Kim on 09/04/2019 09:45:49 [place], Blair Foster ([id number]) -------------------------------------------------------------------------------- Wound Assessment Details Patient Name: [name] Date of Service: 09/04/2019 9:15 AM Medical Record Number: [id number] Patient Account Number: [id number] Date of Birth/Sex: 07-Nov-1970 (49 y.o. F) Treating RN: Cornell Barman Primary Care Greycen Felter: Alma Friendly Other Clinician: Referring Taja Pentland: Alma Friendly Treating Cypher Paule/Extender: Tito Dine in Treatment: 11 Wound Status Wound Number: 6 Primary Atypical Etiology: Wound Location: Left Hand - 3rd Digit Wound Open Wounding Event: Gradually Appeared Status: Date Acquired: 08/26/2019 Comorbid Asthma, Chronic Obstructive Pulmonary Disease (COPD), Weeks Of Treatment: 0 History: Sleep Apnea, Arrhythmia, Congestive Heart Failure, Clustered Wound: No Coronary Artery Disease, Hypertension, Type II Diabetes Photos Wound Measurements Length: (cm) 0.8 Width: (cm) 0.8 Depth: (cm) 0.2 Area: (cm) 0.503 Volume: (cm) 0.101 % Reduction in Area: -52.4% % Reduction in Volume: -206.1% Tunneling: No Undermining: No Wound Description Classification: Unclassifiable Wound Margin: Flat and Intact Exudate Amount: None Present Foul Odor After Cleansing: No Slough/Fibrino Yes Wound Bed Granulation Amount: None Present (0%) Exposed Structure Necrotic Amount: Large (67-100%) Fascia Exposed: No Necrotic Quality: Eschar Fat Layer (Subcutaneous Tissue) Exposed: No Tendon Exposed: No Muscle Exposed: No Joint Exposed: No Bone Exposed: No Electronic Signature(s) Signed: 09/13/2019 5:40:44 PM By: Gretta Cool, BSN, RN, CWS, Kim RN, BSN Entered By: Gretta Cool, BSN, RN, CWS, Kim on 09/04/2019 09:45:49 [place], Blair Foster  ([id number]) -------------------------------------------------------------------------------- [place] Details Patient Name: [name] Date of Service: 09/04/2019 9:15 AM Medical Record Number: [id number] Patient Account Number: [id number] Date of Birth/Sex: 10/18/1970 (49 y.o. F) Treating RN: Army Melia Primary Care Jamorris Ndiaye: Alma Friendly Other Clinician: Referring Mckenley Birenbaum: Alma Friendly Treating Terissa Haffey/Extender: Tito Dine in Treatment: 11 Vital Signs Time Taken: 09:23 Temperature (F): 98.1 Height (in): 64 Pulse (bpm): 83 Weight (lbs): 286 Respiratory Rate (breaths/min): 16 Body Mass Index (BMI): 49.1 Blood Pressure (mmHg): 136/52 Reference Range: 80 - 120 mg / dl Electronic Signature(s) Signed: 09/04/2019 4:52:55 PM By: Army Melia Entered By: Army Melia on 09/04/2019 09:23:39

## 2019-09-13 NOTE — Progress Notes (Signed)
[name], [name] ([id number]) Visit Report for 09/04/2019 Debridement Details Patient Name: [name], [name] Date of Service: 09/04/2019 9:15 AM Medical Record Number: [id number] Patient Account Number: [id number] Date of Birth/Sex: 22-Mar-1971 (49 y.o. F) Treating RN: Cornell Barman Primary Care Provider: Alma Friendly Other Clinician: Referring Provider: Alma Friendly Treating Provider/Extender: Tito Dine in Treatment: 11 Debridement Performed for Wound #6 Left Hand - 3rd Digit Assessment: Performed By: Physician Ricard Dillon, MD Debridement Type: Debridement Level of Consciousness (Pre- Awake and Alert procedure): Pre-procedure Verification/Time Out Yes - 09:43 Taken: Start Time: 09:43 Pain Control: Lidocaine Total Area Debrided (L x W): 0.8 (cm) x 0.8 (cm) = 0.64 (cm) Tissue and other material debrided: Viable, Non-Viable, Eschar, Subcutaneous Level: Skin/Subcutaneous Tissue Debridement Description: Excisional Instrument: Curette Bleeding: Minimum Hemostasis Achieved: Pressure End Time: 09:45 Response to Treatment: Procedure was tolerated well Level of Consciousness (Post- Awake and Alert procedure): Post Debridement Measurements of Total Wound Length: (cm) 0.8 Width: (cm) 0.8 Depth: (cm) 0.2 Volume: (cm) 0.101 Character of Wound/Ulcer Post Debridement: Requires Further Debridement Post Procedure Diagnosis Same as Pre-procedure Electronic Signature(s) Signed: 09/04/2019 5:01:35 PM By: Linton Ham MD Signed: 09/13/2019 5:40:44 PM By: Gretta Cool, BSN, RN, CWS, Kim RN, BSN Entered By: Linton Ham on 09/04/2019 09:50:45 [name], [name] ([id number]) -------------------------------------------------------------------------------- HPI Details Patient Name: [name] Date of Service: 09/04/2019 9:15 AM Medical Record Number: [id number] Patient Account Number: [id number] Date of Birth/Sex: Sep 08, 1970 (49 y.o. F) Treating RN: Cornell Barman Primary Care  Provider: Alma Friendly Other Clinician: Referring Provider: Alma Friendly Treating Provider/Extender: Tito Dine in Treatment: 11 History of Present Illness HPI Description: ADMISSION 06/19/2019 Patient is a 49 year old woman who is accompanied by her husband. She has severe diabetic peripheral neuropathy which is left her minimally ambulatory. She spends most of the time in a wheelchair. She also has a history of chronic lower extremity edema history of left leg DVT. She states that on 2 separate occasions roughly a month ago she fell with lacerations on the bilateral anterior tibial areas. She was seen by primary care on 06/04/2019 diagnosed with cellulitis of the left leg put on Bactrim DS. Also noted to have hand wounds with a question of referral to hand surgery. She was also sent to our clinic at the same time. The patient's husband is doing the dressings. He has been using silver alginate that was supplied by home health. He is done a really good job the area on the left leg is actually healed only a small superficial area on the right Somewhere in the in the last 2 weeks she gives a bizarre history of going to wash nail polish off the fingers of both hands. She apparently did not realize that her son had put Clorox in the vinegar jar that they used to clean the countertop. She ended up burning her hands. The patient has a wound on the PIP of the left third finger, PIP of the right second finger and the lateral part of the right third finger Past medical history includes COPD, obstructive sleep apnea on nocturnal O2, type 2 diabetes with peripheral neuropathy, retinopathy, chronic diastolic heart failure coronary artery disease, severe diabetic neuropathy type 2 diabetes apparently diagnosed at age 75 on insulin, COPD, continued tobacco abuse, left leg deep DVT, hypertension, TIAs, seizure disorders, asthma The patient is actually had arterial studies in February of this  year. ABIs bilaterally were 1.02 and TBI's bilaterally at 0.6. Waveforms were triphasic and biphasic. She was not felt to have significant  arterial disease. 12/23; patient readmitted the clinic last week. She has predominantly venous insufficiency wounds on the right medial lower leg. She also had open areas on the fingers of both hands which happened in a very bizarre way. She has almost complete healing on the right medial calf. The area on the left third finger is healed. She still has 2 open wounds on the medial aspect of the PIP of the right second finger and the dorsal aspect of the right PIP of the third finger. Of these 2 wounds the PIP of the third finger wound is painful and deeper 12/30; the areas on her right medial calf are closed. She likely has some degree of chronic venous insufficiency and lymphedema. Also very xerotic skin which I have recommended a moisturizing. I did not bring up the issue of compression stockings The areas we are still looking out are on the PIP of the right third finger and the medial part of the PIP of the right second finger. The second finger area is just about closed. The problem is over the PIP of the right third finger. This does not look infected and she is completing her antibiotics from last week. However this is a deep wound at the base of this there is either tendon or periosteum. She she has some healthy granulation but we are not seeing that close over the bottom part of the wound. We are probably going to need to immobilize the finger somewhat so that she does not continuously pull the tissue apart 07/17/2019; right medial calf remains closed and the right second finger is closed. The sole problem here is an area over the PIP of the third finger. These wounds were initially advertised as burn injuries that happened in a very bizarre way [please see my initial discussion on this]. I gave her doxycycline last week because of some erythema around the  wound however she did not tolerate this very well because of nausea. She stopped this within the last day or 2. we have been using silver collagen 1/13; the only wound we are looking at is the right third finger over the PIP. We are using endoform. X-ray ordered last time showed no underlying bone abnormalities 1/20; right third finger over the PIP. Still debris at the surface we have been using endoform. We are making some improvements in surface area and volume but still requiring debridement. 1/27; right third finger over the PIP. Still debris on the surface of the wound and maceration. We have been using endoform. This wound initially went to the periosteum and it certainly is off that but is certainly stalled over the last 2 or 3 weeks 2/10; right third finger over the PIP. We switch to Iodoflex last time but she says this caused extreme erythema and blistering and they changed back to endoform. This is a wound that originally went to the periosteum. This is improved since her admission but really it is stalled. She missed her appointment last week but she states she has been using endoform. She complains of increasing pain in the finger that is making it difficult for her to sleep at times 2/17; right third finger over the PIP. This is a difficult wound small punched out area. Culture I did last week showed methicillin sensitive staph aureus however she is allergic to penicillin [widespread rash as a child] she has not had cephalosporins. Multiple drug interactions with quinolones and I was concerned about trimethoprim sulfamethoxazole in somebody with ACE inhibitors and  spironolactone. I prescribed doxycycline. This was 2 days ago. She comes in today without the antibiotic saying that it caused mental status changes fatigue and diarrhea the last time she was on this. 2/24; right third finger over the PIP. She is tolerating the doxycycline has another 3 days left. This is for the area on the  PIP of the third finger on her right. She arrives in clinic today with a thick raised eschar over the PIP of the left third finger. I talked to her about this. The exact reason is unclear. She does smoke but denies it could be a burn injury. Her husband thinks this may be trauma pushing her wheelchair but the depth of this makes this somewhat unusual. She does not appear to have any blood flow issues in either arm [name], [name] Electronic Signature(s) Signed: 09/04/2019 5:01:35 PM By: Linton Ham MD Entered By: Linton Ham on 09/04/2019 09:52:05 [name], [name] -------------------------------------------------------------------------------- Physical Exam Details Patient Name: [name] Date of Service: 09/04/2019 9:15 AM Medical Record Number: [id number] Patient Account Number: [id number] Date of Birth/Sex: 1970-08-22 (49 y.o. F) Treating RN: Cornell Barman Primary Care Provider: Alma Friendly Other Clinician: Referring Provider: Alma Friendly Treating Provider/Extender: Tito Dine in Treatment: 11 Constitutional Sitting or standing Blood Pressure is within target range for patient.. Pulse regular and within target range for patient.Marland Kitchen Respirations regular, non- labored and within target range.. Temperature is normal and within the target range for the patient.Marland Kitchen appears in no distress. Notes Wound exam oDorsal PIP of the right third finger. This looked a little more benign today. Still not a viable surface. I did not debride this. She has no tenderness over the joint but she is neuropathic. Slight erythema oDorsal PIP of the left third finger. This is a new wound today thick black eschar removed with a #5 curette necrotic debris to reveal leg punched out wound area. The exact etiology of this is not clear. Radial pulses are palpable on both sides brachial pulses also palpable. I see nothing on her hands that would suggest an embolic  phenomenon. Electronic Signature(s) Signed: 09/04/2019 5:01:35 PM By: Linton Ham MD Entered By: Linton Ham on 09/04/2019 09:55:50 [name], [name] -------------------------------------------------------------------------------- Physician Orders Details Patient Name: [name] Date of Service: 09/04/2019 9:15 AM Medical Record Number: [id number] Patient Account Number: [id number] Date of Birth/Sex: Nov 28, 1970 (49 y.o. F) Treating RN: Cornell Barman Primary Care Provider: Alma Friendly Other Clinician: Referring Provider: Alma Friendly Treating Provider/Extender: Tito Dine in Treatment: 63 Verbal / Phone Orders: No Diagnosis Coding Wound Cleansing Wound #5 Right Hand - 3rd Digit o Clean wound with Normal Saline. o May Shower, gently pat wound dry prior to applying new dressing. Wound #6 Left Hand - 3rd Digit o Clean wound with Normal Saline. o May Shower, gently pat wound dry prior to applying new dressing. Anesthetic (add to Medication List) Wound #5 Right Hand - 3rd Digit o Topical Lidocaine 4% cream applied to wound bed prior to debridement (In Clinic Only). Wound #6 Left Hand - 3rd Digit o Topical Lidocaine 4% cream applied to wound bed prior to debridement (In Clinic Only). Skin Barriers/Peri-Wound Care Wound #5 Right Hand - 3rd Digit o Other: - Bacitracin Wound #6 Left Hand - 3rd Digit o Other: - Bacitracin Primary Wound Dressing Wound #5 Right Hand - 3rd Digit o Other: - Endoform moistened with saline Wound #6 Left Hand - 3rd Digit o Other: - Endoform moistened with saline Secondary Dressing Wound #5  Right Hand - 3rd Digit o Non-adherent pad - Splint secured with Conform and tape Wound #6 Left Hand - 3rd Digit o Non-adherent pad - Splint secured with Conform and tape Dressing Change Frequency Wound #5 Right Hand - 3rd Digit o Change Dressing Monday, Wednesday, Friday Wound #6 Left Hand - 3rd Digit o  Change Dressing Monday, Wednesday, Friday Follow-up Appointments Wound #5 Right Hand - 3rd Digit o Return Appointment in 1 week. Wound #6 Left Hand - 3rd Digit o Return Appointment in 1 week. Additional Orders / Instructions Rakes, [place] ([id number]) Wound #5 Right Hand - 3rd Digit o Other: - Keep finger as straight as possible. Wound #6 Left Hand - 3rd Digit o Other: - Keep finger as straight as possible. Home Health Wound #5 Right Hand - 3rd Digit o Continue Home Health Visits - [place] Nurse may visit PRN to address patientos wound care needs. o FACE TO FACE ENCOUNTER: MEDICARE and MEDICAID PATIENTS: I certify that this patient is under my care and that I had a face-to- face encounter that meets the physician face-to-face encounter requirements with this patient on this date. The encounter with the patient was in whole or in part for the following MEDICAL CONDITION: (primary reason for [place]) MEDICAL NECESSITY: I certify, that based on my findings, NURSING services are a medically necessary home health service. HOME BOUND STATUS: I certify that my clinical findings support that this patient is homebound (i.e., Due to illness or injury, pt requires aid of supportive devices such as crutches, cane, wheelchairs, walkers, the use of special transportation or the assistance of another person to leave their place of residence. There is a normal inability to leave the home and doing so requires considerable and taxing effort. Other absences are for medical reasons / religious services and are infrequent or of short duration when for other reasons). o If current dressing causes regression in wound condition, may D/C ordered dressing product/s and apply Normal Saline Moist Dressing daily until next [place] / Other MD appointment. [place] of regression in wound condition at [phone]. o Please direct any  NON-WOUND related issues/requests for orders to patient's Primary Care Physician Wound #6 Left Hand - 3rd Digit o [place] Nurse may visit PRN to address patientos wound care needs. o FACE TO FACE ENCOUNTER: MEDICARE and MEDICAID PATIENTS: I certify that this patient is under my care and that I had a face-to- face encounter that meets the physician face-to-face encounter requirements with this patient on this date. The encounter with the patient was in whole or in part for the following MEDICAL CONDITION: (primary reason for [place]) MEDICAL NECESSITY: I certify, that based on my findings, NURSING services are a medically necessary home health service. HOME BOUND STATUS: I certify that my clinical findings support that this patient is homebound (i.e., Due to illness or injury, pt requires aid of supportive devices such as crutches, cane, wheelchairs, walkers, the use of special transportation or the assistance of another person to leave their place of residence. There is a normal inability to leave the home and doing so requires considerable and taxing effort. Other absences are for medical reasons / religious services and are infrequent or of short duration when for other reasons). o If current dressing causes regression in wound condition, may D/C ordered dressing product/s and apply Normal Saline Moist Dressing daily until next [place] /  Other MD appointment. [place] of regression in wound condition at [phone]. o Please direct any NON-WOUND related issues/requests for orders to patient's Primary Care Physician Medications-please add to medication list. Wound #5 Right Hand - 3rd Digit o P.O. Antibiotics - Complete Doxycycline Wound #6 Left Hand - 3rd Digit o P.O. Antibiotics - Complete Doxycycline Electronic Signature(s) Signed: 09/04/2019 5:01:35 PM By: Linton Ham MD Signed:  09/13/2019 5:40:44 PM By: Gretta Cool, BSN, RN, CWS, Kim RN, BSN Entered By: Gretta Cool, BSN, RN, CWS, Kim on 09/04/2019 09:47:18 [name], [name] -------------------------------------------------------------------------------- Problem List Details Patient Name: [name] Date of Service: 09/04/2019 9:15 AM Medical Record Number: [id number] Patient Account Number: [id number] Date of Birth/Sex: 1970/09/19 (49 y.o. F) Treating RN: Cornell Barman Primary Care Provider: Alma Friendly Other Clinician: Referring Provider: Alma Friendly Treating Provider/Extender: Tito Dine in Treatment: 11 Active Problems ICD-10 Evaluated Encounter Code Description Active Date Today Diagnosis S61.401D Unspecified open wound of right hand, subsequent encounter 06/19/2019 No Yes E11.622 Type 2 diabetes mellitus with other skin ulcer 06/19/2019 No Yes S61.402D Unspecified open wound of left hand, subsequent encounter 06/19/2019 No Yes Inactive Problems ICD-10 Code Description Active Date Inactive Date L97.211 Non-pressure chronic ulcer of right calf limited to breakdown of skin 06/19/2019 06/19/2019 L97.821 Non-pressure chronic ulcer of other part of left lower leg limited to breakdown of 06/19/2019 06/19/2019 skin I89.0 Lymphedema, not elsewhere classified 06/19/2019 06/19/2019 Resolved Problems Electronic Signature(s) Signed: 09/04/2019 5:01:35 PM By: Linton Ham MD Entered By: Linton Ham on 09/04/2019 09:50:13 [place], [name] -------------------------------------------------------------------------------- Progress Note Details Patient Name: [name] Date of Service: 09/04/2019 9:15 AM Medical Record Number: [id number] Patient Account Number: [id number] Date of Birth/Sex: 07/01/1971 (49 y.o. F) Treating RN: Cornell Barman Primary Care Provider: Alma Friendly Other Clinician: Referring Provider: Alma Friendly Treating Provider/Extender: Tito Dine in  Treatment: 11 Subjective History of Present Illness (HPI) ADMISSION 06/19/2019 Patient is a 49 year old woman who is accompanied by her husband. She has severe diabetic peripheral neuropathy which is left her minimally ambulatory. She spends most of the time in a wheelchair. She also has a history of chronic lower extremity edema history of left leg DVT. She states that on 2 separate occasions roughly a month ago she fell with lacerations on the bilateral anterior tibial areas. She was seen by primary care on 06/04/2019 diagnosed with cellulitis of the left leg put on Bactrim DS. Also noted to have hand wounds with a question of referral to hand surgery. She was also sent to our clinic at the same time. The patient's husband is doing the dressings. He has been using silver alginate that was supplied by home health. He is done a really good job the area on the left leg is actually healed only a small superficial area on the right Somewhere in the in the last 2 weeks she gives a bizarre history of going to wash nail polish off the fingers of both hands. She apparently did not realize that her son had put Clorox in the vinegar jar that they used to clean the countertop. She ended up burning her hands. The patient has a wound on the PIP of the left third finger, PIP of the right second finger and the lateral part of the right third finger Past medical history includes COPD, obstructive sleep apnea on nocturnal O2, type 2 diabetes with peripheral neuropathy, retinopathy, chronic diastolic heart failure coronary artery disease, severe diabetic neuropathy type 2 diabetes apparently diagnosed at age 70 on  insulin, COPD, continued tobacco abuse, left leg deep DVT, hypertension, TIAs, seizure disorders, asthma The patient is actually had arterial studies in February of this year. ABIs bilaterally were 1.02 and TBI's bilaterally at 0.6. Waveforms were triphasic and biphasic. She was not felt to have  significant arterial disease. 12/23; patient readmitted the clinic last week. She has predominantly venous insufficiency wounds on the right medial lower leg. She also had open areas on the fingers of both hands which happened in a very bizarre way. She has almost complete healing on the right medial calf. The area on the left third finger is healed. She still has 2 open wounds on the medial aspect of the PIP of the right second finger and the dorsal aspect of the right PIP of the third finger. Of these 2 wounds the PIP of the third finger wound is painful and deeper 12/30; the areas on her right medial calf are closed. She likely has some degree of chronic venous insufficiency and lymphedema. Also very xerotic skin which I have recommended a moisturizing. I did not bring up the issue of compression stockings The areas we are still looking out are on the PIP of the right third finger and the medial part of the PIP of the right second finger. The second finger area is just about closed. The problem is over the PIP of the right third finger. This does not look infected and she is completing her antibiotics from last week. However this is a deep wound at the base of this there is either tendon or periosteum. She she has some healthy granulation but we are not seeing that close over the bottom part of the wound. We are probably going to need to immobilize the finger somewhat so that she does not continuously pull the tissue apart 07/17/2019; right medial calf remains closed and the right second finger is closed. The sole problem here is an area over the PIP of the third finger. These wounds were initially advertised as burn injuries that happened in a very bizarre way [please see my initial discussion on this]. I gave her doxycycline last week because of some erythema around the wound however she did not tolerate this very well because of nausea. She stopped this within the last day or 2. we have been  using silver collagen 1/13; the only wound we are looking at is the right third finger over the PIP. We are using endoform. X-ray ordered last time showed no underlying bone abnormalities 1/20; right third finger over the PIP. Still debris at the surface we have been using endoform. We are making some improvements in surface area and volume but still requiring debridement. 1/27; right third finger over the PIP. Still debris on the surface of the wound and maceration. We have been using endoform. This wound initially went to the periosteum and it certainly is off that but is certainly stalled over the last 2 or 3 weeks 2/10; right third finger over the PIP. We switch to Iodoflex last time but she says this caused extreme erythema and blistering and they changed back to endoform. This is a wound that originally went to the periosteum. This is improved since her admission but really it is stalled. She missed her appointment last week but she states she has been using endoform. She complains of increasing pain in the finger that is making it difficult for her to sleep at times 2/17; right third finger over the PIP. This is a difficult wound  small punched out area. Culture I did last week showed methicillin sensitive staph aureus however she is allergic to penicillin [widespread rash as a child] she has not had cephalosporins. Multiple drug interactions with quinolones and I was concerned about trimethoprim sulfamethoxazole in somebody with ACE inhibitors and spironolactone. I prescribed doxycycline. This was 2 days ago. She comes in today without the antibiotic saying that it caused mental status changes fatigue and diarrhea the last time she was on this. 2/24; right third finger over the PIP. She is tolerating the doxycycline has another 3 days left. This is for the area on the PIP of the third finger on her right. She arrives in clinic today with a thick raised eschar over the PIP of the left third  finger. I talked to her about this. The exact reason is unclear. She does smoke but denies it could be a burn injury. Her husband thinks this may be trauma pushing her wheelchair but the depth of this makes this somewhat unusual. She does not appear to have any blood flow issues in either arm [name], [name] Objective Constitutional Sitting or standing Blood Pressure is within target range for patient.. Pulse regular and within target range for patient.Marland Kitchen Respirations regular, non- labored and within target range.. Temperature is normal and within the target range for the patient.Marland Kitchen appears in no distress. Vitals Time Taken: 9:23 AM, Height: 64 in, Weight: 286 lbs, BMI: 49.1, Temperature: 98.1 F, Pulse: 83 bpm, Respiratory Rate: 16 breaths/min, Blood Pressure: 136/52 mmHg. General Notes: Wound exam Dorsal PIP of the right third finger. This looked a little more benign today. Still not a viable surface. I did not debride this. She has no tenderness over the joint but she is neuropathic. Slight erythema Dorsal PIP of the left third finger. This is a new wound today thick black eschar removed with a #5 curette necrotic debris to reveal leg punched out wound area. The exact etiology of this is not clear. Radial pulses are palpable on both sides brachial pulses also palpable. I see nothing on her hands that would suggest an embolic phenomenon. Integumentary (Hair, Skin) Wound #5 status is Open. Original cause of wound was Trauma. The wound is located on the Right Hand - 3rd Digit. The wound measures 0.2cm length x 0.2cm width x 0.2cm depth; 0.031cm^2 area and 0.006cm^3 volume. There is Fat Layer (Subcutaneous Tissue) Exposed exposed. There is a medium amount of sanguinous drainage noted. The wound margin is thickened. There is small (1-33%) pink granulation within the wound bed. There is a large (67-100%) amount of necrotic tissue within the wound bed including Adherent Slough. Wound #6  status is Open. Original cause of wound was Gradually Appeared. The wound is located on the Left Hand - 3rd Digit. The wound measures 0.8cm length x 0.8cm width x 0.2cm depth; 0.503cm^2 area and 0.101cm^3 volume. There is no tunneling or undermining noted. There is a none present amount of drainage noted. The wound margin is flat and intact. There is no granulation within the wound bed. There is a large (67-100%) amount of necrotic tissue within the wound bed including Eschar. Assessment Active Problems ICD-10 Unspecified open wound of right hand, subsequent encounter Type 2 diabetes mellitus with other skin ulcer Unspecified open wound of left hand, subsequent encounter Procedures Wound #6 Pre-procedure diagnosis of Wound #6 is an Atypical located on the Left Hand - 3rd Digit . There was a Excisional Skin/Subcutaneous Tissue Debridement with a total area of 0.64 sq cm  performed by Ricard Dillon, MD. With the following instrument(s): Curette to remove Viable and Non-Viable tissue/material. Material removed includes Eschar and Subcutaneous Tissue and after achieving pain control using Lidocaine. No specimens were taken. A time out was conducted at 09:43, prior to the start of the procedure. A Minimum amount of bleeding was controlled with Pressure. The procedure was tolerated well. Post Debridement Measurements: 0.8cm length x 0.8cm width x 0.2cm depth; 0.101cm^3 volume. Character of Wound/Ulcer Post Debridement requires further debridement. Post procedure Diagnosis Wound #6: Same as Pre-Procedure Plan [name], [name] Wound Cleansing: Wound #5 Right Hand - 3rd Digit: Clean wound with Normal Saline. May Shower, gently pat wound dry prior to applying new dressing. Wound #6 Left Hand - 3rd Digit: Clean wound with Normal Saline. May Shower, gently pat wound dry prior to applying new dressing. Anesthetic (add to Medication List): Wound #5 Right Hand - 3rd Digit: Topical  Lidocaine 4% cream applied to wound bed prior to debridement (In Clinic Only). Wound #6 Left Hand - 3rd Digit: Topical Lidocaine 4% cream applied to wound bed prior to debridement (In Clinic Only). Skin Barriers/Peri-Wound Care: Wound #5 Right Hand - 3rd Digit: Other: - Bacitracin Wound #6 Left Hand - 3rd Digit: Other: - Bacitracin Primary Wound Dressing: Wound #5 Right Hand - 3rd Digit: Other: - Endoform moistened with saline Wound #6 Left Hand - 3rd Digit: Other: - Endoform moistened with saline Secondary Dressing: Wound #5 Right Hand - 3rd Digit: Non-adherent pad - Splint secured with Conform and tape Wound #6 Left Hand - 3rd Digit: Non-adherent pad - Splint secured with Conform and tape Dressing Change Frequency: Wound #5 Right Hand - 3rd Digit: Change Dressing Monday, Wednesday, Friday Wound #6 Left Hand - 3rd Digit: Change Dressing Monday, Wednesday, Friday Follow-up Appointments: Wound #5 Right Hand - 3rd Digit: Return Appointment in 1 week. Wound #6 Left Hand - 3rd Digit: Return Appointment in 1 week. Additional Orders / Instructions: Wound #5 Right Hand - 3rd Digit: Other: - Keep finger as straight as possible. Wound #6 Left Hand - 3rd Digit: Other: - Keep finger as straight as possible. Home Health: Wound #5 Right Hand - 3rd Digit: [place] Nurse may visit PRN to address patient s wound care needs. FACE TO FACE ENCOUNTER: MEDICARE and MEDICAID PATIENTS: I certify that this patient is under my care and that I had a face-to-face encounter that meets the physician face-to-face encounter requirements with this patient on this date. The encounter with the patient was in whole or in part for the following MEDICAL CONDITION: (primary reason for [place]) MEDICAL NECESSITY: I certify, that based on my findings, NURSING services are a medically necessary home health service. HOME BOUND STATUS: I certify that my clinical  findings support that this patient is homebound (i.e., Due to illness or injury, pt requires aid of supportive devices such as crutches, cane, wheelchairs, walkers, the use of special transportation or the assistance of another person to leave their place of residence. There is a normal inability to leave the home and doing so requires considerable and taxing effort. Other absences are for medical reasons / religious services and are infrequent or of short duration when for other reasons). If current dressing causes regression in wound condition, may D/C ordered dressing product/s and apply Normal Saline Moist Dressing daily until next [place] / Other MD appointment. [place] of regression in wound condition at [phone]. Please direct  any NON-WOUND related issues/requests for orders to patient's Primary Care Physician Wound #6 Left Hand - 3rd Digit: [place] Nurse may visit PRN to address patient s wound care needs. FACE TO FACE ENCOUNTER: MEDICARE and MEDICAID PATIENTS: I certify that this patient is under my care and that I had a face-to-face encounter that meets the physician face-to-face encounter requirements with this patient on this date. The encounter with the patient was in whole or in part for the following MEDICAL CONDITION: (primary reason for [place]) MEDICAL NECESSITY: I certify, that based on my findings, NURSING services are a medically necessary home health service. HOME BOUND STATUS: I certify that my clinical findings support that this patient is homebound (i.e., Due to illness or injury, pt requires aid of supportive devices such as crutches, cane, wheelchairs, walkers, the use of special transportation or the assistance of another person to leave their place of residence. There is a normal inability to leave the home and doing so requires considerable and taxing effort. Other absences  are for medical reasons / religious services and are infrequent or of short duration when for other reasons). If current dressing causes regression in wound condition, may D/C ordered dressing product/s and apply Normal Saline Moist Dressing daily until next [place] / Other MD appointment. [place] of regression in wound condition at [phone]. Please direct any NON-WOUND related issues/requests for orders to patient's Primary Care Physician [name], [name] Medications-please add to medication list.: Wound #5 Right Hand - 3rd Digit: P.O. Antibiotics - Complete Doxycycline Wound #6 Left Hand - 3rd Digit: P.O. Antibiotics - Complete Doxycycline 1. I am going to use endoform to both wound areas 2. The reason behind the wound on the left third finger is a mystery like the other areas. When she first came into this clinic she said that all of this was a bleach injury today she cannot give me an explanation. This does not look like trauma on a wheelchair as it is to punched out and too much relative depth. I see no evidence of embolic phenomenon etc. 3. She is going to complete her doxycycline will have a look at things next week Electronic Signature(s) Signed: 09/04/2019 5:01:35 PM By: Linton Ham MD Entered By: Linton Ham on 09/04/2019 09:58:04 [name], [name] -------------------------------------------------------------------------------- SuperBill Details Patient Name: [name] Date of Service: 09/04/2019 Medical Record Number: [id number] Patient Account Number: [id number] Date of Birth/Sex: 1970/10/27 (49 y.o. F) Treating RN: Cornell Barman Primary Care Provider: Alma Friendly Other Clinician: Referring Provider: Alma Friendly Treating Provider/Extender: Tito Dine in Treatment: 11 Diagnosis Coding ICD-10 Codes Code Description [id number] Unspecified open wound of right hand, subsequent  encounter E11.622 Type 2 diabetes mellitus with other skin ulcer S61.402D Unspecified open wound of left hand, subsequent encounter Facility Procedures CPT4 Code: [id number] Description: [phone] - WOUND CARE VISIT-LEV 2 EST PT Modifier: Quantity: 1 CPT4 Code: [id number] Description: [id number] - DEB SUBQ TISSUE 20 SQ CM/< Modifier: Quantity: 1 CPT4 Code: Description: ICD-10 Diagnosis Description S61.402D Unspecified open wound of left hand, subsequent encounter Modifier: Quantity: Physician Procedures CPT4 Code: [id number] Description: [id number] - WC PHYS SUBQ TISS 20 SQ CM Modifier: Quantity: 1 CPT4 Code: Description: ICD-10 Diagnosis Description S61.402D Unspecified open wound of left hand, subsequent encounter Modifier: Quantity: Electronic Signature(s) Signed: 09/04/2019 5:01:35 PM By: Linton Ham MD Entered By: Linton Ham on 09/04/2019 09:58:27

## 2019-09-18 ENCOUNTER — Ambulatory Visit: Payer: BC Managed Care – PPO | Admitting: Internal Medicine

## 2019-09-23 ENCOUNTER — Encounter: Payer: Self-pay | Admitting: Radiology

## 2019-09-23 ENCOUNTER — Other Ambulatory Visit: Payer: Self-pay

## 2019-09-23 ENCOUNTER — Emergency Department
Admission: EM | Admit: 2019-09-23 | Discharge: 2019-09-23 | Disposition: A | Discharge status: Home / Self Care | Payer: BC Managed Care – PPO | Attending: Emergency Medicine | Admitting: Emergency Medicine

## 2019-09-23 ENCOUNTER — Emergency Department: Payer: BC Managed Care – PPO

## 2019-09-23 ENCOUNTER — Telehealth: Payer: Self-pay | Admitting: *Deleted

## 2019-09-23 DIAGNOSIS — Z7982 Long term (current) use of aspirin: Secondary | ICD-10-CM | POA: Diagnosis not present

## 2019-09-23 DIAGNOSIS — R519 Headache, unspecified: Secondary | ICD-10-CM | POA: Diagnosis not present

## 2019-09-23 DIAGNOSIS — R079 Chest pain, unspecified: Secondary | ICD-10-CM | POA: Diagnosis not present

## 2019-09-23 DIAGNOSIS — N182 Chronic kidney disease, stage 2 (mild): Secondary | ICD-10-CM | POA: Insufficient documentation

## 2019-09-23 DIAGNOSIS — R202 Paresthesia of skin: Secondary | ICD-10-CM

## 2019-09-23 DIAGNOSIS — I5032 Chronic diastolic (congestive) heart failure: Secondary | ICD-10-CM | POA: Insufficient documentation

## 2019-09-23 DIAGNOSIS — J449 Chronic obstructive pulmonary disease, unspecified: Secondary | ICD-10-CM | POA: Diagnosis not present

## 2019-09-23 DIAGNOSIS — R0789 Other chest pain: Secondary | ICD-10-CM | POA: Insufficient documentation

## 2019-09-23 DIAGNOSIS — I2510 Atherosclerotic heart disease of native coronary artery without angina pectoris: Secondary | ICD-10-CM | POA: Diagnosis not present

## 2019-09-23 DIAGNOSIS — F17210 Nicotine dependence, cigarettes, uncomplicated: Secondary | ICD-10-CM | POA: Insufficient documentation

## 2019-09-23 DIAGNOSIS — I4510 Unspecified right bundle-branch block: Secondary | ICD-10-CM | POA: Diagnosis not present

## 2019-09-23 DIAGNOSIS — E1165 Type 2 diabetes mellitus with hyperglycemia: Secondary | ICD-10-CM | POA: Diagnosis not present

## 2019-09-23 DIAGNOSIS — I130 Hypertensive heart and chronic kidney disease with heart failure and stage 1 through stage 4 chronic kidney disease, or unspecified chronic kidney disease: Secondary | ICD-10-CM | POA: Diagnosis not present

## 2019-09-23 DIAGNOSIS — E1022 Type 1 diabetes mellitus with diabetic chronic kidney disease: Secondary | ICD-10-CM | POA: Insufficient documentation

## 2019-09-23 DIAGNOSIS — Z79899 Other long term (current) drug therapy: Secondary | ICD-10-CM | POA: Insufficient documentation

## 2019-09-23 LAB — CBC
HCT: 40.9 % (ref 36.0–46.0)
Hemoglobin: 13.6 g/dL (ref 12.0–15.0)
MCH: 29.2 pg (ref 26.0–34.0)
MCHC: 33.3 g/dL (ref 30.0–36.0)
MCV: 87.8 fL (ref 80.0–100.0)
Platelets: 203 10*3/uL (ref 150–400)
RBC: 4.66 MIL/uL (ref 3.87–5.11)
RDW: 16.2 % — ABNORMAL HIGH (ref 11.5–15.5)
WBC: 11.3 10*3/uL — ABNORMAL HIGH (ref 4.0–10.5)
nRBC: 0 % (ref 0.0–0.2)

## 2019-09-23 LAB — BASIC METABOLIC PANEL
Anion gap: 12 (ref 5–15)
BUN: 44 mg/dL — ABNORMAL HIGH (ref 6–20)
CO2: 34 mmol/L — ABNORMAL HIGH (ref 22–32)
Calcium: 10 mg/dL (ref 8.9–10.3)
Chloride: 86 mmol/L — ABNORMAL LOW (ref 98–111)
Creatinine, Ser: 1.41 mg/dL — ABNORMAL HIGH (ref 0.44–1.00)
GFR calc Af Amer: 50 mL/min — ABNORMAL LOW (ref >60)
GFR calc non Af Amer: 43 mL/min — ABNORMAL LOW (ref >60)
Glucose, Bld: 487 mg/dL — ABNORMAL HIGH (ref 70–99)
Potassium: 3.7 mmol/L (ref 3.5–5.1)
Sodium: 132 mmol/L — ABNORMAL LOW (ref 135–145)

## 2019-09-23 LAB — HEPATIC FUNCTION PANEL
ALT: 44 U/L (ref 0–44)
AST: 40 U/L (ref 15–41)
Albumin: 4 g/dL (ref 3.5–5.0)
Alkaline Phosphatase: 125 U/L (ref 38–126)
Bilirubin, Direct: 0.2 mg/dL (ref 0.0–0.2)
Indirect Bilirubin: 0.9 mg/dL (ref 0.3–0.9)
Total Bilirubin: 1.1 mg/dL (ref 0.3–1.2)
Total Protein: 8.8 g/dL — ABNORMAL HIGH (ref 6.5–8.1)

## 2019-09-23 LAB — URINE DRUG SCREEN, QUALITATIVE (ARMC ONLY)
Amphetamines, Ur Screen: NOT DETECTED
Barbiturates, Ur Screen: NOT DETECTED
Benzodiazepine, Ur Scrn: NOT DETECTED
Cannabinoid 50 Ng, Ur ~~LOC~~: NOT DETECTED
Cocaine Metabolite,Ur ~~LOC~~: NOT DETECTED
MDMA (Ecstasy)Ur Screen: NOT DETECTED
Methadone Scn, Ur: NOT DETECTED
Opiate, Ur Screen: NOT DETECTED
Phencyclidine (PCP) Ur S: NOT DETECTED
Tricyclic, Ur Screen: NOT DETECTED

## 2019-09-23 LAB — LIPASE, BLOOD: Lipase: 40 U/L (ref 11–51)

## 2019-09-23 LAB — TROPONIN I (HIGH SENSITIVITY)
Troponin I (High Sensitivity): 10 ng/L (ref <18)
Troponin I (High Sensitivity): 10 ng/L (ref <18)

## 2019-09-23 LAB — ETHANOL: Alcohol, Ethyl (B): <10 mg/dL (ref <10)

## 2019-09-23 MED ORDER — IOHEXOL 350 MG/ML SOLN
75.0000 mL | Freq: Once | INTRAVENOUS | Status: AC | PRN
  Administered 2019-09-23: 75 mL via INTRAVENOUS

## 2019-09-23 MED ORDER — SODIUM CHLORIDE 0.9 % IV BOLUS
1000.0000 mL | Freq: Once | INTRAVENOUS | Status: AC
  Administered 2019-09-23: 18:00:00 1000 mL via INTRAVENOUS

## 2019-09-23 MED ORDER — ONDANSETRON HCL 4 MG/2ML IJ SOLN
4.0000 mg | Freq: Once | INTRAMUSCULAR | Status: AC
  Administered 2019-09-23: 4 mg via INTRAVENOUS
  Filled 2019-09-23: qty 2

## 2019-09-23 MED ORDER — FENTANYL CITRATE (PF) 100 MCG/2ML IJ SOLN
50.0000 ug | Freq: Once | INTRAMUSCULAR | Status: AC
  Administered 2019-09-23: 50 ug via INTRAVENOUS
  Filled 2019-09-23: qty 2

## 2019-09-23 NOTE — Telephone Encounter (Signed)
Noted and agree. 

## 2019-09-23 NOTE — ED Provider Notes (Addendum)
[facility] Emergency Department Provider Note  ____________________________________________   First MD Initiated Contact with Patient 09/23/19 1624     (approximate)  I have reviewed the triage vital signs and the nursing notes.   HISTORY  Chief Complaint Chest Pain    HPI [name] is a 49 y.o. female with anxiety, aortic valve calcification, coronary disease with catheterization done in 2015 that showed mild disease in the LAD and mild to moderate disease in the RCA with normal EF, CKD, COPD on 4 L of oxygen at nighttime, prior stroke, diabetes, blood clot who comes in with chest pain.  Patient states that she felt normal this morning but then around noon she had chest pain in the center of her chest that radiated down the left arm was associated with some left arm numbness and weakness.  This is been constant, not better with aspirin, nothing makes it worse.  Denies this happening previously.  It appears back in September 2020 patient had a code stroke but symptoms resolved and TPA was not given.  If thought to be functional versus hyperglycemic.  Patient had another episode in August 2020 that again resolved and had a normal CT head CTA was not a candidate for MRI therefore was discharged.  She also had an admission in March 2020 for left-sided weakness and slurred speech that she had stable imaging after 24 hours and it was thought that this could be polypharmacy versus conversion disorder.    Past Medical History:  Diagnosis Date  . Anxiety    takes Prozac daily  . Anxiety   . Aortic valve calcification   . Asthma    Advair and Spirva daily  . Asthma   . Bipolar disorder ([place])   . CAD (coronary artery disease)    a. LHC 11/2013 done for CP/fluid retention: mild disease in prox LAD, mild-mod disease in mRCA, EF 60% with normal LVEDP. b. Normal nuc 03/2016.  Marland Kitchen CHF (congestive heart failure) ([place])   . Chronic diastolic CHF (congestive heart  failure) ([place])   . CKD (chronic kidney disease), stage II   . COPD (chronic obstructive pulmonary disease) (HCC)    a. nocturnal O2.  Marland Kitchen COPD (chronic obstructive pulmonary disease) ([place])   . Coronary artery disease   . Decreased urine stream   . Diabetes mellitus   . Diabetes mellitus without complication ([place])   . Dyspnea   . Family history of adverse reaction to anesthesia    mom gets nauseated  . GERD (gastroesophageal reflux disease)    takes Pepcid daily  . History of blood clots    left leg 3-56yrs ago  . Hyperlipidemia   . Hypertension   . Hypertriglyceridemia   . Inguinal hernia, left 01/2015  . Muscle spasm   . Open wound of genital labia   . Peripheral neuropathy   . RBBB   . Seizures ([place])   . Sepsis ([place]) 01/19/2018  . Sinus tachycardia    a. persistent since 2009.  Marland Kitchen Smokers' cough ([place])   . Stroke [facility]) 1989   left sided weakness  . TIA (transient ischemic attack)   . Tobacco abuse   . Vulvar abscess 01/23/2018    Patient Active Problem List   Diagnosis Date Noted  . Postmenopausal bleeding 08/30/2019  . Carotid stenosis, asymptomatic, right 08/16/2019  . Nausea and vomiting 07/15/2019  . Cellulitis of left lower extremity 06/04/2019  . Multiple wounds of skin 06/04/2019  . Chronic heart failure with  preserved ejection fraction ([place]) 11/16/2018  . Left-sided weakness 09/13/2018  . Weakness of left lower extremity 09/05/2018  . Recurrent falls 09/05/2018  . PAD (peripheral artery disease) ([place]) 08/27/2018  . Chronic congestive heart failure ([place]) 08/02/2018  . Vaginal itching 06/15/2018  . Foot pain, bilateral 05/22/2018  . Chronic upper extremity pain (Secondary Area of Pain) (Bilateral) 04/23/2018  . Diabetic polyneuropathy associated with type 1 diabetes mellitus ([place]) 04/23/2018  . Long term prescription benzodiazepine use 04/23/2018  . Neurogenic pain 04/23/2018  . Vitamin D insufficiency 01/29/2018  . Elevated C-reactive protein (CRP) 01/29/2018   . Elevated sedimentation rate 01/29/2018  . Pressure injury of skin 01/29/2018  . Poorly controlled type 2 diabetes mellitus ([place]) 01/23/2018  . DNR (do not resuscitate) 01/23/2018  . IBS (irritable bowel syndrome) 01/19/2018  . Elevated serum hCG 01/19/2018  . Chronic lower extremity pain (Primary Area of Pain) (Bilateral) 01/08/2018  . Chronic low back pain [facility] Area of Pain) (Bilateral) w/ sciatica (Bilateral) 01/08/2018  . Chronic pain syndrome 01/08/2018  . Pharmacologic therapy 01/08/2018  . Disorder of skeletal system 01/08/2018  . Problems influencing health status 01/08/2018  . Long term current use of opiate analgesic 01/08/2018  . Restless leg syndrome 08/02/2017  . Nocturnal hypoxemia 03/29/2017  . Vision disturbance 02/28/2017  . CKD (chronic kidney disease), stage II 02/28/2017  . Visual loss, bilateral   . Chronic respiratory failure with hypoxia (HCC)/ nocturnal 02 dep  10/28/2016  . Upper airway cough syndrome 08/22/2016  . Cigarette smoker 06/15/2016  . Simple chronic bronchitis ([place])   . Coronary artery disease involving native heart without angina pectoris 05/16/2016  . Acute on chronic diastolic (congestive) heart failure ([place]) 11/04/2015  . Orthopnea 11/03/2015  . Bilateral leg edema   . Diabetes ([place])   . COPD exacerbation ([place]) 10/15/2015  . Fracture of rib, closed 10/15/2015  . Venous stasis of both lower extremities 03/29/2015  . Preventative health care 02/04/2015  . Essential hypertension 01/02/2015  . Inguinal hernia 11/27/2014  . Allergic rhinitis with postnasal drip 01/21/2014  . Aortic valve disorders 04/18/2013  . Sleep apnea 05/22/2012  . Seizure disorder ([place]) 10/09/2011  . Bipolar disorder ([place]) 10/09/2011  . TIA (transient ischemic attack) 06/22/2011  . Constipation 06/15/2011  . HYPERTRIGLYCERIDEMIA 07/17/2007  . Situational anxiety 05/30/2007  . COPD GOLD 0  05/30/2007  . Morbid (severe) obesity due to excess calories ([place])  04/23/2007  . Tobacco abuse 09/07/2006  . Asthma, persistent 09/07/2006    Past Surgical History:  Procedure Laterality Date  . HERNIA REPAIR    . INCISION AND DRAINAGE ABSCESS N/A 01/26/2018   Procedure: INCISION AND DEBRIDEMENT OF VULVAR NECROTIZING SOFT TISSUE INFECTION;  Surgeon: Greer Pickerel, MD;  Location: [place];  Service: General;  Laterality: N/A;  . INCISION AND DRAINAGE PERIRECTAL ABSCESS N/A 01/22/2018   Procedure: IRRIGATION AND DEBRIDEMENT LABIAL/VULVAR AREA;  Surgeon: Coralie Keens, MD;  Location: [place];  Service: General;  Laterality: N/A;  . INCISION AND DRAINAGE PERIRECTAL ABSCESS N/A 01/29/2018   Procedure: IRRIGATION AND DEBRIDEMENT VULVA;  Surgeon: Excell Seltzer, MD;  Location: [place];  Service: General;  Laterality: N/A;  . INGUINAL HERNIA REPAIR Left 04/08/2015   Procedure: OPEN LEFT INGUINAL HERNIA REPAIR WITH MESH;  Surgeon: Ralene Ok, MD;  Location: [place];  Service: General;  Laterality: Left;  . INSERTION OF MESH Left 04/08/2015   Procedure: INSERTION OF MESH;  Surgeon: Ralene Ok, MD;  Location: [place];  Service: General;  Laterality: Left;  .  LAPAROSCOPY     Endometriosis  . LEFT HEART CATHETERIZATION WITH CORONARY ANGIOGRAM N/A 12/05/2013   Procedure: LEFT HEART CATHETERIZATION WITH CORONARY ANGIOGRAM;  Surgeon: Jettie Booze, MD;  Location: [facility] CATH LAB;  Service: Cardiovascular;  Laterality: N/A;  . right kidney drained    . TEE WITHOUT CARDIOVERSION N/A 11/28/2013   Procedure: TRANSESOPHAGEAL ECHOCARDIOGRAM (TEE);  Surgeon: Thayer Headings, MD;  Location: [place];  Service: Cardiovascular;  Laterality: N/A;    Prior to Admission medications   Medication Sig Start Date End Date Taking? Authorizing Provider  acetaminophen (TYLENOL) 500 MG tablet Take 500 mg by mouth every 4 (four) hours as needed for moderate pain.     [provider]  ALPRAZolam Duanne Moron) 0.5 MG tablet Take 1 tablet (0.5 mg total) by mouth 2 (two) times daily as  needed for anxiety. Patient taking differently: Take 1 mg by mouth daily.  09/14/18   Geradine Girt, DO  aspirin EC 81 MG tablet Take 81 mg by mouth every morning.    [provider]  budesonide (PULMICORT) 0.5 MG/2ML nebulizer solution Take 0.5 mg by nebulization 2 (two) times daily.    [provider]  budesonide-formoterol (SYMBICORT) 80-4.5 MCG/ACT inhaler INHALE 2 PUFFS BY MOUTH EVERY 12 HOURS TO PREVENT COUGH OR WHEEZING *RINSE MOUTH AFTER EACH USE* Patient not taking: Reported on 08/16/2019 06/12/19   Flora Lipps, MD  busPIRone (BUSPAR) 30 MG tablet Take 30 mg by mouth 2 (two) times daily.    [provider]  doxycycline (MONODOX) 50 MG capsule Take 50 mg by mouth 2 (two) times daily. 07/03/19   [provider]  famotidine (PEPCID) 20 MG tablet TAKE 1 TABLET (20 MG TOTAL) BY MOUTH 2 (TWO) TIMES DAILY. FOR HEARTBURN. 06/05/19   Pleas Koch, NP  FLUoxetine (PROZAC) 40 MG capsule Take 40 mg by mouth at bedtime.  07/25/18   [provider]  glucose blood (ONETOUCH VERIO) test strip 1 each by Other route 3 (three) times daily. E11.9; E10.42 09/09/19   Renato Shin, MD  Insulin Pen Needle 32G X 4 MM MISC Used to give insulin injections twice daily. 08/23/17   Renato Shin, MD  insulin regular human CONCENTRATED (HUMULIN R) 500 UNIT/ML injection Inject 0.8 mLs (400 Units total) into the skin 2 (two) times daily with a meal. DOSAGE IS CORRECT. PLEASE FILL AS PRESCRIBED 08/19/19   Renato Shin, MD  Insulin Syringe-Needle U-100 (INSULIN SYRINGE 1CC/30GX1/2") 30G X 1/2" 1 ML MISC 1 each by Does not apply route 2 (two) times daily. E11.9 08/19/19   Renato Shin, MD  ipratropium-albuterol (DUONEB) 0.5-2.5 (3) MG/3ML SOLN Take 3 mLs by nebulization every 4 (four) hours as needed (wheezing/shortness of breath). 08/02/17   Pleas Koch, NP  OneTouch Delica Lancets [id number] MISC 1 each by Does not apply route 3 (three) times daily. Use to monitor glucose levels  three times daily; E11.9; E10.42 09/26/18   Renato Shin, MD  OXYGEN Inhale 4 L into the lungs at bedtime.     [provider]  potassium chloride 20 MEQ TBCR Take 20 mEq by mouth daily. 07/09/19   Patwardhan, Reynold Bowen, MD  pregabalin (LYRICA) 300 MG capsule Take 1 capsule (300 mg total) by mouth 2 (two) times daily. as needed for neuropathy. 06/05/19   Pleas Koch, NP  rOPINIRole (REQUIP) 1 MG tablet TAKE 1 TABLET BY MOUTH EVERY NIGHT AT BEDTIME FOR RESTLESS LEGS. 06/28/19   Pleas Koch, NP  rosuvastatin ([place])  10 MG tablet Take 1 tablet (10 mg total) by mouth daily. 08/16/19 11/14/19  Patwardhan, Reynold Bowen, MD  Semaglutide,0.25 or 0.5MG /DOS, (OZEMPIC, 0.25 OR 0.5 MG/DOSE,) 2 MG/1.5ML SOPN Inject 0.25 mg into the skin once a week. 08/12/19   Renato Shin, MD  spironolactone (ALDACTONE) 50 MG tablet TAKE 1 TABLET BY MOUTH ONCE A DAY 06/18/19   Patwardhan, Manish J, MD  tobramycin (TOBREX) 0.3 % ophthalmic solution Place 1 drop into both eyes daily.  10/26/18   [provider]  torsemide (DEMADEX) 20 MG tablet TAKE 1 TABLET BY MOUTH 4 TIMES DAILY Patient taking differently: 40 mg 2 (two) times daily.  07/10/19   Patwardhan, Reynold Bowen, MD  traZODone (DESYREL) 100 MG tablet Take 100 mg by mouth daily.  11/08/18   [provider]  ULTICARE MINI PEN NEEDLES 31G X 6 MM MISC USE AS DIRECTED THREE TIMES DAILY 07/25/18   Renato Shin, MD    Allergies Adhesive [tape], Metoprolol, Montelukast, Morphine sulfate, Penicillins, Prednisone, Diltiazem, and Gabapentin  Family History  Problem Relation Age of Onset  . Venous thrombosis Brother   . Other Brother        BRAIN TUMOR  . Asthma Father   . Diabetes Father   . Coronary artery disease Mother   . Hypertension Mother   . Diabetes Mother   . Asthma Sister   . Diabetes type II Brother     Social History Social History   Tobacco Use  . Smoking status: Current Some Day Smoker    Packs/day: 0.50    Years: 36.00      Pack years: 18.00    Types: Cigarettes    Last attempt to quit: 01/18/2018    Years since quitting: 1.6  . Smokeless tobacco: Never Used  Substance Use Topics  . Alcohol use: No  . Drug use: No      Review of Systems Constitutional: No fever/chills Eyes: No visual changes. ENT: No sore throat. Cardiovascular: Positive chest pain Respiratory: Denies shortness of breath. Gastrointestinal: No abdominal pain.  No nausea, no vomiting.  No diarrhea.  No constipation. Genitourinary: Negative for dysuria. Musculoskeletal: Negative for back pain. Skin: Negative for rash. Neurological: Positive left arm weakness and left arm numbness All other ROS negative ____________________________________________   PHYSICAL EXAM:  VITAL SIGNS: Blood pressure (!) 154/67, pulse 94, temperature 98.2 F (36.8 C), temperature source Oral, resp. rate 17, height 5\' 6"  (1.676 m), weight 128.4 kg, last menstrual period 11/11/2011, SpO2 98 %.  Constitutional: Alert and oriented. Well appearing and in no acute distress. Eyes: Conjunctivae are normal. EOMI. Head: Atraumatic. Nose: No congestion/rhinnorhea. Mouth/Throat: Mucous membranes are moist.   Neck: No stridor. Trachea Midline. FROM Cardiovascular: Normal rate, regular rhythm. Grossly normal heart sounds.  Good peripheral circulation. Respiratory: Normal respiratory effort.  No retractions. Lungs CTAB. Gastrointestinal: Soft and nontender. No distention. No abdominal bruits.  Musculoskeletal: No lower extremity tenderness nor edema.  No joint effusions. Neurologic: Cranial nerves II through XII are intact.  However patient attempts to lift up her left arm and then suddenly will drop it after a few seconds.  With coaching patient is able to keep it up but will slightly drift.  Patient's left leg is slightly weaker as well although she states this is at baseline from a prior knee injury.  Patient states that she cannot feel anything on her left arm.   When I have her close her eyes and I am touching the right arm she states though  that she is feeling the left side.  When I am feeling both sides she states that she does not feel anything.  I question patient stating that she said that she could not feel the left side but now she is not able to feel her right side either.  Patient states that she does not know that she states she has a history of diabetic retinopathy but thinks that it is only the left side that is been affected that is new.  Peripheral vision appears intact. Skin:  Skin is warm, dry and intact. No rash noted. Psychiatric: Mood and affect are normal. Speech and behavior are normal. GU: Deferred   ____________________________________________   LABS (all labs ordered are listed, but only abnormal results are displayed)  Labs Reviewed  BASIC METABOLIC PANEL - Abnormal; Notable for the following components:      Result Value   Sodium 132 (*)    Chloride 86 (*)    CO2 34 (*)    Glucose, Bld 487 (*)    BUN 44 (*)    Creatinine, Ser 1.41 (*)    GFR calc non Af Amer 43 (*)    GFR calc Af Amer 50 (*)    All other components within normal limits  CBC - Abnormal; Notable for the following components:   WBC 11.3 (*)    RDW 16.2 (*)    All other components within normal limits  HEPATIC FUNCTION PANEL - Abnormal; Notable for the following components:   Total Protein 8.8 (*)    All other components within normal limits  LIPASE, BLOOD  URINE DRUG SCREEN, QUALITATIVE ([facility] ONLY)  ETHANOL  POC URINE PREG, ED  TROPONIN I (HIGH SENSITIVITY)  TROPONIN I (HIGH SENSITIVITY)   ____________________________________________   ED ECG REPORT I, Vanessa [place], the attending physician, personally viewed and interpreted this ECG.  EKG is sinus rate of 87, no ST elevation, no T wave inversions, QTC slightly long at 501, right bundle branch block ____________________________________________  RADIOLOGY I, Vanessa [place], personally viewed  and evaluated these images (plain radiographs) as part of my medical decision making, as well as reviewing the written report by the radiologist.  ED MD interpretation: Chest x-ray reviewed and negative  Official radiology report(s): DG Chest 2 View  Result Date: 09/23/2019 CLINICAL DATA:  Chest pain EXAM: CHEST - 2 VIEW COMPARISON:  03/21/2019 FINDINGS: No pleural effusion. Lobulated contour of the right diaphragm. No consolidation or pneumothorax. Stable cardiomediastinal silhouette. IMPRESSION: No active cardiopulmonary disease. Electronically Signed   By: Donavan Foil M.D.   On: 09/23/2019 17:16   CT Head Wo Contrast  Result Date: 09/23/2019 CLINICAL DATA:  Headache chest pain hyperglycemia EXAM: CT HEAD WITHOUT CONTRAST TECHNIQUE: Contiguous axial images were obtained from the base of the skull through the vertex without intravenous contrast. COMPARISON:  CT 03/21/2019 FINDINGS: Brain: No acute territorial infarction, hemorrhage, or intracranial mass. The ventricles are nonenlarged. Vascular: No hyperdense vessels. Scattered calcifications at the carotid siphon Skull: Normal. Negative for fracture or focal lesion. Sinuses/Orbits: No acute finding. Other: Metallic BB in the left anterior scalp IMPRESSION: Negative non contrasted CT appearance of the brain Electronically Signed   By: Donavan Foil M.D.   On: 09/23/2019 18:31   CT ANGIO CHEST AORTA W/CM & OR WO/CM  Result Date: 09/23/2019 CLINICAL DATA:  Chest pain hyperglycemia hypertension EXAM: CT ANGIOGRAPHY CHEST WITH CONTRAST TECHNIQUE: Multidetector CT imaging of the chest was performed using the standard protocol during bolus administration of  intravenous contrast. Multiplanar CT image reconstructions and MIPs were obtained to evaluate the vascular anatomy. CONTRAST:  10mL OMNIPAQUE IOHEXOL 350 MG/ML SOLN COMPARISON:  Chest x-ray 09/23/2019 01/06/2006 FINDINGS: Cardiovascular: Non contrasted images of the chest demonstrate no acute  intramural hematoma. Aorta is nonaneurysmal. No dissection is seen. Coronary vascular calcifications. Cardiac size within normal limits. Trace pericardial effusion or thickening. Mediastinum/Nodes: Midline trachea. Subcentimeter thyroid nodules, no further workup recommended based on lesion size and patient age. No suspicious adenopathy. Esophagus within normal limits. Lungs/Pleura: No consolidation, pleural effusion or pneumothorax. Mild apical emphysema. Scattered pulmonary nodules, with the largest visualized in the subpleural right lower lobe measuring up to 9 mm in size, series 6, image number 98 though stable as compared with 2007 chest CT. Upper Abdomen: No acute abnormality. Musculoskeletal: No chest wall abnormality. No acute or significant osseous findings. Incidental note is made of a 2.3 cm mass in the lower right breast. Review of the MIP images confirms the above findings. IMPRESSION: 1. Negative for acute aortic dissection or aneurysm. 2. 2.3 cm mass within the lower right breast. Recommend correlation with outpatient mammography and ultrasound 3. Mild apical emphysema 4. Scattered pulmonary nodules, stable since 2007 and therefore felt benign Aortic Atherosclerosis (ICD10-I70.0) and Emphysema (ICD10-J43.9). Electronically Signed   By: Donavan Foil M.D.   On: 09/23/2019 18:39    ____________________________________________   PROCEDURES  Procedure(s) performed (including Critical Care):  Procedures   ____________________________________________   INITIAL IMPRESSION / ASSESSMENT AND PLAN / ED COURSE   Loisann Carillo was evaluated in Emergency Department on 09/23/2019 for the symptoms described in the history of present illness. She was evaluated in the context of the global COVID-19 pandemic, which necessitated consideration that the patient might be at risk for infection with the SARS-CoV-2 virus that causes COVID-19. Institutional protocols and algorithms that pertain to the  evaluation of patients at risk for COVID-19 are in a state of rapid change based on information released by regulatory bodies including the CDC and federal and state organizations. These policies and algorithms were followed during the patient's care in the ED.    Most Likely DDx:  -MSK (atypical chest pain) but will get cardiac markers to evaluate for ACS given risk factors/age.  Patient's neuro exam is slightly confusing given patient stating that she has numbness and no feeling her left arm but when I am feeling her right arm she states that she also cannot feel it.  When I am feeling both arms she states im not touching anything.  Reviewed patient's prior records and she has some history with concern for her neuro symptoms being secondary to her hyperglycemia versus functional in nature.  Patient has not a stroke code candidate be given that she is 4-1/2 hours and therefore outside the window for TPA.  Also want to make sure patient's not having dissection before giving blood thinner.  Patient is already taking a aspirin.  At this time I think proceed with a CT head and CT chest to rule out dissection..  Will treat patient's pain with IV pain medications and reassess her neuro status and pain afterwards.    DDx that was also considered d/t potential to cause harm, but was found less likely based on history and physical (as detailed above): -PNA (no fevers, cough but CXR to evaluate) -PNX (reassured with equal b/l breath sounds, CXR to evaluate) -Symptomatic anemia (will get H&H) -Pulmonary embolism as no sob at rest, not pleuritic in nature, no hypoxia, patient does have  a remote history of DVT but it was in the setting of multiple leg surgeries. -Pericarditis no rub on exam, EKG changes or hx to suggest dx -Tamponade (no notable SOB, tachycardic, hypotensive) -Esophageal rupture (no h/o diffuse vomitting/no crepitus)  Patient CT scans are reassuring.  Initial troponin was reassuring but will get  repeat.  Chest x-ray showed no evidence of edema so I will give some fluids for her high sugars although there is no evidence of DKA.  Patient states that her pain is much better.  My initial walk to the room patient was satting 89% patient was sleeping.  Patient saturation recovered with waking.  Patient states that she is on 4 L of oxygen at nighttime and so this is baseline for her and she is not worried about it.  She denies feeling short of breath.  We discussed patient's symptoms and they are improving both the numbness and chest pain.  We discussed admission for repeat CT head the next day given patient is unable to get MRI.  We also discussed admission for chest pain.  However patient states that she is feeling better and that she would like to go home.  She capacity make this decision and this is reasonable.  Patient also now reporting that she has been under a lot of stress recently because she had a friend die recently and that she had to go to the virtual funeral yesterday.  Patient states that she would just like to be able to go home tonight as long as her heart looked okay.  7:51 PM reevaluated patient continues to feel well.  Second troponin negative.  Patient would still like to go home.  Patient denies any symptoms at this time  I discussed the provisional nature of ED diagnosis, the treatment so far, the ongoing plan of care, follow up appointments and return precautions with the patient and any family or support people present. They expressed understanding and agreed with the plan, discharged home.           ____________________________________________   FINAL CLINICAL IMPRESSION(S) / ED DIAGNOSES   Final diagnoses:  Chest pain, unspecified type  Ting[place]     MEDICATIONS GIVEN DURING THIS VISIT:  Medications  sodium chloride 0.9 % bolus 1,000 mL (0 mLs Intravenous Stopped 09/23/19 1846)  fentaNYL (SUBLIMAZE) injection 50 mcg (50 mcg Intravenous Given 09/23/19 1738)    ondansetron (ZOFRAN) injection 4 mg (4 mg Intravenous Given 09/23/19 1738)  iohexol (OMNIPAQUE) 350 MG/ML injection 75 mL (75 mLs Intravenous Contrast Given 09/23/19 1801)     ED Discharge Orders    None       Note:  This document was prepared using Dragon voice recognition software and may include unintentional dictation errors.   Vanessa [place], MD 09/23/19 1950    Vanessa [place], MD 09/23/19 [id number]

## 2019-09-23 NOTE — Telephone Encounter (Signed)
Patient called stating that she started with chest pain about an hour ago, Patient stated her heart is hurting and wants to come in to have an EKG done to make sure that she is not having a heart attack or a stroke. . Patient stated that she took two 325 mg aspirin. Patient stated that she is having some chest tightness. Patient stated that she is having some left arm pain with some numbness and tingling. Patient stated that she does not want to go to the hospital, but wants to know if she should have EMS come out and do an EKG? Patient was advised to call 911 and have EMS come out now. Patient stated that she will get dressed and then call them. Advised patient that she should call them now and she can get dressed while they are on the way. Offered to call 911 for the patient which she declined stating that she will call them as soon as we hang up.

## 2019-09-23 NOTE — ED Triage Notes (Signed)
Pt arrived via [facility] from home with chest pain and hyperglycemia, cbg 597. 140/100, 100% RA, and 98.1. Pt took ASA before ems arrival.

## 2019-09-23 NOTE — Discharge Instructions (Addendum)
Work-up was reassuring with negative cardiac markers.  Your symptoms were resolving.  We discussed admission versus going home and you prefer to go home which I think was reasonable.  You should follow-up outpatient for the CT scan findings below.  Return to the ER for any other concerns.  1. Negative for acute aortic dissection or aneurysm.  2. 2.3 cm mass within the lower right breast. Recommend correlation  with outpatient mammography and ultrasound  3. Mild apical emphysema  4. Scattered pulmonary nodules, stable since 2007 and therefore felt  benign

## 2019-09-24 ENCOUNTER — Other Ambulatory Visit: Payer: Self-pay | Admitting: Primary Care

## 2019-09-24 DIAGNOSIS — M792 Neuralgia and neuritis, unspecified: Secondary | ICD-10-CM

## 2019-09-24 DIAGNOSIS — E1042 Type 1 diabetes mellitus with diabetic polyneuropathy: Secondary | ICD-10-CM

## 2019-09-24 DIAGNOSIS — G629 Polyneuropathy, unspecified: Secondary | ICD-10-CM

## 2019-09-24 DIAGNOSIS — G2581 Restless legs syndrome: Secondary | ICD-10-CM

## 2019-09-24 NOTE — Telephone Encounter (Signed)
Refills sent to pharmacy. 

## 2019-09-24 NOTE — Telephone Encounter (Signed)
Last prescribed on 06/05/2019 and 06/28/2019 . Last appointment on 07/26/2019 for acute with Dr Diona Browner.Next future appointment for ED follow up on Friday 09/27/2019

## 2019-09-25 ENCOUNTER — Ambulatory Visit: Payer: BC Managed Care – PPO | Admitting: Internal Medicine

## 2019-09-27 ENCOUNTER — Ambulatory Visit: Payer: BC Managed Care – PPO | Admitting: Primary Care

## 2019-09-27 ENCOUNTER — Telehealth: Payer: Self-pay | Admitting: Primary Care

## 2019-09-27 NOTE — Telephone Encounter (Signed)
Patient called today to reschedule 8:00 appointment She is requesting a call back from you today if possible

## 2019-10-03 ENCOUNTER — Ambulatory Visit: Payer: BC Managed Care – PPO | Admitting: Primary Care

## 2019-10-03 ENCOUNTER — Other Ambulatory Visit: Payer: Self-pay | Admitting: Cardiology

## 2019-10-07 ENCOUNTER — Ambulatory Visit (INDEPENDENT_AMBULATORY_CARE_PROVIDER_SITE_OTHER): Payer: BC Managed Care – PPO | Admitting: Primary Care

## 2019-10-07 ENCOUNTER — Encounter: Payer: Self-pay | Admitting: Primary Care

## 2019-10-07 DIAGNOSIS — I2510 Atherosclerotic heart disease of native coronary artery without angina pectoris: Secondary | ICD-10-CM

## 2019-10-07 DIAGNOSIS — N63 Unspecified lump in unspecified breast: Secondary | ICD-10-CM | POA: Insufficient documentation

## 2019-10-07 DIAGNOSIS — I509 Heart failure, unspecified: Secondary | ICD-10-CM

## 2019-10-07 DIAGNOSIS — E1165 Type 2 diabetes mellitus with hyperglycemia: Secondary | ICD-10-CM

## 2019-10-07 DIAGNOSIS — N182 Chronic kidney disease, stage 2 (mild): Secondary | ICD-10-CM | POA: Diagnosis not present

## 2019-10-07 DIAGNOSIS — M792 Neuralgia and neuritis, unspecified: Secondary | ICD-10-CM

## 2019-10-07 NOTE — Patient Instructions (Signed)
You will be contacted regarding your referral to the kidney doctor.  Please let us know if you have not been contacted within two weeks.   Follow up with your cardiologist as discussed.  You will be contacted regarding your mammogram.  Please let us know if you have not been contacted within two weeks.   It was a pleasure to see you today!

## 2019-10-07 NOTE — Progress Notes (Signed)
Subjective:    Patient ID: [name], female    DOB: [birth year], 49 y.o.   MRN: [id number]  HPI     [name] - 49 y.o. female  MRN [id number]  Date of Birth: [birth year]  PCP: Nancy Koch, NP  This service was provided via telemedicine. Phone Visit performed on 10/07/2019    Rationale for phone visit along with limitations reviewed. Patient consented to telephone encounter.    Location of patient: Home Location of provider: Office at [employer] @ [facility] Name of referring provider: N/A   Names of persons and role in encounter: Provider: Pleas Koch, NP  Patient: [name]  Other: N/A   Time on call: 22 min - 03 sec   Subjective: No chief complaint on file.    HPI:  [name] is a 49 year old female with a significant medical history including COPD, chronic respiratory failure, CAD, uncontrolled diabetes, TIA, CHF, diabetic polyneuropathy who presents today for ED follow up.  She presented to [facility] ED on 09/23/19 with complaints of substernal chest pain with radiation down left upper extremity with numbness and weakness.   During her stay in the ED she underwent evaluation for ACS and neurological causes for symptoms. She under went CXR which was negative, CTA which was negative. Serial troponin levels negative. Incidental finding of 2.3 cm mass to right lower breast. Admission was recommended for continued imaging including CT head and monitoring of chest pain but patient declined and wanted to go home. She was discharged home later that evening after second negative Troponin.  Since her discharge home she's feeling better. She denies chest pain with radiation down left arm, denies left upper extremity numbness. She continues to feel fatigued. She is weighing herself daily, no abrupt changes in weight. She is visited by her palliative care nurse every other week.   She is working to get established with [facility] due to CKD, referred by her  cardiology office but [facility] has not heard from them. She has an appointment with pulmonology for tomorrow, compliant to supplemental oxygen when sleeping, 4 liters. Also not compliant to CPAP machine. Follows with cardiology, [name], she has contacted their office regarding her recent ED visit, she's not heard anything back. Glucose levels are running in 400's in the morning, and high 200's in the afternoon, compliant to 400 units twice daily. Following with [name] through endocrinology. She's never had a mammogram.    Objective/Observations:  Alert and oriented. No distress. Speaking in complete sentences.   LMP 11/11/2011    Respiratory status: speaks in complete sentences without evident shortness of breath.   Assessment/Plan:  See problem based charting.  No problem-specific Assessment & Plan notes found for this encounter.   I discussed the assessment and treatment plan with the patient. The patient was provided an opportunity to ask questions and all were answered. The patient agreed with the plan and demonstrated an understanding of the instructions.  Lab Orders  No laboratory test(s) ordered today    No orders of the defined types were placed in this encounter.   The patient was advised to call back or seek an in-person evaluation if the symptoms worsen or if the condition fails to improve as anticipated.  Nancy Koch, NP    Review of Systems  Constitutional: Negative for fever.  Respiratory: Positive for cough. Negative for shortness of breath.        Denies increased SOB  Cardiovascular: Negative  for chest pain.  Neurological: Negative for numbness.        Past Medical History:  Diagnosis Date  . Anxiety    takes Prozac daily  . Anxiety   . Aortic valve calcification   . Asthma    Advair and Spirva daily  . Asthma   . Bipolar disorder ([place])   . CAD (coronary artery disease)    a. LHC 11/2013 done for CP/fluid retention: mild  disease in prox LAD, mild-mod disease in mRCA, EF 60% with normal LVEDP. b. Normal nuc 03/2016.  Marland Kitchen CHF (congestive heart failure) ([place])   . Chronic diastolic CHF (congestive heart failure) ([place])   . CKD (chronic kidney disease), stage II   . COPD (chronic obstructive pulmonary disease) ([facility])    a. nocturnal O2.  Marland Kitchen COPD (chronic obstructive pulmonary disease) ([place])   . Coronary artery disease   . Decreased urine stream   . Diabetes mellitus   . Diabetes mellitus without complication ([place])   . Dyspnea   . Family history of adverse reaction to anesthesia    mom gets nauseated  . GERD (gastroesophageal reflux disease)    takes Pepcid daily  . History of blood clots    left leg 3-21yrs ago  . Hyperlipidemia   . Hypertension   . Hypertriglyceridemia   . Inguinal hernia, left 01/2015  . Muscle spasm   . Open wound of genital labia   . Peripheral neuropathy   . RBBB   . Seizures ([place])   . Sepsis ([place]) 01/19/2018  . Sinus tachycardia    a. persistent since 2009.  Marland Kitchen Smokers' cough ([place])   . Stroke [facility]) 1989   left sided weakness  . TIA (transient ischemic attack)   . Tobacco abuse   . Vulvar abscess 01/23/2018     Social History   Socioeconomic History  . Marital status: Married    Spouse name: Not on file  . Number of children: 2  . Years of education: Not on file  . Highest education level: Not on file  Occupational History  . Not on file  Tobacco Use  . Smoking status: Current Some Day Smoker    Packs/day: 0.50    Years: 36.00    Pack years: 18.00    Types: Cigarettes    Last attempt to quit: 01/18/2018    Years since quitting: 1.7  . Smokeless tobacco: Never Used  Substance and Sexual Activity  . Alcohol use: No  . Drug use: No  . Sexual activity: Not Currently  Other Topics Concern  . Not on file  Social History Narrative   ** Merged History Encounter **       Lives in [place]   Married but lives with Boyfriend - not legally separated   Disabled - for  BiPolar, Seizure disorder, diabetes   Formerly worked at [employer] Strain:   . Difficulty of Paying Living Expenses:   Food Insecurity:   . Worried About Charity fundraiser in the Last Year:   . Arboriculturist in the Last Year:   Transportation Needs:   . Film/video editor (Medical):   Marland Kitchen Lack of Transportation (Non-Medical):   Physical Activity:   . Days of Exercise per Week:   . Minutes of Exercise per Session:   Stress:   . Feeling of Stress :   Social Connections:   . Frequency  of Communication with Friends and Family:   . Frequency of Social Gatherings with Friends and Family:   . Attends Religious Services:   . Active Member of Clubs or Organizations:   . Attends Archivist Meetings:   Marland Kitchen Marital Status:   Intimate Partner Violence:   . Fear of Current or Ex-Partner:   . Emotionally Abused:   Marland Kitchen Physically Abused:   . Sexually Abused:     Past Surgical History:  Procedure Laterality Date  . HERNIA REPAIR    . INCISION AND DRAINAGE ABSCESS N/A 01/26/2018   Procedure: INCISION AND DEBRIDEMENT OF VULVAR NECROTIZING SOFT TISSUE INFECTION;  Surgeon: Greer Pickerel, MD;  Location: [facility];  Service: General;  Laterality: N/A;  . INCISION AND DRAINAGE PERIRECTAL ABSCESS N/A 01/22/2018   Procedure: IRRIGATION AND DEBRIDEMENT LABIAL/VULVAR AREA;  Surgeon: Coralie Keens, MD;  Location: [place];  Service: General;  Laterality: N/A;  . INCISION AND DRAINAGE PERIRECTAL ABSCESS N/A 01/29/2018   Procedure: IRRIGATION AND DEBRIDEMENT VULVA;  Surgeon: Excell Seltzer, MD;  Location: [place];  Service: General;  Laterality: N/A;  . INGUINAL HERNIA REPAIR Left 04/08/2015   Procedure: OPEN LEFT INGUINAL HERNIA REPAIR WITH MESH;  Surgeon: Ralene Ok, MD;  Location: [place];  Service: General;  Laterality: Left;  . INSERTION OF MESH Left 04/08/2015   Procedure: INSERTION OF MESH;  Surgeon: Ralene Ok, MD;  Location:  [place];  Service: General;  Laterality: Left;  . LAPAROSCOPY     Endometriosis  . LEFT HEART CATHETERIZATION WITH CORONARY ANGIOGRAM N/A 12/05/2013   Procedure: LEFT HEART CATHETERIZATION WITH CORONARY ANGIOGRAM;  Surgeon: Jettie Booze, MD;  [facility] C[facility] LAB;  Service: Cardiovascular;  Laterality: N/A;  . right kidney drained    . TEE WITHOUT CARDIOVERSION N/A 11/28/2013   Procedure: TRANSESOPHAGEAL ECHOCARDIOGRAM (TEE);  Surgeon: Thayer Headings, MD;  Location: [facility] ENDOSCOPY;  Service: Cardiovascular;  Laterality: N/A;    Family History  Problem Relation Age of Onset  . Venous thrombosis Brother   . Other Brother        BRAIN TUMOR  . Asthma Father   . Diabetes Father   . Coronary artery disease Mother   . Hypertension Mother   . Diabetes Mother   . Asthma Sister   . Diabetes type II Brother     Allergies  Allergen Reactions  . Adhesive [Tape] Rash and Other (See Comments)    TAKES OFF THE SKIN (CERTAIN MEDICAL TAPES DO THIS!!)  . Metoprolol Shortness Of Breath    Occurrence of shortness of breath after 3 days  . Montelukast Shortness Of Breath  . Morphine Sulfate Anaphylaxis, Shortness Of Breath and Nausea And Vomiting    Swollen Throat - Able to tolerate dilaudid  . Penicillins Anaphylaxis, Hives and Shortness Of Breath    Throat swells Has patient had a PCN reaction causing immediate rash, facial/tongue/throat swelling, SOB or lightheadedness with hypotension: Yes Has patient had a PCN reaction causing severe rash involving mucus membranes or skin necrosis: No Has patient had a PCN reaction that required hospitalization: Yes Has patient had a PCN reaction occurring within the last 10 years: No If all of the above answers are "NO", then may proceed with Cephalosporin use.   . Prednisone Anaphylaxis  . Diltiazem Swelling  . Gabapentin Swelling    Current Outpatient Medications on File Prior to Visit  Medication Sig Dispense Refill  . acetaminophen (TYLENOL)  500 MG tablet Take 500 mg by mouth every 4 (four)  hours as needed for moderate pain.     Marland Kitchen ALPRAZolam (XANAX) 0.5 MG tablet Take 1 tablet (0.5 mg total) by mouth 2 (two) times daily as needed for anxiety. (Patient taking differently: Take 1 mg by mouth daily. )  0  . aspirin EC 81 MG tablet Take 81 mg by mouth every morning.    . budesonide (PULMICORT) 0.5 MG/2ML nebulizer solution Take 0.5 mg by nebulization 2 (two) times daily.    . budesonide-formoterol (SYMBICORT) 80-4.5 MCG/ACT inhaler INHALE 2 PUFFS BY MOUTH EVERY 12 HOURS TO PREVENT COUGH OR WHEEZING *RINSE MOUTH AFTER EACH USE* (Patient not taking: Reported on 08/16/2019) 10.2 g 6  . busPIRone (BUSPAR) 30 MG tablet Take 30 mg by mouth 2 (two) times daily.    . famotidine (PEPCID) 20 MG tablet TAKE 1 TABLET (20 MG TOTAL) BY MOUTH 2 (TWO) TIMES DAILY. FOR HEARTBURN. 180 tablet 1  . FLUoxetine (PROZAC) 40 MG capsule Take 40 mg by mouth at bedtime.     Marland Kitchen glucose blood (ONETOUCH VERIO) test strip 1 each by Other route 3 (three) times daily. E11.9; E10.42 100 each 2  . Insulin Pen Needle 32G X 4 MM MISC Used to give insulin injections twice daily. 100 each 11  . insulin regular human CONCENTRATED (HUMULIN R) 500 UNIT/ML injection Inject 0.8 mLs (400 Units total) into the skin 2 (two) times daily with a meal. DOSAGE IS CORRECT. PLEASE FILL AS PRESCRIBED 48 mL 2  . Insulin Syringe-Needle U-100 (INSULIN SYRINGE 1CC/30GX1/2") 30G X 1/2" 1 ML MISC 1 each by Does not apply route 2 (two) times daily. E11.9 200 each 0  . ipratropium-albuterol (DUONEB) 0.5-2.5 (3) MG/3ML SOLN Take 3 mLs by nebulization every 4 (four) hours as needed (wheezing/shortness of breath). 360 mL 0  . OneTouch Delica Lancets 99991111 MISC 1 each by Does not apply route 3 (three) times daily. Use to monitor glucose levels three times daily; E11.9; E10.42 100 each 11  . OXYGEN Inhale 4 L into the lungs at bedtime.     . potassium chloride 20 MEQ TBCR Take 20 mEq by mouth daily. 60 tablet 2    . pregabalin (LYRICA) 300 MG capsule Take 1 capsule (300 mg total) by mouth 2 (two) times daily. For neuropathy. 180 capsule 0  . rOPINIRole (REQUIP) 1 MG tablet TAKE 1 TABLET BY MOUTH EVERY NIGHT AT BEDTIME FOR RESTLESS LEGS. 90 tablet 1  . rosuvastatin (CRESTOR) 10 MG tablet Take 1 tablet (10 mg total) by mouth daily. 90 tablet 3  . Semaglutide,0.25 or 0.5MG /DOS, (OZEMPIC, 0.25 OR 0.5 MG/DOSE,) 2 MG/1.5ML SOPN Inject 0.25 mg into the skin once a week. 1 pen 5  . spironolactone (ALDACTONE) 50 MG tablet TAKE 1 TABLET BY MOUTH ONCE A DAY 90 tablet 1  . tobramycin (TOBREX) 0.3 % ophthalmic solution Place 1 drop into both eyes daily.     Marland Kitchen torsemide (DEMADEX) 20 MG tablet TAKE 1 TABLET BY MOUTH 4 TIMES DAILY (Patient taking differently: 40 mg 2 (two) times daily. ) 360 tablet 3  . traZODone (DESYREL) 100 MG tablet Take 100 mg by mouth daily.     Marland Kitchen ULTICARE MINI PEN NEEDLES 31G X 6 MM MISC USE AS DIRECTED THREE TIMES DAILY 100 each 0   No current facility-administered medications on file prior to visit.    LMP 11/11/2011    Objective:   Physical Exam  Constitutional: She is oriented to person, place, and time.  Respiratory: Effort normal.  No cough or  respiratory distress  Neurological: She is alert and oriented to person, place, and time.  Psychiatric: She has a normal mood and affect.           Assessment & Plan:

## 2019-10-07 NOTE — Assessment & Plan Note (Signed)
No abrupt weight fluctuations per patient, compliant to torsemide, spironolactone. Following with cardiology.

## 2019-10-07 NOTE — Assessment & Plan Note (Addendum)
Asymptomatic now, evaluated in ED last week to rule out ACS. Negative testing. CTA with incidental finding of right breast mass, mammogram order placed.  Strongly encouraged she touch base with her cardiologist, she has called several times and will continue to do so.   I also recommended she call her cardiologist's office when having acute symptoms as well.   ED notes, labs, imaging reviewed.

## 2019-10-07 NOTE — Assessment & Plan Note (Signed)
Referral placed for [facility].

## 2019-10-07 NOTE — Assessment & Plan Note (Signed)
Incidental finding on CTA from March 2021. Diagnostic mammogram with ultrasounds bilaterally placed.

## 2019-10-07 NOTE — Assessment & Plan Note (Signed)
Doing well on Lyrica 300 mg BID, cannot tolerate a dose decrease. Will discuss at next routine visit.

## 2019-10-07 NOTE — Progress Notes (Signed)
Virtual Visit via Telephone Note  I connected with [name] on 10/08/19 at  9:30 AM EDT by telephone and verified that I am speaking with the correct person using two identifiers.  Location: Patient: Home Provider: [facility] - [id number], [place], [place], [place] [address]   I discussed the limitations, risks, security and privacy concerns of performing an evaluation and management service by telephone and the availability of in person appointments. I also discussed with the patient that there may be a patient responsible charge related to this service. The patient expressed understanding and agreed to proceed.  Patient consented to consult via telephone: Yes People present and their role in pt care: Pt    Referring provider: Pleas Koch, NP  HPI:  49 year old female current smoker followed in our office for severe COPD, restrictive lung disease, chronic hypoxic respiratory failure  PMH: Chronic pain syndrome, hyperlipidemia, TIA, bipolar, hypertension, type 2 diabetes, chronic kidney disease, IBS Smoker/ Smoking History: Current Smoker. 0.5 - 1 ppd now. 72 pack history.  Maintenance:  Symbicort 80 Pt of: Dr. Mortimer Fries  10/08/2019  - Visit   49 year old female current smoker smoking 0.5 to 1 pack/day.  Smoking history is a 72-pack-year smoking history.  She reports she is been out of Symbicort 80 for 2 months.  She needs refills of this today.  She feels that she is at her baseline for shortness of breath.  She was scheduled for an appointment with our office today to continue on oxygen therapy.  Last documented note showed 2 L of oxygen at rest as well as 3-1/2 L with nighttime use.  We will discuss this today.  Questionaires / Pulmonary Flowsheets:   MMRC: mMRC Dyspnea Scale mMRC Score  10/08/2019 3    Epworth:  Results of the Epworth flowsheet 12/21/2017  Sitting and reading 3  Watching TV 3  Sitting, inactive in a public place (e.g. a theatre or  a meeting) 3  As a passenger in a car for an hour without a break 3  Lying down to rest in the afternoon when circumstances permit 2  Sitting and talking to someone 3  Sitting quietly after a lunch without alcohol 3  In a car, while stopped for a few minutes in traffic 3  Total score 23    Tests:   Desat walk at rest on RA. Sat was 83% HR 87. Walked 180 feet at slow waddling pace complained of dyspnea, but appeared only mildly dyspneic. Sat dropped to 86% and HR 92.  Oxygen @2L  at rest--91% at rest.   CXR 03/29/2017>> change to chronic bronchitis, elevated right diaphragm. CXR : 03/17/2017>>Cardiomegaly with mild pulmonary vascular congestion. Echo 03/15/2017>> EF 65-70% 08/22/2016>> Spirometry shows moderate restriction. Sleep study 02/21/14; negative for OSA.   FENO:  No results found for: NITRICOXIDE  PFT: No flowsheet data found.  WALK:  SIX MIN WALK 03/29/2017 10/28/2016 08/22/2016  Supplimental Oxygen during Test? (L/min) No No -  Tech Comments: steady walk, had to stop at the end of 1st lap, pt states she was fatique, no desat, no other comlications TA/CMA slow to normal pace/SOB lap 2//lmr walked at a steady pace/stopped to cough 4 times, unable to do last lap/TA/CMA    Imaging: DG Chest 2 View  Result Date: 09/23/2019 CLINICAL DATA:  Chest pain EXAM: CHEST - 2 VIEW COMPARISON:  03/21/2019 FINDINGS: No pleural effusion. Lobulated contour of the right diaphragm. No consolidation or pneumothorax. Stable cardiomediastinal silhouette. IMPRESSION: No  active cardiopulmonary disease. Electronically Signed   By: Donavan Foil M.D.   On: 09/23/2019 17:16   CT Head Wo Contrast  Result Date: 09/23/2019 CLINICAL DATA:  Headache chest pain hyperglycemia EXAM: CT HEAD WITHOUT CONTRAST TECHNIQUE: Contiguous axial images were obtained from the base of the skull through the vertex without intravenous contrast. COMPARISON:  CT 03/21/2019 FINDINGS: Brain: No acute territorial infarction,  hemorrhage, or intracranial mass. The ventricles are nonenlarged. Vascular: No hyperdense vessels. Scattered calcifications at the carotid siphon Skull: Normal. Negative for fracture or focal lesion. Sinuses/Orbits: No acute finding. Other: Metallic BB in the left anterior scalp IMPRESSION: Negative non contrasted CT appearance of the brain Electronically Signed   By: Donavan Foil M.D.   On: 09/23/2019 18:31   CT ANGIO CHEST AORTA W/CM & OR WO/CM  Result Date: 09/23/2019 CLINICAL DATA:  Chest pain hyperglycemia hypertension EXAM: CT ANGIOGRAPHY CHEST WITH CONTRAST TECHNIQUE: Multidetector CT imaging of the chest was performed using the standard protocol during bolus administration of intravenous contrast. Multiplanar CT image reconstructions and MIPs were obtained to evaluate the vascular anatomy. CONTRAST:  52mL OMNIPAQUE IOHEXOL 350 MG/ML SOLN COMPARISON:  Chest x-ray 09/23/2019 01/06/2006 FINDINGS: Cardiovascular: Non contrasted images of the chest demonstrate no acute intramural hematoma. Aorta is nonaneurysmal. No dissection is seen. Coronary vascular calcifications. Cardiac size within normal limits. Trace pericardial effusion or thickening. Mediastinum/Nodes: Midline trachea. Subcentimeter thyroid nodules, no further workup recommended based on lesion size and patient age. No suspicious adenopathy. Esophagus within normal limits. Lungs/Pleura: No consolidation, pleural effusion or pneumothorax. Mild apical emphysema. Scattered pulmonary nodules, with the largest visualized in the subpleural right lower lobe measuring up to 9 mm in size, series 6, image number 98 though stable as compared with 2007 chest CT. Upper Abdomen: No acute abnormality. Musculoskeletal: No chest wall abnormality. No acute or significant osseous findings. Incidental note is made of a 2.3 cm mass in the lower right breast. Review of the MIP images confirms the above findings. IMPRESSION: 1. Negative for acute aortic dissection or  aneurysm. 2. 2.3 cm mass within the lower right breast. Recommend correlation with outpatient mammography and ultrasound 3. Mild apical emphysema 4. Scattered pulmonary nodules, stable since 2007 and therefore felt benign Aortic Atherosclerosis (ICD10-I70.0) and Emphysema (ICD10-J43.9). Electronically Signed   By: Donavan Foil M.D.   On: 09/23/2019 18:39    Lab Results:  CBC    Component Value Date/Time   WBC 11.3 (H) 09/23/2019 1624   RBC 4.66 09/23/2019 1624   HGB 13.6 09/23/2019 1624   HCT 40.9 09/23/2019 1624   PLT 203 09/23/2019 1624   MCV 87.8 09/23/2019 1624   MCH 29.2 09/23/2019 1624   MCHC 33.3 09/23/2019 1624   RDW 16.2 (H) 09/23/2019 1624   LYMPHSABS 1.8 07/26/2019 1337   MONOABS 0.7 07/26/2019 1337   EOSABS 0.1 07/26/2019 1337   BASOSABS 0.1 07/26/2019 1337    BMET    Component Value Date/Time   NA 132 (L) 09/23/2019 1624   NA 137 01/08/2018 1117   K 3.7 09/23/2019 1624   CL 86 (L) 09/23/2019 1624   CO2 34 (H) 09/23/2019 1624   GLUCOSE 487 (H) 09/23/2019 1624   BUN 44 (H) 09/23/2019 1624   BUN 31 (H) 01/08/2018 1117   CREATININE 1.41 (H) 09/23/2019 1624   CREATININE 1.59 (H) 07/18/2019 1506   CALCIUM 10.0 09/23/2019 1624   GFRNONAA 43 (L) 09/23/2019 1624   GFRAA 50 (L) 09/23/2019 1624    BNP  Component Value Date/Time   BNP 54.6 10/19/2017 2148   BNP <4.0 05/24/2016 0938    ProBNP    Component Value Date/Time   PROBNP 24.7 04/01/2013 1600    Specialty Problems      Pulmonary Problems   Asthma-COPD overlap syndrome (HCC)    Spirometry 06/14/2016  FEV1 2.17 (75%)  Ratio 82 with min curvature after am spiriva > d/c'd 06/14/2016  - 06/14/2016    try symbicort 80 2bid   - Spirometry 08/22/2016  No obst p am symb - 08/22/2016   continue symb 80 2bid   - 12/05/2016 notified by Dr Mila Palmer office they would like to make all changes outside of pulmonary rx as pt getting confused with instructions  - 12/29/2016  After extensive coaching HFA effectiveness =    90%       Sleep apnea   Allergic rhinitis with postnasal drip   COPD exacerbation (HCC)   Orthopnea   Simple chronic bronchitis (HCC)   Upper airway cough syndrome    Spirometry 08/22/2016  wnl during flare of "wheeze" while on acei > rec trial off ACEi  - 12/05/2016 notified by Dr Mila Palmer office they would like to make all changes outside of pulmonary rx as pt getting confused with instructions       Chronic respiratory failure with hypoxia (HCC)/ nocturnal 02 dep     10/28/2016   Walked RA  2 laps @ 185 ft each stopped due to  Sob, sat still 91% at nl pace  - ONO RA  11/09/16  > desat < 89% x 383 min so rec 02 2lpm and repeat ONO on 2lpm - ONO 2lpm 11/23/16  desat x 160 min  > rec 12/05/2016 = repeat ono on 3lpm NP,  - 12/05/2016 notified by Dr Mila Palmer office they would like to make all changes outside of pulmonary rx as pt getting confused with instructions   - ONO 3lpm re- ordered 12/29/2016 > done 01/18/17 with sats < 89% x 58 min so 01/23/2017  rec 4lpm hs and repeat > done 02/02/17 and still  desat >  02/06/2017 rec formal sleep study      Nocturnal hypoxemia   Restrictive lung disease    Seen on 2018 office spirometry         Allergies  Allergen Reactions  . Adhesive [Tape] Rash and Other (See Comments)    TAKES OFF THE SKIN (CERTAIN MEDICAL TAPES DO THIS!!)  . Metoprolol Shortness Of Breath    Occurrence of shortness of breath after 3 days  . Montelukast Shortness Of Breath  . Morphine Sulfate Anaphylaxis, Shortness Of Breath and Nausea And Vomiting    Swollen Throat - Able to tolerate dilaudid  . Penicillins Anaphylaxis, Hives and Shortness Of Breath    Throat swells Has patient had a PCN reaction causing immediate rash, facial/tongue/throat swelling, SOB or lightheadedness with hypotension: Yes Has patient had a PCN reaction causing severe rash involving mucus membranes or skin necrosis: No Has patient had a PCN reaction that required hospitalization: Yes Has patient had a  PCN reaction occurring within the last 10 years: No If all of the above answers are "NO", then may proceed with Cephalosporin use.   . Prednisone Anaphylaxis  . Diltiazem Swelling  . Gabapentin Swelling    Immunization History  Administered Date(s) Administered  . Influenza Split 05/25/2011  . Influenza Whole 03/28/2008, 04/30/2009, 04/10/2016  . Influenza,inj,Quad PF,6+ Mos 06/13/2017, 05/22/2018, 03/12/2019  . Influenza-Unspecified 07/25/2014, 05/12/2015  . Pneumococcal  Conjugate-13 05/11/2016  . Pneumococcal Polysaccharide-23 06/15/2011  . Tdap 06/15/2011    Past Medical History:  Diagnosis Date  . Anxiety    takes Prozac daily  . Anxiety   . Aortic valve calcification   . Asthma    Advair and Spirva daily  . Asthma   . Bipolar disorder ([place])   . CAD (coronary artery disease)    a. LHC 11/2013 done for CP/fluid retention: mild disease in prox LAD, mild-mod disease in mRCA, EF 60% with normal LVEDP. b. Normal nuc 03/2016.  Marland Kitchen CHF (congestive heart failure) ([place])   . Chronic diastolic CHF (congestive heart failure) ([place])   . Chronic heart failure with preserved ejection fraction ([place]) 11/16/2018  . CKD (chronic kidney disease), stage II   . COPD (chronic obstructive pulmonary disease) (HCC)    a. nocturnal O2.  Marland Kitchen COPD (chronic obstructive pulmonary disease) ([place])   . Coronary artery disease   . Decreased urine stream   . Diabetes mellitus   . Diabetes mellitus without complication ([place])   . Dyspnea   . Family history of adverse reaction to anesthesia    mom gets nauseated  . GERD (gastroesophageal reflux disease)    takes Pepcid daily  . History of blood clots    left leg 3-80yrs ago  . Hyperlipidemia   . Hypertension   . Hypertriglyceridemia   . Inguinal hernia, left 01/2015  . Muscle spasm   . Open wound of genital labia   . Peripheral neuropathy   . RBBB   . Seizures ([place])   . Sepsis ([place]) 01/19/2018  . Sinus tachycardia    a. persistent since 2009.  Marland Kitchen  Smokers' cough ([place])   . Stroke [facility]) 1989   left sided weakness  . TIA (transient ischemic attack)   . Tobacco abuse   . Vulvar abscess 01/23/2018    Tobacco History: Social History   Tobacco Use  Smoking Status Current Some Day Smoker  . Packs/day: 2.00  . Years: 36.00  . Pack years: 72.00  . Types: Cigarettes  . Start date: 07/11/1981  Smokeless Tobacco Never Used  Tobacco Comment   1 ppd now   Ready to quit: Not Answered Counseling given: Yes Comment: 1 ppd now  Smoking assessment and cessation counseling  Patient currently smoking: 0.5 -1ppd  I have advised the patient to quit/stop smoking as soon as possible due to high risk for multiple medical problems.  It will also be very difficult for [place] to manage patient's  respiratory symptoms and status if we continue to expose her lungs to a known irritant.  We do not advise e-cigarettes as a form of stopping smoking.  Patient is not willing to quit smoking.  Tried chantix - bad dreams  Tried wellbutrin - no help  NRT patches - dont stick to patient  NRT gum / lozenges - short term  NEVER TRIED TOGETHER   I have advised the patient that we can assist and have options of nicotine replacement therapy, provided smoking cessation education today, provided smoking cessation counseling, and provided cessation resources.  Follow-up next office visit office visit for assessment of smoking cessation.    Smoking cessation counseling advised for: 8 min     Outpatient Encounter Medications as of 10/08/2019  Medication Sig  . acetaminophen (TYLENOL) 500 MG tablet Take 500 mg by mouth every 4 (four) hours as needed for moderate pain.   Marland Kitchen ALPRAZolam (XANAX) 0.5 MG tablet Take 1 tablet (0.5 mg total) by  mouth 2 (two) times daily as needed for anxiety. (Patient taking differently: Take 1 mg by mouth daily. )  . aspirin EC 81 MG tablet Take 81 mg by mouth every morning.  . budesonide-formoterol (SYMBICORT) 80-4.5 MCG/ACT inhaler INHALE  2 PUFFS BY MOUTH EVERY 12 HOURS TO PREVENT COUGH OR WHEEZING *RINSE MOUTH AFTER EACH USE*  . busPIRone (BUSPAR) 30 MG tablet Take 30 mg by mouth 2 (two) times daily.  . famotidine (PEPCID) 20 MG tablet TAKE 1 TABLET (20 MG TOTAL) BY MOUTH 2 (TWO) TIMES DAILY. FOR HEARTBURN.  Marland Kitchen FLUoxetine (PROZAC) 40 MG capsule Take 40 mg by mouth at bedtime.   Marland Kitchen glucose blood (ONETOUCH VERIO) test strip 1 each by Other route 3 (three) times daily. E11.9; E10.42  . Insulin Pen Needle 32G X 4 MM MISC Used to give insulin injections twice daily.  . insulin regular human CONCENTRATED (HUMULIN R) 500 UNIT/ML injection Inject 0.8 mLs (400 Units total) into the skin 2 (two) times daily with a meal. DOSAGE IS CORRECT. PLEASE FILL AS PRESCRIBED  . Insulin Syringe-Needle U-100 (INSULIN SYRINGE 1CC/30GX1/2") 30G X 1/2" 1 ML MISC 1 each by Does not apply route 2 (two) times daily. E11.9  . ipratropium-albuterol (DUONEB) 0.5-2.5 (3) MG/3ML SOLN Take 3 mLs by nebulization every 4 (four) hours as needed (wheezing/shortness of breath).  Glory Rosebush Delica Lancets [id number] MISC 1 each by Does not apply route 3 (three) times daily. Use to monitor glucose levels three times daily; E11.9; E10.42  . OXYGEN Inhale 4 L into the lungs at bedtime.   . potassium chloride 20 MEQ TBCR Take 20 mEq by mouth daily.  . pregabalin (LYRICA) 300 MG capsule Take 1 capsule (300 mg total) by mouth 2 (two) times daily. For neuropathy.  Marland Kitchen rOPINIRole (REQUIP) 1 MG tablet TAKE 1 TABLET BY MOUTH EVERY NIGHT AT BEDTIME FOR RESTLESS LEGS.  . rosuvastatin (CRESTOR) 10 MG tablet Take 1 tablet (10 mg total) by mouth daily.  . Semaglutide,0.25 or 0.5MG /DOS, (OZEMPIC, 0.25 OR 0.5 MG/DOSE,) 2 MG/1.5ML SOPN Inject 0.25 mg into the skin once a week.  . spironolactone (ALDACTONE) 50 MG tablet TAKE 1 TABLET BY MOUTH ONCE A DAY  . tobramycin (TOBREX) 0.3 % ophthalmic solution Place 1 drop into both eyes daily.   Marland Kitchen torsemide (DEMADEX) 20 MG tablet TAKE 1 TABLET BY MOUTH 4  TIMES DAILY (Patient taking differently: 40 mg 2 (two) times daily. )  . traZODone (DESYREL) 100 MG tablet Take 100 mg by mouth daily.   Marland Kitchen ULTICARE MINI PEN NEEDLES 31G X 6 MM MISC USE AS DIRECTED THREE TIMES DAILY  . [DISCONTINUED] budesonide (PULMICORT) 0.5 MG/2ML nebulizer solution Take 0.5 mg by nebulization 2 (two) times daily.  . [DISCONTINUED] budesonide-formoterol (SYMBICORT) 80-4.5 MCG/ACT inhaler INHALE 2 PUFFS BY MOUTH EVERY 12 HOURS TO PREVENT COUGH OR WHEEZING *RINSE MOUTH AFTER EACH USE* (Patient not taking: Reported on 08/16/2019)   No facility-administered encounter medications on file as of 10/08/2019.     Review of Systems  Review of Systems  Constitutional: Positive for fatigue. Negative for activity change and fever.  HENT: Negative for sinus pressure, sinus pain and sore throat.   Respiratory: Positive for cough, shortness of breath and wheezing.   Cardiovascular: Negative for chest pain and palpitations.  Gastrointestinal: Negative for diarrhea, nausea and vomiting.  Musculoskeletal: Negative for arthralgias.  Neurological: Negative for dizziness.  Psychiatric/Behavioral: Negative for sleep disturbance. The patient is not nervous/anxious.      Physical Exam  LMP  11/11/2011   Wt Readings from Last 5 Encounters:  09/23/19 283 lb (128.4 kg)  08/16/19 272 lb (123.4 kg)  07/26/19 272 lb 8 oz (123.6 kg)  07/15/19 270 lb 8 oz (122.7 kg)  06/04/19 283 lb 8 oz (128.6 kg)    BMI Readings from Last 5 Encounters:  09/23/19 45.68 kg/m  08/16/19 46.69 kg/m  07/26/19 46.77 kg/m  07/15/19 46.43 kg/m  06/04/19 48.66 kg/m   DEFERRED D/T Virtual Visit     Assessment & Plan:   Chronic respiratory failure with hypoxia (HCC)/ nocturnal 02 dep  Continues to utilize oxygen 3-1/2 L at night and when napping Patient benefits from oxygen use She needs oxygen in order to survive  Plan: We will coordinate walking [place] office when patient obtains pulmonary  function testing We will order pulmonary function test to further evaluate restriction   Cigarette smoker Plan: Referral to lung cancer screening program given smoking history Emphasized importance of the patient stop smoking  Asthma-COPD overlap syndrome ([place]) Has been off of Symbicort 80 for 2 months Currently using duo nebs May have asthma, no full pulmonary function testing on file 72-pack-year smoking history Restrictive lung disease on office spirometry is in 2018  Plan: We will refill Symbicort 80 Continue duo nebs as needed Emphasized need to stop smoking Referral to lung cancer screening program given smoking history  Restrictive lung disease Likely multifactorial given back surgeries, obesity, elevated hemidiaphragm   Plan:  PFTs ordered    Follow Up Instructions:  Return in about 3 months (around 01/08/2020), or if symptoms worsen or fail to improve, for Follow up for PFT, [facility] - Dr. Mortimer Fries.   I discussed the assessment and treatment plan with the patient. The patient was provided an opportunity to ask questions and all were answered. The patient agreed with the plan and demonstrated an understanding of the instructions.   The patient was advised to call back or seek an in-person evaluation if the symptoms worsen or if the condition fails to improve as anticipated.  I provided 25 minutes of non-face-to-face time during this encounter.   Lauraine Rinne, NP

## 2019-10-07 NOTE — Assessment & Plan Note (Signed)
Continued with poor control, now on 400 units of Humulin BID. Following with endocrinology.

## 2019-10-08 ENCOUNTER — Encounter: Payer: Self-pay | Admitting: Pulmonary Disease

## 2019-10-08 ENCOUNTER — Ambulatory Visit (INDEPENDENT_AMBULATORY_CARE_PROVIDER_SITE_OTHER): Payer: BC Managed Care – PPO | Admitting: Pulmonary Disease

## 2019-10-08 ENCOUNTER — Telehealth: Payer: Self-pay | Admitting: Pulmonary Disease

## 2019-10-08 DIAGNOSIS — J449 Chronic obstructive pulmonary disease, unspecified: Secondary | ICD-10-CM

## 2019-10-08 DIAGNOSIS — J9611 Chronic respiratory failure with hypoxia: Secondary | ICD-10-CM

## 2019-10-08 DIAGNOSIS — F17210 Nicotine dependence, cigarettes, uncomplicated: Secondary | ICD-10-CM

## 2019-10-08 DIAGNOSIS — J984 Other disorders of lung: Secondary | ICD-10-CM | POA: Insufficient documentation

## 2019-10-08 DIAGNOSIS — G4730 Sleep apnea, unspecified: Secondary | ICD-10-CM | POA: Diagnosis not present

## 2019-10-08 MED ORDER — BUDESONIDE-FORMOTEROL FUMARATE 80-4.5 MCG/ACT IN AERO: INHALATION_SPRAY | RESPIRATORY_TRACT | 3 refills | Status: DC

## 2019-10-08 NOTE — Assessment & Plan Note (Signed)
Has been off of Symbicort 80 for 2 months Currently using duo nebs May have asthma, no full pulmonary function testing on file 72-pack-year smoking history Restrictive lung disease on office spirometry is in 2018  Plan: We will refill Symbicort 80 Continue duo nebs as needed Emphasized need to stop smoking Referral to lung cancer screening program given smoking history

## 2019-10-08 NOTE — Assessment & Plan Note (Signed)
Plan: Referral to lung cancer screening program given smoking history Emphasized importance of the patient stop smoking

## 2019-10-08 NOTE — Patient Instructions (Addendum)
You were seen today by Lauraine Rinne, NP  for:  1. Asthma-COPD overlap syndrome (HCC)  Continue Symbicort >>> 2 puffs in the morning right when you wake up, rinse out your mouth after use, 12 hours later 2 puffs, rinse after use >>> Take this daily, no matter what >>> This is not a rescue inhaler   Continue duo nebs every 4 hours as needed for shortness of breath or wheezing  2. Chronic respiratory failure with hypoxia (HCC)/ nocturnal 02 dep   We will get you set up for a walk in our [place] office to further evaluate your oxygen needs  Continue oxygen therapy as prescribed  >>>maintain oxygen saturations greater than 88 percent  >>>if unable to maintain oxygen saturations please contact the office  >>>do not smoke with oxygen  >>>can use nasal saline gel or nasal saline rinses to moisturize nose if oxygen causes dryness  3. Cigarette smoker  We recommend that you stop smoking.  >>>You need to set a quit date >>>If you have friends or family who smoke, let them know you are trying to quit and not to smoke around you or in your living environment  Smoking Cessation Resources:  1 800 QUIT NOW  >>> Patient to call this resource and utilize it to help support her quit smoking >>> Keep up your hard work with stopping smoking  You can also contact the [facility] >>>For smoking cessation classes call [phone]  We do not recommend using e-cigarettes as a form of stopping smoking  You can sign up for smoking cessation support texts and information:  >>>https://smokefree.gov/smokefreetxt  We will refer you today to our lung cancer screening program >>>This is based off of your 72 pack-year smoking history >>> This is a recommendation from the [place] preventative services task force (USPSTF) >>>The USPSTF recommends annual screening for lung cancer with low-dose computed tomography (LDCT) in adults aged 72 to 73 years who have a 20 pack-year smoking history and  currently smoke or have quit within the past 15 years. Screening should be discontinued once a person has not smoked for 15 years or develops a health problem that substantially limits life expectancy or the ability or willingness to have curative lung surgery.      4.  Restrictive lung disease  Pulmonary function tests ordered   Follow Up:    Return in about 3 months (around 01/08/2020), or if symptoms worsen or fail to improve, for Follow up for PFT, [facility] - Dr. Mortimer Fries.   Please do your part to reduce the spread of COVID-19:      Reduce your risk of any infection  and COVID19 by using the similar precautions used for avoiding the common cold or flu:  Marland Kitchen Wash your hands often with soap and warm water for at least 20 seconds.  If soap and water are not readily available, use an alcohol-based hand sanitizer with at least 60% alcohol.  . If coughing or sneezing, cover your mouth and nose by coughing or sneezing into the elbow areas of your shirt or coat, into a tissue or into your sleeve (not your hands). Langley Gauss A MASK when in public  . Avoid shaking hands with others and consider head nods or verbal greetings only. . Avoid touching your eyes, nose, or mouth with unwashed hands.  . Avoid close contact with people who are sick. . Avoid places or events with large numbers of people in one location, like concerts or sporting  events. . If you have some symptoms but not all symptoms, continue to monitor at home and seek medical attention if your symptoms worsen. . If you are having a medical emergency, call 911.   [place] / e-Visit: [email or web address]         [facility]: [facility]: [id number]                   [facility]: [id number]     It is flu season:   >>> Best ways to protect herself from the flu: Receive  the yearly flu vaccine, practice good hand hygiene washing with soap and also using hand sanitizer when available, eat a nutritious meals, get adequate rest, hydrate appropriately   Please contact the office if your symptoms worsen or you have concerns that you are not improving.   Thank you for choosing [facility] for your healthcare, and for allowing [place] to partner with you on your healthcare journey. I am thankful to be able to provide care to you today.   Wyn Quaker FNP-C

## 2019-10-08 NOTE — Progress Notes (Signed)
ATC patient to schedule PFT and qualifying walk in [place] unable to reach left message to call office back

## 2019-10-08 NOTE — Addendum Note (Signed)
Addended by: Amado Coe on: 10/08/2019 10:53 AM   Modules accepted: Orders

## 2019-10-08 NOTE — Assessment & Plan Note (Addendum)
Likely multifactorial given back surgeries, obesity, elevated hemidiaphragm   Plan:  PFTs ordered

## 2019-10-08 NOTE — Telephone Encounter (Signed)
ATC patient after telephone visit. Patient needs PFT scheduled in [place] and a Covid test in [place].  Patient needs a qualifying walk on the same day as her PFT in office.   Unable to reach patient, left message to call office back

## 2019-10-08 NOTE — Assessment & Plan Note (Signed)
Continues to utilize oxygen 3-1/2 L at night and when napping Patient benefits from oxygen use She needs oxygen in order to survive  Plan: We will coordinate walking [place] office when patient obtains pulmonary function testing We will order pulmonary function test to further evaluate restriction

## 2019-10-09 NOTE — Telephone Encounter (Signed)
LMTCB x2  

## 2019-10-10 ENCOUNTER — Other Ambulatory Visit: Payer: Self-pay | Admitting: *Deleted

## 2019-10-10 ENCOUNTER — Telehealth: Payer: Self-pay | Admitting: *Deleted

## 2019-10-10 DIAGNOSIS — Z87891 Personal history of nicotine dependence: Secondary | ICD-10-CM

## 2019-10-10 NOTE — Telephone Encounter (Signed)
If her insurance refuses to cover this, we may be able to use a grant CT. But we need to go through her insurance first . Hopefully they will do the right thing. If not, we can use the refusal to show under-insured for the scan which would hopefully meet grant compliance.  Please let me know what happens with her insurance so that I can look into how many grant CT's we have left if she is denied . She would need screening before the end of May.

## 2019-10-10 NOTE — Telephone Encounter (Signed)
Thank you [name] for the input, I will keep everyone in the loop in our attempts to obtain authorization.

## 2019-10-10 NOTE — Telephone Encounter (Signed)
Received referral for initial lung cancer screening scan. Contacted patient and obtained smoking history,(current, 72 pack year) as well as answering questions related to screening process. Patient denies signs of lung cancer such as weight loss or hemoptysis. Patient denies comorbidity that would prevent curative treatment if lung cancer were found. Patient meets criteria for lung screening with USPSTF recommendation. Will attempt to obtain insurance authorization and schedule shared decision making visit as well as CT screening scan.

## 2019-10-10 NOTE — Telephone Encounter (Signed)
Thank you Dyanne Carrel

## 2019-10-10 NOTE — Telephone Encounter (Signed)
[place] noted. Could patient be considered for Fatima Sanger coverage?  Lo[place] SG NP in on the conversation to see if we can coordinate.  Thanks for contacting the pt.  Wyn Quaker FNP

## 2019-10-10 NOTE — Telephone Encounter (Signed)
Contacted patient regarding lung screening scan. Discussed that with new recommendations for screening patient does meet criteria. However, insurance has not been covering lung screening for patients < 51 years old yet. We will attempt to obtain authorization with her insurance but patient verbalizes that she does not want to have scan if insurance does not approve of screening scan. If her insurance does indeed deny lung screening at this time she will be kept in a database and contacted once insurance companies begin covering for the updated recommendations.

## 2019-10-10 NOTE — Telephone Encounter (Signed)
Thank you   [name]  

## 2019-10-14 ENCOUNTER — Other Ambulatory Visit: Payer: BC Managed Care – PPO

## 2019-10-14 ENCOUNTER — Encounter: Payer: Self-pay | Admitting: *Deleted

## 2019-10-14 NOTE — Telephone Encounter (Signed)
LMTCB x 3  Will send a letter

## 2019-10-17 DIAGNOSIS — J449 Chronic obstructive pulmonary disease, unspecified: Secondary | ICD-10-CM | POA: Diagnosis not present

## 2019-10-18 ENCOUNTER — Ambulatory Visit: Payer: BC Managed Care – PPO

## 2019-10-22 ENCOUNTER — Ambulatory Visit: Payer: BC Managed Care – PPO

## 2019-10-23 ENCOUNTER — Emergency Department
Admission: EM | Admit: 2019-10-23 | Discharge: 2019-10-23 | Disposition: A | Discharge status: Home / Self Care | Payer: BC Managed Care – PPO | Attending: Emergency Medicine | Admitting: Emergency Medicine

## 2019-10-23 ENCOUNTER — Other Ambulatory Visit: Payer: Self-pay

## 2019-10-23 ENCOUNTER — Other Ambulatory Visit: Payer: BC Managed Care – PPO

## 2019-10-23 ENCOUNTER — Inpatient Hospital Stay: Admission: RE | Admit: 2019-10-23 | Payer: BC Managed Care – PPO | Source: Ambulatory Visit

## 2019-10-23 DIAGNOSIS — E1165 Type 2 diabetes mellitus with hyperglycemia: Secondary | ICD-10-CM | POA: Insufficient documentation

## 2019-10-23 DIAGNOSIS — J449 Chronic obstructive pulmonary disease, unspecified: Secondary | ICD-10-CM | POA: Insufficient documentation

## 2019-10-23 DIAGNOSIS — E161 Other hypoglycemia: Secondary | ICD-10-CM | POA: Diagnosis not present

## 2019-10-23 DIAGNOSIS — R739 Hyperglycemia, unspecified: Secondary | ICD-10-CM

## 2019-10-23 DIAGNOSIS — I4510 Unspecified right bundle-branch block: Secondary | ICD-10-CM | POA: Diagnosis not present

## 2019-10-23 DIAGNOSIS — N182 Chronic kidney disease, stage 2 (mild): Secondary | ICD-10-CM | POA: Diagnosis not present

## 2019-10-23 DIAGNOSIS — R531 Weakness: Secondary | ICD-10-CM | POA: Insufficient documentation

## 2019-10-23 DIAGNOSIS — Z794 Long term (current) use of insulin: Secondary | ICD-10-CM | POA: Diagnosis not present

## 2019-10-23 DIAGNOSIS — I10 Essential (primary) hypertension: Secondary | ICD-10-CM | POA: Diagnosis not present

## 2019-10-23 DIAGNOSIS — E162 Hypoglycemia, unspecified: Secondary | ICD-10-CM | POA: Diagnosis not present

## 2019-10-23 DIAGNOSIS — I509 Heart failure, unspecified: Secondary | ICD-10-CM | POA: Insufficient documentation

## 2019-10-23 DIAGNOSIS — Z79899 Other long term (current) drug therapy: Secondary | ICD-10-CM | POA: Insufficient documentation

## 2019-10-23 DIAGNOSIS — F17210 Nicotine dependence, cigarettes, uncomplicated: Secondary | ICD-10-CM | POA: Diagnosis not present

## 2019-10-23 DIAGNOSIS — I2510 Atherosclerotic heart disease of native coronary artery without angina pectoris: Secondary | ICD-10-CM | POA: Insufficient documentation

## 2019-10-23 DIAGNOSIS — T68XXXA Hypothermia, initial encounter: Secondary | ICD-10-CM | POA: Diagnosis not present

## 2019-10-23 DIAGNOSIS — Z7982 Long term (current) use of aspirin: Secondary | ICD-10-CM | POA: Insufficient documentation

## 2019-10-23 DIAGNOSIS — I130 Hypertensive heart and chronic kidney disease with heart failure and stage 1 through stage 4 chronic kidney disease, or unspecified chronic kidney disease: Secondary | ICD-10-CM | POA: Diagnosis not present

## 2019-10-23 LAB — CBC
HCT: 43.9 % (ref 36.0–46.0)
Hemoglobin: 14.3 g/dL (ref 12.0–15.0)
MCH: 28.6 pg (ref 26.0–34.0)
MCHC: 32.6 g/dL (ref 30.0–36.0)
MCV: 87.8 fL (ref 80.0–100.0)
Platelets: 219 10*3/uL (ref 150–400)
RBC: 5 MIL/uL (ref 3.87–5.11)
RDW: 15.6 % — ABNORMAL HIGH (ref 11.5–15.5)
WBC: 10.2 10*3/uL (ref 4.0–10.5)
nRBC: 0 % (ref 0.0–0.2)

## 2019-10-23 LAB — URINALYSIS, COMPLETE (UACMP) WITH MICROSCOPIC
Bilirubin Urine: NEGATIVE
Glucose, UA: >=500 mg/dL — AB
Ketones, ur: NEGATIVE mg/dL
Leukocytes,Ua: NEGATIVE
Nitrite: NEGATIVE
Protein, ur: NEGATIVE mg/dL
Specific Gravity, Urine: 1.005 (ref 1.005–1.030)
pH: 6 (ref 5.0–8.0)

## 2019-10-23 LAB — GLUCOSE, CAPILLARY
Glucose-Capillary: 323 mg/dL — ABNORMAL HIGH (ref 70–99)
Glucose-Capillary: 272 mg/dL — ABNORMAL HIGH (ref 70–99)

## 2019-10-23 LAB — BASIC METABOLIC PANEL
Anion gap: 13 (ref 5–15)
BUN: 52 mg/dL — ABNORMAL HIGH (ref 6–20)
CO2: 32 mmol/L (ref 22–32)
Calcium: 9.5 mg/dL (ref 8.9–10.3)
Chloride: 87 mmol/L — ABNORMAL LOW (ref 98–111)
Creatinine, Ser: 1.62 mg/dL — ABNORMAL HIGH (ref 0.44–1.00)
GFR calc Af Amer: 42 mL/min — ABNORMAL LOW (ref >60)
GFR calc non Af Amer: 37 mL/min — ABNORMAL LOW (ref >60)
Glucose, Bld: 401 mg/dL — ABNORMAL HIGH (ref 70–99)
Potassium: 3.5 mmol/L (ref 3.5–5.1)
Sodium: 132 mmol/L — ABNORMAL LOW (ref 135–145)

## 2019-10-23 MED ORDER — SODIUM CHLORIDE 0.9 % IV BOLUS
1000.0000 mL | Freq: Once | INTRAVENOUS | Status: AC
  Administered 2019-10-23: 1000 mL via INTRAVENOUS

## 2019-10-23 MED ORDER — INSULIN ASPART 100 UNIT/ML ~~LOC~~ SOLN
6.0000 [IU] | Freq: Once | SUBCUTANEOUS | Status: AC
  Administered 2019-10-23: 6 [IU] via INTRAVENOUS
  Filled 2019-10-23: qty 1

## 2019-10-23 MED ORDER — INSULIN ASPART 100 UNIT/ML ~~LOC~~ SOLN
10.0000 [IU] | Freq: Once | SUBCUTANEOUS | Status: AC
  Administered 2019-10-23: 19:00:00 10 [IU] via INTRAVENOUS
  Filled 2019-10-23: qty 1

## 2019-10-23 NOTE — ED Provider Notes (Signed)
[facility] Emergency Department Provider Note  Time seen: 6:02 PM  I have reviewed the triage vital signs and the nursing notes.   HISTORY  Chief Complaint Weakness and Fatigue   HPI [name] is a 49 y.o. female with a past medical history of anxiety, asthma, CHF, CAD, COPD, diabetes, hypertension, hyperlipidemia, obesity, presents to the emergency department for generalized fatigue/weakness.  According to the patient over the past several weeks she has been feeling weak and fatigued.  Patient was concerned that she could be anemic so she came to the emergency department for evaluation.  Patient denies any chest pain abdominal pain vomiting or diarrhea.  Patient does admit that her blood sugars have been running in the 3 and 400s at home.  Has noted increased urinary frequency as well.  Patient states she has been taking her medications.   Past Medical History:  Diagnosis Date  . Anxiety    takes Prozac daily  . Anxiety   . Aortic valve calcification   . Asthma    Advair and Spirva daily  . Asthma   . Bipolar disorder ([place])   . CAD (coronary artery disease)    a. LHC 11/2013 done for CP/fluid retention: mild disease in prox LAD, mild-mod disease in mRCA, EF 60% with normal LVEDP. b. Normal nuc 03/2016.  Marland Kitchen CHF (congestive heart failure) ([place])   . Chronic diastolic CHF (congestive heart failure) ([place])   . Chronic heart failure with preserved ejection fraction ([place]) 11/16/2018  . CKD (chronic kidney disease), stage II   . COPD (chronic obstructive pulmonary disease) (HCC)    a. nocturnal O2.  Marland Kitchen COPD (chronic obstructive pulmonary disease) ([place])   . Coronary artery disease   . Decreased urine stream   . Diabetes mellitus   . Diabetes mellitus without complication ([place])   . Dyspnea   . Family history of adverse reaction to anesthesia    mom gets nauseated  . GERD (gastroesophageal reflux disease)    takes Pepcid daily  . History of blood clots    left  leg 3-30yrs ago  . Hyperlipidemia   . Hypertension   . Hypertriglyceridemia   . Inguinal hernia, left 01/2015  . Muscle spasm   . Open wound of genital labia   . Peripheral neuropathy   . RBBB   . Seizures ([place])   . Sepsis ([place]) 01/19/2018  . Sinus tachycardia    a. persistent since 2009.  Marland Kitchen Smokers' cough ([place])   . Stroke [facility]) 1989   left sided weakness  . TIA (transient ischemic attack)   . Tobacco abuse   . Vulvar abscess 01/23/2018    Patient Active Problem List   Diagnosis Date Noted  . Restrictive lung disease 10/08/2019  . Breast mass 10/07/2019  . Postmenopausal bleeding 08/30/2019  . Carotid stenosis, asymptomatic, right 08/16/2019  . Nausea and vomiting 07/15/2019  . Cellulitis of left lower extremity 06/04/2019  . Multiple wounds of skin 06/04/2019  . Left-sided weakness 09/13/2018  . Weakness of left lower extremity 09/05/2018  . Recurrent falls 09/05/2018  . PAD (peripheral artery disease) ([place]) 08/27/2018  . Chronic congestive heart failure ([place]) 08/02/2018  . Vaginal itching 06/15/2018  . Foot pain, bilateral 05/22/2018  . Chronic upper extremity pain (Secondary Area of Pain) (Bilateral) 04/23/2018  . Diabetic polyneuropathy associated with type 1 diabetes mellitus ([place]) 04/23/2018  . Long term prescription benzodiazepine use 04/23/2018  . Neurogenic pain 04/23/2018  . Vitamin D insufficiency 01/29/2018  .  Elevated C-reactive protein (CRP) 01/29/2018  . Elevated sedimentation rate 01/29/2018  . Pressure injury of skin 01/29/2018  . Poorly controlled type 2 diabetes mellitus ([place]) 01/23/2018  . DNR (do not resuscitate) 01/23/2018  . IBS (irritable bowel syndrome) 01/19/2018  . Elevated serum hCG 01/19/2018  . Chronic lower extremity pain (Primary Area of Pain) (Bilateral) 01/08/2018  . Chronic low back pain [facility] Area of Pain) (Bilateral) w/ sciatica (Bilateral) 01/08/2018  . Chronic pain syndrome 01/08/2018  . Pharmacologic therapy 01/08/2018  .  Disorder of skeletal system 01/08/2018  . Problems influencing health status 01/08/2018  . Long term current use of opiate analgesic 01/08/2018  . Restless leg syndrome 08/02/2017  . Nocturnal hypoxemia 03/29/2017  . Vision disturbance 02/28/2017  . CKD (chronic kidney disease), stage II 02/28/2017  . Visual loss, bilateral   . Chronic respiratory failure with hypoxia (HCC)/ nocturnal 02 dep  10/28/2016  . Upper airway cough syndrome 08/22/2016  . Cigarette smoker 06/15/2016  . Simple chronic bronchitis ([place])   . Coronary artery disease involving native heart without angina pectoris 05/16/2016  . Acute on chronic diastolic (congestive) heart failure ([place]) 11/04/2015  . Orthopnea 11/03/2015  . Bilateral leg edema   . Diabetes ([place])   . COPD exacerbation ([place]) 10/15/2015  . Fracture of rib, closed 10/15/2015  . Venous stasis of both lower extremities 03/29/2015  . Preventative health care 02/04/2015  . Essential hypertension 01/02/2015  . Inguinal hernia 11/27/2014  . Allergic rhinitis with postnasal drip 01/21/2014  . Aortic valve disorders 04/18/2013  . Sleep apnea 05/22/2012  . Seizure disorder ([place]) 10/09/2011  . Bipolar disorder ([place]) 10/09/2011  . TIA (transient ischemic attack) 06/22/2011  . Constipation 06/15/2011  . HYPERTRIGLYCERIDEMIA 07/17/2007  . Situational anxiety 05/30/2007  . Asthma-COPD overlap syndrome ([place]) 05/30/2007  . Morbid (severe) obesity due to excess calories ([place]) 04/23/2007  . Tobacco abuse 09/07/2006    Past Surgical History:  Procedure Laterality Date  . HERNIA REPAIR    . INCISION AND DRAINAGE ABSCESS N/A 01/26/2018   Procedure: INCISION AND DEBRIDEMENT OF VULVAR NECROTIZING SOFT TISSUE INFECTION;  Surgeon: Greer Pickerel, MD;  Location: [place];  Service: General;  Laterality: N/A;  . INCISION AND DRAINAGE PERIRECTAL ABSCESS N/A 01/22/2018   Procedure: IRRIGATION AND DEBRIDEMENT LABIAL/VULVAR AREA;  Surgeon: Coralie Keens, MD;  Location: [place];   Service: General;  Laterality: N/A;  . INCISION AND DRAINAGE PERIRECTAL ABSCESS N/A 01/29/2018   Procedure: IRRIGATION AND DEBRIDEMENT VULVA;  Surgeon: Excell Seltzer, MD;  Location: [place] [place];  Service: General;  Laterality: N/A;  . INGUINAL HERNIA REPAIR Left 04/08/2015   Procedure: OPEN LEFT INGUINAL HERNIA REPAIR WITH MESH;  Surgeon: Ralene Ok, MD;  Location: [place];  Service: General;  Laterality: Left;  . INSERTION OF MESH Left 04/08/2015   Procedure: INSERTION OF MESH;  Surgeon: Ralene Ok, MD;  Location: [place];  Service: General;  Laterality: Left;  . LAPAROSCOPY     Endometriosis  . LEFT HEART CATHETERIZATION WITH CORONARY ANGIOGRAM N/A 12/05/2013   Procedure: LEFT HEART CATHETERIZATION WITH CORONARY ANGIOGRAM;  Surgeon: Jettie Booze, MD;  Location: [facility] CATH LAB;  Service: Cardiovascular;  Laterality: N/A;  . right kidney drained    . TEE WITHOUT CARDIOVERSION N/A 11/28/2013   Procedure: TRANSESOPHAGEAL ECHOCARDIOGRAM (TEE);  Surgeon: Thayer Headings, MD;  Location: [place];  Service: Cardiovascular;  Laterality: N/A;    Prior to Admission medications   Medication Sig Start Date End Date Taking? Authorizing Provider  acetaminophen (TYLENOL)  500 MG tablet Take 500 mg by mouth every 4 (four) hours as needed for moderate pain.     [provider]  ALPRAZolam Duanne Moron) 0.5 MG tablet Take 1 tablet (0.5 mg total) by mouth 2 (two) times daily as needed for anxiety. Patient taking differently: Take 1 mg by mouth daily.  09/14/18   Geradine Girt, DO  aspirin EC 81 MG tablet Take 81 mg by mouth every morning.    [provider]  budesonide-formoterol (SYMBICORT) 80-4.5 MCG/ACT inhaler INHALE 2 PUFFS BY MOUTH EVERY 12 HOURS TO PREVENT COUGH OR WHEEZING *RINSE MOUTH AFTER EACH USE* 10/08/19   Lauraine Rinne, NP  busPIRone (BUSPAR) 30 MG tablet Take 30 mg by mouth 2 (two) times daily.    [provider]  famotidine (PEPCID) 20 MG tablet TAKE 1 TABLET (20 MG  TOTAL) BY MOUTH 2 (TWO) TIMES DAILY. FOR HEARTBURN. 06/05/19   Pleas Koch, NP  FLUoxetine (PROZAC) 40 MG capsule Take 40 mg by mouth at bedtime.  07/25/18   [provider]  glucose blood (ONETOUCH VERIO) test strip 1 each by Other route 3 (three) times daily. E11.9; E10.42 09/09/19   Renato Shin, MD  Insulin Pen Needle 32G X 4 MM MISC Used to give insulin injections twice daily. 08/23/17   Renato Shin, MD  insulin regular human CONCENTRATED (HUMULIN R) 500 UNIT/ML injection Inject 0.8 mLs (400 Units total) into the skin 2 (two) times daily with a meal. DOSAGE IS CORRECT. PLEASE FILL AS PRESCRIBED 08/19/19   Renato Shin, MD  Insulin Syringe-Needle U-100 (INSULIN SYRINGE 1CC/30GX1/2") 30G X 1/2" 1 ML MISC 1 each by Does not apply route 2 (two) times daily. E11.9 08/19/19   Renato Shin, MD  ipratropium-albuterol (DUONEB) 0.5-2.5 (3) MG/3ML SOLN Take 3 mLs by nebulization every 4 (four) hours as needed (wheezing/shortness of breath). 08/02/17   Pleas Koch, NP  OneTouch Delica Lancets [id number] MISC 1 each by Does not apply route 3 (three) times daily. Use to monitor glucose levels three times daily; E11.9; E10.42 09/26/18   Renato Shin, MD  OXYGEN Inhale 4 L into the lungs at bedtime.     [provider]  potassium chloride 20 MEQ TBCR Take 20 mEq by mouth daily. 07/09/19   Patwardhan, Reynold Bowen, MD  pregabalin (LYRICA) 300 MG capsule Take 1 capsule (300 mg total) by mouth 2 (two) times daily. For neuropathy. 09/24/19   Pleas Koch, NP  rOPINIRole (REQUIP) 1 MG tablet TAKE 1 TABLET BY MOUTH EVERY NIGHT AT BEDTIME FOR RESTLESS LEGS. 09/24/19   Pleas Koch, NP  rosuvastatin (CRESTOR) 10 MG tablet Take 1 tablet (10 mg total) by mouth daily. 08/16/19 11/14/19  Patwardhan, Reynold Bowen, MD  Semaglutide,0.25 or 0.5MG /DOS, (OZEMPIC, 0.25 OR 0.5 MG/DOSE,) 2 MG/1.5ML SOPN Inject 0.25 mg into the skin once a week. 08/12/19   Renato Shin, MD  spironolactone (ALDACTONE) 50 MG tablet  TAKE 1 TABLET BY MOUTH ONCE A DAY 10/03/19   Patwardhan, Manish J, MD  tobramycin (TOBREX) 0.3 % ophthalmic solution Place 1 drop into both eyes daily.  10/26/18   [provider]  torsemide (DEMADEX) 20 MG tablet TAKE 1 TABLET BY MOUTH 4 TIMES DAILY Patient taking differently: 40 mg 2 (two) times daily.  07/10/19   Patwardhan, Reynold Bowen, MD  traZODone (DESYREL) 100 MG tablet Take 100 mg by mouth daily.  11/08/18   [provider]  ULTICARE MINI PEN NEEDLES 31G X 6 MM MISC USE  AS DIRECTED THREE TIMES DAILY 07/25/18   Renato Shin, MD    Allergies  Allergen Reactions  . Adhesive [Tape] Rash and Other (See Comments)    TAKES OFF THE SKIN (CERTAIN MEDICAL TAPES DO THIS!!)  . Metoprolol Shortness Of Breath    Occurrence of shortness of breath after 3 days  . Montelukast Shortness Of Breath  . Morphine Sulfate Anaphylaxis, Shortness Of Breath and Nausea And Vomiting    Swollen Throat - Able to tolerate dilaudid  . Penicillins Anaphylaxis, Hives and Shortness Of Breath    Throat swells Has patient had a PCN reaction causing immediate rash, facial/tongue/throat swelling, SOB or lightheadedness with hypotension: Yes Has patient had a PCN reaction causing severe rash involving mucus membranes or skin necrosis: No Has patient had a PCN reaction that required hospitalization: Yes Has patient had a PCN reaction occurring within the last 10 years: No If all of the above answers are "NO", then may proceed with Cephalosporin use.   . Prednisone Anaphylaxis  . Diltiazem Swelling  . Gabapentin Swelling    Family History  Problem Relation Age of Onset  . Venous thrombosis Brother   . Other Brother        BRAIN TUMOR  . Asthma Father   . Diabetes Father   . Coronary artery disease Mother   . Hypertension Mother   . Diabetes Mother   . Asthma Sister   . Diabetes type II Brother     Social History Social History   Tobacco Use  . Smoking status: Current Some Day Smoker     Packs/day: 2.00    Years: 36.00    Pack years: 72.00    Types: Cigarettes    Start date: 07/11/1981  . Smokeless tobacco: Never Used  . Tobacco comment: 1 ppd now  Substance Use Topics  . Alcohol use: No  . Drug use: No    Review of Systems Constitutional: Negative for fever. Cardiovascular: Negative for chest pain. Respiratory: Negative for shortness of breath. Gastrointestinal: Negative for abdominal pain, vomiting Genitourinary: Urinary frequency without dysuria Musculoskeletal: Negative for musculoskeletal complaints Neurological: Negative for headache All other ROS negative  ____________________________________________   PHYSICAL EXAM:  VITAL SIGNS: ED Triage Vitals  Enc Vitals Group     BP 10/23/19 1605 121/63     Pulse Rate 10/23/19 1605 81     Resp 10/23/19 1605 18     Temp 10/23/19 1605 97.7 F (36.5 C)     Temp Source 10/23/19 1605 Oral     SpO2 10/23/19 1605 93 %     Weight 10/23/19 1606 282 lb 3 oz (128 kg)     Height 10/23/19 1606 5\' 6"  (1.676 m)     Head Circumference --      Peak Flow --      Pain Score 10/23/19 1606 0     Pain Loc --      Pain Edu? --      Excl. in [place]? --     Constitutional: Alert and oriented. Well appearing and in no distress. Eyes: Normal exam ENT      Head: Normocephalic and atraumatic.      Mouth/Throat: Mucous membranes are moist. Cardiovascular: Normal rate, regular rhythm.  Respiratory: Normal respiratory effort without tachypnea nor retractions. Breath sounds are clear  Gastrointestinal: Soft and nontender. No distention.  Obese Musculoskeletal: Nontender with normal range of motion in all extremities.  1+ lower extreme edema equal bilaterally.  No erythema or signs of infection.  Neurologic:  Normal speech and language. No gross focal neurologic deficits  Skin:  Skin is warm, dry and intact.  Psychiatric: Mood and affect are normal.  ____________________________________________    EKG  EKG viewed and interpreted  by myself shows a sinus rhythm 83 bpm with a slightly widened QRS, left axis deviation, largely normal intervals besides slight QTC prolongation 517 ms with nonspecific ST changes.  ____________________________________________   INITIAL IMPRESSION / ASSESSMENT AND PLAN / ED COURSE  Pertinent labs & imaging results that were available during my care of the patient were reviewed by me and considered in my medical decision making (see chart for details).   Patient presents to the emergency department generalized fatigue/weakness.  Patient is noted to be hyperglycemic currently 401.  States she has been hyperglycemic at home over the past few weeks 300 400 level.  I discussed with the patient the importance of adhering to a low carbohydrate diet and drinking plenty of nonsugary fluids as well as taking her medications as prescribed.  Patient understands and is agreeable to this plan of care.  However given her anion gap of 13 and blood glucose of 401 we will IV hydrate with 1 L of normal saline in the emergency department.  We will dose 10 units of IV insulin and reassess.  The remainder of the patient's lab work is overall reassuring.  Patient is feeling much better.  She is asking to be discharged home.  Patient's blood sugar has come down with fluids and insulin.  Patient did briefly desatted to the 80s while sleeping however she has oxygen to use at home as needed.  Denies any dyspnea.  We will discharge the patient home with PCP follow-up.  I discussed again diabetic diet control and diabetic management as well as oral hydration.  Patient agreeable plan of care.  Tanetta Dejaynes was evaluated in Emergency Department on 10/23/2019 for the symptoms described in the history of present illness. She was evaluated in the context of the global COVID-19 pandemic, which necessitated consideration that the patient might be at risk for infection with the SARS-CoV-2 virus that causes COVID-19. Institutional protocols  and algorithms that pertain to the evaluation of patients at risk for COVID-19 are in a state of rapid change based on information released by regulatory bodies including the CDC and federal and state organizations. These policies and algorithms were followed during the patient's care in the ED.  ____________________________________________   FINAL CLINICAL IMPRESSION(S) / ED DIAGNOSES  Weakness Hyperglycemia   Harvest Dark, MD 10/23/19 2125

## 2019-10-23 NOTE — ED Triage Notes (Addendum)
FIRST NURSE NOTE- came [facility], general weakness 1 week. VSS with EMS. CBG 435 with EMS. Normal blood sugar 300-400.  Wears O2 at night per EMS.  sats 75 RA with EMS so they put on [place]

## 2019-10-23 NOTE — ED Notes (Addendum)
Pt st she is feeling better than she did when she first came into the ER. MD notified

## 2019-10-23 NOTE — ED Triage Notes (Signed)
Pt states that she has been increasingly tired over the past week and her legs feel like noodles. See first nurse note.

## 2019-10-28 ENCOUNTER — Ambulatory Visit: Payer: BC Managed Care – PPO

## 2019-10-30 ENCOUNTER — Telehealth: Payer: Self-pay

## 2019-10-30 ENCOUNTER — Ambulatory Visit: Payer: Medicare HMO | Admitting: Primary Care

## 2019-10-30 NOTE — Telephone Encounter (Signed)
Was not aware that a prior auth was needed

## 2019-10-30 NOTE — Telephone Encounter (Signed)
[place] with [facility] clinical pharmacy review left v/m requesting cb for more info for PA for pregabalin 300 mg. Item # [id number].

## 2019-11-01 DIAGNOSIS — F3342 Major depressive disorder, recurrent, in full remission: Secondary | ICD-10-CM | POA: Diagnosis not present

## 2019-11-01 DIAGNOSIS — F410 Panic disorder [episodic paroxysmal anxiety] without agoraphobia: Secondary | ICD-10-CM | POA: Diagnosis not present

## 2019-11-04 ENCOUNTER — Ambulatory Visit: Payer: BC Managed Care – PPO | Admitting: Podiatry

## 2019-11-05 ENCOUNTER — Ambulatory Visit
Admission: RE | Admit: 2019-11-05 | Discharge: 2019-11-05 | Disposition: A | Discharge status: Home / Self Care | Payer: BC Managed Care – PPO | Source: Ambulatory Visit | Attending: Primary Care | Admitting: Primary Care

## 2019-11-05 ENCOUNTER — Encounter: Payer: Self-pay | Admitting: Radiology

## 2019-11-05 ENCOUNTER — Other Ambulatory Visit: Payer: Self-pay | Admitting: Primary Care

## 2019-11-05 DIAGNOSIS — R928 Other abnormal and inconclusive findings on diagnostic imaging of breast: Secondary | ICD-10-CM

## 2019-11-05 DIAGNOSIS — N6310 Unspecified lump in the right breast, unspecified quadrant: Secondary | ICD-10-CM

## 2019-11-05 DIAGNOSIS — N63 Unspecified lump in unspecified breast: Secondary | ICD-10-CM | POA: Diagnosis not present

## 2019-11-05 DIAGNOSIS — R921 Mammographic calcification found on diagnostic imaging of breast: Secondary | ICD-10-CM | POA: Insufficient documentation

## 2019-11-05 DIAGNOSIS — N6489 Other specified disorders of breast: Secondary | ICD-10-CM | POA: Diagnosis not present

## 2019-11-05 DIAGNOSIS — N6313 Unspecified lump in the right breast, lower outer quadrant: Secondary | ICD-10-CM | POA: Diagnosis not present

## 2019-11-06 ENCOUNTER — Ambulatory Visit
Admission: RE | Admit: 2019-11-06 | Discharge: 2019-11-06 | Disposition: A | Discharge status: Home / Self Care | Payer: BC Managed Care – PPO | Source: Ambulatory Visit | Attending: Primary Care | Admitting: Primary Care

## 2019-11-06 ENCOUNTER — Telehealth: Payer: Self-pay | Admitting: Primary Care

## 2019-11-06 ENCOUNTER — Ambulatory Visit: Payer: Medicare HMO | Admitting: Primary Care

## 2019-11-06 DIAGNOSIS — C50511 Malignant neoplasm of lower-outer quadrant of right female breast: Secondary | ICD-10-CM | POA: Diagnosis not present

## 2019-11-06 DIAGNOSIS — N6310 Unspecified lump in the right breast, unspecified quadrant: Secondary | ICD-10-CM | POA: Insufficient documentation

## 2019-11-06 DIAGNOSIS — R928 Other abnormal and inconclusive findings on diagnostic imaging of breast: Secondary | ICD-10-CM

## 2019-11-06 DIAGNOSIS — N6313 Unspecified lump in the right breast, lower outer quadrant: Secondary | ICD-10-CM | POA: Diagnosis not present

## 2019-11-06 HISTORY — PX: BREAST BIOPSY: SHX20

## 2019-11-06 NOTE — Telephone Encounter (Signed)
Patient called to cancel her appointment for today.  Patient's husband couldn't find his car keys.  I let patient know that she would be called back to reschedule appointment.  Patient said she has a biopsy scheduled for this afternoon.

## 2019-11-06 NOTE — Telephone Encounter (Signed)
Please notify patient not to worry, we can follow up later in the future. I see that she is undergoing evaluation of a breast mass. Have her update me if anything changes, otherwise we will catch up at a more convenient time.

## 2019-11-06 NOTE — Telephone Encounter (Signed)
Prior Auth have completed on 11/01/2019. Forgot to input in system

## 2019-11-06 NOTE — Telephone Encounter (Signed)
Spoke to patient's husband and he stated that patient really wanted to see Anda Kraft. He will check with patient and I did place a hold on Friday for patient.

## 2019-11-07 ENCOUNTER — Other Ambulatory Visit: Payer: Self-pay | Admitting: Anatomic Pathology & Clinical Pathology

## 2019-11-07 DIAGNOSIS — C50919 Malignant neoplasm of unspecified site of unspecified female breast: Secondary | ICD-10-CM

## 2019-11-07 NOTE — Progress Notes (Signed)
Phoned patient to initiate navigation.  Patient and husband anxious to begin treatment.  Referrals to Med/Onc and surgery will be completed tomorrow.

## 2019-11-07 NOTE — Telephone Encounter (Signed)
Patient just found out she has breast cancer and want to schedule an appointment to see Anda Kraft sometime next week. Inform patient to give [place] a call when she is ready

## 2019-11-08 ENCOUNTER — Other Ambulatory Visit
Admission: RE | Admit: 2019-11-08 | Discharge: 2019-11-08 | Disposition: A | Discharge status: Home / Self Care | Payer: BC Managed Care – PPO | Source: Ambulatory Visit | Attending: Rehabilitative and Restorative Service Providers" | Admitting: Rehabilitative and Restorative Service Providers"

## 2019-11-08 DIAGNOSIS — I509 Heart failure, unspecified: Secondary | ICD-10-CM | POA: Diagnosis not present

## 2019-11-08 DIAGNOSIS — I10 Essential (primary) hypertension: Secondary | ICD-10-CM | POA: Diagnosis not present

## 2019-11-08 DIAGNOSIS — I5033 Acute on chronic diastolic (congestive) heart failure: Secondary | ICD-10-CM | POA: Diagnosis not present

## 2019-11-08 DIAGNOSIS — I2510 Atherosclerotic heart disease of native coronary artery without angina pectoris: Secondary | ICD-10-CM | POA: Diagnosis not present

## 2019-11-08 DIAGNOSIS — R0602 Shortness of breath: Secondary | ICD-10-CM | POA: Diagnosis not present

## 2019-11-08 DIAGNOSIS — I6521 Occlusion and stenosis of right carotid artery: Secondary | ICD-10-CM | POA: Diagnosis not present

## 2019-11-08 LAB — BRAIN NATRIURETIC PEPTIDE: B Natriuretic Peptide: 59 pg/mL (ref 0.0–100.0)

## 2019-11-10 NOTE — Progress Notes (Signed)
Spoke to patient and husband and reviewed upcoming appointments, and patient concerns.

## 2019-11-11 ENCOUNTER — Telehealth: Payer: Self-pay | Admitting: Primary Care

## 2019-11-11 LAB — SURGICAL PATHOLOGY

## 2019-11-11 NOTE — Telephone Encounter (Signed)
Patient is requesting a call back  She stated she is returning a call from you

## 2019-11-11 NOTE — Telephone Encounter (Signed)
Spoken to patient earlier today. Patient wanted to let us know that she now has fluid and is on medication from cardiology. Patient wanted to know when is the best time to schedule with Anda Kraft. I have inform patient that it is patient's decision when she wants to see Anda Kraft, patient wants to wait after she see oncology.

## 2019-11-12 ENCOUNTER — Other Ambulatory Visit: Payer: Self-pay

## 2019-11-12 ENCOUNTER — Encounter: Payer: Self-pay | Admitting: Internal Medicine

## 2019-11-12 ENCOUNTER — Ambulatory Visit (INDEPENDENT_AMBULATORY_CARE_PROVIDER_SITE_OTHER): Payer: BC Managed Care – PPO | Admitting: Surgery

## 2019-11-12 ENCOUNTER — Inpatient Hospital Stay: Payer: BC Managed Care – PPO | Attending: Internal Medicine | Admitting: Internal Medicine

## 2019-11-12 ENCOUNTER — Other Ambulatory Visit: Payer: Self-pay | Admitting: Surgery

## 2019-11-12 ENCOUNTER — Inpatient Hospital Stay: Payer: BC Managed Care – PPO

## 2019-11-12 ENCOUNTER — Encounter: Payer: Self-pay | Admitting: Surgery

## 2019-11-12 ENCOUNTER — Ambulatory Visit: Payer: Self-pay | Admitting: Surgery

## 2019-11-12 VITALS — BP 123/90 | HR 78 | Temp 97.7°F | Resp 16 | Ht 64.0 in | Wt 289.0 lb

## 2019-11-12 DIAGNOSIS — Z86718 Personal history of other venous thrombosis and embolism: Secondary | ICD-10-CM | POA: Diagnosis not present

## 2019-11-12 DIAGNOSIS — F419 Anxiety disorder, unspecified: Secondary | ICD-10-CM | POA: Insufficient documentation

## 2019-11-12 DIAGNOSIS — C50511 Malignant neoplasm of lower-outer quadrant of right female breast: Secondary | ICD-10-CM | POA: Diagnosis not present

## 2019-11-12 DIAGNOSIS — F319 Bipolar disorder, unspecified: Secondary | ICD-10-CM | POA: Insufficient documentation

## 2019-11-12 DIAGNOSIS — E785 Hyperlipidemia, unspecified: Secondary | ICD-10-CM | POA: Insufficient documentation

## 2019-11-12 DIAGNOSIS — J449 Chronic obstructive pulmonary disease, unspecified: Secondary | ICD-10-CM | POA: Insufficient documentation

## 2019-11-12 DIAGNOSIS — Z794 Long term (current) use of insulin: Secondary | ICD-10-CM | POA: Diagnosis not present

## 2019-11-12 DIAGNOSIS — Z170 Estrogen receptor positive status [ER+]: Secondary | ICD-10-CM

## 2019-11-12 DIAGNOSIS — I130 Hypertensive heart and chronic kidney disease with heart failure and stage 1 through stage 4 chronic kidney disease, or unspecified chronic kidney disease: Secondary | ICD-10-CM | POA: Diagnosis not present

## 2019-11-12 DIAGNOSIS — E1165 Type 2 diabetes mellitus with hyperglycemia: Secondary | ICD-10-CM | POA: Insufficient documentation

## 2019-11-12 DIAGNOSIS — N182 Chronic kidney disease, stage 2 (mild): Secondary | ICD-10-CM | POA: Insufficient documentation

## 2019-11-12 DIAGNOSIS — E1122 Type 2 diabetes mellitus with diabetic chronic kidney disease: Secondary | ICD-10-CM | POA: Diagnosis not present

## 2019-11-12 DIAGNOSIS — Z79899 Other long term (current) drug therapy: Secondary | ICD-10-CM | POA: Diagnosis not present

## 2019-11-12 DIAGNOSIS — Z8673 Personal history of transient ischemic attack (TIA), and cerebral infarction without residual deficits: Secondary | ICD-10-CM | POA: Insufficient documentation

## 2019-11-12 DIAGNOSIS — K219 Gastro-esophageal reflux disease without esophagitis: Secondary | ICD-10-CM | POA: Insufficient documentation

## 2019-11-12 DIAGNOSIS — Z7982 Long term (current) use of aspirin: Secondary | ICD-10-CM | POA: Insufficient documentation

## 2019-11-12 DIAGNOSIS — C50919 Malignant neoplasm of unspecified site of unspecified female breast: Secondary | ICD-10-CM

## 2019-11-12 NOTE — Patient Instructions (Addendum)
Our surgery scheduler will contact you to schedule your surgery. Please have the BLUE SHEET available when she calls you.  We will send a Cardiac Clearance to Dr Bethanne Ginger office.   Contact the office if you have any questions or concerns.   Total or Modified Radical Mastectomy A total mastectomy and a modified radical mastectomy are surgeries that are done as part of treatment for breast cancer. You will have one of those types of surgery. Both types involve removing a breast.  In a total mastectomy (simple mastectomy), all breast tissue including the nipple will be removed.  In a modified radical mastectomy, lymph nodes under the arm will be removed along with the breast and nipple. Some of the lining over the muscle tissues under the breast may also be removed. These procedures may also be used to help prevent breast cancer. A preventive (prophylactic) mastectomy may be done if you are at an increased risk of breast cancer due to harmful changes (mutations) in certain genes (BRCA genes). In that case, the procedure involves removing both of your breasts. This can reduce your risk of developing breast cancer in the future. For a transgender person, a total mastectomy may be done as part of a surgical transition from female to female. Let your health care provider know about:  Any allergies you have.  All medicines you are taking, including vitamins, herbs, eye drops, creams, and over-the-counter medicines.  Any problems you or family members have had with anesthetic medicines.  Any blood disorders you have.  Any surgeries you have had.  Any medical conditions you have.  Whether you are pregnant or may be pregnant. What are the risks? Generally, this is a safe procedure. However, problems may occur, including:  Pain.  Infection.  Bleeding.  Allergic reactions to medicines.  Scar tissue.  Chest numbness on the side of the surgery.  Fluid buildup under the skin flaps where your  breast was removed (seroma).  Sensation of throbbing or tingling.  Stress or sadness from losing your breast. If you have the lymph nodes under your arm removed, you may have arm swelling, weakness, or numbness on the same side of your body as your surgery. What happens before the procedure? Staying hydrated Follow instructions from your health care provider about hydration, which may include:  Up to 2 hours before the procedure - you may continue to drink clear liquids, such as water, clear fruit juice, black coffee, and plain tea. Eating and drinking restrictions Follow instructions from your health care provider about eating and drinking, which may include:  8 hours before the procedure - stop eating heavy meals or foods such as meat, fried foods, or fatty foods.  6 hours before the procedure - stop eating light meals or foods, such as toast or cereal.  6 hours before the procedure - stop drinking milk or drinks that contain milk.  2 hours before the procedure - stop drinking clear liquids. Medicines  Ask your health care provider about: ? Changing or stopping your regular medicines. This is especially important if you are taking diabetes medicines or blood thinners. ? Taking medicines such as aspirin and ibuprofen. These medicines can thin your blood. Do not take these medicines unless your health care provider tells you to take them. ? Taking over-the-counter medicines, vitamins, herbs, and supplements.  Your health care team may give you antibiotic medicine to help prevent infection. General instructions  You may be checked for extra fluid around your lymph nodes (lymphedema).  Plan to have someone take you home from the hospital or clinic.  Plan to have a responsible adult care for you for at least 24 hours after you leave the hospital or clinic. This is important.  Ask your health care provider how your surgical site will be marked or identified.  You may be asked to  shower with a germ-killing soap. What happens during the procedure?   To lower your risk of infection: ? Your health care team will wash or sanitize their hands. ? Your skin will be washed with soap.  An IV will be inserted into one of your veins.  You will be given a medicine to make you fall asleep (general anesthetic).  A wide incision will be made around your nipple. The skin and nipple inside the incision will be removed along with all breast tissue.  If you are having a modified radical mastectomy: ? The lining over your chest muscles will be removed. ? The incision may be extended to reach the lymph nodes under your arm, or a second incision may be made. ? Lymph nodes will be removed.  Breast tissue and lymph nodes that are removed will be sent to the lab for testing.  You may have a drainage tube inserted into your incision to collect fluid that builds up after surgery. This tube will be connected to a suction bulb on the outside of your body to remove the fluid.  Your incision or incisions will be closed with stitches (sutures).  A bandage (dressing) will be placed over your breast area. If lymph nodes were removed, a dressing will also be placed under your arm. The procedure may vary among health care providers and hospitals. What happens after the procedure?  Your blood pressure, heart rate, breathing rate, and blood oxygen level will be monitored until the medicines you were given have worn off.  You will be given pain medicine as needed.  You will be encouraged to get up and walk as soon as you can.  Your IV can be removed when you are able to eat and drink.  You may have a drainage tube in place for 2-3 days to prevent a collection of blood (hematoma) from developing in the breast area. You will be given instructions about caring for the drain before you go home.  A pressure bandage may be applied for 1-2 days to prevent bleeding or swelling. Ask your health care  provider how to care for your pressure bandage at home. Summary  In a total mastectomy (simple mastectomy), all breast tissue including the nipple will be removed. In a modified radical mastectomy, the lymph nodes under the arm will be removed along with the breast and nipple.  Before the procedure, follow instructions from your health care provider about eating and drinking, and ask about changing or stopping your regular medicines.  You will be given a medicine to make you fall asleep (general anesthetic) during the procedure. This information is not intended to replace advice given to you by your health care provider. Make sure you discuss any questions you have with your health care provider. Document Revised: 08/31/2018 Document Reviewed: 03/31/2017 Elsevier Patient Education  2020 [employer].

## 2019-11-12 NOTE — Assessment & Plan Note (Addendum)
#  Early stage breast cancer clinical stage I-II [T2 N0] ER/PR positive HER-2 negative.  Awaiting surgical evaluation this afternoon.  # I had a long discussion with the patient in general regarding the treatment options of breast cancer including-surgery; adjuvant radiation; role of adjuvant systemic therapy including-chemotherapy antihormone therapy.   #Discussed the surgical option sentinel lymph node evaluation -lumpectomy versus mastectomy.  Patient is interested in mastectomy; contralateral prophylactic mastectomy.  She is concerned about recurrence/nervous.  Discussed the potential complications of wound healing issues with large incisions/mastectomy-given her poorly controlled diabetes/ongoing smoking etc.  #Discussed the role of chemotherapy; and endocrine therapy.  Patient report candidate for chemotherapy given multiple co-morbidities/poorly controlled diabetes/severe peripheral neuropathy.  However await final pathology.  #Poorly controlled diabetes-on insulin. Defer further management to PCP.  #Peripheral neuropathy grade 2-3; patient goes on with a walker/cane at baseline.  Continue Lyrica.  Thank you Ms.Clark NP for allowing me to participate in the care of your pleasant patient. Please do not hesitate to contact me with questions or concerns in the interim.  Discussed with Ms. Shaver.  DISPOSITION: #Follow-up to be decided post surgery-Dr.B

## 2019-11-12 NOTE — Progress Notes (Signed)
one [place] NOTE  Patient Care Team: Pleas Koch, NP as PCP - General (Internal Medicine) Delrae Rend, MD as Consulting Physician (Endocrinology) Rosamaria Lints, MD (Inactive) as Referring Physician (Neurology) Alvester Chou, NP (Nurse Practitioner) Renato Shin, MD as Consulting Physician (Endocrinology) Theodore Demark, RN as Oncology Nurse Navigator  CHIEF COMPLAINTS/PURPOSE OF CONSULTATION: Breast cancer  #  Oncology History Overview Note  #April 2021-2.5 cm right breast mass [incidental-CT scan chest]  # April 2021- RIGHT BREAST [facility]; s/p ER- 51-90%; PR- 51-90%; Her- 2-NEG. G-2. [Dr.Rodenberg]  # DM- poorly controlled; Seizure disorder/Bipolar/TIA ; Diabetes PN- G-3 [cane/walker; falls]; COPD; OSA active smoker  # SURVIVORSHIP:   # GENETICS:   DIAGNOSIS:   STAGE:         ;  GOALS:  CURRENT/MOST RECENT THERAPY :      Carcinoma of lower-outer quadrant of right breast in female, estrogen receptor positive ([place])  11/12/2019 Initial Diagnosis   Carcinoma of lower-outer quadrant of right breast in female, estrogen receptor positive ([place])      HISTORY OF PRESENTING ILLNESS:  [name] 49 y.o.  female female with no prior history of breast cancer/or malignancies-but with multiple medical problems include history of TIA, bipolar disorder morbid obesity poorly controlled diabetes with neuropathy has been referred to [place] for further evaluation recommendations for new diagnosis of breast cancer.  Patient states that she was recently admitted to hospital for shortness of breath-CT scan chest showed emphysema otherwise no acute process; but showed up to 2.5 cm right breast mass.  This was further worked up with an ultrasound and mammogram and subsequent biopsy as well as above.  Patient is here to discuss treatment options for her breast cancer.  Patient very anxious.  Denies any acute pain.  Any nausea vomiting.  No headaches.   Review of  Systems  Constitutional: Negative for chills, diaphoresis, fever, malaise/fatigue and weight loss.  HENT: Negative for nosebleeds and sore throat.   Eyes: Negative for double vision.  Respiratory: Positive for cough and shortness of breath. Negative for hemoptysis, sputum production and wheezing.   Cardiovascular: Negative for chest pain, palpitations, orthopnea and leg swelling.  Gastrointestinal: Negative for abdominal pain, blood in stool, constipation, diarrhea, heartburn, melena, nausea and vomiting.  Genitourinary: Negative for dysuria, frequency and urgency.  Musculoskeletal: Positive for back pain and joint pain.  Skin: Negative.  Negative for itching and rash.  Neurological: Positive for tingling. Negative for dizziness, focal weakness, weakness and headaches.  Endo/Heme/Allergies: Does not bruise/bleed easily.  Psychiatric/Behavioral: Negative for depression. The patient is nervous/anxious and has insomnia.      MEDICAL HISTORY:  Past Medical History:  Diagnosis Date  . Anxiety    takes Prozac daily  . Anxiety   . Aortic valve calcification   . Asthma    Advair and Spirva daily  . Asthma   . Bipolar disorder ([place])   . CAD (coronary artery disease)    a. LHC 11/2013 done for CP/fluid retention: mild disease in prox LAD, mild-mod disease in mRCA, EF 60% with normal LVEDP. b. Normal nuc 03/2016.  Marland Kitchen CHF (congestive heart failure) ([place])   . Chronic diastolic CHF (congestive heart failure) ([place])   . Chronic heart failure with preserved ejection fraction ([place]) 11/16/2018  . CKD (chronic kidney disease), stage II   . COPD (chronic obstructive pulmonary disease) (H[facility])    a. nocturnal O2.  Marland Kitchen COPD (chronic obstructive pulmonary disease) ([place])   . Coronary artery disease   .  Decreased urine stream   . Diabetes mellitus   . Diabetes mellitus without complication ([place])   . Dyspnea   . Family history of adverse reaction to anesthesia    mom gets nauseated  . GERD (gastroesophageal  reflux disease)    takes Pepcid daily  . History of blood clots    left leg 3-60yr ago  . Hyperlipidemia   . Hypertension   . Hypertriglyceridemia   . Inguinal hernia, left 01/2015  . Muscle spasm   . Open wound of genital labia   . Peripheral neuropathy   . RBBB   . Seizures ([place]   . Sepsis ([place] 01/19/2018  . Sinus tachycardia    a. persistent since 2009.  .Marland KitchenSmokers' cough ([place]   . [facility]   left sided weakness  . TIA (transient ischemic attack)   . Tobacco abuse   . Vulvar abscess 01/23/2018    SURGICAL HISTORY: Past Surgical History:  Procedure Laterality Date  . BREAST BIOPSY Right 11/06/2019   u[place] path pending venus clip  . HERNIA REPAIR    . INCISION AND DRAINAGE ABSCESS N/A 01/26/2018   Procedure: INCISION AND DEBRIDEMENT OF VULVAR NECROTIZING SOFT TISSUE INFECTION;  Surgeon: WGreer Pickerel MD;  [facility]  Service: General;  Laterality: N/A;  . INCISION AND DRAINAGE PERIRECTAL ABSCESS N/A 01/22/2018   Procedure: IRRIGATION AND DEBRIDEMENT LABIAL/VULVAR AREA;  Surgeon: BCoralie Keens MD;  [place] [facility]  Service: General;  Laterality: N/A;  . INCISION AND DRAINAGE PERIRECTAL ABSCESS N/A 01/29/2018   Procedure: IRRIGATION AND DEBRIDEMENT VULVA;  Surgeon: HExcell Seltzer MD;  [place] [facility]  Service: General;  Laterality: N/A;  . INGUINAL HERNIA REPAIR Left 04/08/2015   Procedure: OPEN LEFT INGUINAL HERNIA REPAIR WITH MESH;  Surgeon: ARalene Ok MD;  [place]  Service: General;  Laterality: Left;  . INSERTION OF MESH Left 04/08/2015   Procedure: INSERTION OF MESH;  Surgeon: ARalene Ok MD;  [place] [facility]  Service: General;  Laterality: Left;  . LAPAROSCOPY     Endometriosis  . LEFT HEART CATHETERIZATION WITH CORONARY ANGIOGRAM N/A 12/05/2013   Procedure: LEFT HEART CATHETERIZATION WITH CORONARY ANGIOGRAM;  Surgeon: JJettie Booze MD;  [facility];  Service: Cardiovascular;  Laterality: N/A;  . right kidney  drained    . TEE WITHOUT CARDIOVERSION N/A 11/28/2013   Procedure: TRANSESOPHAGEAL ECHOCARDIOGRAM (TEE);  Surgeon: PThayer Headings MD;  [facility];  Service: Cardiovascular;  Laterality: N/A;    SOCIAL HISTORY: Social History   Socioeconomic History  . Marital status: Married    Spouse name: Not on file  . Number of children: 2  . Years of education: Not on file  . Highest education level: Not on file  Occupational History  . Not on file  Tobacco Use  . Smoking status: Current Some Day Smoker    Packs/day: 1.00    Years: 36.00    Pack years: 36.00    Types: Cigarettes    Start date: 07/11/1981  . Smokeless tobacco: Never Used  . Tobacco comment: 1 ppd now  Substance and Sexual Activity  . Alcohol use: No  . Drug use: No  . Sexual activity: Not Currently  Other Topics Concern  . Not on file  Social History Narrative   ** Merged History Encounter **       [place]  Married but lives with Boyfriend - not legally separated   Disabled - for BiPolar, Seizure  disorder, diabetes   Formerly worked at [employer] 1 ppd; no alcohol. 2 step sons; no biologic children.       Social Determinants of Health   Financial Resource Strain:   . Difficulty of Paying Living Expenses:   Food Insecurity:   . Worried About Charity fundraiser in the Last Year:   . Arboriculturist in the Last Year:   Transportation Needs:   . Film/video editor (Medical):   Marland Kitchen Lack of Transportation (Non-Medical):   Physical Activity:   . Days of Exercise per Week:   . Minutes of Exercise per Session:   Stress:   . Feeling of Stress :   Social Connections:   . Frequency of Communication with Friends and Family:   . Frequency of Social Gatherings with Friends and Family:   . Attends Religious Services:   . Active Member of Clubs or Organizations:   . Attends Archivist Meetings:   Marland Kitchen Marital Status:   Intimate Partner Violence:   . Fear of Current or  Ex-Partner:   . Emotionally Abused:   Marland Kitchen Physically Abused:   . Sexually Abused:     FAMILY HISTORY: Family History  Problem Relation Age of Onset  . Venous thrombosis Brother   . Other Brother        BRAIN TUMOR  . Asthma Father   . Diabetes Father   . Coronary artery disease Mother   . Hypertension Mother   . Diabetes Mother   . Breast cancer Mother 4  . Asthma Sister   . Diabetes type II Brother     ALLERGIES:  is allergic to adhesive [tape]; metoprolol; montelukast; morphine sulfate; penicillins; prednisone; diltiazem; and gabapentin.  MEDICATIONS:  Current Outpatient Medications  Medication Sig Dispense Refill  . acetaminophen (TYLENOL) 500 MG tablet Take 500 mg by mouth every 4 (four) hours as needed for moderate pain.     Marland Kitchen ALPRAZolam (XANAX) 0.5 MG tablet Take 1 tablet (0.5 mg total) by mouth 2 (two) times daily as needed for anxiety. (Patient taking differently: Take 1 mg by mouth daily. )  0  . aspirin EC 81 MG tablet Take 81 mg by mouth every morning.    . budesonide-formoterol (SYMBICORT) 80-4.5 MCG/ACT inhaler INHALE 2 PUFFS BY MOUTH EVERY 12 HOURS TO PREVENT COUGH OR WHEEZING *RINSE MOUTH AFTER EACH USE* 10.2 g 3  . busPIRone (BUSPAR) 30 MG tablet Take 30 mg by mouth 2 (two) times daily.    . famotidine (PEPCID) 20 MG tablet TAKE 1 TABLET (20 MG TOTAL) BY MOUTH 2 (TWO) TIMES DAILY. FOR HEARTBURN. 180 tablet 1  . FLUoxetine (PROZAC) 40 MG capsule Take 40 mg by mouth at bedtime.     Marland Kitchen glucose blood (ONETOUCH VERIO) test strip 1 each by Other route 3 (three) times daily. E11.9; E10.42 100 each 2  . Insulin Pen Needle 32G X 4 MM MISC Used to give insulin injections twice daily. 100 each 11  . insulin regular human CONCENTRATED (HUMULIN R) 500 UNIT/ML injection Inject 0.8 mLs (400 Units total) into the skin 2 (two) times daily with a meal. DOSAGE IS CORRECT. PLEASE FILL AS PRESCRIBED 48 mL 2  . Insulin Syringe-Needle U-100 (INSULIN SYRINGE 1CC/30GX1/2") 30G X 1/2" 1 ML  MISC 1 each by Does not apply route 2 (two) times daily. E11.9 200 each 0  . ipratropium-albuterol (DUONEB) 0.5-2.5 (3) MG/3ML SOLN Take 3 mLs by nebulization every 4 (four)  hours as needed (wheezing/shortness of breath). 360 mL 0  . metolazone (ZAROXOLYN) 5 MG tablet Take by mouth.    . ondansetron (ZOFRAN-ODT) 4 MG disintegrating tablet once daily as needed    . OneTouch Delica Lancets 06C MISC 1 each by Does not apply route 3 (three) times daily. Use to monitor glucose levels three times daily; E11.9; E10.42 100 each 11  . OXYGEN Inhale 4 L into the lungs at bedtime.     . potassium chloride 20 MEQ TBCR Take 20 mEq by mouth daily. 60 tablet 2  . pregabalin (LYRICA) 300 MG capsule Take 1 capsule (300 mg total) by mouth 2 (two) times daily. For neuropathy. 180 capsule 0  . rOPINIRole (REQUIP) 1 MG tablet TAKE 1 TABLET BY MOUTH EVERY NIGHT AT BEDTIME FOR RESTLESS LEGS. 90 tablet 1  . spironolactone (ALDACTONE) 50 MG tablet TAKE 1 TABLET BY MOUTH ONCE A DAY 90 tablet 1  . tobramycin (TOBREX) 0.3 % ophthalmic solution Place 1 drop into both eyes daily.     Marland Kitchen torsemide (DEMADEX) 20 MG tablet TAKE 1 TABLET BY MOUTH 4 TIMES DAILY (Patient taking differently: 40 mg 2 (two) times daily. ) 360 tablet 3  . traZODone (DESYREL) 100 MG tablet Take 100 mg by mouth daily.     Marland Kitchen ULTICARE MINI PEN NEEDLES 31G X 6 MM MISC USE AS DIRECTED THREE TIMES DAILY 100 each 0  . rosuvastatin (CRESTOR) 10 MG tablet Take 1 tablet (10 mg total) by mouth daily. 90 tablet 3   No current facility-administered medications for this visit.      Marland Kitchen  PHYSICAL EXAMINATION: ECOG PERFORMANCE STATUS: 0 - Asymptomatic  Vitals:   11/12/19 1433  BP: 117/60  Pulse: 82  Resp: 18  Temp: 98.1 F (36.7 C)  SpO2: 100%   Filed Weights   11/12/19 1433  Weight: 289 lb 8 oz (131.3 kg)    Physical Exam  Constitutional: She is oriented to person, place, and time and well-developed, well-nourished, and in no distress.  Patient is  obese.  She is in a wheelchair.  Accompanied by husband.  HENT:  Head: Normocephalic and atraumatic.  Mouth/Throat: Oropharynx is clear and moist. No oropharyngeal exudate.  Eyes: Pupils are equal, round, and reactive to light.  Cardiovascular: Normal rate and regular rhythm.  Pulmonary/Chest: Effort normal and breath sounds normal. No respiratory distress. She has no wheezes.  Abdominal: Soft. Bowel sounds are normal. She exhibits no distension and no mass. There is no abdominal tenderness. There is no rebound and no guarding.  Musculoskeletal:        General: No tenderness or edema. Normal range of motion.     Cervical back: Normal range of motion and neck supple.  Neurological: She is alert and oriented to person, place, and time.  Skin: Skin is warm.  Psychiatric: Affect normal.  Anxious.     LABORATORY DATA:  I have reviewed the data as listed Lab Results  Component Value Date   WBC 10.2 10/23/2019   HGB 14.3 10/23/2019   HCT 43.9 10/23/2019   MCV 87.8 10/23/2019   PLT 219 10/23/2019   Recent Labs    02/27/19 1302 02/27/19 1308 03/21/19 1353 03/21/19 1403 07/18/19 1506 07/18/19 1506 07/26/19 1337 09/23/19 1624 09/23/19 1732 10/23/19 1619  NA 130*   < > 130*   < > 131*   < > 127* 132*  --  132*  K 4.2   < > 3.8   < > 4.4   < >  3.9 3.7  --  3.5  CL 87*   < > 89*   < > 85*   < > 82* 86*  --  87*  CO2 28   < > 27   < > 34*   < > 33* 34*  --  32  GLUCOSE 431*   < > 434*   < > 447*   < > 441* 487*  --  401*  BUN 37*   < > 36*   < > 52*   < > 54* 44*  --  52*  CREATININE 1.53*   < > 1.47*   < > 1.59*  --  1.81* 1.41*  --  1.62*  CALCIUM 10.7*   < > 9.5   < > 9.9   < > 10.2 10.0  --  9.5  GFRNONAA 39*   < > 41*  --   --   --   --  43*  --  37*  GFRAA 45*   < > 48*  --   --   --   --  50*  --  42*  PROT 8.7*   < > 8.0  --  8.3*  --   --   --  8.8*  --   ALBUMIN 3.6  --  3.4*  --   --   --   --   --  4.0  --   AST 48*   < > 40  --  44*  --   --   --  40  --   ALT 40    < > 45*  --  51*  --   --   --  44  --   ALKPHOS 120  --  93  --   --   --   --   --  125  --   BILITOT 1.3*   < > 0.6  --  0.8  --   --   --  1.1  --   BILIDIR  --   --   --   --   --   --   --   --  0.2  --   IBILI  --   --   --   --   --   --   --   --  0.9  --    < > = values in this interval not displayed.    RADIOGRAPHIC STUDIES: I have personally reviewed the radiological images as listed and agreed with the findings in the report. US BREAST LTD UNI RIGHT INC AXILLA  Result Date: 11/05/2019 CLINICAL DATA:  49 year old patient recently had a CT angio chest September 23, 2019 which described a 2.3 cm mass in the lower right breast. Patient presents today for evaluation of this mass. She describes having burning pain in the inferior right breast. This is her baseline mammogram. Her mother has a history of breast cancer at 49. The patient is in a wheelchair and unable to stand for today's images. EXAM: DIGITAL DIAGNOSTIC BILATERAL MAMMOGRAM WITH CAD AND TOMO ULTRASOUND RIGHT BREAST COMPARISON:  CT angio chest March 2021.  No prior mammograms. ACR Breast Density Category b: There are scattered areas of fibroglandular density. FINDINGS: Some of the posterior breast parenchyma and axillary regions are excluded from the images due to patient in seated position in a wheelchair. Irregular mass in the middle to posterior third of the inferior central right breast. There are a few calcifications within  the right breast mass. No additional suspicious microcalcifications, additional mass, or distortion is identified in the right breast. Skin thickness appears normal. There are no findings suspicious for malignancy on the left. Mammographic images were processed with CAD. On physical exam, there is a firm palpable mass approximately 2 cm from the nipple in the 7 o'clock axis of the right breast. On physical exam, the mass is approximately 2 cm in size. Targeted ultrasound is performed, showing a hypoechoic irregular  mass with internal calcification is identified at approximately 7 o'clock position 2-3 cm from the nipple. Mass measures 2.3 x 1.8 x 1.7 cm. A focus of internal vascular flow is identified. Ultrasound survey of the right axilla shows normal lymph nodes. No lymphadenopathy detected in the right axilla. IMPRESSION: Suspicious 2.3 cm mass in the 7 o'clock position of the right breast. The mass is palpable. No evidence of malignancy in the left breast. Mammographic evaluation limited by wheelchair status. RECOMMENDATION: Ultrasound-guided core needle biopsy of the right breast mass is being scheduled for the patient. The procedure of biopsy was discussed in detail with the patient and her husband today. I have discussed the findings and recommendations with the patient. If applicable, a reminder letter will be sent to the patient regarding the next appointment. BI-RADS CATEGORY  5: Highly suggestive of malignancy. Electronically Signed   By: Curlene Dolphin M.D.   On: 11/05/2019 10:30   MM DIAG BREAST TOMO BILATERAL  Result Date: 11/05/2019 CLINICAL DATA:  49 year old patient recently had a CT angio chest September 23, 2019 which described a 2.3 cm mass in the lower right breast. Patient presents today for evaluation of this mass. She describes having burning pain in the inferior right breast. This is her baseline mammogram. Her mother has a history of breast cancer at 42. The patient is in a wheelchair and unable to stand for today's images. EXAM: DIGITAL DIAGNOSTIC BILATERAL MAMMOGRAM WITH CAD AND TOMO ULTRASOUND RIGHT BREAST COMPARISON:  CT angio chest March 2021.  No prior mammograms. ACR Breast Density Category b: There are scattered areas of fibroglandular density. FINDINGS: Some of the posterior breast parenchyma and axillary regions are excluded from the images due to patient in seated position in a wheelchair. Irregular mass in the middle to posterior third of the inferior central right breast. There are a few  calcifications within the right breast mass. No additional suspicious microcalcifications, additional mass, or distortion is identified in the right breast. Skin thickness appears normal. There are no findings suspicious for malignancy on the left. Mammographic images were processed with CAD. On physical exam, there is a firm palpable mass approximately 2 cm from the nipple in the 7 o'clock axis of the right breast. On physical exam, the mass is approximately 2 cm in size. Targeted ultrasound is performed, showing a hypoechoic irregular mass with internal calcification is identified at approximately 7 o'clock position 2-3 cm from the nipple. Mass measures 2.3 x 1.8 x 1.7 cm. A focus of internal vascular flow is identified. Ultrasound survey of the right axilla shows normal lymph nodes. No lymphadenopathy detected in the right axilla. IMPRESSION: Suspicious 2.3 cm mass in the 7 o'clock position of the right breast. The mass is palpable. No evidence of malignancy in the left breast. Mammographic evaluation limited by wheelchair status. RECOMMENDATION: Ultrasound-guided core needle biopsy of the right breast mass is being scheduled for the patient. The procedure of biopsy was discussed in detail with the patient and her husband today. I have discussed the  findings and recommendations with the patient. If applicable, a reminder letter will be sent to the patient regarding the next appointment. BI-RADS CATEGORY  5: Highly suggestive of malignancy. Electronically Signed   By: Curlene Dolphin M.D.   On: 11/05/2019 10:30   MM CLIP PLACEMENT RIGHT  Result Date: 11/06/2019 CLINICAL DATA:  Evaluate biopsy marker EXAM: DIAGNOSTIC RIGHT MAMMOGRAM POST ULTRASOUND BIOPSY COMPARISON:  Previous exam(s). FINDINGS: Mammographic images were obtained following ultrasound guided biopsy of a 7 o'clock right breast mass. The biopsy marking clip is in expected position at the site of biopsy. IMPRESSION: Appropriate positioning of the  venous shaped biopsy marking clip at the site of biopsy in the biopsied mass. Final Assessment: Post Procedure Mammograms for Marker Placement Electronically Signed   By: Dorise Bullion III M.D   On: 11/06/2019 13:50   [place] RT BREAST BX W LOC DEV 1ST LESION IMG BX SPEC US GUIDE  Addendum Date: 11/08/2019   ADDENDUM REPORT: 11/08/2019 14:12 ADDENDUM: PATHOLOGY revealed: A. BREAST, RIGHT AT 7:00, 3 CM FROM THE NIPPLE; ULTRASOUND-GUIDED CORE NEEDLE BIOPSY: - INVASIVE MAMMARY CARCINOMA, NO SPECIAL TYPE. 10 mm in this sample. Grade 2. Ductal carcinoma in situ: Not identified. Lymphovascular invasion: Not identified. Pathology results are CONCORDANT with imaging findings, per Dr. Dorise Bullion. Pathology results and recommendations below were discussed with patient by telephone on 11/07/2019. Patient reported biopsy site doing well with slight tenderness at the site. Post biopsy care instructions were reviewed and questions were answered. Patient was instructed to call [facility] if any concerns or questions arise related to the biopsy. Recommendation: Surgical referral. Request for surgical referral was relayed to [place] and Tanya Nones RN at [facility] by Electa Sniff RN ON 11/07/2019. Addendum by Electa Sniff RN on 11/08/2019. Electronically Signed   By: Dorise Bullion III M.D   On: 11/08/2019 14:12   Result Date: 11/08/2019 CLINICAL DATA:  Biopsy of a right breast mass EXAM: ULTRASOUND GUIDED RIGHT BREAST CORE NEEDLE BIOPSY COMPARISON:  Previous exam(s). PROCEDURE: I met with the patient and we discussed the procedure of ultrasound-guided biopsy, including benefits and alternatives. We discussed the high likelihood of a successful procedure. We discussed the risks of the procedure, including infection, bleeding, tissue injury, clip migration, and inadequate sampling. Informed written consent was given. The usual time-out protocol was performed immediately prior to the  procedure. Lesion quadrant: 7 o'clock Using sterile technique and 1% Lidocaine as local anesthetic, under direct ultrasound visualization, a 12 gauge spring-loaded device was used to perform biopsy of a right breast mass using a lateral approach. At the conclusion of the procedure a venous shaped tissue marker clip was deployed into the biopsy cavity. Follow up 2 view mammogram was performed and dictated separately. IMPRESSION: Ultrasound guided biopsy of a right breast mass at 7 o'clock. No apparent complications. Electronically Signed: By: Dorise Bullion III M.D On: 11/06/2019 13:29    ASSESSMENT & PLAN:   Carcinoma of lower-outer quadrant of right breast in female, estrogen receptor positive ([place]) #Early stage breast cancer clinical stage I-II [T2 N0] ER/PR positive HER-2 negative.  Awaiting surgical evaluation this afternoon.  # I had a long discussion with the patient in general regarding the treatment options of breast cancer including-surgery; adjuvant radiation; role of adjuvant systemic therapy including-chemotherapy antihormone therapy.   #Discussed the surgical option sentinel lymph node evaluation -lumpectomy versus mastectomy.  Patient is interested in mastectomy; contralateral prophylactic mastectomy.  She is concerned about recurrence/nervous.  Discussed  the potential complications of wound healing issues with large incisions/mastectomy-given her poorly controlled diabetes/ongoing smoking etc.  #Discussed the role of chemotherapy; and endocrine therapy.  Patient report candidate for chemotherapy given multiple co-morbidities/poorly controlled diabetes/severe peripheral neuropathy.  However await final pathology.  #Poorly controlled diabetes-on insulin. Defer further management to PCP.  #Peripheral neuropathy grade 2-3; patient goes on with a walker/cane at baseline.  Continue Lyrica.  Thank you Ms.Clark NP for allowing me to participate in the care of your pleasant patient. Please  do not hesitate to contact me with questions or concerns in the interim.  Discussed with Ms. Shaver.  DISPOSITION: #Follow-up to be decided post surgery-Dr.B    All questions were answered. The patient/family knows to call the clinic with any problems, questions or concerns.       Cammie Sickle, MD 11/12/2019 7:40 PM

## 2019-11-12 NOTE — Progress Notes (Signed)
Patient ID: [name], female   DOB: 1970-12-17, 49 y.o.   MRN: [id number]  Chief Complaint: Estrogen receptor positive right breast cancer  History of Present Illness [name] is a 49 y.o. female with a right breast mass diagnosed coincidentally on the chest CT scan exam.  Subsequently received her first mammogram, breast ultrasound and core biopsy of her breast mass.  Notable was her axillary ultrasound which we did not reveal any abnormal lymph nodes.  She now complains of some throbbing pain in the recently biopsied breast mass.  She is currently taking Tylenol for this.  She denies any prior history of birth control or hormonal replacement therapy as she is still having menstrual periods, the last menstrual period about a week ago.  She does have a family breast cancer history involving her mother.  She began having menses at the age of 48.  She is gravida 2 but has had 2 miscarriages.  She has had no breast skin changes or discharge from her nipple.  I have brought this bit of information forward from the chart: Almer's major active medical problems include: # CV Disease: hx of TIA, Sinus Tachycardia, HTN, HLD, DM, tobacco abuse, OSA, poor dentention - poorly controlled chronic diseases - Hospitalized in October 2014 for fatigue, areflexia, generalized weakness and chest pain             - Carotid ultrasound equals 1-39% carotid stenosis             - Echo = EF 55-65%, echodensity on aortic valve, TEE recommended # Uncontrolled DM: with peripheral neuropathy  Managed by Endocrinology - Buddy Duty  Most recent A1c (outside lab) 01/23/13 = 7.7 # Neuropsych: Seizure Disorder, Bipolar Disorder, Anxiety  Followed by Dr. Candis Schatz # RESP: COPD, Asthma, Sleep Apnea,  Past Medical History Past Medical History:  Diagnosis Date  . Anxiety    takes Prozac daily  . Anxiety   . Aortic valve calcification   . Asthma    Advair and Spirva daily  . Asthma   . Bipolar disorder ([place])   . CAD  (coronary artery disease)    a. LHC 11/2013 done for CP/fluid retention: mild disease in prox LAD, mild-mod disease in mRCA, EF 60% with normal LVEDP. b. Normal nuc 03/2016.  Marland Kitchen CHF (congestive heart failure) ([place])   . Chronic diastolic CHF (congestive heart failure) ([place])   . Chronic heart failure with preserved ejection fraction ([place]) 11/16/2018  . CKD (chronic kidney disease), stage II   . COPD (chronic obstructive pulmonary disease) (HCC)    a. nocturnal O2.  Marland Kitchen COPD (chronic obstructive pulmonary disease) ([place])   . Coronary artery disease   . Decreased urine stream   . Diabetes mellitus   . Diabetes mellitus without complication ([place])   . Dyspnea   . Family history of adverse reaction to anesthesia    mom gets nauseated  . GERD (gastroesophageal reflux disease)    takes Pepcid daily  . History of blood clots    left leg 3-69yrs ago  . Hyperlipidemia   . Hypertension   . Hypertriglyceridemia   . Inguinal hernia, left 01/2015  . Muscle spasm   . Open wound of genital labia   . Peripheral neuropathy   . RBBB   . Seizures ([place])   . Sepsis ([place]) 01/19/2018  . Sinus tachycardia    a. persistent since 2009.  Marland Kitchen Smokers' cough ([place])   . Stroke [facility]) 1989   left sided weakness  .  TIA (transient ischemic attack)   . Tobacco abuse   . Vulvar abscess 01/23/2018      Past Surgical History:  Procedure Laterality Date  . BREAST BIOPSY Right 11/06/2019   [place] core path pending venus clip  . HERNIA REPAIR    . INCISION AND DRAINAGE ABSCESS N/A 01/26/2018   Procedure: INCISION AND DEBRIDEMENT OF VULVAR NECROTIZING SOFT TISSUE INFECTION;  Surgeon: Greer Pickerel, MD;  Location: [place];  Service: General;  Laterality: N/A;  . INCISION AND DRAINAGE PERIRECTAL ABSCESS N/A 01/22/2018   Procedure: IRRIGATION AND DEBRIDEMENT LABIAL/VULVAR AREA;  Surgeon: Coralie Keens, MD;  Location: [place];  Service: General;  Laterality: N/A;  . INCISION AND DRAINAGE PERIRECTAL ABSCESS N/A 01/29/2018   Procedure:  IRRIGATION AND DEBRIDEMENT VULVA;  Surgeon: Excell Seltzer, MD;  Location: [place];  Service: General;  Laterality: N/A;  . INGUINAL HERNIA REPAIR Left 04/08/2015   Procedure: OPEN LEFT INGUINAL HERNIA REPAIR WITH MESH;  Surgeon: Ralene Ok, MD;  Location: [place];  Service: General;  Laterality: Left;  . INSERTION OF MESH Left 04/08/2015   Procedure: INSERTION OF MESH;  Surgeon: Ralene Ok, MD;  Location: [place];  Service: General;  Laterality: Left;  . LAPAROSCOPY     Endometriosis  . LEFT HEART CATHETERIZATION WITH CORONARY ANGIOGRAM N/A 12/05/2013   Procedure: LEFT HEART CATHETERIZATION WITH CORONARY ANGIOGRAM;  Surgeon: Jettie Booze, MD;  [facility] C[facility] LAB;  Service: Cardiovascular;  Laterality: N/A;  . right kidney drained    . TEE WITHOUT CARDIOVERSION N/A 11/28/2013   Procedure: TRANSESOPHAGEAL ECHOCARDIOGRAM (TEE);  Surgeon: Thayer Headings, MD;  Location: [place];  Service: Cardiovascular;  Laterality: N/A;    Allergies  Allergen Reactions  . Adhesive [Tape] Rash and Other (See Comments)    TAKES OFF THE SKIN (CERTAIN MEDICAL TAPES DO THIS!!)  . Metoprolol Shortness Of Breath    Occurrence of shortness of breath after 3 days  . Montelukast Shortness Of Breath  . Morphine Sulfate Anaphylaxis, Shortness Of Breath and Nausea And Vomiting    Swollen Throat - Able to tolerate dilaudid  . Penicillins Anaphylaxis, Hives and Shortness Of Breath    Throat swells Has patient had a PCN reaction causing immediate rash, facial/tongue/throat swelling, SOB or lightheadedness with hypotension: Yes Has patient had a PCN reaction causing severe rash involving mucus membranes or skin necrosis: No Has patient had a PCN reaction that required hospitalization: Yes Has patient had a PCN reaction occurring within the last 10 years: No If all of the above answers are "NO", then may proceed with Cephalosporin use.   . Prednisone Anaphylaxis  . Diltiazem Swelling  . Gabapentin  Swelling    Current Outpatient Medications  Medication Sig Dispense Refill  . acetaminophen (TYLENOL) 500 MG tablet Take 500 mg by mouth every 4 (four) hours as needed for moderate pain.     Marland Kitchen ALPRAZolam (XANAX) 0.5 MG tablet Take 1 tablet (0.5 mg total) by mouth 2 (two) times daily as needed for anxiety. (Patient taking differently: Take 1 mg by mouth daily. )  0  . aspirin EC 81 MG tablet Take 81 mg by mouth every morning.    . budesonide-formoterol (SYMBICORT) 80-4.5 MCG/ACT inhaler INHALE 2 PUFFS BY MOUTH EVERY 12 HOURS TO PREVENT COUGH OR WHEEZING *RINSE MOUTH AFTER EACH USE* 10.2 g 3  . busPIRone (BUSPAR) 30 MG tablet Take 30 mg by mouth 2 (two) times daily.    . famotidine (PEPCID) 20 MG tablet TAKE 1 TABLET (20  MG TOTAL) BY MOUTH 2 (TWO) TIMES DAILY. FOR HEARTBURN. 180 tablet 1  . FLUoxetine (PROZAC) 40 MG capsule Take 40 mg by mouth at bedtime.     Marland Kitchen glucose blood (ONETOUCH VERIO) test strip 1 each by Other route 3 (three) times daily. E11.9; E10.42 100 each 2  . Insulin Pen Needle 32G X 4 MM MISC Used to give insulin injections twice daily. 100 each 11  . insulin regular human CONCENTRATED (HUMULIN R) 500 UNIT/ML injection Inject 0.8 mLs (400 Units total) into the skin 2 (two) times daily with a meal. DOSAGE IS CORRECT. PLEASE FILL AS PRESCRIBED 48 mL 2  . Insulin Syringe-Needle U-100 (INSULIN SYRINGE 1CC/30GX1/2") 30G X 1/2" 1 ML MISC 1 each by Does not apply route 2 (two) times daily. E11.9 200 each 0  . ipratropium-albuterol (DUONEB) 0.5-2.5 (3) MG/3ML SOLN Take 3 mLs by nebulization every 4 (four) hours as needed (wheezing/shortness of breath). 360 mL 0  . metolazone (ZAROXOLYN) 5 MG tablet Take by mouth.    . ondansetron (ZOFRAN-ODT) 4 MG disintegrating tablet once daily as needed    . OneTouch Delica Lancets 99991111 MISC 1 each by Does not apply route 3 (three) times daily. Use to monitor glucose levels three times daily; E11.9; E10.42 100 each 11  . OXYGEN Inhale 4 L into the lungs  at bedtime.     . potassium chloride 20 MEQ TBCR Take 20 mEq by mouth daily. 60 tablet 2  . pregabalin (LYRICA) 300 MG capsule Take 1 capsule (300 mg total) by mouth 2 (two) times daily. For neuropathy. 180 capsule 0  . rOPINIRole (REQUIP) 1 MG tablet TAKE 1 TABLET BY MOUTH EVERY NIGHT AT BEDTIME FOR RESTLESS LEGS. 90 tablet 1  . rosuvastatin (CRESTOR) 10 MG tablet Take 1 tablet (10 mg total) by mouth daily. 90 tablet 3  . spironolactone (ALDACTONE) 50 MG tablet TAKE 1 TABLET BY MOUTH ONCE A DAY 90 tablet 1  . tobramycin (TOBREX) 0.3 % ophthalmic solution Place 1 drop into both eyes daily.     Marland Kitchen torsemide (DEMADEX) 20 MG tablet TAKE 1 TABLET BY MOUTH 4 TIMES DAILY (Patient taking differently: 40 mg 2 (two) times daily. ) 360 tablet 3  . traZODone (DESYREL) 100 MG tablet Take 100 mg by mouth daily.     Marland Kitchen ULTICARE MINI PEN NEEDLES 31G X 6 MM MISC USE AS DIRECTED THREE TIMES DAILY 100 each 0   No current facility-administered medications for this visit.    Family History Family History  Problem Relation Age of Onset  . Venous thrombosis Brother   . Other Brother        BRAIN TUMOR  . Asthma Father   . Diabetes Father   . Coronary artery disease Mother   . Hypertension Mother   . Diabetes Mother   . Breast cancer Mother 72  . Asthma Sister   . Diabetes type II Brother       Social History Social History   Tobacco Use  . Smoking status: Current Some Day Smoker    Packs/day: 1.00    Years: 36.00    Pack years: 36.00    Types: Cigarettes    Start date: 07/11/1981  . Smokeless tobacco: Never Used  . Tobacco comment: 1 ppd now  Substance Use Topics  . Alcohol use: No  . Drug use: No        Review of Systems  Constitutional: Negative for weight loss.  HENT: Negative for hearing loss.  Eyes: Negative.   Respiratory: Negative for wheezing.   Cardiovascular: Negative for chest pain.  Gastrointestinal: Negative.   Genitourinary: Negative.   Musculoskeletal: Negative.    Skin: Negative.   Neurological: Negative.   Endo/Heme/Allergies: Negative.       Physical Exam Blood pressure 123/90, pulse 78, temperature 97.7 F (36.5 C), resp. rate 16, height 5\' 4"  (1.626 m), weight 289 lb (131.1 kg), SpO2 90 %. Last Weight  Most recent update: 11/12/2019  3:57 PM   Weight  131.1 kg (289 lb)            CONSTITUTIONAL: Well developed, and nourished, appropriately responsive and aware without distress.  Appears chronically ill, bright alert interactive, presents in wheelchair. EYES: Sclera non-icteric.   EARS, NOSE, MOUTH AND THROAT: Mask worn.    Hearing is intact to voice.  NECK: Trachea is midline, and there is no jugular venous distension.  LYMPH NODES:  Lymph nodes in the neck are not appreciable. RESPIRATORY:  Lungs are clear, end inspiratory rhonchi noticed on left base, no wheezing appreciated and breath sounds are equal bilaterally. Normal respiratory effort without pathologic use of accessory muscles. CARDIOVASCULAR: Heart is regular in rate and rhythm. GI: The abdomen is well rounded, soft, nontender, and nondistended.  GU: The right breast set shows minimal amounts of ecchymosis in the outer lower quadrant of the right breast there is a vague mass appreciable on exam. MUSCULOSKELETAL:  Symmetrical muscle tone appreciated in all four extremities.    SKIN: Skin turgor is normal. No pathologic skin lesions appreciated.  NEUROLOGIC:  Motor and sensation appear grossly normal.  Cranial nerves are grossly without defect. PSYCH:  Alert and oriented to person, place and time. Affect is appropriate for situation.  Data Reviewed I have personally reviewed what is currently available of the patient's imaging, recent labs and medical records.   Labs:  CBC Latest Ref Rng & Units 10/23/2019 09/23/2019 07/26/2019  WBC 4.0 - 10.5 K/uL 10.2 11.3(H) 9.3  Hemoglobin 12.0 - 15.0 g/dL 14.3 13.6 14.6  Hematocrit 36.0 - 46.0 % 43.9 40.9 42.8  Platelets 150 - 400 K/uL 219  203 204.0   CMP Latest Ref Rng & Units 10/23/2019 09/23/2019 07/26/2019  Glucose 70 - 99 mg/dL 401(H) 487(H) 441(H)  BUN 6 - 20 mg/dL 52(H) 44(H) 54(H)  Creatinine 0.44 - 1.00 mg/dL 1.62(H) 1.41(H) 1.81(H)  Sodium 135 - 145 mmol/L 132(L) 132(L) 127(L)  Potassium 3.5 - 5.1 mmol/L 3.5 3.7 3.9  [place] 98 - 111 mmol/L 87(L) 86(L) 82(L)  CO2 22 - 32 mmol/L 32 34(H) 33(H)  Calcium 8.9 - 10.3 mg/dL 9.5 10.0 10.2  Total Protein 6.5 - 8.1 g/dL - 8.8(H) -  Total Bilirubin 0.3 - 1.2 mg/dL - 1.1 -  Alkaline Phos 38 - 126 U/L - 125 -  AST 15 - 41 U/L - 40 -  ALT 0 - 44 U/L - 44 -      Imaging:  Within last 24 hrs: No results found.  Assessment    Right breast cancer. Patient Active Problem List   Diagnosis Date Noted  . Carcinoma of lower-outer quadrant of right breast in female, estrogen receptor positive ([place]) 11/12/2019  . Restrictive lung disease 10/08/2019  . Breast mass 10/07/2019  . Postmenopausal bleeding 08/30/2019  . Carotid stenosis, asymptomatic, right 08/16/2019  . Nausea and vomiting 07/15/2019  . Cellulitis of left lower extremity 06/04/2019  . Multiple wounds of skin 06/04/2019  . Left-sided weakness 09/13/2018  . Weakness of left lower  extremity 09/05/2018  . Recurrent falls 09/05/2018  . PAD (peripheral artery disease) ([place]) 08/27/2018  . Chronic congestive heart failure ([place]) 08/02/2018  . Vaginal itching 06/15/2018  . Foot pain, bilateral 05/22/2018  . Chronic upper extremity pain (Secondary Area of Pain) (Bilateral) 04/23/2018  . Diabetic polyneuropathy associated with type 1 diabetes mellitus ([place]) 04/23/2018  . Long term prescription benzodiazepine use 04/23/2018  . Neurogenic pain 04/23/2018  . Vitamin D insufficiency 01/29/2018  . Elevated C-reactive protein (CRP) 01/29/2018  . Elevated sedimentation rate 01/29/2018  . Pressure injury of skin 01/29/2018  . Poorly controlled type 2 diabetes mellitus ([place]) 01/23/2018  . DNR (do not resuscitate) 01/23/2018   . IBS (irritable bowel syndrome) 01/19/2018  . Elevated serum hCG 01/19/2018  . Chronic lower extremity pain (Primary Area of Pain) (Bilateral) 01/08/2018  . Chronic low back pain [place] Area of Pain) (Bilateral) w/ sciatica (Bilateral) 01/08/2018  . Chronic pain syndrome 01/08/2018  . Pharmacologic therapy 01/08/2018  . Disorder of skeletal system 01/08/2018  . Problems influencing health status 01/08/2018  . Long term current use of opiate analgesic 01/08/2018  . Restless leg syndrome 08/02/2017  . Nocturnal hypoxemia 03/29/2017  . Vision disturbance 02/28/2017  . CKD (chronic kidney disease), stage II 02/28/2017  . Visual loss, bilateral   . Chronic respiratory failure with hypoxia (HCC)/ nocturnal 02 dep  10/28/2016  . Upper airway cough syndrome 08/22/2016  . Cigarette smoker 06/15/2016  . Simple chronic bronchitis ([place])   . Coronary artery disease involving native heart without angina pectoris 05/16/2016  . Acute on chronic diastolic (congestive) heart failure ([place]) 11/04/2015  . Orthopnea 11/03/2015  . Bilateral leg edema   . Diabetes ([place])   . COPD exacerbation ([place]) 10/15/2015  . Fracture of rib, closed 10/15/2015  . Venous stasis of both lower extremities 03/29/2015  . Preventative health care 02/04/2015  . Essential hypertension 01/02/2015  . Inguinal hernia 11/27/2014  . Allergic rhinitis with postnasal drip 01/21/2014  . Aortic valve disorders 04/18/2013  . Sleep apnea 05/22/2012  . Seizure disorder ([place]) 10/09/2011  . Bipolar disorder ([place]) 10/09/2011  . TIA (transient ischemic attack) 06/22/2011  . Constipation 06/15/2011  . HYPERTRIGLYCERIDEMIA 07/17/2007  . Situational anxiety 05/30/2007  . Asthma-COPD overlap syndrome ([place]) 05/30/2007  . Morbid (severe) obesity due to excess calories ([place]) 04/23/2007  . Tobacco abuse 09/07/2006    Plan    Right simple mastectomy with sentinel lymph node biopsy. I discussed the available options with the patient.  The risk of recurrence is similar between mastectomy and lumpectomy with radiation.  I also discussed that given the size of the cancer would recommend simple mastectomy, which appears to be her preference thus avoiding postoperative radiation, well excepting a slightly longer anesthetic.  We discussed she is not a good risk for additional reconstruction.  I also discussed that we would need to do a sentinel lymph node biopsy to check the nodes.   Explained to the patient that after her surgical treatment additional treatment will depend on her prognostic indicators and stage.    I discussed risks of bleeding, infection, damage to surrounding tissues, having positive margins, needing further resection, damage to nerves causing arm numbness or difficulty raising arm, causing lymphedema in the arm; as well as anesthesia risks of MI, stroke, prolonged ventilation, pulmonary embolism, thrombosis and even death.   Patient was given the opportunity to ask questions and have them answered.  They would like to proceed with right breast simple mastectomy with sentinel lymph  node biopsy.   Face-to-face time spent with the patient and accompanying care providers(if present) was 40 minutes, with more than 50% of the time spent counseling, educating, and coordinating care of the patient.      Ronny Bacon M.D., FACS 11/12/2019, 4:57 PM

## 2019-11-12 NOTE — Progress Notes (Signed)
New patient visit referred by Dr Carlis Abbott.

## 2019-11-13 ENCOUNTER — Telehealth: Payer: Self-pay | Admitting: Surgery

## 2019-11-13 ENCOUNTER — Telehealth: Payer: Self-pay | Admitting: Emergency Medicine

## 2019-11-13 ENCOUNTER — Other Ambulatory Visit: Payer: Self-pay | Admitting: Surgery

## 2019-11-13 DIAGNOSIS — C50511 Malignant neoplasm of lower-outer quadrant of right female breast: Secondary | ICD-10-CM

## 2019-11-13 NOTE — Telephone Encounter (Signed)
Cardiac Clearance sent to Dr Ubaldo Glassing.   [id number]

## 2019-11-13 NOTE — Telephone Encounter (Signed)
Pt has been advised of Pre-Admission date/time, COVID Testing date and Surgery date.  Surgery Date: 11/20/19 Preadmission Testing Date: 11/15/19 (phone 8a-1p) Covid Testing Date: 11/18/19 - patient advised to go to the [facility] ([place] between 8a-1p  Patient has been made aware to arrive at nuc med day of surgery 11/20/19 @ 8:15 am then surgery to follow.

## 2019-11-14 NOTE — Progress Notes (Signed)
Patient reports she is scheduled for mastectomy on 5/12, and will stay overnight. States she is pleased with this recommendation.  Message sent to Med/Onc to schedule follow-up appointment.

## 2019-11-15 ENCOUNTER — Other Ambulatory Visit: Payer: Self-pay

## 2019-11-15 ENCOUNTER — Encounter
Admission: RE | Admit: 2019-11-15 | Discharge: 2019-11-15 | Disposition: A | Discharge status: Home / Self Care | Payer: Medicare HMO | Source: Ambulatory Visit | Attending: Surgery | Admitting: Surgery

## 2019-11-15 NOTE — Patient Instructions (Signed)
Your procedure is scheduled on: 11/20/19 Report to  [place] 1 SUPPORT PERSON WITH YOU THE DAY OF SURGERY. THEY WILL NOT BE ALOUD TO BE WITH YOU IN THE PRE OP AREA. IF YOU GET ADMITTED TO THE HOSPITAL AFTER YOUR PROCEDURE YOU MAY HAVE 2 VISITORS. THEY MUST BE THE SAME 2 VISITORS [facility] STAY .  Remember: Instructions that are not followed completely may result in serious medical risk, up to and including death, or upon the discretion of your surgeon and anesthesiologist your surgery may need to be rescheduled.     _X__ 1. Do not eat food after midnight the night before your procedure.                 No gum chewing or hard candies. You may drink clear liquids up to 2 hours                 before you are scheduled to arrive for your surgery-                  . Diabetics water only  __X__2.  On the morning of surgery brush your teeth with toothpaste and water, you                 may rinse your mouth with mouthwash if you wish.  Do not swallow any              toothpaste of mouthwash.     _X__ 3.  No Alcohol for 24 hours before or after surgery.   _X__ 4.  Do Not Smoke or use e-cigarettes For 24 Hours Prior to Your Surgery.                 Do not use any chewable tobacco products for at least 6 hours prior to                 surgery.  ____  5.  Bring all medications with you on the day of surgery if instructed.   __X__  6.  Notify your doctor if there is any change in your medical condition      (cold, fever, infections).     Do not wear jewelry, make-up, hairpins, clips or nail polish. Do not wear lotions, powders, or perfumes. NO DEODARANT Do not shave 48 hours prior to surgery. Men may shave face and neck. Do not bring valuables to the hospital.    [facility] is not responsible for any belongings or valuables.  Contacts, dentures/partials or body piercings may not be worn into surgery. Bring a case for your contacts,  glasses or hearing aids, a denture cup will be supplied. Leave your suitcase in the car. After surgery it may be brought to your room. For patients admitted to the hospital, discharge time is determined by your treatment team.   Patients discharged the day of surgery will not be allowed to drive home.   Please read over the following fact sheets that you were given:   MRSA Information  __X__ Take these medicines the morning of surgery with A SIP OF WATER:    1. ALPRAZolam (XANAX) 0.5 MG tablet  2. busPIRone (BUSPAR) 30 MG tablet  3. famotidine (PEPCID) 20 MG tablet  4. FLUoxetine (PROZAC) 40 MG capsule  5. pregabalin (LYRICA) 300 MG capsule  6. rosuvastatin (CRESTOR) 10 MG tablet  ____ Fleet Enema (as directed)   __X__ Use CHG  Soap/SAGE wipes as directed  __X__ Use inhalers on the day of surgery NEBULIZER TREATMENT ALSO  ____ Stop metformin/Janumet/Farxiga 2 days prior to surgery    __X__ Take 1/2 of usual insulin dose the night before surgery. No insulin the morning          of surgery.   ____ Stop Blood Thinners Coumadin/Plavix/Xarelto/Pleta/Pradaxa/Eliquis/Effient/Aspirin  on   Or contact your Surgeon, Cardiologist or Medical Doctor regarding  ability to stop your blood thinners  __X__ Stop Anti-inflammatories 7 days before surgery such as Advil, Ibuprofen, Motrin,  BC or Goodies Powder, Naprosyn, Naproxen, Aleve, Aspirin    __X__ Stop all herbal supplements, fish oil or vitamin E until after surgery.    ____ Bring C-Pap to the hospital.

## 2019-11-15 NOTE — Pre-Procedure Instructions (Signed)
[name] at surgeons office notified of clearance request sent to patients endocrinologist.

## 2019-11-16 DIAGNOSIS — J449 Chronic obstructive pulmonary disease, unspecified: Secondary | ICD-10-CM | POA: Diagnosis not present

## 2019-11-18 ENCOUNTER — Other Ambulatory Visit
Admission: RE | Admit: 2019-11-18 | Discharge: 2019-11-18 | Disposition: A | Discharge status: Home / Self Care | Payer: BC Managed Care – PPO | Source: Ambulatory Visit | Attending: Surgery | Admitting: Surgery

## 2019-11-18 ENCOUNTER — Telehealth: Payer: Self-pay | Admitting: Endocrinology

## 2019-11-18 ENCOUNTER — Other Ambulatory Visit: Payer: Self-pay

## 2019-11-18 DIAGNOSIS — Z20822 Contact with and (suspected) exposure to covid-19: Secondary | ICD-10-CM | POA: Diagnosis not present

## 2019-11-18 DIAGNOSIS — Z01812 Encounter for preprocedural laboratory examination: Secondary | ICD-10-CM | POA: Diagnosis not present

## 2019-11-18 LAB — CBC WITH DIFFERENTIAL/PLATELET
Abs Immature Granulocytes: 0.1 10*3/uL — ABNORMAL HIGH (ref 0.00–0.07)
Basophils Absolute: 0.1 10*3/uL (ref 0.0–0.1)
Basophils Relative: 1 %
Eosinophils Absolute: 0.3 10*3/uL (ref 0.0–0.5)
Eosinophils Relative: 2 %
HCT: 40.2 % (ref 36.0–46.0)
Hemoglobin: 13.3 g/dL (ref 12.0–15.0)
Immature Granulocytes: 1 %
Lymphocytes Relative: 13 %
Lymphs Abs: 1.5 10*3/uL (ref 0.7–4.0)
MCH: 27.9 pg (ref 26.0–34.0)
MCHC: 33.1 g/dL (ref 30.0–36.0)
MCV: 84.5 fL (ref 80.0–100.0)
Monocytes Absolute: 0.8 10*3/uL (ref 0.1–1.0)
Monocytes Relative: 7 %
Neutro Abs: 9.2 10*3/uL — ABNORMAL HIGH (ref 1.7–7.7)
Neutrophils Relative %: 76 %
Platelets: 192 10*3/uL (ref 150–400)
RBC: 4.76 MIL/uL (ref 3.87–5.11)
RDW: 16.1 % — ABNORMAL HIGH (ref 11.5–15.5)
WBC: 12 10*3/uL — ABNORMAL HIGH (ref 4.0–10.5)
nRBC: 0 % (ref 0.0–0.2)

## 2019-11-18 LAB — COMPREHENSIVE METABOLIC PANEL
ALT: 34 U/L (ref 0–44)
AST: 31 U/L (ref 15–41)
Albumin: 4 g/dL (ref 3.5–5.0)
Alkaline Phosphatase: 104 U/L (ref 38–126)
Anion gap: 14 (ref 5–15)
BUN: 75 mg/dL — ABNORMAL HIGH (ref 6–20)
CO2: 30 mmol/L (ref 22–32)
Calcium: 9.4 mg/dL (ref 8.9–10.3)
Chloride: 86 mmol/L — ABNORMAL LOW (ref 98–111)
Creatinine, Ser: 1.47 mg/dL — ABNORMAL HIGH (ref 0.44–1.00)
GFR calc Af Amer: 48 mL/min — ABNORMAL LOW (ref >60)
GFR calc non Af Amer: 41 mL/min — ABNORMAL LOW (ref >60)
Glucose, Bld: 342 mg/dL — ABNORMAL HIGH (ref 70–99)
Potassium: 3.7 mmol/L (ref 3.5–5.1)
Sodium: 130 mmol/L — ABNORMAL LOW (ref 135–145)
Total Bilirubin: 0.9 mg/dL (ref 0.3–1.2)
Total Protein: 9 g/dL — ABNORMAL HIGH (ref 6.5–8.1)

## 2019-11-18 LAB — SARS CORONAVIRUS 2 (TAT 6-24 HRS): SARS Coronavirus 2: NEGATIVE

## 2019-11-18 LAB — HEMOGLOBIN A1C
Hgb A1c MFr Bld: 11.8 % — ABNORMAL HIGH (ref 4.8–5.6)
Mean Plasma Glucose: 291.96 mg/dL

## 2019-11-18 NOTE — Progress Notes (Signed)
Patient phoned stating she received message from her "diabetes doctor"  saying her surgery needs to be delayed. Patient upset because surgery is scheduled for 11/20/19 and she has already had COVID testing.  Passed information on to Dr. [name] in Breast Conference today.

## 2019-11-18 NOTE — Telephone Encounter (Signed)
Patient called office and has scheduled a surgical clearance/follow up appointment for tomorrow 11/19/2019 @ 1:00 PM

## 2019-11-18 NOTE — Telephone Encounter (Signed)
please contact patient: F/u is due, for surgical clearance

## 2019-11-18 NOTE — Telephone Encounter (Signed)
Called pt and advised that she will need to schedule an appt for clearance. Pt became hostile stating, "listen lady, I have already had my bloodwork and COVID clearance and now you won't clear me for surgery that is happening Wednesday." Advised pt it is unsafe to clear a pt without having seen them. Pt proceeded to tell me that she does not have a ride today or tomorrow and that the clearance MUST be done today or tomorrow. Advised we do not have any availabilities today nor tomorrow. Advised she call her surgeon's office to determine when they will need to reschedule her surgery since we cannot provide clearance BEFORE Wednesday. States she will call back.

## 2019-11-19 ENCOUNTER — Inpatient Hospital Stay: Payer: BC Managed Care – PPO | Admitting: Genetic Counselor

## 2019-11-19 ENCOUNTER — Ambulatory Visit (INDEPENDENT_AMBULATORY_CARE_PROVIDER_SITE_OTHER): Payer: Medicare HMO | Admitting: Endocrinology

## 2019-11-19 ENCOUNTER — Telehealth: Payer: Self-pay

## 2019-11-19 ENCOUNTER — Encounter: Payer: Self-pay | Admitting: Endocrinology

## 2019-11-19 DIAGNOSIS — E1165 Type 2 diabetes mellitus with hyperglycemia: Secondary | ICD-10-CM

## 2019-11-19 MED ORDER — HUMULIN R U-500 (CONCENTRATED) 500 UNIT/ML ~~LOC~~ SOLN: 450.0000 [IU] | Freq: Two times a day (BID) | SUBCUTANEOUS | 11 refills | Status: DC

## 2019-11-19 NOTE — Progress Notes (Signed)
Subjective:    Patient ID: [name], female    DOB: 1970-12-17, 49 y.o.   MRN: [id number]  HPI Pt returns for f/u of diabetes mellitus: DM type: Insulin-requiring type 2 Dx'ed: 1988.  Complications: polyneuropathy, TIA, renal insuff, and DR.   Therapy: insulin since soon after dx.   GDM: never DKA: never Severe hypoglycemia: last episode was mid-2018.   Pancreatitis: never Pancreatic imaging: normal on 2011 CT.   SDOH: pt says she cannot afford copay for GLP med Other: she takes multiple daily injections; she takes U-500, due to severe insulin resistance.  She does not eat lunch, so she does not take insulin then. Interval history: pt states cbg's vary from 102-400.  It is in general higher as the day goes on.  Pt says she never misses the insulin.  She is overdue for f/u.  Surgery was delayed, pending improved glycemic control.   Past Medical History:  Diagnosis Date  . Anxiety    takes Prozac daily  . Anxiety   . Aortic valve calcification   . Asthma    Advair and Spirva daily  . Asthma   . Bipolar disorder ([place])   . CAD (coronary artery disease)    a. LHC 11/2013 done for CP/fluid retention: mild disease in prox LAD, mild-mod disease in mRCA, EF 60% with normal LVEDP. b. Normal nuc 03/2016.  Marland Kitchen CHF (congestive heart failure) ([place])   . Chronic diastolic CHF (congestive heart failure) ([place])   . Chronic heart failure with preserved ejection fraction ([place]) 11/16/2018  . CKD (chronic kidney disease), stage II   . COPD (chronic obstructive pulmonary disease) ([facility])    a. nocturnal O2.  Marland Kitchen COPD (chronic obstructive pulmonary disease) ([place])   . Coronary artery disease   . Decreased urine stream   . Diabetes mellitus   . Diabetes mellitus without complication ([place])   . Dyspnea   . Family history of adverse reaction to anesthesia    mom gets nauseated  . GERD (gastroesophageal reflux disease)    takes Pepcid daily  . History of blood clots    left leg 3-60yrs ago  .  Hyperlipidemia   . Hypertension   . Hypertriglyceridemia   . Inguinal hernia, left 01/2015  . Muscle spasm   . Open wound of genital labia   . Peripheral neuropathy   . RBBB   . Seizures ([place])   . Sepsis ([place]) 01/19/2018  . Sinus tachycardia    a. persistent since 2009.  Marland Kitchen Smokers' cough ([place])   . Stroke [facility]) 1989   left sided weakness  . TIA (transient ischemic attack)   . Tobacco abuse   . Vulvar abscess 01/23/2018    Past Surgical History:  Procedure Laterality Date  . BREAST BIOPSY Right 11/06/2019   [place] core path pending venus clip  . HERNIA REPAIR    . INCISION AND DRAINAGE ABSCESS N/A 01/26/2018   Procedure: INCISION AND DEBRIDEMENT OF VULVAR NECROTIZING SOFT TISSUE INFECTION;  Surgeon: Greer Pickerel, MD;  Location: [place];  Service: General;  Laterality: N/A;  . INCISION AND DRAINAGE PERIRECTAL ABSCESS N/A 01/22/2018   Procedure: IRRIGATION AND DEBRIDEMENT LABIAL/VULVAR AREA;  Surgeon: Coralie Keens, MD;  Location: [place];  Service: General;  Laterality: N/A;  . INCISION AND DRAINAGE PERIRECTAL ABSCESS N/A 01/29/2018   Procedure: IRRIGATION AND DEBRIDEMENT VULVA;  Surgeon: Excell Seltzer, MD;  Location: [facility];  Service: General;  Laterality: N/A;  . INGUINAL HERNIA REPAIR Left 04/08/2015   Procedure: OPEN  LEFT INGUINAL HERNIA REPAIR WITH MESH;  Surgeon: Ralene Ok, MD;  Location: [facility];  Service: General;  Laterality: Left;  . INSERTION OF MESH Left 04/08/2015   Procedure: INSERTION OF MESH;  Surgeon: Ralene Ok, MD;  Location: [facility];  Service: General;  Laterality: Left;  . LAPAROSCOPY     Endometriosis  . LEFT HEART CATHETERIZATION WITH CORONARY ANGIOGRAM N/A 12/05/2013   Procedure: LEFT HEART CATHETERIZATION WITH CORONARY ANGIOGRAM;  Surgeon: Jettie Booze, MD;  Location: [facility] CATH LAB;  Service: Cardiovascular;  Laterality: N/A;  . right kidney drained    . TEE WITHOUT CARDIOVERSION N/A 11/28/2013   Procedure: TRANSESOPHAGEAL ECHOCARDIOGRAM (TEE);   Surgeon: Thayer Headings, MD;  Location: [facility] ENDOSCOPY;  Service: Cardiovascular;  Laterality: N/A;    Social History   Socioeconomic History  . Marital status: Married    Spouse name: Not on file  . Number of children: 2  . Years of education: Not on file  . Highest education level: Not on file  Occupational History  . Not on file  Tobacco Use  . Smoking status: Current Every Day Smoker    Packs/day: 1.00    Years: 36.00    Pack years: 36.00    Types: Cigarettes    Start date: 07/11/1981  . Smokeless tobacco: Never Used  . Tobacco comment: 1 ppd now  Substance and Sexual Activity  . Alcohol use: No  . Drug use: No  . Sexual activity: Not Currently  Other Topics Concern  . Not on file  Social History Narrative   ** Merged History Encounter **       Lives in [place]   Married but lives with Boyfriend - not legally separated   Disabled - for BiPolar, Seizure disorder, diabetes   Formerly worked at [employer] 1 ppd; no alcohol. 2 step sons; no biologic children.       Social Determinants of Health   Financial Resource Strain:   . Difficulty of Paying Living Expenses:   Food Insecurity:   . Worried About Charity fundraiser in the Last Year:   . Arboriculturist in the Last Year:   Transportation Needs:   . Film/video editor (Medical):   Marland Kitchen Lack of Transportation (Non-Medical):   Physical Activity:   . Days of Exercise per Week:   . Minutes of Exercise per Session:   Stress:   . Feeling of Stress :   Social Connections:   . Frequency of Communication with Friends and Family:   . Frequency of Social Gatherings with Friends and Family:   . Attends Religious Services:   . Active Member of Clubs or Organizations:   . Attends Archivist Meetings:   Marland Kitchen Marital Status:   Intimate Partner Violence:   . Fear of Current or Ex-Partner:   . Emotionally Abused:   Marland Kitchen Physically Abused:   . Sexually Abused:     Current Outpatient Medications on File  Prior to Visit  Medication Sig Dispense Refill  . acetaminophen (TYLENOL) 500 MG tablet Take 1,000 mg by mouth every 4 (four) hours as needed for moderate pain.     Marland Kitchen albuterol (VENTOLIN HFA) 108 (90 Base) MCG/ACT inhaler Inhale 2 puffs into the lungs every 6 (six) hours as needed for wheezing or shortness of breath.    . ALPRAZolam (XANAX) 0.5 MG tablet Take 1 tablet (0.5 mg total) by mouth 2 (two) times daily as needed for anxiety. (  Patient taking differently: Take 0.5 mg by mouth 3 (three) times daily. )  0  . aspirin EC 81 MG tablet Take 81 mg by mouth every morning.    . budesonide-formoterol (SYMBICORT) 80-4.5 MCG/ACT inhaler INHALE 2 PUFFS BY MOUTH EVERY 12 HOURS TO PREVENT COUGH OR WHEEZING *RINSE MOUTH AFTER EACH USE* (Patient taking differently: Inhale 2 puffs into the lungs in the morning and at bedtime. ) 10.2 g 3  . busPIRone (BUSPAR) 30 MG tablet Take 30 mg by mouth 2 (two) times daily.    . famotidine (PEPCID) 20 MG tablet TAKE 1 TABLET (20 MG TOTAL) BY MOUTH 2 (TWO) TIMES DAILY. FOR HEARTBURN. (Patient taking differently: Take 20 mg by mouth 2 (two) times daily. ) 180 tablet 1  . FLUoxetine (PROZAC) 40 MG capsule Take 40 mg by mouth daily.     Marland Kitchen glucose blood (ONETOUCH VERIO) test strip 1 each by Other route 3 (three) times daily. E11.9; E10.42 100 each 2  . Insulin Pen Needle 32G X 4 MM MISC Used to give insulin injections twice daily. 100 each 11  . Insulin Syringe-Needle U-100 (INSULIN SYRINGE 1CC/30GX1/2") 30G X 1/2" 1 ML MISC 1 each by Does not apply route 2 (two) times daily. E11.9 200 each 0  . ipratropium-albuterol (DUONEB) 0.5-2.5 (3) MG/3ML SOLN Take 3 mLs by nebulization every 4 (four) hours as needed (wheezing/shortness of breath). 360 mL 0  . metolazone (ZAROXOLYN) 5 MG tablet Take 5 mg by mouth daily.     Glory Rosebush Delica Lancets [id number] MISC 1 each by Does not apply route 3 (three) times daily. Use to monitor glucose levels three times daily; E11.9; E10.42 100 each 11  .  OXYGEN Inhale 2-4 L into the lungs at bedtime.     . potassium chloride 20 MEQ TBCR Take 20 mEq by mouth daily. 60 tablet 2  . pregabalin (LYRICA) 300 MG capsule Take 1 capsule (300 mg total) by mouth 2 (two) times daily. For neuropathy. 180 capsule 0  . rOPINIRole (REQUIP) 1 MG tablet TAKE 1 TABLET BY MOUTH EVERY NIGHT AT BEDTIME FOR RESTLESS LEGS. (Patient taking differently: Take 1 mg by mouth at bedtime. ) 90 tablet 1  . spironolactone (ALDACTONE) 50 MG tablet TAKE 1 TABLET BY MOUTH ONCE A DAY (Patient taking differently: Take 50 mg by mouth daily. ) 90 tablet 1  . torsemide (DEMADEX) 20 MG tablet TAKE 1 TABLET BY MOUTH 4 TIMES DAILY (Patient taking differently: Take 20 mg by mouth 2 (two) times daily. ) 360 tablet 3  . traZODone (DESYREL) 100 MG tablet Take 200 mg by mouth at bedtime.     Marland Kitchen ULTICARE MINI PEN NEEDLES 31G X 6 MM MISC USE AS DIRECTED THREE TIMES DAILY 100 each 0  . rosuvastatin (CRESTOR) 10 MG tablet Take 1 tablet (10 mg total) by mouth daily. 90 tablet 3   No current facility-administered medications on file prior to visit.    Allergies  Allergen Reactions  . Adhesive [Tape] Rash and Other (See Comments)    TAKES OFF THE SKIN (CERTAIN MEDICAL TAPES DO THIS!!)  . Metoprolol Shortness Of Breath    Occurrence of shortness of breath after 3 days  . Montelukast Shortness Of Breath  . Morphine Sulfate Anaphylaxis, Shortness Of Breath and Nausea And Vomiting    Swollen Throat - Able to tolerate dilaudid  . Penicillins Anaphylaxis, Hives and Shortness Of Breath    Throat swells Has patient had a PCN reaction causing immediate rash, facial/tongue/throat  swelling, SOB or lightheadedness with hypotension: Yes Has patient had a PCN reaction causing severe rash involving mucus membranes or skin necrosis: No Has patient had a PCN reaction that required hospitalization: Yes Has patient had a PCN reaction occurring within the last 10 years: No If all of the above answers are "NO",  then may proceed with Cephalosporin use.   . Prednisone Anaphylaxis  . Diltiazem Swelling  . Gabapentin Swelling    Family History  Problem Relation Age of Onset  . Venous thrombosis Brother   . Other Brother        BRAIN TUMOR  . Asthma Father   . Diabetes Father   . Coronary artery disease Mother   . Hypertension Mother   . Diabetes Mother   . Breast cancer Mother 21  . Asthma Sister   . Diabetes type II Brother     BP 120/60   Pulse 84   Ht 5\' 4"  (1.626 m)   Wt 285 lb (129.3 kg)   LMP 11/01/2019 (Approximate)   SpO2 91%   BMI 48.92 kg/m    Review of Systems She denies hypoglycemia    Objective:   Physical Exam VITAL SIGNS:  See vs page GENERAL: no distress.  In wheelchair.   Pulses: dorsalis pedis intact bilat.   MSK: no deformity of the feet CV: 1+ bilat leg edema  Skin:  no ulcer on the feet, but the skin is very dry and scaly.  normal color and temp on the feet.  Neuro: sensation is intact to touch on the feet, but severely decreased from normal.   Ext: There is severe bilateral onychomycosis of the toenails.   Lab Results  Component Value Date   HGBA1C 11.8 (H) 11/18/2019      Assessment & Plan:  Insulin-requiring type 2 DM: worse.   Severe insulin resistance.  She declines to change to [place].    Patient Instructions  Please increase the insulin to 450 units twice a day (just before 2 meals). check your blood sugar once a day.  vary the time of day when you check, between before the 3 meals, and at bedtime.  also check if you have symptoms of your blood sugar being too high or too low.  please keep a record of the readings and bring it to your next appointment here (or you can bring the meter itself).  You can write it on any piece of paper.  please call us sooner if your blood sugar goes below 70, or if you have a lot of readings over 200. Please come back for a follow-up appointment in 1-2 weeks.

## 2019-11-19 NOTE — Telephone Encounter (Signed)
Spoke with Dr Bethanne Ginger office at [facility]. They will not clear the patient for surgery at this time. She is currently scheduled to return to see them on 11/25/19. Dr Christian Mate is aware and had already canceled the patient's surgery for now until we can get her clearance.

## 2019-11-19 NOTE — Progress Notes (Signed)
Patient phoned and she is seeing Dr. Loanne Drilling today.  Per [facility], she will also need cardiac clearance prior to surgery being scheduled.

## 2019-11-19 NOTE — Patient Instructions (Signed)
Please increase the insulin to 450 units twice a day (just before 2 meals). check your blood sugar once a day.  vary the time of day when you check, between before the 3 meals, and at bedtime.  also check if you have symptoms of your blood sugar being too high or too low.  please keep a record of the readings and bring it to your next appointment here (or you can bring the meter itself).  You can write it on any piece of paper.  please call us sooner if your blood sugar goes below 70, or if you have a lot of readings over 200. Please come back for a follow-up appointment in 1-2 weeks.

## 2019-11-20 ENCOUNTER — Ambulatory Visit: Admission: RE | Admit: 2019-11-20 | Payer: Medicare HMO | Source: Home / Self Care | Admitting: Surgery

## 2019-11-20 ENCOUNTER — Encounter: Admission: RE | Payer: Self-pay | Source: Home / Self Care

## 2019-11-20 ENCOUNTER — Ambulatory Visit: Payer: Medicare HMO

## 2019-11-20 SURGERY — SIMPLE MASTECTOMY WITH AXILLARY SENTINEL NODE BIOPSY
Anesthesia: General | Laterality: Right

## 2019-11-25 DIAGNOSIS — N182 Chronic kidney disease, stage 2 (mild): Secondary | ICD-10-CM | POA: Diagnosis not present

## 2019-11-25 DIAGNOSIS — I10 Essential (primary) hypertension: Secondary | ICD-10-CM | POA: Diagnosis not present

## 2019-11-25 DIAGNOSIS — I509 Heart failure, unspecified: Secondary | ICD-10-CM | POA: Diagnosis not present

## 2019-11-25 DIAGNOSIS — I2510 Atherosclerotic heart disease of native coronary artery without angina pectoris: Secondary | ICD-10-CM | POA: Diagnosis not present

## 2019-11-25 DIAGNOSIS — I6521 Occlusion and stenosis of right carotid artery: Secondary | ICD-10-CM | POA: Diagnosis not present

## 2019-11-25 DIAGNOSIS — I5032 Chronic diastolic (congestive) heart failure: Secondary | ICD-10-CM | POA: Diagnosis not present

## 2019-11-26 ENCOUNTER — Telehealth: Payer: Self-pay | Admitting: Emergency Medicine

## 2019-11-26 NOTE — Telephone Encounter (Signed)
Cardiac clearance received from Dr Ubaldo Glassing.

## 2019-11-26 NOTE — Telephone Encounter (Signed)
Cardiac clearance received from Dr Ubaldo Glassing. Pt is optimized for surgery at medium risk.

## 2019-11-26 NOTE — Progress Notes (Signed)
Patient phoned stating cardiology, and endocrinology clearance for surgery approved.  Notified Dr. Forest Becker staff.  They are waiting for verication to schedule surgery.

## 2019-11-27 ENCOUNTER — Encounter: Payer: Self-pay | Admitting: Emergency Medicine

## 2019-11-27 ENCOUNTER — Telehealth: Payer: Self-pay | Admitting: Emergency Medicine

## 2019-11-27 ENCOUNTER — Emergency Department: Payer: BC Managed Care – PPO

## 2019-11-27 ENCOUNTER — Other Ambulatory Visit: Payer: Self-pay

## 2019-11-27 ENCOUNTER — Emergency Department
Admission: EM | Admit: 2019-11-27 | Discharge: 2019-11-27 | Disposition: A | Discharge status: Home / Self Care | Payer: BC Managed Care – PPO | Attending: Emergency Medicine | Admitting: Emergency Medicine

## 2019-11-27 DIAGNOSIS — R0602 Shortness of breath: Secondary | ICD-10-CM | POA: Diagnosis not present

## 2019-11-27 DIAGNOSIS — R079 Chest pain, unspecified: Secondary | ICD-10-CM | POA: Insufficient documentation

## 2019-11-27 DIAGNOSIS — R609 Edema, unspecified: Secondary | ICD-10-CM | POA: Diagnosis not present

## 2019-11-27 DIAGNOSIS — Z5321 Procedure and treatment not carried out due to patient leaving prior to being seen by health care provider: Secondary | ICD-10-CM | POA: Diagnosis not present

## 2019-11-27 DIAGNOSIS — E1165 Type 2 diabetes mellitus with hyperglycemia: Secondary | ICD-10-CM | POA: Diagnosis not present

## 2019-11-27 DIAGNOSIS — R062 Wheezing: Secondary | ICD-10-CM | POA: Diagnosis not present

## 2019-11-27 DIAGNOSIS — R600 Localized edema: Secondary | ICD-10-CM | POA: Insufficient documentation

## 2019-11-27 DIAGNOSIS — I959 Hypotension, unspecified: Secondary | ICD-10-CM | POA: Diagnosis not present

## 2019-11-27 LAB — BRAIN NATRIURETIC PEPTIDE: B Natriuretic Peptide: 68.4 pg/mL (ref 0.0–100.0)

## 2019-11-27 LAB — CBC WITH DIFFERENTIAL/PLATELET
Abs Immature Granulocytes: 0.2 10*3/uL — ABNORMAL HIGH (ref 0.00–0.07)
Basophils Absolute: 0.1 10*3/uL (ref 0.0–0.1)
Basophils Relative: 1 %
Eosinophils Absolute: 0.3 10*3/uL (ref 0.0–0.5)
Eosinophils Relative: 2 %
HCT: 37.8 % (ref 36.0–46.0)
Hemoglobin: 12.2 g/dL (ref 12.0–15.0)
Immature Granulocytes: 2 %
Lymphocytes Relative: 12 %
Lymphs Abs: 1.4 10*3/uL (ref 0.7–4.0)
MCH: 27.6 pg (ref 26.0–34.0)
MCHC: 32.3 g/dL (ref 30.0–36.0)
MCV: 85.5 fL (ref 80.0–100.0)
Monocytes Absolute: 0.8 10*3/uL (ref 0.1–1.0)
Monocytes Relative: 6 %
Neutro Abs: 9.4 10*3/uL — ABNORMAL HIGH (ref 1.7–7.7)
Neutrophils Relative %: 77 %
Platelets: 196 10*3/uL (ref 150–400)
RBC: 4.42 MIL/uL (ref 3.87–5.11)
RDW: 16.8 % — ABNORMAL HIGH (ref 11.5–15.5)
WBC: 12.2 10*3/uL — ABNORMAL HIGH (ref 4.0–10.5)
nRBC: 0 % (ref 0.0–0.2)

## 2019-11-27 LAB — COMPREHENSIVE METABOLIC PANEL
ALT: 35 U/L (ref 0–44)
AST: 34 U/L (ref 15–41)
Albumin: 3.7 g/dL (ref 3.5–5.0)
Alkaline Phosphatase: 116 U/L (ref 38–126)
Anion gap: 12 (ref 5–15)
BUN: 57 mg/dL — ABNORMAL HIGH (ref 6–20)
CO2: 26 mmol/L (ref 22–32)
Calcium: 8.8 mg/dL — ABNORMAL LOW (ref 8.9–10.3)
Chloride: 95 mmol/L — ABNORMAL LOW (ref 98–111)
Creatinine, Ser: 1.79 mg/dL — ABNORMAL HIGH (ref 0.44–1.00)
GFR calc Af Amer: 38 mL/min — ABNORMAL LOW (ref >60)
GFR calc non Af Amer: 32 mL/min — ABNORMAL LOW (ref >60)
Glucose, Bld: 407 mg/dL — ABNORMAL HIGH (ref 70–99)
Potassium: 4.4 mmol/L (ref 3.5–5.1)
Sodium: 133 mmol/L — ABNORMAL LOW (ref 135–145)
Total Bilirubin: 1 mg/dL (ref 0.3–1.2)
Total Protein: 8.8 g/dL — ABNORMAL HIGH (ref 6.5–8.1)

## 2019-11-27 LAB — TROPONIN I (HIGH SENSITIVITY): Troponin I (High Sensitivity): 10 ng/L (ref <18)

## 2019-11-27 IMAGING — CT CT HEAD CODE STROKE W/O CM
3 series · 15 of 47 positions shown, 18 images · non-contrast
Comparison: Head CT 09/14/2018

CLINICAL DATA: Code stroke. Focal neuro deficit, greater than 6
hours, stroke suspected, possible stroke. Slurred speech, right
facial droop, left-sided weakness.

EXAM:
CT HEAD WITHOUT CONTRAST
TECHNIQUE: Contiguous axial images were obtained from the base of the skull
through the vertex without intravenous contrast.

[Series 3: head 5.0 st · axial · 0.42mm/px · z∈[-83,+47]mm · 9 of 32 slices shown, 12 images]
[im 3/32  brain]
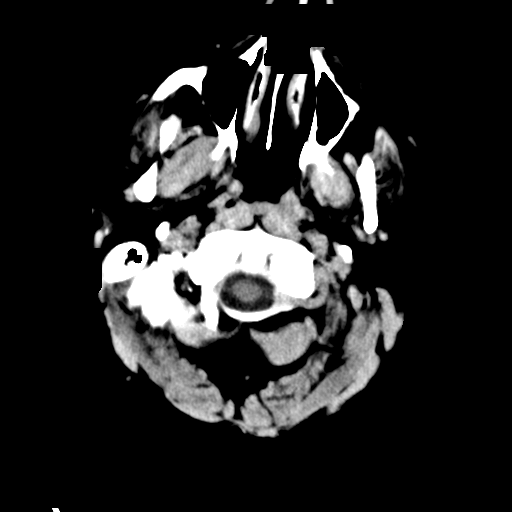
[im 3/32  bone]
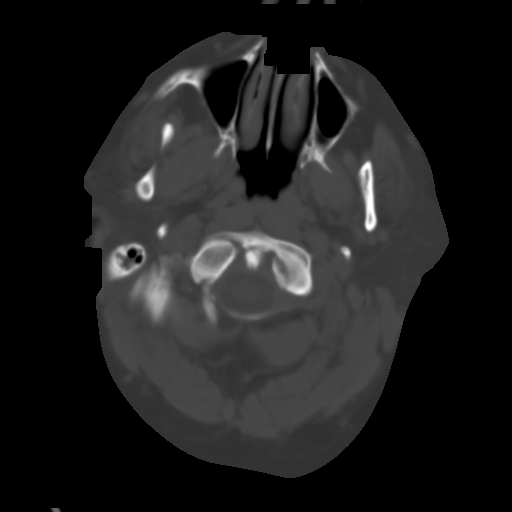
[im 6/32  brain]
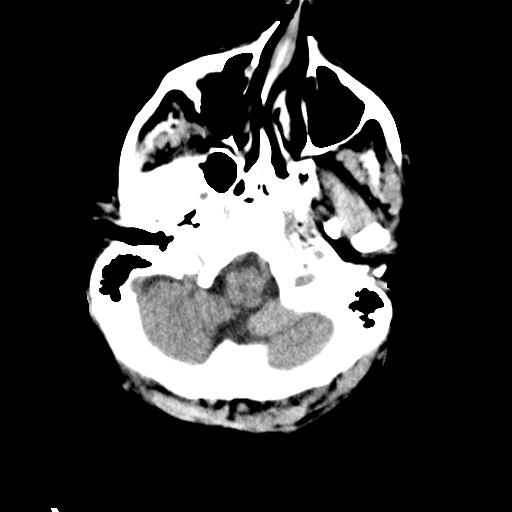
[im 9/32  brain]
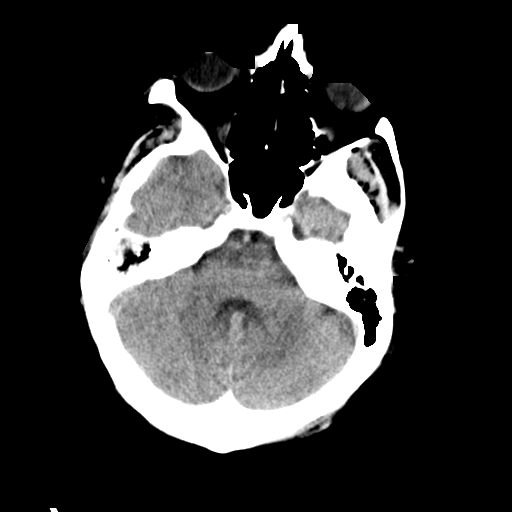
[im 12/32  brain]
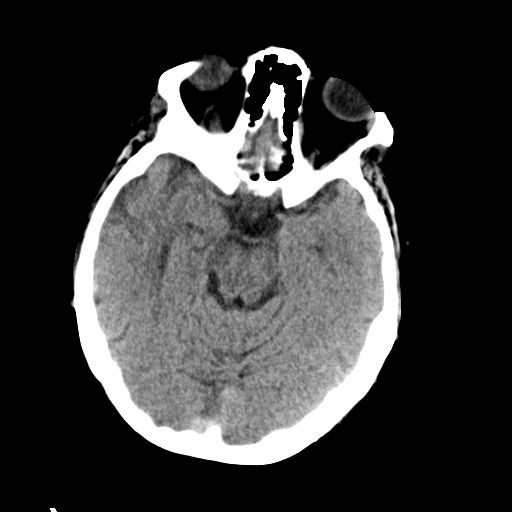
[im 17/32  brain]
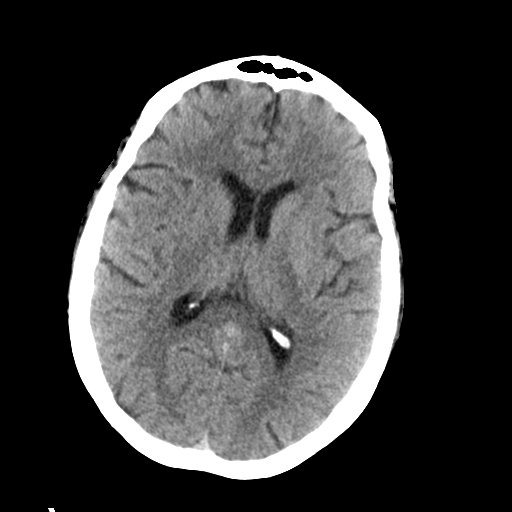
[im 17/32  bone]
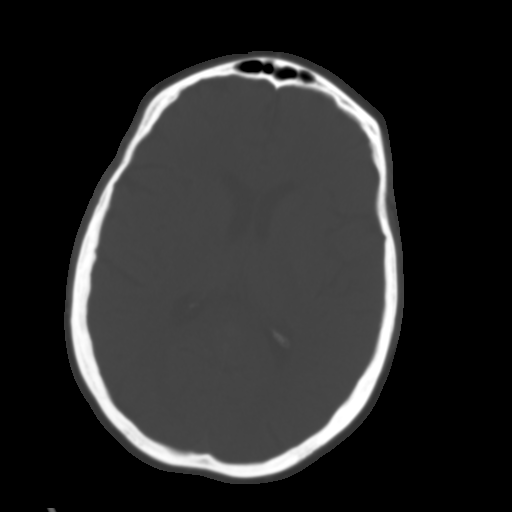
[im 20/32  brain]
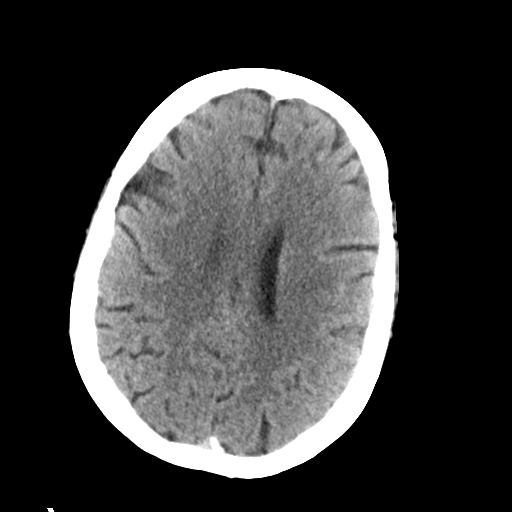
[im 23/32  brain]
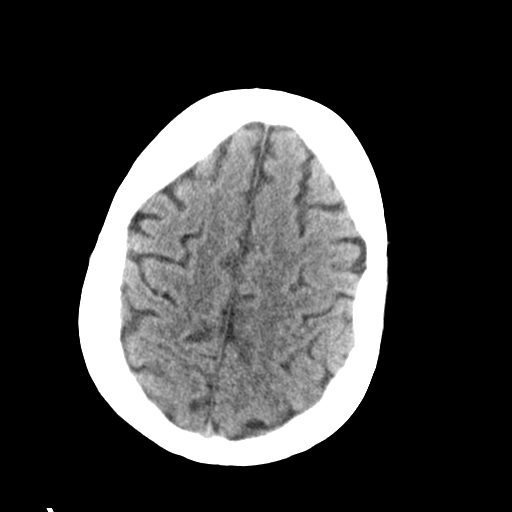
[im 26/32  brain]
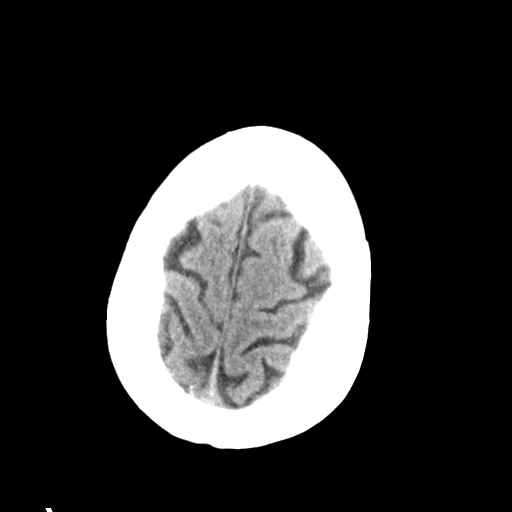
[im 29/32  brain]
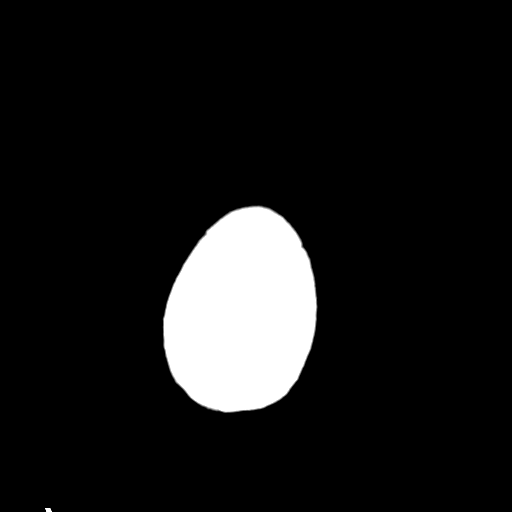
[im 29/32  bone]
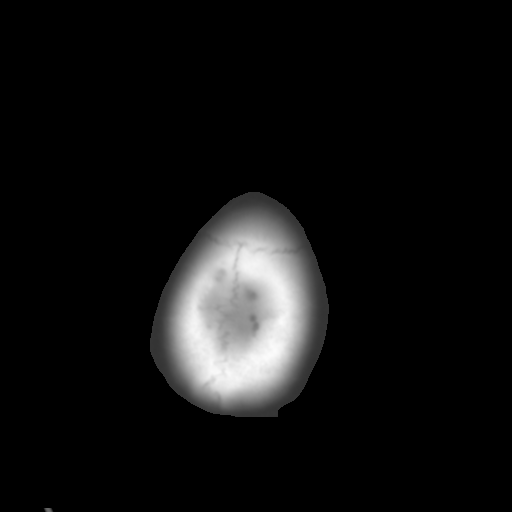

[Series 5: head 3.0 cor st · coronal · 0.31mm/px · 3 of 67 slices shown]
[im 23/67  brain]
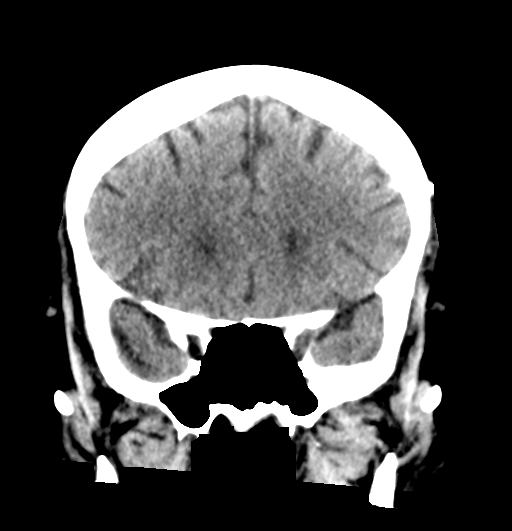
[im 30/67  brain]
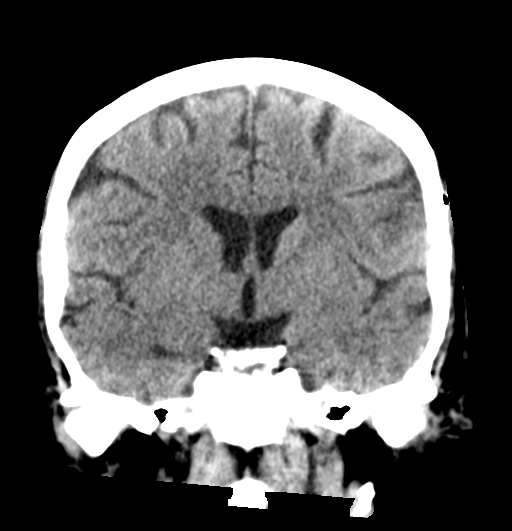
[im 37/67  brain]
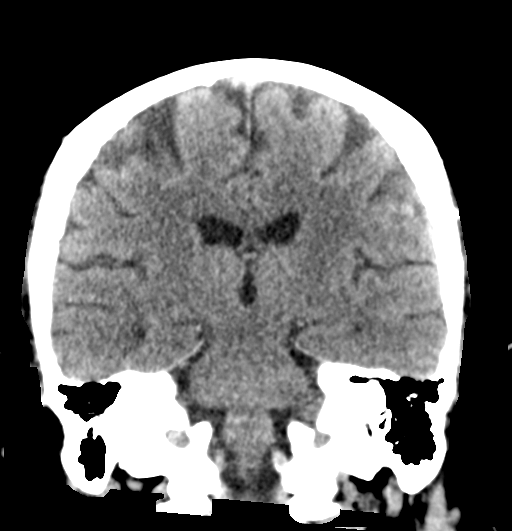

[Series 6: head 3.0 sag st · sagittal · 0.30mm/px · 3 of 50 slices shown]
[im 17/50  brain]
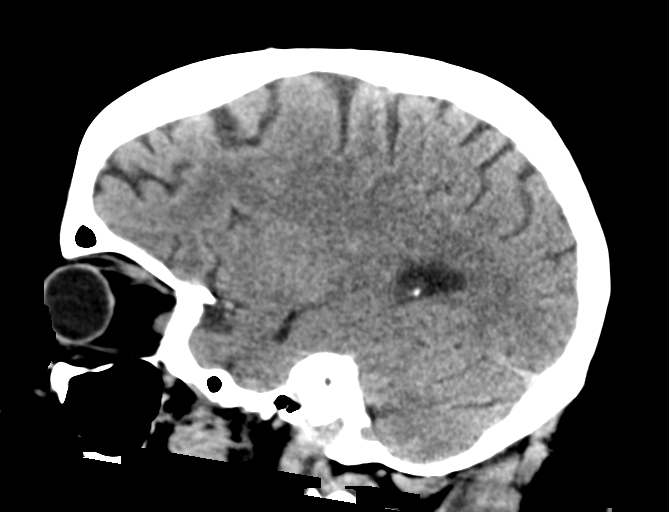
[im 25/50  brain]
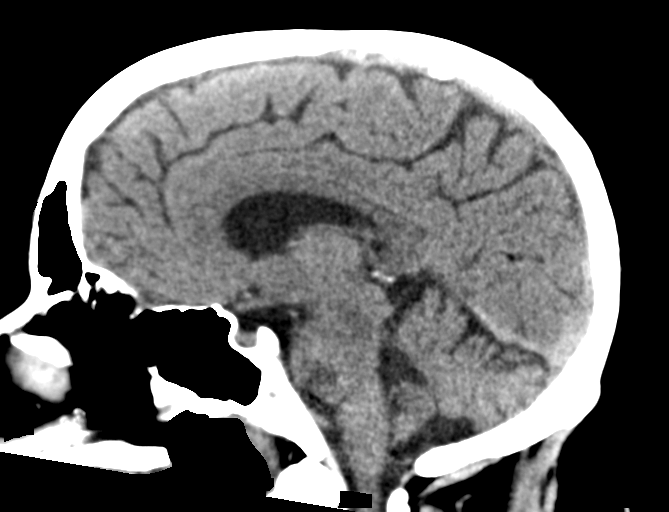
[im 33/50  brain]
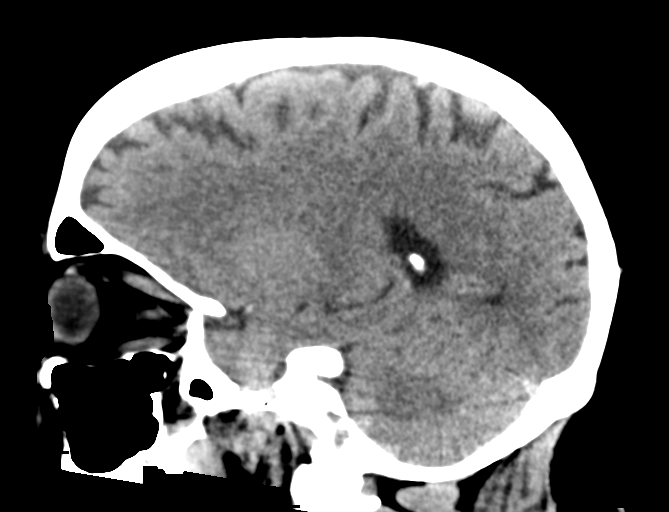

[15 of 47 positions shown; findings below may reference images not displayed]

FINDINGS: Brain: There is no acute intracranial hemorrhage or demarcated
territorial infarction. No evidence of intracranial mass. No midline
shift or extra-axial collection. Cerebral volume is normal.

Vascular: No hyperdense vessel. Atherosclerotic calcification of the
carotid siphons and vertebrobasilar system.

Skull: No calvarial fracture

Sinuses/Orbits: Imaged globes and orbits demonstrate no acute
abnormality. No significant paranasal sinus disease or mastoid
effusion.

Other: Small metallic foreign body within the left frontotemporal
scalp soft tissues

ASPECTS (Alberta Stroke Program Early CT Score)

- Ganglionic level infarction (caudate, lentiform nuclei, internal
capsule, insula, M1-M3 cortex): 7

- Supraganglionic infarction (M4-M6 cortex): 3

Total score (0-10 with 10 being normal): 10

These results were called by telephone at the time of interpretation
on 02/27/2019 at [DATE] to Dr. Markas, who verbally acknowledged
these results.
IMPRESSION: No acute intracranial hemorrhage or demarcated territorial
infarction. ASPECTS 10.

## 2019-11-27 IMAGING — CT CT ANGIOGRAPHY HEAD
3 of 7 series · 10 of 36 positions shown · IV contrast (OMNI 350)
Comparison: Concurrent noncontrast head CT, head CT 09/14/2018.

CLINICAL DATA: Code stroke, slurred speech, right facial droop,
left-sided weakness.

EXAM:
CT ANGIOGRAPHY HEAD AND NECK
TECHNIQUE: Multidetector CT imaging of the head and neck was performed using
the standard protocol during bolus administration of intravenous
contrast. Multiplanar CT image reconstructions and MIPs were
obtained to evaluate the vascular anatomy. Carotid stenosis
measurements (when applicable) are obtained utilizing NASCET
criteria, using the distal internal carotid diameter as the
denominator.
CONTRAST:  75mL OMNIPAQUE IOHEXOL 350 MG/ML SOLN

[Series 6: cta neck · axial · 0.45mm/px · z∈[-167,-61]mm · 2 of 159 slices shown]
[im 53/159  soft-tissue]
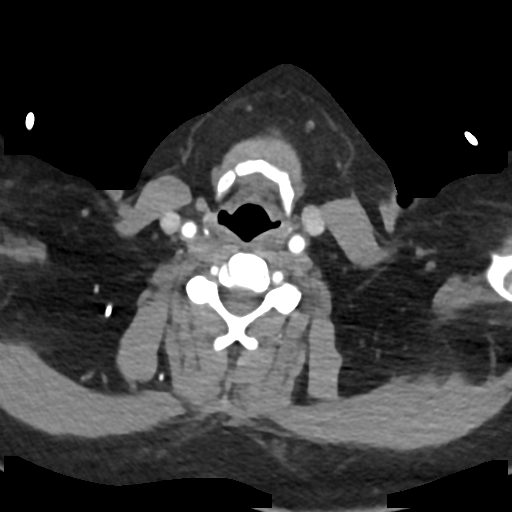
[im 106/159  soft-tissue]
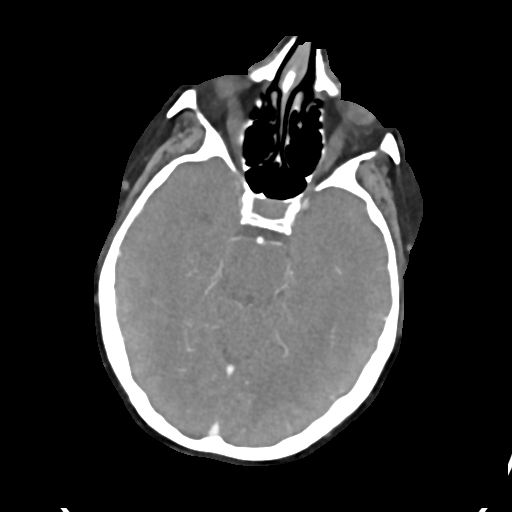

[Series 8: cta neck axial · axial · 0.39mm/px · z∈[-247,-25]mm · 6 of 317 slices shown]
[im 46/317  soft-tissue]
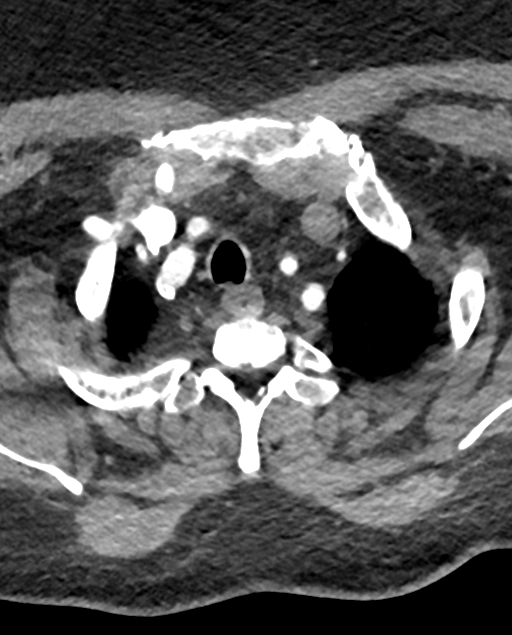
[im 91/317  bone]
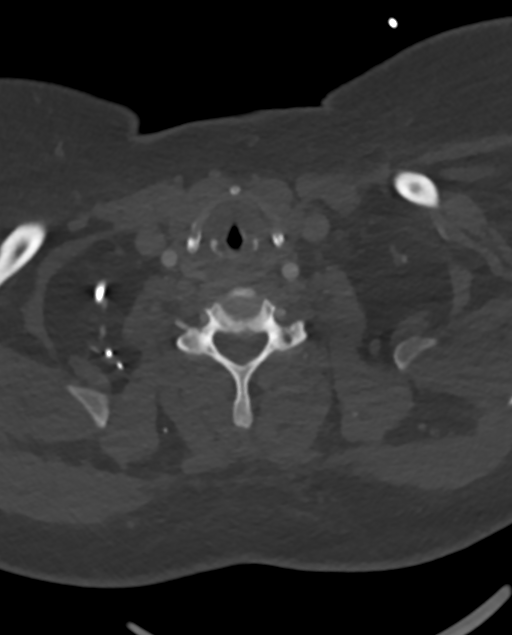
[im 136/317  soft-tissue]
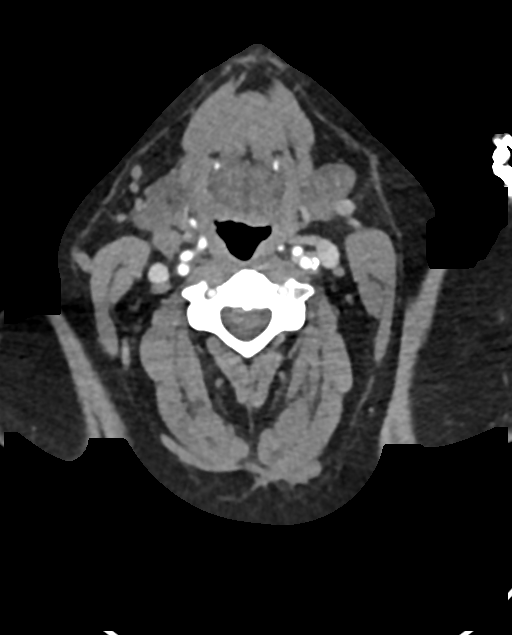
[im 181/317  bone]
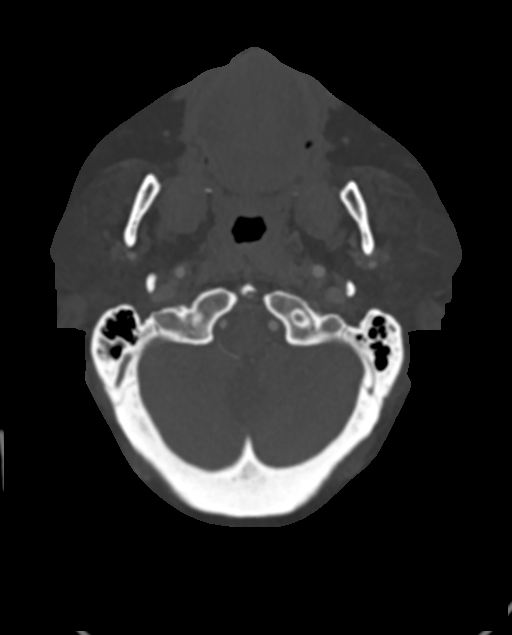
[im 226/317  soft-tissue]
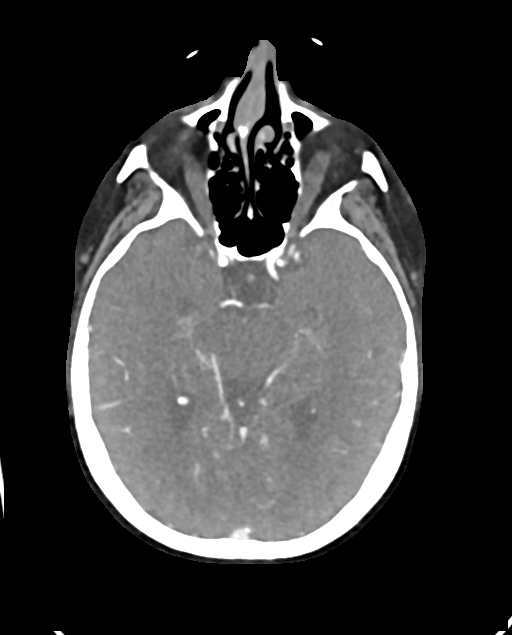
[im 271/317  bone]
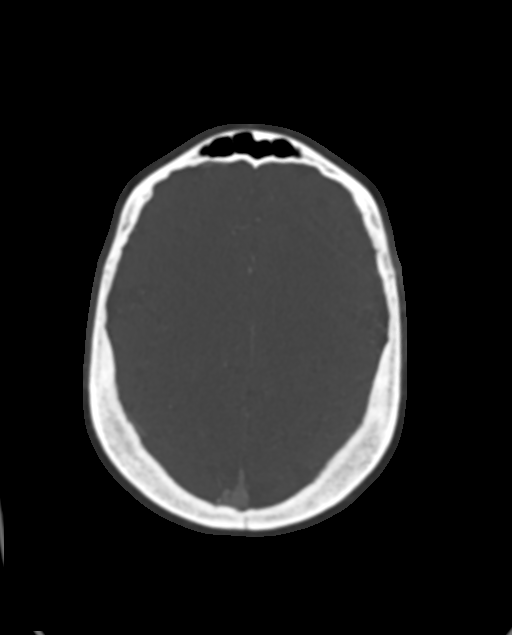

[Series 10: cta neck sagittal · sagittal · 0.41mm/px · 2 of 201 slices shown]
[im 33/201  soft-tissue]
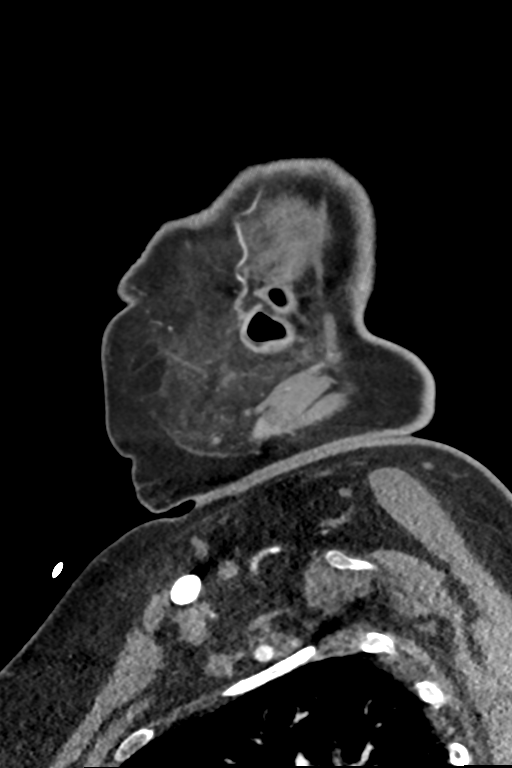
[im 169/201  soft-tissue]
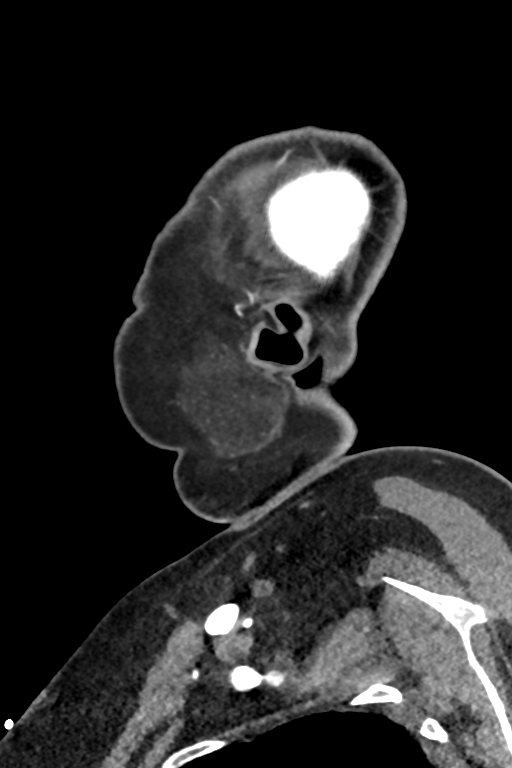

[10 of 36 positions shown; findings below may reference images not displayed]

FINDINGS: CTA NECK FINDINGS

Aortic arch: Standard branching. Scattered atherosclerotic
calcification within the visualized aortic arch and major branch
vessels. No high-grade narrowing of the major branch vessel origins.

Right carotid system: The right common and cervical internal carotid
arteries are patent. Atherosclerotic plaque within the right carotid
bifurcation and proximal cervical right internal carotid artery.
Narrowing of the proximal cervical right ICA with minimal diameter
of 2.4 mm. When compared with the normal diameter of the more distal
cervical ICA of 3.8, this corresponds with an estimated stenosis of
approximately 40%%.

Left carotid system: Atherosclerotic plaque at the left carotid
bifurcation and within the proximal left cervical internal carotid
artery without significant stenosis (50% or greater).

Vertebral arteries: The bilateral cervical vertebral arteries are
patent within the neck without significant stenosis (50% or
greater).

Skeleton: No suspicious lytic or blastic osseous lesions.

Other neck: 1.5 cm right thyroid lobe nodule with small associated
focus of calcification. No soft tissue neck mass or pathologically
enlarged cervical chain lymph nodes.

Upper chest: Patchy ground-glass opacity within the imaged lung
apices.

Review of the MIP images confirms the above findings

CTA HEAD FINDINGS

Anterior circulation: Calcified and noncalcified atheromatous plaque
within the bilateral intracranial internal carotid siphons with
regions of mild luminal narrowing. The right middle and anterior
cerebral arteries are patent without significant proximal stenosis.
The left anterior middle cerebral arteries are patent without
significant proximal stenosis. No anterior circulation aneurysm is
identified.

Posterior circulation: The distal vertebral arteries are patent
without significant stenosis. The basilar artery is patent without
significant stenosis. The bilateral posterior cerebral arteries are
patent without significant proximal stenosis. No posterior
circulation aneurysm is identified.

Venous sinuses: Within the limitations of contrast timing, no
evidence of thrombosis.

Anatomic variants: Posterior communicating arteries are hypoplastic
or absent bilaterally.

Review of the MIP images confirms the above findings

These results were communicated to Dr. Telada At [DATE] pmon
02/27/2019by text page via the AMION messaging system.
IMPRESSION: CTA HEAD:

No emergent large vessel occlusion.

Atherosclerotic disease with regions of mild luminal narrowing in
the bilateral carotid siphons.

CTA NECK:

The bilateral common, cervical internal carotid and cervical
vertebral arteries are patent within the neck. Atherosclerotic
disease as described. Approximately 40% narrowing of the proximal
cervical right ICA.

1.5 cm right thyroid lobe nodule. Thyroid ultrasound is recommended
for further evaluation when clinically feasible.

Patchy ground-glass opacity within the imaged lung apices may
reflect edema. Infection is difficult to exclude

## 2019-11-27 IMAGING — CT CT ANGIOGRAPHY NECK
3 of 7 series · 10 of 36 positions shown · IV contrast (OMNI 350)
Comparison: Concurrent noncontrast head CT, head CT 09/14/2018.

CLINICAL DATA: Code stroke, slurred speech, right facial droop,
left-sided weakness.

EXAM:
CT ANGIOGRAPHY HEAD AND NECK
TECHNIQUE: Multidetector CT imaging of the head and neck was performed using
the standard protocol during bolus administration of intravenous
contrast. Multiplanar CT image reconstructions and MIPs were
obtained to evaluate the vascular anatomy. Carotid stenosis
measurements (when applicable) are obtained utilizing NASCET
criteria, using the distal internal carotid diameter as the
denominator.
CONTRAST:  75mL OMNIPAQUE IOHEXOL 350 MG/ML SOLN

[Series 6: cta neck · axial · 0.45mm/px · z∈[-167,-61]mm · 2 of 159 slices shown]
[im 53/159  soft-tissue]
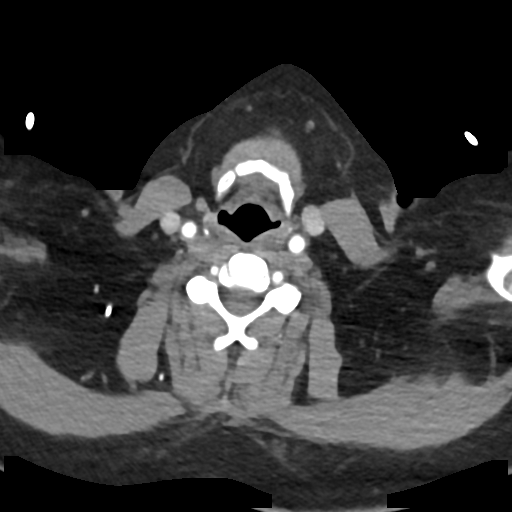
[im 106/159  soft-tissue]
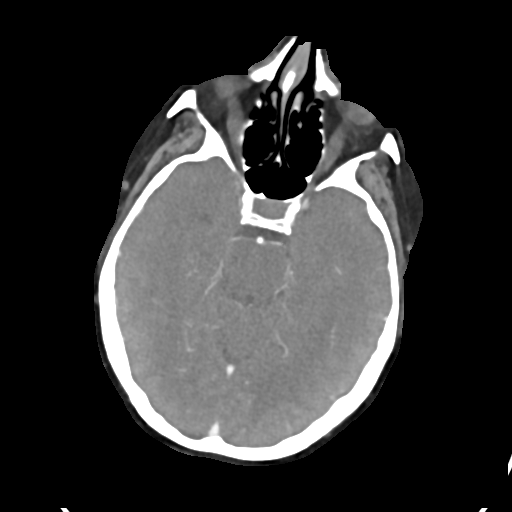

[Series 8: cta neck axial · axial · 0.39mm/px · z∈[-247,-25]mm · 6 of 317 slices shown]
[im 46/317  soft-tissue]
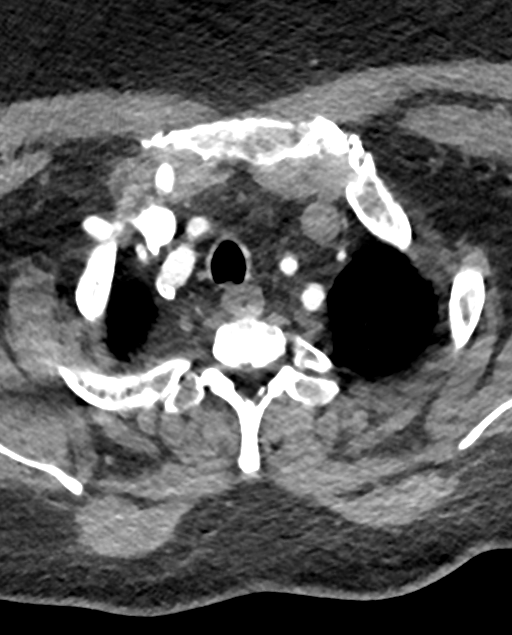
[im 91/317  bone]
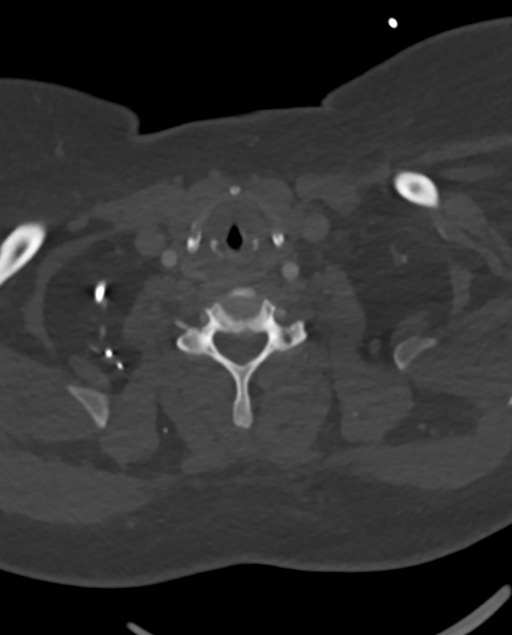
[im 136/317  soft-tissue]
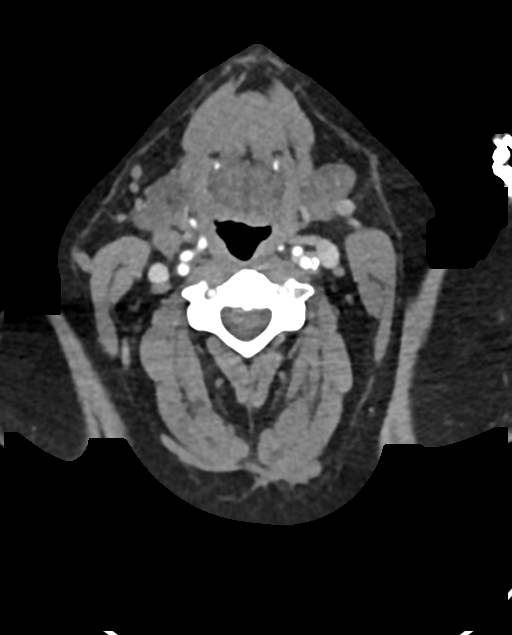
[im 181/317  bone]
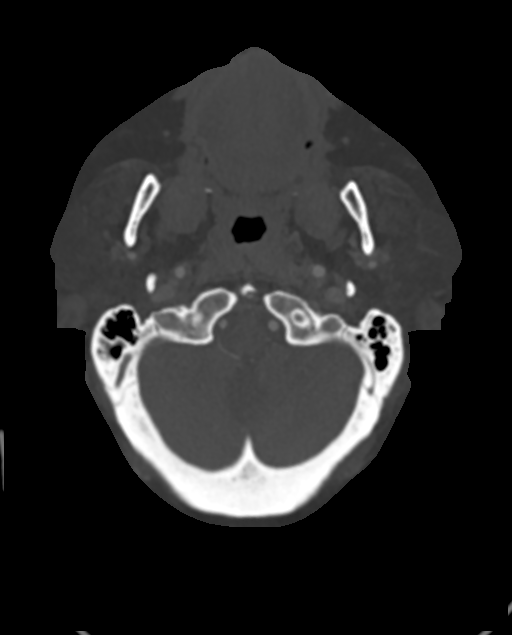
[im 226/317  soft-tissue]
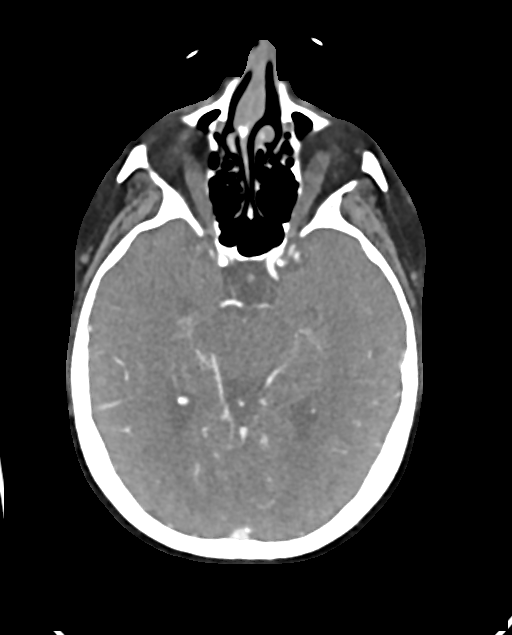
[im 271/317  bone]
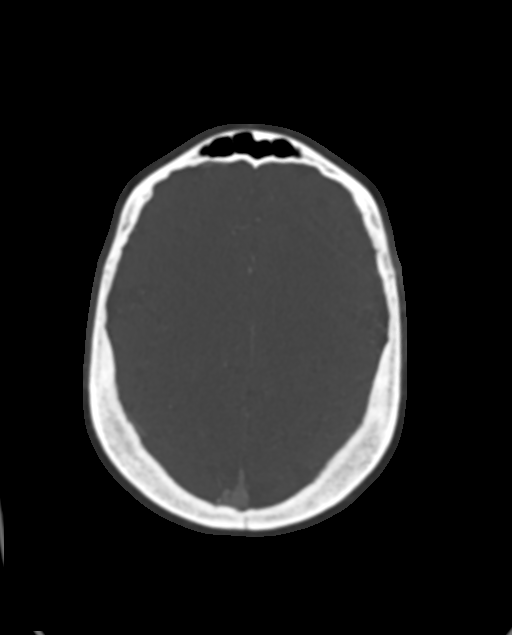

[Series 10: cta neck sagittal · sagittal · 0.41mm/px · 2 of 201 slices shown]
[im 33/201  soft-tissue]
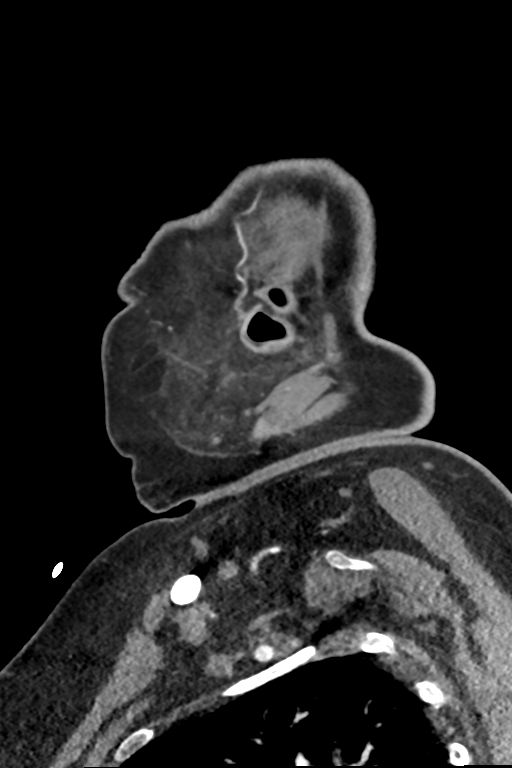
[im 169/201  soft-tissue]
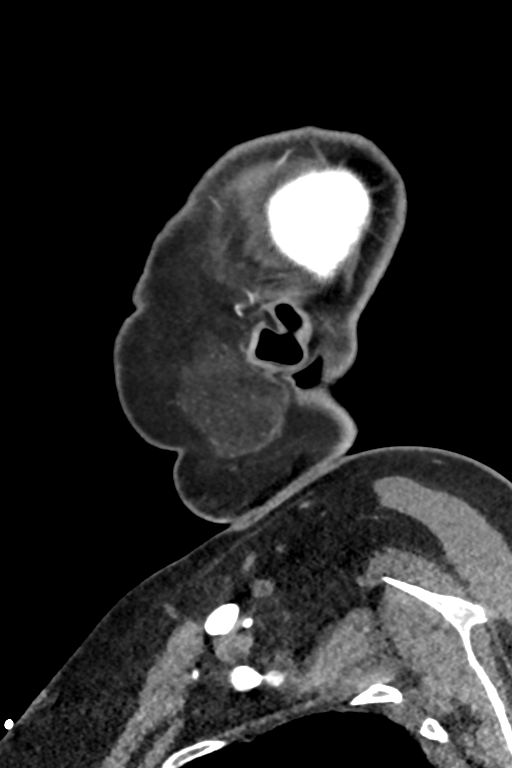

[10 of 36 positions shown; findings below may reference images not displayed]

FINDINGS: CTA NECK FINDINGS

Aortic arch: Standard branching. Scattered atherosclerotic
calcification within the visualized aortic arch and major branch
vessels. No high-grade narrowing of the major branch vessel origins.

Right carotid system: The right common and cervical internal carotid
arteries are patent. Atherosclerotic plaque within the right carotid
bifurcation and proximal cervical right internal carotid artery.
Narrowing of the proximal cervical right ICA with minimal diameter
of 2.4 mm. When compared with the normal diameter of the more distal
cervical ICA of 3.8, this corresponds with an estimated stenosis of
approximately 40%%.

Left carotid system: Atherosclerotic plaque at the left carotid
bifurcation and within the proximal left cervical internal carotid
artery without significant stenosis (50% or greater).

Vertebral arteries: The bilateral cervical vertebral arteries are
patent within the neck without significant stenosis (50% or
greater).

Skeleton: No suspicious lytic or blastic osseous lesions.

Other neck: 1.5 cm right thyroid lobe nodule with small associated
focus of calcification. No soft tissue neck mass or pathologically
enlarged cervical chain lymph nodes.

Upper chest: Patchy ground-glass opacity within the imaged lung
apices.

Review of the MIP images confirms the above findings

CTA HEAD FINDINGS

Anterior circulation: Calcified and noncalcified atheromatous plaque
within the bilateral intracranial internal carotid siphons with
regions of mild luminal narrowing. The right middle and anterior
cerebral arteries are patent without significant proximal stenosis.
The left anterior middle cerebral arteries are patent without
significant proximal stenosis. No anterior circulation aneurysm is
identified.

Posterior circulation: The distal vertebral arteries are patent
without significant stenosis. The basilar artery is patent without
significant stenosis. The bilateral posterior cerebral arteries are
patent without significant proximal stenosis. No posterior
circulation aneurysm is identified.

Venous sinuses: Within the limitations of contrast timing, no
evidence of thrombosis.

Anatomic variants: Posterior communicating arteries are hypoplastic
or absent bilaterally.

Review of the MIP images confirms the above findings

These results were communicated to Dr. Telada At [DATE] pmon
02/27/2019by text page via the AMION messaging system.
IMPRESSION: CTA HEAD:

No emergent large vessel occlusion.

Atherosclerotic disease with regions of mild luminal narrowing in
the bilateral carotid siphons.

CTA NECK:

The bilateral common, cervical internal carotid and cervical
vertebral arteries are patent within the neck. Atherosclerotic
disease as described. Approximately 40% narrowing of the proximal
cervical right ICA.

1.5 cm right thyroid lobe nodule. Thyroid ultrasound is recommended
for further evaluation when clinically feasible.

Patchy ground-glass opacity within the imaged lung apices may
reflect edema. Infection is difficult to exclude

## 2019-11-27 NOTE — ED Notes (Signed)
Pt states she is wanting to go home. Pt reports she has an appt with her kidney specialist tomorrow morning. Advised pt to return if she felt bad. Pt verbalized understanding.

## 2019-11-27 NOTE — Telephone Encounter (Signed)
Medical Clearance sent at this time to Dr Loanne Drilling.   P: [phone] F: [phone]

## 2019-11-27 NOTE — ED Triage Notes (Addendum)
Pt to triage via w/c with no distress noted, mask in place; EMS brought pt in from home; Pt reports mid CP, nonradiating, accomp by [facility] x 2wks with swelling to abd; pt is on O2 at 2l/min via [place] as needed at home

## 2019-11-28 ENCOUNTER — Telehealth: Payer: Self-pay | Admitting: Endocrinology

## 2019-11-28 ENCOUNTER — Telehealth: Payer: Self-pay | Admitting: *Deleted

## 2019-11-28 DIAGNOSIS — N179 Acute kidney failure, unspecified: Secondary | ICD-10-CM | POA: Diagnosis not present

## 2019-11-28 DIAGNOSIS — E1122 Type 2 diabetes mellitus with diabetic chronic kidney disease: Secondary | ICD-10-CM | POA: Diagnosis not present

## 2019-11-28 DIAGNOSIS — R600 Localized edema: Secondary | ICD-10-CM | POA: Diagnosis not present

## 2019-11-28 NOTE — Telephone Encounter (Signed)
Patient was in office last week and she is following up on the surgical clearance paperwork.  Call back number [phone]

## 2019-11-28 NOTE — Telephone Encounter (Signed)
Pt cannot be cleared as her A1C is 11.4 as indicated below. In addition, pt is scheduled for follow up with Dr. Loanne Drilling for follow up about this HIGH A1C:  Hgb A1c MFr Bld 4.8 - 5.6 % 11.8High    Comment: (NOTE)  Pre diabetes:          5.7%-6.4%  Diabetes:              >6.4%  Glycemic control for   <7.0%  adults with diabetes    Called pt and informed her that she has not been cleared because her A1C is NOT in acceptable NOR safe range for scheduling her surgery. Reminded about the results and that she has a follow up with Dr. Loanne Drilling 12/06/19 @315pm  to re-evaluate her A1C to determine if she can be cleared. Verbalized acceptance and understanding.

## 2019-11-28 NOTE — Telephone Encounter (Signed)
Called pt back. Made patient aware that we received Cardiac clearance but still waiting for Medical Clearance from PCP. Pt said she seen pcp and that he was going to clear her for surgery. Advised pt that from her chart she has a follow up appt with PCP on 12/06/19. Pt was not aware of this. Advised pt to give their office a call.   Advised pt that we still need clearance from PCP in order to proceed with scheduling surgery. Pt understands and will call office if she has any further concerns.

## 2019-11-28 NOTE — Telephone Encounter (Signed)
Patient called and wanted to know if she is able to get surgery scheduled now. Please call and advise

## 2019-11-29 ENCOUNTER — Other Ambulatory Visit: Payer: Self-pay | Admitting: Primary Care

## 2019-11-29 DIAGNOSIS — E1165 Type 2 diabetes mellitus with hyperglycemia: Secondary | ICD-10-CM

## 2019-12-03 DIAGNOSIS — L03116 Cellulitis of left lower limb: Secondary | ICD-10-CM | POA: Diagnosis not present

## 2019-12-03 DIAGNOSIS — E1159 Type 2 diabetes mellitus with other circulatory complications: Secondary | ICD-10-CM | POA: Diagnosis not present

## 2019-12-03 DIAGNOSIS — E1142 Type 2 diabetes mellitus with diabetic polyneuropathy: Secondary | ICD-10-CM | POA: Diagnosis not present

## 2019-12-03 DIAGNOSIS — E1169 Type 2 diabetes mellitus with other specified complication: Secondary | ICD-10-CM | POA: Diagnosis not present

## 2019-12-05 ENCOUNTER — Inpatient Hospital Stay: Payer: BC Managed Care – PPO | Admitting: Internal Medicine

## 2019-12-06 ENCOUNTER — Other Ambulatory Visit: Payer: Self-pay

## 2019-12-06 ENCOUNTER — Telehealth (INDEPENDENT_AMBULATORY_CARE_PROVIDER_SITE_OTHER): Payer: Medicare HMO | Admitting: Endocrinology

## 2019-12-06 DIAGNOSIS — E1165 Type 2 diabetes mellitus with hyperglycemia: Secondary | ICD-10-CM

## 2019-12-06 MED ORDER — HUMULIN R U-500 (CONCENTRATED) 500 UNIT/ML ~~LOC~~ SOLN: SUBCUTANEOUS | 11 refills | Status: DC

## 2019-12-06 MED ORDER — ONETOUCH VERIO VI STRP: 1.0000 | ORAL_STRIP | Freq: Every day | 0 refills | Status: DC

## 2019-12-06 MED ORDER — "INSULIN SYRINGE 30G X 1/2"" 1 ML MISC": 1.0000 | Freq: Two times a day (BID) | 0 refills | Status: DC

## 2019-12-06 NOTE — Progress Notes (Signed)
Subjective:    Patient ID: [name], female    DOB: 07/20/1970, 49 y.o.   MRN: [id number]  HPI telehealth visit today via phone x 7 minutes Alternatives to telehealth are presented to this patient, and the patient agrees to the telehealth visit. Pt is advised of the cost of the visit, and agrees to this, also.   Patient is at home, and I am at the office.   Persons attending the telehealth visit: the patient and I Pt returns for f/u of diabetes mellitus: DM type: Insulin-requiring type 2 Dx'ed: 1988.  Complications: polyneuropathy, TIA, renal insuff, and DR.   Therapy: insulin since soon after dx.   GDM: never DKA: never Severe hypoglycemia: last episode was mid-2018.   Pancreatitis: never Pancreatic imaging: normal on 2011 CT.   SDOH: pt says she cannot afford copay for GLP med Other: she takes multiple daily injections; she takes U-500, due to severe insulin resistance.  She does not eat lunch, so she does not take insulin then. Interval history: pt states cbg's vary from 100-290.  It is in general higher as the day goes on.  Pt says she never misses the insulin.  Breast surgery is delayed, pending improved glycemic control.  Past Medical History:  Diagnosis Date  . Anxiety    takes Prozac daily  . Anxiety   . Aortic valve calcification   . Asthma    Advair and Spirva daily  . Asthma   . Bipolar disorder ([place])   . CAD (coronary artery disease)    a. LHC 11/2013 done for CP/fluid retention: mild disease in prox LAD, mild-mod disease in mRCA, EF 60% with normal LVEDP. b. Normal nuc 03/2016.  Marland Kitchen CHF (congestive heart failure) ([place])   . Chronic diastolic CHF (congestive heart failure) ([place])   . Chronic heart failure with preserved ejection fraction ([place]) 11/16/2018  . CKD (chronic kidney disease), stage II   . COPD (chronic obstructive pulmonary disease) ([facility])    a. nocturnal O2.  Marland Kitchen COPD (chronic obstructive pulmonary disease) ([place])   . Coronary artery disease   . Decreased  urine stream   . Diabetes mellitus   . Diabetes mellitus without complication ([place])   . Dyspnea   . Family history of adverse reaction to anesthesia    mom gets nauseated  . GERD (gastroesophageal reflux disease)    takes Pepcid daily  . History of blood clots    left leg 3-91yrs ago  . Hyperlipidemia   . Hypertension   . Hypertriglyceridemia   . Inguinal hernia, left 01/2015  . Muscle spasm   . Open wound of genital labia   . Peripheral neuropathy   . RBBB   . Seizures ([place])   . Sepsis ([place]) 01/19/2018  . Sinus tachycardia    a. persistent since 2009.  Marland Kitchen Smokers' cough ([place])   . Stroke [facility]) 1989   left sided weakness  . TIA (transient ischemic attack)   . Tobacco abuse   . Vulvar abscess 01/23/2018    Past Surgical History:  Procedure Laterality Date  . BREAST BIOPSY Right 11/06/2019   [place] core path pending venus clip  . HERNIA REPAIR    . INCISION AND DRAINAGE ABSCESS N/A 01/26/2018   Procedure: INCISION AND DEBRIDEMENT OF VULVAR NECROTIZING SOFT TISSUE INFECTION;  Surgeon: Greer Pickerel, MD;  Location: [facility];  Service: General;  Laterality: N/A;  . INCISION AND DRAINAGE PERIRECTAL ABSCESS N/A 01/22/2018   Procedure: IRRIGATION AND DEBRIDEMENT LABIAL/VULVAR AREA;  Surgeon:  Coralie Keens, MD;  Location: [facility];  Service: General;  Laterality: N/A;  . INCISION AND DRAINAGE PERIRECTAL ABSCESS N/A 01/29/2018   Procedure: IRRIGATION AND DEBRIDEMENT VULVA;  Surgeon: Excell Seltzer, MD;  Location: [facility];  Service: General;  Laterality: N/A;  . INGUINAL HERNIA REPAIR Left 04/08/2015   Procedure: OPEN LEFT INGUINAL HERNIA REPAIR WITH MESH;  Surgeon: Ralene Ok, MD;  Location: [facility];  Service: General;  Laterality: Left;  . INSERTION OF MESH Left 04/08/2015   Procedure: INSERTION OF MESH;  Surgeon: Ralene Ok, MD;  Location: [name];  Service: General;  Laterality: Left;  . LAPAROSCOPY     Endometriosis  . LEFT HEART CATHETERIZATION WITH CORONARY ANGIOGRAM N/A 12/05/2013    Procedure: LEFT HEART CATHETERIZATION WITH CORONARY ANGIOGRAM;  Surgeon: Jettie Booze, MD;  Location: [facility] CATH LAB;  Service: Cardiovascular;  Laterality: N/A;  . right kidney drained    . TEE WITHOUT CARDIOVERSION N/A 11/28/2013   Procedure: TRANSESOPHAGEAL ECHOCARDIOGRAM (TEE);  Surgeon: Thayer Headings, MD;  Location: [facility] ENDOSCOPY;  Service: Cardiovascular;  Laterality: N/A;    Social History   Socioeconomic History  . Marital status: Married    Spouse name: Not on file  . Number of children: 2  . Years of education: Not on file  . Highest education level: Not on file  Occupational History  . Not on file  Tobacco Use  . Smoking status: Current Every Day Smoker    Packs/day: 1.00    Years: 36.00    Pack years: 36.00    Types: Cigarettes    Start date: 07/11/1981  . Smokeless tobacco: Never Used  . Tobacco comment: 1 ppd now  Substance and Sexual Activity  . Alcohol use: No  . Drug use: No  . Sexual activity: Not Currently  Other Topics Concern  . Not on file  Social History Narrative   ** Merged History Encounter **       Lives in [place]   Married but lives with Boyfriend - not legally separated   Disabled - for BiPolar, Seizure disorder, diabetes   Formerly worked at [employer] 1 ppd; no alcohol. 2 step sons; no biologic children.       Social Determinants of Health   Financial Resource Strain:   . Difficulty of Paying Living Expenses:   Food Insecurity:   . Worried About Charity fundraiser in the Last Year:   . Arboriculturist in the Last Year:   Transportation Needs:   . Film/video editor (Medical):   Marland Kitchen Lack of Transportation (Non-Medical):   Physical Activity:   . Days of Exercise per Week:   . Minutes of Exercise per Session:   Stress:   . Feeling of Stress :   Social Connections:   . Frequency of Communication with Friends and Family:   . Frequency of Social Gatherings with Friends and Family:   . Attends Religious Services:    . Active Member of Clubs or Organizations:   . Attends Archivist Meetings:   Marland Kitchen Marital Status:   Intimate Partner Violence:   . Fear of Current or Ex-Partner:   . Emotionally Abused:   Marland Kitchen Physically Abused:   . Sexually Abused:     Current Outpatient Medications on File Prior to Visit  Medication Sig Dispense Refill  . acetaminophen (TYLENOL) 500 MG tablet Take 1,000 mg by mouth every 4 (four) hours as needed for moderate pain.     Marland Kitchen  albuterol (VENTOLIN HFA) 108 (90 Base) MCG/ACT inhaler Inhale 2 puffs into the lungs every 6 (six) hours as needed for wheezing or shortness of breath.    . ALPRAZolam (XANAX) 0.5 MG tablet Take 1 tablet (0.5 mg total) by mouth 2 (two) times daily as needed for anxiety. (Patient taking differently: Take 0.5 mg by mouth 3 (three) times daily. )  0  . aspirin EC 81 MG tablet Take 81 mg by mouth every morning.    . budesonide-formoterol (SYMBICORT) 80-4.5 MCG/ACT inhaler INHALE 2 PUFFS BY MOUTH EVERY 12 HOURS TO PREVENT COUGH OR WHEEZING *RINSE MOUTH AFTER EACH USE* (Patient taking differently: Inhale 2 puffs into the lungs in the morning and at bedtime. ) 10.2 g 3  . busPIRone (BUSPAR) 30 MG tablet Take 30 mg by mouth 2 (two) times daily.    . famotidine (PEPCID) 20 MG tablet TAKE 1 TABLET (20 MG TOTAL) BY MOUTH 2 (TWO) TIMES DAILY. FOR HEARTBURN. (Patient taking differently: Take 20 mg by mouth 2 (two) times daily. ) 180 tablet 1  . FLUoxetine (PROZAC) 40 MG capsule Take 40 mg by mouth daily.     . Insulin Pen Needle 32G X 4 MM MISC Used to give insulin injections twice daily. 100 each 11  . ipratropium-albuterol (DUONEB) 0.5-2.5 (3) MG/3ML SOLN Take 3 mLs by nebulization every 4 (four) hours as needed (wheezing/shortness of breath). 360 mL 0  . metolazone (ZAROXOLYN) 5 MG tablet Take 5 mg by mouth daily.     Glory Rosebush Delica Lancets 99991111 MISC 1 each by Does not apply route 3 (three) times daily. Use to monitor glucose levels three times daily; E11.9;  E10.42 100 each 11  . OXYGEN Inhale 2-4 L into the lungs at bedtime.     . potassium chloride 20 MEQ TBCR Take 20 mEq by mouth daily. 60 tablet 2  . pregabalin (LYRICA) 300 MG capsule Take 1 capsule (300 mg total) by mouth 2 (two) times daily. For neuropathy. 180 capsule 0  . rOPINIRole (REQUIP) 1 MG tablet TAKE 1 TABLET BY MOUTH EVERY NIGHT AT BEDTIME FOR RESTLESS LEGS. (Patient taking differently: Take 1 mg by mouth at bedtime. ) 90 tablet 1  . rosuvastatin (CRESTOR) 10 MG tablet Take 1 tablet (10 mg total) by mouth daily. 90 tablet 3  . spironolactone (ALDACTONE) 50 MG tablet TAKE 1 TABLET BY MOUTH ONCE A DAY (Patient taking differently: Take 50 mg by mouth daily. ) 90 tablet 1  . torsemide (DEMADEX) 20 MG tablet TAKE 1 TABLET BY MOUTH 4 TIMES DAILY (Patient taking differently: Take 20 mg by mouth 2 (two) times daily. ) 360 tablet 3  . traZODone (DESYREL) 100 MG tablet Take 200 mg by mouth at bedtime.     Marland Kitchen ULTICARE MINI PEN NEEDLES 31G X 6 MM MISC USE AS DIRECTED THREE TIMES DAILY 100 each 0   No current facility-administered medications on file prior to visit.    Allergies  Allergen Reactions  . Adhesive [Tape] Rash and Other (See Comments)    TAKES OFF THE SKIN (CERTAIN MEDICAL TAPES DO THIS!!)  . Metoprolol Shortness Of Breath    Occurrence of shortness of breath after 3 days  . Montelukast Shortness Of Breath  . Morphine Sulfate Anaphylaxis, Shortness Of Breath and Nausea And Vomiting    Swollen Throat - Able to tolerate dilaudid  . Penicillins Anaphylaxis, Hives and Shortness Of Breath    Throat swells Has patient had a PCN reaction causing immediate rash, facial/tongue/throat  swelling, SOB or lightheadedness with hypotension: Yes Has patient had a PCN reaction causing severe rash involving mucus membranes or skin necrosis: No Has patient had a PCN reaction that required hospitalization: Yes Has patient had a PCN reaction occurring within the last 10 years: No If all of the  above answers are "NO", then may proceed with Cephalosporin use.   . Prednisone Anaphylaxis  . Diltiazem Swelling  . Gabapentin Swelling    Family History  Problem Relation Age of Onset  . Venous thrombosis Brother   . Other Brother        BRAIN TUMOR  . Asthma Father   . Diabetes Father   . Coronary artery disease Mother   . Hypertension Mother   . Diabetes Mother   . Breast cancer Mother 78  . Asthma Sister   . Diabetes type II Brother     There were no vitals taken for this visit.    Review of Systems She denies hypoglycemia.      Objective:   Physical Exam      Assessment & Plan:  Insulin-requiring type 2 DM:she needs increased rx  Patient Instructions  Please increase the insulin to 480 units with breakfast, and 450 units with supper.  Confirmed by readback).  check your blood sugar once a day.  vary the time of day when you check, between before the 3 meals, and at bedtime.  also check if you have symptoms of your blood sugar being too high or too low.  please keep a record of the readings and bring it to your next appointment here (or you can bring the meter itself).  You can write it on any piece of paper.  please call us sooner if your blood sugar goes below 70, or if you have a lot of readings over 200. Please come back for a follow-up appointment in next week, in person.

## 2019-12-06 NOTE — Progress Notes (Signed)
Success Icon @ 2:03pm Invitation Sent Your invitation to [id number] has been sent. This invitation will expire in 30 minutes.  OK  Success Icon @ 3:26pm Invitation Sent Your invitation to [id number] has been sent. This invitation will expire in 30 minutes.  OK

## 2019-12-06 NOTE — Patient Instructions (Addendum)
Please increase the insulin to 480 units with breakfast, and 450 units with supper.  Confirmed by readback).  check your blood sugar once a day.  vary the time of day when you check, between before the 3 meals, and at bedtime.  also check if you have symptoms of your blood sugar being too high or too low.  please keep a record of the readings and bring it to your next appointment here (or you can bring the meter itself).  You can write it on any piece of paper.  please call us sooner if your blood sugar goes below 70, or if you have a lot of readings over 200. Please come back for a follow-up appointment in next week, in person.

## 2019-12-11 ENCOUNTER — Telehealth: Payer: Self-pay | Admitting: Emergency Medicine

## 2019-12-11 NOTE — Telephone Encounter (Signed)
Medical Clearance sent to pt new doctor, Dr Mee Hives.  P: [phone] F: [phone]

## 2019-12-16 ENCOUNTER — Emergency Department: Payer: BC Managed Care – PPO

## 2019-12-16 ENCOUNTER — Emergency Department
Admission: EM | Admit: 2019-12-16 | Discharge: 2019-12-16 | Disposition: A | Discharge status: Home / Self Care | Payer: BC Managed Care – PPO | Attending: Emergency Medicine | Admitting: Emergency Medicine

## 2019-12-16 ENCOUNTER — Telehealth: Payer: Self-pay | Admitting: Internal Medicine

## 2019-12-16 ENCOUNTER — Other Ambulatory Visit: Payer: Self-pay

## 2019-12-16 DIAGNOSIS — I4510 Unspecified right bundle-branch block: Secondary | ICD-10-CM | POA: Diagnosis not present

## 2019-12-16 DIAGNOSIS — E1122 Type 2 diabetes mellitus with diabetic chronic kidney disease: Secondary | ICD-10-CM | POA: Diagnosis not present

## 2019-12-16 DIAGNOSIS — N182 Chronic kidney disease, stage 2 (mild): Secondary | ICD-10-CM | POA: Diagnosis not present

## 2019-12-16 DIAGNOSIS — I130 Hypertensive heart and chronic kidney disease with heart failure and stage 1 through stage 4 chronic kidney disease, or unspecified chronic kidney disease: Secondary | ICD-10-CM | POA: Diagnosis not present

## 2019-12-16 DIAGNOSIS — E860 Dehydration: Secondary | ICD-10-CM

## 2019-12-16 DIAGNOSIS — Z7984 Long term (current) use of oral hypoglycemic drugs: Secondary | ICD-10-CM | POA: Diagnosis not present

## 2019-12-16 DIAGNOSIS — I2510 Atherosclerotic heart disease of native coronary artery without angina pectoris: Secondary | ICD-10-CM | POA: Insufficient documentation

## 2019-12-16 DIAGNOSIS — R079 Chest pain, unspecified: Secondary | ICD-10-CM | POA: Diagnosis not present

## 2019-12-16 DIAGNOSIS — R531 Weakness: Secondary | ICD-10-CM

## 2019-12-16 DIAGNOSIS — R0789 Other chest pain: Secondary | ICD-10-CM | POA: Diagnosis not present

## 2019-12-16 DIAGNOSIS — R4781 Slurred speech: Secondary | ICD-10-CM | POA: Diagnosis not present

## 2019-12-16 DIAGNOSIS — I5032 Chronic diastolic (congestive) heart failure: Secondary | ICD-10-CM | POA: Insufficient documentation

## 2019-12-16 DIAGNOSIS — I10 Essential (primary) hypertension: Secondary | ICD-10-CM | POA: Diagnosis not present

## 2019-12-16 DIAGNOSIS — G8190 Hemiplegia, unspecified affecting unspecified side: Secondary | ICD-10-CM | POA: Diagnosis not present

## 2019-12-16 LAB — COMPREHENSIVE METABOLIC PANEL
ALT: 26 U/L (ref 0–44)
AST: 27 U/L (ref 15–41)
Albumin: 3.6 g/dL (ref 3.5–5.0)
Alkaline Phosphatase: 114 U/L (ref 38–126)
Anion gap: 14 (ref 5–15)
BUN: 60 mg/dL — ABNORMAL HIGH (ref 6–20)
CO2: 31 mmol/L (ref 22–32)
Calcium: 9.2 mg/dL (ref 8.9–10.3)
Chloride: 89 mmol/L — ABNORMAL LOW (ref 98–111)
Creatinine, Ser: 1.93 mg/dL — ABNORMAL HIGH (ref 0.44–1.00)
GFR calc Af Amer: 34 mL/min — ABNORMAL LOW (ref >60)
GFR calc non Af Amer: 30 mL/min — ABNORMAL LOW (ref >60)
Glucose, Bld: 183 mg/dL — ABNORMAL HIGH (ref 70–99)
Potassium: 3.8 mmol/L (ref 3.5–5.1)
Sodium: 134 mmol/L — ABNORMAL LOW (ref 135–145)
Total Bilirubin: 1.2 mg/dL (ref 0.3–1.2)
Total Protein: 8.8 g/dL — ABNORMAL HIGH (ref 6.5–8.1)

## 2019-12-16 LAB — CBC WITH DIFFERENTIAL/PLATELET
Abs Immature Granulocytes: 0.12 10*3/uL — ABNORMAL HIGH (ref 0.00–0.07)
Basophils Absolute: 0.1 10*3/uL (ref 0.0–0.1)
Basophils Relative: 1 %
Eosinophils Absolute: 0.3 10*3/uL (ref 0.0–0.5)
Eosinophils Relative: 2 %
HCT: 42 % (ref 36.0–46.0)
Hemoglobin: 13.7 g/dL (ref 12.0–15.0)
Immature Granulocytes: 1 %
Lymphocytes Relative: 12 %
Lymphs Abs: 1.7 10*3/uL (ref 0.7–4.0)
MCH: 27.6 pg (ref 26.0–34.0)
MCHC: 32.6 g/dL (ref 30.0–36.0)
MCV: 84.5 fL (ref 80.0–100.0)
Monocytes Absolute: 0.8 10*3/uL (ref 0.1–1.0)
Monocytes Relative: 5 %
Neutro Abs: 11.1 10*3/uL — ABNORMAL HIGH (ref 1.7–7.7)
Neutrophils Relative %: 79 %
Platelets: 197 10*3/uL (ref 150–400)
RBC: 4.97 MIL/uL (ref 3.87–5.11)
RDW: 16.7 % — ABNORMAL HIGH (ref 11.5–15.5)
WBC: 14.2 10*3/uL — ABNORMAL HIGH (ref 4.0–10.5)
nRBC: 0 % (ref 0.0–0.2)

## 2019-12-16 LAB — URINALYSIS, COMPLETE (UACMP) WITH MICROSCOPIC
Bilirubin Urine: NEGATIVE
Glucose, UA: >=500 mg/dL — AB
Ketones, ur: NEGATIVE mg/dL
Leukocytes,Ua: NEGATIVE
Nitrite: NEGATIVE
Protein, ur: NEGATIVE mg/dL
Specific Gravity, Urine: 1.006 (ref 1.005–1.030)
WBC, UA: NONE SEEN WBC/hpf (ref 0–5)
pH: 5 (ref 5.0–8.0)

## 2019-12-16 LAB — BLOOD GAS, VENOUS
Bicarbonate: 38.6 mmol/L — ABNORMAL HIGH (ref 20.0–28.0)
pCO2, Ven: 53 mmHg (ref 44.0–60.0)
pH, Ven: 7.47 — ABNORMAL HIGH (ref 7.250–7.430)
pO2, Ven: 50 mmHg — ABNORMAL HIGH (ref 32.0–45.0)

## 2019-12-16 LAB — TROPONIN I (HIGH SENSITIVITY): Troponin I (High Sensitivity): 11 ng/L (ref <18)

## 2019-12-16 MED ORDER — SODIUM CHLORIDE 0.9 % IV BOLUS
500.0000 mL | Freq: Once | INTRAVENOUS | Status: AC
  Administered 2019-12-16: 500 mL via INTRAVENOUS

## 2019-12-16 NOTE — Telephone Encounter (Signed)
Patient phoned on this date and stated that she was just released from the hospital and wanted to see MD sooner. Appt moved to 12-17-19.

## 2019-12-16 NOTE — ED Notes (Signed)
This RN asked pt who was coming to pick her up at d/c. She said her husband would be coming to get her. Attempted to call husband to inform him that pt is ready for d/c. Unable to l/m. Advised pt to try calling husband on her call phone

## 2019-12-16 NOTE — ED Notes (Signed)
Pt to CT now

## 2019-12-16 NOTE — ED Provider Notes (Signed)
ER Provider Note       Time seen: 10:35 AM    I have reviewed the vital signs and the nursing notes.  HISTORY   Chief Complaint Weakness    HPI [name] is a 49 y.o. female with a history of anxiety, asthma, bipolar disorder, coronary disease, CHF, COPD, diabetes, GERD, hypertension, hyperlipidemia, CVA who presents today for multiple complaints.  Patient complains of left-sided weakness and numbness that started this morning.  Patient arrives drowsy, cannot give specifics as to when this started.  Past Medical History:  Diagnosis Date  . Anxiety    takes Prozac daily  . Anxiety   . Aortic valve calcification   . Asthma    Advair and Spirva daily  . Asthma   . Bipolar disorder ([place])   . CAD (coronary artery disease)    a. LHC 11/2013 done for CP/fluid retention: mild disease in prox LAD, mild-mod disease in mRCA, EF 60% with normal LVEDP. b. Normal nuc 03/2016.  Marland Kitchen CHF (congestive heart failure) ([place])   . Chronic diastolic CHF (congestive heart failure) ([place])   . Chronic heart failure with preserved ejection fraction ([place]) 11/16/2018  . CKD (chronic kidney disease), stage II   . COPD (chronic obstructive pulmonary disease) ([facility])    a. nocturnal O2.  Marland Kitchen COPD (chronic obstructive pulmonary disease) ([place])   . Coronary artery disease   . Decreased urine stream   . Diabetes mellitus   . Diabetes mellitus without complication ([place])   . Dyspnea   . Family history of adverse reaction to anesthesia    mom gets nauseated  . GERD (gastroesophageal reflux disease)    takes Pepcid daily  . History of blood clots    left leg 3-22yrs ago  . Hyperlipidemia   . Hypertension   . Hypertriglyceridemia   . Inguinal hernia, left 01/2015  . Muscle spasm   . Open wound of genital labia   . Peripheral neuropathy   . RBBB   . Seizures ([place])   . Sepsis ([place]) 01/19/2018  . Sinus tachycardia    a. persistent since 2009.  Marland Kitchen Smokers' cough ([place])   . Stroke [facility]) 1989   left sided  weakness  . TIA (transient ischemic attack)   . Tobacco abuse   . Vulvar abscess 01/23/2018    Past Surgical History:  Procedure Laterality Date  . BREAST BIOPSY Right 11/06/2019   [place] core path pending venus clip  . HERNIA REPAIR    . INCISION AND DRAINAGE ABSCESS N/A 01/26/2018   Procedure: INCISION AND DEBRIDEMENT OF VULVAR NECROTIZING SOFT TISSUE INFECTION;  Surgeon: Greer Pickerel, MD;  Location: [place];  Service: General;  Laterality: N/A;  . INCISION AND DRAINAGE PERIRECTAL ABSCESS N/A 01/22/2018   Procedure: IRRIGATION AND DEBRIDEMENT LABIAL/VULVAR AREA;  Surgeon: Coralie Keens, MD;  Location: [place];  Service: General;  Laterality: N/A;  . INCISION AND DRAINAGE PERIRECTAL ABSCESS N/A 01/29/2018   Procedure: IRRIGATION AND DEBRIDEMENT VULVA;  Surgeon: Excell Seltzer, MD;  Location: [place];  Service: General;  Laterality: N/A;  . INGUINAL HERNIA REPAIR Left 04/08/2015   Procedure: OPEN LEFT INGUINAL HERNIA REPAIR WITH MESH;  Surgeon: Ralene Ok, MD;  Location: [place];  Service: General;  Laterality: Left;  . INSERTION OF MESH Left 04/08/2015   Procedure: INSERTION OF MESH;  Surgeon: Ralene Ok, MD;  Location: [place];  Service: General;  Laterality: Left;  . LAPAROSCOPY     Endometriosis  . LEFT HEART CATHETERIZATION WITH CORONARY ANGIOGRAM  N/A 12/05/2013   Procedure: LEFT HEART CATHETERIZATION WITH CORONARY ANGIOGRAM;  Surgeon: Jettie Booze, MD;  Location[facility];  Service: Cardiovascular;  Laterality: N/A;  . right kidney drained    . TEE WITHOUT CARDIOVERSION N/A 11/28/2013   Procedure: TRANSESOPHAGEAL ECHOCARDIOGRAM (TEE);  Surgeon: Thayer Headings, MD;  Location: [facility] ENDOSCOPY;  Service: Cardiovascular;  Laterality: N/A;    Allergies Adhesive [tape], Metoprolol, Montelukast, Morphine sulfate, Penicillins, Prednisone, Diltiazem, and Gabapentin   Review of Systems Constitutional: Negative for fever. Cardiovascular: Negative for chest pain. Respiratory:  Negative for shortness of breath. Gastrointestinal: Negative for abdominal pain, vomiting and diarrhea. Musculoskeletal: Negative for back pain. Skin: Negative for rash. Neurological: Positive for left-sided numbness and weakness  All systems negative/normal/unremarkable except as stated in the HPI  ____________________________________________   PHYSICAL EXAM:  VITAL SIGNS: Vitals:   12/16/19 1029 12/16/19 1034  BP: 128/63   Pulse: 80   Resp: 18   Temp:  98.2 F (36.8 C)  SpO2: 91%     Constitutional: Drowsy but oriented, no distress Eyes: Conjunctivae are normal. Normal extraocular movements. ENT      Head: Normocephalic and atraumatic.      Nose: No congestion/rhinnorhea.      Mouth/Throat: Mucous membranes are moist.      Neck: No stridor. Cardiovascular: Normal rate, regular rhythm. No murmurs, rubs, or gallops. Respiratory: Normal respiratory effort without tachypnea nor retractions. Breath sounds are clear and equal bilaterally. No wheezes/rales/rhonchi. Gastrointestinal: Soft and nontender. Normal bowel sounds Musculoskeletal: Nontender with normal range of motion in extremities.  Lower extremity edema is noted Neurologic: Intermittent slurred speech, left-sided weakness in the arm and leg, cranial nerves appear to be intact.  Decreased sensation in left arm and left leg compared to right Skin:  Skin is warm, dry and intact. No rash noted. Psychiatric: Speech and behavior are normal.  ____________________________________________  EKG: Interpreted by me.  Sinus rhythm with rate of 79 bpm, left axis deviation, incomplete right bundle branch block  ____________________________________________   LABS (pertinent positives/negatives)  Labs Reviewed  CBC WITH DIFFERENTIAL/PLATELET - Abnormal; Notable for the following components:      Result Value   WBC 14.2 (*)    RDW 16.7 (*)    Neutro Abs 11.1 (*)    Abs Immature Granulocytes 0.12 (*)    All other components  within normal limits  COMPREHENSIVE METABOLIC PANEL - Abnormal; Notable for the following components:   Sodium 134 (*)    Chloride 89 (*)    Glucose, Bld 183 (*)    BUN 60 (*)    Creatinine, Ser 1.93 (*)    Total Protein 8.8 (*)    GFR calc non Af Amer 30 (*)    GFR calc Af Amer 34 (*)    All other components within normal limits  BLOOD GAS, VENOUS - Abnormal; Notable for the following components:   pH, Ven 7.47 (*)    pO2, Ven 50.0 (*)    Bicarbonate 38.6 (*)    All other components within normal limits  URINALYSIS, COMPLETE (UACMP) WITH MICROSCOPIC - Abnormal; Notable for the following components:   Color, Urine YELLOW (*)    APPearance CLEAR (*)    Glucose, UA >=500 (*)    Hgb urine dipstick SMALL (*)    Bacteria, UA RARE (*)    All other components within normal limits  TROPONIN I (HIGH SENSITIVITY)    RADIOLOGY  Images were viewed by me CT head IMPRESSION: No acute intracranial  abnormality is noted. CXR  DIFFERENTIAL DIAGNOSIS  CVA, TIA, medication side effect, hypercarbic respiratory failure, occult infection  ASSESSMENT AND PLAN  Weakness, dehydration   Plan: The patient had presented for left-sided weakness. Patient's labs did reveal some dehydration for which she was given IV fluids.  This symptomatology appears to have happened to her in the past and is nonspecific.  She has had multiple acute stroke work-ups within the last year or so.  Left-sided weakness has resolved here and she wants to go home.  She was again given IV fluids but otherwise is cleared for outpatient follow-up.  Lenise Arena MD    Note: This note was generated in part or whole with voice recognition software. Voice recognition is usually quite accurate but there are transcription errors that can and very often do occur. I apologize for any typographical errors that were not detected and corrected.     Earleen Newport, MD 12/16/19 1257

## 2019-12-16 NOTE — ED Triage Notes (Signed)
To ED via [facility] c/o left sided weakness that started this AM, unsure LNW time and palpitations. Pt appears drowsy. VSS with EMS, oxygen 90-91% on RA. EMS reports left sided weakness that is intermittent as pt is able to move left arm with ease to assist with IV start and BP but then unable to left up when doing stroke scale. EDP at bedside on arrival.

## 2019-12-17 ENCOUNTER — Inpatient Hospital Stay: Payer: BC Managed Care – PPO | Attending: Internal Medicine | Admitting: Internal Medicine

## 2019-12-17 ENCOUNTER — Encounter: Payer: Self-pay | Admitting: Internal Medicine

## 2019-12-17 VITALS — BP 138/66 | HR 81 | Temp 97.6°F | Resp 18 | Wt 283.0 lb

## 2019-12-17 DIAGNOSIS — J441 Chronic obstructive pulmonary disease with (acute) exacerbation: Secondary | ICD-10-CM | POA: Diagnosis not present

## 2019-12-17 DIAGNOSIS — R0600 Dyspnea, unspecified: Secondary | ICD-10-CM | POA: Diagnosis not present

## 2019-12-17 DIAGNOSIS — Z794 Long term (current) use of insulin: Secondary | ICD-10-CM | POA: Insufficient documentation

## 2019-12-17 DIAGNOSIS — E1165 Type 2 diabetes mellitus with hyperglycemia: Secondary | ICD-10-CM | POA: Diagnosis not present

## 2019-12-17 DIAGNOSIS — C50511 Malignant neoplasm of lower-outer quadrant of right female breast: Secondary | ICD-10-CM | POA: Diagnosis not present

## 2019-12-17 DIAGNOSIS — Z79899 Other long term (current) drug therapy: Secondary | ICD-10-CM | POA: Diagnosis not present

## 2019-12-17 DIAGNOSIS — E1142 Type 2 diabetes mellitus with diabetic polyneuropathy: Secondary | ICD-10-CM | POA: Diagnosis not present

## 2019-12-17 DIAGNOSIS — I130 Hypertensive heart and chronic kidney disease with heart failure and stage 1 through stage 4 chronic kidney disease, or unspecified chronic kidney disease: Secondary | ICD-10-CM | POA: Insufficient documentation

## 2019-12-17 DIAGNOSIS — F17210 Nicotine dependence, cigarettes, uncomplicated: Secondary | ICD-10-CM | POA: Insufficient documentation

## 2019-12-17 DIAGNOSIS — Z170 Estrogen receptor positive status [ER+]: Secondary | ICD-10-CM | POA: Diagnosis not present

## 2019-12-17 DIAGNOSIS — N182 Chronic kidney disease, stage 2 (mild): Secondary | ICD-10-CM | POA: Diagnosis not present

## 2019-12-17 MED ORDER — CITALOPRAM HYDROBROMIDE 20 MG PO TABS
20.0000 mg | ORAL_TABLET | Freq: Every day | ORAL | 2 refills | Status: DC
Start: 2019-12-17 — End: 2022-04-02

## 2019-12-17 MED ORDER — NICOTINE 21 MG/24HR TD PT24: 21.0000 mg | MEDICATED_PATCH | Freq: Every day | TRANSDERMAL | 0 refills | Status: DC

## 2019-12-17 MED ORDER — TAMOXIFEN CITRATE 20 MG PO TABS
20.0000 mg | ORAL_TABLET | Freq: Every day | ORAL | 1 refills | Status: DC
Start: 2019-12-17 — End: 2020-03-27

## 2019-12-17 NOTE — Progress Notes (Signed)
Received Medical Clearance from Endocrinologist Dr Mee Hives. The patient is cleared at medium risk for surgery. States "Ok from a diabetes standpoint to proceed. Recommend insulin drip in in ICU after surgery." Clearance has been faxed to Pre Admit testing.

## 2019-12-17 NOTE — Patient Instructions (Addendum)
You have requested that Dr. [name] to complete FMLA papers. Please contact your employment and have these forms faxed to [phone].  # STOP PROZAC;  START Celexa tomorrow   # 24 hours home O2- 2lits/min  # Start tamoxifen [breast cancer pill]  # Use nebulizer 4 times a day.   # Call LUNG doctor ASAP re: COPD

## 2019-12-17 NOTE — Assessment & Plan Note (Addendum)
#  Early stage breast cancer clinical stage I-II [T2 N0] ER/PR positive HER-2 negative; surgery curently on hold sec to medical co-morbidties/ see below. Ok to start Tamoxifen "neo-adjuvant"-discussed the rationale of starting tamoxifen.  Discussed the mechanism of action potential side effects include but not limited to hot flashes joint pains etc.  # COPD exacerbation/symptomatic hold off steroids.  No signs of active infection.- 88 on RA; improved on 93 on 3 lits. Recommend nebs q 4 hours; follow up PCP/pulmonary.  Continue Advair albuterol.  Chest x-ray-December 16, 2019 reviewed; no acute process noted.  No concern for PE.  #Poorly controlled diabetes-on insulin; followed endocrinology.  #Peripheral neuropathy grade 2-3; patient goes on with a walker/cane at baseline.  Continue Lyrica.  Stable  #Stage III kidney disease-second diabetes-overall stable.  DISPOSITION: # follow up in 3 weeks; MDL labs- cbc/[id number];cea; ca-15-3- Dr.B  

## 2019-12-17 NOTE — Progress Notes (Signed)
one [place] NOTE  Patient Care Team: Pleas Koch, NP as PCP - General (Internal Medicine) Rosamaria Lints, MD (Inactive) as Referring Physician (Neurology) Alvester Chou, NP (Nurse Practitioner) Renato Shin, MD as Consulting Physician (Endocrinology) Theodore Demark, RN as Oncology Nurse Navigator Ronny Bacon, MD as Consulting Physician (General Surgery)  CHIEF COMPLAINTS/PURPOSE OF CONSULTATION: Breast cancer  #  Oncology History Overview Note  #April 2021-2.5 cm right breast mass [incidental-CT scan chest]  # April 2021- RIGHT BREAST [facility]; s/p ER- 51-90%; PR- 51-90%; Her- 2-NEG. G-2. [Dr.Rodenberg]  # DM- poorly controlled; Seizure disorder/Bipolar/TIA ; Diabetes PN- G-3 [cane/walker; falls]; COPD; OSA active smoker  # SURVIVORSHIP:   # GENETICS:   DIAGNOSIS:   STAGE:         ;  GOALS:  CURRENT/MOST RECENT THERAPY :      Carcinoma of lower-outer quadrant of right breast in female, estrogen receptor positive ([name])  11/12/2019 Initial Diagnosis   Carcinoma of lower-outer quadrant of right breast in female, estrogen receptor positive ([name])      HISTORY OF PRESENTING ILLNESS:  [name] 49 y.o.  female  with multiple medical problems include history of TIA, bipolar disorder morbid obesity poorly controlled diabetes with neuropathy-recently diagnosed breast cancer stage II ER/PR positive HER-2 negative.  Patient was evaluated by surgery; however surgery had to be on hold because of patient's comorbidities-poorly controlled diabetes; COPD etc.  Patient was evaluated in the emergency room yesterday for shortness of breath fatigue/confusion.-Patient had appropriate work-up without any causes.  Patient continues to have worsening fatigue.  Worsening cough.  Worsening shortness of breath.  She is currently on oxygen only at nighttime.  At 2 L.  Review of Systems  Constitutional: Negative for chills, diaphoresis, fever, malaise/fatigue and  weight loss.  HENT: Negative for nosebleeds and sore throat.   Eyes: Negative for double vision.  Respiratory: Positive for cough and shortness of breath. Negative for hemoptysis, sputum production and wheezing.   Cardiovascular: Negative for chest pain, palpitations, orthopnea and leg swelling.  Gastrointestinal: Negative for abdominal pain, blood in stool, constipation, diarrhea, heartburn, melena, nausea and vomiting.  Genitourinary: Negative for dysuria, frequency and urgency.  Musculoskeletal: Positive for back pain and joint pain.  Skin: Negative.  Negative for itching and rash.  Neurological: Positive for tingling. Negative for dizziness, focal weakness, weakness and headaches.  Endo/Heme/Allergies: Does not bruise/bleed easily.  Psychiatric/Behavioral: Negative for depression. The patient is nervous/anxious and has insomnia.      MEDICAL HISTORY:  Past Medical History:  Diagnosis Date  . Anxiety    takes Prozac daily  . Anxiety   . Aortic valve calcification   . Asthma    Advair and Spirva daily  . Asthma   . Bipolar disorder ([place])   . CAD (coronary artery disease)    a. LHC 11/2013 done for CP/fluid retention: mild disease in prox LAD, mild-mod disease in mRCA, EF 60% with normal LVEDP. b. Normal nuc 03/2016.  Marland Kitchen CHF (congestive heart failure) ([place])   . Chronic diastolic CHF (congestive heart failure) ([place])   . Chronic heart failure with preserved ejection fraction ([place]) 11/16/2018  . CKD (chronic kidney disease), stage II   . COPD (chronic obstructive pulmonary disease) (HCC)    a. nocturnal O2.  Marland Kitchen COPD (chronic obstructive pulmonary disease) ([place])   . Coronary artery disease   . Decreased urine stream   . Diabetes mellitus   . Diabetes mellitus without complication ([place])   . Dyspnea   .  Family history of adverse reaction to anesthesia    mom gets nauseated  . GERD (gastroesophageal reflux disease)    takes Pepcid daily  . History of blood clots    left leg 3-75yr ago   . Hyperlipidemia   . Hypertension   . Hypertriglyceridemia   . Inguinal hernia, left 01/2015  . Muscle spasm   . Open wound of genital labia   . Peripheral neuropathy   . RBBB   . Seizures ([facility]   . Sepsis ([facility] 01/19/2018  . Sinus tachycardia    a. persistent since 2009.  .Marland KitchenSmokers' cough ([facility]   . Stroke ([facility] 1989   left sided weakness  . TIA (transient ischemic attack)   . Tobacco abuse   . Vulvar abscess 01/23/2018    SURGICAL HISTORY: Past Surgical History:  Procedure Laterality Date  . BREAST BIOPSY Right 11/06/2019   u[place]ore path pending venus clip  . HERNIA REPAIR    . INCISION AND DRAINAGE ABSCESS N/A 01/26/2018   Procedure: INCISION AND DEBRIDEMENT OF VULVAR NECROTIZING SOFT TISSUE INFECTION;  Surgeon: WGreer Pickerel MD;  [place]  Service: General;  Laterality: N/A;  . INCISION AND DRAINAGE PERIRECTAL ABSCESS N/A 01/22/2018   Procedure: IRRIGATION AND DEBRIDEMENT LABIAL/VULVAR AREA;  Surgeon: BCoralie Keens MD;  [place]  Service: General;  Laterality: N/A;  . INCISION AND DRAINAGE PERIRECTAL ABSCESS N/A 01/29/2018   Procedure: IRRIGATION AND DEBRIDEMENT VULVA;  Surgeon: HExcell Seltzer MD;  [place] [facility]  Service: General;  Laterality: N/A;  . INGUINAL HERNIA REPAIR Left 04/08/2015   Procedure: OPEN LEFT INGUINAL HERNIA REPAIR WITH MESH;  Surgeon: ARalene Ok MD;  [place]  Service: General;  Laterality: Left;  . INSERTION OF MESH Left 04/08/2015   Procedure: INSERTION OF MESH;  Surgeon: ARalene Ok MD;  [place] [facility]  Service: General;  Laterality: Left;  . LAPAROSCOPY     Endometriosis  . LEFT HEART CATHETERIZATION WITH CORONARY ANGIOGRAM N/A 12/05/2013   Procedure: LEFT HEART CATHETERIZATION WITH CORONARY ANGIOGRAM;  Surgeon: JJettie Booze MD;  [facility];  Service: Cardiovascular;  Laterality: N/A;  . right kidney drained    . TEE WITHOUT CARDIOVERSION N/A 11/28/2013   Procedure: TRANSESOPHAGEAL  ECHOCARDIOGRAM (TEE);  Surgeon: PThayer Headings MD;  [facility];  Service: Cardiovascular;  Laterality: N/A;    SOCIAL HISTORY: Social History   Socioeconomic History  . Marital status: Married    Spouse name: Not on file  . Number of children: 2  . Years of education: Not on file  . Highest education level: Not on file  Occupational History  . Not on file  Tobacco Use  . Smoking status: Current Every Day Smoker    Packs/day: 1.00    Years: 36.00    Pack years: 36.00    Types: Cigarettes    Start date: 07/11/1981  . Smokeless tobacco: Never Used  . Tobacco comment: 1 ppd now  Substance and Sexual Activity  . Alcohol use: No  . Drug use: No  . Sexual activity: Not Currently  Other Topics Concern  . Not on file  Social History Narrative   ** Merged History Encounter **       [place]  Married but lives with Boyfriend - not legally separated   Disabled - for BiPolar, Seizure disorder, diabetes   Formerly worked at [employer] ppd; no alcohol. 2 step sons; no biologic  children.       Social Determinants of Health   Financial Resource Strain:   . Difficulty of Paying Living Expenses:   Food Insecurity:   . Worried About Charity fundraiser in the Last Year:   . Arboriculturist in the Last Year:   Transportation Needs:   . Film/video editor (Medical):   Marland Kitchen Lack of Transportation (Non-Medical):   Physical Activity:   . Days of Exercise per Week:   . Minutes of Exercise per Session:   Stress:   . Feeling of Stress :   Social Connections:   . Frequency of Communication with Friends and Family:   . Frequency of Social Gatherings with Friends and Family:   . Attends Religious Services:   . Active Member of Clubs or Organizations:   . Attends Archivist Meetings:   Marland Kitchen Marital Status:   Intimate Partner Violence:   . Fear of Current or Ex-Partner:   . Emotionally Abused:   Marland Kitchen Physically Abused:   . Sexually Abused:      FAMILY HISTORY: Family History  Problem Relation Age of Onset  . Venous thrombosis Brother   . Other Brother        BRAIN TUMOR  . Asthma Father   . Diabetes Father   . Coronary artery disease Mother   . Hypertension Mother   . Diabetes Mother   . Breast cancer Mother 33  . Asthma Sister   . Diabetes type II Brother     ALLERGIES:  is allergic to adhesive [tape]; metoprolol; montelukast; morphine sulfate; penicillins; prednisone; diltiazem; and gabapentin.  MEDICATIONS:  Current Outpatient Medications  Medication Sig Dispense Refill  . acetaminophen (TYLENOL) 500 MG tablet Take 1,000 mg by mouth every 4 (four) hours as needed for moderate pain.     Marland Kitchen albuterol (VENTOLIN HFA) 108 (90 Base) MCG/ACT inhaler Inhale 2 puffs into the lungs every 6 (six) hours as needed for wheezing or shortness of breath.    . ALPRAZolam (XANAX) 0.5 MG tablet Take 1 tablet (0.5 mg total) by mouth 2 (two) times daily as needed for anxiety. (Patient taking differently: Take 0.5 mg by mouth 3 (three) times daily. )  0  . aspirin EC 81 MG tablet Take 81 mg by mouth every morning.    . budesonide-formoterol (SYMBICORT) 80-4.5 MCG/ACT inhaler INHALE 2 PUFFS BY MOUTH EVERY 12 HOURS TO PREVENT COUGH OR WHEEZING *RINSE MOUTH AFTER EACH USE* (Patient taking differently: Inhale 2 puffs into the lungs in the morning and at bedtime. ) 10.2 g 3  . busPIRone (BUSPAR) 30 MG tablet Take 30 mg by mouth 2 (two) times daily.    . famotidine (PEPCID) 20 MG tablet TAKE 1 TABLET (20 MG TOTAL) BY MOUTH 2 (TWO) TIMES DAILY. FOR HEARTBURN. (Patient taking differently: Take 20 mg by mouth 2 (two) times daily. ) 180 tablet 1  . glucose blood (ONETOUCH VERIO) test strip 1 each by Other route daily. E11.9; E10.42 100 each 0  . Insulin Pen Needle 32G X 4 MM MISC Used to give insulin injections twice daily. 100 each 11  . insulin regular human CONCENTRATED (HUMULIN R) 500 UNIT/ML injection 480 units with breakfast, and 450 units with  supper 60 mL 11  . Insulin Syringe-Needle U-100 (INSULIN SYRINGE 1CC/30GX1/2") 30G X 1/2" 1 ML MISC 1 each by Does not apply route 2 (two) times daily. E11.9 200 each 0  . ipratropium-albuterol (DUONEB) 0.5-2.5 (3) MG/3ML SOLN Take 3 mLs  by nebulization every 4 (four) hours as needed (wheezing/shortness of breath). 360 mL 0  . metolazone (ZAROXOLYN) 5 MG tablet Take 5 mg by mouth daily.     Glory Rosebush Delica Lancets 82N MISC 1 each by Does not apply route 3 (three) times daily. Use to monitor glucose levels three times daily; E11.9; E10.42 100 each 11  . OXYGEN Inhale 2-4 L into the lungs at bedtime.     . potassium chloride 20 MEQ TBCR Take 20 mEq by mouth daily. 60 tablet 2  . pregabalin (LYRICA) 300 MG capsule Take 1 capsule (300 mg total) by mouth 2 (two) times daily. For neuropathy. 180 capsule 0  . rOPINIRole (REQUIP) 1 MG tablet TAKE 1 TABLET BY MOUTH EVERY NIGHT AT BEDTIME FOR RESTLESS LEGS. (Patient taking differently: Take 1 mg by mouth at bedtime. ) 90 tablet 1  . rosuvastatin (CRESTOR) 10 MG tablet Take 1 tablet (10 mg total) by mouth daily. 90 tablet 3  . spironolactone (ALDACTONE) 50 MG tablet TAKE 1 TABLET BY MOUTH ONCE A DAY (Patient taking differently: Take 50 mg by mouth daily. ) 90 tablet 1  . torsemide (DEMADEX) 20 MG tablet TAKE 1 TABLET BY MOUTH 4 TIMES DAILY (Patient taking differently: Take 20 mg by mouth 2 (two) times daily. ) 360 tablet 3  . traZODone (DESYREL) 100 MG tablet Take 200 mg by mouth at bedtime.     Marland Kitchen ULTICARE MINI PEN NEEDLES 31G X 6 MM MISC USE AS DIRECTED THREE TIMES DAILY 100 each 0  . citalopram (CELEXA) 20 MG tablet Take 1 tablet (20 mg total) by mouth daily. 30 tablet 2  . nicotine (NICODERM CQ - DOSED IN MG/24 HOURS) 21 mg/24hr patch Place 1 patch (21 mg total) onto the skin daily. 28 patch 0  . tamoxifen (NOLVADEX) 20 MG tablet Take 1 tablet (20 mg total) by mouth daily. 30 tablet 1   No current facility-administered medications for this visit.       Marland Kitchen  PHYSICAL EXAMINATION: ECOG PERFORMANCE STATUS: 0 - Asymptomatic  Vitals:   12/17/19 1051  BP: 138/66  Pulse: 81  Resp: 18  Temp: 97.6 F (36.4 C)  SpO2: (!) 87%   Filed Weights   12/17/19 1051  Weight: 283 lb (128.4 kg)    Physical Exam  Constitutional: She is oriented to person, place, and time and well-developed, well-nourished, and in no distress.  Patient is obese.  She is in a wheelchair.  Accompanied by husband.  She smells nicotine.  HENT:  Head: Normocephalic and atraumatic.  Mouth/Throat: Oropharynx is clear and moist. No oropharyngeal exudate.  Eyes: Pupils are equal, round, and reactive to light.  Cardiovascular: Normal rate and regular rhythm.  Pulmonary/Chest: No respiratory distress. She has no wheezes.  Decreased air entry bilaterally.  No wheeze or crackles.  Abdominal: Soft. Bowel sounds are normal. She exhibits no distension and no mass. There is no abdominal tenderness. There is no rebound and no guarding.  Musculoskeletal:        General: No tenderness or edema. Normal range of motion.     Cervical back: Normal range of motion and neck supple.  Neurological: She is alert and oriented to person, place, and time.  Skin: Skin is warm.  Psychiatric: Affect normal.  Anxious.     LABORATORY DATA:  I have reviewed the data as listed Lab Results  Component Value Date   WBC 14.2 (H) 12/16/2019   HGB 13.7 12/16/2019   HCT 42.0 12/16/2019  MCV 84.5 12/16/2019   PLT 197 12/16/2019   Recent Labs    09/23/19 1732 10/23/19 1619 11/18/19 0810 11/27/19 2214 12/16/19 1035  NA  --    < > 130* 133* 134*  K  --    < > 3.7 4.4 3.8  CL  --    < > 86* 95* 89*  CO2  --    < > '30 26 31  '$ GLUCOSE  --    < > 342* 407* 183*  BUN  --    < > 75* 57* 60*  CREATININE  --    < > 1.47* 1.79* 1.93*  CALCIUM  --    < > 9.4 8.8* 9.2  GFRNONAA  --    < > 41* 32* 30*  GFRAA  --    < > 48* 38* 34*  PROT 8.8*  --  9.0* 8.8* 8.8*  ALBUMIN 4.0  --  4.0 3.7 3.6  AST  40  --  31 34 27  ALT 44  --  34 35 26  ALKPHOS 125  --  104 116 114  BILITOT 1.1  --  0.9 1.0 1.2  BILIDIR 0.2  --   --   --   --   IBILI 0.9  --   --   --   --    < > = values in this interval not displayed.    RADIOGRAPHIC STUDIES: I have personally reviewed the radiological images as listed and agreed with the findings in the report. DG Chest 2 View  Result Date: 12/16/2019 CLINICAL DATA:  Weakness EXAM: CHEST - 2 VIEW COMPARISON:  11/27/2019 FINDINGS: Lungs are clear.  No pleural effusion or pneumothorax. The heart is normal in size. Mild degenerative changes of the visualized thoracolumbar spine. IMPRESSION: Normal chest radiographs. Electronically Signed   By: Julian Hy M.D.   On: 12/16/2019 11:46   DG Chest 2 View  Result Date: 11/27/2019 CLINICAL DATA:  Shortness of breath, mid chest pain for 2 weeks, right breast cancer EXAM: CHEST - 2 VIEW COMPARISON:  09/23/2019 FINDINGS: Frontal and lateral views of the chest demonstrate an unremarkable cardiac silhouette. No airspace disease, effusion, or pneumothorax. Eventration right hemidiaphragm again noted. No acute bony abnormalities. IMPRESSION: 1. No acute intrathoracic process. Electronically Signed   By: Randa Ngo M.D.   On: 11/27/2019 22:44   CT Head Wo Contrast  Result Date: 12/16/2019 CLINICAL DATA:  Left-sided weakness EXAM: CT HEAD WITHOUT CONTRAST TECHNIQUE: Contiguous axial images were obtained from the base of the skull through the vertex without intravenous contrast. COMPARISON:  09/23/2019 FINDINGS: Brain: No evidence of acute infarction, hemorrhage, hydrocephalus, extra-axial collection or mass lesion/mass effect. Vascular: No hyperdense vessel or unexpected calcification. Skull: Normal. Negative for fracture or focal lesion. Sinuses/Orbits: No acute finding. Other: Stable BB is noted within the left parietal scalp. IMPRESSION: No acute intracranial abnormality is noted. Electronically Signed   By: Inez Catalina M.D.    On: 12/16/2019 10:57    ASSESSMENT & PLAN:   Carcinoma of lower-outer quadrant of right breast in female, estrogen receptor positive ([place]) #Early stage breast cancer clinical stage I-II [T2 N0] ER/PR positive HER-2 negative; surgery curently on hold sec to medical co-morbidties/ see below. Ok to start Tamoxifen "neo-adjuvant"-discussed the rationale of starting tamoxifen.  Discussed the mechanism of action potential side effects include but not limited to hot flashes joint pains etc.  # COPD exacerbation/symptomatic hold off steroids.  No signs of active infection.- 88  on RA; improved on 93 on 3 lits. Recommend nebs q 4 hours; follow up PCP/pulmonary.  Continue Advair albuterol.  Chest x-ray-December 16, 2019 reviewed; no acute process noted.  No concern for PE.  #Poorly controlled diabetes-on insulin; followed endocrinology.  #Peripheral neuropathy grade 2-3; patient goes on with a walker/cane at baseline.  Continue Lyrica.  Stable  #Stage III kidney disease-second diabetes-overall stable.  DISPOSITION: # follow up in 3 weeks; MDL labs- cbc/cmp.ca27-29;cea; ca-15-3- Dr.B   # 40 minutes face-to-face with the patient discussing the above plan of care; more than 50% of time spent on prognosis/ natural history; counseling and coordination.   All questions were answered. The patient/family knows to call the clinic with any problems, questions or concerns.    Cammie Sickle, MD 12/17/2019 1:49 PM

## 2019-12-17 NOTE — Progress Notes (Signed)
Pt and husband in for follow up, pt seen in ED yesterday for dehydration, pt states received 2 bags of fluids, states oxygen sats were in 80's yesterday as well.  RN put pt on Oxygen @ 3l/m per [place] per Dr Aletha Halim order.

## 2019-12-18 ENCOUNTER — Telehealth: Payer: Self-pay | Admitting: Primary Care

## 2019-12-18 NOTE — Telephone Encounter (Signed)
[name] from [facility] said she needs for [place] to do a referral for patient to get wheelchair and oxygen tank bag. You can call her back @ [phone].

## 2019-12-18 NOTE — Telephone Encounter (Signed)
Message left for Almyra Free to return my call.  Need to know if we can send script for these items to [facility] and request fax number.

## 2019-12-18 NOTE — Telephone Encounter (Signed)
Message left for Almyra Free to return my call.  Need more information. Which medical supply company? We do not use a specific one that we use for this office and this information usually given to [place] to let us know where to sent this the script to.

## 2019-12-18 NOTE — Telephone Encounter (Signed)
[name] returned call. She said it will have to go medical equipment company. S

## 2019-12-19 ENCOUNTER — Other Ambulatory Visit: Payer: Self-pay | Admitting: *Deleted

## 2019-12-19 ENCOUNTER — Ambulatory Visit: Payer: Self-pay | Admitting: Surgery

## 2019-12-19 ENCOUNTER — Other Ambulatory Visit: Payer: Self-pay | Admitting: Surgery

## 2019-12-19 ENCOUNTER — Ambulatory Visit (INDEPENDENT_AMBULATORY_CARE_PROVIDER_SITE_OTHER): Payer: BC Managed Care – PPO | Admitting: Surgery

## 2019-12-19 ENCOUNTER — Other Ambulatory Visit: Payer: Medicare HMO

## 2019-12-19 ENCOUNTER — Inpatient Hospital Stay: Payer: Medicare HMO | Admitting: Internal Medicine

## 2019-12-19 ENCOUNTER — Other Ambulatory Visit: Payer: Self-pay

## 2019-12-19 ENCOUNTER — Telehealth: Payer: Self-pay | Admitting: Surgery

## 2019-12-19 ENCOUNTER — Encounter: Payer: Self-pay | Admitting: Surgery

## 2019-12-19 VITALS — BP 115/73 | HR 78 | Temp 97.7°F | Resp 18 | Ht 64.0 in | Wt 282.0 lb

## 2019-12-19 DIAGNOSIS — C50511 Malignant neoplasm of lower-outer quadrant of right female breast: Secondary | ICD-10-CM | POA: Diagnosis not present

## 2019-12-19 DIAGNOSIS — C50011 Malignant neoplasm of nipple and areola, right female breast: Secondary | ICD-10-CM

## 2019-12-19 DIAGNOSIS — Z170 Estrogen receptor positive status [ER+]: Secondary | ICD-10-CM

## 2019-12-19 IMAGING — DX DG CHEST 1V PORT
1 series · 1 of 1 positions shown · non-contrast
Comparison: 09/13/2018

CLINICAL DATA: Slurred speech, left-sided weakness

EXAM:
PORTABLE CHEST 1 VIEW

[chest ap]
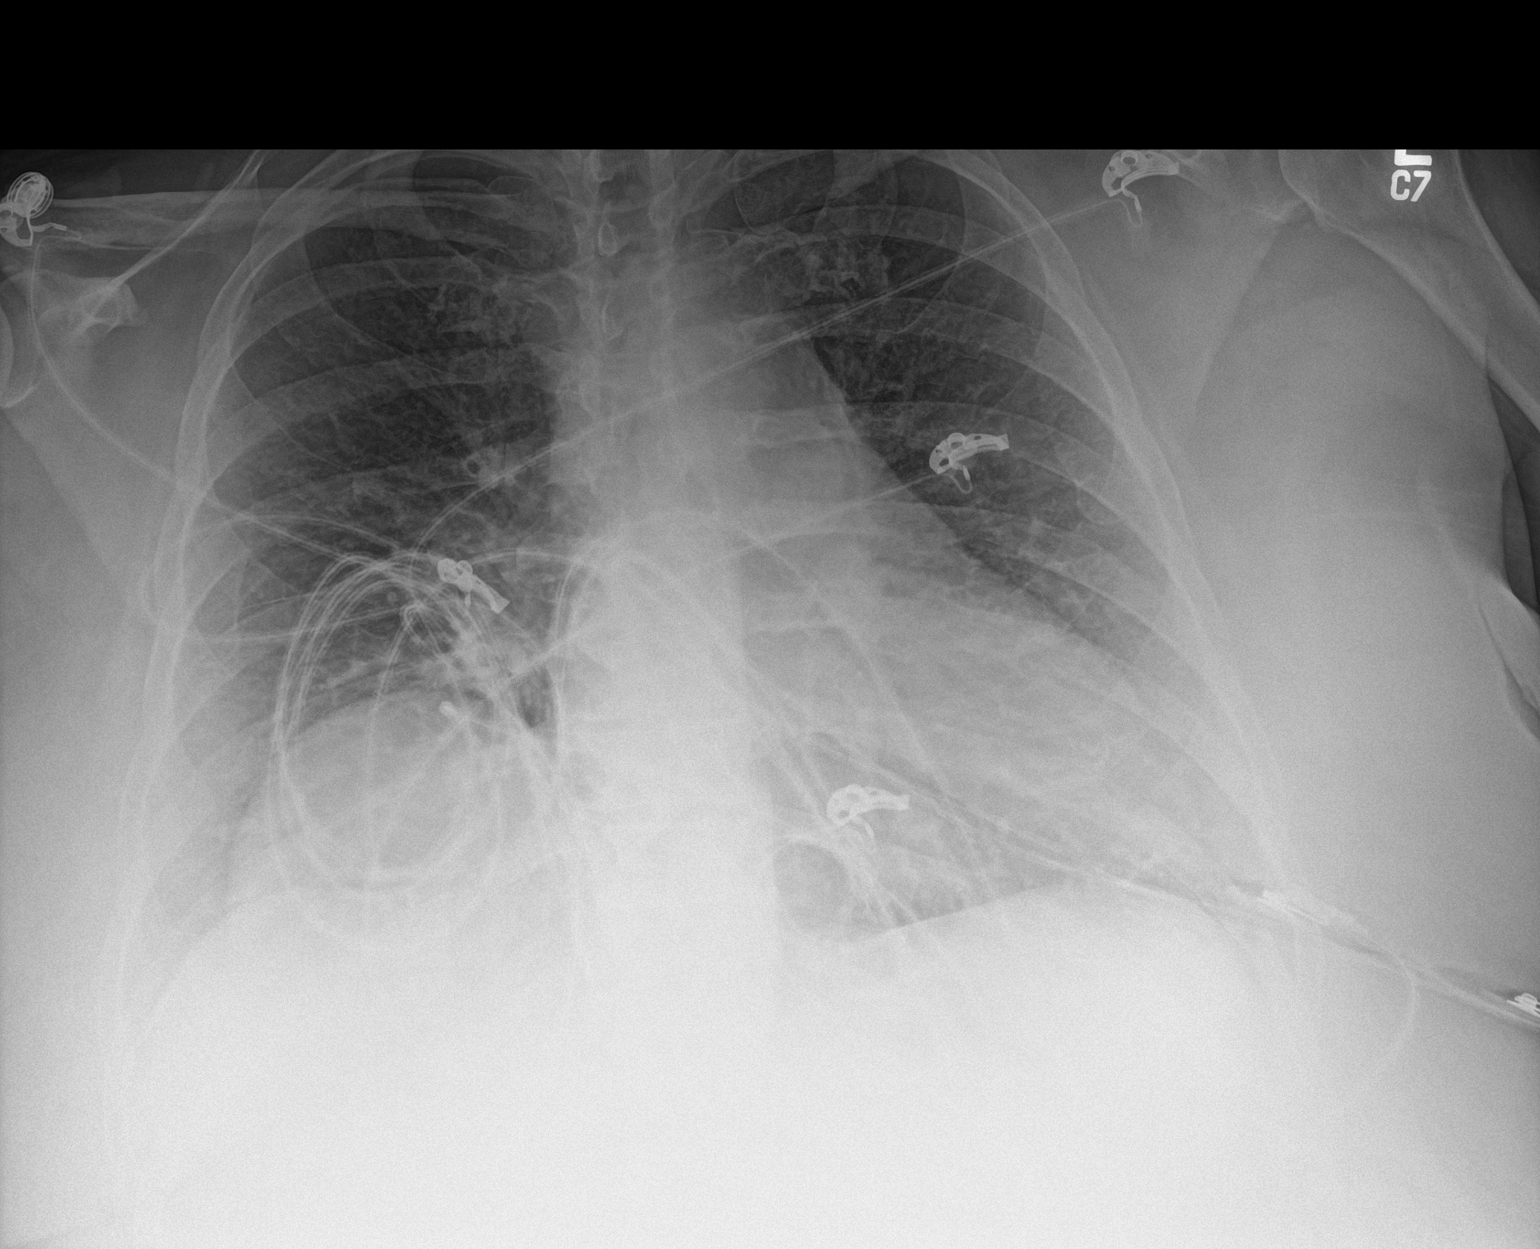

[1 of 1 positions shown; findings below may reference images not displayed]

FINDINGS: Cardiomegaly. Both lungs are clear. The visualized skeletal
structures are unremarkable.
IMPRESSION: Cardiomegaly without acute abnormality of the lungs in AP portable
projection.

## 2019-12-19 IMAGING — CT CT HEAD CODE STROKE
4 series · 16 of 47 positions shown, 18 images · non-contrast
Comparison: CT head 02/27/2019

CLINICAL DATA: Code stroke.  Left-sided deficit.  Slurred speech.

EXAM:
CT HEAD WITHOUT CONTRAST
TECHNIQUE: Contiguous axial images were obtained from the base of the skull
through the vertex without intravenous contrast.

[Series 3: head wo · axial · 0.41mm/px · z∈[+1144,+1258]mm · 7 of 31 slices shown, 9 images]
[im 4/31  brain]
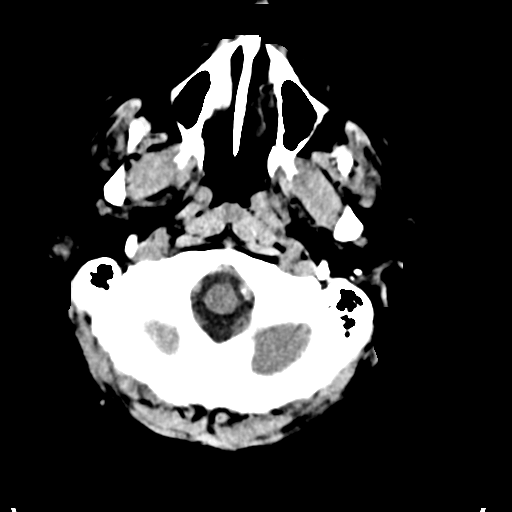
[im 4/31  bone]
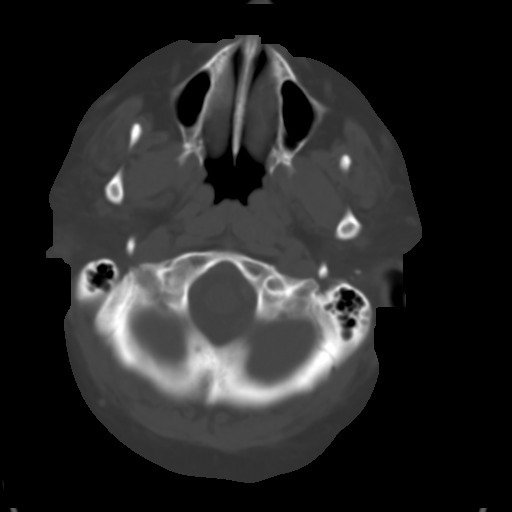
[im 8/31  brain]
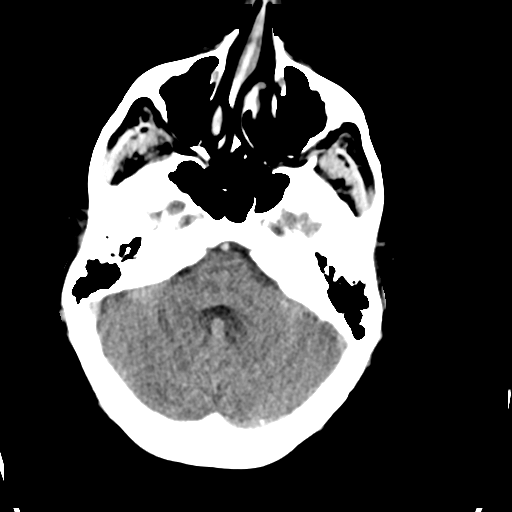
[im 12/31  brain]
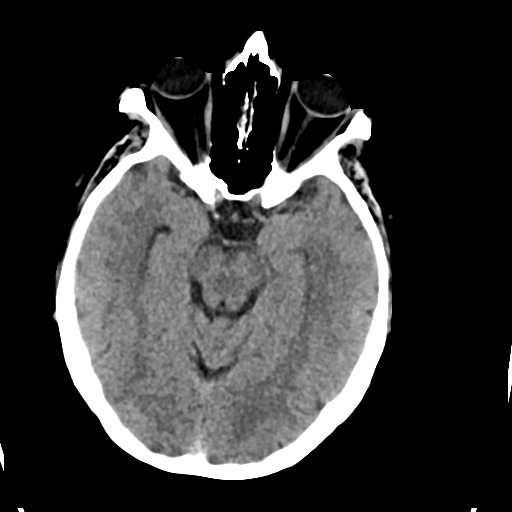
[im 16/31  brain]
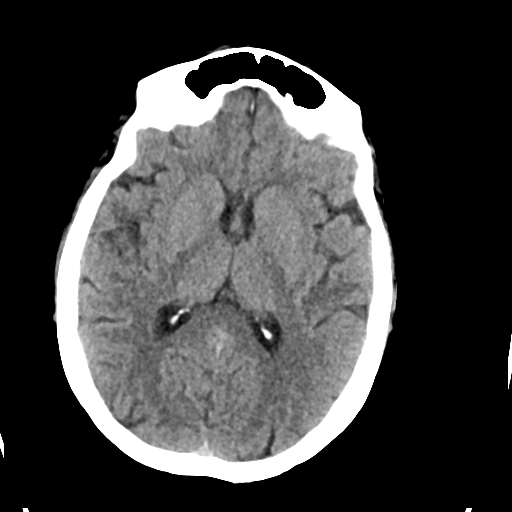
[im 19/31  brain]
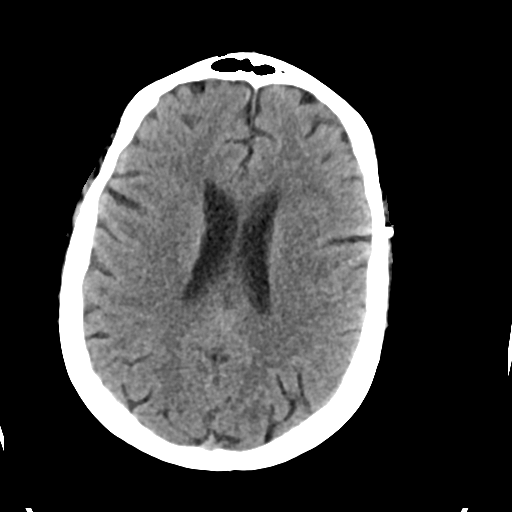
[im 19/31  bone]
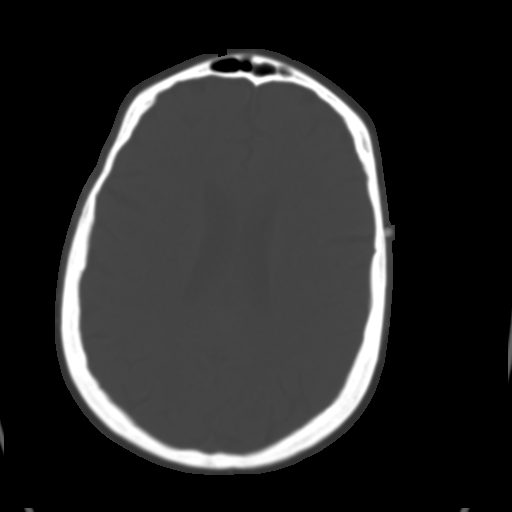
[im 23/31  brain]
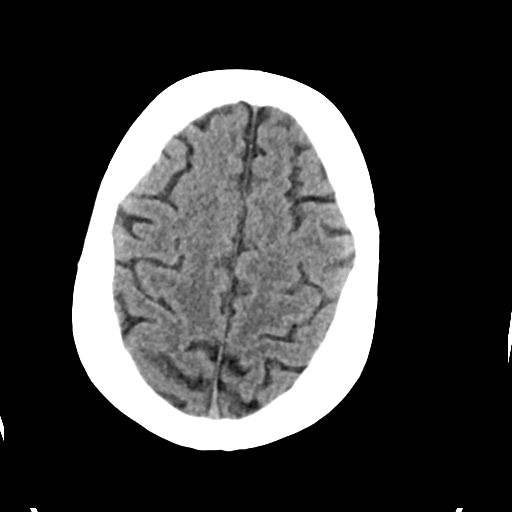
[im 27/31  brain]
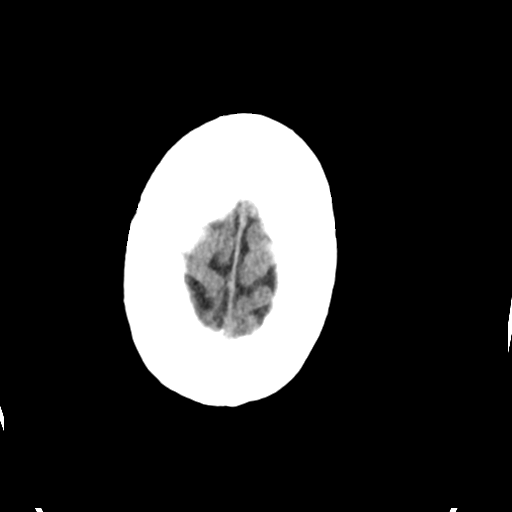

[Series 4: head bone · axial · 0.41mm/px · z∈[+1142,+1174]mm · 3 of 78 slices shown]
[im 8/78  bone]
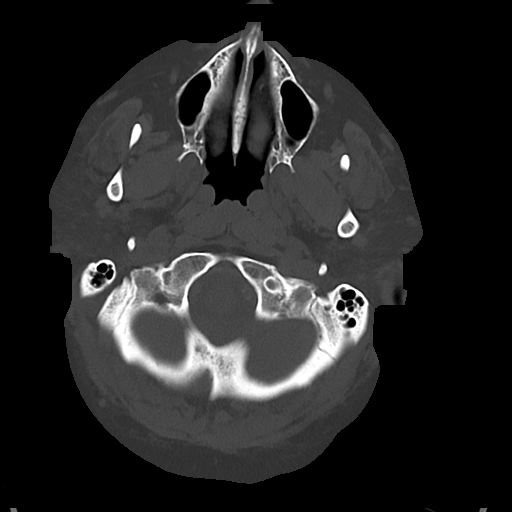
[im 16/78  bone]
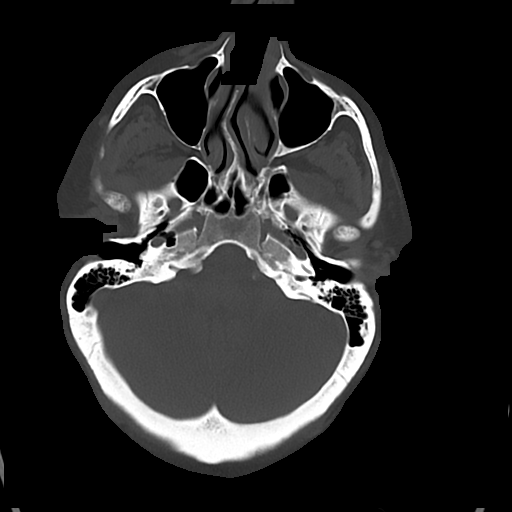
[im 24/78  bone]
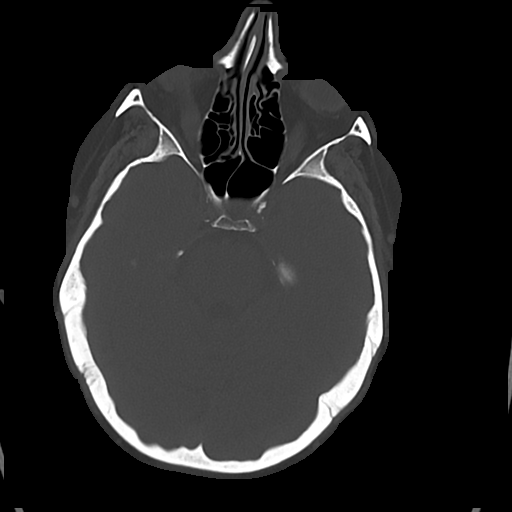

[Series 5: cor soft · coronal · 0.31mm/px · 3 of 70 slices shown]
[im 24/70  brain]
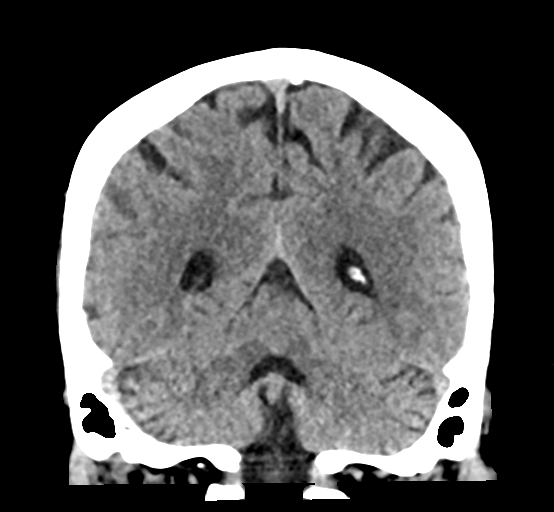
[im 31/70  brain]
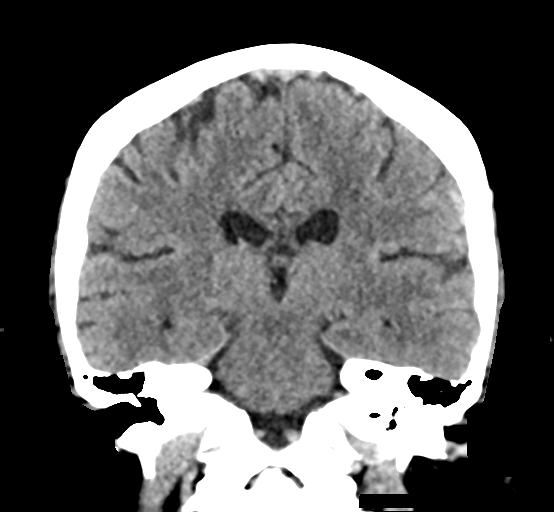
[im 39/70  brain]
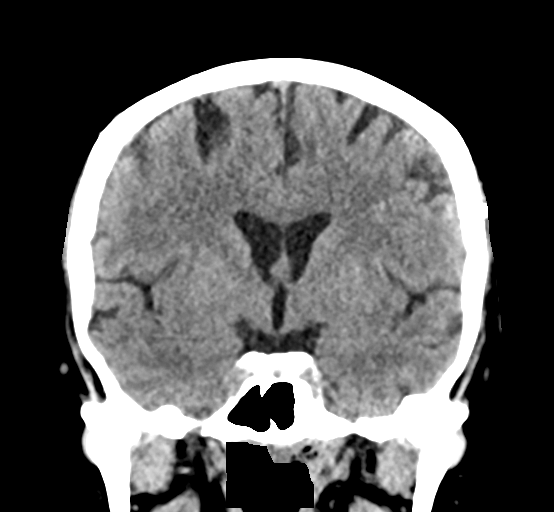

[Series 6: sag soft · sagittal · 0.31mm/px · 3 of 60 slices shown]
[im 20/60  brain]
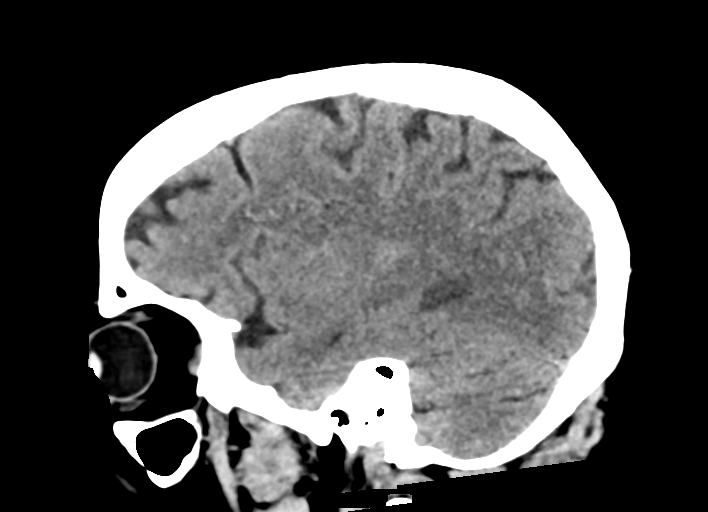
[im 30/60  brain]
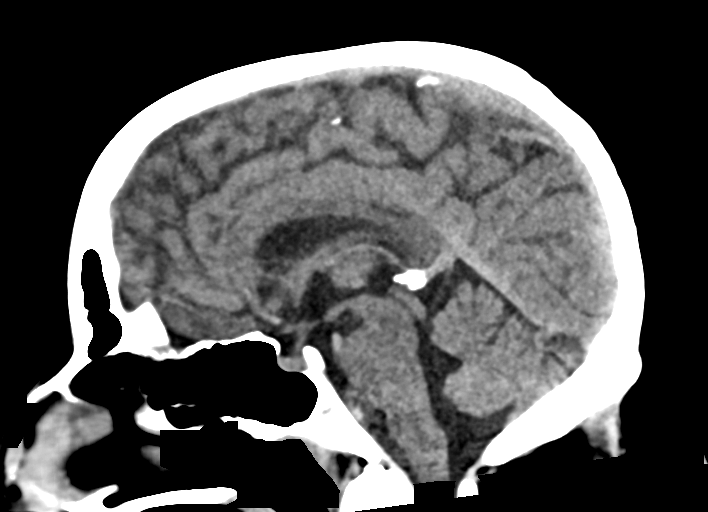
[im 40/60  brain]
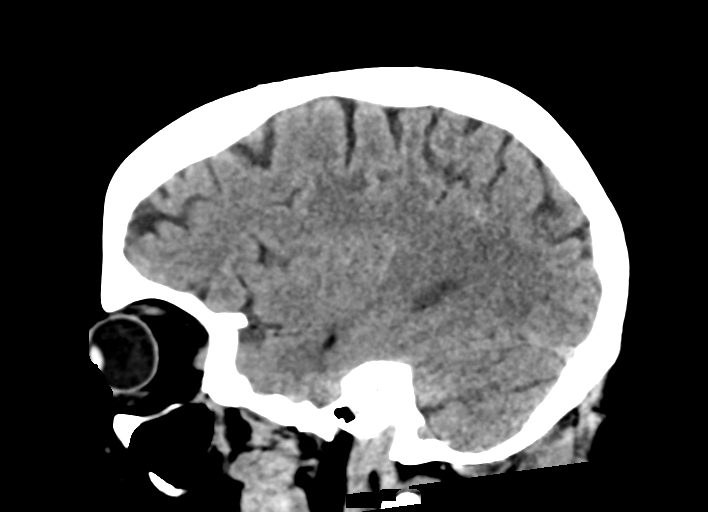

[16 of 47 positions shown; findings below may reference images not displayed]

FINDINGS: Brain: Ventricle size and cerebral volume normal. Negative for
infarct, hemorrhage, mass. No change from prior.

Vascular: Negative for hyperdense vessel

Skull: Negative

Sinuses/Orbits: Negative

Other: Spherical metal foreign body in the left frontal scalp
approximately 6 mm in diameter unchanged from prior study.

ASPECTS (Alberta Stroke Program Early CT Score)

- Ganglionic level infarction (caudate, lentiform nuclei, internal
capsule, insula, M1-M3 cortex): 7

- Supraganglionic infarction (M4-M6 cortex): 3

Total score (0-10 with 10 being normal): 10
IMPRESSION: 1. Negative CT head
2. ASPECTS is 10
3. These results were called by telephone at the time of
interpretation on 03/21/2019 at [DATE] to provider Heron , who
verbally acknowledged these results.

## 2019-12-19 NOTE — Telephone Encounter (Signed)
Updated surgery information.  Patient and husband  has been advised of Pre-Admission date/time, COVID Testing date and Surgery date.  Surgery Date: 12/23/19 Preadmission Testing Date: 12/23/19 (arrive 7:15 am) Covid Testing Date: 12/20/19 - patient advised to go to the [facility] ([place]) between 8a-1p   Patient has been advised to arrive at 7:15 am on 12/23/19.  It is explained to her that she will have SLN Bx done prior to her procedure with the doctor.  Patient and husband voices understanding.

## 2019-12-19 NOTE — Telephone Encounter (Addendum)
Called patient. According to patient, she had met her deductible. The wheelchair and oxygen tank bag can be sent to any medical supply company. Patient suggested [facility] and stated no need for face to face.   Patient is going to have surgery this upcoming Monday and wanted to make this done by the time she leave the hospital.   Wheelchair needs to be able to handle over 300 lbs

## 2019-12-19 NOTE — Patient Instructions (Signed)
Our surgery scheduler will call you within 24-48 hours. Please have the [facility] surgery sheet available when speaking with her.  Please call our office if you have any questions or concerns.  Total or Modified Radical Mastectomy, Care After This sheet gives you information about how to care for yourself after your procedure. Your health care provider may also give you more specific instructions. If you have problems or questions, contact your health care provider. What can I expect after the procedure? After the procedure, it is common to have:  Pain.  Numbness.  Stiffness in the arm or shoulder.  Feelings of stress, sadness, or depression. If the lymph nodes under your arm were removed, you may have arm swelling, weakness, or numbness on the same side of your body as your surgery. Follow these instructions at home: Incision care   Follow instructions from your health care provider about how to take care of your incision. Make sure you: ? Wash your hands with soap and water before you change your bandage (dressing). If soap and water are not available, use hand sanitizer. ? Change your dressing as told by your health care provider. ? Leave stitches (sutures), skin glue, or adhesive strips in place. These skin closures may need to stay in place for 2 weeks or longer. If adhesive strip edges start to loosen and curl up, you may trim the loose edges. Do not remove adhesive strips completely unless your health care provider tells you to do that.  Check your incision area every day for signs of infection. Check for: ? Redness, swelling, or more pain. ? Fluid or blood. ? Warmth. ? Pus or a bad smell.  If you were sent home with a surgical drain in place, follow instructions from your health care provider about emptying it. Bathing  Do not take baths, swim, or use a hot tub until your health care provider approves. Ask your health care provider if you may take showers. You may only be allowed  to take sponge baths. Activity  Return to your normal activities as told by your health care provider. Ask your health care provider what activities are safe for you.  Avoid activities that take a lot of effort.  Be careful to avoid any activities that could cause an injury to your arm on the side of your surgery.  Do not lift anything that is heavier than 10 lb (4.5 kg), or the limit that you are told, until your health care provider says that it is safe.  Avoid lifting with the arm on the side of your surgery.  Do not carry heavy objects on your shoulder.  After your drain is removed, do exercises to prevent stiffness and swelling in your arm. Talk with your health care provider about which exercises are safe for you. General instructions  Take over-the-counter and prescription medicines only as told by your health care provider.  You may eat what you usually do.  Keep your arm raised (elevated) above the level of your heart when you are sitting or lying down.  Do not wear tight jewelry on your arm, wrist, or fingers on the side of your surgery.  You may be given a tight sleeve (compression bandage) to wear over your arm on the side of your surgery. Wear this sleeve as told by your health care provider.  Ask your health care provider when you can start wearing a bra or using a breast prosthesis.  Before you are involved in certain procedures such  as giving blood or having your blood pressure checked, tell all your health care providers if lymph nodes under your arm were removed. This is important information. Follow-up  Keep all follow-up visits as told by your health care provider. This is important.  Get checked for extra fluid around your lymph nodes (lymphedema) as often as told by your health care provider. Contact a health care provider if:  You have a fever.  Your pain medicine is not working.  Your arm swelling, weakness, or numbness has not improved after a few  weeks.  You have new swelling in your breast area or arm.  You have redness, swelling, or more pain in your incision area.  You have fluid or blood coming from your incision.  Your incision feels warm to the touch.  You have pus or a bad smell coming from your incision. Get help right away if:  You have very bad pain in your breast area or arm.  You have chest pain.  You have difficulty breathing. Summary  Follow instructions from your health care provider about how to take care of your incision. Check your incision area every day for signs of infection.  Ask your health care provider what activities are safe for you.  Keep all follow-up visits as told by your health care provider. This is important.  Make sure you know which symptoms should cause you to contact your health care provider or to get help right away. This information is not intended to replace advice given to you by your health care provider. Make sure you discuss any questions you have with your health care provider. Document Revised: 08/31/2018 Document Reviewed: 03/31/2017 Elsevier Patient Education  2020 [employer].

## 2019-12-19 NOTE — Progress Notes (Signed)
Patient ID: [name], female   DOB: 20-Aug-1970, 49 y.o.   MRN: [id number]  Chief Complaint: Estrogen receptor positive right breast cancer  History of Present Illness [name] is a 49 y.o. female with a right breast mass diagnosed coincidentally on the chest CT scan exam.  Subsequently received her first mammogram, breast ultrasound and core biopsy of her breast mass.  Notable was her axillary ultrasound which we did not reveal any abnormal lymph nodes.  She still complains of some persisting pain in the recently biopsied breast/NAC.  She is currently taking Tylenol for this.  She denies any prior history of birth control or hormonal replacement therapy as she is still having menstrual periods, the last menstrual period about a week ago.  She does have a family breast cancer history involving her mother.  She began having menses at the age of 31.  She is gravida 2 but has had 2 miscarriages.  She has had no breast skin changes or discharge from her nipple.  I have brought this bit of information forward from the chart: [name] major active medical problems include: # CV Disease: hx of TIA, Sinus Tachycardia, HTN, HLD, DM, tobacco abuse, OSA, poor dentention - poorly controlled chronic diseases - Hospitalized in October 2014 for fatigue, areflexia, generalized weakness and chest pain             - Carotid ultrasound equals 1-39% carotid stenosis             - Echo = EF 55-65%, echodensity on aortic valve, TEE recommended # Uncontrolled DM: with peripheral neuropathy  Managed by Endocrinology - Buddy Duty  Most recent A1c (outside lab) 01/23/13 = 7.7 # Neuropsych: Seizure Disorder, Bipolar Disorder, Anxiety  Followed by Dr. Candis Schatz # RESP: COPD, Asthma, Sleep Apnea,  Past Medical History Past Medical History:  Diagnosis Date  . Anxiety    takes Prozac daily  . Anxiety   . Aortic valve calcification   . Asthma    Advair and Spirva daily  . Asthma   . Bipolar disorder ([place])   .  CAD (coronary artery disease)    a. LHC 11/2013 done for CP/fluid retention: mild disease in prox LAD, mild-mod disease in mRCA, EF 60% with normal LVEDP. b. Normal nuc 03/2016.  Marland Kitchen CHF (congestive heart failure) ([place])   . Chronic diastolic CHF (congestive heart failure) ([place])   . Chronic heart failure with preserved ejection fraction ([place]) 11/16/2018  . CKD (chronic kidney disease), stage II   . COPD (chronic obstructive pulmonary disease) (HCC)    a. nocturnal O2.  Marland Kitchen COPD (chronic obstructive pulmonary disease) ([place])   . Coronary artery disease   . Decreased urine stream   . Diabetes mellitus   . Diabetes mellitus without complication ([place])   . Dyspnea   . Family history of adverse reaction to anesthesia    mom gets nauseated  . GERD (gastroesophageal reflux disease)    takes Pepcid daily  . History of blood clots    left leg 3-88yrs ago  . Hyperlipidemia   . Hypertension   . Hypertriglyceridemia   . Inguinal hernia, left 01/2015  . Muscle spasm   . Open wound of genital labia   . Peripheral neuropathy   . RBBB   . Seizures ([place])   . Sepsis ([place]) 01/19/2018  . Sinus tachycardia    a. persistent since 2009.  Marland Kitchen Smokers' cough ([place])   . Stroke [facility]) 1989   left sided weakness  . TIA (  transient ischemic attack)   . Tobacco abuse   . Vulvar abscess 01/23/2018      Past Surgical History:  Procedure Laterality Date  . BREAST BIOPSY Right 11/06/2019   [place] core path pending venus clip  . HERNIA REPAIR    . INCISION AND DRAINAGE ABSCESS N/A 01/26/2018   Procedure: INCISION AND DEBRIDEMENT OF VULVAR NECROTIZING SOFT TISSUE INFECTION;  Surgeon: Greer Pickerel, MD;  Location: [facility];  Service: General;  Laterality: N/A;  . INCISION AND DRAINAGE PERIRECTAL ABSCESS N/A 01/22/2018   Procedure: IRRIGATION AND DEBRIDEMENT LABIAL/VULVAR AREA;  Surgeon: Coralie Keens, MD;  Location: [facility];  Service: General;  Laterality: N/A;  . INCISION AND DRAINAGE PERIRECTAL ABSCESS N/A 01/29/2018    Procedure: IRRIGATION AND DEBRIDEMENT VULVA;  Surgeon: Excell Seltzer, MD;  Location: [facility];  Service: General;  Laterality: N/A;  . INGUINAL HERNIA REPAIR Left 04/08/2015   Procedure: OPEN LEFT INGUINAL HERNIA REPAIR WITH MESH;  Surgeon: Ralene Ok, MD;  Location: [facility];  Service: General;  Laterality: Left;  . INSERTION OF MESH Left 04/08/2015   Procedure: INSERTION OF MESH;  Surgeon: Ralene Ok, MD;  Location: [facility];  Service: General;  Laterality: Left;  . LAPAROSCOPY     Endometriosis  . LEFT HEART CATHETERIZATION WITH CORONARY ANGIOGRAM N/A 12/05/2013   Procedure: LEFT HEART CATHETERIZATION WITH CORONARY ANGIOGRAM;  Surgeon: Jettie Booze, MD;  Location: [facility] CATH LAB;  Service: Cardiovascular;  Laterality: N/A;  . right kidney drained    . TEE WITHOUT CARDIOVERSION N/A 11/28/2013   Procedure: TRANSESOPHAGEAL ECHOCARDIOGRAM (TEE);  Surgeon: Thayer Headings, MD;  Location: [facility];  Service: Cardiovascular;  Laterality: N/A;    Allergies  Allergen Reactions  . Adhesive [Tape] Rash and Other (See Comments)    TAKES OFF THE SKIN (CERTAIN MEDICAL TAPES DO THIS!!)  . Metoprolol Shortness Of Breath    Occurrence of shortness of breath after 3 days  . Montelukast Shortness Of Breath  . Morphine Sulfate Anaphylaxis, Shortness Of Breath and Nausea And Vomiting    Swollen Throat - Able to tolerate dilaudid  . Other Other (See Comments) and Rash    TAKES OFF THE SKIN (CERTAIN MEDICAL TAPES DO THIS!!)  . Penicillins Anaphylaxis, Hives and Shortness Of Breath    Throat swells Has patient had a PCN reaction causing immediate rash, facial/tongue/throat swelling, SOB or lightheadedness with hypotension: Yes Has patient had a PCN reaction causing severe rash involving mucus membranes or skin necrosis: No Has patient had a PCN reaction that required hospitalization: Yes Has patient had a PCN reaction occurring within the last 10 years: No If all of the above answers are "NO",  then may proceed with Cephalosporin use.   . Prednisone Anaphylaxis  . Diltiazem Swelling  . Gabapentin Swelling    Current Outpatient Medications  Medication Sig Dispense Refill  . acetaminophen (TYLENOL) 500 MG tablet Take 1,000 mg by mouth every 4 (four) hours as needed for moderate pain.     Marland Kitchen albuterol (VENTOLIN HFA) 108 (90 Base) MCG/ACT inhaler Inhale 2 puffs into the lungs every 6 (six) hours as needed for wheezing or shortness of breath.    . ALPRAZolam (XANAX) 1 MG tablet Take 1 mg by mouth 4 (four) times daily as needed.    Marland Kitchen aspirin EC 81 MG tablet Take 81 mg by mouth every morning.    . budesonide-formoterol (SYMBICORT) 80-4.5 MCG/ACT inhaler INHALE 2 PUFFS BY MOUTH EVERY 12 HOURS TO PREVENT COUGH OR WHEEZING *RINSE MOUTH AFTER  EACH USE* (Patient taking differently: Inhale 2 puffs into the lungs in the morning and at bedtime. ) 10.2 g 3  . busPIRone (BUSPAR) 30 MG tablet Take 30 mg by mouth 2 (two) times daily.    . citalopram (CELEXA) 20 MG tablet Take 1 tablet (20 mg total) by mouth daily. 30 tablet 2  . Dapagliflozin-metFORMIN HCl ER 5-500 MG TB24 Take 1 tablet by mouth daily with breakfast.    . doxycycline (ADOXA) 100 MG tablet Take 100 mg by mouth 2 (two) times daily.    . famotidine (PEPCID) 20 MG tablet TAKE 1 TABLET (20 MG TOTAL) BY MOUTH 2 (TWO) TIMES DAILY. FOR HEARTBURN. (Patient taking differently: Take 20 mg by mouth 2 (two) times daily. ) 180 tablet 1  . FLUoxetine (PROZAC) 40 MG capsule Take 1 capsule by mouth daily.    Marland Kitchen glucose blood (ONETOUCH VERIO) test strip 1 each by Other route daily. E11.9; E10.42 100 each 0  . Insulin Pen Needle 32G X 4 MM MISC Used to give insulin injections twice daily. 100 each 11  . insulin regular human CONCENTRATED (HUMULIN R) 500 UNIT/ML injection 480 units with breakfast, and 450 units with supper 60 mL 11  . Insulin Syringe-Needle U-100 (INSULIN SYRINGE 1CC/30GX1/2") 30G X 1/2" 1 ML MISC 1 each by Does not apply route 2 (two)  times daily. E11.9 200 each 0  . ipratropium-albuterol (DUONEB) 0.5-2.5 (3) MG/3ML SOLN Take 3 mLs by nebulization every 4 (four) hours as needed (wheezing/shortness of breath). 360 mL 0  . metolazone (ZAROXOLYN) 5 MG tablet Take 5 mg by mouth daily.     . midodrine (PROAMATINE) 5 MG tablet Take by mouth.    . nicotine (NICODERM CQ - DOSED IN MG/24 HOURS) 21 mg/24hr patch Place 1 patch (21 mg total) onto the skin daily. 28 patch 0  . ondansetron (ZOFRAN) 4 MG tablet Take by mouth.    Glory Rosebush Delica Lancets 50Y MISC 1 each by Does not apply route 3 (three) times daily. Use to monitor glucose levels three times daily; E11.9; E10.42 100 each 11  . OXYGEN Inhale 2-4 L into the lungs at bedtime.     . potassium chloride 20 MEQ TBCR Take 20 mEq by mouth daily. 60 tablet 2  . pregabalin (LYRICA) 300 MG capsule Take 1 capsule (300 mg total) by mouth 2 (two) times daily. For neuropathy. 180 capsule 0  . rOPINIRole (REQUIP) 1 MG tablet TAKE 1 TABLET BY MOUTH EVERY NIGHT AT BEDTIME FOR RESTLESS LEGS. (Patient taking differently: Take 1 mg by mouth at bedtime. ) 90 tablet 1  . spironolactone (ALDACTONE) 50 MG tablet TAKE 1 TABLET BY MOUTH ONCE A DAY (Patient taking differently: Take 50 mg by mouth daily. ) 90 tablet 1  . tamoxifen (NOLVADEX) 20 MG tablet Take 1 tablet (20 mg total) by mouth daily. 30 tablet 1  . torsemide (DEMADEX) 20 MG tablet TAKE 1 TABLET BY MOUTH 4 TIMES DAILY (Patient taking differently: Take 20 mg by mouth 2 (two) times daily. ) 360 tablet 3  . traZODone (DESYREL) 100 MG tablet Take 200 mg by mouth at bedtime.     Marland Kitchen ULTICARE MINI PEN NEEDLES 31G X 6 MM MISC USE AS DIRECTED THREE TIMES DAILY 100 each 0  . XIGDUO XR 5-500 MG TB24 Take 1 tablet by mouth at bedtime.    . rosuvastatin (CRESTOR) 10 MG tablet Take 1 tablet (10 mg total) by mouth daily. 90 tablet 3   No  current facility-administered medications for this visit.    Family History Family History  Problem Relation Age of  Onset  . Venous thrombosis Brother   . Other Brother        BRAIN TUMOR  . Asthma Father   . Diabetes Father   . Coronary artery disease Mother   . Hypertension Mother   . Diabetes Mother   . Breast cancer Mother 6  . Asthma Sister   . Diabetes type II Brother       Social History Social History   Tobacco Use  . Smoking status: Current Every Day Smoker    Packs/day: 1.00    Years: 36.00    Pack years: 36.00    Types: Cigarettes    Start date: 07/11/1981  . Smokeless tobacco: Never Used  . Tobacco comment: 1 ppd now  Vaping Use  . Vaping Use: Former  Substance Use Topics  . Alcohol use: No  . Drug use: No        Review of Systems  Constitutional: Negative for weight loss.  HENT: Negative for hearing loss.   Eyes: Negative.   Respiratory: Negative for wheezing.   Cardiovascular: Negative for chest pain.  Gastrointestinal: Negative.   Genitourinary: Negative.   Musculoskeletal: Negative.   Skin: Negative.   Neurological: Negative.   Endo/Heme/Allergies: Negative.       Physical Exam Blood pressure 115/73, pulse 78, temperature 97.7 F (36.5 C), temperature source Oral, resp. rate 18, height 5\' 4"  (1.626 m), weight 282 lb (127.9 kg), SpO2 91 %. Last Weight  Most recent update: 12/19/2019 10:05 AM   Weight  127.9 kg (282 lb)            CONSTITUTIONAL: Well developed, and nourished, appropriately responsive and aware without distress.  Appears chronically ill, bright alert interactive, presents in wheelchair. EYES: Sclera non-icteric.   EARS, NOSE, MOUTH AND THROAT: Mask worn.    Hearing is intact to voice.  NECK: Trachea is midline, and there is no jugular venous distension.  LYMPH NODES:  Lymph nodes in the neck are not appreciable. RESPIRATORY:  Lungs are clear, end inspiratory rhonchi noticed on left base, no wheezing appreciated and breath sounds are equal bilaterally. Normal respiratory effort without pathologic use of accessory  muscles. CARDIOVASCULAR: Heart is regular in rate and rhythm. GI: The abdomen is well rounded, soft, nontender, and nondistended.  GU: The right breast shows in the inferior mid breast there is a vague mass/dimpling appreciable on exam. MUSCULOSKELETAL:  Symmetrical muscle tone appreciated in all four extremities.    SKIN: Skin turgor is normal. No pathologic skin lesions appreciated.  NEUROLOGIC:  Motor and sensation appear grossly normal.  Cranial nerves are grossly without defect. PSYCH:  Alert and oriented to person, place and time. Affect is appropriate for situation.  Data Reviewed I have personally reviewed what is currently available of the patient's imaging, recent labs and medical records.   Labs:  CBC Latest Ref Rng & Units 12/16/2019 11/27/2019 11/18/2019  WBC 4.0 - 10.5 K/uL 14.2(H) 12.2(H) 12.0(H)  Hemoglobin 12.0 - 15.0 g/dL 13.7 12.2 13.3  Hematocrit 36 - 46 % 42.0 37.8 40.2  Platelets 150 - 400 K/uL 197 196 192   CMP Latest Ref Rng & Units 12/16/2019 11/27/2019 11/18/2019  Glucose 70 - 99 mg/dL 183(H) 407(H) 342(H)  BUN 6 - 20 mg/dL 60(H) 57(H) 75(H)  Creatinine 0.44 - 1.00 mg/dL 1.93(H) 1.79(H) 1.47(H)  Sodium 135 - 145 mmol/L 134(L) 133(L) 130(L)  Potassium 3.5 -  5.1 mmol/L 3.8 4.4 3.7  Chloride 98 - 111 mmol/L 89(L) 95(L) 86(L)  CO2 22 - 32 mmol/L 31 26 30   Calcium 8.9 - 10.3 mg/dL 9.2 8.8(L) 9.4  Total Protein 6.5 - 8.1 g/dL 8.8(H) 8.8(H) 9.0(H)  Total Bilirubin 0.3 - 1.2 mg/dL 1.2 1.0 0.9  Alkaline Phos 38 - 126 U/L 114 116 104  AST 15 - 41 U/L 27 34 31  ALT 0 - 44 U/L 26 35 34      Imaging:  Within last 24 hrs: No results found.  Assessment    Right breast cancer. Patient Active Problem List   Diagnosis Date Noted  . Carcinoma of lower-outer quadrant of right breast in female, estrogen receptor positive ([place]) 11/12/2019  . Restrictive lung disease 10/08/2019  . Breast mass 10/07/2019  . Postmenopausal bleeding 08/30/2019  . Carotid stenosis,  asymptomatic, right 08/16/2019  . Nausea and vomiting 07/15/2019  . Cellulitis of left lower extremity 06/04/2019  . Multiple wounds of skin 06/04/2019  . Other injury of unspecified body region, initial encounter 06/04/2019  . Left-sided weakness 09/13/2018  . Weakness of left lower extremity 09/05/2018  . Recurrent falls 09/05/2018  . PAD (peripheral artery disease) ([place]) 08/27/2018  . Chronic congestive heart failure ([place]) 08/02/2018  . Vaginal itching 06/15/2018  . Foot pain, bilateral 05/22/2018  . Chronic upper extremity pain (Secondary Area of Pain) (Bilateral) 04/23/2018  . Diabetic polyneuropathy associated with type 1 diabetes mellitus ([place]) 04/23/2018  . Long term prescription benzodiazepine use 04/23/2018  . Neurogenic pain 04/23/2018  . Vitamin D insufficiency 01/29/2018  . Elevated C-reactive protein (CRP) 01/29/2018  . Elevated sedimentation rate 01/29/2018  . Pressure injury of skin 01/29/2018  . Poorly controlled type 2 diabetes mellitus ([place]) 01/23/2018  . DNR (do not resuscitate) 01/23/2018  . IBS (irritable bowel syndrome) 01/19/2018  . Elevated serum hCG 01/19/2018  . Chronic lower extremity pain (Primary Area of Pain) (Bilateral) 01/08/2018  . Chronic low back pain [facility] Area of Pain) (Bilateral) w/ sciatica (Bilateral) 01/08/2018  . Chronic pain syndrome 01/08/2018  . Pharmacologic therapy 01/08/2018  . Disorder of skeletal system 01/08/2018  . Problems influencing health status 01/08/2018  . Long term current use of opiate analgesic 01/08/2018  . Restless leg syndrome 08/02/2017  . Nocturnal hypoxemia 03/29/2017  . Vision disturbance 02/28/2017  . CKD (chronic kidney disease), stage II 02/28/2017  . Visual loss, bilateral   . Chronic respiratory failure with hypoxia (HCC)/ nocturnal 02 dep  10/28/2016  . Upper airway cough syndrome 08/22/2016  . Cigarette smoker 06/15/2016  . Simple chronic bronchitis ([place])   . Coronary artery disease involving  native heart without angina pectoris 05/16/2016  . Acute on chronic diastolic (congestive) heart failure ([place]) 11/04/2015  . Orthopnea 11/03/2015  . Bilateral leg edema   . Diabetes ([place])   . COPD exacerbation ([place]) 10/15/2015  . Fracture of rib, closed 10/15/2015  . Venous stasis of both lower extremities 03/29/2015  . Preventative health care 02/04/2015  . Essential hypertension 01/02/2015  . Inguinal hernia 11/27/2014  . Allergic rhinitis with postnasal drip 01/21/2014  . Aortic valve disorders 04/18/2013  . Sleep apnea 05/22/2012  . Seizure disorder ([place]) 10/09/2011  . Bipolar disorder ([place]) 10/09/2011  . TIA (transient ischemic attack) 06/22/2011  . Constipation 06/15/2011  . HYPERTRIGLYCERIDEMIA 07/17/2007  . Situational anxiety 05/30/2007  . Asthma-COPD overlap syndrome ([place]) 05/30/2007  . Morbid (severe) obesity due to excess calories ([place]) 04/23/2007  . Tobacco abuse 09/07/2006  Plan    Right simple mastectomy with sentinel lymph node biopsy. We reviewed again:  I discussed the available options with the patient. The risk of recurrence is similar between mastectomy and lumpectomy with radiation.  I also discussed that given the size of the cancer would recommend simple mastectomy, which appears to be her preference thus avoiding postoperative radiation, well excepting a slightly longer anesthetic.  We discussed she is not a good risk for additional reconstruction.  I also discussed that we would need to do a sentinel lymph node biopsy to check the nodes.   Explained to the patient that after her surgical treatment additional treatment will depend on her prognostic indicators and stage.    I discussed risks of bleeding, infection, damage to surrounding tissues, having positive margins, needing further resection, damage to nerves causing arm numbness or difficulty raising arm, causing lymphedema in the arm; as well as anesthesia risks of MI, stroke, prolonged ventilation,  pulmonary embolism, thrombosis and even death.   Patient was given the opportunity to ask questions and have them answered.  They would like to proceed with right breast simple mastectomy with sentinel lymph node biopsy.   Face-to-face time spent with the patient and accompanying care providers(if present) was 25 minutes, with more than 50% of the time spent counseling, educating, and coordinating care of the patient.      Ronny Bacon M.D., FACS 12/19/2019, 10:29 AM

## 2019-12-19 NOTE — Telephone Encounter (Signed)
Patient called office because she was told to. Patient stated that [facility] cannot provide her with a wheelchair. I have inform patient to call insurance to check where else we can sent the order.  Anda Kraft, does patient needs an appointment?

## 2019-12-19 NOTE — Telephone Encounter (Signed)
Patient returned Chan's call. Patient can be reached at [phone].

## 2019-12-19 NOTE — H&P (View-Only) (Signed)
Patient ID: [name], female   DOB: Nov 19, 1970, 49 y.o.   MRN: [id number]  Chief Complaint: Estrogen receptor positive right breast cancer  History of Present Illness [name] is a 49 y.o. female with a right breast mass diagnosed coincidentally on the chest CT scan exam.  Subsequently received her first mammogram, breast ultrasound and core biopsy of her breast mass.  Notable was her axillary ultrasound which we did not reveal any abnormal lymph nodes.  She still complains of some persisting pain in the recently biopsied breast/NAC.  She is currently taking Tylenol for this.  She denies any prior history of birth control or hormonal replacement therapy as she is still having menstrual periods, the last menstrual period about a week ago.  She does have a family breast cancer history involving her mother.  She began having menses at the age of 39.  She is gravida 2 but has had 2 miscarriages.  She has had no breast skin changes or discharge from her nipple.  I have brought this bit of information forward from the chart: [name]'s major active medical problems include: # CV Disease: hx of TIA, Sinus Tachycardia, HTN, HLD, DM, tobacco abuse, OSA, poor dentention - poorly controlled chronic diseases - Hospitalized in October 2014 for fatigue, areflexia, generalized weakness and chest pain             - Carotid ultrasound equals 1-39% carotid stenosis             - Echo = EF 55-65%, echodensity on aortic valve, TEE recommended # Uncontrolled DM: with peripheral neuropathy  Managed by Endocrinology - Buddy Duty  Most recent A1c (outside lab) 01/23/13 = 7.7 # Neuropsych: Seizure Disorder, Bipolar Disorder, Anxiety  Followed by Dr. Candis Schatz # RESP: COPD, Asthma, Sleep Apnea,  Past Medical History Past Medical History:  Diagnosis Date  . Anxiety    takes Prozac daily  . Anxiety   . Aortic valve calcification   . Asthma    Advair and Spirva daily  . Asthma   . Bipolar disorder ([place])   .  CAD (coronary artery disease)    a. LHC 11/2013 done for CP/fluid retention: mild disease in prox LAD, mild-mod disease in mRCA, EF 60% with normal LVEDP. b. Normal nuc 03/2016.  Marland Kitchen CHF (congestive heart failure) ([place])   . Chronic diastolic CHF (congestive heart failure) ([place])   . Chronic heart failure with preserved ejection fraction ([place]) 11/16/2018  . CKD (chronic kidney disease), stage II   . COPD (chronic obstructive pulmonary disease) (HCC)    a. nocturnal O2.  Marland Kitchen COPD (chronic obstructive pulmonary disease) ([place])   . Coronary artery disease   . Decreased urine stream   . Diabetes mellitus   . Diabetes mellitus without complication ([place])   . Dyspnea   . Family history of adverse reaction to anesthesia    mom gets nauseated  . GERD (gastroesophageal reflux disease)    takes Pepcid daily  . History of blood clots    left leg 3-73yrs ago  . Hyperlipidemia   . Hypertension   . Hypertriglyceridemia   . Inguinal hernia, left 01/2015  . Muscle spasm   . Open wound of genital labia   . Peripheral neuropathy   . RBBB   . Seizures ([place])   . Sepsis ([place]) 01/19/2018  . Sinus tachycardia    a. persistent since 2009.  Marland Kitchen Smokers' cough ([place])   . Stroke [facility]) 1989   left sided weakness  . TIA (  transient ischemic attack)   . Tobacco abuse   . Vulvar abscess 01/23/2018      Past Surgical History:  Procedure Laterality Date  . BREAST BIOPSY Right 11/06/2019   [place] core path pending venus clip  . HERNIA REPAIR    . INCISION AND DRAINAGE ABSCESS N/A 01/26/2018   Procedure: INCISION AND DEBRIDEMENT OF VULVAR NECROTIZING SOFT TISSUE INFECTION;  Surgeon: Greer Pickerel, MD;  Location: [place];  Service: General;  Laterality: N/A;  . INCISION AND DRAINAGE PERIRECTAL ABSCESS N/A 01/22/2018   Procedure: IRRIGATION AND DEBRIDEMENT LABIAL/VULVAR AREA;  Surgeon: Coralie Keens, MD;  Location: [place];  Service: General;  Laterality: N/A;  . INCISION AND DRAINAGE PERIRECTAL ABSCESS N/A 01/29/2018    Procedure: IRRIGATION AND DEBRIDEMENT VULVA;  Surgeon: Excell Seltzer, MD;  Location: [place];  Service: General;  Laterality: N/A;  . INGUINAL HERNIA REPAIR Left 04/08/2015   Procedure: OPEN LEFT INGUINAL HERNIA REPAIR WITH MESH;  Surgeon: Ralene Ok, MD;  Location: [place];  Service: General;  Laterality: Left;  . INSERTION OF MESH Left 04/08/2015   Procedure: INSERTION OF MESH;  Surgeon: Ralene Ok, MD;  Location: [place];  Service: General;  Laterality: Left;  . LAPAROSCOPY     Endometriosis  . LEFT HEART CATHETERIZATION WITH CORONARY ANGIOGRAM N/A 12/05/2013   Procedure: LEFT HEART CATHETERIZATION WITH CORONARY ANGIOGRAM;  Surgeon: Jettie Booze, MD;  [facility];  Service: Cardiovascular;  Laterality: N/A;  . right kidney drained    . TEE WITHOUT CARDIOVERSION N/A 11/28/2013   Procedure: TRANSESOPHAGEAL ECHOCARDIOGRAM (TEE);  Surgeon: Thayer Headings, MD;  Location: [place];  Service: Cardiovascular;  Laterality: N/A;    Allergies  Allergen Reactions  . Adhesive [Tape] Rash and Other (See Comments)    TAKES OFF THE SKIN (CERTAIN MEDICAL TAPES DO THIS!!)  . Metoprolol Shortness Of Breath    Occurrence of shortness of breath after 3 days  . Montelukast Shortness Of Breath  . Morphine Sulfate Anaphylaxis, Shortness Of Breath and Nausea And Vomiting    Swollen Throat - Able to tolerate dilaudid  . Other Other (See Comments) and Rash    TAKES OFF THE SKIN (CERTAIN MEDICAL TAPES DO THIS!!)  . Penicillins Anaphylaxis, Hives and Shortness Of Breath    Throat swells Has patient had a PCN reaction causing immediate rash, facial/tongue/throat swelling, SOB or lightheadedness with hypotension: Yes Has patient had a PCN reaction causing severe rash involving mucus membranes or skin necrosis: No Has patient had a PCN reaction that required hospitalization: Yes Has patient had a PCN reaction occurring within the last 10 years: No If all of the above answers are "NO",  then may proceed with Cephalosporin use.   . Prednisone Anaphylaxis  . Diltiazem Swelling  . Gabapentin Swelling    Current Outpatient Medications  Medication Sig Dispense Refill  . acetaminophen (TYLENOL) 500 MG tablet Take 1,000 mg by mouth every 4 (four) hours as needed for moderate pain.     Marland Kitchen albuterol (VENTOLIN HFA) 108 (90 Base) MCG/ACT inhaler Inhale 2 puffs into the lungs every 6 (six) hours as needed for wheezing or shortness of breath.    . ALPRAZolam (XANAX) 1 MG tablet Take 1 mg by mouth 4 (four) times daily as needed.    Marland Kitchen aspirin EC 81 MG tablet Take 81 mg by mouth every morning.    . budesonide-formoterol (SYMBICORT) 80-4.5 MCG/ACT inhaler INHALE 2 PUFFS BY MOUTH EVERY 12 HOURS TO PREVENT COUGH OR WHEEZING *RINSE MOUTH AFTER  EACH USE* (Patient taking differently: Inhale 2 puffs into the lungs in the morning and at bedtime. ) 10.2 g 3  . busPIRone (BUSPAR) 30 MG tablet Take 30 mg by mouth 2 (two) times daily.    . citalopram (CELEXA) 20 MG tablet Take 1 tablet (20 mg total) by mouth daily. 30 tablet 2  . Dapagliflozin-metFORMIN HCl ER 5-500 MG TB24 Take 1 tablet by mouth daily with breakfast.    . doxycycline (ADOXA) 100 MG tablet Take 100 mg by mouth 2 (two) times daily.    . famotidine (PEPCID) 20 MG tablet TAKE 1 TABLET (20 MG TOTAL) BY MOUTH 2 (TWO) TIMES DAILY. FOR HEARTBURN. (Patient taking differently: Take 20 mg by mouth 2 (two) times daily. ) 180 tablet 1  . FLUoxetine (PROZAC) 40 MG capsule Take 1 capsule by mouth daily.    Marland Kitchen glucose blood (ONETOUCH VERIO) test strip 1 each by Other route daily. E11.9; E10.42 100 each 0  . Insulin Pen Needle 32G X 4 MM MISC Used to give insulin injections twice daily. 100 each 11  . insulin regular human CONCENTRATED (HUMULIN R) 500 UNIT/ML injection 480 units with breakfast, and 450 units with supper 60 mL 11  . Insulin Syringe-Needle U-100 (INSULIN SYRINGE 1CC/30GX1/2") 30G X 1/2" 1 ML MISC 1 each by Does not apply route 2 (two)  times daily. E11.9 200 each 0  . ipratropium-albuterol (DUONEB) 0.5-2.5 (3) MG/3ML SOLN Take 3 mLs by nebulization every 4 (four) hours as needed (wheezing/shortness of breath). 360 mL 0  . metolazone (ZAROXOLYN) 5 MG tablet Take 5 mg by mouth daily.     . midodrine (PROAMATINE) 5 MG tablet Take by mouth.    . nicotine (NICODERM CQ - DOSED IN MG/24 HOURS) 21 mg/24hr patch Place 1 patch (21 mg total) onto the skin daily. 28 patch 0  . ondansetron (ZOFRAN) 4 MG tablet Take by mouth.    Glory Rosebush Delica Lancets 33I MISC 1 each by Does not apply route 3 (three) times daily. Use to monitor glucose levels three times daily; E11.9; E10.42 100 each 11  . OXYGEN Inhale 2-4 L into the lungs at bedtime.     . potassium chloride 20 MEQ TBCR Take 20 mEq by mouth daily. 60 tablet 2  . pregabalin (LYRICA) 300 MG capsule Take 1 capsule (300 mg total) by mouth 2 (two) times daily. For neuropathy. 180 capsule 0  . rOPINIRole (REQUIP) 1 MG tablet TAKE 1 TABLET BY MOUTH EVERY NIGHT AT BEDTIME FOR RESTLESS LEGS. (Patient taking differently: Take 1 mg by mouth at bedtime. ) 90 tablet 1  . spironolactone (ALDACTONE) 50 MG tablet TAKE 1 TABLET BY MOUTH ONCE A DAY (Patient taking differently: Take 50 mg by mouth daily. ) 90 tablet 1  . tamoxifen (NOLVADEX) 20 MG tablet Take 1 tablet (20 mg total) by mouth daily. 30 tablet 1  . torsemide (DEMADEX) 20 MG tablet TAKE 1 TABLET BY MOUTH 4 TIMES DAILY (Patient taking differently: Take 20 mg by mouth 2 (two) times daily. ) 360 tablet 3  . traZODone (DESYREL) 100 MG tablet Take 200 mg by mouth at bedtime.     Marland Kitchen ULTICARE MINI PEN NEEDLES 31G X 6 MM MISC USE AS DIRECTED THREE TIMES DAILY 100 each 0  . XIGDUO XR 5-500 MG TB24 Take 1 tablet by mouth at bedtime.    . rosuvastatin (CRESTOR) 10 MG tablet Take 1 tablet (10 mg total) by mouth daily. 90 tablet 3   No  current facility-administered medications for this visit.    Family History Family History  Problem Relation Age of  Onset  . Venous thrombosis Brother   . Other Brother        BRAIN TUMOR  . Asthma Father   . Diabetes Father   . Coronary artery disease Mother   . Hypertension Mother   . Diabetes Mother   . Breast cancer Mother 66  . Asthma Sister   . Diabetes type II Brother       Social History Social History   Tobacco Use  . Smoking status: Current Every Day Smoker    Packs/day: 1.00    Years: 36.00    Pack years: 36.00    Types: Cigarettes    Start date: 07/11/1981  . Smokeless tobacco: Never Used  . Tobacco comment: 1 ppd now  Vaping Use  . Vaping Use: Former  Substance Use Topics  . Alcohol use: No  . Drug use: No        Review of Systems  Constitutional: Negative for weight loss.  HENT: Negative for hearing loss.   Eyes: Negative.   Respiratory: Negative for wheezing.   Cardiovascular: Negative for chest pain.  Gastrointestinal: Negative.   Genitourinary: Negative.   Musculoskeletal: Negative.   Skin: Negative.   Neurological: Negative.   Endo/Heme/Allergies: Negative.       Physical Exam Blood pressure 115/73, pulse 78, temperature 97.7 F (36.5 C), temperature source Oral, resp. rate 18, height 5\' 4"  (1.626 m), weight 282 lb (127.9 kg), SpO2 91 %. Last Weight  Most recent update: 12/19/2019 10:05 AM   Weight  127.9 kg (282 lb)            CONSTITUTIONAL: Well developed, and nourished, appropriately responsive and aware without distress.  Appears chronically ill, bright alert interactive, presents in wheelchair. EYES: Sclera non-icteric.   EARS, NOSE, MOUTH AND THROAT: Mask worn.    Hearing is intact to voice.  NECK: Trachea is midline, and there is no jugular venous distension.  LYMPH NODES:  Lymph nodes in the neck are not appreciable. RESPIRATORY:  Lungs are clear, end inspiratory rhonchi noticed on left base, no wheezing appreciated and breath sounds are equal bilaterally. Normal respiratory effort without pathologic use of accessory  muscles. CARDIOVASCULAR: Heart is regular in rate and rhythm. GI: The abdomen is well rounded, soft, nontender, and nondistended.  GU: The right breast shows in the inferior mid breast there is a vague mass/dimpling appreciable on exam. MUSCULOSKELETAL:  Symmetrical muscle tone appreciated in all four extremities.    SKIN: Skin turgor is normal. No pathologic skin lesions appreciated.  NEUROLOGIC:  Motor and sensation appear grossly normal.  Cranial nerves are grossly without defect. PSYCH:  Alert and oriented to person, place and time. Affect is appropriate for situation.  Data Reviewed I have personally reviewed what is currently available of the patient's imaging, recent labs and medical records.   Labs:  CBC Latest Ref Rng & Units 12/16/2019 11/27/2019 11/18/2019  WBC 4.0 - 10.5 K/uL 14.2(H) 12.2(H) 12.0(H)  Hemoglobin 12.0 - 15.0 g/dL 13.7 12.2 13.3  Hematocrit 36 - 46 % 42.0 37.8 40.2  Platelets 150 - 400 K/uL 197 196 192   CMP Latest Ref Rng & Units 12/16/2019 11/27/2019 11/18/2019  Glucose 70 - 99 mg/dL 183(H) 407(H) 342(H)  BUN 6 - 20 mg/dL 60(H) 57(H) 75(H)  Creatinine 0.44 - 1.00 mg/dL 1.93(H) 1.79(H) 1.47(H)  Sodium 135 - 145 mmol/L 134(L) 133(L) 130(L)  Potassium 3.5 -  5.1 mmol/L 3.8 4.4 3.7  Chloride 98 - 111 mmol/L 89(L) 95(L) 86(L)  CO2 22 - 32 mmol/L 31 26 30   Calcium 8.9 - 10.3 mg/dL 9.2 8.8(L) 9.4  Total Protein 6.5 - 8.1 g/dL 8.8(H) 8.8(H) 9.0(H)  Total Bilirubin 0.3 - 1.2 mg/dL 1.2 1.0 0.9  Alkaline Phos 38 - 126 U/L 114 116 104  AST 15 - 41 U/L 27 34 31  ALT 0 - 44 U/L 26 35 34      Imaging:  Within last 24 hrs: No results found.  Assessment    Right breast cancer. Patient Active Problem List   Diagnosis Date Noted  . Carcinoma of lower-outer quadrant of right breast in female, estrogen receptor positive ([place]) 11/12/2019  . Restrictive lung disease 10/08/2019  . Breast mass 10/07/2019  . Postmenopausal bleeding 08/30/2019  . Carotid stenosis,  asymptomatic, right 08/16/2019  . Nausea and vomiting 07/15/2019  . Cellulitis of left lower extremity 06/04/2019  . Multiple wounds of skin 06/04/2019  . Other injury of unspecified body region, initial encounter 06/04/2019  . Left-sided weakness 09/13/2018  . Weakness of left lower extremity 09/05/2018  . Recurrent falls 09/05/2018  . PAD (peripheral artery disease) ([place]) 08/27/2018  . Chronic congestive heart failure ([place]) 08/02/2018  . Vaginal itching 06/15/2018  . Foot pain, bilateral 05/22/2018  . Chronic upper extremity pain (Secondary Area of Pain) (Bilateral) 04/23/2018  . Diabetic polyneuropathy associated with type 1 diabetes mellitus ([place]) 04/23/2018  . Long term prescription benzodiazepine use 04/23/2018  . Neurogenic pain 04/23/2018  . Vitamin D insufficiency 01/29/2018  . Elevated C-reactive protein (CRP) 01/29/2018  . Elevated sedimentation rate 01/29/2018  . Pressure injury of skin 01/29/2018  . Poorly controlled type 2 diabetes mellitus ([place]) 01/23/2018  . DNR (do not resuscitate) 01/23/2018  . IBS (irritable bowel syndrome) 01/19/2018  . Elevated serum hCG 01/19/2018  . Chronic lower extremity pain (Primary Area of Pain) (Bilateral) 01/08/2018  . Chronic low back pain [facility] Area of Pain) (Bilateral) w/ sciatica (Bilateral) 01/08/2018  . Chronic pain syndrome 01/08/2018  . Pharmacologic therapy 01/08/2018  . Disorder of skeletal system 01/08/2018  . Problems influencing health status 01/08/2018  . Long term current use of opiate analgesic 01/08/2018  . Restless leg syndrome 08/02/2017  . Nocturnal hypoxemia 03/29/2017  . Vision disturbance 02/28/2017  . CKD (chronic kidney disease), stage II 02/28/2017  . Visual loss, bilateral   . Chronic respiratory failure with hypoxia (HCC)/ nocturnal 02 dep  10/28/2016  . Upper airway cough syndrome 08/22/2016  . Cigarette smoker 06/15/2016  . Simple chronic bronchitis ([place])   . Coronary artery disease involving  native heart without angina pectoris 05/16/2016  . Acute on chronic diastolic (congestive) heart failure ([place]) 11/04/2015  . Orthopnea 11/03/2015  . Bilateral leg edema   . Diabetes ([place])   . COPD exacerbation ([place]) 10/15/2015  . Fracture of rib, closed 10/15/2015  . Venous stasis of both lower extremities 03/29/2015  . Preventative health care 02/04/2015  . Essential hypertension 01/02/2015  . Inguinal hernia 11/27/2014  . Allergic rhinitis with postnasal drip 01/21/2014  . Aortic valve disorders 04/18/2013  . Sleep apnea 05/22/2012  . Seizure disorder ([place]) 10/09/2011  . Bipolar disorder ([place]) 10/09/2011  . TIA (transient ischemic attack) 06/22/2011  . Constipation 06/15/2011  . HYPERTRIGLYCERIDEMIA 07/17/2007  . Situational anxiety 05/30/2007  . Asthma-COPD overlap syndrome ([place]) 05/30/2007  . Morbid (severe) obesity due to excess calories ([place]) 04/23/2007  . Tobacco abuse 09/07/2006  Plan    Right simple mastectomy with sentinel lymph node biopsy. We reviewed again:  I discussed the available options with the patient. The risk of recurrence is similar between mastectomy and lumpectomy with radiation.  I also discussed that given the size of the cancer would recommend simple mastectomy, which appears to be her preference thus avoiding postoperative radiation, well excepting a slightly longer anesthetic.  We discussed she is not a good risk for additional reconstruction.  I also discussed that we would need to do a sentinel lymph node biopsy to check the nodes.   Explained to the patient that after her surgical treatment additional treatment will depend on her prognostic indicators and stage.    I discussed risks of bleeding, infection, damage to surrounding tissues, having positive margins, needing further resection, damage to nerves causing arm numbness or difficulty raising arm, causing lymphedema in the arm; as well as anesthesia risks of MI, stroke, prolonged ventilation,  pulmonary embolism, thrombosis and even death.   Patient was given the opportunity to ask questions and have them answered.  They would like to proceed with right breast simple mastectomy with sentinel lymph node biopsy.   Face-to-face time spent with the patient and accompanying care providers(if present) was 25 minutes, with more than 50% of the time spent counseling, educating, and coordinating care of the patient.      Ronny Bacon M.D., FACS 12/19/2019, 10:29 AM

## 2019-12-19 NOTE — Telephone Encounter (Signed)
Patient returned

## 2019-12-20 ENCOUNTER — Other Ambulatory Visit
Admission: RE | Admit: 2019-12-20 | Discharge: 2019-12-20 | Disposition: A | Discharge status: Home / Self Care | Payer: BC Managed Care – PPO | Source: Ambulatory Visit | Attending: Surgery | Admitting: Surgery

## 2019-12-20 ENCOUNTER — Other Ambulatory Visit: Payer: Self-pay

## 2019-12-20 DIAGNOSIS — Z01812 Encounter for preprocedural laboratory examination: Secondary | ICD-10-CM | POA: Diagnosis not present

## 2019-12-20 DIAGNOSIS — Z20822 Contact with and (suspected) exposure to covid-19: Secondary | ICD-10-CM | POA: Insufficient documentation

## 2019-12-20 LAB — SARS CORONAVIRUS 2 (TAT 6-24 HRS): SARS Coronavirus 2: NEGATIVE

## 2019-12-22 MED ORDER — CIPROFLOXACIN IN D5W 400 MG/200ML IV SOLN: 400.0000 mg | INTRAVENOUS | Status: DC

## 2019-12-23 ENCOUNTER — Other Ambulatory Visit: Payer: Self-pay

## 2019-12-23 ENCOUNTER — Ambulatory Visit
Admission: RE | Admit: 2019-12-23 | Discharge: 2019-12-23 | Disposition: A | Discharge status: Home / Self Care | Payer: BC Managed Care – PPO | Source: Ambulatory Visit | Attending: Surgery | Admitting: Surgery

## 2019-12-23 ENCOUNTER — Ambulatory Visit: Payer: BC Managed Care – PPO | Admitting: Anesthesiology

## 2019-12-23 ENCOUNTER — Encounter
Admission: RE | Disposition: A | Discharge status: Home Health | Payer: Self-pay | Source: Home / Self Care | Attending: Internal Medicine

## 2019-12-23 ENCOUNTER — Ambulatory Visit (HOSPITAL_BASED_OUTPATIENT_CLINIC_OR_DEPARTMENT_OTHER)
Admission: RE | Admit: 2019-12-23 | Discharge: 2019-12-23 | Disposition: A | Discharge status: Home / Self Care | Payer: BC Managed Care – PPO | Source: Ambulatory Visit | Attending: Surgery | Admitting: Surgery

## 2019-12-23 ENCOUNTER — Observation Stay
Admission: RE | Admit: 2019-12-23 | Discharge: 2019-12-25 | Disposition: A | Discharge status: Home Health | Payer: BC Managed Care – PPO | Attending: Internal Medicine | Admitting: Internal Medicine

## 2019-12-23 ENCOUNTER — Encounter: Payer: Self-pay | Admitting: Surgery

## 2019-12-23 ENCOUNTER — Telehealth: Payer: Self-pay

## 2019-12-23 DIAGNOSIS — E1122 Type 2 diabetes mellitus with diabetic chronic kidney disease: Secondary | ICD-10-CM | POA: Diagnosis not present

## 2019-12-23 DIAGNOSIS — N182 Chronic kidney disease, stage 2 (mild): Secondary | ICD-10-CM | POA: Diagnosis not present

## 2019-12-23 DIAGNOSIS — Z794 Long term (current) use of insulin: Secondary | ICD-10-CM | POA: Insufficient documentation

## 2019-12-23 DIAGNOSIS — J449 Chronic obstructive pulmonary disease, unspecified: Secondary | ICD-10-CM

## 2019-12-23 DIAGNOSIS — I130 Hypertensive heart and chronic kidney disease with heart failure and stage 1 through stage 4 chronic kidney disease, or unspecified chronic kidney disease: Secondary | ICD-10-CM | POA: Diagnosis not present

## 2019-12-23 DIAGNOSIS — Z170 Estrogen receptor positive status [ER+]: Secondary | ICD-10-CM

## 2019-12-23 DIAGNOSIS — C50511 Malignant neoplasm of lower-outer quadrant of right female breast: Secondary | ICD-10-CM

## 2019-12-23 DIAGNOSIS — E1165 Type 2 diabetes mellitus with hyperglycemia: Secondary | ICD-10-CM | POA: Insufficient documentation

## 2019-12-23 DIAGNOSIS — N1832 Chronic kidney disease, stage 3b: Secondary | ICD-10-CM | POA: Diagnosis not present

## 2019-12-23 DIAGNOSIS — Z880 Allergy status to penicillin: Secondary | ICD-10-CM | POA: Insufficient documentation

## 2019-12-23 DIAGNOSIS — Z9981 Dependence on supplemental oxygen: Secondary | ICD-10-CM | POA: Insufficient documentation

## 2019-12-23 DIAGNOSIS — Z720 Tobacco use: Secondary | ICD-10-CM | POA: Diagnosis not present

## 2019-12-23 DIAGNOSIS — Z8673 Personal history of transient ischemic attack (TIA), and cerebral infarction without residual deficits: Secondary | ICD-10-CM | POA: Insufficient documentation

## 2019-12-23 DIAGNOSIS — Z79891 Long term (current) use of opiate analgesic: Secondary | ICD-10-CM | POA: Insufficient documentation

## 2019-12-23 DIAGNOSIS — Z7951 Long term (current) use of inhaled steroids: Secondary | ICD-10-CM | POA: Insufficient documentation

## 2019-12-23 DIAGNOSIS — I5033 Acute on chronic diastolic (congestive) heart failure: Secondary | ICD-10-CM | POA: Diagnosis not present

## 2019-12-23 DIAGNOSIS — I6521 Occlusion and stenosis of right carotid artery: Secondary | ICD-10-CM | POA: Insufficient documentation

## 2019-12-23 DIAGNOSIS — Z803 Family history of malignant neoplasm of breast: Secondary | ICD-10-CM | POA: Insufficient documentation

## 2019-12-23 DIAGNOSIS — E785 Hyperlipidemia, unspecified: Secondary | ICD-10-CM | POA: Diagnosis not present

## 2019-12-23 DIAGNOSIS — K219 Gastro-esophageal reflux disease without esophagitis: Secondary | ICD-10-CM | POA: Diagnosis not present

## 2019-12-23 DIAGNOSIS — Z9011 Acquired absence of right breast and nipple: Secondary | ICD-10-CM | POA: Insufficient documentation

## 2019-12-23 DIAGNOSIS — R569 Unspecified convulsions: Secondary | ICD-10-CM | POA: Diagnosis not present

## 2019-12-23 DIAGNOSIS — F319 Bipolar disorder, unspecified: Secondary | ICD-10-CM | POA: Diagnosis not present

## 2019-12-23 DIAGNOSIS — Z86718 Personal history of other venous thrombosis and embolism: Secondary | ICD-10-CM | POA: Insufficient documentation

## 2019-12-23 DIAGNOSIS — Z7982 Long term (current) use of aspirin: Secondary | ICD-10-CM | POA: Insufficient documentation

## 2019-12-23 DIAGNOSIS — F17210 Nicotine dependence, cigarettes, uncomplicated: Secondary | ICD-10-CM | POA: Insufficient documentation

## 2019-12-23 DIAGNOSIS — Z888 Allergy status to other drugs, medicaments and biological substances status: Secondary | ICD-10-CM | POA: Insufficient documentation

## 2019-12-23 DIAGNOSIS — Z66 Do not resuscitate: Secondary | ICD-10-CM | POA: Diagnosis not present

## 2019-12-23 DIAGNOSIS — C50011 Malignant neoplasm of nipple and areola, right female breast: Secondary | ICD-10-CM

## 2019-12-23 DIAGNOSIS — Z885 Allergy status to narcotic agent status: Secondary | ICD-10-CM | POA: Insufficient documentation

## 2019-12-23 DIAGNOSIS — Z833 Family history of diabetes mellitus: Secondary | ICD-10-CM | POA: Insufficient documentation

## 2019-12-23 DIAGNOSIS — N189 Chronic kidney disease, unspecified: Secondary | ICD-10-CM

## 2019-12-23 DIAGNOSIS — I5032 Chronic diastolic (congestive) heart failure: Secondary | ICD-10-CM | POA: Diagnosis not present

## 2019-12-23 DIAGNOSIS — Z8249 Family history of ischemic heart disease and other diseases of the circulatory system: Secondary | ICD-10-CM | POA: Insufficient documentation

## 2019-12-23 DIAGNOSIS — F418 Other specified anxiety disorders: Secondary | ICD-10-CM | POA: Diagnosis not present

## 2019-12-23 DIAGNOSIS — I2510 Atherosclerotic heart disease of native coronary artery without angina pectoris: Secondary | ICD-10-CM | POA: Diagnosis not present

## 2019-12-23 DIAGNOSIS — I4510 Unspecified right bundle-branch block: Secondary | ICD-10-CM | POA: Diagnosis not present

## 2019-12-23 DIAGNOSIS — Z79899 Other long term (current) drug therapy: Secondary | ICD-10-CM | POA: Insufficient documentation

## 2019-12-23 DIAGNOSIS — G4733 Obstructive sleep apnea (adult) (pediatric): Secondary | ICD-10-CM | POA: Insufficient documentation

## 2019-12-23 DIAGNOSIS — N179 Acute kidney failure, unspecified: Secondary | ICD-10-CM | POA: Insufficient documentation

## 2019-12-23 DIAGNOSIS — N183 Chronic kidney disease, stage 3 unspecified: Secondary | ICD-10-CM

## 2019-12-23 DIAGNOSIS — C50911 Malignant neoplasm of unspecified site of right female breast: Secondary | ICD-10-CM

## 2019-12-23 DIAGNOSIS — E781 Pure hyperglyceridemia: Secondary | ICD-10-CM | POA: Insufficient documentation

## 2019-12-23 DIAGNOSIS — Z6841 Body Mass Index (BMI) 40.0 and over, adult: Secondary | ICD-10-CM | POA: Insufficient documentation

## 2019-12-23 DIAGNOSIS — E1142 Type 2 diabetes mellitus with diabetic polyneuropathy: Secondary | ICD-10-CM | POA: Diagnosis not present

## 2019-12-23 HISTORY — DX: Malignant neoplasm of unspecified site of right female breast: C50.911

## 2019-12-23 HISTORY — PX: SIMPLE MASTECTOMY WITH AXILLARY SENTINEL NODE BIOPSY: SHX6098

## 2019-12-23 LAB — GLUCOSE, CAPILLARY
Glucose-Capillary: 376 mg/dL — ABNORMAL HIGH (ref 70–99)
Glucose-Capillary: 344 mg/dL — ABNORMAL HIGH (ref 70–99)
Glucose-Capillary: 325 mg/dL — ABNORMAL HIGH (ref 70–99)
Glucose-Capillary: 332 mg/dL — ABNORMAL HIGH (ref 70–99)
Glucose-Capillary: 459 mg/dL — ABNORMAL HIGH (ref 70–99)
Glucose-Capillary: 534 mg/dL (ref 70–99)

## 2019-12-23 LAB — POCT PREGNANCY, URINE: Preg Test, Ur: NEGATIVE

## 2019-12-23 SURGERY — SIMPLE MASTECTOMY WITH AXILLARY SENTINEL NODE BIOPSY
Anesthesia: General | Laterality: Right

## 2019-12-23 MED ORDER — CIPROFLOXACIN IN D5W 400 MG/200ML IV SOLN
INTRAVENOUS | Status: AC
  Filled 2019-12-23: qty 200

## 2019-12-23 MED ORDER — ISOSULFAN BLUE 1 % ~~LOC~~ SOLN
SUBCUTANEOUS | Status: DC | PRN
  Administered 2019-12-23: 5 mg via SUBCUTANEOUS

## 2019-12-23 MED ORDER — EPHEDRINE 5 MG/ML INJ
INTRAVENOUS | Status: AC
  Filled 2019-12-23: qty 10

## 2019-12-23 MED ORDER — DAPAGLIFLOZIN PRO-METFORMIN ER 5-500 MG PO TB24: 1.0000 | ORAL_TABLET | Freq: Every day | ORAL | Status: DC

## 2019-12-23 MED ORDER — CELECOXIB 200 MG PO CAPS: 200.0000 mg | ORAL_CAPSULE | ORAL | Status: AC

## 2019-12-23 MED ORDER — BUSPIRONE HCL 15 MG PO TABS
30.0000 mg | ORAL_TABLET | Freq: Two times a day (BID) | ORAL | Status: DC
  Administered 2019-12-23 – 2019-12-25 (×4): 30 mg via ORAL
  Filled 2019-12-23 (×5): qty 2

## 2019-12-23 MED ORDER — ACETAMINOPHEN 500 MG PO TABS: 1000.0000 mg | ORAL_TABLET | Freq: Four times a day (QID) | ORAL | Status: DC | PRN

## 2019-12-23 MED ORDER — DEXAMETHASONE SODIUM PHOSPHATE 10 MG/ML IJ SOLN
INTRAMUSCULAR | Status: AC
  Filled 2019-12-23: qty 1

## 2019-12-23 MED ORDER — ALBUTEROL SULFATE (2.5 MG/3ML) 0.083% IN NEBU
2.5000 mg | INHALATION_SOLUTION | RESPIRATORY_TRACT | Status: DC | PRN
  Administered 2019-12-23: 2.5 mg via RESPIRATORY_TRACT
  Filled 2019-12-23: qty 3

## 2019-12-23 MED ORDER — INSULIN ASPART 100 UNIT/ML ~~LOC~~ SOLN
SUBCUTANEOUS | Status: AC
  Administered 2019-12-23: 10 [IU]
  Filled 2019-12-23: qty 1

## 2019-12-23 MED ORDER — SODIUM CHLORIDE 0.9 % IV SOLN: INTRAVENOUS | Status: DC

## 2019-12-23 MED ORDER — PROPOFOL 10 MG/ML IV BOLUS
INTRAVENOUS | Status: DC | PRN
  Administered 2019-12-23: 160 mg via INTRAVENOUS

## 2019-12-23 MED ORDER — CITALOPRAM HYDROBROMIDE 20 MG PO TABS
20.0000 mg | ORAL_TABLET | Freq: Every day | ORAL | Status: DC
  Administered 2019-12-23 – 2019-12-25 (×3): 20 mg via ORAL
  Filled 2019-12-23 (×3): qty 1

## 2019-12-23 MED ORDER — ONDANSETRON HCL 4 MG/2ML IJ SOLN: 4.0000 mg | Freq: Once | INTRAMUSCULAR | Status: DC | PRN

## 2019-12-23 MED ORDER — FENTANYL CITRATE (PF) 100 MCG/2ML IJ SOLN
INTRAMUSCULAR | Status: AC
  Filled 2019-12-23: qty 2

## 2019-12-23 MED ORDER — IPRATROPIUM-ALBUTEROL 0.5-2.5 (3) MG/3ML IN SOLN: 3.0000 mL | RESPIRATORY_TRACT | Status: DC | PRN

## 2019-12-23 MED ORDER — ONDANSETRON HCL 4 MG/2ML IJ SOLN
INTRAMUSCULAR | Status: DC | PRN
  Administered 2019-12-23: 4 mg via INTRAVENOUS

## 2019-12-23 MED ORDER — INSULIN ASPART 100 UNIT/ML ~~LOC~~ SOLN
0.0000 [IU] | SUBCUTANEOUS | Status: DC
  Administered 2019-12-24: 20 [IU] via SUBCUTANEOUS
  Filled 2019-12-23: qty 1

## 2019-12-23 MED ORDER — ALBUTEROL SULFATE HFA 108 (90 BASE) MCG/ACT IN AERS
INHALATION_SPRAY | RESPIRATORY_TRACT | Status: DC | PRN
  Administered 2019-12-23: 3 via RESPIRATORY_TRACT

## 2019-12-23 MED ORDER — TAMOXIFEN CITRATE 10 MG PO TABS
20.0000 mg | ORAL_TABLET | Freq: Every day | ORAL | Status: DC
  Administered 2019-12-23 – 2019-12-25 (×3): 20 mg via ORAL
  Filled 2019-12-23 (×3): qty 2

## 2019-12-23 MED ORDER — ROPINIROLE HCL 1 MG PO TABS
1.0000 mg | ORAL_TABLET | Freq: Every day | ORAL | Status: DC
  Administered 2019-12-23 – 2019-12-24 (×2): 1 mg via ORAL
  Filled 2019-12-23 (×2): qty 1

## 2019-12-23 MED ORDER — METOLAZONE 5 MG PO TABS
5.0000 mg | ORAL_TABLET | Freq: Every day | ORAL | Status: DC
  Filled 2019-12-23: qty 1

## 2019-12-23 MED ORDER — ASPIRIN EC 81 MG PO TBEC
81.0000 mg | DELAYED_RELEASE_TABLET | Freq: Every day | ORAL | Status: DC
  Filled 2019-12-23: qty 1

## 2019-12-23 MED ORDER — CHLORHEXIDINE GLUCONATE 0.12 % MT SOLN: 15.0000 mL | Freq: Once | OROMUCOSAL | Status: AC

## 2019-12-23 MED ORDER — TECHNETIUM TC 99M SULFUR COLLOID FILTERED
1.0000 | Freq: Once | INTRAVENOUS | Status: AC | PRN
  Administered 2019-12-23: 0.902 via INTRADERMAL

## 2019-12-23 MED ORDER — BUPIVACAINE LIPOSOME 1.3 % IJ SUSP
INTRAMUSCULAR | Status: AC
  Filled 2019-12-23: qty 20

## 2019-12-23 MED ORDER — FENTANYL CITRATE (PF) 100 MCG/2ML IJ SOLN
25.0000 ug | INTRAMUSCULAR | Status: DC | PRN
  Administered 2019-12-23 (×4): 25 ug via INTRAVENOUS

## 2019-12-23 MED ORDER — PROPOFOL 500 MG/50ML IV EMUL
INTRAVENOUS | Status: AC
  Filled 2019-12-23: qty 50

## 2019-12-23 MED ORDER — CHLORHEXIDINE GLUCONATE CLOTH 2 % EX PADS: 6.0000 | MEDICATED_PAD | Freq: Once | CUTANEOUS | Status: DC

## 2019-12-23 MED ORDER — ACETAMINOPHEN 500 MG PO TABS
ORAL_TABLET | ORAL | Status: AC
  Administered 2019-12-23: 1000 mg via ORAL
  Filled 2019-12-23: qty 2

## 2019-12-23 MED ORDER — FENTANYL CITRATE (PF) 100 MCG/2ML IJ SOLN
INTRAMUSCULAR | Status: DC | PRN
  Administered 2019-12-23 (×2): 50 ug via INTRAVENOUS

## 2019-12-23 MED ORDER — ACETAMINOPHEN 500 MG PO TABS: 1000.0000 mg | ORAL_TABLET | ORAL | Status: AC

## 2019-12-23 MED ORDER — BUPIVACAINE LIPOSOME 1.3 % IJ SUSP: 20.0000 mL | Freq: Once | INTRAMUSCULAR | Status: DC

## 2019-12-23 MED ORDER — ONDANSETRON HCL 4 MG/2ML IJ SOLN
INTRAMUSCULAR | Status: AC
  Filled 2019-12-23: qty 2

## 2019-12-23 MED ORDER — DOXYCYCLINE HYCLATE 100 MG PO TABS
100.0000 mg | ORAL_TABLET | Freq: Two times a day (BID) | ORAL | Status: DC
  Administered 2019-12-23 – 2019-12-25 (×5): 100 mg via ORAL
  Filled 2019-12-23 (×5): qty 1

## 2019-12-23 MED ORDER — "INSULIN SYRINGE 30G X 1/2"" 1 ML MISC": 1.0000 | Freq: Two times a day (BID) | Status: DC

## 2019-12-23 MED ORDER — INSULIN ASPART 100 UNIT/ML ~~LOC~~ SOLN
20.0000 [IU] | Freq: Once | SUBCUTANEOUS | Status: AC
  Administered 2019-12-23: 20 [IU] via SUBCUTANEOUS
  Filled 2019-12-23: qty 1

## 2019-12-23 MED ORDER — SUCCINYLCHOLINE CHLORIDE 200 MG/10ML IV SOSY
PREFILLED_SYRINGE | INTRAVENOUS | Status: AC
  Filled 2019-12-23: qty 10

## 2019-12-23 MED ORDER — INSULIN ASPART 100 UNIT/ML ~~LOC~~ SOLN: 10.0000 [IU] | Freq: Once | SUBCUTANEOUS | Status: AC

## 2019-12-23 MED ORDER — PHENYLEPHRINE HCL (PRESSORS) 10 MG/ML IV SOLN
INTRAVENOUS | Status: DC | PRN
  Administered 2019-12-23 (×3): 100 ug via INTRAVENOUS

## 2019-12-23 MED ORDER — GLUCOSE BLOOD VI STRP: 1.0000 | ORAL_STRIP | Freq: Every day | Status: DC

## 2019-12-23 MED ORDER — TORSEMIDE 20 MG PO TABS
20.0000 mg | ORAL_TABLET | Freq: Two times a day (BID) | ORAL | Status: DC
  Administered 2019-12-24: 20 mg via ORAL
  Filled 2019-12-23 (×3): qty 1

## 2019-12-23 MED ORDER — PREGABALIN 75 MG PO CAPS
300.0000 mg | ORAL_CAPSULE | Freq: Two times a day (BID) | ORAL | Status: DC
  Administered 2019-12-23 – 2019-12-25 (×5): 300 mg via ORAL
  Filled 2019-12-23 (×5): qty 4

## 2019-12-23 MED ORDER — LIDOCAINE HCL 4 % EX SOLN
CUTANEOUS | Status: DC | PRN
Start: 2019-12-23 — End: 2019-12-23
  Administered 2019-12-23: 5 mL via TOPICAL

## 2019-12-23 MED ORDER — NICOTINE 21 MG/24HR TD PT24
21.0000 mg | MEDICATED_PATCH | Freq: Every day | TRANSDERMAL | Status: DC
  Administered 2019-12-23 – 2019-12-25 (×3): 21 mg via TRANSDERMAL
  Filled 2019-12-23 (×2): qty 1

## 2019-12-23 MED ORDER — ALPRAZOLAM 0.5 MG PO TABS: 1.0000 mg | ORAL_TABLET | Freq: Four times a day (QID) | ORAL | Status: DC | PRN

## 2019-12-23 MED ORDER — ONETOUCH DELICA LANCETS 33G MISC: 1.0000 | Freq: Three times a day (TID) | Status: DC

## 2019-12-23 MED ORDER — ORAL CARE MOUTH RINSE: 15.0000 mL | Freq: Once | OROMUCOSAL | Status: AC

## 2019-12-23 MED ORDER — DEXAMETHASONE SODIUM PHOSPHATE 10 MG/ML IJ SOLN
INTRAMUSCULAR | Status: DC | PRN
  Administered 2019-12-23: 5 mg via INTRAVENOUS

## 2019-12-23 MED ORDER — ISOSULFAN BLUE 1 % ~~LOC~~ SOLN
SUBCUTANEOUS | Status: AC
  Filled 2019-12-23: qty 5

## 2019-12-23 MED ORDER — SODIUM CHLORIDE 0.45 % IV SOLN: INTRAVENOUS | Status: DC

## 2019-12-23 MED ORDER — SPIRONOLACTONE 25 MG PO TABS
50.0000 mg | ORAL_TABLET | Freq: Every day | ORAL | Status: DC
  Administered 2019-12-24: 50 mg via ORAL
  Filled 2019-12-23: qty 2

## 2019-12-23 MED ORDER — CHLORHEXIDINE GLUCONATE 0.12 % MT SOLN
OROMUCOSAL | Status: AC
  Administered 2019-12-23: 15 mL via OROMUCOSAL
  Filled 2019-12-23: qty 15

## 2019-12-23 MED ORDER — MOMETASONE FURO-FORMOTEROL FUM 100-5 MCG/ACT IN AERO
2.0000 | INHALATION_SPRAY | Freq: Two times a day (BID) | RESPIRATORY_TRACT | Status: DC
  Administered 2019-12-23 – 2019-12-25 (×4): 2 via RESPIRATORY_TRACT
  Filled 2019-12-23: qty 8.8

## 2019-12-23 MED ORDER — ROCURONIUM BROMIDE 10 MG/ML (PF) SYRINGE
PREFILLED_SYRINGE | INTRAVENOUS | Status: AC
  Filled 2019-12-23: qty 10

## 2019-12-23 MED ORDER — NICOTINE 21 MG/24HR TD PT24
21.0000 mg | MEDICATED_PATCH | Freq: Every day | TRANSDERMAL | Status: DC | PRN
  Filled 2019-12-23: qty 1

## 2019-12-23 MED ORDER — ONDANSETRON HCL 4 MG PO TABS: 4.0000 mg | ORAL_TABLET | Freq: Every day | ORAL | Status: DC | PRN

## 2019-12-23 MED ORDER — FAMOTIDINE 20 MG PO TABS
20.0000 mg | ORAL_TABLET | Freq: Two times a day (BID) | ORAL | Status: DC
  Administered 2019-12-23 – 2019-12-25 (×5): 20 mg via ORAL
  Filled 2019-12-23 (×5): qty 1

## 2019-12-23 MED ORDER — INSULIN ASPART 100 UNIT/ML ~~LOC~~ SOLN
SUBCUTANEOUS | Status: DC | PRN
  Administered 2019-12-23: 10 [IU] via SUBCUTANEOUS

## 2019-12-23 MED ORDER — CIPROFLOXACIN IN D5W 400 MG/200ML IV SOLN
400.0000 mg | Freq: Two times a day (BID) | INTRAVENOUS | Status: AC
  Administered 2019-12-23: 400 mg via INTRAVENOUS
  Filled 2019-12-23: qty 200

## 2019-12-23 MED ORDER — DEXMEDETOMIDINE HCL IN NACL 80 MCG/20ML IV SOLN
INTRAVENOUS | Status: AC
  Filled 2019-12-23: qty 20

## 2019-12-23 MED ORDER — DEXMEDETOMIDINE HCL 200 MCG/2ML IV SOLN
INTRAVENOUS | Status: DC | PRN
  Administered 2019-12-23: 8 ug via INTRAVENOUS

## 2019-12-23 MED ORDER — LIDOCAINE HCL (CARDIAC) PF 100 MG/5ML IV SOSY
PREFILLED_SYRINGE | INTRAVENOUS | Status: DC | PRN
  Administered 2019-12-23: 100 mg via INTRAVENOUS

## 2019-12-23 MED ORDER — ROSUVASTATIN CALCIUM 10 MG PO TABS
10.0000 mg | ORAL_TABLET | Freq: Every day | ORAL | Status: DC
  Administered 2019-12-23 – 2019-12-24 (×2): 10 mg via ORAL
  Filled 2019-12-23 (×2): qty 1

## 2019-12-23 MED ORDER — TRAZODONE HCL 100 MG PO TABS
200.0000 mg | ORAL_TABLET | Freq: Every day | ORAL | Status: DC
  Administered 2019-12-23 – 2019-12-24 (×2): 200 mg via ORAL
  Filled 2019-12-23 (×2): qty 2

## 2019-12-23 MED ORDER — INSULIN REGULAR HUMAN (CONC) 500 UNIT/ML ~~LOC~~ SOPN
30.0000 [IU] | PEN_INJECTOR | Freq: Two times a day (BID) | SUBCUTANEOUS | Status: DC
  Administered 2019-12-23: 50 [IU] via SUBCUTANEOUS
  Filled 2019-12-23: qty 3

## 2019-12-23 MED ORDER — CELECOXIB 200 MG PO CAPS
ORAL_CAPSULE | ORAL | Status: AC
  Administered 2019-12-23: 200 mg via ORAL
  Filled 2019-12-23: qty 1

## 2019-12-23 MED ORDER — INSULIN ASPART 100 UNIT/ML ~~LOC~~ SOLN
SUBCUTANEOUS | Status: AC
  Filled 2019-12-23: qty 1

## 2019-12-23 MED ORDER — ROCURONIUM BROMIDE 100 MG/10ML IV SOLN
INTRAVENOUS | Status: DC | PRN
  Administered 2019-12-23: 10 mg via INTRAVENOUS

## 2019-12-23 MED ORDER — MIDAZOLAM HCL 2 MG/2ML IJ SOLN
INTRAMUSCULAR | Status: DC | PRN
  Administered 2019-12-23: 2 mg via INTRAVENOUS

## 2019-12-23 MED ORDER — ENOXAPARIN SODIUM 40 MG/0.4ML ~~LOC~~ SOLN
40.0000 mg | SUBCUTANEOUS | Status: DC
  Filled 2019-12-23: qty 0.4

## 2019-12-23 MED ORDER — FLUOXETINE HCL 20 MG PO CAPS
20.0000 mg | ORAL_CAPSULE | Freq: Every day | ORAL | Status: DC
  Administered 2019-12-23 – 2019-12-25 (×3): 20 mg via ORAL
  Filled 2019-12-23 (×3): qty 1

## 2019-12-23 MED ORDER — MIDAZOLAM HCL 2 MG/2ML IJ SOLN
INTRAMUSCULAR | Status: AC
  Filled 2019-12-23: qty 2

## 2019-12-23 MED ORDER — BUPIVACAINE HCL (PF) 0.25 % IJ SOLN
INTRAMUSCULAR | Status: AC
  Filled 2019-12-23: qty 30

## 2019-12-23 MED ORDER — OXYCODONE HCL 5 MG PO TABS
5.0000 mg | ORAL_TABLET | ORAL | Status: DC | PRN
  Administered 2019-12-23 – 2019-12-25 (×10): 10 mg via ORAL
  Filled 2019-12-23 (×10): qty 2

## 2019-12-23 MED ORDER — SUCCINYLCHOLINE CHLORIDE 20 MG/ML IJ SOLN
INTRAMUSCULAR | Status: DC | PRN
  Administered 2019-12-23: 120 mg via INTRAVENOUS

## 2019-12-23 MED ORDER — ALBUTEROL SULFATE HFA 108 (90 BASE) MCG/ACT IN AERS: 2.0000 | INHALATION_SPRAY | Freq: Four times a day (QID) | RESPIRATORY_TRACT | Status: DC | PRN

## 2019-12-23 SURGICAL SUPPLY — 44 items
BINDER BREAST LRG (GAUZE/BANDAGES/DRESSINGS) IMPLANT
BINDER BREAST MEDIUM (GAUZE/BANDAGES/DRESSINGS) IMPLANT
BINDER BREAST XLRG (GAUZE/BANDAGES/DRESSINGS) IMPLANT
BINDER BREAST XXLRG (GAUZE/BANDAGES/DRESSINGS) ×2 IMPLANT
BLADE SURG 15 STRL LF DISP TIS (BLADE) ×1 IMPLANT
BLADE SURG 15 STRL SS (BLADE) ×2
BULB RESERV EVAC DRAIN JP 100C (MISCELLANEOUS) ×2 IMPLANT
CANISTER SUCT 1200ML W/VALVE (MISCELLANEOUS) ×2 IMPLANT
CHLORAPREP W/TINT 26 (MISCELLANEOUS) ×2 IMPLANT
CLIP VESOCCLUDE MED 6/CT (CLIP) IMPLANT
DRAIN CHANNEL JP 15F RND 16 (MISCELLANEOUS) ×2 IMPLANT
DRAPE LAPAROTOMY TRNSV 106X77 (MISCELLANEOUS) ×2 IMPLANT
DRSG GAUZE FLUFF 36X18 (GAUZE/BANDAGES/DRESSINGS) ×2 IMPLANT
DRSG TELFA 3X8 NADH (GAUZE/BANDAGES/DRESSINGS) ×2 IMPLANT
ELECT CAUTERY BLADE TIP 2.5 (TIP) ×2
ELECT REM PT RETURN 9FT ADLT (ELECTROSURGICAL) ×2
ELECTRODE CAUTERY BLDE TIP 2.5 (TIP) ×1 IMPLANT
ELECTRODE REM PT RTRN 9FT ADLT (ELECTROSURGICAL) ×1 IMPLANT
GLOVE ORTHO TXT STRL SZ7.5 (GLOVE) ×14 IMPLANT
GOWN STRL REUS W/ TWL LRG LVL3 (GOWN DISPOSABLE) ×4 IMPLANT
GOWN STRL REUS W/TWL LRG LVL3 (GOWN DISPOSABLE) ×8
HEMOSTAT ARISTA ABSORB 3G PWDR (HEMOSTASIS) IMPLANT
KIT TURNOVER KIT A (KITS) ×2 IMPLANT
NEEDLE HYPO 22GX1.5 SAFETY (NEEDLE) ×2 IMPLANT
PACK BASIN MAJOR (MISCELLANEOUS) ×2 IMPLANT
SHEARS HARMONIC 9CM CVD (BLADE) IMPLANT
SLEVE PROBE SENORX GAMMA FIND (MISCELLANEOUS) ×2 IMPLANT
SPONGE LAP 18X18 RF (DISPOSABLE) ×2 IMPLANT
STAPLER SKIN PROX 35W (STAPLE) IMPLANT
SUT ETHILON 3-0 FS-10 30 BLK (SUTURE) ×2
SUT MNCRL AB 4-0 PS2 18 (SUTURE) ×4 IMPLANT
SUT SILK 2 0 (SUTURE) ×2
SUT SILK 2 0 SH (SUTURE) ×2 IMPLANT
SUT SILK 2-0 18XBRD TIE 12 (SUTURE) ×1 IMPLANT
SUT VIC AB 3-0 54X BRD REEL (SUTURE) ×1 IMPLANT
SUT VIC AB 3-0 BRD 54 (SUTURE) ×2
SUT VIC AB 3-0 SH 27 (SUTURE) ×4
SUT VIC AB 3-0 SH 27X BRD (SUTURE) ×2 IMPLANT
SUTURE EHLN 3-0 FS-10 30 BLK (SUTURE) ×1 IMPLANT
SWABSTK COMLB BENZOIN TINCTURE (MISCELLANEOUS) IMPLANT
SYR 10ML LL (SYRINGE) IMPLANT
SYR 20ML LL LF (SYRINGE) ×2 IMPLANT
SYR BULB IRRIG 60ML STRL (SYRINGE) ×2 IMPLANT
WATER STERILE IRR 1000ML POUR (IV SOLUTION) ×2 IMPLANT

## 2019-12-23 NOTE — Progress Notes (Addendum)
Inpatient Diabetes Program Recommendations  AACE/ADA: New Consensus Statement on Inpatient Glycemic Control  Target Ranges:  Prepandial:   less than 140 mg/dL      Peak postprandial:   less than 180 mg/dL (1-2 hours)      Critically ill patients:  140 - 180 mg/dL   Results for ADRAINE, BIFFLE (MRN [id number]) as of 12/23/2019 13:18  Ref. Range 12/23/2019 08:25 12/23/2019 09:03 12/23/2019 10:24 12/23/2019 11:34  Glucose-Capillary Latest Ref Range: 70 - 99 mg/dL 376 (H) 344 (H) 325 (H) 332 (H)  Results for ARACELI, ARANGO (MRN [id number]) as of 12/23/2019 13:18  Ref. Range 11/18/2019 08:10  Hemoglobin A1C Latest Ref Range: 4.8 - 5.6 % 11.8 (H)   Review of Glycemic Control  Diabetes history: DM2 Outpatient Diabetes medications: Humulin  R U500 0-80 units BID (if CBG in 200's or less takes none, if CBG in 300's takes 30 units, if CBG over 500 mg/dl takes 80 units), Xigduo XR 5-500 mg daily Current orders for Inpatient glycemic control:  Humulin R U500 30 units BID  If BG 400-499, add another 20 units;  If BG greater than 500, add another 50 units   Xigduo XR 5-500 mg QAM  Inpatient Diabetes Program Recommendations:    Oral DM medication: Merleen Nicely is not on formulary. Please discontinue while inpatient.  Insulin-Correction: Please order CBGs AC&HS with Novolog 0-15 units TID with meals and Novolog 0-5 units QHS.  A1C: A1C 11.8% on 11/18/19 indicating an average glucose of 292 mg/dl over the past 2-3 months.  NOTE: Spoke with patient over the phone regarding DM and A1C. Patient states that she is no longer seeing Dr. Loanne Drilling for DM management. She began seeing Dr. Honor Junes on 12/03/19 (initial consult). She states that when she seen Dr. Honor Junes, he had her start on Xigduo XR 5-500 mg daily.  Patient reports that she takes Humulin R U500 insulin as well and dose depends on glucose. Patient states that if glucose is in 200's or less then she does not take any insulin, if glucose in 300's she takes 30  units of Humulin R U500, if glucose is over 500 mg/dl she takes 80 units of Humulin R U500. Patient reports that she checks glucose 3 times a day and her glucose is usually in 200-300's mg/dl. Patient reports she feels symptoms of hypoglycemia if her glucose is in the 100's mg/dl. Patient reports that glucose has improved since she started the Xigduo a couple of weeks ago. Discussed A1C of 11.8% on 11/18/19 and explained that A1C indicates an average glucose of 292 mg/dl. Discussed glucose and A1C goals. Also discussed how Humulin R U500 insulin works. Explained that when she skips a dose of Humulin R U500, then glucose is likely increasing and even higher when checking glucose prior to when next dose of Humulin R U500 insulin should be given. Also discussed that her body is likely use to her glucose running high so when her glucose gets down to more normal values, she feels symptoms of hypoglycemia. Explained that if her glucose is brought down gradually toward more normal glucose targets, her body could get use to normal glucose values so she would not feel symptomatic.  Explained that she needs to keep a very detailed record of glucose and how much insulin she takes so Dr. Honor Junes can use the information to help patient continue to adjust DM medications to get DM under better control. Discussed importance of glycemic control especially following surgery to decrease risk of complications (delayed  wound healing, infection).  Encouraged patient to reach out to Dr. Honor Junes if glucose remains elevated at home so he can advise her on what changes to make. Patient verbalized understanding of information discussed and states that she has no questions at this time. Spoke with Dr. Leslye Peer regarding glycemic control. Spoke with Danae Chen, RN regarding specific administration instructions in the current order for Humulin R U500.   Thanks, Barnie Alderman, RN, MSN, CDE Diabetes Coordinator Inpatient Diabetes  Program [phone] (Team Pager from 8am to 5pm)

## 2019-12-23 NOTE — H&P (Signed)
[facility] NAME: [name]    MR#:  [id number]  DATE OF BIRTH:  [birth year]  DATE OF ADMISSION:  12/23/2019  PRIMARY CARE PHYSICIAN: Pleas Koch, NP   REQUESTING/REFERRING PHYSICIAN: Dr. Ronny Bacon.  CHIEF COMPLAINT:  Consult for diabetes management  HISTORY OF PRESENT ILLNESS:  [name]  is a 49 y.o. female with a known history of diabetes on U500 insulin.  Patient seen in the PACU after undergoing right mastectomy by Dr. Christian Mate.  Patient complains of 20 out of 10 pain in her right chest.  No complaints of shortness of breath.  She chronically wears 2.5 L of oxygen.  No complaints of shortness of breath.  She is a smoker 1 pack/day.  Clarifying what she does take for her diabetes she is takes 30 units twice a day for her sugars being in the 300s and 80 units twice a day if her sugars are greater than 500.  She does not take her insulin if her sugars are in the 200s because she does not like the way she feels if her sugars are in the 100s.  PAST MEDICAL HISTORY:   Past Medical History:  Diagnosis Date  . Anxiety    takes Prozac daily  . Anxiety   . Aortic valve calcification   . Asthma    Advair and Spirva daily  . Asthma   . Bipolar disorder ([place])   . CAD (coronary artery disease)    a. LHC 11/2013 done for CP/fluid retention: mild disease in prox LAD, mild-mod disease in mRCA, EF 60% with normal LVEDP. b. Normal nuc 03/2016.  Marland Kitchen CHF (congestive heart failure) ([place])   . Chronic diastolic CHF (congestive heart failure) ([place])   . Chronic heart failure with preserved ejection fraction ([place]) 11/16/2018  . CKD (chronic kidney disease), stage II   . COPD (chronic obstructive pulmonary disease) (HCC)    a. nocturnal O2.  Marland Kitchen COPD (chronic obstructive pulmonary disease) ([place])   . Coronary artery disease   . Decreased urine stream   . Diabetes mellitus   . Diabetes mellitus without complication ([place])   . Dyspnea    . Family history of adverse reaction to anesthesia    mom gets nauseated  . GERD (gastroesophageal reflux disease)    takes Pepcid daily  . History of blood clots    left leg 3-79yrs ago  . Hyperlipidemia   . Hypertension   . Hypertriglyceridemia   . Inguinal hernia, left 01/2015  . Muscle spasm   . Open wound of genital labia   . Peripheral neuropathy   . RBBB   . Seizures ([place])   . Sepsis ([place]) 01/19/2018  . Sinus tachycardia    a. persistent since 2009.  Marland Kitchen Smokers' cough ([place])   . Stroke [facility]) 1989   left sided weakness  . TIA (transient ischemic attack)   . Tobacco abuse   . Vulvar abscess 01/23/2018    PAST SURGICAL HISTOIRY:   Past Surgical History:  Procedure Laterality Date  . BREAST BIOPSY Right 11/06/2019   [place] core path pending venus clip  . HERNIA REPAIR    . INCISION AND DRAINAGE ABSCESS N/A 01/26/2018   Procedure: INCISION AND DEBRIDEMENT OF VULVAR NECROTIZING SOFT TISSUE INFECTION;  Surgeon: Greer Pickerel, MD;  Location: [facility] [facility];  Service: General;  Laterality: N/A;  . INCISION AND DRAINAGE PERIRECTAL ABSCESS N/A 01/22/2018   Procedure: IRRIGATION AND DEBRIDEMENT LABIAL/VULVAR AREA;  Surgeon: Ninfa Linden,  Nathaneil Canary, MD;  Location: [place];  Service: General;  Laterality: N/A;  . INCISION AND DRAINAGE PERIRECTAL ABSCESS N/A 01/29/2018   Procedure: IRRIGATION AND DEBRIDEMENT VULVA;  Surgeon: Excell Seltzer, MD;  Location: [place];  Service: General;  Laterality: N/A;  . INGUINAL HERNIA REPAIR Left 04/08/2015   Procedure: OPEN LEFT INGUINAL HERNIA REPAIR WITH MESH;  Surgeon: Ralene Ok, MD;  Location: [place];  Service: General;  Laterality: Left;  . INSERTION OF MESH Left 04/08/2015   Procedure: INSERTION OF MESH;  Surgeon: Ralene Ok, MD;  Location: [place];  Service: General;  Laterality: Left;  . LAPAROSCOPY     Endometriosis  . LEFT HEART CATHETERIZATION WITH CORONARY ANGIOGRAM N/A 12/05/2013   Procedure: LEFT HEART CATHETERIZATION WITH CORONARY ANGIOGRAM;   Surgeon: Jettie Booze, MD;  Location: [facility] CATH LAB;  Service: Cardiovascular;  Laterality: N/A;  . right kidney drained    . TEE WITHOUT CARDIOVERSION N/A 11/28/2013   Procedure: TRANSESOPHAGEAL ECHOCARDIOGRAM (TEE);  Surgeon: Thayer Headings, MD;  Location: [facility] ENDOSCOPY;  Service: Cardiovascular;  Laterality: N/A;    SOCIAL HISTORY:   Social History   Tobacco Use  . Smoking status: Current Every Day Smoker    Packs/day: 1.00    Years: 36.00    Pack years: 36.00    Types: Cigarettes    Start date: 07/11/1981  . Smokeless tobacco: Never Used  . Tobacco comment: 1 ppd now  Substance Use Topics  . Alcohol use: No    FAMILY HISTORY:   Family History  Problem Relation Age of Onset  . Venous thrombosis Brother   . Other Brother        BRAIN TUMOR  . Asthma Father   . Diabetes Father   . Coronary artery disease Mother   . Hypertension Mother   . Diabetes Mother   . Breast cancer Mother 53  . Asthma Sister   . Diabetes type II Brother     DRUG ALLERGIES:   Allergies  Allergen Reactions  . Adhesive [Tape] Rash and Other (See Comments)    TAKES OFF THE SKIN (CERTAIN MEDICAL TAPES DO THIS!!)  . Metoprolol Shortness Of Breath    Occurrence of shortness of breath after 3 days  . Montelukast Shortness Of Breath  . Morphine Sulfate Anaphylaxis, Shortness Of Breath and Nausea And Vomiting    Swollen Throat - Able to tolerate dilaudid  . Penicillins Anaphylaxis, Hives and Shortness Of Breath    Throat swells Has patient had a PCN reaction causing immediate rash, facial/tongue/throat swelling, SOB or lightheadedness with hypotension: Yes Has patient had a PCN reaction causing severe rash involving mucus membranes or skin necrosis: No Has patient had a PCN reaction that required hospitalization: Yes Has patient had a PCN reaction occurring within the last 10 years: No If all of the above answers are "NO", then may proceed with Cephalosporin use.   . Prednisone Anaphylaxis   . Diltiazem Swelling  . Gabapentin Swelling  . Midodrine     Lightheaded and falling down    REVIEW OF SYSTEMS:  CONSTITUTIONAL: No fever, chills or sweats.  Positive for weakness.  EYES: Some blurred vision EARS, NOSE, AND THROAT: No tinnitus or ear pain.  RESPIRATORY: No cough, shortness of breath, wheezing or hemoptysis.  CARDIOVASCULAR: Right-sided postoperative chest pain.  GASTROINTESTINAL: No nausea, vomiting, diarrhea or abdominal pain.  GENITOURINARY: No dysuria, hematuria.  ENDOCRINE: No polyuria, nocturia,  HEMATOLOGY: No anemia, easy bruising or bleeding SKIN: No rash or lesion. MUSCULOSKELETAL: No  joint pain or arthritis.   NEUROLOGIC: No tingling, numbness, weakness.  PSYCHIATRY: History of anxiety and depression.   MEDICATIONS AT HOME:   Prior to Admission medications   Medication Sig Start Date End Date Taking? Authorizing Provider  acetaminophen (TYLENOL) 500 MG tablet Take 1,000 mg by mouth every 6 (six) hours as needed for moderate pain.    Yes [provider]  albuterol (VENTOLIN HFA) 108 (90 Base) MCG/ACT inhaler Inhale 2 puffs into the lungs every 6 (six) hours as needed for wheezing or shortness of breath.   Yes [provider]  ALPRAZolam Duanne Moron) 1 MG tablet Take 1 mg by mouth 4 (four) times daily as needed for anxiety.  12/02/19  Yes [provider]  aspirin EC 81 MG tablet Take 81 mg by mouth daily before breakfast.    Yes [provider]  budesonide-formoterol (SYMBICORT) 80-4.5 MCG/ACT inhaler INHALE 2 PUFFS BY MOUTH EVERY 12 HOURS TO PREVENT COUGH OR WHEEZING *RINSE MOUTH AFTER EACH USE* Patient taking differently: Inhale 2 puffs into the lungs in the morning and at bedtime.  10/08/19  Yes Lauraine Rinne, NP  busPIRone (BUSPAR) 30 MG tablet Take 30 mg by mouth 2 (two) times daily.   Yes [provider]  citalopram (CELEXA) 20 MG tablet Take 1 tablet (20 mg total) by mouth daily. 12/17/19  Yes Cammie Sickle,  MD  Dapagliflozin-metFORMIN HCl ER 5-500 MG TB24 Take 1 tablet by mouth daily with breakfast. 12/03/19 12/02/20 Yes [provider]  doxycycline (ADOXA) 100 MG tablet Take 100 mg by mouth 2 (two) times daily. 12/03/19  Yes [provider]  famotidine (PEPCID) 20 MG tablet TAKE 1 TABLET (20 MG TOTAL) BY MOUTH 2 (TWO) TIMES DAILY. FOR HEARTBURN. Patient taking differently: Take 20 mg by mouth 2 (two) times daily.  06/05/19  Yes Pleas Koch, NP  FLUoxetine (PROZAC) 20 MG tablet Take 20 mg by mouth daily.   Yes [provider]  glucose blood (ONETOUCH VERIO) test strip 1 each by Other route daily. E11.9; E10.42 12/06/19  Yes Renato Shin, MD  Insulin Pen Needle 32G X 4 MM MISC Used to give insulin injections twice daily. 08/23/17  Yes Renato Shin, MD  insulin regular human CONCENTRATED (HUMULIN R) 500 UNIT/ML injection 480 units with breakfast, and 450 units with supper Patient taking differently: Inject 200-400 Units into the skin See admin instructions. Inject 200 to 400 units twice daily with breakfast and supper 12/06/19  Yes Renato Shin, MD  Insulin Syringe-Needle U-100 (INSULIN SYRINGE 1CC/30GX1/2") 30G X 1/2" 1 ML MISC 1 each by Does not apply route 2 (two) times daily. E11.9 12/06/19  Yes Renato Shin, MD  ipratropium-albuterol (DUONEB) 0.5-2.5 (3) MG/3ML SOLN Take 3 mLs by nebulization every 4 (four) hours as needed (wheezing/shortness of breath). 08/02/17  Yes Pleas Koch, NP  metolazone (ZAROXOLYN) 5 MG tablet Take 5 mg by mouth daily.  11/11/19 11/10/20 Yes [provider]  nicotine (NICODERM CQ - DOSED IN MG/24 HOURS) 21 mg/24hr patch Place 1 patch (21 mg total) onto the skin daily. 12/17/19  Yes Cammie Sickle, MD  ondansetron (ZOFRAN) 4 MG tablet Take 4 mg by mouth daily as needed for nausea.    Yes [provider]  OneTouch Delica Lancets 40N MISC 1 each by Does not apply route 3 (three) times daily. Use to monitor glucose levels  three times daily; E11.9; E10.42 09/26/18  Yes Renato Shin, MD  OXYGEN Inhale 2-4 L into the  lungs at bedtime.    Yes [provider]  pregabalin (LYRICA) 300 MG capsule Take 1 capsule (300 mg total) by mouth 2 (two) times daily. For neuropathy. 09/24/19  Yes Pleas Koch, NP  rOPINIRole (REQUIP) 1 MG tablet TAKE 1 TABLET BY MOUTH EVERY NIGHT AT BEDTIME FOR RESTLESS LEGS. Patient taking differently: Take 1 mg by mouth at bedtime.  09/24/19  Yes Pleas Koch, NP  rosuvastatin (CRESTOR) 10 MG tablet Take 1 tablet (10 mg total) by mouth daily. Patient taking differently: Take 10 mg by mouth at bedtime.  08/16/19 12/19/19 Yes Patwardhan, Manish J, MD  spironolactone (ALDACTONE) 50 MG tablet TAKE 1 TABLET BY MOUTH ONCE A DAY Patient taking differently: Take 50 mg by mouth daily.  10/03/19  Yes Patwardhan, Manish J, MD  tamoxifen (NOLVADEX) 20 MG tablet Take 1 tablet (20 mg total) by mouth daily. 12/17/19  Yes Cammie Sickle, MD  torsemide (DEMADEX) 20 MG tablet TAKE 1 TABLET BY MOUTH 4 TIMES DAILY Patient taking differently: Take 20 mg by mouth 2 (two) times daily.  07/10/19  Yes Patwardhan, Manish J, MD  traZODone (DESYREL) 100 MG tablet Take 200 mg by mouth at bedtime.  11/08/18  Yes [provider]  ULTICARE MINI PEN NEEDLES 31G X 6 MM MISC USE AS DIRECTED THREE TIMES DAILY 07/25/18  Yes Renato Shin, MD  potassium chloride 20 MEQ TBCR Take 20 mEq by mouth daily. Patient not taking: Reported on 12/19/2019 07/09/19   Nigel Mormon, MD      VITAL SIGNS:  Blood pressure (!) 148/81, pulse 80, temperature (!) 97 F (36.1 C), resp. rate 15, height 5\' 4"  (1.626 m), weight 127.9 kg, SpO2 92 %.  PHYSICAL EXAMINATION:  GENERAL:  49 y.o.-year-old patient lying in the bed with no acute distress.  EYES: Pupils equal, round, reactive to light and accommodation. No scleral icterus. Extraocular muscles intact.  HEENT: Head atraumatic, normocephalic. Oropharynx and  nasopharynx clear.  NECK:  Supple, no jugular venous distention. No thyroid enlargement, no tenderness.  LUNGS: Normal breath sounds bilaterally, no wheezing, rales,rhonchi or crepitation. No use of accessory muscles of respiration.  CARDIOVASCULAR: S1, S2 normal. No murmurs, rubs, or gallops.  ABDOMEN: Soft, nontender, nondistended. Bowel sounds present. No organomegaly or mass.  EXTREMITIES: No pedal edema, cyanosis, or clubbing.  NEUROLOGIC: Cranial nerves II through XII are intact. Muscle strength 5/5 in all extremities. Sensation intact. Gait not checked.  PSYCHIATRIC: The patient is alert and oriented x 3.  SKIN: No obvious rash, lesion, or ulcer.   LABORATORY PANEL:   Labs on 12/16/2019 showed creatinine of 1.93  RADIOLOGY:  NM SENTINEL NODE INJECTION  Result Date: 12/23/2019 CLINICAL DATA:  Right breast cancer. EXAM: NUCLEAR MEDICINE BREAST LYMPHOSCINTIGRAPHY TECHNIQUE: Intradermal injection of radiopharmaceutical was performed at the 12 o'clock, 3 o'clock, 6 o'clock, and 9 o'clock positions around the right nipple. The patient was then sent to the operating room where the sentinel node(s) were identified and removed by the surgeon. RADIOPHARMACEUTICALS:  Total of 1 mCi Millipore-filtered Technetium-45m sulfur colloid, injected in four aliquots of 0.25 mCi each. IMPRESSION: Uncomplicated intradermal injection of a total of 1 mCi Technetium-52m sulfur colloid for purposes of sentinel node identification. Electronically Signed   By: Sandi Mariscal M.D.   On: 12/23/2019 09:13    EKG:   On 12/17/2019 normal sinus rhythm 79 bpm, left axis deviation, incomplete right bundle branch block.  IMPRESSION AND PLAN:   1.  Type 2 diabetes mellitus on U500  insulin with chronic kidney disease stage IIIb.  She normally takes 30 units if her sugars are in the 300s and up to 80 units if her sugars are in the 500s.  Case discussed with pharmacist I will order 30 units twice daily and add insulin if her sugars  are in the 400s or 500s.  Last hemoglobin A1c elevated at 11.8. 2.  End-stage COPD on chronic 2.5 L of oxygen.  Continue usual inhalers 3.  Diabetic neuropathy on Lyrica 4.  Chronic diastolic congestive heart failure.  Continue to monitor.  Currently no signs of congestive heart failure. 5.  Breast cancer status post right mastectomy 6.  Tobacco abuse.  Nicotine patch 7.  Anxiety depression and bipolar disorder continue psychiatric medications 8.  Hyperlipidemia unspecified on Crestor  All the records are reviewed and case discussed with Consulting provider. Management plans discussed with the patient, and she has in agreement.   TOTAL TIME TAKING CARE OF THIS PATIENT: 50 minutes.    Loletha Grayer M.D on 12/23/2019 at 12:23 PM  Between 7am to 6pm - Pager - [phone]  After 6pm go to [email or web address] - password [email or web address]  [facility]  CC: Primary care Physician: Pleas Koch, NP

## 2019-12-23 NOTE — Anesthesia Procedure Notes (Signed)
Procedure Name: Intubation Date/Time: 12/23/2019 9:27 AM Performed by: , , CRNA Pre-anesthesia Checklist: Patient identified, Patient being monitored, Timeout performed, Emergency Drugs available and Suction available Patient Re-evaluated:Patient Re-evaluated prior to induction Oxygen Delivery Method: Circle system utilized Preoxygenation: Pre-oxygenation with 100% oxygen Induction Type: IV induction Ventilation: Mask ventilation without difficulty Laryngoscope Size: McGraph and 4 Grade View: Grade I Tube type: Oral Tube size: 7.0 mm Number of attempts: 1 Airway Equipment and Method: Stylet,  Video-laryngoscopy and LTA kit utilized Placement Confirmation: ETT inserted through vocal cords under direct vision,  positive ETCO2 and breath sounds checked- equal and bilateral Secured at: 21 cm Tube secured with: Tape Dental Injury: Teeth and Oropharynx as per pre-operative assessment  Difficulty Due To: Difficulty was anticipated, Difficult Airway- due to large tongue, Difficult Airway- due to anterior larynx and Difficult Airway- due to limited oral opening       

## 2019-12-23 NOTE — Progress Notes (Signed)
Notified NP, [place] regarding patient's blood sugar of 534. Orders given for 20 units of novolog and q4 CBG.  Will continue to monitor.  Christene Slates 12/23/2019  11:16 PM

## 2019-12-23 NOTE — Transfer of Care (Signed)
Immediate Anesthesia Transfer of Care Note  Patient: [name]  Procedure(s) Performed: Right SIMPLE MASTECTOMY WITH AXILLARY SENTINEL NODE BIOPSY (Right )  Patient Location: PACU  Anesthesia Type:General  Level of Consciousness: drowsy  Airway & Oxygen Therapy: Patient Spontanous Breathing and Patient connected to face mask oxygen  Post-op Assessment: Report given to RN  Post vital signs: stable  Last Vitals:  Vitals Value Taken Time  BP 128/73 12/23/19 1132  Temp    Pulse 80 12/23/19 1134  Resp 19 12/23/19 1134  SpO2 95 % 12/23/19 1134  Vitals shown include unvalidated device data.  Last Pain:  Vitals:   12/23/19 0826  TempSrc: Temporal  PainSc: 3          Complications: No complications documented.

## 2019-12-23 NOTE — Op Note (Signed)
  Procedure Date:  12/23/2019  Pre-operative Diagnosis: Invasive mammary carcinoma right breast.  Post-operative Diagnosis: Same  Procedure: Right simple mastectomy and sentinel lymph node biopsy  Surgeon:  Ronny Bacon, M.D., [facility]  Anesthesia:  General endotracheal  Estimated Blood Loss: 25 ml  Specimens: Right breast tissue, sentinel lymph node with adjacent associated axillary tissues.  Complications: None.  Indications for Procedure:  This is a 49 y.o. female who presents with invasive mammary carcinoma right breast ER positive.  The risks of bleeding, infection, injury to surrounding structures, hematoma, seroma, open wound, cosmetic deformity, and the need for further surgery were all discussed with the patient and was willing to proceed.  Prior to this procedure, the patient had undergone sentinel lymphoscintigraphy.  Description of Procedure: The patient was correctly identified in the preoperative area and brought into the operating room.  The patient was placed supine with VTE prophylaxis in place.  Appropriate time-outs were performed.  Anesthesia was induced and the patient was intubated.  Appropriate antibiotics were infused.  A 5 mL of blue Lymphazurin dye was injected in the right periareolar region under aseptic conditions, massage administered for 5 minutes. The right chest and axilla were prepped and draped in usual sterile fashion.   An elliptical incision was measured and then completed over the breast encompassing the nipple-areolar complex, with attention to the lesion of which we are concerned.  Using electrosurgery, subcutaneous flaps were created superiorly to the clavicle, inferiorly to the inframammary fold, medially to the sternum, and laterally to the latissimus dorsi with careful attention to create flaps of adequate thickness.  The breast tissue was then dissected with the pectoralis fascia as the deep margin.  2-0 silk suture was used to mark the specimen as  short superior and long lateral.  The cavity was then irrigated and hemostasis was assured with electrocautery.   Then using the hand-held probe an area of high counts was identified in the axilla, cautery was used to dissect down and the hand-held probe was used to guide dissection. A hot and blue lymph node was identified and resected, along with an adjacent partially transected cold lymph node and surrounding adipose tissues.  The blue node had a count of 1500.   The wound bed had a count of less than 20.  The cavity was irrigated and hemostasis was assured with electrocautery.   8 Fr Blake drain was placed via lateral stab incision to drain the mastectomy wound bed.  The incision was then closed in two layers with 3-0 Vicryl and staples.  The wound was dressed with fluff gauze and a surgical bra was placed.  The patient was emerged from anesthesia and extubated and brought to the recovery room for further management.  The patient tolerated the procedure well and all counts were correct at the end of the case.   Ronny Bacon M.D., FACS 12/23/2019, 11:25 AM

## 2019-12-23 NOTE — Telephone Encounter (Signed)
Faxed the order to [facility]

## 2019-12-23 NOTE — Anesthesia Preprocedure Evaluation (Signed)
Anesthesia Evaluation  Patient identified by MRN, date of birth, ID band Patient awake    Reviewed: Allergy & Precautions, NPO status , Patient's Chart, lab work & pertinent test results  Airway Mallampati: II  TM Distance: >3 FB Neck ROM: Full    Dental no notable dental hx. (+) Teeth Intact, Dental Advisory Given   Pulmonary shortness of breath and with exertion, asthma , sleep apnea , COPD, Current Smoker, former smoker,    Pulmonary exam normal        Cardiovascular hypertension, Pt. on medications + CAD, + Peripheral Vascular Disease, +CHF and + Orthopnea  Normal cardiovascular exam+ dysrhythmias      Neuro/Psych    GI/Hepatic GERD  Controlled and Medicated,  Endo/Other  diabetes, Type 2, Insulin Dependent  Renal/GU Renal InsufficiencyRenal disease  Female GU complaint     Musculoskeletal   Abdominal   Peds negative pediatric ROS (+)  Hematology   Anesthesia Other Findings Past Medical History: No date: Anxiety     Comment:  takes Prozac daily No date: Anxiety No date: Aortic valve calcification No date: Asthma     Comment:  Advair and Spirva daily No date: Asthma No date: Bipolar disorder (HCC) No date: CAD (coronary artery disease)     Comment:  a. LHC 11/2013 done for CP/fluid retention: mild disease               in prox LAD, mild-mod disease in mRCA, EF 60% with normal              LVEDP. b. Normal nuc 03/2016. No date: CHF (congestive heart failure) (HCC) No date: Chronic diastolic CHF (congestive heart failure) ([place]) 11/16/2018: Chronic heart failure with preserved ejection fraction (HCC) No date: CKD (chronic kidney disease), stage II No date: COPD (chronic obstructive pulmonary disease) (HCC)     Comment:  a. nocturnal O2. No date: COPD (chronic obstructive pulmonary disease) (HCC) No date: Coronary artery disease No date: Decreased urine stream No date: Diabetes mellitus No date: Diabetes  mellitus without complication (HCC) No date: Dyspnea No date: Family history of adverse reaction to anesthesia     Comment:  mom gets nauseated No date: GERD (gastroesophageal reflux disease)     Comment:  takes Pepcid daily No date: History of blood clots     Comment:  left leg 3-51yrs ago No date: Hyperlipidemia No date: Hypertension No date: Hypertriglyceridemia 01/2015: Inguinal hernia, left No date: Muscle spasm No date: Open wound of genital labia No date: Peripheral neuropathy No date: RBBB No date: Seizures ([place]) 01/19/2018: Sepsis ([place]) No date: Sinus tachycardia     Comment:  a. persistent since 2009. No date: Smokers' cough ([place]) 1989: Stroke [facility])     Comment:  left sided weakness No date: TIA (transient ischemic attack) No date: Tobacco abuse 01/23/2018: Vulvar abscess  Reproductive/Obstetrics                             Anesthesia Physical  Anesthesia Plan  ASA: III  Anesthesia Plan: General   Post-op Pain Management:    Induction: Intravenous  PONV Risk Score and Plan: 3 and Treatment may vary due to age or medical condition and Ondansetron  Airway Management Planned: Oral ETT  Additional Equipment:   Intra-op Plan:   Post-operative Plan: Extubation in OR  Informed Consent: I have reviewed the patients History and Physical, chart, labs and discussed the procedure including the risks, benefits and alternatives  for the proposed anesthesia with the patient or authorized representative who has indicated his/her understanding and acceptance.     Dental advisory given  Plan Discussed with: CRNA  Anesthesia Plan Comments:           Anesthesia Quick Evaluation

## 2019-12-23 NOTE — Telephone Encounter (Signed)
Noted, Rx written and placed in Chan's inbox. 

## 2019-12-23 NOTE — Interval H&P Note (Signed)
History and Physical Interval Note:  12/23/2019 8:37 AM  [name]  has presented today for surgery, with the diagnosis of Right breast cancer.  The various methods of treatment have been discussed with the patient and family. After consideration of risks, benefits and other options for treatment, the patient has consented to  Procedure(s): Right [place] (Right) as a surgical intervention.  The patient's history has been reviewed, patient examined, no change in status, stable for surgery.  I have reviewed the patient's chart and labs.  Questions were answered to the patient's satisfaction.   Right breast is marked correct. Blood glucose is 370's today.  Risks discussion/management in process.   Ronny Bacon, M.D., [facility]  12/23/2019 ; 8:38 AM

## 2019-12-23 NOTE — Telephone Encounter (Signed)
Medical Clearance obtained from [facility] from a diabetes standpoint to proceed. Recommend insulin drip if in ICU after surgery. Medium risk-optimized for surgery.

## 2019-12-24 ENCOUNTER — Observation Stay: Payer: BC Managed Care – PPO | Admitting: Certified Registered"

## 2019-12-24 ENCOUNTER — Encounter
Admission: RE | Disposition: A | Discharge status: Home Health | Payer: Self-pay | Source: Home / Self Care | Attending: Internal Medicine

## 2019-12-24 ENCOUNTER — Encounter: Payer: Self-pay | Admitting: Surgery

## 2019-12-24 DIAGNOSIS — G4733 Obstructive sleep apnea (adult) (pediatric): Secondary | ICD-10-CM | POA: Diagnosis not present

## 2019-12-24 DIAGNOSIS — F319 Bipolar disorder, unspecified: Secondary | ICD-10-CM | POA: Diagnosis not present

## 2019-12-24 DIAGNOSIS — E1165 Type 2 diabetes mellitus with hyperglycemia: Secondary | ICD-10-CM | POA: Diagnosis not present

## 2019-12-24 DIAGNOSIS — F17210 Nicotine dependence, cigarettes, uncomplicated: Secondary | ICD-10-CM | POA: Diagnosis not present

## 2019-12-24 DIAGNOSIS — N179 Acute kidney failure, unspecified: Secondary | ICD-10-CM

## 2019-12-24 DIAGNOSIS — J449 Chronic obstructive pulmonary disease, unspecified: Secondary | ICD-10-CM | POA: Diagnosis not present

## 2019-12-24 DIAGNOSIS — C50511 Malignant neoplasm of lower-outer quadrant of right female breast: Secondary | ICD-10-CM | POA: Diagnosis not present

## 2019-12-24 DIAGNOSIS — Z66 Do not resuscitate: Secondary | ICD-10-CM | POA: Diagnosis not present

## 2019-12-24 DIAGNOSIS — E1142 Type 2 diabetes mellitus with diabetic polyneuropathy: Secondary | ICD-10-CM | POA: Diagnosis not present

## 2019-12-24 DIAGNOSIS — E781 Pure hyperglyceridemia: Secondary | ICD-10-CM | POA: Diagnosis not present

## 2019-12-24 DIAGNOSIS — N189 Chronic kidney disease, unspecified: Secondary | ICD-10-CM

## 2019-12-24 DIAGNOSIS — Z170 Estrogen receptor positive status [ER+]: Secondary | ICD-10-CM | POA: Diagnosis not present

## 2019-12-24 DIAGNOSIS — I5032 Chronic diastolic (congestive) heart failure: Secondary | ICD-10-CM | POA: Diagnosis not present

## 2019-12-24 DIAGNOSIS — Z794 Long term (current) use of insulin: Secondary | ICD-10-CM

## 2019-12-24 DIAGNOSIS — E1122 Type 2 diabetes mellitus with diabetic chronic kidney disease: Secondary | ICD-10-CM | POA: Diagnosis not present

## 2019-12-24 DIAGNOSIS — C50911 Malignant neoplasm of unspecified site of right female breast: Secondary | ICD-10-CM | POA: Diagnosis not present

## 2019-12-24 DIAGNOSIS — I130 Hypertensive heart and chronic kidney disease with heart failure and stage 1 through stage 4 chronic kidney disease, or unspecified chronic kidney disease: Secondary | ICD-10-CM | POA: Diagnosis not present

## 2019-12-24 DIAGNOSIS — Z720 Tobacco use: Secondary | ICD-10-CM | POA: Diagnosis not present

## 2019-12-24 DIAGNOSIS — E785 Hyperlipidemia, unspecified: Secondary | ICD-10-CM | POA: Diagnosis not present

## 2019-12-24 DIAGNOSIS — N1832 Chronic kidney disease, stage 3b: Secondary | ICD-10-CM | POA: Diagnosis not present

## 2019-12-24 LAB — BASIC METABOLIC PANEL
Anion gap: 15 (ref 5–15)
BUN: 70 mg/dL — ABNORMAL HIGH (ref 6–20)
CO2: 27 mmol/L (ref 22–32)
Calcium: 8.7 mg/dL — ABNORMAL LOW (ref 8.9–10.3)
Chloride: 88 mmol/L — ABNORMAL LOW (ref 98–111)
Creatinine, Ser: 2.21 mg/dL — ABNORMAL HIGH (ref 0.44–1.00)
GFR calc Af Amer: 29 mL/min — ABNORMAL LOW (ref >60)
GFR calc non Af Amer: 25 mL/min — ABNORMAL LOW (ref >60)
Glucose, Bld: 494 mg/dL — ABNORMAL HIGH (ref 70–99)
Potassium: 4.3 mmol/L (ref 3.5–5.1)
Sodium: 130 mmol/L — ABNORMAL LOW (ref 135–145)

## 2019-12-24 LAB — GLUCOSE, CAPILLARY
Glucose-Capillary: 280 mg/dL — ABNORMAL HIGH (ref 70–99)
Glucose-Capillary: 340 mg/dL — ABNORMAL HIGH (ref 70–99)
Glucose-Capillary: 360 mg/dL — ABNORMAL HIGH (ref 70–99)
Glucose-Capillary: 371 mg/dL — ABNORMAL HIGH (ref 70–99)
Glucose-Capillary: 390 mg/dL — ABNORMAL HIGH (ref 70–99)
Glucose-Capillary: 429 mg/dL — ABNORMAL HIGH (ref 70–99)
Glucose-Capillary: 483 mg/dL — ABNORMAL HIGH (ref 70–99)
Glucose-Capillary: 521 mg/dL (ref 70–99)
Glucose-Capillary: 542 mg/dL (ref 70–99)
Glucose-Capillary: 542 mg/dL (ref 70–99)
Glucose-Capillary: >600 mg/dL (ref 70–99)

## 2019-12-24 LAB — CBC
HCT: 38.3 % (ref 36.0–46.0)
Hemoglobin: 12.5 g/dL (ref 12.0–15.0)
MCH: 27.1 pg (ref 26.0–34.0)
MCHC: 32.6 g/dL (ref 30.0–36.0)
MCV: 82.9 fL (ref 80.0–100.0)
Platelets: 202 10*3/uL (ref 150–400)
RBC: 4.62 MIL/uL (ref 3.87–5.11)
RDW: 16.9 % — ABNORMAL HIGH (ref 11.5–15.5)
WBC: 15 10*3/uL — ABNORMAL HIGH (ref 4.0–10.5)
nRBC: 0 % (ref 0.0–0.2)

## 2019-12-24 LAB — HEMOGLOBIN AND HEMATOCRIT, BLOOD
HCT: 37.9 % (ref 36.0–46.0)
Hemoglobin: 12.2 g/dL (ref 12.0–15.0)

## 2019-12-24 SURGERY — WOUND EXPLORATION
Anesthesia: General | Laterality: Right

## 2019-12-24 MED ORDER — INSULIN ASPART 100 UNIT/ML ~~LOC~~ SOLN
35.0000 [IU] | Freq: Once | SUBCUTANEOUS | Status: AC
  Administered 2019-12-24: 35 [IU] via SUBCUTANEOUS
  Filled 2019-12-24: qty 1

## 2019-12-24 MED ORDER — LIDOCAINE HCL (PF) 2 % IJ SOLN
INTRAMUSCULAR | Status: AC
  Filled 2019-12-24: qty 5

## 2019-12-24 MED ORDER — SODIUM CHLORIDE 0.9 % IV SOLN: INTRAVENOUS | Status: DC

## 2019-12-24 MED ORDER — ROCURONIUM BROMIDE 10 MG/ML (PF) SYRINGE
PREFILLED_SYRINGE | INTRAVENOUS | Status: AC
  Filled 2019-12-24: qty 10

## 2019-12-24 MED ORDER — EPINEPHRINE PF 1 MG/ML IJ SOLN
INTRAMUSCULAR | Status: AC
  Filled 2019-12-24: qty 1

## 2019-12-24 MED ORDER — INSULIN REGULAR HUMAN (CONC) 500 UNIT/ML ~~LOC~~ SOPN
30.0000 [IU] | PEN_INJECTOR | Freq: Two times a day (BID) | SUBCUTANEOUS | Status: DC
  Administered 2019-12-24: 30 [IU] via SUBCUTANEOUS
  Administered 2019-12-24: 80 [IU] via SUBCUTANEOUS
  Filled 2019-12-24: qty 3

## 2019-12-24 MED ORDER — PROPOFOL 10 MG/ML IV BOLUS
INTRAVENOUS | Status: AC
  Filled 2019-12-24: qty 20

## 2019-12-24 MED ORDER — SUCCINYLCHOLINE CHLORIDE 200 MG/10ML IV SOSY
PREFILLED_SYRINGE | INTRAVENOUS | Status: AC
  Filled 2019-12-24: qty 10

## 2019-12-24 MED ORDER — MIDAZOLAM HCL 2 MG/2ML IJ SOLN
INTRAMUSCULAR | Status: AC
  Filled 2019-12-24: qty 2

## 2019-12-24 MED ORDER — FENTANYL CITRATE (PF) 100 MCG/2ML IJ SOLN
INTRAMUSCULAR | Status: AC
  Filled 2019-12-24: qty 2

## 2019-12-24 MED ORDER — BUPIVACAINE HCL (PF) 0.25 % IJ SOLN
INTRAMUSCULAR | Status: AC
  Filled 2019-12-24: qty 30

## 2019-12-24 SURGICAL SUPPLY — 27 items
BLADE SURG 15 STRL LF DISP TIS (BLADE) ×1 IMPLANT
BLADE SURG 15 STRL SS (BLADE) ×1
CANISTER SUCT 3000ML PPV (MISCELLANEOUS) ×2 IMPLANT
COVER WAND RF STERILE (DRAPES) ×2 IMPLANT
DRAIN PENROSE 1/4X12 LTX STRL (WOUND CARE) IMPLANT
DRAIN PENROSE 5/8X18 LTX STRL (DRAIN) IMPLANT
DRAPE LAPAROTOMY 77X122 PED (DRAPES) IMPLANT
ELECT CAUTERY BLADE 6.4 (BLADE) ×2 IMPLANT
ELECT REM PT RETURN 9FT ADLT (ELECTROSURGICAL) ×2
ELECTRODE REM PT RTRN 9FT ADLT (ELECTROSURGICAL) ×1 IMPLANT
GAUZE SPONGE 4X4 12PLY STRL (GAUZE/BANDAGES/DRESSINGS) IMPLANT
GLOVE ORTHO TXT STRL SZ7.5 (GLOVE) ×2 IMPLANT
GOWN STRL REUS W/ TWL LRG LVL3 (GOWN DISPOSABLE) IMPLANT
GOWN STRL REUS W/TWL LRG LVL3 (GOWN DISPOSABLE)
KIT TURNOVER KIT A (KITS) ×2 IMPLANT
NEEDLE HYPO 22GX1.5 SAFETY (NEEDLE) ×2 IMPLANT
NS IRRIG 1000ML POUR BTL (IV SOLUTION) ×2 IMPLANT
PACK BASIN MINOR (MISCELLANEOUS) ×2 IMPLANT
PAD ABD DERMACEA PRESS 5X9 (GAUZE/BANDAGES/DRESSINGS) IMPLANT
SOL PREP PVP 2OZ (MISCELLANEOUS) ×2
SOLUTION PREP PVP 2OZ (MISCELLANEOUS) ×1 IMPLANT
SPONGE LAP 18X18 RF (DISPOSABLE) ×2 IMPLANT
SUT ETHILON 3-0 FS-10 30 BLK (SUTURE)
SUTURE EHLN 3-0 FS-10 30 BLK (SUTURE) IMPLANT
SWAB CULTURE AMIES ANAERIB BLU (MISCELLANEOUS) ×2 IMPLANT
SYR 10ML LL (SYRINGE) ×2 IMPLANT
SYR BULB IRRIG 60ML STRL (SYRINGE) IMPLANT

## 2019-12-24 NOTE — Progress Notes (Signed)
Patient ID: [name], female   DOB: [birth year], 49 y.o.   MRN: [id number] [facility] PROGRESS NOTE  Shamyah Stantz CVE:[id number] DOB: 04-28-1971 DOA: 12/23/2019 PCP: Pleas Koch, NP  HPI/Subjective: Patient had quite a bit of bleeding last night through the JP drain days nurses had emptied every 2 hours.  Case discussed with surgery and they will take back to the OR today.  Patient still having a lot of pain in her right chest at the site of mastectomy.  Sugars very high last night secondary to Decadron given.  Objective: Vitals:   12/24/19 0640 12/24/19 1153  BP: 126/69 114/66  Pulse: 75 74  Resp: 18 15  Temp:  (!) 97.5 F (36.4 C)  SpO2: 96% 100%    Intake/Output Summary (Last 24 hours) at 12/24/2019 1246 Last data filed at 12/24/2019 1100 Gross per 24 hour  Intake 921.44 ml  Output 2818 ml  Net -1896.56 ml   Filed Weights   12/23/19 0826  Weight: 127.9 kg    ROS: Review of Systems  Constitutional: Negative for fever.  Eyes: Negative for blurred vision.  Respiratory: Negative for cough and shortness of breath.   Cardiovascular: Positive for chest pain.  Gastrointestinal: Negative for abdominal pain, nausea and vomiting.  Genitourinary: Negative for dysuria.  Musculoskeletal: Negative for joint pain.  Neurological: Negative for headaches.   Exam: Physical Exam  HENT:  Nose: No mucosal edema.  Mouth/Throat: No oropharyngeal exudate.  Eyes: Pupils are equal, round, and reactive to light. Conjunctivae and lids are normal.  Neck: Carotid bruit is not present.  Cardiovascular: S1 normal and S2 normal. Exam reveals no gallop.  No murmur heard. Respiratory: No respiratory distress. She has no wheezes. She has no rhonchi. She has no rales.  GI: Soft. Bowel sounds are normal. There is no abdominal tenderness.  Musculoskeletal:     Right ankle: Swelling present.     Left ankle: Swelling present.  Neurological: She is alert. No cranial nerve deficit.   Skin: Skin is warm. Nails show no clubbing.      Data Reviewed: Basic Metabolic Panel: Recent Labs  Lab 12/24/19 0531  NA 130*  K 4.3  CL 88*  CO2 27  GLUCOSE 494*  BUN 70*  CREATININE 2.21*  CALCIUM 8.7*   CBC: Recent Labs  Lab 12/24/19 0531  WBC 15.0*  HGB 12.5  HCT 38.3  MCV 82.9  PLT 202   BNP (last 3 results) Recent Labs    11/08/19 1022 11/27/19 2214  BNP 59.0 68.4     CBG: Recent Labs  Lab 12/24/19 0132 12/24/19 0437 12/24/19 0747 12/24/19 1012 12/24/19 1152  GLUCAP 542* 483* 521* 429* 360*    Recent Results (from the past 240 hour(s))  SARS CORONAVIRUS 2 (TAT 6-24 HRS) Nasopharyngeal Nasopharyngeal Swab     Status: None   Collection Time: 12/20/19  9:34 AM   Specimen: Nasopharyngeal Swab  Result Value Ref Range Status   SARS Coronavirus 2 NEGATIVE NEGATIVE Final    Comment: (NOTE) SARS-CoV-2 target nucleic acids are NOT DETECTED.  The SARS-CoV-2 RNA is generally detectable in upper and lower respiratory specimens during the acute phase of infection. Negative results do not preclude SARS-CoV-2 infection, do not rule out co-infections with other pathogens, and should not be used as the sole basis for treatment or other patient management decisions. Negative results must be combined with clinical observations, patient history, and epidemiological information. The expected result is Negative.  Fact Sheet for Patients: SugarRoll.be  Fact Sheet for Healthcare Providers: https://www.woods-mathews.com/  This test is not yet approved or cleared by the [place] FDA and  has been authorized for detection and/or diagnosis of SARS-CoV-2 by FDA under an Emergency Use Authorization (EUA). This EUA will remain  in effect (meaning this test can be used) for the duration of the COVID-19 declaration under Se ction 564(b)(1) of the Act, 21 U.S.C. section 360bbb-3(b)(1), unless the authorization is  terminated or revoked sooner.  Performed at [facility], [place] [address], [place],  [address]      Studies: NM SENTINEL NODE INJECTION  Result Date: 12/23/2019 CLINICAL DATA:  Right breast cancer. EXAM: NUCLEAR MEDICINE BREAST LYMPHOSCINTIGRAPHY TECHNIQUE: Intradermal injection of radiopharmaceutical was performed at the 12 o'clock, 3 o'clock, 6 o'clock, and 9 o'clock positions around the right nipple. The patient was then sent to the operating room where the sentinel node(s) were identified and removed by the surgeon. RADIOPHARMACEUTICALS:  Total of 1 mCi Millipore-filtered Technetium-20m sulfur colloid, injected in four aliquots of 0.25 mCi each. IMPRESSION: Uncomplicated intradermal injection of a total of 1 mCi Technetium-47m sulfur colloid for purposes of sentinel node identification. Electronically Signed   By: Sandi Mariscal M.D.   On: 12/23/2019 09:13    Scheduled Meds: . busPIRone  30 mg Oral BID  . citalopram  20 mg Oral Daily  . doxycycline  100 mg Oral BID  . famotidine  20 mg Oral BID  . FLUoxetine  20 mg Oral Daily  . insulin regular human CONCENTRATED  30 Units Subcutaneous BID WC  . mometasone-formoterol  2 puff Inhalation BID  . nicotine  21 mg Transdermal Daily  . pregabalin  300 mg Oral BID  . rOPINIRole  1 mg Oral QHS  . rosuvastatin  10 mg Oral QHS  . spironolactone  50 mg Oral Daily  . tamoxifen  20 mg Oral Daily  . traZODone  200 mg Oral QHS   Continuous Infusions: . sodium chloride 50 mL/hr at 12/24/19 0931    Assessment/Plan:  1. Type 2 diabetes mellitus with hyperglycemia.  Patient sugars last night were greater than 600.  Patient was given her U500 last night but then switched over to sliding scale insulin.  Case discussed with diabetes coordinator and pharmacist and will switched back to U500 insulin this morning.  Patient given 80 units this morning. 2. Postoperative bleeding patient will go back to the operating room today.  Hold aspirin  and Lovenox injections.  Case discussed with general surgery. 3. Acute kidney injury on chronic kidney disease stage IIIa.  Creatinine up to 2.2 today.  Hold Zaroxolyn.  Hold torsemide.  Hold spironolactone.  IV fluid hydration. 4. End-stage COPD on 2.5 L of oxygen during the day 3.5 L at night 5. Diabetic neuropathy on Lyrica 6. Chronic diastolic congestive heart failure.  Continue to monitor.  Currently this morning no signs of heart failure. 7. Breast cancer history status post right mastectomy yesterday 8. Tobacco abuse on nicotine patch 9. Anxiety depression and bipolar disorder continue psychiatric medications 10. Hyperlipidemia unspecified on Crestor    Code Status:     Code Status Orders  (From admission, onward)         Start     Ordered   12/23/19 1253  Full code  Continuous        12/23/19 1252        Code Status History    Date Active Date Inactive Code Status Order ID Comments User Context  01/19/2018 2101 02/05/2018 1642 DNR [id number]  Gwynne Edinger, MD Inpatient   03/14/2017 1811 03/19/2017 1520 Full Code [id number]  Adrian Prows, MD Inpatient   02/28/2017 0339 02/28/2017 1901 Full Code [id number]  Vianne Bulls, MD ED   05/16/2016 1537 05/18/2016 1542 Full Code [id number]  Liliane Shi, PA-C Inpatient   11/03/2015 1311 11/04/2015 1337 Full Code [id number]  Mariel Aloe, MD Inpatient   12/05/2013 1032 12/05/2013 1724 Full Code [id number]  Jettie Booze, MD Inpatient   04/01/2013 1436 04/02/2013 2355 Full Code [id number]  Melony Overly, MD Inpatient   04/01/2013 0940 04/01/2013 1436 Full Code [id number]  Gerda Diss, DO Outpatient   Advance Care Planning Activity     Family Communication: Spoke with the patient's husband and brother at the bedside Disposition Plan: Status is: Observation  Dispo: The patient is from: Home              Anticipated d/c is to: Home              Anticipated d/c date is: Dependent on general surgery.  Since the patient going back to  the operating room today disposition will not be today.              Patient currently going back to the operating room for postoperative bleeding today.  Also giving IV fluids for acute kidney injury.  Time spent: 28 minutes.  Case discussed with general surgery, diabetes coordinator, pharmacist  Loletha Grayer  [facility]

## 2019-12-24 NOTE — Progress Notes (Signed)
Inpatient Diabetes Program Recommendations  AACE/ADA: New Consensus Statement on Inpatient Glycemic Control   Target Ranges:  Prepandial:   less than 140 mg/dL      Peak postprandial:   less than 180 mg/dL (1-2 hours)      Critically ill patients:  140 - 180 mg/dL  Results for [name], [name] (MRN [id number]) as of 12/24/2019 06:43  Ref. Range 12/24/2019 00:20 12/24/2019 00:40 12/24/2019 01:32 12/24/2019 04:37  Glucose-Capillary Latest Ref Range: 70 - 99 mg/dL >600 (HH) 542 (HH)   542 (HH)  Novolog 35 units 483 (H)  Novolog 20 units   Results for [name], [name] (MRN [id number]) as of 12/24/2019 06:43  Ref. Range 12/23/2019 08:25 12/23/2019 09:03 12/23/2019 10:24 12/23/2019 11:34 12/23/2019 16:42 12/23/2019 21:46  Glucose-Capillary Latest Ref Range: 70 - 99 mg/dL 376 (H)  Novolog 10 units 344 (H) 325 (H)  Novolog 10 units 332 (H)    Decadron 5 mg 459 (H)   Humulin R U500 50 units 534 (HH)  Novolog 20 units   Review of Glycemic Control  Diabetes history: DM2 Outpatient Diabetes medications: Humulin  R U500 0-80 units BID (if CBG in 200's or less takes none, if CBG in 300's takes 30 units, if CBG over 500 mg/dl takes 80 units), Xigduo XR 5-500 mg daily Current orders for Inpatient glycemic control: Novolog 0-20 units Q4H  Inpatient Diabetes Program Recommendations:    Insulin-Noted patient received Humulin R U500 50 units at 17:03 on 12/23/19 and Humulin R U500 has been discontinued. Patient is very resistant to insulin and will need to have Humulin R U500 ordered while inpatient to improve glycemic control. Please consider ordering Humulin R U500 30-80 units BID (if CBG 299 or less give 30 units, if CBG 300-399 give 40 units, if CBG 400-499 give 50 units, if CBG 500 mg/dl or greater give 80 units).   Thanks, Barnie Alderman, RN, MSN, CDE Diabetes Coordinator Inpatient Diabetes Program [phone] (Team Pager from 8am to 5pm)

## 2019-12-24 NOTE — Care Management Obs Status (Signed)
[place] NOTIFICATION   Patient Details  Name: [name] MRN: [id number] Date of Birth: 1971/03/24   Medicare Observation Status Notification Given:  Yes    Candie Chroman, LCSW 12/24/2019, 2:35 PM

## 2019-12-24 NOTE — Progress Notes (Signed)
[place] Hospital Day(s): 0.   Post op day(s): 1 Day Post-Op.   Interval History:  Patient seen and examined no acute events or new complaints overnight.  Patient reports no issues with pain No nausea or emesis Leukocytosis to 15K; likely reactive from surgery Acute on chronic kidney injury, sCr - 2..21, UO - 1.2L Mild hyponatremia to 130 Hyperglycemic to 494 Surgical drain with 433 ccs out since placement She has tolerated a soft diet post-operatively   Vital signs in last 24 hours: [min-max] current  Temp:  [97 F (36.1 C)-98.6 F (37 C)] 97.6 F (36.4 C) (06/15 0439) Pulse Rate:  [73-81] 75 (06/15 0640) Resp:  [10-20] 18 (06/15 0640) BP: (117-148)/(61-131) 126/69 (06/15 0640) SpO2:  [92 %-98 %] 96 % (06/15 0640) Weight:  [127.9 kg] 127.9 kg (06/14 0826)     Height: 5\' 4"  (162.6 cm) Weight: 127.9 kg BMI (Calculated): 48.38   Intake/Output last 2 shifts:  06/14 0701 - 06/15 0700 In: 1621.4 [I.V.:1421.4; IV Piggyback:200] Out: 5056 [Urine:1200; Drains:433; Blood:25]   Physical Exam:  Constitutional: alert, cooperative and no distress  Respiratory: breathing non-labored at rest  Cardiovascular: regular rate and sinus rhythm  Integumentary: Right beast incision is CDI with staples, no significant ecchymosis, no erythema or drainage, the medial portion of the wound does appear "more puffy" then other portions of the wound, the JP drain does have quite a significant output of old blood  Labs:  CBC Latest Ref Rng & Units 12/24/2019 12/16/2019 11/27/2019  WBC 4.0 - 10.5 K/uL 15.0(H) 14.2(H) 12.2(H)  Hemoglobin 12.0 - 15.0 g/dL 12.5 13.7 12.2  Hematocrit 36 - 46 % 38.3 42.0 37.8  Platelets 150 - 400 K/uL 202 197 196   CMP Latest Ref Rng & Units 12/24/2019 12/16/2019 11/27/2019  Glucose 70 - 99 mg/dL 494(H) 183(H) 407(H)  BUN 6 - 20 mg/dL 70(H) 60(H) 57(H)  Creatinine 0.44 - 1.00 mg/dL 2.21(H) 1.93(H) 1.79(H)  Sodium 135 - 145 mmol/L  130(L) 134(L) 133(L)  Potassium 3.5 - 5.1 mmol/L 4.3 3.8 4.4  Chloride 98 - 111 mmol/L 88(L) 89(L) 95(L)  CO2 22 - 32 mmol/L 27 31 26   Calcium 8.9 - 10.3 mg/dL 8.7(L) 9.2 8.8(L)  Total Protein 6.5 - 8.1 g/dL - 8.8(H) 8.8(H)  Total Bilirubin 0.3 - 1.2 mg/dL - 1.2 1.0  Alkaline Phos 38 - 126 U/L - 114 116  AST 15 - 41 U/L - 27 34  ALT 0 - 44 U/L - 26 35    Imaging studies: No new pertinent imaging studies   Assessment/Plan:  49 y.o. female 1 Day Post-Op s/p right simple mastectomy and SLNB for invasive mammary carcinoma of the right breast, complicated by multiple pertinent comorbidities including uncontrolled DM, AKI, hyponatremia.   - NPO + IVF Resuscitation   - Given high output drain concerning for bleeding we will plan on re-exploration of the wound today to control any potential bleeding. Timing per Dr Christian Mate and Anesthesia availability   - Pain control prn; antiemetics prn  - Monitor abdominal examination  - Monitor labs: CBC, CBG   - continue breast binder   - Appreciate medicine assistance with comorbid conditions   All of the above findings and recommendations were discussed with the patient, and the medical team, and all of patient's questions were answered to her expressed satisfaction.  -- Edison Simon, PA-C [facility] 12/24/2019, 7:31 AM [phone] M-F: 7am - 4pm

## 2019-12-24 NOTE — Anesthesia Postprocedure Evaluation (Signed)
Anesthesia Post Note  Patient: [name]  Procedure(s) Performed: Right SIMPLE MASTECTOMY WITH AXILLARY SENTINEL NODE BIOPSY (Right )  Patient location during evaluation: PACU Anesthesia Type: General Level of consciousness: awake and alert and oriented Pain management: pain level controlled Vital Signs Assessment: post-procedure vital signs reviewed and stable Respiratory status: spontaneous breathing Cardiovascular status: blood pressure returned to baseline Anesthetic complications: no   No complications documented.   Last Vitals:  Vitals:   12/24/19 0640 12/24/19 1153  BP: 126/69 114/66  Pulse: 75 74  Resp: 18 15  Temp:  (!) 36.4 C  SpO2: 96% 100%    Last Pain:  Vitals:   12/24/19 1153  TempSrc: Oral  PainSc:                  [name]

## 2019-12-24 NOTE — Progress Notes (Signed)
Notified NP, [place] regarding blood sugar increasing to 542 after 20 units was given @ 2230. Orders were given for 35 units of novolog. Will recheck at 0400.  [name]  12/24/2019  1:41 AM

## 2019-12-24 NOTE — Anesthesia Preprocedure Evaluation (Deleted)
Anesthesia Evaluation  Patient identified by MRN, date of birth, ID band Patient awake    Reviewed: Allergy & Precautions, H&P , NPO status , Patient's Chart, lab work & pertinent test results, reviewed documented beta blocker date and time   Airway Mallampati: II  TM Distance: >3 FB Neck ROM: full    Dental  (+) Teeth Intact   Pulmonary shortness of breath and with exertion, asthma , sleep apnea , COPD,  COPD inhaler, Current Smoker,    Pulmonary exam normal        Cardiovascular Exercise Tolerance: Poor hypertension, On Medications + CAD, + Peripheral Vascular Disease, +CHF and + Orthopnea  Normal cardiovascular exam+ dysrhythmias  Rhythm:regular Rate:Normal     Neuro/Psych Seizures -,  PSYCHIATRIC DISORDERS Anxiety Bipolar Disorder TIA Neuromuscular disease CVA    GI/Hepatic Neg liver ROS, GERD  Medicated,  Endo/Other  negative endocrine ROSdiabetes  Renal/GU Renal disease  negative genitourinary   Musculoskeletal   Abdominal   Peds  Hematology negative hematology ROS (+)   Anesthesia Other Findings Past Medical History: No date: Anxiety     Comment:  takes Prozac daily No date: Anxiety No date: Aortic valve calcification No date: Asthma     Comment:  Advair and Spirva daily No date: Asthma No date: Bipolar disorder (HCC) No date: CAD (coronary artery disease)     Comment:  a. LHC 11/2013 done for CP/fluid retention: mild disease               in prox LAD, mild-mod disease in mRCA, EF 60% with normal              LVEDP. b. Normal nuc 03/2016. No date: CHF (congestive heart failure) (HCC) No date: Chronic diastolic CHF (congestive heart failure) ([place]) 11/16/2018: Chronic heart failure with preserved ejection fraction (HCC) No date: CKD (chronic kidney disease), stage II No date: COPD (chronic obstructive pulmonary disease) (HCC)     Comment:  a. nocturnal O2. No date: COPD (chronic obstructive pulmonary  disease) (HCC) No date: Coronary artery disease No date: Decreased urine stream No date: Diabetes mellitus No date: Diabetes mellitus without complication (HCC) No date: Dyspnea No date: Family history of adverse reaction to anesthesia     Comment:  mom gets nauseated No date: GERD (gastroesophageal reflux disease)     Comment:  takes Pepcid daily No date: History of blood clots     Comment:  left leg 3-61yrs ago No date: Hyperlipidemia No date: Hypertension No date: Hypertriglyceridemia 01/2015: Inguinal hernia, left No date: Muscle spasm No date: Open wound of genital labia No date: Peripheral neuropathy No date: RBBB No date: Seizures ([place]) 01/19/2018: Sepsis ([place]) No date: Sinus tachycardia     Comment:  a. persistent since 2009. No date: Smokers' cough ([place]) 1989: Stroke [facility])     Comment:  left sided weakness No date: TIA (transient ischemic attack) No date: Tobacco abuse 01/23/2018: Vulvar abscess Past Surgical History: 11/06/2019: BREAST BIOPSY; Right     Comment:  [place] core path pending venus clip No date: HERNIA REPAIR 01/26/2018: INCISION AND DRAINAGE ABSCESS; N/A     Comment:  Procedure: INCISION AND DEBRIDEMENT OF VULVAR               NECROTIZING SOFT TISSUE INFECTION;  Surgeon: Greer Pickerel, MD;  Location: [place];  Service: General;  Laterality: N/A; 01/22/2018: INCISION AND DRAINAGE PERIRECTAL ABSCESS; N/A     Comment:  Procedure: IRRIGATION AND DEBRIDEMENT LABIAL/VULVAR               AREA;  Surgeon: Coralie Keens, MD;  Location: [place];               Service: General;  Laterality: N/A; 01/29/2018: INCISION AND DRAINAGE PERIRECTAL ABSCESS; N/A     Comment:  Procedure: [place];  Surgeon:               Excell Seltzer, MD;  Location: [place];  Service:               General;  Laterality: N/A; 04/08/2015: INGUINAL HERNIA REPAIR; Left     Comment:  Procedure: OPEN LEFT INGUINAL HERNIA REPAIR WITH MESH;                 Surgeon: Ralene Ok, MD;  Location: [place];  Service:              General;  Laterality: Left; 04/08/2015: INSERTION OF MESH; Left     Comment:  Procedure: INSERTION OF MESH;  Surgeon: Ralene Ok,              MD;  Location: [place];  Service: General;  Laterality:               Left; No date: LAPAROSCOPY     Comment:  Endometriosis 12/05/2013: LEFT HEART CATHETERIZATION WITH CORONARY ANGIOGRAM; N/A     Comment:  Procedure: LEFT HEART CATHETERIZATION WITH CORONARY               ANGIOGRAM;  Surgeon: Jettie Booze, MD;  Location:               [facility] CATH LAB;  Service: Cardiovascular;  Laterality: N/A; No date: right kidney drained 12/23/2019: SIMPLE MASTECTOMY WITH AXILLARY SENTINEL NODE BIOPSY; Right     Comment:  Procedure: Right SIMPLE MASTECTOMY WITH AXILLARY               SENTINEL NODE BIOPSY;  Surgeon: Ronny Bacon, MD;                Location: [facility] ORS;  Service: General;  Laterality:               Right; 11/28/2013: TEE WITHOUT CARDIOVERSION; N/A     Comment:  Procedure: TRANSESOPHAGEAL ECHOCARDIOGRAM (TEE);                Surgeon: Thayer Headings, MD;  Location: [facility] ENDOSCOPY;                Service: Cardiovascular;  Laterality: N/A; BMI    Body Mass Index: 48.41 kg/m     Reproductive/Obstetrics negative OB ROS                             Anesthesia Physical Anesthesia Plan  ASA: IV and emergent  Anesthesia Plan: General ETT   Post-op Pain Management:    Induction:   PONV Risk Score and Plan: 3  Airway Management Planned:   Additional Equipment:   Intra-op Plan:   Post-operative Plan:   Informed Consent: I have reviewed the patients History and Physical, chart, labs and discussed the procedure including the risks, benefits and alternatives for the proposed anesthesia with the patient or authorized representative who has indicated his/her understanding and acceptance.  Dental Advisory Given  Plan Discussed with:  CRNA  Anesthesia Plan Comments:         Anesthesia Quick Evaluation

## 2019-12-25 DIAGNOSIS — N179 Acute kidney failure, unspecified: Secondary | ICD-10-CM

## 2019-12-25 DIAGNOSIS — F17210 Nicotine dependence, cigarettes, uncomplicated: Secondary | ICD-10-CM | POA: Diagnosis not present

## 2019-12-25 DIAGNOSIS — F319 Bipolar disorder, unspecified: Secondary | ICD-10-CM | POA: Diagnosis not present

## 2019-12-25 DIAGNOSIS — N189 Chronic kidney disease, unspecified: Secondary | ICD-10-CM

## 2019-12-25 DIAGNOSIS — E1142 Type 2 diabetes mellitus with diabetic polyneuropathy: Secondary | ICD-10-CM

## 2019-12-25 DIAGNOSIS — N1832 Chronic kidney disease, stage 3b: Secondary | ICD-10-CM | POA: Diagnosis not present

## 2019-12-25 DIAGNOSIS — Z66 Do not resuscitate: Secondary | ICD-10-CM | POA: Diagnosis not present

## 2019-12-25 DIAGNOSIS — N183 Chronic kidney disease, stage 3 unspecified: Secondary | ICD-10-CM | POA: Diagnosis not present

## 2019-12-25 DIAGNOSIS — E785 Hyperlipidemia, unspecified: Secondary | ICD-10-CM | POA: Diagnosis not present

## 2019-12-25 DIAGNOSIS — C50911 Malignant neoplasm of unspecified site of right female breast: Secondary | ICD-10-CM | POA: Diagnosis not present

## 2019-12-25 DIAGNOSIS — Z170 Estrogen receptor positive status [ER+]: Secondary | ICD-10-CM | POA: Diagnosis not present

## 2019-12-25 DIAGNOSIS — E781 Pure hyperglyceridemia: Secondary | ICD-10-CM | POA: Diagnosis not present

## 2019-12-25 DIAGNOSIS — E1122 Type 2 diabetes mellitus with diabetic chronic kidney disease: Secondary | ICD-10-CM

## 2019-12-25 DIAGNOSIS — I130 Hypertensive heart and chronic kidney disease with heart failure and stage 1 through stage 4 chronic kidney disease, or unspecified chronic kidney disease: Secondary | ICD-10-CM | POA: Diagnosis not present

## 2019-12-25 DIAGNOSIS — I5032 Chronic diastolic (congestive) heart failure: Secondary | ICD-10-CM | POA: Diagnosis not present

## 2019-12-25 DIAGNOSIS — G4733 Obstructive sleep apnea (adult) (pediatric): Secondary | ICD-10-CM | POA: Diagnosis not present

## 2019-12-25 DIAGNOSIS — C50511 Malignant neoplasm of lower-outer quadrant of right female breast: Secondary | ICD-10-CM | POA: Diagnosis not present

## 2019-12-25 DIAGNOSIS — E1165 Type 2 diabetes mellitus with hyperglycemia: Secondary | ICD-10-CM | POA: Diagnosis not present

## 2019-12-25 LAB — BASIC METABOLIC PANEL
Anion gap: 10 (ref 5–15)
BUN: 62 mg/dL — ABNORMAL HIGH (ref 6–20)
CO2: 30 mmol/L (ref 22–32)
Calcium: 8.8 mg/dL — ABNORMAL LOW (ref 8.9–10.3)
Chloride: 95 mmol/L — ABNORMAL LOW (ref 98–111)
Creatinine, Ser: 1.6 mg/dL — ABNORMAL HIGH (ref 0.44–1.00)
GFR calc Af Amer: 43 mL/min — ABNORMAL LOW (ref >60)
GFR calc non Af Amer: 37 mL/min — ABNORMAL LOW (ref >60)
Glucose, Bld: 388 mg/dL — ABNORMAL HIGH (ref 70–99)
Potassium: 4.5 mmol/L (ref 3.5–5.1)
Sodium: 135 mmol/L (ref 135–145)

## 2019-12-25 LAB — GLUCOSE, CAPILLARY
Glucose-Capillary: 332 mg/dL — ABNORMAL HIGH (ref 70–99)
Glucose-Capillary: 367 mg/dL — ABNORMAL HIGH (ref 70–99)
Glucose-Capillary: 368 mg/dL — ABNORMAL HIGH (ref 70–99)
Glucose-Capillary: 403 mg/dL — ABNORMAL HIGH (ref 70–99)
Glucose-Capillary: 417 mg/dL — ABNORMAL HIGH (ref 70–99)
Glucose-Capillary: 457 mg/dL — ABNORMAL HIGH (ref 70–99)

## 2019-12-25 LAB — CBC
HCT: 36.5 % (ref 36.0–46.0)
Hemoglobin: 11.6 g/dL — ABNORMAL LOW (ref 12.0–15.0)
MCH: 27.2 pg (ref 26.0–34.0)
MCHC: 31.8 g/dL (ref 30.0–36.0)
MCV: 85.5 fL (ref 80.0–100.0)
Platelets: 185 10*3/uL (ref 150–400)
RBC: 4.27 MIL/uL (ref 3.87–5.11)
RDW: 17.1 % — ABNORMAL HIGH (ref 11.5–15.5)
WBC: 11 10*3/uL — ABNORMAL HIGH (ref 4.0–10.5)
nRBC: 0 % (ref 0.0–0.2)

## 2019-12-25 MED ORDER — INSULIN ASPART 100 UNIT/ML ~~LOC~~ SOLN
0.0000 [IU] | Freq: Three times a day (TID) | SUBCUTANEOUS | Status: DC
  Administered 2019-12-25: 20 [IU] via SUBCUTANEOUS
  Filled 2019-12-25: qty 1

## 2019-12-25 MED ORDER — INSULIN REGULAR HUMAN (CONC) 500 UNIT/ML ~~LOC~~ SOPN
30.0000 [IU] | PEN_INJECTOR | Freq: Two times a day (BID) | SUBCUTANEOUS | Status: DC
  Filled 2019-12-25: qty 3

## 2019-12-25 MED ORDER — OXYCODONE HCL 5 MG PO TABS: 5.0000 mg | ORAL_TABLET | Freq: Three times a day (TID) | ORAL | 0 refills | Status: AC | PRN

## 2019-12-25 MED ORDER — INSULIN REGULAR HUMAN (CONC) 500 UNIT/ML ~~LOC~~ SOPN
40.0000 [IU] | PEN_INJECTOR | Freq: Three times a day (TID) | SUBCUTANEOUS | Status: DC
  Administered 2019-12-25 (×2): 40 [IU] via SUBCUTANEOUS
  Filled 2019-12-25: qty 3

## 2019-12-25 NOTE — Plan of Care (Signed)
Patient states ready for discharge. Reviewed medication changes, JP care, and wound care with patient and husband who verbalized understanding.  Demonstrated JP care.  Oxygen brought from home for discharge. Reviewed diabetes education and the importance of keeping endocrinologist appointment.  Patient verbalized understanding and states has insulin at home. MD notified of blood sugar 417 and coverage given.  Patient eating spaghetti and drinking mountain dew.  Education given for nutrition and patient states needs those foods when stressed.  MD verified discharge patient with follow up appointment for endocrinologist.  PIV removed tip intact.

## 2019-12-25 NOTE — TOC Transition Note (Signed)
Transition of Care [facility]) - CM/SW Discharge Note   Patient Details  Name: [name] MRN: [id number] Date of Birth: [birth year]  [facility] CM/SW Contact:  Candie Chroman, LCSW Phone Number: 12/25/2019, 12:10 PM   Clinical Narrative: Patient has orders to discharge home today. [facility] representative is aware. Home health order is in including wound care and JP management instructions. No further concerns. CSW signing off.    Final next level of care: Home w Home Health Services Barriers to Discharge: Barriers Resolved   Patient Goals and CMS Choice   CMS Medicare.gov Compare Post Acute Care list provided to:: Patient (Spouse at bedside.) Choice offered to / list presented to : Patient  Discharge Placement                    Patient and family notified of of transfer: 12/25/19  Discharge Plan and Services                          HH Arranged: RN [facility] Agency: [facility] (formerly [employer]) Date [place]: 12/25/19   Representative spoke with at [place]: Drue Novel  Social Determinants of Health ([place]) Interventions     Readmission Risk Interventions No flowsheet data found.

## 2019-12-25 NOTE — Discharge Instructions (Signed)
° °Breast Cancer, Female ° °Breast cancer is a malignant growth of tissue (tumor) in the breast. Unlike noncancerous (benign) tumors, malignant tumors are cancerous and can spread to other parts of the body. The two most common types of breast cancer start in the milk ducts (ductal carcinoma) or in the lobules where milk is made in the breast (lobular carcinoma). Breast cancer is one of the most common types of cancer in women. °What are the causes? °The exact cause of female breast cancer is unknown. °What increases the risk? °The following factors may make you more likely to develop this condition: °· Being older than 51 years of age. °· Race and ethnicity. Caucasian women generally have an increased risk, but African-American women are more likely to develop the disease before age 45. °· Having a family history of breast cancer. °· Having had breast cancer in the past. °· Having certain noncancerous conditions of the breast, such as dense breast tissue. °· Having the BRCA1 and BRCA2 genes. °· Having a history of radiation exposure. °· Obesity. °· Starting menopause after age 55. °· Starting your menstrual periods before age 12. °· Having never been pregnant or having your first child after age 30. °· Having never breastfed. °· Using hormone therapy after menopause. °· Using birth control pills. °· Drinking more than one alcoholic drink a day. °· Exposure to the drug DES, which was given to pregnant women from the 1940s to the 1970s. °What are the signs or symptoms? °Symptoms of this condition include: °· A painless lump or thickening in your breast. °· Changes in the size or shape of your breast. °· Breast skin changes, such as puckering or dimpling. °· Nipple abnormalities, such as scaling, crustiness, redness, or pulling in (retraction). °· Nipple discharge that is bloody or clear. °How is this diagnosed? °This condition may be diagnosed by: °· Taking your medical history and doing a physical exam. During the  exam, your health care provider will feel the tissue around your breast and under your arms. °· Taking a sample of nipple discharge. The sample will be examined under a microscope. °· Performing imaging tests, such as breast X-rays (mammogram), breast ultrasound exams, or an MRI. °· Taking a tissue sample (biopsy) from the breast. The sample will be examined under a microscope to look for cancer cells. °· Taking a sample from the lymph nodes near the affected breast (sentinel node biopsy). °Your cancer will be staged to determine its severity and extent. Staging is a careful attempt to find out the size of the tumor, whether the cancer has spread, and if so, to what parts of the body. Staging also includes testing your tumor for certain receptors, such as estrogen, progesterone, and human epidermal growth factor receptor 2 (HER2). This will help your cancer care team decide on a treatment that will work best for you. You may need to have more tests to determine the stage of your cancer. Stages include the following: °· Stage 0--The tumor has not spread to other breast tissue. °· Stage I--The cancer is only found in the breast or may be in the lymph nodes. The tumor may be up to ¾ in (2 cm) wide. °· Stage II--The cancer has spread to nearby lymph nodes. The tumor may be up to 2 in (5 cm) wide. °· Stage III--The cancer has spread to more distant lymph nodes. The tumor may be larger than 2 in (5 cm) wide. °· Stage IV--The cancer has spread to other parts   of the body, such as the bones, brain, liver, or lungs. °How is this treated? °Treatment for this condition depends on the type and stage of the breast cancer. It may be treated with: °· Surgery. This may involve breast-conserving surgery (lumpectomy or partial mastectomy) in which only the part of the breast containing the cancer is removed. Some normal tissue surrounding this area may also be removed. In some cases, surgery may be done to remove the entire breast  (mastectomy) and nipple. Lymph nodes may also be removed. °· Radiation therapy, which uses high-energy rays to kill cancer cells. °· Chemotherapy, which is the use of drugs to kill cancer cells. °· Hormone therapy, which involves taking medicine to adjust the hormone levels in your body. You may take medicine to decrease your estrogen levels. This can help stop cancer cells from growing. °· Targeted therapy, in which drugs are used to block the growth and spread of cancer cells. These drugs target a specific part of the cancer cell and usually cause fewer side effects than chemotherapy. Targeted therapy may be used alone or in combination with chemotherapy. °· A combination of surgery, radiation, chemotherapy, or hormone therapy may be needed to treat breast cancer. °Follow these instructions at home: °· Take over-the-counter and prescription medicines only as told by your health care provider. °· Eat a healthy diet. A healthy diet includes lots of fruits and vegetables, low-fat dairy products, lean meats, and fiber. °? Make sure half your plate is filled with fruits or vegetables. °? Choose high-fiber foods such as whole-grain breads and cereals. °· Consider joining a support group. This may help you learn to cope with the stress of having breast cancer. °· Talk to your health care team about exercise and physical activity. The right exercise program can: °? Help prevent or reduce symptoms such as fatigue or depression. °? Improve overall health and survival rates. °· Keep all follow-up visits as told by your health care provider. This is important. °Where to find more information °· American Cancer Society: www.cancer.org °· National Cancer Institute: www.cancer.gov °Contact a health care provider if: °· You have a sudden increase in pain. °· You have any symptoms or changes that concern you. °· You lose weight without trying. °· You notice a new lump in either breast or under your arm. °· You develop swelling in  either arm or hand. °· You have a fever. °· You notice new fatigue or weakness. °Get help right away if: °· You have chest pain or trouble breathing. °· You faint. °Summary °· Breast cancer is a malignant growth of tissue (tumor) in the breast. °· Your cancer will be staged to determine its severity and extent. °· Treatment for this condition depends on the type and stage of the breast cancer. °This information is not intended to replace advice given to you by your health care provider. Make sure you discuss any questions you have with your health care provider. °Document Revised: 06/09/2017 Document Reviewed: 02/20/2017 °Elsevier Patient Education © 2020 [employer] °Sentinel Lymph Node Biopsy in Breast Cancer Treatment, Care After °This sheet gives you information about how to care for yourself after your procedure. Your health care provider may also give you more specific instructions. If you have problems or questions, contact your health care provider. °What can I expect after the procedure? °After the procedure, it is common to have: °· Blue urine and darker stool for the next 24 hours. This is normal. It is caused by the dye   dye that was used during the procedure.  Blue skin at the injection site. This may last for up to 8 weeks.  Numbness, tingling, or pain near your incision.  Swelling or bruising near your incision. Follow these instructions at home: Activity  Avoid activities that take a lot of effort.  Return to your normal activities as told by your health care provider. Ask your health care provider what activities are safe for you. Incision care   Follow instructions from your health care provider about how to take care of your incision. Make sure you: ? Wash your hands with soap and water for at least 20 seconds before and after you change your bandage (dressing). If soap and water are not available, use hand sanitizer. ? Change your dressing as told by your health care  provider. ? Leave stitches (sutures), skin glue, or adhesive strips in place. These skin closures may need to stay in place for 2 weeks or longer. If adhesive strip edges start to loosen and curl up, you may trim the loose edges. Do not remove adhesive strips completely unless your health care provider tells you to do that.  Do not take baths, swim, or use a hot tub until your health care provider approves. Ask your health care provider if you can take showers. You may be able to shower 24 hours after your procedure.  Check your biopsy site every day for signs of infection. Check for: ? More redness, swelling, or pain. ? Fluid or blood. ? Warmth. ? Pus or a bad smell. General instructions  If you were given a surgical bra, wear it for the next 48 hours. You may remove the bra to shower.  Take over-the-counter and prescription medicines only as told by your health care provider.  You may resume your regular diet.  Do not have your blood pressure taken or have blood drawn from the arm on the side of the biopsy until your health care provider says it is okay.  You may need to be screened for extra fluid around the lymph nodes and swelling in the breast and arm (lymphedema). Follow instructions from your health care provider about how often you should be checked.  Keep all follow-up visits as told by your health care provider. This is important. Contact a health care provider if you have:  Nausea and vomiting.  Any of these signs of infection: ? More redness, swelling, or pain around your biopsy site. ? Fluid or blood coming from your incision. ? Warmth coming from your incision area. ? Pus or a bad smell coming from your incision.  Any new bruising.  Chills or a fever. Get help right away if you have:  Pain that is getting worse and your medicine is not helping.  Vomiting that will not stop.  Chest pain or trouble breathing. Summary  After the procedure, it is common to have  blue urine and darker stool for the next 24 hours. This is normal. It is caused by the dye that was used during the procedure.  Follow instructions from your health care provider about how to take care of your incision.  Do not have your blood pressure taken or have blood drawn from the arm on the side of the biopsy until your health care provider says it is okay.  You may need to be screened for extra fluid around the lymph nodes and swelling in the breast and arm (lymphedema). Follow instructions from your health care provider about how  often you should be checked. This information is not intended to replace advice given to you by your health care provider. Make sure you discuss any questions you have with your health care provider. Document Revised: 02/11/2019 Document Reviewed: 02/11/2019 Elsevier Patient Education  [place].

## 2019-12-25 NOTE — Progress Notes (Signed)
Inpatient Diabetes Program Recommendations  AACE/ADA: New Consensus Statement on Inpatient Glycemic Control   Target Ranges:  Prepandial:   less than 140 mg/dL      Peak postprandial:   less than 180 mg/dL (1-2 hours)      Critically ill patients:  140 - 180 mg/dL  Results for [name], [name] (MRN [id number]) as of 12/25/2019 08:07  Ref. Range 12/25/2019 00:50 12/25/2019 00:53 12/25/2019 04:31 12/25/2019 07:59  Glucose-Capillary Latest Ref Range: 70 - 99 mg/dL 403 (H) 368 (H) 367 (H) 332 (H)   Results for [name], [name] (MRN [id number]) as of 12/25/2019 08:07  Ref. Range 12/24/2019 07:47 12/24/2019 10:12 12/24/2019 11:52 12/24/2019 13:03 12/24/2019 15:53 12/24/2019 16:57 12/24/2019 19:37  Glucose-Capillary Latest Ref Range: 70 - 99 mg/dL 521 (HH)   U500 80 units 429 (H) 360 (H) 371 (H) 340 (H) 280 (H)  U500 30 units 390 (H)   Review of Glycemic Control  Diabetes history:DM2 Outpatient Diabetes medications:Humulin R U500 0-80 units BID (if CBG in 200's or less takes none, if CBG in 300's takes 30 units, if CBG over 500 mg/dl takes 80 units), Xigduo XR 5-500 mg daily Current orders for Inpatient glycemic control: Humulin R U500 30-80 units BID   Inpatient Diabetes Program Recommendations:    Insulin-Please consider changing Humulin R U500 to 40 units TID (to be given at 8am, 12 pm, 5 pm) and adding Novolog 0-20 units AC&HS.  Thanks, Barnie Alderman, RN, MSN, CDE Diabetes Coordinator Inpatient Diabetes Program [phone] (Team Pager from 8am to 5pm)

## 2019-12-25 NOTE — Progress Notes (Signed)
[facility] Day(s): 0.   Post op day(s): 1 Day Post-Op.   Interval History:  Patient seen and examined no acute events or new complaints overnight.  Patient reports she is ready to go home No pain, fever, chills, nausea, emesis  CBC with stable Hgb to 11.6 Reactive leukocytosis improved to 11K; no fevers Renal function improved, sCr - 1.6 (was 2.2 yesterday) Hyperglycemia slowly improving, 388 this morning  Drain output slowed significantly, only 160 ccs in last 24 hours Tolerating diet Mobilizing  Vital signs in last 24 hours: [min-max] current  Temp:  [96.7 F (35.9 C)-97.6 F (36.4 C)] 97.5 F (36.4 C) (06/16 0503) Pulse Rate:  [73-80] 80 (06/16 0503) Resp:  [15-18] 18 (06/16 0503) BP: (114-133)/(51-84) 133/84 (06/16 0503) SpO2:  [96 %-100 %] 97 % (06/16 0503) Weight:  [127.9 kg] 127.9 kg (06/15 1311)     Height: 5\' 4"  (162.6 cm) Weight: 127.9 kg BMI (Calculated): 48.38   Intake/Output last 2 shifts:  06/15 0701 - 06/16 0700 In: 657.8 [P.O.:480; I.V.:177.8] Out: 3417 [Urine:3250; Drains:167]   Physical Exam:  Constitutional: alert, cooperative and no distress  Respiratory: breathing non-labored at rest  Cardiovascular: regular rate and sinus rhythm  Integumentary: Right beast incision is CDI with staples, no significant ecchymosis, no erythema or drainage, the JP drain does appear more serosanguinous fluid and has slowed   Labs:  CBC Latest Ref Rng & Units 12/25/2019 12/24/2019 12/24/2019  WBC 4.0 - 10.5 K/uL 11.0(H) - 15.0(H)  Hemoglobin 12.0 - 15.0 g/dL 11.6(L) 12.2 12.5  Hematocrit 36 - 46 % 36.5 37.9 38.3  Platelets 150 - 400 K/uL 185 - 202   CMP Latest Ref Rng & Units 12/25/2019 12/24/2019 12/16/2019  Glucose 70 - 99 mg/dL 388(H) 494(H) 183(H)  BUN 6 - 20 mg/dL 62(H) 70(H) 60(H)  Creatinine 0.44 - 1.00 mg/dL 1.60(H) 2.21(H) 1.93(H)  Sodium 135 - 145 mmol/L 135 130(L) 134(L)  Potassium 3.5 - 5.1 mmol/L 4.5 4.3 3.8   Chloride 98 - 111 mmol/L 95(L) 88(L) 89(L)  CO2 22 - 32 mmol/L 30 27 31   Calcium 8.9 - 10.3 mg/dL 8.8(L) 8.7(L) 9.2  Total Protein 6.5 - 8.1 g/dL - - 8.8(H)  Total Bilirubin 0.3 - 1.2 mg/dL - - 1.2  Alkaline Phos 38 - 126 U/L - - 114  AST 15 - 41 U/L - - 27  ALT 0 - 44 U/L - - 26     Imaging studies: No new pertinent imaging studies   Assessment/Plan:  49 y.o. female 1 Day Post-Op s/p right simple mastectomy and SLNB for invasive mammary carcinoma of the right breast, complicated by multiple pertinent comorbidities including uncontrolled DM, AKI, hyponatremia.   - Okay for carb modified/diabetic diet  - No re-intervention at this time, drainage slowed and more serosanguinous  - Pain control prn; antiemetics prn             - Monitor abdominal examination  - Monitor and record drain output; she will need to go home with this             - Continue breast binder              - Appreciate medicine assistance with comorbid conditions   - Continue to hold DVT prophylaxis as precaution   - Discharge Planning: Okay for discharge from surgical standpoint, will need drain and dressing teaching for home, continue breast binder, follow up with general surgery in 1 week  All of the above findings and recommendations were discussed with the patient, and the medical team, and all of patient's questions were answered to her expressed satisfaction.  -- Edison Simon, PA-C [facility] 12/25/2019, 7:24 AM [phone] M-F: 7am - 4pm

## 2019-12-25 NOTE — TOC Initial Note (Addendum)
Transition of Care [facility]) - Initial/Assessment Note    Patient Details  Name: [name] MRN: [id number] Date of Birth: 18-Apr-1971  Transition of Care [facility]) CM/SW Contact:    Candie Chroman, LCSW Phone Number: 12/25/2019, 10:36 AM  Clinical Narrative: CSW met with patient. Husband at bedside. CSW introduced role and explained that discharge planning would be discussed. Patient and husband agreeable to home health nurse for JP drain and wound care management. Patient said she already has a Marine scientist through [place] ([place]) that comes to her home once per week, Orlie Pollen. CSW left Olivia Mackie a Advertising account executive. Will see if she can manage her needs or if we need to involve an outside home health agency. If Olivia Mackie unable to manage JP and wound care, [facility] is first preference home health agency. Patient has worked with them in the past. Patient has oxygen at home through [place]. She also has a bedside commode, rollator, cane, wheelchair, and shower chair at home. No further concerns. CSW encouraged patient and husband to contact CSW as needed. CSW will continue to follow patient for support and facilitate return home when stable.    11:37 am: Received call back from [facility]. Patient is only getting outpatient palliative services through them so she would recommend a home health nurse for JP/wound care management. CSW called and made referral to [facility]. Awaiting decision.       11:48 am: [facility] is able to accept patient for nursing. Start of care will be in 1-2 days. Patient is aware and agreeable. Sent secure chat to MD to notify and asked that JP/wound care instructions be entered in home health order.  Expected Discharge Plan: [place] Barriers to Discharge: Continued Medical Work up   Patient Goals and CMS Choice   CMS Medicare.gov Compare Post Acute Care list provided to:: Patient (Spouse at bedside.)    Expected Discharge Plan and  Services Expected Discharge Plan: [place]       Living arrangements for the past 2 months: Single Family Home                                      Prior Living Arrangements/Services Living arrangements for the past 2 months: Single Family Home Lives with:: Spouse Patient language and need for interpreter reviewed:: Yes Do you feel safe going back to the place where you live?: Yes      Need for Family Participation in Patient Care: Yes (Comment) Care giver support system in place?: Yes (comment) Current home services: DME, Hospice Criminal Activity/Legal Involvement Pertinent to Current Situation/Hospitalization: No - Comment as needed  Activities of Daily Living Home Assistive Devices/Equipment: Cane (specify quad or straight), Shower chair with back, Walker (specify type) ADL Screening (condition at time of admission) Patient's cognitive ability adequate to safely complete daily activities?: Yes Is the patient deaf or have difficulty hearing?: No Does the patient have difficulty seeing, even when wearing glasses/contacts?: No Does the patient have difficulty concentrating, remembering, or making decisions?: No Patient able to express need for assistance with ADLs?: Yes Does the patient have difficulty dressing or bathing?: Yes Independently performs ADLs?: No Communication: Independent Dressing (OT): Needs assistance Is this a change from baseline?: Pre-admission baseline Grooming: Needs assistance Is this a change from baseline?: Pre-admission baseline Feeding: Independent Bathing: Needs assistance Is this a change from  baseline?: Pre-admission baseline Toileting: Needs assistance Is this a change from baseline?: Pre-admission baseline In/Out Bed: Needs assistance Is this a change from baseline?: Pre-admission baseline Walks in Home: Needs assistance Is this a change from baseline?: Pre-admission baseline Does the patient have difficulty  walking or climbing stairs?: Yes Weakness of Legs: Both Weakness of Arms/Hands: None  Permission Sought/Granted Permission sought to share information with : Facility Sport and exercise psychologist, Family Supports Permission granted to share information with : Yes, Verbal Permission Granted  Share Information with NAME: Amberlin Utke  Permission granted to share info w AGENCY: [facility] granted to share info w Relationship: Spouse  Permission granted to share info w Contact Information: [phone]  Emotional Assessment Appearance:: Appears stated age Attitude/Demeanor/Rapport: Engaged, Gracious Affect (typically observed): Accepting, Appropriate, Calm, Pleasant Orientation: : Oriented to Self, Oriented to Place, Oriented to  Time, Oriented to Situation Alcohol / Substance Use: Not Applicable Psych Involvement: No (comment)  Admission diagnosis:  Breast cancer, right breast [facility]) [C50.911] Patient Active Problem List   Diagnosis Date Noted  . Acute kidney injury superimposed on CKD ([place])   . Breast cancer, right breast ([place]) 12/23/2019  . Hyperlipidemia   . Carcinoma of lower-outer quadrant of right breast in female, estrogen receptor positive ([place]) 11/12/2019  . Restrictive lung disease 10/08/2019  . Breast mass 10/07/2019  . Postmenopausal bleeding 08/30/2019  . Carotid stenosis, asymptomatic, right 08/16/2019  . Nausea and vomiting 07/15/2019  . Cellulitis of left lower extremity 06/04/2019  . Multiple wounds of skin 06/04/2019  . Other injury of unspecified body region, initial encounter 06/04/2019  . Left-sided weakness 09/13/2018  . Weakness of left lower extremity 09/05/2018  . Recurrent falls 09/05/2018  . PAD (peripheral artery disease) ([place]) 08/27/2018  . Chronic congestive heart failure ([place]) 08/02/2018  . Vaginal itching 06/15/2018  . Foot pain, bilateral 05/22/2018  . Chronic upper extremity pain (Secondary Area of Pain)  (Bilateral) 04/23/2018  . Diabetic polyneuropathy associated with type 2 diabetes mellitus ([place]) 04/23/2018  . Long term prescription benzodiazepine use 04/23/2018  . Neurogenic pain 04/23/2018  . Vitamin D insufficiency 01/29/2018  . Elevated C-reactive protein (CRP) 01/29/2018  . Elevated sedimentation rate 01/29/2018  . Pressure injury of skin 01/29/2018  . Poorly controlled type 2 diabetes mellitus ([place]) 01/23/2018  . DNR (do not resuscitate) 01/23/2018  . CKD stage 3 due to type 2 diabetes mellitus ([place]) 01/23/2018  . IBS (irritable bowel syndrome) 01/19/2018  . Elevated serum hCG 01/19/2018  . Chronic lower extremity pain (Primary Area of Pain) (Bilateral) 01/08/2018  . Chronic low back pain [facility] Area of Pain) (Bilateral) w/ sciatica (Bilateral) 01/08/2018  . Chronic pain syndrome 01/08/2018  . Pharmacologic therapy 01/08/2018  . Disorder of skeletal system 01/08/2018  . Problems influencing health status 01/08/2018  . Long term current use of opiate analgesic 01/08/2018  . Restless leg syndrome 08/02/2017  . Nocturnal hypoxemia 03/29/2017  . Vision disturbance 02/28/2017  . CKD (chronic kidney disease), stage II 02/28/2017  . Visual loss, bilateral   . Chronic respiratory failure with hypoxia (HCC)/ nocturnal 02 dep  10/28/2016  . Upper airway cough syndrome 08/22/2016  . Cigarette smoker 06/15/2016  . Simple chronic bronchitis ([place])   . Coronary artery disease involving native heart without angina pectoris 05/16/2016  . Chronic diastolic CHF (congestive heart failure) ([place]) 11/04/2015  . Orthopnea 11/03/2015  . Bilateral leg edema   . Diabetes ([place])   . COPD exacerbation ([place]) 10/15/2015  .  Fracture of rib, closed 10/15/2015  . Venous stasis of both lower extremities 03/29/2015  . Preventative health care 02/04/2015  . Essential hypertension 01/02/2015  . Inguinal hernia 11/27/2014  . Allergic rhinitis with postnasal drip 01/21/2014  . Aortic valve disorders  04/18/2013  . Sleep apnea 05/22/2012  . Seizure disorder ([place]) 10/09/2011  . Bipolar disorder ([place]) 10/09/2011  . TIA (transient ischemic attack) 06/22/2011  . Constipation 06/15/2011  . HYPERTRIGLYCERIDEMIA 07/17/2007  . Situational anxiety 05/30/2007  . End stage COPD ([place]) 05/30/2007  . Morbid (severe) obesity due to excess calories ([place]) 04/23/2007  . Tobacco abuse 09/07/2006   PCP:  Pleas Koch, NP Pharmacy:   [facility], [place] [address], [place] [address] Phone: [phone] Fax: [phone]  [place], [place] - [place] [address] Phone: [phone] Fax: [phone]  [id number] [address] [id number]: [phone]: [phone]    Social Determinants of Health (SDOH) Interventions    Readmission Risk Interventions No flowsheet data found.

## 2019-12-25 NOTE — Discharge Summary (Addendum)
Physician Discharge Summary  [name] TZG:[id number] DOB: 04-29-1971 DOA: 12/23/2019  PCP: Pleas Koch, NP  Admit date: 12/23/2019 Discharge date: 12/25/2019  Admitted From: Home Disposition:  Home with home health  Recommendations for Outpatient Follow-up:  1. Follow up with PCP in 1-2 weeks 2. Follow with Dr. Luther Bradley in 1 week 3. Home health for drain care and dressing changes  Home Health:Yes Equipment/Devices:Yes, JP drain Discharge Condition:Stable CODE STATUS:FULL Diet recommendation: Heart Healthy / Carb Modified Brief/Interim Summary: [name]  is a 49 y.o. female with a known history of diabetes on U500 insulin.  Patient seen in the PACU after undergoing right mastectomy by Dr. Christian Mate.  Patient complains of 20 out of 10 pain in her right chest.  No complaints of shortness of breath.  She chronically wears 2.5 L of oxygen.  No complaints of shortness of breath.  She is a smoker 1 pack/day.  Clarifying what she does take for her diabetes she is takes 30 units twice a day for her sugars being in the 300s and 80 units twice a day if her sugars are greater than 500.  She does not take her insulin if her sugars are in the 200s because she does not like the way she feels if her sugars are in the 100s.  6/16: Patient seen and examined the day of discharge.  Sugars remain poorly controlled.  Discussed with diabetes coordinator.  Recommending you 540 units 3 times daily with insulin sliding scale.  This regimen was initiated patient continues to have hypoglycemia.  Had a long discussion with the patient and the husband at bedside.  She will see her endocrinologist within 1 to 2 weeks of discharge to discuss further insulin regimen titration.  Patient is highly insulin resistant and requires very large doses of long-acting concentrated insulin.  Also discussed with general surgery, cleared for discharge from their standpoint.  Patient will follow up with Dr. Lutricia Feil in the  clinic in 1 week.  She will discharge with JP drain in place.  Home health orders for drain care and wound management.  Discharge Diagnoses:  Active Problems:   End stage COPD (HCC)   Chronic diastolic CHF (congestive heart failure) (HCC)   CKD stage 3 due to type 2 diabetes mellitus (HCC)   Diabetic polyneuropathy associated with type 2 diabetes mellitus (HCC)   Breast cancer, right breast (HCC)   Hyperlipidemia   Acute kidney injury superimposed on CKD ([place])  Type 2 diabetes mellitus with hyperglycemia, poorly controlled, insulin-dependent Sugars are very difficult to control in the hospital.  Patient was switched to her home U5 100 with sliding scale insulin.  Slightly improved control with above regimen.  Long discussion with patient at time of discharge.  She will see her endocrinologist within 1 week of discharge to discuss further insulin regimen titration.  Can resume her home regimen on discharge  Postoperative bleeding General surgery evaluated large amount of bloody output from drain.  This output did improve spontaneously so return to the OR was held  Acute kidney injury on chronic kidney disease stage IIIa Kidney function likely secondary to medication administration versus prerenal azotemia.  Can resume home regimen on discharge.  Follow-up outpatient PCP  Chronic hypoxic respiratory failure secondary to COPD Resume home oxygen regimen  Diabetic neuropathy On Lyrica  Chronic diastolic congestive heart failure No signs of decompensation.  Can resume home regimen on discharge  Breast cancer Status post right mastectomy  Tobacco abuse Counseled patient on cessation  Anxiety Depression Bipolar disorder Continue home psychiatric regimen  Hyperlipidemia, unspecified Resume home Crestor   Discharge Instructions  Discharge Instructions    Diet - low sodium heart healthy   Complete by: As directed    Discharge wound care:   Complete by: As directed    Dressing  changes with fluffed gauze daily and prn, continue breast binder daily, avoid showering while drain in place. She should empty and record drain output daily   Increase activity slowly   Complete by: As directed      Allergies as of 12/25/2019      Reactions   Adhesive [tape] Rash, Other (See Comments)   TAKES OFF THE SKIN (CERTAIN MEDICAL TAPES DO THIS!!)   Metoprolol Shortness Of Breath   Occurrence of shortness of breath after 3 days   Montelukast Shortness Of Breath   Morphine Sulfate Anaphylaxis, Shortness Of Breath, Nausea And Vomiting   Swollen Throat - Able to tolerate dilaudid   Penicillins Anaphylaxis, Hives, Shortness Of Breath   Throat swells Has patient had a PCN reaction causing immediate rash, facial/tongue/throat swelling, SOB or lightheadedness with hypotension: Yes Has patient had a PCN reaction causing severe rash involving mucus membranes or skin necrosis: No Has patient had a PCN reaction that required hospitalization: Yes Has patient had a PCN reaction occurring within the last 10 years: No If all of the above answers are "NO", then may proceed with Cephalosporin use.   Prednisone Anaphylaxis   Diltiazem Swelling   Gabapentin Swelling   Midodrine    Lightheaded and falling down      Medication List    STOP taking these medications   doxycycline 100 MG tablet Commonly known as: ADOXA   Potassium Chloride ER 20 MEQ Tbcr     TAKE these medications   acetaminophen 500 MG tablet Commonly known as: TYLENOL Take 1,000 mg by mouth every 6 (six) hours as needed for moderate pain.   albuterol 108 (90 Base) MCG/ACT inhaler Commonly known as: VENTOLIN HFA Inhale 2 puffs into the lungs every 6 (six) hours as needed for wheezing or shortness of breath.   ALPRAZolam 1 MG tablet Commonly known as: XANAX Take 1 mg by mouth 4 (four) times daily as needed for anxiety.   aspirin EC 81 MG tablet Take 81 mg by mouth daily before breakfast.   budesonide-formoterol  80-4.5 MCG/ACT inhaler Commonly known as: Symbicort INHALE 2 PUFFS BY MOUTH EVERY 12 HOURS TO PREVENT COUGH OR WHEEZING *RINSE MOUTH AFTER EACH USE* What changed:   how much to take  how to take this  when to take this  additional instructions   busPIRone 30 MG tablet Commonly known as: BUSPAR Take 30 mg by mouth 2 (two) times daily.   citalopram 20 MG tablet Commonly known as: CELEXA Take 1 tablet (20 mg total) by mouth daily.   Dapagliflozin-metFORMIN HCl ER 5-500 MG Tb24 Take 1 tablet by mouth daily with breakfast.   famotidine 20 MG tablet Commonly known as: PEPCID TAKE 1 TABLET (20 MG TOTAL) BY MOUTH 2 (TWO) TIMES DAILY. FOR HEARTBURN. What changed: See the new instructions.   FLUoxetine 20 MG tablet Commonly known as: PROZAC Take 20 mg by mouth daily.   HUMULIN R 500 UNIT/ML injection Generic drug: insulin regular human CONCENTRATED Inject 60-80 Units into the skin 2 (two) times daily before a meal. (based upon blood glucose reading)   Insulin Pen Needle 32G X 4 MM Misc Used to give insulin injections twice daily.  UltiCare Mini Pen Needles 31G X 6 MM Misc Generic drug: Insulin Pen Needle USE AS DIRECTED THREE TIMES DAILY   INSULIN SYRINGE 1CC/30GX1/2" 30G X 1/2" 1 ML Misc 1 each by Does not apply route 2 (two) times daily. E11.9   ipratropium-albuterol 0.5-2.5 (3) MG/3ML Soln Commonly known as: DUONEB Take 3 mLs by nebulization every 4 (four) hours as needed (wheezing/shortness of breath).   metolazone 5 MG tablet Commonly known as: ZAROXOLYN Take 5 mg by mouth daily.   nicotine 21 mg/24hr patch Commonly known as: NICODERM CQ - dosed in mg/24 hours Place 1 patch (21 mg total) onto the skin daily.   ondansetron 4 MG tablet Commonly known as: ZOFRAN Take 4 mg by mouth daily as needed for nausea.   OneTouch Delica Lancets 14H Misc 1 each by Does not apply route 3 (three) times daily. Use to monitor glucose levels three times daily; E11.9; E10.42    OneTouch Verio test strip Generic drug: glucose blood 1 each by Other route daily. E11.9; E10.42   oxyCODONE 5 MG immediate release tablet Commonly known as: Oxy IR/ROXICODONE Take 1 tablet (5 mg total) by mouth every 8 (eight) hours as needed for up to 5 days for moderate pain.   OXYGEN Inhale 2-4 L into the lungs at bedtime.   pregabalin 300 MG capsule Commonly known as: LYRICA Take 1 capsule (300 mg total) by mouth 2 (two) times daily. For neuropathy.   rOPINIRole 1 MG tablet Commonly known as: REQUIP TAKE 1 TABLET BY MOUTH EVERY NIGHT AT BEDTIME FOR RESTLESS LEGS. What changed: See the new instructions.   rosuvastatin 10 MG tablet Commonly known as: CRESTOR Take 1 tablet (10 mg total) by mouth daily. What changed: when to take this   spironolactone 50 MG tablet Commonly known as: ALDACTONE TAKE 1 TABLET BY MOUTH ONCE A DAY   tamoxifen 20 MG tablet Commonly known as: NOLVADEX Take 1 tablet (20 mg total) by mouth daily.   torsemide 20 MG tablet Commonly known as: DEMADEX TAKE 1 TABLET BY MOUTH 4 TIMES DAILY What changed: See the new instructions.   traZODone 100 MG tablet Commonly known as: DESYREL Take 200 mg by mouth at bedtime.            Discharge Care Instructions  (From admission, onward)         Start     Ordered   12/25/19 0000  Discharge wound care:       Comments: Dressing changes with fluffed gauze daily and prn, continue breast binder daily, avoid showering while drain in place. She should empty and record drain output daily   12/25/19 1201          Follow-up Information    Home, [facility] Follow up.   Specialty: Home Health Services Why: They will follow up with you in 1-2 days for your home health nurse needs. Contact information: [address] [place] [address] [phone]        Ronny Bacon, MD. Schedule an appointment as soon as possible for a visit in 1 week(s).   Specialty: General Surgery Contact  information: [facility] [address] [phone]              Allergies  Allergen Reactions  . Adhesive [Tape] Rash and Other (See Comments)    TAKES OFF THE SKIN (CERTAIN MEDICAL TAPES DO THIS!!)  . Metoprolol Shortness Of Breath    Occurrence of shortness of breath  after 3 days  . Montelukast Shortness Of Breath  . Morphine Sulfate Anaphylaxis, Shortness Of Breath and Nausea And Vomiting    Swollen Throat - Able to tolerate dilaudid  . Penicillins Anaphylaxis, Hives and Shortness Of Breath    Throat swells Has patient had a PCN reaction causing immediate rash, facial/tongue/throat swelling, SOB or lightheadedness with hypotension: Yes Has patient had a PCN reaction causing severe rash involving mucus membranes or skin necrosis: No Has patient had a PCN reaction that required hospitalization: Yes Has patient had a PCN reaction occurring within the last 10 years: No If all of the above answers are "NO", then may proceed with Cephalosporin use.   . Prednisone Anaphylaxis  . Diltiazem Swelling  . Gabapentin Swelling  . Midodrine     Lightheaded and falling down    Consultations:  General surgery   Procedures/Studies: DG Chest 2 View  Result Date: 12/16/2019 CLINICAL DATA:  Weakness EXAM: CHEST - 2 VIEW COMPARISON:  11/27/2019 FINDINGS: Lungs are clear.  No pleural effusion or pneumothorax. The heart is normal in size. Mild degenerative changes of the visualized thoracolumbar spine. IMPRESSION: Normal chest radiographs. Electronically Signed   By: Julian Hy M.D.   On: 12/16/2019 11:46   DG Chest 2 View  Result Date: 11/27/2019 CLINICAL DATA:  Shortness of breath, mid chest pain for 2 weeks, right breast cancer EXAM: CHEST - 2 VIEW COMPARISON:  09/23/2019 FINDINGS: Frontal and lateral views of the chest demonstrate an unremarkable cardiac silhouette. No airspace disease, effusion, or pneumothorax. Eventration right hemidiaphragm again  noted. No acute bony abnormalities. IMPRESSION: 1. No acute intrathoracic process. Electronically Signed   By: Randa Ngo M.D.   On: 11/27/2019 22:44   CT Head Wo Contrast  Result Date: 12/16/2019 CLINICAL DATA:  Left-sided weakness EXAM: CT HEAD WITHOUT CONTRAST TECHNIQUE: Contiguous axial images were obtained from the base of the skull through the vertex without intravenous contrast. COMPARISON:  09/23/2019 FINDINGS: Brain: No evidence of acute infarction, hemorrhage, hydrocephalus, extra-axial collection or mass lesion/mass effect. Vascular: No hyperdense vessel or unexpected calcification. Skull: Normal. Negative for fracture or focal lesion. Sinuses/Orbits: No acute finding. Other: Stable BB is noted within the left parietal scalp. IMPRESSION: No acute intracranial abnormality is noted. Electronically Signed   By: Inez Catalina M.D.   On: 12/16/2019 10:57   NM SENTINEL NODE INJECTION  Result Date: 12/23/2019 CLINICAL DATA:  Right breast cancer. EXAM: NUCLEAR MEDICINE BREAST LYMPHOSCINTIGRAPHY TECHNIQUE: Intradermal injection of radiopharmaceutical was performed at the 12 o'clock, 3 o'clock, 6 o'clock, and 9 o'clock positions around the right nipple. The patient was then sent to the operating room where the sentinel node(s) were identified and removed by the surgeon. RADIOPHARMACEUTICALS:  Total of 1 mCi Millipore-filtered Technetium-89m sulfur colloid, injected in four aliquots of 0.25 mCi each. IMPRESSION: Uncomplicated intradermal injection of a total of 1 mCi Technetium-17m sulfur colloid for purposes of sentinel node identification. Electronically Signed   By: Sandi Mariscal M.D.   On: 12/23/2019 09:13   (Echo, Carotid, EGD, Colonoscopy, ERCP)    Subjective: Seen and examined the day of discharge.  No complaints.  Husband at bedside.  Stable for discharge home Discharge Exam: Vitals:   12/25/19 0503 12/25/19 1241  BP: 133/84 105/64  Pulse: 80 83  Resp: 18 15  Temp: (!) 97.5 F (36.4 C)  97.6 F (36.4 C)  SpO2: 97% 100%   Vitals:   12/24/19 1653 12/24/19 1932 12/25/19 0503 12/25/19 1241  BP: 121/64 (!) 118/59 133/84  105/64  Pulse: 74 78 80 83  Resp: 16 16 18 15   Temp: 97.6 F (36.4 C) (!) 97.2 F (36.2 C) (!) 97.5 F (36.4 C) 97.6 F (36.4 C)  TempSrc:  Oral  Oral  SpO2: 96% 97% 97% 100%  Weight:      Height:        General: Pt is alert, awake, not in acute distress Cardiovascular: RRR, S1/S2 +, no rubs, no gallops Respiratory: CTA bilaterally, no wheezing, no rhonchi Abdominal: Soft, NT, ND, bowel sounds + Extremities: no edema, no cyanosis    The results of significant diagnostics from this hospitalization (including imaging, microbiology, ancillary and laboratory) are listed below for reference.     Microbiology: Recent Results (from the past 240 hour(s))  SARS CORONAVIRUS 2 (TAT 6-24 HRS) Nasopharyngeal Nasopharyngeal Swab     Status: None   Collection Time: 12/20/19  9:34 AM   Specimen: Nasopharyngeal Swab  Result Value Ref Range Status   SARS Coronavirus 2 NEGATIVE NEGATIVE Final    Comment: (NOTE) SARS-CoV-2 target nucleic acids are NOT DETECTED.  The SARS-CoV-2 RNA is generally detectable in upper and lower respiratory specimens during the acute phase of infection. Negative results do not preclude SARS-CoV-2 infection, do not rule out co-infections with other pathogens, and should not be used as the sole basis for treatment or other patient management decisions. Negative results must be combined with clinical observations, patient history, and epidemiological information. The expected result is Negative.  Fact Sheet for Patients: SugarRoll.be  Fact Sheet for Healthcare Providers: https://www.woods-mathews.com/  This test is not yet approved or cleared by the [place] FDA and  has been authorized for detection and/or diagnosis of SARS-CoV-2 by FDA under an Emergency Use Authorization (EUA).  This EUA will remain  in effect (meaning this test can be used) for the duration of the COVID-19 declaration under Se ction 564(b)(1) of the Act, 21 U.S.C. section 360bbb-3(b)(1), unless the authorization is terminated or revoked sooner.  Performed at [facility], [place] [address], [place], [place] [address]      Labs: BNP (last 3 results) Recent Labs    11/08/19 1022 11/27/19 2214  BNP 59.0 33.2   Basic Metabolic Panel: Recent Labs  Lab 12/24/19 0531 12/25/19 0433  NA 130* 135  K 4.3 4.5  CL 88* 95*  CO2 27 30  GLUCOSE 494* 388*  BUN 70* 62*  CREATININE 2.21* 1.60*  CALCIUM 8.7* 8.8*   Liver Function Tests: No results for input(s): AST, ALT, ALKPHOS, BILITOT, PROT, ALBUMIN in the last 168 hours. No results for input(s): LIPASE, AMYLASE in the last 168 hours. No results for input(s): AMMONIA in the last 168 hours. CBC: Recent Labs  Lab 12/24/19 0531 12/24/19 1735 12/25/19 0433  WBC 15.0*  --  11.0*  HGB 12.5 12.2 11.6*  HCT 38.3 37.9 36.5  MCV 82.9  --  85.5  PLT 202  --  185   Cardiac Enzymes: No results for input(s): CKTOTAL, CKMB, CKMBINDEX, TROPONINI in the last 168 hours. BNP: Invalid input(s): POCBNP CBG: Recent Labs  Lab 12/25/19 0053 12/25/19 0431 12/25/19 0759 12/25/19 1222 12/25/19 1345  GLUCAP 368* 367* 332* 417* 457*   D-Dimer No results for input(s): DDIMER in the last 72 hours. Hgb A1c No results for input(s): HGBA1C in the last 72 hours. Lipid Profile No results for input(s): CHOL, HDL, LDLCALC, TRIG, CHOLHDL, LDLDIRECT in the last 72 hours. Thyroid function studies No results for input(s): TSH, T4TOTAL, T3FREE, THYROIDAB  in the last 72 hours.  Invalid input(s): FREET3 Anemia work up No results for input(s): VITAMINB12, FOLATE, FERRITIN, TIBC, IRON, RETICCTPCT in the last 72 hours. Urinalysis    Component Value Date/Time   COLORURINE YELLOW (A) 12/16/2019 1114   APPEARANCEUR CLEAR (A) 12/16/2019 1114   LABSPEC 1.006  12/16/2019 1114   PHURINE 5.0 12/16/2019 1114   GLUCOSEU >=500 (A) 12/16/2019 1114   HGBUR SMALL (A) 12/16/2019 1114   HGBUR negative 03/07/2008 1643   BILIRUBINUR NEGATIVE 12/16/2019 1114   BILIRUBINUR Negative 07/26/2019 1333   KETONESUR NEGATIVE 12/16/2019 1114   PROTEINUR NEGATIVE 12/16/2019 1114   UROBILINOGEN 0.2 07/26/2019 1333   UROBILINOGEN 0.2 10/08/2011 1850   NITRITE NEGATIVE 12/16/2019 1114   LEUKOCYTESUR NEGATIVE 12/16/2019 1114   Sepsis Labs Invalid input(s): PROCALCITONIN,  WBC,  LACTICIDVEN Microbiology Recent Results (from the past 240 hour(s))  SARS CORONAVIRUS 2 (TAT 6-24 HRS) Nasopharyngeal Nasopharyngeal Swab     Status: None   Collection Time: 12/20/19  9:34 AM   Specimen: Nasopharyngeal Swab  Result Value Ref Range Status   SARS Coronavirus 2 NEGATIVE NEGATIVE Final    Comment: (NOTE) SARS-CoV-2 target nucleic acids are NOT DETECTED.  The SARS-CoV-2 RNA is generally detectable in upper and lower respiratory specimens during the acute phase of infection. Negative results do not preclude SARS-CoV-2 infection, do not rule out co-infections with other pathogens, and should not be used as the sole basis for treatment or other patient management decisions. Negative results must be combined with clinical observations, patient history, and epidemiological information. The expected result is Negative.  Fact Sheet for Patients: SugarRoll.be  Fact Sheet for Healthcare Providers: https://www.woods-mathews.com/  This test is not yet approved or cleared by the [place] FDA and  has been authorized for detection and/or diagnosis of SARS-CoV-2 by FDA under an Emergency Use Authorization (EUA). This EUA will remain  in effect (meaning this test can be used) for the duration of the COVID-19 declaration under Se ction 564(b)(1) of the Act, 21 U.S.C. section 360bbb-3(b)(1), unless the authorization is terminated  or revoked sooner.  Performed at [facility], [place] [address] [place],  [address]      Time coordinating discharge: Over 30 minutes  SIGNED:   Sidney Ace, MD  [facility] 12/25/2019, 2:25 PM Pager   If 7PM-7AM, please contact night-coverage

## 2019-12-27 ENCOUNTER — Other Ambulatory Visit: Payer: Self-pay | Admitting: Anatomic Pathology & Clinical Pathology

## 2019-12-27 DIAGNOSIS — Z483 Aftercare following surgery for neoplasm: Secondary | ICD-10-CM | POA: Diagnosis not present

## 2019-12-27 DIAGNOSIS — I5032 Chronic diastolic (congestive) heart failure: Secondary | ICD-10-CM | POA: Diagnosis not present

## 2019-12-27 DIAGNOSIS — N183 Chronic kidney disease, stage 3 unspecified: Secondary | ICD-10-CM | POA: Diagnosis not present

## 2019-12-27 DIAGNOSIS — I4510 Unspecified right bundle-branch block: Secondary | ICD-10-CM | POA: Diagnosis not present

## 2019-12-27 DIAGNOSIS — J9611 Chronic respiratory failure with hypoxia: Secondary | ICD-10-CM | POA: Diagnosis not present

## 2019-12-27 DIAGNOSIS — Z170 Estrogen receptor positive status [ER+]: Secondary | ICD-10-CM | POA: Diagnosis not present

## 2019-12-27 DIAGNOSIS — F418 Other specified anxiety disorders: Secondary | ICD-10-CM | POA: Diagnosis not present

## 2019-12-27 DIAGNOSIS — F319 Bipolar disorder, unspecified: Secondary | ICD-10-CM | POA: Diagnosis not present

## 2019-12-27 DIAGNOSIS — E1122 Type 2 diabetes mellitus with diabetic chronic kidney disease: Secondary | ICD-10-CM | POA: Diagnosis not present

## 2019-12-27 DIAGNOSIS — E1151 Type 2 diabetes mellitus with diabetic peripheral angiopathy without gangrene: Secondary | ICD-10-CM | POA: Diagnosis not present

## 2019-12-27 DIAGNOSIS — I2510 Atherosclerotic heart disease of native coronary artery without angina pectoris: Secondary | ICD-10-CM | POA: Diagnosis not present

## 2019-12-27 DIAGNOSIS — E1142 Type 2 diabetes mellitus with diabetic polyneuropathy: Secondary | ICD-10-CM | POA: Diagnosis not present

## 2019-12-27 DIAGNOSIS — I110 Hypertensive heart disease with heart failure: Secondary | ICD-10-CM | POA: Diagnosis not present

## 2019-12-27 DIAGNOSIS — E1165 Type 2 diabetes mellitus with hyperglycemia: Secondary | ICD-10-CM | POA: Diagnosis not present

## 2019-12-27 DIAGNOSIS — C50511 Malignant neoplasm of lower-outer quadrant of right female breast: Secondary | ICD-10-CM | POA: Diagnosis not present

## 2019-12-27 DIAGNOSIS — J449 Chronic obstructive pulmonary disease, unspecified: Secondary | ICD-10-CM | POA: Diagnosis not present

## 2019-12-27 LAB — SURGICAL PATHOLOGY

## 2019-12-27 NOTE — Telephone Encounter (Signed)
Spoken to patient's husband. Received a message from [place] that we will be needing a face to face with patient for the wheelchair. I was able to schedule a virtual on Tuesday 12/31/2019

## 2019-12-31 ENCOUNTER — Telehealth (INDEPENDENT_AMBULATORY_CARE_PROVIDER_SITE_OTHER): Payer: BC Managed Care – PPO | Admitting: Primary Care

## 2019-12-31 DIAGNOSIS — Z170 Estrogen receptor positive status [ER+]: Secondary | ICD-10-CM

## 2019-12-31 DIAGNOSIS — E1165 Type 2 diabetes mellitus with hyperglycemia: Secondary | ICD-10-CM

## 2019-12-31 DIAGNOSIS — J9611 Chronic respiratory failure with hypoxia: Secondary | ICD-10-CM

## 2019-12-31 DIAGNOSIS — C50511 Malignant neoplasm of lower-outer quadrant of right female breast: Secondary | ICD-10-CM | POA: Diagnosis not present

## 2019-12-31 DIAGNOSIS — R296 Repeated falls: Secondary | ICD-10-CM | POA: Diagnosis not present

## 2019-12-31 DIAGNOSIS — F319 Bipolar disorder, unspecified: Secondary | ICD-10-CM

## 2019-12-31 DIAGNOSIS — I509 Heart failure, unspecified: Secondary | ICD-10-CM | POA: Diagnosis not present

## 2019-12-31 NOTE — Assessment & Plan Note (Signed)
Now following with new endocrinologist and is doing better. Compliant to regimen.

## 2019-12-31 NOTE — Assessment & Plan Note (Signed)
Needing wheelchair for doctors appointments and travel. Will approve given history of chronic respiratory failure and CHF. Rx written and will be faxed off with office notes.

## 2019-12-31 NOTE — Telephone Encounter (Signed)
Faxed the demographics, insurance, Rx for wheelchair, and progress notes to [place]

## 2019-12-31 NOTE — Assessment & Plan Note (Signed)
No recent weight gain, feeling well. Overall appears euvolemic on video visit. Continue to monitor.

## 2019-12-31 NOTE — Assessment & Plan Note (Signed)
Status post right mastectomy on 12/23/19. Compliant to tamoxifen.  Following with oncology.

## 2019-12-31 NOTE — Progress Notes (Signed)
Subjective:    Patient ID: [name], female    DOB: Jun 05, 1971, 49 y.o.   MRN: [id number]  HPI  Virtual Visit via Video Note  I connected with [name] on 12/31/19 at  9:20 AM EDT by a video enabled telemedicine application and verified that I am speaking with the correct person using two identifiers.  Location: Patient: Home Provider: Office    I discussed the limitations of evaluation and management by telemedicine and the availability of in person appointments. The patient expressed understanding and agreed to proceed.  History of Present Illness:  [name] is a 49 year old female with a significant medical history including CHF, breast cancer, uncontrolled diabetes, tobacco abuse, COPD, chronic respiratory failure, bipolar disorder, sleep apnea, CKD, chronic back pain, seizure disorder who presents today for face to face visit for wheelchair.   She is needing a wheelchair that can hold up to 300 pounds and that is big enough to hold her oxygen container. She is following with home health who is requesting a printed prescription and face to face visit.   She is requesting a wheelchair to travel to and from doctors appointments due to exertional dyspnea. She cannot walk long distances due to her medical conditions.  She has established with a new endocrinologist, Dr. Honor Junes, and is feeling much better managed. She is tracking her glucose readings and is seeing better results. She is compliant to her prescribed regimen.  She is s/p right mastectectomy on 12/23/19, doing well since, she is compliant to Tamoxifen. Following with oncology.  She was prescribed citalopram 20 mg by oncology to replace fluoxetine, she's not sure why and has been taking fluoxetine only. She's not been taking citalopram as she prefers to stay on fluoxetine.  She is monitoring her weights daily, denies weight gains, chest pain, increased shortness of breath.    Observations/Objective:  Alert  and oriented. Appears well, not sickly. No distress. Speaking in complete sentences.  Assessment and Plan:  See problem based charting.  Follow Up Instructions:  We will fax off the office notes for your wheelchair.  Notify your oncologist that you are not taking citalopram. Do not take both citalopram and fluoxetine.   It was a pleasure to see you today! Nancy Bossier, NP-C    I discussed the assessment and treatment plan with the patient. The patient was provided an opportunity to ask questions and all were answered. The patient agreed with the plan and demonstrated an understanding of the instructions.   The patient was advised to call back or seek an in-person evaluation if the symptoms worsen or if the condition fails to improve as anticipated.    Pleas Koch, NP    Review of Systems  Constitutional: Negative for fever.  Eyes: Negative for visual disturbance.  Respiratory: Negative for cough.   Cardiovascular: Negative for chest pain.  Neurological: Negative for dizziness and headaches.       Past Medical History:  Diagnosis Date  . Anxiety    takes Prozac daily  . Anxiety   . Aortic valve calcification   . Asthma    Advair and Spirva daily  . Asthma   . Bipolar disorder ([place])   . CAD (coronary artery disease)    a. LHC 11/2013 done for CP/fluid retention: mild disease in prox LAD, mild-mod disease in mRCA, EF 60% with normal LVEDP. b. Normal nuc 03/2016.  Marland Kitchen CHF (congestive heart failure) ([place])   . Chronic diastolic CHF (congestive heart  failure) ([place])   . Chronic heart failure with preserved ejection fraction ([place]) 11/16/2018  . CKD (chronic kidney disease), stage II   . COPD (chronic obstructive pulmonary disease) (HCC)    a. nocturnal O2.  Marland Kitchen COPD (chronic obstructive pulmonary disease) ([place])   . Coronary artery disease   . Decreased urine stream   . Diabetes mellitus   . Diabetes mellitus without complication ([place])   . Dyspnea   . Family history of  adverse reaction to anesthesia    mom gets nauseated  . GERD (gastroesophageal reflux disease)    takes Pepcid daily  . History of blood clots    left leg 3-84yrs ago  . Hyperlipidemia   . Hypertension   . Hypertriglyceridemia   . Inguinal hernia, left 01/2015  . Muscle spasm   . Open wound of genital labia   . Peripheral neuropathy   . RBBB   . Seizures ([place])   . Sepsis ([place]) 01/19/2018  . Sinus tachycardia    a. persistent since 2009.  Marland Kitchen Smokers' cough ([place])   . Stroke [facility]) 1989   left sided weakness  . TIA (transient ischemic attack)   . Tobacco abuse   . Vulvar abscess 01/23/2018     Social History   Socioeconomic History  . Marital status: Married    Spouse name: Not on file  . Number of children: 2  . Years of education: Not on file  . Highest education level: Not on file  Occupational History  . Not on file  Tobacco Use  . Smoking status: Current Every Day Smoker    Packs/day: 1.00    Years: 36.00    Pack years: 36.00    Types: Cigarettes    Start date: 07/11/1981  . Smokeless tobacco: Never Used  . Tobacco comment: 1 ppd now  Vaping Use  . Vaping Use: Former  Substance and Sexual Activity  . Alcohol use: No  . Drug use: No  . Sexual activity: Not Currently  Other Topics Concern  . Not on file  Social History Narrative   ** Merged History Encounter **       Lives in [place]   Married but lives with Boyfriend - not legally separated   Disabled - for BiPolar, Seizure disorder, diabetes   Formerly worked at [employer] 1 ppd; no alcohol. 2 step sons; no biologic children.       Social Determinants of Health   Financial Resource Strain:   . Difficulty of Paying Living Expenses:   Food Insecurity:   . Worried About Charity fundraiser in the Last Year:   . Arboriculturist in the Last Year:   Transportation Needs:   . Film/video editor (Medical):   Marland Kitchen Lack of Transportation (Non-Medical):   Physical Activity:   . Days of Exercise per  Week:   . Minutes of Exercise per Session:   Stress:   . Feeling of Stress :   Social Connections:   . Frequency of Communication with Friends and Family:   . Frequency of Social Gatherings with Friends and Family:   . Attends Religious Services:   . Active Member of Clubs or Organizations:   . Attends Archivist Meetings:   Marland Kitchen Marital Status:   Intimate Partner Violence:   . Fear of Current or Ex-Partner:   . Emotionally Abused:   Marland Kitchen Physically Abused:   . Sexually Abused:     Past  Surgical History:  Procedure Laterality Date  . BREAST BIOPSY Right 11/06/2019   [place] core path pending venus clip  . HERNIA REPAIR    . INCISION AND DRAINAGE ABSCESS N/A 01/26/2018   Procedure: INCISION AND DEBRIDEMENT OF VULVAR NECROTIZING SOFT TISSUE INFECTION;  Surgeon: Greer Pickerel, MD;  Location: [place];  Service: General;  Laterality: N/A;  . INCISION AND DRAINAGE PERIRECTAL ABSCESS N/A 01/22/2018   Procedure: IRRIGATION AND DEBRIDEMENT LABIAL/VULVAR AREA;  Surgeon: Coralie Keens, MD;  Location: [place];  Service: General;  Laterality: N/A;  . INCISION AND DRAINAGE PERIRECTAL ABSCESS N/A 01/29/2018   Procedure: IRRIGATION AND DEBRIDEMENT VULVA;  Surgeon: Excell Seltzer, MD;  Location: [place];  Service: General;  Laterality: N/A;  . INGUINAL HERNIA REPAIR Left 04/08/2015   Procedure: OPEN LEFT INGUINAL HERNIA REPAIR WITH MESH;  Surgeon: Ralene Ok, MD;  Location: [place];  Service: General;  Laterality: Left;  . INSERTION OF MESH Left 04/08/2015   Procedure: INSERTION OF MESH;  Surgeon: Ralene Ok, MD;  Location: [place];  Service: General;  Laterality: Left;  . LAPAROSCOPY     Endometriosis  . LEFT HEART CATHETERIZATION WITH CORONARY ANGIOGRAM N/A 12/05/2013   Procedure: LEFT HEART CATHETERIZATION WITH CORONARY ANGIOGRAM;  Surgeon: Jettie Booze, MD;  Location: [facility] CATH LAB;  Service: Cardiovascular;  Laterality: N/A;  . right kidney drained    . SIMPLE MASTECTOMY WITH AXILLARY  SENTINEL NODE BIOPSY Right 12/23/2019   Procedure: Right SIMPLE MASTECTOMY WITH AXILLARY SENTINEL NODE BIOPSY;  Surgeon: Ronny Bacon, MD;  Location: [facility];  Service: General;  Laterality: Right;  . TEE WITHOUT CARDIOVERSION N/A 11/28/2013   Procedure: TRANSESOPHAGEAL ECHOCARDIOGRAM (TEE);  Surgeon: Thayer Headings, MD;  Location[facility] ENDOSCOPY;  Service: Cardiovascular;  Laterality: N/A;    Family History  Problem Relation Age of Onset  . Venous thrombosis Brother   . Other Brother        BRAIN TUMOR  . Asthma Father   . Diabetes Father   . Coronary artery disease Mother   . Hypertension Mother   . Diabetes Mother   . Breast cancer Mother 67  . Asthma Sister   . Diabetes type II Brother     Allergies  Allergen Reactions  . Adhesive [Tape] Rash and Other (See Comments)    TAKES OFF THE SKIN (CERTAIN MEDICAL TAPES DO THIS!!)  . Metoprolol Shortness Of Breath    Occurrence of shortness of breath after 3 days  . Montelukast Shortness Of Breath  . Morphine Sulfate Anaphylaxis, Shortness Of Breath and Nausea And Vomiting    Swollen Throat - Able to tolerate dilaudid  . Penicillins Anaphylaxis, Hives and Shortness Of Breath    Throat swells Has patient had a PCN reaction causing immediate rash, facial/tongue/throat swelling, SOB or lightheadedness with hypotension: Yes Has patient had a PCN reaction causing severe rash involving mucus membranes or skin necrosis: No Has patient had a PCN reaction that required hospitalization: Yes Has patient had a PCN reaction occurring within the last 10 years: No If all of the above answers are "NO", then may proceed with Cephalosporin use.   . Prednisone Anaphylaxis  . Diltiazem Swelling  . Gabapentin Swelling  . Midodrine     Lightheaded and falling down    Current Outpatient Medications on File Prior to Visit  Medication Sig Dispense Refill  . acetaminophen (TYLENOL) 500 MG tablet Take 1,000 mg by mouth every 6 (six) hours as needed  for moderate pain.     Marland Kitchen  albuterol (VENTOLIN HFA) 108 (90 Base) MCG/ACT inhaler Inhale 2 puffs into the lungs every 6 (six) hours as needed for wheezing or shortness of breath.    . ALPRAZolam (XANAX) 1 MG tablet Take 1 mg by mouth 4 (four) times daily as needed for anxiety.     Marland Kitchen aspirin EC 81 MG tablet Take 81 mg by mouth daily before breakfast.     . budesonide-formoterol (SYMBICORT) 80-4.5 MCG/ACT inhaler INHALE 2 PUFFS BY MOUTH EVERY 12 HOURS TO PREVENT COUGH OR WHEEZING *RINSE MOUTH AFTER EACH USE* (Patient taking differently: Inhale 2 puffs into the lungs in the morning and at bedtime. ) 10.2 g 3  . busPIRone (BUSPAR) 30 MG tablet Take 30 mg by mouth 2 (two) times daily.    . citalopram (CELEXA) 20 MG tablet Take 1 tablet (20 mg total) by mouth daily. 30 tablet 2  . Dapagliflozin-metFORMIN HCl ER 5-500 MG TB24 Take 1 tablet by mouth daily with breakfast.    . famotidine (PEPCID) 20 MG tablet TAKE 1 TABLET (20 MG TOTAL) BY MOUTH 2 (TWO) TIMES DAILY. FOR HEARTBURN. (Patient taking differently: Take 20 mg by mouth 2 (two) times daily. ) 180 tablet 1  . FLUoxetine (PROZAC) 20 MG tablet Take 20 mg by mouth daily.    Marland Kitchen glucose blood (ONETOUCH VERIO) test strip 1 each by Other route daily. E11.9; E10.42 100 each 0  . Insulin Pen Needle 32G X 4 MM MISC Used to give insulin injections twice daily. 100 each 11  . insulin regular human CONCENTRATED (HUMULIN R) 500 UNIT/ML injection Inject 60-80 Units into the skin 2 (two) times daily before a meal. (based upon blood glucose reading)    . Insulin Syringe-Needle U-100 (INSULIN SYRINGE 1CC/30GX1/2") 30G X 1/2" 1 ML MISC 1 each by Does not apply route 2 (two) times daily. E11.9 200 each 0  . ipratropium-albuterol (DUONEB) 0.5-2.5 (3) MG/3ML SOLN Take 3 mLs by nebulization every 4 (four) hours as needed (wheezing/shortness of breath). 360 mL 0  . metolazone (ZAROXOLYN) 5 MG tablet Take 5 mg by mouth daily.     . nicotine (NICODERM CQ - DOSED IN MG/24 HOURS)  21 mg/24hr patch Place 1 patch (21 mg total) onto the skin daily. 28 patch 0  . ondansetron (ZOFRAN) 4 MG tablet Take 4 mg by mouth daily as needed for nausea.     Glory Rosebush Delica Lancets 53I MISC 1 each by Does not apply route 3 (three) times daily. Use to monitor glucose levels three times daily; E11.9; E10.42 100 each 11  . OXYGEN Inhale 2-4 L into the lungs at bedtime.     . pregabalin (LYRICA) 300 MG capsule Take 1 capsule (300 mg total) by mouth 2 (two) times daily. For neuropathy. 180 capsule 0  . rOPINIRole (REQUIP) 1 MG tablet TAKE 1 TABLET BY MOUTH EVERY NIGHT AT BEDTIME FOR RESTLESS LEGS. (Patient taking differently: Take 1 mg by mouth at bedtime. ) 90 tablet 1  . rosuvastatin (CRESTOR) 10 MG tablet Take 1 tablet (10 mg total) by mouth daily. (Patient taking differently: Take 10 mg by mouth at bedtime. ) 90 tablet 3  . spironolactone (ALDACTONE) 50 MG tablet TAKE 1 TABLET BY MOUTH ONCE A DAY (Patient taking differently: Take 50 mg by mouth daily. ) 90 tablet 1  . tamoxifen (NOLVADEX) 20 MG tablet Take 1 tablet (20 mg total) by mouth daily. 30 tablet 1  . torsemide (DEMADEX) 20 MG tablet TAKE 1 TABLET BY MOUTH 4 TIMES  DAILY (Patient taking differently: Take 20 mg by mouth 2 (two) times daily. ) 360 tablet 3  . traZODone (DESYREL) 100 MG tablet Take 200 mg by mouth at bedtime.     Marland Kitchen ULTICARE MINI PEN NEEDLES 31G X 6 MM MISC USE AS DIRECTED THREE TIMES DAILY 100 each 0   No current facility-administered medications on file prior to visit.    There were no vitals taken for this visit.   Objective:   Physical Exam  Constitutional: She is oriented to person, place, and time.  Respiratory: Effort normal.  GI: Normal appearance.  Neurological: She is alert and oriented to person, place, and time.  Psychiatric: Mood normal.           Assessment & Plan:

## 2019-12-31 NOTE — Assessment & Plan Note (Signed)
Approved request for wheelchair.  Will fax notes to home health.

## 2019-12-31 NOTE — Assessment & Plan Note (Signed)
Following with psychiatry. Recently prescribed citalopram per oncology. Discussed not to take both fluoxetine and citalopram, she seemed confused about this.   She prefers fluoxetine and will discuss this with oncology.

## 2020-01-02 ENCOUNTER — Encounter: Payer: Self-pay | Admitting: Surgery

## 2020-01-02 ENCOUNTER — Other Ambulatory Visit: Payer: Self-pay

## 2020-01-02 ENCOUNTER — Ambulatory Visit (INDEPENDENT_AMBULATORY_CARE_PROVIDER_SITE_OTHER): Payer: Self-pay | Admitting: Surgery

## 2020-01-02 VITALS — BP 129/78 | HR 82 | Temp 97.7°F | Resp 12 | Ht 64.0 in | Wt 279.0 lb

## 2020-01-02 DIAGNOSIS — Z9011 Acquired absence of right breast and nipple: Secondary | ICD-10-CM

## 2020-01-02 MED ORDER — OXYCODONE HCL 5 MG PO TABS: 5.0000 mg | ORAL_TABLET | Freq: Three times a day (TID) | ORAL | 0 refills | Status: DC | PRN

## 2020-01-02 NOTE — Progress Notes (Signed)
[facility] POST-OP OFFICE VISIT  01/02/2020  HPI: [name] is a 49 y.o. female 10 days s/p right simple mastectomy with sentinel lymph node biopsy Postoperative course was complicated by her hyperglycemia, hematoma formation under her skin flaps.  She presents now with complaints of drain pain.  She reports her sugars have been better controlled in the 100s/lower 200s.  She still having a significant amount of drain output of serosanguineous, old blood appearance.  She denies any pain of her chest wall aside from when she is attempting to utilize her walker.  No fevers or chills.  Vital signs: BP 129/78   Pulse 82   Temp 97.7 F (36.5 C) (Oral)   Resp 12   Ht 5\' 4"  (1.626 m)   Wt 279 lb (126.6 kg)   SpO2 96%   BMI 47.89 kg/m    Physical Exam: Constitutional: Appears at baseline.  Nontoxic. Right chest wall, her staple line is somewhat elevated due to the underlying hematoma.  There is sufficient inflammatory reaction around all the staples and a longer incision to ensure that we can remove her staples safely.  They were removed with application of benzoin and Steri-Strips.  This was well-tolerated.  We milked her drain to ensure adequate function.  She would like some pain medications because of continuance of her drain. Skin: There is a very small serous bleb about mid flap centrally.  Assessment/Plan: This is a 49 y.o. female 10 days s/p simple mastectomy, right with sentinel lymph node biopsy.  Patient Active Problem List   Diagnosis Date Noted  . Acute kidney injury superimposed on CKD ([place])   . Breast cancer, right breast ([place]) 12/23/2019  . Hyperlipidemia   . Carcinoma of lower-outer quadrant of right breast in female, estrogen receptor positive ([place]) 11/12/2019  . Restrictive lung disease 10/08/2019  . Breast mass 10/07/2019  . Postmenopausal bleeding 08/30/2019  . Carotid stenosis, asymptomatic, right 08/16/2019  . Nausea and vomiting 07/15/2019   . Cellulitis of left lower extremity 06/04/2019  . Multiple wounds of skin 06/04/2019  . Other injury of unspecified body region, initial encounter 06/04/2019  . Left-sided weakness 09/13/2018  . Weakness of left lower extremity 09/05/2018  . Recurrent falls 09/05/2018  . PAD (peripheral artery disease) ([place]) 08/27/2018  . Chronic congestive heart failure ([place]) 08/02/2018  . Vaginal itching 06/15/2018  . Foot pain, bilateral 05/22/2018  . Chronic upper extremity pain (Secondary Area of Pain) (Bilateral) 04/23/2018  . Diabetic polyneuropathy associated with type 2 diabetes mellitus ([place]) 04/23/2018  . Long term prescription benzodiazepine use 04/23/2018  . Neurogenic pain 04/23/2018  . Vitamin D insufficiency 01/29/2018  . Elevated C-reactive protein (CRP) 01/29/2018  . Elevated sedimentation rate 01/29/2018  . Pressure injury of skin 01/29/2018  . Poorly controlled type 2 diabetes mellitus ([employer]) 01/23/2018  . DNR (do not resuscitate) 01/23/2018  . CKD stage 3 due to type 2 diabetes mellitus ([place]) 01/23/2018  . IBS (irritable bowel syndrome) 01/19/2018  . Elevated serum hCG 01/19/2018  . Chronic lower extremity pain (Primary Area of Pain) (Bilateral) 01/08/2018  . Chronic low back pain [facility] Area of Pain) (Bilateral) w/ sciatica (Bilateral) 01/08/2018  . Chronic pain syndrome 01/08/2018  . Pharmacologic therapy 01/08/2018  . Disorder of skeletal system 01/08/2018  . Problems influencing health status 01/08/2018  . Long term current use of opiate analgesic 01/08/2018  . Restless leg syndrome 08/02/2017  . Nocturnal hypoxemia 03/29/2017  . Vision disturbance 02/28/2017  . CKD (chronic kidney disease),  stage II 02/28/2017  . Visual loss, bilateral   . Chronic respiratory failure with hypoxia (HCC)/ nocturnal 02 dep  10/28/2016  . Upper airway cough syndrome 08/22/2016  . Cigarette smoker 06/15/2016  . Simple chronic bronchitis ([place])   . Coronary artery disease involving  native heart without angina pectoris 05/16/2016  . Chronic diastolic CHF (congestive heart failure) ([place]) 11/04/2015  . Orthopnea 11/03/2015  . Bilateral leg edema   . Diabetes ([place])   . COPD exacerbation ([place]) 10/15/2015  . Fracture of rib, closed 10/15/2015  . Venous stasis of both lower extremities 03/29/2015  . Preventative health care 02/04/2015  . Essential hypertension 01/02/2015  . Inguinal hernia 11/27/2014  . Allergic rhinitis with postnasal drip 01/21/2014  . Aortic valve disorders 04/18/2013  . Sleep apnea 05/22/2012  . Seizure disorder ([place]) 10/09/2011  . Bipolar disorder ([place]) 10/09/2011  . TIA (transient ischemic attack) 06/22/2011  . Constipation 06/15/2011  . HYPERTRIGLYCERIDEMIA 07/17/2007  . Situational anxiety 05/30/2007  . End stage COPD ([place]) 05/30/2007  . Morbid (severe) obesity due to excess calories ([place]) 04/23/2007  . Tobacco abuse 09/07/2006    -We will continue the JP drain for now.  Encouraged them to continue cleansing around the drain site with peroxide on a twice daily basis.  Keep an eye out for potential infection development.  They will follow up next week for drain maintenance during my absence. I will refill a prescription for Roxicet to help her with the pain adjacent to her drain.   Ronny Bacon M.D., FACS 01/02/2020, 2:44 PM

## 2020-01-02 NOTE — Patient Instructions (Addendum)
We removed all staples. We placed steri strips. These will begin to fall off within 7-10days.  Please continue recording the drain output daily. Cleanse around the drain twice a day with hydrogen peroxide and place a dressing around the drain.

## 2020-01-03 ENCOUNTER — Telehealth: Payer: Self-pay

## 2020-01-03 NOTE — Telephone Encounter (Signed)
MP patient. Please cancel Carotid schedule in future

## 2020-01-06 DIAGNOSIS — J9610 Chronic respiratory failure, unspecified whether with hypoxia or hypercapnia: Secondary | ICD-10-CM | POA: Diagnosis not present

## 2020-01-07 ENCOUNTER — Inpatient Hospital Stay: Payer: BC Managed Care – PPO | Admitting: Internal Medicine

## 2020-01-07 ENCOUNTER — Inpatient Hospital Stay: Payer: BC Managed Care – PPO

## 2020-01-07 DIAGNOSIS — J449 Chronic obstructive pulmonary disease, unspecified: Secondary | ICD-10-CM | POA: Diagnosis not present

## 2020-01-07 NOTE — Assessment & Plan Note (Deleted)
#  Early stage breast cancer clinical stage I-II [T2 N0] ER/PR positive HER-2 negative; surgery curently on hold sec to medical co-morbidties/ see below. Ok to start Tamoxifen "neo-adjuvant"-discussed the rationale of starting tamoxifen.  Discussed the mechanism of action potential side effects include but not limited to hot flashes joint pains etc.  # COPD exacerbation/symptomatic hold off steroids.  No signs of active infection.- 88 on RA; improved on 93 on 3 lits. Recommend nebs q 4 hours; follow up PCP/pulmonary.  Continue Advair albuterol.  Chest x-ray-December 16, 2019 reviewed; no acute process noted.  No concern for PE.  #Poorly controlled diabetes-on insulin; followed endocrinology.  #Peripheral neuropathy grade 2-3; patient goes on with a walker/cane at baseline.  Continue Lyrica.  Stable  #Stage III kidney disease-second diabetes-overall stable.  DISPOSITION: # follow up in 3 weeks; MDL labs- cbc/[id number];cea; ca-15-3- Dr.B  

## 2020-01-07 NOTE — Progress Notes (Deleted)
one [place] NOTE  Patient Care Team: Pleas Koch, NP as PCP - General (Internal Medicine) Rosamaria Lints, MD (Inactive) as Referring Physician (Neurology) Alvester Chou, NP (Nurse Practitioner) Renato Shin, MD as Consulting Physician (Endocrinology) Theodore Demark, RN as Oncology Nurse Navigator Ronny Bacon, MD as Consulting Physician (General Surgery)  CHIEF COMPLAINTS/PURPOSE OF CONSULTATION: Breast cancer  #  Oncology History Overview Note  #April 2021-2.5 cm right breast mass [incidental-CT scan chest]  # April 2021- RIGHT BREAST [facility]; s/p ER- 51-90%; PR- 51-90%; Her- 2-NEG. G-2. [Dr.Rodenberg]  # DM- poorly controlled; Seizure disorder/Bipolar/TIA ; Diabetes PN- G-3 [cane/walker; falls]; COPD; OSA active smoker  # SURVIVORSHIP:   # GENETICS:   DIAGNOSIS:   STAGE:         ;  GOALS:  CURRENT/MOST RECENT THERAPY :      Carcinoma of lower-outer quadrant of right breast in female, estrogen receptor positive ([name])  11/12/2019 Initial Diagnosis   Carcinoma of lower-outer quadrant of right breast in female, estrogen receptor positive ([name])      HISTORY OF PRESENTING ILLNESS:  [name] 49 y.o.  female  with multiple medical problems include history of TIA, bipolar disorder morbid obesity poorly controlled diabetes with neuropathy-recently diagnosed breast cancer stage II ER/PR positive HER-2 negative.  Patient was evaluated by surgery; however surgery had to be on hold because of patient's comorbidities-poorly controlled diabetes; COPD etc.  Patient was evaluated in the emergency room yesterday for shortness of breath fatigue/confusion.-Patient had appropriate work-up without any causes.  Patient continues to have worsening fatigue.  Worsening cough.  Worsening shortness of breath.  She is currently on oxygen only at nighttime.  At 2 L.  Review of Systems  Constitutional: Negative for chills, diaphoresis, fever, malaise/fatigue and  weight loss.  HENT: Negative for nosebleeds and sore throat.   Eyes: Negative for double vision.  Respiratory: Positive for cough and shortness of breath. Negative for hemoptysis, sputum production and wheezing.   Cardiovascular: Negative for chest pain, palpitations, orthopnea and leg swelling.  Gastrointestinal: Negative for abdominal pain, blood in stool, constipation, diarrhea, heartburn, melena, nausea and vomiting.  Genitourinary: Negative for dysuria, frequency and urgency.  Musculoskeletal: Positive for back pain and joint pain.  Skin: Negative.  Negative for itching and rash.  Neurological: Positive for tingling. Negative for dizziness, focal weakness, weakness and headaches.  Endo/Heme/Allergies: Does not bruise/bleed easily.  Psychiatric/Behavioral: Negative for depression. The patient is nervous/anxious and has insomnia.      MEDICAL HISTORY:  Past Medical History:  Diagnosis Date  . Anxiety    takes Prozac daily  . Anxiety   . Aortic valve calcification   . Asthma    Advair and Spirva daily  . Asthma   . Bipolar disorder ([place])   . CAD (coronary artery disease)    a. LHC 11/2013 done for CP/fluid retention: mild disease in prox LAD, mild-mod disease in mRCA, EF 60% with normal LVEDP. b. Normal nuc 03/2016.  Marland Kitchen CHF (congestive heart failure) ([place])   . Chronic diastolic CHF (congestive heart failure) ([place])   . Chronic heart failure with preserved ejection fraction ([place]) 11/16/2018  . CKD (chronic kidney disease), stage II   . COPD (chronic obstructive pulmonary disease) (H[facility])    a. nocturnal O2.  Marland Kitchen COPD (chronic obstructive pulmonary disease) ([place])   . Coronary artery disease   . Decreased urine stream   . Diabetes mellitus   . Diabetes mellitus without complication ([place])   . Dyspnea   .  Family history of adverse reaction to anesthesia    mom gets nauseated  . GERD (gastroesophageal reflux disease)    takes Pepcid daily  . History of blood clots    left leg 3-70yr ago   . Hyperlipidemia   . Hypertension   . Hypertriglyceridemia   . Inguinal hernia, left 01/2015  . Muscle spasm   . Open wound of genital labia   . Peripheral neuropathy   . RBBB   . Seizures ([place]   . Sepsis ([place] 01/19/2018  . Sinus tachycardia    a. persistent since 2009.  .Marland KitchenSmokers' cough ([place]   . Stroke ([facility] 1989   left sided weakness  . TIA (transient ischemic attack)   . Tobacco abuse   . Vulvar abscess 01/23/2018    SURGICAL HISTORY: Past Surgical History:  Procedure Laterality Date  . BREAST BIOPSY Right 11/06/2019   uKoreacore path pending venus clip  . HERNIA REPAIR    . INCISION AND DRAINAGE ABSCESS N/A 01/26/2018   Procedure: INCISION AND DEBRIDEMENT OF VULVAR NECROTIZING SOFT TISSUE INFECTION;  Surgeon: WGreer Pickerel MD;  [facility]: [facility]  Service: General;  Laterality: N/A;  . INCISION AND DRAINAGE PERIRECTAL ABSCESS N/A 01/22/2018   Procedure: IRRIGATION AND DEBRIDEMENT LABIAL/VULVAR AREA;  Surgeon: BCoralie Keens MD;  [place]: [facility]  Service: General;  Laterality: N/A;  . INCISION AND DRAINAGE PERIRECTAL ABSCESS N/A 01/29/2018   Procedure: IRRIGATION AND DEBRIDEMENT VULVA;  Surgeon: HExcell Seltzer MD;  [facility]  Service: General;  Laterality: N/A;  . INGUINAL HERNIA REPAIR Left 04/08/2015   Procedure: OPEN LEFT INGUINAL HERNIA REPAIR WITH MESH;  Surgeon: ARalene Ok MD;  Location: [facility]  Service: General;  Laterality: Left;  . INSERTION OF MESH Left 04/08/2015   Procedure: INSERTION OF MESH;  Surgeon: ARalene Ok MD;  Location: [facility]  Service: General;  Laterality: Left;  . LAPAROSCOPY     Endometriosis  . LEFT HEART CATHETERIZATION WITH CORONARY ANGIOGRAM N/A 12/05/2013   Procedure: LEFT HEART CATHETERIZATION WITH CORONARY ANGIOGRAM;  Surgeon: JJettie Booze MD;  [facility];  Service: Cardiovascular;  Laterality: N/A;  . right kidney drained    . SIMPLE MASTECTOMY WITH AXILLARY SENTINEL NODE BIOPSY Right 12/23/2019    Procedure: Right SIMPLE MASTECTOMY WITH AXILLARY SENTINEL NODE BIOPSY;  Surgeon: RRonny Bacon MD;  Location: [facility];  Service: General;  Laterality: Right;  . TEE WITHOUT CARDIOVERSION N/A 11/28/2013   Procedure: TRANSESOPHAGEAL ECHOCARDIOGRAM (TEE);  Surgeon: PThayer Headings MD;  [facility];  Service: Cardiovascular;  Laterality: N/A;    SOCIAL HISTORY: Social History   Socioeconomic History  . Marital status: Married    Spouse name: Not on file  . Number of children: 2  . Years of education: Not on file  . Highest education level: Not on file  Occupational History  . Not on file  Tobacco Use  . Smoking status: Current Every Day Smoker    Packs/day: 1.00    Years: 36.00    Pack years: 36.00    Types: Cigarettes    Start date: 07/11/1981  . Smokeless tobacco: Never Used  . Tobacco comment: 1 ppd now  Vaping Use  . Vaping Use: Former  Substance and Sexual Activity  . Alcohol use: No  . Drug use: No  . Sexual activity: Not Currently  Other Topics Concern  . Not on file  Social History Narrative   ** Merged History Encounter **  Lives in [place]   Married but lives with Boyfriend - not legally separated   Disabled - for BiPolar, Seizure disorder, diabetes   Formerly worked at [employer] 1 ppd; no alcohol. 2 step sons; no biologic children.       Social Determinants of Health   Financial Resource Strain:   . Difficulty of Paying Living Expenses:   Food Insecurity:   . Worried About Charity fundraiser in the Last Year:   . Arboriculturist in the Last Year:   Transportation Needs:   . Film/video editor (Medical):   Marland Kitchen Lack of Transportation (Non-Medical):   Physical Activity:   . Days of Exercise per Week:   . Minutes of Exercise per Session:   Stress:   . Feeling of Stress :   Social Connections:   . Frequency of Communication with Friends and Family:   . Frequency of Social Gatherings with Friends and Family:   . Attends  Religious Services:   . Active Member of Clubs or Organizations:   . Attends Archivist Meetings:   Marland Kitchen Marital Status:   Intimate Partner Violence:   . Fear of Current or Ex-Partner:   . Emotionally Abused:   Marland Kitchen Physically Abused:   . Sexually Abused:     FAMILY HISTORY: Family History  Problem Relation Age of Onset  . Venous thrombosis Brother   . Other Brother        BRAIN TUMOR  . Asthma Father   . Diabetes Father   . Coronary artery disease Mother   . Hypertension Mother   . Diabetes Mother   . Breast cancer Mother 37  . Asthma Sister   . Diabetes type II Brother     ALLERGIES:  is allergic to adhesive [tape], metoprolol, montelukast, morphine sulfate, penicillins, prednisone, diltiazem, gabapentin, and midodrine.  MEDICATIONS:  Current Outpatient Medications  Medication Sig Dispense Refill  . acetaminophen (TYLENOL) 500 MG tablet Take 1,000 mg by mouth every 6 (six) hours as needed for moderate pain.     Marland Kitchen albuterol (VENTOLIN HFA) 108 (90 Base) MCG/ACT inhaler Inhale 2 puffs into the lungs every 6 (six) hours as needed for wheezing or shortness of breath.    . ALPRAZolam (XANAX) 1 MG tablet Take 1 mg by mouth 4 (four) times daily as needed for anxiety.     Marland Kitchen aspirin EC 81 MG tablet Take 81 mg by mouth daily before breakfast.     . budesonide-formoterol (SYMBICORT) 80-4.5 MCG/ACT inhaler INHALE 2 PUFFS BY MOUTH EVERY 12 HOURS TO PREVENT COUGH OR WHEEZING *RINSE MOUTH AFTER EACH USE* (Patient taking differently: Inhale 2 puffs into the lungs in the morning and at bedtime. ) 10.2 g 3  . busPIRone (BUSPAR) 30 MG tablet Take 30 mg by mouth 2 (two) times daily.    . citalopram (CELEXA) 20 MG tablet Take 1 tablet (20 mg total) by mouth daily. 30 tablet 2  . Dapagliflozin-metFORMIN HCl ER 5-500 MG TB24 Take 1 tablet by mouth daily with breakfast.    . famotidine (PEPCID) 20 MG tablet TAKE 1 TABLET (20 MG TOTAL) BY MOUTH 2 (TWO) TIMES DAILY. FOR HEARTBURN. (Patient taking  differently: Take 20 mg by mouth 2 (two) times daily. ) 180 tablet 1  . FLUoxetine (PROZAC) 20 MG tablet Take 20 mg by mouth daily.    Marland Kitchen glucose blood (ONETOUCH VERIO) test strip 1 each by Other route daily. E11.9; E10.42 100 each  0  . Insulin Pen Needle 32G X 4 MM MISC Used to give insulin injections twice daily. 100 each 11  . insulin regular human CONCENTRATED (HUMULIN R) 500 UNIT/ML injection Inject 60-80 Units into the skin 2 (two) times daily before a meal. (based upon blood glucose reading)    . Insulin Syringe-Needle U-100 (INSULIN SYRINGE 1CC/30GX1/2") 30G X 1/2" 1 ML MISC 1 each by Does not apply route 2 (two) times daily. E11.9 200 each 0  . ipratropium-albuterol (DUONEB) 0.5-2.5 (3) MG/3ML SOLN Take 3 mLs by nebulization every 4 (four) hours as needed (wheezing/shortness of breath). 360 mL 0  . metolazone (ZAROXOLYN) 5 MG tablet Take 5 mg by mouth daily.     . nicotine (NICODERM CQ - DOSED IN MG/24 HOURS) 21 mg/24hr patch Place 1 patch (21 mg total) onto the skin daily. 28 patch 0  . ondansetron (ZOFRAN) 4 MG tablet Take 4 mg by mouth daily as needed for nausea.     Glory Rosebush Delica Lancets 40J MISC 1 each by Does not apply route 3 (three) times daily. Use to monitor glucose levels three times daily; E11.9; E10.42 100 each 11  . oxyCODONE (ROXICODONE) 5 MG immediate release tablet Take 1 tablet (5 mg total) by mouth every 8 (eight) hours as needed. 20 tablet 0  . OXYGEN Inhale 2-4 L into the lungs at bedtime.     . pregabalin (LYRICA) 300 MG capsule Take 1 capsule (300 mg total) by mouth 2 (two) times daily. For neuropathy. 180 capsule 0  . rOPINIRole (REQUIP) 1 MG tablet TAKE 1 TABLET BY MOUTH EVERY NIGHT AT BEDTIME FOR RESTLESS LEGS. (Patient taking differently: Take 1 mg by mouth at bedtime. ) 90 tablet 1  . rosuvastatin (CRESTOR) 10 MG tablet Take 1 tablet (10 mg total) by mouth daily. (Patient taking differently: Take 10 mg by mouth at bedtime. ) 90 tablet 3  . spironolactone  (ALDACTONE) 50 MG tablet TAKE 1 TABLET BY MOUTH ONCE A DAY (Patient taking differently: Take 50 mg by mouth daily. ) 90 tablet 1  . tamoxifen (NOLVADEX) 20 MG tablet Take 1 tablet (20 mg total) by mouth daily. 30 tablet 1  . torsemide (DEMADEX) 20 MG tablet TAKE 1 TABLET BY MOUTH 4 TIMES DAILY (Patient taking differently: Take 20 mg by mouth 2 (two) times daily. ) 360 tablet 3  . traZODone (DESYREL) 100 MG tablet Take 200 mg by mouth at bedtime.     Marland Kitchen ULTICARE MINI PEN NEEDLES 31G X 6 MM MISC USE AS DIRECTED THREE TIMES DAILY 100 each 0   No current facility-administered medications for this visit.      Marland Kitchen  PHYSICAL EXAMINATION: ECOG PERFORMANCE STATUS: 0 - Asymptomatic  There were no vitals filed for this visit. There were no vitals filed for this visit.  Physical Exam Constitutional:      Comments: Patient is obese.  She is in a wheelchair.  Accompanied by husband.  She smells nicotine.  HENT:     Head: Normocephalic and atraumatic.     Mouth/Throat:     Pharynx: No oropharyngeal exudate.  Eyes:     Pupils: Pupils are equal, round, and reactive to light.  Cardiovascular:     Rate and Rhythm: Normal rate and regular rhythm.  Pulmonary:     Effort: No respiratory distress.     Breath sounds: No wheezing.  Abdominal:     General: Bowel sounds are normal. There is no distension.     Palpations:  Abdomen is soft. There is no mass.     Tenderness: There is no abdominal tenderness. There is no guarding or rebound.  Musculoskeletal:        General: No tenderness. Normal range of motion.     Cervical back: Normal range of motion and neck supple.  Skin:    General: Skin is warm.  Neurological:     Mental Status: She is alert and oriented to person, place, and time.  Psychiatric:        Mood and Affect: Affect normal.     Comments: Anxious.      LABORATORY DATA:  I have reviewed the data as listed Lab Results  Component Value Date   WBC 11.0 (H) 12/25/2019   HGB 11.6 (L)  12/25/2019   HCT 36.5 12/25/2019   MCV 85.5 12/25/2019   PLT 185 12/25/2019   Recent Labs    09/23/19 1732 10/23/19 1619 11/18/19 0810 11/18/19 0810 11/27/19 2214 11/27/19 2214 12/16/19 1035 12/24/19 0531 12/25/19 0433  NA  --    < > 130*   < > 133*   < > 134* 130* 135  K  --    < > 3.7   < > 4.4   < > 3.8 4.3 4.5  CL  --    < > 86*   < > 95*   < > 89* 88* 95*  CO2  --    < > 30   < > 26   < > _0 GLUCOSE  --    < > 342*   < > 407*   < > 183* 494* 388*  BUN  --    < > 75*   < > 57*   < > 60* 70* 62*  CREATININE  --    < > 1.47*   < > 1.79*   < > 1.93* 2.21* 1.60*  CALCIUM  --    < > 9.4   < > 8.8*   < > 9.2 8.7* 8.8*  GFRNONAA  --    < > 41*   < > 32*   < > 30* 25* 37*  GFRAA  --    < > 48*   < > 38*   < > 34* 29* 43*  PROT 8.8*  --  9.0*  --  8.8*  --  8.8*  --   --   ALBUMIN 4.0  --  4.0  --  3.7  --  3.6  --   --   AST 40  --  31  --  34  --  27  --   --   ALT 44  --  34  --  35  --  26  --   --   ALKPHOS 125  --  104  --  116  --  114  --   --   BILITOT 1.1  --  0.9  --  1.0  --  1.2  --   --   BILIDIR 0.2  --   --   --   --   --   --   --   --   IBILI 0.9  --   --   --   --   --   --   --   --    < > = values in this interval not displayed.    RADIOGRAPHIC STUDIES: I have personally reviewed the radiological images as listed and agreed  with the findings in the report. DG Chest 2 View  Result Date: 12/16/2019 CLINICAL DATA:  Weakness EXAM: CHEST - 2 VIEW COMPARISON:  11/27/2019 FINDINGS: Lungs are clear.  No pleural effusion or pneumothorax. The heart is normal in size. Mild degenerative changes of the visualized thoracolumbar spine. IMPRESSION: Normal chest radiographs. Electronically Signed   By: Julian Hy M.D.   On: 12/16/2019 11:46   CT Head Wo Contrast  Result Date: 12/16/2019 CLINICAL DATA:  Left-sided weakness EXAM: CT HEAD WITHOUT CONTRAST TECHNIQUE: Contiguous axial images were obtained from the base of the skull through the vertex without  intravenous contrast. COMPARISON:  09/23/2019 FINDINGS: Brain: No evidence of acute infarction, hemorrhage, hydrocephalus, extra-axial collection or mass lesion/mass effect. Vascular: No hyperdense vessel or unexpected calcification. Skull: Normal. Negative for fracture or focal lesion. Sinuses/Orbits: No acute finding. Other: Stable BB is noted within the left parietal scalp. IMPRESSION: No acute intracranial abnormality is noted. Electronically Signed   By: Inez Catalina M.D.   On: 12/16/2019 10:57   NM SENTINEL NODE INJECTION  Result Date: 12/23/2019 CLINICAL DATA:  Right breast cancer. EXAM: NUCLEAR MEDICINE BREAST LYMPHOSCINTIGRAPHY TECHNIQUE: Intradermal injection of radiopharmaceutical was performed at the 12 o'clock, 3 o'clock, 6 o'clock, and 9 o'clock positions around the right nipple. The patient was then sent to the operating room where the sentinel node(s) were identified and removed by the surgeon. RADIOPHARMACEUTICALS:  Total of 1 mCi Millipore-filtered Technetium-83msulfur colloid, injected in four aliquots of 0.25 mCi each. IMPRESSION: Uncomplicated intradermal injection of a total of 1 mCi Technetium-977mulfur colloid for purposes of sentinel node identification. Electronically Signed   By: JoSandi Mariscal.D.   On: 12/23/2019 09:13    ASSESSMENT & PLAN:   No problem-specific Assessment & Plan notes found for this encounter.  # 40 minutes face-to-face with the patient discussing the above plan of care; more than 50% of time spent on prognosis/ natural history; counseling and coordination.   All questions were answered. The patient/family knows to call the clinic with any problems, questions or concerns.    GoCammie SickleMD 01/07/2020 8:06 AM

## 2020-01-09 ENCOUNTER — Encounter: Payer: Self-pay | Admitting: Physician Assistant

## 2020-01-09 ENCOUNTER — Ambulatory Visit (INDEPENDENT_AMBULATORY_CARE_PROVIDER_SITE_OTHER): Payer: Self-pay | Admitting: Physician Assistant

## 2020-01-09 ENCOUNTER — Other Ambulatory Visit: Payer: Self-pay

## 2020-01-09 VITALS — BP 111/70 | HR 80 | Temp 97.7°F | Ht 64.0 in | Wt 283.0 lb

## 2020-01-09 DIAGNOSIS — Z09 Encounter for follow-up examination after completed treatment for conditions other than malignant neoplasm: Secondary | ICD-10-CM

## 2020-01-09 DIAGNOSIS — C50511 Malignant neoplasm of lower-outer quadrant of right female breast: Secondary | ICD-10-CM

## 2020-01-09 DIAGNOSIS — Z170 Estrogen receptor positive status [ER+]: Secondary | ICD-10-CM

## 2020-01-09 NOTE — Progress Notes (Signed)
[facility] POST-OP OFFICE VISIT  01/09/2020  HPI: [name] is a 49 y.o. female 17 days s/p right simple mastectomy and SLNB for invasive mammary carcinoma of the right breast with Dr Christian Mate. Her postoperative course has been complicated by hematoma and high output drainage from her drain.   She continues to have around 130+ ccs are day of serosanguinous / old blood appearing drainage from her drain.  Biggest complaint is pain at the drain site.  No fever or chills She does report some drainage from the middle of her incision, serosanguinous, no evidence of infection No new complaints, she has not had any oncology follow up arranged  Vital signs: BP 111/70   Pulse 80   Temp 97.7 F (36.5 C)   Ht 5\' 4"  (1.626 m)   Wt 283 lb (128.4 kg)   SpO2 (!) 88%   BMI 48.58 kg/m    Physical Exam: Constitutional: Well appearing, sitting in wheelchair, NAD Skin: Right mastectomy incision is healing reasonably well, there is an area in the middle-to-more-medial portion which appears to have some scabbing over this, there is no expressible drainage or areas of fluctuance, crepitus, or skin necrosis that I can appreciation. Drain is in the more lateral portion of this wound still with erosanguinous / old blood appearing drainage  Assessment/Plan: This is a 49 y.o. female 17 days s/p right simple mastectomy and SLNB for invasive mammary carcinoma of the right breast   - She continues to have significant drain output and this still has the appearance of old blood and has not cleared yet. I did NOT remove drain, encouraged to continue this and monitor output. Educated patient and family that we need to see this slow down and/or clear prior to removing  - I will refill pain medications  - I do think she would benefit from oncology follow up to review pathology and ensure no further treatment needed  - She will rtc next week for drain re-check with Dr Christian Mate  -- Edison Simon, PA-C [facility] 01/09/2020, 10:37 AM [phone] M-F: 7am - 4pm

## 2020-01-09 NOTE — Patient Instructions (Signed)
Keep monitoring and recording your drain output.   Continue dressing changes.  May wash with a warm cloth after removing your dressing. Pat dry and reapply dressing.   Follow up next week.

## 2020-01-10 ENCOUNTER — Other Ambulatory Visit: Payer: Self-pay | Admitting: Physician Assistant

## 2020-01-10 ENCOUNTER — Telehealth: Payer: Self-pay | Admitting: Surgery

## 2020-01-10 MED ORDER — OXYCODONE HCL 5 MG PO TABS: 5.0000 mg | ORAL_TABLET | Freq: Three times a day (TID) | ORAL | 0 refills | Status: DC | PRN

## 2020-01-10 NOTE — Telephone Encounter (Signed)
Patient is calling and is asking for one of the nurses to give her a call, said she has questions about her tube is has nothing coming out.

## 2020-01-10 NOTE — Telephone Encounter (Signed)
Patient called back and she called her pharmacy and they have not received any refills on oxycodone. Please call and advise

## 2020-01-10 NOTE — Telephone Encounter (Signed)
Per Otho Ket, PA-C he will refill pain prescription of Oxycodone to patient's local pharmacy. Patient also advised to keep an eye out for her drain output and if it continues to have zero output then Dr.Rodenberg will possibly remove it at her post operative appt. Scheduled for Tuesday July 6th. Patient verbalized understanding and has no further questions.

## 2020-01-14 ENCOUNTER — Encounter: Payer: Medicare HMO | Admitting: Surgery

## 2020-01-14 ENCOUNTER — Other Ambulatory Visit: Payer: Self-pay

## 2020-01-14 ENCOUNTER — Encounter: Payer: Self-pay | Admitting: Surgery

## 2020-01-14 ENCOUNTER — Ambulatory Visit (INDEPENDENT_AMBULATORY_CARE_PROVIDER_SITE_OTHER): Payer: Self-pay | Admitting: Surgery

## 2020-01-14 VITALS — BP 153/74 | HR 84 | Temp 97.7°F | Resp 16 | Ht 64.0 in | Wt 285.0 lb

## 2020-01-14 DIAGNOSIS — Z09 Encounter for follow-up examination after completed treatment for conditions other than malignant neoplasm: Secondary | ICD-10-CM

## 2020-01-14 DIAGNOSIS — Z9011 Acquired absence of right breast and nipple: Secondary | ICD-10-CM

## 2020-01-14 HISTORY — DX: Acquired absence of right breast and nipple: Z90.11

## 2020-01-14 NOTE — Progress Notes (Signed)
[facility] POST-OP OFFICE VISIT  01/14/2020  HPI: [name] is a 49 y.o. female 3 weeks days s/p right simple mastectomy with sentinel lymph node biopsy.  Postoperative course was complicated by excessive drainage, hematoma formation.  She is also had remarkable challenges with her glycemic control while in the hospital, she reports good control at home.  She currently denies any fevers or chills.  She reports she is having drainage from her incision, while noting diminished drainage output from her drain site.  She took her last pain pill anticipating this morning, requested more for fears of pain rather than actual pain.  Vital signs: BP (!) 153/74   Pulse 84   Temp 97.7 F (36.5 C) (Oral)   Resp 16   Ht 5\' 4"  (1.626 m)   Wt 285 lb (129.3 kg)   SpO2 93%   BMI 48.92 kg/m    Physical Exam: Constitutional: She is alert, engaged, in no distress.  Skin: Right chest wall there is a medial 1/4-1/3 of the incision where there has been full-thickness dermal necrosis with a yellowish eschar.  There is no active drainage from this point, though this has been the site of drainage.  The Jackson-Pratt drain is compressed/charged, without remarkable output.  Last recorded output was 20 mL yesterday.  I proceeded with removal of the drain and having immediate old bloody drainage from the site.  I then proceeded with sharp debridement of the eschar of the medial breast area, removing a couple of the dermal sutures.  This allowed access to a area of cavity, which also had some fatty tissue that had no evidence of granulation.  There was no evidence of true retained fluid there however there is obviously a poor healing result in this region.  I utilized a 12 inch long half-inch iodoform packing strip and whipped this opening to encourage further drainage and instructed her caregiver to do the same on a daily basis after showering.  There is no surrounding induration, erythema or  malodor.  Assessment/Plan: This is a 49 y.o. female 3 weeks s/p right mastectomy with sentinel lymph node biopsy.  Complications of postoperative hematoma are now liquefying and mobilizing well.  However we have lost some integrity of the incision where the edges of her flap have necrosis in this area we will work to gradually debriding these over time, and provide packing daily to encourage further drainage and healing from within.  We will continue to dress the drain site with what ever drainage continues to present itself there.  I do not see a role for antibiotics at this time.  I do not feel prophylactic antibiotics would be helpful.  We will see her back in 1 week to assess her progress.  I believe her caregiver understands and is capable of assisting her in following through.  Patient Active Problem List   Diagnosis Date Noted  . Acute kidney injury superimposed on CKD ([place])   . Breast cancer, right breast ([place]) 12/23/2019  . Hyperlipidemia   . Carcinoma of lower-outer quadrant of right breast in female, estrogen receptor positive ([place]) 11/12/2019  . Restrictive lung disease 10/08/2019  . Breast mass 10/07/2019  . Postmenopausal bleeding 08/30/2019  . Carotid stenosis, asymptomatic, right 08/16/2019  . Nausea and vomiting 07/15/2019  . Cellulitis of left lower extremity 06/04/2019  . Multiple wounds of skin 06/04/2019  . Other injury of unspecified body region, initial encounter 06/04/2019  . Left-sided weakness 09/13/2018  . Weakness of left lower  extremity 09/05/2018  . Recurrent falls 09/05/2018  . PAD (peripheral artery disease) ([place]) 08/27/2018  . Chronic congestive heart failure ([place]) 08/02/2018  . Vaginal itching 06/15/2018  . Foot pain, bilateral 05/22/2018  . Chronic upper extremity pain (Secondary Area of Pain) (Bilateral) 04/23/2018  . Diabetic polyneuropathy associated with type 2 diabetes mellitus ([place]) 04/23/2018  . Long term prescription benzodiazepine use  04/23/2018  . Neurogenic pain 04/23/2018  . Vitamin D insufficiency 01/29/2018  . Elevated C-reactive protein (CRP) 01/29/2018  . Elevated sedimentation rate 01/29/2018  . Pressure injury of skin 01/29/2018  . Poorly controlled type 2 diabetes mellitus ([place]) 01/23/2018  . DNR (do not resuscitate) 01/23/2018  . CKD stage 3 due to type 2 diabetes mellitus ([place]) 01/23/2018  . IBS (irritable bowel syndrome) 01/19/2018  . Elevated serum hCG 01/19/2018  . Chronic lower extremity pain (Primary Area of Pain) (Bilateral) 01/08/2018  . Chronic low back pain [facility] Area of Pain) (Bilateral) w/ sciatica (Bilateral) 01/08/2018  . Chronic pain syndrome 01/08/2018  . Pharmacologic therapy 01/08/2018  . Disorder of skeletal system 01/08/2018  . Problems influencing health status 01/08/2018  . Long term current use of opiate analgesic 01/08/2018  . Restless leg syndrome 08/02/2017  . Nocturnal hypoxemia 03/29/2017  . Vision disturbance 02/28/2017  . CKD (chronic kidney disease), stage II 02/28/2017  . Visual loss, bilateral   . Chronic respiratory failure with hypoxia (HCC)/ nocturnal 02 dep  10/28/2016  . Upper airway cough syndrome 08/22/2016  . Cigarette smoker 06/15/2016  . Simple chronic bronchitis ([place])   . Coronary artery disease involving native heart without angina pectoris 05/16/2016  . Chronic diastolic CHF (congestive heart failure) ([place]) 11/04/2015  . Orthopnea 11/03/2015  . Bilateral leg edema   . Diabetes ([place])   . COPD exacerbation ([place]) 10/15/2015  . Fracture of rib, closed 10/15/2015  . Venous stasis of both lower extremities 03/29/2015  . Preventative health care 02/04/2015  . Essential hypertension 01/02/2015  . Inguinal hernia 11/27/2014  . Allergic rhinitis with postnasal drip 01/21/2014  . Aortic valve disorders 04/18/2013  . Sleep apnea 05/22/2012  . Seizure disorder ([facility]) 10/09/2011  . Bipolar disorder ([place]) 10/09/2011  . TIA (transient ischemic attack)  06/22/2011  . Constipation 06/15/2011  . HYPERTRIGLYCERIDEMIA 07/17/2007  . Situational anxiety 05/30/2007  . End stage COPD ([place]) 05/30/2007  . Morbid (severe) obesity due to excess calories ([place]) 04/23/2007  . Tobacco abuse 09/07/2006    -As above, will see her next week.   Ronny Bacon M.D., FACS 01/14/2020, 10:45 AM

## 2020-01-14 NOTE — Patient Instructions (Addendum)
Remove the packing material and the dressings from the wound. Wash the area with soap and water and rinse well. Pat dry. Replace the packing material into the wound. Cover the incision area with the large gauze pad and secure with tape.   Please see your follow up appointment listed below.

## 2020-01-16 DIAGNOSIS — J449 Chronic obstructive pulmonary disease, unspecified: Secondary | ICD-10-CM | POA: Diagnosis not present

## 2020-01-17 ENCOUNTER — Telehealth: Payer: Self-pay | Admitting: Primary Care

## 2020-01-17 NOTE — Telephone Encounter (Signed)
Noted  

## 2020-01-17 NOTE — Telephone Encounter (Signed)
[name] with [facility] called  She wanted to let you know that they will be d/c the patient from service due to non compliance. Several missed appts and declining appts .

## 2020-01-21 ENCOUNTER — Encounter: Payer: Self-pay | Admitting: Surgery

## 2020-01-23 ENCOUNTER — Encounter: Payer: Self-pay | Admitting: Surgery

## 2020-01-23 ENCOUNTER — Telehealth: Payer: Self-pay | Admitting: Emergency Medicine

## 2020-01-23 ENCOUNTER — Other Ambulatory Visit: Payer: Self-pay

## 2020-01-23 ENCOUNTER — Ambulatory Visit: Payer: Self-pay | Admitting: Surgery

## 2020-01-23 ENCOUNTER — Ambulatory Visit (INDEPENDENT_AMBULATORY_CARE_PROVIDER_SITE_OTHER): Payer: Self-pay | Admitting: Surgery

## 2020-01-23 VITALS — BP 126/76 | HR 85 | Temp 98.5°F | Resp 14 | Ht 64.0 in | Wt 279.0 lb

## 2020-01-23 DIAGNOSIS — Z9011 Acquired absence of right breast and nipple: Secondary | ICD-10-CM

## 2020-01-23 NOTE — Patient Instructions (Signed)
Change the dressing once a day. You may shower prior to changing the dressing, remove the gauze and let soap and water run down the area.  Remove the old dressing. If you do not shower prior to this, make sure you clean the area with water. Wet the gauze and place it inside the wound. Place a piece of the lube gauze on top and then place a dry gauze on top of that.   Take tylenol as needed for pain. Our surgery scheduler will contact you to schedule your surgery. Please have the blue sheet available when she calls you.   Wound Care, Adult Taking care of your wound properly can help to prevent pain, infection, and scarring. It can also help your wound to heal more quickly. How to care for your wound Wound care      Follow instructions from your health care provider about how to take care of your wound. Make sure you: ? Wash your hands with soap and water before you change the bandage (dressing). If soap and water are not available, use hand sanitizer. ? Change your dressing as told by your health care provider. ? Leave stitches (sutures), skin glue, or adhesive strips in place. These skin closures may need to stay in place for 2 weeks or longer. If adhesive strip edges start to loosen and curl up, you may trim the loose edges. Do not remove adhesive strips completely unless your health care provider tells you to do that.  Check your wound area every day for signs of infection. Check for: ? Redness, swelling, or pain. ? Fluid or blood. ? Warmth. ? Pus or a bad smell.  Ask your health care provider if you should clean the wound with mild soap and water. Doing this may include: ? Using a clean towel to pat the wound dry after cleaning it. Do not rub or scrub the wound. ? Applying a cream or ointment. Do this only as told by your health care provider. ? Covering the incision with a clean dressing.  Ask your health care provider when you can leave the wound uncovered.  Keep the dressing dry  until your health care provider says it can be removed. Do not take baths, swim, use a hot tub, or do anything that would put the wound underwater until your health care provider approves. Ask your health care provider if you can take showers. You may only be allowed to take sponge baths. Medicines   If you were prescribed an antibiotic medicine, cream, or ointment, take or use the antibiotic as told by your health care provider. Do not stop taking or using the antibiotic even if your condition improves.  Take over-the-counter and prescription medicines only as told by your health care provider. If you were prescribed pain medicine, take it 30 or more minutes before you do any wound care or as told by your health care provider. General instructions  Return to your normal activities as told by your health care provider. Ask your health care provider what activities are safe.  Do not scratch or pick at the wound.  Do not use any products that contain nicotine or tobacco, such as cigarettes and e-cigarettes. These may delay wound healing. If you need help quitting, ask your health care provider.  Keep all follow-up visits as told by your health care provider. This is important.  Eat a diet that includes protein, vitamin A, vitamin C, and other nutrient-rich foods to help the wound heal. ?  Foods rich in protein include meat, dairy, beans, nuts, and other sources. ? Foods rich in vitamin A include carrots and dark green, leafy vegetables. ? Foods rich in vitamin C include citrus, tomatoes, and other fruits and vegetables. ? Nutrient-rich foods have protein, carbohydrates, fat, vitamins, or minerals. Eat a variety of healthy foods including vegetables, fruits, and whole grains. Contact a health care provider if:  You received a tetanus shot and you have swelling, severe pain, redness, or bleeding at the injection site.  Your pain is not controlled with medicine.  You have redness, swelling, or  pain around the wound.  You have fluid or blood coming from the wound.  Your wound feels warm to the touch.  You have pus or a bad smell coming from the wound.  You have a fever or chills.  You are nauseous or you vomit.  You are dizzy. Get help right away if:  You have a red streak going away from your wound.  The edges of the wound open up and separate.  Your wound is bleeding, and the bleeding does not stop with gentle pressure.  You have a rash.  You faint.  You have trouble breathing. Summary  Always wash your hands with soap and water before changing your bandage (dressing).  To help with healing, eat foods that are rich in protein, vitamin A, vitamin C, and other nutrients.  Check your wound every day for signs of infection. Contact your health care provider if you suspect that your wound is infected. This information is not intended to replace advice given to you by your health care provider. Make sure you discuss any questions you have with your health care provider. Document Revised: 10/15/2018 Document Reviewed: 01/12/2016 Elsevier Patient Education  [place].

## 2020-01-23 NOTE — H&P (View-Only) (Signed)
[facility] POST-OP OFFICE VISIT  01/23/2020  HPI: [name] is a 49 y.o. female who on June 14 had a simple right mastectomy with sentinel lymph node biopsy.  She is a complicated patient with multiple comorbidities.  Surprisingly despite her new wound issues, she is doing remarkably well.  She denies fevers and chills.  Her husband/care provider has stopped packing her wound due to her complaints.  Apparently they discontinued their home health service.  Vital signs: BP 126/76   Pulse 85   Temp 98.5 F (36.9 C)   Resp 14   Ht _0  (1.626 m)   Wt 279 lb (126.6 kg)   SpO2 94%   BMI 47.89 kg/m    Physical Exam: Constitutional: Morbidly obese, however she appears well and bright and alert today. Abdomen: Rounded soft nontender. Skin: Right mastectomy incision has a 4 cm gap from medial to lateral, now wide enough at 1 to 1-1/2 cm, the edges of this wound at the dermal level has a necrotic eschar that is yellow and soft.  The underlying fatty tissues have very poor evidence of granulation and in fact have a degree of liquefactive necrosis.  There is no retained purulent drainage, erythema, malodor, tunneling or abscess cavitation. On examination with a probe utilizing a bone curette I find that there is undermining along the incision both medially and laterally for a distance but fortunately not much from cephalad to caudad.  There is extensive soft tissues that were easily debrided with a curette, and nearing completion of this I have noted that the chest wall had significant granulation present there.  Despite how dire this sounds it has evidence of improvement.  I believe she will progress more readily with enlarging her wound and utilizing negative pressure wound therapy.  Assessment/Plan: This is a 49 y.o. female 64 days s/p simple right mastectomy, currently with wound dehiscence/postoperative seroma infection/postoperative hematoma infection. Considering her  comorbid status, she shows evidence of improving. Reluctant to put her under general anesthesia, however we could utilize a combination of local MAC pending anesthesia's advice. I would like to proceed with a more aggressive excisional debridement under anesthesia with placement of a negative pressure wound therapy, we have contacted [employer] and hope to have all the supplies present to apply a wound VAC at the time of her surgery and keep her surgery as an ambulatory/outpatient procedure. Our plan also includes follow-up at the wound center for wound VAC changes twice weekly, and it has been discussed with them and will anticipate follow-up there.  Patient Active Problem List   Diagnosis Date Noted  . Status post mastectomy, right 01/14/2020  . Acute kidney injury superimposed on CKD ([place])   . Breast cancer, right breast ([place]) 12/23/2019  . Hyperlipidemia   . Carcinoma of lower-outer quadrant of right breast in female, estrogen receptor positive ([place]) 11/12/2019  . Restrictive lung disease 10/08/2019  . Postmenopausal bleeding 08/30/2019  . Carotid stenosis, asymptomatic, right 08/16/2019  . Nausea and vomiting 07/15/2019  . Cellulitis of left lower extremity 06/04/2019  . Multiple wounds of skin 06/04/2019  . Other injury of unspecified body region, initial encounter 06/04/2019  . Left-sided weakness 09/13/2018  . Weakness of left lower extremity 09/05/2018  . Recurrent falls 09/05/2018  . PAD (peripheral artery disease) ([place]) 08/27/2018  . Chronic congestive heart failure ([place]) 08/02/2018  . Vaginal itching 06/15/2018  . Foot pain, bilateral 05/22/2018  . Chronic upper extremity pain (Secondary Area of Pain) (Bilateral) 04/23/2018  .  Diabetic polyneuropathy associated with type 2 diabetes mellitus ([place]) 04/23/2018  . Long term prescription benzodiazepine use 04/23/2018  . Neurogenic pain 04/23/2018  . Vitamin D insufficiency 01/29/2018  . Elevated C-reactive protein (CRP) 01/29/2018  .  Elevated sedimentation rate 01/29/2018  . Pressure injury of skin 01/29/2018  . Poorly controlled type 2 diabetes mellitus ([place]) 01/23/2018  . DNR (do not resuscitate) 01/23/2018  . CKD stage 3 due to type 2 diabetes mellitus ([place]) 01/23/2018  . IBS (irritable bowel syndrome) 01/19/2018  . Elevated serum hCG 01/19/2018  . Chronic lower extremity pain (Primary Area of Pain) (Bilateral) 01/08/2018  . Chronic low back pain [facility] Area of Pain) (Bilateral) w/ sciatica (Bilateral) 01/08/2018  . Chronic pain syndrome 01/08/2018  . Pharmacologic therapy 01/08/2018  . Disorder of skeletal system 01/08/2018  . Problems influencing health status 01/08/2018  . Long term current use of opiate analgesic 01/08/2018  . Restless leg syndrome 08/02/2017  . Nocturnal hypoxemia 03/29/2017  . Vision disturbance 02/28/2017  . CKD (chronic kidney disease), stage II 02/28/2017  . Visual loss, bilateral   . Chronic respiratory failure with hypoxia (HCC)/ nocturnal 02 dep  10/28/2016  . Upper airway cough syndrome 08/22/2016  . Cigarette smoker 06/15/2016  . Simple chronic bronchitis ([place])   . Coronary artery disease involving native heart without angina pectoris 05/16/2016  . Chronic diastolic CHF (congestive heart failure) ([place]) 11/04/2015  . Orthopnea 11/03/2015  . Bilateral leg edema   . Diabetes ([place])   . COPD exacerbation ([place]) 10/15/2015  . Fracture of rib, closed 10/15/2015  . Venous stasis of both lower extremities 03/29/2015  . Preventative health care 02/04/2015  . Essential hypertension 01/02/2015  . Inguinal hernia 11/27/2014  . Allergic rhinitis with postnasal drip 01/21/2014  . Aortic valve disorders 04/18/2013  . Sleep apnea 05/22/2012  . Seizure disorder ([place]) 10/09/2011  . Bipolar disorder ([name]) 10/09/2011  . TIA (transient ischemic attack) 06/22/2011  . Constipation 06/15/2011  . HYPERTRIGLYCERIDEMIA 07/17/2007  . Situational anxiety 05/30/2007  . End stage COPD ([place])  05/30/2007  . Morbid (severe) obesity due to excess calories ([place]) 04/23/2007  . Tobacco abuse 09/07/2006      Ronny Bacon M.D., [facility] 01/23/2020, 3:58 PM

## 2020-01-23 NOTE — Telephone Encounter (Signed)
Spoke to [employer] in reference to getting patient in to get wound vac changes after surgery. In discussion with Maudie Mercury and Dr Christian Mate, it was said for patient to have wound vac changes twice a week starting the week of 02/10/20 (due to short staff at wound clinic). Dr Christian Mate to put on discharge paperwork after surgery to have nurse and/or patient call wound clinic to schedule that appt.  Dr Christian Mate is aware of the above.

## 2020-01-23 NOTE — Progress Notes (Signed)
[facility] POST-OP OFFICE VISIT  01/23/2020  HPI: [name] is a 49 y.o. female who on June 14 had a simple right mastectomy with sentinel lymph node biopsy.  She is a complicated patient with multiple comorbidities.  Surprisingly despite her new wound issues, she is doing remarkably well.  She denies fevers and chills.  Her husband/care provider has stopped packing her wound due to her complaints.  Apparently they discontinued their home health service.  Vital signs: BP 126/76   Pulse 85   Temp 98.5 F (36.9 C)   Resp 14   Ht 5' 4" (1.626 m)   Wt 279 lb (126.6 kg)   SpO2 94%   BMI 47.89 kg/m    Physical Exam: Constitutional: Morbidly obese, however she appears well and bright and alert today. Abdomen: Rounded soft nontender. Skin: Right mastectomy incision has a 4 cm gap from medial to lateral, now wide enough at 1 to 1-1/2 cm, the edges of this wound at the dermal level has a necrotic eschar that is yellow and soft.  The underlying fatty tissues have very poor evidence of granulation and in fact have a degree of liquefactive necrosis.  There is no retained purulent drainage, erythema, malodor, tunneling or abscess cavitation. On examination with a probe utilizing a bone curette I find that there is undermining along the incision both medially and laterally for a distance but fortunately not much from cephalad to caudad.  There is extensive soft tissues that were easily debrided with a curette, and nearing completion of this I have noted that the chest wall had significant granulation present there.  Despite how dire this sounds it has evidence of improvement.  I believe she will progress more readily with enlarging her wound and utilizing negative pressure wound therapy.  Assessment/Plan: This is a 49 y.o. female 70 days s/p simple right mastectomy, currently with wound dehiscence/postoperative seroma infection/postoperative hematoma infection. Considering her  comorbid status, she shows evidence of improving. Reluctant to put her under general anesthesia, however we could utilize a combination of local MAC pending anesthesia's advice. I would like to proceed with a more aggressive excisional debridement under anesthesia with placement of a negative pressure wound therapy, we have contacted [employer] and hope to have all the supplies present to apply a wound VAC at the time of her surgery and keep her surgery as an ambulatory/outpatient procedure. Our plan also includes follow-up at the wound center for wound VAC changes twice weekly, and it has been discussed with them and will anticipate follow-up there.  Patient Active Problem List   Diagnosis Date Noted  . Status post mastectomy, right 01/14/2020  . Acute kidney injury superimposed on CKD (HCC)   . Breast cancer, right breast (HCC) 12/23/2019  . Hyperlipidemia   . Carcinoma of lower-outer quadrant of right breast in female, estrogen receptor positive (HCC) 11/12/2019  . Restrictive lung disease 10/08/2019  . Postmenopausal bleeding 08/30/2019  . Carotid stenosis, asymptomatic, right 08/16/2019  . Nausea and vomiting 07/15/2019  . Cellulitis of left lower extremity 06/04/2019  . Multiple wounds of skin 06/04/2019  . Other injury of unspecified body region, initial encounter 06/04/2019  . Left-sided weakness 09/13/2018  . Weakness of left lower extremity 09/05/2018  . Recurrent falls 09/05/2018  . PAD (peripheral artery disease) (HCC) 08/27/2018  . Chronic congestive heart failure (HCC) 08/02/2018  . Vaginal itching 06/15/2018  . Foot pain, bilateral 05/22/2018  . Chronic upper extremity pain (Secondary Area of Pain) (Bilateral) 04/23/2018  .   Diabetic polyneuropathy associated with type 2 diabetes mellitus ([place]) 04/23/2018  . Long term prescription benzodiazepine use 04/23/2018  . Neurogenic pain 04/23/2018  . Vitamin D insufficiency 01/29/2018  . Elevated C-reactive protein (CRP) 01/29/2018  .  Elevated sedimentation rate 01/29/2018  . Pressure injury of skin 01/29/2018  . Poorly controlled type 2 diabetes mellitus ([place]) 01/23/2018  . DNR (do not resuscitate) 01/23/2018  . CKD stage 3 due to type 2 diabetes mellitus ([place]) 01/23/2018  . IBS (irritable bowel syndrome) 01/19/2018  . Elevated serum hCG 01/19/2018  . Chronic lower extremity pain (Primary Area of Pain) (Bilateral) 01/08/2018  . Chronic low back pain [facility] Area of Pain) (Bilateral) w/ sciatica (Bilateral) 01/08/2018  . Chronic pain syndrome 01/08/2018  . Pharmacologic therapy 01/08/2018  . Disorder of skeletal system 01/08/2018  . Problems influencing health status 01/08/2018  . Long term current use of opiate analgesic 01/08/2018  . Restless leg syndrome 08/02/2017  . Nocturnal hypoxemia 03/29/2017  . Vision disturbance 02/28/2017  . CKD (chronic kidney disease), stage II 02/28/2017  . Visual loss, bilateral   . Chronic respiratory failure with hypoxia (HCC)/ nocturnal 02 dep  10/28/2016  . Upper airway cough syndrome 08/22/2016  . Cigarette smoker 06/15/2016  . Simple chronic bronchitis ([place])   . Coronary artery disease involving native heart without angina pectoris 05/16/2016  . Chronic diastolic CHF (congestive heart failure) ([place]) 11/04/2015  . Orthopnea 11/03/2015  . Bilateral leg edema   . Diabetes ([place])   . COPD exacerbation ([place]) 10/15/2015  . Fracture of rib, closed 10/15/2015  . Venous stasis of both lower extremities 03/29/2015  . Preventative health care 02/04/2015  . Essential hypertension 01/02/2015  . Inguinal hernia 11/27/2014  . Allergic rhinitis with postnasal drip 01/21/2014  . Aortic valve disorders 04/18/2013  . Sleep apnea 05/22/2012  . Seizure disorder ([place]) 10/09/2011  . Bipolar disorder ([name]) 10/09/2011  . TIA (transient ischemic attack) 06/22/2011  . Constipation 06/15/2011  . HYPERTRIGLYCERIDEMIA 07/17/2007  . Situational anxiety 05/30/2007  . End stage COPD ([place])  05/30/2007  . Morbid (severe) obesity due to excess calories ([place]) 04/23/2007  . Tobacco abuse 09/07/2006      Ronny Bacon M.D., [facility] 01/23/2020, 3:58 PM

## 2020-01-24 ENCOUNTER — Telehealth: Payer: Self-pay | Admitting: Emergency Medicine

## 2020-01-24 ENCOUNTER — Telehealth: Payer: Self-pay | Admitting: Surgery

## 2020-01-24 NOTE — Telephone Encounter (Signed)
Outgoing call is made, left message for patient to call.  Please advise patient of Pre-Admission date/time, COVID Testing date and Surgery date.  Surgery Date: 01/29/20 Preadmission Testing Date: 01/27/20 (phone 8a-1p) Covid Testing Date: 01/28/20 - patient advised to go to the [facility] ([place]) between 8a-1p   Also patient needs to call [phone], between 1-3:00pm the day before surgery, to find out what time to arrive for surgery.

## 2020-01-24 NOTE — Telephone Encounter (Signed)
[name] called stating they are not in network with patients insurance to prescribe wound vac supplies. She gave me another number to call that is in network to get the wound vac supplies from.

## 2020-01-24 NOTE — Telephone Encounter (Signed)
Outbound call made to Priddy with [employer] in regards to wound vac supplies. Discussed with Jeneen Rinks the situation that is going on and pt needing supplies for sx day on 01/29/20 so Dr Christian Mate can place wound vac in OR.  Demo sheet, insurance, last note, last OP note all faxed to [facility].  P: [phone] F: [phone]  Dr Christian Mate made aware of.

## 2020-01-27 ENCOUNTER — Other Ambulatory Visit: Admission: RE | Admit: 2020-01-27 | Payer: BC Managed Care – PPO | Source: Ambulatory Visit

## 2020-01-27 ENCOUNTER — Other Ambulatory Visit: Payer: BC Managed Care – PPO

## 2020-01-27 NOTE — Telephone Encounter (Signed)
Patient calls back, she is aware of dates regarding her surgery and voices understanding.

## 2020-01-28 ENCOUNTER — Other Ambulatory Visit: Payer: Self-pay

## 2020-01-28 ENCOUNTER — Other Ambulatory Visit
Admission: RE | Admit: 2020-01-28 | Discharge: 2020-01-28 | Disposition: A | Discharge status: Home / Self Care | Payer: BC Managed Care – PPO | Source: Ambulatory Visit | Attending: Surgery | Admitting: Surgery

## 2020-01-28 ENCOUNTER — Other Ambulatory Visit: Payer: Self-pay | Admitting: Endocrinology

## 2020-01-28 ENCOUNTER — Encounter
Admission: RE | Admit: 2020-01-28 | Discharge: 2020-01-28 | Disposition: A | Discharge status: Home / Self Care | Payer: BC Managed Care – PPO | Source: Ambulatory Visit | Attending: Surgery | Admitting: Surgery

## 2020-01-28 DIAGNOSIS — Z01812 Encounter for preprocedural laboratory examination: Secondary | ICD-10-CM | POA: Diagnosis not present

## 2020-01-28 DIAGNOSIS — T8131XA Disruption of external operation (surgical) wound, not elsewhere classified, initial encounter: Secondary | ICD-10-CM | POA: Diagnosis not present

## 2020-01-28 DIAGNOSIS — Z20822 Contact with and (suspected) exposure to covid-19: Secondary | ICD-10-CM | POA: Insufficient documentation

## 2020-01-28 DIAGNOSIS — E1165 Type 2 diabetes mellitus with hyperglycemia: Secondary | ICD-10-CM

## 2020-01-28 LAB — CBC WITH DIFFERENTIAL/PLATELET
Abs Immature Granulocytes: 0.11 10*3/uL — ABNORMAL HIGH (ref 0.00–0.07)
Basophils Absolute: 0.1 10*3/uL (ref 0.0–0.1)
Basophils Relative: 1 %
Eosinophils Absolute: 0.3 10*3/uL (ref 0.0–0.5)
Eosinophils Relative: 2 %
HCT: 37.7 % (ref 36.0–46.0)
Hemoglobin: 11.6 g/dL — ABNORMAL LOW (ref 12.0–15.0)
Immature Granulocytes: 1 %
Lymphocytes Relative: 9 %
Lymphs Abs: 1 10*3/uL (ref 0.7–4.0)
MCH: 25.5 pg — ABNORMAL LOW (ref 26.0–34.0)
MCHC: 30.8 g/dL (ref 30.0–36.0)
MCV: 82.9 fL (ref 80.0–100.0)
Monocytes Absolute: 0.6 10*3/uL (ref 0.1–1.0)
Monocytes Relative: 6 %
Neutro Abs: 9.2 10*3/uL — ABNORMAL HIGH (ref 1.7–7.7)
Neutrophils Relative %: 81 %
Platelets: 222 10*3/uL (ref 150–400)
RBC: 4.55 MIL/uL (ref 3.87–5.11)
RDW: 17.9 % — ABNORMAL HIGH (ref 11.5–15.5)
WBC: 11.3 10*3/uL — ABNORMAL HIGH (ref 4.0–10.5)
nRBC: 0 % (ref 0.0–0.2)

## 2020-01-28 LAB — COMPREHENSIVE METABOLIC PANEL
ALT: 19 U/L (ref 0–44)
AST: 16 U/L (ref 15–41)
Albumin: 3.5 g/dL (ref 3.5–5.0)
Alkaline Phosphatase: 124 U/L (ref 38–126)
Anion gap: 13 (ref 5–15)
BUN: 35 mg/dL — ABNORMAL HIGH (ref 6–20)
CO2: 29 mmol/L (ref 22–32)
Calcium: 8.6 mg/dL — ABNORMAL LOW (ref 8.9–10.3)
Chloride: 90 mmol/L — ABNORMAL LOW (ref 98–111)
Creatinine, Ser: 1.42 mg/dL — ABNORMAL HIGH (ref 0.44–1.00)
GFR calc Af Amer: 49 mL/min — ABNORMAL LOW (ref >60)
GFR calc non Af Amer: 43 mL/min — ABNORMAL LOW (ref >60)
Glucose, Bld: 395 mg/dL — ABNORMAL HIGH (ref 70–99)
Potassium: 3.8 mmol/L (ref 3.5–5.1)
Sodium: 132 mmol/L — ABNORMAL LOW (ref 135–145)
Total Bilirubin: 1.3 mg/dL — ABNORMAL HIGH (ref 0.3–1.2)
Total Protein: 8.6 g/dL — ABNORMAL HIGH (ref 6.5–8.1)

## 2020-01-28 LAB — SARS CORONAVIRUS 2 (TAT 6-24 HRS): SARS Coronavirus 2: NEGATIVE

## 2020-01-28 MED ORDER — VANCOMYCIN HCL 1500 MG/300ML IV SOLN
1500.0000 mg | INTRAVENOUS | Status: AC
  Administered 2020-01-29: 1500 mg via INTRAVENOUS
  Filled 2020-01-28: qty 300

## 2020-01-28 NOTE — Pre-Procedure Instructions (Signed)
Labs sent to Dr. Christian Mate for review.

## 2020-01-28 NOTE — Patient Instructions (Signed)
Your procedure is scheduled on: January 29, 2020 [facility] Report to Day Surgery on the 2nd floor of the [facility]. To find out your arrival time, please call [phone] between 1PM - 3PM on: January 28, 2020 TUESDAY  REMEMBER: Instructions that are not followed completely may result in serious medical risk, up to and including death; or upon the discretion of your surgeon and anesthesiologist your surgery may need to be rescheduled.  Do not eat food after midnight the night before surgery.  No gum chewing, lozengers or hard candies.  You may however, drink CLEAR liquids up to 2 hours before you are scheduled to arrive for your surgery. Do not drink anything within 2 hours of your scheduled arrival time.  Clear liquids include: - water   Do NOT drink anything that is not on this list.  Type 1 and Type 2 diabetics should only drink water.  TAKE THESE MEDICATIONS THE MORNING OF SURGERY WITH A SIP OF WATER: XANAX LYRICA PROZAC BUSPAR FAMOTADINE (take one the night before and one on the morning of surgery - helps to prevent nausea after surgery.)  Use inhalers on the day of surgery  Stop Metformin  2 days prior to surgery.  Take 1/2 of usual insulin dose the night before surgery and none on the morning of surgery.  Follow recommendations from Cardiologist, Pulmonologist or PCP regarding stopping Aspirin, Coumadin, Plavix, Eliquis, Pradaxa, or Pletal.  Stop Anti-inflammatories (NSAIDS) such as Advil, Aleve, Ibuprofen, Motrin, Naproxen, Naprosyn and Aspirin based products such as Excedrin, Goodys Powder, BC Powder. (May take Tylenol or Acetaminophen if needed.)  Stop ANY OVER THE COUNTER supplements until after surgery. (May continue Vitamin D, Vitamin B, and multivitamin.)  No Alcohol for 24 hours before or after surgery.  No Smoking including e-cigarettes for 24 hours prior to surgery.  No chewable tobacco products for at least 6 hours prior to surgery.  No nicotine  patches on the day of surgery.  Do not use any "recreational" drugs for at least a week prior to your surgery.  Please be advised that the combination of cocaine and anesthesia may have negative outcomes, up to and including death. If you test positive for cocaine, your surgery will be cancelled.  On the morning of surgery brush your teeth with toothpaste and water, you may rinse your mouth with mouthwash if you wish. Do not swallow any toothpaste or mouthwash.  Do not wear jewelry, make-up, hairpins, clips or nail polish.  Do not wear lotions, powders, or perfumes.   Do not shave 48 hours prior to surgery.   Contact lenses, hearing aids and dentures may not be worn into surgery.  Do not bring valuables to the hospital. [facility] is not responsible for any missing/lost belongings or valuables.   Use CHG wipes as directed on instruction sheet.  Notify your doctor if there is any change in your medical condition (cold, fever, infection).  Wear comfortable clothing (specific to your surgery type) to the hospital.  Plan for stool softeners for home use; pain medications have a tendency to cause constipation. You can also help prevent constipation by eating foods high in fiber such as fruits and vegetables and drinking plenty of fluids as your diet allows.  After surgery, you can help prevent lung complications by doing breathing exercises.  Take deep breaths and cough every 1-2 hours. Your doctor may order a device called an Incentive Spirometer to help you take deep breaths. When coughing or sneezing, hold a  pillow firmly against your incision with both hands. This is called "splinting." Doing this helps protect your incision. It also decreases belly discomfort.  If you are being discharged the day of surgery, you will not be allowed to drive home. You will need a responsible adult (18 years or older) to drive you home and stay with you that night.   If you are taking public  transportation, you will need to have a responsible adult (18 years or older) with you. Please confirm with your physician that it is acceptable to use public transportation.   Please call the [facility] Dept. at [phone] if you have any questions about these instructions.  Visitation Policy:  Patients undergoing a surgery or procedure may have one family member or support person with them as long as that person is not COVID-19 positive or experiencing its symptoms.  That person may remain in the waiting area during the procedure.  Children under 65 years of age may have both parents or legal guardians with them during their procedure.  Inpatient Visitation Update:   Two designated support people may visit a patient during visiting hours 7 am to 8 pm. It must be the same two designated people for the duration of the patient stay. The visitors may come and go during the day, and there is no switching out to have different visitors. A mask must be worn at all times, including in the patient room.  Children under 79 years of age:  a total of 4 designated visitors for the child's entire stay are allowed. Only 2 in the room at a time and only one staying overnight at a time. The overnight guest can now rotate during the child's hospital stay.  As a reminder, masks are still required for all [facility] team members, patients and visitors in all [facility] facilities.   Systemwide, no visitors 17 or younger.

## 2020-01-29 ENCOUNTER — Ambulatory Visit
Admission: RE | Admit: 2020-01-29 | Discharge: 2020-01-29 | Disposition: A | Discharge status: Home / Self Care | Payer: BC Managed Care – PPO | Attending: Surgery | Admitting: Surgery

## 2020-01-29 ENCOUNTER — Encounter
Admission: RE | Disposition: A | Discharge status: Home / Self Care | Payer: Self-pay | Source: Home / Self Care | Attending: Surgery

## 2020-01-29 ENCOUNTER — Other Ambulatory Visit: Payer: Self-pay

## 2020-01-29 ENCOUNTER — Ambulatory Visit: Payer: BC Managed Care – PPO | Admitting: Certified Registered"

## 2020-01-29 ENCOUNTER — Encounter: Payer: Self-pay | Admitting: Surgery

## 2020-01-29 DIAGNOSIS — I2510 Atherosclerotic heart disease of native coronary artery without angina pectoris: Secondary | ICD-10-CM | POA: Insufficient documentation

## 2020-01-29 DIAGNOSIS — E781 Pure hyperglyceridemia: Secondary | ICD-10-CM | POA: Diagnosis not present

## 2020-01-29 DIAGNOSIS — Z79899 Other long term (current) drug therapy: Secondary | ICD-10-CM | POA: Diagnosis not present

## 2020-01-29 DIAGNOSIS — I5032 Chronic diastolic (congestive) heart failure: Secondary | ICD-10-CM | POA: Diagnosis not present

## 2020-01-29 DIAGNOSIS — G40909 Epilepsy, unspecified, not intractable, without status epilepticus: Secondary | ICD-10-CM | POA: Insufficient documentation

## 2020-01-29 DIAGNOSIS — J441 Chronic obstructive pulmonary disease with (acute) exacerbation: Secondary | ICD-10-CM | POA: Diagnosis not present

## 2020-01-29 DIAGNOSIS — I4510 Unspecified right bundle-branch block: Secondary | ICD-10-CM | POA: Insufficient documentation

## 2020-01-29 DIAGNOSIS — T8149XA Infection following a procedure, other surgical site, initial encounter: Secondary | ICD-10-CM | POA: Diagnosis not present

## 2020-01-29 DIAGNOSIS — G894 Chronic pain syndrome: Secondary | ICD-10-CM | POA: Insufficient documentation

## 2020-01-29 DIAGNOSIS — Z9011 Acquired absence of right breast and nipple: Secondary | ICD-10-CM | POA: Diagnosis not present

## 2020-01-29 DIAGNOSIS — E785 Hyperlipidemia, unspecified: Secondary | ICD-10-CM | POA: Diagnosis not present

## 2020-01-29 DIAGNOSIS — X58XXXA Exposure to other specified factors, initial encounter: Secondary | ICD-10-CM | POA: Insufficient documentation

## 2020-01-29 DIAGNOSIS — F319 Bipolar disorder, unspecified: Secondary | ICD-10-CM | POA: Insufficient documentation

## 2020-01-29 DIAGNOSIS — Z170 Estrogen receptor positive status [ER+]: Secondary | ICD-10-CM | POA: Insufficient documentation

## 2020-01-29 DIAGNOSIS — N183 Chronic kidney disease, stage 3 unspecified: Secondary | ICD-10-CM | POA: Diagnosis not present

## 2020-01-29 DIAGNOSIS — Z6841 Body Mass Index (BMI) 40.0 and over, adult: Secondary | ICD-10-CM | POA: Diagnosis not present

## 2020-01-29 DIAGNOSIS — I130 Hypertensive heart and chronic kidney disease with heart failure and stage 1 through stage 4 chronic kidney disease, or unspecified chronic kidney disease: Secondary | ICD-10-CM | POA: Diagnosis not present

## 2020-01-29 DIAGNOSIS — K589 Irritable bowel syndrome without diarrhea: Secondary | ICD-10-CM | POA: Insufficient documentation

## 2020-01-29 DIAGNOSIS — F17210 Nicotine dependence, cigarettes, uncomplicated: Secondary | ICD-10-CM | POA: Diagnosis not present

## 2020-01-29 DIAGNOSIS — Z794 Long term (current) use of insulin: Secondary | ICD-10-CM | POA: Insufficient documentation

## 2020-01-29 DIAGNOSIS — C50511 Malignant neoplasm of lower-outer quadrant of right female breast: Secondary | ICD-10-CM | POA: Insufficient documentation

## 2020-01-29 DIAGNOSIS — E559 Vitamin D deficiency, unspecified: Secondary | ICD-10-CM | POA: Insufficient documentation

## 2020-01-29 DIAGNOSIS — T8130XA Disruption of wound, unspecified, initial encounter: Secondary | ICD-10-CM | POA: Diagnosis not present

## 2020-01-29 DIAGNOSIS — G4730 Sleep apnea, unspecified: Secondary | ICD-10-CM | POA: Insufficient documentation

## 2020-01-29 DIAGNOSIS — Z7982 Long term (current) use of aspirin: Secondary | ICD-10-CM | POA: Diagnosis not present

## 2020-01-29 DIAGNOSIS — E1142 Type 2 diabetes mellitus with diabetic polyneuropathy: Secondary | ICD-10-CM | POA: Diagnosis not present

## 2020-01-29 DIAGNOSIS — C50911 Malignant neoplasm of unspecified site of right female breast: Secondary | ICD-10-CM | POA: Diagnosis not present

## 2020-01-29 DIAGNOSIS — I69354 Hemiplegia and hemiparesis following cerebral infarction affecting left non-dominant side: Secondary | ICD-10-CM | POA: Diagnosis not present

## 2020-01-29 DIAGNOSIS — F419 Anxiety disorder, unspecified: Secondary | ICD-10-CM | POA: Diagnosis not present

## 2020-01-29 DIAGNOSIS — J449 Chronic obstructive pulmonary disease, unspecified: Secondary | ICD-10-CM | POA: Insufficient documentation

## 2020-01-29 DIAGNOSIS — E1122 Type 2 diabetes mellitus with diabetic chronic kidney disease: Secondary | ICD-10-CM | POA: Diagnosis not present

## 2020-01-29 HISTORY — PX: APPLICATION OF WOUND VAC: SHX5189

## 2020-01-29 HISTORY — PX: WOUND DEBRIDEMENT: SHX247

## 2020-01-29 LAB — GLUCOSE, CAPILLARY
Glucose-Capillary: 267 mg/dL — ABNORMAL HIGH (ref 70–99)
Glucose-Capillary: 212 mg/dL — ABNORMAL HIGH (ref 70–99)

## 2020-01-29 LAB — POCT PREGNANCY, URINE: Preg Test, Ur: NEGATIVE

## 2020-01-29 SURGERY — DEBRIDEMENT, WOUND
Anesthesia: General | Site: Chest | Laterality: Right

## 2020-01-29 MED ORDER — BUPIVACAINE-EPINEPHRINE (PF) 0.25% -1:200000 IJ SOLN
INTRAMUSCULAR | Status: AC
  Filled 2020-01-29: qty 30

## 2020-01-29 MED ORDER — BUPIVACAINE LIPOSOME 1.3 % IJ SUSP: 20.0000 mL | Freq: Once | INTRAMUSCULAR | Status: DC

## 2020-01-29 MED ORDER — CELECOXIB 200 MG PO CAPS
ORAL_CAPSULE | ORAL | Status: AC
  Administered 2020-01-29: 200 mg via ORAL
  Filled 2020-01-29: qty 1

## 2020-01-29 MED ORDER — CELECOXIB 200 MG PO CAPS: 200.0000 mg | ORAL_CAPSULE | ORAL | Status: AC

## 2020-01-29 MED ORDER — ACETAMINOPHEN 500 MG PO TABS
ORAL_TABLET | ORAL | Status: AC
  Administered 2020-01-29: 1000 mg via ORAL
  Filled 2020-01-29: qty 2

## 2020-01-29 MED ORDER — MIDAZOLAM HCL 2 MG/2ML IJ SOLN
INTRAMUSCULAR | Status: AC
  Filled 2020-01-29: qty 2

## 2020-01-29 MED ORDER — ALBUTEROL SULFATE HFA 108 (90 BASE) MCG/ACT IN AERS
INHALATION_SPRAY | RESPIRATORY_TRACT | Status: AC
  Filled 2020-01-29: qty 6.7

## 2020-01-29 MED ORDER — ONDANSETRON HCL 4 MG/2ML IJ SOLN
INTRAMUSCULAR | Status: AC
  Filled 2020-01-29: qty 2

## 2020-01-29 MED ORDER — ORAL CARE MOUTH RINSE: 15.0000 mL | Freq: Once | OROMUCOSAL | Status: AC

## 2020-01-29 MED ORDER — SUCCINYLCHOLINE CHLORIDE 200 MG/10ML IV SOSY
PREFILLED_SYRINGE | INTRAVENOUS | Status: AC
  Filled 2020-01-29: qty 10

## 2020-01-29 MED ORDER — CHLORHEXIDINE GLUCONATE 0.12 % MT SOLN
OROMUCOSAL | Status: AC
  Administered 2020-01-29: 15 mL via OROMUCOSAL
  Filled 2020-01-29: qty 15

## 2020-01-29 MED ORDER — FENTANYL CITRATE (PF) 100 MCG/2ML IJ SOLN
INTRAMUSCULAR | Status: AC
  Filled 2020-01-29: qty 2

## 2020-01-29 MED ORDER — ALBUTEROL SULFATE HFA 108 (90 BASE) MCG/ACT IN AERS
INHALATION_SPRAY | RESPIRATORY_TRACT | Status: DC | PRN
Start: 2020-01-29 — End: 2020-01-29
  Administered 2020-01-29: 2 via RESPIRATORY_TRACT
  Administered 2020-01-29: 3 via RESPIRATORY_TRACT

## 2020-01-29 MED ORDER — SODIUM CHLORIDE 0.9 % IV SOLN: INTRAVENOUS | Status: DC

## 2020-01-29 MED ORDER — FENTANYL CITRATE (PF) 100 MCG/2ML IJ SOLN
INTRAMUSCULAR | Status: DC | PRN
  Administered 2020-01-29: 50 ug via INTRAVENOUS

## 2020-01-29 MED ORDER — ONDANSETRON HCL 4 MG/2ML IJ SOLN: 4.0000 mg | Freq: Once | INTRAMUSCULAR | Status: DC | PRN

## 2020-01-29 MED ORDER — IPRATROPIUM-ALBUTEROL 0.5-2.5 (3) MG/3ML IN SOLN
RESPIRATORY_TRACT | Status: AC
  Filled 2020-01-29: qty 3

## 2020-01-29 MED ORDER — CHLORHEXIDINE GLUCONATE CLOTH 2 % EX PADS
6.0000 | MEDICATED_PAD | Freq: Once | CUTANEOUS | Status: AC
  Administered 2020-01-29: 6 via TOPICAL

## 2020-01-29 MED ORDER — LIDOCAINE HCL (PF) 2 % IJ SOLN
INTRAMUSCULAR | Status: AC
  Filled 2020-01-29: qty 5

## 2020-01-29 MED ORDER — IPRATROPIUM-ALBUTEROL 0.5-2.5 (3) MG/3ML IN SOLN
3.0000 mL | Freq: Once | RESPIRATORY_TRACT | Status: AC
  Administered 2020-01-29: 3 mL via RESPIRATORY_TRACT

## 2020-01-29 MED ORDER — CHLORHEXIDINE GLUCONATE 0.12 % MT SOLN: 15.0000 mL | Freq: Once | OROMUCOSAL | Status: AC

## 2020-01-29 MED ORDER — PROPOFOL 10 MG/ML IV BOLUS
INTRAVENOUS | Status: DC | PRN
  Administered 2020-01-29: 150 mg via INTRAVENOUS

## 2020-01-29 MED ORDER — MIDAZOLAM HCL 2 MG/2ML IJ SOLN
INTRAMUSCULAR | Status: DC | PRN
  Administered 2020-01-29: 2 mg via INTRAVENOUS

## 2020-01-29 MED ORDER — PHENYLEPHRINE HCL (PRESSORS) 10 MG/ML IV SOLN
INTRAVENOUS | Status: DC | PRN
  Administered 2020-01-29 (×4): 100 ug via INTRAVENOUS

## 2020-01-29 MED ORDER — LIDOCAINE HCL (CARDIAC) PF 100 MG/5ML IV SOSY
PREFILLED_SYRINGE | INTRAVENOUS | Status: DC | PRN
  Administered 2020-01-29: 100 mg via INTRAVENOUS

## 2020-01-29 MED ORDER — FENTANYL CITRATE (PF) 100 MCG/2ML IJ SOLN: 25.0000 ug | INTRAMUSCULAR | Status: DC | PRN

## 2020-01-29 MED ORDER — PHENYLEPHRINE HCL (PRESSORS) 10 MG/ML IV SOLN
INTRAVENOUS | Status: AC
  Filled 2020-01-29: qty 1

## 2020-01-29 MED ORDER — SUCCINYLCHOLINE CHLORIDE 20 MG/ML IJ SOLN
INTRAMUSCULAR | Status: DC | PRN
  Administered 2020-01-29: 120 mg via INTRAVENOUS

## 2020-01-29 MED ORDER — ACETAMINOPHEN 500 MG PO TABS: 1000.0000 mg | ORAL_TABLET | ORAL | Status: AC

## 2020-01-29 MED ORDER — ONDANSETRON HCL 4 MG/2ML IJ SOLN
INTRAMUSCULAR | Status: DC | PRN
  Administered 2020-01-29: 4 mg via INTRAVENOUS

## 2020-01-29 SURGICAL SUPPLY — 28 items
BLADE SURG 15 STRL LF DISP TIS (BLADE) ×2 IMPLANT
BLADE SURG 15 STRL SS (BLADE) ×3
CANISTER SUCT 3000ML PPV (MISCELLANEOUS) ×6 IMPLANT
COVER WAND RF STERILE (DRAPES) ×3 IMPLANT
DRAIN PENROSE 1/4X12 LTX STRL (WOUND CARE) IMPLANT
DRAIN PENROSE 5/8X18 LTX STRL (DRAIN) IMPLANT
DRAPE LAPAROTOMY 77X122 PED (DRAPES) ×3 IMPLANT
ELECT CAUTERY BLADE 6.4 (BLADE) ×3 IMPLANT
ELECT REM PT RETURN 9FT ADLT (ELECTROSURGICAL) ×3
ELECTRODE REM PT RTRN 9FT ADLT (ELECTROSURGICAL) ×2 IMPLANT
GAUZE SPONGE 4X4 12PLY STRL (GAUZE/BANDAGES/DRESSINGS) IMPLANT
GLOVE ORTHO TXT STRL SZ7.5 (GLOVE) ×3 IMPLANT
GOWN STRL REUS W/ TWL LRG LVL3 (GOWN DISPOSABLE) ×4 IMPLANT
GOWN STRL REUS W/TWL LRG LVL3 (GOWN DISPOSABLE) ×6
KIT TURNOVER KIT A (KITS) ×3 IMPLANT
NEEDLE HYPO 22GX1.5 SAFETY (NEEDLE) ×3 IMPLANT
NS IRRIG 1000ML POUR BTL (IV SOLUTION) ×12 IMPLANT
PACK BASIN MINOR (MISCELLANEOUS) ×3 IMPLANT
PAD ABD DERMACEA PRESS 5X9 (GAUZE/BANDAGES/DRESSINGS) IMPLANT
SOL PREP PROV IODINE SCRUB 4OZ (MISCELLANEOUS) ×3 IMPLANT
SOL PREP PVP 2OZ (MISCELLANEOUS)
SOLUTION PREP PVP 2OZ (MISCELLANEOUS) IMPLANT
SPONGE LAP 18X18 RF (DISPOSABLE) ×3 IMPLANT
SUT ETHILON 3-0 FS-10 30 BLK (SUTURE)
SUTURE EHLN 3-0 FS-10 30 BLK (SUTURE) IMPLANT
SWAB CULTURE AMIES ANAERIB BLU (MISCELLANEOUS) ×3 IMPLANT
SYR 10ML LL (SYRINGE) ×3 IMPLANT
SYR BULB IRRIG 60ML STRL (SYRINGE) ×3 IMPLANT

## 2020-01-29 NOTE — Op Note (Signed)
Excisional debridement right chest wall, pulse lavage, application of negative pressure wound therapy.  Pre-operative Diagnosis: Post mastectomy wound dehiscence.  Post-operative Diagnosis: same.   Surgeon: Ronny Bacon, M.D., [facility]  Anesthesia: General endotracheal  Findings: 65 to 75% presence of granulation tissue.  Accumulated fibrinous exudates with drainage, and minimal liquefactive slough.  Estimated Blood Loss: 25 mL         Specimens: None          Complications: none              Procedure Details  The patient was seen again in the Holding Room. The benefits, complications, treatment options, and expected outcomes were discussed with the patient. The risks of bleeding, infection, recurrence of symptoms, failure to resolve symptoms, unanticipated injury, prosthetic placement, prosthetic infection, any of which could require further surgery were reviewed with the patient. The likelihood of improving the patient's symptoms with return to their baseline status is hopeful.  The patient and/or family concurred with the proposed plan, giving informed consent.  The patient was taken to Operating Room, identified and the procedure verified.    Prior to the induction of general anesthesia, antibiotic prophylaxis was administered. VTE prophylaxis was in place.  General endotracheal anesthesia was then administered and tolerated well. After the induction, the patient was positioned in the supine position and the right chest wall/postmastectomy wound was prepped with Betadine and draped in the sterile fashion.  A Time Out was held and the above information confirmed.  I lysed the dermal sutures that were maintaining the skin closure laterally, to allow better access to the undermined region of the cavitation present.  Hemostasis was obtained with electrocautery.  I then utilized a bone curette to excise the minimal dermal slough at the medial aspect of the wound to clean viable dermal  tissues.  Minimal excisional debridement of skin and subcutaneous tissues and muscle was necessary with the bone curette.  Then proceeded with cleansing the wound with multiple dry sponges to remove the exudate of debris and slough from the cavity.  I then utilized the pulse lavage with 4 L of normal saline solution. This cleaned up the wound very nicely, I evaluated the wound in its entirety, ensuring that there were no residual cavitation, tunneling or undermining present. I then measured the wound to identify that there was a 6 cm length of cephalad flap medially at its greatest dimension.  Otherwise it was 15 cm from medial to lateral, the skin incision gap was 2 cm.  Maximum depth is 3 cm. I fashioned the [employer] negative pressure wound therapy sponge to fit this cavity. I then applied the overlying clear drape, cut the window for the suction cap applied to suction And the negative pressure wound therapy device to pressures of -125 mmHg. This completed the procedure, she was extubated and transferred to recovery room in a monitored/guarded state.      Ronny Bacon M.D., [facility] 01/29/2020 12:11 PM

## 2020-01-29 NOTE — Progress Notes (Signed)
Trying to wean pt to RA.  She uses O2 at home while sleeping.  Per Dr. Lubertha Basque, satisfied with sending pt home with sats 91% or higher. No distress noted.

## 2020-01-29 NOTE — Transfer of Care (Signed)
Immediate Anesthesia Transfer of Care Note  Patient: [name]  Procedure(s) Performed: DEBRIDEMENT WOUND, Excisional debridement skin, subcutaneous and muscle right chest wall (Right ) APPLICATION OF WOUND VAC (Right Chest)  Patient Location: PACU  Anesthesia Type:General  Level of Consciousness: drowsy  Airway & Oxygen Therapy: Patient Spontanous Breathing and Patient connected to face mask oxygen  Post-op Assessment: Report given to RN  Post vital signs: stable  Last Vitals:  Vitals Value Taken Time  BP 119/55 01/29/20 1155  Temp    Pulse 88 01/29/20 1200  Resp 13 01/29/20 1200  SpO2 93 % 01/29/20 1200  Vitals shown include unvalidated device data.  Last Pain:  Vitals:   01/29/20 0940  TempSrc: Tympanic  PainSc: 0-No pain         Complications: No complications documented.

## 2020-01-29 NOTE — Progress Notes (Signed)
Pt up to wheelchair for discharge. States incision site "stinging" and asks if Dr. Christian Mate can escribe pain mediation. Santiago Glad, post op RN, spoke with RN at MD office who will make Dr. Christian Mate aware of pt request. Pt agreeable for discharge and await response from office.

## 2020-01-29 NOTE — Anesthesia Preprocedure Evaluation (Signed)
Anesthesia Evaluation  Patient identified by MRN, date of birth, ID band Patient awake    Reviewed: Allergy & Precautions, H&P , NPO status , Patient's Chart, lab work & pertinent test results  Airway Mallampati: II  TM Distance: >3 FB Neck ROM: full    Dental  (+) Edentulous Upper, Edentulous Lower   Pulmonary shortness of breath, with exertion and Long-Term Oxygen Therapy, asthma , sleep apnea (non-compliant with CPAP) , COPD (wears 2L [place] while sleeping),  COPD inhaler and oxygen dependent, Current Smoker and Patient abstained from smoking.,    Pulmonary exam normal        Cardiovascular hypertension, + CAD, + Peripheral Vascular Disease and +CHF  Normal cardiovascular exam+ dysrhythmias (RBBB)      Neuro/Psych Seizures -,  PSYCHIATRIC DISORDERS Anxiety Bipolar Disorder TIACVA    GI/Hepatic Neg liver ROS, GERD  ,  Endo/Other  negative endocrine ROSdiabetes  Renal/GU CRFRenal disease     Musculoskeletal   Abdominal   Peds  Hematology  (+) Blood dyscrasia, anemia ,   Anesthesia Other Findings Past Medical History: No date: Anxiety     Comment:  takes Prozac daily No date: Anxiety No date: Aortic valve calcification No date: Asthma     Comment:  Advair and Spirva daily No date: Asthma No date: Bipolar disorder (HCC) No date: CAD (coronary artery disease)     Comment:  a. LHC 11/2013 done for CP/fluid retention: mild disease               in prox LAD, mild-mod disease in mRCA, EF 60% with normal              LVEDP. b. Normal nuc 03/2016. No date: CHF (congestive heart failure) (HCC) No date: Chronic diastolic CHF (congestive heart failure) ([place]) 11/16/2018: Chronic heart failure with preserved ejection fraction (HCC) No date: CKD (chronic kidney disease), stage II No date: COPD (chronic obstructive pulmonary disease) (HCC)     Comment:  a. nocturnal O2. No date: COPD (chronic obstructive pulmonary disease) (HCC) No  date: Coronary artery disease No date: Decreased urine stream No date: Diabetes mellitus No date: Diabetes mellitus without complication (HCC) No date: Dyspnea No date: Family history of adverse reaction to anesthesia     Comment:  mom gets nauseated No date: GERD (gastroesophageal reflux disease)     Comment:  takes Pepcid daily No date: History of blood clots     Comment:  left leg 3-52yrs ago No date: Hyperlipidemia No date: Hypertension No date: Hypertriglyceridemia 01/2015: Inguinal hernia, left No date: Muscle spasm No date: Open wound of genital labia No date: Peripheral neuropathy No date: RBBB No date: Seizures ([place]) 01/19/2018: Sepsis ([place]) No date: Sinus tachycardia     Comment:  a. persistent since 2009. No date: Smokers' cough ([place]) 1989: Stroke [facility])     Comment:  left sided weakness No date: TIA (transient ischemic attack) No date: Tobacco abuse 01/23/2018: Vulvar abscess  Past Surgical History: 11/06/2019: BREAST BIOPSY; Right     Comment:  [place] core path pending venus clip No date: HERNIA REPAIR; Left 01/26/2018: INCISION AND DRAINAGE ABSCESS; N/A     Comment:  Procedure: INCISION AND DEBRIDEMENT OF VULVAR               NECROTIZING SOFT TISSUE INFECTION;  Surgeon: Greer Pickerel, MD;  Location: [facility];  Service: General;  Laterality: N/A; 01/22/2018: INCISION AND DRAINAGE PERIRECTAL ABSCESS; N/A     Comment:  Procedure: IRRIGATION AND DEBRIDEMENT LABIAL/VULVAR               AREA;  Surgeon: Coralie Keens, MD;  Location: [facility];               Service: General;  Laterality: N/A; 01/29/2018: INCISION AND DRAINAGE PERIRECTAL ABSCESS; N/A     Comment:  Procedure: [place];  Surgeon:               Excell Seltzer, MD;  Location: [place] [facility];  Service:               General;  Laterality: N/A; 04/08/2015: INGUINAL HERNIA REPAIR; Left     Comment:  Procedure: OPEN LEFT INGUINAL HERNIA REPAIR WITH MESH;                 Surgeon: Ralene Ok, MD;  Location: [facility];  Service:              General;  Laterality: Left; 04/08/2015: INSERTION OF MESH; Left     Comment:  Procedure: INSERTION OF MESH;  Surgeon: Ralene Ok,              MD;  Location: [facility];  Service: General;  Laterality:               Left; No date: LAPAROSCOPY     Comment:  Endometriosis 12/05/2013: LEFT HEART CATHETERIZATION WITH CORONARY ANGIOGRAM; N/A     Comment:  Procedure: LEFT HEART CATHETERIZATION WITH CORONARY               ANGIOGRAM;  Surgeon: Jettie Booze, MD;  Location:               [facility] CATH LAB;  Service: Cardiovascular;  Laterality: N/A; No date: right kidney drained 12/23/2019: SIMPLE MASTECTOMY WITH AXILLARY SENTINEL NODE BIOPSY; Right     Comment:  Procedure: Right SIMPLE MASTECTOMY WITH AXILLARY               SENTINEL NODE BIOPSY;  Surgeon: Ronny Bacon, MD;                Location: [facility] ORS;  Service: General;  Laterality:               Right; 11/28/2013: TEE WITHOUT CARDIOVERSION; N/A     Comment:  Procedure: TRANSESOPHAGEAL ECHOCARDIOGRAM (TEE);                Surgeon: Thayer Headings, MD;  Location: [facility] ENDOSCOPY;                Service: Cardiovascular;  Laterality: N/A;  BMI    Body Mass Index: 48.06 kg/m      Reproductive/Obstetrics negative OB ROS                             Anesthesia Physical Anesthesia Plan  ASA: IV  Anesthesia Plan: General ETT and Rapid Sequence   Post-op Pain Management:    Induction:   PONV Risk Score and Plan: Ondansetron, Dexamethasone, Midazolam and Treatment may vary due to age or medical condition  Airway Management Planned: Video Laryngoscope Planned  Additional Equipment:   Intra-op Plan:   Post-operative Plan:   Informed Consent: I have reviewed the patients History and Physical, chart, labs and discussed the procedure including the risks, benefits and alternatives for the proposed  anesthesia with the patient or authorized  representative who has indicated his/her understanding and acceptance.     Dental Advisory Given  Plan Discussed with: Anesthesiologist, CRNA and Surgeon  Anesthesia Plan Comments:         Anesthesia Quick Evaluation

## 2020-01-29 NOTE — Discharge Instructions (Addendum)

## 2020-01-29 NOTE — Progress Notes (Signed)
Rec'd from OR w/ Anesthesia in attendance performing jaw lift. Nasal and oral airway in place. O2 sats on 100% facemask = 92%.  R.T notified to start CPAP.  Set on 40%.  Neb tx completed. Pt starting to wake up slowly.  12:30  Pt awake. HOB elevated, sats 95%. CPAP discontinued and switched over to [place] 4L. Sats 94%.

## 2020-01-29 NOTE — Interval H&P Note (Signed)
History and Physical Interval Note:  01/29/2020 10:41 AM  [name]  has presented today for surgery, with the diagnosis of Non healing wound post mastectomy right.  The various methods of treatment have been discussed with the patient and family. After consideration of risks, benefits and other options for treatment, the patient has consented to  Procedure(s): DEBRIDEMENT WOUND, Excisional debridement skin, subcutaneous and muscle right chest wall (Right) as a surgical intervention.  The patient's history has been reviewed, patient examined, no change in status, stable for surgery.  I have reviewed the patient's chart and labs.  Questions were answered to the patient's satisfaction.     Ronny Bacon

## 2020-01-29 NOTE — Anesthesia Procedure Notes (Signed)
Procedure Name: Intubation Date/Time: 01/29/2020 10:58 AM Performed by: Lerry Liner, CRNA Pre-anesthesia Checklist: Patient identified, Emergency Drugs available, Suction available, Patient being monitored and Timeout performed Patient Re-evaluated:Patient Re-evaluated prior to induction Oxygen Delivery Method: Circle system utilized Preoxygenation: Pre-oxygenation with 100% oxygen Induction Type: IV induction Ventilation: Mask ventilation without difficulty Laryngoscope Size: McGraph and 3 Grade View: Grade I Tube type: Oral Tube size: 7.0 mm Number of attempts: 1 Airway Equipment and Method: Stylet Placement Confirmation: ETT inserted through vocal cords under direct vision Secured at: 22 cm Tube secured with: Tape Dental Injury: Teeth and Oropharynx as per pre-operative assessment

## 2020-01-30 ENCOUNTER — Encounter: Payer: Self-pay | Admitting: Surgery

## 2020-01-30 ENCOUNTER — Telehealth: Payer: Self-pay | Admitting: Surgery

## 2020-01-30 DIAGNOSIS — T8131XA Disruption of external operation (surgical) wound, not elsewhere classified, initial encounter: Secondary | ICD-10-CM | POA: Diagnosis not present

## 2020-01-30 NOTE — Anesthesia Postprocedure Evaluation (Signed)
Anesthesia Post Note  Patient: [name]  Procedure(s) Performed: DEBRIDEMENT WOUND, Excisional debridement skin, subcutaneous and muscle right chest wall (Right ) APPLICATION OF WOUND VAC (Right Chest)  Patient location during evaluation: PACU Anesthesia Type: General Level of consciousness: awake and alert Pain management: pain level controlled Vital Signs Assessment: post-procedure vital signs reviewed and stable Respiratory status: spontaneous breathing, nonlabored ventilation and respiratory function stable Cardiovascular status: blood pressure returned to baseline and stable Postop Assessment: no apparent nausea or vomiting Anesthetic complications: no   No complications documented.   Last Vitals:  Vitals:   01/29/20 1337 01/29/20 1345  BP: (!) 95/59 118/63  Pulse:    Resp:    Temp:    SpO2:      Last Pain:  Vitals:   01/30/20 0852  TempSrc:   PainSc: [place]

## 2020-01-30 NOTE — Telephone Encounter (Signed)
Incoming call from patient.  She had surgery 01/29/20 for wound debridement and is now stating at the site is having pain and is wanting something for pain.  She has follow up on 01/31/20 and is aware of this appointment.  Please advise patient.  Thank you.

## 2020-01-30 NOTE — Telephone Encounter (Signed)
Spoke with patient's husband to notify him that patient may try 600 mg of Ibuprofen the first three hours and then patient may take 650 mg of Tylenol the next three hours and was advised to alternate between the two every three hours, therefore patient is taking something every three hours to help with pain. Patient's husband was also informed that patient has an appointment tomorrow at 11:15am if pain worsens they can discuss that at the scheduled appointment. Husband verbalizes understanding.

## 2020-01-30 NOTE — Telephone Encounter (Signed)
Left detailed message for patient to inform her she has an scheduled appointment for 01/31/20 with Otho Ket, PA during that appointment she and he can discuss next steps as far as pain medication. Advised patient to give our office a call back if she has any questions or concerns.

## 2020-01-31 ENCOUNTER — Ambulatory Visit: Payer: Medicare HMO | Admitting: Pulmonary Disease

## 2020-01-31 ENCOUNTER — Ambulatory Visit (INDEPENDENT_AMBULATORY_CARE_PROVIDER_SITE_OTHER): Payer: BC Managed Care – PPO | Admitting: Physician Assistant

## 2020-01-31 ENCOUNTER — Other Ambulatory Visit: Payer: Self-pay

## 2020-01-31 ENCOUNTER — Encounter: Payer: Self-pay | Admitting: Physician Assistant

## 2020-01-31 ENCOUNTER — Telehealth: Payer: Self-pay | Admitting: Internal Medicine

## 2020-01-31 VITALS — BP 113/75 | HR 86 | Temp 98.3°F | Ht 64.0 in

## 2020-01-31 DIAGNOSIS — C50511 Malignant neoplasm of lower-outer quadrant of right female breast: Secondary | ICD-10-CM

## 2020-01-31 DIAGNOSIS — Z09 Encounter for follow-up examination after completed treatment for conditions other than malignant neoplasm: Secondary | ICD-10-CM

## 2020-01-31 DIAGNOSIS — Z170 Estrogen receptor positive status [ER+]: Secondary | ICD-10-CM

## 2020-01-31 DIAGNOSIS — Z9011 Acquired absence of right breast and nipple: Secondary | ICD-10-CM

## 2020-01-31 MED ORDER — TRAMADOL HCL 50 MG PO TABS: 50.0000 mg | ORAL_TABLET | Freq: Four times a day (QID) | ORAL | 0 refills | Status: DC | PRN

## 2020-01-31 NOTE — Progress Notes (Signed)
[facility] POST-OP OFFICE VISIT  01/31/2020  HPI: [name] is a 49 y.o. female 2 days s/p excisional debridement of right chest pall and application of wound vac secondary to right mastectomy wound dehiscence with Dr Christian Mate.  Her biggest complaint is in regards to the wound vac beeping almost since she went home from the OR. She has contacted the wound vac representative and they have been trying to make adjustments.  She continues to endorse pain No fever, chills No other complaints.   Vital signs: BP 113/75   Pulse 86   Temp 98.3 F (36.8 C)   Ht 5\' 4"  (1.626 m)   SpO2 91%   BMI 48.06 kg/m    Physical Exam: Constitutional: Well appearing female, NAD Skin: Large wound to the right chest wall from previous mastectomy, the wound bed is almost 100% granulation tissue, no purulence, no erythema  Assessment/Plan: This is a 49 y.o. female 2 days s/p excisional debridement of right chest pall and application of wound vac secondary to right mastectomy wound dehiscence    - I did complete wound vac change this afternoon at bedside. She tolerated this well. I do believe all their issues at home were related to the wound vac drap not obtaining a good seal. She will continue to have these changed MWF. I will be happy to follow her for this for now. There are plans to involve the wound care center starting in August.   - As far as pain management, I encouraged her to continue the Tylenol for pain as she can not take Ibuprofen secondary to her comorbid conditions. I explained to her and her husband that I would like to limit the level of narcotics she is getting. We will start with Tramadol prn for dressing changes, which she is agreeable with  - rtc on Monday for wound vac change  -- Edison Simon, PA-C [facility] 01/31/2020, 12:06 PM [phone] M-F: 7am - 4pm

## 2020-01-31 NOTE — Patient Instructions (Signed)
We will see you Monday, Wednesday, and Friday next week.  You will then be seen by the wound clinic starting on August 2nd. We will see you on the days that they cannot.

## 2020-01-31 NOTE — Telephone Encounter (Signed)
Pt has been scheduled for 02/18/2020 with Wyn Quaker, NP at [facility] office.  We do not currently have availability in [place].  Pt voiced her understanding and had no further questions.  Nothing further is needed at this time.

## 2020-01-31 NOTE — Progress Notes (Deleted)
@Patient  ID: [name], female    DOB: 01-30-1971, 49 y.o.   MRN: [id number]  No chief complaint on file.   Referring provider: Pleas Koch, NP  HPI:  49 year old female current smoker followed in our office for severe COPD, restrictive lung disease, chronic hypoxic respiratory failure  PMH: Chronic pain syndrome, hyperlipidemia, TIA, bipolar, hypertension, type 2 diabetes, chronic kidney disease, IBS Smoker/ Smoking History: Current Smoker. 0.5 - 1 ppd now. 72 pack history.  Maintenance:  Symbicort 80 Pt of: Dr. Mortimer Fries  01/31/2020  - Visit     Questionaires / Pulmonary Flowsheets:   ACT:  No flowsheet data found.  MMRC: mMRC Dyspnea Scale mMRC Score  10/08/2019 3    Epworth:  Results of the Epworth flowsheet 12/21/2017  Sitting and reading 3  Watching TV 3  Sitting, inactive in a public place (e.g. a theatre or a meeting) 3  As a passenger in a car for an hour without a break 3  Lying down to rest in the afternoon when circumstances permit 2  Sitting and talking to someone 3  Sitting quietly after a lunch without alcohol 3  In a car, while stopped for a few minutes in traffic 3  Total score 23    Tests:   FENO:  No results found for: NITRICOXIDE  PFT: No flowsheet data found.  WALK:  SIX MIN WALK 03/29/2017 10/28/2016 08/22/2016  Supplimental Oxygen during Test? (L/min) No No -  Tech Comments: steady walk, had to stop at the end of 1st lap, pt states she was fatique, no desat, no other comlications TA/CMA slow to normal pace/SOB lap 2//lmr walked at a steady pace/stopped to cough 4 times, unable to do last lap/TA/CMA    Imaging: No results found.  Lab Results:  CBC    Component Value Date/Time   WBC 11.3 (H) 01/28/2020 0911   RBC 4.55 01/28/2020 0911   HGB 11.6 (L) 01/28/2020 0911   HCT 37.7 01/28/2020 0911   PLT 222 01/28/2020 0911   MCV 82.9 01/28/2020 0911   MCH 25.5 (L) 01/28/2020 0911   MCHC 30.8 01/28/2020 0911   RDW 17.9 (H)  01/28/2020 0911   LYMPHSABS 1.0 01/28/2020 0911   MONOABS 0.6 01/28/2020 0911   EOSABS 0.3 01/28/2020 0911   BASOSABS 0.1 01/28/2020 0911    BMET    Component Value Date/Time   NA 132 (L) 01/28/2020 0911   NA 137 01/08/2018 1117   K 3.8 01/28/2020 0911   CL 90 (L) 01/28/2020 0911   CO2 29 01/28/2020 0911   GLUCOSE 395 (H) 01/28/2020 0911   BUN 35 (H) 01/28/2020 0911   BUN 31 (H) 01/08/2018 1117   CREATININE 1.42 (H) 01/28/2020 0911   CREATININE 1.59 (H) 07/18/2019 1506   CALCIUM 8.6 (L) 01/28/2020 0911   GFRNONAA 43 (L) 01/28/2020 0911   GFRAA 49 (L) 01/28/2020 0911    BNP    Component Value Date/Time   BNP 68.4 11/27/2019 2214   BNP <4.0 05/24/2016 0938    ProBNP    Component Value Date/Time   PROBNP 24.7 04/01/2013 1600    Specialty Problems      Pulmonary Problems   End stage COPD ([place])    Spirometry 06/14/2016  FEV1 2.17 (75%)  Ratio 82 with min curvature after am spiriva > d/c'd 06/14/2016  - 06/14/2016    try symbicort 80 2bid   - Spirometry 08/22/2016  No obst p am symb - 08/22/2016   continue symb 80  2bid   - 12/05/2016 notified by Dr Mila Palmer office they would like to make all changes outside of pulmonary rx as pt getting confused with instructions  - 12/29/2016  After extensive coaching HFA effectiveness =   90%       Sleep apnea   Allergic rhinitis with postnasal drip   COPD exacerbation (HCC)   Orthopnea   Simple chronic bronchitis ([place])   Upper airway cough syndrome    Spirometry 08/22/2016  wnl during flare of "wheeze" while on acei > rec trial off ACEi  - 12/05/2016 notified by Dr Mila Palmer office they would like to make all changes outside of pulmonary rx as pt getting confused with instructions       Chronic respiratory failure with hypoxia (HCC)/ nocturnal 02 dep     10/28/2016   Walked RA  2 laps @ 185 ft each stopped due to  Sob, sat still 91% at nl pace  - ONO RA  11/09/16  > desat < 89% x 383 min so rec 02 2lpm and repeat ONO on 2lpm - ONO 2lpm  11/23/16  desat x 160 min  > rec 12/05/2016 = repeat ono on 3lpm NP,  - 12/05/2016 notified by Dr Mila Palmer office they would like to make all changes outside of pulmonary rx as pt getting confused with instructions   - ONO 3lpm re- ordered 12/29/2016 > done 01/18/17 with sats < 89% x 58 min so 01/23/2017  rec 4lpm hs and repeat > done 02/02/17 and still  desat >  02/06/2017 rec formal sleep study      Nocturnal hypoxemia   Restrictive lung disease    Seen on 2018 office spirometry         Allergies  Allergen Reactions  . Adhesive [Tape] Rash and Other (See Comments)    TAKES OFF THE SKIN (CERTAIN MEDICAL TAPES DO THIS!!)  . Metoprolol Shortness Of Breath    Occurrence of shortness of breath after 3 days  . Montelukast Shortness Of Breath  . Morphine Sulfate Anaphylaxis, Shortness Of Breath and Nausea And Vomiting    Swollen Throat - Able to tolerate dilaudid  . Penicillins Anaphylaxis, Hives and Shortness Of Breath    Throat swells Has patient had a PCN reaction causing immediate rash, facial/tongue/throat swelling, SOB or lightheadedness with hypotension: Yes Has patient had a PCN reaction causing severe rash involving mucus membranes or skin necrosis: No Has patient had a PCN reaction that required hospitalization: Yes Has patient had a PCN reaction occurring within the last 10 years: No If all of the above answers are "NO", then may proceed with Cephalosporin use.   . Prednisone Anaphylaxis  . Diltiazem Swelling  . Gabapentin Swelling  . Midodrine     Lightheaded and falling down    Immunization History  Administered Date(s) Administered  . Influenza Split 05/25/2011  . Influenza Whole 03/28/2008, 04/30/2009, 04/10/2016  . Influenza,inj,Quad PF,6+ Mos 06/13/2017, 05/22/2018, 03/12/2019  . Influenza-Unspecified 07/25/2014, 05/12/2015  . Pneumococcal Conjugate-13 05/11/2016  . Pneumococcal Polysaccharide-23 06/15/2011  . Tdap 06/15/2011    Past Medical History:  Diagnosis  Date  . Anxiety    takes Prozac daily  . Anxiety   . Aortic valve calcification   . Asthma    Advair and Spirva daily  . Asthma   . Bipolar disorder ([place])   . CAD (coronary artery disease)    a. LHC 11/2013 done for CP/fluid retention: mild disease in prox LAD, mild-mod disease in mRCA, EF 60% with  normal LVEDP. b. Normal nuc 03/2016.  Marland Kitchen CHF (congestive heart failure) ([place])   . Chronic diastolic CHF (congestive heart failure) ([place])   . Chronic heart failure with preserved ejection fraction ([place]) 11/16/2018  . CKD (chronic kidney disease), stage II   . COPD (chronic obstructive pulmonary disease) (HCC)    a. nocturnal O2.  Marland Kitchen COPD (chronic obstructive pulmonary disease) ([place])   . Coronary artery disease   . Decreased urine stream   . Diabetes mellitus   . Diabetes mellitus without complication ([place])   . Dyspnea   . Family history of adverse reaction to anesthesia    mom gets nauseated  . GERD (gastroesophageal reflux disease)    takes Pepcid daily  . History of blood clots    left leg 3-75yrs ago  . Hyperlipidemia   . Hypertension   . Hypertriglyceridemia   . Inguinal hernia, left 01/2015  . Muscle spasm   . Open wound of genital labia   . Peripheral neuropathy   . RBBB   . Seizures ([place])   . Sepsis ([place]) 01/19/2018  . Sinus tachycardia    a. persistent since 2009.  Marland Kitchen Smokers' cough ([place])   . Stroke [facility]) 1989   left sided weakness  . TIA (transient ischemic attack)   . Tobacco abuse   . Vulvar abscess 01/23/2018    Tobacco History: Social History   Tobacco Use  Smoking Status Current Every Day Smoker  . Packs/day: 0.50  . Years: 36.00  . Pack years: 18.00  . Types: Cigarettes  . Start date: 07/11/1981  Smokeless Tobacco Never Used  Tobacco Comment   1 ppd now   Ready to quit: Not Answered Counseling given: Not Answered Comment: 1 ppd now   Continue to not smoke  Outpatient Encounter Medications as of 01/31/2020  Medication Sig  . acetaminophen (TYLENOL)  500 MG tablet Take 1,000 mg by mouth every 6 (six) hours as needed for moderate pain.   Marland Kitchen albuterol (VENTOLIN HFA) 108 (90 Base) MCG/ACT inhaler Inhale 2 puffs into the lungs every 6 (six) hours as needed for wheezing or shortness of breath.  . ALPRAZolam (XANAX) 1 MG tablet Take 1 mg by mouth 4 (four) times daily as needed for anxiety.   Marland Kitchen aspirin EC 81 MG tablet Take 81 mg by mouth daily before breakfast.   . budesonide-formoterol (SYMBICORT) 80-4.5 MCG/ACT inhaler INHALE 2 PUFFS BY MOUTH EVERY 12 HOURS TO PREVENT COUGH OR WHEEZING *RINSE MOUTH AFTER EACH USE* (Patient taking differently: Inhale 2 puffs into the lungs in the morning and at bedtime. )  . busPIRone (BUSPAR) 30 MG tablet Take 30 mg by mouth 2 (two) times daily.  . citalopram (CELEXA) 20 MG tablet Take 1 tablet (20 mg total) by mouth daily.  . Dapagliflozin-metFORMIN HCl ER 5-500 MG TB24 Take 1 tablet by mouth daily with breakfast.  . famotidine (PEPCID) 20 MG tablet TAKE 1 TABLET (20 MG TOTAL) BY MOUTH 2 (TWO) TIMES DAILY. FOR HEARTBURN. (Patient taking differently: Take 20 mg by mouth 2 (two) times daily. )  . FLUoxetine (PROZAC) 40 MG capsule Take 40 mg by mouth daily.   . Insulin Pen Needle 32G X 4 MM MISC Used to give insulin injections twice daily.  . insulin regular human CONCENTRATED (HUMULIN R) 500 UNIT/ML injection Inject 30 Units into the skin 3 (three) times daily with meals.   . Insulin Syringe-Needle U-100 (INSULIN SYRINGE 1CC/30GX1/2") 30G X 1/2" 1 ML MISC 1 each by Does not apply  route 2 (two) times daily. E11.9  . ipratropium-albuterol (DUONEB) 0.5-2.5 (3) MG/3ML SOLN Take 3 mLs by nebulization every 4 (four) hours as needed (wheezing/shortness of breath).  . metolazone (ZAROXOLYN) 5 MG tablet Take 5 mg by mouth daily.   . nicotine (NICODERM CQ - DOSED IN MG/24 HOURS) 21 mg/24hr patch Place 1 patch (21 mg total) onto the skin daily.  . ondansetron (ZOFRAN) 4 MG tablet Take 4 mg by mouth 2 (two) times daily.   Glory Rosebush Delica Lancets 12Y MISC 1 each by Does not apply route 3 (three) times daily. Use to monitor glucose levels three times daily; E11.9; E10.42  . ONETOUCH VERIO test strip USE AS DIRECTED TO CHECK BLOOD SUGAR 3 TIMES DAILY  . OXYGEN Inhale 2-4 L into the lungs 2 (two) times daily as needed (shortness of breath).   . pregabalin (LYRICA) 300 MG capsule Take 1 capsule (300 mg total) by mouth 2 (two) times daily. For neuropathy.  Marland Kitchen rOPINIRole (REQUIP) 1 MG tablet TAKE 1 TABLET BY MOUTH EVERY NIGHT AT BEDTIME FOR RESTLESS LEGS. (Patient taking differently: Take 1 mg by mouth at bedtime. )  . rosuvastatin (CRESTOR) 10 MG tablet Take 1 tablet (10 mg total) by mouth daily. (Patient taking differently: Take 10 mg by mouth at bedtime. )  . spironolactone (ALDACTONE) 50 MG tablet TAKE 1 TABLET BY MOUTH ONCE A DAY (Patient taking differently: Take 50 mg by mouth daily. )  . tamoxifen (NOLVADEX) 20 MG tablet Take 1 tablet (20 mg total) by mouth daily.  Marland Kitchen torsemide (DEMADEX) 20 MG tablet TAKE 1 TABLET BY MOUTH 4 TIMES DAILY (Patient taking differently: Take 20 mg by mouth 2 (two) times daily. )  . traZODone (DESYREL) 100 MG tablet Take 200 mg by mouth at bedtime.   Marland Kitchen ULTICARE MINI PEN NEEDLES 31G X 6 MM MISC USE AS DIRECTED THREE TIMES DAILY   No facility-administered encounter medications on file as of 01/31/2020.     Review of Systems  Review of Systems   Physical Exam  There were no vitals taken for this visit.  Wt Readings from Last 5 Encounters:  01/29/20 279 lb 15.8 oz (127 kg)  01/28/20 280 lb (127 kg)  01/23/20 279 lb (126.6 kg)  01/14/20 285 lb (129.3 kg)  01/09/20 283 lb (128.4 kg)    BMI Readings from Last 5 Encounters:  01/29/20 48.06 kg/m  01/28/20 48.06 kg/m  01/23/20 47.89 kg/m  01/14/20 48.92 kg/m  01/09/20 48.58 kg/m     Physical Exam    Assessment & Plan:   No problem-specific Assessment & Plan notes found for this encounter.    No follow-ups on  file.   Lauraine Rinne, NP 01/31/2020   This appointment required *** minutes of patient care (this includes precharting, chart review, review of results, face-to-face care, etc.).

## 2020-02-03 ENCOUNTER — Ambulatory Visit (INDEPENDENT_AMBULATORY_CARE_PROVIDER_SITE_OTHER): Payer: BC Managed Care – PPO | Admitting: Physician Assistant

## 2020-02-03 ENCOUNTER — Other Ambulatory Visit: Payer: Self-pay

## 2020-02-03 ENCOUNTER — Encounter: Payer: Self-pay | Admitting: Physician Assistant

## 2020-02-03 VITALS — BP 121/57 | HR 85 | Temp 98.1°F | Resp 14 | Ht 64.0 in | Wt 279.0 lb

## 2020-02-03 DIAGNOSIS — Z09 Encounter for follow-up examination after completed treatment for conditions other than malignant neoplasm: Secondary | ICD-10-CM | POA: Diagnosis not present

## 2020-02-03 DIAGNOSIS — T8131XA Disruption of external operation (surgical) wound, not elsewhere classified, initial encounter: Secondary | ICD-10-CM | POA: Diagnosis not present

## 2020-02-03 DIAGNOSIS — Z9011 Acquired absence of right breast and nipple: Secondary | ICD-10-CM

## 2020-02-03 MED ORDER — OXYCODONE HCL 5 MG PO CAPS: 5.0000 mg | ORAL_CAPSULE | Freq: Four times a day (QID) | ORAL | 0 refills | Status: DC | PRN

## 2020-02-03 NOTE — Progress Notes (Addendum)
[facility] POST-OP OFFICE VISIT  02/03/2020  HPI: [name] is a 49 y.o. female 5 days s/p excisional debridement of right chest pall and application of wound vac secondary to right mastectomy wound dehiscence with Dr Christian Mate.  Nothing new from Friday's visit Here for wound vac change  Vital signs: BP (!) 121/57   Pulse 85   Temp 98.1 F (36.7 C)   Resp 14   Ht 5\' 4"  (1.626 m)   Wt (!) 279 lb (126.6 kg)   LMP 11/20/2019   SpO2 98%   BMI 47.89 kg/m    Physical Exam: Constitutional: Well appearing female, NAD Skin: Large wound to the right chest wall from previous mastectomy, the wound bed is almost 100% granulation tissue, no purulence, no erythema  Assessment/Plan: This is a 49 y.o. female 5 days s/p excisional debridement of right chest pall and application of wound vac secondary to right mastectomy wound dehiscence with Dr Christian Mate.   -  I completed wound vac change this afternoon at bedside. She tolerated this well. She will continue to have these changed MWF. I will be happy to follow her for this for now. There are plans to involve the wound care center starting in August.   - rtc on Wednesday for wound vac change  -- Edison Simon, PA-C [facility] 02/03/2020, 11:51 AM [phone] M-F: 7am - 4pm

## 2020-02-03 NOTE — Addendum Note (Signed)
Addended by: Edison Simon R on: 02/03/2020 11:51 AM   Modules accepted: Orders

## 2020-02-05 ENCOUNTER — Ambulatory Visit (INDEPENDENT_AMBULATORY_CARE_PROVIDER_SITE_OTHER): Payer: BC Managed Care – PPO | Admitting: Physician Assistant

## 2020-02-05 ENCOUNTER — Encounter: Payer: Self-pay | Admitting: Physician Assistant

## 2020-02-05 ENCOUNTER — Other Ambulatory Visit: Payer: Self-pay

## 2020-02-05 VITALS — BP 99/63 | HR 81 | Temp 98.6°F | Resp 12

## 2020-02-05 DIAGNOSIS — J9610 Chronic respiratory failure, unspecified whether with hypoxia or hypercapnia: Secondary | ICD-10-CM | POA: Diagnosis not present

## 2020-02-05 DIAGNOSIS — Z9011 Acquired absence of right breast and nipple: Secondary | ICD-10-CM

## 2020-02-05 DIAGNOSIS — Z09 Encounter for follow-up examination after completed treatment for conditions other than malignant neoplasm: Secondary | ICD-10-CM | POA: Diagnosis not present

## 2020-02-05 NOTE — Patient Instructions (Signed)
Zach changed your wound vac today. See your appointment to come back Friday for a wound vac change.

## 2020-02-05 NOTE — Progress Notes (Signed)
[facility] POST-OP OFFICE VISIT  02/05/2020  HPI: [name] is a 49 y.o. female 7 days s/p excisional debridement of right chest pall and application of wound vac secondary to right mastectomy wound dehiscence with Dr Christian Mate.  Nothing new from Friday's visit Here for wound vac change  Vital signs: BP (!) 99/63   Pulse 81   Temp 98.6 F (37 C)   Resp 12   SpO2 95%    Physical Exam: Constitutional: Well appearing female, NAD Skin:Large wound to the right chest wall from previous mastectomy, the wound bed is almost 100% granulation tissue, no purulence, no erythema  Assessment/Plan: This is a 49 y.o. female 7 days s/p excisional debridement of right chest pall and application of wound vac secondary to right mastectomy wound dehiscence    - I completed wound vac change this afternoon at bedside. She tolerated this well. She will continue to have these changed MWF. I will be happy to follow her for this for now. There are plans to involve the wound care center starting in August.              - rtc on Wednesday for wound vac change  -- Edison Simon, PA-C [facility] 02/05/2020, 1:25 PM [phone] M-F: 7am - 4pm

## 2020-02-07 ENCOUNTER — Ambulatory Visit (INDEPENDENT_AMBULATORY_CARE_PROVIDER_SITE_OTHER): Payer: Self-pay | Admitting: Physician Assistant

## 2020-02-07 ENCOUNTER — Encounter: Payer: Self-pay | Admitting: Physician Assistant

## 2020-02-07 ENCOUNTER — Other Ambulatory Visit: Payer: Self-pay

## 2020-02-07 VITALS — BP 115/64 | HR 82 | Temp 98.4°F

## 2020-02-07 DIAGNOSIS — Z9011 Acquired absence of right breast and nipple: Secondary | ICD-10-CM

## 2020-02-07 DIAGNOSIS — Z09 Encounter for follow-up examination after completed treatment for conditions other than malignant neoplasm: Secondary | ICD-10-CM

## 2020-02-07 NOTE — Patient Instructions (Signed)
Call and cancel your wound clinic appointment.  Every day change the dressing and packing. Wet the gauze with saline. Twist and tuck it into the wound in layers. Put the ABD pad over and tape in place.  You may use Tylenol with your Oxycodone for pain control.   Follow up her next Friday.

## 2020-02-07 NOTE — Progress Notes (Signed)
[facility] POST-OP OFFICE VISIT  02/07/2020  HPI: [name] is a 49 y.o. female 9 days s/p excisional debridement of right chest pall and application of wound vac secondary to right mastectomy wound dehiscence with Dr Christian Mate.  She continues to have skin irritation and redness, which I suspect is secondary to the drape from the wound vac No fever, chills No other new issues  Vital signs: BP (!) 115/64   Pulse 82   Temp 98.4 F (36.9 C)   LMP 01/05/2020   SpO2 90%    Physical Exam: Constitutional: Well appearing female, NAD Skin:Large wound to the right chest wall from previous mastectomy, today this measures about 15 x 3 x 3 cm, the wound bed is almost 100% granulation tissue, no purulence. The surrounding skin is macerated, I suspect secondary to the wound vac drape   Assessment/Plan: This is a 49 y.o. female 9 days s/p excisional debridement of right chest pall and application of wound vac secondary to right mastectomy wound dehiscence with Dr Christian Mate.   - I removed the wound vac at bedside today. I believe she is having significant skin irritation from wound vac draping, and I believe she would benefit from a holiday from the wound vac. I switched her to wet-to-dry dressings with saline moistened Kerlix gauze. I walked her husband through dressing changes and he feels comfortable with this at home. Supplies were provided.   - rtc in 1 week for wound check. If her skin is improved then we can transition back to wound vac.   - I think it is okay holding off on going to the wound care center for now   -- Edison Simon, PA-C [facility] 02/07/2020, 11:21 AM [phone] M-F: 7am - 4pm

## 2020-02-10 ENCOUNTER — Inpatient Hospital Stay: Payer: BC Managed Care – PPO | Admitting: Internal Medicine

## 2020-02-10 ENCOUNTER — Other Ambulatory Visit: Payer: Self-pay | Admitting: Primary Care

## 2020-02-10 ENCOUNTER — Ambulatory Visit: Payer: BC Managed Care – PPO | Admitting: Physician Assistant

## 2020-02-10 DIAGNOSIS — E1042 Type 1 diabetes mellitus with diabetic polyneuropathy: Secondary | ICD-10-CM

## 2020-02-10 DIAGNOSIS — M792 Neuralgia and neuritis, unspecified: Secondary | ICD-10-CM

## 2020-02-10 DIAGNOSIS — G629 Polyneuropathy, unspecified: Secondary | ICD-10-CM

## 2020-02-10 NOTE — Assessment & Plan Note (Deleted)
#  Early stage breast cancer clinical stage I-II [T2 N0] ER/PR positive HER-2 negative; surgery curently on hold sec to medical co-morbidties/ see below. Ok to start Tamoxifen "neo-adjuvant"-discussed the rationale of starting tamoxifen.  Discussed the mechanism of action potential side effects include but not limited to hot flashes joint pains etc.  # COPD exacerbation/symptomatic hold off steroids.  No signs of active infection.- 88 on RA; improved on 93 on 3 lits. Recommend nebs q 4 hours; follow up PCP/pulmonary.  Continue Advair albuterol.  Chest x-ray-December 16, 2019 reviewed; no acute process noted.  No concern for PE.  #Poorly controlled diabetes-on insulin; followed endocrinology.  #Peripheral neuropathy grade 2-3; patient goes on with a walker/cane at baseline.  Continue Lyrica.  Stable  #Stage III kidney disease-second diabetes-overall stable.  DISPOSITION: # follow up in 3 weeks; MDL labs- cbc/[id number];cea; ca-15-3- Dr.B  

## 2020-02-10 NOTE — Telephone Encounter (Signed)
Last prescribed on 09/24/2019 Last OV (video visit ) with Allie Bossier on 12/31/2019 No future OV scheduled

## 2020-02-10 NOTE — Telephone Encounter (Signed)
Refills sent to pharmacy. 

## 2020-02-10 NOTE — Progress Notes (Deleted)
[facility] [place] NOTE  Patient Care Team: Pleas Koch, NP as PCP - General (Internal Medicine) Rosamaria Lints, MD (Inactive) as Referring Physician (Neurology) Alvester Chou, NP (Nurse Practitioner) Renato Shin, MD as Consulting Physician (Endocrinology) Theodore Demark, RN as Oncology Nurse Navigator Ronny Bacon, MD as Consulting Physician (General Surgery)  CHIEF COMPLAINTS/PURPOSE OF CONSULTATION: Breast cancer  #  Oncology History Overview Note  #April 2021-2.5 cm right breast mass [incidental-CT scan chest]  # April 2021- RIGHT BREAST [facility]; s/p ER- 51-90%; PR- 51-90%; Her- 2-NEG. G-2. [Dr.Rodenberg]  # DM- poorly controlled; Seizure disorder/Bipolar/TIA ; Diabetes PN- G-3 [cane/walker; falls]; COPD; OSA active smoker  # SURVIVORSHIP:   # GENETICS:   DIAGNOSIS:   STAGE:         ;  GOALS:  CURRENT/MOST RECENT THERAPY :      Carcinoma of lower-outer quadrant of right breast in female, estrogen receptor positive ([place])  11/12/2019 Initial Diagnosis   Carcinoma of lower-outer quadrant of right breast in female, estrogen receptor positive ([place])      HISTORY OF PRESENTING ILLNESS:  [name] 49 y.o.  female  with multiple medical problems include history of TIA, bipolar disorder morbid obesity poorly controlled diabetes with neuropathy-recently diagnosed breast cancer stage II ER/PR positive HER-2 negative.  Patient was evaluated by surgery; however surgery had to be on hold because of patient's comorbidities-poorly controlled diabetes; COPD etc.  Patient was evaluated in the emergency room yesterday for shortness of breath fatigue/confusion.-Patient had appropriate work-up without any causes.  Patient continues to have worsening fatigue.  Worsening cough.  Worsening shortness of breath.  She is currently on oxygen only at nighttime.  At 2 L.  Review of Systems  Constitutional: Negative for chills, diaphoresis, fever, malaise/fatigue and  weight loss.  HENT: Negative for nosebleeds and sore throat.   Eyes: Negative for double vision.  Respiratory: Positive for cough and shortness of breath. Negative for hemoptysis, sputum production and wheezing.   Cardiovascular: Negative for chest pain, palpitations, orthopnea and leg swelling.  Gastrointestinal: Negative for abdominal pain, blood in stool, constipation, diarrhea, heartburn, melena, nausea and vomiting.  Genitourinary: Negative for dysuria, frequency and urgency.  Musculoskeletal: Positive for back pain and joint pain.  Skin: Negative.  Negative for itching and rash.  Neurological: Positive for tingling. Negative for dizziness, focal weakness, weakness and headaches.  Endo/Heme/Allergies: Does not bruise/bleed easily.  Psychiatric/Behavioral: Negative for depression. The patient is nervous/anxious and has insomnia.      MEDICAL HISTORY:  Past Medical History:  Diagnosis Date  . Anxiety    takes Prozac daily  . Anxiety   . Aortic valve calcification   . Asthma    Advair and Spirva daily  . Asthma   . Bipolar disorder ([place])   . CAD (coronary artery disease)    a. LHC 11/2013 done for CP/fluid retention: mild disease in prox LAD, mild-mod disease in mRCA, EF 60% with normal LVEDP. b. Normal nuc 03/2016.  Marland Kitchen CHF (congestive heart failure) ([place])   . Chronic diastolic CHF (congestive heart failure) ([place])   . Chronic heart failure with preserved ejection fraction ([place]) 11/16/2018  . CKD (chronic kidney disease), stage II   . COPD (chronic obstructive pulmonary disease) (H[facility])    a. nocturnal O2.  Marland Kitchen COPD (chronic obstructive pulmonary disease) ([place])   . Coronary artery disease   . Decreased urine stream   . Diabetes mellitus   . Diabetes mellitus without complication ([place])   . Dyspnea   .  Family history of adverse reaction to anesthesia    mom gets nauseated  . GERD (gastroesophageal reflux disease)    takes Pepcid daily  . History of blood clots    left leg 3-23yr ago   . Hyperlipidemia   . Hypertension   . Hypertriglyceridemia   . Inguinal hernia, left 01/2015  . Muscle spasm   . Open wound of genital labia   . Peripheral neuropathy   . RBBB   . Seizures ([facility]   . Sepsis ([place] 01/19/2018  . Sinus tachycardia    a. persistent since 2009.  .Marland KitchenSmokers' cough ([place]   . Stroke ([facility] 1989   left sided weakness  . TIA (transient ischemic attack)   . Tobacco abuse   . Vulvar abscess 01/23/2018    SURGICAL HISTORY: Past Surgical History:  Procedure Laterality Date  . APPLICATION OF WOUND VAC Right 01/29/2020   Procedure: APPLICATION OF WOUND VAC;  Surgeon: RRonny Bacon MD;  Location: [facility] ORS;  Service: General;  Laterality: Right;  . BREAST BIOPSY Right 11/06/2019   u[place]oreacore path pending venus clip  . HERNIA REPAIR Left   . INCISION AND DRAINAGE ABSCESS N/A 01/26/2018   Procedure: INCISION AND DEBRIDEMENT OF VULVAR NECROTIZING SOFT TISSUE INFECTION;  Surgeon: WGreer Pickerel MD;  [place] [facility]  Service: General;  Laterality: N/A;  . INCISION AND DRAINAGE PERIRECTAL ABSCESS N/A 01/22/2018   Procedure: IRRIGATION AND DEBRIDEMENT LABIAL/VULVAR AREA;  Surgeon: BCoralie Keens MD;  [place]  Service: General;  Laterality: N/A;  . INCISION AND DRAINAGE PERIRECTAL ABSCESS N/A 01/29/2018   Procedure: IRRIGATION AND DEBRIDEMENT VULVA;  Surgeon: HExcell Seltzer MD;  [place]: [facility]  Service: General;  Laterality: N/A;  . INGUINAL HERNIA REPAIR Left 04/08/2015   Procedure: OPEN LEFT INGUINAL HERNIA REPAIR WITH MESH;  Surgeon: ARalene Ok MD;  [place] [facility]  Service: General;  Laterality: Left;  . INSERTION OF MESH Left 04/08/2015   Procedure: INSERTION OF MESH;  Surgeon: ARalene Ok MD;  [place] [facility]  Service: General;  Laterality: Left;  . LAPAROSCOPY     Endometriosis  . LEFT HEART CATHETERIZATION WITH CORONARY ANGIOGRAM N/A 12/05/2013   Procedure: LEFT HEART CATHETERIZATION WITH CORONARY ANGIOGRAM;  Surgeon: JJettie Booze MD;  [facility];  Service: Cardiovascular;  Laterality: N/A;  . right kidney drained    . SIMPLE MASTECTOMY WITH AXILLARY SENTINEL NODE BIOPSY Right 12/23/2019   Procedure: Right SIMPLE MASTECTOMY WITH AXILLARY SENTINEL NODE BIOPSY;  Surgeon: RRonny Bacon MD;  Location: [facility] ORS;  Service: General;  Laterality: Right;  . TEE WITHOUT CARDIOVERSION N/A 11/28/2013   Procedure: TRANSESOPHAGEAL ECHOCARDIOGRAM (TEE);  Surgeon: PThayer Headings MD;  [place] [facility]  Service: Cardiovascular;  Laterality: N/A;  . WOUND DEBRIDEMENT Right 01/29/2020   Procedure: DEBRIDEMENT WOUND, Excisional debridement skin, subcutaneous and muscle right chest wall;  Surgeon: RRonny Bacon MD;  Location: [facility] ORS;  Service: General;  Laterality: Right;    SOCIAL HISTORY: Social History   Socioeconomic History  . Marital status: Married    Spouse name: Not on file  . Number of children: 2  . Years of education: Not on file  . Highest education level: Not on file  Occupational History  . Not on file  Tobacco Use  . Smoking status: Current Some Day Smoker    Packs/day: 0.50    Years: 36.00    Pack years: 18.00    Types: Cigarettes    Start date: 07/11/1981  .  Smokeless tobacco: Never Used  . Tobacco comment: 1 ppd now  Vaping Use  . Vaping Use: Some days  Substance and Sexual Activity  . Alcohol use: No  . Drug use: No  . Sexual activity: Not Currently  Other Topics Concern  . Not on file  Social History Narrative   ** Merged History Encounter **       Lives in [place]   Married but lives with Boyfriend - not legally separated   Disabled - for BiPolar, Seizure disorder, diabetes   Formerly worked at [employer] 1 ppd; no alcohol. 2 step sons; no biologic children.       Social Determinants of Health   Financial Resource Strain:   . Difficulty of Paying Living Expenses:   Food Insecurity:   . Worried About Charity fundraiser in the Last Year:   . Arts development officer in the Last Year:   Transportation Needs:   . Film/video editor (Medical):   Marland Kitchen Lack of Transportation (Non-Medical):   Physical Activity:   . Days of Exercise per Week:   . Minutes of Exercise per Session:   Stress:   . Feeling of Stress :   Social Connections:   . Frequency of Communication with Friends and Family:   . Frequency of Social Gatherings with Friends and Family:   . Attends Religious Services:   . Active Member of Clubs or Organizations:   . Attends Archivist Meetings:   Marland Kitchen Marital Status:   Intimate Partner Violence:   . Fear of Current or Ex-Partner:   . Emotionally Abused:   Marland Kitchen Physically Abused:   . Sexually Abused:     FAMILY HISTORY: Family History  Problem Relation Age of Onset  . Venous thrombosis Brother   . Other Brother        BRAIN TUMOR  . Asthma Father   . Diabetes Father   . Coronary artery disease Mother   . Hypertension Mother   . Diabetes Mother   . Breast cancer Mother 71  . Asthma Sister   . Diabetes type II Brother     ALLERGIES:  is allergic to adhesive [tape], metoprolol, montelukast, morphine sulfate, penicillins, prednisone, diltiazem, gabapentin, and midodrine.  MEDICATIONS:  Current Outpatient Medications  Medication Sig Dispense Refill  . acetaminophen (TYLENOL) 500 MG tablet Take 1,000 mg by mouth every 6 (six) hours as needed for moderate pain.     Marland Kitchen albuterol (VENTOLIN HFA) 108 (90 Base) MCG/ACT inhaler Inhale 2 puffs into the lungs every 6 (six) hours as needed for wheezing or shortness of breath.    . ALPRAZolam (XANAX) 1 MG tablet Take 1 mg by mouth 4 (four) times daily as needed for anxiety.     Marland Kitchen aspirin EC 81 MG tablet Take 81 mg by mouth daily before breakfast.     . budesonide-formoterol (SYMBICORT) 80-4.5 MCG/ACT inhaler INHALE 2 PUFFS BY MOUTH EVERY 12 HOURS TO PREVENT COUGH OR WHEEZING *RINSE MOUTH AFTER EACH USE* (Patient taking differently: Inhale 2 puffs into the lungs in the morning  and at bedtime. ) 10.2 g 3  . busPIRone (BUSPAR) 30 MG tablet Take 30 mg by mouth 2 (two) times daily.    . citalopram (CELEXA) 20 MG tablet Take 1 tablet (20 mg total) by mouth daily. 30 tablet 2  . Dapagliflozin-metFORMIN HCl ER 5-500 MG TB24 Take 1 tablet by mouth daily with breakfast.    . famotidine (  PEPCID) 20 MG tablet TAKE 1 TABLET (20 MG TOTAL) BY MOUTH 2 (TWO) TIMES DAILY. FOR HEARTBURN. (Patient taking differently: Take 20 mg by mouth 2 (two) times daily. ) 180 tablet 1  . FLUoxetine (PROZAC) 40 MG capsule Take 40 mg by mouth daily.     . Insulin Pen Needle 32G X 4 MM MISC Used to give insulin injections twice daily. 100 each 11  . insulin regular human CONCENTRATED (HUMULIN R) 500 UNIT/ML injection Inject 30 Units into the skin 3 (three) times daily with meals.     . Insulin Syringe-Needle U-100 (INSULIN SYRINGE 1CC/30GX1/2") 30G X 1/2" 1 ML MISC 1 each by Does not apply route 2 (two) times daily. E11.9 200 each 0  . ipratropium-albuterol (DUONEB) 0.5-2.5 (3) MG/3ML SOLN Take 3 mLs by nebulization every 4 (four) hours as needed (wheezing/shortness of breath). 360 mL 0  . metolazone (ZAROXOLYN) 5 MG tablet Take 5 mg by mouth daily.     . nicotine (NICODERM CQ - DOSED IN MG/24 HOURS) 21 mg/24hr patch Place 1 patch (21 mg total) onto the skin daily. 28 patch 0  . ondansetron (ZOFRAN) 4 MG tablet Take 4 mg by mouth 2 (two) times daily.     Glory Rosebush Delica Lancets 81O MISC 1 each by Does not apply route 3 (three) times daily. Use to monitor glucose levels three times daily; E11.9; E10.42 100 each 11  . ONETOUCH VERIO test strip USE AS DIRECTED TO CHECK BLOOD SUGAR 3 TIMES DAILY 100 each 0  . oxycodone (OXY-IR) 5 MG capsule Take 1 capsule (5 mg total) by mouth every 6 (six) hours as needed for pain (Dressing changes). 30 capsule 0  . OXYGEN Inhale 2-4 L into the lungs 2 (two) times daily as needed (shortness of breath).     . pregabalin (LYRICA) 300 MG capsule Take 1 capsule (300 mg total)  by mouth 2 (two) times daily. For neuropathy. 180 capsule 0  . rOPINIRole (REQUIP) 1 MG tablet TAKE 1 TABLET BY MOUTH EVERY NIGHT AT BEDTIME FOR RESTLESS LEGS. (Patient taking differently: Take 1 mg by mouth at bedtime. ) 90 tablet 1  . rosuvastatin (CRESTOR) 10 MG tablet Take 1 tablet (10 mg total) by mouth daily. (Patient taking differently: Take 10 mg by mouth at bedtime. ) 90 tablet 3  . spironolactone (ALDACTONE) 50 MG tablet TAKE 1 TABLET BY MOUTH ONCE A DAY (Patient taking differently: Take 50 mg by mouth daily. ) 90 tablet 1  . tamoxifen (NOLVADEX) 20 MG tablet Take 1 tablet (20 mg total) by mouth daily. 30 tablet 1  . torsemide (DEMADEX) 20 MG tablet TAKE 1 TABLET BY MOUTH 4 TIMES DAILY (Patient taking differently: Take 20 mg by mouth 2 (two) times daily. ) 360 tablet 3  . traZODone (DESYREL) 100 MG tablet Take 200 mg by mouth at bedtime.     Marland Kitchen ULTICARE MINI PEN NEEDLES 31G X 6 MM MISC USE AS DIRECTED THREE TIMES DAILY 100 each 0   No current facility-administered medications for this visit.      Marland Kitchen  PHYSICAL EXAMINATION: ECOG PERFORMANCE STATUS: 0 - Asymptomatic  There were no vitals filed for this visit. There were no vitals filed for this visit.  Physical Exam Constitutional:      Comments: Patient is obese.  She is in a wheelchair.  Accompanied by husband.  She smells nicotine.  HENT:     Head: Normocephalic and atraumatic.     Mouth/Throat:  Pharynx: No oropharyngeal exudate.  Eyes:     Pupils: Pupils are equal, round, and reactive to light.  Cardiovascular:     Rate and Rhythm: Normal rate and regular rhythm.  Pulmonary:     Effort: No respiratory distress.     Breath sounds: No wheezing.  Abdominal:     General: Bowel sounds are normal. There is no distension.     Palpations: Abdomen is soft. There is no mass.     Tenderness: There is no abdominal tenderness. There is no guarding or rebound.  Musculoskeletal:        General: No tenderness. Normal range of  motion.     Cervical back: Normal range of motion and neck supple.  Skin:    General: Skin is warm.  Neurological:     Mental Status: She is alert and oriented to person, place, and time.  Psychiatric:        Mood and Affect: Affect normal.     Comments: Anxious.      LABORATORY DATA:  I have reviewed the data as listed Lab Results  Component Value Date   WBC 11.3 (H) 01/28/2020   HGB 11.6 (L) 01/28/2020   HCT 37.7 01/28/2020   MCV 82.9 01/28/2020   PLT 222 01/28/2020   Recent Labs    09/23/19 1732 10/23/19 1619 11/27/19 2214 11/27/19 2214 12/16/19 1035 12/16/19 1035 12/24/19 0531 12/25/19 0433 01/28/20 0911  NA  --    < > 133*   < > 134*   < > 130* 135 132*  K  --    < > 4.4   < > 3.8   < > 4.3 4.5 3.8  CL  --    < > 95*   < > 89*   < > 88* 95* 90*  CO2  --    < > 26   < > 31   < > '27 30 29  '$ GLUCOSE  --    < > 407*   < > 183*   < > 494* 388* 395*  BUN  --    < > 57*   < > 60*   < > 70* 62* 35*  CREATININE  --    < > 1.79*   < > 1.93*   < > 2.21* 1.60* 1.42*  CALCIUM  --    < > 8.8*   < > 9.2   < > 8.7* 8.8* 8.6*  GFRNONAA  --    < > 32*   < > 30*   < > 25* 37* 43*  GFRAA  --    < > 38*   < > 34*   < > 29* 43* 49*  PROT 8.8*   < > 8.8*  --  8.8*  --   --   --  8.6*  ALBUMIN 4.0   < > 3.7  --  3.6  --   --   --  3.5  AST 40   < > 34  --  27  --   --   --  16  ALT 44   < > 35  --  26  --   --   --  19  ALKPHOS 125   < > 116  --  114  --   --   --  124  BILITOT 1.1   < > 1.0  --  1.2  --   --   --  1.3*  BILIDIR 0.2  --   --   --   --   --   --   --   --  IBILI 0.9  --   --   --   --   --   --   --   --    < > = values in this interval not displayed.    RADIOGRAPHIC STUDIES: I have personally reviewed the radiological images as listed and agreed with the findings in the report. No results found.  ASSESSMENT & PLAN:   No problem-specific Assessment & Plan notes found for this encounter.  # 40 minutes face-to-face with the patient discussing the above plan  of care; more than 50% of time spent on prognosis/ natural history; counseling and coordination.   All questions were answered. The patient/family knows to call the clinic with any problems, questions or concerns.    Cammie Sickle, MD 02/10/2020 1:18 PM

## 2020-02-12 ENCOUNTER — Inpatient Hospital Stay: Payer: BC Managed Care – PPO | Admitting: Internal Medicine

## 2020-02-12 ENCOUNTER — Telehealth: Payer: Self-pay | Admitting: *Deleted

## 2020-02-12 ENCOUNTER — Telehealth: Payer: Self-pay | Admitting: Surgery

## 2020-02-12 NOTE — Telephone Encounter (Signed)
Patient r/s to 02/24/2020 per scheduler.  I spoke with Vita Barley, RN. She will contact the patient to review the importance of keeping her apts.

## 2020-02-12 NOTE — Telephone Encounter (Signed)
Patient no showed for her appointments today. Contacted patient and left vm for patient to return our phone call.

## 2020-02-12 NOTE — Telephone Encounter (Signed)
Patient is calling again and is asking for someone to give her a call. Please call patient and advise.

## 2020-02-12 NOTE — Telephone Encounter (Signed)
Patient called twice today. Patient is asking for Vicodin 5 mg. The Tylenol is not helping. Patient added to 02/13/20 schedule.

## 2020-02-12 NOTE — Telephone Encounter (Signed)
Patient calls stating that she is having a lot of pain at the wound site.  States that her dressing changes are doing okay, but the pain is keeping her up and can't get any rest.  No fever or vomiting.   Asking for someone to please call her.

## 2020-02-13 ENCOUNTER — Encounter: Payer: Self-pay | Admitting: Physician Assistant

## 2020-02-14 ENCOUNTER — Ambulatory Visit (INDEPENDENT_AMBULATORY_CARE_PROVIDER_SITE_OTHER): Payer: Self-pay | Admitting: Physician Assistant

## 2020-02-14 ENCOUNTER — Ambulatory Visit: Payer: BC Managed Care – PPO | Admitting: Cardiology

## 2020-02-14 ENCOUNTER — Other Ambulatory Visit: Payer: Self-pay

## 2020-02-14 ENCOUNTER — Encounter: Payer: Self-pay | Admitting: Physician Assistant

## 2020-02-14 VITALS — BP 105/67 | HR 81 | Temp 98.0°F

## 2020-02-14 DIAGNOSIS — Z9011 Acquired absence of right breast and nipple: Secondary | ICD-10-CM

## 2020-02-14 DIAGNOSIS — Z09 Encounter for follow-up examination after completed treatment for conditions other than malignant neoplasm: Secondary | ICD-10-CM

## 2020-02-14 MED ORDER — FLUCONAZOLE 100 MG PO TABS
100.0000 mg | ORAL_TABLET | Freq: Every day | ORAL | 0 refills | Status: DC
Start: 2020-02-14 — End: 2020-07-01

## 2020-02-14 MED ORDER — OXYCODONE HCL 5 MG PO CAPS: 5.0000 mg | ORAL_CAPSULE | Freq: Four times a day (QID) | ORAL | 0 refills | Status: DC | PRN

## 2020-02-14 NOTE — Progress Notes (Signed)
[facility] POST-OP OFFICE VISIT  02/14/2020  HPI: [name] is a 49 y.o. female 16 days s/p excisional debridement of right chest pall and application of wound vac secondary to right mastectomy wound dehiscence with Dr Christian Mate.  She is overall doing well but continues to endorse pain with dressing changes No fever or chills Her skin continues to be irritated   Vital signs: BP 105/67   Pulse 81   Temp 98 F (36.7 C)   SpO2 94%    Physical Exam: Constitutional: Well appearing female, NAD Skin:Large wound to the right chest wall from previous mastectomy, today this measures about 15 x 3 x 3 cm, the wound bed is almost 100% granulation tissue, no purulence. The surrounding skin is macerated and I believe this is yeast infection  Assessment/Plan: This is a 49 y.o. female 16 days s/p excisional debridement of right chest pall and application of wound vac secondary to right mastectomy wound dehiscence   - I will start her on 100 mg Diflucan daily x 2 weeks for yeast infection  - I discussed additional non-narcotic forms of pain control, I will snd in a very short Rx for this for dressing changes only  - Continue daily wet-to-dry dressing changes. We will hold off restarting wound vac for now.   - rtc in 1 week for re-check. I will plan on seeing her weekly for this.   -- Edison Simon, PA-C [facility] 02/14/2020, 11:37 AM [phone] M-F: 7am - 4pm

## 2020-02-14 NOTE — Patient Instructions (Addendum)
You can take 500 mg Tylenol, two of these every 6 hours or one every 4 hours.  Only use the narcotic pain medication about 30 minutes prior to your dressing change along with some ice to the area 10 minutes prior.   We have prescribed you some antifungal medication to help treat the yeast infection.  Continue the wet to dry dressing changes.   Follow up in one week.

## 2020-02-17 ENCOUNTER — Encounter: Payer: Self-pay | Admitting: Podiatry

## 2020-02-17 ENCOUNTER — Other Ambulatory Visit: Payer: Self-pay

## 2020-02-17 ENCOUNTER — Ambulatory Visit (INDEPENDENT_AMBULATORY_CARE_PROVIDER_SITE_OTHER): Payer: BC Managed Care – PPO | Admitting: Podiatry

## 2020-02-17 ENCOUNTER — Encounter: Payer: Self-pay | Admitting: *Deleted

## 2020-02-17 DIAGNOSIS — E1159 Type 2 diabetes mellitus with other circulatory complications: Secondary | ICD-10-CM | POA: Diagnosis not present

## 2020-02-17 DIAGNOSIS — M79675 Pain in left toe(s): Secondary | ICD-10-CM

## 2020-02-17 DIAGNOSIS — M79674 Pain in right toe(s): Secondary | ICD-10-CM | POA: Diagnosis not present

## 2020-02-17 DIAGNOSIS — B351 Tinea unguium: Secondary | ICD-10-CM

## 2020-02-17 NOTE — Progress Notes (Signed)
This patient returns to my office for at risk foot care.  This patient requires this care by a professional since this patient will be at risk due to having  PAD CKD and diabetes.    This patient is unable to cut nails herself since the patient cannot reach her nails.These nails are painful walking and wearing shoes.  This patient presents for at risk foot care today.  General Appearance  Alert, conversant and in no acute stress.  Vascular  Dorsalis pedis and posterior tibial  pulses are weakly  palpable  bilaterally.  Capillary return is within normal limits  bilaterally. Temperature is within normal limits  bilaterally.  Neurologic  Senn-Weinstein monofilament wire test absent   bilaterally. Muscle power within normal limits bilaterally.  Nails Thick disfigured discolored nails with subungual debris  from hallux to fifth toes bilaterally. No evidence of bacterial infection or drainage bilaterally.  Orthopedic  No limitations of motion  feet .  No crepitus or effusions noted.  No bony pathology or digital deformities noted.  Skin  normotropic skin with no porokeratosis noted bilaterally.  No signs of infections or ulcers noted.   Red contusion 4th toe left foot.  Healing.  Onychomycosis  Pain in right toes  Pain in left toes  Consent was obtained for treatment procedures.   Mechanical debridement of nails 1-5  bilaterally performed with a nail nipper.  Filed with dremel without incident.  Neosporin/DSD 4th toe left foot.   Return office visit    3 months                  Told patient to return for periodic foot care and evaluation due to potential at risk complications.   Gardiner Barefoot DPM

## 2020-02-17 NOTE — Telephone Encounter (Signed)
[name] left a message for patient to return my call.  I do see she in follow up for post surgery wound care.

## 2020-02-17 NOTE — Progress Notes (Signed)
Called patient to follow up.  She missed oncology appointment last week.  I do see in her chart that she is undergoing wound care post surgery.  Left message for patient to return my call.

## 2020-02-18 ENCOUNTER — Ambulatory Visit: Payer: BC Managed Care – PPO | Admitting: Pulmonary Disease

## 2020-02-21 ENCOUNTER — Encounter: Payer: BC Managed Care – PPO | Admitting: Physician Assistant

## 2020-02-24 ENCOUNTER — Encounter: Payer: Self-pay | Admitting: *Deleted

## 2020-02-24 ENCOUNTER — Inpatient Hospital Stay: Payer: BC Managed Care – PPO | Admitting: Internal Medicine

## 2020-02-24 ENCOUNTER — Telehealth: Payer: Self-pay | Admitting: Physician Assistant

## 2020-02-24 NOTE — Progress Notes (Deleted)
[facility] [place] NOTE  Patient Care Team: Pleas Koch, NP as PCP - General (Internal Medicine) Rosamaria Lints, MD (Inactive) as Referring Physician (Neurology) Alvester Chou, NP (Nurse Practitioner) Renato Shin, MD as Consulting Physician (Endocrinology) Theodore Demark, RN as Oncology Nurse Navigator Ronny Bacon, MD as Consulting Physician (General Surgery)  CHIEF COMPLAINTS/PURPOSE OF CONSULTATION: Breast cancer  #  Oncology History Overview Note  #April 2021-2.5 cm right breast mass [incidental-CT scan chest]  # April 2021- RIGHT BREAST [facility]; s/p ER- 51-90%; PR- 51-90%; Her- 2-NEG. G-2. [Dr.Rodenberg]  # DM- poorly controlled; Seizure disorder/Bipolar/TIA ; Diabetes PN- G-3 [cane/walker; falls]; COPD; OSA active smoker  # SURVIVORSHIP:   # GENETICS:   DIAGNOSIS:   STAGE:         ;  GOALS:  CURRENT/MOST RECENT THERAPY :      Carcinoma of lower-outer quadrant of right breast in female, estrogen receptor positive ([place])  11/12/2019 Initial Diagnosis   Carcinoma of lower-outer quadrant of right breast in female, estrogen receptor positive ([place])      HISTORY OF PRESENTING ILLNESS:  [name] 49 y.o.  female  with multiple medical problems include history of TIA, bipolar disorder morbid obesity poorly controlled diabetes with neuropathy-recently diagnosed breast cancer stage II ER/PR positive HER-2 negative.  Patient was evaluated by surgery; however surgery had to be on hold because of patient's comorbidities-poorly controlled diabetes; COPD etc.  Patient was evaluated in the emergency room yesterday for shortness of breath fatigue/confusion.-Patient had appropriate work-up without any causes.  Patient continues to have worsening fatigue.  Worsening cough.  Worsening shortness of breath.  She is currently on oxygen only at nighttime.  At 2 L.  Review of Systems  Constitutional: Negative for chills, diaphoresis, fever, malaise/fatigue and  weight loss.  HENT: Negative for nosebleeds and sore throat.   Eyes: Negative for double vision.  Respiratory: Positive for cough and shortness of breath. Negative for hemoptysis, sputum production and wheezing.   Cardiovascular: Negative for chest pain, palpitations, orthopnea and leg swelling.  Gastrointestinal: Negative for abdominal pain, blood in stool, constipation, diarrhea, heartburn, melena, nausea and vomiting.  Genitourinary: Negative for dysuria, frequency and urgency.  Musculoskeletal: Positive for back pain and joint pain.  Skin: Negative.  Negative for itching and rash.  Neurological: Positive for tingling. Negative for dizziness, focal weakness, weakness and headaches.  Endo/Heme/Allergies: Does not bruise/bleed easily.  Psychiatric/Behavioral: Negative for depression. The patient is nervous/anxious and has insomnia.      MEDICAL HISTORY:  Past Medical History:  Diagnosis Date  . Anxiety    takes Prozac daily  . Anxiety   . Aortic valve calcification   . Asthma    Advair and Spirva daily  . Asthma   . Bipolar disorder ([place])   . CAD (coronary artery disease)    a. LHC 11/2013 done for CP/fluid retention: mild disease in prox LAD, mild-mod disease in mRCA, EF 60% with normal LVEDP. b. Normal nuc 03/2016.  Marland Kitchen CHF (congestive heart failure) ([place])   . Chronic diastolic CHF (congestive heart failure) ([place])   . Chronic heart failure with preserved ejection fraction ([place]) 11/16/2018  . CKD (chronic kidney disease), stage II   . COPD (chronic obstructive pulmonary disease) (H[facility])    a. nocturnal O2.  Marland Kitchen COPD (chronic obstructive pulmonary disease) ([place])   . Coronary artery disease   . Decreased urine stream   . Diabetes mellitus   . Diabetes mellitus without complication ([place])   . Dyspnea   .  Family history of adverse reaction to anesthesia    mom gets nauseated  . GERD (gastroesophageal reflux disease)    takes Pepcid daily  . History of blood clots    left leg 3-23yr ago   . Hyperlipidemia   . Hypertension   . Hypertriglyceridemia   . Inguinal hernia, left 01/2015  . Muscle spasm   . Open wound of genital labia   . Peripheral neuropathy   . RBBB   . Seizures ([facility]   . Sepsis ([place] 01/19/2018  . Sinus tachycardia    a. persistent since 2009.  .Marland KitchenSmokers' cough ([place]   . Stroke ([facility] 1989   left sided weakness  . TIA (transient ischemic attack)   . Tobacco abuse   . Vulvar abscess 01/23/2018    SURGICAL HISTORY: Past Surgical History:  Procedure Laterality Date  . APPLICATION OF WOUND VAC Right 01/29/2020   Procedure: APPLICATION OF WOUND VAC;  Surgeon: RRonny Bacon MD;  Location: [facility] ORS;  Service: General;  Laterality: Right;  . BREAST BIOPSY Right 11/06/2019   u[place]oreacore path pending venus clip  . HERNIA REPAIR Left   . INCISION AND DRAINAGE ABSCESS N/A 01/26/2018   Procedure: INCISION AND DEBRIDEMENT OF VULVAR NECROTIZING SOFT TISSUE INFECTION;  Surgeon: WGreer Pickerel MD;  [place] [facility] [place]  Service: General;  Laterality: N/A;  . INCISION AND DRAINAGE PERIRECTAL ABSCESS N/A 01/22/2018   Procedure: IRRIGATION AND DEBRIDEMENT LABIAL/VULVAR AREA;  Surgeon: BCoralie Keens MD;  [place]  Service: General;  Laterality: N/A;  . INCISION AND DRAINAGE PERIRECTAL ABSCESS N/A 01/29/2018   Procedure: IRRIGATION AND DEBRIDEMENT VULVA;  Surgeon: HExcell Seltzer MD;  [place]: [facility]  Service: General;  Laterality: N/A;  . INGUINAL HERNIA REPAIR Left 04/08/2015   Procedure: OPEN LEFT INGUINAL HERNIA REPAIR WITH MESH;  Surgeon: ARalene Ok MD;  [place] [facility]  Service: General;  Laterality: Left;  . INSERTION OF MESH Left 04/08/2015   Procedure: INSERTION OF MESH;  Surgeon: ARalene Ok MD;  [place]: [facility]  Service: General;  Laterality: Left;  . LAPAROSCOPY     Endometriosis  . LEFT HEART CATHETERIZATION WITH CORONARY ANGIOGRAM N/A 12/05/2013   Procedure: LEFT HEART CATHETERIZATION WITH CORONARY ANGIOGRAM;  Surgeon: JJettie Booze MD;  [facility];  Service: Cardiovascular;  Laterality: N/A;  . right kidney drained    . SIMPLE MASTECTOMY WITH AXILLARY SENTINEL NODE BIOPSY Right 12/23/2019   Procedure: Right SIMPLE MASTECTOMY WITH AXILLARY SENTINEL NODE BIOPSY;  Surgeon: RRonny Bacon MD;  Location: [facility] ORS;  Service: General;  Laterality: Right;  . TEE WITHOUT CARDIOVERSION N/A 11/28/2013   Procedure: TRANSESOPHAGEAL ECHOCARDIOGRAM (TEE);  Surgeon: PThayer Headings MD;  [place]  Service: Cardiovascular;  Laterality: N/A;  . WOUND DEBRIDEMENT Right 01/29/2020   Procedure: DEBRIDEMENT WOUND, Excisional debridement skin, subcutaneous and muscle right chest wall;  Surgeon: RRonny Bacon MD;  Location: [facility] ORS;  Service: General;  Laterality: Right;    SOCIAL HISTORY: Social History   Socioeconomic History  . Marital status: Married    Spouse name: Not on file  . Number of children: 2  . Years of education: Not on file  . Highest education level: Not on file  Occupational History  . Not on file  Tobacco Use  . Smoking status: Current Some Day Smoker    Packs/day: 0.50    Years: 36.00    Pack years: 18.00    Types: Cigarettes    Start date: 07/11/1981  .  Smokeless tobacco: Never Used  . Tobacco comment: 1 ppd now  Vaping Use  . Vaping Use: Some days  Substance and Sexual Activity  . Alcohol use: No  . Drug use: No  . Sexual activity: Not Currently  Other Topics Concern  . Not on file  Social History Narrative   ** Merged History Encounter **       Lives in [place]   Married but lives with Boyfriend - not legally separated   Disabled - for BiPolar, Seizure disorder, diabetes   Formerly worked at [employer] 1 ppd; no alcohol. 2 step sons; no biologic children.       Social Determinants of Health   Financial Resource Strain:   . Difficulty of Paying Living Expenses:   Food Insecurity:   . Worried About Charity fundraiser in the Last Year:   . Arts development officer in the Last Year:   Transportation Needs:   . Film/video editor (Medical):   Marland Kitchen Lack of Transportation (Non-Medical):   Physical Activity:   . Days of Exercise per Week:   . Minutes of Exercise per Session:   Stress:   . Feeling of Stress :   Social Connections:   . Frequency of Communication with Friends and Family:   . Frequency of Social Gatherings with Friends and Family:   . Attends Religious Services:   . Active Member of Clubs or Organizations:   . Attends Archivist Meetings:   Marland Kitchen Marital Status:   Intimate Partner Violence:   . Fear of Current or Ex-Partner:   . Emotionally Abused:   Marland Kitchen Physically Abused:   . Sexually Abused:     FAMILY HISTORY: Family History  Problem Relation Age of Onset  . Venous thrombosis Brother   . Other Brother        BRAIN TUMOR  . Asthma Father   . Diabetes Father   . Coronary artery disease Mother   . Hypertension Mother   . Diabetes Mother   . Breast cancer Mother 71  . Asthma Sister   . Diabetes type II Brother     ALLERGIES:  is allergic to adhesive [tape], metoprolol, montelukast, morphine sulfate, penicillins, prednisone, diltiazem, gabapentin, and midodrine.  MEDICATIONS:  Current Outpatient Medications  Medication Sig Dispense Refill  . acetaminophen (TYLENOL) 500 MG tablet Take 1,000 mg by mouth every 6 (six) hours as needed for moderate pain.     Marland Kitchen albuterol (VENTOLIN HFA) 108 (90 Base) MCG/ACT inhaler Inhale 2 puffs into the lungs every 6 (six) hours as needed for wheezing or shortness of breath.    . ALPRAZolam (XANAX) 1 MG tablet Take 1 mg by mouth 4 (four) times daily as needed for anxiety.     Marland Kitchen aspirin EC 81 MG tablet Take 81 mg by mouth daily before breakfast.     . budesonide-formoterol (SYMBICORT) 80-4.5 MCG/ACT inhaler INHALE 2 PUFFS BY MOUTH EVERY 12 HOURS TO PREVENT COUGH OR WHEEZING *RINSE MOUTH AFTER EACH USE* (Patient taking differently: Inhale 2 puffs into the lungs in the morning  and at bedtime. ) 10.2 g 3  . busPIRone (BUSPAR) 30 MG tablet Take 30 mg by mouth 2 (two) times daily.    . citalopram (CELEXA) 20 MG tablet Take 1 tablet (20 mg total) by mouth daily. 30 tablet 2  . Dapagliflozin-metFORMIN HCl ER 5-500 MG TB24 Take 1 tablet by mouth daily with breakfast.    . famotidine (  PEPCID) 20 MG tablet TAKE 1 TABLET (20 MG TOTAL) BY MOUTH 2 (TWO) TIMES DAILY. FOR HEARTBURN. (Patient taking differently: Take 20 mg by mouth 2 (two) times daily. ) 180 tablet 1  . fluconazole (DIFLUCAN) 100 MG tablet Take 1 tablet (100 mg total) by mouth daily. 14 tablet 0  . FLUoxetine (PROZAC) 40 MG capsule Take 40 mg by mouth daily.     . Insulin Pen Needle 32G X 4 MM MISC Used to give insulin injections twice daily. 100 each 11  . insulin regular human CONCENTRATED (HUMULIN R) 500 UNIT/ML injection Inject 30 Units into the skin 3 (three) times daily with meals.     . Insulin Syringe-Needle U-100 (INSULIN SYRINGE 1CC/30GX1/2") 30G X 1/2" 1 ML MISC 1 each by Does not apply route 2 (two) times daily. E11.9 200 each 0  . ipratropium-albuterol (DUONEB) 0.5-2.5 (3) MG/3ML SOLN Take 3 mLs by nebulization every 4 (four) hours as needed (wheezing/shortness of breath). 360 mL 0  . metolazone (ZAROXOLYN) 5 MG tablet Take 5 mg by mouth daily.     . nicotine (NICODERM CQ - DOSED IN MG/24 HOURS) 21 mg/24hr patch Place 1 patch (21 mg total) onto the skin daily. 28 patch 0  . ondansetron (ZOFRAN) 4 MG tablet Take 4 mg by mouth 2 (two) times daily.     Glory Rosebush Delica Lancets 72I MISC 1 each by Does not apply route 3 (three) times daily. Use to monitor glucose levels three times daily; E11.9; E10.42 100 each 11  . ONETOUCH VERIO test strip USE AS DIRECTED TO CHECK BLOOD SUGAR 3 TIMES DAILY 100 each 0  . oxycodone (OXY-IR) 5 MG capsule Take 1 capsule (5 mg total) by mouth every 6 (six) hours as needed for pain (30 minutes prior ressing changes). 15 capsule 0  . OXYGEN Inhale 2-4 L into the lungs 2 (two)  times daily as needed (shortness of breath).     . pregabalin (LYRICA) 300 MG capsule TAKE 1 CAPSULE BY MOUTH TWICE DAILY 180 capsule 0  . rOPINIRole (REQUIP) 1 MG tablet TAKE 1 TABLET BY MOUTH EVERY NIGHT AT BEDTIME FOR RESTLESS LEGS. (Patient taking differently: Take 1 mg by mouth at bedtime. ) 90 tablet 1  . rosuvastatin (CRESTOR) 10 MG tablet Take 1 tablet (10 mg total) by mouth daily. (Patient taking differently: Take 10 mg by mouth at bedtime. ) 90 tablet 3  . spironolactone (ALDACTONE) 50 MG tablet TAKE 1 TABLET BY MOUTH ONCE A DAY (Patient taking differently: Take 50 mg by mouth daily. ) 90 tablet 1  . tamoxifen (NOLVADEX) 20 MG tablet Take 1 tablet (20 mg total) by mouth daily. 30 tablet 1  . torsemide (DEMADEX) 20 MG tablet TAKE 1 TABLET BY MOUTH 4 TIMES DAILY (Patient taking differently: Take 20 mg by mouth 2 (two) times daily. ) 360 tablet 3  . traZODone (DESYREL) 100 MG tablet Take 200 mg by mouth at bedtime.     Marland Kitchen ULTICARE MINI PEN NEEDLES 31G X 6 MM MISC USE AS DIRECTED THREE TIMES DAILY 100 each 0   No current facility-administered medications for this visit.      Marland Kitchen  PHYSICAL EXAMINATION: ECOG PERFORMANCE STATUS: 0 - Asymptomatic  There were no vitals filed for this visit. There were no vitals filed for this visit.  Physical Exam Constitutional:      Comments: Patient is obese.  She is in a wheelchair.  Accompanied by husband.  She smells nicotine.  HENT:  Head: Normocephalic and atraumatic.     Mouth/Throat:     Pharynx: No oropharyngeal exudate.  Eyes:     Pupils: Pupils are equal, round, and reactive to light.  Cardiovascular:     Rate and Rhythm: Normal rate and regular rhythm.  Pulmonary:     Effort: No respiratory distress.     Breath sounds: No wheezing.  Abdominal:     General: Bowel sounds are normal. There is no distension.     Palpations: Abdomen is soft. There is no mass.     Tenderness: There is no abdominal tenderness. There is no guarding or  rebound.  Musculoskeletal:        General: No tenderness. Normal range of motion.     Cervical back: Normal range of motion and neck supple.  Skin:    General: Skin is warm.  Neurological:     Mental Status: She is alert and oriented to person, place, and time.  Psychiatric:        Mood and Affect: Affect normal.     Comments: Anxious.      LABORATORY DATA:  I have reviewed the data as listed Lab Results  Component Value Date   WBC 11.3 (H) 01/28/2020   HGB 11.6 (L) 01/28/2020   HCT 37.7 01/28/2020   MCV 82.9 01/28/2020   PLT 222 01/28/2020   Recent Labs    09/23/19 1732 10/23/19 1619 11/27/19 2214 11/27/19 2214 12/16/19 1035 12/16/19 1035 12/24/19 0531 12/25/19 0433 01/28/20 0911  NA  --    < > 133*   < > 134*   < > 130* 135 132*  K  --    < > 4.4   < > 3.8   < > 4.3 4.5 3.8  CL  --    < > 95*   < > 89*   < > 88* 95* 90*  CO2  --    < > 26   < > 31   < > _0 GLUCOSE  --    < > 407*   < > 183*   < > 494* 388* 395*  BUN  --    < > 57*   < > 60*   < > 70* 62* 35*  CREATININE  --    < > 1.79*   < > 1.93*   < > 2.21* 1.60* 1.42*  CALCIUM  --    < > 8.8*   < > 9.2   < > 8.7* 8.8* 8.6*  GFRNONAA  --    < > 32*   < > 30*   < > 25* 37* 43*  GFRAA  --    < > 38*   < > 34*   < > 29* 43* 49*  PROT 8.8*   < > 8.8*  --  8.8*  --   --   --  8.6*  ALBUMIN 4.0   < > 3.7  --  3.6  --   --   --  3.5  AST 40   < > 34  --  27  --   --   --  16  ALT 44   < > 35  --  26  --   --   --  19  ALKPHOS 125   < > 116  --  114  --   --   --  124  BILITOT 1.1   < > 1.0  --  1.2  --   --   --  1.3*  BILIDIR 0.2  --   --   --   --   --   --   --   --   IBILI 0.9  --   --   --   --   --   --   --   --    < > = values in this interval not displayed.    RADIOGRAPHIC STUDIES: I have personally reviewed the radiological images as listed and agreed with the findings in the report. No results found.  ASSESSMENT & PLAN:   No problem-specific Assessment & Plan notes found for this  encounter.  # 40 minutes face-to-face with the patient discussing the above plan of care; more than 50% of time spent on prognosis/ natural history; counseling and coordination.   All questions were answered. The patient/family knows to call the clinic with any problems, questions or concerns.    Cammie Sickle, MD 02/24/2020 8:30 AM

## 2020-02-24 NOTE — Telephone Encounter (Signed)
Patient states that the skin just above her wound is bright red and warm and irritated. She states that her wound bed looks good. She still has two more doses of antifungal prescription left. She would like to see if she can come in tomorrow to be seen instead of Wednesday.  I have sent a message to Edison Simon PA-C about this.

## 2020-02-24 NOTE — Progress Notes (Addendum)
Received message from [facility], Dr. Sharmaine Base nurse that patient was calling to cancel her appointment for today.  Called and left message for patient to return my call.  I did ask were there any barriers getting to her appointments.  Patient returned my call.  States she is going every week to see Dr. Loletha Grayer assistant for dressing changes for her surgical wound.  States the wound is being "packed" and her husband is assisting with daily dressing changes.  States she is having so "much stinging and burning pain" at the surgical site, that it takes everything to get to the surgical appointment, and she just can't do anymore.  Reviewed any other barriers to getting to her appointments and she states no.  Offered to coordinate her oncology appointment with her surgical appointment if possible if it would be easier to get here.  She is agreeable to that plan.  Will see if Dr. [name] wants to see her now or closer to wound healing and assist with coordinating her appointment.

## 2020-02-24 NOTE — Telephone Encounter (Signed)
Patient is calling and just has some concerns and is asking for one of the nurses to give her a call. Please call patient and advise

## 2020-02-24 NOTE — Assessment & Plan Note (Deleted)
#  Early stage breast cancer clinical stage I-II [T2 N0] ER/PR positive HER-2 negative; surgery curently on hold sec to medical co-morbidties/ see below. Ok to start Tamoxifen "neo-adjuvant"-discussed the rationale of starting tamoxifen.  Discussed the mechanism of action potential side effects include but not limited to hot flashes joint pains etc.  # COPD exacerbation/symptomatic hold off steroids.  No signs of active infection.- 88 on RA; improved on 93 on 3 lits. Recommend nebs q 4 hours; follow up PCP/pulmonary.  Continue Advair albuterol.  Chest x-ray-December 16, 2019 reviewed; no acute process noted.  No concern for PE.  #Poorly controlled diabetes-on insulin; followed endocrinology.  #Peripheral neuropathy grade 2-3; patient goes on with a walker/cane at baseline.  Continue Lyrica.  Stable  #Stage III kidney disease-second diabetes-overall stable.  DISPOSITION: # follow up in 3 weeks; MDL labs- cbc/[id number];cea; ca-15-3- Dr.B  

## 2020-02-24 NOTE — Telephone Encounter (Signed)
She will be here tomorrow to be seen by Alroy Dust at 11 am.

## 2020-02-25 ENCOUNTER — Other Ambulatory Visit: Payer: Self-pay

## 2020-02-25 ENCOUNTER — Telehealth: Payer: Self-pay | Admitting: Emergency Medicine

## 2020-02-25 ENCOUNTER — Emergency Department
Admission: EM | Admit: 2020-02-25 | Discharge: 2020-02-25 | Disposition: A | Discharge status: Home / Self Care | Payer: BC Managed Care – PPO | Attending: Emergency Medicine | Admitting: Emergency Medicine

## 2020-02-25 ENCOUNTER — Inpatient Hospital Stay: Payer: BC Managed Care – PPO | Attending: Internal Medicine | Admitting: Internal Medicine

## 2020-02-25 ENCOUNTER — Encounter: Payer: Self-pay | Admitting: Physician Assistant

## 2020-02-25 ENCOUNTER — Ambulatory Visit (INDEPENDENT_AMBULATORY_CARE_PROVIDER_SITE_OTHER): Payer: Medicare HMO | Admitting: Physician Assistant

## 2020-02-25 ENCOUNTER — Other Ambulatory Visit
Admission: RE | Admit: 2020-02-25 | Discharge: 2020-02-25 | Disposition: A | Discharge status: Home / Self Care | Payer: BC Managed Care – PPO | Source: Home / Self Care | Attending: Physician Assistant | Admitting: Physician Assistant

## 2020-02-25 VITALS — BP 106/71 | HR 77 | Temp 98.3°F | Resp 14

## 2020-02-25 DIAGNOSIS — L089 Local infection of the skin and subcutaneous tissue, unspecified: Secondary | ICD-10-CM | POA: Diagnosis not present

## 2020-02-25 DIAGNOSIS — Z9011 Acquired absence of right breast and nipple: Secondary | ICD-10-CM | POA: Insufficient documentation

## 2020-02-25 DIAGNOSIS — Z09 Encounter for follow-up examination after completed treatment for conditions other than malignant neoplasm: Secondary | ICD-10-CM

## 2020-02-25 DIAGNOSIS — Z5321 Procedure and treatment not carried out due to patient leaving prior to being seen by health care provider: Secondary | ICD-10-CM | POA: Diagnosis not present

## 2020-02-25 DIAGNOSIS — R030 Elevated blood-pressure reading, without diagnosis of hypertension: Secondary | ICD-10-CM | POA: Insufficient documentation

## 2020-02-25 LAB — CBC
HCT: 35.8 % — ABNORMAL LOW (ref 36.0–46.0)
Hemoglobin: 11.1 g/dL — ABNORMAL LOW (ref 12.0–15.0)
MCH: 24.2 pg — ABNORMAL LOW (ref 26.0–34.0)
MCHC: 31 g/dL (ref 30.0–36.0)
MCV: 78.2 fL — ABNORMAL LOW (ref 80.0–100.0)
Platelets: 216 10*3/uL (ref 150–400)
RBC: 4.58 MIL/uL (ref 3.87–5.11)
RDW: 18.1 % — ABNORMAL HIGH (ref 11.5–15.5)
WBC: 8.2 10*3/uL (ref 4.0–10.5)
nRBC: 0 % (ref 0.0–0.2)

## 2020-02-25 LAB — COMPREHENSIVE METABOLIC PANEL
ALT: 20 U/L (ref 0–44)
AST: 20 U/L (ref 15–41)
Albumin: 3.5 g/dL (ref 3.5–5.0)
Alkaline Phosphatase: 186 U/L — ABNORMAL HIGH (ref 38–126)
Anion gap: 13 (ref 5–15)
BUN: 40 mg/dL — ABNORMAL HIGH (ref 6–20)
CO2: 28 mmol/L (ref 22–32)
Calcium: 8.9 mg/dL (ref 8.9–10.3)
Chloride: 88 mmol/L — ABNORMAL LOW (ref 98–111)
Creatinine, Ser: 1.39 mg/dL — ABNORMAL HIGH (ref 0.44–1.00)
GFR calc Af Amer: 51 mL/min — ABNORMAL LOW (ref >60)
GFR calc non Af Amer: 44 mL/min — ABNORMAL LOW (ref >60)
Glucose, Bld: 617 mg/dL (ref 70–99)
Potassium: 4.2 mmol/L (ref 3.5–5.1)
Sodium: 129 mmol/L — ABNORMAL LOW (ref 135–145)
Total Bilirubin: 1.2 mg/dL (ref 0.3–1.2)
Total Protein: 9.1 g/dL — ABNORMAL HIGH (ref 6.5–8.1)

## 2020-02-25 LAB — CBC WITH DIFFERENTIAL/PLATELET
Abs Immature Granulocytes: 0.15 10*3/uL — ABNORMAL HIGH (ref 0.00–0.07)
Basophils Absolute: 0.1 10*3/uL (ref 0.0–0.1)
Basophils Relative: 1 %
Eosinophils Absolute: 0.2 10*3/uL (ref 0.0–0.5)
Eosinophils Relative: 3 %
HCT: 36.9 % (ref 36.0–46.0)
Hemoglobin: 11.3 g/dL — ABNORMAL LOW (ref 12.0–15.0)
Immature Granulocytes: 2 %
Lymphocytes Relative: 10 %
Lymphs Abs: 0.8 10*3/uL (ref 0.7–4.0)
MCH: 24.1 pg — ABNORMAL LOW (ref 26.0–34.0)
MCHC: 30.6 g/dL (ref 30.0–36.0)
MCV: 78.8 fL — ABNORMAL LOW (ref 80.0–100.0)
Monocytes Absolute: 0.6 10*3/uL (ref 0.1–1.0)
Monocytes Relative: 7 %
Neutro Abs: 6.4 10*3/uL (ref 1.7–7.7)
Neutrophils Relative %: 77 %
Platelets: 227 10*3/uL (ref 150–400)
RBC: 4.68 MIL/uL (ref 3.87–5.11)
RDW: 17.9 % — ABNORMAL HIGH (ref 11.5–15.5)
WBC: 8.3 10*3/uL (ref 4.0–10.5)
nRBC: 0 % (ref 0.0–0.2)

## 2020-02-25 LAB — GLUCOSE, CAPILLARY: Glucose-Capillary: 589 mg/dL (ref 70–99)

## 2020-02-25 LAB — BASIC METABOLIC PANEL
Anion gap: 13 (ref 5–15)
BUN: 43 mg/dL — ABNORMAL HIGH (ref 6–20)
CO2: 26 mmol/L (ref 22–32)
Calcium: 8.8 mg/dL — ABNORMAL LOW (ref 8.9–10.3)
Chloride: 89 mmol/L — ABNORMAL LOW (ref 98–111)
Creatinine, Ser: 1.29 mg/dL — ABNORMAL HIGH (ref 0.44–1.00)
GFR calc Af Amer: 56 mL/min — ABNORMAL LOW (ref >60)
GFR calc non Af Amer: 48 mL/min — ABNORMAL LOW (ref >60)
Glucose, Bld: 623 mg/dL (ref 70–99)
Potassium: 4.3 mmol/L (ref 3.5–5.1)
Sodium: 128 mmol/L — ABNORMAL LOW (ref 135–145)

## 2020-02-25 LAB — LACTIC ACID, PLASMA: Lactic Acid, Venous: 1.4 mmol/L (ref 0.5–1.9)

## 2020-02-25 NOTE — Progress Notes (Signed)
[facility] POST-OP OFFICE VISIT  02/25/2020  HPI: [name] is a 49 y.o. female 27 days s/p excisional debridement of right chest pall and application of wound vac secondary to right mastectomy wound dehiscence with Dr Christian Mate. She was switched to wet-to-dry dressing changes due to concern for yeast infection with wound vac.   She was doing well at home however in the last 24-48 hours she reports feeling a new "knot" in her right chest wall just superior to the medial portion of the wound. This has been tender to the touch and warm. Additionally, she reports some new foul smelling drainage from the wound over the same time frame. No fever or chills. She continues with the wet-to-dry dressing changes at home. Still endorses pain.   Vital signs: BP 106/71   Pulse 77   Temp 98.3 F (36.8 C)   Resp 14   SpO2 97%    Physical Exam: Constitutional: Somewhat disheveled appearance, NAD Skin: Large right mastectomy wound has decreased in size since last examination, wound bed is almost 100% granulation tissue however she does have an area of swelling, warmth, and tenderness just superior to the medial portion of the wound. There is blanching erythema. There does appear to be purulent drainage in the wound and I am am to express this. I attempted to probe the wound with q-tip but I could not easily identify a drainage track. There surrounding skin still appears quite macerated.   Assessment/Plan: This is a 49 y.o. female 27 days s/p excisional debridement of right chest pall and application of wound vac secondary to right mastectomy wound dehiscence    - I am concerned that she may have an undrained abscess and concomitant wound infection. I was unable to identify a drainage tract or abscess readily on examination given patient tolerance. I am a little concerned for lack of proper hygiene at home and this wound may have close prematurely. As such, I obtained a culture of the  drainage from the wound. I will send her to get STAT CBC to look for leukocytosis and BMP. Additionally, I will obtain CT Chest to look for undrained fluid collections or underlying infection. Pending these results I will either direct admit her for IV ABx and wound care or send her PO Rx for antibiotic coverage and close office follow up. Patient and her husband are understanding of my thought process and agreeable with POC outlined above. They understand that I will call them when results become available.   -- Edison Simon, PA-C [facility] 02/25/2020, 11:34 AM [phone] M-F: 7am - 4pm

## 2020-02-25 NOTE — Assessment & Plan Note (Deleted)
#  Early stage breast cancer clinical stage I-II [T2 N0] ER/PR positive HER-2 negative; surgery curently on hold sec to medical co-morbidties/ see below. Ok to start Tamoxifen "neo-adjuvant"-discussed the rationale of starting tamoxifen.  Discussed the mechanism of action potential side effects include but not limited to hot flashes joint pains etc.  # COPD exacerbation/symptomatic hold off steroids.  No signs of active infection.- 88 on RA; improved on 93 on 3 lits. Recommend nebs q 4 hours; follow up PCP/pulmonary.  Continue Advair albuterol.  Chest x-ray-December 16, 2019 reviewed; no acute process noted.  No concern for PE.  #Poorly controlled diabetes-on insulin; followed endocrinology.  #Peripheral neuropathy grade 2-3; patient goes on with a walker/cane at baseline.  Continue Lyrica.  Stable  #Stage III kidney disease-second diabetes-overall stable.  DISPOSITION: # follow up in 3 weeks; MDL labs- cbc/cmp.ca27-29;cea; ca-15-3- Dr.B

## 2020-02-25 NOTE — Patient Instructions (Addendum)
We will schedule you for a CT scan. You will also need to get lab work done.  Thedore Mins will give you a call when he has results and will give you further information.

## 2020-02-25 NOTE — ED Notes (Signed)
Pt does not want to wait for room any longer. Aware of elevated blood sugar but still wants to leave.

## 2020-02-25 NOTE — ED Triage Notes (Signed)
Pt arrives to ER from MD with infection to port site. Had port removed and reports has become infected the last 3 days. Denies fever. Hx DM 2, states CBG was 620 at doctors office. A&O, in wheelchair.

## 2020-02-25 NOTE — Progress Notes (Deleted)
[facility] [place] NOTE  Patient Care Team: Pleas Koch, NP as PCP - General (Internal Medicine) Rosamaria Lints, MD (Inactive) as Referring Physician (Neurology) Alvester Chou, NP (Nurse Practitioner) Renato Shin, MD as Consulting Physician (Endocrinology) Theodore Demark, RN as Oncology Nurse Navigator Ronny Bacon, MD as Consulting Physician (General Surgery)  CHIEF COMPLAINTS/PURPOSE OF CONSULTATION: Breast cancer  #  Oncology History Overview Note  #April 2021-2.5 cm right breast mass [incidental-CT scan chest]  # April 2021- RIGHT BREAST [facility]; s/p ER- 51-90%; PR- 51-90%; Her- 2-NEG. G-2. [Dr.Rodenberg]  # DM- poorly controlled; Seizure disorder/Bipolar/TIA ; Diabetes PN- G-3 [cane/walker; falls]; COPD; OSA active smoker  # SURVIVORSHIP:   # GENETICS:   DIAGNOSIS:   STAGE:         ;  GOALS:  CURRENT/MOST RECENT THERAPY :      Carcinoma of lower-outer quadrant of right breast in female, estrogen receptor positive ([place])  11/12/2019 Initial Diagnosis   Carcinoma of lower-outer quadrant of right breast in female, estrogen receptor positive ([place])      HISTORY OF PRESENTING ILLNESS:  [name] 49 y.o.  female  with multiple medical problems include history of TIA, bipolar disorder morbid obesity poorly controlled diabetes with neuropathy-recently diagnosed breast cancer stage II ER/PR positive HER-2 negative.  Patient was evaluated by surgery; however surgery had to be on hold because of patient's comorbidities-poorly controlled diabetes; COPD etc.  Patient was evaluated in the emergency room yesterday for shortness of breath fatigue/confusion.-Patient had appropriate work-up without any causes.  Patient continues to have worsening fatigue.  Worsening cough.  Worsening shortness of breath.  She is currently on oxygen only at nighttime.  At 2 L.  Review of Systems  Constitutional: Negative for chills, diaphoresis, fever, malaise/fatigue and  weight loss.  HENT: Negative for nosebleeds and sore throat.   Eyes: Negative for double vision.  Respiratory: Positive for cough and shortness of breath. Negative for hemoptysis, sputum production and wheezing.   Cardiovascular: Negative for chest pain, palpitations, orthopnea and leg swelling.  Gastrointestinal: Negative for abdominal pain, blood in stool, constipation, diarrhea, heartburn, melena, nausea and vomiting.  Genitourinary: Negative for dysuria, frequency and urgency.  Musculoskeletal: Positive for back pain and joint pain.  Skin: Negative.  Negative for itching and rash.  Neurological: Positive for tingling. Negative for dizziness, focal weakness, weakness and headaches.  Endo/Heme/Allergies: Does not bruise/bleed easily.  Psychiatric/Behavioral: Negative for depression. The patient is nervous/anxious and has insomnia.      MEDICAL HISTORY:  Past Medical History:  Diagnosis Date  . Anxiety    takes Prozac daily  . Anxiety   . Aortic valve calcification   . Asthma    Advair and Spirva daily  . Asthma   . Bipolar disorder ([place])   . CAD (coronary artery disease)    a. LHC 11/2013 done for CP/fluid retention: mild disease in prox LAD, mild-mod disease in mRCA, EF 60% with normal LVEDP. b. Normal nuc 03/2016.  Marland Kitchen CHF (congestive heart failure) ([place])   . Chronic diastolic CHF (congestive heart failure) ([place])   . Chronic heart failure with preserved ejection fraction ([place]) 11/16/2018  . CKD (chronic kidney disease), stage II   . COPD (chronic obstructive pulmonary disease) (H[facility])    a. nocturnal O2.  Marland Kitchen COPD (chronic obstructive pulmonary disease) ([place])   . Coronary artery disease   . Decreased urine stream   . Diabetes mellitus   . Diabetes mellitus without complication ([place])   . Dyspnea   .  Family history of adverse reaction to anesthesia    mom gets nauseated  . GERD (gastroesophageal reflux disease)    takes Pepcid daily  . History of blood clots    left leg 3-23yr ago   . Hyperlipidemia   . Hypertension   . Hypertriglyceridemia   . Inguinal hernia, left 01/2015  . Muscle spasm   . Open wound of genital labia   . Peripheral neuropathy   . RBBB   . Seizures ([place]   . Sepsis ([place] 01/19/2018  . Sinus tachycardia    a. persistent since 2009.  .Marland KitchenSmokers' cough ([place]   . Stroke ([facility] 1989   left sided weakness  . TIA (transient ischemic attack)   . Tobacco abuse   . Vulvar abscess 01/23/2018    SURGICAL HISTORY: Past Surgical History:  Procedure Laterality Date  . APPLICATION OF WOUND VAC Right 01/29/2020   Procedure: APPLICATION OF WOUND VAC;  Surgeon: RRonny Bacon MD;  Location: [facility] ORS;  Service: General;  Laterality: Right;  . BREAST BIOPSY Right 11/06/2019   u[place]oreacore path pending venus clip  . HERNIA REPAIR Left   . INCISION AND DRAINAGE ABSCESS N/A 01/26/2018   Procedure: INCISION AND DEBRIDEMENT OF VULVAR NECROTIZING SOFT TISSUE INFECTION;  Surgeon: WGreer Pickerel MD;  [place] [facility] [place]  Service: General;  Laterality: N/A;  . INCISION AND DRAINAGE PERIRECTAL ABSCESS N/A 01/22/2018   Procedure: IRRIGATION AND DEBRIDEMENT LABIAL/VULVAR AREA;  Surgeon: BCoralie Keens MD;  [place]  Service: General;  Laterality: N/A;  . INCISION AND DRAINAGE PERIRECTAL ABSCESS N/A 01/29/2018   Procedure: IRRIGATION AND DEBRIDEMENT VULVA;  Surgeon: HExcell Seltzer MD;  [place]: [facility]  Service: General;  Laterality: N/A;  . INGUINAL HERNIA REPAIR Left 04/08/2015   Procedure: OPEN LEFT INGUINAL HERNIA REPAIR WITH MESH;  Surgeon: ARalene Ok MD;  [place] [facility]  Service: General;  Laterality: Left;  . INSERTION OF MESH Left 04/08/2015   Procedure: INSERTION OF MESH;  Surgeon: ARalene Ok MD;  Location: [facility]  Service: General;  Laterality: Left;  . LAPAROSCOPY     Endometriosis  . LEFT HEART CATHETERIZATION WITH CORONARY ANGIOGRAM N/A 12/05/2013   Procedure: LEFT HEART CATHETERIZATION WITH CORONARY ANGIOGRAM;  Surgeon: JJettie Booze MD;  [facility];  Service: Cardiovascular;  Laterality: N/A;  . right kidney drained    . SIMPLE MASTECTOMY WITH AXILLARY SENTINEL NODE BIOPSY Right 12/23/2019   Procedure: Right SIMPLE MASTECTOMY WITH AXILLARY SENTINEL NODE BIOPSY;  Surgeon: RRonny Bacon MD;  Location: [facility] ORS;  Service: General;  Laterality: Right;  . TEE WITHOUT CARDIOVERSION N/A 11/28/2013   Procedure: TRANSESOPHAGEAL ECHOCARDIOGRAM (TEE);  Surgeon: PThayer Headings MD;  [place]  Service: Cardiovascular;  Laterality: N/A;  . WOUND DEBRIDEMENT Right 01/29/2020   Procedure: DEBRIDEMENT WOUND, Excisional debridement skin, subcutaneous and muscle right chest wall;  Surgeon: RRonny Bacon MD;  Location: [facility] ORS;  Service: General;  Laterality: Right;    SOCIAL HISTORY: Social History   Socioeconomic History  . Marital status: Married    Spouse name: Not on file  . Number of children: 2  . Years of education: Not on file  . Highest education level: Not on file  Occupational History  . Not on file  Tobacco Use  . Smoking status: Current Some Day Smoker    Packs/day: 0.50    Years: 36.00    Pack years: 18.00    Types: Cigarettes    Start date: 07/11/1981  .  Smokeless tobacco: Never Used  . Tobacco comment: 1 ppd now  Vaping Use  . Vaping Use: Some days  Substance and Sexual Activity  . Alcohol use: No  . Drug use: No  . Sexual activity: Not Currently  Other Topics Concern  . Not on file  Social History Narrative   ** Merged History Encounter **       Lives in [place]   Married but lives with Boyfriend - not legally separated   Disabled - for BiPolar, Seizure disorder, diabetes   Formerly worked at [employer] 1 ppd; no alcohol. 2 step sons; no biologic children.       Social Determinants of Health   Financial Resource Strain:   . Difficulty of Paying Living Expenses:   Food Insecurity:   . Worried About Charity fundraiser in the Last Year:   . Arts development officer in the Last Year:   Transportation Needs:   . Film/video editor (Medical):   Marland Kitchen Lack of Transportation (Non-Medical):   Physical Activity:   . Days of Exercise per Week:   . Minutes of Exercise per Session:   Stress:   . Feeling of Stress :   Social Connections:   . Frequency of Communication with Friends and Family:   . Frequency of Social Gatherings with Friends and Family:   . Attends Religious Services:   . Active Member of Clubs or Organizations:   . Attends Archivist Meetings:   Marland Kitchen Marital Status:   Intimate Partner Violence:   . Fear of Current or Ex-Partner:   . Emotionally Abused:   Marland Kitchen Physically Abused:   . Sexually Abused:     FAMILY HISTORY: Family History  Problem Relation Age of Onset  . Venous thrombosis Brother   . Other Brother        BRAIN TUMOR  . Asthma Father   . Diabetes Father   . Coronary artery disease Mother   . Hypertension Mother   . Diabetes Mother   . Breast cancer Mother 71  . Asthma Sister   . Diabetes type II Brother     ALLERGIES:  is allergic to adhesive [tape], metoprolol, montelukast, morphine sulfate, penicillins, prednisone, diltiazem, gabapentin, and midodrine.  MEDICATIONS:  Current Outpatient Medications  Medication Sig Dispense Refill  . acetaminophen (TYLENOL) 500 MG tablet Take 1,000 mg by mouth every 6 (six) hours as needed for moderate pain.     Marland Kitchen albuterol (VENTOLIN HFA) 108 (90 Base) MCG/ACT inhaler Inhale 2 puffs into the lungs every 6 (six) hours as needed for wheezing or shortness of breath.    . ALPRAZolam (XANAX) 1 MG tablet Take 1 mg by mouth 4 (four) times daily as needed for anxiety.     Marland Kitchen aspirin EC 81 MG tablet Take 81 mg by mouth daily before breakfast.     . budesonide-formoterol (SYMBICORT) 80-4.5 MCG/ACT inhaler INHALE 2 PUFFS BY MOUTH EVERY 12 HOURS TO PREVENT COUGH OR WHEEZING *RINSE MOUTH AFTER EACH USE* (Patient taking differently: Inhale 2 puffs into the lungs in the morning  and at bedtime. ) 10.2 g 3  . busPIRone (BUSPAR) 30 MG tablet Take 30 mg by mouth 2 (two) times daily.    . citalopram (CELEXA) 20 MG tablet Take 1 tablet (20 mg total) by mouth daily. 30 tablet 2  . Dapagliflozin-metFORMIN HCl ER 5-500 MG TB24 Take 1 tablet by mouth daily with breakfast.    . famotidine (  PEPCID) 20 MG tablet TAKE 1 TABLET (20 MG TOTAL) BY MOUTH 2 (TWO) TIMES DAILY. FOR HEARTBURN. (Patient taking differently: Take 20 mg by mouth 2 (two) times daily. ) 180 tablet 1  . fluconazole (DIFLUCAN) 100 MG tablet Take 1 tablet (100 mg total) by mouth daily. 14 tablet 0  . FLUoxetine (PROZAC) 40 MG capsule Take 40 mg by mouth daily.     . Insulin Pen Needle 32G X 4 MM MISC Used to give insulin injections twice daily. 100 each 11  . insulin regular human CONCENTRATED (HUMULIN R) 500 UNIT/ML injection Inject 30 Units into the skin 3 (three) times daily with meals.     . Insulin Syringe-Needle U-100 (INSULIN SYRINGE 1CC/30GX1/2") 30G X 1/2" 1 ML MISC 1 each by Does not apply route 2 (two) times daily. E11.9 200 each 0  . ipratropium-albuterol (DUONEB) 0.5-2.5 (3) MG/3ML SOLN Take 3 mLs by nebulization every 4 (four) hours as needed (wheezing/shortness of breath). 360 mL 0  . metolazone (ZAROXOLYN) 5 MG tablet Take 5 mg by mouth daily.     . nicotine (NICODERM CQ - DOSED IN MG/24 HOURS) 21 mg/24hr patch Place 1 patch (21 mg total) onto the skin daily. 28 patch 0  . ondansetron (ZOFRAN) 4 MG tablet Take 4 mg by mouth 2 (two) times daily.     Glory Rosebush Delica Lancets 72I MISC 1 each by Does not apply route 3 (three) times daily. Use to monitor glucose levels three times daily; E11.9; E10.42 100 each 11  . ONETOUCH VERIO test strip USE AS DIRECTED TO CHECK BLOOD SUGAR 3 TIMES DAILY 100 each 0  . oxycodone (OXY-IR) 5 MG capsule Take 1 capsule (5 mg total) by mouth every 6 (six) hours as needed for pain (30 minutes prior ressing changes). 15 capsule 0  . OXYGEN Inhale 2-4 L into the lungs 2 (two)  times daily as needed (shortness of breath).     . pregabalin (LYRICA) 300 MG capsule TAKE 1 CAPSULE BY MOUTH TWICE DAILY 180 capsule 0  . rOPINIRole (REQUIP) 1 MG tablet TAKE 1 TABLET BY MOUTH EVERY NIGHT AT BEDTIME FOR RESTLESS LEGS. (Patient taking differently: Take 1 mg by mouth at bedtime. ) 90 tablet 1  . rosuvastatin (CRESTOR) 10 MG tablet Take 1 tablet (10 mg total) by mouth daily. (Patient taking differently: Take 10 mg by mouth at bedtime. ) 90 tablet 3  . spironolactone (ALDACTONE) 50 MG tablet TAKE 1 TABLET BY MOUTH ONCE A DAY (Patient taking differently: Take 50 mg by mouth daily. ) 90 tablet 1  . tamoxifen (NOLVADEX) 20 MG tablet Take 1 tablet (20 mg total) by mouth daily. 30 tablet 1  . torsemide (DEMADEX) 20 MG tablet TAKE 1 TABLET BY MOUTH 4 TIMES DAILY (Patient taking differently: Take 20 mg by mouth 2 (two) times daily. ) 360 tablet 3  . traZODone (DESYREL) 100 MG tablet Take 200 mg by mouth at bedtime.     Marland Kitchen ULTICARE MINI PEN NEEDLES 31G X 6 MM MISC USE AS DIRECTED THREE TIMES DAILY 100 each 0   No current facility-administered medications for this visit.      Marland Kitchen  PHYSICAL EXAMINATION: ECOG PERFORMANCE STATUS: 0 - Asymptomatic  There were no vitals filed for this visit. There were no vitals filed for this visit.  Physical Exam Constitutional:      Comments: Patient is obese.  She is in a wheelchair.  Accompanied by husband.  She smells nicotine.  HENT:  Head: Normocephalic and atraumatic.     Mouth/Throat:     Pharynx: No oropharyngeal exudate.  Eyes:     Pupils: Pupils are equal, round, and reactive to light.  Cardiovascular:     Rate and Rhythm: Normal rate and regular rhythm.  Pulmonary:     Effort: No respiratory distress.     Breath sounds: No wheezing.  Abdominal:     General: Bowel sounds are normal. There is no distension.     Palpations: Abdomen is soft. There is no mass.     Tenderness: There is no abdominal tenderness. There is no guarding or  rebound.  Musculoskeletal:        General: No tenderness. Normal range of motion.     Cervical back: Normal range of motion and neck supple.  Skin:    General: Skin is warm.  Neurological:     Mental Status: She is alert and oriented to person, place, and time.  Psychiatric:        Mood and Affect: Affect normal.     Comments: Anxious.      LABORATORY DATA:  I have reviewed the data as listed Lab Results  Component Value Date   WBC 11.3 (H) 01/28/2020   HGB 11.6 (L) 01/28/2020   HCT 37.7 01/28/2020   MCV 82.9 01/28/2020   PLT 222 01/28/2020   Recent Labs    09/23/19 1732 10/23/19 1619 11/27/19 2214 11/27/19 2214 12/16/19 1035 12/16/19 1035 12/24/19 0531 12/25/19 0433 01/28/20 0911  NA  --    < > 133*   < > 134*   < > 130* 135 132*  K  --    < > 4.4   < > 3.8   < > 4.3 4.5 3.8  CL  --    < > 95*   < > 89*   < > 88* 95* 90*  CO2  --    < > 26   < > 31   < > _0 GLUCOSE  --    < > 407*   < > 183*   < > 494* 388* 395*  BUN  --    < > 57*   < > 60*   < > 70* 62* 35*  CREATININE  --    < > 1.79*   < > 1.93*   < > 2.21* 1.60* 1.42*  CALCIUM  --    < > 8.8*   < > 9.2   < > 8.7* 8.8* 8.6*  GFRNONAA  --    < > 32*   < > 30*   < > 25* 37* 43*  GFRAA  --    < > 38*   < > 34*   < > 29* 43* 49*  PROT 8.8*   < > 8.8*  --  8.8*  --   --   --  8.6*  ALBUMIN 4.0   < > 3.7  --  3.6  --   --   --  3.5  AST 40   < > 34  --  27  --   --   --  16  ALT 44   < > 35  --  26  --   --   --  19  ALKPHOS 125   < > 116  --  114  --   --   --  124  BILITOT 1.1   < > 1.0  --  1.2  --   --   --  1.3*  BILIDIR 0.2  --   --   --   --   --   --   --   --   IBILI 0.9  --   --   --   --   --   --   --   --    < > = values in this interval not displayed.    RADIOGRAPHIC STUDIES: I have personally reviewed the radiological images as listed and agreed with the findings in the report. No results found.  ASSESSMENT & PLAN:   No problem-specific Assessment & Plan notes found for this  encounter.  # 40 minutes face-to-face with the patient discussing the above plan of care; more than 50% of time spent on prognosis/ natural history; counseling and coordination.   All questions were answered. The patient/family knows to call the clinic with any problems, questions or concerns.    Cammie Sickle, MD 02/25/2020 8:16 AM

## 2020-02-25 NOTE — ED Triage Notes (Signed)
First nurse note- here for CBG 625 and infection. Pt alert and oriented, NAD at this time.

## 2020-02-25 NOTE — Telephone Encounter (Signed)
[name] from [facility] lab called with critical value of patients glucose being 623.  [name], [place] to make him aware. Per [name] patient is to go to the ER at this time for Hyperglycemia.   Called patient to make her aware of [name]'s instructions. Patient verbalized understanding and is heading to the ER as we speak.

## 2020-02-26 ENCOUNTER — Ambulatory Visit
Admission: RE | Admit: 2020-02-26 | Discharge: 2020-02-26 | Disposition: A | Discharge status: Home / Self Care | Payer: BC Managed Care – PPO | Source: Ambulatory Visit | Attending: Physician Assistant | Admitting: Physician Assistant

## 2020-02-26 ENCOUNTER — Ambulatory Visit: Payer: BC Managed Care – PPO

## 2020-02-26 ENCOUNTER — Ambulatory Visit: Payer: BC Managed Care – PPO | Admitting: Internal Medicine

## 2020-02-26 ENCOUNTER — Encounter: Payer: BC Managed Care – PPO | Admitting: Physician Assistant

## 2020-02-26 ENCOUNTER — Telehealth: Payer: Self-pay | Admitting: *Deleted

## 2020-02-26 ENCOUNTER — Other Ambulatory Visit: Payer: Self-pay | Admitting: Physician Assistant

## 2020-02-26 DIAGNOSIS — Z9011 Acquired absence of right breast and nipple: Secondary | ICD-10-CM | POA: Diagnosis not present

## 2020-02-26 DIAGNOSIS — I270 Primary pulmonary hypertension: Secondary | ICD-10-CM | POA: Diagnosis not present

## 2020-02-26 DIAGNOSIS — J811 Chronic pulmonary edema: Secondary | ICD-10-CM | POA: Diagnosis not present

## 2020-02-26 DIAGNOSIS — I700 Atherosclerosis of aorta: Secondary | ICD-10-CM | POA: Diagnosis not present

## 2020-02-26 DIAGNOSIS — I2510 Atherosclerotic heart disease of native coronary artery without angina pectoris: Secondary | ICD-10-CM | POA: Diagnosis not present

## 2020-02-26 HISTORY — DX: Malignant (primary) neoplasm, unspecified: C80.1

## 2020-02-26 MED ORDER — SULFAMETHOXAZOLE-TRIMETHOPRIM 800-160 MG PO TABS
1.0000 | ORAL_TABLET | Freq: Two times a day (BID) | ORAL | 0 refills | Status: AC
Start: 2020-02-26 — End: 2020-03-04

## 2020-02-26 MED ORDER — IOHEXOL 300 MG/ML  SOLN
60.0000 mL | Freq: Once | INTRAMUSCULAR | Status: AC | PRN
  Administered 2020-02-26: 60 mL via INTRAVENOUS

## 2020-02-26 NOTE — Telephone Encounter (Signed)
Per Edison Simon PA-C he is aware that the patient did not stay at the ER. He did encourage her to remain there yesterday. Her CT Chest has been authorized by her insurance and we will get her scheduled to have this done as soon as possible.  The patient is still refusing to go to the ER due to the long wait.

## 2020-02-26 NOTE — Telephone Encounter (Signed)
Patient called and stated that she wants a nurse to call her back. She went to the ER but left and didn't get seen due to a 7-8 wait time. Please call and advise

## 2020-02-26 NOTE — Telephone Encounter (Signed)
Spoke with the patient and she will go to [facility], [facility] entrance, now for a stat CT of the chest. She is aware not to eat or drink anything prior.

## 2020-02-26 NOTE — Progress Notes (Signed)
Patient was referred for CBC, BMP, and CT Chest after my evaluation on Tuesday 08/17 secondary to concerns over possible abscess and wound infection. Lab work did not reveal a leukocytosis but did show significant hyperglycemia >600. After this findings, she was referred to the ED for work up, however, she left without being seen despite my strong recommendation. Our office attempted to schedule CT Chest today however there continue to be issues with insurance. She continues to refuse to present to the ED.   Plan: -- I will send in 7 days of Bactrim BID to as a precaution -- We will attempt to complete CT Chest as outpatient -- If her glucose remains >500, she was encouraged to return to the ED or present urgently to her PCP for management -- If she develops fever, worsening pain, or erythema around the wound be, she needs to present to the ED and will likely require IV Abx  We will call to update patient on this plan  -- Edison Simon, PA-C [facility] 02/26/2020, 1:16 PM [phone] M-F: 7am - 4pm

## 2020-02-27 ENCOUNTER — Telehealth: Payer: Self-pay

## 2020-02-27 DIAGNOSIS — F410 Panic disorder [episodic paroxysmal anxiety] without agoraphobia: Secondary | ICD-10-CM | POA: Diagnosis not present

## 2020-02-27 DIAGNOSIS — F3342 Major depressive disorder, recurrent, in full remission: Secondary | ICD-10-CM | POA: Diagnosis not present

## 2020-02-27 NOTE — Telephone Encounter (Signed)
Called patient and notified of CT Chest results per Edison Simon PA-C. Nothing alarming found on CT. She will continue her antibiotics due to bacterial growth from culture. She will follow up Tuesday morning with Dr Christian Mate. He husband reports there is no longer a smell to the wound. Let him know that was good, but to keep an eye on her sugar levels. He reports that her sugar yesterday am was 158 and her pm level was 382. He states that this morning she was at 172. He is aware to contact her PCP if sugars get above 500.

## 2020-02-29 DIAGNOSIS — C50911 Malignant neoplasm of unspecified site of right female breast: Secondary | ICD-10-CM | POA: Diagnosis not present

## 2020-02-29 LAB — ANAEROBIC AND AEROBIC CULTURE

## 2020-03-03 ENCOUNTER — Encounter: Payer: Self-pay | Admitting: Surgery

## 2020-03-03 ENCOUNTER — Other Ambulatory Visit: Payer: Self-pay

## 2020-03-03 ENCOUNTER — Ambulatory Visit (INDEPENDENT_AMBULATORY_CARE_PROVIDER_SITE_OTHER): Payer: BC Managed Care – PPO | Admitting: Surgery

## 2020-03-03 VITALS — BP 102/67 | HR 87 | Temp 98.3°F | Ht 64.0 in | Wt 279.0 lb

## 2020-03-03 DIAGNOSIS — L03319 Cellulitis of trunk, unspecified: Secondary | ICD-10-CM | POA: Diagnosis not present

## 2020-03-03 DIAGNOSIS — L02219 Cutaneous abscess of trunk, unspecified: Secondary | ICD-10-CM

## 2020-03-03 IMAGING — DX DG FINGER INDEX 2+V*R*
3 series · 3 of 3 positions shown · non-contrast
Comparison: None.

CLINICAL DATA: Partial-thickness wound of index finger.

EXAM:
RIGHT INDEX FINGER 2+V

[finger ap]
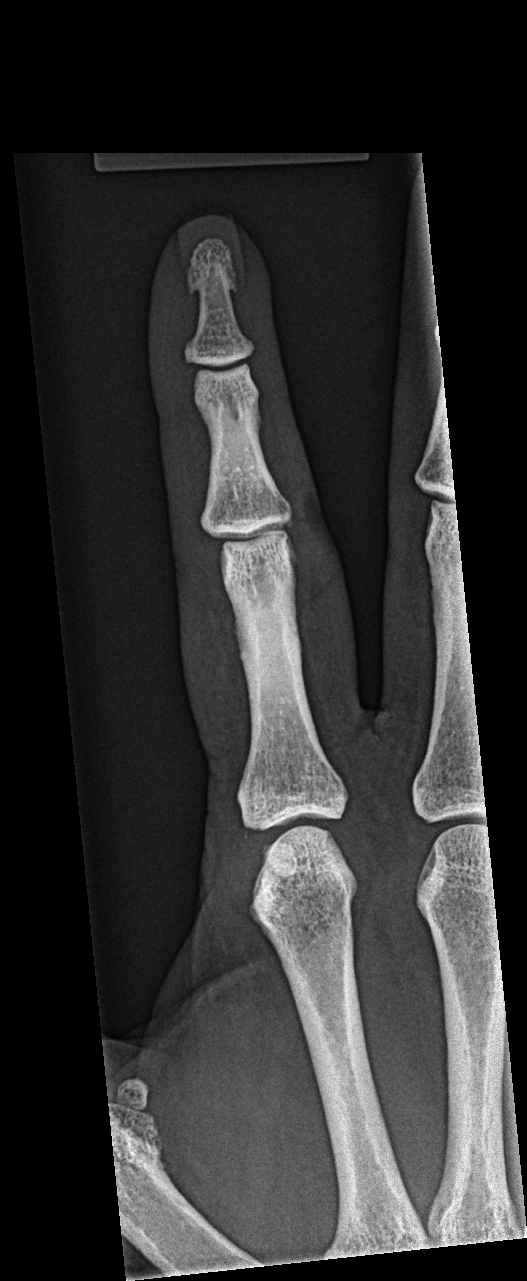

[finger obl]
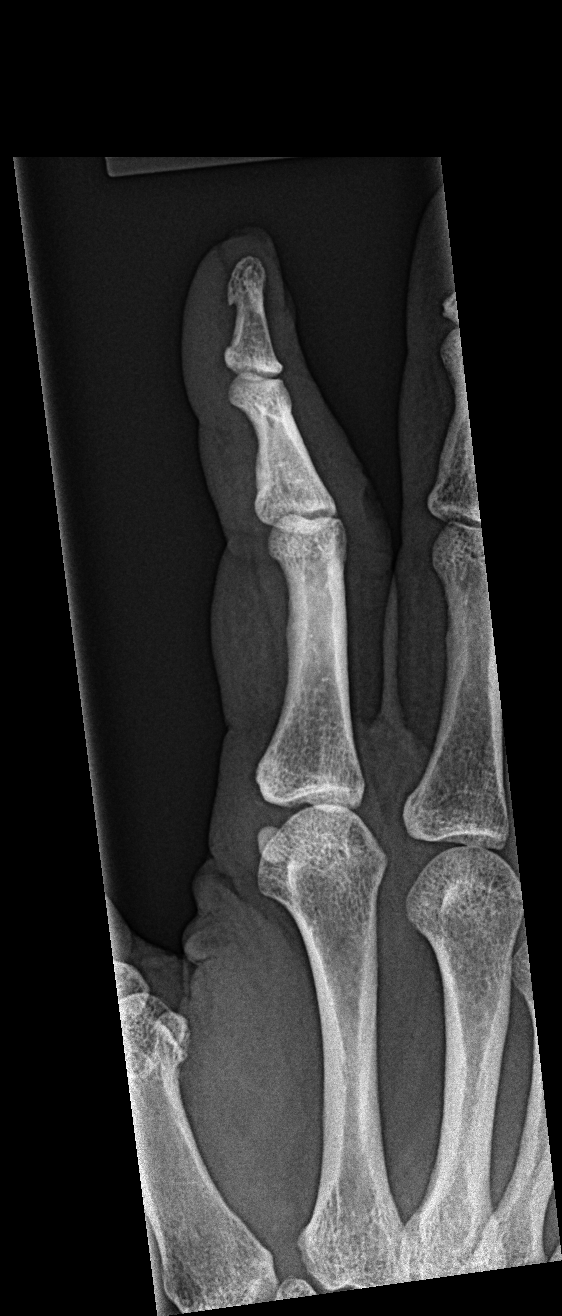

[finger lat]
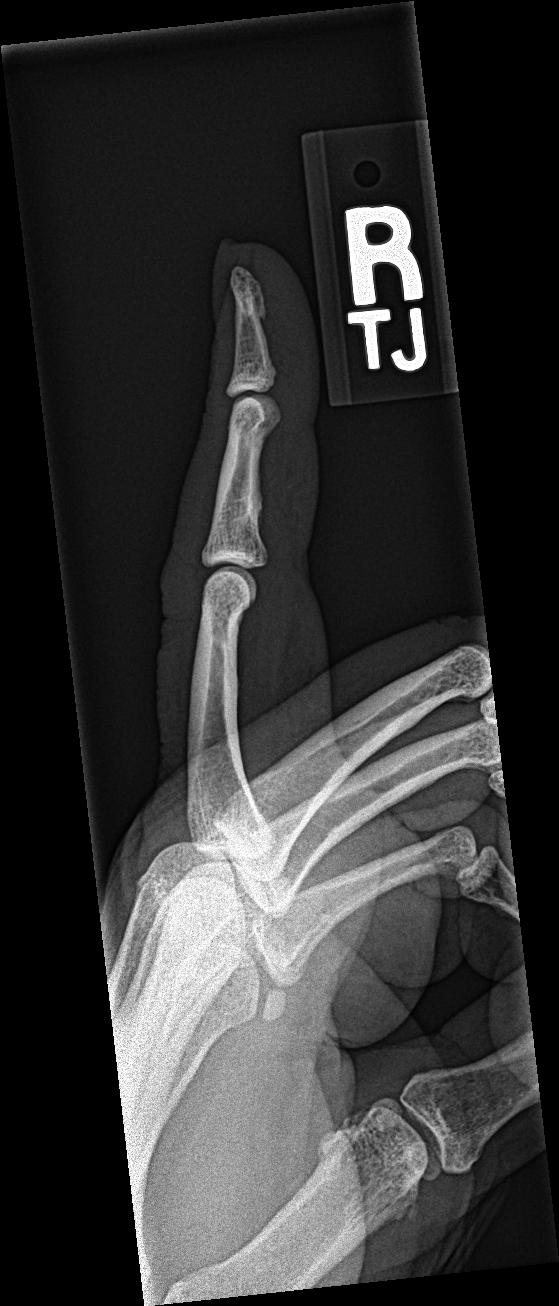

[3 of 3 positions shown; findings below may reference images not displayed]

FINDINGS: There is no evidence of fracture or dislocation. There is no
evidence of arthropathy or other focal bone abnormality. Soft tissue
wound is seen overlying proximal interphalangeal joint. No lytic
destruction is seen to suggest osteomyelitis. No radiopaque foreign
body.
IMPRESSION: No fracture or dislocation is noted. Soft tissue wound is seen
overlying proximal interphalangeal joint. No lytic destruction is
seen to suggest osteomyelitis. No radiopaque foreign body is noted.

## 2020-03-03 MED ORDER — OXYCODONE-ACETAMINOPHEN 7.5-325 MG PO TABS: 1.0000 | ORAL_TABLET | ORAL | 0 refills | Status: DC | PRN

## 2020-03-03 NOTE — Patient Instructions (Signed)
Pack the top wound with packing strip and use saline damp gauze with the mastectomy wound and change this daily, and cover with gauze.   Follow up in one week.

## 2020-03-03 NOTE — Progress Notes (Signed)
[facility] POST-OP OFFICE VISIT  03/03/2020  HPI: [name] is a 49 y.o. female who returns 6 days s/p her last visit for wound care.  Apparently she developed a significant amount of infection/abscess development of her right chest wall.  There has been a significant amount of improvement since then, discontinuing her wound VAC, performing wet to moist dressing changes on a regular basis and the addition of antibiotics.  She now presents with report that her wound size is significantly diminished, the odor has gone and she only has 2 doses of antibiotics left.  She denies fevers and chills.  Vital signs: BP 102/67   Pulse 87   Temp 98.3 F (36.8 C)   Ht 5\' 4"  (1.626 m)   Wt 279 lb (126.6 kg)   LMP 12/02/2019   SpO2 90%   BMI 47.89 kg/m    Physical Exam: Constitutional: She appears at her baseline, chronically ill but well today. Right chest wall, she has the open known mastectomy wound which is granulated nicely and diminishing in size.  When I began palpating the area of her upper right chest where I understood the potential abscess was located, with minimal palpation I was able to identify a punctate source of drainage within her wound that was draining this pus.  It was clear that my palpation of the abscess caused drainage to occur within her wound. Skin: The overlying skin and has areas without inflammation, induration nor erythema.  It is mildly tender at worst, but clearly the source of the purulent drainage is coming from pressing on this mass area.  In thorough discussion with the patient and her husband we elected to proceed with incision and drainage of this abscess here in the office.  We discussed the risks of local anesthesia, and the relative risks of more aggressive anesthetic care in the hospital and she understands she is better off under pure local anyway.  I explained to them both the importance of getting this abscess cavity drained to avoid  potential sepsis/worsening more threatening condition. With informed consent obtained, we proceeded with prepping the right chest wall on the medial cephalad flap of her prior mastectomy site.  Local anesthesia of 1% lidocaine with epinephrine was then infiltrated into the area with good anesthetic effect.  11 blade was utilized to make approximately a 4 cm incision that was about 3 cm in depth, I encountered the abscess cavity and was able to digitally examined the cavity which was larger than I anticipated and extended toward the patient's lateral side.  Incision I made was vertical so as not to end up with 2 parallel scars.  There were some venous, subcutaneous bleeders which were easily controlled with pressure.  I irrigated the cavity, began packing it with half-inch wide iodoform packing strip.  I applied direct pressure to diminish the oozing present and subsequently utilized a portable cautery device to treat the vessels identifiable.  This gave [place] adequate hemostasis.  I then proceeded with replacing the dressing over her chronic mastectomy site, and this new abscess site.  Instructions for packing this wound were reviewed in detail with the husband who is at the bedside the entire time.  Assessment/Plan: This is a 49 y.o. female with abscess under her cephalad post mastectomy flap, months postop. Now status post I&D. Patient Active Problem List   Diagnosis Date Noted  . Status post mastectomy, right 01/14/2020  . Acute kidney injury superimposed on CKD ([place])   . Breast cancer,  right breast ([place]) 12/23/2019  . Hyperlipidemia   . Carcinoma of lower-outer quadrant of right breast in female, estrogen receptor positive ([place]) 11/12/2019  . Restrictive lung disease 10/08/2019  . Postmenopausal bleeding 08/30/2019  . Carotid stenosis, asymptomatic, right 08/16/2019  . Nausea and vomiting 07/15/2019  . Cellulitis of left lower extremity 06/04/2019  . Multiple wounds of skin 06/04/2019  . Other  injury of unspecified body region, initial encounter 06/04/2019  . Left-sided weakness 09/13/2018  . Weakness of left lower extremity 09/05/2018  . Recurrent falls 09/05/2018  . PAD (peripheral artery disease) ([place]) 08/27/2018  . Chronic congestive heart failure ([place]) 08/02/2018  . Vaginal itching 06/15/2018  . Foot pain, bilateral 05/22/2018  . Chronic upper extremity pain (Secondary Area of Pain) (Bilateral) 04/23/2018  . Diabetic polyneuropathy associated with type 2 diabetes mellitus ([place]) 04/23/2018  . Long term prescription benzodiazepine use 04/23/2018  . Neurogenic pain 04/23/2018  . Vitamin D insufficiency 01/29/2018  . Elevated C-reactive protein (CRP) 01/29/2018  . Elevated sedimentation rate 01/29/2018  . Pressure injury of skin 01/29/2018  . Poorly controlled type 2 diabetes mellitus ([place]) 01/23/2018  . DNR (do not resuscitate) 01/23/2018  . CKD stage 3 due to type 2 diabetes mellitus ([place]) 01/23/2018  . IBS (irritable bowel syndrome) 01/19/2018  . Elevated serum hCG 01/19/2018  . Chronic lower extremity pain (Primary Area of Pain) (Bilateral) 01/08/2018  . Chronic low back pain [facility] Area of Pain) (Bilateral) w/ sciatica (Bilateral) 01/08/2018  . Chronic pain syndrome 01/08/2018  . Pharmacologic therapy 01/08/2018  . Disorder of skeletal system 01/08/2018  . Problems influencing health status 01/08/2018  . Long term current use of opiate analgesic 01/08/2018  . Restless leg syndrome 08/02/2017  . Nocturnal hypoxemia 03/29/2017  . Vision disturbance 02/28/2017  . CKD (chronic kidney disease), stage II 02/28/2017  . Visual loss, bilateral   . Chronic respiratory failure with hypoxia (HCC)/ nocturnal 02 dep  10/28/2016  . Upper airway cough syndrome 08/22/2016  . Cigarette smoker 06/15/2016  . Simple chronic bronchitis ([place])   . Coronary artery disease involving native heart without angina pectoris 05/16/2016  . Chronic diastolic CHF (congestive heart failure)  ([place]) 11/04/2015  . Orthopnea 11/03/2015  . Bilateral leg edema   . Diabetes ([place])   . COPD exacerbation ([place]) 10/15/2015  . Fracture of rib, closed 10/15/2015  . Venous stasis of both lower extremities 03/29/2015  . Preventative health care 02/04/2015  . Essential hypertension 01/02/2015  . Inguinal hernia 11/27/2014  . Allergic rhinitis with postnasal drip 01/21/2014  . Aortic valve disorders 04/18/2013  . Sleep apnea 05/22/2012  . Seizure disorder ([place]) 10/09/2011  . Bipolar disorder ([place]) 10/09/2011  . TIA (transient ischemic attack) 06/22/2011  . Constipation 06/15/2011  . HYPERTRIGLYCERIDEMIA 07/17/2007  . Situational anxiety 05/30/2007  . End stage COPD ([place]) 05/30/2007  . Morbid (severe) obesity due to excess calories ([place]) 04/23/2007  . Tobacco abuse 09/07/2006    -Wound care instructions reviewed with the patient and her husband.  We will have him follow-up in 1 to 2 weeks as needed.  We remain guarded.   Ronny Bacon M.D., FACS 03/03/2020, 12:29 PM

## 2020-03-06 ENCOUNTER — Telehealth: Payer: Self-pay | Admitting: Surgery

## 2020-03-06 NOTE — Telephone Encounter (Signed)
Pt called concerned that the incision site from the recent in-office px is sore, red & itchy since yesterday (03/05/20).  The area is not hot & no discharge, or swelling; however it is still being packed every morning.   Malachy Mood was not sure if Dr. Christian Mate felt she should be placed on an antibiotic knowing the weekend was on [place] & she is not due to return to the clinic until 03/10/20. Please advise by calling back @ [phone].  Thank you

## 2020-03-06 NOTE — Telephone Encounter (Signed)
Per Dr.Rodenberg patient does not require any antibiotics at this time as the abscess has already been open and drained and recommends to pack the wound regularly. Patient may use current pain medications for any soreness or pain she is experiencing, and may use other medication to help with the itchiness. Patient verbalized understanding and has no further questions.

## 2020-03-07 DIAGNOSIS — J9610 Chronic respiratory failure, unspecified whether with hypoxia or hypercapnia: Secondary | ICD-10-CM | POA: Diagnosis not present

## 2020-03-08 DIAGNOSIS — J449 Chronic obstructive pulmonary disease, unspecified: Secondary | ICD-10-CM | POA: Diagnosis not present

## 2020-03-10 ENCOUNTER — Encounter: Payer: BC Managed Care – PPO | Admitting: Surgery

## 2020-03-12 ENCOUNTER — Ambulatory Visit (INDEPENDENT_AMBULATORY_CARE_PROVIDER_SITE_OTHER): Payer: BC Managed Care – PPO | Admitting: Surgery

## 2020-03-12 ENCOUNTER — Other Ambulatory Visit: Payer: Self-pay

## 2020-03-12 ENCOUNTER — Encounter: Payer: Self-pay | Admitting: Surgery

## 2020-03-12 ENCOUNTER — Telehealth: Payer: Self-pay | Admitting: *Deleted

## 2020-03-12 VITALS — BP 85/57 | HR 79 | Temp 97.9°F | Ht 64.0 in | Wt 279.0 lb

## 2020-03-12 DIAGNOSIS — L02219 Cutaneous abscess of trunk, unspecified: Secondary | ICD-10-CM | POA: Insufficient documentation

## 2020-03-12 DIAGNOSIS — L03319 Cellulitis of trunk, unspecified: Secondary | ICD-10-CM

## 2020-03-12 HISTORY — DX: Cutaneous abscess of trunk, unspecified: L02.219

## 2020-03-12 HISTORY — DX: Cellulitis of trunk, unspecified: L03.319

## 2020-03-12 NOTE — Patient Instructions (Addendum)
Dr.Rodenberg suggest patient to try and sit in a chair in the shower and clean the wound.  Dr.Rodenberg suggest patient to continue to do dressing changes daily.  Dr.Rodenberg changed patient's dressing at today's visit.  Patient will follow up in two weeks with Dr.Rodenberg.  Wound Care, Adult Taking care of your wound properly can help to prevent pain, infection, and scarring. It can also help your wound to heal more quickly. How to care for your wound Wound care      Follow instructions from your health care provider about how to take care of your wound. Make sure you: ? Wash your hands with soap and water before you change the bandage (dressing). If soap and water are not available, use hand sanitizer. ? Change your dressing as told by your health care provider. ? Leave stitches (sutures), skin glue, or adhesive strips in place. These skin closures may need to stay in place for 2 weeks or longer. If adhesive strip edges start to loosen and curl up, you may trim the loose edges. Do not remove adhesive strips completely unless your health care provider tells you to do that.  Check your wound area every day for signs of infection. Check for: ? Redness, swelling, or pain. ? Fluid or blood. ? Warmth. ? Pus or a bad smell.  Ask your health care provider if you should clean the wound with mild soap and water. Doing this may include: ? Using a clean towel to pat the wound dry after cleaning it. Do not rub or scrub the wound. ? Applying a cream or ointment. Do this only as told by your health care provider. ? Covering the incision with a clean dressing.  Ask your health care provider when you can leave the wound uncovered.  Keep the dressing dry until your health care provider says it can be removed. Do not take baths, swim, use a hot tub, or do anything that would put the wound underwater until your health care provider approves. Ask your health care provider if you can take showers. You  may only be allowed to take sponge baths. Medicines   If you were prescribed an antibiotic medicine, cream, or ointment, take or use the antibiotic as told by your health care provider. Do not stop taking or using the antibiotic even if your condition improves.  Take over-the-counter and prescription medicines only as told by your health care provider. If you were prescribed pain medicine, take it 30 or more minutes before you do any wound care or as told by your health care provider. General instructions  Return to your normal activities as told by your health care provider. Ask your health care provider what activities are safe.  Do not scratch or pick at the wound.  Do not use any products that contain nicotine or tobacco, such as cigarettes and e-cigarettes. These may delay wound healing. If you need help quitting, ask your health care provider.  Keep all follow-up visits as told by your health care provider. This is important.  Eat a diet that includes protein, vitamin A, vitamin C, and other nutrient-rich foods to help the wound heal. ? Foods rich in protein include meat, dairy, beans, nuts, and other sources. ? Foods rich in vitamin A include carrots and dark green, leafy vegetables. ? Foods rich in vitamin C include citrus, tomatoes, and other fruits and vegetables. ? Nutrient-rich foods have protein, carbohydrates, fat, vitamins, or minerals. Eat a variety of healthy foods including vegetables, fruits,  and whole grains. Contact a health care provider if:  You received a tetanus shot and you have swelling, severe pain, redness, or bleeding at the injection site.  Your pain is not controlled with medicine.  You have redness, swelling, or pain around the wound.  You have fluid or blood coming from the wound.  Your wound feels warm to the touch.  You have pus or a bad smell coming from the wound.  You have a fever or chills.  You are nauseous or you vomit.  You are  dizzy. Get help right away if:  You have a red streak going away from your wound.  The edges of the wound open up and separate.  Your wound is bleeding, and the bleeding does not stop with gentle pressure.  You have a rash.  You faint.  You have trouble breathing. Summary  Always wash your hands with soap and water before changing your bandage (dressing).  To help with healing, eat foods that are rich in protein, vitamin A, vitamin C, and other nutrients.  Check your wound every day for signs of infection. Contact your health care provider if you suspect that your wound is infected. This information is not intended to replace advice given to you by your health care provider. Make sure you discuss any questions you have with your health care provider. Document Revised: 10/15/2018 Document Reviewed: 01/12/2016 Elsevier Patient Education  [place].

## 2020-03-12 NOTE — Progress Notes (Signed)
[name] crimes returns today for follow-up of her right chest wall wounds.  She reportedly would not allow her husband/caregiver to do her dressing changes to the newly incised I&D site.  She was allowing him to do the chronic wound that continues to contract and diminish in size. I removed a rather exudative laden strip of packing from her upper chest wound.  There is no evidence of persistent contained abscess, there is minimal granulation at this wound despite its being a week. I advised to utilize shower cleansing to wash these wounds on a daily basis and a seem to have the means to do this.  I have also strongly cautioned [name] to let her husband do that necessary dressing changes to keep these abscesses from recurring. She does complain of nausea today but denies vomiting fevers or chills.  They will resume dressing changes to these wounds, I am avoiding prescribing additional narcotic pain medication for this chronic disease process.  [name] needs to tolerate minor inconveniences without the addition of additional narcotics.

## 2020-03-12 NOTE — Telephone Encounter (Signed)
Per Dr.Rodenberg advised clinical staff to contact patient and let her know that he is aware of he request and he would let patient know what the next recommended steps would be for her. Patient verbalized understanding and has no further questions.

## 2020-03-12 NOTE — Telephone Encounter (Signed)
Patient called and wanted to see if she can get some more pain medication, she was taking percocet. Please call and advise

## 2020-03-13 ENCOUNTER — Telehealth: Payer: Self-pay | Admitting: Surgery

## 2020-03-13 NOTE — Telephone Encounter (Signed)
Patient calling and asking if Dr Christian Mate will refill her Oxycodone. Per Dr Christian Mate he will not refill her medication at this point. When she returns he may be willing to refill this if she is compliant with having her dressing changes done correctly every day. She should only be taking this medication with dressing changes not for chronic pain as this is not related to her surgery.

## 2020-03-13 NOTE — Telephone Encounter (Signed)
Patient is calling and is asking if one of the nurses could give her a call back. Please call patient and advise. 

## 2020-03-17 ENCOUNTER — Telehealth: Payer: Self-pay | Admitting: *Deleted

## 2020-03-17 NOTE — Telephone Encounter (Signed)
error 

## 2020-03-18 ENCOUNTER — Telehealth: Payer: Self-pay | Admitting: *Deleted

## 2020-03-18 DIAGNOSIS — J449 Chronic obstructive pulmonary disease, unspecified: Secondary | ICD-10-CM | POA: Diagnosis not present

## 2020-03-18 NOTE — Telephone Encounter (Signed)
Olivia Mackie from [facility] called in regards to this patient, she stated that her wound, top incision is warm to the touch and looks infected. She stated that she wants [place] to see about getting more home health care for her- she has used [facility] in the past.   [facility] number is [phone] if you have any more questions.

## 2020-03-18 NOTE — Telephone Encounter (Signed)
Left detailed message for [name] to give our office a callback at her earliest convenience.

## 2020-03-18 NOTE — Telephone Encounter (Signed)
Olivia Mackie called back and was told that [facility] would not be able to come out to patient due to being non compliant. Olivia Mackie asked if maybe we can get her a prescription for antibiotic due to the top of the wound is very painful and looks infected. She stated that when she changes her dressing its very painful.   Let Olivia Mackie know what we can do to help her number is [phone]

## 2020-03-19 NOTE — Telephone Encounter (Signed)
Spoke with patient per Dr.Rodenberg-continue to monitor blood sugars and wash the wound with soap and water. Please keep scheduled appointmnet next week and use Tylenol for pain.

## 2020-03-26 ENCOUNTER — Other Ambulatory Visit: Payer: Self-pay

## 2020-03-26 ENCOUNTER — Encounter: Payer: Self-pay | Admitting: Surgery

## 2020-03-26 ENCOUNTER — Other Ambulatory Visit: Payer: Self-pay | Admitting: Endocrinology

## 2020-03-26 ENCOUNTER — Encounter: Payer: Self-pay | Admitting: Family Medicine

## 2020-03-26 ENCOUNTER — Telehealth (INDEPENDENT_AMBULATORY_CARE_PROVIDER_SITE_OTHER): Payer: BC Managed Care – PPO | Admitting: Family Medicine

## 2020-03-26 DIAGNOSIS — E1165 Type 2 diabetes mellitus with hyperglycemia: Secondary | ICD-10-CM

## 2020-03-26 DIAGNOSIS — I5032 Chronic diastolic (congestive) heart failure: Secondary | ICD-10-CM | POA: Diagnosis not present

## 2020-03-26 DIAGNOSIS — Z20822 Contact with and (suspected) exposure to covid-19: Secondary | ICD-10-CM | POA: Diagnosis not present

## 2020-03-26 MED ORDER — GUAIFENESIN-CODEINE 100-10 MG/5ML PO SOLN: 5.0000 mL | Freq: Three times a day (TID) | ORAL | 0 refills | Status: DC | PRN

## 2020-03-26 MED ORDER — BENZONATATE 100 MG PO CAPS: 100.0000 mg | ORAL_CAPSULE | Freq: Three times a day (TID) | ORAL | 0 refills | Status: DC | PRN

## 2020-03-26 NOTE — Progress Notes (Signed)
I connected with [name] on 03/26/20 at  3:20 PM EDT by video and verified that I am speaking with the correct person using two identifiers.   I discussed the limitations, risks, security and privacy concerns of performing an evaluation and management service by video and the availability of in person appointments. I also discussed with the patient that there may be a patient responsible charge related to this service. The patient expressed understanding and agreed to proceed.  Patient location: parked Provider Location: [place] Participants: [name] and Briseida Sweaney   Subjective:     [name] is a 49 y.o. female presenting for Diarrhea (x 2.5 weeks ), Cough (x 2.5 weeks ), and Headache (x 2.5 weeks )     HPI   #Cough - with diarrhea - now feeling weak - has not been vaccinated against covid due to open wounds from breast cancer - no known exposure to covid - only going to the doctor - did not get tested for covid - does have chronic headaches - diarrhea - no diarrhea in the last 2 days  - took alkaseltzer which seemed to help with the diarrhea - is starting to feel like she needs to have a BM - was having 3-4 BM daily until a few days ago - coughing up clear phlegm  Treated for cellulitis - completed abx about 3 weeks ago Was suppose to have a check up today with Dr. Christian Mate  Coughing from the chest hurts the chest because of the wounds from breast cancer   Weight is up 10 lbs - 291 lbs - over the last 2 weeks Has been taking her fluid pills  Has home nurse that visits  Sees Dr. Ubaldo Glassing with [facility]   Review of Systems  Constitutional: Positive for fatigue. Negative for chills and fever.  HENT: Positive for congestion and rhinorrhea.   Respiratory: Positive for cough and shortness of breath.   Gastrointestinal: Positive for diarrhea. Negative for abdominal pain, nausea and vomiting.  Musculoskeletal: Negative for arthralgias and  myalgias.     Social History   Tobacco Use  Smoking Status Current Some Day Smoker  . Packs/day: 0.50  . Years: 36.00  . Pack years: 18.00  . Types: Cigarettes  . Start date: 07/11/1981  Smokeless Tobacco Never Used  Tobacco Comment   1 ppd now        Objective:   BP Readings from Last 3 Encounters:  03/12/20 (!) 85/57  03/03/20 102/67  02/25/20 (!) 125/47   Wt Readings from Last 3 Encounters:  03/12/20 279 lb (126.6 kg)  03/03/20 279 lb (126.6 kg)  02/25/20 280 lb (127 kg)    There were no vitals taken for this visit.  Physical Exam Constitutional:      Appearance: Normal appearance. She is not ill-appearing.  HENT:     Head: Normocephalic and atraumatic.     Right Ear: External ear normal.     Left Ear: External ear normal.  Eyes:     Conjunctiva/sclera: Conjunctivae normal.  Pulmonary:     Effort: Pulmonary effort is normal. No respiratory distress.  Neurological:     Mental Status: She is alert. Mental status is at baseline.  Psychiatric:        Mood and Affect: Mood normal.        Behavior: Behavior normal.        Thought Content: Thought content normal.        Judgment: Judgment  normal.          Assessment & Plan:   Problem List Items Addressed This Visit      Cardiovascular and Mediastinum   Chronic diastolic CHF (congestive heart failure) (HCC)    Pt notes 10 lb weight gain, though with normal urine output and taking her diuretic. Unable to assess fluid status due to virtual visit and with cough - though productive and fatigue. At this point given diarrhea lower suspicion for CHF exacerbation though recommended that she call cardiology to discuss weight gain and see if follow-up or medication changes would be indicated.        Other Visit Diagnoses    Suspected 2019 novel coronavirus infection    -  Primary   Relevant Medications   benzonatate (TESSALON PERLES) 100 MG capsule   guaiFENesin-codeine 100-10 MG/5ML syrup     Unvaccinated  pt with recent Abx use 2/2 to skin infection who notes 2.5 weeks of diarrhea, cough, headache. Advised covid testing. Medication for cough. Encouraged hydration.   Update with results of covid test for treatment options. Diarrhea improving so would not do additional stool studies at this time. But could consider C diff due to recent abx use.    Return if symptoms worsen or fail to improve.  Nancy [name], MD

## 2020-03-26 NOTE — Progress Notes (Signed)
Patient ID: [name], female   DOB: 17-Mar-1971, 49 y.o.   MRN: [id number] Received call from [place] care nurse, with concerns that patient not taking Tamoxifen as prescribed.  Contacted patient ,and husband.  Virtual visit with Dr. [name] scheduled for 03/26/20 at 2:30.  Patient aware.  Mychart access code sent to patient.  Notified palliative care. Patient is seeing primary NP this afeternoon at 3:00.

## 2020-03-26 NOTE — Assessment & Plan Note (Signed)
Pt notes 10 lb weight gain, though with normal urine output and taking her diuretic. Unable to assess fluid status due to virtual visit and with cough - though productive and fatigue. At this point given diarrhea lower suspicion for CHF exacerbation though recommended that she call cardiology to discuss weight gain and see if follow-up or medication changes would be indicated.

## 2020-03-27 ENCOUNTER — Other Ambulatory Visit: Payer: Self-pay

## 2020-03-27 ENCOUNTER — Inpatient Hospital Stay: Payer: BC Managed Care – PPO | Attending: Internal Medicine | Admitting: Internal Medicine

## 2020-03-27 ENCOUNTER — Encounter: Payer: Self-pay | Admitting: Internal Medicine

## 2020-03-27 DIAGNOSIS — N189 Chronic kidney disease, unspecified: Secondary | ICD-10-CM | POA: Diagnosis not present

## 2020-03-27 DIAGNOSIS — Z79810 Long term (current) use of selective estrogen receptor modulators (SERMs): Secondary | ICD-10-CM | POA: Diagnosis not present

## 2020-03-27 DIAGNOSIS — Z79899 Other long term (current) drug therapy: Secondary | ICD-10-CM | POA: Diagnosis not present

## 2020-03-27 DIAGNOSIS — F329 Major depressive disorder, single episode, unspecified: Secondary | ICD-10-CM | POA: Diagnosis not present

## 2020-03-27 DIAGNOSIS — F419 Anxiety disorder, unspecified: Secondary | ICD-10-CM | POA: Insufficient documentation

## 2020-03-27 DIAGNOSIS — Z794 Long term (current) use of insulin: Secondary | ICD-10-CM | POA: Insufficient documentation

## 2020-03-27 DIAGNOSIS — E1122 Type 2 diabetes mellitus with diabetic chronic kidney disease: Secondary | ICD-10-CM | POA: Diagnosis not present

## 2020-03-27 DIAGNOSIS — I509 Heart failure, unspecified: Secondary | ICD-10-CM | POA: Insufficient documentation

## 2020-03-27 DIAGNOSIS — E1142 Type 2 diabetes mellitus with diabetic polyneuropathy: Secondary | ICD-10-CM | POA: Insufficient documentation

## 2020-03-27 DIAGNOSIS — J449 Chronic obstructive pulmonary disease, unspecified: Secondary | ICD-10-CM | POA: Diagnosis not present

## 2020-03-27 DIAGNOSIS — C50511 Malignant neoplasm of lower-outer quadrant of right female breast: Secondary | ICD-10-CM | POA: Diagnosis not present

## 2020-03-27 DIAGNOSIS — Z170 Estrogen receptor positive status [ER+]: Secondary | ICD-10-CM | POA: Diagnosis not present

## 2020-03-27 MED ORDER — TAMOXIFEN CITRATE 20 MG PO TABS: 20.0000 mg | ORAL_TABLET | Freq: Every day | ORAL | 1 refills | Status: DC

## 2020-03-27 NOTE — Assessment & Plan Note (Addendum)
#   T2N0- ER/PR-positive; poor candidate for chemotherapy. Check oncotype for risk assessment.   #Recommend continue tamoxifen; recommend compliance. [See discussion below regarding tracking with antidepressant].  Again reviewed the potential side effects including but not limited to hot flashes weight gain risk of stroke etc.  However benefits outweigh the risks.   #With regards to antihormone therapy-I discussed the alternative of using ovarian suppression plus AI; patient not interested.  She wants to continue tamoxifen.  #Depression/anxiety/bipolar-currently on Prozac.  Discussed the decreased efficacy of tamoxifen on Prozac.  Recommend alternative/new SSRIs.  Discussed with Dr.Kaur who kindly agrees to switch to a different SSRI.   #Chronic respiratory failure-CHF/COPD; stable.  #Poorly controlled diabetes-on insulin; followed endocrinology.  #Peripheral neuropathy grade 2-3; patient goes on with a walker/cane at baseline. STABLE.   #Stage III kidney disease-second diabetes-stable.  DISPOSITION: # Follow up in 3 months; MD; labs- cbc/cmp- -Dr. Jacinto Reap  Dr.Kaur/PCP

## 2020-03-27 NOTE — Progress Notes (Signed)
Patient called/ pre- screened for virtual appoinment today with oncologist. Complaints of pain with new surgical abscess removal. Patient states she has not been taking her Tamoxifen and needs a refill.

## 2020-03-27 NOTE — Progress Notes (Signed)
I connected with [name] on 03/27/2020 at  2:30 PM EDT by video enabled telemedicine visit and verified that I am speaking with the correct person using two identifiers.  I discussed the limitations, risks, security and privacy concerns of performing an evaluation and management service by telemedicine and the availability of in-person appointments. I also discussed with the patient that there may be a patient responsible charge related to this service. The patient expressed understanding and agreed to proceed.    Other persons participating in the visit and their role in the encounter: RN/medical reconciliation Patient's location: home Provider's location: office  Oncology History Overview Note  #April 2021-2.5 cm right breast mass [incidental-CT scan chest]  # April 2021- RIGHT BREAST [facility]; s/p ER- 51-90%; PR- 51-90%; Her- 2-NEG. G-2. [Dr.Rodenberg]; s/p  Masectomy- pT2 pN0 (sn); Tamoxifen; Poor candidate for chemo; check oncotype for risk assessment.   # DM- poorly controlled; Seizure disorder/Bipolar/TIA ; Diabetes PN- G-3 [cane/walker; falls]; COPD; OSA active smoker  # SURVIVORSHIP:   # GENETICS:   DIAGNOSIS: Right breast cancer  STAGE:    II     ;  GOALS: goal  CURRENT/MOST RECENT THERAPY : Tam     Carcinoma of lower-outer quadrant of right breast in female, estrogen receptor positive ([place])  11/12/2019 Initial Diagnosis   Carcinoma of lower-outer quadrant of right breast in female, estrogen receptor positive ([place])     Chief Complaint: Breast cancer  History of present illness:[name] 49 y.o.  female with history of multiple medical problems including psychiatric history of bipolar/depression; poorly controlled diabetes/CKD is currently here for follow-up for her breast cancer post surgery.  Patient had a surgery in April 2021; however not followed up given multiple complication post surgery including wound healing problems secondary diabetes.  Currently states her  wound is healed.  She was started on tamoxifen even prior to surgery; however discontinued as she did not refill her prescription.   Patient was recently started back on Zoloft as per her psychiatrist.   Patient continues to complain of multiple problems including her neuropathy, difficulties breathing.  Chronic back pain and also pain from her surgery but seem to be chronic.  LABORATORY DATA:  I have reviewed the data as listed    Component Value Date/Time   NA 129 (L) 02/25/2020 1504   NA 137 01/08/2018 1117   K 4.2 02/25/2020 1504   CL 88 (L) 02/25/2020 1504   CO2 28 02/25/2020 1504   GLUCOSE 617 (HH) 02/25/2020 1504   BUN 40 (H) 02/25/2020 1504   BUN 31 (H) 01/08/2018 1117   CREATININE 1.39 (H) 02/25/2020 1504   CREATININE 1.59 (H) 07/18/2019 1506   CALCIUM 8.9 02/25/2020 1504   PROT 9.1 (H) 02/25/2020 1504   PROT 7.8 01/08/2018 1117   ALBUMIN 3.5 02/25/2020 1504   ALBUMIN 4.1 01/08/2018 1117   AST 20 02/25/2020 1504   ALT 20 02/25/2020 1504   ALKPHOS 186 (H) 02/25/2020 1504   BILITOT 1.2 02/25/2020 1504   BILITOT 0.5 01/08/2018 1117   GFRNONAA 44 (L) 02/25/2020 1504   GFRAA 51 (L) 02/25/2020 1504    No results found for: SPEP, UPEP  Lab Results  Component Value Date   WBC 8.3 02/25/2020   NEUTROABS 6.4 02/25/2020   HGB 11.3 (L) 02/25/2020   HCT 36.9 02/25/2020   MCV 78.8 (L) 02/25/2020   PLT 227 02/25/2020      Chemistry      Component Value Date/Time   NA 129 (L)  02/25/2020 1504   NA 137 01/08/2018 1117   K 4.2 02/25/2020 1504   CL 88 (L) 02/25/2020 1504   CO2 28 02/25/2020 1504   BUN 40 (H) 02/25/2020 1504   BUN 31 (H) 01/08/2018 1117   CREATININE 1.39 (H) 02/25/2020 1504   CREATININE 1.59 (H) 07/18/2019 1506      Component Value Date/Time   CALCIUM 8.9 02/25/2020 1504   ALKPHOS 186 (H) 02/25/2020 1504   AST 20 02/25/2020 1504   ALT 20 02/25/2020 1504   BILITOT 1.2 02/25/2020 1504   BILITOT 0.5 01/08/2018 1117      Assessment and  plan: Carcinoma of lower-outer quadrant of right breast in female, estrogen receptor positive ([place]) # T2N0- ER/PR-positive; poor candidate for chemotherapy. Check oncotype for risk assessment.   #Recommend continue tamoxifen; recommend compliance. [See discussion below regarding tracking with antidepressant].  Again reviewed the potential side effects including but not limited to hot flashes weight gain risk of stroke etc.  However benefits outweigh the risks.   #With regards to antihormone therapy-I discussed the alternative of using ovarian suppression plus AI; patient not interested.  She wants to continue tamoxifen.  #Depression/anxiety/bipolar-currently on Prozac.  Discussed the decreased efficacy of tamoxifen on Prozac.  Recommend alternative/new SSRIs.  Discussed with Dr.Kaur who kindly agrees to switch to a different SSRI.   #Chronic respiratory failure-CHF/COPD; stable.  #Poorly controlled diabetes-on insulin; followed endocrinology.  #Peripheral neuropathy grade 2-3; patient goes on with a walker/cane at baseline. STABLE.   #Stage III kidney disease-second diabetes-stable.  DISPOSITION: # Follow up in 3 months; MD; labs- cbc/cmp- -Dr. B  Dr.Kaur/PCP  Follow-up instructions:  I discussed the assessment and treatment plan with the patient.  The patient was provided an opportunity to ask questions and all were answered.  The patient agreed with the plan and demonstrated understanding of instructions.  The patient was advised to call back or seek an in person evaluation if the symptoms worsen or if the condition fails to improve as anticipated.  Dr. Charlaine Dalton [place] at [facility] 03/29/2020 4:37 PM

## 2020-03-31 ENCOUNTER — Telehealth: Payer: Self-pay | Admitting: *Deleted

## 2020-03-31 NOTE — Telephone Encounter (Signed)
Call from Danella Maiers that patient psychiatrist has changed her Fluoxetine to Desvenlafaxine Succinate ER 100 mg every am

## 2020-04-01 NOTE — Telephone Encounter (Signed)
Go it. I spoke to pt's psychiatrist also.  GB

## 2020-04-02 ENCOUNTER — Telehealth: Payer: Self-pay | Admitting: Primary Care

## 2020-04-02 ENCOUNTER — Other Ambulatory Visit: Payer: Self-pay

## 2020-04-02 ENCOUNTER — Encounter: Payer: Self-pay | Admitting: Surgery

## 2020-04-02 ENCOUNTER — Ambulatory Visit (INDEPENDENT_AMBULATORY_CARE_PROVIDER_SITE_OTHER): Payer: BC Managed Care – PPO | Admitting: Surgery

## 2020-04-02 VITALS — BP 104/69 | HR 93 | Temp 97.7°F

## 2020-04-02 DIAGNOSIS — L03319 Cellulitis of trunk, unspecified: Secondary | ICD-10-CM

## 2020-04-02 DIAGNOSIS — L02219 Cutaneous abscess of trunk, unspecified: Secondary | ICD-10-CM | POA: Diagnosis not present

## 2020-04-02 NOTE — Telephone Encounter (Signed)
Do you mind calling to set  up for appointment

## 2020-04-02 NOTE — Telephone Encounter (Signed)
Pt aware.

## 2020-04-02 NOTE — Telephone Encounter (Signed)
This should be a 40 right? Might need I&D? Do you want me to work in?

## 2020-04-02 NOTE — Telephone Encounter (Signed)
Yes, okay to add in Tuesday at 12 pm slot.

## 2020-04-02 NOTE — Telephone Encounter (Signed)
Pt called to schedule an appointment she stated she has knot in groin area painful.  When she puts her legs together it pinches it.    No appointments offered appointment on Monday with another provider pt wanted to see kate only.  Can pt be worked in

## 2020-04-02 NOTE — Telephone Encounter (Signed)
Left message asking pt to call office.  Please let pt know kate can see her Tuesday 9/28 @ 12.   I have already made appointment if pt cannot come please r/s  40 min appointment

## 2020-04-02 NOTE — Patient Instructions (Addendum)
Dr.Rodenberg suggest patient to STOP packing the bottom wound. Advised patient to continue to clean the wound with warm, soapy water and pat dry. Take a Q-tip to dry the corners of the area.  Patient will follow up in 1 month.  Total or Modified Radical Mastectomy, Care After This sheet gives you information about how to care for yourself after your procedure. Your health care provider may also give you more specific instructions. If you have problems or questions, contact your health care provider. What can I expect after the procedure? After the procedure, it is common to have:  Pain.  Numbness.  Stiffness in the arm or shoulder.  Feelings of stress, sadness, or depression. If the lymph nodes under your arm were removed, you may have arm swelling, weakness, or numbness on the same side of your body as your surgery. Follow these instructions at home: Incision care   Follow instructions from your health care provider about how to take care of your incision. Make sure you: ? Wash your hands with soap and water before you change your bandage (dressing). If soap and water are not available, use hand sanitizer. ? Change your dressing as told by your health care provider. ? Leave stitches (sutures), skin glue, or adhesive strips in place. These skin closures may need to stay in place for 2 weeks or longer. If adhesive strip edges start to loosen and curl up, you may trim the loose edges. Do not remove adhesive strips completely unless your health care provider tells you to do that.  Check your incision area every day for signs of infection. Check for: ? Redness, swelling, or more pain. ? Fluid or blood. ? Warmth. ? Pus or a bad smell.  If you were sent home with a surgical drain in place, follow instructions from your health care provider about emptying it. Bathing  Do not take baths, swim, or use a hot tub until your health care provider approves. Ask your health care provider if you  may take showers. You may only be allowed to take sponge baths. Activity  Return to your normal activities as told by your health care provider. Ask your health care provider what activities are safe for you.  Avoid activities that take a lot of effort.  Be careful to avoid any activities that could cause an injury to your arm on the side of your surgery.  Do not lift anything that is heavier than 10 lb (4.5 kg), or the limit that you are told, until your health care provider says that it is safe.  Avoid lifting with the arm on the side of your surgery.  Do not carry heavy objects on your shoulder.  After your drain is removed, do exercises to prevent stiffness and swelling in your arm. Talk with your health care provider about which exercises are safe for you. General instructions  Take over-the-counter and prescription medicines only as told by your health care provider.  You may eat what you usually do.  Keep your arm raised (elevated) above the level of your heart when you are sitting or lying down.  Do not wear tight jewelry on your arm, wrist, or fingers on the side of your surgery.  You may be given a tight sleeve (compression bandage) to wear over your arm on the side of your surgery. Wear this sleeve as told by your health care provider.  Ask your health care provider when you can start wearing a bra or using a breast  prosthesis.  Before you are involved in certain procedures such as giving blood or having your blood pressure checked, tell all your health care providers if lymph nodes under your arm were removed. This is important information. Follow-up  Keep all follow-up visits as told by your health care provider. This is important.  Get checked for extra fluid around your lymph nodes (lymphedema) as often as told by your health care provider. Contact a health care provider if:  You have a fever.  Your pain medicine is not working.  Your arm swelling, weakness,  or numbness has not improved after a few weeks.  You have new swelling in your breast area or arm.  You have redness, swelling, or more pain in your incision area.  You have fluid or blood coming from your incision.  Your incision feels warm to the touch.  You have pus or a bad smell coming from your incision. Get help right away if:  You have very bad pain in your breast area or arm.  You have chest pain.  You have difficulty breathing. Summary  Follow instructions from your health care provider about how to take care of your incision. Check your incision area every day for signs of infection.  Ask your health care provider what activities are safe for you.  Keep all follow-up visits as told by your health care provider. This is important.  Make sure you know which symptoms should cause you to contact your health care provider or to get help right away. This information is not intended to replace advice given to you by your health care provider. Make sure you discuss any questions you have with your health care provider. Document Revised: 08/31/2018 Document Reviewed: 03/31/2017 Elsevier Patient Education  2020 [employer].

## 2020-04-02 NOTE — Progress Notes (Signed)
Surgical Clinic Progress/Follow-up Note   HPI:  49 y.o. Female presents to clinic for follow-up of her right chest wall/postmastectomy wound.  No longer doing dressing changes of the recent I&D site, as it appears to have healed.  Packing of the more open wound continues and is likely keeping it excessively moist for now.  She reports good blood sugars, and she actually looks much better.  Denies N/V, fever/chills, CP, or SOB.  Review of Systems:  Constitutional: denies fever/chills  ENT: denies sore throat, hearing problems  Respiratory: denies shortness of breath, wheezing  Cardiovascular: denies chest pain, palpitations  Gastrointestinal: denies abdominal pain, N/V, or diarrhea/and bowel function as per interval history Skin: Denies any other rashes or skin discolorations except post-surgical wounds as per interval history  Vital Signs:  BP 104/69   Pulse 93   Temp 97.7 F (36.5 C) (Oral)   SpO2 90%    Physical Exam:  Constitutional:  -- Obese body habitus  -- Awake, alert, and oriented x3  Pulmonary:  -- No crackles -- Equal breath sounds bilaterally -- Breathing non-labored at rest Cardiovascular:  -- S1, S2 present  -- No pericardial rubs  Gastrointestinal:  -- Soft and non-distended, non-tender Musculoskeletal / Integumentary:  -- Wounds or skin discoloration: None appreciated the vertical I&D site of her cephalad mastectomy flap has completely healed, she indicates it still tender but there is no evidence of induration, erythema or fluctuance present. The rather irregular horizontal wound from her mastectomy appears as though there is likely epidermal coverage throughout, however due to the persisting moisture of the dressings that are left for a day at a time is difficult to be certain that the skin will proceed with maturation. -- Extremities: B/L UE and LE FROM, hands and feet warm, edema   Laboratory studies:   Imaging: No new pertinent imaging available for  review   Assessment:  49 y.o. yo Female with a problem list including...  Patient Active Problem List   Diagnosis Date Noted  . Cellulitis and abscess of trunk 03/12/2020  . Status post mastectomy, right 01/14/2020  . Acute kidney injury superimposed on CKD ([place])   . Breast cancer, right breast ([place]) 12/23/2019  . Hyperlipidemia   . Carcinoma of lower-outer quadrant of right breast in female, estrogen receptor positive ([place]) 11/12/2019  . Restrictive lung disease 10/08/2019  . Postmenopausal bleeding 08/30/2019  . Carotid stenosis, asymptomatic, right 08/16/2019  . Nausea and vomiting 07/15/2019  . Cellulitis of left lower extremity 06/04/2019  . Multiple wounds of skin 06/04/2019  . Other injury of unspecified body region, initial encounter 06/04/2019  . Left-sided weakness 09/13/2018  . Weakness of left lower extremity 09/05/2018  . Recurrent falls 09/05/2018  . PAD (peripheral artery disease) ([place]) 08/27/2018  . Chronic congestive heart failure ([place]) 08/02/2018  . Vaginal itching 06/15/2018  . Foot pain, bilateral 05/22/2018  . Chronic upper extremity pain (Secondary Area of Pain) (Bilateral) 04/23/2018  . Diabetic polyneuropathy associated with type 2 diabetes mellitus ([place]) 04/23/2018  . Long term prescription benzodiazepine use 04/23/2018  . Neurogenic pain 04/23/2018  . Vitamin D insufficiency 01/29/2018  . Elevated C-reactive protein (CRP) 01/29/2018  . Elevated sedimentation rate 01/29/2018  . Pressure injury of skin 01/29/2018  . Poorly controlled type 2 diabetes mellitus ([place]) 01/23/2018  . DNR (do not resuscitate) 01/23/2018  . CKD stage 3 due to type 2 diabetes mellitus ([place]) 01/23/2018  . IBS (irritable bowel syndrome) 01/19/2018  . Elevated serum hCG 01/19/2018  .  Chronic lower extremity pain (Primary Area of Pain) (Bilateral) 01/08/2018  . Chronic low back pain [facility] Area of Pain) (Bilateral) w/ sciatica (Bilateral) 01/08/2018  . Chronic pain syndrome  01/08/2018  . Pharmacologic therapy 01/08/2018  . Disorder of skeletal system 01/08/2018  . Problems influencing health status 01/08/2018  . Long term current use of opiate analgesic 01/08/2018  . Restless leg syndrome 08/02/2017  . Nocturnal hypoxemia 03/29/2017  . Vision disturbance 02/28/2017  . CKD (chronic kidney disease), stage II 02/28/2017  . Visual loss, bilateral   . Chronic respiratory failure with hypoxia (HCC)/ nocturnal 02 dep  10/28/2016  . Upper airway cough syndrome 08/22/2016  . Cigarette smoker 06/15/2016  . Simple chronic bronchitis ([place])   . Coronary artery disease involving native heart without angina pectoris 05/16/2016  . Chronic diastolic CHF (congestive heart failure) ([place]) 11/04/2015  . Orthopnea 11/03/2015  . Bilateral leg edema   . Diabetes ([place])   . COPD exacerbation ([place]) 10/15/2015  . Fracture of rib, closed 10/15/2015  . Venous stasis of both lower extremities 03/29/2015  . Preventative health care 02/04/2015  . Essential hypertension 01/02/2015  . Inguinal hernia 11/27/2014  . Allergic rhinitis with postnasal drip 01/21/2014  . Aortic valve disorders 04/18/2013  . Sleep apnea 05/22/2012  . Seizure disorder ([place]) 10/09/2011  . Bipolar disorder ([place]) 10/09/2011  . TIA (transient ischemic attack) 06/22/2011  . Constipation 06/15/2011  . HYPERTRIGLYCERIDEMIA 07/17/2007  . Situational anxiety 05/30/2007  . End stage COPD ([place]) 05/30/2007  . Morbid (severe) obesity due to excess calories ([place]) 04/23/2007  . Tobacco abuse 09/07/2006    presents to clinic for follow-up of her right chest wound. Plan:               - okay to submerge incisions under water (baths, swimming) prn continue to cleanse and shower, and keep clean and dry.  No further packing or topical ointments to be applied.                          - return to clinic in a month, or as needed, instructed to call office if any questions or concerns  All of the above recommendations were  discussed with the patient and patient's family, and all of patient's and family's questions were answered to their expressed satisfaction.  Ronny Bacon, MD, FACS [place]: [facility]. Office: [phone]

## 2020-04-02 NOTE — Telephone Encounter (Signed)
Please mark pt aware of appointment and do covid screen

## 2020-04-07 ENCOUNTER — Ambulatory Visit: Payer: BC Managed Care – PPO | Admitting: Primary Care

## 2020-04-07 DIAGNOSIS — J9610 Chronic respiratory failure, unspecified whether with hypoxia or hypercapnia: Secondary | ICD-10-CM | POA: Diagnosis not present

## 2020-04-10 ENCOUNTER — Telehealth: Payer: Self-pay | Admitting: *Deleted

## 2020-04-10 NOTE — Telephone Encounter (Signed)
I will call pt later today.  GB

## 2020-04-10 NOTE — Telephone Encounter (Signed)
Patient called with concerns about her last video visit with Dr. Burlene Arnt. She expressed concern over still having cancer in her body and she does not know where it is. I reviewed Dr. Andre Lefort last note with her. She reiterated that she would like a call back from the Dr. Jaymes Graff his nurse.

## 2020-04-13 ENCOUNTER — Telehealth: Payer: Self-pay | Admitting: Internal Medicine

## 2020-04-13 NOTE — Telephone Encounter (Signed)
On 10/01-LVM for pt/returned call re: her concerns re: breast cancer diagnosis/treatment plan. GB

## 2020-04-13 NOTE — Telephone Encounter (Signed)
On 10/04-spoke to patient and her husband regarding her breast cancer treatment plan-continue tamoxifen.  Recommend good control of blood sugars.  And continue wound care.  Follow-up as planned. GB

## 2020-04-16 IMAGING — CR DG FINGER MIDDLE 2+V*R*
1 series · 3 of 3 positions shown · non-contrast
Comparison: 06/04/2019

CLINICAL DATA: Nonhealing wound

EXAM:
RIGHT MIDDLE FINGER 2+V

[Series 1: dg finger middle right · 0.14mm/px · 3 of 3 slices shown]
[im 1/3]
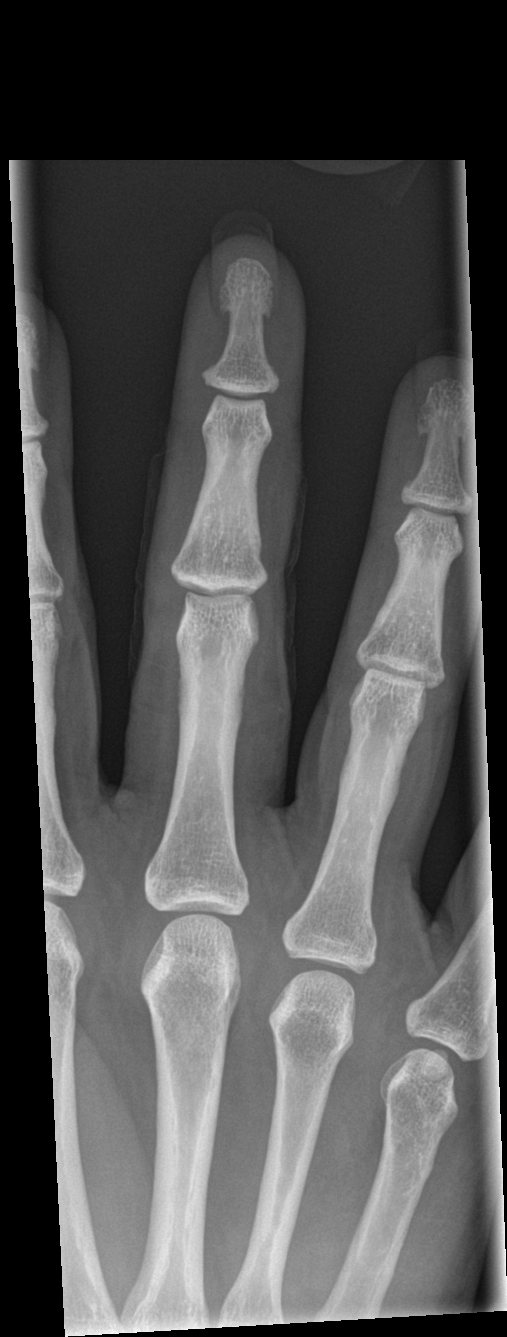
[im 2/3]
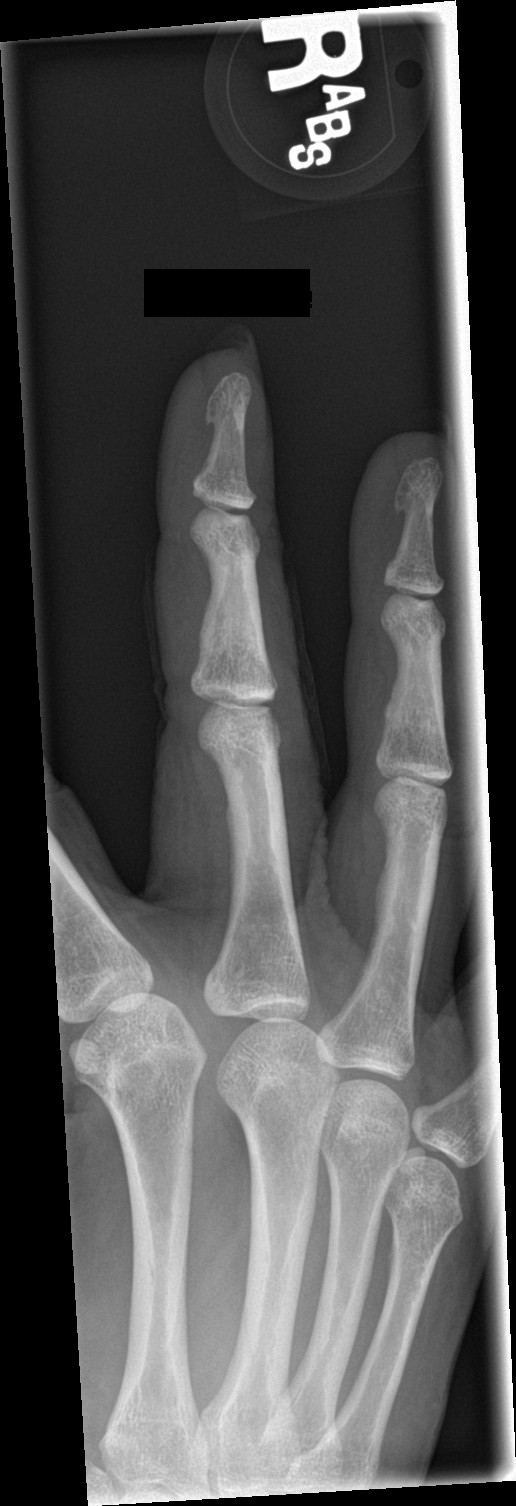
[im 3/3]
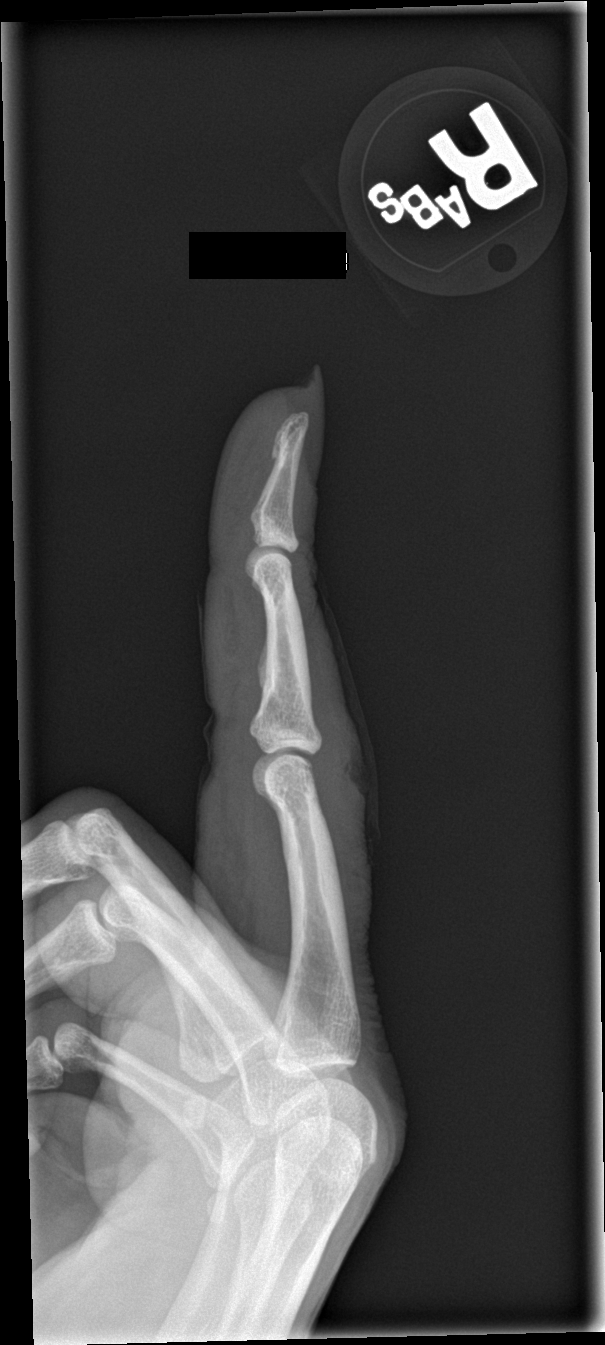

[3 of 3 positions shown; findings below may reference images not displayed]

FINDINGS: There is no evidence of fracture or dislocation. There is no
evidence of arthropathy or other focal bone abnormality. No cortical
destruction or periostitis to suggest osteomyelitis. There is soft
tissue irregularity over the dorsal aspect of the right long finger
at the level of the proximal interphalangeal joint. No radiopaque
foreign body.
IMPRESSION: Soft tissue irregularity over the dorsal aspect of the right long
finger at the level of the PIP joint. No radiographic evidence to
suggest osteomyelitis.

## 2020-04-17 ENCOUNTER — Other Ambulatory Visit: Payer: Self-pay | Admitting: Cardiology

## 2020-04-17 DIAGNOSIS — J449 Chronic obstructive pulmonary disease, unspecified: Secondary | ICD-10-CM | POA: Diagnosis not present

## 2020-04-20 ENCOUNTER — Ambulatory Visit: Payer: BC Managed Care – PPO | Admitting: Primary Care

## 2020-04-30 ENCOUNTER — Other Ambulatory Visit: Payer: Self-pay

## 2020-04-30 ENCOUNTER — Encounter: Payer: Self-pay | Admitting: Surgery

## 2020-04-30 ENCOUNTER — Ambulatory Visit (INDEPENDENT_AMBULATORY_CARE_PROVIDER_SITE_OTHER): Payer: BC Managed Care – PPO | Admitting: Surgery

## 2020-04-30 VITALS — BP 131/80 | HR 86 | Temp 97.9°F | Ht 64.0 in | Wt 268.0 lb

## 2020-04-30 DIAGNOSIS — L03319 Cellulitis of trunk, unspecified: Secondary | ICD-10-CM | POA: Diagnosis not present

## 2020-04-30 DIAGNOSIS — L02219 Cutaneous abscess of trunk, unspecified: Secondary | ICD-10-CM | POA: Diagnosis not present

## 2020-04-30 MED ORDER — NYSTATIN-TRIAMCINOLONE 100000-0.1 UNIT/GM-% EX OINT
1.0000 | TOPICAL_OINTMENT | Freq: Two times a day (BID) | CUTANEOUS | 1 refills | Status: DC
Start: 2020-04-30 — End: 2021-01-13

## 2020-04-30 NOTE — Progress Notes (Signed)
[name] presents today for follow-up of her right chest wall wound.  Current complaints involve a malodor.  She does have some itching inferior to this area.  Her wound has completely healed since.  The epidermis is dry without any evidence of weeping. She appears much healthier and brighter today, she is excited to report she is lost a good 30+ pounds since this all began. Her right chest wall wound is completely epidermal lysed, but there is residual pink discoloration likely from a lingering fungal irritation. There is no evidence of malodor today having been recently bathed.  Advised utilization of a nystatin triamcinolone cream twice daily, otherwise keeping this area dry and open to air and avoiding persistent moisture.  There is no need for routine follow-up for her wound as she is now healed. She will continue standard screening for her left breast. And she may follow-up with [place] as needed.

## 2020-04-30 NOTE — Patient Instructions (Signed)
Just use soap and water to clean the area. Dry it very well, then apply the ointment, use a small amount.  Follow-up with our office as needed.  Please call and ask to speak with a nurse if you develop questions or concerns.

## 2020-05-07 DIAGNOSIS — J9610 Chronic respiratory failure, unspecified whether with hypoxia or hypercapnia: Secondary | ICD-10-CM | POA: Diagnosis not present

## 2020-05-13 ENCOUNTER — Telehealth: Payer: Self-pay | Admitting: *Deleted

## 2020-05-13 NOTE — Telephone Encounter (Signed)
Spoke with Dr. [name], who recommended that patient be evaluated in [facility] this week. I personally reached out to the patient. Patient prescribed tamoxifen 20 mg on 03/27/2020.  Patient has no other symptoms but intermittent nausea. She has had no exposures to covid.  Patient agreeable to see [facility] NP at 10 am tomorrow. I offered a virtual visit, but patient preferred to be an in office apt.

## 2020-05-13 NOTE — Telephone Encounter (Signed)
Patient called asking if her December appointment could be moved up dur to nausea and she is not sure if it is the pill she is on or something else. Please advise.

## 2020-05-14 ENCOUNTER — Inpatient Hospital Stay: Payer: BC Managed Care – PPO | Attending: Nurse Practitioner | Admitting: Nurse Practitioner

## 2020-05-14 ENCOUNTER — Other Ambulatory Visit: Payer: Self-pay

## 2020-05-14 DIAGNOSIS — R110 Nausea: Secondary | ICD-10-CM | POA: Diagnosis not present

## 2020-05-14 DIAGNOSIS — Z79810 Long term (current) use of selective estrogen receptor modulators (SERMs): Secondary | ICD-10-CM | POA: Insufficient documentation

## 2020-05-14 DIAGNOSIS — R197 Diarrhea, unspecified: Secondary | ICD-10-CM | POA: Insufficient documentation

## 2020-05-14 DIAGNOSIS — Z170 Estrogen receptor positive status [ER+]: Secondary | ICD-10-CM | POA: Insufficient documentation

## 2020-05-14 DIAGNOSIS — C50511 Malignant neoplasm of lower-outer quadrant of right female breast: Secondary | ICD-10-CM | POA: Diagnosis not present

## 2020-05-14 NOTE — Progress Notes (Signed)
Virtual Visit Progress Note  [facility]  [phone] [phone]  I connected with [name] on 05/14/20 at 10:00 AM EDT by video enabled telemedicine visit and verified that I am speaking with the correct person using two identifiers.   I discussed the limitations, risks, security and privacy concerns of performing an evaluation and management service by telemedicine and the availability of in-person appointments. I also discussed with the patient that there may be a patient responsible charge related to this service. The patient expressed understanding and agreed to proceed.   Other persons participating in the visit and their role in the encounter: none  Patient's location: home Provider's location: clinic  Chief Complaint: Nausea & diarrhea    Patient Care Team: Pleas Koch, NP as PCP - General (Internal Medicine) Rosamaria Lints, MD (Inactive) as Referring Physician (Neurology) Alvester Chou, NP (Nurse Practitioner) Renato Shin, MD as Consulting Physician (Endocrinology) Theodore Demark, RN as Oncology Nurse Navigator Ronny Bacon, MD as Consulting Physician (General Surgery) [address]acey [address], [place] as Registered Nurse   Name of the patient: [name]  [id number]  18-Nov-1970   Date of visit: 05/14/20  Diagnosis- Breast Cancer  Chief complaint/ Reason for visit- Nausea & diarrhea  Heme/Onc history:  Oncology History Overview Note  #April 2021-2.5 cm right breast mass [incidental-CT scan chest]  # April 2021- RIGHT BREAST [facility]; s/p ER- 51-90%; PR- 51-90%; Her- 2-NEG. G-2. [Dr.Rodenberg]; s/p  Masectomy- pT2 pN0 (sn); Tamoxifen; Poor candidate for chemo; check oncotype for risk assessment.   # DM- poorly controlled; Seizure disorder/Bipolar/TIA ; Diabetes PN- G-3 [cane/walker; falls]; COPD; OSA active smoker  # SURVIVORSHIP:   # GENETICS:   DIAGNOSIS: Right breast  cancer  STAGE:    II     ;  GOALS: goal  CURRENT/MOST RECENT THERAPY : Tam     Carcinoma of lower-outer quadrant of right breast in female, estrogen receptor positive ([place])  11/12/2019 Initial Diagnosis   Carcinoma of lower-outer quadrant of right breast in female, estrogen receptor positive ([place])     Interval history- 49 year old female patient with above history of right breast cancer, poor candidate for chemotherapy, currently on tamoxifen, presents to [facility] for complaints of nausea and diarrhea. Says she goes to the bathroom multiple times a day and feels intermittently nauseous off and on throughout the day.  Blood sugars 90s-200s. Symptoms began prior to starting tamoxifen. No fevers or chills. No plushing, skin changes. Has leg swelling at baseline. No vaginal bleeding or discharge. Feels tired and has ongoing aches and pains.   Review of systems- Review of Systems  Constitutional: Positive for malaise/fatigue. Negative for chills, fever and weight loss.  HENT: Negative for hearing loss, nosebleeds, sore throat and tinnitus.   Eyes: Negative for blurred vision and double vision.  Respiratory: Negative for cough, hemoptysis, shortness of breath and wheezing.   Cardiovascular: Negative for chest pain, palpitations and leg swelling.  Gastrointestinal: Positive for diarrhea and nausea. Negative for abdominal pain, blood in stool, constipation, melena and vomiting.  Genitourinary: Negative for dysuria and urgency.  Musculoskeletal: Negative for back pain, falls, joint pain and myalgias.  Skin: Negative for itching and rash.  Neurological: Negative for dizziness, tingling, sensory change, loss of consciousness, weakness and headaches.  Endo/Heme/Allergies: Negative for environmental allergies. Does not bruise/bleed easily.  Psychiatric/Behavioral: Negative for depression. The patient is not nervous/anxious and does not have insomnia.      Current  treatment-  tamoxifen  Allergies  Allergen Reactions  . Adhesive [Tape] Rash and Other (See Comments)    TAKES OFF THE SKIN (CERTAIN MEDICAL TAPES DO THIS!!)  . Metoprolol Shortness Of Breath    Occurrence of shortness of breath after 3 days  . Montelukast Shortness Of Breath  . Morphine Sulfate Anaphylaxis, Shortness Of Breath and Nausea And Vomiting    Swollen Throat - Able to tolerate dilaudid  . Penicillins Anaphylaxis, Hives and Shortness Of Breath    Throat swells Has patient had a PCN reaction causing immediate rash, facial/tongue/throat swelling, SOB or lightheadedness with hypotension: Yes Has patient had a PCN reaction causing severe rash involving mucus membranes or skin necrosis: No Has patient had a PCN reaction that required hospitalization: Yes Has patient had a PCN reaction occurring within the last 10 years: No If all of the above answers are "NO", then may proceed with Cephalosporin use.   . Prednisone Anaphylaxis  . Diltiazem Swelling  . Gabapentin Swelling  . Midodrine     Lightheaded and falling down    Past Medical History:  Diagnosis Date  . Anxiety    takes Prozac daily  . Anxiety   . Aortic valve calcification   . Asthma    Advair and Spirva daily  . Asthma   . Bipolar disorder ([place])   . CAD (coronary artery disease)    a. LHC 11/2013 done for CP/fluid retention: mild disease in prox LAD, mild-mod disease in mRCA, EF 60% with normal LVEDP. b. Normal nuc 03/2016.  Marland Kitchen Cancer ([place])   . CHF (congestive heart failure) ([place])   . Chronic diastolic CHF (congestive heart failure) ([place])   . Chronic heart failure with preserved ejection fraction ([place]) 11/16/2018  . CKD (chronic kidney disease), stage II   . COPD (chronic obstructive pulmonary disease) ([facility])    a. nocturnal O2.  Marland Kitchen COPD (chronic obstructive pulmonary disease) ([place])   . Coronary artery disease   . Decreased urine stream   . Diabetes mellitus   . Diabetes mellitus without complication ([place])   . Dyspnea   .  Family history of adverse reaction to anesthesia    mom gets nauseated  . GERD (gastroesophageal reflux disease)    takes Pepcid daily  . History of blood clots    left leg 3-75yrs ago  . Hyperlipidemia   . Hypertension   . Hypertriglyceridemia   . Inguinal hernia, left 01/2015  . Muscle spasm   . Open wound of genital labia   . Peripheral neuropathy   . RBBB   . Seizures ([place])   . Sepsis ([place]) 01/19/2018  . Sinus tachycardia    a. persistent since 2009.  Marland Kitchen Smokers' cough ([place])   . Stroke [facility]) 1989   left sided weakness  . TIA (transient ischemic attack)   . Tobacco abuse   . Vulvar abscess 01/23/2018    Past Surgical History:  Procedure Laterality Date  . APPLICATION OF WOUND VAC Right 01/29/2020   Procedure: APPLICATION OF WOUND VAC;  Surgeon: Ronny Bacon, MD;  Location: [facility];  Service: General;  Laterality: Right;  . BREAST BIOPSY Right 11/06/2019   [place] core path pending venus clip  . HERNIA REPAIR Left   . INCISION AND DRAINAGE ABSCESS N/A 01/26/2018   Procedure: INCISION AND DEBRIDEMENT OF VULVAR NECROTIZING SOFT TISSUE INFECTION;  Surgeon: Greer Pickerel, MD;  Location: [place];  Service: General;  Laterality: N/A;  . INCISION AND DRAINAGE PERIRECTAL ABSCESS N/A 01/22/2018  Procedure: IRRIGATION AND DEBRIDEMENT LABIAL/VULVAR AREA;  Surgeon: Coralie Keens, MD;  Location: [place];  Service: General;  Laterality: N/A;  . INCISION AND DRAINAGE PERIRECTAL ABSCESS N/A 01/29/2018   Procedure: IRRIGATION AND DEBRIDEMENT VULVA;  Surgeon: Excell Seltzer, MD;  Location: [place];  Service: General;  Laterality: N/A;  . INGUINAL HERNIA REPAIR Left 04/08/2015   Procedure: OPEN LEFT INGUINAL HERNIA REPAIR WITH MESH;  Surgeon: Ralene Ok, MD;  Location: [place];  Service: General;  Laterality: Left;  . INSERTION OF MESH Left 04/08/2015   Procedure: INSERTION OF MESH;  Surgeon: Ralene Ok, MD;  Location: [place];  Service: General;  Laterality: Left;  . LAPAROSCOPY      Endometriosis  . LEFT HEART CATHETERIZATION WITH CORONARY ANGIOGRAM N/A 12/05/2013   Procedure: LEFT HEART CATHETERIZATION WITH CORONARY ANGIOGRAM;  Surgeon: Jettie Booze, MD;  Location: [facility] CATH LAB;  Service: Cardiovascular;  Laterality: N/A;  . right kidney drained    . SIMPLE MASTECTOMY WITH AXILLARY SENTINEL NODE BIOPSY Right 12/23/2019   Procedure: Right SIMPLE MASTECTOMY WITH AXILLARY SENTINEL NODE BIOPSY;  Surgeon: Ronny Bacon, MD;  Location: [facility];  Service: General;  Laterality: Right;  . TEE WITHOUT CARDIOVERSION N/A 11/28/2013   Procedure: TRANSESOPHAGEAL ECHOCARDIOGRAM (TEE);  Surgeon: Thayer Headings, MD;  Location: [place];  Service: Cardiovascular;  Laterality: N/A;  . WOUND DEBRIDEMENT Right 01/29/2020   Procedure: DEBRIDEMENT WOUND, Excisional debridement skin, subcutaneous and muscle right chest wall;  Surgeon: Ronny Bacon, MD;  Location: [facility];  Service: General;  Laterality: Right;    Social History   Socioeconomic History  . Marital status: Married    Spouse name: Not on file  . Number of children: 2  . Years of education: Not on file  . Highest education level: Not on file  Occupational History  . Not on file  Tobacco Use  . Smoking status: Current Some Day Smoker    Packs/day: 0.25    Years: 36.00    Pack years: 9.00    Types: Cigarettes    Start date: 07/11/1981  . Smokeless tobacco: Never Used  . Tobacco comment: 1 ppd now  Vaping Use  . Vaping Use: Some days  Substance and Sexual Activity  . Alcohol use: No  . Drug use: No  . Sexual activity: Not Currently  Other Topics Concern  . Not on file  Social History Narrative   ** Merged History Encounter **       Lives in [place]   Married but lives with Boyfriend - not legally separated   Disabled - for BiPolar, Seizure disorder, diabetes   Formerly worked at [employer] 1 ppd; no alcohol. 2 step sons; no biologic children.       Social Determinants of Health    Financial Resource Strain:   . Difficulty of Paying Living Expenses: Not on file  Food Insecurity:   . Worried About Charity fundraiser in the Last Year: Not on file  . Ran Out of Food in the Last Year: Not on file  Transportation Needs:   . Lack of Transportation (Medical): Not on file  . Lack of Transportation (Non-Medical): Not on file  Physical Activity:   . Days of Exercise per Week: Not on file  . Minutes of Exercise per Session: Not on file  Stress:   . Feeling of Stress : Not on file  Social Connections:   . Frequency of Communication with Friends and Family: Not  on file  . Frequency of Social Gatherings with Friends and Family: Not on file  . Attends Religious Services: Not on file  . Active Member of Clubs or Organizations: Not on file  . Attends Archivist Meetings: Not on file  . Marital Status: Not on file  Intimate Partner Violence:   . Fear of Current or Ex-Partner: Not on file  . Emotionally Abused: Not on file  . Physically Abused: Not on file  . Sexually Abused: Not on file    Family History  Problem Relation Age of Onset  . Venous thrombosis Brother   . Other Brother        BRAIN TUMOR  . Asthma Father   . Diabetes Father   . Coronary artery disease Mother   . Hypertension Mother   . Diabetes Mother   . Breast cancer Mother 12  . Asthma Sister   . Diabetes type II Brother      Current Outpatient Medications:  .  acetaminophen (TYLENOL) 500 MG tablet, Take 1,000 mg by mouth every 6 (six) hours as needed for moderate pain. , Disp: , Rfl:  .  albuterol (VENTOLIN HFA) 108 (90 Base) MCG/ACT inhaler, Inhale 2 puffs into the lungs every 6 (six) hours as needed for wheezing or shortness of breath., Disp: , Rfl:  .  ALPRAZolam (XANAX) 1 MG tablet, Take 1 mg by mouth 4 (four) times daily as needed for anxiety. , Disp: , Rfl:  .  aspirin EC 81 MG tablet, Take 81 mg by mouth daily before breakfast. , Disp: , Rfl:  .  benzonatate (TESSALON PERLES)  100 MG capsule, Take 1 capsule (100 mg total) by mouth 3 (three) times daily as needed for cough., Disp: 20 capsule, Rfl: 0 .  budesonide-formoterol (SYMBICORT) 80-4.5 MCG/ACT inhaler, INHALE 2 PUFFS BY MOUTH EVERY 12 HOURS TO PREVENT COUGH OR WHEEZING *RINSE MOUTH AFTER EACH USE* (Patient taking differently: Inhale 2 puffs into the lungs in the morning and at bedtime. ), Disp: 10.2 g, Rfl: 3 .  busPIRone (BUSPAR) 30 MG tablet, Take 30 mg by mouth 2 (two) times daily., Disp: , Rfl:  .  citalopram (CELEXA) 20 MG tablet, Take 1 tablet (20 mg total) by mouth daily., Disp: 30 tablet, Rfl: 2 .  Dapagliflozin-metFORMIN HCl ER 5-500 MG TB24, Take 1 tablet by mouth daily with breakfast., Disp: , Rfl:  .  desvenlafaxine (PRISTIQ) 100 MG 24 hr tablet, Take 100 mg by mouth daily., Disp: , Rfl:  .  famotidine (PEPCID) 20 MG tablet, TAKE 1 TABLET (20 MG TOTAL) BY MOUTH 2 (TWO) TIMES DAILY. FOR HEARTBURN. (Patient taking differently: Take 20 mg by mouth 2 (two) times daily. ), Disp: 180 tablet, Rfl: 1 .  fluconazole (DIFLUCAN) 100 MG tablet, Take 1 tablet (100 mg total) by mouth daily., Disp: 14 tablet, Rfl: 0 .  guaiFENesin-codeine 100-10 MG/5ML syrup, Take 5 mLs by mouth 3 (three) times daily as needed for cough., Disp: 120 mL, Rfl: 0 .  Insulin Pen Needle 32G X 4 MM MISC, Used to give insulin injections twice daily., Disp: 100 each, Rfl: 11 .  insulin regular human CONCENTRATED (HUMULIN R) 500 UNIT/ML injection, Inject 30 Units into the skin 3 (three) times daily with meals. , Disp: , Rfl:  .  Insulin Syringe-Needle U-100 (INSULIN SYRINGE 1CC/30GX1/2") 30G X 1/2" 1 ML MISC, 1 each by Does not apply route 2 (two) times daily. E11.9, Disp: 200 each, Rfl: 0 .  ipratropium-albuterol (DUONEB) 0.5-2.5 (3)  MG/3ML SOLN, Take 3 mLs by nebulization every 4 (four) hours as needed (wheezing/shortness of breath)., Disp: 360 mL, Rfl: 0 .  metolazone (ZAROXOLYN) 5 MG tablet, Take 5 mg by mouth daily. , Disp: , Rfl:  .   nystatin-triamcinolone ointment (MYCOLOG), Apply 1 application topically 2 (two) times daily., Disp: 30 g, Rfl: 1 .  ondansetron (ZOFRAN) 4 MG tablet, Take 4 mg by mouth 2 (two) times daily. , Disp: , Rfl:  .  OneTouch Delica Lancets 18H MISC, 1 each by Does not apply route 3 (three) times daily. Use to monitor glucose levels three times daily; E11.9; E10.42, Disp: 100 each, Rfl: 11 .  ONETOUCH VERIO test strip, USE AS DIRECTED TO CHECK BLOOD SUGAR 3 TIMES DAILY, Disp: 100 each, Rfl: 0 .  OXYGEN, Inhale 2-4 L into the lungs 2 (two) times daily as needed (shortness of breath). , Disp: , Rfl:  .  pregabalin (LYRICA) 300 MG capsule, TAKE 1 CAPSULE BY MOUTH TWICE DAILY, Disp: 180 capsule, Rfl: 0 .  rOPINIRole (REQUIP) 1 MG tablet, TAKE 1 TABLET BY MOUTH EVERY NIGHT AT BEDTIME FOR RESTLESS LEGS. (Patient taking differently: Take 1 mg by mouth at bedtime. ), Disp: 90 tablet, Rfl: 1 .  rosuvastatin (CRESTOR) 10 MG tablet, Take 1 tablet (10 mg total) by mouth daily. (Patient taking differently: Take 10 mg by mouth at bedtime. ), Disp: 90 tablet, Rfl: 3 .  spironolactone (ALDACTONE) 50 MG tablet, TAKE 1 TABLET BY MOUTH ONCE A DAY, Disp: 90 tablet, Rfl: 1 .  tamoxifen (NOLVADEX) 20 MG tablet, Take 1 tablet (20 mg total) by mouth daily., Disp: 90 tablet, Rfl: 1 .  torsemide (DEMADEX) 20 MG tablet, TAKE 1 TABLET BY MOUTH 4 TIMES DAILY (Patient taking differently: Take 20 mg by mouth 2 (two) times daily. ), Disp: 360 tablet, Rfl: 3 .  traZODone (DESYREL) 100 MG tablet, Take 200 mg by mouth at bedtime. , Disp: , Rfl:  .  ULTICARE MINI PEN NEEDLES 31G X 6 MM MISC, USE AS DIRECTED THREE TIMES DAILY, Disp: 100 each, Rfl: 0  Physical exam: Exam limited due to telemedicine   CMP Latest Ref Rng & Units 02/25/2020  Glucose 70 - 99 mg/dL 617(HH)  BUN 6 - 20 mg/dL 40(H)  Creatinine 0.44 - 1.00 mg/dL 1.39(H)  Sodium 135 - 145 mmol/L 129(L)  Potassium 3.5 - 5.1 mmol/L 4.2  Chloride 98 - 111 mmol/L 88(L)  CO2 22 - 32  mmol/L 28  Calcium 8.9 - 10.3 mg/dL 8.9  Total Protein 6.5 - 8.1 g/dL 9.1(H)  Total Bilirubin 0.3 - 1.2 mg/dL 1.2  Alkaline Phos 38 - 126 U/L 186(H)  AST 15 - 41 U/L 20  ALT 0 - 44 U/L 20   CBC Latest Ref Rng & Units 02/25/2020  WBC 4.0 - 10.5 K/uL 8.3  Hemoglobin 12.0 - 15.0 g/dL 11.3(L)  Hematocrit 36 - 46 % 36.9  Platelets 150 - 400 K/uL 227    No images are attached to the encounter.  No results found.  Assessment and plan- Patient is a 49 y.o. female diagnosed with right breast cancer, poor candidate for chemotherapy, currently on tamoxifen, who presents to symptom management clinic for nausea and diarrhea.  Etiology of her symptoms unclear.  Reviewed potential side effects of tamoxifen and again reviewed benefits versus risks.  Hold tamoxifen x 2 weeks to see if related to symptoms.  Again reviewed benefits versus risk.   Return to clinic in 2 weeks to reevaluate symptoms.  Visit Diagnosis 1. Carcinoma of lower-outer quadrant of right breast in female, estrogen receptor positive ([place])     Patient expressed understanding and was in agreement with this plan. She also understands that She can call clinic at any time with any questions, concerns, or complaints.   I discussed the assessment and treatment plan with the patient. The patient was provided an opportunity to ask questions and all were answered. The patient agreed with the plan and demonstrated an understanding of the instructions.   The patient was advised to call back or seek an in-person evaluation if the symptoms worsen or if the condition fails to improve as anticipated.   I provided 15 minutes of face-to-face video visit time during this encounter, and > 50% was spent counseling as documented under my assessment & plan.  Thank you for allowing me to participate in the care of this very pleasant patient.   Beckey Rutter, DNP, AGNP-C [facility]: Dr. [name]

## 2020-05-15 ENCOUNTER — Ambulatory Visit (INDEPENDENT_AMBULATORY_CARE_PROVIDER_SITE_OTHER): Payer: BC Managed Care – PPO | Admitting: Primary Care

## 2020-05-15 ENCOUNTER — Other Ambulatory Visit: Payer: Self-pay

## 2020-05-15 ENCOUNTER — Other Ambulatory Visit: Payer: Self-pay | Admitting: Primary Care

## 2020-05-15 ENCOUNTER — Ambulatory Visit (INDEPENDENT_AMBULATORY_CARE_PROVIDER_SITE_OTHER)
Admission: RE | Admit: 2020-05-15 | Discharge: 2020-05-15 | Disposition: A | Discharge status: Home / Self Care | Payer: BC Managed Care – PPO | Source: Ambulatory Visit | Attending: Primary Care | Admitting: Primary Care

## 2020-05-15 ENCOUNTER — Ambulatory Visit
Admission: RE | Admit: 2020-05-15 | Discharge: 2020-05-15 | Disposition: A | Discharge status: Home / Self Care | Payer: BC Managed Care – PPO | Source: Ambulatory Visit | Attending: Primary Care | Admitting: Primary Care

## 2020-05-15 ENCOUNTER — Encounter: Payer: Self-pay | Admitting: Primary Care

## 2020-05-15 VITALS — BP 110/60 | HR 74 | Temp 97.5°F | Ht 64.0 in | Wt 259.0 lb

## 2020-05-15 DIAGNOSIS — R29818 Other symptoms and signs involving the nervous system: Secondary | ICD-10-CM | POA: Diagnosis not present

## 2020-05-15 DIAGNOSIS — I739 Peripheral vascular disease, unspecified: Secondary | ICD-10-CM | POA: Insufficient documentation

## 2020-05-15 DIAGNOSIS — L97929 Non-pressure chronic ulcer of unspecified part of left lower leg with unspecified severity: Secondary | ICD-10-CM | POA: Insufficient documentation

## 2020-05-15 DIAGNOSIS — M24542 Contracture, left hand: Secondary | ICD-10-CM

## 2020-05-15 DIAGNOSIS — Z23 Encounter for immunization: Secondary | ICD-10-CM | POA: Diagnosis not present

## 2020-05-15 DIAGNOSIS — R4781 Slurred speech: Secondary | ICD-10-CM | POA: Diagnosis not present

## 2020-05-15 DIAGNOSIS — R0989 Other specified symptoms and signs involving the circulatory and respiratory systems: Secondary | ICD-10-CM

## 2020-05-15 DIAGNOSIS — I83023 Varicose veins of left lower extremity with ulcer of ankle: Secondary | ICD-10-CM | POA: Insufficient documentation

## 2020-05-15 DIAGNOSIS — R531 Weakness: Secondary | ICD-10-CM | POA: Insufficient documentation

## 2020-05-15 DIAGNOSIS — I70203 Unspecified atherosclerosis of native arteries of extremities, bilateral legs: Secondary | ICD-10-CM | POA: Diagnosis not present

## 2020-05-15 DIAGNOSIS — L97329 Non-pressure chronic ulcer of left ankle with unspecified severity: Secondary | ICD-10-CM | POA: Insufficient documentation

## 2020-05-15 DIAGNOSIS — M79642 Pain in left hand: Secondary | ICD-10-CM | POA: Diagnosis not present

## 2020-05-15 DIAGNOSIS — M24549 Contracture, unspecified hand: Secondary | ICD-10-CM | POA: Insufficient documentation

## 2020-05-15 LAB — COMPREHENSIVE METABOLIC PANEL
ALT: 19 U/L (ref 0–35)
AST: 16 U/L (ref 0–37)
Albumin: 3.7 g/dL (ref 3.5–5.2)
Alkaline Phosphatase: 129 U/L — ABNORMAL HIGH (ref 39–117)
BUN: 28 mg/dL — ABNORMAL HIGH (ref 6–23)
CO2: 29 mEq/L (ref 19–32)
Calcium: 9.4 mg/dL (ref 8.4–10.5)
Chloride: 97 mEq/L (ref 96–112)
Creatinine, Ser: 1.13 mg/dL (ref 0.40–1.20)
GFR: 56.41 mL/min — ABNORMAL LOW (ref >60.00)
Glucose, Bld: 292 mg/dL — ABNORMAL HIGH (ref 70–99)
Potassium: 4.3 mEq/L (ref 3.5–5.1)
Sodium: 134 mEq/L — ABNORMAL LOW (ref 135–145)
Total Bilirubin: 0.9 mg/dL (ref 0.2–1.2)
Total Protein: 8.1 g/dL (ref 6.0–8.3)

## 2020-05-15 LAB — LIPID PANEL
Cholesterol: 150 mg/dL (ref 0–200)
HDL: 24.3 mg/dL — ABNORMAL LOW (ref >39.00)
NonHDL: 126.18
Total CHOL/HDL Ratio: 6
Triglycerides: 284 mg/dL — ABNORMAL HIGH (ref 0.0–149.0)
VLDL: 56.8 mg/dL — ABNORMAL HIGH (ref 0.0–40.0)

## 2020-05-15 LAB — LDL CHOLESTEROL, DIRECT: Direct LDL: 97 mg/dL

## 2020-05-15 NOTE — Assessment & Plan Note (Signed)
Evidence of PAD to bilateral lower extremities.  Suspect ulcer to left lower extremity secondary to PAD.  Also with weak pulse to left dorsalis pedis.  Stat ABIs ordered today, especially in light of suspected recent stroke and significant medical history.  She will likely need vascular consultation, await results.

## 2020-05-15 NOTE — Assessment & Plan Note (Signed)
Evident on exam today.  History of tobacco abuse, PAD, uncontrolled diabetes.  Weak pulse noted to left dorsalis pedis, obtain stat ABIs today.  She would likely need vascular consultation. Referral placed for wound clinic. Hospice nurse to continue to monitor wound site.

## 2020-05-15 NOTE — Progress Notes (Signed)
Subjective:    Patient ID: [name], female    DOB: [birth year], 49 y.o.   MRN: [id number]  HPI  This visit occurred during the SARS-CoV-2 public health emergency.  Safety protocols were in place, including screening questions prior to the visit, additional usage of staff PPE, and extensive cleaning of exam room while observing appropriate contact time as indicated for disinfecting solutions.   [name] is a 49 year old female with a significant medical history including TIA, uncontrolled type 2 diabetes, CHF, COPD, tobacco abuse, breast cancer, seizure disorder, chronic pain syndrome, CKD, Bipolar Disorder, neuropathy, cellulitis who presents today with multiple issues.  1) Unilateral Weakness: Today she endorses a 2-3 day history of left sided weakness and numbness to upper and lower extremities.  Also with symptoms of slurred speech, difficulty with word finding, blurred vision.  Symptoms lasted for 2 to 3 days, have improved now but is with residual symptoms. She has also noticed her left hand "drawing in" to where she cannot hold anything and drops objects.  Her left third digit will contract and she will have to manually straighten.  She did not go to the hospital for these symptoms as the hospital is "too busy" and she did not want to wait hours to be seen.  She is compliant to rosuvastatin and aspirin 81 mg.  She follows with cardiology, no recent visit.  She underwent carotid artery ultrasound in February 2021 with 50 to 69% stenosis to right internal carotid artery, less than 50% to right external carotid artery.  Echocardiogram completed in March 2020 with LVEF of 55 to 60%.  She has no known history of atrial fibrillation.  2) Ulcer: History of extremity ulcers previously.  Also with history of PAD, tobacco abuse, uncontrolled diabetes.  Today she mentions that she has noticed an ulcer to the left anterior lower extremity for the last 6 months which is not improving.  She had her  hospice care nurse evaluate the site earlier this week.  She has noticed yellow/clear drainage from the site, keep the bandage over the site daily.  She no longer sees wound clinic, and has recently been released by general surgery for cellulitis and abscess to right mastectomy site.  Glucose readings have improved since weight loss which are running in the mid 150's to 200's. Follows with endocrinology.   BP Readings from Last 3 Encounters:  05/15/20 110/60  04/30/20 131/80  04/02/20 104/69   Wt Readings from Last 3 Encounters:  05/15/20 259 lb (117.5 kg)  04/30/20 268 lb (121.6 kg)  03/12/20 279 lb (126.6 kg)     Review of Systems  Constitutional: Negative for fever.  Eyes: Positive for visual disturbance.  Cardiovascular: Negative for chest pain.  Musculoskeletal: Negative for arthralgias.  Skin: Positive for color change and wound.  Neurological: Positive for weakness and numbness. Negative for dizziness.       Past Medical History:  Diagnosis Date  . Anxiety    takes Prozac daily  . Anxiety   . Aortic valve calcification   . Asthma    Advair and Spirva daily  . Asthma   . Bipolar disorder ([place])   . CAD (coronary artery disease)    a. LHC 11/2013 done for CP/fluid retention: mild disease in prox LAD, mild-mod disease in mRCA, EF 60% with normal LVEDP. b. Normal nuc 03/2016.  Marland Kitchen Cancer ([place])   . CHF (congestive heart failure) ([place])   . Chronic diastolic CHF (congestive heart failure) ([place])   .  Chronic heart failure with preserved ejection fraction ([place]) 11/16/2018  . CKD (chronic kidney disease), stage II   . COPD (chronic obstructive pulmonary disease) (H[facility])    a. nocturnal O2.  Marland Kitchen COPD (chronic obstructive pulmonary disease) ([place])   . Coronary artery disease   . Decreased urine stream   . Diabetes mellitus   . Diabetes mellitus without complication ([place])   . Dyspnea   . Family history of adverse reaction to anesthesia    mom gets nauseated  . GERD (gastroesophageal  reflux disease)    takes Pepcid daily  . History of blood clots    left leg 3-10yrs ago  . Hyperlipidemia   . Hypertension   . Hypertriglyceridemia   . Inguinal hernia, left 01/2015  . Muscle spasm   . Open wound of genital labia   . Peripheral neuropathy   . RBBB   . Seizures ([place])   . Sepsis ([place]) 01/19/2018  . Sinus tachycardia    a. persistent since 2009.  Marland Kitchen Smokers' cough ([place])   . Stroke [facility]) 1989   left sided weakness  . TIA (transient ischemic attack)   . Tobacco abuse   . Vulvar abscess 01/23/2018     Social History   Socioeconomic History  . Marital status: Married    Spouse name: Not on file  . Number of children: 2  . Years of education: Not on file  . Highest education level: Not on file  Occupational History  . Not on file  Tobacco Use  . Smoking status: Current Some Day Smoker    Packs/day: 0.25    Years: 36.00    Pack years: 9.00    Types: Cigarettes    Start date: 07/11/1981  . Smokeless tobacco: Never Used  . Tobacco comment: 1 ppd now  Vaping Use  . Vaping Use: Some days  Substance and Sexual Activity  . Alcohol use: No  . Drug use: No  . Sexual activity: Not Currently  Other Topics Concern  . Not on file  Social History Narrative   ** Merged History Encounter **       Lives in [place]   Married but lives with Boyfriend - not legally separated   Disabled - for BiPolar, Seizure disorder, diabetes   Formerly worked at [employer] 1 ppd; no alcohol. 2 step sons; no biologic children.       Social Determinants of Health   Financial Resource Strain:   . Difficulty of Paying Living Expenses: Not on file  Food Insecurity:   . Worried About Charity fundraiser in the Last Year: Not on file  . Ran Out of Food in the Last Year: Not on file  Transportation Needs:   . Lack of Transportation (Medical): Not on file  . Lack of Transportation (Non-Medical): Not on file  Physical Activity:   . Days of Exercise per Week: Not on file  .  Minutes of Exercise per Session: Not on file  Stress:   . Feeling of Stress : Not on file  Social Connections:   . Frequency of Communication with Friends and Family: Not on file  . Frequency of Social Gatherings with Friends and Family: Not on file  . Attends Religious Services: Not on file  . Active Member of Clubs or Organizations: Not on file  . Attends Archivist Meetings: Not on file  . Marital Status: Not on file  Intimate Partner Violence:   . Fear  of Current or Ex-Partner: Not on file  . Emotionally Abused: Not on file  . Physically Abused: Not on file  . Sexually Abused: Not on file    Past Surgical History:  Procedure Laterality Date  . APPLICATION OF WOUND VAC Right 01/29/2020   Procedure: APPLICATION OF WOUND VAC;  Surgeon: Ronny Bacon, MD;  Location: [facility];  Service: General;  Laterality: Right;  . BREAST BIOPSY Right 11/06/2019   [place] core path pending venus clip  . HERNIA REPAIR Left   . INCISION AND DRAINAGE ABSCESS N/A 01/26/2018   Procedure: INCISION AND DEBRIDEMENT OF VULVAR NECROTIZING SOFT TISSUE INFECTION;  Surgeon: Greer Pickerel, MD;  Location: [place];  Service: General;  Laterality: N/A;  . INCISION AND DRAINAGE PERIRECTAL ABSCESS N/A 01/22/2018   Procedure: IRRIGATION AND DEBRIDEMENT LABIAL/VULVAR AREA;  Surgeon: Coralie Keens, MD;  Location: [place];  Service: General;  Laterality: N/A;  . INCISION AND DRAINAGE PERIRECTAL ABSCESS N/A 01/29/2018   Procedure: IRRIGATION AND DEBRIDEMENT VULVA;  Surgeon: Excell Seltzer, MD;  Location: [place];  Service: General;  Laterality: N/A;  . INGUINAL HERNIA REPAIR Left 04/08/2015   Procedure: OPEN LEFT INGUINAL HERNIA REPAIR WITH MESH;  Surgeon: Ralene Ok, MD;  Location: [place];  Service: General;  Laterality: Left;  . INSERTION OF MESH Left 04/08/2015   Procedure: INSERTION OF MESH;  Surgeon: Ralene Ok, MD;  Location: [place];  Service: General;  Laterality: Left;  . LAPAROSCOPY     Endometriosis   . LEFT HEART CATHETERIZATION WITH CORONARY ANGIOGRAM N/A 12/05/2013   Procedure: LEFT HEART CATHETERIZATION WITH CORONARY ANGIOGRAM;  Surgeon: Jettie Booze, MD;  Location[facility] C[facility];  Service: Cardiovascular;  Laterality: N/A;  . right kidney drained    . SIMPLE MASTECTOMY WITH AXILLARY SENTINEL NODE BIOPSY Right 12/23/2019   Procedure: Right SIMPLE MASTECTOMY WITH AXILLARY SENTINEL NODE BIOPSY;  Surgeon: Ronny Bacon, MD;  Location: [facility];  Service: General;  Laterality: Right;  . TEE WITHOUT CARDIOVERSION N/A 11/28/2013   Procedure: TRANSESOPHAGEAL ECHOCARDIOGRAM (TEE);  Surgeon: Thayer Headings, MD;  Location: [place];  Service: Cardiovascular;  Laterality: N/A;  . WOUND DEBRIDEMENT Right 01/29/2020   Procedure: DEBRIDEMENT WOUND, Excisional debridement skin, subcutaneous and muscle right chest wall;  Surgeon: Ronny Bacon, MD;  Location: [facility];  Service: General;  Laterality: Right;    Family History  Problem Relation Age of Onset  . Venous thrombosis Brother   . Other Brother        BRAIN TUMOR  . Asthma Father   . Diabetes Father   . Coronary artery disease Mother   . Hypertension Mother   . Diabetes Mother   . Breast cancer Mother 68  . Asthma Sister   . Diabetes type II Brother     Allergies  Allergen Reactions  . Adhesive [Tape] Rash and Other (See Comments)    TAKES OFF THE SKIN (CERTAIN MEDICAL TAPES DO THIS!!)  . Metoprolol Shortness Of Breath    Occurrence of shortness of breath after 3 days  . Montelukast Shortness Of Breath  . Morphine Sulfate Anaphylaxis, Shortness Of Breath and Nausea And Vomiting    Swollen Throat - Able to tolerate dilaudid  . Penicillins Anaphylaxis, Hives and Shortness Of Breath    Throat swells Has patient had a PCN reaction causing immediate rash, facial/tongue/throat swelling, SOB or lightheadedness with hypotension: Yes Has patient had a PCN reaction causing severe rash involving mucus membranes or skin  necrosis: No Has patient had a PCN  reaction that required hospitalization: Yes Has patient had a PCN reaction occurring within the last 10 years: No If all of the above answers are "NO", then may proceed with Cephalosporin use.   . Prednisone Anaphylaxis  . Diltiazem Swelling  . Gabapentin Swelling  . Midodrine     Lightheaded and falling down    Current Outpatient Medications on File Prior to Visit  Medication Sig Dispense Refill  . acetaminophen (TYLENOL) 500 MG tablet Take 1,000 mg by mouth every 6 (six) hours as needed for moderate pain.     Marland Kitchen albuterol (VENTOLIN HFA) 108 (90 Base) MCG/ACT inhaler Inhale 2 puffs into the lungs every 6 (six) hours as needed for wheezing or shortness of breath.    . ALPRAZolam (XANAX) 1 MG tablet Take 1 mg by mouth 4 (four) times daily as needed for anxiety.     Marland Kitchen aspirin EC 81 MG tablet Take 81 mg by mouth daily before breakfast.     . benzonatate (TESSALON PERLES) 100 MG capsule Take 1 capsule (100 mg total) by mouth 3 (three) times daily as needed for cough. 20 capsule 0  . budesonide-formoterol (SYMBICORT) 80-4.5 MCG/ACT inhaler INHALE 2 PUFFS BY MOUTH EVERY 12 HOURS TO PREVENT COUGH OR WHEEZING *RINSE MOUTH AFTER EACH USE* (Patient taking differently: Inhale 2 puffs into the lungs in the morning and at bedtime. ) 10.2 g 3  . busPIRone (BUSPAR) 30 MG tablet Take 30 mg by mouth 2 (two) times daily.    . citalopram (CELEXA) 20 MG tablet Take 1 tablet (20 mg total) by mouth daily. 30 tablet 2  . Dapagliflozin-metFORMIN HCl ER 5-500 MG TB24 Take 1 tablet by mouth daily with breakfast.    . desvenlafaxine (PRISTIQ) 100 MG 24 hr tablet Take 100 mg by mouth daily.    . famotidine (PEPCID) 20 MG tablet TAKE 1 TABLET (20 MG TOTAL) BY MOUTH 2 (TWO) TIMES DAILY. FOR HEARTBURN. (Patient taking differently: Take 20 mg by mouth 2 (two) times daily. ) 180 tablet 1  . fluconazole (DIFLUCAN) 100 MG tablet Take 1 tablet (100 mg total) by mouth daily. 14 tablet 0  .  guaiFENesin-codeine 100-10 MG/5ML syrup Take 5 mLs by mouth 3 (three) times daily as needed for cough. 120 mL 0  . Insulin Pen Needle 32G X 4 MM MISC Used to give insulin injections twice daily. 100 each 11  . insulin regular human CONCENTRATED (HUMULIN R) 500 UNIT/ML injection Inject 30 Units into the skin 3 (three) times daily with meals.     . Insulin Syringe-Needle U-100 (INSULIN SYRINGE 1CC/30GX1/2") 30G X 1/2" 1 ML MISC 1 each by Does not apply route 2 (two) times daily. E11.9 200 each 0  . ipratropium-albuterol (DUONEB) 0.5-2.5 (3) MG/3ML SOLN Take 3 mLs by nebulization every 4 (four) hours as needed (wheezing/shortness of breath). 360 mL 0  . metolazone (ZAROXOLYN) 5 MG tablet Take 5 mg by mouth daily.     Marland Kitchen nystatin-triamcinolone ointment (MYCOLOG) Apply 1 application topically 2 (two) times daily. 30 g 1  . ondansetron (ZOFRAN) 4 MG tablet Take 4 mg by mouth 2 (two) times daily.     Glory Rosebush Delica Lancets 65H MISC 1 each by Does not apply route 3 (three) times daily. Use to monitor glucose levels three times daily; E11.9; E10.42 100 each 11  . ONETOUCH VERIO test strip USE AS DIRECTED TO CHECK BLOOD SUGAR 3 TIMES DAILY 100 each 0  . OXYGEN Inhale 2-4 L into the lungs 2 (  two) times daily as needed (shortness of breath).     . pregabalin (LYRICA) 300 MG capsule TAKE 1 CAPSULE BY MOUTH TWICE DAILY 180 capsule 0  . rOPINIRole (REQUIP) 1 MG tablet TAKE 1 TABLET BY MOUTH EVERY NIGHT AT BEDTIME FOR RESTLESS LEGS. (Patient taking differently: Take 1 mg by mouth at bedtime. ) 90 tablet 1  . rosuvastatin (CRESTOR) 10 MG tablet Take 1 tablet (10 mg total) by mouth daily. (Patient taking differently: Take 10 mg by mouth at bedtime. ) 90 tablet 3  . spironolactone (ALDACTONE) 50 MG tablet TAKE 1 TABLET BY MOUTH ONCE A DAY 90 tablet 1  . torsemide (DEMADEX) 20 MG tablet TAKE 1 TABLET BY MOUTH 4 TIMES DAILY (Patient taking differently: Take 20 mg by mouth 2 (two) times daily. ) 360 tablet 3  .  traZODone (DESYREL) 100 MG tablet Take 200 mg by mouth at bedtime.     Marland Kitchen ULTICARE MINI PEN NEEDLES 31G X 6 MM MISC USE AS DIRECTED THREE TIMES DAILY 100 each 0  . tamoxifen (NOLVADEX) 20 MG tablet Take 1 tablet (20 mg total) by mouth daily. (Patient not taking: Reported on 05/15/2020) 90 tablet 1   No current facility-administered medications on file prior to visit.    BP 110/60   Pulse 74   Temp (!) 97.5 F (36.4 C) (Temporal)   Ht 5\' 4"  (1.626 m)   Wt 259 lb (117.5 kg)   LMP 04/20/2020   SpO2 96%   BMI 44.46 kg/m    Objective:   Physical Exam Cardiovascular:     Rate and Rhythm: Normal rate and regular rhythm.     Pulses:          Dorsalis pedis pulses are 2+ on the right side and 1+ on the left side.     Comments: Weaker/faint left dorsalis pedis pulse on exam today. Pulmonary:     Effort: Pulmonary effort is normal.     Breath sounds: Normal breath sounds. No rhonchi.  Musculoskeletal:     Comments: Mild contracture to left hand with inability to straighten fingers completely.  Even more mild evidence of this on right hand.  Evidence of mild trigger finger to left third digit.  Open wound to left medial side of second digit   Skin:         Comments: Left anterior lower extremity ulcer apparent on exam today, superficial with fat layer exposure.  Evidence of PAD to bilateral lower extremities.  Neurological:     Mental Status: She is alert and oriented to person, place, and time.  Psychiatric:        Mood and Affect: Mood normal.            Assessment & Plan:

## 2020-05-15 NOTE — Patient Instructions (Signed)
Increase your aspirin to 325 mg daily.   Make sure you are taking your rosuvastatin (Crestor) medication daily.  Stop by the lab prior to leaving today. I will notify you of your results once received.   Stop by the front desk and speak with either [name] regarding your referral to the wound clinic, for your CT scan, and for your blood flow tests.  Please call your heart doctor and tell him about your symptoms.  It was a pleasure to see you today!

## 2020-05-15 NOTE — Assessment & Plan Note (Signed)
Unclear if this is acute, this may be more chronic as she does have evidence of mild trigger finger to the left hand.  Obtain plain films today. Consider sports med/orthopedic evaluation.

## 2020-05-15 NOTE — Assessment & Plan Note (Signed)
Recent symptoms representative of CVA. Left-sided weakness apparent on exam today.  Given acute symptoms, coupled with examination, we will obtain stat CT head.  No known atrial fibrillation, she does have right internal carotid artery stenosis.  Carotid artery Dopplers completed in February 2021, last echocardiogram was in March 2020.  She follows with cardiology, she will reach out to her cardiologist regarding recommendations for repeat imaging.  Increase aspirin to 325 mg daily, continue statin therapy.  She may need clopidogrel.  Symptoms have improved compared to 2 weeks ago.  Strict emergency department precautions provided.  She is at very high risk for recurrent stroke given her medical history and tobacco abuse.

## 2020-05-17 ENCOUNTER — Emergency Department
Admission: EM | Admit: 2020-05-17 | Discharge: 2020-05-18 | Disposition: A | Discharge status: Home / Self Care | Payer: BC Managed Care – PPO | Attending: Emergency Medicine | Admitting: Emergency Medicine

## 2020-05-17 ENCOUNTER — Other Ambulatory Visit: Payer: Self-pay

## 2020-05-17 DIAGNOSIS — E1122 Type 2 diabetes mellitus with diabetic chronic kidney disease: Secondary | ICD-10-CM | POA: Diagnosis not present

## 2020-05-17 DIAGNOSIS — I4510 Unspecified right bundle-branch block: Secondary | ICD-10-CM | POA: Diagnosis not present

## 2020-05-17 DIAGNOSIS — I5032 Chronic diastolic (congestive) heart failure: Secondary | ICD-10-CM | POA: Insufficient documentation

## 2020-05-17 DIAGNOSIS — F17210 Nicotine dependence, cigarettes, uncomplicated: Secondary | ICD-10-CM | POA: Insufficient documentation

## 2020-05-17 DIAGNOSIS — Z79810 Long term (current) use of selective estrogen receptor modulators (SERMs): Secondary | ICD-10-CM | POA: Diagnosis not present

## 2020-05-17 DIAGNOSIS — J45909 Unspecified asthma, uncomplicated: Secondary | ICD-10-CM | POA: Diagnosis not present

## 2020-05-17 DIAGNOSIS — R918 Other nonspecific abnormal finding of lung field: Secondary | ICD-10-CM | POA: Diagnosis not present

## 2020-05-17 DIAGNOSIS — Z794 Long term (current) use of insulin: Secondary | ICD-10-CM | POA: Insufficient documentation

## 2020-05-17 DIAGNOSIS — I130 Hypertensive heart and chronic kidney disease with heart failure and stage 1 through stage 4 chronic kidney disease, or unspecified chronic kidney disease: Secondary | ICD-10-CM | POA: Diagnosis not present

## 2020-05-17 DIAGNOSIS — Z7982 Long term (current) use of aspirin: Secondary | ICD-10-CM | POA: Insufficient documentation

## 2020-05-17 DIAGNOSIS — N183 Chronic kidney disease, stage 3 unspecified: Secondary | ICD-10-CM | POA: Insufficient documentation

## 2020-05-17 DIAGNOSIS — R55 Syncope and collapse: Secondary | ICD-10-CM | POA: Insufficient documentation

## 2020-05-17 DIAGNOSIS — R110 Nausea: Secondary | ICD-10-CM | POA: Diagnosis not present

## 2020-05-17 DIAGNOSIS — Z79899 Other long term (current) drug therapy: Secondary | ICD-10-CM | POA: Diagnosis not present

## 2020-05-17 DIAGNOSIS — K824 Cholesterolosis of gallbladder: Secondary | ICD-10-CM | POA: Insufficient documentation

## 2020-05-17 DIAGNOSIS — I700 Atherosclerosis of aorta: Secondary | ICD-10-CM | POA: Diagnosis not present

## 2020-05-17 DIAGNOSIS — I2510 Atherosclerotic heart disease of native coronary artery without angina pectoris: Secondary | ICD-10-CM | POA: Insufficient documentation

## 2020-05-17 DIAGNOSIS — K828 Other specified diseases of gallbladder: Secondary | ICD-10-CM

## 2020-05-17 DIAGNOSIS — C50511 Malignant neoplasm of lower-outer quadrant of right female breast: Secondary | ICD-10-CM | POA: Diagnosis not present

## 2020-05-17 DIAGNOSIS — Z170 Estrogen receptor positive status [ER+]: Secondary | ICD-10-CM | POA: Diagnosis not present

## 2020-05-17 DIAGNOSIS — K829 Disease of gallbladder, unspecified: Secondary | ICD-10-CM | POA: Diagnosis not present

## 2020-05-17 DIAGNOSIS — G4489 Other headache syndrome: Secondary | ICD-10-CM | POA: Diagnosis not present

## 2020-05-17 DIAGNOSIS — E1165 Type 2 diabetes mellitus with hyperglycemia: Secondary | ICD-10-CM | POA: Diagnosis not present

## 2020-05-17 DIAGNOSIS — R197 Diarrhea, unspecified: Secondary | ICD-10-CM | POA: Diagnosis not present

## 2020-05-17 DIAGNOSIS — R4020 Unspecified coma: Secondary | ICD-10-CM | POA: Diagnosis not present

## 2020-05-17 LAB — CBC WITH DIFFERENTIAL/PLATELET

## 2020-05-17 LAB — BASIC METABOLIC PANEL
Anion gap: 10 (ref 5–15)
BUN: 21 mg/dL — ABNORMAL HIGH (ref 6–20)
CO2: 22 mmol/L (ref 22–32)
Calcium: 8.5 mg/dL — ABNORMAL LOW (ref 8.9–10.3)
Chloride: 100 mmol/L (ref 98–111)
Creatinine, Ser: 0.96 mg/dL (ref 0.44–1.00)
GFR, Estimated: >60 mL/min (ref >60)
Glucose, Bld: 422 mg/dL — ABNORMAL HIGH (ref 70–99)
Potassium: 3.8 mmol/L (ref 3.5–5.1)
Sodium: 132 mmol/L — ABNORMAL LOW (ref 135–145)

## 2020-05-17 NOTE — ED Triage Notes (Addendum)
Per EMS, patients husband states that patient has syncopal episode lasting 5 minutes while lying in bed after using the restroom. Hx TIA on Friday. Alert and oriented x4 at this time, no deficits other than unsteady gait. Pt with sluggish speech but that is not new per husband. FSBS 487 with EMS, denies using night time insulin and high sugar diet. Pt endorses taking all of her nightly medications including trazadone, buspar, and xanax prior to syncope.

## 2020-05-18 ENCOUNTER — Emergency Department: Payer: BC Managed Care – PPO

## 2020-05-18 ENCOUNTER — Other Ambulatory Visit: Payer: Self-pay

## 2020-05-18 ENCOUNTER — Other Ambulatory Visit: Payer: Self-pay | Admitting: Primary Care

## 2020-05-18 ENCOUNTER — Encounter: Payer: Self-pay | Admitting: Radiology

## 2020-05-18 ENCOUNTER — Telehealth: Payer: Self-pay

## 2020-05-18 ENCOUNTER — Other Ambulatory Visit: Payer: Self-pay | Admitting: Endocrinology

## 2020-05-18 DIAGNOSIS — R918 Other nonspecific abnormal finding of lung field: Secondary | ICD-10-CM | POA: Diagnosis not present

## 2020-05-18 DIAGNOSIS — I739 Peripheral vascular disease, unspecified: Secondary | ICD-10-CM

## 2020-05-18 DIAGNOSIS — I700 Atherosclerosis of aorta: Secondary | ICD-10-CM | POA: Diagnosis not present

## 2020-05-18 DIAGNOSIS — E1165 Type 2 diabetes mellitus with hyperglycemia: Secondary | ICD-10-CM

## 2020-05-18 DIAGNOSIS — J449 Chronic obstructive pulmonary disease, unspecified: Secondary | ICD-10-CM | POA: Diagnosis not present

## 2020-05-18 DIAGNOSIS — K829 Disease of gallbladder, unspecified: Secondary | ICD-10-CM | POA: Diagnosis not present

## 2020-05-18 DIAGNOSIS — T148XXA Other injury of unspecified body region, initial encounter: Secondary | ICD-10-CM

## 2020-05-18 LAB — HEPATIC FUNCTION PANEL
ALT: 21 U/L (ref 0–44)
AST: 21 U/L (ref 15–41)
Albumin: 3.3 g/dL — ABNORMAL LOW (ref 3.5–5.0)
Alkaline Phosphatase: 130 U/L — ABNORMAL HIGH (ref 38–126)
Bilirubin, Direct: 0.2 mg/dL (ref 0.0–0.2)
Indirect Bilirubin: 0.8 mg/dL (ref 0.3–0.9)
Total Bilirubin: 1 mg/dL (ref 0.3–1.2)
Total Protein: 8.2 g/dL — ABNORMAL HIGH (ref 6.5–8.1)

## 2020-05-18 LAB — CBC WITH DIFFERENTIAL/PLATELET
Abs Immature Granulocytes: 0.08 10*3/uL — ABNORMAL HIGH (ref 0.00–0.07)
Basophils Absolute: 0.1 10*3/uL (ref 0.0–0.1)
Basophils Relative: 1 %
Eosinophils Absolute: 0.2 10*3/uL (ref 0.0–0.5)
Eosinophils Relative: 3 %
HCT: 39.2 % (ref 36.0–46.0)
Hemoglobin: 12.4 g/dL (ref 12.0–15.0)
Immature Granulocytes: 1 %
Lymphocytes Relative: 13 %
Lymphs Abs: 1.1 10*3/uL (ref 0.7–4.0)
MCH: 23.8 pg — ABNORMAL LOW (ref 26.0–34.0)
MCHC: 31.6 g/dL (ref 30.0–36.0)
MCV: 75.2 fL — ABNORMAL LOW (ref 80.0–100.0)
Monocytes Absolute: 0.6 10*3/uL (ref 0.1–1.0)
Monocytes Relative: 7 %
Neutro Abs: 6.6 10*3/uL (ref 1.7–7.7)
Neutrophils Relative %: 75 %
Platelets: 176 10*3/uL (ref 150–400)
RBC: 5.21 MIL/uL — ABNORMAL HIGH (ref 3.87–5.11)
RDW: 21.7 % — ABNORMAL HIGH (ref 11.5–15.5)
Smear Review: NORMAL
WBC: 8.6 10*3/uL (ref 4.0–10.5)
nRBC: 0 % (ref 0.0–0.2)

## 2020-05-18 LAB — LIPASE, BLOOD: Lipase: 35 U/L (ref 11–51)

## 2020-05-18 LAB — TROPONIN I (HIGH SENSITIVITY): Troponin I (High Sensitivity): 9 ng/L (ref <18)

## 2020-05-18 LAB — FIBRIN DERIVATIVES D-DIMER (ARMC ONLY): Fibrin derivatives D-dimer (ARMC): 1287.63 ng/mL (FEU) — ABNORMAL HIGH (ref 0.00–499.00)

## 2020-05-18 MED ORDER — ROSUVASTATIN CALCIUM 20 MG PO TABS: 20.0000 mg | ORAL_TABLET | Freq: Every day | ORAL | 0 refills | Status: DC

## 2020-05-18 MED ORDER — IOHEXOL 350 MG/ML SOLN
100.0000 mL | Freq: Once | INTRAVENOUS | Status: AC | PRN
  Administered 2020-05-18: 100 mL via INTRAVENOUS

## 2020-05-18 NOTE — Telephone Encounter (Signed)
Called patient reviewed all information and repeated back to me. Will call if any questions.  ? ?

## 2020-05-18 NOTE — ED Notes (Signed)
Patient transported to Ultrasound 

## 2020-05-18 NOTE — Telephone Encounter (Signed)
Pt called for results from hand xray; pt notified as instructed. Pt said was passed out last night between 10:30 - 11:00 PM while getting ready for bed; pt said nothing was different than usual except pt felt cold; no H/A, dizziness, CP or SOB. Pt went to ED where [place] of abd,and CT of chest was done; along with labs. Pt said she still has all fingers on lt hand are curved fingers and the pinky and ring finger on rt hand are curved in; pt has same weakness on lt arms and legs as when seen by Gentry Fitz NP on 05/15/20. Pt still has temple H/A at pain level 6. Gentry Fitz NP notified of this info and said to schedule pt on 05/20/20 at end of session; pt scheduled HFU on 05/24/20 at 3 PM. Pt also wants to know since cannot take ibuprofen for pain pt wants to know what she can take for H/A. Pt has Tylenol arthritis strength at home. FYI (on 05/17/20 D Dimer was 1287.63 but CT of chest done also). Sending note to Gentry Fitz NP.

## 2020-05-18 NOTE — Discharge Instructions (Signed)
You have been seen today in the Emergency Department (ED)  for syncope (passing out).  Your workup including labs and EKG show reassuring results, and you strongly prefer to go home, so we will discharge you tonight.  Please call your regular doctor as soon as possible to schedule the next available clinic appointment to follow up with him/her regarding your visit to the ED and your symptoms.  Return to the Emergency Department (ED)  if you have any further syncopal episodes (pass out again) or develop ANY chest pain, pressure, tightness, trouble breathing, sudden sweating, or other symptoms that concern you. As we discussed, your gallbladder wall appears slightly thickened as well, so you may want to schedule a follow-up appointment with Dr. Christian Mate to discuss this finding and whether or not you need additional evaluation.

## 2020-05-18 NOTE — ED Notes (Signed)
Patient transported to CT 
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
## 2020-05-18 NOTE — ED Notes (Signed)
Patient verbalizes understanding of discharge instructions. Opportunity for questioning and answers were provided. Armband removed by staff, pt discharged from ED. Wheeled out to lobby with spouse

## 2020-05-18 NOTE — Telephone Encounter (Signed)
Noted, will evaluate. Emergency department notes reviewed.  Okay to take extra Tylenol, 500 to 1000 mg 3 times daily as needed for pain. Do not exceed 3000 mg in 24 hours.

## 2020-05-18 NOTE — ED Notes (Signed)
Pt's sats dropping to mid-upper 80s intermittently; pt reports she wears 2L [place] at home at night. [place] placed @ 2L

## 2020-05-19 ENCOUNTER — Other Ambulatory Visit: Payer: Self-pay | Admitting: *Deleted

## 2020-05-20 ENCOUNTER — Ambulatory Visit (INDEPENDENT_AMBULATORY_CARE_PROVIDER_SITE_OTHER): Payer: BC Managed Care – PPO | Admitting: Primary Care

## 2020-05-20 ENCOUNTER — Other Ambulatory Visit: Payer: Self-pay | Admitting: *Deleted

## 2020-05-20 ENCOUNTER — Other Ambulatory Visit: Payer: Self-pay

## 2020-05-20 ENCOUNTER — Ambulatory Visit (INDEPENDENT_AMBULATORY_CARE_PROVIDER_SITE_OTHER)
Admission: RE | Admit: 2020-05-20 | Discharge: 2020-05-20 | Disposition: A | Discharge status: Home / Self Care | Payer: BC Managed Care – PPO | Source: Ambulatory Visit | Attending: Primary Care | Admitting: Primary Care

## 2020-05-20 VITALS — BP 120/50 | HR 89 | Temp 98.0°F | Ht 64.0 in | Wt 274.2 lb

## 2020-05-20 DIAGNOSIS — R531 Weakness: Secondary | ICD-10-CM

## 2020-05-20 DIAGNOSIS — I739 Peripheral vascular disease, unspecified: Secondary | ICD-10-CM

## 2020-05-20 DIAGNOSIS — R55 Syncope and collapse: Secondary | ICD-10-CM

## 2020-05-20 DIAGNOSIS — R059 Cough, unspecified: Secondary | ICD-10-CM | POA: Diagnosis not present

## 2020-05-20 DIAGNOSIS — E1165 Type 2 diabetes mellitus with hyperglycemia: Secondary | ICD-10-CM

## 2020-05-20 DIAGNOSIS — M24542 Contracture, left hand: Secondary | ICD-10-CM

## 2020-05-20 MED ORDER — "INSULIN SYRINGE 30G X 1/2"" 1 ML MISC": 1.0000 | Freq: Two times a day (BID) | 0 refills | Status: DC

## 2020-05-20 NOTE — Assessment & Plan Note (Signed)
Obvious trigger finger on exam today.  She has noticed improvement with the squeeze ball exercises at home, continue same.  Discussed referral to orthopedics or sports med for evaluation.  She will update.

## 2020-05-20 NOTE — Assessment & Plan Note (Signed)
ABIs performed last week which revealed mild decreased flow bilaterally.  She has an appointment with vascular services scheduled for later this month.  Continue hospice wound nurse evaluation.

## 2020-05-20 NOTE — Assessment & Plan Note (Signed)
Unclear etiology. Work-up in the ED grossly unremarkable.  Elevated D-dimer is concerning, but CTA chest negative for PE.  She appears well today.  Discussed to return to the hospital if syncope recurs.  Discussed proper hydration, slowly rising from seated positions.  Hospital labs, imaging, notes reviewed.

## 2020-05-20 NOTE — Patient Instructions (Signed)
Complete xray(s) prior to leaving today. I will notify you of your results once received.  Be sure to drink plenty of water to stay hydrated.  Please notify me if you experience those symptoms again.  It was a pleasure to see you today!
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
## 2020-05-21 ENCOUNTER — Telehealth: Payer: Self-pay | Admitting: Primary Care

## 2020-05-21 NOTE — Telephone Encounter (Signed)
I will send a result note once reviewed. Please remind her that I am often unable to look at results until either lunchtime or at the end of the day as I am seeing patients.  We will not forget about her results, I promise.

## 2020-05-21 NOTE — Telephone Encounter (Signed)
Called and let patient her know we will call with results.

## 2020-05-21 NOTE — Telephone Encounter (Signed)
Let me know when results are reviewed I will call patient.

## 2020-05-21 NOTE — Telephone Encounter (Signed)
Pt called in she wanted to know about the results of he x-ray.   Call back number [phone]

## 2020-05-25 ENCOUNTER — Ambulatory Visit: Payer: Self-pay | Admitting: Podiatry

## 2020-05-25 ENCOUNTER — Ambulatory Visit: Payer: BC Managed Care – PPO | Admitting: Podiatry

## 2020-06-02 ENCOUNTER — Ambulatory Visit: Payer: BC Managed Care – PPO | Admitting: Physician Assistant

## 2020-06-07 DIAGNOSIS — J9610 Chronic respiratory failure, unspecified whether with hypoxia or hypercapnia: Secondary | ICD-10-CM | POA: Diagnosis not present

## 2020-06-08 ENCOUNTER — Encounter (INDEPENDENT_AMBULATORY_CARE_PROVIDER_SITE_OTHER): Payer: BC Managed Care – PPO | Admitting: Vascular Surgery

## 2020-06-08 ENCOUNTER — Other Ambulatory Visit: Payer: Self-pay | Admitting: Primary Care

## 2020-06-08 DIAGNOSIS — G2581 Restless legs syndrome: Secondary | ICD-10-CM

## 2020-06-09 ENCOUNTER — Ambulatory Visit: Payer: BC Managed Care – PPO | Admitting: Physician Assistant

## 2020-06-10 ENCOUNTER — Telehealth: Payer: Self-pay | Admitting: *Deleted

## 2020-06-10 NOTE — Telephone Encounter (Signed)
Anna nurse with [facility] called stating that she had just gotten off the phone with the patient. [name] stated that patient is complaining of having diarrhea for about a week. [name] stated that patient has been taking Imodium and Pepto. [name] stated that the patient told her that the diarrhea is coming from her radiation pill. [name] wants to know if Allie Bossier NP can recommend anything else for the diarrhea. [name] stated that patient had been referred to someone about her neuropathy in her feet. [name] stated that patient missed that appointment yesterday and wants to call them to reschedule. [name] stated that they need the telephone number for patient to reschedule the appointment.

## 2020-06-10 NOTE — Telephone Encounter (Signed)
Left message to return call to our office.  

## 2020-06-10 NOTE — Telephone Encounter (Signed)
I recommend that [name] get in touch with her oncologist if the patient feels that her diarrhea is a side effect of her cancer treatment.   She is missing a lot of very important appointments...  I don't have their numbers but she needs to see Dr. Joaquim Lai III with the wound care center and Dr. Delana Meyer with [facility].

## 2020-06-11 ENCOUNTER — Encounter: Payer: Self-pay | Admitting: Podiatry

## 2020-06-11 ENCOUNTER — Ambulatory Visit (INDEPENDENT_AMBULATORY_CARE_PROVIDER_SITE_OTHER): Payer: Medicare HMO | Admitting: Podiatry

## 2020-06-11 ENCOUNTER — Other Ambulatory Visit: Payer: Self-pay

## 2020-06-11 ENCOUNTER — Telehealth: Payer: Self-pay | Admitting: *Deleted

## 2020-06-11 DIAGNOSIS — B351 Tinea unguium: Secondary | ICD-10-CM | POA: Diagnosis not present

## 2020-06-11 DIAGNOSIS — E1142 Type 2 diabetes mellitus with diabetic polyneuropathy: Secondary | ICD-10-CM | POA: Diagnosis not present

## 2020-06-11 DIAGNOSIS — M79675 Pain in left toe(s): Secondary | ICD-10-CM | POA: Diagnosis not present

## 2020-06-11 DIAGNOSIS — M79674 Pain in right toe(s): Secondary | ICD-10-CM | POA: Diagnosis not present

## 2020-06-11 DIAGNOSIS — E1159 Type 2 diabetes mellitus with other circulatory complications: Secondary | ICD-10-CM

## 2020-06-11 DIAGNOSIS — M2010 Hallux valgus (acquired), unspecified foot: Secondary | ICD-10-CM | POA: Insufficient documentation

## 2020-06-11 HISTORY — DX: Hallux valgus (acquired), unspecified foot: M20.10

## 2020-06-11 NOTE — Telephone Encounter (Signed)
Per Dr. Jacinto Reap, patient is only on Tamoxifen, which should not be contributing to her diarrhea symptoms. Please find out if she is actually taking it. If so, she can hold the tablet and be evaluated in [facility] if symptoms are not resolved.

## 2020-06-11 NOTE — Telephone Encounter (Signed)
I called pt and told her to hold the Tamoxifen until her Next MD appt with Dr B on Dec 20th. I told pt that it is not a usual SE of that medication but she could certainly be that rare person to experience diarrhea. Instructed her to call us back if she experiences any further diarrhea. Pt in agreement with the plan.

## 2020-06-11 NOTE — Telephone Encounter (Signed)
Spoke to [name] given all information she will reach out to oncology as well as work with patient to get appointments set up

## 2020-06-11 NOTE — Telephone Encounter (Signed)
[name] with [facility] called reporting that patient is continuing to have diarrhea and that the Imodium and Pepto sre not helping. She reports that patient did not take her "cancer pill" yesterady and that she had no diarrhea and has determined that her diarrhea is being caused by her cancer pill. [name] is asking what can be done for patient. Please advise

## 2020-06-11 NOTE — Progress Notes (Signed)
This patient returns to my office for at risk foot care.  This patient requires this care by a professional since this patient will be at risk due to having  PAD CKD and diabetes.    This patient is unable to cut nails herself since the patient cannot reach her nails.These nails are painful walking and wearing shoes. She presents to the office with her son to help balance her during gait. This patient presents for at risk foot care today. Patient is also interested in diabetic shoes.  General Appearance  Alert, conversant and in no acute stress.  Vascular  Dorsalis pedis and posterior tibial  pulses are weakly  palpable  bilaterally.  Capillary return is within normal limits  bilaterally. Temperature is within normal limits  bilaterally.  Neurologic  Senn-Weinstein monofilament wire test absent   bilaterally. Muscle power within normal limits bilaterally.  Nails Thick disfigured discolored nails with subungual debris  from hallux to fifth toes bilaterally. No evidence of bacterial infection or drainage bilaterally.  Orthopedic  No limitations of motion  feet .  No crepitus or effusions noted.  No bony pathology or digital deformities noted.  HAV  B/L.  Skin  normotropic skin with no porokeratosis noted bilaterally.  No signs of infections or ulcers noted.     Onychomycosis  Pain in right toes  Pain in left toes  Consent was obtained for treatment procedures.   Mechanical debridement of nails 1-5  bilaterally performed with a nail nipper.  Filed with dremel without incident.  Patient qualifies for diabetic shoes due to DPN and HAV  B/L.  Patient to return for diabetic shoes next week.   Return office visit    3 months                  Told patient to return for periodic foot care and evaluation due to potential at risk complications.   Gardiner Barefoot DPM

## 2020-06-17 ENCOUNTER — Other Ambulatory Visit: Payer: BC Managed Care – PPO | Admitting: Orthotics

## 2020-06-17 DIAGNOSIS — J449 Chronic obstructive pulmonary disease, unspecified: Secondary | ICD-10-CM | POA: Diagnosis not present

## 2020-06-22 ENCOUNTER — Other Ambulatory Visit: Payer: Self-pay

## 2020-06-22 ENCOUNTER — Other Ambulatory Visit: Payer: Self-pay | Admitting: Primary Care

## 2020-06-22 DIAGNOSIS — M792 Neuralgia and neuritis, unspecified: Secondary | ICD-10-CM

## 2020-06-22 DIAGNOSIS — G629 Polyneuropathy, unspecified: Secondary | ICD-10-CM

## 2020-06-22 DIAGNOSIS — E1042 Type 1 diabetes mellitus with diabetic polyneuropathy: Secondary | ICD-10-CM

## 2020-06-22 IMAGING — CT CT ANGIO CHEST
4 of 7 series · 18 of 46 positions shown · IV contrast (omnipaque)
Comparison: Chest x-ray 09/23/2019 01/06/2006

CLINICAL DATA: Chest pain hyperglycemia hypertension

EXAM:
CT ANGIOGRAPHY CHEST WITH CONTRAST
TECHNIQUE: Multidetector CT imaging of the chest was performed using the
standard protocol during bolus administration of intravenous
contrast. Multiplanar CT image reconstructions and MIPs were
obtained to evaluate the vascular anatomy.
CONTRAST:  75mL OMNIPAQUE IOHEXOL 350 MG/ML SOLN

[Series 3: axial pre · axial · non-contrast · 0.74mm/px · z∈[-476,-296]mm · 4 of 62 slices shown]
[im 13/62  lung]
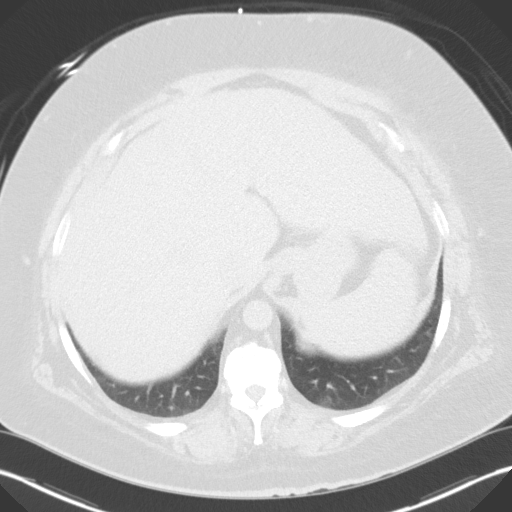
[im 25/62  lung]
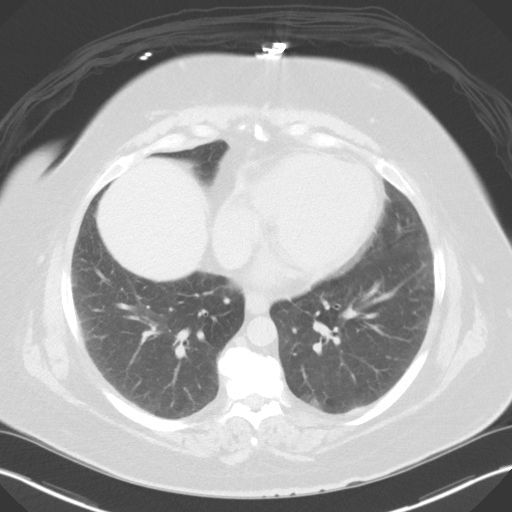
[im 37/62  lung]
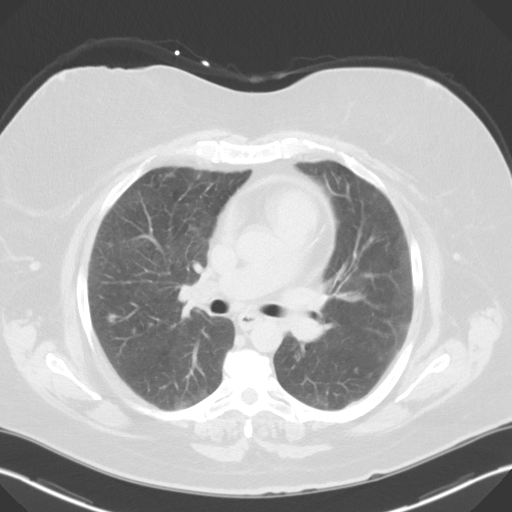
[im 49/62  lung]
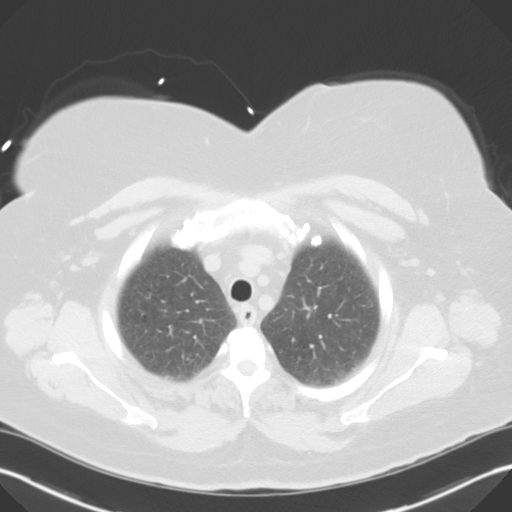

[Series 5: axial arterial · axial · arterial · 0.74mm/px · z∈[-507,-267]mm · 8 of 104 slices shown]
[im 12/104  lung]
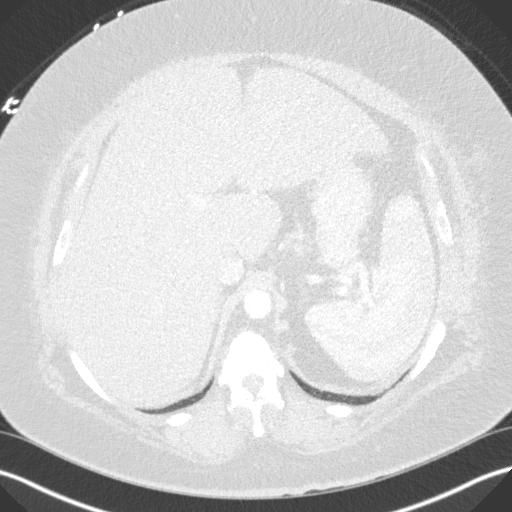
[im 23/104  soft-tissue]
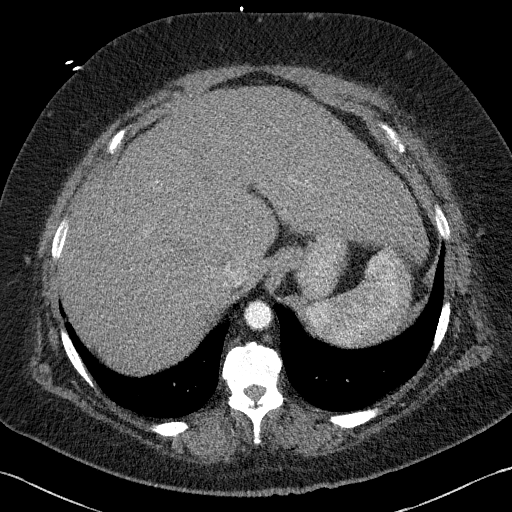
[im 35/104  lung]
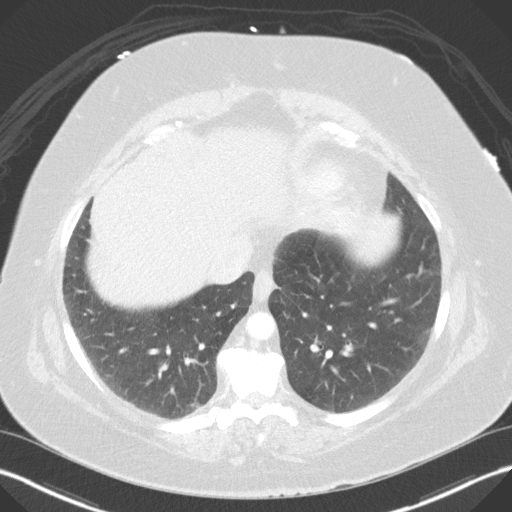
[im 46/104  soft-tissue]
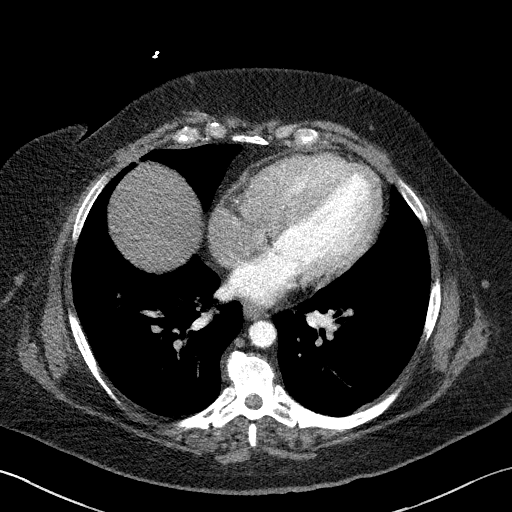
[im 58/104  lung]
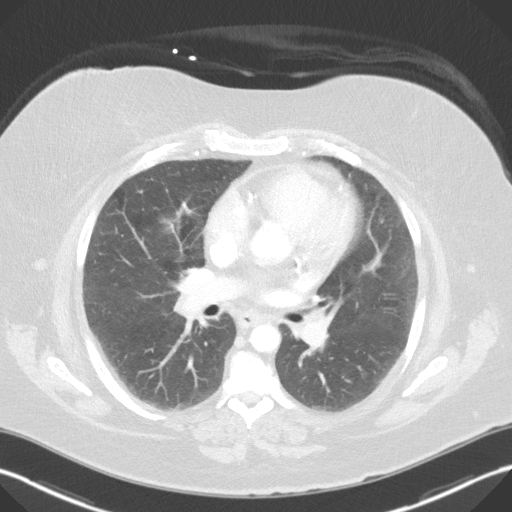
[im 69/104  soft-tissue]
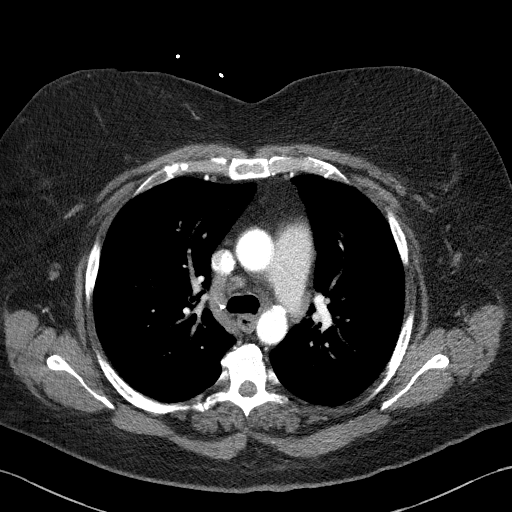
[im 81/104  lung]
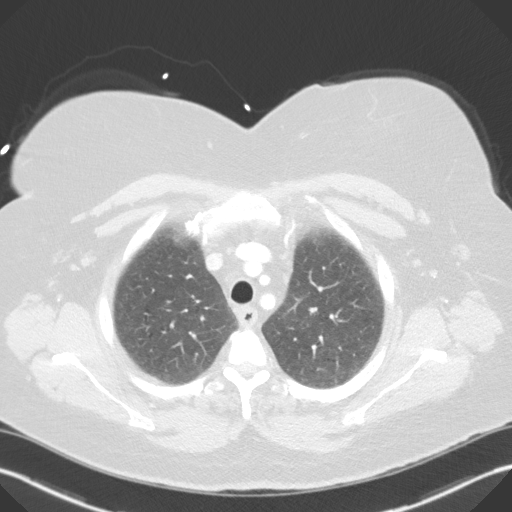
[im 92/104  soft-tissue]
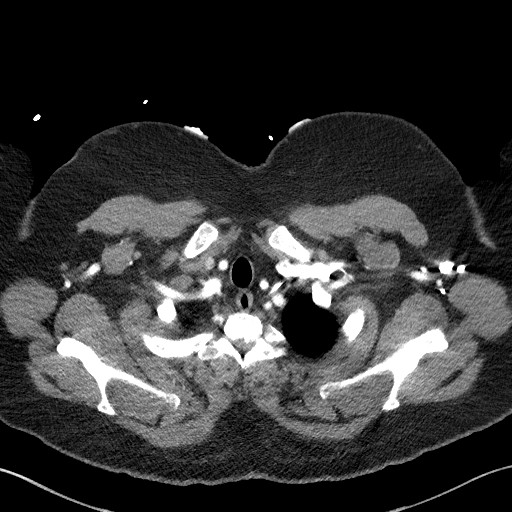

[Series 6: lung · axial · 0.74mm/px · z∈[-521,-439]mm · 3 of 156 slices shown]
[im 11/156  soft-tissue]
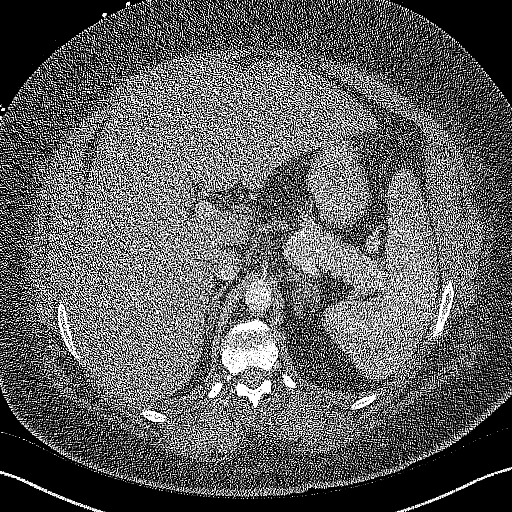
[im 32/156  soft-tissue]
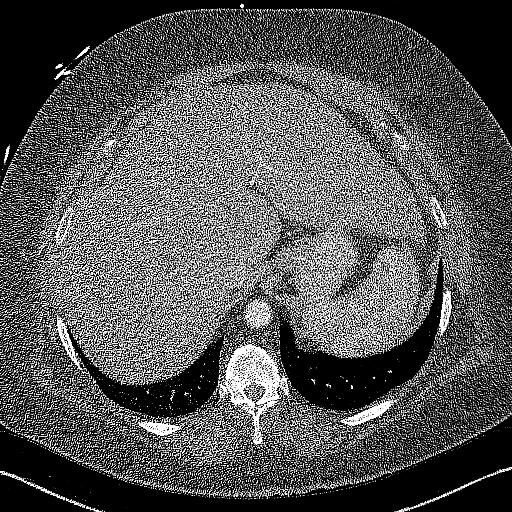
[im 52/156  soft-tissue]
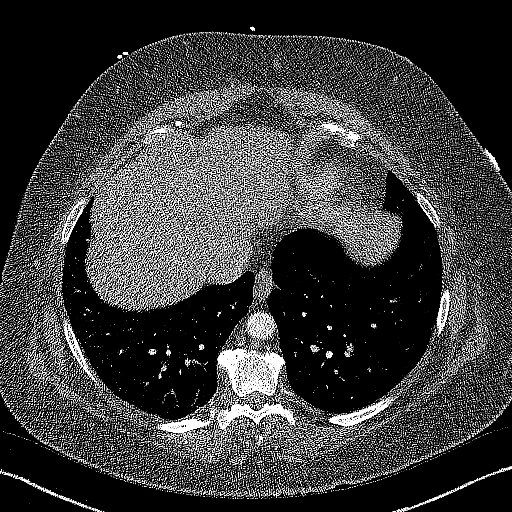

[Series 8: coronals · coronal · 0.64mm/px · 3 of 157 slices shown]
[im 40/157  soft-tissue]
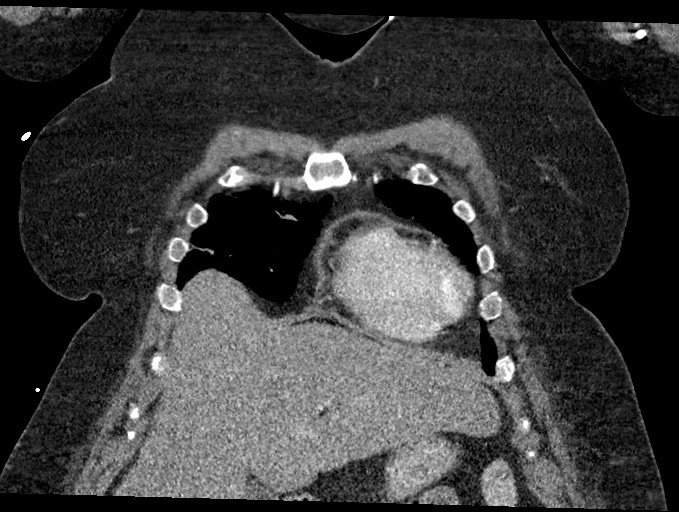
[im 79/157  soft-tissue]
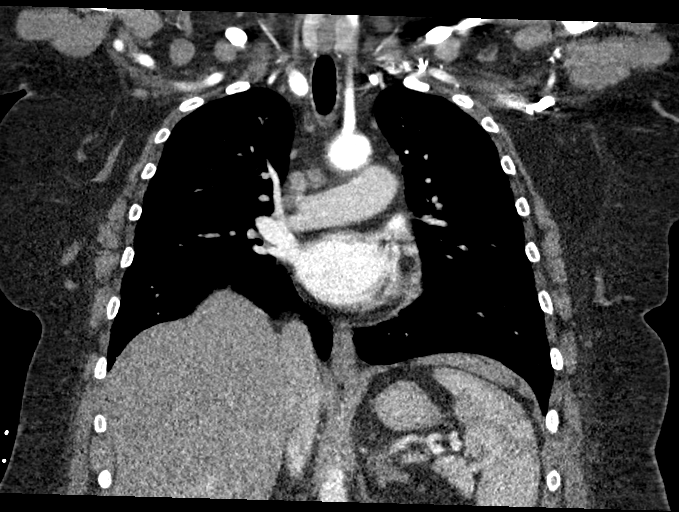
[im 118/157  soft-tissue]
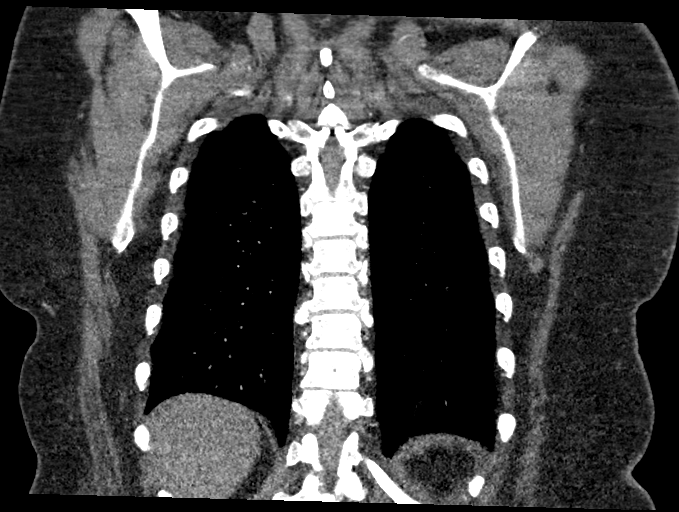

[18 of 46 positions shown; findings below may reference images not displayed]

FINDINGS: Cardiovascular: Non contrasted images of the chest demonstrate no
acute intramural hematoma. Aorta is nonaneurysmal. No dissection is
seen. Coronary vascular calcifications. Cardiac size within normal
limits. Trace pericardial effusion or thickening.

Mediastinum/Nodes: Midline trachea. Subcentimeter thyroid nodules,
no further workup recommended based on lesion size and patient age.
No suspicious adenopathy. Esophagus within normal limits.

Lungs/Pleura: No consolidation, pleural effusion or pneumothorax.
Mild apical emphysema. Scattered pulmonary nodules, with the largest
visualized in the subpleural right lower lobe measuring up to 9 mm
in size, series 6, image number 98 though stable as compared with
8551 chest CT.

Upper Abdomen: No acute abnormality.

Musculoskeletal: No chest wall abnormality. No acute or significant
osseous findings.

Incidental note is made of a 2.3 cm mass in the lower right breast.

Review of the MIP images confirms the above findings.
IMPRESSION: 1. Negative for acute aortic dissection or aneurysm.
2. 2.3 cm mass within the lower right breast. Recommend correlation
with outpatient mammography and ultrasound
3. Mild apical emphysema
4. Scattered pulmonary nodules, stable since 8551 and therefore felt
benign

Aortic Atherosclerosis ([id number]) and Emphysema ([id number]).

## 2020-06-22 IMAGING — CT CT HEAD W/O CM
3 series · 15 of 46 positions shown, 18 images · non-contrast
Comparison: CT 03/21/2019

CLINICAL DATA: Headache chest pain hyperglycemia

EXAM:
CT HEAD WITHOUT CONTRAST
TECHNIQUE: Contiguous axial images were obtained from the base of the skull
through the vertex without intravenous contrast.

[Series 2: head wo · axial · 0.41mm/px · z∈[-120,-0]mm · 9 of 29 slices shown, 12 images]
[im 3/29  brain]
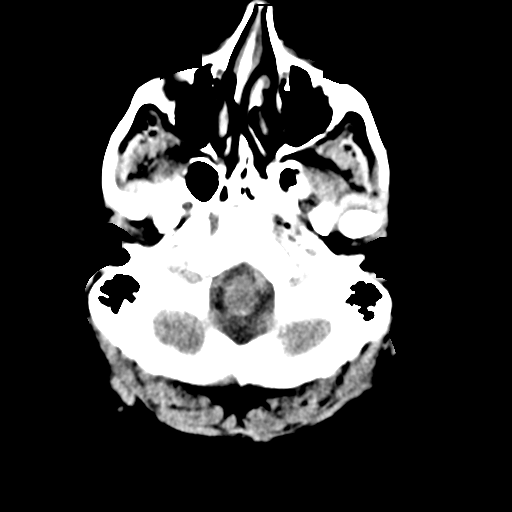
[im 3/29  bone]
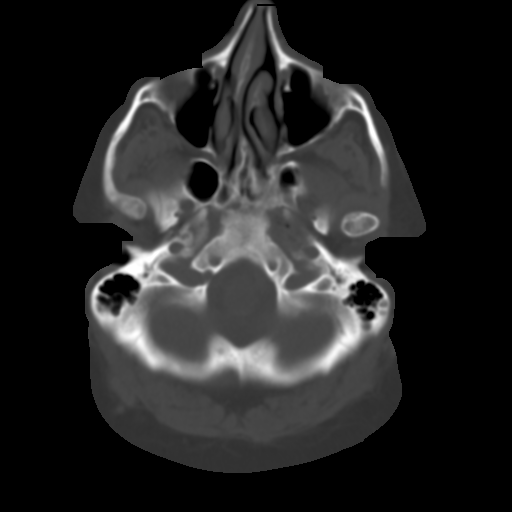
[im 6/29  brain]
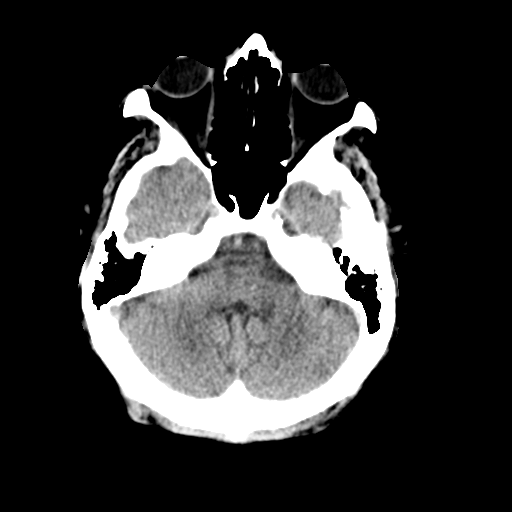
[im 9/29  brain]
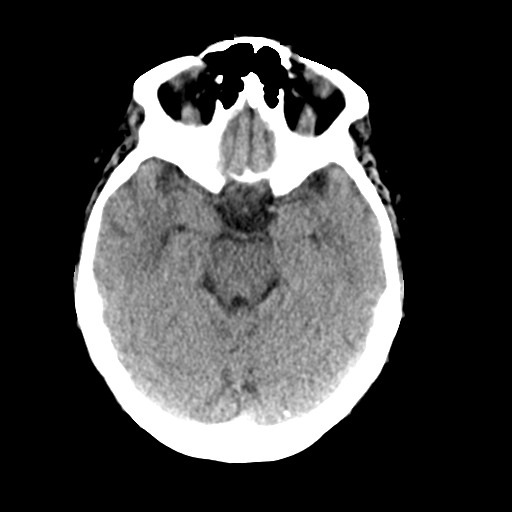
[im 12/29  brain]
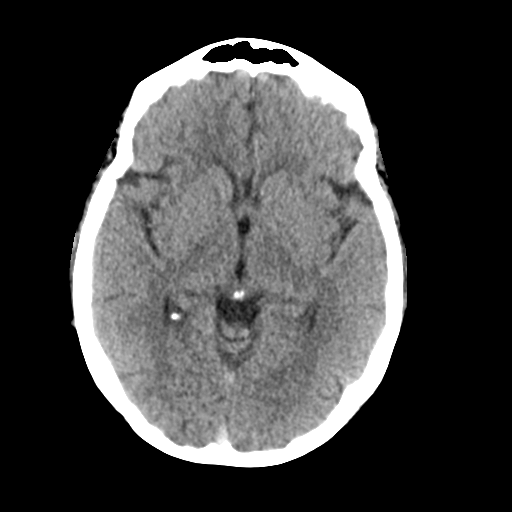
[im 15/29  brain]
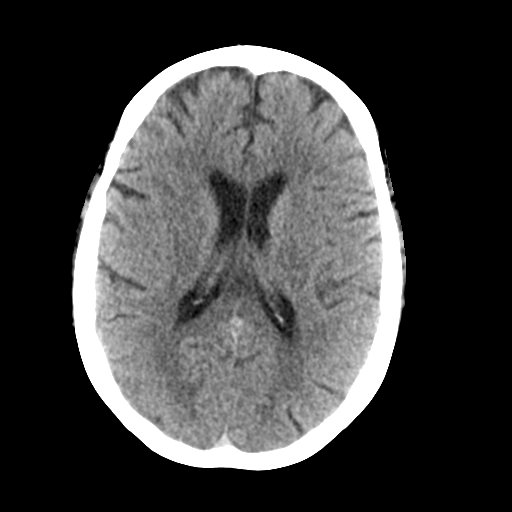
[im 15/29  bone]
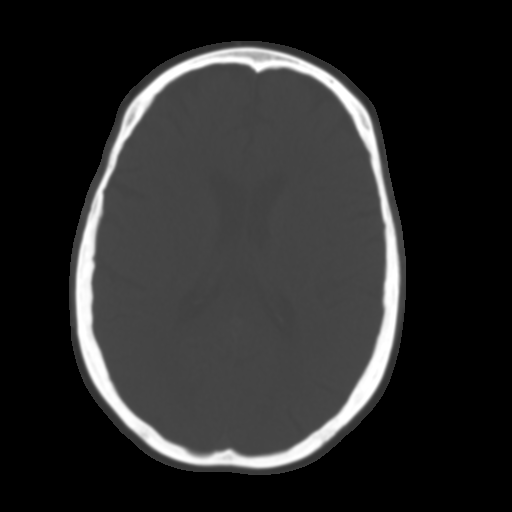
[im 18/29  brain]
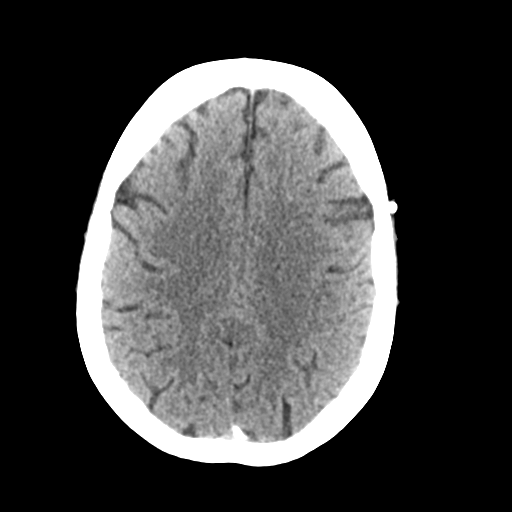
[im 21/29  brain]
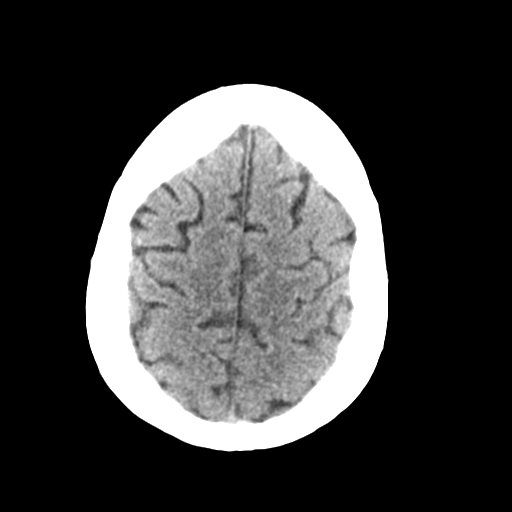
[im 24/29  brain]
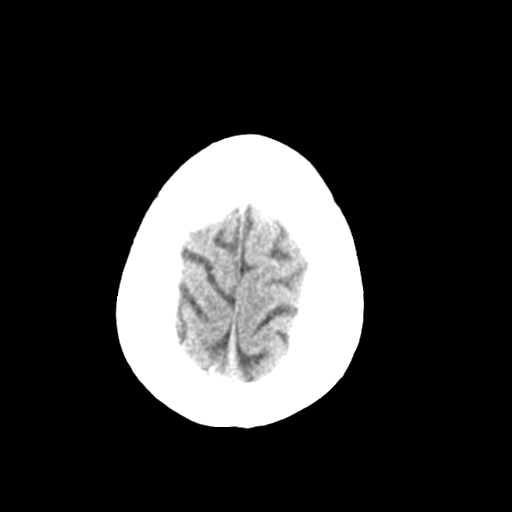
[im 27/29  brain]
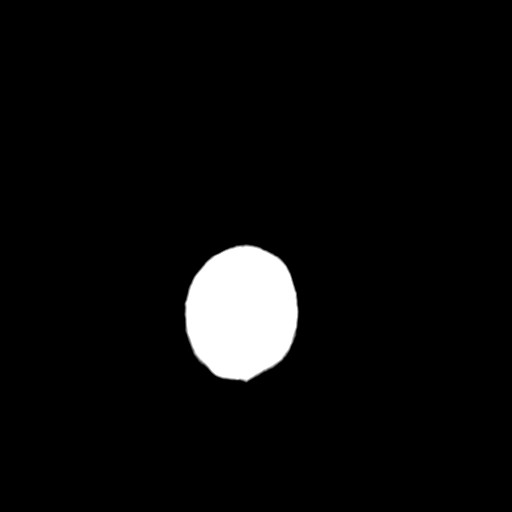
[im 27/29  bone]
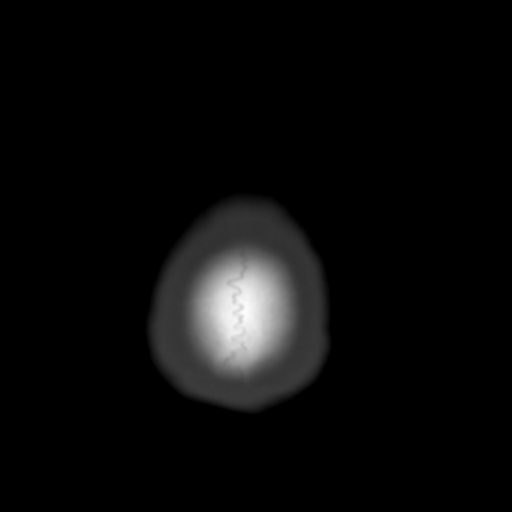

[Series 4: coronal soft tissue · coronal · 0.28mm/px · 3 of 62 slices shown]
[im 21/62  brain]
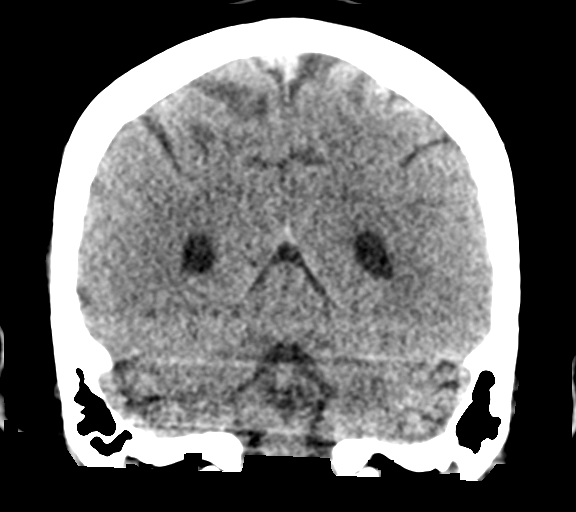
[im 28/62  brain]
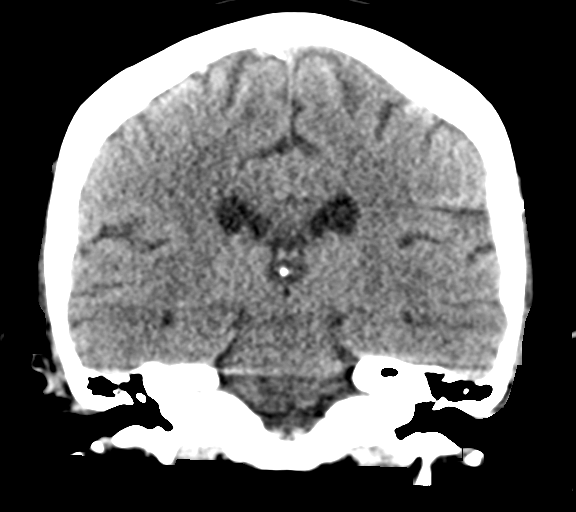
[im 34/62  brain]
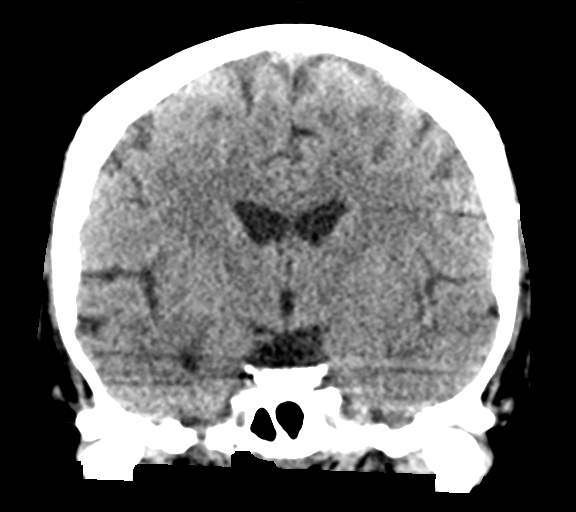

[Series 5: sagittal soft tissue · sagittal · 0.29mm/px · 3 of 49 slices shown]
[im 17/49  brain]
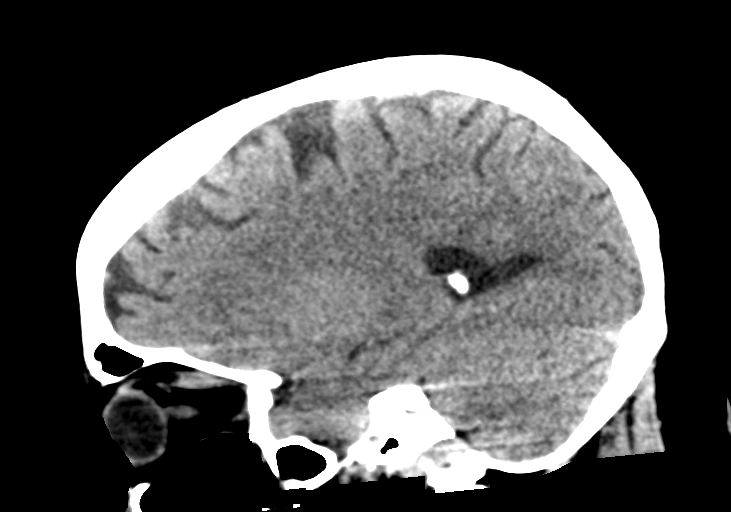
[im 25/49  brain]
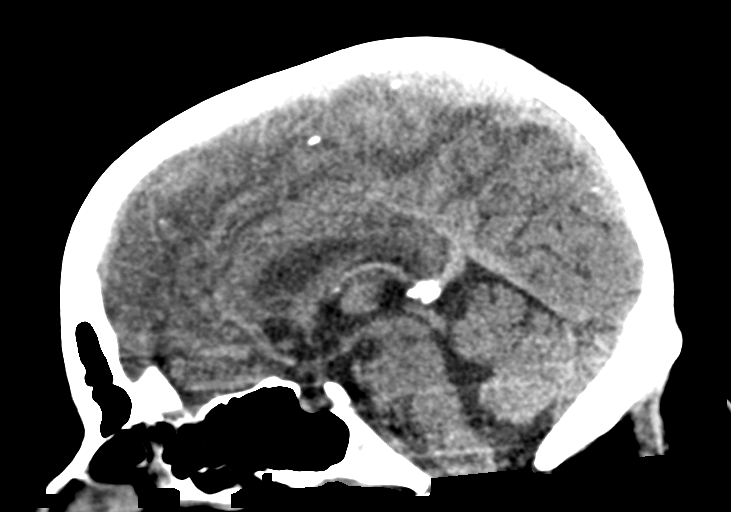
[im 33/49  brain]
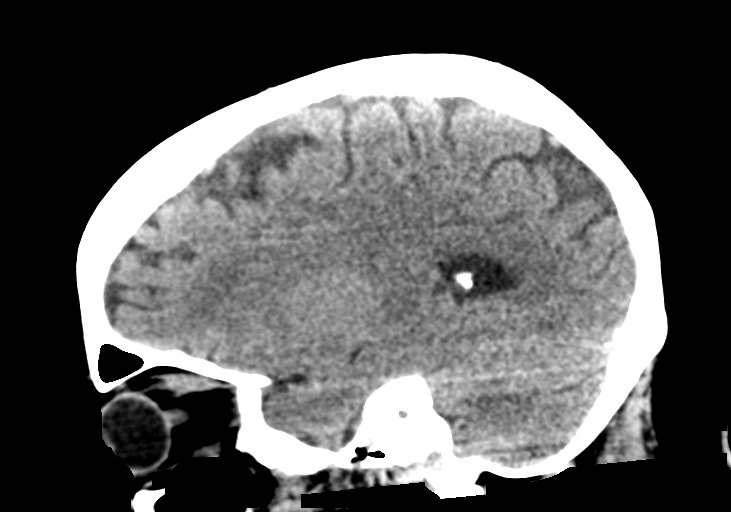

[15 of 46 positions shown; findings below may reference images not displayed]

FINDINGS: Brain: No acute territorial infarction, hemorrhage, or intracranial
mass. The ventricles are nonenlarged.

Vascular: No hyperdense vessels. Scattered calcifications at the
carotid siphon

Skull: Normal. Negative for fracture or focal lesion.

Sinuses/Orbits: No acute finding.

Other: Metallic BB in the left anterior scalp
IMPRESSION: Negative non contrasted CT appearance of the brain

## 2020-06-22 MED ORDER — PREGABALIN 300 MG PO CAPS: 300.0000 mg | ORAL_CAPSULE | Freq: Two times a day (BID) | ORAL | 0 refills | Status: DC

## 2020-06-24 ENCOUNTER — Telehealth: Payer: Self-pay

## 2020-06-24 NOTE — Telephone Encounter (Signed)
Alice, can you move her to the 10:40 am slot for 12/17?

## 2020-06-24 NOTE — Telephone Encounter (Signed)
Called patient to schedule at 12/17. Scheduled for that time and date

## 2020-06-24 NOTE — Telephone Encounter (Signed)
Patient needs evaluation to rule out cellulitis, UTI, etc. Please notify [name] that she needs to be seen.

## 2020-06-24 NOTE — Telephone Encounter (Signed)
Called [name] l/m to call office.  Able to reach patient she did not want early am and the first 40 min we had open was on 12/21. Have scheduled for that day. Informed if any new or increased symptoms to call our office and we will get seen sooner.

## 2020-06-24 NOTE — Telephone Encounter (Signed)
[name] with [facility] evaluated pt at home today and reports she noticed some redness around the right breast mastectomy scar and white discharge, Also pt c/o of dysuria for 20 days with dark colored urine, denies any other Sx  Please advise on if you have and recommendations, medications, Rx for pt

## 2020-06-24 NOTE — Telephone Encounter (Signed)
[name] called Returning call

## 2020-06-26 ENCOUNTER — Ambulatory Visit: Payer: BC Managed Care – PPO | Admitting: Primary Care

## 2020-06-26 ENCOUNTER — Other Ambulatory Visit: Payer: Self-pay | Admitting: *Deleted

## 2020-06-26 DIAGNOSIS — Z170 Estrogen receptor positive status [ER+]: Secondary | ICD-10-CM

## 2020-06-26 DIAGNOSIS — C50511 Malignant neoplasm of lower-outer quadrant of right female breast: Secondary | ICD-10-CM

## 2020-06-26 DIAGNOSIS — Z0289 Encounter for other administrative examinations: Secondary | ICD-10-CM

## 2020-06-29 ENCOUNTER — Inpatient Hospital Stay (HOSPITAL_BASED_OUTPATIENT_CLINIC_OR_DEPARTMENT_OTHER): Payer: BC Managed Care – PPO | Admitting: Internal Medicine

## 2020-06-29 ENCOUNTER — Encounter: Payer: BC Managed Care – PPO | Attending: Physician Assistant | Admitting: Physician Assistant

## 2020-06-29 ENCOUNTER — Inpatient Hospital Stay: Payer: BC Managed Care – PPO | Attending: Internal Medicine

## 2020-06-29 ENCOUNTER — Other Ambulatory Visit: Payer: Self-pay

## 2020-06-29 DIAGNOSIS — Z79810 Long term (current) use of selective estrogen receptor modulators (SERMs): Secondary | ICD-10-CM | POA: Diagnosis not present

## 2020-06-29 DIAGNOSIS — I872 Venous insufficiency (chronic) (peripheral): Secondary | ICD-10-CM | POA: Diagnosis not present

## 2020-06-29 DIAGNOSIS — G629 Polyneuropathy, unspecified: Secondary | ICD-10-CM | POA: Insufficient documentation

## 2020-06-29 DIAGNOSIS — E119 Type 2 diabetes mellitus without complications: Secondary | ICD-10-CM | POA: Insufficient documentation

## 2020-06-29 DIAGNOSIS — Z170 Estrogen receptor positive status [ER+]: Secondary | ICD-10-CM | POA: Insufficient documentation

## 2020-06-29 DIAGNOSIS — Z9221 Personal history of antineoplastic chemotherapy: Secondary | ICD-10-CM | POA: Diagnosis not present

## 2020-06-29 DIAGNOSIS — F17200 Nicotine dependence, unspecified, uncomplicated: Secondary | ICD-10-CM | POA: Insufficient documentation

## 2020-06-29 DIAGNOSIS — J449 Chronic obstructive pulmonary disease, unspecified: Secondary | ICD-10-CM | POA: Insufficient documentation

## 2020-06-29 DIAGNOSIS — L97822 Non-pressure chronic ulcer of other part of left lower leg with fat layer exposed: Secondary | ICD-10-CM | POA: Diagnosis not present

## 2020-06-29 DIAGNOSIS — Z9011 Acquired absence of right breast and nipple: Secondary | ICD-10-CM | POA: Insufficient documentation

## 2020-06-29 DIAGNOSIS — E1142 Type 2 diabetes mellitus with diabetic polyneuropathy: Secondary | ICD-10-CM | POA: Diagnosis not present

## 2020-06-29 DIAGNOSIS — D0591 Unspecified type of carcinoma in situ of right breast: Secondary | ICD-10-CM | POA: Insufficient documentation

## 2020-06-29 DIAGNOSIS — C50511 Malignant neoplasm of lower-outer quadrant of right female breast: Secondary | ICD-10-CM

## 2020-06-29 DIAGNOSIS — E11622 Type 2 diabetes mellitus with other skin ulcer: Secondary | ICD-10-CM | POA: Insufficient documentation

## 2020-06-29 DIAGNOSIS — Z79899 Other long term (current) drug therapy: Secondary | ICD-10-CM | POA: Diagnosis not present

## 2020-06-29 DIAGNOSIS — L97929 Non-pressure chronic ulcer of unspecified part of left lower leg with unspecified severity: Secondary | ICD-10-CM | POA: Diagnosis not present

## 2020-06-29 DIAGNOSIS — L97522 Non-pressure chronic ulcer of other part of left foot with fat layer exposed: Secondary | ICD-10-CM | POA: Diagnosis not present

## 2020-06-29 LAB — CBC WITH DIFFERENTIAL/PLATELET
Abs Immature Granulocytes: 0.06 10*3/uL (ref 0.00–0.07)
Basophils Absolute: 0.1 10*3/uL (ref 0.0–0.1)
Basophils Relative: 1 %
Eosinophils Absolute: 0.2 10*3/uL (ref 0.0–0.5)
Eosinophils Relative: 2 %
HCT: 40.3 % (ref 36.0–46.0)
Hemoglobin: 13 g/dL (ref 12.0–15.0)
Immature Granulocytes: 1 %
Lymphocytes Relative: 9 %
Lymphs Abs: 1.1 10*3/uL (ref 0.7–4.0)
MCH: 25.3 pg — ABNORMAL LOW (ref 26.0–34.0)
MCHC: 32.3 g/dL (ref 30.0–36.0)
MCV: 78.4 fL — ABNORMAL LOW (ref 80.0–100.0)
Monocytes Absolute: 0.8 10*3/uL (ref 0.1–1.0)
Monocytes Relative: 7 %
Neutro Abs: 9.2 10*3/uL — ABNORMAL HIGH (ref 1.7–7.7)
Neutrophils Relative %: 80 %
Platelets: 172 10*3/uL (ref 150–400)
RBC: 5.14 MIL/uL — ABNORMAL HIGH (ref 3.87–5.11)
RDW: 21.7 % — ABNORMAL HIGH (ref 11.5–15.5)
WBC: 11.4 10*3/uL — ABNORMAL HIGH (ref 4.0–10.5)
nRBC: 0 % (ref 0.0–0.2)

## 2020-06-29 LAB — COMPREHENSIVE METABOLIC PANEL
ALT: 19 U/L (ref 0–44)
AST: 21 U/L (ref 15–41)
Albumin: 3.5 g/dL (ref 3.5–5.0)
Alkaline Phosphatase: 114 U/L (ref 38–126)
Anion gap: 9 (ref 5–15)
BUN: 27 mg/dL — ABNORMAL HIGH (ref 6–20)
CO2: 25 mmol/L (ref 22–32)
Calcium: 9.1 mg/dL (ref 8.9–10.3)
Chloride: 100 mmol/L (ref 98–111)
Creatinine, Ser: 1.22 mg/dL — ABNORMAL HIGH (ref 0.44–1.00)
GFR, Estimated: 54 mL/min — ABNORMAL LOW (ref >60)
Glucose, Bld: 115 mg/dL — ABNORMAL HIGH (ref 70–99)
Potassium: 4 mmol/L (ref 3.5–5.1)
Sodium: 134 mmol/L — ABNORMAL LOW (ref 135–145)
Total Bilirubin: 0.8 mg/dL (ref 0.3–1.2)
Total Protein: 8.6 g/dL — ABNORMAL HIGH (ref 6.5–8.1)

## 2020-06-29 MED ORDER — ANASTROZOLE 1 MG PO TABS: 1.0000 mg | ORAL_TABLET | Freq: Every day | ORAL | 2 refills | Status: DC

## 2020-06-29 NOTE — Progress Notes (Signed)
Patient c/o 2 loose stools per day. Her Nausea has resolved since stopping the Tamoxifen.

## 2020-06-29 NOTE — Progress Notes (Signed)
[place] NOTE  Patient Care Team: Pleas Koch, NP as PCP - General (Internal Medicine) Rosamaria Lints, MD (Inactive) as Referring Physician (Neurology) Alvester Chou, NP (Nurse Practitioner) Renato Shin, MD as Consulting Physician (Endocrinology) Theodore Demark, RN as Oncology Nurse Navigator Ronny Bacon, MD as Consulting Physician (General Surgery) [facility] as Registered Nurse  CHIEF COMPLAINTS/PURPOSE OF CONSULTATION: Breast cancer.  #  Oncology History Overview Note  #April 2021-2.5 cm right breast mass [incidental-CT scan chest]  # April 2021- RIGHT BREAST [facility]; s/p ER- 51-90%; PR- 51-90%; Her- 2-NEG. G-2. [Dr.Rodenberg]; s/p  Masectomy- pT2 pN0 (sn); Tamoxifen; Poor candidate for chemo; discontinue tamoxifen early DEC 2021 [sec to nausea/vomiting diarrhea]  #Late December 2021- Anastrazole + eligard  # DM- poorly controlled; Seizure disorder/Bipolar/TIA ; Diabetes PN- G-3 [cane/walker; falls]; COPD; OSA active smoker  # SURVIVORSHIP:   # GENETICS:   DIAGNOSIS: Right breast cancer  STAGE:    II     ;  GOALS: goal  CURRENT/MOST RECENT THERAPY : Tam     Carcinoma of lower-outer quadrant of right breast in female, estrogen receptor positive ([place])  11/12/2019 Initial Diagnosis   Carcinoma of lower-outer quadrant of right breast in female, estrogen receptor positive ([place])      HISTORY OF PRESENTING ILLNESS:  [name] 49 y.o.  female premenopausal with history of stage II ER/PR positive HER-2 negative breast cancer status postmastectomy; with multiple comorbidities including poorly controlled diabetes/bipolar disorder/active smoker is here for follow-up.  Patient states that she is having diarrhea/nausea with vomiting since being on tamoxifen.  Patient finally discontinued tamoxifen approximately 2 to 3 weeks ago.  Since she discontinued her tamoxifen patient diarrhea is improved.  However she continues to  complain of intermittent nausea although vomiting is resolved.  Patient had been evaluated by surgery for non-healing mastectomy incision.  As per patient recently she has been discharged from surgery clinic after satisfactory healing of the mastectomy incision.  However patient complains of pus like discharge from the incision site; no fever or chills.  Complains of redness around the mastectomy site.  Complains of stabbing pain intermittently   Review of Systems  Constitutional: Positive for malaise/fatigue. Negative for chills, diaphoresis, fever and weight loss.  HENT: Negative for nosebleeds and sore throat.   Eyes: Negative for double vision.  Respiratory: Negative for cough, hemoptysis, sputum production, shortness of breath and wheezing.   Cardiovascular: Negative for chest pain, palpitations, orthopnea and leg swelling.  Gastrointestinal: Negative for abdominal pain, blood in stool, constipation, diarrhea, heartburn, melena, nausea and vomiting.  Genitourinary: Negative for dysuria, frequency and urgency.  Musculoskeletal: Positive for back pain and joint pain.  Skin: Positive for rash. Negative for itching.  Neurological: Negative for dizziness, tingling, focal weakness, weakness and headaches.  Endo/Heme/Allergies: Does not bruise/bleed easily.  Psychiatric/Behavioral: Negative for depression. The patient is not nervous/anxious and does not have insomnia.      MEDICAL HISTORY:  Past Medical History:  Diagnosis Date  . Anxiety    takes Prozac daily  . Anxiety   . Aortic valve calcification   . Asthma    Advair and Spirva daily  . Asthma   . Bipolar disorder ([place])   . CAD (coronary artery disease)    a. LHC 11/2013 done for CP/fluid retention: mild disease in prox LAD, mild-mod disease in mRCA, EF 60% with normal LVEDP. b. Normal nuc 03/2016.  Marland Kitchen Cancer ([place])   . CHF (congestive heart failure) ([place])   .  Chronic diastolic CHF (congestive heart failure) ([place])   . Chronic  heart failure with preserved ejection fraction ([place]) 11/16/2018  . CKD (chronic kidney disease), stage II   . COPD (chronic obstructive pulmonary disease) ([facility])    a. nocturnal O2.  Marland Kitchen COPD (chronic obstructive pulmonary disease) ([place])   . Coronary artery disease   . Decreased urine stream   . Diabetes mellitus   . Diabetes mellitus without complication ([place])   . Dyspnea   . Family history of adverse reaction to anesthesia    mom gets nauseated  . GERD (gastroesophageal reflux disease)    takes Pepcid daily  . History of blood clots    left leg 3-85yr ago  . Hyperlipidemia   . Hypertension   . Hypertriglyceridemia   . Inguinal hernia, left 01/2015  . Muscle spasm   . Open wound of genital labia   . Peripheral neuropathy   . RBBB   . Seizures ([place]   . Sepsis ([place] 01/19/2018  . Sinus tachycardia    a. persistent since 2009.  .Marland KitchenSmokers' cough ([place]   . Stroke ([facility] 1989   left sided weakness  . TIA (transient ischemic attack)   . Tobacco abuse   . Vulvar abscess 01/23/2018    SURGICAL HISTORY: Past Surgical History:  Procedure Laterality Date  . APPLICATION OF WOUND VAC Right 01/29/2020   Procedure: APPLICATION OF WOUND VAC;  Surgeon: RRonny Bacon MD;  Location: [facility] ORS;  Service: General;  Laterality: Right;  . BREAST BIOPSY Right 11/06/2019   u[place]ore[place]ore path pending venus clip  . HERNIA REPAIR Left   . INCISION AND DRAINAGE ABSCESS N/A 01/26/2018   Procedure: INCISION AND DEBRIDEMENT OF VULVAR NECROTIZING SOFT TISSUE INFECTION;  Surgeon: WGreer Pickerel MD;  [place]  Service: General;  Laterality: N/A;  . INCISION AND DRAINAGE PERIRECTAL ABSCESS N/A 01/22/2018   Procedure: IRRIGATION AND DEBRIDEMENT LABIAL/VULVAR AREA;  Surgeon: BCoralie Keens MD;  [place]  Service: General;  Laterality: N/A;  . INCISION AND DRAINAGE PERIRECTAL ABSCESS N/A 01/29/2018   Procedure: IRRIGATION AND DEBRIDEMENT VULVA;  Surgeon: HExcell Seltzer MD;  [place]   Service: General;  Laterality: N/A;  . INGUINAL HERNIA REPAIR Left 04/08/2015   Procedure: OPEN LEFT INGUINAL HERNIA REPAIR WITH MESH;  Surgeon: ARalene Ok MD;  [place]  Service: General;  Laterality: Left;  . INSERTION OF MESH Left 04/08/2015   Procedure: INSERTION OF MESH;  Surgeon: ARalene Ok MD;  [place]  Service: General;  Laterality: Left;  . LAPAROSCOPY     Endometriosis  . LEFT HEART CATHETERIZATION WITH CORONARY ANGIOGRAM N/A 12/05/2013   Procedure: LEFT HEART CATHETERIZATION WITH CORONARY ANGIOGRAM;  Surgeon: JJettie Booze MD;  [facility];  Service: Cardiovascular;  Laterality: N/A;  . right kidney drained    . SIMPLE MASTECTOMY WITH AXILLARY SENTINEL NODE BIOPSY Right 12/23/2019   Procedure: Right SIMPLE MASTECTOMY WITH AXILLARY SENTINEL NODE BIOPSY;  Surgeon: RRonny Bacon MD;  Location: [facility];  Service: General;  Laterality: Right;  . TEE WITHOUT CARDIOVERSION N/A 11/28/2013   Procedure: TRANSESOPHAGEAL ECHOCARDIOGRAM (TEE);  Surgeon: PThayer Headings MD;  [place]  Service: Cardiovascular;  Laterality: N/A;  . WOUND DEBRIDEMENT Right 01/29/2020   Procedure: DEBRIDEMENT WOUND, Excisional debridement skin, subcutaneous and muscle right chest wall;  Surgeon: RRonny Bacon MD;  Location: [facility] OR[facility];  Service: General;  Laterality: Right;    SOCIAL HISTORY: Social History   Socioeconomic History  .  Marital status: Married    Spouse name: Not on file  . Number of children: 2  . Years of education: Not on file  . Highest education level: Not on file  Occupational History  . Not on file  Tobacco Use  . Smoking status: Current Some Day Smoker    Packs/day: 0.25    Years: 36.00    Pack years: 9.00    Types: Cigarettes    Start date: 07/11/1981  . Smokeless tobacco: Never Used  . Tobacco comment: 1 ppd now  Vaping Use  . Vaping Use: Some days  Substance and Sexual Activity  . Alcohol use: No  . Drug use: No   . Sexual activity: Not Currently  Other Topics Concern  . Not on file  Social History Narrative   ** Merged History Encounter **       Lives in [place]   Married but lives with Boyfriend - not legally separated   Disabled - for BiPolar, Seizure disorder, diabetes   Formerly worked at [employer] 1 ppd; no alcohol. 2 step sons; no biologic children.       Social Determinants of Health   Financial Resource Strain: Not on file  Food Insecurity: Not on file  Transportation Needs: Not on file  Physical Activity: Not on file  Stress: Not on file  Social Connections: Not on file  Intimate Partner Violence: Not on file    FAMILY HISTORY: Family History  Problem Relation Age of Onset  . Venous thrombosis Brother   . Other Brother        BRAIN TUMOR  . Asthma Father   . Diabetes Father   . Coronary artery disease Mother   . Hypertension Mother   . Diabetes Mother   . Breast cancer Mother 72  . Asthma Sister   . Diabetes type II Brother     ALLERGIES:  is allergic to adhesive [tape], metoprolol, montelukast, morphine sulfate, penicillins, prednisone, diltiazem, gabapentin, and midodrine.  MEDICATIONS:  Current Outpatient Medications  Medication Sig Dispense Refill  . acetaminophen (TYLENOL) 500 MG tablet Take 1,000 mg by mouth every 6 (six) hours as needed for moderate pain.     Marland Kitchen albuterol (VENTOLIN HFA) 108 (90 Base) MCG/ACT inhaler Inhale 2 puffs into the lungs every 6 (six) hours as needed for wheezing or shortness of breath.    . ALPRAZolam (XANAX) 1 MG tablet Take 1 mg by mouth 4 (four) times daily as needed for anxiety.     Marland Kitchen anastrozole (ARIMIDEX) 1 MG tablet Take 1 tablet (1 mg total) by mouth daily. 60 tablet 2  . aspirin EC 81 MG tablet Take 81 mg by mouth daily before breakfast.     . benzonatate (TESSALON PERLES) 100 MG capsule Take 1 capsule (100 mg total) by mouth 3 (three) times daily as needed for cough. 20 capsule 0  . budesonide-formoterol  (SYMBICORT) 80-4.5 MCG/ACT inhaler INHALE 2 PUFFS BY MOUTH EVERY 12 HOURS TO PREVENT COUGH OR WHEEZING *RINSE MOUTH AFTER EACH USE* (Patient taking differently: Inhale 2 puffs into the lungs in the morning and at bedtime. ) 10.2 g 3  . busPIRone (BUSPAR) 30 MG tablet Take 30 mg by mouth 2 (two) times daily.    . citalopram (CELEXA) 20 MG tablet Take 1 tablet (20 mg total) by mouth daily. 30 tablet 2  . Dapagliflozin-metFORMIN HCl ER 5-500 MG TB24 Take 1 tablet by mouth daily with breakfast.    . desvenlafaxine (  PRISTIQ) 100 MG 24 hr tablet Take 100 mg by mouth daily.    . famotidine (PEPCID) 20 MG tablet TAKE 1 TABLET (20 MG TOTAL) BY MOUTH 2 (TWO) TIMES DAILY. FOR HEARTBURN. (Patient taking differently: Take 20 mg by mouth 2 (two) times daily. ) 180 tablet 1  . fluconazole (DIFLUCAN) 100 MG tablet Take 1 tablet (100 mg total) by mouth daily. 14 tablet 0  . guaiFENesin-codeine 100-10 MG/5ML syrup Take 5 mLs by mouth 3 (three) times daily as needed for cough. 120 mL 0  . Insulin Pen Needle 32G X 4 MM MISC Used to give insulin injections twice daily. 100 each 11  . insulin regular human CONCENTRATED (HUMULIN R) 500 UNIT/ML injection Inject 30 Units into the skin 3 (three) times daily with meals.     . Insulin Syringe-Needle U-100 (GLOBAL INJECT EASE INSULIN SYR) 30G X 1/2" 1 ML MISC See admin instructions.    Marland Kitchen ipratropium-albuterol (DUONEB) 0.5-2.5 (3) MG/3ML SOLN Take 3 mLs by nebulization every 4 (four) hours as needed (wheezing/shortness of breath). 360 mL 0  . metolazone (ZAROXOLYN) 5 MG tablet Take 5 mg by mouth daily.     Marland Kitchen nystatin-triamcinolone ointment (MYCOLOG) Apply 1 application topically 2 (two) times daily. 30 g 1  . ondansetron (ZOFRAN) 4 MG tablet Take 4 mg by mouth 2 (two) times daily.     Glory Rosebush Delica Lancets 46O MISC 1 each by Does not apply route 3 (three) times daily. Use to monitor glucose levels three times daily; E11.9; E10.42 100 each 11  . ONETOUCH VERIO test strip USE  AS DIRECTED TO CHECK BLOOD SUGAR 3 TIMES DAILY 100 each 0  . OXYGEN Inhale 2-4 L into the lungs 2 (two) times daily as needed (shortness of breath).     . pregabalin (LYRICA) 300 MG capsule Take 1 capsule (300 mg total) by mouth 2 (two) times daily. For nerve pain. 180 capsule 0  . rOPINIRole (REQUIP) 1 MG tablet TAKE 1 TABLET BY MOUTH EVERY NIGHT AT BEDTIME FOR RESTLESS LEGS. 90 tablet 1  . rosuvastatin (CRESTOR) 20 MG tablet Take 1 tablet (20 mg total) by mouth daily. 90 tablet 0  . spironolactone (ALDACTONE) 50 MG tablet TAKE 1 TABLET BY MOUTH ONCE A DAY 90 tablet 1  . tamoxifen (NOLVADEX) 20 MG tablet Take 1 tablet (20 mg total) by mouth daily. 90 tablet 1  . torsemide (DEMADEX) 20 MG tablet TAKE 1 TABLET BY MOUTH 4 TIMES DAILY (Patient taking differently: Take 20 mg by mouth 2 (two) times daily. ) 360 tablet 3  . traZODone (DESYREL) 100 MG tablet Take 200 mg by mouth at bedtime.     Marland Kitchen ULTICARE MINI PEN NEEDLES 31G X 6 MM MISC USE AS DIRECTED 3 TIMES DAILY 100 each 0   No current facility-administered medications for this visit.      Marland Kitchen  PHYSICAL EXAMINATION: ECOG PERFORMANCE STATUS: 1 - Symptomatic but completely ambulatory  Vitals:   06/29/20 1500  BP: 136/71  Pulse: 82  Resp: (!) 22  Temp: (!) 96.3 F (35.7 C)   There were no vitals filed for this visit.  Physical Exam HENT:     Head: Normocephalic and atraumatic.     Mouth/Throat:     Mouth: Oropharynx is clear and moist.     Pharynx: No oropharyngeal exudate.  Eyes:     Pupils: Pupils are equal, round, and reactive to light.  Cardiovascular:     Rate and Rhythm: Normal rate and  regular rhythm.  Pulmonary:     Effort: No respiratory distress.     Breath sounds: No wheezing.     Comments: Decreased breath sounds bilaterally the bases. Abdominal:     General: Bowel sounds are normal. There is no distension.     Palpations: Abdomen is soft. There is no mass.     Tenderness: There is no abdominal tenderness. There is  no guarding or rebound.  Musculoskeletal:        General: No tenderness or edema. Normal range of motion.     Cervical back: Normal range of motion and neck supple.  Skin:    General: Skin is warm.     Comments: Right mastectomy incision-see picture for description  Neurological:     Mental Status: She is alert and oriented to person, place, and time.  Psychiatric:        Mood and Affect: Affect normal.      LABORATORY DATA:  I have reviewed the data as listed Lab Results  Component Value Date   WBC 11.4 (H) 06/29/2020   HGB 13.0 06/29/2020   HCT 40.3 06/29/2020   MCV 78.4 (L) 06/29/2020   PLT 172 06/29/2020   Recent Labs    09/23/19 1732 10/23/19 1619 01/28/20 0911 02/25/20 1240 02/25/20 1504 05/15/20 1312 05/17/20 0307 05/17/20 2336 06/29/20 1450  NA  --    < > 132* 128* 129* 134*  --  132* 134*  K  --    < > 3.8 4.3 4.2 4.3  --  3.8 4.0  CL  --    < > 90* 89* 88* 97  --  100 100  CO2  --    < > 29 26 28 29   --  22 25  GLUCOSE  --    < > 395* 623* 617* 292*  --  422* 115*  BUN  --    < > 35* 43* 40* 28*  --  21* 27*  CREATININE  --    < > 1.42* 1.29* 1.39* 1.13  --  0.96 1.22*  CALCIUM  --    < > 8.6* 8.8* 8.9 9.4  --  8.5* 9.1  GFRNONAA  --    < > 43* 48* 44*  --   --  >60 54*  GFRAA  --    < > 49* 56* 51*  --   --   --   --   PROT 8.8*   < > 8.6*  --  9.1* 8.1 8.2*  --  8.6*  ALBUMIN 4.0   < > 3.5  --  3.5 3.7 3.3*  --  3.5  AST 40   < > 16  --  20 16 21   --  21  ALT 44   < > 19  --  20 19 21   --  19  ALKPHOS 125   < > 124  --  186* 129* 130*  --  114  BILITOT 1.1   < > 1.3*  --  1.2 0.9 1.0  --  0.8  BILIDIR 0.2  --   --   --   --   --  0.2  --   --   IBILI 0.9  --   --   --   --   --  0.8  --   --    < > = values in this interval not displayed.       RADIOGRAPHIC STUDIES: I have personally reviewed the  radiological images as listed and agreed with the findings in the report. No results found.  ASSESSMENT & PLAN:   Carcinoma of lower-outer  quadrant of right breast in female, estrogen receptor positive ([place]) # T2N0- ER/PR-positive; poor candidate for chemotherapy; on tamoxifen; stopped early dec 2021-poor tolerance [sec to nausea/diarrhea]  #Given premenopausal status , recommend proceeding with aromatase inhibitor plus Eligard [ovarian suppression].  Again reviewed the potential side effects including but not limited to hot flashes; arthralgias etc.  #S/p mastectomy -wound healing issues; multiple risk factors including ongoing smoking/poorly controlled diabetes- defer to [place].   #Depression/anxiety/bipolar-currently on BuSpar/Celexa/trazodone.-As per psychiatry.  #Chronic respiratory failure-CHF/COPD-stable  #Poorly controlled diabetes-on insulin; followed endocrinology.  #Peripheral neuropathy grade 2-3; patient goes on with a walker/cane at baseline.  Stable  #Stage III kidney disease-second diabetes-stable  DISPOSITION: # eligard in 1 week # Follow up in 2 months; MD; labs- cbc/cmp- -Dr. Jacinto Reap Dr.Rdenberg   All questions were answered. The patient knows to call the clinic with any problems, questions or concerns.    Cammie Sickle, MD 06/29/2020 10:56 PM

## 2020-06-29 NOTE — Assessment & Plan Note (Addendum)
#   T2N0- ER/PR-positive; poor candidate for chemotherapy; on tamoxifen; stopped early dec 2021-poor tolerance [sec to nausea/diarrhea]  #Given premenopausal status , recommend proceeding with aromatase inhibitor plus Eligard [ovarian suppression].  Again reviewed the potential side effects including but not limited to hot flashes; arthralgias etc.  #S/p mastectomy -wound healing issues; multiple risk factors including ongoing smoking/poorly controlled diabetes- defer to Dr.Rodenberg.   #Depression/anxiety/bipolar-currently on BuSpar/Celexa/trazodone.-As per psychiatry.  #Chronic respiratory failure-CHF/COPD-stable  #Poorly controlled diabetes-on insulin; followed endocrinology.  #Peripheral neuropathy grade 2-3; patient goes on with a walker/cane at baseline.  Stable  #Stage III kidney disease-second diabetes-stable  DISPOSITION: # eligard in 1 week # Follow up in 2 months; MD; labs- cbc/cmp- -Dr. B Dr.Rdenberg  

## 2020-06-29 NOTE — Progress Notes (Signed)
BRIANA, FARNER ([id number]) Visit Report for 06/29/2020 Abuse/Suicide Risk Screen Details Patient Name: [name], [name] Date of Service: 06/29/2020 9:15 AM Medical Record Number: [id number] Patient Account Number: [id number] Date of Birth/Sex: May 30, 1971 (49 y.o. F) Treating RN: Cornell Barman Primary Care Denis Carreon: Alma Friendly Other Clinician: Referring Bonnie Roig: Alma Friendly Treating Lakindra Wible/Extender: Skipper Cliche in Treatment: 0 Abuse/Suicide Risk Screen Items Answer ABUSE RISK SCREEN: Has anyone close to you tried to hurt or harm you recentlyo No Do you feel uncomfortable with anyone in your familyo No Has anyone forced you do things that you didnot want to doo No Electronic Signature(s) Signed: 06/29/2020 4:39:05 PM By: Gretta Cool, BSN, RN, CWS, Kim RN, BSN Entered By: Gretta Cool, BSN, RN, CWS, Kim on 06/29/2020 09:46:27 [name] ([id number]) -------------------------------------------------------------------------------- Activities of Daily Living Details Patient Name: [name] Date of Service: 06/29/2020 9:15 AM Medical Record Number: [id number] Patient Account Number: [id number] Date of Birth/Sex: [birth year] (49 y.o. F) Treating RN: Cornell Barman Primary Care Madyx Delfin: Alma Friendly Other Clinician: Referring Nycholas Rayner: Alma Friendly Treating Wilma Michaelson/Extender: Skipper Cliche in Treatment: 0 Activities of Daily Living Items Answer Activities of Daily Living (Please select one for each item) Drive Automobile Completely Able Take Medications Completely Able Use Telephone Completely Able Care for Appearance Need Assistance Use Toilet Need Assistance Bath / Shower Need Assistance Dress Self Need Assistance Feed Self Completely Able Walk Need Assistance Get In / Out Bed Need Assistance Housework Not Able Prepare Meals Need Assistance Handle Money Completely Able Shop for Self Completely Able Electronic Signature(s) Signed: 06/29/2020 4:39:05 PM By:  Gretta Cool, BSN, RN, CWS, Kim RN, BSN Entered By: Gretta Cool, BSN, RN, CWS, Kim on 06/29/2020 09:47:14 [name] ([id number]) -------------------------------------------------------------------------------- Education Screening Details Patient Name: [name] Date of Service: 06/29/2020 9:15 AM Medical Record Number: [id number] Patient Account Number: [id number] Date of Birth/Sex: 04-27-1971 (49 y.o. F) Treating RN: Cornell Barman Primary Care Azaylah Stailey: Alma Friendly Other Clinician: Referring Chastin Garlitz: Alma Friendly Treating Solomiya Pascale/Extender: Skipper Cliche in Treatment: 0 Primary Learner Assessed: Patient Learning Preferences/Education Level/Primary Language Learning Preference: Explanation, Demonstration Highest Education Level: High School Preferred Language: English Cognitive Barrier Language Barrier: No Translator Needed: No Memory Deficit: No Emotional Barrier: No Physical Barrier Impaired Vision: No Impaired Hearing: No Decreased Hand dexterity: No Knowledge/Comprehension Knowledge Level: High Comprehension Level: High Ability to understand written instructions: High Ability to understand verbal instructions: High Motivation Anxiety Level: Calm Cooperation: Cooperative Education Importance: Acknowledges Need Interest in Health Problems: Asks Questions Perception: Coherent Willingness to Engage in Self-Management High Activities: Readiness to Engage in Self-Management High Activities: Electronic Signature(s) Signed: 06/29/2020 4:39:05 PM By: Gretta Cool, BSN, RN, CWS, Kim RN, BSN Entered By: Gretta Cool, BSN, RN, CWS, Kim on 06/29/2020 09:47:45 [place], [name] ([id number]) -------------------------------------------------------------------------------- Fall Risk Assessment Details Patient Name: [name] Date of Service: 06/29/2020 9:15 AM Medical Record Number: [id number] Patient Account Number: [id number] Date of Birth/Sex: 1971/04/01 (49 y.o. F) Treating RN:  Cornell Barman Primary Care Nene Aranas: Alma Friendly Other Clinician: Referring Nakenya Theall: Alma Friendly Treating Marquet Faircloth/Extender: Skipper Cliche in Treatment: 0 Fall Risk Assessment Items Have you had 2 or more falls in the last 12 monthso 0 No Have you had any fall that resulted in injury in the last 12 monthso 0 No FALLS RISK SCREEN History of falling - immediate or within 3 months 0 No Secondary diagnosis (Do you have 2 or more medical diagnoseso) 0 No Ambulatory aid None/bed rest/wheelchair/nurse 0 No Crutches/cane/walker 15 Yes Furniture 0 No Intravenous therapy Access/Saline/Heparin Lock 0 No Gait/Transferring Normal/ bed rest/  wheelchair 0 No Weak (short steps with or without shuffle, stooped but able to lift head while walking, may 10 Yes seek support from furniture) Impaired (short steps with shuffle, may have difficulty arising from chair, head down, impaired 0 No balance) Mental Status Oriented to own ability 0 Yes Electronic Signature(s) Signed: 06/29/2020 4:39:05 PM By: Gretta Cool, BSN, RN, CWS, Kim RN, BSN Entered By: Gretta Cool, BSN, RN, CWS, Kim on 06/29/2020 09:48:14 [place], [name] ([id number]) -------------------------------------------------------------------------------- Foot Assessment Details Patient Name: [name] Date of Service: 06/29/2020 9:15 AM Medical Record Number: [id number] Patient Account Number: [id number] Date of Birth/Sex: 1971/03/13 (49 y.o. F) Treating RN: Cornell Barman Primary Care Daneesha Quinteros: Alma Friendly Other Clinician: Referring Giacomo Valone: Alma Friendly Treating Tino Ronan/Extender: Skipper Cliche in Treatment: 0 Foot Assessment Items Site Locations + = Sensation present, - = Sensation absent, C = Callus, U = Ulcer R = Redness, W = Warmth, M = Maceration, PU = Pre-ulcerative lesion F = Fissure, S = Swelling, D = Dryness Assessment Right: Left: Other Deformity: No No Prior Foot Ulcer: No No Prior Amputation: No No Charcot  Joint: No No Ambulatory Status: Ambulatory With Help Assistance Device: Walker Gait: Administrator, arts) Signed: 06/29/2020 4:39:05 PM By: Gretta Cool, BSN, RN, CWS, Kim RN, BSN Entered By: Gretta Cool, BSN, RN, CWS, Kim on 06/29/2020 09:50:22 [place], [name] ([id number]) -------------------------------------------------------------------------------- Nutrition Risk Screening Details Patient Name: [name] Date of Service: 06/29/2020 9:15 AM Medical Record Number: [id number] Patient Account Number: [id number] Date of Birth/Sex: [birth year] (49 y.o. F) Treating RN: Cornell Barman Primary Care Paul Torpey: Alma Friendly Other Clinician: Referring Naiah Donahoe: Alma Friendly Treating Yomira Flitton/Extender: Skipper Cliche in Treatment: 0 Height (in): 64 Weight (lbs): 260 Body Mass Index (BMI): 44.6 Nutrition Risk Screening Items Score Screening NUTRITION RISK SCREEN: I have an illness or condition that made me change the kind and/or amount of food I eat 0 No I eat fewer than two meals per day 0 No I eat few fruits and vegetables, or milk products 0 No I have three or more drinks of beer, liquor or wine almost every day 0 No I have tooth or mouth problems that make it hard for me to eat 0 No I don't always have enough money to buy the food I need 0 No I eat alone most of the time 0 No I take three or more different prescribed or over-the-counter drugs a day 1 Yes Without wanting to, I have lost or gained 10 pounds in the last six months 0 No I am not always physically able to shop, cook and/or feed myself 0 No Nutrition Protocols Good Risk Protocol Moderate Risk Protocol 0 Provide education on nutrition High Risk Proctocol Risk Level: Good Risk Score: 1 Electronic Signature(s) Signed: 06/29/2020 4:39:05 PM By: Gretta Cool, BSN, RN, CWS, Kim RN, BSN Entered By: Gretta Cool, BSN, RN, CWS, Kim on 06/29/2020 09:48:37

## 2020-06-29 NOTE — Progress Notes (Addendum)
[name], [name] ([id number]) Visit Report for 06/29/2020 Allergy List Details Patient Name: [name], [name] Date of Service: 06/29/2020 9:15 AM Medical Record Number: [id number] Patient Account Number: [id number] Date of Birth/Sex: [birth year] (49 y.o. F) Treating RN: Cornell Barman Primary Care Shaterica Mcclatchy: Alma Friendly Other Clinician: Referring Yamilette Garretson: Alma Friendly Treating Keily Lepp/Extender: Skipper Cliche in Treatment: 0 Allergies Active Allergies penicillin Reaction: rash Severity: Moderate Type: Food Singulair Reaction: tongue swelling Severity: Severe Type: Medication metoprolol Type: Food morphine Reaction: face swelling, unable to swallow Severity: Severe Type: Food prednisone Reaction: throat swelling Severity: Severe Type: Food diltiazem Type: Food Allergy Notes Electronic Signature(s) Signed: 06/29/2020 4:39:05 PM By: Gretta Cool, BSN, RN, CWS, Kim RN, BSN Entered By: Gretta Cool, BSN, RN, CWS, Kim on 06/29/2020 09:43:15 [name], [name] ([id number]) -------------------------------------------------------------------------------- Arrival Information Details Patient Name: [name] Date of Service: 06/29/2020 9:15 AM Medical Record Number: [id number] Patient Account Number: [id number] Date of Birth/Sex: November 02, 1970 (49 y.o. F) Treating RN: Cornell Barman Primary Care Gaylene Moylan: Alma Friendly Other Clinician: Referring Jennifer Holland: Alma Friendly Treating Jamillia Closson/Extender: Skipper Cliche in Treatment: 0 Visit Information Patient Arrived: Wheel Chair Arrival Time: 09:24 Accompanied By: husband Transfer Assistance: Manual Patient Identification Verified: Yes Secondary Verification Process Completed: Yes History Since Last Visit Added or deleted any medications: No Any new allergies or adverse reactions: No Had a fall or experienced change in activities of daily living that may affect risk of falls: No Signs or symptoms of abuse/neglect since last visito  No Hospitalized since last visit: No Implantable device outside of the clinic excluding cellular tissue based products placed in the center since last visit: No Electronic Signature(s) Signed: 06/29/2020 4:39:32 PM By: Lorine Bears RCP, RRT, CHT Entered By: Lorine Bears on 06/29/2020 09:28:01 [name], [name] ([id number]) -------------------------------------------------------------------------------- Clinic Level of Care Assessment Details Patient Name: [name] Date of Service: 06/29/2020 9:15 AM Medical Record Number: [id number] Patient Account Number: [id number] Date of Birth/Sex: 11/11/1970 (49 y.o. F) Treating RN: Cornell Barman Primary Care Scherry Laverne: Alma Friendly Other Clinician: Referring Pamla Pangle: Alma Friendly Treating Ryana Montecalvo/Extender: Skipper Cliche in Treatment: 0 Clinic Level of Care Assessment Items TOOL 2 Quantity Score []  - Use when only an EandM is performed on the INITIAL visit 0 ASSESSMENTS - Nursing Assessment / Reassessment X - General Physical Exam (combine w/ comprehensive assessment (listed just below) when performed on new 1 20 pt. evals) X- 1 25 Comprehensive Assessment (HX, ROS, Risk Assessments, Wounds Hx, etc.) ASSESSMENTS - Wound and Skin Assessment / Reassessment X - Simple Wound Assessment / Reassessment - one wound 1 5 []  - 0 Complex Wound Assessment / Reassessment - multiple wounds []  - 0 Dermatologic / Skin Assessment (not related to wound area) ASSESSMENTS - Ostomy and/or Continence Assessment and Care []  - Incontinence Assessment and Management 0 []  - 0 Ostomy Care Assessment and Management (repouching, etc.) PROCESS - Coordination of Care X - Simple Patient / Family Education for ongoing care 1 15 []  - 0 Complex (extensive) Patient / Family Education for ongoing care X- 1 10 Staff obtains Programmer, systems, Records, Test Results / Process Orders []  - 0 Staff telephones HHA, Nursing Homes / Clarify orders  / etc []  - 0 Routine Transfer to another Facility (non-emergent condition) []  - 0 Routine Hospital Admission (non-emergent condition) X- 1 15 New Admissions / Biomedical engineer / Ordering NPWT, Apligraf, etc. []  - 0 Emergency Hospital Admission (emergent condition) X- 1 10 Simple Discharge Coordination []  - 0 Complex (extensive) Discharge Coordination PROCESS - Special Needs []  - Pediatric / Minor  Patient Management 0 []  - 0 Isolation Patient Management []  - 0 Hearing / Language / Visual special needs []  - 0 Assessment of Community assistance (transportation, D/C planning, etc.) []  - 0 Additional assistance / Altered mentation []  - 0 Support Surface(s) Assessment (bed, cushion, seat, etc.) INTERVENTIONS - Wound Cleansing / Measurement X - Wound Imaging (photographs - any number of wounds) 1 5 []  - 0 Wound Tracing (instead of photographs) []  - 0 Simple Wound Measurement - one wound X- 2 5 Complex Wound Measurement - multiple wounds [name], [name] ([id number]) []  - 0 Simple Wound Cleansing - one wound X- 2 5 Complex Wound Cleansing - multiple wounds INTERVENTIONS - Wound Dressings []  - Small Wound Dressing one or multiple wounds 0 X- 1 15 Medium Wound Dressing one or multiple wounds []  - 0 Large Wound Dressing one or multiple wounds []  - 0 Application of Medications - injection INTERVENTIONS - Miscellaneous []  - External ear exam 0 []  - 0 Specimen Collection (cultures, biopsies, blood, body fluids, etc.) []  - 0 Specimen(s) / Culture(s) sent or taken to Lab for analysis []  - 0 Patient Transfer (multiple staff / Civil Service fast streamer / Similar devices) []  - 0 Simple Staple / Suture removal (25 or less) []  - 0 Complex Staple / Suture removal (26 or more) []  - 0 Hypo / Hyperglycemic Management (close monitor of Blood Glucose) X- 1 15 Ankle / Brachial Index (ABI) - do not check if billed separately Has the patient been seen at the hospital within the last three  years: Yes Total Score: 155 Level Of Care: New/Established - Level 4 Electronic Signature(s) Signed: 06/29/2020 4:39:05 PM By: Gretta Cool, BSN, RN, CWS, Kim RN, BSN Entered By: Gretta Cool, BSN, RN, CWS, Kim on 06/29/2020 10:08:39 [name] ([id number]) -------------------------------------------------------------------------------- Encounter Discharge Information Details Patient Name: [name] Date of Service: 06/29/2020 9:15 AM Medical Record Number: [id number] Patient Account Number: [id number] Date of Birth/Sex: [birth year] (49 y.o. F) Treating RN: Cornell Barman Primary Care Kaelen Caughlin: Alma Friendly Other Clinician: Referring Yashira Offenberger: Alma Friendly Treating Gabriella Guile/Extender: Skipper Cliche in Treatment: 0 Encounter Discharge Information Items Discharge Condition: Stable Ambulatory Status: Ambulatory Discharge Destination: Home Transportation: Private Auto Accompanied By: self Schedule Follow-up Appointment: Yes Clinical Summary of Care: Electronic Signature(s) Signed: 06/29/2020 4:39:05 PM By: Gretta Cool, BSN, RN, CWS, Kim RN, BSN Entered By: Gretta Cool, BSN, RN, CWS, Kim on 06/29/2020 10:19:10 [name] ([id number]) -------------------------------------------------------------------------------- Lower Extremity Assessment Details Patient Name: [name] Date of Service: 06/29/2020 9:15 AM Medical Record Number: [id number] Patient Account Number: [id number] Date of Birth/Sex: 10-26-1970 (49 y.o. F) Treating RN: Cornell Barman Primary Care Shadow Stiggers: Alma Friendly Other Clinician: Referring Joniyah Mallinger: Alma Friendly Treating Whittaker Lenis/Extender: Skipper Cliche in Treatment: 0 Edema Assessment Assessed: [Left: Yes] [Right: No] Edema: [Left: Ye] [Right: s] Calf Left: Right: Point of Measurement: 30 cm From Medial Instep 47 cm Ankle Left: Right: Point of Measurement: 10 cm From Medial Instep 27 cm Vascular Assessment Pulses: Dorsalis Pedis Palpable:  [Left:Yes] Blood Pressure: Brachial: [Left:142] Dorsalis Pedis: 138 Ankle: Posterior Tibial: 140 Ankle Brachial Index: [Left:0.99] Electronic Signature(s) Signed: 06/29/2020 4:39:05 PM By: Gretta Cool, BSN, RN, CWS, Kim RN, BSN Entered By: Gretta Cool, BSN, RN, CWS, Kim on 06/29/2020 09:56:14 Walkowski, [name] ([id number]) -------------------------------------------------------------------------------- Multi Wound Chart Details Patient Name: [name] Date of Service: 06/29/2020 9:15 AM Medical Record Number: [id number] Patient Account Number: [id number] Date of Birth/Sex: August 18, 1970 (49 y.o. F) Treating RN: Cornell Barman Primary Care Shanice Poznanski: Alma Friendly Other Clinician: Referring Damico Partin: Alma Friendly Treating Ronica Vivian/Extender: Skipper Cliche  in Treatment: 0 Vital Signs Height(in): 64 Pulse(bpm): 93 Weight(lbs): 260 Blood Pressure(mmHg): 131/76 Body Mass Index(BMI): 45 Temperature(F): 97.9 Respiratory Rate(breaths/min): 18 Photos: [N/A:N/A] Wound Location: Left, Proximal, Midline, Anterior Left, Distal, Midline, Anterior Lower N/A Lower Leg Leg Wounding Event: Trauma Gradually Appeared N/A Primary Etiology: Venous Leg Ulcer Venous Leg Ulcer N/A Secondary Etiology: Diabetic Wound/Ulcer of the Lower Diabetic Wound/Ulcer of the Lower N/A Extremity Extremity Date Acquired: 06/25/2020 04/10/2020 N/A Weeks of Treatment: 0 0 N/A Wound Status: Open Open N/A Measurements L x W x D (cm) 1x1.5x0.1 3.4x2.2x0.1 N/A Area (cm) : 1.178 5.875 N/A Volume (cm) : 0.118 0.587 N/A % Reduction in Area: 0.00% N/A N/A % Reduction in Volume: 0.00% N/A N/A Classification: Full Thickness Without Exposed Full Thickness Without Exposed N/A Support Structures Support Structures Exudate Amount: Large Large N/A Exudate Type: Serous Serous N/A Exudate Color: amber amber N/A Wound Margin: Indistinct, nonvisible Indistinct, nonvisible N/A Granulation Amount: Large (67-100%) Medium (34-66%)  N/A Granulation Quality: Red Red N/A Necrotic Amount: None Present (0%) Medium (34-66%) N/A Exposed Structures: Fat Layer (Subcutaneous Tissue): Fat Layer (Subcutaneous Tissue): N/A Yes Yes Fascia: No Fascia: No Tendon: No Tendon: No Muscle: No Muscle: No Joint: No Joint: No Bone: No Bone: No Epithelialization: None None N/A Treatment Notes Electronic Signature(s) Signed: 06/29/2020 4:39:05 PM By: Gretta Cool, BSN, RN, CWS, Kim RN, BSN Entered By: Gretta Cool, BSN, RN, CWS, Kim on 06/29/2020 10:07:46 [name] ([id number]) -------------------------------------------------------------------------------- Multi-Disciplinary Care Plan Details Patient Name: [name] Date of Service: 06/29/2020 9:15 AM Medical Record Number: [id number] Patient Account Number: [id number] Date of Birth/Sex: [birth year] (49 y.o. F) Treating RN: Cornell Barman Primary Care Holden Maniscalco: Alma Friendly Other Clinician: Referring Marieann Zipp: Alma Friendly Treating Maxwell Lemen/Extender: Skipper Cliche in Treatment: 0 Active Inactive Electronic Signature(s) Signed: 08/04/2020 1:20:29 PM By: Gretta Cool, BSN, RN, CWS, Kim RN, BSN Previous Signature: 06/29/2020 4:39:05 PM Version By: Gretta Cool, BSN, RN, CWS, Kim RN, BSN Entered By: Gretta Cool, BSN, RN, CWS, Kim on 08/04/2020 13:20:28 [name] ([id number]) -------------------------------------------------------------------------------- Pain Assessment Details Patient Name: [name] Date of Service: 06/29/2020 9:15 AM Medical Record Number: [id number] Patient Account Number: [id number] Date of Birth/Sex: 10/16/1970 (49 y.o. F) Treating RN: Cornell Barman Primary Care Lashea Goda: Alma Friendly Other Clinician: Referring Jencarlo Bonadonna: Alma Friendly Treating Rondle Lohse/Extender: Skipper Cliche in Treatment: 0 Active Problems Location of Pain Severity and Description of Pain Patient Has Paino Yes Site Locations Rate the pain. Current Pain Level: 7 Pain Management and  Medication Current Pain Management: Electronic Signature(s) Signed: 06/29/2020 4:39:05 PM By: Gretta Cool, BSN, RN, CWS, Kim RN, BSN Signed: 06/29/2020 4:39:32 PM By: Lorine Bears RCP, RRT, CHT Entered By: Lorine Bears on 06/29/2020 09:28:20 TATISHA, CERINO ([id number]) -------------------------------------------------------------------------------- Patient/Caregiver Education Details Patient Name: [name] Date of Service: 06/29/2020 9:15 AM Medical Record Number: [id number] Patient Account Number: [id number] Date of Birth/Gender: 1971/01/19 (49 y.o. F) Treating RN: Cornell Barman Primary Care Physician: Alma Friendly Other Clinician: Referring Physician: Alma Friendly Treating Physician/Extender: Skipper Cliche in Treatment: 0 Education Assessment Education Provided To: Patient Education Topics Provided Elevated Blood Sugar/ Impact on Healing: Handouts: Elevated Blood Sugars: How Do They Affect Wound Healing Methods: Demonstration, Explain/Verbal Responses: State content correctly Smoking and Wound Healing: Handouts: Smoking and Wound Healing Methods: Demonstration, Explain/Verbal Responses: State content correctly Welcome To [facility]: Handouts: Welcome To [facility] Methods: Demonstration, Explain/Verbal Responses: State content correctly Wound/Skin Impairment: Handouts: Caring for Your Ulcer Methods: Demonstration, Explain/Verbal Responses: State content correctly Electronic Signature(s) Signed: 06/29/2020 4:39:05 PM  By: Gretta Cool, BSN, RN, CWS, Kim RN, BSN Entered By: Gretta Cool, BSN, RN, CWS, Kim on 06/29/2020 10:15:07 [name] ([id number]) -------------------------------------------------------------------------------- Wound Assessment Details Patient Name: [name] Date of Service: 06/29/2020 9:15 AM Medical Record Number: [id number] Patient Account Number: [id number] Date of Birth/Sex: May 11, 1971 (49  y.o. F) Treating RN: Cornell Barman Primary Care Mikka Kissner: Alma Friendly Other Clinician: Referring Fia Hebert: Alma Friendly Treating Jehan Ranganathan/Extender: Skipper Cliche in Treatment: 0 Wound Status Wound Number: 7 Primary Etiology: Venous Leg Ulcer Wound Location: Left, Proximal, Midline, Anterior Lower Leg Secondary Etiology: Diabetic Wound/Ulcer of the Lower Extremity Wounding Event: Trauma Wound Status: Open Date Acquired: 06/25/2020 Weeks Of Treatment: 0 Clustered Wound: No Photos Wound Measurements Length: (cm) 1 Width: (cm) 1.5 Depth: (cm) 0.1 Area: (cm) 1.178 Volume: (cm) 0.118 % Reduction in Area: 0% % Reduction in Volume: 0% Epithelialization: None Tunneling: No Undermining: No Wound Description Classification: Full Thickness Without Exposed Support Structu Wound Margin: Indistinct, nonvisible Exudate Amount: Large Exudate Type: Serous Exudate Color: amber res Foul Odor After Cleansing: No Slough/Fibrino No Wound Bed Granulation Amount: Large (67-100%) Exposed Structure Granulation Quality: Red Fascia Exposed: No Necrotic Amount: None Present (0%) Fat Layer (Subcutaneous Tissue) Exposed: Yes Tendon Exposed: No Muscle Exposed: No Joint Exposed: No Bone Exposed: No Electronic Signature(s) Signed: 06/29/2020 4:39:05 PM By: Gretta Cool, BSN, RN, CWS, Kim RN, BSN Entered By: Gretta Cool, BSN, RN, CWS, Kim on 06/29/2020 09:39:25 Kuhlman, [name] ([id number]) -------------------------------------------------------------------------------- Wound Assessment Details Patient Name: [name] Date of Service: 06/29/2020 9:15 AM Medical Record Number: [id number] Patient Account Number: [id number] Date of Birth/Sex: December 12, 1970 (49 y.o. F) Treating RN: Cornell Barman Primary Care Jodell Weitman: Alma Friendly Other Clinician: Referring Takari Duncombe: Alma Friendly Treating Reggie Bise/Extender: Skipper Cliche in Treatment: 0 Wound Status Wound Number: 8 Primary Etiology:  Venous Leg Ulcer Wound Location: Left, Distal, Midline, Anterior Lower Leg Secondary Etiology: Diabetic Wound/Ulcer of the Lower Extremity Wounding Event: Gradually Appeared Wound Status: Open Date Acquired: 04/10/2020 Weeks Of Treatment: 0 Clustered Wound: No Photos Wound Measurements Length: (cm) 3.4 Width: (cm) 2.2 Depth: (cm) 0.1 Area: (cm) 5.875 Volume: (cm) 0.587 % Reduction in Area: % Reduction in Volume: Epithelialization: None Tunneling: No Undermining: No Wound Description Classification: Full Thickness Without Exposed Support Structu Wound Margin: Indistinct, nonvisible Exudate Amount: Large Exudate Type: Serous Exudate Color: amber res Foul Odor After Cleansing: No Slough/Fibrino Yes Wound Bed Granulation Amount: Medium (34-66%) Exposed Structure Granulation Quality: Red Fascia Exposed: No Necrotic Amount: Medium (34-66%) Fat Layer (Subcutaneous Tissue) Exposed: Yes Necrotic Quality: Adherent Slough Tendon Exposed: No Muscle Exposed: No Joint Exposed: No Bone Exposed: No Electronic Signature(s) Signed: 06/29/2020 4:39:05 PM By: Gretta Cool, BSN, RN, CWS, Kim RN, BSN Entered By: Gretta Cool, BSN, RN, CWS, Kim on 06/29/2020 09:38:11 Commisso, [name] ([id number]) -------------------------------------------------------------------------------- [place] Details Patient Name: [name] Date of Service: 06/29/2020 9:15 AM Medical Record Number: [id number] Patient Account Number: [id number] Date of Birth/Sex: 02/11/1971 (49 y.o. F) Treating RN: Cornell Barman Primary Care Shay Bartoli: Alma Friendly Other Clinician: Referring Raul Winterhalter: Alma Friendly Treating Meline Russaw/Extender: Skipper Cliche in Treatment: 0 Vital Signs Time Taken: 09:25 Temperature (F): 97.9 Height (in): 64 Pulse (bpm): 93 Source: Stated Respiratory Rate (breaths/min): 18 Weight (lbs): 260 Blood Pressure (mmHg): 131/76 Source: Stated Reference Range: 80 - 120 mg / dl Body Mass Index (BMI):  44.6 Electronic Signature(s) Signed: 06/29/2020 4:39:32 PM By: Lorine Bears RCP, RRT, CHT Entered By: Lorine Bears on 06/29/2020 09:29:14

## 2020-06-30 ENCOUNTER — Other Ambulatory Visit: Payer: Self-pay | Admitting: Internal Medicine

## 2020-06-30 ENCOUNTER — Ambulatory Visit: Payer: BC Managed Care – PPO | Admitting: Primary Care

## 2020-07-01 ENCOUNTER — Other Ambulatory Visit: Payer: Self-pay | Admitting: Endocrinology

## 2020-07-01 ENCOUNTER — Encounter: Payer: Self-pay | Admitting: Primary Care

## 2020-07-01 ENCOUNTER — Ambulatory Visit (INDEPENDENT_AMBULATORY_CARE_PROVIDER_SITE_OTHER): Payer: BC Managed Care – PPO | Admitting: Primary Care

## 2020-07-01 ENCOUNTER — Ambulatory Visit (INDEPENDENT_AMBULATORY_CARE_PROVIDER_SITE_OTHER)
Admission: RE | Admit: 2020-07-01 | Discharge: 2020-07-01 | Disposition: A | Discharge status: Home / Self Care | Payer: BC Managed Care – PPO | Source: Ambulatory Visit | Attending: Primary Care | Admitting: Primary Care

## 2020-07-01 ENCOUNTER — Other Ambulatory Visit: Payer: Self-pay | Admitting: Primary Care

## 2020-07-01 ENCOUNTER — Other Ambulatory Visit: Payer: Self-pay

## 2020-07-01 VITALS — BP 134/62 | HR 54 | Temp 96.6°F | Ht 64.0 in | Wt 220.0 lb

## 2020-07-01 DIAGNOSIS — R059 Cough, unspecified: Secondary | ICD-10-CM | POA: Diagnosis not present

## 2020-07-01 DIAGNOSIS — E1042 Type 1 diabetes mellitus with diabetic polyneuropathy: Secondary | ICD-10-CM

## 2020-07-01 DIAGNOSIS — R3129 Other microscopic hematuria: Secondary | ICD-10-CM

## 2020-07-01 DIAGNOSIS — L97929 Non-pressure chronic ulcer of unspecified part of left lower leg with unspecified severity: Secondary | ICD-10-CM

## 2020-07-01 DIAGNOSIS — J441 Chronic obstructive pulmonary disease with (acute) exacerbation: Secondary | ICD-10-CM

## 2020-07-01 DIAGNOSIS — R300 Dysuria: Secondary | ICD-10-CM | POA: Diagnosis not present

## 2020-07-01 DIAGNOSIS — R0600 Dyspnea, unspecified: Secondary | ICD-10-CM | POA: Diagnosis not present

## 2020-07-01 DIAGNOSIS — J90 Pleural effusion, not elsewhere classified: Secondary | ICD-10-CM | POA: Diagnosis not present

## 2020-07-01 DIAGNOSIS — E1165 Type 2 diabetes mellitus with hyperglycemia: Secondary | ICD-10-CM

## 2020-07-01 DIAGNOSIS — J449 Chronic obstructive pulmonary disease, unspecified: Secondary | ICD-10-CM | POA: Diagnosis not present

## 2020-07-01 DIAGNOSIS — N898 Other specified noninflammatory disorders of vagina: Secondary | ICD-10-CM

## 2020-07-01 DIAGNOSIS — R918 Other nonspecific abnormal finding of lung field: Secondary | ICD-10-CM | POA: Insufficient documentation

## 2020-07-01 HISTORY — DX: Other microscopic hematuria: R31.29

## 2020-07-01 LAB — URINALYSIS, MICROSCOPIC ONLY

## 2020-07-01 LAB — POC URINALSYSI DIPSTICK (AUTOMATED)
Bilirubin, UA: NEGATIVE
Glucose, UA: NEGATIVE
Ketones, UA: NEGATIVE
Leukocytes, UA: NEGATIVE
Nitrite, UA: NEGATIVE
Protein, UA: POSITIVE — AB
Spec Grav, UA: 1.015 (ref 1.010–1.025)
Urobilinogen, UA: 0.2 E.U./dL
pH, UA: 5.5 (ref 5.0–8.0)

## 2020-07-01 LAB — POCT GLYCOSYLATED HEMOGLOBIN (HGB A1C): Hemoglobin A1C: 11.1 % — AB (ref 4.0–5.6)

## 2020-07-01 MED ORDER — BENZONATATE 200 MG PO CAPS: 200.0000 mg | ORAL_CAPSULE | Freq: Three times a day (TID) | ORAL | 0 refills | Status: DC | PRN

## 2020-07-01 MED ORDER — DOXYCYCLINE HYCLATE 100 MG PO TABS: 100.0000 mg | ORAL_TABLET | Freq: Two times a day (BID) | ORAL | 0 refills | Status: DC

## 2020-07-01 NOTE — Assessment & Plan Note (Signed)
Following with endocrinology, Dr. Honor Junes, but has only been for one visit. A1C today of 11.1, discussed this with patient today.  Given her complex medical history I recommended that she schedule an appointment for follow up with her endocrinologist ASAP. Continue current prescribed regimen.

## 2020-07-01 NOTE — Assessment & Plan Note (Signed)
Following with wound management, dressing intact to left lower extremity.

## 2020-07-01 NOTE — Assessment & Plan Note (Signed)
Symptoms today represent COPD exacerbation, however it is very difficult to discern whether a lot of her symptoms are chronic or new.   She is in no distress today, lung fields actually sound clear. Oxygen saturation today on RA is 98%, she does not sound congested, no cough during visit.   Start with chest xray as she does have a history of CHF. I am reticent to treat with steroids given her non distressed appearance and clear lungs, especially given her uncontrolled diabetes.   Await chest xray results. She is very stable in our office today.

## 2020-07-01 NOTE — Assessment & Plan Note (Signed)
Noted on UA today and also on three other Urinalysis tests throughout this year. Urine microscopic ordered and pending.  Given history of breast cancer, and now potential brain cancer, we need to consider bladder mass/carcinoma. I will consult with her oncologist first. CT abdomen/pelvis from 2019 without bladder mass or abnormality.

## 2020-07-01 NOTE — Patient Instructions (Signed)
Complete xray(s) prior to leaving today. I will notify you of your results once received.  We will be in touch once we receive your urine culture test.  Please schedule a visit with Dr. Honor Junes, your diabetes doctor as your A1C is 11.1 today.  It was a pleasure to see you today!

## 2020-07-01 NOTE — Assessment & Plan Note (Signed)
Acute for the last week, some improvement with Monistat OTC. She declines a vaginal exam and wet prep today despite recommendations.  UA today with 1+ blood, otherwise negative. No glucosuria.  Culture sent  Consider treatment with fluconazole empirically given history of uncontrolled diabetes, but will await urine culture results first.

## 2020-07-01 NOTE — Progress Notes (Signed)
Subjective:    Patient ID: [name], female    DOB: [birth year], 49 y.o.   MRN: [id number]  HPI  This visit occurred during the SARS-CoV-2 public health emergency.  Safety protocols were in place, including screening questions prior to the visit, additional usage of staff PPE, and extensive cleaning of exam room while observing appropriate contact time as indicated for disinfecting solutions.   [name] is a 49 year old female with a significant medical history including uncontrolled type 2 diabetes, CHF, Bipolar disorder, breast cancer with delayed wound healing, COPD, CKD, polyneuropathy, seizure disorder, CAD, cellulitis who presents today to discuss several issues.  1) Vaginal Itching: Acute for the last week to the labia majora. She's been using Monistat 7 for the last several days with temporary improvement. She denies vaginal discharge, hematuria, dysuria. She has noticed urinary frequency but is on diuretics. History of uncontrolled diabetes and glucose levels are in the 300-400 range.  2) Lower Extremity Ulcer: Chronic to left lower extremity for months. During her last visit in our office she was referred to wound care management. She has a hospice nurse who comes to the house every two weeks but she is not "allowed to change dressings". She had an appointment with the wound center two days ago, currently with a dressing to the left lower extremity and has a follow up visit scheduled for July 13 2020.   3) Brain Mass: Recent diagnosis per oncology per patient, no records of this in her chart. She was told that she has "two vegetable pea sized masses" to the brain that were cancerous. She was told that she needed a biopsy, but she has declined this option as she's tired of procedures and doctors visits. She has a family history of brain tumor in her brother who passed away in 11/14/2012.   CT scan of head in November 2021 negative.   4) Type 2 Diabetes: Currently following with  endocrinology, Dr. Honor Junes, with last (and only) visit being in May 2021. She is currently managed on regular insulin U-500 and is injecting 100 to 300 units twice daily depending on glucose readings. Also managed on dapaglifloizine-metformin 5-500 mg.   Last A1C 11.8 in May 2021, 11.1 today. She admits to being under a lot of stress. She endorses eating a healthy diet with baked lean meats, vegetables.   5) Cough: Increased and more frequent with more phlegm and congestion, increased dyspnea. This began about one week ago. She is compliant to her Symbicort 2 puffs BID and is using her albuterol once daily recently, and nightly use of Duoneb.  She denies fevers, body aches, loss of taste/smell. She has received Covid-19 vaccines as of December 2021.  BP Readings from Last 3 Encounters:  07/01/20 134/62  06/29/20 136/71  05/20/20 (!) 120/50   Wt Readings from Last 3 Encounters:  07/01/20 220 lb (99.8 kg)  05/20/20 274 lb 4 oz (124.4 kg)  05/15/20 259 lb (117.5 kg)     Review of Systems  Constitutional: Negative for chills and fever.  HENT: Positive for congestion.   Eyes: Negative for visual disturbance.  Respiratory: Positive for cough and shortness of breath.   Cardiovascular: Negative for chest pain.  Genitourinary: Positive for dysuria and frequency. Negative for hematuria, urgency and vaginal discharge.       Vaginal itching       Past Medical History:  Diagnosis Date  . Anxiety    takes Prozac daily  . Anxiety   .  Aortic valve calcification   . Asthma    Advair and Spirva daily  . Asthma   . Bipolar disorder ([place])   . CAD (coronary artery disease)    a. LHC 11/2013 done for CP/fluid retention: mild disease in prox LAD, mild-mod disease in mRCA, EF 60% with normal LVEDP. b. Normal nuc 03/2016.  Marland Kitchen Cancer ([place])   . CHF (congestive heart failure) ([place])   . Chronic diastolic CHF (congestive heart failure) ([place])   . Chronic heart failure with preserved ejection fraction  ([place]) 11/16/2018  . CKD (chronic kidney disease), stage II   . COPD (chronic obstructive pulmonary disease) ([facility])    a. nocturnal O2.  Marland Kitchen COPD (chronic obstructive pulmonary disease) ([place])   . Coronary artery disease   . Decreased urine stream   . Diabetes mellitus   . Diabetes mellitus without complication ([place])   . Dyspnea   . Family history of adverse reaction to anesthesia    mom gets nauseated  . GERD (gastroesophageal reflux disease)    takes Pepcid daily  . History of blood clots    left leg 3-22yrs ago  . Hyperlipidemia   . Hypertension   . Hypertriglyceridemia   . Inguinal hernia, left 01/2015  . Muscle spasm   . Open wound of genital labia   . Peripheral neuropathy   . RBBB   . Seizures ([place])   . Sepsis ([place]) 01/19/2018  . Sinus tachycardia    a. persistent since 2009.  Marland Kitchen Smokers' cough ([place])   . Stroke [facility]) 1989   left sided weakness  . TIA (transient ischemic attack)   . Tobacco abuse   . Vulvar abscess 01/23/2018     Social History   Socioeconomic History  . Marital status: Married    Spouse name: Not on file  . Number of children: 2  . Years of education: Not on file  . Highest education level: Not on file  Occupational History  . Not on file  Tobacco Use  . Smoking status: Current Some Day Smoker    Packs/day: 0.25    Years: 36.00    Pack years: 9.00    Types: Cigarettes    Start date: 07/11/1981  . Smokeless tobacco: Never Used  . Tobacco comment: 1 ppd now  Vaping Use  . Vaping Use: Some days  Substance and Sexual Activity  . Alcohol use: No  . Drug use: No  . Sexual activity: Not Currently  Other Topics Concern  . Not on file  Social History Narrative   ** Merged History Encounter **       Lives in [place]   Married but lives with Boyfriend - not legally separated   Disabled - for BiPolar, Seizure disorder, diabetes   Formerly worked at [employer] 1 ppd; no alcohol. 2 step sons; no biologic children.       Social  Determinants of Health   Financial Resource Strain: Not on file  Food Insecurity: Not on file  Transportation Needs: Not on file  Physical Activity: Not on file  Stress: Not on file  Social Connections: Not on file  Intimate Partner Violence: Not on file    Past Surgical History:  Procedure Laterality Date  . APPLICATION OF WOUND VAC Right 01/29/2020   Procedure: APPLICATION OF WOUND VAC;  Surgeon: Ronny Bacon, MD;  Location: [facility];  Service: General;  Laterality: Right;  . BREAST BIOPSY Right 11/06/2019   [place] core path pending  venus clip  . HERNIA REPAIR Left   . INCISION AND DRAINAGE ABSCESS N/A 01/26/2018   Procedure: INCISION AND DEBRIDEMENT OF VULVAR NECROTIZING SOFT TISSUE INFECTION;  Surgeon: Greer Pickerel, MD;  Location: [place];  Service: General;  Laterality: N/A;  . INCISION AND DRAINAGE PERIRECTAL ABSCESS N/A 01/22/2018   Procedure: IRRIGATION AND DEBRIDEMENT LABIAL/VULVAR AREA;  Surgeon: Coralie Keens, MD;  Location: [place];  Service: General;  Laterality: N/A;  . INCISION AND DRAINAGE PERIRECTAL ABSCESS N/A 01/29/2018   Procedure: IRRIGATION AND DEBRIDEMENT VULVA;  Surgeon: Excell Seltzer, MD;  Location: [place];  Service: General;  Laterality: N/A;  . INGUINAL HERNIA REPAIR Left 04/08/2015   Procedure: OPEN LEFT INGUINAL HERNIA REPAIR WITH MESH;  Surgeon: Ralene Ok, MD;  Location: [place];  Service: General;  Laterality: Left;  . INSERTION OF MESH Left 04/08/2015   Procedure: INSERTION OF MESH;  Surgeon: Ralene Ok, MD;  Location: [place];  Service: General;  Laterality: Left;  . LAPAROSCOPY     Endometriosis  . LEFT HEART CATHETERIZATION WITH CORONARY ANGIOGRAM N/A 12/05/2013   Procedure: LEFT HEART CATHETERIZATION WITH CORONARY ANGIOGRAM;  Surgeon: Jettie Booze, MD;  Location: [facility];  Service: Cardiovascular;  Laterality: N/A;  . right kidney drained    . SIMPLE MASTECTOMY WITH AXILLARY SENTINEL NODE BIOPSY Right 12/23/2019   Procedure: Right  SIMPLE MASTECTOMY WITH AXILLARY SENTINEL NODE BIOPSY;  Surgeon: Ronny Bacon, MD;  Location: [facility];  Service: General;  Laterality: Right;  . TEE WITHOUT CARDIOVERSION N/A 11/28/2013   Procedure: TRANSESOPHAGEAL ECHOCARDIOGRAM (TEE);  Surgeon: Thayer Headings, MD;  Location: [place];  Service: Cardiovascular;  Laterality: N/A;  . WOUND DEBRIDEMENT Right 01/29/2020   Procedure: DEBRIDEMENT WOUND, Excisional debridement skin, subcutaneous and muscle right chest wall;  Surgeon: Ronny Bacon, MD;  Location: [facility];  Service: General;  Laterality: Right;    Family History  Problem Relation Age of Onset  . Venous thrombosis Brother   . Other Brother        BRAIN TUMOR  . Asthma Father   . Diabetes Father   . Coronary artery disease Mother   . Hypertension Mother   . Diabetes Mother   . Breast cancer Mother 35  . Asthma Sister   . Diabetes type II Brother     Allergies  Allergen Reactions  . Adhesive [Tape] Rash and Other (See Comments)    TAKES OFF THE SKIN (CERTAIN MEDICAL TAPES DO THIS!!)  . Metoprolol Shortness Of Breath    Occurrence of shortness of breath after 3 days  . Montelukast Shortness Of Breath  . Morphine Sulfate Anaphylaxis, Shortness Of Breath and Nausea And Vomiting    Swollen Throat - Able to tolerate dilaudid  . Penicillins Anaphylaxis, Hives and Shortness Of Breath    Throat swells Has patient had a PCN reaction causing immediate rash, facial/tongue/throat swelling, SOB or lightheadedness with hypotension: Yes Has patient had a PCN reaction causing severe rash involving mucus membranes or skin necrosis: No Has patient had a PCN reaction that required hospitalization: Yes Has patient had a PCN reaction occurring within the last 10 years: No If all of the above answers are "NO", then may proceed with Cephalosporin use.   . Prednisone Anaphylaxis  . Diltiazem Swelling  . Gabapentin Swelling  . Midodrine     Lightheaded and falling down     Current Outpatient Medications on File Prior to Visit  Medication Sig Dispense Refill  . acetaminophen (TYLENOL) 500 MG tablet Take  1,000 mg by mouth every 6 (six) hours as needed for moderate pain.     Marland Kitchen albuterol (VENTOLIN HFA) 108 (90 Base) MCG/ACT inhaler Inhale 2 puffs into the lungs every 6 (six) hours as needed for wheezing or shortness of breath.    . ALPRAZolam (XANAX) 1 MG tablet Take 1 mg by mouth 4 (four) times daily as needed for anxiety.     Marland Kitchen anastrozole (ARIMIDEX) 1 MG tablet Take 1 tablet (1 mg total) by mouth daily. (Patient taking differently: Take 1 mg by mouth daily. Will start on 12/27) 60 tablet 2  . aspirin EC 81 MG tablet Take 81 mg by mouth daily before breakfast.     . budesonide-formoterol (SYMBICORT) 80-4.5 MCG/ACT inhaler INHALE 2 PUFFS BY MOUTH EVERY 12 HOURS TO PREVENT COUGH OR WHEEZING *RINSE MOUTH AFTER EACH USE* (Patient taking differently: Inhale 2 puffs into the lungs in the morning and at bedtime.) 10.2 g 3  . busPIRone (BUSPAR) 30 MG tablet Take 30 mg by mouth 2 (two) times daily.    . citalopram (CELEXA) 20 MG tablet Take 1 tablet (20 mg total) by mouth daily. 30 tablet 2  . Dapagliflozin-metFORMIN HCl ER 5-500 MG TB24 Take 1 tablet by mouth daily with breakfast.    . desvenlafaxine (PRISTIQ) 100 MG 24 hr tablet Take 100 mg by mouth daily.    . famotidine (PEPCID) 20 MG tablet TAKE 1 TABLET (20 MG TOTAL) BY MOUTH 2 (TWO) TIMES DAILY. FOR HEARTBURN. (Patient taking differently: Take 20 mg by mouth 2 (two) times daily.) 180 tablet 1  . Insulin Pen Needle 32G X 4 MM MISC Used to give insulin injections twice daily. 100 each 11  . insulin regular human CONCENTRATED (HUMULIN R) 500 UNIT/ML injection Inject 30 Units into the skin 3 (three) times daily with meals.     . Insulin Syringe-Needle U-100 (GLOBAL INJECT EASE INSULIN SYR) 30G X 1/2" 1 ML MISC See admin instructions.    Marland Kitchen ipratropium-albuterol (DUONEB) 0.5-2.5 (3) MG/3ML SOLN Take 3 mLs by nebulization  every 4 (four) hours as needed (wheezing/shortness of breath). 360 mL 0  . metolazone (ZAROXOLYN) 5 MG tablet Take 5 mg by mouth daily.     Marland Kitchen nystatin-triamcinolone ointment (MYCOLOG) Apply 1 application topically 2 (two) times daily. 30 g 1  . ondansetron (ZOFRAN) 4 MG tablet Take 4 mg by mouth 2 (two) times daily.    Glory Rosebush Delica Lancets 51V MISC 1 each by Does not apply route 3 (three) times daily. Use to monitor glucose levels three times daily; E11.9; E10.42 100 each 11  . ONETOUCH VERIO test strip USE AS DIRECTED TO CHECK BLOOD SUGAR 3 TIMES DAILY 100 each 0  . OXYGEN Inhale 2-4 L into the lungs 2 (two) times daily as needed (shortness of breath).     . pregabalin (LYRICA) 300 MG capsule Take 1 capsule (300 mg total) by mouth 2 (two) times daily. For nerve pain. 180 capsule 0  . rOPINIRole (REQUIP) 1 MG tablet TAKE 1 TABLET BY MOUTH EVERY NIGHT AT BEDTIME FOR RESTLESS LEGS. 90 tablet 1  . rosuvastatin (CRESTOR) 20 MG tablet Take 1 tablet (20 mg total) by mouth daily. 90 tablet 0  . spironolactone (ALDACTONE) 50 MG tablet TAKE 1 TABLET BY MOUTH ONCE A DAY 90 tablet 1  . tamoxifen (NOLVADEX) 20 MG tablet Take 1 tablet (20 mg total) by mouth daily. 90 tablet 1  . torsemide (DEMADEX) 20 MG tablet TAKE 1 TABLET BY MOUTH 4  TIMES DAILY (Patient taking differently: Take 20 mg by mouth 2 (two) times daily.) 360 tablet 3  . traZODone (DESYREL) 100 MG tablet Take 200 mg by mouth at bedtime.     Marland Kitchen ULTICARE MINI PEN NEEDLES 31G X 6 MM MISC USE AS DIRECTED 3 TIMES DAILY 100 each 0   No current facility-administered medications on file prior to visit.    BP 134/62   Pulse (!) 54   Temp (!) 96.6 F (35.9 C) (Temporal)   Ht 5\' 4"  (1.626 m)   Wt 220 lb (99.8 kg)   LMP 04/01/2020   SpO2 98%   BMI 37.76 kg/m    Objective:   Physical Exam Constitutional:      General: She is not in acute distress.    Appearance: She is well-nourished. She is not ill-appearing.  Cardiovascular:     Rate and  Rhythm: Normal rate and regular rhythm.  Pulmonary:     Effort: Pulmonary effort is normal.     Breath sounds: Normal breath sounds.     Comments: Lungs actually sound clear throughout.  Musculoskeletal:     Cervical back: Neck supple.  Skin:    General: Skin is warm and dry.     Comments: Erythema without puss or crusting to right mastectomy site. Appears about the same as it did on 12/20 except for crusting has been removed.  Left lower extremity is wrapped with dressing for ulcer.  Neurological:     Mental Status: She is alert.  Psychiatric:        Mood and Affect: Mood and affect normal.            Assessment & Plan:

## 2020-07-01 NOTE — Assessment & Plan Note (Signed)
Located to bilateral lungs according to CT chest in November 2021. Given her history of cancer and continued tobacco abuse we will need to set her up for ongoing monitoring.  Will refer to lung nodule screening program.

## 2020-07-01 NOTE — Progress Notes (Addendum)
RHONDA, PRAT ([id number]) Visit Report for 06/29/2020 Chief Complaint Document Details Patient Name: [name], [name] Date of Service: 06/29/2020 9:15 AM Medical Record Number: [id number] Patient Account Number: [id number] Date of Birth/Sex: 31-Jul-1970 (49 y.o. F) Treating RN: Cornell Barman Primary Care Provider: Alma Friendly Other Clinician: Referring Provider: Alma Friendly Treating Provider/Extender: Skipper Cliche in Treatment: 0 Information Obtained from: Patient Chief Complaint Left LE Ulcers Electronic Signature(s) Signed: 06/29/2020 10:14:38 AM By: Worthy Keeler PA-C Entered By: Worthy Keeler on 06/29/2020 10:14:38 [place], [name] ([id number]) -------------------------------------------------------------------------------- Debridement Details Patient Name: [name] Date of Service: 06/29/2020 9:15 AM Medical Record Number: [id number] Patient Account Number: [id number] Date of Birth/Sex: March 15, 1971 (49 y.o. F) Treating RN: Cornell Barman Primary Care Provider: Alma Friendly Other Clinician: Referring Provider: Alma Friendly Treating Provider/Extender: Skipper Cliche in Treatment: 0 Debridement Performed for Wound #7 Left,Proximal,Midline,Anterior Lower Leg Assessment: Performed By: Physician Tommie Sams., PA-C Debridement Type: Chemical/Enzymatic/Mechanical Agent Used: saline and gauze Severity of Tissue Pre Debridement: Fat layer exposed Level of Consciousness (Pre- Awake and Alert procedure): Pre-procedure Verification/Time Out Yes - 09:40 Taken: Instrument: Other : saline and gauze Bleeding: Minimum Hemostasis Achieved: Pressure Response to Treatment: Procedure was tolerated well Level of Consciousness (Post- Awake and Alert procedure): Post Debridement Measurements of Total Wound Length: (cm) 1 Width: (cm) 1.5 Depth: (cm) 0.1 Volume: (cm) 0.118 Character of Wound/Ulcer Post Debridement: Improved Severity of Tissue Post Debridement:  Fat layer exposed Post Procedure Diagnosis Same as Pre-procedure Electronic Signature(s) Signed: 07/01/2020 4:21:17 PM By: Gretta Cool, BSN, RN, CWS, Kim RN, BSN Signed: 07/02/2020 4:46:48 PM By: Worthy Keeler PA-C Previous Signature: 07/01/2020 4:19:13 PM Version By: Gretta Cool, BSN, RN, CWS, Kim RN, BSN Entered By: Gretta Cool, BSN, RN, CWS, Kim on 07/01/2020 16:21:17 [place], [name] ([id number]) -------------------------------------------------------------------------------- Debridement Details Patient Name: [name] Date of Service: 06/29/2020 9:15 AM Medical Record Number: [id number] Patient Account Number: [id number] Date of Birth/Sex: [birth year] (49 y.o. F) Treating RN: Cornell Barman Primary Care Provider: Alma Friendly Other Clinician: Referring Provider: Alma Friendly Treating Provider/Extender: Skipper Cliche in Treatment: 0 Debridement Performed for Wound #8 Left,Distal,Midline,Anterior Lower Leg Assessment: Performed By: Physician Tommie Sams., PA-C Debridement Type: Chemical/Enzymatic/Mechanical Agent Used: saline and gauze Severity of Tissue Pre Debridement: Fat layer exposed Level of Consciousness (Pre- Awake and Alert procedure): Pre-procedure Verification/Time Out Yes - 09:40 Taken: Instrument: Other : saline and gauze Bleeding: Minimum Hemostasis Achieved: Pressure Response to Treatment: Procedure was tolerated well Level of Consciousness (Post- Awake and Alert procedure): Post Debridement Measurements of Total Wound Length: (cm) 3.4 Width: (cm) 2.2 Depth: (cm) 0.1 Volume: (cm) 0.587 Character of Wound/Ulcer Post Debridement: Improved Severity of Tissue Post Debridement: Fat layer exposed Post Procedure Diagnosis Same as Pre-procedure Electronic Signature(s) Signed: 07/01/2020 4:21:51 PM By: Gretta Cool, BSN, RN, CWS, Kim RN, BSN Signed: 07/02/2020 4:46:48 PM By: Worthy Keeler PA-C Entered By: Gretta Cool BSN, RN, CWS, Kim on 07/01/2020 16:21:50 [place],  [name] ([id number]) -------------------------------------------------------------------------------- HPI Details Patient Name: [name] Date of Service: 06/29/2020 9:15 AM Medical Record Number: [id number] Patient Account Number: [id number] Date of Birth/Sex: 22-May-1971 (49 y.o. F) Treating RN: Cornell Barman Primary Care Provider: Alma Friendly Other Clinician: Referring Provider: Alma Friendly Treating Provider/Extender: Skipper Cliche in Treatment: 0 History of Present Illness HPI Description: ADMISSION 06/19/2019 Patient is a 49 year old woman who is accompanied by her husband. She has severe diabetic peripheral neuropathy which is left her minimally ambulatory. She spends most of the time in a wheelchair. She also has a history of chronic  lower extremity edema history of left leg DVT. She states that on 2 separate occasions roughly a month ago she fell with lacerations on the bilateral anterior tibial areas. She was seen by primary care on 06/04/2019 diagnosed with cellulitis of the left leg put on Bactrim DS. Also noted to have hand wounds with a question of referral to hand surgery. She was also sent to our clinic at the same time. The patient's husband is doing the dressings. He has been using silver alginate that was supplied by home health. He is done a really good job the area on the left leg is actually healed only a small superficial area on the right Somewhere in the in the last 2 weeks she gives a bizarre history of going to wash nail polish off the fingers of both hands. She apparently did not realize that her son had put Clorox in the vinegar jar that they used to clean the countertop. She ended up burning her hands. The patient has a wound on the PIP of the left third finger, PIP of the right second finger and the lateral part of the right third finger Past medical history includes COPD, obstructive sleep apnea on nocturnal O2, type 2 diabetes with peripheral  neuropathy, retinopathy, chronic diastolic heart failure coronary artery disease, severe diabetic neuropathy type 2 diabetes apparently diagnosed at age 38 on insulin, COPD, continued tobacco abuse, left leg deep DVT, hypertension, TIAs, seizure disorders, asthma The patient is actually had arterial studies in February of this year. ABIs bilaterally were 1.02 and TBI's bilaterally at 0.6. Waveforms were triphasic and biphasic. She was not felt to have significant arterial disease. 12/23; patient readmitted the clinic last week. She has predominantly venous insufficiency wounds on the right medial lower leg. She also had open areas on the fingers of both hands which happened in a very bizarre way. She has almost complete healing on the right medial calf. The area on the left third finger is healed. She still has 2 open wounds on the medial aspect of the PIP of the right second finger and the dorsal aspect of the right PIP of the third finger. Of these 2 wounds the PIP of the third finger wound is painful and deeper 12/30; the areas on her right medial calf are closed. She likely has some degree of chronic venous insufficiency and lymphedema. Also very xerotic skin which I have recommended a moisturizing. I did not bring up the issue of compression stockings The areas we are still looking out are on the PIP of the right third finger and the medial part of the PIP of the right second finger. The second finger area is just about closed. The problem is over the PIP of the right third finger. This does not look infected and she is completing her antibiotics from last week. However this is a deep wound at the base of this there is either tendon or periosteum. She she has some healthy granulation but we are not seeing that close over the bottom part of the wound. We are probably going to need to immobilize the finger somewhat so that she does not continuously pull the tissue apart 07/17/2019; right medial  calf remains closed and the right second finger is closed. The sole problem here is an area over the PIP of the third finger. These wounds were initially advertised as burn injuries that happened in a very bizarre way [please see my initial discussion on this]. I gave her doxycycline last  week because of some erythema around the wound however she did not tolerate this very well because of nausea. She stopped this within the last day or 2. we have been using silver collagen 1/13; the only wound we are looking at is the right third finger over the PIP. We are using endoform. X-ray ordered last time showed no underlying bone abnormalities 1/20; right third finger over the PIP. Still debris at the surface we have been using endoform. We are making some improvements in surface area and volume but still requiring debridement. 1/27; right third finger over the PIP. Still debris on the surface of the wound and maceration. We have been using endoform. This wound initially went to the periosteum and it certainly is off that but is certainly stalled over the last 2 or 3 weeks 2/10; right third finger over the PIP. We switch to Iodoflex last time but she says this caused extreme erythema and blistering and they changed back to endoform. This is a wound that originally went to the periosteum. This is improved since her admission but really it is stalled. She missed her appointment last week but she states she has been using endoform. She complains of increasing pain in the finger that is making it difficult for her to sleep at times 2/17; right third finger over the PIP. This is a difficult wound small punched out area. Culture I did last week showed methicillin sensitive staph aureus however she is allergic to penicillin [widespread rash as a child] she has not had cephalosporins. Multiple drug interactions with quinolones and I was concerned about trimethoprim sulfamethoxazole in somebody with ACE inhibitors  and spironolactone. I prescribed doxycycline. This was 2 days ago. She comes in today without the antibiotic saying that it caused mental status changes fatigue and diarrhea the last time she was on this. 2/24; right third finger over the PIP. She is tolerating the doxycycline has another 3 days left. This is for the area on the PIP of the third finger on her right. She arrives in clinic today with a thick raised eschar over the PIP of the left third finger. I talked to her about this. The exact reason is unclear. She does smoke but denies it could be a burn injury. Her husband thinks this may be trauma pushing her wheelchair but the depth of this makes this somewhat unusual. She does not appear to have any blood flow issues in either arm Readmission: 06/29/2020 upon evaluation today patient appears to be doing somewhat poorly in regard to her left lower extremity. She has a couple open areas here which appear to be venous in nature. One of them is a more recent trauma but nonetheless both are very superficial. She does have a lot of lymphedema/stasis type weeping from the wound opening which I think is preventing her from healing. She has been trying to work on this SHAKIAH, BRINLEE ([id number]) on her own but unfortunately just is not getting the results she needs as far as getting the swelling under control to allow these wounds to heal. Currently the patient tells me as well that she has been undergoing treatment for breast cancer on the right. I think she has a left mastectomy as well. She also has COPD. Her husband is actually taking very good care of her and doing what he can at this point in time. Patient does have hospice coming out to see her as well. Electronic Signature(s) Signed: 06/29/2020 4:12:46 PM By: Worthy Keeler PA-C  Entered By: Lenda Kelp on 06/29/2020 16:12:46 Einstein, Tora Duck  ([id number]) -------------------------------------------------------------------------------- Physical Exam Details Patient Name: DIOSELINA, CREE Date of Service: 06/29/2020 9:15 AM Medical Record Number: [id number] Patient Account Number: [id number] Date of Birth/Sex: 06-Jan-1971 (49 y.o. F) Treating RN: Huel Coventry Primary Care Provider: Vernona Rieger Other Clinician: Referring Provider: Vernona Rieger Treating Provider/Extender: Rowan Blase in Treatment: 0 Constitutional sitting or standing blood pressure is within target range for patient.. pulse regular and within target range for patient.Marland Kitchen respirations regular, non- labored and within target range for patient.Marland Kitchen temperature within target range for patient.. Well-nourished and well-hydrated in no acute distress. Eyes conjunctiva clear no eyelid edema noted. pupils equal round and reactive to light and accommodation. Ears, Nose, Mouth, and Throat no gross abnormality of ear auricles or external auditory canals. normal hearing noted during conversation. mucus membranes moist. Respiratory normal breathing without difficulty As long as she is on her oxygen and she was on that during the office visit today.. Cardiovascular 2+ dorsalis pedis/posterior tibialis pulses. 2+ pitting edema of the bilateral lower extremities. Musculoskeletal normal gait and posture. no significant deformity or arthritic changes, no loss or range of motion, no clubbing. Psychiatric this patient is able to make decisions and demonstrates good insight into disease process. Alert and Oriented x 3. pleasant and cooperative. Notes Upon inspection patient's wound bed actually showed signs of good granulation at this time. There does not appear to be any evidence of active infection she had minimal slough noted that I was able to clear away with saline and gauze no sharp debridement was necessary. Post mechanical debridement she is doing much better which is great  news. Her blood pressure is doing well and all other vital signs are good as well. I think the edema and swelling here is secondary to the lymphedema and venous stasis more than anything to be honest. Electronic Signature(s) Signed: 06/29/2020 4:13:59 PM By: Lenda Kelp PA-C Entered By: Lenda Kelp on 06/29/2020 16:13:59 [place], Tora Duck ([id number]) -------------------------------------------------------------------------------- Physician Orders Details Patient Name: Allen Kell Date of Service: 06/29/2020 9:15 AM Medical Record Number: [id number] Patient Account Number: [id number] Date of Birth/Sex: 12/04/1970 (49 y.o. F) Treating RN: Huel Coventry Primary Care Provider: Vernona Rieger Other Clinician: Referring Provider: Vernona Rieger Treating Provider/Extender: Rowan Blase in Treatment: 0 Verbal / Phone Orders: No Diagnosis Coding ICD-10 Coding Code Description I87.2 Venous insufficiency (chronic) (peripheral) L97.822 Non-pressure chronic ulcer of other part of left lower leg with fat layer exposed D05.81 Other specified type of carcinoma in situ of right breast J44.9 Chronic obstructive pulmonary disease, unspecified Wound Cleansing Wound #7 Left,Proximal,Midline,Anterior Lower Leg o Clean wound with Normal Saline. o Antibacterial soap, wash wounds, rinse and pat dry prior to dressing wounds Wound #8 Left,Distal,Midline,Anterior Lower Leg o Clean wound with Normal Saline. o Antibacterial soap, wash wounds, rinse and pat dry prior to dressing wounds Anesthetic (add to Medication List) Wound #7 Left,Proximal,Midline,Anterior Lower Leg o Topical Lidocaine 4% cream applied to wound bed prior to debridement (In Clinic Only). Wound #8 Left,Distal,Midline,Anterior Lower Leg o Topical Lidocaine 4% cream applied to wound bed prior to debridement (In Clinic Only). Skin Barriers/Peri-Wound Care Wound #7 Left,Proximal,Midline,Anterior Lower Leg o  Moisturizing lotion Wound #8 Left,Distal,Midline,Anterior Lower Leg o Moisturizing lotion Primary Wound Dressing Wound #7 Left,Proximal,Midline,Anterior Lower Leg o Silver Alginate Wound #8 Left,Distal,Midline,Anterior Lower Leg o Silver Alginate Secondary Dressing Wound #7 Left,Proximal,Midline,Anterior Lower Leg o ABD and Kerlix/Conform Wound #8 Left,Distal,Midline,Anterior Lower Leg o ABD and Kerlix/Conform Dressing Change Frequency  Wound #7 Left,Proximal,Midline,Anterior Lower Leg o Change Dressing Monday, Wednesday, Friday Wound #8 Left,Distal,Midline,Anterior Lower Leg o Change Dressing Monday, Wednesday, Friday Follow-up Appointments [place], [facility] ([id number]) Wound #7 Left,Proximal,Midline,Anterior Lower Leg o Return Appointment in 2 weeks. Wound #8 Left,Distal,Midline,Anterior Lower Leg o Return Appointment in 2 weeks. Edema Control Wound #7 Left,Proximal,Midline,Anterior Lower Leg o Other: - Tubi-grip F Wound #8 Left,Distal,Midline,Anterior Lower Leg o Other: - [place] Wound #7 Left,Proximal,Midline,Anterior Lower Leg o [place] Visits - Hospice Nurse-please order dressing supplies for patient. o Home Health Nurse may visit PRN to address patientos wound care needs. o *FACE TO FACE ENCOUNTER: MEDICARE and MEDICAID PATIENTS: I certify that this patient is under my care and that I had a face-to-face encounter that meets the physician face-to-face encounter requirements with this patient on this date. The encounter with the patient was in whole or in part for the following MEDICAL CONDITION: (primary reason for [place]) MEDICAL NECESSITY: I certify, that based on my findings, NURSING services are a medically necessary home health service. *HOME BOUND STATUS: I certify that my clinical findings support that this patient is homebound (i.e., Due to illness or injury, pt requires aid of supportive devices such as  crutches, cane, wheelchairs, walkers, the use of special transportation or the assistance of another person to leave their place of residence. There is a normal inability to leave the home and doing so requires considerable and taxing effort. Other absences are for medical reasons / religious services and are infrequent or of short duration when for other reasons). *Please direct any NON-WOUND related issues/requests for orders to patient's Primary Care Physician. *If current dressing causes regression in wound condition, may D/C ordered dressing product/s and apply Normal Saline Moist Dressing daily until next [place] / Other MD appointment. [place] of regression in wound condition at [phone]. o Please direct any NON-WOUND related issues/requests for orders to patient's Primary Care Physician o Skin Substitute applied. Do not remove dressing below the steristrips. Call office with any questions [phone]. Wound #8 Left,Distal,Midline,Anterior Lower Leg o Continue Home Health Visits - Hospice Nurse-please order dressing supplies for patient. o Home Health Nurse may visit PRN to address patientos wound care needs. o *FACE TO FACE ENCOUNTER: MEDICARE and MEDICAID PATIENTS: I certify that this patient is under my care and that I had a face-to-face encounter that meets the physician face-to-face encounter requirements with this patient on this date. The encounter with the patient was in whole or in part for the following MEDICAL CONDITION: (primary reason for [place]) MEDICAL NECESSITY: I certify, that based on my findings, NURSING services are a medically necessary home health service. *HOME BOUND STATUS: I certify that my clinical findings support that this patient is homebound (i.e., Due to illness or injury, pt requires aid of supportive devices such as crutches, cane, wheelchairs, walkers, the use of special transportation or the  assistance of another person to leave their place of residence. There is a normal inability to leave the home and doing so requires considerable and taxing effort. Other absences are for medical reasons / religious services and are infrequent or of short duration when for other reasons). *Please direct any NON-WOUND related issues/requests for orders to patient's Primary Care Physician. *If current dressing causes regression in wound condition, may D/C ordered dressing product/s and apply Normal Saline Moist Dressing daily until next [place] / Other MD appointment. [place] of regression in wound condition  at [phone]. o Please direct any NON-WOUND related issues/requests for orders to patient's Primary Care Physician o Skin Substitute applied. Do not remove dressing below the steristrips. Call office with any questions [phone]. Electronic Signature(s) Signed: 06/29/2020 4:39:05 PM By: Gretta Cool, BSN, RN, CWS, Kim RN, BSN Signed: 06/30/2020 5:24:08 PM By: Worthy Keeler PA-C Entered By: Gretta Cool BSN, RN, CWS, Kim on 06/29/2020 10:17:54 ANYELI, FIETZ ([id number]) -------------------------------------------------------------------------------- Problem List Details Patient Name: [name] Date of Service: 06/29/2020 9:15 AM Medical Record Number: [id number] Patient Account Number: [id number] Date of Birth/Sex: [birth year] (49 y.o. F) Treating RN: Cornell Barman Primary Care Provider: Alma Friendly Other Clinician: Referring Provider: Alma Friendly Treating Provider/Extender: Skipper Cliche in Treatment: 0 Active Problems ICD-10 Encounter Code Description Active Date MDM Diagnosis I87.2 Venous insufficiency (chronic) (peripheral) 06/29/2020 No Yes L97.822 Non-pressure chronic ulcer of other part of left lower leg with fat layer 06/29/2020 No Yes exposed D05.81 Other specified type of carcinoma in situ of right breast 06/29/2020 No Yes J44.9  Chronic obstructive pulmonary disease, unspecified 06/29/2020 No Yes Inactive Problems Resolved Problems Electronic Signature(s) Signed: 06/29/2020 10:04:08 AM By: Worthy Keeler PA-C Entered By: Worthy Keeler on 06/29/2020 10:04:07 [place], [name] ([id number]) -------------------------------------------------------------------------------- Progress Note Details Patient Name: [name] Date of Service: 06/29/2020 9:15 AM Medical Record Number: [id number] Patient Account Number: [id number] Date of Birth/Sex: September 04, 1970 (49 y.o. F) Treating RN: Cornell Barman Primary Care Provider: Alma Friendly Other Clinician: Referring Provider: Alma Friendly Treating Provider/Extender: Skipper Cliche in Treatment: 0 Subjective Chief Complaint Information obtained from Patient Left LE Ulcers History of Present Illness (HPI) ADMISSION 06/19/2019 Patient is a 49 year old woman who is accompanied by her husband. She has severe diabetic peripheral neuropathy which is left her minimally ambulatory. She spends most of the time in a wheelchair. She also has a history of chronic lower extremity edema history of left leg DVT. She states that on 2 separate occasions roughly a month ago she fell with lacerations on the bilateral anterior tibial areas. She was seen by primary care on 06/04/2019 diagnosed with cellulitis of the left leg put on Bactrim DS. Also noted to have hand wounds with a question of referral to hand surgery. She was also sent to our clinic at the same time. The patient's husband is doing the dressings. He has been using silver alginate that was supplied by home health. He is done a really good job the area on the left leg is actually healed only a small superficial area on the right Somewhere in the in the last 2 weeks she gives a bizarre history of going to wash nail polish off the fingers of both hands. She apparently did not realize that her son had put Clorox in the vinegar jar  that they used to clean the countertop. She ended up burning her hands. The patient has a wound on the PIP of the left third finger, PIP of the right second finger and the lateral part of the right third finger Past medical history includes COPD, obstructive sleep apnea on nocturnal O2, type 2 diabetes with peripheral neuropathy, retinopathy, chronic diastolic heart failure coronary artery disease, severe diabetic neuropathy type 2 diabetes apparently diagnosed at age 50 on insulin, COPD, continued tobacco abuse, left leg deep DVT, hypertension, TIAs, seizure disorders, asthma The patient is actually had arterial studies in February of this year. ABIs bilaterally were 1.02 and TBI's bilaterally at 0.6. Waveforms were triphasic and biphasic. She was not felt to have significant arterial disease. 12/23;  patient readmitted the clinic last week. She has predominantly venous insufficiency wounds on the right medial lower leg. She also had open areas on the fingers of both hands which happened in a very bizarre way. She has almost complete healing on the right medial calf. The area on the left third finger is healed. She still has 2 open wounds on the medial aspect of the PIP of the right second finger and the dorsal aspect of the right PIP of the third finger. Of these 2 wounds the PIP of the third finger wound is painful and deeper 12/30; the areas on her right medial calf are closed. She likely has some degree of chronic venous insufficiency and lymphedema. Also very xerotic skin which I have recommended a moisturizing. I did not bring up the issue of compression stockings The areas we are still looking out are on the PIP of the right third finger and the medial part of the PIP of the right second finger. The second finger area is just about closed. The problem is over the PIP of the right third finger. This does not look infected and she is completing her antibiotics from last week. However this is a  deep wound at the base of this there is either tendon or periosteum. She she has some healthy granulation but we are not seeing that close over the bottom part of the wound. We are probably going to need to immobilize the finger somewhat so that she does not continuously pull the tissue apart 07/17/2019; right medial calf remains closed and the right second finger is closed. The sole problem here is an area over the PIP of the third finger. These wounds were initially advertised as burn injuries that happened in a very bizarre way [please see my initial discussion on this]. I gave her doxycycline last week because of some erythema around the wound however she did not tolerate this very well because of nausea. She stopped this within the last day or 2. we have been using silver collagen 1/13; the only wound we are looking at is the right third finger over the PIP. We are using endoform. X-ray ordered last time showed no underlying bone abnormalities 1/20; right third finger over the PIP. Still debris at the surface we have been using endoform. We are making some improvements in surface area and volume but still requiring debridement. 1/27; right third finger over the PIP. Still debris on the surface of the wound and maceration. We have been using endoform. This wound initially went to the periosteum and it certainly is off that but is certainly stalled over the last 2 or 3 weeks 2/10; right third finger over the PIP. We switch to Iodoflex last time but she says this caused extreme erythema and blistering and they changed back to endoform. This is a wound that originally went to the periosteum. This is improved since her admission but really it is stalled. She missed her appointment last week but she states she has been using endoform. She complains of increasing pain in the finger that is making it difficult for her to sleep at times 2/17; right third finger over the PIP. This is a difficult wound  small punched out area. Culture I did last week showed methicillin sensitive staph aureus however she is allergic to penicillin [widespread rash as a child] she has not had cephalosporins. Multiple drug interactions with quinolones and I was concerned about trimethoprim sulfamethoxazole in somebody with ACE inhibitors and spironolactone. I  prescribed doxycycline. This was 2 days ago. She comes in today without the antibiotic saying that it caused mental status changes fatigue and diarrhea the last time she was on this. 2/24; right third finger over the PIP. She is tolerating the doxycycline has another 3 days left. This is for the area on the PIP of the third finger on her right. She arrives in clinic today with a thick raised eschar over the PIP of the left third finger. I talked to her about this. The exact reason is unclear. She does smoke but denies it could be a burn injury. Her husband thinks this may be trauma pushing her wheelchair but the depth of this makes this somewhat unusual. She does not appear to have any blood flow issues in either arm Lighty, Shanecia ([id number]) Readmission: 06/29/2020 upon evaluation today patient appears to be doing somewhat poorly in regard to her left lower extremity. She has a couple open areas here which appear to be venous in nature. One of them is a more recent trauma but nonetheless both are very superficial. She does have a lot of lymphedema/stasis type weeping from the wound opening which I think is preventing her from healing. She has been trying to work on this on her own but unfortunately just is not getting the results she needs as far as getting the swelling under control to allow these wounds to heal. Currently the patient tells me as well that she has been undergoing treatment for breast cancer on the right. I think she has a left mastectomy as well. She also has COPD. Her husband is actually taking very good care of her and doing what he can at  this point in time. Patient does have hospice coming out to see her as well. Patient History Information obtained from Patient. Allergies penicillin (Severity: Moderate, Reaction: rash), Singulair (Severity: Severe, Reaction: tongue swelling), metoprolol, morphine (Severity: Severe, Reaction: face swelling, unable to swallow), prednisone (Severity: Severe, Reaction: throat swelling), diltiazem Family History Cancer - Siblings, Diabetes - Siblings,Father,Mother, Heart Disease - Siblings,Father,Mother, Hypertension - Mother,Father,Siblings, Lung Disease - Mother, Seizures - Mother, Stroke - Mother,Siblings,Father, No family history of Hereditary Spherocytosis, Kidney Disease, Thyroid Problems, Tuberculosis. Social History Current every day smoker - 40 years, Marital Status - Married, Alcohol Use - Never, Drug Use - No History, Caffeine Use - Daily - coffee, soda. Medical History Eyes Denies history of Cataracts, Glaucoma, Optic Neuritis Ear/Nose/Mouth/Throat Denies history of Chronic sinus problems/congestion, Middle ear problems Hematologic/Lymphatic Denies history of Anemia, Hemophilia, Human Immunodeficiency Virus, Lymphedema, Sickle Cell Disease Respiratory Patient has history of Asthma, Chronic Obstructive Pulmonary Disease (COPD), Sleep Apnea Cardiovascular Patient has history of Arrhythmia - sinus tachycardia, Congestive Heart Failure, Coronary Artery Disease, Hypertension Gastrointestinal Denies history of Cirrhosis , Colitis, Crohn s, Hepatitis A, Hepatitis B, Hepatitis C Endocrine Patient has history of Type II Diabetes Genitourinary Denies history of End Stage Renal Disease Immunological Denies history of Lupus Erythematosus, Raynaud s, Scleroderma Integumentary (Skin) Denies history of History of Burn, History of pressure wounds Musculoskeletal Denies history of Gout, Rheumatoid Arthritis, Osteoarthritis, Osteomyelitis Neurologic Patient has history of Neuropathy -  legs, feet and hands Denies history of Dementia, Quadriplegia, Paraplegia, Seizure Disorder Oncologic Patient has history of Received Radiation Denies history of Received Chemotherapy Psychiatric Denies history of Anorexia/bulimia, Confinement Anxiety Patient is treated with Insulin, Oral Agents. Blood sugar is tested. Blood sugar results noted at the following times: Breakfast - 225. Medical And Surgical History Notes Neurologic Stroke 2020 Left weakness Oncologic  Breast cancer-right mastectomy Review of Systems (ROS) Eyes Denies complaints or symptoms of Dry Eyes, Vision Changes, Glasses / Contacts. Ear/Nose/Mouth/Throat Denies complaints or symptoms of Difficult clearing ears, Sinusitis. Hematologic/Lymphatic Denies complaints or symptoms of Bleeding / Clotting Disorders, Human Immunodeficiency Virus. Gastrointestinal Denies complaints or symptoms of Frequent diarrhea, Nausea, Vomiting. Endocrine Denies complaints or symptoms of Hepatitis, Thyroid disease, Polydypsia (Excessive Thirst). Genitourinary Denies complaints or symptoms of Kidney failure/ Dialysis, Incontinence/dribbling. JAMEKA, GORNTO ([id number]) Immunological Denies complaints or symptoms of Hives, Itching. Integumentary (Skin) Complains or has symptoms of Wounds, Bleeding or bruising tendency. Musculoskeletal Denies complaints or symptoms of Muscle Pain, Muscle Weakness. Neurologic Denies complaints or symptoms of Numbness/parasthesias, Focal/Weakness. Psychiatric Complains or has symptoms of Anxiety. Denies complaints or symptoms of Claustrophobia. Objective Constitutional sitting or standing blood pressure is within target range for patient.. pulse regular and within target range for patient.Marland Kitchen respirations regular, non- labored and within target range for patient.Marland Kitchen temperature within target range for patient.. Well-nourished and well-hydrated in no acute distress. Vitals Time Taken: 9:25 AM, Height: 64 in,  Source: Stated, Weight: 260 lbs, Source: Stated, BMI: 44.6, Temperature: 97.9 F, Pulse: 93 bpm, Respiratory Rate: 18 breaths/min, Blood Pressure: 131/76 mmHg. Eyes conjunctiva clear no eyelid edema noted. pupils equal round and reactive to light and accommodation. Ears, Nose, Mouth, and Throat no gross abnormality of ear auricles or external auditory canals. normal hearing noted during conversation. mucus membranes moist. Respiratory normal breathing without difficulty As long as she is on her oxygen and she was on that during the office visit today.. Cardiovascular 2+ dorsalis pedis/posterior tibialis pulses. 2+ pitting edema of the bilateral lower extremities. Musculoskeletal normal gait and posture. no significant deformity or arthritic changes, no loss or range of motion, no clubbing. Psychiatric this patient is able to make decisions and demonstrates good insight into disease process. Alert and Oriented x 3. pleasant and cooperative. General Notes: Upon inspection patient's wound bed actually showed signs of good granulation at this time. There does not appear to be any evidence of active infection she had minimal slough noted that I was able to clear away with saline and gauze no sharp debridement was necessary. Post mechanical debridement she is doing much better which is great news. Her blood pressure is doing well and all other vital signs are good as well. I think the edema and swelling here is secondary to the lymphedema and venous stasis more than anything to be honest. Integumentary (Hair, Skin) Wound #7 status is Open. Original cause of wound was Trauma. The wound is located on the Left,Proximal,Midline,Anterior Lower Leg. The wound measures 1cm length x 1.5cm width x 0.1cm depth; 1.178cm^2 area and 0.118cm^3 volume. There is Fat Layer (Subcutaneous Tissue) exposed. There is no tunneling or undermining noted. There is a large amount of serous drainage noted. The wound margin is  indistinct and nonvisible. There is large (67-100%) red granulation within the wound bed. There is no necrotic tissue within the wound bed. Wound #8 status is Open. Original cause of wound was Gradually Appeared. The wound is located on the Left,Distal,Midline,Anterior Lower Leg. The wound measures 3.4cm length x 2.2cm width x 0.1cm depth; 5.875cm^2 area and 0.587cm^3 volume. There is Fat Layer (Subcutaneous Tissue) exposed. There is no tunneling or undermining noted. There is a large amount of serous drainage noted. The wound margin is indistinct and nonvisible. There is medium (34-66%) red granulation within the wound bed. There is a medium (34-66%) amount of necrotic tissue within the wound bed including Adherent  Slough. 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 Antibacterial soap, wash wounds, rinse and pat dry prior to dressing wounds Wound #8 Left,Distal,Midline,Anterior Lower Leg: Clean wound with Normal Saline. Antibacterial soap, wash wounds, rinse and pat dry prior to dressing wounds Anesthetic (add to Medication List): Wound #7 Left,Proximal,Midline,Anterior Lower Leg: Topical Lidocaine 4% cream applied to wound bed prior to debridement (In Clinic Only). Wound #8 Left,Distal,Midline,Anterior Lower Leg: Topical Lidocaine 4% cream applied to wound bed prior to debridement (In Clinic Only). Skin Barriers/Peri-Wound Care: Wound #7 Left,Proximal,Midline,Anterior Lower Leg: Moisturizing lotion Wound #8 Left,Distal,Midline,Anterior Lower Leg: Moisturizing lotion Primary Wound Dressing: Wound #7 Left,Proximal,Midline,Anterior  Lower Leg: Silver Alginate Wound #8 Left,Distal,Midline,Anterior Lower Leg: Silver Alginate Secondary Dressing: Wound #7 Left,Proximal,Midline,Anterior Lower Leg: ABD and Kerlix/Conform Wound #8 Left,Distal,Midline,Anterior Lower Leg: ABD and Kerlix/Conform Dressing Change Frequency: Wound #7 Left,Proximal,Midline,Anterior Lower Leg: Change Dressing Monday, Wednesday, Friday Wound #8 Left,Distal,Midline,Anterior Lower Leg: Change Dressing Monday, Wednesday, Friday Follow-up Appointments: Wound #7 Left,Proximal,Midline,Anterior Lower Leg: Return Appointment in 2 weeks. Wound #8 Left,Distal,Midline,Anterior Lower Leg: Return Appointment in 2 weeks. Edema Control: Wound #7 Left,Proximal,Midline,Anterior Lower Leg: Other: - Tubi-grip F Wound #8 Left,Distal,Midline,Anterior Lower Leg: Other: - [place]: Wound #7 Left,Proximal,Midline,Anterior Lower Leg: Continue Home Health Visits - Hospice Nurse-please order dressing supplies for patient. Home Health Nurse may visit PRN to address patient s wound care needs. *FACE TO FACE ENCOUNTER: MEDICARE and MEDICAID PATIENTS: I certify that this patient is under my care and that I had a face-to-face encounter that meets the physician face-to-face encounter requirements with this patient on this date. The encounter with the patient was in whole or in part for the following MEDICAL CONDITION: (primary reason for [place]) MEDICAL NECESSITY: I certify, that based on my findings, NURSING services are a medically necessary home health service. *HOME BOUND STATUS: I certify that my clinical findings support that this patient is homebound (i.e., Due to illness or injury, pt requires aid of supportive devices such as crutches, cane, wheelchairs, walkers, the use of special transportation or the assistance of another person to leave their place of residence. There is a normal inability to leave the home and doing so requires  considerable and taxing effort. Other absences are for medical reasons / religious services and are infrequent or of short duration when for other reasons). *Please direct any NON-WOUND related issues/requests for orders to patient's Primary Care Physician. *If current dressing causes regression in wound condition, may D/C ordered dressing product/s and apply Normal Saline Moist Dressing daily until next [place] / Other MD appointment. [place] of regression in wound condition at [phone]. Please direct any NON-WOUND related issues/requests for orders to patient's Primary Care Physician Skin Substitute applied. Do not remove dressing below the steristrips. Call office with any questions [phone]. Wound #8 Left,Distal,Midline,Anterior Lower Leg: Continue Home Health Visits - Hospice Nurse-please order dressing supplies for patient. Home Health Nurse may visit PRN to address patient s wound care needs. *FACE TO FACE ENCOUNTER: MEDICARE and MEDICAID PATIENTS: I certify that this patient is under my care and that I had a face-to-face encounter that meets the physician face-to-face encounter requirements with this patient on this date. The encounter with the patient was  in whole or in part for the following MEDICAL CONDITION: (primary reason for Home Healthcare) MEDICAL NECESSITY: I certify, that based on my findings, NURSING services are a medically necessary home health service. *HOME BOUND STATUS: I certify that my clinical findings support that this patient is homebound (i.e., Due to illness or injury, pt requires aid of supportive devices such as crutches, cane, wheelchairs, walkers, the use of special transportation or the assistance of another person to leave their place of residence. There is a normal inability to leave the home and doing so requires considerable and taxing effort. Other absences are for medical reasons / religious services and are infrequent  or of short duration when for other reasons). *Please direct any NON-WOUND related issues/requests for orders to patient's Primary Care Physician. *If [place], Jesselle ([id number]) current dressing causes regression in wound condition, may D/C ordered dressing product/s and apply Normal Saline Moist Dressing daily until next [place] / Other MD appointment. [place] of regression in wound condition at [phone]. Please direct any NON-WOUND related issues/requests for orders to patient's Primary Care Physician Skin Substitute applied. Do not remove dressing below the steristrips. Call office with any questions [phone]. 1. I would recommend currently that we actually go ahead and initiate treatment with a silver alginate dressing over the wound to help keep this as dry as possible. 2. I am also can recommend at this time that we have the patient utilize over top of this a ABD pad and roll gauze to secure in place. 3. I am also can recommend that we use Tubigrip F in order to provide compression hopefully this will be of benefit for her. 3. She is can have formal arterial studies it sounds like on Thursday we will see what this shows as well though her ABI here in the clinic appeared to be good at 0.99. 4. I would recommend she continue to elevate her legs much as possible we did discuss that during the office visit today as well. We will see patient back for reevaluation in 2 weeks here in the clinic. If anything worsens or changes patient will contact our office for additional recommendations. Electronic Signature(s) Signed: 06/29/2020 4:14:59 PM By: Worthy Keeler PA-C Entered By: Worthy Keeler on 06/29/2020 16:14:59 Iser, [name] ([id number]) -------------------------------------------------------------------------------- ROS/PFSH Details Patient Name: [name] Date of Service: 06/29/2020 9:15 AM Medical Record Number: [id number] Patient Account  Number: [id number] Date of Birth/Sex: 21-Feb-1971 (49 y.o. F) Treating RN: Cornell Barman Primary Care Provider: Alma Friendly Other Clinician: Referring Provider: Alma Friendly Treating Provider/Extender: Skipper Cliche in Treatment: 0 Information Obtained From Patient Eyes Complaints and Symptoms: Negative for: Dry Eyes; Vision Changes; Glasses / Contacts Medical History: Negative for: Cataracts; Glaucoma; Optic Neuritis Ear/Nose/Mouth/Throat Complaints and Symptoms: Negative for: Difficult clearing ears; Sinusitis Medical History: Negative for: Chronic sinus problems/congestion; Middle ear problems Hematologic/Lymphatic Complaints and Symptoms: Negative for: Bleeding / Clotting Disorders; Human Immunodeficiency Virus Medical History: Negative for: Anemia; Hemophilia; Human Immunodeficiency Virus; Lymphedema; Sickle Cell Disease Gastrointestinal Complaints and Symptoms: Negative for: Frequent diarrhea; Nausea; Vomiting Medical History: Negative for: Cirrhosis ; Colitis; Crohnos; Hepatitis A; Hepatitis B; Hepatitis C Endocrine Complaints and Symptoms: Negative for: Hepatitis; Thyroid disease; Polydypsia (Excessive Thirst) Medical History: Positive for: Type II Diabetes Time with diabetes: 30 years Treated with: Insulin, Oral agents Blood sugar tested every day: Yes Tested : daily Blood sugar testing results: Breakfast: 225 Genitourinary Complaints and Symptoms: Negative for: Kidney failure/ Dialysis; Incontinence/dribbling Medical History: Negative for:  End Stage Renal Disease Immunological Complaints and Symptoms: Negative for: Hives; Itching Tamburo, Alda ([id number]) Medical History: Negative for: Lupus Erythematosus; Raynaudos; Scleroderma Integumentary (Skin) Complaints and Symptoms: Positive for: Wounds; Bleeding or bruising tendency Medical History: Negative for: History of Burn; History of pressure wounds Musculoskeletal Complaints and  Symptoms: Negative for: Muscle Pain; Muscle Weakness Medical History: Negative for: Gout; Rheumatoid Arthritis; Osteoarthritis; Osteomyelitis Neurologic Complaints and Symptoms: Negative for: Numbness/parasthesias; Focal/Weakness Medical History: Positive for: Neuropathy - legs, feet and hands Negative for: Dementia; Quadriplegia; Paraplegia; Seizure Disorder Past Medical History Notes: Stroke 2020 Left weakness Psychiatric Complaints and Symptoms: Positive for: Anxiety Negative for: Claustrophobia Medical History: Negative for: Anorexia/bulimia; Confinement Anxiety Respiratory Medical History: Positive for: Asthma; Chronic Obstructive Pulmonary Disease (COPD); Sleep Apnea Cardiovascular Medical History: Positive for: Arrhythmia - sinus tachycardia; Congestive Heart Failure; Coronary Artery Disease; Hypertension Oncologic Medical History: Positive for: Received Radiation Negative for: Received Chemotherapy Past Medical History Notes: Breast cancer-right mastectomy Immunizations Pneumococcal Vaccine: Received Pneumococcal Vaccination: Yes Implantable Devices None Family and Social History Cancer: Yes - Siblings; Diabetes: Yes - Siblings,Father,Mother; Heart Disease: Yes - Siblings,Father,Mother; Hereditary Spherocytosis: No; Hypertension: Yes - Mother,Father,Siblings; Kidney Disease: No; Lung Disease: Yes - Mother; Seizures: Yes - Mother; Stroke: Yes - Mother,Siblings,Father; Thyroid Problems: No; Tuberculosis: No; Current every day smoker - 40 years; Marital Status - Married; Alcohol Use: [place]in[place], Demri ([id number]) Never; Drug Use: No History; Caffeine Use: Daily - coffee, soda; Financial Concerns: No; Food, Clothing or Shelter Needs: No; Support System Lacking: No; Transportation Concerns: No Engineer, maintenance) Signed: 06/29/2020 4:39:05 PM By: Gretta Cool, BSN, RN, CWS, Kim RN, BSN Signed: 06/30/2020 5:24:08 PM By: Worthy Keeler PA-C Entered By: Gretta Cool, BSN, RN, CWS,  Kim on 06/29/2020 09:46:18 [place], [name] ([id number]) -------------------------------------------------------------------------------- [place] Details Patient Name: [name] Date of Service: 06/29/2020 Medical Record Number: [id number] Patient Account Number: [id number] Date of Birth/Sex: Nov 22, 1970 (49 y.o. 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 J44.9 Chronic obstructive pulmonary disease, unspecified Facility Procedures CPT4 Code: [id number] Description: 99214 - WOUND CARE VISIT-LEV 4 EST PT Modifier: Quantity: 1 Physician Procedures CPT4 Code: [id number] Description: [id number] - WC PHYS LEVEL 3 - EST PT Modifier: Quantity: 1 CPT4 Code: Description: ICD-10 Diagnosis Description I87.2 Venous insufficiency (chronic) (peripheral) L97.822 Non-pressure chronic ulcer of other part of left lower leg with fat lay D05.81 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

## 2020-07-02 ENCOUNTER — Encounter (INDEPENDENT_AMBULATORY_CARE_PROVIDER_SITE_OTHER): Payer: Self-pay | Admitting: Vascular Surgery

## 2020-07-02 ENCOUNTER — Ambulatory Visit (INDEPENDENT_AMBULATORY_CARE_PROVIDER_SITE_OTHER): Payer: BC Managed Care – PPO | Admitting: Vascular Surgery

## 2020-07-02 VITALS — BP 105/68 | HR 96 | Resp 16 | Ht 64.0 in | Wt 271.0 lb

## 2020-07-02 DIAGNOSIS — I739 Peripheral vascular disease, unspecified: Secondary | ICD-10-CM

## 2020-07-02 DIAGNOSIS — I83023 Varicose veins of left lower extremity with ulcer of ankle: Secondary | ICD-10-CM | POA: Diagnosis not present

## 2020-07-02 DIAGNOSIS — I2510 Atherosclerotic heart disease of native coronary artery without angina pectoris: Secondary | ICD-10-CM | POA: Diagnosis not present

## 2020-07-02 DIAGNOSIS — E785 Hyperlipidemia, unspecified: Secondary | ICD-10-CM

## 2020-07-02 DIAGNOSIS — L97329 Non-pressure chronic ulcer of left ankle with unspecified severity: Secondary | ICD-10-CM

## 2020-07-02 DIAGNOSIS — I6521 Occlusion and stenosis of right carotid artery: Secondary | ICD-10-CM | POA: Diagnosis not present

## 2020-07-02 DIAGNOSIS — I87312 Chronic venous hypertension (idiopathic) with ulcer of left lower extremity: Secondary | ICD-10-CM | POA: Diagnosis not present

## 2020-07-02 DIAGNOSIS — I10 Essential (primary) hypertension: Secondary | ICD-10-CM

## 2020-07-02 LAB — URINE CULTURE
MICRO NUMBER:: 11348549
SPECIMEN QUALITY:: ADEQUATE

## 2020-07-06 ENCOUNTER — Inpatient Hospital Stay: Payer: BC Managed Care – PPO

## 2020-07-07 DIAGNOSIS — J9610 Chronic respiratory failure, unspecified whether with hypoxia or hypercapnia: Secondary | ICD-10-CM | POA: Diagnosis not present

## 2020-07-08 ENCOUNTER — Telehealth: Payer: Self-pay | Admitting: Primary Care

## 2020-07-08 NOTE — Telephone Encounter (Signed)
Delice Bison came into office and dropped off report for patient in regards to wound patient was seen for at their office. Report, care facility info, and card left for provider in case would like to discuss anything. Placed in tower.

## 2020-07-09 ENCOUNTER — Telehealth: Payer: Self-pay | Admitting: *Deleted

## 2020-07-09 ENCOUNTER — Telehealth: Payer: Self-pay

## 2020-07-09 ENCOUNTER — Other Ambulatory Visit: Payer: Self-pay

## 2020-07-09 ENCOUNTER — Ambulatory Visit (INDEPENDENT_AMBULATORY_CARE_PROVIDER_SITE_OTHER): Payer: BC Managed Care – PPO | Admitting: Nurse Practitioner

## 2020-07-09 ENCOUNTER — Encounter (INDEPENDENT_AMBULATORY_CARE_PROVIDER_SITE_OTHER): Payer: Self-pay | Admitting: Nurse Practitioner

## 2020-07-09 VITALS — BP 128/81 | HR 78 | Ht 64.0 in | Wt 268.0 lb

## 2020-07-09 DIAGNOSIS — R600 Localized edema: Secondary | ICD-10-CM

## 2020-07-09 DIAGNOSIS — M7989 Other specified soft tissue disorders: Secondary | ICD-10-CM

## 2020-07-09 DIAGNOSIS — R059 Cough, unspecified: Secondary | ICD-10-CM

## 2020-07-09 DIAGNOSIS — R3129 Other microscopic hematuria: Secondary | ICD-10-CM

## 2020-07-09 NOTE — Telephone Encounter (Signed)
Placed in your box for review.  °

## 2020-07-09 NOTE — Telephone Encounter (Signed)
[name] / Boneta Lucks - would you be willing to do a peer to peer for patient's lupron.   This is Dr. Senaida Lange note on why Lupron is needed.-   #Given premenopausal status , recommend proceeding with aromatase inhibitor plus Eligard [ovarian suppression].  Again reviewed the potential side effects including but not limited to hot flashes; arthralgias etc.

## 2020-07-09 NOTE — Telephone Encounter (Signed)
We can repeat chest xray in 4-6 weeks if she would like, but if she is feeling better, may not be necessary.  Urine culture was negative, so nothing needing as long as she is not seeing blood in her urine now.  Defer to PCP as to what she needs to do with her hands.

## 2020-07-09 NOTE — Telephone Encounter (Signed)
-----   Message from Althea Charon Chrismon sent at 07/09/2020  3:51 PM EST ----- This Lupron was denied.  I have scanned into the media the letter from [employer] and their reasoning.  There is also a peer-to-peer number if Dr. B wants to do that when he returns.  The number is [phone] extension [phone].  You can see the letter in the media tab.  Thanks, Lu

## 2020-07-09 NOTE — Telephone Encounter (Signed)
Patient stated that she is still not feeling any better and she is still having blood in her urine. Encouraged patient that if symptoms became worse, she should report to UC or ED. Patient verbalized understanding. Any further recommendations?

## 2020-07-09 NOTE — Telephone Encounter (Signed)
Went to see pt today. Was asking if [name] planned to do a F/U chest xray after the antibiotic was completed today for the cough she was having.   Also, there was blood found in the urine on Monday and pt has not heard back as to what she needed to do about that if anything.  Pt was asking if PT could be done for her hands.  Call Tobi Bastos RN with [facility] [phone]

## 2020-07-09 NOTE — Progress Notes (Signed)
History of Present Illness  There is no documented history at this time  Assessments & Plan   There are no diagnoses linked to this encounter.    Additional instructions  Subjective:  Patient presents with venous ulcer of the Left lower extremity.    Procedure:  3 layer unna wrap was placed Left lower extremity.   Plan:   Follow up in one week.  

## 2020-07-11 DIAGNOSIS — Z170 Estrogen receptor positive status [ER+]: Secondary | ICD-10-CM | POA: Insufficient documentation

## 2020-07-11 NOTE — Telephone Encounter (Signed)
Follow up in office next week if symptoms persist and she does not go to [facility] for eval

## 2020-07-12 NOTE — Telephone Encounter (Signed)
Please clarify if she IS seeing blood in her urine.  We need to repeat UA and add urine culture AND urine microscopic.  If this comes back negative then we may need to complete a transvaginal and pelvic ultrasound for potential vaginal bleeding.   Needs repeat xray now if she is not better, I will put In orders for her to have this done at the outpatient imaging center at [facility] on [address].

## 2020-07-12 NOTE — Addendum Note (Signed)
Addended by: Doreene Nest on: 07/12/2020 07:30 PM   Modules accepted: Orders

## 2020-07-13 ENCOUNTER — Ambulatory Visit: Payer: BC Managed Care – PPO | Admitting: Physician Assistant

## 2020-07-13 ENCOUNTER — Encounter (INDEPENDENT_AMBULATORY_CARE_PROVIDER_SITE_OTHER): Payer: Self-pay | Admitting: Vascular Surgery

## 2020-07-13 NOTE — Telephone Encounter (Signed)
Called Patient lab appointment made she is able to see blood in urine. She will go by and get Xray today. Not able to get labs until tomorrow. Gave all information and repeated back to me. No questions at this time.

## 2020-07-13 NOTE — Progress Notes (Signed)
MRN : [id number]  [name] is a 50 y.o. ([birth year]) female who presents with chief complaint of  Chief Complaint  Patient presents with  . New Patient (Initial Visit)    Ref Carlis Abbott PAD recurrent wounds  .  History of Present Illness:   Patient is seen for evaluation of leg pain and swelling associated with new onset ulceration of the left leg. The patient first noticed the swelling remotely. The swelling is associated with pain and discoloration. The pain and swelling worsens with prolonged dependency and improves with elevation. The pain is unrelated to activity.  The patient notes that in the morning the legs are better but the leg symptoms worsened throughout the course of the day. The patient has also noted a progressive worsening of the discoloration in the ankle and shin area.   The patient notes that an ulcer has developed acutely without specific trauma and since it occurred it has been very slow to heal.  There is a moderate amount of drainage associated with the open area.  The wound is also very painful.  The patient denies claudication symptoms or rest pain symptoms.  The patient denies DJD and LS spine disease.  The patient has not had any past angiography, interventions or vascular surgery.  Elevation makes the leg symptoms better, dependency makes them much worse. The patient denies any recent changes in medications.  The patient has not been wearing graduated compression.  The patient denies a history of DVT or PE. There is no prior history of phlebitis. There is no history of primary lymphedema.  No history of malignancies. No history of trauma or groin or pelvic surgery. There is no history of radiation treatment to the groin or pelvis    Current Meds  Medication Sig  . acetaminophen (TYLENOL) 500 MG tablet Take 1,000 mg by mouth every 6 (six) hours as needed for moderate pain.   Marland Kitchen albuterol (VENTOLIN HFA) 108 (90 Base) MCG/ACT inhaler Inhale 2 puffs into  the lungs every 6 (six) hours as needed for wheezing or shortness of breath.  . ALPRAZolam (XANAX) 1 MG tablet Take 1 mg by mouth 4 (four) times daily as needed for anxiety.   Marland Kitchen anastrozole (ARIMIDEX) 1 MG tablet Take 1 tablet (1 mg total) by mouth daily. (Patient taking differently: Take 1 mg by mouth daily. Will start on 12/27)  . aspirin EC 81 MG tablet Take 81 mg by mouth daily before breakfast.   . benzonatate (TESSALON) 200 MG capsule Take 1 capsule (200 mg total) by mouth 3 (three) times daily as needed for cough.  . budesonide-formoterol (SYMBICORT) 80-4.5 MCG/ACT inhaler INHALE 2 PUFFS BY MOUTH EVERY 12 HOURS TO PREVENT COUGH OR WHEEZING *RINSE MOUTH AFTER EACH USE* (Patient taking differently: Inhale 2 puffs into the lungs in the morning and at bedtime.)  . busPIRone (BUSPAR) 30 MG tablet Take 30 mg by mouth 2 (two) times daily.  . citalopram (CELEXA) 20 MG tablet Take 1 tablet (20 mg total) by mouth daily.  . Dapagliflozin-metFORMIN HCl ER 5-500 MG TB24 Take 1 tablet by mouth daily with breakfast.  . desvenlafaxine (PRISTIQ) 100 MG 24 hr tablet Take 100 mg by mouth daily.  Marland Kitchen doxycycline (VIBRA-TABS) 100 MG tablet Take 1 tablet (100 mg total) by mouth 2 (two) times daily.  . famotidine (PEPCID) 20 MG tablet TAKE 1 TABLET (20 MG TOTAL) BY MOUTH 2 (TWO) TIMES DAILY. FOR HEARTBURN. (Patient taking differently: Take 20 mg by mouth 2 (two) times  daily.)  . Insulin Pen Needle 32G X 4 MM MISC Used to give insulin injections twice daily.  . insulin regular human CONCENTRATED (HUMULIN R) 500 UNIT/ML injection Inject 30 Units into the skin 3 (three) times daily with meals.   . Insulin Syringe-Needle U-100 (GLOBAL INJECT EASE INSULIN SYR) 30G X 1/2" 1 ML MISC See admin instructions.  Marland Kitchen ipratropium-albuterol (DUONEB) 0.5-2.5 (3) MG/3ML SOLN Take 3 mLs by nebulization every 4 (four) hours as needed (wheezing/shortness of breath).  . metolazone (ZAROXOLYN) 5 MG tablet Take 5 mg by mouth daily.   Marland Kitchen  nystatin-triamcinolone ointment (MYCOLOG) Apply 1 application topically 2 (two) times daily.  . ondansetron (ZOFRAN) 4 MG tablet Take 4 mg by mouth 2 (two) times daily.  Glory Rosebush Delica Lancets [id number] MISC 1 each by Does not apply route 3 (three) times daily. Use to monitor glucose levels three times daily; E11.9; E10.42  . ONETOUCH VERIO test strip USE AS DIRECTED TO CHECK BLOOD SUGAR 3 TIMES DAILY  . OXYGEN Inhale 2-4 L into the lungs 2 (two) times daily as needed (shortness of breath).   . pregabalin (LYRICA) 300 MG capsule Take 1 capsule (300 mg total) by mouth 2 (two) times daily. For nerve pain.  Marland Kitchen rOPINIRole (REQUIP) 1 MG tablet TAKE 1 TABLET BY MOUTH EVERY NIGHT AT BEDTIME FOR RESTLESS LEGS.  . rosuvastatin (CRESTOR) 20 MG tablet Take 1 tablet (20 mg total) by mouth daily.  Marland Kitchen spironolactone (ALDACTONE) 50 MG tablet TAKE 1 TABLET BY MOUTH ONCE A DAY  . tamoxifen (NOLVADEX) 20 MG tablet Take 1 tablet (20 mg total) by mouth daily.  Marland Kitchen torsemide (DEMADEX) 20 MG tablet TAKE 1 TABLET BY MOUTH 4 TIMES DAILY (Patient taking differently: Take 20 mg by mouth 2 (two) times daily.)  . traZODone (DESYREL) 100 MG tablet Take 200 mg by mouth at bedtime.   Marland Kitchen ULTICARE MINI PEN NEEDLES 31G X 6 MM MISC USE AS DIRECTED 3 TIMES DAILY    Past Medical History:  Diagnosis Date  . Anxiety    takes Prozac daily  . Anxiety   . Aortic valve calcification   . Asthma    Advair and Spirva daily  . Asthma   . Bipolar disorder ([place])   . CAD (coronary artery disease)    a. LHC 11/2013 done for CP/fluid retention: mild disease in prox LAD, mild-mod disease in mRCA, EF 60% with normal LVEDP. b. Normal nuc 03/2016.  Marland Kitchen Cancer ([place])   . CHF (congestive heart failure) ([place])   . Chronic diastolic CHF (congestive heart failure) ([place])   . Chronic heart failure with preserved ejection fraction ([place]) 11/16/2018  . CKD (chronic kidney disease), stage II   . COPD (chronic obstructive pulmonary disease) (HCC)    a. nocturnal O2.   Marland Kitchen COPD (chronic obstructive pulmonary disease) ([place])   . Coronary artery disease   . Decreased urine stream   . Diabetes mellitus   . Diabetes mellitus without complication ([place])   . Dyspnea   . Family history of adverse reaction to anesthesia    mom gets nauseated  . GERD (gastroesophageal reflux disease)    takes Pepcid daily  . History of blood clots    left leg 3-68yrs ago  . Hyperlipidemia   . Hypertension   . Hypertriglyceridemia   . Inguinal hernia, left 01/2015  . Muscle spasm   . Open wound of genital labia   . Peripheral neuropathy   . RBBB   . Seizures ([place])   .  Sepsis ([place]) 01/19/2018  . Sinus tachycardia    a. persistent since 2009.  Marland Kitchen Smokers' cough ([place])   . Stroke [facility]) 1989   left sided weakness  . TIA (transient ischemic attack)   . Tobacco abuse   . Vulvar abscess 01/23/2018    Past Surgical History:  Procedure Laterality Date  . APPLICATION OF WOUND VAC Right 01/29/2020   Procedure: APPLICATION OF WOUND VAC;  Surgeon: Ronny Bacon, MD;  Location: [facility] ORS;  Service: General;  Laterality: Right;  . BREAST BIOPSY Right 11/06/2019   [place] core path pending venus clip  . HERNIA REPAIR Left   . INCISION AND DRAINAGE ABSCESS N/A 01/26/2018   Procedure: INCISION AND DEBRIDEMENT OF VULVAR NECROTIZING SOFT TISSUE INFECTION;  Surgeon: Greer Pickerel, MD;  Location: [place];  Service: General;  Laterality: N/A;  . INCISION AND DRAINAGE PERIRECTAL ABSCESS N/A 01/22/2018   Procedure: IRRIGATION AND DEBRIDEMENT LABIAL/VULVAR AREA;  Surgeon: Coralie Keens, MD;  Location: [place];  Service: General;  Laterality: N/A;  . INCISION AND DRAINAGE PERIRECTAL ABSCESS N/A 01/29/2018   Procedure: IRRIGATION AND DEBRIDEMENT VULVA;  Surgeon: Excell Seltzer, MD;  Location: [place];  Service: General;  Laterality: N/A;  . INGUINAL HERNIA REPAIR Left 04/08/2015   Procedure: OPEN LEFT INGUINAL HERNIA REPAIR WITH MESH;  Surgeon: Ralene Ok, MD;  Location: [place];  Service: General;   Laterality: Left;  . INSERTION OF MESH Left 04/08/2015   Procedure: INSERTION OF MESH;  Surgeon: Ralene Ok, MD;  Location: [place];  Service: General;  Laterality: Left;  . LAPAROSCOPY     Endometriosis  . LEFT HEART CATHETERIZATION WITH CORONARY ANGIOGRAM N/A 12/05/2013   Procedure: LEFT HEART CATHETERIZATION WITH CORONARY ANGIOGRAM;  Surgeon: Jettie Booze, MD;  Location: [facility] CATH LAB;  Service: Cardiovascular;  Laterality: N/A;  . right kidney drained    . SIMPLE MASTECTOMY WITH AXILLARY SENTINEL NODE BIOPSY Right 12/23/2019   Procedure: Right SIMPLE MASTECTOMY WITH AXILLARY SENTINEL NODE BIOPSY;  Surgeon: Ronny Bacon, MD;  Location: [facility];  Service: General;  Laterality: Right;  . TEE WITHOUT CARDIOVERSION N/A 11/28/2013   Procedure: TRANSESOPHAGEAL ECHOCARDIOGRAM (TEE);  Surgeon: Thayer Headings, MD;  Location: [place];  Service: Cardiovascular;  Laterality: N/A;  . WOUND DEBRIDEMENT Right 01/29/2020   Procedure: DEBRIDEMENT WOUND, Excisional debridement skin, subcutaneous and muscle right chest wall;  Surgeon: Ronny Bacon, MD;  Location: [facility];  Service: General;  Laterality: Right;    Social History Social History   Tobacco Use  . Smoking status: Current Some Day Smoker    Packs/day: 0.25    Years: 36.00    Pack years: 9.00    Types: Cigarettes    Start date: 07/11/1981  . Smokeless tobacco: Never Used  . Tobacco comment: 1 ppd now  Vaping Use  . Vaping Use: Some days  Substance Use Topics  . Alcohol use: No  . Drug use: No    Family History Family History  Problem Relation Age of Onset  . Venous thrombosis Brother   . Other Brother        BRAIN TUMOR  . Asthma Father   . Diabetes Father   . Coronary artery disease Mother   . Hypertension Mother   . Diabetes Mother   . Breast cancer Mother 6  . Asthma Sister   . Diabetes type II Brother   No family history of bleeding/clotting disorders, porphyria or autoimmune disease   Allergies   Allergen Reactions  . Adhesive [Tape] Rash  and Other (See Comments)    TAKES OFF THE SKIN (CERTAIN MEDICAL TAPES DO THIS!!)  . Metoprolol Shortness Of Breath    Occurrence of shortness of breath after 3 days  . Montelukast Shortness Of Breath  . Morphine Sulfate Anaphylaxis, Shortness Of Breath and Nausea And Vomiting    Swollen Throat - Able to tolerate dilaudid  . Penicillins Anaphylaxis, Hives and Shortness Of Breath    Throat swells Has patient had a PCN reaction causing immediate rash, facial/tongue/throat swelling, SOB or lightheadedness with hypotension: Yes Has patient had a PCN reaction causing severe rash involving mucus membranes or skin necrosis: No Has patient had a PCN reaction that required hospitalization: Yes Has patient had a PCN reaction occurring within the last 10 years: No If all of the above answers are "NO", then may proceed with Cephalosporin use.   . Prednisone Anaphylaxis  . Diltiazem Swelling  . Gabapentin Swelling  . Midodrine     Lightheaded and falling down     REVIEW OF SYSTEMS (Negative unless checked)  Constitutional: [] Weight loss  [] Fever  [] Chills Cardiac: [] Chest pain   [] Chest pressure   [] Palpitations   [] Shortness of breath when laying flat   [] Shortness of breath with exertion. Vascular:  [] Pain in legs with walking   [] Pain in legs at rest  [] History of DVT   [] Phlebitis   [] Swelling in legs   [x] Varicose veins   [x] Non-healing ulcers Pulmonary:   [] Uses home oxygen   [] Productive cough   [] Hemoptysis   [] Wheeze  [] COPD   [] Asthma Neurologic:  [] Dizziness   [] Seizures   [] History of stroke   [] History of TIA  [] Aphasia   [] Vissual changes   [] Weakness or numbness in arm   [] Weakness or numbness in leg Musculoskeletal:   [] Joint swelling   [] Joint pain   [] Low back pain Hematologic:  [] Easy bruising  [] Easy bleeding   [] Hypercoagulable state   [] Anemic Gastrointestinal:  [] Diarrhea   [] Vomiting  [] Gastroesophageal reflux/heartburn    [] Difficulty swallowing. Genitourinary:  [] Chronic kidney disease   [] Difficult urination  [] Frequent urination   [] Blood in urine Skin:  [] Rashes   [] Ulcers  Psychological:  [] History of anxiety   []  History of major depression.  Physical Examination  Vitals:   07/02/20 1053  BP: 105/68  Pulse: 96  Resp: 16  Weight: 271 lb (122.9 kg)  Height: 5\' 4"  (1.626 m)   Body mass index is 46.52 kg/m. Gen: WD/WN, NAD Head: [place]/AT, No temporalis wasting.  Ear/Nose/Throat: Hearing grossly intact, nares w/o erythema or drainage, poor dentition Eyes: PER, EOMI, sclera nonicteric.  Neck: Supple, no masses.  No bruit or JVD.  Pulmonary:  Good air movement, clear to auscultation bilaterally, no use of accessory muscles.  Cardiac: RRR, normal S1, S2, no Murmurs. Vascular: 2-3+ edema of the left leg with severe venous changes of the left leg.  Venous ulcer noted in the ankle area on the left, noninfected. Vessel Right Left  Radial Palpable Palpable  PT Not Palpable Not Palpable  DP Trace Palpable Trace Palpable  Gastrointestinal: soft, non-distended. No guarding/no peritoneal signs.  Musculoskeletal: M/S 5/5 throughout.  No deformity or atrophy.  Neurologic: CN 2-12 intact. Pain and light touch intact in extremities.  Symmetrical.  Speech is fluent. Motor exam as listed above. Psychiatric: Judgment intact, Mood & affect appropriate for pt's clinical situation. Dermatologic: Venous stasis dermatitis with ulcers present on the left.  No changes consistent with cellulitis.  CBC Lab Results  Component Value Date  WBC 11.4 (H) 06/29/2020   HGB 13.0 06/29/2020   HCT 40.3 06/29/2020   MCV 78.4 (L) 06/29/2020   PLT 172 06/29/2020    BMET    Component Value Date/Time   NA 134 (L) 06/29/2020 1450   NA 137 01/08/2018 1117   K 4.0 06/29/2020 1450   CL 100 06/29/2020 1450   CO2 25 06/29/2020 1450   GLUCOSE 115 (H) 06/29/2020 1450   BUN 27 (H) 06/29/2020 1450   BUN 31 (H) 01/08/2018 1117    CREATININE 1.22 (H) 06/29/2020 1450   CREATININE 1.59 (H) 07/18/2019 1506   CALCIUM 9.1 06/29/2020 1450   GFRNONAA 54 (L) 06/29/2020 1450   GFRAA 51 (L) 02/25/2020 1504   Estimated Creatinine Clearance: 70.6 mL/min (A) (by C-G formula based on SCr of 1.22 mg/dL (H)).  COAG Lab Results  Component Value Date   INR 1.0 03/21/2019   INR 1.0 02/27/2019   INR 1.2 09/13/2018    Radiology DG Chest 2 View  Result Date: 07/01/2020 CLINICAL DATA:  cough, dyspnea, history of COPD EXAM: CHEST - 2 VIEW COMPARISON:  05/20/2020 and prior. FINDINGS: Mild hypoinflation. Hazy right basilar opacities. No pneumothorax or pleural effusion. Stable cardiomediastinal silhouette. Multilevel spondylosis. IMPRESSION: Hazy right basilar opacities, atelectasis versus infiltrate. Electronically Signed   By: Primitivo Gauze M.D.   On: 07/01/2020 13:41     Assessment/Plan 1. Venous ulcer of ankle, left (HCC) No surgery or intervention at this point in time.    I have had a long discussion with the patient regarding venous insufficiency and why it  causes symptoms, specifically venous ulceration . I have discussed with the patient the chronic skin changes that accompany venous insufficiency and the long term sequela such as infection and recurring  ulceration.  Patient will be placed in [employer] which will be changed weekly drainage permitting.  In addition, behavioral modification including several periods of elevation of the lower extremities during the day will be continued. Achieving a position with the ankles at heart level was stressed to the patient  The patient is instructed to begin routine exercise, especially walking on a daily basis  Patient should undergo duplex ultrasound of the venous system to ensure that DVT or reflux is not present.  Following the review of the ultrasound the patient will follow up in one week to reassess the degree of swelling and the control that Unna therapy is offering.    The patient can be assessed for graduated compression stockings or wraps as well as a Lymph Pump once the ulcers are healed.   2. PAD (peripheral artery disease) (HCC) Recommend:  I do not find evidence of life style limiting vascular disease. The patient specifically denies life style limitation.  Previous noninvasive studies including ABI's of the legs do not identify critical vascular problems.  The patient should continue walking and begin a more formal exercise program. The patient should continue his antiplatelet therapy and aggressive treatment of the lipid abnormalities.   3. Carotid stenosis, asymptomatic, right Recommend:  Given the patient's asymptomatic subcritical stenosis no further invasive testing or surgery at this time.  Continue antiplatelet therapy as prescribed Continue management of CAD, HTN and Hyperlipidemia Healthy heart diet,  encouraged exercise at least 4 times per week.  Follow up in 12 months with duplex ultrasound and physical exam   4. Coronary artery disease involving native coronary artery of native heart without angina pectoris Continue cardiac and antihypertensive medications as already ordered and reviewed, no changes  at this time.  Continue statin as ordered and reviewed, no changes at this time  Nitrates PRN for chest pain   5. Essential hypertension Continue antihypertensive medications as already ordered, these medications have been reviewed and there are no changes at this time.   6. Hyperlipidemia, unspecified hyperlipidemia type Continue statin as ordered and reviewed, no changes at this time     [name], MD  07/13/2020 1:37 PM

## 2020-07-13 NOTE — Telephone Encounter (Signed)
Called and left a VM. Hopefully they will call me back shortly.   Durenda Hurt, NP 07/13/2020 10:43 AM

## 2020-07-13 NOTE — Telephone Encounter (Signed)
Boneta Lucks - Please let us know the outcome of the peer to peer.

## 2020-07-14 ENCOUNTER — Other Ambulatory Visit: Payer: BC Managed Care – PPO

## 2020-07-14 NOTE — Telephone Encounter (Signed)
I don't believe I saw this, I don't see it in my inbox.

## 2020-07-14 NOTE — Telephone Encounter (Signed)
Sorry in it now

## 2020-07-15 ENCOUNTER — Telehealth: Payer: Self-pay | Admitting: Internal Medicine

## 2020-07-15 ENCOUNTER — Other Ambulatory Visit: Payer: BC Managed Care – PPO | Admitting: Orthotics

## 2020-07-15 NOTE — Telephone Encounter (Signed)
Noted, notes reviewed.

## 2020-07-15 NOTE — Progress Notes (Signed)
MRN : [id number]  [name] is a 50 y.o. (06/11/1971) female who presents with chief complaint of No chief complaint on file. Marland Kitchen  History of Present Illness:   Patient is seen for follow up evaluation of leg pain and swelling associated with venous ulceration. The patient was recently seen here and started on Unna boot therapy.  The swelling abruptly became much worse bilaterally and is associated with pain and discoloration. The pain and swelling worsens with prolonged dependency and improves with elevation.  The patient notes that in the morning the legs are better but the leg symptoms worsened throughout the course of the day. The patient has also noted a progressive worsening of the discoloration in the ankle and shin area.   Today she reports that her leg is feeling better.  The patient notes that they have been able to tolerate the [employer].  The patient states that they have been elevating as much as possible. The patient denies any recent changes in medications.  The patient denies a history of DVT or PE. There is no prior history of phlebitis. There is no history of primary lymphedema.  No SOB or increased cough.  No sputum production.  No recent episodes of CHF exacerbation.   No outpatient medications have been marked as taking for the 07/16/20 encounter (Appointment) with Gilda Crease, Latina Craver, MD.    Past Medical History:  Diagnosis Date  . Anxiety    takes Prozac daily  . Anxiety   . Aortic valve calcification   . Asthma    Advair and Spirva daily  . Asthma   . Bipolar disorder (HCC)   . CAD (coronary artery disease)    a. LHC 11/2013 done for CP/fluid retention: mild disease in prox LAD, mild-mod disease in mRCA, EF 60% with normal LVEDP. b. Normal nuc 03/2016.  Marland Kitchen Cancer (HCC)   . CHF (congestive heart failure) (HCC)   . Chronic diastolic CHF (congestive heart failure) (HCC)   . Chronic heart failure with preserved ejection fraction (HCC) 11/16/2018  . CKD  (chronic kidney disease), stage II   . COPD (chronic obstructive pulmonary disease) (HCC)    a. nocturnal O2.  Marland Kitchen COPD (chronic obstructive pulmonary disease) (HCC)   . Coronary artery disease   . Decreased urine stream   . Diabetes mellitus   . Diabetes mellitus without complication (HCC)   . Dyspnea   . Family history of adverse reaction to anesthesia    mom gets nauseated  . GERD (gastroesophageal reflux disease)    takes Pepcid daily  . History of blood clots    left leg 3-48yrs ago  . Hyperlipidemia   . Hypertension   . Hypertriglyceridemia   . Inguinal hernia, left 01/2015  . Muscle spasm   . Open wound of genital labia   . Peripheral neuropathy   . RBBB   . Seizures (HCC)   . Sepsis (HCC) 01/19/2018  . Sinus tachycardia    a. persistent since 2009.  Marland Kitchen Smokers' cough (HCC)   . Stroke [facility]) 1989   left sided weakness  . TIA (transient ischemic attack)   . Tobacco abuse   . Vulvar abscess 01/23/2018    Past Surgical History:  Procedure Laterality Date  . APPLICATION OF WOUND VAC Right 01/29/2020   Procedure: APPLICATION OF WOUND VAC;  Surgeon: Campbell Lerner, MD;  Location: [facility] ORS;  Service: General;  Laterality: Right;  . BREAST BIOPSY Right 11/06/2019   [place] core path pending venus  clip  . HERNIA REPAIR Left   . INCISION AND DRAINAGE ABSCESS N/A 01/26/2018   Procedure: INCISION AND DEBRIDEMENT OF VULVAR NECROTIZING SOFT TISSUE INFECTION;  Surgeon: Gaynelle Adu, MD;  Location: [facility] OR;  Service: General;  Laterality: N/A;  . INCISION AND DRAINAGE PERIRECTAL ABSCESS N/A 01/22/2018   Procedure: IRRIGATION AND DEBRIDEMENT LABIAL/VULVAR AREA;  Surgeon: Abigail Miyamoto, MD;  Location: [facility] OR;  Service: General;  Laterality: N/A;  . INCISION AND DRAINAGE PERIRECTAL ABSCESS N/A 01/29/2018   Procedure: IRRIGATION AND DEBRIDEMENT VULVA;  Surgeon: Glenna Fellows, MD;  Location: [facility] OR;  Service: General;  Laterality: N/A;  . INGUINAL HERNIA REPAIR Left 04/08/2015   Procedure:  OPEN LEFT INGUINAL HERNIA REPAIR WITH MESH;  Surgeon: Axel Filler, MD;  Location: [facility] OR;  Service: General;  Laterality: Left;  . INSERTION OF MESH Left 04/08/2015   Procedure: INSERTION OF MESH;  Surgeon: Axel Filler, MD;  Location[facility] OR;  Service: General;  Laterality: Left;  . LAPAROSCOPY     Endometriosis  . LEFT HEART CATHETERIZATION WITH CORONARY ANGIOGRAM N/A 12/05/2013   Procedure: LEFT HEART CATHETERIZATION WITH CORONARY ANGIOGRAM;  Surgeon: Corky Crafts, MD;  Location: [facility] CATH LAB;  Service: Cardiovascular;  Laterality: N/A;  . right kidney drained    . SIMPLE MASTECTOMY WITH AXILLARY SENTINEL NODE BIOPSY Right 12/23/2019   Procedure: Right SIMPLE MASTECTOMY WITH AXILLARY SENTINEL NODE BIOPSY;  Surgeon: Campbell Lerner, MD;  Location: [facility] ORS;  Service: General;  Laterality: Right;  . TEE WITHOUT CARDIOVERSION N/A 11/28/2013   Procedure: TRANSESOPHAGEAL ECHOCARDIOGRAM (TEE);  Surgeon: Vesta Mixer, MD;  Location: [facility] ENDOSCOPY;  Service: Cardiovascular;  Laterality: N/A;  . WOUND DEBRIDEMENT Right 01/29/2020   Procedure: DEBRIDEMENT WOUND, Excisional debridement skin, subcutaneous and muscle right chest wall;  Surgeon: Campbell Lerner, MD;  Location: [facility] ORS;  Service: General;  Laterality: Right;    Social History Social History   Tobacco Use  . Smoking status: Current Some Day Smoker    Packs/day: 0.25    Years: 36.00    Pack years: 9.00    Types: Cigarettes    Start date: 07/11/1981  . Smokeless tobacco: Never Used  . Tobacco comment: 1 ppd now  Vaping Use  . Vaping Use: Some days  Substance Use Topics  . Alcohol use: No  . Drug use: No    Family History Family History  Problem Relation Age of Onset  . Venous thrombosis Brother   . Other Brother        BRAIN TUMOR  . Asthma Father   . Diabetes Father   . Coronary artery disease Mother   . Hypertension Mother   . Diabetes Mother   . Breast cancer Mother 75  . Asthma Sister   . Diabetes type  II Brother     Allergies  Allergen Reactions  . Adhesive [Tape] Rash and Other (See Comments)    TAKES OFF THE SKIN (CERTAIN MEDICAL TAPES DO THIS!!)  . Metoprolol Shortness Of Breath    Occurrence of shortness of breath after 3 days  . Montelukast Shortness Of Breath  . Morphine Sulfate Anaphylaxis, Shortness Of Breath and Nausea And Vomiting    Swollen Throat - Able to tolerate dilaudid  . Penicillins Anaphylaxis, Hives and Shortness Of Breath    Throat swells Has patient had a PCN reaction causing immediate rash, facial/tongue/throat swelling, SOB or lightheadedness with hypotension: Yes Has patient had a PCN reaction causing severe rash involving mucus membranes or skin necrosis: No Has patient  had a PCN reaction that required hospitalization: Yes Has patient had a PCN reaction occurring within the last 10 years: No If all of the above answers are "NO", then may proceed with Cephalosporin use.   . Prednisone Anaphylaxis  . Diltiazem Swelling  . Gabapentin Swelling  . Midodrine     Lightheaded and falling down     REVIEW OF SYSTEMS (Negative unless checked)  Constitutional: [] Weight loss  [] Fever  [] Chills Cardiac: [] Chest pain   [] Chest pressure   [] Palpitations   [] Shortness of breath when laying flat   [] Shortness of breath with exertion. Vascular:  [] Pain in legs with walking   [] Pain in legs at rest  [] History of DVT   [] Phlebitis   [x] Swelling in legs   [] Varicose veins   [x] Non-healing ulcers Pulmonary:   [] Uses home oxygen   [] Productive cough   [] Hemoptysis   [] Wheeze  [] COPD   [] Asthma Neurologic:  [] Dizziness   [] Seizures   [] History of stroke   [] History of TIA  [] Aphasia   [] Vissual changes   [] Weakness or numbness in arm   [] Weakness or numbness in leg Musculoskeletal:   [] Joint swelling   [x] Joint pain   [] Low back pain Hematologic:  [] Easy bruising  [] Easy bleeding   [] Hypercoagulable state   [] Anemic Gastrointestinal:  [] Diarrhea   [] Vomiting   [x] Gastroesophageal reflux/heartburn   [] Difficulty swallowing. Genitourinary:  [] Chronic kidney disease   [] Difficult urination  [] Frequent urination   [] Blood in urine Skin:  [x] Rashes   [x] Ulcers  Psychological:  [] History of anxiety   []  History of major depression.  Physical Examination  There were no vitals filed for this visit. There is no height or weight on file to calculate BMI. Gen: WD/WN, NAD Head: [place]/AT, No temporalis wasting.  Ear/Nose/Throat: Hearing grossly intact, nares w/o erythema or drainage Eyes: PER, EOMI, sclera nonicteric.  Neck: Supple, no large masses.   Pulmonary:  Good air movement, no audible wheezing bilaterally, no use of accessory muscles.  Cardiac: RRR, no JVD Vascular: 1-2+ edema of the left leg with severe venous changes of the left leg.  Venous ulcer noted in the shin on the left, noninfected and nearly healed Vessel Right Left  Radial Palpable Palpable  PT Palpable Palpable  DP Palpable Palpable  Gastrointestinal: Non-distended. No guarding/no peritoneal signs.  Musculoskeletal: M/S 5/5 throughout.  No deformity or atrophy.  Neurologic: CN 2-12 intact. Symmetrical.  Speech is fluent. Motor exam as listed above. Psychiatric: Judgment intact, Mood & affect appropriate for pt's clinical situation. Dermatologic: Venous stasis dermatitis with ulcers present on the left.  No changes consistent with cellulitis. Lymph : + lichenification with skin changes of chronic lymphedema.  CBC Lab Results  Component Value Date   WBC 11.4 (H) 06/29/2020   HGB 13.0 06/29/2020   HCT 40.3 06/29/2020   MCV 78.4 (L) 06/29/2020   PLT 172 06/29/2020    BMET    Component Value Date/Time   NA 134 (L) 06/29/2020 1450   NA 137 01/08/2018 1117   K 4.0 06/29/2020 1450   CL 100 06/29/2020 1450   CO2 25 06/29/2020 1450   GLUCOSE 115 (H) 06/29/2020 1450   BUN 27 (H) 06/29/2020 1450   BUN 31 (H) 01/08/2018 1117   CREATININE 1.22 (H) 06/29/2020 1450   CREATININE 1.59  (H) 07/18/2019 1506   CALCIUM 9.1 06/29/2020 1450   GFRNONAA 54 (L) 06/29/2020 1450   GFRAA 51 (L) 02/25/2020 1504   Estimated Creatinine Clearance: 70.2 mL/min (A) (by C-G formula  based on SCr of 1.22 mg/dL (H)).  COAG Lab Results  Component Value Date   INR 1.0 03/21/2019   INR 1.0 02/27/2019   INR 1.2 09/13/2018    Radiology DG Chest 2 View  Result Date: 07/01/2020 CLINICAL DATA:  cough, dyspnea, history of COPD EXAM: CHEST - 2 VIEW COMPARISON:  05/20/2020 and prior. FINDINGS: Mild hypoinflation. Hazy right basilar opacities. No pneumothorax or pleural effusion. Stable cardiomediastinal silhouette. Multilevel spondylosis. IMPRESSION: Hazy right basilar opacities, atelectasis versus infiltrate. Electronically Signed   By: Primitivo Gauze M.D.   On: 07/01/2020 13:41     Assessment/Plan 1. Venous stasis of both lower extremities No surgery or intervention at this point in time.    I have had a long discussion with the patient regarding venous insufficiency and why it  causes symptoms, specifically venous ulceration . I have discussed with the patient the chronic skin changes that accompany venous insufficiency and the long term sequela such as infection and recurring  ulceration.  Patient will be placed in [employer] which will be changed weekly drainage permitting.  In addition, behavioral modification including several periods of elevation of the lower extremities during the day will be continued. Achieving a position with the ankles at heart level was stressed to the patient  The patient is instructed to begin routine exercise, especially walking on a daily basis  Patient should undergo duplex ultrasound of the venous system to ensure that DVT or reflux is not present.  Following the review of the ultrasound the patient will follow up in one week to reassess the degree of swelling and the control that Unna therapy is offering.   The patient can be assessed for graduated  compression stockings or wraps as well as a Lymph Pump once the ulcers are healed.   2. PAD (peripheral artery disease) (HCC) Recommend:  I do not find evidence of life style limiting vascular disease. The patient specifically denies life style limitation.  Previous noninvasive studies including ABI's of the legs do not identify critical vascular problems.  The patient should continue walking and begin a more formal exercise program. The patient should continue his antiplatelet therapy and aggressive treatment of the lipid abnormalities.  The patient should begin wearing graduated compression socks 15-20 mmHg strength to control her mild edema.  3. Essential hypertension Continue antihypertensive medications as already ordered, these medications have been reviewed and there are no changes at this time.   4. Coronary artery disease involving native coronary artery of native heart without angina pectoris Continue cardiac and antihypertensive medications as already ordered and reviewed, no changes at this time.  Continue statin as ordered and reviewed, no changes at this time  Nitrates PRN for chest pain   5. Diabetes mellitus due to underlying condition with stage 2 chronic kidney disease, with long-term current use of insulin (HCC) Continue hypoglycemic medications as already ordered, these medications have been reviewed and there are no changes at this time.  Hgb A1C to be monitored as already arranged by primary service    [name], MD  07/15/2020 4:44 PM

## 2020-07-15 NOTE — Telephone Encounter (Signed)
Pt called today to ask whether or not to start medication [name]?). Pt requests a call back. [phone].

## 2020-07-16 ENCOUNTER — Ambulatory Visit
Admission: RE | Admit: 2020-07-16 | Discharge: 2020-07-16 | Disposition: A | Discharge status: Home / Self Care | Payer: BC Managed Care – PPO | Source: Ambulatory Visit | Attending: Internal Medicine | Admitting: Internal Medicine

## 2020-07-16 ENCOUNTER — Encounter (INDEPENDENT_AMBULATORY_CARE_PROVIDER_SITE_OTHER): Payer: Self-pay | Admitting: Vascular Surgery

## 2020-07-16 ENCOUNTER — Other Ambulatory Visit: Payer: Self-pay

## 2020-07-16 ENCOUNTER — Ambulatory Visit (INDEPENDENT_AMBULATORY_CARE_PROVIDER_SITE_OTHER): Payer: BC Managed Care – PPO | Admitting: Vascular Surgery

## 2020-07-16 ENCOUNTER — Ambulatory Visit
Admission: RE | Admit: 2020-07-16 | Discharge: 2020-07-16 | Disposition: A | Discharge status: Home / Self Care | Payer: BC Managed Care – PPO | Source: Ambulatory Visit | Attending: Primary Care | Admitting: Primary Care

## 2020-07-16 VITALS — BP 111/64 | HR 81 | Resp 16 | Ht 64.0 in | Wt 279.0 lb

## 2020-07-16 DIAGNOSIS — R059 Cough, unspecified: Secondary | ICD-10-CM

## 2020-07-16 DIAGNOSIS — N182 Chronic kidney disease, stage 2 (mild): Secondary | ICD-10-CM | POA: Diagnosis not present

## 2020-07-16 DIAGNOSIS — I10 Essential (primary) hypertension: Secondary | ICD-10-CM

## 2020-07-16 DIAGNOSIS — I878 Other specified disorders of veins: Secondary | ICD-10-CM

## 2020-07-16 DIAGNOSIS — Z794 Long term (current) use of insulin: Secondary | ICD-10-CM | POA: Diagnosis not present

## 2020-07-16 DIAGNOSIS — I739 Peripheral vascular disease, unspecified: Secondary | ICD-10-CM

## 2020-07-16 DIAGNOSIS — R0602 Shortness of breath: Secondary | ICD-10-CM | POA: Diagnosis not present

## 2020-07-16 DIAGNOSIS — E0822 Diabetes mellitus due to underlying condition with diabetic chronic kidney disease: Secondary | ICD-10-CM

## 2020-07-16 DIAGNOSIS — I2510 Atherosclerotic heart disease of native coronary artery without angina pectoris: Secondary | ICD-10-CM | POA: Diagnosis not present

## 2020-07-16 NOTE — Telephone Encounter (Signed)
Patient asking if she should go ahead and start on the Arimidex. I explained to patient that her lupron was denied and our NPs are working on a peer to peer with her insurance. She has the Arimidex and is eager to get started on the oral tablet. She was waiting to start the Arimidex on the same day as the lupron per her discussion with Dr. Donneta Romberg.  Mick Sell, Please advise on the Arimidex.

## 2020-07-16 NOTE — Telephone Encounter (Signed)
Left ANOTHER VM. I expressed urgency and gave her 2 separate #'s to call me back.

## 2020-07-16 NOTE — Telephone Encounter (Signed)
Can you find that number for me to call to get lupron approved? I can't find it  Durenda Hurt, NP 07/16/2020 2:38 PM

## 2020-07-16 NOTE — Telephone Encounter (Signed)
The number is [phone] extension [phone].

## 2020-07-16 NOTE — Telephone Encounter (Signed)
yes

## 2020-07-16 NOTE — Telephone Encounter (Signed)
Jenny/Lauren - please review. May patient start on the AI without the Lupron? Patient is asking.

## 2020-07-16 NOTE — Telephone Encounter (Signed)
Can patient start on Arimidex without the Lupron?

## 2020-07-16 NOTE — Telephone Encounter (Signed)
Any updates on peer to peer? Patient has called and I would like to update her on the process.

## 2020-07-17 ENCOUNTER — Other Ambulatory Visit: Payer: Self-pay | Admitting: Primary Care

## 2020-07-17 DIAGNOSIS — J189 Pneumonia, unspecified organism: Secondary | ICD-10-CM

## 2020-07-17 MED ORDER — PROMETHAZINE-DM 6.25-15 MG/5ML PO SYRP: 5.0000 mL | ORAL_SOLUTION | Freq: Two times a day (BID) | ORAL | 0 refills | Status: DC | PRN

## 2020-07-17 MED ORDER — AZITHROMYCIN 250 MG PO TABS: ORAL_TABLET | ORAL | 0 refills | Status: DC

## 2020-07-17 NOTE — Telephone Encounter (Signed)
Left VM for Helene Kelp at [phone] extension [phone]. I left a vm for Helene Kelp to contact our office to arrange for peer to peer.

## 2020-07-17 NOTE — Telephone Encounter (Signed)
Spoke with patient. Instructed patient to start on Arimidex. Sonia Baller, I can also attempt to call the # for peer to peer and leave a vm. We also have Dr. B write an appeal letter if needed next week.

## 2020-07-18 DIAGNOSIS — J449 Chronic obstructive pulmonary disease, unspecified: Secondary | ICD-10-CM | POA: Diagnosis not present

## 2020-07-19 NOTE — Telephone Encounter (Signed)
Thank you Heather. I also called and left a VM again on Friday.   Faythe Casa, NP 07/19/2020 9:50 AM

## 2020-07-21 ENCOUNTER — Telehealth: Payer: Self-pay | Admitting: Primary Care

## 2020-07-21 DIAGNOSIS — R059 Cough, unspecified: Secondary | ICD-10-CM

## 2020-07-21 NOTE — Telephone Encounter (Signed)
Patient called in stating she needs to speak with CMA of [name]. Patient stated [facility] told nurse to be expecting her call. Please advise. No documentation.

## 2020-07-22 ENCOUNTER — Ambulatory Visit (INDEPENDENT_AMBULATORY_CARE_PROVIDER_SITE_OTHER): Payer: BC Managed Care – PPO | Admitting: Nurse Practitioner

## 2020-07-22 ENCOUNTER — Encounter: Payer: Self-pay | Admitting: *Deleted

## 2020-07-22 ENCOUNTER — Other Ambulatory Visit: Payer: Self-pay | Admitting: Primary Care

## 2020-07-22 ENCOUNTER — Ambulatory Visit
Admission: RE | Admit: 2020-07-22 | Discharge: 2020-07-22 | Disposition: A | Discharge status: Home / Self Care | Payer: BC Managed Care – PPO | Source: Ambulatory Visit | Attending: Primary Care | Admitting: Primary Care

## 2020-07-22 ENCOUNTER — Other Ambulatory Visit: Payer: BC Managed Care – PPO

## 2020-07-22 ENCOUNTER — Other Ambulatory Visit: Payer: Self-pay

## 2020-07-22 ENCOUNTER — Encounter (INDEPENDENT_AMBULATORY_CARE_PROVIDER_SITE_OTHER): Payer: Self-pay | Admitting: Nurse Practitioner

## 2020-07-22 VITALS — BP 140/79 | HR 91 | Ht 64.0 in | Wt 265.0 lb

## 2020-07-22 DIAGNOSIS — I700 Atherosclerosis of aorta: Secondary | ICD-10-CM | POA: Diagnosis not present

## 2020-07-22 DIAGNOSIS — R059 Cough, unspecified: Secondary | ICD-10-CM

## 2020-07-22 DIAGNOSIS — R918 Other nonspecific abnormal finding of lung field: Secondary | ICD-10-CM | POA: Diagnosis not present

## 2020-07-22 DIAGNOSIS — J984 Other disorders of lung: Secondary | ICD-10-CM | POA: Diagnosis not present

## 2020-07-22 DIAGNOSIS — I83023 Varicose veins of left lower extremity with ulcer of ankle: Secondary | ICD-10-CM

## 2020-07-22 DIAGNOSIS — L97329 Non-pressure chronic ulcer of left ankle with unspecified severity: Secondary | ICD-10-CM

## 2020-07-22 MED ORDER — IOHEXOL 300 MG/ML  SOLN
75.0000 mL | Freq: Once | INTRAMUSCULAR | Status: AC | PRN
  Administered 2020-07-22: 75 mL via INTRAVENOUS

## 2020-07-22 NOTE — Telephone Encounter (Signed)
We need to obtain a stat CT chest. [name], FYI.  [name], can you notify patient that this is being ordered? Has she been checked for Covid? We need to test her ASAP if not.

## 2020-07-22 NOTE — Telephone Encounter (Signed)
Appeals letter written and faxed to patient's insurance.

## 2020-07-22 NOTE — Telephone Encounter (Signed)
P2P is not available due to the denial is deemed investigational or experimental. The only option is an appeal. The appeal will need to be fax to [phone], please add URGENT and the case# to the cover sheet. Urgent Appeal can take up to 15 days.

## 2020-07-22 NOTE — Telephone Encounter (Signed)
Patient is on her way to [facility] for the CT scan. Patient tested for COVID today.

## 2020-07-22 NOTE — Telephone Encounter (Signed)
Called patient will come in today for covid testing. Aware she will get a call for Ct

## 2020-07-22 NOTE — Progress Notes (Signed)
History of Present Illness  There is no documented history at this time  Assessments & Plan   There are no diagnoses linked to this encounter.    Additional instructions  Subjective:  Patient presents with venous ulcer of the Bilateral lower extremity.    Procedure:  3 layer unna wrap was placed Bilateral lower extremity.   Plan:   Follow up in one week.  added xeroform, Calamine wrap and coban to the Right leg per NP for wound

## 2020-07-22 NOTE — Telephone Encounter (Signed)
Called and spoke with patient who stated that she is not feeling any better despite the antibiotics and cough syrup that was prescribed. Patient stated that she still has a cough and mild SOB when she ambulates. Patient denies having fever. Please advise.

## 2020-07-23 ENCOUNTER — Telehealth: Payer: Self-pay | Admitting: *Deleted

## 2020-07-23 ENCOUNTER — Other Ambulatory Visit: Payer: Self-pay

## 2020-07-23 ENCOUNTER — Telehealth: Payer: Self-pay | Admitting: Internal Medicine

## 2020-07-23 ENCOUNTER — Other Ambulatory Visit: Payer: Self-pay | Admitting: Primary Care

## 2020-07-23 ENCOUNTER — Telehealth: Payer: Self-pay | Admitting: Primary Care

## 2020-07-23 ENCOUNTER — Other Ambulatory Visit: Payer: BC Managed Care – PPO

## 2020-07-23 ENCOUNTER — Encounter: Payer: Self-pay | Admitting: Primary Care

## 2020-07-23 DIAGNOSIS — U071 COVID-19: Secondary | ICD-10-CM

## 2020-07-23 DIAGNOSIS — R059 Cough, unspecified: Secondary | ICD-10-CM

## 2020-07-23 NOTE — Telephone Encounter (Signed)
Received incoming call from [employer]itt [employer] from [place]. Patient's Lupron appeal was upheld as "experimental and investigational." [phone]

## 2020-07-23 NOTE — Telephone Encounter (Signed)
Patient states that she had a ct scan done yesterday. She is wanting you to read it and let her know the results. She is also wanting her covid screening results sent to her. EM

## 2020-07-23 NOTE — Telephone Encounter (Signed)
Noted  

## 2020-07-23 NOTE — Telephone Encounter (Signed)
Called patient reviewed all information and repeated back to me. Will call if any questions.  Had repeat back to me. Will go to ED if any change or lack of improvement. She would like to have infusion. States that the weakness and increase in symptoms started on 06/18/2020. But has had cough for several weeks.   Would like note that she has tested positive for her husband to give to work for documentation. I have gotten letter printed and given to today's tester. She will call back door number when arrives and pull to back to get letter.

## 2020-07-23 NOTE — Telephone Encounter (Signed)
Please notify patient that she tested positive for Covid-19.  Please cancel lab appointment.  We will try to refer her for monoclonal antibody infusion, but I believe she is outside the treatment window.   If she is not improving then she needs to go to the hospital for further evaluation and monitoring.  Aaron Edelman, I just received this message after I sent you her CT chest report. Just FYI.

## 2020-07-23 NOTE — Telephone Encounter (Signed)
[name] with [facility] said that pt tested + for covid on 07/22/20; pt has requested cb with results from CT of chest and pt has one tab of Azithromycin left for 07/24/20 and pt wants to know if will need to continue abx. [name] also request to call pt with above info and [name] also request cb after Gentry Fitz NP has reviewed CT of chest to know what plan of care is for pt.

## 2020-07-23 NOTE — Telephone Encounter (Signed)
Noted. I just contacted a MAB team member and she stated that [name] is out of the treatment window MAB. I have notified her pulmonology team, they are aware. Have her reach out to them for guidance as well.

## 2020-07-23 NOTE — Telephone Encounter (Signed)
Called patient they have received call from pulmonology and have appointment set up with them.

## 2020-07-23 NOTE — Telephone Encounter (Signed)
On 01/13- spoke to insurance physician re: regarding the rationale for use of Eligard.  Ovarian suppression along with AI in premenopausal women with breast cancer.

## 2020-07-24 ENCOUNTER — Other Ambulatory Visit: Payer: Self-pay | Admitting: Primary Care

## 2020-07-24 ENCOUNTER — Telehealth: Payer: Self-pay

## 2020-07-24 DIAGNOSIS — J189 Pneumonia, unspecified organism: Secondary | ICD-10-CM

## 2020-07-24 DIAGNOSIS — J441 Chronic obstructive pulmonary disease with (acute) exacerbation: Secondary | ICD-10-CM

## 2020-07-24 DIAGNOSIS — R059 Cough, unspecified: Secondary | ICD-10-CM

## 2020-07-24 MED ORDER — PROMETHAZINE-DM 6.25-15 MG/5ML PO SYRP: 5.0000 mL | ORAL_SOLUTION | Freq: Two times a day (BID) | ORAL | 0 refills | Status: DC | PRN

## 2020-07-24 MED ORDER — BENZONATATE 200 MG PO CAPS
200.0000 mg | ORAL_CAPSULE | Freq: Three times a day (TID) | ORAL | 0 refills | Status: DC | PRN
Start: 2020-07-24 — End: 2020-11-16

## 2020-07-24 NOTE — Telephone Encounter (Signed)
Patient called office has finished her cough medicine that was given recently. She is still having cough would like refill.

## 2020-07-24 NOTE — Telephone Encounter (Signed)
Called patient did not answer. Left message to let her now that new meds were called in.

## 2020-07-24 NOTE — Telephone Encounter (Signed)
Noted, refill provided for [employer]

## 2020-07-24 NOTE — Telephone Encounter (Signed)
Pt called in she is needed the cough syrup and not pills

## 2020-07-26 LAB — NOVEL CORONAVIRUS, NAA: SARS-CoV-2, NAA: NOT DETECTED

## 2020-07-29 ENCOUNTER — Other Ambulatory Visit: Payer: Self-pay

## 2020-07-29 ENCOUNTER — Ambulatory Visit (INDEPENDENT_AMBULATORY_CARE_PROVIDER_SITE_OTHER): Payer: BC Managed Care – PPO | Admitting: Nurse Practitioner

## 2020-07-29 VITALS — BP 123/75 | HR 85 | Ht 64.0 in | Wt 264.0 lb

## 2020-07-29 DIAGNOSIS — L97329 Non-pressure chronic ulcer of left ankle with unspecified severity: Secondary | ICD-10-CM

## 2020-07-29 DIAGNOSIS — I83023 Varicose veins of left lower extremity with ulcer of ankle: Secondary | ICD-10-CM

## 2020-07-29 NOTE — Progress Notes (Signed)
Pt came out of wraps per the NP.

## 2020-08-03 ENCOUNTER — Encounter (HOSPITAL_COMMUNITY): Payer: Self-pay | Admitting: *Deleted

## 2020-08-03 ENCOUNTER — Other Ambulatory Visit: Payer: Self-pay

## 2020-08-03 ENCOUNTER — Telehealth: Payer: Self-pay

## 2020-08-03 ENCOUNTER — Emergency Department (HOSPITAL_COMMUNITY)
Admission: EM | Admit: 2020-08-03 | Discharge: 2020-08-03 | Disposition: A | Discharge status: Home / Self Care | Payer: BC Managed Care – PPO | Attending: Emergency Medicine | Admitting: Emergency Medicine

## 2020-08-03 DIAGNOSIS — R531 Weakness: Secondary | ICD-10-CM | POA: Insufficient documentation

## 2020-08-03 DIAGNOSIS — Z8673 Personal history of transient ischemic attack (TIA), and cerebral infarction without residual deficits: Secondary | ICD-10-CM | POA: Insufficient documentation

## 2020-08-03 DIAGNOSIS — Z5321 Procedure and treatment not carried out due to patient leaving prior to being seen by health care provider: Secondary | ICD-10-CM | POA: Insufficient documentation

## 2020-08-03 DIAGNOSIS — E1165 Type 2 diabetes mellitus with hyperglycemia: Secondary | ICD-10-CM | POA: Diagnosis not present

## 2020-08-03 DIAGNOSIS — R0902 Hypoxemia: Secondary | ICD-10-CM | POA: Diagnosis not present

## 2020-08-03 LAB — CBC
HCT: 46.7 % — ABNORMAL HIGH (ref 36.0–46.0)
Hemoglobin: 14.2 g/dL (ref 12.0–15.0)
MCH: 25.8 pg — ABNORMAL LOW (ref 26.0–34.0)
MCHC: 30.4 g/dL (ref 30.0–36.0)
MCV: 84.8 fL (ref 80.0–100.0)
Platelets: 173 10*3/uL (ref 150–400)
RBC: 5.51 MIL/uL — ABNORMAL HIGH (ref 3.87–5.11)
RDW: 19.8 % — ABNORMAL HIGH (ref 11.5–15.5)
WBC: 8.8 10*3/uL (ref 4.0–10.5)
nRBC: 0 % (ref 0.0–0.2)

## 2020-08-03 LAB — BASIC METABOLIC PANEL
Anion gap: 10 (ref 5–15)
BUN: 28 mg/dL — ABNORMAL HIGH (ref 6–20)
CO2: 27 mmol/L (ref 22–32)
Calcium: 9.4 mg/dL (ref 8.9–10.3)
Chloride: 98 mmol/L (ref 98–111)
Creatinine, Ser: 1.27 mg/dL — ABNORMAL HIGH (ref 0.44–1.00)
GFR, Estimated: 51 mL/min — ABNORMAL LOW (ref >60)
Glucose, Bld: 343 mg/dL — ABNORMAL HIGH (ref 70–99)
Potassium: 4.8 mmol/L (ref 3.5–5.1)
Sodium: 135 mmol/L (ref 135–145)

## 2020-08-03 LAB — I-STAT BETA HCG BLOOD, ED (MC, WL, AP ONLY): I-stat hCG, quantitative: 5.7 m[IU]/mL — ABNORMAL HIGH (ref <5)

## 2020-08-03 NOTE — Telephone Encounter (Signed)
Patient checked into the ED and left before being seen. Can we call to check on patient on 08/04/2020? Does she have a neurologist?

## 2020-08-03 NOTE — ED Notes (Signed)
Pt stated that her ride was on the way to pick her up. Pt did not want to wait. IV has been taken out

## 2020-08-03 NOTE — Telephone Encounter (Signed)
[facility] - Client TELEPHONE ADVICE RECORD [facility] Patient Name: [name] Gender: Female DOB: 23-Nov-1970 Age: 50 Y 2 M 11 D Return Phone Number: [phone] (Primary) Address: City/State/Zip: [place] [address] Client [facility] - Client Client Site [place] - Day Physician Alma Friendly - NP Contact Type Call Who Is Calling Patient / Member / Family / Caregiver Call Type Triage / Clinical Relationship To Patient Spouse Return Phone Number [phone] (Primary) Chief Complaint NUMBNESS/TINGLING- sudden on one side of the body or face Reason for Call Symptomatic / Request for [place] states thinks may have had stroke; entire left arm numb for a couple of days and unable to focus. Translation No Nurse Assessment Nurse: Chestine Spore, RN, [place] Date/Time (Eastern Time): 08/03/2020 10:52:51 AM Confirm and document reason for call. If symptomatic, describe symptoms. ---caller states wife having numbness in arm and leg. left. hard to focus Does the patient have any new or worsening symptoms? ---Yes Will a triage be completed? ---Yes Related visit to physician within the last 2 weeks? ---No Does the PT have any chronic conditions? (i.e. diabetes, asthma, this includes High risk factors for pregnancy, etc.) ---Yes List chronic conditions. ---asthma. diabetes Is the patient pregnant or possibly pregnant? (Ask all females between the ages of 1-55) ---No Is this a behavioral health or substance abuse call? ---No Guidelines Guideline Title Affirmed Question Affirmed Notes Nurse Date/Time (Eastern Time) Neurologic Deficit [1] Numbness (i.e., loss of sensation) of the face, arm / hand, or leg / foot on one side of the body AND [2] sudden onset AND [3] present now [place], [place], [place] 08/03/2020 10:54:22 AM Disp. Time Eilene Ghazi Time) Disposition Final  User 08/03/2020 10:50:42 AM Send to Urgent Queue Jerrye Beavers 08/03/2020 10:58:12 AM 911 Outcome Documentation Matherly, RN, Reynold Bowen PLEASE NOTE: All timestamps contained within this report are represented as [place] Standard Time. CONFIDENTIALTY NOTICE: This fax transmission is intended only for the addressee. It contains information that is legally privileged, confidential or otherwise protected from use or disclosure. If you are not the intended recipient, you are strictly prohibited from reviewing, disclosing, copying using or disseminating any of this information or taking any action in reliance on or regarding this information. If you have received this fax in error, please notify us immediately by telephone so that we can arrange for its return to [place]. Phone: [phone], Toll-Free: [phone], Fax: [phone] Page: 2 of 2 Call Id: [id number] [place]. Time Eilene Ghazi Time) Disposition Final User Reason: calling 911 08/03/2020 10:57:28 AM Call EMS 911 Now Yes Chestine Spore, RN, Silvestre Moment Disagree/Comply Comply Caller Understands Yes PreDisposition Call Doctor Care Advice Given Per Guideline CALL EMS 911 NOW: * Immediate medical attention is needed. You need to hang up and call 911 (or an ambulance). CARE ADVICE given per Neurologic Deficit (Adult) guideline. Referrals GO TO FACILITY UNDECIDED

## 2020-08-03 NOTE — ED Triage Notes (Signed)
Pt arrived by [facility] from home. Pt reports hx of stroke in Nov. Reports left side weakness from the stroke but had increase in weakness yesterday and reports its getting progressively worse. Reports being covid + last week and requesting to be rechecked.

## 2020-08-04 ENCOUNTER — Encounter (INDEPENDENT_AMBULATORY_CARE_PROVIDER_SITE_OTHER): Payer: Self-pay | Admitting: Nurse Practitioner

## 2020-08-04 ENCOUNTER — Encounter (HOSPITAL_COMMUNITY): Payer: Self-pay | Admitting: Emergency Medicine

## 2020-08-04 ENCOUNTER — Emergency Department (HOSPITAL_COMMUNITY)
Admission: EM | Admit: 2020-08-04 | Discharge: 2020-08-04 | Disposition: A | Discharge status: Home / Self Care | Payer: BC Managed Care – PPO | Attending: Emergency Medicine | Admitting: Emergency Medicine

## 2020-08-04 ENCOUNTER — Telehealth: Payer: Self-pay | Admitting: Primary Care

## 2020-08-04 DIAGNOSIS — R4781 Slurred speech: Secondary | ICD-10-CM | POA: Insufficient documentation

## 2020-08-04 DIAGNOSIS — Z5321 Procedure and treatment not carried out due to patient leaving prior to being seen by health care provider: Secondary | ICD-10-CM | POA: Insufficient documentation

## 2020-08-04 DIAGNOSIS — R29810 Facial weakness: Secondary | ICD-10-CM | POA: Diagnosis not present

## 2020-08-04 DIAGNOSIS — E1165 Type 2 diabetes mellitus with hyperglycemia: Secondary | ICD-10-CM | POA: Diagnosis not present

## 2020-08-04 DIAGNOSIS — I959 Hypotension, unspecified: Secondary | ICD-10-CM | POA: Diagnosis not present

## 2020-08-04 DIAGNOSIS — G8190 Hemiplegia, unspecified affecting unspecified side: Secondary | ICD-10-CM | POA: Diagnosis not present

## 2020-08-04 DIAGNOSIS — M6281 Muscle weakness (generalized): Secondary | ICD-10-CM | POA: Insufficient documentation

## 2020-08-04 IMAGING — US US BREAST*R* LIMITED INC AXILLA
1 series · 9 of 9 positions shown · non-contrast
Comparison: CT angio chest September 2019.

CLINICAL DATA: 48-year-old patient recently had a CT angio chest
September 23, 2019 which described a 2.3 cm mass in the lower right
breast. Patient presents today for evaluation of this mass. She
describes having burning pain in the inferior right breast. This is
her baseline mammogram. Her mother has a history of breast cancer at
50. The patient is in a wheelchair and unable to stand for today's
images.

EXAM:
DIGITAL DIAGNOSTIC BILATERAL MAMMOGRAM WITH CAD AND TOMO
ULTRASOUND RIGHT BREAST

[Series 1: us breast*right* limited inc axilla · 0.06mm/px · 9 of 9 slices shown]
[im 1/9]
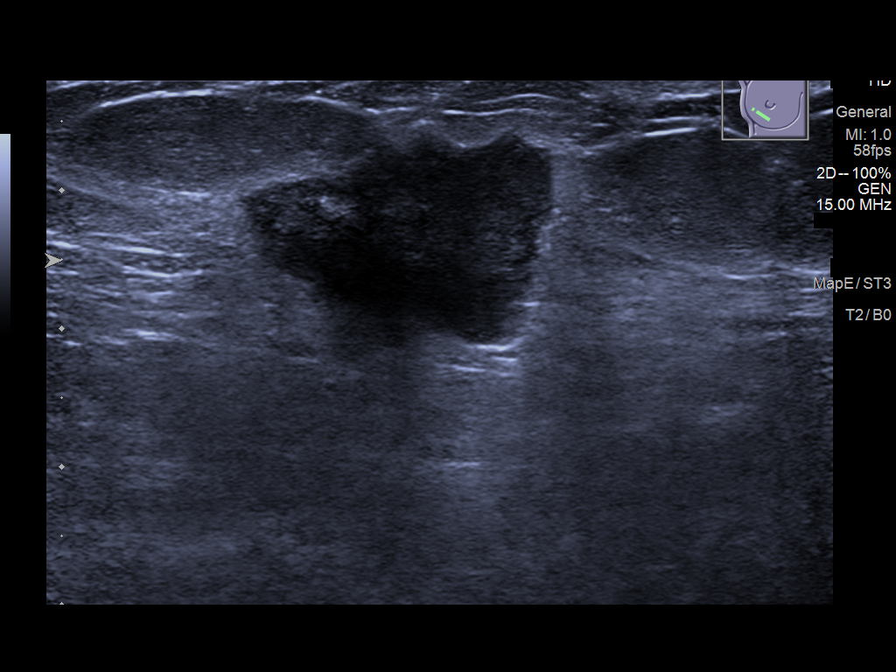
[im 2/9]
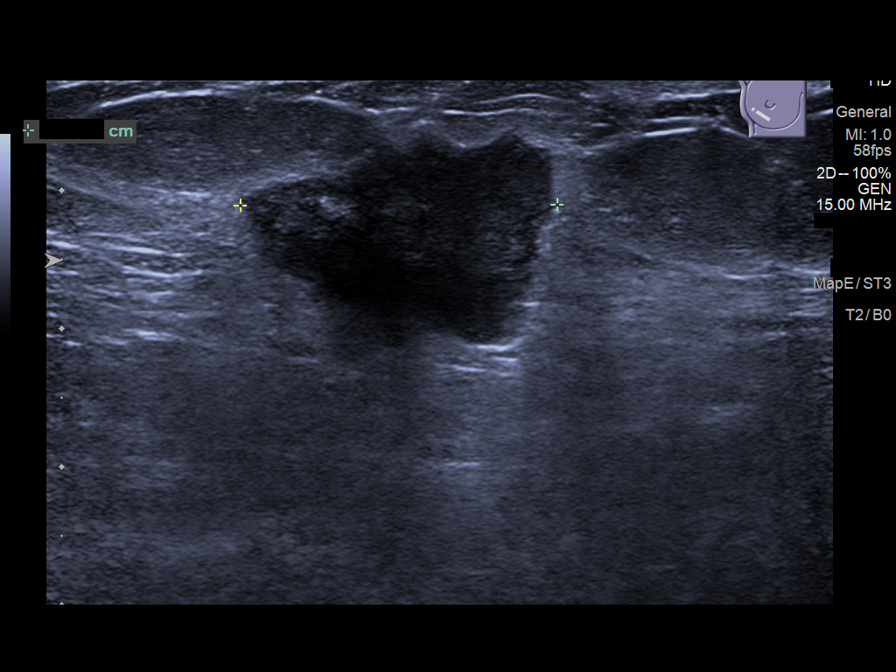
[im 3/9]
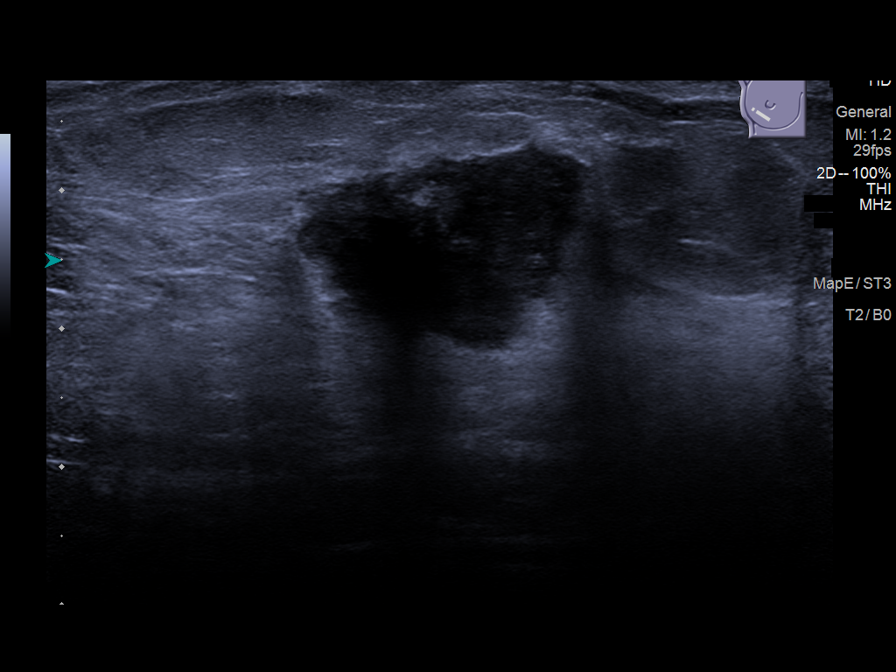
[im 4/9]
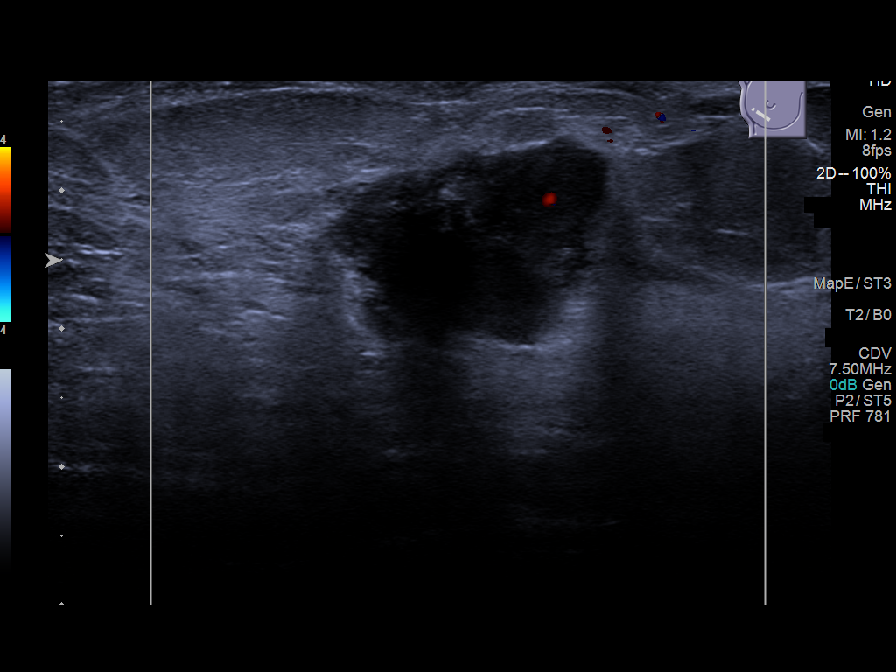
[im 5/9]
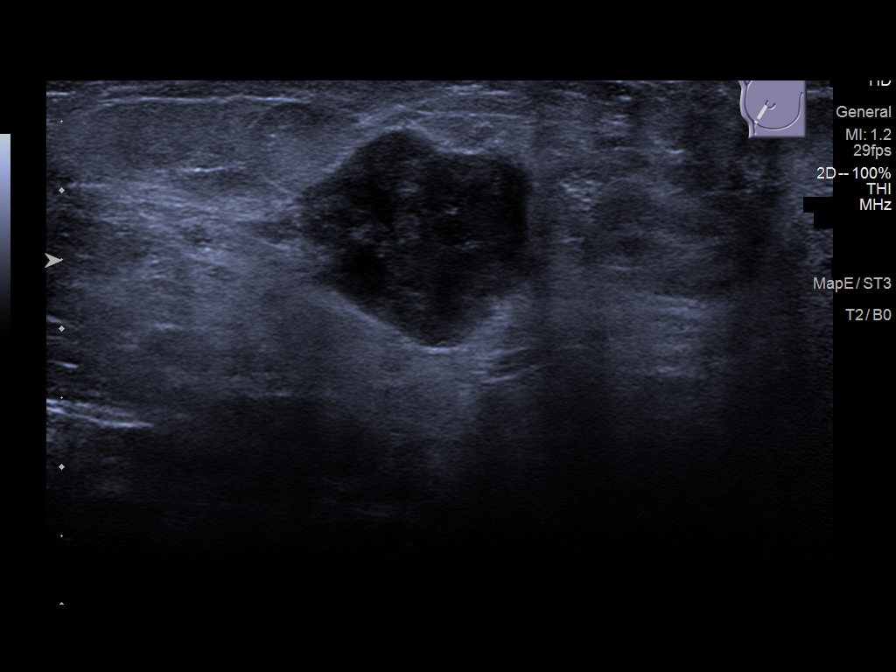
[im 6/9]
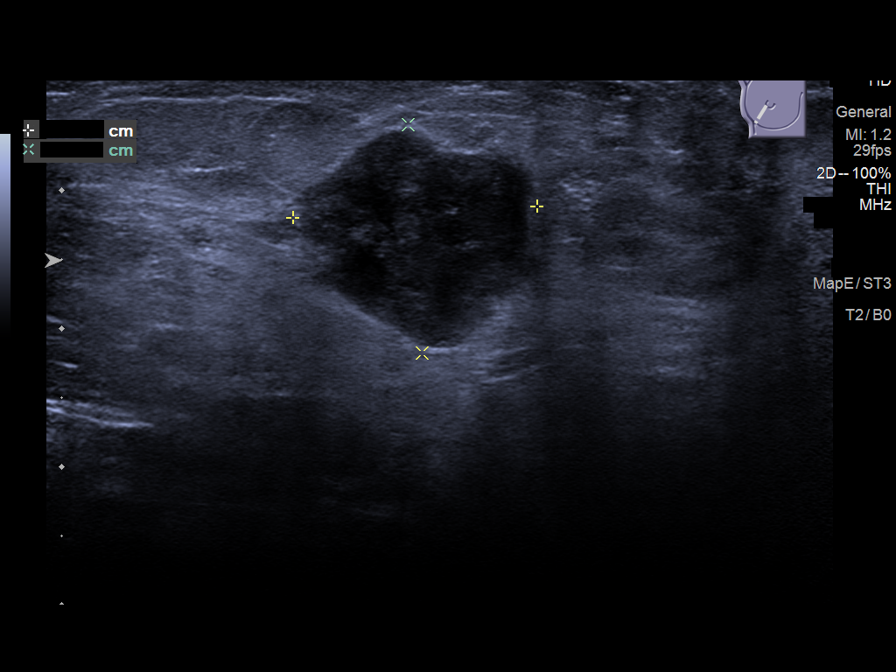
[im 7/9]
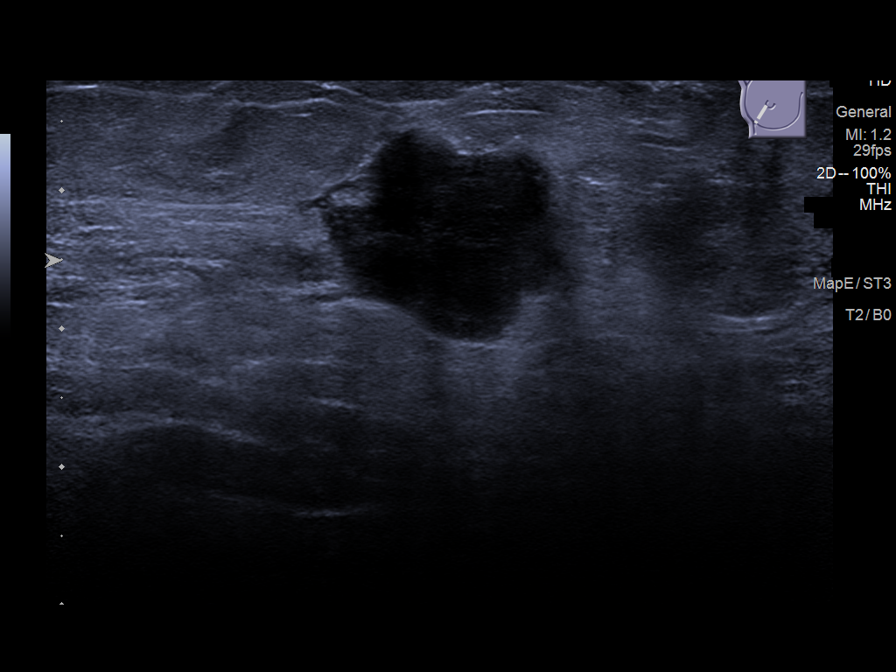
[im 8/9]
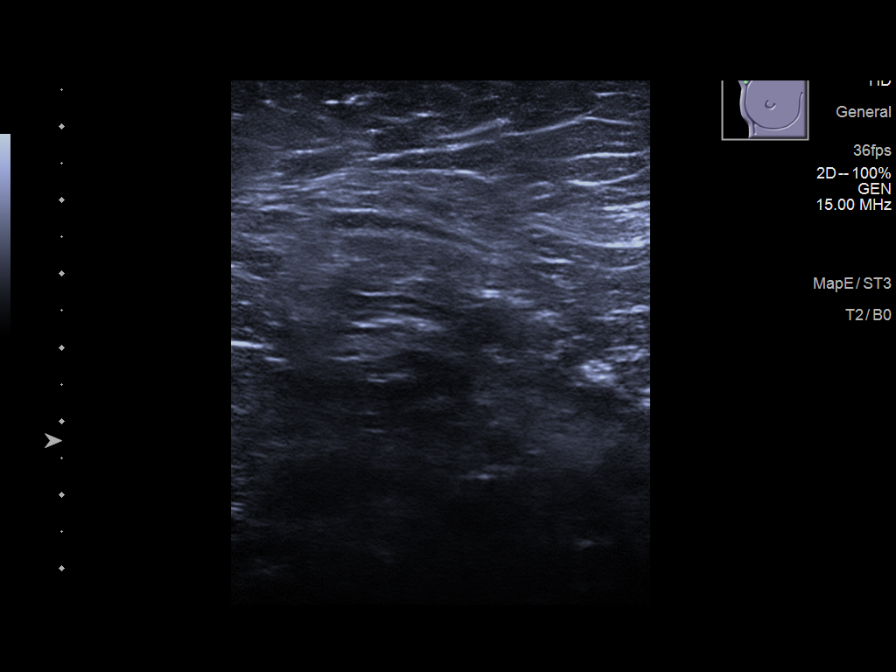
[im 9/9]
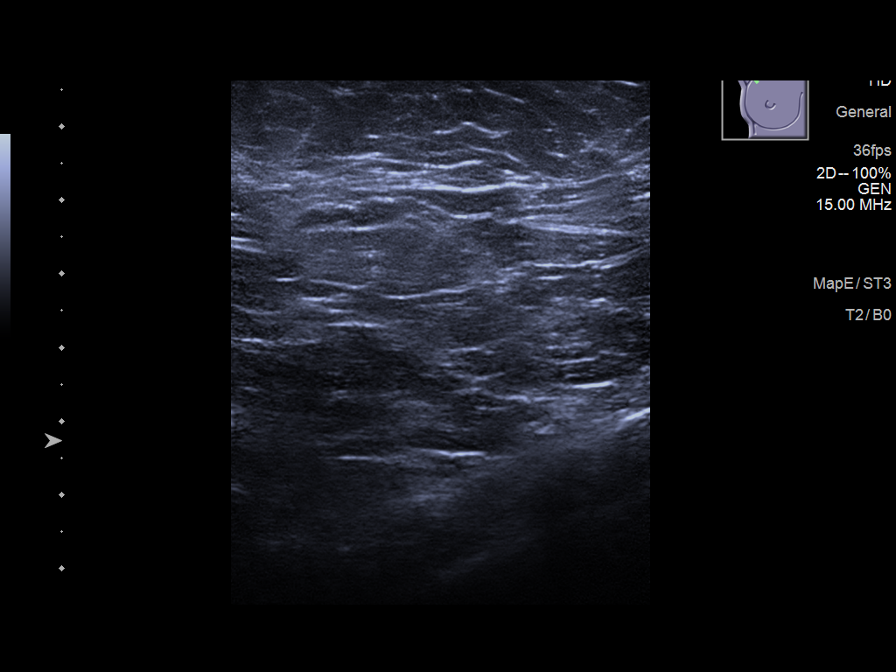

[9 of 9 positions shown; findings below may reference images not displayed]

No prior mammograms.

ACR Breast Density Category b: There are scattered areas of
fibroglandular density.
FINDINGS: Some of the posterior breast parenchyma and axillary regions are
excluded from the images due to patient in seated position in a
wheelchair.

Irregular mass in the middle to posterior third of the inferior
central right breast. There are a few calcifications within the
right breast mass. No additional suspicious microcalcifications,
additional mass, or distortion is identified in the right breast.

Skin thickness appears normal.

There are no findings suspicious for malignancy on the left.

Mammographic images were processed with CAD.

On physical exam, there is a firm palpable mass approximately 2 cm
from the nipple in the 7 o'clock axis of the right breast. On
physical exam, the mass is approximately 2 cm in size.

Targeted ultrasound is performed, showing a hypoechoic irregular
mass with internal calcification is identified at approximately 7
o'clock position 2-3 cm from the nipple. Mass measures 2.3 x 1.8 x
1.7 cm. A focus of internal vascular flow is identified.

Ultrasound survey of the right axilla shows normal lymph nodes. No
lymphadenopathy detected in the right axilla.
IMPRESSION: Suspicious 2.3 cm mass in the 7 o'clock position of the right
breast. The mass is palpable.

No evidence of malignancy in the left breast.

Mammographic evaluation limited by wheelchair status.

RECOMMENDATION:
Ultrasound-guided core needle biopsy of the right breast mass is
being scheduled for the patient. The procedure of biopsy was
discussed in detail with the patient and her husband today.

I have discussed the findings and recommendations with the patient.
If applicable, a reminder letter will be sent to the patient
regarding the next appointment.

BI-RADS CATEGORY  5: Highly suggestive of malignancy.

## 2020-08-04 IMAGING — MG DIGITAL DIAGNOSTIC BILAT W/ TOMO W/ CAD
8 of 14 series · 8 of 40 positions shown · non-contrast
Comparison: CT angio chest September 2019.

CLINICAL DATA: 48-year-old patient recently had a CT angio chest
September 23, 2019 which described a 2.3 cm mass in the lower right
breast. Patient presents today for evaluation of this mass. She
describes having burning pain in the inferior right breast. This is
her baseline mammogram. Her mother has a history of breast cancer at
50. The patient is in a wheelchair and unable to stand for today's
images.

EXAM:
DIGITAL DIAGNOSTIC BILATERAL MAMMOGRAM WITH CAD AND TOMO
ULTRASOUND RIGHT BREAST

[L MLO synth-2D (1 of 2)]
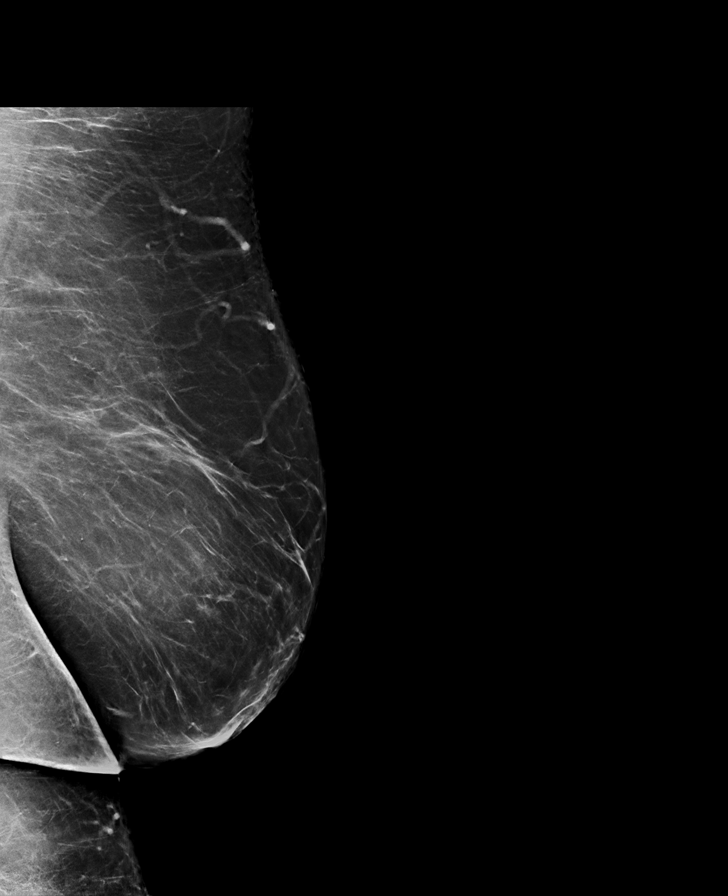

[L CC synth-2D]
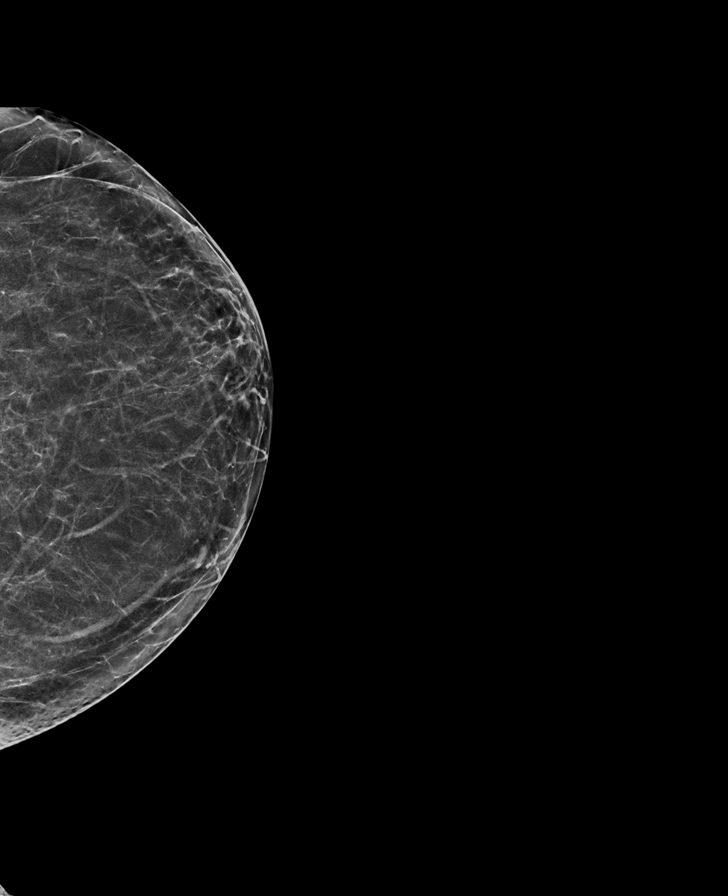

[L MLO synth-2D (2 of 2)]
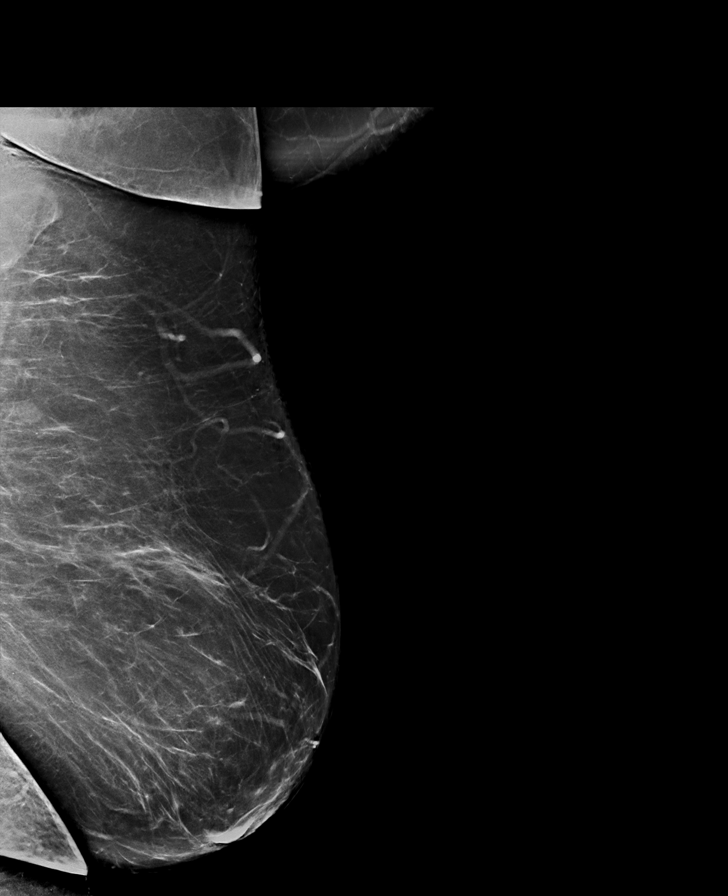

[R CC synth-2D]
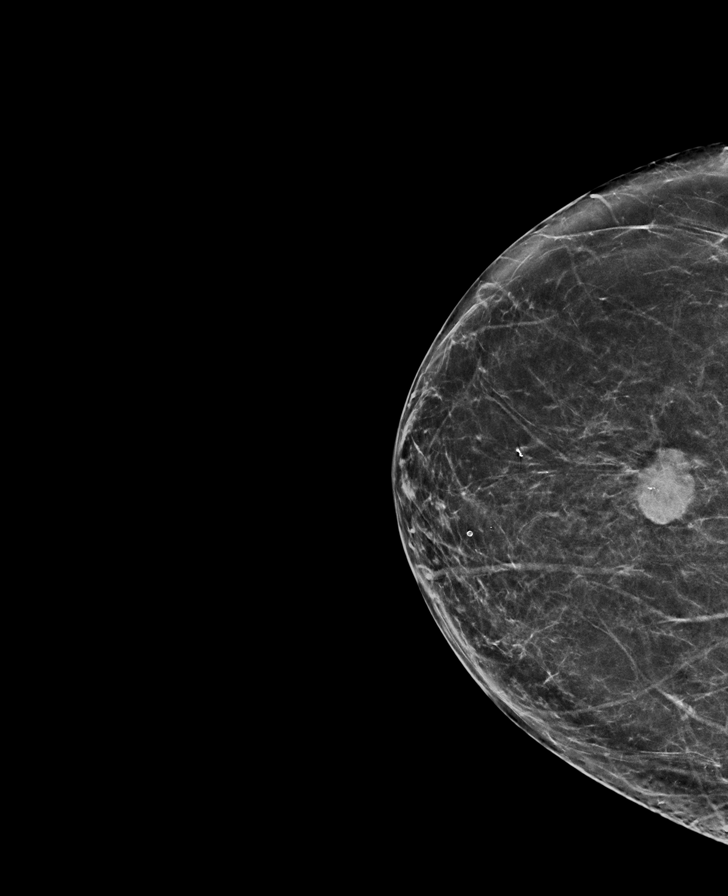

[L XCCL synth-2D]
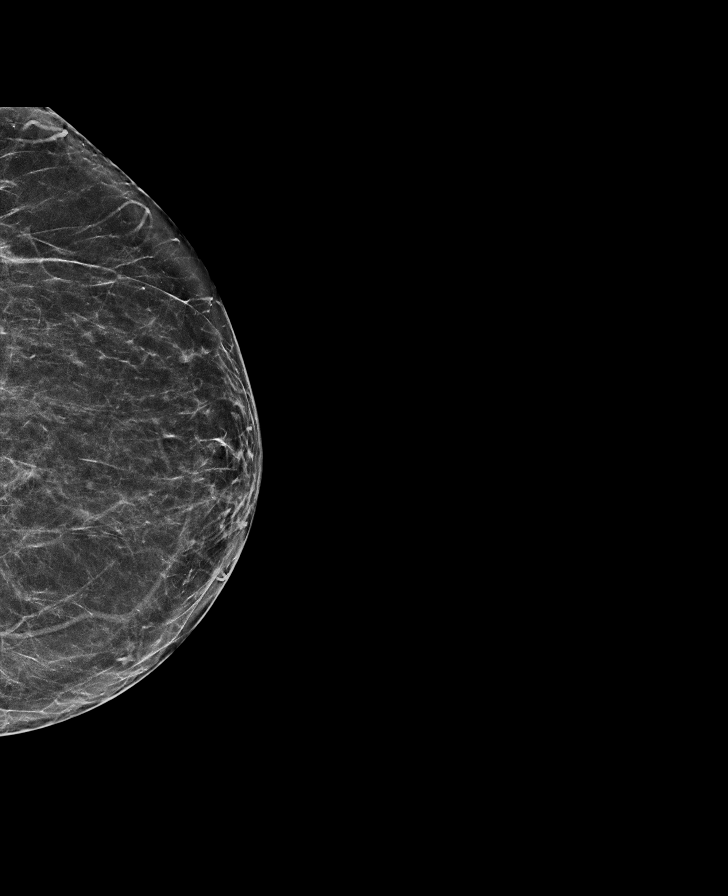

[R MLO synth-2D (1 of 2)]
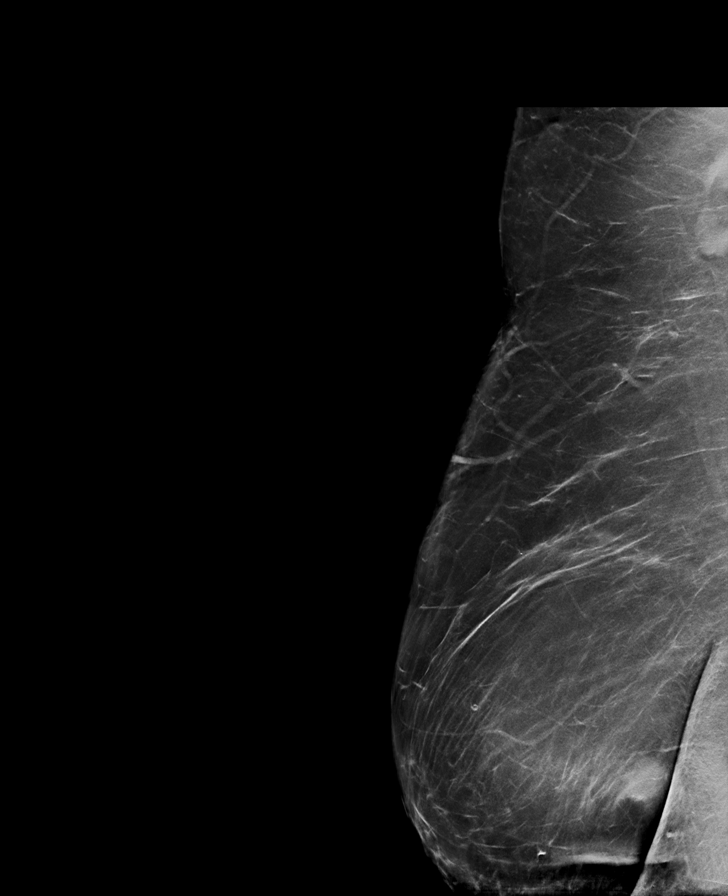

[R MLO synth-2D (2 of 2)]
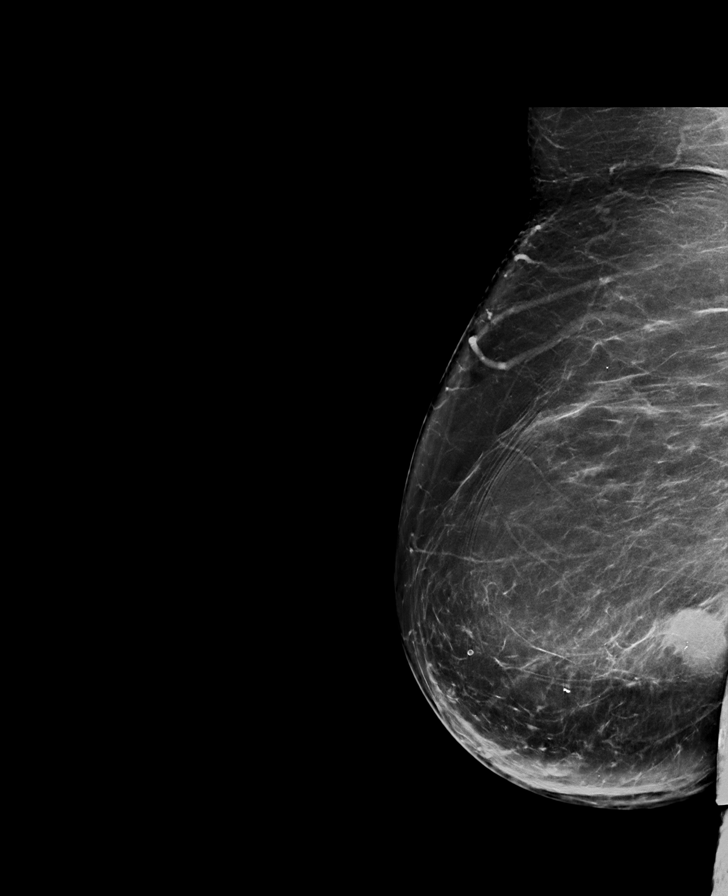

[L XCCL tomo · tomo slice 34/67.0]
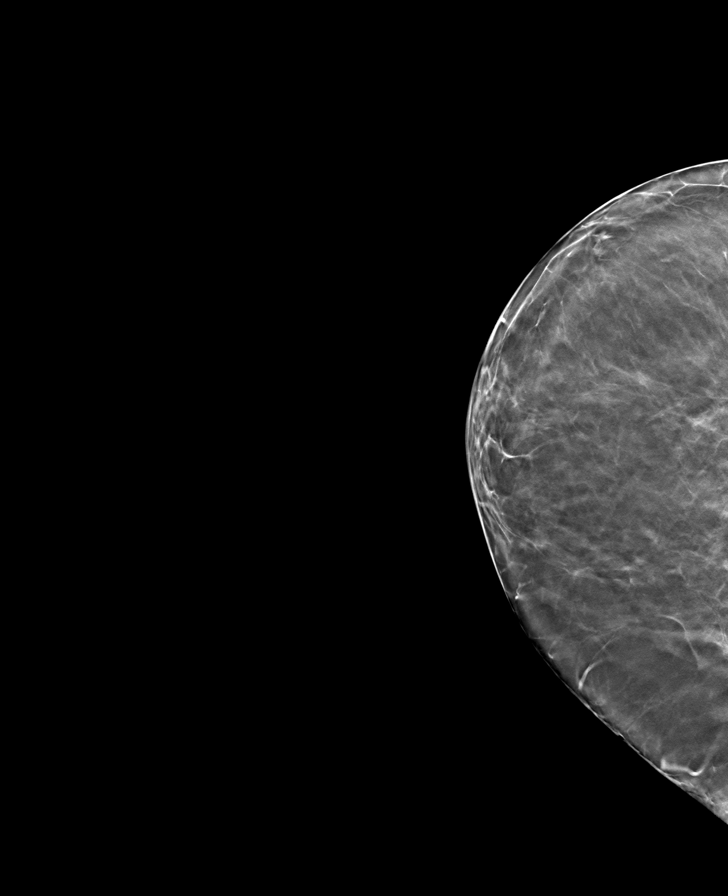

[8 of 40 positions shown; findings below may reference images not displayed]

No prior mammograms.

ACR Breast Density Category b: There are scattered areas of
fibroglandular density.
FINDINGS: Some of the posterior breast parenchyma and axillary regions are
excluded from the images due to patient in seated position in a
wheelchair.

Irregular mass in the middle to posterior third of the inferior
central right breast. There are a few calcifications within the
right breast mass. No additional suspicious microcalcifications,
additional mass, or distortion is identified in the right breast.

Skin thickness appears normal.

There are no findings suspicious for malignancy on the left.

Mammographic images were processed with CAD.

On physical exam, there is a firm palpable mass approximately 2 cm
from the nipple in the 7 o'clock axis of the right breast. On
physical exam, the mass is approximately 2 cm in size.

Targeted ultrasound is performed, showing a hypoechoic irregular
mass with internal calcification is identified at approximately 7
o'clock position 2-3 cm from the nipple. Mass measures 2.3 x 1.8 x
1.7 cm. A focus of internal vascular flow is identified.

Ultrasound survey of the right axilla shows normal lymph nodes. No
lymphadenopathy detected in the right axilla.
IMPRESSION: Suspicious 2.3 cm mass in the 7 o'clock position of the right
breast. The mass is palpable.

No evidence of malignancy in the left breast.

Mammographic evaluation limited by wheelchair status.

RECOMMENDATION:
Ultrasound-guided core needle biopsy of the right breast mass is
being scheduled for the patient. The procedure of biopsy was
discussed in detail with the patient and her husband today.

I have discussed the findings and recommendations with the patient.
If applicable, a reminder letter will be sent to the patient
regarding the next appointment.

BI-RADS CATEGORY  5: Highly suggestive of malignancy.

## 2020-08-04 NOTE — Telephone Encounter (Addendum)
Received call from [name], hospice nurse, who reports she is with the pt and she is having the same symptoms and reported to her that she was in the ER yesterday and that they did a CT and she did not tell the nurse she left without being seen. The nurse can see notes in Epic but was not sure if she was missing anything. Advised that pt was not seen and did not have CT. Advised if pt is still having the same stroke symptoms, left sided weakness and HA rated at a 9, pt needs to return to the ER to be assessed. [name] verbalized understanding and will advise the pt.   Number for [name] is [phone]

## 2020-08-04 NOTE — ED Triage Notes (Addendum)
Pt arrives via [facility] from home with c/o L sided arm weakness and some slurred speech x3 days. No facial droop. Home health nurse did report that patients speech is more slurred than baseline. Pt a/ox4. Pt seen here for the same yesterday, labs drawn and LWBS.

## 2020-08-04 NOTE — ED Notes (Signed)
Registration told staff that the pt left

## 2020-08-04 NOTE — Telephone Encounter (Signed)
NOTE: All timestamps contained within this report are represented as [place] Standard Time. CONFIDENTIALTY NOTICE: This fax transmission is intended only for the addressee. It contains information that is legally privileged, confidential or otherwise protected from use or disclosure. If you are not the intended recipient, you are strictly prohibited from reviewing, disclosing, copying using or disseminating any of this information or taking any action in reliance on or regarding this information. If you have received this fax in error, please notify us immediately by telephone so that we can arrange for its return to [place]. Phone: [phone], Toll-Free: [phone], Fax: [phone] Page: 1 of 2 Call Id: [id number] [facility] - Client TELEPHONE ADVICE RECORD [facility] Patient Name: [name] Gender: Female DOB: 01/17/1971 Age: 50 Y 75 M 12 D Return Phone Number: [phone] (Primary), [phone] (Secondary) Address: [place] [address] Client [facility] - Client Client Site [place] - Day Physician Alma Friendly - NP Contact Type Call Who Is Calling Patient / Member / Family / Caregiver Call Type Triage / Clinical Caller Name [name] Relationship To Patient Care [place] Return Phone Number [phone] (Primary) Chief Complaint SPEAK - sudden inability to talk or slurred speech Reason for Call Symptomatic / Request for [place] states that a care connection nurse has called in to inform [place] to inform them of a patient was advised to go to the ER and was not seen. She is experiencing stroke like symptoms of slurred speech, drooping, numbness. Translation No No Triage Reason Other Nurse Assessment Nurse: Velta Addison, RN, [place] Date/Time (Eastern Time): 08/04/2020 11:31:40 AM Confirm and document reason for call. If  symptomatic, describe symptoms. ---Caller states that a care connection nurse has called in to inform [place] to inform them of a patient was advised to go to the ER and was not seen. She is experiencing stroke like symptoms of slurred speech, drooping, numbness. Left side weakness, numbness, tingling, arm/leg drop, slurred speech; no facial drooping. Caregiver [name] states they saw her and that she had CT, per pt, but they are unable to pull anything in epic other than lab work to see what was done. States she now has a headache 9/10 at this time, has been showing signs since Sunday afternoon. I placed the caregiver on hold and called backline and I am speaking with a nurse now. I connected the two in a conference call and disconnected. Does the patient have any new or worsening symptoms? ---Yes Will a triage be completed? ---No [place] reason for no triage. ---Other Disp. Time Eilene Ghazi Time) Disposition Final User 08/04/2020 11:28:39 AM Send to Urgent Queue Wynema Birch 08/04/2020 11:39:25 AM Clinical Call Yes Parrott, RN, [place] PLEASE NOTE:

## 2020-08-04 NOTE — ED Notes (Signed)
No answer for MRI

## 2020-08-04 NOTE — Telephone Encounter (Signed)
Spoke with [name] in [place]. Patient's Lupron was denied. Patient is not eligible for patient manufacture assistance for lupron as she is covered with commercial insurance.

## 2020-08-04 NOTE — Telephone Encounter (Signed)
Anna with [facility] left a voicemail stating that she is in the home with the patient now. [name] stated that patient has had since Sunday night slurred speech off and on and more weakness on her left side. [name] stated that patient went to the ER yesterday and it appears that she had some lab work done but never saw a doctor. [name] stated that she is sending the patient back to the ER and wanted to let Allie Bossier NP now this.

## 2020-08-04 NOTE — Telephone Encounter (Signed)
Noted, patient currently at [facility].

## 2020-08-04 NOTE — Telephone Encounter (Signed)
Noted, patient currently at [facility]. 

## 2020-08-04 NOTE — Telephone Encounter (Signed)
Nurse from [facility] called in stating that the patient is having slurred talk, numbness, and tingling. Sent to triage. Just wanted to give a fyi

## 2020-08-04 NOTE — Telephone Encounter (Signed)
Per chart review tab pt is at [facility] ED; per note below access nurse note Gentry Fitz NP is aware pt is at [facility] ED.

## 2020-08-05 ENCOUNTER — Inpatient Hospital Stay: Payer: BC Managed Care – PPO | Attending: Oncology | Admitting: Oncology

## 2020-08-05 ENCOUNTER — Telehealth: Payer: Self-pay | Admitting: *Deleted

## 2020-08-05 ENCOUNTER — Other Ambulatory Visit: Payer: Self-pay

## 2020-08-05 ENCOUNTER — Ambulatory Visit: Payer: BC Managed Care – PPO

## 2020-08-05 VITALS — BP 120/58 | HR 82 | Temp 97.4°F | Resp 16

## 2020-08-05 DIAGNOSIS — C50511 Malignant neoplasm of lower-outer quadrant of right female breast: Secondary | ICD-10-CM | POA: Insufficient documentation

## 2020-08-05 DIAGNOSIS — Z86718 Personal history of other venous thrombosis and embolism: Secondary | ICD-10-CM | POA: Insufficient documentation

## 2020-08-05 DIAGNOSIS — I5032 Chronic diastolic (congestive) heart failure: Secondary | ICD-10-CM | POA: Diagnosis not present

## 2020-08-05 DIAGNOSIS — E785 Hyperlipidemia, unspecified: Secondary | ICD-10-CM | POA: Insufficient documentation

## 2020-08-05 DIAGNOSIS — R519 Headache, unspecified: Secondary | ICD-10-CM | POA: Diagnosis not present

## 2020-08-05 DIAGNOSIS — M47814 Spondylosis without myelopathy or radiculopathy, thoracic region: Secondary | ICD-10-CM | POA: Insufficient documentation

## 2020-08-05 DIAGNOSIS — N182 Chronic kidney disease, stage 2 (mild): Secondary | ICD-10-CM | POA: Diagnosis not present

## 2020-08-05 DIAGNOSIS — Z79810 Long term (current) use of selective estrogen receptor modulators (SERMs): Secondary | ICD-10-CM | POA: Insufficient documentation

## 2020-08-05 DIAGNOSIS — E781 Pure hyperglyceridemia: Secondary | ICD-10-CM | POA: Insufficient documentation

## 2020-08-05 DIAGNOSIS — R531 Weakness: Secondary | ICD-10-CM | POA: Diagnosis not present

## 2020-08-05 DIAGNOSIS — Z79899 Other long term (current) drug therapy: Secondary | ICD-10-CM | POA: Insufficient documentation

## 2020-08-05 DIAGNOSIS — I130 Hypertensive heart and chronic kidney disease with heart failure and stage 1 through stage 4 chronic kidney disease, or unspecified chronic kidney disease: Secondary | ICD-10-CM | POA: Insufficient documentation

## 2020-08-05 DIAGNOSIS — Z794 Long term (current) use of insulin: Secondary | ICD-10-CM | POA: Diagnosis not present

## 2020-08-05 DIAGNOSIS — F17210 Nicotine dependence, cigarettes, uncomplicated: Secondary | ICD-10-CM | POA: Insufficient documentation

## 2020-08-05 DIAGNOSIS — F319 Bipolar disorder, unspecified: Secondary | ICD-10-CM

## 2020-08-05 DIAGNOSIS — Z170 Estrogen receptor positive status [ER+]: Secondary | ICD-10-CM | POA: Insufficient documentation

## 2020-08-05 DIAGNOSIS — F419 Anxiety disorder, unspecified: Secondary | ICD-10-CM | POA: Insufficient documentation

## 2020-08-05 DIAGNOSIS — E1122 Type 2 diabetes mellitus with diabetic chronic kidney disease: Secondary | ICD-10-CM | POA: Insufficient documentation

## 2020-08-05 DIAGNOSIS — J449 Chronic obstructive pulmonary disease, unspecified: Secondary | ICD-10-CM | POA: Diagnosis not present

## 2020-08-05 DIAGNOSIS — Z7982 Long term (current) use of aspirin: Secondary | ICD-10-CM | POA: Diagnosis not present

## 2020-08-05 DIAGNOSIS — I2510 Atherosclerotic heart disease of native coronary artery without angina pectoris: Secondary | ICD-10-CM | POA: Diagnosis not present

## 2020-08-05 DIAGNOSIS — K219 Gastro-esophageal reflux disease without esophagitis: Secondary | ICD-10-CM | POA: Insufficient documentation

## 2020-08-05 IMAGING — MG MM BREAST LOCALIZATION CLIP
6 series · 6 of 18 positions shown · non-contrast
Comparison: Previous exam(s).

CLINICAL DATA: Evaluate biopsy marker

EXAM:
DIAGNOSTIC RIGHT MAMMOGRAM POST ULTRASOUND BIOPSY

[R CC synth-2D (1 of 2)]
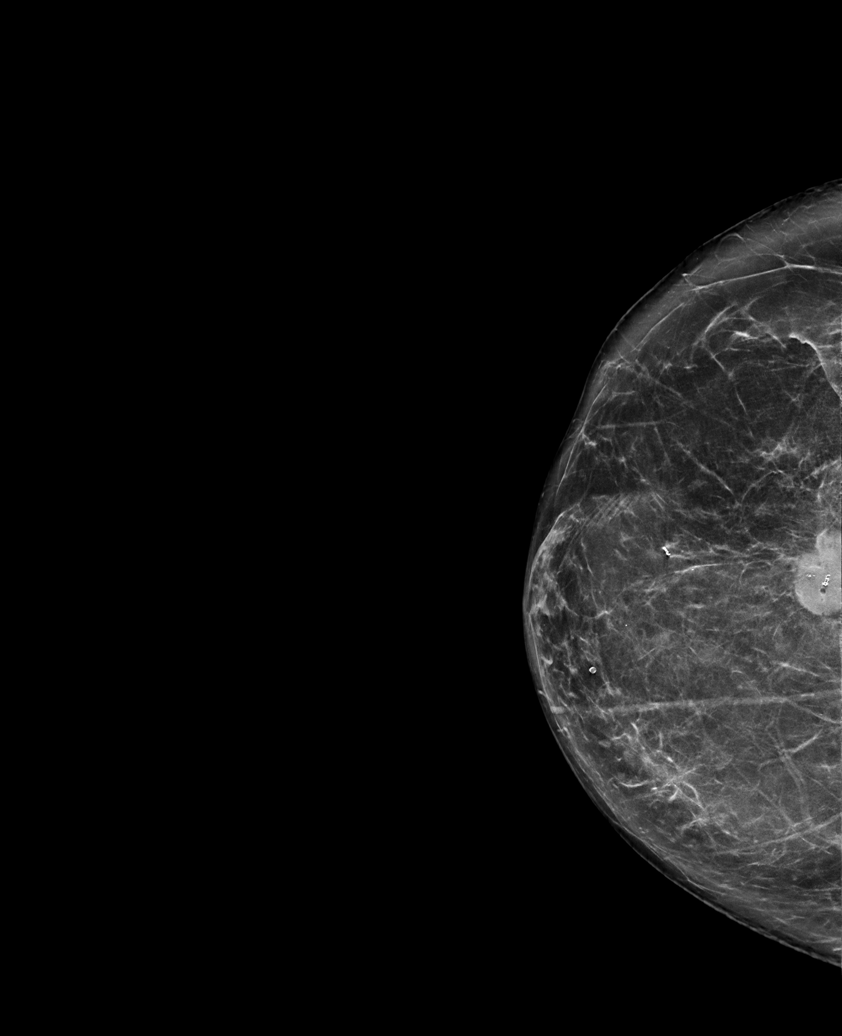

[R CC synth-2D (2 of 2)]
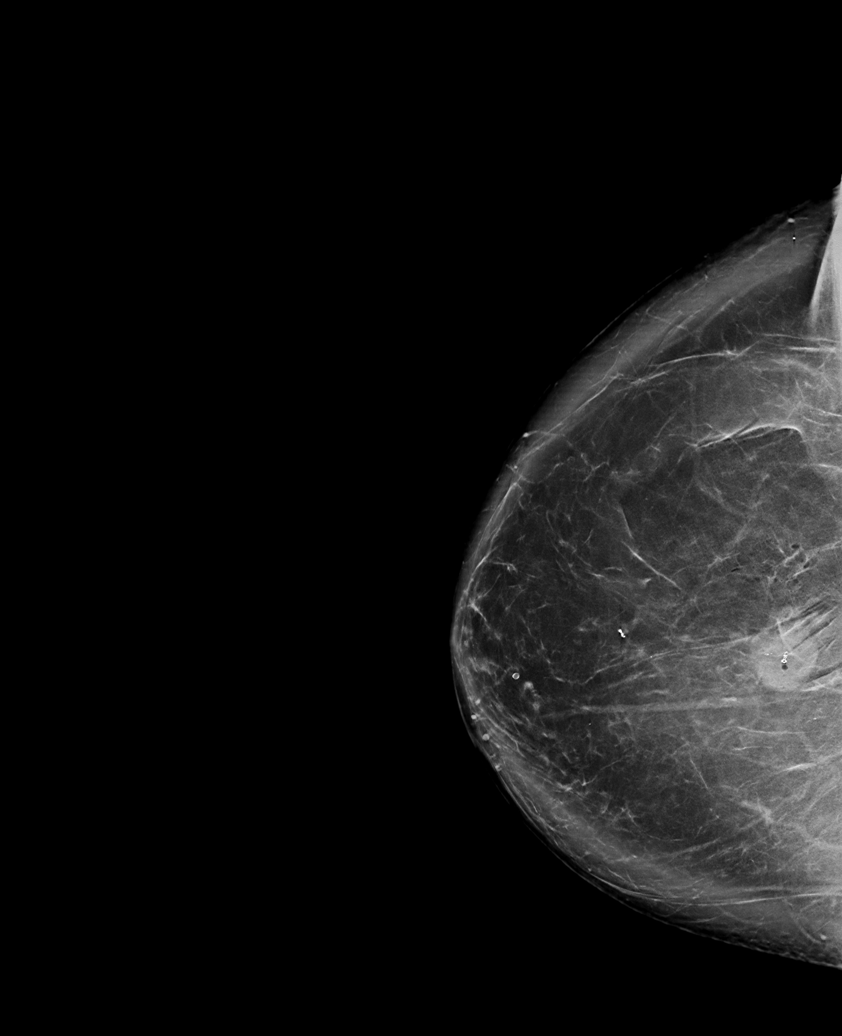

[R ML synth-2D]
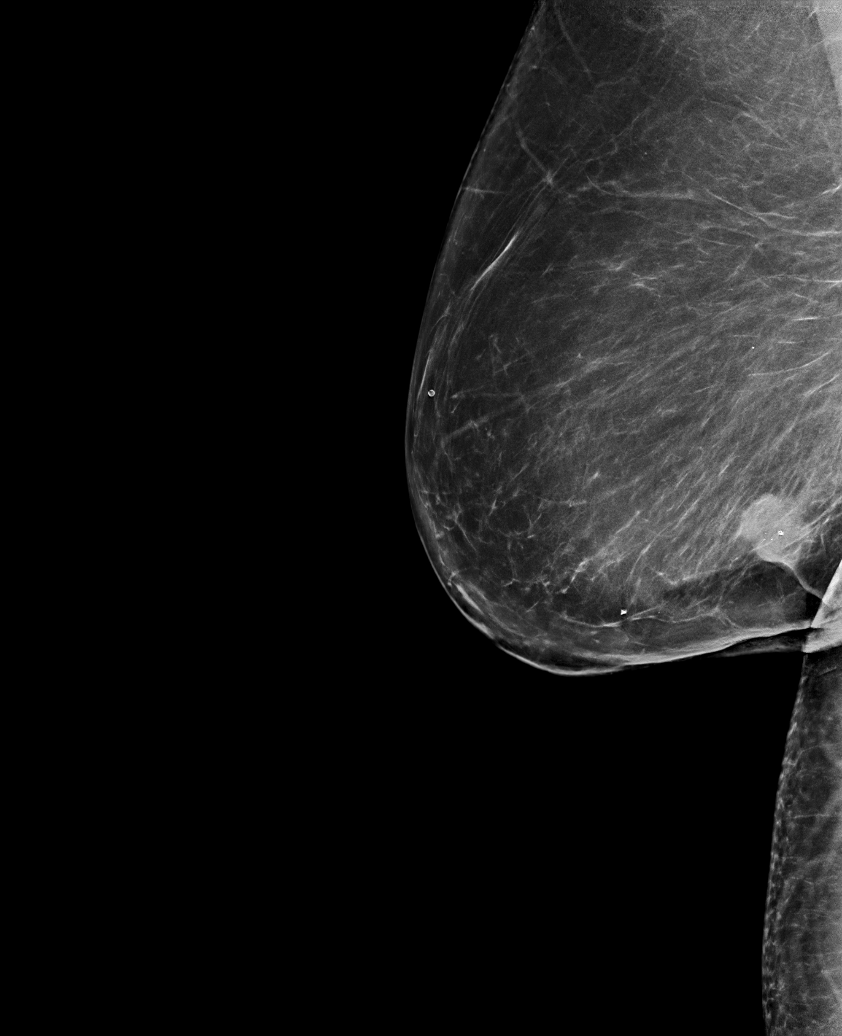

[R CC tomo (1 of 2) · tomo slice 43/85.0]
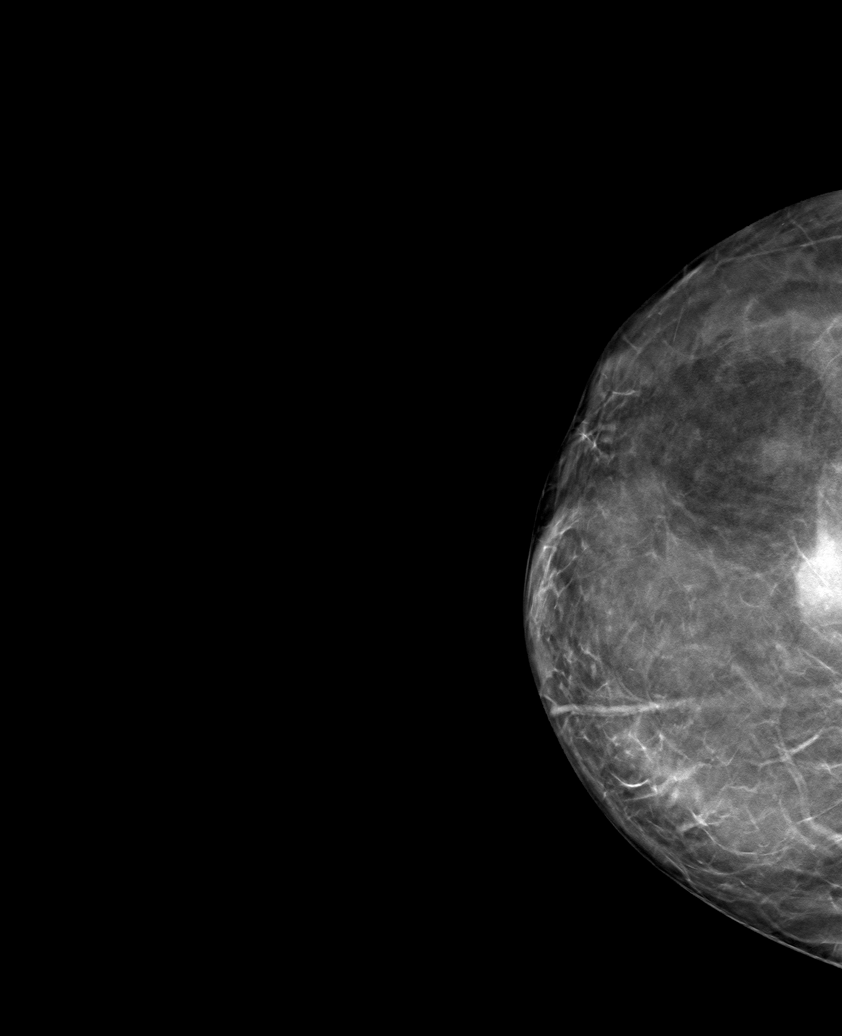

[R ML tomo · tomo slice 51/101.0]
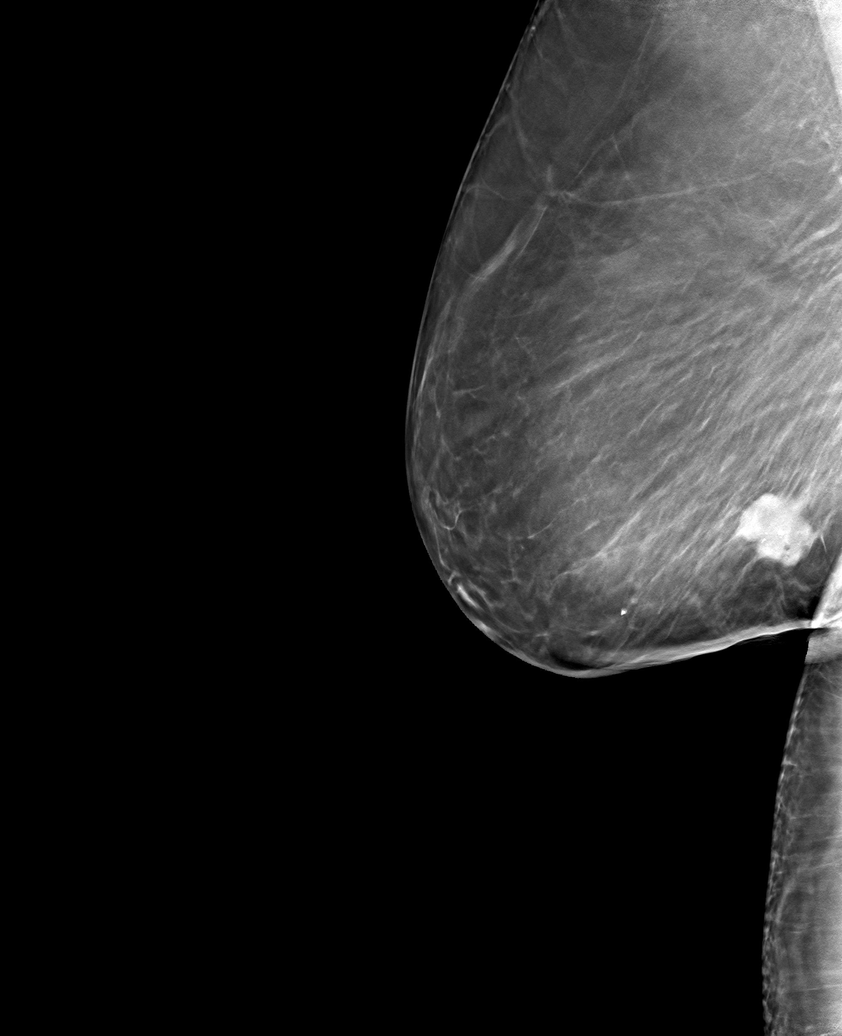

[R CC tomo (2 of 2) · tomo slice 57/113.0]
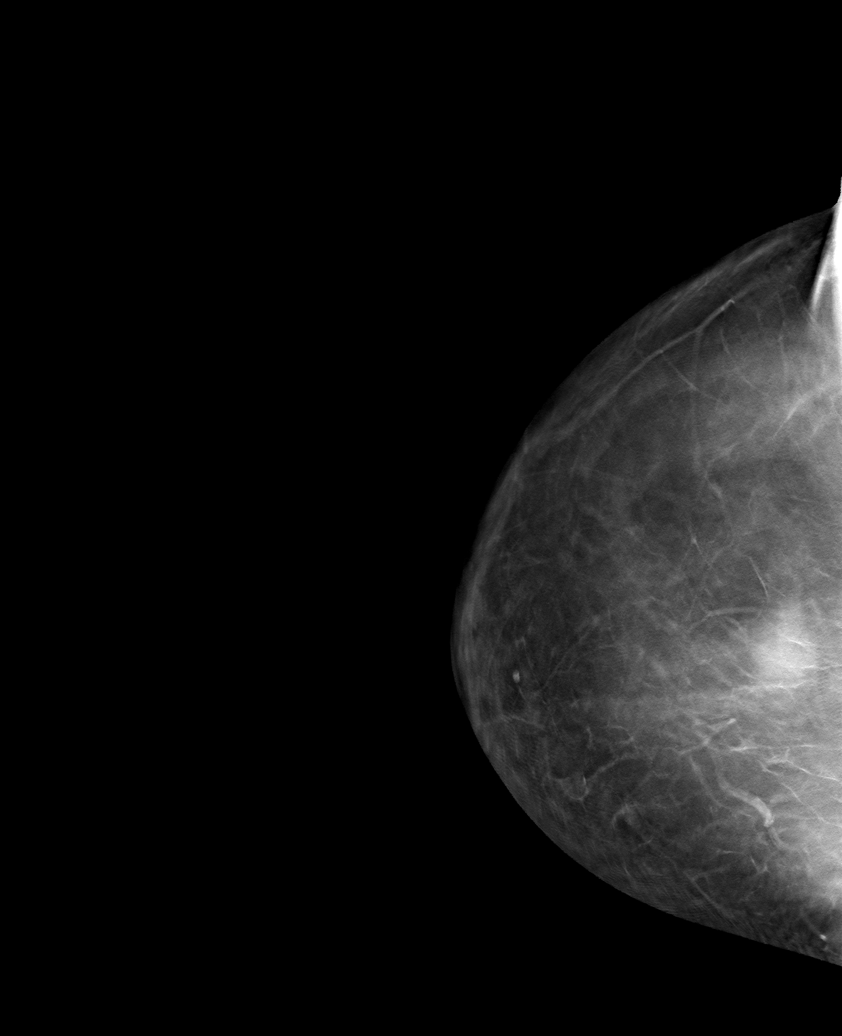

[6 of 18 positions shown; findings below may reference images not displayed]

FINDINGS: Mammographic images were obtained following ultrasound guided biopsy
of a 7 o'clock right breast mass. The biopsy marking clip is in
expected position at the site of biopsy.
IMPRESSION: Appropriate positioning of the venous shaped biopsy marking clip at
the site of biopsy in the biopsied mass.

Final Assessment: Post Procedure Mammograms for Marker Placement

## 2020-08-05 NOTE — Telephone Encounter (Signed)
Jenny/ [place]. Dr. B would like her to be evaluated in [facility].

## 2020-08-05 NOTE — Telephone Encounter (Signed)
Patient called requesting that a CT scan of her head be ordered to find out shy she is having migraines. Please advise

## 2020-08-05 NOTE — Progress Notes (Signed)
Symptom Management Consult note [facility]  [phone] [phone]  Patient Care Team: Pleas Koch, NP as PCP - General (Internal Medicine) Rosamaria Lints, MD (Inactive) as Referring Physician (Neurology) Alvester Chou, NP (Nurse Practitioner) Renato Shin, MD as Consulting Physician (Endocrinology) Theodore Demark, RN as Oncology Nurse Navigator Ronny Bacon, MD as Consulting Physician (General Surgery) [facility] as Registered Nurse Flora Lipps, MD as Consulting Physician (Pulmonary Disease)   Name of the patient: [name]  [id number]  06-16-1971   Date of visit: 08/05/2020   Diagnosis-right breast cancer  Chief complaint/ Reason for visit-headache  Heme/Onc history: Mrs. Logemann is a 50 year old female with past medical history significant for depression, anxiety, bipolar, diabetes, seizure disorder, CHF, COPD, stage III kidney disease and right breast cancer.  She is followed by Dr. [name].  She had a right mastectomy (April 2021) with poor wound healing secondary to obesity, diabetes and OSA.  She was not considered to be a great candidate for chemotherapy d/t poor performance status. She was started on tamoxifen.  She stopped tamoxifen in December 2021 secondary to nausea/diarrhea.  Recently switched to anastrozole plus Eligard for ovarian suppression.  She presented to emergency room on 08/03/2020 and 08/04/20 for left-sided weakness.  She left prior to being seen.  Interval history-she presents to clinic today for persistent left-sided weakness and headache x1 week with 2 syncopal episodes on Monday.  Episodes only lasted for a few seconds but husband thinks she did lose consciousness while sitting at the kitchen table.  Since Sunday, left-sided weakness and headaches have worsened.  She attempted to be seen in the emergency room but left secondary to long wait times.  She denies any vision changes,  nausea, vomiting, worsening shortness of breath, chest pain, abdominal pain, constipation or diarrhea.  Headaches are located at the back of her skull.  Throbbing in nature.  She uses a walker at home to ambulate but is having trouble due to left leg weakness.  Appetite is great.  No facial drooping.  She has tried Tylenol, ibuprofen and Excedrin migraine for headaches without relief.  States she was diagnosed with Covid on 07/22/2020.  ECOG FS:3 - Symptomatic, >50% confined to bed  Review of systems- Review of Systems  Constitutional: Positive for malaise/fatigue.  Respiratory: Positive for shortness of breath.   Neurological: Positive for dizziness, weakness and headaches.  Psychiatric/Behavioral: Positive for memory loss.     Current treatment-anastrozole plus Eligard  Allergies  Allergen Reactions  . Adhesive [Tape] Rash and Other (See Comments)    TAKES OFF THE SKIN (CERTAIN MEDICAL TAPES DO THIS!!)  . Metoprolol Shortness Of Breath    Occurrence of shortness of breath after 3 days  . Montelukast Shortness Of Breath  . Morphine Sulfate Anaphylaxis, Shortness Of Breath and Nausea And Vomiting    Swollen Throat - Able to tolerate dilaudid  . Penicillins Anaphylaxis, Hives and Shortness Of Breath    Throat swells Has patient had a PCN reaction causing immediate rash, facial/tongue/throat swelling, SOB or lightheadedness with hypotension: Yes Has patient had a PCN reaction causing severe rash involving mucus membranes or skin necrosis: No Has patient had a PCN reaction that required hospitalization: Yes Has patient had a PCN reaction occurring within the last 10 years: No If all of the above answers are "NO", then may proceed with Cephalosporin use.   . Prednisone Anaphylaxis  . Diltiazem Swelling  . Gabapentin Swelling  . Midodrine  Lightheaded and falling down     Past Medical History:  Diagnosis Date  . Anxiety    takes Prozac daily  . Anxiety   . Aortic valve  calcification   . Asthma    Advair and Spirva daily  . Asthma   . Bipolar disorder ([place])   . CAD (coronary artery disease)    a. LHC 11/2013 done for CP/fluid retention: mild disease in prox LAD, mild-mod disease in mRCA, EF 60% with normal LVEDP. b. Normal nuc 03/2016.  Marland Kitchen Cancer ([place])   . CHF (congestive heart failure) ([place])   . Chronic diastolic CHF (congestive heart failure) ([place])   . Chronic heart failure with preserved ejection fraction ([place]) 11/16/2018  . CKD (chronic kidney disease), stage II   . COPD (chronic obstructive pulmonary disease) (HCC)    a. nocturnal O2.  Marland Kitchen COPD (chronic obstructive pulmonary disease) ([place])   . Coronary artery disease   . Decreased urine stream   . Diabetes mellitus   . Diabetes mellitus without complication ([place])   . Dyspnea   . Family history of adverse reaction to anesthesia    mom gets nauseated  . GERD (gastroesophageal reflux disease)    takes Pepcid daily  . History of blood clots    left leg 3-37yrs ago  . Hyperlipidemia   . Hypertension   . Hypertriglyceridemia   . Inguinal hernia, left 01/2015  . Muscle spasm   . Open wound of genital labia   . Peripheral neuropathy   . RBBB   . Seizures ([place])   . Sepsis ([place]) 01/19/2018  . Sinus tachycardia    a. persistent since 2009.  Marland Kitchen Smokers' cough ([place])   . Stroke [facility]) 1989   left sided weakness  . TIA (transient ischemic attack)   . Tobacco abuse   . Vulvar abscess 01/23/2018     Past Surgical History:  Procedure Laterality Date  . APPLICATION OF WOUND VAC Right 01/29/2020   Procedure: APPLICATION OF WOUND VAC;  Surgeon: Ronny Bacon, MD;  Location: [facility];  Service: General;  Laterality: Right;  . BREAST BIOPSY Right 11/06/2019   [place] core path pending venus clip  . HERNIA REPAIR Left   . INCISION AND DRAINAGE ABSCESS N/A 01/26/2018   Procedure: INCISION AND DEBRIDEMENT OF VULVAR NECROTIZING SOFT TISSUE INFECTION;  Surgeon: Greer Pickerel, MD;  Location: [place];  Service: General;   Laterality: N/A;  . INCISION AND DRAINAGE PERIRECTAL ABSCESS N/A 01/22/2018   Procedure: IRRIGATION AND DEBRIDEMENT LABIAL/VULVAR AREA;  Surgeon: Coralie Keens, MD;  Location: [place];  Service: General;  Laterality: N/A;  . INCISION AND DRAINAGE PERIRECTAL ABSCESS N/A 01/29/2018   Procedure: IRRIGATION AND DEBRIDEMENT VULVA;  Surgeon: Excell Seltzer, MD;  Location: [place];  Service: General;  Laterality: N/A;  . INGUINAL HERNIA REPAIR Left 04/08/2015   Procedure: OPEN LEFT INGUINAL HERNIA REPAIR WITH MESH;  Surgeon: Ralene Ok, MD;  Location: [place];  Service: General;  Laterality: Left;  . INSERTION OF MESH Left 04/08/2015   Procedure: INSERTION OF MESH;  Surgeon: Ralene Ok, MD;  Location: [place];  Service: General;  Laterality: Left;  . LAPAROSCOPY     Endometriosis  . LEFT HEART CATHETERIZATION WITH CORONARY ANGIOGRAM N/A 12/05/2013   Procedure: LEFT HEART CATHETERIZATION WITH CORONARY ANGIOGRAM;  Surgeon: Jettie Booze, MD;  Location: [facility] CATH LAB;  Service: Cardiovascular;  Laterality: N/A;  . right kidney drained    . SIMPLE MASTECTOMY WITH AXILLARY SENTINEL NODE BIOPSY Right 12/23/2019  Procedure: Right SIMPLE MASTECTOMY WITH AXILLARY SENTINEL NODE BIOPSY;  Surgeon: Ronny Bacon, MD;  Location: [facility];  Service: General;  Laterality: Right;  . TEE WITHOUT CARDIOVERSION N/A 11/28/2013   Procedure: TRANSESOPHAGEAL ECHOCARDIOGRAM (TEE);  Surgeon: Thayer Headings, MD;  Location: [place];  Service: Cardiovascular;  Laterality: N/A;  . WOUND DEBRIDEMENT Right 01/29/2020   Procedure: DEBRIDEMENT WOUND, Excisional debridement skin, subcutaneous and muscle right chest wall;  Surgeon: Ronny Bacon, MD;  Location: [facility];  Service: General;  Laterality: Right;    Social History   Socioeconomic History  . Marital status: Married    Spouse name: Not on file  . Number of children: 2  . Years of education: Not on file  . Highest education level: Not on file   Occupational History  . Not on file  Tobacco Use  . Smoking status: Current Some Day Smoker    Packs/day: 0.25    Years: 36.00    Pack years: 9.00    Types: Cigarettes    Start date: 07/11/1981  . Smokeless tobacco: Never Used  . Tobacco comment: 1 ppd now  Vaping Use  . Vaping Use: Some days  Substance and Sexual Activity  . Alcohol use: No  . Drug use: No  . Sexual activity: Not Currently  Other Topics Concern  . Not on file  Social History Narrative   ** Merged History Encounter **       Lives in [place]   Married but lives with Boyfriend - not legally separated   Disabled - for BiPolar, Seizure disorder, diabetes   Formerly worked at [employer] 1 ppd; no alcohol. 2 step sons; no biologic children.       Social Determinants of Health   Financial Resource Strain: Not on file  Food Insecurity: Not on file  Transportation Needs: Not on file  Physical Activity: Not on file  Stress: Not on file  Social Connections: Not on file  Intimate Partner Violence: Not on file    Family History  Problem Relation Age of Onset  . Venous thrombosis Brother   . Other Brother        BRAIN TUMOR  . Asthma Father   . Diabetes Father   . Coronary artery disease Mother   . Hypertension Mother   . Diabetes Mother   . Breast cancer Mother 5  . Asthma Sister   . Diabetes type II Brother      Current Outpatient Medications:  .  acetaminophen (TYLENOL) 500 MG tablet, Take 1,000 mg by mouth every 6 (six) hours as needed for moderate pain. , Disp: , Rfl:  .  albuterol (VENTOLIN HFA) 108 (90 Base) MCG/ACT inhaler, Inhale 2 puffs into the lungs every 6 (six) hours as needed for wheezing or shortness of breath., Disp: , Rfl:  .  ALPRAZolam (XANAX) 1 MG tablet, Take 1 mg by mouth 4 (four) times daily as needed for anxiety. , Disp: , Rfl:  .  anastrozole (ARIMIDEX) 1 MG tablet, Take 1 tablet (1 mg total) by mouth daily. (Patient taking differently: Take 1 mg by mouth daily. Will  start on 12/27), Disp: 60 tablet, Rfl: 2 .  aspirin EC 81 MG tablet, Take 81 mg by mouth daily before breakfast. , Disp: , Rfl:  .  azithromycin (ZITHROMAX) 250 MG tablet, Take 2 tablets by mouth today, then 1 tablet daily for 4 additional days., Disp: 6 tablet, Rfl: 0 .  benzonatate (TESSALON) 200 MG  capsule, Take 1 capsule (200 mg total) by mouth 3 (three) times daily as needed for cough., Disp: 15 capsule, Rfl: 0 .  budesonide-formoterol (SYMBICORT) 80-4.5 MCG/ACT inhaler, INHALE 2 PUFFS BY MOUTH EVERY 12 HOURS TO PREVENT COUGH OR WHEEZING *RINSE MOUTH AFTER EACH USE* (Patient taking differently: Inhale 2 puffs into the lungs in the morning and at bedtime.), Disp: 10.2 g, Rfl: 3 .  busPIRone (BUSPAR) 30 MG tablet, Take 30 mg by mouth 2 (two) times daily., Disp: , Rfl:  .  butalbital-acetaminophen-caffeine (FIORICET) 50-325-40 MG tablet, Take 1-2 tablets by mouth every 6 (six) hours as needed for headache., Disp: 20 tablet, Rfl: 0 .  citalopram (CELEXA) 20 MG tablet, Take 1 tablet (20 mg total) by mouth daily., Disp: 30 tablet, Rfl: 2 .  Dapagliflozin-metFORMIN HCl ER 5-500 MG TB24, Take 1 tablet by mouth daily with breakfast., Disp: , Rfl:  .  desvenlafaxine (PRISTIQ) 100 MG 24 hr tablet, Take 100 mg by mouth daily., Disp: , Rfl:  .  doxycycline (VIBRA-TABS) 100 MG tablet, Take 1 tablet (100 mg total) by mouth 2 (two) times daily., Disp: 14 tablet, Rfl: 0 .  famotidine (PEPCID) 20 MG tablet, TAKE 1 TABLET (20 MG TOTAL) BY MOUTH 2 (TWO) TIMES DAILY. FOR HEARTBURN. (Patient taking differently: Take 20 mg by mouth 2 (two) times daily.), Disp: 180 tablet, Rfl: 1 .  Insulin Pen Needle 32G X 4 MM MISC, Used to give insulin injections twice daily., Disp: 100 each, Rfl: 11 .  insulin regular human CONCENTRATED (HUMULIN R) 500 UNIT/ML injection, Inject 30 Units into the skin 3 (three) times daily with meals. , Disp: , Rfl:  .  Insulin Syringe-Needle U-100 (GLOBAL INJECT EASE INSULIN SYR) 30G X 1/2" 1 ML  MISC, See admin instructions., Disp: , Rfl:  .  ipratropium-albuterol (DUONEB) 0.5-2.5 (3) MG/3ML SOLN, Take 3 mLs by nebulization every 4 (four) hours as needed (wheezing/shortness of breath)., Disp: 360 mL, Rfl: 0 .  metolazone (ZAROXOLYN) 5 MG tablet, Take 5 mg by mouth daily. , Disp: , Rfl:  .  nystatin-triamcinolone ointment (MYCOLOG), Apply 1 application topically 2 (two) times daily., Disp: 30 g, Rfl: 1 .  ondansetron (ZOFRAN) 4 MG tablet, Take 4 mg by mouth 2 (two) times daily., Disp: , Rfl:  .  OneTouch Delica Lancets [id number] MISC, 1 each by Does not apply route 3 (three) times daily. Use to monitor glucose levels three times daily; E11.9; E10.42, Disp: 100 each, Rfl: 11 .  ONETOUCH VERIO test strip, USE AS DIRECTED TO CHECK BLOOD SUGAR 3 TIMES DAILY, Disp: 100 each, Rfl: 0 .  OXYGEN, Inhale 2-4 L into the lungs 2 (two) times daily as needed (shortness of breath). , Disp: , Rfl:  .  pregabalin (LYRICA) 300 MG capsule, Take 1 capsule (300 mg total) by mouth 2 (two) times daily. For nerve pain., Disp: 180 capsule, Rfl: 0 .  promethazine-dextromethorphan (PROMETHAZINE-DM) 6.25-15 MG/5ML syrup, Take 5 mLs by mouth 2 (two) times daily as needed for cough. May cause drowsiness., Disp: 100 mL, Rfl: 0 .  rOPINIRole (REQUIP) 1 MG tablet, TAKE 1 TABLET BY MOUTH EVERY NIGHT AT BEDTIME FOR RESTLESS LEGS., Disp: 90 tablet, Rfl: 1 .  rosuvastatin (CRESTOR) 20 MG tablet, Take 1 tablet (20 mg total) by mouth daily., Disp: 90 tablet, Rfl: 0 .  spironolactone (ALDACTONE) 50 MG tablet, TAKE 1 TABLET BY MOUTH ONCE A DAY, Disp: 90 tablet, Rfl: 1 .  tamoxifen (NOLVADEX) 20 MG tablet, Take 1 tablet (20 mg  total) by mouth daily., Disp: 90 tablet, Rfl: 1 .  torsemide (DEMADEX) 20 MG tablet, TAKE 1 TABLET BY MOUTH 4 TIMES DAILY (Patient taking differently: Take 20 mg by mouth 2 (two) times daily.), Disp: 360 tablet, Rfl: 3 .  traZODone (DESYREL) 100 MG tablet, Take 200 mg by mouth at bedtime. , Disp: , Rfl:  .  ULTICARE  MINI PEN NEEDLES 31G X 6 MM MISC, USE AS DIRECTED 3 TIMES DAILY, Disp: 100 each, Rfl: 0  Physical exam:  Vitals:   08/05/20 1344  BP: (!) 120/58  Pulse: 82  Resp: 16  Temp: (!) 97.4 F (36.3 C)  TempSrc: Tympanic  SpO2: 97%   Physical Exam Constitutional:      General: Vital signs are normal.     Appearance: Normal appearance.  HENT:     Head: Normocephalic and atraumatic.  Eyes:     Pupils: Pupils are equal, round, and reactive to light.  Cardiovascular:     Rate and Rhythm: Normal rate and regular rhythm.     Heart sounds: Normal heart sounds. No murmur heard.   Pulmonary:     Effort: Pulmonary effort is normal.     Breath sounds: Normal breath sounds. No wheezing.  Abdominal:     General: Bowel sounds are normal. There is no distension.     Palpations: Abdomen is soft.     Tenderness: There is no abdominal tenderness.  Musculoskeletal:        General: No edema. Normal range of motion.     Left hand: Decreased strength.     Cervical back: Normal range of motion.     Comments: Left leg weakness  Skin:    General: Skin is warm and dry.     Findings: No rash.  Neurological:     Mental Status: She is alert and oriented to person, place, and time. Mental status is at baseline.     Motor: Weakness present.     Gait: Gait abnormal.  Psychiatric:        Mood and Affect: Mood normal.        Speech: Speech normal.        Judgment: Judgment normal.      CMP Latest Ref Rng & Units 08/03/2020  Glucose 70 - 99 mg/dL 343(H)  BUN 6 - 20 mg/dL 28(H)  Creatinine 0.44 - 1.00 mg/dL 1.27(H)  Sodium 135 - 145 mmol/L 135  Potassium 3.5 - 5.1 mmol/L 4.8  Chloride 98 - 111 mmol/L 98  CO2 22 - 32 mmol/L 27  Calcium 8.9 - 10.3 mg/dL 9.4  Total Protein 6.5 - 8.1 g/dL -  Total Bilirubin 0.3 - 1.2 mg/dL -  Alkaline Phos 38 - 126 U/L -  AST 15 - 41 U/L -  ALT 0 - 44 U/L -   CBC Latest Ref Rng & Units 08/03/2020  WBC 4.0 - 10.5 K/uL 8.8  Hemoglobin 12.0 - 15.0 g/dL 14.2   Hematocrit 36.0 - 46.0 % 46.7(H)  Platelets 150 - 400 K/uL 173    No images are attached to the encounter.  DG Chest 2 View  Result Date: 07/16/2020 CLINICAL DATA:  50 year old female with a history dry cough and shortness of breath EXAM: CHEST - 2 VIEW COMPARISON:  07/01/2020, 05/20/2020 FINDINGS: Cardiomediastinal silhouette unchanged in size and contour. Interlobular septal thickening, increased from the comparison of 07/01/2020, and relatively new from 05/20/2020. No pneumothorax or pleural effusion. No confluent airspace disease. No displaced fracture.  Degenerative changes of the  thoracic spine. IMPRESSION: Progression of interlobular septal thickening/reticular opacities. Differential includes atypical infection, edema, and, less likely, carcinomatosis. Electronically Signed   By: Corrie Mckusick D.O.   On: 07/16/2020 15:54   CT HEAD WO CONTRAST  Result Date: 08/06/2020 CLINICAL DATA:  Chronic headaches. Breast cancer. Left-sided weakness. EXAM: CT HEAD WITHOUT CONTRAST TECHNIQUE: Contiguous axial images were obtained from the base of the skull through the vertex without intravenous contrast. COMPARISON:  CT head 05/15/2020 FINDINGS: Brain: The brainstem, cerebellum, cerebral peduncles, thalami, basal ganglia, basilar cisterns, and ventricular system appear within normal limits. No intracranial hemorrhage, mass lesion, or acute CVA. Vascular: Unremarkable Skull: Unremarkable Sinuses/Orbits: Unremarkable Other: 0.4 cm BB or similar spherical metal foreign body lodged in the left frontoparietal scalp. IMPRESSION: 1. No significant intracranial abnormality. 2. 0.4 cm BB or similar spherical metal foreign body lodged in the left frontoparietal scalp. Electronically Signed   By: Van Clines M.D.   On: 08/06/2020 11:02   CT Chest W Contrast  Result Date: 07/22/2020 CLINICAL DATA:  50 year old female with cough. EXAM: CT CHEST WITH CONTRAST TECHNIQUE: Multidetector CT imaging of the chest was  performed during intravenous contrast administration. CONTRAST:  36mL OMNIPAQUE IOHEXOL 300 MG/ML  SOLN COMPARISON:  Chest CT dated 05/18/2020 and radiograph dated 07/16/2020. FINDINGS: Cardiovascular: There is no cardiomegaly or pericardial effusion. There is 3 vessel coronary vascular calcification. Mild atherosclerotic calcification of the thoracic aorta. No aneurysmal dilatation. The origins of the great vessels of the aortic arch appear patent as visualized. No pulmonary artery embolus identified. Mediastinum/Nodes: Top-normal right hilar lymph node measures 11 mm. The esophagus is grossly unremarkable. No mediastinal fluid collection. Lungs/Pleura: There is mild diffuse interstitial prominence and Kerley B-lines. There is diffuse ground-glass density throughout the lungs. Findings may represent edema. Atypical pneumonia is not excluded clinical correlation is recommended. Several scattered ground-glass nodules throughout the lungs noted with the largest measuring approximately 7 mm in the right upper lobe (62/3). An 8 mm nodule in the right lower lobe appears similar to prior CT. No focal consolidation, pleural effusion, pneumothorax. The central airways are patent. Upper Abdomen: Fatty liver. Musculoskeletal: Degenerative changes of the spine. No acute osseous pathology. IMPRESSION: 1. Mild diffuse interstitial prominence and ground-glass density throughout the lungs may represent edema. Atypical pneumonia is not excluded. Clinical correlation is recommended. 2. No CT evidence of pulmonary artery embolus. 3. Several scattered ground-glass nodules throughout the lungs noted with the largest measuring approximately 8 mm in the right lower lobe. Non-contrast chest CT at 3-6 months is recommended. If the nodules are stable at time of repeat CT, then future CT at 18-24 months (from today's scan) is considered optional for low-risk patients, but is recommended for high-risk patients. This recommendation follows the  consensus statement: Guidelines for Management of Incidental Pulmonary Nodules Detected on CT Images: From the Fleischner Society 2017; Radiology 2017; 284:228-243. 4. Aortic Atherosclerosis (ICD10-I70.0). Electronically Signed   By: Anner Crete M.D.   On: 07/22/2020 18:57     Assessment and plan- Patient is a 50 y.o. female who presents for acute onset headache, chronic but worsened left-sided weakness and 2 episodes of syncope.  Stage II right breast cancer: -Diagnosed in April 2021. - S/p right mastectomy complicated by poor wound healing secondary to comorbidities. -D/t poor performance status chemotherapy was deferred and she was started on tamoxifen.  Tamoxifen discontinued in December 2021 secondary to nausea/diarrhea. -She was started on anastrozole plus Eligard in January 2022. -She needs repeat mammogram of left breast in  April 2022.  Headache X 7 days: -Unclear etiology. -No history of migraines. -History of TIA, seizure disorder -Recommend CT wo contrast head -Unable to get MRI per patient secondary to metal BB in her forehead.  -CT of head does not show any evidence of intracranial abnormality. -Can trial Fioricet for migraines.  Prescription sent.  Left-sided weakness and syncope X 2: -Appears to be chronic in nature; residual from TIA.  -Patient and husband think it is worse.  -CT without contrast of her head-see above. -Recommend referral to neurology due to persistent left-sided weakness, new onset headache and seizure disorder.   -May need physical therapy.  -Patient requesting palliative care services.  Referral sent.  Recent COVID-19: -Results are negative from 07/22/2020.  Disposition: -CT head without contrast-no acute abnormality -Referral for palliative care sent. -Referral to psychiatry per patient request -Needs mammogram of left breast in April 2022. -Can trial Fioricet for headache.  Prescription sent. -RTC as scheduled in February for follow-up  with Dr. [name].  Visit Diagnosis 1. Acute left-sided weakness   2. End stage COPD ([place])   3. Chronic diastolic CHF (congestive heart failure) ([place])   4. Acute nonintractable headache, unspecified headache type   5. Bipolar affective disorder, remission status unspecified ([place])   6. Carcinoma of lower-outer quadrant of right breast in female, estrogen receptor positive ([place])     Patient expressed understanding and was in agreement with this plan. She also understands that She can call clinic at any time with any questions, concerns, or complaints.   Greater than 50% was spent in counseling and coordination of care with this patient including but not limited to discussion of the relevant topics above (See A&P) including, but not limited to diagnosis and management of acute and chronic medical conditions.   Thank you for allowing me to participate in the care of this very pleasant patient.    Jacquelin Hawking, NP [place] [facility] Cell - [phone] Pager- [phone] 08/10/2020 4:27 PM

## 2020-08-05 NOTE — Telephone Encounter (Signed)
Patient called and scheduled with [facility] at 1:30pm today. Pt verbalized understanding.

## 2020-08-06 ENCOUNTER — Ambulatory Visit (INDEPENDENT_AMBULATORY_CARE_PROVIDER_SITE_OTHER): Payer: BC Managed Care – PPO | Admitting: Nurse Practitioner

## 2020-08-06 ENCOUNTER — Ambulatory Visit
Admission: RE | Admit: 2020-08-06 | Discharge: 2020-08-06 | Disposition: A | Discharge status: Home / Self Care | Payer: BC Managed Care – PPO | Source: Ambulatory Visit | Attending: Oncology | Admitting: Oncology

## 2020-08-06 ENCOUNTER — Telehealth: Payer: Self-pay | Admitting: Adult Health Nurse Practitioner

## 2020-08-06 ENCOUNTER — Other Ambulatory Visit: Payer: Self-pay

## 2020-08-06 DIAGNOSIS — R29898 Other symptoms and signs involving the musculoskeletal system: Secondary | ICD-10-CM | POA: Diagnosis not present

## 2020-08-06 DIAGNOSIS — R531 Weakness: Secondary | ICD-10-CM | POA: Diagnosis not present

## 2020-08-06 DIAGNOSIS — R519 Headache, unspecified: Secondary | ICD-10-CM | POA: Diagnosis not present

## 2020-08-06 MED ORDER — BUTALBITAL-APAP-CAFFEINE 50-325-40 MG PO TABS: 1.0000 | ORAL_TABLET | Freq: Four times a day (QID) | ORAL | 0 refills | Status: DC | PRN

## 2020-08-06 NOTE — Telephone Encounter (Signed)
Spoke with patient regarding the Palliative referral/services and she was in agreement with scheduling a visit.  I have scheduled an In-person Consult for 08/10/20 @ 8:30 AM.

## 2020-08-06 NOTE — Progress Notes (Signed)
He Dr. [name],   I saw [name] yesterday for left-sided weakness and headache.  We did a CT of her head which essentially was negative.  She appears to have had a TIA in the past but is not followed by neurology.  I have sent a referral for her along with referral to palliative care per her request.  I am unsure about the headaches so we started her on Fioricet for migraines to see if this was helpful.  She will call us if symptoms worsen or fail to improve.  Faythe Casa, NP 08/06/2020 4:00 PM

## 2020-08-07 ENCOUNTER — Telehealth: Payer: Self-pay | Admitting: *Deleted

## 2020-08-07 DIAGNOSIS — J9610 Chronic respiratory failure, unspecified whether with hypoxia or hypercapnia: Secondary | ICD-10-CM | POA: Diagnosis not present

## 2020-08-07 NOTE — Telephone Encounter (Signed)
Incoming call from [name] from [facility] called to confirm that palliative care referral was received by Sonia Baller, NP for end stage copd.Call back # is [phone]

## 2020-08-10 ENCOUNTER — Other Ambulatory Visit: Payer: Self-pay

## 2020-08-10 ENCOUNTER — Other Ambulatory Visit: Payer: Self-pay | Admitting: Adult Health Nurse Practitioner

## 2020-08-11 ENCOUNTER — Ambulatory Visit: Payer: BC Managed Care – PPO | Admitting: Pulmonary Disease

## 2020-08-13 ENCOUNTER — Ambulatory Visit (INDEPENDENT_AMBULATORY_CARE_PROVIDER_SITE_OTHER): Payer: BC Managed Care – PPO | Admitting: Nurse Practitioner

## 2020-08-14 ENCOUNTER — Telehealth: Payer: Self-pay | Admitting: *Deleted

## 2020-08-14 ENCOUNTER — Other Ambulatory Visit: Payer: Self-pay | Admitting: Oncology

## 2020-08-14 DIAGNOSIS — F17200 Nicotine dependence, unspecified, uncomplicated: Secondary | ICD-10-CM

## 2020-08-14 DIAGNOSIS — R918 Other nonspecific abnormal finding of lung field: Secondary | ICD-10-CM

## 2020-08-14 DIAGNOSIS — Z122 Encounter for screening for malignant neoplasm of respiratory organs: Secondary | ICD-10-CM

## 2020-08-14 DIAGNOSIS — Z87891 Personal history of nicotine dependence: Secondary | ICD-10-CM

## 2020-08-14 NOTE — Telephone Encounter (Signed)
Disregard prior notes for lung screening, noted patient had recent ct chest and is due for f/u scan 3 months from that time.

## 2020-08-14 NOTE — Telephone Encounter (Signed)
Received referral for initial lung cancer screening scan. Contacted patient and obtained smoking history,(current, 72 pack year) as well as answering questions related to screening process. Patient denies signs of lung cancer such as weight loss or hemoptysis. Patient denies comorbidity that would prevent curative treatment if lung cancer were found. Patient is scheduled for shared decision making visit and CT scan on 08/25/20 at 130pm.

## 2020-08-18 ENCOUNTER — Other Ambulatory Visit: Payer: Self-pay | Admitting: Primary Care

## 2020-08-18 DIAGNOSIS — J449 Chronic obstructive pulmonary disease, unspecified: Secondary | ICD-10-CM | POA: Diagnosis not present

## 2020-08-18 DIAGNOSIS — E785 Hyperlipidemia, unspecified: Secondary | ICD-10-CM

## 2020-08-21 DIAGNOSIS — M545 Low back pain, unspecified: Secondary | ICD-10-CM | POA: Diagnosis not present

## 2020-08-21 DIAGNOSIS — M62838 Other muscle spasm: Secondary | ICD-10-CM | POA: Diagnosis not present

## 2020-08-24 ENCOUNTER — Telehealth: Payer: Self-pay | Admitting: Internal Medicine

## 2020-08-24 ENCOUNTER — Telehealth: Payer: Self-pay | Admitting: Student

## 2020-08-24 NOTE — Telephone Encounter (Signed)
Lm for Ally with [facility].

## 2020-08-24 NOTE — Telephone Encounter (Signed)
Spoke with patient and have rescheduled the In-home Palliative Consult for 09/07/20 @ 1 PM.

## 2020-08-25 DIAGNOSIS — F3342 Major depressive disorder, recurrent, in full remission: Secondary | ICD-10-CM | POA: Diagnosis not present

## 2020-08-25 DIAGNOSIS — F410 Panic disorder [episodic paroxysmal anxiety] without agoraphobia: Secondary | ICD-10-CM | POA: Diagnosis not present

## 2020-08-25 NOTE — Telephone Encounter (Signed)
lmx2 for Ally with [facility]. Will close encounter per office protocol.

## 2020-08-26 IMAGING — CR DG CHEST 2V
1 series · 2 of 2 positions shown · non-contrast
Comparison: 09/23/2019

CLINICAL DATA: Shortness of breath, mid chest pain for 2 weeks,
right breast cancer

EXAM:
CHEST - 2 VIEW

[Series 1: dg chest 2 view · 0.14mm/px · 2 of 2 slices shown]
[im 1/2]
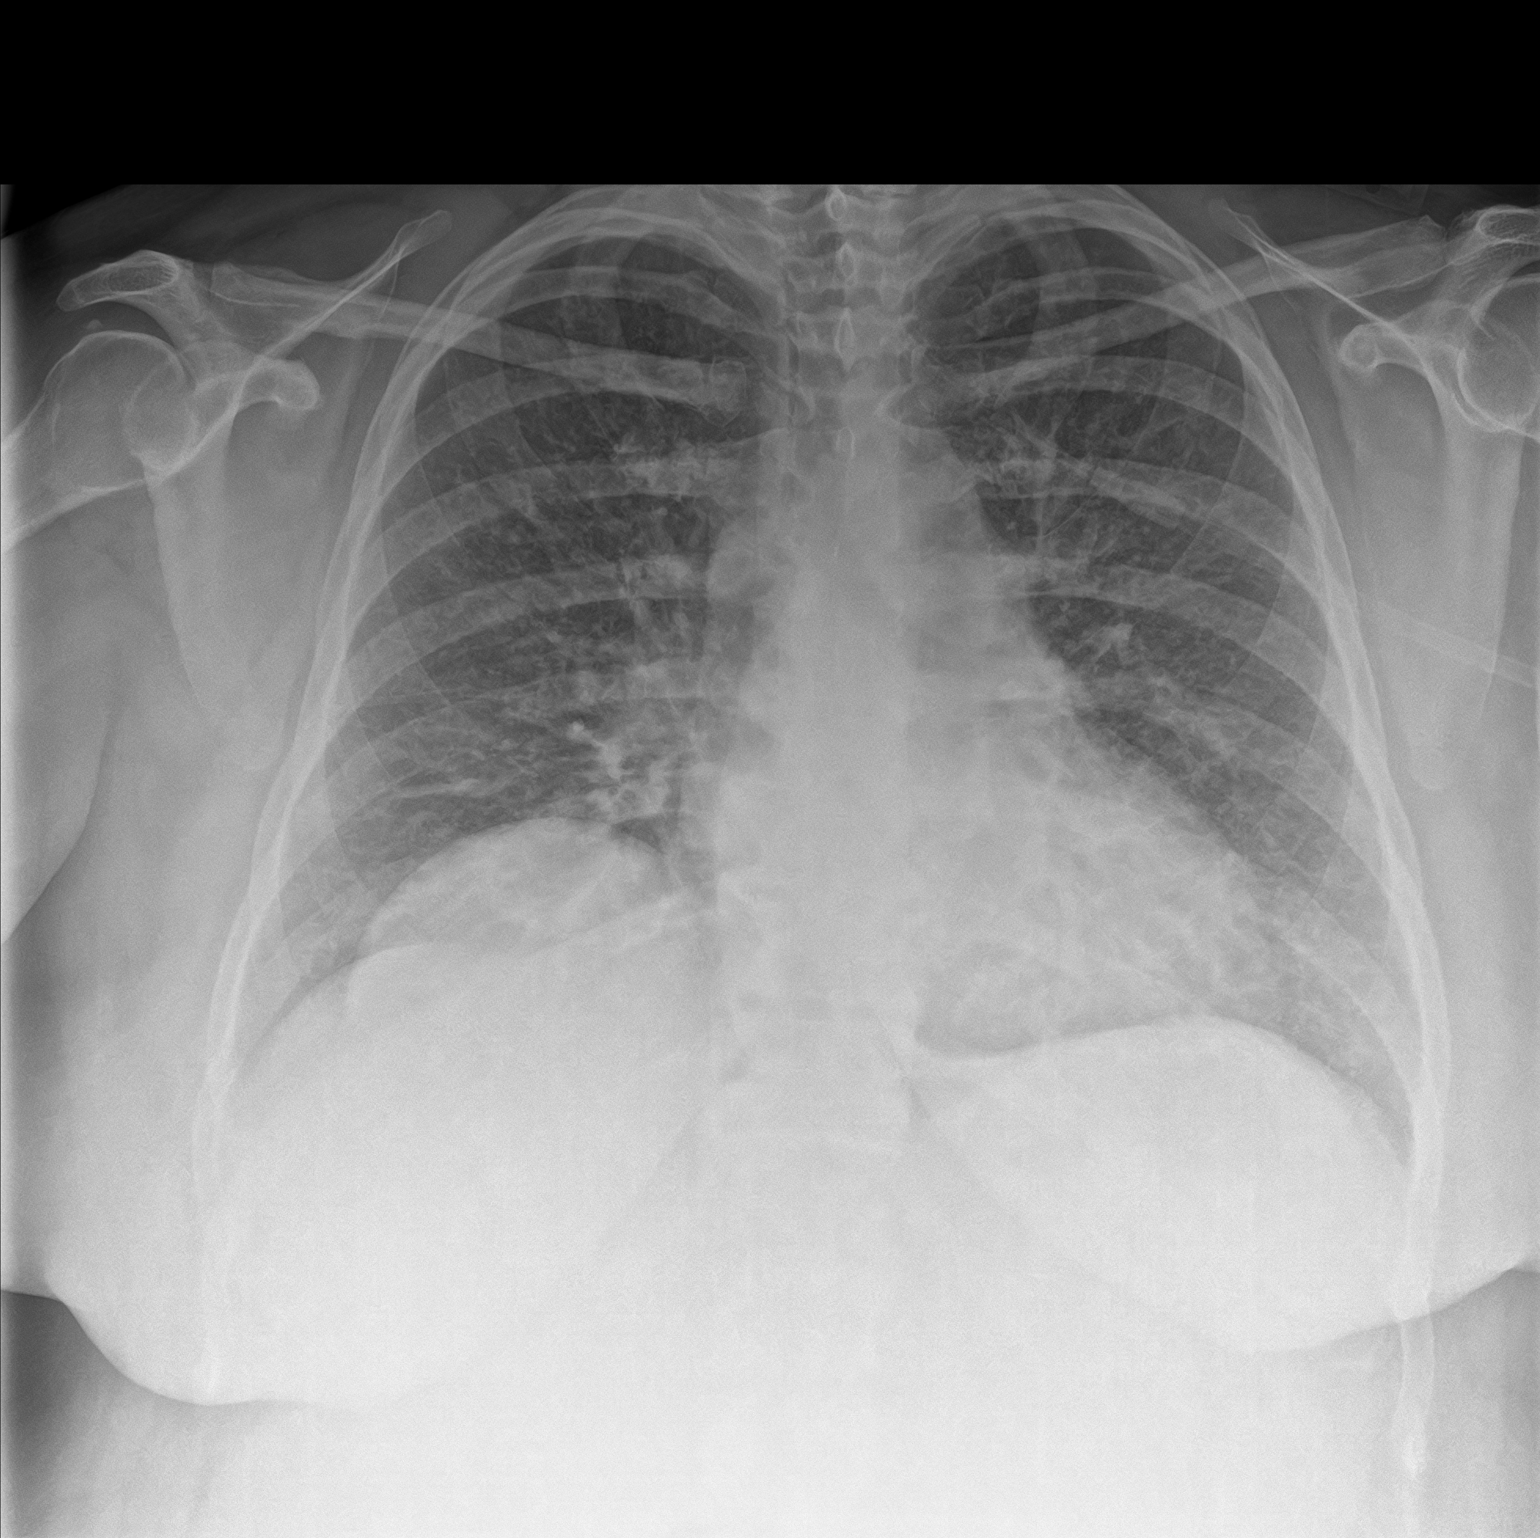
[im 2/2]
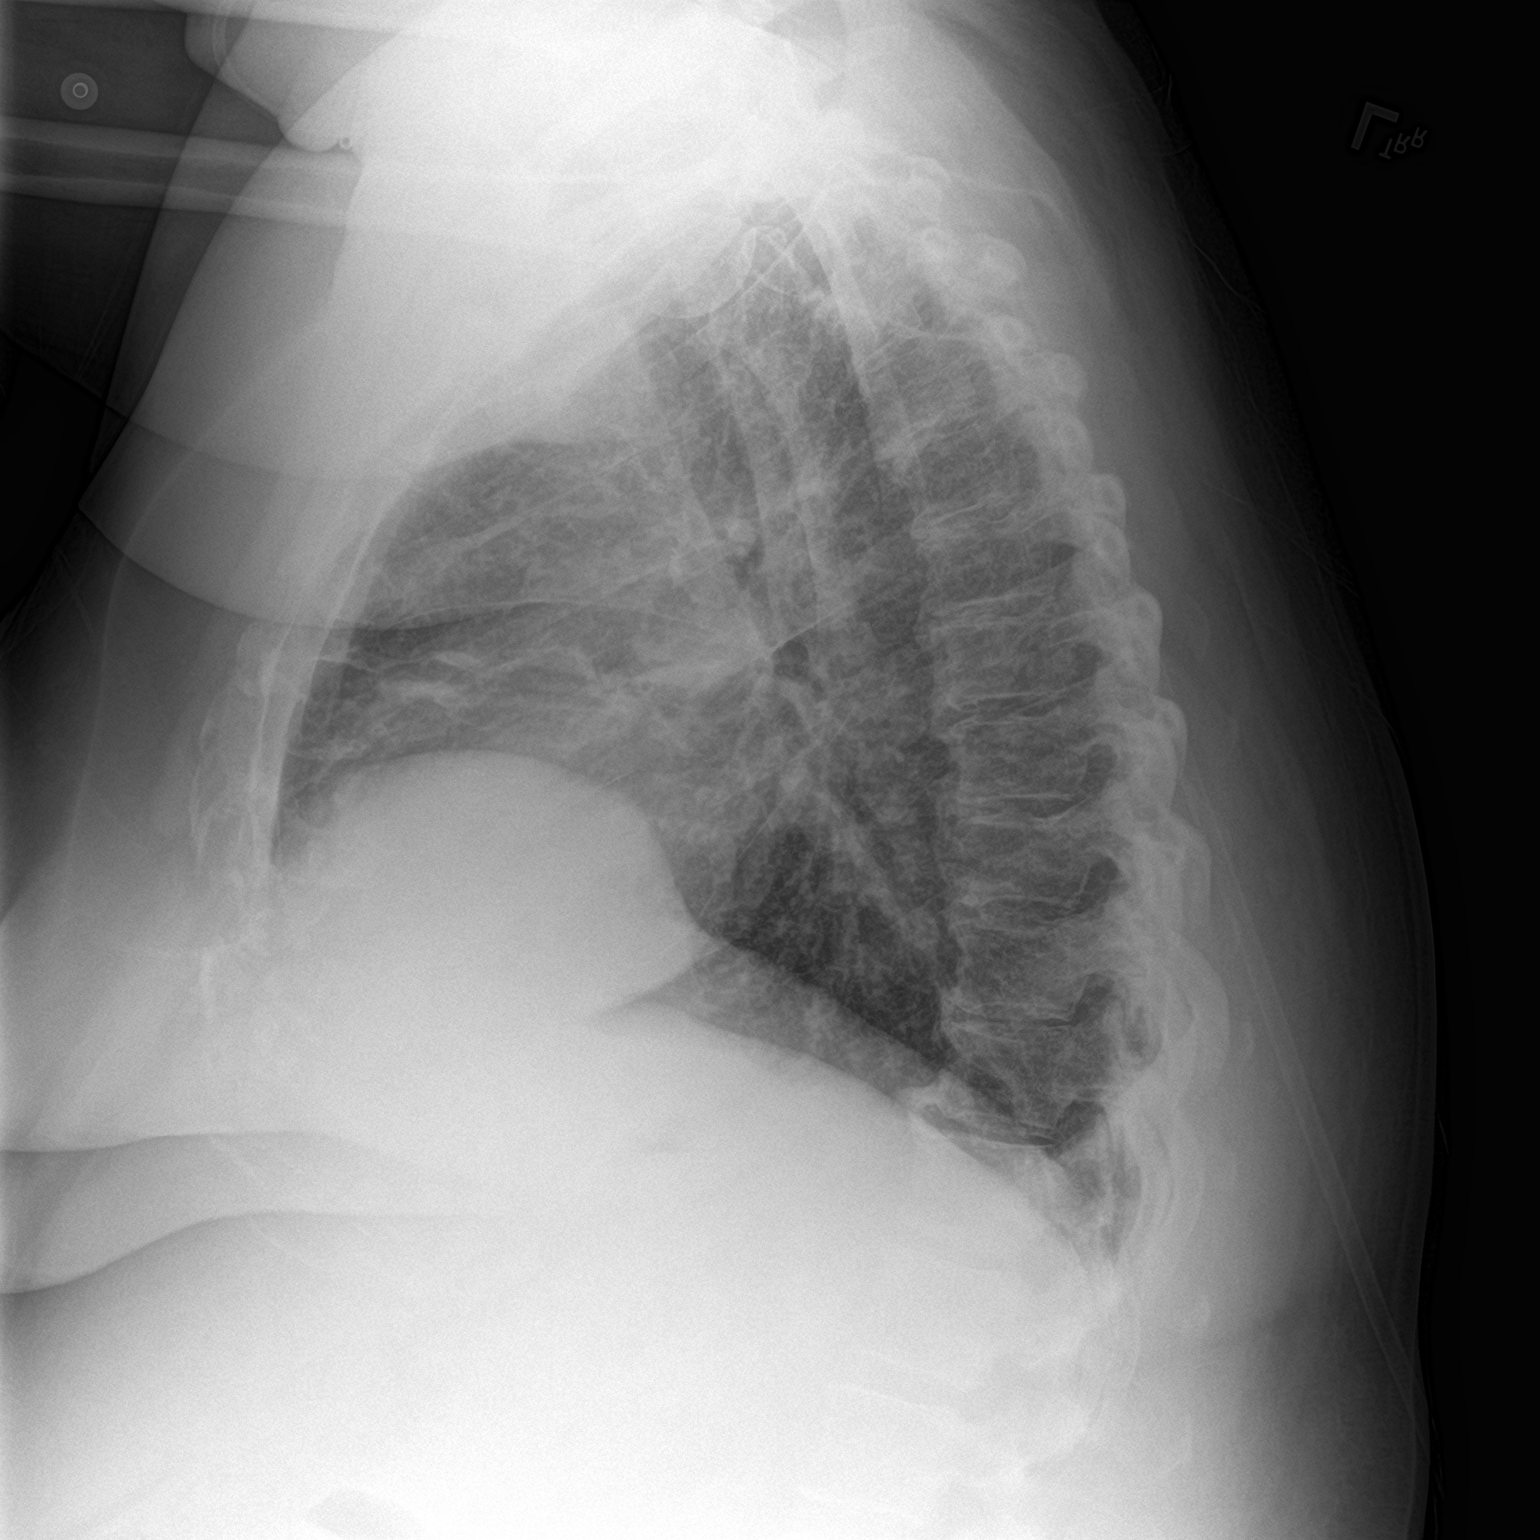

[2 of 2 positions shown; findings below may reference images not displayed]

FINDINGS: Frontal and lateral views of the chest demonstrate an unremarkable
cardiac silhouette. No airspace disease, effusion, or pneumothorax.
Eventration right hemidiaphragm again noted. No acute bony
abnormalities.
IMPRESSION: 1. No acute intrathoracic process.

## 2020-08-28 ENCOUNTER — Telehealth: Payer: Self-pay

## 2020-08-28 NOTE — Telephone Encounter (Signed)
Left message to return call to our office.  

## 2020-08-28 NOTE — Telephone Encounter (Signed)
Noted  

## 2020-08-28 NOTE — Telephone Encounter (Addendum)
Anna nurse with [facility] left v/m that she saw pt earlier today 08/28/20. In V/M [name] said Pt was seen at [facility] in [place] on 08/21/20 due to [facility] not having appts. Do not see phone note or any notation about pt contacting [place] on 08/21/20. Pt continues with shoulder and side pain; pt was given prednisone and flexeril for muscle spasms and [name] understood pt had blood clot but not sure where blood clot was located. per 08/24/20 note pt rescheduled palliative consult 09/07/20 at 1 pm. See 07/22/20 result note for CT chest with contrast and phone note 07/23/20. [name] thought might want to see pt in our office today if possible. I left v/m for [name] nurse with [facility] to see if pt was having any difficulty in breathing or where ? Blood clot was. Left v/m requesting [name] to cb to [facility]. Sending note to Gentry Fitz NP and [facility] CMA. Anda Kraft was in room and I spoke with [facility] CMA and she said Anda Kraft would see note before leaving.

## 2020-08-28 NOTE — Telephone Encounter (Signed)
Anna nurse with [facility] which is affiliated with [facility] called back;previously [name] had represented herself as nurse with [facility]; when I advised that pt is to have consult with palliative care on 09/07/20 [name] said that [facility] a [facility] of [facility] and they had been seeing pt for 2 years.no other discussion about palliative care was discussed. pt spoke with Michaela Corner earlier at 2:43 requesting refill for prednisone. Anna nurse with [facility] said that pt still has a catch in her side and was told could have blood clot but not sure if clot in lungs or tissue. No CP or SOB per [name]. [name] said pt could be a poor historian so [name] could not give me more info about xrays or scans. I spoke to [place] at [facility] in [place]; pt was seen at [facility] on 08/21/20 for muscle spasms but no record or order found for xray or scan on 08/21/20. Sending note to Gentry Fitz NP and [facility] CMA. (I also spoke with [facility] CMA about this note.)

## 2020-08-28 NOTE — Telephone Encounter (Signed)
Patient went to [facility] last Friday. She states that the Dr put her on Flexiril and Prednisone. She is out of the prednisone and needs a refill. Please advise. EM

## 2020-08-28 NOTE — Telephone Encounter (Signed)
CT chest from January 2022 was negative for blood clot within the lungs. I am happy to see her in the office for her symptoms, or she may schedule with Dr. Lorelei Pont for her shoulder.

## 2020-08-28 NOTE — Telephone Encounter (Signed)
Called patient appointment has been made for follow up with our office on Tuesday. Reviewed all red words that she would need to have treatment immediately. Repeated back to me and voiced understanding.

## 2020-08-31 ENCOUNTER — Telehealth: Payer: Self-pay | Admitting: Internal Medicine

## 2020-08-31 ENCOUNTER — Inpatient Hospital Stay: Payer: BC Managed Care – PPO | Admitting: Internal Medicine

## 2020-08-31 ENCOUNTER — Inpatient Hospital Stay: Payer: BC Managed Care – PPO

## 2020-08-31 NOTE — Telephone Encounter (Signed)
Dr. B - please advise. 

## 2020-08-31 NOTE — Telephone Encounter (Signed)
Notified provider of response below. Per Dr. Jacinto Reap pt can be schedule for a virtual visit later this week.   C-please schedule virtual visit for later this week.

## 2020-08-31 NOTE — Telephone Encounter (Signed)
08/31/2020 Pt called and would like to cxl appts for today. She said she fell and is unable to get around well right now. She will call back to r/s  SRW

## 2020-09-01 ENCOUNTER — Encounter: Payer: Self-pay | Admitting: Primary Care

## 2020-09-01 ENCOUNTER — Ambulatory Visit (INDEPENDENT_AMBULATORY_CARE_PROVIDER_SITE_OTHER): Payer: BC Managed Care – PPO | Admitting: Primary Care

## 2020-09-01 ENCOUNTER — Other Ambulatory Visit: Payer: Self-pay

## 2020-09-01 VITALS — BP 132/74 | HR 84 | Temp 97.2°F | Wt 254.8 lb

## 2020-09-01 DIAGNOSIS — M545 Low back pain, unspecified: Secondary | ICD-10-CM | POA: Insufficient documentation

## 2020-09-01 DIAGNOSIS — Z23 Encounter for immunization: Secondary | ICD-10-CM | POA: Diagnosis not present

## 2020-09-01 MED ORDER — DICLOFENAC SODIUM 1 % EX GEL: 2.0000 g | Freq: Three times a day (TID) | CUTANEOUS | 0 refills | Status: DC | PRN

## 2020-09-01 MED ORDER — CYCLOBENZAPRINE HCL 10 MG PO TABS: 10.0000 mg | ORAL_TABLET | Freq: Two times a day (BID) | ORAL | 0 refills | Status: DC | PRN
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
## 2020-09-01 NOTE — Patient Instructions (Addendum)
You may take the cyclobenzaprine (Flexeril) twice daily as needed for muscle spasms. This may cause drowsiness.  You may use the diclofenac (Voltaren) Gel three times daily as needed for pain.   Continue walking everyday. Try stretching when able.   Please schedule a follow up appointment in 3 months. We will give you the second Shingles vaccine at that time.   It was a pleasure to see you today!

## 2020-09-01 NOTE — Assessment & Plan Note (Signed)
Since second fall onto same left lateral and posterior side. No alarm signs on exam.  Suspect muscle spasms/involvement given HPI and exam.  Agree to refill Flexeril. Do not recommend oral NSAID's given CHF and CKD, will trial topical Voltaren Gel. Discussed walking and stretching. Follow up PRN>

## 2020-09-01 NOTE — Addendum Note (Signed)
Addended by: Carter Kitten on: 09/01/2020 12:41 PM   Modules accepted: Orders

## 2020-09-07 ENCOUNTER — Other Ambulatory Visit: Payer: Self-pay

## 2020-09-07 ENCOUNTER — Other Ambulatory Visit: Payer: Self-pay | Admitting: Student

## 2020-09-07 ENCOUNTER — Telehealth: Payer: Self-pay | Admitting: Student

## 2020-09-07 DIAGNOSIS — J9610 Chronic respiratory failure, unspecified whether with hypoxia or hypercapnia: Secondary | ICD-10-CM | POA: Diagnosis not present

## 2020-09-07 NOTE — Telephone Encounter (Signed)
Palliative NP called patient to confirm 1pm meeting for this afternoon. Patient has left home for the day and needs to reschedule appointment. Appointment rescheduled for 09/22/20 at 11am. She denies any immediate needs at this time.

## 2020-09-08 ENCOUNTER — Inpatient Hospital Stay: Payer: BC Managed Care – PPO | Admitting: Internal Medicine

## 2020-09-08 NOTE — Progress Notes (Unsigned)
I connected with [name] on 09/08/20 at  3:30 PM EST by video enabled telemedicine visit and verified that I am speaking with the correct person using two identifiers.  I discussed the limitations, risks, security and privacy concerns of performing an evaluation and management service by telemedicine and the availability of in-person appointments. I also discussed with the patient that there may be a patient responsible charge related to this service. The patient expressed understanding and agreed to proceed.    Other persons participating in the visit and their role in the encounter: RN/medical reconciliation Patient's location: home Provider's location: office  Oncology History Overview Note  #April 2021-2.5 cm right breast mass [incidental-CT scan chest]  # April 2021- RIGHT BREAST [facility]; s/p ER- 51-90%; PR- 51-90%; Her- 2-NEG. G-2. [Dr.Rodenberg]; s/p  Masectomy- pT2 pN0 (sn); Tamoxifen; Poor candidate for chemo; discontinue tamoxifen early DEC 2021 [sec to nausea/vomiting diarrhea]  #Late December 2021- Anastrazole + eligard  # DM- poorly controlled; Seizure disorder/Bipolar/TIA ; Diabetes PN- G-3 [cane/walker; falls]; COPD; OSA active smoker  # SURVIVORSHIP:   # GENETICS:   DIAGNOSIS: Right breast cancer  STAGE:    II     ;  GOALS: goal  CURRENT/MOST RECENT THERAPY : Tam     Carcinoma of lower-outer quadrant of right breast in female, estrogen receptor positive ([place])  11/12/2019 Initial Diagnosis   Carcinoma of lower-outer quadrant of right breast in female, estrogen receptor positive ([place])      Chief Complaint: ***    History of present illness:[name] 50 y.o.  female with history of   Observation/objective: Alert & oriented x 3. In No acute distress.   Assessment and plan: No problem-specific Assessment & Plan notes found for this encounter.    Follow-up instructions:  I discussed the assessment and treatment plan with the patient.  The patient was  provided an opportunity to ask questions and all were answered.  The patient agreed with the plan and demonstrated understanding of instructions.  The patient was advised to call back or seek an in person evaluation if the symptoms worsen or if the condition fails to improve as anticipated.  I provided *** minutes of {Blank single:19197::"face-to-face video visit time","non face-to-face telephone visit time"} during this encounter, and > 50% was spent counseling as documented under my assessment & plan.   Dr. Charlaine Foster [facility] at [facility] 09/08/2020 3:39 PM

## 2020-09-08 NOTE — Assessment & Plan Note (Signed)
#   T2N0- ER/PR-positive; poor candidate for chemotherapy; on tamoxifen; stopped early dec 2021-poor tolerance [sec to nausea/diarrhea]  #Given premenopausal status , recommend proceeding with aromatase inhibitor plus Eligard [ovarian suppression].  Again reviewed the potential side effects including but not limited to hot flashes; arthralgias etc.  #S/p mastectomy -wound healing issues; multiple risk factors including ongoing smoking/poorly controlled diabetes- defer to [place].   #Depression/anxiety/bipolar-currently on BuSpar/Celexa/trazodone.-As per psychiatry.  #Chronic respiratory failure-CHF/COPD-stable  #Poorly controlled diabetes-on insulin; followed endocrinology.  #Peripheral neuropathy grade 2-3; patient goes on with a walker/cane at baseline.  Stable  #Stage III kidney disease-second diabetes-stable  DISPOSITION: # eligard in 1 week # Follow up in 2 months; MD; labs- cbc/cmp- -Dr. Jacinto Reap Dr.Rdenberg

## 2020-09-09 ENCOUNTER — Inpatient Hospital Stay: Payer: BC Managed Care – PPO | Attending: Internal Medicine | Admitting: Internal Medicine

## 2020-09-09 ENCOUNTER — Other Ambulatory Visit: Payer: Self-pay

## 2020-09-09 ENCOUNTER — Encounter: Payer: Self-pay | Admitting: Internal Medicine

## 2020-09-09 DIAGNOSIS — C50511 Malignant neoplasm of lower-outer quadrant of right female breast: Secondary | ICD-10-CM

## 2020-09-09 DIAGNOSIS — Z170 Estrogen receptor positive status [ER+]: Secondary | ICD-10-CM | POA: Diagnosis not present

## 2020-09-09 NOTE — Assessment & Plan Note (Signed)
#   T2N0- ER/PR-positive; poor candidate for chemotherapy; on tamoxifen; stopped early dec 2021-poor tolerance [sec to nausea/diarrhea]  #Given premenopausal status , recommend proceeding with aromatase inhibitor plus Eligard [ovarian suppression]. However, eligard/lupron/ zoladex not covered by insurance. Discussed re: Oophorectomy BIL. Pt is in agreement.   #S/p mastectomy -wound healing issues; multiple risk factors including ongoing smoking/poorly controlled diabetes- STABLE.    #Depression/anxiety/bipolar-currently on BuSpar/Celexa/trazodone-STABLE-As per psychiatry.  #Chronic respiratory failure-CHF/COPD-STABLE.   #Poorly controlled diabetes-on insulin; followed endocrinology.  #Peripheral neuropathy grade 2-3; patient goes on with a walker/cane at baseline-STABLE.   #Stage III kidney disease-second diabetes-STABLE.   DISPOSITION: # referral to Gynecology ASAP re: Bil oopherectomy/ Hx of breast cancer # Follow up in 1 month; MD; labs- cbc/cmp- -Dr. Jacinto Reap

## 2020-09-09 NOTE — Progress Notes (Signed)
I connected with [name] on 09/09/20 at  9:15 AM EST by video enabled telemedicine visit and verified that I am speaking with the correct person using two identifiers.  I discussed the limitations, risks, security and privacy concerns of performing an evaluation and management service by telemedicine and the availability of in-person appointments. I also discussed with the patient that there may be a patient responsible charge related to this service. The patient expressed understanding and agreed to proceed.    Other persons participating in the visit and their role in the encounter: RN/medical reconciliation Patient's location: home Provider's location: office  Oncology History Overview Note  #April 2021-2.5 cm right breast mass [incidental-CT scan chest]  # April 2021- RIGHT BREAST [facility]; s/p ER- 51-90%; PR- 51-90%; Her- 2-NEG. G-2. [Dr.Rodenberg]; s/p  Masectomy- pT2 pN0 (sn); Tamoxifen; Poor candidate for chemo;   # Discontinue tamoxifen early DEC 2021 [sec to nausea/vomiting diarrhea]  #Late December 2021- Anastrazole + eligard  # DM- poorly controlled; Seizure disorder/Bipolar/TIA ; Diabetes PN- G-3 [cane/walker; falls]; COPD; OSA active smoker  # SURVIVORSHIP:   # GENETICS:   DIAGNOSIS: Right breast cancer  STAGE:    II     ;  GOALS: goal  CURRENT/MOST RECENT THERAPY : Tam     Carcinoma of lower-outer quadrant of right breast in female, estrogen receptor positive ([place])  11/12/2019 Initial Diagnosis   Carcinoma of lower-outer quadrant of right breast in female, estrogen receptor positive ([place])     Chief Complaint: breast cancer   History of present illness:[name] 50 y.o.  female perimenopausal history of ER/PR positive HER-2 negative stage II breast cancer is here for follow-up.  Patient is currently on anastrozole; because of poor tolerance to tamoxifen.  Patient denies any nausea vomiting.  Denies any diarrhea.  She continues to have chronic back pain;  chronic shortness of breath.  Tingling and numbness extremities from her chronic neuropathy.  Not any worse.  She currently goes on with a walker.   Observation/objective: Alert & oriented x 3. In No acute distress.   Assessment and plan: Carcinoma of lower-outer quadrant of right breast in female, estrogen receptor positive ([place]) # T2N0- ER/PR-positive; poor candidate for chemotherapy; on tamoxifen; stopped early dec 2021-poor tolerance [sec to nausea/diarrhea]  #Given premenopausal status , recommend proceeding with aromatase inhibitor plus Eligard [ovarian suppression]. However, eligard/lupron/ zoladex not covered by insurance. Discussed re: Oophorectomy BIL. Pt is in agreement.   #S/p mastectomy -wound healing issues; multiple risk factors including ongoing smoking/poorly controlled diabetes- STABLE.    #Depression/anxiety/bipolar-currently on BuSpar/Celexa/trazodone-STABLE-As per psychiatry.  #Chronic respiratory failure-CHF/COPD-STABLE.   #Poorly controlled diabetes-on insulin; followed endocrinology.  #Peripheral neuropathy grade 2-3; patient goes on with a walker/cane at baseline-STABLE.   #Stage III kidney disease-second diabetes-STABLE.   DISPOSITION: # referral to Gynecology ASAP re: Bil oopherectomy/ Hx of breast cancer # Follow up in 1 month; MD; labs- cbc/cmp- -Dr. B    Follow-up instructions:  I discussed the assessment and treatment plan with the patient.  The patient was provided an opportunity to ask questions and all were answered.  The patient agreed with the plan and demonstrated understanding of instructions.  The patient was advised to call back or seek an in person evaluation if the symptoms worsen or if the condition fails to improve as anticipated.  Dr. Charlaine Dalton Exline at [facility] 09/09/2020 9:32 AM

## 2020-09-10 ENCOUNTER — Encounter: Payer: Self-pay | Admitting: Podiatry

## 2020-09-10 ENCOUNTER — Ambulatory Visit (INDEPENDENT_AMBULATORY_CARE_PROVIDER_SITE_OTHER): Payer: BC Managed Care – PPO | Admitting: Podiatry

## 2020-09-10 ENCOUNTER — Other Ambulatory Visit: Payer: Self-pay

## 2020-09-10 DIAGNOSIS — N189 Chronic kidney disease, unspecified: Secondary | ICD-10-CM | POA: Diagnosis not present

## 2020-09-10 DIAGNOSIS — M79674 Pain in right toe(s): Secondary | ICD-10-CM

## 2020-09-10 DIAGNOSIS — M79675 Pain in left toe(s): Secondary | ICD-10-CM | POA: Diagnosis not present

## 2020-09-10 DIAGNOSIS — B351 Tinea unguium: Secondary | ICD-10-CM | POA: Diagnosis not present

## 2020-09-10 DIAGNOSIS — E1159 Type 2 diabetes mellitus with other circulatory complications: Secondary | ICD-10-CM

## 2020-09-10 DIAGNOSIS — M2010 Hallux valgus (acquired), unspecified foot: Secondary | ICD-10-CM

## 2020-09-10 DIAGNOSIS — N179 Acute kidney failure, unspecified: Secondary | ICD-10-CM | POA: Diagnosis not present

## 2020-09-10 NOTE — Progress Notes (Signed)
This patient returns to my office for at risk foot care.  This patient requires this care by a professional since this patient will be at risk due to having  PAD CKD and diabetes.    This patient is unable to cut nails herself since the patient cannot reach her nails.These nails are painful walking and wearing shoes. She presents to the office with her son to help balance her during gait. This patient presents for at risk foot care today. Patient is also interested in diabetic shoes.  General Appearance  Alert, conversant and in no acute stress.  Vascular  Dorsalis pedis and posterior tibial  pulses are weakly  palpable  bilaterally.  Capillary return is within normal limits  bilaterally. Temperature is within normal limits  bilaterally.  Neurologic  Senn-Weinstein monofilament wire test absent   bilaterally. Muscle power within normal limits bilaterally.  Nails Thick disfigured discolored nails with subungual debris  from hallux to fifth toes bilaterally. No evidence of bacterial infection or drainage bilaterally.  Orthopedic  No limitations of motion  feet .  No crepitus or effusions noted.  No bony pathology or digital deformities noted.  HAV  B/L.  Skin  normotropic skin with no porokeratosis noted bilaterally.  No signs of infections or ulcers noted.     Onychomycosis  Pain in right toes  Pain in left toes  Consent was obtained for treatment procedures.   Mechanical debridement of nails 1-5  bilaterally performed with a nail nipper.  Filed with dremel without incident.  Patient qualifies for diabetic shoes due to DPN and HAV  B/L.  Patient to return for diabetic shoes.   Return office visit    3 months                  Told patient to return for periodic foot care and evaluation due to potential at risk complications.   Gardiner Barefoot DPM

## 2020-09-11 ENCOUNTER — Telehealth: Payer: Self-pay

## 2020-09-11 ENCOUNTER — Telehealth: Payer: Self-pay | Admitting: *Deleted

## 2020-09-11 NOTE — Telephone Encounter (Signed)
Anna nurse with [facility] a division of palliative care with [facility]. [name] said that pt got [facility] med list from podiatry appt on 09/10/20 and [name] reviewed med list with meds pt has at home and there are 8 meds with discrepancies. The list of meds in question are 1) pt is presently taking ASA 325 mg taking 2 tabs daily; not sure when ASA changed from ASA EC 81 mg taking one tab daily which is what is on pts med list. 2) Pepcid 20 mg pt is taking one daily but on med list last noted # 180 x 1 refill on 06/05/2019 and med list instructions were take one tab po bid. Not sure when these instructions were changed or by whom. 3)zofran 4 mg pt takes prn; no date on med list when filled or name of provider who prescribed the med. 4) Celexa 20 mg is on med list for pt to take 1 tab po daily # 30 x 2 refills dated 12/17/19 by Dr [name]. [name] will ck with Dr [name] on this med. 5) Zithromax # 6 on 07/17/20 by Gentry Fitz NP [name] was advised that abx would be on med list until list updated) 6) Doxycycline # 14 07/01/20 by Gentry Fitz NP again [name] was advised that abx would be on med list until list updated. 7) Metolazone 5 mg ? Provider.  [name] also wants to know if pt should see Gentry Fitz NP next wk about cut like place on rt mid finger that is fleshy in center and draining clear liquid and blood; pt is not sure what happened or when. Also on lt palm there is a spot that is puffy but not open or fleshy. And left inner calf red,warm to touch; pt did not complain of pain and no more swelling than usual; legs are usually puffy. No CP or SOB. covid screening neg except pt has h/a all the time due to brain lesions and pts side and back muscles have been hurting since fall 08/29/20.  I spoke with Gentry Fitz NP and she said [name] could ck with pharmacy to get more info about what provider ordered the meds and when last time pt got the meds from pharmacy as a start to answer above questions; also [name] can make pt  appt with wound care about the finger and hand concerns. And Gentry Fitz NP concerned for pt to wait until next wk to be seen due to concern for blood clot in leg; Pt should go to UC or ED for eval and testing. [name] voiced understanding and will notify pt and see if pt will comply. Sending note to Gentry Fitz NP.

## 2020-09-11 NOTE — Telephone Encounter (Signed)
1) according to my note in December 2021 she was taking aspirin 81 mg.  She saw vascular services the following day who recommended she "continue antiplatelet therapy as prescribed".  I recommend she continue 81 mg as recommended.  2) if she is not experiencing any heartburn, then okay to stop Pepcid 20 mg. After a brief chart review, it looks like I have not filled this medication since November 2020, was provided with a 29-month supply after that, she would have been out by mid 2021.  She must be getting this over-the-counter?  3) recommend discontinuing ondansetron we are unsure who has prescribed, she does not need to take this recurrently.  4) patient follows with psychiatry, [name] will need to check with psychiatrist  5) Zithromax as an antibiotic, she completed this regimen, this can be removed from her list.  6) dicyclomine is an antibiotic for which patient has completed, this will be removed from her regimen.

## 2020-09-11 NOTE — Telephone Encounter (Signed)
[name] a nurse with Palliative care called asking about patient medicine. Patient told her that Dr [name] has stopped her Anastrozole, but it is still on her medicine list from another doctor as well as Tamoxifen. She is asking what the patient is taking. Per office note 09/08/20, her Tamoxifen was day/c'd in Dec and she was started on Anastrozole, but the note does not say that it has also been stopped as well the note is incomplete at this time. Please advise

## 2020-09-14 IMAGING — CT CT HEAD W/O CM
3 series · 16 of 46 positions shown, 19 images · non-contrast
Comparison: 09/23/2019

CLINICAL DATA: Left-sided weakness

EXAM:
CT HEAD WITHOUT CONTRAST
TECHNIQUE: Contiguous axial images were obtained from the base of the skull
through the vertex without intravenous contrast.

[Series 2: head wo · axial · 0.41mm/px · z∈[-175,-55]mm · 10 of 29 slices shown, 13 images]
[im 3/29  brain]
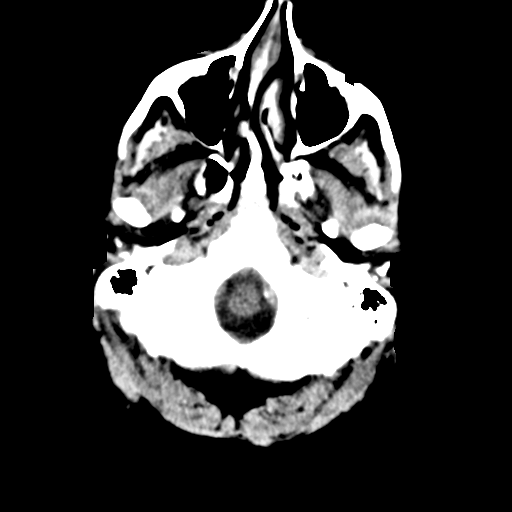
[im 3/29  bone]
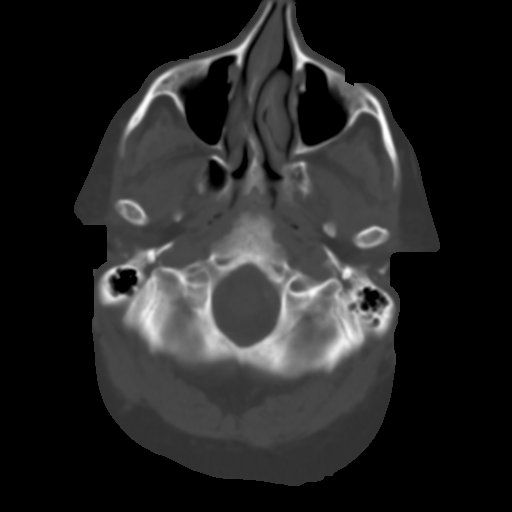
[im 6/29  brain]
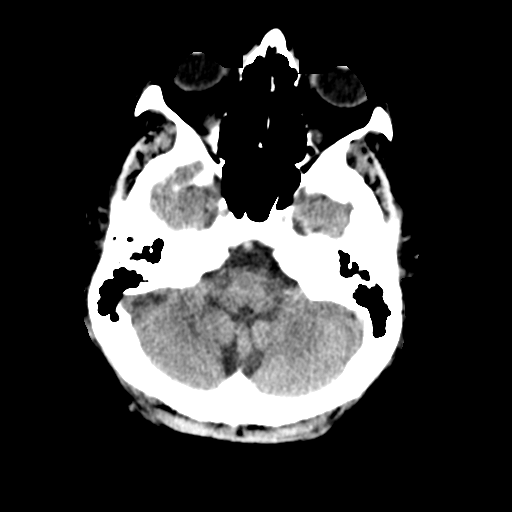
[im 8/29  brain]
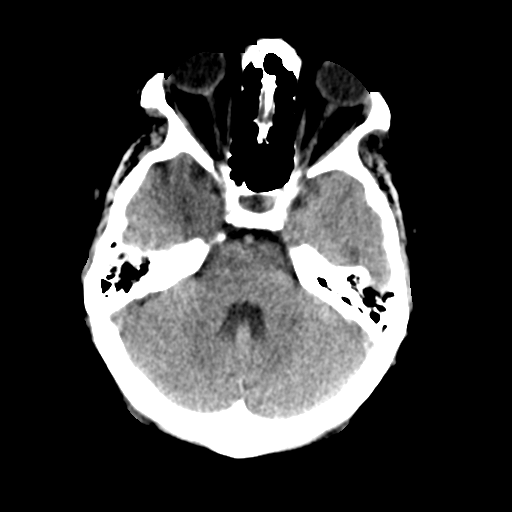
[im 11/29  brain]
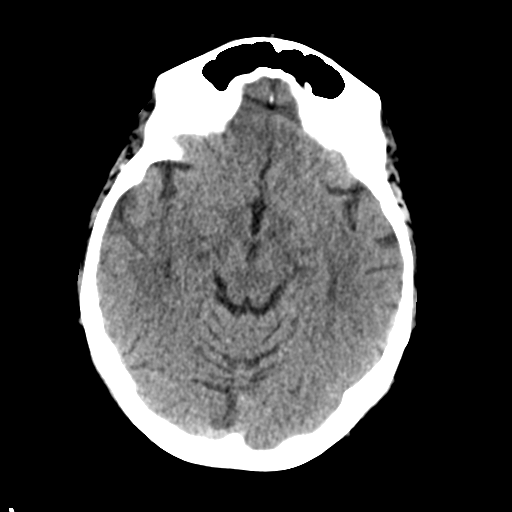
[im 14/29  brain]
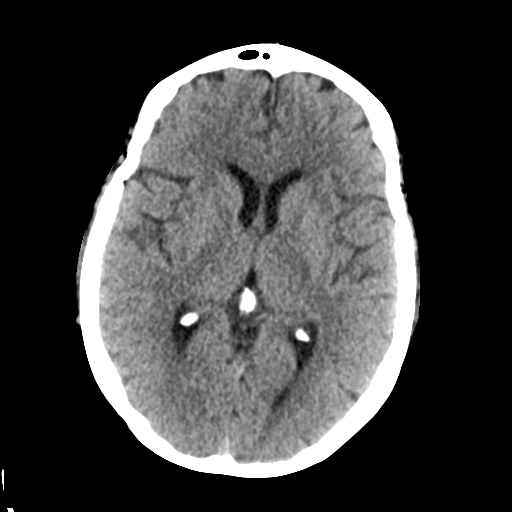
[im 14/29  bone]
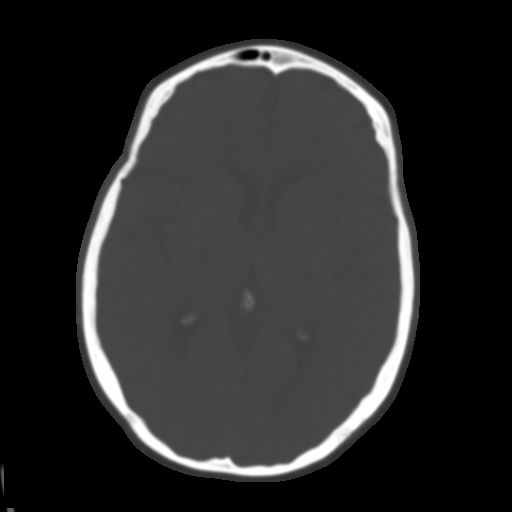
[im 16/29  brain]
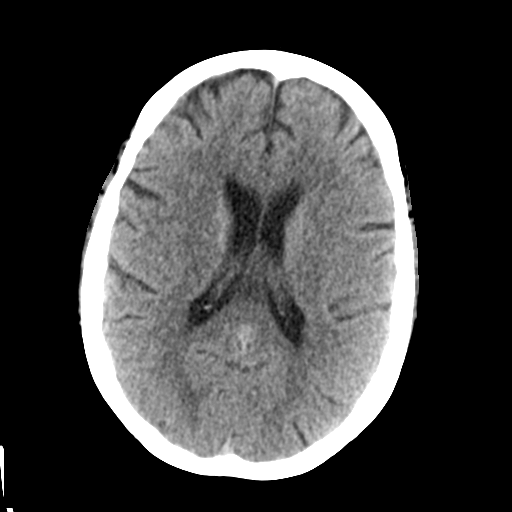
[im 19/29  brain]
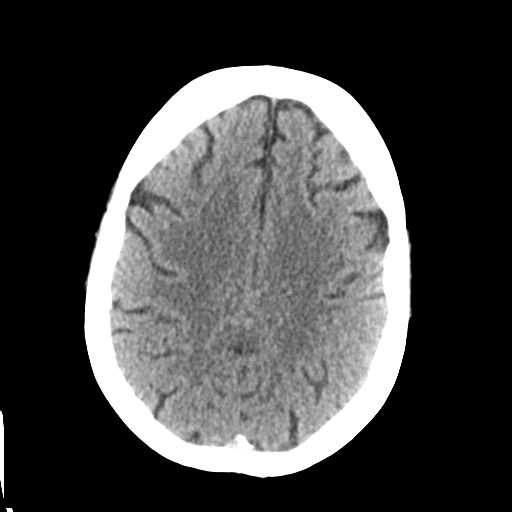
[im 22/29  brain]
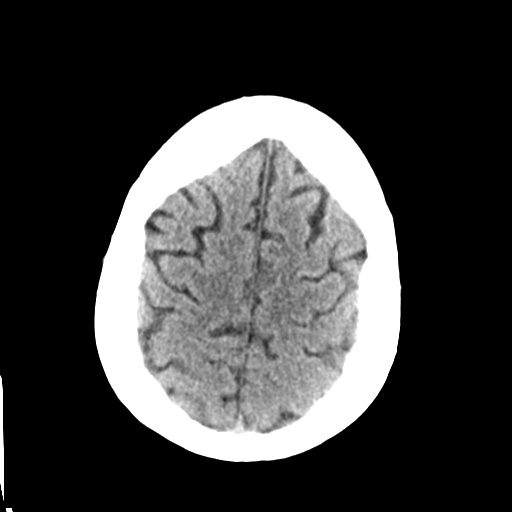
[im 24/29  brain]
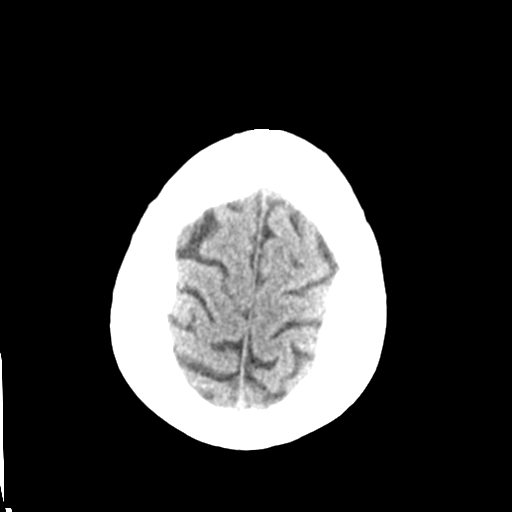
[im 24/29  bone]
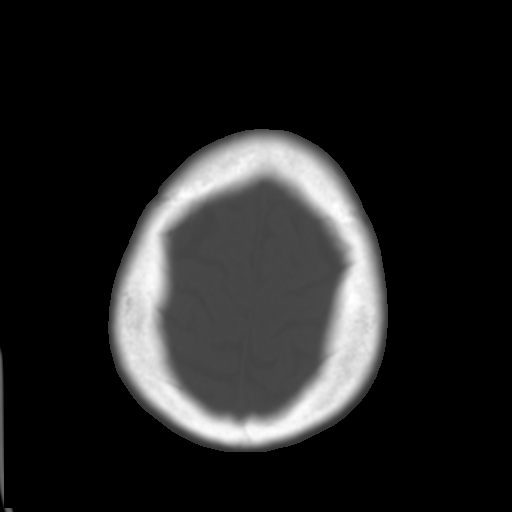
[im 27/29  brain]
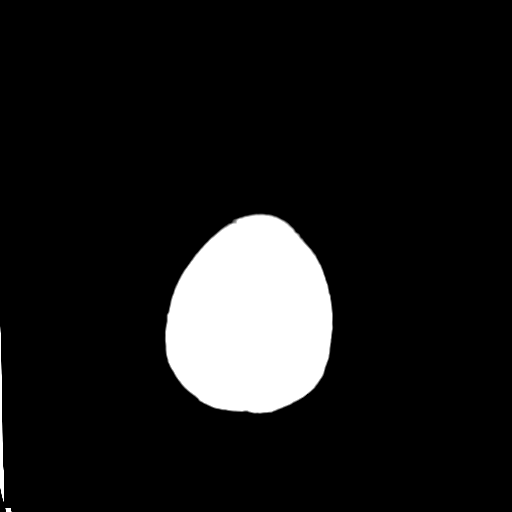

[Series 4: coronal soft tissue · coronal · 0.28mm/px · 3 of 64 slices shown]
[im 22/64  brain]
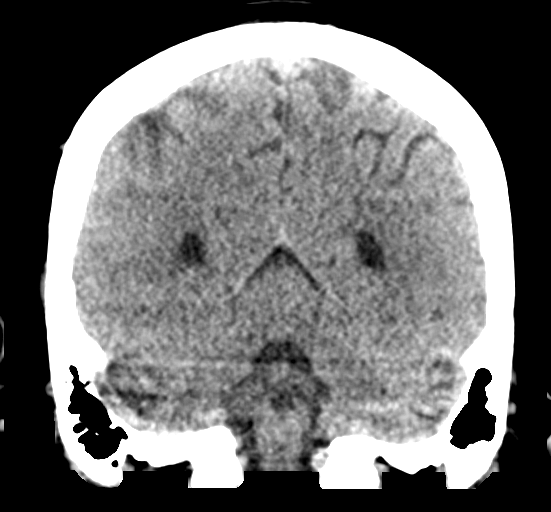
[im 29/64  brain]
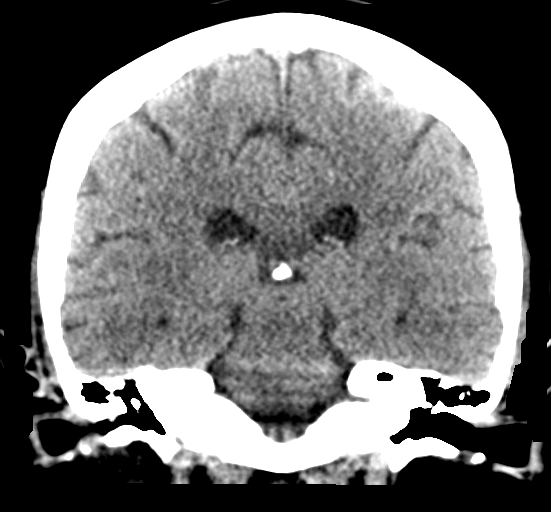
[im 36/64  brain]
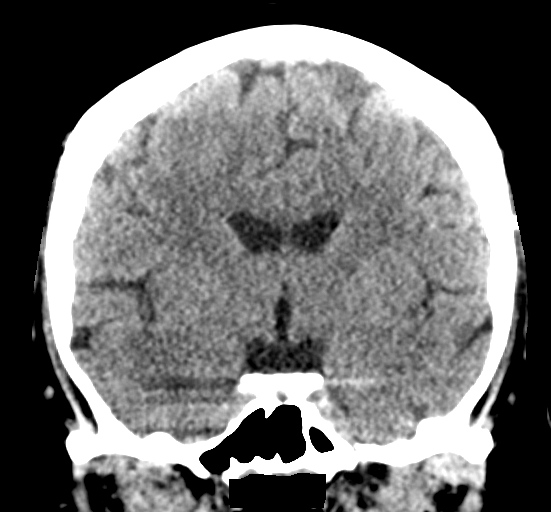

[Series 5: sagittal soft tissue · sagittal · 0.28mm/px · 3 of 49 slices shown]
[im 17/49  brain]
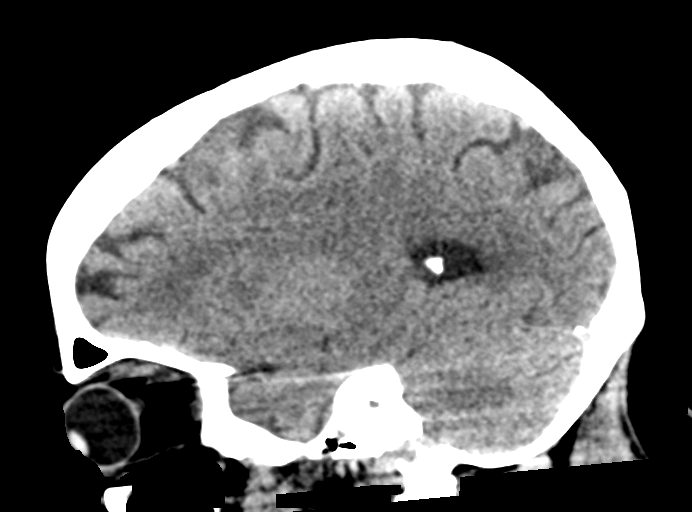
[im 25/49  brain]
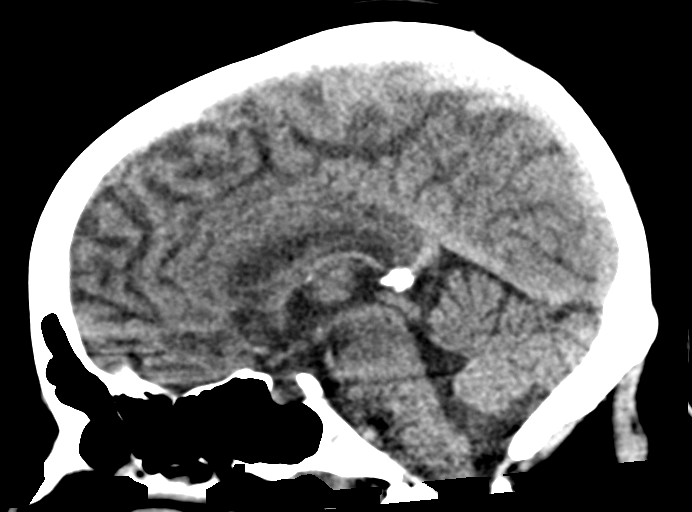
[im 33/49  brain]
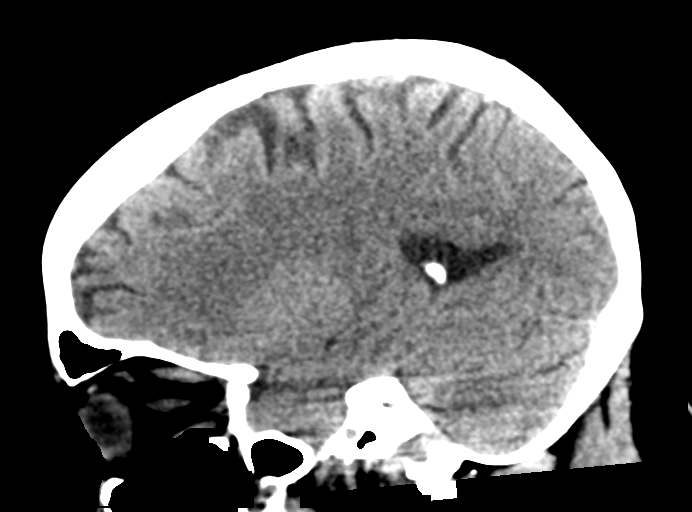

[16 of 46 positions shown; findings below may reference images not displayed]

FINDINGS: Brain: No evidence of acute infarction, hemorrhage, hydrocephalus,
extra-axial collection or mass lesion/mass effect.

Vascular: No hyperdense vessel or unexpected calcification.

Skull: Normal. Negative for fracture or focal lesion.

Sinuses/Orbits: No acute finding.

Other: Stable BB is noted within the left parietal scalp.
IMPRESSION: No acute intracranial abnormality is noted.

## 2020-09-14 IMAGING — CR DG CHEST 2V
2 series · 2 of 2 positions shown · non-contrast
Comparison: 11/27/2019

CLINICAL DATA: Weakness

EXAM:
CHEST - 2 VIEW

[chest lat]
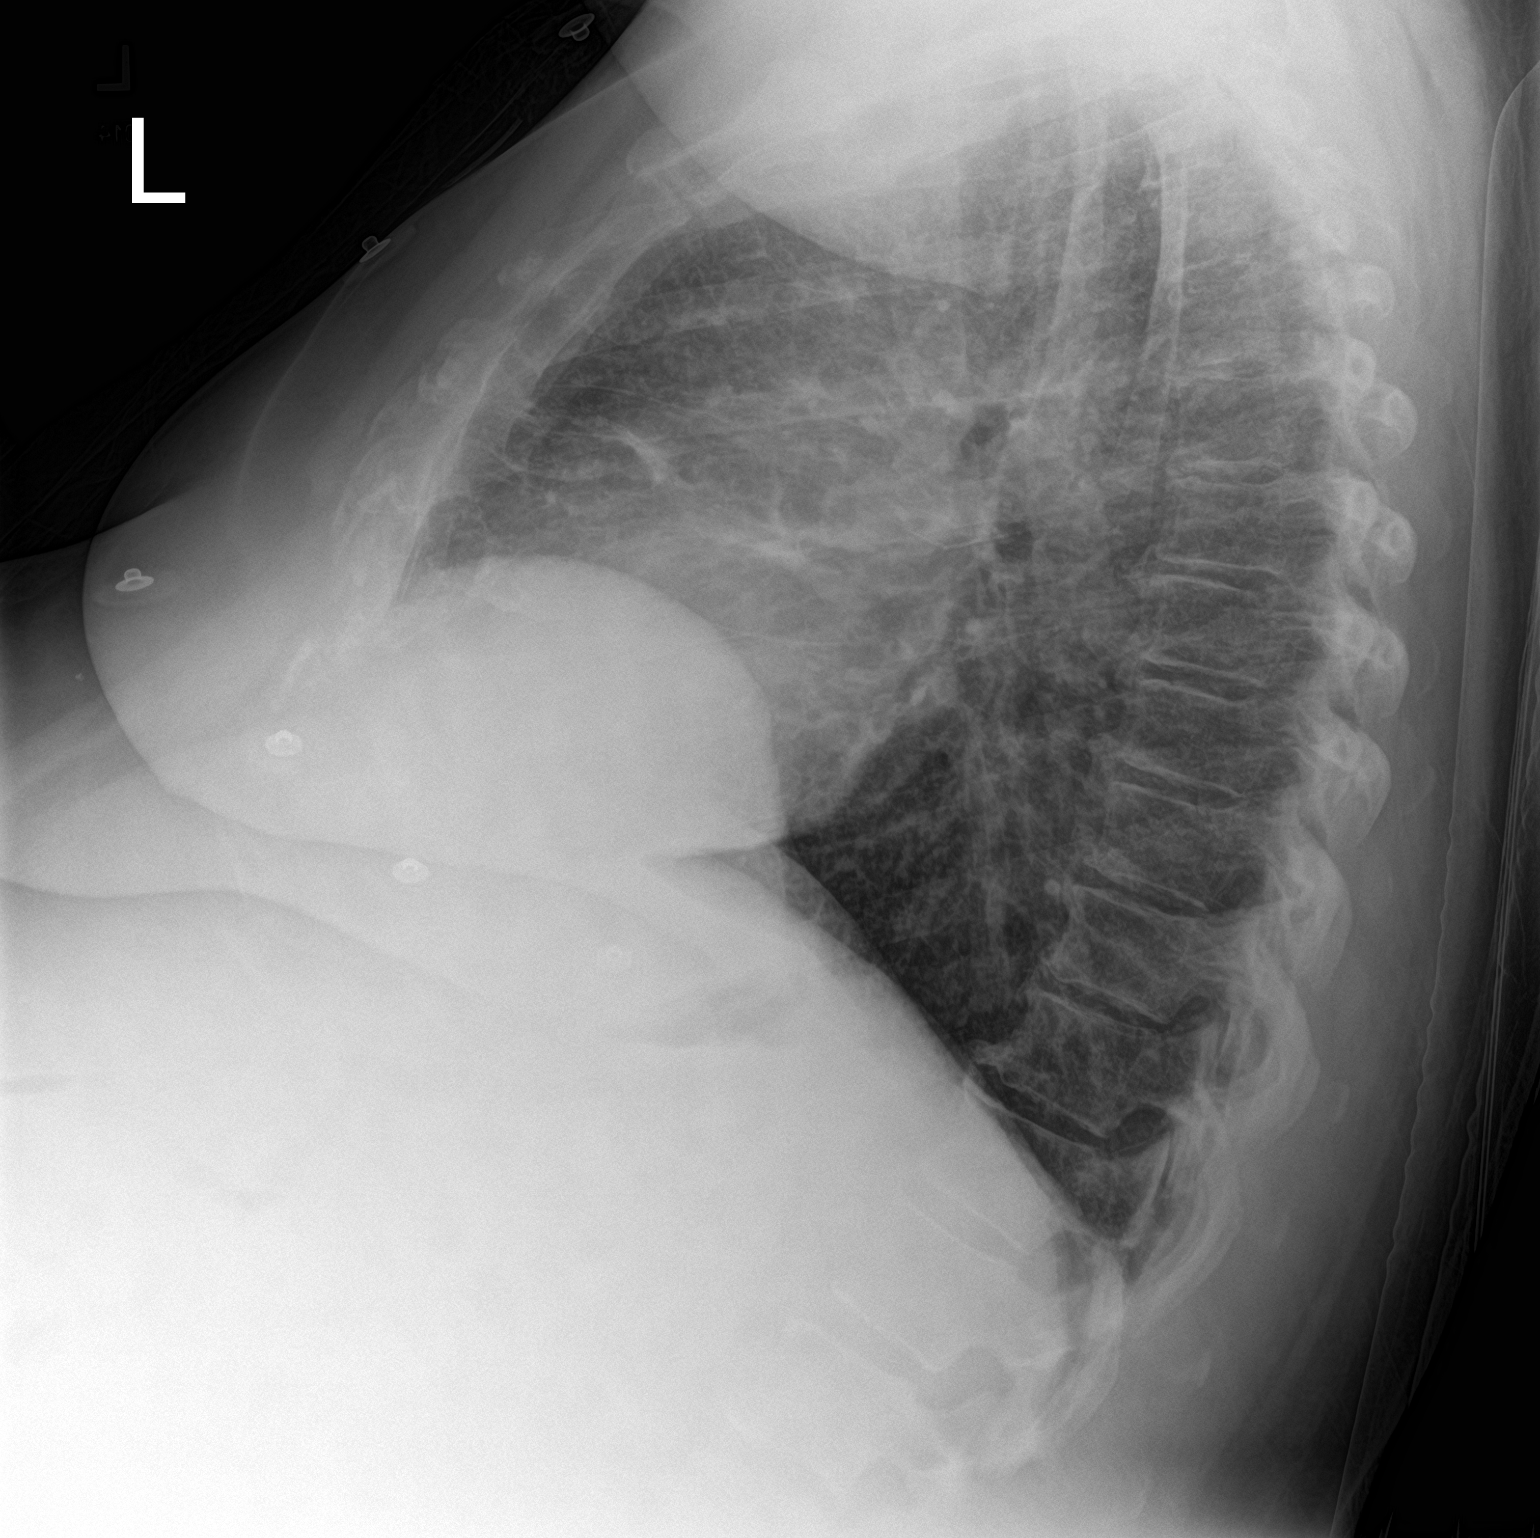

[chest ap]
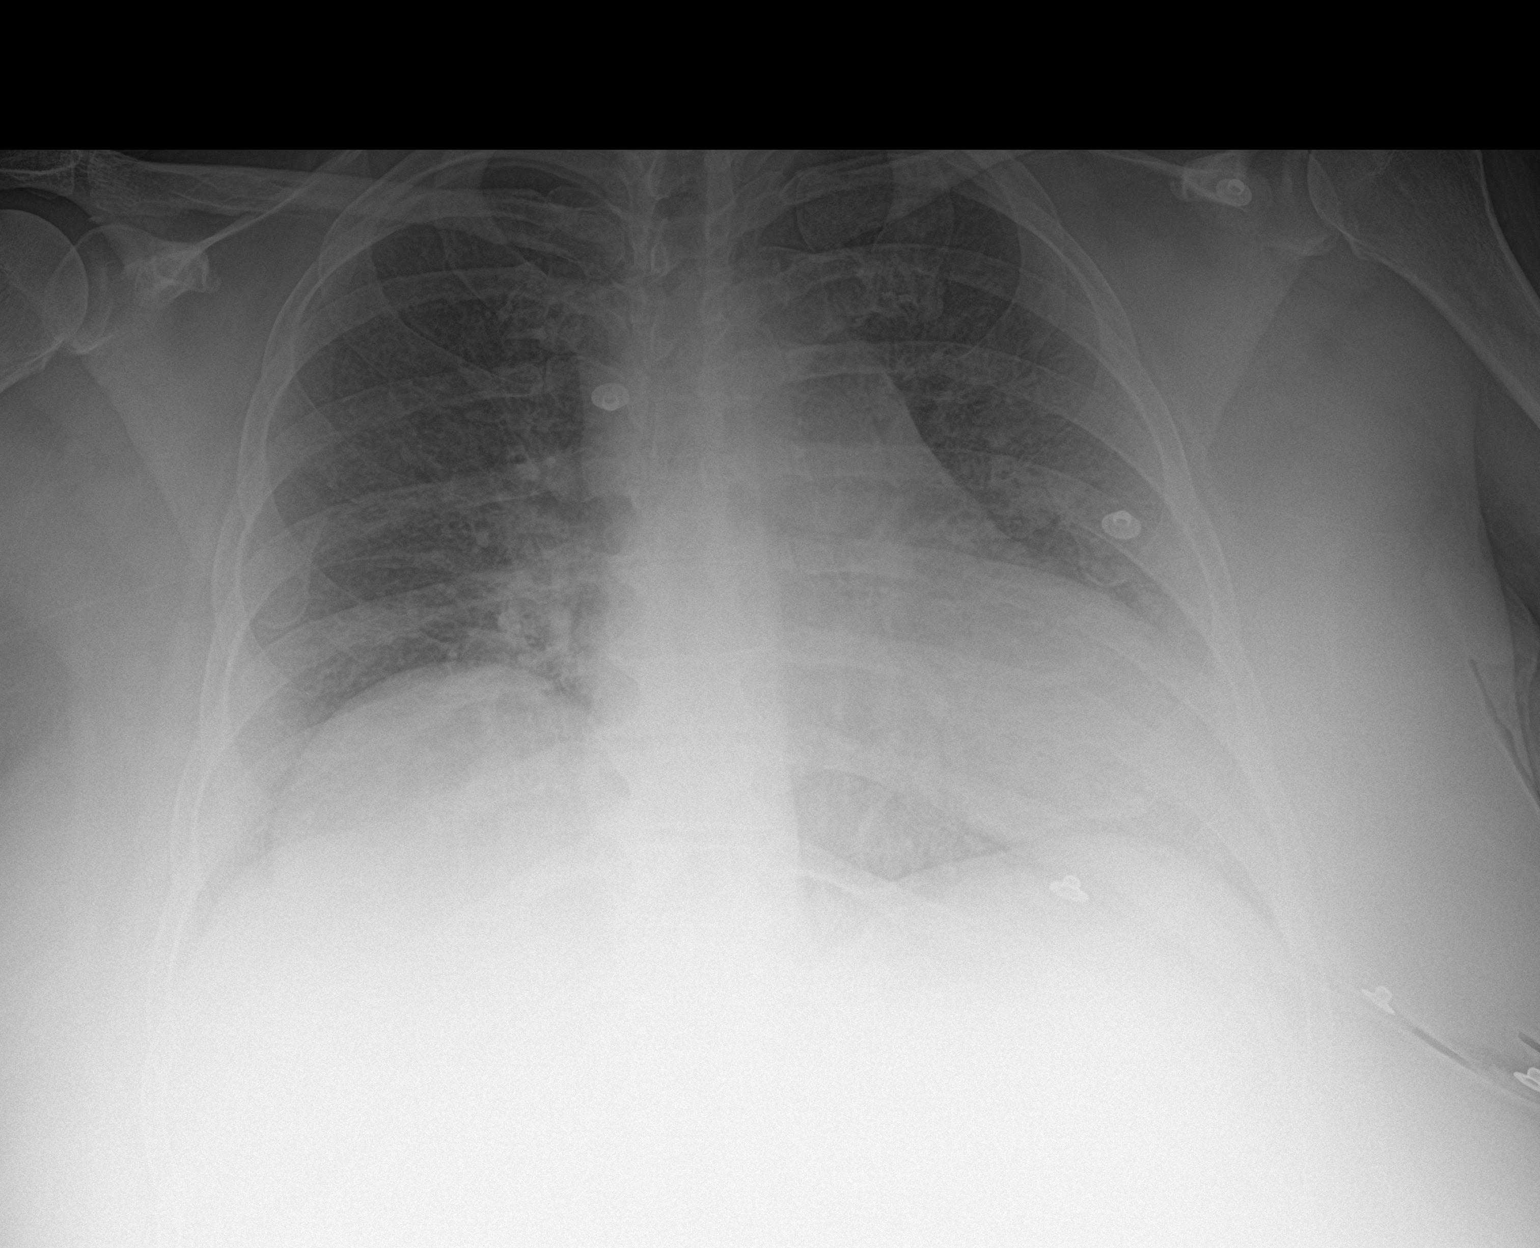

[2 of 2 positions shown; findings below may reference images not displayed]

FINDINGS: Lungs are clear.  No pleural effusion or pneumothorax.

The heart is normal in size.

Mild degenerative changes of the visualized thoracolumbar spine.
IMPRESSION: Normal chest radiographs.

## 2020-09-14 NOTE — Telephone Encounter (Signed)
[name] - Dr. B did not tell the patient to stop her Anastrozole.

## 2020-09-14 NOTE — Telephone Encounter (Signed)
Left message to return call to our office for nurse. Per her voicemail she does not work on [employer].

## 2020-09-14 NOTE — Telephone Encounter (Signed)
Call returned and had to leave a message on [name]'s voice mail with response

## 2020-09-15 ENCOUNTER — Telehealth (INDEPENDENT_AMBULATORY_CARE_PROVIDER_SITE_OTHER): Payer: Self-pay | Admitting: Vascular Surgery

## 2020-09-15 DIAGNOSIS — J449 Chronic obstructive pulmonary disease, unspecified: Secondary | ICD-10-CM | POA: Diagnosis not present

## 2020-09-15 NOTE — Telephone Encounter (Signed)
We do not treat incisions on hands.  In fact, if the incision isn't related to a vascular cause, we don't treat it.  We don't even do sutures in office.  She should go to urgent care.

## 2020-09-15 NOTE — Telephone Encounter (Signed)
Noted, glad to know her leg is looking better.

## 2020-09-15 NOTE — Telephone Encounter (Signed)
Called stating that she recently fell and has a small incision on her pointer finger on her right hand. Patient states she has been cleaning it herself and noticed that the incision has gotten bigger and would like to come in to be seen. I asked patient if she had reached out to her PCP and she stated that she contacted her PCP and they told her to call us ([facility]) to be seen. Patient was last seen 07/29/20 bil unna boot. Please advise.

## 2020-09-15 NOTE — Telephone Encounter (Signed)
Patient was made aware with medical recommendations and verbalized understanding 

## 2020-09-15 NOTE — Telephone Encounter (Signed)
Received call from [name], gave all information on last message. She wanted to let know she called patient to follow up on her leg today. She did not go to urgent care states that it is much better. Patient education provided by [name] over the phone on symptoms of blood clot and when to have evaluation and treatment. [name] felt like all instructions were understood but not sure if patient will follow guidance provided.

## 2020-09-16 ENCOUNTER — Ambulatory Visit: Payer: BC Managed Care – PPO

## 2020-09-18 ENCOUNTER — Telehealth: Payer: Self-pay | Admitting: *Deleted

## 2020-09-18 DIAGNOSIS — I96 Gangrene, not elsewhere classified: Secondary | ICD-10-CM | POA: Diagnosis not present

## 2020-09-18 DIAGNOSIS — S6991XA Unspecified injury of right wrist, hand and finger(s), initial encounter: Secondary | ICD-10-CM | POA: Diagnosis not present

## 2020-09-18 NOTE — Telephone Encounter (Signed)
Noted, will watch for UC notes.

## 2020-09-18 NOTE — Telephone Encounter (Signed)
Pt called in asking if PCP could see her today. Checked with PCP and she is completley booked. Pt said she fell off of her ramp 2 weeks ago and injured her right middle finger. Pt said it's still swollen and it has a laceration on it that's red and not healing as fast as she thinks it should have. Due to no appts in office until Tuesday, pt was advised per PCP to go to UC to have it evaluated or schedule appt next week if pt thinks she can wait. Per pt she will go to closest UC near her.  FYI to PCP

## 2020-09-22 ENCOUNTER — Other Ambulatory Visit: Payer: Self-pay

## 2020-09-22 ENCOUNTER — Other Ambulatory Visit: Payer: Self-pay | Admitting: Student

## 2020-09-22 ENCOUNTER — Ambulatory Visit (INDEPENDENT_AMBULATORY_CARE_PROVIDER_SITE_OTHER): Payer: BC Managed Care – PPO | Admitting: Family

## 2020-09-22 ENCOUNTER — Ambulatory Visit: Payer: Self-pay

## 2020-09-22 DIAGNOSIS — S6010XA Contusion of unspecified finger with damage to nail, initial encounter: Secondary | ICD-10-CM

## 2020-09-22 DIAGNOSIS — M79644 Pain in right finger(s): Secondary | ICD-10-CM | POA: Diagnosis not present

## 2020-09-22 DIAGNOSIS — R234 Changes in skin texture: Secondary | ICD-10-CM

## 2020-09-22 MED ORDER — SULFAMETHOXAZOLE-TRIMETHOPRIM 800-160 MG PO TABS: 1.0000 | ORAL_TABLET | Freq: Two times a day (BID) | ORAL | 0 refills | Status: DC

## 2020-09-23 NOTE — Progress Notes (Signed)
Office Visit Note   Patient: [name]           Date of Birth: [birth year]           MRN: [id number] Visit Date: 09/22/2020              Requested by: Pleas Koch, NP [place],  [place] [address] PCP: Pleas Koch, NP  No chief complaint on file.     HPI: The patient is a 50 year old woman who presents today concerned for slow healing wound to her right middle finger.  She fell about 3 weeks ago on a metal ramp landed on outstretched hand on the right.  She states her nail immediately took on darkened discoloration and she had a opening or split on the pad of her finger.  She was seen in urgent care placed on antibiotics.  At that time she was advised she was at risk for amputation of the long finger.  She is diabetic.  Assessment & Plan: Visit Diagnoses:  1. Fissure in skin   2. Pain in right finger(s)   3. Subungual hematoma of digit of hand, initial encounter     Plan: She will begin cleansing the finger with Dial soap.  Given mupirocin to apply daily.  Keep this covered.  Discussed that she may lose the fingernail.  She will follow-up in the office in 2 weeks.  Follow-Up Instructions: No follow-ups on file.   Ortho Exam  Patient is alert, oriented, no adenopathy, well-dressed, normal affect, normal respiratory effort. Strong radial pulse. On examination of the right long finger she does have a fissure that is 15 mm in length.  This is about 1 mm wide.  There is granulation in the wound bed this does not appear to probe there is no active drainage no surrounding erythema she does have some surrounding callus and nonviable tissue.  There is subungual hematoma no active bleeding or drainage.  Minimal tenderness.   Imaging: No results found. No images are attached to the encounter.  Labs: Lab Results  Component Value Date   HGBA1C 11.1 (A) 07/01/2020   HGBA1C 11.8 (H) 11/18/2019   HGBA1C 10.6 (H) 09/14/2018   ESRSEDRATE 56 (H) 01/08/2018    CRP 53 (H) 01/08/2018   REPTSTATUS 08/25/2019 FINAL 08/21/2019   GRAMSTAIN  08/21/2019    NO WBC SEEN RARE GRAM POSITIVE COCCI IN PAIRS Performed at [facility], [place] [address], [place], [place] [address]    CULT FEW STAPHYLOCOCCUS AUREUS 08/21/2019   LABORGA STAPHYLOCOCCUS AUREUS 08/21/2019     Lab Results  Component Value Date   ALBUMIN 3.5 06/29/2020   ALBUMIN 3.3 (L) 05/17/2020   ALBUMIN 3.7 05/15/2020    Lab Results  Component Value Date   MG 2.1 02/04/2018   MG 2.1 02/02/2018   MG 2.2 01/08/2018   No results found for: VD25OH  No results found for: PREALBUMIN CBC EXTENDED Latest Ref Rng & Units 08/03/2020 06/29/2020 05/17/2020  WBC 4.0 - 10.5 K/uL 8.8 11.4(H) 8.6  RBC 3.87 - 5.11 MIL/uL 5.51(H) 5.14(H) 5.21(H)  HGB 12.0 - 15.0 g/dL 14.2 13.0 12.4  HCT 36.0 - 46.0 % 46.7(H) 40.3 39.2  PLT 150 - 400 K/uL 173 172 176  NEUTROABS 1.7 - 7.7 K/uL - 9.2(H) 6.6  LYMPHSABS 0.7 - 4.0 K/uL - 1.1 1.1     There is no height or weight on file to calculate BMI.  Orders:  No orders of the  defined types were placed in this encounter.  Meds ordered this encounter  Medications  . sulfamethoxazole-trimethoprim (BACTRIM DS) 800-160 MG tablet    Sig: Take 1 tablet by mouth 2 (two) times daily.    Dispense:  20 tablet    Refill:  0     Procedures: No procedures performed  Clinical Data: No additional findings.  ROS:  All other systems negative, except as noted in the HPI. Review of Systems  Objective: Vital Signs: There were no vitals taken for this visit.  Specialty Comments:  No specialty comments available.  PMFS History: Patient Active Problem List   Diagnosis Date Noted  . Acute midline low back pain without sciatica 09/01/2020  . Microscopic hematuria 07/01/2020  . Pulmonary nodules 07/01/2020  . Hav (hallux abducto valgus), unspecified laterality 06/11/2020  . Venous ulcer of ankle, left ([place]) 05/15/2020  . Contracture of hand joint, left  05/15/2020  . Cellulitis and abscess of trunk 03/12/2020  . Status post mastectomy, right 01/14/2020  . Acute kidney injury superimposed on CKD ([place])   . Breast cancer, right breast ([place]) 12/23/2019  . Hyperlipidemia   . Carcinoma of lower-outer quadrant of right breast in female, estrogen receptor positive ([place]) 11/12/2019  . Restrictive lung disease 10/08/2019  . Postmenopausal bleeding 08/30/2019  . Carotid stenosis, asymptomatic, right 08/16/2019  . Nausea and vomiting 07/15/2019  . Cellulitis of left lower extremity 06/04/2019  . Multiple wounds of skin 06/04/2019  . Other injury of unspecified body region, initial encounter 06/04/2019  . Left-sided weakness 09/13/2018  . Weakness of left lower extremity 09/05/2018  . Recurrent falls 09/05/2018  . PAD (peripheral artery disease) ([place]) 08/27/2018  . Chronic congestive heart failure ([place]) 08/02/2018  . Vaginal itching 06/15/2018  . Foot pain, bilateral 05/22/2018  . Chronic upper extremity pain (Secondary Area of Pain) (Bilateral) 04/23/2018  . Diabetic polyneuropathy associated with type 2 diabetes mellitus ([place]) 04/23/2018  . Long term prescription benzodiazepine use 04/23/2018  . Neurogenic pain 04/23/2018  . Vitamin D insufficiency 01/29/2018  . Elevated C-reactive protein (CRP) 01/29/2018  . Elevated sedimentation rate 01/29/2018  . Pressure injury of skin 01/29/2018  . Poorly controlled type 2 diabetes mellitus ([place]) 01/23/2018  . DNR (do not resuscitate) 01/23/2018  . CKD stage 3 due to type 2 diabetes mellitus ([place]) 01/23/2018  . IBS (irritable bowel syndrome) 01/19/2018  . Elevated serum hCG 01/19/2018  . Chronic lower extremity pain (Primary Area of Pain) (Bilateral) 01/08/2018  . Chronic low back pain [facility] Area of Pain) (Bilateral) w/ sciatica (Bilateral) 01/08/2018  . Chronic pain syndrome 01/08/2018  . Pharmacologic therapy 01/08/2018  . Disorder of skeletal system 01/08/2018  . Problems influencing health  status 01/08/2018  . Long term current use of opiate analgesic 01/08/2018  . Restless leg syndrome 08/02/2017  . Nocturnal hypoxemia 03/29/2017  . Vision disturbance 02/28/2017  . CKD (chronic kidney disease), stage II 02/28/2017  . Visual loss, bilateral   . Chronic respiratory failure with hypoxia (HCC)/ nocturnal 02 dep  10/28/2016  . Upper airway cough syndrome 08/22/2016  . Cigarette smoker 06/15/2016  . Simple chronic bronchitis ([place])   . Coronary artery disease involving native heart without angina pectoris 05/16/2016  . Chronic diastolic CHF (congestive heart failure) ([place]) 11/04/2015  . Orthopnea 11/03/2015  . Bilateral leg edema   . Diabetes ([place])   . COPD exacerbation ([place]) 10/15/2015  . Fracture of rib, closed 10/15/2015  . Venous stasis of both lower extremities 03/29/2015  . Preventative health  care 02/04/2015  . Essential hypertension 01/02/2015  . Inguinal hernia 11/27/2014  . Allergic rhinitis with postnasal drip 01/21/2014  . Aortic valve disorders 04/18/2013  . Sleep apnea 05/22/2012  . Syncope and collapse 10/09/2011  . Seizure disorder ([place]) 10/09/2011  . Bipolar disorder ([place]) 10/09/2011  . TIA (transient ischemic attack) 06/22/2011  . Constipation 06/15/2011  . HYPERTRIGLYCERIDEMIA 07/17/2007  . Situational anxiety 05/30/2007  . End stage COPD ([place]) 05/30/2007  . Morbid (severe) obesity due to excess calories ([place]) 04/23/2007  . Tobacco abuse 09/07/2006   Past Medical History:  Diagnosis Date  . Anxiety    takes Prozac daily  . Anxiety   . Aortic valve calcification   . Asthma    Advair and Spirva daily  . Asthma   . Bipolar disorder ([place])   . CAD (coronary artery disease)    a. LHC 11/2013 done for CP/fluid retention: mild disease in prox LAD, mild-mod disease in mRCA, EF 60% with normal LVEDP. b. Normal nuc 03/2016.  Marland Kitchen Cancer ([place])   . CHF (congestive heart failure) ([place])   . Chronic diastolic CHF (congestive heart failure) ([place])   . Chronic  heart failure with preserved ejection fraction ([place]) 11/16/2018  . CKD (chronic kidney disease), stage II   . COPD (chronic obstructive pulmonary disease) (HCC)    a. nocturnal O2.  Marland Kitchen COPD (chronic obstructive pulmonary disease) ([place])   . Coronary artery disease   . Decreased urine stream   . Diabetes mellitus   . Diabetes mellitus without complication ([place])   . Dyspnea   . Family history of adverse reaction to anesthesia    mom gets nauseated  . GERD (gastroesophageal reflux disease)    takes Pepcid daily  . History of blood clots    left leg 3-55yrs ago  . Hyperlipidemia   . Hypertension   . Hypertriglyceridemia   . Inguinal hernia, left 01/2015  . Muscle spasm   . Open wound of genital labia   . Peripheral neuropathy   . RBBB   . Seizures ([place])   . Sepsis ([place]) 01/19/2018  . Sinus tachycardia    a. persistent since 2009.  Marland Kitchen Smokers' cough ([place])   . Stroke [facility]) 1989   left sided weakness  . TIA (transient ischemic attack)   . Tobacco abuse   . Vulvar abscess 01/23/2018    Family History  Problem Relation Age of Onset  . Venous thrombosis Brother   . Other Brother        BRAIN TUMOR  . Asthma Father   . Diabetes Father   . Coronary artery disease Mother   . Hypertension Mother   . Diabetes Mother   . Breast cancer Mother 78  . Asthma Sister   . Diabetes type II Brother     Past Surgical History:  Procedure Laterality Date  . APPLICATION OF WOUND VAC Right 01/29/2020   Procedure: APPLICATION OF WOUND VAC;  Surgeon: Ronny Bacon, MD;  Location: [facility];  Service: General;  Laterality: Right;  . BREAST BIOPSY Right 11/06/2019   [place] core path pending venus clip  . HERNIA REPAIR Left   . INCISION AND DRAINAGE ABSCESS N/A 01/26/2018   Procedure: INCISION AND DEBRIDEMENT OF VULVAR NECROTIZING SOFT TISSUE INFECTION;  Surgeon: Greer Pickerel, MD;  Location: [place];  Service: General;  Laterality: N/A;  . INCISION AND DRAINAGE PERIRECTAL ABSCESS N/A 01/22/2018   Procedure:  IRRIGATION AND DEBRIDEMENT LABIAL/VULVAR AREA;  Surgeon: Coralie Keens, MD;  Location: [place];  Service: General;  Laterality: N/A;  . INCISION AND DRAINAGE PERIRECTAL ABSCESS N/A 01/29/2018   Procedure: IRRIGATION AND DEBRIDEMENT VULVA;  Surgeon: Excell Seltzer, MD;  Location: [place];  Service: General;  Laterality: N/A;  . INGUINAL HERNIA REPAIR Left 04/08/2015   Procedure: OPEN LEFT INGUINAL HERNIA REPAIR WITH MESH;  Surgeon: Ralene Ok, MD;  Location: [place];  Service: General;  Laterality: Left;  . INSERTION OF MESH Left 04/08/2015   Procedure: INSERTION OF MESH;  Surgeon: Ralene Ok, MD;  Location: [place];  Service: General;  Laterality: Left;  . LAPAROSCOPY     Endometriosis  . LEFT HEART CATHETERIZATION WITH CORONARY ANGIOGRAM N/A 12/05/2013   Procedure: LEFT HEART CATHETERIZATION WITH CORONARY ANGIOGRAM;  Surgeon: Jettie Booze, MD;  Location: [facility] CATH LAB;  Service: Cardiovascular;  Laterality: N/A;  . right kidney drained    . SIMPLE MASTECTOMY WITH AXILLARY SENTINEL NODE BIOPSY Right 12/23/2019   Procedure: Right SIMPLE MASTECTOMY WITH AXILLARY SENTINEL NODE BIOPSY;  Surgeon: Ronny Bacon, MD;  Location: [facility];  Service: General;  Laterality: Right;  . TEE WITHOUT CARDIOVERSION N/A 11/28/2013   Procedure: TRANSESOPHAGEAL ECHOCARDIOGRAM (TEE);  Surgeon: Thayer Headings, MD;  Location: [place];  Service: Cardiovascular;  Laterality: N/A;  . WOUND DEBRIDEMENT Right 01/29/2020   Procedure: DEBRIDEMENT WOUND, Excisional debridement skin, subcutaneous and muscle right chest wall;  Surgeon: Ronny Bacon, MD;  Location: [facility];  Service: General;  Laterality: Right;   Social History   Occupational History  . Not on file  Tobacco Use  . Smoking status: Current Some Day Smoker    Packs/day: 0.25    Years: 36.00    Pack years: 9.00    Types: Cigarettes    Start date: 07/11/1981  . Smokeless tobacco: Never Used  . Tobacco comment: 1 ppd now  Vaping Use  .  Vaping Use: Some days  Substance and Sexual Activity  . Alcohol use: No  . Drug use: No  . Sexual activity: Not Currently

## 2020-09-25 ENCOUNTER — Ambulatory Visit: Payer: BC Managed Care – PPO | Admitting: Physician Assistant

## 2020-09-29 ENCOUNTER — Ambulatory Visit (INDEPENDENT_AMBULATORY_CARE_PROVIDER_SITE_OTHER): Payer: BC Managed Care – PPO | Admitting: Orthopedic Surgery

## 2020-09-29 ENCOUNTER — Ambulatory Visit: Payer: Self-pay

## 2020-09-29 ENCOUNTER — Telehealth: Payer: Self-pay

## 2020-09-29 ENCOUNTER — Encounter: Payer: Self-pay | Admitting: Orthopedic Surgery

## 2020-09-29 DIAGNOSIS — L03011 Cellulitis of right finger: Secondary | ICD-10-CM

## 2020-09-29 DIAGNOSIS — M79644 Pain in right finger(s): Secondary | ICD-10-CM | POA: Diagnosis not present

## 2020-09-29 MED ORDER — DOXYCYCLINE HYCLATE 100 MG PO TABS: 100.0000 mg | ORAL_TABLET | Freq: Two times a day (BID) | ORAL | 0 refills | Status: DC

## 2020-09-29 MED ORDER — MUPIROCIN 2 % EX OINT: 1.0000 "application " | TOPICAL_OINTMENT | Freq: Two times a day (BID) | CUTANEOUS | 3 refills | Status: DC

## 2020-09-29 NOTE — Telephone Encounter (Signed)
[place] [facility] - Client TELEPHONE ADVICE RECORD Access[facility]urse Patient Name: [name] Gender: Female DOB: 10/20/1932 Age: 50 Y 11 M 11 D Return Phone Number: [phone] (Primary) Address: City/State/Zip: [place] [address] Client [facility] [facility] - Client Client Site [place] Physician Renford Dills - MD Contact Type Call Who Is Calling Patient / Member / Family / Caregiver Call Type Triage / Clinical Caller Name None given Relationship To Patient Daughter Return Phone Number [phone] (Primary) Chief Complaint Dizziness Reason for Call Symptomatic / Request for [place] states he has high sugar of 185 and fell last night Sx of dizziness.He had head surgery last week. Translation No Nurse Assessment Nurse: Claiborne Billings, RN, Kim Date/Time (Eastern Time): 09/29/2020 10:12:05 AM Confirm and document reason for call. If symptomatic, describe symptoms. ---Caller states her father has been having dizzy spells, told her he fell twice last night due to feeling dizzy and off balance. Blood sugar this morning was 185. States no visible injuries from falls last night. Does the patient have any new or worsening symptoms? ---Yes Will a triage be completed? ---Yes Related visit to physician within the last 2 weeks? ---Yes Does the PT have any chronic conditions? (i.e. diabetes, asthma, this includes High risk factors for pregnancy, etc.) ---Yes List chronic conditions. ---Diabetes, Had skin cancer removed from head last week. Is this a behavioral health or substance abuse call? ---No Guidelines Guideline Title Affirmed Question Affirmed Notes Nurse Date/Time (Eastern Time) Dizziness - Lightheadedness SEVERE dizziness (e.g., unable to stand, requires support to walk, feels like passing out now) Claiborne Billings, Therapist, sports, Maudie Mercury 09/29/2020 10:14:48 AM Disp. Time Eilene Ghazi Time) Disposition Final  User 09/29/2020 10:20:02 AM Go to ED Now (or PCP triage) Yes Claiborne Billings, RN, Kim PLEASE NOTE: All timestamps contained within this report are represented as [place] Standard Time. CONFIDENTIALTY NOTICE: This fax transmission is intended only for the addressee. It contains information that is legally privileged, confidential or otherwise protected from use or disclosure. If you are not the intended recipient, you are strictly prohibited from reviewing, disclosing, copying using or disseminating any of this information or taking any action in reliance on or regarding this information. If you have received this fax in error, please notify us immediately by telephone so that we can arrange for its return to [place]. Phone: [phone], Toll-Free: [phone], Fax: [phone] Page: 2 of 2 Call Id: [id number] Caller Disagree/Comply Comply Caller Understands Yes PreDisposition Did not know what to do Care Advice Given Per Guideline GO TO ED NOW (OR PCP TRIAGE): * IF NO PCP (PRIMARY CARE PROVIDER) SECOND-LEVEL TRIAGE: You need to be seen within the next hour. Go to the [facility] at _____________ [place] as soon as you can. ANOTHER ADULT SHOULD DRIVE: * It is better and safer if another adult drives instead of you. BRING MEDICINES: * Bring a list of your current medicines when you go to the Emergency Department (ER). CARE ADVICE given per Dizziness (Adult) guideline. Referrals GO TO FACILITY UNDECIDED

## 2020-09-29 NOTE — Telephone Encounter (Signed)
Noted, will evaluate. 

## 2020-09-29 NOTE — Telephone Encounter (Signed)
Spoke with patient who stated that she has been experiencing intermittent dizziness for about two weeks now. Patient stated, "It feels like when I am standing the world starts spinning sometimes. I have fallen twice because of it." Patient stated that the only injury that she has had was to her R hand middle finger, which she is seeing Dr. Sharol Given for. Patient reports that sometimes she forgets what she is doing and she becomes confused, but this does not last long. Patient denies SOB, chest pain, headache, slurred speech, weakness or syncope. Patient states that her blood pressure and pulse have been normal, but her blood sugar has been running high (patient was unable to provide exact readings). Patient has an appointment with Allie Bossier, NP on Thursday (3/24) at 11:20. Informed patient that if she develops new or worsening symptoms she should report to UC or ED. Patient verbalized understanding. COVID screening negative.

## 2020-09-29 NOTE — Progress Notes (Signed)
Office Visit Note   Patient: [name]           Date of Birth: [birth year]           MRN: [id number] Visit Date: 09/29/2020              Requested by: Pleas Koch, NP [place],  [place] [address] PCP: Pleas Koch, NP  Chief Complaint  Patient presents with  . Right Middle Finger - Follow-up, Wound Check      HPI: Patient is called in a prescription for doxycycline and Bactroban.  She is used Bactroban 3 times a day keep her hand elevated.  Assessment & Plan: Visit Diagnoses:  1. Pain in right finger(s)   2. Cellulitis of right middle finger     Plan: A prescription for doxycycline and Bactroban or Coban.  Patient will stop her sulfamethoxazole trimethoprim.  Use the Bactroban 3 times a day keep her hand elevated.  Follow-Up Instructions: Return in about 3 weeks (around 10/20/2020).   Ortho Exam  Patient is alert, oriented, no adenopathy, well-dressed, normal affect, normal respiratory effort. Examination patient does not have sausage digit swelling of the right long finger she has good flexion extension no flexor tenosynovitis the palm and dorsum of her hand are not tender to palpation she has a strong radial pulse.  There is a small amount of redness and dorsally just distal to the DIP joint of the right long finger on the palmar aspect she has dry cracked fissuring the skin with healthy granulation tissue at the base this wound is 10 mm in diameter 1 mm wide.  She has a 50% subungual hematoma that is nonpainful.  Imaging: XR Finger Middle Right  Result Date: 09/29/2020 Three-view radiographs of the right middle finger shows no lytic bone abnormalities no evidence of osteomyelitis.  No images are attached to the encounter.  Labs: Lab Results  Component Value Date   HGBA1C 11.1 (A) 07/01/2020   HGBA1C 11.8 (H) 11/18/2019   HGBA1C 10.6 (H) 09/14/2018   ESRSEDRATE 56 (H) 01/08/2018   CRP 53 (H) 01/08/2018   REPTSTATUS 08/25/2019 FINAL  08/21/2019   GRAMSTAIN  08/21/2019    NO WBC SEEN RARE GRAM POSITIVE COCCI IN PAIRS Performed at [facility], [place] [address], [place],  [address]    CULT FEW STAPHYLOCOCCUS AUREUS 08/21/2019   LABORGA STAPHYLOCOCCUS AUREUS 08/21/2019     Lab Results  Component Value Date   ALBUMIN 3.5 06/29/2020   ALBUMIN 3.3 (L) 05/17/2020   ALBUMIN 3.7 05/15/2020    Lab Results  Component Value Date   MG 2.1 02/04/2018   MG 2.1 02/02/2018   MG 2.2 01/08/2018   No results found for: VD25OH  No results found for: PREALBUMIN CBC EXTENDED Latest Ref Rng & Units 08/03/2020 06/29/2020 05/17/2020  WBC 4.0 - 10.5 K/uL 8.8 11.4(H) 8.6  RBC 3.87 - 5.11 MIL/uL 5.51(H) 5.14(H) 5.21(H)  HGB 12.0 - 15.0 g/dL 14.2 13.0 12.4  HCT 36.0 - 46.0 % 46.7(H) 40.3 39.2  PLT 150 - 400 K/uL 173 172 176  NEUTROABS 1.7 - 7.7 K/uL - 9.2(H) 6.6  LYMPHSABS 0.7 - 4.0 K/uL - 1.1 1.1     There is no height or weight on file to calculate BMI.  Orders:  Orders Placed This Encounter  Procedures  . XR Finger Middle Right   Meds ordered this encounter  Medications  . doxycycline (VIBRA-TABS) 100 MG tablet  Sig: Take 1 tablet (100 mg total) by mouth 2 (two) times daily.    Dispense:  60 tablet    Refill:  0  . mupirocin ointment (BACTROBAN) 2 %    Sig: Apply 1 application topically 2 (two) times daily. Apply to the affected area 2 times a day    Dispense:  22 g    Refill:  3     Procedures: No procedures performed  Clinical Data: No additional findings.  ROS:  All other systems negative, except as noted in the HPI. Review of Systems  Objective: Vital Signs: There were no vitals taken for this visit.  Specialty Comments:  No specialty comments available.  PMFS History: Patient Active Problem List   Diagnosis Date Noted  . Acute midline low back pain without sciatica 09/01/2020  . Microscopic hematuria 07/01/2020  . Pulmonary nodules 07/01/2020  . Hav (hallux abducto valgus),  unspecified laterality 06/11/2020  . Venous ulcer of ankle, left ([place]) 05/15/2020  . Contracture of hand joint, left 05/15/2020  . Cellulitis and abscess of trunk 03/12/2020  . Status post mastectomy, right 01/14/2020  . Acute kidney injury superimposed on CKD ([place])   . Breast cancer, right breast ([place]) 12/23/2019  . Hyperlipidemia   . Carcinoma of lower-outer quadrant of right breast in female, estrogen receptor positive ([place]) 11/12/2019  . Restrictive lung disease 10/08/2019  . Postmenopausal bleeding 08/30/2019  . Carotid stenosis, asymptomatic, right 08/16/2019  . Nausea and vomiting 07/15/2019  . Cellulitis of left lower extremity 06/04/2019  . Multiple wounds of skin 06/04/2019  . Other injury of unspecified body region, initial encounter 06/04/2019  . Left-sided weakness 09/13/2018  . Weakness of left lower extremity 09/05/2018  . Recurrent falls 09/05/2018  . PAD (peripheral artery disease) ([place]) 08/27/2018  . Chronic congestive heart failure ([place]) 08/02/2018  . Vaginal itching 06/15/2018  . Foot pain, bilateral 05/22/2018  . Chronic upper extremity pain (Secondary Area of Pain) (Bilateral) 04/23/2018  . Diabetic polyneuropathy associated with type 2 diabetes mellitus ([place]) 04/23/2018  . Long term prescription benzodiazepine use 04/23/2018  . Neurogenic pain 04/23/2018  . Vitamin D insufficiency 01/29/2018  . Elevated C-reactive protein (CRP) 01/29/2018  . Elevated sedimentation rate 01/29/2018  . Pressure injury of skin 01/29/2018  . Poorly controlled type 2 diabetes mellitus ([place]) 01/23/2018  . DNR (do not resuscitate) 01/23/2018  . CKD stage 3 due to type 2 diabetes mellitus ([name]) 01/23/2018  . IBS (irritable bowel syndrome) 01/19/2018  . Elevated serum hCG 01/19/2018  . Chronic lower extremity pain (Primary Area of Pain) (Bilateral) 01/08/2018  . Chronic low back pain [facility] Area of Pain) (Bilateral) w/ sciatica (Bilateral) 01/08/2018  . Chronic pain syndrome  01/08/2018  . Pharmacologic therapy 01/08/2018  . Disorder of skeletal system 01/08/2018  . Problems influencing health status 01/08/2018  . Long term current use of opiate analgesic 01/08/2018  . Restless leg syndrome 08/02/2017  . Nocturnal hypoxemia 03/29/2017  . Vision disturbance 02/28/2017  . CKD (chronic kidney disease), stage II 02/28/2017  . Visual loss, bilateral   . Chronic respiratory failure with hypoxia (HCC)/ nocturnal 02 dep  10/28/2016  . Upper airway cough syndrome 08/22/2016  . Cigarette smoker 06/15/2016  . Simple chronic bronchitis ([place])   . Coronary artery disease involving native heart without angina pectoris 05/16/2016  . Chronic diastolic CHF (congestive heart failure) ([place]) 11/04/2015  . Orthopnea 11/03/2015  . Bilateral leg edema   . Diabetes ([place])   . COPD exacerbation ([place]) 10/15/2015  .  Fracture of rib, closed 10/15/2015  . Venous stasis of both lower extremities 03/29/2015  . Preventative health care 02/04/2015  . Essential hypertension 01/02/2015  . Inguinal hernia 11/27/2014  . Allergic rhinitis with postnasal drip 01/21/2014  . Aortic valve disorders 04/18/2013  . Sleep apnea 05/22/2012  . Syncope and collapse 10/09/2011  . Seizure disorder ([place]) 10/09/2011  . Bipolar disorder ([place]) 10/09/2011  . TIA (transient ischemic attack) 06/22/2011  . Constipation 06/15/2011  . HYPERTRIGLYCERIDEMIA 07/17/2007  . Situational anxiety 05/30/2007  . End stage COPD ([place]) 05/30/2007  . Morbid (severe) obesity due to excess calories ([place]) 04/23/2007  . Tobacco abuse 09/07/2006   Past Medical History:  Diagnosis Date  . Anxiety    takes Prozac daily  . Anxiety   . Aortic valve calcification   . Asthma    Advair and Spirva daily  . Asthma   . Bipolar disorder ([place])   . CAD (coronary artery disease)    a. LHC 11/2013 done for CP/fluid retention: mild disease in prox LAD, mild-mod disease in mRCA, EF 60% with normal LVEDP. b. Normal nuc 03/2016.  Marland Kitchen Cancer  ([place])   . CHF (congestive heart failure) ([place])   . Chronic diastolic CHF (congestive heart failure) ([place])   . Chronic heart failure with preserved ejection fraction ([place]) 11/16/2018  . CKD (chronic kidney disease), stage II   . COPD (chronic obstructive pulmonary disease) (HCC)    a. nocturnal O2.  Marland Kitchen COPD (chronic obstructive pulmonary disease) ([place])   . Coronary artery disease   . Decreased urine stream   . Diabetes mellitus   . Diabetes mellitus without complication ([place])   . Dyspnea   . Family history of adverse reaction to anesthesia    mom gets nauseated  . GERD (gastroesophageal reflux disease)    takes Pepcid daily  . History of blood clots    left leg 3-46yrs ago  . Hyperlipidemia   . Hypertension   . Hypertriglyceridemia   . Inguinal hernia, left 01/2015  . Muscle spasm   . Open wound of genital labia   . Peripheral neuropathy   . RBBB   . Seizures ([place])   . Sepsis ([place]) 01/19/2018  . Sinus tachycardia    a. persistent since 2009.  Marland Kitchen Smokers' cough ([place])   . Stroke [facility]) 1989   left sided weakness  . TIA (transient ischemic attack)   . Tobacco abuse   . Vulvar abscess 01/23/2018    Family History  Problem Relation Age of Onset  . Venous thrombosis Brother   . Other Brother        BRAIN TUMOR  . Asthma Father   . Diabetes Father   . Coronary artery disease Mother   . Hypertension Mother   . Diabetes Mother   . Breast cancer Mother 54  . Asthma Sister   . Diabetes type II Brother     Past Surgical History:  Procedure Laterality Date  . APPLICATION OF WOUND VAC Right 01/29/2020   Procedure: APPLICATION OF WOUND VAC;  Surgeon: Ronny Bacon, MD;  Location: [facility];  Service: General;  Laterality: Right;  . BREAST BIOPSY Right 11/06/2019   [place] core path pending venus clip  . HERNIA REPAIR Left   . INCISION AND DRAINAGE ABSCESS N/A 01/26/2018   Procedure: INCISION AND DEBRIDEMENT OF VULVAR NECROTIZING SOFT TISSUE INFECTION;  Surgeon: Greer Pickerel, MD;   Location: [facility];  Service: General;  Laterality: N/A;  . INCISION AND DRAINAGE PERIRECTAL ABSCESS N/A  01/22/2018   Procedure: IRRIGATION AND DEBRIDEMENT LABIAL/VULVAR AREA;  Surgeon: Coralie Keens, MD;  Location: [facility];  Service: General;  Laterality: N/A;  . INCISION AND DRAINAGE PERIRECTAL ABSCESS N/A 01/29/2018   Procedure: IRRIGATION AND DEBRIDEMENT VULVA;  Surgeon: Excell Seltzer, MD;  Location: [facility];  Service: General;  Laterality: N/A;  . INGUINAL HERNIA REPAIR Left 04/08/2015   Procedure: OPEN LEFT INGUINAL HERNIA REPAIR WITH MESH;  Surgeon: Ralene Ok, MD;  Location: [facility];  Service: General;  Laterality: Left;  . INSERTION OF MESH Left 04/08/2015   Procedure: INSERTION OF MESH;  Surgeon: Ralene Ok, MD;  Location: [facility];  Service: General;  Laterality: Left;  . LAPAROSCOPY     Endometriosis  . LEFT HEART CATHETERIZATION WITH CORONARY ANGIOGRAM N/A 12/05/2013   Procedure: LEFT HEART CATHETERIZATION WITH CORONARY ANGIOGRAM;  Surgeon: Jettie Booze, MD;  Location: [facility] LAB;  Service: Cardiovascular;  Laterality: N/A;  . right kidney drained    . SIMPLE MASTECTOMY WITH AXILLARY SENTINEL NODE BIOPSY Right 12/23/2019   Procedure: Right SIMPLE MASTECTOMY WITH AXILLARY SENTINEL NODE BIOPSY;  Surgeon: Ronny Bacon, MD;  Location: [facility];  Service: General;  Laterality: Right;  . TEE WITHOUT CARDIOVERSION N/A 11/28/2013   Procedure: TRANSESOPHAGEAL ECHOCARDIOGRAM (TEE);  Surgeon: Thayer Headings, MD;  Location: [facility] [place];  Service: Cardiovascular;  Laterality: N/A;  . WOUND DEBRIDEMENT Right 01/29/2020   Procedure: DEBRIDEMENT WOUND, Excisional debridement skin, subcutaneous and muscle right chest wall;  Surgeon: Ronny Bacon, MD;  Location: [facility];  Service: General;  Laterality: Right;   Social History   Occupational History  . Not on file  Tobacco Use  . Smoking status: Current Some Day Smoker    Packs/day: 0.25    Years: 36.00    Pack years: 9.00     Types: Cigarettes    Start date: 07/11/1981  . Smokeless tobacco: Never Used  . Tobacco comment: 1 ppd now  Vaping Use  . Vaping Use: Some days  Substance and Sexual Activity  . Alcohol use: No  . Drug use: No  . Sexual activity: Not Currently

## 2020-09-30 ENCOUNTER — Encounter: Payer: Self-pay | Admitting: Family

## 2020-10-01 ENCOUNTER — Encounter: Payer: Self-pay | Admitting: Primary Care

## 2020-10-01 ENCOUNTER — Other Ambulatory Visit: Payer: Self-pay

## 2020-10-01 ENCOUNTER — Ambulatory Visit (INDEPENDENT_AMBULATORY_CARE_PROVIDER_SITE_OTHER): Payer: BC Managed Care – PPO | Admitting: Primary Care

## 2020-10-01 VITALS — BP 124/72 | HR 80 | Temp 97.3°F | Wt 220.5 lb

## 2020-10-01 DIAGNOSIS — E1165 Type 2 diabetes mellitus with hyperglycemia: Secondary | ICD-10-CM | POA: Diagnosis not present

## 2020-10-01 DIAGNOSIS — R42 Dizziness and giddiness: Secondary | ICD-10-CM | POA: Insufficient documentation

## 2020-10-01 DIAGNOSIS — I10 Essential (primary) hypertension: Secondary | ICD-10-CM | POA: Diagnosis not present

## 2020-10-01 DIAGNOSIS — G43709 Chronic migraine without aura, not intractable, without status migrainosus: Secondary | ICD-10-CM | POA: Diagnosis not present

## 2020-10-01 LAB — BASIC METABOLIC PANEL
BUN: 27 mg/dL — ABNORMAL HIGH (ref 6–23)
CO2: 29 mEq/L (ref 19–32)
Calcium: 9.6 mg/dL (ref 8.4–10.5)
Chloride: 97 mEq/L (ref 96–112)
Creatinine, Ser: 1.09 mg/dL (ref 0.40–1.20)
GFR: 58.74 mL/min — ABNORMAL LOW (ref >60.00)
Glucose, Bld: 397 mg/dL — ABNORMAL HIGH (ref 70–99)
Potassium: 4.9 mEq/L (ref 3.5–5.1)
Sodium: 133 mEq/L — ABNORMAL LOW (ref 135–145)

## 2020-10-01 LAB — CBC
HCT: 40.6 % (ref 36.0–46.0)
Hemoglobin: 13.4 g/dL (ref 12.0–15.0)
MCHC: 33 g/dL (ref 30.0–36.0)
MCV: 84.4 fl (ref 78.0–100.0)
Platelets: 171 10*3/uL (ref 150.0–400.0)
RBC: 4.81 Mil/uL (ref 3.87–5.11)
RDW: 18.6 % — ABNORMAL HIGH (ref 11.5–15.5)
WBC: 8.6 10*3/uL (ref 4.0–10.5)

## 2020-10-01 LAB — GLUCOSE, POCT (MANUAL RESULT ENTRY): POC Glucose: 342 mg/dl — AB (ref 70–99)

## 2020-10-01 LAB — TSH: TSH: 0.66 u[IU]/mL (ref 0.35–4.50)

## 2020-10-01 MED ORDER — MECLIZINE HCL 12.5 MG PO TABS: 12.5000 mg | ORAL_TABLET | Freq: Two times a day (BID) | ORAL | 0 refills | Status: DC | PRN

## 2020-10-01 NOTE — Assessment & Plan Note (Signed)
Chronic for years, never treated.  Given her extensive medical history coupled with the numerous medications she takes, will send to neurology for an alternative treatment.   Referral placed.

## 2020-10-01 NOTE — Patient Instructions (Signed)
Stop by the lab prior to leaving today. I will notify you of your results once received.   You can try taking Meclizine twice daily ONLY IF NEEDED for dizziness.  You will be contacted regarding your referral to neurology for migraines.  Please let us know if you have not been contacted within two weeks.   It was a pleasure to see you today!

## 2020-10-01 NOTE — Assessment & Plan Note (Addendum)
Chronic for 2-3 months, seems like vertigo but given her extensive medical history it's difficult to discern.   She doesn't appear in any distress.  Vitals stable. Checking glucose. Checking labs today to rule out other causes.  Neuro exam stable.  Rx for Meclizine 12.5 mg sent to pharmacy, discussed to use PRN. Encouraged walker and cane use.   She will update.

## 2020-10-01 NOTE — Addendum Note (Signed)
Addended by: Francella Solian on: 10/01/2020 11:09 AM   Modules accepted: Orders

## 2020-10-01 NOTE — [place] Notes (Signed)
Subjective:    Patient ID: [name], female    DOB: 06-24-1971, 50 y.o.   MRN: [id number]  HPI  [name] is a very pleasant 50 y.o. female with a significant medical history including uncontrolled type 2 diabetes, COPD, breast cancer, PAD, skin ulceration, tobacco abuse, neuropathy, CHF, TIA,  who presents today with a chief complaint of dizziness.  Acute for the last 2-3 months, intermittent occurring with resting, walking, feels like "everything is moving around me". She was driving several days ago and felt the "road moving away from me". She's fallen several times because of the dizziness, scrapped up her arms, has hit her head several times to the occipital and sides.   She has chronic headaches/migraines since her breast surgery one year ago, located to the parietal region of her head, occurring daily. She experiences photophobia and phonophobia ,has to go into a dark room with improvement. She takes Excedrin Migraine every other day with some improvement.   She denies ear pain, changes in chronic cough, fevers, chest pain, speech changes, blurred vision, nausea, loss of consciousness. She has noticed post nasal drip. She denies symptoms laying down. She recently began using her cane again.    BP Readings from Last 3 Encounters:  10/01/20 124/72  09/01/20 132/74  08/05/20 (!) 120/58      Review of Systems  Eyes: Negative for visual disturbance.  Cardiovascular: Negative for chest pain.  Neurological: Positive for dizziness and headaches.       See HPI         Past Medical History:  Diagnosis Date  . Anxiety    takes Prozac daily  . Anxiety   . Aortic valve calcification   . Asthma    Advair and Spirva daily  . Asthma   . Bipolar disorder ([place])   . CAD (coronary artery disease)    a. LHC 11/2013 done for CP/fluid retention: mild disease in prox LAD, mild-mod disease in mRCA, EF 60% with normal LVEDP. b. Normal nuc 03/2016.  Marland Kitchen Cancer ([place])   . CHF  (congestive heart failure) ([place])   . Chronic diastolic CHF (congestive heart failure) ([place])   . Chronic heart failure with preserved ejection fraction ([place]) 11/16/2018  . CKD (chronic kidney disease), stage II   . COPD (chronic obstructive pulmonary disease) (H[facility])    a. nocturnal O2.  Marland Kitchen COPD (chronic obstructive pulmonary disease) ([place])   . Coronary artery disease   . Decreased urine stream   . Diabetes mellitus   . Diabetes mellitus without complication ([place])   . Dyspnea   . Family history of adverse reaction to anesthesia    mom gets nauseated  . GERD (gastroesophageal reflux disease)    takes Pepcid daily  . History of blood clots    left leg 3-76yrs ago  . Hyperlipidemia   . Hypertension   . Hypertriglyceridemia   . Inguinal hernia, left 01/2015  . Muscle spasm   . Open wound of genital labia   . Peripheral neuropathy   . RBBB   . Seizures ([place])   . Sepsis ([place]) 01/19/2018  . Sinus tachycardia    a. persistent since 2009.  Marland Kitchen Smokers' cough ([place])   . Stroke [facility]) 1989   left sided weakness  . TIA (transient ischemic attack)   . Tobacco abuse   . Vulvar abscess 01/23/2018    Social History   Socioeconomic History  . Marital status: Married    Spouse name: Not on file  .  Number of children: 2  . Years of education: Not on file  . Highest education level: Not on file  Occupational History  . Not on file  Tobacco Use  . Smoking status: Current Some Day Smoker    Packs/day: 0.25    Years: 36.00    Pack years: 9.00    Types: Cigarettes    Start date: 07/11/1981  . Smokeless tobacco: Never Used  . Tobacco comment: 1 ppd now  Vaping Use  . Vaping Use: Some days  Substance and Sexual Activity  . Alcohol use: No  . Drug use: No  . Sexual activity: Not Currently  Other Topics Concern  . Not on file  Social History Narrative   ** Merged History Encounter **       Lives in [place]   Married but lives with Boyfriend - not legally separated   Disabled - for  BiPolar, Seizure disorder, diabetes   Formerly worked at [employer] 1 ppd; no alcohol. 2 step sons; no biologic children.       Social Determinants of Health   Financial Resource Strain: Not on file  Food Insecurity: Not on file  Transportation Needs: Not on file  Physical Activity: Not on file  Stress: Not on file  Social Connections: Not on file  Intimate Partner Violence: Not on file    Past Surgical History:  Procedure Laterality Date  . APPLICATION OF WOUND VAC Right 01/29/2020   Procedure: APPLICATION OF WOUND VAC;  Surgeon: Ronny Bacon, MD;  Location: [facility];  Service: General;  Laterality: Right;  . BREAST BIOPSY Right 11/06/2019   [place] core path pending venus clip  . HERNIA REPAIR Left   . INCISION AND DRAINAGE ABSCESS N/A 01/26/2018   Procedure: INCISION AND DEBRIDEMENT OF VULVAR NECROTIZING SOFT TISSUE INFECTION;  Surgeon: Greer Pickerel, MD;  Location: [place];  Service: General;  Laterality: N/A;  . INCISION AND DRAINAGE PERIRECTAL ABSCESS N/A 01/22/2018   Procedure: IRRIGATION AND DEBRIDEMENT LABIAL/VULVAR AREA;  Surgeon: Coralie Keens, MD;  Location: [place];  Service: General;  Laterality: N/A;  . INCISION AND DRAINAGE PERIRECTAL ABSCESS N/A 01/29/2018   Procedure: IRRIGATION AND DEBRIDEMENT VULVA;  Surgeon: Excell Seltzer, MD;  Location: [place];  Service: General;  Laterality: N/A;  . INGUINAL HERNIA REPAIR Left 04/08/2015   Procedure: OPEN LEFT INGUINAL HERNIA REPAIR WITH MESH;  Surgeon: Ralene Ok, MD;  Location: [place];  Service: General;  Laterality: Left;  . INSERTION OF MESH Left 04/08/2015   Procedure: INSERTION OF MESH;  Surgeon: Ralene Ok, MD;  Location: [place];  Service: General;  Laterality: Left;  . LAPAROSCOPY     Endometriosis  . LEFT HEART CATHETERIZATION WITH CORONARY ANGIOGRAM N/A 12/05/2013   Procedure: LEFT HEART CATHETERIZATION WITH CORONARY ANGIOGRAM;  Surgeon: Jettie Booze, MD;  Location[facility] C[facility];  Service:  Cardiovascular;  Laterality: N/A;  . right kidney drained    . SIMPLE MASTECTOMY WITH AXILLARY SENTINEL NODE BIOPSY Right 12/23/2019   Procedure: Right SIMPLE MASTECTOMY WITH AXILLARY SENTINEL NODE BIOPSY;  Surgeon: Ronny Bacon, MD;  Location: [facility];  Service: General;  Laterality: Right;  . TEE WITHOUT CARDIOVERSION N/A 11/28/2013   Procedure: TRANSESOPHAGEAL ECHOCARDIOGRAM (TEE);  Surgeon: Thayer Headings, MD;  Location: [place];  Service: Cardiovascular;  Laterality: N/A;  . WOUND DEBRIDEMENT Right 01/29/2020   Procedure: DEBRIDEMENT WOUND, Excisional debridement skin, subcutaneous and muscle right chest wall;  Surgeon: Ronny Bacon, MD;  Location: [facility];  Service: General;  Laterality: Right;    Family History  Problem Relation Age of Onset  . Venous thrombosis Brother   . Other Brother        BRAIN TUMOR  . Asthma Father   . Diabetes Father   . Coronary artery disease Mother   . Hypertension Mother   . Diabetes Mother   . Breast cancer Mother 5  . Asthma Sister   . Diabetes type II Brother   . Diabetes Brother     Allergies  Allergen Reactions  . Adhesive [Tape] Rash and Other (See Comments)    TAKES OFF THE SKIN (CERTAIN MEDICAL TAPES DO THIS!!)  . Metoprolol Shortness Of Breath    Occurrence of shortness of breath after 3 days  . Montelukast Shortness Of Breath  . Morphine Sulfate Anaphylaxis, Shortness Of Breath and Nausea And Vomiting    Swollen Throat - Able to tolerate dilaudid  . Penicillins Anaphylaxis, Hives and Shortness Of Breath    Throat swells Has patient had a PCN reaction causing immediate rash, facial/tongue/throat swelling, SOB or lightheadedness with hypotension: Yes Has patient had a PCN reaction causing severe rash involving mucus membranes or skin necrosis: No Has patient had a PCN reaction that required hospitalization: Yes Has patient had a PCN reaction occurring within the last 10 years: No If all of the above answers are "NO",  then may proceed with Cephalosporin use.   . Diltiazem Swelling  . Gabapentin Swelling  . Midodrine     Lightheaded and falling down    Current Outpatient Medications on File Prior to Visit  Medication Sig Dispense Refill  . acetaminophen (TYLENOL) 500 MG tablet Take 1,000 mg by mouth every 6 (six) hours as needed for moderate pain.     Marland Kitchen albuterol (VENTOLIN HFA) 108 (90 Base) MCG/ACT inhaler Inhale 2 puffs into the lungs every 6 (six) hours as needed for wheezing or shortness of breath.    . ALPRAZolam (XANAX) 1 MG tablet Take 1 mg by mouth 4 (four) times daily as needed for anxiety.     Marland Kitchen anastrozole (ARIMIDEX) 1 MG tablet Take 1 tablet (1 mg total) by mouth daily. (Patient taking differently: Take 1 mg by mouth daily. Will start on 12/27) 60 tablet 2  . aspirin EC 81 MG tablet Take 81 mg by mouth daily before breakfast.     . benzonatate (TESSALON) 200 MG capsule Take 1 capsule (200 mg total) by mouth 3 (three) times daily as needed for cough. 15 capsule 0  . budesonide-formoterol (SYMBICORT) 80-4.5 MCG/ACT inhaler INHALE 2 PUFFS BY MOUTH EVERY 12 HOURS TO PREVENT COUGH OR WHEEZING *RINSE MOUTH AFTER EACH USE* (Patient taking differently: Inhale 2 puffs into the lungs in the morning and at bedtime.) 10.2 g 3  . busPIRone (BUSPAR) 30 MG tablet Take 30 mg by mouth 2 (two) times daily.    . butalbital-acetaminophen-caffeine (FIORICET) 50-325-40 MG tablet Take 1-2 tablets by mouth every 6 (six) hours as needed for headache. 20 tablet 0  . citalopram (CELEXA) 20 MG tablet Take 1 tablet (20 mg total) by mouth daily. 30 tablet 2  . cyclobenzaprine (FLEXERIL) 10 MG tablet Take 1 tablet (10 mg total) by mouth 2 (two) times daily as needed for muscle spasms. 14 tablet 0  . Dapagliflozin-metFORMIN HCl ER 5-500 MG TB24 Take 1 tablet by mouth daily with breakfast.    . desvenlafaxine (PRISTIQ) 100 MG 24 hr tablet Take 100 mg by mouth daily.    . diclofenac  Sodium (VOLTAREN) 1 % GEL Apply 2 g topically 3  (three) times daily as needed. For pain. 100 g 0  . doxycycline (VIBRA-TABS) 100 MG tablet Take 1 tablet (100 mg total) by mouth 2 (two) times daily. 60 tablet 0  . famotidine (PEPCID) 20 MG tablet TAKE 1 TABLET (20 MG TOTAL) BY MOUTH 2 (TWO) TIMES DAILY. FOR HEARTBURN. (Patient taking differently: Take 20 mg by mouth 2 (two) times daily.) 180 tablet 1  . Insulin Pen Needle 32G X 4 MM MISC Used to give insulin injections twice daily. 100 each 11  . insulin regular human CONCENTRATED (HUMULIN R) 500 UNIT/ML injection Inject 30 Units into the skin 3 (three) times daily with meals.     . Insulin Syringe-Needle U-100 (GLOBAL INJECT EASE INSULIN SYR) 30G X 1/2" 1 ML MISC See admin instructions.    Marland Kitchen ipratropium-albuterol (DUONEB) 0.5-2.5 (3) MG/3ML SOLN Take 3 mLs by nebulization every 4 (four) hours as needed (wheezing/shortness of breath). 360 mL 0  . metolazone (ZAROXOLYN) 5 MG tablet Take 5 mg by mouth daily.     . mupirocin ointment (BACTROBAN) 2 % Apply 1 application topically 2 (two) times daily. Apply to the affected area 2 times a day 22 g 3  . nystatin-triamcinolone ointment (MYCOLOG) Apply 1 application topically 2 (two) times daily. 30 g 1  . OneTouch Delica Lancets 85Y MISC 1 each by Does not apply route 3 (three) times daily. Use to monitor glucose levels three times daily; E11.9; E10.42 100 each 11  . ONETOUCH VERIO test strip USE AS DIRECTED TO CHECK BLOOD SUGAR 3 TIMES DAILY 100 each 0  . OXYGEN Inhale 2-4 L into the lungs 2 (two) times daily as needed (shortness of breath).     . pregabalin (LYRICA) 300 MG capsule Take 1 capsule (300 mg total) by mouth 2 (two) times daily. For nerve pain. 180 capsule 0  . promethazine-dextromethorphan (PROMETHAZINE-DM) 6.25-15 MG/5ML syrup Take 5 mLs by mouth 2 (two) times daily as needed for cough. May cause drowsiness. 100 mL 0  . rOPINIRole (REQUIP) 1 MG tablet TAKE 1 TABLET BY MOUTH EVERY NIGHT AT BEDTIME FOR RESTLESS LEGS. 90 tablet 1  .  rosuvastatin (CRESTOR) 20 MG tablet Take 1 tablet (20 mg total) by mouth daily. For cholesterol. 90 tablet 3  . spironolactone (ALDACTONE) 50 MG tablet TAKE 1 TABLET BY MOUTH ONCE A DAY 90 tablet 1  . sulfamethoxazole-trimethoprim (BACTRIM DS) 800-160 MG tablet Take 1 tablet by mouth 2 (two) times daily. 20 tablet 0  . tamoxifen (NOLVADEX) 20 MG tablet Take 1 tablet (20 mg total) by mouth daily. 90 tablet 1  . torsemide (DEMADEX) 20 MG tablet TAKE 1 TABLET BY MOUTH 4 TIMES DAILY (Patient taking differently: Take 20 mg by mouth 2 (two) times daily.) 360 tablet 3  . traZODone (DESYREL) 100 MG tablet Take 200 mg by mouth at bedtime.     Marland Kitchen ULTICARE MINI PEN NEEDLES 31G X 6 MM MISC USE AS DIRECTED 3 TIMES DAILY 100 each 0   No current facility-administered medications on file prior to visit.    BP 124/72   Pulse 80   Temp (!) 97.3 F (36.3 C) (Temporal)   Wt 220 lb 8 oz (100 kg)   SpO2 95%   BMI 37.85 kg/m  Objective:   Physical Exam HENT:     Right Ear: Tympanic membrane and ear canal normal.     Left Ear: Tympanic membrane and ear canal normal.  Cardiovascular:  Rate and Rhythm: Normal rate and regular rhythm.  Pulmonary:     Effort: Pulmonary effort is normal.     Breath sounds: No wheezing.  Skin:    General: Skin is warm and dry.     Comments: Scabbing noted to bilateral forearms   Neurological:     Mental Status: She is alert and oriented to person, place, and time.     Cranial Nerves: No cranial nerve deficit.     Coordination: Coordination normal.     Comments: Patient dizzy while standing in exam room, had to lean against exam table           Assessment & Plan:      This visit occurred during the SARS-CoV-2 public health emergency.  Safety protocols were in place, including screening questions prior to the visit, additional usage of staff PPE, and extensive cleaning of exam room while observing appropriate contact time as indicated for disinfecting solutions.

## 2020-10-02 ENCOUNTER — Telehealth: Payer: Self-pay

## 2020-10-02 NOTE — Telephone Encounter (Signed)
I called and lm on vm to advise that we saw the pt in the office 09/29/20. Had changed po abx from bactrim to doxy bid and for her to use bactroban with a dressing to the right middle finger three times daily and she has follow up in the office 10/20/20. To call with any other questions.

## 2020-10-02 NOTE — Telephone Encounter (Signed)
[name] from [facility] Dr.Duda was going to send a referral for patients finger she is asking where the referral was sent and she wants to know if patient needs wound care plan  patient told [name] she uses bandages and Germs Ex and cream Dr.Duda provided for patient call back:[phone]

## 2020-10-08 ENCOUNTER — Telehealth: Payer: Self-pay | Admitting: Orthopedic Surgery

## 2020-10-08 ENCOUNTER — Telehealth: Payer: Self-pay

## 2020-10-08 NOTE — Telephone Encounter (Signed)
[name] from [place] called and states pt stopped taking antibiotics and was wondering if someone from here told her to stop. Please call and tell the pt if she is suppose to be taking it. [phone]

## 2020-10-08 NOTE — Telephone Encounter (Signed)
Anna nurse with [facility] left v/m that she just spoke with pt and pt said that [facility] told pt to stop abx that Dr Sharol Given started pt on for wound on rt middle finger. [name] wanted to call to verify that [facility] did advise pt to stop abx or was there miscommunication so [name] can let Dr Sharol Given know if pt is not taking abx and why. Sending note to [facility] CMA.

## 2020-10-08 NOTE — Telephone Encounter (Signed)
Called left detailed message for [name]. Have reviewed all patient notes do not see any documentation where we have advised to d/c antibiotics. Informed to call back to office if any questions.

## 2020-10-09 NOTE — Telephone Encounter (Signed)
I called and lm on vm for the pt to call me back. She was given rx of Doxy at visit 09/29/20 and was to be taking this twice a day. Called to see if she was still taking this and if we can move her appt up from 10/20/20 to next week.

## 2020-10-13 ENCOUNTER — Telehealth: Payer: Self-pay

## 2020-10-13 DIAGNOSIS — M545 Low back pain, unspecified: Secondary | ICD-10-CM

## 2020-10-13 DIAGNOSIS — G8929 Other chronic pain: Secondary | ICD-10-CM

## 2020-10-13 NOTE — Telephone Encounter (Signed)
[name] from [facility] called and stated that she just saw the patient who is requesting an order for a bariatric BSC. Please advise.

## 2020-10-14 ENCOUNTER — Other Ambulatory Visit: Payer: Self-pay

## 2020-10-14 ENCOUNTER — Encounter: Payer: Self-pay | Admitting: Internal Medicine

## 2020-10-14 ENCOUNTER — Inpatient Hospital Stay: Payer: BC Managed Care – PPO | Attending: Internal Medicine

## 2020-10-14 ENCOUNTER — Inpatient Hospital Stay (HOSPITAL_BASED_OUTPATIENT_CLINIC_OR_DEPARTMENT_OTHER): Payer: BC Managed Care – PPO | Admitting: Internal Medicine

## 2020-10-14 VITALS — BP 141/74 | HR 78 | Temp 97.6°F | Resp 20 | Ht 64.0 in | Wt 251.0 lb

## 2020-10-14 DIAGNOSIS — N183 Chronic kidney disease, stage 3 unspecified: Secondary | ICD-10-CM | POA: Insufficient documentation

## 2020-10-14 DIAGNOSIS — C50511 Malignant neoplasm of lower-outer quadrant of right female breast: Secondary | ICD-10-CM | POA: Diagnosis not present

## 2020-10-14 DIAGNOSIS — N63 Unspecified lump in unspecified breast: Secondary | ICD-10-CM | POA: Diagnosis not present

## 2020-10-14 DIAGNOSIS — G43909 Migraine, unspecified, not intractable, without status migrainosus: Secondary | ICD-10-CM | POA: Insufficient documentation

## 2020-10-14 DIAGNOSIS — J441 Chronic obstructive pulmonary disease with (acute) exacerbation: Secondary | ICD-10-CM | POA: Diagnosis not present

## 2020-10-14 DIAGNOSIS — Z79811 Long term (current) use of aromatase inhibitors: Secondary | ICD-10-CM | POA: Insufficient documentation

## 2020-10-14 DIAGNOSIS — Z794 Long term (current) use of insulin: Secondary | ICD-10-CM | POA: Insufficient documentation

## 2020-10-14 DIAGNOSIS — E1142 Type 2 diabetes mellitus with diabetic polyneuropathy: Secondary | ICD-10-CM | POA: Insufficient documentation

## 2020-10-14 DIAGNOSIS — F17210 Nicotine dependence, cigarettes, uncomplicated: Secondary | ICD-10-CM | POA: Diagnosis not present

## 2020-10-14 DIAGNOSIS — Z170 Estrogen receptor positive status [ER+]: Secondary | ICD-10-CM

## 2020-10-14 DIAGNOSIS — E1122 Type 2 diabetes mellitus with diabetic chronic kidney disease: Secondary | ICD-10-CM | POA: Insufficient documentation

## 2020-10-14 DIAGNOSIS — E1165 Type 2 diabetes mellitus with hyperglycemia: Secondary | ICD-10-CM | POA: Insufficient documentation

## 2020-10-14 DIAGNOSIS — N6320 Unspecified lump in the left breast, unspecified quadrant: Secondary | ICD-10-CM | POA: Insufficient documentation

## 2020-10-14 DIAGNOSIS — F319 Bipolar disorder, unspecified: Secondary | ICD-10-CM | POA: Insufficient documentation

## 2020-10-14 DIAGNOSIS — I509 Heart failure, unspecified: Secondary | ICD-10-CM | POA: Insufficient documentation

## 2020-10-14 LAB — COMPREHENSIVE METABOLIC PANEL
ALT: 19 U/L (ref 0–44)
AST: 20 U/L (ref 15–41)
Albumin: 3.9 g/dL (ref 3.5–5.0)
Alkaline Phosphatase: 144 U/L — ABNORMAL HIGH (ref 38–126)
Anion gap: 10 (ref 5–15)
BUN: 25 mg/dL — ABNORMAL HIGH (ref 6–20)
CO2: 28 mmol/L (ref 22–32)
Calcium: 9.2 mg/dL (ref 8.9–10.3)
Chloride: 97 mmol/L — ABNORMAL LOW (ref 98–111)
Creatinine, Ser: 1.04 mg/dL — ABNORMAL HIGH (ref 0.44–1.00)
GFR, Estimated: >60 mL/min (ref >60)
Glucose, Bld: 343 mg/dL — ABNORMAL HIGH (ref 70–99)
Potassium: 4 mmol/L (ref 3.5–5.1)
Sodium: 135 mmol/L (ref 135–145)
Total Bilirubin: 0.7 mg/dL (ref 0.3–1.2)
Total Protein: 9 g/dL — ABNORMAL HIGH (ref 6.5–8.1)

## 2020-10-14 LAB — CBC WITH DIFFERENTIAL/PLATELET
Abs Immature Granulocytes: 0.06 10*3/uL (ref 0.00–0.07)
Basophils Absolute: 0.1 10*3/uL (ref 0.0–0.1)
Basophils Relative: 1 %
Eosinophils Absolute: 0.3 10*3/uL (ref 0.0–0.5)
Eosinophils Relative: 3 %
HCT: 42.8 % (ref 36.0–46.0)
Hemoglobin: 13.9 g/dL (ref 12.0–15.0)
Immature Granulocytes: 1 %
Lymphocytes Relative: 15 %
Lymphs Abs: 1.3 10*3/uL (ref 0.7–4.0)
MCH: 27.9 pg (ref 26.0–34.0)
MCHC: 32.5 g/dL (ref 30.0–36.0)
MCV: 85.8 fL (ref 80.0–100.0)
Monocytes Absolute: 0.5 10*3/uL (ref 0.1–1.0)
Monocytes Relative: 5 %
Neutro Abs: 6.4 10*3/uL (ref 1.7–7.7)
Neutrophils Relative %: 75 %
Platelets: 214 10*3/uL (ref 150–400)
RBC: 4.99 MIL/uL (ref 3.87–5.11)
RDW: 16.8 % — ABNORMAL HIGH (ref 11.5–15.5)
WBC: 8.5 10*3/uL (ref 4.0–10.5)
nRBC: 0 % (ref 0.0–0.2)

## 2020-10-14 NOTE — Assessment & Plan Note (Addendum)
#   T2N0- ER/PR-positive; premenopausal currently on Anastrazole [stopped tam sec to poor tolerance].  Previously Lupron declined by insurance.  Patient noncompliant with appointments with gynecology for oophorectomy.  Given the change/new insurance-we will appeal for Lupron again.  #Continue anastrozole for now.  Await Lupron approval/if not good recommend oophorectomy.  # left breast- mass near nipple- recommend Diagnostic mammo/US ASAP  # Migraines- awaiting neurology evaluation   #Depression/anxiety/bipolar-currently on BuSpar/Celexa/trazodone-stable-As per psychiatry.  #Chronic respiratory failure-CHF/COPD-STABLE.   #Poorly controlled diabetes-on insulin; AM- 343;  followed endocrinology.  #Peripheral neuropathy grade 2-3; patient goes on with a walker/cane at baseline- STABLE.    #[facility]-recommend give phone number-discussed patient's care.  DISPOSITION: # ASAP- left Diagnostic mammo/US.  # follow up TBD-Dr. Jacinto Reap

## 2020-10-14 NOTE — Progress Notes (Signed)
[place] NOTE  Patient Care Team: Pleas Koch, NP as PCP - General (Internal Medicine) Rosamaria Lints, MD (Inactive) as Referring Physician (Neurology) Alvester Chou, NP (Nurse Practitioner) Renato Shin, MD as Consulting Physician (Endocrinology) Theodore Demark, RN as Oncology Nurse Navigator Ronny Bacon, MD as Consulting Physician (General Surgery) [facility] as Registered Nurse Flora Lipps, MD as Consulting Physician (Pulmonary Disease)  CHIEF COMPLAINTS/PURPOSE OF CONSULTATION: Breast cancer.  #  Oncology History Overview Note  #April 2021-2.5 cm right breast mass [incidental-CT scan chest]  # April 2021- RIGHT BREAST [facility]; s/p ER- 51-90%; PR- 51-90%; Her- 2-NEG. G-2. [Dr.Rodenberg]; s/p  Masectomy- pT2 pN0 (sn); Tamoxifen; Poor candidate for chemo;   # Discontinue tamoxifen early DEC 2021 [sec to nausea/vomiting diarrhea]  #Late December 2021- Anastrazole + eligard  # DM- poorly controlled; Seizure disorder/Bipolar/TIA ; Diabetes PN- G-3 [cane/walker; falls]; COPD; OSA active smoker  # SURVIVORSHIP:   # GENETICS:   DIAGNOSIS: Right breast cancer  STAGE:    II     ;  GOALS: goal  CURRENT/MOST RECENT THERAPY : Tam     Carcinoma of lower-outer quadrant of right breast in female, estrogen receptor positive ([place])  11/12/2019 Initial Diagnosis   Carcinoma of lower-outer quadrant of right breast in female, estrogen receptor positive ([place])      HISTORY OF PRESENTING ILLNESS:  [name] 50 y.o.  female premenopausal patient with right-sided stage II ER/PR positive HER-2 negative breast cancer with multiple comorbidities-including poorly controlled diabetes/bipolar disorder/active smoker/neuropathy is here for follow-up.  Patient continues to have intermittent headaches which she attributes to her history of migraines.  The chronic headaches.  Awaiting to see neurology later in the month.  Patient is concerned  about a new lump in the left breast near the nipple.  Complains of tenderness.  Admits noticing the lump approximately 1 week ago.  Patient continues to have chronic neuropathy.  Chronic joint pains back pain.   Review of Systems  Constitutional: Positive for malaise/fatigue. Negative for chills, diaphoresis, fever and weight loss.  HENT: Negative for nosebleeds and sore throat.   Eyes: Negative for double vision.  Respiratory: Negative for cough, hemoptysis, sputum production, shortness of breath and wheezing.   Cardiovascular: Negative for chest pain, palpitations, orthopnea and leg swelling.  Gastrointestinal: Negative for abdominal pain, blood in stool, constipation, diarrhea, heartburn, melena, nausea and vomiting.  Genitourinary: Negative for dysuria, frequency and urgency.  Musculoskeletal: Positive for back pain and joint pain.  Skin: Positive for rash. Negative for itching.  Neurological: Negative for dizziness, tingling, focal weakness, weakness and headaches.  Endo/Heme/Allergies: Does not bruise/bleed easily.  Psychiatric/Behavioral: Negative for depression. The patient is not nervous/anxious and does not have insomnia.      MEDICAL HISTORY:  Past Medical History:  Diagnosis Date  . Anxiety    takes Prozac daily  . Anxiety   . Aortic valve calcification   . Asthma    Advair and Spirva daily  . Asthma   . Bipolar disorder ([place])   . CAD (coronary artery disease)    a. LHC 11/2013 done for CP/fluid retention: mild disease in prox LAD, mild-mod disease in mRCA, EF 60% with normal LVEDP. b. Normal nuc 03/2016.  Marland Kitchen Cancer ([place])   . CHF (congestive heart failure) ([place])   . Chronic diastolic CHF (congestive heart failure) ([place])   . Chronic heart failure with preserved ejection fraction ([place]) 11/16/2018  . CKD (chronic kidney disease), stage II   .  COPD (chronic obstructive pulmonary disease) ([facility])    a. nocturnal O2.  Marland Kitchen COPD (chronic obstructive pulmonary disease) ([place])   .  Coronary artery disease   . Decreased urine stream   . Diabetes mellitus   . Diabetes mellitus without complication ([place])   . Dyspnea   . Family history of adverse reaction to anesthesia    mom gets nauseated  . GERD (gastroesophageal reflux disease)    takes Pepcid daily  . History of blood clots    left leg 3-55yr ago  . Hyperlipidemia   . Hypertension   . Hypertriglyceridemia   . Inguinal hernia, left 01/2015  . Muscle spasm   . Open wound of genital labia   . Peripheral neuropathy   . RBBB   . Seizures ([place]   . Sepsis ([place] 01/19/2018  . Sinus tachycardia    a. persistent since 2009.  .Marland KitchenSmokers' cough ([place]   . Stroke ([facility] 1989   left sided weakness  . TIA (transient ischemic attack)   . Tobacco abuse   . Vulvar abscess 01/23/2018    SURGICAL HISTORY: Past Surgical History:  Procedure Laterality Date  . APPLICATION OF WOUND VAC Right 01/29/2020   Procedure: APPLICATION OF WOUND VAC;  Surgeon: RRonny Bacon MD;  Location: [facility] ORS;  Service: General;  Laterality: Right;  . BREAST BIOPSY Right 11/06/2019   uKoreacore path pending venus clip  . HERNIA REPAIR Left   . INCISION AND DRAINAGE ABSCESS N/A 01/26/2018   Procedure: INCISION AND DEBRIDEMENT OF VULVAR NECROTIZING SOFT TISSUE INFECTION;  Surgeon: WGreer Pickerel MD;  [place]  Service: General;  Laterality: N/A;  . INCISION AND DRAINAGE PERIRECTAL ABSCESS N/A 01/22/2018   Procedure: IRRIGATION AND DEBRIDEMENT LABIAL/VULVAR AREA;  Surgeon: BCoralie Keens MD;  [place]  Service: General;  Laterality: N/A;  . INCISION AND DRAINAGE PERIRECTAL ABSCESS N/A 01/29/2018   Procedure: IRRIGATION AND DEBRIDEMENT VULVA;  Surgeon: HExcell Seltzer MD;  [place]  Service: General;  Laterality: N/A;  . INGUINAL HERNIA REPAIR Left 04/08/2015   Procedure: OPEN LEFT INGUINAL HERNIA REPAIR WITH MESH;  Surgeon: ARalene Ok MD;  [place]  Service: General;  Laterality: Left;  . INSERTION OF MESH  Left 04/08/2015   Procedure: INSERTION OF MESH;  Surgeon: ARalene Ok MD;  [place]  Service: General;  Laterality: Left;  . LAPAROSCOPY     Endometriosis  . LEFT HEART CATHETERIZATION WITH CORONARY ANGIOGRAM N/A 12/05/2013   Procedure: LEFT HEART CATHETERIZATION WITH CORONARY ANGIOGRAM;  Surgeon: JJettie Booze MD;  [facility];  Service: Cardiovascular;  Laterality: N/A;  . right kidney drained    . SIMPLE MASTECTOMY WITH AXILLARY SENTINEL NODE BIOPSY Right 12/23/2019   Procedure: Right SIMPLE MASTECTOMY WITH AXILLARY SENTINEL NODE BIOPSY;  Surgeon: RRonny Bacon MD;  Location: [facility] OR[facility];  Service: General;  Laterality: Right;  . TEE WITHOUT CARDIOVERSION N/A 11/28/2013   Procedure: TRANSESOPHAGEAL ECHOCARDIOGRAM (TEE);  Surgeon: PThayer Headings MD;  [place]  Service: Cardiovascular;  Laterality: N/A;  . WOUND DEBRIDEMENT Right 01/29/2020   Procedure: DEBRIDEMENT WOUND, Excisional debridement skin, subcutaneous and muscle right chest wall;  Surgeon: RRonny Bacon MD;  Location: [facility] OR[facility];  Service: General;  Laterality: Right;    SOCIAL HISTORY: Social History   Socioeconomic History  . Marital status: Married    Spouse name: Not on file  . Number of children: 2  . Years of education: Not on file  . Highest education  level: Not on file  Occupational History  . Not on file  Tobacco Use  . Smoking status: Current Some Day Smoker    Packs/day: 0.25    Years: 36.00    Pack years: 9.00    Types: Cigarettes    Start date: 07/11/1981  . Smokeless tobacco: Never Used  . Tobacco comment: 1 ppd now  Vaping Use  . Vaping Use: Some days  Substance and Sexual Activity  . Alcohol use: No  . Drug use: No  . Sexual activity: Not Currently  Other Topics Concern  . Not on file  Social History Narrative   ** Merged History Encounter **       Lives in [place]   Married but lives with Boyfriend - not legally separated   Disabled - for  BiPolar, Seizure disorder, diabetes   Formerly worked at [employer] 1 ppd; no alcohol. 2 step sons; no biologic children.       Social Determinants of Health   Financial Resource Strain: Not on file  Food Insecurity: Not on file  Transportation Needs: Not on file  Physical Activity: Not on file  Stress: Not on file  Social Connections: Not on file  Intimate Partner Violence: Not on file    FAMILY HISTORY: Family History  Problem Relation Age of Onset  . Venous thrombosis Brother   . Other Brother        BRAIN TUMOR  . Asthma Father   . Diabetes Father   . Coronary artery disease Mother   . Hypertension Mother   . Diabetes Mother   . Breast cancer Mother 77  . Asthma Sister   . Diabetes type II Brother   . Diabetes Brother     ALLERGIES:  is allergic to adhesive [tape], metoprolol, montelukast, morphine sulfate, penicillins, diltiazem, gabapentin, and midodrine.  MEDICATIONS:  Current Outpatient Medications  Medication Sig Dispense Refill  . acetaminophen (TYLENOL) 500 MG tablet Take 1,000 mg by mouth every 6 (six) hours as needed for moderate pain.     Marland Kitchen albuterol (VENTOLIN HFA) 108 (90 Base) MCG/ACT inhaler Inhale 2 puffs into the lungs every 6 (six) hours as needed for wheezing or shortness of breath.    . ALPRAZolam (XANAX) 1 MG tablet Take 1 mg by mouth 4 (four) times daily as needed for anxiety.     Marland Kitchen anastrozole (ARIMIDEX) 1 MG tablet Take 1 tablet (1 mg total) by mouth daily. (Patient taking differently: Take 1 mg by mouth daily. Will start on 12/27) 60 tablet 2  . aspirin EC 81 MG tablet Take 81 mg by mouth daily before breakfast.     . budesonide-formoterol (SYMBICORT) 80-4.5 MCG/ACT inhaler INHALE 2 PUFFS BY MOUTH EVERY 12 HOURS TO PREVENT COUGH OR WHEEZING *RINSE MOUTH AFTER EACH USE* (Patient taking differently: Inhale 2 puffs into the lungs in the morning and at bedtime.) 10.2 g 3  . benzonatate (TESSALON) 200 MG capsule Take 1 capsule (200 mg total)  by mouth 3 (three) times daily as needed for cough. (Patient not taking: Reported on 10/14/2020) 15 capsule 0  . busPIRone (BUSPAR) 30 MG tablet Take 30 mg by mouth 2 (two) times daily.    . butalbital-acetaminophen-caffeine (FIORICET) 50-325-40 MG tablet Take 1-2 tablets by mouth every 6 (six) hours as needed for headache. 20 tablet 0  . citalopram (CELEXA) 20 MG tablet Take 1 tablet (20 mg total) by mouth daily. 30 tablet 2  . cyclobenzaprine (FLEXERIL) 10 MG  tablet Take 1 tablet (10 mg total) by mouth 2 (two) times daily as needed for muscle spasms. 14 tablet 0  . Dapagliflozin-metFORMIN HCl ER 5-500 MG TB24 Take 1 tablet by mouth daily with breakfast.    . desvenlafaxine (PRISTIQ) 100 MG 24 hr tablet Take 100 mg by mouth daily.    . diclofenac Sodium (VOLTAREN) 1 % GEL Apply 2 g topically 3 (three) times daily as needed. For pain. 100 g 0  . doxycycline (VIBRA-TABS) 100 MG tablet Take 1 tablet (100 mg total) by mouth 2 (two) times daily. 60 tablet 0  . famotidine (PEPCID) 20 MG tablet TAKE 1 TABLET (20 MG TOTAL) BY MOUTH 2 (TWO) TIMES DAILY. FOR HEARTBURN. (Patient taking differently: Take 20 mg by mouth 2 (two) times daily.) 180 tablet 1  . Insulin Pen Needle 32G X 4 MM MISC Used to give insulin injections twice daily. 100 each 11  . insulin regular human CONCENTRATED (HUMULIN R) 500 UNIT/ML injection Inject 30 Units into the skin 3 (three) times daily with meals.     . Insulin Syringe-Needle U-100 (GLOBAL INJECT EASE INSULIN SYR) 30G X 1/2" 1 ML MISC See admin instructions.    Marland Kitchen ipratropium-albuterol (DUONEB) 0.5-2.5 (3) MG/3ML SOLN Take 3 mLs by nebulization every 4 (four) hours as needed (wheezing/shortness of breath). 360 mL 0  . meclizine (ANTIVERT) 12.5 MG tablet Take 1 tablet (12.5 mg total) by mouth 2 (two) times daily as needed for dizziness. 30 tablet 0  . metolazone (ZAROXOLYN) 5 MG tablet Take 5 mg by mouth daily.     . mupirocin ointment (BACTROBAN) 2 % Apply 1 application topically 2  (two) times daily. Apply to the affected area 2 times a day 22 g 3  . nystatin-triamcinolone ointment (MYCOLOG) Apply 1 application topically 2 (two) times daily. 30 g 1  . OneTouch Delica Lancets 30Q MISC 1 each by Does not apply route 3 (three) times daily. Use to monitor glucose levels three times daily; E11.9; E10.42 100 each 11  . ONETOUCH VERIO test strip USE AS DIRECTED TO CHECK BLOOD SUGAR 3 TIMES DAILY 100 each 0  . OXYGEN Inhale 2-4 L into the lungs 2 (two) times daily as needed (shortness of breath).     . pregabalin (LYRICA) 300 MG capsule Take 1 capsule (300 mg total) by mouth 2 (two) times daily. For nerve pain. 180 capsule 0  . promethazine-dextromethorphan (PROMETHAZINE-DM) 6.25-15 MG/5ML syrup Take 5 mLs by mouth 2 (two) times daily as needed for cough. May cause drowsiness. 100 mL 0  . rOPINIRole (REQUIP) 1 MG tablet TAKE 1 TABLET BY MOUTH EVERY NIGHT AT BEDTIME FOR RESTLESS LEGS. 90 tablet 1  . rosuvastatin (CRESTOR) 20 MG tablet Take 1 tablet (20 mg total) by mouth daily. For cholesterol. 90 tablet 3  . spironolactone (ALDACTONE) 50 MG tablet TAKE 1 TABLET BY MOUTH ONCE A DAY 90 tablet 1  . sulfamethoxazole-trimethoprim (BACTRIM DS) 800-160 MG tablet Take 1 tablet by mouth 2 (two) times daily. 20 tablet 0  . torsemide (DEMADEX) 20 MG tablet TAKE 1 TABLET BY MOUTH 4 TIMES DAILY (Patient taking differently: Take 20 mg by mouth 2 (two) times daily.) 360 tablet 3  . traZODone (DESYREL) 100 MG tablet Take 200 mg by mouth at bedtime.     Marland Kitchen ULTICARE MINI PEN NEEDLES 31G X 6 MM MISC USE AS DIRECTED 3 TIMES DAILY 100 each 0   No current facility-administered medications for this visit.      Marland Kitchen  PHYSICAL EXAMINATION: ECOG PERFORMANCE STATUS: 1 - Symptomatic but completely ambulatory  Vitals:   10/14/20 0947  BP: (!) 141/74  Pulse: 78  Resp: 20  Temp: 97.6 F (36.4 C)   Filed Weights   10/14/20 0949  Weight: 251 lb (113.9 kg)    Physical Exam Constitutional:       Comments: Accompanied by husband.  Ambulating independently.  HENT:     Head: Normocephalic and atraumatic.     Mouth/Throat:     Pharynx: No oropharyngeal exudate.  Eyes:     Pupils: Pupils are equal, round, and reactive to light.  Cardiovascular:     Rate and Rhythm: Normal rate and regular rhythm.  Pulmonary:     Effort: No respiratory distress.     Breath sounds: No wheezing.     Comments: Decreased breath sounds bilaterally the bases. Abdominal:     General: Bowel sounds are normal. There is no distension.     Palpations: Abdomen is soft. There is no mass.     Tenderness: There is no abdominal tenderness. There is no guarding or rebound.  Musculoskeletal:        General: No tenderness. Normal range of motion.     Cervical back: Normal range of motion and neck supple.  Skin:    General: Skin is warm.     Comments: Right mastectomy incision-well-healed.  Left breast-behind the nipple upper outer-awaiting 2 cm mass noted.  Mobile.  No skin changes.  No nipple changes or discharge noted.  Neurological:     Mental Status: She is alert and oriented to person, place, and time.  Psychiatric:        Mood and Affect: Affect normal.      LABORATORY DATA:  I have reviewed the data as listed Lab Results  Component Value Date   WBC 8.5 10/14/2020   HGB 13.9 10/14/2020   HCT 42.8 10/14/2020   MCV 85.8 10/14/2020   PLT 214 10/14/2020   Recent Labs    01/28/20 0911 02/25/20 1240 02/25/20 1504 05/15/20 1312 05/17/20 0307 05/17/20 2336 06/29/20 1450 08/03/20 1304 10/01/20 1112 10/14/20 0938  NA 132* 128* 129*   < >  --    < > 134* 135 133* 135  K 3.8 4.3 4.2   < >  --    < > 4.0 4.8 4.9 4.0  CL 90* 89* 88*   < >  --    < > 100 98 97 97*  CO2 29 26 28    < >  --    < > 25 27 29 28   GLUCOSE 395* 623* 617*   < >  --    < > 115* 343* 397* 343*  BUN 35* 43* 40*   < >  --    < > 27* 28* 27* 25*  CREATININE 1.42* 1.29* 1.39*   < >  --    < > 1.22* 1.27* 1.09 1.04*  CALCIUM  8.6* 8.8* 8.9   < >  --    < > 9.1 9.4 9.6 9.2  GFRNONAA 43* 48* 44*  --   --    < > 54* 51*  --  >60  GFRAA 49* 56* 51*  --   --   --   --   --   --   --   PROT 8.6*  --  9.1*   < > 8.2*  --  8.6*  --   --  9.0*  ALBUMIN 3.5  --  3.5   < > 3.3*  --  3.5  --   --  3.9  AST 16  --  20   < > 21  --  21  --   --  20  ALT 19  --  20   < > 21  --  19  --   --  19  ALKPHOS 124  --  186*   < > 130*  --  114  --   --  144*  BILITOT 1.3*  --  1.2   < > 1.0  --  0.8  --   --  0.7  BILIDIR  --   --   --   --  0.2  --   --   --   --   --   IBILI  --   --   --   --  0.8  --   --   --   --   --    < > = values in this interval not displayed.       RADIOGRAPHIC STUDIES: I have personally reviewed the radiological images as listed and agreed with the findings in the report. XR Finger Middle Right  Result Date: 09/29/2020 Three-view radiographs of the right middle finger shows no lytic bone abnormalities no evidence of osteomyelitis.   ASSESSMENT & PLAN:   Carcinoma of lower-outer quadrant of right breast in female, estrogen receptor positive ([place]) # T2N0- ER/PR-positive; premenopausal currently on Anastrazole [stopped tam sec to poor tolerance].  Previously Lupron declined by insurance.  Patient noncompliant with appointments with gynecology for oophorectomy.  Given the change/new insurance-we will appeal for Lupron again.  #Continue anastrozole for now.  Await Lupron approval/if not good recommend oophorectomy.  # left breast- mass near nipple- recommend Diagnostic mammo/US ASAP  # Migraines- awaiting neurology evaluation   #Depression/anxiety/bipolar-currently on BuSpar/Celexa/trazodone-stable-As per psychiatry.  #Chronic respiratory failure-CHF/COPD-STABLE.   #Poorly controlled diabetes-on insulin; AM- 343;  followed endocrinology.  #Peripheral neuropathy grade 2-3; patient goes on with a walker/cane at baseline- STABLE.    #Followed by care connection/hospice-recommend give phone  number-discussed patient's care.  DISPOSITION: # ASAP- left Diagnostic mammo/US.  # follow up TBD-Dr. B      All questions were answered. The patient knows to call the clinic with any problems, questions or concerns.    Cammie Sickle, MD 10/14/2020 10:58 AM

## 2020-10-15 NOTE — Telephone Encounter (Signed)
RX printed and signed, given to [employer]

## 2020-10-15 NOTE — Addendum Note (Signed)
Addended by: Jearld Fenton on: 10/15/2020 10:26 AM   Modules accepted: Orders

## 2020-10-16 DIAGNOSIS — J449 Chronic obstructive pulmonary disease, unspecified: Secondary | ICD-10-CM | POA: Diagnosis not present

## 2020-10-19 ENCOUNTER — Ambulatory Visit
Admission: RE | Admit: 2020-10-19 | Discharge: 2020-10-19 | Disposition: A | Discharge status: Home / Self Care | Payer: BC Managed Care – PPO | Source: Ambulatory Visit | Attending: Internal Medicine | Admitting: Internal Medicine

## 2020-10-19 ENCOUNTER — Other Ambulatory Visit: Payer: Self-pay

## 2020-10-19 DIAGNOSIS — Z170 Estrogen receptor positive status [ER+]: Secondary | ICD-10-CM

## 2020-10-19 DIAGNOSIS — N63 Unspecified lump in unspecified breast: Secondary | ICD-10-CM

## 2020-10-19 DIAGNOSIS — R928 Other abnormal and inconclusive findings on diagnostic imaging of breast: Secondary | ICD-10-CM | POA: Diagnosis not present

## 2020-10-19 DIAGNOSIS — C50511 Malignant neoplasm of lower-outer quadrant of right female breast: Secondary | ICD-10-CM

## 2020-10-19 DIAGNOSIS — N6489 Other specified disorders of breast: Secondary | ICD-10-CM | POA: Diagnosis not present

## 2020-10-20 ENCOUNTER — Ambulatory Visit: Payer: BC Managed Care – PPO | Admitting: Orthopedic Surgery

## 2020-10-21 ENCOUNTER — Telehealth: Payer: Self-pay | Admitting: Internal Medicine

## 2020-10-21 ENCOUNTER — Other Ambulatory Visit: Payer: Self-pay | Admitting: Internal Medicine

## 2020-10-21 DIAGNOSIS — C50511 Malignant neoplasm of lower-outer quadrant of right female breast: Secondary | ICD-10-CM

## 2020-10-21 NOTE — Telephone Encounter (Signed)
C- please schedule Lupron next week.   Follow up with in 2 months- MD; labs- cbc/cmp/ca-27-29.   GB

## 2020-10-21 NOTE — Addendum Note (Signed)
Addended by: Gloris Ham on: 10/21/2020 02:43 PM   Modules accepted: Orders

## 2020-10-21 NOTE — Progress Notes (Signed)
x

## 2020-10-22 ENCOUNTER — Telehealth: Payer: Self-pay | Admitting: Internal Medicine

## 2020-10-22 ENCOUNTER — Telehealth: Payer: Self-pay | Admitting: Primary Care

## 2020-10-22 DIAGNOSIS — G629 Polyneuropathy, unspecified: Secondary | ICD-10-CM

## 2020-10-22 DIAGNOSIS — E1042 Type 1 diabetes mellitus with diabetic polyneuropathy: Secondary | ICD-10-CM

## 2020-10-22 DIAGNOSIS — M792 Neuralgia and neuritis, unspecified: Secondary | ICD-10-CM

## 2020-10-22 MED ORDER — PREGABALIN 300 MG PO CAPS: 300.0000 mg | ORAL_CAPSULE | Freq: Two times a day (BID) | ORAL | 0 refills | Status: DC

## 2020-10-22 NOTE — Telephone Encounter (Signed)
Refills sent to pharmacy. 

## 2020-10-22 NOTE — Telephone Encounter (Signed)
On 4/13-spoke to patient regarding Lupron injection/approval.    C-Patient states that she is unaware of the Lupron appointment. please confirm with pt.   Follow-up with MD in 2 months.

## 2020-10-22 NOTE — Telephone Encounter (Signed)
Colette- were you able to reach the patient?

## 2020-10-22 NOTE — Telephone Encounter (Signed)
  LAST APPOINTMENT DATE: 10/13/2020   NEXT APPOINTMENT DATE:@5 /24/2022  MEDICATION: pregabalin  PH[facility] [address]  Let patient know to contact pharmacy at the end of the day to make sure medication is ready.  Please notify patient to allow 48-72 hours to process  Encourage patient to contact the pharmacy for refills or they can request refills through [place]:   LAST REFILL:  QTY:  REFILL DATE:    OTHER COMMENTS:    Okay for refill?  Please advise

## 2020-10-22 NOTE — Telephone Encounter (Signed)
She has looked on mychart so nope couldn't leave message and I will try again later today or tomorrow it next week.

## 2020-10-22 NOTE — Telephone Encounter (Signed)
Order has been faxed

## 2020-10-27 ENCOUNTER — Inpatient Hospital Stay: Payer: BC Managed Care – PPO

## 2020-10-27 DIAGNOSIS — F17210 Nicotine dependence, cigarettes, uncomplicated: Secondary | ICD-10-CM | POA: Diagnosis not present

## 2020-10-27 DIAGNOSIS — Z170 Estrogen receptor positive status [ER+]: Secondary | ICD-10-CM | POA: Diagnosis not present

## 2020-10-27 DIAGNOSIS — G43909 Migraine, unspecified, not intractable, without status migrainosus: Secondary | ICD-10-CM | POA: Diagnosis not present

## 2020-10-27 DIAGNOSIS — E1165 Type 2 diabetes mellitus with hyperglycemia: Secondary | ICD-10-CM | POA: Diagnosis not present

## 2020-10-27 DIAGNOSIS — Z79811 Long term (current) use of aromatase inhibitors: Secondary | ICD-10-CM | POA: Diagnosis not present

## 2020-10-27 DIAGNOSIS — N183 Chronic kidney disease, stage 3 unspecified: Secondary | ICD-10-CM | POA: Diagnosis not present

## 2020-10-27 DIAGNOSIS — J441 Chronic obstructive pulmonary disease with (acute) exacerbation: Secondary | ICD-10-CM | POA: Diagnosis not present

## 2020-10-27 DIAGNOSIS — E1142 Type 2 diabetes mellitus with diabetic polyneuropathy: Secondary | ICD-10-CM | POA: Diagnosis not present

## 2020-10-27 DIAGNOSIS — C50511 Malignant neoplasm of lower-outer quadrant of right female breast: Secondary | ICD-10-CM | POA: Diagnosis not present

## 2020-10-27 DIAGNOSIS — N6320 Unspecified lump in the left breast, unspecified quadrant: Secondary | ICD-10-CM | POA: Diagnosis not present

## 2020-10-27 DIAGNOSIS — F319 Bipolar disorder, unspecified: Secondary | ICD-10-CM | POA: Diagnosis not present

## 2020-10-27 DIAGNOSIS — Z794 Long term (current) use of insulin: Secondary | ICD-10-CM | POA: Diagnosis not present

## 2020-10-27 DIAGNOSIS — I509 Heart failure, unspecified: Secondary | ICD-10-CM | POA: Diagnosis not present

## 2020-10-27 DIAGNOSIS — E1122 Type 2 diabetes mellitus with diabetic chronic kidney disease: Secondary | ICD-10-CM | POA: Diagnosis not present

## 2020-10-27 MED ORDER — LEUPROLIDE ACETATE (3 MONTH) 11.25 MG IM KIT
11.2500 mg | PACK | Freq: Once | INTRAMUSCULAR | Status: AC
  Administered 2020-10-27: 11.25 mg via INTRAMUSCULAR
  Filled 2020-10-27: qty 11.25

## 2020-10-28 ENCOUNTER — Inpatient Hospital Stay: Payer: BC Managed Care – PPO

## 2020-11-05 ENCOUNTER — Other Ambulatory Visit: Payer: Self-pay

## 2020-11-05 ENCOUNTER — Emergency Department
Admission: EM | Admit: 2020-11-05 | Discharge: 2020-11-05 | Disposition: A | Discharge status: Home / Self Care | Payer: BC Managed Care – PPO | Attending: Emergency Medicine | Admitting: Emergency Medicine

## 2020-11-05 ENCOUNTER — Emergency Department: Payer: BC Managed Care – PPO

## 2020-11-05 DIAGNOSIS — Z743 Need for continuous supervision: Secondary | ICD-10-CM | POA: Diagnosis not present

## 2020-11-05 DIAGNOSIS — R739 Hyperglycemia, unspecified: Secondary | ICD-10-CM | POA: Diagnosis not present

## 2020-11-05 DIAGNOSIS — I5032 Chronic diastolic (congestive) heart failure: Secondary | ICD-10-CM | POA: Insufficient documentation

## 2020-11-05 DIAGNOSIS — N6320 Unspecified lump in the left breast, unspecified quadrant: Secondary | ICD-10-CM | POA: Diagnosis not present

## 2020-11-05 DIAGNOSIS — Z7982 Long term (current) use of aspirin: Secondary | ICD-10-CM | POA: Diagnosis not present

## 2020-11-05 DIAGNOSIS — Z7984 Long term (current) use of oral hypoglycemic drugs: Secondary | ICD-10-CM | POA: Diagnosis not present

## 2020-11-05 DIAGNOSIS — Z853 Personal history of malignant neoplasm of breast: Secondary | ICD-10-CM | POA: Insufficient documentation

## 2020-11-05 DIAGNOSIS — Z794 Long term (current) use of insulin: Secondary | ICD-10-CM | POA: Insufficient documentation

## 2020-11-05 DIAGNOSIS — I2510 Atherosclerotic heart disease of native coronary artery without angina pectoris: Secondary | ICD-10-CM | POA: Diagnosis not present

## 2020-11-05 DIAGNOSIS — I10 Essential (primary) hypertension: Secondary | ICD-10-CM | POA: Diagnosis not present

## 2020-11-05 DIAGNOSIS — Z7951 Long term (current) use of inhaled steroids: Secondary | ICD-10-CM | POA: Diagnosis not present

## 2020-11-05 DIAGNOSIS — R4182 Altered mental status, unspecified: Secondary | ICD-10-CM | POA: Diagnosis not present

## 2020-11-05 DIAGNOSIS — N183 Chronic kidney disease, stage 3 unspecified: Secondary | ICD-10-CM | POA: Diagnosis not present

## 2020-11-05 DIAGNOSIS — R531 Weakness: Secondary | ICD-10-CM

## 2020-11-05 DIAGNOSIS — E1165 Type 2 diabetes mellitus with hyperglycemia: Secondary | ICD-10-CM | POA: Diagnosis not present

## 2020-11-05 DIAGNOSIS — G43909 Migraine, unspecified, not intractable, without status migrainosus: Secondary | ICD-10-CM | POA: Diagnosis not present

## 2020-11-05 DIAGNOSIS — R55 Syncope and collapse: Secondary | ICD-10-CM | POA: Diagnosis present

## 2020-11-05 DIAGNOSIS — I130 Hypertensive heart and chronic kidney disease with heart failure and stage 1 through stage 4 chronic kidney disease, or unspecified chronic kidney disease: Secondary | ICD-10-CM | POA: Insufficient documentation

## 2020-11-05 DIAGNOSIS — E1142 Type 2 diabetes mellitus with diabetic polyneuropathy: Secondary | ICD-10-CM | POA: Insufficient documentation

## 2020-11-05 DIAGNOSIS — E1122 Type 2 diabetes mellitus with diabetic chronic kidney disease: Secondary | ICD-10-CM | POA: Insufficient documentation

## 2020-11-05 DIAGNOSIS — Z79811 Long term (current) use of aromatase inhibitors: Secondary | ICD-10-CM | POA: Diagnosis not present

## 2020-11-05 DIAGNOSIS — C50511 Malignant neoplasm of lower-outer quadrant of right female breast: Secondary | ICD-10-CM | POA: Diagnosis not present

## 2020-11-05 DIAGNOSIS — R6889 Other general symptoms and signs: Secondary | ICD-10-CM | POA: Diagnosis not present

## 2020-11-05 DIAGNOSIS — J9610 Chronic respiratory failure, unspecified whether with hypoxia or hypercapnia: Secondary | ICD-10-CM | POA: Diagnosis not present

## 2020-11-05 DIAGNOSIS — J441 Chronic obstructive pulmonary disease with (acute) exacerbation: Secondary | ICD-10-CM | POA: Insufficient documentation

## 2020-11-05 DIAGNOSIS — J9611 Chronic respiratory failure with hypoxia: Secondary | ICD-10-CM | POA: Diagnosis not present

## 2020-11-05 DIAGNOSIS — F17210 Nicotine dependence, cigarettes, uncomplicated: Secondary | ICD-10-CM | POA: Insufficient documentation

## 2020-11-05 DIAGNOSIS — R404 Transient alteration of awareness: Secondary | ICD-10-CM | POA: Diagnosis not present

## 2020-11-05 DIAGNOSIS — Z85841 Personal history of malignant neoplasm of brain: Secondary | ICD-10-CM | POA: Diagnosis not present

## 2020-11-05 DIAGNOSIS — E860 Dehydration: Secondary | ICD-10-CM

## 2020-11-05 DIAGNOSIS — I4510 Unspecified right bundle-branch block: Secondary | ICD-10-CM | POA: Diagnosis not present

## 2020-11-05 DIAGNOSIS — R519 Headache, unspecified: Secondary | ICD-10-CM | POA: Diagnosis not present

## 2020-11-05 DIAGNOSIS — F319 Bipolar disorder, unspecified: Secondary | ICD-10-CM | POA: Diagnosis not present

## 2020-11-05 DIAGNOSIS — Z170 Estrogen receptor positive status [ER+]: Secondary | ICD-10-CM | POA: Diagnosis not present

## 2020-11-05 DIAGNOSIS — I509 Heart failure, unspecified: Secondary | ICD-10-CM | POA: Diagnosis not present

## 2020-11-05 LAB — CBC WITH DIFFERENTIAL/PLATELET
Abs Immature Granulocytes: 0.04 10*3/uL (ref 0.00–0.07)
Basophils Absolute: 0.1 10*3/uL (ref 0.0–0.1)
Basophils Relative: 1 %
Eosinophils Absolute: 0.3 10*3/uL (ref 0.0–0.5)
Eosinophils Relative: 3 %
HCT: 39.8 % (ref 36.0–46.0)
Hemoglobin: 12.9 g/dL (ref 12.0–15.0)
Immature Granulocytes: 0 %
Lymphocytes Relative: 12 %
Lymphs Abs: 1.2 10*3/uL (ref 0.7–4.0)
MCH: 27.9 pg (ref 26.0–34.0)
MCHC: 32.4 g/dL (ref 30.0–36.0)
MCV: 86 fL (ref 80.0–100.0)
Monocytes Absolute: 0.6 10*3/uL (ref 0.1–1.0)
Monocytes Relative: 5 %
Neutro Abs: 8.2 10*3/uL — ABNORMAL HIGH (ref 1.7–7.7)
Neutrophils Relative %: 79 %
Platelets: 163 10*3/uL (ref 150–400)
RBC: 4.63 MIL/uL (ref 3.87–5.11)
RDW: 16.8 % — ABNORMAL HIGH (ref 11.5–15.5)
WBC: 10.4 10*3/uL (ref 4.0–10.5)
nRBC: 0 % (ref 0.0–0.2)

## 2020-11-05 LAB — COMPREHENSIVE METABOLIC PANEL
ALT: 19 U/L (ref 0–44)
AST: 19 U/L (ref 15–41)
Albumin: 3.6 g/dL (ref 3.5–5.0)
Alkaline Phosphatase: 121 U/L (ref 38–126)
Anion gap: 10 (ref 5–15)
BUN: 20 mg/dL (ref 6–20)
CO2: 27 mmol/L (ref 22–32)
Calcium: 9 mg/dL (ref 8.9–10.3)
Chloride: 101 mmol/L (ref 98–111)
Creatinine, Ser: 0.88 mg/dL (ref 0.44–1.00)
GFR, Estimated: >60 mL/min (ref >60)
Glucose, Bld: 270 mg/dL — ABNORMAL HIGH (ref 70–99)
Potassium: 3.3 mmol/L — ABNORMAL LOW (ref 3.5–5.1)
Sodium: 138 mmol/L (ref 135–145)
Total Bilirubin: 1.1 mg/dL (ref 0.3–1.2)
Total Protein: 8 g/dL (ref 6.5–8.1)

## 2020-11-05 LAB — URINALYSIS, COMPLETE (UACMP) WITH MICROSCOPIC
Bacteria, UA: NONE SEEN
Bilirubin Urine: NEGATIVE
Glucose, UA: >=500 mg/dL — AB
Ketones, ur: NEGATIVE mg/dL
Leukocytes,Ua: NEGATIVE
Nitrite: NEGATIVE
Protein, ur: 100 mg/dL — AB
Specific Gravity, Urine: 1.011 (ref 1.005–1.030)
pH: 6 (ref 5.0–8.0)

## 2020-11-05 LAB — BLOOD GAS, VENOUS
Acid-Base Excess: 7 mmol/L — ABNORMAL HIGH (ref 0.0–2.0)
Bicarbonate: 33 mmol/L — ABNORMAL HIGH (ref 20.0–28.0)
O2 Saturation: 89 %
Patient temperature: 37
pCO2, Ven: 52 mmHg (ref 44.0–60.0)
pH, Ven: 7.41 (ref 7.250–7.430)
pO2, Ven: 56 mmHg — ABNORMAL HIGH (ref 32.0–45.0)

## 2020-11-05 LAB — LIPASE, BLOOD: Lipase: 34 U/L (ref 11–51)

## 2020-11-05 LAB — PHOSPHORUS: Phosphorus: 3.8 mg/dL (ref 2.5–4.6)

## 2020-11-05 LAB — MAGNESIUM: Magnesium: 1.9 mg/dL (ref 1.7–2.4)

## 2020-11-05 MED ORDER — SODIUM CHLORIDE 0.9 % IV BOLUS
1000.0000 mL | Freq: Once | INTRAVENOUS | Status: AC
  Administered 2020-11-05: 1000 mL via INTRAVENOUS

## 2020-11-05 NOTE — ED Triage Notes (Signed)
Weakness, syncopal, cancer patient , pt syncopal 3 minutes, left side previous stroke deficit

## 2020-11-05 NOTE — ED Provider Notes (Signed)
[facility] Emergency Department Provider Note  ____________________________________________  Time seen: Approximately 2:43 PM  I have reviewed the triage vital signs and the nursing notes.   HISTORY  Chief Complaint Weakness (Weakness, syncopal, cancer patient , pt syncopal 3 minutes, left side previous stroke deficit )    HPI Emmi Kempen is a 50 y.o. female with a history of CHF CKD COPD CAD diabetes and metastatic breast cancer with mets in the brain undergoing chemo and radiation treatment who comes the ED complaining of generalized weakness and syncope today.  No chest pain shortness of breath or back pain.  No abdominal pain.  No sudden headache or vision change.  Currently denies any pain other than some generalized headache.  States that she has been compliant with her medications.     Past Medical History:  Diagnosis Date  . Anxiety    takes Prozac daily  . Anxiety   . Aortic valve calcification   . Asthma    Advair and Spirva daily  . Asthma   . Bipolar disorder ([place])   . CAD (coronary artery disease)    a. LHC 11/2013 done for CP/fluid retention: mild disease in prox LAD, mild-mod disease in mRCA, EF 60% with normal LVEDP. b. Normal nuc 03/2016.  Marland Kitchen Cancer ([place])   . CHF (congestive heart failure) ([place])   . Chronic diastolic CHF (congestive heart failure) ([place])   . Chronic heart failure with preserved ejection fraction ([place]) 11/16/2018  . CKD (chronic kidney disease), stage II   . COPD (chronic obstructive pulmonary disease) (HCC)    a. nocturnal O2.  Marland Kitchen COPD (chronic obstructive pulmonary disease) ([place])   . Coronary artery disease   . Decreased urine stream   . Diabetes mellitus   . Diabetes mellitus without complication ([place])   . Dyspnea   . Family history of adverse reaction to anesthesia    mom gets nauseated  . GERD (gastroesophageal reflux disease)    takes Pepcid daily  . History of blood clots    left leg 3-89yrs ago  .  Hyperlipidemia   . Hypertension   . Hypertriglyceridemia   . Inguinal hernia, left 01/2015  . Muscle spasm   . Open wound of genital labia   . Peripheral neuropathy   . RBBB   . Seizures ([place])   . Sepsis ([place]) 01/19/2018  . Sinus tachycardia    a. persistent since 2009.  Marland Kitchen Smokers' cough ([place])   . Stroke [facility]) 1989   left sided weakness  . TIA (transient ischemic attack)   . Tobacco abuse   . Vulvar abscess 01/23/2018     Patient Active Problem List   Diagnosis Date Noted  . Chronic migraine without aura without status migrainosus, not intractable 10/01/2020  . Dizziness 10/01/2020  . Acute midline low back pain without sciatica 09/01/2020  . Microscopic hematuria 07/01/2020  . Pulmonary nodules 07/01/2020  . Hav (hallux abducto valgus), unspecified laterality 06/11/2020  . Venous ulcer of ankle, left ([place]) 05/15/2020  . Contracture of hand joint, left 05/15/2020  . Cellulitis and abscess of trunk 03/12/2020  . Status post mastectomy, right 01/14/2020  . Acute kidney injury superimposed on CKD ([place])   . Breast cancer, right breast ([place]) 12/23/2019  . Hyperlipidemia   . Carcinoma of lower-outer quadrant of right breast in female, estrogen receptor positive ([place]) 11/12/2019  . Restrictive lung disease 10/08/2019  . Postmenopausal bleeding 08/30/2019  . Carotid stenosis, asymptomatic, right 08/16/2019  . Nausea and vomiting  07/15/2019  . Cellulitis of left lower extremity 06/04/2019  . Multiple wounds of skin 06/04/2019  . Other injury of unspecified body region, initial encounter 06/04/2019  . Left-sided weakness 09/13/2018  . Weakness of left lower extremity 09/05/2018  . Recurrent falls 09/05/2018  . PAD (peripheral artery disease) ([place]) 08/27/2018  . Chronic congestive heart failure ([place]) 08/02/2018  . Vaginal itching 06/15/2018  . Foot pain, bilateral 05/22/2018  . Chronic upper extremity pain (Secondary Area of Pain) (Bilateral) 04/23/2018  . Diabetic  polyneuropathy associated with type 2 diabetes mellitus ([place]) 04/23/2018  . Long term prescription benzodiazepine use 04/23/2018  . Neurogenic pain 04/23/2018  . Vitamin D insufficiency 01/29/2018  . Elevated C-reactive protein (CRP) 01/29/2018  . Elevated sedimentation rate 01/29/2018  . Pressure injury of skin 01/29/2018  . Poorly controlled type 2 diabetes mellitus ([place]) 01/23/2018  . DNR (do not resuscitate) 01/23/2018  . CKD stage 3 due to type 2 diabetes mellitus ([place]) 01/23/2018  . IBS (irritable bowel syndrome) 01/19/2018  . Elevated serum hCG 01/19/2018  . Chronic lower extremity pain (Primary Area of Pain) (Bilateral) 01/08/2018  . Chronic low back pain [facility] Area of Pain) (Bilateral) w/ sciatica (Bilateral) 01/08/2018  . Chronic pain syndrome 01/08/2018  . Pharmacologic therapy 01/08/2018  . Disorder of skeletal system 01/08/2018  . Problems influencing health status 01/08/2018  . Long term current use of opiate analgesic 01/08/2018  . Restless leg syndrome 08/02/2017  . Nocturnal hypoxemia 03/29/2017  . Vision disturbance 02/28/2017  . CKD (chronic kidney disease), stage II 02/28/2017  . Visual loss, bilateral   . Chronic respiratory failure with hypoxia (HCC)/ nocturnal 02 dep  10/28/2016  . Upper airway cough syndrome 08/22/2016  . Cigarette smoker 06/15/2016  . Simple chronic bronchitis ([place])   . Coronary artery disease involving native heart without angina pectoris 05/16/2016  . Chronic diastolic CHF (congestive heart failure) ([place]) 11/04/2015  . Orthopnea 11/03/2015  . Bilateral leg edema   . Diabetes ([place])   . COPD exacerbation ([place]) 10/15/2015  . Fracture of rib, closed 10/15/2015  . Venous stasis of both lower extremities 03/29/2015  . Preventative health care 02/04/2015  . Essential hypertension 01/02/2015  . Inguinal hernia 11/27/2014  . Allergic rhinitis with postnasal drip 01/21/2014  . Aortic valve disorders 04/18/2013  . Sleep apnea 05/22/2012   . Syncope and collapse 10/09/2011  . Seizure disorder ([place]) 10/09/2011  . Bipolar disorder ([place]) 10/09/2011  . TIA (transient ischemic attack) 06/22/2011  . Constipation 06/15/2011  . HYPERTRIGLYCERIDEMIA 07/17/2007  . Situational anxiety 05/30/2007  . End stage COPD ([place]) 05/30/2007  . Morbid (severe) obesity due to excess calories ([place]) 04/23/2007  . Tobacco abuse 09/07/2006     Past Surgical History:  Procedure Laterality Date  . APPLICATION OF WOUND VAC Right 01/29/2020   Procedure: APPLICATION OF WOUND VAC;  Surgeon: Ronny Bacon, MD;  Location: [facility] ORS;  Service: General;  Laterality: Right;  . BREAST BIOPSY Right 11/06/2019   [place] core path pending venus clip  . HERNIA REPAIR Left   . INCISION AND DRAINAGE ABSCESS N/A 01/26/2018   Procedure: INCISION AND DEBRIDEMENT OF VULVAR NECROTIZING SOFT TISSUE INFECTION;  Surgeon: Greer Pickerel, MD;  Location: [place];  Service: General;  Laterality: N/A;  . INCISION AND DRAINAGE PERIRECTAL ABSCESS N/A 01/22/2018   Procedure: IRRIGATION AND DEBRIDEMENT LABIAL/VULVAR AREA;  Surgeon: Coralie Keens, MD;  Location: [place];  Service: General;  Laterality: N/A;  . INCISION AND DRAINAGE PERIRECTAL ABSCESS N/A 01/29/2018   Procedure:  IRRIGATION AND DEBRIDEMENT VULVA;  Surgeon: Excell Seltzer, MD;  Location: [place];  Service: General;  Laterality: N/A;  . INGUINAL HERNIA REPAIR Left 04/08/2015   Procedure: OPEN LEFT INGUINAL HERNIA REPAIR WITH MESH;  Surgeon: Ralene Ok, MD;  Location: [place];  Service: General;  Laterality: Left;  . INSERTION OF MESH Left 04/08/2015   Procedure: INSERTION OF MESH;  Surgeon: Ralene Ok, MD;  Location: [place];  Service: General;  Laterality: Left;  . LAPAROSCOPY     Endometriosis  . LEFT HEART CATHETERIZATION WITH CORONARY ANGIOGRAM N/A 12/05/2013   Procedure: LEFT HEART CATHETERIZATION WITH CORONARY ANGIOGRAM;  Surgeon: Jettie Booze, MD;  Location: [facility] CATH LAB;  Service: Cardiovascular;   Laterality: N/A;  . MASTECTOMY Right   . right kidney drained    . SIMPLE MASTECTOMY WITH AXILLARY SENTINEL NODE BIOPSY Right 12/23/2019   Procedure: Right SIMPLE MASTECTOMY WITH AXILLARY SENTINEL NODE BIOPSY;  Surgeon: Ronny Bacon, MD;  Location: [facility] OR[facility];  Service: General;  Laterality: Right;  . TEE WITHOUT CARDIOVERSION N/A 11/28/2013   Procedure: TRANSESOPHAGEAL ECHOCARDIOGRAM (TEE);  Surgeon: Thayer Headings, MD;  Location: [place];  Service: Cardiovascular;  Laterality: N/A;  . WOUND DEBRIDEMENT Right 01/29/2020   Procedure: DEBRIDEMENT WOUND, Excisional debridement skin, subcutaneous and muscle right chest wall;  Surgeon: Ronny Bacon, MD;  Location: [facility] ORS;  Service: General;  Laterality: Right;     Prior to Admission medications   Medication Sig Start Date End Date Taking? Authorizing Provider  acetaminophen (TYLENOL) 500 MG tablet Take 1,000 mg by mouth every 6 (six) hours as needed for moderate pain.    Yes [provider]  albuterol (VENTOLIN HFA) 108 (90 Base) MCG/ACT inhaler Inhale 2 puffs into the lungs every 6 (six) hours as needed for wheezing or shortness of breath.   Yes [provider]  ALPRAZolam Duanne Moron) 1 MG tablet Take 1 mg by mouth 4 (four) times daily as needed for anxiety.  12/02/19  Yes [provider]  anastrozole (ARIMIDEX) 1 MG tablet Take 1 tablet (1 mg total) by mouth daily. 06/29/20  Yes Cammie Sickle, MD  aspirin EC 81 MG tablet Take 81 mg by mouth daily before breakfast.    Yes [provider]  budesonide-formoterol (SYMBICORT) 80-4.5 MCG/ACT inhaler INHALE 2 PUFFS BY MOUTH EVERY 12 HOURS TO PREVENT COUGH OR WHEEZING *RINSE MOUTH AFTER EACH USE* Patient taking differently: Inhale 2 puffs into the lungs in the morning and at bedtime. 10/08/19  Yes Lauraine Rinne, NP  busPIRone (BUSPAR) 30 MG tablet Take 30 mg by mouth 2 (two) times daily.   Yes [provider]  butalbital-acetaminophen-caffeine  (FIORICET) 50-325-40 MG tablet Take 1-2 tablets by mouth every 6 (six) hours as needed for headache. 08/06/20 08/06/21 Yes Burns, Wandra Feinstein, NP  citalopram (CELEXA) 20 MG tablet Take 1 tablet (20 mg total) by mouth daily. 12/17/19  Yes Cammie Sickle, MD  cyclobenzaprine (FLEXERIL) 10 MG tablet Take 1 tablet (10 mg total) by mouth 2 (two) times daily as needed for muscle spasms. 09/01/20  Yes Pleas Koch, NP  Dapagliflozin-metFORMIN HCl ER 5-500 MG TB24 Take 1 tablet by mouth daily with breakfast. 12/03/19 12/02/20 Yes [provider]  desvenlafaxine (PRISTIQ) 100 MG 24 hr tablet Take 100 mg by mouth daily.   Yes [provider]  diclofenac Sodium (VOLTAREN) 1 % GEL Apply 2 g topically 3 (three) times daily as needed. For pain. 09/01/20  Yes Pleas Koch, NP  insulin  regular human CONCENTRATED (HUMULIN R) 500 UNIT/ML injection Inject 30 Units into the skin 3 (three) times daily with meals.    Yes [provider]  meclizine (ANTIVERT) 12.5 MG tablet Take 1 tablet (12.5 mg total) by mouth 2 (two) times daily as needed for dizziness. 10/01/20  Yes Pleas Koch, NP  metolazone (ZAROXOLYN) 5 MG tablet Take 5 mg by mouth daily.  11/11/19 11/10/20 Yes [provider]  pregabalin (LYRICA) 300 MG capsule Take 1 capsule (300 mg total) by mouth 2 (two) times daily. For nerve pain. 10/22/20  Yes Pleas Koch, NP  rOPINIRole (REQUIP) 1 MG tablet TAKE 1 TABLET BY MOUTH EVERY NIGHT AT BEDTIME FOR RESTLESS LEGS. 06/08/20  Yes Pleas Koch, NP  rosuvastatin (CRESTOR) 20 MG tablet Take 1 tablet (20 mg total) by mouth daily. For cholesterol. 08/18/20  Yes Pleas Koch, NP  spironolactone (ALDACTONE) 50 MG tablet TAKE 1 TABLET BY MOUTH ONCE A DAY 04/17/20  Yes Patwardhan, Manish J, MD  torsemide (DEMADEX) 20 MG tablet TAKE 1 TABLET BY MOUTH 4 TIMES DAILY Patient taking differently: Take 20 mg by mouth 2 (two) times daily. 07/10/19  Yes Patwardhan, Manish J,  MD  traZODone (DESYREL) 100 MG tablet Take 200 mg by mouth at bedtime.  11/08/18  Yes [provider]  benzonatate (TESSALON) 200 MG capsule Take 1 capsule (200 mg total) by mouth 3 (three) times daily as needed for cough. Patient not taking: No sig reported 07/24/20   Pleas Koch, NP  doxycycline (VIBRA-TABS) 100 MG tablet Take 1 tablet (100 mg total) by mouth 2 (two) times daily. Patient not taking: No sig reported 09/29/20   Newt Minion, MD  famotidine (PEPCID) 20 MG tablet TAKE 1 TABLET (20 MG TOTAL) BY MOUTH 2 (TWO) TIMES DAILY. FOR HEARTBURN. Patient taking differently: Take 20 mg by mouth 2 (two) times daily. 06/05/19   Pleas Koch, NP  Insulin Pen Needle 32G X 4 MM MISC Used to give insulin injections twice daily. 08/23/17   Renato Shin, MD  Insulin Syringe-Needle U-100 (GLOBAL INJECT EASE INSULIN SYR) 30G X 1/2" 1 ML MISC See admin instructions. 05/22/20 05/22/21  [provider]  ipratropium-albuterol (DUONEB) 0.5-2.5 (3) MG/3ML SOLN Take 3 mLs by nebulization every 4 (four) hours as needed (wheezing/shortness of breath). Patient not taking: No sig reported 08/02/17   Pleas Koch, NP  mupirocin ointment (BACTROBAN) 2 % Apply 1 application topically 2 (two) times daily. Apply to the affected area 2 times a day Patient not taking: No sig reported 09/29/20   Newt Minion, MD  nystatin-triamcinolone ointment [facility]) Apply 1 application topically 2 (two) times daily. Patient not taking: No sig reported 04/30/20   Ronny Bacon, MD  OneTouch Delica Lancets 75Z MISC 1 each by Does not apply route 3 (three) times daily. Use to monitor glucose levels three times daily; E11.9; E10.42 09/26/18   Renato Shin, MD  [facility] VERIO test strip USE AS DIRECTED TO CHECK BLOOD SUGAR 3 TIMES DAILY 07/01/20   Renato Shin, MD  OXYGEN Inhale 2-4 L into the lungs 2 (two) times daily as needed (shortness of breath).     [provider]   promethazine-dextromethorphan (PROMETHAZINE-DM) 6.25-15 MG/5ML syrup Take 5 mLs by mouth 2 (two) times daily as needed for cough. May cause drowsiness. Patient not taking: No sig reported 07/24/20   Pleas Koch, NP  sulfamethoxazole-trimethoprim (BACTRIM DS) 800-160 MG tablet Take 1 tablet by mouth 2 (two)  times daily. Patient not taking: No sig reported 09/22/20   Suzan Slick, NP  ULTICARE MINI PEN NEEDLES 31G X 6 MM MISC USE AS DIRECTED 3 TIMES DAILY 05/18/20   Renato Shin, MD     Allergies Adhesive [tape], Metoprolol, Montelukast, Morphine sulfate, Penicillins, Diltiazem, Gabapentin, and Midodrine   Family History  Problem Relation Age of Onset  . Venous thrombosis Brother   . Other Brother        BRAIN TUMOR  . Asthma Father   . Diabetes Father   . Coronary artery disease Mother   . Hypertension Mother   . Diabetes Mother   . Breast cancer Mother 34  . Asthma Sister   . Diabetes type II Brother   . Diabetes Brother     Social History Social History   Tobacco Use  . Smoking status: Current Some Day Smoker    Packs/day: 0.25    Years: 36.00    Pack years: 9.00    Types: Cigarettes    Start date: 07/11/1981  . Smokeless tobacco: Never Used  . Tobacco comment: 1 ppd now  Vaping Use  . Vaping Use: Some days  Substance Use Topics  . Alcohol use: No  . Drug use: No    Review of Systems  Constitutional:   No fever or chills.  ENT:   No sore throat. No rhinorrhea. Cardiovascular:   No chest pain or syncope. Respiratory:   No dyspnea or cough. Gastrointestinal:   Negative for abdominal pain, vomiting and diarrhea.  Musculoskeletal:   Negative for focal pain or swelling All other systems reviewed and are negative except as documented above in ROS and HPI.  ____________________________________________   PHYSICAL EXAM:  VITAL SIGNS: ED Triage Vitals  Enc Vitals Group     BP 11/05/20 1241 (!) 165/83     Pulse Rate 11/05/20 1234 88     Resp 11/05/20  1234 17     Temp 11/05/20 1237 98.5 F (36.9 C)     Temp Source 11/05/20 1234 Oral     SpO2 11/05/20 1234 93 %     Weight --      Height --      Head Circumference --      Peak Flow --      Pain Score 11/05/20 1234 0     Pain Loc --      Pain Edu? --      Excl. in [place]? --     Vital signs reviewed, nursing assessments reviewed.   Constitutional:   Alert and oriented. Non-toxic appearance. Eyes:   Conjunctivae are normal. EOMI. PERRL. ENT      Head:   Normocephalic and atraumatic.      Nose: Normal.      Mouth/Throat: Dry mucous membranes.      Neck:   No meningismus. Full ROM. Hematological/Lymphatic/Immunilogical:   No cervical lymphadenopathy. Cardiovascular:   RRR. Symmetric bilateral radial and DP pulses.  No murmurs. Cap refill less than 2 seconds. Respiratory:   Normal respiratory effort without tachypnea/retractions. Breath sounds are clear and equal bilaterally. No wheezes/rales/rhonchi. Gastrointestinal:   Soft and nontender. Non distended. There is no CVA tenderness.  No rebound, rigidity, or guarding.  Musculoskeletal:   Normal range of motion in all extremities. No joint effusions.  No lower extremity tenderness.  No edema. Neurologic:   Normal speech and language.  Motor grossly intact. No acute focal neurologic deficits are appreciated.  Skin:    Skin is warm, dry  and intact. No rash noted.  No petechiae, purpura, or bullae.  ____________________________________________    LABS (pertinent positives/negatives) (all labs ordered are listed, but only abnormal results are displayed) Labs Reviewed  COMPREHENSIVE METABOLIC PANEL - Abnormal; Notable for the following components:      Result Value   Potassium 3.3 (*)    Glucose, Bld 270 (*)    All other components within normal limits  CBC WITH DIFFERENTIAL/PLATELET - Abnormal; Notable for the following components:   RDW 16.8 (*)    Neutro Abs 8.2 (*)    All other components within normal limits  BLOOD GAS,  VENOUS - Abnormal; Notable for the following components:   pO2, Ven 56.0 (*)    Bicarbonate 33.0 (*)    Acid-Base Excess 7.0 (*)    All other components within normal limits  URINALYSIS, COMPLETE (UACMP) WITH MICROSCOPIC - Abnormal; Notable for the following components:   Color, Urine STRAW (*)    APPearance CLEAR (*)    Glucose, UA >=500 (*)    Hgb urine dipstick SMALL (*)    Protein, ur 100 (*)    All other components within normal limits  LIPASE, BLOOD  MAGNESIUM  PHOSPHORUS   ____________________________________________   EKG  Interpreted by me Normal sinus rhythm rate of 89, left axis, right bundle branch block.  No acute ischemic changes.  ____________________________________________    RADIOLOGY  DG Chest 1 View  Result Date: 11/05/2020 CLINICAL DATA:  Weakness. Syncopal episode. History of breast cancer. EXAM: CHEST  1 VIEW COMPARISON:  07/16/2020 FINDINGS: Heart size is normal. Chronic interstitial lung markings as seen chronically. No consolidation, collapse or effusion. No acute bone finding. IMPRESSION: No active disease. Chronic interstitial lung markings. Electronically Signed   By: Nelson Chimes M.D.   On: 11/05/2020 13:17   CT Head Wo Contrast  Result Date: 11/05/2020 CLINICAL DATA:  Altered mental status.  Headache. EXAM: CT HEAD WITHOUT CONTRAST TECHNIQUE: Contiguous axial images were obtained from the base of the skull through the vertex without intravenous contrast. COMPARISON:  August 06, 2020. FINDINGS: Brain: No evidence of acute infarction, hemorrhage, hydrocephalus, extra-axial collection or mass lesion/mass effect. Vascular: No hyperdense vessel or unexpected calcification. Skull: Normal. Negative for fracture or focal lesion. Sinuses/Orbits: No acute finding. Other: Stable metallic foreign body is seen in left frontal scalp. IMPRESSION: No acute intracranial abnormality seen. Electronically Signed   By: Marijo Conception M.D.   On: 11/05/2020 13:32     ____________________________________________   PROCEDURES Procedures  ____________________________________________  DIFFERENTIAL DIAGNOSIS   Intracranial hemorrhage, cerebral edema, dehydration, electrolyte abnormality, pneumonia, DKA, hypothyroidism, hypercapnia  CLINICAL IMPRESSION / ASSESSMENT AND PLAN / ED COURSE  Medications ordered in the ED: Medications  sodium chloride 0.9 % bolus 1,000 mL (1,000 mLs Intravenous New Bag/Given 11/05/20 1245)    Pertinent labs & imaging results that were available during my care of the patient were reviewed by me and considered in my medical decision making (see chart for details).  Murial Tobey was evaluated in Emergency Department on 11/05/2020 for the symptoms described in the history of present illness. She was evaluated in the context of the global COVID-19 pandemic, which necessitated consideration that the patient might be at risk for infection with the SARS-CoV-2 virus that causes COVID-19. Institutional protocols and algorithms that pertain to the evaluation of patients at risk for COVID-19 are in a state of rapid change based on information released by regulatory bodies including the CDC and federal and state organizations. These  policies and algorithms were followed during the patient's care in the ED.   Patient presents with generalized weakness and momentary loss of consciousness without red flag symptoms.  With her past history, CT scan of the head obtained which is negative for hemorrhage or edema.  Chest x-ray and labs are all reassuring.  She has some mild hyperglycemia, given IV fluids for hydration.  Vital signs are normal, patient is nontoxic on exam.  Will check orthostatic vital signs.  If stable, patient can be discharged home to follow-up with primary care.      ____________________________________________   FINAL CLINICAL IMPRESSION(S) / ED DIAGNOSES    Final diagnoses:  Type 2 diabetes mellitus with  hyperglycemia, with long-term current use of insulin (HCC)  Generalized weakness  Dehydration  Chronic respiratory failure with hypoxia (HCC)  Morbid (severe) obesity due to excess calories [facility])     ED Discharge Orders    None      Portions of this note were generated with dragon dictation software. Dictation errors may occur despite best attempts at proofreading.   Carrie Mew, MD 11/05/20 1447

## 2020-11-05 NOTE — Discharge Instructions (Signed)
Your lab tests, CT scan of the head, and chest x-ray were all reassuring today.  We gave you IV fluids for hydration.  Continue taking your medications, and check your blood sugar before meals and at bedtime.  Please follow-up with your doctor to review your insulin regimen in case it needs to be adjusted.

## 2020-11-05 NOTE — ED Notes (Signed)
Pt's suction canister emptied, full at around 626mL.

## 2020-11-09 ENCOUNTER — Other Ambulatory Visit: Payer: Self-pay | Admitting: Pulmonary Disease

## 2020-11-09 DIAGNOSIS — J449 Chronic obstructive pulmonary disease, unspecified: Secondary | ICD-10-CM

## 2020-11-10 ENCOUNTER — Telehealth: Payer: Self-pay | Admitting: Surgery

## 2020-11-10 NOTE — Telephone Encounter (Signed)
Encounter created in error

## 2020-11-11 ENCOUNTER — Telehealth: Payer: Self-pay

## 2020-11-11 ENCOUNTER — Telehealth: Payer: Self-pay | Admitting: *Deleted

## 2020-11-11 DIAGNOSIS — C50511 Malignant neoplasm of lower-outer quadrant of right female breast: Secondary | ICD-10-CM

## 2020-11-11 NOTE — Telephone Encounter (Signed)
Would recommend follow-up appointment to address fluid or if palliative is still following perhaps one of their providers could check on pt.   Med list says she is taking Torsemide 20 mg BID. If not the case could try increasing for 2-3 days to see if improvement.   [place] for bariatric bedside commode - let me know what is needed.

## 2020-11-11 NOTE — Telephone Encounter (Signed)
[name] with [facility] called reporting that patient told her that we were going to refer her to a GYN, but she has not heard anything about an appointment. I do not see anything documented in her chart about referring her to GYN. Please advise

## 2020-11-11 NOTE — Telephone Encounter (Signed)
This pt is new to you

## 2020-11-11 NOTE — Telephone Encounter (Signed)
Dr. B would like pt to be evaluated in the GYN oncology clinic.   T2N0- ER/PR-positive; premenopausal currently on Anastrazole [stopped tam sec to poor tolerance]. Patient noncompliant with appointments with gynecology for oophorectomy.

## 2020-11-11 NOTE — Telephone Encounter (Signed)
[name], RN from [facility] called and stated that since the patient is in palliative care they are unable to order the bariatric bedside commode for patient and that it would have to be ordered through an equipment company. Patient currently uses [employer] for her oxygen. Numbers provided are: [phone] and [phone]. RN also reported that patient has had a 10 pound weight gain in a month and she noticed that the patient was experiencing non-pitting edema in her bilateral lower extremities as well as swelling in her abdomen. Called and spoke with patient who confirmed the weight gain and swelling. Patient also reports that she is having to use more pillows when she sleeps, but denied SOB, chest pain, or other acute abnormalities. Patient has been taking Torsemide and Spironolactone as prescribed. Please advise.

## 2020-11-12 ENCOUNTER — Other Ambulatory Visit: Payer: Self-pay

## 2020-11-12 DIAGNOSIS — G8929 Other chronic pain: Secondary | ICD-10-CM

## 2020-11-12 DIAGNOSIS — M545 Low back pain, unspecified: Secondary | ICD-10-CM

## 2020-11-12 NOTE — Addendum Note (Signed)
Addended by: Francella Solian on: 11/12/2020 10:39 AM   Modules accepted: Orders

## 2020-11-12 NOTE — Telephone Encounter (Signed)
FYI called patient has been taking meds as directed. Have made f/u in our office for st gain and swelling for 5/6. Per nurse not able to provide with DME order she is not in hospice. Have sent order for commode per patient request.

## 2020-11-12 NOTE — Telephone Encounter (Signed)
Noted, agree with scheduled appt with Dr. Diona Browner. Will route as FYI

## 2020-11-12 NOTE — Telephone Encounter (Signed)
New patient coordinator notified to schedule

## 2020-11-13 ENCOUNTER — Ambulatory Visit: Payer: Medicare Other | Admitting: Family Medicine

## 2020-11-13 ENCOUNTER — Other Ambulatory Visit: Payer: Self-pay | Admitting: Endocrinology

## 2020-11-13 NOTE — Telephone Encounter (Signed)
Noted  

## 2020-11-15 DIAGNOSIS — J449 Chronic obstructive pulmonary disease, unspecified: Secondary | ICD-10-CM | POA: Diagnosis not present

## 2020-11-16 ENCOUNTER — Encounter: Payer: Self-pay | Admitting: Internal Medicine

## 2020-11-16 ENCOUNTER — Other Ambulatory Visit: Payer: Self-pay

## 2020-11-16 ENCOUNTER — Ambulatory Visit (INDEPENDENT_AMBULATORY_CARE_PROVIDER_SITE_OTHER): Payer: Medicare Other | Admitting: Internal Medicine

## 2020-11-16 VITALS — BP 112/66 | HR 82 | Temp 97.4°F | Ht 64.0 in | Wt 262.0 lb

## 2020-11-16 DIAGNOSIS — E1165 Type 2 diabetes mellitus with hyperglycemia: Secondary | ICD-10-CM | POA: Diagnosis not present

## 2020-11-16 DIAGNOSIS — I5033 Acute on chronic diastolic (congestive) heart failure: Secondary | ICD-10-CM | POA: Diagnosis not present

## 2020-11-16 HISTORY — DX: Acute on chronic diastolic (congestive) heart failure: I50.33

## 2020-11-16 MED ORDER — TORSEMIDE 20 MG PO TABS: 40.0000 mg | ORAL_TABLET | Freq: Two times a day (BID) | ORAL | 0 refills | Status: DC

## 2020-11-16 NOTE — Patient Instructions (Signed)
Please increase the torsemide to 2 tabs (20mg  each for 40mg ) twice a day---morning and just after lunch. Continue the spironolactone as well. Set up to see Ms Carlis Abbott next week---and call Dr Ubaldo Glassing for an appointment as soon as you can get in.

## 2020-11-16 NOTE — Progress Notes (Signed)
Subjective:    Patient ID: [name], female    DOB: 1970-09-25, 50 y.o.   MRN: [id number]  HPI Here due to increased edema This visit occurred during the SARS-CoV-2 public health emergency.  Safety protocols were in place, including screening questions prior to the visit, additional usage of staff PPE, and extensive cleaning of exam room while observing appropriate contact time as indicated for disinfecting solutions.   Has noted increased swelling in abdomen and in legs Takes 2 torsemide 20mg  regularly Also on spironolactone  Known CHF---last echo in 2020 showed normal EF Has seen Dr Ubaldo Glassing in past--but not in some time Weight has gone up 10# in past month Eating the same Avoids salt  Some SOB--refers to COPD and asthma On oxygen at night or for nap Feels her breathing is worse--more DOE Sleeps in be with head very elevated No PND  States her sugars are "up and down" Last A1c 11% though  Current Outpatient Medications on File Prior to Visit  Medication Sig Dispense Refill  . acetaminophen (TYLENOL) 500 MG tablet Take 1,000 mg by mouth every 6 (six) hours as needed for moderate pain.     Marland Kitchen albuterol (VENTOLIN HFA) 108 (90 Base) MCG/ACT inhaler Inhale 2 puffs into the lungs every 6 (six) hours as needed for wheezing or shortness of breath.    . ALPRAZolam (XANAX) 1 MG tablet Take 1 mg by mouth 4 (four) times daily as needed for anxiety.     Marland Kitchen anastrozole (ARIMIDEX) 1 MG tablet Take 1 tablet (1 mg total) by mouth daily. 60 tablet 2  . aspirin EC 81 MG tablet Take 81 mg by mouth daily before breakfast.     . budesonide-formoterol (SYMBICORT) 80-4.5 MCG/ACT inhaler INHALE 2 PUFFS BY MOUTH EVERY 12 HOURS TO PREVENT COUGH OR WHEEZING *RINSE MOUTH AFTER EACH USE* (Patient taking differently: Inhale 2 puffs into the lungs in the morning and at bedtime.) 10.2 g 3  . busPIRone (BUSPAR) 30 MG tablet Take 30 mg by mouth 2 (two) times daily.    . butalbital-acetaminophen-caffeine  (FIORICET) 50-325-40 MG tablet Take 1-2 tablets by mouth every 6 (six) hours as needed for headache. 20 tablet 0  . citalopram (CELEXA) 20 MG tablet Take 1 tablet (20 mg total) by mouth daily. 30 tablet 2  . cyclobenzaprine (FLEXERIL) 10 MG tablet Take 1 tablet (10 mg total) by mouth 2 (two) times daily as needed for muscle spasms. 14 tablet 0  . Dapagliflozin-metFORMIN HCl ER 5-500 MG TB24 Take 1 tablet by mouth daily with breakfast.    . desvenlafaxine (PRISTIQ) 100 MG 24 hr tablet Take 100 mg by mouth daily.    . diclofenac Sodium (VOLTAREN) 1 % GEL Apply 2 g topically 3 (three) times daily as needed. For pain. 100 g 0  . famotidine (PEPCID) 20 MG tablet TAKE 1 TABLET (20 MG TOTAL) BY MOUTH 2 (TWO) TIMES DAILY. FOR HEARTBURN. (Patient taking differently: Take 20 mg by mouth 2 (two) times daily.) 180 tablet 1  . Insulin Pen Needle 32G X 4 MM MISC Used to give insulin injections twice daily. 100 each 11  . insulin regular human CONCENTRATED (HUMULIN R) 500 UNIT/ML injection Inject 30 Units into the skin 3 (three) times daily with meals.     . Insulin Syringe-Needle U-100 (GLOBAL INJECT EASE INSULIN SYR) 30G X 1/2" 1 ML MISC See admin instructions.    Marland Kitchen ipratropium-albuterol (DUONEB) 0.5-2.5 (3) MG/3ML SOLN Take 3 mLs by nebulization every 4 (four) hours  as needed (wheezing/shortness of breath). 360 mL 0  . meclizine (ANTIVERT) 12.5 MG tablet Take 1 tablet (12.5 mg total) by mouth 2 (two) times daily as needed for dizziness. 30 tablet 0  . nystatin-triamcinolone ointment (MYCOLOG) Apply 1 application topically 2 (two) times daily. 30 g 1  . OneTouch Delica Lancets 35T MISC 1 each by Does not apply route 3 (three) times daily. Use to monitor glucose levels three times daily; E11.9; E10.42 100 each 11  . ONETOUCH VERIO test strip USE AS DIRECTED TO CHECK BLOOD SUGAR 3 TIMES DAILY 100 each 0  . OXYGEN Inhale 2-4 L into the lungs 2 (two) times daily as needed (shortness of breath).     . pregabalin  (LYRICA) 300 MG capsule Take 1 capsule (300 mg total) by mouth 2 (two) times daily. For nerve pain. 180 capsule 0  . rOPINIRole (REQUIP) 1 MG tablet TAKE 1 TABLET BY MOUTH EVERY NIGHT AT BEDTIME FOR RESTLESS LEGS. 90 tablet 1  . rosuvastatin (CRESTOR) 20 MG tablet Take 1 tablet (20 mg total) by mouth daily. For cholesterol. 90 tablet 3  . spironolactone (ALDACTONE) 50 MG tablet TAKE 1 TABLET BY MOUTH ONCE A DAY 90 tablet 1  . torsemide (DEMADEX) 20 MG tablet TAKE 1 TABLET BY MOUTH 4 TIMES DAILY (Patient taking differently: Take 20 mg by mouth 2 (two) times daily.) 360 tablet 3  . traZODone (DESYREL) 100 MG tablet Take 200 mg by mouth at bedtime.     Marland Kitchen ULTICARE MINI PEN NEEDLES 31G X 6 MM MISC USE AS DIRECTED 3 TIMES DAILY 100 each 0  . mupirocin ointment (BACTROBAN) 2 % Apply 1 application topically 2 (two) times daily. Apply to the affected area 2 times a day (Patient not taking: Reported on 11/16/2020) 22 g 3   No current facility-administered medications on file prior to visit.    Allergies  Allergen Reactions  . Adhesive [Tape] Rash and Other (See Comments)    TAKES OFF THE SKIN (CERTAIN MEDICAL TAPES DO THIS!!)  . Metoprolol Shortness Of Breath    Occurrence of shortness of breath after 3 days  . Montelukast Shortness Of Breath  . Morphine Sulfate Anaphylaxis, Shortness Of Breath and Nausea And Vomiting    Swollen Throat - Able to tolerate dilaudid  . Penicillins Anaphylaxis, Hives and Shortness Of Breath    Throat swells Has patient had a PCN reaction causing immediate rash, facial/tongue/throat swelling, SOB or lightheadedness with hypotension: Yes Has patient had a PCN reaction causing severe rash involving mucus membranes or skin necrosis: No Has patient had a PCN reaction that required hospitalization: Yes Has patient had a PCN reaction occurring within the last 10 years: No If all of the above answers are "NO", then may proceed with Cephalosporin use.   . Diltiazem Swelling  .  Gabapentin Swelling  . Midodrine     Lightheaded and falling down    Past Medical History:  Diagnosis Date  . Anxiety    takes Prozac daily  . Anxiety   . Aortic valve calcification   . Asthma    Advair and Spirva daily  . Asthma   . Bipolar disorder ([place])   . CAD (coronary artery disease)    a. LHC 11/2013 done for CP/fluid retention: mild disease in prox LAD, mild-mod disease in mRCA, EF 60% with normal LVEDP. b. Normal nuc 03/2016.  Marland Kitchen Cancer ([place])   . CHF (congestive heart failure) ([place])   . Chronic diastolic CHF (congestive heart failure) (  [place])   . Chronic heart failure with preserved ejection fraction ([place]) 11/16/2018  . CKD (chronic kidney disease), stage II   . COPD (chronic obstructive pulmonary disease) ([facility])    a. nocturnal O2.  Marland Kitchen COPD (chronic obstructive pulmonary disease) ([place])   . Coronary artery disease   . Decreased urine stream   . Diabetes mellitus   . Diabetes mellitus without complication ([place])   . Dyspnea   . Family history of adverse reaction to anesthesia    mom gets nauseated  . GERD (gastroesophageal reflux disease)    takes Pepcid daily  . History of blood clots    left leg 3-97yrs ago  . Hyperlipidemia   . Hypertension   . Hypertriglyceridemia   . Inguinal hernia, left 01/2015  . Muscle spasm   . Open wound of genital labia   . Peripheral neuropathy   . RBBB   . Seizures ([place])   . Sepsis ([place]) 01/19/2018  . Sinus tachycardia    a. persistent since 2009.  Marland Kitchen Smokers' cough ([place])   . Stroke [facility]) 1989   left sided weakness  . TIA (transient ischemic attack)   . Tobacco abuse   . Vulvar abscess 01/23/2018    Past Surgical History:  Procedure Laterality Date  . APPLICATION OF WOUND VAC Right 01/29/2020   Procedure: APPLICATION OF WOUND VAC;  Surgeon: Ronny Bacon, MD;  Location: [facility];  Service: General;  Laterality: Right;  . BREAST BIOPSY Right 11/06/2019   [place] core path pending venus clip  . HERNIA REPAIR Left   . INCISION AND  DRAINAGE ABSCESS N/A 01/26/2018   Procedure: INCISION AND DEBRIDEMENT OF VULVAR NECROTIZING SOFT TISSUE INFECTION;  Surgeon: Greer Pickerel, MD;  Location: [place];  Service: General;  Laterality: N/A;  . INCISION AND DRAINAGE PERIRECTAL ABSCESS N/A 01/22/2018   Procedure: IRRIGATION AND DEBRIDEMENT LABIAL/VULVAR AREA;  Surgeon: Coralie Keens, MD;  Location: [place];  Service: General;  Laterality: N/A;  . INCISION AND DRAINAGE PERIRECTAL ABSCESS N/A 01/29/2018   Procedure: IRRIGATION AND DEBRIDEMENT VULVA;  Surgeon: Excell Seltzer, MD;  Location: [place];  Service: General;  Laterality: N/A;  . INGUINAL HERNIA REPAIR Left 04/08/2015   Procedure: OPEN LEFT INGUINAL HERNIA REPAIR WITH MESH;  Surgeon: Ralene Ok, MD;  Location: [place];  Service: General;  Laterality: Left;  . INSERTION OF MESH Left 04/08/2015   Procedure: INSERTION OF MESH;  Surgeon: Ralene Ok, MD;  Location: [place];  Service: General;  Laterality: Left;  . LAPAROSCOPY     Endometriosis  . LEFT HEART CATHETERIZATION WITH CORONARY ANGIOGRAM N/A 12/05/2013   Procedure: LEFT HEART CATHETERIZATION WITH CORONARY ANGIOGRAM;  Surgeon: Jettie Booze, MD;  Location: [facility] CATH LAB;  Service: Cardiovascular;  Laterality: N/A;  . MASTECTOMY Right   . right kidney drained    . SIMPLE MASTECTOMY WITH AXILLARY SENTINEL NODE BIOPSY Right 12/23/2019   Procedure: Right SIMPLE MASTECTOMY WITH AXILLARY SENTINEL NODE BIOPSY;  Surgeon: Ronny Bacon, MD;  Location: [facility];  Service: General;  Laterality: Right;  . TEE WITHOUT CARDIOVERSION N/A 11/28/2013   Procedure: TRANSESOPHAGEAL ECHOCARDIOGRAM (TEE);  Surgeon: Thayer Headings, MD;  Location: [place];  Service: Cardiovascular;  Laterality: N/A;  . WOUND DEBRIDEMENT Right 01/29/2020   Procedure: DEBRIDEMENT WOUND, Excisional debridement skin, subcutaneous and muscle right chest wall;  Surgeon: Ronny Bacon, MD;  Location: [facility];  Service: General;  Laterality: Right;    Family  History  Problem Relation Age of Onset  . Venous thrombosis  Brother   . Other Brother        BRAIN TUMOR  . Asthma Father   . Diabetes Father   . Coronary artery disease Mother   . Hypertension Mother   . Diabetes Mother   . Breast cancer Mother 21  . Asthma Sister   . Diabetes type II Brother   . Diabetes Brother     Social History   Socioeconomic History  . Marital status: Married    Spouse name: Not on file  . Number of children: 2  . Years of education: Not on file  . Highest education level: Not on file  Occupational History  . Not on file  Tobacco Use  . Smoking status: Current Some Day Smoker    Packs/day: 0.25    Years: 36.00    Pack years: 9.00    Types: Cigarettes    Start date: 07/11/1981  . Smokeless tobacco: Never Used  . Tobacco comment: 1 ppd now  Vaping Use  . Vaping Use: Some days  Substance and Sexual Activity  . Alcohol use: No  . Drug use: No  . Sexual activity: Not Currently  Other Topics Concern  . Not on file  Social History Narrative   ** Merged History Encounter **       Lives in [place]   Married but lives with Boyfriend - not legally separated   Disabled - for BiPolar, Seizure disorder, diabetes   Formerly worked at [employer] 1 ppd; no alcohol. 2 step sons; no biologic children.       Social Determinants of Health   Financial Resource Strain: Not on file  Food Insecurity: Not on file  Transportation Needs: Not on file  Physical Activity: Not on file  Stress: Not on file  Social Connections: Not on file  Intimate Partner Violence: Not on file   Review of Systems No pain in legs---mostly feet (almost no sensation) They feel tight No ulcers on calves    Objective:   Physical Exam Constitutional:      Appearance: Normal appearance.  Cardiovascular:     Rate and Rhythm: Normal rate and regular rhythm.     Heart sounds: No murmur heard. No gallop.   Pulmonary:     Effort: Pulmonary effort is normal.     Breath  sounds: Normal breath sounds. No wheezing or rales.  Abdominal:     General: There is distension.     Comments: Firm but not tender Hard to tell if fluid there  Musculoskeletal:     Cervical back: Neck supple.     Comments: 2+ tense edema in calves (she isn't sure it is worse)  Lymphadenopathy:     Cervical: No cervical adenopathy.  Neurological:     Mental Status: She is alert.            Assessment & Plan:

## 2020-11-16 NOTE — Assessment & Plan Note (Signed)
Has gained 10# in past month (after losing it with breast cancer Rx) Hard to tell how much worse her DOE is Sleeps sitting up and no PND Clear fluid overload on exam--though not clearly in lungs EF normal on echo in 2020---may need reevaluation  Will double torsemide to 40mg  bid Continue spironolactone Recheck 1 week with [name]---should have repeat labs then

## 2020-11-16 NOTE — Assessment & Plan Note (Signed)
Will need reevaluation of diabetes control with bid insulin, dapagliflozin and metformin

## 2020-11-19 ENCOUNTER — Other Ambulatory Visit: Payer: Self-pay | Admitting: Primary Care

## 2020-11-19 DIAGNOSIS — M792 Neuralgia and neuritis, unspecified: Secondary | ICD-10-CM

## 2020-11-19 DIAGNOSIS — E1042 Type 1 diabetes mellitus with diabetic polyneuropathy: Secondary | ICD-10-CM

## 2020-11-19 DIAGNOSIS — G629 Polyneuropathy, unspecified: Secondary | ICD-10-CM

## 2020-11-19 NOTE — Telephone Encounter (Signed)
This [facility] to [facility], received refill request from [facility]. Can you notify patient?   If she prefers [facility] then we need to make sure she's not picked [facility], then [facility], then I can send to [facility].

## 2020-11-24 ENCOUNTER — Other Ambulatory Visit: Payer: Self-pay | Admitting: Pulmonary Disease

## 2020-11-24 DIAGNOSIS — J449 Chronic obstructive pulmonary disease, unspecified: Secondary | ICD-10-CM

## 2020-11-24 NOTE — Telephone Encounter (Signed)
[facility] no [facility] uses [facility]. Please send refill to [facility]

## 2020-11-25 ENCOUNTER — Other Ambulatory Visit: Payer: Self-pay | Admitting: Endocrinology

## 2020-11-25 ENCOUNTER — Telehealth: Payer: Self-pay | Admitting: Primary Care

## 2020-11-25 ENCOUNTER — Other Ambulatory Visit: Payer: Self-pay | Admitting: Primary Care

## 2020-11-25 DIAGNOSIS — G629 Polyneuropathy, unspecified: Secondary | ICD-10-CM

## 2020-11-25 DIAGNOSIS — E1042 Type 1 diabetes mellitus with diabetic polyneuropathy: Secondary | ICD-10-CM

## 2020-11-25 DIAGNOSIS — M792 Neuralgia and neuritis, unspecified: Secondary | ICD-10-CM

## 2020-11-25 DIAGNOSIS — E1165 Type 2 diabetes mellitus with hyperglycemia: Secondary | ICD-10-CM

## 2020-11-25 MED ORDER — PREGABALIN 300 MG PO CAPS: 300.0000 mg | ORAL_CAPSULE | Freq: Two times a day (BID) | ORAL | 0 refills | Status: DC

## 2020-11-25 NOTE — Telephone Encounter (Signed)
[place] with [facility] left v/m requesting cb today about hold up on lyrica refill; [facility] has been faxing refill request for lyrica since May 12,2022; pt is getting "furious" per landon's v/m.

## 2020-11-25 NOTE — Telephone Encounter (Signed)
[facility] pharmacy (caller did not leave a name) left v/m that they have not received the lyrica rx. [facility] request to send lyrica rx or call [facility].

## 2020-11-25 NOTE — Telephone Encounter (Signed)
Per 10/22/20 [facility]. I have called was not picked up. Had them cancel that refill. She does need new one called in to [facility].

## 2020-11-25 NOTE — Telephone Encounter (Signed)
error 

## 2020-11-25 NOTE — Telephone Encounter (Signed)
Pt said she [facility]... pt states she will call them to confirm if they have the Rx and call back to let us know if they do not have it

## 2020-11-25 NOTE — Telephone Encounter (Signed)
Mrs. crayton called and stated it she can get her prescription sent to the [address]

## 2020-12-01 ENCOUNTER — Encounter: Payer: Self-pay | Admitting: Primary Care

## 2020-12-01 ENCOUNTER — Other Ambulatory Visit: Payer: Self-pay

## 2020-12-01 ENCOUNTER — Ambulatory Visit (INDEPENDENT_AMBULATORY_CARE_PROVIDER_SITE_OTHER): Payer: Medicare Other | Admitting: Primary Care

## 2020-12-01 VITALS — BP 124/60 | HR 83 | Temp 97.9°F | Wt 258.2 lb

## 2020-12-01 DIAGNOSIS — R918 Other nonspecific abnormal finding of lung field: Secondary | ICD-10-CM

## 2020-12-01 DIAGNOSIS — I10 Essential (primary) hypertension: Secondary | ICD-10-CM

## 2020-12-01 DIAGNOSIS — M24549 Contracture, unspecified hand: Secondary | ICD-10-CM | POA: Diagnosis not present

## 2020-12-01 DIAGNOSIS — R5383 Other fatigue: Secondary | ICD-10-CM | POA: Diagnosis not present

## 2020-12-01 DIAGNOSIS — J9611 Chronic respiratory failure with hypoxia: Secondary | ICD-10-CM

## 2020-12-01 DIAGNOSIS — E1165 Type 2 diabetes mellitus with hyperglycemia: Secondary | ICD-10-CM

## 2020-12-01 DIAGNOSIS — J449 Chronic obstructive pulmonary disease, unspecified: Secondary | ICD-10-CM

## 2020-12-01 DIAGNOSIS — Z20822 Contact with and (suspected) exposure to covid-19: Secondary | ICD-10-CM | POA: Diagnosis not present

## 2020-12-01 DIAGNOSIS — G43709 Chronic migraine without aura, not intractable, without status migrainosus: Secondary | ICD-10-CM

## 2020-12-01 DIAGNOSIS — I5032 Chronic diastolic (congestive) heart failure: Secondary | ICD-10-CM

## 2020-12-01 LAB — MICROALBUMIN / CREATININE URINE RATIO
Creatinine,U: 21 mg/dL
Microalb Creat Ratio: 254.7 mg/g — ABNORMAL HIGH (ref 0.0–30.0)
Microalb, Ur: 53.4 mg/dL — ABNORMAL HIGH (ref 0.0–1.9)

## 2020-12-01 LAB — CBC WITH DIFFERENTIAL/PLATELET
Basophils Absolute: 0.1 10*3/uL (ref 0.0–0.1)
Basophils Relative: 0.8 % (ref 0.0–3.0)
Eosinophils Absolute: 0.3 10*3/uL (ref 0.0–0.7)
Eosinophils Relative: 4 % (ref 0.0–5.0)
HCT: 38 % (ref 36.0–46.0)
Hemoglobin: 12.6 g/dL (ref 12.0–15.0)
Lymphocytes Relative: 13.5 % (ref 12.0–46.0)
Lymphs Abs: 1.1 10*3/uL (ref 0.7–4.0)
MCHC: 33.2 g/dL (ref 30.0–36.0)
MCV: 85 fl (ref 78.0–100.0)
Monocytes Absolute: 0.5 10*3/uL (ref 0.1–1.0)
Monocytes Relative: 6.1 % (ref 3.0–12.0)
Neutro Abs: 6.4 10*3/uL (ref 1.4–7.7)
Neutrophils Relative %: 75.6 % (ref 43.0–77.0)
Platelets: 187 10*3/uL (ref 150.0–400.0)
RBC: 4.47 Mil/uL (ref 3.87–5.11)
RDW: 16.9 % — ABNORMAL HIGH (ref 11.5–15.5)
WBC: 8.4 10*3/uL (ref 4.0–10.5)

## 2020-12-01 LAB — BASIC METABOLIC PANEL
BUN: 31 mg/dL — ABNORMAL HIGH (ref 6–23)
CO2: 32 mEq/L (ref 19–32)
Calcium: 9.8 mg/dL (ref 8.4–10.5)
Chloride: 97 mEq/L (ref 96–112)
Creatinine, Ser: 1.2 mg/dL (ref 0.40–1.20)
GFR: 52.28 mL/min — ABNORMAL LOW (ref >60.00)
Glucose, Bld: 183 mg/dL — ABNORMAL HIGH (ref 70–99)
Potassium: 4 mEq/L (ref 3.5–5.1)
Sodium: 136 mEq/L (ref 135–145)

## 2020-12-01 LAB — POCT GLYCOSYLATED HEMOGLOBIN (HGB A1C): Hemoglobin A1C: 10.6 % — AB (ref 4.0–5.6)

## 2020-12-01 LAB — BRAIN NATRIURETIC PEPTIDE: Pro B Natriuretic peptide (BNP): 78 pg/mL (ref 0.0–100.0)

## 2020-12-01 MED ORDER — BUDESONIDE-FORMOTEROL FUMARATE 80-4.5 MCG/ACT IN AERO: INHALATION_SPRAY | RESPIRATORY_TRACT | 2 refills | Status: AC

## 2020-12-01 MED ORDER — KETOROLAC TROMETHAMINE 60 MG/2ML IM SOLN
60.0000 mg | Freq: Once | INTRAMUSCULAR | Status: AC
  Administered 2020-12-01: 60 mg via INTRAMUSCULAR

## 2020-12-01 NOTE — Assessment & Plan Note (Signed)
Refills provided for Symbicort, encouraged her to follow up with pulmonology.

## 2020-12-01 NOTE — Assessment & Plan Note (Signed)
Appears stable today, weight is down (according to our scales) down 4 pounds. Unclear if the increased dose of torsemide 40 mg BID is contributing to her fatigue.  Checking BMP and BNP today. Strongly advised she continue daily weights and follow up with cardiologist.

## 2020-12-01 NOTE — Assessment & Plan Note (Addendum)
Acute on chronic, appears sluggish and more fatigued than usual.  Checking labs today. Also swabbing for Covid-19 infection given exposure.

## 2020-12-01 NOTE — Assessment & Plan Note (Signed)
Increased headaches over the last several weeks. IM Toradol 60 mg provided. Okay to use Excedrin Migraine if needed.

## 2020-12-01 NOTE — Assessment & Plan Note (Signed)
Located to bilateral hands, referral placed for occupational therapy for evaluation.

## 2020-12-01 NOTE — Patient Instructions (Addendum)
You need to schedule your CT scan of the chest, please have [name] help you. Call your lung doctor for this.  Go see your diabetes doctor and your heart doctor as discussed.  I sent refills of your Symbicort to the pharmacy.  You will be contacted regarding your referral for the hand therapist.  Please let us know if you have not been contacted within two weeks.   It was a pleasure to see you today!

## 2020-12-01 NOTE — Assessment & Plan Note (Signed)
Uncontrolled with A1C today of 10.6, she is followed by endocrinology and is a very fragile diabetic.  Discussed to schedule a follow up visit ASAP.

## 2020-12-01 NOTE — Assessment & Plan Note (Signed)
Reminded patient of upcoming due CT chest. Discussed to call to schedule as this has been ordered.

## 2020-12-01 NOTE — Assessment & Plan Note (Addendum)
Compliant to home oxygen, she does not have this with her today.  Refills provided for Symbicort inhaler. Strongly advised she follow back up with pulmonology.  Discussed that she is due for her repeat CT of the chest either now or in June/July, this has already been ordered by pulmonology.

## 2020-12-01 NOTE — Progress Notes (Signed)
Subjective:    Patient ID: [name], female    DOB: 09-10-1970, 50 y.o.   MRN: [id number]  HPI  [name] is a very pleasant 50 y.o. female with a significant medical history including uncontrolled type 2 diabetes, CKD, chronic migraines, CHF, CAD, breast cancer, tobacco abuse, polyneuropathy, venous ulcers, seizure disorder, chronic back pain, Bipolar Disorder, cellulitis and abcess, COPD, chronic respiratory failure who presents today for follow up.  She was last evaluated by Dr. Silvio Pate on 11/16/20 for acute on chronic heart failure with 10 pound weight gain in the last month, appeared fluid overloaded on exam so her torsemide was doubled to 40 mg BID, her spironolactone was continued.   Since her last visit she's feeling weak, is sluggish, she doesn't feel well since the increased dose of torsemide 40 mg BID. She is weighing herself everyday which is running 250-260, she will see the 260's if she skips the dose of torsemide 40 mg in the evening.   She follows with cardiology, has an appointment scheduled for July 2022.  She follows with endocrinology through [facility], A1C today of 10.6, 11.1 in December 2021. No recent endocrinology visit.   She continues to feel fatigued which began one month ago. She denies rectal or vaginal bleeding. She is compliant to her oxygen at home. Her husband wants her iron level checked. She is compliant to her insulin as prescribed. She was exposed to Covid-19 infection on 11/25/20, denies feeling more fatigued, also denies fevers, chills but she does have headaches.   She would also like to see someone for contractures to her hands. She's noticed these contractures to her hands and the inability to write or pick up smaller items. She has to have a cup that has a handle. She denies joint pain, but she does have her chronic neuropathy. She's doing well on Lyrica 300 mg BID for neuropathy.   She is also needing refills of her Symbicort inhaler,  hasn't seen her pulmonologist in months, she ran out about one month ago.   BP Readings from Last 3 Encounters:  12/01/20 124/60  11/16/20 112/66  11/05/20 (!) 145/76   Wt Readings from Last 3 Encounters:  12/01/20 258 lb 4 oz (117.1 kg)  11/16/20 262 lb (118.8 kg)  10/14/20 251 lb (113.9 kg)      Review of Systems  Constitutional: Positive for fatigue. Negative for fever.  HENT: Negative for congestion.   Respiratory: Positive for cough, shortness of breath and wheezing.   Musculoskeletal: Positive for arthralgias.  Neurological: Positive for headaches.         Past Medical History:  Diagnosis Date  . Anxiety    takes Prozac daily  . Anxiety   . Aortic valve calcification   . Asthma    Advair and Spirva daily  . Asthma   . Bipolar disorder ([place])   . CAD (coronary artery disease)    a. LHC 11/2013 done for CP/fluid retention: mild disease in prox LAD, mild-mod disease in mRCA, EF 60% with normal LVEDP. b. Normal nuc 03/2016.  Marland Kitchen Cancer ([place])   . CHF (congestive heart failure) ([place])   . Chronic diastolic CHF (congestive heart failure) ([place])   . Chronic heart failure with preserved ejection fraction ([place]) 11/16/2018  . CKD (chronic kidney disease), stage II   . COPD (chronic obstructive pulmonary disease) (HCC)    a. nocturnal O2.  Marland Kitchen COPD (chronic obstructive pulmonary disease) ([place])   . Coronary artery disease   .  Decreased urine stream   . Diabetes mellitus   . Diabetes mellitus without complication ([place])   . Dyspnea   . Family history of adverse reaction to anesthesia    mom gets nauseated  . GERD (gastroesophageal reflux disease)    takes Pepcid daily  . History of blood clots    left leg 3-39yrs ago  . Hyperlipidemia   . Hypertension   . Hypertriglyceridemia   . Inguinal hernia, left 01/2015  . Muscle spasm   . Open wound of genital labia   . Peripheral neuropathy   . RBBB   . Seizures ([place])   . Sepsis ([place]) 01/19/2018  . Sinus tachycardia    a.  persistent since 2009.  Marland Kitchen Smokers' cough ([place])   . Stroke [facility]) 1989   left sided weakness  . TIA (transient ischemic attack)   . Tobacco abuse   . Vulvar abscess 01/23/2018    Social History   Socioeconomic History  . Marital status: Married    Spouse name: Not on file  . Number of children: 2  . Years of education: Not on file  . Highest education level: Not on file  Occupational History  . Not on file  Tobacco Use  . Smoking status: Current Some Day Smoker    Packs/day: 0.25    Years: 36.00    Pack years: 9.00    Types: Cigarettes    Start date: 07/11/1981  . Smokeless tobacco: Never Used  . Tobacco comment: 1 ppd now  Vaping Use  . Vaping Use: Some days  Substance and Sexual Activity  . Alcohol use: No  . Drug use: No  . Sexual activity: Not Currently  Other Topics Concern  . Not on file  Social History Narrative   ** Merged History Encounter **       Lives in [place]   Married but lives with Boyfriend - not legally separated   Disabled - for BiPolar, Seizure disorder, diabetes   Formerly worked at [employer] 1 ppd; no alcohol. 2 step sons; no biologic children.       Social Determinants of Health   Financial Resource Strain: Not on file  Food Insecurity: Not on file  Transportation Needs: Not on file  Physical Activity: Not on file  Stress: Not on file  Social Connections: Not on file  Intimate Partner Violence: Not on file    Past Surgical History:  Procedure Laterality Date  . APPLICATION OF WOUND VAC Right 01/29/2020   Procedure: APPLICATION OF WOUND VAC;  Surgeon: Ronny Bacon, MD;  Location: [facility];  Service: General;  Laterality: Right;  . BREAST BIOPSY Right 11/06/2019   [place] core path pending venus clip  . HERNIA REPAIR Left   . INCISION AND DRAINAGE ABSCESS N/A 01/26/2018   Procedure: INCISION AND DEBRIDEMENT OF VULVAR NECROTIZING SOFT TISSUE INFECTION;  Surgeon: Greer Pickerel, MD;  Location: [facility];  Service: General;  Laterality:  N/A;  . INCISION AND DRAINAGE PERIRECTAL ABSCESS N/A 01/22/2018   Procedure: IRRIGATION AND DEBRIDEMENT LABIAL/VULVAR AREA;  Surgeon: Coralie Keens, MD;  Location: [place];  Service: General;  Laterality: N/A;  . INCISION AND DRAINAGE PERIRECTAL ABSCESS N/A 01/29/2018   Procedure: IRRIGATION AND DEBRIDEMENT VULVA;  Surgeon: Excell Seltzer, MD;  Location: [place];  Service: General;  Laterality: N/A;  . INGUINAL HERNIA REPAIR Left 04/08/2015   Procedure: OPEN LEFT INGUINAL HERNIA REPAIR WITH MESH;  Surgeon: Ralene Ok, MD;  Location: [place];  Service: General;  Laterality: Left;  . INSERTION OF MESH Left 04/08/2015   Procedure: INSERTION OF MESH;  Surgeon: Ralene Ok, MD;  Location: [place];  Service: General;  Laterality: Left;  . LAPAROSCOPY     Endometriosis  . LEFT HEART CATHETERIZATION WITH CORONARY ANGIOGRAM N/A 12/05/2013   Procedure: LEFT HEART CATHETERIZATION WITH CORONARY ANGIOGRAM;  Surgeon: Jettie Booze, MD;  Location[facility] C[facility] LAB;  Service: Cardiovascular;  Laterality: N/A;  . MASTECTOMY Right   . right kidney drained    . SIMPLE MASTECTOMY WITH AXILLARY SENTINEL NODE BIOPSY Right 12/23/2019   Procedure: Right SIMPLE MASTECTOMY WITH AXILLARY SENTINEL NODE BIOPSY;  Surgeon: Ronny Bacon, MD;  Location: [facility];  Service: General;  Laterality: Right;  . TEE WITHOUT CARDIOVERSION N/A 11/28/2013   Procedure: TRANSESOPHAGEAL ECHOCARDIOGRAM (TEE);  Surgeon: Thayer Headings, MD;  Location: [place];  Service: Cardiovascular;  Laterality: N/A;  . WOUND DEBRIDEMENT Right 01/29/2020   Procedure: DEBRIDEMENT WOUND, Excisional debridement skin, subcutaneous and muscle right chest wall;  Surgeon: Ronny Bacon, MD;  Location: [facility];  Service: General;  Laterality: Right;    Family History  Problem Relation Age of Onset  . Venous thrombosis Brother   . Other Brother        BRAIN TUMOR  . Asthma Father   . Diabetes Father   . Coronary artery disease Mother   .  Hypertension Mother   . Diabetes Mother   . Breast cancer Mother 12  . Asthma Sister   . Diabetes type II Brother   . Diabetes Brother     Allergies  Allergen Reactions  . Adhesive [Tape] Rash and Other (See Comments)    TAKES OFF THE SKIN (CERTAIN MEDICAL TAPES DO THIS!!)  . Metoprolol Shortness Of Breath    Occurrence of shortness of breath after 3 days  . Montelukast Shortness Of Breath  . Morphine Sulfate Anaphylaxis, Shortness Of Breath and Nausea And Vomiting    Swollen Throat - Able to tolerate dilaudid  . Penicillins Anaphylaxis, Hives and Shortness Of Breath    Throat swells Has patient had a PCN reaction causing immediate rash, facial/tongue/throat swelling, SOB or lightheadedness with hypotension: Yes Has patient had a PCN reaction causing severe rash involving mucus membranes or skin necrosis: No Has patient had a PCN reaction that required hospitalization: Yes Has patient had a PCN reaction occurring within the last 10 years: No If all of the above answers are "NO", then may proceed with Cephalosporin use.   . Diltiazem Swelling  . Gabapentin Swelling  . Midodrine     Lightheaded and falling down    Current Outpatient Medications on File Prior to Visit  Medication Sig Dispense Refill  . acetaminophen (TYLENOL) 500 MG tablet Take 1,000 mg by mouth every 6 (six) hours as needed for moderate pain.     Marland Kitchen albuterol (VENTOLIN HFA) 108 (90 Base) MCG/ACT inhaler Inhale 2 puffs into the lungs every 6 (six) hours as needed for wheezing or shortness of breath.    . ALPRAZolam (XANAX) 1 MG tablet Take 1 mg by mouth 4 (four) times daily as needed for anxiety.     Marland Kitchen anastrozole (ARIMIDEX) 1 MG tablet Take 1 tablet (1 mg total) by mouth daily. 60 tablet 2  . aspirin EC 81 MG tablet Take 81 mg by mouth daily before breakfast.     . budesonide-formoterol (SYMBICORT) 80-4.5 MCG/ACT inhaler INHALE 2 PUFFS BY MOUTH EVERY 12 HOURS TO PREVENT COUGH OR WHEEZING *RINSE MOUTH  AFTER EACH  USE* (Patient taking differently: Inhale 2 puffs into the lungs in the morning and at bedtime.) 10.2 g 3  . busPIRone (BUSPAR) 30 MG tablet Take 30 mg by mouth 2 (two) times daily.    . butalbital-acetaminophen-caffeine (FIORICET) 50-325-40 MG tablet Take 1-2 tablets by mouth every 6 (six) hours as needed for headache. 20 tablet 0  . citalopram (CELEXA) 20 MG tablet Take 1 tablet (20 mg total) by mouth daily. 30 tablet 2  . cyclobenzaprine (FLEXERIL) 10 MG tablet Take 1 tablet (10 mg total) by mouth 2 (two) times daily as needed for muscle spasms. 14 tablet 0  . Dapagliflozin-metFORMIN HCl ER 5-500 MG TB24 Take 1 tablet by mouth daily with breakfast.    . desvenlafaxine (PRISTIQ) 100 MG 24 hr tablet Take 100 mg by mouth daily.    . diclofenac Sodium (VOLTAREN) 1 % GEL Apply 2 g topically 3 (three) times daily as needed. For pain. 100 g 0  . famotidine (PEPCID) 20 MG tablet TAKE 1 TABLET (20 MG TOTAL) BY MOUTH 2 (TWO) TIMES DAILY. FOR HEARTBURN. (Patient taking differently: Take 20 mg by mouth 2 (two) times daily.) 180 tablet 1  . Insulin Pen Needle 32G X 4 MM MISC Used to give insulin injections twice daily. 100 each 11  . insulin regular human CONCENTRATED (HUMULIN R) 500 UNIT/ML injection Inject 30 Units into the skin 3 (three) times daily with meals.     . Insulin Syringe-Needle U-100 (GLOBAL INJECT EASE INSULIN SYR) 30G X 1/2" 1 ML MISC See admin instructions.    Marland Kitchen ipratropium-albuterol (DUONEB) 0.5-2.5 (3) MG/3ML SOLN Take 3 mLs by nebulization every 4 (four) hours as needed (wheezing/shortness of breath). 360 mL 0  . meclizine (ANTIVERT) 12.5 MG tablet Take 1 tablet (12.5 mg total) by mouth 2 (two) times daily as needed for dizziness. 30 tablet 0  . mupirocin ointment (BACTROBAN) 2 % Apply 1 application topically 2 (two) times daily. Apply to the affected area 2 times a day 22 g 3  . nystatin-triamcinolone ointment (MYCOLOG) Apply 1 application topically 2 (two) times daily. 30 g 1  . OneTouch  Delica Lancets 50P MISC 1 each by Does not apply route 3 (three) times daily. Use to monitor glucose levels three times daily; E11.9; E10.42 100 each 11  . ONETOUCH VERIO test strip USE AS DIRECTED TO CHECK BLOOD SUGAR 3 TIMES DAILY 100 each 0  . OXYGEN Inhale 2-4 L into the lungs 2 (two) times daily as needed (shortness of breath).     . pregabalin (LYRICA) 300 MG capsule Take 1 capsule (300 mg total) by mouth 2 (two) times daily. For nerve pain. 180 capsule 0  . rOPINIRole (REQUIP) 1 MG tablet TAKE 1 TABLET BY MOUTH EVERY NIGHT AT BEDTIME FOR RESTLESS LEGS. 90 tablet 1  . rosuvastatin (CRESTOR) 20 MG tablet Take 1 tablet (20 mg total) by mouth daily. For cholesterol. 90 tablet 3  . spironolactone (ALDACTONE) 50 MG tablet TAKE 1 TABLET BY MOUTH ONCE A DAY 90 tablet 1  . torsemide (DEMADEX) 20 MG tablet Take 2 tablets (40 mg total) by mouth 2 (two) times daily. 1 tablet 0  . traZODone (DESYREL) 100 MG tablet Take 200 mg by mouth at bedtime.     Marland Kitchen ULTICARE MINI PEN NEEDLES 31G X 6 MM MISC USE AS DIRECTED 3 TIMES DAILY 100 each 0   No current facility-administered medications on file prior to visit.    BP 124/60 (BP Location: Left  Arm, Patient Position: Sitting, Cuff Size: Normal)   Pulse 83   Temp 97.9 F (36.6 C) (Temporal)   Wt 258 lb 4 oz (117.1 kg)   LMP  (LMP Unknown)   SpO2 96%   BMI 44.33 kg/m  Objective:   Physical Exam Constitutional:      Appearance: She is not ill-appearing.     Comments: Appears tired, not sickly  Cardiovascular:     Rate and Rhythm: Normal rate and regular rhythm.  Pulmonary:     Effort: Pulmonary effort is normal.     Breath sounds: Wheezing present.  Musculoskeletal:     Cervical back: Neck supple.  Skin:    General: Skin is warm and dry.  Psychiatric:        Mood and Affect: Mood normal.           Assessment & Plan:      This visit occurred during the SARS-CoV-2 public health emergency.  Safety protocols were in place, including  screening questions prior to the visit, additional usage of staff PPE, and extensive cleaning of exam room while observing appropriate contact time as indicated for disinfecting solutions.

## 2020-12-01 NOTE — Addendum Note (Signed)
Addended by: Randall An on: 12/01/2020 02:57 PM   Modules accepted: Orders

## 2020-12-01 NOTE — Addendum Note (Signed)
Addended by: Pleas Koch on: 12/01/2020 01:11 PM   Modules accepted: Orders

## 2020-12-01 NOTE — Assessment & Plan Note (Signed)
Stable in the office today, continue torsemide 40 mg BID and spironolactone 50 mg daily.

## 2020-12-02 ENCOUNTER — Telehealth: Payer: Self-pay | Admitting: Primary Care

## 2020-12-02 LAB — SARS-COV-2, NAA 2 DAY TAT

## 2020-12-02 LAB — NOVEL CORONAVIRUS, NAA: SARS-CoV-2, NAA: NOT DETECTED

## 2020-12-02 NOTE — Telephone Encounter (Signed)
LMTCB for results. 

## 2020-12-02 NOTE — Telephone Encounter (Signed)
Calling in about the results

## 2020-12-03 ENCOUNTER — Telehealth: Payer: Self-pay | Admitting: Primary Care

## 2020-12-03 DIAGNOSIS — E785 Hyperlipidemia, unspecified: Secondary | ICD-10-CM | POA: Diagnosis not present

## 2020-12-03 DIAGNOSIS — E1159 Type 2 diabetes mellitus with other circulatory complications: Secondary | ICD-10-CM | POA: Diagnosis not present

## 2020-12-03 DIAGNOSIS — Z794 Long term (current) use of insulin: Secondary | ICD-10-CM | POA: Diagnosis not present

## 2020-12-03 DIAGNOSIS — I152 Hypertension secondary to endocrine disorders: Secondary | ICD-10-CM | POA: Diagnosis not present

## 2020-12-03 DIAGNOSIS — E1142 Type 2 diabetes mellitus with diabetic polyneuropathy: Secondary | ICD-10-CM | POA: Diagnosis not present

## 2020-12-03 DIAGNOSIS — E1169 Type 2 diabetes mellitus with other specified complication: Secondary | ICD-10-CM | POA: Diagnosis not present

## 2020-12-03 NOTE — Telephone Encounter (Signed)
Mrs. Marjo Bicker called in stated that she spoke to [place] [facility] and she stated that she wanted to scooter so she can put in her car to go places and [place] told her the order has to come from her PCP and if not then they would have to order

## 2020-12-03 NOTE — Telephone Encounter (Signed)
I approve the scooter/electric wheelchair, what you need to do from our end? Do I write a paper prescription and fax? Does hospice have any paperwork?

## 2020-12-04 ENCOUNTER — Telehealth: Payer: Self-pay | Admitting: *Deleted

## 2020-12-04 NOTE — Telephone Encounter (Signed)
[name] with [facility] call because she is worried about pt. Her weight is up 4 lbs in one day, yesterday it was 260.6 and today is 264.8. [name] did verify with pt that she is taking 40 mgs BID of torsemide and pt confirmed she has been taking the increased dose of med since it was adjusted. Pt said she is having severe urinary retention and only "dribbles" a little when she goes to the bathroom. Pt has only went to the bathroom twice all day. Pt's abd also feels hard and bloated.   Spoke with Anda Kraft and advised her of pt's conditions and due to complexity of pt she wants [name] to call cardiologist 1st for advisement. Cardiologist info given to [name] and she will call cardiology now  FYI to PCP

## 2020-12-04 NOTE — Telephone Encounter (Signed)
Spoke with patient via phone, she confirms that she has had very little urination over the last 2 to 3 days despite compliance to 80 mg of torsemide 2 times a day.  She also confirms that her weight is up at least 4 pounds from yesterday.  We discussed that she is volume overloaded due to urinary retention and the potential risks of continued urinary retention.  We decided it would be best for her to go to the emergency department for evaluation for a Foley catheter to help her with retention and weight loss.  She agrees and will have her husband take her later this evening.

## 2020-12-04 NOTE — Telephone Encounter (Signed)
That does not really answer the question. I will write a prescription and placed in [name]'s inbox.

## 2020-12-04 NOTE — Telephone Encounter (Signed)
Mrs. [name] called in and stated that we would need to do the ordering due to they can not order through [facility] care.

## 2020-12-08 ENCOUNTER — Encounter: Payer: Self-pay | Admitting: Internal Medicine

## 2020-12-08 ENCOUNTER — Other Ambulatory Visit: Payer: Self-pay | Admitting: Endocrinology

## 2020-12-08 ENCOUNTER — Other Ambulatory Visit: Payer: Self-pay

## 2020-12-08 DIAGNOSIS — I5032 Chronic diastolic (congestive) heart failure: Secondary | ICD-10-CM | POA: Diagnosis not present

## 2020-12-08 DIAGNOSIS — E1165 Type 2 diabetes mellitus with hyperglycemia: Secondary | ICD-10-CM | POA: Diagnosis not present

## 2020-12-08 DIAGNOSIS — I130 Hypertensive heart and chronic kidney disease with heart failure and stage 1 through stage 4 chronic kidney disease, or unspecified chronic kidney disease: Secondary | ICD-10-CM | POA: Diagnosis not present

## 2020-12-08 NOTE — Telephone Encounter (Signed)
Called patient l/m to call office.  We need to find out where what DME provider she would like [place] to send to of if she wants to pick up or mail order to her and she can take it by a medical supply location of her choice.

## 2020-12-09 NOTE — Telephone Encounter (Signed)
Spoke to patient she would like sent to anyone in [place].

## 2020-12-10 ENCOUNTER — Encounter: Payer: Self-pay | Admitting: Primary Care

## 2020-12-11 ENCOUNTER — Encounter: Payer: Self-pay | Admitting: Podiatry

## 2020-12-11 ENCOUNTER — Other Ambulatory Visit: Payer: Self-pay

## 2020-12-11 ENCOUNTER — Ambulatory Visit (INDEPENDENT_AMBULATORY_CARE_PROVIDER_SITE_OTHER): Payer: Medicare Other | Admitting: Podiatry

## 2020-12-11 DIAGNOSIS — M79675 Pain in left toe(s): Secondary | ICD-10-CM | POA: Diagnosis not present

## 2020-12-11 DIAGNOSIS — L989 Disorder of the skin and subcutaneous tissue, unspecified: Secondary | ICD-10-CM

## 2020-12-11 DIAGNOSIS — B351 Tinea unguium: Secondary | ICD-10-CM

## 2020-12-11 DIAGNOSIS — E0843 Diabetes mellitus due to underlying condition with diabetic autonomic (poly)neuropathy: Secondary | ICD-10-CM | POA: Diagnosis not present

## 2020-12-11 DIAGNOSIS — M79674 Pain in right toe(s): Secondary | ICD-10-CM | POA: Diagnosis not present

## 2020-12-11 NOTE — Progress Notes (Signed)
SUBJECTIVE Patient with a history of diabetes mellitus presents to office today complaining of elongated, thickened nails that cause pain while ambulating in shoes.  She is unable to trim her own nails. Patient is here for further evaluation and treatment.   Past Medical History:  Diagnosis Date  . Anxiety    takes Prozac daily  . Anxiety   . Aortic valve calcification   . Asthma    Advair and Spirva daily  . Asthma   . Bipolar disorder ([place])   . CAD (coronary artery disease)    a. LHC 11/2013 done for CP/fluid retention: mild disease in prox LAD, mild-mod disease in mRCA, EF 60% with normal LVEDP. b. Normal nuc 03/2016.  Marland Kitchen Cancer ([place])   . CHF (congestive heart failure) ([place])   . Chronic diastolic CHF (congestive heart failure) ([place])   . Chronic heart failure with preserved ejection fraction ([place]) 11/16/2018  . CKD (chronic kidney disease), stage II   . COPD (chronic obstructive pulmonary disease) (HCC)    a. nocturnal O2.  Marland Kitchen COPD (chronic obstructive pulmonary disease) ([place])   . Coronary artery disease   . Decreased urine stream   . Diabetes mellitus   . Diabetes mellitus without complication ([place])   . Dyspnea   . Family history of adverse reaction to anesthesia    mom gets nauseated  . GERD (gastroesophageal reflux disease)    takes Pepcid daily  . History of blood clots    left leg 3-13yrs ago  . Hyperlipidemia   . Hypertension   . Hypertriglyceridemia   . Inguinal hernia, left 01/2015  . Muscle spasm   . Open wound of genital labia   . Peripheral neuropathy   . RBBB   . Seizures ([place])   . Sepsis ([place]) 01/19/2018  . Sinus tachycardia    a. persistent since 2009.  Marland Kitchen Smokers' cough ([place])   . Stroke [facility]) 1989   left sided weakness  . TIA (transient ischemic attack)   . Tobacco abuse   . Vulvar abscess 01/23/2018    OBJECTIVE General Patient is awake, alert, and oriented x 3 and in no acute distress. Derm Skin is dry and supple bilateral. Negative open lesions  or macerations. Remaining integument unremarkable. Nails are tender, long, thickened and dystrophic with subungual debris, consistent with onychomycosis, 1-5 bilateral. No signs of infection noted. Hyperkeratotic preulcerative callus lesions also noted to the bilateral feet and distal tip of the left third toe Vasc  DP and PT pedal pulses palpable bilaterally. Temperature gradient within normal limits.  Neuro Epicritic and protective threshold sensation diminished bilaterally.  Musculoskeletal Exam No symptomatic pedal deformities noted bilateral. Muscular strength within normal limits.  ASSESSMENT 1. Diabetes Mellitus w/ peripheral neuropathy 2. Onychomycosis of nail due to dermatophyte bilateral 3.  Preulcerative callus lesions bilateral feet  4.  Pain in foot bilateral  PLAN OF CARE 1. Patient evaluated today. 2. Instructed to maintain good pedal hygiene and foot care. Stressed importance of controlling blood sugar.  3. Mechanical debridement of nails 1-5 bilaterally performed using a nail nipper. Filed with dremel without incident.  4.  Excisional debridement of the hyperkeratotic preulcerative callus tissue was performed using a tissue nipper without incident or bleeding  5.  Return to clinic in 3 mos.     [name], DPM [facility]  Dr. [name], DPM    [address]  [place], [place] [address]                Office [phone]  Fax [phone]

## 2020-12-11 NOTE — Telephone Encounter (Signed)
Order and demo and last office not faxed to [employer]

## 2020-12-15 ENCOUNTER — Ambulatory Visit: Payer: Medicare Other | Admitting: Occupational Therapy

## 2020-12-16 DIAGNOSIS — J449 Chronic obstructive pulmonary disease, unspecified: Secondary | ICD-10-CM | POA: Diagnosis not present

## 2020-12-18 ENCOUNTER — Ambulatory Visit: Payer: Medicare Other | Attending: Internal Medicine | Admitting: Occupational Therapy

## 2020-12-23 ENCOUNTER — Inpatient Hospital Stay: Payer: Medicare Other | Attending: Internal Medicine

## 2020-12-23 ENCOUNTER — Other Ambulatory Visit: Payer: Self-pay

## 2020-12-23 ENCOUNTER — Inpatient Hospital Stay (HOSPITAL_BASED_OUTPATIENT_CLINIC_OR_DEPARTMENT_OTHER): Payer: Medicare Other | Admitting: Internal Medicine

## 2020-12-23 ENCOUNTER — Inpatient Hospital Stay: Payer: Medicare Other | Admitting: Obstetrics and Gynecology

## 2020-12-23 DIAGNOSIS — J449 Chronic obstructive pulmonary disease, unspecified: Secondary | ICD-10-CM | POA: Diagnosis not present

## 2020-12-23 DIAGNOSIS — Z170 Estrogen receptor positive status [ER+]: Secondary | ICD-10-CM | POA: Diagnosis not present

## 2020-12-23 DIAGNOSIS — F419 Anxiety disorder, unspecified: Secondary | ICD-10-CM | POA: Insufficient documentation

## 2020-12-23 DIAGNOSIS — F319 Bipolar disorder, unspecified: Secondary | ICD-10-CM | POA: Insufficient documentation

## 2020-12-23 DIAGNOSIS — E119 Type 2 diabetes mellitus without complications: Secondary | ICD-10-CM | POA: Insufficient documentation

## 2020-12-23 DIAGNOSIS — Z79899 Other long term (current) drug therapy: Secondary | ICD-10-CM | POA: Insufficient documentation

## 2020-12-23 DIAGNOSIS — Z79811 Long term (current) use of aromatase inhibitors: Secondary | ICD-10-CM | POA: Insufficient documentation

## 2020-12-23 DIAGNOSIS — G43909 Migraine, unspecified, not intractable, without status migrainosus: Secondary | ICD-10-CM | POA: Diagnosis not present

## 2020-12-23 DIAGNOSIS — G629 Polyneuropathy, unspecified: Secondary | ICD-10-CM | POA: Diagnosis not present

## 2020-12-23 DIAGNOSIS — C50511 Malignant neoplasm of lower-outer quadrant of right female breast: Secondary | ICD-10-CM | POA: Insufficient documentation

## 2020-12-23 LAB — CBC WITH DIFFERENTIAL/PLATELET
Abs Immature Granulocytes: 0.04 10*3/uL (ref 0.00–0.07)
Basophils Absolute: 0.1 10*3/uL (ref 0.0–0.1)
Basophils Relative: 1 %
Eosinophils Absolute: 0.4 10*3/uL (ref 0.0–0.5)
Eosinophils Relative: 4 %
HCT: 38.5 % (ref 36.0–46.0)
Hemoglobin: 12.6 g/dL (ref 12.0–15.0)
Immature Granulocytes: 1 %
Lymphocytes Relative: 19 %
Lymphs Abs: 1.7 10*3/uL (ref 0.7–4.0)
MCH: 28 pg (ref 26.0–34.0)
MCHC: 32.7 g/dL (ref 30.0–36.0)
MCV: 85.6 fL (ref 80.0–100.0)
Monocytes Absolute: 0.6 10*3/uL (ref 0.1–1.0)
Monocytes Relative: 7 %
Neutro Abs: 6.1 10*3/uL (ref 1.7–7.7)
Neutrophils Relative %: 68 %
Platelets: 174 10*3/uL (ref 150–400)
RBC: 4.5 MIL/uL (ref 3.87–5.11)
RDW: 15.9 % — ABNORMAL HIGH (ref 11.5–15.5)
WBC: 8.8 10*3/uL (ref 4.0–10.5)
nRBC: 0 % (ref 0.0–0.2)

## 2020-12-23 LAB — COMPREHENSIVE METABOLIC PANEL
ALT: 18 U/L (ref 0–44)
AST: 21 U/L (ref 15–41)
Albumin: 4 g/dL (ref 3.5–5.0)
Alkaline Phosphatase: 118 U/L (ref 38–126)
Anion gap: 9 (ref 5–15)
BUN: 25 mg/dL — ABNORMAL HIGH (ref 6–20)
CO2: 29 mmol/L (ref 22–32)
Calcium: 9.5 mg/dL (ref 8.9–10.3)
Chloride: 100 mmol/L (ref 98–111)
Creatinine, Ser: 1.05 mg/dL — ABNORMAL HIGH (ref 0.44–1.00)
GFR, Estimated: >60 mL/min (ref >60)
Glucose, Bld: 82 mg/dL (ref 70–99)
Potassium: 3.7 mmol/L (ref 3.5–5.1)
Sodium: 138 mmol/L (ref 135–145)
Total Bilirubin: 1.1 mg/dL (ref 0.3–1.2)
Total Protein: 8.9 g/dL — ABNORMAL HIGH (ref 6.5–8.1)

## 2020-12-23 NOTE — Progress Notes (Unsigned)
Gynecologic Oncology Consult Visit   Referring Provider: Dr [name]  Chief Concern: ER+ breast cancer  Subjective:  [name] is a 50 y.o. female who is seen in consultation from Dr. [name] for ER positive breast cancer to consider therapeutic BSO  Patient treated for ER positive breast cancer.  She is premenopausal and insurance company has denied Lupron for ovarian suppression.   April 2021-2.5 cm right breast mass [incidental-CT scan chest] April 2021- RIGHT BREAST [facility]; s/p ER- 51-90%; PR- 51-90%; Her- 2-NEG. G-2. [Dr.Rodenberg]; s/p  Masectomy- pT2 pN0 (sn); Tamoxifen; Poor candidate for chemo; Discontinue tamoxifen early DEC 2021 [sec to nausea/vomiting diarrhea] December 2021- Anastrazole + eligard DM- poorly controlled; Seizure disorder/Bipolar/TIA ; Diabetes PN- G-3 [cane/walker; falls]; COPD; OSA active smoker    Problem List: Patient Active Problem List   Diagnosis Date Noted   Fatigue 12/01/2020   Acute on chronic diastolic heart failure ([place]) 11/16/2020   Chronic migraine without aura without status migrainosus, not intractable 10/01/2020   Dizziness 10/01/2020   Acute midline low back pain without sciatica 09/01/2020   Microscopic hematuria 07/01/2020   Pulmonary nodules 07/01/2020   Hav (hallux abducto valgus), unspecified laterality 06/11/2020   Venous ulcer of ankle, left ([place]) 05/15/2020   Contracture of hand joint 05/15/2020   Cellulitis and abscess of trunk 03/12/2020   Status post mastectomy, right 01/14/2020   Acute kidney injury superimposed on CKD ([place])    Breast cancer, right breast ([place]) 12/23/2019   Hyperlipidemia    Carcinoma of lower-outer quadrant of right breast in female, estrogen receptor positive ([place]) 11/12/2019   Restrictive lung disease 10/08/2019   Postmenopausal bleeding 08/30/2019   Carotid stenosis, asymptomatic, right 08/16/2019   Nausea and vomiting 07/15/2019   Cellulitis of left lower extremity 06/04/2019   Multiple  wounds of skin 06/04/2019   Other injury of unspecified body region, initial encounter 06/04/2019   Left-sided weakness 09/13/2018   Weakness of left lower extremity 09/05/2018   Recurrent falls 09/05/2018   PAD (peripheral artery disease) ([place]) 08/27/2018   Chronic congestive heart failure ([place]) 08/02/2018   Vaginal itching 06/15/2018   Foot pain, bilateral 05/22/2018   Chronic upper extremity pain (Secondary Area of Pain) (Bilateral) 04/23/2018   Diabetic polyneuropathy associated with type 2 diabetes mellitus ([place]) 04/23/2018   Long term prescription benzodiazepine use 04/23/2018   Neurogenic pain 04/23/2018   Vitamin D insufficiency 01/29/2018   Elevated C-reactive protein (CRP) 01/29/2018   Elevated sedimentation rate 01/29/2018   Pressure injury of skin 01/29/2018   Poorly controlled type 2 diabetes mellitus ([place]) 01/23/2018   DNR (do not resuscitate) 01/23/2018   CKD stage 3 due to type 2 diabetes mellitus ([place]) 01/23/2018   IBS (irritable bowel syndrome) 01/19/2018   Elevated serum hCG 01/19/2018   Chronic lower extremity pain (Primary Area of Pain) (Bilateral) 01/08/2018   Chronic low back pain [facility] Area of Pain) (Bilateral) w/ sciatica (Bilateral) 01/08/2018   Chronic pain syndrome 01/08/2018   Pharmacologic therapy 01/08/2018   Disorder of skeletal system 01/08/2018   Problems influencing health status 01/08/2018   Long term current use of opiate analgesic 01/08/2018   Restless leg syndrome 08/02/2017   Nocturnal hypoxemia 03/29/2017   Vision disturbance 02/28/2017   CKD (chronic kidney disease), stage II 02/28/2017   Visual loss, bilateral    Chronic respiratory failure with hypoxia (HCC)/ nocturnal 02 dep  10/28/2016   Upper airway cough syndrome 08/22/2016   Cigarette smoker 06/15/2016   Simple chronic bronchitis ([place])  Coronary artery disease involving native heart without angina pectoris 05/16/2016   Chronic diastolic CHF (congestive heart failure) ([place])  11/04/2015   Orthopnea 11/03/2015   Bilateral leg edema    Diabetes ([place])    COPD exacerbation ([place]) 10/15/2015   Fracture of rib, closed 10/15/2015   Venous stasis of both lower extremities 03/29/2015   Preventative health care 02/04/2015   Essential hypertension 01/02/2015   Inguinal hernia 11/27/2014   Allergic rhinitis with postnasal drip 01/21/2014   Aortic valve disorders 04/18/2013   Sleep apnea 05/22/2012   Syncope and collapse 10/09/2011   Seizure disorder ([place]) 10/09/2011   Bipolar disorder ([place]) 10/09/2011   TIA (transient ischemic attack) 06/22/2011   Constipation 06/15/2011   HYPERTRIGLYCERIDEMIA 07/17/2007   Situational anxiety 05/30/2007   End stage COPD ([place]) 05/30/2007   Morbid (severe) obesity due to excess calories ([place]) 04/23/2007   Tobacco abuse 09/07/2006    Past Medical History: Past Medical History:  Diagnosis Date   Anxiety    takes Prozac daily   Anxiety    Aortic valve calcification    Asthma    Advair and Spirva daily   Asthma    Bipolar disorder (HCC)    CAD (coronary artery disease)    a. LHC 11/2013 done for CP/fluid retention: mild disease in prox LAD, mild-mod disease in mRCA, EF 60% with normal LVEDP. b. Normal nuc 03/2016.   Cancer (HCC)    CHF (congestive heart failure) (HCC)    Chronic diastolic CHF (congestive heart failure) (HCC)    Chronic heart failure with preserved ejection fraction ([place]) 11/16/2018   CKD (chronic kidney disease), stage II    COPD (chronic obstructive pulmonary disease) (HCC)    a. nocturnal O2.   COPD (chronic obstructive pulmonary disease) (HCC)    Coronary artery disease    Decreased urine stream    Diabetes mellitus    Diabetes mellitus without complication (HCC)    Dyspnea    Family history of adverse reaction to anesthesia    mom gets nauseated   GERD (gastroesophageal reflux disease)    takes Pepcid daily   History of blood clots    left leg 3-74yr ago   Hyperlipidemia    Hypertension     Hypertriglyceridemia    Inguinal hernia, left 01/2015   Muscle spasm    Open wound of genital labia    Peripheral neuropathy    RBBB    Seizures ([place]    Sepsis ([place] 01/19/2018   Sinus tachycardia    a. persistent since 2009.   Smokers' cough ([place]    Stroke [facility] 1989   left sided weakness   TIA (transient ischemic attack)    Tobacco abuse    Vulvar abscess 01/23/2018    Past Surgical History: Past Surgical History:  Procedure Laterality Date   APPLICATION OF WOUND VAC Right 01/29/2020   Procedure: APPLICATION OF WOUND VAC;  Surgeon: RRonny Bacon MD;  Location: [facility] ORS;  Service: General;  Laterality: Right;   BREAST BIOPSY Right 11/06/2019   uKoreacore path pending venus clip   HERNIA REPAIR Left    INCISION AND DRAINAGE ABSCESS N/A 01/26/2018   Procedure: INCISION AND DEBRIDEMENT OF VULVAR NECROTIZING SOFT TISSUE INFECTION;  Surgeon: WGreer Pickerel MD;  [place]: [place]  Service: General;  Laterality: N/A;   INCISION AND DRAINAGE PERIRECTAL ABSCESS N/A 01/22/2018   Procedure: IRRIGATION AND DEBRIDEMENT LABIAL/VULVAR AREA;  Surgeon: BCoralie Keens MD;  [place]  Service: General;  Laterality: N/A;  INCISION AND DRAINAGE PERIRECTAL ABSCESS N/A 01/29/2018   Procedure: IRRIGATION AND DEBRIDEMENT VULVA;  Surgeon: Excell Seltzer, MD;  Location: [place];  Service: General;  Laterality: N/A;   INGUINAL HERNIA REPAIR Left 04/08/2015   Procedure: OPEN LEFT INGUINAL HERNIA REPAIR WITH MESH;  Surgeon: Ralene Ok, MD;  Location: [place];  Service: General;  Laterality: Left;   INSERTION OF MESH Left 04/08/2015   Procedure: INSERTION OF MESH;  Surgeon: Ralene Ok, MD;  Location: [place];  Service: General;  Laterality: Left;   LAPAROSCOPY     Endometriosis   LEFT HEART CATHETERIZATION WITH CORONARY ANGIOGRAM N/A 12/05/2013   Procedure: LEFT HEART CATHETERIZATION WITH CORONARY ANGIOGRAM;  Surgeon: Jettie Booze, MD;  Location: [facility] CATH LAB;  Service: Cardiovascular;   Laterality: N/A;   MASTECTOMY Right    right kidney drained     SIMPLE MASTECTOMY WITH AXILLARY SENTINEL NODE BIOPSY Right 12/23/2019   Procedure: Right SIMPLE MASTECTOMY WITH AXILLARY SENTINEL NODE BIOPSY;  Surgeon: Ronny Bacon, MD;  Location: [facility] ORS;  Service: General;  Laterality: Right;   TEE WITHOUT CARDIOVERSION N/A 11/28/2013   Procedure: TRANSESOPHAGEAL ECHOCARDIOGRAM (TEE);  Surgeon: Thayer Headings, MD;  Location: [place];  Service: Cardiovascular;  Laterality: N/A;   WOUND DEBRIDEMENT Right 01/29/2020   Procedure: DEBRIDEMENT WOUND, Excisional debridement skin, subcutaneous and muscle right chest wall;  Surgeon: Ronny Bacon, MD;  Location: [facility] ORS;  Service: General;  Laterality: Right;    Past Gynecologic History:  Menarche: {NUMBERS 1-66:06301} Menstrual details: Lasts {NUMBERS 0-12:18577} days Menses regular: {YES NO:22349} Last Menstrual Period: *** History of OCP/HRT use: *** History of Abnormal pap: {YES NO:22349}, {Findings; lab pap smear results:16707} Last pap: {Blank multiple:19196} History of STDs: {STD history:20597} Contraception: {PLAN CONTRACEPTION:313102} Sexually active: {Responses; yes/no/not asked:9010}  OB History:  OB History  No obstetric history on file.    Family History: Family History  Problem Relation Age of Onset   Venous thrombosis Brother    Other Brother        BRAIN TUMOR   Asthma Father    Diabetes Father    Coronary artery disease Mother    Hypertension Mother    Diabetes Mother    Breast cancer Mother 24   Asthma Sister    Diabetes type II Brother    Diabetes Brother     Social History: Social History   Socioeconomic History   Marital status: Married    Spouse name: Not on file   Number of children: 2   Years of education: Not on file   Highest education level: Not on file  Occupational History   Not on file  Tobacco Use   Smoking status: Some Days    Packs/day: 0.25    Years: 36.00    Pack years:  9.00    Types: Cigarettes    Start date: 07/11/1981   Smokeless tobacco: Never   Tobacco comments:    1 ppd now  Vaping Use   Vaping Use: Some days  Substance and Sexual Activity   Alcohol use: No   Drug use: No   Sexual activity: Not Currently  Other Topics Concern   Not on file  Social History Narrative   ** Merged History Encounter **       Lives in [place]   Married but lives with Boyfriend - not legally separated   Disabled - for BiPolar, Seizure disorder, diabetes   Formerly worked at [employer] 1 ppd; no alcohol. 2  step sons; no biologic children.       Social Determinants of Health   Financial Resource Strain: Not on file  Food Insecurity: Not on file  Transportation Needs: Not on file  Physical Activity: Not on file  Stress: Not on file  Social Connections: Not on file  Intimate Partner Violence: Not on file    Allergies: Allergies  Allergen Reactions   Adhesive [Tape] Rash and Other (See Comments)    TAKES OFF THE SKIN (CERTAIN MEDICAL TAPES DO THIS!!)   Metoprolol Shortness Of Breath    Occurrence of shortness of breath after 3 days   Montelukast Shortness Of Breath   Morphine Sulfate Anaphylaxis, Shortness Of Breath and Nausea And Vomiting    Swollen Throat - Able to tolerate dilaudid   Penicillins Anaphylaxis, Hives and Shortness Of Breath    Throat swells Has patient had a PCN reaction causing immediate rash, facial/tongue/throat swelling, SOB or lightheadedness with hypotension: Yes Has patient had a PCN reaction causing severe rash involving mucus membranes or skin necrosis: No Has patient had a PCN reaction that required hospitalization: Yes Has patient had a PCN reaction occurring within the last 10 years: No If all of the above answers are "NO", then may proceed with Cephalosporin use.    Diltiazem Swelling   Gabapentin Swelling   Midodrine     Lightheaded and falling down    Current Medications: Current Outpatient Medications   Medication Sig Dispense Refill   acetaminophen (TYLENOL) 500 MG tablet Take 1,000 mg by mouth every 6 (six) hours as needed for moderate pain.      albuterol (VENTOLIN HFA) 108 (90 Base) MCG/ACT inhaler Inhale 2 puffs into the lungs every 6 (six) hours as needed for wheezing or shortness of breath.     ALPRAZolam (XANAX) 1 MG tablet Take 1 mg by mouth 4 (four) times daily as needed for anxiety.      anastrozole (ARIMIDEX) 1 MG tablet Take 1 tablet (1 mg total) by mouth daily. 60 tablet 2   aspirin EC 81 MG tablet Take 81 mg by mouth daily before breakfast.      budesonide-formoterol (SYMBICORT) 80-4.5 MCG/ACT inhaler INHALE 2 PUFFS BY MOUTH EVERY 12 HOURS TO PREVENT COUGH OR WHEEZING *RINSE MOUTH AFTER EACH USE* 10.2 g 2   busPIRone (BUSPAR) 30 MG tablet Take 30 mg by mouth 2 (two) times daily.     butalbital-acetaminophen-caffeine (FIORICET) 50-325-40 MG tablet Take 1-2 tablets by mouth every 6 (six) hours as needed for headache. 20 tablet 0   citalopram (CELEXA) 20 MG tablet Take 1 tablet (20 mg total) by mouth daily. 30 tablet 2   cyclobenzaprine (FLEXERIL) 10 MG tablet Take 1 tablet (10 mg total) by mouth 2 (two) times daily as needed for muscle spasms. 14 tablet 0   desvenlafaxine (PRISTIQ) 100 MG 24 hr tablet Take 100 mg by mouth daily.     diclofenac Sodium (VOLTAREN) 1 % GEL Apply 2 g topically 3 (three) times daily as needed. For pain. 100 g 0   Empagliflozin-metFORMIN HCl ER 25-1000 MG TB24 Take 1 tablet by mouth daily with breakfast.     famotidine (PEPCID) 20 MG tablet TAKE 1 TABLET (20 MG TOTAL) BY MOUTH 2 (TWO) TIMES DAILY. FOR HEARTBURN. (Patient taking differently: Take 20 mg by mouth 2 (two) times daily.) 180 tablet 1   Insulin Pen Needle 32G X 4 MM MISC Used to give insulin injections twice daily. 100 each 11   insulin regular human CONCENTRATED (  HUMULIN R) 500 UNIT/ML injection Inject 30 Units into the skin 3 (three) times daily with meals.      Insulin Syringe-Needle U-100  (GLOBAL INJECT EASE INSULIN SYR) 30G X 1/2" 1 ML MISC See admin instructions.     ipratropium-albuterol (DUONEB) 0.5-2.5 (3) MG/3ML SOLN Take 3 mLs by nebulization every 4 (four) hours as needed (wheezing/shortness of breath). 360 mL 0   meclizine (ANTIVERT) 12.5 MG tablet Take 1 tablet (12.5 mg total) by mouth 2 (two) times daily as needed for dizziness. 30 tablet 0   mupirocin ointment (BACTROBAN) 2 % Apply 1 application topically 2 (two) times daily. Apply to the affected area 2 times a day 22 g 3   nystatin-triamcinolone ointment (MYCOLOG) Apply 1 application topically 2 (two) times daily. 30 g 1   OneTouch Delica Lancets 75I MISC 1 each by Does not apply route 3 (three) times daily. Use to monitor glucose levels three times daily; E11.9; E10.42 100 each 11   ONETOUCH VERIO test strip USE AS DIRECTED TO CHECK BLOOD SUGAR 3 TIMES DAILY 100 each 0   OXYGEN Inhale 2-4 L into the lungs 2 (two) times daily as needed (shortness of breath).      pregabalin (LYRICA) 300 MG capsule Take 1 capsule (300 mg total) by mouth 2 (two) times daily. For nerve pain. 180 capsule 0   rOPINIRole (REQUIP) 1 MG tablet TAKE 1 TABLET BY MOUTH EVERY NIGHT AT BEDTIME FOR RESTLESS LEGS. 90 tablet 1   rosuvastatin (CRESTOR) 20 MG tablet Take 1 tablet (20 mg total) by mouth daily. For cholesterol. 90 tablet 3   spironolactone (ALDACTONE) 50 MG tablet TAKE 1 TABLET BY MOUTH ONCE A DAY 90 tablet 1   torsemide (DEMADEX) 20 MG tablet Take 2 tablets (40 mg total) by mouth 2 (two) times daily. 1 tablet 0   traZODone (DESYREL) 100 MG tablet Take 200 mg by mouth at bedtime.      ULTICARE MINI PEN NEEDLES 31G X 6 MM MISC USE AS DIRECTED 3 TIMES DAILY 100 each 0   No current facility-administered medications for this visit.    Review of Systems General: {Findings; ROS constitutional:30497::"negative for","fevers","chills","fatigue","changes in sleep","changes in weight or appetite"} Skin: {ros; skin:310673::"negative for","changes  in color, texture, moles or lesions"} Eyes: {Findings; ROS eyes:30500::"negative for","changes in vision","pain","diplopia"} HEENT: {Findings; ROS HEENT:30502::"negative for","change in hearing","pain","discharge","tinnitus","vertigo","voice changes","sore throat","neck masses"} Breasts: {ros breast:311036} Pulmonary: {Findings; ROS respiratory:30504::"negative for","dyspnea","orthopnea","productive cough"} Cardiac: {Findings; ROS cardiac:30506::"negative for","palpitations","syncope","pain","discomfort","pressure"} Gastrointestinal: {Findings; ROS gastrointestinal:30513::"negative for","dysphagia","nausea","vomiting","jaundice","pain","constipation","diarrhea","hematemesis","hematochezia"} Genitourinary/Sexual: {Findings; ROS genitourinary:30516::"negative for","dysuria","discharge","hesitancy","nocturia","retention","stones","infections","STD's","incontinence"} Ob/Gyn: {Gyn ros:5267:: "negative for", "irregular bleeding", "pain"} Musculoskeletal: {Findings; ROS musculoskeletal:30524::"negative for","pain","stiffness","swelling","range of motion limitation"} Hematology: {Heme ros:13436:: "negative for", "easy bruising", "bleeding"} Neurologic/Psych: {Findings; ROS neuro:30532::"negative for","headaches","seizures","paralysis","weakness","tremor","change in gait","change in sensation","mood swings","depression","anxiety","change in memory"}  Objective:  Physical Examination:  There were no vitals filed for this visit.    ECOG Performance Status: 1 - Symptomatic but completely ambulatory  General appearance: {Exam; general:16600::"alert","cooperative","appears stated age"} HEENT:{exam; EPPIR:51884} Lymph node survey: {pe lymph nodes:310023::"axillary","inguinal","supraclavicular","non-palpable"} Cardiovascular: {HEART EXAM HEM/ONC:21750} Respiratory: {Auscultation Lung:20254} Breast exam: {pe breast exam:315056::"breasts appear normal, no suspicious masses, no skin or nipple changes or  axillary nodes"}. Abdomen: {Exam; abdomen:14935::"no hernias","well healed incision"} Back: {back exam:311380::"inspection of back is normal"} Extremities: {Exam; extremity:5109} Skin exam - {skin exam:315960::"normal coloration and turgor, no rashes, no suspicious skin lesions noted"}. Neurological exam reveals {ZYSAY:301601::"UXNAT, oriented, normal speech, no focal findings or movement disorder noted"}.  Pelvic: {Pelvic exam:5055::"exam chaperoned by nurse"};  Vulva: {external genitalia female:315901:p:"normal appearing vulva with no masses, tenderness or lesions"}; Vagina: {exam;  vagina:12200:p}; Adnexa: {BLTJQZ:009233:A:"QTMAUQ adnexa in size, nontender and no masses"}; Uterus: {uterus:315905:p:"uterus is normal size, shape, consistency and nontender"}; Cervix: {exam; cervix:14595}; Rectal: {rectal female:311646::"not indicated"}    Lab Review Labs on site today: BMP Latest Ref Rng & Units 12/01/2020 11/05/2020 10/14/2020  Glucose 70 - 99 mg/dL 183(H) 270(H) 343(H)  BUN 6 - 23 mg/dL 31(H) 20 25(H)  Creatinine 0.40 - 1.20 mg/dL 1.20 0.88 1.04(H)  BUN/Creat Ratio 6 - 22 (calc) - - -  Sodium 135 - 145 mEq/L 136 138 135  Potassium 3.5 - 5.1 mEq/L 4.0 3.3(L) 4.0  Chloride 96 - 112 mEq/L 97 101 97(L)  CO2 19 - 32 mEq/L 32 27 28  Calcium 8.4 - 10.5 mg/dL 9.8 9.0 9.2   CBC Latest Ref Rng & Units 12/01/2020 11/05/2020 10/14/2020  WBC 4.0 - 10.5 K/uL 8.4 10.4 8.5  Hemoglobin 12.0 - 15.0 g/dL 12.6 12.9 13.9  Hematocrit 36.0 - 46.0 % 38.0 39.8 42.8  Platelets 150.0 - 400.0 K/uL 187.0 163 214    12/01/20 Hgb A1c 10.6   Radiologic Imaging:     Assessment:  Kaileah Kutner is a 50 y.o. female diagnosed with ER/PR+, HER2- breast cancer.    Medical co-morbidities complicating care: CKD stage 2, Diastolic heart failure, Bipolar, Morbid obesity Plan:   Problem List Items Addressed This Visit       Other   Carcinoma of lower-outer quadrant of right breast in female, estrogen receptor positive  ([place]) - Primary    We discussed options for management including ***. Based on *** , we recommend {Blank single:19197::"definitive surgical evaluation","neoadjuvant chemotherapy","radiation","concurrent chemotherapy and radiation","further testing","***"}.  {Insert risk statement for surgery if preop, .gynoncsurrisk} Suggested return to clinic in  {1-10:18281} {units:10146}.    The patient's diagnosis, an outline of the further diagnostic and laboratory studies which will be required, the recommendation, and alternatives were discussed.  All questions were answered to the patient's satisfaction.  A total of *** minutes were spent with the patient/family today; ***% was spent in education, counseling and coordination of care for {Blank single:19197::"ovarian cancer","cervical cancer","endometrial cancer","pelvic mass","dysplasia","vulvar cancer","***"}.    Mellody Drown, MD    CC:  Cammie Sickle, MD [place],  [place] [id number] [phone]

## 2020-12-23 NOTE — Assessment & Plan Note (Addendum)
#   T2N0- ER/PR-positive; premenopausal currently on Anastrazole [stopped tam sec to poor tolerance].  Patient declines oophorectomy.  Continue Lupron Depo 11.25 mg every 3 months.  #Continue anastrozole for now.  Tolerating fairly well.  # Migraines-awaiting awaiting neurology evaluation do not suspect for malignancy.   #Depression/anxiety/bipolar-currently on BuSpar/Celexa/trazodone stable-As per psychiatry.  #Chronic respiratory failure-CHF/COPD-S stable.   #Poorly controlled diabetes-on insulin; AM- 343;  followed endocrinology.  #Peripheral neuropathy grade 2-3; patient goes on with a walker/cane at baseline- STABLE.    #Followed by [facility]/hospice-recommend give phone number-discussed patient's care.  DISPOSITION: # follow up in late July, 2022- MD; labs- cbc/cmp/ca-27-29; Lupron-Dr.B

## 2020-12-23 NOTE — Progress Notes (Signed)
[facility] NOTE  Patient Care Team: Pleas Koch, NP as PCP - General (Internal Medicine) Rosamaria Lints, MD (Inactive) as Referring Physician (Neurology) Alvester Chou, NP (Nurse Practitioner) Renato Shin, MD as Consulting Physician (Endocrinology) Theodore Demark, RN as Oncology Nurse Navigator Ronny Bacon, MD as Consulting Physician (General Surgery) Wilson, [place] as Registered Nurse Flora Lipps, MD as Consulting Physician (Pulmonary Disease) Sherlynn Stalls, MD as Consulting Physician (Ophthalmology)  CHIEF COMPLAINTS/PURPOSE OF CONSULTATION: Breast cancer.  #  Oncology History Overview Note  #April 2021-2.5 cm right breast mass [incidental-CT scan chest]  # April 2021- RIGHT BREAST [facility]; s/p ER- 51-90%; PR- 51-90%; Her- 2-NEG. G-2. [Dr.Rodenberg]; s/p  Masectomy- pT2 pN0 (sn); Tamoxifen; Poor candidate for chemo;   # Discontinue tamoxifen early DEC 2021 [sec to nausea/vomiting diarrhea]  #Late December 2021- Anastrazole + eligard  # DM- poorly controlled; Seizure disorder/Bipolar/TIA ; Diabetes PN- G-3 [cane/walker; falls]; COPD; OSA active smoker  # SURVIVORSHIP:   # GENETICS:   DIAGNOSIS: Right breast cancer  STAGE:    II     ;  GOALS: goal  CURRENT/MOST RECENT THERAPY : Tam     Carcinoma of lower-outer quadrant of right breast in female, estrogen receptor positive ([place])  11/12/2019 Initial Diagnosis   Carcinoma of lower-outer quadrant of right breast in female, estrogen receptor positive ([place])      HISTORY OF PRESENTING ILLNESS:  [name] 50 y.o.  female premenopausal patient with right-sided stage II ER/PR positive HER-2 negative breast cancer on adjuvant anastrozole with multiple comorbidities-including poorly controlled diabetes/bipolar disorder/active smoker/neuropathy is here for follow-up.  Patient missed gynecology oncology consultation for oophorectomy/ovarian suppression.   Patient complains of  continued intermittent headaches from migraines.  Denies any nausea vomiting.  Chronic neuropathy chronic back pain.  Not any worse.   Review of Systems  Constitutional:  Positive for malaise/fatigue. Negative for chills, diaphoresis, fever and weight loss.  HENT:  Negative for nosebleeds and sore throat.   Eyes:  Negative for double vision.  Respiratory:  Negative for cough, hemoptysis, sputum production, shortness of breath and wheezing.   Cardiovascular:  Negative for chest pain, palpitations, orthopnea and leg swelling.  Gastrointestinal:  Negative for abdominal pain, blood in stool, constipation, diarrhea, heartburn, melena, nausea and vomiting.  Genitourinary:  Negative for dysuria, frequency and urgency.  Musculoskeletal:  Positive for back pain, joint pain and myalgias.  Skin:  Negative for itching.  Neurological:  Positive for tingling. Negative for dizziness, focal weakness, weakness and headaches.  Endo/Heme/Allergies:  Does not bruise/bleed easily.  Psychiatric/Behavioral:  Negative for depression. The patient is not nervous/anxious and does not have insomnia.     MEDICAL HISTORY:  Past Medical History:  Diagnosis Date  . Anxiety    takes Prozac daily  . Anxiety   . Aortic valve calcification   . Asthma    Advair and Spirva daily  . Asthma   . Bipolar disorder ([place])   . CAD (coronary artery disease)    a. LHC 11/2013 done for CP/fluid retention: mild disease in prox LAD, mild-mod disease in mRCA, EF 60% with normal LVEDP. b. Normal nuc 03/2016.  Marland Kitchen Cancer ([place])   . CHF (congestive heart failure) ([place])   . Chronic diastolic CHF (congestive heart failure) ([place])   . Chronic heart failure with preserved ejection fraction ([place]) 11/16/2018  . CKD (chronic kidney disease), stage II   . COPD (chronic obstructive pulmonary disease) (H[facility])    a. nocturnal O2.  Marland Kitchen  COPD (chronic obstructive pulmonary disease) ([place])   . Coronary artery disease   . Decreased urine stream   . Diabetes  mellitus   . Diabetes mellitus without complication ([place])   . Dyspnea   . Family history of adverse reaction to anesthesia    mom gets nauseated  . GERD (gastroesophageal reflux disease)    takes Pepcid daily  . History of blood clots    left leg 3-42yr ago  . Hyperlipidemia   . Hypertension   . Hypertriglyceridemia   . Inguinal hernia, left 01/2015  . Muscle spasm   . Open wound of genital labia   . Peripheral neuropathy   . RBBB   . Seizures ([place]   . Sepsis ([place] 01/19/2018  . Sinus tachycardia    a. persistent since 2009.  .Marland KitchenSmokers' cough ([place]   . Stroke ([facility] 1989   left sided weakness  . TIA (transient ischemic attack)   . Tobacco abuse   . Vulvar abscess 01/23/2018    SURGICAL HISTORY: Past Surgical History:  Procedure Laterality Date  . APPLICATION OF WOUND VAC Right 01/29/2020   Procedure: APPLICATION OF WOUND VAC;  Surgeon: RRonny Bacon MD;  Location: [facility] ORS;  Service: General;  Laterality: Right;  . BREAST BIOPSY Right 11/06/2019   u[place]oreacore path pending venus clip  . HERNIA REPAIR Left   . INCISION AND DRAINAGE ABSCESS N/A 01/26/2018   Procedure: INCISION AND DEBRIDEMENT OF VULVAR NECROTIZING SOFT TISSUE INFECTION;  Surgeon: WGreer Pickerel MD;  [place]  Service: General;  Laterality: N/A;  . INCISION AND DRAINAGE PERIRECTAL ABSCESS N/A 01/22/2018   Procedure: IRRIGATION AND DEBRIDEMENT LABIAL/VULVAR AREA;  Surgeon: BCoralie Keens MD;  [place] [facility]  Service: General;  Laterality: N/A;  . INCISION AND DRAINAGE PERIRECTAL ABSCESS N/A 01/29/2018   Procedure: IRRIGATION AND DEBRIDEMENT VULVA;  Surgeon: HExcell Seltzer MD;  [place]  Service: General;  Laterality: N/A;  . INGUINAL HERNIA REPAIR Left 04/08/2015   Procedure: OPEN LEFT INGUINAL HERNIA REPAIR WITH MESH;  Surgeon: ARalene Ok MD;  [place]  Service: General;  Laterality: Left;  . INSERTION OF MESH Left 04/08/2015   Procedure: INSERTION OF MESH;  Surgeon: ARalene Ok MD;  [place]  Service: General;  Laterality: Left;  . LAPAROSCOPY     Endometriosis  . LEFT HEART CATHETERIZATION WITH CORONARY ANGIOGRAM N/A 12/05/2013   Procedure: LEFT HEART CATHETERIZATION WITH CORONARY ANGIOGRAM;  Surgeon: JJettie Booze MD;  [facility];  Service: Cardiovascular;  Laterality: N/A;  . MASTECTOMY Right   . right kidney drained    . SIMPLE MASTECTOMY WITH AXILLARY SENTINEL NODE BIOPSY Right 12/23/2019   Procedure: Right SIMPLE MASTECTOMY WITH AXILLARY SENTINEL NODE BIOPSY;  Surgeon: RRonny Bacon MD;  Location: [facility];  Service: General;  Laterality: Right;  . TEE WITHOUT CARDIOVERSION N/A 11/28/2013   Procedure: TRANSESOPHAGEAL ECHOCARDIOGRAM (TEE);  Surgeon: PThayer Headings MD;  [facility] [place]  Service: Cardiovascular;  Laterality: N/A;  . WOUND DEBRIDEMENT Right 01/29/2020   Procedure: DEBRIDEMENT WOUND, Excisional debridement skin, subcutaneous and muscle right chest wall;  Surgeon: RRonny Bacon MD;  Location: [facility];  Service: General;  Laterality: Right;    SOCIAL HISTORY: Social History   Socioeconomic History  . Marital status: Married    Spouse name: Not on file  . Number of children: 2  . Years of education: Not on file  . Highest education level: Not on file  Occupational History  .  Not on file  Tobacco Use  . Smoking status: Some Days    Packs/day: 0.25    Years: 36.00    Pack years: 9.00    Types: Cigarettes    Start date: 07/11/1981  . Smokeless tobacco: Never  . Tobacco comments:    1 ppd now  Vaping Use  . Vaping Use: Some days  Substance and Sexual Activity  . Alcohol use: No  . Drug use: No  . Sexual activity: Not Currently  Other Topics Concern  . Not on file  Social History Narrative   ** Merged History Encounter **       Lives in [place]   Married but lives with Boyfriend - not legally separated   Disabled - for BiPolar, Seizure disorder, diabetes   Formerly worked at [employer] 1 ppd; no alcohol. 2 step sons; no biologic children.       Social Determinants of Health   Financial Resource Strain: Not on file  Food Insecurity: Not on file  Transportation Needs: Not on file  Physical Activity: Not on file  Stress: Not on file  Social Connections: Not on file  Intimate Partner Violence: Not on file    FAMILY HISTORY: Family History  Problem Relation Age of Onset  . Venous thrombosis Brother   . Other Brother        BRAIN TUMOR  . Asthma Father   . Diabetes Father   . Coronary artery disease Mother   . Hypertension Mother   . Diabetes Mother   . Breast cancer Mother 34  . Asthma Sister   . Diabetes type II Brother   . Diabetes Brother     ALLERGIES:  is allergic to adhesive [tape], metoprolol, montelukast, morphine sulfate, penicillins, diltiazem, gabapentin, and midodrine.  MEDICATIONS:  Current Outpatient Medications  Medication Sig Dispense Refill  . ALPRAZolam (XANAX) 1 MG tablet Take 1 mg by mouth 4 (four) times daily as needed for anxiety.     Marland Kitchen anastrozole (ARIMIDEX) 1 MG tablet Take 1 tablet (1 mg total) by mouth daily. 60 tablet 2  . aspirin EC 81 MG tablet Take 81 mg by mouth daily before breakfast.     . budesonide-formoterol (SYMBICORT) 80-4.5 MCG/ACT inhaler INHALE 2 PUFFS BY MOUTH EVERY 12 HOURS TO PREVENT COUGH OR WHEEZING *RINSE MOUTH AFTER EACH USE* 10.2 g 2  . busPIRone (BUSPAR) 30 MG tablet Take 30 mg by mouth 2 (two) times daily.    . citalopram (CELEXA) 20 MG tablet Take 1 tablet (20 mg total) by mouth daily. 30 tablet 2  . cyclobenzaprine (FLEXERIL) 10 MG tablet Take 1 tablet (10 mg total) by mouth 2 (two) times daily as needed for muscle spasms. 14 tablet 0  . desvenlafaxine (PRISTIQ) 100 MG 24 hr tablet Take 100 mg by mouth daily.    Marland Kitchen Desvenlafaxine ER 100 MG TB24 Take 1 tablet by mouth every morning.    . diclofenac Sodium (VOLTAREN) 1 % GEL Apply 2 g topically 3 (three) times daily as needed. For pain. 100 g 0   . Empagliflozin-metFORMIN HCl ER 25-1000 MG TB24 Take 1 tablet by mouth daily with breakfast.    . famotidine (PEPCID) 20 MG tablet TAKE 1 TABLET (20 MG TOTAL) BY MOUTH 2 (TWO) TIMES DAILY. FOR HEARTBURN. (Patient taking differently: Take 20 mg by mouth 2 (two) times daily.) 180 tablet 1  . FLUoxetine (PROZAC) 40 MG capsule Take 40 mg by mouth daily.    Marland Kitchen  insulin regular human CONCENTRATED (HUMULIN R) 500 UNIT/ML injection Inject 30 Units into the skin 3 (three) times daily with meals.     . Insulin Syringe-Needle U-100 (GLOBAL INJECT EASE INSULIN SYR) 30G X 1/2" 1 ML MISC See admin instructions.    . meclizine (ANTIVERT) 12.5 MG tablet Take 1 tablet (12.5 mg total) by mouth 2 (two) times daily as needed for dizziness. 30 tablet 0  . mupirocin ointment (BACTROBAN) 2 % Apply 1 application topically 2 (two) times daily. Apply to the affected area 2 times a day 22 g 3  . nystatin-triamcinolone ointment (MYCOLOG) Apply 1 application topically 2 (two) times daily. 30 g 1  . OXYGEN Inhale 2-4 L into the lungs 2 (two) times daily as needed (shortness of breath).     . pregabalin (LYRICA) 300 MG capsule Take 1 capsule (300 mg total) by mouth 2 (two) times daily. For nerve pain. 180 capsule 0  . rOPINIRole (REQUIP) 1 MG tablet TAKE 1 TABLET BY MOUTH EVERY NIGHT AT BEDTIME FOR RESTLESS LEGS. 90 tablet 1  . rosuvastatin (CRESTOR) 20 MG tablet Take 1 tablet (20 mg total) by mouth daily. For cholesterol. 90 tablet 3  . spironolactone (ALDACTONE) 50 MG tablet TAKE 1 TABLET BY MOUTH ONCE A DAY 90 tablet 1  . torsemide (DEMADEX) 20 MG tablet Take 2 tablets (40 mg total) by mouth 2 (two) times daily. 1 tablet 0  . traZODone (DESYREL) 100 MG tablet Take 200 mg by mouth at bedtime.     Marland Kitchen ULTICARE MINI PEN NEEDLES 31G X 6 MM MISC USE AS DIRECTED 3 TIMES DAILY 100 each 0  . acetaminophen (TYLENOL) 500 MG tablet Take 1,000 mg by mouth every 6 (six) hours as needed for moderate pain.  (Patient not taking: Reported on  12/23/2020)    . albuterol (VENTOLIN HFA) 108 (90 Base) MCG/ACT inhaler Inhale 2 puffs into the lungs every 6 (six) hours as needed for wheezing or shortness of breath. (Patient not taking: Reported on 12/23/2020)    . butalbital-acetaminophen-caffeine (FIORICET) 50-325-40 MG tablet Take 1-2 tablets by mouth every 6 (six) hours as needed for headache. (Patient not taking: Reported on 12/23/2020) 20 tablet 0  . glucose blood test strip Use as instructed 300 each 0  . Insulin Pen Needle 32G X 4 MM MISC Used to give insulin injections twice daily. (Patient not taking: Reported on 12/23/2020) 100 each 11  . ipratropium-albuterol (DUONEB) 0.5-2.5 (3) MG/3ML SOLN Take 3 mLs by nebulization every 4 (four) hours as needed (wheezing/shortness of breath). (Patient not taking: Reported on 12/23/2020) 360 mL 0  . ReliOn Ultra Thin Lancets 30G MISC Use as directed for blood sugar checks. 300 each 0   No current facility-administered medications for this visit.      Marland Kitchen  PHYSICAL EXAMINATION: ECOG PERFORMANCE STATUS: 1 - Symptomatic but completely ambulatory  Vitals:   12/23/20 1443  BP: 118/71  Pulse: 70  Temp: 99.1 F (37.3 C)  SpO2: 95%   Filed Weights   12/23/20 1443  Weight: 249 lb 11.2 oz (113.3 kg)    Physical Exam Constitutional:      Comments: Accompanied by husband.  Ambulating independently.  HENT:     Head: Normocephalic and atraumatic.     Mouth/Throat:     Pharynx: No oropharyngeal exudate.  Eyes:     Pupils: Pupils are equal, round, and reactive to light.  Cardiovascular:     Rate and Rhythm: Normal rate and regular rhythm.  Pulmonary:  Effort: No respiratory distress.     Breath sounds: No wheezing.     Comments: Decreased breath sounds bilaterally the bases. Abdominal:     General: Bowel sounds are normal. There is no distension.     Palpations: Abdomen is soft. There is no mass.     Tenderness: There is no abdominal tenderness. There is no guarding or rebound.   Musculoskeletal:        General: No tenderness. Normal range of motion.     Cervical back: Normal range of motion and neck supple.  Skin:    General: Skin is warm.     Comments: Right mastectomy incision-well-healed.  Left breast-behind the nipple upper outer-awaiting 2 cm mass noted.  Mobile.  No skin changes.  No nipple changes or discharge noted.  Neurological:     Mental Status: She is alert and oriented to person, place, and time.  Psychiatric:        Mood and Affect: Affect normal.     LABORATORY DATA:  I have reviewed the data as listed Lab Results  Component Value Date   WBC 8.8 12/23/2020   HGB 12.6 12/23/2020   HCT 38.5 12/23/2020   MCV 85.6 12/23/2020   PLT 174 12/23/2020   Recent Labs    01/28/20 0911 02/25/20 1240 02/25/20 1504 05/15/20 1312 05/17/20 0307 05/17/20 2336 10/14/20 0938 11/05/20 1232 12/01/20 1250 12/23/20 1409  NA 132* 128* 129*   < >  --    < > 135 138 136 138  K 3.8 4.3 4.2   < >  --    < > 4.0 3.3* 4.0 3.7  CL 90* 89* 88*   < >  --    < > 97* 101 97 100  CO2 29 26 28    < >  --    < > 28 27 32 29  GLUCOSE 395* 623* 617*   < >  --    < > 343* 270* 183* 82  BUN 35* 43* 40*   < >  --    < > 25* 20 31* 25*  CREATININE 1.42* 1.29* 1.39*   < >  --    < > 1.04* 0.88 1.20 1.05*  CALCIUM 8.6* 8.8* 8.9   < >  --    < > 9.2 9.0 9.8 9.5  GFRNONAA 43* 48* 44*  --   --    < > >60 >60  --  >60  GFRAA 49* 56* 51*  --   --   --   --   --   --   --   PROT 8.6*  --  9.1*   < > 8.2*   < > 9.0* 8.0  --  8.9*  ALBUMIN 3.5  --  3.5   < > 3.3*   < > 3.9 3.6  --  4.0  AST 16  --  20   < > 21   < > 20 19  --  21  ALT 19  --  20   < > 21   < > 19 19  --  18  ALKPHOS 124  --  186*   < > 130*   < > 144* 121  --  118  BILITOT 1.3*  --  1.2   < > 1.0   < > 0.7 1.1  --  1.1  BILIDIR  --   --   --   --  0.2  --   --   --   --   --  IBILI  --   --   --   --  0.8  --   --   --   --   --    < > = values in this interval not displayed.       RADIOGRAPHIC  STUDIES: I have personally reviewed the radiological images as listed and agreed with the findings in the report. No results found.   ASSESSMENT & PLAN:   Carcinoma of lower-outer quadrant of right breast in female, estrogen receptor positive ([place]) # T2N0- ER/PR-positive; premenopausal currently on Anastrazole [stopped tam sec to poor tolerance].  Previously Lupron declined by insurance.  Patient noncompliant with appointments with gynecology for oophorectomy.  Given the change/new insurance-we will appeal for Lupron again.  #Continue anastrozole for now; Lupron [place]-3 month: IM: 11.25 mg every 3 months.   # Migraines- awaiting neurology evaluation   #Depression/anxiety/bipolar-currently on BuSpar/Celexa/trazodone-stable-As per psychiatry.  #Chronic respiratory failure-CHF/COPD-STABLE.   #Poorly controlled diabetes-on insulin; AM- 343;  followed endocrinology.  #Peripheral neuropathy grade 2-3; patient goes on with a walker/cane at baseline- STABLE.    #Followed by [facility]ospice-recommend give phone number-discussed patient's care.  DISPOSITION: # follow up in late July, 2022- MD; labs- cbc/cmp/ca-27-29; Lupron-Dr.B    All questions were answered. The patient knows to call the clinic with any problems, questions or concerns.    Cammie Sickle, MD 01/02/2021 11:11 PM

## 2020-12-24 LAB — CANCER ANTIGEN 27.29: CA 27.29: 26.9 U/mL (ref 0.0–38.6)

## 2020-12-25 ENCOUNTER — Telehealth: Payer: Self-pay

## 2020-12-25 DIAGNOSIS — E1165 Type 2 diabetes mellitus with hyperglycemia: Secondary | ICD-10-CM

## 2020-12-25 MED ORDER — RELION ULTRA THIN LANCETS 30G MISC: 0 refills | Status: DC

## 2020-12-25 MED ORDER — GLUCOSE BLOOD VI STRP
ORAL_STRIP | 0 refills | Status: DC
Start: 2020-12-25 — End: 2021-12-12

## 2020-12-25 NOTE — Telephone Encounter (Signed)
Please notify [name]:  I have never told patient that she has colon cancer, I also searched through her chart over the last 2 years and do not see evidence of a positive stool card.  I also do not see a colonoscopy or Cologuard report.  Maybe she had this done at another office?  2.  I could not find true draw lancet strips in our system, but said something similar.  True Metrix test strips sent to the pharmacy

## 2020-12-25 NOTE — Telephone Encounter (Signed)
Anna nurse with [facility] said that she has 2 issues for pt today.  1) pt understood that had stool test at [facility] that showed + for blood and pt was told she had colon CA. [name] wants to verify this info is correct and request cb.  2) Endo is not available and pt is out of True metrix diabetic test strips and True draw lancets and request refill to [facility] said to call pt when done. Sending note to Gentry Fitz NP and [facility] CMA.

## 2020-12-28 NOTE — Telephone Encounter (Signed)
Left message to return call to our office.  

## 2020-12-29 NOTE — Telephone Encounter (Signed)
Received fax from pharmacy how many times a day do you want her to check?

## 2020-12-29 NOTE — Telephone Encounter (Signed)
She follows with endocrinology, see below. My guess is 3-4 times daily?

## 2021-01-01 DIAGNOSIS — M545 Low back pain, unspecified: Secondary | ICD-10-CM | POA: Diagnosis not present

## 2021-01-02 ENCOUNTER — Encounter: Payer: Self-pay | Admitting: Internal Medicine

## 2021-01-04 NOTE — Telephone Encounter (Signed)
Patient only checks 2 times a day sometimes 3. She also stated you set her up for therapy for her hand and she couldn't find it she requested somewhere in [place]. Please Advise.

## 2021-01-05 NOTE — Telephone Encounter (Signed)
Will send to Referrals inbasket as I am not in Referrals today  Anastasiya, please advise

## 2021-01-05 NOTE — Telephone Encounter (Signed)
She was referred to occupational therapy in May 2022, looks like they called her and there was no response.   [name], do you have the contact information for this place for which she was referred?

## 2021-01-06 NOTE — Telephone Encounter (Signed)
Spoke with patient and she would like to go to [place] location. Referral sent to [facility] main rehab office and patient was given that information to call and schedule

## 2021-01-07 DIAGNOSIS — I130 Hypertensive heart and chronic kidney disease with heart failure and stage 1 through stage 4 chronic kidney disease, or unspecified chronic kidney disease: Secondary | ICD-10-CM | POA: Diagnosis not present

## 2021-01-07 DIAGNOSIS — E1165 Type 2 diabetes mellitus with hyperglycemia: Secondary | ICD-10-CM | POA: Diagnosis not present

## 2021-01-07 DIAGNOSIS — I5032 Chronic diastolic (congestive) heart failure: Secondary | ICD-10-CM | POA: Diagnosis not present

## 2021-01-12 ENCOUNTER — Telehealth: Payer: Self-pay

## 2021-01-12 ENCOUNTER — Encounter: Payer: Self-pay | Admitting: Internal Medicine

## 2021-01-12 NOTE — Telephone Encounter (Signed)
Called patient she can not come in until Friday. Have put her in 20 min with note to not open the 320. She will call if any changes. Aware to drink water so that she is able to give urine sample.

## 2021-01-12 NOTE — Telephone Encounter (Signed)
Noted, will evaluate. 

## 2021-01-12 NOTE — Telephone Encounter (Signed)
Voice mail received from [facility] with Nurse care. States patient is having feeling of urgency with urination. Denies any pain. Feels like she is emptying bladder completely with no issues each time. She has been taking lasix daily as directed. Has been monitoring weight daily up one pound from last weight on Friday.   She has reached out to urology but was told that office is closed all week and see if PCP can address.    [phone]

## 2021-01-12 NOTE — Telephone Encounter (Signed)
Can we get her in this week to see [place]?

## 2021-01-13 ENCOUNTER — Ambulatory Visit
Admission: EM | Admit: 2021-01-13 | Discharge: 2021-01-13 | Disposition: A | Discharge status: Home / Self Care | Payer: Medicare Other

## 2021-01-13 ENCOUNTER — Other Ambulatory Visit: Payer: Self-pay

## 2021-01-13 ENCOUNTER — Encounter: Payer: Self-pay | Admitting: Emergency Medicine

## 2021-01-13 ENCOUNTER — Encounter: Payer: Self-pay | Admitting: Internal Medicine

## 2021-01-13 ENCOUNTER — Inpatient Hospital Stay
Admission: EM | Admit: 2021-01-13 | Discharge: 2021-01-17 | DRG: 603 | Disposition: A | Discharge status: Home / Self Care | Payer: BC Managed Care – PPO | Attending: Internal Medicine | Admitting: Internal Medicine

## 2021-01-13 ENCOUNTER — Emergency Department: Payer: BC Managed Care – PPO

## 2021-01-13 ENCOUNTER — Telehealth: Payer: Self-pay

## 2021-01-13 DIAGNOSIS — J9611 Chronic respiratory failure with hypoxia: Secondary | ICD-10-CM | POA: Diagnosis present

## 2021-01-13 DIAGNOSIS — I2510 Atherosclerotic heart disease of native coronary artery without angina pectoris: Secondary | ICD-10-CM | POA: Diagnosis present

## 2021-01-13 DIAGNOSIS — J449 Chronic obstructive pulmonary disease, unspecified: Secondary | ICD-10-CM | POA: Diagnosis present

## 2021-01-13 DIAGNOSIS — L089 Local infection of the skin and subcutaneous tissue, unspecified: Secondary | ICD-10-CM

## 2021-01-13 DIAGNOSIS — Z9981 Dependence on supplemental oxygen: Secondary | ICD-10-CM

## 2021-01-13 DIAGNOSIS — Z833 Family history of diabetes mellitus: Secondary | ICD-10-CM

## 2021-01-13 DIAGNOSIS — F17210 Nicotine dependence, cigarettes, uncomplicated: Secondary | ICD-10-CM | POA: Diagnosis present

## 2021-01-13 DIAGNOSIS — E1169 Type 2 diabetes mellitus with other specified complication: Secondary | ICD-10-CM | POA: Diagnosis not present

## 2021-01-13 DIAGNOSIS — Z880 Allergy status to penicillin: Secondary | ICD-10-CM

## 2021-01-13 DIAGNOSIS — Z79899 Other long term (current) drug therapy: Secondary | ICD-10-CM

## 2021-01-13 DIAGNOSIS — E119 Type 2 diabetes mellitus without complications: Secondary | ICD-10-CM

## 2021-01-13 DIAGNOSIS — G4733 Obstructive sleep apnea (adult) (pediatric): Secondary | ICD-10-CM | POA: Diagnosis present

## 2021-01-13 DIAGNOSIS — L97429 Non-pressure chronic ulcer of left heel and midfoot with unspecified severity: Secondary | ICD-10-CM | POA: Diagnosis present

## 2021-01-13 DIAGNOSIS — E1122 Type 2 diabetes mellitus with diabetic chronic kidney disease: Secondary | ICD-10-CM | POA: Diagnosis not present

## 2021-01-13 DIAGNOSIS — I152 Hypertension secondary to endocrine disorders: Secondary | ICD-10-CM | POA: Diagnosis present

## 2021-01-13 DIAGNOSIS — F319 Bipolar disorder, unspecified: Secondary | ICD-10-CM | POA: Diagnosis not present

## 2021-01-13 DIAGNOSIS — G2581 Restless legs syndrome: Secondary | ICD-10-CM | POA: Diagnosis not present

## 2021-01-13 DIAGNOSIS — M7989 Other specified soft tissue disorders: Secondary | ICD-10-CM | POA: Diagnosis not present

## 2021-01-13 DIAGNOSIS — L03116 Cellulitis of left lower limb: Principal | ICD-10-CM | POA: Diagnosis present

## 2021-01-13 DIAGNOSIS — Z20822 Contact with and (suspected) exposure to covid-19: Secondary | ICD-10-CM | POA: Diagnosis not present

## 2021-01-13 DIAGNOSIS — Z794 Long term (current) use of insulin: Secondary | ICD-10-CM

## 2021-01-13 DIAGNOSIS — E1165 Type 2 diabetes mellitus with hyperglycemia: Secondary | ICD-10-CM

## 2021-01-13 DIAGNOSIS — G894 Chronic pain syndrome: Secondary | ICD-10-CM | POA: Diagnosis present

## 2021-01-13 DIAGNOSIS — E11621 Type 2 diabetes mellitus with foot ulcer: Secondary | ICD-10-CM | POA: Diagnosis present

## 2021-01-13 DIAGNOSIS — I5032 Chronic diastolic (congestive) heart failure: Secondary | ICD-10-CM | POA: Diagnosis not present

## 2021-01-13 DIAGNOSIS — I69354 Hemiplegia and hemiparesis following cerebral infarction affecting left non-dominant side: Secondary | ICD-10-CM

## 2021-01-13 DIAGNOSIS — G4730 Sleep apnea, unspecified: Secondary | ICD-10-CM | POA: Diagnosis present

## 2021-01-13 DIAGNOSIS — E1151 Type 2 diabetes mellitus with diabetic peripheral angiopathy without gangrene: Secondary | ICD-10-CM | POA: Diagnosis not present

## 2021-01-13 DIAGNOSIS — Z888 Allergy status to other drugs, medicaments and biological substances status: Secondary | ICD-10-CM

## 2021-01-13 DIAGNOSIS — E781 Pure hyperglyceridemia: Secondary | ICD-10-CM | POA: Diagnosis present

## 2021-01-13 DIAGNOSIS — E785 Hyperlipidemia, unspecified: Secondary | ICD-10-CM | POA: Diagnosis present

## 2021-01-13 DIAGNOSIS — K219 Gastro-esophageal reflux disease without esophagitis: Secondary | ICD-10-CM | POA: Diagnosis present

## 2021-01-13 DIAGNOSIS — Z803 Family history of malignant neoplasm of breast: Secondary | ICD-10-CM

## 2021-01-13 DIAGNOSIS — E1142 Type 2 diabetes mellitus with diabetic polyneuropathy: Secondary | ICD-10-CM | POA: Diagnosis not present

## 2021-01-13 DIAGNOSIS — N182 Chronic kidney disease, stage 2 (mild): Secondary | ICD-10-CM | POA: Diagnosis present

## 2021-01-13 DIAGNOSIS — I130 Hypertensive heart and chronic kidney disease with heart failure and stage 1 through stage 4 chronic kidney disease, or unspecified chronic kidney disease: Secondary | ICD-10-CM | POA: Diagnosis present

## 2021-01-13 DIAGNOSIS — Z66 Do not resuscitate: Secondary | ICD-10-CM | POA: Diagnosis not present

## 2021-01-13 DIAGNOSIS — I5030 Unspecified diastolic (congestive) heart failure: Secondary | ICD-10-CM | POA: Diagnosis present

## 2021-01-13 DIAGNOSIS — N179 Acute kidney failure, unspecified: Secondary | ICD-10-CM | POA: Diagnosis not present

## 2021-01-13 DIAGNOSIS — Z7951 Long term (current) use of inhaled steroids: Secondary | ICD-10-CM

## 2021-01-13 DIAGNOSIS — S90822A Blister (nonthermal), left foot, initial encounter: Secondary | ICD-10-CM

## 2021-01-13 DIAGNOSIS — F419 Anxiety disorder, unspecified: Secondary | ICD-10-CM | POA: Diagnosis present

## 2021-01-13 DIAGNOSIS — Z853 Personal history of malignant neoplasm of breast: Secondary | ICD-10-CM

## 2021-01-13 DIAGNOSIS — Z8249 Family history of ischemic heart disease and other diseases of the circulatory system: Secondary | ICD-10-CM

## 2021-01-13 DIAGNOSIS — L03119 Cellulitis of unspecified part of limb: Secondary | ICD-10-CM | POA: Diagnosis present

## 2021-01-13 DIAGNOSIS — Z825 Family history of asthma and other chronic lower respiratory diseases: Secondary | ICD-10-CM

## 2021-01-13 DIAGNOSIS — Z9011 Acquired absence of right breast and nipple: Secondary | ICD-10-CM

## 2021-01-13 LAB — COMPREHENSIVE METABOLIC PANEL
ALT: 20 U/L (ref 0–44)
AST: 22 U/L (ref 15–41)
Albumin: 3.5 g/dL (ref 3.5–5.0)
Alkaline Phosphatase: 142 U/L — ABNORMAL HIGH (ref 38–126)
Anion gap: 12 (ref 5–15)
BUN: 23 mg/dL — ABNORMAL HIGH (ref 6–20)
CO2: 30 mmol/L (ref 22–32)
Calcium: 9 mg/dL (ref 8.9–10.3)
Chloride: 92 mmol/L — ABNORMAL LOW (ref 98–111)
Creatinine, Ser: 1.14 mg/dL — ABNORMAL HIGH (ref 0.44–1.00)
GFR, Estimated: 58 mL/min — ABNORMAL LOW (ref >60)
Glucose, Bld: 194 mg/dL — ABNORMAL HIGH (ref 70–99)
Potassium: 3.6 mmol/L (ref 3.5–5.1)
Sodium: 134 mmol/L — ABNORMAL LOW (ref 135–145)
Total Bilirubin: 0.9 mg/dL (ref 0.3–1.2)
Total Protein: 8.1 g/dL (ref 6.5–8.1)

## 2021-01-13 LAB — CBC WITH DIFFERENTIAL/PLATELET
Abs Immature Granulocytes: 0.04 10*3/uL (ref 0.00–0.07)
Basophils Absolute: 0.1 10*3/uL (ref 0.0–0.1)
Basophils Relative: 1 %
Eosinophils Absolute: 0.4 10*3/uL (ref 0.0–0.5)
Eosinophils Relative: 4 %
HCT: 37.8 % (ref 36.0–46.0)
Hemoglobin: 12.4 g/dL (ref 12.0–15.0)
Immature Granulocytes: 0 %
Lymphocytes Relative: 14 %
Lymphs Abs: 1.4 10*3/uL (ref 0.7–4.0)
MCH: 27.7 pg (ref 26.0–34.0)
MCHC: 32.8 g/dL (ref 30.0–36.0)
MCV: 84.6 fL (ref 80.0–100.0)
Monocytes Absolute: 0.6 10*3/uL (ref 0.1–1.0)
Monocytes Relative: 6 %
Neutro Abs: 7.7 10*3/uL (ref 1.7–7.7)
Neutrophils Relative %: 75 %
Platelets: 155 10*3/uL (ref 150–400)
RBC: 4.47 MIL/uL (ref 3.87–5.11)
RDW: 16.1 % — ABNORMAL HIGH (ref 11.5–15.5)
WBC: 10.2 10*3/uL (ref 4.0–10.5)
nRBC: 0 % (ref 0.0–0.2)

## 2021-01-13 LAB — LACTIC ACID, PLASMA: Lactic Acid, Venous: 1.1 mmol/L (ref 0.5–1.9)

## 2021-01-13 MED ORDER — ONDANSETRON HCL 4 MG/2ML IJ SOLN: 4.0000 mg | Freq: Four times a day (QID) | INTRAMUSCULAR | Status: DC | PRN

## 2021-01-13 MED ORDER — INSULIN ASPART 100 UNIT/ML IJ SOLN
0.0000 [IU] | Freq: Three times a day (TID) | INTRAMUSCULAR | Status: DC
  Administered 2021-01-14: 5 [IU] via SUBCUTANEOUS
  Filled 2021-01-13: qty 1

## 2021-01-13 MED ORDER — IPRATROPIUM-ALBUTEROL 0.5-2.5 (3) MG/3ML IN SOLN
3.0000 mL | RESPIRATORY_TRACT | Status: DC | PRN
  Administered 2021-01-15: 3 mL via RESPIRATORY_TRACT
  Filled 2021-01-13: qty 3

## 2021-01-13 MED ORDER — ENOXAPARIN SODIUM 60 MG/0.6ML IJ SOSY
55.0000 mg | PREFILLED_SYRINGE | INTRAMUSCULAR | Status: DC
  Administered 2021-01-13 – 2021-01-16 (×4): 55 mg via SUBCUTANEOUS
  Filled 2021-01-13 (×4): qty 0.6

## 2021-01-13 MED ORDER — ROSUVASTATIN CALCIUM 20 MG PO TABS
20.0000 mg | ORAL_TABLET | Freq: Every day | ORAL | Status: DC
  Administered 2021-01-14 – 2021-01-17 (×4): 20 mg via ORAL
  Filled 2021-01-13 (×2): qty 2
  Filled 2021-01-13 (×2): qty 1
  Filled 2021-01-13: qty 2
  Filled 2021-01-13 (×2): qty 1
  Filled 2021-01-13: qty 2

## 2021-01-13 MED ORDER — SODIUM CHLORIDE 0.9 % IV SOLN: 1.0000 g | Freq: Once | INTRAVENOUS | Status: DC

## 2021-01-13 MED ORDER — BUSPIRONE HCL 10 MG PO TABS
30.0000 mg | ORAL_TABLET | Freq: Two times a day (BID) | ORAL | Status: DC
  Administered 2021-01-14 – 2021-01-17 (×8): 30 mg via ORAL
  Filled 2021-01-13 (×8): qty 3

## 2021-01-13 MED ORDER — ROPINIROLE HCL 1 MG PO TABS
1.0000 mg | ORAL_TABLET | Freq: Every day | ORAL | Status: DC
  Administered 2021-01-14 – 2021-01-16 (×4): 1 mg via ORAL
  Filled 2021-01-13 (×4): qty 1

## 2021-01-13 MED ORDER — ACETAMINOPHEN 325 MG PO TABS
650.0000 mg | ORAL_TABLET | Freq: Four times a day (QID) | ORAL | Status: DC | PRN
  Administered 2021-01-14: 650 mg via ORAL
  Filled 2021-01-13 (×2): qty 2

## 2021-01-13 MED ORDER — ALPRAZOLAM 0.5 MG PO TABS
1.0000 mg | ORAL_TABLET | Freq: Four times a day (QID) | ORAL | Status: DC | PRN
  Administered 2021-01-14 – 2021-01-16 (×5): 1 mg via ORAL
  Filled 2021-01-13 (×5): qty 2

## 2021-01-13 MED ORDER — SPIRONOLACTONE 25 MG PO TABS
50.0000 mg | ORAL_TABLET | Freq: Every day | ORAL | Status: DC
  Administered 2021-01-14: 50 mg via ORAL
  Filled 2021-01-13: qty 2

## 2021-01-13 MED ORDER — ANASTROZOLE 1 MG PO TABS
1.0000 mg | ORAL_TABLET | Freq: Every day | ORAL | Status: DC
  Administered 2021-01-14 – 2021-01-17 (×4): 1 mg via ORAL
  Filled 2021-01-13 (×4): qty 1

## 2021-01-13 MED ORDER — VANCOMYCIN HCL IN DEXTROSE 1-5 GM/200ML-% IV SOLN
1000.0000 mg | Freq: Once | INTRAVENOUS | Status: AC
  Administered 2021-01-13: 1000 mg via INTRAVENOUS
  Filled 2021-01-13: qty 200

## 2021-01-13 MED ORDER — ALBUTEROL SULFATE (2.5 MG/3ML) 0.083% IN NEBU: 2.5000 mg | INHALATION_SOLUTION | Freq: Four times a day (QID) | RESPIRATORY_TRACT | Status: DC | PRN

## 2021-01-13 MED ORDER — PREGABALIN 75 MG PO CAPS
300.0000 mg | ORAL_CAPSULE | Freq: Two times a day (BID) | ORAL | Status: DC
  Administered 2021-01-14 – 2021-01-17 (×8): 300 mg via ORAL
  Filled 2021-01-13 (×8): qty 4

## 2021-01-13 MED ORDER — ACETAMINOPHEN 650 MG RE SUPP: 650.0000 mg | Freq: Four times a day (QID) | RECTAL | Status: DC | PRN

## 2021-01-13 MED ORDER — FAMOTIDINE 20 MG PO TABS
20.0000 mg | ORAL_TABLET | Freq: Two times a day (BID) | ORAL | Status: DC
  Administered 2021-01-14 – 2021-01-17 (×8): 20 mg via ORAL
  Filled 2021-01-13 (×8): qty 1

## 2021-01-13 MED ORDER — TRAZODONE HCL 100 MG PO TABS
200.0000 mg | ORAL_TABLET | Freq: Every day | ORAL | Status: DC
  Administered 2021-01-14 – 2021-01-16 (×4): 200 mg via ORAL
  Filled 2021-01-13 (×4): qty 2

## 2021-01-13 MED ORDER — CEFAZOLIN SODIUM-DEXTROSE 2-4 GM/100ML-% IV SOLN
2.0000 g | Freq: Three times a day (TID) | INTRAVENOUS | Status: DC
  Administered 2021-01-14 (×2): 2 g via INTRAVENOUS
  Filled 2021-01-13 (×4): qty 100

## 2021-01-13 MED ORDER — CITALOPRAM HYDROBROMIDE 20 MG PO TABS
20.0000 mg | ORAL_TABLET | Freq: Every day | ORAL | Status: DC
  Administered 2021-01-14 – 2021-01-17 (×4): 20 mg via ORAL
  Filled 2021-01-13 (×4): qty 1

## 2021-01-13 MED ORDER — IBUPROFEN 400 MG PO TABS
400.0000 mg | ORAL_TABLET | Freq: Once | ORAL | Status: AC | PRN
  Administered 2021-01-14: 400 mg via ORAL
  Filled 2021-01-13: qty 1

## 2021-01-13 MED ORDER — INSULIN REGULAR HUMAN (CONC) 500 UNIT/ML ~~LOC~~ SOLN
35.0000 [IU] | Freq: Two times a day (BID) | SUBCUTANEOUS | Status: DC
  Filled 2021-01-13: qty 20

## 2021-01-13 MED ORDER — TORSEMIDE 20 MG PO TABS
40.0000 mg | ORAL_TABLET | Freq: Two times a day (BID) | ORAL | Status: DC
  Administered 2021-01-14 (×2): 40 mg via ORAL
  Filled 2021-01-13 (×2): qty 2

## 2021-01-13 MED ORDER — MOMETASONE FURO-FORMOTEROL FUM 100-5 MCG/ACT IN AERO
2.0000 | INHALATION_SPRAY | Freq: Two times a day (BID) | RESPIRATORY_TRACT | Status: DC
  Administered 2021-01-14 – 2021-01-17 (×8): 2 via RESPIRATORY_TRACT
  Filled 2021-01-13 (×2): qty 8.8

## 2021-01-13 MED ORDER — SODIUM CHLORIDE 0.9 % IV SOLN
1.0000 g | Freq: Once | INTRAVENOUS | Status: AC
  Administered 2021-01-13: 1 g via INTRAVENOUS
  Filled 2021-01-13: qty 1

## 2021-01-13 MED ORDER — ONDANSETRON HCL 4 MG PO TABS: 4.0000 mg | ORAL_TABLET | Freq: Four times a day (QID) | ORAL | Status: DC | PRN

## 2021-01-13 MED ORDER — ASPIRIN EC 81 MG PO TBEC
81.0000 mg | DELAYED_RELEASE_TABLET | Freq: Every day | ORAL | Status: DC
  Administered 2021-01-14 – 2021-01-17 (×4): 81 mg via ORAL
  Filled 2021-01-13 (×4): qty 1

## 2021-01-13 MED ORDER — NICOTINE 21 MG/24HR TD PT24
21.0000 mg | MEDICATED_PATCH | Freq: Every day | TRANSDERMAL | Status: DC
  Administered 2021-01-13 – 2021-01-16 (×4): 21 mg via TRANSDERMAL
  Filled 2021-01-13 (×5): qty 1

## 2021-01-13 NOTE — Telephone Encounter (Signed)
Patient called stating she developed blister on her heel she noticed yesterday 01/12/21, her ankle and foot is swollen now and red, redness and swelling spreading up her leg.

## 2021-01-13 NOTE — ED Triage Notes (Signed)
Pt comes into the ED via POV c/o cellulitis to the left lower leg.  Pt was sent by UC for the extensive cellulitis as well as a "blistered" wound on the left heel.  Pt is diabetic and she states her CBG's have been up and down.  Pt in NAD at this time.

## 2021-01-13 NOTE — ED Notes (Signed)
Pt has redness and pain in left lower leg.  Sx began last night.  Pt also has a blister to left heel.  Hx diabetes.  No sob.  No chest pain.  md at bedside   Family with pt

## 2021-01-13 NOTE — ED Triage Notes (Signed)
Patient presents to Urgent Care with complaints of a blister located on left heel. Pt states she noted the blister last night with increased redness and swelling to left lower leg. She called her pcp who instructed her to come in for eval.   Denies fever.

## 2021-01-13 NOTE — ED Provider Notes (Signed)
[name]    CSN: [id number] Arrival date & time: 01/13/21  1231      History   Chief Complaint Chief Complaint  Patient presents with   Blister    Left heel      HPI [name] is a 50 y.o. female.  Patient presents with a large blister on her left heel with redness and swelling of her foot and lower leg.  She noticed this yesterday evening.  No known injury.  She denies fever, chills, drainage, or other symptoms.  Her medical history includes chronic pain syndrome, chronic low back pain, chronic lower extremity pain, chronic upper extremity pain, long-term opiate use, long-term benzodiazepine use, neurogenic pain, morbid obesity, diabetes, COPD, TIA, seizures, bipolar disorder, hypertension, CKD, CHF, history of DVT, breast cancer, tobacco abuse.  The history is provided by the patient and medical records.   Past Medical History:  Diagnosis Date   Anxiety    takes Prozac daily   Anxiety    Aortic valve calcification    Asthma    Advair and Spirva daily   Asthma    Bipolar disorder (HCC)    CAD (coronary artery disease)    a. LHC 11/2013 done for CP/fluid retention: mild disease in prox LAD, mild-mod disease in mRCA, EF 60% with normal LVEDP. b. Normal nuc 03/2016.   Cancer (HCC)    CHF (congestive heart failure) (HCC)    Chronic diastolic CHF (congestive heart failure) (HCC)    Chronic heart failure with preserved ejection fraction ([place]) 11/16/2018   CKD (chronic kidney disease), stage II    COPD (chronic obstructive pulmonary disease) (HCC)    a. nocturnal O2.   COPD (chronic obstructive pulmonary disease) (HCC)    Coronary artery disease    Decreased urine stream    Diabetes mellitus    Diabetes mellitus without complication (HCC)    Dyspnea    Family history of adverse reaction to anesthesia    mom gets nauseated   GERD (gastroesophageal reflux disease)    takes Pepcid daily   History of blood clots    left leg 3-10yrs ago   Hyperlipidemia     Hypertension    Hypertriglyceridemia    Inguinal hernia, left 01/2015   Muscle spasm    Open wound of genital labia    Peripheral neuropathy    RBBB    Seizures ([place])    Sepsis ([place]) 01/19/2018   Sinus tachycardia    a. persistent since 2009.   Smokers' cough ([place])    Stroke ([place]) 1989   left sided weakness   TIA (transient ischemic attack)    Tobacco abuse    Vulvar abscess 01/23/2018    Patient Active Problem List   Diagnosis Date Noted   Fatigue 12/01/2020   Acute on chronic diastolic heart failure ([place]) 11/16/2020   Chronic migraine without aura without status migrainosus, not intractable 10/01/2020   Dizziness 10/01/2020   Acute midline low back pain without sciatica 09/01/2020   Microscopic hematuria 07/01/2020   Pulmonary nodules 07/01/2020   Hav (hallux abducto valgus), unspecified laterality 06/11/2020   Venous ulcer of ankle, left ([place]) 05/15/2020   Contracture of hand joint 05/15/2020   Cellulitis and abscess of trunk 03/12/2020   Status post mastectomy, right 01/14/2020   Acute kidney injury superimposed on CKD [facility])    Breast cancer, right breast ([place]) 12/23/2019   Hyperlipidemia    Carcinoma of lower-outer quadrant of right breast in female, estrogen receptor positive ([place]) 11/12/2019  Restrictive lung disease 10/08/2019   Postmenopausal bleeding 08/30/2019   Carotid stenosis, asymptomatic, right 08/16/2019   Nausea and vomiting 07/15/2019   Cellulitis of left lower extremity 06/04/2019   Multiple wounds of skin 06/04/2019   Other injury of unspecified body region, initial encounter 06/04/2019   Left-sided weakness 09/13/2018   Weakness of left lower extremity 09/05/2018   Recurrent falls 09/05/2018   PAD (peripheral artery disease) ([place]) 08/27/2018   Chronic congestive heart failure ([place]) 08/02/2018   Vaginal itching 06/15/2018   Foot pain, bilateral 05/22/2018   Chronic upper extremity pain (Secondary Area of Pain) (Bilateral) 04/23/2018   Diabetic  polyneuropathy associated with type 2 diabetes mellitus ([place]) 04/23/2018   Long term prescription benzodiazepine use 04/23/2018   Neurogenic pain 04/23/2018   Vitamin D insufficiency 01/29/2018   Elevated C-reactive protein (CRP) 01/29/2018   Elevated sedimentation rate 01/29/2018   Pressure injury of skin 01/29/2018   Poorly controlled type 2 diabetes mellitus ([place]) 01/23/2018   DNR (do not resuscitate) 01/23/2018   CKD stage 3 due to type 2 diabetes mellitus ([place]) 01/23/2018   IBS (irritable bowel syndrome) 01/19/2018   Elevated serum hCG 01/19/2018   Chronic lower extremity pain (Primary Area of Pain) (Bilateral) 01/08/2018   Chronic low back pain [facility]) (Bilateral) w/ sciatica (Bilateral) 01/08/2018   Chronic pain syndrome 01/08/2018   Pharmacologic therapy 01/08/2018   Disorder of skeletal system 01/08/2018   Problems influencing health status 01/08/2018   Long term current use of opiate analgesic 01/08/2018   Restless leg syndrome 08/02/2017   Nocturnal hypoxemia 03/29/2017   Vision disturbance 02/28/2017   CKD (chronic kidney disease), stage II 02/28/2017   Visual loss, bilateral    Chronic respiratory failure with hypoxia (HCC)/ nocturnal 02 dep  10/28/2016   Upper airway cough syndrome 08/22/2016   Cigarette smoker 06/15/2016   Simple chronic bronchitis ([place])    Coronary artery disease involving native heart without angina pectoris 05/16/2016   Chronic diastolic CHF (congestive heart failure) ([place]) 11/04/2015   Orthopnea 11/03/2015   Bilateral leg edema    Diabetes ([place])    COPD exacerbation ([place]) 10/15/2015   Fracture of rib, closed 10/15/2015   Venous stasis of both lower extremities 03/29/2015   Preventative health care 02/04/2015   Essential hypertension 01/02/2015   Inguinal hernia 11/27/2014   Allergic rhinitis with postnasal drip 01/21/2014   Aortic valve disorders 04/18/2013   Sleep apnea 05/22/2012   Syncope and collapse 10/09/2011    Seizure disorder ([place]) 10/09/2011   Bipolar disorder ([place]) 10/09/2011   TIA (transient ischemic attack) 06/22/2011   Constipation 06/15/2011   HYPERTRIGLYCERIDEMIA 07/17/2007   Situational anxiety 05/30/2007   End stage COPD ([place]) 05/30/2007   Morbid (severe) obesity due to excess calories ([place]) 04/23/2007   Tobacco abuse 09/07/2006    Past Surgical History:  Procedure Laterality Date   APPLICATION OF WOUND VAC Right 01/29/2020   Procedure: APPLICATION OF WOUND VAC;  Surgeon: Ronny Bacon, MD;  Location: [facility];  Service: General;  Laterality: Right;   BREAST BIOPSY Right 11/06/2019   [place] core path pending venus clip   HERNIA REPAIR Left    INCISION AND DRAINAGE ABSCESS N/A 01/26/2018   Procedure: INCISION AND DEBRIDEMENT OF VULVAR NECROTIZING SOFT TISSUE INFECTION;  Surgeon: Greer Pickerel, MD;  Location: [place];  Service: General;  Laterality: N/A;   INCISION AND DRAINAGE PERIRECTAL ABSCESS N/A 01/22/2018   Procedure: IRRIGATION AND DEBRIDEMENT LABIAL/VULVAR AREA;  Surgeon: Coralie Keens, MD;  Location:  [place] [facility];  Service: General;  Laterality: N/A;   INCISION AND DRAINAGE PERIRECTAL ABSCESS N/A 01/29/2018   Procedure: IRRIGATION AND DEBRIDEMENT VULVA;  Surgeon: Excell Seltzer, MD;  Location: [place];  Service: General;  Laterality: N/A;   INGUINAL HERNIA REPAIR Left 04/08/2015   Procedure: OPEN LEFT INGUINAL HERNIA REPAIR WITH MESH;  Surgeon: Ralene Ok, MD;  Location: [place];  Service: General;  Laterality: Left;   INSERTION OF MESH Left 04/08/2015   Procedure: INSERTION OF MESH;  Surgeon: Ralene Ok, MD;  Location: [place];  Service: General;  Laterality: Left;   LAPAROSCOPY     Endometriosis   LEFT HEART CATHETERIZATION WITH CORONARY ANGIOGRAM N/A 12/05/2013   Procedure: LEFT HEART CATHETERIZATION WITH CORONARY ANGIOGRAM;  Surgeon: Jettie Booze, MD;  Location: [facility] CATH LAB;  Service: Cardiovascular;  Laterality: N/A;   MASTECTOMY Right    right kidney drained      SIMPLE MASTECTOMY WITH AXILLARY SENTINEL NODE BIOPSY Right 12/23/2019   Procedure: Right SIMPLE MASTECTOMY WITH AXILLARY SENTINEL NODE BIOPSY;  Surgeon: Ronny Bacon, MD;  Location: [facility];  Service: General;  Laterality: Right;   TEE WITHOUT CARDIOVERSION N/A 11/28/2013   Procedure: TRANSESOPHAGEAL ECHOCARDIOGRAM (TEE);  Surgeon: Thayer Headings, MD;  Location: [place];  Service: Cardiovascular;  Laterality: N/A;   WOUND DEBRIDEMENT Right 01/29/2020   Procedure: DEBRIDEMENT WOUND, Excisional debridement skin, subcutaneous and muscle right chest wall;  Surgeon: Ronny Bacon, MD;  Location: [facility];  Service: General;  Laterality: Right;    OB History   No obstetric history on file.      Home Medications    Prior to Admission medications   Medication Sig Start Date End Date Taking? Authorizing Provider  acetaminophen (TYLENOL) 500 MG tablet Take 1,000 mg by mouth every 6 (six) hours as needed for moderate pain.  Patient not taking: Reported on 12/23/2020    [provider]  albuterol (VENTOLIN HFA) 108 (90 Base) MCG/ACT inhaler Inhale 2 puffs into the lungs every 6 (six) hours as needed for wheezing or shortness of breath. Patient not taking: Reported on 12/23/2020    [provider]  ALPRAZolam Duanne Moron) 1 MG tablet Take 1 mg by mouth 4 (four) times daily as needed for anxiety.  12/02/19   [provider]  anastrozole (ARIMIDEX) 1 MG tablet Take 1 tablet (1 mg total) by mouth daily. 06/29/20   Cammie Sickle, MD  aspirin EC 81 MG tablet Take 81 mg by mouth daily before breakfast.     [provider]  budesonide-formoterol (SYMBICORT) 80-4.5 MCG/ACT inhaler INHALE 2 PUFFS BY MOUTH EVERY 12 HOURS TO PREVENT COUGH OR WHEEZING *RINSE MOUTH AFTER EACH USE* 12/01/20   Pleas Koch, NP  busPIRone (BUSPAR) 30 MG tablet Take 30 mg by mouth 2 (two) times daily.    [provider]  butalbital-acetaminophen-caffeine (FIORICET) 50-325-40  MG tablet Take 1-2 tablets by mouth every 6 (six) hours as needed for headache. Patient not taking: Reported on 12/23/2020 08/06/20 08/06/21  Jacquelin Hawking, NP  citalopram (CELEXA) 20 MG tablet Take 1 tablet (20 mg total) by mouth daily. 12/17/19   Cammie Sickle, MD  cyclobenzaprine (FLEXERIL) 10 MG tablet Take 1 tablet (10 mg total) by mouth 2 (two) times daily as needed for muscle spasms. 09/01/20   Pleas Koch, NP  desvenlafaxine (PRISTIQ) 100 MG 24 hr tablet Take 100 mg by mouth daily.    [provider]  Desvenlafaxine ER 100 MG TB24 Take 1  tablet by mouth every morning. 12/18/20   [provider]  diclofenac Sodium (VOLTAREN) 1 % GEL Apply 2 g topically 3 (three) times daily as needed. For pain. 09/01/20   Pleas Koch, NP  Empagliflozin-metFORMIN HCl ER 25-1000 MG TB24 Take 1 tablet by mouth daily with breakfast. 12/03/20 12/03/21  [provider]  famotidine (PEPCID) 20 MG tablet TAKE 1 TABLET (20 MG TOTAL) BY MOUTH 2 (TWO) TIMES DAILY. FOR HEARTBURN. Patient taking differently: Take 20 mg by mouth 2 (two) times daily. 06/05/19   Pleas Koch, NP  FLUoxetine (PROZAC) 40 MG capsule Take 40 mg by mouth daily. 10/27/20   [provider]  glucose blood test strip Use as instructed 12/25/20   Pleas Koch, NP  Insulin Pen Needle 32G X 4 MM MISC Used to give insulin injections twice daily. Patient not taking: Reported on 12/23/2020 08/23/17   Renato Shin, MD  insulin regular human CONCENTRATED (HUMULIN R) 500 UNIT/ML injection Inject 30 Units into the skin 3 (three) times daily with meals.     [provider]  Insulin Syringe-Needle U-100 (GLOBAL INJECT EASE INSULIN SYR) 30G X 1/2" 1 ML MISC See admin instructions. 05/22/20 05/22/21  [provider]  ipratropium-albuterol (DUONEB) 0.5-2.5 (3) MG/3ML SOLN Take 3 mLs by nebulization every 4 (four) hours as needed (wheezing/shortness of breath). Patient not taking:  Reported on 12/23/2020 08/02/17   Pleas Koch, NP  meclizine (ANTIVERT) 12.5 MG tablet Take 1 tablet (12.5 mg total) by mouth 2 (two) times daily as needed for dizziness. 10/01/20   Pleas Koch, NP  mupirocin ointment (BACTROBAN) 2 % Apply 1 application topically 2 (two) times daily. Apply to the affected area 2 times a day 09/29/20   Newt Minion, MD  nystatin-triamcinolone ointment [facility]) Apply 1 application topically 2 (two) times daily. 04/30/20   Ronny Bacon, MD  OXYGEN Inhale 2-4 L into the lungs 2 (two) times daily as needed (shortness of breath).     [provider]  pregabalin (LYRICA) 300 MG capsule Take 1 capsule (300 mg total) by mouth 2 (two) times daily. For nerve pain. 11/25/20   Pleas Koch, NP  ReliOn Ultra Thin Lancets 30G MISC Use as directed for blood sugar checks. 12/25/20   Pleas Koch, NP  rOPINIRole (REQUIP) 1 MG tablet TAKE 1 TABLET BY MOUTH EVERY NIGHT AT BEDTIME FOR RESTLESS LEGS. 06/08/20   Pleas Koch, NP  rosuvastatin (CRESTOR) 20 MG tablet Take 1 tablet (20 mg total) by mouth daily. For cholesterol. 08/18/20   Pleas Koch, NP  spironolactone (ALDACTONE) 50 MG tablet TAKE 1 TABLET BY MOUTH ONCE A DAY 04/17/20   Patwardhan, Manish J, MD  torsemide (DEMADEX) 20 MG tablet Take 2 tablets (40 mg total) by mouth 2 (two) times daily. 11/16/20   Venia Carbon, MD  traZODone (DESYREL) 100 MG tablet Take 200 mg by mouth at bedtime.  11/08/18   [provider]  ULTICARE MINI PEN NEEDLES 31G X 6 MM MISC USE AS DIRECTED 3 TIMES DAILY 05/18/20   Renato Shin, MD    Family History Family History  Problem Relation Age of Onset   Venous thrombosis Brother    Other Brother        BRAIN TUMOR   Asthma Father    Diabetes Father    Coronary artery disease Mother    Hypertension Mother    Diabetes Mother    Breast cancer Mother 37  Asthma Sister    Diabetes type II Brother    Diabetes Brother     Social  History Social History   Tobacco Use   Smoking status: Some Days    Packs/day: 0.25    Years: 36.00    Pack years: 9.00    Types: Cigarettes    Start date: 07/11/1981   Smokeless tobacco: Never   Tobacco comments:    1 ppd now  Vaping Use   Vaping Use: Some days  Substance Use Topics   Alcohol use: No   Drug use: No     Allergies   Adhesive [tape], Metoprolol, Montelukast, Morphine sulfate, Penicillins, Diltiazem, Gabapentin, and Midodrine   Review of Systems Review of Systems  Constitutional:  Negative for chills and fever.  Respiratory:  Negative for cough and shortness of breath.   Cardiovascular:  Negative for chest pain and palpitations.  Musculoskeletal:  Positive for arthralgias, gait problem and joint swelling.  Skin:  Positive for color change and wound.  Neurological:  Negative for weakness and numbness.  All other systems reviewed and are negative.   Physical Exam Triage Vital Signs ED Triage Vitals  Enc Vitals Group     BP      Pulse      Resp      Temp      Temp src      SpO2      Weight      Height      Head Circumference      Peak Flow      Pain Score      Pain Loc      Pain Edu?      Excl. in [place]?    No data found.  Updated Vital Signs BP 102/66 (BP Location: Left Arm)   Pulse 76   Temp 98.4 F (36.9 C) (Oral)   Resp 18   LMP  (LMP Unknown)   SpO2 94%   Visual Acuity Right Eye Distance:   Left Eye Distance:   Bilateral Distance:    Right Eye Near:   Left Eye Near:    Bilateral Near:     Physical Exam Vitals and nursing note reviewed.  Constitutional:      General: She is not in acute distress.    Appearance: She is well-developed. She is obese. She is not ill-appearing.  HENT:     Head: Normocephalic and atraumatic.     Mouth/Throat:     Mouth: Mucous membranes are moist.  Eyes:     Conjunctiva/sclera: Conjunctivae normal.  Cardiovascular:     Rate and Rhythm: Normal rate and regular rhythm.     Heart sounds: Normal  heart sounds.  Pulmonary:     Effort: Pulmonary effort is normal. No respiratory distress.     Breath sounds: Normal breath sounds.  Abdominal:     Palpations: Abdomen is soft.     Tenderness: There is no abdominal tenderness.  Musculoskeletal:        General: Swelling and tenderness present. No deformity. Normal range of motion.     Cervical back: Neck supple.  Skin:    General: Skin is warm and dry.     Capillary Refill: Capillary refill takes less than 2 seconds.     Findings: Erythema and lesion present.     Comments: Large blister on left heel with erythema from foot to upper shin.  Neurological:     General: No focal deficit present.     Mental  Status: She is alert and oriented to person, place, and time.     Sensory: No sensory deficit.     Motor: No weakness.     Gait: Gait abnormal.  Psychiatric:        Mood and Affect: Mood normal.        Behavior: Behavior normal.     UC Treatments / Results  Labs (all labs ordered are listed, but only abnormal results are displayed) Labs Reviewed - No data to display  EKG   Radiology No results found.  Procedures Procedures (including critical care time)  Medications Ordered in UC Medications - No data to display  Initial Impression / Assessment and Plan / UC Course  I have reviewed the triage vital signs and the nursing notes.  Pertinent labs & imaging results that were available during my care of the patient were reviewed by me and considered in my medical decision making (see chart for details).  Cellulitis of left lower leg, blister of left foot with infection.  Due to the patient's extensive cellulitis, I believe she needs a higher level of care.  She may need stat lab work and IV antibiotics.  Sending her to the ED for evaluation.  She is afebrile and vital signs are stable.  She is accompanied by her son but she is driving.  She states she is stable to drive herself to the ED.   Final Clinical Impressions(s) / UC  Diagnoses   Final diagnoses:  Cellulitis of left lower leg  Infected blister of left foot, initial encounter     Discharge Instructions      Go to the emergency department for evaluation of the extensive cellulitis in your leg and foot.     ED Prescriptions   None    PDMP not reviewed this encounter.   Sharion Balloon, NP 01/13/21 1306

## 2021-01-13 NOTE — Telephone Encounter (Signed)
Spoke to patient by telephone and was advised that she thinks that she has a blister on the bottom of her left foot. Patient stated that her foot and ankle are red and swollen. Patient stated that the redness is spreading up her leg. Patient denies a fever. Patient was advised that she should have someone look at her leg and there are not any appointments here at the office today. Patient stated that she agrees that someone should look at her foot today. Patient was given information on the [facility]. Patient stated that she will plan on heading over to the UC shortly.

## 2021-01-13 NOTE — ED Notes (Signed)
Patient is being discharged from the Urgent Care and sent to the Emergency Department via POV . Per Hall Busing, NP patient is in need of higher level of care due to extensive cellulitis. Patient is aware and verbalizes understanding of plan of care.  Vitals:   01/13/21 1246  BP: 102/66  Pulse: 76  Resp: 18  Temp: 98.4 F (36.9 C)  SpO2: 94%

## 2021-01-13 NOTE — Progress Notes (Signed)
PHARMACIST - PHYSICIAN COMMUNICATION  CONCERNING:  Enoxaparin (Lovenox) for DVT Prophylaxis   DESCRIPTION: Patient was prescribed enoxaprin 40mg  q24 hours for VTE prophylaxis.   Filed Weights   01/13/21 1500  Weight: 113.3 kg (249 lb 12.5 oz)    Body mass index is 42.87 kg/m.  Estimated Creatinine Clearance: 72 mL/min (A) (by C-G formula based on SCr of 1.14 mg/dL (H)).   Based on [place] patient is candidate for enoxaparin 0.5mg /kg TBW SQ every 24 hours based on BMI being >30.  RECOMMENDATION: Pharmacy has adjusted enoxaparin dose per [facility] policy.  Patient is now receiving enoxaparin 55 mg every 24 hours    Darnelle Bos, PharmD Clinical Pharmacist  01/13/2021 9:42 PM

## 2021-01-13 NOTE — H&P (Signed)
History and Physical    [name] ZOX:[id number] DOB: 1971-07-02 DOA: 01/13/2021  PCP: Pleas Koch, NP  Patient coming from: Home via urgent care  I have personally briefly reviewed patient's old medical records in [place]  Chief Complaint: Left heel ulcer and LLE erythema  HPI: [name] is a 50 y.o. female with medical history significant for HFpEF (EF 55-60%), COPD, chronic respiratory failure with hypoxia on nocturnal supplemental O2 via [place], IDT2DM, HTN, HLD, CAD, breast cancer, bipolar/anxiety/depression, RLS, OSA, and tobacco use who presented to the ED for evaluation of left lower extremity erythema and heel ulcer.  Patient states she first noticed a blister at her left heel earlier today along with redness and swelling of her left lower leg anteriorly from her left ankle tracking up towards the knee.  She denies any open wound, injury, discharge, or weeping from the skin.  She reports associated chills without subjective fevers or diaphoresis.  She has not had any chest pain, nausea, vomiting, abdominal pain, or dysuria.  She reports chronic shortness of breath related to her COPD.  She says she wears 3.5-4 L O2 via [place] when she is sleeping due to hypoxia when asleep.  She reports history of OSA but not using CPAP due to intolerance.  She is a current smoker of 1 pack/day.  ED Course:  Initial vitals showed BP 105/93, pulse 95, RR 17, temp 98.9 F, SPO2 92% on room air.  Labs show WBC 10.3, hemoglobin 12.4, platelets 155,000, sodium 134, potassium 3.6, bicarb 30, BUN 23, creatinine 1.14, serum glucose 194, AST 22, ALT 20, alk phos 142, total bilirubin 0.9, lactic acid 1.1.  SARS-CoV-2 PCR in process.  Left foot x-ray negative for evidence of fracture, dislocation, or osteomyelitis.  LLE venous ultrasound negative for evidence of DVT.  Patient was given IV vancomycin and cefepime.  The hospitalist service was consulted to admit for further evaluation and  management.  Review of Systems: All systems reviewed and are negative except as documented in history of present illness above.   Past Medical History:  Diagnosis Date   Anxiety    takes Prozac daily   Anxiety    Aortic valve calcification    Asthma    Advair and Spirva daily   Asthma    Bipolar disorder (HCC)    CAD (coronary artery disease)    a. LHC 11/2013 done for CP/fluid retention: mild disease in prox LAD, mild-mod disease in mRCA, EF 60% with normal LVEDP. b. Normal nuc 03/2016.   Cancer (HCC)    CHF (congestive heart failure) (HCC)    Chronic diastolic CHF (congestive heart failure) (HCC)    Chronic heart failure with preserved ejection fraction ([place]) 11/16/2018   CKD (chronic kidney disease), stage II    COPD (chronic obstructive pulmonary disease) (HCC)    a. nocturnal O2.   COPD (chronic obstructive pulmonary disease) (HCC)    Coronary artery disease    Decreased urine stream    Diabetes mellitus    Diabetes mellitus without complication (HCC)    Dyspnea    Family history of adverse reaction to anesthesia    mom gets nauseated   GERD (gastroesophageal reflux disease)    takes Pepcid daily   History of blood clots    left leg 3-51yr ago   Hyperlipidemia    Hypertension    Hypertriglyceridemia    Inguinal hernia, left 01/2015   Muscle spasm    Open wound of genital labia  Peripheral neuropathy    RBBB    Seizures ([place])    Sepsis ([place]) 01/19/2018   Sinus tachycardia    a. persistent since 2009.   Smokers' cough ([place])    Stroke [facility]) 1989   left sided weakness   TIA (transient ischemic attack)    Tobacco abuse    Vulvar abscess 01/23/2018    Past Surgical History:  Procedure Laterality Date   APPLICATION OF WOUND VAC Right 01/29/2020   Procedure: APPLICATION OF WOUND VAC;  Surgeon: Ronny Bacon, MD;  Location: [facility] ORS;  Service: General;  Laterality: Right;   BREAST BIOPSY Right 11/06/2019   [place] core path pending venus clip   HERNIA REPAIR Left     INCISION AND DRAINAGE ABSCESS N/A 01/26/2018   Procedure: INCISION AND DEBRIDEMENT OF VULVAR NECROTIZING SOFT TISSUE INFECTION;  Surgeon: Greer Pickerel, MD;  Location: [place];  Service: General;  Laterality: N/A;   INCISION AND DRAINAGE PERIRECTAL ABSCESS N/A 01/22/2018   Procedure: IRRIGATION AND DEBRIDEMENT LABIAL/VULVAR AREA;  Surgeon: Coralie Keens, MD;  Location: [place];  Service: General;  Laterality: N/A;   INCISION AND DRAINAGE PERIRECTAL ABSCESS N/A 01/29/2018   Procedure: IRRIGATION AND DEBRIDEMENT VULVA;  Surgeon: Excell Seltzer, MD;  Location: [place];  Service: General;  Laterality: N/A;   INGUINAL HERNIA REPAIR Left 04/08/2015   Procedure: OPEN LEFT INGUINAL HERNIA REPAIR WITH MESH;  Surgeon: Ralene Ok, MD;  Location: [place];  Service: General;  Laterality: Left;   INSERTION OF MESH Left 04/08/2015   Procedure: INSERTION OF MESH;  Surgeon: Ralene Ok, MD;  Location: [place];  Service: General;  Laterality: Left;   LAPAROSCOPY     Endometriosis   LEFT HEART CATHETERIZATION WITH CORONARY ANGIOGRAM N/A 12/05/2013   Procedure: LEFT HEART CATHETERIZATION WITH CORONARY ANGIOGRAM;  Surgeon: Jettie Booze, MD;  Location: [facility] CATH LAB;  Service: Cardiovascular;  Laterality: N/A;   MASTECTOMY Right    right kidney drained     SIMPLE MASTECTOMY WITH AXILLARY SENTINEL NODE BIOPSY Right 12/23/2019   Procedure: Right SIMPLE MASTECTOMY WITH AXILLARY SENTINEL NODE BIOPSY;  Surgeon: Ronny Bacon, MD;  Location: [facility];  Service: General;  Laterality: Right;   TEE WITHOUT CARDIOVERSION N/A 11/28/2013   Procedure: TRANSESOPHAGEAL ECHOCARDIOGRAM (TEE);  Surgeon: Thayer Headings, MD;  Location: [place];  Service: Cardiovascular;  Laterality: N/A;   WOUND DEBRIDEMENT Right 01/29/2020   Procedure: DEBRIDEMENT WOUND, Excisional debridement skin, subcutaneous and muscle right chest wall;  Surgeon: Ronny Bacon, MD;  Location: [facility];  Service: General;  Laterality: Right;     Social History:  reports that she has been smoking cigarettes. She started smoking about 39 years ago. She has a 9.00 pack-year smoking history. She has never used smokeless tobacco. She reports that she does not drink alcohol and does not use drugs.  Allergies  Allergen Reactions   Adhesive [Tape] Rash and Other (See Comments)    TAKES OFF THE SKIN (CERTAIN MEDICAL TAPES DO THIS!!)   Metoprolol Shortness Of Breath    Occurrence of shortness of breath after 3 days   Montelukast Shortness Of Breath   Morphine Sulfate Anaphylaxis, Shortness Of Breath and Nausea And Vomiting    Swollen Throat - Able to tolerate dilaudid   Penicillins Anaphylaxis, Hives and Shortness Of Breath    Throat swells Has patient had a PCN reaction causing immediate rash, facial/tongue/throat swelling, SOB or lightheadedness with hypotension: Yes Has patient had a PCN reaction causing severe rash involving mucus membranes or skin  necrosis: No Has patient had a PCN reaction that required hospitalization: Yes Has patient had a PCN reaction occurring within the last 10 years: No If all of the above answers are "NO", then may proceed with Cephalosporin use.    Diltiazem Swelling   Gabapentin Swelling   Midodrine     Lightheaded and falling down    Family History  Problem Relation Age of Onset   Venous thrombosis Brother    Other Brother        BRAIN TUMOR   Asthma Father    Diabetes Father    Coronary artery disease Mother    Hypertension Mother    Diabetes Mother    Breast cancer Mother 40   Asthma Sister    Diabetes type II Brother    Diabetes Brother      Prior to Admission medications   Medication Sig Start Date End Date Taking? Authorizing Provider  acetaminophen (TYLENOL) 500 MG tablet Take 1,000 mg by mouth every 6 (six) hours as needed for moderate pain.  Patient not taking: Reported on 12/23/2020    [provider]  albuterol (VENTOLIN HFA) 108 (90 Base) MCG/ACT inhaler Inhale 2  puffs into the lungs every 6 (six) hours as needed for wheezing or shortness of breath. Patient not taking: Reported on 12/23/2020    [provider]  ALPRAZolam Duanne Moron) 1 MG tablet Take 1 mg by mouth 4 (four) times daily as needed for anxiety.  12/02/19   [provider]  anastrozole (ARIMIDEX) 1 MG tablet Take 1 tablet (1 mg total) by mouth daily. 06/29/20   Cammie Sickle, MD  aspirin EC 81 MG tablet Take 81 mg by mouth daily before breakfast.     [provider]  budesonide-formoterol (SYMBICORT) 80-4.5 MCG/ACT inhaler INHALE 2 PUFFS BY MOUTH EVERY 12 HOURS TO PREVENT COUGH OR WHEEZING *RINSE MOUTH AFTER EACH USE* 12/01/20   Pleas Koch, NP  busPIRone (BUSPAR) 30 MG tablet Take 30 mg by mouth 2 (two) times daily.    [provider]  butalbital-acetaminophen-caffeine (FIORICET) 50-325-40 MG tablet Take 1-2 tablets by mouth every 6 (six) hours as needed for headache. Patient not taking: Reported on 12/23/2020 08/06/20 08/06/21  Jacquelin Hawking, NP  citalopram (CELEXA) 20 MG tablet Take 1 tablet (20 mg total) by mouth daily. 12/17/19   Cammie Sickle, MD  cyclobenzaprine (FLEXERIL) 10 MG tablet Take 1 tablet (10 mg total) by mouth 2 (two) times daily as needed for muscle spasms. 09/01/20   Pleas Koch, NP  desvenlafaxine (PRISTIQ) 100 MG 24 hr tablet Take 100 mg by mouth daily.    [provider]  Desvenlafaxine ER 100 MG TB24 Take 1 tablet by mouth every morning. 12/18/20   [provider]  diclofenac Sodium (VOLTAREN) 1 % GEL Apply 2 g topically 3 (three) times daily as needed. For pain. 09/01/20   Pleas Koch, NP  Empagliflozin-metFORMIN HCl ER 25-1000 MG TB24 Take 1 tablet by mouth daily with breakfast. 12/03/20 12/03/21  [provider]  famotidine (PEPCID) 20 MG tablet TAKE 1 TABLET (20 MG TOTAL) BY MOUTH 2 (TWO) TIMES DAILY. FOR HEARTBURN. Patient taking differently: Take 20 mg by mouth 2 (two) times daily.  06/05/19   Pleas Koch, NP  FLUoxetine (PROZAC) 40 MG capsule Take 40 mg by mouth daily. 10/27/20   [provider]  glucose blood test strip Use as instructed 12/25/20   Pleas Koch, NP  Insulin Pen Needle 32G  X 4 MM MISC Used to give insulin injections twice daily. Patient not taking: Reported on 12/23/2020 08/23/17   Renato Shin, MD  insulin regular human CONCENTRATED (HUMULIN R) 500 UNIT/ML injection Inject 30 Units into the skin 3 (three) times daily with meals.     [provider]  Insulin Syringe-Needle U-100 (GLOBAL INJECT EASE INSULIN SYR) 30G X 1/2" 1 ML MISC See admin instructions. 05/22/20 05/22/21  [provider]  ipratropium-albuterol (DUONEB) 0.5-2.5 (3) MG/3ML SOLN Take 3 mLs by nebulization every 4 (four) hours as needed (wheezing/shortness of breath). Patient not taking: Reported on 12/23/2020 08/02/17   Pleas Koch, NP  meclizine (ANTIVERT) 12.5 MG tablet Take 1 tablet (12.5 mg total) by mouth 2 (two) times daily as needed for dizziness. 10/01/20   Pleas Koch, NP  mupirocin ointment (BACTROBAN) 2 % Apply 1 application topically 2 (two) times daily. Apply to the affected area 2 times a day 09/29/20   Newt Minion, MD  nystatin-triamcinolone ointment [facility]) Apply 1 application topically 2 (two) times daily. 04/30/20   Ronny Bacon, MD  OXYGEN Inhale 2-4 L into the lungs 2 (two) times daily as needed (shortness of breath).     [provider]  pregabalin (LYRICA) 300 MG capsule Take 1 capsule (300 mg total) by mouth 2 (two) times daily. For nerve pain. 11/25/20   Pleas Koch, NP  ReliOn Ultra Thin Lancets 30G MISC Use as directed for blood sugar checks. 12/25/20   Pleas Koch, NP  rOPINIRole (REQUIP) 1 MG tablet TAKE 1 TABLET BY MOUTH EVERY NIGHT AT BEDTIME FOR RESTLESS LEGS. 06/08/20   Pleas Koch, NP  rosuvastatin (CRESTOR) 20 MG tablet Take 1 tablet (20 mg total) by mouth daily. For cholesterol.  08/18/20   Pleas Koch, NP  spironolactone (ALDACTONE) 50 MG tablet TAKE 1 TABLET BY MOUTH ONCE A DAY 04/17/20   Patwardhan, Manish J, MD  torsemide (DEMADEX) 20 MG tablet Take 2 tablets (40 mg total) by mouth 2 (two) times daily. 11/16/20   Venia Carbon, MD  traZODone (DESYREL) 100 MG tablet Take 200 mg by mouth at bedtime.  11/08/18   [provider]  ULTICARE MINI PEN NEEDLES 31G X 6 MM MISC USE AS DIRECTED 3 TIMES DAILY 05/18/20   Renato Shin, MD    Physical Exam: Vitals:   01/13/21 1801 01/13/21 1834 01/13/21 1835 01/13/21 1836  BP:  127/63 134/65   Pulse:   79 78  Resp:      Temp: 97.8 F (36.6 C)     TempSrc: Oral     SpO2:   100% 100%  Weight:      Height:       Constitutional: Obese woman resting supine in bed, NAD, calm, comfortable Eyes: PERRL, lids and conjunctivae normal ENMT: Mucous membranes are moist. Posterior pharynx clear of any exudate or lesions.Normal dentition.  Neck: normal, supple, no masses. Respiratory: Diffuse expiratory wheezing.  Normal respiratory effort. No accessory muscle use.  Cardiovascular: Regular rate and rhythm, no murmurs / rubs / gallops.  +1 left and trace right lower extremity edema. 2+ pedal pulses. Abdomen: no tenderness, no masses palpated. No hepatosplenomegaly.  Musculoskeletal: no clubbing / cyanosis. No joint deformity upper and lower extremities. Good ROM, no contractures. Normal muscle tone.  Skin: Erythema and swelling LLE anteriorly from ankle tracking proximally two thirds of the way to the knee.  Thickened skin at the heel with fluctuant nontense blister.  No open wounds,  discharge, or weeping from the skin. Neurologic: CN 2-12 grossly intact. Sensation intact. Strength 5/5 in all 4.  Psychiatric: Normal judgment and insight. Alert and oriented x 3. Normal mood.    Labs on Admission: I have personally reviewed following labs and imaging studies  CBC: Recent Labs  Lab 01/13/21 1505  WBC 10.2  NEUTROABS 7.7   HGB 12.4  HCT 37.8  MCV 84.6  PLT 597   Basic Metabolic Panel: Recent Labs  Lab 01/13/21 1505  NA 134*  K 3.6  CL 92*  CO2 30  GLUCOSE 194*  BUN 23*  CREATININE 1.14*  CALCIUM 9.0   GFR: Estimated Creatinine Clearance: 72 mL/min (A) (by C-G formula based on SCr of 1.14 mg/dL (H)). Liver Function Tests: Recent Labs  Lab 01/13/21 1505  AST 22  ALT 20  ALKPHOS 142*  BILITOT 0.9  PROT 8.1  ALBUMIN 3.5   No results for input(s): LIPASE, AMYLASE in the last 168 hours. No results for input(s): AMMONIA in the last 168 hours. Coagulation Profile: No results for input(s): INR, PROTIME in the last 168 hours. Cardiac Enzymes: No results for input(s): CKTOTAL, CKMB, CKMBINDEX, TROPONINI in the last 168 hours. BNP (last 3 results) Recent Labs    12/01/20 1250  PROBNP 78.0   HbA1C: No results for input(s): HGBA1C in the last 72 hours. CBG: No results for input(s): GLUCAP in the last 168 hours. Lipid Profile: No results for input(s): CHOL, HDL, LDLCALC, TRIG, CHOLHDL, LDLDIRECT in the last 72 hours. Thyroid Function Tests: No results for input(s): TSH, T4TOTAL, FREET4, T3FREE, THYROIDAB in the last 72 hours. Anemia Panel: No results for input(s): VITAMINB12, FOLATE, FERRITIN, TIBC, IRON, RETICCTPCT in the last 72 hours. Urine analysis:    Component Value Date/Time   COLORURINE STRAW (A) 11/05/2020 1232   APPEARANCEUR CLEAR (A) 11/05/2020 1232   LABSPEC 1.011 11/05/2020 1232   PHURINE 6.0 11/05/2020 1232   GLUCOSEU >=500 (A) 11/05/2020 1232   HGBUR SMALL (A) 11/05/2020 1232   HGBUR negative 03/07/2008 1643   BILIRUBINUR NEGATIVE 11/05/2020 1232   BILIRUBINUR neg 07/01/2020 0858   KETONESUR NEGATIVE 11/05/2020 1232   PROTEINUR 100 (A) 11/05/2020 1232   UROBILINOGEN 0.2 07/01/2020 0858   UROBILINOGEN 0.2 10/08/2011 1850   NITRITE NEGATIVE 11/05/2020 1232   LEUKOCYTESUR NEGATIVE 11/05/2020 1232    Radiological Exams on Admission: US Venous Img Lower Unilateral  Left (DVT)  Result Date: 01/13/2021 CLINICAL DATA:  Left lower extremity swelling X 1 day. EXAM: LEFT LOWER EXTREMITY VENOUS DOPPLER ULTRASOUND TECHNIQUE: Gray-scale sonography with compression, as well as color and duplex ultrasound, were performed to evaluate the deep venous system(s) from the level of the common femoral vein through the popliteal and proximal calf veins. COMPARISON:  None. FINDINGS: VENOUS Normal compressibility of the common femoral, superficial femoral, and popliteal veins, as well as the visualized calf veins. Visualized portions of profunda femoral vein and great saphenous vein unremarkable. No filling defects to suggest DVT on grayscale or color Doppler imaging. Doppler waveforms show normal direction of venous flow, normal respiratory plasticity and response to augmentation. Limited views of the contralateral common femoral vein are unremarkable. OTHER None. Limitations: none IMPRESSION: No deep venous thrombosis of the left lower extremity. Electronically Signed   By: Iven Finn M.D.   On: 01/13/2021 19:37   DG Foot Complete Left  Result Date: 01/13/2021 CLINICAL DATA:  Foot infection EXAM: LEFT FOOT - COMPLETE 3+ VIEW COMPARISON:  None. FINDINGS: There is no evidence of fracture or  dislocation. There is no evidence of arthropathy or other focal bone abnormality. Soft tissues are unremarkable. Large calcaneal enthesophytes. No osteolysis. IMPRESSION: Negative. Electronically Signed   By: Ulyses Jarred M.D.   On: 01/13/2021 19:15    EKG: Not performed.  Assessment/Plan Principal Problem:   Cellulitis of left lower extremity Active Problems:   COPD with chronic bronchitis (HCC)   Sleep apnea   Hypertension associated with diabetes ([place])   Coronary artery disease involving native heart without angina pectoris   Chronic respiratory failure with hypoxia (HCC)/ nocturnal 02 dep    Restless leg syndrome   Insulin dependent type 2 diabetes mellitus (HCC)   (HFpEF) heart  failure with preserved ejection fraction (HCC)   Hyperlipidemia associated with type 2 diabetes mellitus ([place])   Geetika Kinkaid is a 50 y.o. female with medical history significant for HFpEF (EF 55-60%), COPD, chronic respiratory failure with hypoxia on nocturnal supplemental O2 via [place], IDT2DM, HTN, HLD, CAD, breast cancer, bipolar/anxiety/depression, RLS, OSA, and tobacco use who is admitted with left lower extremity cellulitis.  Cellulitis of left lower extremity: -Continue Ancef -X-ray without evidence of osteomyelitis -Can potentially transition to oral antibiotics tomorrow  Insulin-dependent type 2 diabetes: Hemoglobin A1c 10.6% on 12/01/2020.  Continue reduced home Humulin 50 units BIDWC, add SSI.  Hold home empagliflozin-metformin.  COPD/chronic respiratory failure with nocturnal hypoxia: Wheezing present on admission.  Resume home Symbicort with albuterol/DuoNebs as needed.  HFpEF: Stable.  Continue torsemide.  CAD: Stable, denies chest pain.  Continue aspirin, statin.  Hypertension: Continue spironolactone, torsemide.  Hyperlipidemia: Continue rosuvastatin.  Right breast cancer s/p mastectomy: Continue anastrozole.  Bipolar/depression/anxiety: Continue BuSpar, Celexa, trazodone, and Xanax as needed.  Restless leg syndrome/diabetic peripheral neuropathy: Continue Requip and Lyrica.  OSA: Not using CPAP at home, instead using 3.5-4 L of home O2 via [place] when sleeping.  Continue supplemental O2.  Tobacco use: Reports smoking 1 pack/day.  Smoking cessation advised.  Nicotine patch ordered.  DVT prophylaxis: Lovenox Code Status: DNR, confirmed with patient on admission Family Communication: Discussed with patient's spouse at bedside Disposition Plan: From home and likely discharge to home pending improvement in cellulitis Consults called: None Level of care: Med-Surg Admission status:  Status is: Observation  The patient remains OBS appropriate and will d/c before  2 midnights.  Dispo: The patient is from: Home              Anticipated d/c is to: Home              Patient currently is not medically stable to d/c.   Difficult to place patient No   Zada Finders MD [facility]  If 7PM-7AM, please contact night-coverage [email or web address]  01/13/2021, 10:28 PM

## 2021-01-13 NOTE — Telephone Encounter (Signed)
Noted, will also evaluate in two days during her appointment.

## 2021-01-13 NOTE — ED Provider Notes (Signed)
[facility] Emergency Department Provider Note   ____________________________________________   Event Date/Time   First MD Initiated Contact with Patient 01/13/21 1829     (approximate)  I have reviewed the triage vital signs and the nursing notes.   HISTORY  Chief Complaint Cellulitis    HPI [name] is a 50 y.o. female with past medical history of hypertension, hyperlipidemia, diabetes, CKD, COPD, seizures, bipolar disorder, and chronic pain syndrome who presents to the ED complaining of left leg pain and swelling.  Patient reports that when she woke up this morning she noticed blistering to her left heel along with redness tracking up her left leg.  She has significant redness and swelling reaching the area of her left knee, primarily affecting her left shin.  The affected area is painful when she bears weight on her left leg, but she denies any fevers, nausea, vomiting, or recent trauma to the area.  She denies any history of DVT and does not take any blood thinners.        Past Medical History:  Diagnosis Date   Anxiety    takes Prozac daily   Anxiety    Aortic valve calcification    Asthma    Advair and Spirva daily   Asthma    Bipolar disorder (HCC)    CAD (coronary artery disease)    a. LHC 11/2013 done for CP/fluid retention: mild disease in prox LAD, mild-mod disease in mRCA, EF 60% with normal LVEDP. b. Normal nuc 03/2016.   Cancer (HCC)    CHF (congestive heart failure) (HCC)    Chronic diastolic CHF (congestive heart failure) (HCC)    Chronic heart failure with preserved ejection fraction ([place]) 11/16/2018   CKD (chronic kidney disease), stage II    COPD (chronic obstructive pulmonary disease) (HCC)    a. nocturnal O2.   COPD (chronic obstructive pulmonary disease) (HCC)    Coronary artery disease    Decreased urine stream    Diabetes mellitus    Diabetes mellitus without complication (HCC)    Dyspnea    Family history of  adverse reaction to anesthesia    mom gets nauseated   GERD (gastroesophageal reflux disease)    takes Pepcid daily   History of blood clots    left leg 3-33yrs ago   Hyperlipidemia    Hypertension    Hypertriglyceridemia    Inguinal hernia, left 01/2015   Muscle spasm    Open wound of genital labia    Peripheral neuropathy    RBBB    Seizures ([place])    Sepsis ([place]) 01/19/2018   Sinus tachycardia    a. persistent since 2009.   Smokers' cough ([place])    Stroke ([place]) 1989   left sided weakness   TIA (transient ischemic attack)    Tobacco abuse    Vulvar abscess 01/23/2018    Patient Active Problem List   Diagnosis Date Noted   Fatigue 12/01/2020   Acute on chronic diastolic heart failure ([place]) 11/16/2020   Chronic migraine without aura without status migrainosus, not intractable 10/01/2020   Dizziness 10/01/2020   Acute midline low back pain without sciatica 09/01/2020   Microscopic hematuria 07/01/2020   Pulmonary nodules 07/01/2020   Hav (hallux abducto valgus), unspecified laterality 06/11/2020   Venous ulcer of ankle, left ([place]) 05/15/2020   Contracture of hand joint 05/15/2020   Cellulitis and abscess of trunk 03/12/2020   Status post mastectomy, right 01/14/2020   Acute kidney injury superimposed on  CKD ([place])    Breast cancer, right breast ([place]) 12/23/2019   Hyperlipidemia    Carcinoma of lower-outer quadrant of right breast in female, estrogen receptor positive ([place]) 11/12/2019   Restrictive lung disease 10/08/2019   Postmenopausal bleeding 08/30/2019   Carotid stenosis, asymptomatic, right 08/16/2019   Nausea and vomiting 07/15/2019   Cellulitis of left lower extremity 06/04/2019   Multiple wounds of skin 06/04/2019   Other injury of unspecified body region, initial encounter 06/04/2019   Left-sided weakness 09/13/2018   Weakness of left lower extremity 09/05/2018   Recurrent falls 09/05/2018   PAD (peripheral artery disease) ([place]) 08/27/2018   Chronic  congestive heart failure ([place]) 08/02/2018   Vaginal itching 06/15/2018   Foot pain, bilateral 05/22/2018   Chronic upper extremity pain (Secondary Area of Pain) (Bilateral) 04/23/2018   Diabetic polyneuropathy associated with type 2 diabetes mellitus ([place]) 04/23/2018   Long term prescription benzodiazepine use 04/23/2018   Neurogenic pain 04/23/2018   Vitamin D insufficiency 01/29/2018   Elevated C-reactive protein (CRP) 01/29/2018   Elevated sedimentation rate 01/29/2018   Pressure injury of skin 01/29/2018   Poorly controlled type 2 diabetes mellitus ([place]) 01/23/2018   DNR (do not resuscitate) 01/23/2018   CKD stage 3 due to type 2 diabetes mellitus ([place]) 01/23/2018   IBS (irritable bowel syndrome) 01/19/2018   Elevated serum hCG 01/19/2018   Chronic lower extremity pain (Primary Area of Pain) (Bilateral) 01/08/2018   Chronic low back pain [facility] Area of Pain) (Bilateral) w/ sciatica (Bilateral) 01/08/2018   Chronic pain syndrome 01/08/2018   Pharmacologic therapy 01/08/2018   Disorder of skeletal system 01/08/2018   Problems influencing health status 01/08/2018   Long term current use of opiate analgesic 01/08/2018   Restless leg syndrome 08/02/2017   Nocturnal hypoxemia 03/29/2017   Vision disturbance 02/28/2017   CKD (chronic kidney disease), stage II 02/28/2017   Visual loss, bilateral    Chronic respiratory failure with hypoxia (HCC)/ nocturnal 02 dep  10/28/2016   Upper airway cough syndrome 08/22/2016   Cigarette smoker 06/15/2016   Simple chronic bronchitis ([place])    Coronary artery disease involving native heart without angina pectoris 05/16/2016   Chronic diastolic CHF (congestive heart failure) ([place]) 11/04/2015   Orthopnea 11/03/2015   Bilateral leg edema    Diabetes ([place])    COPD exacerbation ([place]) 10/15/2015   Fracture of rib, closed 10/15/2015   Venous stasis of both lower extremities 03/29/2015   Preventative health care 02/04/2015   Essential hypertension  01/02/2015   Inguinal hernia 11/27/2014   Allergic rhinitis with postnasal drip 01/21/2014   Aortic valve disorders 04/18/2013   Sleep apnea 05/22/2012   Syncope and collapse 10/09/2011   Seizure disorder ([place]) 10/09/2011   Bipolar disorder ([place]) 10/09/2011   TIA (transient ischemic attack) 06/22/2011   Constipation 06/15/2011   HYPERTRIGLYCERIDEMIA 07/17/2007   Situational anxiety 05/30/2007   End stage COPD ([place]) 05/30/2007   Morbid (severe) obesity due to excess calories ([place]) 04/23/2007   Tobacco abuse 09/07/2006    Past Surgical History:  Procedure Laterality Date   APPLICATION OF WOUND VAC Right 01/29/2020   Procedure: APPLICATION OF WOUND VAC;  Surgeon: Ronny Bacon, MD;  Location: [facility] ORS;  Service: General;  Laterality: Right;   BREAST BIOPSY Right 11/06/2019   [place] core path pending venus clip   HERNIA REPAIR Left    INCISION AND DRAINAGE ABSCESS N/A 01/26/2018   Procedure: INCISION AND DEBRIDEMENT OF VULVAR NECROTIZING SOFT TISSUE INFECTION;  Surgeon: Greer Pickerel, MD;  Location: [place];  Service: General;  Laterality: N/A;   INCISION AND DRAINAGE PERIRECTAL ABSCESS N/A 01/22/2018   Procedure: IRRIGATION AND DEBRIDEMENT LABIAL/VULVAR AREA;  Surgeon: Coralie Keens, MD;  Location: [place];  Service: General;  Laterality: N/A;   INCISION AND DRAINAGE PERIRECTAL ABSCESS N/A 01/29/2018   Procedure: IRRIGATION AND DEBRIDEMENT VULVA;  Surgeon: Excell Seltzer, MD;  Location: [place];  Service: General;  Laterality: N/A;   INGUINAL HERNIA REPAIR Left 04/08/2015   Procedure: OPEN LEFT INGUINAL HERNIA REPAIR WITH MESH;  Surgeon: Ralene Ok, MD;  Location: [place];  Service: General;  Laterality: Left;   INSERTION OF MESH Left 04/08/2015   Procedure: INSERTION OF MESH;  Surgeon: Ralene Ok, MD;  Location: [place];  Service: General;  Laterality: Left;   LAPAROSCOPY     Endometriosis   LEFT HEART CATHETERIZATION WITH CORONARY ANGIOGRAM N/A 12/05/2013   Procedure: LEFT HEART  CATHETERIZATION WITH CORONARY ANGIOGRAM;  Surgeon: Jettie Booze, MD;  Location: [facility] CATH LAB;  Service: Cardiovascular;  Laterality: N/A;   MASTECTOMY Right    right kidney drained     SIMPLE MASTECTOMY WITH AXILLARY SENTINEL NODE BIOPSY Right 12/23/2019   Procedure: Right SIMPLE MASTECTOMY WITH AXILLARY SENTINEL NODE BIOPSY;  Surgeon: Ronny Bacon, MD;  Location: [facility];  Service: General;  Laterality: Right;   TEE WITHOUT CARDIOVERSION N/A 11/28/2013   Procedure: TRANSESOPHAGEAL ECHOCARDIOGRAM (TEE);  Surgeon: Thayer Headings, MD;  Location: [place];  Service: Cardiovascular;  Laterality: N/A;   WOUND DEBRIDEMENT Right 01/29/2020   Procedure: DEBRIDEMENT WOUND, Excisional debridement skin, subcutaneous and muscle right chest wall;  Surgeon: Ronny Bacon, MD;  Location: [facility];  Service: General;  Laterality: Right;    Prior to Admission medications   Medication Sig Start Date End Date Taking? Authorizing Provider  acetaminophen (TYLENOL) 500 MG tablet Take 1,000 mg by mouth every 6 (six) hours as needed for moderate pain.  Patient not taking: Reported on 12/23/2020    [provider]  albuterol (VENTOLIN HFA) 108 (90 Base) MCG/ACT inhaler Inhale 2 puffs into the lungs every 6 (six) hours as needed for wheezing or shortness of breath. Patient not taking: Reported on 12/23/2020    [provider]  ALPRAZolam Duanne Moron) 1 MG tablet Take 1 mg by mouth 4 (four) times daily as needed for anxiety.  12/02/19   [provider]  anastrozole (ARIMIDEX) 1 MG tablet Take 1 tablet (1 mg total) by mouth daily. 06/29/20   Cammie Sickle, MD  aspirin EC 81 MG tablet Take 81 mg by mouth daily before breakfast.     [provider]  budesonide-formoterol (SYMBICORT) 80-4.5 MCG/ACT inhaler INHALE 2 PUFFS BY MOUTH EVERY 12 HOURS TO PREVENT COUGH OR WHEEZING *RINSE MOUTH AFTER EACH USE* 12/01/20   Pleas Koch, NP  busPIRone (BUSPAR) 30 MG tablet Take 30  mg by mouth 2 (two) times daily.    [provider]  butalbital-acetaminophen-caffeine (FIORICET) 50-325-40 MG tablet Take 1-2 tablets by mouth every 6 (six) hours as needed for headache. Patient not taking: Reported on 12/23/2020 08/06/20 08/06/21  Jacquelin Hawking, NP  citalopram (CELEXA) 20 MG tablet Take 1 tablet (20 mg total) by mouth daily. 12/17/19   Cammie Sickle, MD  cyclobenzaprine (FLEXERIL) 10 MG tablet Take 1 tablet (10 mg total) by mouth 2 (two) times daily as needed for muscle spasms. 09/01/20   Pleas Koch, NP  desvenlafaxine (PRISTIQ) 100 MG 24 hr tablet Take 100 mg by mouth daily.  [provider]  Desvenlafaxine ER 100 MG TB24 Take 1 tablet by mouth every morning. 12/18/20   [provider]  diclofenac Sodium (VOLTAREN) 1 % GEL Apply 2 g topically 3 (three) times daily as needed. For pain. 09/01/20   Pleas Koch, NP  Empagliflozin-metFORMIN HCl ER 25-1000 MG TB24 Take 1 tablet by mouth daily with breakfast. 12/03/20 12/03/21  [provider]  famotidine (PEPCID) 20 MG tablet TAKE 1 TABLET (20 MG TOTAL) BY MOUTH 2 (TWO) TIMES DAILY. FOR HEARTBURN. Patient taking differently: Take 20 mg by mouth 2 (two) times daily. 06/05/19   Pleas Koch, NP  FLUoxetine (PROZAC) 40 MG capsule Take 40 mg by mouth daily. 10/27/20   [provider]  glucose blood test strip Use as instructed 12/25/20   Pleas Koch, NP  Insulin Pen Needle 32G X 4 MM MISC Used to give insulin injections twice daily. Patient not taking: Reported on 12/23/2020 08/23/17   Renato Shin, MD  insulin regular human CONCENTRATED (HUMULIN R) 500 UNIT/ML injection Inject 30 Units into the skin 3 (three) times daily with meals.     [provider]  Insulin Syringe-Needle U-100 (GLOBAL INJECT EASE INSULIN SYR) 30G X 1/2" 1 ML MISC See admin instructions. 05/22/20 05/22/21  [provider]  ipratropium-albuterol (DUONEB) 0.5-2.5 (3) MG/3ML SOLN  Take 3 mLs by nebulization every 4 (four) hours as needed (wheezing/shortness of breath). Patient not taking: Reported on 12/23/2020 08/02/17   Pleas Koch, NP  meclizine (ANTIVERT) 12.5 MG tablet Take 1 tablet (12.5 mg total) by mouth 2 (two) times daily as needed for dizziness. 10/01/20   Pleas Koch, NP  mupirocin ointment (BACTROBAN) 2 % Apply 1 application topically 2 (two) times daily. Apply to the affected area 2 times a day 09/29/20   Newt Minion, MD  nystatin-triamcinolone ointment [facility]) Apply 1 application topically 2 (two) times daily. 04/30/20   Ronny Bacon, MD  OXYGEN Inhale 2-4 L into the lungs 2 (two) times daily as needed (shortness of breath).     [provider]  pregabalin (LYRICA) 300 MG capsule Take 1 capsule (300 mg total) by mouth 2 (two) times daily. For nerve pain. 11/25/20   Pleas Koch, NP  ReliOn Ultra Thin Lancets 30G MISC Use as directed for blood sugar checks. 12/25/20   Pleas Koch, NP  rOPINIRole (REQUIP) 1 MG tablet TAKE 1 TABLET BY MOUTH EVERY NIGHT AT BEDTIME FOR RESTLESS LEGS. 06/08/20   Pleas Koch, NP  rosuvastatin (CRESTOR) 20 MG tablet Take 1 tablet (20 mg total) by mouth daily. For cholesterol. 08/18/20   Pleas Koch, NP  spironolactone (ALDACTONE) 50 MG tablet TAKE 1 TABLET BY MOUTH ONCE A DAY 04/17/20   Patwardhan, Manish J, MD  torsemide (DEMADEX) 20 MG tablet Take 2 tablets (40 mg total) by mouth 2 (two) times daily. 11/16/20   Venia Carbon, MD  traZODone (DESYREL) 100 MG tablet Take 200 mg by mouth at bedtime.  11/08/18   [provider]  ULTICARE MINI PEN NEEDLES 31G X 6 MM MISC USE AS DIRECTED 3 TIMES DAILY 05/18/20   Renato Shin, MD    Allergies Adhesive [tape], Metoprolol, Montelukast, Morphine sulfate, Penicillins, Diltiazem, Gabapentin, and Midodrine  Family History  Problem Relation Age of Onset   Venous thrombosis Brother    Other Brother        BRAIN TUMOR   Asthma Father     Diabetes Father  Coronary artery disease Mother    Hypertension Mother    Diabetes Mother    Breast cancer Mother 47   Asthma Sister    Diabetes type II Brother    Diabetes Brother     Social History Social History   Tobacco Use   Smoking status: Some Days    Packs/day: 0.25    Years: 36.00    Pack years: 9.00    Types: Cigarettes    Start date: 07/11/1981   Smokeless tobacco: Never   Tobacco comments:    1 ppd now  Vaping Use   Vaping Use: Some days  Substance Use Topics   Alcohol use: No   Drug use: No    Review of Systems  Constitutional: No fever/chills Eyes: No visual changes. ENT: No sore throat. Cardiovascular: Denies chest pain. Respiratory: Denies shortness of breath. Gastrointestinal: No abdominal pain.  No nausea, no vomiting.  No diarrhea.  No constipation. Genitourinary: Negative for dysuria. Musculoskeletal: Negative for back pain. Positive for left leg pain and swelling.  Skin: Negative for rash. Neurological: Negative for headaches, focal weakness or numbness.  ____________________________________________   PHYSICAL EXAM:  VITAL SIGNS: ED Triage Vitals  Enc Vitals Group     BP 01/13/21 1502 (!) 105/93     Pulse Rate 01/13/21 1502 75     Resp 01/13/21 1502 17     Temp 01/13/21 1502 98.9 F (37.2 C)     Temp Source 01/13/21 1502 Oral     SpO2 01/13/21 1502 92 %     Weight 01/13/21 1500 249 lb 12.5 oz (113.3 kg)     Height 01/13/21 1500 5\' 4"  (1.626 m)     Head Circumference --      Peak Flow --      Pain Score 01/13/21 1500 8     Pain Loc --      Pain Edu? --      Excl. in [place]? --     Constitutional: Alert and oriented. Eyes: Conjunctivae are normal. Head: Atraumatic. Nose: No congestion/rhinnorhea. Mouth/Throat: Mucous membranes are moist. Neck: Normal ROM Cardiovascular: Normal rate, regular rhythm. Grossly normal heart sounds.  2+ DP pulses bilaterally. Respiratory: Normal respiratory effort.  No retractions. Lungs  CTAB. Gastrointestinal: Soft and nontender. No distention. Genitourinary: deferred Musculoskeletal: Erythema, warmth, and associated tenderness to palpation over left lower leg, primarily affecting left shin.  Large area of blistering to left heel with no associated purulence.  No right lower extremity pain or swelling. Neurologic:  Normal speech and language. No gross focal neurologic deficits are appreciated. Skin:  Skin is warm, dry and intact. No rash noted. Psychiatric: Mood and affect are normal. Speech and behavior are normal.  ____________________________________________   LABS (all labs ordered are listed, but only abnormal results are displayed)  Labs Reviewed  COMPREHENSIVE METABOLIC PANEL - Abnormal; Notable for the following components:      Result Value   Sodium 134 (*)    Chloride 92 (*)    Glucose, Bld 194 (*)    BUN 23 (*)    Creatinine, Ser 1.14 (*)    Alkaline Phosphatase 142 (*)    GFR, Estimated 58 (*)    All other components within normal limits  CBC WITH DIFFERENTIAL/PLATELET - Abnormal; Notable for the following components:   RDW 16.1 (*)    All other components within normal limits  SARS CORONAVIRUS 2 (TAT 6-24 HRS)  LACTIC ACID, PLASMA  URINALYSIS, COMPLETE (UACMP) WITH MICROSCOPIC    PROCEDURES  Procedure(s) performed (including Critical Care):  Procedures   ____________________________________________   INITIAL IMPRESSION / ASSESSMENT AND PLAN / ED COURSE      50 year old female with past medical history of hypertension, hyperlipidemia, diabetes, CKD, seizures, COPD, bipolar disorder, and chronic pain syndrome who presents to the ED with blistering to left heel with redness and swelling tracking upwards since waking up this morning.  Vital signs are reassuring and patient is overall well-appearing, no signs of sepsis.  She does have significant redness and swelling tracking up her left leg with blistering that is concerning for possible  osteomyelitis, especially given her history of poorly controlled diabetes.  We will further assess with x-ray, give initial dose of IV antibiotics.  Patient reports anaphylaxis to penicillins but has not previously tried cephalosporins.  After discussion of benefits and risks, we will proceed with IV cefepime along with vancomycin.  Patient tolerated cefepime without any signs of allergic reaction.  X-ray reviewed by me and shows no evidence of osteomyelitis or other bony process, although this does not completely rule out osteomyelitis. Case discussed with hospitalist for admission given her poorly controlled diabetes with significant cellulitis and concern for osteomyelitis.      ____________________________________________   FINAL CLINICAL IMPRESSION(S) / ED DIAGNOSES  Final diagnoses:  Leg swelling  Cellulitis of left lower extremity     ED Discharge Orders     None        Note:  This document was prepared using Dragon voice recognition software and may include unintentional dictation errors.    Blake Divine, MD 01/13/21 2109

## 2021-01-13 NOTE — Discharge Instructions (Addendum)
Go to the emergency department for evaluation of the extensive cellulitis in your leg and foot.

## 2021-01-13 NOTE — ED Notes (Signed)
md in with pt again

## 2021-01-14 ENCOUNTER — Encounter: Payer: Self-pay | Admitting: Internal Medicine

## 2021-01-14 DIAGNOSIS — L03119 Cellulitis of unspecified part of limb: Secondary | ICD-10-CM | POA: Diagnosis present

## 2021-01-14 DIAGNOSIS — E1122 Type 2 diabetes mellitus with diabetic chronic kidney disease: Secondary | ICD-10-CM | POA: Diagnosis present

## 2021-01-14 DIAGNOSIS — I130 Hypertensive heart and chronic kidney disease with heart failure and stage 1 through stage 4 chronic kidney disease, or unspecified chronic kidney disease: Secondary | ICD-10-CM | POA: Diagnosis present

## 2021-01-14 DIAGNOSIS — N182 Chronic kidney disease, stage 2 (mild): Secondary | ICD-10-CM | POA: Diagnosis present

## 2021-01-14 DIAGNOSIS — I69354 Hemiplegia and hemiparesis following cerebral infarction affecting left non-dominant side: Secondary | ICD-10-CM | POA: Diagnosis not present

## 2021-01-14 DIAGNOSIS — E785 Hyperlipidemia, unspecified: Secondary | ICD-10-CM | POA: Diagnosis present

## 2021-01-14 DIAGNOSIS — Z20822 Contact with and (suspected) exposure to covid-19: Secondary | ICD-10-CM | POA: Diagnosis present

## 2021-01-14 DIAGNOSIS — E11621 Type 2 diabetes mellitus with foot ulcer: Secondary | ICD-10-CM | POA: Diagnosis present

## 2021-01-14 DIAGNOSIS — G894 Chronic pain syndrome: Secondary | ICD-10-CM | POA: Diagnosis present

## 2021-01-14 DIAGNOSIS — K219 Gastro-esophageal reflux disease without esophagitis: Secondary | ICD-10-CM | POA: Diagnosis present

## 2021-01-14 DIAGNOSIS — J449 Chronic obstructive pulmonary disease, unspecified: Secondary | ICD-10-CM | POA: Diagnosis present

## 2021-01-14 DIAGNOSIS — I5032 Chronic diastolic (congestive) heart failure: Secondary | ICD-10-CM | POA: Diagnosis present

## 2021-01-14 DIAGNOSIS — I2510 Atherosclerotic heart disease of native coronary artery without angina pectoris: Secondary | ICD-10-CM | POA: Diagnosis present

## 2021-01-14 DIAGNOSIS — L97429 Non-pressure chronic ulcer of left heel and midfoot with unspecified severity: Secondary | ICD-10-CM | POA: Diagnosis present

## 2021-01-14 DIAGNOSIS — M7989 Other specified soft tissue disorders: Secondary | ICD-10-CM | POA: Diagnosis present

## 2021-01-14 DIAGNOSIS — E1169 Type 2 diabetes mellitus with other specified complication: Secondary | ICD-10-CM | POA: Diagnosis present

## 2021-01-14 DIAGNOSIS — L03116 Cellulitis of left lower limb: Secondary | ICD-10-CM | POA: Diagnosis not present

## 2021-01-14 DIAGNOSIS — G2581 Restless legs syndrome: Secondary | ICD-10-CM | POA: Diagnosis present

## 2021-01-14 DIAGNOSIS — J9611 Chronic respiratory failure with hypoxia: Secondary | ICD-10-CM | POA: Diagnosis present

## 2021-01-14 DIAGNOSIS — Z66 Do not resuscitate: Secondary | ICD-10-CM | POA: Diagnosis present

## 2021-01-14 DIAGNOSIS — E1142 Type 2 diabetes mellitus with diabetic polyneuropathy: Secondary | ICD-10-CM | POA: Diagnosis present

## 2021-01-14 DIAGNOSIS — F319 Bipolar disorder, unspecified: Secondary | ICD-10-CM | POA: Diagnosis present

## 2021-01-14 DIAGNOSIS — N179 Acute kidney failure, unspecified: Secondary | ICD-10-CM | POA: Diagnosis not present

## 2021-01-14 DIAGNOSIS — E781 Pure hyperglyceridemia: Secondary | ICD-10-CM | POA: Diagnosis present

## 2021-01-14 DIAGNOSIS — I152 Hypertension secondary to endocrine disorders: Secondary | ICD-10-CM | POA: Diagnosis present

## 2021-01-14 DIAGNOSIS — E1151 Type 2 diabetes mellitus with diabetic peripheral angiopathy without gangrene: Secondary | ICD-10-CM | POA: Diagnosis present

## 2021-01-14 LAB — URINALYSIS, COMPLETE (UACMP) WITH MICROSCOPIC
Bilirubin Urine: NEGATIVE
Glucose, UA: NEGATIVE mg/dL
Ketones, ur: NEGATIVE mg/dL
Leukocytes,Ua: NEGATIVE
Nitrite: NEGATIVE
Protein, ur: NEGATIVE mg/dL
Specific Gravity, Urine: 1.004 — ABNORMAL LOW (ref 1.005–1.030)
pH: 5 (ref 5.0–8.0)

## 2021-01-14 LAB — CBC
HCT: 36.8 % (ref 36.0–46.0)
Hemoglobin: 12.2 g/dL (ref 12.0–15.0)
MCH: 28.4 pg (ref 26.0–34.0)
MCHC: 33.2 g/dL (ref 30.0–36.0)
MCV: 85.8 fL (ref 80.0–100.0)
Platelets: 139 10*3/uL — ABNORMAL LOW (ref 150–400)
RBC: 4.29 MIL/uL (ref 3.87–5.11)
RDW: 16.3 % — ABNORMAL HIGH (ref 11.5–15.5)
WBC: 7.9 10*3/uL (ref 4.0–10.5)
nRBC: 0 % (ref 0.0–0.2)

## 2021-01-14 LAB — BASIC METABOLIC PANEL
Anion gap: 9 (ref 5–15)
BUN: 20 mg/dL (ref 6–20)
CO2: 32 mmol/L (ref 22–32)
Calcium: 8.9 mg/dL (ref 8.9–10.3)
Chloride: 96 mmol/L — ABNORMAL LOW (ref 98–111)
Creatinine, Ser: 1.04 mg/dL — ABNORMAL HIGH (ref 0.44–1.00)
GFR, Estimated: >60 mL/min (ref >60)
Glucose, Bld: 239 mg/dL — ABNORMAL HIGH (ref 70–99)
Potassium: 3.4 mmol/L — ABNORMAL LOW (ref 3.5–5.1)
Sodium: 137 mmol/L (ref 135–145)

## 2021-01-14 LAB — GLUCOSE, CAPILLARY
Glucose-Capillary: 224 mg/dL — ABNORMAL HIGH (ref 70–99)
Glucose-Capillary: 262 mg/dL — ABNORMAL HIGH (ref 70–99)
Glucose-Capillary: 320 mg/dL — ABNORMAL HIGH (ref 70–99)
Glucose-Capillary: 311 mg/dL — ABNORMAL HIGH (ref 70–99)
Glucose-Capillary: 346 mg/dL — ABNORMAL HIGH (ref 70–99)

## 2021-01-14 LAB — SARS CORONAVIRUS 2 (TAT 6-24 HRS): SARS Coronavirus 2: NEGATIVE

## 2021-01-14 LAB — HIV ANTIBODY (ROUTINE TESTING W REFLEX): HIV Screen 4th Generation wRfx: NONREACTIVE

## 2021-01-14 MED ORDER — VANCOMYCIN HCL 2000 MG/400ML IV SOLN
2000.0000 mg | Freq: Once | INTRAVENOUS | Status: AC
  Administered 2021-01-14: 2000 mg via INTRAVENOUS
  Filled 2021-01-14: qty 400

## 2021-01-14 MED ORDER — INSULIN REGULAR HUMAN (CONC) 500 UNIT/ML ~~LOC~~ SOPN
35.0000 [IU] | PEN_INJECTOR | Freq: Two times a day (BID) | SUBCUTANEOUS | Status: DC
  Administered 2021-01-14 – 2021-01-15 (×3): 35 [IU] via SUBCUTANEOUS
  Filled 2021-01-14: qty 3

## 2021-01-14 MED ORDER — POTASSIUM CHLORIDE CRYS ER 20 MEQ PO TBCR
20.0000 meq | EXTENDED_RELEASE_TABLET | Freq: Once | ORAL | Status: AC
  Administered 2021-01-14: 20 meq via ORAL
  Filled 2021-01-14: qty 1

## 2021-01-14 MED ORDER — LIVING WELL WITH DIABETES BOOK
Freq: Once | Status: AC
  Filled 2021-01-14: qty 1

## 2021-01-14 MED ORDER — INSULIN ASPART 100 UNIT/ML IJ SOLN
0.0000 [IU] | Freq: Every day | INTRAMUSCULAR | Status: DC
  Administered 2021-01-14: 4 [IU] via SUBCUTANEOUS
  Administered 2021-01-15: 5 [IU] via SUBCUTANEOUS
  Administered 2021-01-16: 3 [IU] via SUBCUTANEOUS
  Filled 2021-01-14 (×3): qty 1

## 2021-01-14 MED ORDER — INSULIN ASPART 100 UNIT/ML IJ SOLN
0.0000 [IU] | Freq: Three times a day (TID) | INTRAMUSCULAR | Status: DC
  Administered 2021-01-14 (×2): 15 [IU] via SUBCUTANEOUS
  Administered 2021-01-15 – 2021-01-16 (×5): 20 [IU] via SUBCUTANEOUS
  Administered 2021-01-16: 15 [IU] via SUBCUTANEOUS
  Administered 2021-01-16 (×2): 20 [IU] via SUBCUTANEOUS
  Administered 2021-01-17: 7 [IU] via SUBCUTANEOUS
  Filled 2021-01-14 (×11): qty 1

## 2021-01-14 MED ORDER — ALUM & MAG HYDROXIDE-SIMETH 200-200-20 MG/5ML PO SUSP
30.0000 mL | ORAL | Status: DC | PRN
  Administered 2021-01-14: 30 mL via ORAL
  Filled 2021-01-14: qty 30

## 2021-01-14 MED ORDER — INSULIN ASPART 100 UNIT/ML IJ SOLN: 0.0000 [IU] | Freq: Three times a day (TID) | INTRAMUSCULAR | Status: DC

## 2021-01-14 MED ORDER — VANCOMYCIN HCL 1500 MG/300ML IV SOLN
1500.0000 mg | INTRAVENOUS | Status: DC
  Filled 2021-01-14: qty 300

## 2021-01-14 NOTE — Progress Notes (Signed)
PROGRESS NOTE    [name]  UTM:[id number] DOB: 12-05-1970 DOA: 01/13/2021 PCP: Pleas Koch, NP    Brief Narrative:  [name] is a 50 y.o. female with medical history significant for HFpEF (EF 55-60%), COPD, chronic respiratory failure with hypoxia on nocturnal supplemental O2 via [place], IDT2DM, HTN, HLD, CAD, breast cancer, bipolar/anxiety/depression, RLS, OSA, and tobacco use who presented to the ED for evaluation of left lower extremity erythema and heel ulcer.    Consultants:    Procedures:   Antimicrobials:  cefezolin    Subjective: Does not feel LE erythema has improved. Has pain.   Objective: Vitals:   01/14/21 0113 01/14/21 0557 01/14/21 0743 01/14/21 1139  BP: 119/63 (!) 101/37 (!) 104/46 (!) 103/52  Pulse: 79 71 72 72  Resp: 18 18 19 18   Temp: 98.3 F (36.8 C) 98 F (36.7 C) 97.8 F (36.6 C) 97.9 F (36.6 C)  TempSrc: Oral Oral    SpO2: 94% 94% 98% 99%  Weight:      Height:        Intake/Output Summary (Last 24 hours) at 01/14/2021 1232 Last data filed at 01/14/2021 0955 Gross per 24 hour  Intake 480 ml  Output --  Net 480 ml   Filed Weights   01/13/21 1500  Weight: 113.3 kg    Examination:  General exam: Appears calm and comfortable  Respiratory system: Clear to auscultation. Respiratory effort normal. Cardiovascular system: S1 & S2 heard, RRR. No JVD, murmurs, rubs, gallops or clicks.  Gastrointestinal system: Abdomen is nondistended, soft and nontender. . Normal bowel sounds heard. Central nervous system: Alert and oriented. Grossly intact Extremities:b/l chronic skin changes. LLE anterior shin beat red, warm to touch. +edema. Heel with mild erythema.  Skin: warm, dry Psychiatry:  Mood & affect appropriate.     Data Reviewed: I have personally reviewed following labs and imaging studies  CBC: Recent Labs  Lab 01/13/21 1505 01/14/21 0617  WBC 10.2 7.9  NEUTROABS 7.7  --   HGB 12.4 12.2  HCT 37.8 36.8  MCV 84.6 85.8  PLT  155 568*   Basic Metabolic Panel: Recent Labs  Lab 01/13/21 1505 01/14/21 0617  NA 134* 137  K 3.6 3.4*  CL 92* 96*  CO2 30 32  GLUCOSE 194* 239*  BUN 23* 20  CREATININE 1.14* 1.04*  CALCIUM 9.0 8.9   GFR: Estimated Creatinine Clearance: 78.9 mL/min (A) (by C-G formula based on SCr of 1.04 mg/dL (H)). Liver Function Tests: Recent Labs  Lab 01/13/21 1505  AST 22  ALT 20  ALKPHOS 142*  BILITOT 0.9  PROT 8.1  ALBUMIN 3.5   No results for input(s): LIPASE, AMYLASE in the last 168 hours. No results for input(s): AMMONIA in the last 168 hours. Coagulation Profile: No results for input(s): INR, PROTIME in the last 168 hours. Cardiac Enzymes: No results for input(s): CKTOTAL, CKMB, CKMBINDEX, TROPONINI in the last 168 hours. BNP (last 3 results) Recent Labs    12/01/20 1250  PROBNP 78.0   HbA1C: No results for input(s): HGBA1C in the last 72 hours. CBG: Recent Labs  Lab 01/14/21 0125 01/14/21 0841 01/14/21 1220  GLUCAP 224* 262* 320*   Lipid Profile: No results for input(s): CHOL, HDL, LDLCALC, TRIG, CHOLHDL, LDLDIRECT in the last 72 hours. Thyroid Function Tests: No results for input(s): TSH, T4TOTAL, FREET4, T3FREE, THYROIDAB in the last 72 hours. Anemia Panel: No results for input(s): VITAMINB12, FOLATE, FERRITIN, TIBC, IRON, RETICCTPCT in the last 72 hours. Sepsis Labs:  Recent Labs  Lab 01/13/21 1505  LATICACIDVEN 1.1    Recent Results (from the past 240 hour(s))  SARS CORONAVIRUS 2 (TAT 6-24 HRS) Nasopharyngeal Nasopharyngeal Swab     Status: None   Collection Time: 01/13/21  8:56 PM   Specimen: Nasopharyngeal Swab  Result Value Ref Range Status   SARS Coronavirus 2 NEGATIVE NEGATIVE Final    Comment: (NOTE) SARS-CoV-2 target nucleic acids are NOT DETECTED.  The SARS-CoV-2 RNA is generally detectable in upper and lower respiratory specimens during the acute phase of infection. Negative results do not preclude SARS-CoV-2 infection, do not rule  out co-infections with other pathogens, and should not be used as the sole basis for treatment or other patient management decisions. Negative results must be combined with clinical observations, patient history, and epidemiological information. The expected result is Negative.  Fact Sheet for Patients: SugarRoll.be  Fact Sheet for Healthcare Providers: https://www.woods-mathews.com/  This test is not yet approved or cleared by the [place] FDA and  has been authorized for detection and/or diagnosis of SARS-CoV-2 by FDA under an Emergency Use Authorization (EUA). This EUA will remain  in effect (meaning this test can be used) for the duration of the COVID-19 declaration under Se ction 564(b)(1) of the Act, 21 U.S.C. section 360bbb-3(b)(1), unless the authorization is terminated or revoked sooner.  Performed at [facility], [place] [address], [place], [place] [address]          Radiology Studies: US Venous Img Lower Unilateral Left (DVT)  Result Date: 01/13/2021 CLINICAL DATA:  Left lower extremity swelling X 1 day. EXAM: LEFT LOWER EXTREMITY VENOUS DOPPLER ULTRASOUND TECHNIQUE: Gray-scale sonography with compression, as well as color and duplex ultrasound, were performed to evaluate the deep venous system(s) from the level of the common femoral vein through the popliteal and proximal calf veins. COMPARISON:  None. FINDINGS: VENOUS Normal compressibility of the common femoral, superficial femoral, and popliteal veins, as well as the visualized calf veins. Visualized portions of profunda femoral vein and great saphenous vein unremarkable. No filling defects to suggest DVT on grayscale or color Doppler imaging. Doppler waveforms show normal direction of venous flow, normal respiratory plasticity and response to augmentation. Limited views of the contralateral common femoral vein are unremarkable. OTHER None. Limitations: none IMPRESSION:  No deep venous thrombosis of the left lower extremity. Electronically Signed   By: Iven Finn M.D.   On: 01/13/2021 19:37   DG Foot Complete Left  Result Date: 01/13/2021 CLINICAL DATA:  Foot infection EXAM: LEFT FOOT - COMPLETE 3+ VIEW COMPARISON:  None. FINDINGS: There is no evidence of fracture or dislocation. There is no evidence of arthropathy or other focal bone abnormality. Soft tissues are unremarkable. Large calcaneal enthesophytes. No osteolysis. IMPRESSION: Negative. Electronically Signed   By: Ulyses Jarred M.D.   On: 01/13/2021 19:15        Scheduled Meds:  anastrozole  1 mg Oral Daily   aspirin EC  81 mg Oral QAC breakfast   busPIRone  30 mg Oral BID   citalopram  20 mg Oral Daily   enoxaparin (LOVENOX) injection  55 mg Subcutaneous Q24H   famotidine  20 mg Oral BID   insulin aspart  0-20 Units Subcutaneous TID WC   insulin aspart  0-5 Units Subcutaneous QHS   insulin regular human CONCENTRATED  35 Units Subcutaneous BID WC   living well with diabetes book   Does not apply Once   mometasone-formoterol  2 puff Inhalation BID  nicotine  21 mg Transdermal Daily   pregabalin  300 mg Oral BID   rOPINIRole  1 mg Oral QHS   rosuvastatin  20 mg Oral Daily   spironolactone  50 mg Oral Daily   torsemide  40 mg Oral BID   traZODone  200 mg Oral QHS   Continuous Infusions:  [START ON 01/15/2021] vancomycin     vancomycin      Assessment & Plan:   Principal Problem:   Cellulitis of left lower extremity Active Problems:   COPD with chronic bronchitis (HCC)   Sleep apnea   Hypertension associated with diabetes ([place])   Coronary artery disease involving native heart without angina pectoris   Chronic respiratory failure with hypoxia (HCC)/ nocturnal 02 dep    Restless leg syndrome   Insulin dependent type 2 diabetes mellitus (HCC)   (HFpEF) heart failure with preserved ejection fraction (HCC)   Hyperlipidemia associated with type 2 diabetes mellitus ([place])   [name] is a 50 y.o. female with medical history significant for HFpEF (EF 55-60%), COPD, chronic respiratory failure with hypoxia on nocturnal supplemental O2 via [place], IDT2DM, HTN, HLD, CAD, breast cancer, bipolar/anxiety/depression, RLS, OSA, and tobacco use who is admitted with left lower extremity cellulitis.   Cellulitis of left lower extremity: Not much improvement per pt with Ancef Will change to vancomycin Xr without osteomyel.      Insulin-dependent type 2 diabetes: Hemoglobin A1c 10.6% on 12/01/2020.   Diabetic educator following NovoLog correction has been made and the increased dose    COPD/chronic respiratory failure with nocturnal hypoxia: No wheezing this AM.  Continue Symbicort and MDI/nebs     HFpEF: Without acute exacerbation.   Continue torsemide     CAD: Stable, denies chest pain.  Continue aspirin, statin.   Hypertension: Continue spironolactone, torsemide.   Hyperlipidemia: Continue rosuvastatin.   Right breast cancer s/p mastectomy: Continue anastrozole.   Bipolar/depression/anxiety: Continue BuSpar, Celexa, trazodone, and Xanax as needed.   Restless leg syndrome/diabetic peripheral neuropathy: Continue Requip and Lyrica.   OSA: Not using CPAP at home, instead using 3.5-4 L of home O2 via [place] when sleeping.  Continue supplemental O2.   Tobacco use: Reports smoking 1 pack/day.  Smoking cessation advised.  Nicotine patch ordered   DVT prophylaxis: Lovenox Code Status: DNR Family Communication: None at bedside Disposition Plan:  Status is: Observation  The patient remains OBS appropriate and will d/c before 2 midnights.  Dispo: The patient is from: Home              Anticipated d/c is to: Home              Patient currently is not medically stable to d/c.   Difficult to place patient No            LOS: 0 days   Time spent: 35 minutes with more than 50% on [place], MD [facility] Pager [phone]  If  7PM-7AM, please contact night-coverage 01/14/2021, 12:32 PM

## 2021-01-14 NOTE — Consult Note (Signed)
Pharmacy Antibiotic Note  [name] is a 50 y.o. female with medical history including diabetes, COPD / chronic respiratory failure, HFpEF, CAD admitted on 01/13/2021 with cellulitis.  Pharmacy has been consulted for vancomycin dosing.  Plan:  Vancomycin 2 g IV LD followed by maintenance regimen of vancomycin 1.5 g IV q24h --Calculated AUC: 526.7, Cmin 12.2 --Daily Scr per protocol --Levels at steady state as clinically indicated  Height: 5\' 4"  (162.6 cm) Weight: 113.3 kg (249 lb 12.5 oz) IBW/kg (Calculated) : 54.7  Temp (24hrs), Avg:98.2 F (36.8 C), Min:97.8 F (36.6 C), Max:98.9 F (37.2 C)  Recent Labs  Lab 01/13/21 1505 01/14/21 0617  WBC 10.2 7.9  CREATININE 1.14* 1.04*  LATICACIDVEN 1.1  --     Estimated Creatinine Clearance: 78.9 mL/min (A) (by C-G formula based on SCr of 1.04 mg/dL (H)).    Allergies  Allergen Reactions   Adhesive [Tape] Rash and Other (See Comments)    TAKES OFF THE SKIN (CERTAIN MEDICAL TAPES DO THIS!!)   Metoprolol Shortness Of Breath    Occurrence of shortness of breath after 3 days   Montelukast Shortness Of Breath   Morphine Sulfate Anaphylaxis, Shortness Of Breath and Nausea And Vomiting    Swollen Throat - Able to tolerate dilaudid   Penicillins Anaphylaxis, Hives and Shortness Of Breath    Throat swells Has patient had a PCN reaction causing immediate rash, facial/tongue/throat swelling, SOB or lightheadedness with hypotension: Yes Has patient had a PCN reaction causing severe rash involving mucus membranes or skin necrosis: No Has patient had a PCN reaction that required hospitalization: Yes Has patient had a PCN reaction occurring within the last 10 years: No If all of the above answers are "NO", then may proceed with Cephalosporin use.    Diltiazem Swelling   Gabapentin Swelling   Midodrine     Lightheaded and falling down    Antimicrobials this admission: Cefepime 7/6 x 1 Vancomycin 7/6 x 1, 7/7 >>  Cefazolin 7/7 >>  7/8  Dose adjustments this admission: N/A  Microbiology results: N/A  Thank you for allowing pharmacy to be a part of this patient's care.  Benita Gutter 01/14/2021 11:49 AM

## 2021-01-14 NOTE — Progress Notes (Signed)
Inpatient Diabetes Program Recommendations  AACE/ADA: New Consensus Statement on Inpatient Glycemic Control (2015)  Target Ranges:  Prepandial:   less than 140 mg/dL      Peak postprandial:   less than 180 mg/dL (1-2 hours)      Critically ill patients:  140 - 180 mg/dL   Lab Results  Component Value Date   GLUCAP 262 (H) 01/14/2021   HGBA1C 10.6 (A) 12/01/2020    Review of Glycemic Control Results for [name], [name] (MRN [id number]) as of 01/14/2021 12:07  Ref. Range 01/14/2021 01:25 01/14/2021 08:41  Glucose-Capillary Latest Ref Range: 70 - 99 mg/dL 224 (H) 262 (H) Novolog 5 units   Diabetes history: DM2 Outpatient Diabetes medications: U500 insulin (varies based on CBGs) Current orders for Inpatient glycemic control: U500 insulin 35 units bid  Novolog 0-9 units tid  Inpatient Diabetes Program Recommendations:   -Increase Novolog correction to 0-20 units tid + hs 0-5 units  Spoke with patient and her husband. Patient sees Dr. Honor Junes for endocrinology. See note for patient instructions on last office visit below. Husband normally administers patient's insulin for her but does not give any insulin for CBG 139-150 due to patient fearful of hypoglycemia, and gives 150 units if CBG 250-300. Requested patient and husband to discuss with Dr. Honor Junes due to dosing is different that listed below in directions from office visit. Last A1c 10.6 on 12/01/20.  "Patient Instructions Toni Arthurs, MD - 12/03/2020 2:04 PM EDT   Formatting of this note might be different from the original. Let's add back a different version of the Xidguo I gave you last year. See if the other one (Synjardy XR) is cheaper. If it is, take one daily.  If not, you'll need to just stick with the insulin. Let's have you take 100 units twice a day (breakfast and supper) no matter if you check your sugar or not. If you check ahead of the meal and your sugar is high, increase the dose. If 200-250 take 150  instead of 100 If 251-300 take 200 instead of 100 If 301-350 take 250 instead of 100 If 351-400 take 300 instead of 100 If over 400 take 400."  Ordered Living Well With Diabetes book for patient to review.  Thank you, Nani Gasser. Kaylla Cobos, RN, MSN, CDE  Diabetes Coordinator Inpatient Glycemic Control Team Team Pager [phone] (8am-5pm) 01/14/2021 12:12 PM

## 2021-01-14 NOTE — ED Notes (Signed)
Report messaged to [employer]

## 2021-01-14 NOTE — Plan of Care (Signed)
  Problem: Education: Goal: Knowledge of General Education information will improve Description: Including pain rating scale, medication(s)/side effects and non-pharmacologic comfort measures Outcome: Progressing   Problem: Health Behavior/Discharge Planning: Goal: Ability to manage health-related needs will improve Outcome: Progressing   Problem: Clinical Measurements: Goal: Will remain free from infection Outcome: Progressing   

## 2021-01-14 NOTE — ED Notes (Signed)
Report called by vanessa rn to

## 2021-01-14 NOTE — TOC Progression Note (Signed)
Transition of Care [facility]) - Progression Note    Patient Details  Name: [name] MRN: [id number] Date of Birth: [birth year]  [facility]) CM/SW [place], RN Phone Number: 01/14/2021, 11:22 AM  Clinical Narrative:    Met with the patient to discuss DC plan and needs She has Oxygen at home and uses it only at night, does not use a CPAP due to intolerance, She has a rollator and a rolling walker as well as a 3 in 1 at home, she has transportation and can afford her medications        Expected Discharge Plan and Services                                                 Social Determinants of Health (SDOH) Interventions    Readmission Risk Interventions No flowsheet data found.

## 2021-01-14 NOTE — Progress Notes (Signed)
   Pt is active with [facility], the home-based [facility] Care division of [facility].  She receives home-based support by RN and SW.  Will continue to follow hospital course and plan to resume support of pt upon d/c back home.  Please call [facility] if we can be of any assistance with the d/c plan.  Thank you Quenton Fetter, RN  Office [phone] Mobile [phone]

## 2021-01-14 NOTE — ED Notes (Signed)
Pt sleeping.  Family with pt 

## 2021-01-15 ENCOUNTER — Other Ambulatory Visit (HOSPITAL_COMMUNITY): Payer: Self-pay

## 2021-01-15 ENCOUNTER — Ambulatory Visit: Payer: Medicare Other | Admitting: Primary Care

## 2021-01-15 ENCOUNTER — Encounter: Payer: Self-pay | Admitting: Internal Medicine

## 2021-01-15 LAB — GLUCOSE, CAPILLARY
Glucose-Capillary: 357 mg/dL — ABNORMAL HIGH (ref 70–99)
Glucose-Capillary: 363 mg/dL — ABNORMAL HIGH (ref 70–99)
Glucose-Capillary: 396 mg/dL — ABNORMAL HIGH (ref 70–99)
Glucose-Capillary: 457 mg/dL — ABNORMAL HIGH (ref 70–99)

## 2021-01-15 LAB — BASIC METABOLIC PANEL
Anion gap: 11 (ref 5–15)
BUN: 29 mg/dL — ABNORMAL HIGH (ref 6–20)
CO2: 29 mmol/L (ref 22–32)
Calcium: 8.8 mg/dL — ABNORMAL LOW (ref 8.9–10.3)
Chloride: 94 mmol/L — ABNORMAL LOW (ref 98–111)
Creatinine, Ser: 1.46 mg/dL — ABNORMAL HIGH (ref 0.44–1.00)
GFR, Estimated: 43 mL/min — ABNORMAL LOW (ref >60)
Glucose, Bld: 355 mg/dL — ABNORMAL HIGH (ref 70–99)
Potassium: 4 mmol/L (ref 3.5–5.1)
Sodium: 134 mmol/L — ABNORMAL LOW (ref 135–145)

## 2021-01-15 LAB — CREATININE, SERUM
Creatinine, Ser: 1.41 mg/dL — ABNORMAL HIGH (ref 0.44–1.00)
GFR, Estimated: 45 mL/min — ABNORMAL LOW (ref >60)

## 2021-01-15 MED ORDER — SODIUM CHLORIDE 0.45 % IV SOLN: INTRAVENOUS | Status: DC

## 2021-01-15 MED ORDER — OXYCODONE-ACETAMINOPHEN 5-325 MG PO TABS
1.0000 | ORAL_TABLET | ORAL | Status: DC | PRN
Start: 2021-01-15 — End: 2021-01-17
  Administered 2021-01-15 – 2021-01-17 (×5): 1 via ORAL
  Filled 2021-01-15 (×5): qty 1

## 2021-01-15 MED ORDER — VANCOMYCIN HCL IN DEXTROSE 1-5 GM/200ML-% IV SOLN
1000.0000 mg | INTRAVENOUS | Status: DC
  Administered 2021-01-15: 1000 mg via INTRAVENOUS
  Filled 2021-01-15 (×2): qty 200

## 2021-01-15 MED ORDER — INSULIN REGULAR HUMAN (CONC) 500 UNIT/ML ~~LOC~~ SOPN
40.0000 [IU] | PEN_INJECTOR | Freq: Two times a day (BID) | SUBCUTANEOUS | Status: DC
  Administered 2021-01-15 – 2021-01-16 (×2): 40 [IU] via SUBCUTANEOUS
  Filled 2021-01-15: qty 3

## 2021-01-15 MED ORDER — INSULIN ASPART 100 UNIT/ML IJ SOLN
7.0000 [IU] | Freq: Three times a day (TID) | INTRAMUSCULAR | Status: DC
  Administered 2021-01-15: 7 [IU] via SUBCUTANEOUS
  Filled 2021-01-15: qty 1

## 2021-01-15 NOTE — Progress Notes (Signed)
Inpatient Diabetes Program Recommendations  AACE/ADA: New Consensus Statement on Inpatient Glycemic Control (2015)  Target Ranges:  Prepandial:   less than 140 mg/dL      Peak postprandial:   less than 180 mg/dL (1-2 hours)      Critically ill patients:  140 - 180 mg/dL   Lab Results  Component Value Date   GLUCAP 357 (H) 01/15/2021   HGBA1C 10.6 (A) 12/01/2020    Review of Glycemic Control Results for [name], [name] (MRN [id number]) as of 01/15/2021 09:03  Ref. Range 01/14/2021 08:41 01/14/2021 12:20 01/14/2021 15:59 01/14/2021 20:48 01/15/2021 07:44  Glucose-Capillary Latest Ref Range: 70 - 99 mg/dL 262 (H) 320 (H) 311 (H) 346 (H) 357 (H)    Diabetes history: DM2 Outpatient Diabetes medications: U500 insulin (varies based on CBGs) If 200-250 take 150 instead of 100 If 251-300 take 200 instead of 100 If 301-350 take 250 instead of 100 If 351-400 take 300 instead of 100 If over 400 take 400."  Current orders for Inpatient glycemic control:  U500 insulin 35 units bid  Novolog 0-9 units tid  Inpatient Diabetes Program Recommendations:   -Increase Humulin R U-500 dose to 50 units bid   Thank you, Tama Headings RN, MSN, BC-ADM Inpatient Diabetes Coordinator Team Pager [phone] (8a-5p)

## 2021-01-15 NOTE — Consult Note (Signed)
Pharmacy Antibiotic Note  [name] is a 50 y.o. female with medical history including diabetes, COPD / chronic respiratory failure, HFpEF, CAD admitted on 01/13/2021 with cellulitis.  Pharmacy has been consulted for vancomycin dosing.  Plan:  Will adjust maintenance regimen of vancomycin to 1 g IV q24h d/t increase in SCR(1.04>1.46) --Calculated AUC: 476.1, Cmin 12.8 --Daily Scr per protocol --Levels at steady state as clinically indicated  Height: 5\' 4"  (162.6 cm) Weight: 113.3 kg (249 lb 12.5 oz) IBW/kg (Calculated) : 54.7  Temp (24hrs), Avg:97.9 F (36.6 C), Min:97.4 F (36.3 C), Max:98.1 F (36.7 C)  Recent Labs  Lab 01/13/21 1505 01/14/21 0617 01/15/21 0634  WBC 10.2 7.9  --   CREATININE 1.14* 1.04* 1.46*  1.41*  LATICACIDVEN 1.1  --   --      Estimated Creatinine Clearance: 58.2 mL/min (A) (by C-G formula based on SCr of 1.41 mg/dL (H)).    Allergies  Allergen Reactions   Adhesive [Tape] Rash and Other (See Comments)    TAKES OFF THE SKIN (CERTAIN MEDICAL TAPES DO THIS!!)   Metoprolol Shortness Of Breath    Occurrence of shortness of breath after 3 days   Montelukast Shortness Of Breath   Morphine Sulfate Anaphylaxis, Shortness Of Breath and Nausea And Vomiting    Swollen Throat - Able to tolerate dilaudid   Penicillins Anaphylaxis, Hives and Shortness Of Breath    Throat swells Has patient had a PCN reaction causing immediate rash, facial/tongue/throat swelling, SOB or lightheadedness with hypotension: Yes Has patient had a PCN reaction causing severe rash involving mucus membranes or skin necrosis: No Has patient had a PCN reaction that required hospitalization: Yes Has patient had a PCN reaction occurring within the last 10 years: No If all of the above answers are "NO", then may proceed with Cephalosporin use.    Diltiazem Swelling   Gabapentin Swelling   Midodrine     Lightheaded and falling down    Antimicrobials this admission: Cefepime 7/6 x  1 Vancomycin 7/6 x 1, 7/7 >>  Cefazolin 7/7 >> 7/8  Dose adjustments this admission: N/A  Microbiology results: N/A  Thank you for allowing pharmacy to be a part of this patient's care.  Hassen Bruun A Yovany Clock 01/15/2021 8:49 AM

## 2021-01-15 NOTE — Progress Notes (Addendum)
PROGRESS NOTE    [name]  XTK:[id number] DOB: February 11, 1971 DOA: 01/13/2021 PCP: Pleas Koch, NP    Brief Narrative:  [name] is a 50 y.o. female with medical history significant for HFpEF (EF 55-60%), COPD, chronic respiratory failure with hypoxia on nocturnal supplemental O2 via [place], IDT2DM, HTN, HLD, CAD, breast cancer, bipolar/anxiety/depression, RLS, OSA, and tobacco use who presented to the ED for evaluation of left lower extremity erythema and heel ulcer.  7/8- feels leg is getting better   Consultants:    Procedures:   Antimicrobials:  cefezolin    Subjective: Has pain asking for pain med, reports has no allergies to percocet as she has taken it in the past. Feels redness is improving.  Objective: Vitals:   01/14/21 0743 01/14/21 1139 01/14/21 1546 01/15/21 0548  BP: (!) 104/46 (!) 103/52 104/61 113/69  Pulse: 72 72 65 67  Resp: 19 18 20 17   Temp: 97.8 F (36.6 C) 97.9 F (36.6 C) 98.1 F (36.7 C) 98 F (36.7 C)  TempSrc:      SpO2: 98% 99% 96% 94%  Weight:      Height:        Intake/Output Summary (Last 24 hours) at 01/15/2021 0806 Last data filed at 01/15/2021 0431 Gross per 24 hour  Intake 1284.34 ml  Output --  Net 1284.34 ml   Filed Weights   01/13/21 1500  Weight: 113.3 kg    Examination:  Nad, calm Cta no w/r/r Reg s1/d2 no gallop Soft benign +bs Decrease LLE edema, erythema mildly Improving. Warm to touch aaxox4   Data Reviewed: I have personally reviewed following labs and imaging studies  CBC: Recent Labs  Lab 01/13/21 1505 01/14/21 0617  WBC 10.2 7.9  NEUTROABS 7.7  --   HGB 12.4 12.2  HCT 37.8 36.8  MCV 84.6 85.8  PLT 155 992*   Basic Metabolic Panel: Recent Labs  Lab 01/13/21 1505 01/14/21 0617 01/15/21 0634  NA 134* 137  --   K 3.6 3.4*  --   CL 92* 96*  --   CO2 30 32  --   GLUCOSE 194* 239*  --   BUN 23* 20  --   CREATININE 1.14* 1.04* 1.41*  CALCIUM 9.0 8.9  --    GFR: Estimated Creatinine  Clearance: 58.2 mL/min (A) (by C-G formula based on SCr of 1.41 mg/dL (H)). Liver Function Tests: Recent Labs  Lab 01/13/21 1505  AST 22  ALT 20  ALKPHOS 142*  BILITOT 0.9  PROT 8.1  ALBUMIN 3.5   No results for input(s): LIPASE, AMYLASE in the last 168 hours. No results for input(s): AMMONIA in the last 168 hours. Coagulation Profile: No results for input(s): INR, PROTIME in the last 168 hours. Cardiac Enzymes: No results for input(s): CKTOTAL, CKMB, CKMBINDEX, TROPONINI in the last 168 hours. BNP (last 3 results) Recent Labs    12/01/20 1250  PROBNP 78.0   HbA1C: No results for input(s): HGBA1C in the last 72 hours. CBG: Recent Labs  Lab 01/14/21 0841 01/14/21 1220 01/14/21 1559 01/14/21 2048 01/15/21 0744  GLUCAP 262* 320* 311* 346* 357*   Lipid Profile: No results for input(s): CHOL, HDL, LDLCALC, TRIG, CHOLHDL, LDLDIRECT in the last 72 hours. Thyroid Function Tests: No results for input(s): TSH, T4TOTAL, FREET4, T3FREE, THYROIDAB in the last 72 hours. Anemia Panel: No results for input(s): VITAMINB12, FOLATE, FERRITIN, TIBC, IRON, RETICCTPCT in the last 72 hours. Sepsis Labs: Recent Labs  Lab 01/13/21 1505  LATICACIDVEN 1.1  Recent Results (from the past 240 hour(s))  SARS CORONAVIRUS 2 (TAT 6-24 HRS) Nasopharyngeal Nasopharyngeal Swab     Status: None   Collection Time: 01/13/21  8:56 PM   Specimen: Nasopharyngeal Swab  Result Value Ref Range Status   SARS Coronavirus 2 NEGATIVE NEGATIVE Final    Comment: (NOTE) SARS-CoV-2 target nucleic acids are NOT DETECTED.  The SARS-CoV-2 RNA is generally detectable in upper and lower respiratory specimens during the acute phase of infection. Negative results do not preclude SARS-CoV-2 infection, do not rule out co-infections with other pathogens, and should not be used as the sole basis for treatment or other patient management decisions. Negative results must be combined with clinical  observations, patient history, and epidemiological information. The expected result is Negative.  Fact Sheet for Patients: SugarRoll.be  Fact Sheet for Healthcare Providers: https://www.woods-mathews.com/  This test is not yet approved or cleared by the [place] FDA and  has been authorized for detection and/or diagnosis of SARS-CoV-2 by FDA under an Emergency Use Authorization (EUA). This EUA will remain  in effect (meaning this test can be used) for the duration of the COVID-19 declaration under Se ction 564(b)(1) of the Act, 21 U.S.C. section 360bbb-3(b)(1), unless the authorization is terminated or revoked sooner.  Performed at [facility], [place] [address] [place], [place] [address]          Radiology Studies: US Venous Img Lower Unilateral Left (DVT)  Result Date: 01/13/2021 CLINICAL DATA:  Left lower extremity swelling X 1 day. EXAM: LEFT LOWER EXTREMITY VENOUS DOPPLER ULTRASOUND TECHNIQUE: Gray-scale sonography with compression, as well as color and duplex ultrasound, were performed to evaluate the deep venous system(s) from the level of the common femoral vein through the popliteal and proximal calf veins. COMPARISON:  None. FINDINGS: VENOUS Normal compressibility of the common femoral, superficial femoral, and popliteal veins, as well as the visualized calf veins. Visualized portions of profunda femoral vein and great saphenous vein unremarkable. No filling defects to suggest DVT on grayscale or color Doppler imaging. Doppler waveforms show normal direction of venous flow, normal respiratory plasticity and response to augmentation. Limited views of the contralateral common femoral vein are unremarkable. OTHER None. Limitations: none IMPRESSION: No deep venous thrombosis of the left lower extremity. Electronically Signed   By: Iven Finn M.D.   On: 01/13/2021 19:37   DG Foot Complete Left  Result Date:  01/13/2021 CLINICAL DATA:  Foot infection EXAM: LEFT FOOT - COMPLETE 3+ VIEW COMPARISON:  None. FINDINGS: There is no evidence of fracture or dislocation. There is no evidence of arthropathy or other focal bone abnormality. Soft tissues are unremarkable. Large calcaneal enthesophytes. No osteolysis. IMPRESSION: Negative. Electronically Signed   By: Ulyses Jarred M.D.   On: 01/13/2021 19:15        Scheduled Meds:  anastrozole  1 mg Oral Daily   aspirin EC  81 mg Oral QAC breakfast   busPIRone  30 mg Oral BID   citalopram  20 mg Oral Daily   enoxaparin (LOVENOX) injection  55 mg Subcutaneous Q24H   famotidine  20 mg Oral BID   insulin aspart  0-20 Units Subcutaneous TID WC   insulin aspart  0-5 Units Subcutaneous QHS   insulin regular human CONCENTRATED  35 Units Subcutaneous BID WC   mometasone-formoterol  2 puff Inhalation BID   nicotine  21 mg Transdermal Daily   pregabalin  300 mg Oral BID   rOPINIRole  1 mg Oral QHS   rosuvastatin  20 mg Oral Daily   traZODone  200 mg Oral QHS   Continuous Infusions:  vancomycin      Assessment & Plan:   Principal Problem:   Cellulitis of left lower extremity Active Problems:   COPD with chronic bronchitis (HCC)   Sleep apnea   Hypertension associated with diabetes ([place])   Coronary artery disease involving native heart without angina pectoris   Chronic respiratory failure with hypoxia (HCC)/ nocturnal 02 dep    Restless leg syndrome   Insulin dependent type 2 diabetes mellitus (HCC)   (HFpEF) heart failure with preserved ejection fraction (HCC)   Hyperlipidemia associated with type 2 diabetes mellitus ([place])   Cellulitis of lower extremity   [name] is a 50 y.o. female with medical history significant for HFpEF (EF 55-60%), COPD, chronic respiratory failure with hypoxia on nocturnal supplemental O2 via [place], IDT2DM, HTN, HLD, CAD, breast cancer, bipolar/anxiety/depression, RLS, OSA, and tobacco use who is admitted with left lower  extremity cellulitis.   Cellulitis of left lower extremity: Xr without osteomyel.  7/8 continue with vancomycin as it is helping with improving clinically Keep leg elevatede  AKI- cr 1.41-  Will hold aldactone and torsemide Start gentle hydration Monitor     Insulin-dependent type 2 diabetes: Hemoglobin A1c 10.6% on 12/01/2020.   7/8-BG elevated Will increase novolog to 35u bid Add novolog with meals 7 units tid    COPD/chronic respiratory failure with nocturnal hypoxia: No wheezing this AM.  Continue Symbicort and MDI/nebs     HFpEF: Without acute exacerbation.   Hold torsemide due to above    CAD: Stable, denies chest pain.  Continue aspirin, statin.   Hypertension: Continue spironolactone, torsemide.   Hyperlipidemia: Continue rosuvastatin.   Right breast cancer s/p mastectomy: Continue anastrozole.   Bipolar/depression/anxiety: Continue BuSpar, Celexa, trazodone, and Xanax as needed.   Restless leg syndrome/diabetic peripheral neuropathy: Continue Requip and Lyrica.   OSA: Not using CPAP at home, instead using 3.5-4 L of home O2 via [place] when sleeping.  Continue supplemental O2.   Tobacco use: Reports smoking 1 pack/day.  Smoking cessation advised.  Nicotine patch ordered   DVT prophylaxis: Lovenox Code Status: DNR Family Communication: husband updated at bedside Disposition Plan:  Status [id number]  Patient remains inpatient because of severity of her illness and requiring IV medications  Dispo: The patient is from: Home              Anticipated d/c is to: Home              Patient currently is not medically stable to d/c.   Difficult to place patient No            LOS: 1 day   Time spent: 35 minutes with more than 50% on [place], MD [facility] Pager [phone]  If 7PM-7AM, please contact night-coverage 01/15/2021, 8:06 AM

## 2021-01-16 LAB — BASIC METABOLIC PANEL
Anion gap: 9 (ref 5–15)
BUN: 28 mg/dL — ABNORMAL HIGH (ref 6–20)
CO2: 29 mmol/L (ref 22–32)
Calcium: 8.9 mg/dL (ref 8.9–10.3)
Chloride: 95 mmol/L — ABNORMAL LOW (ref 98–111)
Creatinine, Ser: 1.15 mg/dL — ABNORMAL HIGH (ref 0.44–1.00)
GFR, Estimated: 58 mL/min — ABNORMAL LOW (ref >60)
Glucose, Bld: 499 mg/dL — ABNORMAL HIGH (ref 70–99)
Potassium: 4.5 mmol/L (ref 3.5–5.1)
Sodium: 133 mmol/L — ABNORMAL LOW (ref 135–145)

## 2021-01-16 LAB — GLUCOSE, CAPILLARY
Glucose-Capillary: 275 mg/dL — ABNORMAL HIGH (ref 70–99)
Glucose-Capillary: 305 mg/dL — ABNORMAL HIGH (ref 70–99)
Glucose-Capillary: 360 mg/dL — ABNORMAL HIGH (ref 70–99)
Glucose-Capillary: 367 mg/dL — ABNORMAL HIGH (ref 70–99)
Glucose-Capillary: 394 mg/dL — ABNORMAL HIGH (ref 70–99)
Glucose-Capillary: 405 mg/dL — ABNORMAL HIGH (ref 70–99)
Glucose-Capillary: 415 mg/dL — ABNORMAL HIGH (ref 70–99)
Glucose-Capillary: 439 mg/dL — ABNORMAL HIGH (ref 70–99)
Glucose-Capillary: 468 mg/dL — ABNORMAL HIGH (ref 70–99)

## 2021-01-16 MED ORDER — INSULIN REGULAR HUMAN 100 UNIT/ML IJ SOLN: 5.0000 [IU] | Freq: Once | INTRAMUSCULAR | Status: DC

## 2021-01-16 MED ORDER — INSULIN REGULAR HUMAN 100 UNIT/ML IJ SOLN
4.0000 [IU] | Freq: Once | INTRAMUSCULAR | Status: AC
  Administered 2021-01-16: 4 [IU] via INTRAVENOUS
  Filled 2021-01-16: qty 10

## 2021-01-16 MED ORDER — INSULIN REGULAR HUMAN (CONC) 500 UNIT/ML ~~LOC~~ SOPN
50.0000 [IU] | PEN_INJECTOR | Freq: Two times a day (BID) | SUBCUTANEOUS | Status: DC
  Administered 2021-01-16 – 2021-01-17 (×2): 50 [IU] via SUBCUTANEOUS
  Filled 2021-01-16: qty 3

## 2021-01-16 MED ORDER — VANCOMYCIN HCL 1250 MG/250ML IV SOLN
1250.0000 mg | INTRAVENOUS | Status: DC
  Administered 2021-01-16: 1250 mg via INTRAVENOUS
  Filled 2021-01-16 (×2): qty 250

## 2021-01-16 MED ORDER — INSULIN ASPART 100 UNIT/ML IJ SOLN
12.0000 [IU] | Freq: Three times a day (TID) | INTRAMUSCULAR | Status: DC
  Administered 2021-01-16 – 2021-01-17 (×4): 12 [IU] via SUBCUTANEOUS
  Filled 2021-01-16 (×2): qty 1

## 2021-01-16 MED ORDER — INSULIN REGULAR HUMAN (CONC) 500 UNIT/ML ~~LOC~~ SOPN
45.0000 [IU] | PEN_INJECTOR | Freq: Two times a day (BID) | SUBCUTANEOUS | Status: DC
  Administered 2021-01-16: 5 [IU] via SUBCUTANEOUS
  Filled 2021-01-16: qty 3

## 2021-01-16 NOTE — Consult Note (Signed)
Pharmacy Antibiotic Note  [name] is a 50 y.o. female with medical history including diabetes, COPD / chronic respiratory failure, HFpEF, CAD admitted on 01/13/2021 with cellulitis.  Pharmacy has been consulted for vancomycin dosing.  Plan:  Change vancomycin to 1250 mg q24h --Calculated AUC: 480 --Daily Scr per protocol --Levels at steady state as clinically indicated  Height: 5\' 4"  (162.6 cm) Weight: 113.3 kg (249 lb 12.5 oz) IBW/kg (Calculated) : 54.7  Temp (24hrs), Avg:97.7 F (36.5 C), Min:97.5 F (36.4 C), Max:98.1 F (36.7 C)  Recent Labs  Lab 01/13/21 1505 01/14/21 0617 01/15/21 0634 01/16/21 0916  WBC 10.2 7.9  --   --   CREATININE 1.14* 1.04* 1.46*  1.41* 1.15*  LATICACIDVEN 1.1  --   --   --      Estimated Creatinine Clearance: 71.4 mL/min (A) (by C-G formula based on SCr of 1.15 mg/dL (H)).    Allergies  Allergen Reactions   Adhesive [Tape] Rash and Other (See Comments)    TAKES OFF THE SKIN (CERTAIN MEDICAL TAPES DO THIS!!)   Metoprolol Shortness Of Breath    Occurrence of shortness of breath after 3 days   Montelukast Shortness Of Breath   Morphine Sulfate Anaphylaxis, Shortness Of Breath and Nausea And Vomiting    Swollen Throat - Able to tolerate dilaudid   Penicillins Anaphylaxis, Hives and Shortness Of Breath    Throat swells Has patient had a PCN reaction causing immediate rash, facial/tongue/throat swelling, SOB or lightheadedness with hypotension: Yes Has patient had a PCN reaction causing severe rash involving mucus membranes or skin necrosis: No Has patient had a PCN reaction that required hospitalization: Yes Has patient had a PCN reaction occurring within the last 10 years: No If all of the above answers are "NO", then may proceed with Cephalosporin use.    Diltiazem Swelling   Gabapentin Swelling   Midodrine     Lightheaded and falling down    Antimicrobials this admission: Cefepime 7/6 x 1 Vancomycin 7/6  >>  Cefazolin 7/7 >>  7/8   Thank you for allowing pharmacy to be a part of this patient's care.  Tawnya Crook, PharmD 01/16/2021 9:58 AM

## 2021-01-16 NOTE — Progress Notes (Addendum)
10:55 Patient notified nursing that she was planning to leave AMA.  Patient was educated on the risk of leaving without discharge orders and prescriptions for antibiotics. She stated her main complaint was that we are not giving her the insulin she needs. She reports taking 200 units of Humulin at home.  Charge nurse was notified. Md was paged.   11:20 MD called and does not feel that patient's blood sugar is well controlled. No plan for discharge at this time.  Orders to give a one time dose of 5 units of humulin now, and to recheck sugars in one hour and give novolog again according to sliding scale.   13:24: CBG recheck was 439  13:35 Sliding scale and meal coverage given  14:46 CBG rechecked: 394. MD paged, verbal orders to give an extra dose of novolog per sliding scale.  16:51 CBG 305- Novolog given per sliding scale and for meal coverage

## 2021-01-16 NOTE — Progress Notes (Signed)
Patient ID: [name], female   DOB: 1970-10-27, 50 y.o.   M PROGRESS NOTE    [name]  ZOX:[id number] DOB: 11/07/1970 DOA: 01/13/2021 PCP: Pleas Koch, NP    Brief Narrative:  [name] is a 50 y.o. female with medical history significant for HFpEF (EF 55-60%), COPD, chronic respiratory failure with hypoxia on nocturnal supplemental O2 via [place], IDT2DM, HTN, HLD, CAD, breast cancer, bipolar/anxiety/depression, RLS, OSA, and tobacco use who presented to the ED for evaluation of left lower extremity erythema and heel ulcer.  7/9-BG elevated this am. Reports her husband gives her 200 units of insulin based on sliding scale?? Told pt she is getting sliding scale here too but that's not enough. Also on standing dose of insulin and novolog with meal. She wants to go home despite bg in 400's, encouraged pt to stay as she wants to sign out AMA , told her the danger of elevated bg and also impact on healing from her cellulitis. She then complaints her night time nurse was rude to her and left her water away from her reach. Told pt I will discuss this with charge nurse .     Consultants:    Procedures:   Antimicrobials:  cefezolin -d/c vancomycin   Subjective: Feels feet is improving. No other complaints other than above  Objective: Vitals:   01/15/21 1919 01/16/21 0405 01/16/21 0810 01/16/21 1133  BP: (!) 152/73 137/66 (!) 156/69 130/67  Pulse: 72 71 78 67  Resp: 18 17 17 18   Temp: (!) 97.5 F (36.4 C) (!) 97.5 F (36.4 C) 98.1 F (36.7 C) 98.4 F (36.9 C)  TempSrc:      SpO2: 96% 96% 92% 96%  Weight:      Height:        Intake/Output Summary (Last 24 hours) at 01/16/2021 1446 Last data filed at 01/16/2021 1339 Gross per 24 hour  Intake 870.2 ml  Output --  Net 870.2 ml   Filed Weights   01/13/21 1500  Weight: 113.3 kg    Examination: Calm, nad Cta no w/r Regular s1/s2 no gallops Soft benign +bs Less erythema and warmth, less edema Aaxox4 Mood and  affect a bit more aggressive today  Data Reviewed: I have personally reviewed following labs and imaging studies  CBC: Recent Labs  Lab 01/13/21 1505 01/14/21 0617  WBC 10.2 7.9  NEUTROABS 7.7  --   HGB 12.4 12.2  HCT 37.8 36.8  MCV 84.6 85.8  PLT 155 811*   Basic Metabolic Panel: Recent Labs  Lab 01/13/21 1505 01/14/21 0617 01/15/21 0634 01/16/21 0916  NA 134* 137 134* 133*  K 3.6 3.4* 4.0 4.5  CL 92* 96* 94* 95*  CO2 30 32 29 29  GLUCOSE 194* 239* 355* 499*  BUN 23* 20 29* 28*  CREATININE 1.14* 1.04* 1.46*  1.41* 1.15*  CALCIUM 9.0 8.9 8.8* 8.9   GFR: Estimated Creatinine Clearance: 71.4 mL/min (A) (by C-G formula based on SCr of 1.15 mg/dL (H)). Liver Function Tests: Recent Labs  Lab 01/13/21 1505  AST 22  ALT 20  ALKPHOS 142*  BILITOT 0.9  PROT 8.1  ALBUMIN 3.5   No results for input(s): LIPASE, AMYLASE in the last 168 hours. No results for input(s): AMMONIA in the last 168 hours. Coagulation Profile: No results for input(s): INR, PROTIME in the last 168 hours. Cardiac Enzymes: No results for input(s): CKTOTAL, CKMB, CKMBINDEX, TROPONINI in the last 168 hours. BNP (last 3 results) Recent Labs  12/01/20 1250  PROBNP 78.0   HbA1C: No results for input(s): HGBA1C in the last 72 hours. CBG: Recent Labs  Lab 01/16/21 0316 01/16/21 0420 01/16/21 0850 01/16/21 1043 01/16/21 1324  GLUCAP 367* 360* 415* 468* 439*   Lipid Profile: No results for input(s): CHOL, HDL, LDLCALC, TRIG, CHOLHDL, LDLDIRECT in the last 72 hours. Thyroid Function Tests: No results for input(s): TSH, T4TOTAL, FREET4, T3FREE, THYROIDAB in the last 72 hours. Anemia Panel: No results for input(s): VITAMINB12, FOLATE, FERRITIN, TIBC, IRON, RETICCTPCT in the last 72 hours. Sepsis Labs: Recent Labs  Lab 01/13/21 1505  LATICACIDVEN 1.1    Recent Results (from the past 240 hour(s))  SARS CORONAVIRUS 2 (TAT 6-24 HRS) Nasopharyngeal Nasopharyngeal Swab     Status: None    Collection Time: 01/13/21  8:56 PM   Specimen: Nasopharyngeal Swab  Result Value Ref Range Status   SARS Coronavirus 2 NEGATIVE NEGATIVE Final    Comment: (NOTE) SARS-CoV-2 target nucleic acids are NOT DETECTED.  The SARS-CoV-2 RNA is generally detectable in upper and lower respiratory specimens during the acute phase of infection. Negative results do not preclude SARS-CoV-2 infection, do not rule out co-infections with other pathogens, and should not be used as the sole basis for treatment or other patient management decisions. Negative results must be combined with clinical observations, patient history, and epidemiological information. The expected result is Negative.  Fact Sheet for Patients: SugarRoll.be  Fact Sheet for Healthcare Providers: https://www.woods-mathews.com/  This test is not yet approved or cleared by the [place] FDA and  has been authorized for detection and/or diagnosis of SARS-CoV-2 by FDA under an Emergency Use Authorization (EUA). This EUA will remain  in effect (meaning this test can be used) for the duration of the COVID-19 declaration under Se ction 564(b)(1) of the Act, 21 U.S.C. section 360bbb-3(b)(1), unless the authorization is terminated or revoked sooner.  Performed at [facility], [place] [address], [place],  [address]          Radiology Studies: No results found.      Scheduled Meds:  anastrozole  1 mg Oral Daily   aspirin EC  81 mg Oral QAC breakfast   busPIRone  30 mg Oral BID   citalopram  20 mg Oral Daily   enoxaparin (LOVENOX) injection  55 mg Subcutaneous Q24H   famotidine  20 mg Oral BID   insulin aspart  0-20 Units Subcutaneous TID WC   insulin aspart  0-5 Units Subcutaneous QHS   insulin aspart  12 Units Subcutaneous TID WC   insulin regular human CONCENTRATED  50 Units Subcutaneous BID WC   mometasone-formoterol  2 puff Inhalation BID   nicotine  21 mg  Transdermal Daily   pregabalin  300 mg Oral BID   rOPINIRole  1 mg Oral QHS   rosuvastatin  20 mg Oral Daily   traZODone  200 mg Oral QHS   Continuous Infusions:  vancomycin      Assessment & Plan:   Principal Problem:   Cellulitis of left lower extremity Active Problems:   COPD with chronic bronchitis (HCC)   Sleep apnea   Hypertension associated with diabetes ([place])   Coronary artery disease involving native heart without angina pectoris   Chronic respiratory failure with hypoxia (HCC)/ nocturnal 02 dep    Restless leg syndrome   Insulin dependent type 2 diabetes mellitus (HCC)   (HFpEF) heart failure with preserved ejection fraction ([place])   Hyperlipidemia associated with type 2 diabetes mellitus (  [place])   Cellulitis of lower extremity   [name] is a 50 y.o. female with medical history significant for HFpEF (EF 55-60%), COPD, chronic respiratory failure with hypoxia on nocturnal supplemental O2 via [place], IDT2DM, HTN, HLD, CAD, breast cancer, bipolar/anxiety/depression, RLS, OSA, and tobacco use who is admitted with left lower extremity cellulitis.   Cellulitis of left lower extremity: Xr without osteomyel.  7/9 clinically improving slowly Continue vancomycin  Continue keeping legs elevated    AKI- cr 1.41-  Likely prerenal  Improved with gentle hydration  Continue to hold torsemide and Aldactone  Monitor      Insulin-dependent type 2 diabetes: Uncontrolled with hyperglycemia Hemoglobin A1c 10.6% on 12/01/2020.   7/9 BG very elevated and uncontrolled Increase Humulin R to 50 units twice daily Increase her NovoLog with meals to 12 units 3 times daily Continue R-ISS   COPD/chronic respiratory failure with nocturnal hypoxia: No wheezing Without acute exacerbation Continue Symbicort and MDIs/nebs      HFpEF: Without acute exacerbation.   Hold torsemide due to above    CAD: Stable, denies chest pain.  Continue aspirin, statin.   Hypertension: Continue  spironolactone, torsemide.   Hyperlipidemia: Continue rosuvastatin.   Right breast cancer s/p mastectomy: Continue anastrozole.   Bipolar/depression/anxiety: Continue BuSpar, Celexa, trazodone, and Xanax as needed.   Restless leg syndrome/diabetic peripheral neuropathy: Continue Requip and Lyrica.   OSA: Not using CPAP at home, instead using 3.5-4 L of home O2 via [place] when sleeping.  Continue supplemental O2.   Tobacco use: Reports smoking 1 pack/day.  Smoking cessation advised.  Nicotine patch ordered   DVT prophylaxis: Lovenox Code Status: DNR Family Communication: None at bedside Disposition Plan:  Status [id number]  Patient remains inpatient because of severity of her illness and requiring IV medications  Dispo: The patient is from: Home              Anticipated d/c is to: Home              Patient currently is not medically stable to d/c.   Difficult to place patient No    Blood glucose levels are uncontrolled and elevated.        LOS: 2 days   Time spent: 35 minutes with more than 50% on [place], MD [facility] Pager [phone]  If 7PM-7AM, please contact night-coverage 01/16/2021, 2:46 PM RN: [phone]

## 2021-01-16 NOTE — Progress Notes (Signed)
Pt's CBG 457 at bedtime.Pt A&Ox4,no ssx of hyperglycemia noted. MD notified per protocol. MD order to give SSI Novolog 20 units and recheck CBG at midnight.CBG was rechecked and noted at 405.MD notified of blood sugar.new order to give IV 4units of Novolin R and recheck CBG every one hour twice.IV Novolin 4 units was administered per order at 0148 hrs.CBG was rechecked after one hour and was 367,second recheck noted at 360.no new orders at this time.will continue to monitor CBG per protocol.Pt education provided on compliance with diet order.Pt remain adamant and consuming crackers and food brought by spouse.

## 2021-01-17 LAB — CREATININE, SERUM
Creatinine, Ser: 1.18 mg/dL — ABNORMAL HIGH (ref 0.44–1.00)
GFR, Estimated: 56 mL/min — ABNORMAL LOW (ref >60)

## 2021-01-17 LAB — GLUCOSE, CAPILLARY: Glucose-Capillary: 217 mg/dL — ABNORMAL HIGH (ref 70–99)

## 2021-01-17 MED ORDER — OXYCODONE-ACETAMINOPHEN 5-325 MG PO TABS: 1.0000 | ORAL_TABLET | ORAL | 0 refills | Status: AC | PRN

## 2021-01-17 MED ORDER — INSULIN ASPART 100 UNIT/ML IJ SOLN
17.0000 [IU] | Freq: Three times a day (TID) | INTRAMUSCULAR | Status: DC
  Filled 2021-01-17: qty 1

## 2021-01-17 MED ORDER — CEFUROXIME AXETIL 500 MG PO TABS: 500.0000 mg | ORAL_TABLET | Freq: Two times a day (BID) | ORAL | 0 refills | Status: AC

## 2021-01-17 MED ORDER — FAMOTIDINE 20 MG PO TABS: 20.0000 mg | ORAL_TABLET | Freq: Two times a day (BID) | ORAL | 0 refills | Status: DC

## 2021-01-17 MED ORDER — CEFUROXIME AXETIL 500 MG PO TABS
500.0000 mg | ORAL_TABLET | Freq: Two times a day (BID) | ORAL | Status: DC
  Filled 2021-01-17: qty 1

## 2021-01-17 NOTE — Discharge Summary (Signed)
[name] [id number]:[id number] DOB: 01-01-1971 DOA: 01/13/2021  PCP: Pleas Koch, NP  Admit date: 01/13/2021 Discharge date: 01/17/2021  Admitted From: Home Disposition: Home  Recommendations for Outpatient Follow-up:  Follow up with PCP in 1 week Please obtain BMP/CBC in one week      Discharge Condition:Stable CODE STATUS: DNR Diet recommendation: Carb modified    Brief/Interim Summary: [name] is a 50 y.o. female with medical history significant for HFpEF (EF 55-60%), COPD, chronic respiratory failure with hypoxia on nocturnal supplemental O2 via [place], IDT2DM, HTN, HLD, CAD, breast cancer, bipolar/anxiety/depression, RLS, OSA, and tobacco use who presented to the ED for evaluation of left lower extremity erythema and heel ulcer found with left lower extremity cellulitis.  Started on IV antibiotics.  X-ray without osteomyelitis.   Cellulitis of left lower extremity: Xr without osteomyel.  Clinically better Switch iv vanco to po abx (discussed with pharmacy about abx choice) Keep legs elevated F/u with pcp next week for further management and check     AKI- cr 1.41-  Likely prerenal Improved with IV fluids.holding torsemide and aldactone, will need to f/u with pcp and discuss these meds         Insulin-dependent type 2 diabetes: Uncontrolled with hyperglycemia Hemoglobin A1c 10.6% on 12/01/2020.   BG was very elevated. Pt was also eating [employer] crackers. Insulin was adjusted. When she stopped eating the crackers were able to control BG. Was going to send her home with insulin we had set her up with , but pt and husband refused. They will use their own and it sounds like they are doing sliding scale. I did discuss the importance of keeping her hyperglycemia under control especially for infection healing.  Also discussed with them the need to follow-up with their primary care for further management as her diabetes is not well controlled.  They verbalized an  understanding.     COPD/chronic respiratory failure with nocturnal hypoxia: Without acute exacerbation Continue meds         HFpEF: Without acute exacerbation.   Hold torsemide due to above, will need to follow-up with PCP for further management     CAD:  Continue aspirin, statin.      Hyperlipidemia: Continue statin Right breast cancer s/p mastectomy: Continue anastrozole.   Bipolar/depression/anxiety: Continue BuSpar, Celexa, trazodone, and Xanax as needed.   Restless leg syndrome/diabetic peripheral neuropathy: Continue home meds   OSA: Not using CPAP at home, instead using 3.5-4 L of home O2 via [place] when sleeping.  Continue supplemental O2.   Tobacco use: Reports smoking 1 pack/day.  Smoking cessation advised.    Discharge Diagnoses:  Principal Problem:   Cellulitis of left lower extremity Active Problems:   COPD with chronic bronchitis (HCC)   Sleep apnea   Hypertension associated with diabetes ([place])   Coronary artery disease involving native heart without angina pectoris   Chronic respiratory failure with hypoxia (HCC)/ nocturnal 02 dep    Restless leg syndrome   Insulin dependent type 2 diabetes mellitus (HCC)   (HFpEF) heart failure with preserved ejection fraction (HCC)   Hyperlipidemia associated with type 2 diabetes mellitus ([place])   Cellulitis of lower extremity    Discharge Instructions  Discharge Instructions     Call MD for:  temperature >100.4   Complete by: As directed    Diet - low sodium heart healthy   Complete by: As directed    Diet Carb Modified   Complete by: As directed    Discharge instructions  Complete by: As directed    Stop smoking Follow up with your primary care for better glucose control and your leg infection Need to discuss your aldactone and torsemide with your pcp   Increase activity slowly   Complete by: As directed       Allergies as of 01/17/2021       Reactions   Adhesive [tape] Rash, Other (See  Comments)   TAKES OFF THE SKIN (CERTAIN MEDICAL TAPES DO THIS!!)   Metoprolol Shortness Of Breath   Occurrence of shortness of breath after 3 days   Montelukast Shortness Of Breath   Morphine Sulfate Anaphylaxis, Shortness Of Breath, Nausea And Vomiting   Swollen Throat - Able to tolerate dilaudid   Penicillins Anaphylaxis, Hives, Shortness Of Breath   Throat swells Has patient had a PCN reaction causing immediate rash, facial/tongue/throat swelling, SOB or lightheadedness with hypotension: Yes Has patient had a PCN reaction causing severe rash involving mucus membranes or skin necrosis: No Has patient had a PCN reaction that required hospitalization: Yes Has patient had a PCN reaction occurring within the last 10 years: No If all of the above answers are "NO", then may proceed with Cephalosporin use.   Diltiazem Swelling   Gabapentin Swelling   Midodrine    Lightheaded and falling down        Medication List     STOP taking these medications    acetaminophen 500 MG tablet Commonly known as: TYLENOL   aspirin EC 325 MG tablet   butalbital-acetaminophen-caffeine 50-325-40 MG tablet Commonly known as: FIORICET   cyclobenzaprine 10 MG tablet Commonly known as: FLEXERIL   ipratropium-albuterol 0.5-2.5 (3) MG/3ML Soln Commonly known as: DUONEB   meclizine 12.5 MG tablet Commonly known as: ANTIVERT   spironolactone 50 MG tablet Commonly known as: ALDACTONE   torsemide 20 MG tablet Commonly known as: DEMADEX       TAKE these medications    ALPRAZolam 1 MG tablet Commonly known as: XANAX Take 1 mg by mouth 4 (four) times daily as needed for anxiety.   anastrozole 1 MG tablet Commonly known as: ARIMIDEX Take 1 tablet (1 mg total) by mouth daily.   budesonide-formoterol 80-4.5 MCG/ACT inhaler Commonly known as: Symbicort INHALE 2 PUFFS BY MOUTH EVERY 12 HOURS TO PREVENT COUGH OR WHEEZING *RINSE MOUTH AFTER EACH USE*   busPIRone 30 MG tablet Commonly known as:  BUSPAR Take 30 mg by mouth 2 (two) times daily.   cefUROXime 500 MG tablet Commonly known as: CEFTIN Take 1 tablet (500 mg total) by mouth 2 (two) times daily with a meal for 4 days.   citalopram 20 MG tablet Commonly known as: CELEXA Take 1 tablet (20 mg total) by mouth daily.   desvenlafaxine 100 MG 24 hr tablet Commonly known as: PRISTIQ Take 100 mg by mouth daily.   Desvenlafaxine ER 100 MG Tb24 Take 1 tablet by mouth every morning.   diclofenac Sodium 1 % Gel Commonly known as: Voltaren Apply 2 g topically 3 (three) times daily as needed. For pain.   Empagliflozin-metFORMIN HCl ER 25-1000 MG Tb24 Take 1 tablet by mouth daily with breakfast.   famotidine 20 MG tablet Commonly known as: PEPCID Take 1 tablet (20 mg total) by mouth 2 (two) times daily. What changed: See the new instructions.   FLUoxetine 40 MG capsule Commonly known as: PROZAC Take 40 mg by mouth daily.   Global Inject Ease Insulin Syr 30G X 1/2" 1 ML Misc Generic drug: Insulin Syringe-Needle U-100 See  admin instructions.   glucose blood test strip Use as instructed   HUMULIN R 500 UNIT/ML injection Generic drug: insulin regular human CONCENTRATED Inject 30 Units into the skin 3 (three) times daily with meals.   Insulin Pen Needle 32G X 4 MM Misc Used to give insulin injections twice daily.   UltiCare Mini Pen Needles 31G X 6 MM Misc Generic drug: Insulin Pen Needle USE AS DIRECTED 3 TIMES DAILY   mupirocin ointment 2 % Commonly known as: BACTROBAN Apply 1 application topically 2 (two) times daily. Apply to the affected area 2 times a day   oxyCODONE-acetaminophen 5-325 MG tablet Commonly known as: PERCOCET/ROXICET Take 1 tablet by mouth every 4 (four) hours as needed for up to 1 day for severe pain.   OXYGEN Inhale 2-4 L into the lungs 2 (two) times daily as needed (shortness of breath).   pregabalin 300 MG capsule Commonly known as: LYRICA Take 1 capsule (300 mg total) by mouth 2  (two) times daily. For nerve pain.   ReliOn Ultra Thin Lancets 30G Misc Use as directed for blood sugar checks.   rosuvastatin 20 MG tablet Commonly known as: CRESTOR Take 1 tablet (20 mg total) by mouth daily. For cholesterol.   traZODone 100 MG tablet Commonly known as: DESYREL Take 200 mg by mouth at bedtime.       ASK your doctor about these medications    albuterol 108 (90 Base) MCG/ACT inhaler Commonly known as: VENTOLIN HFA Inhale 2 puffs into the lungs every 6 (six) hours as needed for wheezing or shortness of breath.   aspirin EC 81 MG tablet Take 81 mg by mouth daily before breakfast.   rOPINIRole 1 MG tablet Commonly known as: REQUIP TAKE 1 TABLET BY MOUTH EVERY NIGHT AT BEDTIME FOR RESTLESS LEGS.        Follow-up Information     Pleas Koch, NP Follow up in 1 week(s).   Specialty: Internal Medicine Contact information: [place] [address] [phone]                Allergies  Allergen Reactions   Adhesive [Tape] Rash and Other (See Comments)    TAKES OFF THE SKIN (CERTAIN MEDICAL TAPES DO THIS!!)   Metoprolol Shortness Of Breath    Occurrence of shortness of breath after 3 days   Montelukast Shortness Of Breath   Morphine Sulfate Anaphylaxis, Shortness Of Breath and Nausea And Vomiting    Swollen Throat - Able to tolerate dilaudid   Penicillins Anaphylaxis, Hives and Shortness Of Breath    Throat swells Has patient had a PCN reaction causing immediate rash, facial/tongue/throat swelling, SOB or lightheadedness with hypotension: Yes Has patient had a PCN reaction causing severe rash involving mucus membranes or skin necrosis: No Has patient had a PCN reaction that required hospitalization: Yes Has patient had a PCN reaction occurring within the last 10 years: No If all of the above answers are "NO", then may proceed with Cephalosporin use.    Diltiazem Swelling   Gabapentin Swelling   Midodrine     Lightheaded  and falling down    Consultations:    Procedures/Studies: US Venous Img Lower Unilateral Left (DVT)  Result Date: 01/13/2021 CLINICAL DATA:  Left lower extremity swelling X 1 day. EXAM: LEFT LOWER EXTREMITY VENOUS DOPPLER ULTRASOUND TECHNIQUE: Gray-scale sonography with compression, as well as color and duplex ultrasound, were performed to evaluate the deep venous system(s) from the level of the common femoral  vein through the popliteal and proximal calf veins. COMPARISON:  None. FINDINGS: VENOUS Normal compressibility of the common femoral, superficial femoral, and popliteal veins, as well as the visualized calf veins. Visualized portions of profunda femoral vein and great saphenous vein unremarkable. No filling defects to suggest DVT on grayscale or color Doppler imaging. Doppler waveforms show normal direction of venous flow, normal respiratory plasticity and response to augmentation. Limited views of the contralateral common femoral vein are unremarkable. OTHER None. Limitations: none IMPRESSION: No deep venous thrombosis of the left lower extremity. Electronically Signed   By: Iven Finn M.D.   On: 01/13/2021 19:37   DG Foot Complete Left  Result Date: 01/13/2021 CLINICAL DATA:  Foot infection EXAM: LEFT FOOT - COMPLETE 3+ VIEW COMPARISON:  None. FINDINGS: There is no evidence of fracture or dislocation. There is no evidence of arthropathy or other focal bone abnormality. Soft tissues are unremarkable. Large calcaneal enthesophytes. No osteolysis. IMPRESSION: Negative. Electronically Signed   By: Ulyses Jarred M.D.   On: 01/13/2021 19:15      Subjective: Feels better. Bg controlled. Reports legs feel better  Discharge Exam: Vitals:   01/17/21 0518 01/17/21 0746  BP: 135/67 (!) 122/59  Pulse: 72 72  Resp: 17 17  Temp: 98.2 F (36.8 C) (!) 97.5 F (36.4 C)  SpO2: 94% 93%   Vitals:   01/16/21 1537 01/16/21 2041 01/17/21 0518 01/17/21 0746  BP: (!) 111/58 122/61 135/67 (!)  122/59  Pulse: 69 70 72 72  Resp: 17 18 17 17   Temp: 98.9 F (37.2 C) 97.6 F (36.4 C) 98.2 F (36.8 C) (!) 97.5 F (36.4 C)  TempSrc:   Oral   SpO2: 95% 97% 94% 93%  Weight:      Height:        General: Pt is alert, awake, not in acute distress Cardiovascular: RRR, S1/S2 +, no rubs, no gallops Respiratory: CTA bilaterally, no wheezing, no rhonchi Abdominal: Soft, NT, ND, bowel sounds + Extremities: decrease erythema and swelling. Heel with less erythema.     The results of significant diagnostics from this hospitalization (including imaging, microbiology, ancillary and laboratory) are listed below for reference.     Microbiology: Recent Results (from the past 240 hour(s))  SARS CORONAVIRUS 2 (TAT 6-24 HRS) Nasopharyngeal Nasopharyngeal Swab     Status: None   Collection Time: 01/13/21  8:56 PM   Specimen: Nasopharyngeal Swab  Result Value Ref Range Status   SARS Coronavirus 2 NEGATIVE NEGATIVE Final    Comment: (NOTE) SARS-CoV-2 target nucleic acids are NOT DETECTED.  The SARS-CoV-2 RNA is generally detectable in upper and lower respiratory specimens during the acute phase of infection. Negative results do not preclude SARS-CoV-2 infection, do not rule out co-infections with other pathogens, and should not be used as the sole basis for treatment or other patient management decisions. Negative results must be combined with clinical observations, patient history, and epidemiological information. The expected result is Negative.  Fact Sheet for Patients: SugarRoll.be  Fact Sheet for Healthcare Providers: https://www.woods-mathews.com/  This test is not yet approved or cleared by the [place] FDA and  has been authorized for detection and/or diagnosis of SARS-CoV-2 by FDA under an Emergency Use Authorization (EUA). This EUA will remain  in effect (meaning this test can be used) for the duration of the COVID-19 declaration  under Se ction 564(b)(1) of the Act, 21 U.S.C. section 360bbb-3(b)(1), unless the authorization is terminated or revoked sooner.  Performed at [facility],  [address], [place], [place] [address]      Labs: BNP (last 3 results) No results for input(s): BNP in the last 8760 hours. Basic Metabolic Panel: Recent Labs  Lab 01/13/21 1505 01/14/21 0617 01/15/21 0634 01/16/21 0916 01/17/21 0452  NA 134* 137 134* 133*  --   K 3.6 3.4* 4.0 4.5  --   CL 92* 96* 94* 95*  --   CO2 30 32 29 29  --   GLUCOSE 194* 239* 355* 499*  --   BUN 23* 20 29* 28*  --   CREATININE 1.14* 1.04* 1.46*  1.41* 1.15* 1.18*  CALCIUM 9.0 8.9 8.8* 8.9  --    Liver Function Tests: Recent Labs  Lab 01/13/21 1505  AST 22  ALT 20  ALKPHOS 142*  BILITOT 0.9  PROT 8.1  ALBUMIN 3.5   No results for input(s): LIPASE, AMYLASE in the last 168 hours. No results for input(s): AMMONIA in the last 168 hours. CBC: Recent Labs  Lab 01/13/21 1505 01/14/21 0617  WBC 10.2 7.9  NEUTROABS 7.7  --   HGB 12.4 12.2  HCT 37.8 36.8  MCV 84.6 85.8  PLT 155 139*   Cardiac Enzymes: No results for input(s): CKTOTAL, CKMB, CKMBINDEX, TROPONINI in the last 168 hours. BNP: Invalid input(s): POCBNP CBG: Recent Labs  Lab 01/16/21 1324 01/16/21 1446 01/16/21 1651 01/16/21 2050 01/17/21 0805  GLUCAP 439* 394* 305* 275* 217*   D-Dimer No results for input(s): DDIMER in the last 72 hours. Hgb A1c No results for input(s): HGBA1C in the last 72 hours. Lipid Profile No results for input(s): CHOL, HDL, LDLCALC, TRIG, CHOLHDL, LDLDIRECT in the last 72 hours. Thyroid function studies No results for input(s): TSH, T4TOTAL, T3FREE, THYROIDAB in the last 72 hours.  Invalid input(s): FREET3 Anemia work up No results for input(s): VITAMINB12, FOLATE, FERRITIN, TIBC, IRON, RETICCTPCT in the last 72 hours. Urinalysis    Component Value Date/Time   COLORURINE STRAW (A) 01/14/2021 1000   APPEARANCEUR CLEAR (A)  01/14/2021 1000   LABSPEC 1.004 (L) 01/14/2021 1000   PHURINE 5.0 01/14/2021 1000   GLUCOSEU NEGATIVE 01/14/2021 1000   HGBUR SMALL (A) 01/14/2021 1000   HGBUR negative 03/07/2008 1643   BILIRUBINUR NEGATIVE 01/14/2021 1000   BILIRUBINUR neg 07/01/2020 0858   KETONESUR NEGATIVE 01/14/2021 1000   PROTEINUR NEGATIVE 01/14/2021 1000   UROBILINOGEN 0.2 07/01/2020 0858   UROBILINOGEN 0.2 10/08/2011 1850   NITRITE NEGATIVE 01/14/2021 1000   LEUKOCYTESUR NEGATIVE 01/14/2021 1000   Sepsis Labs Invalid input(s): PROCALCITONIN,  WBC,  LACTICIDVEN Microbiology Recent Results (from the past 240 hour(s))  SARS CORONAVIRUS 2 (TAT 6-24 HRS) Nasopharyngeal Nasopharyngeal Swab     Status: None   Collection Time: 01/13/21  8:56 PM   Specimen: Nasopharyngeal Swab  Result Value Ref Range Status   SARS Coronavirus 2 NEGATIVE NEGATIVE Final    Comment: (NOTE) SARS-CoV-2 target nucleic acids are NOT DETECTED.  The SARS-CoV-2 RNA is generally detectable in upper and lower respiratory specimens during the acute phase of infection. Negative results do not preclude SARS-CoV-2 infection, do not rule out co-infections with other pathogens, and should not be used as the sole basis for treatment or other patient management decisions. Negative results must be combined with clinical observations, patient history, and epidemiological information. The expected result is Negative.  Fact Sheet for Patients: SugarRoll.be  Fact Sheet for Healthcare Providers: https://www.woods-mathews.com/  This test is not yet approved or cleared by the [place] FDA and  has  been authorized for detection and/or diagnosis of SARS-CoV-2 by FDA under an Emergency Use Authorization (EUA). This EUA will remain  in effect (meaning this test can be used) for the duration of the COVID-19 declaration under Se ction 564(b)(1) of the Act, 21 U.S.C. section 360bbb-3(b)(1), unless the  authorization is terminated or revoked sooner.  Performed at [facility], [place] [address], [place], [place] [address]      Time coordinating discharge: Over 30 minutes  SIGNED:   Nolberto Hanlon, MD  [facility] 01/17/2021, 9:58 AM Pager   If 7PM-7AM, please contact night-coverage [email or web address] Password [email or web address]

## 2021-01-17 NOTE — Progress Notes (Signed)
IV removed, d/c education provided, all questions answered. Pt aware of meds at pharmacy. Pt has all belongings. Volunteers transported by [facility] to family car.

## 2021-01-18 ENCOUNTER — Other Ambulatory Visit: Payer: Self-pay

## 2021-01-18 DIAGNOSIS — I5033 Acute on chronic diastolic (congestive) heart failure: Secondary | ICD-10-CM

## 2021-01-18 MED FILL — Insulin Regular (Human) Inj 100 Unit/ML: INTRAMUSCULAR | Qty: 0.04 | Status: AC

## 2021-01-19 ENCOUNTER — Ambulatory Visit: Payer: Medicare Other | Admitting: Occupational Therapy

## 2021-01-19 ENCOUNTER — Telehealth: Payer: Self-pay | Admitting: *Deleted

## 2021-01-19 NOTE — Chronic Care Management (AMB) (Signed)
  Chronic Care Management   Note  01/19/2021 Name: [name] MRN: [id number] DOB: May 05, 1971  [name] is a 50 y.o. year old female who is a primary care patient of Pleas Koch, NP. I reached out to [employer] by phone today in response to a referral sent by [name]'s PCP Pleas Koch, NP     [name] was given information about Chronic Care Management services today including:  CCM service includes personalized support from designated clinical staff supervised by her physician, including individualized plan of care and coordination with other care providers 24/7 contact phone numbers for assistance for urgent and routine care needs. Service will only be billed when office clinical staff spend 20 minutes or more in a month to coordinate care. Only one practitioner may furnish and bill the service in a calendar month. The patient may stop CCM services at any time (effective at the end of the month) by phone call to the office staff. The patient will be responsible for cost sharing (co-pay) of up to 20% of the service fee (after annual deductible is met).  Patient agreed to services and verbal consent obtained.   Follow up plan: Telephone appointment with care management team member scheduled for:02/15/2021  Julian Hy, [place] [facility]  Direct Dial: [phone]

## 2021-01-20 ENCOUNTER — Telehealth: Payer: Self-pay

## 2021-01-20 ENCOUNTER — Emergency Department
Admission: EM | Admit: 2021-01-20 | Discharge: 2021-01-20 | Disposition: A | Discharge status: Home / Self Care | Payer: Medicare Other | Attending: Emergency Medicine | Admitting: Emergency Medicine

## 2021-01-20 ENCOUNTER — Emergency Department: Payer: Medicare Other

## 2021-01-20 ENCOUNTER — Other Ambulatory Visit: Payer: Self-pay

## 2021-01-20 DIAGNOSIS — R0602 Shortness of breath: Secondary | ICD-10-CM | POA: Insufficient documentation

## 2021-01-20 DIAGNOSIS — L089 Local infection of the skin and subcutaneous tissue, unspecified: Secondary | ICD-10-CM | POA: Insufficient documentation

## 2021-01-20 DIAGNOSIS — S90822A Blister (nonthermal), left foot, initial encounter: Secondary | ICD-10-CM | POA: Diagnosis not present

## 2021-01-20 DIAGNOSIS — Z5321 Procedure and treatment not carried out due to patient leaving prior to being seen by health care provider: Secondary | ICD-10-CM | POA: Diagnosis not present

## 2021-01-20 DIAGNOSIS — L03116 Cellulitis of left lower limb: Secondary | ICD-10-CM | POA: Insufficient documentation

## 2021-01-20 DIAGNOSIS — X58XXXA Exposure to other specified factors, initial encounter: Secondary | ICD-10-CM | POA: Insufficient documentation

## 2021-01-20 DIAGNOSIS — I509 Heart failure, unspecified: Secondary | ICD-10-CM | POA: Diagnosis not present

## 2021-01-20 DIAGNOSIS — I517 Cardiomegaly: Secondary | ICD-10-CM | POA: Diagnosis not present

## 2021-01-20 DIAGNOSIS — S99922A Unspecified injury of left foot, initial encounter: Secondary | ICD-10-CM | POA: Diagnosis present

## 2021-01-20 LAB — BASIC METABOLIC PANEL
Anion gap: 9 (ref 5–15)
BUN: 27 mg/dL — ABNORMAL HIGH (ref 6–20)
CO2: 26 mmol/L (ref 22–32)
Calcium: 9.5 mg/dL (ref 8.9–10.3)
Chloride: 98 mmol/L (ref 98–111)
Creatinine, Ser: 0.97 mg/dL (ref 0.44–1.00)
GFR, Estimated: >60 mL/min (ref >60)
Glucose, Bld: 345 mg/dL — ABNORMAL HIGH (ref 70–99)
Potassium: 4.6 mmol/L (ref 3.5–5.1)
Sodium: 133 mmol/L — ABNORMAL LOW (ref 135–145)

## 2021-01-20 LAB — CBC
HCT: 38.4 % (ref 36.0–46.0)
Hemoglobin: 12.8 g/dL (ref 12.0–15.0)
MCH: 28.1 pg (ref 26.0–34.0)
MCHC: 33.3 g/dL (ref 30.0–36.0)
MCV: 84.4 fL (ref 80.0–100.0)
Platelets: 151 10*3/uL (ref 150–400)
RBC: 4.55 MIL/uL (ref 3.87–5.11)
RDW: 16.4 % — ABNORMAL HIGH (ref 11.5–15.5)
WBC: 9.2 10*3/uL (ref 4.0–10.5)
nRBC: 0 % (ref 0.0–0.2)

## 2021-01-20 LAB — LACTIC ACID, PLASMA: Lactic Acid, Venous: 0.9 mmol/L (ref 0.5–1.9)

## 2021-01-20 NOTE — ED Triage Notes (Signed)
Pt comes into the ED via POV c/o infection to the left foot as well as concerns for fluid on her lungs.  PT seen here the other day for extensive cellulitis of the left leg and blistered wound on the heel of the left foot.  Pt states she has some SHOB, but denies any chest pain.  S she does have h/o of CHF.  Pt struggles to answer questions independently at this time, and states her husband is her caregiver. Pt presents with even and unlabored respirations.

## 2021-01-20 NOTE — Telephone Encounter (Signed)
Does [name] know that patient follows with cardiology?  She can contact cardiology regarding some of these questions including spironolactone and torsemide as they prescribe. The patient is actually overdue for cardiology evaluation.  Her symptoms could be secondary to the cellulitis, but also from volume overload due to lack of torsemide and spironolactone.  Agree with ED evaluation if she is declining posthospitalization.

## 2021-01-20 NOTE — Telephone Encounter (Signed)
[facility] said she saw pt earlier today and pt was d/c from [facility] on 01/17/21 with cellulitis of lt leg. While in hospital pt was given IV abx and sent home on oral abx that should be finished on 01/21/21.while in hospital pt had med changes and torsemide and spironolactone was stopped. Pts wt on 01/13/21 was 248.8 lbs and on 01/20/21 pt s wt is 260.8.pt said lt leg is warm to touch, the lt leg is redder than when pt was D/C on 01/17/21.also blister on lt heel popped and is discolored. BP today was 159/85 P 74. [name] said both legs are swollen, lungs has wheezing, pt having more trouble breathing than normal and pt is using oxygen all the time. Pt also has chest tightness with any walking. [name] wants to know if Gentry Fitz NP can add pt on to her schedule today. Spoke with joellen CMA and does not have any more capability of adding more pts this afternoon.Anda Kraft has already added on pts today already). [name] notified as instructed and [name] will contact pt to go to ED. Sending note to Gentry Fitz NP and [facility] CMA.

## 2021-01-20 NOTE — Telephone Encounter (Signed)
Noted  

## 2021-01-22 ENCOUNTER — Emergency Department
Admission: EM | Admit: 2021-01-22 | Discharge: 2021-01-23 | Disposition: A | Discharge status: Home / Self Care | Payer: Medicare Other | Attending: Emergency Medicine | Admitting: Emergency Medicine

## 2021-01-22 ENCOUNTER — Encounter: Payer: Self-pay | Admitting: Emergency Medicine

## 2021-01-22 ENCOUNTER — Other Ambulatory Visit: Payer: Self-pay

## 2021-01-22 DIAGNOSIS — L03116 Cellulitis of left lower limb: Secondary | ICD-10-CM | POA: Insufficient documentation

## 2021-01-22 DIAGNOSIS — E1122 Type 2 diabetes mellitus with diabetic chronic kidney disease: Secondary | ICD-10-CM | POA: Insufficient documentation

## 2021-01-22 DIAGNOSIS — F17210 Nicotine dependence, cigarettes, uncomplicated: Secondary | ICD-10-CM | POA: Insufficient documentation

## 2021-01-22 DIAGNOSIS — Z794 Long term (current) use of insulin: Secondary | ICD-10-CM | POA: Diagnosis not present

## 2021-01-22 DIAGNOSIS — J449 Chronic obstructive pulmonary disease, unspecified: Secondary | ICD-10-CM | POA: Diagnosis not present

## 2021-01-22 DIAGNOSIS — Z853 Personal history of malignant neoplasm of breast: Secondary | ICD-10-CM | POA: Insufficient documentation

## 2021-01-22 DIAGNOSIS — I130 Hypertensive heart and chronic kidney disease with heart failure and stage 1 through stage 4 chronic kidney disease, or unspecified chronic kidney disease: Secondary | ICD-10-CM | POA: Diagnosis not present

## 2021-01-22 DIAGNOSIS — R52 Pain, unspecified: Secondary | ICD-10-CM | POA: Diagnosis not present

## 2021-01-22 DIAGNOSIS — Z743 Need for continuous supervision: Secondary | ICD-10-CM | POA: Diagnosis not present

## 2021-01-22 DIAGNOSIS — I5032 Chronic diastolic (congestive) heart failure: Secondary | ICD-10-CM | POA: Insufficient documentation

## 2021-01-22 DIAGNOSIS — K529 Noninfective gastroenteritis and colitis, unspecified: Secondary | ICD-10-CM | POA: Diagnosis not present

## 2021-01-22 DIAGNOSIS — Z85828 Personal history of other malignant neoplasm of skin: Secondary | ICD-10-CM | POA: Diagnosis not present

## 2021-01-22 DIAGNOSIS — I2510 Atherosclerotic heart disease of native coronary artery without angina pectoris: Secondary | ICD-10-CM | POA: Diagnosis not present

## 2021-01-22 DIAGNOSIS — Z79899 Other long term (current) drug therapy: Secondary | ICD-10-CM | POA: Insufficient documentation

## 2021-01-22 DIAGNOSIS — N183 Chronic kidney disease, stage 3 unspecified: Secondary | ICD-10-CM | POA: Insufficient documentation

## 2021-01-22 DIAGNOSIS — J45909 Unspecified asthma, uncomplicated: Secondary | ICD-10-CM | POA: Diagnosis not present

## 2021-01-22 DIAGNOSIS — M79673 Pain in unspecified foot: Secondary | ICD-10-CM | POA: Diagnosis not present

## 2021-01-22 DIAGNOSIS — R238 Other skin changes: Secondary | ICD-10-CM | POA: Diagnosis not present

## 2021-01-22 DIAGNOSIS — R739 Hyperglycemia, unspecified: Secondary | ICD-10-CM | POA: Diagnosis not present

## 2021-01-22 LAB — CBC WITH DIFFERENTIAL/PLATELET
Abs Immature Granulocytes: 0.04 10*3/uL (ref 0.00–0.07)
Basophils Absolute: 0.1 10*3/uL (ref 0.0–0.1)
Basophils Relative: 1 %
Eosinophils Absolute: 0.3 10*3/uL (ref 0.0–0.5)
Eosinophils Relative: 3 %
HCT: 34.7 % — ABNORMAL LOW (ref 36.0–46.0)
Hemoglobin: 11.4 g/dL — ABNORMAL LOW (ref 12.0–15.0)
Immature Granulocytes: 1 %
Lymphocytes Relative: 15 %
Lymphs Abs: 1.1 10*3/uL (ref 0.7–4.0)
MCH: 28.3 pg (ref 26.0–34.0)
MCHC: 32.9 g/dL (ref 30.0–36.0)
MCV: 86.1 fL (ref 80.0–100.0)
Monocytes Absolute: 0.5 10*3/uL (ref 0.1–1.0)
Monocytes Relative: 7 %
Neutro Abs: 5.7 10*3/uL (ref 1.7–7.7)
Neutrophils Relative %: 73 %
Platelets: 148 10*3/uL — ABNORMAL LOW (ref 150–400)
RBC: 4.03 MIL/uL (ref 3.87–5.11)
RDW: 16.1 % — ABNORMAL HIGH (ref 11.5–15.5)
WBC: 7.7 10*3/uL (ref 4.0–10.5)
nRBC: 0 % (ref 0.0–0.2)

## 2021-01-22 LAB — BASIC METABOLIC PANEL
Anion gap: 9 (ref 5–15)
BUN: 30 mg/dL — ABNORMAL HIGH (ref 6–20)
CO2: 29 mmol/L (ref 22–32)
Calcium: 9 mg/dL (ref 8.9–10.3)
Chloride: 93 mmol/L — ABNORMAL LOW (ref 98–111)
Creatinine, Ser: 1.16 mg/dL — ABNORMAL HIGH (ref 0.44–1.00)
GFR, Estimated: 57 mL/min — ABNORMAL LOW (ref >60)
Glucose, Bld: 409 mg/dL — ABNORMAL HIGH (ref 70–99)
Potassium: 4.2 mmol/L (ref 3.5–5.1)
Sodium: 131 mmol/L — ABNORMAL LOW (ref 135–145)

## 2021-01-22 LAB — LACTIC ACID, PLASMA: Lactic Acid, Venous: 0.9 mmol/L (ref 0.5–1.9)

## 2021-01-22 MED ORDER — CLINDAMYCIN HCL 300 MG PO CAPS: 300.0000 mg | ORAL_CAPSULE | Freq: Three times a day (TID) | ORAL | 0 refills | Status: AC

## 2021-01-22 MED ORDER — VANCOMYCIN HCL IN DEXTROSE 1-5 GM/200ML-% IV SOLN
1000.0000 mg | Freq: Once | INTRAVENOUS | Status: AC
  Administered 2021-01-22: 1000 mg via INTRAVENOUS
  Filled 2021-01-22: qty 200

## 2021-01-22 MED ORDER — OXYCODONE-ACETAMINOPHEN 5-325 MG PO TABS
1.0000 | ORAL_TABLET | Freq: Once | ORAL | Status: AC
  Administered 2021-01-22: 1 via ORAL
  Filled 2021-01-22: qty 1

## 2021-01-22 NOTE — ED Provider Notes (Addendum)
[facility] Emergency Department Provider Note  Time seen: 11:27 PM  I have reviewed the triage vital signs and the nursing notes.   HISTORY  Chief Complaint Blister   HPI [name] is a 50 y.o. female with a past medical history of anxiety, bipolar, CHF, CKD, COPD, diabetes, hypertension, hyperlipidemia presents to the emergency department for evaluation of a left leg wound.  According to the patient and record review patient was discharged from the hospital 01/17/2021 after an admission for cellulitis where she had a large blister to her heel and extending erythema and tenderness of the leg of the left lower extremity.  Patient is taking Ceftin at home but states she felt like the redness was increasing so she came to the emergency department for evaluation.   Past Medical History:  Diagnosis Date   Anxiety    takes Prozac daily   Anxiety    Aortic valve calcification    Asthma    Advair and Spirva daily   Asthma    Bipolar disorder (HCC)    CAD (coronary artery disease)    a. LHC 11/2013 done for CP/fluid retention: mild disease in prox LAD, mild-mod disease in mRCA, EF 60% with normal LVEDP. b. Normal nuc 03/2016.   Cancer (HCC)    CHF (congestive heart failure) (HCC)    Chronic diastolic CHF (congestive heart failure) (HCC)    Chronic heart failure with preserved ejection fraction ([place]) 11/16/2018   CKD (chronic kidney disease), stage II    COPD (chronic obstructive pulmonary disease) (HCC)    a. nocturnal O2.   COPD (chronic obstructive pulmonary disease) (HCC)    Coronary artery disease    Decreased urine stream    Diabetes mellitus    Diabetes mellitus without complication (HCC)    Dyspnea    Family history of adverse reaction to anesthesia    mom gets nauseated   GERD (gastroesophageal reflux disease)    takes Pepcid daily   History of blood clots    left leg 3-19yrs ago   Hyperlipidemia    Hypertension    Hypertriglyceridemia     Inguinal hernia, left 01/2015   Muscle spasm    Open wound of genital labia    Peripheral neuropathy    RBBB    Seizures ([place])    Sepsis ([place]) 01/19/2018   Sinus tachycardia    a. persistent since 2009.   Smokers' cough ([place])    Stroke ([place]) 1989   left sided weakness   TIA (transient ischemic attack)    Tobacco abuse    Vulvar abscess 01/23/2018    Patient Active Problem List   Diagnosis Date Noted   Cellulitis of lower extremity 01/14/2021   Hyperlipidemia associated with type 2 diabetes mellitus ([place]) 01/13/2021   Fatigue 12/01/2020   Acute on chronic diastolic heart failure ([place]) 11/16/2020   Chronic migraine without aura without status migrainosus, not intractable 10/01/2020   Dizziness 10/01/2020   Acute midline low back pain without sciatica 09/01/2020   Microscopic hematuria 07/01/2020   Pulmonary nodules 07/01/2020   Hav (hallux abducto valgus), unspecified laterality 06/11/2020   Venous ulcer of ankle, left ([place]) 05/15/2020   Contracture of hand joint 05/15/2020   Cellulitis and abscess of trunk 03/12/2020   Status post mastectomy, right 01/14/2020   Acute kidney injury superimposed on CKD [facility])    Breast cancer, right breast ([place]) 12/23/2019   Hyperlipidemia    Carcinoma of lower-outer quadrant of right breast in female, estrogen  receptor positive ([place]) 11/12/2019   Restrictive lung disease 10/08/2019   Postmenopausal bleeding 08/30/2019   Carotid stenosis, asymptomatic, right 08/16/2019   Nausea and vomiting 07/15/2019   Cellulitis of left lower extremity 06/04/2019   Multiple wounds of skin 06/04/2019   Other injury of unspecified body region, initial encounter 06/04/2019   (HFpEF) heart failure with preserved ejection fraction ([place]) 11/16/2018   Left-sided weakness 09/13/2018   Weakness of left lower extremity 09/05/2018   Recurrent falls 09/05/2018   PAD (peripheral artery disease) ([place]) 08/27/2018   Chronic congestive heart failure ([place]) 08/02/2018    Vaginal itching 06/15/2018   Foot pain, bilateral 05/22/2018   Chronic upper extremity pain (Secondary Area of Pain) (Bilateral) 04/23/2018   Diabetic polyneuropathy associated with type 2 diabetes mellitus ([place]) 04/23/2018   Long term prescription benzodiazepine use 04/23/2018   Neurogenic pain 04/23/2018   Vitamin D insufficiency 01/29/2018   Elevated C-reactive protein (CRP) 01/29/2018   Elevated sedimentation rate 01/29/2018   Pressure injury of skin 01/29/2018   Insulin dependent type 2 diabetes mellitus ([place]) 01/23/2018   DNR (do not resuscitate) 01/23/2018   CKD stage 3 due to type 2 diabetes mellitus ([place]) 01/23/2018   IBS (irritable bowel syndrome) 01/19/2018   Elevated serum hCG 01/19/2018   Chronic lower extremity pain (Primary Area of Pain) (Bilateral) 01/08/2018   Chronic low back pain [facility]) (Bilateral) w/ sciatica (Bilateral) 01/08/2018   Chronic pain syndrome 01/08/2018   Pharmacologic therapy 01/08/2018   Disorder of skeletal system 01/08/2018   Problems influencing health status 01/08/2018   Long term current use of opiate analgesic 01/08/2018   Restless leg syndrome 08/02/2017   Nocturnal hypoxemia 03/29/2017   Vision disturbance 02/28/2017   CKD (chronic kidney disease), stage II 02/28/2017   Visual loss, bilateral    Chronic respiratory failure with hypoxia (HCC)/ nocturnal 02 dep  10/28/2016   Upper airway cough syndrome 08/22/2016   Cigarette smoker 06/15/2016   Simple chronic bronchitis ([place])    Coronary artery disease involving native heart without angina pectoris 05/16/2016   Chronic diastolic CHF (congestive heart failure) ([place]) 11/04/2015   Orthopnea 11/03/2015   Bilateral leg edema    Diabetes ([place])    COPD exacerbation ([place]) 10/15/2015   Fracture of rib, closed 10/15/2015   Venous stasis of both lower extremities 03/29/2015   Preventative health care 02/04/2015   Hypertension associated with diabetes ([place]) 01/02/2015   Inguinal  hernia 11/27/2014   Allergic rhinitis with postnasal drip 01/21/2014   Aortic valve disorders 04/18/2013   Sleep apnea 05/22/2012   Syncope and collapse 10/09/2011   Seizure disorder ([place]) 10/09/2011   Bipolar disorder ([place]) 10/09/2011   TIA (transient ischemic attack) 06/22/2011   Constipation 06/15/2011   HYPERTRIGLYCERIDEMIA 07/17/2007   Situational anxiety 05/30/2007   COPD with chronic bronchitis ([place]) 05/30/2007   Morbid (severe) obesity due to excess calories ([place]) 04/23/2007   Tobacco abuse 09/07/2006    Past Surgical History:  Procedure Laterality Date   APPLICATION OF WOUND VAC Right 01/29/2020   Procedure: APPLICATION OF WOUND VAC;  Surgeon: Ronny Bacon, MD;  Location: [facility] ORS;  Service: General;  Laterality: Right;   BREAST BIOPSY Right 11/06/2019   [place] core path pending venus clip   HERNIA REPAIR Left    INCISION AND DRAINAGE ABSCESS N/A 01/26/2018   Procedure: INCISION AND DEBRIDEMENT OF VULVAR NECROTIZING SOFT TISSUE INFECTION;  Surgeon: Greer Pickerel, MD;  Location: [place];  Service: General;  Laterality: N/A;   INCISION  AND DRAINAGE PERIRECTAL ABSCESS N/A 01/22/2018   Procedure: IRRIGATION AND DEBRIDEMENT LABIAL/VULVAR AREA;  Surgeon: Coralie Keens, MD;  Location: [place];  Service: General;  Laterality: N/A;   INCISION AND DRAINAGE PERIRECTAL ABSCESS N/A 01/29/2018   Procedure: IRRIGATION AND DEBRIDEMENT VULVA;  Surgeon: Excell Seltzer, MD;  Location: [place];  Service: General;  Laterality: N/A;   INGUINAL HERNIA REPAIR Left 04/08/2015   Procedure: OPEN LEFT INGUINAL HERNIA REPAIR WITH MESH;  Surgeon: Ralene Ok, MD;  Location: [place];  Service: General;  Laterality: Left;   INSERTION OF MESH Left 04/08/2015   Procedure: INSERTION OF MESH;  Surgeon: Ralene Ok, MD;  Location: [place];  Service: General;  Laterality: Left;   LAPAROSCOPY     Endometriosis   LEFT HEART CATHETERIZATION WITH CORONARY ANGIOGRAM N/A 12/05/2013   Procedure: LEFT HEART  CATHETERIZATION WITH CORONARY ANGIOGRAM;  Surgeon: Jettie Booze, MD;  Location: [facility] CATH LAB;  Service: Cardiovascular;  Laterality: N/A;   MASTECTOMY Right    right kidney drained     SIMPLE MASTECTOMY WITH AXILLARY SENTINEL NODE BIOPSY Right 12/23/2019   Procedure: Right SIMPLE MASTECTOMY WITH AXILLARY SENTINEL NODE BIOPSY;  Surgeon: Ronny Bacon, MD;  Location: [facility];  Service: General;  Laterality: Right;   TEE WITHOUT CARDIOVERSION N/A 11/28/2013   Procedure: TRANSESOPHAGEAL ECHOCARDIOGRAM (TEE);  Surgeon: Thayer Headings, MD;  Location: [place];  Service: Cardiovascular;  Laterality: N/A;   WOUND DEBRIDEMENT Right 01/29/2020   Procedure: DEBRIDEMENT WOUND, Excisional debridement skin, subcutaneous and muscle right chest wall;  Surgeon: Ronny Bacon, MD;  Location: [facility] OR[facility];  Service: General;  Laterality: Right;    Prior to Admission medications   Medication Sig Start Date End Date Taking? Authorizing Provider  albuterol (VENTOLIN HFA) 108 (90 Base) MCG/ACT inhaler Inhale 2 puffs into the lungs every 6 (six) hours as needed for wheezing or shortness of breath. Patient not taking: Reported on 12/23/2020    [provider]  ALPRAZolam Duanne Moron) 1 MG tablet Take 1 mg by mouth 4 (four) times daily as needed for anxiety.  12/02/19   [provider]  anastrozole (ARIMIDEX) 1 MG tablet Take 1 tablet (1 mg total) by mouth daily. 06/29/20   Cammie Sickle, MD  aspirin EC 81 MG tablet Take 81 mg by mouth daily before breakfast.  Patient not taking: Reported on 01/13/2021    [provider]  budesonide-formoterol (SYMBICORT) 80-4.5 MCG/ACT inhaler INHALE 2 PUFFS BY MOUTH EVERY 12 HOURS TO PREVENT COUGH OR WHEEZING *RINSE MOUTH AFTER EACH USE* 12/01/20   Pleas Koch, NP  busPIRone (BUSPAR) 30 MG tablet Take 30 mg by mouth 2 (two) times daily.    [provider]  citalopram (CELEXA) 20 MG tablet Take 1 tablet (20 mg total) by mouth daily.  12/17/19   Cammie Sickle, MD  desvenlafaxine (PRISTIQ) 100 MG 24 hr tablet Take 100 mg by mouth daily.    [provider]  Desvenlafaxine ER 100 MG TB24 Take 1 tablet by mouth every morning. 12/18/20   [provider]  diclofenac Sodium (VOLTAREN) 1 % GEL Apply 2 g topically 3 (three) times daily as needed. For pain. 09/01/20   Pleas Koch, NP  Empagliflozin-metFORMIN HCl ER 25-1000 MG TB24 Take 1 tablet by mouth daily with breakfast. 12/03/20 12/03/21  [provider]  famotidine (PEPCID) 20 MG tablet Take 1 tablet (20 mg total) by mouth 2 (two) times daily. 01/17/21 02/16/21  Nolberto Hanlon, MD  FLUoxetine (PROZAC) 40 MG  capsule Take 40 mg by mouth daily. 10/27/20   [provider]  glucose blood test strip Use as instructed 12/25/20   Pleas Koch, NP  Insulin Pen Needle 32G X 4 MM MISC Used to give insulin injections twice daily. 08/23/17   Renato Shin, MD  insulin regular human CONCENTRATED (HUMULIN R) 500 UNIT/ML injection Inject 30 Units into the skin 3 (three) times daily with meals.     [provider]  Insulin Syringe-Needle U-100 (GLOBAL INJECT EASE INSULIN SYR) 30G X 1/2" 1 ML MISC See admin instructions. 05/22/20 05/22/21  [provider]  mupirocin ointment (BACTROBAN) 2 % Apply 1 application topically 2 (two) times daily. Apply to the affected area 2 times a day 09/29/20   Newt Minion, MD  OXYGEN Inhale 2-4 L into the lungs 2 (two) times daily as needed (shortness of breath).     [provider]  pregabalin (LYRICA) 300 MG capsule Take 1 capsule (300 mg total) by mouth 2 (two) times daily. For nerve pain. 11/25/20   Pleas Koch, NP  ReliOn Ultra Thin Lancets 30G MISC Use as directed for blood sugar checks. 12/25/20   Pleas Koch, NP  rOPINIRole (REQUIP) 1 MG tablet TAKE 1 TABLET BY MOUTH EVERY NIGHT AT BEDTIME FOR RESTLESS LEGS. Patient taking differently: Take 1 mg by mouth at bedtime. 06/08/20    Pleas Koch, NP  rosuvastatin (CRESTOR) 20 MG tablet Take 1 tablet (20 mg total) by mouth daily. For cholesterol. 08/18/20   Pleas Koch, NP  traZODone (DESYREL) 100 MG tablet Take 200 mg by mouth at bedtime.  11/08/18   [provider]  ULTICARE MINI PEN NEEDLES 31G X 6 MM MISC USE AS DIRECTED 3 TIMES DAILY 05/18/20   Renato Shin, MD    Allergies  Allergen Reactions   Adhesive [Tape] Rash and Other (See Comments)    TAKES OFF THE SKIN (CERTAIN MEDICAL TAPES DO THIS!!)   Metoprolol Shortness Of Breath    Occurrence of shortness of breath after 3 days   Montelukast Shortness Of Breath   Morphine Sulfate Anaphylaxis, Shortness Of Breath and Nausea And Vomiting    Swollen Throat - Able to tolerate dilaudid   Penicillins Anaphylaxis, Hives and Shortness Of Breath    Throat swells Has patient had a PCN reaction causing immediate rash, facial/tongue/throat swelling, SOB or lightheadedness with hypotension: Yes Has patient had a PCN reaction causing severe rash involving mucus membranes or skin necrosis: No Has patient had a PCN reaction that required hospitalization: Yes Has patient had a PCN reaction occurring within the last 10 years: No If all of the above answers are "NO", then may proceed with Cephalosporin use.    Diltiazem Swelling   Gabapentin Swelling   Midodrine     Lightheaded and falling down    Family History  Problem Relation Age of Onset   Venous thrombosis Brother    Other Brother        BRAIN TUMOR   Asthma Father    Diabetes Father    Coronary artery disease Mother    Hypertension Mother    Diabetes Mother    Breast cancer Mother 46   Asthma Sister    Diabetes type II Brother    Diabetes Brother     Social History Social History   Tobacco Use   Smoking status: Some Days    Packs/day: 0.25    Years: 36.00    Pack years: 9.00  Types: Cigarettes    Start date: 07/11/1981   Smokeless tobacco: Never   Tobacco comments:    1 ppd now   Vaping Use   Vaping Use: Some days  Substance Use Topics   Alcohol use: No   Drug use: No    Review of Systems Constitutional: Negative for fever.   Cardiovascular: Negative for chest pain. Respiratory: Negative for shortness of breath. Gastrointestinal: Negative for abdominal pain.  Negative for vomiting. Musculoskeletal: Pain in the lower extremity on the left side. Skin: Blister to left heel, redness to left lower extremity Neurological: Negative for headache All other ROS negative  ____________________________________________   PHYSICAL EXAM:  VITAL SIGNS: ED Triage Vitals  Enc Vitals Group     BP 01/22/21 1553 112/60     Pulse Rate 01/22/21 1553 69     Resp 01/22/21 1553 18     Temp 01/22/21 1553 98.7 F (37.1 C)     Temp Source 01/22/21 1553 Oral     SpO2 01/22/21 1553 94 %     Weight 01/22/21 1554 260 lb (117.9 kg)     Height 01/22/21 1554 5\' 4"  (1.626 m)     Head Circumference --      Peak Flow --      Pain Score 01/22/21 1554 7     Pain Loc --      Pain Edu? --      Excl. in [place]? --     Constitutional: Alert and oriented. Well appearing and in no distress. Eyes: Normal exam ENT      Head: Normocephalic and atraumatic.      Mouth/Throat: Mucous membranes are moist. Cardiovascular: Normal rate, regular rhythm.  Respiratory: Normal respiratory effort without tachypnea nor retractions. Breath sounds are clear Gastrointestinal: Soft and nontender. No distention.   Musculoskeletal: Patient has an approximate 6 cm blister to the left heel that is opened and draining.  She does have erythema extending up the left leg approximately halfway.  Patient states previously the redness was up to her knee but appears to have receded somewhat.  Neurologic:  Normal speech and language. No gross focal neurologic deficits Skin: Erythema halfway to the knee with a draining blister to the left heel Psychiatric: Mood and affect are normal.    INITIAL IMPRESSION / ASSESSMENT  AND PLAN / ED COURSE  Pertinent labs & imaging results that were available during my care of the patient were reviewed by me and considered in my medical decision making (see chart for details).   Patient presents emergency department for evaluation of her left heel wound and redness of her leg.  Patient states she is taking Ceftin since being discharged 01/17/2021.  Patient states she was concerned that the redness had increased however states now that it appears to be receding.  Patient's work-up today is reassuring with a normal white blood cell count, afebrile and a normal lactic acid.  Patient denies any fever at home at any time.  Denies any nausea or vomiting.  Patient had an ultrasound that was negative for DVT last week during her admission.  I do believe patient would benefit from broadening her antibiotic coverage to cover for MRSA.  We will change the patient's antibiotics to clindamycin.  We will dose a round of IV antibiotics in the emergency department and discharged home with PCP follow-up.  Discussed return precautions for any fever, increasing redness pain and swelling.  Patient agreeable to plan of care.  [name] was evaluated in  Emergency Department on 01/22/2021 for the symptoms described in the history of present illness. She was evaluated in the context of the global COVID-19 pandemic, which necessitated consideration that the patient might be at risk for infection with the SARS-CoV-2 virus that causes COVID-19. Institutional protocols and algorithms that pertain to the evaluation of patients at risk for COVID-19 are in a state of rapid change based on information released by regulatory bodies including the CDC and federal and state organizations. These policies and algorithms were followed during the patient's care in the ED.  ____________________________________________   FINAL CLINICAL IMPRESSION(S) / ED DIAGNOSES  Cellulitis   Harvest Dark, MD 01/22/21 1028     Harvest Dark, MD 01/22/21 [phone]

## 2021-01-22 NOTE — ED Triage Notes (Signed)
Pt in via [facility] from home with c/o a blister to her left heel. EMS reports blsiter is black in color. 122/70, HR 72, 94% RA, FSBS 577, 98.8 temp

## 2021-01-28 ENCOUNTER — Telehealth: Payer: Self-pay

## 2021-01-28 NOTE — Telephone Encounter (Signed)
[name] from [facility] reports she assessed pt today and pt has a blister, 2"x 4.5", draining brown fluid. Blister is not completely open but is draining a lot of fluid. Pt was prescribed clindamycin, 300mg , TID, but has had bad diarrhea from it and is only taking QD and still having 4 diarrhea today. [name] thinks pt should be seen by someone tomorrow whether in the office or if wound care can make it out before the weekend. Advised a msg would be sent to PCP and this office will f/u with pt.

## 2021-01-28 NOTE — Telephone Encounter (Signed)
Noted.  Common side effect from clindamycin, need to also consider c-diff.  This patient needs a hospital and ED follow up which will take more than 20 min.  The only option I have available for tomorrow (July 22) is 3:20 pm.

## 2021-01-29 ENCOUNTER — Other Ambulatory Visit: Payer: Self-pay

## 2021-01-29 ENCOUNTER — Ambulatory Visit (INDEPENDENT_AMBULATORY_CARE_PROVIDER_SITE_OTHER): Payer: Medicare Other | Admitting: Primary Care

## 2021-01-29 ENCOUNTER — Encounter: Payer: Self-pay | Admitting: Primary Care

## 2021-01-29 VITALS — BP 134/82 | HR 91 | Temp 98.6°F | Ht 64.0 in | Wt 242.0 lb

## 2021-01-29 DIAGNOSIS — I2510 Atherosclerotic heart disease of native coronary artery without angina pectoris: Secondary | ICD-10-CM

## 2021-01-29 DIAGNOSIS — L89629 Pressure ulcer of left heel, unspecified stage: Secondary | ICD-10-CM

## 2021-01-29 DIAGNOSIS — L8990 Pressure ulcer of unspecified site, unspecified stage: Secondary | ICD-10-CM | POA: Insufficient documentation

## 2021-01-29 DIAGNOSIS — L03116 Cellulitis of left lower limb: Secondary | ICD-10-CM

## 2021-01-29 MED ORDER — DOXYCYCLINE HYCLATE 100 MG PO TABS: 100.0000 mg | ORAL_TABLET | Freq: Two times a day (BID) | ORAL | 0 refills | Status: DC

## 2021-01-29 MED ORDER — OXYCODONE-ACETAMINOPHEN 5-325 MG PO TABS: 1.0000 | ORAL_TABLET | Freq: Three times a day (TID) | ORAL | 0 refills | Status: AC | PRN

## 2021-01-29 NOTE — Assessment & Plan Note (Signed)
Acute for the last month, little to no improvement.  Wound culture obtained today. X-ray from 2 weeks ago without evidence of osteomyelitis.  Unable to tolerate clindamycin antibiotics due to diarrhea.  Given her uncontrolled diabetes coupled with exam findings today we will cover with doxycycline twice daily x10 days while we wait for culture result.  Referral was placed for home health nursing and wound care center.  Return precautions provided.

## 2021-01-29 NOTE — Telephone Encounter (Signed)
Contacted pt and advised she needs an OV. Pt agreed to apt today at 320. Pt will be at office at  3pm. Inquired if pt needed anything before apt and pt denied needing anything. Advised if anything changed to contact the office. Pt verbalized understanding.

## 2021-01-29 NOTE — Progress Notes (Signed)
Subjective:    Patient ID: [name], female    DOB: 04/15/1971, 50 y.o.   MRN: [id number]  HPI  [name] is a very pleasant 50 y.o. female who presents today   She presented to [facility] ED on 01/13/21 for cellulitis to left lower extremity with swelling and redness tracking up her leg. Work  up consistent with cellulitis without osteomyelitis or DVT. She was treated with IV vancomycin and cefepime and admitted for further evaluation.  During her hospital stay she showed little improvement with cefepime so additional vancomycin was continued. Her torsemide and aldactone were held due to AKI. She showed improvement with treatment. She did have episodes of hyperglycemia, wasn't provided with enough insulin. She was discharged home on 01/17/21 with prescription for oral Ceftin.  She presented to [facility] ED again on 01/22/21 with reports of blister to left heel with erythema. She was compliant to Ceftin that was provided at discharge but felt this was ineffective due to her continued symptoms. Work up during ED stay with normal WBC count. She was without fever. Her antibiotics were changed to clindamycin 300 mg TID. She did receive IV antibiotic treatment during her stay. She was discharged home later that evening.  Since her discharge home she's had multiple episodes of diarrhea that began when taking clindamycin 300 mg TID. She took a total of 5 pills before discontinuation due to intolerance, this was one week ago. Her diarrhea subsided within 24 hours.   Since then her blister is getting worse with copious amounts of light brown discharge and moderate heel pain. She has a hospice nurse who was at her home yesterday who changed her dressing and noticed discharge. She changes her dressings once daily.   Her blood sugars are running "200's". Her left lower extremity cellulitis has resolved. She denies fevers. Se is requesting something for pain. Was provided with a few pills of Percocet at  discharge from her prior hospital stay with improvement temporarily.     Review of Systems  Constitutional:  Negative for chills and fever.  Skin:  Positive for wound.        Past Medical History:  Diagnosis Date   Anxiety    takes Prozac daily   Anxiety    Aortic valve calcification    Asthma    Advair and Spirva daily   Asthma    Bipolar disorder (HCC)    CAD (coronary artery disease)    a. LHC 11/2013 done for CP/fluid retention: mild disease in prox LAD, mild-mod disease in mRCA, EF 60% with normal LVEDP. b. Normal nuc 03/2016.   Cancer (HCC)    CHF (congestive heart failure) (HCC)    Chronic diastolic CHF (congestive heart failure) (HCC)    Chronic heart failure with preserved ejection fraction ([place]) 11/16/2018   CKD (chronic kidney disease), stage II    COPD (chronic obstructive pulmonary disease) (HCC)    a. nocturnal O2.   COPD (chronic obstructive pulmonary disease) (HCC)    Coronary artery disease    Decreased urine stream    Diabetes mellitus    Diabetes mellitus without complication (HCC)    Dyspnea    Family history of adverse reaction to anesthesia    mom gets nauseated   GERD (gastroesophageal reflux disease)    takes Pepcid daily   History of blood clots    left leg 3-62yr ago   Hyperlipidemia    Hypertension    Hypertriglyceridemia    Inguinal hernia, left 01/2015  Muscle spasm    Open wound of genital labia    Peripheral neuropathy    RBBB    Seizures ([place])    Sepsis ([place]) 01/19/2018   Sinus tachycardia    a. persistent since 2009.   Smokers' cough ([place])    Stroke ([place]) 1989   left sided weakness   TIA (transient ischemic attack)    Tobacco abuse    Vulvar abscess 01/23/2018    Social History   Socioeconomic History   Marital status: Married    Spouse name: Not on file   Number of children: 2   Years of education: Not on file   Highest education level: Not on file  Occupational History   Not on file  Tobacco Use   Smoking status:  Some Days    Packs/day: 0.25    Years: 36.00    Pack years: 9.00    Types: Cigarettes    Start date: 07/11/1981   Smokeless tobacco: Never   Tobacco comments:    1 ppd now  Vaping Use   Vaping Use: Some days  Substance and Sexual Activity   Alcohol use: No   Drug use: No   Sexual activity: Not Currently  Other Topics Concern   Not on file  Social History Narrative   ** Merged History Encounter **       Lives in [place]   Married but lives with Boyfriend - not legally separated   Disabled - for BiPolar, Seizure disorder, diabetes   Formerly worked at [employer] 1 ppd; no alcohol. 2 step sons; no biologic children.       Social Determinants of Health   Financial Resource Strain: Not on file  Food Insecurity: Not on file  Transportation Needs: Not on file  Physical Activity: Not on file  Stress: Not on file  Social Connections: Not on file  Intimate Partner Violence: Not on file    Past Surgical History:  Procedure Laterality Date   APPLICATION OF WOUND VAC Right 01/29/2020   Procedure: APPLICATION OF WOUND VAC;  Surgeon: Ronny Bacon, MD;  Location: [facility];  Service: General;  Laterality: Right;   BREAST BIOPSY Right 11/06/2019   [place] core path pending venus clip   HERNIA REPAIR Left    INCISION AND DRAINAGE ABSCESS N/A 01/26/2018   Procedure: INCISION AND DEBRIDEMENT OF VULVAR NECROTIZING SOFT TISSUE INFECTION;  Surgeon: Greer Pickerel, MD;  Location: [place];  Service: General;  Laterality: N/A;   INCISION AND DRAINAGE PERIRECTAL ABSCESS N/A 01/22/2018   Procedure: IRRIGATION AND DEBRIDEMENT LABIAL/VULVAR AREA;  Surgeon: Coralie Keens, MD;  Location: [place];  Service: General;  Laterality: N/A;   INCISION AND DRAINAGE PERIRECTAL ABSCESS N/A 01/29/2018   Procedure: IRRIGATION AND DEBRIDEMENT VULVA;  Surgeon: Excell Seltzer, MD;  Location: [place];  Service: General;  Laterality: N/A;   INGUINAL HERNIA REPAIR Left 04/08/2015   Procedure: OPEN LEFT INGUINAL  HERNIA REPAIR WITH MESH;  Surgeon: Ralene Ok, MD;  Location: [place];  Service: General;  Laterality: Left;   INSERTION OF MESH Left 04/08/2015   Procedure: INSERTION OF MESH;  Surgeon: Ralene Ok, MD;  Location: [place];  Service: General;  Laterality: Left;   LAPAROSCOPY     Endometriosis   LEFT HEART CATHETERIZATION WITH CORONARY ANGIOGRAM N/A 12/05/2013   Procedure: LEFT HEART CATHETERIZATION WITH CORONARY ANGIOGRAM;  Surgeon: Jettie Booze, MD;  [facility];  Service: Cardiovascular;  Laterality: N/A;   MASTECTOMY Right  right kidney drained     SIMPLE MASTECTOMY WITH AXILLARY SENTINEL NODE BIOPSY Right 12/23/2019   Procedure: Right SIMPLE MASTECTOMY WITH AXILLARY SENTINEL NODE BIOPSY;  Surgeon: Ronny Bacon, MD;  Location: [facility];  Service: General;  Laterality: Right;   TEE WITHOUT CARDIOVERSION N/A 11/28/2013   Procedure: TRANSESOPHAGEAL ECHOCARDIOGRAM (TEE);  Surgeon: Thayer Headings, MD;  Location: [place];  Service: Cardiovascular;  Laterality: N/A;   WOUND DEBRIDEMENT Right 01/29/2020   Procedure: DEBRIDEMENT WOUND, Excisional debridement skin, subcutaneous and muscle right chest wall;  Surgeon: Ronny Bacon, MD;  Location: [facility];  Service: General;  Laterality: Right;    Family History  Problem Relation Age of Onset   Venous thrombosis Brother    Other Brother        BRAIN TUMOR   Asthma Father    Diabetes Father    Coronary artery disease Mother    Hypertension Mother    Diabetes Mother    Breast cancer Mother 59   Asthma Sister    Diabetes type II Brother    Diabetes Brother     Allergies  Allergen Reactions   Adhesive [Tape] Rash and Other (See Comments)    TAKES OFF THE SKIN (CERTAIN MEDICAL TAPES DO THIS!!)   Metoprolol Shortness Of Breath    Occurrence of shortness of breath after 3 days   Montelukast Shortness Of Breath   Morphine Sulfate Anaphylaxis, Shortness Of Breath and Nausea And Vomiting    Swollen Throat - Able  to tolerate dilaudid   Penicillins Anaphylaxis, Hives and Shortness Of Breath    Throat swells Has patient had a PCN reaction causing immediate rash, facial/tongue/throat swelling, SOB or lightheadedness with hypotension: Yes Has patient had a PCN reaction causing severe rash involving mucus membranes or skin necrosis: No Has patient had a PCN reaction that required hospitalization: Yes Has patient had a PCN reaction occurring within the last 10 years: No If all of the above answers are "NO", then may proceed with Cephalosporin use.    Diltiazem Swelling   Gabapentin Swelling   Midodrine     Lightheaded and falling down    Current Outpatient Medications on File Prior to Visit  Medication Sig Dispense Refill   albuterol (VENTOLIN HFA) 108 (90 Base) MCG/ACT inhaler Inhale 2 puffs into the lungs every 6 (six) hours as needed for wheezing or shortness of breath.     ALPRAZolam (XANAX) 1 MG tablet Take 1 mg by mouth 4 (four) times daily as needed for anxiety.      anastrozole (ARIMIDEX) 1 MG tablet Take 1 tablet (1 mg total) by mouth daily. 60 tablet 2   aspirin EC 81 MG tablet Take 81 mg by mouth daily before breakfast.     budesonide-formoterol (SYMBICORT) 80-4.5 MCG/ACT inhaler INHALE 2 PUFFS BY MOUTH EVERY 12 HOURS TO PREVENT COUGH OR WHEEZING *RINSE MOUTH AFTER EACH USE* 10.2 g 2   busPIRone (BUSPAR) 30 MG tablet Take 30 mg by mouth 2 (two) times daily.     citalopram (CELEXA) 20 MG tablet Take 1 tablet (20 mg total) by mouth daily. 30 tablet 2   desvenlafaxine (PRISTIQ) 100 MG 24 hr tablet Take 100 mg by mouth daily.     Desvenlafaxine ER 100 MG TB24 Take 1 tablet by mouth every morning.     diclofenac Sodium (VOLTAREN) 1 % GEL Apply 2 g topically 3 (three) times daily as needed. For pain. 100 g 0   Empagliflozin-metFORMIN HCl ER 25-1000 MG TB24 Take 1  tablet by mouth daily with breakfast.     famotidine (PEPCID) 20 MG tablet Take 1 tablet (20 mg total) by mouth 2 (two) times daily. 60  tablet 0   FLUoxetine (PROZAC) 40 MG capsule Take 40 mg by mouth daily.     glucose blood test strip Use as instructed 300 each 0   Insulin Pen Needle 32G X 4 MM MISC Used to give insulin injections twice daily. 100 each 11   insulin regular human CONCENTRATED (HUMULIN R) 500 UNIT/ML injection Inject 30 Units into the skin 3 (three) times daily with meals.      Insulin Syringe-Needle U-100 (GLOBAL INJECT EASE INSULIN SYR) 30G X 1/2" 1 ML MISC See admin instructions.     mupirocin ointment (BACTROBAN) 2 % Apply 1 application topically 2 (two) times daily. Apply to the affected area 2 times a day 22 g 3   OXYGEN Inhale 2-4 L into the lungs 2 (two) times daily as needed (shortness of breath).      pregabalin (LYRICA) 300 MG capsule Take 1 capsule (300 mg total) by mouth 2 (two) times daily. For nerve pain. 180 capsule 0   ReliOn Ultra Thin Lancets 30G MISC Use as directed for blood sugar checks. 300 each 0   rOPINIRole (REQUIP) 1 MG tablet TAKE 1 TABLET BY MOUTH EVERY NIGHT AT BEDTIME FOR RESTLESS LEGS. (Patient taking differently: Take 1 mg by mouth at bedtime.) 90 tablet 1   rosuvastatin (CRESTOR) 20 MG tablet Take 1 tablet (20 mg total) by mouth daily. For cholesterol. 90 tablet 3   traZODone (DESYREL) 100 MG tablet Take 200 mg by mouth at bedtime.      ULTICARE MINI PEN NEEDLES 31G X 6 MM MISC USE AS DIRECTED 3 TIMES DAILY 100 each 0   clindamycin (CLEOCIN) 300 MG capsule Take 1 capsule (300 mg total) by mouth 3 (three) times daily for 10 days. (Patient not taking: Reported on 01/29/2021) 30 capsule 0   XIGDUO XR 5-500 MG TB24 Take 1 tablet by mouth every morning.     No current facility-administered medications on file prior to visit.    BP 134/82   Pulse 91   Temp 98.6 F (37 C) (Temporal)   Ht '5\' 4"'$  (1.626 m)   Wt 242 lb (109.8 kg)   LMP  (LMP Unknown)   SpO2 93%   BMI 41.54 kg/m  Objective:   Physical Exam Constitutional:      Appearance: She is not ill-appearing.  Pulmonary:      Effort: Pulmonary effort is normal.  Skin:    General: Skin is warm.     Comments: Open pressure sore to left heel without surrounding erythema. Copious amounts of tan drainage from the site. Tenderness.  Neurological:     Mental Status: She is alert.          Assessment & Plan:      This visit occurred during the SARS-CoV-2 public health emergency.  Safety protocols were in place, including screening questions prior to the visit, additional usage of staff PPE, and extensive cleaning of exam room while observing appropriate contact time as indicated for disinfecting solutions.

## 2021-01-29 NOTE — Patient Instructions (Signed)
Start Doxycycline antibiotic for the infection. Take 1 tablet by mouth twice daily for 10 days.  You will be contacted regarding your referral to the Wound clinic and for home health nurse.  Please let us know if you have not been contacted within a few days.  It was a pleasure to see you today!

## 2021-01-29 NOTE — Assessment & Plan Note (Signed)
Recent hospital admission. Hospital notes, labs, imaging reviewed  No improvement with cefdinir, switch to clindamycin several days later for which she cannot tolerate.  Overall cellulitis to the extremity has resolved with the exception of her left heel.  We will treat with a course of doxycycline and refer her to wound clinic and home health.

## 2021-02-01 LAB — WOUND CULTURE
MICRO NUMBER:: 12152430
SPECIMEN QUALITY:: ADEQUATE

## 2021-02-02 ENCOUNTER — Encounter: Payer: BLUE CROSS/BLUE SHIELD | Attending: Physician Assistant | Admitting: Physician Assistant

## 2021-02-02 ENCOUNTER — Other Ambulatory Visit: Payer: Self-pay

## 2021-02-02 ENCOUNTER — Other Ambulatory Visit: Payer: Self-pay | Admitting: *Deleted

## 2021-02-02 DIAGNOSIS — Z833 Family history of diabetes mellitus: Secondary | ICD-10-CM | POA: Diagnosis not present

## 2021-02-02 DIAGNOSIS — L03116 Cellulitis of left lower limb: Secondary | ICD-10-CM | POA: Diagnosis not present

## 2021-02-02 DIAGNOSIS — Z794 Long term (current) use of insulin: Secondary | ICD-10-CM | POA: Diagnosis not present

## 2021-02-02 DIAGNOSIS — N63 Unspecified lump in unspecified breast: Secondary | ICD-10-CM

## 2021-02-02 DIAGNOSIS — Z8249 Family history of ischemic heart disease and other diseases of the circulatory system: Secondary | ICD-10-CM | POA: Insufficient documentation

## 2021-02-02 DIAGNOSIS — Z885 Allergy status to narcotic agent status: Secondary | ICD-10-CM | POA: Diagnosis not present

## 2021-02-02 DIAGNOSIS — Z170 Estrogen receptor positive status [ER+]: Secondary | ICD-10-CM

## 2021-02-02 DIAGNOSIS — E11621 Type 2 diabetes mellitus with foot ulcer: Secondary | ICD-10-CM | POA: Diagnosis not present

## 2021-02-02 DIAGNOSIS — I69354 Hemiplegia and hemiparesis following cerebral infarction affecting left non-dominant side: Secondary | ICD-10-CM | POA: Diagnosis not present

## 2021-02-02 DIAGNOSIS — I890 Lymphedema, not elsewhere classified: Secondary | ICD-10-CM | POA: Insufficient documentation

## 2021-02-02 DIAGNOSIS — L89623 Pressure ulcer of left heel, stage 3: Secondary | ICD-10-CM | POA: Insufficient documentation

## 2021-02-02 DIAGNOSIS — Z86718 Personal history of other venous thrombosis and embolism: Secondary | ICD-10-CM | POA: Insufficient documentation

## 2021-02-02 DIAGNOSIS — C50511 Malignant neoplasm of lower-outer quadrant of right female breast: Secondary | ICD-10-CM

## 2021-02-02 DIAGNOSIS — Z888 Allergy status to other drugs, medicaments and biological substances status: Secondary | ICD-10-CM | POA: Diagnosis not present

## 2021-02-02 DIAGNOSIS — I872 Venous insufficiency (chronic) (peripheral): Secondary | ICD-10-CM | POA: Insufficient documentation

## 2021-02-02 NOTE — Progress Notes (Signed)
[name], [name] Visit Report for 02/02/2021 Allergy List Details Patient Name: [name], [name] Date of Service: 02/02/2021 8:15 AM Medical Record Number: [id number] Patient Account Number: [id number] Date of Birth/Sex: [birth year] (50 y.o. F) Treating RN: Dolan Amen Primary Care Kishia Shackett: Alma Friendly Other Clinician: Referring Rhegan Trunnell: Alma Friendly Treating Lousie Calico/Extender: Skipper Cliche in Treatment: 0 Allergies Active Allergies penicillin Reaction: rash Severity: Moderate Singulair Reaction: tongue swelling Severity: Severe metoprolol morphine Reaction: face swelling, unable to swallow Severity: Severe prednisone Reaction: throat swelling Severity: Severe diltiazem Allergy Notes Electronic Signature(s) Signed: 02/02/2021 3:39:23 PM By: Dolan Amen RN Entered By: Dolan Amen on 02/02/2021 08:35:36 [name], [name] -------------------------------------------------------------------------------- Arrival Information Details Patient Name: [name] Date of Service: 02/02/2021 8:15 AM Medical Record Number: [id number] Patient Account Number: [id number] Date of Birth/Sex: [birth year] (50 y.o. F) Treating RN: Dolan Amen Primary Care Aleira Deiter: Alma Friendly Other Clinician: Referring Sheyenne Konz: Alma Friendly Treating Zyquan Crotty/Extender: Skipper Cliche in Treatment: 0 Visit Information Patient Arrived: Ambulatory Arrival Time: 08:31 Accompanied By: husband Transfer Assistance: None Patient Identification Verified: Yes Secondary Verification Process Completed: Yes History Since Last Visit Electronic Signature(s) Signed: 02/02/2021 3:39:23 PM By: Dolan Amen RN Entered By: Dolan Amen on 02/02/2021 08:32:22 [name] -------------------------------------------------------------------------------- Clinic Level of Care Assessment Details Patient Name: [name] Date of Service: 02/02/2021  8:15 AM Medical Record Number: [id number] Patient Account Number: [id number] Date of Birth/Sex: Sep 20, 1970 (50 y.o. F) Treating RN: Dolan Amen Primary Care Juwana Thoreson: Alma Friendly Other Clinician: Referring Nelline Lio: Alma Friendly Treating [name] Sermeno/Extender: Skipper Cliche in Treatment: 0 Clinic Level of Care Assessment Items TOOL 1 Quantity Score X - Use when EandM and Procedure is performed on INITIAL visit 1 0 ASSESSMENTS - Nursing Assessment / Reassessment X - General Physical Exam (combine w/ comprehensive assessment (listed just below) when performed on new 1 20 pt. evals) X- 1 25 Comprehensive Assessment (HX, ROS, Risk Assessments, Wounds Hx, etc.) ASSESSMENTS - Wound and Skin Assessment / Reassessment '[]'$  - Dermatologic / Skin Assessment (not related to wound area) 0 ASSESSMENTS - Ostomy and/or Continence Assessment and Care '[]'$  - Incontinence Assessment and Management 0 '[]'$  - 0 Ostomy Care Assessment and Management (repouching, etc.) PROCESS - Coordination of Care X - Simple Patient / Family Education for ongoing care 1 15 '[]'$  - 0 Complex (extensive) Patient / Family Education for ongoing care X- 1 10 Staff obtains Programmer, systems, Records, Test Results / Process Orders '[]'$  - 0 Staff telephones HHA, Nursing Homes / Clarify orders / etc '[]'$  - 0 Routine Transfer to another Facility (non-emergent condition) '[]'$  - 0 Routine Hospital Admission (non-emergent condition) '[]'$  - 0 New Admissions / Biomedical engineer / Ordering NPWT, Apligraf, etc. '[]'$  - 0 Emergency Hospital Admission (emergent condition) PROCESS - Special Needs '[]'$  - Pediatric / Minor Patient Management 0 '[]'$  - 0 Isolation Patient Management '[]'$  - 0 Hearing / Language / Visual special needs '[]'$  - 0 Assessment of Community assistance (transportation, D/C planning, etc.) '[]'$  - 0 Additional assistance / Altered mentation '[]'$  - 0 Support Surface(s) Assessment (bed, cushion, seat, etc.) INTERVENTIONS -  Miscellaneous '[]'$  - External ear exam 0 '[]'$  - 0 Patient Transfer (multiple staff / Civil Service fast streamer / Similar devices) '[]'$  - 0 Simple Staple / Suture removal (25 or less) '[]'$  - 0 Complex Staple / Suture removal (26 or more) '[]'$  - 0 Hypo/Hyperglycemic Management (do not check if billed separately) '[]'$  - 0 Ankle / Brachial Index (ABI) - do not check if billed separately Has the  patient been seen at the hospital within the last three years: Yes Total Score: 70 Level Of Care: New/Established - Level 2 [name], [name] Electronic Signature(s) Signed: 02/02/2021 3:39:23 PM By: Dolan Amen RN Entered By: Dolan Amen on 02/02/2021 09:06:57 [name], [name] -------------------------------------------------------------------------------- Lower Extremity Assessment Details Patient Name: [name] Date of Service: 02/02/2021 8:15 AM Medical Record Number: [id number] Patient Account Number: [id number] Date of Birth/Sex: Aug 28, 1970 (50 y.o. F) Treating RN: Dolan Amen Primary Care Timmi Devora: Alma Friendly Other Clinician: Referring Pam Vanalstine: Alma Friendly Treating Belvia Gotschall/Extender: Jeri Cos Weeks in Treatment: 0 Edema Assessment Assessed: [Left: Yes] [Right: No] Edema: [Left: N] [Right: o] Vascular Assessment Pulses: Dorsalis Pedis Palpable: [Left:Yes] Electronic Signature(s) Signed: 02/02/2021 3:39:23 PM By: Dolan Amen RN Entered By: Dolan Amen on 02/02/2021 08:48:29 [name], [name] -------------------------------------------------------------------------------- Multi Wound Chart Details Patient Name: [name] Date of Service: 02/02/2021 8:15 AM Medical Record Number: [id number] Patient Account Number: [id number] Date of Birth/Sex: 12/02/1970 (50 y.o. F) Treating RN: Dolan Amen Primary Care Sayla Golonka: Alma Friendly Other Clinician: Referring Shianne Zeiser: Alma Friendly Treating Verleen Stuckey/Extender: Skipper Cliche in  Treatment: 0 Vital Signs Height(in): 75 Pulse(bpm): 31 Weight(lbs): 242 Blood Pressure(mmHg): 138/83 Body Mass Index(BMI): 42 Temperature(F): 98.1 Respiratory Rate(breaths/min): 18 Photos: [N/A:N/A] Wound Location: Left Calcaneus N/A N/A Wounding Event: Blister N/A N/A Primary Etiology: Pressure Ulcer N/A N/A Comorbid History: Asthma, Chronic Obstructive N/A N/A Pulmonary Disease (COPD), Sleep Apnea, Arrhythmia, Congestive Heart Failure, Coronary Artery Disease, Hypertension, Type II Diabetes, Neuropathy, Received Chemotherapy, Received Radiation Date Acquired: 01/13/2021 N/A N/A Weeks of Treatment: 0 N/A N/A Wound Status: Open N/A N/A Measurements L x W x D (cm) 3x4x0.1 N/A N/A Area (cm) : 9.425 N/A N/A Volume (cm) : 0.942 N/A N/A % Reduction in Area: 0.00% N/A N/A % Reduction in Volume: 0.00% N/A N/A Classification: Category/Stage III N/A N/A Exudate Amount: Medium N/A N/A Exudate Type: Serosanguineous N/A N/A Exudate Color: red, brown N/A N/A Granulation Amount: Medium (34-66%) N/A N/A Granulation Quality: Pink N/A N/A Necrotic Amount: Medium (34-66%) N/A N/A Exposed Structures: Fat Layer (Subcutaneous Tissue): N/A N/A Yes Fascia: No Tendon: No Muscle: No Joint: No Bone: No Epithelialization: Medium (34-66%) N/A N/A Treatment Notes Electronic Signature(s) Signed: 02/02/2021 3:39:23 PM By: Dolan Amen RN Entered By: Dolan Amen on 02/02/2021 09:00:22 [name], [name] [name], [name] -------------------------------------------------------------------------------- Multi-Disciplinary Care Plan Details Patient Name: [name] Date of Service: 02/02/2021 8:15 AM Medical Record Number: [id number] Patient Account Number: [id number] Date of Birth/Sex: April 13, 1971 (50 y.o. F) Treating RN: Dolan Amen Primary Care Renea Schoonmaker: Alma Friendly Other Clinician: Referring Treyten Monestime: Alma Friendly Treating Owin Vignola/Extender: Skipper Cliche in Treatment: 0 Active Inactive Wound/Skin Impairment Nursing Diagnoses: Impaired tissue integrity Goals: Patient/caregiver will verbalize understanding of skin care regimen Date Initiated: 02/02/2021 Target Resolution Date: 02/02/2021 Goal Status: Active Ulcer/skin breakdown will have a volume reduction of 30% by week 4 Date Initiated: 02/02/2021 Target Resolution Date: 03/05/2021 Goal Status: Active Ulcer/skin breakdown will have a volume reduction of 50% by week 8 Date Initiated: 02/02/2021 Target Resolution Date: 04/05/2021 Goal Status: Active Ulcer/skin breakdown will have a volume reduction of 80% by week 12 Date Initiated: 02/02/2021 Target Resolution Date: 05/05/2021 Goal Status: Active Ulcer/skin breakdown will heal within 14 weeks Date Initiated: 02/02/2021 Target Resolution Date: 06/05/2021 Goal Status: Active Interventions: Assess patient/caregiver ability to obtain necessary supplies Assess patient/caregiver ability to perform ulcer/skin care regimen upon admission and as needed Assess ulceration(s) every visit Provide education on ulcer and skin care  Treatment Activities: Referred to DME Tylan Briguglio for dressing supplies : 02/02/2021 Skin care regimen initiated : 02/02/2021 Notes: Electronic Signature(s) Signed: 02/02/2021 3:39:23 PM By: Dolan Amen RN Entered By: Dolan Amen on 02/02/2021 08:59:05 [name], [name] -------------------------------------------------------------------------------- Pain Assessment Details Patient Name: [name] Date of Service: 02/02/2021 8:15 AM Medical Record Number: [id number] Patient Account Number: [id number] Date of Birth/Sex: Jan 02, 1971 (50 y.o. F) Treating RN: Dolan Amen Primary Care Valary Manahan: Alma Friendly Other Clinician: Referring Kellyn Mccary: Alma Friendly Treating Istvan Behar/Extender: Skipper Cliche in Treatment: 0 Active Problems Location of Pain Severity and Description of  Pain Patient Has Paino Yes Site Locations Pain Location: Pain in Ulcers Rate the pain. Current Pain Level: 5 Pain Management and Medication Current Pain Management: Electronic Signature(s) Signed: 02/02/2021 3:39:23 PM By: Dolan Amen RN Entered By: Dolan Amen on 02/02/2021 08:32:34 [name], [name] -------------------------------------------------------------------------------- Patient/Caregiver Education Details Patient Name: [name] Date of Service: 02/02/2021 8:15 AM Medical Record Number: [id number] Patient Account Number: [id number] Date of Birth/Gender: 07/07/1971 (50 y.o. F) Treating RN: Dolan Amen Primary Care Physician: Alma Friendly Other Clinician: Referring Physician: Alma Friendly Treating Physician/Extender: Skipper Cliche in Treatment: 0 Education Assessment Education Provided To: Patient Education Topics Provided Wound/Skin Impairment: Methods: Explain/Verbal Responses: State content correctly Electronic Signature(s) Signed: 02/02/2021 3:39:23 PM By: Dolan Amen RN Entered By: Dolan Amen on 02/02/2021 09:07:10 [name], [name] -------------------------------------------------------------------------------- Wound Assessment Details Patient Name: [name] Date of Service: 02/02/2021 8:15 AM Medical Record Number: [id number] Patient Account Number: [id number] Date of Birth/Sex: 10/20/1970 (50 y.o. F) Treating RN: Dolan Amen Primary Care Asif Muchow: Alma Friendly Other Clinician: Referring Jaionna Weisse: Alma Friendly Treating Nolawi Kanady/Extender: Skipper Cliche in Treatment: 0 Wound Status Wound Number: 9 Primary Pressure Ulcer Etiology: Wound Location: Left Calcaneus Wound Open Wounding Event: Blister Status: Date Acquired: 01/13/2021 Comorbid Asthma, Chronic Obstructive Pulmonary Disease (COPD), Weeks Of Treatment: 0 History: Sleep Apnea, Arrhythmia, Congestive Heart Failure, Clustered  Wound: No Coronary Artery Disease, Hypertension, Type II Diabetes, Neuropathy, Received Chemotherapy, Received Radiation Photos Wound Measurements Length: (cm) 3 Width: (cm) 4 Depth: (cm) 0.1 Area: (cm) 9.425 Volume: (cm) 0.942 % Reduction in Area: 0% % Reduction in Volume: 0% Epithelialization: Medium (34-66%) Tunneling: No Undermining: No Wound Description Classification: Category/Stage III Exudate Amount: Medium Exudate Type: Serosanguineous Exudate Color: red, brown Foul Odor After Cleansing: No Slough/Fibrino Yes Wound Bed Granulation Amount: Medium (34-66%) Exposed Structure Granulation Quality: Pink Fascia Exposed: No Necrotic Amount: Medium (34-66%) Fat Layer (Subcutaneous Tissue) Exposed: Yes Necrotic Quality: Adherent Slough Tendon Exposed: No Muscle Exposed: No Joint Exposed: No Bone Exposed: No Electronic Signature(s) Signed: 02/02/2021 3:39:23 PM By: Dolan Amen RN Entered By: Dolan Amen on 02/02/2021 08:58:13 [name], [name] -------------------------------------------------------------------------------- [name] Details Patient Name: [name] Date of Service: 02/02/2021 8:15 AM Medical Record Number: [id number] Patient Account Number: [id number] Date of Birth/Sex: 21-May-1971 (50 y.o. F) Treating RN: Dolan Amen Primary Care Bernal Luhman: Alma Friendly Other Clinician: Referring Aubriauna Riner: Alma Friendly Treating Emelly Wurtz/Extender: Skipper Cliche in Treatment: 0 Vital Signs Time Taken: 08:32 Temperature (F): 98.1 Height (in): 64 Pulse (bpm): 71 Source: Stated Respiratory Rate (breaths/min): 18 Weight (lbs): 242 Blood Pressure (mmHg): 138/83 Source: Measured Reference Range: 80 - 120 mg / dl Body Mass Index (BMI): 41.5 Electronic Signature(s) Signed: 02/02/2021 3:39:23 PM By: Dolan Amen RN Entered By: Dolan Amen on 02/02/2021 08:35:23

## 2021-02-02 NOTE — Progress Notes (Signed)
[name], [name] Visit Report for 02/02/2021 Abuse/Suicide Risk Screen Details Patient Name: [name], [name] Date of Service: 02/02/2021 8:15 AM Medical Record Number: [id number] Patient Account Number: [id number] Date of Birth/Sex: 13-May-1971 (50 y.o. F) Treating RN: Dolan Amen Primary Care Babara Buffalo: Alma Friendly Other Clinician: Referring Umaiza Matusik: Alma Friendly Treating Ilo Beamon/Extender: Skipper Cliche in Treatment: 0 Abuse/Suicide Risk Screen Items Answer ABUSE RISK SCREEN: Has anyone close to you tried to hurt or harm you recentlyo No Do you feel uncomfortable with anyone in your familyo No Has anyone forced you do things that you didnot want to doo No Electronic Signature(s) Signed: 02/02/2021 3:39:23 PM By: Dolan Amen RN Entered By: Dolan Amen on 02/02/2021 08:36:50 Wasilewski, [name] ([id number]) -------------------------------------------------------------------------------- Activities of Daily Living Details Patient Name: [name] Date of Service: 02/02/2021 8:15 AM Medical Record Number: [id number] Patient Account Number: [id number] Date of Birth/Sex: 1971-03-12 (50 y.o. F) Treating RN: Dolan Amen Primary Care Khylie Larmore: Alma Friendly Other Clinician: Referring Lamont Glasscock: Alma Friendly Treating Amad Mau/Extender: Skipper Cliche in Treatment: 0 Activities of Daily Living Items Answer Activities of Daily Living (Please select one for each item) Drive Automobile Completely Able Take Medications Completely Able Use Telephone Completely Able Care for Appearance Completely Able Use Toilet Completely Able Bath / Shower Completely Able Dress Self Completely Able Feed Self Completely Able Walk Completely Able Get In / Out Bed Completely Able Housework Completely Able Prepare Meals Completely [place] for Self Completely Able Electronic Signature(s) Signed: 02/02/2021 3:39:23 PM By: Dolan Amen  RN Entered By: Dolan Amen on 02/02/2021 08:37:09 [name] -------------------------------------------------------------------------------- Education Screening Details Patient Name: [name] Date of Service: 02/02/2021 8:15 AM Medical Record Number: [id number] Patient Account Number: [id number] Date of Birth/Sex: 06-10-1971 (50 y.o. F) Treating RN: Dolan Amen Primary Care Ajeet Casasola: Alma Friendly Other Clinician: Referring Emie Sommerfeld: Alma Friendly Treating Tramayne Sebesta/Extender: Skipper Cliche in Treatment: 0 Primary Learner Assessed: Patient Learning Preferences/Education Level/Primary Language Learning Preference: Explanation, Demonstration Highest Education Level: High School Preferred Language: English Cognitive Barrier Language Barrier: No Translator Needed: No Memory Deficit: No Emotional Barrier: No Cultural/Religious Beliefs Affecting Medical Care: No Physical Barrier Impaired Vision: No Impaired Hearing: No Decreased Hand dexterity: No Knowledge/Comprehension Knowledge Level: Medium Comprehension Level: Medium Ability to understand written instructions: Medium Ability to understand verbal instructions: Medium Motivation Anxiety Level: Calm Cooperation: Cooperative Education Importance: Acknowledges Need Interest in Health Problems: Asks Questions Perception: Coherent Willingness to Engage in Self-Management Medium Activities: Readiness to Engage in Self-Management Medium Activities: Electronic Signature(s) Signed: 02/02/2021 3:39:23 PM By: Dolan Amen RN Entered By: Dolan Amen on 02/02/2021 08:37:46 [name], [name] -------------------------------------------------------------------------------- Fall Risk Assessment Details Patient Name: [name] Date of Service: 02/02/2021 8:15 AM Medical Record Number: [id number] Patient Account Number: [id number] Date of Birth/Sex: 01-13-1971 (50 y.o. F) Treating  RN: Dolan Amen Primary Care Akeen Ledyard: Alma Friendly Other Clinician: Referring Brentyn Seehafer: Alma Friendly Treating Kol Consuegra/Extender: Skipper Cliche in Treatment: 0 Fall Risk Assessment Items Have you had 2 or more falls in the last 12 monthso 0 No Have you had any fall that resulted in injury in the last 12 monthso 0 No FALLS RISK SCREEN History of falling - immediate or within 3 months 0 No Secondary diagnosis (Do you have 2 or more medical diagnoseso) 15 Yes Ambulatory aid None/bed rest/wheelchair/nurse 0 Yes Crutches/cane/walker 0 No Furniture 0 No Intravenous therapy Access/Saline/Heparin Lock 0 No Gait/Transferring Normal/ bed rest/ wheelchair 0 Yes Weak (short steps with or without  shuffle, stooped but able to lift head while walking, may 0 No seek support from furniture) Impaired (short steps with shuffle, may have difficulty arising from chair, head down, impaired 0 No balance) Mental Status Oriented to own ability 0 Yes Electronic Signature(s) Signed: 02/02/2021 3:39:23 PM By: Dolan Amen RN Entered By: Dolan Amen on 02/02/2021 08:37:56 Mees, [name] ([id number]) -------------------------------------------------------------------------------- Foot Assessment Details Patient Name: [name] Date of Service: 02/02/2021 8:15 AM Medical Record Number: [id number] Patient Account Number: [id number] Date of Birth/Sex: 12-19-1970 (50 y.o. F) Treating RN: Dolan Amen Primary Care Manar Smalling: Alma Friendly Other Clinician: Referring Baylon Santelli: Alma Friendly Treating Johnica Armwood/Extender: Skipper Cliche in Treatment: 0 Foot Assessment Items Site Locations + = Sensation present, - = Sensation absent, C = Callus, U = Ulcer R = Redness, W = Warmth, M = Maceration, PU = Pre-ulcerative lesion F = Fissure, S = Swelling, D = Dryness Assessment Right: Left: Other Deformity: No No Prior Foot Ulcer: No No Prior Amputation: No No Charcot Joint: No  No Ambulatory Status: Ambulatory Without Help Gait: Steady Electronic Signature(s) Signed: 02/02/2021 3:39:23 PM By: Dolan Amen RN Entered By: Dolan Amen on 02/02/2021 08:38:13 Yeh, [name] ([id number]) -------------------------------------------------------------------------------- Nutrition Risk Screening Details Patient Name: [name] Date of Service: 02/02/2021 8:15 AM Medical Record Number: [id number] Patient Account Number: [id number] Date of Birth/Sex: May 25, 1971 (50 y.o. F) Treating RN: Dolan Amen Primary Care Rhyker Silversmith: Alma Friendly Other Clinician: Referring Rhia Blatchford: Alma Friendly Treating Demetrus Pavao/Extender: Skipper Cliche in Treatment: 0 Height (in): 64 Weight (lbs): 242 Body Mass Index (BMI): 41.5 Nutrition Risk Screening Items Score Screening NUTRITION RISK SCREEN: I have an illness or condition that made me change the kind and/or amount of food I eat 0 No I eat fewer than two meals per day 0 No I eat few fruits and vegetables, or milk products 0 No I have three or more drinks of beer, liquor or wine almost every day 0 No I have tooth or mouth problems that make it hard for me to eat 0 No I don't always have enough money to buy the food I need 0 No I eat alone most of the time 0 No I take three or more different prescribed or over-the-counter drugs a day 1 Yes Without wanting to, I have lost or gained 10 pounds in the last six months 0 No I am not always physically able to shop, cook and/or feed myself 0 No Nutrition Protocols Good Risk Protocol 0 No interventions needed Moderate Risk Protocol High Risk Proctocol Risk Level: Good Risk Score: 1 Electronic Signature(s) Signed: 02/02/2021 3:39:23 PM By: Dolan Amen RN Entered By: Dolan Amen on 02/02/2021 08:38:07

## 2021-02-03 ENCOUNTER — Other Ambulatory Visit: Payer: Self-pay

## 2021-02-03 ENCOUNTER — Inpatient Hospital Stay: Payer: BC Managed Care – PPO | Attending: Internal Medicine | Admitting: Internal Medicine

## 2021-02-03 ENCOUNTER — Inpatient Hospital Stay: Payer: BC Managed Care – PPO

## 2021-02-03 ENCOUNTER — Telehealth: Payer: Self-pay

## 2021-02-03 DIAGNOSIS — J9610 Chronic respiratory failure, unspecified whether with hypoxia or hypercapnia: Secondary | ICD-10-CM | POA: Diagnosis not present

## 2021-02-03 DIAGNOSIS — Z170 Estrogen receptor positive status [ER+]: Secondary | ICD-10-CM

## 2021-02-03 DIAGNOSIS — E1165 Type 2 diabetes mellitus with hyperglycemia: Secondary | ICD-10-CM | POA: Diagnosis not present

## 2021-02-03 DIAGNOSIS — R519 Headache, unspecified: Secondary | ICD-10-CM | POA: Insufficient documentation

## 2021-02-03 DIAGNOSIS — Z794 Long term (current) use of insulin: Secondary | ICD-10-CM | POA: Diagnosis not present

## 2021-02-03 DIAGNOSIS — L03116 Cellulitis of left lower limb: Secondary | ICD-10-CM | POA: Insufficient documentation

## 2021-02-03 DIAGNOSIS — C50511 Malignant neoplasm of lower-outer quadrant of right female breast: Secondary | ICD-10-CM

## 2021-02-03 DIAGNOSIS — F419 Anxiety disorder, unspecified: Secondary | ICD-10-CM | POA: Diagnosis not present

## 2021-02-03 DIAGNOSIS — F319 Bipolar disorder, unspecified: Secondary | ICD-10-CM | POA: Diagnosis not present

## 2021-02-03 DIAGNOSIS — L89623 Pressure ulcer of left heel, stage 3: Secondary | ICD-10-CM | POA: Diagnosis not present

## 2021-02-03 DIAGNOSIS — R197 Diarrhea, unspecified: Secondary | ICD-10-CM | POA: Diagnosis not present

## 2021-02-03 DIAGNOSIS — G629 Polyneuropathy, unspecified: Secondary | ICD-10-CM | POA: Insufficient documentation

## 2021-02-03 DIAGNOSIS — G43909 Migraine, unspecified, not intractable, without status migrainosus: Secondary | ICD-10-CM | POA: Diagnosis not present

## 2021-02-03 DIAGNOSIS — F32A Depression, unspecified: Secondary | ICD-10-CM | POA: Insufficient documentation

## 2021-02-03 DIAGNOSIS — Z79899 Other long term (current) drug therapy: Secondary | ICD-10-CM | POA: Insufficient documentation

## 2021-02-03 LAB — CBC WITH DIFFERENTIAL/PLATELET
Abs Immature Granulocytes: 0.05 10*3/uL (ref 0.00–0.07)
Basophils Absolute: 0.1 10*3/uL (ref 0.0–0.1)
Basophils Relative: 1 %
Eosinophils Absolute: 0.2 10*3/uL (ref 0.0–0.5)
Eosinophils Relative: 3 %
HCT: 39.4 % (ref 36.0–46.0)
Hemoglobin: 12.7 g/dL (ref 12.0–15.0)
Immature Granulocytes: 1 %
Lymphocytes Relative: 15 %
Lymphs Abs: 1.2 10*3/uL (ref 0.7–4.0)
MCH: 27.9 pg (ref 26.0–34.0)
MCHC: 32.2 g/dL (ref 30.0–36.0)
MCV: 86.4 fL (ref 80.0–100.0)
Monocytes Absolute: 0.5 10*3/uL (ref 0.1–1.0)
Monocytes Relative: 6 %
Neutro Abs: 5.9 10*3/uL (ref 1.7–7.7)
Neutrophils Relative %: 74 %
Platelets: 184 10*3/uL (ref 150–400)
RBC: 4.56 MIL/uL (ref 3.87–5.11)
RDW: 16.6 % — ABNORMAL HIGH (ref 11.5–15.5)
WBC: 7.9 10*3/uL (ref 4.0–10.5)
nRBC: 0 % (ref 0.0–0.2)

## 2021-02-03 LAB — COMPREHENSIVE METABOLIC PANEL
ALT: 18 U/L (ref 0–44)
AST: 20 U/L (ref 15–41)
Albumin: 3.7 g/dL (ref 3.5–5.0)
Alkaline Phosphatase: 153 U/L — ABNORMAL HIGH (ref 38–126)
Anion gap: 9 (ref 5–15)
BUN: 21 mg/dL — ABNORMAL HIGH (ref 6–20)
CO2: 27 mmol/L (ref 22–32)
Calcium: 9.1 mg/dL (ref 8.9–10.3)
Chloride: 97 mmol/L — ABNORMAL LOW (ref 98–111)
Creatinine, Ser: 0.98 mg/dL (ref 0.44–1.00)
GFR, Estimated: >60 mL/min (ref >60)
Glucose, Bld: 233 mg/dL — ABNORMAL HIGH (ref 70–99)
Potassium: 3.9 mmol/L (ref 3.5–5.1)
Sodium: 133 mmol/L — ABNORMAL LOW (ref 135–145)
Total Bilirubin: 0.6 mg/dL (ref 0.3–1.2)
Total Protein: 8.6 g/dL — ABNORMAL HIGH (ref 6.5–8.1)

## 2021-02-03 MED ORDER — LEUPROLIDE ACETATE (3 MONTH) 11.25 MG IM KIT
11.2500 mg | PACK | Freq: Once | INTRAMUSCULAR | Status: AC
  Administered 2021-02-03: 11.25 mg via INTRAMUSCULAR
  Filled 2021-02-03: qty 11.25

## 2021-02-03 NOTE — Progress Notes (Signed)
[place] NOTE  Patient Care Team: Pleas Koch, NP as PCP - General (Internal Medicine) Rosamaria Lints, MD (Inactive) as Referring Physician (Neurology) Alvester Chou, NP (Nurse Practitioner) Renato Shin, MD as Consulting Physician (Endocrinology) Theodore Demark, RN as Oncology Nurse Navigator Ronny Bacon, MD as Consulting Physician (General Surgery) [place] as Registered Nurse Flora Lipps, MD as Consulting Physician (Pulmonary Disease) Sherlynn Stalls, MD as Consulting Physician (Ophthalmology) Dannielle Karvonen, RN as [place] [facility]  CHIEF COMPLAINTS/PURPOSE OF CONSULTATION: Breast cancer.  #  Oncology History Overview Note  #April 2021-2.5 cm right breast mass [incidental-CT scan chest]  # April 2021- RIGHT BREAST [facility]; s/p ER- 51-90%; PR- 51-90%; Her- 2-NEG. G-2. [Dr.Rodenberg]; s/p  Masectomy- pT2 pN0 (sn); Tamoxifen; Poor candidate for chemo;   # Discontinue tamoxifen early DEC 2021 [sec to nausea/vomiting diarrhea]  #Late December 2021- Anastrazole + Lupron 11.25 every 3 months  # DM- poorly controlled; Seizure disorder/Bipolar/TIA ; Diabetes PN- G-3 [cane/walker; falls]; COPD; OSA active smoker  # SURVIVORSHIP:   # GENETICS:   DIAGNOSIS: Right breast cancer  STAGE:    II     ;  GOALS: goal  CURRENT/MOST RECENT THERAPY : Anastrazole + Lupron 11.25 every 3 months     Carcinoma of lower-outer quadrant of right breast in female, estrogen receptor positive ([place])  11/12/2019 Initial Diagnosis   Carcinoma of lower-outer quadrant of right breast in female, estrogen receptor positive ([place])    HISTORY OF PRESENTING ILLNESS:  [name] 50 y.o.  female premenopausal patient with right-sided stage II ER/PR positive HER-2 negative breast cancer on adjuvant anastrozole +Lupron with multiple comorbidities-including poorly controlled diabetes/bipolar disorder/active smoker/neuropathy is here for  follow-up.  Patient was eval by gynecology for bilateral oophorectomy-ovarian suppression.  However patient declined surgery.  Patient continues to complains of intermittent migraines.   Of note patient complains of diarrhea up to 4 loose stools a day for the last 3 to 4 days.  Denies any abdominal cramping.  Patient has been on doxycycline-for left foot cellulitis/infection.  Patient is being evaluated by wound care.   Chronic neuropathy chronic back pain.  Not any worse.   Review of Systems  Constitutional:  Positive for malaise/fatigue. Negative for chills, diaphoresis, fever and weight loss.  HENT:  Negative for nosebleeds and sore throat.   Eyes:  Negative for double vision.  Respiratory:  Negative for cough, hemoptysis, sputum production, shortness of breath and wheezing.   Cardiovascular:  Negative for chest pain, palpitations, orthopnea and leg swelling.  Gastrointestinal:  Negative for abdominal pain, blood in stool, constipation, diarrhea, heartburn, melena, nausea and vomiting.  Genitourinary:  Negative for dysuria, frequency and urgency.  Musculoskeletal:  Positive for back pain, joint pain and myalgias.  Skin:  Negative for itching.  Neurological:  Positive for tingling. Negative for dizziness, focal weakness, weakness and headaches.  Endo/Heme/Allergies:  Does not bruise/bleed easily.  Psychiatric/Behavioral:  Negative for depression. The patient is not nervous/anxious and does not have insomnia.     MEDICAL HISTORY:  Past Medical History:  Diagnosis Date  . Anxiety    takes Prozac daily  . Anxiety   . Aortic valve calcification   . Asthma    Advair and Spirva daily  . Asthma   . Bipolar disorder ([place])   . Breast cancer, right breast ([place]) 12/23/2019  . CAD (coronary artery disease)    a. LHC 11/2013 done for CP/fluid retention: mild disease in prox LAD,  mild-mod disease in mRCA, EF 60% with normal LVEDP. b. Normal nuc 03/2016.  Marland Kitchen Cancer ([place])   . Cellulitis and  abscess of trunk 03/12/2020  . CHF (congestive heart failure) ([place])   . Chronic diastolic CHF (congestive heart failure) ([place])   . Chronic heart failure with preserved ejection fraction ([place]) 11/16/2018  . CKD (chronic kidney disease), stage II   . COPD (chronic obstructive pulmonary disease) (H[facility])    a. nocturnal O2.  Marland Kitchen COPD (chronic obstructive pulmonary disease) ([place])   . Coronary artery disease   . Decreased urine stream   . Diabetes mellitus   . Diabetes mellitus without complication ([place])   . Dyspnea   . Elevated C-reactive protein (CRP) 01/29/2018  . Elevated sedimentation rate 01/29/2018  . Elevated serum hCG 01/19/2018  . Family history of adverse reaction to anesthesia    mom gets nauseated  . Foot pain, bilateral 05/22/2018  . GERD (gastroesophageal reflux disease)    takes Pepcid daily  . History of blood clots    left leg 3-40yr ago  . Hyperlipidemia   . Hypertension   . Hypertriglyceridemia   . Inguinal hernia, left 01/2015  . Muscle spasm   . Open wound of genital labia   . Peripheral neuropathy   . RBBB   . Seizures ([place]   . Sepsis ([place] 01/19/2018  . Sinus tachycardia    a. persistent since 2009.  .Marland KitchenSmokers' cough ([place]   . Status post mastectomy, right 01/14/2020  . Stroke ([facility] 1989   left sided weakness  . TIA (transient ischemic attack)   . Tobacco abuse   . Vulvar abscess 01/23/2018    SURGICAL HISTORY: Past Surgical History:  Procedure Laterality Date  . APPLICATION OF WOUND VAC Right 01/29/2020   Procedure: APPLICATION OF WOUND VAC;  Surgeon: RRonny Bacon MD;  Location: [facility] ORS;  Service: General;  Laterality: Right;  . BREAST BIOPSY Right 11/06/2019   uKoreacore path pending venus clip  . HERNIA REPAIR Left   . INCISION AND DRAINAGE ABSCESS N/A 01/26/2018   Procedure: INCISION AND DEBRIDEMENT OF VULVAR NECROTIZING SOFT TISSUE INFECTION;  Surgeon: WGreer Pickerel MD;  [place]  Service: General;  Laterality: N/A;  . INCISION AND DRAINAGE  PERIRECTAL ABSCESS N/A 01/22/2018   Procedure: IRRIGATION AND DEBRIDEMENT LABIAL/VULVAR AREA;  Surgeon: BCoralie Keens MD;  Location: [place]  Service: General;  Laterality: N/A;  . INCISION AND DRAINAGE PERIRECTAL ABSCESS N/A 01/29/2018   Procedure: IRRIGATION AND DEBRIDEMENT VULVA;  Surgeon: HExcell Seltzer MD;  [place]  Service: General;  Laterality: N/A;  . INGUINAL HERNIA REPAIR Left 04/08/2015   Procedure: OPEN LEFT INGUINAL HERNIA REPAIR WITH MESH;  Surgeon: ARalene Ok MD;  [place]  Service: General;  Laterality: Left;  . INSERTION OF MESH Left 04/08/2015   Procedure: INSERTION OF MESH;  Surgeon: ARalene Ok MD;  [place]  Service: General;  Laterality: Left;  . LAPAROSCOPY     Endometriosis  . LEFT HEART CATHETERIZATION WITH CORONARY ANGIOGRAM N/A 12/05/2013   Procedure: LEFT HEART CATHETERIZATION WITH CORONARY ANGIOGRAM;  Surgeon: JJettie Booze MD;  [facility];  Service: Cardiovascular;  Laterality: N/A;  . MASTECTOMY Right   . right kidney drained    . SIMPLE MASTECTOMY WITH AXILLARY SENTINEL NODE BIOPSY Right 12/23/2019   Procedure: Right SIMPLE MASTECTOMY WITH AXILLARY SENTINEL NODE BIOPSY;  Surgeon: RRonny Bacon MD;  Location: [facility];  Service: General;  Laterality: Right;  . TEE  WITHOUT CARDIOVERSION N/A 11/28/2013   Procedure: TRANSESOPHAGEAL ECHOCARDIOGRAM (TEE);  Surgeon: Thayer Headings, MD;  Location: [place];  Service: Cardiovascular;  Laterality: N/A;  . WOUND DEBRIDEMENT Right 01/29/2020   Procedure: DEBRIDEMENT WOUND, Excisional debridement skin, subcutaneous and muscle right chest wall;  Surgeon: Ronny Bacon, MD;  Location: [facility];  Service: General;  Laterality: Right;    SOCIAL HISTORY: Social History   Socioeconomic History  . Marital status: Married    Spouse name: Not on file  . Number of children: 2  . Years of education: Not on file  . Highest education level: Not on file   Occupational History  . Not on file  Tobacco Use  . Smoking status: Some Days    Packs/day: 0.25    Years: 36.00    Pack years: 9.00    Types: Cigarettes    Start date: 07/11/1981  . Smokeless tobacco: Never  . Tobacco comments:    1 ppd now  Vaping Use  . Vaping Use: Some days  Substance and Sexual Activity  . Alcohol use: No  . Drug use: No  . Sexual activity: Not Currently  Other Topics Concern  . Not on file  Social History Narrative   ** Merged History Encounter **       Lives in [place]   Married but lives with Boyfriend - not legally separated   Disabled - for BiPolar, Seizure disorder, diabetes   Formerly worked at [employer] 1 ppd; no alcohol. 2 step sons; no biologic children.       Social Determinants of Health   Financial Resource Strain: Not on file  Food Insecurity: Not on file  Transportation Needs: Not on file  Physical Activity: Not on file  Stress: Not on file  Social Connections: Not on file  Intimate Partner Violence: Not on file    FAMILY HISTORY: Family History  Problem Relation Age of Onset  . Venous thrombosis Brother   . Other Brother        BRAIN TUMOR  . Asthma Father   . Diabetes Father   . Coronary artery disease Mother   . Hypertension Mother   . Diabetes Mother   . Breast cancer Mother 15  . Asthma Sister   . Diabetes type II Brother   . Diabetes Brother     ALLERGIES:  is allergic to adhesive [tape], metoprolol, montelukast, morphine sulfate, penicillins, diltiazem, gabapentin, and midodrine.  MEDICATIONS:  Current Outpatient Medications  Medication Sig Dispense Refill  . albuterol (VENTOLIN HFA) 108 (90 Base) MCG/ACT inhaler Inhale 2 puffs into the lungs every 6 (six) hours as needed for wheezing or shortness of breath.    . ALPRAZolam (XANAX) 1 MG tablet Take 1 mg by mouth 4 (four) times daily as needed for anxiety.     Marland Kitchen anastrozole (ARIMIDEX) 1 MG tablet Take 1 tablet (1 mg total) by mouth daily. 60 tablet  2  . aspirin EC 81 MG tablet Take 81 mg by mouth daily before breakfast.    . budesonide-formoterol (SYMBICORT) 80-4.5 MCG/ACT inhaler INHALE 2 PUFFS BY MOUTH EVERY 12 HOURS TO PREVENT COUGH OR WHEEZING *RINSE MOUTH AFTER EACH USE* 10.2 g 2  . busPIRone (BUSPAR) 30 MG tablet Take 30 mg by mouth 2 (two) times daily.    . citalopram (CELEXA) 20 MG tablet Take 1 tablet (20 mg total) by mouth daily. 30 tablet 2  . desvenlafaxine (PRISTIQ) 100 MG 24 hr tablet Take 100  mg by mouth daily.    Marland Kitchen Desvenlafaxine ER 100 MG TB24 Take 1 tablet by mouth every morning.    . diclofenac Sodium (VOLTAREN) 1 % GEL Apply 2 g topically 3 (three) times daily as needed. For pain. 100 g 0  . doxycycline (VIBRA-TABS) 100 MG tablet Take 1 tablet (100 mg total) by mouth 2 (two) times daily. For foot infection. 20 tablet 0  . Empagliflozin-metFORMIN HCl ER 25-1000 MG TB24 Take 1 tablet by mouth daily with breakfast.    . famotidine (PEPCID) 20 MG tablet Take 1 tablet (20 mg total) by mouth 2 (two) times daily. 60 tablet 0  . FLUoxetine (PROZAC) 40 MG capsule Take 40 mg by mouth daily.    Marland Kitchen glucose blood test strip Use as instructed 300 each 0  . Insulin Pen Needle 32G X 4 MM MISC Used to give insulin injections twice daily. 100 each 11  . insulin regular human CONCENTRATED (HUMULIN R) 500 UNIT/ML injection Inject 30 Units into the skin 3 (three) times daily with meals.     . Insulin Syringe-Needle U-100 (GLOBAL INJECT EASE INSULIN SYR) 30G X 1/2" 1 ML MISC See admin instructions.    . mupirocin ointment (BACTROBAN) 2 % Apply 1 application topically 2 (two) times daily. Apply to the affected area 2 times a day 22 g 3  . oxyCODONE-acetaminophen (PERCOCET/ROXICET) 5-325 MG tablet Take 1 tablet by mouth every 8 (eight) hours as needed for up to 5 days for severe pain. 15 tablet 0  . OXYGEN Inhale 2-4 L into the lungs 2 (two) times daily as needed (shortness of breath).     . pregabalin (LYRICA) 300 MG capsule Take 1 capsule (300  mg total) by mouth 2 (two) times daily. For nerve pain. 180 capsule 0  . ReliOn Ultra Thin Lancets 30G MISC Use as directed for blood sugar checks. 300 each 0  . rOPINIRole (REQUIP) 1 MG tablet TAKE 1 TABLET BY MOUTH EVERY NIGHT AT BEDTIME FOR RESTLESS LEGS. (Patient taking differently: Take 1 mg by mouth at bedtime.) 90 tablet 1  . rosuvastatin (CRESTOR) 20 MG tablet Take 1 tablet (20 mg total) by mouth daily. For cholesterol. 90 tablet 3  . traZODone (DESYREL) 100 MG tablet Take 200 mg by mouth at bedtime.     Marland Kitchen ULTICARE MINI PEN NEEDLES 31G X 6 MM MISC USE AS DIRECTED 3 TIMES DAILY 100 each 0  . XIGDUO XR 5-500 MG TB24 Take 1 tablet by mouth every morning.     No current facility-administered medications for this visit.      Marland Kitchen  PHYSICAL EXAMINATION: ECOG PERFORMANCE STATUS: 1 - Symptomatic but completely ambulatory  Vitals:   02/03/21 1500  BP: 133/63  Pulse: 74  Resp: 20  Temp: 99.4 F (37.4 C)   Filed Weights   02/03/21 1500  Weight: 242 lb (109.8 kg)    Physical Exam Constitutional:      Comments: Accompanied by husband.  Ambulating independently.  HENT:     Head: Normocephalic and atraumatic.     Mouth/Throat:     Pharynx: No oropharyngeal exudate.  Eyes:     Pupils: Pupils are equal, round, and reactive to light.  Cardiovascular:     Rate and Rhythm: Normal rate and regular rhythm.  Pulmonary:     Effort: No respiratory distress.     Breath sounds: No wheezing.     Comments: Decreased breath sounds bilaterally the bases. Abdominal:     General: Bowel sounds  are normal. There is no distension.     Palpations: Abdomen is soft. There is no mass.     Tenderness: no abdominal tenderness There is no guarding or rebound.  Musculoskeletal:        General: No tenderness. Normal range of motion.     Cervical back: Normal range of motion and neck supple.  Skin:    General: Skin is warm.     Comments: Right mastectomy incision-well-healed.  Left breast-behind the  nipple upper outer-awaiting 2 cm mass noted.  Mobile.  No skin changes.  No nipple changes or discharge noted.  Neurological:     Mental Status: She is alert and oriented to person, place, and time.  Psychiatric:        Mood and Affect: Affect normal.     LABORATORY DATA:  I have reviewed the data as listed Lab Results  Component Value Date   WBC 7.9 02/03/2021   HGB 12.7 02/03/2021   HCT 39.4 02/03/2021   MCV 86.4 02/03/2021   PLT 184 02/03/2021   Recent Labs    02/25/20 1240 02/25/20 1504 05/15/20 1312 05/17/20 0307 05/17/20 2336 12/23/20 1409 01/13/21 1505 01/14/21 0617 01/20/21 1751 01/22/21 1557 02/03/21 1427  NA 128* 129*   < >  --    < > 138 134*   < > 133* 131* 133*  K 4.3 4.2   < >  --    < > 3.7 3.6   < > 4.6 4.2 3.9  CL 89* 88*   < >  --    < > 100 92*   < > 98 93* 97*  CO2 26 28   < >  --    < > 29 30   < > 26 29 27   GLUCOSE 623* 617*   < >  --    < > 82 194*   < > 345* 409* 233*  BUN 43* 40*   < >  --    < > 25* 23*   < > 27* 30* 21*  CREATININE 1.29* 1.39*   < >  --    < > 1.05* 1.14*   < > 0.97 1.16* 0.98  CALCIUM 8.8* 8.9   < >  --    < > 9.5 9.0   < > 9.5 9.0 9.1  GFRNONAA 48* 44*  --   --    < > >60 58*   < > >60 57* >60  GFRAA 56* 51*  --   --   --   --   --   --   --   --   --   PROT  --  9.1*   < > 8.2*   < > 8.9* 8.1  --   --   --  8.6*  ALBUMIN  --  3.5   < > 3.3*   < > 4.0 3.5  --   --   --  3.7  AST  --  20   < > 21   < > 21 22  --   --   --  20  ALT  --  20   < > 21   < > 18 20  --   --   --  18  ALKPHOS  --  186*   < > 130*   < > 118 142*  --   --   --  153*  BILITOT  --  1.2   < >  1.0   < > 1.1 0.9  --   --   --  0.6  BILIDIR  --   --   --  0.2  --   --   --   --   --   --   --   IBILI  --   --   --  0.8  --   --   --   --   --   --   --    < > = values in this interval not displayed.       RADIOGRAPHIC STUDIES: I have personally reviewed the radiological images as listed and agreed with the findings in the report. DG Chest 2  View  Result Date: 01/20/2021 CLINICAL DATA:  Shortness of breath EXAM: CHEST - 2 VIEW COMPARISON:  11/05/2020.  CT 07/22/2020. FINDINGS: Lateral view degraded by patient arm position. Midline trachea. Borderline cardiomegaly. No pleural effusion or pneumothorax. No lobar consolidation. Fine interstitial thickening is chronic over multiple prior exams. IMPRESSION: No acute process or interval change. Borderline cardiomegaly with chronic fine interstitial thickening. Although possibly related to interstitial edema, chronicity favors mild interstitial lung disease. If the patient has chronic shortness of breath, consider pulmonology outpatient referral for possible high-resolution chest CT. Electronically Signed   By: Abigail Miyamoto M.D.   On: 01/20/2021 18:43   US Venous Img Lower Unilateral Left (DVT)  Result Date: 01/13/2021 CLINICAL DATA:  Left lower extremity swelling X 1 day. EXAM: LEFT LOWER EXTREMITY VENOUS DOPPLER ULTRASOUND TECHNIQUE: Gray-scale sonography with compression, as well as color and duplex ultrasound, were performed to evaluate the deep venous system(s) from the level of the common femoral vein through the popliteal and proximal calf veins. COMPARISON:  None. FINDINGS: VENOUS Normal compressibility of the common femoral, superficial femoral, and popliteal veins, as well as the visualized calf veins. Visualized portions of profunda femoral vein and great saphenous vein unremarkable. No filling defects to suggest DVT on grayscale or color Doppler imaging. Doppler waveforms show normal direction of venous flow, normal respiratory plasticity and response to augmentation. Limited views of the contralateral common femoral vein are unremarkable. OTHER None. Limitations: none IMPRESSION: No deep venous thrombosis of the left lower extremity. Electronically Signed   By: Iven Finn M.D.   On: 01/13/2021 19:37   DG Foot Complete Left  Result Date: 01/13/2021 CLINICAL DATA:  Foot infection EXAM:  LEFT FOOT - COMPLETE 3+ VIEW COMPARISON:  None. FINDINGS: There is no evidence of fracture or dislocation. There is no evidence of arthropathy or other focal bone abnormality. Soft tissues are unremarkable. Large calcaneal enthesophytes. No osteolysis. IMPRESSION: Negative. Electronically Signed   By: Ulyses Jarred M.D.   On: 01/13/2021 19:15     ASSESSMENT & PLAN:   Carcinoma of lower-outer quadrant of right breast in female, estrogen receptor positive ([place]) # T2N0- ER/PR-positive; premenopausal currently on Anastrazole [stopped tam sec to poor tolerance].  Patient declines oophorectomy.  Continue Lupron Depo 11.25 mg every 3 months.  #Continue anastrozole + Lupron for now.  Tolerating fairly well.  Again reviewed with the patient the goal of treatment is cure however patient at moderate risk of recurrence given stage II cancer and the fact that she has been not been able to receive adjuvant chemotherapy-given all her comorbidities.  Would recommend imaging-CT chest abdomen pelvis given the risk of recurrence.  # Cellulitis- Left foot [2 weeks]- on doxycycline [see diarrhea]; s/p evaluation with wound care; discussed with PCP  # Acute diarrhea-  last 3-4 days [4 loose stool/rumbles]-we will get C. difficile testing.  # Headache/migraines-awaiting awaiting neurology evaluation do not suspect for malignancy; discussed with PCP.    #Depression/anxiety/bipolar-currently on BuSpar/Celexa/trazodone stable-As per psychiatry.  #Chronic respiratory failure-CHF/COPD-STABLE  #Poorly controlled diabetes-on insulin; AM- 233; overall stable;  followed endocrinology.  #Peripheral neuropathy grade 2-3; patient goes on with a walker/cane at baseline- STABLE.    #Genetics: Discuss  DISPOSITION: c.diff stool # Lupron today # follow up in 1 month- MD; labs- cbc/cmp/ca-27-29; CT C/A/P- Dr.B    All questions were answered. The patient knows to call the clinic with any problems, questions or concerns.     Cammie Sickle, MD 02/03/2021 8:22 PM

## 2021-02-03 NOTE — Telephone Encounter (Signed)
Received message below. Called patient to get more information. Left message to call back to office.

## 2021-02-03 NOTE — Progress Notes (Signed)
REXINE, GOWENS ([id number]) Visit Report for 02/02/2021 Chief Complaint Document Details Patient Name: [name], [name] Date of Service: 02/02/2021 8:15 AM Medical Record Number: [id number] Patient Account Number: [id number] Date of Birth/Sex: 23-Apr-1971 (50 y.o. F) Treating RN: Dolan Amen Primary Care Provider: Alma Friendly Other Clinician: Referring Provider: Alma Friendly Treating Provider/Extender: Skipper Cliche in Treatment: 0 Information Obtained from: Patient Chief Complaint Left Heel Ulcer Electronic Signature(s) Signed: 02/02/2021 8:54:30 AM By: Worthy Keeler PA-C Entered By: Worthy Keeler on 02/02/2021 08:54:30 Simar, [name] ([id number]) -------------------------------------------------------------------------------- Debridement Details Patient Name: [name] Date of Service: 02/02/2021 8:15 AM Medical Record Number: [id number] Patient Account Number: [id number] Date of Birth/Sex: 1971/07/02 (50 y.o. F) Treating RN: Dolan Amen Primary Care Provider: Alma Friendly Other Clinician: Referring Provider: Alma Friendly Treating Provider/Extender: Skipper Cliche in Treatment: 0 Debridement Performed for Wound #9 Left Calcaneus Assessment: Performed By: Physician Tommie Sams., PA-C Debridement Type: Debridement Level of Consciousness (Pre- Awake and Alert procedure): Pre-procedure Verification/Time Out Yes - 09:00 Taken: Start Time: 09:00 Total Area Debrided (L x W): 3 (cm) x 4 (cm) = 12 (cm) Tissue and other material Viable, Non-Viable, Subcutaneous, Skin: Epidermis debrided: Level: Skin/Subcutaneous Tissue Debridement Description: Excisional Instrument: Forceps, Scissors Bleeding: Minimum Hemostasis Achieved: Pressure Response to Treatment: Procedure was tolerated well Level of Consciousness (Post- Awake and Alert procedure): Post Debridement Measurements of Total Wound Length: (cm) 3 Stage: Category/Stage III Width: (cm)  4 Depth: (cm) 0.2 Volume: (cm) 1.885 Character of Wound/Ulcer Post Debridement: Stable Post Procedure Diagnosis Same as Pre-procedure Electronic Signature(s) Signed: 02/02/2021 3:39:23 PM By: Dolan Amen RN Signed: 02/03/2021 5:04:06 PM By: Worthy Keeler PA-C Entered By: Dolan Amen on 02/02/2021 09:03:49 Alberts, [name] ([id number]) -------------------------------------------------------------------------------- HPI Details Patient Name: [name] Date of Service: 02/02/2021 8:15 AM Medical Record Number: [id number] Patient Account Number: [id number] Date of Birth/Sex: 11-Sep-1970 (50 y.o. F) Treating RN: Dolan Amen Primary Care Provider: Alma Friendly Other Clinician: Referring Provider: Alma Friendly Treating Provider/Extender: Skipper Cliche in Treatment: 0 History of Present Illness HPI Description: ADMISSION 06/19/2019 Patient is a 50 year old woman who is accompanied by her husband. She has severe diabetic peripheral neuropathy which is left her minimally ambulatory. She spends most of the time in a wheelchair. She also has a history of chronic lower extremity edema history of left leg DVT. She states that on 2 separate occasions roughly a month ago she fell with lacerations on the bilateral anterior tibial areas. She was seen by primary care on 06/04/2019 diagnosed with cellulitis of the left leg put on Bactrim DS. Also noted to have hand wounds with a question of referral to hand surgery. She was also sent to our clinic at the same time. The patient's husband is doing the dressings. He has been using silver alginate that was supplied by home health. He is done a really good job the area on the left leg is actually healed only a small superficial area on the right Somewhere in the in the last 2 weeks she gives a bizarre history of going to wash nail polish off the fingers of both hands. She apparently did not realize that her son had put Clorox in the  vinegar jar that they used to clean the countertop. She ended up burning her hands. The patient has a wound on the PIP of the left third finger, PIP of the right second finger and the lateral part of the right third finger Past medical history includes COPD, obstructive sleep apnea on  nocturnal O2, type 2 diabetes with peripheral neuropathy, retinopathy, chronic diastolic heart failure coronary artery disease, severe diabetic neuropathy type 2 diabetes apparently diagnosed at age 62 on insulin, COPD, continued tobacco abuse, left leg deep DVT, hypertension, TIAs, seizure disorders, asthma The patient is actually had arterial studies in February of this year. ABIs bilaterally were 1.02 and TBI's bilaterally at 0.6. Waveforms were triphasic and biphasic. She was not felt to have significant arterial disease. 12/23; patient readmitted the clinic last week. She has predominantly venous insufficiency wounds on the right medial lower leg. She also had open areas on the fingers of both hands which happened in a very bizarre way. She has almost complete healing on the right medial calf. The area on the left third finger is healed. She still has 2 open wounds on the medial aspect of the PIP of the right second finger and the dorsal aspect of the right PIP of the third finger. Of these 2 wounds the PIP of the third finger wound is painful and deeper 12/30; the areas on her right medial calf are closed. She likely has some degree of chronic venous insufficiency and lymphedema. Also very xerotic skin which I have recommended a moisturizing. I did not bring up the issue of compression stockings The areas we are still looking out are on the PIP of the right third finger and the medial part of the PIP of the right second finger. The second finger area is just about closed. The problem is over the PIP of the right third finger. This does not look infected and she is completing her antibiotics from last week. However  this is a deep wound at the base of this there is either tendon or periosteum. She she has some healthy granulation but we are not seeing that close over the bottom part of the wound. We are probably going to need to immobilize the finger somewhat so that she does not continuously pull the tissue apart 07/17/2019; right medial calf remains closed and the right second finger is closed. The sole problem here is an area over the PIP of the third finger. These wounds were initially advertised as burn injuries that happened in a very bizarre way [please see my initial discussion on this]. I gave her doxycycline last week because of some erythema around the wound however she did not tolerate this very well because of nausea. She stopped this within the last day or 2. we have been using silver collagen 1/13; the only wound we are looking at is the right third finger over the PIP. We are using endoform. X-ray ordered last time showed no underlying bone abnormalities 1/20; right third finger over the PIP. Still debris at the surface we have been using endoform. We are making some improvements in surface area and volume but still requiring debridement. 1/27; right third finger over the PIP. Still debris on the surface of the wound and maceration. We have been using endoform. This wound initially went to the periosteum and it certainly is off that but is certainly stalled over the last 2 or 3 weeks 2/10; right third finger over the PIP. We switch to Iodoflex last time but she says this caused extreme erythema and blistering and they changed back to endoform. This is a wound that originally went to the periosteum. This is improved since her admission but really it is stalled. She missed her appointment last week but she states she has been using endoform. She complains of increasing  pain in the finger that is making it difficult for her to sleep at times 2/17; right third finger over the PIP. This is a difficult  wound small punched out area. Culture I did last week showed methicillin sensitive staph aureus however she is allergic to penicillin [widespread rash as a child] she has not had cephalosporins. Multiple drug interactions with quinolones and I was concerned about trimethoprim sulfamethoxazole in somebody with ACE inhibitors and spironolactone. I prescribed doxycycline. This was 2 days ago. She comes in today without the antibiotic saying that it caused mental status changes fatigue and diarrhea the last time she was on this. 2/24; right third finger over the PIP. She is tolerating the doxycycline has another 3 days left. This is for the area on the PIP of the third finger on her right. She arrives in clinic today with a thick raised eschar over the PIP of the left third finger. I talked to her about this. The exact reason is unclear. She does smoke but denies it could be a burn injury. Her husband thinks this may be trauma pushing her wheelchair but the depth of this makes this somewhat unusual. She does not appear to have any blood flow issues in either arm Readmission: 06/29/2020 upon evaluation today patient appears to be doing somewhat poorly in regard to her left lower extremity. She has a couple open areas here which appear to be venous in nature. One of them is a more recent trauma but nonetheless both are very superficial. She does have a lot of lymphedema/stasis type weeping from the wound opening which I think is preventing her from healing. She has been trying to work on this [name], [name] ([id number]) on her own but unfortunately just is not getting the results she needs as far as getting the swelling under control to allow these wounds to heal. Currently the patient tells me as well that she has been undergoing treatment for breast cancer on the right. I think she has a left mastectomy as well. She also has COPD. Her husband is actually taking very good care of her and doing what he can  at this point in time. Patient does have hospice coming out to see her as well. Readmission: 02/02/21 second married issues that she was having with cellulitis. She tells me currently that she was in the hospital for several days and subsequently it was during that time that she developed a wound on her left heel. Initially this was thought to just be a blister and she was told not to pop it. Subsequently upon getting home she notes that this started to worsen an opening drain. That's when she decided to contact the wound center as we've seen her previously and taking care of ulcerations for her. Upon evaluation today the patient actually showed signs of what appears to be a pressure injury stage III to the left heel. There does not appear to be any significant depth although there's just one spot that goes down to the subcutaneous layer were there some Slough noted there is a lot of blistered skin and callous tissue that is going to need to be removed help this to heal properly. The patient voiced understanding. Otherwise her medical history per above really has not changed intricately at this point. The patient was started on Doxy cycling two days ago Electronic Signature(s) Signed: 02/03/2021 5:04:06 PM By: Worthy Keeler PA-C Previous Signature: 02/02/2021 6:14:51 PM Version By: Worthy Keeler PA-C Entered By: Melburn Hake,  Margarita Grizzle on 02/03/2021 07:44:22 [name], [name] ([id number]) -------------------------------------------------------------------------------- Physical Exam Details Patient Name: [name], [name] Date of Service: 02/02/2021 8:15 AM Medical Record Number: [id number] Patient Account Number: [id number] Date of Birth/Sex: 12/21/1970 (50 y.o. F) Treating RN: Dolan Amen Primary Care Provider: Alma Friendly Other Clinician: Referring Provider: Alma Friendly Treating Provider/Extender: Skipper Cliche in Treatment: 0 Constitutional patient is hypertensive.. pulse regular and  within target range for patient.Marland Kitchen respirations regular, non-labored and within target range for patient.Marland Kitchen temperature within target range for patient.. Well-nourished and well-hydrated in no acute distress. Eyes conjunctiva clear no eyelid edema noted. pupils equal round and reactive to light and accommodation. Ears, Nose, Mouth, and Throat no gross abnormality of ear auricles or external auditory canals. normal hearing noted during conversation. mucus membranes moist. Respiratory normal breathing without difficulty. Cardiovascular 2+ dorsalis pedis/posterior tibialis pulses. trace pitting edema of the bilateral lower extremities. Musculoskeletal normal gait and posture. Psychiatric this patient is able to make decisions and demonstrates good insight into disease process. Alert and Oriented x 3. pleasant and cooperative. Notes Upon evaluation today patient actually appears to be doing extremely well all things considered with regard [place]. I don't any signs of infection the doxycycline may have already been doing a great job for her which is great news. With that being said the thing that I do see is that she does seem to have some callous which is lifting up a blister tissue around the edges of the wound which is going to need to be trimmed away. There is also minimal amount of sloth in one area that I think we do need to debride. She is in agreement with this plan that was proceeded with today in the patient tolerated the debridement without complication. Most agreement wound bed appears to be doing significantly better. Electronic Signature(s) Signed: 02/03/2021 5:04:06 PM By: Worthy Keeler PA-C Entered By: Worthy Keeler on 02/03/2021 07:47:21 [name], [name] ([id number]) -------------------------------------------------------------------------------- Physician Orders Details Patient Name: [name] Date of Service: 02/02/2021 8:15 AM Medical Record Number: [id number] Patient  Account Number: [id number] Date of Birth/Sex: 1971-03-05 (50 y.o. 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 Bathing/ Shower/ Hygiene o May shower; gently cleanse wound with antibacterial soap, rinse and pat dry prior to dressing wounds Wound Treatment Wound #9 - Calcaneus Wound Laterality: Left Cleanser: [employer] Ancillary Kit - 15 Day Supply (DME) (Generic) 1 x Per Day/30 Days Discharge Instructions: Use supplies as instructed; Kit contains: (15) Saline Bullets; (15) 3x3 Gauze; 15 pr Gloves Primary Dressing: Xeroform 4x4-HBD (in/in) (Generic) 1 x Per Day/30 Days Discharge Instructions: Apply Xeroform 4x4-HBD (in/in) as directed Secondary Dressing: Conforming Guaze Roll-Large (DME) (Generic) 1 x Per Day/30 Days Discharge Instructions: Secure dressing Secondary Dressing: Gauze (DME) (Generic) 1 x Per Day/30 Days Discharge Instructions: Apply dry gauze over dressing Secured With: 62M Medipore H Soft Cloth Surgical Tape, 2x2 (in/yd) (DME) (Generic) 1 x Per Day/30 Days Discharge Instructions: Secure conform gauze Electronic Signature(s) Signed: 02/02/2021 3:39:23 PM By: Dolan Amen RN Signed: 02/03/2021 5:04:06 PM By: Worthy Keeler PA-C Entered By: Laurin Coder ,  [place] on 02/02/2021 09:16:13 [name], [name] ([id number]) -------------------------------------------------------------------------------- Problem List Details Patient Name: [name], [name] Date of Service:  02/02/2021 8:15 AM Medical Record Number: [id number] Patient Account Number: [id number] Date of Birth/Sex: September 29, 1970 (50 y.o. F) Treating RN: Dolan Amen Primary Care Provider: Alma Friendly Other Clinician: Referring Provider: Alma Friendly Treating Provider/Extender: Skipper Cliche in Treatment: 0 Active Problems ICD-10 Encounter Code Description Active Date MDM Diagnosis [phone] Pressure ulcer of left heel, stage 3 02/02/2021 No Yes L03.116 Cellulitis of left lower limb 02/02/2021 No Yes I87.2 Venous insufficiency (chronic) (peripheral) 02/02/2021 No Yes D05.81 Other specified type of carcinoma in situ of right breast 02/02/2021 No Yes J44.9 Chronic obstructive pulmonary disease, unspecified 02/02/2021 No Yes Inactive Problems Resolved Problems Electronic Signature(s) Signed: 02/02/2021 3:39:23 PM By: Dolan Amen RN Signed: 02/03/2021 5:04:06 PM By: Worthy Keeler PA-C Previous Signature: 02/02/2021 8:54:12 AM Version By: Worthy Keeler PA-C Entered By: Dolan Amen on 02/02/2021 09:00:16 Schaff, [name] ([id number]) -------------------------------------------------------------------------------- Progress Note Details Patient Name: [name] Date of Service: 02/02/2021 8:15 AM Medical Record Number: [id number] Patient Account Number: [id number] Date of Birth/Sex: 1971/03/22 (50 y.o. F) Treating RN: Dolan Amen Primary Care Provider: Alma Friendly Other Clinician: Referring Provider: Alma Friendly Treating Provider/Extender: Skipper Cliche in Treatment: 0 Subjective Chief Complaint Information obtained from Patient Left Heel Ulcer History of Present Illness (HPI) ADMISSION 06/19/2019 Patient is a 50 year old woman who is accompanied by her husband. She has severe diabetic peripheral neuropathy which is left her minimally ambulatory. She spends most of the time in a wheelchair. She also has a history of chronic lower extremity edema history of left  leg DVT. She states that on 2 separate occasions roughly a month ago she fell with lacerations on the bilateral anterior tibial areas. She was seen by primary care on 06/04/2019 diagnosed with cellulitis of the left leg put on Bactrim DS. Also noted to have hand wounds with a question of referral to hand surgery. She was also sent to our clinic at the same time. The patient's husband is doing the dressings. He has been using silver alginate that was supplied by home health. He is done a really good job the area on the left leg is actually healed only a small superficial area on the right Somewhere in the in the last 2 weeks she gives a bizarre history of going to wash nail polish off the fingers of both hands. She apparently did not realize that her son had put Clorox in the vinegar jar that they used to clean the countertop. She ended up burning her hands. The patient has a wound on the PIP of the left third finger, PIP of the right second finger and the lateral part of the right third finger Past medical history includes COPD, obstructive sleep apnea on nocturnal O2, type 2 diabetes with peripheral neuropathy, retinopathy, chronic diastolic heart failure coronary artery disease, severe diabetic neuropathy type 2 diabetes apparently diagnosed at age 41 on insulin, COPD, continued tobacco abuse, left leg deep DVT, hypertension, TIAs, seizure disorders, asthma The patient is actually had arterial studies in February of this year. ABIs bilaterally were 1.02 and TBI's bilaterally at 0.6. Waveforms were triphasic and biphasic. She was not felt to have significant arterial disease. 12/23; patient readmitted the clinic last week. She has predominantly venous insufficiency wounds on the right medial lower leg. She also had open areas on the fingers of both hands which happened in a very bizarre way. She has almost complete  healing on the right medial calf. The area on the left third finger is healed. She  still has 2 open wounds on the medial aspect of the PIP of the right second finger and the dorsal aspect of the right PIP of the third finger. Of these 2 wounds the PIP of the third finger wound is painful and deeper 12/30; the areas on her right medial calf are closed. She likely has some degree of chronic venous insufficiency and lymphedema. Also very xerotic skin which I have recommended a moisturizing. I did not bring up the issue of compression stockings The areas we are still looking out are on the PIP of the right third finger and the medial part of the PIP of the right second finger. The second finger area is just about closed. The problem is over the PIP of the right third finger. This does not look infected and she is completing her antibiotics from last week. However this is a deep wound at the base of this there is either tendon or periosteum. She she has some healthy granulation but we are not seeing that close over the bottom part of the wound. We are probably going to need to immobilize the finger somewhat so that she does not continuously pull the tissue apart 07/17/2019; right medial calf remains closed and the right second finger is closed. The sole problem here is an area over the PIP of the third finger. These wounds were initially advertised as burn injuries that happened in a very bizarre way [please see my initial discussion on this]. I gave her doxycycline last week because of some erythema around the wound however she did not tolerate this very well because of nausea. She stopped this within the last day or 2. we have been using silver collagen 1/13; the only wound we are looking at is the right third finger over the PIP. We are using endoform. X-ray ordered last time showed no underlying bone abnormalities 1/20; right third finger over the PIP. Still debris at the surface we have been using endoform. We are making some improvements in surface area and volume but still  requiring debridement. 1/27; right third finger over the PIP. Still debris on the surface of the wound and maceration. We have been using endoform. This wound initially went to the periosteum and it certainly is off that but is certainly stalled over the last 2 or 3 weeks 2/10; right third finger over the PIP. We switch to Iodoflex last time but she says this caused extreme erythema and blistering and they changed back to endoform. This is a wound that originally went to the periosteum. This is improved since her admission but really it is stalled. She missed her appointment last week but she states she has been using endoform. She complains of increasing pain in the finger that is making it difficult for her to sleep at times 2/17; right third finger over the PIP. This is a difficult wound small punched out area. Culture I did last week showed methicillin sensitive staph aureus however she is allergic to penicillin [widespread rash as a child] she has not had cephalosporins. Multiple drug interactions with quinolones and I was concerned about trimethoprim sulfamethoxazole in somebody with ACE inhibitors and spironolactone. I prescribed doxycycline. This was 2 days ago. She comes in today without the antibiotic saying that it caused mental status changes fatigue and diarrhea the last time she was on this. 2/24; right third finger over the PIP. She is  tolerating the doxycycline has another 3 days left. This is for the area on the PIP of the third finger on her right. She arrives in clinic today with a thick raised eschar over the PIP of the left third finger. I talked to her about this. The exact reason is unclear. She does smoke but denies it could be a burn injury. Her husband thinks this may be trauma pushing her wheelchair but the depth of this makes this somewhat unusual. She does not appear to have any blood flow issues in either arm [name], [name] ([id number]) Readmission: 06/29/2020 upon  evaluation today patient appears to be doing somewhat poorly in regard to her left lower extremity. She has a couple open areas here which appear to be venous in nature. One of them is a more recent trauma but nonetheless both are very superficial. She does have a lot of lymphedema/stasis type weeping from the wound opening which I think is preventing her from healing. She has been trying to work on this on her own but unfortunately just is not getting the results she needs as far as getting the swelling under control to allow these wounds to heal. Currently the patient tells me as well that she has been undergoing treatment for breast cancer on the right. I think she has a left mastectomy as well. She also has COPD. Her husband is actually taking very good care of her and doing what he can at this point in time. Patient does have hospice coming out to see her as well. Readmission: 02/02/21 second married issues that she was having with cellulitis. She tells me currently that she was in the hospital for several days and subsequently it was during that time that she developed a wound on her left heel. Initially this was thought to just be a blister and she was told not to pop it. Subsequently upon getting home she notes that this started to worsen an opening drain. That's when she decided to contact the wound center as we've seen her previously and taking care of ulcerations for her. Upon evaluation today the patient actually showed signs of what appears to be a pressure injury stage III to the left heel. There does not appear to be any significant depth although there's just one spot that goes down to the subcutaneous layer were there some Slough noted there is a lot of blistered skin and callous tissue that is going to need to be removed help this to heal properly. The patient voiced understanding. Otherwise her medical history per above really has not changed intricately at this point. The patient  was started on Doxy cycling two days ago Patient History Information obtained from Patient. Allergies penicillin (Severity: Moderate, Reaction: rash), Singulair (Severity: Severe, Reaction: tongue swelling), metoprolol, morphine (Severity: Severe, Reaction: face swelling, unable to swallow), prednisone (Severity: Severe, Reaction: throat swelling), diltiazem Family History Cancer - Siblings, Diabetes - Siblings,Father,Mother, Heart Disease - Siblings,Father,Mother, Hypertension - Mother,Father,Siblings, Lung Disease - Mother, Seizures - Mother, Stroke - Mother,Siblings,Father, No family history of Hereditary Spherocytosis, Kidney Disease, Thyroid Problems, Tuberculosis. Social History Current every day smoker - 40 years, Marital Status - Married, Alcohol Use - Never, Drug Use - No History, Caffeine Use - Daily - coffee, soda. Medical History Eyes Denies history of Cataracts, Glaucoma, Optic Neuritis Ear/Nose/Mouth/Throat Denies history of Chronic sinus problems/congestion, Middle ear problems Hematologic/Lymphatic Denies history of Anemia, Hemophilia, Human Immunodeficiency Virus, Lymphedema, Sickle Cell Disease Respiratory Patient has history of Asthma, Chronic Obstructive Pulmonary Disease (  COPD), Sleep Apnea Cardiovascular Patient has history of Arrhythmia - sinus tachycardia, Congestive Heart Failure, Coronary Artery Disease, Hypertension Gastrointestinal Denies history of Cirrhosis , Colitis, Crohn s, Hepatitis A, Hepatitis B, Hepatitis C Endocrine Patient has history of Type II Diabetes Genitourinary Denies history of End Stage Renal Disease Immunological Denies history of Lupus Erythematosus, Raynaud s, Scleroderma Integumentary (Skin) Denies history of History of Burn, History of pressure wounds Musculoskeletal Denies history of Gout, Rheumatoid Arthritis, Osteoarthritis, Osteomyelitis Neurologic Patient has history of Neuropathy - legs, feet and hands Denies history of  Dementia, Quadriplegia, Paraplegia, Seizure Disorder Oncologic Patient has history of Received Chemotherapy, Received Radiation Psychiatric Denies history of Anorexia/bulimia, Confinement Anxiety Medical And Surgical History Notes Neurologic Stroke 2020 Left weakness Oncologic Breast cancer-right mastectomy [name], [name] ([id number]) Objective Constitutional patient is hypertensive.. pulse regular and within target range for patient.Marland Kitchen respirations regular, non-labored and within target range for patient.Marland Kitchen temperature within target range for patient.. Well-nourished and well-hydrated in no acute distress. Vitals Time Taken: 8:32 AM, Height: 64 in, Source: Stated, Weight: 242 lbs, Source: Measured, BMI: 41.5, Temperature: 98.1 F, Pulse: 71 bpm, Respiratory Rate: 18 breaths/min, Blood Pressure: 138/83 mmHg. Eyes conjunctiva clear no eyelid edema noted. pupils equal round and reactive to light and accommodation. Ears, Nose, Mouth, and Throat no gross abnormality of ear auricles or external auditory canals. normal hearing noted during conversation. mucus membranes moist. Respiratory normal breathing without difficulty. Cardiovascular 2+ dorsalis pedis/posterior tibialis pulses. trace pitting edema of the bilateral lower extremities. Musculoskeletal normal gait and posture. Psychiatric this patient is able to make decisions and demonstrates good insight into disease process. Alert and Oriented x 3. pleasant and cooperative. General Notes: Upon evaluation today patient actually appears to be doing extremely well all things considered with regard [place]. I don't any signs of infection the doxycycline may have already been doing a great job for her which is great news. With that being said the thing that I do see is that she does seem to have some callous which is lifting up a blister tissue around the edges of the wound which is going to need to be trimmed away. There is also minimal  amount of sloth in one area that I think we do need to debride. She is in agreement with this plan that was proceeded with today in the patient tolerated the debridement without complication. Most agreement wound bed appears to be doing significantly better. Integumentary (Hair, Skin) Wound #9 status is Open. Original cause of wound was Blister. The date acquired was: 01/13/2021. The wound is located on the Left Calcaneus. The wound measures 3cm length x 4cm width x 0.1cm depth; 9.425cm^2 area and 0.942cm^3 volume. There is Fat Layer (Subcutaneous Tissue) exposed. There is no tunneling or undermining noted. There is a medium amount of serosanguineous drainage noted. There is medium (34-66%) pink granulation within the wound bed. There is a medium (34-66%) amount of necrotic tissue within the wound bed including Adherent Slough. 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 There was a Excisional Skin/Subcutaneous Tissue Debridement with a total area of 12 sq cm performed by Tommie Sams., PA-C. With the following instrument(s): Forceps, and Scissors to remove Viable and Non-Viable tissue/material. Material removed includes Subcutaneous  Tissue and Skin: Epidermis and. A time out was conducted at 09:00, prior to the start of the procedure. A Minimum amount of bleeding was controlled with Pressure. The procedure was tolerated well. Post Debridement Measurements: 3cm length x 4cm width x 0.2cm depth; 1.885cm^3 volume. Post debridement Stage noted as Category/Stage III. [name], [name] ([id number]) Character of Wound/Ulcer Post Debridement is stable. Post procedure Diagnosis Wound #9: Same  as Pre-Procedure Plan Follow-up Appointments: Return Appointment in 1 week. Bathing/ Shower/ Hygiene: May shower; gently cleanse wound with antibacterial soap, rinse and pat dry prior to dressing wounds WOUND #9: - Calcaneus Wound Laterality: Left Cleanser: [place] Ancillary Kit - 15 Day Supply (DME) (Generic) 1 x Per Day/30 Days Discharge Instructions: Use supplies as instructed; Kit contains: (15) Saline Bullets; (15) 3x3 Gauze; 15 pr Gloves Primary Dressing: Xeroform 4x4-HBD (in/in) (Generic) 1 x Per Day/30 Days Discharge Instructions: Apply Xeroform 4x4-HBD (in/in) as directed Secondary Dressing: Conforming Guaze Roll-Large (DME) (Generic) 1 x Per Day/30 Days Discharge Instructions: Secure dressing Secondary Dressing: Gauze (DME) (Generic) 1 x Per Day/30 Days Discharge Instructions: Apply dry gauze over dressing Secured With: 82M Medipore H Soft Cloth Surgical Tape, 2x2 (in/yd) (DME) (Generic) 1 x Per Day/30 Days Discharge Instructions: Secure conform gauze 1. At this point based on what I see I think that the ideal thing would be for [place] to go ahead and use Xeroform for the patient as the wound is not appear to be too deep but I do not want this to dry out too much. I considered a collagen dressing but I don't believe that's necessary currently. 2. I also suggested that she continue with the doxycycline though I do not see any obvious signs of infection at this point I think that something to definitely keep a close eye on to be honest. 3. With regard to offloading I felt like the patient did need to be very diligent about keeping pressure off of this area especially when bad or when just sitting in her chair or otherwise. I think a pillow under the Region will help to float this heal and prevent it from breaking down. I also had a discussion with her about her crocks which I feel like you're probably rubbing the area he tells me "yeah it hurts when it rubs". Obviously this is going to slow  down her healing and I think that she needs to avoid using the crocs. Please see above for specific wound care orders. We will see patient for re-evaluation in 1 week(s) here in the clinic. If anything worsens or changes patient will contact our office for additional recommendations. Electronic Signature(s) Signed: 02/03/2021 5:04:06 PM By: Worthy Keeler PA-C Entered By: Worthy Keeler on 02/03/2021 07:50:10 [place], [name] ([id number]) -------------------------------------------------------------------------------- ROS/PFSH Details Patient Name: [name] Date of Service: 02/02/2021 8:15 AM Medical Record Number: [id number] Patient Account Number: [id number] Date of Birth/Sex: 1970/08/20 (50 y.o. F) Treating RN: Dolan Amen Primary Care Provider: Alma Friendly Other Clinician: Referring Provider: Alma Friendly Treating Provider/Extender: Skipper Cliche in Treatment: 0 Information Obtained From Patient Eyes Medical History: Negative for: Cataracts; Glaucoma; Optic Neuritis Ear/Nose/Mouth/Throat Medical History: Negative for: Chronic sinus problems/congestion; Middle ear problems Hematologic/Lymphatic Medical History: Negative for: Anemia; Hemophilia; Human Immunodeficiency Virus; Lymphedema; Sickle Cell Disease Respiratory Medical History: Positive for: Asthma; Chronic Obstructive Pulmonary Disease (COPD); Sleep Apnea Cardiovascular Medical History: Positive for: Arrhythmia - sinus tachycardia; Congestive Heart Failure; Coronary Artery Disease; Hypertension Gastrointestinal Medical History: Negative for: Cirrhosis ; Colitis; Crohnos; Hepatitis A; Hepatitis  B; Hepatitis C Endocrine Medical History: Positive for: Type II Diabetes Time with diabetes: 30 years Treated with: Insulin, Oral agents Blood sugar tested every day: Yes Tested : daily Blood sugar testing results: Breakfast: 225 Genitourinary Medical History: Negative for: End Stage Renal  Disease Immunological Medical History: Negative for: Lupus Erythematosus; Raynaudos; Scleroderma Integumentary (Skin) Medical History: Negative for: History of Burn; History of pressure wounds [name], [name] ([id number]) Musculoskeletal Medical History: Negative for: Gout; Rheumatoid Arthritis; Osteoarthritis; Osteomyelitis Neurologic Medical History: Positive for: Neuropathy - legs, feet and hands Negative for: Dementia; Quadriplegia; Paraplegia; Seizure Disorder Past Medical History Notes: Stroke 2020 Left weakness Oncologic Medical History: Positive for: Received Chemotherapy; Received Radiation Past Medical History Notes: Breast cancer-right mastectomy Psychiatric Medical History: Negative for: Anorexia/bulimia; Confinement Anxiety Immunizations Pneumococcal Vaccine: Received Pneumococcal Vaccination: Yes Received Pneumococcal Vaccination On or After 60th Birthday: No Implantable Devices None Family and Social History Cancer: Yes - Siblings; Diabetes: Yes - Siblings,Father,Mother; Heart Disease: Yes - Siblings,Father,Mother; Hereditary Spherocytosis: No; Hypertension: Yes - Mother,Father,Siblings; Kidney Disease: No; Lung Disease: Yes - Mother; Seizures: Yes - Mother; Stroke: Yes - Mother,Siblings,Father; Thyroid Problems: No; Tuberculosis: No; Current every day smoker - 40 years; Marital Status - Married; Alcohol Use: Never; Drug Use: No History; Caffeine Use: Daily - coffee, soda; Financial Concerns: No; Food, Clothing or Shelter Needs: No; Support System Lacking: No; Transportation Concerns: No Electronic Signature(s) Signed: 02/02/2021 3:39:23 PM By: Dolan Amen RN Signed: 02/03/2021 5:04:06 PM By: Worthy Keeler PA-C Entered By: Dolan Amen on 02/02/2021 08:36:45 [place], [name] ([id number]) -------------------------------------------------------------------------------- SuperBill Details Patient Name: [name] Date of Service: 02/02/2021 Medical Record  Number: [id number] Patient Account Number: [id number] Date of Birth/Sex: [birth year] (50 y.o. 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 J44.9 Chronic obstructive pulmonary disease, unspecified Facility Procedures CPT4 Code: 00379444 Description: [id number] - WOUND CARE VISIT-LEV 2 EST PT Modifier: Quantity: 1 CPT4 Code: 22241146 Description: 43142 - DEB SUBQ TISSUE 20 SQ CM/< Modifier: Quantity: 1 CPT4 Code: Description: ICD-10 Diagnosis Description L89.623 Pressure ulcer of left heel, stage 3 Modifier: Quantity: Physician Procedures CPT4 Code: 7670110 Description: 03496 - WC PHYS LEVEL 4 - EST PT Modifier: 25 Quantity: 1 CPT4 Code: Description: ICD-10 Diagnosis Description L89.623 Pressure ulcer of left heel, stage 3 L03.116 Cellulitis of left lower limb I87.2 Venous insufficiency (chronic) (peripheral) D05.81 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

## 2021-02-03 NOTE — Telephone Encounter (Signed)
F/u  Returning call back to nurse.  

## 2021-02-03 NOTE — Assessment & Plan Note (Addendum)
#   T2N0- ER/PR-positive; premenopausal currently on Anastrazole [stopped tam sec to poor tolerance].  Patient declines oophorectomy.  Continue Lupron Depo 11.25 mg every 3 months.  #Continue anastrozole + Lupron for now.  Tolerating fairly well.  Again reviewed with the patient the goal of treatment is cure however patient at moderate risk of recurrence given stage II cancer and the fact that she has been not been able to receive adjuvant chemotherapy-given all her comorbidities.  Would recommend imaging-CT chest abdomen pelvis given the risk of recurrence.  # Cellulitis- Left foot [2 weeks]- on doxycycline [see diarrhea]; s/p evaluation with wound care; discussed with PCP  # Acute diarrhea- last 3-4 days [4 loose stool/rumbles]-we will get C. difficile testing.  # Headache/migraines-awaiting awaiting neurology evaluation do not suspect for malignancy; discussed with PCP.    #Depression/anxiety/bipolar-currently on BuSpar/Celexa/trazodone stable-As per psychiatry.  #Chronic respiratory failure-CHF/COPD-STABLE  #Poorly controlled diabetes-on insulin; AM- 233; overall stable;  followed endocrinology.  #Peripheral neuropathy grade 2-3; patient goes on with a walker/cane at baseline- STABLE.    #Genetics: Discuss  DISPOSITION: c.diff stool # Lupron today # follow up in 1 month- MD; labs- cbc/cmp/ca-27-29; CT C/A/P- Dr.B

## 2021-02-04 LAB — CANCER ANTIGEN 27.29: CA 27.29: 26.9 U/mL (ref 0.0–38.6)

## 2021-02-04 NOTE — Telephone Encounter (Signed)
[name] called in due to she missed 2 phone calls from [facility], she stated that she was at the cancer center and couldn't answer.

## 2021-02-04 NOTE — Telephone Encounter (Signed)
Noted. Please tell her to refrain from taking any more antibiotics and to complete the stool specimen that her cancer doctor ordered yesterday.   She is to follow up with the wound clinic for her wound.

## 2021-02-04 NOTE — Telephone Encounter (Signed)
Called patient let her know all information. She is not going back to cancer center for two weeks. Informed that we did not want to wait that long for her to return the stool sample. Wanted to know if she can bring to our office because it is closer to her home. Not sure if that can be done so I told her I would check and call back.

## 2021-02-04 NOTE — Telephone Encounter (Signed)
Spoke to pt. I advised her that [name] did not recommend cutting the Doxy in half. Said the loose stools started about 30 minutes after she took her 1st dose of Doxycycline whole tablet. So she broke it in half on the next dose hoping that would help prevent the loose stools, but it did not. She stopped taking the Doxy 02-02-21.

## 2021-02-04 NOTE — Telephone Encounter (Signed)
Spoke with her lab who states that she will need to pick up our kit and return to [place].  I am happy to place the order if she can pick up and drop off our kit to our office.

## 2021-02-05 ENCOUNTER — Telehealth: Payer: Self-pay | Admitting: *Deleted

## 2021-02-05 NOTE — Telephone Encounter (Signed)
Noted, lab test ordered.

## 2021-02-05 NOTE — Telephone Encounter (Signed)
Spoke with patient and she states that she will come by the office today and pick up kit.

## 2021-02-05 NOTE — Telephone Encounter (Signed)
Patient had question regarding AVS which I answered

## 2021-02-07 DIAGNOSIS — I5032 Chronic diastolic (congestive) heart failure: Secondary | ICD-10-CM | POA: Diagnosis not present

## 2021-02-07 DIAGNOSIS — I130 Hypertensive heart and chronic kidney disease with heart failure and stage 1 through stage 4 chronic kidney disease, or unspecified chronic kidney disease: Secondary | ICD-10-CM | POA: Diagnosis not present

## 2021-02-07 DIAGNOSIS — E1165 Type 2 diabetes mellitus with hyperglycemia: Secondary | ICD-10-CM | POA: Diagnosis not present

## 2021-02-08 ENCOUNTER — Other Ambulatory Visit: Payer: Self-pay

## 2021-02-08 ENCOUNTER — Other Ambulatory Visit: Payer: Medicare Other

## 2021-02-08 ENCOUNTER — Encounter: Payer: BC Managed Care – PPO | Attending: Physician Assistant | Admitting: Physician Assistant

## 2021-02-08 DIAGNOSIS — L89623 Pressure ulcer of left heel, stage 3: Secondary | ICD-10-CM | POA: Insufficient documentation

## 2021-02-08 DIAGNOSIS — E11621 Type 2 diabetes mellitus with foot ulcer: Secondary | ICD-10-CM | POA: Diagnosis not present

## 2021-02-08 DIAGNOSIS — E11622 Type 2 diabetes mellitus with other skin ulcer: Secondary | ICD-10-CM | POA: Diagnosis not present

## 2021-02-08 DIAGNOSIS — R197 Diarrhea, unspecified: Secondary | ICD-10-CM | POA: Diagnosis not present

## 2021-02-08 NOTE — Progress Notes (Addendum)
SHONI, KOLSTAD ([id number]) Visit Report for 02/08/2021 Chief Complaint Document Details Patient Name: [name], [name] Date of Service: 02/08/2021 11:15 AM Medical Record Number: [id number] Patient Account Number: [id number] Date of Birth/Sex: 11-Apr-1971 (50 y.o. F) Treating RN: Cornell Barman Primary Care Provider: Alma Friendly Other Clinician: Referring Provider: Alma Friendly Treating Provider/Extender: Skipper Cliche in Treatment: 0 Information Obtained from: Patient Chief Complaint Left Heel Ulcer Electronic Signature(s) Signed: 02/08/2021 11:27:46 AM By: Worthy Keeler PA-C Entered By: Worthy Keeler on 02/08/2021 11:27:46 [name], [name] -------------------------------------------------------------------------------- HPI Details Patient Name: [name] Date of Service: 02/08/2021 11:15 AM Medical Record Number: [id number] Patient Account Number: [id number] Date of Birth/Sex: 04-20-1971 (50 y.o. F) Treating RN: Cornell Barman Primary Care Provider: Alma Friendly Other Clinician: Referring Provider: Alma Friendly Treating Provider/Extender: Skipper Cliche in Treatment: 0 History of Present Illness HPI Description: ADMISSION 06/19/2019 Patient is a 51 year old woman who is accompanied by her husband. She has severe diabetic peripheral neuropathy which is left her minimally ambulatory. She spends most of the time in a wheelchair. She also has a history of chronic lower extremity edema history of left leg DVT. She states that on 2 separate occasions roughly a month ago she fell with lacerations on the bilateral anterior tibial areas. She was seen by primary care on 06/04/2019 diagnosed with cellulitis of the left leg put on Bactrim DS. Also noted to have hand wounds with a question of referral to hand surgery. She was also sent to our clinic at the same time. The patient's husband is doing the dressings. He has been using silver alginate that was supplied by home  health. He is done a really good job the area on the left leg is actually healed only a small superficial area on the right Somewhere in the in the last 2 weeks she gives a bizarre history of going to wash nail polish off the fingers of both hands. She apparently did not realize that her son had put Clorox in the vinegar jar that they used to clean the countertop. She ended up burning her hands. The patient has a wound on the PIP of the left third finger, PIP of the right second finger and the lateral part of the right third finger Past medical history includes COPD, obstructive sleep apnea on nocturnal O2, type 2 diabetes with peripheral neuropathy, retinopathy, chronic diastolic heart failure coronary artery disease, severe diabetic neuropathy type 2 diabetes apparently diagnosed at age 19 on insulin, COPD, continued tobacco abuse, left leg deep DVT, hypertension, TIAs, seizure disorders, asthma The patient is actually had arterial studies in February of this year. ABIs bilaterally were 1.02 and TBI's bilaterally at 0.6. Waveforms were triphasic and biphasic. She was not felt to have significant arterial disease. 12/23; patient readmitted the clinic last week. She has predominantly venous insufficiency wounds on the right medial lower leg. She also had open areas on the fingers of both hands which happened in a very bizarre way. She has almost complete healing on the right medial calf. The area on the left third finger is healed. She still has 2 open wounds on the medial aspect of the PIP of the right second finger and the dorsal aspect of the right PIP of the third finger. Of these 2 wounds the PIP of the third finger wound is painful and deeper 12/30; the areas on her right medial calf are closed. She likely has some degree of chronic venous insufficiency and lymphedema. Also very xerotic skin which I  have recommended a moisturizing. I did not bring up the issue of compression stockings The areas  we are still looking out are on the PIP of the right third finger and the medial part of the PIP of the right second finger. The second finger area is just about closed. The problem is over the PIP of the right third finger. This does not look infected and she is completing her antibiotics from last week. However this is a deep wound at the base of this there is either tendon or periosteum. She she has some healthy granulation but we are not seeing that close over the bottom part of the wound. We are probably going to need to immobilize the finger somewhat so that she does not continuously pull the tissue apart 07/17/2019; right medial calf remains closed and the right second finger is closed. The sole problem here is an area over the PIP of the third finger. These wounds were initially advertised as burn injuries that happened in a very bizarre way [please see my initial discussion on this]. I gave her doxycycline last week because of some erythema around the wound however she did not tolerate this very well because of nausea. She stopped this within the last day or 2. we have been using silver collagen 1/13; the only wound we are looking at is the right third finger over the PIP. We are using endoform. X-ray ordered last time showed no underlying bone abnormalities 1/20; right third finger over the PIP. Still debris at the surface we have been using endoform. We are making some improvements in surface area and volume but still requiring debridement. 1/27; right third finger over the PIP. Still debris on the surface of the wound and maceration. We have been using endoform. This wound initially went to the periosteum and it certainly is off that but is certainly stalled over the last 2 or 3 weeks 2/10; right third finger over the PIP. We switch to Iodoflex last time but she says this caused extreme erythema and blistering and they changed back to endoform. This is a wound that originally went to the  periosteum. This is improved since her admission but really it is stalled. She missed her appointment last week but she states she has been using endoform. She complains of increasing pain in the finger that is making it difficult for her to sleep at times 2/17; right third finger over the PIP. This is a difficult wound small punched out area. Culture I did last week showed methicillin sensitive staph aureus however she is allergic to penicillin [widespread rash as a child] she has not had cephalosporins. Multiple drug interactions with quinolones and I was concerned about trimethoprim sulfamethoxazole in somebody with ACE inhibitors and spironolactone. I prescribed doxycycline. This was 2 days ago. She comes in today without the antibiotic saying that it caused mental status changes fatigue and diarrhea the last time she was on this. 2/24; right third finger over the PIP. She is tolerating the doxycycline has another 3 days left. This is for the area on the PIP of the third finger on her right. She arrives in clinic today with a thick raised eschar over the PIP of the left third finger. I talked to her about this. The exact reason is unclear. She does smoke but denies it could be a burn injury. Her husband thinks this may be trauma pushing her wheelchair but the depth of this makes this somewhat unusual. She does not appear to  have any blood flow issues in either arm Readmission: 06/29/2020 upon evaluation today patient appears to be doing somewhat poorly in regard to her left lower extremity. She has a couple open areas here which appear to be venous in nature. One of them is a more recent trauma but nonetheless both are very superficial. She does have a lot of lymphedema/stasis type weeping from the wound opening which I think is preventing her from healing. She has been trying to work on this CYRENE, STRICK ([id number]) on her own but unfortunately just is not getting the results she needs as  far as getting the swelling under control to allow these wounds to heal. Currently the patient tells me as well that she has been undergoing treatment for breast cancer on the right. I think she has a left mastectomy as well. She also has COPD. Her husband is actually taking very good care of her and doing what he can at this point in time. Patient does have hospice coming out to see her as well. Readmission: 02/02/21 second married issues that she was having with cellulitis. She tells me currently that she was in the hospital for several days and subsequently it was during that time that she developed a wound on her left heel. Initially this was thought to just be a blister and she was told not to pop it. Subsequently upon getting home she notes that this started to worsen an opening drain. That's when she decided to contact the wound center as we've seen her previously and taking care of ulcerations for her. Upon evaluation today the patient actually showed signs of what appears to be a pressure injury stage III to the left heel. There does not appear to be any significant depth although there's just one spot that goes down to the subcutaneous layer were there some Slough noted there is a lot of blistered skin and callous tissue that is going to need to be removed help this to heal properly. The patient voiced understanding. Otherwise her medical history per above really has not changed intricately at this point. The patient was started on Doxy cycling two days ago 02/08/2021 upon evaluation today patient's wound actually showing signs of excellent improvement. I do not see any evidence of infection which is great news and after the debridement this actually seems to have done great over the past week. I am extremely pleased with what I see today. Electronic Signature(s) Signed: 02/08/2021 11:50:02 AM By: Worthy Keeler PA-C Entered By: Worthy Keeler on 02/08/2021 11:50:02 [name], [name]  ([id number]) -------------------------------------------------------------------------------- Physical Exam Details Patient Name: [name] Date of Service: 02/08/2021 11:15 AM Medical Record Number: [id number] Patient Account Number: [id number] Date of Birth/Sex: 06-06-1971 (50 y.o. F) Treating RN: Cornell Barman Primary Care Provider: Alma Friendly Other Clinician: Referring Provider: Alma Friendly Treating Provider/Extender: Skipper Cliche in Treatment: 0 Constitutional Well-nourished and well-hydrated in no acute distress. Respiratory normal breathing without difficulty. Psychiatric this patient is able to make decisions and demonstrates good insight into disease process. Alert and Oriented x 3. pleasant and cooperative. Notes Patient's wound bed showed signs of good granulation and epithelization at this point. There does not appear to be any evidence of active infection which is great and overall I am extremely pleased with where things stand today. No fevers, chills, nausea, vomiting, or diarrhea. Electronic Signature(s) Signed: 02/08/2021 11:50:16 AM By: Worthy Keeler PA-C Entered By: Worthy Keeler on 02/08/2021 11:50:16 [name], [name] ([id number]) -------------------------------------------------------------------------------- Physician Orders Details  Patient Name: KAMBERLYN, LECHLER Date of Service: 02/08/2021 11:15 AM Medical Record Number: [id number] Patient Account Number: [id number] Date of Birth/Sex: [birth year] (50 y.o. 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 Bathing/ Shower/ Hygiene o May shower; gently cleanse wound with antibacterial soap, rinse and pat dry prior to dressing wounds Wound Treatment Wound #9 - Calcaneus Wound Laterality: Left Secured With: Coban Cohesive Bandage 4x5 (yds) Stretched (DME) (Generic) 1 x Per Day/30 Days Discharge Instructions: Apply Coban as directed. Electronic Signature(s) Signed: 02/08/2021 4:30:46 PM By: Worthy Keeler PA-C Signed: 02/10/2021 5:43:31 PM By: Gretta Cool BSN, RN, CWS, Kim RN, BSN Entered By: Gretta Cool, BSN, RN, CWS, Kim on 02/08/2021 11:33:23 NEBULA, PORTEN ([id number]) -------------------------------------------------------------------------------- Problem List Details Patient Name: [name] Date of Service: 02/08/2021 11:15 AM Medical Record Number: [id number] Patient Account Number: [id number] Date of Birth/Sex: April 17, 1971 (50 y.o. 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 F) Treating RN: Cornell Barman Primary Care Provider: Alma Friendly Other Clinician: Referring Provider: Alma Friendly Treating Provider/Extender: Skipper Cliche in Treatment: 0 Subjective Chief Complaint Information obtained from Patient Left Heel Ulcer History of Present Illness (HPI) ADMISSION 06/19/2019 Patient is a 50 year old woman who is accompanied by her husband. She has severe diabetic peripheral neuropathy which is left her minimally ambulatory. She spends most of the time in a wheelchair. She also has a history of chronic lower extremity edema history of left leg DVT. She states that on 2 separate occasions roughly a month ago she fell with lacerations on the bilateral anterior tibial areas. She was seen by primary care on 06/04/2019 diagnosed with cellulitis of the left leg put on Bactrim DS. Also noted to have hand wounds with a question of referral to hand surgery. She was also sent to our clinic at the same time. The patient's husband is doing the dressings. He has been using silver alginate that was supplied by home health. He is done a really good job the area on the left leg is actually healed only a small superficial area on the right Somewhere in the in the last 2 weeks she gives a bizarre history of going to wash nail polish off the fingers of both hands. She apparently did not realize that her son had put Clorox in the vinegar jar that they used to clean the  countertop. She ended up burning her hands. The patient has a wound on the PIP of the left third finger, PIP of the right second finger and the lateral part of the right third finger Past medical history includes COPD, obstructive sleep apnea on nocturnal O2, type 2 diabetes with peripheral neuropathy, retinopathy, chronic diastolic heart failure coronary artery disease, severe diabetic neuropathy  type 2 diabetes apparently diagnosed at age 46 on insulin, COPD, continued tobacco abuse, left leg deep DVT, hypertension, TIAs, seizure disorders, asthma The patient is actually had arterial studies in February of this year. ABIs bilaterally were 1.02 and TBI's bilaterally at 0.6. Waveforms were triphasic and biphasic. She was not felt to have significant arterial disease. 12/23; patient readmitted the clinic last week. She has predominantly venous insufficiency wounds on the right medial lower leg. She also had open areas on the fingers of both hands which happened in a very bizarre way. She has almost complete healing on the right medial calf. The area on the left third finger is healed. She still has 2 open wounds on the medial aspect of the PIP of the right second finger and the dorsal aspect of the right PIP of the third finger. Of these 2 wounds the PIP of the third finger wound is painful and deeper 12/30; the areas on her right medial calf are closed. She likely has some degree of chronic venous insufficiency and lymphedema. Also very xerotic skin which I have recommended a moisturizing. I did not bring up the issue of compression stockings The areas we are still looking out are on the PIP of the right third finger and the medial part of the PIP of the right second finger. The second finger area is just about closed. The problem is over the PIP of the right third finger. This does not look infected and she is completing her antibiotics from last week. However this is a deep wound at the base of this there is either tendon or periosteum. She she has some healthy granulation but we are not seeing that close over the bottom part of the wound. We are probably going to need to immobilize the finger somewhat so that she does not continuously pull the tissue apart 07/17/2019; right medial calf remains closed and the right second finger is closed. The sole problem here is an area over the PIP of the  third finger. These wounds were initially advertised as burn injuries that happened in a very bizarre way [please see my initial discussion on this]. I gave her doxycycline last week because of some erythema around the wound however she did not tolerate this very well because of nausea. She stopped this within the last day or 2. we have been using silver collagen 1/13; the only wound we are looking at is the right third finger over the PIP. We are using endoform. X-ray ordered last time showed no underlying bone abnormalities 1/20; right third finger over the PIP. Still debris at the surface we have been using endoform. We are making some improvements in surface area and volume but still requiring debridement. 1/27; right third finger over the PIP. Still debris on the surface of the wound and maceration. We have been using endoform. This wound initially went to the periosteum and it certainly is off that but is certainly stalled over the last 2 or 3 weeks 2/10; right third finger over the PIP. We switch to Iodoflex last time but she says this caused extreme erythema and blistering  and they changed back to endoform. This is a wound that originally went to the periosteum. This is improved since her admission but really it is stalled. She missed her appointment last week but she states she has been using endoform. She complains of increasing pain in the finger that is making it difficult for her to sleep at times 2/17; right third finger over the PIP. This is a difficult wound small punched out area. Culture I did last week showed methicillin sensitive staph aureus however she is allergic to penicillin [widespread rash as a child] she has not had cephalosporins. Multiple drug interactions with quinolones and I was concerned about trimethoprim sulfamethoxazole in somebody with ACE inhibitors and spironolactone. I prescribed doxycycline. This was 2 days ago. She comes in today without the antibiotic  saying that it caused mental status changes fatigue and diarrhea the last time she was on this. 2/24; right third finger over the PIP. She is tolerating the doxycycline has another 3 days left. This is for the area on the PIP of the third finger on her right. She arrives in clinic today with a thick raised eschar over the PIP of the left third finger. I talked to her about this. The exact reason is unclear. She does smoke but denies it could be a burn injury. Her husband thinks this may be trauma pushing her wheelchair but the depth of this makes this somewhat unusual. She does not appear to have any blood flow issues in either arm [name], [name] ([id number]) Readmission: 06/29/2020 upon evaluation today patient appears to be doing somewhat poorly in regard to her left lower extremity. She has a couple open areas here which appear to be venous in nature. One of them is a more recent trauma but nonetheless both are very superficial. She does have a lot of lymphedema/stasis type weeping from the wound opening which I think is preventing her from healing. She has been trying to work on this on her own but unfortunately just is not getting the results she needs as far as getting the swelling under control to allow these wounds to heal. Currently the patient tells me as well that she has been undergoing treatment for breast cancer on the right. I think she has a left mastectomy as well. She also has COPD. Her husband is actually taking very good care of her and doing what he can at this point in time. Patient does have hospice coming out to see her as well. Readmission: 02/02/21 second married issues that she was having with cellulitis. She tells me currently that she was in the hospital for several days and subsequently it was during that time that she developed a wound on her left heel. Initially this was thought to just be a blister and she was told not to pop it. Subsequently upon getting home she  notes that this started to worsen an opening drain. That's when she decided to contact the wound center as we've seen her previously and taking care of ulcerations for her. Upon evaluation today the patient actually showed signs of what appears to be a pressure injury stage III to the left heel. There does not appear to be any significant depth although there's just one spot that goes down to the subcutaneous layer were there some Slough noted there is a lot of blistered skin and callous tissue that is going to need to be removed help this to heal properly. The patient voiced understanding. Otherwise her medical history per  above really has not changed intricately at this point. The patient was started on Doxy cycling two days ago 02/08/2021 upon evaluation today patient's wound actually showing signs of excellent improvement. I do not see any evidence of infection which is great news and after the debridement this actually seems to have done great over the past week. I am extremely pleased with what I see today. Objective Constitutional Well-nourished and well-hydrated in no acute distress. Vitals Time Taken: 11:20 AM, Height: 64 in, Weight: 242 lbs, BMI: 41.5, Temperature: 98.4 F, Pulse: 75 bpm, Respiratory Rate: 18 breaths/min, Blood Pressure: 162/88 mmHg. Respiratory normal breathing without difficulty. Psychiatric this patient is able to make decisions and demonstrates good insight into disease process. Alert and Oriented x 3. pleasant and cooperative. General Notes: Patient's wound bed showed signs of good granulation and epithelization at this point. There does not appear to be any evidence of active infection which is great and overall I am extremely pleased with where things stand today. No fevers, chills, nausea, vomiting, or diarrhea. Integumentary (Hair, Skin) Wound #9 status is Open. Original cause of wound was Blister. The date acquired was: 01/13/2021. The wound is located on the  Left Calcaneus. The wound measures 3.2cm length x 3cm width x 0.1cm depth; 7.54cm^2 area and 0.754cm^3 volume. There is Fat Layer (Subcutaneous Tissue) exposed. There is no tunneling or undermining noted. There is a medium amount of serosanguineous drainage noted. The wound margin is flat and intact. There is large (67-100%) pink granulation within the wound bed. There is a small (1-33%) amount of necrotic tissue within the wound bed. 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 Bathing/ Shower/ Hygiene: May shower; gently cleanse wound with antibacterial soap, rinse and pat dry prior to dressing wounds WOUND #9: - Calcaneus Wound Laterality: Left Secured With: Coban Cohesive Bandage 4x5 (yds) Stretched (DME) (Generic) 1 x Per Day/30 Days Discharge Instructions: Apply Coban as directed. 1. Would recommend that we going continue with the wound care measures as before with regard to the Xeroform I think this is still a good option. 2. I am also can recommend that we continue with the ABD pad followed by roll gauze to secure in place. That seems to be doing great as well. We will see patient back for reevaluation in 1 week here in the clinic. If anything worsens or changes patient will contact our office for additional recommendations. Electronic Signature(s) Signed: 02/08/2021 11:51:03 AM By: Worthy Keeler PA-C Entered By: Worthy Keeler on 02/08/2021 11:51:03 [name], [name] -------------------------------------------------------------------------------- SuperBill Details Patient Name: [name] Date of Service: 02/08/2021 Medical Record Number: [id number] Patient Account  Number: [id number] Date of Birth/Sex: 02/18/1971 (50 y.o. 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 J44.9 Chronic obstructive pulmonary disease, unspecified Facility Procedures CPT4 Code: [id number] Description: 99213 - WOUND CARE VISIT-LEV 3 EST PT Modifier: Quantity: 1 Physician Procedures CPT4 Code: [id number] Description: [id number] - WC PHYS LEVEL 4 - EST PT Modifier: Quantity: 1  CPT4 Code: Description: ICD-10 Diagnosis Description L89.623 Pressure ulcer of left heel, stage 3 L03.116 Cellulitis of left lower limb I87.2 Venous insufficiency (chronic) (peripheral) 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

## 2021-02-09 ENCOUNTER — Encounter: Payer: Self-pay | Admitting: Internal Medicine

## 2021-02-09 ENCOUNTER — Ambulatory Visit: Payer: Medicare Other | Admitting: Physician Assistant

## 2021-02-09 LAB — C. DIFFICILE GDH AND TOXIN A/B
GDH ANTIGEN: NOT DETECTED
MICRO NUMBER:: 12185997
SPECIMEN QUALITY:: ADEQUATE
TOXIN A AND B: NOT DETECTED

## 2021-02-10 DIAGNOSIS — L89623 Pressure ulcer of left heel, stage 3: Secondary | ICD-10-CM | POA: Diagnosis not present

## 2021-02-11 NOTE — Progress Notes (Signed)
[name], [name] Visit Report for 02/08/2021 Arrival Information Details Patient Name: [name], [name] Date of Service: 02/08/2021 11:15 AM Medical Record Number: [id number] Patient Account Number: [id number] Date of Birth/Sex: [birth year] (50 y.o. F) Treating RN: Cornell Barman Primary Care Nobuko Gsell: Alma Friendly Other Clinician: Referring Tramya Schoenfelder: Alma Friendly Treating Zyron Deeley/Extender: Suella Grove in Treatment: 0 Visit Information History Since Last Visit Pain Present Now: Yes Patient Arrived: Wheel Chair Arrival Time: 11:12 Accompanied By: husband Transfer Assistance: Manual Patient Identification Verified: Yes Secondary Verification Process Completed: Yes Patient Has Alerts: Yes Patient Alerts: 325 aspirin Type II Diabetic Electronic Signature(s) Signed: 02/10/2021 5:43:31 PM By: Gretta Cool, BSN, RN, CWS, Kim RN, BSN Entered By: Gretta Cool, BSN, RN, CWS, Kim on 02/08/2021 11:20:16 [name] -------------------------------------------------------------------------------- Clinic Level of Care Assessment Details Patient Name: [name] Date of Service: 02/08/2021 11:15 AM Medical Record Number: [id number] Patient Account Number: [id number] Date of Birth/Sex: [birth year] (50 y.o. F) Treating RN: Cornell Barman Primary Care Kynzie Polgar: Alma Friendly Other Clinician: Referring Kylii Ennis: Alma Friendly Treating Peggy Monk/Extender: Skipper Cliche in Treatment: 0 Clinic Level of Care Assessment Items TOOL 4 Quantity Score '[]'$  - Use when only an EandM is performed on FOLLOW-UP visit 0 ASSESSMENTS - Nursing Assessment / Reassessment X - Reassessment of Co-morbidities (includes updates in patient status) 1 10 X- 1 5 Reassessment of Adherence to Treatment Plan ASSESSMENTS - Wound and Skin Assessment / Reassessment X - Simple Wound Assessment / Reassessment - one wound 1 5 '[]'$  - 0 Complex Wound Assessment / Reassessment - multiple wounds '[]'$  - 0 Dermatologic / Skin  Assessment (not related to wound area) ASSESSMENTS - Focused Assessment '[]'$  - Circumferential Edema Measurements - multi extremities 0 '[]'$  - 0 Nutritional Assessment / Counseling / Intervention '[]'$  - 0 Lower Extremity Assessment (monofilament, tuning fork, pulses) '[]'$  - 0 Peripheral Arterial Disease Assessment (using hand held doppler) ASSESSMENTS - Ostomy and/or Continence Assessment and Care '[]'$  - Incontinence Assessment and Management 0 '[]'$  - 0 Ostomy Care Assessment and Management (repouching, etc.) PROCESS - Coordination of Care X - Simple Patient / Family Education for ongoing care 1 15 '[]'$  - 0 Complex (extensive) Patient / Family Education for ongoing care '[]'$  - 0 Staff obtains Programmer, systems, Records, Test Results / Process Orders '[]'$  - 0 Staff telephones HHA, Nursing Homes / Clarify orders / etc '[]'$  - 0 Routine Transfer to another Facility (non-emergent condition) '[]'$  - 0 Routine Hospital Admission (non-emergent condition) '[]'$  - 0 New Admissions / Biomedical engineer / Ordering NPWT, Apligraf, etc. '[]'$  - 0 Emergency Hospital Admission (emergent condition) X- 1 10 Simple Discharge Coordination '[]'$  - 0 Complex (extensive) Discharge Coordination PROCESS - Special Needs '[]'$  - Pediatric / Minor Patient Management 0 '[]'$  - 0 Isolation Patient Management '[]'$  - 0 Hearing / Language / Visual special needs '[]'$  - 0 Assessment of Community assistance (transportation, D/C planning, etc.) '[]'$  - 0 Additional assistance / Altered mentation '[]'$  - 0 Support Surface(s) Assessment (bed, cushion, seat, etc.) INTERVENTIONS - Wound Cleansing / Measurement [name], [name] X- 1 5 Simple Wound Cleansing - one wound '[]'$  - 0 Complex Wound Cleansing - multiple wounds X- 1 5 Wound Imaging (photographs - any number of wounds) '[]'$  - 0 Wound Tracing (instead of photographs) X- 1 5 Simple Wound Measurement - one wound '[]'$  - 0 Complex Wound Measurement - multiple wounds INTERVENTIONS - Wound  Dressings '[]'$  - Small Wound Dressing one or multiple wounds 0 X- 1 15 Medium Wound Dressing one or multiple wounds '[]'$  - 0 Large Wound  Dressing one or multiple wounds '[]'$  - 0 Application of Medications - topical '[]'$  - 0 Application of Medications - injection INTERVENTIONS - Miscellaneous '[]'$  - External ear exam 0 '[]'$  - 0 Specimen Collection (cultures, biopsies, blood, body fluids, etc.) '[]'$  - 0 Specimen(s) / Culture(s) sent or taken to Lab for analysis '[]'$  - 0 Patient Transfer (multiple staff / Civil Service fast streamer / Similar devices) '[]'$  - 0 Simple Staple / Suture removal (25 or less) '[]'$  - 0 Complex Staple / Suture removal (26 or more) '[]'$  - 0 Hypo / Hyperglycemic Management (close monitor of Blood Glucose) '[]'$  - 0 Ankle / Brachial Index (ABI) - do not check if billed separately X- 1 5 Vital Signs Has the patient been seen at the hospital within the last three years: Yes Total Score: 80 Level Of Care: New/Established - Level 3 Electronic Signature(s) Signed: 02/10/2021 5:43:31 PM By: Gretta Cool, BSN, RN, CWS, Kim RN, BSN Entered By: Gretta Cool, BSN, RN, CWS, Kim on 02/08/2021 11:35:48 [name] -------------------------------------------------------------------------------- Encounter Discharge Information Details Patient Name: [name] Date of Service: 02/08/2021 11:15 AM Medical Record Number: [id number] Patient Account Number: [id number] Date of Birth/Sex: [birth year] (50 y.o. F) Treating RN: Cornell Barman Primary Care Sabatino Williard: Alma Friendly Other Clinician: Referring Eudell Mcphee: Alma Friendly Treating Mellisa Arshad/Extender: Skipper Cliche in Treatment: 0 Encounter Discharge Information Items Discharge Condition: Stable Ambulatory Status: Walker Discharge Destination: Home Transportation: Private Auto Accompanied By: self Schedule Follow-up Appointment: Yes Clinical Summary of Care: Electronic Signature(s) Signed: 02/10/2021 5:43:31 PM By: Gretta Cool, BSN, RN, CWS, Kim RN,  BSN Entered By: Gretta Cool, BSN, RN, CWS, Kim on 02/08/2021 11:38:22 [name] -------------------------------------------------------------------------------- Lower Extremity Assessment Details Patient Name: [name] Date of Service: 02/08/2021 11:15 AM Medical Record Number: [id number] Patient Account Number: [id number] Date of Birth/Sex: 10-15-1970 (50 y.o. F) Treating RN: Cornell Barman Primary Care Maylon Sailors: Alma Friendly Other Clinician: Referring Sylvie Mifsud: Alma Friendly Treating Tyleah Loh/Extender: Skipper Cliche in Treatment: 0 Edema Assessment Assessed: [Left: Yes] [Right: No] Edema: [Left: Ye] [Right: s] Calf Left: Right: Point of Measurement: 30 cm From Medial Instep 46.8 cm Ankle Left: Right: Point of Measurement: 10 cm From Medial Instep 26 cm Vascular Assessment Pulses: Dorsalis Pedis Palpable: [Left:Yes] Posterior Tibial Palpable: [Left:Yes] Electronic Signature(s) Signed: 02/10/2021 5:43:31 PM By: Gretta Cool, BSN, RN, CWS, Kim RN, BSN Entered By: Gretta Cool, BSN, RN, CWS, Kim on 02/08/2021 11:25:54 [name], [name] -------------------------------------------------------------------------------- Multi Wound Chart Details Patient Name: [name] Date of Service: 02/08/2021 11:15 AM Medical Record Number: [id number] Patient Account Number: [id number] Date of Birth/Sex: May 09, 1971 (50 y.o. F) Treating RN: Cornell Barman Primary Care Elijah Michaelis: Alma Friendly Other Clinician: Referring Lorraine Terriquez: Alma Friendly Treating Araceli Coufal/Extender: Skipper Cliche in Treatment: 0 Vital Signs Height(in): 64 Pulse(bpm): 76 Weight(lbs): 242 Blood Pressure(mmHg): 162/88 Body Mass Index(BMI): 42 Temperature(F): 98.4 Respiratory Rate(breaths/min): 18 Photos: [N/A:N/A] Wound Location: Left Calcaneus N/A N/A Wounding Event: Blister N/A N/A Primary Etiology: Pressure Ulcer N/A N/A Comorbid History: Asthma, Chronic Obstructive N/A N/A Pulmonary  Disease (COPD), Sleep Apnea, Arrhythmia, Congestive Heart Failure, Coronary Artery Disease, Hypertension, Type II Diabetes, Neuropathy, Received Chemotherapy, Received Radiation Date Acquired: 01/13/2021 N/A N/A Weeks of Treatment: 0 N/A N/A Wound Status: Open N/A N/A Measurements L x W x D (cm) 3.2x3x0.1 N/A N/A Area (cm) : 7.54 N/A N/A Volume (cm) : 0.754 N/A N/A % Reduction in Area: 20.00% N/A N/A % Reduction in Volume: 20.00% N/A N/A Classification: Category/Stage III N/A N/A Exudate Amount: Medium N/A N/A Exudate Type: Serosanguineous N/A N/A Exudate Color: red,  brown N/A N/A Wound Margin: Flat and Intact N/A N/A Granulation Amount: Large (67-100%) N/A N/A Granulation Quality: Pink N/A N/A Necrotic Amount: Small (1-33%) N/A N/A Exposed Structures: Fat Layer (Subcutaneous Tissue): N/A N/A Yes Fascia: No Tendon: No Muscle: No Joint: No Bone: No Epithelialization: Medium (34-66%) N/A N/A Treatment Notes Electronic Signature(s) Signed: 02/10/2021 5:43:31 PM By: Gretta Cool, BSN, RN, CWS, Kim RN, BSN Entered By: Gretta Cool, BSN, RN, CWS, Kim on 02/08/2021 11:26:16 [name], [name] [name], [name] -------------------------------------------------------------------------------- Multi-Disciplinary Care Plan Details Patient Name: [name] Date of Service: 02/08/2021 11:15 AM Medical Record Number: [id number] Patient Account Number: [id number] Date of Birth/Sex: Oct 18, 1970 (50 y.o. F) Treating RN: Cornell Barman Primary Care [place]: Alma Friendly Other Clinician: Referring Alveena Taira: Alma Friendly Treating Kymoni Lesperance/Extender: Skipper Cliche in Treatment: 0 Active Inactive Wound/Skin Impairment Nursing Diagnoses: Impaired tissue integrity Goals: Patient/caregiver will verbalize understanding of skin care regimen Date Initiated: 02/02/2021 Target Resolution Date: 02/02/2021 Goal Status: Active Ulcer/skin breakdown will have a volume reduction of 30% by  week 4 Date Initiated: 02/02/2021 Target Resolution Date: 03/05/2021 Goal Status: Active Ulcer/skin breakdown will have a volume reduction of 50% by week 8 Date Initiated: 02/02/2021 Target Resolution Date: 04/05/2021 Goal Status: Active Ulcer/skin breakdown will have a volume reduction of 80% by week 12 Date Initiated: 02/02/2021 Target Resolution Date: 05/05/2021 Goal Status: Active Ulcer/skin breakdown will heal within 14 weeks Date Initiated: 02/02/2021 Target Resolution Date: 06/05/2021 Goal Status: Active Interventions: Assess patient/caregiver ability to obtain necessary supplies Assess patient/caregiver ability to perform ulcer/skin care regimen upon admission and as needed Assess ulceration(s) every visit Provide education on ulcer and skin care Treatment Activities: Referred to DME Aris Even for dressing supplies : 02/02/2021 Skin care regimen initiated : 02/02/2021 Notes: Electronic Signature(s) Signed: 02/10/2021 5:43:31 PM By: Gretta Cool, BSN, RN, CWS, Kim RN, BSN Entered By: Gretta Cool, BSN, RN, CWS, Kim on 02/08/2021 11:26:07 [name] -------------------------------------------------------------------------------- Pain Assessment Details Patient Name: [name] Date of Service: 02/08/2021 11:15 AM Medical Record Number: [id number] Patient Account Number: [id number] Date of Birth/Sex: Jun 24, 1971 (50 y.o. F) Treating RN: Cornell Barman Primary Care Sagan Maselli: Alma Friendly Other Clinician: Referring Dylan Monforte: Alma Friendly Treating Nashali Ditmer/Extender: Skipper Cliche in Treatment: 0 Active Problems Location of Pain Severity and Description of Pain Patient Has Paino Yes Site Locations Pain Location: Generalized Pain, Pain in Ulcers Rate the pain. Current Pain Level: 7 Pain Management and Medication Current Pain Management: Electronic Signature(s) Signed: 02/10/2021 5:43:31 PM By: Gretta Cool, BSN, RN, CWS, Kim RN, BSN Entered By: Gretta Cool, BSN, RN, CWS, Kim on  02/08/2021 11:20:51 [name] -------------------------------------------------------------------------------- Patient/Caregiver Education Details Patient Name: [name] Date of Service: 02/08/2021 11:15 AM Medical Record Number: [id number] Patient Account Number: [id number] Date of Birth/Gender: 04-17-1971 (50 y.o. F) Treating RN: Cornell Barman Primary Care Physician: Alma Friendly Other Clinician: Referring Physician: Alma Friendly Treating Physician/Extender: Skipper Cliche in Treatment: 0 Education Assessment Education Provided To: Patient and Caregiver Education Topics Provided Wound/Skin Impairment: Handouts: Caring for Your Ulcer, Other: continue wound care as prescribed Methods: Demonstration, Explain/Verbal Responses: State content correctly Electronic Signature(s) Signed: 02/10/2021 5:43:31 PM By: Gretta Cool, BSN, RN, CWS, Kim RN, BSN Entered By: Gretta Cool, BSN, RN, CWS, Kim on 02/08/2021 11:37:23 [name] -------------------------------------------------------------------------------- Wound Assessment Details Patient Name: [name] Date of Service: 02/08/2021 11:15 AM Medical Record Number: [id number] Patient Account Number: [id number] Date of Birth/Sex: May 30, 1971 (50 y.o. F) Treating RN: Cornell Barman Primary Care Collyns Mcquigg: Alma Friendly Other Clinician: Referring Ayman Brull: Alma Friendly Treating Chima Astorino/Extender: Jeri Cos  Weeks in Treatment: 0 Wound Status Wound Number: 9 Primary Pressure Ulcer Etiology: Wound Location: Left Calcaneus Wound Open Wounding Event: Blister Status: Date Acquired: 01/13/2021 Comorbid Asthma, Chronic Obstructive Pulmonary Disease (COPD), Weeks Of Treatment: 0 History: Sleep Apnea, Arrhythmia, Congestive Heart Failure, Clustered Wound: No Coronary Artery Disease, Hypertension, Type II Diabetes, Neuropathy, Received Chemotherapy, Received Radiation Photos Wound Measurements Length: (cm)  3.2 % Red Width: (cm) 3 % Red Depth: (cm) 0.1 Epith Area: (cm) 7.54 Tunn Volume: (cm) 0.754 Unde uction in Area: 20% uction in Volume: 20% elialization: Medium (34-66%) eling: No rmining: No Wound Description Classification: Category/Stage III Foul Wound Margin: Flat and Intact Slou Exudate Amount: Medium Exudate Type: Serosanguineous Exudate Color: red, brown Odor After Cleansing: No gh/Fibrino Yes Wound Bed Granulation Amount: Large (67-100%) Exposed Structure Granulation Quality: Pink Fascia Exposed: No Necrotic Amount: Small (1-33%) Fat Layer (Subcutaneous Tissue) Exposed: Yes Tendon Exposed: No Muscle Exposed: No Joint Exposed: No Bone Exposed: No Treatment Notes Wound #9 (Calcaneus) Wound Laterality: Left Cleanser Peri-Wound Care [name], [name] Topical Primary Dressing Xeroform 4x4-HBD (in/in) Discharge Instruction: Apply Xeroform 4x4-HBD (in/in) as directed Secondary Dressing Gauze Discharge Instruction: Apply dry gauze over dressing Secured With Coban Cohesive Bandage 4x5 (yds) Stretched Discharge Instruction: Apply Coban as directed. Compression Wrap Compression Stockings Add-Ons Electronic Signature(s) Signed: 02/10/2021 5:43:31 PM By: Gretta Cool, BSN, RN, CWS, Kim RN, BSN Entered By: Gretta Cool, BSN, RN, CWS, Kim on 02/08/2021 11:24:07 [name] -------------------------------------------------------------------------------- Vitals Details Patient Name: [name] Date of Service: 02/08/2021 11:15 AM Medical Record Number: [id number] Patient Account Number: [id number] Date of Birth/Sex: [birth year] (50 y.o. F) Treating RN: Cornell Barman Primary Care [place] Strege: Alma Friendly Other Clinician: Referring Steffie Waggoner: Alma Friendly Treating Carnell Beavers/Extender: Skipper Cliche in Treatment: 0 Vital Signs Time Taken: 11:20 Temperature (F): 98.4 Height (in): 64 Pulse (bpm): 75 Weight (lbs): 242 Respiratory Rate (breaths/min):  18 Body Mass Index (BMI): 41.5 Blood Pressure (mmHg): 162/88 Reference Range: 80 - 120 mg / dl Electronic Signature(s) Signed: 02/10/2021 5:43:31 PM By: Gretta Cool, BSN, RN, CWS, Kim RN, BSN Entered By: Gretta Cool, BSN, RN, CWS, Kim on 02/08/2021 11:20:37

## 2021-02-12 ENCOUNTER — Other Ambulatory Visit: Payer: Self-pay | Admitting: Primary Care

## 2021-02-12 DIAGNOSIS — G2581 Restless legs syndrome: Secondary | ICD-10-CM

## 2021-02-12 IMAGING — DX DG HAND COMPLETE 3+V*L*
3 series · 3 of 3 positions shown · non-contrast
Comparison: None.

CLINICAL DATA: contracture, left hand pain.  No trauma.

EXAM:
LEFT HAND - COMPLETE 3+ VIEW

[hand ap]
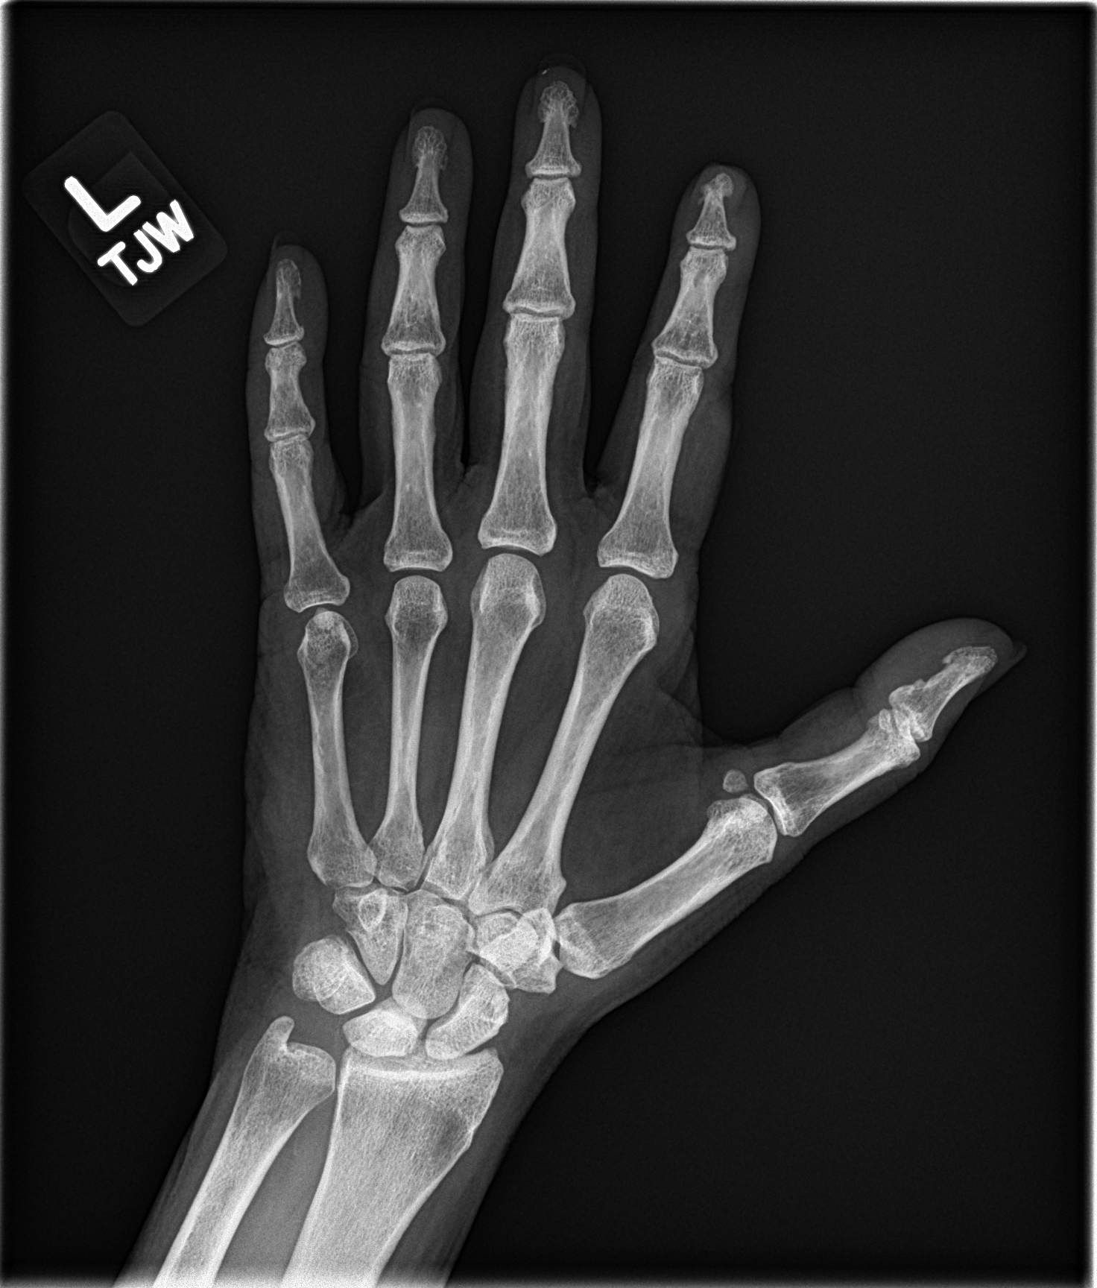

[hand obl]
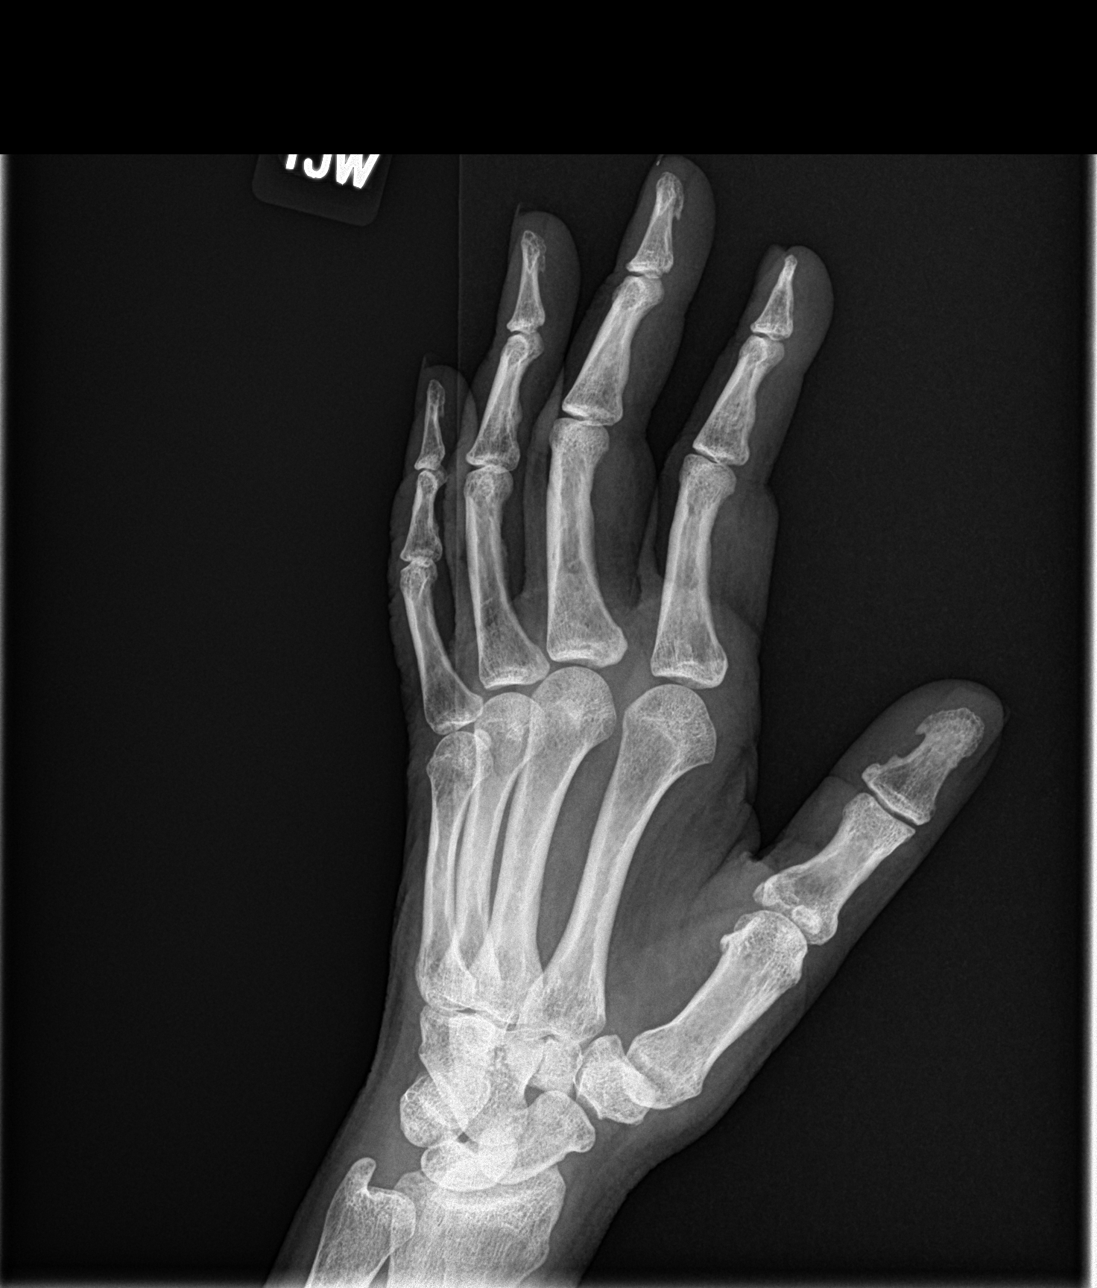

[hand lat]
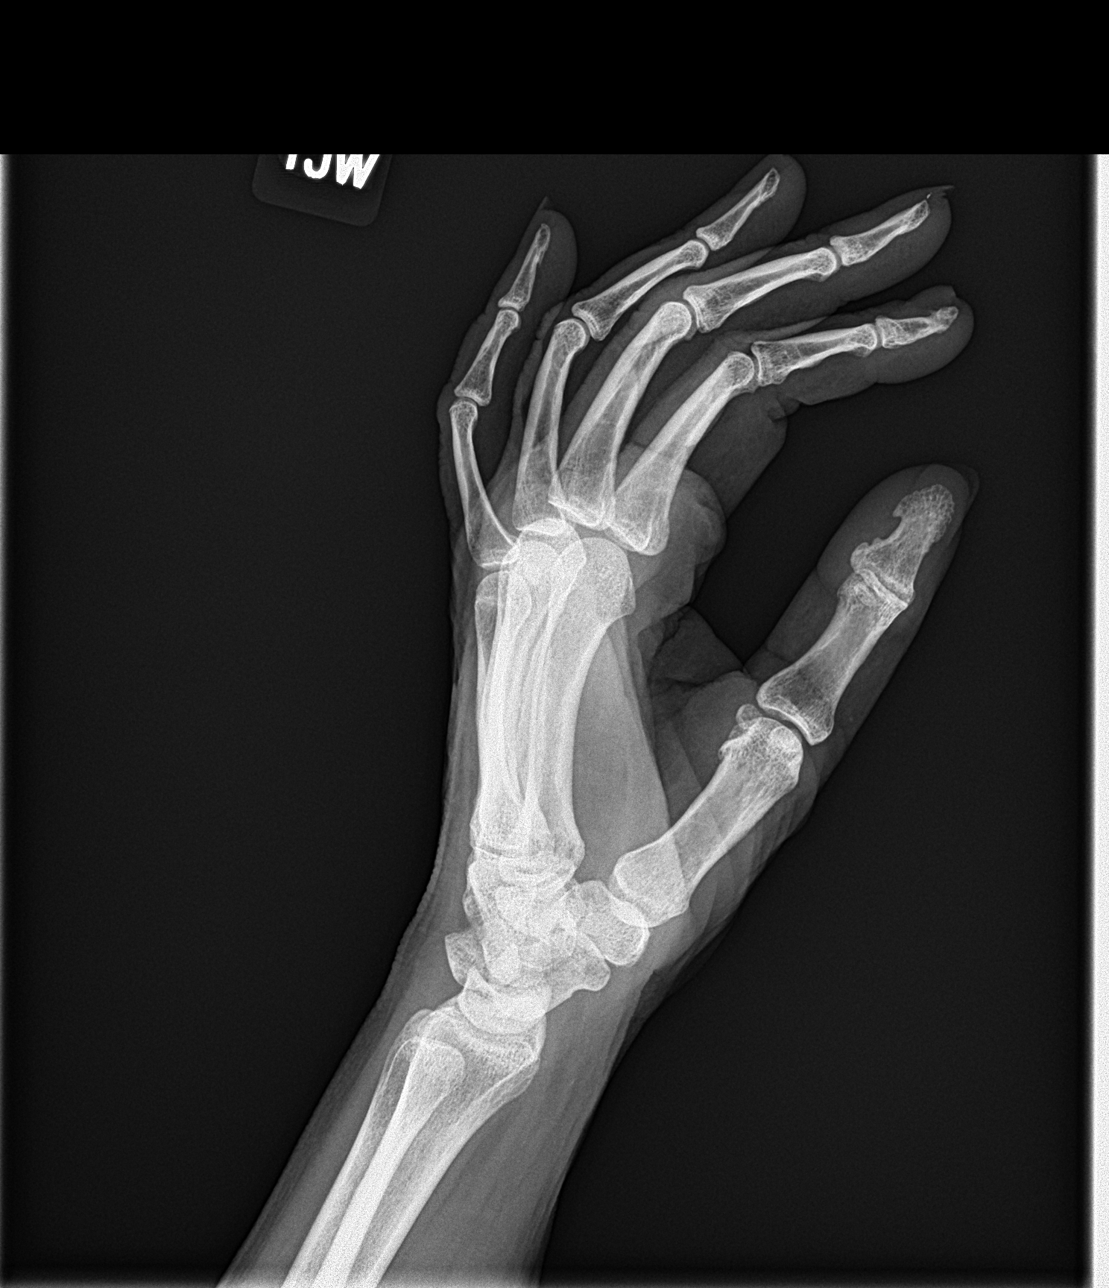

[3 of 3 positions shown; findings below may reference images not displayed]

FINDINGS: There is no evidence of fracture or dislocation. There is no
evidence of arthropathy or other focal bone abnormality. Soft
tissues are unremarkable.
IMPRESSION: No acute or focal osseous abnormality.

## 2021-02-12 IMAGING — CT CT HEAD W/O CM
3 series · 15 of 47 positions shown, 18 images · non-contrast
Comparison: CT head [DATE]

CLINICAL DATA: Acute neuro deficit. Rule [REDACTED] history
of left-sided weakness, slurred speech, blurred vision.

EXAM:
CT HEAD WITHOUT CONTRAST
TECHNIQUE: Contiguous axial images were obtained from the base of the skull
through the vertex without intravenous contrast.

[Series 2: head wo · axial · 0.45mm/px · z∈[+399,+524]mm · 9 of 30 slices shown, 12 images]
[im 3/30  brain]
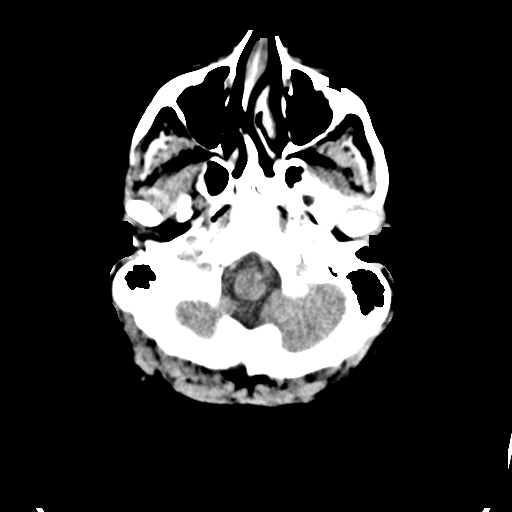
[im 3/30  bone]
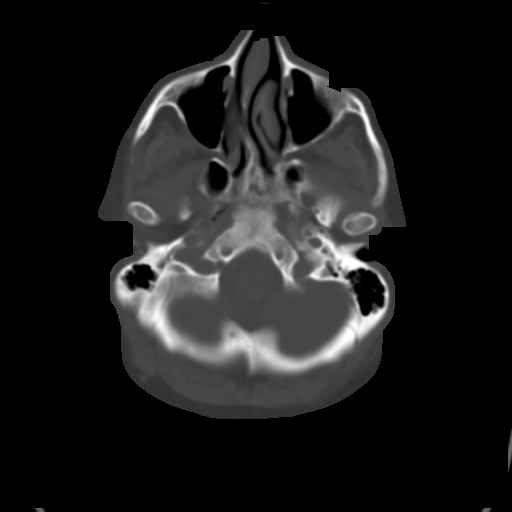
[im 6/30  brain]
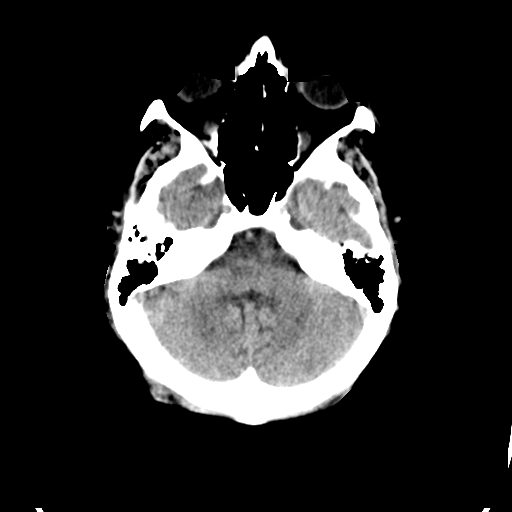
[im 9/30  brain]
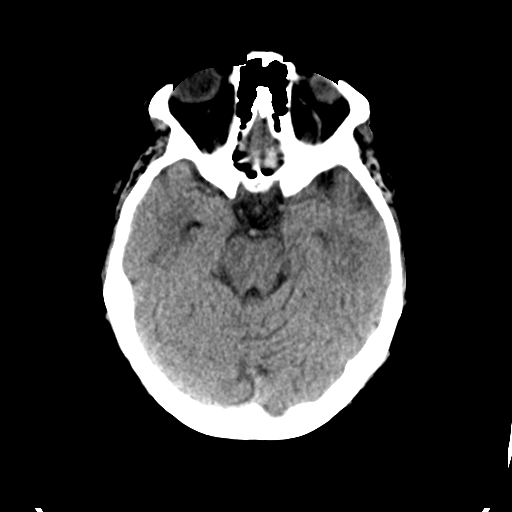
[im 12/30  brain]
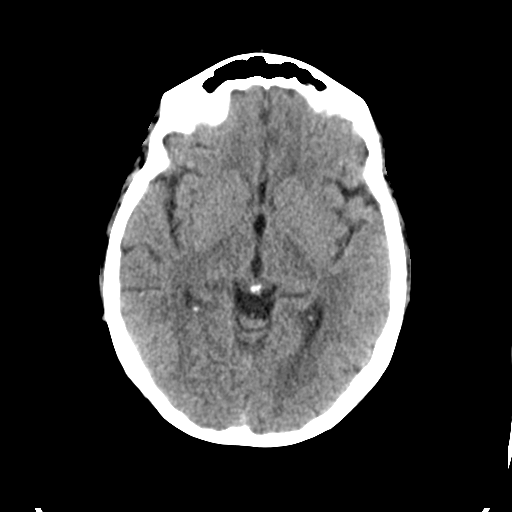
[im 16/30  brain]
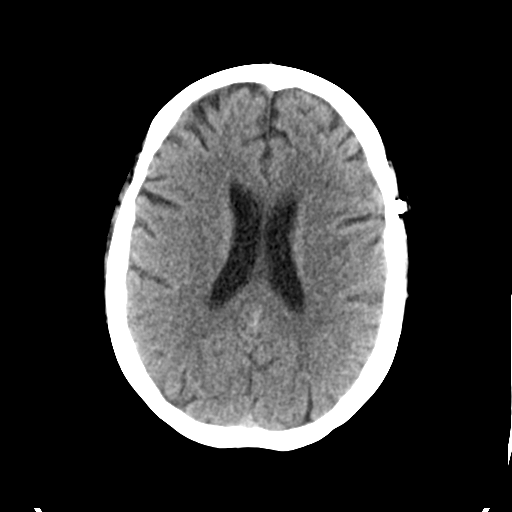
[im 16/30  bone]
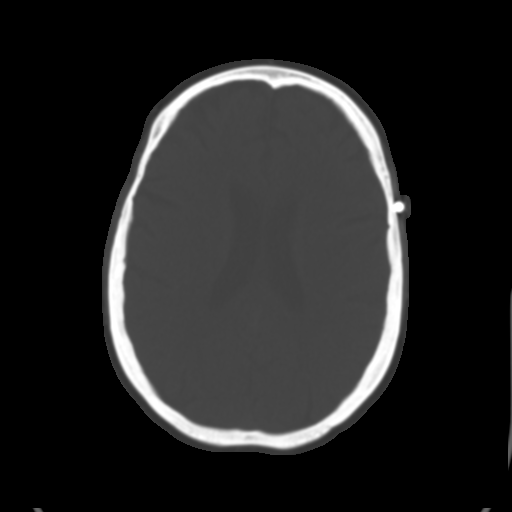
[im 19/30  brain]
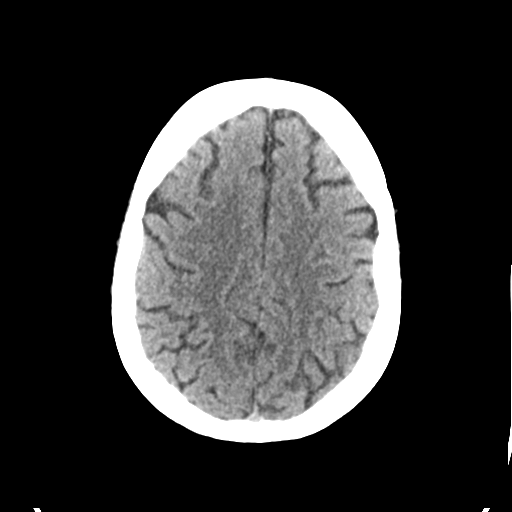
[im 22/30  brain]
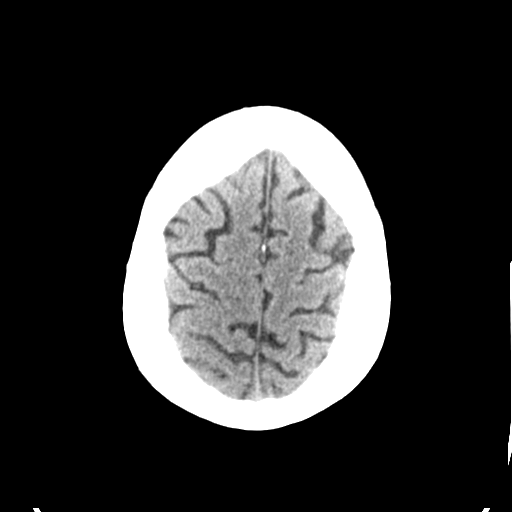
[im 25/30  brain]
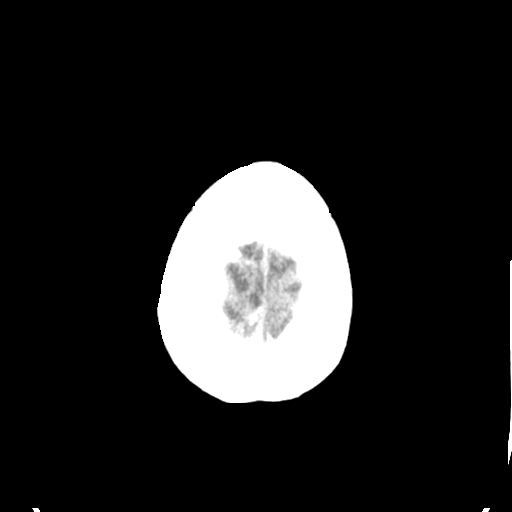
[im 28/30  brain]
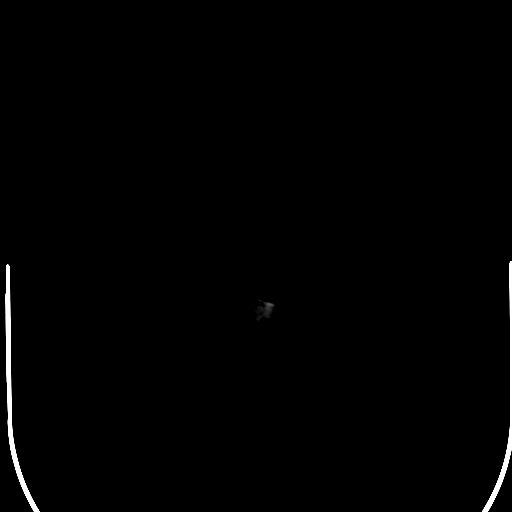
[im 28/30  bone]
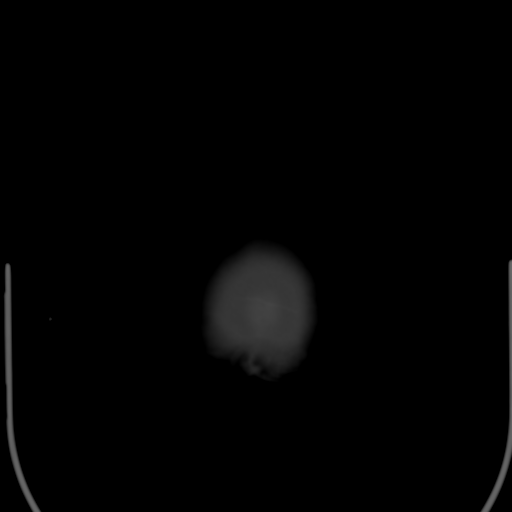

[Series 4: coronal soft tissue · coronal · 0.29mm/px · 3 of 60 slices shown]
[im 20/60  brain]
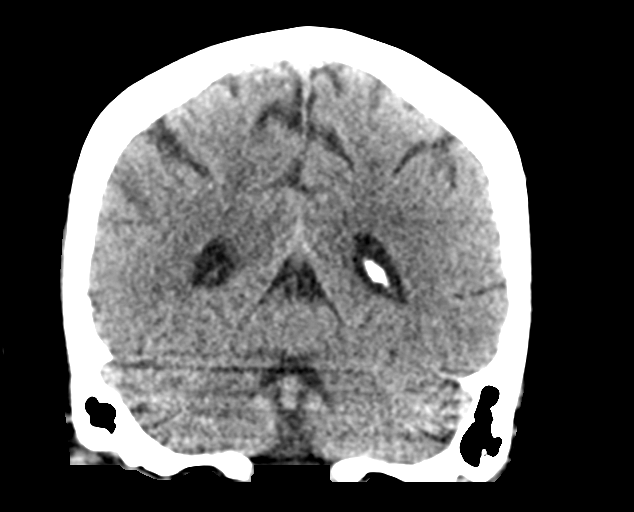
[im 27/60  brain]
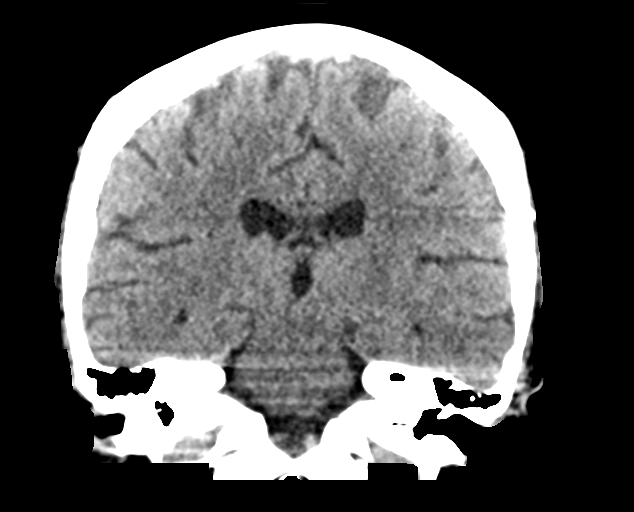
[im 33/60  brain]
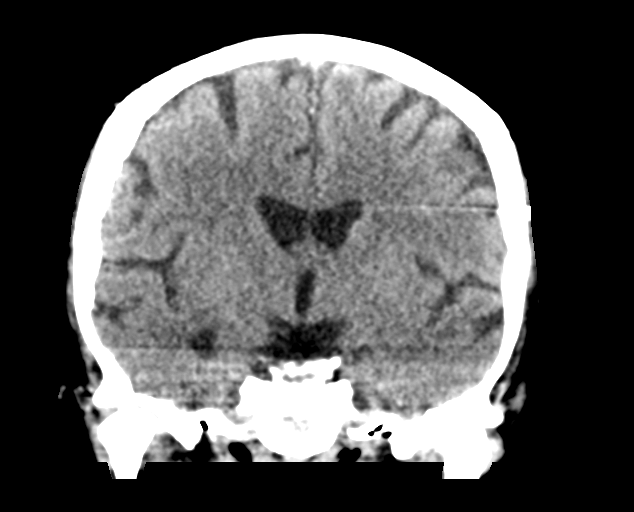

[Series 5: sagittal soft tissue · sagittal · 0.31mm/px · 3 of 50 slices shown]
[im 17/50  brain]
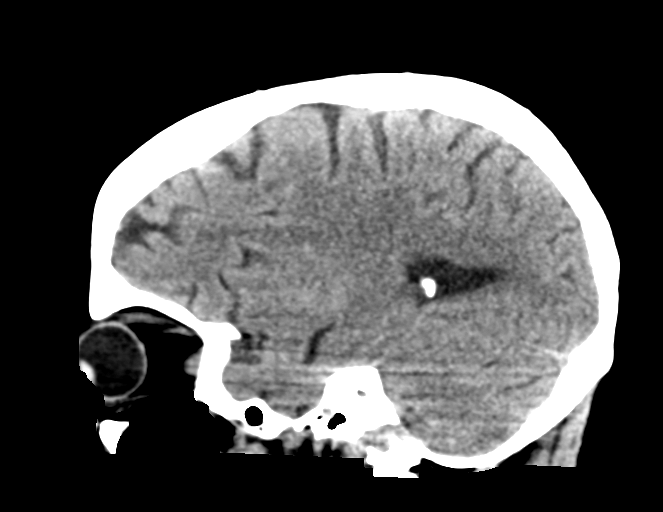
[im 25/50  brain]
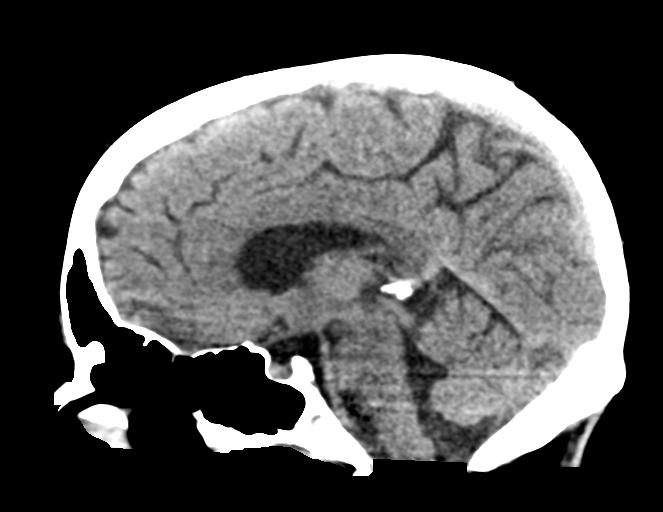
[im 33/50  brain]
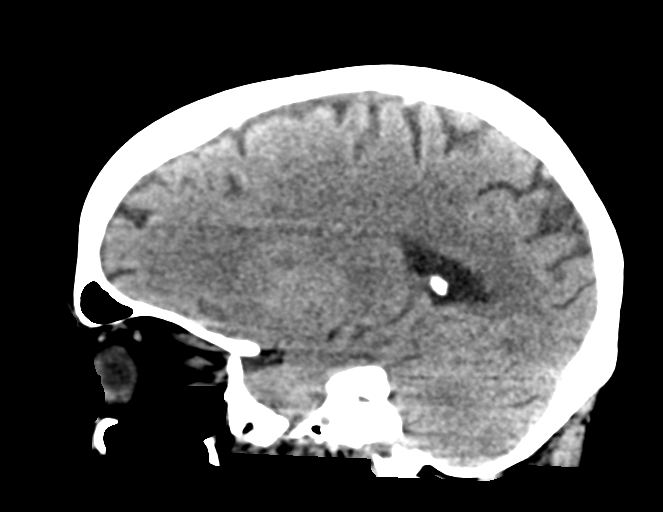

[15 of 47 positions shown; findings below may reference images not displayed]

FINDINGS: Brain: No evidence of acute infarction, hemorrhage, hydrocephalus,
extra-axial collection or mass lesion/mass effect.

Vascular: Negative for hyperdense vessel.

Skull: No focal skeletal lesion. Metal BB in the left lateral
frontal scalp unchanged from the prior study.

Sinuses/Orbits: Paranasal sinuses clear.  Normal orbit.

Other: None
IMPRESSION: Negative CT head. No acute abnormality no change from the prior
study.

## 2021-02-13 NOTE — Telephone Encounter (Signed)
Can we get her scheduled for a general follow up within the next 3 months? She will need to be scheduled as my last patient of the day in the 3 pm slot. Any day is fine. No rush. Just need a general follow up.

## 2021-02-15 ENCOUNTER — Ambulatory Visit (INDEPENDENT_AMBULATORY_CARE_PROVIDER_SITE_OTHER): Payer: Medicare Other

## 2021-02-15 DIAGNOSIS — I5032 Chronic diastolic (congestive) heart failure: Secondary | ICD-10-CM

## 2021-02-15 DIAGNOSIS — J449 Chronic obstructive pulmonary disease, unspecified: Secondary | ICD-10-CM

## 2021-02-15 DIAGNOSIS — L89629 Pressure ulcer of left heel, unspecified stage: Secondary | ICD-10-CM

## 2021-02-15 DIAGNOSIS — I152 Hypertension secondary to endocrine disorders: Secondary | ICD-10-CM

## 2021-02-15 DIAGNOSIS — E1159 Type 2 diabetes mellitus with other circulatory complications: Secondary | ICD-10-CM

## 2021-02-15 DIAGNOSIS — E1165 Type 2 diabetes mellitus with hyperglycemia: Secondary | ICD-10-CM

## 2021-02-15 IMAGING — US US ABDOMEN LIMITED
1 series · 14 of 25 positions shown · non-contrast
Comparison: Prior CT from earlier same day.

CLINICAL DATA: Initial evaluation for possible cholecystitis on
prior CT.

EXAM:
ULTRASOUND ABDOMEN LIMITED RIGHT UPPER QUADRANT

[Series 1: us abdomen limited ruq (liver/gb) · 14 of 37 slices shown]
[im 1/37]
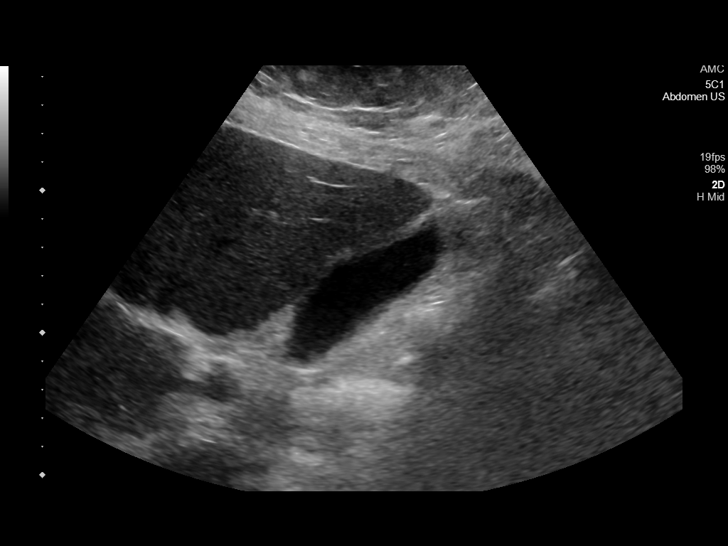
[im 4/37]
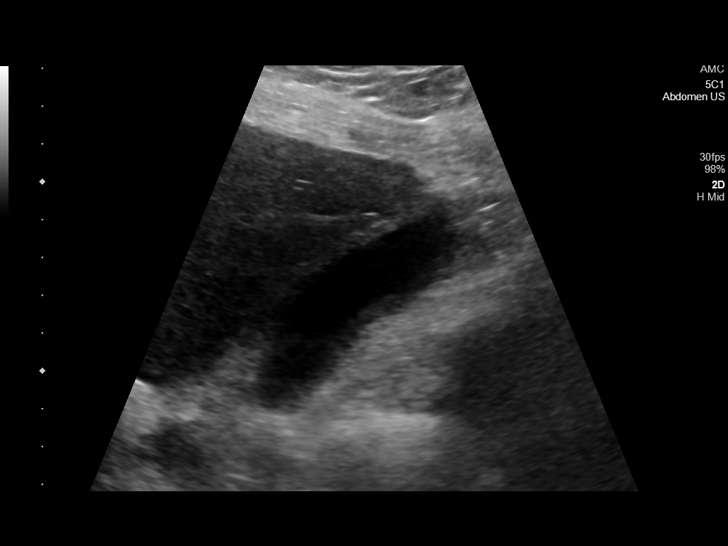
[im 7/37]
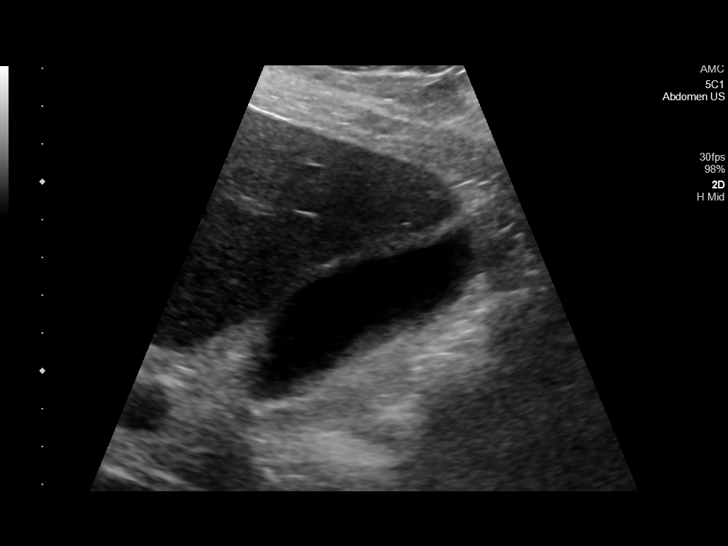
[im 10/37]
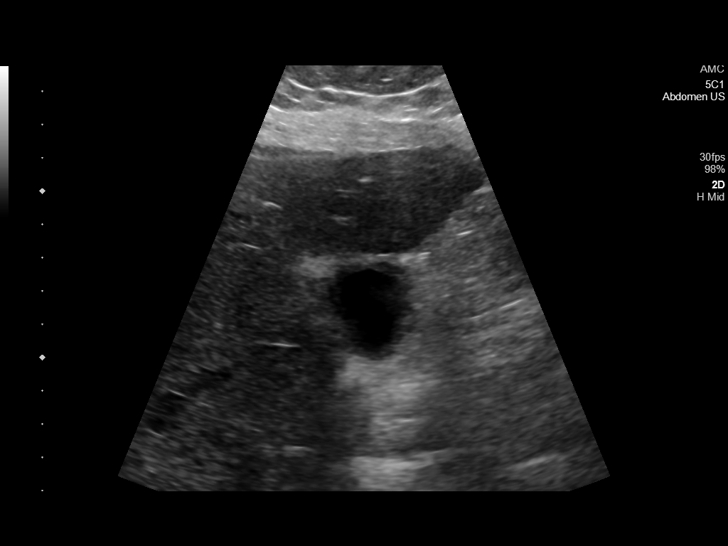
[im 13/37]
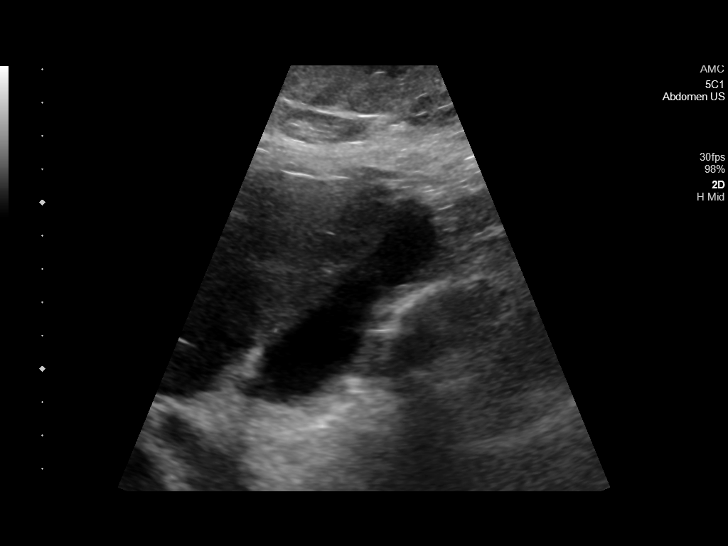
[im 14/37]
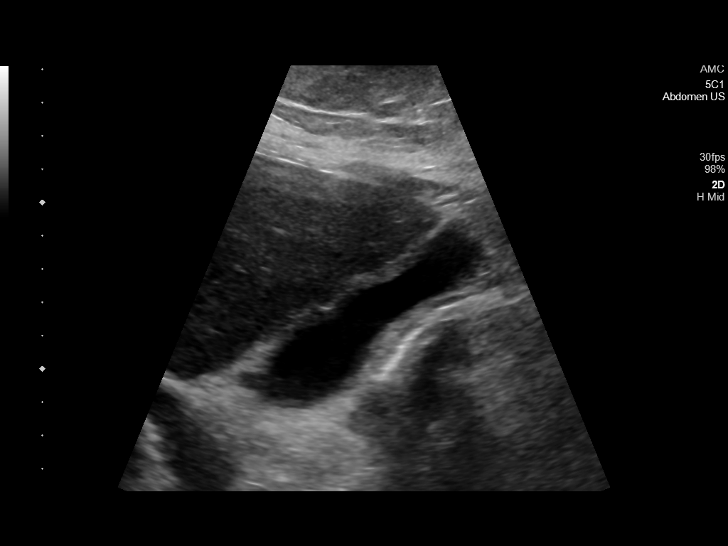
[im 17/37]
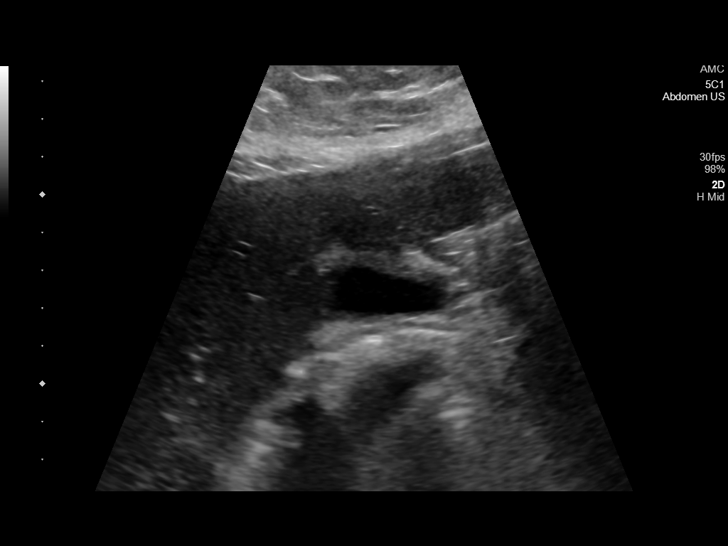
[im 20/37]
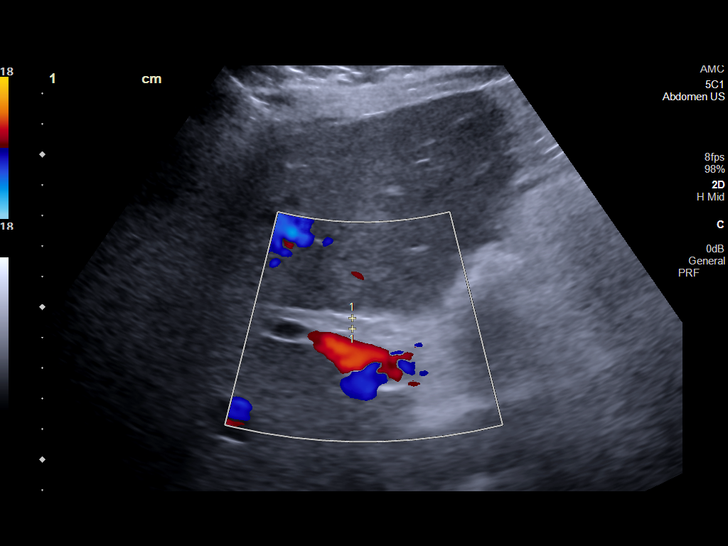
[im 23/37]
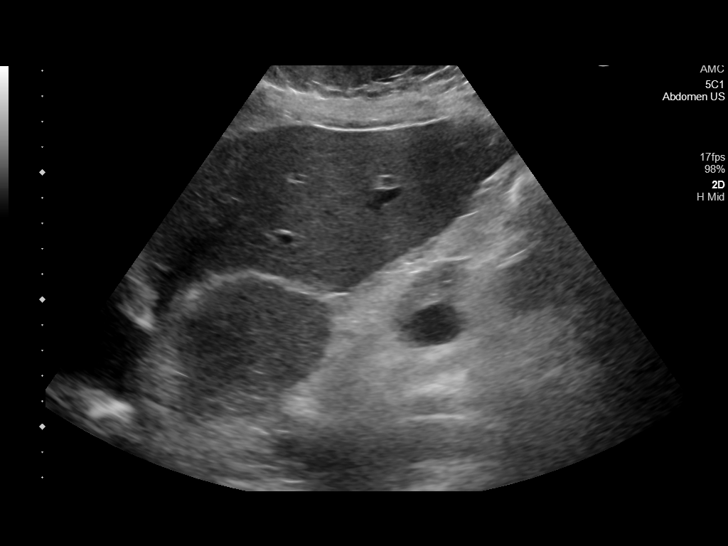
[im 25/37]
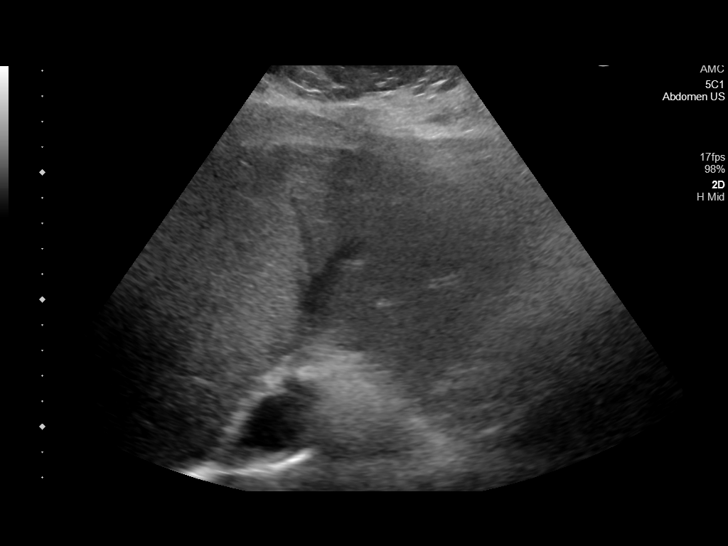
[im 28/37]
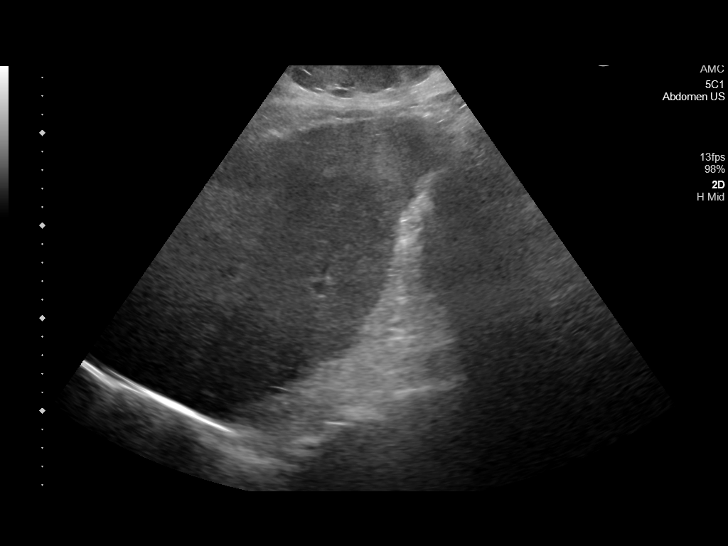
[im 31/37]
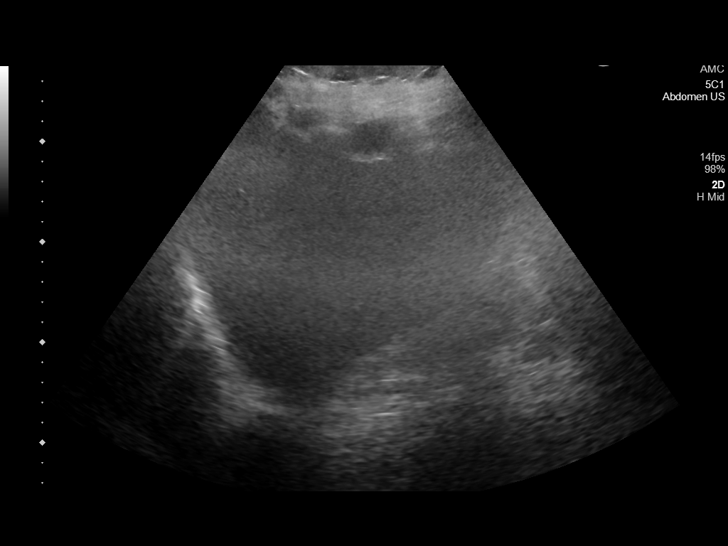
[im 34/37]
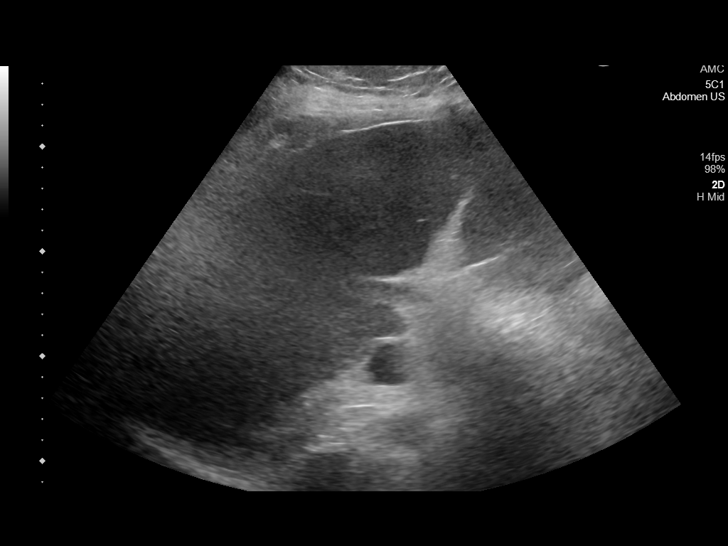
[im 37/37]
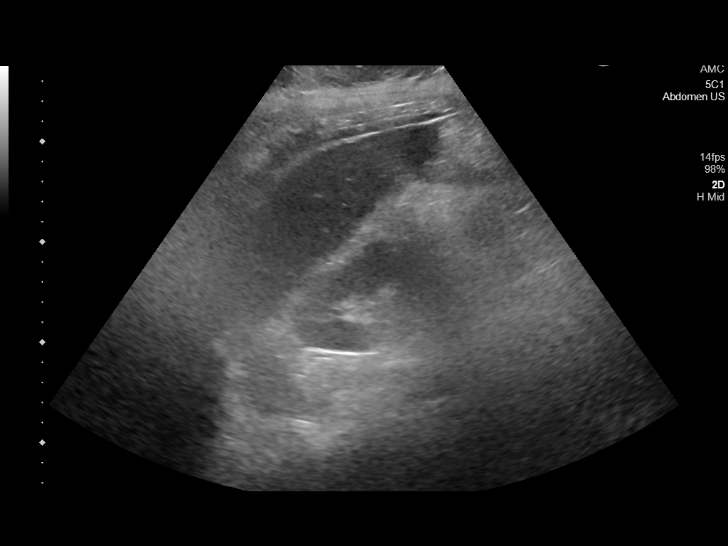

[14 of 25 positions shown; findings below may reference images not displayed]

FINDINGS: Gallbladder:

Gallbladder wall mildly thickened up to 3.7 mm. No free
pericholecystic fluid. No internal stones or sludge. No sonographic
Murphy sign elicited on exam.

Common bile duct:

Diameter: 3.5 mm

Liver:

No focal lesion identified. Within normal limits in parenchymal
echogenicity. Portal vein is patent on color Doppler imaging with
normal direction of blood flow towards the liver.

Other: None.
IMPRESSION: 1. Mild gallbladder wall thickening up to 3.7 mm, of uncertain
significance. No cholelithiasis or other findings to suggest acute
cholecystitis.
2. No biliary dilatation.

## 2021-02-15 IMAGING — CT CT ANGIO CHEST
2 of 6 series · 16 of 46 positions shown · IV contrast (APPLIED)
Comparison: CT 02/26/2020

CLINICAL DATA: Syncopal episode lasting 5 minutes which occurred
after using restroom, history of TIA on [REDACTED]

EXAM:
CT ANGIOGRAPHY CHEST WITH CONTRAST
TECHNIQUE: Multidetector CT imaging of the chest was performed using the
standard protocol during bolus administration of intravenous
contrast. Multiplanar CT image reconstructions and MIPs were
obtained to evaluate the vascular anatomy.
CONTRAST:  100mL OMNIPAQUE IOHEXOL 350 MG/ML SOLN

[Series 5: thins · axial · 0.73mm/px · z∈[-815,-522]mm · 13 of 321 slices shown]
[im 14/321  lung]
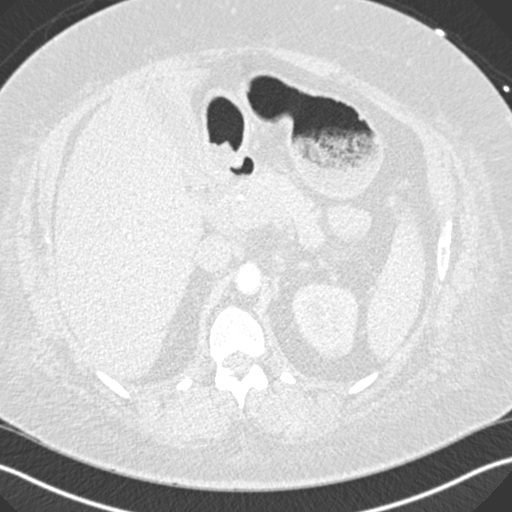
[im 42/321  soft-tissue]
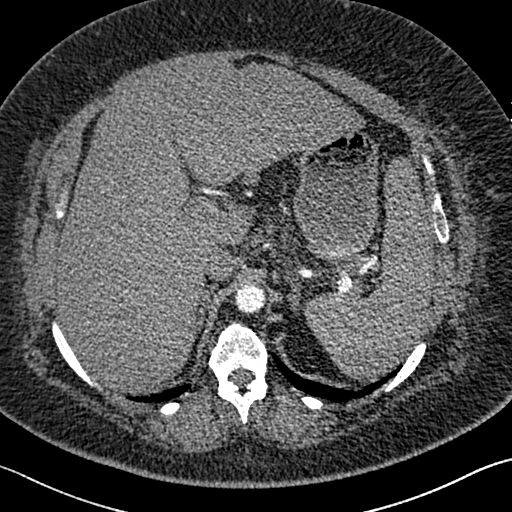
[im 70/321  lung]
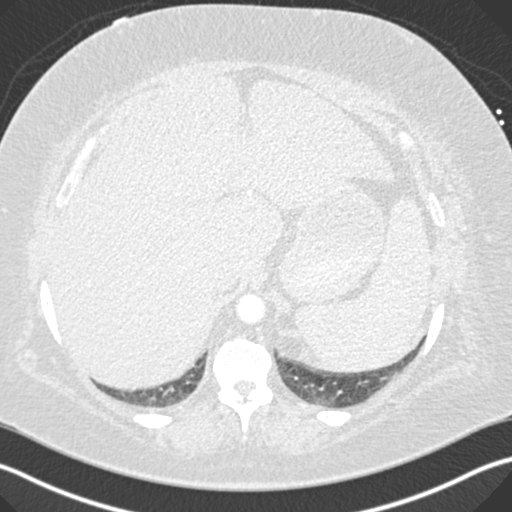
[im 84/321  soft-tissue]
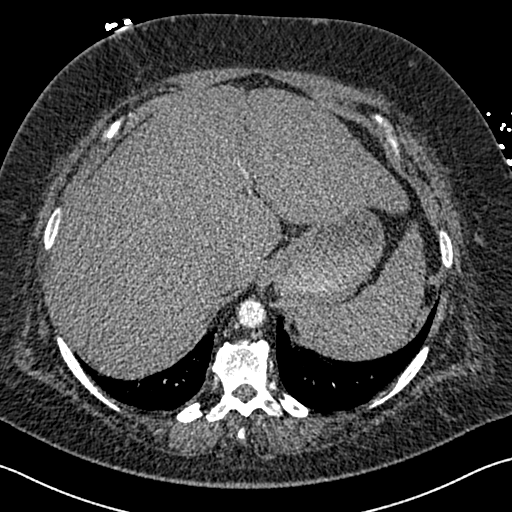
[im 112/321  lung]
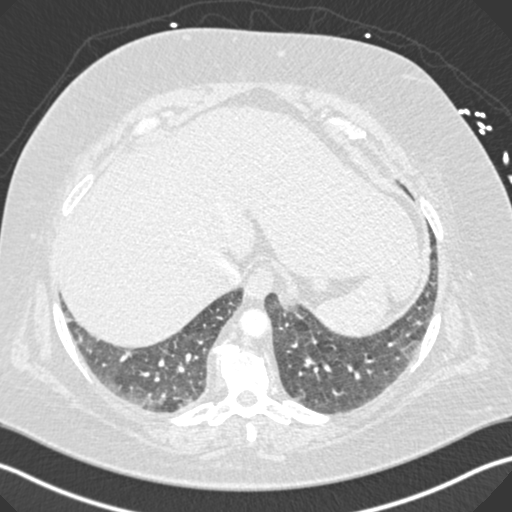
[im 140/321  soft-tissue]
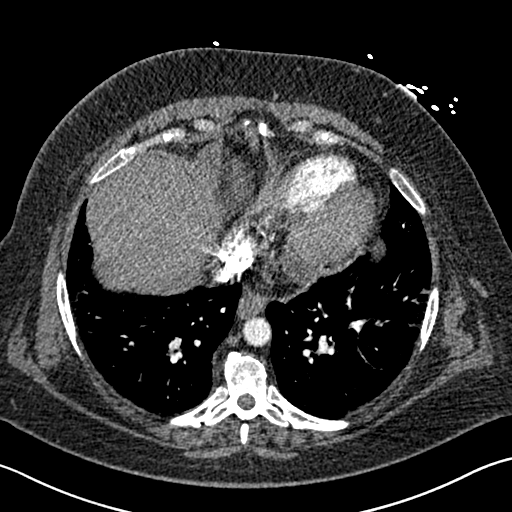
[im 167/321  lung]
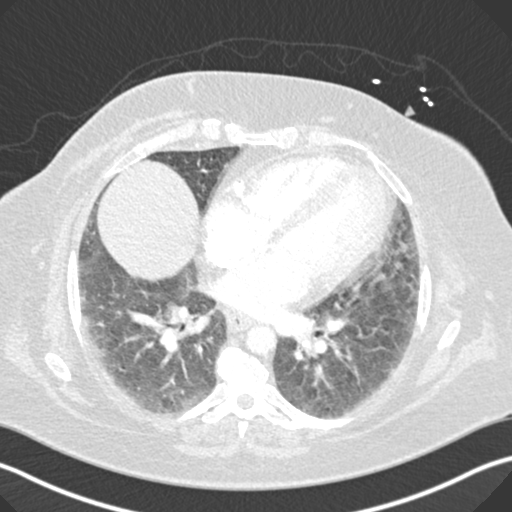
[im 181/321  soft-tissue]
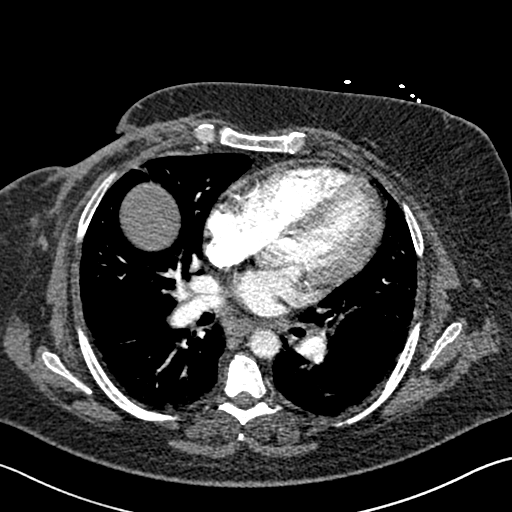
[im 209/321  lung]
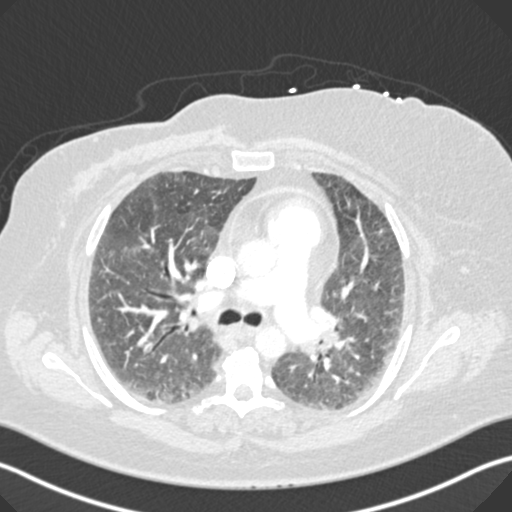
[im 237/321  soft-tissue]
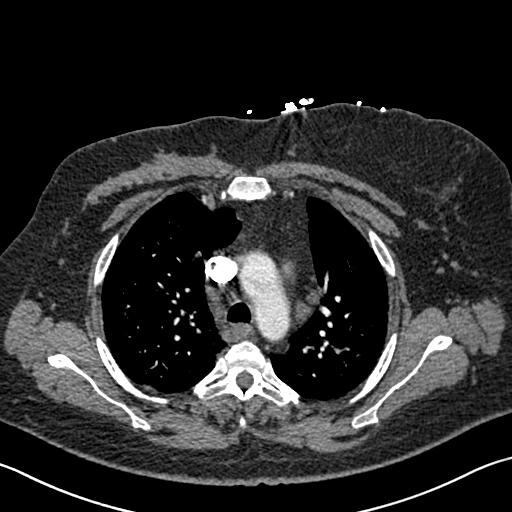
[im 251/321  lung]
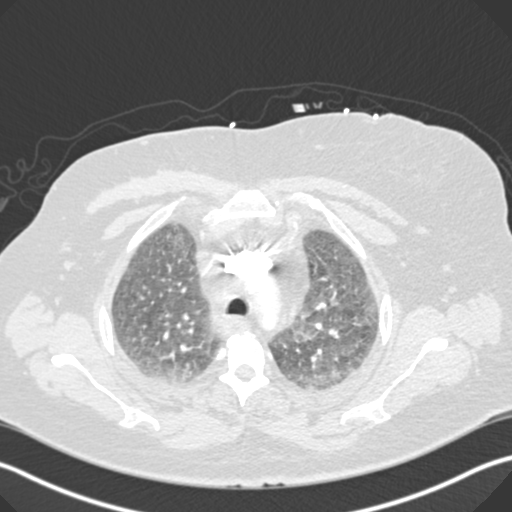
[im 279/321  soft-tissue]
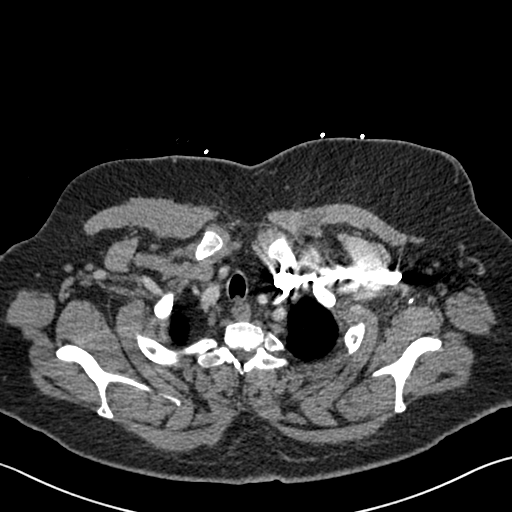
[im 307/321  lung]
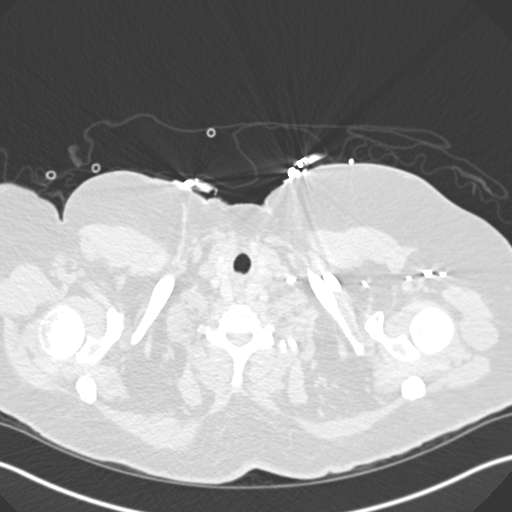

[Series 7: coronal mpr · coronal · 0.63mm/px · 3 of 111 slices shown]
[im 28/111  soft-tissue]
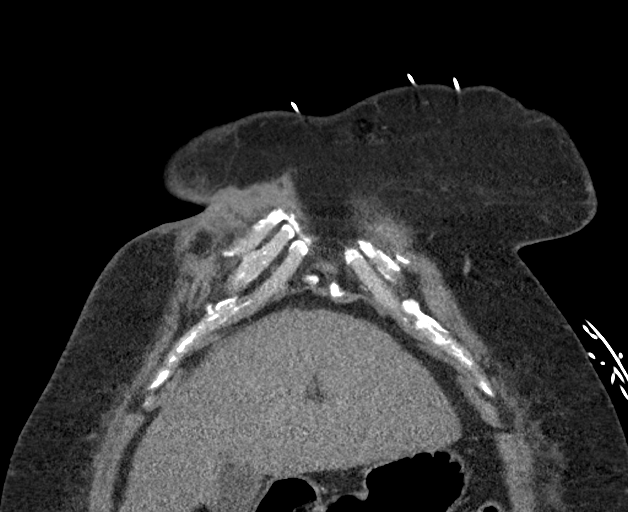
[im 56/111  soft-tissue]
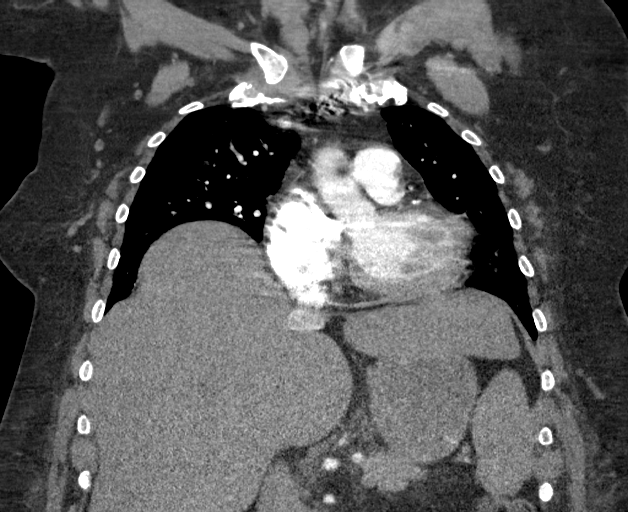
[im 83/111  soft-tissue]
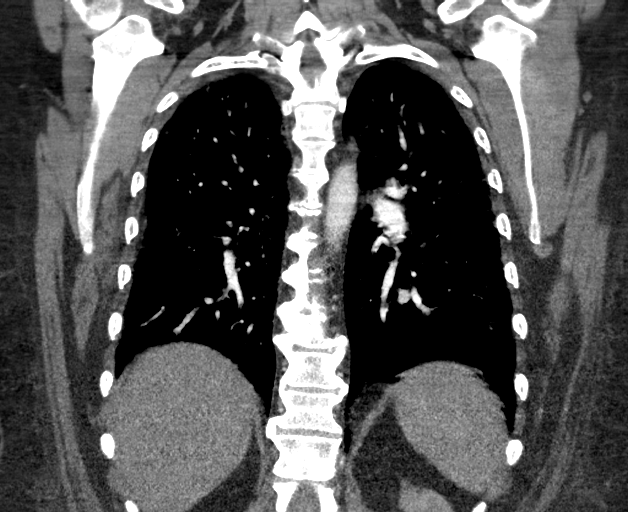

[16 of 46 positions shown; findings below may reference images not displayed]

FINDINGS: Cardiovascular: Satisfactory opacification the pulmonary arteries to
the segmental level. No pulmonary artery filling defects are
identified. Central pulmonary arteries are borderline enlarged with
the pulmonary trunk measuring up to 3.5 cm. This is stable from
comparison and may reflect some chronic pulmonary artery
hypertension. Cardiac size is top normal. Small volume pericardial
effusion is similar to slightly increased from prior imaging.
Three-vessel coronary artery atherosclerosis is seen. The aortic
root is suboptimally assessed given cardiac pulsation artifact.
Atherosclerotic plaque within the normal caliber aorta. No acute
luminal abnormality of the imaged aorta. No periaortic stranding or
hemorrhage. Normal 3 vessel branching of the aortic arch. Proximal
great vessels are mildly calcified but otherwise unremarkable.

Mediastinum/Nodes: Abundant mediastinal fat is noted. Shotty
subcentimeter adenopathy is seen throughout the mediastinum,
particularly in the AP window with a borderline enlarged 1 cm
precarinal node unchanged in size from comparison imaging. No new or
enlarging nodes are seen. No worrisome hilar or axillary adenopathy.
No acute abnormality of the trachea or esophagus. Redemonstrated
subcentimeter partially calcified nodules in the right lobe thyroid
gland, unchanged from comparison.

Lungs/Pleura: Some diffuse mosaic attenuation throughout the lungs
is likely accentuated by imaging during exhalation, possibly
reflecting some underlying small airways disease or air trapping
though additional features of interlobular septal thickening and
vascular redistribution could suggest mild edema as well. There are
multiple small pulmonary nodules again seen throughout both lungs, a
example index nodes to include a 8 mm nodule in the periphery the
right upper lobe (6/43), slightly increased from 7 mm on comparison
[DATE] mm nodule seen in the periphery of the right
lower lobe measuring 10 mm, previously 9 mm. Differences in
measurement may be within the margin of error (6/60).

Upper Abdomen: Hepatomegaly. Diffuse hepatic hypoattenuation
compatible with hepatic steatosis. Sparing along the gallbladder
fossa. Some questionable gallbladder wall thickening is present.
Peripancreatic hazy stranding is present, increasing from prior. No
pancreatic ductal dilatation. Splenomegaly is stable.

Musculoskeletal: Postsurgical changes from prior right mastectomy
with overlying skin thickening and subcutaneous fat
infiltration/scarring with resolution of the previously seen
ill-defined fluid collection along the surgical margins. Some mild
body wall edema is increased from prior.

Review of the MIP images confirms the above findings.
IMPRESSION: 1. No evidence of acute pulmonary artery filling defects to suggest
pulmonary embolism.
2. Central pulmonary arteries are borderline enlarged with the
pulmonary trunk measuring up to 3.5 cm. This is stable from
comparison and may reflect some chronic pulmonary artery
hypertension.
3. Some diffuse mosaic attenuation throughout the lungs is likely
accentuated by imaging during exhalation, possibly reflecting some
underlying small airways disease or air trapping though additional
features of interlobular septal thickening and vascular
redistribution could suggest mild edema as well.
4. Multiple small pulmonary nodules throughout both lungs, similar
to comparison imaging within the margin of measurement error.
Continued stability favors benignity though given known malignancy,
continued attention on follow-up imaging is recommended.
5. Postsurgical changes from prior right mastectomy with overlying
skin thickening and subcutaneous fat infiltration/scarring with
resolution of the previously seen ill-defined fluid collection along
the surgical margins.
6. New peripancreatic hazy stranding, increasing from prior.
Correlate with lipase to exclude acute pancreatitis. Additional
gallbladder wall thickening and pericholecystic hazy stranding is
present as well possibly reflecting concomitant cholecystitis.
Consider right upper quadrant ultrasound
7. Hepatomegaly with hepatic steatosis.
8. Stable splenomegaly.
9. Aortic Atherosclerosis ([id number]).

## 2021-02-15 NOTE — Patient Instructions (Signed)
Visit Information: Thank you for taking the time to speak with me today   PATIENT GOALS:   Goals Addressed             This Visit's Progress    Track and Manage Symptoms-Heart Failure/ High blood pressure   On track    Timeframe:  Long-Range Goal Priority:  High Start Date:   02/15/2021                          Expected End Date:   06/09/2021                    Follow Up Date 03/19/2021   - Take Heart Failure Medications / all medications as prescribed - Weigh daily and record (notify MD with 2 lb weight gain over night or 5 lb in a week).  Contact Care connections as recommended.  - Follow heart failure Action Plan - Adhere to low sodium diet - keep legs up while sitting - watch for swelling in feet, ankles and legs every day   Why is this important?   You will be able to handle your symptoms better if you keep track of them.  Making some simple changes to your lifestyle will help.  Eating healthy is one thing you can do to take good care of yourself.            Consent to CCM Services: Ms. Masih was given information about Chronic Care Management services including:  CCM service includes personalized support from designated clinical staff supervised by her physician, including individualized plan of care and coordination with other care providers 24/7 contact phone numbers for assistance for urgent and routine care needs. Service will only be billed when office clinical staff spend 20 minutes or more in a month to coordinate care. Only one practitioner may furnish and bill the service in a calendar month. The patient may stop CCM services at any time (effective at the end of the month) by phone call to the office staff. The patient will be responsible for cost sharing (co-pay) of up to 20% of the service fee (after annual deductible is met).  Patient agreed to services and verbal consent obtained.   Patient verbalizes understanding of instructions provided today and  agrees to view in [place].   The patient has been provided with contact information for the care management team and has been advised to call with any health related questions or concerns.  The care management team will reach out to the patient again over the next 45 days.   Quinn Plowman RN,BSN,CCM RN Case Manager [place]  [phone]   CLINICAL CARE PLAN: Patient Care Plan: Cardiovascular     Problem Identified: Symptom exacerbation ( heart failure/ Hypertension)   Priority: High     Long-Range Goal: Symptom Exacerbation Prevented or Minimized   Start Date: 02/15/2021  Expected End Date: 06/09/2021  This Visit's Progress: On track  Priority: High  Note:   Current Barriers:  Knowledge deficits related to long term care for self management of Heart failure/ hypertension Unable to perform ADLs independently Unable to perform IADLs independently Patient states she will benefit from ongoing care/ education regarding her heart failure.  Patient with strong family support from husband and son. Patient states she has feet and leg swelling often. She reports taking her medications as prescribed. Patient states she is followed by [facility] and sends report of her weight, blood  pressure, pulse and blood sugars to them daily. Patient states she wears oxygen at 2-4 liters.  RNCM contacted Nadine with [facility] and confirmed patient has been followed by them since April 04, 2018 and is currently in the Palliative care program.  Nurse Case Manager Clinical Goal(s):  patient will weigh self daily and record patient will verbalize understanding of Heart Failure Action Plan and when to call doctor patient will take Heart Failure and all other medications as prescribed Interventions:  Collaboration with Pleas Koch, NP regarding development and update of comprehensive plan of care as evidenced by provider attestation and co-signature Inter-disciplinary care team  collaboration (see longitudinal plan of care) Basic overview and discussion of pathophysiology of Heart Failure Provided written and verbal education on low sodium diet Reviewed Heart Failure Action Plan in depth and provided written copy Assessed for scales in home Discussed importance of daily weight Reviewed role of diuretics in prevention of fluid overload Patient Goals:  - Take Heart Failure Medications / all medications as prescribed - Weigh daily and record (notify MD with 2 lb weight gain over night or 5 lb in a week).  Contact Care connections as recommended.  - Follow heart failure Action Plan - Adhere to low sodium diet - keep legs up while sitting - watch for swelling in feet, ankles and legs every day Follow Up Plan: The patient has been provided with contact information for the care management team and has been advised to call with any health related questions or concerns.  The care management team will reach out to the patient again over the next 45 days.

## 2021-02-15 NOTE — Telephone Encounter (Signed)
CALLED AND GOT HER SET UP FOR 9/21 '@220'$  PM

## 2021-02-15 NOTE — Telephone Encounter (Signed)
Please call patient to make appointment

## 2021-02-15 NOTE — Chronic Care Management (AMB) (Signed)
Chronic Care Management   CCM RN Visit Note  02/15/2021 Name: [name] MRN: [id number] DOB: Aug 23, 1970  Subjective: [name] is a 50 y.o. year old female who is a primary care patient of Nancy Koch, NP. The care management team was consulted for assistance with disease management and care coordination needs.    Engaged with patient by telephone for initial visit in response to provider referral for case management and/or care coordination services.   Consent to Services:  The patient was given the following information about Chronic Care Management services today, agreed to services, and gave verbal consent: 1. CCM service includes personalized support from designated clinical staff supervised by the primary care provider, including individualized plan of care and coordination with other care providers 2. 24/7 contact phone numbers for assistance for urgent and routine care needs. 3. Service will only be billed when office clinical staff spend 20 minutes or more in a month to coordinate care. 4. Only one practitioner may furnish and bill the service in a calendar month. 5.The patient may stop CCM services at any time (effective at the end of the month) by phone call to the office staff. 6. The patient will be responsible for cost sharing (co-pay) of up to 20% of the service fee (after annual deductible is met). Patient agreed to services and consent obtained.  Patient agreed to services and verbal consent obtained.   Assessment: Review of patient past medical history, allergies, medications, health status, including review of consultants reports, laboratory and other test data, was performed as part of comprehensive evaluation and provision of chronic care management services.   SDOH (Social Determinants of Health) assessments and interventions performed:  SDOH Interventions    Flowsheet Row Most Recent Value  SDOH Interventions   Food Insecurity Interventions Intervention Not  Indicated  Housing Interventions Intervention Not Indicated        CCM Care Plan  Allergies  Allergen Reactions   Adhesive [Tape] Rash and Other (See Comments)    TAKES OFF THE SKIN (CERTAIN MEDICAL TAPES DO THIS!!)   Metoprolol Shortness Of Breath    Occurrence of shortness of breath after 3 days   Montelukast Shortness Of Breath   Morphine Sulfate Anaphylaxis, Shortness Of Breath and Nausea And Vomiting    Swollen Throat - Able to tolerate dilaudid   Penicillins Anaphylaxis, Hives and Shortness Of Breath    Throat swells Has patient had a PCN reaction causing immediate rash, facial/tongue/throat swelling, SOB or lightheadedness with hypotension: Yes Has patient had a PCN reaction causing severe rash involving mucus membranes or skin necrosis: No Has patient had a PCN reaction that required hospitalization: Yes Has patient had a PCN reaction occurring within the last 10 years: No If all of the above answers are "NO", then may proceed with Cephalosporin use.    Diltiazem Swelling   Gabapentin Swelling   Midodrine     Lightheaded and falling down    Outpatient Encounter Medications as of 02/15/2021  Medication Sig Note   albuterol (VENTOLIN HFA) 108 (90 Base) MCG/ACT inhaler Inhale 2 puffs into the lungs every 6 (six) hours as needed for wheezing or shortness of breath.    ALPRAZolam (XANAX) 1 MG tablet Take 1 mg by mouth 4 (four) times daily as needed for anxiety.     anastrozole (ARIMIDEX) 1 MG tablet Take 1 tablet (1 mg total) by mouth daily.    aspirin EC 81 MG tablet Take 81 mg by mouth daily before breakfast.  budesonide-formoterol (SYMBICORT) 80-4.5 MCG/ACT inhaler INHALE 2 PUFFS BY MOUTH EVERY 12 HOURS TO PREVENT COUGH OR WHEEZING *RINSE MOUTH AFTER EACH USE*    busPIRone (BUSPAR) 30 MG tablet Take 30 mg by mouth 2 (two) times daily.    citalopram (CELEXA) 20 MG tablet Take 1 tablet (20 mg total) by mouth daily. 02/15/2021: Patient states she takes 2 per day.    Desvenlafaxine ER 100 MG TB24 Take 1 tablet by mouth every morning.    Empagliflozin-metFORMIN HCl ER 25-1000 MG TB24 Take 1 tablet by mouth daily with breakfast.    famotidine (PEPCID) 20 MG tablet Take 1 tablet (20 mg total) by mouth 2 (two) times daily.    OXYGEN Inhale 2-4 L into the lungs 2 (two) times daily as needed (shortness of breath).     pregabalin (LYRICA) 300 MG capsule Take 1 capsule (300 mg total) by mouth 2 (two) times daily. For nerve pain.    rOPINIRole (REQUIP) 1 MG tablet TAKE 1 TABLET BY MOUTH ONCE DAILY AT BEDTIME FOR  RESTLESS  LEGS    rosuvastatin (CRESTOR) 20 MG tablet Take 1 tablet (20 mg total) by mouth daily. For cholesterol.    traZODone (DESYREL) 100 MG tablet Take 200 mg by mouth at bedtime.     XIGDUO XR 5-500 MG TB24 Take 1 tablet by mouth every morning.    desvenlafaxine (PRISTIQ) 100 MG 24 hr tablet Take 100 mg by mouth daily. (Patient not taking: Reported on 02/15/2021)    diclofenac Sodium (VOLTAREN) 1 % GEL Apply 2 g topically 3 (three) times daily as needed. For pain. (Patient not taking: Reported on 02/15/2021)    FLUoxetine (PROZAC) 40 MG capsule Take 40 mg by mouth daily. (Patient not taking: Reported on 02/15/2021)    glucose blood test strip Use as instructed    Insulin Pen Needle 32G X 4 MM MISC Used to give insulin injections twice daily.    insulin regular human CONCENTRATED (HUMULIN R) 500 UNIT/ML injection Inject 30 Units into the skin 3 (three) times daily with meals.     Insulin Syringe-Needle U-100 (GLOBAL INJECT EASE INSULIN SYR) 30G X 1/2" 1 ML MISC See admin instructions.    mupirocin ointment (BACTROBAN) 2 % Apply 1 application topically 2 (two) times daily. Apply to the affected area 2 times a day (Patient not taking: Reported on 02/15/2021)    ReliOn Ultra Thin Lancets 30G MISC Use as directed for blood sugar checks.    ULTICARE MINI PEN NEEDLES 31G X 6 MM MISC USE AS DIRECTED 3 TIMES DAILY    No facility-administered encounter medications on  file as of 02/15/2021.    Patient Active Problem List   Diagnosis Date Noted   Pressure sore 01/29/2021   Hyperlipidemia associated with type 2 diabetes mellitus ([place]) 01/13/2021   Fatigue 12/01/2020   Acute on chronic diastolic heart failure ([place]) 11/16/2020   Chronic migraine without aura without status migrainosus, not intractable 10/01/2020   Dizziness 10/01/2020   Microscopic hematuria 07/01/2020   Pulmonary nodules 07/01/2020   Hav (hallux abducto valgus), unspecified laterality 06/11/2020   Venous ulcer of ankle, left ([place]) 05/15/2020   Contracture of hand joint 05/15/2020   Acute kidney injury superimposed on CKD [facility])    Hyperlipidemia    Carcinoma of lower-outer quadrant of right breast in female, estrogen receptor positive ([place]) 11/12/2019   Restrictive lung disease 10/08/2019   Postmenopausal bleeding 08/30/2019   Carotid stenosis, asymptomatic, right 08/16/2019   Nausea and vomiting 07/15/2019  Cellulitis of left lower extremity 06/04/2019   Multiple wounds of skin 06/04/2019   Other injury of unspecified body region, initial encounter 06/04/2019   (HFpEF) heart failure with preserved ejection fraction ([place]) 11/16/2018   Left-sided weakness 09/13/2018   Weakness of left lower extremity 09/05/2018   Recurrent falls 09/05/2018   PAD (peripheral artery disease) ([place]) 08/27/2018   Chronic congestive heart failure ([place]) 08/02/2018   Vaginal itching 06/15/2018   Chronic upper extremity pain (Secondary Area of Pain) (Bilateral) 04/23/2018   Diabetic polyneuropathy associated with type 2 diabetes mellitus ([place]) 04/23/2018   Long term prescription benzodiazepine use 04/23/2018   Neurogenic pain 04/23/2018   Vitamin D insufficiency 01/29/2018   Insulin dependent type 2 diabetes mellitus ([place]) 01/23/2018   DNR (do not resuscitate) 01/23/2018   CKD stage 3 due to type 2 diabetes mellitus ([place]) 01/23/2018   IBS (irritable bowel syndrome) 01/19/2018   Chronic lower extremity  pain (Primary Area of Pain) (Bilateral) 01/08/2018   Chronic low back pain [facility] Area of Pain) (Bilateral) w/ sciatica (Bilateral) 01/08/2018   Chronic pain syndrome 01/08/2018   Pharmacologic therapy 01/08/2018   Disorder of skeletal system 01/08/2018   Problems influencing health status 01/08/2018   Long term current use of opiate analgesic 01/08/2018   Restless leg syndrome 08/02/2017   Nocturnal hypoxemia 03/29/2017   Vision disturbance 02/28/2017   CKD (chronic kidney disease), stage II 02/28/2017   Visual loss, bilateral    Chronic respiratory failure with hypoxia (HCC)/ nocturnal 02 dep  10/28/2016   Upper airway cough syndrome 08/22/2016   Cigarette smoker 06/15/2016   Simple chronic bronchitis (HCC)    Coronary artery disease involving native heart without angina pectoris 05/16/2016   Chronic diastolic CHF (congestive heart failure) ([place]) 11/04/2015   Orthopnea 11/03/2015   Bilateral leg edema    Diabetes ([place])    COPD exacerbation ([place]) 10/15/2015   Fracture of rib, closed 10/15/2015   Venous stasis of both lower extremities 03/29/2015   Preventative health care 02/04/2015   Hypertension associated with diabetes ([place]) 01/02/2015   Inguinal hernia 11/27/2014   Allergic rhinitis with postnasal drip 01/21/2014   Aortic valve disorders 04/18/2013   Sleep apnea 05/22/2012   Syncope and collapse 10/09/2011   Seizure disorder ([place]) 10/09/2011   Bipolar disorder ([place]) 10/09/2011   TIA (transient ischemic attack) 06/22/2011   Constipation 06/15/2011   HYPERTRIGLYCERIDEMIA 07/17/2007   Situational anxiety 05/30/2007   COPD with chronic bronchitis ([place]) 05/30/2007   Morbid (severe) obesity due to excess calories ([place]) 04/23/2007   Tobacco abuse 09/07/2006    Conditions to be addressed/monitored:CHF and HTN  Care Plan : Cardiovascular  Updates made by Dannielle Karvonen, RN since 02/15/2021 12:00 AM     Problem: Symptom exacerbation ( heart failure/ Hypertension)    Priority: High     Long-Range Goal: Symptom Exacerbation Prevented or Minimized   Start Date: 02/15/2021  Expected End Date: 06/09/2021  This Visit's Progress: On track  Priority: High  Note:   Current Barriers:  Knowledge deficits related to long term care for self management of Heart failure/ hypertension Unable to perform ADLs independently Unable to perform IADLs independently Patient states she will benefit from ongoing care/ education regarding her heart failure.  Patient with strong family support from husband and son. Patient states she has feet and leg swelling often. She reports taking her medications as prescribed. Patient states she is followed by [facility] and sends report of her weight, blood pressure, pulse and blood  sugars to them daily. Patient states she wears oxygen at 2-4 liters.  RNCM contacted Nadine with [facility] and confirmed patient has been followed by them since April 04, 2018 and is currently in the Palliative care program.  Nurse Case Manager Clinical Goal(s):  patient will weigh self daily and record patient will verbalize understanding of Heart Failure Action Plan and when to call doctor patient will take Heart Failure and all other medications as prescribed Interventions:  Collaboration with Nancy Koch, NP regarding development and update of comprehensive plan of care as evidenced by provider attestation and co-signature Inter-disciplinary care team collaboration (see longitudinal plan of care) Basic overview and discussion of pathophysiology of Heart Failure Provided written and verbal education on low sodium diet Reviewed Heart Failure Action Plan in depth and provided written copy Assessed for scales in home Discussed importance of daily weight Reviewed role of diuretics in prevention of fluid overload Patient Goals:  - Take Heart Failure Medications / all medications as prescribed - Weigh daily and record (notify MD with 2 lb  weight gain over night or 5 lb in a week).  Contact Care connections as recommended.  - Follow heart failure Action Plan - Adhere to low sodium diet - keep legs up while sitting - watch for swelling in feet, ankles and legs every day Follow Up Plan: The patient has been provided with contact information for the care management team and has been advised to call with any health related questions or concerns.  The care management team will reach out to the patient again over the next 45 days.           Plan:The patient has been provided with contact information for the care management team and has been advised to call with any health related questions or concerns.  and The care management team will reach out to the patient again over the next 45 days. Quinn Plowman RN,BSN,CCM RN Case Manager [place]  [phone]

## 2021-02-16 ENCOUNTER — Other Ambulatory Visit: Payer: Self-pay

## 2021-02-16 ENCOUNTER — Encounter: Payer: Self-pay | Admitting: Internal Medicine

## 2021-02-16 DIAGNOSIS — F410 Panic disorder [episodic paroxysmal anxiety] without agoraphobia: Secondary | ICD-10-CM | POA: Diagnosis not present

## 2021-02-16 DIAGNOSIS — E11622 Type 2 diabetes mellitus with other skin ulcer: Secondary | ICD-10-CM | POA: Diagnosis not present

## 2021-02-16 DIAGNOSIS — L89623 Pressure ulcer of left heel, stage 3: Secondary | ICD-10-CM | POA: Diagnosis not present

## 2021-02-16 DIAGNOSIS — F341 Dysthymic disorder: Secondary | ICD-10-CM | POA: Diagnosis not present

## 2021-02-16 DIAGNOSIS — E11621 Type 2 diabetes mellitus with foot ulcer: Secondary | ICD-10-CM | POA: Diagnosis not present

## 2021-02-16 NOTE — Telephone Encounter (Signed)
That is fine. Thanks 

## 2021-02-16 NOTE — Progress Notes (Signed)
GABBY, SHELMAN ([id number]) Visit Report for 02/16/2021 Arrival Information Details Patient Name: [name], [name] Date of Service: 02/16/2021 8:15 AM Medical Record Number: [id number] Patient Account Number: [id number] Date of Birth/Sex: December 29, 1970 (50 y.o. F) Treating RN: Dolan Amen Primary Care Adanna Zuckerman: Alma Friendly Other Clinician: Referring Cathey Fredenburg: Alma Friendly Treating Guido Comp/Extender: Skipper Cliche in Treatment: 2 Visit Information History Since Last Visit Pain Present Now: No Patient Arrived: Walker Arrival Time: 08:15 Accompanied By: husband Transfer Assistance: None Patient Identification Verified: Yes Secondary Verification Process Completed: Yes Patient Has Alerts: Yes Patient Alerts: 325 aspirin Type II Diabetic Electronic Signature(s) Signed: 02/16/2021 1:23:29 PM By: Dolan Amen RN Entered By: Dolan Amen on 02/16/2021 08:49:15 Zogg, [name] ([id number]) -------------------------------------------------------------------------------- Clinic Level of Care Assessment Details Patient Name: [name] Date of Service: 02/16/2021 8:15 AM Medical Record Number: [id number] Patient Account Number: [id number] Date of Birth/Sex: 13-Feb-1971 (50 y.o. F) Treating RN: Dolan Amen Primary Care Zay Yeargan: Alma Friendly Other Clinician: Referring Nakeshia Waldeck: Alma Friendly Treating Usbaldo Pannone/Extender: Skipper Cliche in Treatment: 2 Clinic Level of Care Assessment Items TOOL 4 Quantity Score X - Use when only an EandM is performed on FOLLOW-UP visit 1 0 ASSESSMENTS - Nursing Assessment / Reassessment X - Reassessment of Co-morbidities (includes updates in patient status) 1 10 X- 1 5 Reassessment of Adherence to Treatment Plan ASSESSMENTS - Wound and Skin Assessment / Reassessment X - Simple Wound Assessment / Reassessment - one wound 1 5 '[]'$  - 0 Complex Wound Assessment / Reassessment - multiple wounds '[]'$  - 0 Dermatologic / Skin Assessment (not  related to wound area) ASSESSMENTS - Focused Assessment '[]'$  - Circumferential Edema Measurements - multi extremities 0 '[]'$  - 0 Nutritional Assessment / Counseling / Intervention '[]'$  - 0 Lower Extremity Assessment (monofilament, tuning fork, pulses) '[]'$  - 0 Peripheral Arterial Disease Assessment (using hand held doppler) ASSESSMENTS - Ostomy and/or Continence Assessment and Care '[]'$  - Incontinence Assessment and Management 0 '[]'$  - 0 Ostomy Care Assessment and Management (repouching, etc.) PROCESS - Coordination of Care X - Simple Patient / Family Education for ongoing care 1 15 '[]'$  - 0 Complex (extensive) Patient / Family Education for ongoing care '[]'$  - 0 Staff obtains Programmer, systems, Records, Test Results / Process Orders '[]'$  - 0 Staff telephones HHA, Nursing Homes / Clarify orders / etc '[]'$  - 0 Routine Transfer to another Facility (non-emergent condition) '[]'$  - 0 Routine Hospital Admission (non-emergent condition) '[]'$  - 0 New Admissions / Biomedical engineer / Ordering NPWT, Apligraf, etc. '[]'$  - 0 Emergency Hospital Admission (emergent condition) X- 1 10 Simple Discharge Coordination '[]'$  - 0 Complex (extensive) Discharge Coordination PROCESS - Special Needs '[]'$  - Pediatric / Minor Patient Management 0 '[]'$  - 0 Isolation Patient Management '[]'$  - 0 Hearing / Language / Visual special needs '[]'$  - 0 Assessment of Community assistance (transportation, D/C planning, etc.) '[]'$  - 0 Additional assistance / Altered mentation '[]'$  - 0 Support Surface(s) Assessment (bed, cushion, seat, etc.) INTERVENTIONS - Wound Cleansing / Measurement Colton, Alenna ([id number]) X- 1 5 Simple Wound Cleansing - one wound '[]'$  - 0 Complex Wound Cleansing - multiple wounds '[]'$  - 0 Wound Imaging (photographs - any number of wounds) '[]'$  - 0 Wound Tracing (instead of photographs) X- 1 5 Simple Wound Measurement - one wound '[]'$  - 0 Complex Wound Measurement - multiple wounds INTERVENTIONS - Wound Dressings '[]'$  - Small  Wound Dressing one or multiple wounds 0 X- 1 15 Medium Wound Dressing one or multiple wounds '[]'$  - 0 Large Wound Dressing one or  multiple wounds '[]'$  - 0 Application of Medications - topical '[]'$  - 0 Application of Medications - injection INTERVENTIONS - Miscellaneous '[]'$  - External ear exam 0 '[]'$  - 0 Specimen Collection (cultures, biopsies, blood, body fluids, etc.) '[]'$  - 0 Specimen(s) / Culture(s) sent or taken to Lab for analysis '[]'$  - 0 Patient Transfer (multiple staff / Harrel Lemon Lift / Similar devices) '[]'$  - 0 Simple Staple / Suture removal (25 or less) '[]'$  - 0 Complex Staple / Suture removal (26 or more) '[]'$  - 0 Hypo / Hyperglycemic Management (close monitor of Blood Glucose) '[]'$  - 0 Ankle / Brachial Index (ABI) - do not check if billed separately '[]'$  - 0 Vital Signs Has the patient been seen at the hospital within the last three years: Yes Total Score: 70 Level Of Care: New/Established - Level 2 Electronic Signature(s) Signed: 02/16/2021 1:23:29 PM By: Dolan Amen RN Entered By: Dolan Amen on 02/16/2021 08:50:13 [name] ([id number]) -------------------------------------------------------------------------------- Encounter Discharge Information Details Patient Name: [name] Date of Service: 02/16/2021 8:15 AM Medical Record Number: [id number] Patient Account Number: [id number] Date of Birth/Sex: May 13, 1971 (50 y.o. F) Treating RN: Dolan Amen Primary Care Mahi Zabriskie: Alma Friendly Other Clinician: Referring Tommy Minichiello: Alma Friendly Treating Trixy Loyola/Extender: Skipper Cliche in Treatment: 2 Encounter Discharge Information Items Discharge Condition: Stable Ambulatory Status: Walker Discharge Destination: Home Transportation: Private Auto Accompanied By: husband Schedule Follow-up Appointment: Yes Clinical Summary of Care: Electronic Signature(s) Signed: 02/16/2021 1:23:29 PM By: Dolan Amen RN Entered By: Dolan Amen on 02/16/2021  08:49:55 Benning, [name] ([id number]) -------------------------------------------------------------------------------- Wound Assessment Details Patient Name: [name] Date of Service: 02/16/2021 8:15 AM Medical Record Number: [id number] Patient Account Number: [id number] Date of Birth/Sex: August 25, 1970 (50 y.o. F) Treating RN: Dolan Amen Primary Care Sadiya Durand: Alma Friendly Other Clinician: Referring Flynt Breeze: Alma Friendly Treating Clennon Nasca/Extender: Skipper Cliche in Treatment: 2 Wound Status Wound Number: 9 Primary Etiology: Pressure Ulcer Wound Location: Left Calcaneus Wound Status: Open Wounding Event: Blister Date Acquired: 01/13/2021 Weeks Of Treatment: 2 Clustered Wound: No Wound Measurements Length: (cm) 3.2 Width: (cm) 3 Depth: (cm) 0.1 Area: (cm) 7.54 Volume: (cm) 0.754 % Reduction in Area: 20% % Reduction in Volume: 20% Wound Description Classification: Category/Stage III Exudate Amount: Medium Exudate Type: Serosanguineous Exudate Color: red, brown Treatment Notes Wound #9 (Calcaneus) Wound Laterality: Left Cleanser Peri-Wound Care Topical Primary Dressing Xeroform-HBD 2x2 (in/in) Discharge Instruction: Apply Xeroform-HBD 2x2 (in/in) as directed Secondary Dressing [place] Discharge Instruction: Apply Conforming Stretch Guaze Bandage as directed Secured With Coban Cohesive Bandage 4x5 (yds) Stretched Discharge Instruction: Apply Coban as directed. Compression Wrap Compression Stockings Add-Ons Electronic Signature(s) Signed: 02/16/2021 1:23:29 PM By: Dolan Amen RN Entered By: Dolan Amen on 02/16/2021 08:48:00

## 2021-02-16 NOTE — Telephone Encounter (Signed)
Sent to reception to make appointment. They made in a 220 but it is for 40 min is that ok or do you want me to call back and move to a 300?

## 2021-02-17 IMAGING — DX DG CHEST 2V
2 series · 3 of 3 positions shown · non-contrast
Comparison: December 16, 2019 chest radiograph and May 18, 2020
chest CT

CLINICAL DATA: Cough.  History of breast carcinoma

EXAM:
CHEST - 2 VIEW

[Series 1: chest pa · 0.14mm/px · 2 of 2 slices shown]
[im 1/2]
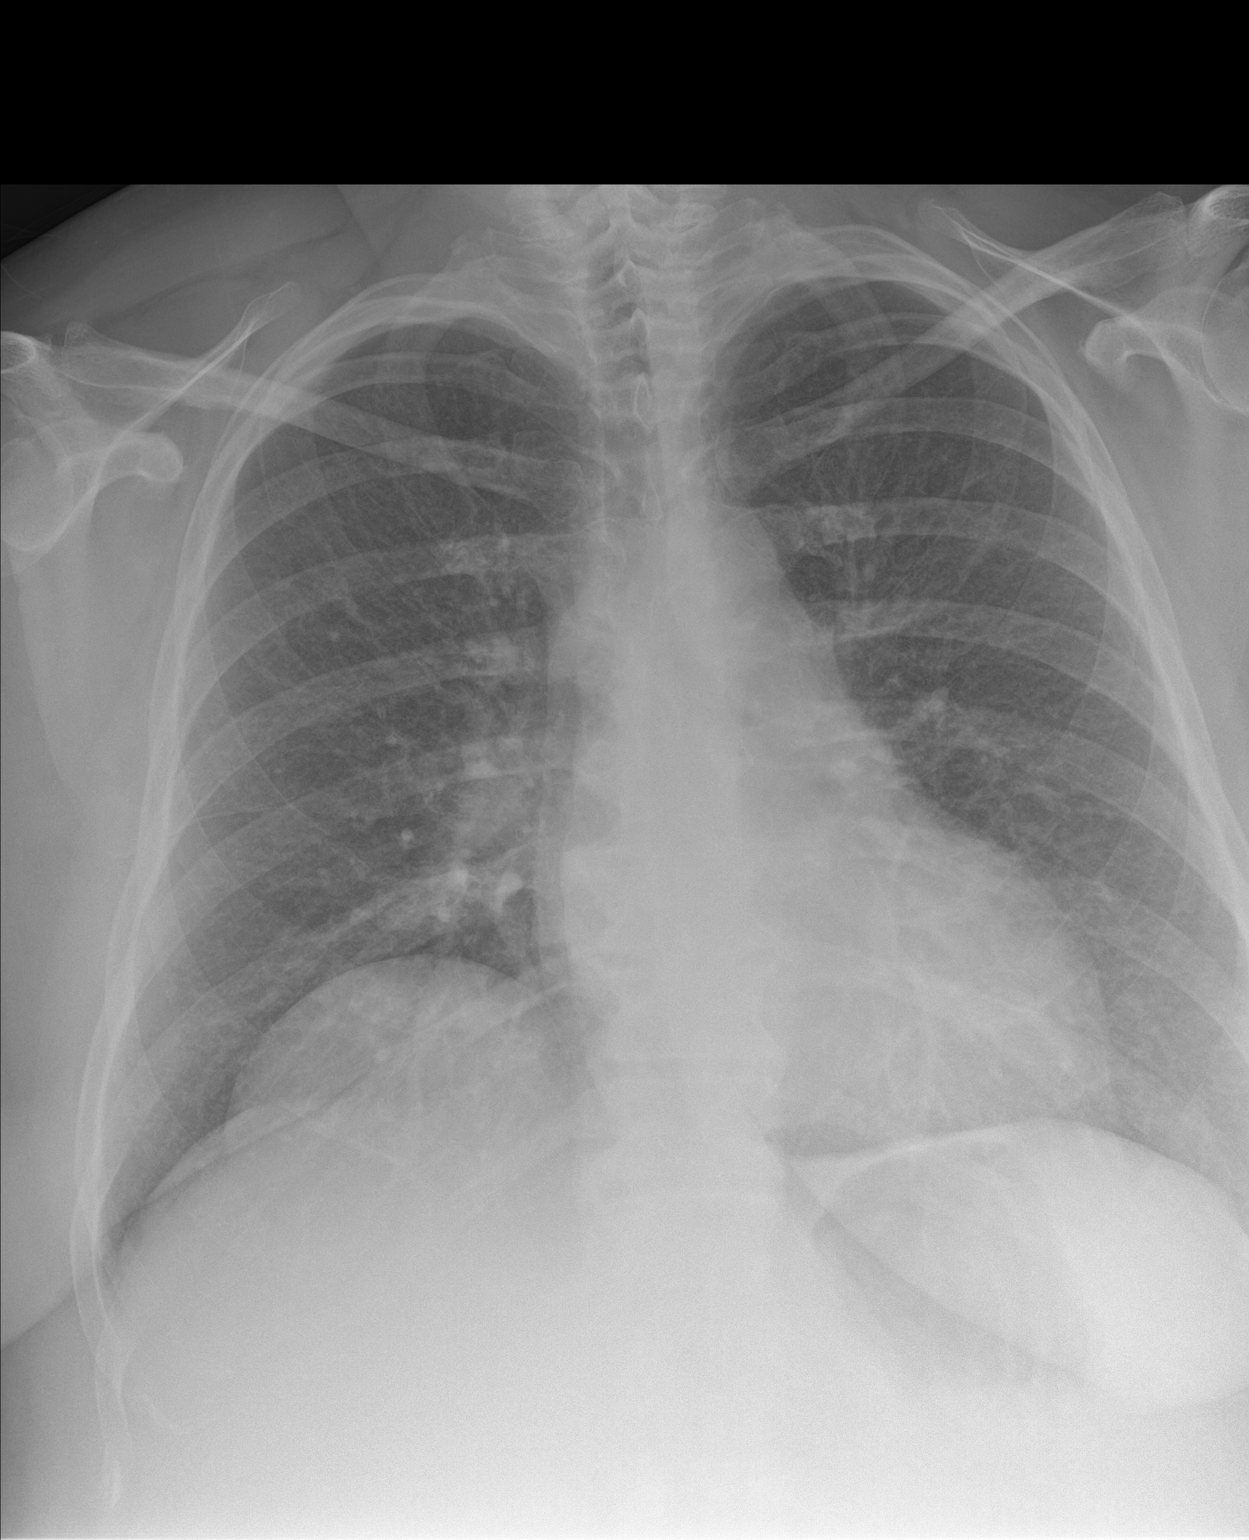
[im 2/2]
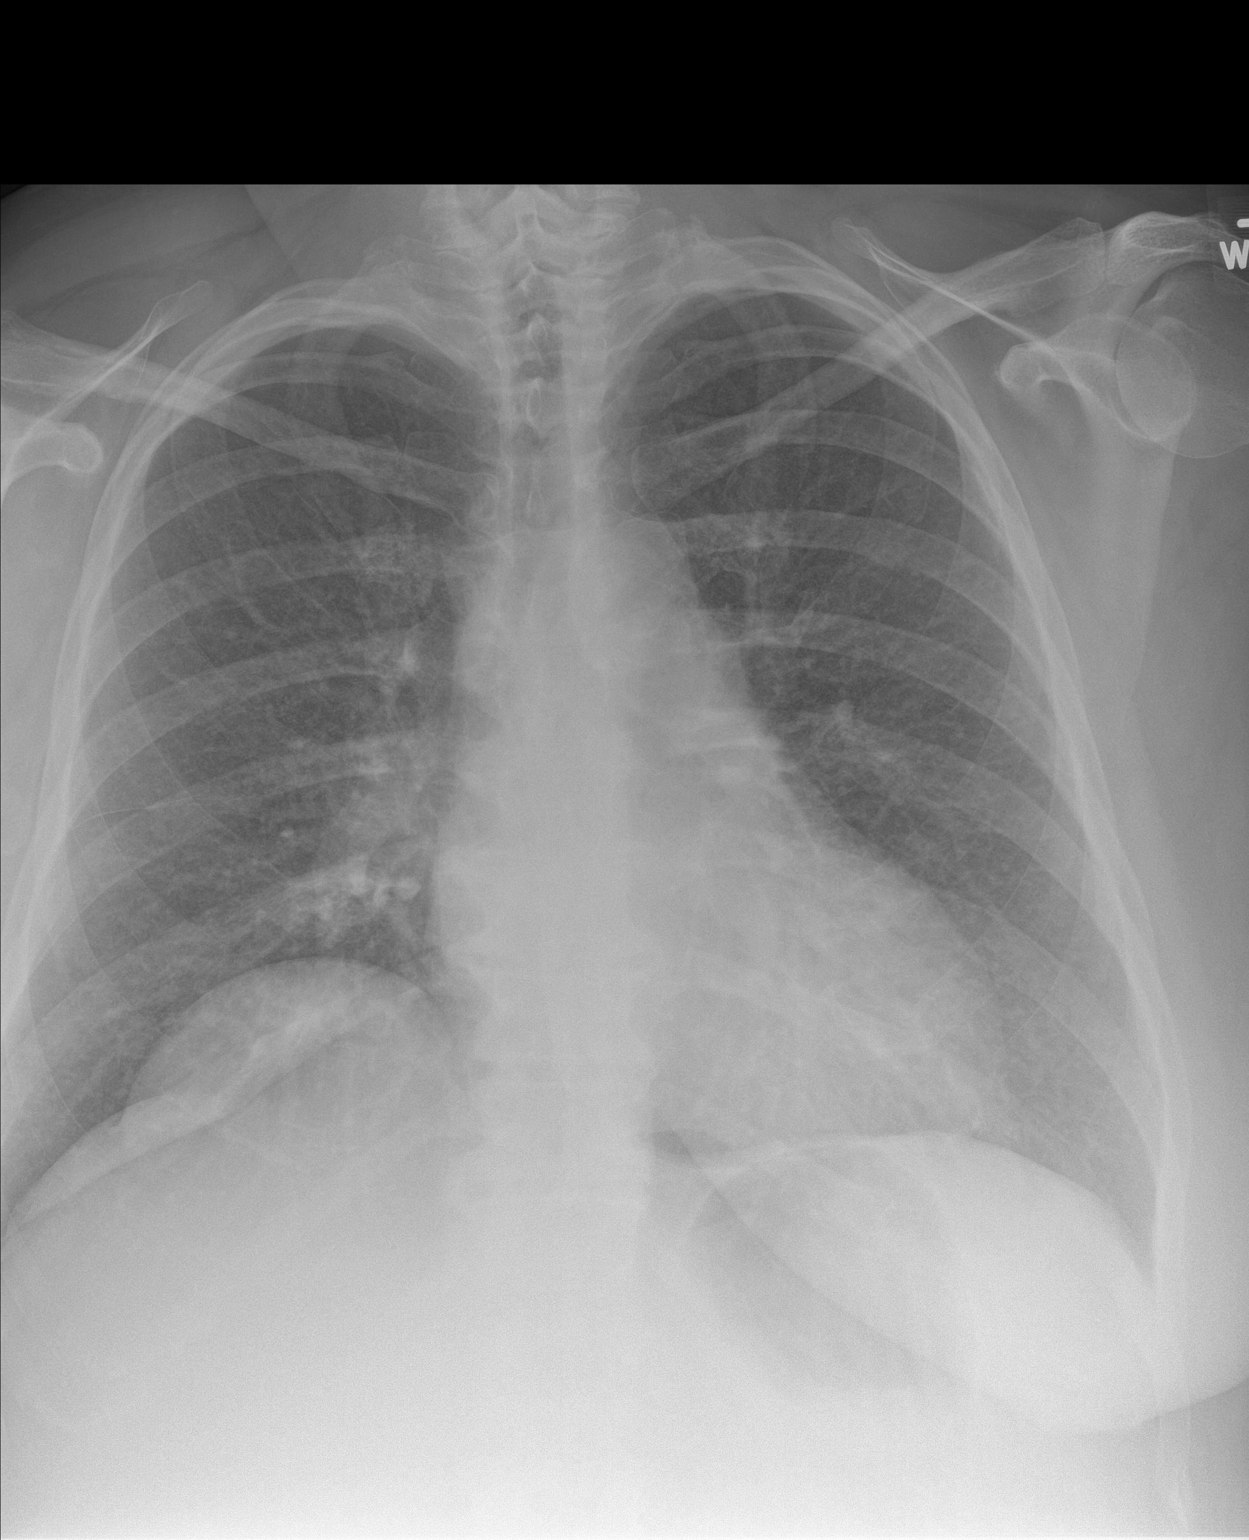

[chest lat]
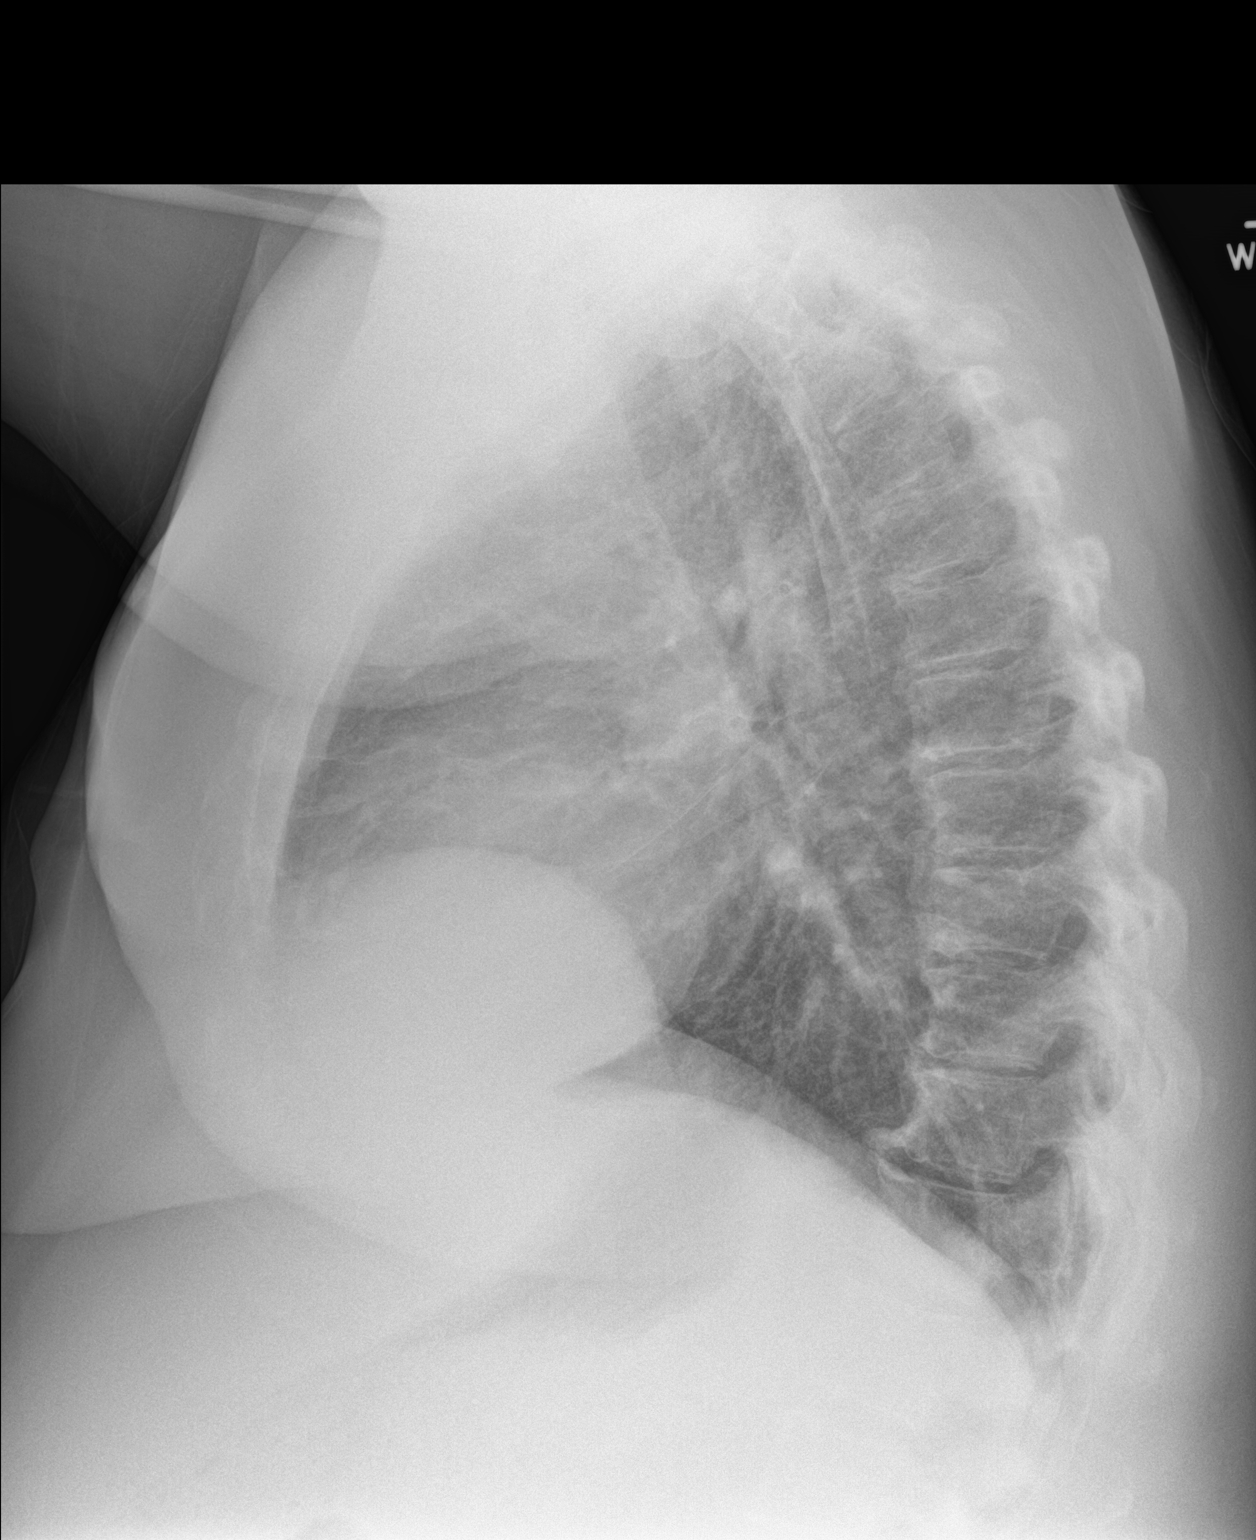

[3 of 3 positions shown; findings below may reference images not displayed]

FINDINGS: Lungs are clear. Heart size and pulmonary vascularity are normal. No
adenopathy. There is degenerative change in the thoracic spine.
There is focal eventration of the right hemidiaphragm, stable.
IMPRESSION: Lungs clear.  Cardiac silhouette within normal limits.

## 2021-02-18 ENCOUNTER — Encounter: Payer: Self-pay | Admitting: *Deleted

## 2021-02-18 ENCOUNTER — Other Ambulatory Visit: Payer: Self-pay | Admitting: Endocrinology

## 2021-02-22 ENCOUNTER — Other Ambulatory Visit: Payer: Self-pay

## 2021-02-22 ENCOUNTER — Encounter: Payer: Self-pay | Admitting: Internal Medicine

## 2021-02-22 MED ORDER — BLOOD GLUCOSE METER KIT: PACK | 0 refills | Status: DC

## 2021-02-23 ENCOUNTER — Ambulatory Visit: Payer: Medicare Other | Admitting: Physician Assistant

## 2021-02-23 DIAGNOSIS — J449 Chronic obstructive pulmonary disease, unspecified: Secondary | ICD-10-CM | POA: Diagnosis not present

## 2021-02-24 ENCOUNTER — Other Ambulatory Visit: Payer: Self-pay | Admitting: *Deleted

## 2021-02-24 DIAGNOSIS — Z170 Estrogen receptor positive status [ER+]: Secondary | ICD-10-CM

## 2021-02-24 DIAGNOSIS — C50511 Malignant neoplasm of lower-outer quadrant of right female breast: Secondary | ICD-10-CM

## 2021-02-26 ENCOUNTER — Encounter: Payer: Self-pay | Admitting: Internal Medicine

## 2021-02-26 DIAGNOSIS — E113513 Type 2 diabetes mellitus with proliferative diabetic retinopathy with macular edema, bilateral: Secondary | ICD-10-CM | POA: Diagnosis not present

## 2021-02-26 DIAGNOSIS — H2513 Age-related nuclear cataract, bilateral: Secondary | ICD-10-CM | POA: Diagnosis not present

## 2021-03-01 ENCOUNTER — Other Ambulatory Visit: Payer: Self-pay

## 2021-03-01 ENCOUNTER — Ambulatory Visit
Admission: RE | Admit: 2021-03-01 | Discharge: 2021-03-01 | Disposition: A | Discharge status: Home / Self Care | Payer: Medicare Other | Source: Ambulatory Visit | Attending: Internal Medicine | Admitting: Internal Medicine

## 2021-03-01 DIAGNOSIS — Z170 Estrogen receptor positive status [ER+]: Secondary | ICD-10-CM

## 2021-03-02 ENCOUNTER — Ambulatory Visit: Payer: Medicare Other | Admitting: Physician Assistant

## 2021-03-02 ENCOUNTER — Other Ambulatory Visit: Payer: Self-pay | Admitting: *Deleted

## 2021-03-02 ENCOUNTER — Other Ambulatory Visit: Payer: Self-pay | Admitting: Primary Care

## 2021-03-02 ENCOUNTER — Ambulatory Visit: Admission: RE | Admit: 2021-03-02 | Payer: Medicare Other | Source: Ambulatory Visit

## 2021-03-02 DIAGNOSIS — G2581 Restless legs syndrome: Secondary | ICD-10-CM

## 2021-03-02 MED ORDER — ANASTROZOLE 1 MG PO TABS: 1.0000 mg | ORAL_TABLET | Freq: Every day | ORAL | 3 refills | Status: DC

## 2021-03-03 ENCOUNTER — Other Ambulatory Visit: Payer: Self-pay

## 2021-03-03 ENCOUNTER — Encounter: Payer: Self-pay | Admitting: Emergency Medicine

## 2021-03-03 ENCOUNTER — Emergency Department: Payer: BC Managed Care – PPO

## 2021-03-03 ENCOUNTER — Inpatient Hospital Stay: Payer: Medicare Other | Admitting: Internal Medicine

## 2021-03-03 ENCOUNTER — Emergency Department
Admission: EM | Admit: 2021-03-03 | Discharge: 2021-03-03 | Disposition: A | Discharge status: Home / Self Care | Payer: BC Managed Care – PPO | Attending: Emergency Medicine | Admitting: Emergency Medicine

## 2021-03-03 ENCOUNTER — Inpatient Hospital Stay: Payer: Medicare Other

## 2021-03-03 ENCOUNTER — Telehealth: Payer: Self-pay

## 2021-03-03 DIAGNOSIS — Z79899 Other long term (current) drug therapy: Secondary | ICD-10-CM | POA: Diagnosis not present

## 2021-03-03 DIAGNOSIS — Z794 Long term (current) use of insulin: Secondary | ICD-10-CM | POA: Insufficient documentation

## 2021-03-03 DIAGNOSIS — G43909 Migraine, unspecified, not intractable, without status migrainosus: Secondary | ICD-10-CM | POA: Diagnosis not present

## 2021-03-03 DIAGNOSIS — J45909 Unspecified asthma, uncomplicated: Secondary | ICD-10-CM | POA: Diagnosis not present

## 2021-03-03 DIAGNOSIS — F17210 Nicotine dependence, cigarettes, uncomplicated: Secondary | ICD-10-CM | POA: Diagnosis not present

## 2021-03-03 DIAGNOSIS — Z743 Need for continuous supervision: Secondary | ICD-10-CM | POA: Diagnosis not present

## 2021-03-03 DIAGNOSIS — I509 Heart failure, unspecified: Secondary | ICD-10-CM | POA: Diagnosis not present

## 2021-03-03 DIAGNOSIS — I130 Hypertensive heart and chronic kidney disease with heart failure and stage 1 through stage 4 chronic kidney disease, or unspecified chronic kidney disease: Secondary | ICD-10-CM | POA: Diagnosis not present

## 2021-03-03 DIAGNOSIS — E1122 Type 2 diabetes mellitus with diabetic chronic kidney disease: Secondary | ICD-10-CM | POA: Diagnosis not present

## 2021-03-03 DIAGNOSIS — I2510 Atherosclerotic heart disease of native coronary artery without angina pectoris: Secondary | ICD-10-CM | POA: Insufficient documentation

## 2021-03-03 DIAGNOSIS — G4489 Other headache syndrome: Secondary | ICD-10-CM | POA: Diagnosis not present

## 2021-03-03 DIAGNOSIS — Z20822 Contact with and (suspected) exposure to covid-19: Secondary | ICD-10-CM | POA: Diagnosis not present

## 2021-03-03 DIAGNOSIS — B349 Viral infection, unspecified: Secondary | ICD-10-CM | POA: Diagnosis not present

## 2021-03-03 DIAGNOSIS — J449 Chronic obstructive pulmonary disease, unspecified: Secondary | ICD-10-CM | POA: Diagnosis not present

## 2021-03-03 DIAGNOSIS — R519 Headache, unspecified: Secondary | ICD-10-CM | POA: Insufficient documentation

## 2021-03-03 DIAGNOSIS — Z853 Personal history of malignant neoplasm of breast: Secondary | ICD-10-CM | POA: Insufficient documentation

## 2021-03-03 DIAGNOSIS — Z7982 Long term (current) use of aspirin: Secondary | ICD-10-CM | POA: Insufficient documentation

## 2021-03-03 DIAGNOSIS — Z7984 Long term (current) use of oral hypoglycemic drugs: Secondary | ICD-10-CM | POA: Insufficient documentation

## 2021-03-03 DIAGNOSIS — R350 Frequency of micturition: Secondary | ICD-10-CM | POA: Diagnosis present

## 2021-03-03 DIAGNOSIS — R52 Pain, unspecified: Secondary | ICD-10-CM | POA: Diagnosis not present

## 2021-03-03 DIAGNOSIS — N182 Chronic kidney disease, stage 2 (mild): Secondary | ICD-10-CM | POA: Diagnosis not present

## 2021-03-03 DIAGNOSIS — I10 Essential (primary) hypertension: Secondary | ICD-10-CM | POA: Diagnosis not present

## 2021-03-03 LAB — URINALYSIS, ROUTINE W REFLEX MICROSCOPIC
Bilirubin Urine: NEGATIVE
Glucose, UA: >=500 mg/dL — AB
Hgb urine dipstick: NEGATIVE
Ketones, ur: NEGATIVE mg/dL
Leukocytes,Ua: NEGATIVE
Nitrite: NEGATIVE
Protein, ur: 100 mg/dL — AB
Specific Gravity, Urine: 1.017 (ref 1.005–1.030)
pH: 6 (ref 5.0–8.0)

## 2021-03-03 LAB — CBC WITH DIFFERENTIAL/PLATELET
Abs Immature Granulocytes: 0.04 10*3/uL (ref 0.00–0.07)
Basophils Absolute: 0.1 10*3/uL (ref 0.0–0.1)
Basophils Relative: 1 %
Eosinophils Absolute: 0.3 10*3/uL (ref 0.0–0.5)
Eosinophils Relative: 4 %
HCT: 39.2 % (ref 36.0–46.0)
Hemoglobin: 13 g/dL (ref 12.0–15.0)
Immature Granulocytes: 1 %
Lymphocytes Relative: 14 %
Lymphs Abs: 1.2 10*3/uL (ref 0.7–4.0)
MCH: 27.5 pg (ref 26.0–34.0)
MCHC: 33.2 g/dL (ref 30.0–36.0)
MCV: 83.1 fL (ref 80.0–100.0)
Monocytes Absolute: 0.5 10*3/uL (ref 0.1–1.0)
Monocytes Relative: 6 %
Neutro Abs: 6 10*3/uL (ref 1.7–7.7)
Neutrophils Relative %: 74 %
Platelets: 144 10*3/uL — ABNORMAL LOW (ref 150–400)
RBC: 4.72 MIL/uL (ref 3.87–5.11)
RDW: 17.5 % — ABNORMAL HIGH (ref 11.5–15.5)
WBC: 8.1 10*3/uL (ref 4.0–10.5)
nRBC: 0 % (ref 0.0–0.2)

## 2021-03-03 LAB — BASIC METABOLIC PANEL
Anion gap: 7 (ref 5–15)
BUN: 24 mg/dL — ABNORMAL HIGH (ref 6–20)
CO2: 29 mmol/L (ref 22–32)
Calcium: 8.9 mg/dL (ref 8.9–10.3)
Chloride: 99 mmol/L (ref 98–111)
Creatinine, Ser: 0.92 mg/dL (ref 0.44–1.00)
GFR, Estimated: >60 mL/min (ref >60)
Glucose, Bld: 295 mg/dL — ABNORMAL HIGH (ref 70–99)
Potassium: 4.4 mmol/L (ref 3.5–5.1)
Sodium: 135 mmol/L (ref 135–145)

## 2021-03-03 LAB — RESP PANEL BY RT-PCR (FLU A&B, COVID) ARPGX2
Influenza A by PCR: NEGATIVE
Influenza B by PCR: NEGATIVE
SARS Coronavirus 2 by RT PCR: NEGATIVE

## 2021-03-03 MED ORDER — KETOROLAC TROMETHAMINE 30 MG/ML IJ SOLN
15.0000 mg | Freq: Once | INTRAMUSCULAR | Status: AC
  Administered 2021-03-03: 15 mg via INTRAVENOUS
  Filled 2021-03-03: qty 1

## 2021-03-03 MED ORDER — METOCLOPRAMIDE HCL 5 MG/ML IJ SOLN
10.0000 mg | Freq: Once | INTRAMUSCULAR | Status: AC
  Administered 2021-03-03: 10 mg via INTRAVENOUS
  Filled 2021-03-03: qty 2

## 2021-03-03 NOTE — Telephone Encounter (Signed)
Noted, will await notes. ° °

## 2021-03-03 NOTE — Telephone Encounter (Signed)
Anna nurse with [facility] left v/m that she had spoken with pt and was concern that pt might have UTI; pt has burning and pain after urination,chills, odor to urine, ? Fever pt said 77 but taking excedrin. Pt wants appt at [facility] in AM or will go to ED. No available appts in AM; I called [name] back and left v/m  and I called pt and spoke with pt. Pt said symptoms started on 03/01/21. Then pt said symptoms may have started on 02/27/21; for past 2 days pt has stayed in bed with oxygen concentrator on 3 - 4 L. Pt said she does not feel good, no energy and extremely tired.pt has head congestion, blurred vision, film over eyes and cannot see well. FBS was 300 and something then pt took metformin and has not felt like taking sugar again. Pt has H/A pain level 8.pt is nauseated, no vomiting or diarrhea. Pt has had dry heaves. SOB upon exertion. No S/T or runny nose. Pt is concerned that her cancer is spreading;pt said she is going to pack a bag and call 911 to take pt to [facility] ED. Sending note to Gentry Fitz NP and [facility] CMA.

## 2021-03-03 NOTE — Discharge Instructions (Addendum)
The CAT scan of your brain was normal.  Your lab work was all reassuring.  Your chest x-ray does not show any pneumonia.  The urine sample was not consistent with UTI.  If you are not feeling improved, please follow-up with your primary care provider.  If you develop fevers or unable to take fluids, please return to the emergency department.

## 2021-03-03 NOTE — ED Provider Notes (Signed)
[facility]  ____________________________________________   Event Date/Time   First MD Initiated Contact with Patient 03/03/21 2102     (approximate)  I have reviewed the triage vital signs and the nursing notes.   HISTORY  Chief Complaint Urinary Frequency and Headache    HPI [name] is a 50 y.o. female past medical history of breast cancer, on chemotherapy, diastolic heart failure, COPD, bipolar disorder, anxiety who presents with multiple symptoms including urinary frequency and burning headache and fatigue.  Symptoms have been going on for the past 3 to 4 days.  She endorses urinary frequency and burning at the end of urination.  She denies any fevers or chills.  She has also had an intermittent headache.  It is anterior in location, throbbing in nature.  Has been coming and going, relieved with Excedrin.  Associated with blurry vision but no numbness or weakness.  She has had similar headaches in the past.  She also endorses some generalized abdominal cramping and several episodes of nausea but no vomiting.  Denies diarrhea or constipation.  She also notes extreme fatigue.         Past Medical History:  Diagnosis Date   Anxiety    takes Prozac daily   Anxiety    Aortic valve calcification    Asthma    Advair and Spirva daily   Asthma    Bipolar disorder (HCC)    Breast cancer, right breast ([place]) 12/23/2019   CAD (coronary artery disease)    a. LHC 11/2013 done for CP/fluid retention: mild disease in prox LAD, mild-mod disease in mRCA, EF 60% with normal LVEDP. b. Normal nuc 03/2016.   Cancer ([place])    Cellulitis and abscess of trunk 03/12/2020   CHF (congestive heart failure) (HCC)    Chronic diastolic CHF (congestive heart failure) (HCC)    Chronic heart failure with preserved ejection fraction ([place]) 11/16/2018   CKD (chronic kidney disease), stage II    COPD (chronic obstructive pulmonary disease) (HCC)    a. nocturnal O2.   COPD (chronic  obstructive pulmonary disease) (HCC)    Coronary artery disease    Decreased urine stream    Diabetes mellitus    Diabetes mellitus without complication (HCC)    Dyspnea    Elevated C-reactive protein (CRP) 01/29/2018   Elevated sedimentation rate 01/29/2018   Elevated serum hCG 01/19/2018   Family history of adverse reaction to anesthesia    mom gets nauseated   Foot pain, bilateral 05/22/2018   GERD (gastroesophageal reflux disease)    takes Pepcid daily   History of blood clots    left leg 3-74yr ago   Hyperlipidemia    Hypertension    Hypertriglyceridemia    Inguinal hernia, left 01/2015   Muscle spasm    Open wound of genital labia    Peripheral neuropathy    RBBB    Seizures ([place]    Sepsis ([place] 01/19/2018   Sinus tachycardia    a. persistent since 2009.   Smokers' cough ([place]    Status post mastectomy, right 01/14/2020   Stroke ([facility] 1989   left sided weakness   TIA (transient ischemic attack)    Tobacco abuse    Vulvar abscess 01/23/2018    Patient Active Problem List   Diagnosis Date Noted   Pressure sore 01/29/2021   Hyperlipidemia associated with type 2 diabetes mellitus ([place] 01/13/2021   Fatigue 12/01/2020   Acute on chronic diastolic heart failure ([place] 11/16/2020  Chronic migraine without aura without status migrainosus, not intractable 10/01/2020   Dizziness 10/01/2020   Microscopic hematuria 07/01/2020   Pulmonary nodules 07/01/2020   Hav (hallux abducto valgus), unspecified laterality 06/11/2020   Venous ulcer of ankle, left ([place]) 05/15/2020   Contracture of hand joint 05/15/2020   Acute kidney injury superimposed on CKD [facility])    Hyperlipidemia    Carcinoma of lower-outer quadrant of right breast in female, estrogen receptor positive ([place]) 11/12/2019   Restrictive lung disease 10/08/2019   Postmenopausal bleeding 08/30/2019   Carotid stenosis, asymptomatic, right 08/16/2019   Nausea and vomiting 07/15/2019   Cellulitis of left lower extremity  06/04/2019   Multiple wounds of skin 06/04/2019   Other injury of unspecified body region, initial encounter 06/04/2019   (HFpEF) heart failure with preserved ejection fraction ([place]) 11/16/2018   Left-sided weakness 09/13/2018   Weakness of left lower extremity 09/05/2018   Recurrent falls 09/05/2018   PAD (peripheral artery disease) ([place]) 08/27/2018   Chronic congestive heart failure ([place]) 08/02/2018   Vaginal itching 06/15/2018   Chronic upper extremity pain (Secondary Area of Pain) (Bilateral) 04/23/2018   Diabetic polyneuropathy associated with type 2 diabetes mellitus ([place]) 04/23/2018   Long term prescription benzodiazepine use 04/23/2018   Neurogenic pain 04/23/2018   Vitamin D insufficiency 01/29/2018   Insulin dependent type 2 diabetes mellitus ([place]) 01/23/2018   DNR (do not resuscitate) 01/23/2018   CKD stage 3 due to type 2 diabetes mellitus ([place]) 01/23/2018   IBS (irritable bowel syndrome) 01/19/2018   Chronic lower extremity pain (Primary Area of Pain) (Bilateral) 01/08/2018   Chronic low back pain [facility] Area of Pain) (Bilateral) w/ sciatica (Bilateral) 01/08/2018   Chronic pain syndrome 01/08/2018   Pharmacologic therapy 01/08/2018   Disorder of skeletal system 01/08/2018   Problems influencing health status 01/08/2018   Long term current use of opiate analgesic 01/08/2018   Restless leg syndrome 08/02/2017   Nocturnal hypoxemia 03/29/2017   Vision disturbance 02/28/2017   CKD (chronic kidney disease), stage II 02/28/2017   Visual loss, bilateral    Chronic respiratory failure with hypoxia (HCC)/ nocturnal 02 dep  10/28/2016   Upper airway cough syndrome 08/22/2016   Cigarette smoker 06/15/2016   Simple chronic bronchitis ([place])    Coronary artery disease involving native heart without angina pectoris 05/16/2016   Chronic diastolic CHF (congestive heart failure) ([place]) 11/04/2015   Orthopnea 11/03/2015   Bilateral leg edema    Diabetes ([place])    COPD exacerbation  ([place]) 10/15/2015   Fracture of rib, closed 10/15/2015   Venous stasis of both lower extremities 03/29/2015   Preventative health care 02/04/2015   Hypertension associated with diabetes ([place]) 01/02/2015   Inguinal hernia 11/27/2014   Allergic rhinitis with postnasal drip 01/21/2014   Aortic valve disorders 04/18/2013   Sleep apnea 05/22/2012   Syncope and collapse 10/09/2011   Seizure disorder ([place]) 10/09/2011   Bipolar disorder ([place]) 10/09/2011   TIA (transient ischemic attack) 06/22/2011   Constipation 06/15/2011   HYPERTRIGLYCERIDEMIA 07/17/2007   Situational anxiety 05/30/2007   COPD with chronic bronchitis ([place]) 05/30/2007   Morbid (severe) obesity due to excess calories ([place]) 04/23/2007   Tobacco abuse 09/07/2006    Past Surgical History:  Procedure Laterality Date   APPLICATION OF WOUND VAC Right 01/29/2020   Procedure: APPLICATION OF WOUND VAC;  Surgeon: Ronny Bacon, MD;  Location: [facility];  Service: General;  Laterality: Right;   BREAST BIOPSY Right 11/06/2019   [place] core path pending venus clip  HERNIA REPAIR Left    INCISION AND DRAINAGE ABSCESS N/A 01/26/2018   Procedure: INCISION AND DEBRIDEMENT OF VULVAR NECROTIZING SOFT TISSUE INFECTION;  Surgeon: Greer Pickerel, MD;  Location: [place];  Service: General;  Laterality: N/A;   INCISION AND DRAINAGE PERIRECTAL ABSCESS N/A 01/22/2018   Procedure: IRRIGATION AND DEBRIDEMENT LABIAL/VULVAR AREA;  Surgeon: Coralie Keens, MD;  Location: [place];  Service: General;  Laterality: N/A;   INCISION AND DRAINAGE PERIRECTAL ABSCESS N/A 01/29/2018   Procedure: IRRIGATION AND DEBRIDEMENT VULVA;  Surgeon: Excell Seltzer, MD;  Location: [place];  Service: General;  Laterality: N/A;   INGUINAL HERNIA REPAIR Left 04/08/2015   Procedure: OPEN LEFT INGUINAL HERNIA REPAIR WITH MESH;  Surgeon: Ralene Ok, MD;  Location: [place];  Service: General;  Laterality: Left;   INSERTION OF MESH Left 04/08/2015   Procedure: INSERTION OF MESH;   Surgeon: Ralene Ok, MD;  Location: [place];  Service: General;  Laterality: Left;   LAPAROSCOPY     Endometriosis   LEFT HEART CATHETERIZATION WITH CORONARY ANGIOGRAM N/A 12/05/2013   Procedure: LEFT HEART CATHETERIZATION WITH CORONARY ANGIOGRAM;  Surgeon: Jettie Booze, MD;  Location: [facility];  Service: Cardiovascular;  Laterality: N/A;   MASTECTOMY Right    right kidney drained     SIMPLE MASTECTOMY WITH AXILLARY SENTINEL NODE BIOPSY Right 12/23/2019   Procedure: Right SIMPLE MASTECTOMY WITH AXILLARY SENTINEL NODE BIOPSY;  Surgeon: Ronny Bacon, MD;  Location: [facility];  Service: General;  Laterality: Right;   TEE WITHOUT CARDIOVERSION N/A 11/28/2013   Procedure: TRANSESOPHAGEAL ECHOCARDIOGRAM (TEE);  Surgeon: Thayer Headings, MD;  Location: [place];  Service: Cardiovascular;  Laterality: N/A;   WOUND DEBRIDEMENT Right 01/29/2020   Procedure: DEBRIDEMENT WOUND, Excisional debridement skin, subcutaneous and muscle right chest wall;  Surgeon: Ronny Bacon, MD;  Location: [facility];  Service: General;  Laterality: Right;    Prior to Admission medications   Medication Sig Start Date End Date Taking? Authorizing Provider  albuterol (VENTOLIN HFA) 108 (90 Base) MCG/ACT inhaler Inhale 2 puffs into the lungs every 6 (six) hours as needed for wheezing or shortness of breath.    [provider]  ALPRAZolam Duanne Moron) 1 MG tablet Take 1 mg by mouth 4 (four) times daily as needed for anxiety.  12/02/19   [provider]  anastrozole (ARIMIDEX) 1 MG tablet Take 1 tablet (1 mg total) by mouth daily. 03/02/21   Cammie Sickle, MD  aspirin EC 81 MG tablet Take 81 mg by mouth daily before breakfast.    [provider]  blood glucose meter kit and supplies Use up to four times daily as directed. (FOR ICD-10 E10.9, E11.9). 02/22/21   Pleas Koch, NP  budesonide-formoterol (SYMBICORT) 80-4.5 MCG/ACT inhaler INHALE 2 PUFFS BY MOUTH EVERY 12 HOURS TO PREVENT  COUGH OR WHEEZING *RINSE MOUTH AFTER EACH USE* 12/01/20   Pleas Koch, NP  busPIRone (BUSPAR) 30 MG tablet Take 30 mg by mouth 2 (two) times daily.    [provider]  citalopram (CELEXA) 20 MG tablet Take 1 tablet (20 mg total) by mouth daily. 12/17/19   Cammie Sickle, MD  desvenlafaxine (PRISTIQ) 100 MG 24 hr tablet Take 100 mg by mouth daily. Patient not taking: Reported on 02/15/2021    [provider]  Desvenlafaxine ER 100 MG TB24 Take 1 tablet by mouth every morning. 12/18/20   [provider]  diclofenac Sodium (VOLTAREN) 1 % GEL Apply 2 g topically 3 (three) times daily  as needed. For pain. Patient not taking: Reported on 02/15/2021 09/01/20   Pleas Koch, NP  Empagliflozin-metFORMIN HCl ER 25-1000 MG TB24 Take 1 tablet by mouth daily with breakfast. 12/03/20 12/03/21  [provider]  famotidine (PEPCID) 20 MG tablet Take 1 tablet (20 mg total) by mouth 2 (two) times daily. 01/17/21 02/16/21  Nolberto Hanlon, MD  FLUoxetine (PROZAC) 40 MG capsule Take 40 mg by mouth daily. Patient not taking: Reported on 02/15/2021 10/27/20   [provider]  glucose blood test strip Use as instructed 12/25/20   Pleas Koch, NP  Insulin Pen Needle 32G X 4 MM MISC Used to give insulin injections twice daily. 08/23/17   Renato Shin, MD  insulin regular human CONCENTRATED (HUMULIN R) 500 UNIT/ML injection Inject 30 Units into the skin 3 (three) times daily with meals.     [provider]  Insulin Syringe-Needle U-100 (GLOBAL INJECT EASE INSULIN SYR) 30G X 1/2" 1 ML MISC See admin instructions. 05/22/20 05/22/21  [provider]  mupirocin ointment (BACTROBAN) 2 % Apply 1 application topically 2 (two) times daily. Apply to the affected area 2 times a day Patient not taking: Reported on 02/15/2021 09/29/20   Newt Minion, MD  OXYGEN Inhale 2-4 L into the lungs 2 (two) times daily as needed (shortness of breath).     [provider]   pregabalin (LYRICA) 300 MG capsule Take 1 capsule (300 mg total) by mouth 2 (two) times daily. For nerve pain. 11/25/20   Pleas Koch, NP  ReliOn Ultra Thin Lancets 30G MISC Use as directed for blood sugar checks. 12/25/20   Pleas Koch, NP  rOPINIRole (REQUIP) 1 MG tablet TAKE 1 TABLET BY MOUTH ONCE DAILY AT BEDTIME FOR  RESTLESS  LEGS 02/13/21   Pleas Koch, NP  rosuvastatin (CRESTOR) 20 MG tablet Take 1 tablet (20 mg total) by mouth daily. For cholesterol. 08/18/20   Pleas Koch, NP  traZODone (DESYREL) 100 MG tablet Take 200 mg by mouth at bedtime.  11/08/18   [provider]  ULTICARE MINI PEN NEEDLES 31G X 6 MM MISC USE AS DIRECTED 3 TIMES DAILY 05/18/20   Renato Shin, MD  XIGDUO XR 5-500 MG TB24 Take 1 tablet by mouth every morning. 01/21/21   [provider]    Allergies Adhesive [tape], Metoprolol, Montelukast, Morphine sulfate, Penicillins, Diltiazem, Gabapentin, and Midodrine  Family History  Problem Relation Age of Onset   Venous thrombosis Brother    Other Brother        BRAIN TUMOR   Asthma Father    Diabetes Father    Coronary artery disease Mother    Hypertension Mother    Diabetes Mother    Breast cancer Mother 46   Asthma Sister    Diabetes type II Brother    Diabetes Brother     Social History Social History   Tobacco Use   Smoking status: Some Days    Packs/day: 0.25    Years: 36.00    Pack years: 9.00    Types: Cigarettes    Start date: 07/11/1981   Smokeless tobacco: Never   Tobacco comments:    1 ppd now  Vaping Use   Vaping Use: Some days  Substance Use Topics   Alcohol use: No   Drug use: No    Review of Systems   Review of Systems  Constitutional:  Positive for appetite change and fatigue. Negative for chills and fever.  HENT:  Negative for congestion and sore throat.   Eyes:  Positive for visual disturbance. Negative for photophobia.  Respiratory:  Negative for cough and shortness of breath.    Cardiovascular:  Negative for chest pain and leg swelling.  Gastrointestinal:  Positive for abdominal pain and nausea. Negative for constipation, diarrhea and vomiting.  Genitourinary:  Positive for dysuria and frequency. Negative for flank pain.  Neurological:  Positive for headaches. Negative for dizziness and light-headedness.  All other systems reviewed and are negative.  Physical Exam Updated Vital Signs BP 132/67   Pulse 73   Temp 98.9 F (37.2 C) (Oral)   Resp 17   Ht 5' 4"  (1.626 m)   Wt 108.9 kg   LMP  (LMP Unknown)   SpO2 94%   BMI 41.20 kg/m   Physical Exam Vitals and nursing note reviewed.  Constitutional:      General: She is not in acute distress.    Appearance: Normal appearance. She is obese. She is not ill-appearing.  HENT:     Head: Normocephalic and atraumatic.     Comments: Mucous membranes are dry Eyes:     General: No scleral icterus.    Extraocular Movements: Extraocular movements intact.     Conjunctiva/sclera: Conjunctivae normal.     Pupils: Pupils are equal, round, and reactive to light.  Pulmonary:     Effort: Pulmonary effort is normal. No respiratory distress.     Breath sounds: No stridor.  Abdominal:     General: There is distension.     Palpations: Abdomen is soft.     Tenderness: There is no abdominal tenderness. There is no guarding.  Musculoskeletal:        General: No deformity or signs of injury.     Cervical back: Normal range of motion.  Skin:    General: Skin is dry.     Coloration: Skin is not jaundiced or pale.  Neurological:     General: No focal deficit present.     Mental Status: She is alert and oriented to person, place, and time. Mental status is at baseline.     Comments: Pupils equal round and reactive bilaterally, extraocular movements intact, face is symmetric, normal tongue movement 5 out of 5 strength in the bilateral lower extremities and upper extremities Finger-nose-finger intact bilateral upper extremities   Psychiatric:        Mood and Affect: Mood normal.        Behavior: Behavior normal.     LABS (all labs ordered are listed, but only abnormal results are displayed)  Labs Reviewed  CBC WITH DIFFERENTIAL/PLATELET - Abnormal; Notable for the following components:      Result Value   RDW 17.5 (*)    Platelets 144 (*)    All other components within normal limits  BASIC METABOLIC PANEL - Abnormal; Notable for the following components:   Glucose, Bld 295 (*)    BUN 24 (*)    All other components within normal limits  URINALYSIS, ROUTINE W REFLEX MICROSCOPIC - Abnormal; Notable for the following components:   Color, Urine YELLOW (*)    APPearance CLEAR (*)    Glucose, UA >=500 (*)    Protein, ur 100 (*)    Bacteria, UA RARE (*)    All other components within normal limits  RESP PANEL BY RT-PCR (FLU A&B, COVID) [id number]   ____________________________________________  EKG  Normal sinus rhythm, right bundle branch block, left anterior fascicular block, no acute ischemic changes ____________________________________________  RADIOLOGY  I, Madelin Headings, personally viewed and evaluated these images (plain radiographs) as part of my medical decision making, as well as reviewing the written report by the radiologist.  ED MD interpretation: I reviewed the chest x-ray which did not show any acute cardiopulmonary process    ____________________________________________   PROCEDURES  Procedure(s) performed (including Critical Care):  Procedures   ____________________________________________   INITIAL IMPRESSION / ASSESSMENT AND PLAN / ED COURSE     Patient is 50 year old female with history of breast cancer on chemotherapy who presents with headaches, dysuria, myalgias and fatigue.  She looks well and vital signs within normal limits.  Obtain screening labs given she is immunosuppressed which are all reassuring.  Chest x-ray does not show any infiltrate.  Her neurologic exam  is normal, obtain CT head given her history of breast cancer and potential for metastatic disease which is negative.  Her UA despite having dysuria has no evidence of infection.  I suspect viral syndrome.  COVID and influenza sent.  Patient treated supportively for her headache.  Stable for discharge.      ____________________________________________   FINAL CLINICAL IMPRESSION(S) / ED DIAGNOSES  Final diagnoses:  Viral syndrome     ED Discharge Orders     None        Note:  This document was prepared using Dragon voice recognition software and may include unintentional dictation errors.    Rada Hay, MD 03/04/21 [phone]

## 2021-03-03 NOTE — ED Triage Notes (Signed)
Pt comes [facility] from home with headache starting Saturday along with painful urination/dark urine. Pt has intentionally slowed her drinking water because she doesn't want to pee as often. Pt is being treated for breast and brain cancer at cancer center. 128/72. HR 70. 97% RA.

## 2021-03-03 NOTE — ED Notes (Signed)
Patient transported to CT 

## 2021-03-04 ENCOUNTER — Telehealth: Payer: Self-pay | Admitting: Primary Care

## 2021-03-04 NOTE — Telephone Encounter (Signed)
Mrs. January called in wanted to know if [name] could read her results from hospital visit yesterday.

## 2021-03-04 NOTE — Telephone Encounter (Signed)
Called patient back she thought that she was told that she needed to be seen in office today for UTI. I have reviewed the discharge notes with her and let her know that the hospital did not states she had UTI only viral infection. We do have her set up for follow up with you on 9/1. If symptoms changes she will give our office a call back.

## 2021-03-04 NOTE — Telephone Encounter (Signed)
Noted  

## 2021-03-04 NOTE — Telephone Encounter (Signed)
Patient is calling in stating that she wanted to ask [name] if she needs to go to Urgent care or Emergency room. As when she urinates she still has really bad burning and stinging sensation, believes she does have a UTI. Wants a call from [place] and not [name].

## 2021-03-04 NOTE — Telephone Encounter (Signed)
Called patient we had opening for tomorrow. Put her in but let her know she need to give urine sample and would only address UTI symptoms at this appointment.

## 2021-03-04 NOTE — Telephone Encounter (Signed)
Noted and agree.  Hospital notes reviewed.

## 2021-03-05 ENCOUNTER — Encounter: Payer: Self-pay | Admitting: Internal Medicine

## 2021-03-05 ENCOUNTER — Other Ambulatory Visit: Payer: Self-pay

## 2021-03-05 ENCOUNTER — Encounter: Payer: Self-pay | Admitting: Primary Care

## 2021-03-05 ENCOUNTER — Ambulatory Visit (INDEPENDENT_AMBULATORY_CARE_PROVIDER_SITE_OTHER): Payer: BC Managed Care – PPO | Admitting: Primary Care

## 2021-03-05 VITALS — BP 134/82 | HR 82 | Temp 98.6°F | Ht 64.0 in | Wt 248.0 lb

## 2021-03-05 DIAGNOSIS — G43709 Chronic migraine without aura, not intractable, without status migrainosus: Secondary | ICD-10-CM | POA: Diagnosis not present

## 2021-03-05 DIAGNOSIS — I2510 Atherosclerotic heart disease of native coronary artery without angina pectoris: Secondary | ICD-10-CM | POA: Diagnosis not present

## 2021-03-05 DIAGNOSIS — R300 Dysuria: Secondary | ICD-10-CM | POA: Insufficient documentation

## 2021-03-05 DIAGNOSIS — Z23 Encounter for immunization: Secondary | ICD-10-CM

## 2021-03-05 LAB — POC URINALSYSI DIPSTICK (AUTOMATED)
Bilirubin, UA: NEGATIVE
Glucose, UA: POSITIVE — AB
Ketones, UA: NEGATIVE
Leukocytes, UA: NEGATIVE
Nitrite, UA: NEGATIVE
Protein, UA: POSITIVE — AB
Spec Grav, UA: 1.01 (ref 1.010–1.025)
Urobilinogen, UA: 0.2 E.U./dL
pH, UA: 6 (ref 5.0–8.0)

## 2021-03-05 MED ORDER — TOPIRAMATE 50 MG PO TABS: 50.0000 mg | ORAL_TABLET | Freq: Every day | ORAL | 0 refills | Status: DC

## 2021-03-05 NOTE — Addendum Note (Signed)
Addended by: Francella Solian on: 03/05/2021 12:35 PM   Modules accepted: Orders

## 2021-03-05 NOTE — Progress Notes (Signed)
Subjective:    Patient ID: [name], female    DOB: 1970/12/16, 50 y.o.   MRN: [id number]  HPI  [name] is a very pleasant 50 y.o. female with a significant medical history including breast cancer, TIA, CAD, CHF, COPD, sleep apnea, uncontrolled type 2 diabetes, UTI, CKD chronic respiratory failure who presents today for ED follow up, dysuria, and frequent headaches.   She was evaluated at [facility] ED on 03/03/21 for urinary frequency and dysuria, headaches, abdominal cramping, nausea without vomiting, fatigue that began about 3-4 days prior.   Work up in the ED consisted of chest xray which was negative for infiltrate, CT head negative, labs without concern for acute process, UA without evidence for infection-no culture collected. She was discharged home later with a diagnosis of viral syndrome. Her Covid-19 test returned negative.   Today she is mostly concerned about burning towards the end of urination which began about 10 days ago. She is following with wound management for her left heel ulcer. She denies fevers, urinary frequency. She did notice hematuria last week but this abated.   She has a chronic history of frequent headaches that date back to her teenage years. Her headaches are mostly located to the parietal lobes. Headaches occur daily which last hours, migraines are "daily". She takes Excedrin Migraine without much improvement. She notices sensitivity to light and sound. She's never been on daily treatment for headache prevention.   BP Readings from Last 3 Encounters:  03/05/21 134/82  03/03/21 132/67  02/03/21 133/63      Review of Systems  Constitutional:  Negative for fever.  Genitourinary:  Positive for dysuria. Negative for flank pain, frequency, urgency, vaginal bleeding and vaginal discharge.  Neurological:  Positive for headaches.        Past Medical History:  Diagnosis Date   Anxiety    takes Prozac daily   Anxiety    Aortic valve calcification     Asthma    Advair and Spirva daily   Asthma    Bipolar disorder (HCC)    Breast cancer, right breast ([place]) 12/23/2019   CAD (coronary artery disease)    a. LHC 11/2013 done for CP/fluid retention: mild disease in prox LAD, mild-mod disease in mRCA, EF 60% with normal LVEDP. b. Normal nuc 03/2016.   Cancer ([place])    Cellulitis and abscess of trunk 03/12/2020   CHF (congestive heart failure) (HCC)    Chronic diastolic CHF (congestive heart failure) (HCC)    Chronic heart failure with preserved ejection fraction ([place]) 11/16/2018   CKD (chronic kidney disease), stage II    COPD (chronic obstructive pulmonary disease) (HCC)    a. nocturnal O2.   COPD (chronic obstructive pulmonary disease) (HCC)    Coronary artery disease    Decreased urine stream    Diabetes mellitus    Diabetes mellitus without complication (HCC)    Dyspnea    Elevated C-reactive protein (CRP) 01/29/2018   Elevated sedimentation rate 01/29/2018   Elevated serum hCG 01/19/2018   Family history of adverse reaction to anesthesia    mom gets nauseated   Foot pain, bilateral 05/22/2018   GERD (gastroesophageal reflux disease)    takes Pepcid daily   History of blood clots    left leg 3-35yr ago   Hyperlipidemia    Hypertension    Hypertriglyceridemia    Inguinal hernia, left 01/2015   Muscle spasm    Open wound of genital labia    Peripheral neuropathy  RBBB    Seizures ([place])    Sepsis ([place]) 01/19/2018   Sinus tachycardia    a. persistent since 2009.   Smokers' cough ([place])    Status post mastectomy, right 01/14/2020   Stroke [facility]) 1989   left sided weakness   TIA (transient ischemic attack)    Tobacco abuse    Vulvar abscess 01/23/2018    Social History   Socioeconomic History   Marital status: Married    Spouse name: Not on file   Number of children: 2   Years of education: Not on file   Highest education level: Not on file  Occupational History   Not on file  Tobacco Use   Smoking status: Some Days     Packs/day: 0.25    Years: 36.00    Pack years: 9.00    Types: Cigarettes    Start date: 07/11/1981   Smokeless tobacco: Never   Tobacco comments:    1 ppd now  Vaping Use   Vaping Use: Some days  Substance and Sexual Activity   Alcohol use: No   Drug use: No   Sexual activity: Not Currently  Other Topics Concern   Not on file  Social History Narrative   ** Merged History Encounter **       Lives in [place]   Married but lives with Boyfriend - not legally separated   Disabled - for BiPolar, Seizure disorder, diabetes   Formerly worked at [employer] 1 ppd; no alcohol. 2 step sons; no biologic children.       Social Determinants of Radio broadcast assistant Strain: Not on file  Food Insecurity: No Food Insecurity   Worried About Charity fundraiser in the Last Year: Never true   Ran Out of Food in the Last Year: Never true  Transportation Needs: Not on file  Physical Activity: Not on file  Stress: Not on file  Social Connections: Not on file  Intimate Partner Violence: Not on file    Past Surgical History:  Procedure Laterality Date   APPLICATION OF WOUND VAC Right 01/29/2020   Procedure: APPLICATION OF WOUND VAC;  Surgeon: Ronny Bacon, MD;  Location: [facility];  Service: General;  Laterality: Right;   BREAST BIOPSY Right 11/06/2019   [place] core path pending venus clip   HERNIA REPAIR Left    INCISION AND DRAINAGE ABSCESS N/A 01/26/2018   Procedure: INCISION AND DEBRIDEMENT OF VULVAR NECROTIZING SOFT TISSUE INFECTION;  Surgeon: Greer Pickerel, MD;  Location: [place];  Service: General;  Laterality: N/A;   INCISION AND DRAINAGE PERIRECTAL ABSCESS N/A 01/22/2018   Procedure: IRRIGATION AND DEBRIDEMENT LABIAL/VULVAR AREA;  Surgeon: Coralie Keens, MD;  Location: [place];  Service: General;  Laterality: N/A;   INCISION AND DRAINAGE PERIRECTAL ABSCESS N/A 01/29/2018   Procedure: IRRIGATION AND DEBRIDEMENT VULVA;  Surgeon: Excell Seltzer, MD;  Location: [place];   Service: General;  Laterality: N/A;   INGUINAL HERNIA REPAIR Left 04/08/2015   Procedure: OPEN LEFT INGUINAL HERNIA REPAIR WITH MESH;  Surgeon: Ralene Ok, MD;  Location: [place];  Service: General;  Laterality: Left;   INSERTION OF MESH Left 04/08/2015   Procedure: INSERTION OF MESH;  Surgeon: Ralene Ok, MD;  Location: [place];  Service: General;  Laterality: Left;   LAPAROSCOPY     Endometriosis   LEFT HEART CATHETERIZATION WITH CORONARY ANGIOGRAM N/A 12/05/2013   Procedure: LEFT HEART CATHETERIZATION WITH CORONARY ANGIOGRAM;  Surgeon: Jettie Booze, MD;  Location: [facility];  Service: Cardiovascular;  Laterality: N/A;   MASTECTOMY Right    right kidney drained     SIMPLE MASTECTOMY WITH AXILLARY SENTINEL NODE BIOPSY Right 12/23/2019   Procedure: Right SIMPLE MASTECTOMY WITH AXILLARY SENTINEL NODE BIOPSY;  Surgeon: Ronny Bacon, MD;  Location: [facility];  Service: General;  Laterality: Right;   TEE WITHOUT CARDIOVERSION N/A 11/28/2013   Procedure: TRANSESOPHAGEAL ECHOCARDIOGRAM (TEE);  Surgeon: Thayer Headings, MD;  Location: [place];  Service: Cardiovascular;  Laterality: N/A;   WOUND DEBRIDEMENT Right 01/29/2020   Procedure: DEBRIDEMENT WOUND, Excisional debridement skin, subcutaneous and muscle right chest wall;  Surgeon: Ronny Bacon, MD;  Location: [facility];  Service: General;  Laterality: Right;    Family History  Problem Relation Age of Onset   Venous thrombosis Brother    Other Brother        BRAIN TUMOR   Asthma Father    Diabetes Father    Coronary artery disease Mother    Hypertension Mother    Diabetes Mother    Breast cancer Mother 72   Asthma Sister    Diabetes type II Brother    Diabetes Brother     Allergies  Allergen Reactions   Adhesive [Tape] Rash and Other (See Comments)    TAKES OFF THE SKIN (CERTAIN MEDICAL TAPES DO THIS!!)   Metoprolol Shortness Of Breath    Occurrence of shortness of breath after 3 days   Montelukast Shortness Of  Breath   Morphine Sulfate Anaphylaxis, Shortness Of Breath and Nausea And Vomiting    Swollen Throat - Able to tolerate dilaudid   Penicillins Anaphylaxis, Hives and Shortness Of Breath    Throat swells Has patient had a PCN reaction causing immediate rash, facial/tongue/throat swelling, SOB or lightheadedness with hypotension: Yes Has patient had a PCN reaction causing severe rash involving mucus membranes or skin necrosis: No Has patient had a PCN reaction that required hospitalization: Yes Has patient had a PCN reaction occurring within the last 10 years: No If all of the above answers are "NO", then may proceed with Cephalosporin use.    Diltiazem Swelling   Gabapentin Swelling   Midodrine     Lightheaded and falling down    Current Outpatient Medications on File Prior to Visit  Medication Sig Dispense Refill   albuterol (VENTOLIN HFA) 108 (90 Base) MCG/ACT inhaler Inhale 2 puffs into the lungs every 6 (six) hours as needed for wheezing or shortness of breath.     ALPRAZolam (XANAX) 1 MG tablet Take 1 mg by mouth 4 (four) times daily as needed for anxiety.      anastrozole (ARIMIDEX) 1 MG tablet Take 1 tablet (1 mg total) by mouth daily. 30 tablet 3   aspirin EC 81 MG tablet Take 81 mg by mouth daily before breakfast.     blood glucose meter kit and supplies Use up to four times daily as directed. (FOR ICD-10 E10.9, E11.9). 1 each 0   budesonide-formoterol (SYMBICORT) 80-4.5 MCG/ACT inhaler INHALE 2 PUFFS BY MOUTH EVERY 12 HOURS TO PREVENT COUGH OR WHEEZING *RINSE MOUTH AFTER EACH USE* 10.2 g 2   busPIRone (BUSPAR) 30 MG tablet Take 30 mg by mouth 2 (two) times daily.     citalopram (CELEXA) 20 MG tablet Take 1 tablet (20 mg total) by mouth daily. 30 tablet 2   desvenlafaxine (PRISTIQ) 100 MG 24 hr tablet Take 100 mg by mouth daily.     Desvenlafaxine ER 100 MG TB24 Take 1  tablet by mouth every morning.     diclofenac Sodium (VOLTAREN) 1 % GEL Apply 2 g topically 3 (three) times  daily as needed. For pain. 100 g 0   Empagliflozin-metFORMIN HCl ER 25-1000 MG TB24 Take 1 tablet by mouth daily with breakfast.     famotidine (PEPCID) 20 MG tablet Take 1 tablet (20 mg total) by mouth 2 (two) times daily. 60 tablet 0   glucose blood test strip Use as instructed 300 each 0   Insulin Pen Needle 32G X 4 MM MISC Used to give insulin injections twice daily. 100 each 11   insulin regular human CONCENTRATED (HUMULIN R) 500 UNIT/ML injection Inject 30 Units into the skin 3 (three) times daily with meals. 30 U BID     Insulin Syringe-Needle U-100 (GLOBAL INJECT EASE INSULIN SYR) 30G X 1/2" 1 ML MISC See admin instructions.     OXYGEN Inhale 2-4 L into the lungs 2 (two) times daily as needed (shortness of breath).      pregabalin (LYRICA) 300 MG capsule Take 1 capsule (300 mg total) by mouth 2 (two) times daily. For nerve pain. 180 capsule 0   ReliOn Ultra Thin Lancets 30G MISC Use as directed for blood sugar checks. 300 each 0   rOPINIRole (REQUIP) 1 MG tablet TAKE 1 TABLET BY MOUTH ONCE DAILY AT BEDTIME FOR  RESTLESS  LEGS 90 tablet 0   rosuvastatin (CRESTOR) 20 MG tablet Take 1 tablet (20 mg total) by mouth daily. For cholesterol. 90 tablet 3   traZODone (DESYREL) 100 MG tablet Take 200 mg by mouth at bedtime.      ULTICARE MINI PEN NEEDLES 31G X 6 MM MISC USE AS DIRECTED 3 TIMES DAILY 100 each 0   XIGDUO XR 5-500 MG TB24 Take 1 tablet by mouth every morning.     FLUoxetine (PROZAC) 40 MG capsule Take 40 mg by mouth daily. (Patient not taking: Reported on 03/05/2021)     mupirocin ointment (BACTROBAN) 2 % Apply 1 application topically 2 (two) times daily. Apply to the affected area 2 times a day (Patient not taking: No sig reported) 22 g 3   No current facility-administered medications on file prior to visit.    BP 134/82   Pulse 82   Temp 98.6 F (37 C) (Temporal)   Ht _0  (1.626 m)   Wt 248 lb (112.5 kg)   LMP  (LMP Unknown)   SpO2 96%   BMI 42.57 kg/m  Objective:    Physical Exam Cardiovascular:     Rate and Rhythm: Normal rate and regular rhythm.  Pulmonary:     Effort: Pulmonary effort is normal.     Breath sounds: Normal breath sounds.  Musculoskeletal:     Cervical back: Neck supple.  Skin:    General: Skin is warm and dry.  Psychiatric:        Mood and Affect: Mood normal.          Assessment & Plan:      This visit occurred during the SARS-CoV-2 public health emergency.  Safety protocols were in place, including screening questions prior to the visit, additional usage of staff PPE, and extensive cleaning of exam room while observing appropriate contact time as indicated for disinfecting solutions.

## 2021-03-05 NOTE — Patient Instructions (Addendum)
Start topiramate (Topamax) 50 mg tablets for headache prevention. Take 1 tablet by mouth at bedtime.   Please call and update me in 2 weeks regarding your headaches.   It was a pleasure to see you today!       Influenza (Flu) Vaccine (Inactivated or Recombinant): What You Need to Know 1. Why get vaccinated? Influenza vaccine can prevent influenza (flu). Flu is a contagious disease that spreads around the [place] every year, usually between October and May. Anyone can get the flu, but it is more dangerous for some people. Infants and young children, people 58 years and older, pregnant people, and people with certain health conditions or a weakenedimmune system are at greatest risk of flu complications. Pneumonia, bronchitis, sinus infections, and ear infections are examples of flu-related complications. If you have a medical condition, such as heartdisease, cancer, or diabetes, flu can make it worse. Flu can cause fever and chills, sore throat, muscle aches, fatigue, cough, headache, and runny or stuffy nose. Some people may have vomiting and diarrhea,though this is more common in children than adults. In an average year, thousands of people in the [place] die from flu, and many more are hospitalized. Flu vaccine prevents millions of illnessesand flu-related visits to the doctor each year. 2. Influenza vaccines CDC recommends everyone 6 months and older get vaccinated every flu season. Children 6 months through 41 years of age may need 2 doses during a single flu season. Everyone else needs only 1 dose each flu season. It takes about 2 weeks for protection to develop after vaccination. There are many flu viruses, and they are always changing. Each year a new flu vaccine is made to protect against the influenza viruses believed to be likely to cause disease in the upcoming flu season. Even when the vaccine doesn'texactly match these viruses, it may still provide some  protection. Influenza vaccine does not cause flu. Influenza vaccine may be given at the same time as other vaccines. 3. Talk with your health care provider Tell your vaccination provider if the person getting the vaccine: Has had an allergic reaction after a previous dose of influenza vaccine, or has any severe, life-threatening allergies Has ever had Guillain-Barr Syndrome (also called "GBS") In some cases, your health care provider may decide to postpone influenzavaccination until a future visit. Influenza vaccine can be administered at any time during pregnancy. People who are or will be pregnant during influenza season should receive inactivatedinfluenza vaccine. People with minor illnesses, such as a cold, may be vaccinated. People who are moderately or severely ill should usually wait until they recover beforegetting influenza vaccine. Your health care provider can give you more information. 4. Risks of a vaccine reaction Soreness, redness, and swelling where the shot is given, fever, muscle aches, and headache can happen after influenza vaccination. There may be a very small increased risk of Guillain-Barr Syndrome (GBS) after inactivated influenza vaccine (the flu shot). Young children who get the flu shot along with pneumococcal vaccine (PCV13) and/or DTaP vaccine at the same time might be slightly more likely to have a seizure caused by fever. Tell your health care provider if a child who isgetting flu vaccine has ever had a seizure. People sometimes faint after medical procedures, including vaccination. Tellyour provider if you feel dizzy or have vision changes or ringing in the ears. As with any medicine, there is a very remote chance of a vaccine causing asevere allergic reaction, other serious injury, or death. 5. What if there  is a serious problem? An allergic reaction could occur after the vaccinated person leaves the clinic. If you see signs of a severe allergic reaction (hives,  swelling of the face and throat, difficulty breathing, a fast heartbeat, dizziness, or weakness), call 9-1-1 and get the person to the nearest hospital. For other signs that concern you, call your health care provider. Adverse reactions should be reported to the Vaccine Adverse Event Reporting System (VAERS). Your health care provider will usually file this report, or you can do it yourself. Visit the VAERS website at www.vaers.SamedayNews.es or call 678-592-3576. VAERS is only for reporting reactions, and VAERS staff members do not give medical advice. 6. The National Vaccine Injury Compensation Program The [employer] Vaccine Injury Compensation Program (VICP) is a federal program that was created to compensate people who may have been injured by certain vaccines. Claims regarding alleged injury or death due to vaccination have a time limit for filing, which may be as short as two years. Visit the VICP website at GoldCloset.com.ee or call 2813559363 to learn about the program and about filing a claim. 7. How can I learn more? Ask your health care provider. Call your local or state health department. Visit the website of the Food and Drug Administration (FDA) for vaccine package inserts and additional information at TraderRating.uy. Contact the Centers for Disease Control and Prevention (CDC): Call 832-596-8178 (1-800-CDC-INFO) or Visit CDC's website at https://gibson.com/. Vaccine Information Statement Inactivated Influenza Vaccine (02/14/2020) This information is not intended to replace advice given to you by your health care provider. Make sure you discuss any questions you have with your healthcare provider. Document Revised: 04/02/2020 Document Reviewed: 04/02/2020 Elsevier Patient Education  2022 [employer].

## 2021-03-05 NOTE — Assessment & Plan Note (Signed)
With daily headaches, chronic since teenage years.  Discussed that she needed daily preventative headache treatment and that I was happy to start, but given her long medication list there could be a potential interaction with medication. She verbalized understanding and would like to proceed.  Rx for Topamax 50 mg HS sent to pharmacy. I asked that she also double check with the pharmacist to verify any interactions.   She will call me with an update in a few weeks.

## 2021-03-05 NOTE — Assessment & Plan Note (Signed)
Acute for the last 10 days.  UA today negative for leuks and nitrites, trace blood, 3+ glucose.   Urine culture pending. Await results.

## 2021-03-07 LAB — URINE CULTURE
MICRO NUMBER:: 12296955
SPECIMEN QUALITY:: ADEQUATE

## 2021-03-08 ENCOUNTER — Ambulatory Visit
Admission: RE | Admit: 2021-03-08 | Discharge: 2021-03-08 | Disposition: A | Discharge status: Home / Self Care | Payer: BC Managed Care – PPO | Source: Ambulatory Visit | Attending: Internal Medicine | Admitting: Internal Medicine

## 2021-03-08 ENCOUNTER — Other Ambulatory Visit: Payer: Self-pay

## 2021-03-08 DIAGNOSIS — K5730 Diverticulosis of large intestine without perforation or abscess without bleeding: Secondary | ICD-10-CM | POA: Diagnosis not present

## 2021-03-08 DIAGNOSIS — R519 Headache, unspecified: Secondary | ICD-10-CM | POA: Diagnosis not present

## 2021-03-08 DIAGNOSIS — Z853 Personal history of malignant neoplasm of breast: Secondary | ICD-10-CM | POA: Diagnosis not present

## 2021-03-08 DIAGNOSIS — K760 Fatty (change of) liver, not elsewhere classified: Secondary | ICD-10-CM | POA: Diagnosis not present

## 2021-03-08 DIAGNOSIS — R110 Nausea: Secondary | ICD-10-CM | POA: Diagnosis not present

## 2021-03-08 DIAGNOSIS — H53149 Visual discomfort, unspecified: Secondary | ICD-10-CM | POA: Diagnosis not present

## 2021-03-08 DIAGNOSIS — C50511 Malignant neoplasm of lower-outer quadrant of right female breast: Secondary | ICD-10-CM | POA: Insufficient documentation

## 2021-03-08 DIAGNOSIS — R2689 Other abnormalities of gait and mobility: Secondary | ICD-10-CM | POA: Diagnosis not present

## 2021-03-08 DIAGNOSIS — Z170 Estrogen receptor positive status [ER+]: Secondary | ICD-10-CM | POA: Insufficient documentation

## 2021-03-08 DIAGNOSIS — J432 Centrilobular emphysema: Secondary | ICD-10-CM | POA: Diagnosis not present

## 2021-03-08 DIAGNOSIS — I700 Atherosclerosis of aorta: Secondary | ICD-10-CM | POA: Diagnosis not present

## 2021-03-08 DIAGNOSIS — K828 Other specified diseases of gallbladder: Secondary | ICD-10-CM | POA: Diagnosis not present

## 2021-03-08 DIAGNOSIS — R262 Difficulty in walking, not elsewhere classified: Secondary | ICD-10-CM | POA: Diagnosis not present

## 2021-03-08 DIAGNOSIS — I2510 Atherosclerotic heart disease of native coronary artery without angina pectoris: Secondary | ICD-10-CM | POA: Diagnosis not present

## 2021-03-08 MED ORDER — IOHEXOL 350 MG/ML SOLN
100.0000 mL | Freq: Once | INTRAVENOUS | Status: AC | PRN
  Administered 2021-03-08: 100 mL via INTRAVENOUS

## 2021-03-09 ENCOUNTER — Other Ambulatory Visit: Payer: Self-pay | Admitting: Primary Care

## 2021-03-09 ENCOUNTER — Telehealth: Payer: Self-pay | Admitting: *Deleted

## 2021-03-09 ENCOUNTER — Telehealth: Payer: Self-pay | Admitting: Primary Care

## 2021-03-09 DIAGNOSIS — N3001 Acute cystitis with hematuria: Secondary | ICD-10-CM

## 2021-03-09 MED ORDER — SULFAMETHOXAZOLE-TRIMETHOPRIM 800-160 MG PO TABS: 1.0000 | ORAL_TABLET | Freq: Two times a day (BID) | ORAL | 0 refills | Status: DC

## 2021-03-09 NOTE — Telephone Encounter (Signed)
Patient called asking for return call regarding her CT Scan yesterday for results due to the pain that she is having in her pelvic area. The report is not yet read so I called radiology and spoke with Olivia Mackie who will make it a stat and I asked for a called report explaining the situation to her of patient discomfort and asking if she needs to go to ER or our office. Olivia Mackie will call me back with report once read. And I will relay results to doctor and his team. Patient informed of all of this and will await return call form our office

## 2021-03-09 NOTE — Telephone Encounter (Signed)
IMPRESSION: 1. Status post right mastectomy. No evidence of recurrent or metastatic disease in the chest, abdomen, or pelvis. 2. There is very extensive, fine ground-glass centrilobular nodularity most concentrated in the lung apices, similar to prior examination. In the setting of emphysema, these findings most likely reflect severe smoking-related respiratory bronchiolitis/desquamative interstitial pneumonitis. 3. There are numerous additional irregular and ground-glass nodules throughout the lungs, unchanged compared to prior, stable in comparison to remote prior examination dated 01/07/2016 and definitively benign sequelae of infection or inflammation. 4. Unchanged prominent pretracheal and right hilar nodes, likewise stable, benign and reactive. 5. Unchanged prominent retroperitoneal, iliac, pelvic sidewall, and inguinal lymph nodes, likewise stable, benign and reactive. 6. Hepatomegaly and hepatic steatosis. 7. Contracted gallbladder with mild gallbladder wall thickening. No gallstones or biliary ductal dilatation. 8. Small volume ascites in the low pelvis, nonspecific. 9. Coronary artery disease.   Aortic Atherosclerosis (ICD10-I70.0) and Emphysema (ICD10-J43.9).     Electronically Signed   By: Eddie Candle M.D.   On: 03/09/2021 10:19

## 2021-03-09 NOTE — Telephone Encounter (Signed)
Pt called to follow up on lab results  °

## 2021-03-09 NOTE — Telephone Encounter (Signed)
Called patient reviewed everything with her. She will try mediations and let us know if any issues. She states she is having 8/10 pain that has started after the UTI symptoms. Advised per Anda Kraft that if pain increases to go to ED for evaluation. If she does not have improvement of pain with antibiotics to give or office a call.

## 2021-03-09 NOTE — Telephone Encounter (Signed)
Pt does have an apt already scheduled tomorrow with Dr. B for these test results.

## 2021-03-09 NOTE — Telephone Encounter (Signed)
Patient called back states she can not take any abx by mouth. She states that she has to go to hospital to get medications in IV. Thought that you were aware of that. Does she need to go to ED to have meds given.

## 2021-03-09 NOTE — Telephone Encounter (Signed)
Pt called she said that the sulfamethoxazole-trimethoprim (BACTRIM DS) 800-160 MG tablet that were called in today is the wrong formulary. She would like a call back when this is fixed

## 2021-03-09 NOTE — Telephone Encounter (Signed)
I have never told her that she couldn't take oral antibiotics. Has anyone else?  She did have diarrhea about a month ago after being on several rounds of antibiotics. She tested negative for C-Diff.   I sent in a three day supply of Bactrim double strength antibiotics to her pharmacy today. I recommend she try these, take with food.

## 2021-03-09 NOTE — Telephone Encounter (Signed)
Lab results have been given to patient. She will call if no improvement.

## 2021-03-09 NOTE — Telephone Encounter (Signed)
CAll returned to patient and advised that Dr B will see her tomorrow to discuss CT results and that the report does not mention any fractures and that she needs to take the antibiotics her PCP ordered. She states that she has a call in to her PCP because she cannot take oral antibiotics as they upset her stomach and she is waiting to hear back from them. She states she will see Dr B tomorrow as scheduled

## 2021-03-09 NOTE — Telephone Encounter (Signed)
See result note.  

## 2021-03-09 NOTE — Telephone Encounter (Signed)
Dr. Sammie Bench just reviewed the chart- her pcp had a urine culture.- she was positive for a UTI? Is the pelvic pain coming from the UTI???

## 2021-03-10 ENCOUNTER — Other Ambulatory Visit: Payer: Self-pay

## 2021-03-10 ENCOUNTER — Encounter: Payer: Self-pay | Admitting: Internal Medicine

## 2021-03-10 ENCOUNTER — Telehealth: Payer: Self-pay | Admitting: Primary Care

## 2021-03-10 ENCOUNTER — Inpatient Hospital Stay: Payer: BC Managed Care – PPO | Admitting: Hospice and Palliative Medicine

## 2021-03-10 ENCOUNTER — Inpatient Hospital Stay: Payer: BC Managed Care – PPO | Attending: Internal Medicine

## 2021-03-10 ENCOUNTER — Inpatient Hospital Stay (HOSPITAL_BASED_OUTPATIENT_CLINIC_OR_DEPARTMENT_OTHER): Payer: BC Managed Care – PPO | Admitting: Internal Medicine

## 2021-03-10 DIAGNOSIS — Z9011 Acquired absence of right breast and nipple: Secondary | ICD-10-CM | POA: Diagnosis not present

## 2021-03-10 DIAGNOSIS — Z79811 Long term (current) use of aromatase inhibitors: Secondary | ICD-10-CM | POA: Insufficient documentation

## 2021-03-10 DIAGNOSIS — Z79899 Other long term (current) drug therapy: Secondary | ICD-10-CM | POA: Diagnosis not present

## 2021-03-10 DIAGNOSIS — Z170 Estrogen receptor positive status [ER+]: Secondary | ICD-10-CM | POA: Diagnosis not present

## 2021-03-10 DIAGNOSIS — I130 Hypertensive heart and chronic kidney disease with heart failure and stage 1 through stage 4 chronic kidney disease, or unspecified chronic kidney disease: Secondary | ICD-10-CM | POA: Diagnosis not present

## 2021-03-10 DIAGNOSIS — C50511 Malignant neoplasm of lower-outer quadrant of right female breast: Secondary | ICD-10-CM | POA: Diagnosis not present

## 2021-03-10 DIAGNOSIS — M2550 Pain in unspecified joint: Secondary | ICD-10-CM | POA: Diagnosis not present

## 2021-03-10 DIAGNOSIS — E1165 Type 2 diabetes mellitus with hyperglycemia: Secondary | ICD-10-CM | POA: Diagnosis not present

## 2021-03-10 DIAGNOSIS — E1142 Type 2 diabetes mellitus with diabetic polyneuropathy: Secondary | ICD-10-CM | POA: Diagnosis not present

## 2021-03-10 DIAGNOSIS — I5032 Chronic diastolic (congestive) heart failure: Secondary | ICD-10-CM | POA: Diagnosis not present

## 2021-03-10 DIAGNOSIS — N390 Urinary tract infection, site not specified: Secondary | ICD-10-CM | POA: Diagnosis not present

## 2021-03-10 DIAGNOSIS — L89623 Pressure ulcer of left heel, stage 3: Secondary | ICD-10-CM | POA: Diagnosis not present

## 2021-03-10 DIAGNOSIS — G8929 Other chronic pain: Secondary | ICD-10-CM | POA: Diagnosis not present

## 2021-03-10 DIAGNOSIS — Z794 Long term (current) use of insulin: Secondary | ICD-10-CM | POA: Diagnosis not present

## 2021-03-10 LAB — COMPREHENSIVE METABOLIC PANEL
ALT: 18 U/L (ref 0–44)
AST: 16 U/L (ref 15–41)
Albumin: 3.9 g/dL (ref 3.5–5.0)
Alkaline Phosphatase: 170 U/L — ABNORMAL HIGH (ref 38–126)
Anion gap: 8 (ref 5–15)
BUN: 23 mg/dL — ABNORMAL HIGH (ref 6–20)
CO2: 25 mmol/L (ref 22–32)
Calcium: 9 mg/dL (ref 8.9–10.3)
Chloride: 98 mmol/L (ref 98–111)
Creatinine, Ser: 1.05 mg/dL — ABNORMAL HIGH (ref 0.44–1.00)
GFR, Estimated: >60 mL/min (ref >60)
Glucose, Bld: 403 mg/dL — ABNORMAL HIGH (ref 70–99)
Potassium: 4.5 mmol/L (ref 3.5–5.1)
Sodium: 131 mmol/L — ABNORMAL LOW (ref 135–145)
Total Bilirubin: 0.8 mg/dL (ref 0.3–1.2)
Total Protein: 8.8 g/dL — ABNORMAL HIGH (ref 6.5–8.1)

## 2021-03-10 LAB — CBC WITH DIFFERENTIAL/PLATELET
Abs Immature Granulocytes: 0.06 10*3/uL (ref 0.00–0.07)
Basophils Absolute: 0.1 10*3/uL (ref 0.0–0.1)
Basophils Relative: 1 %
Eosinophils Absolute: 0.2 10*3/uL (ref 0.0–0.5)
Eosinophils Relative: 2 %
HCT: 39.8 % (ref 36.0–46.0)
Hemoglobin: 12.8 g/dL (ref 12.0–15.0)
Immature Granulocytes: 1 %
Lymphocytes Relative: 13 %
Lymphs Abs: 1 10*3/uL (ref 0.7–4.0)
MCH: 27.5 pg (ref 26.0–34.0)
MCHC: 32.2 g/dL (ref 30.0–36.0)
MCV: 85.4 fL (ref 80.0–100.0)
Monocytes Absolute: 0.5 10*3/uL (ref 0.1–1.0)
Monocytes Relative: 7 %
Neutro Abs: 5.7 10*3/uL (ref 1.7–7.7)
Neutrophils Relative %: 76 %
Platelets: 148 10*3/uL — ABNORMAL LOW (ref 150–400)
RBC: 4.66 MIL/uL (ref 3.87–5.11)
RDW: 18.1 % — ABNORMAL HIGH (ref 11.5–15.5)
WBC: 7.5 10*3/uL (ref 4.0–10.5)
nRBC: 0 % (ref 0.0–0.2)

## 2021-03-10 NOTE — Progress Notes (Signed)
[facility] NOTE  Patient Care Team: Pleas Koch, NP as PCP - General (Internal Medicine) Rosamaria Lints, MD (Inactive) as Referring Physician (Neurology) Alvester Chou, NP (Nurse Practitioner) Renato Shin, MD as Consulting Physician (Endocrinology) Theodore Demark, RN as Oncology Nurse Navigator Ronny Bacon, MD as Consulting Physician (General Surgery) [place], [place] as Registered Nurse Flora Lipps, MD as Consulting Physician (Pulmonary Disease) Sherlynn Stalls, MD as Consulting Physician (Ophthalmology) Dannielle Karvonen, RN as Cylinder Management  CHIEF COMPLAINTS/PURPOSE OF CONSULTATION: Breast cancer.  #  Oncology History Overview Note  #April 2021-2.5 cm right breast mass [incidental-CT scan chest]  # April 2021- RIGHT BREAST [facility]; s/p ER- 51-90%; PR- 51-90%; Her- 2-NEG. G-2. [Dr.Rodenberg]; s/p  Masectomy- pT2 pN0 (sn); Tamoxifen; Poor candidate for chemo;   # Discontinue tamoxifen early DEC 2021 [sec to nausea/vomiting diarrhea]  #Late December 2021- Anastrazole + Lupron 11.25 every 3 months  # DM- poorly controlled; Seizure disorder/Bipolar/TIA ; Diabetes PN- G-3 [cane/walker; falls]; COPD; OSA active smoker  # SURVIVORSHIP:   # GENETICS:   DIAGNOSIS: Right breast cancer  STAGE:    II     ;  GOALS: goal  CURRENT/MOST RECENT THERAPY : Anastrazole + Lupron 11.25 every 3 months     Carcinoma of lower-outer quadrant of right breast in female, estrogen receptor positive ([place])  11/12/2019 Initial Diagnosis   Carcinoma of lower-outer quadrant of right breast in female, estrogen receptor positive ([place])    HISTORY OF PRESENTING ILLNESS: Patient in wheelchair; accompanied by husband.  [name] 50 y.o.  female premenopausal patient with right-sided stage II ER/PR positive HER-2 negative breast cancer on adjuvant anastrozole +Lupron with multiple comorbidities-including poorly controlled diabetes/bipolar  disorder/active smoker/neuropathy is here for follow-up/review results of this surveillance CT scan.   Very anxious about the results of the CT scan.  Patient complaining of low pelvic pain dysuria foul-smelling urine-UA positive for infection; not started antibiotics yet.   Chronic shortness of breath chronic cough.  No diarrhea.  Chronic joint pains but no worse. Chronic neuropathy chronic back pain.  Not any worse.  Review of Systems  Constitutional:  Positive for malaise/fatigue. Negative for chills, diaphoresis, fever and weight loss.  HENT:  Negative for nosebleeds and sore throat.   Eyes:  Negative for double vision.  Respiratory:  Negative for cough, hemoptysis, sputum production, shortness of breath and wheezing.   Cardiovascular:  Negative for chest pain, palpitations, orthopnea and leg swelling.  Gastrointestinal:  Negative for abdominal pain, blood in stool, constipation, diarrhea, heartburn, melena, nausea and vomiting.  Genitourinary:  Negative for dysuria, frequency and urgency.  Musculoskeletal:  Positive for back pain, joint pain and myalgias.  Skin:  Negative for itching.  Neurological:  Positive for tingling. Negative for dizziness, focal weakness, weakness and headaches.  Endo/Heme/Allergies:  Does not bruise/bleed easily.  Psychiatric/Behavioral:  Negative for depression. The patient is not nervous/anxious and does not have insomnia.     MEDICAL HISTORY:  Past Medical History:  Diagnosis Date   Anxiety    takes Prozac daily   Anxiety    Aortic valve calcification    Asthma    Advair and Spirva daily   Asthma    Bipolar disorder ([place])    Breast cancer, right breast ([place]) 12/23/2019   CAD (coronary artery disease)    a. LHC 11/2013 done for CP/fluid retention: mild disease in prox LAD, mild-mod disease in mRCA, EF 60% with normal LVEDP. b. Normal nuc  03/2016.   Cancer ([place])    Cellulitis and abscess of trunk 03/12/2020   CHF (congestive heart failure) (HCC)     Chronic diastolic CHF (congestive heart failure) (HCC)    Chronic heart failure with preserved ejection fraction ([place]) 11/16/2018   CKD (chronic kidney disease), stage II    COPD (chronic obstructive pulmonary disease) (HCC)    a. nocturnal O2.   COPD (chronic obstructive pulmonary disease) (HCC)    Coronary artery disease    Decreased urine stream    Diabetes mellitus    Diabetes mellitus without complication (HCC)    Dyspnea    Elevated C-reactive protein (CRP) 01/29/2018   Elevated sedimentation rate 01/29/2018   Elevated serum hCG 01/19/2018   Family history of adverse reaction to anesthesia    mom gets nauseated   Foot pain, bilateral 05/22/2018   GERD (gastroesophageal reflux disease)    takes Pepcid daily   History of blood clots    left leg 3-34yr ago   Hyperlipidemia    Hypertension    Hypertriglyceridemia    Inguinal hernia, left 01/2015   Muscle spasm    Open wound of genital labia    Peripheral neuropathy    RBBB    Seizures ([place]    Sepsis ([place] 01/19/2018   Sinus tachycardia    a. persistent since 2009.   Smokers' cough ([place]    Status post mastectomy, right 01/14/2020   Stroke [facility] 1989   left sided weakness   TIA (transient ischemic attack)    Tobacco abuse    Vulvar abscess 01/23/2018    SURGICAL HISTORY: Past Surgical History:  Procedure Laterality Date   APPLICATION OF WOUND VAC Right 01/29/2020   Procedure: APPLICATION OF WOUND VAC;  Surgeon: RRonny Bacon MD;  Location: [facility];  Service: General;  Laterality: Right;   BREAST BIOPSY Right 11/06/2019   uKoreacore path pending venus clip   HERNIA REPAIR Left    INCISION AND DRAINAGE ABSCESS N/A 01/26/2018   Procedure: INCISION AND DEBRIDEMENT OF VULVAR NECROTIZING SOFT TISSUE INFECTION;  Surgeon: WGreer Pickerel MD;  [facility]  Service: General;  Laterality: N/A;   INCISION AND DRAINAGE PERIRECTAL ABSCESS N/A 01/22/2018   Procedure: IRRIGATION AND DEBRIDEMENT LABIAL/VULVAR AREA;  Surgeon: BCoralie Keens MD;  [facility]  Service: General;  Laterality: N/A;   INCISION AND DRAINAGE PERIRECTAL ABSCESS N/A 01/29/2018   Procedure: IRRIGATION AND DEBRIDEMENT VULVA;  Surgeon: HExcell Seltzer MD;  [facility]  Service: General;  Laterality: N/A;   INGUINAL HERNIA REPAIR Left 04/08/2015   Procedure: OPEN LEFT INGUINAL HERNIA REPAIR WITH MESH;  Surgeon: ARalene Ok MD;  [facility] [facility]  Service: General;  Laterality: Left;   INSERTION OF MESH Left 04/08/2015   Procedure: INSERTION OF MESH;  Surgeon: ARalene Ok MD;  [facility] [facility]  Service: General;  Laterality: Left;   LAPAROSCOPY     Endometriosis   LEFT HEART CATHETERIZATION WITH CORONARY ANGIOGRAM N/A 12/05/2013   Procedure: LEFT HEART CATHETERIZATION WITH CORONARY ANGIOGRAM;  Surgeon: JJettie Booze MD;  [facility];  Service: Cardiovascular;  Laterality: N/A;   MASTECTOMY Right    right kidney drained     SIMPLE MASTECTOMY WITH AXILLARY SENTINEL NODE BIOPSY Right 12/23/2019   Procedure: Right SIMPLE MASTECTOMY WITH AXILLARY SENTINEL NODE BIOPSY;  Surgeon: RRonny Bacon MD;  Location: [facility];  Service: General;  Laterality: Right;   TEE WITHOUT CARDIOVERSION N/A 11/28/2013   Procedure: TRANSESOPHAGEAL ECHOCARDIOGRAM (TEE);  Surgeon:  Thayer Headings, MD;  Location: [facility];  Service: Cardiovascular;  Laterality: N/A;   WOUND DEBRIDEMENT Right 01/29/2020   Procedure: DEBRIDEMENT WOUND, Excisional debridement skin, subcutaneous and muscle right chest wall;  Surgeon: Ronny Bacon, MD;  Location: [facility];  Service: General;  Laterality: Right;    SOCIAL HISTORY: Social History   Socioeconomic History   Marital status: Married    Spouse name: Not on file   Number of children: 2   Years of education: Not on file   Highest education level: Not on file  Occupational History   Not on file  Tobacco Use   Smoking status: Some Days    Packs/day: 0.25    Years: 36.00    Pack years: 9.00     Types: Cigarettes    Start date: 07/11/1981   Smokeless tobacco: Never   Tobacco comments:    1 ppd now  Vaping Use   Vaping Use: Some days  Substance and Sexual Activity   Alcohol use: No   Drug use: No   Sexual activity: Not Currently  Other Topics Concern   Not on file  Social History Narrative   ** Merged History Encounter **       Lives in [place]   Married but lives with Boyfriend - not legally separated   Disabled - for BiPolar, Seizure disorder, diabetes   Formerly worked at [employer] 1 ppd; no alcohol. 2 step sons; no biologic children.       Social Determinants of Radio broadcast assistant Strain: Not on file  Food Insecurity: No Food Insecurity   Worried About Charity fundraiser in the Last Year: Never true   Arboriculturist in the Last Year: Never true  Transportation Needs: Not on file  Physical Activity: Not on file  Stress: Not on file  Social Connections: Not on file  Intimate Partner Violence: Not on file    FAMILY HISTORY: Family History  Problem Relation Age of Onset   Venous thrombosis Brother    Other Brother        BRAIN TUMOR   Asthma Father    Diabetes Father    Coronary artery disease Mother    Hypertension Mother    Diabetes Mother    Breast cancer Mother 67   Asthma Sister    Diabetes type II Brother    Diabetes Brother     ALLERGIES:  is allergic to adhesive [tape], metoprolol, montelukast, morphine sulfate, penicillins, diltiazem, gabapentin, and midodrine.  MEDICATIONS:  Current Outpatient Medications  Medication Sig Dispense Refill   albuterol (VENTOLIN HFA) 108 (90 Base) MCG/ACT inhaler Inhale 2 puffs into the lungs every 6 (six) hours as needed for wheezing or shortness of breath.     ALPRAZolam (XANAX) 1 MG tablet Take 1 mg by mouth 4 (four) times daily as needed for anxiety.      anastrozole (ARIMIDEX) 1 MG tablet Take 1 tablet (1 mg total) by mouth daily. 30 tablet 3   aspirin EC 81 MG tablet Take 81 mg  by mouth daily before breakfast.     blood glucose meter kit and supplies Use up to four times daily as directed. (FOR ICD-10 E10.9, E11.9). 1 each 0   budesonide-formoterol (SYMBICORT) 80-4.5 MCG/ACT inhaler INHALE 2 PUFFS BY MOUTH EVERY 12 HOURS TO PREVENT COUGH OR WHEEZING *RINSE MOUTH AFTER EACH USE* 10.2 g 2   busPIRone (BUSPAR) 30 MG tablet Take 30 mg by  mouth 2 (two) times daily.     citalopram (CELEXA) 20 MG tablet Take 1 tablet (20 mg total) by mouth daily. 30 tablet 2   desvenlafaxine (PRISTIQ) 100 MG 24 hr tablet Take 100 mg by mouth daily.     Desvenlafaxine ER 100 MG TB24 Take 1 tablet by mouth every morning.     diclofenac Sodium (VOLTAREN) 1 % GEL Apply 2 g topically 3 (three) times daily as needed. For pain. 100 g 0   Empagliflozin-metFORMIN HCl ER 25-1000 MG TB24 Take 1 tablet by mouth daily with breakfast.     FLUoxetine (PROZAC) 40 MG capsule Take 40 mg by mouth daily.     glucose blood test strip Use as instructed 300 each 0   Insulin Pen Needle 32G X 4 MM MISC Used to give insulin injections twice daily. 100 each 11   insulin regular human CONCENTRATED (HUMULIN R) 500 UNIT/ML injection Inject 30 Units into the skin 3 (three) times daily with meals. 30 U BID     Insulin Syringe-Needle U-100 (GLOBAL INJECT EASE INSULIN SYR) 30G X 1/2" 1 ML MISC See admin instructions.     mupirocin ointment (BACTROBAN) 2 % Apply 1 application topically 2 (two) times daily. Apply to the affected area 2 times a day 22 g 3   OXYGEN Inhale 2-4 L into the lungs 2 (two) times daily as needed (shortness of breath).      pregabalin (LYRICA) 300 MG capsule Take 1 capsule (300 mg total) by mouth 2 (two) times daily. For nerve pain. 180 capsule 0   ReliOn Ultra Thin Lancets 30G MISC Use as directed for blood sugar checks. 300 each 0   rOPINIRole (REQUIP) 1 MG tablet TAKE 1 TABLET BY MOUTH ONCE DAILY AT BEDTIME FOR  RESTLESS  LEGS 90 tablet 0   rosuvastatin (CRESTOR) 20 MG tablet Take 1 tablet (20 mg total)  by mouth daily. For cholesterol. 90 tablet 3   topiramate (TOPAMAX) 50 MG tablet Take 1 tablet (50 mg total) by mouth at bedtime. For headache prevention. 30 tablet 0   traZODone (DESYREL) 100 MG tablet Take 200 mg by mouth at bedtime.      ULTICARE MINI PEN NEEDLES 31G X 6 MM MISC USE AS DIRECTED 3 TIMES DAILY 100 each 0   XIGDUO XR 5-500 MG TB24 Take 1 tablet by mouth every morning.     famotidine (PEPCID) 20 MG tablet Take 1 tablet (20 mg total) by mouth 2 (two) times daily. 60 tablet 0   sulfamethoxazole-trimethoprim (BACTRIM DS) 800-160 MG tablet Take 1 tablet by mouth 2 (two) times daily. For urinary tract infection. (Patient not taking: Reported on 03/10/2021) 6 tablet 0   No current facility-administered medications for this visit.      Marland Kitchen  PHYSICAL EXAMINATION: ECOG PERFORMANCE STATUS: 1 - Symptomatic but completely ambulatory  Vitals:   03/10/21 1033  BP: 121/63  Pulse: 66  Resp: 18  Temp: (!) 96.7 F (35.9 C)  SpO2: 98%   Filed Weights   03/10/21 1033  Weight: 244 lb (110.7 kg)    Physical Exam HENT:     Head: Normocephalic and atraumatic.     Mouth/Throat:     Pharynx: No oropharyngeal exudate.  Eyes:     Pupils: Pupils are equal, round, and reactive to light.  Cardiovascular:     Rate and Rhythm: Normal rate and regular rhythm.  Pulmonary:     Effort: No respiratory distress.     Breath sounds: No  wheezing.     Comments: Decreased breath sounds bilaterally the bases. Abdominal:     General: Bowel sounds are normal. There is no distension.     Palpations: Abdomen is soft. There is no mass.     Tenderness: There is no abdominal tenderness. There is no guarding or rebound.  Musculoskeletal:        General: No tenderness. Normal range of motion.     Cervical back: Normal range of motion and neck supple.  Skin:    General: Skin is warm.  Neurological:     Mental Status: She is alert and oriented to person, place, and time.  Psychiatric:        Mood and  Affect: Affect normal.     LABORATORY DATA:  I have reviewed the data as listed Lab Results  Component Value Date   WBC 7.5 03/10/2021   HGB 12.8 03/10/2021   HCT 39.8 03/10/2021   MCV 85.4 03/10/2021   PLT 148 (L) 03/10/2021   Recent Labs    05/17/20 0307 05/17/20 2336 01/13/21 1505 01/14/21 0617 02/03/21 1427 03/03/21 1657 03/10/21 1018  NA  --    < > 134*   < > 133* 135 131*  K  --    < > 3.6   < > 3.9 4.4 4.5  CL  --    < > 92*   < > 97* 99 98  CO2  --    < > 30   < > 27 29 25   GLUCOSE  --    < > 194*   < > 233* 295* 403*  BUN  --    < > 23*   < > 21* 24* 23*  CREATININE  --    < > 1.14*   < > 0.98 0.92 1.05*  CALCIUM  --    < > 9.0   < > 9.1 8.9 9.0  GFRNONAA  --    < > 58*   < > >60 >60 >60  PROT 8.2*   < > 8.1  --  8.6*  --  8.8*  ALBUMIN 3.3*   < > 3.5  --  3.7  --  3.9  AST 21   < > 22  --  20  --  16  ALT 21   < > 20  --  18  --  18  ALKPHOS 130*   < > 142*  --  153*  --  170*  BILITOT 1.0   < > 0.9  --  0.6  --  0.8  BILIDIR 0.2  --   --   --   --   --   --   IBILI 0.8  --   --   --   --   --   --    < > = values in this interval not displayed.       RADIOGRAPHIC STUDIES: I have personally reviewed the radiological images as listed and agreed with the findings in the report. CT HEAD WO CONTRAST (5MM)  Result Date: 03/03/2021 CLINICAL DATA:  Headache, dysuria, history of breast cancer EXAM: CT HEAD WITHOUT CONTRAST TECHNIQUE: Contiguous axial images were obtained from the base of the skull through the vertex without intravenous contrast. COMPARISON:  11/05/2020 FINDINGS: Brain: No acute infarct or hemorrhage. Lateral ventricles and midline structures are unremarkable. No acute extra-axial fluid collections. No mass effect. Vascular: No hyperdense vessel or unexpected calcification. Skull: Normal. Negative for fracture or focal lesion.  Sinuses/Orbits: No acute finding. Other: Stable metallic BB within the left frontotemporal scalp. IMPRESSION: 1. Stable head  CT, no acute intracranial process. Electronically Signed   By: Randa Ngo M.D.   On: 03/03/2021 21:59   CT CHEST ABDOMEN PELVIS W CONTRAST  Result Date: 03/09/2021 CLINICAL DATA:  History of right breast cancer, status post mastectomy, recent fall, pelvic pain EXAM: CT CHEST, ABDOMEN, AND PELVIS WITH CONTRAST TECHNIQUE: Multidetector CT imaging of the chest, abdomen and pelvis was performed following the standard protocol during bolus administration of intravenous contrast. CONTRAST:  128m OMNIPAQUE IOHEXOL 350 MG/ML SOLN, additional oral enteric contrast COMPARISON:  CT chest, 07/22/2020, CT abdomen pelvis, 12/20/2017, CT chest, 01/06/2006 FINDINGS: CT CHEST FINDINGS Cardiovascular: Aortic atherosclerosis. Normal heart size. Three-vessel coronary artery calcifications. No pericardial effusion. Mediastinum/Nodes: Unchanged prominent pretracheal and right hilar nodes (series 2, image 21). Thyroid gland, trachea, and esophagus demonstrate no significant findings. Lungs/Pleura: Mild centrilobular emphysema and diffuse bilateral bronchial wall thickening. There is very extensive, fine ground-glass centrilobular nodularity most concentrated in the lung apices, similar to prior examination. There are numerous additional irregular and ground-glass nodules throughout the lungs, unchanged compared to prior, the largest in the peripheral right upper lobe measuring 0.7 cm (series 3, image 63). These nodules are not significantly changed in comparison to remote prior CT dated 01/06/2006 and consistent with benign incidental sequelae of infection or inflammation. No pleural effusion or pneumothorax. Musculoskeletal: Status post right mastectomy. No chest wall mass or suspicious bone lesions identified. CT ABDOMEN PELVIS FINDINGS Hepatobiliary: No solid liver abnormality is seen. Hepatomegaly, maximum coronal span 22.5 cm. Hepatic steatosis. Contracted gallbladder with mild gallbladder wall thickening (series 2, image  66). No gallstones or biliary ductal dilatation. Pancreas: Unremarkable. No pancreatic ductal dilatation or surrounding inflammatory changes. Spleen: Normal in size without significant abnormality. Adrenals/Urinary Tract: Adrenal glands are unremarkable. Kidneys are normal, without renal calculi, solid lesion, or hydronephrosis. Bladder is unremarkable. Stomach/Bowel: Stomach is within normal limits. Appendix appears normal. No evidence of bowel wall thickening, distention, or inflammatory changes. Descending and sigmoid diverticulosis. Vascular/Lymphatic: Aortic atherosclerosis. Unchanged prominent retroperitoneal, iliac, pelvic sidewall, and inguinal lymph nodes. Reproductive: No mass or other abnormality. Other: No abdominal wall hernia or abnormality. Small volume ascites in the low pelvis (series 2, image 108). Musculoskeletal: No acute or significant osseous findings. IMPRESSION: 1. Status post right mastectomy. No evidence of recurrent or metastatic disease in the chest, abdomen, or pelvis. 2. There is very extensive, fine ground-glass centrilobular nodularity most concentrated in the lung apices, similar to prior examination. In the setting of emphysema, these findings most likely reflect severe smoking-related respiratory bronchiolitis/desquamative interstitial pneumonitis. 3. There are numerous additional irregular and ground-glass nodules throughout the lungs, unchanged compared to prior, stable in comparison to remote prior examination dated 01/07/2016 and definitively benign sequelae of infection or inflammation. 4. Unchanged prominent pretracheal and right hilar nodes, likewise stable, benign and reactive. 5. Unchanged prominent retroperitoneal, iliac, pelvic sidewall, and inguinal lymph nodes, likewise stable, benign and reactive. 6. Hepatomegaly and hepatic steatosis. 7. Contracted gallbladder with mild gallbladder wall thickening. No gallstones or biliary ductal dilatation. 8. Small volume ascites  in the low pelvis, nonspecific. 9. Coronary artery disease. Aortic Atherosclerosis (ICD10-I70.0) and Emphysema (ICD10-J43.9). Electronically Signed   By: AEddie CandleM.D.   On: 03/09/2021 10:19   DG Chest Portable 1 View  Result Date: 03/03/2021 CLINICAL DATA:  Headache, dysuria, history of breast cancer EXAM: PORTABLE CHEST 1 VIEW COMPARISON:  01/20/2021 FINDINGS: Single frontal view of the  chest demonstrates a stable cardiac silhouette. Stable chronic interstitial changes throughout the lungs compatible with scarring. No airspace disease, effusion, or pneumothorax. No acute bony abnormality. IMPRESSION: 1. Stable chronic interstitial lung disease.  No acute process. Electronically Signed   By: Randa Ngo M.D.   On: 03/03/2021 21:51     ASSESSMENT & PLAN:   Carcinoma of lower-outer quadrant of right breast in female, estrogen receptor positive ([place]) # T2N0- ER/PR-positive; premenopausal currently on Anastrazole [stopped tam sec to poor tolerance].  Continue Lupron Depo 11.25 mg every 3 months.  #Continue anastrozole + Lupron for now.  Tolerating fairly well. AUG 2022- CT C/A/P- NED.   #Dysuria/burning pain during urination-positive for UTI; recommend start Bactrim/as prescribed by PCP.   # Headache/migraines-awaiting awaiting neurology evaluation do not suspect for malignancy; discussed with PCP. STABLE   #Depression/anxiety/bipolar-currently on BuSpar/Celexa/trazodone STABLE.-As per psychiatry.  #Chronic respiratory failure-CHF/COPD; STABLE; however I reviewed the significant findings noted on the CT scan [bronchiolitis from smoking]; discussed at length regarding quitting smoking/recommend talking to PCP regarding smoking cessation aids.  #Poorly controlled diabetes-on insulin; FBG AM- 403 overall stable;  Followed. Dr.O'Connell endocrinology.  #Peripheral neuropathy grade 2-3; patient goes on with a walker/cane at baseline- STABLE.    #Genetics: Discuss  DISPOSITION:  # ring the  bell today # follow up in 1 month- MD; labs- cbc/cmp/ca-27-29Dr.B    All questions were answered. The patient knows to call the clinic with any problems, questions or concerns.    Cammie Sickle, MD 03/10/2021 12:09 PM

## 2021-03-10 NOTE — Telephone Encounter (Signed)
No, she does not need to come in on 09/01. I do see that she is scheduled for 09/21, I would like to see her then so we can follow-up on her headaches.

## 2021-03-10 NOTE — Progress Notes (Signed)
Has a current UTI. Antibiotic was sent in yesterday and has not started it yet. Not feeling too well today. Headaches.

## 2021-03-10 NOTE — Telephone Encounter (Signed)
Please advise 

## 2021-03-10 NOTE — Telephone Encounter (Signed)
Pt wanted to know if she has to come in for her office visit on 9/1 being that she had just seen the provider on 8/26

## 2021-03-10 NOTE — Telephone Encounter (Signed)
Noted  

## 2021-03-10 NOTE — Assessment & Plan Note (Addendum)
#   T2N0- ER/PR-positive; premenopausal currently on Anastrazole [stopped tam sec to poor tolerance].  Continue Lupron Depo 11.25 mg every 3 months.  #Continue anastrozole + Lupron for now.  Tolerating fairly well. AUG 2022- CT C/A/P- NED.   #Dysuria/burning pain during urination-positive for UTI; recommend start Bactrim/as prescribed by PCP.   # Headache/migraines-awaiting awaiting neurology evaluation do not suspect for malignancy; discussed with PCP. STABLE   #Depression/anxiety/bipolar-currently on BuSpar/Celexa/trazodone STABLE.-As per psychiatry.  #Chronic respiratory failure-CHF/COPD; STABLE; however I reviewed the significant findings noted on the CT scan [bronchiolitis from smoking]; discussed at length regarding quitting smoking/recommend talking to PCP regarding smoking cessation aids.  #Poorly controlled diabetes-on insulin; FBG AM- 403 overall stable;  Followed. Dr.O'Connell endocrinology.  #Peripheral neuropathy grade 2-3; patient goes on with a walker/cane at baseline- STABLE.    #Genetics: Discuss  DISPOSITION:  # ring the bell today # follow up in 1 month- MD; labs- cbc/cmp/ca-27-29Dr.B

## 2021-03-11 ENCOUNTER — Encounter: Payer: Self-pay | Admitting: Internal Medicine

## 2021-03-11 ENCOUNTER — Inpatient Hospital Stay: Payer: Medicare Other | Admitting: Primary Care

## 2021-03-11 LAB — CANCER ANTIGEN 27.29: CA 27.29: 35.3 U/mL (ref 0.0–38.6)

## 2021-03-11 NOTE — Telephone Encounter (Signed)
Called patient let know she did not need to keep appointment. She will keep next appointment in late September. I have confirmed time and date while on the phone.

## 2021-03-12 DIAGNOSIS — E113513 Type 2 diabetes mellitus with proliferative diabetic retinopathy with macular edema, bilateral: Secondary | ICD-10-CM | POA: Diagnosis not present

## 2021-03-16 ENCOUNTER — Telehealth: Payer: Self-pay | Admitting: Primary Care

## 2021-03-16 DIAGNOSIS — E1122 Type 2 diabetes mellitus with diabetic chronic kidney disease: Secondary | ICD-10-CM | POA: Diagnosis not present

## 2021-03-16 DIAGNOSIS — R519 Headache, unspecified: Secondary | ICD-10-CM | POA: Diagnosis not present

## 2021-03-16 DIAGNOSIS — N182 Chronic kidney disease, stage 2 (mild): Secondary | ICD-10-CM | POA: Diagnosis not present

## 2021-03-16 DIAGNOSIS — G40909 Epilepsy, unspecified, not intractable, without status epilepticus: Secondary | ICD-10-CM

## 2021-03-16 DIAGNOSIS — I69334 Monoplegia of upper limb following cerebral infarction affecting left non-dominant side: Secondary | ICD-10-CM | POA: Diagnosis not present

## 2021-03-16 DIAGNOSIS — Z7982 Long term (current) use of aspirin: Secondary | ICD-10-CM | POA: Insufficient documentation

## 2021-03-16 DIAGNOSIS — I2510 Atherosclerotic heart disease of native coronary artery without angina pectoris: Secondary | ICD-10-CM | POA: Diagnosis not present

## 2021-03-16 DIAGNOSIS — R300 Dysuria: Secondary | ICD-10-CM

## 2021-03-16 DIAGNOSIS — E785 Hyperlipidemia, unspecified: Secondary | ICD-10-CM | POA: Diagnosis not present

## 2021-03-16 DIAGNOSIS — I130 Hypertensive heart and chronic kidney disease with heart failure and stage 1 through stage 4 chronic kidney disease, or unspecified chronic kidney disease: Secondary | ICD-10-CM | POA: Diagnosis not present

## 2021-03-16 DIAGNOSIS — E1151 Type 2 diabetes mellitus with diabetic peripheral angiopathy without gangrene: Secondary | ICD-10-CM | POA: Diagnosis not present

## 2021-03-16 DIAGNOSIS — Z6841 Body Mass Index (BMI) 40.0 and over, adult: Secondary | ICD-10-CM | POA: Diagnosis not present

## 2021-03-16 DIAGNOSIS — J449 Chronic obstructive pulmonary disease, unspecified: Secondary | ICD-10-CM | POA: Diagnosis not present

## 2021-03-16 DIAGNOSIS — F319 Bipolar disorder, unspecified: Secondary | ICD-10-CM | POA: Diagnosis not present

## 2021-03-16 DIAGNOSIS — I5032 Chronic diastolic (congestive) heart failure: Secondary | ICD-10-CM | POA: Diagnosis not present

## 2021-03-16 DIAGNOSIS — L89622 Pressure ulcer of left heel, stage 2: Secondary | ICD-10-CM | POA: Diagnosis not present

## 2021-03-16 DIAGNOSIS — Z853 Personal history of malignant neoplasm of breast: Secondary | ICD-10-CM | POA: Insufficient documentation

## 2021-03-16 DIAGNOSIS — Z794 Long term (current) use of insulin: Secondary | ICD-10-CM | POA: Diagnosis not present

## 2021-03-16 DIAGNOSIS — Z170 Estrogen receptor positive status [ER+]: Secondary | ICD-10-CM | POA: Diagnosis not present

## 2021-03-16 DIAGNOSIS — E11622 Type 2 diabetes mellitus with other skin ulcer: Secondary | ICD-10-CM | POA: Diagnosis not present

## 2021-03-16 DIAGNOSIS — F17210 Nicotine dependence, cigarettes, uncomplicated: Secondary | ICD-10-CM | POA: Diagnosis not present

## 2021-03-16 DIAGNOSIS — G4733 Obstructive sleep apnea (adult) (pediatric): Secondary | ICD-10-CM | POA: Diagnosis not present

## 2021-03-16 DIAGNOSIS — K219 Gastro-esophageal reflux disease without esophagitis: Secondary | ICD-10-CM | POA: Diagnosis not present

## 2021-03-16 DIAGNOSIS — F419 Anxiety disorder, unspecified: Secondary | ICD-10-CM | POA: Diagnosis not present

## 2021-03-16 DIAGNOSIS — E1142 Type 2 diabetes mellitus with diabetic polyneuropathy: Secondary | ICD-10-CM | POA: Diagnosis not present

## 2021-03-16 DIAGNOSIS — C50511 Malignant neoplasm of lower-outer quadrant of right female breast: Secondary | ICD-10-CM | POA: Diagnosis not present

## 2021-03-16 DIAGNOSIS — E1165 Type 2 diabetes mellitus with hyperglycemia: Secondary | ICD-10-CM | POA: Diagnosis not present

## 2021-03-16 NOTE — Telephone Encounter (Signed)
[facility] called pt told them that she is still having uti symptoms and also wound on left heal

## 2021-03-17 ENCOUNTER — Other Ambulatory Visit: Payer: Self-pay

## 2021-03-17 ENCOUNTER — Encounter: Payer: BC Managed Care – PPO | Attending: Internal Medicine | Admitting: Internal Medicine

## 2021-03-17 DIAGNOSIS — E11622 Type 2 diabetes mellitus with other skin ulcer: Secondary | ICD-10-CM | POA: Diagnosis not present

## 2021-03-17 DIAGNOSIS — Z8673 Personal history of transient ischemic attack (TIA), and cerebral infarction without residual deficits: Secondary | ICD-10-CM | POA: Diagnosis not present

## 2021-03-17 DIAGNOSIS — Z833 Family history of diabetes mellitus: Secondary | ICD-10-CM | POA: Insufficient documentation

## 2021-03-17 DIAGNOSIS — L03116 Cellulitis of left lower limb: Secondary | ICD-10-CM | POA: Insufficient documentation

## 2021-03-17 DIAGNOSIS — Z820 Family history of epilepsy and other diseases of the nervous system: Secondary | ICD-10-CM | POA: Insufficient documentation

## 2021-03-17 DIAGNOSIS — I110 Hypertensive heart disease with heart failure: Secondary | ICD-10-CM | POA: Insufficient documentation

## 2021-03-17 DIAGNOSIS — R569 Unspecified convulsions: Secondary | ICD-10-CM | POA: Insufficient documentation

## 2021-03-17 DIAGNOSIS — I872 Venous insufficiency (chronic) (peripheral): Secondary | ICD-10-CM | POA: Diagnosis not present

## 2021-03-17 DIAGNOSIS — I5032 Chronic diastolic (congestive) heart failure: Secondary | ICD-10-CM | POA: Diagnosis not present

## 2021-03-17 DIAGNOSIS — D0581 Other specified type of carcinoma in situ of right breast: Secondary | ICD-10-CM | POA: Insufficient documentation

## 2021-03-17 DIAGNOSIS — Z823 Family history of stroke: Secondary | ICD-10-CM | POA: Diagnosis not present

## 2021-03-17 DIAGNOSIS — Z8249 Family history of ischemic heart disease and other diseases of the circulatory system: Secondary | ICD-10-CM | POA: Diagnosis not present

## 2021-03-17 DIAGNOSIS — Z86718 Personal history of other venous thrombosis and embolism: Secondary | ICD-10-CM | POA: Diagnosis not present

## 2021-03-17 DIAGNOSIS — F17200 Nicotine dependence, unspecified, uncomplicated: Secondary | ICD-10-CM | POA: Insufficient documentation

## 2021-03-17 DIAGNOSIS — E1142 Type 2 diabetes mellitus with diabetic polyneuropathy: Secondary | ICD-10-CM | POA: Diagnosis not present

## 2021-03-17 DIAGNOSIS — J449 Chronic obstructive pulmonary disease, unspecified: Secondary | ICD-10-CM | POA: Insufficient documentation

## 2021-03-17 DIAGNOSIS — L89623 Pressure ulcer of left heel, stage 3: Secondary | ICD-10-CM | POA: Insufficient documentation

## 2021-03-17 DIAGNOSIS — Z880 Allergy status to penicillin: Secondary | ICD-10-CM | POA: Insufficient documentation

## 2021-03-17 NOTE — Progress Notes (Signed)
TOM, RAGSDALE ([id number]) Visit Report for 03/17/2021 Arrival Information Details Patient Name: [name], [name] Date of Service: 03/17/2021 10:00 AM Medical Record Number: [id number] Patient Account Number: [id number] Date of Birth/Sex: August 31, 1970 (50 y.o. F) Treating RN: Dolan Amen Primary Care Wendolyn Raso: Alma Friendly Other Clinician: Referring Srihitha Tagliaferri: Alma Friendly Treating Cathaleen Korol/Extender: Yaakov Guthrie in Treatment: 6 Visit Information History Since Last Visit Pain Present Now: No Patient Arrived: Ambulatory Arrival Time: 10:11 Accompanied By: husband Transfer Assistance: None Patient Has Alerts: Yes Patient Alerts: 325 aspirin Type II Diabetic Electronic Signature(s) Signed: 03/17/2021 4:14:25 PM By: Dolan Amen RN Entered By: Dolan Amen on 03/17/2021 10:11:42 Marandola, [name] ([id number]) -------------------------------------------------------------------------------- Clinic Level of Care Assessment Details Patient Name: [name] Date of Service: 03/17/2021 10:00 AM Medical Record Number: [id number] Patient Account Number: [id number] Date of Birth/Sex: Jul 16, 1970 (50 y.o. F) Treating RN: Dolan Amen Primary Care Tyshea Imel: Alma Friendly Other Clinician: Referring Felice Deem: Alma Friendly Treating Dixie Jafri/Extender: Yaakov Guthrie in Treatment: 6 Clinic Level of Care Assessment Items TOOL 4 Quantity Score X - Use when only an EandM is performed on FOLLOW-UP visit 1 0 ASSESSMENTS - Nursing Assessment / Reassessment X - Reassessment of Co-morbidities (includes updates in patient status) 1 10 X- 1 5 Reassessment of Adherence to Treatment Plan ASSESSMENTS - Wound and Skin Assessment / Reassessment X - Simple Wound Assessment / Reassessment - one wound 1 5 []  - 0 Complex Wound Assessment / Reassessment - multiple wounds []  - 0 Dermatologic / Skin Assessment (not related to wound area) ASSESSMENTS - Focused Assessment []  -  Circumferential Edema Measurements - multi extremities 0 []  - 0 Nutritional Assessment / Counseling / Intervention []  - 0 Lower Extremity Assessment (monofilament, tuning fork, pulses) []  - 0 Peripheral Arterial Disease Assessment (using hand held doppler) ASSESSMENTS - Ostomy and/or Continence Assessment and Care []  - Incontinence Assessment and Management 0 []  - 0 Ostomy Care Assessment and Management (repouching, etc.) PROCESS - Coordination of Care X - Simple Patient / Family Education for ongoing care 1 15 []  - 0 Complex (extensive) Patient / Family Education for ongoing care []  - 0 Staff obtains Programmer, systems, Records, Test Results / Process Orders []  - 0 Staff telephones HHA, Nursing Homes / Clarify orders / etc []  - 0 Routine Transfer to another Facility (non-emergent condition) []  - 0 Routine Hospital Admission (non-emergent condition) []  - 0 New Admissions / Biomedical engineer / Ordering NPWT, Apligraf, etc. []  - 0 Emergency Hospital Admission (emergent condition) X- 1 10 Simple Discharge Coordination []  - 0 Complex (extensive) Discharge Coordination PROCESS - Special Needs []  - Pediatric / Minor Patient Management 0 []  - 0 Isolation Patient Management []  - 0 Hearing / Language / Visual special needs []  - 0 Assessment of Community assistance (transportation, D/C planning, etc.) []  - 0 Additional assistance / Altered mentation []  - 0 Support Surface(s) Assessment (bed, cushion, seat, etc.) INTERVENTIONS - Wound Cleansing / Measurement Maiello, Zehra ([id number]) X- 1 5 Simple Wound Cleansing - one wound []  - 0 Complex Wound Cleansing - multiple wounds X- 1 5 Wound Imaging (photographs - any number of wounds) []  - 0 Wound Tracing (instead of photographs) X- 1 5 Simple Wound Measurement - one wound []  - 0 Complex Wound Measurement - multiple wounds INTERVENTIONS - Wound Dressings []  - Small Wound Dressing one or multiple wounds 0 X- 1 15 Medium Wound  Dressing one or multiple wounds []  - 0 Large Wound Dressing one or multiple wounds []  - 0 Application of Medications -  topical []  - 0 Application of Medications - injection INTERVENTIONS - Miscellaneous []  - External ear exam 0 []  - 0 Specimen Collection (cultures, biopsies, blood, body fluids, etc.) []  - 0 Specimen(s) / Culture(s) sent or taken to Lab for analysis []  - 0 Patient Transfer (multiple staff / Harrel Lemon Lift / Similar devices) []  - 0 Simple Staple / Suture removal (25 or less) []  - 0 Complex Staple / Suture removal (26 or more) []  - 0 Hypo / Hyperglycemic Management (close monitor of Blood Glucose) []  - 0 Ankle / Brachial Index (ABI) - do not check if billed separately X- 1 5 Vital Signs Has the patient been seen at the hospital within the last three years: Yes Total Score: 80 Level Of Care: New/Established - Level 3 Electronic Signature(s) Signed: 03/17/2021 4:14:25 PM By: Dolan Amen RN Entered By: Dolan Amen on 03/17/2021 10:59:31 [name] ([id number]) -------------------------------------------------------------------------------- Lower Extremity Assessment Details Patient Name: [name] Date of Service: 03/17/2021 10:00 AM Medical Record Number: [id number] Patient Account Number: [id number] Date of Birth/Sex: 07/09/1971 (50 y.o. F) Treating RN: Dolan Amen Primary Care Sulaiman Imbert: Alma Friendly Other Clinician: Referring Zina Pitzer: Alma Friendly Treating Allin Frix/Extender: Yaakov Guthrie in Treatment: 6 Edema Assessment Assessed: [Left: Yes] [Right: No] Edema: [Left: N] [Right: o] Vascular Assessment Pulses: Dorsalis Pedis Palpable: [Left:Yes] Electronic Signature(s) Signed: 03/17/2021 4:14:25 PM By: Dolan Amen RN Entered By: Dolan Amen on 03/17/2021 10:27:30 Calleros, [name] ([id number]) -------------------------------------------------------------------------------- Multi Wound Chart Details Patient Name:  [name] Date of Service: 03/17/2021 10:00 AM Medical Record Number: [id number] Patient Account Number: [id number] Date of Birth/Sex: 1970/11/14 (50 y.o. F) Treating RN: Dolan Amen Primary Care Prisilla Kocsis: Alma Friendly Other Clinician: Referring Conna Terada: Alma Friendly Treating Lauree Yurick/Extender: Yaakov Guthrie in Treatment: 6 Vital Signs Height(in): 19 Pulse(bpm): 17 Weight(lbs): 242 Blood Pressure(mmHg): 141/56 Body Mass Index(BMI): 42 Temperature(F): 97.6 Respiratory Rate(breaths/min): 18 Photos: [N/A:N/A] Wound Location: Left Calcaneus N/A N/A Wounding Event: Blister N/A N/A Primary Etiology: Pressure Ulcer N/A N/A Comorbid History: Asthma, Chronic Obstructive N/A N/A Pulmonary Disease (COPD), Sleep Apnea, Arrhythmia, Congestive Heart Failure, Coronary Artery Disease, Hypertension, Type II Diabetes, Neuropathy, Received Chemotherapy, Received Radiation Date Acquired: 01/13/2021 N/A N/A Weeks of Treatment: 6 N/A N/A Wound Status: Open N/A N/A Measurements L x W x D (cm) 1.2x1x0.1 N/A N/A Area (cm) : 0.942 N/A N/A Volume (cm) : 0.094 N/A N/A % Reduction in Area: 90.00% N/A N/A % Reduction in Volume: 90.00% N/A N/A Classification: Category/Stage III N/A N/A Exudate Amount: Small N/A N/A Exudate Type: Sanguinous N/A N/A Exudate Color: red N/A N/A Granulation Amount: Large (67-100%) N/A N/A Granulation Quality: Red, Pink N/A N/A Necrotic Amount: None Present (0%) N/A N/A Exposed Structures: Fat Layer (Subcutaneous Tissue): N/A N/A Yes Fascia: No Tendon: No Muscle: No Joint: No Bone: No Epithelialization: Large (67-100%) N/A N/A Treatment Notes Electronic Signature(s) Signed: 03/17/2021 11:04:40 AM By: Kalman Shan DO Entered By: Kalman Shan on 03/17/2021 11:01:16 Vivas, [name] ([id number]) Chistochina, [name] ([id number]) -------------------------------------------------------------------------------- Multi-Disciplinary Care Plan  Details Patient Name: [name] Date of Service: 03/17/2021 10:00 AM Medical Record Number: [id number] Patient Account Number: [id number] Date of Birth/Sex: Feb 02, 1971 (50 y.o. F) Treating RN: Dolan Amen Primary Care Nikko Goldwire: Alma Friendly Other Clinician: Referring Takoda Siedlecki: Alma Friendly Treating Essa Malachi/Extender: Yaakov Guthrie in Treatment: 6 Active Inactive Wound/Skin Impairment Nursing Diagnoses: Impaired tissue integrity Goals: Patient/caregiver will verbalize understanding of skin care regimen Date Initiated: 02/02/2021 Date Inactivated: 03/17/2021 Target Resolution Date: 02/02/2021 Goal Status: Met Ulcer/skin breakdown will have a  volume reduction of 30% by week 4 Date Initiated: 02/02/2021 Date Inactivated: 03/17/2021 Target Resolution Date: 03/05/2021 Goal Status: Met Ulcer/skin breakdown will have a volume reduction of 50% by week 8 Date Initiated: 02/02/2021 Date Inactivated: 03/17/2021 Target Resolution Date: 04/05/2021 Goal Status: Met Ulcer/skin breakdown will have a volume reduction of 80% by week 12 Date Initiated: 02/02/2021 Date Inactivated: 03/17/2021 Target Resolution Date: 05/05/2021 Goal Status: Met Ulcer/skin breakdown will heal within 14 weeks Date Initiated: 02/02/2021 Target Resolution Date: 06/05/2021 Goal Status: Active Interventions: Assess patient/caregiver ability to obtain necessary supplies Assess patient/caregiver ability to perform ulcer/skin care regimen upon admission and as needed Assess ulceration(s) every visit Provide education on ulcer and skin care Treatment Activities: Referred to DME Timisha Mondry for dressing supplies : 02/02/2021 Skin care regimen initiated : 02/02/2021 Notes: Electronic Signature(s) Signed: 03/17/2021 4:14:25 PM By: Dolan Amen RN Entered By: Dolan Amen on 03/17/2021 10:57:37 Engelhard, [name] ([id number]) -------------------------------------------------------------------------------- Pain  Assessment Details Patient Name: [name] Date of Service: 03/17/2021 10:00 AM Medical Record Number: [id number] Patient Account Number: [id number] Date of Birth/Sex: 1970/10/05 (50 y.o. F) Treating RN: Dolan Amen Primary Care Pranathi Winfree: Alma Friendly Other Clinician: Referring Pavan Bring: Alma Friendly Treating Kyngston Pickelsimer/Extender: Yaakov Guthrie in Treatment: 6 Active Problems Location of Pain Severity and Description of Pain Patient Has Paino No Site Locations Rate the pain. Current Pain Level: 0 Pain Management and Medication Current Pain Management: Electronic Signature(s) Signed: 03/17/2021 4:14:25 PM By: Dolan Amen RN Entered By: Dolan Amen on 03/17/2021 10:14:29 [name] ([id number]) -------------------------------------------------------------------------------- Patient/Caregiver Education Details Patient Name: [name] Date of Service: 03/17/2021 10:00 AM Medical Record Number: [id number] Patient Account Number: [id number] Date of Birth/Gender: 22-Jun-1971 (50 y.o. F) Treating RN: Dolan Amen Primary Care Physician: Alma Friendly Other Clinician: Referring Physician: Alma Friendly Treating Physician/Extender: Yaakov Guthrie in Treatment: 6 Education Assessment Education Provided To: Patient Education Topics Provided Wound/Skin Impairment: Methods: Explain/Verbal Responses: State content correctly Electronic Signature(s) Signed: 03/17/2021 4:14:25 PM By: Dolan Amen RN Entered By: Dolan Amen on 03/17/2021 10:59:43 Gorden, Retaj ([id number]) -------------------------------------------------------------------------------- Wound Assessment Details Patient Name: [name] Date of Service: 03/17/2021 10:00 AM Medical Record Number: [id number] Patient Account Number: [id number] Date of Birth/Sex: [birth year] (50 y.o. F) Treating RN: Dolan Amen Primary Care Sahith Nurse: Alma Friendly Other  Clinician: Referring Kalinda Romaniello: Alma Friendly Treating Emmalyn Hinson/Extender: Yaakov Guthrie in Treatment: 6 Wound Status Wound Number: 9 Primary Pressure Ulcer Etiology: Wound Location: Left Calcaneus Wound Open Wounding Event: Blister Status: Date Acquired: 01/13/2021 Comorbid Asthma, Chronic Obstructive Pulmonary Disease (COPD), Weeks Of Treatment: 6 History: Sleep Apnea, Arrhythmia, Congestive Heart Failure, Clustered Wound: No Coronary Artery Disease, Hypertension, Type II Diabetes, Neuropathy, Received Chemotherapy, Received Radiation Photos Wound Measurements Length: (cm) 1.2 Width: (cm) 1 Depth: (cm) 0.1 Area: (cm) 0.942 Volume: (cm) 0.094 % Reduction in Area: 90% % Reduction in Volume: 90% Epithelialization: Large (67-100%) Tunneling: No Undermining: No Wound Description Classification: Category/Stage III Exudate Amount: Small Exudate Type: Sanguinous Exudate Color: red Foul Odor After Cleansing: No Slough/Fibrino No Wound Bed Granulation Amount: Large (67-100%) Exposed Structure Granulation Quality: Red, Pink Fascia Exposed: No Necrotic Amount: None Present (0%) Fat Layer (Subcutaneous Tissue) Exposed: Yes Tendon Exposed: No Muscle Exposed: No Joint Exposed: No Bone Exposed: No Electronic Signature(s) Signed: 03/17/2021 4:14:25 PM By: Dolan Amen RN Entered By: Dolan Amen on 03/17/2021 10:25:19 Eversley, [name] ([id number]) -------------------------------------------------------------------------------- Vitals Details Patient Name: [name] Date of Service: 03/17/2021 10:00 AM Medical Record Number: [id number] Patient Account Number: [id number]  Date of Birth/Sex: 06/05/1971 (50 y.o. F) Treating RN: Dolan Amen Primary Care Bayle Calvo: Alma Friendly Other Clinician: Referring Xianna Siverling: Alma Friendly Treating Soundra Lampley/Extender: Yaakov Guthrie in Treatment: 6 Vital Signs Time Taken: 10:11 Temperature (F): 97.6 Height  (in): 64 Pulse (bpm): 70 Weight (lbs): 242 Respiratory Rate (breaths/min): 18 Body Mass Index (BMI): 41.5 Blood Pressure (mmHg): 141/56 Reference Range: 80 - 120 mg / dl Electronic Signature(s) Signed: 03/17/2021 4:14:25 PM By: Dolan Amen RN Entered By: Dolan Amen on 03/17/2021 10:14:10

## 2021-03-17 NOTE — Progress Notes (Signed)
[name], [name] Visit Report for 03/17/2021 Chief Complaint Document Details Patient Name: [name], [name] Date of Service: 03/17/2021 10:00 AM Medical Record Number: [id number] Patient Account Number: [id number] Date of Birth/Sex: Nov 30, 1970 (50 y.o. F) Treating RN: Dolan Amen Primary Care Provider: Alma Friendly Other Clinician: Referring Provider: Alma Friendly Treating Provider/Extender: Yaakov Guthrie in Treatment: 6 Information Obtained from: Patient Chief Complaint Left Heel Ulcer Electronic Signature(s) Signed: 03/17/2021 11:04:40 AM By: Kalman Shan DO Entered By: Kalman Shan on 03/17/2021 11:01:25 Schrupp, [name] -------------------------------------------------------------------------------- HPI Details Patient Name: [name] Date of Service: 03/17/2021 10:00 AM Medical Record Number: [id number] Patient Account Number: [id number] Date of Birth/Sex: February 15, 1971 (50 y.o. F) Treating RN: Dolan Amen Primary Care Provider: Alma Friendly Other Clinician: Referring Provider: Alma Friendly Treating Provider/Extender: Yaakov Guthrie in Treatment: 6 History of Present Illness HPI Description: ADMISSION 06/19/2019 Patient is a 50 year old woman who is accompanied by her husband. She has severe diabetic peripheral neuropathy which is left her minimally ambulatory. She spends most of the time in a wheelchair. She also has a history of chronic lower extremity edema history of left leg DVT. She states that on 2 separate occasions roughly a month ago she fell with lacerations on the bilateral anterior tibial areas. She was seen by primary care on 06/04/2019 diagnosed with cellulitis of the left leg put on Bactrim DS. Also noted to have hand wounds with a question of referral to hand surgery. She was also sent to our clinic at the same time. The patient's husband is doing the dressings. He has been using silver alginate that  was supplied by home health. He is done a really good job the area on the left leg is actually healed only a small superficial area on the right Somewhere in the in the last 2 weeks she gives a bizarre history of going to wash nail polish off the fingers of both hands. She apparently did not realize that her son had put Clorox in the vinegar jar that they used to clean the countertop. She ended up burning her hands. The patient has a wound on the PIP of the left third finger, PIP of the right second finger and the lateral part of the right third finger Past medical history includes COPD, obstructive sleep apnea on nocturnal O2, type 2 diabetes with peripheral neuropathy, retinopathy, chronic diastolic heart failure coronary artery disease, severe diabetic neuropathy type 2 diabetes apparently diagnosed at age 50 on insulin, COPD, continued tobacco abuse, left leg deep DVT, hypertension, TIAs, seizure disorders, asthma The patient is actually had arterial studies in February of this year. ABIs bilaterally were 1.02 and TBI's bilaterally at 0.6. Waveforms were triphasic and biphasic. She was not felt to have significant arterial disease. 12/23; patient readmitted the clinic last week. She has predominantly venous insufficiency wounds on the right medial lower leg. She also had open areas on the fingers of both hands which happened in a very bizarre way. She has almost complete healing on the right medial calf. The area on the left third finger is healed. She still has 2 open wounds on the medial aspect of the PIP of the right second finger and the dorsal aspect of the right PIP of the third finger. Of these 2 wounds the PIP of the third finger wound is painful and deeper 12/30; the areas on her right medial calf are closed. She likely has some degree of chronic venous insufficiency and lymphedema. Also very xerotic skin which I have recommended  a moisturizing. I did not bring up the issue of  compression stockings The areas we are still looking out are on the PIP of the right third finger and the medial part of the PIP of the right second finger. The second finger area is just about closed. The problem is over the PIP of the right third finger. This does not look infected and she is completing her antibiotics from last week. However this is a deep wound at the base of this there is either tendon or periosteum. She she has some healthy granulation but we are not seeing that close over the bottom part of the wound. We are probably going to need to immobilize the finger somewhat so that she does not continuously pull the tissue apart 07/17/2019; right medial calf remains closed and the right second finger is closed. The sole problem here is an area over the PIP of the third finger. These wounds were initially advertised as burn injuries that happened in a very bizarre way [please see my initial discussion on this]. I gave her doxycycline last week because of some erythema around the wound however she did not tolerate this very well because of nausea. She stopped this within the last day or 2. we have been using silver collagen 1/13; the only wound we are looking at is the right third finger over the PIP. We are using endoform. X-ray ordered last time showed no underlying bone abnormalities 1/20; right third finger over the PIP. Still debris at the surface we have been using endoform. We are making some improvements in surface area and volume but still requiring debridement. 1/27; right third finger over the PIP. Still debris on the surface of the wound and maceration. We have been using endoform. This wound initially went to the periosteum and it certainly is off that but is certainly stalled over the last 2 or 3 weeks 2/10; right third finger over the PIP. We switch to Iodoflex last time but she says this caused extreme erythema and blistering and they changed back to endoform. This is a  wound that originally went to the periosteum. This is improved since her admission but really it is stalled. She missed her appointment last week but she states she has been using endoform. She complains of increasing pain in the finger that is making it difficult for her to sleep at times 2/17; right third finger over the PIP. This is a difficult wound small punched out area. Culture I did last week showed methicillin sensitive staph aureus however she is allergic to penicillin [widespread rash as a child] she has not had cephalosporins. Multiple drug interactions with quinolones and I was concerned about trimethoprim sulfamethoxazole in somebody with ACE inhibitors and spironolactone. I prescribed doxycycline. This was 2 days ago. She comes in today without the antibiotic saying that it caused mental status changes fatigue and diarrhea the last time she was on this. 2/24; right third finger over the PIP. She is tolerating the doxycycline has another 3 days left. This is for the area on the PIP of the third finger on her right. She arrives in clinic today with a thick raised eschar over the PIP of the left third finger. I talked to her about this. The exact reason is unclear. She does smoke but denies it could be a burn injury. Her husband thinks this may be trauma pushing her wheelchair but the depth of this makes this somewhat unusual. She does not appear to have any  blood flow issues in either arm Readmission: 06/29/2020 upon evaluation today patient appears to be doing somewhat poorly in regard to her left lower extremity. She has a couple open areas here which appear to be venous in nature. One of them is a more recent trauma but nonetheless both are very superficial. She does have a lot of lymphedema/stasis type weeping from the wound opening which I think is preventing her from healing. She has been trying to work on this [name], [name] ([id number]) on her own but unfortunately just is not  getting the results she needs as far as getting the swelling under control to allow these wounds to heal. Currently the patient tells me as well that she has been undergoing treatment for breast cancer on the right. I think she has a left mastectomy as well. She also has COPD. Her husband is actually taking very good care of her and doing what he can at this point in time. Patient does have hospice coming out to see her as well. Readmission: 02/02/21 second married issues that she was having with cellulitis. She tells me currently that she was in the hospital for several days and subsequently it was during that time that she developed a wound on her left heel. Initially this was thought to just be a blister and she was told not to pop it. Subsequently upon getting home she notes that this started to worsen an opening drain. That's when she decided to contact the wound center as we've seen her previously and taking care of ulcerations for her. Upon evaluation today the patient actually showed signs of what appears to be a pressure injury stage III to the left heel. There does not appear to be any significant depth although there's just one spot that goes down to the subcutaneous layer were there some Slough noted there is a lot of blistered skin and callous tissue that is going to need to be removed help this to heal properly. The patient voiced understanding. Otherwise her medical history per above really has not changed intricately at this point. The patient was started on Doxy cycling two days ago 02/08/2021 upon evaluation today patient's wound actually showing signs of excellent improvement. I do not see any evidence of infection which is great news and after the debridement this actually seems to have done great over the past week. I am extremely pleased with what I see today. 9/7; patient presents for follow-up. Its been over a month since patient was last seen. She has been using Xeroform to the  wound daily. She denies signs of infection. She has no issues or complaints today. Electronic Signature(s) Signed: 03/17/2021 11:04:40 AM By: Kalman Shan DO Entered By: Kalman Shan on 03/17/2021 11:01:56 [name], [name] ([id number]) -------------------------------------------------------------------------------- Physical Exam Details Patient Name: [name] Date of Service: 03/17/2021 10:00 AM Medical Record Number: [id number] Patient Account Number: [id number] Date of Birth/Sex: 08-14-1970 (50 y.o. F) Treating RN: Dolan Amen Primary Care Provider: Alma Friendly Other Clinician: Referring Provider: Alma Friendly Treating Provider/Extender: Yaakov Guthrie in Treatment: 6 Constitutional . Cardiovascular . Psychiatric . Notes Left calcaneus: Open wound with granulation tissue present. Circumferential callus. No signs of infection. Appears well-healing. Electronic Signature(s) Signed: 03/17/2021 11:04:40 AM By: Kalman Shan DO Entered By: Kalman Shan on 03/17/2021 11:02:58 [name] ([id number]) -------------------------------------------------------------------------------- Physician Orders Details Patient Name: [name] Date of Service: 03/17/2021 10:00 AM Medical Record Number: [id number] Patient Account Number: [id number] Date of Birth/Sex: Jun 14, 1971 (50 y.o. F) Treating RN: Dolan Amen  Primary Care Provider: Alma Friendly Other Clinician: Referring Provider: Alma Friendly Treating Provider/Extender: Yaakov Guthrie in Treatment: 6 Verbal / Phone Orders: No Diagnosis Coding Follow-up Appointments o Return Appointment in 3 weeks. Bathing/ Shower/ Hygiene o May shower; gently cleanse wound with antibacterial soap, rinse and pat dry prior to dressing wounds Wound Treatment Wound #9 - Calcaneus Wound Laterality: Left Cleanser: Soap and Water 1 x Per Day/30 Days Discharge Instructions: Gently cleanse wound with  antibacterial soap, rinse and pat dry prior to dressing wounds Primary Dressing: Xeroform 4x4-HBD (in/in) 1 x Per Day/30 Days Discharge Instructions: Apply Xeroform 4x4-HBD (in/in) as directed Secondary Dressing: Gauze 1 x Per Day/30 Days Discharge Instructions: Apply dry gauze to cover xeroform Secured With: Coban Cohesive Bandage 4x5 (yds) Stretched 1 x Per Day/30 Days Discharge Instructions: Apply Coban as directed. Electronic Signature(s) Signed: 03/17/2021 11:04:40 AM By: Kalman Shan DO Signed: 03/17/2021 4:14:25 PM By: Dolan Amen RN Entered By: Dolan Amen on 03/17/2021 11:00:10 [name] ([id number]) -------------------------------------------------------------------------------- Problem List Details Patient Name: [name] Date of Service: 03/17/2021 10:00 AM Medical Record Number: [id number] Patient Account Number: [id number] Date of Birth/Sex: [birth year] (50 y.o. F) Treating RN: Dolan Amen Primary Care Provider: Alma Friendly Other Clinician: Referring Provider: Alma Friendly Treating Provider/Extender: Yaakov Guthrie in Treatment: 6 Active Problems ICD-10 Encounter Code Description Active Date MDM Diagnosis [phone] Pressure ulcer of left heel, stage 3 02/02/2021 No Yes L03.116 Cellulitis of left lower limb 02/02/2021 No Yes I87.2 Venous insufficiency (chronic) (peripheral) 02/02/2021 No Yes D05.81 Other specified type of carcinoma in situ of right breast 02/02/2021 No Yes J44.9 Chronic obstructive pulmonary disease, unspecified 02/02/2021 No Yes Inactive Problems Resolved Problems Electronic Signature(s) Signed: 03/17/2021 11:04:40 AM By: Kalman Shan DO Entered By: Kalman Shan on 03/17/2021 11:01:11 [name], [name] ([id number]) -------------------------------------------------------------------------------- Progress Note Details Patient Name: [name] Date of Service: 03/17/2021 10:00 AM Medical Record Number:  [id number] Patient Account Number: [id number] Date of Birth/Sex: 1971-03-04 (50 y.o. F) Treating RN: Dolan Amen Primary Care Provider: Alma Friendly Other Clinician: Referring Provider: Alma Friendly Treating Provider/Extender: Yaakov Guthrie in Treatment: 6 Subjective Chief Complaint Information obtained from Patient Left Heel Ulcer History of Present Illness (HPI) ADMISSION 06/19/2019 Patient is a 50 year old woman who is accompanied by her husband. She has severe diabetic peripheral neuropathy which is left her minimally ambulatory. She spends most of the time in a wheelchair. She also has a history of chronic lower extremity edema history of left leg DVT. She states that on 2 separate occasions roughly a month ago she fell with lacerations on the bilateral anterior tibial areas. She was seen by primary care on 06/04/2019 diagnosed with cellulitis of the left leg put on Bactrim DS. Also noted to have hand wounds with a question of referral to hand surgery. She was also sent to our clinic at the same time. The patient's husband is doing the dressings. He has been using silver alginate that was supplied by home health. He is done a really good job the area on the left leg is actually healed only a small superficial area on the right Somewhere in the in the last 2 weeks she gives a bizarre history of going to wash nail polish off the fingers of both hands. She apparently did not realize that her son had put Clorox in the vinegar jar that they used to clean the countertop. She ended up burning her hands. The patient has a wound on the PIP of the left third finger,  PIP of the right second finger and the lateral part of the right third finger Past medical history includes COPD, obstructive sleep apnea on nocturnal O2, type 2 diabetes with peripheral neuropathy, retinopathy, chronic diastolic heart failure coronary artery disease, severe diabetic neuropathy type 2 diabetes  apparently diagnosed at age 41 on insulin, COPD, continued tobacco abuse, left leg deep DVT, hypertension, TIAs, seizure disorders, asthma The patient is actually had arterial studies in February of this year. ABIs bilaterally were 1.02 and TBI's bilaterally at 0.6. Waveforms were triphasic and biphasic. She was not felt to have significant arterial disease. 12/23; patient readmitted the clinic last week. She has predominantly venous insufficiency wounds on the right medial lower leg. She also had open areas on the fingers of both hands which happened in a very bizarre way. She has almost complete healing on the right medial calf. The area on the left third finger is healed. She still has 2 open wounds on the medial aspect of the PIP of the right second finger and the dorsal aspect of the right PIP of the third finger. Of these 2 wounds the PIP of the third finger wound is painful and deeper 12/30; the areas on her right medial calf are closed. She likely has some degree of chronic venous insufficiency and lymphedema. Also very xerotic skin which I have recommended a moisturizing. I did not bring up the issue of compression stockings The areas we are still looking out are on the PIP of the right third finger and the medial part of the PIP of the right second finger. The second finger area is just about closed. The problem is over the PIP of the right third finger. This does not look infected and she is completing her antibiotics from last week. However this is a deep wound at the base of this there is either tendon or periosteum. She she has some healthy granulation but we are not seeing that close over the bottom part of the wound. We are probably going to need to immobilize the finger somewhat so that she does not continuously pull the tissue apart 07/17/2019; right medial calf remains closed and the right second finger is closed. The sole problem here is an area over the PIP of the third  finger. These wounds were initially advertised as burn injuries that happened in a very bizarre way [please see my initial discussion on this]. I gave her doxycycline last week because of some erythema around the wound however she did not tolerate this very well because of nausea. She stopped this within the last day or 2. we have been using silver collagen 1/13; the only wound we are looking at is the right third finger over the PIP. We are using endoform. X-ray ordered last time showed no underlying bone abnormalities 1/20; right third finger over the PIP. Still debris at the surface we have been using endoform. We are making some improvements in surface area and volume but still requiring debridement. 1/27; right third finger over the PIP. Still debris on the surface of the wound and maceration. We have been using endoform. This wound initially went to the periosteum and it certainly is off that but is certainly stalled over the last 2 or 3 weeks 2/10; right third finger over the PIP. We switch to Iodoflex last time but she says this caused extreme erythema and blistering and they changed back to endoform. This is a wound that originally went to the periosteum. This is improved since  her admission but really it is stalled. She missed her appointment last week but she states she has been using endoform. She complains of increasing pain in the finger that is making it difficult for her to sleep at times 2/17; right third finger over the PIP. This is a difficult wound small punched out area. Culture I did last week showed methicillin sensitive staph aureus however she is allergic to penicillin [widespread rash as a child] she has not had cephalosporins. Multiple drug interactions with quinolones and I was concerned about trimethoprim sulfamethoxazole in somebody with ACE inhibitors and spironolactone. I prescribed doxycycline. This was 2 days ago. She comes in today without the antibiotic saying  that it caused mental status changes fatigue and diarrhea the last time she was on this. 2/24; right third finger over the PIP. She is tolerating the doxycycline has another 3 days left. This is for the area on the PIP of the third finger on her right. She arrives in clinic today with a thick raised eschar over the PIP of the left third finger. I talked to her about this. The exact reason is unclear. She does smoke but denies it could be a burn injury. Her husband thinks this may be trauma pushing her wheelchair but the depth of this makes this somewhat unusual. She does not appear to have any blood flow issues in either arm [name], [name] Readmission: 06/29/2020 upon evaluation today patient appears to be doing somewhat poorly in regard to her left lower extremity. She has a couple open areas here which appear to be venous in nature. One of them is a more recent trauma but nonetheless both are very superficial. She does have a lot of lymphedema/stasis type weeping from the wound opening which I think is preventing her from healing. She has been trying to work on this on her own but unfortunately just is not getting the results she needs as far as getting the swelling under control to allow these wounds to heal. Currently the patient tells me as well that she has been undergoing treatment for breast cancer on the right. I think she has a left mastectomy as well. She also has COPD. Her husband is actually taking very good care of her and doing what he can at this point in time. Patient does have hospice coming out to see her as well. Readmission: 02/02/21 second married issues that she was having with cellulitis. She tells me currently that she was in the hospital for several days and subsequently it was during that time that she developed a wound on her left heel. Initially this was thought to just be a blister and she was told not to pop it. Subsequently upon getting home she notes  that this started to worsen an opening drain. That's when she decided to contact the wound center as we've seen her previously and taking care of ulcerations for her. Upon evaluation today the patient actually showed signs of what appears to be a pressure injury stage III to the left heel. There does not appear to be any significant depth although there's just one spot that goes down to the subcutaneous layer were there some Slough noted there is a lot of blistered skin and callous tissue that is going to need to be removed help this to heal properly. The patient voiced understanding. Otherwise her medical history per above really has not changed intricately at this point. The patient was started on Doxy cycling two days ago 02/08/2021  upon evaluation today patient's wound actually showing signs of excellent improvement. I do not see any evidence of infection which is great news and after the debridement this actually seems to have done great over the past week. I am extremely pleased with what I see today. 9/7; patient presents for follow-up. Its been over a month since patient was last seen. She has been using Xeroform to the wound daily. She denies signs of infection. She has no issues or complaints today. Patient History Information obtained from Patient. Family History Cancer - Siblings, Diabetes - Siblings,Father,Mother, Heart Disease - Siblings,Father,Mother, Hypertension - Mother,Father,Siblings, Lung Disease - Mother, Seizures - Mother, Stroke - Mother,Siblings,Father, No family history of Hereditary Spherocytosis, Kidney Disease, Thyroid Problems, Tuberculosis. Social History Current every day smoker - 40 years, Marital Status - Married, Alcohol Use - Never, Drug Use - No History, Caffeine Use - Daily - coffee, soda. Medical History Eyes Denies history of Cataracts, Glaucoma, Optic Neuritis Ear/Nose/Mouth/Throat Denies history of Chronic sinus problems/congestion, Middle ear  problems Hematologic/Lymphatic Denies history of Anemia, Hemophilia, Human Immunodeficiency Virus, Lymphedema, Sickle Cell Disease Respiratory Patient has history of Asthma, Chronic Obstructive Pulmonary Disease (COPD), Sleep Apnea Cardiovascular Patient has history of Arrhythmia - sinus tachycardia, Congestive Heart Failure, Coronary Artery Disease, Hypertension Gastrointestinal Denies history of Cirrhosis , Colitis, Crohn s, Hepatitis A, Hepatitis B, Hepatitis C Endocrine Patient has history of Type II Diabetes Genitourinary Denies history of End Stage Renal Disease Immunological Denies history of Lupus Erythematosus, Raynaud s, Scleroderma Integumentary (Skin) Denies history of History of Burn, History of pressure wounds Musculoskeletal Denies history of Gout, Rheumatoid Arthritis, Osteoarthritis, Osteomyelitis Neurologic Patient has history of Neuropathy - legs, feet and hands Denies history of Dementia, Quadriplegia, Paraplegia, Seizure Disorder Oncologic Patient has history of Received Chemotherapy, Received Radiation Psychiatric Denies history of Anorexia/bulimia, Confinement Anxiety Medical And Surgical History Notes Neurologic Stroke 2020 Left weakness Oncologic Breast cancer-right mastectomy [name], [name] Objective Constitutional Vitals Time Taken: 10:11 AM, Height: 64 in, Weight: 242 lbs, BMI: 41.5, Temperature: 97.6 F, Pulse: 70 bpm, Respiratory Rate: 18 breaths/min, Blood Pressure: 141/56 mmHg. General Notes: Left calcaneus: Open wound with granulation tissue present. Circumferential callus. No signs of infection. Appears well-healing. Integumentary (Hair, Skin) Wound #9 status is Open. Original cause of wound was Blister. The date acquired was: 01/13/2021. The wound has been in treatment 6 weeks. The wound is located on the Left Calcaneus. The wound measures 1.2cm length x 1cm width x 0.1cm depth; 0.942cm^2 area and 0.094cm^3 volume. There is Fat  Layer (Subcutaneous Tissue) exposed. There is no tunneling or undermining noted. There is a small amount of sanguinous drainage noted. There is large (67-100%) red, pink granulation within the wound bed. There is no necrotic tissue within the wound bed. 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 She follows closely with Margarita Grizzle Patient's wound has shown improvement in size and appearance and's last clinic visit. She has been using Xeroform I recommended continuing this. We also discussed aggressive offloading to the area to help with wound healing. Patient would like to follow-up in 3 weeks and I think this is reasonable. I recommended she call with any questions or concerns and she can be sooner if needed. Plan Follow-up Appointments: Return Appointment in 3 weeks. Bathing/ Shower/ Hygiene: May shower; gently cleanse wound with  antibacterial soap, rinse and pat dry prior to dressing wounds WOUND #9: - Calcaneus Wound Laterality: Left Cleanser: Soap and Water 1 x Per Day/30 Days Discharge Instructions: Gently cleanse wound with antibacterial soap, rinse and pat dry prior to dressing wounds Primary Dressing: Xeroform 4x4-HBD (in/in) 1 x Per Day/30 Days Discharge Instructions: Apply Xeroform 4x4-HBD (in/in) as directed Secondary Dressing: Gauze 1 x Per Day/30 Days Discharge Instructions: Apply dry gauze to cover xeroform Secured With: Coban Cohesive Bandage 4x5 (yds) Stretched 1 x Per Day/30 Days Discharge Instructions: Apply Coban as directed. 1. Continue Xeroform 2. Aggressive offloading 3. Follow-up in 3 weeks Electronic Signature(s) Signed: 03/17/2021 11:04:40 AM By: Belva Bertin, [name] Entered By: Kalman Shan on  03/17/2021 11:04:00 [name], [name] -------------------------------------------------------------------------------- ROS/PFSH Details Patient Name: [name] Date of Service: 03/17/2021 10:00 AM Medical Record Number: [id number] Patient Account Number: [id number] Date of Birth/Sex: 1971/01/21 (50 y.o. F) Treating RN: Dolan Amen Primary Care Provider: Alma Friendly Other Clinician: Referring Provider: Alma Friendly Treating Provider/Extender: Yaakov Guthrie in Treatment: 6 Information Obtained From Patient Eyes Medical History: Negative for: Cataracts; Glaucoma; Optic Neuritis Ear/Nose/Mouth/Throat Medical History: Negative for: Chronic sinus problems/congestion; Middle ear problems Hematologic/Lymphatic Medical History: Negative for: Anemia; Hemophilia; Human Immunodeficiency Virus; Lymphedema; Sickle Cell Disease Respiratory Medical History: Positive for: Asthma; Chronic Obstructive Pulmonary Disease (COPD); Sleep Apnea Cardiovascular Medical History: Positive for: Arrhythmia - sinus tachycardia; Congestive Heart Failure; Coronary Artery Disease; Hypertension Gastrointestinal Medical History: Negative for: Cirrhosis ; Colitis; Crohnos; Hepatitis A; Hepatitis B; Hepatitis C Endocrine Medical History: Positive for: Type II Diabetes Time with diabetes: 30 years Treated with: Insulin, Oral agents Blood sugar tested every day: Yes Tested : daily Blood sugar testing results: Breakfast: 225 Genitourinary Medical History: Negative for: End Stage Renal Disease Immunological Medical History: Negative for: Lupus Erythematosus; Raynaudos; Scleroderma Integumentary (Skin) Medical History: Negative for: History of Burn; History of pressure wounds [name], [name] Musculoskeletal Medical History: Negative for: Gout; Rheumatoid Arthritis; Osteoarthritis; Osteomyelitis Neurologic Medical History: Positive for: Neuropathy - legs, feet  and hands Negative for: Dementia; Quadriplegia; Paraplegia; Seizure Disorder Past Medical History Notes: Stroke 2020 Left weakness Oncologic Medical History: Positive for: Received Chemotherapy; Received Radiation Past Medical History Notes: Breast cancer-right mastectomy Psychiatric Medical History: Negative for: Anorexia/bulimia; Confinement Anxiety Immunizations Pneumococcal Vaccine: Received Pneumococcal Vaccination: Yes Received Pneumococcal Vaccination On or After 60th Birthday: No Implantable Devices None Family and Social History Cancer: Yes - Siblings; Diabetes: Yes - Siblings,Father,Mother; Heart Disease: Yes - Siblings,Father,Mother; Hereditary Spherocytosis: No; Hypertension: Yes - Mother,Father,Siblings; Kidney Disease: No; Lung Disease: Yes - Mother; Seizures: Yes - Mother; Stroke: Yes - Mother,Siblings,Father; Thyroid Problems: No; Tuberculosis: No; Current every day smoker - 40 years; Marital Status - Married; Alcohol Use: Never; Drug Use: No History; Caffeine Use: Daily - coffee, soda; Financial Concerns: No; Food, Clothing or Shelter Needs: No; Support System Lacking: No; Transportation Concerns: No Electronic Signature(s) Signed: 03/17/2021 11:04:40 AM By: Kalman Shan DO Signed: 03/17/2021 4:14:25 PM By: Dolan Amen RN Entered By: Kalman Shan on 03/17/2021 11:02:03 [name], [name] -------------------------------------------------------------------------------- SuperBill Details Patient Name: [name] Date of Service: 03/17/2021 Medical Record Number: [id number] Patient Account Number: [id number] Date of Birth/Sex: 09/28/1970 (50 y.o. 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 J44.9 Chronic obstructive pulmonary disease, unspecified Facility Procedures CPT4 Code: [id number] Description: [id number] - WOUND CARE VISIT-LEV 3 EST PT Modifier: Quantity: 1 Physician Procedures CPT4 Code: [id number] Description: [id number] - WC PHYS LEVEL 3 - EST PT Modifier: Quantity: 1 CPT4 Code: Description: ICD-10 Diagnosis Description L89.623 Pressure ulcer of left heel, stage 3 I87.2 Venous insufficiency (chronic) (peripheral) J44.9 Chronic obstructive pulmonary disease, unspecified Modifier: Quantity: Electronic Signature(s) Signed: 03/17/2021 11:04:40 AM By: Kalman Shan DO Entered By: Kalman Shan on 03/17/2021 11:04:19

## 2021-03-18 ENCOUNTER — Encounter: Payer: Self-pay | Admitting: Internal Medicine

## 2021-03-18 ENCOUNTER — Ambulatory Visit (INDEPENDENT_AMBULATORY_CARE_PROVIDER_SITE_OTHER): Payer: BC Managed Care – PPO | Admitting: Podiatry

## 2021-03-18 ENCOUNTER — Encounter: Payer: Self-pay | Admitting: Podiatry

## 2021-03-18 DIAGNOSIS — N183 Chronic kidney disease, stage 3 unspecified: Secondary | ICD-10-CM | POA: Diagnosis not present

## 2021-03-18 DIAGNOSIS — I739 Peripheral vascular disease, unspecified: Secondary | ICD-10-CM | POA: Diagnosis not present

## 2021-03-18 DIAGNOSIS — B351 Tinea unguium: Secondary | ICD-10-CM | POA: Diagnosis not present

## 2021-03-18 DIAGNOSIS — M2010 Hallux valgus (acquired), unspecified foot: Secondary | ICD-10-CM | POA: Diagnosis not present

## 2021-03-18 DIAGNOSIS — M79675 Pain in left toe(s): Secondary | ICD-10-CM

## 2021-03-18 DIAGNOSIS — E1122 Type 2 diabetes mellitus with diabetic chronic kidney disease: Secondary | ICD-10-CM | POA: Diagnosis not present

## 2021-03-18 DIAGNOSIS — E0843 Diabetes mellitus due to underlying condition with diabetic autonomic (poly)neuropathy: Secondary | ICD-10-CM | POA: Diagnosis not present

## 2021-03-18 DIAGNOSIS — E1142 Type 2 diabetes mellitus with diabetic polyneuropathy: Secondary | ICD-10-CM

## 2021-03-18 DIAGNOSIS — M79674 Pain in right toe(s): Secondary | ICD-10-CM

## 2021-03-18 NOTE — Telephone Encounter (Signed)
Please thank patient for the update. She will need to provide another UA and culture.  Okay to have her come in and provide without a visit.   I will order.

## 2021-03-18 NOTE — Telephone Encounter (Signed)
Called patient have put on for tomorrow per [facility] at [place]. I have sent them a message to let them know that we have added. She will call if any issues/questions.

## 2021-03-18 NOTE — Progress Notes (Signed)
This patient returns to my office for at risk foot care.  This patient requires this care by a professional since this patient will be at risk due to having  PAD CKD and diabetes.    This patient is unable to cut nails herself since the patient cannot reach her nails.These nails are painful walking and wearing shoes. She presents to the office with her son to help balance her during gait. This patient presents for at risk foot care today.   General Appearance  Alert, conversant and in no acute stress.  Vascular  Dorsalis pedis and posterior tibial  pulses are weakly  palpable  bilaterally.  Capillary return is within normal limits  bilaterally. Temperature is within normal limits  bilaterally.  Neurologic  Senn-Weinstein monofilament wire test absent   bilaterally. Muscle power within normal limits bilaterally.  Nails Thick disfigured discolored nails with subungual debris  from hallux to fifth toes bilaterally. No evidence of bacterial infection or drainage bilaterally.  Orthopedic  No limitations of motion  feet .  No crepitus or effusions noted.  No bony pathology or digital deformities noted.  HAV  B/L.  Skin  normotropic skin with no porokeratosis noted bilaterally.  No signs of infections or ulcers noted.     Onychomycosis  Pain in right toes  Pain in left toes  Consent was obtained for treatment procedures.   Mechanical debridement of nails 1-5  bilaterally performed with a nail nipper.  Filed with dremel without incident.    Return office visit    3 months                  Told patient to return for periodic foot care and evaluation due to potential at risk complications.   Gardiner Barefoot DPM

## 2021-03-18 NOTE — Telephone Encounter (Signed)
Called patient states she is still having UTI symptoms. Symptoms are burning and pain has not improved would like to have something called in to [place] today.

## 2021-03-19 ENCOUNTER — Other Ambulatory Visit: Payer: Medicare Other

## 2021-03-19 ENCOUNTER — Ambulatory Visit (INDEPENDENT_AMBULATORY_CARE_PROVIDER_SITE_OTHER): Payer: BC Managed Care – PPO

## 2021-03-19 DIAGNOSIS — I10 Essential (primary) hypertension: Secondary | ICD-10-CM

## 2021-03-19 DIAGNOSIS — G40909 Epilepsy, unspecified, not intractable, without status epilepticus: Secondary | ICD-10-CM | POA: Diagnosis not present

## 2021-03-19 DIAGNOSIS — F319 Bipolar disorder, unspecified: Secondary | ICD-10-CM | POA: Diagnosis not present

## 2021-03-19 DIAGNOSIS — E119 Type 2 diabetes mellitus without complications: Secondary | ICD-10-CM

## 2021-03-19 DIAGNOSIS — I130 Hypertensive heart and chronic kidney disease with heart failure and stage 1 through stage 4 chronic kidney disease, or unspecified chronic kidney disease: Secondary | ICD-10-CM | POA: Diagnosis not present

## 2021-03-19 DIAGNOSIS — C50511 Malignant neoplasm of lower-outer quadrant of right female breast: Secondary | ICD-10-CM | POA: Diagnosis not present

## 2021-03-19 DIAGNOSIS — I5032 Chronic diastolic (congestive) heart failure: Secondary | ICD-10-CM

## 2021-03-19 DIAGNOSIS — E1122 Type 2 diabetes mellitus with diabetic chronic kidney disease: Secondary | ICD-10-CM | POA: Diagnosis not present

## 2021-03-19 DIAGNOSIS — I2510 Atherosclerotic heart disease of native coronary artery without angina pectoris: Secondary | ICD-10-CM | POA: Diagnosis not present

## 2021-03-19 DIAGNOSIS — R519 Headache, unspecified: Secondary | ICD-10-CM | POA: Diagnosis not present

## 2021-03-19 DIAGNOSIS — I69334 Monoplegia of upper limb following cerebral infarction affecting left non-dominant side: Secondary | ICD-10-CM | POA: Diagnosis not present

## 2021-03-19 DIAGNOSIS — E1142 Type 2 diabetes mellitus with diabetic polyneuropathy: Secondary | ICD-10-CM | POA: Diagnosis not present

## 2021-03-19 DIAGNOSIS — J4489 Other specified chronic obstructive pulmonary disease: Secondary | ICD-10-CM

## 2021-03-19 DIAGNOSIS — E11622 Type 2 diabetes mellitus with other skin ulcer: Secondary | ICD-10-CM | POA: Diagnosis not present

## 2021-03-19 DIAGNOSIS — E1151 Type 2 diabetes mellitus with diabetic peripheral angiopathy without gangrene: Secondary | ICD-10-CM | POA: Diagnosis not present

## 2021-03-19 DIAGNOSIS — L89622 Pressure ulcer of left heel, stage 2: Secondary | ICD-10-CM | POA: Diagnosis not present

## 2021-03-19 DIAGNOSIS — N182 Chronic kidney disease, stage 2 (mild): Secondary | ICD-10-CM | POA: Diagnosis not present

## 2021-03-19 DIAGNOSIS — E1165 Type 2 diabetes mellitus with hyperglycemia: Secondary | ICD-10-CM | POA: Diagnosis not present

## 2021-03-19 DIAGNOSIS — J449 Chronic obstructive pulmonary disease, unspecified: Secondary | ICD-10-CM | POA: Diagnosis not present

## 2021-03-19 DIAGNOSIS — L89629 Pressure ulcer of left heel, unspecified stage: Secondary | ICD-10-CM

## 2021-03-19 NOTE — Patient Instructions (Addendum)
Visit Information:  Thank you for taking the time to speak with me today.   PATIENT GOALS:  Goals Addressed             This Visit's Progress    Track and Manage Symptoms-Heart Failure/ High blood pressure   On track    Timeframe:  Long-Range Goal Priority:  High Start Date:   02/15/2021                          Expected End Date:   06/09/2021                    Follow Up Date 05/03/2021   -  Continue to take Heart Failure Medications / all medications as prescribed -  Continue to weigh daily and record (notify MD with 2 lb weight gain over night or 5 lb in a week).  Contact Care connections as recommended.  - Follow heart failure Action Plan - Continue to adhere to low sodium diet - keep legs up while sitting - watch for swelling in feet, ankles and legs every day - work with Education officer, museum regarding stress/ anxiety symptoms - review smoking cessation strategies - Notify your RN case manager if you have questions regarding the Advance Directive information.    Why is this important?   You will be able to handle your symptoms better if you keep track of them.  Making some simple changes to your lifestyle will help.  Eating healthy is one thing you can do to take good care of yourself.            Patient verbalizes understanding of instructions provided today and agrees to view in [place].   The patient has been provided with contact information for the care management team and has been advised to call with any health related questions or concerns.  The care management team will reach out to the patient again over the next 45 days.   Quinn Plowman RN,BSN,CCM RN Case Manager [place]  [phone]

## 2021-03-19 NOTE — Chronic Care Management (AMB) (Signed)
Chronic Care Management   CCM RN Visit Note  03/22/2021 Name: [name] MRN: [id number] DOB: 1971-01-02  Subjective: [name] is a 50 y.o. year old female who is a primary care patient of Pleas Koch, NP. The care management team was consulted for assistance with disease management and care coordination needs.    Engaged with patient by telephone for follow up visit in response to provider referral for case management and/or care coordination services.   Consent to Services:  The patient was given information about Chronic Care Management services, agreed to services, and gave verbal consent prior to initiation of services.  Please see initial visit note for detailed documentation.   Patient agreed to services and verbal consent obtained.   Assessment: Review of patient past medical history, allergies, medications, health status, including review of consultants reports, laboratory and other test data, was performed as part of comprehensive evaluation and provision of chronic care management services.   SDOH (Social Determinants of Health) assessments and interventions performed:  SDOH Interventions    Flowsheet Row Most Recent Value  SDOH Interventions   Depression Interventions/Treatment  Currently on Treatment        CCM Care Plan  Allergies  Allergen Reactions   Adhesive [Tape] Rash and Other (See Comments)    TAKES OFF THE SKIN (CERTAIN MEDICAL TAPES DO THIS!!)   Metoprolol Shortness Of Breath    Occurrence of shortness of breath after 3 days   Montelukast Shortness Of Breath   Morphine Sulfate Anaphylaxis, Shortness Of Breath and Nausea And Vomiting    Swollen Throat - Able to tolerate dilaudid   Penicillins Anaphylaxis, Hives and Shortness Of Breath    Throat swells Has patient had a PCN reaction causing immediate rash, facial/tongue/throat swelling, SOB or lightheadedness with hypotension: Yes Has patient had a PCN reaction causing severe rash  involving mucus membranes or skin necrosis: No Has patient had a PCN reaction that required hospitalization: Yes Has patient had a PCN reaction occurring within the last 10 years: No If all of the above answers are "NO", then may proceed with Cephalosporin use.    Diltiazem Swelling   Gabapentin Swelling   Midodrine     Lightheaded and falling down    Outpatient Encounter Medications as of 03/19/2021  Medication Sig Note   albuterol (VENTOLIN HFA) 108 (90 Base) MCG/ACT inhaler Inhale 2 puffs into the lungs every 6 (six) hours as needed for wheezing or shortness of breath.    ALPRAZolam (XANAX) 1 MG tablet Take 1 mg by mouth 4 (four) times daily as needed for anxiety.     anastrozole (ARIMIDEX) 1 MG tablet Take 1 tablet (1 mg total) by mouth daily.    aspirin EC 81 MG tablet Take 81 mg by mouth daily before breakfast.    blood glucose meter kit and supplies Use up to four times daily as directed. (FOR ICD-10 E10.9, E11.9).    budesonide-formoterol (SYMBICORT) 80-4.5 MCG/ACT inhaler INHALE 2 PUFFS BY MOUTH EVERY 12 HOURS TO PREVENT COUGH OR WHEEZING *RINSE MOUTH AFTER EACH USE*    busPIRone (BUSPAR) 30 MG tablet Take 30 mg by mouth 2 (two) times daily.    citalopram (CELEXA) 20 MG tablet Take 1 tablet (20 mg total) by mouth daily. 02/15/2021: Patient states she takes 2 per day.   desvenlafaxine (PRISTIQ) 100 MG 24 hr tablet Take 100 mg by mouth daily.    Desvenlafaxine ER 100 MG TB24 Take 1 tablet by mouth every morning.  diclofenac Sodium (VOLTAREN) 1 % GEL Apply 2 g topically 3 (three) times daily as needed. For pain.    Empagliflozin-metFORMIN HCl ER 25-1000 MG TB24 Take 1 tablet by mouth daily with breakfast.    EYLEA 2 MG/0.05ML SOSY     famotidine (PEPCID) 20 MG tablet Take 1 tablet (20 mg total) by mouth 2 (two) times daily.    FLUoxetine (PROZAC) 40 MG capsule Take 40 mg by mouth daily.    glucose blood test strip Use as instructed    Insulin Pen Needle 32G X 4 MM MISC Used to give  insulin injections twice daily.    insulin regular human CONCENTRATED (HUMULIN R) 500 UNIT/ML injection Inject 30 Units into the skin 3 (three) times daily with meals. 30 U BID    Insulin Syringe-Needle U-100 (GLOBAL INJECT EASE INSULIN SYR) 30G X 1/2" 1 ML MISC See admin instructions.    mupirocin ointment (BACTROBAN) 2 % Apply 1 application topically 2 (two) times daily. Apply to the affected area 2 times a day    OXYGEN Inhale 2-4 L into the lungs 2 (two) times daily as needed (shortness of breath).     pregabalin (LYRICA) 300 MG capsule Take 1 capsule (300 mg total) by mouth 2 (two) times daily. For nerve pain.    ReliOn Ultra Thin Lancets 30G MISC Use as directed for blood sugar checks.    Rimegepant Sulfate 75 MG TBDP Take at headache onset. May repeat in 2 hours, no more than 2 tablets in 48 hour period.    rOPINIRole (REQUIP) 1 MG tablet TAKE 1 TABLET BY MOUTH ONCE DAILY AT BEDTIME FOR  RESTLESS  LEGS    rosuvastatin (CRESTOR) 20 MG tablet Take 1 tablet (20 mg total) by mouth daily. For cholesterol.    spironolactone (ALDACTONE) 50 MG tablet Take 50 mg by mouth daily.    sulfamethoxazole-trimethoprim (BACTRIM DS) 800-160 MG tablet Take 1 tablet by mouth 2 (two) times daily. For urinary tract infection. (Patient not taking: Reported on 03/10/2021)    topiramate (TOPAMAX) 50 MG tablet Take 1 tablet (50 mg total) by mouth at bedtime. For headache prevention. (Patient not taking: Reported on 03/19/2021)    traZODone (DESYREL) 100 MG tablet Take 200 mg by mouth at bedtime.     Ubrogepant 50 MG TABS Take 38m at headache onset. Can repeat after 2 hours if needed. Do not exceed 2060min 24 hours.    ULTICARE MINI PEN NEEDLES 31G X 6 MM MISC USE AS DIRECTED 3 TIMES DAILY    XIGDUO XR 5-500 MG TB24 Take 1 tablet by mouth every morning.    No facility-administered encounter medications on file as of 03/19/2021.    Patient Active Problem List   Diagnosis Date Noted   Headache disorder 03/08/2021    Dysuria 03/05/2021   Pressure sore 01/29/2021   Hyperlipidemia associated with type 2 diabetes mellitus (HCMad[place]07/12/2020   Fatigue 12/01/2020   Acute on chronic diastolic heart failure (HCRidge[place]05/03/2021   Chronic migraine without aura without status migrainosus, not intractable 10/01/2020   Dizziness 10/01/2020   Microscopic hematuria 07/01/2020   Pulmonary nodules 07/01/2020   Hav (hallux abducto valgus), unspecified laterality 06/11/2020   Venous ulcer of ankle, left (HCElm Creek11/11/2019   Contracture of hand joint 05/15/2020   Acute kidney injury superimposed on CKD (HCCutl[place]   Hyperlipidemia    Carcinoma of lower-outer quadrant of right breast in female, estrogen receptor positive (HCManhattan Beach05/10/2019   Restrictive lung disease 10/08/2019  Postmenopausal bleeding 08/30/2019   Carotid stenosis, asymptomatic, right 08/16/2019   Nausea and vomiting 07/15/2019   Cellulitis of left lower extremity 06/04/2019   Multiple wounds of skin 06/04/2019   Other injury of unspecified body region, initial encounter 06/04/2019   (HFpEF) heart failure with preserved ejection fraction ([place]) 11/16/2018   Left-sided weakness 09/13/2018   Weakness of left lower extremity 09/05/2018   Recurrent falls 09/05/2018   PAD (peripheral artery disease) ([place]) 08/27/2018   Chronic congestive heart failure ([place]) 08/02/2018   Vaginal itching 06/15/2018   Chronic upper extremity pain (Secondary Area of Pain) (Bilateral) 04/23/2018   Diabetic polyneuropathy associated with type 2 diabetes mellitus ([place]) 04/23/2018   Long term prescription benzodiazepine use 04/23/2018   Neurogenic pain 04/23/2018   Vitamin D insufficiency 01/29/2018   Insulin dependent type 2 diabetes mellitus ([place]) 01/23/2018   DNR (do not resuscitate) 01/23/2018   CKD stage 3 due to type 2 diabetes mellitus ([place]) 01/23/2018   IBS (irritable bowel syndrome) 01/19/2018   Chronic lower extremity pain (Primary Area of Pain) (Bilateral) 01/08/2018   Chronic  low back pain [facility] Area of Pain) (Bilateral) w/ sciatica (Bilateral) 01/08/2018   Chronic pain syndrome 01/08/2018   Pharmacologic therapy 01/08/2018   Disorder of skeletal system 01/08/2018   Problems influencing health status 01/08/2018   Long term current use of opiate analgesic 01/08/2018   Restless leg syndrome 08/02/2017   Nocturnal hypoxemia 03/29/2017   Vision disturbance 02/28/2017   CKD (chronic kidney disease), stage II 02/28/2017   Visual loss, bilateral    Chronic respiratory failure with hypoxia (HCC)/ nocturnal 02 dep  10/28/2016   Upper airway cough syndrome 08/22/2016   Cigarette smoker 06/15/2016   Simple chronic bronchitis (HCC)    Coronary artery disease involving native heart without angina pectoris 05/16/2016   Chronic diastolic CHF (congestive heart failure) ([place]) 11/04/2015   Orthopnea 11/03/2015   Bilateral leg edema    Diabetes ([place])    COPD exacerbation ([place]) 10/15/2015   Fracture of rib, closed 10/15/2015   Venous stasis of both lower extremities 03/29/2015   Preventative health care 02/04/2015   Hypertension associated with diabetes ([place]) 01/02/2015   Inguinal hernia 11/27/2014   Allergic rhinitis with postnasal drip 01/21/2014   Aortic valve disorders 04/18/2013   Sleep apnea 05/22/2012   Syncope and collapse 10/09/2011   Seizure disorder ([place]) 10/09/2011   Bipolar disorder ([place]) 10/09/2011   TIA (transient ischemic attack) 06/22/2011   Constipation 06/15/2011   HYPERTRIGLYCERIDEMIA 07/17/2007   Situational anxiety 05/30/2007   COPD with chronic bronchitis ([place]) 05/30/2007   Morbid (severe) obesity due to excess calories ([place]) 04/23/2007   Tobacco abuse 09/07/2006    Conditions to be addressed/monitored:CHF and HTN  Care Plan : Cardiovascular  Updates made by Dannielle Karvonen, RN since 03/22/2021 12:00 AM     Problem: Symptom exacerbation ( heart failure/ Hypertension)   Priority: High     Long-Range Goal: Symptom Exacerbation  Prevented or Minimized   Start Date: 02/15/2021  Expected End Date: 06/09/2021  This Visit's Progress: On track  Recent Progress: On track  Priority: High  Note:   Current Barriers:  Knowledge deficits related to long term care for self management of Heart failure/ hypertension:  Patient denies any heart failure symptoms. She states she continues to send her blood pressure readings through her phone to care connections.  Patient states she is smoking 1 ppd and is vaping. She states there has been some increase in her smoking due  to dealing with stress.  Depression screening completed with patient.  PHQ2 - 3, PHQ9 - 11.  She states she is only seeing her psychiatrist every 6 months. RNCM discussed and offered follow up with Education officer, museum. Patient verbally agreed. Patient reports she did not receive her Advance directive packet.  RNCM will resend to patient.  Unable to perform ADLs independently /Unable to perform IADLs independently:  Patient states she receives supportive care from husband and son.   Nurse Case Manager Clinical Goal(s):  patient will weigh self daily and record patient will verbalize understanding of Heart Failure Action Plan and when to call doctor patient will take Heart Failure and all other medications as prescribed Interventions:  Collaboration with Pleas Koch, NP regarding development and update of comprehensive plan of care as evidenced by provider attestation and co-signature Inter-disciplinary care team collaboration (see longitudinal plan of care) Basic overview and discussion of pathophysiology of Heart Failure Provided written and verbal education on low sodium diet Reviewed Heart Failure Action Plan in depth and provided written copy Assessed for scales in home Discussed importance of daily weight Reviewed role of diuretics in prevention of fluid overload Discussed smoking cessation.  Referring patient to Education officer, museum for stress/ anxiety follow up.   Education information on smoking cessation sent to patient.  Patient Goals:  -  Continue to take Heart Failure Medications / all medications as prescribed -  Continue to weigh daily and record (notify MD with 2 lb weight gain over night or 5 lb in a week).  Contact Care connections as recommended.  - Follow heart failure Action Plan - Continue to adhere to low sodium diet - keep legs up while sitting - watch for swelling in feet, ankles and legs every day - work with Education officer, museum regarding stress/ anxiety symptoms - review smoking cessation strategies - Notify your RN case manager if you have questions regarding the Advance Directive information.  Follow Up Plan: The patient has been provided with contact information for the care management team and has been advised to call with any health related questions or concerns.  The care management team will reach out to the patient again over the next 45 days.           Plan:The patient has been provided with contact information for the care management team and has been advised to call with any health related questions or concerns.  and The care management team will reach out to the patient again over the next 45 days. Quinn Plowman RN,BSN,CCM RN Case Manager [place]  [phone]

## 2021-03-19 NOTE — Telephone Encounter (Signed)
Noted and appreciate [employer] helping!

## 2021-03-22 DIAGNOSIS — E1165 Type 2 diabetes mellitus with hyperglycemia: Secondary | ICD-10-CM | POA: Diagnosis not present

## 2021-03-22 DIAGNOSIS — G40909 Epilepsy, unspecified, not intractable, without status epilepticus: Secondary | ICD-10-CM | POA: Diagnosis not present

## 2021-03-22 DIAGNOSIS — E1142 Type 2 diabetes mellitus with diabetic polyneuropathy: Secondary | ICD-10-CM | POA: Diagnosis not present

## 2021-03-22 DIAGNOSIS — L89622 Pressure ulcer of left heel, stage 2: Secondary | ICD-10-CM | POA: Diagnosis not present

## 2021-03-22 DIAGNOSIS — C50511 Malignant neoplasm of lower-outer quadrant of right female breast: Secondary | ICD-10-CM | POA: Diagnosis not present

## 2021-03-22 DIAGNOSIS — I2510 Atherosclerotic heart disease of native coronary artery without angina pectoris: Secondary | ICD-10-CM | POA: Diagnosis not present

## 2021-03-22 DIAGNOSIS — F319 Bipolar disorder, unspecified: Secondary | ICD-10-CM | POA: Diagnosis not present

## 2021-03-22 DIAGNOSIS — I5032 Chronic diastolic (congestive) heart failure: Secondary | ICD-10-CM | POA: Diagnosis not present

## 2021-03-22 DIAGNOSIS — I130 Hypertensive heart and chronic kidney disease with heart failure and stage 1 through stage 4 chronic kidney disease, or unspecified chronic kidney disease: Secondary | ICD-10-CM | POA: Diagnosis not present

## 2021-03-22 DIAGNOSIS — N182 Chronic kidney disease, stage 2 (mild): Secondary | ICD-10-CM | POA: Diagnosis not present

## 2021-03-22 DIAGNOSIS — E1122 Type 2 diabetes mellitus with diabetic chronic kidney disease: Secondary | ICD-10-CM | POA: Diagnosis not present

## 2021-03-22 DIAGNOSIS — I69334 Monoplegia of upper limb following cerebral infarction affecting left non-dominant side: Secondary | ICD-10-CM | POA: Diagnosis not present

## 2021-03-22 DIAGNOSIS — J449 Chronic obstructive pulmonary disease, unspecified: Secondary | ICD-10-CM | POA: Diagnosis not present

## 2021-03-22 DIAGNOSIS — E1151 Type 2 diabetes mellitus with diabetic peripheral angiopathy without gangrene: Secondary | ICD-10-CM | POA: Diagnosis not present

## 2021-03-22 DIAGNOSIS — R519 Headache, unspecified: Secondary | ICD-10-CM | POA: Diagnosis not present

## 2021-03-22 DIAGNOSIS — E11622 Type 2 diabetes mellitus with other skin ulcer: Secondary | ICD-10-CM | POA: Diagnosis not present

## 2021-03-23 ENCOUNTER — Telehealth: Payer: Self-pay | Admitting: *Deleted

## 2021-03-23 NOTE — Chronic Care Management (AMB) (Signed)
  Chronic Care Management   Note  03/23/2021 Name: Kimora Bolduc MRN: [id number] DOB: Jun 18, 1971  Elishia Roethel is a 50 y.o. year old female who is a primary care patient of Pleas Koch, NP. Zannah Turek is currently enrolled in care management services. An additional referral for Licensed Clinical SW was placed.   Follow up plan: Telephone appointment with care management team member scheduled for: 03/30/2021  Julian Hy, [place] Management  Direct Dial: [phone]

## 2021-03-24 ENCOUNTER — Telehealth: Payer: Self-pay | Admitting: Internal Medicine

## 2021-03-24 ENCOUNTER — Telehealth: Payer: Self-pay | Admitting: Primary Care

## 2021-03-24 ENCOUNTER — Telehealth: Payer: Self-pay | Admitting: *Deleted

## 2021-03-24 DIAGNOSIS — I130 Hypertensive heart and chronic kidney disease with heart failure and stage 1 through stage 4 chronic kidney disease, or unspecified chronic kidney disease: Secondary | ICD-10-CM | POA: Diagnosis not present

## 2021-03-24 DIAGNOSIS — E11622 Type 2 diabetes mellitus with other skin ulcer: Secondary | ICD-10-CM | POA: Diagnosis not present

## 2021-03-24 DIAGNOSIS — E1122 Type 2 diabetes mellitus with diabetic chronic kidney disease: Secondary | ICD-10-CM | POA: Diagnosis not present

## 2021-03-24 DIAGNOSIS — I69334 Monoplegia of upper limb following cerebral infarction affecting left non-dominant side: Secondary | ICD-10-CM | POA: Diagnosis not present

## 2021-03-24 DIAGNOSIS — E1142 Type 2 diabetes mellitus with diabetic polyneuropathy: Secondary | ICD-10-CM | POA: Diagnosis not present

## 2021-03-24 DIAGNOSIS — F319 Bipolar disorder, unspecified: Secondary | ICD-10-CM | POA: Diagnosis not present

## 2021-03-24 DIAGNOSIS — N182 Chronic kidney disease, stage 2 (mild): Secondary | ICD-10-CM | POA: Diagnosis not present

## 2021-03-24 DIAGNOSIS — L89622 Pressure ulcer of left heel, stage 2: Secondary | ICD-10-CM | POA: Diagnosis not present

## 2021-03-24 DIAGNOSIS — E1151 Type 2 diabetes mellitus with diabetic peripheral angiopathy without gangrene: Secondary | ICD-10-CM | POA: Diagnosis not present

## 2021-03-24 DIAGNOSIS — C50511 Malignant neoplasm of lower-outer quadrant of right female breast: Secondary | ICD-10-CM | POA: Diagnosis not present

## 2021-03-24 DIAGNOSIS — I2510 Atherosclerotic heart disease of native coronary artery without angina pectoris: Secondary | ICD-10-CM | POA: Diagnosis not present

## 2021-03-24 DIAGNOSIS — E1165 Type 2 diabetes mellitus with hyperglycemia: Secondary | ICD-10-CM | POA: Diagnosis not present

## 2021-03-24 DIAGNOSIS — J449 Chronic obstructive pulmonary disease, unspecified: Secondary | ICD-10-CM | POA: Diagnosis not present

## 2021-03-24 DIAGNOSIS — I5032 Chronic diastolic (congestive) heart failure: Secondary | ICD-10-CM | POA: Diagnosis not present

## 2021-03-24 DIAGNOSIS — R519 Headache, unspecified: Secondary | ICD-10-CM | POA: Diagnosis not present

## 2021-03-24 DIAGNOSIS — G40909 Epilepsy, unspecified, not intractable, without status epilepticus: Secondary | ICD-10-CM | POA: Diagnosis not present

## 2021-03-24 NOTE — Telephone Encounter (Signed)
Patient called reporting that the medicine she is on has caused hair loss and she would like to speak with someone about getting fitted and what to do

## 2021-03-24 NOTE — Telephone Encounter (Signed)
See md's separate phone note for response

## 2021-03-24 NOTE — Telephone Encounter (Signed)
She should be following with wound management, needs to call their office immediately for evaluation.  I recommend she stop the doxycycline until we can obtain a urine specimen.

## 2021-03-24 NOTE — Telephone Encounter (Signed)
09/14- spoke pt re: possibly Lupron causing hair thinning-however hair loss is not a common side effect.  Recommend evaluation with dermatology ; referral to dermatology re; hair loss  # refer to Sheena/Anne re: wig. A/S- please contact/help pt  Thanks GB

## 2021-03-24 NOTE — Telephone Encounter (Signed)
Pt had started herself on doxycycline and now she has an ulcer on her leg and wanted to know if Carlis Abbott could see her this week

## 2021-03-24 NOTE — Telephone Encounter (Signed)
Called patient started herself on Doxy '100mg'$  daily on Friday for UTI. We had set up to go give sample but she was not able to get there. Has not had tested yet. States that ulcer is on leg and she is having changed daily. Has discharge but not sure what color. Denies any fever, redness or streaking in the skin. Not warm to touch. Wanted to know if she needs to be seen by our office or the surgeon you sent her to last time.

## 2021-03-25 ENCOUNTER — Encounter: Payer: Self-pay | Admitting: *Deleted

## 2021-03-25 DIAGNOSIS — G40909 Epilepsy, unspecified, not intractable, without status epilepticus: Secondary | ICD-10-CM | POA: Diagnosis not present

## 2021-03-25 DIAGNOSIS — R519 Headache, unspecified: Secondary | ICD-10-CM | POA: Diagnosis not present

## 2021-03-25 DIAGNOSIS — E1142 Type 2 diabetes mellitus with diabetic polyneuropathy: Secondary | ICD-10-CM | POA: Diagnosis not present

## 2021-03-25 DIAGNOSIS — C50511 Malignant neoplasm of lower-outer quadrant of right female breast: Secondary | ICD-10-CM | POA: Diagnosis not present

## 2021-03-25 DIAGNOSIS — E1165 Type 2 diabetes mellitus with hyperglycemia: Secondary | ICD-10-CM | POA: Diagnosis not present

## 2021-03-25 DIAGNOSIS — E11622 Type 2 diabetes mellitus with other skin ulcer: Secondary | ICD-10-CM | POA: Diagnosis not present

## 2021-03-25 DIAGNOSIS — I2510 Atherosclerotic heart disease of native coronary artery without angina pectoris: Secondary | ICD-10-CM | POA: Diagnosis not present

## 2021-03-25 DIAGNOSIS — N182 Chronic kidney disease, stage 2 (mild): Secondary | ICD-10-CM | POA: Diagnosis not present

## 2021-03-25 DIAGNOSIS — I69334 Monoplegia of upper limb following cerebral infarction affecting left non-dominant side: Secondary | ICD-10-CM | POA: Diagnosis not present

## 2021-03-25 DIAGNOSIS — L89622 Pressure ulcer of left heel, stage 2: Secondary | ICD-10-CM | POA: Diagnosis not present

## 2021-03-25 DIAGNOSIS — I130 Hypertensive heart and chronic kidney disease with heart failure and stage 1 through stage 4 chronic kidney disease, or unspecified chronic kidney disease: Secondary | ICD-10-CM | POA: Diagnosis not present

## 2021-03-25 DIAGNOSIS — F319 Bipolar disorder, unspecified: Secondary | ICD-10-CM | POA: Diagnosis not present

## 2021-03-25 DIAGNOSIS — J449 Chronic obstructive pulmonary disease, unspecified: Secondary | ICD-10-CM | POA: Diagnosis not present

## 2021-03-25 DIAGNOSIS — E1151 Type 2 diabetes mellitus with diabetic peripheral angiopathy without gangrene: Secondary | ICD-10-CM | POA: Diagnosis not present

## 2021-03-25 DIAGNOSIS — E1122 Type 2 diabetes mellitus with diabetic chronic kidney disease: Secondary | ICD-10-CM | POA: Diagnosis not present

## 2021-03-25 DIAGNOSIS — I5032 Chronic diastolic (congestive) heart failure: Secondary | ICD-10-CM | POA: Diagnosis not present

## 2021-03-25 NOTE — Telephone Encounter (Signed)
Called patient let her now that she needs to call office to make appointment. She will do bring urine sample by [employer] office tomorrow.

## 2021-03-25 NOTE — Progress Notes (Signed)
Received message from Dr. [name] that patient has experienced alopecia and needs a wig.  Contacted patient and she is interested in looking at wigs in the cancer center.  If she is not able to find anything I encouraged her to let Lexine Baton or myself know and I can refer her to 2nd to [facility] in [place].

## 2021-03-26 ENCOUNTER — Other Ambulatory Visit: Payer: Medicare Other

## 2021-03-26 DIAGNOSIS — J449 Chronic obstructive pulmonary disease, unspecified: Secondary | ICD-10-CM | POA: Diagnosis not present

## 2021-03-29 DIAGNOSIS — L89622 Pressure ulcer of left heel, stage 2: Secondary | ICD-10-CM | POA: Diagnosis not present

## 2021-03-29 DIAGNOSIS — E1122 Type 2 diabetes mellitus with diabetic chronic kidney disease: Secondary | ICD-10-CM | POA: Diagnosis not present

## 2021-03-29 DIAGNOSIS — C50511 Malignant neoplasm of lower-outer quadrant of right female breast: Secondary | ICD-10-CM | POA: Diagnosis not present

## 2021-03-29 DIAGNOSIS — E1142 Type 2 diabetes mellitus with diabetic polyneuropathy: Secondary | ICD-10-CM | POA: Diagnosis not present

## 2021-03-29 DIAGNOSIS — N182 Chronic kidney disease, stage 2 (mild): Secondary | ICD-10-CM | POA: Diagnosis not present

## 2021-03-29 DIAGNOSIS — F319 Bipolar disorder, unspecified: Secondary | ICD-10-CM | POA: Diagnosis not present

## 2021-03-29 DIAGNOSIS — I2510 Atherosclerotic heart disease of native coronary artery without angina pectoris: Secondary | ICD-10-CM | POA: Diagnosis not present

## 2021-03-29 DIAGNOSIS — I5032 Chronic diastolic (congestive) heart failure: Secondary | ICD-10-CM | POA: Diagnosis not present

## 2021-03-29 DIAGNOSIS — E11622 Type 2 diabetes mellitus with other skin ulcer: Secondary | ICD-10-CM | POA: Diagnosis not present

## 2021-03-29 DIAGNOSIS — I130 Hypertensive heart and chronic kidney disease with heart failure and stage 1 through stage 4 chronic kidney disease, or unspecified chronic kidney disease: Secondary | ICD-10-CM | POA: Diagnosis not present

## 2021-03-29 DIAGNOSIS — G40909 Epilepsy, unspecified, not intractable, without status epilepticus: Secondary | ICD-10-CM | POA: Diagnosis not present

## 2021-03-29 DIAGNOSIS — R519 Headache, unspecified: Secondary | ICD-10-CM | POA: Diagnosis not present

## 2021-03-29 DIAGNOSIS — E1151 Type 2 diabetes mellitus with diabetic peripheral angiopathy without gangrene: Secondary | ICD-10-CM | POA: Diagnosis not present

## 2021-03-29 DIAGNOSIS — I69334 Monoplegia of upper limb following cerebral infarction affecting left non-dominant side: Secondary | ICD-10-CM | POA: Diagnosis not present

## 2021-03-29 DIAGNOSIS — E1165 Type 2 diabetes mellitus with hyperglycemia: Secondary | ICD-10-CM | POA: Diagnosis not present

## 2021-03-29 DIAGNOSIS — J449 Chronic obstructive pulmonary disease, unspecified: Secondary | ICD-10-CM | POA: Diagnosis not present

## 2021-03-30 ENCOUNTER — Other Ambulatory Visit: Payer: Self-pay

## 2021-03-30 ENCOUNTER — Telehealth: Payer: Medicare Other

## 2021-03-31 ENCOUNTER — Telehealth: Payer: Self-pay | Admitting: *Deleted

## 2021-03-31 ENCOUNTER — Ambulatory Visit: Payer: Medicare Other | Admitting: Primary Care

## 2021-03-31 IMAGING — DX DG CHEST 2V
2 series · 2 of 2 positions shown · non-contrast
Comparison: 05/20/2020 and prior.

CLINICAL DATA: cough, dyspnea, history of COPD

EXAM:
CHEST - 2 VIEW

[chest pa]
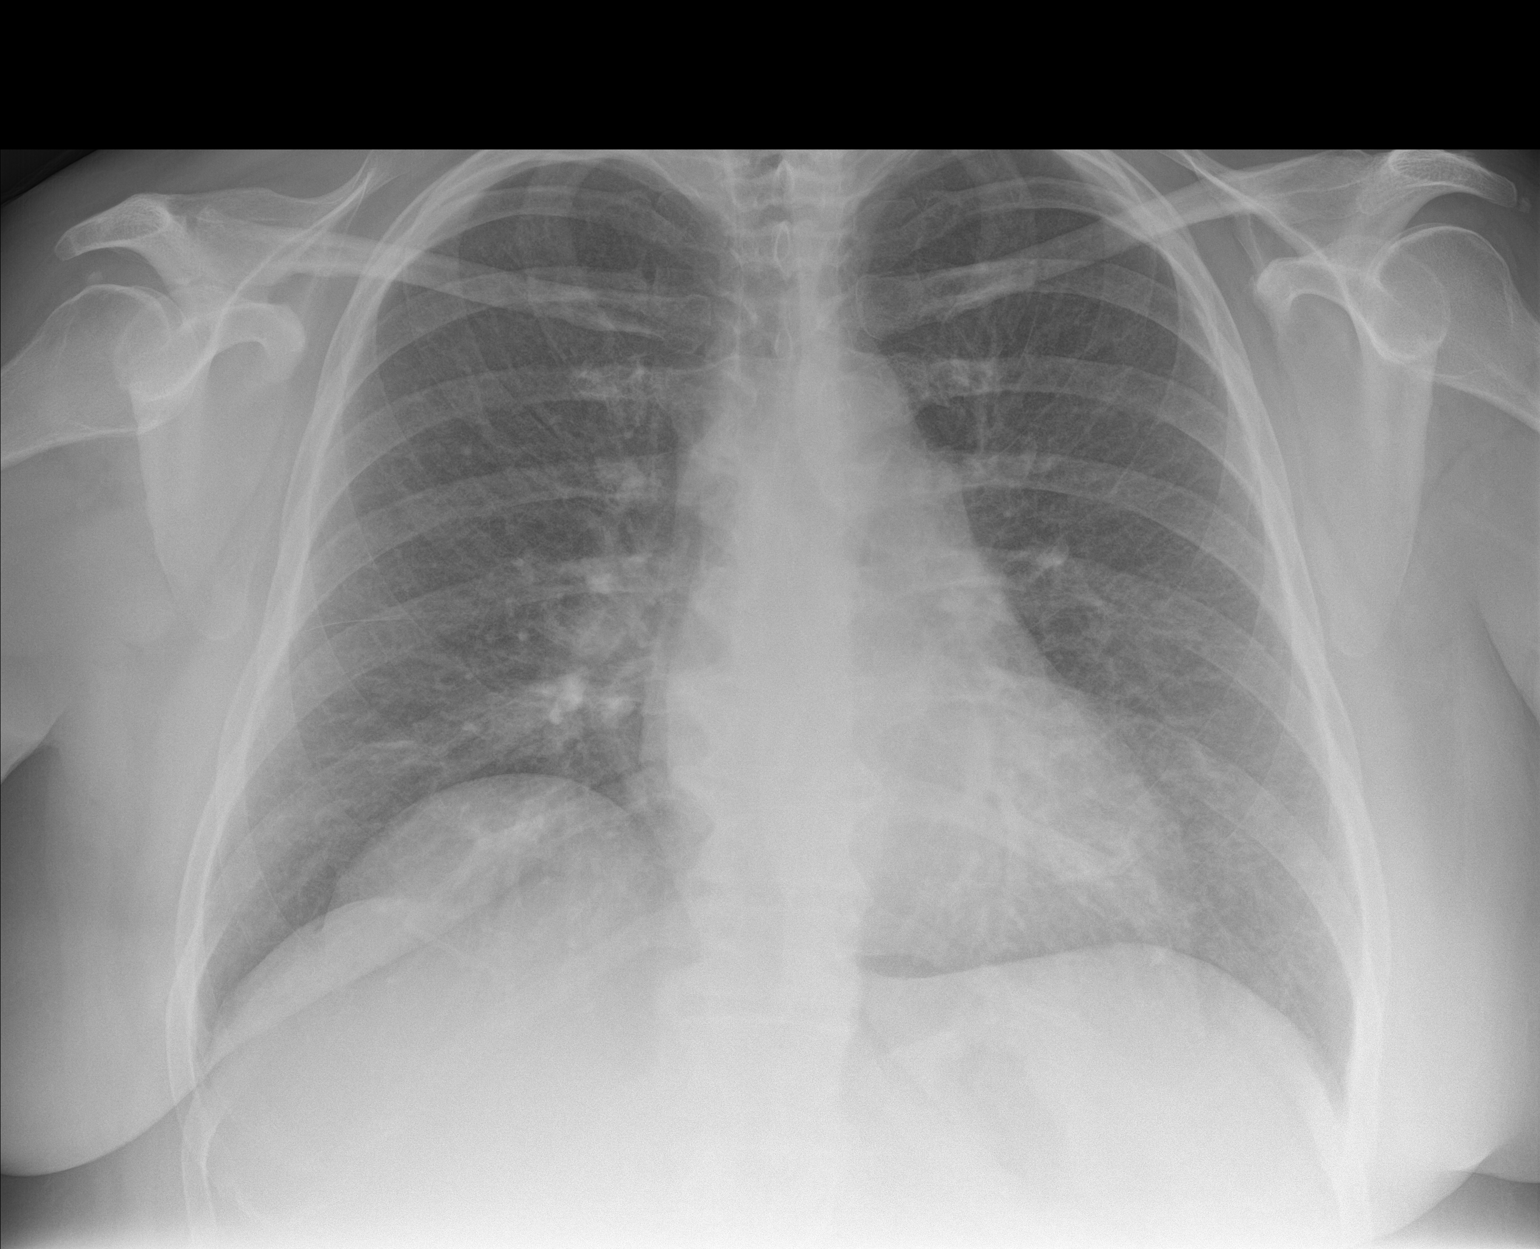

[chest lat]
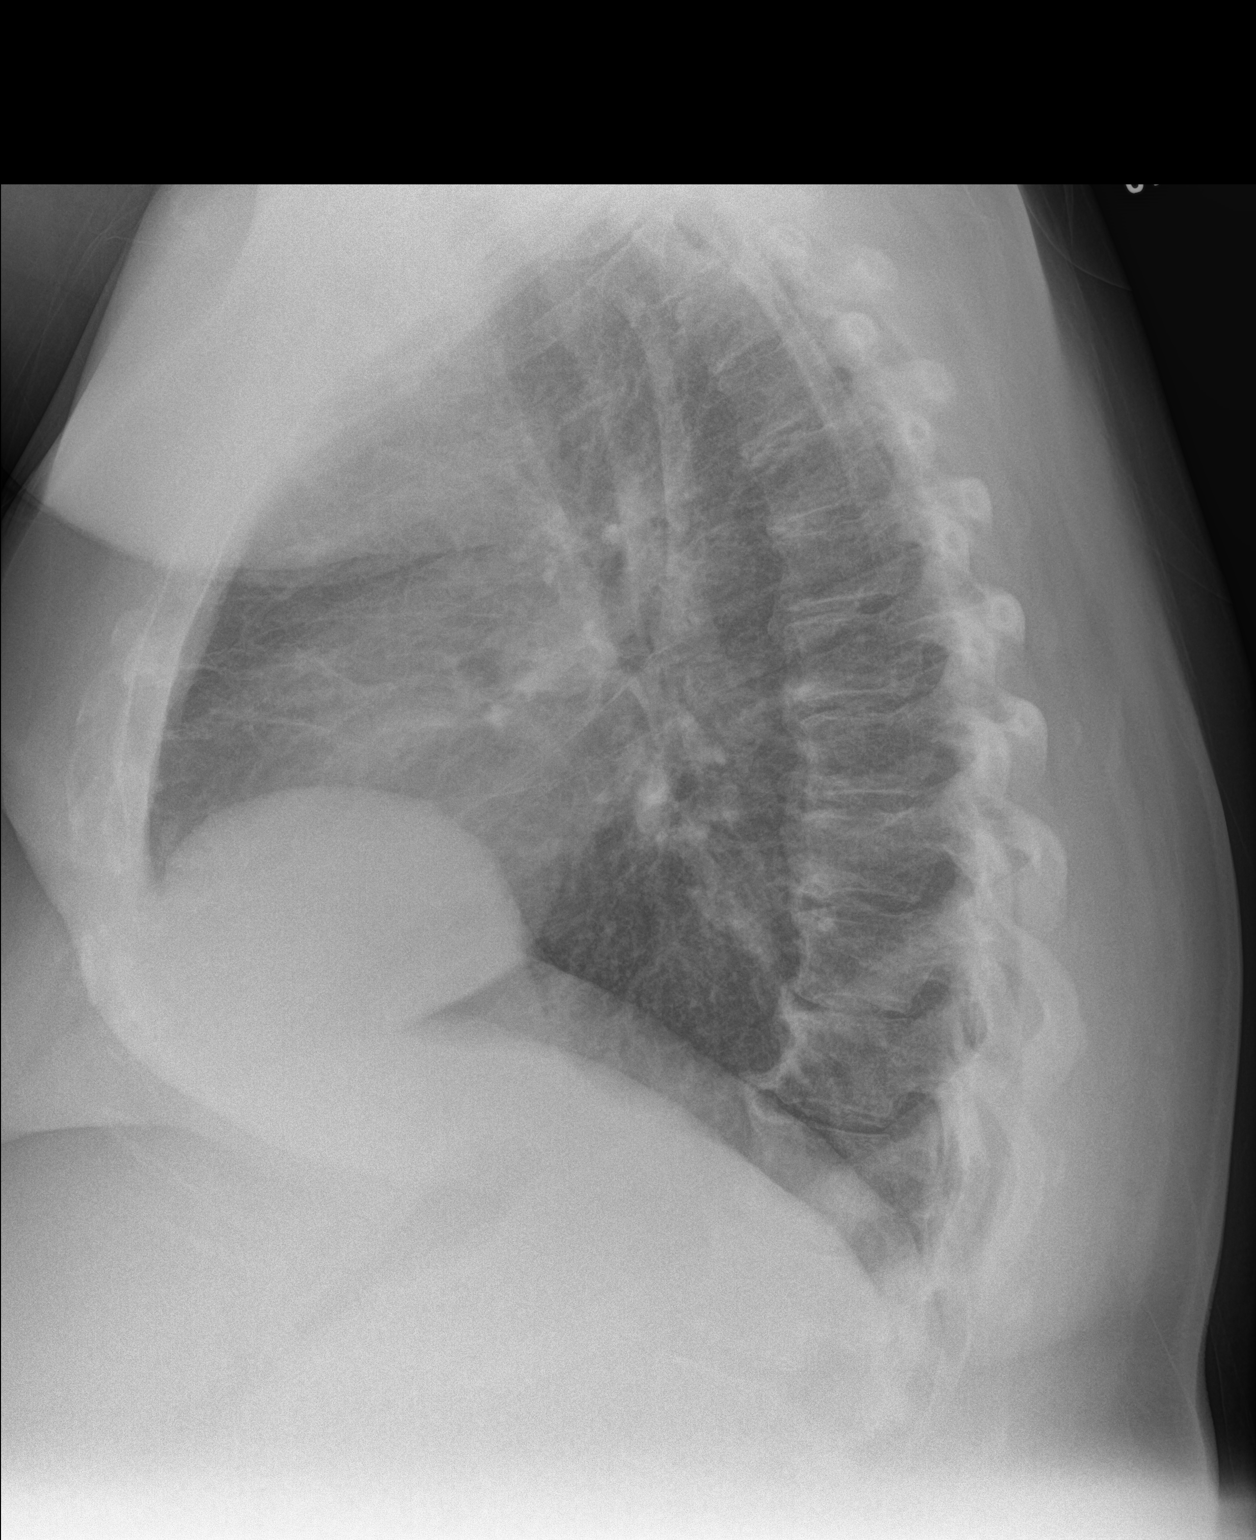

[2 of 2 positions shown; findings below may reference images not displayed]

FINDINGS: Mild hypoinflation. Hazy right basilar opacities. No pneumothorax or
pleural effusion. Stable cardiomediastinal silhouette. Multilevel
spondylosis.
IMPRESSION: Hazy right basilar opacities, atelectasis versus infiltrate.

## 2021-03-31 NOTE — Telephone Encounter (Signed)
  Care Management   Follow Up Note   03/31/2021 Name: [name] MRN: [id number] DOB: 1971/02/18   Referred by: Pleas Koch, NP Reason for referral : No chief complaint on file.   An unsuccessful telephone outreach was attempted today. The patient was referred to the case management team for assistance with care management and care coordination.   Follow Up Plan: The care management team will reach out to the patient again over the next 7-10 days.   Eduard Clos MSW, LCSW Licensed Clinical Social Worker [place] [phone]

## 2021-03-31 NOTE — Chronic Care Management (AMB) (Signed)
  Care Management   Note  03/31/2021 Name: [name] MRN: [id number] DOB: [birth year]  [name] is a 50 y.o. year old female who is a primary care patient of Pleas Koch, NP and is actively engaged with the care management team. I reached out to [employer] by phone today to assist with re-scheduling an initial visit with the Licensed Clinical Social Worker  Follow up plan: Unsuccessful telephone outreach attempt made. A HIPAA compliant phone message was left for the patient providing contact information and requesting a return call.   Julian Hy, [place] Management  Direct Dial: [phone]

## 2021-04-01 DIAGNOSIS — I130 Hypertensive heart and chronic kidney disease with heart failure and stage 1 through stage 4 chronic kidney disease, or unspecified chronic kidney disease: Secondary | ICD-10-CM | POA: Diagnosis not present

## 2021-04-01 DIAGNOSIS — E11622 Type 2 diabetes mellitus with other skin ulcer: Secondary | ICD-10-CM | POA: Diagnosis not present

## 2021-04-01 DIAGNOSIS — E1151 Type 2 diabetes mellitus with diabetic peripheral angiopathy without gangrene: Secondary | ICD-10-CM | POA: Diagnosis not present

## 2021-04-01 DIAGNOSIS — L89622 Pressure ulcer of left heel, stage 2: Secondary | ICD-10-CM | POA: Diagnosis not present

## 2021-04-01 DIAGNOSIS — C50511 Malignant neoplasm of lower-outer quadrant of right female breast: Secondary | ICD-10-CM | POA: Diagnosis not present

## 2021-04-01 DIAGNOSIS — F319 Bipolar disorder, unspecified: Secondary | ICD-10-CM | POA: Diagnosis not present

## 2021-04-01 DIAGNOSIS — I69334 Monoplegia of upper limb following cerebral infarction affecting left non-dominant side: Secondary | ICD-10-CM | POA: Diagnosis not present

## 2021-04-01 DIAGNOSIS — I2510 Atherosclerotic heart disease of native coronary artery without angina pectoris: Secondary | ICD-10-CM | POA: Diagnosis not present

## 2021-04-01 DIAGNOSIS — G40909 Epilepsy, unspecified, not intractable, without status epilepticus: Secondary | ICD-10-CM | POA: Diagnosis not present

## 2021-04-01 DIAGNOSIS — E1122 Type 2 diabetes mellitus with diabetic chronic kidney disease: Secondary | ICD-10-CM | POA: Diagnosis not present

## 2021-04-01 DIAGNOSIS — I5032 Chronic diastolic (congestive) heart failure: Secondary | ICD-10-CM | POA: Diagnosis not present

## 2021-04-01 DIAGNOSIS — E1142 Type 2 diabetes mellitus with diabetic polyneuropathy: Secondary | ICD-10-CM | POA: Diagnosis not present

## 2021-04-01 DIAGNOSIS — N182 Chronic kidney disease, stage 2 (mild): Secondary | ICD-10-CM | POA: Diagnosis not present

## 2021-04-01 DIAGNOSIS — R519 Headache, unspecified: Secondary | ICD-10-CM | POA: Diagnosis not present

## 2021-04-01 DIAGNOSIS — J449 Chronic obstructive pulmonary disease, unspecified: Secondary | ICD-10-CM | POA: Diagnosis not present

## 2021-04-01 DIAGNOSIS — E1165 Type 2 diabetes mellitus with hyperglycemia: Secondary | ICD-10-CM | POA: Diagnosis not present

## 2021-04-02 DIAGNOSIS — E1151 Type 2 diabetes mellitus with diabetic peripheral angiopathy without gangrene: Secondary | ICD-10-CM | POA: Diagnosis not present

## 2021-04-02 DIAGNOSIS — I130 Hypertensive heart and chronic kidney disease with heart failure and stage 1 through stage 4 chronic kidney disease, or unspecified chronic kidney disease: Secondary | ICD-10-CM | POA: Diagnosis not present

## 2021-04-02 DIAGNOSIS — C50511 Malignant neoplasm of lower-outer quadrant of right female breast: Secondary | ICD-10-CM | POA: Diagnosis not present

## 2021-04-02 DIAGNOSIS — R519 Headache, unspecified: Secondary | ICD-10-CM | POA: Diagnosis not present

## 2021-04-02 DIAGNOSIS — E1142 Type 2 diabetes mellitus with diabetic polyneuropathy: Secondary | ICD-10-CM | POA: Diagnosis not present

## 2021-04-02 DIAGNOSIS — G40909 Epilepsy, unspecified, not intractable, without status epilepticus: Secondary | ICD-10-CM | POA: Diagnosis not present

## 2021-04-02 DIAGNOSIS — E1122 Type 2 diabetes mellitus with diabetic chronic kidney disease: Secondary | ICD-10-CM | POA: Diagnosis not present

## 2021-04-02 DIAGNOSIS — E1165 Type 2 diabetes mellitus with hyperglycemia: Secondary | ICD-10-CM | POA: Diagnosis not present

## 2021-04-02 DIAGNOSIS — E11622 Type 2 diabetes mellitus with other skin ulcer: Secondary | ICD-10-CM | POA: Diagnosis not present

## 2021-04-02 DIAGNOSIS — F319 Bipolar disorder, unspecified: Secondary | ICD-10-CM | POA: Diagnosis not present

## 2021-04-02 DIAGNOSIS — L89622 Pressure ulcer of left heel, stage 2: Secondary | ICD-10-CM | POA: Diagnosis not present

## 2021-04-02 DIAGNOSIS — J449 Chronic obstructive pulmonary disease, unspecified: Secondary | ICD-10-CM | POA: Diagnosis not present

## 2021-04-02 DIAGNOSIS — N182 Chronic kidney disease, stage 2 (mild): Secondary | ICD-10-CM | POA: Diagnosis not present

## 2021-04-02 DIAGNOSIS — I2510 Atherosclerotic heart disease of native coronary artery without angina pectoris: Secondary | ICD-10-CM | POA: Diagnosis not present

## 2021-04-02 DIAGNOSIS — I69334 Monoplegia of upper limb following cerebral infarction affecting left non-dominant side: Secondary | ICD-10-CM | POA: Diagnosis not present

## 2021-04-02 DIAGNOSIS — I5032 Chronic diastolic (congestive) heart failure: Secondary | ICD-10-CM | POA: Diagnosis not present

## 2021-04-05 DIAGNOSIS — E1142 Type 2 diabetes mellitus with diabetic polyneuropathy: Secondary | ICD-10-CM | POA: Diagnosis not present

## 2021-04-05 DIAGNOSIS — I69334 Monoplegia of upper limb following cerebral infarction affecting left non-dominant side: Secondary | ICD-10-CM | POA: Diagnosis not present

## 2021-04-05 DIAGNOSIS — I130 Hypertensive heart and chronic kidney disease with heart failure and stage 1 through stage 4 chronic kidney disease, or unspecified chronic kidney disease: Secondary | ICD-10-CM | POA: Diagnosis not present

## 2021-04-05 DIAGNOSIS — E11622 Type 2 diabetes mellitus with other skin ulcer: Secondary | ICD-10-CM | POA: Diagnosis not present

## 2021-04-05 DIAGNOSIS — R519 Headache, unspecified: Secondary | ICD-10-CM | POA: Diagnosis not present

## 2021-04-05 DIAGNOSIS — E1165 Type 2 diabetes mellitus with hyperglycemia: Secondary | ICD-10-CM | POA: Diagnosis not present

## 2021-04-05 DIAGNOSIS — L89622 Pressure ulcer of left heel, stage 2: Secondary | ICD-10-CM | POA: Diagnosis not present

## 2021-04-06 ENCOUNTER — Ambulatory Visit: Payer: Medicare Other | Admitting: Physician Assistant

## 2021-04-06 DIAGNOSIS — E11622 Type 2 diabetes mellitus with other skin ulcer: Secondary | ICD-10-CM | POA: Diagnosis not present

## 2021-04-06 DIAGNOSIS — N182 Chronic kidney disease, stage 2 (mild): Secondary | ICD-10-CM | POA: Diagnosis not present

## 2021-04-06 DIAGNOSIS — J449 Chronic obstructive pulmonary disease, unspecified: Secondary | ICD-10-CM | POA: Diagnosis not present

## 2021-04-06 DIAGNOSIS — E1151 Type 2 diabetes mellitus with diabetic peripheral angiopathy without gangrene: Secondary | ICD-10-CM | POA: Diagnosis not present

## 2021-04-06 DIAGNOSIS — E1165 Type 2 diabetes mellitus with hyperglycemia: Secondary | ICD-10-CM | POA: Diagnosis not present

## 2021-04-06 DIAGNOSIS — G40909 Epilepsy, unspecified, not intractable, without status epilepticus: Secondary | ICD-10-CM | POA: Diagnosis not present

## 2021-04-06 DIAGNOSIS — L89622 Pressure ulcer of left heel, stage 2: Secondary | ICD-10-CM | POA: Diagnosis not present

## 2021-04-06 DIAGNOSIS — F319 Bipolar disorder, unspecified: Secondary | ICD-10-CM | POA: Diagnosis not present

## 2021-04-06 DIAGNOSIS — I2510 Atherosclerotic heart disease of native coronary artery without angina pectoris: Secondary | ICD-10-CM | POA: Diagnosis not present

## 2021-04-06 DIAGNOSIS — I69334 Monoplegia of upper limb following cerebral infarction affecting left non-dominant side: Secondary | ICD-10-CM | POA: Diagnosis not present

## 2021-04-06 DIAGNOSIS — E1142 Type 2 diabetes mellitus with diabetic polyneuropathy: Secondary | ICD-10-CM | POA: Diagnosis not present

## 2021-04-06 DIAGNOSIS — R519 Headache, unspecified: Secondary | ICD-10-CM | POA: Diagnosis not present

## 2021-04-06 DIAGNOSIS — I130 Hypertensive heart and chronic kidney disease with heart failure and stage 1 through stage 4 chronic kidney disease, or unspecified chronic kidney disease: Secondary | ICD-10-CM | POA: Diagnosis not present

## 2021-04-06 DIAGNOSIS — E1122 Type 2 diabetes mellitus with diabetic chronic kidney disease: Secondary | ICD-10-CM | POA: Diagnosis not present

## 2021-04-06 DIAGNOSIS — I5032 Chronic diastolic (congestive) heart failure: Secondary | ICD-10-CM | POA: Diagnosis not present

## 2021-04-06 DIAGNOSIS — C50511 Malignant neoplasm of lower-outer quadrant of right female breast: Secondary | ICD-10-CM | POA: Diagnosis not present

## 2021-04-07 ENCOUNTER — Other Ambulatory Visit: Payer: Self-pay

## 2021-04-07 ENCOUNTER — Ambulatory Visit (INDEPENDENT_AMBULATORY_CARE_PROVIDER_SITE_OTHER): Payer: BC Managed Care – PPO | Admitting: Primary Care

## 2021-04-07 ENCOUNTER — Inpatient Hospital Stay: Payer: Medicare Other

## 2021-04-07 ENCOUNTER — Encounter: Payer: Self-pay | Admitting: Primary Care

## 2021-04-07 ENCOUNTER — Inpatient Hospital Stay: Payer: Medicare Other | Admitting: Internal Medicine

## 2021-04-07 VITALS — BP 124/62 | HR 74 | Temp 97.8°F | Ht 64.0 in | Wt 237.0 lb

## 2021-04-07 DIAGNOSIS — J449 Chronic obstructive pulmonary disease, unspecified: Secondary | ICD-10-CM

## 2021-04-07 DIAGNOSIS — R918 Other nonspecific abnormal finding of lung field: Secondary | ICD-10-CM

## 2021-04-07 DIAGNOSIS — J9611 Chronic respiratory failure with hypoxia: Secondary | ICD-10-CM

## 2021-04-07 DIAGNOSIS — G43709 Chronic migraine without aura, not intractable, without status migrainosus: Secondary | ICD-10-CM

## 2021-04-07 DIAGNOSIS — L89629 Pressure ulcer of left heel, unspecified stage: Secondary | ICD-10-CM | POA: Diagnosis not present

## 2021-04-07 DIAGNOSIS — I2510 Atherosclerotic heart disease of native coronary artery without angina pectoris: Secondary | ICD-10-CM

## 2021-04-07 DIAGNOSIS — R300 Dysuria: Secondary | ICD-10-CM

## 2021-04-07 LAB — POC URINALSYSI DIPSTICK (AUTOMATED)
Bilirubin, UA: NEGATIVE
Glucose, UA: POSITIVE — AB
Ketones, UA: NEGATIVE
Leukocytes, UA: NEGATIVE
Nitrite, UA: NEGATIVE
Protein, UA: POSITIVE — AB
Spec Grav, UA: 1.015 (ref 1.010–1.025)
Urobilinogen, UA: 0.2 E.U./dL
pH, UA: 7 (ref 5.0–8.0)

## 2021-04-07 MED ORDER — TOPIRAMATE 50 MG PO TABS: 50.0000 mg | ORAL_TABLET | Freq: Two times a day (BID) | ORAL | 1 refills | Status: DC

## 2021-04-07 NOTE — Assessment & Plan Note (Signed)
Reviewed recent CT chest from oncology. No change in nodules.  Continue to monitor.

## 2021-04-07 NOTE — Assessment & Plan Note (Signed)
Improved somewhat with Topamax 50 mg.  Will increase dose to BID as she feels medication wearing off around 1 to 2 pm. Drowsiness precautions provided.   She will update.   Neurology notes from August 2022 reviewed.

## 2021-04-07 NOTE — Patient Instructions (Signed)
We increased your dose of Topamax to twice daily. Take this at bedtime and around 1 or 2 pm. This may cause drowsiness.  Stop taking Excedrin Migraine if possible, or limit use to only if needed.  I will be in touch with your urine test on Friday.  We will see you in three months.   It was a pleasure to see you today!

## 2021-04-07 NOTE — Progress Notes (Signed)
Subjective:    Patient ID: [name], female    DOB: [birth year], 50 y.o.   MRN: [id number]  HPI  Debbera Wolken is a very pleasant 50 y.o. female with a significant medical history including COPD, chronic respiratory failure, CHF, uncontrolled type 2 diabetes, sleep apnea, PAD, migraines, tobacco dependence, Bipolar disorder who presents today for follow up.  1) Migraines: Chronic and intermittent since teenage years. Last evaluated for migraines on 03/05/21, Topamax 50 mg was prescribed for migraine prevention.   Since her last visit she was evaluated by Neurology three days later (03/08/21) who recommended to continue Topamax 19m prescribed by [place] reduce Excedrin Migraine medication as this was likely causing rebound headaches, start Ubrevly for migraine abortion.   Today she endorses she's been out of her Topamax 50 mg for about one week. She noticed improvement in headache frequency. The [place]athrin Ruddywas not covered by her insurance.   2) Decubitus Ulcer: To right heel, since July 2022, following with wound management, last visit was 03/17/21. During this visit she was provided with cleansing and dressing instructions and was advised to return three weeks later.   3) COPD/Tobacco Abuse/Chronic Respiratory Failure: Managed on   CT chest in late August 2022 with extensive ground-glass nodularity, similar to prior exam. Also several other unchanged nodules throughout lungs.   Managed on Symbicort 80-4.5 mcg inhaler, she is compliant to 2 puffs twice daily. She is active with physical therapy 3-4 times weekly, has found this very effective, has been regaining strength and endurance. She is wearing oxygen at night. Follows with pulmonology.   4) Dysuria: Symptoms began about one month ago. She started taking Doxycycline about one week ago, took for a 3-4 days, no improvement in symptoms. History of cystitis. Last confirmed case of cystitis was 03/05/21, culture positive Klebsiella pneumoniae.    She drinks a lot of water at home. She does not use a vaginal moisturizer, doesn't feel dry. She loses control of her urine daily, also in at night. She is doing Kegal exercises some.   Review of Systems  Constitutional:  Negative for fever.  Genitourinary:  Positive for dysuria. Negative for frequency and vaginal discharge.        Past Medical History:  Diagnosis Date   Anxiety    takes Prozac daily   Anxiety    Aortic valve calcification    Asthma    Advair and Spirva daily   Asthma    Bipolar disorder (HCC)    Breast cancer, right breast ([facility] 12/23/2019   CAD (coronary artery disease)    a. LHC 11/2013 done for CP/fluid retention: mild disease in prox LAD, mild-mod disease in mRCA, EF 60% with normal LVEDP. b. Normal nuc 03/2016.   Cancer ([facility]    Cellulitis and abscess of trunk 03/12/2020   CHF (congestive heart failure) (HCC)    Chronic diastolic CHF (congestive heart failure) (HCC)    Chronic heart failure with preserved ejection fraction ([place] 11/16/2018   CKD (chronic kidney disease), stage II    COPD (chronic obstructive pulmonary disease) (HCC)    a. nocturnal O2.   COPD (chronic obstructive pulmonary disease) (HCC)    Coronary artery disease    Decreased urine stream    Diabetes mellitus    Diabetes mellitus without complication (HCC)    Dyspnea    Elevated C-reactive protein (CRP) 01/29/2018   Elevated sedimentation rate 01/29/2018   Elevated serum hCG 01/19/2018   Family history of adverse reaction to anesthesia  mom gets nauseated   Foot pain, bilateral 05/22/2018   GERD (gastroesophageal reflux disease)    takes Pepcid daily   History of blood clots    left leg 3-87yr ago   Hyperlipidemia    Hypertension    Hypertriglyceridemia    Inguinal hernia, left 01/2015   Muscle spasm    Open wound of genital labia    Peripheral neuropathy    RBBB    Seizures ([facility]    Sepsis ([facility] 01/19/2018   Sinus tachycardia    a. persistent since 2009.   Sm[place]' cough  ([place]    Status post mastectomy, right 01/14/2020   Stroke ([facility] 1989   left sided weakness   TIA (transient ischemic attack)    Tobacco abuse    Vulvar abscess 01/23/2018    Social History   Socioeconomic History   Marital status: Married    Spouse name: Not on file   Number of children: 2   Years of education: Not on file   Highest education level: Not on file  Occupational History   Not on file  Tobacco Use   Smoking status: Some Days    Packs/day: 0.25    Years: 36.00    Pack years: 9.00    Types: Cigarettes    Start date: 07/11/1981   Smokeless tobacco: Never   Tobacco comments:    1 ppd now  Vaping Use   Vaping Use: Some days  Substance and Sexual Activity   Alcohol use: No   Drug use: No   Sexual activity: Not Currently  Other Topics Concern   Not on file  Social History Narrative   ** Merged History Encounter **       [place]  Married but lives with Boyfriend - not legally separated   Disabled - for BiPolar, Seizure disorder, diabetes   Formerly worked at [employer] ppd; no alcohol. 2 step sons; no biologic children.       Social Determinants of HRadio broadcast assistantStrain: Not on file  Food Insecurity: No Food Insecurity   Worried About RCharity fundraiserin the Last Year: Never true   Ran Out of Food in the Last Year: Never true  Transportation Needs: Not on file  Physical Activity: Not on file  Stress: Not on file  Social Connections: Not on file  Intimate Partner Violence: Not on file    Past Surgical History:  Procedure Laterality Date   APPLICATION OF WOUND VAC Right 01/29/2020   Procedure: APPLICATION OF WOUND VAC;  Surgeon: RRonny Bacon MD;  Location: [facility];  Service: General;  Laterality: Right;   BREAST BIOPSY Right 11/06/2019   u[place] path pending venus clip   HERNIA REPAIR Left    INCISION AND DRAINAGE ABSCESS N/A 01/26/2018   Procedure: INCISION AND DEBRIDEMENT OF VULVAR NECROTIZING SOFT TISSUE  INFECTION;  Surgeon: WGreer Pickerel MD;  [place]  Service: General;  Laterality: N/A;   INCISION AND DRAINAGE PERIRECTAL ABSCESS N/A 01/22/2018   Procedure: IRRIGATION AND DEBRIDEMENT LABIAL/VULVAR AREA;  Surgeon: BCoralie Keens MD;  [place]  Service: General;  Laterality: N/A;   INCISION AND DRAINAGE PERIRECTAL ABSCESS N/A 01/29/2018   Procedure: IRRIGATION AND DEBRIDEMENT VULVA;  Surgeon: HExcell Seltzer MD;  [place]  Service: General;  Laterality: N/A;   INGUINAL HERNIA REPAIR Left 04/08/2015   Procedure: OPEN LEFT INGUINAL HERNIA REPAIR WITH MESH;  Surgeon: ARalene Ok  MD;  Location: [place];  Service: General;  Laterality: Left;   INSERTION OF MESH Left 04/08/2015   Procedure: INSERTION OF MESH;  Surgeon: Ralene Ok, MD;  Location: [place];  Service: General;  Laterality: Left;   LAPAROSCOPY     Endometriosis   LEFT HEART CATHETERIZATION WITH CORONARY ANGIOGRAM N/A 12/05/2013   Procedure: LEFT HEART CATHETERIZATION WITH CORONARY ANGIOGRAM;  Surgeon: Jettie Booze, MD;  Location: [facility] CATH LAB;  Service: Cardiovascular;  Laterality: N/A;   MASTECTOMY Right    right kidney drained     SIMPLE MASTECTOMY WITH AXILLARY SENTINEL NODE BIOPSY Right 12/23/2019   Procedure: Right SIMPLE MASTECTOMY WITH AXILLARY SENTINEL NODE BIOPSY;  Surgeon: Ronny Bacon, MD;  Location: [facility];  Service: General;  Laterality: Right;   TEE WITHOUT CARDIOVERSION N/A 11/28/2013   Procedure: TRANSESOPHAGEAL ECHOCARDIOGRAM (TEE);  Surgeon: Thayer Headings, MD;  Location: [place];  Service: Cardiovascular;  Laterality: N/A;   WOUND DEBRIDEMENT Right 01/29/2020   Procedure: DEBRIDEMENT WOUND, Excisional debridement skin, subcutaneous and muscle right chest wall;  Surgeon: Ronny Bacon, MD;  Location: [facility];  Service: General;  Laterality: Right;    Family History  Problem Relation Age of Onset   Venous thrombosis Brother    Other Brother        BRAIN TUMOR    Asthma Father    Diabetes Father    Coronary artery disease Mother    Hypertension Mother    Diabetes Mother    Breast cancer Mother 26   Asthma Sister    Diabetes type II Brother    Diabetes Brother     Allergies  Allergen Reactions   Adhesive [Tape] Rash and Other (See Comments)    TAKES OFF THE SKIN (CERTAIN MEDICAL TAPES DO THIS!!)   Metoprolol Shortness Of Breath    Occurrence of shortness of breath after 3 days   Montelukast Shortness Of Breath   Morphine Sulfate Anaphylaxis, Shortness Of Breath and Nausea And Vomiting    Swollen Throat - Able to tolerate dilaudid   Penicillins Anaphylaxis, Hives and Shortness Of Breath    Throat swells Has patient had a PCN reaction causing immediate rash, facial/tongue/throat swelling, SOB or lightheadedness with hypotension: Yes Has patient had a PCN reaction causing severe rash involving mucus membranes or skin necrosis: No Has patient had a PCN reaction that required hospitalization: Yes Has patient had a PCN reaction occurring within the last 10 years: No If all of the above answers are "NO", then may proceed with Cephalosporin use.    Diltiazem Swelling   Gabapentin Swelling   Midodrine     Lightheaded and falling down    Current Outpatient Medications on File Prior to Visit  Medication Sig Dispense Refill   albuterol (VENTOLIN HFA) 108 (90 Base) MCG/ACT inhaler Inhale 2 puffs into the lungs every 6 (six) hours as needed for wheezing or shortness of breath.     ALPRAZolam (XANAX) 1 MG tablet Take 1 mg by mouth 4 (four) times daily as needed for anxiety.      anastrozole (ARIMIDEX) 1 MG tablet Take 1 tablet (1 mg total) by mouth daily. 30 tablet 3   aspirin EC 81 MG tablet Take 81 mg by mouth daily before breakfast.     blood glucose meter kit and supplies Use up to four times daily as directed. (FOR ICD-10 E10.9, E11.9). 1 each 0   budesonide-formoterol (SYMBICORT) 80-4.5 MCG/ACT inhaler INHALE 2 PUFFS BY MOUTH EVERY 12 HOURS TO  PREVENT  COUGH OR WHEEZING *RINSE MOUTH AFTER EACH USE* 10.2 g 2   busPIRone (BUSPAR) 30 MG tablet Take 30 mg by mouth 2 (two) times daily.     citalopram (CELEXA) 20 MG tablet Take 1 tablet (20 mg total) by mouth daily. 30 tablet 2   desvenlafaxine (PRISTIQ) 100 MG 24 hr tablet Take 100 mg by mouth daily.     Desvenlafaxine ER 100 MG TB24 Take 1 tablet by mouth every morning.     diclofenac Sodium (VOLTAREN) 1 % GEL Apply 2 g topically 3 (three) times daily as needed. For pain. 100 g 0   Empagliflozin-metFORMIN HCl ER 25-1000 MG TB24 Take 1 tablet by mouth daily with breakfast.     EYLEA 2 MG/0.05ML SOSY      glucose blood test strip Use as instructed 300 each 0   Insulin Pen Needle 32G X 4 MM MISC Used to give insulin injections twice daily. 100 each 11   insulin regular human CONCENTRATED (HUMULIN R) 500 UNIT/ML injection Inject 30 Units into the skin 3 (three) times daily with meals. 30 U BID     Insulin Syringe-Needle U-100 (GLOBAL INJECT EASE INSULIN SYR) 30G X 1/2" 1 ML MISC See admin instructions.     mupirocin ointment (BACTROBAN) 2 % Apply 1 application topically 2 (two) times daily. Apply to the affected area 2 times a day 22 g 3   OXYGEN Inhale 2-4 L into the lungs 2 (two) times daily as needed (shortness of breath).      pregabalin (LYRICA) 300 MG capsule Take 1 capsule (300 mg total) by mouth 2 (two) times daily. For nerve pain. 180 capsule 0   ReliOn Ultra Thin Lancets 30G MISC Use as directed for blood sugar checks. 300 each 0   rOPINIRole (REQUIP) 1 MG tablet TAKE 1 TABLET BY MOUTH ONCE DAILY AT BEDTIME FOR  RESTLESS  LEGS 90 tablet 0   rosuvastatin (CRESTOR) 20 MG tablet Take 1 tablet (20 mg total) by mouth daily. For cholesterol. 90 tablet 3   ULTICARE MINI PEN NEEDLES 31G X 6 MM MISC USE AS DIRECTED 3 TIMES DAILY 100 each 0   XIGDUO XR 5-500 MG TB24 Take 1 tablet by mouth every morning.     FLUoxetine (PROZAC) 40 MG capsule Take 40 mg by mouth daily. (Patient not taking:  Reported on 04/07/2021)     spironolactone (ALDACTONE) 50 MG tablet Take 50 mg by mouth daily.     traZODone (DESYREL) 100 MG tablet Take 200 mg by mouth at bedtime.  (Patient not taking: Reported on 04/07/2021)     No current facility-administered medications on file prior to visit.    BP 124/62   Pulse 74   Temp 97.8 F (36.6 C) (Temporal)   Ht 5' 4"  (1.626 m)   Wt 237 lb (107.5 kg)   LMP  (LMP Unknown)   SpO2 95%   BMI 40.68 kg/m  Objective:   Physical Exam Cardiovascular:     Rate and Rhythm: Normal rate and regular rhythm.  Pulmonary:     Effort: Pulmonary effort is normal.     Breath sounds: Normal breath sounds.  Musculoskeletal:     Cervical back: Neck supple.  Skin:    General: Skin is warm and dry.          Assessment & Plan:      This visit occurred during the SARS-CoV-2 public health emergency.  Safety protocols were in place, including screening questions prior to the visit,  additional usage of staff PPE, and extensive cleaning of exam room while observing appropriate contact time as indicated for disinfecting solutions.

## 2021-04-07 NOTE — Addendum Note (Signed)
Addended by: Francella Solian on: 04/07/2021 09:15 AM   Modules accepted: Orders

## 2021-04-07 NOTE — Assessment & Plan Note (Signed)
Acute for the last month.  UA today with trace blood, 3+ glucose. No leuks. Culture sent.  Continue Kegal exercises.

## 2021-04-07 NOTE — Assessment & Plan Note (Signed)
Follows with pulmonology, continue night time oxygen.

## 2021-04-07 NOTE — Assessment & Plan Note (Signed)
Improving. Following with wound management.

## 2021-04-07 NOTE — Assessment & Plan Note (Signed)
Appears stable. Continue Symbicort 80-4.5 mcg 2 puffs BID. Continue night time oxygen.  Reviewed recent CT chest from oncology.

## 2021-04-07 NOTE — Chronic Care Management (AMB) (Signed)
  Care Management   Note  04/07/2021 Name: Jadeyn Hargett MRN: [id number] DOB: 1971-06-17  Ahniya Mitchum is a 50 y.o. year old female who is a primary care patient of Pleas Koch, NP and is actively engaged with the care management team. I reached out to [facility] by phone today to assist with re-scheduling an initial visit with the Licensed Clinical Social Worker  Follow up plan: Telephone appointment with care management team member scheduled for: 04/26/2021  Julian Hy, [place], [employer]  Direct Dial: [phone]

## 2021-04-08 ENCOUNTER — Encounter: Payer: BC Managed Care – PPO | Admitting: Physician Assistant

## 2021-04-08 DIAGNOSIS — Z880 Allergy status to penicillin: Secondary | ICD-10-CM | POA: Diagnosis not present

## 2021-04-08 DIAGNOSIS — E11622 Type 2 diabetes mellitus with other skin ulcer: Secondary | ICD-10-CM | POA: Diagnosis not present

## 2021-04-08 DIAGNOSIS — Z8673 Personal history of transient ischemic attack (TIA), and cerebral infarction without residual deficits: Secondary | ICD-10-CM | POA: Diagnosis not present

## 2021-04-08 DIAGNOSIS — Z86718 Personal history of other venous thrombosis and embolism: Secondary | ICD-10-CM | POA: Diagnosis not present

## 2021-04-08 DIAGNOSIS — J449 Chronic obstructive pulmonary disease, unspecified: Secondary | ICD-10-CM | POA: Diagnosis not present

## 2021-04-08 DIAGNOSIS — L03116 Cellulitis of left lower limb: Secondary | ICD-10-CM | POA: Diagnosis not present

## 2021-04-08 DIAGNOSIS — L89623 Pressure ulcer of left heel, stage 3: Secondary | ICD-10-CM | POA: Diagnosis not present

## 2021-04-08 DIAGNOSIS — E1142 Type 2 diabetes mellitus with diabetic polyneuropathy: Secondary | ICD-10-CM | POA: Diagnosis not present

## 2021-04-08 DIAGNOSIS — R569 Unspecified convulsions: Secondary | ICD-10-CM | POA: Diagnosis not present

## 2021-04-08 DIAGNOSIS — I110 Hypertensive heart disease with heart failure: Secondary | ICD-10-CM | POA: Diagnosis not present

## 2021-04-08 DIAGNOSIS — D0581 Other specified type of carcinoma in situ of right breast: Secondary | ICD-10-CM | POA: Diagnosis not present

## 2021-04-08 DIAGNOSIS — F17200 Nicotine dependence, unspecified, uncomplicated: Secondary | ICD-10-CM | POA: Diagnosis not present

## 2021-04-08 DIAGNOSIS — I5032 Chronic diastolic (congestive) heart failure: Secondary | ICD-10-CM | POA: Diagnosis not present

## 2021-04-08 DIAGNOSIS — I872 Venous insufficiency (chronic) (peripheral): Secondary | ICD-10-CM | POA: Diagnosis not present

## 2021-04-08 DIAGNOSIS — Z8249 Family history of ischemic heart disease and other diseases of the circulatory system: Secondary | ICD-10-CM | POA: Diagnosis not present

## 2021-04-08 DIAGNOSIS — Z823 Family history of stroke: Secondary | ICD-10-CM | POA: Diagnosis not present

## 2021-04-08 LAB — URINE CULTURE
MICRO NUMBER:: 12434463
SPECIMEN QUALITY:: ADEQUATE

## 2021-04-08 NOTE — Progress Notes (Signed)
[name], [name] ([id number]) Visit Report for 04/08/2021 Arrival Information Details Patient Name: [name] Date of Service: 04/08/2021 10:00 AM Medical Record Number: [id number] Patient Account Number: [id number] Date of Birth/Sex: Nov 20, 1970 (50 y.o. F) Treating RN: Dolan Amen Primary Care Meighan Treto: Alma Friendly Other Clinician: Referring Eliyanna Ault: Alma Friendly Treating Aviv Lengacher/Extender: Skipper Cliche in Treatment: 9 Visit Information History Since Last Visit Has Dressing in Place as Prescribed: Yes Patient Arrived: Ambulatory Pain Present Now: No Arrival Time: 10:15 Accompanied By: husband Transfer Assistance: None Patient Identification Verified: Yes Secondary Verification Process Completed: Yes Patient Has Alerts: Yes Patient Alerts: 325 aspirin Type II Diabetic Electronic Signature(s) Signed: 04/08/2021 4:16:02 PM By: Dolan Amen RN Entered By: Dolan Amen on 04/08/2021 10:16:00 [name] ([id number]) -------------------------------------------------------------------------------- Clinic Level of Care Assessment Details Patient Name: [name] Date of Service: 04/08/2021 10:00 AM Medical Record Number: [id number] Patient Account Number: [id number] Date of Birth/Sex: [birth year] (50 y.o. F) Treating RN: Dolan Amen Primary Care Melaina Howerton: Alma Friendly Other Clinician: Referring Divit Stipp: Alma Friendly Treating Lyncoln Maskell/Extender: Skipper Cliche in Treatment: 9 Clinic Level of Care Assessment Items TOOL 1 Quantity Score []  - Use when EandM and Procedure is performed on INITIAL visit 0 ASSESSMENTS - Nursing Assessment / Reassessment []  - General Physical Exam (combine w/ comprehensive assessment (listed just below) when performed on new 0 pt. evals) []  - 0 Comprehensive Assessment (HX, ROS, Risk Assessments, Wounds Hx, etc.) ASSESSMENTS - Wound and Skin Assessment / Reassessment []  - Dermatologic / Skin Assessment (not  related to wound area) 0 ASSESSMENTS - Ostomy and/or Continence Assessment and Care []  - Incontinence Assessment and Management 0 []  - 0 Ostomy Care Assessment and Management (repouching, etc.) PROCESS - Coordination of Care []  - Simple Patient / Family Education for ongoing care 0 []  - 0 Complex (extensive) Patient / Family Education for ongoing care []  - 0 Staff obtains Programmer, systems, Records, Test Results / Process Orders []  - 0 Staff telephones HHA, Nursing Homes / Clarify orders / etc []  - 0 Routine Transfer to another Facility (non-emergent condition) []  - 0 Routine Hospital Admission (non-emergent condition) []  - 0 New Admissions / Biomedical engineer / Ordering NPWT, Apligraf, etc. []  - 0 Emergency Hospital Admission (emergent condition) PROCESS - Special Needs []  - Pediatric / Minor Patient Management 0 []  - 0 Isolation Patient Management []  - 0 Hearing / Language / Visual special needs []  - 0 Assessment of Community assistance (transportation, D/C planning, etc.) []  - 0 Additional assistance / Altered mentation []  - 0 Support Surface(s) Assessment (bed, cushion, seat, etc.) INTERVENTIONS - Miscellaneous []  - External ear exam 0 []  - 0 Patient Transfer (multiple staff / Civil Service fast streamer / Similar devices) []  - 0 Simple Staple / Suture removal (25 or less) []  - 0 Complex Staple / Suture removal (26 or more) []  - 0 Hypo/Hyperglycemic Management (do not check if billed separately) []  - 0 Ankle / Brachial Index (ABI) - do not check if billed separately Has the patient been seen at the hospital within the last three years: Yes Total Score: 0 Level Of Care: ____ [name] ([id number]) Electronic Signature(s) Signed: 04/08/2021 4:16:02 PM By: Dolan Amen RN Entered By: Dolan Amen on 04/08/2021 10:45:49 Heinsohn, [name] ([id number]) -------------------------------------------------------------------------------- Encounter Discharge Information  Details Patient Name: [name] Date of Service: 04/08/2021 10:00 AM Medical Record Number: [id number] Patient Account Number: [id number] Date of Birth/Sex: 01/13/1971 (50 y.o. F) Treating RN: Dolan Amen Primary Care Anorah Trias: Alma Friendly Other Clinician: Referring Danaly Bari: Carlis Abbott,  Katherine Treating Dashton Czerwinski/Extender: Skipper Cliche in Treatment: 9 Encounter Discharge Information Items Post Procedure Vitals Discharge Condition: Stable Temperature (F): 98.0 Ambulatory Status: Ambulatory Pulse (bpm): 79 Discharge Destination: Home Respiratory Rate (breaths/min): 16 Transportation: Private Auto Blood Pressure (mmHg): 146/78 Accompanied By: husband Schedule Follow-up Appointment: Yes Clinical Summary of Care: Electronic Signature(s) Signed: 04/08/2021 4:16:02 PM By: Dolan Amen RN Entered By: Dolan Amen on 04/08/2021 10:46:44 Kallal, [name] ([id number]) -------------------------------------------------------------------------------- Lower Extremity Assessment Details Patient Name: [name] Date of Service: 04/08/2021 10:00 AM Medical Record Number: [id number] Patient Account Number: [id number] Date of Birth/Sex: 01-09-1971 (50 y.o. F) Treating RN: Dolan Amen Primary Care Maribelle Hopple: Alma Friendly Other Clinician: Referring Khalee Mazo: Alma Friendly Treating Kameisha Malicki/Extender: Jeri Cos Weeks in Treatment: 9 Edema Assessment Assessed: [Left: Yes] [Right: No] Edema: [Left: N] [Right: o] Vascular Assessment Pulses: Dorsalis Pedis Palpable: [Left:Yes] Electronic Signature(s) Signed: 04/08/2021 4:16:02 PM By: Dolan Amen RN Entered By: Dolan Amen on 04/08/2021 10:22:13 Masley, [name] ([id number]) -------------------------------------------------------------------------------- Multi Wound Chart Details Patient Name: [name] Date of Service: 04/08/2021 10:00 AM Medical Record Number: [id number] Patient Account Number:  [id number] Date of Birth/Sex: [birth year] (50 y.o. F) Treating RN: Dolan Amen Primary Care Lorrie Gargan: Alma Friendly Other Clinician: Referring Halden Phegley: Alma Friendly Treating Leandro Berkowitz/Extender: Skipper Cliche in Treatment: 9 Vital Signs Height(in): 64 Pulse(bpm): 43 Weight(lbs): 242 Blood Pressure(mmHg): 146/78 Body Mass Index(BMI): 42 Temperature(F): 98.0 Respiratory Rate(breaths/min): 16 Photos: [N/A:N/A] Wound Location: Left Calcaneus N/A N/A Wounding Event: Blister N/A N/A Primary Etiology: Pressure Ulcer N/A N/A Comorbid History: Asthma, Chronic Obstructive N/A N/A Pulmonary Disease (COPD), Sleep Apnea, Arrhythmia, Congestive Heart Failure, Coronary Artery Disease, Hypertension, Type II Diabetes, Neuropathy, Received Chemotherapy, Received Radiation Date Acquired: 01/13/2021 N/A N/A Weeks of Treatment: 9 N/A N/A Wound Status: Open N/A N/A Measurements L x W x D (cm) 0.7x0.7x0.1 N/A N/A Area (cm) : 0.385 N/A N/A Volume (cm) : 0.038 N/A N/A % Reduction in Area: 95.90% N/A N/A % Reduction in Volume: 96.00% N/A N/A Classification: Category/Stage III N/A N/A Exudate Amount: Small N/A N/A Exudate Type: Sanguinous N/A N/A Exudate Color: red N/A N/A Granulation Amount: Large (67-100%) N/A N/A Granulation Quality: Red, Pink N/A N/A Necrotic Amount: Small (1-33%) N/A N/A Exposed Structures: Fat Layer (Subcutaneous Tissue): N/A N/A Yes Fascia: No Tendon: No Muscle: No Joint: No Bone: No Epithelialization: Large (67-100%) N/A N/A Treatment Notes Electronic Signature(s) Signed: 04/08/2021 4:16:02 PM By: Dolan Amen RN Entered By: Dolan Amen on 04/08/2021 10:28:48 Malley, H[place]er [place] ([id number]) Nevis, [name] ([id number]) -------------------------------------------------------------------------------- Multi-Disciplinary Care Plan Details Patient Name: [name] Date of Service: 04/08/2021 10:00 AM Medical Record Number: [id number] Patient Account  Number: [id number] Date of Birth/Sex: Jul 25, 1970 (50 y.o. F) Treating RN: Dolan Amen Primary Care Reylene Stauder: Alma Friendly Other Clinician: Referring Kaysea Raya: Alma Friendly Treating Judith Demps/Extender: Skipper Cliche in Treatment: 9 Active Inactive Wound/Skin Impairment Nursing Diagnoses: Impaired tissue integrity Goals: Patient/caregiver will verbalize understanding of skin care regimen Date Initiated: 02/02/2021 Date Inactivated: 03/17/2021 Target Resolution Date: 02/02/2021 Goal Status: Met Ulcer/skin breakdown will have a volume reduction of 30% by week 4 Date Initiated: 02/02/2021 Date Inactivated: 03/17/2021 Target Resolution Date: 03/05/2021 Goal Status: Met Ulcer/skin breakdown will have a volume reduction of 50% by week 8 Date Initiated: 02/02/2021 Date Inactivated: 03/17/2021 Target Resolution Date: 04/05/2021 Goal Status: Met Ulcer/skin breakdown will have a volume reduction of 80% by week 12 Date Initiated: 02/02/2021 Date Inactivated: 03/17/2021 Target Resolution Date: 05/05/2021 Goal Status: Met Ulcer/skin breakdown will heal within 14 weeks Date Initiated:  02/02/2021 Target Resolution Date: 06/05/2021 Goal Status: Active Interventions: Assess patient/caregiver ability to obtain necessary supplies Assess patient/caregiver ability to perform ulcer/skin care regimen upon admission and as needed Assess ulceration(s) every visit Provide education on ulcer and skin care Treatment Activities: Referred to DME Sherhonda Gaspar for dressing supplies : 02/02/2021 Skin care regimen initiated : 02/02/2021 Notes: Electronic Signature(s) Signed: 04/08/2021 4:16:02 PM By: Dolan Amen RN Entered By: Dolan Amen on 04/08/2021 10:27:06 Humphreys, [name] ([id number]) -------------------------------------------------------------------------------- Pain Assessment Details Patient Name: [name] Date of Service: 04/08/2021 10:00 AM Medical Record Number: [id number] Patient  Account Number: [id number] Date of Birth/Sex: [birth year] (50 y.o. F) Treating RN: Dolan Amen Primary Care Kyrstin Campillo: Alma Friendly Other Clinician: Referring Dailee Manalang: Alma Friendly Treating Hyde Sires/Extender: Skipper Cliche in Treatment: 9 Active Problems Location of Pain Severity and Description of Pain Patient Has Paino No Site Locations Pain Management and Medication Current Pain Management: Electronic Signature(s) Signed: 04/08/2021 4:16:02 PM By: Dolan Amen RN Entered By: Dolan Amen on 04/08/2021 10:19:47 [name] ([id number]) -------------------------------------------------------------------------------- Patient/Caregiver Education Details Patient Name: [name] Date of Service: 04/08/2021 10:00 AM Medical Record Number: [id number] Patient Account Number: [id number] Date of Birth/Gender: 06/12/1971 (50 y.o. F) Treating RN: Dolan Amen Primary Care Physician: Alma Friendly Other Clinician: Referring Physician: Alma Friendly Treating Physician/Extender: Skipper Cliche in Treatment: 9 Education Assessment Education Provided To: Patient Education Topics Provided Wound/Skin Impairment: Methods: Explain/Verbal Responses: State content correctly Electronic Signature(s) Signed: 04/08/2021 4:16:02 PM By: Dolan Amen RN Entered By: Dolan Amen on 04/08/2021 [place], [name] ([id number]) -------------------------------------------------------------------------------- Wound Assessment Details Patient Name: [name] Date of Service: 04/08/2021 10:00 AM Medical Record Number: [id number] Patient Account Number: [id number] Date of Birth/Sex: [birth year] (50 y.o. F) Treating RN: Dolan Amen Primary Care Aundrea Horace: Alma Friendly Other Clinician: Referring Sharnese Heath: Alma Friendly Treating Catcher Dehoyos/Extender: Skipper Cliche in Treatment: 9 Wound Status Wound Number: 9 Primary Pressure Ulcer Etiology: Wound  Location: Left Calcaneus Wound Open Wounding Event: Blister Status: Date Acquired: 01/13/2021 Comorbid Asthma, Chronic Obstructive Pulmonary Disease (COPD), Weeks Of Treatment: 9 History: Sleep Apnea, Arrhythmia, Congestive Heart Failure, Clustered Wound: No Coronary Artery Disease, Hypertension, Type II Diabetes, Neuropathy, Received Chemotherapy, Received Radiation Photos Wound Measurements Length: (cm) 0.7 Width: (cm) 0.7 Depth: (cm) 0.1 Area: (cm) 0.385 Volume: (cm) 0.038 % Reduction in Area: 95.9% % Reduction in Volume: 96% Epithelialization: Large (67-100%) Tunneling: No Undermining: No Wound Description Classification: Category/Stage III Exudate Amount: Small Exudate Type: Sanguinous Exudate Color: red Foul Odor After Cleansing: No Slough/Fibrino Yes Wound Bed Granulation Amount: Large (67-100%) Exposed Structure Granulation Quality: Red, Pink Fascia Exposed: No Necrotic Amount: Small (1-33%) Fat Layer (Subcutaneous Tissue) Exposed: Yes Necrotic Quality: Adherent Slough Tendon Exposed: No Muscle Exposed: No Joint Exposed: No Bone Exposed: No Treatment Notes Wound #9 (Calcaneus) Wound Laterality: Left Cleanser Soap and Water Discharge Instruction: Gently cleanse wound with antibacterial soap, rinse and pat dry prior to dressing wounds Peri-Wound Care Warn, Lynniah ([id number]) Topical Primary Dressing Xeroform 4x4-HBD (in/in) Discharge Instruction: Apply Xeroform 4x4-HBD (in/in) as directed Secondary Dressing Gauze Discharge Instruction: Apply dry gauze to cover xeroform Secured With Coban Cohesive Bandage 4x5 (yds) Stretched Discharge Instruction: Apply Coban as directed. Compression Wrap Compression Stockings Add-Ons Electronic Signature(s) Signed: 04/08/2021 4:16:02 PM By: Dolan Amen RN Entered By: Dolan Amen on 04/08/2021 10:28:38 SONIKA, LEVINS  ([id number]) -------------------------------------------------------------------------------- Vitals Details Patient Name: [name] Date of Service: 04/08/2021 10:00 AM Medical Record Number: [id number] Patient Account Number: [id number] Date of Birth/Sex: August 27, 1970 (50  y.o. F) Treating RN: Dolan Amen Primary Care Dalonda Simoni: Alma Friendly Other Clinician: Referring Kaileah Shevchenko: Alma Friendly Treating Kentarius Partington/Extender: Skipper Cliche in Treatment: 9 Vital Signs Time Taken: 10:16 Temperature (F): 98.0 Height (in): 64 Pulse (bpm): 79 Weight (lbs): 242 Respiratory Rate (breaths/min): 16 Body Mass Index (BMI): 41.5 Blood Pressure (mmHg): 146/78 Reference Range: 80 - 120 mg / dl Electronic Signature(s) Signed: 04/08/2021 4:16:02 PM By: Dolan Amen RN Entered By: Dolan Amen on 04/08/2021 10:16:58

## 2021-04-08 NOTE — Progress Notes (Addendum)
KLOE, OATES ([id number]) Visit Report for 04/08/2021 Chief Complaint Document Details Patient Name: [name], [name] Date of Service: 04/08/2021 10:00 AM Medical Record Number: [id number] Patient Account Number: [id number] Date of Birth/Sex: 11/09/1970 (50 y.o. F) Treating RN: Dolan Amen Primary Care Provider: Alma Friendly Other Clinician: Referring Provider: Alma Friendly Treating Provider/Extender: Skipper Cliche in Treatment: 9 Information Obtained from: Patient Chief Complaint Left Heel Ulcer Electronic Signature(s) Signed: 04/08/2021 10:24:48 AM By: Worthy Keeler PA-C Entered By: Worthy Keeler on 04/08/2021 10:24:48 [place], [name] ([id number]) -------------------------------------------------------------------------------- Debridement Details Patient Name: [name] Date of Service: 04/08/2021 10:00 AM Medical Record Number: [id number] Patient Account Number: [id number] Date of Birth/Sex: [birth year] (50 y.o. F) Treating RN: Dolan Amen Primary Care Provider: Alma Friendly Other Clinician: Referring Provider: Alma Friendly Treating Provider/Extender: Skipper Cliche in Treatment: 9 Debridement Performed for Wound #9 Left Calcaneus Assessment: Performed By: Physician Tommie Sams., PA-C Debridement Type: Debridement Level of Consciousness (Pre- Awake and Alert procedure): Pre-procedure Verification/Time Out Yes - 10:29 Taken: Start Time: 10:29 Total Area Debrided (L x W): 2 (cm) x 2 (cm) = 4 (cm) Tissue and other material Viable, Non-Viable, Callus, Slough, Subcutaneous, Skin: Epidermis, Slough debrided: Level: Skin/Subcutaneous Tissue Debridement Description: Excisional Instrument: Curette Bleeding: Moderate Hemostasis Achieved: Silver Nitrate Response to Treatment: Procedure was tolerated well Level of Consciousness (Post- Awake and Alert procedure): Post Debridement Measurements of Total Wound Length: (cm) 0.7 Stage:  Category/Stage III Width: (cm) 0.7 Depth: (cm) 0.1 Volume: (cm) 0.038 Character of Wound/Ulcer Post Debridement: Stable Post Procedure Diagnosis Same as Pre-procedure Notes 1 silver nitrate stick used Electronic Signature(s) Signed: 04/08/2021 4:16:02 PM By: Dolan Amen RN Signed: 04/08/2021 5:23:54 PM By: Worthy Keeler PA-C Entered By: Dolan Amen on 04/08/2021 10:36:21 [place], [name] ([id number]) -------------------------------------------------------------------------------- HPI Details Patient Name: [name] Date of Service: 04/08/2021 10:00 AM Medical Record Number: [id number] Patient Account Number: [id number] Date of Birth/Sex: 25-Dec-1970 (50 y.o. F) Treating RN: Dolan Amen Primary Care Provider: Alma Friendly Other Clinician: Referring Provider: Alma Friendly Treating Provider/Extender: Skipper Cliche in Treatment: 9 History of Present Illness HPI Description: ADMISSION 06/19/2019 Patient is a 50 year old woman who is accompanied by her husband. She has severe diabetic peripheral neuropathy which is left her minimally ambulatory. She spends most of the time in a wheelchair. She also has a history of chronic lower extremity edema history of left leg DVT. She states that on 2 separate occasions roughly a month ago she fell with lacerations on the bilateral anterior tibial areas. She was seen by primary care on 06/04/2019 diagnosed with cellulitis of the left leg put on Bactrim DS. Also noted to have hand wounds with a question of referral to hand surgery. She was also sent to our clinic at the same time. The patient's husband is doing the dressings. He has been using silver alginate that was supplied by home health. He is done a really good job the area on the left leg is actually healed only a small superficial area on the right Somewhere in the in the last 2 weeks she gives a bizarre history of going to wash nail polish off the fingers of both hands.  She apparently did not realize that her son had put Clorox in the vinegar jar that they used to clean the countertop. She ended up burning her hands. The patient has a wound on the PIP of the left third finger, PIP of the right second finger and the lateral part of the right third finger  Past medical history includes COPD, obstructive sleep apnea on nocturnal O2, type 2 diabetes with peripheral neuropathy, retinopathy, chronic diastolic heart failure coronary artery disease, severe diabetic neuropathy type 2 diabetes apparently diagnosed at age 72 on insulin, COPD, continued tobacco abuse, left leg deep DVT, hypertension, TIAs, seizure disorders, asthma The patient is actually had arterial studies in February of this year. ABIs bilaterally were 1.02 and TBI's bilaterally at 0.6. Waveforms were triphasic and biphasic. She was not felt to have significant arterial disease. 12/23; patient readmitted the clinic last week. She has predominantly venous insufficiency wounds on the right medial lower leg. She also had open areas on the fingers of both hands which happened in a very bizarre way. She has almost complete healing on the right medial calf. The area on the left third finger is healed. She still has 2 open wounds on the medial aspect of the PIP of the right second finger and the dorsal aspect of the right PIP of the third finger. Of these 2 wounds the PIP of the third finger wound is painful and deeper 12/30; the areas on her right medial calf are closed. She likely has some degree of chronic venous insufficiency and lymphedema. Also very xerotic skin which I have recommended a moisturizing. I did not bring up the issue of compression stockings The areas we are still looking out are on the PIP of the right third finger and the medial part of the PIP of the right second finger. The second finger area is just about closed. The problem is over the PIP of the right third finger. This does not look  infected and she is completing her antibiotics from last week. However this is a deep wound at the base of this there is either tendon or periosteum. She she has some healthy granulation but we are not seeing that close over the bottom part of the wound. We are probably going to need to immobilize the finger somewhat so that she does not continuously pull the tissue apart 07/17/2019; right medial calf remains closed and the right second finger is closed. The sole problem here is an area over the PIP of the third finger. These wounds were initially advertised as burn injuries that happened in a very bizarre way [please see my initial discussion on this]. I gave her doxycycline last week because of some erythema around the wound however she did not tolerate this very well because of nausea. She stopped this within the last day or 2. we have been using silver collagen 1/13; the only wound we are looking at is the right third finger over the PIP. We are using endoform. X-ray ordered last time showed no underlying bone abnormalities 1/20; right third finger over the PIP. Still debris at the surface we have been using endoform. We are making some improvements in surface area and volume but still requiring debridement. 1/27; right third finger over the PIP. Still debris on the surface of the wound and maceration. We have been using endoform. This wound initially went to the periosteum and it certainly is off that but is certainly stalled over the last 2 or 3 weeks 2/10; right third finger over the PIP. We switch to Iodoflex last time but she says this caused extreme erythema and blistering and they changed back to endoform. This is a wound that originally went to the periosteum. This is improved since her admission but really it is stalled. She missed her appointment last week but she states  she has been using endoform. She complains of increasing pain in the finger that is making it difficult for her to  sleep at times 2/17; right third finger over the PIP. This is a difficult wound small punched out area. Culture I did last week showed methicillin sensitive staph aureus however she is allergic to penicillin [widespread rash as a child] she has not had cephalosporins. Multiple drug interactions with quinolones and I was concerned about trimethoprim sulfamethoxazole in somebody with ACE inhibitors and spironolactone. I prescribed doxycycline. This was 2 days ago. She comes in today without the antibiotic saying that it caused mental status changes fatigue and diarrhea the last time she was on this. 2/24; right third finger over the PIP. She is tolerating the doxycycline has another 3 days left. This is for the area on the PIP of the third finger on her right. She arrives in clinic today with a thick raised eschar over the PIP of the left third finger. I talked to her about this. The exact reason is unclear. She does smoke but denies it could be a burn injury. Her husband thinks this may be trauma pushing her wheelchair but the depth of this makes this somewhat unusual. She does not appear to have any blood flow issues in either arm Readmission: 06/29/2020 upon evaluation today patient appears to be doing somewhat poorly in regard to her left lower extremity. She has a couple open areas here which appear to be venous in nature. One of them is a more recent trauma but nonetheless both are very superficial. She does have a lot of lymphedema/stasis type weeping from the wound opening which I think is preventing her from healing. She has been trying to work on this JASON, HAUGE ([id number]) on her own but unfortunately just is not getting the results she needs as far as getting the swelling under control to allow these wounds to heal. Currently the patient tells me as well that she has been undergoing treatment for breast cancer on the right. I think she has a left mastectomy as well. She also has COPD.  Her husband is actually taking very good care of her and doing what he can at this point in time. Patient does have hospice coming out to see her as well. Readmission: 02/02/21 second married issues that she was having with cellulitis. She tells me currently that she was in the hospital for several days and subsequently it was during that time that she developed a wound on her left heel. Initially this was thought to just be a blister and she was told not to pop it. Subsequently upon getting home she notes that this started to worsen an opening drain. That's when she decided to contact the wound center as we've seen her previously and taking care of ulcerations for her. Upon evaluation today the patient actually showed signs of what appears to be a pressure injury stage III to the left heel. There does not appear to be any significant depth although there's just one spot that goes down to the subcutaneous layer were there some Slough noted there is a lot of blistered skin and callous tissue that is going to need to be removed help this to heal properly. The patient voiced understanding. Otherwise her medical history per above really has not changed intricately at this point. The patient was started on Doxy cycling two days ago 02/08/2021 upon evaluation today patient's wound actually showing signs of excellent improvement. I do not see any  evidence of infection which is great news and after the debridement this actually seems to have done great over the past week. I am extremely pleased with what I see today. 9/7; patient presents for follow-up. Its been over a month since patient was last seen. She has been using Xeroform to the wound daily. She denies signs of infection. She has no issues or complaints today. 04/08/2021 upon evaluation today patient appears to be doing well currently in regard to her wound. She has been making great progress and overall very pleased with how the Xeroform is doing. I  think she is significantly smaller and overall headed in the appropriate direction. Electronic Signature(s) Signed: 04/08/2021 10:37:27 AM By: Worthy Keeler PA-C Entered By: Worthy Keeler on 04/08/2021 10:37:26 [place], [name] ([id number]) -------------------------------------------------------------------------------- Physical Exam Details Patient Name: [name] Date of Service: 04/08/2021 10:00 AM Medical Record Number: [id number] Patient Account Number: [id number] Date of Birth/Sex: [birth year] (50 y.o. F) Treating RN: Dolan Amen Primary Care Provider: Alma Friendly Other Clinician: Referring Provider: Alma Friendly Treating Provider/Extender: Skipper Cliche in Treatment: 9 Constitutional Well-nourished and well-hydrated in no acute distress. Respiratory normal breathing without difficulty. Psychiatric this patient is able to make decisions and demonstrates good insight into disease process. Alert and Oriented x 3. pleasant and cooperative. Notes Upon inspection patient's wound bed actually showed signs of good granulation epithelization at this point. Fortunately there does not appear to be any signs of infection there is some slough and biofilm on the surface of the wound as well as some callus around the edge. I did perform sharp debridement to clear this away today and the patient tolerated that without complication postdebridement the wound bed appears to be doing much better. Electronic Signature(s) Signed: 04/08/2021 10:37:48 AM By: Worthy Keeler PA-C Entered By: Worthy Keeler on 04/08/2021 10:37:47 [place], [name] ([id number]) -------------------------------------------------------------------------------- Physician Orders Details Patient Name: [name] Date of Service: 04/08/2021 10:00 AM Medical Record Number: [id number] Patient Account Number: [id number] Date of Birth/Sex: 1971-06-11 (50 y.o. 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 Bathing/ Shower/ Hygiene o May shower; gently cleanse wound with antibacterial soap, rinse and pat dry prior to dressing wounds Wound Treatment Wound #9 - Calcaneus Wound Laterality: Left Cleanser: Soap and Water 1 x Per Day/30 Days Discharge Instructions: Gently cleanse wound with antibacterial soap, rinse and pat dry prior to dressing wounds Primary Dressing: Xeroform 4x4-HBD (in/in) 1 x Per Day/30 Days Discharge Instructions: Apply Xeroform 4x4-HBD (in/in) as directed Secondary Dressing: Gauze 1 x Per Day/30 Days Discharge Instructions: Apply dry gauze to cover xeroform Secured With: Coban Cohesive Bandage 4x5 (yds) Stretched 1 x Per Day/30 Days Discharge Instructions: Apply Coban as directed. Electronic Signature(s) Signed: 04/08/2021 4:16:02 PM By: Dolan Amen RN Signed: 04/08/2021 5:23:54 PM By: Worthy Keeler PA-C Entered By: Dolan Amen on 04/08/2021 10:36:42 [place], [name] ([id number]) -------------------------------------------------------------------------------- Problem List Details Patient Name: [name] Date of Service: 04/08/2021 10:00 AM Medical Record Number: [id number] Patient Account Number: [id number] Date of Birth/Sex: Mar 02, 1971 (50 y.o. 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 F) Treating RN: Dolan Amen Primary Care Provider: Alma Friendly Other Clinician: Referring Provider: Alma Friendly Treating Provider/Extender: Skipper Cliche in Treatment: 9 Subjective Chief Complaint Information obtained from Patient Left Heel Ulcer History of Present Illness (HPI) ADMISSION 06/19/2019 Patient is a 50 year old woman who is accompanied by her husband. She has severe diabetic peripheral neuropathy which is left her minimally ambulatory. She spends most of the time in a wheelchair. She also has a history of chronic lower extremity edema history of left leg DVT. She states that on 2 separate occasions roughly a month ago she fell with lacerations on the bilateral anterior tibial areas. She was seen by primary care on 06/04/2019 diagnosed with cellulitis of the left leg put on Bactrim DS. Also noted to have hand wounds with a question of referral to hand surgery. She was also sent to our clinic at the same time. The patient's husband is doing the dressings. He has been using silver alginate that  was supplied by home health. He is done a really good job the area on the left leg is actually healed only a small superficial area on the right Somewhere in the in the last 2 weeks she gives a bizarre history of going to wash nail polish off the fingers of both hands. She apparently did not realize that her son had put Clorox in the vinegar jar that they used to clean the countertop. She ended up burning her hands. The patient has a wound on the PIP of the left third finger, PIP of the right second finger and the lateral part of the right third finger Past medical history includes COPD, obstructive sleep apnea on nocturnal O2, type 2 diabetes with peripheral neuropathy, retinopathy, chronic diastolic heart failure coronary artery disease, severe diabetic neuropathy type 2 diabetes apparently diagnosed at age 18 on insulin, COPD, continued tobacco abuse, left leg deep DVT, hypertension, TIAs, seizure disorders, asthma The patient is actually had arterial studies in February of this year. ABIs bilaterally were 1.02 and TBI's bilaterally at 0.6. Waveforms were triphasic and biphasic. She was not felt to have significant arterial disease. 12/23; patient readmitted the clinic last week. She has predominantly venous insufficiency wounds on the right medial lower leg. She also had open areas on the fingers of both hands which happened in a very bizarre way. She has almost complete healing on the right medial calf. The area on the left third finger is healed. She still has 2 open wounds on the medial aspect of the PIP of the right second finger and the dorsal aspect of the right PIP of the third finger. Of these 2 wounds the PIP of the third finger wound is painful and deeper 12/30; the  areas on her right medial calf are closed. She likely has some degree of chronic venous insufficiency and lymphedema. Also very xerotic skin which I have recommended a moisturizing. I did not bring up the issue of  compression stockings The areas we are still looking out are on the PIP of the right third finger and the medial part of the PIP of the right second finger. The second finger area is just about closed. The problem is over the PIP of the right third finger. This does not look infected and she is completing her antibiotics from last week. However this is a deep wound at the base of this there is either tendon or periosteum. She she has some healthy granulation but we are not seeing that close over the bottom part of the wound. We are probably going to need to immobilize the finger somewhat so that she does not continuously pull the tissue apart 07/17/2019; right medial calf remains closed and the right second finger is closed. The sole problem here is an area over the PIP of the third finger. These wounds were initially advertised as burn injuries that happened in a very bizarre way [please see my initial discussion on this]. I gave her doxycycline last week because of some erythema around the wound however she did not tolerate this very well because of nausea. She stopped this within the last day or 2. we have been using silver collagen 1/13; the only wound we are looking at is the right third finger over the PIP. We are using endoform. X-ray ordered last time showed no underlying bone abnormalities 1/20; right third finger over the PIP. Still debris at the surface we have been using endoform. We are making some improvements in surface area and volume but still requiring debridement. 1/27; right third finger over the PIP. Still debris on the surface of the wound and maceration. We have been using endoform. This wound initially went to the periosteum and it certainly is off that but is certainly stalled over the last 2 or 3 weeks 2/10; right third finger over the PIP. We switch to Iodoflex last time but she says this caused extreme erythema and blistering and they changed back to endoform. This is a  wound that originally went to the periosteum. This is improved since her admission but really it is stalled. She missed her appointment last week but she states she has been using endoform. She complains of increasing pain in the finger that is making it difficult for her to sleep at times 2/17; right third finger over the PIP. This is a difficult wound small punched out area. Culture I did last week showed methicillin sensitive staph aureus however she is allergic to penicillin [widespread rash as a child] she has not had cephalosporins. Multiple drug interactions with quinolones and I was concerned about trimethoprim sulfamethoxazole in somebody with ACE inhibitors and spironolactone. I prescribed doxycycline. This was 2 days ago. She comes in today without the antibiotic saying that it caused mental status changes fatigue and diarrhea the last time she was on this. 2/24; right third finger over the PIP. She is tolerating the doxycycline has another 3 days left. This is for the area on the PIP of the third finger on her right. She arrives in clinic today with a thick raised eschar over the PIP of the left third finger. I talked to her about this. The exact reason is unclear. She does smoke but denies it could be a burn  injury. Her husband thinks this may be trauma pushing her wheelchair but the depth of this makes this somewhat unusual. She does not appear to have any blood flow issues in either arm Wester[place], Jonathan ([id number]) Readmission: 06/29/2020 upon evaluation today patient appears to be doing somewhat poorly in regard to her left lower extremity. She has a couple open areas here which appear to be venous in nature. One of them is a more recent trauma but nonetheless both are very superficial. She does have a lot of lymphedema/stasis type weeping from the wound opening which I think is preventing her from healing. She has been trying to work on this on her own but unfortunately just is not  getting the results she needs as far as getting the swelling under control to allow these wounds to heal. Currently the patient tells me as well that she has been undergoing treatment for breast cancer on the right. I think she has a left mastectomy as well. She also has COPD. Her husband is actually taking very good care of her and doing what he can at this point in time. Patient does have hospice coming out to see her as well. Readmission: 02/02/21 second married issues that she was having with cellulitis. She tells me currently that she was in the hospital for several days and subsequently it was during that time that she developed a wound on her left heel. Initially this was thought to just be a blister and she was told not to pop it. Subsequently upon getting home she notes that this started to worsen an opening drain. That's when she decided to contact the wound center as we've seen her previously and taking care of ulcerations for her. Upon evaluation today the patient actually showed signs of what appears to be a pressure injury stage III to the left heel. There does not appear to be any significant depth although there's just one spot that goes down to the subcutaneous layer were there some Slough noted there is a lot of blistered skin and callous tissue that is going to need to be removed help this to heal properly. The patient voiced understanding. Otherwise her medical history per above really has not changed intricately at this point. The patient was started on Doxy cycling two days ago 02/08/2021 upon evaluation today patient's wound actually showing signs of excellent improvement. I do not see any evidence of infection which is great news and after the debridement this actually seems to have done great over the past week. I am extremely pleased with what I see today. 9/7; patient presents for follow-up. Its been over a month since patient was last seen. She has been using Xeroform to the  wound daily. She denies signs of infection. She has no issues or complaints today. 04/08/2021 upon evaluation today patient appears to be doing well currently in regard to her wound. She has been making great progress and overall very pleased with how the Xeroform is doing. I think she is significantly smaller and overall headed in the appropriate direction. Objective Constitutional Well-nourished and well-hydrated in no acute distress. Vitals Time Taken: 10:16 AM, Height: 64 in, Weight: 242 lbs, BMI: 41.5, Temperature: 98.0 F, Pulse: 79 bpm, Respiratory Rate: 16 breaths/min, Blood Pressure: 146/78 mmHg. Respiratory normal breathing without difficulty. Psychiatric this patient is able to make decisions and demonstrates good insight into disease process. Alert and Oriented x 3. pleasant and cooperative. General Notes: Upon inspection patient's wound bed actually showed signs of good  granulation epithelization at this point. Fortunately there does not appear to be any signs of infection there is some slough and biofilm on the surface of the wound as well as some callus around the edge. I did perform sharp debridement to clear this away today and the patient tolerated that without complication postdebridement the wound bed appears to be doing much better. Integumentary (Hair, Skin) Wound #9 status is Open. Original cause of wound was Blister. The date acquired was: 01/13/2021. The wound has been in treatment 9 weeks. The wound is located on the Left Calcaneus. The wound measures 0.7cm length x 0.7cm width x 0.1cm depth; 0.385cm^2 area and 0.038cm^3 volume. There is Fat Layer (Subcutaneous Tissue) exposed. There is no tunneling or undermining noted. There is a small amount of sanguinous drainage noted. There is large (67-100%) red, pink granulation within the wound bed. There is a small (1-33%) amount of necrotic tissue within the wound bed including Adherent Slough. 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 There was a Excisional Skin/Subcutaneous Tissue Debridement with a total area of 4 sq cm performed by Tommie Sams., PA-C. With the following instrument(s): Curette to remove Viable and Non-Viable tissue/material. Material removed includes Callus, Subcutaneous Tissue, Slough, and Skin: Epidermis. A time out was conducted at 10:29, prior to the start of the procedure. A Moderate amount of bleeding was controlled with Silver Nitrate. The procedure was tolerated well. Post Debridement Measurements: 0.7cm length x 0.7cm width x 0.1cm depth; 0.038cm^3 volume. Post debridement Stage noted as Category/Stage III. Character of Wound/Ulcer Post Debridement is stable. Post procedure Diagnosis Wound #9: Same as Pre-Procedure General Notes: 1 silver nitrate stick used. Plan Follow-up Appointments: Return Appointment in 3 weeks. Bathing/ Shower/ Hygiene: May shower; gently cleanse wound with antibacterial soap, rinse and pat dry prior to dressing wounds WOUND #9: - Calcaneus Wound Laterality: Left Cleanser: Soap and Water 1 x Per Day/30 Days Discharge Instructions: Gently cleanse wound with antibacterial soap, rinse and pat dry prior to dressing wounds Primary Dressing: Xeroform 4x4-HBD (in/in) 1 x Per Day/30 Days Discharge Instructions: Apply Xeroform 4x4-HBD (in/in) as directed Secondary Dressing: Gauze 1 x Per Day/30 Days Discharge Instructions: Apply dry gauze to cover xeroform Secured With: Coban Cohesive Bandage 4x5 (yds) Stretched 1 x Per Day/30 Days Discharge Instructions: Apply Coban as directed. 1. Would recommend  currently that we go ahead and continue with the Xeroform gauze dressing which I think is doing an awesome job at this point. The patient is in agreement with the plan. 2. I am also going to recommend that we continue to secure with gauze as well as roll gauze and then Coban to hold everything in place. 3. I did advise that can use lotion up underneath the roll gauze in order to help prevent this from drying out as much as her foot is very dry. We will see patient back for reevaluation in 3 weeks here in the clinic. If anything worsens or changes patient will contact our office for additional recommendations. Electronic Signature(s) Signed: 04/08/2021 10:38:29 AM By: Worthy Keeler PA-C Entered By: Worthy Keeler on 04/08/2021 10:38:29 Wales, [name] ([id number]) -------------------------------------------------------------------------------- SuperBill Details Patient Name: [name] Date of Service: 04/08/2021 Medical Record  Number: [id number] Patient Account Number: [id number] Date of Birth/Sex: Nov 19, 1970 (50 y.o. 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 J44.9 Chronic obstructive pulmonary disease, unspecified Facility Procedures CPT4 Code: [id number] Description: 37445 - DEB SUBQ TISSUE 20 SQ CM/< Modifier: Quantity: 1 CPT4 Code: Description: ICD-10 Diagnosis Description L89.623 Pressure ulcer of left heel, stage 3 Modifier: Quantity: Physician Procedures CPT4 Code: [id number] Description: 98721 - WC PHYS SUBQ TISS 20 SQ CM Modifier: Quantity: 1 CPT4 Code: Description: ICD-10 Diagnosis  Description L89.623 Pressure ulcer of left heel, stage 3 Modifier: Quantity: Electronic Signature(s) Signed: 04/08/2021 10:38:40 AM By: Worthy Keeler PA-C Entered By: Worthy Keeler on 04/08/2021 10:38:40

## 2021-04-09 DIAGNOSIS — I10 Essential (primary) hypertension: Secondary | ICD-10-CM

## 2021-04-09 DIAGNOSIS — E1165 Type 2 diabetes mellitus with hyperglycemia: Secondary | ICD-10-CM | POA: Diagnosis not present

## 2021-04-09 DIAGNOSIS — E119 Type 2 diabetes mellitus without complications: Secondary | ICD-10-CM | POA: Diagnosis not present

## 2021-04-09 DIAGNOSIS — J449 Chronic obstructive pulmonary disease, unspecified: Secondary | ICD-10-CM | POA: Diagnosis not present

## 2021-04-09 DIAGNOSIS — I130 Hypertensive heart and chronic kidney disease with heart failure and stage 1 through stage 4 chronic kidney disease, or unspecified chronic kidney disease: Secondary | ICD-10-CM | POA: Diagnosis not present

## 2021-04-09 DIAGNOSIS — I5032 Chronic diastolic (congestive) heart failure: Secondary | ICD-10-CM

## 2021-04-13 DIAGNOSIS — E1122 Type 2 diabetes mellitus with diabetic chronic kidney disease: Secondary | ICD-10-CM | POA: Diagnosis not present

## 2021-04-13 DIAGNOSIS — C50511 Malignant neoplasm of lower-outer quadrant of right female breast: Secondary | ICD-10-CM | POA: Diagnosis not present

## 2021-04-13 DIAGNOSIS — E1165 Type 2 diabetes mellitus with hyperglycemia: Secondary | ICD-10-CM | POA: Diagnosis not present

## 2021-04-13 DIAGNOSIS — I130 Hypertensive heart and chronic kidney disease with heart failure and stage 1 through stage 4 chronic kidney disease, or unspecified chronic kidney disease: Secondary | ICD-10-CM | POA: Diagnosis not present

## 2021-04-13 DIAGNOSIS — L89622 Pressure ulcer of left heel, stage 2: Secondary | ICD-10-CM | POA: Diagnosis not present

## 2021-04-13 DIAGNOSIS — R519 Headache, unspecified: Secondary | ICD-10-CM | POA: Diagnosis not present

## 2021-04-13 DIAGNOSIS — N182 Chronic kidney disease, stage 2 (mild): Secondary | ICD-10-CM | POA: Diagnosis not present

## 2021-04-13 DIAGNOSIS — E1151 Type 2 diabetes mellitus with diabetic peripheral angiopathy without gangrene: Secondary | ICD-10-CM | POA: Diagnosis not present

## 2021-04-13 DIAGNOSIS — J449 Chronic obstructive pulmonary disease, unspecified: Secondary | ICD-10-CM | POA: Diagnosis not present

## 2021-04-13 DIAGNOSIS — E11622 Type 2 diabetes mellitus with other skin ulcer: Secondary | ICD-10-CM | POA: Diagnosis not present

## 2021-04-13 DIAGNOSIS — I2510 Atherosclerotic heart disease of native coronary artery without angina pectoris: Secondary | ICD-10-CM | POA: Diagnosis not present

## 2021-04-13 DIAGNOSIS — I5032 Chronic diastolic (congestive) heart failure: Secondary | ICD-10-CM | POA: Diagnosis not present

## 2021-04-13 DIAGNOSIS — G40909 Epilepsy, unspecified, not intractable, without status epilepticus: Secondary | ICD-10-CM | POA: Diagnosis not present

## 2021-04-13 DIAGNOSIS — E1142 Type 2 diabetes mellitus with diabetic polyneuropathy: Secondary | ICD-10-CM | POA: Diagnosis not present

## 2021-04-13 DIAGNOSIS — I69334 Monoplegia of upper limb following cerebral infarction affecting left non-dominant side: Secondary | ICD-10-CM | POA: Diagnosis not present

## 2021-04-13 DIAGNOSIS — F319 Bipolar disorder, unspecified: Secondary | ICD-10-CM | POA: Diagnosis not present

## 2021-04-14 ENCOUNTER — Inpatient Hospital Stay: Payer: BC Managed Care – PPO | Attending: Internal Medicine

## 2021-04-14 ENCOUNTER — Inpatient Hospital Stay (HOSPITAL_BASED_OUTPATIENT_CLINIC_OR_DEPARTMENT_OTHER): Payer: BC Managed Care – PPO | Admitting: Internal Medicine

## 2021-04-14 ENCOUNTER — Emergency Department: Payer: BC Managed Care – PPO

## 2021-04-14 ENCOUNTER — Emergency Department
Admission: EM | Admit: 2021-04-14 | Discharge: 2021-04-14 | Disposition: A | Discharge status: Home / Self Care | Payer: BC Managed Care – PPO | Attending: Emergency Medicine | Admitting: Emergency Medicine

## 2021-04-14 ENCOUNTER — Other Ambulatory Visit: Payer: Self-pay

## 2021-04-14 ENCOUNTER — Encounter: Payer: Self-pay | Admitting: Internal Medicine

## 2021-04-14 VITALS — BP 141/72 | HR 72 | Temp 97.8°F | Resp 18 | Wt 239.0 lb

## 2021-04-14 DIAGNOSIS — C50511 Malignant neoplasm of lower-outer quadrant of right female breast: Secondary | ICD-10-CM

## 2021-04-14 DIAGNOSIS — I5032 Chronic diastolic (congestive) heart failure: Secondary | ICD-10-CM | POA: Insufficient documentation

## 2021-04-14 DIAGNOSIS — N182 Chronic kidney disease, stage 2 (mild): Secondary | ICD-10-CM | POA: Diagnosis not present

## 2021-04-14 DIAGNOSIS — J449 Chronic obstructive pulmonary disease, unspecified: Secondary | ICD-10-CM | POA: Diagnosis not present

## 2021-04-14 DIAGNOSIS — Z859 Personal history of malignant neoplasm, unspecified: Secondary | ICD-10-CM | POA: Diagnosis not present

## 2021-04-14 DIAGNOSIS — Z853 Personal history of malignant neoplasm of breast: Secondary | ICD-10-CM | POA: Insufficient documentation

## 2021-04-14 DIAGNOSIS — W102XXA Fall (on)(from) incline, initial encounter: Secondary | ICD-10-CM | POA: Diagnosis not present

## 2021-04-14 DIAGNOSIS — M549 Dorsalgia, unspecified: Secondary | ICD-10-CM | POA: Insufficient documentation

## 2021-04-14 DIAGNOSIS — R6889 Other general symptoms and signs: Secondary | ICD-10-CM | POA: Diagnosis not present

## 2021-04-14 DIAGNOSIS — S2232XA Fracture of one rib, left side, initial encounter for closed fracture: Secondary | ICD-10-CM | POA: Diagnosis not present

## 2021-04-14 DIAGNOSIS — R0781 Pleurodynia: Secondary | ICD-10-CM | POA: Diagnosis not present

## 2021-04-14 DIAGNOSIS — I130 Hypertensive heart and chronic kidney disease with heart failure and stage 1 through stage 4 chronic kidney disease, or unspecified chronic kidney disease: Secondary | ICD-10-CM | POA: Insufficient documentation

## 2021-04-14 DIAGNOSIS — Z170 Estrogen receptor positive status [ER+]: Secondary | ICD-10-CM | POA: Diagnosis not present

## 2021-04-14 DIAGNOSIS — W19XXXA Unspecified fall, initial encounter: Secondary | ICD-10-CM

## 2021-04-14 DIAGNOSIS — J45909 Unspecified asthma, uncomplicated: Secondary | ICD-10-CM | POA: Insufficient documentation

## 2021-04-14 DIAGNOSIS — I2510 Atherosclerotic heart disease of native coronary artery without angina pectoris: Secondary | ICD-10-CM | POA: Insufficient documentation

## 2021-04-14 DIAGNOSIS — M545 Low back pain, unspecified: Secondary | ICD-10-CM | POA: Diagnosis not present

## 2021-04-14 DIAGNOSIS — S2242XA Multiple fractures of ribs, left side, initial encounter for closed fracture: Secondary | ICD-10-CM | POA: Diagnosis not present

## 2021-04-14 DIAGNOSIS — Z743 Need for continuous supervision: Secondary | ICD-10-CM | POA: Diagnosis not present

## 2021-04-14 DIAGNOSIS — M546 Pain in thoracic spine: Secondary | ICD-10-CM | POA: Diagnosis not present

## 2021-04-14 LAB — CBC WITH DIFFERENTIAL/PLATELET
Abs Immature Granulocytes: 0.04 10*3/uL (ref 0.00–0.07)
Basophils Absolute: 0.1 10*3/uL (ref 0.0–0.1)
Basophils Relative: 1 %
Eosinophils Absolute: 0.2 10*3/uL (ref 0.0–0.5)
Eosinophils Relative: 3 %
HCT: 42.9 % (ref 36.0–46.0)
Hemoglobin: 13.6 g/dL (ref 12.0–15.0)
Immature Granulocytes: 1 %
Lymphocytes Relative: 13 %
Lymphs Abs: 1 10*3/uL (ref 0.7–4.0)
MCH: 27.9 pg (ref 26.0–34.0)
MCHC: 31.7 g/dL (ref 30.0–36.0)
MCV: 87.9 fL (ref 80.0–100.0)
Monocytes Absolute: 0.5 10*3/uL (ref 0.1–1.0)
Monocytes Relative: 6 %
Neutro Abs: 6.3 10*3/uL (ref 1.7–7.7)
Neutrophils Relative %: 76 %
Platelets: 177 10*3/uL (ref 150–400)
RBC: 4.88 MIL/uL (ref 3.87–5.11)
RDW: 17.5 % — ABNORMAL HIGH (ref 11.5–15.5)
WBC: 8.1 10*3/uL (ref 4.0–10.5)
nRBC: 0 % (ref 0.0–0.2)

## 2021-04-14 LAB — COMPREHENSIVE METABOLIC PANEL
ALT: 18 U/L (ref 0–44)
AST: 16 U/L (ref 15–41)
Albumin: 4 g/dL (ref 3.5–5.0)
Alkaline Phosphatase: 166 U/L — ABNORMAL HIGH (ref 38–126)
Anion gap: 7 (ref 5–15)
BUN: 29 mg/dL — ABNORMAL HIGH (ref 6–20)
CO2: 27 mmol/L (ref 22–32)
Calcium: 9.1 mg/dL (ref 8.9–10.3)
Chloride: 99 mmol/L (ref 98–111)
Creatinine, Ser: 1.24 mg/dL — ABNORMAL HIGH (ref 0.44–1.00)
GFR, Estimated: 52 mL/min — ABNORMAL LOW (ref >60)
Glucose, Bld: 416 mg/dL — ABNORMAL HIGH (ref 70–99)
Potassium: 4.6 mmol/L (ref 3.5–5.1)
Sodium: 133 mmol/L — ABNORMAL LOW (ref 135–145)
Total Bilirubin: 1 mg/dL (ref 0.3–1.2)
Total Protein: 9.4 g/dL — ABNORMAL HIGH (ref 6.5–8.1)

## 2021-04-14 MED ORDER — ONDANSETRON 4 MG PO TBDP: 4.0000 mg | ORAL_TABLET | Freq: Three times a day (TID) | ORAL | 0 refills | Status: AC | PRN

## 2021-04-14 MED ORDER — ACETAMINOPHEN 500 MG PO TABS
1000.0000 mg | ORAL_TABLET | Freq: Once | ORAL | Status: AC
  Administered 2021-04-14: 1000 mg via ORAL
  Filled 2021-04-14: qty 2

## 2021-04-14 MED ORDER — OXYCODONE-ACETAMINOPHEN 5-325 MG PO TABS: 1.0000 | ORAL_TABLET | Freq: Four times a day (QID) | ORAL | 0 refills | Status: AC | PRN

## 2021-04-14 MED ORDER — LIDOCAINE 5 % EX PTCH
1.0000 | MEDICATED_PATCH | CUTANEOUS | Status: DC
  Administered 2021-04-14: 1 via TRANSDERMAL
  Filled 2021-04-14: qty 1

## 2021-04-14 NOTE — Progress Notes (Signed)
Patient reports being sore from physical therapy yesterday, no other concerns today

## 2021-04-14 NOTE — ED Triage Notes (Signed)
Pt here via [facility] with a fall and rib pain. Pt fell off a 29ft high ramp and landed on her R side. Pt denies hitting her head or LOC. Pt just finished CA treatment today.   160/90 73 97%

## 2021-04-14 NOTE — Discharge Instructions (Addendum)
You can take Percocet for pain. You can use incentive spirometer 2-3 times a day.

## 2021-04-14 NOTE — ED Notes (Signed)
Patient stable and discharged with all personal belongings and AVS. AVS and discharge instructions reviewed with patient and opportunity for questions provided.   

## 2021-04-14 NOTE — Assessment & Plan Note (Addendum)
#   T2N0- ER/PR-positive; premenopausal currently on Anastrazole [stopped tam sec to poor tolerance].  Continue Lupron Depo 11.25 mg every 3 months. AUG 2022- CT C/A/P- NED.   #Continue anastrozole + Lupron for now.  Tolerating fairly well.  Lupron-next visit.  # Headache/migraines-awaiting awaiting neurology evaluation do not suspect for malignancy; discussed with PCP. STABLE.    #Depression/anxiety/bipolar-currently on BuSpar/Celexa/trazodone As per psychiatry- STABLE.-  #Chronic respiratory failure-CHF/COPD; STABLE; however I reviewed the significant findings noted on the CT scan [bronchiolitis from smoking]; discussed at length regarding quitting smoking/recommend talking to PCP regarding smoking cessation aids.  #Poorly controlled diabetes-on insulin; [facility] AM- 416- [Dr.O'Connell endocrinology]-  Again stressed the importance of glucose control.   #Peripheral neuropathy grade 2-3; patient goes on with a walker/cane at baseline- STABLE.   #Genetics: mom- breast cancer; older brother at 70s- brain tumor; dad- brain tumor. # Genetics: Discussed with the patient majority of breast cancers are sporadic however 10 to 20% at risk of genetic/hereditary cancer syndromes.  Discussed importance of genetic counseling/genetic testing.  Patient interested in genetic counseling.  We will make a referral.   DISPOSITION:  # refer to genetics re; breast cancer /coordinate appointment with next  office visit.  # follow up in 1 month- MD; labs- cbc/cmp/ca-27-29; Eligard-Dr.B

## 2021-04-14 NOTE — Progress Notes (Signed)
[place] NOTE  Patient Care Team: Pleas Koch, NP as PCP - General (Internal Medicine) Rosamaria Lints, MD (Inactive) as Referring Physician (Neurology) Alvester Chou, NP (Nurse Practitioner) Renato Shin, MD as Consulting Physician (Endocrinology) Theodore Demark, RN as Oncology Nurse Navigator Ronny Bacon, MD as Consulting Physician (General Surgery) Alcova, [place] as Registered Nurse Flora Lipps, MD as Consulting Physician (Pulmonary Disease) Sherlynn Stalls, MD as Consulting Physician (Ophthalmology) Dannielle Karvonen, RN as [place] Management Marcelline Deist, Ranae Pila, LCSW as Social Worker  CHIEF COMPLAINTS/PURPOSE OF CONSULTATION: Breast cancer.  #  Oncology History Overview Note  #April 2021-2.5 cm right breast mass [incidental-CT scan chest]  # April 2021- RIGHT BREAST [facility]; s/p ER- 51-90%; PR- 51-90%; Her- 2-NEG. G-2. [Dr.Rodenberg]; s/p  Masectomy- pT2 pN0 (sn); Tamoxifen; Poor candidate for chemo;   # Discontinue tamoxifen early DEC 2021 [sec to nausea/vomiting diarrhea]  #Late December 2021- Anastrazole + Lupron 11.25 every 3 months  # DM- poorly controlled; Seizure disorder/Bipolar/TIA ; Diabetes PN- G-3 [cane/walker; falls]; COPD; OSA active smoker  # SURVIVORSHIP:   # GENETICS:   DIAGNOSIS: Right breast cancer  STAGE:    II     ;  GOALS: goal  CURRENT/MOST RECENT THERAPY : Anastrazole + Lupron 11.25 every 3 months     Carcinoma of lower-outer quadrant of right breast in female, estrogen receptor positive ([place])  11/12/2019 Initial Diagnosis   Carcinoma of lower-outer quadrant of right breast in female, estrogen receptor positive ([place])    HISTORY OF PRESENTING ILLNESS: Patient in wheelchair; accompanied by husband.  [name] 50 y.o.  female premenopausal patient with right-sided stage II ER/PR positive HER-2 negative breast cancer on adjuvant anastrozole +Lupron with multiple  comorbidities-including poorly controlled diabetes/bipolar disorder/active smoker/neuropathy is here for follow-up.  Patient continues to have elevated blood glucose; noncompliance/dietary indiscretion.  Otherwise no fever no chills.  No nausea no vomiting.  She has been try to lose weight.  She has lost intentional weight.  But she is more active/physically walking up to 4000-5000 steps a day.   Currently getting wig-because of continued hair loss.  Review of Systems  Constitutional:  Positive for malaise/fatigue. Negative for chills, diaphoresis, fever and weight loss.  HENT:  Negative for nosebleeds and sore throat.   Eyes:  Negative for double vision.  Respiratory:  Negative for cough, hemoptysis, sputum production, shortness of breath and wheezing.   Cardiovascular:  Negative for chest pain, palpitations, orthopnea and leg swelling.  Gastrointestinal:  Negative for abdominal pain, blood in stool, constipation, diarrhea, heartburn, melena, nausea and vomiting.  Genitourinary:  Negative for dysuria, frequency and urgency.  Musculoskeletal:  Positive for back pain, joint pain and myalgias.  Skin:  Negative for itching.  Neurological:  Positive for tingling. Negative for dizziness, focal weakness, weakness and headaches.  Endo/Heme/Allergies:  Does not bruise/bleed easily.  Psychiatric/Behavioral:  Negative for depression. The patient is not nervous/anxious and does not have insomnia.     MEDICAL HISTORY:  Past Medical History:  Diagnosis Date  . Anxiety    takes Prozac daily  . Anxiety   . Aortic valve calcification   . Asthma    Advair and Spirva daily  . Asthma   . Bipolar disorder ([place])   . Breast cancer, right breast ([place]) 12/23/2019  . CAD (coronary artery disease)    a. LHC 11/2013 done for CP/fluid retention: mild disease in prox LAD, mild-mod disease in mRCA, EF 60% with normal LVEDP.  b. Normal nuc 03/2016.  Marland Kitchen Cancer ([place])   . Cellulitis and abscess of trunk 03/12/2020  .  CHF (congestive heart failure) ([place])   . Chronic diastolic CHF (congestive heart failure) ([place])   . Chronic heart failure with preserved ejection fraction ([place]) 11/16/2018  . CKD (chronic kidney disease), stage II   . COPD (chronic obstructive pulmonary disease) (HCC)    a. nocturnal O2.  Marland Kitchen COPD (chronic obstructive pulmonary disease) ([place])   . Coronary artery disease   . Decreased urine stream   . Diabetes mellitus   . Diabetes mellitus without complication ([place])   . Dyspnea   . Elevated C-reactive protein (CRP) 01/29/2018  . Elevated sedimentation rate 01/29/2018  . Elevated serum hCG 01/19/2018  . Family history of adverse reaction to anesthesia    mom gets nauseated  . Foot pain, bilateral 05/22/2018  . GERD (gastroesophageal reflux disease)    takes Pepcid daily  . History of blood clots    left leg 3-52yr ago  . Hyperlipidemia   . Hypertension   . Hypertriglyceridemia   . Inguinal hernia, left 01/2015  . Muscle spasm   . Open wound of genital labia   . Peripheral neuropathy   . RBBB   . Seizures ([place]   . Sepsis ([place] 01/19/2018  . Sinus tachycardia    a. persistent since 2009.  .Marland KitchenSmokers' cough ([place]   . Status post mastectomy, right 01/14/2020  . Stroke ([facility] 1989   left sided weakness  . TIA (transient ischemic attack)   . Tobacco abuse   . Vulvar abscess 01/23/2018    SURGICAL HISTORY: Past Surgical History:  Procedure Laterality Date  . APPLICATION OF WOUND VAC Right 01/29/2020   Procedure: APPLICATION OF WOUND VAC;  Surgeon: RRonny Bacon MD;  Location: [facility] ORS;  Service: General;  Laterality: Right;  . BREAST BIOPSY Right 11/06/2019   u[place]oreacore path pending venus clip  . HERNIA REPAIR Left   . INCISION AND DRAINAGE ABSCESS N/A 01/26/2018   Procedure: INCISION AND DEBRIDEMENT OF VULVAR NECROTIZING SOFT TISSUE INFECTION;  Surgeon: WGreer Pickerel MD;  [place]  Service: General;  Laterality: N/A;  . INCISION AND DRAINAGE PERIRECTAL ABSCESS N/A 01/22/2018    Procedure: IRRIGATION AND DEBRIDEMENT LABIAL/VULVAR AREA;  Surgeon: BCoralie Keens MD;  [place]  Service: General;  Laterality: N/A;  . INCISION AND DRAINAGE PERIRECTAL ABSCESS N/A 01/29/2018   Procedure: IRRIGATION AND DEBRIDEMENT VULVA;  Surgeon: HExcell Seltzer MD;  [place]  Service: General;  Laterality: N/A;  . INGUINAL HERNIA REPAIR Left 04/08/2015   Procedure: OPEN LEFT INGUINAL HERNIA REPAIR WITH MESH;  Surgeon: ARalene Ok MD;  [place]  Service: General;  Laterality: Left;  . INSERTION OF MESH Left 04/08/2015   Procedure: INSERTION OF MESH;  Surgeon: ARalene Ok MD;  [place]  Service: General;  Laterality: Left;  . LAPAROSCOPY     Endometriosis  . LEFT HEART CATHETERIZATION WITH CORONARY ANGIOGRAM N/A 12/05/2013   Procedure: LEFT HEART CATHETERIZATION WITH CORONARY ANGIOGRAM;  Surgeon: JJettie Booze MD;  [facility];  Service: Cardiovascular;  Laterality: N/A;  . MASTECTOMY Right   . right kidney drained    . SIMPLE MASTECTOMY WITH AXILLARY SENTINEL NODE BIOPSY Right 12/23/2019   Procedure: Right SIMPLE MASTECTOMY WITH AXILLARY SENTINEL NODE BIOPSY;  Surgeon: RRonny Bacon MD;  Location: [facility];  Service: General;  Laterality: Right;  . TEE WITHOUT CARDIOVERSION N/A 11/28/2013   Procedure: TRANSESOPHAGEAL ECHOCARDIOGRAM (  TEE);  Surgeon: Thayer Headings, MD;  Location: [place];  Service: Cardiovascular;  Laterality: N/A;  . WOUND DEBRIDEMENT Right 01/29/2020   Procedure: DEBRIDEMENT WOUND, Excisional debridement skin, subcutaneous and muscle right chest wall;  Surgeon: Ronny Bacon, MD;  Location: [facility];  Service: General;  Laterality: Right;    SOCIAL HISTORY: Social History   Socioeconomic History  . Marital status: Married    Spouse name: Not on file  . Number of children: 2  . Years of education: Not on file  . Highest education level: Not on file  Occupational History  . Not on file   Tobacco Use  . Smoking status: Some Days    Packs/day: 0.25    Years: 36.00    Pack years: 9.00    Types: Cigarettes    Start date: 07/11/1981  . Smokeless tobacco: Never  . Tobacco comments:    1 ppd now  Vaping Use  . Vaping Use: Some days  Substance and Sexual Activity  . Alcohol use: No  . Drug use: No  . Sexual activity: Not Currently  Other Topics Concern  . Not on file  Social History Narrative   ** Merged History Encounter **       Lives in [place]   Married but lives with Boyfriend - not legally separated   Disabled - for BiPolar, Seizure disorder, diabetes   Formerly worked at [employer] 1 ppd; no alcohol. 2 step sons; no biologic children.       Social Determinants of Health   Financial Resource Strain: Not on file  Food Insecurity: No Food Insecurity  . Worried About Charity fundraiser in the Last Year: Never true  . Ran Out of Food in the Last Year: Never true  Transportation Needs: Not on file  Physical Activity: Not on file  Stress: Not on file  Social Connections: Not on file  Intimate Partner Violence: Not on file    FAMILY HISTORY: Family History  Problem Relation Age of Onset  . Venous thrombosis Brother   . Other Brother        BRAIN TUMOR  . Asthma Father   . Diabetes Father   . Coronary artery disease Mother   . Hypertension Mother   . Diabetes Mother   . Breast cancer Mother 34  . Asthma Sister   . Diabetes type II Brother   . Diabetes Brother     ALLERGIES:  is allergic to adhesive [tape], metoprolol, montelukast, morphine sulfate, penicillins, diltiazem, gabapentin, and midodrine.  MEDICATIONS:  No current facility-administered medications for this visit.   Current Outpatient Medications  Medication Sig Dispense Refill  . albuterol (VENTOLIN HFA) 108 (90 Base) MCG/ACT inhaler Inhale 2 puffs into the lungs every 6 (six) hours as needed for wheezing or shortness of breath.    . ALPRAZolam (XANAX) 1 MG tablet Take 1 mg  by mouth 4 (four) times daily as needed for anxiety.     Marland Kitchen anastrozole (ARIMIDEX) 1 MG tablet Take 1 tablet (1 mg total) by mouth daily. 30 tablet 3  . aspirin EC 81 MG tablet Take 81 mg by mouth daily before breakfast.    . blood glucose meter kit and supplies Use up to four times daily as directed. (FOR ICD-10 E10.9, E11.9). 1 each 0  . budesonide-formoterol (SYMBICORT) 80-4.5 MCG/ACT inhaler INHALE 2 PUFFS BY MOUTH EVERY 12 HOURS TO PREVENT COUGH OR WHEEZING *RINSE MOUTH AFTER EACH USE* 10.2 g  2  . busPIRone (BUSPAR) 30 MG tablet Take 30 mg by mouth 2 (two) times daily.    . citalopram (CELEXA) 20 MG tablet Take 1 tablet (20 mg total) by mouth daily. 30 tablet 2  . desvenlafaxine (PRISTIQ) 100 MG 24 hr tablet Take 100 mg by mouth daily.    Marland Kitchen Desvenlafaxine ER 100 MG TB24 Take 1 tablet by mouth every morning.    . diclofenac Sodium (VOLTAREN) 1 % GEL Apply 2 g topically 3 (three) times daily as needed. For pain. 100 g 0  . Empagliflozin-metFORMIN HCl ER 25-1000 MG TB24 Take 1 tablet by mouth daily with breakfast.    . EYLEA 2 MG/0.05ML SOSY     . glucose blood test strip Use as instructed 300 each 0  . Insulin Pen Needle 32G X 4 MM MISC Used to give insulin injections twice daily. 100 each 11  . insulin regular human CONCENTRATED (HUMULIN R) 500 UNIT/ML injection Inject 30 Units into the skin 3 (three) times daily with meals. 30 U BID    . Insulin Syringe-Needle U-100 (GLOBAL INJECT EASE INSULIN SYR) 30G X 1/2" 1 ML MISC See admin instructions.    . mupirocin ointment (BACTROBAN) 2 % Apply 1 application topically 2 (two) times daily. Apply to the affected area 2 times a day 22 g 3  . OXYGEN Inhale 2-4 L into the lungs 2 (two) times daily as needed (shortness of breath).     . pregabalin (LYRICA) 300 MG capsule Take 1 capsule (300 mg total) by mouth 2 (two) times daily. For nerve pain. 180 capsule 0  . ReliOn Ultra Thin Lancets 30G MISC Use as directed for blood sugar checks. 300 each 0  .  rOPINIRole (REQUIP) 1 MG tablet TAKE 1 TABLET BY MOUTH ONCE DAILY AT BEDTIME FOR  RESTLESS  LEGS 90 tablet 0  . rosuvastatin (CRESTOR) 20 MG tablet Take 1 tablet (20 mg total) by mouth daily. For cholesterol. 90 tablet 3  . spironolactone (ALDACTONE) 50 MG tablet Take 50 mg by mouth daily.    Marland Kitchen topiramate (TOPAMAX) 50 MG tablet Take 1 tablet (50 mg total) by mouth 2 (two) times daily. For headache prevention. 180 tablet 1  . traZODone (DESYREL) 100 MG tablet Take 200 mg by mouth at bedtime.    Marland Kitchen ULTICARE MINI PEN NEEDLES 31G X 6 MM MISC USE AS DIRECTED 3 TIMES DAILY 100 each 0  . XIGDUO XR 5-500 MG TB24 Take 1 tablet by mouth every morning.    Marland Kitchen FLUoxetine (PROZAC) 40 MG capsule Take 40 mg by mouth daily. (Patient not taking: No sig reported)     Facility-Administered Medications Ordered in Other Visits  Medication Dose Route Frequency Provider Last Rate Last Admin  . lidocaine (LIDODERM) 5 % 1 patch  1 patch Transdermal Q24H Vanessa Aurora, MD   1 patch at 04/14/21 1407      .  PHYSICAL EXAMINATION: ECOG PERFORMANCE STATUS: 1 - Symptomatic but completely ambulatory  Vitals:   04/14/21 0915  BP: (!) 141/72  Pulse: 72  Resp: 18  Temp: 97.8 F (36.6 C)  SpO2: 99%   Filed Weights   04/14/21 0915  Weight: 239 lb (108.4 kg)    Physical Exam HENT:     Head: Normocephalic and atraumatic.     Mouth/Throat:     Pharynx: No oropharyngeal exudate.  Eyes:     Pupils: Pupils are equal, round, and reactive to light.  Cardiovascular:     Rate  and Rhythm: Normal rate and regular rhythm.  Pulmonary:     Effort: No respiratory distress.     Breath sounds: No wheezing.     Comments: Decreased breath sounds bilaterally the bases. Abdominal:     General: Bowel sounds are normal. There is no distension.     Palpations: Abdomen is soft. There is no mass.     Tenderness: There is no abdominal tenderness. There is no guarding or rebound.  Musculoskeletal:        General: No tenderness.  Normal range of motion.     Cervical back: Normal range of motion and neck supple.  Skin:    General: Skin is warm.  Neurological:     Mental Status: She is alert and oriented to person, place, and time.  Psychiatric:        Mood and Affect: Affect normal.     LABORATORY DATA:  I have reviewed the data as listed Lab Results  Component Value Date   WBC 8.1 04/14/2021   HGB 13.6 04/14/2021   HCT 42.9 04/14/2021   MCV 87.9 04/14/2021   PLT 177 04/14/2021   Recent Labs    05/17/20 0307 05/17/20 2336 02/03/21 1427 03/03/21 1657 03/10/21 1018 04/14/21 0853  NA  --    < > 133* 135 131* 133*  K  --    < > 3.9 4.4 4.5 4.6  CL  --    < > 97* 99 98 99  CO2  --    < > _0 GLUCOSE  --    < > 233* 295* 403* 416*  BUN  --    < > 21* 24* 23* 29*  CREATININE  --    < > 0.98 0.92 1.05* 1.24*  CALCIUM  --    < > 9.1 8.9 9.0 9.1  GFRNONAA  --    < > >60 >60 >60 52*  PROT 8.2*   < > 8.6*  --  8.8* 9.4*  ALBUMIN 3.3*   < > 3.7  --  3.9 4.0  AST 21   < > 20  --  16 16  ALT 21   < > 18  --  18 18  ALKPHOS 130*   < > 153*  --  170* 166*  BILITOT 1.0   < > 0.6  --  0.8 1.0  BILIDIR 0.2  --   --   --   --   --   IBILI 0.8  --   --   --   --   --    < > = values in this interval not displayed.       RADIOGRAPHIC STUDIES: I have personally reviewed the radiological images as listed and agreed with the findings in the report. DG Ribs Unilateral W/Chest Left  Result Date: 04/14/2021 CLINICAL DATA:  [place].  Left rib pain. EXAM: LEFT RIBS AND CHEST - 3+ VIEW COMPARISON:  03/03/2021 FINDINGS: The cardiac silhouette, mediastinal and hilar contours are within normal limits. Chronic pulmonary changes no acute superimposed pulmonary process. No pleural effusion or pneumothorax. Dedicated views of the left ribs demonstrate a nondisplaced fracture of the ninth anterolateral rib. No other definite rib fractures are identified. IMPRESSION: 1. Nondisplaced left ninth anterolateral rib fracture.  2. Chronic pulmonary changes.  No acute cardiopulmonary findings. Electronically Signed   By: Marijo Sanes M.D.   On: 04/14/2021 15:28   DG Thoracic Spine 2 View  Result Date: 04/14/2021 CLINICAL DATA:  [place].  Back pain. EXAM: THORACIC SPINE 2 VIEWS COMPARISON:  None. FINDINGS: Normal alignment of the thoracic vertebral bodies. Moderate degenerative changes. No acute bony findings or abnormal paraspinal soft tissue swelling. The visualized posterior ribs are intact. IMPRESSION: Degenerative changes but no acute bony findings. Electronically Signed   By: Marijo Sanes M.D.   On: 04/14/2021 15:29   DG Lumbar Spine 2-3 Views  Result Date: 04/14/2021 CLINICAL DATA:  [place].  Back pain. EXAM: LUMBAR SPINE - 2-3 VIEW COMPARISON:  09/05/2018 FINDINGS: Normal alignment of the lumbar vertebral bodies. Disc spaces and vertebral bodies are maintained. The facets are normally aligned. No pars defects. The visualized bony pelvis is intact. IMPRESSION: Normal alignment and no acute bony findings. Electronically Signed   By: Marijo Sanes M.D.   On: 04/14/2021 15:31     ASSESSMENT & PLAN:   Carcinoma of lower-outer quadrant of right breast in female, estrogen receptor positive ([place]) # T2N0- ER/PR-positive; premenopausal currently on Anastrazole [stopped tam sec to poor tolerance].  Continue Lupron Depo 11.25 mg every 3 months. AUG 2022- CT C/A/P- NED.   #Continue anastrozole + Lupron for now.  Tolerating fairly well.  Lupron-next visit.  # Headache/migraines-awaiting awaiting neurology evaluation do not suspect for malignancy; discussed with PCP. STABLE.    #Depression/anxiety/bipolar-currently on BuSpar/Celexa/trazodone As per psychiatry- STABLE.-  #Chronic respiratory failure-CHF/COPD; STABLE; however I reviewed the significant findings noted on the CT scan [bronchiolitis from smoking]; discussed at length regarding quitting smoking/recommend talking to PCP regarding smoking cessation aids.  #Poorly  controlled diabetes-on insulin; FBG AM- 416- [Dr.O'Connell endocrinology]-  Again stressed the importance of glucose control.   #Peripheral neuropathy grade 2-3; patient goes on with a walker/cane at baseline- STABLE.   #Genetics: mom- breast cancer; older brother at 71s- brain tumor; dad- brain tumor. # Genetics: Discussed with the patient majority of breast cancers are sporadic however 10 to 20% at risk of genetic/hereditary cancer syndromes.  Discussed importance of genetic counseling/genetic testing.  Patient interested in genetic counseling.  We will make a referral.   DISPOSITION:  # refer to genetics re; breast cancer /coordinate appointment with next  office visit.  # follow up in 1 month- MD; labs- cbc/cmp/ca-27-29; [place]-Dr.B    All questions were answered. The patient knows to call the clinic with any problems, questions or concerns.    Cammie Sickle, MD 04/14/2021 4:13 PM

## 2021-04-14 NOTE — ED Provider Notes (Addendum)
HPI: Pt is a 50 y.o. female who presents with complaints of fall   The patient p/w  fall. Aspirin only. Tried to turn around while going down ramp and fell off it.  Hit left side and back pain. No LOC    ROS: Denies fever, chest pain, vomiting  Past Medical History:  Diagnosis Date   Anxiety    takes Prozac daily   Anxiety    Aortic valve calcification    Asthma    Advair and Spirva daily   Asthma    Bipolar disorder (HCC)    Breast cancer, right breast ([place]) 12/23/2019   CAD (coronary artery disease)    a. LHC 11/2013 done for CP/fluid retention: mild disease in prox LAD, mild-mod disease in mRCA, EF 60% with normal LVEDP. b. Normal nuc 03/2016.   Cancer ([place])    Cellulitis and abscess of trunk 03/12/2020   CHF (congestive heart failure) (HCC)    Chronic diastolic CHF (congestive heart failure) (HCC)    Chronic heart failure with preserved ejection fraction ([place]) 11/16/2018   CKD (chronic kidney disease), stage II    COPD (chronic obstructive pulmonary disease) (HCC)    a. nocturnal O2.   COPD (chronic obstructive pulmonary disease) (HCC)    Coronary artery disease    Decreased urine stream    Diabetes mellitus    Diabetes mellitus without complication (HCC)    Dyspnea    Elevated C-reactive protein (CRP) 01/29/2018   Elevated sedimentation rate 01/29/2018   Elevated serum hCG 01/19/2018   Family history of adverse reaction to anesthesia    mom gets nauseated   Foot pain, bilateral 05/22/2018   GERD (gastroesophageal reflux disease)    takes Pepcid daily   History of blood clots    left leg 3-42yrs ago   Hyperlipidemia    Hypertension    Hypertriglyceridemia    Inguinal hernia, left 01/2015   Muscle spasm    Open wound of genital labia    Peripheral neuropathy    RBBB    Seizures ([place])    Sepsis ([place]) 01/19/2018   Sinus tachycardia    a. persistent since 2009.   Smokers' cough ([place])    Status post mastectomy, right 01/14/2020   Stroke [facility]) 1989   left sided weakness    TIA (transient ischemic attack)    Tobacco abuse    Vulvar abscess 01/23/2018   Vitals:   04/14/21 1357 04/14/21 1358  BP:  (!) 126/94  Pulse:  76  Resp:  19  Temp: 98.3 F (36.8 C) 98.3 F (36.8 C)  SpO2:  95%    Focused Physical Exam: Gen: No acute distress Head: atraumatic, normocephalic Eyes: Extraocular movements grossly intact; conjunctiva clear CV: RRR Lung: No increased WOB, no stridor GI: ND, no obvious masses Neuro: Alert and awake Tender on the back and left chest wall.   Medical Decision Making and Plan: Given the patient's initial medical screening exam, the following diagnostic evaluation has been ordered. The patient will be placed in the appropriate treatment space, once one is available, to complete the evaluation and treatment. I have discussed the plan of care with the patient and I have advised the patient that an ED physician or mid-level practitioner will reevaluate their condition after the test results have been received, as the results may give them additional insight into the type of treatment they may need.   Diagnostics: xrays   Treatments: none immediately   Vanessa [place], MD 04/14/21 1402  Vanessa [place], MD 04/14/21 [phone]

## 2021-04-15 LAB — CANCER ANTIGEN 27.29: CA 27.29: 23 U/mL (ref 0.0–38.6)

## 2021-04-15 IMAGING — CR DG CHEST 2V
1 series · 2 of 2 positions shown · non-contrast
Comparison: 07/01/2020, 05/20/2020

CLINICAL DATA: 49-year-old female with a history dry cough and
shortness of breath

EXAM:
CHEST - 2 VIEW

[Series 1: dg chest 2 view · 0.14mm/px · 2 of 2 slices shown]
[im 1/2]
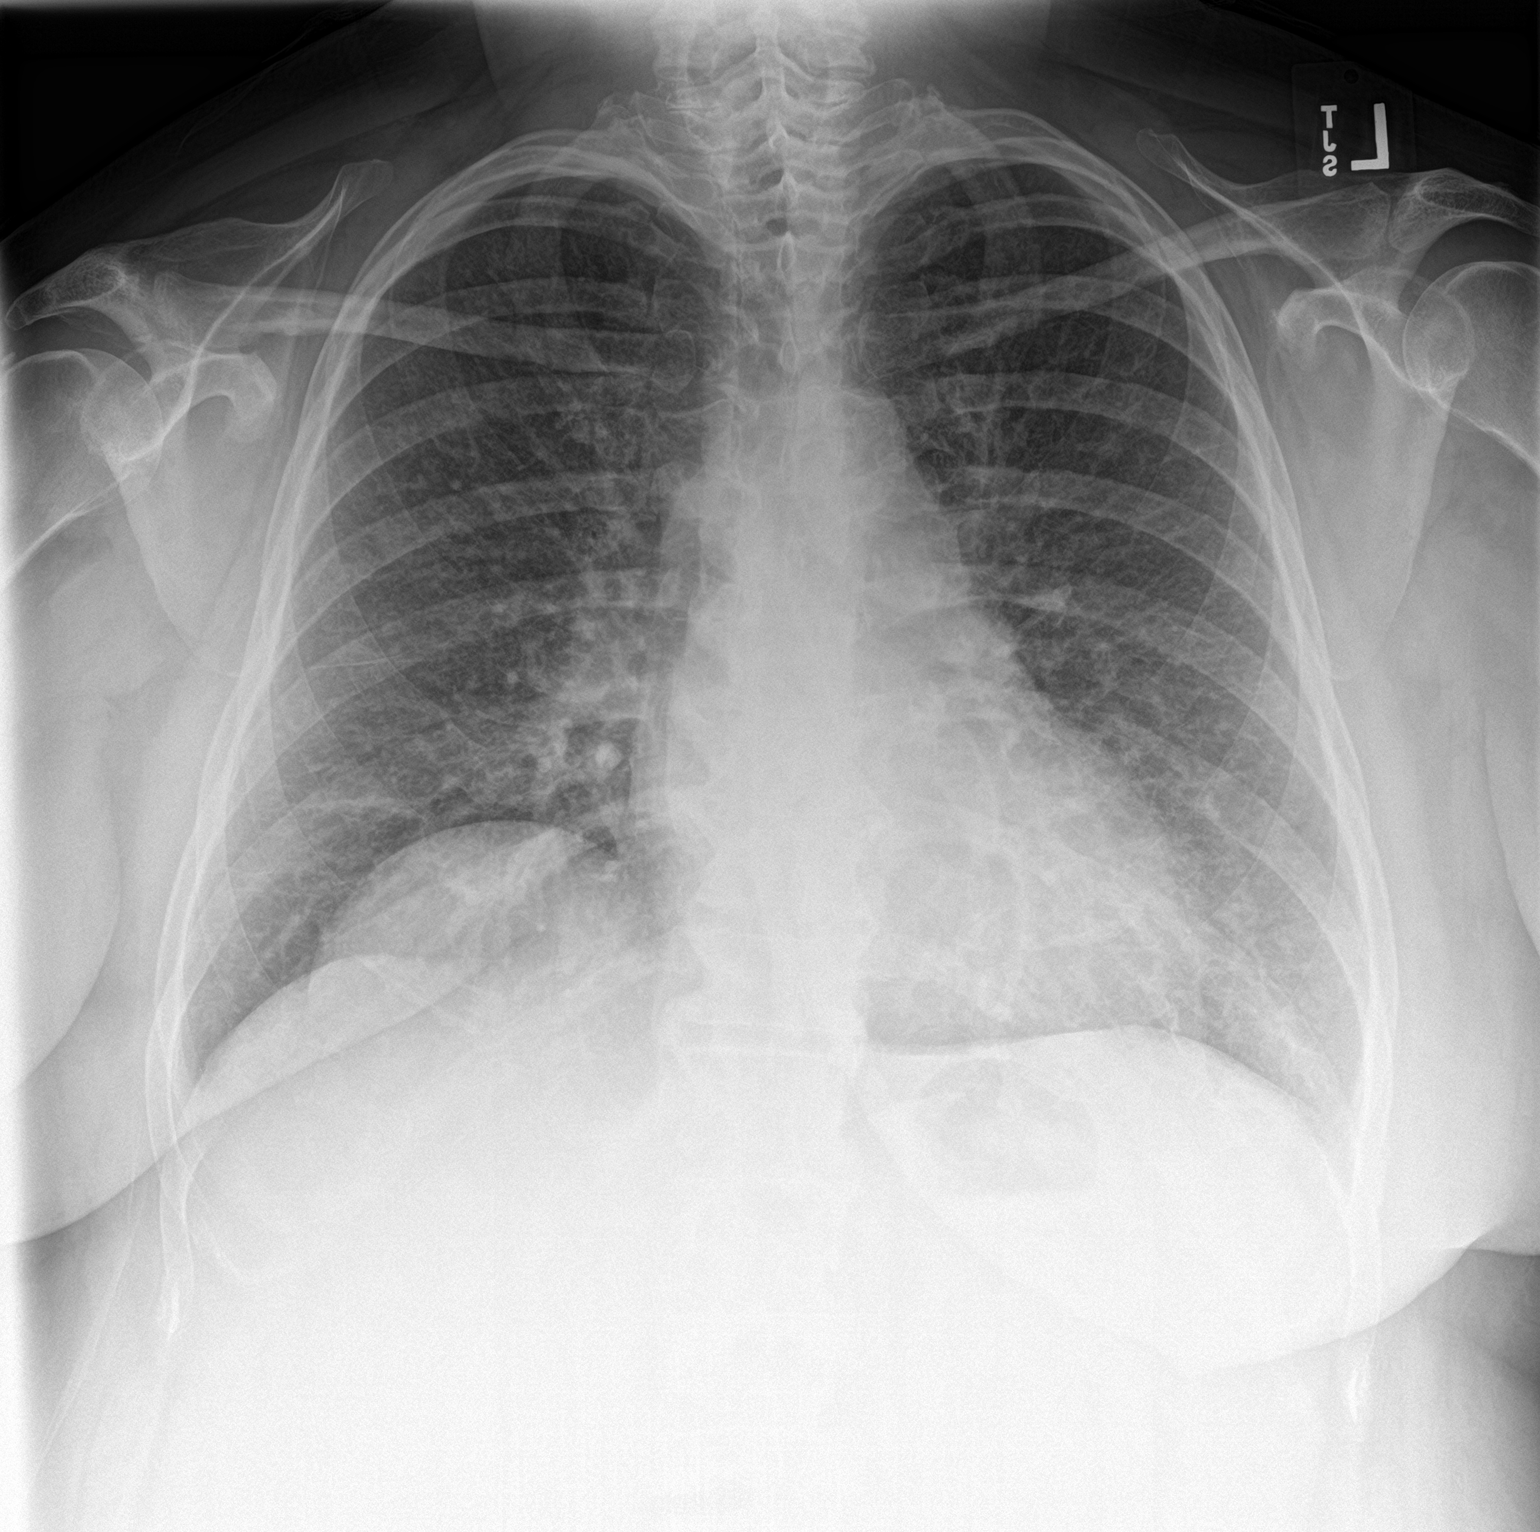
[im 2/2]
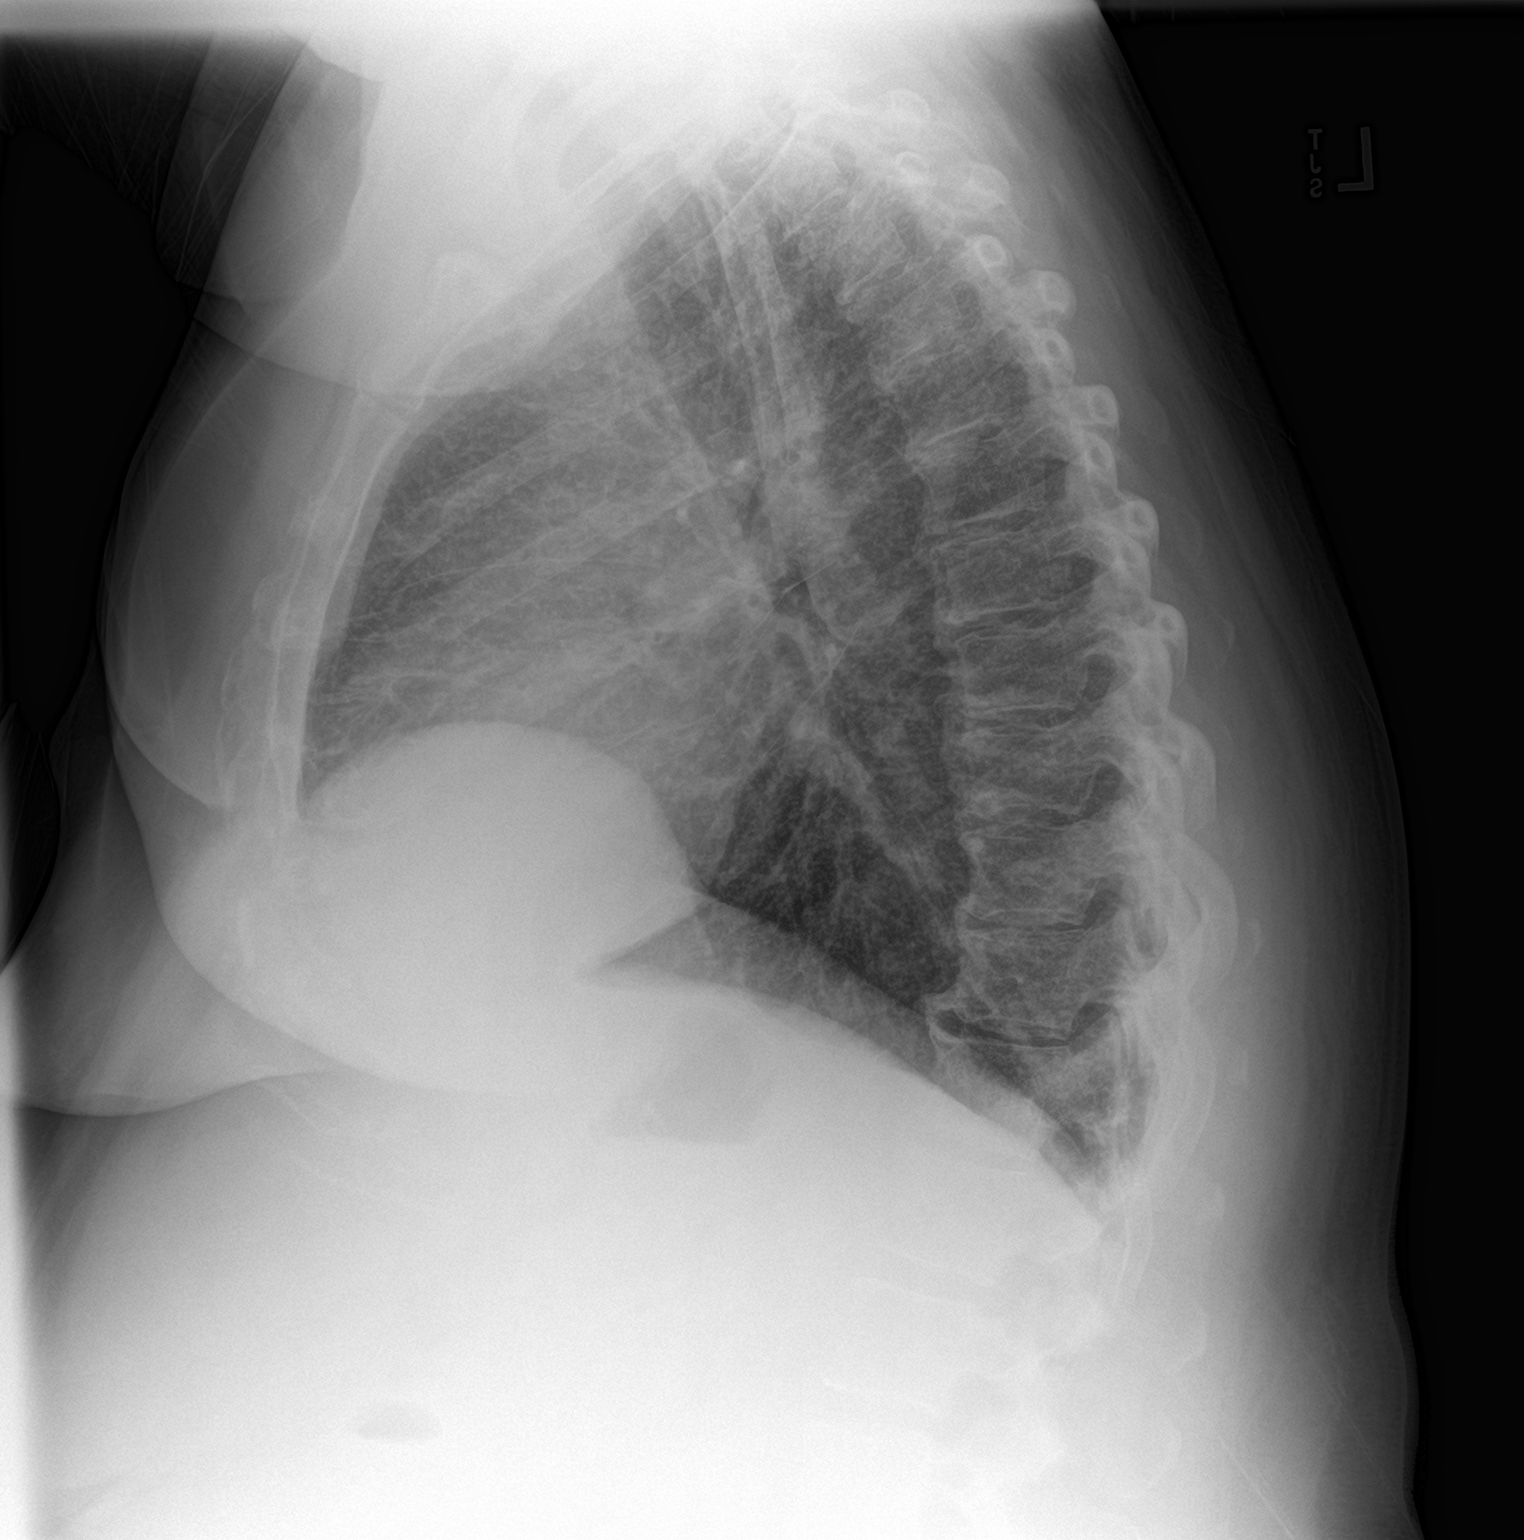

[2 of 2 positions shown; findings below may reference images not displayed]

FINDINGS: Cardiomediastinal silhouette unchanged in size and contour.

Interlobular septal thickening, increased from the comparison of
07/01/2020, and relatively new from 05/20/2020. No pneumothorax or
pleural effusion. No confluent airspace disease.

No displaced fracture.  Degenerative changes of the thoracic spine.
IMPRESSION: Progression of interlobular septal thickening/reticular opacities.
Differential includes atypical infection, edema, and, less likely,
carcinomatosis.

## 2021-04-16 ENCOUNTER — Telehealth: Payer: Self-pay | Admitting: Primary Care

## 2021-04-16 NOTE — Telephone Encounter (Signed)
Pt called in stated she was in the ED for broken rib .requesting  Muscle relaxer . Please Advise # [phone]

## 2021-04-19 ENCOUNTER — Telehealth: Payer: Self-pay | Admitting: Primary Care

## 2021-04-19 DIAGNOSIS — R519 Headache, unspecified: Secondary | ICD-10-CM | POA: Diagnosis not present

## 2021-04-19 DIAGNOSIS — S2232XS Fracture of one rib, left side, sequela: Secondary | ICD-10-CM

## 2021-04-19 NOTE — Telephone Encounter (Signed)
Pt called in wanting to know if muscle relaxer and something for pain can be sent in . Stated in a lot of pain since her fall . Please Advise # [phone]

## 2021-04-19 NOTE — Telephone Encounter (Signed)
Called patient let her know you are out of office today. States she can not wait that long. States that she has taken all pain medications that were given in ED. Advised that if her pain is not any better that she would need to go to urgent care or ED for Evaluation and pain management. She has agreed and will go now. Did offer appointment with another provider but patient declined that the drive is to far.

## 2021-04-19 NOTE — Telephone Encounter (Signed)
PLEASE NOTE: All timestamps contained within this report are represented as [place] Standard Time. CONFIDENTIALTY NOTICE: This fax transmission is intended only for the addressee. It contains information that is legally privileged, confidential or otherwise protected from use or disclosure. If you are not the intended recipient, you are strictly prohibited from reviewing, disclosing, copying using or disseminating any of this information or taking any action in reliance on or regarding this information. If you have received this fax in error, please notify us immediately by telephone so that we can arrange for its return to [place]. Phone: [phone], Toll-Free: [phone], Fax: [phone] Page: 1 of 1 Call Id: [id number] [place] RECORD [facility] Patient Name: [name] Gender: Female DOB: January 11, 1971 Age: 50 Y 2 M 25 D Return Phone Number: [phone] (Primary) Address: City/ State/ Zip: [name]  [address] Client [place] Night - Client Client Site [place] Physician Alma Friendly - NP Contact Type Call Who Is Calling Patient / Member / Family / Caregiver Call Type Triage / Clinical Relationship To Patient Self Return Phone Number [phone] (Primary) Chief Complaint Pain - Generalized Reason for Call Symptomatic / Request for [place] states she broke her ribs recently. Wants to know if a Novocain patch can be called in. Translation No Nurse Assessment Nurse: Malachi Carl, RN, Jana Half Date/Time Eilene Ghazi Time): 04/16/2021 10:06:16 PM Please select the assessment type ---Verbal order / New medication order Additional Documentation ---The caller states she broke her ribs recently. She wants to know if a Novocain patch can be called in. [facility] told her the ones behind the counter were stronger but she needs a script  for those. Does the client directives allow for assistance with medications after hours? ---No Additional Documentation ---The patient states she will go to the [facility] instead. Disp. Time Eilene Ghazi Time) Disposition Final User 04/16/2021 10:07:56 PM Clinical Call Yes Malachi Carl, RN, Jana Half

## 2021-04-19 NOTE — Telephone Encounter (Signed)
[name] with [facility] called stating that pt needs a prescription filled that the hospital put her own. [name] didn't know the name of the prescription.

## 2021-04-20 NOTE — Telephone Encounter (Signed)
Noted  

## 2021-04-20 NOTE — Telephone Encounter (Signed)
Left message to return call to our office.  

## 2021-04-21 IMAGING — CT CT CHEST W/ CM
2 of 3 series · 14 of 36 positions shown, 17 images · IV contrast (omnipaque)
Comparison: Chest CT dated 05/18/2020 and radiograph dated
07/16/2020.

CLINICAL DATA: 49-year-old female with cough.

EXAM:
CT CHEST WITH CONTRAST
TECHNIQUE: Multidetector CT imaging of the chest was performed during
intravenous contrast administration.
CONTRAST:  75mL OMNIPAQUE IOHEXOL 300 MG/ML  SOLN

[Series 2: axial st · axial · 0.73mm/px · z∈[-340,-68]mm · 11 of 160 slices shown, 14 images]
[im 12/160  mediastinal]
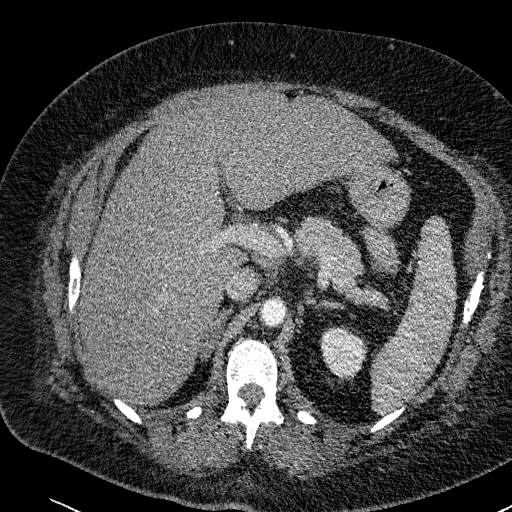
[im 12/160  lung]
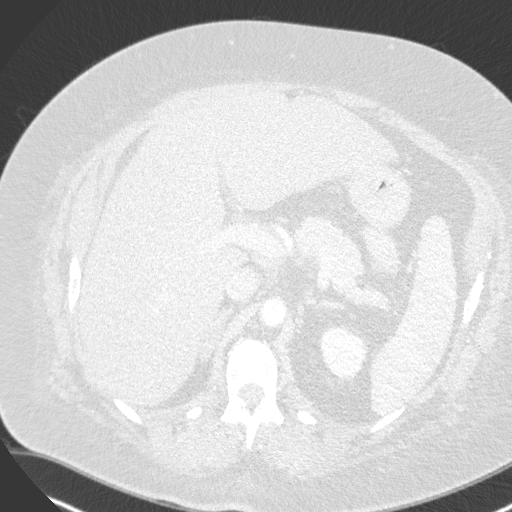
[im 24/160  lung]
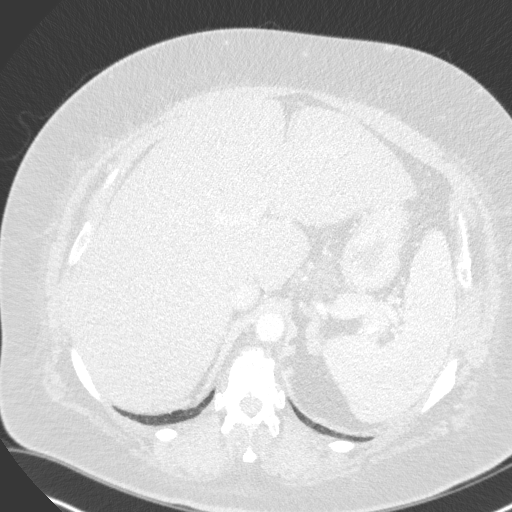
[im 36/160  lung]
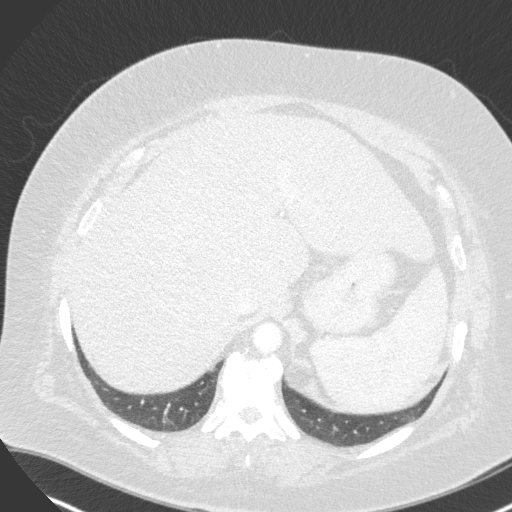
[im 54/160  lung]
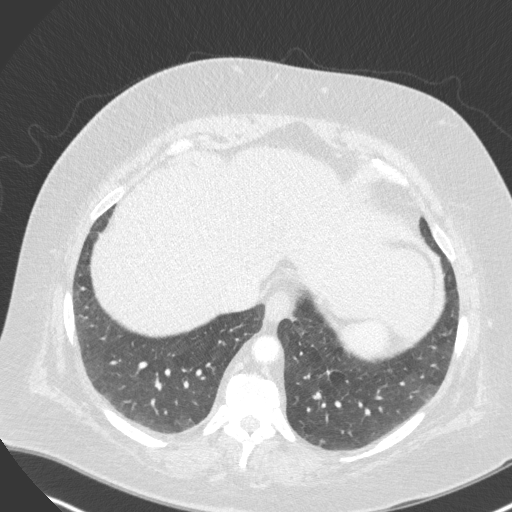
[im 65/160  mediastinal]
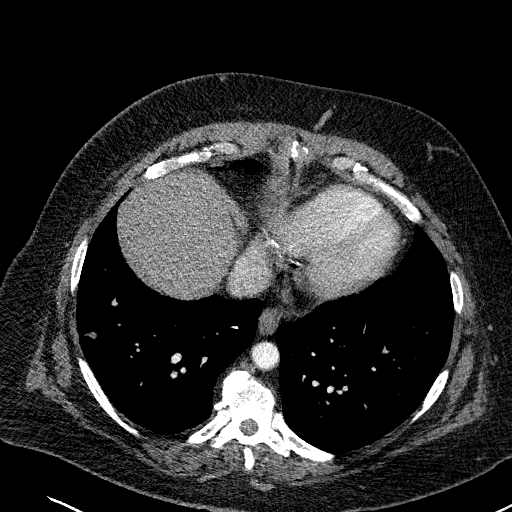
[im 65/160  lung]
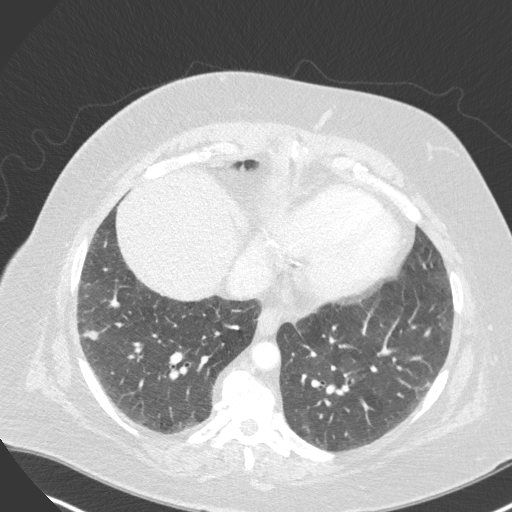
[im 83/160  lung]
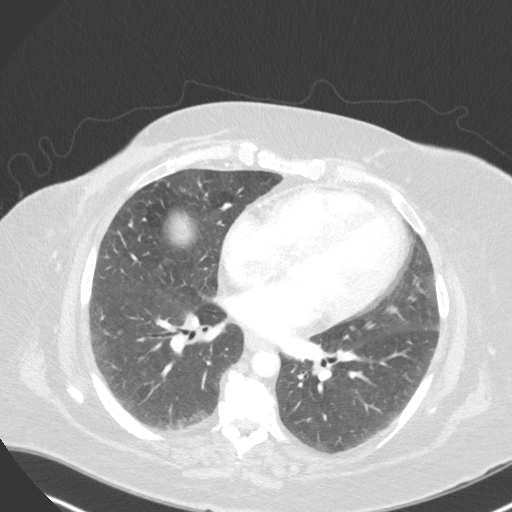
[im 95/160  lung]
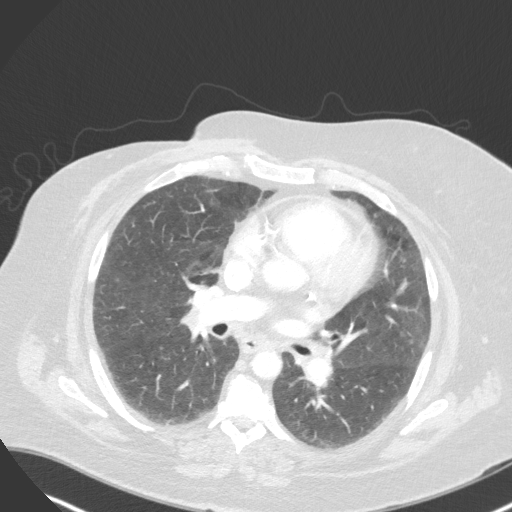
[im 107/160  lung]
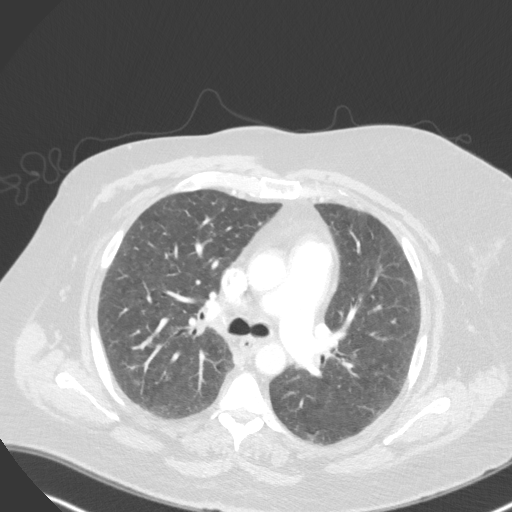
[im 124/160  mediastinal]
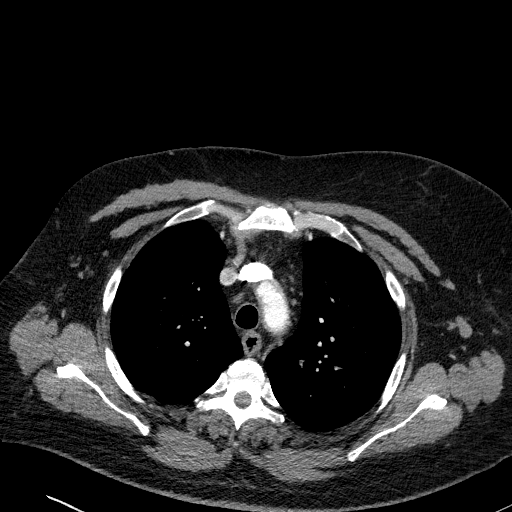
[im 124/160  lung]
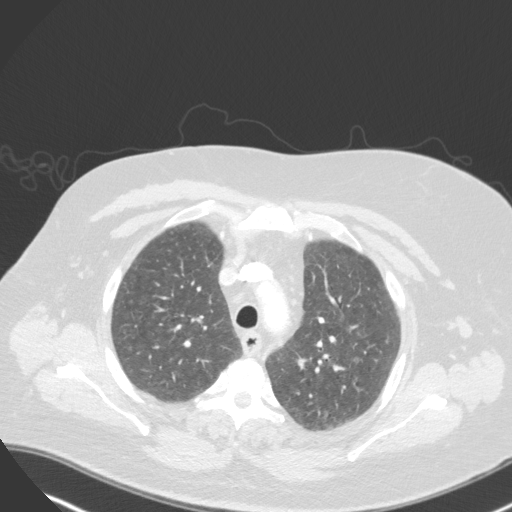
[im 136/160  lung]
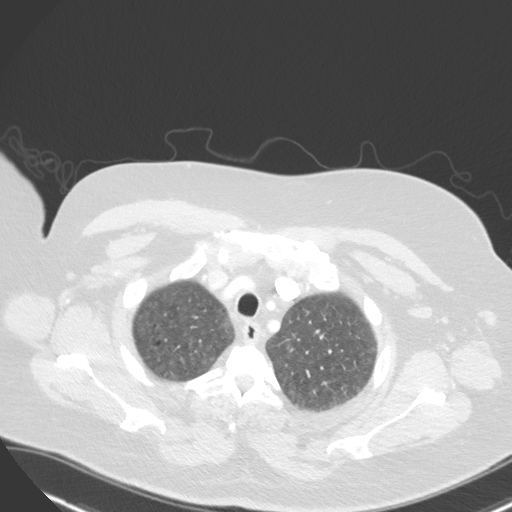
[im 148/160  lung]
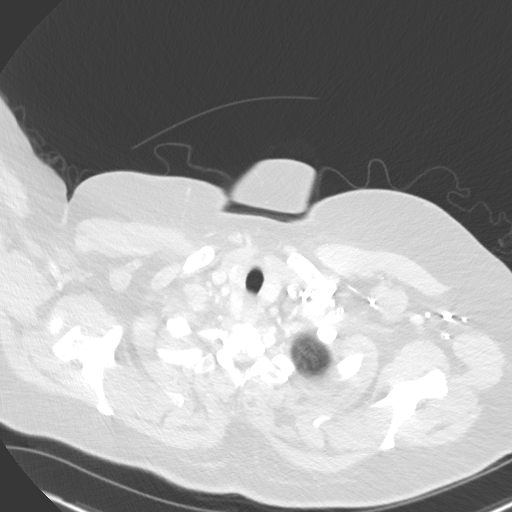

[Series 5: coronal · coronal · 0.65mm/px · 3 of 154 slices shown]
[im 31/154  lung]
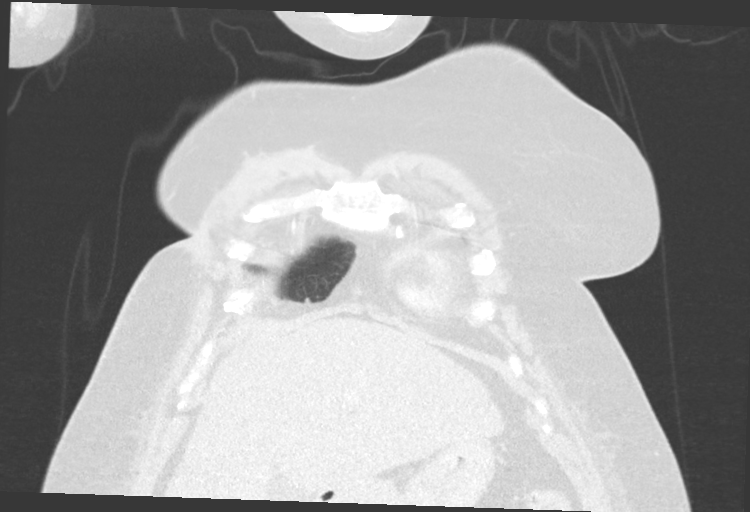
[im 62/154  lung]
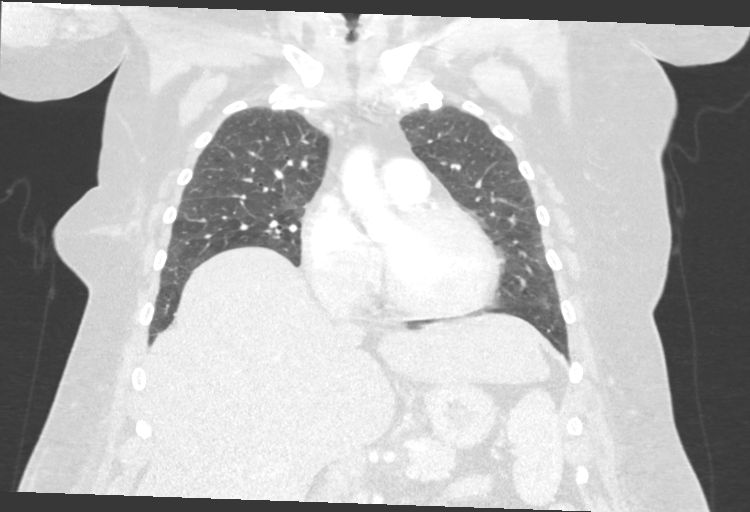
[im 92/154  lung]
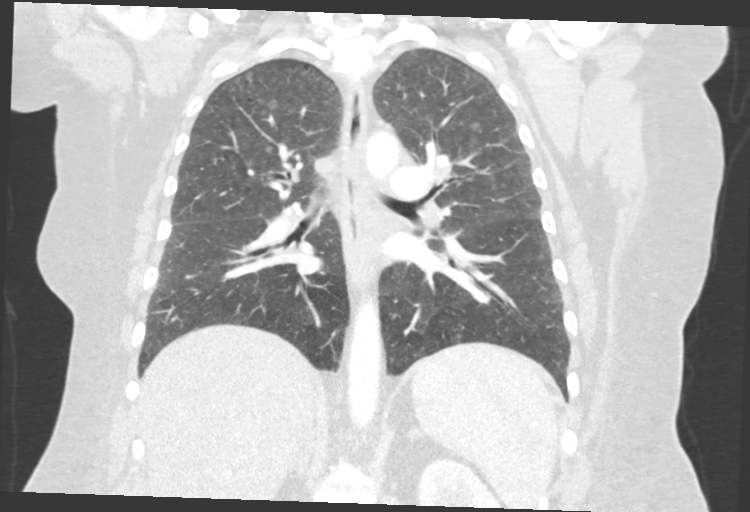

[14 of 36 positions shown; findings below may reference images not displayed]

FINDINGS: Cardiovascular: There is no cardiomegaly or pericardial effusion.
There is 3 vessel coronary vascular calcification. Mild
atherosclerotic calcification of the thoracic aorta. No aneurysmal
dilatation. The origins of the great vessels of the aortic arch
appear patent as visualized. No pulmonary artery embolus identified.

Mediastinum/Nodes: Top-normal right hilar lymph node measures 11 mm.
The esophagus is grossly unremarkable. No mediastinal fluid
collection.

Lungs/Pleura: There is mild diffuse interstitial prominence and
Kerley B-lines. There is diffuse ground-glass density throughout the
lungs. Findings may represent edema. Atypical pneumonia is not
excluded clinical correlation is recommended. Several scattered
ground-glass nodules throughout the lungs noted with the largest
measuring approximately 7 mm in the right upper lobe (62/3). An 8 mm
nodule in the right lower lobe appears similar to prior CT. No focal
consolidation, pleural effusion, pneumothorax. The central airways
are patent.

Upper Abdomen: Fatty liver.

Musculoskeletal: Degenerative changes of the spine. No acute osseous
pathology.
IMPRESSION: 1. Mild diffuse interstitial prominence and ground-glass density
throughout the lungs may represent edema. Atypical pneumonia is not
excluded. Clinical correlation is recommended.
2. No CT evidence of pulmonary artery embolus.
3. Several scattered ground-glass nodules throughout the lungs noted
with the largest measuring approximately 8 mm in the right lower
lobe. Non-contrast chest CT at 3-6 months is recommended. If the
nodules are stable at time of repeat CT, then future CT at 18-24
months (from today's scan) is considered optional for low-risk
patients, but is recommended for high-risk patients. This
recommendation follows the consensus statement: Guidelines for
Management of Incidental Pulmonary Nodules Detected on CT Images:
4. Aortic Atherosclerosis (29BC2-WUM.M).

## 2021-04-22 ENCOUNTER — Ambulatory Visit
Admission: EM | Admit: 2021-04-22 | Discharge: 2021-04-22 | Discharge status: Against Medical Advice | Payer: Medicare Other

## 2021-04-22 DIAGNOSIS — E1122 Type 2 diabetes mellitus with diabetic chronic kidney disease: Secondary | ICD-10-CM | POA: Diagnosis not present

## 2021-04-22 DIAGNOSIS — I5032 Chronic diastolic (congestive) heart failure: Secondary | ICD-10-CM | POA: Diagnosis not present

## 2021-04-22 DIAGNOSIS — I130 Hypertensive heart and chronic kidney disease with heart failure and stage 1 through stage 4 chronic kidney disease, or unspecified chronic kidney disease: Secondary | ICD-10-CM | POA: Diagnosis not present

## 2021-04-22 DIAGNOSIS — F319 Bipolar disorder, unspecified: Secondary | ICD-10-CM | POA: Diagnosis not present

## 2021-04-22 DIAGNOSIS — J449 Chronic obstructive pulmonary disease, unspecified: Secondary | ICD-10-CM | POA: Diagnosis not present

## 2021-04-22 DIAGNOSIS — R519 Headache, unspecified: Secondary | ICD-10-CM | POA: Diagnosis not present

## 2021-04-22 DIAGNOSIS — N182 Chronic kidney disease, stage 2 (mild): Secondary | ICD-10-CM | POA: Diagnosis not present

## 2021-04-22 DIAGNOSIS — L89622 Pressure ulcer of left heel, stage 2: Secondary | ICD-10-CM | POA: Diagnosis not present

## 2021-04-22 DIAGNOSIS — C50511 Malignant neoplasm of lower-outer quadrant of right female breast: Secondary | ICD-10-CM | POA: Diagnosis not present

## 2021-04-22 DIAGNOSIS — I69334 Monoplegia of upper limb following cerebral infarction affecting left non-dominant side: Secondary | ICD-10-CM | POA: Diagnosis not present

## 2021-04-22 DIAGNOSIS — E1165 Type 2 diabetes mellitus with hyperglycemia: Secondary | ICD-10-CM | POA: Diagnosis not present

## 2021-04-22 DIAGNOSIS — E1142 Type 2 diabetes mellitus with diabetic polyneuropathy: Secondary | ICD-10-CM | POA: Diagnosis not present

## 2021-04-22 DIAGNOSIS — E11622 Type 2 diabetes mellitus with other skin ulcer: Secondary | ICD-10-CM | POA: Diagnosis not present

## 2021-04-22 DIAGNOSIS — G40909 Epilepsy, unspecified, not intractable, without status epilepticus: Secondary | ICD-10-CM | POA: Diagnosis not present

## 2021-04-22 DIAGNOSIS — E1151 Type 2 diabetes mellitus with diabetic peripheral angiopathy without gangrene: Secondary | ICD-10-CM | POA: Diagnosis not present

## 2021-04-22 DIAGNOSIS — I2510 Atherosclerotic heart disease of native coronary artery without angina pectoris: Secondary | ICD-10-CM | POA: Diagnosis not present

## 2021-04-22 NOTE — Telephone Encounter (Signed)
Pt called in stating that she needs provider to call in a prescription for pain to her ribs

## 2021-04-22 NOTE — Telephone Encounter (Signed)
[name] with [facility] called back. [name] was busy. [name] can be reached at [phone]

## 2021-04-23 NOTE — Telephone Encounter (Signed)
Left message to return call to our office.  

## 2021-04-23 NOTE — Telephone Encounter (Signed)
Please advise patent was told several times that you will need to do evaluation before you are able to treat.

## 2021-04-23 NOTE — Telephone Encounter (Signed)
It looks like she sustained a nondisplaced rib fracture, ED notes reviewed.   Cannot refill narcotics, but can send prescription strength lidocaine patches and a five day course of Tramadol.   Let me know if she is agreeable.

## 2021-04-25 DIAGNOSIS — J449 Chronic obstructive pulmonary disease, unspecified: Secondary | ICD-10-CM | POA: Diagnosis not present

## 2021-04-26 ENCOUNTER — Telehealth: Payer: Self-pay | Admitting: Primary Care

## 2021-04-26 ENCOUNTER — Ambulatory Visit (INDEPENDENT_AMBULATORY_CARE_PROVIDER_SITE_OTHER): Payer: BC Managed Care – PPO | Admitting: *Deleted

## 2021-04-26 DIAGNOSIS — S2232XS Fracture of one rib, left side, sequela: Secondary | ICD-10-CM

## 2021-04-26 DIAGNOSIS — J9611 Chronic respiratory failure with hypoxia: Secondary | ICD-10-CM

## 2021-04-26 DIAGNOSIS — E119 Type 2 diabetes mellitus without complications: Secondary | ICD-10-CM

## 2021-04-26 DIAGNOSIS — F3131 Bipolar disorder, current episode depressed, mild: Secondary | ICD-10-CM

## 2021-04-26 DIAGNOSIS — J449 Chronic obstructive pulmonary disease, unspecified: Secondary | ICD-10-CM

## 2021-04-26 NOTE — Telephone Encounter (Signed)
error 

## 2021-04-26 NOTE — Telephone Encounter (Signed)
[name] called back gave information to her. She would like to have medications called in. Informed that you are out of the office today and may not address until tomorrow. She will let patient know.

## 2021-04-27 ENCOUNTER — Ambulatory Visit: Payer: Medicare Other | Admitting: Nurse Practitioner

## 2021-04-27 MED ORDER — TRAMADOL HCL 50 MG PO TABS
50.0000 mg | ORAL_TABLET | Freq: Three times a day (TID) | ORAL | 0 refills | Status: AC | PRN
Start: 2021-04-27 — End: 2021-05-02

## 2021-04-27 MED ORDER — LIDO-CAPSAICIN-MEN-METHYL SAL 0.5-0.035-5-20 % EX PTCH: MEDICATED_PATCH | CUTANEOUS | 0 refills | Status: DC

## 2021-04-27 NOTE — Chronic Care Management (AMB) (Signed)
Chronic Care Management    Clinical Social Work Note  04/27/2021 Name: [name] MRN: [id number] DOB: 1971/05/29  [name] is a 50 y.o. year old female who is a primary care patient of Pleas Koch, NP. The CCM team was consulted to assist the patient with chronic disease management and/or care coordination needs related to: Mental Health Counseling and Resources.   Engaged with patient by telephone for initial visit in response to provider referral for social work chronic care management and care coordination services.   Consent to Services:  The patient was given information about Chronic Care Management services, agreed to services, and gave verbal consent prior to initiation of services.  Please see initial visit note for detailed documentation.   Patient agreed to services and consent obtained.   Assessment: Review of patient past medical history, allergies, medications, and health status, including review of relevant consultants reports was performed today as part of a comprehensive evaluation and provision of chronic care management and care coordination services.     SDOH (Social Determinants of Health) assessments and interventions performed:    Advanced Directives Status:  "my husband knows my wishes and I have a DNR"  CCM Care Plan  Allergies  Allergen Reactions   Adhesive [Tape] Rash and Other (See Comments)    TAKES OFF THE SKIN (CERTAIN MEDICAL TAPES DO THIS!!)   Metoprolol Shortness Of Breath    Occurrence of shortness of breath after 3 days   Montelukast Shortness Of Breath   Morphine Sulfate Anaphylaxis, Shortness Of Breath and Nausea And Vomiting    Swollen Throat - Able to tolerate dilaudid   Penicillins Anaphylaxis, Hives and Shortness Of Breath    Throat swells Has patient had a PCN reaction causing immediate rash, facial/tongue/throat swelling, SOB or lightheadedness with hypotension: Yes Has patient had a PCN reaction causing severe rash  involving mucus membranes or skin necrosis: No Has patient had a PCN reaction that required hospitalization: Yes Has patient had a PCN reaction occurring within the last 10 years: No If all of the above answers are "NO", then may proceed with Cephalosporin use.    Diltiazem Swelling   Gabapentin Swelling   Midodrine     Lightheaded and falling down    Outpatient Encounter Medications as of 04/26/2021  Medication Sig Note   albuterol (VENTOLIN HFA) 108 (90 Base) MCG/ACT inhaler Inhale 2 puffs into the lungs every 6 (six) hours as needed for wheezing or shortness of breath.    ALPRAZolam (XANAX) 1 MG tablet Take 1 mg by mouth 4 (four) times daily as needed for anxiety.     anastrozole (ARIMIDEX) 1 MG tablet Take 1 tablet (1 mg total) by mouth daily.    aspirin EC 81 MG tablet Take 81 mg by mouth daily before breakfast.    blood glucose meter kit and supplies Use up to four times daily as directed. (FOR ICD-10 E10.9, E11.9).    budesonide-formoterol (SYMBICORT) 80-4.5 MCG/ACT inhaler INHALE 2 PUFFS BY MOUTH EVERY 12 HOURS TO PREVENT COUGH OR WHEEZING *RINSE MOUTH AFTER EACH USE*    busPIRone (BUSPAR) 30 MG tablet Take 30 mg by mouth 2 (two) times daily.    citalopram (CELEXA) 20 MG tablet Take 1 tablet (20 mg total) by mouth daily. 02/15/2021: Patient states she takes 2 per day.   desvenlafaxine (PRISTIQ) 100 MG 24 hr tablet Take 100 mg by mouth daily.    Desvenlafaxine ER 100 MG TB24 Take 1 tablet by mouth every morning.  diclofenac Sodium (VOLTAREN) 1 % GEL Apply 2 g topically 3 (three) times daily as needed. For pain.    Empagliflozin-metFORMIN HCl ER 25-1000 MG TB24 Take 1 tablet by mouth daily with breakfast.    EYLEA 2 MG/0.05ML SOSY     FLUoxetine (PROZAC) 40 MG capsule Take 40 mg by mouth daily. (Patient not taking: No sig reported)    glucose blood test strip Use as instructed    Insulin Pen Needle 32G X 4 MM MISC Used to give insulin injections twice daily.    insulin regular  human CONCENTRATED (HUMULIN R) 500 UNIT/ML injection Inject 30 Units into the skin 3 (three) times daily with meals. 30 U BID    Insulin Syringe-Needle U-100 (GLOBAL INJECT EASE INSULIN SYR) 30G X 1/2" 1 ML MISC See admin instructions.    Lido-Capsaicin-Men-Methyl Sal 0.5-0.035-5-20 % PTCH Apply 1 patch to the skin every 8 hours as needed for pain. Rotate sites of patch.    mupirocin ointment (BACTROBAN) 2 % Apply 1 application topically 2 (two) times daily. Apply to the affected area 2 times a day    OXYGEN Inhale 2-4 L into the lungs 2 (two) times daily as needed (shortness of breath).     pregabalin (LYRICA) 300 MG capsule Take 1 capsule (300 mg total) by mouth 2 (two) times daily. For nerve pain.    ReliOn Ultra Thin Lancets 30G MISC Use as directed for blood sugar checks.    rOPINIRole (REQUIP) 1 MG tablet TAKE 1 TABLET BY MOUTH ONCE DAILY AT BEDTIME FOR  RESTLESS  LEGS    rosuvastatin (CRESTOR) 20 MG tablet Take 1 tablet (20 mg total) by mouth daily. For cholesterol.    spironolactone (ALDACTONE) 50 MG tablet Take 50 mg by mouth daily.    topiramate (TOPAMAX) 50 MG tablet Take 1 tablet (50 mg total) by mouth 2 (two) times daily. For headache prevention.    traMADol (ULTRAM) 50 MG tablet Take 1 tablet (50 mg total) by mouth every 8 (eight) hours as needed for up to 5 days for severe pain.    traZODone (DESYREL) 100 MG tablet Take 200 mg by mouth at bedtime.    ULTICARE MINI PEN NEEDLES 31G X 6 MM MISC USE AS DIRECTED 3 TIMES DAILY    XIGDUO XR 5-500 MG TB24 Take 1 tablet by mouth every morning.    No facility-administered encounter medications on file as of 04/26/2021.    Patient Active Problem List   Diagnosis Date Noted   Headache disorder 03/08/2021   Dysuria 03/05/2021   Pressure sore 01/29/2021   Hyperlipidemia associated with type 2 diabetes mellitus ([place]) 01/13/2021   Fatigue 12/01/2020   Acute on chronic diastolic heart failure ([place]) 11/16/2020   Chronic migraine without aura  without status migrainosus, not intractable 10/01/2020   Dizziness 10/01/2020   Microscopic hematuria 07/01/2020   Pulmonary nodules 07/01/2020   Hav (hallux abducto valgus), unspecified laterality 06/11/2020   Venous ulcer of ankle, left ([place]) 05/15/2020   Contracture of hand joint 05/15/2020   Acute kidney injury superimposed on CKD ([place])    Hyperlipidemia    Carcinoma of lower-outer quadrant of right breast in female, estrogen receptor positive ([employer]) 11/12/2019   Restrictive lung disease 10/08/2019   Postmenopausal bleeding 08/30/2019   Carotid stenosis, asymptomatic, right 08/16/2019   Nausea and vomiting 07/15/2019   Cellulitis of left lower extremity 06/04/2019   Multiple wounds of skin 06/04/2019   Other injury of unspecified body region, initial encounter 06/04/2019   (  HFpEF) heart failure with preserved ejection fraction ([place]) 11/16/2018   Left-sided weakness 09/13/2018   Weakness of left lower extremity 09/05/2018   Recurrent falls 09/05/2018   PAD (peripheral artery disease) ([place]) 08/27/2018   Chronic congestive heart failure ([place]) 08/02/2018   Vaginal itching 06/15/2018   Chronic upper extremity pain (Secondary Area of Pain) (Bilateral) 04/23/2018   Diabetic polyneuropathy associated with type 2 diabetes mellitus ([place]) 04/23/2018   Long term prescription benzodiazepine use 04/23/2018   Neurogenic pain 04/23/2018   Vitamin D insufficiency 01/29/2018   Insulin dependent type 2 diabetes mellitus ([place]) 01/23/2018   DNR (do not resuscitate) 01/23/2018   CKD stage 3 due to type 2 diabetes mellitus ([place]) 01/23/2018   IBS (irritable bowel syndrome) 01/19/2018   Chronic lower extremity pain (Primary Area of Pain) (Bilateral) 01/08/2018   Chronic low back pain [facility] Area of Pain) (Bilateral) w/ sciatica (Bilateral) 01/08/2018   Chronic pain syndrome 01/08/2018   Pharmacologic therapy 01/08/2018   Disorder of skeletal system 01/08/2018   Problems influencing health status  01/08/2018   Long term current use of opiate analgesic 01/08/2018   Restless leg syndrome 08/02/2017   Nocturnal hypoxemia 03/29/2017   Vision disturbance 02/28/2017   CKD (chronic kidney disease), stage II 02/28/2017   Visual loss, bilateral    Chronic respiratory failure with hypoxia (HCC)/ nocturnal 02 dep  10/28/2016   Upper airway cough syndrome 08/22/2016   Cigarette smoker 06/15/2016   Simple chronic bronchitis ([place])    Coronary artery disease involving native heart without angina pectoris 05/16/2016   Chronic diastolic CHF (congestive heart failure) ([place]) 11/04/2015   Orthopnea 11/03/2015   Bilateral leg edema    Diabetes ([place])    COPD exacerbation ([place]) 10/15/2015   Fracture of rib, closed 10/15/2015   Venous stasis of both lower extremities 03/29/2015   Preventative health care 02/04/2015   Hypertension associated with diabetes ([place]) 01/02/2015   Inguinal hernia 11/27/2014   Allergic rhinitis with postnasal drip 01/21/2014   Aortic valve disorders 04/18/2013   Sleep apnea 05/22/2012   Syncope and collapse 10/09/2011   Seizure disorder ([place]) 10/09/2011   Bipolar disorder ([place]) 10/09/2011   TIA (transient ischemic attack) 06/22/2011   Constipation 06/15/2011   HYPERTRIGLYCERIDEMIA 07/17/2007   Situational anxiety 05/30/2007   COPD with chronic bronchitis ([place]) 05/30/2007   Morbid (severe) obesity due to excess calories ([place]) 04/23/2007   Tobacco abuse 09/07/2006    Conditions to be addressed/monitored: Anxiety, Depression, and Bipolar Disorder; Mental Health Concerns   Care Plan : LCSW Plan of Care  Updates made by Deirdre Peer, LCSW since 04/27/2021 12:00 AM     Problem: Stress, anxiety and depression   Priority: High     Long-Range Goal: Provide resources and support to reduce symptoms (anxiety/depression)   Start Date: 04/26/2021  Expected End Date: 06/09/2021  This Visit's Progress: On track  Priority: High  Note:   Current Barriers:  Chronic  Mental Health needs related to medical condition, history of Bipolar D/O, anxiety Limited social support, Mental Health Concerns , and Lacks knowledge of community resource:   Suicidal Ideation/Homicidal Ideation: No  Clinical Social Work Goal(s):  patient will work with SW  PRN  by telephone to reduce or manage symptoms related to mental health needs demonstrate a reduction in symptoms related to :Anxiety with Excessive Worry, Panic Symptoms,, Bipolar Disorder, and anxiety   Interventions: CSW spoke with pt who reports a history of Bipolar D/O. Pt is on Buspar and Xanax and feels  this is helping. Pt also has good family support and states "my husband tells me to go to my happy place".  Discussed coping skills and other ways to deal with anxiety as it appears- she has a Teacher, music (Dr [employer]) who she sees every 6 months.  Pt indicates she is open to counseling support and would like to do in person visits.  CSW discussed referral to [facility] and she agrees to this plan.   Pt is s/p fall and reports she has several rib fx's- she fell on her ramp (no rails).  Her outpt PT and OT are on hold due to this injury.   Patient interviewed and appropriate assessments performed: brief mental health assessment 1:1 collaboration with Pleas Koch, NP regarding development and update of comprehensive plan of care as evidenced by provider attestation and co-signature Patient interviewed and appropriate assessments performed Referred patient to [facility] ([facility] health provider) for long term follow up and therapy/counseling Solution-Focused Strategies  Active listening / Reflection utilized  Emotional Support Provided Problem [place] strategies reviewed Participation in counseling encouraged    Patient Self Care Activities:  Self administers medications as prescribed Attends all scheduled provider appointments Calls pharmacy for medication refills Calls provider office for new concerns or  questions Motivation for treatment Strong family or social support  Patient Coping Strengths:  Supportive Relationships Family Hopefulness  Patient Self Care Deficits:  Needs help getting linked with support services/counseling  Patient Goals:  -expect call from [place] to arrange in-person counseling - avoid negative self-talk - develop a personal safety plan - have a plan for how to handle bad days - spend time or talk with others at least 2 to 3 times per week - watch for early signs of feeling worse - write in journal every day - begin personal counseling - check out volunteer opportunities - learn and use visualization or guided imagery - call to cancel if needed - keep a calendar with prescription refill dates - keep a calendar with appointment dates  Follow Up Plan: SW will follow up with patient by phone over the next 30 days        Follow Up Plan: SW will follow up with patient by phone over the next 30 days      Eduard Clos MSW, [place] Licensed Clinical Social Worker [place] [phone]

## 2021-04-27 NOTE — Addendum Note (Signed)
Addended by: Pleas Koch on: 04/27/2021 07:29 AM   Modules accepted: Orders

## 2021-04-27 NOTE — Telephone Encounter (Signed)
Noted, medications sent to pharmacy.

## 2021-04-27 NOTE — Patient Instructions (Signed)
Visit Information  PATIENT GOALS:  Goals Addressed               This Visit's Progress     Begin and Stick with Counseling-Depression (pt-stated)        Timeframe:  Long-Range Goal Priority:  High Start Date:     04/26/21                        Expected End Date:   06/09/21                    Follow Up Date 05/19/21    - check out counseling - keep 90 percent of counseling appointments - schedule counseling appointment    Why is this important?   Beating depression may take some time.  If you don't feel better right away, don't give up on your treatment plan.    Notes:         The patient verbalized understanding of instructions, educational materials, and care plan provided today and declined offer to receive copy of patient instructions, educational materials, and care plan.   Telephone follow up appointment with care management team member scheduled for: 05/25/21  Eduard Clos MSW, LCSW Licensed Clinical Social Worker [place] [phone]

## 2021-04-28 ENCOUNTER — Telehealth: Payer: Self-pay | Admitting: Primary Care

## 2021-04-28 DIAGNOSIS — S2232XS Fracture of one rib, left side, sequela: Secondary | ICD-10-CM

## 2021-04-28 MED ORDER — LIDO-CAPSAICIN-MEN-METHYL SAL 0.5-0.035-5-20 % EX PTCH: MEDICATED_PATCH | CUTANEOUS | 0 refills | Status: DC

## 2021-04-28 NOTE — Telephone Encounter (Signed)
Called pharmacy they never received script would like for you to resend

## 2021-04-28 NOTE — Telephone Encounter (Signed)
Prescription resent to the pharmacy.  

## 2021-04-28 NOTE — Telephone Encounter (Signed)
Pt called in stated Pharmacy in unable to fill RX Lido-Capsaicin-Men-Methyl Sal 0.5-0.035-5-20 % PTCH need new prescription please call pharmacy it dose not come in a patch . Please advise

## 2021-04-29 ENCOUNTER — Encounter: Payer: BC Managed Care – PPO | Attending: Physician Assistant | Admitting: Physician Assistant

## 2021-04-29 ENCOUNTER — Other Ambulatory Visit: Payer: Self-pay

## 2021-04-29 DIAGNOSIS — I110 Hypertensive heart disease with heart failure: Secondary | ICD-10-CM | POA: Insufficient documentation

## 2021-04-29 DIAGNOSIS — I890 Lymphedema, not elsewhere classified: Secondary | ICD-10-CM | POA: Insufficient documentation

## 2021-04-29 DIAGNOSIS — J449 Chronic obstructive pulmonary disease, unspecified: Secondary | ICD-10-CM | POA: Insufficient documentation

## 2021-04-29 DIAGNOSIS — Z86718 Personal history of other venous thrombosis and embolism: Secondary | ICD-10-CM | POA: Diagnosis not present

## 2021-04-29 DIAGNOSIS — L03116 Cellulitis of left lower limb: Secondary | ICD-10-CM | POA: Diagnosis not present

## 2021-04-29 DIAGNOSIS — I872 Venous insufficiency (chronic) (peripheral): Secondary | ICD-10-CM | POA: Insufficient documentation

## 2021-04-29 DIAGNOSIS — Z6841 Body Mass Index (BMI) 40.0 and over, adult: Secondary | ICD-10-CM | POA: Diagnosis not present

## 2021-04-29 DIAGNOSIS — L89623 Pressure ulcer of left heel, stage 3: Secondary | ICD-10-CM | POA: Insufficient documentation

## 2021-04-29 DIAGNOSIS — E1142 Type 2 diabetes mellitus with diabetic polyneuropathy: Secondary | ICD-10-CM | POA: Insufficient documentation

## 2021-04-29 DIAGNOSIS — Z794 Long term (current) use of insulin: Secondary | ICD-10-CM | POA: Insufficient documentation

## 2021-04-29 DIAGNOSIS — I2510 Atherosclerotic heart disease of native coronary artery without angina pectoris: Secondary | ICD-10-CM | POA: Insufficient documentation

## 2021-04-29 DIAGNOSIS — D0581 Other specified type of carcinoma in situ of right breast: Secondary | ICD-10-CM | POA: Diagnosis not present

## 2021-04-29 DIAGNOSIS — E669 Obesity, unspecified: Secondary | ICD-10-CM | POA: Insufficient documentation

## 2021-04-29 DIAGNOSIS — I5032 Chronic diastolic (congestive) heart failure: Secondary | ICD-10-CM | POA: Insufficient documentation

## 2021-04-29 DIAGNOSIS — Z880 Allergy status to penicillin: Secondary | ICD-10-CM | POA: Insufficient documentation

## 2021-04-29 NOTE — Progress Notes (Addendum)
POPPI, SCANTLING ([id number]) Visit Report for 04/29/2021 Arrival Information Details Patient Name: [name], [name] Date of Service: 04/29/2021 10:00 AM Medical Record Number: [id number] Patient Account Number: [id number] Date of Birth/Sex: 16-Feb-1971 (50 y.o. F) Treating RN: Dolan Amen Primary Care Ezana Hubbert: Alma Friendly Other Clinician: Referring Rozlynn Lippold: Alma Friendly Treating Alveta Quintela/Extender: Skipper Cliche in Treatment: 12 Visit Information History Since Last Visit Pain Present Now: No Patient Arrived: Ambulatory Arrival Time: 10:28 Accompanied By: husband Transfer Assistance: None Patient Identification Verified: Yes Secondary Verification Process Completed: Yes Patient Has Alerts: Yes Patient Alerts: 325 aspirin Type II Diabetic Electronic Signature(s) Signed: 04/29/2021 4:40:38 PM By: Dolan Amen RN Entered By: Dolan Amen on 04/29/2021 10:29:00 [name] ([id number]) -------------------------------------------------------------------------------- Clinic Level of Care Assessment Details Patient Name: [name] Date of Service: 04/29/2021 10:00 AM Medical Record Number: [id number] Patient Account Number: [id number] Date of Birth/Sex: 08-Sep-1970 (50 y.o. F) Treating RN: Dolan Amen Primary Care Kavitha Lansdale: Alma Friendly Other Clinician: Referring Sharissa Brierley: Alma Friendly Treating Mima Cranmore/Extender: Skipper Cliche in Treatment: 12 Clinic Level of Care Assessment Items TOOL 4 Quantity Score X - Use when only an EandM is performed on FOLLOW-UP visit 1 0 ASSESSMENTS - Nursing Assessment / Reassessment X - Reassessment of Co-morbidities (includes updates in patient status) 1 10 X- 1 5 Reassessment of Adherence to Treatment Plan ASSESSMENTS - Wound and Skin Assessment / Reassessment []  - Simple Wound Assessment / Reassessment - one wound 0 []  - 0 Complex Wound Assessment / Reassessment - multiple wounds []  - 0 Dermatologic / Skin  Assessment (not related to wound area) ASSESSMENTS - Focused Assessment []  - Circumferential Edema Measurements - multi extremities 0 []  - 0 Nutritional Assessment / Counseling / Intervention []  - 0 Lower Extremity Assessment (monofilament, tuning fork, pulses) []  - 0 Peripheral Arterial Disease Assessment (using hand held doppler) ASSESSMENTS - Ostomy and/or Continence Assessment and Care []  - Incontinence Assessment and Management 0 []  - 0 Ostomy Care Assessment and Management (repouching, etc.) PROCESS - Coordination of Care X - Simple Patient / Family Education for ongoing care 1 15 []  - 0 Complex (extensive) Patient / Family Education for ongoing care []  - 0 Staff obtains Programmer, systems, Records, Test Results / Process Orders []  - 0 Staff telephones HHA, Nursing Homes / Clarify orders / etc []  - 0 Routine Transfer to another Facility (non-emergent condition) []  - 0 Routine Hospital Admission (non-emergent condition) []  - 0 New Admissions / Biomedical engineer / Ordering NPWT, Apligraf, etc. []  - 0 Emergency Hospital Admission (emergent condition) X- 1 10 Simple Discharge Coordination []  - 0 Complex (extensive) Discharge Coordination PROCESS - Special Needs []  - Pediatric / Minor Patient Management 0 []  - 0 Isolation Patient Management []  - 0 Hearing / Language / Visual special needs []  - 0 Assessment of Community assistance (transportation, D/C planning, etc.) []  - 0 Additional assistance / Altered mentation []  - 0 Support Surface(s) Assessment (bed, cushion, seat, etc.) INTERVENTIONS - Wound Cleansing / Measurement Quintin, Ciara ([id number]) []  - 0 Simple Wound Cleansing - one wound []  - 0 Complex Wound Cleansing - multiple wounds []  - 0 Wound Imaging (photographs - any number of wounds) []  - 0 Wound Tracing (instead of photographs) []  - 0 Simple Wound Measurement - one wound []  - 0 Complex Wound Measurement - multiple wounds INTERVENTIONS - Wound  Dressings []  - Small Wound Dressing one or multiple wounds 0 []  - 0 Medium Wound Dressing one or multiple wounds []  - 0 Large Wound Dressing one or multiple wounds []  -  0 Application of Medications - topical []  - 0 Application of Medications - injection INTERVENTIONS - Miscellaneous []  - External ear exam 0 []  - 0 Specimen Collection (cultures, biopsies, blood, body fluids, etc.) []  - 0 Specimen(s) / Culture(s) sent or taken to Lab for analysis []  - 0 Patient Transfer (multiple staff / Harrel Lemon Lift / Similar devices) []  - 0 Simple Staple / Suture removal (25 or less) []  - 0 Complex Staple / Suture removal (26 or more) []  - 0 Hypo / Hyperglycemic Management (close monitor of Blood Glucose) []  - 0 Ankle / Brachial Index (ABI) - do not check if billed separately X- 1 5 Vital Signs Has the patient been seen at the hospital within the last three years: Yes Total Score: 45 Level Of Care: New/Established - Level 2 Electronic Signature(s) Signed: 04/29/2021 4:40:38 PM By: Dolan Amen RN Entered By: Dolan Amen on 04/29/2021 10:51:09 [place], [name] ([id number]) -------------------------------------------------------------------------------- Complex / Palliative Patient Assessment Details Patient Name: [name] Date of Service: 04/29/2021 10:00 AM Medical Record Number: [id number] Patient Account Number: [id number] Date of Birth/Sex: [birth year] (50 y.o. F) Treating RN: Cornell Barman Primary Care Takyia Sindt: Alma Friendly Other Clinician: Referring Cherri Yera: Alma Friendly Treating Addalynn Kumari/Extender: Skipper Cliche in Treatment: 12 Palliative Management Criteria Complex Wound Management Criteria Patient has remarkable or complex co-morbidities requiring medications or treatments that extend wound healing times. Examples: o Diabetes mellitus with chronic renal failure or end stage renal disease requiring dialysis o Advanced or poorly controlled rheumatoid  arthritis o Diabetes mellitus and end stage chronic obstructive pulmonary disease o Active cancer with current chemo- or radiation therapy dm, copd, left sided weakness, anemia Care Approach Wound Care Plan: Complex Wound Management Electronic Signature(s) Signed: 06/24/2021 2:15:15 PM By: Gretta Cool, BSN, RN, CWS, Kim RN, BSN Signed: 07/02/2021 12:11:21 PM By: Worthy Keeler PA-C Entered By: Gretta Cool, BSN, RN, CWS, Kim on 06/24/2021 14:15:15 [name] ([id number]) -------------------------------------------------------------------------------- Lower Extremity Assessment Details Patient Name: [name] Date of Service: 04/29/2021 10:00 AM Medical Record Number: [id number] Patient Account Number: [id number] Date of Birth/Sex: 31-Oct-1970 (50 y.o. F) Treating RN: Dolan Amen Primary Care Izaiyah Kleinman: Alma Friendly Other Clinician: Referring Khristen Cheyney: Alma Friendly Treating Diogo Anne/Extender: Skipper Cliche in Treatment: 12 Vascular Assessment Pulses: Dorsalis Pedis Palpable: [Left:Yes] Electronic Signature(s) Signed: 04/29/2021 4:40:38 PM By: Dolan Amen RN Entered By: Dolan Amen on 04/29/2021 10:34:44 [place], [name] ([id number]) -------------------------------------------------------------------------------- Multi Wound Chart Details Patient Name: [name] Date of Service: 04/29/2021 10:00 AM Medical Record Number: [id number] Patient Account Number: [id number] Date of Birth/Sex: 04/22/1971 (50 y.o. F) Treating RN: Dolan Amen Primary Care Aizlyn Schifano: Alma Friendly Other Clinician: Referring Gissele Narducci: Alma Friendly Treating Makoa Satz/Extender: Skipper Cliche in Treatment: 12 Vital Signs Height(in): 47 Pulse(bpm): 60 Weight(lbs): 242 Blood Pressure(mmHg): 139/85 Body Mass Index(BMI): 42 Temperature(F): 97.5 Respiratory Rate(breaths/min): 16 Photos: [N/A:N/A] Wound Location: Left Calcaneus N/A N/A Wounding Event: Blister N/A N/A Primary  Etiology: Pressure Ulcer N/A N/A Comorbid History: Asthma, Chronic Obstructive N/A N/A Pulmonary Disease (COPD), Sleep Apnea, Arrhythmia, Congestive Heart Failure, Coronary Artery Disease, Hypertension, Type II Diabetes, Neuropathy, Received Chemotherapy, Received Radiation Date Acquired: 01/13/2021 N/A N/A Weeks of Treatment: 12 N/A N/A Wound Status: Healed - Epithelialized N/A N/A Measurements L x W x D (cm) 0x0x0 N/A N/A Area (cm) : 0 N/A N/A Volume (cm) : 0 N/A N/A % Reduction in Area: 100.00% N/A N/A % Reduction in Volume: 100.00% N/A N/A Classification: Category/Stage III N/A N/A Exudate Amount: Small N/A N/A Exudate Type: Sanguinous N/A N/A Exudate Color: red  N/A N/A Granulation Amount: None Present (0%) N/A N/A Necrotic Amount: None Present (0%) N/A N/A Exposed Structures: Fascia: No N/A N/A Fat Layer (Subcutaneous Tissue): No Tendon: No Muscle: No Joint: No Bone: No Epithelialization: Large (67-100%) N/A N/A Treatment Notes Electronic Signature(s) Signed: 04/29/2021 4:40:38 PM By: Dolan Amen RN Entered By: Dolan Amen on 04/29/2021 10:43:32 [place], [name] ([id number]) -------------------------------------------------------------------------------- Multi-Disciplinary Care Plan Details Patient Name: [name] Date of Service: 04/29/2021 10:00 AM Medical Record Number: [id number] Patient Account Number: [id number] Date of Birth/Sex: [birth year] (50 y.o. F) Treating RN: Dolan Amen Primary Care Clary Meeker: Alma Friendly Other Clinician: Referring Shaquilla Kehres: Alma Friendly Treating Kayli Beal/Extender: Skipper Cliche in Treatment: 12 Active Inactive Electronic Signature(s) Signed: 04/29/2021 4:40:38 PM By: Dolan Amen RN Entered By: Dolan Amen on 04/29/2021 10:43:22 [place], [name] ([id number]) -------------------------------------------------------------------------------- Pain Assessment Details Patient Name: [name] Date of  Service: 04/29/2021 10:00 AM Medical Record Number: [id number] Patient Account Number: [id number] Date of Birth/Sex: 23-Nov-1970 (50 y.o. F) Treating RN: Dolan Amen Primary Care Keatin Benham: Alma Friendly Other Clinician: Referring Bonniejean Piano: Alma Friendly Treating [name]/Extender: Skipper Cliche in Treatment: 12 Active Problems Location of Pain Severity and Description of Pain Patient Has Paino No Site Locations Pain Management and Medication Current Pain Management: Electronic Signature(s) Signed: 04/29/2021 4:40:38 PM By: Dolan Amen RN Entered By: Dolan Amen on 04/29/2021 10:30:48 [name] ([id number]) -------------------------------------------------------------------------------- Patient/Caregiver Education Details Patient Name: [name] Date of Service: 04/29/2021 10:00 AM Medical Record Number: [id number] Patient Account Number: [id number] Date of Birth/Gender: [birth year] (50 y.o. F) Treating RN: Dolan Amen Primary Care Physician: Alma Friendly Other Clinician: Referring Physician: Alma Friendly Treating Physician/Extender: Skipper Cliche in Treatment: 12 Education Assessment Education Provided To: Patient Education Topics Provided Notes discharge instructions Electronic Signature(s) Signed: 04/29/2021 4:40:38 PM By: Dolan Amen RN Entered By: Dolan Amen on 04/29/2021 10:51:46 [place], [name] ([id number]) -------------------------------------------------------------------------------- Wound Assessment Details Patient Name: [name] Date of Service: 04/29/2021 10:00 AM Medical Record Number: [id number] Patient Account Number: [id number] Date of Birth/Sex: [birth year] (50 y.o. F) Treating RN: Dolan Amen Primary Care Palin Tristan: Alma Friendly Other Clinician: Referring Lysette Lindenbaum: Alma Friendly Treating Zeidy Tayag/Extender: Skipper Cliche in Treatment: 12 Wound Status Wound Number: 9 Primary Pressure  Ulcer Etiology: Wound Location: Left Calcaneus Wound Healed - Epithelialized Wounding Event: Blister Status: Date Acquired: 01/13/2021 Comorbid Asthma, Chronic Obstructive Pulmonary Disease (COPD), Weeks Of Treatment: 12 History: Sleep Apnea, Arrhythmia, Congestive Heart Failure, Clustered Wound: No Coronary Artery Disease, Hypertension, Type II Diabetes, Neuropathy, Received Chemotherapy, Received Radiation Photos Wound Measurements Length: (cm) 0 Width: (cm) 0 Depth: (cm) 0 Area: (cm) 0 Volume: (cm) 0 % Reduction in Area: 100% % Reduction in Volume: 100% Epithelialization: Large (67-100%) Tunneling: No Undermining: No Wound Description Classification: Category/Stage III Fou Exudate Amount: Small Slo Exudate Type: Sanguinous Exudate Color: red l Odor After Cleansing: No ugh/Fibrino Yes Wound Bed Granulation Amount: None Present (0%) Exposed Structure Necrotic Amount: None Present (0%) Fascia Exposed: No Fat Layer (Subcutaneous Tissue) Exposed: No Tendon Exposed: No Muscle Exposed: No Joint Exposed: No Bone Exposed: No Treatment Notes Wound #9 (Calcaneus) Wound Laterality: Left Cleanser Peri-Wound Care Topical LUISE, YAMAMOTO ([id number]) Primary Dressing Secondary Dressing Secured With Compression Wrap Compression Stockings Add-Ons Electronic Signature(s) Signed: 04/29/2021 4:40:38 PM By: Dolan Amen RN Entered By: Dolan Amen on 04/29/2021 10:43:00 [place], [name] ([id number]) -------------------------------------------------------------------------------- Vitals Details Patient Name: [name] Date of Service: 04/29/2021 10:00 AM Medical Record Number: [id number] Patient Account Number: [id number] Date of Birth/Sex: 04/15/1971 (50 y.o. F) Treating RN: Dolan Amen Primary Care  Jack Bolio: Alma Friendly Other Clinician: Referring Zannah Melucci: Alma Friendly Treating Makenley Shimp/Extender: Skipper Cliche in Treatment: 12 Vital Signs Time  Taken: 10:29 Temperature (F): 97.5 Height (in): 64 Pulse (bpm): 72 Weight (lbs): 242 Respiratory Rate (breaths/min): 16 Body Mass Index (BMI): 41.5 Blood Pressure (mmHg): 139/85 Reference Range: 80 - 120 mg / dl Electronic Signature(s) Signed: 04/29/2021 4:40:38 PM By: Dolan Amen RN Entered By: Dolan Amen on 04/29/2021 10:30:37

## 2021-04-29 NOTE — Progress Notes (Addendum)
[name], REACH ([id number]) Visit Report for 04/29/2021 Chief Complaint Document Details Patient Name: [name], [name] Date of Service: 04/29/2021 10:00 AM Medical Record Number: [id number] Patient Account Number: [id number] Date of Birth/Sex: 1970-08-20 (50 y.o. F) Treating RN: Dolan Amen Primary Care Provider: Alma Friendly Other Clinician: Referring Provider: Alma Friendly Treating Provider/Extender: Skipper Cliche in Treatment: 12 Information Obtained from: Patient Chief Complaint Left Heel Ulcer Electronic Signature(s) Signed: 04/29/2021 10:39:23 AM By: Worthy Keeler PA-C Entered By: Worthy Keeler on 04/29/2021 10:39:22 DEMEKA, SUTTER ([id number]) -------------------------------------------------------------------------------- HPI Details Patient Name: [name] Date of Service: 04/29/2021 10:00 AM Medical Record Number: [id number] Patient Account Number: [id number] Date of Birth/Sex: 1970-09-23 (50 y.o. F) Treating RN: Dolan Amen Primary Care Provider: Alma Friendly Other Clinician: Referring Provider: Alma Friendly Treating Provider/Extender: Skipper Cliche in Treatment: 12 History of Present Illness HPI Description: ADMISSION 06/19/2019 Patient is a 50 year old woman who is accompanied by her husband. She has severe diabetic peripheral neuropathy which is left her minimally ambulatory. She spends most of the time in a wheelchair. She also has a history of chronic lower extremity edema history of left leg DVT. She states that on 2 separate occasions roughly a month ago she fell with lacerations on the bilateral anterior tibial areas. She was seen by primary care on 06/04/2019 diagnosed with cellulitis of the left leg put on Bactrim DS. Also noted to have hand wounds with a question of referral to hand surgery. She was also sent to our clinic at the same time. The patient's husband is doing the dressings. He has been using silver alginate that  was supplied by home health. He is done a really good job the area on the left leg is actually healed only a small superficial area on the right Somewhere in the in the last 2 weeks she gives a bizarre history of going to wash nail polish off the fingers of both hands. She apparently did not realize that her son had put Clorox in the vinegar jar that they used to clean the countertop. She ended up burning her hands. The patient has a wound on the PIP of the left third finger, PIP of the right second finger and the lateral part of the right third finger Past medical history includes COPD, obstructive sleep apnea on nocturnal O2, type 2 diabetes with peripheral neuropathy, retinopathy, chronic diastolic heart failure coronary artery disease, severe diabetic neuropathy type 2 diabetes apparently diagnosed at age 42 on insulin, COPD, continued tobacco abuse, left leg deep DVT, hypertension, TIAs, seizure disorders, asthma The patient is actually had arterial studies in February of this year. ABIs bilaterally were 1.02 and TBI's bilaterally at 0.6. Waveforms were triphasic and biphasic. She was not felt to have significant arterial disease. 12/23; patient readmitted the clinic last week. She has predominantly venous insufficiency wounds on the right medial lower leg. She also had open areas on the fingers of both hands which happened in a very bizarre way. She has almost complete healing on the right medial calf. The area on the left third finger is healed. She still has 2 open wounds on the medial aspect of the PIP of the right second finger and the dorsal aspect of the right PIP of the third finger. Of these 2 wounds the PIP of the third finger wound is painful and deeper 12/30; the areas on her right medial calf are closed. She likely has some degree of chronic venous insufficiency and lymphedema. Also very xerotic skin which I  have recommended a moisturizing. I did not bring up the issue of  compression stockings The areas we are still looking out are on the PIP of the right third finger and the medial part of the PIP of the right second finger. The second finger area is just about closed. The problem is over the PIP of the right third finger. This does not look infected and she is completing her antibiotics from last week. However this is a deep wound at the base of this there is either tendon or periosteum. She she has some healthy granulation but we are not seeing that close over the bottom part of the wound. We are probably going to need to immobilize the finger somewhat so that she does not continuously pull the tissue apart 07/17/2019; right medial calf remains closed and the right second finger is closed. The sole problem here is an area over the PIP of the third finger. These wounds were initially advertised as burn injuries that happened in a very bizarre way [please see my initial discussion on this]. I gave her doxycycline last week because of some erythema around the wound however she did not tolerate this very well because of nausea. She stopped this within the last day or 2. we have been using silver collagen 1/13; the only wound we are looking at is the right third finger over the PIP. We are using endoform. X-ray ordered last time showed no underlying bone abnormalities 1/20; right third finger over the PIP. Still debris at the surface we have been using endoform. We are making some improvements in surface area and volume but still requiring debridement. 1/27; right third finger over the PIP. Still debris on the surface of the wound and maceration. We have been using endoform. This wound initially went to the periosteum and it certainly is off that but is certainly stalled over the last 2 or 3 weeks 2/10; right third finger over the PIP. We switch to Iodoflex last time but she says this caused extreme erythema and blistering and they changed back to endoform. This is a  wound that originally went to the periosteum. This is improved since her admission but really it is stalled. She missed her appointment last week but she states she has been using endoform. She complains of increasing pain in the finger that is making it difficult for her to sleep at times 2/17; right third finger over the PIP. This is a difficult wound small punched out area. Culture I did last week showed methicillin sensitive staph aureus however she is allergic to penicillin [widespread rash as a child] she has not had cephalosporins. Multiple drug interactions with quinolones and I was concerned about trimethoprim sulfamethoxazole in somebody with ACE inhibitors and spironolactone. I prescribed doxycycline. This was 2 days ago. She comes in today without the antibiotic saying that it caused mental status changes fatigue and diarrhea the last time she was on this. 2/24; right third finger over the PIP. She is tolerating the doxycycline has another 3 days left. This is for the area on the PIP of the third finger on her right. She arrives in clinic today with a thick raised eschar over the PIP of the left third finger. I talked to her about this. The exact reason is unclear. She does smoke but denies it could be a burn injury. Her husband thinks this may be trauma pushing her wheelchair but the depth of this makes this somewhat unusual. She does not appear to  have any blood flow issues in either arm Readmission: 06/29/2020 upon evaluation today patient appears to be doing somewhat poorly in regard to her left lower extremity. She has a couple open areas here which appear to be venous in nature. One of them is a more recent trauma but nonetheless both are very superficial. She does have a lot of lymphedema/stasis type weeping from the wound opening which I think is preventing her from healing. She has been trying to work on this LYDIAN, CHAVOUS ([id number]) on her own but unfortunately just is not  getting the results she needs as far as getting the swelling under control to allow these wounds to heal. Currently the patient tells me as well that she has been undergoing treatment for breast cancer on the right. I think she has a left mastectomy as well. She also has COPD. Her husband is actually taking very good care of her and doing what he can at this point in time. Patient does have hospice coming out to see her as well. Readmission: 02/02/21 second married issues that she was having with cellulitis. She tells me currently that she was in the hospital for several days and subsequently it was during that time that she developed a wound on her left heel. Initially this was thought to just be a blister and she was told not to pop it. Subsequently upon getting home she notes that this started to worsen an opening drain. That's when she decided to contact the wound center as we've seen her previously and taking care of ulcerations for her. Upon evaluation today the patient actually showed signs of what appears to be a pressure injury stage III to the left heel. There does not appear to be any significant depth although there's just one spot that goes down to the subcutaneous layer were there some Slough noted there is a lot of blistered skin and callous tissue that is going to need to be removed help this to heal properly. The patient voiced understanding. Otherwise her medical history per above really has not changed intricately at this point. The patient was started on Doxy cycling two days ago 02/08/2021 upon evaluation today patient's wound actually showing signs of excellent improvement. I do not see any evidence of infection which is great news and after the debridement this actually seems to have done great over the past week. I am extremely pleased with what I see today. 9/7; patient presents for follow-up. Its been over a month since patient was last seen. She has been using Xeroform to the  wound daily. She denies signs of infection. She has no issues or complaints today. 04/08/2021 upon evaluation today patient appears to be doing well currently in regard to her wound. She has been making great progress and overall very pleased with how the Xeroform is doing. I think she is significantly smaller and overall headed in the appropriate direction. 04/29/2021 upon evaluation today patient actually appears to be doing excellent at this time. She has been tolerating the dressing changes without complication and overall I think that we are definitely headed in the right direction in fact this appears to be completely healed though she still needs to be cautious with her heel I do not believe that she is getting need to wear any specific dressings any longer. She is very pleased to hear that just an ABD pad with some roll gauze will suffice using a stretch net to secure in place. Electronic Signature(s) Signed: 04/29/2021 5:39:23 PM  By: Worthy Keeler PA-C Entered By: Worthy Keeler on 04/29/2021 17:39:23 [name], [name] ([id number]) -------------------------------------------------------------------------------- Physical Exam Details Patient Name: [name] Date of Service: 04/29/2021 10:00 AM Medical Record Number: [id number] Patient Account Number: [id number] Date of Birth/Sex: 12/27/1970 (50 y.o. F) Treating RN: Dolan Amen Primary Care Provider: Alma Friendly Other Clinician: Referring Provider: Alma Friendly Treating Provider/Extender: Skipper Cliche in Treatment: 12 Constitutional Obese and well-hydrated in no acute distress. Respiratory normal breathing without difficulty. Psychiatric this patient is able to make decisions and demonstrates good insight into disease process. Alert and Oriented x 3. pleasant and cooperative. Notes Upon inspection patient's wound bed showed signs of complete epithelization and very pleased in this regard and overall I think that  the patient is making great progress. I do believe that based on what we are seeing we can discharge her from wound care services. Electronic Signature(s) Signed: 04/29/2021 5:39:47 PM By: Worthy Keeler PA-C Entered By: Worthy Keeler on 04/29/2021 17:39:47 [name], [name] ([id number]) -------------------------------------------------------------------------------- Physician Orders Details Patient Name: [name] Date of Service: 04/29/2021 10:00 AM Medical Record Number: [id number] Patient Account Number: [id number] Date of Birth/Sex: 21-Sep-1970 (50 y.o. 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 Moisturize legs daily. Electronic Signature(s) Signed: 04/29/2021 4:40:38 PM By: Dolan Amen RN Signed: 04/30/2021 4:28:58 PM By: Worthy Keeler PA-C Entered By: Dolan Amen on 04/29/2021 10:52:24 [name], [name] ([id number]) -------------------------------------------------------------------------------- Problem List Details Patient Name: [name] Date of Service: 04/29/2021 10:00 AM Medical Record Number: [id number] Patient Account Number: [id number] Date of Birth/Sex: 1971/01/07 (50 y.o. 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 F) Treating RN: Dolan Amen Primary Care Provider: Alma Friendly Other Clinician: Referring Provider: Alma Friendly Treating Provider/Extender: Skipper Cliche in Treatment: 12 Subjective Chief Complaint Information obtained from Patient Left Heel Ulcer History of Present Illness (HPI) ADMISSION 06/19/2019 Patient is a 50 year old woman who is accompanied by her husband. She has severe diabetic peripheral neuropathy which is left her minimally ambulatory. She spends most of the time in a wheelchair. She also has a history of chronic lower extremity edema history of left leg DVT. She states that on 2 separate occasions roughly a month ago she fell with lacerations  on the bilateral anterior tibial areas. She was seen by primary care on 06/04/2019 diagnosed with cellulitis of the left leg put on Bactrim DS. Also noted to have hand wounds with a question of referral to hand surgery. She was also sent to our clinic at the same time. The patient's husband is doing the  dressings. He has been using silver alginate that was supplied by home health. He is done a really good job the area on the left leg is actually healed only a small superficial area on the right Somewhere in the in the last 2 weeks she gives a bizarre history of going to wash nail polish off the fingers of both hands. She apparently did not realize that her son had put Clorox in the vinegar jar that they used to clean the countertop. She ended up burning her hands. The patient has a wound on the PIP of the left third finger, PIP of the right second finger and the lateral part of the right third finger Past medical history includes COPD, obstructive sleep apnea on nocturnal O2, type 2 diabetes with peripheral neuropathy, retinopathy, chronic diastolic heart failure coronary artery disease, severe diabetic neuropathy type 2 diabetes apparently diagnosed at age 59 on insulin, COPD, continued tobacco abuse, left leg deep DVT, hypertension, TIAs, seizure disorders, asthma The patient is actually had arterial studies in February of this year. ABIs bilaterally were 1.02 and TBI's bilaterally at 0.6. Waveforms were triphasic and biphasic. She was not felt to have significant arterial disease. 12/23; patient readmitted the clinic last week. She has predominantly venous insufficiency wounds on the right medial lower leg. She also had open areas on the fingers of both hands which happened in a very bizarre way. She has almost complete healing on the right medial calf. The area on the left third finger is healed. She still has 2 open wounds on the medial aspect of the PIP of the right second finger and the dorsal aspect of the right PIP of the third finger. Of these 2 wounds the PIP of the third finger wound is painful and deeper 12/30; the areas on her right medial calf are closed. She likely has some degree of chronic venous insufficiency and lymphedema. Also very xerotic skin which I have recommended a  moisturizing. I did not bring up the issue of compression stockings The areas we are still looking out are on the PIP of the right third finger and the medial part of the PIP of the right second finger. The second finger area is just about closed. The problem is over the PIP of the right third finger. This does not look infected and she is completing her antibiotics from last week. However this is a deep wound at the base of this there is either tendon or periosteum. She she has some healthy granulation but we are not seeing that close over the bottom part of the wound. We are probably going to need to immobilize the finger somewhat so that she does not continuously pull the tissue apart 07/17/2019; right medial calf remains closed and the right second finger is closed. The sole problem here is an area over the PIP of the third finger. These wounds were initially advertised as burn injuries that happened in a very bizarre way [please see my initial discussion on this]. I gave her doxycycline last week because of some erythema around the wound however she did not tolerate this very well because of nausea. She stopped this within  the last day or 2. we have been using silver collagen 1/13; the only wound we are looking at is the right third finger over the PIP. We are using endoform. X-ray ordered last time showed no underlying bone abnormalities 1/20; right third finger over the PIP. Still debris at the surface we have been using endoform. We are making some improvements in surface area and volume but still requiring debridement. 1/27; right third finger over the PIP. Still debris on the surface of the wound and maceration. We have been using endoform. This wound initially went to the periosteum and it certainly is off that but is certainly stalled over the last 2 or 3 weeks 2/10; right third finger over the PIP. We switch to Iodoflex last time but she says this caused extreme erythema and blistering and  they changed back to endoform. This is a wound that originally went to the periosteum. This is improved since her admission but really it is stalled. She missed her appointment last week but she states she has been using endoform. She complains of increasing pain in the finger that is making it difficult for her to sleep at times 2/17; right third finger over the PIP. This is a difficult wound small punched out area. Culture I did last week showed methicillin sensitive staph aureus however she is allergic to penicillin [widespread rash as a child] she has not had cephalosporins. Multiple drug interactions with quinolones and I was concerned about trimethoprim sulfamethoxazole in somebody with ACE inhibitors and spironolactone. I prescribed doxycycline. This was 2 days ago. She comes in today without the antibiotic saying that it caused mental status changes fatigue and diarrhea the last time she was on this. 2/24; right third finger over the PIP. She is tolerating the doxycycline has another 3 days left. This is for the area on the PIP of the third finger on her right. She arrives in clinic today with a thick raised eschar over the PIP of the left third finger. I talked to her about this. The exact reason is unclear. She does smoke but denies it could be a burn injury. Her husband thinks this may be trauma pushing her wheelchair but the depth of this makes this somewhat unusual. She does not appear to have any blood flow issues in either arm [name], [name] ([id number]) Readmission: 06/29/2020 upon evaluation today patient appears to be doing somewhat poorly in regard to her left lower extremity. She has a couple open areas here which appear to be venous in nature. One of them is a more recent trauma but nonetheless both are very superficial. She does have a lot of lymphedema/stasis type weeping from the wound opening which I think is preventing her from healing. She has been trying to work on  this on her own but unfortunately just is not getting the results she needs as far as getting the swelling under control to allow these wounds to heal. Currently the patient tells me as well that she has been undergoing treatment for breast cancer on the right. I think she has a left mastectomy as well. She also has COPD. Her husband is actually taking very good care of her and doing what he can at this point in time. Patient does have hospice coming out to see her as well. Readmission: 02/02/21 second married issues that she was having with cellulitis. She tells me currently that she was in the hospital for several days and subsequently it was during that time that she  developed a wound on her left heel. Initially this was thought to just be a blister and she was told not to pop it. Subsequently upon getting home she notes that this started to worsen an opening drain. That's when she decided to contact the wound center as we've seen her previously and taking care of ulcerations for her. Upon evaluation today the patient actually showed signs of what appears to be a pressure injury stage III to the left heel. There does not appear to be any significant depth although there's just one spot that goes down to the subcutaneous layer were there some Slough noted there is a lot of blistered skin and callous tissue that is going to need to be removed help this to heal properly. The patient voiced understanding. Otherwise her medical history per above really has not changed intricately at this point. The patient was started on Doxy cycling two days ago 02/08/2021 upon evaluation today patient's wound actually showing signs of excellent improvement. I do not see any evidence of infection which is great news and after the debridement this actually seems to have done great over the past week. I am extremely pleased with what I see today. 9/7; patient presents for follow-up. Its been over a month since patient was  last seen. She has been using Xeroform to the wound daily. She denies signs of infection. She has no issues or complaints today. 04/08/2021 upon evaluation today patient appears to be doing well currently in regard to her wound. She has been making great progress and overall very pleased with how the Xeroform is doing. I think she is significantly smaller and overall headed in the appropriate direction. 04/29/2021 upon evaluation today patient actually appears to be doing excellent at this time. She has been tolerating the dressing changes without complication and overall I think that we are definitely headed in the right direction in fact this appears to be completely healed though she still needs to be cautious with her heel I do not believe that she is getting need to wear any specific dressings any longer. She is very pleased to hear that just an ABD pad with some roll gauze will suffice using a stretch net to secure in place. Objective Constitutional Obese and well-hydrated in no acute distress. Vitals Time Taken: 10:29 AM, Height: 64 in, Weight: 242 lbs, BMI: 41.5, Temperature: 97.5 F, Pulse: 72 bpm, Respiratory Rate: 16 breaths/min, Blood Pressure: 139/85 mmHg. Respiratory normal breathing without difficulty. Psychiatric this patient is able to make decisions and demonstrates good insight into disease process. Alert and Oriented x 3. pleasant and cooperative. General Notes: Upon inspection patient's wound bed showed signs of complete epithelization and very pleased in this regard and overall I think that the patient is making great progress. I do believe that based on what we are seeing we can discharge her from wound care services. Integumentary (Hair, Skin) Wound #9 status is Healed - Epithelialized. Original cause of wound was Blister. The date acquired was: 01/13/2021. The wound has been in treatment 12 weeks. The wound is located on the Left Calcaneus. The wound measures 0cm length x  0cm width x 0cm depth; 0cm^2 area and 0cm^3 volume. There is no tunneling or undermining noted. There is a small amount of sanguinous drainage noted. There is no granulation within the wound bed. There is no necrotic tissue within the wound bed. 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 Services: Discharge from [place] Treatment Complete - Pad area daily for a week, always wear socks with your shoes. Moisturize legs daily. 1. Would recommend currently that we go ahead and continue with the protective dressing for another week if she has nothing that shows up on the ABD pad in the next week I think she can discontinue any dressing whatsoever just using a sock in her shoe. 2. Also can recommend that she contact me and let me know if she has any problems otherwise we will see her back for follow-up visit as needed. Electronic Signature(s) Signed: 04/29/2021 5:40:03 PM By: Worthy Keeler PA-C Entered By: Worthy Keeler on 04/29/2021 17:40:03 [name], [name] ([id number]) -------------------------------------------------------------------------------- SuperBill Details Patient Name: [name] Date of Service: 04/29/2021 Medical Record Number: [id number] Patient Account Number: [id number] Date of Birth/Sex: 1971/02/06 (50 y.o. F) Treating RN: Dolan Amen Primary Care Provider: Alma Friendly Other Clinician: Referring Provider: Alma Friendly Treating Provider/Extender: Skipper Cliche in Treatment: 12 Diagnosis Coding ICD-10 Codes Code Description [phone] Pressure ulcer of left heel, stage 3 L03.116 Cellulitis of left lower limb I87.2 Venous insufficiency (chronic) (peripheral) D05.81 Other specified  type of carcinoma in situ of right breast J44.9 Chronic obstructive pulmonary disease, unspecified Facility Procedures CPT4 Code: 44034742 Description: [id number] - WOUND CARE VISIT-LEV 2 EST PT Modifier: Quantity: 1 Physician Procedures CPT4 Code: [id number] Description: 29518 - WC PHYS LEVEL 3 - EST PT Modifier: Quantity: 1 CPT4 Code: Description: ICD-10 Diagnosis Description L89.623 Pressure ulcer of left heel, stage 3 L03.116 Cellulitis of left lower limb I87.2 Venous insufficiency (chronic) (peripheral) D05.81 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

## 2021-05-03 ENCOUNTER — Ambulatory Visit: Payer: BC Managed Care – PPO

## 2021-05-03 ENCOUNTER — Telehealth: Payer: Self-pay | Admitting: Primary Care

## 2021-05-03 DIAGNOSIS — M792 Neuralgia and neuritis, unspecified: Secondary | ICD-10-CM

## 2021-05-03 DIAGNOSIS — I5032 Chronic diastolic (congestive) heart failure: Secondary | ICD-10-CM

## 2021-05-03 DIAGNOSIS — L89629 Pressure ulcer of left heel, unspecified stage: Secondary | ICD-10-CM

## 2021-05-03 DIAGNOSIS — J449 Chronic obstructive pulmonary disease, unspecified: Secondary | ICD-10-CM

## 2021-05-03 DIAGNOSIS — G629 Polyneuropathy, unspecified: Secondary | ICD-10-CM

## 2021-05-03 DIAGNOSIS — E1042 Type 1 diabetes mellitus with diabetic polyneuropathy: Secondary | ICD-10-CM

## 2021-05-03 DIAGNOSIS — E119 Type 2 diabetes mellitus without complications: Secondary | ICD-10-CM

## 2021-05-03 DIAGNOSIS — I10 Essential (primary) hypertension: Secondary | ICD-10-CM

## 2021-05-03 NOTE — Patient Instructions (Signed)
Visit Information:  Thank you for taking the time to speak with me today.   PATIENT GOALS:  Goals Addressed             This Visit's Progress    Track and Manage Symptoms-Heart Failure/ High blood pressure   On track    Timeframe:  Long-Range Goal Priority:  High Start Date:   02/15/2021                          Expected End Date:   09/07/2020                   Follow Up Date 07/15/2021   - Continue to take Heart Failure Medications / all medications as prescribed and refill timely.  - Continue to weigh daily and record (notify MD with 2 lb weight gain over night or 5 lb in a week).  Contact Care connections as recommended.  - Follow heart failure Action Plan - Continue to adhere to low sodium diet - keep legs up while sitting - watch for swelling in feet, ankles and legs every day - continue to work with the Education officer, museum regarding counseling services.  - review smoking cessation strategies   Why is this important?   You will be able to handle your symptoms better if you keep track of them.  Making some simple changes to your lifestyle will help.  Eating healthy is one thing you can do to take good care of yourself.            Patient verbalizes understanding of instructions provided today and agrees to view in [place].   The patient has been provided with contact information for the care management team and has been advised to call with any health related questions or concerns.  The care management team will reach out to the patient again over the next 2-3 months.   Quinn Plowman RN,BSN,CCM RN Case Manager [place]  [phone]

## 2021-05-03 NOTE — Chronic Care Management (AMB) (Signed)
Chronic Care Management   CCM RN Visit Note  05/03/2021 Name: [name] MRN: [id number] DOB: July 28, 1970  Subjective: [name] is a 50 y.o. year old female who is a primary care patient of Pleas Koch, NP. The care management team was consulted for assistance with disease management and care coordination needs.    Engaged with patient by telephone for follow up visit in response to provider referral for case management and/or care coordination services.   Consent to Services:  The patient was given information about Chronic Care Management services, agreed to services, and gave verbal consent prior to initiation of services.  Please see initial visit note for detailed documentation.   Patient agreed to services and verbal consent obtained.   Assessment: Review of patient past medical history, allergies, medications, health status, including review of consultants reports, laboratory and other test data, was performed as part of comprehensive evaluation and provision of chronic care management services.   SDOH (Social Determinants of Health) assessments and interventions performed:    CCM Care Plan  Allergies  Allergen Reactions   Adhesive [Tape] Rash and Other (See Comments)    TAKES OFF THE SKIN (CERTAIN MEDICAL TAPES DO THIS!!)   Metoprolol Shortness Of Breath    Occurrence of shortness of breath after 3 days   Montelukast Shortness Of Breath   Morphine Sulfate Anaphylaxis, Shortness Of Breath and Nausea And Vomiting    Swollen Throat - Able to tolerate dilaudid   Penicillins Anaphylaxis, Hives and Shortness Of Breath    Throat swells Has patient had a PCN reaction causing immediate rash, facial/tongue/throat swelling, SOB or lightheadedness with hypotension: Yes Has patient had a PCN reaction causing severe rash involving mucus membranes or skin necrosis: No Has patient had a PCN reaction that required hospitalization: Yes Has patient had a PCN reaction occurring  within the last 10 years: No If all of the above answers are "NO", then may proceed with Cephalosporin use.    Diltiazem Swelling   Gabapentin Swelling   Midodrine     Lightheaded and falling down    Outpatient Encounter Medications as of 05/03/2021  Medication Sig Note   albuterol (VENTOLIN HFA) 108 (90 Base) MCG/ACT inhaler Inhale 2 puffs into the lungs every 6 (six) hours as needed for wheezing or shortness of breath.    ALPRAZolam (XANAX) 1 MG tablet Take 1 mg by mouth 4 (four) times daily as needed for anxiety.     anastrozole (ARIMIDEX) 1 MG tablet Take 1 tablet (1 mg total) by mouth daily.    aspirin EC 81 MG tablet Take 81 mg by mouth daily before breakfast.    blood glucose meter kit and supplies Use up to four times daily as directed. (FOR ICD-10 E10.9, E11.9).    budesonide-formoterol (SYMBICORT) 80-4.5 MCG/ACT inhaler INHALE 2 PUFFS BY MOUTH EVERY 12 HOURS TO PREVENT COUGH OR WHEEZING *RINSE MOUTH AFTER EACH USE*    busPIRone (BUSPAR) 30 MG tablet Take 30 mg by mouth 2 (two) times daily.    citalopram (CELEXA) 20 MG tablet Take 1 tablet (20 mg total) by mouth daily. 02/15/2021: Patient states she takes 2 per day.   desvenlafaxine (PRISTIQ) 100 MG 24 hr tablet Take 100 mg by mouth daily.    Desvenlafaxine ER 100 MG TB24 Take 1 tablet by mouth every morning.    diclofenac Sodium (VOLTAREN) 1 % GEL Apply 2 g topically 3 (three) times daily as needed. For pain.    Empagliflozin-metFORMIN HCl ER 25-1000 MG  TB24 Take 1 tablet by mouth daily with breakfast.    EYLEA 2 MG/0.05ML SOSY     FLUoxetine (PROZAC) 40 MG capsule Take 40 mg by mouth daily. (Patient not taking: No sig reported)    glucose blood test strip Use as instructed    Insulin Pen Needle 32G X 4 MM MISC Used to give insulin injections twice daily.    insulin regular human CONCENTRATED (HUMULIN R) 500 UNIT/ML injection Inject 30 Units into the skin 3 (three) times daily with meals. 30 U BID    Insulin Syringe-Needle U-100  (GLOBAL INJECT EASE INSULIN SYR) 30G X 1/2" 1 ML MISC See admin instructions.    Lido-Capsaicin-Men-Methyl Sal 0.5-0.035-5-20 % PTCH Apply 1 patch to the skin every 8 hours as needed for pain. Rotate sites of patch.    mupirocin ointment (BACTROBAN) 2 % Apply 1 application topically 2 (two) times daily. Apply to the affected area 2 times a day    OXYGEN Inhale 2-4 L into the lungs 2 (two) times daily as needed (shortness of breath).     pregabalin (LYRICA) 300 MG capsule Take 1 capsule (300 mg total) by mouth 2 (two) times daily. For nerve pain.    ReliOn Ultra Thin Lancets 30G MISC Use as directed for blood sugar checks.    rOPINIRole (REQUIP) 1 MG tablet TAKE 1 TABLET BY MOUTH ONCE DAILY AT BEDTIME FOR  RESTLESS  LEGS    rosuvastatin (CRESTOR) 20 MG tablet Take 1 tablet (20 mg total) by mouth daily. For cholesterol.    spironolactone (ALDACTONE) 50 MG tablet Take 50 mg by mouth daily.    topiramate (TOPAMAX) 50 MG tablet Take 1 tablet (50 mg total) by mouth 2 (two) times daily. For headache prevention.    traZODone (DESYREL) 100 MG tablet Take 200 mg by mouth at bedtime.    ULTICARE MINI PEN NEEDLES 31G X 6 MM MISC USE AS DIRECTED 3 TIMES DAILY    XIGDUO XR 5-500 MG TB24 Take 1 tablet by mouth every morning.    No facility-administered encounter medications on file as of 05/03/2021.    Patient Active Problem List   Diagnosis Date Noted   Headache disorder 03/08/2021   Dysuria 03/05/2021   Pressure sore 01/29/2021   Hyperlipidemia associated with type 2 diabetes mellitus ([place]) 01/13/2021   Fatigue 12/01/2020   Acute on chronic diastolic heart failure ([place]) 11/16/2020   Chronic migraine without aura without status migrainosus, not intractable 10/01/2020   Dizziness 10/01/2020   Microscopic hematuria 07/01/2020   Pulmonary nodules 07/01/2020   Hav (hallux abducto valgus), unspecified laterality 06/11/2020   Venous ulcer of ankle, left ([place]) 05/15/2020   Contracture of hand joint  05/15/2020   Acute kidney injury superimposed on CKD ([place])    Hyperlipidemia    Carcinoma of lower-outer quadrant of right breast in female, estrogen receptor positive ([place]) 11/12/2019   Restrictive lung disease 10/08/2019   Postmenopausal bleeding 08/30/2019   Carotid stenosis, asymptomatic, right 08/16/2019   Nausea and vomiting 07/15/2019   Cellulitis of left lower extremity 06/04/2019   Multiple wounds of skin 06/04/2019   Other injury of unspecified body region, initial encounter 06/04/2019   (HFpEF) heart failure with preserved ejection fraction ([place]) 11/16/2018   Left-sided weakness 09/13/2018   Weakness of left lower extremity 09/05/2018   Recurrent falls 09/05/2018   PAD (peripheral artery disease) ([place]) 08/27/2018   Chronic congestive heart failure ([place]) 08/02/2018   Vaginal itching 06/15/2018   Chronic upper extremity pain (Secondary  Area of Pain) (Bilateral) 04/23/2018   Diabetic polyneuropathy associated with type 2 diabetes mellitus ([place]) 04/23/2018   Long term prescription benzodiazepine use 04/23/2018   Neurogenic pain 04/23/2018   Vitamin D insufficiency 01/29/2018   Insulin dependent type 2 diabetes mellitus ([place]) 01/23/2018   DNR (do not resuscitate) 01/23/2018   CKD stage 3 due to type 2 diabetes mellitus ([place]) 01/23/2018   IBS (irritable bowel syndrome) 01/19/2018   Chronic lower extremity pain (Primary Area of Pain) (Bilateral) 01/08/2018   Chronic low back pain [facility] Area of Pain) (Bilateral) w/ sciatica (Bilateral) 01/08/2018   Chronic pain syndrome 01/08/2018   Pharmacologic therapy 01/08/2018   Disorder of skeletal system 01/08/2018   Problems influencing health status 01/08/2018   Long term current use of opiate analgesic 01/08/2018   Restless leg syndrome 08/02/2017   Nocturnal hypoxemia 03/29/2017   Vision disturbance 02/28/2017   CKD (chronic kidney disease), stage II 02/28/2017   Visual loss, bilateral    Chronic respiratory failure with  hypoxia (HCC)/ nocturnal 02 dep  10/28/2016   Upper airway cough syndrome 08/22/2016   Cigarette smoker 06/15/2016   Simple chronic bronchitis (HCC)    Coronary artery disease involving native heart without angina pectoris 05/16/2016   Chronic diastolic CHF (congestive heart failure) ([place]) 11/04/2015   Orthopnea 11/03/2015   Bilateral leg edema    Diabetes ([place])    COPD exacerbation ([place]) 10/15/2015   Fracture of rib, closed 10/15/2015   Venous stasis of both lower extremities 03/29/2015   Preventative health care 02/04/2015   Hypertension associated with diabetes ([place]) 01/02/2015   Inguinal hernia 11/27/2014   Allergic rhinitis with postnasal drip 01/21/2014   Aortic valve disorders 04/18/2013   Sleep apnea 05/22/2012   Syncope and collapse 10/09/2011   Seizure disorder ([place]) 10/09/2011   Bipolar disorder ([place]) 10/09/2011   TIA (transient ischemic attack) 06/22/2011   Constipation 06/15/2011   HYPERTRIGLYCERIDEMIA 07/17/2007   Situational anxiety 05/30/2007   COPD with chronic bronchitis ([place]) 05/30/2007   Morbid (severe) obesity due to excess calories ([place]) 04/23/2007   Tobacco abuse 09/07/2006    Conditions to be addressed/monitored:CHF and HTN  Care Plan : Cardiovascular  Updates made by Dannielle Karvonen, RN since 05/03/2021 12:00 AM     Problem: Symptom exacerbation ( heart failure/ Hypertension)   Priority: High     Long-Range Goal: Symptom Exacerbation Prevented or Minimized   Start Date: 02/15/2021  Expected End Date: 09/07/2021  Recent Progress: On track  Priority: High  Note:   Current Barriers:  Knowledge deficits related to long term care for self management of Heart failure/ hypertension:  Patient denies any heart failure symptoms.  Reports today's weight 225 lbs. Patient states recent blood pressure readings:  139/87, 120/80.  Patient confirms she was contacted by Education officer, museum and arrangements are being made to refer for counseling services. Patient reports  having recent ED visit on 04/14/2022 due to a fall. She reports falling on her ramp ( no railings and sustaining several fractured ribs. Patient states she has since had ramp taken down and will use the front entrance to her home that has ramp with rails.  Patient reports her home physical therapy is on hold until November due to her fall injury. Patient states her follow up at the wound clinic has been discontinued as of 04/29/2021.  Unable to perform ADLs independently /Unable to perform IADLs independently:  Patient states she receives supportive care from husband and son.   Nurse Case Manager Clinical  Goal(s):  patient will weigh self daily and record patient will verbalize understanding of Heart Failure Action Plan and when to call doctor patient will take Heart Failure and all other medications as prescribed Interventions:  Collaboration with Pleas Koch, NP regarding development and update of comprehensive plan of care as evidenced by provider attestation and co-signature Inter-disciplinary care team collaboration (see longitudinal plan of care) Basic overview and discussion of pathophysiology of Heart Failure Provided written and verbal education on low sodium diet Reviewed Heart Failure Action Plan in depth and provided written copy Assessed for scales in home Discussed importance of daily weight Reviewed role of diuretics in prevention of fluid overload Discussed smoking cessation.  Patient Goals:  - Continue to take Heart Failure Medications / all medications as prescribed and refill timely.  - Continue to weigh daily and record (notify MD with 2 lb weight gain over night or 5 lb in a week).  Contact Care connections as recommended.  - Follow heart failure Action Plan - Continue to adhere to low sodium diet - keep legs up while sitting - watch for swelling in feet, ankles and legs every day - continue to work with the Education officer, museum regarding counseling services.  - review  smoking cessation strategies Follow Up Plan: The patient has been provided with contact information for the care management team and has been advised to call with any health related questions or concerns.  The care management team will reach out to the patient again over the next 2-3 months.           Plan:The patient has been provided with contact information for the care management team and has been advised to call with any health related questions or concerns.  The care management team will reach out to the patient again over the next 2-3 months. Quinn Plowman RN,BSN,CCM RN Case Manager [place]  [phone]

## 2021-05-03 NOTE — Telephone Encounter (Signed)
  Encourage patient to contact the pharmacy for refills or they can request refills through [place]:  Please schedule appointment if longer than 1 year  NEXT APPOINTMENT DATE:05/03/21  MEDICATION:pregabalin (LYRICA) 300 MG capsule  Is the patient out of medication? 3 pills left  [place], [address]  Let patient know to contact pharmacy at the end of the day to make sure medication is ready.  Please notify patient to allow 48-72 hours to process  CLINICAL FILLS OUT ALL BELOW:   LAST REFILL:  QTY:  REFILL DATE:    OTHER COMMENTS:    Okay for refill?  Please advise

## 2021-05-04 MED ORDER — PREGABALIN 300 MG PO CAPS: 300.0000 mg | ORAL_CAPSULE | Freq: Two times a day (BID) | ORAL | 0 refills | Status: DC

## 2021-05-04 NOTE — Telephone Encounter (Signed)
Looks like the last time Lyrica was filled was at [facility]. Will send refill over to [facility] as requested.

## 2021-05-04 NOTE — Addendum Note (Signed)
Addended by: Pleas Koch on: 05/04/2021 07:34 AM   Modules accepted: Orders

## 2021-05-06 DIAGNOSIS — G40909 Epilepsy, unspecified, not intractable, without status epilepticus: Secondary | ICD-10-CM | POA: Diagnosis not present

## 2021-05-06 DIAGNOSIS — E1151 Type 2 diabetes mellitus with diabetic peripheral angiopathy without gangrene: Secondary | ICD-10-CM | POA: Diagnosis not present

## 2021-05-06 DIAGNOSIS — E1122 Type 2 diabetes mellitus with diabetic chronic kidney disease: Secondary | ICD-10-CM | POA: Diagnosis not present

## 2021-05-06 DIAGNOSIS — E1165 Type 2 diabetes mellitus with hyperglycemia: Secondary | ICD-10-CM | POA: Diagnosis not present

## 2021-05-06 DIAGNOSIS — I2510 Atherosclerotic heart disease of native coronary artery without angina pectoris: Secondary | ICD-10-CM | POA: Diagnosis not present

## 2021-05-06 DIAGNOSIS — N182 Chronic kidney disease, stage 2 (mild): Secondary | ICD-10-CM | POA: Diagnosis not present

## 2021-05-06 DIAGNOSIS — E1142 Type 2 diabetes mellitus with diabetic polyneuropathy: Secondary | ICD-10-CM | POA: Diagnosis not present

## 2021-05-06 DIAGNOSIS — I69334 Monoplegia of upper limb following cerebral infarction affecting left non-dominant side: Secondary | ICD-10-CM | POA: Diagnosis not present

## 2021-05-06 DIAGNOSIS — L89622 Pressure ulcer of left heel, stage 2: Secondary | ICD-10-CM | POA: Diagnosis not present

## 2021-05-06 DIAGNOSIS — C50511 Malignant neoplasm of lower-outer quadrant of right female breast: Secondary | ICD-10-CM | POA: Diagnosis not present

## 2021-05-06 DIAGNOSIS — E11622 Type 2 diabetes mellitus with other skin ulcer: Secondary | ICD-10-CM | POA: Diagnosis not present

## 2021-05-06 DIAGNOSIS — I5032 Chronic diastolic (congestive) heart failure: Secondary | ICD-10-CM | POA: Diagnosis not present

## 2021-05-06 DIAGNOSIS — J449 Chronic obstructive pulmonary disease, unspecified: Secondary | ICD-10-CM | POA: Diagnosis not present

## 2021-05-06 DIAGNOSIS — I130 Hypertensive heart and chronic kidney disease with heart failure and stage 1 through stage 4 chronic kidney disease, or unspecified chronic kidney disease: Secondary | ICD-10-CM | POA: Diagnosis not present

## 2021-05-06 DIAGNOSIS — F319 Bipolar disorder, unspecified: Secondary | ICD-10-CM | POA: Diagnosis not present

## 2021-05-06 DIAGNOSIS — R519 Headache, unspecified: Secondary | ICD-10-CM | POA: Diagnosis not present

## 2021-05-06 IMAGING — CT CT HEAD W/O CM
3 of 4 series · 14 of 47 positions shown, 16 images · non-contrast
Comparison: CT head 05/15/2020

CLINICAL DATA: Chronic headaches. Breast cancer. Left-sided
weakness.

EXAM:
CT HEAD WITHOUT CONTRAST
TECHNIQUE: Contiguous axial images were obtained from the base of the skull
through the vertex without intravenous contrast.

[Series 2: axial st head 5.00 ax · axial · 0.34mm/px · z∈[-496,-379]mm · 8 of 31 slices shown, 10 images]
[im 3/31  brain]
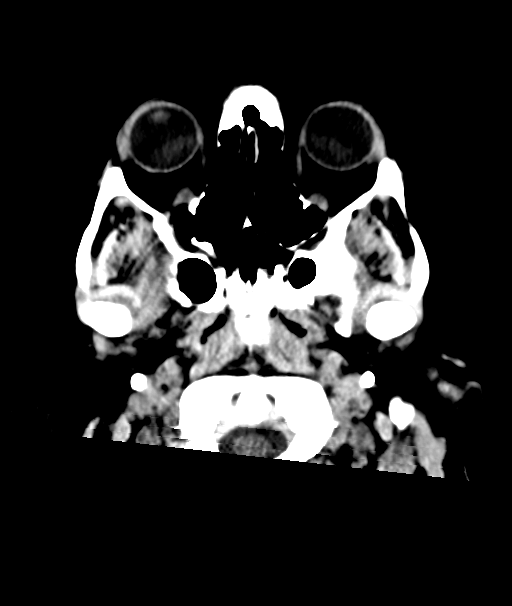
[im 3/31  bone]
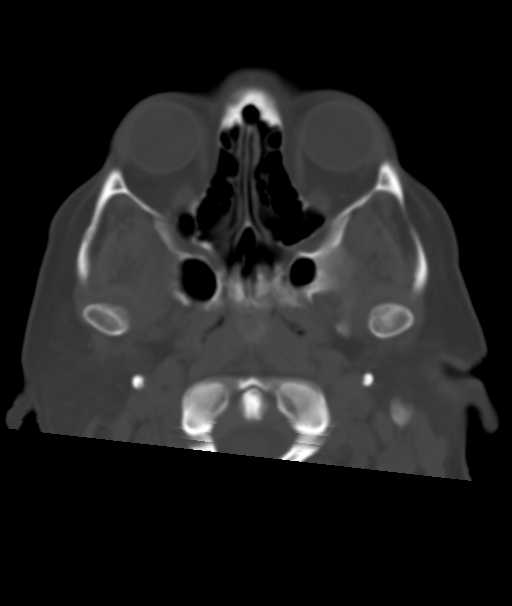
[im 7/31  brain]
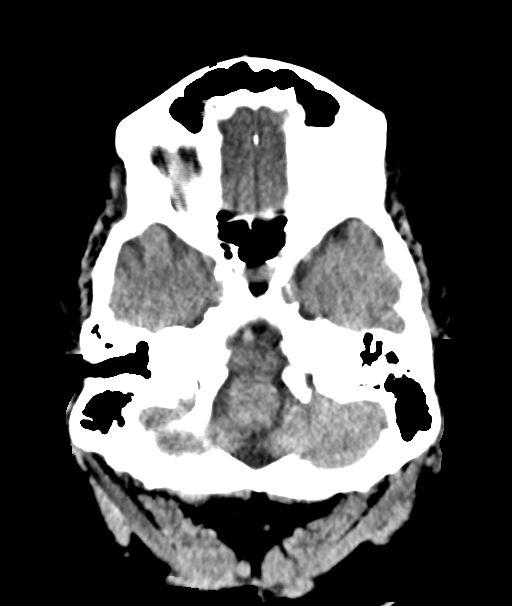
[im 11/31  brain]
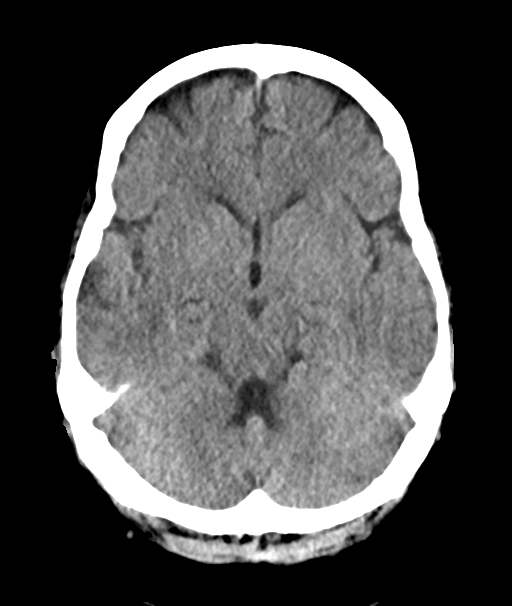
[im 13/31  brain]
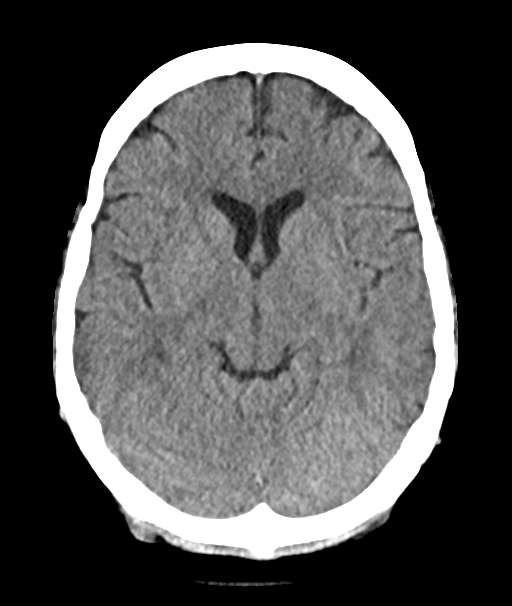
[im 18/31  brain]
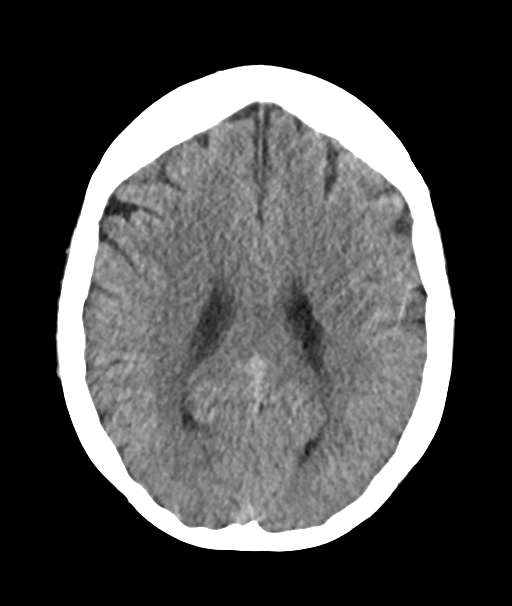
[im 18/31  bone]
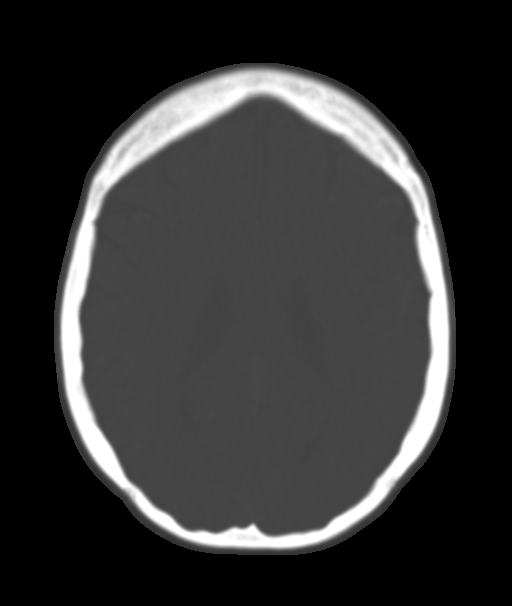
[im 20/31  brain]
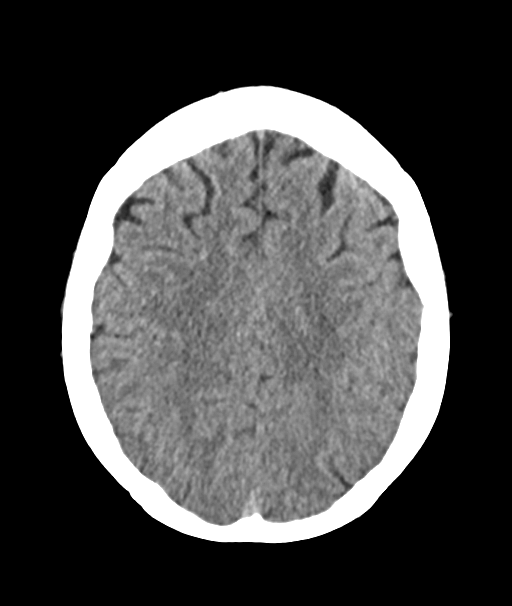
[im 24/31  brain]
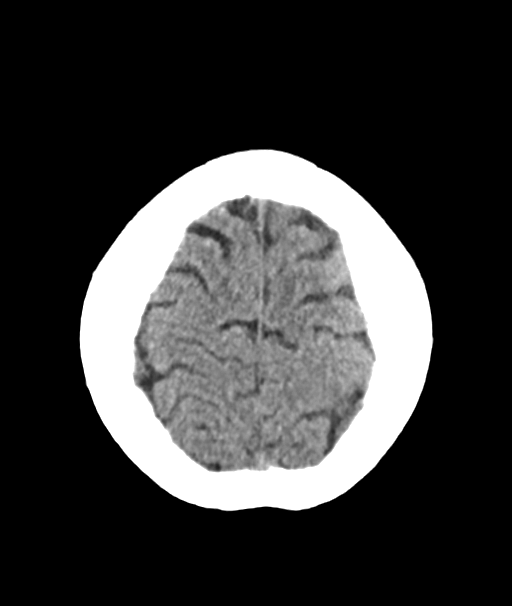
[im 28/31  brain]
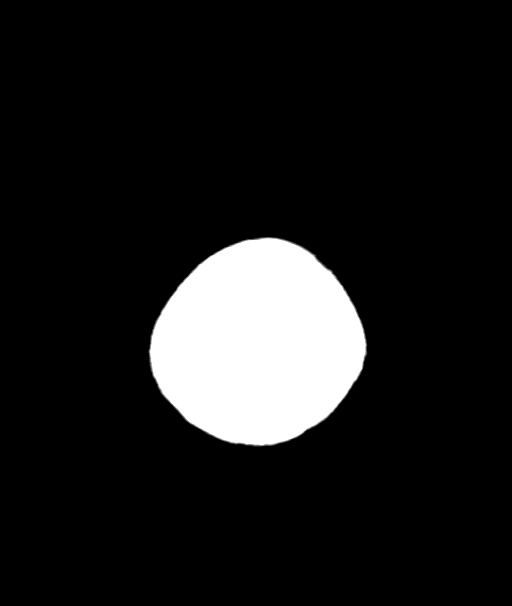

[Series 6: coronals head 3.00 cor · coronal · 0.32mm/px · 3 of 68 slices shown]
[im 23/68  brain]
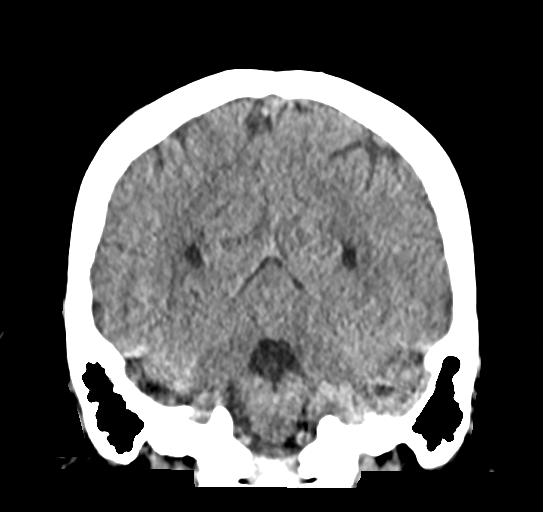
[im 30/68  brain]
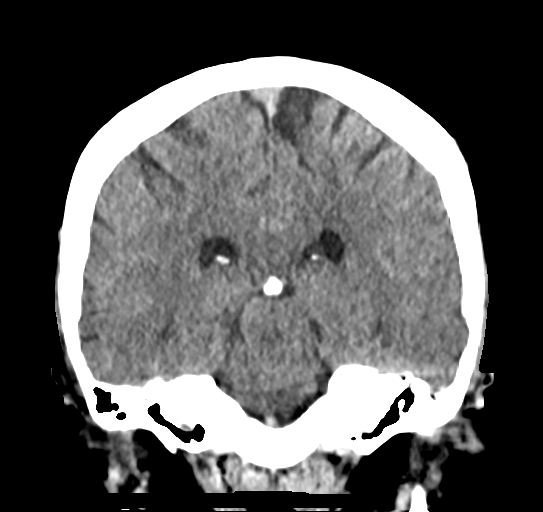
[im 38/68  brain]
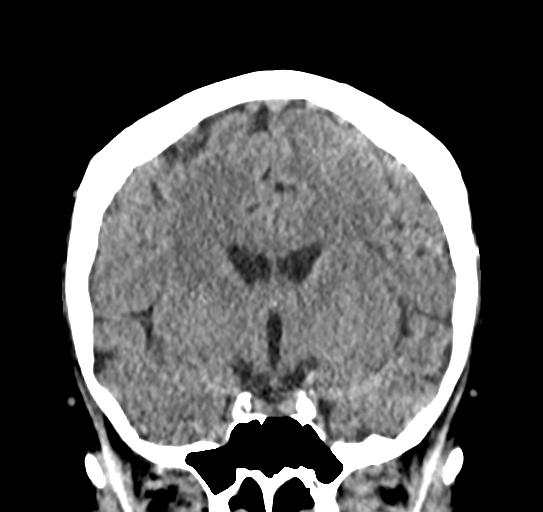

[Series 8: sagittals head 3.00 sag · sagittal · 0.31mm/px · 3 of 57 slices shown]
[im 19/57  brain]
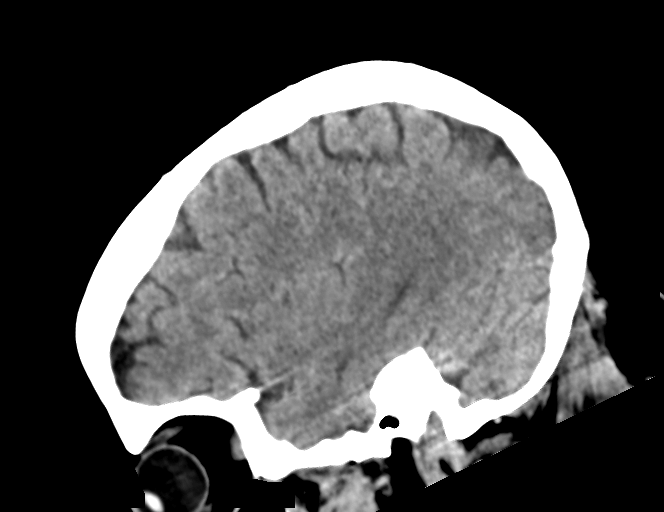
[im 29/57  brain]
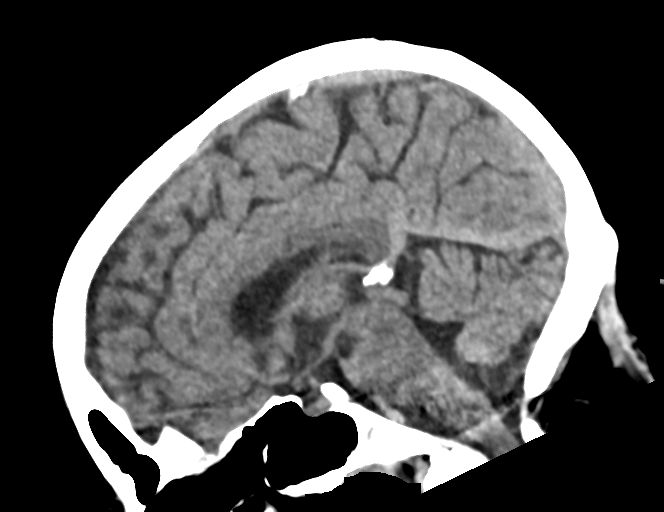
[im 38/57  brain]
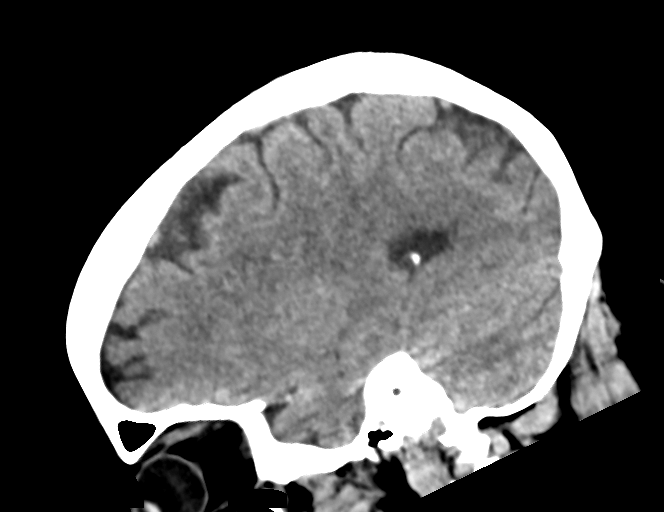

[14 of 47 positions shown; findings below may reference images not displayed]

FINDINGS: Brain: The brainstem, cerebellum, cerebral peduncles, thalami, basal
ganglia, basilar cisterns, and ventricular system appear within
normal limits. No intracranial hemorrhage, mass lesion, or acute
CVA.

Vascular: Unremarkable

Skull: Unremarkable

Sinuses/Orbits: Unremarkable

Other: 0.4 cm BB or similar spherical metal foreign body lodged in
the left frontoparietal scalp.
IMPRESSION: 1. No significant intracranial abnormality.
2. 0.4 cm BB or similar spherical metal foreign body lodged in the
left frontoparietal scalp.

## 2021-05-10 DIAGNOSIS — J449 Chronic obstructive pulmonary disease, unspecified: Secondary | ICD-10-CM | POA: Diagnosis not present

## 2021-05-10 DIAGNOSIS — E119 Type 2 diabetes mellitus without complications: Secondary | ICD-10-CM

## 2021-05-10 DIAGNOSIS — F3131 Bipolar disorder, current episode depressed, mild: Secondary | ICD-10-CM

## 2021-05-10 DIAGNOSIS — I10 Essential (primary) hypertension: Secondary | ICD-10-CM

## 2021-05-10 DIAGNOSIS — I5032 Chronic diastolic (congestive) heart failure: Secondary | ICD-10-CM

## 2021-05-10 DIAGNOSIS — E1165 Type 2 diabetes mellitus with hyperglycemia: Secondary | ICD-10-CM | POA: Diagnosis not present

## 2021-05-12 ENCOUNTER — Inpatient Hospital Stay: Payer: BC Managed Care – PPO

## 2021-05-12 ENCOUNTER — Other Ambulatory Visit: Payer: Self-pay

## 2021-05-12 ENCOUNTER — Inpatient Hospital Stay (HOSPITAL_BASED_OUTPATIENT_CLINIC_OR_DEPARTMENT_OTHER): Payer: BC Managed Care – PPO | Admitting: Internal Medicine

## 2021-05-12 ENCOUNTER — Inpatient Hospital Stay: Payer: BC Managed Care – PPO | Attending: Internal Medicine

## 2021-05-12 DIAGNOSIS — Z170 Estrogen receptor positive status [ER+]: Secondary | ICD-10-CM

## 2021-05-12 DIAGNOSIS — I130 Hypertensive heart and chronic kidney disease with heart failure and stage 1 through stage 4 chronic kidney disease, or unspecified chronic kidney disease: Secondary | ICD-10-CM | POA: Diagnosis not present

## 2021-05-12 DIAGNOSIS — N182 Chronic kidney disease, stage 2 (mild): Secondary | ICD-10-CM | POA: Diagnosis not present

## 2021-05-12 DIAGNOSIS — C50511 Malignant neoplasm of lower-outer quadrant of right female breast: Secondary | ICD-10-CM | POA: Diagnosis not present

## 2021-05-12 DIAGNOSIS — F419 Anxiety disorder, unspecified: Secondary | ICD-10-CM | POA: Insufficient documentation

## 2021-05-12 DIAGNOSIS — E1122 Type 2 diabetes mellitus with diabetic chronic kidney disease: Secondary | ICD-10-CM | POA: Insufficient documentation

## 2021-05-12 DIAGNOSIS — J440 Chronic obstructive pulmonary disease with acute lower respiratory infection: Secondary | ICD-10-CM | POA: Insufficient documentation

## 2021-05-12 DIAGNOSIS — F319 Bipolar disorder, unspecified: Secondary | ICD-10-CM | POA: Insufficient documentation

## 2021-05-12 DIAGNOSIS — Z79811 Long term (current) use of aromatase inhibitors: Secondary | ICD-10-CM | POA: Insufficient documentation

## 2021-05-12 DIAGNOSIS — E1142 Type 2 diabetes mellitus with diabetic polyneuropathy: Secondary | ICD-10-CM | POA: Diagnosis not present

## 2021-05-12 LAB — CBC WITH DIFFERENTIAL/PLATELET
Abs Immature Granulocytes: 0.06 10*3/uL (ref 0.00–0.07)
Basophils Absolute: 0.1 10*3/uL (ref 0.0–0.1)
Basophils Relative: 1 %
Eosinophils Absolute: 0.4 10*3/uL (ref 0.0–0.5)
Eosinophils Relative: 5 %
HCT: 40 % (ref 36.0–46.0)
Hemoglobin: 12.8 g/dL (ref 12.0–15.0)
Immature Granulocytes: 1 %
Lymphocytes Relative: 13 %
Lymphs Abs: 1.1 10*3/uL (ref 0.7–4.0)
MCH: 27.9 pg (ref 26.0–34.0)
MCHC: 32 g/dL (ref 30.0–36.0)
MCV: 87.3 fL (ref 80.0–100.0)
Monocytes Absolute: 0.6 10*3/uL (ref 0.1–1.0)
Monocytes Relative: 7 %
Neutro Abs: 5.9 10*3/uL (ref 1.7–7.7)
Neutrophils Relative %: 73 %
Platelets: 195 10*3/uL (ref 150–400)
RBC: 4.58 MIL/uL (ref 3.87–5.11)
RDW: 17 % — ABNORMAL HIGH (ref 11.5–15.5)
WBC: 8.1 10*3/uL (ref 4.0–10.5)
nRBC: 0 % (ref 0.0–0.2)

## 2021-05-12 LAB — COMPREHENSIVE METABOLIC PANEL
ALT: 18 U/L (ref 0–44)
AST: 14 U/L — ABNORMAL LOW (ref 15–41)
Albumin: 3.8 g/dL (ref 3.5–5.0)
Alkaline Phosphatase: 191 U/L — ABNORMAL HIGH (ref 38–126)
Anion gap: 10 (ref 5–15)
BUN: 25 mg/dL — ABNORMAL HIGH (ref 6–20)
CO2: 27 mmol/L (ref 22–32)
Calcium: 9.1 mg/dL (ref 8.9–10.3)
Chloride: 98 mmol/L (ref 98–111)
Creatinine, Ser: 1.04 mg/dL — ABNORMAL HIGH (ref 0.44–1.00)
GFR, Estimated: >60 mL/min (ref >60)
Glucose, Bld: 317 mg/dL — ABNORMAL HIGH (ref 70–99)
Potassium: 4.6 mmol/L (ref 3.5–5.1)
Sodium: 135 mmol/L (ref 135–145)
Total Bilirubin: 0.8 mg/dL (ref 0.3–1.2)
Total Protein: 8.6 g/dL — ABNORMAL HIGH (ref 6.5–8.1)

## 2021-05-12 MED ORDER — LEUPROLIDE ACETATE (3 MONTH) 11.25 MG IM KIT
11.2500 mg | PACK | Freq: Once | INTRAMUSCULAR | Status: AC
  Administered 2021-05-12: 11.25 mg via INTRAMUSCULAR
  Filled 2021-05-12: qty 11.25

## 2021-05-12 NOTE — Progress Notes (Signed)
Patient states that she fell in October and hurt her ribs, then she fell two days ago and hurt her ribs in the same spot, made it worse. (Left side)

## 2021-05-12 NOTE — Assessment & Plan Note (Addendum)
#   T2N0- ER/PR-positive; premenopausal currently on Anastrazole [stopped tam sec to poor tolerance].  Continue  EligardDepo 11.25 mg every 3 months. CT AUG, 29th 2022- CT C/A/P- NED.   #Continue anastrozole + Eligard for now.  Tolerating fairly well.  Eligard- today  # Headache/migraines-awaiting awaiting neurology evaluation do not suspect for malignancy; discussed with PCP. STABLE.    #Depression/anxiety/bipolar-currently on BuSpar/Celexa/trazodone As per psychiatry- STABLE.-  #Chronic respiratory failure-CHF/COPD; STABLE; SEP 2022-CT scan [bronchiolitis from smoking]; discussed at length regarding quitting smoking/ states cutting down.   #Poorly controlled diabetes-on insulin; [facility] AM- 317- [Dr.O'Connell endocrinology]-  Again stressed the importance of glucose control-STABLE; congratulated the patient on weight loss.  Continue-dietary modification/increasing activity.  #Peripheral neuropathy grade 2-3; patient goes on with a walker/cane at baseline- STABLE.   #Genetics: mom- breast cancer; older brother at 70s- brain tumor; dad- brain tumor. # Genetics: Discussed with the patient majority of breast cancers are sporadic however 10 to 20% at risk of genetic/hereditary cancer syndromes.  Discussed importance of genetic counseling/genetic testing.  Patient interested in genetic counseling.  We will make a referral.   DISPOSITION:  # Eligard today # refer to genetics re; breast cancer /coordinate appointment with next  office visit.  # follow up in 3 month- MD; labs- cbc/cmp/ca-27-29; Eligard-Dr.B

## 2021-05-12 NOTE — Progress Notes (Signed)
[place] NOTE  Patient Care Team: Pleas Koch, NP as PCP - General (Internal Medicine) Rosamaria Lints, MD (Inactive) as Referring Physician (Neurology) Alvester Chou, NP (Nurse Practitioner) Renato Shin, MD as Consulting Physician (Endocrinology) Theodore Demark, RN as Oncology Nurse Navigator Ronny Bacon, MD as Consulting Physician (General Surgery) Ferriday, Emery as Registered Nurse Flora Lipps, MD as Consulting Physician (Pulmonary Disease) Sherlynn Stalls, MD as Consulting Physician (Ophthalmology) Dannielle Karvonen, RN as [place] Management Marcelline Deist, Ranae Pila, LCSW as Social Worker (Licensed Clinical Social Worker)  [place]: Breast cancer.  #  Oncology History Overview Note  #April 2021-2.5 cm right breast mass [incidental-CT scan chest]  # April 2021- RIGHT BREAST [facility]; s/p ER- 51-90%; PR- 51-90%; Her- 2-NEG. G-2. [Dr.Rodenberg]; s/p  Masectomy- pT2 pN0 (sn); Tamoxifen; Poor candidate for chemo;   # Discontinue tamoxifen early DEC 2021 [sec to nausea/vomiting diarrhea]  #Late December 2021- Anastrazole + Lupron 11.25 every 3 months  # DM- poorly controlled; Seizure disorder/Bipolar/TIA ; Diabetes PN- G-3 [cane/walker; falls]; COPD; OSA active smoker  # SURVIVORSHIP:   # GENETICS:   DIAGNOSIS: Right breast cancer  STAGE:    II     ;  GOALS: goal  CURRENT/MOST RECENT THERAPY : Anastrazole + Lupron 11.25 every 3 months     Carcinoma of lower-outer quadrant of right breast in female, estrogen receptor positive ([place])  11/12/2019 Initial Diagnosis   Carcinoma of lower-outer quadrant of right breast in female, estrogen receptor positive ([place])    HISTORY OF PRESENTING ILLNESS: Patient in independently.  Accompanied by husband.  Marijose Lalanne 50 y.o.  female premenopausal patient with right-sided stage II ER/PR positive HER-2 negative breast cancer on adjuvant anastrozole  +Lupron with multiple comorbidities-including poorly controlled diabetes/bipolar disorder/active smoker/neuropathy is here for follow-up.  Patient has been trying to lose weight.  She lost about 15 pounds in the last 1 month.  Patient continues to have elevated blood glucose;-evaluated by endocrinology.  Otherwise no fever no chills.  No nausea no vomiting.  Currently getting wig-because of continued hair loss.  Review of Systems  Constitutional:  Positive for malaise/fatigue. Negative for chills, diaphoresis, fever and weight loss.  HENT:  Negative for nosebleeds and sore throat.   Eyes:  Negative for double vision.  Respiratory:  Negative for cough, hemoptysis, sputum production, shortness of breath and wheezing.   Cardiovascular:  Negative for chest pain, palpitations, orthopnea and leg swelling.  Gastrointestinal:  Negative for abdominal pain, blood in stool, constipation, diarrhea, heartburn, melena, nausea and vomiting.  Genitourinary:  Negative for dysuria, frequency and urgency.  Musculoskeletal:  Positive for back pain, joint pain and myalgias.  Skin:  Negative for itching.  Neurological:  Positive for tingling. Negative for dizziness, focal weakness, weakness and headaches.  Endo/Heme/Allergies:  Does not bruise/bleed easily.  Psychiatric/Behavioral:  Negative for depression. The patient is not nervous/anxious and does not have insomnia.     MEDICAL HISTORY:  Past Medical History:  Diagnosis Date  . Anxiety    takes Prozac daily  . Anxiety   . Aortic valve calcification   . Asthma    Advair and Spirva daily  . Asthma   . Bipolar disorder ([place])   . Breast cancer, right breast ([place]) 12/23/2019  . CAD (coronary artery disease)    a. LHC 11/2013 done for CP/fluid retention: mild disease in prox LAD, mild-mod disease in mRCA, EF 60% with normal LVEDP. b. Normal nuc 03/2016.  Marland Kitchen  Cancer ([place])   . Cellulitis and abscess of trunk 03/12/2020  . CHF (congestive heart failure) ([place])    . Chronic diastolic CHF (congestive heart failure) ([place])   . Chronic heart failure with preserved ejection fraction ([place]) 11/16/2018  . CKD (chronic kidney disease), stage II   . COPD (chronic obstructive pulmonary disease) (H[facility])    a. nocturnal O2.  Marland Kitchen COPD (chronic obstructive pulmonary disease) ([place])   . Coronary artery disease   . Decreased urine stream   . Diabetes mellitus   . Diabetes mellitus without complication ([place])   . Dyspnea   . Elevated C-reactive protein (CRP) 01/29/2018  . Elevated sedimentation rate 01/29/2018  . Elevated serum hCG 01/19/2018  . Family history of adverse reaction to anesthesia    mom gets nauseated  . Foot pain, bilateral 05/22/2018  . GERD (gastroesophageal reflux disease)    takes Pepcid daily  . History of blood clots    left leg 3-46yr ago  . Hyperlipidemia   . Hypertension   . Hypertriglyceridemia   . Inguinal hernia, left 01/2015  . Muscle spasm   . Open wound of genital labia   . Peripheral neuropathy   . RBBB   . Seizures ([place]   . Sepsis ([place] 01/19/2018  . Sinus tachycardia    a. persistent since 2009.  .Marland KitchenSmokers' cough ([place]   . Status post mastectomy, right 01/14/2020  . Stroke ([facility] 1989   left sided weakness  . TIA (transient ischemic attack)   . Tobacco abuse   . Vulvar abscess 01/23/2018    SURGICAL HISTORY: Past Surgical History:  Procedure Laterality Date  . APPLICATION OF WOUND VAC Right 01/29/2020   Procedure: APPLICATION OF WOUND VAC;  Surgeon: RRonny Bacon MD;  Location: [facility] ORS;  Service: General;  Laterality: Right;  . BREAST BIOPSY Right 11/06/2019   uKoreacore path pending venus clip  . HERNIA REPAIR Left   . INCISION AND DRAINAGE ABSCESS N/A 01/26/2018   Procedure: INCISION AND DEBRIDEMENT OF VULVAR NECROTIZING SOFT TISSUE INFECTION;  Surgeon: WGreer Pickerel MD;  [place]  Service: General;  Laterality: N/A;  . INCISION AND DRAINAGE PERIRECTAL ABSCESS N/A 01/22/2018   Procedure: IRRIGATION AND  DEBRIDEMENT LABIAL/VULVAR AREA;  Surgeon: BCoralie Keens MD;  [place]  Service: General;  Laterality: N/A;  . INCISION AND DRAINAGE PERIRECTAL ABSCESS N/A 01/29/2018   Procedure: IRRIGATION AND DEBRIDEMENT VULVA;  Surgeon: HExcell Seltzer MD;  [place]  Service: General;  Laterality: N/A;  . INGUINAL HERNIA REPAIR Left 04/08/2015   Procedure: OPEN LEFT INGUINAL HERNIA REPAIR WITH MESH;  Surgeon: ARalene Ok MD;  [place]  Service: General;  Laterality: Left;  . INSERTION OF MESH Left 04/08/2015   Procedure: INSERTION OF MESH;  Surgeon: ARalene Ok MD;  [place]  Service: General;  Laterality: Left;  . LAPAROSCOPY     Endometriosis  . LEFT HEART CATHETERIZATION WITH CORONARY ANGIOGRAM N/A 12/05/2013   Procedure: LEFT HEART CATHETERIZATION WITH CORONARY ANGIOGRAM;  Surgeon: JJettie Booze MD;  [facility];  Service: Cardiovascular;  Laterality: N/A;  . MASTECTOMY Right   . right kidney drained    . SIMPLE MASTECTOMY WITH AXILLARY SENTINEL NODE BIOPSY Right 12/23/2019   Procedure: Right SIMPLE MASTECTOMY WITH AXILLARY SENTINEL NODE BIOPSY;  Surgeon: RRonny Bacon MD;  Location: [facility] ORS;  Service: General;  Laterality: Right;  . TEE WITHOUT CARDIOVERSION N/A 11/28/2013   Procedure: TRANSESOPHAGEAL ECHOCARDIOGRAM (TEE);  Surgeon: PWonda ChengNahser,  MD;  Location: [place];  Service: Cardiovascular;  Laterality: N/A;  . WOUND DEBRIDEMENT Right 01/29/2020   Procedure: DEBRIDEMENT WOUND, Excisional debridement skin, subcutaneous and muscle right chest wall;  Surgeon: Ronny Bacon, MD;  Location: [facility] ORS;  Service: General;  Laterality: Right;    SOCIAL HISTORY: Social History   Socioeconomic History  . Marital status: Married    Spouse name: Not on file  . Number of children: 2  . Years of education: Not on file  . Highest education level: Not on file  Occupational History  . Not on file  Tobacco Use  . Smoking status:  Some Days    Packs/day: 0.25    Years: 36.00    Pack years: 9.00    Types: Cigarettes    Start date: 07/11/1981  . Smokeless tobacco: Never  . Tobacco comments:    1 ppd now  Vaping Use  . Vaping Use: Some days  Substance and Sexual Activity  . Alcohol use: No  . Drug use: No  . Sexual activity: Not Currently  Other Topics Concern  . Not on file  Social History Narrative   ** Merged History Encounter **       Lives in [place]   Married but lives with Boyfriend - not legally separated   Disabled - for BiPolar, Seizure disorder, diabetes   Formerly worked at [employer] 1 ppd; no alcohol. 2 step sons; no biologic children.       Social Determinants of Health   Financial Resource Strain: Not on file  Food Insecurity: No Food Insecurity  . Worried About Charity fundraiser in the Last Year: Never true  . Ran Out of Food in the Last Year: Never true  Transportation Needs: Not on file  Physical Activity: Not on file  Stress: Not on file  Social Connections: Not on file  Intimate Partner Violence: Not on file    FAMILY HISTORY: Family History  Problem Relation Age of Onset  . Venous thrombosis Brother   . Other Brother        BRAIN TUMOR  . Asthma Father   . Diabetes Father   . Coronary artery disease Mother   . Hypertension Mother   . Diabetes Mother   . Breast cancer Mother 56  . Asthma Sister   . Diabetes type II Brother   . Diabetes Brother     ALLERGIES:  is allergic to adhesive [tape], metoprolol, montelukast, morphine sulfate, penicillins, diltiazem, gabapentin, and midodrine.  MEDICATIONS:  Current Outpatient Medications  Medication Sig Dispense Refill  . albuterol (VENTOLIN HFA) 108 (90 Base) MCG/ACT inhaler Inhale 2 puffs into the lungs every 6 (six) hours as needed for wheezing or shortness of breath.    . ALPRAZolam (XANAX) 1 MG tablet Take 1 mg by mouth 4 (four) times daily as needed for anxiety.     Marland Kitchen anastrozole (ARIMIDEX) 1 MG tablet  Take 1 tablet (1 mg total) by mouth daily. 30 tablet 3  . aspirin EC 81 MG tablet Take 81 mg by mouth daily before breakfast.    . blood glucose meter kit and supplies Use up to four times daily as directed. (FOR ICD-10 E10.9, E11.9). 1 each 0  . budesonide-formoterol (SYMBICORT) 80-4.5 MCG/ACT inhaler INHALE 2 PUFFS BY MOUTH EVERY 12 HOURS TO PREVENT COUGH OR WHEEZING *RINSE MOUTH AFTER EACH USE* 10.2 g 2  . busPIRone (BUSPAR) 30 MG tablet Take 30 mg by mouth 2 (two)  times daily.    . citalopram (CELEXA) 20 MG tablet Take 1 tablet (20 mg total) by mouth daily. 30 tablet 2  . desvenlafaxine (PRISTIQ) 100 MG 24 hr tablet Take 100 mg by mouth daily.    Marland Kitchen Desvenlafaxine ER 100 MG TB24 Take 1 tablet by mouth every morning.    . diclofenac Sodium (VOLTAREN) 1 % GEL Apply 2 g topically 3 (three) times daily as needed. For pain. 100 g 0  . Empagliflozin-metFORMIN HCl ER 25-1000 MG TB24 Take 1 tablet by mouth daily with breakfast.    . EYLEA 2 MG/0.05ML SOSY     . FLUoxetine (PROZAC) 40 MG capsule Take 40 mg by mouth daily.    Marland Kitchen glucose blood test strip Use as instructed 300 each 0  . Insulin Pen Needle 32G X 4 MM MISC Used to give insulin injections twice daily. 100 each 11  . insulin regular human CONCENTRATED (HUMULIN R) 500 UNIT/ML injection Inject 30 Units into the skin 3 (three) times daily with meals. 30 U BID    . Insulin Syringe-Needle U-100 (GLOBAL INJECT EASE INSULIN SYR) 30G X 1/2" 1 ML MISC See admin instructions.    . Lido-Capsaicin-Men-Methyl Sal 0.5-0.035-5-20 % PTCH Apply 1 patch to the skin every 8 hours as needed for pain. Rotate sites of patch. 30 patch 0  . mupirocin ointment (BACTROBAN) 2 % Apply 1 application topically 2 (two) times daily. Apply to the affected area 2 times a day 22 g 3  . pregabalin (LYRICA) 300 MG capsule Take 1 capsule (300 mg total) by mouth 2 (two) times daily. For nerve pain. 180 capsule 0  . ReliOn Ultra Thin Lancets 30G MISC Use as directed for blood sugar  checks. 300 each 0  . rOPINIRole (REQUIP) 1 MG tablet TAKE 1 TABLET BY MOUTH ONCE DAILY AT BEDTIME FOR  RESTLESS  LEGS 90 tablet 0  . rosuvastatin (CRESTOR) 20 MG tablet Take 1 tablet (20 mg total) by mouth daily. For cholesterol. 90 tablet 3  . spironolactone (ALDACTONE) 50 MG tablet Take 50 mg by mouth daily.    Marland Kitchen tobramycin (TOBREX) 0.3 % ophthalmic solution 1 drop 4 (four) times daily.    Marland Kitchen topiramate (TOPAMAX) 50 MG tablet Take 1 tablet (50 mg total) by mouth 2 (two) times daily. For headache prevention. 180 tablet 1  . traZODone (DESYREL) 100 MG tablet Take 200 mg by mouth at bedtime.    Marland Kitchen UBRELVY 50 MG TABS Take by mouth.    Marland Kitchen ULTICARE MINI PEN NEEDLES 31G X 6 MM MISC USE AS DIRECTED 3 TIMES DAILY 100 each 0  . OXYGEN Inhale 2-4 L into the lungs 2 (two) times daily as needed (shortness of breath).  (Patient not taking: Reported on 05/12/2021)     No current facility-administered medications for this visit.      Marland Kitchen  PHYSICAL EXAMINATION: ECOG PERFORMANCE STATUS: 1 - Symptomatic but completely ambulatory  Vitals:   05/12/21 1056  BP: (!) 125/51  Pulse: 64  Resp: 18  Temp: (!) 97 F (36.1 C)  SpO2: 99%   Filed Weights   05/12/21 1050  Weight: 224 lb (101.6 kg)    Physical Exam HENT:     Head: Normocephalic and atraumatic.     Mouth/Throat:     Pharynx: No oropharyngeal exudate.  Eyes:     Pupils: Pupils are equal, round, and reactive to light.  Cardiovascular:     Rate and Rhythm: Normal rate and regular rhythm.  Pulmonary:  Effort: No respiratory distress.     Breath sounds: No wheezing.     Comments: Decreased breath sounds bilaterally the bases. Abdominal:     General: Bowel sounds are normal. There is no distension.     Palpations: Abdomen is soft. There is no mass.     Tenderness: There is no abdominal tenderness. There is no guarding or rebound.  Musculoskeletal:        General: No tenderness. Normal range of motion.     Cervical back: Normal range of  motion and neck supple.  Skin:    General: Skin is warm.  Neurological:     Mental Status: She is alert and oriented to person, place, and time.  Psychiatric:        Mood and Affect: Affect normal.     LABORATORY DATA:  I have reviewed the data as listed Lab Results  Component Value Date   WBC 8.1 05/12/2021   HGB 12.8 05/12/2021   HCT 40.0 05/12/2021   MCV 87.3 05/12/2021   PLT 195 05/12/2021   Recent Labs    05/17/20 0307 05/17/20 2336 03/10/21 1018 04/14/21 0853 05/12/21 1015  NA  --    < > 131* 133* 135  K  --    < > 4.5 4.6 4.6  CL  --    < > 98 99 98  CO2  --    < > 25 27 27   GLUCOSE  --    < > 403* 416* 317*  BUN  --    < > 23* 29* 25*  CREATININE  --    < > 1.05* 1.24* 1.04*  CALCIUM  --    < > 9.0 9.1 9.1  GFRNONAA  --    < > >60 52* >60  PROT 8.2*   < > 8.8* 9.4* 8.6*  ALBUMIN 3.3*   < > 3.9 4.0 3.8  AST 21   < > 16 16 14*  ALT 21   < > 18 18 18   ALKPHOS 130*   < > 170* 166* 191*  BILITOT 1.0   < > 0.8 1.0 0.8  BILIDIR 0.2  --   --   --   --   IBILI 0.8  --   --   --   --    < > = values in this interval not displayed.       RADIOGRAPHIC STUDIES: I have personally reviewed the radiological images as listed and agreed with the findings in the report. DG Ribs Unilateral W/Chest Left  Result Date: 04/14/2021 CLINICAL DATA:  [place].  Left rib pain. EXAM: LEFT RIBS AND CHEST - 3+ VIEW COMPARISON:  03/03/2021 FINDINGS: The cardiac silhouette, mediastinal and hilar contours are within normal limits. Chronic pulmonary changes no acute superimposed pulmonary process. No pleural effusion or pneumothorax. Dedicated views of the left ribs demonstrate a nondisplaced fracture of the ninth anterolateral rib. No other definite rib fractures are identified. IMPRESSION: 1. Nondisplaced left ninth anterolateral rib fracture. 2. Chronic pulmonary changes.  No acute cardiopulmonary findings. Electronically Signed   By: Marijo Sanes M.D.   On: 04/14/2021 15:28   DG Thoracic  Spine 2 View  Result Date: 04/14/2021 CLINICAL DATA:  [place].  Back pain. EXAM: THORACIC SPINE 2 VIEWS COMPARISON:  None. FINDINGS: Normal alignment of the thoracic vertebral bodies. Moderate degenerative changes. No acute bony findings or abnormal paraspinal soft tissue swelling. The visualized posterior ribs are intact. IMPRESSION: Degenerative changes but no acute bony findings.  Electronically Signed   By: Marijo Sanes M.D.   On: 04/14/2021 15:29   DG Lumbar Spine 2-3 Views  Result Date: 04/14/2021 CLINICAL DATA:  [place].  Back pain. EXAM: LUMBAR SPINE - 2-3 VIEW COMPARISON:  09/05/2018 FINDINGS: Normal alignment of the lumbar vertebral bodies. Disc spaces and vertebral bodies are maintained. The facets are normally aligned. No pars defects. The visualized bony pelvis is intact. IMPRESSION: Normal alignment and no acute bony findings. Electronically Signed   By: Marijo Sanes M.D.   On: 04/14/2021 15:31     ASSESSMENT & PLAN:   Carcinoma of lower-outer quadrant of right breast in female, estrogen receptor positive ([place]) # T2N0- ER/PR-positive; premenopausal currently on Anastrazole [stopped tam sec to poor tolerance].  Continue  EligardDepo 11.25 mg every 3 months. CT AUG, 29th 2022- CT C/A/P- NED.   #Continue anastrozole + Eligard for now.  Tolerating fairly well.  Eligard- today  # Headache/migraines-awaiting awaiting neurology evaluation do not suspect for malignancy; discussed with PCP. STABLE.    #Depression/anxiety/bipolar-currently on BuSpar/Celexa/trazodone As per psychiatry- STABLE.-  #Chronic respiratory failure-CHF/COPD; STABLE; SEP 2022-CT scan [bronchiolitis from smoking]; discussed at length regarding quitting smoking/ states cutting down.   #Poorly controlled diabetes-on insulin; FBG AM- 317- [Dr.O'Connell endocrinology]-  Again stressed the importance of glucose control-STABLE; congratulated the patient on weight loss.  Continue-dietary modification/increasing  activity.  #Peripheral neuropathy grade 2-3; patient goes on with a walker/cane at baseline- STABLE.   #Genetics: mom- breast cancer; older brother at 62s- brain tumor; dad- brain tumor. # Genetics: Discussed with the patient majority of breast cancers are sporadic however 10 to 20% at risk of genetic/hereditary cancer syndromes.  Discussed importance of genetic counseling/genetic testing.  Patient interested in genetic counseling.  We will make a referral.   DISPOSITION:  # Eligard today # refer to genetics re; breast cancer /coordinate appointment with next  office visit.  # follow up in 3 month- MD; labs- cbc/cmp/ca-27-29; Eligard-Dr.B    All questions were answered. The patient knows to call the clinic with any problems, questions or concerns.    Cammie Sickle, MD 05/12/2021 1:00 PM

## 2021-05-13 LAB — CANCER ANTIGEN 27.29: CA 27.29: 31.4 U/mL (ref 0.0–38.6)

## 2021-05-14 DIAGNOSIS — C50511 Malignant neoplasm of lower-outer quadrant of right female breast: Secondary | ICD-10-CM | POA: Diagnosis not present

## 2021-05-14 DIAGNOSIS — N182 Chronic kidney disease, stage 2 (mild): Secondary | ICD-10-CM | POA: Diagnosis not present

## 2021-05-14 DIAGNOSIS — I2510 Atherosclerotic heart disease of native coronary artery without angina pectoris: Secondary | ICD-10-CM | POA: Diagnosis not present

## 2021-05-14 DIAGNOSIS — I130 Hypertensive heart and chronic kidney disease with heart failure and stage 1 through stage 4 chronic kidney disease, or unspecified chronic kidney disease: Secondary | ICD-10-CM | POA: Diagnosis not present

## 2021-05-14 DIAGNOSIS — E1165 Type 2 diabetes mellitus with hyperglycemia: Secondary | ICD-10-CM | POA: Diagnosis not present

## 2021-05-14 DIAGNOSIS — E1142 Type 2 diabetes mellitus with diabetic polyneuropathy: Secondary | ICD-10-CM | POA: Diagnosis not present

## 2021-05-14 DIAGNOSIS — E11622 Type 2 diabetes mellitus with other skin ulcer: Secondary | ICD-10-CM | POA: Diagnosis not present

## 2021-05-14 DIAGNOSIS — F319 Bipolar disorder, unspecified: Secondary | ICD-10-CM | POA: Diagnosis not present

## 2021-05-14 DIAGNOSIS — J449 Chronic obstructive pulmonary disease, unspecified: Secondary | ICD-10-CM | POA: Diagnosis not present

## 2021-05-14 DIAGNOSIS — E1151 Type 2 diabetes mellitus with diabetic peripheral angiopathy without gangrene: Secondary | ICD-10-CM | POA: Diagnosis not present

## 2021-05-14 DIAGNOSIS — E1122 Type 2 diabetes mellitus with diabetic chronic kidney disease: Secondary | ICD-10-CM | POA: Diagnosis not present

## 2021-05-14 DIAGNOSIS — L89622 Pressure ulcer of left heel, stage 2: Secondary | ICD-10-CM | POA: Diagnosis not present

## 2021-05-14 DIAGNOSIS — G40909 Epilepsy, unspecified, not intractable, without status epilepticus: Secondary | ICD-10-CM | POA: Diagnosis not present

## 2021-05-14 DIAGNOSIS — I5032 Chronic diastolic (congestive) heart failure: Secondary | ICD-10-CM | POA: Diagnosis not present

## 2021-05-14 DIAGNOSIS — R519 Headache, unspecified: Secondary | ICD-10-CM | POA: Diagnosis not present

## 2021-05-14 DIAGNOSIS — I69334 Monoplegia of upper limb following cerebral infarction affecting left non-dominant side: Secondary | ICD-10-CM | POA: Diagnosis not present

## 2021-05-16 ENCOUNTER — Other Ambulatory Visit: Payer: Self-pay | Admitting: Primary Care

## 2021-05-16 DIAGNOSIS — G2581 Restless legs syndrome: Secondary | ICD-10-CM

## 2021-05-17 ENCOUNTER — Telehealth: Payer: Self-pay | Admitting: *Deleted

## 2021-05-17 NOTE — Chronic Care Management (AMB) (Signed)
  Care Management   Note  05/17/2021 Name: Monchel Pollitt MRN: [id number] DOB: 1970/09/05  Alexarae Oliva is a 50 y.o. year old female who is a primary care patient of Pleas Koch, NP and is actively engaged with the care management team. I reached out to [employer] by phone today to assist with re-scheduling a follow up visit with the RN Case Manager  Follow up plan: Unsuccessful telephone outreach attempt made. A HIPAA compliant phone message was left for the patient providing contact information and requesting a return call.   Julian Hy, [place] Management  Direct Dial: [phone]

## 2021-05-21 DIAGNOSIS — E1122 Type 2 diabetes mellitus with diabetic chronic kidney disease: Secondary | ICD-10-CM | POA: Diagnosis not present

## 2021-05-21 DIAGNOSIS — N182 Chronic kidney disease, stage 2 (mild): Secondary | ICD-10-CM | POA: Diagnosis not present

## 2021-05-21 DIAGNOSIS — F319 Bipolar disorder, unspecified: Secondary | ICD-10-CM | POA: Diagnosis not present

## 2021-05-21 DIAGNOSIS — G4733 Obstructive sleep apnea (adult) (pediatric): Secondary | ICD-10-CM | POA: Diagnosis not present

## 2021-05-21 DIAGNOSIS — I130 Hypertensive heart and chronic kidney disease with heart failure and stage 1 through stage 4 chronic kidney disease, or unspecified chronic kidney disease: Secondary | ICD-10-CM | POA: Diagnosis not present

## 2021-05-21 DIAGNOSIS — I5032 Chronic diastolic (congestive) heart failure: Secondary | ICD-10-CM | POA: Diagnosis not present

## 2021-05-21 DIAGNOSIS — I69334 Monoplegia of upper limb following cerebral infarction affecting left non-dominant side: Secondary | ICD-10-CM | POA: Diagnosis not present

## 2021-05-21 DIAGNOSIS — E1151 Type 2 diabetes mellitus with diabetic peripheral angiopathy without gangrene: Secondary | ICD-10-CM | POA: Diagnosis not present

## 2021-05-21 DIAGNOSIS — I2510 Atherosclerotic heart disease of native coronary artery without angina pectoris: Secondary | ICD-10-CM | POA: Diagnosis not present

## 2021-05-21 DIAGNOSIS — C50511 Malignant neoplasm of lower-outer quadrant of right female breast: Secondary | ICD-10-CM | POA: Diagnosis not present

## 2021-05-21 DIAGNOSIS — G40909 Epilepsy, unspecified, not intractable, without status epilepticus: Secondary | ICD-10-CM | POA: Diagnosis not present

## 2021-05-21 DIAGNOSIS — F419 Anxiety disorder, unspecified: Secondary | ICD-10-CM | POA: Diagnosis not present

## 2021-05-21 DIAGNOSIS — E1165 Type 2 diabetes mellitus with hyperglycemia: Secondary | ICD-10-CM | POA: Diagnosis not present

## 2021-05-21 DIAGNOSIS — E1142 Type 2 diabetes mellitus with diabetic polyneuropathy: Secondary | ICD-10-CM | POA: Diagnosis not present

## 2021-05-21 DIAGNOSIS — R519 Headache, unspecified: Secondary | ICD-10-CM | POA: Diagnosis not present

## 2021-05-21 DIAGNOSIS — J449 Chronic obstructive pulmonary disease, unspecified: Secondary | ICD-10-CM | POA: Diagnosis not present

## 2021-05-24 DIAGNOSIS — I69334 Monoplegia of upper limb following cerebral infarction affecting left non-dominant side: Secondary | ICD-10-CM | POA: Insufficient documentation

## 2021-05-24 DIAGNOSIS — Z66 Do not resuscitate: Secondary | ICD-10-CM | POA: Diagnosis not present

## 2021-05-24 DIAGNOSIS — I2510 Atherosclerotic heart disease of native coronary artery without angina pectoris: Secondary | ICD-10-CM | POA: Diagnosis not present

## 2021-05-24 DIAGNOSIS — I5032 Chronic diastolic (congestive) heart failure: Secondary | ICD-10-CM | POA: Diagnosis not present

## 2021-05-25 ENCOUNTER — Ambulatory Visit (INDEPENDENT_AMBULATORY_CARE_PROVIDER_SITE_OTHER): Payer: BC Managed Care – PPO | Admitting: *Deleted

## 2021-05-25 DIAGNOSIS — E119 Type 2 diabetes mellitus without complications: Secondary | ICD-10-CM

## 2021-05-25 DIAGNOSIS — F3112 Bipolar disorder, current episode manic without psychotic features, moderate: Secondary | ICD-10-CM

## 2021-05-25 DIAGNOSIS — J449 Chronic obstructive pulmonary disease, unspecified: Secondary | ICD-10-CM

## 2021-05-25 DIAGNOSIS — S2232XS Fracture of one rib, left side, sequela: Secondary | ICD-10-CM

## 2021-05-25 NOTE — Patient Instructions (Signed)
Visit Information  (Copy and paste patient goals from clinical care plan here)  The patient verbalized understanding of instructions, educational materials, and care plan provided today and declined offer to receive copy of patient instructions, educational materials, and care plan.   Telephone follow up appointment with care management team member scheduled for:06/24/21  Eduard Clos MSW, LCSW Licensed Clinical Social Worker [place] [phone]

## 2021-05-25 NOTE — Chronic Care Management (AMB) (Signed)
Chronic Care Management    Clinical Social Work Note  05/25/2021 Name: [name] MRN: [id number] DOB: 1971/02/21  [name] is a 50 y.o. year old female who is a primary care patient of Pleas Koch, NP. The CCM team was consulted to assist the patient with chronic disease management and/or care coordination needs related to: Mental Health Counseling and Resources.   Engaged with patient by telephone for follow up visit in response to provider referral for social work chronic care management and care coordination services.   Consent to Services:  The patient was given information about Chronic Care Management services, agreed to services, and gave verbal consent prior to initiation of services.  Please see initial visit note for detailed documentation.   Patient agreed to services and consent obtained.   Assessment: Review of patient past medical history, allergies, medications, and health status, including review of relevant consultants reports was performed today as part of a comprehensive evaluation and provision of chronic care management and care coordination services.     SDOH (Social Determinants of Health) assessments and interventions performed:    Advanced Directives Status: Not addressed in this encounter.  CCM Care Plan  Allergies  Allergen Reactions   Adhesive [Tape] Rash and Other (See Comments)    TAKES OFF THE SKIN (CERTAIN MEDICAL TAPES DO THIS!!)   Metoprolol Shortness Of Breath    Occurrence of shortness of breath after 3 days   Montelukast Shortness Of Breath   Morphine Sulfate Anaphylaxis, Shortness Of Breath and Nausea And Vomiting    Swollen Throat - Able to tolerate dilaudid   Penicillins Anaphylaxis, Hives and Shortness Of Breath    Throat swells Has patient had a PCN reaction causing immediate rash, facial/tongue/throat swelling, SOB or lightheadedness with hypotension: Yes Has patient had a PCN reaction causing severe rash involving mucus  membranes or skin necrosis: No Has patient had a PCN reaction that required hospitalization: Yes Has patient had a PCN reaction occurring within the last 10 years: No If all of the above answers are "NO", then may proceed with Cephalosporin use.    Diltiazem Swelling   Gabapentin Swelling   Midodrine     Lightheaded and falling down    Outpatient Encounter Medications as of 05/25/2021  Medication Sig Note   albuterol (VENTOLIN HFA) 108 (90 Base) MCG/ACT inhaler Inhale 2 puffs into the lungs every 6 (six) hours as needed for wheezing or shortness of breath.    ALPRAZolam (XANAX) 1 MG tablet Take 1 mg by mouth 4 (four) times daily as needed for anxiety.     anastrozole (ARIMIDEX) 1 MG tablet Take 1 tablet (1 mg total) by mouth daily.    aspirin EC 81 MG tablet Take 81 mg by mouth daily before breakfast.    blood glucose meter kit and supplies Use up to four times daily as directed. (FOR ICD-10 E10.9, E11.9).    budesonide-formoterol (SYMBICORT) 80-4.5 MCG/ACT inhaler INHALE 2 PUFFS BY MOUTH EVERY 12 HOURS TO PREVENT COUGH OR WHEEZING *RINSE MOUTH AFTER EACH USE*    busPIRone (BUSPAR) 30 MG tablet Take 30 mg by mouth 2 (two) times daily.    citalopram (CELEXA) 20 MG tablet Take 1 tablet (20 mg total) by mouth daily. 02/15/2021: Patient states she takes 2 per day.   desvenlafaxine (PRISTIQ) 100 MG 24 hr tablet Take 100 mg by mouth daily.    Desvenlafaxine ER 100 MG TB24 Take 1 tablet by mouth every morning.    diclofenac Sodium (VOLTAREN)  1 % GEL Apply 2 g topically 3 (three) times daily as needed. For pain.    Empagliflozin-metFORMIN HCl ER 25-1000 MG TB24 Take 1 tablet by mouth daily with breakfast.    EYLEA 2 MG/0.05ML SOSY     FLUoxetine (PROZAC) 40 MG capsule Take 40 mg by mouth daily.    glucose blood test strip Use as instructed    Insulin Pen Needle 32G X 4 MM MISC Used to give insulin injections twice daily.    insulin regular human CONCENTRATED (HUMULIN R) 500 UNIT/ML injection  Inject 30 Units into the skin 3 (three) times daily with meals. 30 U BID    Insulin Syringe-Needle U-100 (GLOBAL INJECT EASE INSULIN SYR) 30G X 1/2" 1 ML MISC See admin instructions.    Lido-Capsaicin-Men-Methyl Sal 0.5-0.035-5-20 % PTCH Apply 1 patch to the skin every 8 hours as needed for pain. Rotate sites of patch.    mupirocin ointment (BACTROBAN) 2 % Apply 1 application topically 2 (two) times daily. Apply to the affected area 2 times a day    OXYGEN Inhale 2-4 L into the lungs 2 (two) times daily as needed (shortness of breath).  (Patient not taking: Reported on 05/12/2021)    pregabalin (LYRICA) 300 MG capsule Take 1 capsule (300 mg total) by mouth 2 (two) times daily. For nerve pain.    ReliOn Ultra Thin Lancets 30G MISC Use as directed for blood sugar checks.    rOPINIRole (REQUIP) 1 MG tablet TAKE 1 TABLET BY MOUTH EVERY DAY AT BEDTIME FOR  RESTLESS  LEG    rosuvastatin (CRESTOR) 20 MG tablet Take 1 tablet (20 mg total) by mouth daily. For cholesterol.    spironolactone (ALDACTONE) 50 MG tablet Take 50 mg by mouth daily.    tobramycin (TOBREX) 0.3 % ophthalmic solution 1 drop 4 (four) times daily.    topiramate (TOPAMAX) 50 MG tablet Take 1 tablet (50 mg total) by mouth 2 (two) times daily. For headache prevention.    traZODone (DESYREL) 100 MG tablet Take 200 mg by mouth at bedtime.    UBRELVY 50 MG TABS Take by mouth.    ULTICARE MINI PEN NEEDLES 31G X 6 MM MISC USE AS DIRECTED 3 TIMES DAILY    No facility-administered encounter medications on file as of 05/25/2021.    Patient Active Problem List   Diagnosis Date Noted   Headache disorder 03/08/2021   Dysuria 03/05/2021   Pressure sore 01/29/2021   Hyperlipidemia associated with type 2 diabetes mellitus ([place]) 01/13/2021   Fatigue 12/01/2020   Acute on chronic diastolic heart failure ([place]) 11/16/2020   Chronic migraine without aura without status migrainosus, not intractable 10/01/2020   Dizziness 10/01/2020   Microscopic  hematuria 07/01/2020   Pulmonary nodules 07/01/2020   Hav (hallux abducto valgus), unspecified laterality 06/11/2020   Venous ulcer of ankle, left ([place]) 05/15/2020   Contracture of hand joint 05/15/2020   Acute kidney injury superimposed on CKD ([place])    Hyperlipidemia    Carcinoma of lower-outer quadrant of right breast in female, estrogen receptor positive ([place]) 11/12/2019   Restrictive lung disease 10/08/2019   Postmenopausal bleeding 08/30/2019   Carotid stenosis, asymptomatic, right 08/16/2019   Nausea and vomiting 07/15/2019   Cellulitis of left lower extremity 06/04/2019   Multiple wounds of skin 06/04/2019   Other injury of unspecified body region, initial encounter 06/04/2019   (HFpEF) heart failure with preserved ejection fraction ([place]) 11/16/2018   Left-sided weakness 09/13/2018   Weakness of left lower extremity 09/05/2018  Recurrent falls 09/05/2018   PAD (peripheral artery disease) ([place]) 08/27/2018   Chronic congestive heart failure ([place]) 08/02/2018   Vaginal itching 06/15/2018   Chronic upper extremity pain (Secondary Area of Pain) (Bilateral) 04/23/2018   Diabetic polyneuropathy associated with type 2 diabetes mellitus ([place]) 04/23/2018   Long term prescription benzodiazepine use 04/23/2018   Neurogenic pain 04/23/2018   Vitamin D insufficiency 01/29/2018   Insulin dependent type 2 diabetes mellitus ([place]) 01/23/2018   DNR (do not resuscitate) 01/23/2018   CKD stage 3 due to type 2 diabetes mellitus ([place]) 01/23/2018   IBS (irritable bowel syndrome) 01/19/2018   Chronic lower extremity pain (Primary Area of Pain) (Bilateral) 01/08/2018   Chronic low back pain [facility] Area of Pain) (Bilateral) w/ sciatica (Bilateral) 01/08/2018   Chronic pain syndrome 01/08/2018   Pharmacologic therapy 01/08/2018   Disorder of skeletal system 01/08/2018   Problems influencing health status 01/08/2018   Long term current use of opiate analgesic 01/08/2018   Restless leg syndrome  08/02/2017   Nocturnal hypoxemia 03/29/2017   Vision disturbance 02/28/2017   CKD (chronic kidney disease), stage II 02/28/2017   Visual loss, bilateral    Chronic respiratory failure with hypoxia (HCC)/ nocturnal 02 dep  10/28/2016   Upper airway cough syndrome 08/22/2016   Cigarette smoker 06/15/2016   Simple chronic bronchitis ([place])    Coronary artery disease involving native heart without angina pectoris 05/16/2016   Chronic diastolic CHF (congestive heart failure) ([place]) 11/04/2015   Orthopnea 11/03/2015   Bilateral leg edema    Diabetes ([place])    COPD exacerbation ([place]) 10/15/2015   Fracture of rib, closed 10/15/2015   Venous stasis of both lower extremities 03/29/2015   Preventative health care 02/04/2015   Hypertension associated with diabetes ([place]) 01/02/2015   Inguinal hernia 11/27/2014   Allergic rhinitis with postnasal drip 01/21/2014   Aortic valve disorders 04/18/2013   Sleep apnea 05/22/2012   Syncope and collapse 10/09/2011   Seizure disorder ([place]) 10/09/2011   Bipolar disorder ([place]) 10/09/2011   TIA (transient ischemic attack) 06/22/2011   Constipation 06/15/2011   HYPERTRIGLYCERIDEMIA 07/17/2007   Situational anxiety 05/30/2007   COPD with chronic bronchitis ([place]) 05/30/2007   Morbid (severe) obesity due to excess calories ([place]) 04/23/2007   Tobacco abuse 09/07/2006    Conditions to be addressed/monitored: Depression; Mental Health Concerns   Care Plan : LCSW Plan of Care  Updates made by Deirdre Peer, LCSW since 05/25/2021 12:00 AM     Problem: Stress, anxiety and depression   Priority: High     Long-Range Goal: Provide resources and support to reduce symptoms (anxiety/depression)   Start Date: 04/26/2021  Expected End Date: 07/09/2021  This Visit's Progress: On track  Recent Progress: On track  Priority: High  Note:   Current Barriers:  Chronic Mental Health needs related to medical condition, history of Bipolar D/O, anxiety Limited social  support, Mental Health Concerns , and Lacks knowledge of community resource:   Suicidal Ideation/Homicidal Ideation: No  Clinical Social Work Goal(s):  patient will work with SW  PRN  by telephone to reduce or manage symptoms related to mental health needs demonstrate a reduction in symptoms related to :Anxiety with Excessive Worry, Panic Symptoms,, Bipolar Disorder, and anxiety   Interventions: 05/25/21- Pt has not been able to connect with [facility] for in-person counseling- CSW providing pt with # to call them back.  Pt reports wanting to see if her son "can get paid for caring for me". CSW advised pt she  is not eligible for Medicaid and thus cannot get PCS or CAPS care in home. Her husband still working and so could consider private pay- she is currently getting HHPT, OT and RN visits from [facility] and is followed by [facility] (pt states for Hospice care but I have confirmed today it is Palliative Care).    Patient interviewed and appropriate assessments performed: brief mental health assessment 1:1 collaboration with Pleas Koch, NP regarding development and update of comprehensive plan of care as evidenced by provider attestation and co-signature Patient interviewed and appropriate assessments performed Referred patient to [facility] (mental health provider) for long term follow up and therapy/counseling Solution-Focused Strategies  Active listening / Reflection utilized  Emotional Support Provided Problem Solving [place] strategies reviewed Participation in counseling encouraged    Patient Self Care Activities:  Self administers medications as prescribed Attends all scheduled provider appointments Calls pharmacy for medication refills Calls provider office for new concerns or questions Motivation for treatment Strong family or social support  Patient Coping Strengths:  Supportive Relationships Family Hopefulness  Patient Self Care Deficits:  Needs help getting  linked with support services/counseling  Patient Goals:  call [facility] to arrange in-person counseling - avoid negative self-talk - develop a personal safety plan - have a plan for how to handle bad days - spend time or talk with others at least 2 to 3 times per week - watch for early signs of feeling worse - write in journal every day - begin personal counseling - check out volunteer opportunities - learn and use visualization or guided imagery - call to cancel if needed - keep a calendar with prescription refill dates - keep a calendar with appointment dates  Follow Up Plan:  06/24/21      Follow Up Plan: Appointment scheduled for SW follow up with client by phone on: 06/24/21       Eduard Clos MSW, [place] Licensed Clinical Social Worker [place] [phone]  .

## 2021-05-26 DIAGNOSIS — J449 Chronic obstructive pulmonary disease, unspecified: Secondary | ICD-10-CM | POA: Diagnosis not present

## 2021-05-27 DIAGNOSIS — G40909 Epilepsy, unspecified, not intractable, without status epilepticus: Secondary | ICD-10-CM | POA: Diagnosis not present

## 2021-05-27 DIAGNOSIS — F419 Anxiety disorder, unspecified: Secondary | ICD-10-CM | POA: Diagnosis not present

## 2021-05-27 DIAGNOSIS — F319 Bipolar disorder, unspecified: Secondary | ICD-10-CM | POA: Diagnosis not present

## 2021-05-27 DIAGNOSIS — E1122 Type 2 diabetes mellitus with diabetic chronic kidney disease: Secondary | ICD-10-CM | POA: Diagnosis not present

## 2021-05-27 DIAGNOSIS — C50511 Malignant neoplasm of lower-outer quadrant of right female breast: Secondary | ICD-10-CM | POA: Diagnosis not present

## 2021-05-27 DIAGNOSIS — E1142 Type 2 diabetes mellitus with diabetic polyneuropathy: Secondary | ICD-10-CM | POA: Diagnosis not present

## 2021-05-27 DIAGNOSIS — R519 Headache, unspecified: Secondary | ICD-10-CM | POA: Diagnosis not present

## 2021-05-27 DIAGNOSIS — E1165 Type 2 diabetes mellitus with hyperglycemia: Secondary | ICD-10-CM | POA: Diagnosis not present

## 2021-05-27 DIAGNOSIS — E1151 Type 2 diabetes mellitus with diabetic peripheral angiopathy without gangrene: Secondary | ICD-10-CM | POA: Diagnosis not present

## 2021-05-27 DIAGNOSIS — I69334 Monoplegia of upper limb following cerebral infarction affecting left non-dominant side: Secondary | ICD-10-CM | POA: Diagnosis not present

## 2021-05-27 DIAGNOSIS — N182 Chronic kidney disease, stage 2 (mild): Secondary | ICD-10-CM | POA: Diagnosis not present

## 2021-05-27 DIAGNOSIS — I130 Hypertensive heart and chronic kidney disease with heart failure and stage 1 through stage 4 chronic kidney disease, or unspecified chronic kidney disease: Secondary | ICD-10-CM | POA: Diagnosis not present

## 2021-05-27 DIAGNOSIS — J449 Chronic obstructive pulmonary disease, unspecified: Secondary | ICD-10-CM | POA: Diagnosis not present

## 2021-05-27 DIAGNOSIS — I2510 Atherosclerotic heart disease of native coronary artery without angina pectoris: Secondary | ICD-10-CM | POA: Diagnosis not present

## 2021-05-27 DIAGNOSIS — G4733 Obstructive sleep apnea (adult) (pediatric): Secondary | ICD-10-CM | POA: Diagnosis not present

## 2021-05-27 DIAGNOSIS — I5032 Chronic diastolic (congestive) heart failure: Secondary | ICD-10-CM | POA: Diagnosis not present

## 2021-05-28 ENCOUNTER — Ambulatory Visit
Admission: EM | Admit: 2021-05-28 | Discharge: 2021-05-28 | Disposition: A | Discharge status: Home / Self Care | Payer: BC Managed Care – PPO | Attending: Emergency Medicine | Admitting: Emergency Medicine

## 2021-05-28 ENCOUNTER — Telehealth: Payer: Self-pay

## 2021-05-28 ENCOUNTER — Encounter: Payer: Self-pay | Admitting: Emergency Medicine

## 2021-05-28 ENCOUNTER — Other Ambulatory Visit: Payer: Self-pay

## 2021-05-28 DIAGNOSIS — M5442 Lumbago with sciatica, left side: Secondary | ICD-10-CM

## 2021-05-28 MED ORDER — METHOCARBAMOL 500 MG PO TABS: 500.0000 mg | ORAL_TABLET | Freq: Two times a day (BID) | ORAL | 0 refills | Status: DC | PRN
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
## 2021-05-28 NOTE — Telephone Encounter (Signed)
Noted. Sorry to hear about this! Will await notes.
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
## 2021-05-28 NOTE — Chronic Care Management (AMB) (Signed)
  Care Management   Note  05/28/2021 Name: [name] MRN: [id number] DOB: 09-12-1970  [name] is a 50 y.o. year old female who is a primary care patient of Pleas Koch, NP and is actively engaged with the care management team. I reached out to [facility] by phone today to assist with re-scheduling a follow up visit with the RN Case Manager  Follow up plan: Telephone appointment with care management team member scheduled for: 08/10/2020  Julian Hy, [place], [place] Management  Direct Dial: [phone]

## 2021-05-28 NOTE — Discharge Instructions (Addendum)
Take Tylenol as needed for discomfort.    Take the muscle relaxer as needed for muscle spasm; Do not drive, operate machinery, or drink alcohol with this medication as it can cause drowsiness.   Follow up with your primary care provider or an orthopedist if your symptoms are not improving.     
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
## 2021-06-02 DIAGNOSIS — I130 Hypertensive heart and chronic kidney disease with heart failure and stage 1 through stage 4 chronic kidney disease, or unspecified chronic kidney disease: Secondary | ICD-10-CM | POA: Diagnosis not present

## 2021-06-02 DIAGNOSIS — F319 Bipolar disorder, unspecified: Secondary | ICD-10-CM | POA: Diagnosis not present

## 2021-06-02 DIAGNOSIS — J449 Chronic obstructive pulmonary disease, unspecified: Secondary | ICD-10-CM | POA: Diagnosis not present

## 2021-06-02 DIAGNOSIS — G4733 Obstructive sleep apnea (adult) (pediatric): Secondary | ICD-10-CM | POA: Diagnosis not present

## 2021-06-02 DIAGNOSIS — E1165 Type 2 diabetes mellitus with hyperglycemia: Secondary | ICD-10-CM | POA: Diagnosis not present

## 2021-06-02 DIAGNOSIS — E1142 Type 2 diabetes mellitus with diabetic polyneuropathy: Secondary | ICD-10-CM | POA: Diagnosis not present

## 2021-06-02 DIAGNOSIS — I2510 Atherosclerotic heart disease of native coronary artery without angina pectoris: Secondary | ICD-10-CM | POA: Diagnosis not present

## 2021-06-02 DIAGNOSIS — G40909 Epilepsy, unspecified, not intractable, without status epilepticus: Secondary | ICD-10-CM | POA: Diagnosis not present

## 2021-06-02 DIAGNOSIS — I5032 Chronic diastolic (congestive) heart failure: Secondary | ICD-10-CM | POA: Diagnosis not present

## 2021-06-02 DIAGNOSIS — N182 Chronic kidney disease, stage 2 (mild): Secondary | ICD-10-CM | POA: Diagnosis not present

## 2021-06-02 DIAGNOSIS — E1122 Type 2 diabetes mellitus with diabetic chronic kidney disease: Secondary | ICD-10-CM | POA: Diagnosis not present

## 2021-06-02 DIAGNOSIS — F419 Anxiety disorder, unspecified: Secondary | ICD-10-CM | POA: Diagnosis not present

## 2021-06-02 DIAGNOSIS — C50511 Malignant neoplasm of lower-outer quadrant of right female breast: Secondary | ICD-10-CM | POA: Diagnosis not present

## 2021-06-02 DIAGNOSIS — E1151 Type 2 diabetes mellitus with diabetic peripheral angiopathy without gangrene: Secondary | ICD-10-CM | POA: Diagnosis not present

## 2021-06-02 DIAGNOSIS — I69334 Monoplegia of upper limb following cerebral infarction affecting left non-dominant side: Secondary | ICD-10-CM | POA: Diagnosis not present

## 2021-06-02 DIAGNOSIS — R519 Headache, unspecified: Secondary | ICD-10-CM | POA: Diagnosis not present

## 2021-06-07 DIAGNOSIS — I2510 Atherosclerotic heart disease of native coronary artery without angina pectoris: Secondary | ICD-10-CM | POA: Diagnosis not present

## 2021-06-07 DIAGNOSIS — I5032 Chronic diastolic (congestive) heart failure: Secondary | ICD-10-CM | POA: Diagnosis not present

## 2021-06-09 ENCOUNTER — Telehealth: Payer: Self-pay | Admitting: Primary Care

## 2021-06-09 DIAGNOSIS — E119 Type 2 diabetes mellitus without complications: Secondary | ICD-10-CM

## 2021-06-09 DIAGNOSIS — E1165 Type 2 diabetes mellitus with hyperglycemia: Secondary | ICD-10-CM | POA: Diagnosis not present

## 2021-06-09 DIAGNOSIS — J449 Chronic obstructive pulmonary disease, unspecified: Secondary | ICD-10-CM
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
## 2021-06-09 NOTE — Telephone Encounter (Signed)
It looks like she's gradually been losing weight since May 2022.  According to her CT scans from late August 2022 there was no evidence of cancer. Most of her blood tests from early November 2022 do appear overall stable, but I do encourage her to reach out to her oncologist regarding these concerns.  The weight loss could be secondary to:  Fluid loss from resuming her fluid pill Very high blood sugars Decreased food intake  Without the ability to discuss further it's hard to discern the cause.  Glad her finger is okay!
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
## 2021-06-10 DIAGNOSIS — I2510 Atherosclerotic heart disease of native coronary artery without angina pectoris: Secondary | ICD-10-CM | POA: Diagnosis not present

## 2021-06-10 DIAGNOSIS — I10 Essential (primary) hypertension: Secondary | ICD-10-CM | POA: Diagnosis not present

## 2021-06-10 DIAGNOSIS — I5032 Chronic diastolic (congestive) heart failure: Secondary | ICD-10-CM | POA: Diagnosis not present

## 2021-06-10 NOTE — Telephone Encounter (Signed)
Left message to return call to our office.  

## 2021-06-15 NOTE — Telephone Encounter (Signed)
Left message to return call to our office.  

## 2021-06-16 ENCOUNTER — Encounter: Payer: Self-pay | Admitting: Emergency Medicine

## 2021-06-16 ENCOUNTER — Ambulatory Visit
Admission: EM | Admit: 2021-06-16 | Discharge: 2021-06-16 | Disposition: A | Discharge status: Home / Self Care | Payer: BC Managed Care – PPO | Attending: Emergency Medicine | Admitting: Emergency Medicine

## 2021-06-16 ENCOUNTER — Other Ambulatory Visit: Payer: Self-pay

## 2021-06-16 ENCOUNTER — Telehealth: Payer: Self-pay | Admitting: Internal Medicine

## 2021-06-16 DIAGNOSIS — F319 Bipolar disorder, unspecified: Secondary | ICD-10-CM | POA: Diagnosis not present

## 2021-06-16 DIAGNOSIS — L739 Follicular disorder, unspecified: Secondary | ICD-10-CM

## 2021-06-16 DIAGNOSIS — G40909 Epilepsy, unspecified, not intractable, without status epilepticus: Secondary | ICD-10-CM | POA: Diagnosis not present

## 2021-06-16 DIAGNOSIS — R519 Headache, unspecified: Secondary | ICD-10-CM | POA: Diagnosis not present

## 2021-06-16 DIAGNOSIS — J449 Chronic obstructive pulmonary disease, unspecified: Secondary | ICD-10-CM | POA: Diagnosis not present

## 2021-06-16 DIAGNOSIS — E1122 Type 2 diabetes mellitus with diabetic chronic kidney disease: Secondary | ICD-10-CM | POA: Diagnosis not present

## 2021-06-16 DIAGNOSIS — I2510 Atherosclerotic heart disease of native coronary artery without angina pectoris: Secondary | ICD-10-CM | POA: Diagnosis not present

## 2021-06-16 DIAGNOSIS — E1165 Type 2 diabetes mellitus with hyperglycemia: Secondary | ICD-10-CM | POA: Diagnosis not present

## 2021-06-16 DIAGNOSIS — I130 Hypertensive heart and chronic kidney disease with heart failure and stage 1 through stage 4 chronic kidney disease, or unspecified chronic kidney disease: Secondary | ICD-10-CM | POA: Diagnosis not present

## 2021-06-16 DIAGNOSIS — E1142 Type 2 diabetes mellitus with diabetic polyneuropathy: Secondary | ICD-10-CM | POA: Diagnosis not present

## 2021-06-16 DIAGNOSIS — E1151 Type 2 diabetes mellitus with diabetic peripheral angiopathy without gangrene: Secondary | ICD-10-CM | POA: Diagnosis not present

## 2021-06-16 DIAGNOSIS — C50511 Malignant neoplasm of lower-outer quadrant of right female breast: Secondary | ICD-10-CM | POA: Diagnosis not present

## 2021-06-16 DIAGNOSIS — N182 Chronic kidney disease, stage 2 (mild): Secondary | ICD-10-CM | POA: Diagnosis not present

## 2021-06-16 DIAGNOSIS — G4733 Obstructive sleep apnea (adult) (pediatric): Secondary | ICD-10-CM | POA: Diagnosis not present

## 2021-06-16 DIAGNOSIS — F419 Anxiety disorder, unspecified: Secondary | ICD-10-CM | POA: Diagnosis not present

## 2021-06-16 DIAGNOSIS — I69334 Monoplegia of upper limb following cerebral infarction affecting left non-dominant side: Secondary | ICD-10-CM | POA: Diagnosis not present

## 2021-06-16 DIAGNOSIS — I5032 Chronic diastolic (congestive) heart failure: Secondary | ICD-10-CM | POA: Diagnosis not present

## 2021-06-16 MED ORDER — MUPIROCIN 2 % EX OINT: 1.0000 "application " | TOPICAL_OINTMENT | Freq: Two times a day (BID) | CUTANEOUS | 0 refills | Status: DC

## 2021-06-16 NOTE — ED Notes (Signed)
Pt here with blistering rash to left side of scalp x 1 day.

## 2021-06-16 NOTE — Telephone Encounter (Signed)
Pt called to see if she could get an appt to Dr. Gerrit Halls today. Please give her a call back at [phone]

## 2021-06-16 NOTE — Telephone Encounter (Signed)
fyi

## 2021-06-16 NOTE — ED Provider Notes (Signed)
[name]    CSN: [id number] Arrival date & time: 06/16/21  1352      History   Chief Complaint Chief Complaint  Patient presents with   Rash    HPI Besse Miron is a 50 y.o. female.  Patient presents with rash on her scalp x 1 day.  The rash is tender to touch.  No drainage or open wounds.  No treatments at home.  No fever or other symptoms.  Her medical history of diabetes, hypertension, heart failure, seizures, bipolar disorder, COPD, asthma, breast cancer, chronic pain.  The history is provided by the patient and medical records.   Past Medical History:  Diagnosis Date   Anxiety    takes Prozac daily   Anxiety    Aortic valve calcification    Asthma    Advair and Spirva daily   Asthma    Bipolar disorder (HCC)    Breast cancer, right breast ([place]) 12/23/2019   CAD (coronary artery disease)    a. LHC 11/2013 done for CP/fluid retention: mild disease in prox LAD, mild-mod disease in mRCA, EF 60% with normal LVEDP. b. Normal nuc 03/2016.   Cancer ([place])    Cellulitis and abscess of trunk 03/12/2020   CHF (congestive heart failure) (HCC)    Chronic diastolic CHF (congestive heart failure) (HCC)    Chronic heart failure with preserved ejection fraction ([place]) 11/16/2018   CKD (chronic kidney disease), stage II    COPD (chronic obstructive pulmonary disease) (HCC)    a. nocturnal O2.   COPD (chronic obstructive pulmonary disease) (HCC)    Coronary artery disease    Decreased urine stream    Diabetes mellitus    Diabetes mellitus without complication (HCC)    Dyspnea    Elevated C-reactive protein (CRP) 01/29/2018   Elevated sedimentation rate 01/29/2018   Elevated serum hCG 01/19/2018   Family history of adverse reaction to anesthesia    mom gets nauseated   Foot pain, bilateral 05/22/2018   GERD (gastroesophageal reflux disease)    takes Pepcid daily   History of blood clots    left leg 3-33yr ago   Hyperlipidemia    Hypertension    Hypertriglyceridemia     Inguinal hernia, left 01/2015   Muscle spasm    Open wound of genital labia    Peripheral neuropathy    RBBB    Seizures ([place]    Sepsis ([place] 01/19/2018   Sinus tachycardia    a. persistent since 2009.   Smokers' cough ([place]    Status post mastectomy, right 01/14/2020   Stroke ([facility] 1989   left sided weakness   TIA (transient ischemic attack)    Tobacco abuse    Vulvar abscess 01/23/2018    Patient Active Problem List   Diagnosis Date Noted   Headache disorder 03/08/2021   Dysuria 03/05/2021   Pressure sore 01/29/2021   Hyperlipidemia associated with type 2 diabetes mellitus ([place] 01/13/2021   Fatigue 12/01/2020   Acute on chronic diastolic heart failure ([place] 11/16/2020   Chronic migraine without aura without status migrainosus, not intractable 10/01/2020   Dizziness 10/01/2020   Microscopic hematuria 07/01/2020   Pulmonary nodules 07/01/2020   Hav (hallux abducto valgus), unspecified laterality 06/11/2020   Venous ulcer of ankle, left ([place] 05/15/2020   Contracture of hand joint 05/15/2020   Acute kidney injury superimposed on CKD ([facility]    Hyperlipidemia    Carcinoma of lower-outer quadrant of right breast in female, estrogen receptor positive ([place] 11/12/2019  Restrictive lung disease 10/08/2019   Postmenopausal bleeding 08/30/2019   Carotid stenosis, asymptomatic, right 08/16/2019   Nausea and vomiting 07/15/2019   Cellulitis of left lower extremity 06/04/2019   Multiple wounds of skin 06/04/2019   Other injury of unspecified body region, initial encounter 06/04/2019   (HFpEF) heart failure with preserved ejection fraction ([place]) 11/16/2018   Left-sided weakness 09/13/2018   Weakness of left lower extremity 09/05/2018   Recurrent falls 09/05/2018   PAD (peripheral artery disease) ([place]) 08/27/2018   Chronic congestive heart failure ([place]) 08/02/2018   Vaginal itching 06/15/2018   Chronic upper extremity pain (Secondary Area of Pain) (Bilateral) 04/23/2018   Diabetic  polyneuropathy associated with type 2 diabetes mellitus ([place]) 04/23/2018   Long term prescription benzodiazepine use 04/23/2018   Neurogenic pain 04/23/2018   Vitamin D insufficiency 01/29/2018   Insulin dependent type 2 diabetes mellitus ([place]) 01/23/2018   DNR (do not resuscitate) 01/23/2018   CKD stage 3 due to type 2 diabetes mellitus ([place]) 01/23/2018   IBS (irritable bowel syndrome) 01/19/2018   Chronic lower extremity pain (Primary Area of Pain) (Bilateral) 01/08/2018   Chronic low back pain [facility] Area of Pain) (Bilateral) w/ sciatica (Bilateral) 01/08/2018   Chronic pain syndrome 01/08/2018   Pharmacologic therapy 01/08/2018   Disorder of skeletal system 01/08/2018   Problems influencing health status 01/08/2018   Long term current use of opiate analgesic 01/08/2018   Restless leg syndrome 08/02/2017   Nocturnal hypoxemia 03/29/2017   Vision disturbance 02/28/2017   CKD (chronic kidney disease), stage II 02/28/2017   Visual loss, bilateral    Chronic respiratory failure with hypoxia (HCC)/ nocturnal 02 dep  10/28/2016   Upper airway cough syndrome 08/22/2016   Cigarette smoker 06/15/2016   Simple chronic bronchitis ([place])    Coronary artery disease involving native heart without angina pectoris 05/16/2016   Chronic diastolic CHF (congestive heart failure) ([place]) 11/04/2015   Orthopnea 11/03/2015   Bilateral leg edema    Diabetes ([place])    COPD exacerbation ([place]) 10/15/2015   Fracture of rib, closed 10/15/2015   Venous stasis of both lower extremities 03/29/2015   Preventative health care 02/04/2015   Hypertension associated with diabetes ([place]) 01/02/2015   Inguinal hernia 11/27/2014   Allergic rhinitis with postnasal drip 01/21/2014   Aortic valve disorders 04/18/2013   Sleep apnea 05/22/2012   Syncope and collapse 10/09/2011   Seizure disorder ([place]) 10/09/2011   Bipolar disorder ([place]) 10/09/2011   TIA (transient ischemic attack) 06/22/2011   Constipation 06/15/2011    HYPERTRIGLYCERIDEMIA 07/17/2007   Situational anxiety 05/30/2007   COPD with chronic bronchitis ([place]) 05/30/2007   Morbid (severe) obesity due to excess calories ([place]) 04/23/2007   Tobacco abuse 09/07/2006    Past Surgical History:  Procedure Laterality Date   APPLICATION OF WOUND VAC Right 01/29/2020   Procedure: APPLICATION OF WOUND VAC;  Surgeon: Ronny Bacon, MD;  Location: [facility] ORS;  Service: General;  Laterality: Right;   BREAST BIOPSY Right 11/06/2019   [place] core path pending venus clip   HERNIA REPAIR Left    INCISION AND DRAINAGE ABSCESS N/A 01/26/2018   Procedure: INCISION AND DEBRIDEMENT OF VULVAR NECROTIZING SOFT TISSUE INFECTION;  Surgeon: Greer Pickerel, MD;  Location: [place];  Service: General;  Laterality: N/A;   INCISION AND DRAINAGE PERIRECTAL ABSCESS N/A 01/22/2018   Procedure: IRRIGATION AND DEBRIDEMENT LABIAL/VULVAR AREA;  Surgeon: Coralie Keens, MD;  Location: [place];  Service: General;  Laterality: N/A;   Equality N/A 01/29/2018  Procedure: IRRIGATION AND DEBRIDEMENT VULVA;  Surgeon: Excell Seltzer, MD;  Location: [place];  Service: General;  Laterality: N/A;   INGUINAL HERNIA REPAIR Left 04/08/2015   Procedure: OPEN LEFT INGUINAL HERNIA REPAIR WITH MESH;  Surgeon: Ralene Ok, MD;  Location: [place];  Service: General;  Laterality: Left;   INSERTION OF MESH Left 04/08/2015   Procedure: INSERTION OF MESH;  Surgeon: Ralene Ok, MD;  Location: [place];  Service: General;  Laterality: Left;   LAPAROSCOPY     Endometriosis   LEFT HEART CATHETERIZATION WITH CORONARY ANGIOGRAM N/A 12/05/2013   Procedure: LEFT HEART CATHETERIZATION WITH CORONARY ANGIOGRAM;  Surgeon: Jettie Booze, MD;  Location: [facility] CATH LAB;  Service: Cardiovascular;  Laterality: N/A;   MASTECTOMY Right    right kidney drained     SIMPLE MASTECTOMY WITH AXILLARY SENTINEL NODE BIOPSY Right 12/23/2019   Procedure: Right SIMPLE MASTECTOMY WITH AXILLARY SENTINEL  NODE BIOPSY;  Surgeon: Ronny Bacon, MD;  Location: [facility];  Service: General;  Laterality: Right;   TEE WITHOUT CARDIOVERSION N/A 11/28/2013   Procedure: TRANSESOPHAGEAL ECHOCARDIOGRAM (TEE);  Surgeon: Thayer Headings, MD;  Location: [place];  Service: Cardiovascular;  Laterality: N/A;   WOUND DEBRIDEMENT Right 01/29/2020   Procedure: DEBRIDEMENT WOUND, Excisional debridement skin, subcutaneous and muscle right chest wall;  Surgeon: Ronny Bacon, MD;  Location: [facility];  Service: General;  Laterality: Right;    OB History   No obstetric history on file.      Home Medications    Prior to Admission medications   Medication Sig Start Date End Date Taking? Authorizing Provider  mupirocin ointment (BACTROBAN) 2 % Apply 1 application topically 2 (two) times daily. 06/16/21  Yes Sharion Balloon, NP  albuterol (VENTOLIN HFA) 108 (90 Base) MCG/ACT inhaler Inhale 2 puffs into the lungs every 6 (six) hours as needed for wheezing or shortness of breath.    [provider]  ALPRAZolam Duanne Moron) 1 MG tablet Take 1 mg by mouth 4 (four) times daily as needed for anxiety.  12/02/19   [provider]  anastrozole (ARIMIDEX) 1 MG tablet Take 1 tablet (1 mg total) by mouth daily. 03/02/21   Cammie Sickle, MD  aspirin EC 81 MG tablet Take 81 mg by mouth daily before breakfast.    [provider]  blood glucose meter kit and supplies Use up to four times daily as directed. (FOR ICD-10 E10.9, E11.9). 02/22/21   Pleas Koch, NP  budesonide-formoterol (SYMBICORT) 80-4.5 MCG/ACT inhaler INHALE 2 PUFFS BY MOUTH EVERY 12 HOURS TO PREVENT COUGH OR WHEEZING *RINSE MOUTH AFTER EACH USE* 12/01/20   Pleas Koch, NP  busPIRone (BUSPAR) 30 MG tablet Take 30 mg by mouth 2 (two) times daily.    [provider]  citalopram (CELEXA) 20 MG tablet Take 1 tablet (20 mg total) by mouth daily. 12/17/19   Cammie Sickle, MD  desvenlafaxine (PRISTIQ) 100 MG 24 hr  tablet Take 100 mg by mouth daily.    [provider]  Desvenlafaxine ER 100 MG TB24 Take 1 tablet by mouth every morning. 12/18/20   [provider]  diclofenac Sodium (VOLTAREN) 1 % GEL Apply 2 g topically 3 (three) times daily as needed. For pain. 09/01/20   Pleas Koch, NP  Empagliflozin-metFORMIN HCl ER 25-1000 MG TB24 Take 1 tablet by mouth daily with breakfast. 12/03/20 12/03/21  [provider]  EYLEA 2 MG/0.05ML SOSY  03/10/21   [provider]  FLUoxetine (PROZAC) 40  MG capsule Take 40 mg by mouth daily. 10/27/20   [provider]  glucose blood test strip Use as instructed 12/25/20   Pleas Koch, NP  Insulin Pen Needle 32G X 4 MM MISC Used to give insulin injections twice daily. 08/23/17   Renato Shin, MD  insulin regular human CONCENTRATED (HUMULIN R) 500 UNIT/ML injection Inject 30 Units into the skin 3 (three) times daily with meals. 30 U BID    [provider]  Insulin Syringe-Needle U-100 (GLOBAL INJECT EASE INSULIN SYR) 30G X 1/2" 1 ML MISC See admin instructions. 05/22/20 05/22/21  [provider]  Lido-Capsaicin-Men-Methyl Sal 0.5-0.035-5-20 % PTCH Apply 1 patch to the skin every 8 hours as needed for pain. Rotate sites of patch. 04/28/21   Pleas Koch, NP  methocarbamol (ROBAXIN) 500 MG tablet Take 1 tablet (500 mg total) by mouth 2 (two) times daily as needed for muscle spasms. 05/28/21   Sharion Balloon, NP  mupirocin ointment (BACTROBAN) 2 % Apply 1 application topically 2 (two) times daily. Apply to the affected area 2 times a day 09/29/20   Newt Minion, MD  OXYGEN Inhale 2-4 L into the lungs 2 (two) times daily as needed (shortness of breath).  Patient not taking: Reported on 05/12/2021    [provider]  pregabalin (LYRICA) 300 MG capsule Take 1 capsule (300 mg total) by mouth 2 (two) times daily. For nerve pain. 05/04/21   Pleas Koch, NP  ReliOn Ultra Thin Lancets 30G MISC Use as  directed for blood sugar checks. 12/25/20   Pleas Koch, NP  rOPINIRole (REQUIP) 1 MG tablet TAKE 1 TABLET BY MOUTH EVERY DAY AT BEDTIME FOR  RESTLESS  LEG 05/16/21   Pleas Koch, NP  rosuvastatin (CRESTOR) 20 MG tablet Take 1 tablet (20 mg total) by mouth daily. For cholesterol. 08/18/20   Pleas Koch, NP  spironolactone (ALDACTONE) 50 MG tablet Take 50 mg by mouth daily. 02/16/21   [provider]  tobramycin (TOBREX) 0.3 % ophthalmic solution 1 drop 4 (four) times daily. 05/06/21   [provider]  topiramate (TOPAMAX) 50 MG tablet Take 1 tablet (50 mg total) by mouth 2 (two) times daily. For headache prevention. 04/07/21   Pleas Koch, NP  traZODone (DESYREL) 100 MG tablet Take 200 mg by mouth at bedtime. 11/08/18   [provider]  UBRELVY 50 MG TABS Take by mouth. 04/22/21   [provider]  ULTICARE MINI PEN NEEDLES 31G X 6 MM [place] USE AS DIRECTED 3 TIMES DAILY 05/18/20   Renato Shin, MD    Family History Family History  Problem Relation Age of Onset   Venous thrombosis Brother    Other Brother        BRAIN TUMOR   Asthma Father    Diabetes Father    Coronary artery disease Mother    Hypertension Mother    Diabetes Mother    Breast cancer Mother 58   Asthma Sister    Diabetes type II Brother    Diabetes Brother     Social History Social History   Tobacco Use   Smoking status: Some Days    Packs/day: 0.25    Years: 36.00    Pack years: 9.00    Types: Cigarettes    Start date: 07/11/1981   Smokeless tobacco: Never   Tobacco comments:    1 ppd now  Vaping Use   Vaping Use: Some days  Substance Use  Topics   Alcohol use: No   Drug use: No     Allergies   Adhesive [tape], Metoprolol, Montelukast, Morphine sulfate, Penicillins, Diltiazem, Gabapentin, and Midodrine   Review of Systems Review of Systems  Constitutional:  Negative for chills and fever.  Skin:  Positive for rash. Negative for color change.   All other systems reviewed and are negative.   Physical Exam Triage Vital Signs ED Triage Vitals  Enc Vitals Group     BP      Pulse      Resp      Temp      Temp src      SpO2      Weight      Height      Head Circumference      Peak Flow      Pain Score      Pain Loc      Pain Edu?      Excl. in [place]?    No data found.  Updated Vital Signs BP (!) 141/79 (BP Location: Left Arm)   Pulse 86   Temp 98.1 F (36.7 C) (Oral)   Resp 18   LMP  (LMP Unknown)   SpO2 95%   Visual Acuity Right Eye Distance:   Left Eye Distance:   Bilateral Distance:    Right Eye Near:   Left Eye Near:    Bilateral Near:     Physical Exam Vitals and nursing note reviewed.  Constitutional:      General: She is not in acute distress.    Appearance: She is well-developed. She is not ill-appearing.  HENT:     Mouth/Throat:     Mouth: Mucous membranes are moist.  Cardiovascular:     Rate and Rhythm: Normal rate and regular rhythm.     Heart sounds: Normal heart sounds.  Pulmonary:     Effort: Pulmonary effort is normal. No respiratory distress.     Breath sounds: Normal breath sounds.  Musculoskeletal:     Cervical back: Neck supple.  Skin:    General: Skin is warm and dry.     Findings: Rash present. No erythema.     Comments: 4 pustules in scalp at top of head; no drainage or open wounds.   Neurological:     Mental Status: She is alert.  Psychiatric:        Mood and Affect: Mood normal.        Behavior: Behavior normal.     UC Treatments / Results  Labs (all labs ordered are listed, but only abnormal results are displayed) Labs Reviewed - No data to display  EKG   Radiology No results found.  Procedures Procedures (including critical care time)  Medications Ordered in UC Medications - No data to display  Initial Impression / Assessment and Plan / UC Course  I have reviewed the triage vital signs and the nursing notes.  Pertinent labs & imaging results that  were available during my care of the patient were reviewed by me and considered in my medical decision making (see chart for details).   Folliculitis.  Few small pustules in her scalp.  Treating with mupirocin ointment.  Education provided on folliculitis.  Instructed patient to follow-up with her PCP if her symptoms are not improving.  She agrees to plan of care.   Final Clinical Impressions(s) / UC Diagnoses   Final diagnoses:  Folliculitis     Discharge Instructions      Apply  the antibiotic ointment as directed.  Follow up with your primary care provider if your symptoms are not improving.         ED Prescriptions     Medication Sig Dispense Auth. Provider   mupirocin ointment (BACTROBAN) 2 % Apply 1 application topically 2 (two) times daily. 22 g Sharion Balloon, NP      PDMP not reviewed this encounter.   Sharion Balloon, NP 06/16/21 [phone]

## 2021-06-16 NOTE — ED Triage Notes (Signed)
Pt here with blistering, painful rash to left side of scalp and left shoulder blade since yesterday.

## 2021-06-16 NOTE — Telephone Encounter (Signed)
Message left on voicemail with MD response.

## 2021-06-16 NOTE — Telephone Encounter (Signed)
Left message for patient to call for MD recommendation.

## 2021-06-16 NOTE — Telephone Encounter (Signed)
Dr. B recommends patient to apply Hydrocortisone cream to areas of concern.

## 2021-06-16 NOTE — Discharge Instructions (Addendum)
Apply the antibiotic ointment as directed.  Follow up with your primary care provider if your symptoms are not improving.

## 2021-06-16 NOTE — Telephone Encounter (Signed)
Called patient reviewed all information. Thinks it may be fluid pills.  She was seen by nurse today and has developed an rash with pain and blisters on head. Patient is not able to come in for evaluation but is going to urgent care for evaluation for shingles. She will call if any questions.

## 2021-06-16 NOTE — Telephone Encounter (Signed)
Called patient for additiaonl information for request of an appointment today.  She took her son to an appointment yesterday and wore a wig.  Scalp became sore so she took the wig off and noticed little blisters on the left side of her scalp.  The blisters and soreness/tenderness is still there this morning.  D. B, please advise if patient needs assessment today.

## 2021-06-17 ENCOUNTER — Ambulatory Visit (INDEPENDENT_AMBULATORY_CARE_PROVIDER_SITE_OTHER): Payer: Medicare Other | Admitting: Podiatry

## 2021-06-17 DIAGNOSIS — M79675 Pain in left toe(s): Secondary | ICD-10-CM

## 2021-06-17 DIAGNOSIS — M79674 Pain in right toe(s): Secondary | ICD-10-CM

## 2021-06-17 DIAGNOSIS — I739 Peripheral vascular disease, unspecified: Secondary | ICD-10-CM

## 2021-06-17 DIAGNOSIS — N183 Chronic kidney disease, stage 3 unspecified: Secondary | ICD-10-CM

## 2021-06-17 DIAGNOSIS — E1122 Type 2 diabetes mellitus with diabetic chronic kidney disease: Secondary | ICD-10-CM

## 2021-06-17 DIAGNOSIS — E0843 Diabetes mellitus due to underlying condition with diabetic autonomic (poly)neuropathy: Secondary | ICD-10-CM

## 2021-06-17 DIAGNOSIS — M2010 Hallux valgus (acquired), unspecified foot: Secondary | ICD-10-CM

## 2021-06-17 DIAGNOSIS — B351 Tinea unguium: Secondary | ICD-10-CM

## 2021-06-17 NOTE — Progress Notes (Signed)
No show  Gardiner Barefoot DPM

## 2021-06-22 ENCOUNTER — Telehealth: Payer: Self-pay | Admitting: Primary Care

## 2021-06-22 NOTE — Telephone Encounter (Signed)
Called and put on schedule for tomorrow.

## 2021-06-22 NOTE — Telephone Encounter (Signed)
Patient needs an appointment

## 2021-06-22 NOTE — Telephone Encounter (Signed)
Patient is going to try to send picture in my chart let her know I will send message to you but she might still  need office visit.

## 2021-06-23 ENCOUNTER — Encounter: Payer: Self-pay | Admitting: Primary Care

## 2021-06-23 ENCOUNTER — Other Ambulatory Visit: Payer: Self-pay

## 2021-06-23 ENCOUNTER — Ambulatory Visit (INDEPENDENT_AMBULATORY_CARE_PROVIDER_SITE_OTHER)
Admission: RE | Admit: 2021-06-23 | Discharge: 2021-06-23 | Disposition: A | Discharge status: Home / Self Care | Payer: BC Managed Care – PPO | Source: Ambulatory Visit | Attending: Primary Care | Admitting: Primary Care

## 2021-06-23 ENCOUNTER — Ambulatory Visit (INDEPENDENT_AMBULATORY_CARE_PROVIDER_SITE_OTHER): Payer: BC Managed Care – PPO | Admitting: Primary Care

## 2021-06-23 VITALS — BP 132/82 | HR 92 | Temp 97.3°F | Ht 64.0 in | Wt 212.0 lb

## 2021-06-23 DIAGNOSIS — L739 Follicular disorder, unspecified: Secondary | ICD-10-CM | POA: Insufficient documentation

## 2021-06-23 DIAGNOSIS — I2510 Atherosclerotic heart disease of native coronary artery without angina pectoris: Secondary | ICD-10-CM

## 2021-06-23 DIAGNOSIS — L03012 Cellulitis of left finger: Secondary | ICD-10-CM | POA: Diagnosis not present

## 2021-06-23 DIAGNOSIS — M7989 Other specified soft tissue disorders: Secondary | ICD-10-CM | POA: Diagnosis not present

## 2021-06-23 HISTORY — DX: Cellulitis of left finger: L03.012

## 2021-06-23 MED ORDER — SULFAMETHOXAZOLE-TRIMETHOPRIM 800-160 MG PO TABS: 1.0000 | ORAL_TABLET | Freq: Two times a day (BID) | ORAL | 0 refills | Status: DC

## 2021-06-23 NOTE — Progress Notes (Signed)
Subjective:    Patient ID: [name], female    DOB: April 15, 1971, 50 y.o.   MRN: [id number]  HPI  [name] is a very pleasant 50 y.o. female with a significant medical history including CHF, CAD, COPD, chronic respiratory failure, breast cancer, recurrent skin ulcers, sleep apnea, TIA, PAD, uncontrolled type 2 diabetes who presents today to discuss joint pain and sores to her head.  Yesterday morning at 9 am she noticed a wound to the second left medial PIP joint with swelling, warmth, and pain to her entire left second digit and metacarpal joint. Since then she's noticed increased redness, swelling, pain, and warmth. She denies injury/trauma, burns.   She's also noticed several sores to the top of her head, including a very large one that has grown in size over the last week. These sores were initially noticed on 06/16/21 which prompted a visit to the [facility]. Diagnosed with folliculitis and was prescribed mupirocin cream.   Since 06/16/21 she's applied mupirocin cream twice daily and warm compresses without improvement. The sores are not itchy. No one else in her home has these sores.   No new lotions, detergents, soaps or shampoos. No new medicines, vitamins, supplements. No new pets. No recent outdoor exposure or poison ivy exposure. No bonfire or smoke exposure.  No recent motel or hotel stay or new beds.    Wt Readings from Last 3 Encounters:  06/23/21 212 lb (96.2 kg)  05/12/21 224 lb (101.6 kg)  04/14/21 230 lb (104.3 kg)      Review of Systems  Constitutional:  Positive for fever.  Musculoskeletal:  Positive for arthralgias and joint swelling.  Skin:  Positive for color change and wound.        Past Medical History:  Diagnosis Date   Anxiety    takes Prozac daily   Anxiety    Aortic valve calcification    Asthma    Advair and Spirva daily   Asthma    Bipolar disorder (HCC)    Breast cancer, right breast ([place]) 12/23/2019   CAD (coronary  artery disease)    a. LHC 11/2013 done for CP/fluid retention: mild disease in prox LAD, mild-mod disease in mRCA, EF 60% with normal LVEDP. b. Normal nuc 03/2016.   Cancer ([place])    Cellulitis and abscess of trunk 03/12/2020   CHF (congestive heart failure) (HCC)    Chronic diastolic CHF (congestive heart failure) (HCC)    Chronic heart failure with preserved ejection fraction ([place]) 11/16/2018   CKD (chronic kidney disease), stage II    COPD (chronic obstructive pulmonary disease) (HCC)    a. nocturnal O2.   COPD (chronic obstructive pulmonary disease) (HCC)    Coronary artery disease    Decreased urine stream    Diabetes mellitus    Diabetes mellitus without complication (HCC)    Dyspnea    Elevated C-reactive protein (CRP) 01/29/2018   Elevated sedimentation rate 01/29/2018   Elevated serum hCG 01/19/2018   Family history of adverse reaction to anesthesia    mom gets nauseated   Foot pain, bilateral 05/22/2018   GERD (gastroesophageal reflux disease)    takes Pepcid daily   History of blood clots    left leg 3-37yr ago   Hyperlipidemia    Hypertension    Hypertriglyceridemia    Inguinal hernia, left 01/2015   Muscle spasm    Open wound of genital labia    Peripheral neuropathy    RBBB    Seizures (  [place])    Sepsis ([place]) 01/19/2018   Sinus tachycardia    a. persistent since 2009.   Smokers' cough ([place])    Status post mastectomy, right 01/14/2020   Stroke [facility]) 1989   left sided weakness   TIA (transient ischemic attack)    Tobacco abuse    Vulvar abscess 01/23/2018    Social History   Socioeconomic History   Marital status: Married    Spouse name: Not on file   Number of children: 2   Years of education: Not on file   Highest education level: Not on file  Occupational History   Not on file  Tobacco Use   Smoking status: Some Days    Packs/day: 0.25    Years: 36.00    Pack years: 9.00    Types: Cigarettes    Start date: 07/11/1981   Smokeless tobacco: Never   Tobacco  comments:    1 ppd now  Vaping Use   Vaping Use: Some days  Substance and Sexual Activity   Alcohol use: No   Drug use: No   Sexual activity: Not Currently  Other Topics Concern   Not on file  Social History Narrative   ** Merged History Encounter **       Lives in [place]   Married but lives with Boyfriend - not legally separated   Disabled - for BiPolar, Seizure disorder, diabetes   Formerly worked at [employer] 1 ppd; no alcohol. 2 step sons; no biologic children.       Social Determinants of Radio broadcast assistant Strain: Not on file  Food Insecurity: No Food Insecurity   Worried About Charity fundraiser in the Last Year: Never true   Ran Out of Food in the Last Year: Never true  Transportation Needs: Not on file  Physical Activity: Not on file  Stress: Not on file  Social Connections: Not on file  Intimate Partner Violence: Not on file    Past Surgical History:  Procedure Laterality Date   APPLICATION OF WOUND VAC Right 01/29/2020   Procedure: APPLICATION OF WOUND VAC;  Surgeon: Ronny Bacon, MD;  Location: [facility];  Service: General;  Laterality: Right;   BREAST BIOPSY Right 11/06/2019   [place] core path pending venus clip   HERNIA REPAIR Left    INCISION AND DRAINAGE ABSCESS N/A 01/26/2018   Procedure: INCISION AND DEBRIDEMENT OF VULVAR NECROTIZING SOFT TISSUE INFECTION;  Surgeon: Greer Pickerel, MD;  Location: [place];  Service: General;  Laterality: N/A;   INCISION AND DRAINAGE PERIRECTAL ABSCESS N/A 01/22/2018   Procedure: IRRIGATION AND DEBRIDEMENT LABIAL/VULVAR AREA;  Surgeon: Coralie Keens, MD;  Location: [place];  Service: General;  Laterality: N/A;   INCISION AND DRAINAGE PERIRECTAL ABSCESS N/A 01/29/2018   Procedure: IRRIGATION AND DEBRIDEMENT VULVA;  Surgeon: Excell Seltzer, MD;  Location: [place];  Service: General;  Laterality: N/A;   INGUINAL HERNIA REPAIR Left 04/08/2015   Procedure: OPEN LEFT INGUINAL HERNIA REPAIR WITH MESH;  Surgeon:  Ralene Ok, MD;  Location: [place];  Service: General;  Laterality: Left;   INSERTION OF MESH Left 04/08/2015   Procedure: INSERTION OF MESH;  Surgeon: Ralene Ok, MD;  Location: [place];  Service: General;  Laterality: Left;   LAPAROSCOPY     Endometriosis   LEFT HEART CATHETERIZATION WITH CORONARY ANGIOGRAM N/A 12/05/2013   Procedure: LEFT HEART CATHETERIZATION WITH CORONARY ANGIOGRAM;  Surgeon: Jettie Booze, MD;  Location: [facility] C[facility];  Service: Cardiovascular;  Laterality: N/A;   MASTECTOMY Right    right kidney drained     SIMPLE MASTECTOMY WITH AXILLARY SENTINEL NODE BIOPSY Right 12/23/2019   Procedure: Right SIMPLE MASTECTOMY WITH AXILLARY SENTINEL NODE BIOPSY;  Surgeon: Ronny Bacon, MD;  Location: [facility];  Service: General;  Laterality: Right;   TEE WITHOUT CARDIOVERSION N/A 11/28/2013   Procedure: TRANSESOPHAGEAL ECHOCARDIOGRAM (TEE);  Surgeon: Thayer Headings, MD;  Location: [place];  Service: Cardiovascular;  Laterality: N/A;   WOUND DEBRIDEMENT Right 01/29/2020   Procedure: DEBRIDEMENT WOUND, Excisional debridement skin, subcutaneous and muscle right chest wall;  Surgeon: Ronny Bacon, MD;  Location: [facility];  Service: General;  Laterality: Right;    Family History  Problem Relation Age of Onset   Venous thrombosis Brother    Other Brother        BRAIN TUMOR   Asthma Father    Diabetes Father    Coronary artery disease Mother    Hypertension Mother    Diabetes Mother    Breast cancer Mother 67   Asthma Sister    Diabetes type II Brother    Diabetes Brother     Allergies  Allergen Reactions   Adhesive [Tape] Rash and Other (See Comments)    TAKES OFF THE SKIN (CERTAIN MEDICAL TAPES DO THIS!!)   Metoprolol Shortness Of Breath    Occurrence of shortness of breath after 3 days   Montelukast Shortness Of Breath   Morphine Sulfate Anaphylaxis, Shortness Of Breath and Nausea And Vomiting    Swollen Throat - Able to tolerate dilaudid    Penicillins Anaphylaxis, Hives and Shortness Of Breath    Throat swells Has patient had a PCN reaction causing immediate rash, facial/tongue/throat swelling, SOB or lightheadedness with hypotension: Yes Has patient had a PCN reaction causing severe rash involving mucus membranes or skin necrosis: No Has patient had a PCN reaction that required hospitalization: Yes Has patient had a PCN reaction occurring within the last 10 years: No If all of the above answers are "NO", then may proceed with Cephalosporin use.    Diltiazem Swelling   Gabapentin Swelling   Midodrine     Lightheaded and falling down    Current Outpatient Medications on File Prior to Visit  Medication Sig Dispense Refill   albuterol (VENTOLIN HFA) 108 (90 Base) MCG/ACT inhaler Inhale 2 puffs into the lungs every 6 (six) hours as needed for wheezing or shortness of breath.     ALPRAZolam (XANAX) 1 MG tablet Take 1 mg by mouth 4 (four) times daily as needed for anxiety.      anastrozole (ARIMIDEX) 1 MG tablet Take 1 tablet (1 mg total) by mouth daily. 30 tablet 3   aspirin EC 81 MG tablet Take 81 mg by mouth daily before breakfast.     blood glucose meter kit and supplies Use up to four times daily as directed. (FOR ICD-10 E10.9, E11.9). 1 each 0   budesonide-formoterol (SYMBICORT) 80-4.5 MCG/ACT inhaler INHALE 2 PUFFS BY MOUTH EVERY 12 HOURS TO PREVENT COUGH OR WHEEZING *RINSE MOUTH AFTER EACH USE* 10.2 g 2   busPIRone (BUSPAR) 30 MG tablet Take 30 mg by mouth 2 (two) times daily.     desvenlafaxine (PRISTIQ) 100 MG 24 hr tablet Take 100 mg by mouth daily.     diclofenac Sodium (VOLTAREN) 1 % GEL Apply 2 g topically 3 (three) times daily as needed. For pain. 100 g 0   EYLEA 2 MG/0.05ML SOSY  FLUoxetine (PROZAC) 40 MG capsule Take 40 mg by mouth daily.     glucose blood test strip Use as instructed 300 each 0   Insulin Pen Needle 32G X 4 MM MISC Used to give insulin injections twice daily. 100 each 11   insulin regular  human CONCENTRATED (HUMULIN R) 500 UNIT/ML injection Inject 30 Units into the skin 3 (three) times daily with meals. 30 U BID     OXYGEN Inhale 2-4 L into the lungs 2 (two) times daily as needed (shortness of breath).     pregabalin (LYRICA) 300 MG capsule Take 1 capsule (300 mg total) by mouth 2 (two) times daily. For nerve pain. 180 capsule 0   ReliOn Ultra Thin Lancets 30G MISC Use as directed for blood sugar checks. 300 each 0   rOPINIRole (REQUIP) 1 MG tablet TAKE 1 TABLET BY MOUTH EVERY DAY AT BEDTIME FOR  RESTLESS  LEG 90 tablet 0   rosuvastatin (CRESTOR) 20 MG tablet Take 1 tablet (20 mg total) by mouth daily. For cholesterol. 90 tablet 3   spironolactone (ALDACTONE) 50 MG tablet Take 50 mg by mouth daily.     topiramate (TOPAMAX) 50 MG tablet Take 1 tablet (50 mg total) by mouth 2 (two) times daily. For headache prevention. 180 tablet 1   traZODone (DESYREL) 100 MG tablet Take 200 mg by mouth at bedtime.     UBRELVY 50 MG TABS Take by mouth.     ULTICARE MINI PEN NEEDLES 31G X 6 MM MISC USE AS DIRECTED 3 TIMES DAILY 100 each 0   citalopram (CELEXA) 20 MG tablet Take 1 tablet (20 mg total) by mouth daily. (Patient not taking: Reported on 06/23/2021) 30 tablet 2   Desvenlafaxine ER 100 MG TB24 Take 1 tablet by mouth every morning. (Patient not taking: Reported on 06/23/2021)     Empagliflozin-metFORMIN HCl ER 25-1000 MG TB24 Take 1 tablet by mouth daily with breakfast. (Patient not taking: Reported on 06/23/2021)     Insulin Syringe-Needle U-100 (GLOBAL INJECT EASE INSULIN SYR) 30G X 1/2" 1 ML MISC See admin instructions.     Lido-Capsaicin-Men-Methyl Sal 0.5-0.035-5-20 % PTCH Apply 1 patch to the skin every 8 hours as needed for pain. Rotate sites of patch. (Patient not taking: Reported on 06/23/2021) 30 patch 0   methocarbamol (ROBAXIN) 500 MG tablet Take 1 tablet (500 mg total) by mouth 2 (two) times daily as needed for muscle spasms. (Patient not taking: Reported on 06/23/2021) 10 tablet 0    mupirocin ointment (BACTROBAN) 2 % Apply 1 application topically 2 (two) times daily. Apply to the affected area 2 times a day (Patient not taking: Reported on 06/23/2021) 22 g 3   mupirocin ointment (BACTROBAN) 2 % Apply 1 application topically 2 (two) times daily. (Patient not taking: Reported on 06/23/2021) 22 g 0   tobramycin (TOBREX) 0.3 % ophthalmic solution 1 drop 4 (four) times daily. (Patient not taking: Reported on 06/23/2021)     No current facility-administered medications on file prior to visit.    BP 132/82    Pulse 92    Temp (!) 97.3 F (36.3 C) (Temporal)    Ht 5' 4"  (1.626 m)    Wt 212 lb (96.2 kg)    LMP  (LMP Unknown)    SpO2 96%    BMI 36.39 kg/m  Objective:   Physical Exam Constitutional:      General: She is not in acute distress.    Appearance: She is not ill-appearing.  Pulmonary:     Effort: Pulmonary effort is normal.  Skin:    Findings: Erythema present.     Comments: Moderate swelling with erythema and warmth to entire left second digit. Open circular, wound measuring 0.5 cm to left medial PIP joint. Non fluctuant.   2 cm round, non fluctuant, raised wound to top of right parietal head. Hair follicle noted to be coming from opening. Erythema.             Assessment & Plan:      This visit occurred during the SARS-CoV-2 public health emergency.  Safety protocols were in place, including screening questions prior to the visit, additional usage of staff PPE, and extensive cleaning of exam room while observing appropriate contact time as indicated for disinfecting solutions.

## 2021-06-23 NOTE — Assessment & Plan Note (Signed)
Cannot rule out septic joint.   Checking CBC with diff, uric acid today. Plain films of the left hand ordered and pending.  Stat referral placed to orthopedics for evaluation.  Rx for Bactrim DS tablets BID provided.  Allergy to PCN, and given this I am reticent to provide ceftriaxone IM and send her home.   Strict ED precautions provided.

## 2021-06-23 NOTE — Assessment & Plan Note (Signed)
With infection.  Rx for Bactrim DS tablets provided today. Discussed home care instructions.

## 2021-06-23 NOTE — Patient Instructions (Addendum)
Start Bactrim DS (sulfamethoxazole/trimethoprim) tablets for the infection. Take 1 tablet by mouth twice daily for 10 days.  You will receive a phone call from the orthopedic doctor's office today. Call them tomorrow if you don't hear from them today.  Go to the hospital if this gets worse.  It was a pleasure to see you today!

## 2021-06-24 ENCOUNTER — Ambulatory Visit (INDEPENDENT_AMBULATORY_CARE_PROVIDER_SITE_OTHER): Payer: BC Managed Care – PPO | Admitting: *Deleted

## 2021-06-24 ENCOUNTER — Other Ambulatory Visit: Payer: Self-pay | Admitting: Primary Care

## 2021-06-24 ENCOUNTER — Telehealth: Payer: Self-pay | Admitting: Primary Care

## 2021-06-24 DIAGNOSIS — E119 Type 2 diabetes mellitus without complications: Secondary | ICD-10-CM

## 2021-06-24 DIAGNOSIS — L03012 Cellulitis of left finger: Secondary | ICD-10-CM

## 2021-06-24 DIAGNOSIS — I5032 Chronic diastolic (congestive) heart failure: Secondary | ICD-10-CM

## 2021-06-24 DIAGNOSIS — J449 Chronic obstructive pulmonary disease, unspecified: Secondary | ICD-10-CM

## 2021-06-24 DIAGNOSIS — J4489 Other specified chronic obstructive pulmonary disease: Secondary | ICD-10-CM

## 2021-06-24 DIAGNOSIS — F3131 Bipolar disorder, current episode depressed, mild: Secondary | ICD-10-CM

## 2021-06-24 LAB — CBC WITH DIFFERENTIAL/PLATELET
Basophils Absolute: 0.1 10*3/uL (ref 0.0–0.1)
Basophils Relative: 0.8 % (ref 0.0–3.0)
Eosinophils Absolute: 0.2 10*3/uL (ref 0.0–0.7)
Eosinophils Relative: 1.6 % (ref 0.0–5.0)
HCT: 41 % (ref 36.0–46.0)
Hemoglobin: 13.9 g/dL (ref 12.0–15.0)
Lymphocytes Relative: 9.4 % — ABNORMAL LOW (ref 12.0–46.0)
Lymphs Abs: 1.2 10*3/uL (ref 0.7–4.0)
MCHC: 34 g/dL (ref 30.0–36.0)
MCV: 84.7 fl (ref 78.0–100.0)
Monocytes Absolute: 0.8 10*3/uL (ref 0.1–1.0)
Monocytes Relative: 6 % (ref 3.0–12.0)
Neutro Abs: 10.8 10*3/uL — ABNORMAL HIGH (ref 1.4–7.7)
Neutrophils Relative %: 82.2 % — ABNORMAL HIGH (ref 43.0–77.0)
Platelets: 197 10*3/uL (ref 150.0–400.0)
RBC: 4.84 Mil/uL (ref 3.87–5.11)
RDW: 16.1 % — ABNORMAL HIGH (ref 11.5–15.5)
WBC: 13.1 10*3/uL — ABNORMAL HIGH (ref 4.0–10.5)

## 2021-06-24 LAB — URIC ACID: Uric Acid, Serum: 6 mg/dL (ref 2.4–7.0)

## 2021-06-24 MED ORDER — TRAMADOL HCL 50 MG PO TABS: 50.0000 mg | ORAL_TABLET | Freq: Three times a day (TID) | ORAL | 0 refills | Status: DC | PRN

## 2021-06-24 NOTE — Chronic Care Management (AMB) (Signed)
°Chronic Care Management  ° ° Clinical Social Work Note ° °06/24/2021 °Name: [name] MRN: [id number] DOB: 02/19/1971 ° °[name] is a 50 y.o. year old female who is a primary care patient of Clark, Katherine K, NP. The CCM team was consulted to assist the patient with chronic disease management and/or care coordination needs related to: Community Resources .  ° °Engaged with patient by telephone for follow up visit in response to provider referral for social work chronic care management and care coordination services.  ° °Consent to Services:  °The patient was given information about Chronic Care Management services, agreed to services, and gave verbal consent prior to initiation of services.  Please see initial visit note for detailed documentation.  ° °Patient agreed to services and consent obtained.  ° °Assessment: Review of patient past medical history, allergies, medications, and health status, including review of relevant consultants reports was performed today as part of a comprehensive evaluation and provision of chronic care management and care coordination services.    ° °SDOH (Social Determinants of Health) assessments and interventions performed:   ° °Advanced Directives Status:  suggested she ask Hospice to assist with this- also advised pt [facility] ° °CCM Care Plan ° °Allergies  °Allergen Reactions  ° Adhesive [Tape] Rash and Other (See Comments)  °  TAKES OFF THE SKIN (CERTAIN MEDICAL TAPES DO THIS!!)  ° Metoprolol Shortness Of Breath  °  Occurrence of shortness of breath after 3 days  ° Montelukast Shortness Of Breath  ° Morphine Sulfate Anaphylaxis, Shortness Of Breath and Nausea And Vomiting  °  Swollen Throat - Able to tolerate dilaudid  ° Penicillins Anaphylaxis, Hives and Shortness Of Breath  °  Throat swells °Has patient had a PCN reaction causing immediate rash, facial/tongue/throat swelling, SOB or lightheadedness with hypotension: Yes °Has patient had a  PCN reaction causing severe rash involving mucus membranes or skin necrosis: No °Has patient had a PCN reaction that required hospitalization: Yes °Has patient had a PCN reaction occurring within the last 10 years: No °If all of the above answers are "NO", then may proceed with Cephalosporin use. °  ° Diltiazem Swelling  ° Gabapentin Swelling  ° Midodrine   °  Lightheaded and falling down  ° ° °Outpatient Encounter Medications as of 06/24/2021  °Medication Sig Note  ° albuterol (VENTOLIN HFA) 108 (90 Base) MCG/ACT inhaler Inhale 2 puffs into the lungs every 6 (six) hours as needed for wheezing or shortness of breath.   ° ALPRAZolam (XANAX) 1 MG tablet Take 1 mg by mouth 4 (four) times daily as needed for anxiety.    ° anastrozole (ARIMIDEX) 1 MG tablet Take 1 tablet (1 mg total) by mouth daily.   ° aspirin EC 81 MG tablet Take 81 mg by mouth daily before breakfast.   ° blood glucose meter kit and supplies Use up to four times daily as directed. (FOR ICD-10 E10.9, E11.9).   ° budesonide-formoterol (SYMBICORT) 80-4.5 MCG/ACT inhaler INHALE 2 PUFFS BY MOUTH EVERY 12 HOURS TO PREVENT COUGH OR WHEEZING *RINSE MOUTH AFTER EACH USE*   ° busPIRone (BUSPAR) 30 MG tablet Take 30 mg by mouth 2 (two) times daily.   ° citalopram (CELEXA) 20 MG tablet Take 1 tablet (20 mg total) by mouth daily. (Patient not taking: Reported on 06/23/2021) 02/15/2021: Patient states she takes 2 per day.  ° desvenlafaxine (PRISTIQ) 100 MG 24 hr tablet Take 100 mg by mouth daily.   °   Desvenlafaxine ER 100 MG TB24 Take 1 tablet by mouth every morning. (Patient not taking: Reported on 06/23/2021)   ° diclofenac Sodium (VOLTAREN) 1 % GEL Apply 2 g topically 3 (three) times daily as needed. For pain.   ° Empagliflozin-metFORMIN HCl ER 25-1000 MG TB24 Take 1 tablet by mouth daily with breakfast. (Patient not taking: Reported on 06/23/2021)   ° EYLEA 2 MG/0.05ML SOSY    ° FLUoxetine (PROZAC) 40 MG capsule Take 40 mg by mouth daily.   ° glucose blood test  strip Use as instructed   ° Insulin Pen Needle 32G X 4 MM MISC Used to give insulin injections twice daily.   ° insulin regular human CONCENTRATED (HUMULIN R) 500 UNIT/ML injection Inject 30 Units into the skin 3 (three) times daily with meals. 30 U BID   ° Insulin Syringe-Needle U-100 (GLOBAL INJECT EASE INSULIN SYR) 30G X 1/2" 1 ML MISC See admin instructions.   ° Lido-Capsaicin-Men-Methyl Sal 0.5-0.035-5-20 % PTCH Apply 1 patch to the skin every 8 hours as needed for pain. Rotate sites of patch. (Patient not taking: Reported on 06/23/2021)   ° methocarbamol (ROBAXIN) 500 MG tablet Take 1 tablet (500 mg total) by mouth 2 (two) times daily as needed for muscle spasms. (Patient not taking: Reported on 06/23/2021)   ° mupirocin ointment (BACTROBAN) 2 % Apply 1 application topically 2 (two) times daily. Apply to the affected area 2 times a day (Patient not taking: Reported on 06/23/2021)   ° mupirocin ointment (BACTROBAN) 2 % Apply 1 application topically 2 (two) times daily. (Patient not taking: Reported on 06/23/2021)   ° OXYGEN Inhale 2-4 L into the lungs 2 (two) times daily as needed (shortness of breath).   ° pregabalin (LYRICA) 300 MG capsule Take 1 capsule (300 mg total) by mouth 2 (two) times daily. For nerve pain.   ° ReliOn Ultra Thin Lancets 30G MISC Use as directed for blood sugar checks.   ° rOPINIRole (REQUIP) 1 MG tablet TAKE 1 TABLET BY MOUTH EVERY DAY AT BEDTIME FOR  RESTLESS  LEG   ° rosuvastatin (CRESTOR) 20 MG tablet Take 1 tablet (20 mg total) by mouth daily. For cholesterol.   ° spironolactone (ALDACTONE) 50 MG tablet Take 50 mg by mouth daily.   ° sulfamethoxazole-trimethoprim (BACTRIM DS) 800-160 MG tablet Take 1 tablet by mouth 2 (two) times daily.   ° tobramycin (TOBREX) 0.3 % ophthalmic solution 1 drop 4 (four) times daily. (Patient not taking: Reported on 06/23/2021)   ° topiramate (TOPAMAX) 50 MG tablet Take 1 tablet (50 mg total) by mouth 2 (two) times daily. For headache prevention.   °  traZODone (DESYREL) 100 MG tablet Take 200 mg by mouth at bedtime.   ° UBRELVY 50 MG TABS Take by mouth.   ° ULTICARE MINI PEN NEEDLES 31G X 6 MM MISC USE AS DIRECTED 3 TIMES DAILY   ° °No facility-administered encounter medications on file as of 06/24/2021.  ° ° °Patient Active Problem List  ° Diagnosis Date Noted  ° Cellulitis of finger of left hand 06/23/2021  ° Folliculitis 06/23/2021  ° Headache disorder 03/08/2021  ° Dysuria 03/05/2021  ° Pressure sore 01/29/2021  ° Hyperlipidemia associated with type 2 diabetes mellitus (HCC) 01/13/2021  ° Fatigue 12/01/2020  ° Acute on chronic diastolic heart failure (HCC) 11/16/2020  ° Chronic migraine without aura without status migrainosus, not intractable 10/01/2020  ° Dizziness 10/01/2020  ° Microscopic hematuria 07/01/2020  ° Pulmonary nodules 07/01/2020  ° Hav (hallux   abducto valgus), unspecified laterality 06/11/2020   Venous ulcer of ankle, left ([place]) 05/15/2020   Contracture of hand joint 05/15/2020   Acute kidney injury superimposed on CKD [facility])    Hyperlipidemia    Carcinoma of lower-outer quadrant of right breast in female, estrogen receptor positive ([place]) 11/12/2019   Restrictive lung disease 10/08/2019   Postmenopausal bleeding 08/30/2019   Carotid stenosis, asymptomatic, right 08/16/2019   Nausea and vomiting 07/15/2019   Cellulitis of left lower extremity 06/04/2019   Multiple wounds of skin 06/04/2019   Other injury of unspecified body region, initial encounter 06/04/2019   (HFpEF) heart failure with preserved ejection fraction ([place]) 11/16/2018   Left-sided weakness 09/13/2018   Weakness of left lower extremity 09/05/2018   Recurrent falls 09/05/2018   PAD (peripheral artery disease) ([place]) 08/27/2018   Chronic congestive heart failure ([place]) 08/02/2018   Vaginal itching 06/15/2018   Chronic upper extremity pain (Secondary Area of Pain) (Bilateral) 04/23/2018   Diabetic polyneuropathy associated with type 2 diabetes mellitus ([place])  04/23/2018   Long term prescription benzodiazepine use 04/23/2018   Neurogenic pain 04/23/2018   Vitamin D insufficiency 01/29/2018   Insulin dependent type 2 diabetes mellitus ([place]) 01/23/2018   DNR (do not resuscitate) 01/23/2018   CKD stage 3 due to type 2 diabetes mellitus ([place]) 01/23/2018   IBS (irritable bowel syndrome) 01/19/2018   Chronic lower extremity pain (Primary Area of Pain) (Bilateral) 01/08/2018   Chronic low back pain [facility] Area of Pain) (Bilateral) w/ sciatica (Bilateral) 01/08/2018   Chronic pain syndrome 01/08/2018   Pharmacologic therapy 01/08/2018   Disorder of skeletal system 01/08/2018   Problems influencing health status 01/08/2018   Long term current use of opiate analgesic 01/08/2018   Restless leg syndrome 08/02/2017   Nocturnal hypoxemia 03/29/2017   Vision disturbance 02/28/2017   CKD (chronic kidney disease), stage II 02/28/2017   Visual loss, bilateral    Chronic respiratory failure with hypoxia (HCC)/ nocturnal 02 dep  10/28/2016   Upper airway cough syndrome 08/22/2016   Cigarette smoker 06/15/2016   Simple chronic bronchitis ([place])    Coronary artery disease involving native heart without angina pectoris 05/16/2016   Chronic diastolic CHF (congestive heart failure) ([place]) 11/04/2015   Orthopnea 11/03/2015   Bilateral leg edema    Diabetes ([place])    COPD exacerbation ([place]) 10/15/2015   Fracture of rib, closed 10/15/2015   Venous stasis of both lower extremities 03/29/2015   Preventative health care 02/04/2015   Hypertension associated with diabetes ([place]) 01/02/2015   Inguinal hernia 11/27/2014   Allergic rhinitis with postnasal drip 01/21/2014   Aortic valve disorders 04/18/2013   Sleep apnea 05/22/2012   Syncope and collapse 10/09/2011   Seizure disorder ([place]) 10/09/2011   Bipolar disorder ([place]) 10/09/2011   TIA (transient ischemic attack) 06/22/2011   Constipation 06/15/2011   HYPERTRIGLYCERIDEMIA 07/17/2007   Situational anxiety  05/30/2007   COPD with chronic bronchitis ([place]) 05/30/2007   Morbid (severe) obesity due to excess calories ([place]) 04/23/2007   Tobacco abuse 09/07/2006    Conditions to be addressed/monitored: Depression; Limited social support and Mental Health Concerns   Care Plan : LCSW Plan of Care  Updates made by Deirdre Peer, LCSW since 06/24/2021 12:00 AM     Problem: Stress, anxiety and depression   Priority: High     Long-Range Goal: Provide resources and support to reduce symptoms (anxiety/depression) Completed 06/24/2021  Start Date: 04/26/2021  Expected End Date: 07/09/2021  Recent Progress: On track  Priority: High  Note:   °Current Barriers:  °Chronic Mental Health needs related to medical condition, history of Bipolar D/O, anxiety °Limited social support, Mental Health Concerns , and Lacks knowledge of community resource:   °Suicidal Ideation/Homicidal Ideation: No ° °Clinical Social Work Goal(s):  °patient will work with SW  PRN  by telephone to reduce or manage symptoms related to mental health needs °demonstrate a reduction in symptoms related to :Anxiety with Excessive Worry, °Panic Symptoms,, Bipolar Disorder, and anxiety  ° °Interventions: °06/24/21- pt reports she is active with Hospice- advised pt of plans to sign off at this time. Reminded her that Hospice can provide social work support and other discipline care and support. Pt also reminded to call 911 (medical) or [phone](mental health)  emergency arises.  ° She also has been linked with counseling support if she decides to proceed.   She shared that she got a rash/burn at chemo because she wore her wig-  °Offered support and encouragement ° °Patient interviewed and appropriate assessments performed: brief mental health assessment °1:1 collaboration with Clark, Katherine K, NP regarding development and update of comprehensive plan of care as evidenced by provider attestation and co-signature °Patient interviewed and appropriate  assessments performed °Referred patient to [facility] (mental health provider) for long term follow up and therapy/counseling °Solution-Focused Strategies  °Active listening / Reflection utilized  °Emotional Support Provided °Problem Solving /Task Center strategies reviewed °Participation in counseling encouraged  ° ° °Patient Self Care Activities:  °Self administers medications as prescribed °Attends all scheduled provider appointments °Calls pharmacy for medication refills °Calls provider office for new concerns or questions °Motivation for treatment °Strong family or social support ° °Patient Coping Strengths:  °Supportive Relationships °Family °Hopefulness ° °Patient Self Care Deficits:  °Needs help getting linked with support services/counseling ° °Patient Goals:  °call [facility] to arrange in-person counseling °- avoid negative self-talk °- develop a personal safety plan °- have a plan for how to handle bad days °- spend time or talk with others at least 2 to 3 times per week °- watch for early signs of feeling worse °- write in journal every day °- begin personal counseling °- check out volunteer opportunities °- learn and use visualization or guided imagery °- call to cancel if needed °- keep a calendar with prescription refill dates °- keep a calendar with appointment dates ° °Follow Up Plan:  Contact counseling for appointment when/if you want.  ° °  °  ° °Follow Up Plan: Client will reach out if further needs arise. °  MSW, LCSW °Licensed Clinical Social Worker °[facility] °[phone]  ° ° °

## 2021-06-24 NOTE — Telephone Encounter (Signed)
See lab note for documentation. I have spoke to patient and sent message to [facility] for review.

## 2021-06-24 NOTE — Telephone Encounter (Signed)
Pt returned call would like a call back . Stated she as medical questions and concern would like xray result .Please Advise [phone]

## 2021-06-24 NOTE — Telephone Encounter (Signed)
Pt requesting call back from CMA about yesterdays appt. Callback [phone]

## 2021-06-24 NOTE — Patient Instructions (Signed)
Visit Information  Thank you for taking time to visit with me today. Please don't hesitate to contact me if I can be of assistance to you .   If you are experiencing a Mental Health or [place] or need someone to talk to, please call the Suicide and Crisis Lifeline: 988 call the [place] National Suicide Prevention Lifeline: 830-872-6128 or TTY: 814-856-6982 TTY 302-783-0993) to talk to a trained counselor call 911 Call 988 for mental health crisis    The patient verbalized understanding of instructions, educational materials, and care plan provided today and declined offer to receive copy of patient instructions, educational materials, and care plan.   Eduard Clos MSW, LCSW Licensed Clinical Social Worker [place] [phone]

## 2021-06-24 NOTE — Telephone Encounter (Signed)
Left message to return call to our office.  

## 2021-06-25 ENCOUNTER — Ambulatory Visit: Payer: Medicare Other | Admitting: Primary Care

## 2021-06-25 DIAGNOSIS — J449 Chronic obstructive pulmonary disease, unspecified: Secondary | ICD-10-CM | POA: Diagnosis not present

## 2021-06-26 DIAGNOSIS — J449 Chronic obstructive pulmonary disease, unspecified: Secondary | ICD-10-CM | POA: Diagnosis not present

## 2021-06-29 ENCOUNTER — Ambulatory Visit (INDEPENDENT_AMBULATORY_CARE_PROVIDER_SITE_OTHER): Payer: BC Managed Care – PPO | Admitting: Primary Care

## 2021-06-29 ENCOUNTER — Encounter: Payer: Self-pay | Admitting: Primary Care

## 2021-06-29 ENCOUNTER — Other Ambulatory Visit: Payer: Self-pay

## 2021-06-29 DIAGNOSIS — L03012 Cellulitis of left finger: Secondary | ICD-10-CM | POA: Diagnosis not present

## 2021-06-29 DIAGNOSIS — L739 Follicular disorder, unspecified: Secondary | ICD-10-CM

## 2021-06-29 DIAGNOSIS — I2510 Atherosclerotic heart disease of native coronary artery without angina pectoris: Secondary | ICD-10-CM

## 2021-06-29 NOTE — Patient Instructions (Signed)
Continue taking the antibiotics as discussed.  We will see you on Friday this week.  It was a pleasure to see you today!

## 2021-06-29 NOTE — Progress Notes (Signed)
Subjective:    Patient ID: [name], female    DOB: [birth year], 50 y.o.   MRN: [id number]  HPI  Katyra Tomassetti is a very pleasant 50 y.o. female with a significant medical history including uncontrolled type 2 diabetes, chronic respiratory failure, breast cancer, COPD, skin ulcers, OSA, CHF who presents today for follow up of cellulitis and folliculitis.  She was last evaluated on 06/23/21, noted to have significant swelling to left second medial PIP joint with warmth and erythema. Also with sores to her head with a larger one to the posterior parietal lobe concerning for infected folliculitis. Xray of left hand negative for obvious osteomyelitis. Treated with Bactrim DS tablets and referred to hand surgery.   Today she is here for follow up. Since her last visit she is compliant to her Bactrim DS antibiotics. Her left finger has improved. The wound to her left medial finger "popped" a few days ago with "mustard discharge" and has felt better since.   She continues to notice pain to the folliculitis to her head, is wanting relief from the pain. Her husband and son have not noticed an increase in swelling. She is requesting the site be lanced due to her pain.   Review of Systems  Constitutional:  Negative for fever.  Musculoskeletal:  Positive for arthralgias.  Skin:  Positive for color change and wound.        Past Medical History:  Diagnosis Date   Anxiety    takes Prozac daily   Anxiety    Aortic valve calcification    Asthma    Advair and Spirva daily   Asthma    Bipolar disorder (HCC)    Breast cancer, right breast ([place]) 12/23/2019   CAD (coronary artery disease)    a. LHC 11/2013 done for CP/fluid retention: mild disease in prox LAD, mild-mod disease in mRCA, EF 60% with normal LVEDP. b. Normal nuc 03/2016.   Cancer ([place])    Cellulitis and abscess of trunk 03/12/2020   CHF (congestive heart failure) (HCC)    Chronic diastolic CHF (congestive heart failure) (HCC)     Chronic heart failure with preserved ejection fraction ([place]) 11/16/2018   CKD (chronic kidney disease), stage II    COPD (chronic obstructive pulmonary disease) (HCC)    a. nocturnal O2.   COPD (chronic obstructive pulmonary disease) (HCC)    Coronary artery disease    Decreased urine stream    Diabetes mellitus    Diabetes mellitus without complication (HCC)    Dyspnea    Elevated C-reactive protein (CRP) 01/29/2018   Elevated sedimentation rate 01/29/2018   Elevated serum hCG 01/19/2018   Family history of adverse reaction to anesthesia    mom gets nauseated   Foot pain, bilateral 05/22/2018   GERD (gastroesophageal reflux disease)    takes Pepcid daily   History of blood clots    left leg 3-41yr ago   Hyperlipidemia    Hypertension    Hypertriglyceridemia    Inguinal hernia, left 01/2015   Muscle spasm    Open wound of genital labia    Peripheral neuropathy    RBBB    [place] 01/19/[place]   Sinus tachycardia    a. persistent since 2009.   Smokers' cough ([facility]    Status post mastectomy, right 01/14/2020   Stroke ([facility] 1989   left sided weakness   TIA (transient ischemic attack)    Tobacco abuse    Vulvar abscess 01/23/2018  Social History   Socioeconomic History   Marital status: Married    Spouse name: Not on file   Number of children: 2   Years of education: Not on file   Highest education level: Not on file  Occupational History   Not on file  Tobacco Use   Smoking status: Some Days    Packs/day: 0.25    Years: 36.00    Pack years: 9.00    Types: Cigarettes    Start date: 07/11/1981   Smokeless tobacco: Never   Tobacco comments:    1 ppd now  Vaping Use   Vaping Use: Some days  Substance and Sexual Activity   Alcohol use: No   Drug use: No   Sexual activity: Not Currently  Other Topics Concern   Not on file  Social History Narrative   ** Merged History Encounter **       Lives in [place]   Married but lives with Boyfriend -  not legally separated   Disabled - for BiPolar, Seizure disorder, diabetes   Formerly worked at [employer] 1 ppd; no alcohol. 2 step sons; no biologic children.       Social Determinants of Radio broadcast assistant Strain: Not on file  Food Insecurity: No Food Insecurity   Worried About Charity fundraiser in the Last Year: Never true   Ran Out of Food in the Last Year: Never true  Transportation Needs: Not on file  Physical Activity: Not on file  Stress: Not on file  Social Connections: Not on file  Intimate Partner Violence: Not on file    Past Surgical History:  Procedure Laterality Date   APPLICATION OF WOUND VAC Right 01/29/2020   Procedure: APPLICATION OF WOUND VAC;  Surgeon: Ronny Bacon, MD;  Location: [facility];  Service: General;  Laterality: Right;   BREAST BIOPSY Right 11/06/2019   [place] core path pending venus clip   HERNIA REPAIR Left    INCISION AND DRAINAGE ABSCESS N/A 01/26/2018   Procedure: INCISION AND DEBRIDEMENT OF VULVAR NECROTIZING SOFT TISSUE INFECTION;  Surgeon: Greer Pickerel, MD;  Location: [place];  Service: General;  Laterality: N/A;   INCISION AND DRAINAGE PERIRECTAL ABSCESS N/A 01/22/2018   Procedure: IRRIGATION AND DEBRIDEMENT LABIAL/VULVAR AREA;  Surgeon: Coralie Keens, MD;  Location: [place];  Service: General;  Laterality: N/A;   INCISION AND DRAINAGE PERIRECTAL ABSCESS N/A 01/29/2018   Procedure: IRRIGATION AND DEBRIDEMENT VULVA;  Surgeon: Excell Seltzer, MD;  Location: [place];  Service: General;  Laterality: N/A;   INGUINAL HERNIA REPAIR Left 04/08/2015   Procedure: OPEN LEFT INGUINAL HERNIA REPAIR WITH MESH;  Surgeon: Ralene Ok, MD;  Location: [place];  Service: General;  Laterality: Left;   INSERTION OF MESH Left 04/08/2015   Procedure: INSERTION OF MESH;  Surgeon: Ralene Ok, MD;  Location: [place];  Service: General;  Laterality: Left;   LAPAROSCOPY     Endometriosis   LEFT HEART CATHETERIZATION WITH CORONARY ANGIOGRAM N/A  12/05/2013   Procedure: LEFT HEART CATHETERIZATION WITH CORONARY ANGIOGRAM;  Surgeon: Jettie Booze, MD;  Location: [facility] CATH LAB;  Service: Cardiovascular;  Laterality: N/A;   MASTECTOMY Right    right kidney drained     SIMPLE MASTECTOMY WITH AXILLARY SENTINEL NODE BIOPSY Right 12/23/2019   Procedure: Right SIMPLE MASTECTOMY WITH AXILLARY SENTINEL NODE BIOPSY;  Surgeon: Ronny Bacon, MD;  Location: [facility];  Service: General;  Laterality: Right;   TEE WITHOUT CARDIOVERSION N/A  11/28/2013   Procedure: TRANSESOPHAGEAL ECHOCARDIOGRAM (TEE);  Surgeon: Thayer Headings, MD;  Location: [place];  Service: Cardiovascular;  Laterality: N/A;   WOUND DEBRIDEMENT Right 01/29/2020   Procedure: DEBRIDEMENT WOUND, Excisional debridement skin, subcutaneous and muscle right chest wall;  Surgeon: Ronny Bacon, MD;  Location: [facility];  Service: General;  Laterality: Right;    Family History  Problem Relation Age of Onset   Venous thrombosis Brother    Other Brother        BRAIN TUMOR   Asthma Father    Diabetes Father    Coronary artery disease Mother    Hypertension Mother    Diabetes Mother    Breast cancer Mother 58   Asthma Sister    Diabetes type II Brother    Diabetes Brother     Allergies  Allergen Reactions   Adhesive [Tape] Rash and Other (See Comments)    TAKES OFF THE SKIN (CERTAIN MEDICAL TAPES DO THIS!!)   Metoprolol Shortness Of Breath    Occurrence of shortness of breath after 3 days   Montelukast Shortness Of Breath   Morphine Sulfate Anaphylaxis, Shortness Of Breath and Nausea And Vomiting    Swollen Throat - Able to tolerate dilaudid   Penicillins Anaphylaxis, Hives and Shortness Of Breath    Throat swells Has patient had a PCN reaction causing immediate rash, facial/tongue/throat swelling, SOB or lightheadedness with hypotension: Yes Has patient had a PCN reaction causing severe rash involving mucus membranes or skin necrosis: No Has patient had a PCN reaction  that required hospitalization: Yes Has patient had a PCN reaction occurring within the last 10 years: No If all of the above answers are "NO", then may proceed with Cephalosporin use.    Diltiazem Swelling   Gabapentin Swelling   Midodrine     Lightheaded and falling down    Current Outpatient Medications on File Prior to Visit  Medication Sig Dispense Refill   albuterol (VENTOLIN HFA) 108 (90 Base) MCG/ACT inhaler Inhale 2 puffs into the lungs every 6 (six) hours as needed for wheezing or shortness of breath.     ALPRAZolam (XANAX) 1 MG tablet Take 1 mg by mouth 4 (four) times daily as needed for anxiety.      anastrozole (ARIMIDEX) 1 MG tablet Take 1 tablet (1 mg total) by mouth daily. 30 tablet 3   aspirin EC 81 MG tablet Take 81 mg by mouth daily before breakfast.     blood glucose meter kit and supplies Use up to four times daily as directed. (FOR ICD-10 E10.9, E11.9). 1 each 0   budesonide-formoterol (SYMBICORT) 80-4.5 MCG/ACT inhaler INHALE 2 PUFFS BY MOUTH EVERY 12 HOURS TO PREVENT COUGH OR WHEEZING *RINSE MOUTH AFTER EACH USE* 10.2 g 2   busPIRone (BUSPAR) 30 MG tablet Take 30 mg by mouth 2 (two) times daily.     citalopram (CELEXA) 20 MG tablet Take 1 tablet (20 mg total) by mouth daily. 30 tablet 2   desvenlafaxine (PRISTIQ) 100 MG 24 hr tablet Take 100 mg by mouth daily.     Desvenlafaxine ER 100 MG TB24 Take 1 tablet by mouth every morning.     diclofenac Sodium (VOLTAREN) 1 % GEL Apply 2 g topically 3 (three) times daily as needed. For pain. 100 g 0   Empagliflozin-metFORMIN HCl ER 25-1000 MG TB24 Take 1 tablet by mouth daily with breakfast.     EYLEA 2 MG/0.05ML SOSY      FLUoxetine (PROZAC) 40 MG capsule Take  40 mg by mouth daily.     glucose blood test strip Use as instructed 300 each 0   Insulin Pen Needle 32G X 4 MM MISC Used to give insulin injections twice daily. 100 each 11   insulin regular human CONCENTRATED (HUMULIN R) 500 UNIT/ML injection Inject 30 Units into  the skin 3 (three) times daily with meals. 30 U BID     Lido-Capsaicin-Men-Methyl Sal 0.5-0.035-5-20 % PTCH Apply 1 patch to the skin every 8 hours as needed for pain. Rotate sites of patch. 30 patch 0   methocarbamol (ROBAXIN) 500 MG tablet Take 1 tablet (500 mg total) by mouth 2 (two) times daily as needed for muscle spasms. 10 tablet 0   mupirocin ointment (BACTROBAN) 2 % Apply 1 application topically 2 (two) times daily. Apply to the affected area 2 times a day 22 g 3   mupirocin ointment (BACTROBAN) 2 % Apply 1 application topically 2 (two) times daily. 22 g 0   OXYGEN Inhale 2-4 L into the lungs 2 (two) times daily as needed (shortness of breath).     pregabalin (LYRICA) 300 MG capsule Take 1 capsule (300 mg total) by mouth 2 (two) times daily. For nerve pain. 180 capsule 0   ReliOn Ultra Thin Lancets 30G MISC Use as directed for blood sugar checks. 300 each 0   rOPINIRole (REQUIP) 1 MG tablet TAKE 1 TABLET BY MOUTH EVERY DAY AT BEDTIME FOR  RESTLESS  LEG 90 tablet 0   rosuvastatin (CRESTOR) 20 MG tablet Take 1 tablet (20 mg total) by mouth daily. For cholesterol. 90 tablet 3   spironolactone (ALDACTONE) 50 MG tablet Take 50 mg by mouth daily.     sulfamethoxazole-trimethoprim (BACTRIM DS) 800-160 MG tablet Take 1 tablet by mouth 2 (two) times daily. 20 tablet 0   tobramycin (TOBREX) 0.3 % ophthalmic solution 1 drop 4 (four) times daily.     topiramate (TOPAMAX) 50 MG tablet Take 1 tablet (50 mg total) by mouth 2 (two) times daily. For headache prevention. 180 tablet 1   traMADol (ULTRAM) 50 MG tablet Take 1 tablet (50 mg total) by mouth every 8 (eight) hours as needed for severe pain. 15 tablet 0   traZODone (DESYREL) 100 MG tablet Take 200 mg by mouth at bedtime.     UBRELVY 50 MG TABS Take by mouth.     ULTICARE MINI PEN NEEDLES 31G X 6 MM MISC USE AS DIRECTED 3 TIMES DAILY 100 each 0   Insulin Syringe-Needle U-100 (GLOBAL INJECT EASE INSULIN SYR) 30G X 1/2" 1 ML MISC See admin  instructions.     No current facility-administered medications on file prior to visit.    BP 124/62    Pulse 82    Temp 98.6 F (37 C) (Temporal)    Ht 5' 4"  (1.626 m)    Wt 213 lb (96.6 kg)    LMP  (LMP Unknown)    SpO2 96%    BMI 36.56 kg/m  Objective:   Physical Exam Skin:    Comments: Moderate reduction to swelling of left second digit at medial PIP joint. Improved ROM. Less erythema. Site appears to be healing well.   2.5 X 1 cm raised, crusted, fluctuant mass to posterior mid parietal lobe. Surrounding erythema.   Neurological:     Mental Status: She is alert.          Assessment & Plan:      This visit occurred during the SARS-CoV-2 public health emergency.  Safety protocols were in place, including screening questions prior to the visit, additional usage of staff PPE, and extensive cleaning of exam room while observing appropriate contact time as indicated for disinfecting solutions.

## 2021-06-29 NOTE — Addendum Note (Signed)
Addended by: Ellamae Sia on: 06/29/2021 09:00 AM   Modules accepted: Orders

## 2021-06-29 NOTE — Assessment & Plan Note (Signed)
Continued with evidence of some drainage given crusting.  Patient requested the site be lanced. Given fluctuance of area we proceeded.   Consent obtained.  Site cleansed with betadine solution. Lidocaine 2% with Epi utilized for analgesia. Site lanced with 11 blade, some whitish discharge, mostly bloody discharge resulted. Site cleansed and dressed. Home care instructions provided. Continue oral antibiotics. Patient tolerated well.  Follow up in 2-3 days.

## 2021-06-29 NOTE — Assessment & Plan Note (Signed)
Moderate improvement since last visit. Xray's reviewed and no evidence of osteomyelitis. She will see the orthopedic surgeon tomorrow.  Follow up in 2 days.

## 2021-06-29 NOTE — Addendum Note (Signed)
Addended by: Francella Solian on: 06/29/2021 09:53 AM   Modules accepted: Orders

## 2021-06-30 ENCOUNTER — Telehealth: Payer: Medicare Other | Admitting: Primary Care

## 2021-06-30 DIAGNOSIS — I83023 Varicose veins of left lower extremity with ulcer of ankle: Secondary | ICD-10-CM | POA: Diagnosis not present

## 2021-06-30 DIAGNOSIS — L03012 Cellulitis of left finger: Secondary | ICD-10-CM | POA: Diagnosis not present

## 2021-06-30 DIAGNOSIS — L97329 Non-pressure chronic ulcer of left ankle with unspecified severity: Secondary | ICD-10-CM | POA: Diagnosis not present

## 2021-07-02 ENCOUNTER — Ambulatory Visit: Payer: Medicare Other | Admitting: Primary Care

## 2021-07-02 ENCOUNTER — Telehealth: Payer: Self-pay

## 2021-07-02 DIAGNOSIS — L739 Follicular disorder, unspecified: Secondary | ICD-10-CM

## 2021-07-02 DIAGNOSIS — A4902 Methicillin resistant Staphylococcus aureus infection, unspecified site: Secondary | ICD-10-CM

## 2021-07-02 LAB — WOUND CULTURE
MICRO NUMBER:: 12780215
SPECIMEN QUALITY:: ADEQUATE

## 2021-07-02 MED ORDER — DOXYCYCLINE HYCLATE 100 MG PO TABS: 100.0000 mg | ORAL_TABLET | Freq: Two times a day (BID) | ORAL | 0 refills | Status: DC

## 2021-07-02 NOTE — Telephone Encounter (Signed)
Pt returned phone call from [facility]

## 2021-07-02 NOTE — Telephone Encounter (Signed)
Patient was scheduled for follow up to day after I&D in office and cellulitis on finger. Patient cancelled appointment. Called and left message to call back so that we can check on patient.

## 2021-07-02 NOTE — Telephone Encounter (Signed)
Called patient she cancelled due to power. Wanted to get refill of antibiotic. States takes last one today. Patient states that it is still very red on top. Thinks she needs to get more antibiotics.

## 2021-07-02 NOTE — Telephone Encounter (Signed)
Please call patient:  Her wound culture returned positive for MRSA and is resistant to Bactrim. This is the antibiotic she's been taking.  Will send in Doxycycline 100 mg for her to take BID x 10 days. Needs to be rescheduled for in office visit and follow up with someone 1 one week.

## 2021-07-02 NOTE — Addendum Note (Signed)
Addended by: Pleas Koch on: 07/02/2021 12:23 PM   Modules accepted: Orders

## 2021-07-06 ENCOUNTER — Telehealth: Payer: Self-pay | Admitting: Primary Care

## 2021-07-06 NOTE — Telephone Encounter (Signed)
Pt mother n law called stating that she would like a call back to discuss some concerns about pt. Please advise.

## 2021-07-06 NOTE — Telephone Encounter (Signed)
Left message to return call to our office.  

## 2021-07-06 NOTE — Telephone Encounter (Signed)
Left message to return call to our office.  With number for mother in law.

## 2021-07-07 ENCOUNTER — Ambulatory Visit: Payer: Medicare Other | Admitting: Primary Care

## 2021-07-08 ENCOUNTER — Ambulatory Visit: Payer: Medicare Other | Admitting: Family

## 2021-07-08 DIAGNOSIS — L03012 Cellulitis of left finger: Secondary | ICD-10-CM | POA: Diagnosis not present

## 2021-07-08 NOTE — Telephone Encounter (Signed)
Noted and agree. 

## 2021-07-08 NOTE — Telephone Encounter (Signed)
Was able to talk to mother in law. She has several in depth questions about oncology history. I have answered what I could. Advised her of patient follow up with oncology and recommended that she attend that appointment to get better understanding of her current and historical oncology information. She has agreed and will reach out if any further questions.

## 2021-07-09 ENCOUNTER — Other Ambulatory Visit: Payer: Self-pay

## 2021-07-09 ENCOUNTER — Encounter: Payer: Self-pay | Admitting: Nurse Practitioner

## 2021-07-09 ENCOUNTER — Ambulatory Visit (INDEPENDENT_AMBULATORY_CARE_PROVIDER_SITE_OTHER): Payer: BC Managed Care – PPO | Admitting: Nurse Practitioner

## 2021-07-09 VITALS — BP 98/60 | HR 71 | Temp 97.6°F | Ht 64.0 in | Wt 214.0 lb

## 2021-07-09 DIAGNOSIS — L03012 Cellulitis of left finger: Secondary | ICD-10-CM | POA: Diagnosis not present

## 2021-07-09 DIAGNOSIS — A4902 Methicillin resistant Staphylococcus aureus infection, unspecified site: Secondary | ICD-10-CM | POA: Diagnosis not present

## 2021-07-09 DIAGNOSIS — L0291 Cutaneous abscess, unspecified: Secondary | ICD-10-CM | POA: Insufficient documentation

## 2021-07-09 HISTORY — DX: Cutaneous abscess, unspecified: L02.91

## 2021-07-09 MED ORDER — TRAMADOL HCL 50 MG PO TABS: 50.0000 mg | ORAL_TABLET | Freq: Three times a day (TID) | ORAL | 0 refills | Status: DC | PRN

## 2021-07-09 MED ORDER — LIDOCAINE-EPINEPHRINE 2 %-1:100000 IJ SOLN: 1.5000 mL | Freq: Once | INTRAMUSCULAR | Status: DC

## 2021-07-09 MED ORDER — CLINDAMYCIN HCL 300 MG PO CAPS: 300.0000 mg | ORAL_CAPSULE | Freq: Four times a day (QID) | ORAL | 0 refills | Status: AC

## 2021-07-09 NOTE — Assessment & Plan Note (Addendum)
Large abscess posterior scalp see clinical photo.  I&D performed in office tolerated well.  Bleeding controlled patient left.  Dressing instructions reviewed with patient did change antibiotics from doxycycline to clindamycin since not being responsive currently.  Patient also on Bactrim for cellulitis of finger.  She will continue both currently did reach out to orthopedic office to see if they would be okay discontinuing Bactrim since she is on clindamycin waiting for phone call back.  Patient already had abscess incised and drained previously and culture obtained.  Reached out to inpatient pharmacist along with her back from [facility] orthopedic clinic everybody is in agreements that we can discontinue Bactrim and discontinue the clindamycin.  Did call patient update her on plan of care.  Continue to monitor keep follow-up on Tuesday.

## 2021-07-09 NOTE — Assessment & Plan Note (Signed)
Culture came positive for MRSA

## 2021-07-09 NOTE — Patient Instructions (Signed)
STOP the Doxycycline antibiotic. I am going to change it to Clindamycin. We need to see you back on Tuesday to make sure you are improving If you start getting fever and chills, become altered or the infection spreads to your neck/throat/face go be seen in the emergency department.

## 2021-07-09 NOTE — Progress Notes (Signed)
Acute Office Visit  Subjective:    Patient ID: [name], female    DOB: January 24, 1971, 50 y.o.   MRN: [id number]  Chief Complaint  Patient presents with   Bump on Top of Head    Wore a wig during radiation treatment. Anda Kraft performed culture and positive for MRSA and infected hair follicles.     HPI Patient is in today for Abscess  Was seen on 06/29/2021 and abcscess lanced and came back MRSA + and was resisitant to [place]. This was the antibiotic she was placed on. Provider switched antibiotics and was placed on the doxycycline and states that it is not improving. States that she has 3 days left of the doxycycline Drainaging still bloody discharge and puss. Does hurt. Hair is matted around the lesions   Oncology Dr. Loren Racer Has breast cancer and ovarian Currenlty doing treatments  Past Medical History:  Diagnosis Date   Anxiety    takes Prozac daily   Anxiety    Aortic valve calcification    Asthma    Advair and Spirva daily   Asthma    Bipolar disorder ([place])    Breast cancer, right breast ([place]) 12/23/2019   CAD (coronary artery disease)    a. LHC 11/2013 done for CP/fluid retention: mild disease in prox LAD, mild-mod disease in mRCA, EF 60% with normal LVEDP. b. Normal nuc 03/2016.   Cancer ([address])    Cellulitis and abscess of trunk 03/12/2020   CHF (congestive heart failure) (HCC)    Chronic diastolic CHF (congestive heart failure) (HCC)    Chronic heart failure with preserved ejection fraction ([place]) 11/16/2018   CKD (chronic kidney disease), stage II    COPD (chronic obstructive pulmonary disease) (HCC)    a. nocturnal O2.   COPD (chronic obstructive pulmonary disease) (HCC)    Coronary artery disease    Decreased urine stream    Diabetes mellitus    Diabetes mellitus without complication (HCC)    Dyspnea    Elevated C-reactive protein (CRP) 01/29/2018   Elevated sedimentation rate 01/29/2018   Elevated serum hCG 01/19/2018   Family history of adverse reaction to  anesthesia    mom gets nauseated   Foot pain, bilateral 05/22/2018   GERD (gastroesophageal reflux disease)    takes Pepcid daily   History of blood clots    left leg 3-60yr ago   Hyperlipidemia    Hypertension    Hypertriglyceridemia    Inguinal hernia, left 01/2015   Muscle spasm    Open wound of genital labia    Peripheral neuropathy    RBBB    Seizures ([place]    Sepsis ([place] 01/19/2018   Sinus tachycardia    a. persistent since 2009.   Sm[place]' cough [place]    Status post mastectomy, right 01/14/2020   [facility]   left sided weakness   TIA (transient ischemic attack)    Tobacco abuse    Vulvar abscess 01/23/2018    Past Surgical History:  Procedure Laterality Date   APPLICATION OF WOUND VAC Right 01/29/2020   Procedure: APPLICATION OF WOUND VAC;  Surgeon: RRonny Bacon MD;  Location: [facility] ORS;  Service: General;  Laterality: Right;   BREAST BIOPSY Right 11/06/2019   u[place]oreacore path pending venus clip   HERNIA REPAIR Left    INCISION AND DRAINAGE ABSCESS N/A 01/26/2018   Procedure: INCISION AND DEBRIDEMENT OF VULVAR NECROTIZING SOFT TISSUE INFECTION;  Surgeon: WGreer Pickerel MD;  [place]  Service: General;  Laterality: N/A;   INCISION AND DRAINAGE PERIRECTAL ABSCESS N/A 01/22/2018   Procedure: IRRIGATION AND DEBRIDEMENT LABIAL/VULVAR AREA;  Surgeon: Coralie Keens, MD;  Location: [place];  Service: General;  Laterality: N/A;   INCISION AND DRAINAGE PERIRECTAL ABSCESS N/A 01/29/2018   Procedure: IRRIGATION AND DEBRIDEMENT VULVA;  Surgeon: Excell Seltzer, MD;  Location: [place];  Service: General;  Laterality: N/A;   INGUINAL HERNIA REPAIR Left 04/08/2015   Procedure: OPEN LEFT INGUINAL HERNIA REPAIR WITH MESH;  Surgeon: Ralene Ok, MD;  Location: [place];  Service: General;  Laterality: Left;   INSERTION OF MESH Left 04/08/2015   Procedure: INSERTION OF MESH;  Surgeon: Ralene Ok, MD;  Location: [place];  Service: General;  Laterality: Left;   LAPAROSCOPY      Endometriosis   LEFT HEART CATHETERIZATION WITH CORONARY ANGIOGRAM N/A 12/05/2013   Procedure: LEFT HEART CATHETERIZATION WITH CORONARY ANGIOGRAM;  Surgeon: Jettie Booze, MD;  Location: [facility] CATH LAB;  Service: Cardiovascular;  Laterality: N/A;   MASTECTOMY Right    right kidney drained     SIMPLE MASTECTOMY WITH AXILLARY SENTINEL NODE BIOPSY Right 12/23/2019   Procedure: Right SIMPLE MASTECTOMY WITH AXILLARY SENTINEL NODE BIOPSY;  Surgeon: Ronny Bacon, MD;  Location: [facility];  Service: General;  Laterality: Right;   TEE WITHOUT CARDIOVERSION N/A 11/28/2013   Procedure: TRANSESOPHAGEAL ECHOCARDIOGRAM (TEE);  Surgeon: Thayer Headings, MD;  Location: [place];  Service: Cardiovascular;  Laterality: N/A;   WOUND DEBRIDEMENT Right 01/29/2020   Procedure: DEBRIDEMENT WOUND, Excisional debridement skin, subcutaneous and muscle right chest wall;  Surgeon: Ronny Bacon, MD;  Location: [facility];  Service: General;  Laterality: Right;    Family History  Problem Relation Age of Onset   Venous thrombosis Brother    Other Brother        BRAIN TUMOR   Asthma Father    Diabetes Father    Coronary artery disease Mother    Hypertension Mother    Diabetes Mother    Breast cancer Mother 58   Asthma Sister    Diabetes type II Brother    Diabetes Brother     Social History   Socioeconomic History   Marital status: Married    Spouse name: Not on file   Number of children: 2   Years of education: Not on file   Highest education level: Not on file  Occupational History   Not on file  Tobacco Use   Smoking status: Some Days    Packs/day: 0.25    Years: 36.00    Pack years: 9.00    Types: Cigarettes    Start date: 07/11/1981   Smokeless tobacco: Never   Tobacco comments:    1 ppd now  Vaping Use   Vaping Use: Some days  Substance and Sexual Activity   Alcohol use: No   Drug use: No   Sexual activity: Not Currently  Other Topics Concern   Not on file  Social History  Narrative   ** Merged History Encounter **       Lives in [place]   Married but lives with Boyfriend - not legally separated   Disabled - for BiPolar, Seizure disorder, diabetes   Formerly worked at [employer] 1 ppd; no alcohol. 2 step sons; no biologic children.       Social Determinants of Radio broadcast assistant Strain: Not on file  Food Insecurity: No Food Insecurity   Worried About Charity fundraiser  in the Last Year: Never true   Ran Out of Food in the Last Year: Never true  Transportation Needs: Not on file  Physical Activity: Not on file  Stress: Not on file  Social Connections: Not on file  Intimate Partner Violence: Not on file    Outpatient Medications Prior to Visit  Medication Sig Dispense Refill   albuterol (VENTOLIN HFA) 108 (90 Base) MCG/ACT inhaler Inhale 2 puffs into the lungs every 6 (six) hours as needed for wheezing or shortness of breath.     ALPRAZolam (XANAX) 1 MG tablet Take 1 mg by mouth 4 (four) times daily as needed for anxiety.      anastrozole (ARIMIDEX) 1 MG tablet Take 1 tablet (1 mg total) by mouth daily. 30 tablet 3   aspirin EC 81 MG tablet Take 81 mg by mouth daily before breakfast.     blood glucose meter kit and supplies Use up to four times daily as directed. (FOR ICD-10 E10.9, E11.9). 1 each 0   budesonide-formoterol (SYMBICORT) 80-4.5 MCG/ACT inhaler INHALE 2 PUFFS BY MOUTH EVERY 12 HOURS TO PREVENT COUGH OR WHEEZING *RINSE MOUTH AFTER EACH USE* 10.2 g 2   busPIRone (BUSPAR) 30 MG tablet Take 30 mg by mouth 2 (two) times daily.     citalopram (CELEXA) 20 MG tablet Take 1 tablet (20 mg total) by mouth daily. 30 tablet 2   desvenlafaxine (PRISTIQ) 100 MG 24 hr tablet Take 100 mg by mouth daily.     Desvenlafaxine ER 100 MG TB24 Take 1 tablet by mouth every morning.     diclofenac Sodium (VOLTAREN) 1 % GEL Apply 2 g topically 3 (three) times daily as needed. For pain. 100 g 0   doxycycline (VIBRA-TABS) 100 MG tablet Take 1  tablet (100 mg total) by mouth 2 (two) times daily. 20 tablet 0   Empagliflozin-metFORMIN HCl ER 25-1000 MG TB24 Take 1 tablet by mouth daily with breakfast.     EYLEA 2 MG/0.05ML SOSY      FLUoxetine (PROZAC) 40 MG capsule Take 40 mg by mouth daily.     glucose blood test strip Use as instructed 300 each 0   Insulin Pen Needle 32G X 4 MM MISC Used to give insulin injections twice daily. 100 each 11   insulin regular human CONCENTRATED (HUMULIN R) 500 UNIT/ML injection Inject 30 Units into the skin 3 (three) times daily with meals. 30 U BID     Lido-Capsaicin-Men-Methyl Sal 0.5-0.035-5-20 % PTCH Apply 1 patch to the skin every 8 hours as needed for pain. Rotate sites of patch. 30 patch 0   mupirocin ointment (BACTROBAN) 2 % Apply 1 application topically 2 (two) times daily. Apply to the affected area 2 times a day 22 g 3   mupirocin ointment (BACTROBAN) 2 % Apply 1 application topically 2 (two) times daily. 22 g 0   OXYGEN Inhale 2-4 L into the lungs 2 (two) times daily as needed (shortness of breath).     pregabalin (LYRICA) 300 MG capsule Take 1 capsule (300 mg total) by mouth 2 (two) times daily. For nerve pain. 180 capsule 0   ReliOn Ultra Thin Lancets 30G MISC Use as directed for blood sugar checks. 300 each 0   rOPINIRole (REQUIP) 1 MG tablet TAKE 1 TABLET BY MOUTH EVERY DAY AT BEDTIME FOR  RESTLESS  LEG 90 tablet 0   rosuvastatin (CRESTOR) 20 MG tablet Take 1 tablet (20 mg total) by mouth daily. For cholesterol. 90 tablet 3  spironolactone (ALDACTONE) 50 MG tablet Take 50 mg by mouth daily.     sulfamethoxazole-trimethoprim (BACTRIM DS) 800-160 MG tablet Take 1 tablet by mouth 2 (two) times daily.     tobramycin (TOBREX) 0.3 % ophthalmic solution 1 drop 4 (four) times daily.     topiramate (TOPAMAX) 50 MG tablet Take 1 tablet (50 mg total) by mouth 2 (two) times daily. For headache prevention. 180 tablet 1   traZODone (DESYREL) 100 MG tablet Take 200 mg by mouth at bedtime.     UBRELVY 50  MG TABS Take by mouth.     ULTICARE MINI PEN NEEDLES 31G X 6 MM MISC USE AS DIRECTED 3 TIMES DAILY 100 each 0   Insulin Syringe-Needle U-100 (GLOBAL INJECT EASE INSULIN SYR) 30G X 1/2" 1 ML MISC See admin instructions.     methocarbamol (ROBAXIN) 500 MG tablet Take 1 tablet (500 mg total) by mouth 2 (two) times daily as needed for muscle spasms. 10 tablet 0   traMADol (ULTRAM) 50 MG tablet Take 1 tablet (50 mg total) by mouth every 8 (eight) hours as needed for severe pain. 15 tablet 0   No facility-administered medications prior to visit.    Allergies  Allergen Reactions   Adhesive [Tape] Rash and Other (See Comments)    TAKES OFF THE SKIN (CERTAIN MEDICAL TAPES DO THIS!!)   Metoprolol Shortness Of Breath    Occurrence of shortness of breath after 3 days   Montelukast Shortness Of Breath   Morphine Sulfate Anaphylaxis, Shortness Of Breath and Nausea And Vomiting    Swollen Throat - Able to tolerate dilaudid   Penicillins Anaphylaxis, Hives and Shortness Of Breath    Throat swells Has patient had a PCN reaction causing immediate rash, facial/tongue/throat swelling, SOB or lightheadedness with hypotension: Yes Has patient had a PCN reaction causing severe rash involving mucus membranes or skin necrosis: No Has patient had a PCN reaction that required hospitalization: Yes Has patient had a PCN reaction occurring within the last 10 years: No If all of the above answers are "NO", then may proceed with Cephalosporin use.    Diltiazem Swelling   Gabapentin Swelling   Midodrine     Lightheaded and falling down    Review of Systems  Constitutional:  Positive for fever (2 days ago at 100.0). Negative for chills.  HENT:  Positive for sore throat.   Respiratory:  Negative for cough and shortness of breath.   Cardiovascular:  Negative for chest pain.  Gastrointestinal:  Negative for diarrhea, nausea and vomiting.  Skin:  Positive for color change and wound.      Objective:    Physical  Exam Vitals and nursing note reviewed.  Constitutional:      Appearance: Normal appearance.  Cardiovascular:     Rate and Rhythm: Normal rate.  Pulmonary:     Effort: Pulmonary effort is normal.  Skin:    Findings: Lesion present.     Comments: Large abscess to posterior scalp. Erythema, edema and tender to touch.  See clinical picture   Neurological:     Mental Status: She is alert.  Psychiatric:        Mood and Affect: Mood normal.        Behavior: Behavior normal.        Thought Content: Thought content normal.        Judgment: Judgment normal.     Diagnosis: abscess - Location: Posterior Scalp Procedure: Incision & drainage Informed consent:  Discussed risks and  benefits of the procedure, as well as the alternatives.  Informed consent was obtained. Anesthesia: 2% lidocaine with Epi The area was prepared and draped in a standard fashion. The lesion drained blood. And very little purulent drainage The patient tolerated the procedure well. The patient was instructed on post-op care. Wrapped with standard 4x4 guaze and kerlex wrap.  BP 98/60 (BP Location: Left Arm, Patient Position: Sitting, Cuff Size: Large)    Pulse 71    Temp 97.6 F (36.4 C)    Ht 5' 4"  (1.626 m)    Wt 214 lb (97.1 kg)    LMP  (LMP Unknown)    SpO2 98%    BMI 36.73 kg/m  Wt Readings from Last 3 Encounters:  07/09/21 214 lb (97.1 kg)  06/29/21 213 lb (96.6 kg)  06/23/21 212 lb (96.2 kg)    Health Maintenance Due  Topic Date Due   Pneumococcal Vaccine 16-54 Years old (3 - PPSV23 if available, else PCV20) 05/11/2017   OPHTHALMOLOGY EXAM  08/29/2020   HEMOGLOBIN A1C  06/03/2021   TETANUS/TDAP  06/14/2021    There are no preventive care reminders to display for this patient.   Lab Results  Component Value Date   TSH 0.66 10/01/2020   Lab Results  Component Value Date   WBC 13.1 (H) 06/23/2021   HGB 13.9 06/23/2021   HCT 41.0 06/23/2021   MCV 84.7 06/23/2021   PLT 197.0 06/23/2021   Lab  Results  Component Value Date   NA 135 05/12/2021   K 4.6 05/12/2021   CO2 27 05/12/2021   GLUCOSE 317 (H) 05/12/2021   BUN 25 (H) 05/12/2021   CREATININE 1.04 (H) 05/12/2021   BILITOT 0.8 05/12/2021   ALKPHOS 191 (H) 05/12/2021   AST 14 (L) 05/12/2021   ALT 18 05/12/2021   PROT 8.6 (H) 05/12/2021   ALBUMIN 3.8 05/12/2021   CALCIUM 9.1 05/12/2021   ANIONGAP 10 05/12/2021   GFR 52.28 (L) 12/01/2020   Lab Results  Component Value Date   CHOL 150 05/15/2020   Lab Results  Component Value Date   HDL 24.30 (L) 05/15/2020   Lab Results  Component Value Date   LDLCALC 98 09/14/2018   Lab Results  Component Value Date   TRIG 284.0 (H) 05/15/2020   Lab Results  Component Value Date   CHOLHDL 6 05/15/2020   Lab Results  Component Value Date   HGBA1C 10.6 (A) 12/01/2020       Assessment & Plan:   Problem List Items Addressed This Visit       Other   Cellulitis of finger of left hand   MRSA infection    Culture came positive for MRSA      Relevant Medications   sulfamethoxazole-trimethoprim (BACTRIM DS) 800-160 MG tablet   clindamycin (CLEOCIN) 300 MG capsule   Abscess - Primary    Large abscess posterior scalp see clinical photo.  I&D performed in office tolerated well.  Bleeding controlled patient left.  Dressing instructions reviewed with patient did change antibiotics from doxycycline to clindamycin since not being responsive currently.  Patient also on Bactrim for cellulitis of finger.  She will continue both currently did reach out to orthopedic office to see if they would be okay discontinuing Bactrim since she is on clindamycin waiting for phone call back.  Patient already had abscess incised and drained previously and culture obtained      Relevant Medications   clindamycin (CLEOCIN) 300 MG capsule   traMADol (ULTRAM)  50 MG tablet   lidocaine-EPINEPHrine (XYLOCAINE W/EPI) 2 %-1:100000 (with pres) injection 1.5 mL (Start on 07/09/2021  1:30 PM)     No  orders of the defined types were placed in this encounter.  This visit occurred during the SARS-CoV-2 public health emergency.  Safety protocols were in place, including screening questions prior to the visit, additional usage of staff PPE, and extensive cleaning of exam room while observing appropriate contact time as indicated for disinfecting solutions.   Romilda Garret, NP

## 2021-07-10 DIAGNOSIS — Z720 Tobacco use: Secondary | ICD-10-CM

## 2021-07-10 DIAGNOSIS — Z794 Long term (current) use of insulin: Secondary | ICD-10-CM

## 2021-07-10 DIAGNOSIS — J449 Chronic obstructive pulmonary disease, unspecified: Secondary | ICD-10-CM

## 2021-07-10 DIAGNOSIS — E1159 Type 2 diabetes mellitus with other circulatory complications: Secondary | ICD-10-CM

## 2021-07-10 DIAGNOSIS — E1165 Type 2 diabetes mellitus with hyperglycemia: Secondary | ICD-10-CM | POA: Diagnosis not present

## 2021-07-10 DIAGNOSIS — F3131 Bipolar disorder, current episode depressed, mild: Secondary | ICD-10-CM

## 2021-07-10 DIAGNOSIS — I5032 Chronic diastolic (congestive) heart failure: Secondary | ICD-10-CM

## 2021-07-13 ENCOUNTER — Other Ambulatory Visit: Payer: Self-pay

## 2021-07-13 ENCOUNTER — Ambulatory Visit (INDEPENDENT_AMBULATORY_CARE_PROVIDER_SITE_OTHER): Payer: BC Managed Care – PPO | Admitting: Nurse Practitioner

## 2021-07-13 VITALS — BP 112/68 | HR 72 | Temp 97.4°F | Resp 18 | Ht 64.0 in | Wt 220.4 lb

## 2021-07-13 DIAGNOSIS — L0291 Cutaneous abscess, unspecified: Secondary | ICD-10-CM

## 2021-07-13 DIAGNOSIS — S301XXA Contusion of abdominal wall, initial encounter: Secondary | ICD-10-CM | POA: Diagnosis not present

## 2021-07-13 DIAGNOSIS — T148XXA Other injury of unspecified body region, initial encounter: Secondary | ICD-10-CM | POA: Insufficient documentation

## 2021-07-13 LAB — CBC WITH DIFFERENTIAL/PLATELET
Basophils Absolute: 0.1 10*3/uL (ref 0.0–0.1)
Basophils Relative: 0.8 % (ref 0.0–3.0)
Eosinophils Absolute: 0.2 10*3/uL (ref 0.0–0.7)
Eosinophils Relative: 2.4 % (ref 0.0–5.0)
HCT: 37 % (ref 36.0–46.0)
Hemoglobin: 12.3 g/dL (ref 12.0–15.0)
Lymphocytes Relative: 13.9 % (ref 12.0–46.0)
Lymphs Abs: 1.2 10*3/uL (ref 0.7–4.0)
MCHC: 33.3 g/dL (ref 30.0–36.0)
MCV: 87 fl (ref 78.0–100.0)
Monocytes Absolute: 0.5 10*3/uL (ref 0.1–1.0)
Monocytes Relative: 5.9 % (ref 3.0–12.0)
Neutro Abs: 6.8 10*3/uL (ref 1.4–7.7)
Neutrophils Relative %: 77 % (ref 43.0–77.0)
Platelets: 194 10*3/uL (ref 150.0–400.0)
RBC: 4.25 Mil/uL (ref 3.87–5.11)
RDW: 16.4 % — ABNORMAL HIGH (ref 11.5–15.5)
WBC: 8.8 10*3/uL (ref 4.0–10.5)

## 2021-07-13 NOTE — Patient Instructions (Signed)
Nice to see you today Will follow up with you in regards to the lab work. Keep an eye on the hematoma on your belly. Use Ice and avoid giving yourself injections in that area, use the other side of your abdomen. The wound clinic should reach out to you and schedule an appointment

## 2021-07-13 NOTE — Progress Notes (Signed)
Established Patient Office Visit  Subjective:  Patient ID: [name], female    DOB: 1970-11-09  Age: 51 y.o. MRN: [id number]  CC:  Chief Complaint  Patient presents with   Follow up on swelling/bump on her head   Bleeding/Bruising    Bruising/redness on the right abdomen area, noticed it 07/12/21. No trauma or falls    HPI [name] presents for Recheck of abscess  Was seen by Allie Bossier on 06/24/2021  and I&D performed and placed on abx. Culture came back resistant to abx and swtiched to doxycycline. Patient then followed up with me on 07/09/2021 and she underwent another I&D. Here today for follow up. Per patient report not a great improvement and has been taking the abx I switched her to like recommended.  States she did switch the abx with some improvement States that no fever or chills States white bumps in mouth, throat and nose.   States that she noitced a hematoma last night. In the place where she gives her insulin. TTP. States she feel like it is growing in size. No injury or trauma per patient report.   Past Medical History:  Diagnosis Date   Anxiety    takes Prozac daily   Anxiety    Aortic valve calcification    Asthma    Advair and Spirva daily   Asthma    Bipolar disorder (HCC)    Breast cancer, right breast ([place]) 12/23/2019   CAD (coronary artery disease)    a. LHC 11/2013 done for CP/fluid retention: mild disease in prox LAD, mild-mod disease in mRCA, EF 60% with normal LVEDP. b. Normal nuc 03/2016.   Cancer ([name])    Cellulitis and abscess of trunk 03/12/2020   CHF (congestive heart failure) (HCC)    Chronic diastolic CHF (congestive heart failure) (HCC)    Chronic heart failure with preserved ejection fraction ([place]) 11/16/2018   CKD (chronic kidney disease), stage II    COPD (chronic obstructive pulmonary disease) (HCC)    a. nocturnal O2.   COPD (chronic obstructive pulmonary disease) (HCC)    Coronary artery disease    Decreased urine stream     Diabetes mellitus    Diabetes mellitus without complication (HCC)    Dyspnea    Elevated C-reactive protein (CRP) 01/29/2018   Elevated sedimentation rate 01/29/2018   Elevated serum hCG 01/19/2018   Family history of adverse reaction to anesthesia    mom gets nauseated   Foot pain, bilateral 05/22/2018   GERD (gastroesophageal reflux disease)    takes Pepcid daily   History of blood clots    left leg 3-30yr ago   Hyperlipidemia    Hypertension    Hypertriglyceridemia    Inguinal hernia, left 01/2015   Muscle spasm    Open wound of genital labia    Peripheral neuropathy    RBBB    Seizures ([place]    [place]epsis [place] 01/19/2018   Sinus tachycardia    a. persistent since 2009.   [place]    Status post mastectomy, right 01/14/2020   Stroke ([facility] 1989   left sided weakness   TIA (transient ischemic attack)    Tobacco abuse    Vulvar abscess 01/23/2018    Past Surgical History:  Procedure Laterality Date   APPLICATION OF WOUND VAC Right 01/29/2020   Procedure: APPLICATION OF WOUND VAC;  Surgeon: RRonny Bacon MD;  Location: [facility];  Service: General;  Laterality: Right;   BREAST BIOPSY Right 11/06/2019  [place] core path pending venus clip   HERNIA REPAIR Left    INCISION AND DRAINAGE ABSCESS N/A 01/26/2018   Procedure: INCISION AND DEBRIDEMENT OF VULVAR NECROTIZING SOFT TISSUE INFECTION;  Surgeon: Greer Pickerel, MD;  Location: [place];  Service: General;  Laterality: N/A;   INCISION AND DRAINAGE PERIRECTAL ABSCESS N/A 01/22/2018   Procedure: IRRIGATION AND DEBRIDEMENT LABIAL/VULVAR AREA;  Surgeon: Coralie Keens, MD;  Location: [place];  Service: General;  Laterality: N/A;   INCISION AND DRAINAGE PERIRECTAL ABSCESS N/A 01/29/2018   Procedure: IRRIGATION AND DEBRIDEMENT VULVA;  Surgeon: Excell Seltzer, MD;  Location: [place];  Service: General;  Laterality: N/A;   INGUINAL HERNIA REPAIR Left 04/08/2015   Procedure: OPEN LEFT INGUINAL HERNIA REPAIR WITH MESH;  Surgeon: Ralene Ok, MD;  Location: [place];  Service: General;  Laterality: Left;   INSERTION OF MESH Left 04/08/2015   Procedure: INSERTION OF MESH;  Surgeon: Ralene Ok, MD;  Location: [place];  Service: General;  Laterality: Left;   LAPAROSCOPY     Endometriosis   LEFT HEART CATHETERIZATION WITH CORONARY ANGIOGRAM N/A 12/05/2013   Procedure: LEFT HEART CATHETERIZATION WITH CORONARY ANGIOGRAM;  Surgeon: Jettie Booze, MD;  Location: [facility] CATH LAB;  Service: Cardiovascular;  Laterality: N/A;   MASTECTOMY Right    right kidney drained     SIMPLE MASTECTOMY WITH AXILLARY SENTINEL NODE BIOPSY Right 12/23/2019   Procedure: Right SIMPLE MASTECTOMY WITH AXILLARY SENTINEL NODE BIOPSY;  Surgeon: Ronny Bacon, MD;  Location: [facility];  Service: General;  Laterality: Right;   TEE WITHOUT CARDIOVERSION N/A 11/28/2013   Procedure: TRANSESOPHAGEAL ECHOCARDIOGRAM (TEE);  Surgeon: Thayer Headings, MD;  Location: [place];  Service: Cardiovascular;  Laterality: N/A;   WOUND DEBRIDEMENT Right 01/29/2020   Procedure: DEBRIDEMENT WOUND, Excisional debridement skin, subcutaneous and muscle right chest wall;  Surgeon: Ronny Bacon, MD;  Location: [facility];  Service: General;  Laterality: Right;    Family History  Problem Relation Age of Onset   Venous thrombosis Brother    Other Brother        BRAIN TUMOR   Asthma Father    Diabetes Father    Coronary artery disease Mother    Hypertension Mother    Diabetes Mother    Breast cancer Mother 68   Asthma Sister    Diabetes type II Brother    Diabetes Brother     Social History   Socioeconomic History   Marital status: Married    Spouse name: Not on file   Number of children: 2   Years of education: Not on file   Highest education level: Not on file  Occupational History   Not on file  Tobacco Use   Smoking status: Some Days    Packs/day: 0.25    Years: 36.00    Pack years: 9.00    Types: Cigarettes    Start date: 07/11/1981   Smokeless  tobacco: Never   Tobacco comments:    1 ppd now  Vaping Use   Vaping Use: Some days  Substance and Sexual Activity   Alcohol use: No   Drug use: No   Sexual activity: Not Currently  Other Topics Concern   Not on file  Social History Narrative   ** Merged History Encounter **       Lives in [place]   Married but lives with Boyfriend - not legally separated   Disabled - for BiPolar, Seizure disorder, diabetes   Formerly worked at [employer]  Smokes 1 ppd; no alcohol. 2 step sons; no biologic children.       Social Determinants of Radio broadcast assistant Strain: Not on file  Food Insecurity: No Food Insecurity   Worried About Charity fundraiser in the Last Year: Never true   Ran Out of Food in the Last Year: Never true  Transportation Needs: Not on file  Physical Activity: Not on file  Stress: Not on file  Social Connections: Not on file  Intimate Partner Violence: Not on file    Outpatient Medications Prior to Visit  Medication Sig Dispense Refill   albuterol (VENTOLIN HFA) 108 (90 Base) MCG/ACT inhaler Inhale 2 puffs into the lungs every 6 (six) hours as needed for wheezing or shortness of breath.     ALPRAZolam (XANAX) 1 MG tablet Take 1 mg by mouth 4 (four) times daily as needed for anxiety.      anastrozole (ARIMIDEX) 1 MG tablet Take 1 tablet (1 mg total) by mouth daily. 30 tablet 3   aspirin EC 81 MG tablet Take 81 mg by mouth daily before breakfast.     blood glucose meter kit and supplies Use up to four times daily as directed. (FOR ICD-10 E10.9, E11.9). 1 each 0   budesonide-formoterol (SYMBICORT) 80-4.5 MCG/ACT inhaler INHALE 2 PUFFS BY MOUTH EVERY 12 HOURS TO PREVENT COUGH OR WHEEZING *RINSE MOUTH AFTER EACH USE* 10.2 g 2   busPIRone (BUSPAR) 30 MG tablet Take 30 mg by mouth 2 (two) times daily.     citalopram (CELEXA) 20 MG tablet Take 1 tablet (20 mg total) by mouth daily. 30 tablet 2   clindamycin (CLEOCIN) 300 MG capsule Take 1 capsule (300 mg total) by  mouth 4 (four) times daily for 7 days. 28 capsule 0   desvenlafaxine (PRISTIQ) 100 MG 24 hr tablet Take 100 mg by mouth daily.     diclofenac Sodium (VOLTAREN) 1 % GEL Apply 2 g topically 3 (three) times daily as needed. For pain. 100 g 0   Empagliflozin-metFORMIN HCl ER 25-1000 MG TB24 Take 1 tablet by mouth daily with breakfast.     EYLEA 2 MG/0.05ML SOSY      FLUoxetine (PROZAC) 40 MG capsule Take 40 mg by mouth daily.     glucose blood test strip Use as instructed 300 each 0   Insulin Pen Needle 32G X 4 MM MISC Used to give insulin injections twice daily. 100 each 11   insulin regular human CONCENTRATED (HUMULIN R) 500 UNIT/ML injection Inject 30 Units into the skin 3 (three) times daily with meals. 30 U BID     Lido-Capsaicin-Men-Methyl Sal 0.5-0.035-5-20 % PTCH Apply 1 patch to the skin every 8 hours as needed for pain. Rotate sites of patch. 30 patch 0   mupirocin ointment (BACTROBAN) 2 % Apply 1 application topically 2 (two) times daily. 22 g 0   OXYGEN Inhale 2-4 L into the lungs 2 (two) times daily as needed (shortness of breath).     pregabalin (LYRICA) 300 MG capsule Take 1 capsule (300 mg total) by mouth 2 (two) times daily. For nerve pain. 180 capsule 0   ReliOn Ultra Thin Lancets 30G MISC Use as directed for blood sugar checks. 300 each 0   rOPINIRole (REQUIP) 1 MG tablet TAKE 1 TABLET BY MOUTH EVERY DAY AT BEDTIME FOR  RESTLESS  LEG 90 tablet 0   rosuvastatin (CRESTOR) 20 MG tablet Take 1 tablet (20 mg total) by mouth daily. For cholesterol. [place]  tablet 3   spironolactone (ALDACTONE) 50 MG tablet Take 50 mg by mouth daily.     tobramycin (TOBREX) 0.3 % ophthalmic solution 1 drop 4 (four) times daily.     topiramate (TOPAMAX) 50 MG tablet Take 1 tablet (50 mg total) by mouth 2 (two) times daily. For headache prevention. 180 tablet 1   traMADol (ULTRAM) 50 MG tablet Take 1 tablet (50 mg total) by mouth every 8 (eight) hours as needed for severe pain. 15 tablet 0   traZODone (DESYREL)  100 MG tablet Take 200 mg by mouth at bedtime.     UBRELVY 50 MG TABS Take by mouth.     ULTICARE MINI PEN NEEDLES 31G X 6 MM MISC USE AS DIRECTED 3 TIMES DAILY 100 each 0   Insulin Syringe-Needle U-100 (GLOBAL INJECT EASE INSULIN SYR) 30G X 1/2" 1 ML MISC See admin instructions.     Desvenlafaxine ER 100 MG TB24 Take 1 tablet by mouth every morning.     mupirocin ointment (BACTROBAN) 2 % Apply 1 application topically 2 (two) times daily. Apply to the affected area 2 times a day 22 g 3   Facility-Administered Medications Prior to Visit  Medication Dose Route Frequency Provider Last Rate Last Admin   lidocaine-EPINEPHrine (XYLOCAINE W/EPI) 2 %-1:100000 (with pres) injection 1.5 mL  1.5 mL Intradermal Once Michela Pitcher, NP        Allergies  Allergen Reactions   Adhesive [Tape] Rash and Other (See Comments)    TAKES OFF THE SKIN (CERTAIN MEDICAL TAPES DO THIS!!)   Metoprolol Shortness Of Breath    Occurrence of shortness of breath after 3 days   Montelukast Shortness Of Breath   Morphine Sulfate Anaphylaxis, Shortness Of Breath and Nausea And Vomiting    Swollen Throat - Able to tolerate dilaudid   Penicillins Anaphylaxis, Hives and Shortness Of Breath    Throat swells Has patient had a PCN reaction causing immediate rash, facial/tongue/throat swelling, SOB or lightheadedness with hypotension: Yes Has patient had a PCN reaction causing severe rash involving mucus membranes or skin necrosis: No Has patient had a PCN reaction that required hospitalization: Yes Has patient had a PCN reaction occurring within the last 10 years: No If all of the above answers are "NO", then may proceed with Cephalosporin use.    Diltiazem Swelling   Gabapentin Swelling   Midodrine     Lightheaded and falling down    ROS Review of Systems  Constitutional:  Negative for chills and fever.  Respiratory:  Negative for cough and shortness of breath.   Gastrointestinal:  Negative for abdominal pain.  Skin:   Positive for color change and wound.  Hematological:  Negative for adenopathy. Bruises/bleeds easily.     Objective:    Physical Exam Vitals and nursing note reviewed.  Constitutional:      Appearance: Normal appearance.  Abdominal:     General: Bowel sounds are normal.     Palpations: Abdomen is soft. There is no mass.     Tenderness: There is abdominal tenderness.  Skin:    Findings: Bruising, erythema and rash present.          Comments: Hematoma 16cm x 10cm circled for patient to monitor  Abscess on posterior scalp is 5cm x4cm  Neurological:     Mental Status: She is alert.       BP 112/68    Pulse 72    Temp (!) 97.4 F (36.3 C)    Resp 18  Ht 5' 4"  (1.626 m)    Wt 220 lb 6 oz (100 kg)    LMP  (LMP Unknown)    SpO2 97%    BMI 37.83 kg/m  Wt Readings from Last 3 Encounters:  07/13/21 220 lb 6 oz (100 kg)  07/09/21 214 lb (97.1 kg)  06/29/21 213 lb (96.6 kg)     Health Maintenance Due  Topic Date Due   Pneumococcal Vaccine 66-67 Years old (3 - PPSV23 if available, else PCV20) 05/11/2017   OPHTHALMOLOGY EXAM  08/29/2020   HEMOGLOBIN A1C  06/03/2021   TETANUS/TDAP  06/14/2021    There are no preventive care reminders to display for this patient.  Lab Results  Component Value Date   TSH 0.66 10/01/2020   Lab Results  Component Value Date   WBC 13.1 (H) 06/23/2021   HGB 13.9 06/23/2021   HCT 41.0 06/23/2021   MCV 84.7 06/23/2021   PLT 197.0 06/23/2021   Lab Results  Component Value Date   NA 135 05/12/2021   K 4.6 05/12/2021   CO2 27 05/12/2021   GLUCOSE 317 (H) 05/12/2021   BUN 25 (H) 05/12/2021   CREATININE 1.04 (H) 05/12/2021   BILITOT 0.8 05/12/2021   ALKPHOS 191 (H) 05/12/2021   AST 14 (L) 05/12/2021   ALT 18 05/12/2021   PROT 8.6 (H) 05/12/2021   ALBUMIN 3.8 05/12/2021   CALCIUM 9.1 05/12/2021   ANIONGAP 10 05/12/2021   GFR 52.28 (L) 12/01/2020   Lab Results  Component Value Date   CHOL 150 05/15/2020   Lab Results  Component  Value Date   HDL 24.30 (L) 05/15/2020   Lab Results  Component Value Date   LDLCALC 98 09/14/2018   Lab Results  Component Value Date   TRIG 284.0 (H) 05/15/2020   Lab Results  Component Value Date   CHOLHDL 6 05/15/2020   Lab Results  Component Value Date   HGBA1C 10.6 (A) 12/01/2020      Assessment & Plan:   Problem List Items Addressed This Visit       Other   Abscess    Abscess has been minimally responsive to oral antibiotics.  Patient is established or has been established in a wound clinic in touch base with patient's primary care provider and will rerefer her to wound clinic for further evaluation patient may require infectious disease consultation as well pending urgent referral.  Patient will continue clindamycin 300 mg 4 times daily for time being.      Relevant Orders   Ambulatory referral to Wound Clinic   Hematoma - Primary    Patient has rather large hematoma to right lower abdomen lateral to umbilicus slightly superior.  We will do conservative treatment inclusive of cool compresses avoiding that side of abdomen for injections of medications.  Did circle with patient and gauge whether is getting larger or not if it continues to get bigger anemia evaluate with imaging.  Pending CBC with differential in office.      Relevant Orders   CBC with Differential/Platelet    No orders of the defined types were placed in this encounter.   Follow-up: No follow-ups on file.    Romilda Garret, NP

## 2021-07-13 NOTE — Assessment & Plan Note (Signed)
Abscess has been minimally responsive to oral antibiotics.  Patient is established or has been established in a wound clinic in touch base with patient's primary care provider and will rerefer her to wound clinic for further evaluation patient may require infectious disease consultation as well pending urgent referral.  Patient will continue clindamycin 300 mg 4 times daily for time being.

## 2021-07-13 NOTE — Assessment & Plan Note (Signed)
Patient has rather large hematoma to right lower abdomen lateral to umbilicus slightly superior.  We will do conservative treatment inclusive of cool compresses avoiding that side of abdomen for injections of medications.  Did circle with patient and gauge whether is getting larger or not if it continues to get bigger anemia evaluate with imaging.  Pending CBC with differential in office.

## 2021-07-14 ENCOUNTER — Telehealth: Payer: Self-pay | Admitting: Primary Care

## 2021-07-14 NOTE — Telephone Encounter (Signed)
Pt called stating that she had an appt with Matt on yesterday. Pt states that she got her blood work back and don't understand it. Pt would like a call back to discuss. Please advise.

## 2021-07-14 NOTE — Telephone Encounter (Signed)
Patient still trying to get a reading of lab results, advised that someone will have to return the phone call.  Pt also had a question about a referral that was supposed to be put in, please advise- [phone]

## 2021-07-14 NOTE — Telephone Encounter (Signed)
Patient advised that provider has not had a chance to look at lab results and will get back with her about his recommendations once it is reviewed.

## 2021-07-14 NOTE — Telephone Encounter (Signed)
Called patient to discuss, left message to call back

## 2021-07-15 ENCOUNTER — Telehealth: Payer: Self-pay | Admitting: Primary Care

## 2021-07-15 ENCOUNTER — Other Ambulatory Visit: Payer: Self-pay

## 2021-07-15 ENCOUNTER — Other Ambulatory Visit: Payer: Self-pay | Admitting: Primary Care

## 2021-07-15 ENCOUNTER — Telehealth: Payer: Medicare Other

## 2021-07-15 ENCOUNTER — Encounter: Payer: BC Managed Care – PPO | Attending: Physician Assistant | Admitting: Physician Assistant

## 2021-07-15 DIAGNOSIS — L0291 Cutaneous abscess, unspecified: Secondary | ICD-10-CM

## 2021-07-15 DIAGNOSIS — I10 Essential (primary) hypertension: Secondary | ICD-10-CM | POA: Diagnosis not present

## 2021-07-15 DIAGNOSIS — L02811 Cutaneous abscess of head [any part, except face]: Secondary | ICD-10-CM | POA: Diagnosis not present

## 2021-07-15 DIAGNOSIS — A4902 Methicillin resistant Staphylococcus aureus infection, unspecified site: Secondary | ICD-10-CM

## 2021-07-15 DIAGNOSIS — B370 Candidal stomatitis: Secondary | ICD-10-CM | POA: Diagnosis not present

## 2021-07-15 DIAGNOSIS — D0581 Other specified type of carcinoma in situ of right breast: Secondary | ICD-10-CM | POA: Diagnosis not present

## 2021-07-15 DIAGNOSIS — Z09 Encounter for follow-up examination after completed treatment for conditions other than malignant neoplasm: Secondary | ICD-10-CM | POA: Diagnosis not present

## 2021-07-15 DIAGNOSIS — B3789 Other sites of candidiasis: Secondary | ICD-10-CM | POA: Diagnosis not present

## 2021-07-15 DIAGNOSIS — L739 Follicular disorder, unspecified: Secondary | ICD-10-CM

## 2021-07-15 NOTE — Telephone Encounter (Signed)
Pt called in needing to speak with Clark's assistant about her getting a pic line for liquid antibiotics. Please advise pt at [phone].

## 2021-07-15 NOTE — Telephone Encounter (Signed)
Left message to return call to our office.  

## 2021-07-15 NOTE — Telephone Encounter (Signed)
Spoke with wound center PA who recommended general surgery consult for continued wound to head and now abdomen.   Agree and will place referral now. Please notify patient

## 2021-07-15 NOTE — Telephone Encounter (Signed)
Patient has an appointment with wound center today 07/15/21. Lab result was sent to patient via mychart

## 2021-07-15 NOTE — Telephone Encounter (Signed)
Did she go to the wound center today?  If she feels that she needs a PICC line for her infection then she needs to go to the ED.

## 2021-07-15 NOTE — Telephone Encounter (Signed)
Walled patient states that she has wound care today. States that she is getting infection everywhere. She feels weak and was told that she needed to have line placed for antibiotics. States that something is wrong with her body. All she is doing is sleeping she is having issues with eating. She feels like the "spots" in mouth are worse.

## 2021-07-16 ENCOUNTER — Emergency Department
Admission: EM | Admit: 2021-07-16 | Discharge: 2021-07-16 | Discharge status: Against Medical Advice | Payer: Medicare Other

## 2021-07-16 DIAGNOSIS — Z743 Need for continuous supervision: Secondary | ICD-10-CM | POA: Diagnosis not present

## 2021-07-16 DIAGNOSIS — L0390 Cellulitis, unspecified: Secondary | ICD-10-CM | POA: Diagnosis not present

## 2021-07-16 DIAGNOSIS — R739 Hyperglycemia, unspecified: Secondary | ICD-10-CM | POA: Diagnosis not present

## 2021-07-16 DIAGNOSIS — R6889 Other general symptoms and signs: Secondary | ICD-10-CM | POA: Diagnosis not present

## 2021-07-16 NOTE — Progress Notes (Signed)
[place], TRAUGHBER ([id number]) Visit Report for 07/15/2021 Allergy List Details Patient Name: [name], [name] Date of Service: 07/15/2021 2:00 PM Medical Record Number: [id number] Patient Account Number: [id number] Date of Birth/Sex: 06/17/1971 (51 y.o. F) Treating RN: Cornell Barman Primary Care Camira Geidel: Alma Friendly Other Clinician: Referring Mayreli Alden: Karl Ito Treating Alvey Brockel/Extender: Skipper Cliche in Treatment: 0 Allergies Active Allergies penicillin Reaction: rash Severity: Moderate Singulair Reaction: tongue swelling Severity: Severe metoprolol morphine Reaction: face swelling, unable to swallow Severity: Severe prednisone Reaction: throat swelling Severity: Severe diltiazem Allergy Notes Electronic Signature(s) Signed: 07/16/2021 5:16:08 PM By: Gretta Cool, BSN, RN, CWS, Kim RN, BSN Entered By: Gretta Cool, BSN, RN, CWS, Kim on 07/15/2021 14:15:20 [name] ([id number]) -------------------------------------------------------------------------------- Arrival Information Details Patient Name: [name] Date of Service: 07/15/2021 2:00 PM Medical Record Number: [id number] Patient Account Number: [id number] Date of Birth/Sex: Dec 07, 1970 (51 y.o. F) Treating RN: Cornell Barman Primary Care Karys Meckley: Alma Friendly Other Clinician: Referring Alizey Noren: Karl Ito Treating Jahniya Duzan/Extender: Skipper Cliche in Treatment: 0 Visit Information Patient Arrived: Ambulatory Arrival Time: 14:09 Accompanied By: mother in law Transfer Assistance: None Patient Identification Verified: Yes Secondary Verification Process Completed: Yes Patient Has Alerts: Yes Patient Alerts: Patient on Blood Thinner Aspirin 81mg  and 325 mg Type II Diabetic History Since Last Visit Electronic Signature(s) Signed: 07/16/2021 5:16:08 PM By: Gretta Cool, BSN, RN, CWS, Kim RN, BSN Entered By: Gretta Cool, BSN, RN, CWS, Kim on 07/15/2021 14:13:12 [name]  ([id number]) -------------------------------------------------------------------------------- Clinic Level of Care Assessment Details Patient Name: [name] Date of Service: 07/15/2021 2:00 PM Medical Record Number: [id number] Patient Account Number: [id number] Date of Birth/Sex: 07/23/1970 (51 y.o. F) Treating RN: Cornell Barman Primary Care Delton Stelle: Alma Friendly Other Clinician: Referring Jaimen Melone: Karl Ito Treating Camilah Spillman/Extender: Skipper Cliche in Treatment: 0 Clinic Level of Care Assessment Items TOOL 2 Quantity Score []  - Use when only an EandM is performed on the INITIAL visit 0 ASSESSMENTS - Nursing Assessment / Reassessment X - General Physical Exam (combine w/ comprehensive assessment (listed just below) when performed on new 1 20 pt. evals) X- 1 25 Comprehensive Assessment (HX, ROS, Risk Assessments, Wounds Hx, etc.) ASSESSMENTS - Wound and Skin Assessment / Reassessment []  - Simple Wound Assessment / Reassessment - one wound 0 []  - 0 Complex Wound Assessment / Reassessment - multiple wounds []  - 0 Dermatologic / Skin Assessment (not related to wound area) ASSESSMENTS - Ostomy and/or Continence Assessment and Care []  - Incontinence Assessment and Management 0 []  - 0 Ostomy Care Assessment and Management (repouching, etc.) PROCESS - Coordination of Care X - Simple Patient / Family Education for ongoing care 1 15 []  - 0 Complex (extensive) Patient / Family Education for ongoing care X- 1 10 Staff obtains Programmer, systems, Records, Test Results / Process Orders []  - 0 Staff telephones HHA, Nursing Homes / Clarify orders / etc []  - 0 Routine Transfer to another Facility (non-emergent condition) []  - 0 Routine Hospital Admission (non-emergent condition) X- 1 15 New Admissions / Biomedical engineer / Ordering NPWT, Apligraf, etc. []  - 0 Emergency Hospital Admission (emergent condition) X- 1 10 Simple Discharge Coordination []  - 0 Complex (extensive)  Discharge Coordination PROCESS - Special Needs []  - Pediatric / Minor Patient Management 0 []  - 0 Isolation Patient Management []  - 0 Hearing / Language / Visual special needs []  - 0 Assessment of Community assistance (transportation, D/C planning, etc.) []  - 0 Additional assistance / Altered mentation []  - 0 Support Surface(s) Assessment (bed, cushion, seat, etc.) INTERVENTIONS - Wound Cleansing / Measurement []  -  Wound Imaging (photographs - any number of wounds) 0 []  - 0 Wound Tracing (instead of photographs) []  - 0 Simple Wound Measurement - one wound []  - 0 Complex Wound Measurement - multiple wounds Crocket, Amulya ([id number]) []  - 0 Simple Wound Cleansing - one wound []  - 0 Complex Wound Cleansing - multiple wounds INTERVENTIONS - Wound Dressings []  - Small Wound Dressing one or multiple wounds 0 []  - 0 Medium Wound Dressing one or multiple wounds []  - 0 Large Wound Dressing one or multiple wounds []  - 0 Application of Medications - injection INTERVENTIONS - Miscellaneous []  - External ear exam 0 []  - 0 Specimen Collection (cultures, biopsies, blood, body fluids, etc.) []  - 0 Specimen(s) / Culture(s) sent or taken to Lab for analysis []  - 0 Patient Transfer (multiple staff / Civil Service fast streamer / Similar devices) []  - 0 Simple Staple / Suture removal (25 or less) []  - 0 Complex Staple / Suture removal (26 or more) []  - 0 Hypo / Hyperglycemic Management (close monitor of Blood Glucose) []  - 0 Ankle / Brachial Index (ABI) - do not check if billed separately Has the patient been seen at the hospital within the last three years: Yes Total Score: 95 Level Of Care: New/Established - Level 3 Electronic Signature(s) Signed: 07/16/2021 5:16:08 PM By: Gretta Cool, BSN, RN, CWS, Kim RN, BSN Entered By: Gretta Cool, BSN, RN, CWS, Kim on 07/15/2021 16:15:37 [name] ([id number]) -------------------------------------------------------------------------------- Encounter Discharge  Information Details Patient Name: [name] Date of Service: 07/15/2021 2:00 PM Medical Record Number: [id number] Patient Account Number: [id number] Date of Birth/Sex: [birth year] (51 y.o. F) Treating RN: Cornell Barman Primary Care Maelyn Berrey: Alma Friendly Other Clinician: Referring Nizar Cutler: Karl Ito Treating Josepha Barbier/Extender: Skipper Cliche in Treatment: 0 Encounter Discharge Information Items Discharge Condition: Stable Ambulatory Status: Ambulatory Discharge Destination: Home Transportation: Private Auto Accompanied By: wife Schedule Follow-up Appointment: Yes Clinical Summary of Care: Electronic Signature(s) Signed: 07/16/2021 5:16:08 PM By: Gretta Cool, BSN, RN, CWS, Kim RN, BSN Entered By: Gretta Cool, BSN, RN, CWS, Kim on 07/15/2021 16:17:04 [name] ([id number]) -------------------------------------------------------------------------------- Lower Extremity Assessment Details Patient Name: [name] Date of Service: 07/15/2021 2:00 PM Medical Record Number: [id number] Patient Account Number: [id number] Date of Birth/Sex: 05/05/1971 (50 y.o. F) Treating RN: Cornell Barman Primary Care Latreece Mochizuki: Alma Friendly Other Clinician: Referring Consuelo Suthers: Karl Ito Treating Mayara Paulson/Extender: Skipper Cliche in Treatment: 0 Electronic Signature(s) Signed: 07/16/2021 5:16:08 PM By: Gretta Cool, BSN, RN, CWS, Kim RN, BSN Entered By: Gretta Cool, BSN, RN, CWS, Kim on 07/15/2021 14:29:28 [name] ([id number]) -------------------------------------------------------------------------------- Multi Wound Chart Details Patient Name: [name] Date of Service: 07/15/2021 2:00 PM Medical Record Number: [id number] Patient Account Number: [id number] Date of Birth/Sex: [birth year] (50 y.o. F) Treating RN: Cornell Barman Primary Care Joyia Riehle: Alma Friendly Other Clinician: Referring Jermayne Sweeney: Karl Ito Treating Saray Capasso/Extender: Skipper Cliche in Treatment: 0 Vital Signs Height(in):  64 Pulse(bpm): 74 Weight(lbs): 242 Blood Pressure(mmHg): 141/68 Body Mass Index(BMI): 38 Temperature(F): 98.0 Respiratory Rate(breaths/min): 16 Wound Assessments Treatment Notes Electronic Signature(s) Signed: 07/16/2021 5:16:08 PM By: Gretta Cool, BSN, RN, CWS, Kim RN, BSN Entered By: Gretta Cool, BSN, RN, CWS, Kim on 07/15/2021 16:14:16 [name] ([id number]) -------------------------------------------------------------------------------- Multi-Disciplinary Care Plan Details Patient Name: [name] Date of Service: 07/15/2021 2:00 PM Medical Record Number: [id number] Patient Account Number: [id number] Date of Birth/Sex: 1970/12/12 (50 y.o. F) Treating RN: Cornell Barman Primary Care Odaly Peri: Alma Friendly Other Clinician: Referring Shanty Ginty: Karl Ito Treating Myers Tutterow/Extender: Skipper Cliche in Treatment: 0 Active Inactive Electronic Signature(s) Signed: 07/16/2021 5:16:08 PM By: Gretta Cool,  BSN, RN, CWS, Kim RN, BSN Entered By: Gretta Cool, BSN, RN, CWS, Kim on 07/15/2021 16:14:07 [name] ([id number]) -------------------------------------------------------------------------------- Non-Wound Condition Assessment Details Patient Name: [name] Date of Service: 07/15/2021 2:00 PM Medical Record Number: [id number] Patient Account Number: [id number] Date of Birth/Sex: 10-08-1970 (50 y.o. F) Treating RN: Cornell Barman Primary Care Deeana Atwater: Alma Friendly Other Clinician: Referring Tiger Spieker: Karl Ito Treating Jennae Hakeem/Extender: Skipper Cliche in Treatment: 0 Non-Wound Condition: Condition: Soft Tissue Infection Location: Head Side: Photos Electronic Signature(s) Signed: 07/15/2021 4:57:18 PM By: Gretta Cool, BSN, RN, CWS, Kim RN, BSN Entered By: Gretta Cool, BSN, RN, CWS, Kim on 07/15/2021 16:57:18 [place], [name] ([id number]) -------------------------------------------------------------------------------- Non-Wound Condition Assessment Details Patient Name: [name] Date of  Service: 07/15/2021 2:00 PM Medical Record Number: [id number] Patient Account Number: [id number] Date of Birth/Sex: 1970-10-19 (50 y.o. F) Treating RN: Cornell Barman Primary Care Abuk Selleck: Alma Friendly Other Clinician: Referring Becky Berberian: Karl Ito Treating Angelise Petrich/Extender: Skipper Cliche in Treatment: 0 Non-Wound Condition: Condition: Other Dermatologic Condition Location: Abdomen Side: Right Photos Electronic Signature(s) Signed: 07/15/2021 4:58:40 PM By: Gretta Cool, BSN, RN, CWS, Kim RN, BSN Entered By: Gretta Cool, BSN, RN, CWS, Kim on 07/15/2021 16:58:40 [name] ([id number]) -------------------------------------------------------------------------------- Pain Assessment Details Patient Name: [name] Date of Service: 07/15/2021 2:00 PM Medical Record Number: [id number] Patient Account Number: [id number] Date of Birth/Sex: Sep 30, 1970 (50 y.o. F) Treating RN: Cornell Barman Primary Care Cadience Bradfield: Alma Friendly Other Clinician: Referring [place] Mccuen: Karl Ito Treating Draiden Mirsky/Extender: Skipper Cliche in Treatment: 0 Active Problems Location of Pain Severity and Description of Pain Patient Has Paino Yes Site Locations Pain Location: Pain in Ulcers Duration of the Pain. Constant / Intermittento Constant Rate the pain. Current Pain Level: 8 Character of Pain Describe the Pain: Burning, Other: stinging Pain Management and Medication Current Pain Management: Electronic Signature(s) Signed: 07/16/2021 5:16:08 PM By: Gretta Cool, BSN, RN, CWS, Kim RN, BSN Entered By: Gretta Cool, BSN, RN, CWS, Kim on 07/15/2021 14:13:53 [name] ([id number]) -------------------------------------------------------------------------------- Patient/Caregiver Education Details Patient Name: [name] Date of Service: 07/15/2021 2:00 PM Medical Record Number: [id number] Patient Account Number: [id number] Date of Birth/Gender: 1971/05/07 (50 y.o. F) Treating RN: Cornell Barman Primary Care  Physician: Alma Friendly Other Clinician: Referring Physician: Karl Ito Treating Physician/Extender: Skipper Cliche in Treatment: 0 Education Assessment Education Provided To: Patient Education Topics Provided Wound/Skin Impairment: Handouts: Caring for Your Ulcer Methods: Demonstration, Explain/Verbal Responses: State content correctly Electronic Signature(s) Signed: 07/16/2021 5:16:08 PM By: Gretta Cool, BSN, RN, CWS, Kim RN, BSN Entered By: Gretta Cool, BSN, RN, CWS, Kim on 07/15/2021 [place], [place] ([id number]) -------------------------------------------------------------------------------- Vitals Details Patient Name: [name] Date of Service: 07/15/2021 2:00 PM Medical Record Number: [id number] Patient Account Number: [id number] Date of Birth/Sex: 11-04-1970 (50 y.o. F) Treating RN: Cornell Barman Primary Care Dahna Hattabaugh: Alma Friendly Other Clinician: Referring Mckay Tegtmeyer: Karl Ito Treating Jonay Hitchcock/Extender: Skipper Cliche in Treatment: 0 Vital Signs Time Taken: 14:13 Temperature (F): 98.0 Height (in): 64 Pulse (bpm): 74 Weight (lbs): 223 Respiratory Rate (breaths/min): 16 Body Mass Index (BMI): 38.3 Blood Pressure (mmHg): 141/68 Reference Range: 80 - 120 mg / dl Electronic Signature(s) Signed: 07/16/2021 5:16:08 PM By: Gretta Cool, BSN, RN, CWS, Kim RN, BSN Entered By: Gretta Cool, BSN, RN, CWS, Kim on 07/15/2021 14:14:42

## 2021-07-16 NOTE — Telephone Encounter (Signed)
I'm sorry to hear this!  Recommend hydrating well with water, eat very bland food. If vomiting has persisted since this morning then she may need ED evaluation.

## 2021-07-16 NOTE — ED Triage Notes (Signed)
First Nurse Note:  Arrives via [facility] for c/o weakness, a new 'tumor growing on the top of her head' and bruising to RLQ pain. Vs wnl.  CBG:  297.

## 2021-07-16 NOTE — Telephone Encounter (Signed)
Noted.  Agree with ED. Will await notes.

## 2021-07-16 NOTE — Telephone Encounter (Signed)
Called patient states that she feels much worse. She feels like she is just getting worse. She has agreed to go to ED to get fluids and possible antibiotics.
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
## 2021-07-16 NOTE — Telephone Encounter (Signed)
Patient called to follow up after call from yesterday. Gave information while on phone patient started vomiting. States she just woke up about 45 minutes ago and has been sick throwing up. She does not have any fever or abdominal pain. No one else in the home is sick.

## 2021-07-16 NOTE — Progress Notes (Signed)
HEAVENLEIGH, PETRUZZI ([id number]) Visit Report for 07/15/2021 Chief Complaint Document Details Patient Name: Nancy, Foster Date of Service: 07/15/2021 2:00 PM Medical Record Number: [id number] Patient Account Number: [id number] Date of Birth/Sex: [birth year] (51 y.o. F) Treating RN: Cornell Barman Primary Care Provider: Alma Friendly Other Clinician: Referring Provider: Karl Ito Treating Provider/Extender: Skipper Cliche in Treatment: 0 Information Obtained from: Patient Chief Complaint [place], abdominal contusion, and scalp abscess Electronic Signature(s) Signed: 07/15/2021 3:11:09 PM By: Worthy Keeler PA-C Entered By: Worthy Keeler on 07/15/2021 15:11:08 Jambor, [name] ([id number]) -------------------------------------------------------------------------------- HPI Details Patient Name: [name] Date of Service: 07/15/2021 2:00 PM Medical Record Number: [id number] Patient Account Number: [id number] Date of Birth/Sex: 1971-06-03 (50 y.o. F) Treating RN: Cornell Barman Primary Care Provider: Alma Friendly Other Clinician: Referring Provider: Karl Ito Treating Provider/Extender: Skipper Cliche in Treatment: 0 History of Present Illness HPI Description: ADMISSION 06/19/2019 Patient is a 51 year old woman who is accompanied by her husband. She has severe diabetic peripheral neuropathy which is left her minimally ambulatory. She spends most of the time in a wheelchair. She also has a history of chronic lower extremity edema history of left leg DVT. She states that on 2 separate occasions roughly a month ago she fell with lacerations on the bilateral anterior tibial areas. She was seen by primary care on 06/04/2019 diagnosed with cellulitis of the left leg put on Bactrim DS. Also noted to have hand wounds with a question of referral to hand surgery. She was also sent to our clinic at the same time. The patient's husband is doing the dressings. He has been using silver alginate that  was supplied by home health. He is done a really good job the area on the left leg is actually healed only a small superficial area on the right Somewhere in the in the last 2 weeks she gives a bizarre history of going to wash nail polish off the fingers of both hands. She apparently did not realize that her son had put Clorox in the vinegar jar that they used to clean the countertop. She ended up burning her hands. The patient has a wound on the PIP of the left third finger, PIP of the right second finger and the lateral part of the right third finger Past medical history includes COPD, obstructive sleep apnea on nocturnal O2, type 2 diabetes with peripheral neuropathy, retinopathy, chronic diastolic heart failure coronary artery disease, severe diabetic neuropathy type 2 diabetes apparently diagnosed at age 22 on insulin, COPD, continued tobacco abuse, left leg deep DVT, hypertension, TIAs, seizure disorders, asthma The patient is actually had arterial studies in February of this year. ABIs bilaterally were 1.02 and TBI's bilaterally at 0.6. Waveforms were triphasic and biphasic. She was not felt to have significant arterial disease. 12/23; patient readmitted the clinic last week. She has predominantly venous insufficiency wounds on the right medial lower leg. She also had open areas on the fingers of both hands which happened in a very bizarre way. She has almost complete healing on the right medial calf. The area on the left third finger is healed. She still has 2 open wounds on the medial aspect of the PIP of the right second finger and the dorsal aspect of the right PIP of the third finger. Of these 2 wounds the PIP of the third finger wound is painful and deeper 12/30; the areas on her right medial calf are closed. She likely has some degree of chronic venous insufficiency and lymphedema. Also very xerotic  skin which I have recommended a moisturizing. I did not bring up the issue of  compression stockings The areas we are still looking out are on the PIP of the right third finger and the medial part of the PIP of the right second finger. The second finger area is just about closed. The problem is over the PIP of the right third finger. This does not look infected and she is completing her antibiotics from last week. However this is a deep wound at the base of this there is either tendon or periosteum. She she has some healthy granulation but we are not seeing that close over the bottom part of the wound. We are probably going to need to immobilize the finger somewhat so that she does not continuously pull the tissue apart 07/17/2019; right medial calf remains closed and the right second finger is closed. The sole problem here is an area over the PIP of the third finger. These wounds were initially advertised as burn injuries that happened in a very bizarre way [please see my initial discussion on this]. I gave her doxycycline last week because of some erythema around the wound however she did not tolerate this very well because of nausea. She stopped this within the last day or 2. we have been using silver collagen 1/13; the only wound we are looking at is the right third finger over the PIP. We are using endoform. X-ray ordered last time showed no underlying bone abnormalities 1/20; right third finger over the PIP. Still debris at the surface we have been using endoform. We are making some improvements in surface area and volume but still requiring debridement. 1/27; right third finger over the PIP. Still debris on the surface of the wound and maceration. We have been using endoform. This wound initially went to the periosteum and it certainly is off that but is certainly stalled over the last 2 or 3 weeks 2/10; right third finger over the PIP. We switch to Iodoflex last time but she says this caused extreme erythema and blistering and they changed back to endoform. This is a  wound that originally went to the periosteum. This is improved since her admission but really it is stalled. She missed her appointment last week but she states she has been using endoform. She complains of increasing pain in the finger that is making it difficult for her to sleep at times 2/17; right third finger over the PIP. This is a difficult wound small punched out area. Culture I did last week showed methicillin sensitive staph aureus however she is allergic to penicillin [widespread rash as a child] she has not had cephalosporins. Multiple drug interactions with quinolones and I was concerned about trimethoprim sulfamethoxazole in somebody with ACE inhibitors and spironolactone. I prescribed doxycycline. This was 2 days ago. She comes in today without the antibiotic saying that it caused mental status changes fatigue and diarrhea the last time she was on this. 2/24; right third finger over the PIP. She is tolerating the doxycycline has another 3 days left. This is for the area on the PIP of the third finger on her right. She arrives in clinic today with a thick raised eschar over the PIP of the left third finger. I talked to her about this. The exact reason is unclear. She does smoke but denies it could be a burn injury. Her husband thinks this may be trauma pushing her wheelchair but the depth of this makes this somewhat unusual. She does  not appear to have any blood flow issues in either arm Readmission: 06/29/2020 upon evaluation today patient appears to be doing somewhat poorly in regard to her left lower extremity. She has a couple open areas here which appear to be venous in nature. One of them is a more recent trauma but nonetheless both are very superficial. She does have a lot of lymphedema/stasis type weeping from the wound opening which I think is preventing her from healing. She has been trying to work on this ALESHKA, CORNEY ([id number]) on her own but unfortunately just is not  getting the results she needs as far as getting the swelling under control to allow these wounds to heal. Currently the patient tells me as well that she has been undergoing treatment for breast cancer on the right. I think she has a left mastectomy as well. She also has COPD. Her husband is actually taking very good care of her and doing what he can at this point in time. Patient does have hospice coming out to see her as well. Readmission: 02/02/21 second married issues that she was having with cellulitis. She tells me currently that she was in the hospital for several days and subsequently it was during that time that she developed a wound on her left heel. Initially this was thought to just be a blister and she was told not to pop it. Subsequently upon getting home she notes that this started to worsen an opening drain. That's when she decided to contact the wound center as we've seen her previously and taking care of ulcerations for her. Upon evaluation today the patient actually showed signs of what appears to be a pressure injury stage III to the left heel. There does not appear to be any significant depth although there's just one spot that goes down to the subcutaneous layer were there some Slough noted there is a lot of blistered skin and callous tissue that is going to need to be removed help this to heal properly. The patient voiced understanding. Otherwise her medical history per above really has not changed intricately at this point. The patient was started on Doxy cycling two days ago 02/08/2021 upon evaluation today patient's wound actually showing signs of excellent improvement. I do not see any evidence of infection which is great news and after the debridement this actually seems to have done great over the past week. I am extremely pleased with what I see today. 9/7; patient presents for follow-up. Its been over a month since patient was last seen. She has been using Xeroform to the  wound daily. She denies signs of infection. She has no issues or complaints today. 04/08/2021 upon evaluation today patient appears to be doing well currently in regard to her wound. She has been making great progress and overall very pleased with how the Xeroform is doing. I think she is significantly smaller and overall headed in the appropriate direction. 04/29/2021 upon evaluation today patient actually appears to be doing excellent at this time. She has been tolerating the dressing changes without complication and overall I think that we are definitely headed in the right direction in fact this appears to be completely healed though she still needs to be cautious with her heel I do not believe that she is getting need to wear any specific dressings any longer. She is very pleased to hear that just an ABD pad with some roll gauze will suffice using a stretch net to secure in place. Readmission: 07/15/2020 upon  evaluation today patient presents for reevaluation here in the clinic last time I saw her was actually April 29, 2021. Today she comes in with a somewhat confusing story about what exactly is going on. She has an area over the abdominal region more towards the right it appears to be hematoma. It is starting to dissipate based on what I see. With that being said my biggest concern here is that she does not really know how or why this started. That is definitely of concern in general I did review the notes from a bowel her primary care in regard to what we are seeing here as well. The most recent provider she saw was Penelope Galas who is a Designer, jewellery previously she has seen Alinda Dooms who is also a Designer, jewellery. That is her normal primary care provider. Subsequently there was mention that she was supposed to be getting IV antibiotics by the patient to me today. With that being said that does not appear to be found in the notes nor something that was part of the plan. I did  actually end up calling the office in order to discuss what exactly was going on and try to determine where things stand and what needed to be done. This is primarily for an issue on her scalp which was stated to be an abscess area. However when talking with the patient's nurse practitioner today which was Alinda Dooms it was noted that both times this was opened with incision and drainage that there was very little expressed from it again today I do not really see any significant fluctuance. The patient does currently have breast cancer for which she is being treated she has a strong history of cancer personally as well in general. Nonetheless my biggest concern that I see today is that I am not certain if she needs to see general surgery or if potentially she needs to even have a biopsy of this area to see if there is indeed any issue here with cancer. Either way this is not really a wound nor a wound care situation and I discussed that with the patient as well as with the patient's nurse practitioner as well. As far as the hematoma is concerned again it seems to be resolving but definitely something needs to be kept a close eye on as far as that is concerned. With regard to the fact that she has had difficulty swallowing and issues with "sores in her mouth I am get a look at that as well since she is here today. Electronic Signature(s) Signed: 07/15/2021 5:19:19 PM By: Worthy Keeler PA-C Entered By: Worthy Keeler on 07/15/2021 17:19:19 Prichett, [name] ([id number]) -------------------------------------------------------------------------------- Physical Exam Details Patient Name: [name] Date of Service: 07/15/2021 2:00 PM Medical Record Number: [id number] Patient Account Number: [id number] Date of Birth/Sex: June 25, 1971 (50 y.o. F) Treating RN: Cornell Barman Primary Care Provider: Alma Friendly Other Clinician: Referring Provider: Karl Ito Treating Provider/Extender: Skipper Cliche in Treatment: 0 Constitutional Well-nourished and well-hydrated in no acute distress. Respiratory normal breathing without difficulty. Psychiatric this patient is able to make decisions and demonstrates good insight into disease process. Alert and Oriented x 3. pleasant and cooperative. Notes Upon inspection patient's wounds again on the abdominal area appear to be a hematoma which is resolving not not exactly sure where this came from history is nonrevealing. Secondly the area in question on the crown of her head appears to be very thickened I really do not feel a  lot of fluctuance which apparently after discussing with her primary care provider seems to be fairly common of the situation. Nonetheless I did discuss today that I feel like the patient probably needs to be seen by general surgery to see if there is anything that needs to be done here I really think there is much I can offer that has not already been attempted and primary care from a wound care perspective here this is really not a wound but definitely is something that needs to be addressed. Finally I did have a look at her mouth I did not have a otoscope but was able to get a light in just to have a glance of the back of the throat and the good news is that I do not see any really bad ulcerations the bad news is it does appear the patient has oral thrush and that she has been on doxycycline, clindamycin, and 1 other antibiotic that I am unsure of. It is not out of the question that she would potentially have this especially in light of the cancer treatment currently. Nonetheless I am going to give her something for this today. Electronic Signature(s) Signed: 07/15/2021 5:20:47 PM By: Worthy Keeler PA-C Entered By: Worthy Keeler on 07/15/2021 17:20:47 Champoux, [name] ([id number]) -------------------------------------------------------------------------------- Physician Orders Details Patient Name: [name] Date of Service: 07/15/2021 2:00 PM Medical Record Number: [id number] Patient Account Number: [id number] Date of Birth/Sex: 18-Nov-1970 (50 y.o. 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 She is good to make a referral for the patient to general surgery, Dr. Bary Castilla. I explained that I feel like this could potentially need to be biopsied and it sounds like when its been opened to try to drain previously that it is really not doing much in the way of draining. She is also on significant oral antibiotics which I think has led to the oral thrush which I am good to manage today. Follow-up Appointments o Other: - Patient will follow up with her primary care provider and the surgeon as directed. She only be seen here as needed if they require additional help on Our end. Patient Medications Allergies: Singulair, morphine, prednisone, penicillin, metoprolol, diltiazem Notifications Medication Indication Start End nystatin 07/15/2021 DOSE 5 - oral 100,000 unit/mL suspension - 5 ML suspension taken oral. Gargle and swish for 1 minute then swallow 4 times per day for 2 weeks Electronic Signature(s) Signed: 07/15/2021 3:26:24 PM By: Worthy Keeler PA-C Entered By: Worthy Keeler on 07/15/2021 15:26:23 Mcmann, [name] ([id number]) -------------------------------------------------------------------------------- Problem List Details Patient Name: [name] Date of Service: 07/15/2021 2:00 PM Medical Record  Number: [id number] Patient Account Number: [id number] Date of Birth/Sex: 1970/11/10 (50 y.o. F) Treating RN: Cornell Barman Primary Care Provider: Alma Friendly Other Clinician: Referring Provider: Karl Ito Treating Provider/Extender: Skipper Cliche in Treatment: 0 Active Problems ICD-10 Encounter Code Description Active Date MDM Diagnosis L02.811 Cutaneous abscess of head [any part, except face] 07/15/2021 No Yes B37.89 Other sites of candidiasis 07/15/2021 No Yes D05.81 Other specified type of carcinoma in situ of right  breast 07/15/2021 No Yes Inactive Problems Resolved Problems Electronic Signature(s) Signed: 07/15/2021 3:10:10 PM By: Worthy Keeler PA-C Entered By: Worthy Keeler on 07/15/2021 15:10:10 Wuebker, [name] ([id number]) -------------------------------------------------------------------------------- Progress Note Details Patient Name: [name] Date of Service: 07/15/2021 2:00 PM Medical Record Number: [id number] Patient Account Number: [id number] Date of Birth/Sex: 1971/05/31 (50 y.o. F) Treating RN: Cornell Barman Primary Care Provider: Alma Friendly Other Clinician: Referring Provider: Karl Ito Treating Provider/Extender: Skipper Cliche in Treatment: 0 Subjective Chief Complaint Information obtained from Patient Thrush, abdominal contusion, and scalp abscess History of Present Illness (HPI) ADMISSION 06/19/2019 Patient is a 51 year old woman who is accompanied by her husband. She has severe diabetic peripheral neuropathy which is left her minimally ambulatory. She spends most of the time in a wheelchair. She also has a history of chronic lower extremity edema history of left leg DVT. She states that on 2 separate occasions roughly a month ago she fell with lacerations on the bilateral anterior tibial areas. She was seen by primary care on 06/04/2019 diagnosed with cellulitis of the left leg put on Bactrim DS. Also noted to have hand wounds with a  question of referral to hand surgery. She was also sent to our clinic at the same time. The patient's husband is doing the dressings. He has been using silver alginate that was supplied by home health. He is done a really good job the area on the left leg is actually healed only a small superficial area on the right Somewhere in the in the last 2 weeks she gives a bizarre history of going to wash nail polish off the fingers of both hands. She apparently did not realize that her son had put Clorox in the vinegar jar that they used to clean the countertop. She ended up burning her hands. The patient has a wound on the PIP of the left third finger, PIP of the right second finger and the lateral part of the right third finger Past medical history includes COPD, obstructive sleep apnea on nocturnal O2, type 2 diabetes with peripheral neuropathy, retinopathy, chronic diastolic heart failure coronary artery disease, severe diabetic neuropathy type 2 diabetes apparently diagnosed at age 33 on insulin, COPD, continued tobacco abuse, left leg deep DVT, hypertension, TIAs, seizure disorders, asthma The patient is actually had arterial studies in February of this year. ABIs bilaterally were 1.02 and TBI's bilaterally at 0.6. Waveforms were triphasic and biphasic. She was not felt to have significant arterial disease. 12/23; patient readmitted the clinic last week. She has predominantly venous insufficiency wounds on the right medial lower leg. She also had open areas on the fingers of both hands which happened in a very bizarre way. She has almost complete healing on the right medial calf. The area on the left third finger is healed. She still has 2 open wounds on the medial aspect of the PIP of the right second finger and the dorsal aspect of the right PIP of the third finger. Of these 2 wounds the PIP of the third finger wound is painful and deeper 12/30; the areas on her right medial calf are closed. She  likely has some degree of chronic venous insufficiency and lymphedema. Also very xerotic skin which I have recommended a moisturizing. I did not bring up the issue of compression stockings The areas we are still looking out are on the PIP of the right third finger and the medial part of the PIP of the right second finger. The second finger area  is just about closed. The problem is over the PIP of the right third finger. This does not look infected and she is completing her antibiotics from last week. However this is a deep wound at the base of this there is either tendon or periosteum. She she has some healthy granulation but we are not seeing that close over the bottom part of the wound. We are probably going to need to immobilize the finger somewhat so that she does not continuously pull the tissue apart 07/17/2019; right medial calf remains closed and the right second finger is closed. The sole problem here is an area over the PIP of the third finger. These wounds were initially advertised as burn injuries that happened in a very bizarre way [please see my initial discussion on this]. I gave her doxycycline last week because of some erythema around the wound however she did not tolerate this very well because of nausea. She stopped this within the last day or 2. we have been using silver collagen 1/13; the only wound we are looking at is the right third finger over the PIP. We are using endoform. X-ray ordered last time showed no underlying bone abnormalities 1/20; right third finger over the PIP. Still debris at the surface we have been using endoform. We are making some improvements in surface area and volume but still requiring debridement. 1/27; right third finger over the PIP. Still debris on the surface of the wound and maceration. We have been using endoform. This wound initially went to the periosteum and it certainly is off that but is certainly stalled over the last 2 or 3 weeks 2/10;  right third finger over the PIP. We switch to Iodoflex last time but she says this caused extreme erythema and blistering and they changed back to endoform. This is a wound that originally went to the periosteum. This is improved since her admission but really it is stalled. She missed her appointment last week but she states she has been using endoform. She complains of increasing pain in the finger that is making it difficult for her to sleep at times 2/17; right third finger over the PIP. This is a difficult wound small punched out area. Culture I did last week showed methicillin sensitive staph aureus however she is allergic to penicillin [widespread rash as a child] she has not had cephalosporins. Multiple drug interactions with quinolones and I was concerned about trimethoprim sulfamethoxazole in somebody with ACE inhibitors and spironolactone. I prescribed doxycycline. This was 2 days ago. She comes in today without the antibiotic saying that it caused mental status changes fatigue and diarrhea the last time she was on this. 2/24; right third finger over the PIP. She is tolerating the doxycycline has another 3 days left. This is for the area on the PIP of the third finger on her right. She arrives in clinic today with a thick raised eschar over the PIP of the left third finger. I talked to her about this. The exact reason is unclear. She does smoke but denies it could be a burn injury. Her husband thinks this may be trauma pushing her wheelchair but the depth of this makes this somewhat unusual. She does not appear to have any blood flow issues in either arm Suell, Violia ([id number]) Readmission: 06/29/2020 upon evaluation today patient appears to be doing somewhat poorly in regard to her left lower extremity. She has a couple open areas here which appear to be venous in nature. One of them  is a more recent trauma but nonetheless both are very superficial. She does have a lot of  lymphedema/stasis type weeping from the wound opening which I think is preventing her from healing. She has been trying to work on this on her own but unfortunately just is not getting the results she needs as far as getting the swelling under control to allow these wounds to heal. Currently the patient tells me as well that she has been undergoing treatment for breast cancer on the right. I think she has a left mastectomy as well. She also has COPD. Her husband is actually taking very good care of her and doing what he can at this point in time. Patient does have hospice coming out to see her as well. Readmission: 02/02/21 second married issues that she was having with cellulitis. She tells me currently that she was in the hospital for several days and subsequently it was during that time that she developed a wound on her left heel. Initially this was thought to just be a blister and she was told not to pop it. Subsequently upon getting home she notes that this started to worsen an opening drain. That's when she decided to contact the wound center as we've seen her previously and taking care of ulcerations for her. Upon evaluation today the patient actually showed signs of what appears to be a pressure injury stage III to the left heel. There does not appear to be any significant depth although there's just one spot that goes down to the subcutaneous layer were there some Slough noted there is a lot of blistered skin and callous tissue that is going to need to be removed help this to heal properly. The patient voiced understanding. Otherwise her medical history per above really has not changed intricately at this point. The patient was started on Doxy cycling two days ago 02/08/2021 upon evaluation today patient's wound actually showing signs of excellent improvement. I do not see any evidence of infection which is great news and after the debridement this actually seems to have done great over the past  week. I am extremely pleased with what I see today. 9/7; patient presents for follow-up. Its been over a month since patient was last seen. She has been using Xeroform to the wound daily. She denies signs of infection. She has no issues or complaints today. 04/08/2021 upon evaluation today patient appears to be doing well currently in regard to her wound. She has been making great progress and overall very pleased with how the Xeroform is doing. I think she is significantly smaller and overall headed in the appropriate direction. 04/29/2021 upon evaluation today patient actually appears to be doing excellent at this time. She has been tolerating the dressing changes without complication and overall I think that we are definitely headed in the right direction in fact this appears to be completely healed though she still needs to be cautious with her heel I do not believe that she is getting need to wear any specific dressings any longer. She is very pleased to hear that just an ABD pad with some roll gauze will suffice using a stretch net to secure in place. Readmission: 07/15/2020 upon evaluation today patient presents for reevaluation here in the clinic last time I saw her was actually April 29, 2021. Today she comes in with a somewhat confusing story about what exactly is going on. She has an area over the abdominal region more towards the right it appears to  be hematoma. It is starting to dissipate based on what I see. With that being said my biggest concern here is that she does not really know how or why this started. That is definitely of concern in general I did review the notes from a bowel her primary care in regard to what we are seeing here as well. The most recent provider she saw was Penelope Galas who is a Designer, jewellery previously she has seen Alinda Dooms who is also a Designer, jewellery. That is her normal primary care provider. Subsequently there was mention that she was supposed  to be getting IV antibiotics by the patient to me today. With that being said that does not appear to be found in the notes nor something that was part of the plan. I did actually end up calling the office in order to discuss what exactly was going on and try to determine where things stand and what needed to be done. This is primarily for an issue on her scalp which was stated to be an abscess area. However when talking with the patient's nurse practitioner today which was Alinda Dooms it was noted that both times this was opened with incision and drainage that there was very little expressed from it again today I do not really see any significant fluctuance. The patient does currently have breast cancer for which she is being treated she has a strong history of cancer personally as well in general. Nonetheless my biggest concern that I see today is that I am not certain if she needs to see general surgery or if potentially she needs to even have a biopsy of this area to see if there is indeed any issue here with cancer. Either way this is not really a wound nor a wound care situation and I discussed that with the patient as well as with the patient's nurse practitioner as well. As far as the hematoma is concerned again it seems to be resolving but definitely something needs to be kept a close eye on as far as that is concerned. With regard to the fact that she has had difficulty swallowing and issues with "sores in her mouth I am get a look at that as well since she is here today. Patient History Information obtained from Patient. Allergies penicillin (Severity: Moderate, Reaction: rash), Singulair (Severity: Severe, Reaction: tongue swelling), metoprolol, morphine (Severity: Severe, Reaction: face swelling, unable to swallow), prednisone (Severity: Severe, Reaction: throat swelling), diltiazem Family History Cancer - Siblings, Diabetes - Siblings,Father,Mother, Heart Disease -  Siblings,Father,Mother, Hypertension - Mother,Father,Siblings, Lung Disease - Mother, Seizures - Mother, Stroke - Mother,Siblings,Father, No family history of Hereditary Spherocytosis, Kidney Disease, Thyroid Problems, Tuberculosis. Social History Current every day smoker - 40 years, Marital Status - Married, Alcohol Use - Never, Drug Use - No History, Caffeine Use - Daily - coffee, soda. Medical History Eyes Denies history of Cataracts, Glaucoma, Optic Neuritis Ear/Nose/Mouth/Throat Denies history of Chronic sinus problems/congestion, Middle ear problems Hematologic/Lymphatic Denies history of Anemia, Hemophilia, Human Immunodeficiency Virus, Lymphedema, Sickle Cell Disease Respiratory Fana, Paddy ([id number]) Patient has history of Asthma, Chronic Obstructive Pulmonary Disease (COPD), Sleep Apnea Cardiovascular Patient has history of Arrhythmia - sinus tachycardia, Congestive Heart Failure, Coronary Artery Disease, Hypertension Gastrointestinal Denies history of Cirrhosis , Colitis, Crohn s, Hepatitis A, Hepatitis B, Hepatitis C Endocrine Patient has history of Type II Diabetes Genitourinary Denies history of End Stage Renal Disease Immunological Denies history of Lupus Erythematosus, Raynaud s, Scleroderma Integumentary (Skin) Denies history of History  of Burn, History of pressure wounds Musculoskeletal Denies history of Gout, Rheumatoid Arthritis, Osteoarthritis, Osteomyelitis Neurologic Patient has history of Neuropathy - legs, feet and hands Denies history of Dementia, Quadriplegia, Paraplegia, Seizure Disorder Oncologic Patient has history of Received Chemotherapy, Received Radiation Psychiatric Denies history of Anorexia/bulimia, Confinement Anxiety Patient is treated with Insulin, Oral Agents. Blood sugar is tested. Blood sugar results noted at the following times: Breakfast - 402. Medical And Surgical History Notes Neurologic Stroke 2020 Left  weakness Oncologic Breast cancer-right mastectomy Review of Systems (ROS) Integumentary (Skin) Complains or has symptoms of Wounds, Bleeding or bruising tendency. Oncologic Breast Cancer, Ovarian Cancer Objective Constitutional Well-nourished and well-hydrated in no acute distress. Vitals Time Taken: 2:13 PM, Height: 64 in, Weight: 223 lbs, BMI: 38.3, Temperature: 98.0 F, Pulse: 74 bpm, Respiratory Rate: 16 breaths/min, Blood Pressure: 141/68 mmHg. Respiratory normal breathing without difficulty. Psychiatric this patient is able to make decisions and demonstrates good insight into disease process. Alert and Oriented x 3. pleasant and cooperative. General Notes: Upon inspection patient's wounds again on the abdominal area appear to be a hematoma which is resolving not not exactly sure where this came from history is nonrevealing. Secondly the area in question on the crown of her head appears to be very thickened I really do not feel a lot of fluctuance which apparently after discussing with her primary care provider seems to be fairly common of the situation. Nonetheless I did discuss today that I feel like the patient probably needs to be seen by general surgery to see if there is anything that needs to be done here I really think there is much I can offer that has not already been attempted and primary care from a wound care perspective here this is really not a wound but definitely is something that needs to be addressed. Finally I did have a look at her mouth I did not have a otoscope but was able to get a light in just to have a glance of the back of the throat and the good news is that I do not see any really bad ulcerations the bad news is it does appear the patient has oral thrush and that she has been on doxycycline, clindamycin, and 1 other antibiotic that I am unsure of. It is not out of the question that she would potentially have this especially in light of the cancer treatment  currently. Nonetheless I am going to give her something for this today. Other Condition(s) Patient presents with Soft Tissue Infection located on the Head. Patient presents with Other Dermatologic Condition located on the Right Abdomen. 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 She is good to make a referral for the patient to general surgery, Dr. Bary Castilla. I explained that I feel like this could potentially need to be biopsied and it sounds like when its been opened to try to drain previously that it is really not doing much in the way of draining. She is also on significant oral antibiotics which I think has led to the oral thrush which I am good to manage today. Follow-up Appointments: Other: - Patient will follow up with her primary care provider and the surgeon as directed. She only be seen here as needed if they  require additional help on Our end. The following medication(s) was prescribed: nystatin oral 100,000 unit/mL suspension 5 5 ML suspension taken oral. Gargle and swish for 1 minute then swallow 4 times per day for 2 weeks starting 07/15/2021 1. I would recommend currently that we go ahead and send in a prescription for nystatin oral suspension she should gargle and swallow this 4 times per day as directed above. 2. I am also can recommend based on what we are seeing currently that we go ahead and have the primary care provider make a referral to surgery which I think is probably the best option to see what exactly this is that were dealing with here. 3. I am also going to suggest that we go ahead and have the patient is well monitored ongoing for the area of hematoma on her abdomen again I do not see  anything obvious here definitely nothing that appears to be a wound care scenario. Nonetheless I discussed all this with the patient today along with her mother-in-law who was present with her during the evaluation. 4. With regard to the area on her scalp if nothing else is identified probably the best neck step would be a biopsy of the area to ensure that there is no malignancy at this location. Nonetheless again there is no open wound this is really not a wound care scenario is much as it is really more of a dermatologic issue in that regard or even potentially a surgical issue. If that is the reason I recommended that would be the best way to go initially. For the time being I am going to just see the patient back as needed at this point again she has nothing open which will require wound care services currently I tried to do the best I can to help coordinate care going forward and I did send in the medication for the thrush as well for her. With all that being said obviously if anything changes in her services are needed from a wound care perspective we can definitely see about what we can do to intervene in the meantime. Of note despite everything that I did try to do and help with coordination of the patient's care today she was not very pleased with the results of today's visit. In fact that she was walking up the hall past my office talking with her mother-in-law actually heard her say "I guess I am just supposed to go home and die". It already been recommended to her that if she was not doing well that she should go to the ER for further evaluation and treatment. She tells me that "I cannot do that because of my cancer treatment". I am really not sure what else to do for her I have done everything I can to try to help in this case. Electronic Signature(s) Signed: 07/15/2021 5:23:53 PM By: Worthy Keeler PA-C Previous Signature: 07/15/2021 5:22:59 PM Version By: Worthy Keeler PA-C Entered  By: Worthy Keeler on 07/15/2021 17:23:52 [place], [name] ([id number]) -------------------------------------------------------------------------------- ROS/PFSH Details Patient Name: [name] Date of Service: 07/15/2021 2:00 PM Medical Record Number: [id number] Patient Account Number: [id number] Date of Birth/Sex: 01-04-1971 (50 y.o. F) Treating RN: Cornell Barman Primary Care Provider: Alma Friendly Other Clinician: Referring Provider: Karl Ito Treating Provider/Extender: Skipper Cliche in Treatment: 0 Information Obtained From Patient Integumentary (Skin) Complaints and Symptoms: Positive for: Wounds; Bleeding or bruising tendency Medical History: Negative for: History of Burn; History of pressure wounds Eyes Medical History:  Negative for: Cataracts; Glaucoma; Optic Neuritis Ear/Nose/Mouth/Throat Medical History: Negative for: Chronic sinus problems/congestion; Middle ear problems Hematologic/Lymphatic Medical History: Negative for: Anemia; Hemophilia; Human Immunodeficiency Virus; Lymphedema; Sickle Cell Disease Respiratory Medical History: Positive for: Asthma; Chronic Obstructive Pulmonary Disease (COPD); Sleep Apnea Cardiovascular Medical History: Positive for: Arrhythmia - sinus tachycardia; Congestive Heart Failure; Coronary Artery Disease; Hypertension Gastrointestinal Medical History: Negative for: Cirrhosis ; Colitis; Crohnos; Hepatitis A; Hepatitis B; Hepatitis C Endocrine Medical History: Positive for: Type II Diabetes Time with diabetes: 30 years Treated with: Insulin, Oral agents Blood sugar tested every day: Yes Tested : daily Blood sugar testing results: Breakfast: 402 Genitourinary Medical History: Negative for: End Stage Renal Disease Immunological ALIANI, CACCAVALE ([id number]) Medical History: Negative for: Lupus Erythematosus; Raynaudos; Scleroderma Musculoskeletal Medical History: Negative for: Gout; Rheumatoid Arthritis;  Osteoarthritis; Osteomyelitis Neurologic Medical History: Positive for: Neuropathy - legs, feet and hands Negative for: Dementia; Quadriplegia; Paraplegia; Seizure Disorder Past Medical History Notes: Stroke 2020 Left weakness Oncologic Complaints and Symptoms: Review of System Notes: Breast Cancer, Ovarian Cancer Medical History: Positive for: Received Chemotherapy; Received Radiation Past Medical History Notes: Breast cancer-right mastectomy Psychiatric Medical History: Negative for: Anorexia/bulimia; Confinement Anxiety Immunizations Pneumococcal Vaccine: Received Pneumococcal Vaccination: Yes Received Pneumococcal Vaccination On or After 60th Birthday: No Implantable Devices None Family and Social History Cancer: Yes - Siblings; Diabetes: Yes - Siblings,Father,Mother; Heart Disease: Yes - Siblings,Father,Mother; Hereditary Spherocytosis: No; Hypertension: Yes - Mother,Father,Siblings; Kidney Disease: No; Lung Disease: Yes - Mother; Seizures: Yes - Mother; Stroke: Yes - Mother,Siblings,Father; Thyroid Problems: No; Tuberculosis: No; Current every day smoker - 40 years; Marital Status - Married; Alcohol Use: Never; Drug Use: No History; Caffeine Use: Daily - coffee, soda; Financial Concerns: No; Food, Clothing or Shelter Needs: No; Support System Lacking: No; Transportation Concerns: No Engineer, maintenance) Signed: 07/16/2021 5:16:08 PM By: Gretta Cool, BSN, RN, CWS, Kim RN, BSN Signed: 07/16/2021 5:21:10 PM By: Worthy Keeler PA-C Entered By: Gretta Cool, BSN, RN, CWS, Kim on 07/15/2021 14:18:19 [place], [name] ([id number]) -------------------------------------------------------------------------------- [place] Details Patient Name: [name] Date of Service: 07/15/2021 Medical Record Number: [id number] Patient Account Number: [id number] Date of Birth/Sex: 1971-04-24 (50 y.o. 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

## 2021-07-19 ENCOUNTER — Telehealth: Payer: Self-pay | Admitting: Primary Care

## 2021-07-19 IMAGING — US US BREAST*L* LIMITED INC AXILLA
1 series · 2 of 2 positions shown · non-contrast
Comparison: Previous exam(s).

CLINICAL DATA: Palpable abnormality in the LEFT breast. History of
RIGHT mastectomy in 1013.

EXAM:
DIGITAL DIAGNOSTIC UNILATERAL LEFT MAMMOGRAM WITH TOMOSYNTHESIS AND
CAD; ULTRASOUND LEFT BREAST LIMITED
TECHNIQUE: Left digital diagnostic mammography and breast tomosynthesis was
performed. The images were evaluated with computer-aided detection.;
Targeted ultrasound examination of the left breast was performed

[Series 1: us breast*left* limited inc axilla · 0.07mm/px · 2 of 2 slices shown]
[im 1/2]
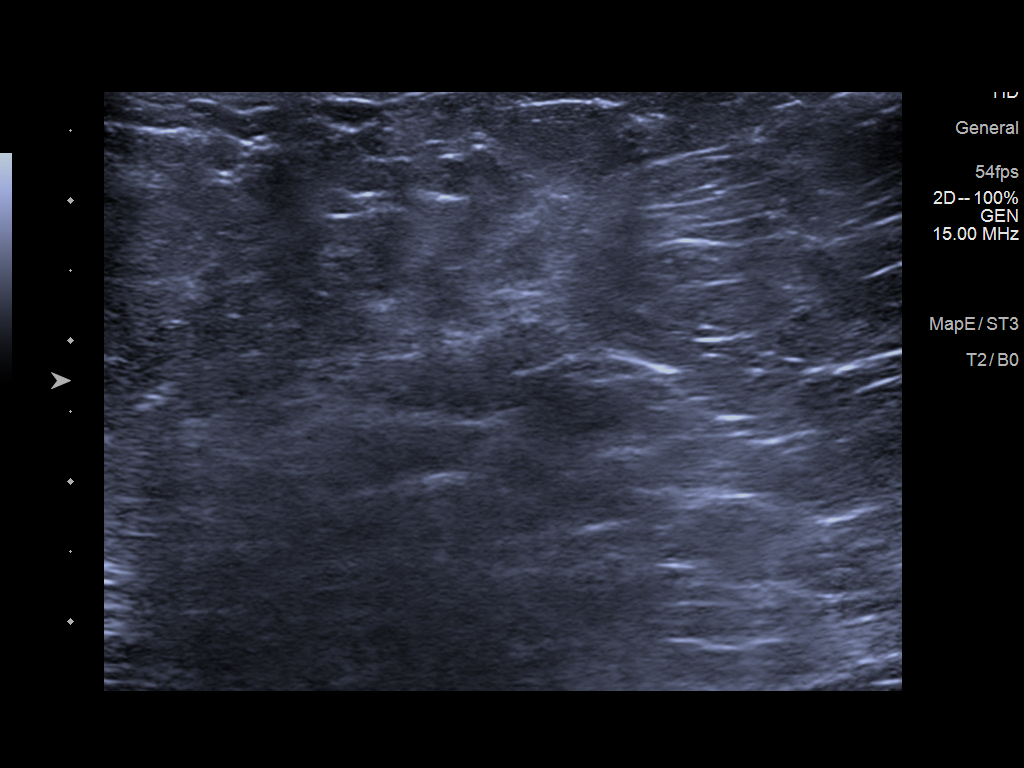
[im 2/2]
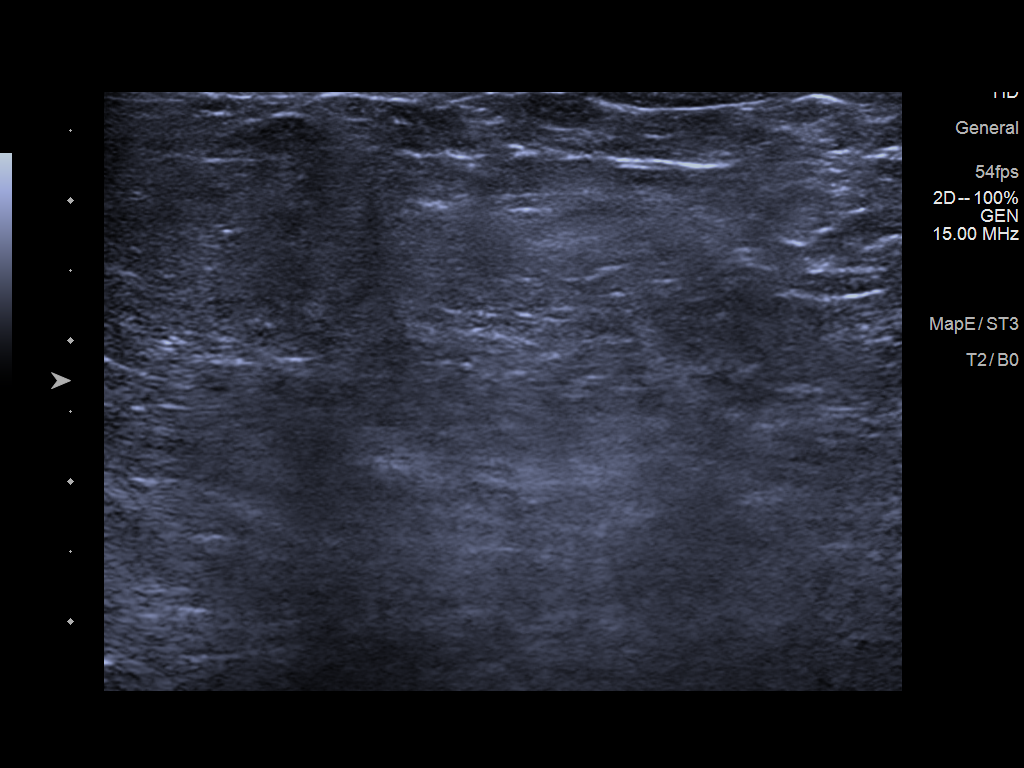

[2 of 2 positions shown; findings below may reference images not displayed]

ACR Breast Density Category b: There are scattered areas of
fibroglandular density.
FINDINGS: No suspicious mass, distortion, or microcalcifications are
identified to suggest presence of malignancy. Shows normal appearing
fibroglandular tissue in the area of concern in the anterior LEFT
breast.

On physical exam, there is no visible or palpable abnormality in the
LEFT areolar region.

Targeted ultrasound is performed, showing normal appearing
fibrofatty tissue deep to the LEFT areola. There is no sonographic
evidence for malignancy.
IMPRESSION: No mammographic or ultrasound evidence for malignancy.

RECOMMENDATION:
Recommend LEFT screening mammogram in 1 year.

I have discussed the findings and recommendations with the patient.
If applicable, a reminder letter will be sent to the patient
regarding the next appointment.

BI-RADS CATEGORY  1: Negative.

## 2021-07-19 IMAGING — MG MM DIGITAL DIAGNOSTIC UNILAT*L* W/ TOMO W/ CAD
6 series · 6 of 18 positions shown · non-contrast
Comparison: Previous exam(s).

CLINICAL DATA: Palpable abnormality in the LEFT breast. History of
RIGHT mastectomy in 1013.

EXAM:
DIGITAL DIAGNOSTIC UNILATERAL LEFT MAMMOGRAM WITH TOMOSYNTHESIS AND
CAD; ULTRASOUND LEFT BREAST LIMITED
TECHNIQUE: Left digital diagnostic mammography and breast tomosynthesis was
performed. The images were evaluated with computer-aided detection.;
Targeted ultrasound examination of the left breast was performed

[L CC synth-2D (1 of 2)]
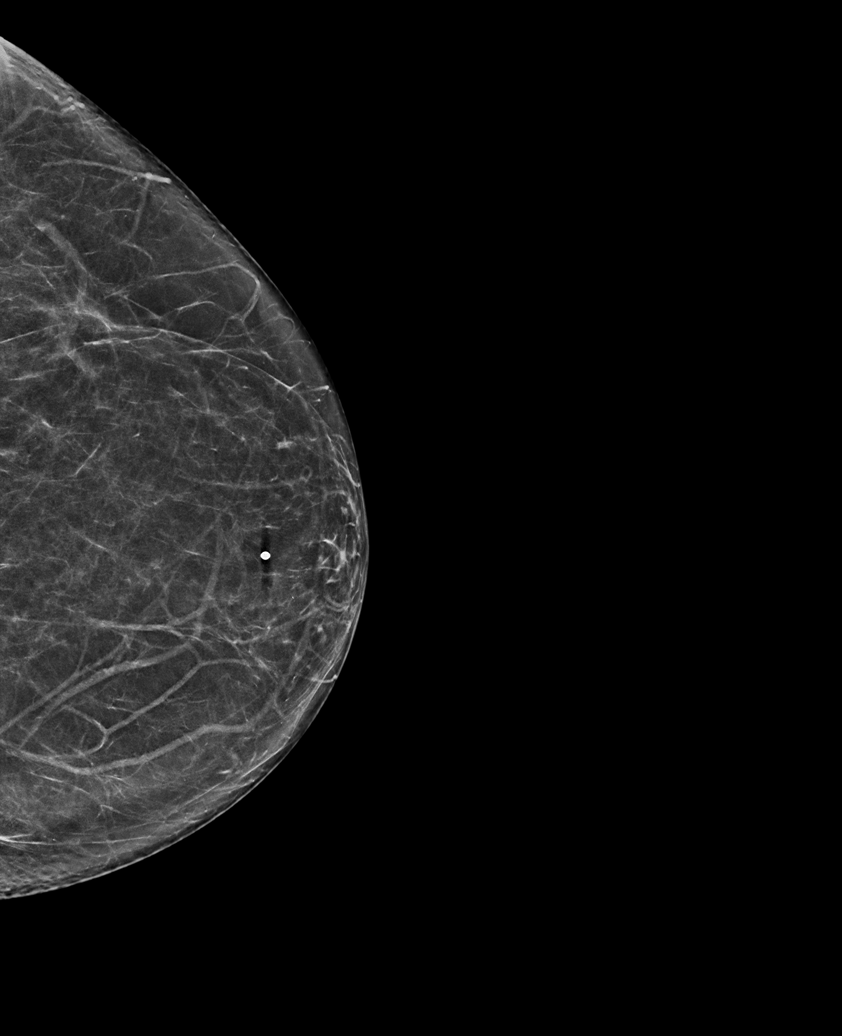

[L CC synth-2D (2 of 2)]
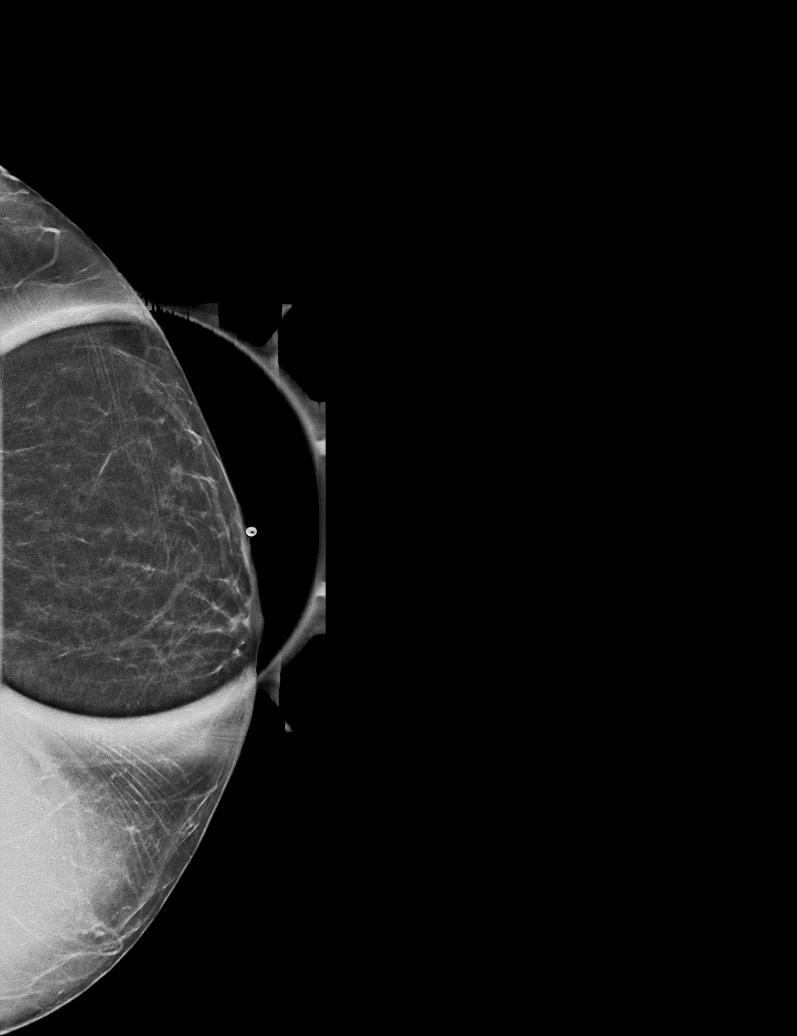

[L MLO synth-2D]
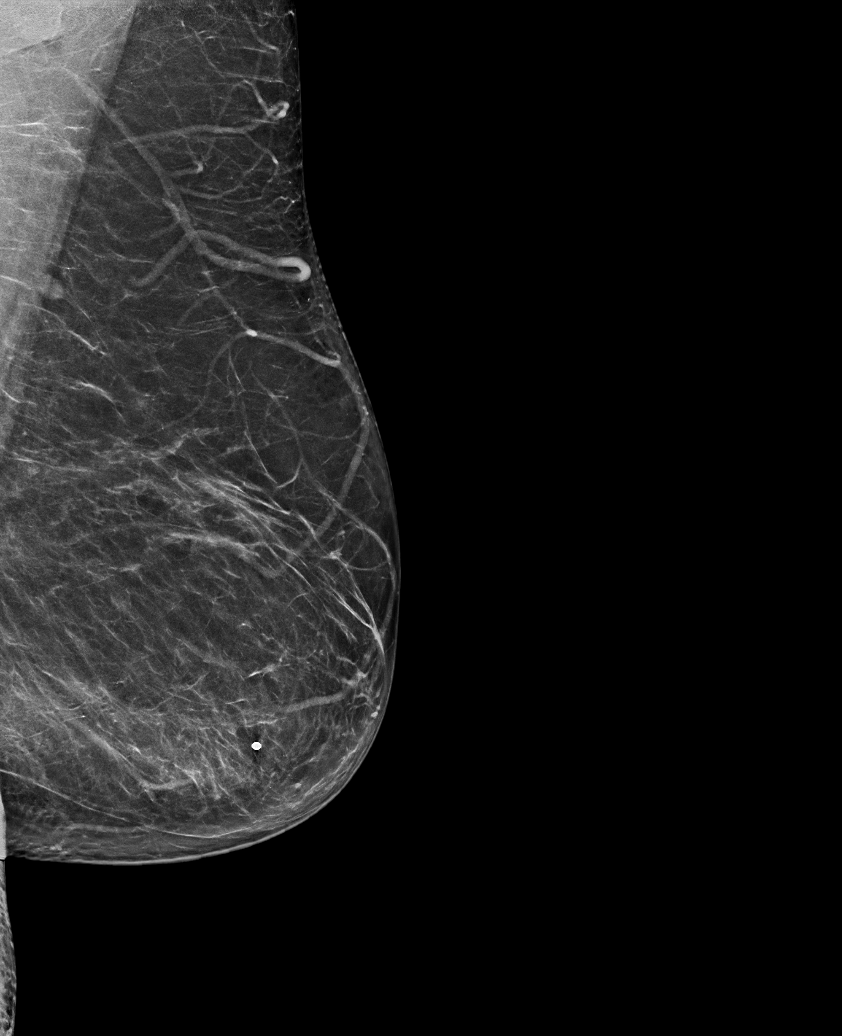

[L CC tomo (1 of 2) · tomo slice 33/65.0]
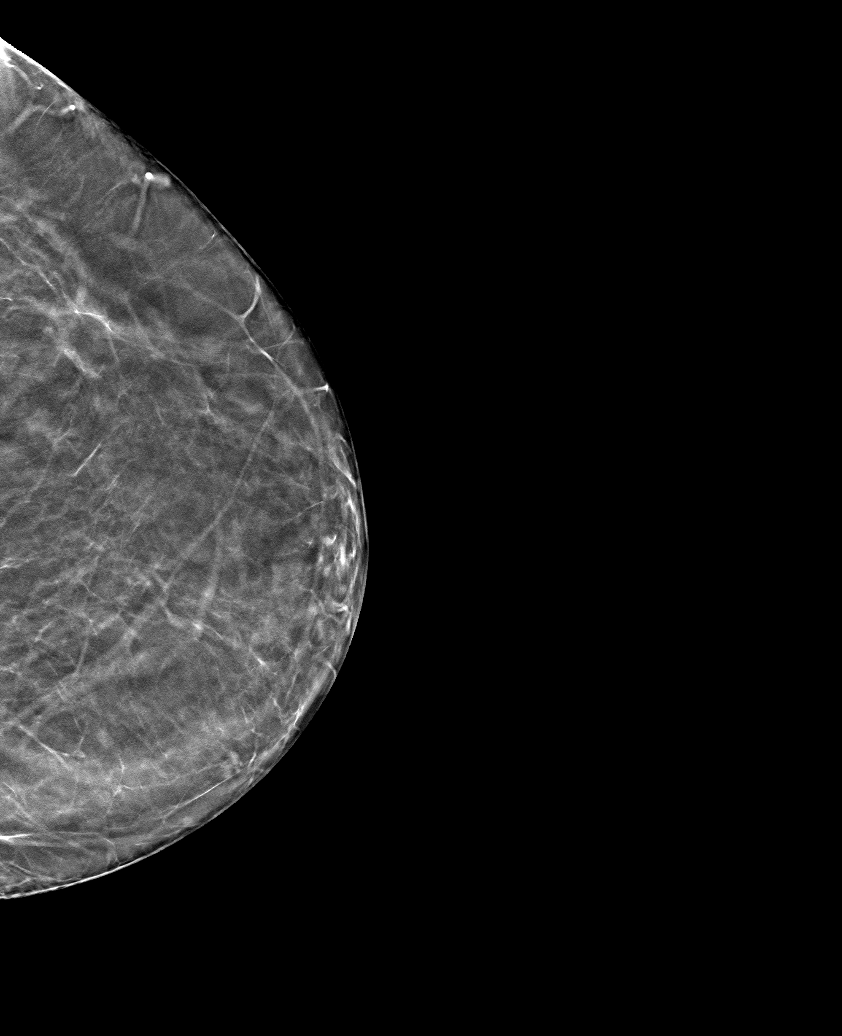

[L MLO tomo · tomo slice 38/75.0]
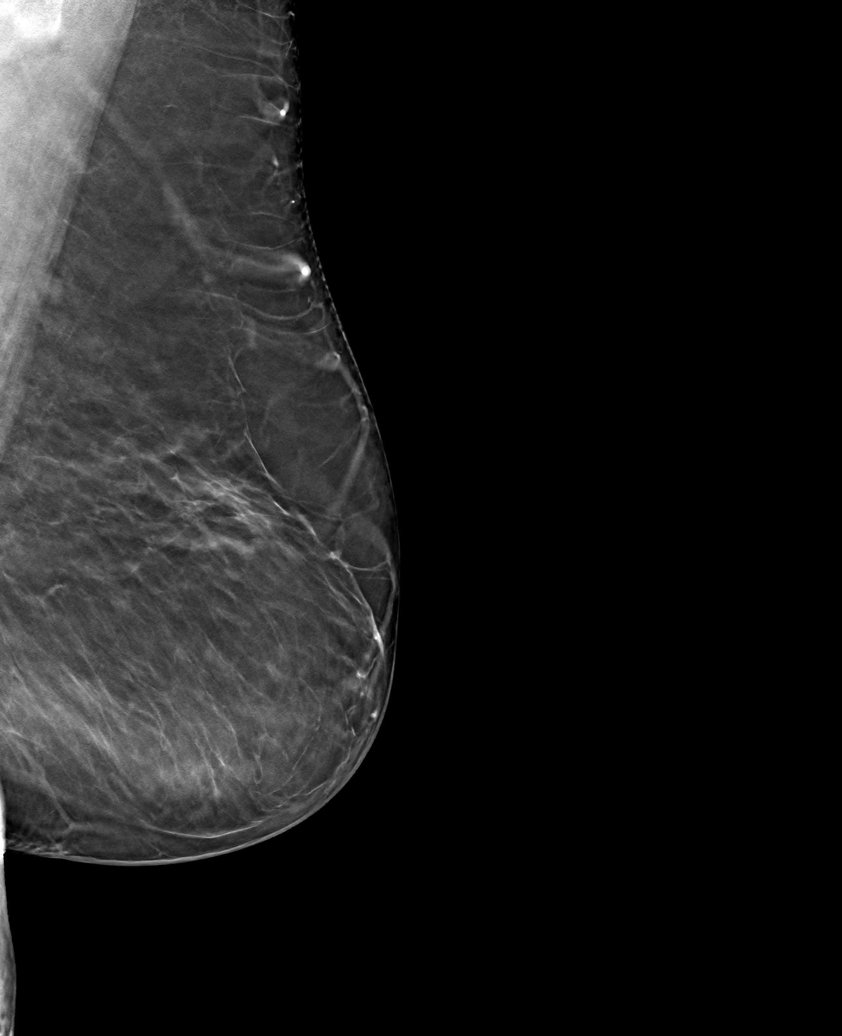

[L CC tomo (2 of 2) · tomo slice 22/43.0]
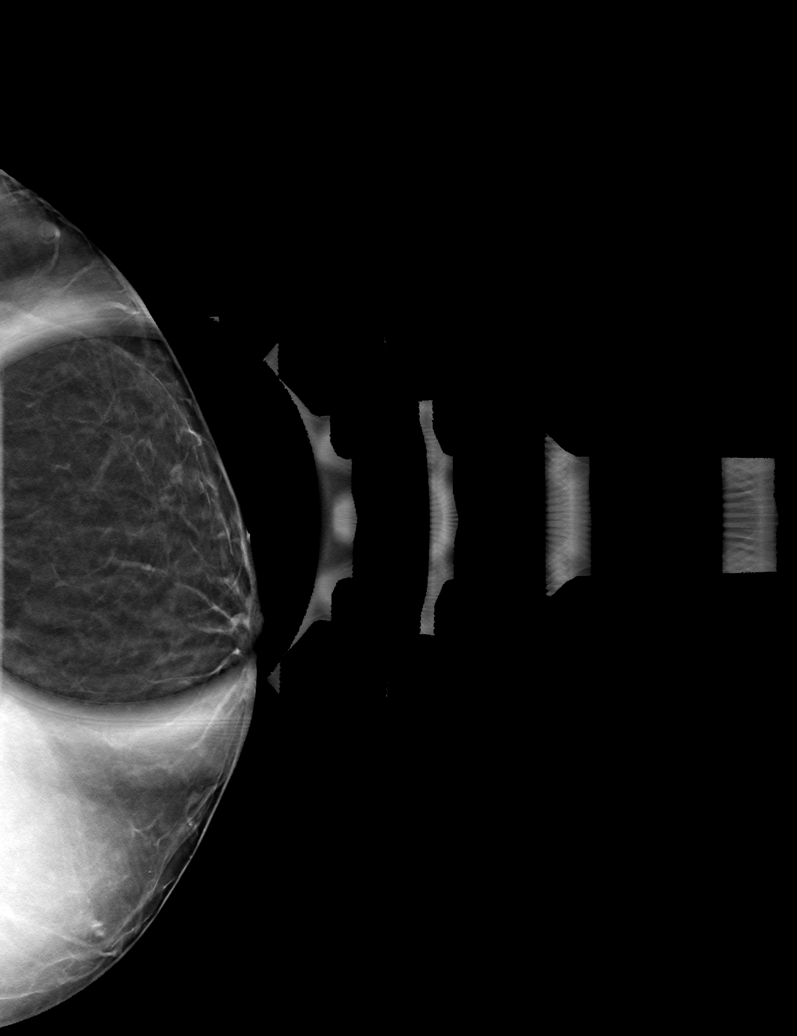

[6 of 18 positions shown; findings below may reference images not displayed]

ACR Breast Density Category b: There are scattered areas of
fibroglandular density.
FINDINGS: No suspicious mass, distortion, or microcalcifications are
identified to suggest presence of malignancy. Shows normal appearing
fibroglandular tissue in the area of concern in the anterior LEFT
breast.

On physical exam, there is no visible or palpable abnormality in the
LEFT areolar region.

Targeted ultrasound is performed, showing normal appearing
fibrofatty tissue deep to the LEFT areola. There is no sonographic
evidence for malignancy.
IMPRESSION: No mammographic or ultrasound evidence for malignancy.

RECOMMENDATION:
Recommend LEFT screening mammogram in 1 year.

I have discussed the findings and recommendations with the patient.
If applicable, a reminder letter will be sent to the patient
regarding the next appointment.

BI-RADS CATEGORY  1: Negative.

## 2021-07-19 NOTE — Telephone Encounter (Signed)
FYI  I have called patient let know referral was sent and provided patient and husband with number to office so that they are able to call to see aobut setting up appointment.

## 2021-07-19 NOTE — Telephone Encounter (Signed)
[name] called in and stated that she went to hospital on Friday and saturday and they were full on both days and wanted to know about getting set up with the surgeon again.

## 2021-07-20 ENCOUNTER — Other Ambulatory Visit: Payer: Self-pay | Admitting: General Surgery

## 2021-07-20 DIAGNOSIS — L02811 Cutaneous abscess of head [any part, except face]: Secondary | ICD-10-CM | POA: Diagnosis not present

## 2021-07-20 DIAGNOSIS — L02219 Cutaneous abscess of trunk, unspecified: Secondary | ICD-10-CM

## 2021-07-20 DIAGNOSIS — L02211 Cutaneous abscess of abdominal wall: Secondary | ICD-10-CM | POA: Diagnosis not present
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
## 2021-07-21 ENCOUNTER — Ambulatory Visit: Payer: BC Managed Care – PPO | Admitting: Registered Nurse

## 2021-07-21 ENCOUNTER — Ambulatory Visit
Admission: RE | Admit: 2021-07-21 | Discharge: 2021-07-21 | Disposition: A | Discharge status: Home / Self Care | Payer: BC Managed Care – PPO | Attending: General Surgery | Admitting: General Surgery

## 2021-07-21 ENCOUNTER — Encounter: Payer: Self-pay | Admitting: General Surgery

## 2021-07-21 ENCOUNTER — Encounter
Admission: RE | Disposition: A | Discharge status: Home / Self Care | Payer: Self-pay | Source: Home / Self Care | Attending: General Surgery

## 2021-07-21 DIAGNOSIS — Z794 Long term (current) use of insulin: Secondary | ICD-10-CM | POA: Diagnosis not present

## 2021-07-21 DIAGNOSIS — F17210 Nicotine dependence, cigarettes, uncomplicated: Secondary | ICD-10-CM | POA: Diagnosis not present

## 2021-07-21 DIAGNOSIS — L02211 Cutaneous abscess of abdominal wall: Secondary | ICD-10-CM | POA: Insufficient documentation

## 2021-07-21 DIAGNOSIS — E1142 Type 2 diabetes mellitus with diabetic polyneuropathy: Secondary | ICD-10-CM | POA: Diagnosis not present

## 2021-07-21 DIAGNOSIS — E785 Hyperlipidemia, unspecified: Secondary | ICD-10-CM | POA: Diagnosis not present

## 2021-07-21 DIAGNOSIS — L02811 Cutaneous abscess of head [any part, except face]: Secondary | ICD-10-CM | POA: Insufficient documentation

## 2021-07-21 DIAGNOSIS — L089 Local infection of the skin and subcutaneous tissue, unspecified: Secondary | ICD-10-CM | POA: Diagnosis not present

## 2021-07-21 DIAGNOSIS — E8589 Other amyloidosis: Secondary | ICD-10-CM | POA: Diagnosis not present

## 2021-07-21 DIAGNOSIS — L02219 Cutaneous abscess of trunk, unspecified: Secondary | ICD-10-CM

## 2021-07-21 HISTORY — PX: INCISION AND DRAINAGE ABSCESS: SHX5864

## 2021-07-21 HISTORY — PX: IRRIGATION AND DEBRIDEMENT ABSCESS: SHX5252

## 2021-07-21 LAB — CBC WITH DIFFERENTIAL/PLATELET
Abs Immature Granulocytes: 0.04 10*3/uL (ref 0.00–0.07)
Basophils Absolute: 0.1 10*3/uL (ref 0.0–0.1)
Basophils Relative: 1 %
Eosinophils Absolute: 0.2 10*3/uL (ref 0.0–0.5)
Eosinophils Relative: 3 %
HCT: 37.5 % (ref 36.0–46.0)
Hemoglobin: 12.4 g/dL (ref 12.0–15.0)
Immature Granulocytes: 1 %
Lymphocytes Relative: 16 %
Lymphs Abs: 1.2 10*3/uL (ref 0.7–4.0)
MCH: 29.4 pg (ref 26.0–34.0)
MCHC: 33.1 g/dL (ref 30.0–36.0)
MCV: 88.9 fL (ref 80.0–100.0)
Monocytes Absolute: 0.5 10*3/uL (ref 0.1–1.0)
Monocytes Relative: 6 %
Neutro Abs: 5.4 10*3/uL (ref 1.7–7.7)
Neutrophils Relative %: 73 %
Platelets: 193 10*3/uL (ref 150–400)
RBC: 4.22 MIL/uL (ref 3.87–5.11)
RDW: 15.8 % — ABNORMAL HIGH (ref 11.5–15.5)
WBC: 7.3 10*3/uL (ref 4.0–10.5)
nRBC: 0 % (ref 0.0–0.2)

## 2021-07-21 LAB — BASIC METABOLIC PANEL
Anion gap: 5 (ref 5–15)
BUN: 21 mg/dL — ABNORMAL HIGH (ref 6–20)
CO2: 26 mmol/L (ref 22–32)
Calcium: 8.9 mg/dL (ref 8.9–10.3)
Chloride: 104 mmol/L (ref 98–111)
Creatinine, Ser: 0.95 mg/dL (ref 0.44–1.00)
GFR, Estimated: >60 mL/min (ref >60)
Glucose, Bld: 255 mg/dL — ABNORMAL HIGH (ref 70–99)
Potassium: 4.2 mmol/L (ref 3.5–5.1)
Sodium: 135 mmol/L (ref 135–145)

## 2021-07-21 LAB — GLUCOSE, CAPILLARY
Glucose-Capillary: 262 mg/dL — ABNORMAL HIGH (ref 70–99)
Glucose-Capillary: 256 mg/dL — ABNORMAL HIGH (ref 70–99)

## 2021-07-21 SURGERY — INCISION AND DRAINAGE, ABSCESS
Anesthesia: General | Site: Abdomen

## 2021-07-21 MED ORDER — LIDOCAINE HCL (PF) 2 % IJ SOLN
INTRAMUSCULAR | Status: AC
  Filled 2021-07-21: qty 5

## 2021-07-21 MED ORDER — BUPIVACAINE-EPINEPHRINE (PF) 0.5% -1:200000 IJ SOLN
INTRAMUSCULAR | Status: AC
  Filled 2021-07-21: qty 30

## 2021-07-21 MED ORDER — CHLORHEXIDINE GLUCONATE 0.12 % MT SOLN
OROMUCOSAL | Status: AC
  Filled 2021-07-21: qty 15

## 2021-07-21 MED ORDER — PROPOFOL 10 MG/ML IV BOLUS
INTRAVENOUS | Status: DC | PRN
Start: 2021-07-21 — End: 2021-07-21
  Administered 2021-07-21: 180 mg via INTRAVENOUS

## 2021-07-21 MED ORDER — DEXMEDETOMIDINE HCL IN NACL 200 MCG/50ML IV SOLN
INTRAVENOUS | Status: AC
  Filled 2021-07-21: qty 50

## 2021-07-21 MED ORDER — INSULIN ASPART 100 UNIT/ML IJ SOLN
INTRAMUSCULAR | Status: AC
  Filled 2021-07-21: qty 1

## 2021-07-21 MED ORDER — DEXMEDETOMIDINE (PRECEDEX) IN NS 20 MCG/5ML (4 MCG/ML) IV SYRINGE
PREFILLED_SYRINGE | INTRAVENOUS | Status: DC | PRN
  Administered 2021-07-21: 8 ug via INTRAVENOUS

## 2021-07-21 MED ORDER — ACETAMINOPHEN 10 MG/ML IV SOLN
INTRAVENOUS | Status: AC
  Filled 2021-07-21: qty 100

## 2021-07-21 MED ORDER — MIDAZOLAM HCL 2 MG/2ML IJ SOLN
INTRAMUSCULAR | Status: AC
  Filled 2021-07-21: qty 2

## 2021-07-21 MED ORDER — FENTANYL CITRATE (PF) 100 MCG/2ML IJ SOLN
INTRAMUSCULAR | Status: DC | PRN
  Administered 2021-07-21: 50 ug via INTRAVENOUS

## 2021-07-21 MED ORDER — 0.9 % SODIUM CHLORIDE (POUR BTL) OPTIME
TOPICAL | Status: DC | PRN
  Administered 2021-07-21: 1000 mL

## 2021-07-21 MED ORDER — CHLORHEXIDINE GLUCONATE CLOTH 2 % EX PADS: 6.0000 | MEDICATED_PAD | Freq: Once | CUTANEOUS | Status: DC

## 2021-07-21 MED ORDER — ONDANSETRON HCL 4 MG/2ML IJ SOLN: 4.0000 mg | Freq: Once | INTRAMUSCULAR | Status: DC | PRN

## 2021-07-21 MED ORDER — ACETAMINOPHEN 10 MG/ML IV SOLN
INTRAVENOUS | Status: DC | PRN
  Administered 2021-07-21: 1000 mg via INTRAVENOUS

## 2021-07-21 MED ORDER — GLYCOPYRROLATE 0.2 MG/ML IJ SOLN
INTRAMUSCULAR | Status: AC
  Filled 2021-07-21: qty 1

## 2021-07-21 MED ORDER — INSULIN ASPART 100 UNIT/ML IJ SOLN: 3.0000 [IU] | INTRAMUSCULAR | Status: AC

## 2021-07-21 MED ORDER — TRAMADOL HCL 50 MG PO TABS: 50.0000 mg | ORAL_TABLET | Freq: Four times a day (QID) | ORAL | 0 refills | Status: DC | PRN

## 2021-07-21 MED ORDER — FENTANYL CITRATE (PF) 100 MCG/2ML IJ SOLN
25.0000 ug | INTRAMUSCULAR | Status: DC | PRN
  Administered 2021-07-21: 25 ug via INTRAVENOUS

## 2021-07-21 MED ORDER — EPHEDRINE SULFATE 50 MG/ML IJ SOLN
INTRAMUSCULAR | Status: DC | PRN
  Administered 2021-07-21 (×2): 10 mg via INTRAVENOUS
  Administered 2021-07-21: 5 mg via INTRAVENOUS
  Administered 2021-07-21: 10 mg via INTRAVENOUS
  Administered 2021-07-21: 5 mg via INTRAVENOUS
  Administered 2021-07-21: 10 mg via INTRAVENOUS

## 2021-07-21 MED ORDER — DEXAMETHASONE SODIUM PHOSPHATE 10 MG/ML IJ SOLN
INTRAMUSCULAR | Status: AC
  Filled 2021-07-21: qty 1

## 2021-07-21 MED ORDER — INSULIN ASPART 100 UNIT/ML IJ SOLN
3.0000 [IU] | Freq: Once | INTRAMUSCULAR | Status: AC
  Administered 2021-07-21: 3 [IU] via SUBCUTANEOUS

## 2021-07-21 MED ORDER — MIDAZOLAM HCL 2 MG/2ML IJ SOLN
INTRAMUSCULAR | Status: DC | PRN
  Administered 2021-07-21: 2 mg via INTRAVENOUS

## 2021-07-21 MED ORDER — SODIUM CHLORIDE 0.9 % IV SOLN: INTRAVENOUS | Status: DC

## 2021-07-21 MED ORDER — BUPIVACAINE HCL (PF) 0.5 % IJ SOLN
INTRAMUSCULAR | Status: AC
  Filled 2021-07-21: qty 30

## 2021-07-21 MED ORDER — ONDANSETRON HCL 4 MG/2ML IJ SOLN
INTRAMUSCULAR | Status: AC
  Filled 2021-07-21: qty 2

## 2021-07-21 MED ORDER — CLINDAMYCIN PHOSPHATE 900 MG/50ML IV SOLN: 900.0000 mg | INTRAVENOUS | Status: DC

## 2021-07-21 MED ORDER — CHLORHEXIDINE GLUCONATE 0.12 % MT SOLN
15.0000 mL | Freq: Once | OROMUCOSAL | Status: AC
  Administered 2021-07-21: 15 mL via OROMUCOSAL

## 2021-07-21 MED ORDER — ONDANSETRON HCL 4 MG/2ML IJ SOLN
INTRAMUSCULAR | Status: DC | PRN
Start: 2021-07-21 — End: 2021-07-21
  Administered 2021-07-21: 4 mg via INTRAVENOUS

## 2021-07-21 MED ORDER — CLINDAMYCIN HCL 300 MG PO CAPS: 300.0000 mg | ORAL_CAPSULE | Freq: Three times a day (TID) | ORAL | 0 refills | Status: AC

## 2021-07-21 MED ORDER — ORAL CARE MOUTH RINSE: 15.0000 mL | Freq: Once | OROMUCOSAL | Status: AC

## 2021-07-21 MED ORDER — INSULIN ASPART 100 UNIT/ML IJ SOLN
INTRAMUSCULAR | Status: AC
  Administered 2021-07-21: 3 [IU] via SUBCUTANEOUS
  Filled 2021-07-21: qty 1

## 2021-07-21 MED ORDER — PROPOFOL 10 MG/ML IV BOLUS
INTRAVENOUS | Status: AC
  Filled 2021-07-21: qty 20

## 2021-07-21 MED ORDER — FENTANYL CITRATE (PF) 100 MCG/2ML IJ SOLN
INTRAMUSCULAR | Status: AC
  Filled 2021-07-21: qty 2

## 2021-07-21 MED ORDER — LIDOCAINE HCL (CARDIAC) PF 100 MG/5ML IV SOSY
PREFILLED_SYRINGE | INTRAVENOUS | Status: DC | PRN
  Administered 2021-07-21: 100 mg via INTRAVENOUS

## 2021-07-21 MED ORDER — CLINDAMYCIN PHOSPHATE 900 MG/50ML IV SOLN
INTRAVENOUS | Status: AC
  Filled 2021-07-21: qty 50

## 2021-07-21 MED ORDER — PHENYLEPHRINE HCL (PRESSORS) 10 MG/ML IV SOLN
INTRAVENOUS | Status: DC | PRN
  Administered 2021-07-21 (×2): 80 ug via INTRAVENOUS
  Administered 2021-07-21 (×2): 160 ug via INTRAVENOUS

## 2021-07-21 MED ORDER — BUPIVACAINE-EPINEPHRINE (PF) 0.5% -1:200000 IJ SOLN
INTRAMUSCULAR | Status: DC | PRN
  Administered 2021-07-21: 20 mL

## 2021-07-21 MED ORDER — SODIUM CHLORIDE 0.9 % IV SOLN: INTRAVENOUS | Status: DC | PRN

## 2021-07-21 SURGICAL SUPPLY — 50 items
APL PRP STRL LF DISP 70% ISPRP (MISCELLANEOUS) ×2
BLADE SURG 10 STRL SS SAFETY (BLADE) ×3 IMPLANT
BLADE SURG 15 STRL SS SAFETY (BLADE) ×4 IMPLANT
CANISTER WOUND CARE 500ML ATS (WOUND CARE) ×3 IMPLANT
CHLORAPREP W/TINT 26 (MISCELLANEOUS) ×3 IMPLANT
DRAPE LAPAROTOMY 100X77 ABD (DRAPES) ×3 IMPLANT
DRSG TEGADERM 4X4.75 (GAUZE/BANDAGES/DRESSINGS) ×3 IMPLANT
DRSG TELFA 3X8 NADH (GAUZE/BANDAGES/DRESSINGS) ×3 IMPLANT
DRSG TELFA 4X3 1S NADH ST (GAUZE/BANDAGES/DRESSINGS) ×1 IMPLANT
ELECT REM PT RETURN 9FT ADLT (ELECTROSURGICAL) ×3
ELECTRODE REM PT RTRN 9FT ADLT (ELECTROSURGICAL) ×2 IMPLANT
GAUZE 4X4 16PLY ~~LOC~~+RFID DBL (SPONGE) ×3 IMPLANT
GAUZE SPONGE 4X4 12PLY STRL (GAUZE/BANDAGES/DRESSINGS) ×3 IMPLANT
GLOVE SURG ENC MOIS LTX SZ7.5 (GLOVE) ×3 IMPLANT
GLOVE SURG ENC TEXT LTX SZ7.5 (GLOVE) ×3 IMPLANT
GLOVE SURG UNDER LTX SZ7.5 (GLOVE) ×3 IMPLANT
GLOVE SURG UNDER LTX SZ8 (GLOVE) ×3 IMPLANT
GOWN STRL REUS W/ TWL LRG LVL3 (GOWN DISPOSABLE) ×4 IMPLANT
GOWN STRL REUS W/TWL LRG LVL3 (GOWN DISPOSABLE) ×6
KIT TURNOVER KIT A (KITS) ×3 IMPLANT
LABEL OR SOLS (LABEL) ×3 IMPLANT
MANIFOLD NEPTUNE II (INSTRUMENTS) ×3 IMPLANT
NDL HYPO 25X1 1.5 SAFETY (NEEDLE) IMPLANT
NEEDLE HYPO 25X1 1.5 SAFETY (NEEDLE) IMPLANT
NS IRRIG 1000ML POUR BTL (IV SOLUTION) ×3 IMPLANT
NS IRRIG 500ML POUR BTL (IV SOLUTION) ×3 IMPLANT
PACK BASIN MAJOR ARMC (MISCELLANEOUS) ×3 IMPLANT
PACK BASIN MINOR ARMC (MISCELLANEOUS) ×3 IMPLANT
PAD ABD DERMACEA PRESS 5X9 (GAUZE/BANDAGES/DRESSINGS) ×3 IMPLANT
PAD DRESSING TELFA 3X8 NADH (GAUZE/BANDAGES/DRESSINGS) ×2 IMPLANT
SPONGE T-LAP 18X18 ~~LOC~~+RFID (SPONGE) ×6 IMPLANT
STAPLER SKIN PROX 35W (STAPLE) ×3 IMPLANT
STRIP CLOSURE SKIN 1/2X4 (GAUZE/BANDAGES/DRESSINGS) ×3 IMPLANT
SUT CHROMIC 0 CT 1 (SUTURE) ×3 IMPLANT
SUT CHROMIC BR 1/2CLE 2-0 54IN (SUTURE) ×3 IMPLANT
SUT ETHILON 3-0 FS-10 30 BLK (SUTURE) ×3
SUT MAXON ABS #0 GS21 30IN (SUTURE) ×12 IMPLANT
SUT VIC AB 2-0 CT1 36 (SUTURE) ×2 IMPLANT
SUT VIC AB 3-0 54X BRD REEL (SUTURE) IMPLANT
SUT VIC AB 3-0 BRD 54 (SUTURE) ×3
SUT VIC AB 3-0 SH 27 (SUTURE) ×3
SUT VIC AB 3-0 SH 27X BRD (SUTURE) ×2 IMPLANT
SUT VIC AB 4-0 FS2 27 (SUTURE) ×3 IMPLANT
SUTURE EHLN 3-0 FS-10 30 BLK (SUTURE) ×2 IMPLANT
SWAB CULTURE AMIES ANAERIB BLU (MISCELLANEOUS) ×3 IMPLANT
SWABSTK COMLB BENZOIN TINCTURE (MISCELLANEOUS) ×3 IMPLANT
SYR BULB IRRIG 60ML STRL (SYRINGE) ×3 IMPLANT
SYR CONTROL 10ML LL (SYRINGE) IMPLANT
TRAY FOLEY MTR SLVR 16FR STAT (SET/KITS/TRAYS/PACK) IMPLANT
WATER STERILE IRR 500ML POUR (IV SOLUTION) ×3 IMPLANT
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
## 2021-07-21 NOTE — Discharge Instructions (Signed)
AMBULATORY SURGERY  DISCHARGE INSTRUCTIONS   The drugs that you were given will stay in your system until tomorrow so for the next 24 hours you should not:  Drive an automobile Make any legal decisions Drink any alcoholic beverage   You may resume regular meals tomorrow.  Today it is better to start with liquids and gradually work up to solid foods.  You may eat anything you prefer, but it is better to start with liquids, then soup and crackers, and gradually work up to solid foods.   Please notify your doctor immediately if you have any unusual bleeding, trouble breathing, redness and pain at the surgery site, drainage, fever, or pain not relieved by medication.     Your post-operative visit with Dr.                                       is: Date:                        Time:    Please call to schedule your post-operative visit.  Additional Instructions: 

## 2021-07-21 NOTE — Transfer of Care (Signed)
Immediate Anesthesia Transfer of Care Note  Patient: [name]  Procedure(s) Performed: INCISION AND DRAINAGE ABSCESS Scalp IRRIGATION AND DEBRIDEMENT ABSCESS abdomen (Abdomen)  Patient Location: PACU  Anesthesia Type:General  Level of Consciousness: drowsy  Airway & Oxygen Therapy: Patient Spontanous Breathing; face mask in place  Post-op Assessment: Report given to RN and Post -op Vital signs reviewed and stable  Post vital signs: Reviewed and stable  Last Vitals:  Vitals Value Taken Time  BP 123/71 07/21/21 1400  Temp    Pulse 75 07/21/21 1402  Resp 20 07/21/21 1402  SpO2 100 % 07/21/21 1402  Vitals shown include unvalidated device data.  Last Pain:  Vitals:   07/21/21 1215  TempSrc: Temporal         Complications: No notable events documented.

## 2021-07-21 NOTE — Anesthesia Preprocedure Evaluation (Addendum)
Anesthesia Evaluation  Patient identified by MRN, date of birth, ID band Patient awake    Reviewed: Allergy & Precautions, H&P , NPO status , Patient's Chart, lab work & pertinent test results, reviewed documented beta blocker date and time   Airway Mallampati: II  TM Distance: >3 FB Neck ROM: full    Dental  (+) Teeth Intact   Pulmonary shortness of breath and with exertion, asthma , sleep apnea and Continuous Positive Airway Pressure Ventilation , COPD,  COPD inhaler, Current Smoker and Patient abstained from smoking.,    Pulmonary exam normal        Cardiovascular Exercise Tolerance: Poor hypertension, On Medications + CAD, + Peripheral Vascular Disease, +CHF and + Orthopnea  Normal cardiovascular exam+ dysrhythmias  Rate:Normal     Neuro/Psych  Headaches, Seizures -,  PSYCHIATRIC DISORDERS Anxiety Bipolar Disorder TIA Neuromuscular disease CVA, Residual Symptoms    GI/Hepatic Neg liver ROS, GERD  Medicated,  Endo/Other  negative endocrine ROSdiabetes  Renal/GU Renal disease  negative genitourinary   Musculoskeletal   Abdominal   Peds  Hematology negative hematology ROS (+)   Anesthesia Other Findings   Reproductive/Obstetrics negative OB ROS                             Anesthesia Physical Anesthesia Plan  ASA: 4  Anesthesia Plan: General LMA   Post-op Pain Management:    Induction:   PONV Risk Score and Plan: 3  Airway Management Planned:   Additional Equipment:   Intra-op Plan:   Post-operative Plan:   Informed Consent: I have reviewed the patients History and Physical, chart, labs and discussed the procedure including the risks, benefits and alternatives for the proposed anesthesia with the patient or authorized representative who has indicated his/her understanding and acceptance.       Plan Discussed with: CRNA  Anesthesia Plan Comments:        Anesthesia  Quick Evaluation

## 2021-07-21 NOTE — H&P (Signed)
[name] [id number] 11/29/1970     HPI: 51 y/o insulin dependent diabetic with scalp and abdominal wall abscesses. For debridement/ drainage.   Facility-Administered Medications Prior to Admission  Medication Dose Route Frequency Provider Last Rate Last Admin   lidocaine-EPINEPHrine (XYLOCAINE W/EPI) 2 %-1:100000 (with pres) injection 1.5 mL  1.5 mL Intradermal Once Michela Pitcher, NP       Medications Prior to Admission  Medication Sig Dispense Refill Last Dose   albuterol (VENTOLIN HFA) 108 (90 Base) MCG/ACT inhaler Inhale 2 puffs into the lungs every 6 (six) hours as needed for wheezing or shortness of breath.   07/21/2021 at 0800   ALPRAZolam (XANAX) 1 MG tablet Take 1 mg by mouth 4 (four) times daily as needed for anxiety.    07/20/2021   anastrozole (ARIMIDEX) 1 MG tablet Take 1 tablet (1 mg total) by mouth daily. 30 tablet 3 07/21/2021 at 0800   aspirin EC 81 MG tablet Take 81 mg by mouth daily before breakfast.   07/20/2021   budesonide-formoterol (SYMBICORT) 80-4.5 MCG/ACT inhaler INHALE 2 PUFFS BY MOUTH EVERY 12 HOURS TO PREVENT COUGH OR WHEEZING *RINSE MOUTH AFTER EACH USE* 10.2 g 2 07/21/2021 at 0800   busPIRone (BUSPAR) 30 MG tablet Take 30 mg by mouth 2 (two) times daily.   07/21/2021   citalopram (CELEXA) 20 MG tablet Take 1 tablet (20 mg total) by mouth daily. 30 tablet 2 07/21/2021 at 0800   desvenlafaxine (PRISTIQ) 100 MG 24 hr tablet Take 100 mg by mouth daily.   07/21/2021 at 0800   Empagliflozin-metFORMIN HCl ER 25-1000 MG TB24 Take 1 tablet by mouth daily with breakfast.   07/21/2021 at 0800   insulin regular human CONCENTRATED (HUMULIN R) 500 UNIT/ML injection Inject 30 Units into the skin 3 (three) times daily with meals. Sliding scale      mupirocin ointment (BACTROBAN) 2 % Apply 1 application topically 2 (two) times daily. 22 g 0    OXYGEN Inhale 2-4 L into the lungs 2 (two) times daily as needed (shortness of breath).      pregabalin (LYRICA) 300 MG capsule Take 1  capsule (300 mg total) by mouth 2 (two) times daily. For nerve pain. 180 capsule 0 07/21/2021 at 0800   rOPINIRole (REQUIP) 1 MG tablet TAKE 1 TABLET BY MOUTH EVERY DAY AT BEDTIME FOR  RESTLESS  LEG 90 tablet 0 07/20/2021   rosuvastatin (CRESTOR) 20 MG tablet Take 1 tablet (20 mg total) by mouth daily. For cholesterol. 90 tablet 3 07/20/2021   spironolactone (ALDACTONE) 50 MG tablet Take 50 mg by mouth daily.   07/20/2021   traZODone (DESYREL) 100 MG tablet Take 200 mg by mouth at bedtime.   07/20/2021   blood glucose meter kit and supplies Use up to four times daily as directed. (FOR ICD-10 E10.9, E11.9). 1 each 0    diclofenac Sodium (VOLTAREN) 1 % GEL Apply 2 g topically 3 (three) times daily as needed. For pain. (Patient not taking: Reported on 07/21/2021) 100 g 0 Not Taking   EYLEA 2 MG/0.05ML SOSY       glucose blood test strip Use as instructed 300 each 0    Insulin Pen Needle 32G X 4 MM MISC Used to give insulin injections twice daily. 100 each 11    Insulin Syringe-Needle U-100 (GLOBAL INJECT EASE INSULIN SYR) 30G X 1/2" 1 ML MISC See admin instructions.      ReliOn Ultra Thin Lancets 30G MISC Use as directed for blood sugar  checks. 300 each 0    ULTICARE MINI PEN NEEDLES 31G X 6 MM MISC USE AS DIRECTED 3 TIMES DAILY 100 each 0    Allergies  Allergen Reactions   Adhesive [Tape] Rash and Other (See Comments)    TAKES OFF THE SKIN (CERTAIN MEDICAL TAPES DO THIS!!)   Metoprolol Shortness Of Breath    Occurrence of shortness of breath after 3 days   Montelukast Shortness Of Breath   Morphine Sulfate Anaphylaxis, Shortness Of Breath and Nausea And Vomiting    Swollen Throat - Able to tolerate dilaudid   Penicillins Anaphylaxis, Hives and Shortness Of Breath    Throat swells Has patient had a PCN reaction causing immediate rash, facial/tongue/throat swelling, SOB or lightheadedness with hypotension: Yes Has patient had a PCN reaction causing severe rash involving mucus membranes or skin  necrosis: No Has patient had a PCN reaction that required hospitalization: Yes Has patient had a PCN reaction occurring within the last 10 years: No If all of the above answers are "NO", then may proceed with Cephalosporin use.    Diltiazem Swelling   Gabapentin Swelling   Midodrine     Lightheaded and falling down   Past Medical History:  Diagnosis Date   Anxiety    takes Prozac daily   Anxiety    Aortic valve calcification    Asthma    Advair and Spirva daily   Asthma    Bipolar disorder (HCC)    Breast cancer, right breast ([place]) 12/23/2019   CAD (coronary artery disease)    a. LHC 11/2013 done for CP/fluid retention: mild disease in prox LAD, mild-mod disease in mRCA, EF 60% with normal LVEDP. b. Normal nuc 03/2016.   Cancer ([place])    Cellulitis and abscess of trunk 03/12/2020   CHF (congestive heart failure) (HCC)    Chronic diastolic CHF (congestive heart failure) (HCC)    Chronic heart failure with preserved ejection fraction ([place]) 11/16/2018   CKD (chronic kidney disease), stage II    COPD (chronic obstructive pulmonary disease) (HCC)    a. nocturnal O2.   COPD (chronic obstructive pulmonary disease) (HCC)    Coronary artery disease    Decreased urine stream    Diabetes mellitus    Diabetes mellitus without complication (HCC)    Dyspnea    Elevated C-reactive protein (CRP) 01/29/2018   Elevated sedimentation rate 01/29/2018   Elevated serum hCG 01/19/2018   Family history of adverse reaction to anesthesia    mom gets nauseated   Foot pain, bilateral 05/22/2018   GERD (gastroesophageal reflux disease)    takes Pepcid daily   History of blood clots    left leg 3-31yr ago   Hyperlipidemia    Hypertension    Hypertriglyceridemia    Inguinal hernia, left 01/2015   Muscle spasm    Open wound of genital labia    Peripheral neuropathy    RBBB    [place]    Sepsis ([facility] 01/19/2018   Sinus tachycardia    a. persistent since 2009.   [place]' [place] ([facility]    Status  post mastectomy, right 01/14/2020   Stroke ([facility] 1989   left sided weakness   TIA (transient ischemic attack)    Tobacco abuse    Vulvar abscess 01/23/2018   Past Surgical History:  Procedure Laterality Date   APPLICATION OF WOUND VAC Right 01/29/2020   Procedure: APPLICATION OF WOUND VAC;  Surgeon: RRonny Bacon MD;  Location: [facility] ORS;  Service: General;  Laterality:  Right;   BREAST BIOPSY Right 11/06/2019   [place] core path pending venus clip   HERNIA REPAIR Left    INCISION AND DRAINAGE ABSCESS N/A 01/26/2018   Procedure: INCISION AND DEBRIDEMENT OF VULVAR NECROTIZING SOFT TISSUE INFECTION;  Surgeon: Greer Pickerel, MD;  Location: [place];  Service: General;  Laterality: N/A;   INCISION AND DRAINAGE PERIRECTAL ABSCESS N/A 01/22/2018   Procedure: IRRIGATION AND DEBRIDEMENT LABIAL/VULVAR AREA;  Surgeon: Coralie Keens, MD;  Location: [place];  Service: General;  Laterality: N/A;   INCISION AND DRAINAGE PERIRECTAL ABSCESS N/A 01/29/2018   Procedure: IRRIGATION AND DEBRIDEMENT VULVA;  Surgeon: Excell Seltzer, MD;  Location: [place];  Service: General;  Laterality: N/A;   INGUINAL HERNIA REPAIR Left 04/08/2015   Procedure: OPEN LEFT INGUINAL HERNIA REPAIR WITH MESH;  Surgeon: Ralene Ok, MD;  Location: [place];  Service: General;  Laterality: Left;   INSERTION OF MESH Left 04/08/2015   Procedure: INSERTION OF MESH;  Surgeon: Ralene Ok, MD;  Location: [place];  Service: General;  Laterality: Left;   LAPAROSCOPY     Endometriosis   LEFT HEART CATHETERIZATION WITH CORONARY ANGIOGRAM N/A 12/05/2013   Procedure: LEFT HEART CATHETERIZATION WITH CORONARY ANGIOGRAM;  Surgeon: Jettie Booze, MD;  Location: [facility];  Service: Cardiovascular;  Laterality: N/A;   MASTECTOMY Right    right kidney drained     SIMPLE MASTECTOMY WITH AXILLARY SENTINEL NODE BIOPSY Right 12/23/2019   Procedure: Right SIMPLE MASTECTOMY WITH AXILLARY SENTINEL NODE BIOPSY;  Surgeon: Ronny Bacon, MD;  Location:  [facility] OR[facility];  Service: General;  Laterality: Right;   TEE WITHOUT CARDIOVERSION N/A 11/28/2013   Procedure: TRANSESOPHAGEAL ECHOCARDIOGRAM (TEE);  Surgeon: Thayer Headings, MD;  Location: [place];  Service: Cardiovascular;  Laterality: N/A;   WOUND DEBRIDEMENT Right 01/29/2020   Procedure: DEBRIDEMENT WOUND, Excisional debridement skin, subcutaneous and muscle right chest wall;  Surgeon: Ronny Bacon, MD;  Location: [facility] OR[facility];  Service: General;  Laterality: Right;   Social History   Socioeconomic History   Marital status: Married    Spouse name: Not on file   Number of children: 2   Years of education: Not on file   Highest education level: Not on file  Occupational History   Not on file  Tobacco Use   Smoking status: Some Days    Packs/day: 0.25    Years: 36.00    Pack years: 9.00    Types: Cigarettes    Start date: 07/11/1981   Smokeless tobacco: Never   Tobacco comments:    1 ppd now  Vaping Use   Vaping Use: Some days  Substance and Sexual Activity   Alcohol use: No   Drug use: No   Sexual activity: Not Currently  Other Topics Concern   Not on file  Social History Narrative   ** Merged History Encounter **       Lives in [place]   Married but lives with Boyfriend - not legally separated   Disabled - for BiPolar, Seizure disorder, diabetes   Formerly worked at [employer] 1 ppd; no alcohol. 2 step sons; no biologic children.       Social Determinants of Health   Financial Resource Strain: Not on file  Food Insecurity: No Food Insecurity   Worried About Charity fundraiser in the Last Year: Never true   Ran Out of Food in the Last Year: Never true  Transportation Needs: Not on file  Physical Activity: Not  on file  Stress: Not on file  Social Connections: Not on file  Intimate Partner Violence: Not on file   Social History   Social History Narrative   ** Merged History Encounter **       Lives in [place]   Married but lives with Boyfriend -  not legally separated   Disabled - for BiPolar, Seizure disorder, diabetes   Formerly worked at [employer] 1 ppd; no alcohol. 2 step sons; no biologic children.         ROS: Negative.     PE: HEENT: Negative. Lungs: Clear. Cardio: RR.  Assessment/Plan:  Proceed with planned I&D.   [facility] 07/21/2021

## 2021-07-21 NOTE — Anesthesia Procedure Notes (Signed)
Procedure Name: LMA Insertion Date/Time: 07/21/2021 12:54 PM Performed by: Doreen Salvage, CRNA Pre-anesthesia Checklist: Patient identified, Patient being monitored, Timeout performed, Emergency Drugs available and Suction available Patient Re-evaluated:Patient Re-evaluated prior to induction Oxygen Delivery Method: Circle system utilized Preoxygenation: Pre-oxygenation with 100% oxygen Induction Type: IV induction Ventilation: Mask ventilation without difficulty LMA: LMA inserted LMA Size: 4.0 Tube type: Oral Number of attempts: 1 Placement Confirmation: positive ETCO2 and breath sounds checked- equal and bilateral Tube secured with: Tape Dental Injury: Teeth and Oropharynx as per pre-operative assessment

## 2021-07-22 ENCOUNTER — Encounter: Payer: Self-pay | Admitting: General Surgery

## 2021-07-22 DIAGNOSIS — L03012 Cellulitis of left finger: Secondary | ICD-10-CM | POA: Diagnosis not present

## 2021-07-26 DIAGNOSIS — J449 Chronic obstructive pulmonary disease, unspecified: Secondary | ICD-10-CM | POA: Diagnosis not present

## 2021-07-26 LAB — SURGICAL PATHOLOGY

## 2021-07-27 ENCOUNTER — Ambulatory Visit: Payer: Medicare Other | Admitting: Primary Care

## 2021-07-27 DIAGNOSIS — L02811 Cutaneous abscess of head [any part, except face]: Secondary | ICD-10-CM | POA: Diagnosis not present

## 2021-07-27 DIAGNOSIS — E8589 Other amyloidosis: Secondary | ICD-10-CM | POA: Diagnosis not present

## 2021-07-27 LAB — AEROBIC/ANAEROBIC CULTURE W GRAM STAIN (SURGICAL/DEEP WOUND)

## 2021-07-27 NOTE — Anesthesia Postprocedure Evaluation (Signed)
Anesthesia Post Note  Patient: Conservator, museum/gallery  Procedure(s) Performed: INCISION AND DRAINAGE ABSCESS Scalp IRRIGATION AND DEBRIDEMENT ABSCESS abdomen (Abdomen)  Patient location during evaluation: PACU Anesthesia Type: General Level of consciousness: awake and alert Pain management: pain level controlled Vital Signs Assessment: post-procedure vital signs reviewed and stable Respiratory status: spontaneous breathing, nonlabored ventilation, respiratory function stable and patient connected to nasal cannula oxygen Cardiovascular status: blood pressure returned to baseline and stable Postop Assessment: no apparent nausea or vomiting Anesthetic complications: no   No notable events documented.   Last Vitals:  Vitals:   07/21/21 1445 07/21/21 1451  BP: 117/63 (!) 108/55  Pulse: 75 74  Resp: 20 19  Temp: (!) 36.3 C (!) 36.1 C  SpO2: 94% 98%    Last Pain:  Vitals:   07/22/21 1636  TempSrc:   PainSc: Ione Leily Capek

## 2021-08-03 ENCOUNTER — Telehealth: Payer: Self-pay | Admitting: Internal Medicine

## 2021-08-03 NOTE — Telephone Encounter (Signed)
Patient last seen 10/08/2019. No pending appt. Our office has not previously ordered or qualified patient for oxygen. Patient will need appt.   Lm for [employer] with [employer]

## 2021-08-04 NOTE — Telephone Encounter (Signed)
Lm x2 for [employer]. Spoke to patient and scheduled visit for 09/03/21. Patient declined sooner appt at [facility] office, Nothing further needed at this time.

## 2021-08-05 IMAGING — CT CT HEAD W/O CM
3 series · 15 of 45 positions shown, 18 images · non-contrast
Comparison: August 06, 2020.

CLINICAL DATA: Altered mental status.  Headache.

EXAM:
CT HEAD WITHOUT CONTRAST
TECHNIQUE: Contiguous axial images were obtained from the base of the skull
through the vertex without intravenous contrast.

[Series 2: head wo · axial · 0.40mm/px · z∈[+237,+352]mm · 9 of 28 slices shown, 12 images]
[im 3/28  brain]
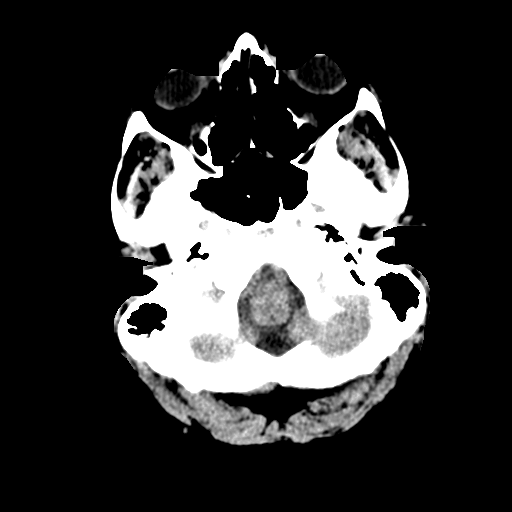
[im 3/28  bone]
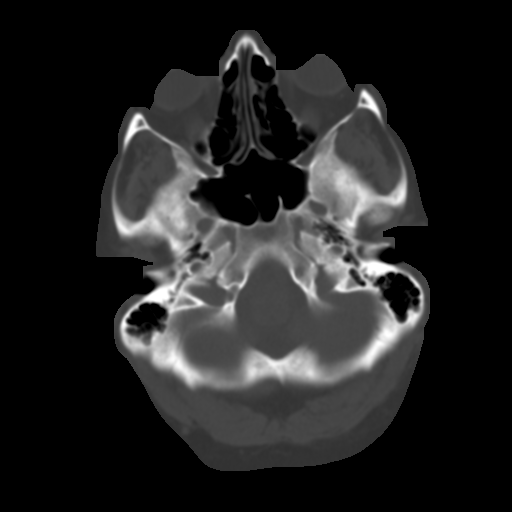
[im 6/28  brain]
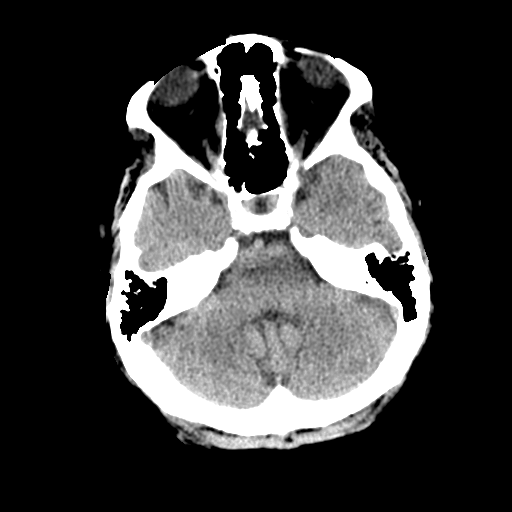
[im 9/28  brain]
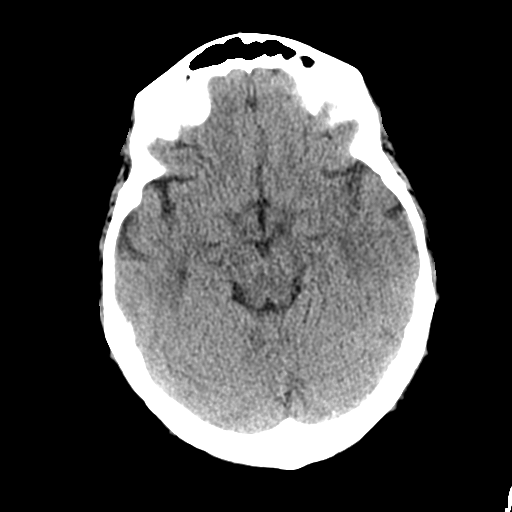
[im 12/28  brain]
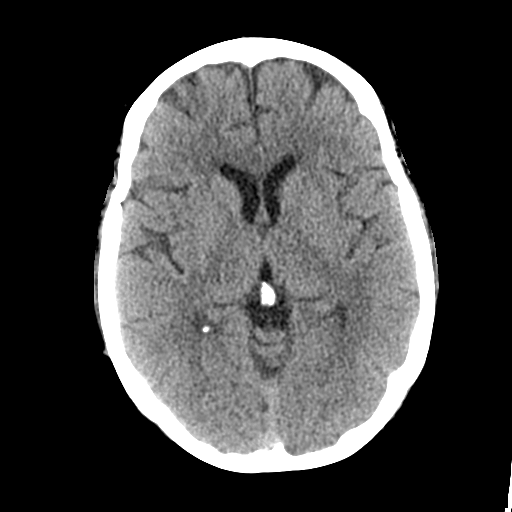
[im 15/28  brain]
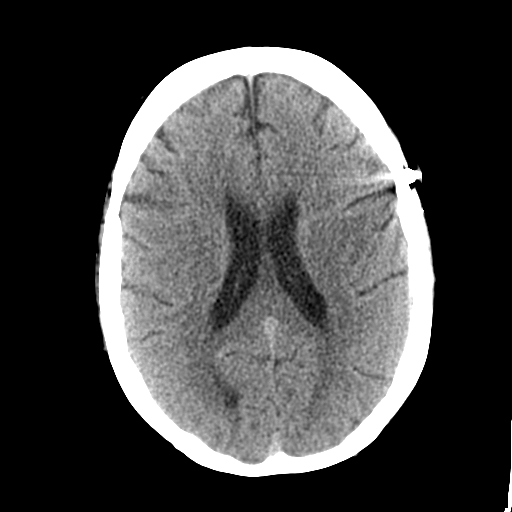
[im 15/28  bone]
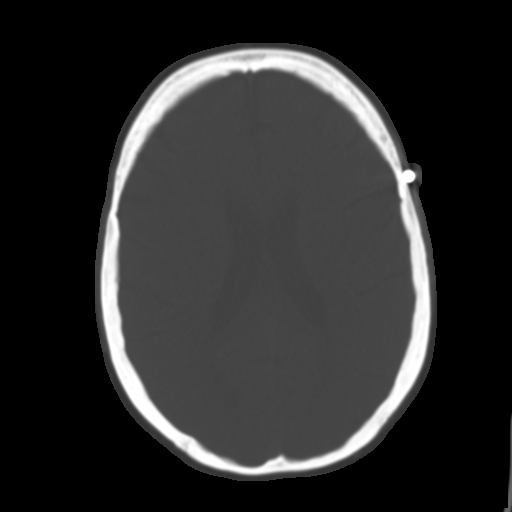
[im 17/28  brain]
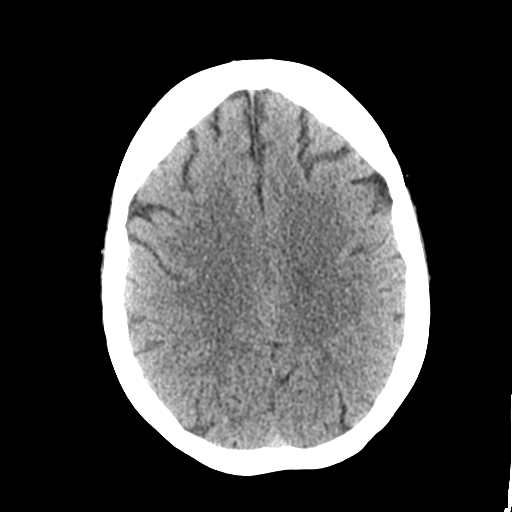
[im 20/28  brain]
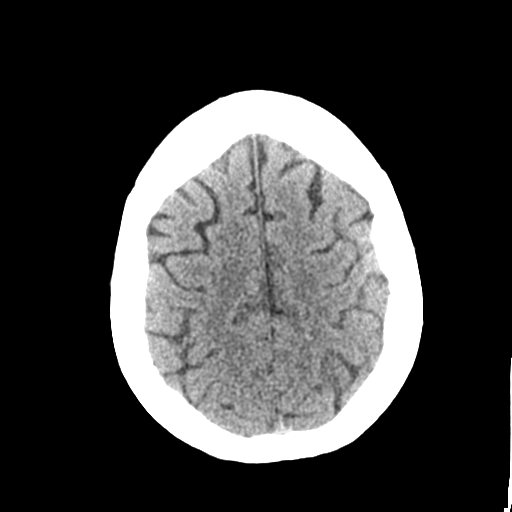
[im 23/28  brain]
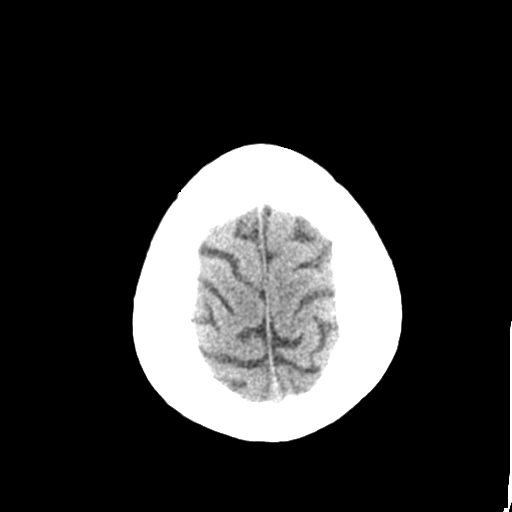
[im 26/28  brain]
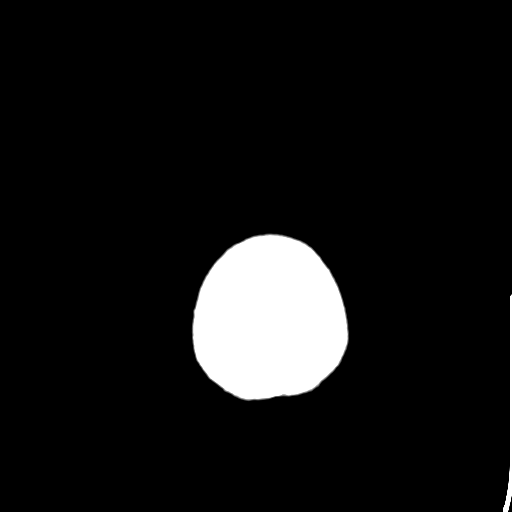
[im 26/28  bone]
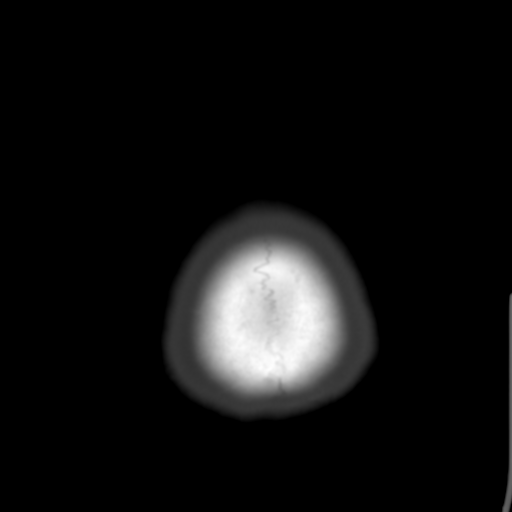

[Series 4: coronal soft tissue · coronal · 0.27mm/px · 3 of 63 slices shown]
[im 21/63  brain]
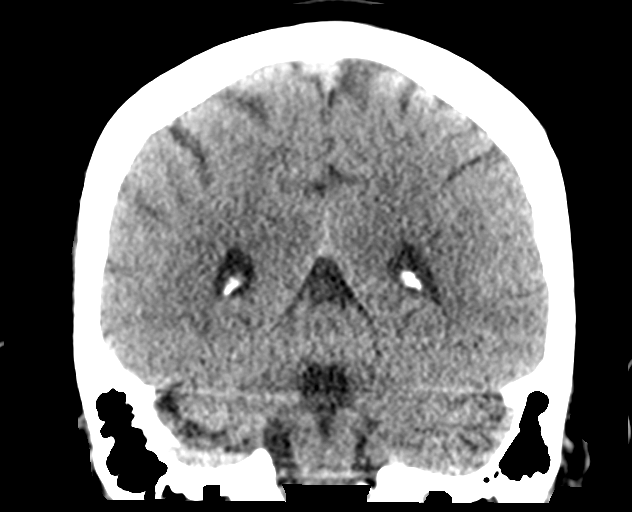
[im 28/63  brain]
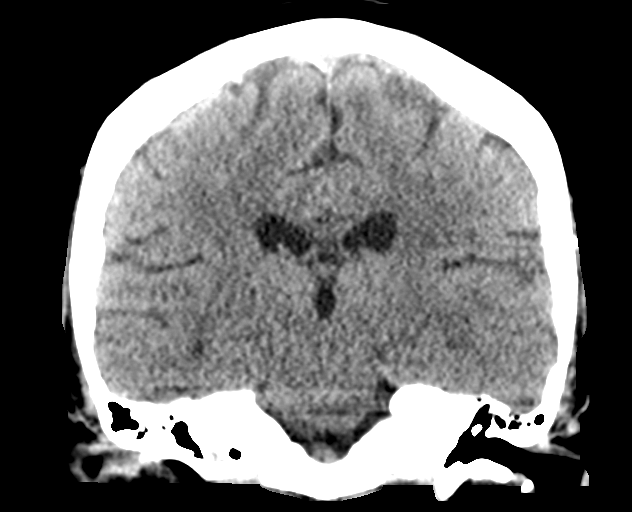
[im 35/63  brain]
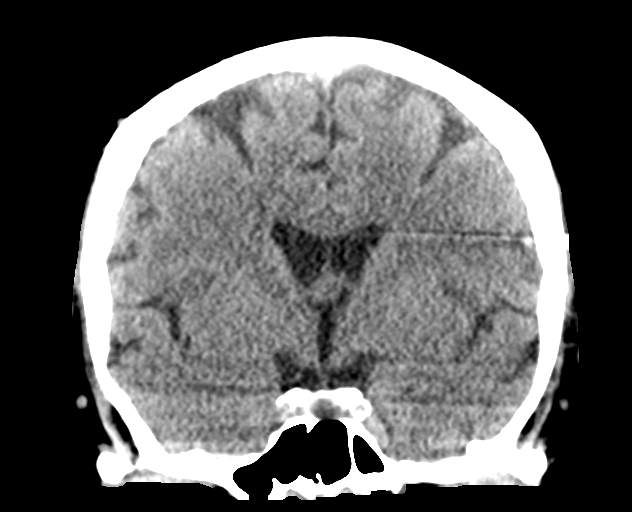

[Series 5: sagittal soft tissue · sagittal · 0.28mm/px · 3 of 49 slices shown]
[im 17/49  brain]
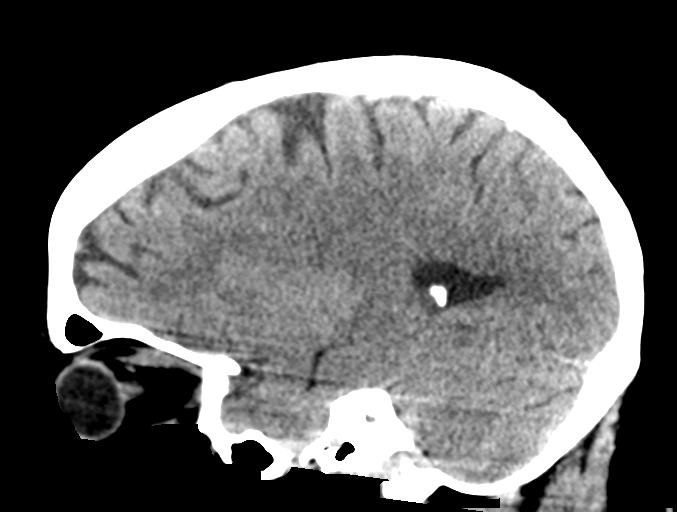
[im 25/49  brain]
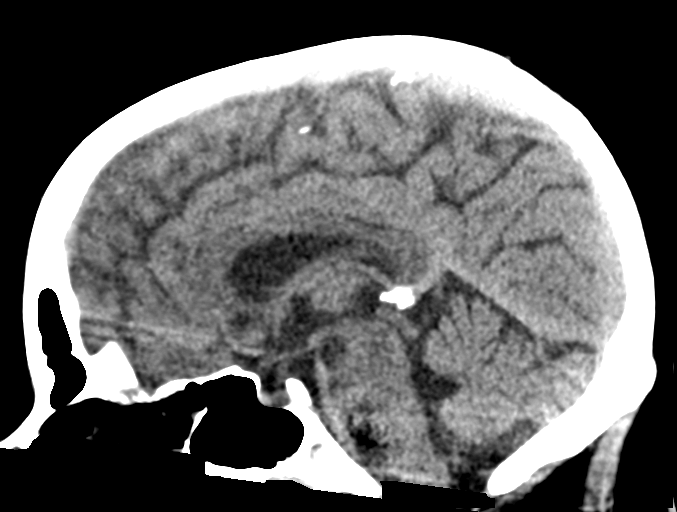
[im 33/49  brain]
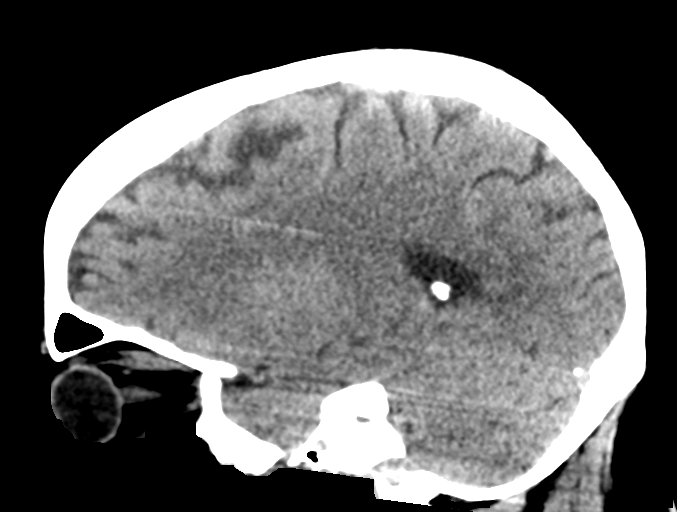

[15 of 45 positions shown; findings below may reference images not displayed]

FINDINGS: Brain: No evidence of acute infarction, hemorrhage, hydrocephalus,
extra-axial collection or mass lesion/mass effect.

Vascular: No hyperdense vessel or unexpected calcification.

Skull: Normal. Negative for fracture or focal lesion.

Sinuses/Orbits: No acute finding.

Other: Stable metallic foreign body is seen in left frontal scalp.
IMPRESSION: No acute intracranial abnormality seen.

## 2021-08-05 IMAGING — DX DG CHEST 1V
1 series · 1 of 1 positions shown · non-contrast
Comparison: 07/16/2020

CLINICAL DATA: Weakness. Syncopal episode. History of breast
cancer.

EXAM:
CHEST  1 VIEW

[chest ap]
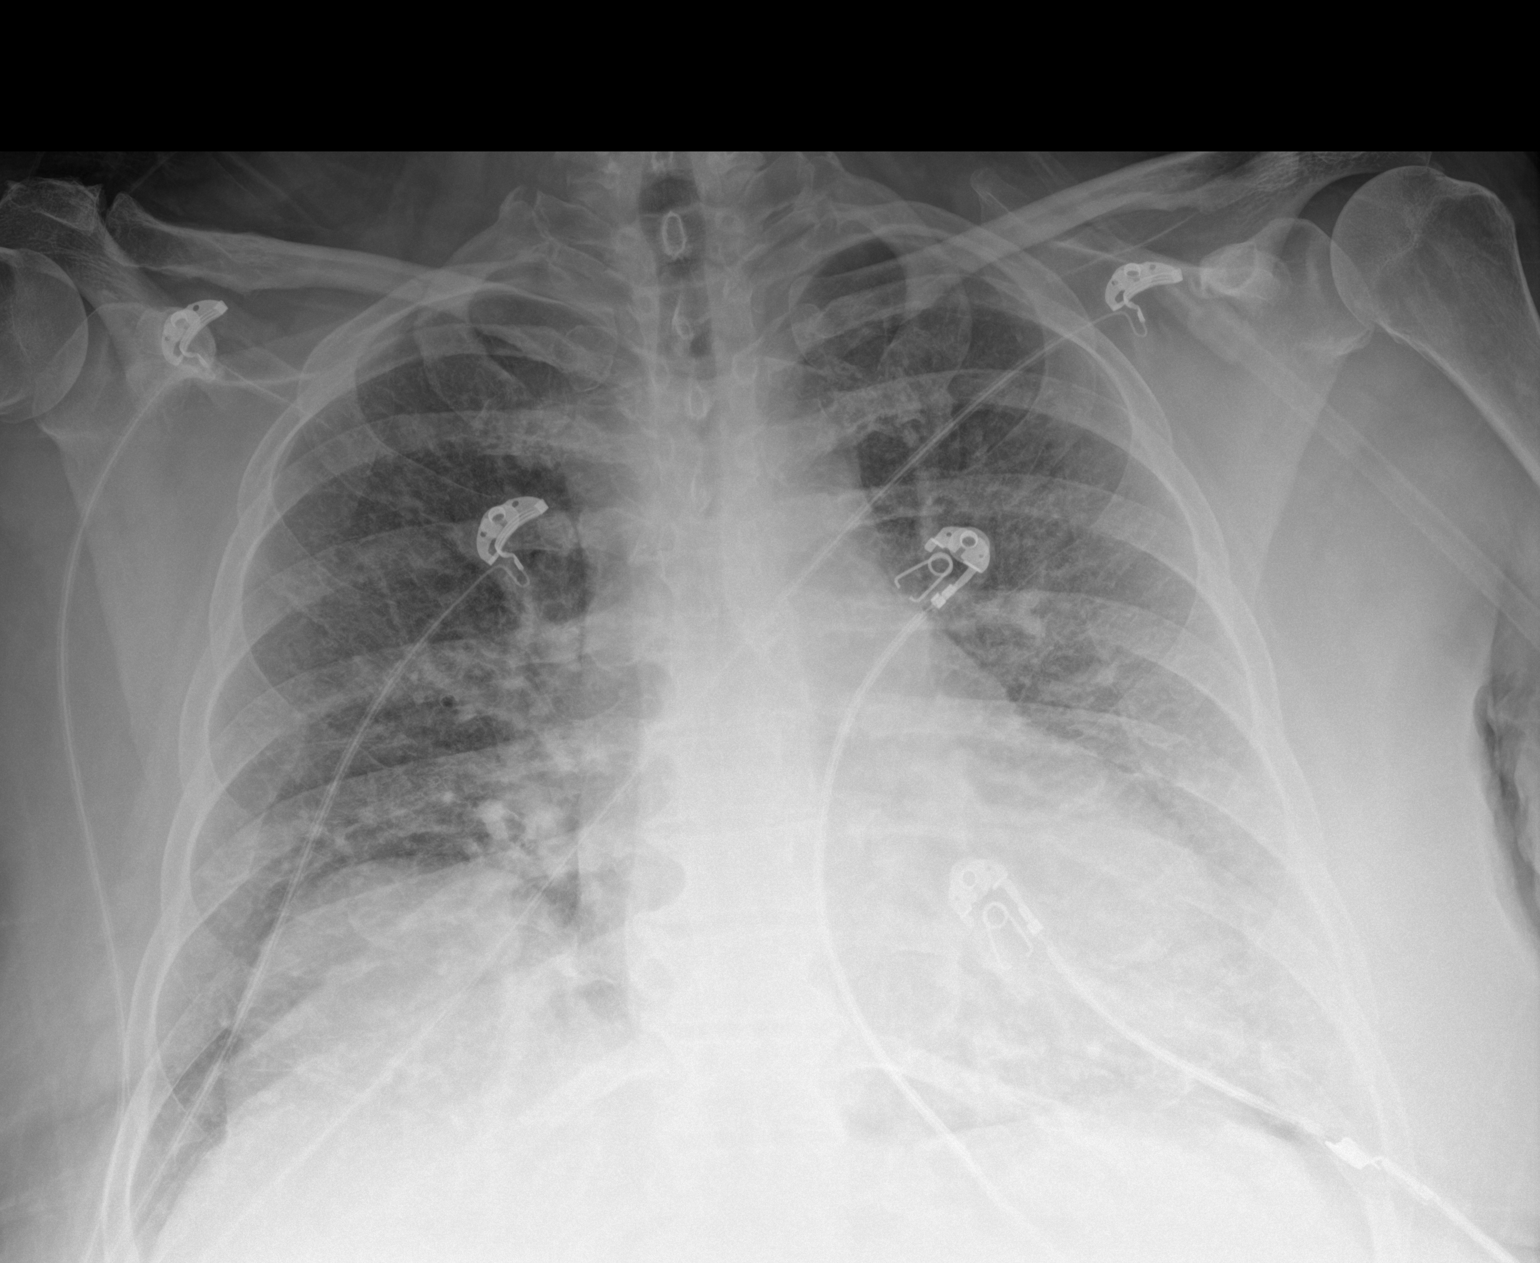

[1 of 1 positions shown; findings below may reference images not displayed]

FINDINGS: Heart size is normal. Chronic interstitial lung markings as seen
chronically. No consolidation, collapse or effusion. No acute bone
finding.
IMPRESSION: No active disease. Chronic interstitial lung markings.

## 2021-08-10 ENCOUNTER — Telehealth: Payer: Medicare Other

## 2021-08-10 DIAGNOSIS — E1165 Type 2 diabetes mellitus with hyperglycemia: Secondary | ICD-10-CM | POA: Diagnosis not present

## 2021-08-11 ENCOUNTER — Inpatient Hospital Stay: Payer: BC Managed Care – PPO | Admitting: Internal Medicine

## 2021-08-11 ENCOUNTER — Inpatient Hospital Stay: Payer: BC Managed Care – PPO

## 2021-08-11 ENCOUNTER — Inpatient Hospital Stay: Payer: BC Managed Care – PPO | Attending: Internal Medicine

## 2021-08-11 NOTE — Progress Notes (Deleted)
[place] NOTE  Patient Care Team: Pleas Koch, NP as PCP - General (Internal Medicine) Rosamaria Lints, MD (Inactive) as Referring Physician (Neurology) Alvester Chou, NP (Nurse Practitioner) Renato Shin, MD as Consulting Physician (Endocrinology) Theodore Demark, RN as Oncology Nurse Navigator Ronny Bacon, MD as Consulting Physician (General Surgery) [place], [place] as Registered Nurse Flora Lipps, MD as Consulting Physician (Pulmonary Disease) Sherlynn Stalls, MD as Consulting Physician (Ophthalmology) Dannielle Karvonen, RN as [facility]  CHIEF COMPLAINTS/PURPOSE OF CONSULTATION: Breast cancer.  #  Oncology History Overview Note  #April 2021-2.5 cm right breast mass [incidental-CT scan chest]  # April 2021- RIGHT BREAST [facility]; s/p ER- 51-90%; PR- 51-90%; Her- 2-NEG. G-2. [Dr.Rodenberg]; s/p  Masectomy- pT2 pN0 (sn); Tamoxifen; Poor candidate for chemo;   # Discontinue tamoxifen early DEC 2021 [sec to nausea/vomiting diarrhea]  #Late December 2021- Anastrazole + Lupron 11.25 every 3 months  # DM- poorly controlled; Seizure disorder/Bipolar/TIA ; Diabetes PN- G-3 [cane/walker; falls]; COPD; OSA active smoker  # SURVIVORSHIP:   # GENETICS:   DIAGNOSIS: Right breast cancer  STAGE:    II     ;  GOALS: goal  CURRENT/MOST RECENT THERAPY : Anastrazole + Lupron 11.25 every 3 months     Carcinoma of lower-outer quadrant of right breast in female, estrogen receptor positive ([place])  11/12/2019 Initial Diagnosis   Carcinoma of lower-outer quadrant of right breast in female, estrogen receptor positive ([place])    HISTORY OF PRESENTING ILLNESS: Patient in independently.  Accompanied by husband.  [name] 51 y.o.  female premenopausal patient with right-sided stage II ER/PR positive HER-2 negative breast cancer on adjuvant anastrozole +Lupron with multiple comorbidities-including poorly controlled  diabetes/bipolar disorder/active smoker/neuropathy is here for follow-up.  Patient has been trying to lose weight.  She lost about 15 pounds in the last 1 month.  Patient continues to have elevated blood glucose;-evaluated by endocrinology.  Otherwise no fever no chills.  No nausea no vomiting.  Currently getting wig-because of continued hair loss.  Review of Systems  Constitutional:  Positive for malaise/fatigue. Negative for chills, diaphoresis, fever and weight loss.  HENT:  Negative for nosebleeds and sore throat.   Eyes:  Negative for double vision.  Respiratory:  Negative for cough, hemoptysis, sputum production, shortness of breath and wheezing.   Cardiovascular:  Negative for chest pain, palpitations, orthopnea and leg swelling.  Gastrointestinal:  Negative for abdominal pain, blood in stool, constipation, diarrhea, heartburn, melena, nausea and vomiting.  Genitourinary:  Negative for dysuria, frequency and urgency.  Musculoskeletal:  Positive for back pain, joint pain and myalgias.  Skin:  Negative for itching.  Neurological:  Positive for tingling. Negative for dizziness, focal weakness, weakness and headaches.  Endo/Heme/Allergies:  Does not bruise/bleed easily.  Psychiatric/Behavioral:  Negative for depression. The patient is not nervous/anxious and does not have insomnia.     MEDICAL HISTORY:  Past Medical History:  Diagnosis Date   Anxiety    takes Prozac daily   Anxiety    Aortic valve calcification    Asthma    Advair and Spirva daily   Asthma    Bipolar disorder ([place])    Breast cancer, right breast ([place]) 12/23/2019   CAD (coronary artery disease)    a. LHC 11/2013 done for CP/fluid retention: mild disease in prox LAD, mild-mod disease in mRCA, EF 60% with normal LVEDP. b. Normal nuc 03/2016.   Cancer (HCC)    Cellulitis and abscess of  trunk 03/12/2020   CHF (congestive heart failure) (HCC)    Chronic diastolic CHF (congestive heart failure) (HCC)     Chronic heart failure with preserved ejection fraction ([place]) 11/16/2018   CKD (chronic kidney disease), stage II    COPD (chronic obstructive pulmonary disease) (HCC)    a. nocturnal O2.   COPD (chronic obstructive pulmonary disease) (HCC)    Coronary artery disease    Decreased urine stream    Diabetes mellitus    Diabetes mellitus without complication (HCC)    Dyspnea    Elevated C-reactive protein (CRP) 01/29/2018   Elevated sedimentation rate 01/29/2018   Elevated serum hCG 01/19/2018   Family history of adverse reaction to anesthesia    mom gets nauseated   Foot pain, bilateral 05/22/2018   GERD (gastroesophageal reflux disease)    takes Pepcid daily   History of blood clots    left leg 3-4yr ago   Hyperlipidemia    Hypertension    Hypertriglyceridemia    Inguinal hernia, left 01/2015   Muscle spasm    Open wound of genital labia    Peripheral neuropathy    RBBB    [place]ures ([place]    [place] [place] 01/19/2018   Sinus tachycardia    a. persistent since 2009.   Smokers' cough ([facility]    Status post mastectomy, right 01/14/2020   Stroke ([facility] 1989   left sided weakness   TIA (transient ischemic attack)    Tobacco abuse    Vulvar abscess 01/23/2018    SURGICAL HISTORY: Past Surgical History:  Procedure Laterality Date   APPLICATION OF WOUND VAC Right 01/29/2020   Procedure: APPLICATION OF WOUND VAC;  Surgeon: RRonny Bacon MD;  Location: [facility];  Service: General;  Laterality: Right;   BREAST BIOPSY Right 11/06/2019   uKoreacore path pending venus clip   HERNIA REPAIR Left    INCISION AND DRAINAGE ABSCESS N/A 01/26/2018   Procedure: INCISION AND DEBRIDEMENT OF VULVAR NECROTIZING SOFT TISSUE INFECTION;  Surgeon: WGreer Pickerel MD;  [place] [facility]  Service: General;  Laterality: N/A;   INCISION AND DRAINAGE ABSCESS N/A 07/21/2021   Procedure: INCISION AND DRAINAGE ABSCESS Scalp;  Surgeon: BRobert Bellow MD;  [facility];   Service: General;  Laterality: N/A;  scalp and right abdomen   INCISION AND DRAINAGE PERIRECTAL ABSCESS N/A 01/22/2018   Procedure: IRRIGATION AND DEBRIDEMENT LABIAL/VULVAR AREA;  Surgeon: BCoralie Keens MD;  [place] [facility]  Service: General;  Laterality: N/A;   INCISION AND DRAINAGE PERIRECTAL ABSCESS N/A 01/29/2018   Procedure: IRRIGATION AND DEBRIDEMENT VULVA;  Surgeon: HExcell Seltzer MD;  [place] [facility]  Service: General;  Laterality: N/A;   INGUINAL HERNIA REPAIR Left 04/08/2015   Procedure: OPEN LEFT INGUINAL HERNIA REPAIR WITH MESH;  Surgeon: ARalene Ok MD;  [facility]  Service: General;  Laterality: Left;   INSERTION OF MESH Left 04/08/2015   Procedure: INSERTION OF MESH;  Surgeon: ARalene Ok MD;  [place] [facility]  Service: General;  Laterality: Left;   IRRIGATION AND DEBRIDEMENT ABSCESS N/A 07/21/2021   Procedure: IRRIGATION AND DEBRIDEMENT ABSCESS abdomen;  Surgeon: BRobert Bellow MD;  Location: [facility];  Service: General;  Laterality: N/A;   LAPAROSCOPY     Endometriosis   LEFT HEART CATHETERIZATION WITH CORONARY ANGIOGRAM N/A 12/05/2013   Procedure: LEFT HEART CATHETERIZATION WITH CORONARY ANGIOGRAM;  Surgeon: JJettie Booze MD;  [facility];  Service: Cardiovascular;  Laterality: N/A;   MASTECTOMY Right  right kidney drained     SIMPLE MASTECTOMY WITH AXILLARY SENTINEL NODE BIOPSY Right 12/23/2019   Procedure: Right SIMPLE MASTECTOMY WITH AXILLARY SENTINEL NODE BIOPSY;  Surgeon: Ronny Bacon, MD;  Location: [facility];  Service: General;  Laterality: Right;   TEE WITHOUT CARDIOVERSION N/A 11/28/2013   Procedure: TRANSESOPHAGEAL ECHOCARDIOGRAM (TEE);  Surgeon: Thayer Headings, MD;  Location: [place];  Service: Cardiovascular;  Laterality: N/A;   WOUND DEBRIDEMENT Right 01/29/2020   Procedure: DEBRIDEMENT WOUND, Excisional debridement skin, subcutaneous and muscle right chest wall;  Surgeon: Ronny Bacon, MD;   Location: [facility];  Service: General;  Laterality: Right;    SOCIAL HISTORY: Social History   Socioeconomic History   Marital status: Married    Spouse name: Not on file   Number of children: 2   Years of education: Not on file   Highest education level: Not on file  Occupational History   Not on file  Tobacco Use   Smoking status: Some Days    Packs/day: 0.25    Years: 36.00    Pack years: 9.00    Types: Cigarettes    Start date: 07/11/1981   Smokeless tobacco: Never   Tobacco comments:    1 ppd now  Vaping Use   Vaping Use: Some days  Substance and Sexual Activity   Alcohol use: No   Drug use: No   Sexual activity: Not Currently  Other Topics Concern   Not on file  Social History Narrative   ** Merged History Encounter **       Lives in [place]   Married but lives with Boyfriend - not legally separated   Disabled - for BiPolar, Seizure disorder, diabetes   Formerly worked at [employer] 1 ppd; no alcohol. 2 step sons; no biologic children.       Social Determinants of Radio broadcast assistant Strain: Not on file  Food Insecurity: No Food Insecurity   Worried About Charity fundraiser in the Last Year: Never true   Arboriculturist in the Last Year: Never true  Transportation Needs: Not on file  Physical Activity: Not on file  Stress: Not on file  Social Connections: Not on file  Intimate Partner Violence: Not on file    FAMILY HISTORY: Family History  Problem Relation Age of Onset   Venous thrombosis Brother    Other Brother        BRAIN TUMOR   Asthma Father    Diabetes Father    Coronary artery disease Mother    Hypertension Mother    Diabetes Mother    Breast cancer Mother 67   Asthma Sister    Diabetes type II Brother    Diabetes Brother     ALLERGIES:  is allergic to adhesive [tape], metoprolol, montelukast, morphine sulfate, penicillins, diltiazem, gabapentin, and midodrine.  MEDICATIONS:  Current  Outpatient Medications  Medication Sig Dispense Refill   albuterol (VENTOLIN HFA) 108 (90 Base) MCG/ACT inhaler Inhale 2 puffs into the lungs every 6 (six) hours as needed for wheezing or shortness of breath.     ALPRAZolam (XANAX) 1 MG tablet Take 1 mg by mouth 4 (four) times daily as needed for anxiety.      anastrozole (ARIMIDEX) 1 MG tablet Take 1 tablet (1 mg total) by mouth daily. 30 tablet 3   aspirin EC 81 MG tablet Take 81 mg by mouth daily before breakfast.     blood glucose meter  kit and supplies Use up to four times daily as directed. (FOR ICD-10 E10.9, E11.9). 1 each 0   budesonide-formoterol (SYMBICORT) 80-4.5 MCG/ACT inhaler INHALE 2 PUFFS BY MOUTH EVERY 12 HOURS TO PREVENT COUGH OR WHEEZING *RINSE MOUTH AFTER EACH USE* 10.2 g 2   busPIRone (BUSPAR) 30 MG tablet Take 30 mg by mouth 2 (two) times daily.     citalopram (CELEXA) 20 MG tablet Take 1 tablet (20 mg total) by mouth daily. 30 tablet 2   desvenlafaxine (PRISTIQ) 100 MG 24 hr tablet Take 100 mg by mouth daily.     Empagliflozin-metFORMIN HCl ER 25-1000 MG TB24 Take 1 tablet by mouth daily with breakfast.     EYLEA 2 MG/0.05ML SOSY      glucose blood test strip Use as instructed 300 each 0   Insulin Pen Needle 32G X 4 MM MISC Used to give insulin injections twice daily. 100 each 11   insulin regular human CONCENTRATED (HUMULIN R) 500 UNIT/ML injection Inject 30 Units into the skin 3 (three) times daily with meals. Sliding scale     Insulin Syringe-Needle U-100 (GLOBAL INJECT EASE INSULIN SYR) 30G X 1/2" 1 ML MISC See admin instructions.     mupirocin ointment (BACTROBAN) 2 % Apply 1 application topically 2 (two) times daily. 22 g 0   OXYGEN Inhale 2-4 L into the lungs 2 (two) times daily as needed (shortness of breath).     pregabalin (LYRICA) 300 MG capsule Take 1 capsule (300 mg total) by mouth 2 (two) times daily. For nerve pain. 180 capsule 0   ReliOn Ultra Thin Lancets 30G MISC Use as directed for  blood sugar checks. 300 each 0   rOPINIRole (REQUIP) 1 MG tablet TAKE 1 TABLET BY MOUTH EVERY DAY AT BEDTIME FOR  RESTLESS  LEG 90 tablet 0   rosuvastatin (CRESTOR) 20 MG tablet Take 1 tablet (20 mg total) by mouth daily. For cholesterol. 90 tablet 3   spironolactone (ALDACTONE) 50 MG tablet Take 50 mg by mouth daily.     traMADol (ULTRAM) 50 MG tablet Take 1 tablet (50 mg total) by mouth every 6 (six) hours as needed. 20 tablet 0   traZODone (DESYREL) 100 MG tablet Take 200 mg by mouth at bedtime.     ULTICARE MINI PEN NEEDLES 31G X 6 MM MISC USE AS DIRECTED 3 TIMES DAILY 100 each 0   Current Facility-Administered Medications  Medication Dose Route Frequency Provider Last Rate Last Admin   lidocaine-EPINEPHrine (XYLOCAINE W/EPI) 2 %-1:100000 (with pres) injection 1.5 mL  1.5 mL Intradermal Once Michela Pitcher, NP          .  PHYSICAL EXAMINATION: ECOG PERFORMANCE STATUS: 1 - Symptomatic but completely ambulatory  There were no vitals filed for this visit.  There were no vitals filed for this visit.   Physical Exam HENT:     Head: Normocephalic and atraumatic.     Mouth/Throat:     Pharynx: No oropharyngeal exudate.  Eyes:     Pupils: Pupils are equal, round, and reactive to light.  Cardiovascular:     Rate and Rhythm: Normal rate and regular rhythm.  Pulmonary:     Effort: No respiratory distress.     Breath sounds: No wheezing.     Comments: Decreased breath sounds bilaterally the bases. Abdominal:     General: Bowel sounds are normal. There is no distension.     Palpations: Abdomen is soft. There is no mass.     Tenderness:  There is no abdominal tenderness. There is no guarding or rebound.  Musculoskeletal:        General: No tenderness. Normal range of motion.     Cervical back: Normal range of motion and neck supple.  Skin:    General: Skin is warm.  Neurological:     Mental Status: She is alert and oriented to person, place, and time.  Psychiatric:         Mood and Affect: Affect normal.     LABORATORY DATA:  I have reviewed the data as listed Lab Results  Component Value Date   WBC 7.3 07/21/2021   HGB 12.4 07/21/2021   HCT 37.5 07/21/2021   MCV 88.9 07/21/2021   PLT 193 07/21/2021   Recent Labs    03/10/21 1018 04/14/21 0853 05/12/21 1015 07/21/21 1212  NA 131* 133* 135 135  K 4.5 4.6 4.6 4.2  CL 98 99 98 104  CO2 25 27 27 26   GLUCOSE 403* 416* 317* 255*  BUN 23* 29* 25* 21*  CREATININE 1.05* 1.24* 1.04* 0.95  CALCIUM 9.0 9.1 9.1 8.9  GFRNONAA >60 52* >60 >60  PROT 8.8* 9.4* 8.6*  --   ALBUMIN 3.9 4.0 3.8  --   AST 16 16 14*  --   ALT 18 18 18   --   ALKPHOS 170* 166* 191*  --   BILITOT 0.8 1.0 0.8  --         RADIOGRAPHIC STUDIES: I have personally reviewed the radiological images as listed and agreed with the findings in the report. No results found.   ASSESSMENT & PLAN:   No problem-specific Assessment & Plan notes found for this encounter.  All questions were answered. The patient knows to call the clinic with any problems, questions or concerns.    Cammie Sickle, MD 08/11/2021 8:19 AM

## 2021-08-11 NOTE — Assessment & Plan Note (Deleted)
#   T2N0- ER/PR-positive; premenopausal currently on Anastrazole [stopped tam sec to poor tolerance].  Continue  EligardDepo 11.25 mg every 3 months. CT AUG, 29th 2022- CT C/A/P- NED.   #Continue anastrozole + Eligard for now.  Tolerating fairly well.  Eligard- today  # Headache/migraines-awaiting awaiting neurology evaluation do not suspect for malignancy; discussed with PCP. STABLE.    #Depression/anxiety/bipolar-currently on BuSpar/Celexa/trazodone As per psychiatry- STABLE.-  #Chronic respiratory failure-CHF/COPD; STABLE; SEP 2022-CT scan [bronchiolitis from smoking]; discussed at length regarding quitting smoking/ states cutting down.   #Poorly controlled diabetes-on insulin; FBG AM- 317- [Dr.O'Connell endocrinology]-  Again stressed the importance of glucose control-STABLE; congratulated the patient on weight loss.  Continue-dietary modification/increasing activity.  #Peripheral neuropathy grade 2-3; patient goes on with a walker/cane at baseline- STABLE.   #Genetics: mom- breast cancer; older brother at 73s- brain tumor; dad- brain tumor. # Genetics: Discussed with the patient majority of breast cancers are sporadic however 10 to 20% at risk of genetic/hereditary cancer syndromes.  Discussed importance of genetic counseling/genetic testing.  Patient interested in genetic counseling.  We will make a referral.   DISPOSITION:  # Eligard today # refer to genetics re; breast cancer /coordinate appointment with next  office visit.  # follow up in 3 month- MD; labs- cbc/cmp/ca-27-29; Eligard-Dr.B

## 2021-08-16 ENCOUNTER — Telehealth: Payer: Self-pay | Admitting: Primary Care

## 2021-08-16 DIAGNOSIS — G2581 Restless legs syndrome: Secondary | ICD-10-CM

## 2021-08-16 NOTE — Telephone Encounter (Signed)
Called pt and got her scheduled for 5/25 @240 

## 2021-08-16 NOTE — Telephone Encounter (Signed)
I would like to see patient for general follow up in late May 2023. Needs to be scheduled at the end of a session.

## 2021-08-26 DIAGNOSIS — J449 Chronic obstructive pulmonary disease, unspecified: Secondary | ICD-10-CM | POA: Diagnosis not present

## 2021-08-30 ENCOUNTER — Telehealth: Payer: Self-pay | Admitting: *Deleted

## 2021-08-30 ENCOUNTER — Ambulatory Visit (INDEPENDENT_AMBULATORY_CARE_PROVIDER_SITE_OTHER): Payer: BC Managed Care – PPO

## 2021-08-30 DIAGNOSIS — I10 Essential (primary) hypertension: Secondary | ICD-10-CM

## 2021-08-30 DIAGNOSIS — I5032 Chronic diastolic (congestive) heart failure: Secondary | ICD-10-CM

## 2021-08-30 DIAGNOSIS — E119 Type 2 diabetes mellitus without complications: Secondary | ICD-10-CM

## 2021-08-30 NOTE — Chronic Care Management (AMB) (Signed)
Chronic Care Management   CCM RN Visit Note  09/01/2021 Name: [name] MRN: [id number] DOB: Nov 19, 1970  Subjective: [name] is a 51 y.o. year old female who is a primary care patient of Pleas Koch, NP. The care management team was consulted for assistance with disease management and care coordination needs.    Engaged with patient by telephone for follow up visit in response to provider referral for case management and/or care coordination services.   Consent to Services:  The patient was given information about Chronic Care Management services, agreed to services, and gave verbal consent prior to initiation of services.  Please see initial visit note for detailed documentation.   Patient agreed to services and verbal consent obtained.   Assessment: Review of patient past medical history, allergies, medications, health status, including review of consultants reports, laboratory and other test data, was performed as part of comprehensive evaluation and provision of chronic care management services.   SDOH (Social Determinants of Health) assessments and interventions performed:    CCM Care Plan  Allergies  Allergen Reactions   Adhesive [Tape] Rash and Other (See Comments)    TAKES OFF THE SKIN (CERTAIN MEDICAL TAPES DO THIS!!)   Metoprolol Shortness Of Breath    Occurrence of shortness of breath after 3 days   Montelukast Shortness Of Breath   Morphine Sulfate Anaphylaxis, Shortness Of Breath and Nausea And Vomiting    Swollen Throat - Able to tolerate dilaudid   Penicillins Anaphylaxis, Hives and Shortness Of Breath    Throat swells Has patient had a PCN reaction causing immediate rash, facial/tongue/throat swelling, SOB or lightheadedness with hypotension: Yes Has patient had a PCN reaction causing severe rash involving mucus membranes or skin necrosis: No Has patient had a PCN reaction that required hospitalization: Yes Has patient had a PCN reaction occurring  within the last 10 years: No If all of the above answers are "NO", then may proceed with Cephalosporin use.    Diltiazem Swelling   Gabapentin Swelling   Midodrine     Lightheaded and falling down    Outpatient Encounter Medications as of 08/30/2021  Medication Sig Note   albuterol (VENTOLIN HFA) 108 (90 Base) MCG/ACT inhaler Inhale 2 puffs into the lungs every 6 (six) hours as needed for wheezing or shortness of breath.    ALPRAZolam (XANAX) 1 MG tablet Take 1 mg by mouth 4 (four) times daily as needed for anxiety.     anastrozole (ARIMIDEX) 1 MG tablet Take 1 tablet (1 mg total) by mouth daily.    aspirin EC 81 MG tablet Take 81 mg by mouth daily before breakfast.    blood glucose meter kit and supplies Use up to four times daily as directed. (FOR ICD-10 E10.9, E11.9).    budesonide-formoterol (SYMBICORT) 80-4.5 MCG/ACT inhaler INHALE 2 PUFFS BY MOUTH EVERY 12 HOURS TO PREVENT COUGH OR WHEEZING *RINSE MOUTH AFTER EACH USE*    busPIRone (BUSPAR) 30 MG tablet Take 30 mg by mouth 2 (two) times daily.    citalopram (CELEXA) 20 MG tablet Take 1 tablet (20 mg total) by mouth daily. 02/15/2021: Patient states she takes 2 per day.   desvenlafaxine (PRISTIQ) 100 MG 24 hr tablet Take 100 mg by mouth daily.    Empagliflozin-metFORMIN HCl ER 25-1000 MG TB24 Take 1 tablet by mouth daily with breakfast.    EYLEA 2 MG/0.05ML SOSY     glucose blood test strip Use as instructed    Insulin Pen Needle 32G X 4  MM MISC Used to give insulin injections twice daily.    insulin regular human CONCENTRATED (HUMULIN R) 500 UNIT/ML injection Inject 30 Units into the skin 3 (three) times daily with meals. Sliding scale    Insulin Syringe-Needle U-100 (GLOBAL INJECT EASE INSULIN SYR) 30G X 1/2" 1 ML MISC See admin instructions.    mupirocin ointment (BACTROBAN) 2 % Apply 1 application topically 2 (two) times daily.    OXYGEN Inhale 2-4 L into the lungs 2 (two) times daily as needed (shortness of breath).    pregabalin  (LYRICA) 300 MG capsule Take 1 capsule (300 mg total) by mouth 2 (two) times daily. For nerve pain.    ReliOn Ultra Thin Lancets 30G MISC Use as directed for blood sugar checks.    rOPINIRole (REQUIP) 1 MG tablet TAKE 1 TABLET BY MOUTH EVERY DAY AT BEDTIME FOR  RESTLESS  LEG    rosuvastatin (CRESTOR) 20 MG tablet Take 1 tablet (20 mg total) by mouth daily. For cholesterol.    spironolactone (ALDACTONE) 50 MG tablet Take 50 mg by mouth daily.    traMADol (ULTRAM) 50 MG tablet Take 1 tablet (50 mg total) by mouth every 6 (six) hours as needed.    traZODone (DESYREL) 100 MG tablet Take 200 mg by mouth at bedtime.    ULTICARE MINI PEN NEEDLES 31G X 6 MM MISC USE AS DIRECTED 3 TIMES DAILY    Facility-Administered Encounter Medications as of 08/30/2021  Medication   lidocaine-EPINEPHrine (XYLOCAINE W/EPI) 2 %-1:100000 (with pres) injection 1.5 mL    Patient Active Problem List   Diagnosis Date Noted   Hematoma 07/13/2021   MRSA infection 07/09/2021   Abscess 07/09/2021   Cellulitis of finger of left hand 60/04/9322   Folliculitis 55/73/2202   Headache disorder 03/08/2021   Dysuria 03/05/2021   Pressure sore 01/29/2021   Hyperlipidemia associated with type 2 diabetes mellitus ([place]) 01/13/2021   Fatigue 12/01/2020   Acute on chronic diastolic heart failure ([place]) 11/16/2020   Chronic migraine without aura without status migrainosus, not intractable 10/01/2020   Dizziness 10/01/2020   Microscopic hematuria 07/01/2020   Pulmonary nodules 07/01/2020   Hav (hallux abducto valgus), unspecified laterality 06/11/2020   Venous ulcer of ankle, left ([place]) 05/15/2020   Contracture of hand joint 05/15/2020   Acute kidney injury superimposed on CKD ([place])    Hyperlipidemia    Carcinoma of lower-outer quadrant of right breast in female, estrogen receptor positive ([place]) 11/12/2019   Restrictive lung disease 10/08/2019   Postmenopausal bleeding 08/30/2019   Carotid stenosis, asymptomatic, right  08/16/2019   Nausea and vomiting 07/15/2019   Cellulitis of left lower extremity 06/04/2019   Multiple wounds of skin 06/04/2019   Other injury of unspecified body region, initial encounter 06/04/2019   (HFpEF) heart failure with preserved ejection fraction ([place]) 11/16/2018   Left-sided weakness 09/13/2018   Weakness of left lower extremity 09/05/2018   Recurrent falls 09/05/2018   PAD (peripheral artery disease) ([place]) 08/27/2018   Chronic congestive heart failure ([place]) 08/02/2018   Vaginal itching 06/15/2018   Chronic upper extremity pain (Secondary Area of Pain) (Bilateral) 04/23/2018   Diabetic polyneuropathy associated with type 2 diabetes mellitus ([place]) 04/23/2018   Long term prescription benzodiazepine use 04/23/2018   Neurogenic pain 04/23/2018   Vitamin D insufficiency 01/29/2018   Insulin dependent type 2 diabetes mellitus ([place]) 01/23/2018   DNR (do not resuscitate) 01/23/2018   CKD stage 3 due to type 2 diabetes mellitus ([place]) 01/23/2018   IBS (irritable bowel  syndrome) 01/19/2018   Chronic lower extremity pain (Primary Area of Pain) (Bilateral) 01/08/2018   Chronic low back pain [facility] Area of Pain) (Bilateral) w/ sciatica (Bilateral) 01/08/2018   Chronic pain syndrome 01/08/2018   Pharmacologic therapy 01/08/2018   Disorder of skeletal system 01/08/2018   Problems influencing health status 01/08/2018   Long term current use of opiate analgesic 01/08/2018   Restless leg syndrome 08/02/2017   Nocturnal hypoxemia 03/29/2017   Vision disturbance 02/28/2017   CKD (chronic kidney disease), stage II 02/28/2017   Visual loss, bilateral    Chronic respiratory failure with hypoxia (HCC)/ nocturnal 02 dep  10/28/2016   Upper airway cough syndrome 08/22/2016   Cigarette smoker 06/15/2016   Simple chronic bronchitis (HCC)    Coronary artery disease involving native heart without angina pectoris 05/16/2016   Chronic diastolic CHF (congestive heart failure) ([place]) 11/04/2015    Orthopnea 11/03/2015   Bilateral leg edema    Diabetes (HCC)    COPD exacerbation ([place]) 10/15/2015   Fracture of rib, closed 10/15/2015   Venous stasis of both lower extremities 03/29/2015   Preventative health care 02/04/2015   Hypertension associated with diabetes ([place]) 01/02/2015   Inguinal hernia 11/27/2014   Allergic rhinitis with postnasal drip 01/21/2014   Aortic valve disorders 04/18/2013   Sleep apnea 05/22/2012   Syncope and collapse 10/09/2011   Seizure disorder ([place]) 10/09/2011   Bipolar disorder ([place]) 10/09/2011   TIA (transient ischemic attack) 06/22/2011   Constipation 06/15/2011   HYPERTRIGLYCERIDEMIA 07/17/2007   Situational anxiety 05/30/2007   COPD with chronic bronchitis ([place]) 05/30/2007   Morbid (severe) obesity due to excess calories ([place]) 04/23/2007   Tobacco abuse 09/07/2006    Conditions to be addressed/monitored:CHF, HTN, and    Care Plan : [facility] plan of care  Updates made by Dannielle Karvonen, RN since 09/01/2021 12:00 AM     Problem: chronic disease management education and care coordination needs.   Priority: High     Long-Range Goal: Development of plan of care to address chronic disease management and care coordination needs.   Start Date: 08/30/2021  Expected End Date: 11/05/2021  Priority: High  Note:   . Goals closed.  [facility] is being [facility]  Current Barriers:  Knowledge Deficits related to plan of care for management of CHF, HTN, and    Patient reports having surgery on 07/21/21 due to abdominal wall abscess/ cellulitis. She reports her staples have been removed and she is healing well.  She reports next follow up visit with surgeon is 08/31/21.  Patient states she continues to be followed by [facility]. She states her vital sign readings she monitors, BP, Pulse, weight, blood sugar are electronically sent to [facility] for monitoring. Patient reports next follow up visit with her  oncologist is 09/10/21 and next psychiatry follow up is 09/15/21. Per chart review, next follow up with primary care provider is 12/02/21.   RNCM called [facility] and spoke with Tammy. Tammy states patient is followed by [facility] for diabetic teaching, vital sign monitoring She states currently patients vital signs are not being monitored remotely due to insurance but are being monitored on biweekly basis at nursing visits.  Tammy states patient also has access to on call nurse.   RNCM Clinical Goal(s):  Patient will verbalize understanding of plan for management of CHF, HTN, and   as evidenced by patient self report and / or notation in chart take all medications exactly  as prescribed and will call provider for medication related questions as evidenced by patient self report and/ or notation in chart    attend all scheduled medical appointments:   as evidenced by patient self report and/ or notation in chart.         continue to work with Consulting civil engineer and/or Social Worker to address care management and care coordination needs related to CHF, HTN, and   as evidenced by adherence to CM Team Scheduled appointments     through collaboration with Consulting civil engineer, provider, and care team.   Interventions: 1:1 collaboration with primary care provider regarding development and update of comprehensive plan of care as evidenced by provider attestation and co-signature Inter-disciplinary care team collaboration (see longitudinal plan of care) Evaluation of current treatment plan related to  self management and patient's adherence to plan as established by provider   Heart Failure Interventions:  (Status:  Goal resolved. ) Long Term Goal Advised to follow low salt diet Reviewed Heart Failure Action Plan in depth and provided written copy Discussed importance of daily weight Reviewed role of diuretics in prevention of fluid overload Discussed smoking cessation.    Hypertension Interventions:   (Status:  Goal resolved) Long Term Goal Last practice recorded BP readings:  BP Readings from Last 3 Encounters:  07/21/21 (!) 108/55  07/13/21 112/68  07/09/21 98/60  Most recent eGFR/CrCl: No results found for: EGFR  No components found for: CRCL  Reviewed medications with patient and discussed importance of compliance Discussed plans with patient for ongoing care management follow up and provided patient with direct contact information for care management team Reviewed scheduled/upcoming provider appointments I Advised to continue monitoring blood pressure daily     Patient Goals/Self-Care Activities: - Continue to take Heart Failure Medications / all medications as prescribed and refill timely.  - Continue to weigh daily and record (notify MD with 2 lb weight gain over night or 5 lb in a week).  Contact [facility] as recommended.  - Follow heart failure Action Plan - Continue to adhere to low sodium diet - keep legs up while sitting - watch for swelling in feet, ankles and legs every day - Consider smoking cessation      Plan:  Patient closed to embedded case management. Patient followed by [facility].  Embedded case management will be available to patient and / or [facility] if need arises.  Quinn Plowman RN,BSN,CCM RN Case Manager [place]  [phone]

## 2021-08-30 NOTE — Telephone Encounter (Signed)
Pt was a NS so ill resch in a few.

## 2021-08-30 NOTE — Telephone Encounter (Signed)
Patient would like her labs,MD and treatment all scheduled on the same day.

## 2021-08-31 DIAGNOSIS — E8589 Other amyloidosis: Secondary | ICD-10-CM | POA: Diagnosis not present

## 2021-08-31 DIAGNOSIS — L02811 Cutaneous abscess of head [any part, except face]: Secondary | ICD-10-CM | POA: Diagnosis not present

## 2021-09-01 NOTE — Patient Instructions (Signed)
Visit Information  Thank you for taking time to visit with me today. Please don't hesitate to contact me if I can be of assistance to you before our next scheduled telephone appointment.  Following are the goals we discussed today:  - Continue to take Heart Failure Medications / all medications as prescribed and refill timely.  - Continue to weigh daily and record (notify MD with 2 lb weight gain over night or 5 lb in a week).  Contact [facility] as recommended.  - Follow heart failure Action Plan - Continue to adhere to low sodium diet - keep legs up while sitting - watch for swelling in feet, ankles and legs every day - Consider smoking cessation   Patient closed to embedded case management. Patient followed by [facility].  Embedded case management will be available to patient and / or [facility] if need arises.   Please call the care guide team at [phone] if you need to cancel or reschedule your appointment.   If you are experiencing a Mental Health or [place] or need someone to talk to, please call the Suicide and Crisis Lifeline: 988 call 1-800-273-TALK (toll free, 24 hour hotline)   Patient verbalizes understanding of instructions and care plan provided today and agrees to view in [place]. Active MyChart status confirmed with patient.    Quinn Plowman RN,BSN,CCM RN Case Manager [place]  [phone]

## 2021-09-02 ENCOUNTER — Telehealth: Payer: Self-pay | Admitting: Primary Care

## 2021-09-02 NOTE — Telephone Encounter (Signed)
Noted. She should call the wound center for recommendations or be seen by someone in our office

## 2021-09-02 NOTE — Telephone Encounter (Signed)
[name] called from [facility] and she stated that she had a wound that pop up on her foot and on her heel 2 weeks ago and she started taking clindamycin, and she has an appointment with wound care on 3/2, and wanted to make sure its okay for her to take the medication until she sees wound care next week. And she needs a refill on her zofran

## 2021-09-02 NOTE — Telephone Encounter (Signed)
Called patient let her know that without evaluation that we can not recommend that she continue or start any medication. She wanted the zofran for the [facility] with antibiotic. Advised that we can not call in with our evaluation. She will give [place] a call if any changes in symptoms before appointment with wound care next week.  She declined appointment with our office for sooner evaluation.

## 2021-09-03 ENCOUNTER — Ambulatory Visit: Payer: Medicare Other | Admitting: Adult Health

## 2021-09-07 DIAGNOSIS — I5032 Chronic diastolic (congestive) heart failure: Secondary | ICD-10-CM

## 2021-09-07 DIAGNOSIS — I110 Hypertensive heart disease with heart failure: Secondary | ICD-10-CM

## 2021-09-09 ENCOUNTER — Other Ambulatory Visit: Payer: Self-pay

## 2021-09-09 ENCOUNTER — Encounter: Payer: BC Managed Care – PPO | Attending: Physician Assistant | Admitting: Physician Assistant

## 2021-09-09 ENCOUNTER — Encounter: Payer: Self-pay | Admitting: Internal Medicine

## 2021-09-09 DIAGNOSIS — E1142 Type 2 diabetes mellitus with diabetic polyneuropathy: Secondary | ICD-10-CM | POA: Insufficient documentation

## 2021-09-09 DIAGNOSIS — I2510 Atherosclerotic heart disease of native coronary artery without angina pectoris: Secondary | ICD-10-CM | POA: Diagnosis not present

## 2021-09-09 DIAGNOSIS — F17200 Nicotine dependence, unspecified, uncomplicated: Secondary | ICD-10-CM | POA: Diagnosis not present

## 2021-09-09 DIAGNOSIS — J449 Chronic obstructive pulmonary disease, unspecified: Secondary | ICD-10-CM | POA: Diagnosis not present

## 2021-09-09 DIAGNOSIS — I5032 Chronic diastolic (congestive) heart failure: Secondary | ICD-10-CM | POA: Insufficient documentation

## 2021-09-09 DIAGNOSIS — I110 Hypertensive heart disease with heart failure: Secondary | ICD-10-CM | POA: Diagnosis not present

## 2021-09-09 DIAGNOSIS — L97812 Non-pressure chronic ulcer of other part of right lower leg with fat layer exposed: Secondary | ICD-10-CM | POA: Insufficient documentation

## 2021-09-09 DIAGNOSIS — E11622 Type 2 diabetes mellitus with other skin ulcer: Secondary | ICD-10-CM | POA: Diagnosis not present

## 2021-09-09 DIAGNOSIS — Z9011 Acquired absence of right breast and nipple: Secondary | ICD-10-CM | POA: Diagnosis not present

## 2021-09-09 DIAGNOSIS — E11319 Type 2 diabetes mellitus with unspecified diabetic retinopathy without macular edema: Secondary | ICD-10-CM | POA: Diagnosis not present

## 2021-09-09 DIAGNOSIS — E11621 Type 2 diabetes mellitus with foot ulcer: Secondary | ICD-10-CM | POA: Insufficient documentation

## 2021-09-09 DIAGNOSIS — Z86718 Personal history of other venous thrombosis and embolism: Secondary | ICD-10-CM | POA: Insufficient documentation

## 2021-09-09 DIAGNOSIS — G4733 Obstructive sleep apnea (adult) (pediatric): Secondary | ICD-10-CM | POA: Diagnosis not present

## 2021-09-09 DIAGNOSIS — L97522 Non-pressure chronic ulcer of other part of left foot with fat layer exposed: Secondary | ICD-10-CM | POA: Diagnosis not present

## 2021-09-09 DIAGNOSIS — L89623 Pressure ulcer of left heel, stage 3: Secondary | ICD-10-CM | POA: Insufficient documentation

## 2021-09-09 DIAGNOSIS — Z794 Long term (current) use of insulin: Secondary | ICD-10-CM | POA: Insufficient documentation

## 2021-09-10 ENCOUNTER — Telehealth: Payer: Self-pay

## 2021-09-10 ENCOUNTER — Inpatient Hospital Stay: Payer: BC Managed Care – PPO | Attending: Internal Medicine

## 2021-09-10 ENCOUNTER — Encounter: Payer: Self-pay | Admitting: Internal Medicine

## 2021-09-10 ENCOUNTER — Inpatient Hospital Stay: Payer: BC Managed Care – PPO

## 2021-09-10 ENCOUNTER — Inpatient Hospital Stay (HOSPITAL_BASED_OUTPATIENT_CLINIC_OR_DEPARTMENT_OTHER): Payer: BC Managed Care – PPO | Admitting: Internal Medicine

## 2021-09-10 DIAGNOSIS — N182 Chronic kidney disease, stage 2 (mild): Secondary | ICD-10-CM | POA: Insufficient documentation

## 2021-09-10 DIAGNOSIS — E1122 Type 2 diabetes mellitus with diabetic chronic kidney disease: Secondary | ICD-10-CM | POA: Insufficient documentation

## 2021-09-10 DIAGNOSIS — F17210 Nicotine dependence, cigarettes, uncomplicated: Secondary | ICD-10-CM | POA: Insufficient documentation

## 2021-09-10 DIAGNOSIS — L97812 Non-pressure chronic ulcer of other part of right lower leg with fat layer exposed: Secondary | ICD-10-CM | POA: Diagnosis not present

## 2021-09-10 DIAGNOSIS — Z79899 Other long term (current) drug therapy: Secondary | ICD-10-CM | POA: Insufficient documentation

## 2021-09-10 DIAGNOSIS — F419 Anxiety disorder, unspecified: Secondary | ICD-10-CM | POA: Diagnosis not present

## 2021-09-10 DIAGNOSIS — K219 Gastro-esophageal reflux disease without esophagitis: Secondary | ICD-10-CM | POA: Insufficient documentation

## 2021-09-10 DIAGNOSIS — Z79811 Long term (current) use of aromatase inhibitors: Secondary | ICD-10-CM | POA: Insufficient documentation

## 2021-09-10 DIAGNOSIS — Z794 Long term (current) use of insulin: Secondary | ICD-10-CM | POA: Diagnosis not present

## 2021-09-10 DIAGNOSIS — C50511 Malignant neoplasm of lower-outer quadrant of right female breast: Secondary | ICD-10-CM | POA: Insufficient documentation

## 2021-09-10 DIAGNOSIS — E1142 Type 2 diabetes mellitus with diabetic polyneuropathy: Secondary | ICD-10-CM | POA: Diagnosis not present

## 2021-09-10 DIAGNOSIS — F319 Bipolar disorder, unspecified: Secondary | ICD-10-CM | POA: Insufficient documentation

## 2021-09-10 DIAGNOSIS — Z7984 Long term (current) use of oral hypoglycemic drugs: Secondary | ICD-10-CM | POA: Insufficient documentation

## 2021-09-10 DIAGNOSIS — Z170 Estrogen receptor positive status [ER+]: Secondary | ICD-10-CM | POA: Insufficient documentation

## 2021-09-10 DIAGNOSIS — J440 Chronic obstructive pulmonary disease with acute lower respiratory infection: Secondary | ICD-10-CM | POA: Diagnosis not present

## 2021-09-10 DIAGNOSIS — I130 Hypertensive heart and chronic kidney disease with heart failure and stage 1 through stage 4 chronic kidney disease, or unspecified chronic kidney disease: Secondary | ICD-10-CM | POA: Insufficient documentation

## 2021-09-10 DIAGNOSIS — L89623 Pressure ulcer of left heel, stage 3: Secondary | ICD-10-CM | POA: Diagnosis not present

## 2021-09-10 LAB — CBC WITH DIFFERENTIAL/PLATELET
Abs Immature Granulocytes: 0.05 10*3/uL (ref 0.00–0.07)
Basophils Absolute: 0.1 10*3/uL (ref 0.0–0.1)
Basophils Relative: 1 %
Eosinophils Absolute: 0.2 10*3/uL (ref 0.0–0.5)
Eosinophils Relative: 2 %
HCT: 38.9 % (ref 36.0–46.0)
Hemoglobin: 12.2 g/dL (ref 12.0–15.0)
Immature Granulocytes: 1 %
Lymphocytes Relative: 10 %
Lymphs Abs: 0.9 10*3/uL (ref 0.7–4.0)
MCH: 27.9 pg (ref 26.0–34.0)
MCHC: 31.4 g/dL (ref 30.0–36.0)
MCV: 89 fL (ref 80.0–100.0)
Monocytes Absolute: 0.5 10*3/uL (ref 0.1–1.0)
Monocytes Relative: 6 %
Neutro Abs: 7.2 10*3/uL (ref 1.7–7.7)
Neutrophils Relative %: 80 %
Platelets: 202 10*3/uL (ref 150–400)
RBC: 4.37 MIL/uL (ref 3.87–5.11)
RDW: 15 % (ref 11.5–15.5)
WBC: 9 10*3/uL (ref 4.0–10.5)
nRBC: 0 % (ref 0.0–0.2)

## 2021-09-10 LAB — COMPREHENSIVE METABOLIC PANEL
ALT: 15 U/L (ref 0–44)
AST: 14 U/L — ABNORMAL LOW (ref 15–41)
Albumin: 4 g/dL (ref 3.5–5.0)
Alkaline Phosphatase: 129 U/L — ABNORMAL HIGH (ref 38–126)
Anion gap: 7 (ref 5–15)
BUN: 36 mg/dL — ABNORMAL HIGH (ref 6–20)
CO2: 26 mmol/L (ref 22–32)
Calcium: 8.9 mg/dL (ref 8.9–10.3)
Chloride: 98 mmol/L (ref 98–111)
Creatinine, Ser: 1.03 mg/dL — ABNORMAL HIGH (ref 0.44–1.00)
GFR, Estimated: >60 mL/min (ref >60)
Glucose, Bld: 353 mg/dL — ABNORMAL HIGH (ref 70–99)
Potassium: 4.2 mmol/L (ref 3.5–5.1)
Sodium: 131 mmol/L — ABNORMAL LOW (ref 135–145)
Total Bilirubin: 0.6 mg/dL (ref 0.3–1.2)
Total Protein: 9.3 g/dL — ABNORMAL HIGH (ref 6.5–8.1)

## 2021-09-10 MED ORDER — LEUPROLIDE ACETATE (3 MONTH) 11.25 MG IM KIT
11.2500 mg | PACK | Freq: Once | INTRAMUSCULAR | Status: AC
  Administered 2021-09-10: 11.25 mg via INTRAMUSCULAR
  Filled 2021-09-10: qty 11.25

## 2021-09-10 MED ORDER — ONDANSETRON HCL 8 MG PO TABS: ORAL_TABLET | ORAL | 1 refills | Status: DC

## 2021-09-10 NOTE — Progress Notes (Signed)
CHELSIA, SERRES ([id number]) Visit Report for 09/09/2021 Chief Complaint Document Details Patient Name: [name], [name] Date of Service: 09/09/2021 8:45 AM Medical Record Number: [id number] Patient Account Number: [id number] Date of Birth/Sex: Dec 26, 1970 (51 y.o. F) Treating RN: Carlene Coria Primary Care Provider: Alma Friendly Other Clinician: Referring Provider: Alma Friendly Treating Provider/Extender: Skipper Cliche in Treatment: 0 Information Obtained from: Patient Chief Complaint Bilateral LE Ulcers Electronic Signature(s) Signed: 09/09/2021 9:40:41 AM By: Worthy Keeler PA-C Entered By: Worthy Keeler on 09/09/2021 09:40:41 Miklas, [name] ([id number]) -------------------------------------------------------------------------------- Debridement Details Patient Name: [name] Date of Service: 09/09/2021 8:45 AM Medical Record Number: [id number] Patient Account Number: [id number] Date of Birth/Sex: 1970-11-26 (50 y.o. F) Treating RN: Carlene Coria Primary Care Provider: Alma Friendly Other Clinician: Referring Provider: Alma Friendly Treating Provider/Extender: Skipper Cliche in Treatment: 0 Debridement Performed for Wound #10 Left,Dorsal Foot Assessment: Performed By: Physician Tommie Sams., PA-C Debridement Type: Chemical/Enzymatic/Mechanical Agent Used: Saline and gauze Severity of Tissue Pre Debridement: Fat layer exposed Level of Consciousness (Pre- Awake and Alert procedure): Pre-procedure Verification/Time Out Yes - 09:46 Taken: Start Time: 09:46 Instrument: Other : saline and gauze Bleeding: None End Time: 09:48 Procedural Pain: 0 Post Procedural Pain: 0 Response to Treatment: Procedure was tolerated well Level of Consciousness (Post- Awake and Alert procedure): Post Debridement Measurements of Total Wound Length: (cm) 2 Width: (cm) 2 Depth: (cm) 0.1 Volume: (cm) 0.314 Character of Wound/Ulcer Post Debridement: Improved Severity of  Tissue Post Debridement: Fat layer exposed Post Procedure Diagnosis Same as Pre-procedure Electronic Signature(s) Signed: 09/09/2021 5:22:07 PM By: Worthy Keeler PA-C Signed: 09/10/2021 1:46:31 PM By: Carlene Coria RN Entered By: Carlene Coria on 09/09/2021 09:47:37 Mermelstein, [name] ([id number]) -------------------------------------------------------------------------------- Debridement Details Patient Name: [name] Date of Service: 09/09/2021 8:45 AM Medical Record Number: [id number] Patient Account Number: [id number] Date of Birth/Sex: 07-10-1971 (51 y.o. F) Treating RN: Carlene Coria Primary Care Provider: Alma Friendly Other Clinician: Referring Provider: Alma Friendly Treating Provider/Extender: Skipper Cliche in Treatment: 0 Debridement Performed for Wound #11 Left Calcaneus Assessment: Performed By: Physician Tommie Sams., PA-C Debridement Type: Chemical/Enzymatic/Mechanical Agent Used: Saline and gauze Level of Consciousness (Pre- Awake and Alert procedure): Pre-procedure Verification/Time Out Yes - 09:46 Taken: Start Time: 09:46 Instrument: Other : saline and gauze Bleeding: None End Time: 09:48 Procedural Pain: 0 Post Procedural Pain: 0 Response to Treatment: Procedure was tolerated well Level of Consciousness (Post- Awake and Alert procedure): Post Debridement Measurements of Total Wound Length: (cm) 3.5 Stage: Category/Stage III Width: (cm) 5 Depth: (cm) 0.1 Volume: (cm) 1.374 Character of Wound/Ulcer Post Debridement: Improved Post Procedure Diagnosis Same as Pre-procedure Electronic Signature(s) Signed: 09/09/2021 5:22:07 PM By: Worthy Keeler PA-C Signed: 09/10/2021 1:46:31 PM By: Carlene Coria RN Entered By: Carlene Coria on 09/09/2021 09:48:03 Ackroyd, [name] ([id number]) -------------------------------------------------------------------------------- Debridement Details Patient Name: [name] Date of Service: 09/09/2021 8:45 AM Medical  Record Number: [id number] Patient Account Number: [id number] Date of Birth/Sex: 16-Nov-1970 (51 y.o. F) Treating RN: Carlene Coria Primary Care Provider: Alma Friendly Other Clinician: Referring Provider: Alma Friendly Treating Provider/Extender: Skipper Cliche in Treatment: 0 Debridement Performed for Wound #12 Right,Lateral Lower Leg Assessment: Performed By: Physician Tommie Sams., PA-C Debridement Type: Chemical/Enzymatic/Mechanical Agent Used: Saline and gauze Severity of Tissue Pre Debridement: Fat layer exposed Level of Consciousness (Pre- Awake and Alert procedure): Pre-procedure Verification/Time Out Yes - 09:46 Taken: Start Time: 09:46 Instrument: Other : saline and gauze Bleeding: None End Time: 09:48 Procedural Pain: 0 Post Procedural Pain: 0 Response to  Treatment: Procedure was tolerated well Level of Consciousness (Post- Awake and Alert procedure): Post Debridement Measurements of Total Wound Length: (cm) 4.5 Width: (cm) 3 Depth: (cm) 0.1 Volume: (cm) 1.06 Character of Wound/Ulcer Post Debridement: Improved Severity of Tissue Post Debridement: Fat layer exposed Post Procedure Diagnosis Same as Pre-procedure Electronic Signature(s) Signed: 09/09/2021 5:22:07 PM By: Worthy Keeler PA-C Signed: 09/10/2021 1:46:31 PM By: Carlene Coria RN Entered By: Carlene Coria on 09/09/2021 09:48:35 Vandagriff, Denay ([id number]) -------------------------------------------------------------------------------- HPI Details Patient Name: [name] Date of Service: 09/09/2021 8:45 AM Medical Record Number: [id number] Patient Account Number: [id number] Date of Birth/Sex: Dec 08, 1970 (51 y.o. F) Treating RN: Carlene Coria Primary Care Provider: Alma Friendly Other Clinician: Referring Provider: Alma Friendly Treating Provider/Extender: Skipper Cliche in Treatment: 0 History of Present Illness HPI Description: ADMISSION 06/19/2019 Patient is a 51 year old woman  who is accompanied by her husband. She has severe diabetic peripheral neuropathy which is left her minimally ambulatory. She spends most of the time in a wheelchair. She also has a history of chronic lower extremity edema history of left leg DVT. She states that on 2 separate occasions roughly a month ago she fell with lacerations on the bilateral anterior tibial areas. She was seen by primary care on 06/04/2019 diagnosed with cellulitis of the left leg put on Bactrim DS. Also noted to have hand wounds with a question of referral to hand surgery. She was also sent to our clinic at the same time. The patient's husband is doing the dressings. He has been using silver alginate that was supplied by home health. He is done a really good job the area on the left leg is actually healed only a small superficial area on the right Somewhere in the in the last 2 weeks she gives a bizarre history of going to wash nail polish off the fingers of both hands. She apparently did not realize that her son had put Clorox in the vinegar jar that they used to clean the countertop. She ended up burning her hands. The patient has a wound on the PIP of the left third finger, PIP of the right second finger and the lateral part of the right third finger Past medical history includes COPD, obstructive sleep apnea on nocturnal O2, type 2 diabetes with peripheral neuropathy, retinopathy, chronic diastolic heart failure coronary artery disease, severe diabetic neuropathy type 2 diabetes apparently diagnosed at age 77 on insulin, COPD, continued tobacco abuse, left leg deep DVT, hypertension, TIAs, seizure disorders, asthma The patient is actually had arterial studies in February of this year. ABIs bilaterally were 1.02 and TBI's bilaterally at 0.6. Waveforms were triphasic and biphasic. She was not felt to have significant arterial disease. 12/23; patient readmitted the clinic last week. She has predominantly venous insufficiency  wounds on the right medial lower leg. She also had open areas on the fingers of both hands which happened in a very bizarre way. She has almost complete healing on the right medial calf. The area on the left third finger is healed. She still has 2 open wounds on the medial aspect of the PIP of the right second finger and the dorsal aspect of the right PIP of the third finger. Of these 2 wounds the PIP of the third finger wound is painful and deeper 12/30; the areas on her right medial calf are closed. She likely has some degree of chronic venous insufficiency and lymphedema. Also very xerotic skin which I have recommended a moisturizing. I did not bring up  the issue of compression stockings The areas we are still looking out are on the PIP of the right third finger and the medial part of the PIP of the right second finger. The second finger area is just about closed. The problem is over the PIP of the right third finger. This does not look infected and she is completing her antibiotics from last week. However this is a deep wound at the base of this there is either tendon or periosteum. She she has some healthy granulation but we are not seeing that close over the bottom part of the wound. We are probably going to need to immobilize the finger somewhat so that she does not continuously pull the tissue apart 07/17/2019; right medial calf remains closed and the right second finger is closed. The sole problem here is an area over the PIP of the third finger. These wounds were initially advertised as burn injuries that happened in a very bizarre way [please see my initial discussion on this]. I gave her doxycycline last week because of some erythema around the wound however she did not tolerate this very well because of nausea. She stopped this within the last day or 2. we have been using silver collagen 1/13; the only wound we are looking at is the right third finger over the PIP. We are using endoform.  X-ray ordered last time showed no underlying bone abnormalities 1/20; right third finger over the PIP. Still debris at the surface we have been using endoform. We are making some improvements in surface area and volume but still requiring debridement. 1/27; right third finger over the PIP. Still debris on the surface of the wound and maceration. We have been using endoform. This wound initially went to the periosteum and it certainly is off that but is certainly stalled over the last 2 or 3 weeks 2/10; right third finger over the PIP. We switch to Iodoflex last time but she says this caused extreme erythema and blistering and they changed back to endoform. This is a wound that originally went to the periosteum. This is improved since her admission but really it is stalled. She missed her appointment last week but she states she has been using endoform. She complains of increasing pain in the finger that is making it difficult for her to sleep at times 2/17; right third finger over the PIP. This is a difficult wound small punched out area. Culture I did last week showed methicillin sensitive staph aureus however she is allergic to penicillin [widespread rash as a child] she has not had cephalosporins. Multiple drug interactions with quinolones and I was concerned about trimethoprim sulfamethoxazole in somebody with ACE inhibitors and spironolactone. I prescribed doxycycline. This was 2 days ago. She comes in today without the antibiotic saying that it caused mental status changes fatigue and diarrhea the last time she was on this. 2/24; right third finger over the PIP. She is tolerating the doxycycline has another 3 days left. This is for the area on the PIP of the third finger on her right. She arrives in clinic today with a thick raised eschar over the PIP of the left third finger. I talked to her about this. The exact reason is unclear. She does smoke but denies it could be a burn injury. Her  husband thinks this may be trauma pushing her wheelchair but the depth of this makes this somewhat unusual. She does not appear to have any blood flow issues in either arm Readmission:  06/29/2020 upon evaluation today patient appears to be doing somewhat poorly in regard to her left lower extremity. She has a couple open areas here which appear to be venous in nature. One of them is a more recent trauma but nonetheless both are very superficial. She does have a lot of lymphedema/stasis type weeping from the wound opening which I think is preventing her from healing. She has been trying to work on this LASHAUNTA, SICARD ([id number]) on her own but unfortunately just is not getting the results she needs as far as getting the swelling under control to allow these wounds to heal. Currently the patient tells me as well that she has been undergoing treatment for breast cancer on the right. I think she has a left mastectomy as well. She also has COPD. Her husband is actually taking very good care of her and doing what he can at this point in time. Patient does have hospice coming out to see her as well. Readmission: 02/02/21 second married issues that she was having with cellulitis. She tells me currently that she was in the hospital for several days and subsequently it was during that time that she developed a wound on her left heel. Initially this was thought to just be a blister and she was told not to pop it. Subsequently upon getting home she notes that this started to worsen an opening drain. That's when she decided to contact the wound center as we've seen her previously and taking care of ulcerations for her. Upon evaluation today the patient actually showed signs of what appears to be a pressure injury stage III to the left heel. There does not appear to be any significant depth although there's just one spot that goes down to the subcutaneous layer were there some Slough noted there is a lot of  blistered skin and callous tissue that is going to need to be removed help this to heal properly. The patient voiced understanding. Otherwise her medical history per above really has not changed intricately at this point. The patient was started on Doxy cycling two days ago 02/08/2021 upon evaluation today patient's wound actually showing signs of excellent improvement. I do not see any evidence of infection which is great news and after the debridement this actually seems to have done great over the past week. I am extremely pleased with what I see today. 9/7; patient presents for follow-up. Its been over a month since patient was last seen. She has been using Xeroform to the wound daily. She denies signs of infection. She has no issues or complaints today. 04/08/2021 upon evaluation today patient appears to be doing well currently in regard to her wound. She has been making great progress and overall very pleased with how the Xeroform is doing. I think she is significantly smaller and overall headed in the appropriate direction. 04/29/2021 upon evaluation today patient actually appears to be doing excellent at this time. She has been tolerating the dressing changes without complication and overall I think that we are definitely headed in the right direction in fact this appears to be completely healed though she still needs to be cautious with her heel I do not believe that she is getting need to wear any specific dressings any longer. She is very pleased to hear that just an ABD pad with some roll gauze will suffice using a stretch net to secure in place. Readmission: 07/15/2020 upon evaluation today patient presents for reevaluation here in the clinic last time I  saw her was actually April 29, 2021. Today she comes in with a somewhat confusing story about what exactly is going on. She has an area over the abdominal region more towards the right it appears to be hematoma. It is starting to dissipate  based on what I see. With that being said my biggest concern here is that she does not really know how or why this started. That is definitely of concern in general I did review the notes from a bowel her primary care in regard to what we are seeing here as well. The most recent provider she saw was Penelope Galas who is a Designer, jewellery previously she has seen Alinda Dooms who is also a Designer, jewellery. That is her normal primary care provider. Subsequently there was mention that she was supposed to be getting IV antibiotics by the patient to me today. With that being said that does not appear to be found in the notes nor something that was part of the plan. I did actually end up calling the office in order to discuss what exactly was going on and try to determine where things stand and what needed to be done. This is primarily for an issue on her scalp which was stated to be an abscess area. However when talking with the patient's nurse practitioner today which was Alinda Dooms it was noted that both times this was opened with incision and drainage that there was very little expressed from it again today I do not really see any significant fluctuance. The patient does currently have breast cancer for which she is being treated she has a strong history of cancer personally as well in general. Nonetheless my biggest concern that I see today is that I am not certain if she needs to see general surgery or if potentially she needs to even have a biopsy of this area to see if there is indeed any issue here with cancer. Either way this is not really a wound nor a wound care situation and I discussed that with the patient as well as with the patient's nurse practitioner as well. As far as the hematoma is concerned again it seems to be resolving but definitely something needs to be kept a close eye on as far as that is concerned. With regard to the fact that she has had difficulty swallowing and  issues with "sores in her mouth I am get a look at that as well since she is here today. Readmission: 09/09/2021 upon evaluation today patient presents for reevaluation here in the clinic she is having issues currently with several areas on her lower extremities bilaterally. She has a right leg ulceration which is actually a result of her having a fall in the rocks. This led to this abrasion over the region. Secondly she also has an area on her left Heel which appears to be a definitive pressure injury. Although she really cannot say exactly how this occurred. With that being said she also has an area of what appears to be likely pressure injury from a wrap on the dorsal surface of her foot. Her ABIs were 0.88 bilaterally which is good with that being said I think that being that she is diabetic, and the fact that there does seem to be some injuries on this left leg that are not very consistent with the patient that would have good blood flow that we may want to check into confirming that her blood flow is sufficient here. This would mean  a formal arterial study with ABI and TBI. Electronic Signature(s) Signed: 09/09/2021 10:33:55 AM By: Worthy Keeler PA-C Entered By: Worthy Keeler on 09/09/2021 10:33:55 Justen, [name] ([id number]) -------------------------------------------------------------------------------- Physical Exam Details Patient Name: [name] Date of Service: 09/09/2021 8:45 AM Medical Record Number: [id number] Patient Account Number: [id number] Date of Birth/Sex: [birth year] (50 y.o. F) Treating RN: Carlene Coria Primary Care Provider: Alma Friendly Other Clinician: Referring Provider: Alma Friendly Treating Provider/Extender: Skipper Cliche in Treatment: 0 Constitutional sitting or standing blood pressure is within target range for patient.. pulse regular and within target range for patient.Marland Kitchen respirations regular, non- labored and within target range for patient.Marland Kitchen  temperature within target range for patient.. Well-nourished and well-hydrated in no acute distress. Eyes conjunctiva clear no eyelid edema noted. pupils equal round and reactive to light and accommodation. Ears, Nose, Mouth, and Throat no gross abnormality of ear auricles or external auditory canals. normal hearing noted during conversation. mucus membranes moist. Respiratory normal breathing without difficulty. Cardiovascular 1+ dorsalis pedis/posterior tibialis pulses. 1+ pitting edema of the bilateral lower extremities. Musculoskeletal normal gait and posture. no significant deformity or arthritic changes, no loss or range of motion, no clubbing. Psychiatric this patient is able to make decisions and demonstrates good insight into disease process. Alert and Oriented x 3. pleasant and cooperative. Notes Upon evaluation patient appears to be doing a little bit worse in regard to her legs. She has some dry eschar and scabbing noted. She also is showing signs of needing something to try to loosen this up I think that Santyl would probably be the ideal thing to do here. I Georgina Peer go and send that into the pharmacy for her. Once we get it looser then we will definitely try to do what we can do as far as improving her quality of the tissue currently. Electronic Signature(s) Signed: 09/09/2021 10:34:46 AM By: Worthy Keeler PA-C Entered By: Worthy Keeler on 09/09/2021 10:34:46 Norris, [name] ([id number]) -------------------------------------------------------------------------------- Physician Orders Details Patient Name: [name] Date of Service: 09/09/2021 8:45 AM Medical Record Number: [id number] Patient Account Number: [id number] Date of Birth/Sex: [birth year] (50 y.o. F) Treating RN: Carlene Coria Primary Care Provider: Alma Friendly Other Clinician: Referring Provider: Alma Friendly Treating Provider/Extender: Skipper Cliche in Treatment: 0 Verbal / Phone Orders:  No Diagnosis Coding ICD-10 Coding Code Description E11.621 Type 2 diabetes mellitus with foot ulcer L97.522 Non-pressure chronic ulcer of other part of left foot with fat layer exposed L89.623 Pressure ulcer of left heel, stage 3 L97.812 Non-pressure chronic ulcer of other part of right lower leg with fat layer exposed Follow-up Appointments o Return Appointment in 1 week. Bathing/ Shower/ Hygiene o May shower; gently cleanse wound with antibacterial soap, rinse and pat dry prior to dressing wounds Edema Control - Lymphedema / Segmental Compressive Device / Other o Elevate, Exercise Daily and Avoid Standing for Long Periods of Time. o Elevate legs to the level of the heart and pump ankles as often as possible o Elevate leg(s) parallel to the floor when sitting. Off-Loading o Open toe surgical shoe Wound Treatment Wound #10 - Foot Wound Laterality: Dorsal, Left Cleanser: Byram Ancillary Kit - 15 Day Supply (DME) (Generic) 1 x Per Day/30 Days Discharge Instructions: Use supplies as instructed; Kit contains: (15) Saline Bullets; (15) 3x3 Gauze; 15 pr Gloves Primary Dressing: Gauze (DME) (Generic) 1 x Per Day/30 Days Discharge Instructions: As directed: dry, moistened with saline or moistened with Dakins Solution Primary Dressing: Santyl Collagenase Ointment, 30 (gm), tube  1 x Per Day/30 Days Secondary Dressing: Kerlix 4.5 x 4.1 (in/yd) (DME) (Generic) 1 x Per Day/30 Days Discharge Instructions: Apply Kerlix 4.5 x 4.1 (in/yd) as instructed Secured With: 31M Medipore H Soft Cloth Surgical Tape, 2x2 (in/yd) (DME) (Generic) 1 x Per Day/30 Days Wound #11 - Calcaneus Wound Laterality: Left Cleanser: Byram Ancillary Kit - 15 Day Supply (DME) (Generic) 1 x Per Day/30 Days Discharge Instructions: Use supplies as instructed; Kit contains: (15) Saline Bullets; (15) 3x3 Gauze; 15 pr Gloves Primary Dressing: Gauze 1 x Per Day/30 Days Discharge Instructions: As directed: dry, moistened with  saline or moistened with Dakins Solution Primary Dressing: Santyl Collagenase Ointment, 30 (gm), tube 1 x Per Day/30 Days Primary Dressing: ABD Pad, 5x9 (in/in) (DME) (Generic) 1 x Per Day/30 Days Secondary Dressing: Kerlix 4.5 x 4.1 (in/yd) (DME) (Generic) 1 x Per Day/30 Days Discharge Instructions: Apply Kerlix 4.5 x 4.1 (in/yd) as instructed Secured With: 31M Medipore H Soft Cloth Surgical Tape, 2x2 (in/yd) (DME) (Generic) 1 x Per Day/30 Days Janota, Onyx ([id number]) Wound #12 - Lower Leg Wound Laterality: Right, Lateral Cleanser: Byram Ancillary Kit - 15 Day Supply (DME) (Generic) 1 x Per Day/30 Days Discharge Instructions: Use supplies as instructed; Kit contains: (15) Saline Bullets; (15) 3x3 Gauze; 15 pr Gloves Primary Dressing: Gauze (DME) (Generic) 1 x Per Day/30 Days Discharge Instructions: As directed: dry, moistened with saline or moistened with Dakins Solution Primary Dressing: Santyl Collagenase Ointment, 30 (gm), tube 1 x Per Day/30 Days Secondary Dressing: Kerlix 4.5 x 4.1 (in/yd) (DME) (Generic) 1 x Per Day/30 Days Discharge Instructions: Apply Kerlix 4.5 x 4.1 (in/yd) as instructed Secured With: 31M Medipore H Soft Cloth Surgical Tape, 2x2 (in/yd) (DME) (Generic) 1 x Per Day/30 Days Consults o Vascular - [facility] ref for ABI and TBI's - (ICD10 E11.621 - Type 2 diabetes mellitus with foot ulcer) Patient Medications Allergies: Singulair, morphine, prednisone, penicillin, metoprolol, diltiazem Notifications Medication Indication Start End Santyl 09/09/2021 DOSE topical 250 unit/gram ointment - ointment topical Apply nickel thick daily to the wound bed and then cover with a dressing as directed in clinic x 30 days Electronic Signature(s) Signed: 09/09/2021 5:22:07 PM By: Worthy Keeler PA-C Signed: 09/10/2021 1:46:31 PM By: Carlene Coria RN Previous Signature: 09/09/2021 10:01:38 AM Version By: Worthy Keeler PA-C Entered By: Carlene Coria on 09/09/2021 10:07:31 Crepeau, [name]  ([id number]) -------------------------------------------------------------------------------- Problem List Details Patient Name: [name] Date of Service: 09/09/2021 8:45 AM Medical Record Number: [id number] Patient Account Number: [id number] Date of Birth/Sex: Dec 19, 1970 (51 y.o. 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 layer 09/09/2021 No Yes exposed L89.623 Pressure ulcer of left heel, stage 3 09/09/2021 No Yes L97.812 Non-pressure chronic ulcer of other part of right lower leg with fat layer 09/09/2021 No Yes exposed Inactive Problems Resolved Problems Electronic Signature(s) Signed: 09/09/2021 9:39:21 AM By: Worthy Keeler PA-C Entered By: Worthy Keeler on 09/09/2021 09:39:21 Hirsch, Yuliya ([id number]) -------------------------------------------------------------------------------- Progress Note Details Patient Name: [name] Date of Service: 09/09/2021 8:45 AM Medical Record Number: [id number] Patient Account Number: [id number] Date of Birth/Sex: [birth year] (50 y.o. F) Treating RN: Carlene Coria Primary Care Provider: Alma Friendly Other Clinician: Referring Provider: Alma Friendly Treating Provider/Extender: Skipper Cliche in  Treatment: 0 Subjective Chief Complaint Information obtained from Patient Bilateral LE Ulcers History of Present Illness (HPI) ADMISSION 06/19/2019 Patient is a 51 year old woman who is accompanied by her husband. She has severe diabetic peripheral neuropathy which is left her minimally ambulatory. She spends most of the time in a wheelchair. She also has a history of chronic lower  extremity edema history of left leg DVT. She states that on 2 separate occasions roughly a month ago she fell with lacerations on the bilateral anterior tibial areas. She was seen by primary care on 06/04/2019 diagnosed with cellulitis of the left leg put on Bactrim DS. Also noted to have hand wounds with a question of referral to hand surgery. She was also sent to our clinic at the same time. The patient's husband is doing the dressings. He has been using silver alginate that was supplied by home health. He is done a really good job the area on the left leg is actually healed only a small superficial area on the right Somewhere in the in the last 2 weeks she gives a bizarre history of going to wash nail polish off the fingers of both hands. She apparently did not realize that her son had put Clorox in the vinegar jar that they used to clean the countertop. She ended up burning her hands. The patient has a wound on the PIP of the left third finger, PIP of the right second finger and the lateral part of the right third finger Past medical history includes COPD, obstructive sleep apnea on nocturnal O2, type 2 diabetes with peripheral neuropathy, retinopathy, chronic diastolic heart failure coronary artery disease, severe diabetic neuropathy type 2 diabetes apparently diagnosed at age 76 on insulin, COPD, continued tobacco abuse, left leg deep DVT, hypertension, TIAs, seizure disorders, asthma The patient is actually had arterial studies in February of this year. ABIs bilaterally were 1.02 and TBI's bilaterally at 0.6. Waveforms were triphasic and biphasic. She was not felt to have significant arterial disease. 12/23; patient readmitted the clinic last week. She has predominantly venous insufficiency wounds on the right medial lower leg. She also had open areas on the fingers of both hands which happened in a very bizarre way. She has almost complete healing on the right medial calf. The area on the left  third finger is healed. She still has 2 open wounds on the medial aspect of the PIP of the right second finger and the dorsal aspect of the right PIP of the third finger. Of these 2 wounds the PIP of the third finger wound is painful and deeper 12/30; the areas on her right medial calf are closed. She likely has some degree of chronic venous insufficiency and lymphedema. Also very xerotic skin which I have recommended a moisturizing. I did not bring up the issue of compression stockings The areas we are still looking out are on the PIP of the right third finger and the medial part of the PIP of the right second finger. The second finger area is just about closed. The problem is over the PIP of the right third finger. This does not look infected and she is completing her antibiotics from last week. However this is a deep wound at the base of this there is either tendon or periosteum. She she has some healthy granulation but we are not seeing that close over the bottom part of the wound. We are probably going to need to immobilize the finger somewhat so that she does not continuously  pull the tissue apart 07/17/2019; right medial calf remains closed and the right second finger is closed. The sole problem here is an area over the PIP of the third finger. These wounds were initially advertised as burn injuries that happened in a very bizarre way [please see my initial discussion on this]. I gave her doxycycline last week because of some erythema around the wound however she did not tolerate this very well because of nausea. She stopped this within the last day or 2. we have been using silver collagen 1/13; the only wound we are looking at is the right third finger over the PIP. We are using endoform. X-ray ordered last time showed no underlying bone abnormalities 1/20; right third finger over the PIP. Still debris at the surface we have been using endoform. We are making some improvements in surface  area and volume but still requiring debridement. 1/27; right third finger over the PIP. Still debris on the surface of the wound and maceration. We have been using endoform. This wound initially went to the periosteum and it certainly is off that but is certainly stalled over the last 2 or 3 weeks 2/10; right third finger over the PIP. We switch to Iodoflex last time but she says this caused extreme erythema and blistering and they changed back to endoform. This is a wound that originally went to the periosteum. This is improved since her admission but really it is stalled. She missed her appointment last week but she states she has been using endoform. She complains of increasing pain in the finger that is making it difficult for her to sleep at times 2/17; right third finger over the PIP. This is a difficult wound small punched out area. Culture I did last week showed methicillin sensitive staph aureus however she is allergic to penicillin [widespread rash as a child] she has not had cephalosporins. Multiple drug interactions with quinolones and I was concerned about trimethoprim sulfamethoxazole in somebody with ACE inhibitors and spironolactone. I prescribed doxycycline. This was 2 days ago. She comes in today without the antibiotic saying that it caused mental status changes fatigue and diarrhea the last time she was on this. 2/24; right third finger over the PIP. She is tolerating the doxycycline has another 3 days left. This is for the area on the PIP of the third finger on her right. She arrives in clinic today with a thick raised eschar over the PIP of the left third finger. I talked to her about this. The exact reason is unclear. She does smoke but denies it could be a burn injury. Her husband thinks this may be trauma pushing her wheelchair but the depth of this makes this somewhat unusual. She does not appear to have any blood flow issues in either arm Zurcher, Ivanka  ([id number]) Readmission: 06/29/2020 upon evaluation today patient appears to be doing somewhat poorly in regard to her left lower extremity. She has a couple open areas here which appear to be venous in nature. One of them is a more recent trauma but nonetheless both are very superficial. She does have a lot of lymphedema/stasis type weeping from the wound opening which I think is preventing her from healing. She has been trying to work on this on her own but unfortunately just is not getting the results she needs as far as getting the swelling under control to allow these wounds to heal. Currently the patient tells me as well that she has been undergoing treatment for breast  cancer on the right. I think she has a left mastectomy as well. She also has COPD. Her husband is actually taking very good care of her and doing what he can at this point in time. Patient does have hospice coming out to see her as well. Readmission: 02/02/21 second married issues that she was having with cellulitis. She tells me currently that she was in the hospital for several days and subsequently it was during that time that she developed a wound on her left heel. Initially this was thought to just be a blister and she was told not to pop it. Subsequently upon getting home she notes that this started to worsen an opening drain. That's when she decided to contact the wound center as we've seen her previously and taking care of ulcerations for her. Upon evaluation today the patient actually showed signs of what appears to be a pressure injury stage III to the left heel. There does not appear to be any significant depth although there's just one spot that goes down to the subcutaneous layer were there some Slough noted there is a lot of blistered skin and callous tissue that is going to need to be removed help this to heal properly. The patient voiced understanding. Otherwise her medical history per above really has not changed  intricately at this point. The patient was started on Doxy cycling two days ago 02/08/2021 upon evaluation today patient's wound actually showing signs of excellent improvement. I do not see any evidence of infection which is great news and after the debridement this actually seems to have done great over the past week. I am extremely pleased with what I see today. 9/7; patient presents for follow-up. Its been over a month since patient was last seen. She has been using Xeroform to the wound daily. She denies signs of infection. She has no issues or complaints today. 04/08/2021 upon evaluation today patient appears to be doing well currently in regard to her wound. She has been making great progress and overall very pleased with how the Xeroform is doing. I think she is significantly smaller and overall headed in the appropriate direction. 04/29/2021 upon evaluation today patient actually appears to be doing excellent at this time. She has been tolerating the dressing changes without complication and overall I think that we are definitely headed in the right direction in fact this appears to be completely healed though she still needs to be cautious with her heel I do not believe that she is getting need to wear any specific dressings any longer. She is very pleased to hear that just an ABD pad with some roll gauze will suffice using a stretch net to secure in place. Readmission: 07/15/2020 upon evaluation today patient presents for reevaluation here in the clinic last time I saw her was actually April 29, 2021. Today she comes in with a somewhat confusing story about what exactly is going on. She has an area over the abdominal region more towards the right it appears to be hematoma. It is starting to dissipate based on what I see. With that being said my biggest concern here is that she does not really know how or why this started. That is definitely of concern in general I did review the notes from a  bowel her primary care in regard to what we are seeing here as well. The most recent provider she saw was Penelope Galas who is a Designer, jewellery previously she has seen Alinda Dooms who is  also a Designer, jewellery. That is her normal primary care provider. Subsequently there was mention that she was supposed to be getting IV antibiotics by the patient to me today. With that being said that does not appear to be found in the notes nor something that was part of the plan. I did actually end up calling the office in order to discuss what exactly was going on and try to determine where things stand and what needed to be done. This is primarily for an issue on her scalp which was stated to be an abscess area. However when talking with the patient's nurse practitioner today which was Alinda Dooms it was noted that both times this was opened with incision and drainage that there was very little expressed from it again today I do not really see any significant fluctuance. The patient does currently have breast cancer for which she is being treated she has a strong history of cancer personally as well in general. Nonetheless my biggest concern that I see today is that I am not certain if she needs to see general surgery or if potentially she needs to even have a biopsy of this area to see if there is indeed any issue here with cancer. Either way this is not really a wound nor a wound care situation and I discussed that with the patient as well as with the patient's nurse practitioner as well. As far as the hematoma is concerned again it seems to be resolving but definitely something needs to be kept a close eye on as far as that is concerned. With regard to the fact that she has had difficulty swallowing and issues with "sores in her mouth I am get a look at that as well since she is here today. Readmission: 09/09/2021 upon evaluation today patient presents for reevaluation here in the clinic she is  having issues currently with several areas on her lower extremities bilaterally. She has a right leg ulceration which is actually a result of her having a fall in the rocks. This led to this abrasion over the region. Secondly she also has an area on her left Heel which appears to be a definitive pressure injury. Although she really cannot say exactly how this occurred. With that being said she also has an area of what appears to be likely pressure injury from a wrap on the dorsal surface of her foot. Her ABIs were 0.88 bilaterally which is good with that being said I think that being that she is diabetic, and the fact that there does seem to be some injuries on this left leg that are not very consistent with the patient that would have good blood flow that we may want to check into confirming that her blood flow is sufficient here. This would mean a formal arterial study with ABI and TBI. Patient History Information obtained from Patient. Allergies penicillin (Severity: Moderate, Reaction: rash), Singulair (Severity: Severe, Reaction: tongue swelling), metoprolol, morphine (Severity: Severe, Reaction: face swelling, unable to swallow), prednisone (Severity: Severe, Reaction: throat swelling), diltiazem Family History Cancer - Siblings, Diabetes - Siblings,Father,Mother, Heart Disease - Siblings,Father,Mother, Hypertension - Mother,Father,Siblings, Lung Disease - Mother, Seizures - Mother, Stroke - Mother,Siblings,Father, No family history of Hereditary Spherocytosis, Kidney Disease, Thyroid Problems, Tuberculosis. Social History TEA, COLLUMS ([id number]) Current every day smoker - 40 years, Marital Status - Married, Alcohol Use - Never, Drug Use - No History, Caffeine Use - Daily - coffee, soda. Medical History Eyes Denies history of Cataracts, Glaucoma, Optic  Neuritis Ear/Nose/Mouth/Throat Denies history of Chronic sinus problems/congestion, Middle ear problems Hematologic/Lymphatic Denies  history of Anemia, Hemophilia, Human Immunodeficiency Virus, Lymphedema, Sickle Cell Disease Respiratory Patient has history of Asthma, Chronic Obstructive Pulmonary Disease (COPD), Sleep Apnea Cardiovascular Patient has history of Arrhythmia - sinus tachycardia, Congestive Heart Failure, Coronary Artery Disease, Hypertension Gastrointestinal Denies history of Cirrhosis , Colitis, Crohn s, Hepatitis A, Hepatitis B, Hepatitis C Endocrine Patient has history of Type II Diabetes Genitourinary Denies history of End Stage Renal Disease Immunological Denies history of Lupus Erythematosus, Raynaud s, Scleroderma Integumentary (Skin) Denies history of History of Burn, History of pressure wounds Musculoskeletal Denies history of Gout, Rheumatoid Arthritis, Osteoarthritis, Osteomyelitis Neurologic Patient has history of Neuropathy - legs, feet and hands Denies history of Dementia, Quadriplegia, Paraplegia, Seizure Disorder Oncologic Patient has history of Received Chemotherapy, Received Radiation Psychiatric Denies history of Anorexia/bulimia, Confinement Anxiety Medical And Surgical History Notes Neurologic Stroke 2020 Left weakness Oncologic Breast cancer-right mastectomy Objective Constitutional sitting or standing blood pressure is within target range for patient.. pulse regular and within target range for patient.Marland Kitchen respirations regular, non- labored and within target range for patient.Marland Kitchen temperature within target range for patient.. Well-nourished and well-hydrated in no acute distress. Vitals Time Taken: 9:03 AM, Height: 64 in, Source: Stated, Weight: 219 lbs, Source: Stated, BMI: 37.6, Temperature: 98 F, Pulse: 91 bpm, Respiratory Rate: 18 breaths/min, Blood Pressure: 137/80 mmHg. Eyes conjunctiva clear no eyelid edema noted. pupils equal round and reactive to light and accommodation. Ears, Nose, Mouth, and Throat no gross abnormality of ear auricles or external auditory canals.  normal hearing noted during conversation. mucus membranes moist. Respiratory normal breathing without difficulty. Cardiovascular 1+ dorsalis pedis/posterior tibialis pulses. 1+ pitting edema of the bilateral lower extremities. Musculoskeletal normal gait and posture. no significant deformity or arthritic changes, no loss or range of motion, no clubbing. Psychiatric this patient is able to make decisions and demonstrates good insight into disease process. Alert and Oriented x 3. pleasant and cooperative. Volante, [name] ([id number]) General Notes: Upon evaluation patient appears to be doing a little bit worse in regard to her legs. She has some dry eschar and scabbing noted. She also is showing signs of needing something to try to loosen this up I think that Santyl would probably be the ideal thing to do here. I Georgina Peer go and send that into the pharmacy for her. Once we get it looser then we will definitely try to do what we can do as far as improving her quality of the tissue currently. Integumentary (Hair, Skin) Wound #10 status is Open. Original cause of wound was Gradually Appeared. The date acquired was: 08/11/2021. The wound is located on the Left,Dorsal Foot. The wound measures 2cm length x 2cm width x 0.1cm depth; 3.142cm^2 area and 0.314cm^3 volume. There is no tunneling or undermining noted. There is a none present amount of drainage noted. There is no granulation within the wound bed. There is a large (67-100%) amount of necrotic tissue within the wound bed including Eschar and Adherent Slough. Wound #11 status is Open. Original cause of wound was Gradually Appeared. The date acquired was: 08/11/2021. The wound is located on the Left Calcaneus. The wound measures 3.5cm length x 5cm width x 0.1cm depth; 13.744cm^2 area and 1.374cm^3 volume. There is Fat Layer (Subcutaneous Tissue) exposed. There is no tunneling noted, however, there is undermining starting at 12:00 and ending at 12:00 with  a maximum distance of 3cm. There is a medium amount of serosanguineous drainage noted. There  is medium (34-66%) red, pink granulation within the wound bed. There is a medium (34-66%) amount of necrotic tissue within the wound bed including Eschar and Adherent Slough. Wound #12 status is Open. Original cause of wound was Trauma. The date acquired was: 08/11/2021. The wound is located on the Right,Lateral Lower Leg. The wound measures 4.5cm length x 3cm width x 0.1cm depth; 10.603cm^2 area and 1.06cm^3 volume. There is Fat Layer (Subcutaneous Tissue) exposed. There is no tunneling or undermining noted. There is a medium amount of serosanguineous drainage noted. There is medium (34-66%) red, pink granulation within the wound bed. There is a medium (34-66%) amount of necrotic tissue within the wound bed including Adherent Slough. Assessment Active Problems ICD-10 Type 2 diabetes mellitus with foot ulcer Non-pressure chronic ulcer of other part of left foot with fat layer exposed Pressure ulcer of left heel, stage 3 Non-pressure chronic ulcer of other part of right lower leg with fat layer exposed Procedures Wound #10 Pre-procedure diagnosis of Wound #10 is a Diabetic Wound/Ulcer of the Lower Extremity located on the Left,Dorsal Foot .Severity of Tissue Pre Debridement is: Fat layer exposed. There was a Chemical/Enzymatic/Mechanical debridement performed by Tommie Sams., PA-C. With the following instrument(s): saline and gauze. Other agent used was Saline and gauze. A time out was conducted at 09:46, prior to the start of the procedure. There was no bleeding. The procedure was tolerated well with a pain level of 0 throughout and a pain level of 0 following the procedure. Post Debridement Measurements: 2cm length x 2cm width x 0.1cm depth; 0.314cm^3 volume. Character of Wound/Ulcer Post Debridement is improved. Severity of Tissue Post Debridement is: Fat layer exposed. Post procedure Diagnosis Wound  #10: Same as Pre-Procedure Wound #11 Pre-procedure diagnosis of Wound #11 is a Pressure Ulcer located on the Left Calcaneus . There was a Chemical/Enzymatic/Mechanical debridement performed by Tommie Sams., PA-C. With the following instrument(s): saline and gauze. Other agent used was Saline and gauze. A time out was conducted at 09:46, prior to the start of the procedure. There was no bleeding. The procedure was tolerated well with a pain level of 0 throughout and a pain level of 0 following the procedure. Post Debridement Measurements: 3.5cm length x 5cm width x 0.1cm depth; 1.374cm^3 volume. Post debridement Stage noted as Category/Stage III. Character of Wound/Ulcer Post Debridement is improved. Post procedure Diagnosis Wound #11: Same as Pre-Procedure Wound #12 Pre-procedure diagnosis of Wound #12 is a Diabetic Wound/Ulcer of the Lower Extremity located on the Right,Lateral Lower Leg .Severity of Tissue Pre Debridement is: Fat layer exposed. There was a Chemical/Enzymatic/Mechanical debridement performed by Tommie Sams., PA-C. With the following instrument(s): saline and gauze. Other agent used was Saline and gauze. A time out was conducted at 09:46, prior to the start of the procedure. There was no bleeding. The procedure was tolerated well with a pain level of 0 throughout and a pain level of 0 following the procedure. Post Debridement Measurements: 4.5cm length x 3cm width x 0.1cm depth; 1.06cm^3 volume. Character of Wound/Ulcer Post Debridement is improved. Severity of Tissue Post Debridement is: Fat layer exposed. Post procedure Diagnosis Wound #12: Same as Pre-Procedure LOUCILLE, TAKACH ([id number]) Plan Follow-up Appointments: Return Appointment in 1 week. Bathing/ Shower/ Hygiene: May shower; gently cleanse wound with antibacterial soap, rinse and pat dry prior to dressing wounds Edema Control - Lymphedema / Segmental Compressive Device / Other: Elevate, Exercise Daily and Avoid  Standing for Long Periods of Time. Elevate legs to the level  of the heart and pump ankles as often as possible Elevate leg(s) parallel to the floor when sitting. Off-Loading: Open toe surgical shoe Consults ordered were: Vascular - [facility] ref for ABI and TBI's The following medication(s) was prescribed: Santyl topical 250 unit/gram ointment ointment topical Apply nickel thick daily to the wound bed and then cover with a dressing as directed in clinic x 30 days starting 09/09/2021 WOUND #10: - Foot Wound Laterality: Dorsal, Left Cleanser: Byram Ancillary Kit - 15 Day Supply (DME) (Generic) 1 x Per Day/30 Days Discharge Instructions: Use supplies as instructed; Kit contains: (15) Saline Bullets; (15) 3x3 Gauze; 15 pr Gloves Primary Dressing: Gauze (DME) (Generic) 1 x Per Day/30 Days Discharge Instructions: As directed: dry, moistened with saline or moistened with Dakins Solution Primary Dressing: Santyl Collagenase Ointment, 30 (gm), tube 1 x Per Day/30 Days Secondary Dressing: Kerlix 4.5 x 4.1 (in/yd) (DME) (Generic) 1 x Per Day/30 Days Discharge Instructions: Apply Kerlix 4.5 x 4.1 (in/yd) as instructed Secured With: 59M Medipore H Soft Cloth Surgical Tape, 2x2 (in/yd) (DME) (Generic) 1 x Per Day/30 Days WOUND #11: - Calcaneus Wound Laterality: Left Cleanser: Byram Ancillary Kit - 15 Day Supply (DME) (Generic) 1 x Per Day/30 Days Discharge Instructions: Use supplies as instructed; Kit contains: (15) Saline Bullets; (15) 3x3 Gauze; 15 pr Gloves Primary Dressing: Gauze 1 x Per Day/30 Days Discharge Instructions: As directed: dry, moistened with saline or moistened with Dakins Solution Primary Dressing: Santyl Collagenase Ointment, 30 (gm), tube 1 x Per Day/30 Days Primary Dressing: ABD Pad, 5x9 (in/in) (DME) (Generic) 1 x Per Day/30 Days Secondary Dressing: Kerlix 4.5 x 4.1 (in/yd) (DME) (Generic) 1 x Per Day/30 Days Discharge Instructions: Apply Kerlix 4.5 x 4.1 (in/yd) as instructed Secured  With: 59M Medipore H Soft Cloth Surgical Tape, 2x2 (in/yd) (DME) (Generic) 1 x Per Day/30 Days WOUND #12: - Lower Leg Wound Laterality: Right, Lateral Cleanser: Byram Ancillary Kit - 15 Day Supply (DME) (Generic) 1 x Per Day/30 Days Discharge Instructions: Use supplies as instructed; Kit contains: (15) Saline Bullets; (15) 3x3 Gauze; 15 pr Gloves Primary Dressing: Gauze (DME) (Generic) 1 x Per Day/30 Days Discharge Instructions: As directed: dry, moistened with saline or moistened with Dakins Solution Primary Dressing: Santyl Collagenase Ointment, 30 (gm), tube 1 x Per Day/30 Days Secondary Dressing: Kerlix 4.5 x 4.1 (in/yd) (DME) (Generic) 1 x Per Day/30 Days Discharge Instructions: Apply Kerlix 4.5 x 4.1 (in/yd) as instructed Secured With: 59M Medipore H Soft Cloth Surgical Tape, 2x2 (in/yd) (DME) (Generic) 1 x Per Day/30 Days 1. Would recommend currently that we go ahead and initiate treatment with a Santyl dressing to all wounds. 2. We will cover this with a saline moistened gauze and then dry dressings to cover. 3. I would also recommend the patient continue to monitor for any signs of worsening or infection right now I see no evidence of infection but we will keep a close eye on this. We will see patient back for reevaluation in 1 week here in the clinic. If anything worsens or changes patient will contact our office for additional recommendations. Electronic Signature(s) Signed: 09/09/2021 10:35:22 AM By: Worthy Keeler PA-C Entered By: Worthy Keeler on 09/09/2021 10:35:22 Bova, [name] ([id number]) -------------------------------------------------------------------------------- ROS/PFSH Details Patient Name: [name] Date of Service: 09/09/2021 8:45 AM Medical Record Number: [id number] Patient Account Number: [id number] Date of Birth/Sex: 1971/07/04 (50 y.o. F) Treating RN: Carlene Coria Primary Care Provider: Alma Friendly Other Clinician: Referring Provider: Alma Friendly Treating Provider/Extender: Jeri Cos  Weeks in Treatment: 0 Information Obtained From Patient Eyes Medical History: Negative for: Cataracts; Glaucoma; Optic Neuritis Ear/Nose/Mouth/Throat Medical History: Negative for: Chronic sinus problems/congestion; Middle ear problems Hematologic/Lymphatic Medical History: Negative for: Anemia; Hemophilia; Human Immunodeficiency Virus; Lymphedema; Sickle Cell Disease Respiratory Medical History: Positive for: Asthma; Chronic Obstructive Pulmonary Disease (COPD); Sleep Apnea Cardiovascular Medical History: Positive for: Arrhythmia - sinus tachycardia; Congestive Heart Failure; Coronary Artery Disease; Hypertension Gastrointestinal Medical History: Negative for: Cirrhosis ; Colitis; Crohnos; Hepatitis A; Hepatitis B; Hepatitis C Endocrine Medical History: Positive for: Type II Diabetes Time with diabetes: 30 years Treated with: Insulin, Oral agents Blood sugar tested every day: Yes Tested : daily Blood sugar testing results: Breakfast: 402 Genitourinary Medical History: Negative for: End Stage Renal Disease Immunological Medical History: Negative for: Lupus Erythematosus; Raynaudos; Scleroderma Integumentary (Skin) Medical History: Negative for: History of Burn; History of pressure wounds Himmelberger, Devona ([id number]) Musculoskeletal Medical History: Negative for: Gout; Rheumatoid Arthritis; Osteoarthritis; Osteomyelitis Neurologic Medical History: Positive for: Neuropathy - legs, feet and hands Negative for: Dementia; Quadriplegia; Paraplegia; Seizure Disorder Past Medical History Notes: Stroke 2020 Left weakness Oncologic Medical History: Positive for: Received Chemotherapy; Received Radiation Past Medical History Notes: Breast cancer-right mastectomy Psychiatric Medical History: Negative for: Anorexia/bulimia; Confinement Anxiety Immunizations Pneumococcal Vaccine: Received Pneumococcal Vaccination:  Yes Received Pneumococcal Vaccination On or After 60th Birthday: No Implantable Devices None Family and Social History Cancer: Yes - Siblings; Diabetes: Yes - Siblings,Father,Mother; Heart Disease: Yes - Siblings,Father,Mother; Hereditary Spherocytosis: No; Hypertension: Yes - Mother,Father,Siblings; Kidney Disease: No; Lung Disease: Yes - Mother; Seizures: Yes - Mother; Stroke: Yes - Mother,Siblings,Father; Thyroid Problems: No; Tuberculosis: No; Current every day smoker - 40 years; Marital Status - Married; Alcohol Use: Never; Drug Use: No History; Caffeine Use: Daily - coffee, soda; Financial Concerns: No; Food, Clothing or Shelter Needs: No; Support System Lacking: No; Transportation Concerns: No Electronic Signature(s) Signed: 09/09/2021 5:22:07 PM By: Worthy Keeler PA-C Signed: 09/10/2021 1:46:31 PM By: Carlene Coria RN Entered By: Carlene Coria on 09/09/2021 09:05:42 Garringer, [name] ([id number]) -------------------------------------------------------------------------------- SuperBill Details Patient Name: [name] Date of Service: 09/09/2021 Medical Record Number: [id number] Patient Account Number: [id number] Date of Birth/Sex: [birth year] (51 y.o. F) Treating RN: Carlene Coria Primary Care Provider: Alma Friendly Other Clinician: Referring Provider: Alma Friendly Treating Provider/Extender: Skipper Cliche in Treatment: 0 Diagnosis Coding ICD-10 Codes Code Description E11.621 Type 2 diabetes mellitus with foot ulcer L97.522 Non-pressure chronic ulcer of other part of left foot with fat layer exposed L89.623 Pressure ulcer of left heel, stage 3 L97.812 Non-pressure chronic ulcer of other part of right lower leg with fat layer exposed Facility Procedures CPT4 Code: [id number] Description: [phone] - WOUND CARE VISIT-LEV 5 EST PT Modifier: Quantity: 1 Physician Procedures CPT4 Code: [id number] Description: 99214 - WC PHYS LEVEL 4 - EST PT Modifier: Quantity: 1 CPT4  Code: Description: ICD-10 Diagnosis Description E11.621 Type 2 diabetes mellitus with foot ulcer L97.522 Non-pressure chronic ulcer of other part of left foot with fat layer ex P53.748 Pressure ulcer of left heel, stage 3 L97.812 Non-pressure chronic ulcer of  other part of right lower leg with fat la Modifier: posed yer exposed Quantity: Electronic Signature(s) Signed: 09/09/2021 10:36:19 AM By: Worthy Keeler PA-C Previous Signature: 09/09/2021 10:01:23 AM Version By: Carlene Coria RN Entered By: Worthy Keeler on 09/09/2021 10:36:19

## 2021-09-10 NOTE — Telephone Encounter (Signed)
Pt asking for something be called in for nausea. ?

## 2021-09-10 NOTE — Progress Notes (Signed)
[name], [name] ([id number]) Visit Report for 09/09/2021 Allergy List Details Patient Name: [name], [name] Date of Service: 09/09/2021 8:45 AM Medical Record Number: [id number] Patient Account Number: [id number] Date of Birth/Sex: 07-31-1970 (51 y.o. F) Treating RN: Carlene Coria Primary Care Manal Kreutzer: Alma Friendly Other Clinician: Referring Dean Goldner: Alma Friendly Treating Bryan Omura/Extender: Skipper Cliche in Treatment: 0 Allergies Active Allergies penicillin Reaction: rash Severity: Moderate Singulair Reaction: tongue swelling Severity: Severe metoprolol morphine Reaction: face swelling, unable to swallow Severity: Severe prednisone Reaction: throat swelling Severity: Severe diltiazem Allergy Notes Electronic Signature(s) Signed: 09/10/2021 1:46:31 PM By: Carlene Coria RN Entered By: Carlene Coria on 09/09/2021 09:05:26 [place], [name] ([id number]) -------------------------------------------------------------------------------- Arrival Information Details Patient Name: [name] Date of Service: 09/09/2021 8:45 AM Medical Record Number: [id number] Patient Account Number: [id number] Date of Birth/Sex: Aug 03, 1970 (51 y.o. F) Treating RN: Carlene Coria Primary Care Mckoy Bhakta: Alma Friendly Other Clinician: Referring Filomena Pokorney: Alma Friendly Treating Bless Belshe/Extender: Skipper Cliche in Treatment: 0 Visit Information Patient Arrived: Ambulatory Arrival Time: 08:54 Accompanied By: self Transfer Assistance: None Patient Identification Verified: Yes Secondary Verification Process Completed: Yes Patient Requires Transmission-Based Precautions: No Patient Has Alerts: No History Since Last Visit All ordered tests and consults were completed: No Added or deleted any medications: No Any new allergies or adverse reactions: No Had a fall or experienced change in activities of daily living that may affect risk of falls: No Signs or symptoms of abuse/neglect since last  visito No Hospitalized since last visit: No Implantable device outside of the clinic excluding cellular tissue based products placed in the center since last visit: No Has Dressing in Place as Prescribed: Yes Electronic Signature(s) Signed: 09/10/2021 1:46:31 PM By: Carlene Coria RN Entered By: Carlene Coria on 09/09/2021 09:02:55 [name], [name] ([id number]) -------------------------------------------------------------------------------- Clinic Level of Care Assessment Details Patient Name: [name] Date of Service: 09/09/2021 8:45 AM Medical Record Number: [id number] Patient Account Number: [id number] Date of Birth/Sex: January 11, 1971 (51 y.o. F) Treating RN: Carlene Coria Primary Care Zhana Jeangilles: Alma Friendly Other Clinician: Referring Tamra Koos: Alma Friendly Treating Dezirea Mccollister/Extender: Skipper Cliche in Treatment: 0 Clinic Level of Care Assessment Items TOOL 2 Quantity Score X - Use when only an EandM is performed on the INITIAL visit 1 0 ASSESSMENTS - Nursing Assessment / Reassessment X - General Physical Exam (combine w/ comprehensive assessment (listed just below) when performed on new 1 20 pt. evals) X- 1 25 Comprehensive Assessment (HX, ROS, Risk Assessments, Wounds Hx, etc.) ASSESSMENTS - Wound and Skin Assessment / Reassessment []  - Simple Wound Assessment / Reassessment - one wound 0 X- 3 5 Complex Wound Assessment / Reassessment - multiple wounds []  - 0 Dermatologic / Skin Assessment (not related to wound area) ASSESSMENTS - Ostomy and/or Continence Assessment and Care []  - Incontinence Assessment and Management 0 []  - 0 Ostomy Care Assessment and Management (repouching, etc.) PROCESS - Coordination of Care X - Simple Patient / Family Education for ongoing care 1 15 []  - 0 Complex (extensive) Patient / Family Education for ongoing care []  - 0 Staff obtains Programmer, systems, Records, Test Results / Process Orders []  - 0 Staff telephones HHA, Nursing Homes / Clarify  orders / etc []  - 0 Routine Transfer to another Facility (non-emergent condition) []  - 0 Routine Hospital Admission (non-emergent condition) X- 1 15 New Admissions / Biomedical engineer / Ordering NPWT, Apligraf, etc. []  - 0 Emergency Hospital Admission (emergent condition) X- 1 10 Simple Discharge Coordination []  - 0 Complex (extensive) Discharge Coordination PROCESS - Special Needs []  - Pediatric / Minor  Patient Management 0 []  - 0 Isolation Patient Management []  - 0 Hearing / Language / Visual special needs []  - 0 Assessment of Community assistance (transportation, D/C planning, etc.) []  - 0 Additional assistance / Altered mentation []  - 0 Support Surface(s) Assessment (bed, cushion, seat, etc.) INTERVENTIONS - Wound Cleansing / Measurement X - Wound Imaging (photographs - any number of wounds) 1 5 []  - 0 Wound Tracing (instead of photographs) []  - 0 Simple Wound Measurement - one wound X- 3 5 Complex Wound Measurement - multiple wounds [name], [name] ([id number]) []  - 0 Simple Wound Cleansing - one wound X- 3 5 Complex Wound Cleansing - multiple wounds INTERVENTIONS - Wound Dressings X - Small Wound Dressing one or multiple wounds 3 10 []  - 0 Medium Wound Dressing one or multiple wounds []  - 0 Large Wound Dressing one or multiple wounds []  - 0 Application of Medications - injection INTERVENTIONS - Miscellaneous []  - External ear exam 0 []  - 0 Specimen Collection (cultures, biopsies, blood, body fluids, etc.) []  - 0 Specimen(s) / Culture(s) sent or taken to Lab for analysis []  - 0 Patient Transfer (multiple staff / Civil Service fast streamer / Similar devices) []  - 0 Simple Staple / Suture removal (25 or less) []  - 0 Complex Staple / Suture removal (26 or more) []  - 0 Hypo / Hyperglycemic Management (close monitor of Blood Glucose) X- 1 15 Ankle / Brachial Index (ABI) - do not check if billed separately Has the patient been seen at the hospital within the last  three years: Yes Total Score: 180 Level Of Care: New/Established - Level 5 Electronic Signature(s) Signed: 09/10/2021 1:46:31 PM By: Carlene Coria RN Entered By: Carlene Coria on 09/09/2021 10:01:11 [name] ([id number]) -------------------------------------------------------------------------------- Encounter Discharge Information Details Patient Name: [name] Date of Service: 09/09/2021 8:45 AM Medical Record Number: [id number] Patient Account Number: [id number] Date of Birth/Sex: 12/11/1970 (50 y.o. F) Treating RN: Carlene Coria Primary Care Beata Beason: Alma Friendly Other Clinician: Referring Faria Casella: Alma Friendly Treating Elyse Prevo/Extender: Skipper Cliche in Treatment: 0 Encounter Discharge Information Items Post Procedure Vitals Discharge Condition: Stable Temperature (F): 98 Ambulatory Status: Cane Pulse (bpm): 91 Discharge Destination: Home Respiratory Rate (breaths/min): 18 Transportation: Private Auto Blood Pressure (mmHg): 137/80 Accompanied By: husband Schedule Follow-up Appointment: Yes Clinical Summary of Care: Patient Declined Electronic Signature(s) Signed: 09/09/2021 10:02:50 AM By: Carlene Coria RN Entered By: Carlene Coria on 09/09/2021 10:02:50 [name] ([id number]) -------------------------------------------------------------------------------- Lower Extremity Assessment Details Patient Name: [name] Date of Service: 09/09/2021 8:45 AM Medical Record Number: [id number] Patient Account Number: [id number] Date of Birth/Sex: [birth year] (50 y.o. F) Treating RN: Carlene Coria Primary Care Torell Minder: Alma Friendly Other Clinician: Referring Perrie Ragin: Alma Friendly Treating Shaw Dobek/Extender: Skipper Cliche in Treatment: 0 Edema Assessment Assessed: [Left: No] [Right: No] Edema: [Left: Yes] [Right: Yes] Calf Left: Right: Point of Measurement: 32 cm From Medial Instep 44 cm 43 cm Ankle Left: Right: Point of Measurement: 10 cm  From Medial Instep 24 cm 23 cm Knee To Floor Left: Right: From Medial Instep 44 cm 44 cm Vascular Assessment Pulses: Dorsalis Pedis Palpable: [Left:Yes] [Right:Yes] Doppler Audible: [Left:Yes] [Right:Yes] Blood Pressure: Brachial: [Left:137] [Right:137] Ankle: [Left:Dorsalis Pedis: 120 0.88] [Right:Dorsalis Pedis: 120 0.88] Electronic Signature(s) Signed: 09/10/2021 1:46:31 PM By: Carlene Coria RN Entered By: Carlene Coria on 09/09/2021 09:26:04 [place], [name] ([id number]) -------------------------------------------------------------------------------- Multi Wound Chart Details Patient Name: [name] Date of Service: 09/09/2021 8:45 AM Medical Record Number: [id number] Patient Account Number: [id number] Date of Birth/Sex: 16-Sep-1970 (50 y.o. 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 Extremity Comorbid History: Asthma, Chronic Obstructive Asthma, Chronic Obstructive Asthma, Chronic Obstructive Pulmonary Disease (COPD), Sleep Pulmonary Disease (COPD), Sleep Pulmonary Disease (COPD), Sleep Apnea, Arrhythmia, Congestive Apnea, Arrhythmia, Congestive Apnea, Arrhythmia, Congestive Heart Failure, Coronary Artery Heart Failure, Coronary Artery Heart Failure, Coronary Artery Disease, Hypertension, Type II Disease, Hypertension, Type II Disease, Hypertension, Type II Diabetes, Neuropathy, Received Diabetes, Neuropathy,  Received Diabetes, Neuropathy, Received Chemotherapy, Received Radiation Chemotherapy, Received Radiation Chemotherapy, Received Radiation Date Acquired: 08/11/2021 08/11/2021 08/11/2021 Weeks of Treatment: 0 0 0 Wound Status: Open Open Open Wound Recurrence: No No No Measurements L x W x D (cm) 2x2x0.1 3.5x5x0.1 4.5x3x0.1 Area (cm) : 3.142 13.744 10.603 Volume (cm) : 0.314 1.374 1.06 % Reduction in Area: 0.00% N/A 0.00% % Reduction in Volume: 0.00% N/A 0.00% Starting Position 1 (o'clock): 12 Ending Position 1 (o'clock): 12 Maximum Distance 1 (cm): 3 Undermining: No Yes No Classification: Grade 2 Category/Stage III Grade 1 Exudate Amount: None Present Medium Medium Exudate Type: N/A Serosanguineous Serosanguineous Exudate Color: N/A red, brown red, brown Granulation Amount: None Present (0%) Medium (34-66%) Medium (34-66%) Granulation Quality: N/A Red, Pink Red, Pink Necrotic Amount: Large (67-100%) Medium (34-66%) Medium (34-66%) Necrotic Tissue: Eschar, Adherent [place] Exposed Structures: Fascia: No Fat Layer (Subcutaneous Tissue): Fat Layer (Subcutaneous Tissue): Fat Layer (Subcutaneous Tissue): Yes Yes No Fascia: No Fascia: No Tendon: No Tendon: No Tendon: No Muscle: No Muscle: No Muscle: No Joint: No Joint: No Joint: No Bone: No Bone: No Bone: No Epithelialization: N/A None None Treatment Notes [name], [name] ([id number]) Electronic Signature(s) Signed: 09/10/2021 1:46:31 PM By: Carlene Coria RN Entered By: Carlene Coria on 09/09/2021 09:44:21 [name], [name] ([id number]) -------------------------------------------------------------------------------- Multi-Disciplinary Care Plan Details Patient Name: [name] Date of Service: 09/09/2021 8:45 AM Medical Record Number: [id number] Patient Account Number: [id number] Date of Birth/Sex: 1971/04/10 (50 y.o. F) Treating RN: Carlene Coria Primary Care Teana Lindahl: Alma Friendly Other  Clinician: Referring Kennedee Kitzmiller: Alma Friendly Treating Franciscojavier Wronski/Extender: Skipper Cliche in Treatment: 0 Active Inactive Abuse / Safety / Falls / Self Care Management Nursing Diagnoses: Potential for falls Goals: Patient will remain injury free related to falls Date Initiated: 09/09/2021 Target Resolution Date: 10/11/2021 Goal Status: Active Interventions: Assess Activities of Daily Living upon admission and as needed Assess fall risk on admission and as needed Assess: immobility, friction, shearing, incontinence upon admission and as needed Assess impairment of mobility on admission and as needed per policy Assess personal safety and home safety (as indicated) on admission and as needed Assess self care needs on admission and as needed Notes: Wound/Skin Impairment Nursing Diagnoses: Knowledge deficit related to ulceration/compromised skin integrity Goals: Patient/caregiver will verbalize understanding of skin care regimen Date Initiated: 09/09/2021 Target Resolution Date: 10/11/2021 Goal Status: Active Ulcer/skin breakdown will have a volume reduction of 30% by week 4 Date Initiated: 09/09/2021 Target Resolution Date: 11/10/2021 Goal Status: Active Ulcer/skin breakdown will have a volume reduction of 50% by week 8 Date Initiated: 09/09/2021 Target Resolution Date: 12/11/2021 Goal Status: Active Ulcer/skin breakdown will have a volume reduction of 80% by week  12 Date Initiated: 09/09/2021 Target Resolution Date: 01/10/2022 Goal Status: Active Ulcer/skin breakdown will heal within 14 weeks Date Initiated: 09/09/2021 Target Resolution Date: 02/10/2022 Goal Status: Active Interventions: Assess patient/caregiver ability to obtain necessary supplies Assess patient/caregiver ability to perform ulcer/skin care regimen upon admission and as needed Assess ulceration(s) every visit Notes: Electronic Signature(s) Signed: 09/10/2021 1:46:31 PM By: Carlene Coria RN Entered By: Carlene Coria on  09/09/2021 09:44:07 [name], [name] ([id number]) [name], [name] ([id number]) -------------------------------------------------------------------------------- Pain Assessment Details Patient Name: [name] Date of Service: 09/09/2021 8:45 AM Medical Record Number: [id number] Patient Account Number: [id number] Date of Birth/Sex: 31-Oct-1970 (51 y.o. F) Treating RN: Carlene Coria Primary Care Talley Casco: Alma Friendly Other Clinician: Referring Allex Madia: Alma Friendly Treating Lavert Matousek/Extender: Skipper Cliche in Treatment: 0 Active Problems Location of Pain Severity and Description of Pain Patient Has Paino No Site Locations Pain Management and Medication Current Pain Management: Electronic Signature(s) Signed: 09/10/2021 1:46:31 PM By: Carlene Coria RN Entered By: Carlene Coria on 09/09/2021 [name], [name] ([id number]) -------------------------------------------------------------------------------- Patient/Caregiver Education Details Patient Name: [name] Date of Service: 09/09/2021 8:45 AM Medical Record Number: [id number] Patient Account Number: [id number] Date of Birth/Gender: 10-03-1970 (50 y.o. F) Treating RN: Carlene Coria Primary Care Physician: Alma Friendly Other Clinician: Referring Physician: Alma Friendly Treating Physician/Extender: Skipper Cliche in Treatment: 0 Education Assessment Education Provided To: Patient Education Topics Provided Welcome To [facility]: Methods: Explain/Verbal Responses: State content correctly Electronic Signature(s) Signed: 09/10/2021 1:46:31 PM By: Carlene Coria RN Entered By: Carlene Coria on 09/09/2021 10:01:37 [name] ([id number]) -------------------------------------------------------------------------------- Wound Assessment Details Patient Name: [name] Date of Service: 09/09/2021 8:45 AM Medical Record Number: [id number] Patient Account Number: [id number] Date of Birth/Sex: 06/23/1971  (50 y.o. F) Treating RN: Carlene Coria Primary Care Rechelle Niebla: Alma Friendly Other Clinician: Referring Jader Desai: Alma Friendly Treating Rei Medlen/Extender: Skipper Cliche in Treatment: 0 Wound Status Wound Number: 10 Primary Diabetic Wound/Ulcer of the Lower Extremity Etiology: Wound Location: Left, Dorsal Foot Wound Open Wounding Event: Gradually Appeared Status: Date Acquired: 08/11/2021 Comorbid Asthma, Chronic Obstructive Pulmonary Disease (COPD), Weeks Of Treatment: 0 History: Sleep Apnea, Arrhythmia, Congestive Heart Failure, Clustered Wound: No Coronary Artery Disease, Hypertension, Type II Diabetes, Neuropathy, Received Chemotherapy, Received Radiation Photos Wound Measurements Length: (cm) 2 Width: (cm) 2 Depth: (cm) 0.1 Area: (cm) 3.142 Volume: (cm) 0.314 % Reduction in Area: 0% % Reduction in Volume: 0% Tunneling: No Undermining: No Wound Description Classification: Grade 2 Exudate Amount: None Present Foul Odor After Cleansing: No Slough/Fibrino Yes Wound Bed Granulation Amount: None Present (0%) Exposed Structure Necrotic Amount: Large (67-100%) Fascia Exposed: No Necrotic Quality: Eschar, Adherent Slough Fat Layer (Subcutaneous Tissue) Exposed: No Tendon Exposed: No Muscle Exposed: No Joint Exposed: No Bone Exposed: No Treatment Notes Wound #10 (Foot) Wound Laterality: Dorsal, Left Cleanser Byram Ancillary Kit - 15 Day Supply Discharge Instruction: Use supplies as instructed; Kit contains: (15) Saline Bullets; (15) 3x3 Gauze; 15 pr Gloves Peri-Wound Care [name], [name] ([id number]) Topical Primary Dressing Gauze Discharge Instruction: As directed: dry, moistened with saline or moistened with Dakins Solution Santyl Collagenase Ointment, 30 (gm), tube Secondary Dressing Kerlix 4.5 x 4.1 (in/yd) Discharge Instruction: Apply Kerlix 4.5 x 4.1 (in/yd) as instructed Secured With 17M Medipore H Soft Cloth Surgical Tape, 2x2 (in/yd) Compression  Wrap Compression Stockings Add-Ons Electronic Signature(s) Signed: 09/10/2021 1:46:31 PM By: Carlene Coria RN Entered By: Carlene Coria on 09/09/2021 09:11:07 [place], [name] ([id number]) -------------------------------------------------------------------------------- Wound Assessment Details Patient Name: [name] Date of Service: 09/09/2021 8:45 AM Medical Record Number:  [id number] Patient Account Number: [id number] Date of Birth/Sex: 1970/07/19 (50 y.o. F) Treating RN: Carlene Coria Primary Care Mersadez Linden: Alma Friendly Other Clinician: Referring Gaylen Venning: Alma Friendly Treating Sascha Baugher/Extender: Skipper Cliche in Treatment: 0 Wound Status Wound Number: 11 Primary Pressure Ulcer Etiology: Wound Location: Left Calcaneus Wound Open Wounding Event: Gradually Appeared Status: Date Acquired: 08/11/2021 Comorbid Asthma, Chronic Obstructive Pulmonary Disease (COPD), Weeks Of Treatment: 0 History: Sleep Apnea, Arrhythmia, Congestive Heart Failure, Clustered Wound: No Coronary Artery Disease, Hypertension, Type II Diabetes, Neuropathy, Received Chemotherapy, Received Radiation Photos Wound Measurements Length: (cm) 3.5 % Reduc Width: (cm) 5 % Reduc Depth: (cm) 0.1 Epithel Area: (cm) 13.744 Tunnel Volume: (cm) 1.374 Underm Star Endi Maxi tion in Area: tion in Volume: ialization: None ing: No ining: Yes ting Position (o'clock): 12 ng Position (o'clock): 12 mum Distance: (cm) 3 Wound Description Classification: Category/Stage III Foul O Exudate Amount: Medium Slough Exudate Type: Serosanguineous Exudate Color: red, brown dor After Cleansing: No /Fibrino Yes Wound Bed Granulation Amount: Medium (34-66%) Exposed Structure Granulation Quality: Red, Pink Fascia Exposed: No Necrotic Amount: Medium (34-66%) Fat Layer (Subcutaneous Tissue) Exposed: Yes Necrotic Quality: Eschar, Adherent Slough Tendon Exposed: No Muscle Exposed: No Joint Exposed: No Bone Exposed:  No Treatment Notes Wound #11 (Calcaneus) Wound Laterality: Left Cleanser [name], [name] ([id number]) Byram Ancillary Kit - 15 Day Supply Discharge Instruction: Use supplies as instructed; Kit contains: (15) Saline Bullets; (15) 3x3 Gauze; 15 pr Gloves Peri-Wound Care Topical Primary Dressing Gauze Discharge Instruction: As directed: dry, moistened with saline or moistened with Dakins Solution Santyl Collagenase Ointment, 30 (gm), tube Secondary Dressing Kerlix 4.5 x 4.1 (in/yd) Discharge Instruction: Apply Kerlix 4.5 x 4.1 (in/yd) as instructed Secured With 88M Medipore H Soft Cloth Surgical Tape, 2x2 (in/yd) Compression Wrap Compression Stockings Add-Ons Electronic Signature(s) Signed: 09/10/2021 1:46:31 PM By: Carlene Coria RN Entered By: Carlene Coria on 09/09/2021 09:13:05 [name], [name] ([id number]) -------------------------------------------------------------------------------- Wound Assessment Details Patient Name: [name] Date of Service: 09/09/2021 8:45 AM Medical Record Number: [id number] Patient Account Number: [id number] Date of Birth/Sex: 01-Oct-1970 (51 y.o. F) Treating RN: Carlene Coria Primary Care Larayah Clute: Alma Friendly Other Clinician: Referring Barnett Elzey: Alma Friendly Treating Jary Louvier/Extender: Skipper Cliche in Treatment: 0 Wound Status Wound Number: 12 Primary Diabetic Wound/Ulcer of the Lower Extremity Etiology: Wound Location: Right, Lateral Lower Leg Wound Open Wounding Event: Trauma Status: Date Acquired: 08/11/2021 Comorbid Asthma, Chronic Obstructive Pulmonary Disease (COPD), Weeks Of Treatment: 0 History: Sleep Apnea, Arrhythmia, Congestive Heart Failure, Clustered Wound: No Coronary Artery Disease, Hypertension, Type II Diabetes, Neuropathy, Received Chemotherapy, Received Radiation Photos Wound Measurements Length: (cm) 4.5 Width: (cm) 3 Depth: (cm) 0.1 Area: (cm) 10.603 Volume: (cm) 1.06 % Reduction in Area: 0% % Reduction  in Volume: 0% Epithelialization: None Tunneling: No Undermining: No Wound Description Classification: Grade 1 Exudate Amount: Medium Exudate Type: Serosanguineous Exudate Color: red, brown Foul Odor After Cleansing: No Slough/Fibrino Yes Wound Bed Granulation Amount: Medium (34-66%) Exposed Structure Granulation Quality: Red, Pink Fascia Exposed: No Necrotic Amount: Medium (34-66%) Fat Layer (Subcutaneous Tissue) Exposed: Yes Necrotic Quality: Adherent Slough Tendon Exposed: No Muscle Exposed: No Joint Exposed: No Bone Exposed: No Treatment Notes Wound #12 (Lower Leg) Wound Laterality: Right, Lateral Cleanser Byram Ancillary Kit - 15 Day Supply Discharge Instruction: Use supplies as instructed; Kit contains: (15) Saline Bullets; (15) 3x3 Gauze; 15 pr Gloves Peri-Wound Care [name], [name] ([id number]) Topical Primary Dressing Gauze Discharge Instruction: As directed: dry, moistened with saline or moistened with Dakins Solution Santyl Collagenase Ointment, 30 (gm), tube Secondary Dressing  Kerlix 4.5 x 4.1 (in/yd) Discharge Instruction: Apply Kerlix 4.5 x 4.1 (in/yd) as instructed Secured With 53M [place] Surgical Tape, 2x2 (in/yd) Compression Wrap Compression Stockings Add-Ons Electronic Signature(s) Signed: 09/10/2021 1:46:31 PM By: Carlene Coria RN Entered By: Carlene Coria on 09/09/2021 09:16:25 [name], [name] ([id number]) -------------------------------------------------------------------------------- Vitals Details Patient Name: [name] Date of Service: 09/09/2021 8:45 AM Medical Record Number: [id number] Patient Account Number: [id number] Date of Birth/Sex: 08-10-1970 (51 y.o. F) Treating RN: Carlene Coria Primary Care Avalin Briley: Alma Friendly Other Clinician: Referring Dylin Breeden: Alma Friendly Treating Delesha Pohlman/Extender: Skipper Cliche in Treatment: 0 Vital Signs Time Taken: 09:03 Temperature (F): 98 Height (in): 64 Pulse (bpm):  91 Source: Stated Respiratory Rate (breaths/min): 18 Weight (lbs): 219 Blood Pressure (mmHg): 137/80 Source: Stated Reference Range: 80 - 120 mg / dl Body Mass Index (BMI): 37.6 Electronic Signature(s) Signed: 09/10/2021 1:46:31 PM By: Carlene Coria RN Entered By: Carlene Coria on 09/09/2021 09:03:29

## 2021-09-10 NOTE — Assessment & Plan Note (Addendum)
#   T2N0- ER/PR-positive; premenopausal currently on Anastrazole [stopped tam sec to poor tolerance].  Continue  EligardDepo 11.25 mg every 3 months. CT AUG, 29th 2022- CT C/A/P- NED.  ? ?# Continue anastrozole + Eligard for now.  Tolerating fairly well.  Eligard- today ? ?# Headache/migraines-awaiting neurology evaluation do not suspect for malignancy; [DW PCP]- STABLE. ?  ?#Depression/anxiety/bipolar-currently on BuSpar/Celexa/trazodone As per psychiatry- STABLE. ? ?#Chronic respiratory failure-CHF/COPD; STABLE. SEP 2022-CT scan [bronchiolitis from smoking]; discussed at length regarding quitting smoking/ states cutting down.  ? ?#Poorly controlled diabetes-on insulin; FBG AM- 317- [Dr.O'Connell endocrinology]-  Again stressed the importance of glucose control; especially with the neuropathy/lower extremity ulcers. ? ?#Peripheral neuropathy grade 2-3; patient goes on with a walker/cane at baseline- STABLE.  ? ?#Genetics: mom- breast cancer; older brother at 61s- brain tumor; dad- brain tumor. # Genetics: Discussed with the patient majority of breast cancers are sporadic however 10 to 20% at risk of genetic/hereditary cancer syndromes.  Discussed importance of genetic counseling/genetic testing.  Patient interested in genetic counseling.  We will make a referral.  ? ?DISPOSITION:  ?# Eligard today ?# refer to genetics re; breast cancer /coordinate appointment with next  office visit.  ?# follow up in 3 month- MD; labs- cbc/cmp/ca-27-29;estrogen; LH; FSH-  Eligard-Dr.B ? ? ?

## 2021-09-10 NOTE — Progress Notes (Signed)
[place] NOTE  Patient Care Team: Pleas Koch, NP as PCP - General (Internal Medicine) Rosamaria Lints, MD (Inactive) as Referring Physician (Neurology) Alvester Chou, NP (Nurse Practitioner) Renato Shin, MD as Consulting Physician (Endocrinology) Theodore Demark, RN as Oncology Nurse Navigator Ronny Bacon, MD as Consulting Physician (General Surgery) [place], [place] as Registered Nurse Flora Lipps, MD as Consulting Physician (Pulmonary Disease) Sherlynn Stalls, MD as Consulting Physician (Ophthalmology) Dannielle Karvonen, RN as [place] Management  CHIEF COMPLAINTS/PURPOSE OF CONSULTATION: Breast cancer.  #  Oncology History Overview Note  #April 2021-2.5 cm right breast mass [incidental-CT scan chest]  # April 2021- RIGHT BREAST [name]; s/p ER- 51-90%; PR- 51-90%; Her- 2-NEG. G-2. [Dr.Rodenberg]; s/p  Masectomy- pT2 pN0 (sn); Tamoxifen; Poor candidate for chemo;   # Discontinue tamoxifen early DEC 2021 [sec to nausea/vomiting diarrhea]  #Late December 2021- Anastrazole + Lupron 11.25 every 3 months  # DM- poorly controlled; Seizure disorder/Bipolar/TIA ; Diabetes PN- G-3 [cane/walker; falls]; COPD; OSA active smoker  # SURVIVORSHIP:   # GENETICS:   DIAGNOSIS: Right breast cancer  STAGE:    II     ;  GOALS: goal  CURRENT/MOST RECENT THERAPY : Anastrazole + Lupron 11.25 every 3 months     Carcinoma of lower-outer quadrant of right breast in female, estrogen receptor positive ([place])  11/12/2019 Initial Diagnosis   Carcinoma of lower-outer quadrant of right breast in female, estrogen receptor positive ([place])    HISTORY OF PRESENTING ILLNESS: Patient in independently.  Accompanied by son.  [name] 51 y.o.  female premenopausal patient with right-sided stage II ER/PR positive HER-2 negative breast cancer on adjuvant anastrozole +Lupron with multiple comorbidities-including poorly controlled diabetes/bipolar  disorder/active smoker/neuropathy is here for follow-up.  Patient has a history of falls attributed to neuropathy loss of balance.  Patient noted to have ulcer in the left heel s/p evaluation and wound care.  Patient continues to have elevated blood glucose;-evaluated by endocrinology.  Otherwise no fever no chills.  No nausea no vomiting.    Review of Systems  Constitutional:  Positive for malaise/fatigue. Negative for chills, diaphoresis, fever and weight loss.  HENT:  Negative for nosebleeds and sore throat.   Eyes:  Negative for double vision.  Respiratory:  Negative for cough, hemoptysis, sputum production, shortness of breath and wheezing.   Cardiovascular:  Negative for chest pain, palpitations, orthopnea and leg swelling.  Gastrointestinal:  Negative for abdominal pain, blood in stool, constipation, diarrhea, heartburn, melena, nausea and vomiting.  Genitourinary:  Negative for dysuria, frequency and urgency.  Musculoskeletal:  Positive for back pain, joint pain and myalgias.  Skin:  Negative for itching.  Neurological:  Positive for tingling. Negative for dizziness, focal weakness, weakness and headaches.  Endo/Heme/Allergies:  Does not bruise/bleed easily.  Psychiatric/Behavioral:  Negative for depression. The patient is not nervous/anxious and does not have insomnia.     MEDICAL HISTORY:  Past Medical History:  Diagnosis Date   Anxiety    takes Prozac daily   Anxiety    Aortic valve calcification    Asthma    Advair and Spirva daily   Asthma    Bipolar disorder ([place])    Breast cancer, right breast ([place]) 12/23/2019   CAD (coronary artery disease)    a. LHC 11/2013 done for CP/fluid retention: mild disease in prox LAD, mild-mod disease in mRCA, EF 60% with normal LVEDP. b. Normal nuc 03/2016.   Cancer (HCC)    Cellulitis  and abscess of trunk 03/12/2020   CHF (congestive heart failure) (HCC)    Chronic diastolic CHF (congestive heart failure) (HCC)    Chronic heart failure  with preserved ejection fraction ([place]) 11/16/2018   CKD (chronic kidney disease), stage II    COPD (chronic obstructive pulmonary disease) (HCC)    a. nocturnal O2.   COPD (chronic obstructive pulmonary disease) (HCC)    Coronary artery disease    Decreased urine stream    Diabetes mellitus    Diabetes mellitus without complication (HCC)    Dyspnea    Elevated C-reactive protein (CRP) 01/29/2018   Elevated sedimentation rate 01/29/2018   Elevated serum hCG 01/19/2018   Family history of adverse reaction to anesthesia    mom gets nauseated   Foot pain, bilateral 05/22/2018   GERD (gastroesophageal reflux disease)    takes Pepcid daily   History of blood clots    left leg 3-27yr ago   Hyperlipidemia    Hypertension    Hypertriglyceridemia    Inguinal hernia, left 01/2015   Muscle spasm    Open wound of genital labia    Peripheral neuropathy    RBBB    [place]    [place]is [place] 01/19/2018   Sinus tachycardia    a. persistent since 2009.   Smokers' cough ([place]    Status post mastectomy, right 01/14/2020   Stroke ([facility] 1989   left sided weakness   TIA (transient ischemic attack)    Tobacco abuse    Vulvar abscess 01/23/2018    SURGICAL HISTORY: Past Surgical History:  Procedure Laterality Date   APPLICATION OF WOUND VAC Right 01/29/2020   Procedure: APPLICATION OF WOUND VAC;  Surgeon: RRonny Bacon MD;  Location: [facility];  Service: General;  Laterality: Right;   BREAST BIOPSY Right 11/06/2019   uKoreacore path pending venus clip   HERNIA REPAIR Left    INCISION AND DRAINAGE ABSCESS N/A 01/26/2018   Procedure: INCISION AND DEBRIDEMENT OF VULVAR NECROTIZING SOFT TISSUE INFECTION;  Surgeon: WGreer Pickerel MD;  [place]  Service: General;  Laterality: N/A;   INCISION AND DRAINAGE ABSCESS N/A 07/21/2021   Procedure: INCISION AND DRAINAGE ABSCESS Scalp;  Surgeon: BRobert Bellow MD;  [facility];  Service: General;  Laterality: N/A;  scalp and right abdomen    INCISION AND DRAINAGE PERIRECTAL ABSCESS N/A 01/22/2018   Procedure: IRRIGATION AND DEBRIDEMENT LABIAL/VULVAR AREA;  Surgeon: BCoralie Keens MD;  [place] [facility]  Service: General;  Laterality: N/A;   INCISION AND DRAINAGE PERIRECTAL ABSCESS N/A 01/29/2018   Procedure: IRRIGATION AND DEBRIDEMENT VULVA;  Surgeon: HExcell Seltzer MD;  [place] [facility]  Service: General;  Laterality: N/A;   INGUINAL HERNIA REPAIR Left 04/08/2015   Procedure: OPEN LEFT INGUINAL HERNIA REPAIR WITH MESH;  Surgeon: ARalene Ok MD;  Location: [facility]  Service: General;  Laterality: Left;   INSERTION OF MESH Left 04/08/2015   Procedure: INSERTION OF MESH;  Surgeon: ARalene Ok MD;  [place] [facility] [place]  Service: General;  Laterality: Left;   IRRIGATION AND DEBRIDEMENT ABSCESS N/A 07/21/2021   Procedure: IRRIGATION AND DEBRIDEMENT ABSCESS abdomen;  Surgeon: BRobert Bellow MD;  Location: [facility];  Service: General;  Laterality: N/A;   LAPAROSCOPY     Endometriosis   LEFT HEART CATHETERIZATION WITH CORONARY ANGIOGRAM N/A 12/05/2013   Procedure: LEFT HEART CATHETERIZATION WITH CORONARY ANGIOGRAM;  Surgeon: JJettie Booze MD;  [facility];  Service: Cardiovascular;  Laterality: N/A;   MASTECTOMY Right  right kidney drained     SIMPLE MASTECTOMY WITH AXILLARY SENTINEL NODE BIOPSY Right 12/23/2019   Procedure: Right SIMPLE MASTECTOMY WITH AXILLARY SENTINEL NODE BIOPSY;  Surgeon: Ronny Bacon, MD;  Location: [facility];  Service: General;  Laterality: Right;   TEE WITHOUT CARDIOVERSION N/A 11/28/2013   Procedure: TRANSESOPHAGEAL ECHOCARDIOGRAM (TEE);  Surgeon: Thayer Headings, MD;  Location: [place];  Service: Cardiovascular;  Laterality: N/A;   WOUND DEBRIDEMENT Right 01/29/2020   Procedure: DEBRIDEMENT WOUND, Excisional debridement skin, subcutaneous and muscle right chest wall;  Surgeon: Ronny Bacon, MD;  Location: [facility];  Service: General;  Laterality: Right;    SOCIAL  HISTORY: Social History   Socioeconomic History   Marital status: Married    Spouse name: Not on file   Number of children: 2   Years of education: Not on file   Highest education level: Not on file  Occupational History   Not on file  Tobacco Use   Smoking status: Some Days    Packs/day: 0.25    Years: 36.00    Pack years: 9.00    Types: Cigarettes    Start date: 07/11/1981   Smokeless tobacco: Never   Tobacco comments:    1 ppd now  Vaping Use   Vaping Use: Some days  Substance and Sexual Activity   Alcohol use: No   Drug use: No   Sexual activity: Not Currently  Other Topics Concern   Not on file  Social History Narrative   ** Merged History Encounter **       Lives in [place]   Married but lives with Boyfriend - not legally separated   Disabled - for BiPolar, Seizure disorder, diabetes   Formerly worked at [employer] 1 ppd; no alcohol. 2 step sons; no biologic children.       Social Determinants of Radio broadcast assistant Strain: Not on file  Food Insecurity: No Food Insecurity   Worried About Charity fundraiser in the Last Year: Never true   Arboriculturist in the Last Year: Never true  Transportation Needs: Not on file  Physical Activity: Not on file  Stress: Not on file  Social Connections: Not on file  Intimate Partner Violence: Not on file    FAMILY HISTORY: Family History  Problem Relation Age of Onset   Venous thrombosis Brother    Other Brother        BRAIN TUMOR   Asthma Father    Diabetes Father    Coronary artery disease Mother    Hypertension Mother    Diabetes Mother    Breast cancer Mother 77   Asthma Sister    Diabetes type II Brother    Diabetes Brother     ALLERGIES:  is allergic to adhesive [tape], metoprolol, montelukast, morphine sulfate, penicillins, silicone, diltiazem, gabapentin, and midodrine.  MEDICATIONS:  Current Outpatient Medications  Medication Sig Dispense Refill   albuterol (VENTOLIN HFA) 108  (90 Base) MCG/ACT inhaler Inhale 2 puffs into the lungs every 6 (six) hours as needed for wheezing or shortness of breath.     ALPRAZolam (XANAX) 1 MG tablet Take 1 mg by mouth 4 (four) times daily as needed for anxiety.      anastrozole (ARIMIDEX) 1 MG tablet Take 1 tablet (1 mg total) by mouth daily. 30 tablet 3   aspirin EC 81 MG tablet Take 81 mg by mouth daily before breakfast.     blood glucose  meter kit and supplies Use up to four times daily as directed. (FOR ICD-10 E10.9, E11.9). 1 each 0   budesonide-formoterol (SYMBICORT) 80-4.5 MCG/ACT inhaler INHALE 2 PUFFS BY MOUTH EVERY 12 HOURS TO PREVENT COUGH OR WHEEZING *RINSE MOUTH AFTER EACH USE* 10.2 g 2   busPIRone (BUSPAR) 30 MG tablet Take 30 mg by mouth 2 (two) times daily.     citalopram (CELEXA) 20 MG tablet Take 1 tablet (20 mg total) by mouth daily. 30 tablet 2   desvenlafaxine (PRISTIQ) 100 MG 24 hr tablet Take 100 mg by mouth daily.     Empagliflozin-metFORMIN HCl ER 25-1000 MG TB24 Take 1 tablet by mouth daily with breakfast.     EYLEA 2 MG/0.05ML SOSY      glucose blood test strip Use as instructed 300 each 0   ondansetron (ZOFRAN) 8 MG tablet One pill every 8 hours as needed for nausea/vomitting. 40 tablet 1   OXYGEN Inhale 2-4 L into the lungs 2 (two) times daily as needed (shortness of breath).     pregabalin (LYRICA) 300 MG capsule Take 1 capsule (300 mg total) by mouth 2 (two) times daily. For nerve pain. 180 capsule 0   ReliOn Ultra Thin Lancets 30G MISC Use as directed for blood sugar checks. 300 each 0   rOPINIRole (REQUIP) 1 MG tablet TAKE 1 TABLET BY MOUTH EVERY DAY AT BEDTIME FOR  RESTLESS  LEG 90 tablet 0   rosuvastatin (CRESTOR) 20 MG tablet Take 1 tablet (20 mg total) by mouth daily. For cholesterol. 90 tablet 3   spironolactone (ALDACTONE) 50 MG tablet Take 50 mg by mouth daily.     traZODone (DESYREL) 100 MG tablet Take 200 mg by mouth at bedtime.     Insulin Pen Needle 32G X 4 MM MISC Used to give insulin  injections twice daily. (Patient not taking: Reported on 09/10/2021) 100 each 11   insulin regular human CONCENTRATED (HUMULIN R) 500 UNIT/ML injection Inject 30 Units into the skin 3 (three) times daily with meals. Sliding scale (Patient not taking: Reported on 09/10/2021)     Insulin Syringe-Needle U-100 (GLOBAL INJECT EASE INSULIN SYR) 30G X 1/2" 1 ML MISC See admin instructions.     mupirocin ointment (BACTROBAN) 2 % Apply 1 application topically 2 (two) times daily. 22 g 0   traMADol (ULTRAM) 50 MG tablet Take 1 tablet (50 mg total) by mouth every 6 (six) hours as needed. 20 tablet 0   ULTICARE MINI PEN NEEDLES 31G X 6 MM MISC USE AS DIRECTED 3 TIMES DAILY (Patient not taking: Reported on 09/10/2021) 100 each 0   Current Facility-Administered Medications  Medication Dose Route Frequency Provider Last Rate Last Admin   lidocaine-EPINEPHrine (XYLOCAINE W/EPI) 2 %-1:100000 (with pres) injection 1.5 mL  1.5 mL Intradermal Once Michela Pitcher, NP          .  PHYSICAL EXAMINATION: ECOG PERFORMANCE STATUS: 1 - Symptomatic but completely ambulatory  Vitals:   09/10/21 1000  BP: 129/74  Pulse: 74  Resp: 16   Filed Weights   09/10/21 1000  Weight: 222 lb 9.6 oz (101 kg)    Physical Exam HENT:     Head: Normocephalic and atraumatic.     Mouth/Throat:     Pharynx: No oropharyngeal exudate.  Eyes:     Pupils: Pupils are equal, round, and reactive to light.  Cardiovascular:     Rate and Rhythm: Normal rate and regular rhythm.  Pulmonary:     Effort: No  respiratory distress.     Breath sounds: No wheezing.     Comments: Decreased breath sounds bilaterally the bases. Abdominal:     General: Bowel sounds are normal. There is no distension.     Palpations: Abdomen is soft. There is no mass.     Tenderness: There is no abdominal tenderness. There is no guarding or rebound.  Musculoskeletal:        General: No tenderness. Normal range of motion.     Cervical back: Normal range of motion  and neck supple.  Skin:    General: Skin is warm.  Neurological:     Mental Status: She is alert and oriented to person, place, and time.  Psychiatric:        Mood and Affect: Affect normal.     LABORATORY DATA:  I have reviewed the data as listed Lab Results  Component Value Date   WBC 9.0 09/10/2021   HGB 12.2 09/10/2021   HCT 38.9 09/10/2021   MCV 89.0 09/10/2021   PLT 202 09/10/2021   Recent Labs    04/14/21 0853 05/12/21 1015 07/21/21 1212 09/10/21 1003  NA 133* 135 135 131*  K 4.6 4.6 4.2 4.2  CL 99 98 104 98  CO2 _0 GLUCOSE 416* 317* 255* 353*  BUN 29* 25* 21* 36*  CREATININE 1.24* 1.04* 0.95 1.03*  CALCIUM 9.1 9.1 8.9 8.9  GFRNONAA 52* >60 >60 >60  PROT 9.4* 8.6*  --  9.3*  ALBUMIN 4.0 3.8  --  4.0  AST 16 14*  --  14*  ALT 18 18  --  15  ALKPHOS 166* 191*  --  129*  BILITOT 1.0 0.8  --  0.6       RADIOGRAPHIC STUDIES: I have personally reviewed the radiological images as listed and agreed with the findings in the report. No results found.   ASSESSMENT & PLAN:   Carcinoma of lower-outer quadrant of right breast in female, estrogen receptor positive ([place]) # T2N0- ER/PR-positive; premenopausal currently on Anastrazole [stopped tam sec to poor tolerance].  Continue  EligardDepo 11.25 mg every 3 months. CT AUG, 29th 2022- CT C/A/P- NED.   # Continue anastrozole + Eligard for now.  Tolerating fairly well.  Eligard- today  # Headache/migraines-awaiting neurology evaluation do not suspect for malignancy; [DW PCP]- STABLE.   #Depression/anxiety/bipolar-currently on BuSpar/Celexa/trazodone As per psychiatry- STABLE.  #Chronic respiratory failure-CHF/COPD; STABLE. SEP 2022-CT scan [bronchiolitis from smoking]; discussed at length regarding quitting smoking/ states cutting down.   #Poorly controlled diabetes-on insulin; FBG AM- 317- [Dr.O'Connell endocrinology]-  Again stressed the importance of glucose control; especially with the  neuropathy/lower extremity ulcers.  #Peripheral neuropathy grade 2-3; patient goes on with a walker/cane at baseline- STABLE.   #Genetics: mom- breast cancer; older brother at 82s- brain tumor; dad- brain tumor. # Genetics: Discussed with the patient majority of breast cancers are sporadic however 10 to 20% at risk of genetic/hereditary cancer syndromes.  Discussed importance of genetic counseling/genetic testing.  Patient interested in genetic counseling.  We will make a referral.   DISPOSITION:  # Eligard today # refer to genetics re; breast cancer /coordinate appointment with next  office visit.  # follow up in 3 month- MD; labs- cbc/cmp/ca-27-29;estrogen; LH; [place]-  Eligard-Dr.B   All questions were answered. The patient knows to call the clinic with any problems, questions or concerns.    Cammie Sickle, MD 09/10/2021 4:33 PM

## 2021-09-10 NOTE — Telephone Encounter (Signed)
Dr. Jacinto Reap sent rx for Zofran ?

## 2021-09-10 NOTE — Progress Notes (Signed)
Patient has an ulcer on bottom of her left foot, pain 7/10, that is being treated by [facility].   ? ?Patient having nausea and is requesting prescription for antinausea medication. ?

## 2021-09-10 NOTE — Progress Notes (Signed)
[place], [place] ([id number]) ?Visit Report for 09/09/2021 ?Abuse Risk Screen Details ?Patient Name: [name], [name] ?Date of Service: 09/09/2021 8:45 AM ?Medical Record Number: [id number] ?Patient Account Number: [id number] ?Date of Birth/Sex: 10/05/1970 (51 y.o. F) ?Treating RN: Carlene Coria ?Primary Care Dezarai Prew: Alma Friendly Other Clinician: ?Referring Taresa Montville: Alma Friendly ?Treating Flannery Cavallero/Extender: Jeri Cos ?Weeks in Treatment: 0 ?Abuse Risk Screen Items ?Answer ?ABUSE RISK SCREEN: ?Has anyone close to you tried to hurt or harm you recentlyo No ?Do you feel uncomfortable with anyone in your familyo No ?Has anyone forced you do things that you didnot want to doo No ?Electronic Signature(s) ?Signed: 09/10/2021 1:46:31 PM By: Carlene Coria RN ?Entered By: Carlene Coria on 09/09/2021 09:05:50 ?Hardie, Tecla ([id number]) ?-------------------------------------------------------------------------------- ?Activities of Daily Living Details ?Patient Name: [name], [name] ?Date of Service: 09/09/2021 8:45 AM ?Medical Record Number: [id number] ?Patient Account Number: [id number] ?Date of Birth/Sex: December 11, 1970 (51 y.o. F) ?Treating RN: Carlene Coria ?Primary Care Afrika Brick: Alma Friendly Other Clinician: ?Referring Joao Mccurdy: Alma Friendly ?Treating Rubina Basinski/Extender: Jeri Cos ?Weeks in Treatment: 0 ?Activities of Daily Living Items ?Answer ?Activities of Daily Living (Please select one for each item) ?Drive Automobile Not Able ?Take Medications Need Assistance ?Use Telephone Need Assistance ?Care for Appearance Need Assistance ?Use Toilet Need Assistance ?Bath / Shower Need Assistance ?Dress Self Need Assistance ?Feed Self Completely Able ?Walk Completely Able ?Get In / Out Bed Need Assistance ?Housework Need Assistance ?Prepare Meals Need Assistance ?Handle Money Need Assistance ?Shop for Self Need Assistance ?Electronic Signature(s) ?Signed: 09/10/2021 1:46:31 PM By: Carlene Coria RN ?Entered ByCarlene Coria on  09/09/2021 09:07:26 ?Labrada, Mikelle ([id number]) ?-------------------------------------------------------------------------------- ?Education Screening Details ?Patient Name: [name], [name] ?Date of Service: 09/09/2021 8:45 AM ?Medical Record Number: [id number] ?Patient Account Number: [id number] ?Date of Birth/Sex: Aug 17, 1970 (52 y.o. F) ?Treating RN: Carlene Coria ?Primary Care Ebone Alcivar: Alma Friendly Other Clinician: ?Referring Verdon Ferrante: Alma Friendly ?Treating Yeison Sippel/Extender: Jeri Cos ?Weeks in Treatment: 0 ?Primary Learner Assessed: Patient ?Learning Preferences/Education Level/Primary Language ?Learning Preference: Explanation ?Highest Education Level: High School ?Preferred Language: English ?Cognitive Barrier ?Language Barrier: No ?Translator Needed: No ?Memory Deficit: No ?Emotional Barrier: No ?Cultural/Religious Beliefs Affecting Medical Care: No ?Physical Barrier ?Impaired Vision: Yes Glasses ?Impaired Hearing: No ?Decreased Hand dexterity: No ?Knowledge/Comprehension ?Knowledge Level: Medium ?Comprehension Level: High ?Ability to understand written instructions: High ?Ability to understand verbal instructions: High ?Motivation ?Anxiety Level: Anxious ?Cooperation: Cooperative ?Education Importance: Acknowledges Need ?Interest in Health Problems: Asks Questions ?Perception: Coherent ?Willingness to Engage in Self-Management ?Medium ?Activities: ?Readiness to Engage in Self-Management ?Medium ?Activities: ?Electronic Signature(s) ?Signed: 09/10/2021 1:46:31 PM By: Carlene Coria RN ?Entered ByCarlene Coria on 09/09/2021 09:07:58 ?Tourangeau, Raenah ([id number]) ?-------------------------------------------------------------------------------- ?Fall Risk Assessment Details ?Patient Name: [name], [name] ?Date of Service: 09/09/2021 8:45 AM ?Medical Record Number: [id number] ?Patient Account Number: [id number] ?Date of Birth/Sex: 1970-08-28 (51 y.o. F) ?Treating RN: Carlene Coria ?Primary Care Floraine Buechler: Alma Friendly Other Clinician: ?Referring Shiana Rappleye: Alma Friendly ?Treating Terren Jandreau/Extender: Jeri Cos ?Weeks in Treatment: 0 ?Fall Risk Assessment Items ?Have you had 2 or more falls in the last 12 monthso 0 Yes ?Have you had any fall that resulted in injury in the last 12 monthso 0 No ?FALLS RISK SCREEN ?History of falling - immediate or within 3 months 0 No ?Secondary diagnosis (Do you have 2 or more medical diagnoseso) 15 Yes ?Ambulatory aid ?None/bed rest/wheelchair/nurse 0 No ?Crutches/cane/walker 0 No ?Furniture 0 No ?Intravenous therapy Access/Saline/Heparin Lock 0 No ?Gait/Transferring ?Normal/ bed rest/ wheelchair 0 No ?Weak (short steps with or without shuffle, stooped but able to lift  head while walking, may ?0 No ?seek support from furniture) ?Impaired (short steps with shuffle, may have difficulty arising from chair, head down, impaired ?0 No ?balance) ?Mental Status ?Oriented to own ability 0 No ?Electronic Signature(s) ?Signed: 09/10/2021 1:46:31 PM By: Carlene Coria RN ?Entered ByCarlene Coria on 09/09/2021 09:08:39 ?Staheli, Candela ([id number]) ?-------------------------------------------------------------------------------- ?Foot Assessment Details ?Patient Name: [name], [name] ?Date of Service: 09/09/2021 8:45 AM ?Medical Record Number: [id number] ?Patient Account Number: [id number] ?Date of Birth/Sex: Jul 31, 1970 (51 y.o. F) ?Treating RN: Carlene Coria ?Primary Care Tanveer Dobberstein: Alma Friendly Other Clinician: ?Referring Antonella Upson: Alma Friendly ?Treating Sylena Lotter/Extender: Jeri Cos ?Weeks in Treatment: 0 ?Foot Assessment Items ?Site Locations ?+ = Sensation present, - = Sensation absent, C = Callus, U = Ulcer ?R = Redness, W = Warmth, M = Maceration, PU = Pre-ulcerative lesion ?F = Fissure, S = Swelling, D = Dryness ?Assessment ?Right: Left: ?Other Deformity: No No ?Prior Foot Ulcer: No No ?Prior Amputation: No No ?Charcot Joint: No No ?Ambulatory Status: Ambulatory Without Help ?Gait:  Steady ?Electronic Signature(s) ?Signed: 09/10/2021 1:46:31 PM By: Carlene Coria RN ?Entered By: Carlene Coria on 09/09/2021 09:10:02 ?Stroot, Larin ([id number]) ?-------------------------------------------------------------------------------- ?Nutrition Risk Screening Details ?Patient Name: [name], [name] ?Date of Service: 09/09/2021 8:45 AM ?Medical Record Number: [id number] ?Patient Account Number: [id number] ?Date of Birth/Sex: May 17, 1971 (51 y.o. F) ?Treating RN: Carlene Coria ?Primary Care Donielle Radziewicz: Alma Friendly Other Clinician: ?Referring Rabecca Birge: Alma Friendly ?Treating Jasan Doughtie/Extender: Jeri Cos ?Weeks in Treatment: 0 ?Height (in): 64 ?Weight (lbs): 219 ?Body Mass Index (BMI): 37.6 ?Nutrition Risk Screening Items ?Score Screening ?NUTRITION RISK SCREEN: ?I have an illness or condition that made me change the kind and/or amount of food I eat 0 No ?I eat fewer than two meals per day 0 No ?I eat few fruits and vegetables, or milk products 0 No ?I have three or more drinks of beer, liquor or wine almost every day 0 No ?I have tooth or mouth problems that make it hard for me to eat 0 No ?I don't always have enough money to buy the food I need 0 No ?I eat alone most of the time 0 No ?I take three or more different prescribed or over-the-counter drugs a day 1 Yes ?Without wanting to, I have lost or gained 10 pounds in the last six months 0 No ?I am not always physically able to shop, cook and/or feed myself 2 Yes ?Nutrition Protocols ?Good Risk Protocol ?Moderate Risk Protocol 0 Provide education on nutrition ?High Risk Proctocol ?Risk Level: Moderate Risk ?Score: 3 ?Electronic Signature(s) ?Signed: 09/10/2021 1:46:31 PM By: Carlene Coria RN ?Entered ByCarlene Coria on 09/09/2021 09:09:02 ?

## 2021-09-11 LAB — CANCER ANTIGEN 27.29: CA 27.29: 39.7 U/mL — ABNORMAL HIGH (ref 0.0–38.6)

## 2021-09-13 ENCOUNTER — Encounter: Payer: Self-pay | Admitting: Internal Medicine

## 2021-09-13 ENCOUNTER — Ambulatory Visit: Payer: Medicare Other | Admitting: Family

## 2021-09-13 ENCOUNTER — Encounter: Payer: Self-pay | Admitting: Podiatry

## 2021-09-13 ENCOUNTER — Ambulatory Visit (INDEPENDENT_AMBULATORY_CARE_PROVIDER_SITE_OTHER): Payer: BC Managed Care – PPO | Admitting: Podiatry

## 2021-09-13 ENCOUNTER — Other Ambulatory Visit: Payer: Self-pay

## 2021-09-13 DIAGNOSIS — L97422 Non-pressure chronic ulcer of left heel and midfoot with fat layer exposed: Secondary | ICD-10-CM | POA: Diagnosis not present

## 2021-09-13 DIAGNOSIS — E11622 Type 2 diabetes mellitus with other skin ulcer: Secondary | ICD-10-CM

## 2021-09-13 DIAGNOSIS — L97522 Non-pressure chronic ulcer of other part of left foot with fat layer exposed: Secondary | ICD-10-CM

## 2021-09-13 DIAGNOSIS — E1142 Type 2 diabetes mellitus with diabetic polyneuropathy: Secondary | ICD-10-CM | POA: Diagnosis not present

## 2021-09-13 DIAGNOSIS — L03116 Cellulitis of left lower limb: Secondary | ICD-10-CM

## 2021-09-13 DIAGNOSIS — L97911 Non-pressure chronic ulcer of unspecified part of right lower leg limited to breakdown of skin: Secondary | ICD-10-CM | POA: Diagnosis not present

## 2021-09-13 DIAGNOSIS — I739 Peripheral vascular disease, unspecified: Secondary | ICD-10-CM

## 2021-09-13 DIAGNOSIS — E1165 Type 2 diabetes mellitus with hyperglycemia: Secondary | ICD-10-CM | POA: Diagnosis not present

## 2021-09-13 DIAGNOSIS — F5101 Primary insomnia: Secondary | ICD-10-CM | POA: Diagnosis not present

## 2021-09-13 DIAGNOSIS — F3341 Major depressive disorder, recurrent, in partial remission: Secondary | ICD-10-CM | POA: Diagnosis not present

## 2021-09-13 DIAGNOSIS — F410 Panic disorder [episodic paroxysmal anxiety] without agoraphobia: Secondary | ICD-10-CM | POA: Diagnosis not present

## 2021-09-13 MED ORDER — GENTAMICIN SULFATE 0.1 % EX CREA: 1.0000 "application " | TOPICAL_CREAM | Freq: Every day | CUTANEOUS | 0 refills | Status: DC

## 2021-09-13 MED ORDER — DOXYCYCLINE HYCLATE 100 MG PO TABS: 100.0000 mg | ORAL_TABLET | Freq: Two times a day (BID) | ORAL | 0 refills | Status: DC

## 2021-09-13 NOTE — Patient Instructions (Addendum)
Call [phone] to schedule your consult with the vascular doctor  ?

## 2021-09-13 NOTE — Progress Notes (Signed)
?Subjective:  ?Patient ID: [name], female    DOB: 05/11/1971,  MRN: [id number] ? ?Chief Complaint  ?Patient presents with  ? Diabetic Ulcer  ?  Patient presents today with wounds on her left heel and foot x 1 month.  Her leg is red and she says it only hurts in her heel  ? ? ?51 y.o. female presents with the above complaint. History confirmed with patient.  Wounds in the left heel, left dorsal foot and right leg been present for about a month and have not healed appropriately.  She was seeing the wound care center for this.  She has been using Neosporin.  Feels like the foot has been red and increasing into the leg recently.  The heel is still painful.  Her nails are thickened elongated and she also has difficulty cutting them and they cause pain.  She has uncontrolled diabetes with an A1c of about 12% currently.  She says the lowest its ever been is 9%.  She is a 1 pack/day smoker.  She says arterial testing was done on her foot but she does not recall having to see a doctor for it. ? ?Objective:  ?Physical Exam: ?warm, good capillary refill and abnormal sensory exam with loss of protective sensation and diffuse neuropathy, she has nonpalpable pedal pulses with a temperature gradient from warm to cool of the toes.  There are full-thickness ulcerations on the left dorsal foot, left plantar posterior heel and left lateral upper leg.  Exposed subcutaneous tissue.  Periwound erythema.  Serous drainage.  No visibly exposed bone tendon or joint there is bogginess to the left heel and an eschar present on the left foot dorsal ? ? ? ? ? ? ?Assessment:  ? ?1. Cellulitis of left leg   ?2. Ulcer of left foot with fat layer exposed ([place])   ?3. Ulcer of left heel, with fat layer exposed ([place])   ?4. Diabetic ulcer of right lower leg associated with type 2 diabetes mellitus, limited to breakdown of skin ([place])   ?5. Uncontrolled type 2 diabetes mellitus with hyperglycemia ([place])   ?6. Diabetic polyneuropathy associated with  type 2 diabetes mellitus ([place])   ?7. PAD (peripheral artery disease) ([place])   ? ? ? ?Plan:  ?Patient was evaluated and treated and all questions answered. ? ?Ulcer left foot dorsal, left heel, right leg ?-We discussed the etiology and factors that are a part of the wound healing process.  We also discussed the risk of infection both soft tissue and osteomyelitis from open ulceration.  Discussed the risk of limb loss if this happens or worsens. ?-Debridement was not performed today.  I would like to see her noninvasive vascular testing prior to any surgical debridement of the lesions.  Explained the rationale for this with her. ?-Dressed with Silvadene, DSD. ?-Continue home dressing changes daily with gentamicin cream and gauze ?-Recommend offloading the heel while in bed.  She currently has a surgical shoe and I recommend she continue this as well ?-Vascular testing I have ordered vascular ultrasound and ABI and [facility]  ?-HgbA1c: 12% and I discussed the impact of this negatively on wound healing and encouraged her to get this lower closer to 7 or 8%, even 9% would be an improvement from where it is ?-Last antibiotics: She is not currently on them she did have some periwound erythema and drainage today and I recommend we put her on doxycycline ?-Imaging: We will take plain film  radiographs at next visit   ? ? ?Return in about 3 weeks (around 10/04/2021) for wound care.  ? ?

## 2021-09-14 ENCOUNTER — Other Ambulatory Visit (INDEPENDENT_AMBULATORY_CARE_PROVIDER_SITE_OTHER): Payer: Self-pay | Admitting: Physician Assistant

## 2021-09-14 ENCOUNTER — Telehealth: Payer: Self-pay | Admitting: *Deleted

## 2021-09-14 DIAGNOSIS — E13621 Other specified diabetes mellitus with foot ulcer: Secondary | ICD-10-CM

## 2021-09-14 DIAGNOSIS — L89623 Pressure ulcer of left heel, stage 3: Secondary | ICD-10-CM

## 2021-09-14 DIAGNOSIS — L97522 Non-pressure chronic ulcer of other part of left foot with fat layer exposed: Secondary | ICD-10-CM

## 2021-09-14 NOTE — Telephone Encounter (Signed)
Patient is calling because she missed her lab appointment yesterday, scheduled for Vascular study on Thursday, wanted to make sure that it is ok to have it done after that appointment. ?

## 2021-09-16 ENCOUNTER — Ambulatory Visit: Payer: Medicare Other | Admitting: Podiatry

## 2021-09-16 ENCOUNTER — Ambulatory Visit (INDEPENDENT_AMBULATORY_CARE_PROVIDER_SITE_OTHER): Payer: BC Managed Care – PPO

## 2021-09-16 ENCOUNTER — Other Ambulatory Visit: Payer: Self-pay

## 2021-09-16 DIAGNOSIS — L97509 Non-pressure chronic ulcer of other part of unspecified foot with unspecified severity: Secondary | ICD-10-CM | POA: Diagnosis not present

## 2021-09-16 DIAGNOSIS — E13621 Other specified diabetes mellitus with foot ulcer: Secondary | ICD-10-CM

## 2021-09-16 DIAGNOSIS — L03116 Cellulitis of left lower limb: Secondary | ICD-10-CM | POA: Diagnosis not present

## 2021-09-16 DIAGNOSIS — L97522 Non-pressure chronic ulcer of other part of left foot with fat layer exposed: Secondary | ICD-10-CM | POA: Diagnosis not present

## 2021-09-16 DIAGNOSIS — L89623 Pressure ulcer of left heel, stage 3: Secondary | ICD-10-CM | POA: Diagnosis not present

## 2021-09-17 LAB — CBC WITH DIFFERENTIAL/PLATELET
Basophils Absolute: 0.1 10*3/uL (ref 0.0–0.2)
Basos: 1 %
EOS (ABSOLUTE): 0.2 10*3/uL (ref 0.0–0.4)
Eos: 3 %
Hematocrit: 38 % (ref 34.0–46.6)
Hemoglobin: 12.2 g/dL (ref 11.1–15.9)
Immature Grans (Abs): 0 10*3/uL (ref 0.0–0.1)
Immature Granulocytes: 1 %
Lymphocytes Absolute: 0.9 10*3/uL (ref 0.7–3.1)
Lymphs: 10 %
MCH: 27.6 pg (ref 26.6–33.0)
MCHC: 32.1 g/dL (ref 31.5–35.7)
MCV: 86 fL (ref 79–97)
Monocytes Absolute: 0.6 10*3/uL (ref 0.1–0.9)
Monocytes: 7 %
Neutrophils Absolute: 7 10*3/uL (ref 1.4–7.0)
Neutrophils: 78 %
Platelets: 200 10*3/uL (ref 150–450)
RBC: 4.42 x10E6/uL (ref 3.77–5.28)
RDW: 14.4 % (ref 11.7–15.4)
WBC: 8.8 10*3/uL (ref 3.4–10.8)

## 2021-09-17 LAB — SEDIMENTATION RATE: Sed Rate: 34 mm/hr (ref 0–40)

## 2021-09-20 ENCOUNTER — Other Ambulatory Visit: Payer: Self-pay

## 2021-09-20 ENCOUNTER — Emergency Department: Payer: BC Managed Care – PPO

## 2021-09-20 ENCOUNTER — Inpatient Hospital Stay: Payer: BC Managed Care – PPO

## 2021-09-20 ENCOUNTER — Inpatient Hospital Stay
Admission: EM | Admit: 2021-09-20 | Discharge: 2021-09-23 | DRG: 264 | Disposition: A | Discharge status: Home / Self Care | Payer: BC Managed Care – PPO | Attending: Internal Medicine | Admitting: Internal Medicine

## 2021-09-20 DIAGNOSIS — K219 Gastro-esophageal reflux disease without esophagitis: Secondary | ICD-10-CM | POA: Diagnosis present

## 2021-09-20 DIAGNOSIS — Z66 Do not resuscitate: Secondary | ICD-10-CM | POA: Diagnosis present

## 2021-09-20 DIAGNOSIS — L97529 Non-pressure chronic ulcer of other part of left foot with unspecified severity: Secondary | ICD-10-CM | POA: Diagnosis not present

## 2021-09-20 DIAGNOSIS — Z9981 Dependence on supplemental oxygen: Secondary | ICD-10-CM

## 2021-09-20 DIAGNOSIS — Z9011 Acquired absence of right breast and nipple: Secondary | ICD-10-CM

## 2021-09-20 DIAGNOSIS — M778 Other enthesopathies, not elsewhere classified: Secondary | ICD-10-CM | POA: Diagnosis not present

## 2021-09-20 DIAGNOSIS — I739 Peripheral vascular disease, unspecified: Secondary | ICD-10-CM | POA: Diagnosis present

## 2021-09-20 DIAGNOSIS — L03119 Cellulitis of unspecified part of limb: Secondary | ICD-10-CM

## 2021-09-20 DIAGNOSIS — L97512 Non-pressure chronic ulcer of other part of right foot with fat layer exposed: Secondary | ICD-10-CM | POA: Diagnosis not present

## 2021-09-20 DIAGNOSIS — N1831 Chronic kidney disease, stage 3a: Secondary | ICD-10-CM | POA: Diagnosis present

## 2021-09-20 DIAGNOSIS — F17210 Nicotine dependence, cigarettes, uncomplicated: Secondary | ICD-10-CM | POA: Diagnosis present

## 2021-09-20 DIAGNOSIS — F418 Other specified anxiety disorders: Secondary | ICD-10-CM | POA: Diagnosis present

## 2021-09-20 DIAGNOSIS — E11628 Type 2 diabetes mellitus with other skin complications: Secondary | ICD-10-CM | POA: Diagnosis present

## 2021-09-20 DIAGNOSIS — Z8673 Personal history of transient ischemic attack (TIA), and cerebral infarction without residual deficits: Secondary | ICD-10-CM

## 2021-09-20 DIAGNOSIS — Z7982 Long term (current) use of aspirin: Secondary | ICD-10-CM

## 2021-09-20 DIAGNOSIS — I2510 Atherosclerotic heart disease of native coronary artery without angina pectoris: Secondary | ICD-10-CM | POA: Diagnosis present

## 2021-09-20 DIAGNOSIS — Z888 Allergy status to other drugs, medicaments and biological substances status: Secondary | ICD-10-CM

## 2021-09-20 DIAGNOSIS — I69354 Hemiplegia and hemiparesis following cerebral infarction affecting left non-dominant side: Secondary | ICD-10-CM

## 2021-09-20 DIAGNOSIS — L03116 Cellulitis of left lower limb: Secondary | ICD-10-CM | POA: Diagnosis not present

## 2021-09-20 DIAGNOSIS — Z803 Family history of malignant neoplasm of breast: Secondary | ICD-10-CM

## 2021-09-20 DIAGNOSIS — L97919 Non-pressure chronic ulcer of unspecified part of right lower leg with unspecified severity: Secondary | ICD-10-CM | POA: Diagnosis not present

## 2021-09-20 DIAGNOSIS — Z794 Long term (current) use of insulin: Secondary | ICD-10-CM

## 2021-09-20 DIAGNOSIS — E1142 Type 2 diabetes mellitus with diabetic polyneuropathy: Secondary | ICD-10-CM | POA: Diagnosis present

## 2021-09-20 DIAGNOSIS — M65872 Other synovitis and tenosynovitis, left ankle and foot: Secondary | ICD-10-CM | POA: Diagnosis not present

## 2021-09-20 DIAGNOSIS — F319 Bipolar disorder, unspecified: Secondary | ICD-10-CM | POA: Diagnosis present

## 2021-09-20 DIAGNOSIS — I639 Cerebral infarction, unspecified: Secondary | ICD-10-CM | POA: Diagnosis not present

## 2021-09-20 DIAGNOSIS — Z8249 Family history of ischemic heart disease and other diseases of the circulatory system: Secondary | ICD-10-CM

## 2021-09-20 DIAGNOSIS — Z720 Tobacco use: Secondary | ICD-10-CM | POA: Diagnosis present

## 2021-09-20 DIAGNOSIS — Z833 Family history of diabetes mellitus: Secondary | ICD-10-CM

## 2021-09-20 DIAGNOSIS — J9611 Chronic respiratory failure with hypoxia: Secondary | ICD-10-CM | POA: Diagnosis not present

## 2021-09-20 DIAGNOSIS — I5032 Chronic diastolic (congestive) heart failure: Secondary | ICD-10-CM | POA: Diagnosis present

## 2021-09-20 DIAGNOSIS — Z136 Encounter for screening for cardiovascular disorders: Secondary | ICD-10-CM | POA: Diagnosis not present

## 2021-09-20 DIAGNOSIS — F419 Anxiety disorder, unspecified: Secondary | ICD-10-CM | POA: Diagnosis present

## 2021-09-20 DIAGNOSIS — Z91048 Other nonmedicinal substance allergy status: Secondary | ICD-10-CM

## 2021-09-20 DIAGNOSIS — Z743 Need for continuous supervision: Secondary | ICD-10-CM | POA: Diagnosis not present

## 2021-09-20 DIAGNOSIS — C50911 Malignant neoplasm of unspecified site of right female breast: Secondary | ICD-10-CM | POA: Diagnosis present

## 2021-09-20 DIAGNOSIS — E1152 Type 2 diabetes mellitus with diabetic peripheral angiopathy with gangrene: Secondary | ICD-10-CM | POA: Diagnosis not present

## 2021-09-20 DIAGNOSIS — I10 Essential (primary) hypertension: Secondary | ICD-10-CM | POA: Diagnosis present

## 2021-09-20 DIAGNOSIS — Z880 Allergy status to penicillin: Secondary | ICD-10-CM

## 2021-09-20 DIAGNOSIS — L97429 Non-pressure chronic ulcer of left heel and midfoot with unspecified severity: Secondary | ICD-10-CM | POA: Diagnosis not present

## 2021-09-20 DIAGNOSIS — E11621 Type 2 diabetes mellitus with foot ulcer: Secondary | ICD-10-CM | POA: Diagnosis not present

## 2021-09-20 DIAGNOSIS — Z86718 Personal history of other venous thrombosis and embolism: Secondary | ICD-10-CM

## 2021-09-20 DIAGNOSIS — J449 Chronic obstructive pulmonary disease, unspecified: Secondary | ICD-10-CM | POA: Diagnosis present

## 2021-09-20 DIAGNOSIS — G40909 Epilepsy, unspecified, not intractable, without status epilepticus: Secondary | ICD-10-CM | POA: Diagnosis present

## 2021-09-20 DIAGNOSIS — I130 Hypertensive heart and chronic kidney disease with heart failure and stage 1 through stage 4 chronic kidney disease, or unspecified chronic kidney disease: Secondary | ICD-10-CM | POA: Diagnosis present

## 2021-09-20 DIAGNOSIS — I6521 Occlusion and stenosis of right carotid artery: Secondary | ICD-10-CM | POA: Diagnosis not present

## 2021-09-20 DIAGNOSIS — Z8614 Personal history of Methicillin resistant Staphylococcus aureus infection: Secondary | ICD-10-CM

## 2021-09-20 DIAGNOSIS — R600 Localized edema: Secondary | ICD-10-CM | POA: Diagnosis not present

## 2021-09-20 DIAGNOSIS — E1122 Type 2 diabetes mellitus with diabetic chronic kidney disease: Secondary | ICD-10-CM | POA: Diagnosis present

## 2021-09-20 DIAGNOSIS — L97519 Non-pressure chronic ulcer of other part of right foot with unspecified severity: Secondary | ICD-10-CM | POA: Diagnosis not present

## 2021-09-20 DIAGNOSIS — Z7951 Long term (current) use of inhaled steroids: Secondary | ICD-10-CM

## 2021-09-20 DIAGNOSIS — F17200 Nicotine dependence, unspecified, uncomplicated: Secondary | ICD-10-CM | POA: Diagnosis present

## 2021-09-20 DIAGNOSIS — Z885 Allergy status to narcotic agent status: Secondary | ICD-10-CM

## 2021-09-20 DIAGNOSIS — R609 Edema, unspecified: Secondary | ICD-10-CM | POA: Diagnosis not present

## 2021-09-20 DIAGNOSIS — M79673 Pain in unspecified foot: Secondary | ICD-10-CM | POA: Diagnosis not present

## 2021-09-20 DIAGNOSIS — R739 Hyperglycemia, unspecified: Secondary | ICD-10-CM | POA: Diagnosis not present

## 2021-09-20 DIAGNOSIS — Z716 Tobacco abuse counseling: Secondary | ICD-10-CM

## 2021-09-20 DIAGNOSIS — M7989 Other specified soft tissue disorders: Secondary | ICD-10-CM | POA: Diagnosis not present

## 2021-09-20 DIAGNOSIS — Z853 Personal history of malignant neoplasm of breast: Secondary | ICD-10-CM | POA: Diagnosis present

## 2021-09-20 DIAGNOSIS — E119 Type 2 diabetes mellitus without complications: Secondary | ICD-10-CM

## 2021-09-20 DIAGNOSIS — E785 Hyperlipidemia, unspecified: Secondary | ICD-10-CM | POA: Diagnosis present

## 2021-09-20 DIAGNOSIS — Z79891 Long term (current) use of opiate analgesic: Secondary | ICD-10-CM

## 2021-09-20 DIAGNOSIS — L089 Local infection of the skin and subcutaneous tissue, unspecified: Secondary | ICD-10-CM | POA: Diagnosis present

## 2021-09-20 DIAGNOSIS — Z79811 Long term (current) use of aromatase inhibitors: Secondary | ICD-10-CM

## 2021-09-20 DIAGNOSIS — L03115 Cellulitis of right lower limb: Secondary | ICD-10-CM | POA: Diagnosis not present

## 2021-09-20 DIAGNOSIS — Z7984 Long term (current) use of oral hypoglycemic drugs: Secondary | ICD-10-CM

## 2021-09-20 DIAGNOSIS — E1165 Type 2 diabetes mellitus with hyperglycemia: Secondary | ICD-10-CM | POA: Diagnosis present

## 2021-09-20 DIAGNOSIS — L97522 Non-pressure chronic ulcer of other part of left foot with fat layer exposed: Secondary | ICD-10-CM | POA: Diagnosis not present

## 2021-09-20 DIAGNOSIS — Z20822 Contact with and (suspected) exposure to covid-19: Secondary | ICD-10-CM | POA: Diagnosis not present

## 2021-09-20 DIAGNOSIS — Z881 Allergy status to other antibiotic agents status: Secondary | ICD-10-CM

## 2021-09-20 DIAGNOSIS — Z79899 Other long term (current) drug therapy: Secondary | ICD-10-CM

## 2021-09-20 DIAGNOSIS — C50919 Malignant neoplasm of unspecified site of unspecified female breast: Secondary | ICD-10-CM | POA: Diagnosis present

## 2021-09-20 DIAGNOSIS — J4489 Other specified chronic obstructive pulmonary disease: Secondary | ICD-10-CM | POA: Diagnosis present

## 2021-09-20 DIAGNOSIS — I70262 Atherosclerosis of native arteries of extremities with gangrene, left leg: Secondary | ICD-10-CM | POA: Diagnosis not present

## 2021-09-20 HISTORY — DX: Cellulitis of unspecified part of limb: L03.119

## 2021-09-20 LAB — C-REACTIVE PROTEIN: CRP: 5.3 mg/dL — ABNORMAL HIGH (ref <1.0)

## 2021-09-20 LAB — CBC WITH DIFFERENTIAL/PLATELET
Abs Immature Granulocytes: 0.06 10*3/uL (ref 0.00–0.07)
Basophils Absolute: 0.1 10*3/uL (ref 0.0–0.1)
Basophils Relative: 1 %
Eosinophils Absolute: 0.2 10*3/uL (ref 0.0–0.5)
Eosinophils Relative: 3 %
HCT: 39.1 % (ref 36.0–46.0)
Hemoglobin: 11.9 g/dL — ABNORMAL LOW (ref 12.0–15.0)
Immature Granulocytes: 1 %
Lymphocytes Relative: 14 %
Lymphs Abs: 1.2 10*3/uL (ref 0.7–4.0)
MCH: 26.4 pg (ref 26.0–34.0)
MCHC: 30.4 g/dL (ref 30.0–36.0)
MCV: 86.9 fL (ref 80.0–100.0)
Monocytes Absolute: 0.5 10*3/uL (ref 0.1–1.0)
Monocytes Relative: 6 %
Neutro Abs: 6.4 10*3/uL (ref 1.7–7.7)
Neutrophils Relative %: 75 %
Platelets: 219 10*3/uL (ref 150–400)
RBC: 4.5 MIL/uL (ref 3.87–5.11)
RDW: 14.8 % (ref 11.5–15.5)
WBC: 8.5 10*3/uL (ref 4.0–10.5)
nRBC: 0 % (ref 0.0–0.2)

## 2021-09-20 LAB — COMPREHENSIVE METABOLIC PANEL
ALT: 14 U/L (ref 0–44)
AST: 13 U/L — ABNORMAL LOW (ref 15–41)
Albumin: 3.6 g/dL (ref 3.5–5.0)
Alkaline Phosphatase: 125 U/L (ref 38–126)
Anion gap: 8 (ref 5–15)
BUN: 29 mg/dL — ABNORMAL HIGH (ref 6–20)
CO2: 28 mmol/L (ref 22–32)
Calcium: 9.1 mg/dL (ref 8.9–10.3)
Chloride: 97 mmol/L — ABNORMAL LOW (ref 98–111)
Creatinine, Ser: 1.34 mg/dL — ABNORMAL HIGH (ref 0.44–1.00)
GFR, Estimated: 48 mL/min — ABNORMAL LOW (ref >60)
Glucose, Bld: 377 mg/dL — ABNORMAL HIGH (ref 70–99)
Potassium: 3.7 mmol/L (ref 3.5–5.1)
Sodium: 133 mmol/L — ABNORMAL LOW (ref 135–145)
Total Bilirubin: 1 mg/dL (ref 0.3–1.2)
Total Protein: 9.3 g/dL — ABNORMAL HIGH (ref 6.5–8.1)

## 2021-09-20 LAB — APTT: aPTT: 38 seconds — ABNORMAL HIGH (ref 24–36)

## 2021-09-20 LAB — CBG MONITORING, ED: Glucose-Capillary: 293 mg/dL — ABNORMAL HIGH (ref 70–99)

## 2021-09-20 LAB — PROTIME-INR
INR: 1.2 (ref 0.8–1.2)
Prothrombin Time: 15.3 seconds — ABNORMAL HIGH (ref 11.4–15.2)

## 2021-09-20 LAB — RESP PANEL BY RT-PCR (FLU A&B, COVID) ARPGX2
Influenza A by PCR: NEGATIVE
Influenza B by PCR: NEGATIVE
SARS Coronavirus 2 by RT PCR: NEGATIVE

## 2021-09-20 LAB — PREGNANCY, URINE: Preg Test, Ur: NEGATIVE

## 2021-09-20 LAB — GLUCOSE, CAPILLARY: Glucose-Capillary: 277 mg/dL — ABNORMAL HIGH (ref 70–99)

## 2021-09-20 LAB — LACTIC ACID, PLASMA: Lactic Acid, Venous: 1.2 mmol/L (ref 0.5–1.9)

## 2021-09-20 LAB — BRAIN NATRIURETIC PEPTIDE: B Natriuretic Peptide: 88 pg/mL (ref 0.0–100.0)

## 2021-09-20 LAB — SEDIMENTATION RATE: Sed Rate: 67 mm/hr — ABNORMAL HIGH (ref 0–30)

## 2021-09-20 MED ORDER — DM-GUAIFENESIN ER 30-600 MG PO TB12
1.0000 | ORAL_TABLET | Freq: Two times a day (BID) | ORAL | Status: DC | PRN
Start: 2021-09-20 — End: 2021-09-23
  Administered 2021-09-21: 1 via ORAL
  Filled 2021-09-20 (×2): qty 1

## 2021-09-20 MED ORDER — MOMETASONE FURO-FORMOTEROL FUM 100-5 MCG/ACT IN AERO
2.0000 | INHALATION_SPRAY | Freq: Two times a day (BID) | RESPIRATORY_TRACT | Status: DC
  Administered 2021-09-20 – 2021-09-23 (×5): 2 via RESPIRATORY_TRACT
  Filled 2021-09-20 (×2): qty 8.8

## 2021-09-20 MED ORDER — ROPINIROLE HCL 1 MG PO TABS
1.0000 mg | ORAL_TABLET | Freq: Every day | ORAL | Status: DC
  Administered 2021-09-20 – 2021-09-22 (×3): 1 mg via ORAL
  Filled 2021-09-20 (×3): qty 1

## 2021-09-20 MED ORDER — CITALOPRAM HYDROBROMIDE 10 MG PO TABS
40.0000 mg | ORAL_TABLET | Freq: Every day | ORAL | Status: DC
  Administered 2021-09-21 – 2021-09-23 (×3): 40 mg via ORAL
  Filled 2021-09-20: qty 2
  Filled 2021-09-20 (×2): qty 4
  Filled 2021-09-20: qty 2

## 2021-09-20 MED ORDER — ROSUVASTATIN CALCIUM 10 MG PO TABS
20.0000 mg | ORAL_TABLET | Freq: Every day | ORAL | Status: DC
  Administered 2021-09-20 – 2021-09-23 (×4): 20 mg via ORAL
  Filled 2021-09-20 (×2): qty 2
  Filled 2021-09-20 (×2): qty 1

## 2021-09-20 MED ORDER — GENTAMICIN SULFATE 0.1 % EX CREA
1.0000 "application " | TOPICAL_CREAM | Freq: Every day | CUTANEOUS | Status: DC
  Administered 2021-09-21 – 2021-09-22 (×2): 1 via TOPICAL
  Filled 2021-09-20: qty 15

## 2021-09-20 MED ORDER — NICOTINE 21 MG/24HR TD PT24
21.0000 mg | MEDICATED_PATCH | Freq: Every day | TRANSDERMAL | Status: DC
  Administered 2021-09-20 – 2021-09-23 (×4): 21 mg via TRANSDERMAL
  Filled 2021-09-20 (×4): qty 1

## 2021-09-20 MED ORDER — INSULIN ASPART 100 UNIT/ML IJ SOLN
0.0000 [IU] | Freq: Three times a day (TID) | INTRAMUSCULAR | Status: DC
  Administered 2021-09-20: 5 [IU] via SUBCUTANEOUS
  Administered 2021-09-21 – 2021-09-23 (×7): 3 [IU] via SUBCUTANEOUS
  Filled 2021-09-20 (×8): qty 1

## 2021-09-20 MED ORDER — PREGABALIN 75 MG PO CAPS
300.0000 mg | ORAL_CAPSULE | Freq: Two times a day (BID) | ORAL | Status: DC
  Administered 2021-09-20 – 2021-09-23 (×6): 300 mg via ORAL
  Filled 2021-09-20 (×6): qty 4

## 2021-09-20 MED ORDER — TRAZODONE HCL 100 MG PO TABS
200.0000 mg | ORAL_TABLET | Freq: Every day | ORAL | Status: DC
  Administered 2021-09-20 – 2021-09-22 (×3): 200 mg via ORAL
  Filled 2021-09-20 (×3): qty 2

## 2021-09-20 MED ORDER — ALPRAZOLAM 1 MG PO TABS
1.0000 mg | ORAL_TABLET | Freq: Four times a day (QID) | ORAL | Status: DC | PRN
  Administered 2021-09-20 – 2021-09-22 (×5): 1 mg via ORAL
  Filled 2021-09-20: qty 1
  Filled 2021-09-20: qty 2
  Filled 2021-09-20 (×2): qty 1
  Filled 2021-09-20: qty 2

## 2021-09-20 MED ORDER — VENLAFAXINE HCL ER 150 MG PO CP24
150.0000 mg | ORAL_CAPSULE | Freq: Every day | ORAL | Status: DC
  Administered 2021-09-21 – 2021-09-23 (×3): 150 mg via ORAL
  Filled 2021-09-20 (×3): qty 1

## 2021-09-20 MED ORDER — INSULIN GLARGINE-YFGN 100 UNIT/ML ~~LOC~~ SOLN
10.0000 [IU] | Freq: Every day | SUBCUTANEOUS | Status: DC
  Administered 2021-09-20 – 2021-09-21 (×2): 10 [IU] via SUBCUTANEOUS
  Filled 2021-09-20 (×2): qty 0.1

## 2021-09-20 MED ORDER — ACETAMINOPHEN 325 MG PO TABS: 650.0000 mg | ORAL_TABLET | Freq: Four times a day (QID) | ORAL | Status: DC | PRN

## 2021-09-20 MED ORDER — MUPIROCIN 2 % EX OINT
1.0000 "application " | TOPICAL_OINTMENT | Freq: Two times a day (BID) | CUTANEOUS | Status: DC
  Administered 2021-09-20 – 2021-09-21 (×3): 1 via TOPICAL
  Filled 2021-09-20: qty 22

## 2021-09-20 MED ORDER — BUSPIRONE HCL 10 MG PO TABS
30.0000 mg | ORAL_TABLET | Freq: Two times a day (BID) | ORAL | Status: DC
  Administered 2021-09-20 – 2021-09-23 (×6): 30 mg via ORAL
  Filled 2021-09-20: qty 3
  Filled 2021-09-20 (×3): qty 6
  Filled 2021-09-20 (×4): qty 3

## 2021-09-20 MED ORDER — INSULIN ASPART 100 UNIT/ML IJ SOLN
0.0000 [IU] | Freq: Every day | INTRAMUSCULAR | Status: DC
  Administered 2021-09-20: 22:00:00 3 [IU] via SUBCUTANEOUS
  Administered 2021-09-21: 2 [IU] via SUBCUTANEOUS
  Administered 2021-09-22: 3 [IU] via SUBCUTANEOUS
  Filled 2021-09-20 (×3): qty 1

## 2021-09-20 MED ORDER — OXYCODONE-ACETAMINOPHEN 5-325 MG PO TABS
1.0000 | ORAL_TABLET | ORAL | Status: DC | PRN
Start: 2021-09-20 — End: 2021-09-23
  Administered 2021-09-20 – 2021-09-23 (×10): 1 via ORAL
  Filled 2021-09-20 (×10): qty 1

## 2021-09-20 MED ORDER — ANASTROZOLE 1 MG PO TABS
1.0000 mg | ORAL_TABLET | Freq: Every day | ORAL | Status: DC
Start: 2021-09-20 — End: 2021-09-23
  Administered 2021-09-21 – 2021-09-23 (×3): 1 mg via ORAL
  Filled 2021-09-20 (×4): qty 1

## 2021-09-20 MED ORDER — ALBUTEROL SULFATE (2.5 MG/3ML) 0.083% IN NEBU: 2.5000 mg | INHALATION_SOLUTION | RESPIRATORY_TRACT | Status: DC | PRN

## 2021-09-20 MED ORDER — ASPIRIN EC 81 MG PO TBEC
81.0000 mg | DELAYED_RELEASE_TABLET | Freq: Every day | ORAL | Status: DC
  Administered 2021-09-22 – 2021-09-23 (×2): 81 mg via ORAL
  Filled 2021-09-20 (×3): qty 1

## 2021-09-20 MED ORDER — HEPARIN SODIUM (PORCINE) 5000 UNIT/ML IJ SOLN
5000.0000 [IU] | Freq: Three times a day (TID) | INTRAMUSCULAR | Status: DC
  Administered 2021-09-20 – 2021-09-23 (×3): 5000 [IU] via SUBCUTANEOUS
  Filled 2021-09-20 (×4): qty 1

## 2021-09-20 MED ORDER — ONDANSETRON HCL 4 MG/2ML IJ SOLN
4.0000 mg | Freq: Three times a day (TID) | INTRAMUSCULAR | Status: DC | PRN
Start: 2021-09-20 — End: 2021-09-22

## 2021-09-20 MED ORDER — VANCOMYCIN HCL IN DEXTROSE 1-5 GM/200ML-% IV SOLN
1000.0000 mg | Freq: Once | INTRAVENOUS | Status: AC
Start: 2021-09-20 — End: 2021-09-20
  Administered 2021-09-20: 1000 mg via INTRAVENOUS
  Filled 2021-09-20: qty 200

## 2021-09-20 MED ORDER — HYDRALAZINE HCL 20 MG/ML IJ SOLN: 5.0000 mg | INTRAMUSCULAR | Status: DC | PRN

## 2021-09-20 NOTE — H&P (View-Only) (Signed)
Reason for Consult:Heel Ulcer ?Referring Physician: Dr. Ivor Costa, MD ? ?[name] is an 51 y.o. female.  ?HPI: [name] is a 51 y.o. female with medical history significant of PVD, hypertension, hyperlipidemia, diabetes mellitus, COPD, asthma, stroke, GERD, depression with anxiety, tobacco abuse, seizure, anemia, CAD, CKD-3A, dCHF, right breast cancer (s/p of mastectomy), who presents with left foot ulcer with infection and bilateral lower leg pain. ? ?Previously she was seen at the wound care center.  She has last seen Dr. Sherryle Lis in the office on September 13, 2021.  At that time arterial studies were ordered which she has had completed.  She presents today for worsening of the infection, wounds. ? ?Past Medical History:  ?Diagnosis Date  ? Anxiety   ? takes Prozac daily  ? Anxiety   ? Aortic valve calcification   ? Asthma   ? Advair and Spirva daily  ? Asthma   ? Bipolar disorder ([place])   ? Breast cancer, right breast ([place]) 12/23/2019  ? CAD (coronary artery disease)   ? a. LHC 11/2013 done for CP/fluid retention: mild disease in prox LAD, mild-mod disease in mRCA, EF 60% with normal LVEDP. b. Normal nuc 03/2016.  ? Cancer [facility])   ? Cellulitis and abscess of trunk 03/12/2020  ? CHF (congestive heart failure) ([place])   ? Chronic diastolic CHF (congestive heart failure) ([place])   ? Chronic heart failure with preserved ejection fraction ([place]) 11/16/2018  ? CKD (chronic kidney disease), stage II   ? COPD (chronic obstructive pulmonary disease) ([place])   ? a. nocturnal O2.  ? COPD (chronic obstructive pulmonary disease) ([place])   ? Coronary artery disease   ? Decreased urine stream   ? Diabetes mellitus   ? Diabetes mellitus without complication ([place])   ? Dyspnea   ? Elevated C-reactive protein (CRP) 01/29/2018  ? Elevated sedimentation rate 01/29/2018  ? Elevated serum hCG 01/19/2018  ? Family history of adverse reaction to anesthesia   ? mom gets nauseated  ? Foot pain, bilateral 05/22/2018  ? GERD (gastroesophageal reflux  disease)   ? takes Pepcid daily  ? History of blood clots   ? left leg 3-72yr ago  ? Hyperlipidemia   ? Hypertension   ? Hypertriglyceridemia   ? Inguinal hernia, left 01/2015  ? Muscle spasm   ? Open wound of genital labia   ? Peripheral neuropathy   ? RBBB   ? Seizures ([place]   ? Sepsis ([place] 01/19/2018  ? Sinus tachycardia   ? a. persistent since 2009.  ? Smokers' cough ([place]   ? Status post mastectomy, right 01/14/2020  ? Stroke ([facility]  ? left sided weakness  ? TIA (transient ischemic attack)   ? Tobacco abuse   ? Vulvar abscess 01/23/2018  ? ? ?Past Surgical History:  ?Procedure Laterality Date  ? APPLICATION OF WOUND VAC Right 01/29/2020  ? Procedure: APPLICATION OF WOUND VAC;  Surgeon: RRonny Bacon MD;  Location: [facility];  Service: General;  Laterality: Right;  ? BREAST BIOPSY Right 11/06/2019  ? uKore[place]ore path pending venus clip  ? HERNIA REPAIR Left   ? INCISION AND DRAINAGE ABSCESS N/A 01/26/2018  ? Procedure: INCISION AND DEBRIDEMENT OF VULVAR NECROTIZING SOFT TISSUE INFECTION;  Surgeon: WGreer Pickerel MD;  [place]  Service: General;  Laterality: N/A;  ? INCISION AND DRAINAGE ABSCESS N/A 07/21/2021  ? Procedure: INCISION AND DRAINAGE ABSCESS Scalp;  Surgeon: BRobert Bellow MD;  Location: [facility];  Service: General;  Laterality: N/A;  scalp and right abdomen  ? INCISION AND DRAINAGE PERIRECTAL ABSCESS N/A 01/22/2018  ? Procedure: IRRIGATION AND DEBRIDEMENT LABIAL/VULVAR AREA;  Surgeon: Coralie Keens, MD;  Location: [place];  Service: General;  Laterality: N/A;  ? [place] N/A 01/29/2018  ? Procedure: IRRIGATION AND DEBRIDEMENT VULVA;  Surgeon: Excell [place], MD;  Location: [place];  Service: General;  Laterality: N/A;  ? INGUINAL HERNIA REPAIR Left 04/08/2015  ? Procedure: OPEN LEFT INGUINAL HERNIA REPAIR WITH MESH;  Surgeon: Ralene Ok, MD;  Location: [place];  Service: General;  Laterality: Left;  ? INSERTION OF MESH Left 04/08/2015  ? Procedure:  INSERTION OF MESH;  Surgeon: Ralene Ok, MD;  Location: [place];  Service: General;  Laterality: Left;  ? IRRIGATION AND DEBRIDEMENT ABSCESS N/A 07/21/2021  ? Procedure: IRRIGATION AND DEBRIDEMENT ABSCESS abdomen;  Surgeon: Robert Bellow, MD;  Location: [facility];  Service: General;  Laterality: N/A;  ? LAPAROSCOPY    ? Endometriosis  ? LEFT HEART CATHETERIZATION WITH CORONARY ANGIOGRAM N/A 12/05/2013  ? Procedure: LEFT HEART CATHETERIZATION WITH CORONARY ANGIOGRAM;  Surgeon: Jettie Booze, MD;  Location: [facility] C[facility];  Service: Cardiovascular;  Laterality: N/A;  ? MASTECTOMY Right   ? right kidney drained    ? SIMPLE MASTECTOMY WITH AXILLARY SENTINEL NODE BIOPSY Right 12/23/2019  ? Procedure: Right SIMPLE MASTECTOMY WITH AXILLARY SENTINEL NODE BIOPSY;  Surgeon: Ronny Bacon, MD;  Location: [facility];  Service: General;  Laterality: Right;  ? TEE WITHOUT CARDIOVERSION N/A 11/28/2013  ? Procedure: TRANSESOPHAGEAL ECHOCARDIOGRAM (TEE);  Surgeon: Thayer Headings, MD;  Location: [place];  Service: Cardiovascular;  Laterality: N/A;  ? WOUND DEBRIDEMENT Right 01/29/2020  ? Procedure: DEBRIDEMENT WOUND, Excisional debridement skin, subcutaneous and muscle right chest wall;  Surgeon: Ronny Bacon, MD;  Location: [facility];  Service: General;  Laterality: Right;  ? ? ?Family History  ?Problem Relation Age of Onset  ? Venous thrombosis Brother   ? Other Brother   ?     BRAIN TUMOR  ? Asthma Father   ? Diabetes Father   ? Coronary artery disease Mother   ? Hypertension Mother   ? Diabetes Mother   ? Breast cancer Mother 30  ? Asthma Sister   ? Diabetes type II Brother   ? Diabetes Brother   ? ? ?Social History:  reports that she has been smoking cigarettes. She started smoking about 40 years ago. She has a 9.00 pack-year smoking history. She has never used smokeless tobacco. She reports that she does not drink alcohol and does not use drugs. ? ?Allergies:  ?Allergies  ?Allergen Reactions  ? Adhesive [Tape]  Rash and Other (See Comments)  ?  TAKES OFF THE SKIN (CERTAIN MEDICAL TAPES DO THIS!!)  ? Metoprolol Shortness Of Breath  ?  Occurrence of shortness of breath after 3 days  ? Montelukast Shortness Of Breath  ? Morphine Sulfate Anaphylaxis, Shortness Of Breath and Nausea And Vomiting  ?  Swollen Throat - Able to tolerate dilaudid  ? Penicillins Anaphylaxis, Hives and Shortness Of Breath  ?  Throat swells ?Has patient had a PCN reaction causing immediate rash, facial/tongue/throat swelling, SOB or lightheadedness with hypotension: Yes ?Has patient had a PCN reaction causing severe rash involving mucus membranes or skin necrosis: No ?Has patient had a PCN reaction that required hospitalization: Yes ?Has patient had a PCN reaction occurring within the last 10 years: No ?If all of the above answers are "NO", then may  proceed with Cephalosporin use. ?  ? Silicone Rash  ?  Takes off the skin (certain medical tapes do this)  ? Diltiazem Swelling  ? Doxycycline Hives  ? Gabapentin Swelling  ? Midodrine   ?  Lightheaded and falling down  ? ? ?Medications: I have reviewed the patient's current medications. ? ?Results for orders placed or performed during the hospital encounter of 09/20/21 (from the past 48 hour(s))  ?Pregnancy, urine     Status: None  ? Collection Time: 09/20/21  2:17 PM  ?Result Value Ref Range  ? Preg Test, Ur NEGATIVE NEGATIVE  ?  Comment: Performed at [facility], [address] [place], [place] [address]  ?CBC with Differential     Status: Abnormal  ? Collection Time: 09/20/21  2:26 PM  ?Result Value Ref Range  ? WBC 8.5 4.0 - 10.5 K/uL  ? RBC 4.50 3.87 - 5.11 MIL/uL  ? Hemoglobin 11.9 (L) 12.0 - 15.0 g/dL  ? HCT 39.1 36.0 - 46.0 %  ? MCV 86.9 80.0 - 100.0 fL  ? MCH 26.4 26.0 - 34.0 pg  ? MCHC 30.4 30.0 - 36.0 g/dL  ? RDW 14.8 11.5 - 15.5 %  ? Platelets 219 150 - 400 K/uL  ? nRBC 0.0 0.0 - 0.2 %  ? Neutrophils Relative % 75 %  ? Neutro Abs 6.4 1.7 - 7.7 K/uL  ? Lymphocytes Relative 14 %  ?  Lymphs Abs 1.2 0.7 - 4.0 K/uL  ? Monocytes Relative 6 %  ? Monocytes Absolute 0.5 0.1 - 1.0 K/uL  ? Eosinophils Relative 3 %  ? Eosinophils Absolute 0.2 0.0 - 0.5 K/uL  ? Basophils Relative 1 %  ? Basophils Absolute 0.

## 2021-09-20 NOTE — Consult Note (Signed)
WOC Nurse Consult Note: ?Reason for Consult: Left heel, left dorsal foot and right LE full thickness wounds in patient with uncontrolled diabetes and tobacco use. Seen by Podiatric medicine (Dr. Sherryle Lis) on 09/13/21. It is noted that [place] is simultaneously consulted with Vascular Surgery and they have not yet seen. WOC Nursing will defer to Vascular surgery; any orders placed by Vascular surgery will supercede those entered by [facility] Nurse. ?These wounds exceed the scope of [place]. ?Wound type: Neuropathic, infectious, PAD ?Pressure Injury POA: N/A ?Measurement: ?Left dorsal foot: 3cmn x 3cm with depth obscured by the presence of nonviable tissue ?Left heel: approximately 6 x 9cm area of nonviable tissue with peeling skin at periphery, maceration vs subcutaneous infection. Small serous drainage. ?Right LE: 6cm x 6cm area of erythema with full thickness skin loss in center and dried serum (scab) vs. Eschar. Scant exudate, serous ?Wound bed: As noted above ?Drainage (amount, consistency, odor) As noted above ?Periwound: As noted above ?Dressing procedure/placement/frequency: I have provided Nursing with guidance for the topical care of these wounds using the POC implemented by Podiatry on 3/6 and ordered today by Dr. Blaine Hamper for gentamycin cream applied daily. Floatation of the heels is recommended. ? ?[place] nursing team will not follow, but will remain available to this patient, the nursing and medical teams.  Please re-consult if needed. ?Thanks, ?Maudie Flakes, MSN, RN, [place], [place], CWON-AP, [place]  ?Pager# [phone]  ? ? ? ? ? ?  ?

## 2021-09-20 NOTE — ED Provider Notes (Signed)
Reviewed clinical history and consultation for admission with Dr. Blaine Hamper.  Dr. Blaine Hamper accepting of admission ?  ?Delman Kitten, MD ?09/20/21 1558 ? ?

## 2021-09-20 NOTE — ED Notes (Signed)
Urine specimen sent to lab

## 2021-09-20 NOTE — ED Provider Notes (Signed)
? ?[facility] ?Provider Note ? ? ? Event Date/Time  ? First MD Initiated Contact with Patient 09/20/21 1420   ?  (approximate) ? ?History  ? ?Chief Complaint: Leg Swelling ? ?HPI ? ?[name] is a 51 y.o. female with a past medical history of anxiety, CHF, CKD, diabetes, presents to the emergency department for increased leg swelling and redness.  Cording to the patient she is currently being treated by [facility] for an infection to the heel of the left foot, top of the left foot and to the right lower leg.  Patient states she has gone through multiple rounds of antibiotics currently on doxycycline (despite an allergy to this medication per patient).  States the left leg has become more red and swollen with increasing redness up to the knee.  Patient was concerned so she came to the emergency department for evaluation.  Patient denies any fever. ? ?Physical Exam  ? ?Triage Vital Signs: ?ED Triage Vitals  ?Enc Vitals Group  ?   BP 09/20/21 1412 123/85  ?   Pulse Rate 09/20/21 1412 78  ?   Resp 09/20/21 1412 19  ?   Temp 09/20/21 1412 98.4 ?F (36.9 ?C)  ?   Temp Source 09/20/21 1412 Oral  ?   SpO2 09/20/21 1412 94 %  ?   Weight 09/20/21 1409 230 lb (104.3 kg)  ?   Height 09/20/21 1409 '5\' 4"'$  (1.626 m)  ?   Head Circumference --   ?   Peak Flow --   ?   Pain Score 09/20/21 1409 8  ?   Pain Loc --   ?   Pain Edu? --   ?   Excl. in [place]? --   ? ? ?Most recent vital signs: ?Vitals:  ? 09/20/21 1412  ?BP: 123/85  ?Pulse: 78  ?Resp: 19  ?Temp: 98.4 ?F (36.9 ?C)  ?SpO2: 94%  ? ? ?General: Awake, no distress.  ?CV:  Good peripheral perfusion.  Regular rate and rhythm  ?Resp:  Normal effort.  Equal breath sounds bilaterally.  ?Abd:  No distention.  Soft, nontender.  No rebound or guarding. ?Other:  Patient does have a chronic appearing ulceration to the heel of her left foot as well as the dorsal aspect of the left foot.  The 1 on the heel appears somewhat erythematous around the margins  consistent with possible infection.  Patient also has an area approximately 4 cm in diameter of redness and likely infection to the right lateral leg. ? ? ?ED Results / Procedures / Treatments  ? ?RADIOLOGY ? ?Ultrasound pending ?X-ray pending ? ? ?MEDICATIONS ORDERED IN ED: ?Medications - No data to display ? ? ?IMPRESSION / MDM / ASSESSMENT AND PLAN / ED COURSE  ?I reviewed the triage vital signs and the nursing notes. ? ?Patient presents emergency department for left leg increased redness and swelling.  Patient does have ulcerations to the left foot we will obtain an x-ray to rule out osteomyelitis.  Patient does have an area of infection to her right lateral leg.  Patient is currently on antibiotics.  Reassuringly her lactic acid is 1.2, chemistry shows no significant abnormalities besides hyperglycemia we will dose IV fluids and IV insulin.  Patient's CBC is normal including a normal white blood cell count. ? ?Currently pending an ultrasound to rule out DVT in the left lower extremity as well as a left foot x-ray to evaluate for osteomyelitis.  Patient is receiving IV antibiotics  we have sent a set of blood cultures.   ? ?Patient care signed out to oncoming provider.  Ultrasound and x-ray pending.  Patient may require admission versus discharge home. ?FINAL CLINICAL IMPRESSION(S) / ED DIAGNOSES  ? ?Cellulitis ? ? ?Note:  This document was prepared using Dragon voice recognition software and may include unintentional dictation errors. ?  ?Harvest Dark, MD ?09/20/21 1512 ? ?

## 2021-09-20 NOTE — Progress Notes (Signed)
[name] is a 51 year old female that presents today for worsening of wounds on her left heel and the dorsal surface of her left foot.  There is also an ulceration noted on her proximal right lower extremity at the lateral edge below the knee.  The patient has a history of tobacco usage with uncontrolled diabetes mellitus.  It is also notable for hypertension, hyperlipidemia, stroke, and COPD.  The patient had an ABI study only ordered by her podiatrist on 09/16/2021 which revealed normal ABIs with triphasic waveforms.  She was also negative for any DVTs.  Her presentation today is concerning for worsening infection and cellulitis.  The patient has MRI is ordered to evaluate for possible osteomyelitis with the possibility of debridement of the wound by podiatry.  Lower extremity angiogram may be beneficial despite normal studies, depending on results of MRI.  Full consult to follow. ?

## 2021-09-20 NOTE — ED Triage Notes (Signed)
Pt arrived [facility]. Pt is being seen by [facility] for abscess on both legs/feet and cellulitis. Pt lower extr. Red, swollen and hot to touch.  ?

## 2021-09-20 NOTE — H&P (Signed)
History and Physical    [name] DIY:[id number] DOB: 1970-12-26 DOA: 09/20/2021  Referring MD/NP/PA:   PCP: Pleas Koch, NP   Patient coming from:  The patient is coming from home.  At baseline, pt is independent for most of ADL.        Chief Complaint: Left foot ulcer with infection, bilateral lower leg pain  HPI: [name] is a 51 y.o. female with medical history significant of PVD, hypertension, hyperlipidemia, diabetes mellitus, COPD, asthma, stroke, GERD, depression with anxiety, tobacco abuse, seizure, anemia, CAD, CKD-3A, dCHF, right breast cancer (s/p of mastectomy), who presents with left foot ulcer with infection and bilateral lower leg pain.  Patient states that she has chronic left foot heel ulcer with infection. She has been following up with the podiatrist.  She was treated with multiple rounds of antibiotics, currently on doxycycline (despite an allergy to this medication per patient).  Her foot ulcer and infection have not improved.  She also has bilateral lower leg swelling, erythema and pain.  She states that she has some pus drainage from left heel ulcer.  She states that her pain is constant, moderate, sharp, nonradiating.  No fever or chills.  Patient denies chest pain, cough, shortness breath.  She has nausea, no vomiting, diarrhea or abdominal pain.  No symptoms of UTI.  Patient is allergic to morphine causing anaphylaxis reaction.  She states that she can take IV Dilaudid, Percocet and Tylenol.  Data Reviewed and ED Course: pt was found to have WBC 8.5, lactic acid 1.2, slightly worsening renal function, temperature normal, blood pressure 123/85, heart rate 78, RR 19, oxygen saturation 94% on room air.  X-ray of left foot is negative for osteomyelitis, but highly suspecting osteomyelitis.  Left leg venous Dopplers negative for DVT.  Pending right leg venous Doppler.  Patient is admitted to [place] bed as inpatient.  Dr. Jacqualyn Posey of podiatry and Dr. Delana Meyer of  vascular surgery are consulted.  EKG:  Not done in ED, will get one.  Review of Systems:   General: no fevers, chills, no body weight gain, has poor appetite, has fatigue HEENT: no blurry vision, hearing changes or sore throat Respiratory: no dyspnea, coughing, wheezing CV: no chest pain, no palpitations GI: has nausea, no vomiting, abdominal pain, diarrhea, constipation GU: no dysuria, burning on urination, increased urinary frequency, hematuria  Ext: has leg edema Neuro: no unilateral weakness, numbness, or tingling, no vision change or hearing loss Skin: has foot ulcers. MSK: No muscle spasm, no deformity, no limitation of range of movement in spin Heme: No easy bruising.  Travel history: No recent long distant travel.   Allergy:  Allergies  Allergen Reactions   Adhesive [Tape] Rash and Other (See Comments)    TAKES OFF THE SKIN (CERTAIN MEDICAL TAPES DO THIS!!)   Metoprolol Shortness Of Breath    Occurrence of shortness of breath after 3 days   Montelukast Shortness Of Breath   Morphine Sulfate Anaphylaxis, Shortness Of Breath and Nausea And Vomiting    Swollen Throat - Able to tolerate dilaudid   Penicillins Anaphylaxis, Hives and Shortness Of Breath    Throat swells Has patient had a PCN reaction causing immediate rash, facial/tongue/throat swelling, SOB or lightheadedness with hypotension: Yes Has patient had a PCN reaction causing severe rash involving mucus membranes or skin necrosis: No Has patient had a PCN reaction that required hospitalization: Yes Has patient had a PCN reaction occurring within the last 10 years: No If all of the above  answers are "NO", then may proceed with Cephalosporin use.    Silicone Rash    Takes off the skin (certain medical tapes do this)   Diltiazem Swelling   Doxycycline Hives   Gabapentin Swelling   Midodrine     Lightheaded and falling down    Past Medical History:  Diagnosis Date   Anxiety    takes Prozac daily   Anxiety     Aortic valve calcification    Asthma    Advair and Spirva daily   Asthma    Bipolar disorder (HCC)    Breast cancer, right breast ([place]) 12/23/2019   CAD (coronary artery disease)    a. LHC 11/2013 done for CP/fluid retention: mild disease in prox LAD, mild-mod disease in mRCA, EF 60% with normal LVEDP. b. Normal nuc 03/2016.   Cancer ([place])    Cellulitis and abscess of trunk 03/12/2020   CHF (congestive heart failure) (HCC)    Chronic diastolic CHF (congestive heart failure) (HCC)    Chronic heart failure with preserved ejection fraction ([place]) 11/16/2018   CKD (chronic kidney disease), stage II    COPD (chronic obstructive pulmonary disease) (HCC)    a. nocturnal O2.   COPD (chronic obstructive pulmonary disease) (HCC)    Coronary artery disease    Decreased urine stream    Diabetes mellitus    Diabetes mellitus without complication (HCC)    Dyspnea    Elevated C-reactive protein (CRP) 01/29/2018   Elevated sedimentation rate 01/29/2018   Elevated serum hCG 01/19/2018   Family history of adverse reaction to anesthesia    mom gets nauseated   Foot pain, bilateral 05/22/2018   GERD (gastroesophageal reflux disease)    takes Pepcid daily   History of blood clots    left leg 3-70yr ago   Hyperlipidemia    Hypertension    Hypertriglyceridemia    Inguinal hernia, left 01/2015   Muscle spasm    Open wound of genital labia    Peripheral neuropathy    RBBB    [place]izures ([place]    Sepsis [place] 01/19/2018   Sinus tachycardia    a. persistent since 2009.   Smokers' cough ([facility]    Status post mastectomy, right 01/14/2020   Stroke ([facility]   left sided weakness   TIA (transient ischemic attack)    Tobacco abuse    Vulvar abscess 01/23/2018    Past Surgical History:  Procedure Laterality Date   APPLICATION OF WOUND VAC Right 01/29/2020   Procedure: APPLICATION OF WOUND VAC;  Surgeon: RRonny Bacon MD;  Location: [facility] OR[facility];  Service: General;  Laterality: Right;   BREAST BIOPSY Right  11/06/2019   u[place]oreacore path pending venus clip   HERNIA REPAIR Left    INCISION AND DRAINAGE ABSCESS N/A 01/26/2018   Procedure: INCISION AND DEBRIDEMENT OF VULVAR NECROTIZING SOFT TISSUE INFECTION;  Surgeon: WGreer Pickerel MD;  [place] [facility]  Service: General;  Laterality: N/A;   INCISION AND DRAINAGE ABSCESS N/A 07/21/2021   Procedure: INCISION AND DRAINAGE ABSCESS Scalp;  Surgeon: BRobert Bellow MD;  Location: [facility];  Service: General;  Laterality: N/A;  scalp and right abdomen   INCISION AND DRAINAGE PERIRECTAL ABSCESS N/A 01/22/2018   Procedure: IRRIGATION AND DEBRIDEMENT LABIAL/VULVAR AREA;  Surgeon: BCoralie Keens MD;  [place] [facility]  Service: General;  Laterality: N/A;   INCISION AND DRAINAGE PERIRECTAL ABSCESS N/A 01/29/2018   Procedure: IRRIGATION AND DEBRIDEMENT VULVA;  Surgeon: HExcell Seltzer MD;  [place] [facility]  Service: General;  Laterality: N/A;   INGUINAL HERNIA REPAIR Left 04/08/2015   Procedure: OPEN LEFT INGUINAL HERNIA REPAIR WITH MESH;  Surgeon: Ralene Ok, MD;  Location: [place];  Service: General;  Laterality: Left;   INSERTION OF MESH Left 04/08/2015   Procedure: INSERTION OF MESH;  Surgeon: Ralene Ok, MD;  Location: [place];  Service: General;  Laterality: Left;   IRRIGATION AND DEBRIDEMENT ABSCESS N/A 07/21/2021   Procedure: IRRIGATION AND DEBRIDEMENT ABSCESS abdomen;  Surgeon: Robert Bellow, MD;  Location: [facility];  Service: General;  Laterality: N/A;   LAPAROSCOPY     Endometriosis   LEFT HEART CATHETERIZATION WITH CORONARY ANGIOGRAM N/A 12/05/2013   Procedure: LEFT HEART CATHETERIZATION WITH CORONARY ANGIOGRAM;  Surgeon: Jettie Booze, MD;  Location: [facility] CATH LAB;  Service: Cardiovascular;  Laterality: N/A;   MASTECTOMY Right    right kidney drained     SIMPLE MASTECTOMY WITH AXILLARY SENTINEL NODE BIOPSY Right 12/23/2019   Procedure: Right SIMPLE MASTECTOMY WITH AXILLARY SENTINEL NODE BIOPSY;  Surgeon: Ronny Bacon, MD;   Location: [facility];  Service: General;  Laterality: Right;   TEE WITHOUT CARDIOVERSION N/A 11/28/2013   Procedure: TRANSESOPHAGEAL ECHOCARDIOGRAM (TEE);  Surgeon: Thayer Headings, MD;  Location: [place];  Service: Cardiovascular;  Laterality: N/A;   WOUND DEBRIDEMENT Right 01/29/2020   Procedure: DEBRIDEMENT WOUND, Excisional debridement skin, subcutaneous and muscle right chest wall;  Surgeon: Ronny Bacon, MD;  Location: [facility];  Service: General;  Laterality: Right;    Social History:  reports that she has been smoking cigarettes. She started smoking about 40 years ago. She has a 9.00 pack-year smoking history. She has never used smokeless tobacco. She reports that she does not drink alcohol and does not use drugs.  Family History:  Family History  Problem Relation Age of Onset   Venous thrombosis Brother    Other Brother        BRAIN TUMOR   Asthma Father    Diabetes Father    Coronary artery disease Mother    Hypertension Mother    Diabetes Mother    Breast cancer Mother 47   Asthma Sister    Diabetes type II Brother    Diabetes Brother      Prior to Admission medications   Medication Sig Start Date End Date Taking? Authorizing Provider  albuterol (VENTOLIN HFA) 108 (90 Base) MCG/ACT inhaler Inhale 2 puffs into the lungs every 6 (six) hours as needed for wheezing or shortness of breath.    [provider]  ALPRAZolam Duanne Moron) 1 MG tablet Take 1 mg by mouth 4 (four) times daily as needed for anxiety.  12/02/19   [provider]  anastrozole (ARIMIDEX) 1 MG tablet Take 1 tablet (1 mg total) by mouth daily. 03/02/21   Cammie Sickle, MD  aspirin EC 81 MG tablet Take 81 mg by mouth daily before breakfast.    [provider]  blood glucose meter kit and supplies Use up to four times daily as directed. (FOR ICD-10 E10.9, E11.9). 02/22/21   Pleas Koch, NP  budesonide-formoterol (SYMBICORT) 80-4.5 MCG/ACT inhaler INHALE 2 PUFFS BY MOUTH  EVERY 12 HOURS TO PREVENT COUGH OR WHEEZING *RINSE MOUTH AFTER EACH USE* 12/01/20   Pleas Koch, NP  busPIRone (BUSPAR) 30 MG tablet Take 30 mg by mouth 2 (two) times daily.    [provider]  citalopram (CELEXA) 20 MG tablet Take 1 tablet (20 mg total) by mouth daily. 12/17/19   [name],  Elisha Headland, MD  desvenlafaxine (PRISTIQ) 100 MG 24 hr tablet Take 100 mg by mouth daily.    [provider]  doxycycline (VIBRA-TABS) 100 MG tablet Take 1 tablet (100 mg total) by mouth 2 (two) times daily for 10 days. 09/13/21 09/23/21  Criselda Peaches, DPM  Empagliflozin-metFORMIN HCl ER 25-1000 MG TB24 Take 1 tablet by mouth daily with breakfast. 12/03/20 12/03/21  [provider]  EYLEA 2 MG/0.05ML SOSY  03/10/21   [provider]  gentamicin cream (GARAMYCIN) 0.1 % Apply 1 application. topically daily. 09/13/21   Criselda Peaches, DPM  glucose blood test strip Use as instructed 12/25/20   Pleas Koch, NP  Insulin Pen Needle 32G X 4 MM MISC Used to give insulin injections twice daily. Patient not taking: Reported on 09/10/2021 08/23/17   Renato Shin, MD  insulin regular human CONCENTRATED (HUMULIN R) 500 UNIT/ML injection Inject 30 Units into the skin 3 (three) times daily with meals. Sliding scale Patient not taking: Reported on 09/10/2021    [provider]  Insulin Syringe-Needle U-100 (GLOBAL INJECT EASE INSULIN SYR) 30G X 1/2" 1 ML MISC See admin instructions. 05/22/20 05/22/21  [provider]  mupirocin ointment (BACTROBAN) 2 % Apply 1 application topically 2 (two) times daily. 06/16/21   Sharion Balloon, NP  ondansetron (ZOFRAN) 8 MG tablet One pill every 8 hours as needed for nausea/vomitting. 09/10/21   Cammie Sickle, MD  OXYGEN Inhale 2-4 L into the lungs 2 (two) times daily as needed (shortness of breath).    [provider]  pregabalin (LYRICA) 300 MG capsule Take 1 capsule (300 mg total) by mouth 2 (two) times daily. For nerve  pain. 05/04/21   Pleas Koch, NP  ReliOn Ultra Thin Lancets 30G MISC Use as directed for blood sugar checks. 12/25/20   Pleas Koch, NP  rOPINIRole (REQUIP) 1 MG tablet TAKE 1 TABLET BY MOUTH EVERY DAY AT BEDTIME FOR  RESTLESS  LEG 08/16/21   Pleas Koch, NP  rosuvastatin (CRESTOR) 20 MG tablet Take 1 tablet (20 mg total) by mouth daily. For cholesterol. 08/18/20   Pleas Koch, NP  spironolactone (ALDACTONE) 50 MG tablet Take 50 mg by mouth daily. 02/16/21   [provider]  traMADol (ULTRAM) 50 MG tablet Take 1 tablet (50 mg total) by mouth every 6 (six) hours as needed. 07/21/21 07/21/22  Robert Bellow, MD  traZODone (DESYREL) 100 MG tablet Take 200 mg by mouth at bedtime. 11/08/18   [provider]  ULTICARE MINI PEN NEEDLES 31G X 6 MM MISC USE AS DIRECTED 3 TIMES DAILY Patient not taking: Reported on 09/10/2021 05/18/20   Renato Shin, MD    Physical Exam: Vitals:   09/20/21 1412 09/20/21 1430 09/20/21 1500 09/20/21 1721  BP: 123/85 (!) 142/75 (!) 145/76 (!) 149/74  Pulse: 78 79 79 77  Resp: _0 Temp: 98.4 F (36.9 C)   97.7 F (36.5 C)  TempSrc: Oral   Oral  SpO2: 94% (!) 88% 94% 96%  Weight:      Height:       General: Not in acute distress HEENT:       Eyes: PERRL, EOMI, no scleral icterus.       ENT: No discharge from the ears and nose, no pharynx injection, no tonsillar enlargement.        Neck: No JVD, no bruit, no mass felt. Heme: No neck lymph node enlargement. Cardiac:  S1/S2, RRR, No murmurs, No gallops or rubs. Respiratory: No rales, wheezing, rhonchi or rubs. GI: Soft, nondistended, nontender, no rebound pain, no organomegaly, BS present. GU: No hematuria Ext: Has swelling, erythema, tender and warmth in both lower extremities. Has necrotic ulcers in left heel and left ankle area.  Has little pus drainage from left heel ulcer.                Musculoskeletal: No joint deformities, No joint redness or  warmth, no limitation of ROM in spin. Skin: No rashes.  Neuro: Alert, oriented X3, cranial nerves II-XII grossly intact, moves all extremities normally.   Psych: Patient is not psychotic, no suicidal or hemocidal ideation.  Labs on Admission: I have personally reviewed following labs and imaging studies  CBC: Recent Labs  Lab 09/16/21 0847 09/20/21 1426  WBC 8.8 8.5  NEUTROABS 7.0 6.4  HGB 12.2 11.9*  HCT 38.0 39.1  MCV 86 86.9  PLT 200 209   Basic Metabolic Panel: Recent Labs  Lab 09/20/21 1426  NA 133*  K 3.7  CL 97*  CO2 28  GLUCOSE 377*  BUN 29*  CREATININE 1.34*  CALCIUM 9.1   GFR: Estimated Creatinine Clearance: 57.8 mL/min (A) (by C-G formula based on SCr of 1.34 mg/dL (H)). Liver Function Tests: Recent Labs  Lab 09/20/21 1426  AST 13*  ALT 14  ALKPHOS 125  BILITOT 1.0  PROT 9.3*  ALBUMIN 3.6   No results for input(s): LIPASE, AMYLASE in the last 168 hours. No results for input(s): AMMONIA in the last 168 hours. Coagulation Profile: Recent Labs  Lab 09/20/21 1426  INR 1.2   Cardiac Enzymes: No results for input(s): CKTOTAL, CKMB, CKMBINDEX, TROPONINI in the last 168 hours. BNP (last 3 results) Recent Labs    12/01/20 1250  PROBNP 78.0   HbA1C: No results for input(s): HGBA1C in the last 72 hours. CBG: Recent Labs  Lab 09/20/21 1703  GLUCAP 293*   Lipid Profile: No results for input(s): CHOL, HDL, LDLCALC, TRIG, CHOLHDL, LDLDIRECT in the last 72 hours. Thyroid Function Tests: No results for input(s): TSH, T4TOTAL, FREET4, T3FREE, THYROIDAB in the last 72 hours. Anemia Panel: No results for input(s): VITAMINB12, FOLATE, FERRITIN, TIBC, IRON, RETICCTPCT in the last 72 hours. Urine analysis:    Component Value Date/Time   COLORURINE YELLOW (A) 03/03/2021 1658   APPEARANCEUR CLEAR (A) 03/03/2021 1658   LABSPEC 1.017 03/03/2021 1658   PHURINE 6.0 03/03/2021 1658   GLUCOSEU >=500 (A) 03/03/2021 1658   HGBUR NEGATIVE 03/03/2021 1658    HGBUR negative 03/07/2008 1643   BILIRUBINUR neg 04/07/2021 0837   KETONESUR NEGATIVE 03/03/2021 1658   PROTEINUR Positive (A) 04/07/2021 0837   PROTEINUR 100 (A) 03/03/2021 1658   UROBILINOGEN 0.2 04/07/2021 0837   UROBILINOGEN 0.2 10/08/2011 1850   NITRITE neg 04/07/2021 0837   NITRITE NEGATIVE 03/03/2021 1658   LEUKOCYTESUR Negative 04/07/2021 0837   LEUKOCYTESUR NEGATIVE 03/03/2021 1658   Sepsis Labs: _0 (procalcitonin:4,lacticidven:4) )No results found for this or any previous visit (from the past 240 hour(s)).   Radiological Exams on Admission: US Venous Img Lower Unilateral Left  Result Date: 09/20/2021 CLINICAL DATA:  Left lower extremity swelling EXAM: LEFT LOWER EXTREMITY VENOUS DOPPLER ULTRASOUND TECHNIQUE: Gray-scale sonography with compression, as well as color and duplex ultrasound, were performed to evaluate the deep venous system(s) from the level of the common femoral vein through the popliteal and proximal calf veins. COMPARISON:  None. FINDINGS: VENOUS Normal compressibility of the common femoral, superficial  femoral, and popliteal veins, as well as the visualized calf veins. Visualized portions of profunda femoral vein and great saphenous vein unremarkable. No filling defects to suggest DVT on grayscale or color Doppler imaging. Doppler waveforms show normal direction of venous flow, normal respiratory plasticity and response to augmentation. Limited views of the contralateral common femoral vein are unremarkable. OTHER Soft tissue edema of the calf. Limitations: none IMPRESSION: 1. Negative examination for deep venous thrombosis in the left lower extremity. 2.  Soft tissue edema of the left calf. Electronically Signed   By: Delanna Ahmadi M.D.   On: 09/20/2021 15:19   DG Foot Complete Left  Result Date: 09/20/2021 CLINICAL DATA:  Infection Cellulitis Possible abscess Swelling [place] EXAM: LEFT FOOT - COMPLETE 3+ VIEW COMPARISON:  01/13/2021 FINDINGS: Mild hallux  valgus deformity of the great toe. Mild degenerative changes of the first metatarsophalangeal joint. Atherosclerotic changes seen throughout visualized arterial segments. Enthesopathic changes are seen at the insertion of the plantar fascia and Achilles tendon on the calcaneus. No osseous erosions are identified to indicate osteomyelitis. Soft tissue ulcer noted overlying the posterior calcaneus. IMPRESSION: Soft tissue ulcer of the posterior heel without underlying osseous erosion to indicate osteomyelitis. If there is high clinical suspicion for osteomyelitis, further evaluation with MRI should be performed. Electronically Signed   By: Miachel Roux M.D.   On: 09/20/2021 16:35      Assessment/Plan Principal Problem:   Diabetic infection of left foot (HCC) Active Problems:   Cellulitis of lower extremity   Tobacco abuse   COPD with chronic bronchitis (HCC)   Chronic diastolic CHF (congestive heart failure) (HCC)   PAD (peripheral artery disease) (HCC)   Hyperlipidemia   HTN (hypertension)   Diabetes mellitus without complication (HCC)   Chronic kidney disease, stage 3a ([place])   Stroke ([place])   Depression with anxiety   CAD (coronary artery disease)   Breast cancer ([place])   Diabetic infection of left foot and cellulitis of lower extremity: Patient failed outpatient oral antibiotic treatment.  Does not meet criteria for sepsis. Dr. Jacqualyn Posey of podiatry and Dr. Delana Meyer of vascular surgery are consulted.   - Admitted to MedSurg bed as inpatient - Empiric antimicrobial treatment with vancomycin  - PRN Zofran for nausea, tylenol and Percocet for pain - Blood cultures x 2  - ESR and CRP - wound care consult - MRI-left foot -f/u right LE doppler to r/o DVT  Tobacco abuse -nicotine patch  COPD with chronic bronchitis (HCC) -Bronchodilators  Chronic diastolic CHF (congestive heart failure) ([place]):: 2D echo on 01/14/2019 showed EF of 55 to 60%.  Patient does not have shortness of breath.   Patient has leg edema which is likely due to cellulitis.  Does not seem to have CHF exacerbation -Hold spironolactone due to worsening renal function  PAD (peripheral artery disease) ([place]) -f/u vascular surgeon's recommendation -check BNP  Hyperlipidemia -Crestor  HTN (hypertension) -IV hydralazine as needed -Hold spironolactone due to worsening renal function  Diabetes mellitus without complication ([place]): Recent A1c 10.6.  Patient still taking empagliflozin-metformin.  Blood sugar 377. -Sliding scale insulin -Start glargine insulin 10 units daily  Chronic kidney disease, stage 3a ([place]): Baseline creatinine 1.0-1.2.  Her creatinine is 1.34, BUN 29, slightly worsening than baseline. -Hold spironolactone  History of Stroke [facility]) Has a- aspirin, Crestor  Depression with anxiety -Continue home medications  CAD (coronary artery disease): No chest pain -Aspirin, Crestor  Breast cancer [facility]): S/p of her right mastectomy -Continue anastrozole  DVT ppx: SQ Heparin       Code Status:  DNR (I discussed with patient and explained the meaning of CODE STATUS. Patient is very sure that she wants to be DNR)  Family Communication: not done, no family member is at bed side.       Disposition Plan:  Anticipate discharge back to previous environment  Consults called:  Dr. Jacqualyn Posey of podiatry and Dr. Delana Meyer of vascular surgery are consulted.  Admission status and Level of care: Med-Surg:  as inpt       Severity of Illness:  The appropriate patient status for this patient is INPATIENT. Inpatient status is judged to be reasonable and necessary in order to provide the required intensity of service to ensure the patient's safety. The patient's presenting symptoms, physical exam findings, and initial radiographic and laboratory data in the context of their chronic comorbidities is felt to place them at high risk for further clinical deterioration. Furthermore, it is not  anticipated that the patient will be medically stable for discharge from the hospital within 2 midnights of admission.   * I certify that at the point of admission it is my clinical judgment that the patient will require inpatient hospital care spanning beyond 2 midnights from the point of admission due to high intensity of service, high risk for further deterioration and high frequency of surveillance required.*       Date of Service 09/20/2021    Ivor Costa [facility]   If 7PM-7AM, please contact night-coverage [email or web address] 09/20/2021, 6:05 PM

## 2021-09-20 NOTE — Consult Note (Signed)
Reason for Consult:Heel Ulcer Referring Physician: Dr. Ivor Costa, MD  [name] is an 51 y.o. female.  HPI: [name] is a 51 y.o. female with medical history significant of PVD, hypertension, hyperlipidemia, diabetes mellitus, COPD, asthma, stroke, GERD, depression with anxiety, tobacco abuse, seizure, anemia, CAD, CKD-3A, dCHF, right breast cancer (s/p of mastectomy), who presents with left foot ulcer with infection and bilateral lower leg pain.  Previously she was seen at the wound care center.  She has last seen Dr. Sherryle Lis in the office on September 13, 2021.  At that time arterial studies were ordered which she has had completed.  She presents today for worsening of the infection, wounds.  Past Medical History:  Diagnosis Date   Anxiety    takes Prozac daily   Anxiety    Aortic valve calcification    Asthma    Advair and Spirva daily   Asthma    Bipolar disorder (HCC)    Breast cancer, right breast ([place]) 12/23/2019   CAD (coronary artery disease)    a. LHC 11/2013 done for CP/fluid retention: mild disease in prox LAD, mild-mod disease in mRCA, EF 60% with normal LVEDP. b. Normal nuc 03/2016.   Cancer ([place])    Cellulitis and abscess of trunk 03/12/2020   CHF (congestive heart failure) (HCC)    Chronic diastolic CHF (congestive heart failure) (HCC)    Chronic heart failure with preserved ejection fraction ([place]) 11/16/2018   CKD (chronic kidney disease), stage II    COPD (chronic obstructive pulmonary disease) (HCC)    a. nocturnal O2.   COPD (chronic obstructive pulmonary disease) (HCC)    Coronary artery disease    Decreased urine stream    Diabetes mellitus    Diabetes mellitus without complication (HCC)    Dyspnea    Elevated C-reactive protein (CRP) 01/29/2018   Elevated sedimentation rate 01/29/2018   Elevated serum hCG 01/19/2018   Family history of adverse reaction to anesthesia    mom gets nauseated   Foot pain, bilateral 05/22/2018   GERD (gastroesophageal reflux  disease)    takes Pepcid daily   History of blood clots    left leg 3-39yr ago   Hyperlipidemia    Hypertension    Hypertriglyceridemia    Inguinal hernia, left 01/2015   Muscle spasm    Open wound of genital labia    Peripheral neuropathy    RBBB    Seizures ([place]    Sepsis ([place] 01/19/2018   Sinus tachycardia    a. persistent since 2009.   Smokers' cough ([place]    Status post mastectomy, right 01/14/2020   St[facility] 1989   left sided weakness   TIA (transient ischemic attack)    Tobacco abuse    Vulvar abscess 01/23/2018    Past Surgical History:  Procedure Laterality Date   APPLICATION OF WOUND VAC Right 01/29/2020   Procedure: APPLICATION OF WOUND VAC;  Surgeon: RRonny Bacon MD;  Location: [facility];  Service: General;  Laterality: Right;   BREAST BIOPSY Right 11/06/2019   u[place]oreacore path pending venus clip   HERNIA REPAIR Left    INCISION AND DRAINAGE ABSCESS N/A 01/26/2018   Procedure: INCISION AND DEBRIDEMENT OF VULVAR NECROTIZING SOFT TISSUE INFECTION;  Surgeon: WGreer Pickerel MD;  [place]  Service: General;  Laterality: N/A;   INCISION AND DRAINAGE ABSCESS N/A 07/21/2021   Procedure: INCISION AND DRAINAGE ABSCESS Scalp;  Surgeon: BRobert Bellow MD;  Location: [facility];  Service: General;  Laterality: N/A;  scalp and right abdomen   INCISION AND DRAINAGE PERIRECTAL ABSCESS N/A 01/22/2018   Procedure: IRRIGATION AND DEBRIDEMENT LABIAL/VULVAR AREA;  Surgeon: Coralie Keens, MD;  Location: [place];  Service: General;  Laterality: N/A;   INCISION AND DRAINAGE PERIRECTAL ABSCESS N/A 01/29/2018   Procedure: IRRIGATION AND DEBRIDEMENT VULVA;  Surgeon: Excell Seltzer, MD;  Location: [place];  Service: General;  Laterality: N/A;   INGUINAL HERNIA REPAIR Left 04/08/2015   Procedure: OPEN LEFT INGUINAL HERNIA REPAIR WITH MESH;  Surgeon: Ralene Ok, MD;  Location: [place];  Service: General;  Laterality: Left;   INSERTION OF MESH Left 04/08/2015   Procedure:  INSERTION OF MESH;  Surgeon: Ralene Ok, MD;  Location: [place];  Service: General;  Laterality: Left;   IRRIGATION AND DEBRIDEMENT ABSCESS N/A 07/21/2021   Procedure: IRRIGATION AND DEBRIDEMENT ABSCESS abdomen;  Surgeon: Robert Bellow, MD;  Location: [facility];  Service: General;  Laterality: N/A;   LAPAROSCOPY     Endometriosis   LEFT HEART CATHETERIZATION WITH CORONARY ANGIOGRAM N/A 12/05/2013   Procedure: LEFT HEART CATHETERIZATION WITH CORONARY ANGIOGRAM;  Surgeon: Jettie Booze, MD;  Location: [facility] CATH LAB;  Service: Cardiovascular;  Laterality: N/A;   MASTECTOMY Right    right kidney drained     SIMPLE MASTECTOMY WITH AXILLARY SENTINEL NODE BIOPSY Right 12/23/2019   Procedure: Right SIMPLE MASTECTOMY WITH AXILLARY SENTINEL NODE BIOPSY;  Surgeon: Ronny Bacon, MD;  Location: [facility];  Service: General;  Laterality: Right;   TEE WITHOUT CARDIOVERSION N/A 11/28/2013   Procedure: TRANSESOPHAGEAL ECHOCARDIOGRAM (TEE);  Surgeon: Thayer Headings, MD;  Location: [place];  Service: Cardiovascular;  Laterality: N/A;   WOUND DEBRIDEMENT Right 01/29/2020   Procedure: DEBRIDEMENT WOUND, Excisional debridement skin, subcutaneous and muscle right chest wall;  Surgeon: Ronny Bacon, MD;  Location: [facility];  Service: General;  Laterality: Right;    Family History  Problem Relation Age of Onset   Venous thrombosis Brother    Other Brother        BRAIN TUMOR   Asthma Father    Diabetes Father    Coronary artery disease Mother    Hypertension Mother    Diabetes Mother    Breast cancer Mother 46   Asthma Sister    Diabetes type II Brother    Diabetes Brother     Social History:  reports that she has been smoking cigarettes. She started smoking about 40 years ago. She has a 9.00 pack-year smoking history. She has never used smokeless tobacco. She reports that she does not drink alcohol and does not use drugs.  Allergies:  Allergies  Allergen Reactions   Adhesive [Tape]  Rash and Other (See Comments)    TAKES OFF THE SKIN (CERTAIN MEDICAL TAPES DO THIS!!)   Metoprolol Shortness Of Breath    Occurrence of shortness of breath after 3 days   Montelukast Shortness Of Breath   Morphine Sulfate Anaphylaxis, Shortness Of Breath and Nausea And Vomiting    Swollen Throat - Able to tolerate dilaudid   Penicillins Anaphylaxis, Hives and Shortness Of Breath    Throat swells Has patient had a PCN reaction causing immediate rash, facial/tongue/throat swelling, SOB or lightheadedness with hypotension: Yes Has patient had a PCN reaction causing severe rash involving mucus membranes or skin necrosis: No Has patient had a PCN reaction that required hospitalization: Yes Has patient had a PCN reaction occurring within the last 10 years: No If all of the above answers are "NO", then may  proceed with Cephalosporin use.    Silicone Rash    Takes off the skin (certain medical tapes do this)   Diltiazem Swelling   Doxycycline Hives   Gabapentin Swelling   Midodrine     Lightheaded and falling down    Medications: I have reviewed the patient's current medications.  Results for orders placed or performed during the hospital encounter of 09/20/21 (from the past 48 hour(s))  Pregnancy, urine     Status: None   Collection Time: 09/20/21  2:17 PM  Result Value Ref Range   Preg Test, Ur NEGATIVE NEGATIVE    Comment: Performed at [facility], [address] [place], [place] [address]  CBC with Differential     Status: Abnormal   Collection Time: 09/20/21  2:26 PM  Result Value Ref Range   WBC 8.5 4.0 - 10.5 K/uL   RBC 4.50 3.87 - 5.11 MIL/uL   Hemoglobin 11.9 (L) 12.0 - 15.0 g/dL   HCT 39.1 36.0 - 46.0 %   MCV 86.9 80.0 - 100.0 fL   MCH 26.4 26.0 - 34.0 pg   MCHC 30.4 30.0 - 36.0 g/dL   RDW 14.8 11.5 - 15.5 %   Platelets 219 150 - 400 K/uL   nRBC 0.0 0.0 - 0.2 %   Neutrophils Relative % 75 %   Neutro Abs 6.4 1.7 - 7.7 K/uL   Lymphocytes Relative 14 %    Lymphs Abs 1.2 0.7 - 4.0 K/uL   Monocytes Relative 6 %   Monocytes Absolute 0.5 0.1 - 1.0 K/uL   Eosinophils Relative 3 %   Eosinophils Absolute 0.2 0.0 - 0.5 K/uL   Basophils Relative 1 %   Basophils Absolute 0.1 0.0 - 0.1 K/uL   Immature Granulocytes 1 %   Abs Immature Granulocytes 0.06 0.00 - 0.07 K/uL    Comment: Performed at [facility], [address] [place], [place] [address]  Comprehensive metabolic panel     Status: Abnormal   Collection Time: 09/20/21  2:26 PM  Result Value Ref Range   Sodium 133 (L) 135 - 145 mmol/L   Potassium 3.7 3.5 - 5.1 mmol/L   Chloride 97 (L) 98 - 111 mmol/L   CO2 28 22 - 32 mmol/L   Glucose, Bld 377 (H) 70 - 99 mg/dL    Comment: Glucose reference range applies only to samples taken after fasting for at least 8 hours.   BUN 29 (H) 6 - 20 mg/dL   Creatinine, Ser 1.34 (H) 0.44 - 1.00 mg/dL   Calcium 9.1 8.9 - 10.3 mg/dL   Total Protein 9.3 (H) 6.5 - 8.1 g/dL   Albumin 3.6 3.5 - 5.0 g/dL   AST 13 (L) 15 - 41 U/L   ALT 14 0 - 44 U/L   Alkaline Phosphatase 125 38 - 126 U/L   Total Bilirubin 1.0 0.3 - 1.2 mg/dL   GFR, Estimated 48 (L) >60 mL/min    Comment: (NOTE) Calculated using the CKD-EPI Creatinine Equation (2021)    Anion gap 8 5 - 15    Comment: Performed at [facility], [address] [place], [place] [address]  Lactic acid, plasma     Status: None   Collection Time: 09/20/21  2:26 PM  Result Value Ref Range   Lactic Acid, Venous 1.2 0.5 - 1.9 mmol/L    Comment: Performed at [facility], [address], [place],  [address]  Sedimentation rate     Status: Abnormal  Collection Time: 09/20/21  2:26 PM  Result Value Ref Range   Sed Rate 67 (H) 0 - 30 mm/hr    Comment: Performed at [facility], [address] [place], [place] [address]  APTT     Status: Abnormal   Collection Time: 09/20/21  2:26 PM  Result Value Ref Range   aPTT 38 (H) 24 - 36 seconds    Comment:        IF BASELINE  aPTT IS ELEVATED, SUGGEST PATIENT RISK ASSESSMENT BE USED TO DETERMINE APPROPRIATE ANTICOAGULANT THERAPY. Performed at [facility], [address] [place], [place] [address]   Protime-INR     Status: Abnormal   Collection Time: 09/20/21  2:26 PM  Result Value Ref Range   Prothrombin Time 15.3 (H) 11.4 - 15.2 seconds   INR 1.2 0.8 - 1.2    Comment: (NOTE) INR goal varies based on device and disease states. Performed at [facility], [address] [place], [place] [address]   CBG monitoring, ED     Status: Abnormal   Collection Time: 09/20/21  5:03 PM  Result Value Ref Range   Glucose-Capillary 293 (H) 70 - 99 mg/dL    Comment: Glucose reference range applies only to samples taken after fasting for at least 8 hours.   *Note: Due to a large number of results and/or encounters for the requested time period, some results have not been displayed. A complete set of results can be found in Results Review.    US Venous Img Lower Unilateral Left  Result Date: 09/20/2021 CLINICAL DATA:  Left lower extremity swelling EXAM: LEFT LOWER EXTREMITY VENOUS DOPPLER ULTRASOUND TECHNIQUE: Gray-scale sonography with compression, as well as color and duplex ultrasound, were performed to evaluate the deep venous system(s) from the level of the common femoral vein through the popliteal and proximal calf veins. COMPARISON:  None. FINDINGS: VENOUS Normal compressibility of the common femoral, superficial femoral, and popliteal veins, as well as the visualized calf veins. Visualized portions of profunda femoral vein and great saphenous vein unremarkable. No filling defects to suggest DVT on grayscale or color Doppler imaging. Doppler waveforms show normal direction of venous flow, normal respiratory plasticity and response to augmentation. Limited views of the contralateral common femoral vein are unremarkable. OTHER Soft tissue edema of the calf. Limitations: none IMPRESSION: 1. Negative  examination for deep venous thrombosis in the left lower extremity. 2.  Soft tissue edema of the left calf. Electronically Signed   By: Delanna Ahmadi M.D.   On: 09/20/2021 15:19   DG Foot Complete Left  Result Date: 09/20/2021 CLINICAL DATA:  Infection Cellulitis Possible abscess Swelling [place] EXAM: LEFT FOOT - COMPLETE 3+ VIEW COMPARISON:  01/13/2021 FINDINGS: Mild hallux valgus deformity of the great toe. Mild degenerative changes of the first metatarsophalangeal joint. Atherosclerotic changes seen throughout visualized arterial segments. Enthesopathic changes are seen at the insertion of the plantar fascia and Achilles tendon on the calcaneus. No osseous erosions are identified to indicate osteomyelitis. Soft tissue ulcer noted overlying the posterior calcaneus. IMPRESSION: Soft tissue ulcer of the posterior heel without underlying osseous erosion to indicate osteomyelitis. If there is high clinical suspicion for osteomyelitis, further evaluation with MRI should be performed. Electronically Signed   By: Miachel Roux M.D.   On: 09/20/2021 16:35    Review of Systems Blood pressure (!) 149/74, pulse 77, temperature 97.7 F (36.5 C), temperature source Oral, resp. rate 16, height '5\' 4"'$  (1.626 m), weight 104.3 kg, SpO2 96 %.  Physical Exam General: AAO x3, NAD  Dermatological: Eschar noted to the posterior aspect left heel as well as the dorsal aspect of the left foot.  On the margins of the heel wound there does appear to be possible abscess but there is no fluctuation or crepitation.  Ulceration noted to the proximal right lateral leg.  Erythema present bilaterally.  There is no fluctuation crepitation bilaterally.        Vascular: Dorsalis Pedis artery and Posterior Tibial artery pedal pulses are palpable bilateral with immedate capillary fill time. There is no pain with calf compression, swelling, warmth, erythema.   Neruologic: Sensation decreased   Musculoskeletal: No pain on  exam   Assessment/Plan: Bilateral ulcerations  X-ray of the left foot reviewed independently.  MRI of the foot ordered to also add an MRI of the heel to further evaluate the ulcers.  Previous arterial studies performed last week were within normal limits.  Venous duplex negative today.  At this point continue IV antibiotics await the results of the MRI.  Patient would likely benefit from surgical debridement particularly of the left heel wound.  We will put her n.p.o. for tomorrow in case of potential surgery pending MRI results.  Discussed the case with Dr. Amalia Hailey who will see her tomorrow.  [name] 09/20/2021, 5:50 PM

## 2021-09-21 ENCOUNTER — Inpatient Hospital Stay: Payer: BC Managed Care – PPO

## 2021-09-21 ENCOUNTER — Encounter
Admission: EM | Disposition: A | Discharge status: Home / Self Care | Payer: Self-pay | Source: Home / Self Care | Attending: Internal Medicine

## 2021-09-21 ENCOUNTER — Inpatient Hospital Stay: Payer: BC Managed Care – PPO | Admitting: Anesthesiology

## 2021-09-21 ENCOUNTER — Ambulatory Visit: Payer: Medicare Other | Admitting: Podiatry

## 2021-09-21 ENCOUNTER — Other Ambulatory Visit: Payer: Self-pay

## 2021-09-21 DIAGNOSIS — E11628 Type 2 diabetes mellitus with other skin complications: Secondary | ICD-10-CM

## 2021-09-21 DIAGNOSIS — L03116 Cellulitis of left lower limb: Secondary | ICD-10-CM | POA: Diagnosis not present

## 2021-09-21 DIAGNOSIS — L97512 Non-pressure chronic ulcer of other part of right foot with fat layer exposed: Secondary | ICD-10-CM

## 2021-09-21 DIAGNOSIS — L97522 Non-pressure chronic ulcer of other part of left foot with fat layer exposed: Secondary | ICD-10-CM

## 2021-09-21 DIAGNOSIS — I739 Peripheral vascular disease, unspecified: Secondary | ICD-10-CM | POA: Diagnosis not present

## 2021-09-21 DIAGNOSIS — L089 Local infection of the skin and subcutaneous tissue, unspecified: Secondary | ICD-10-CM | POA: Diagnosis not present

## 2021-09-21 LAB — BASIC METABOLIC PANEL
Anion gap: 8 (ref 5–15)
BUN: 33 mg/dL — ABNORMAL HIGH (ref 6–20)
CO2: 28 mmol/L (ref 22–32)
Calcium: 8.9 mg/dL (ref 8.9–10.3)
Chloride: 102 mmol/L (ref 98–111)
Creatinine, Ser: 1.07 mg/dL — ABNORMAL HIGH (ref 0.44–1.00)
GFR, Estimated: >60 mL/min (ref >60)
Glucose, Bld: 253 mg/dL — ABNORMAL HIGH (ref 70–99)
Potassium: 3.9 mmol/L (ref 3.5–5.1)
Sodium: 138 mmol/L (ref 135–145)

## 2021-09-21 LAB — CBC
HCT: 35.5 % — ABNORMAL LOW (ref 36.0–46.0)
Hemoglobin: 11.1 g/dL — ABNORMAL LOW (ref 12.0–15.0)
MCH: 26.7 pg (ref 26.0–34.0)
MCHC: 31.3 g/dL (ref 30.0–36.0)
MCV: 85.5 fL (ref 80.0–100.0)
Platelets: 203 10*3/uL (ref 150–400)
RBC: 4.15 MIL/uL (ref 3.87–5.11)
RDW: 14.9 % (ref 11.5–15.5)
WBC: 7.8 10*3/uL (ref 4.0–10.5)
nRBC: 0 % (ref 0.0–0.2)

## 2021-09-21 LAB — GLUCOSE, CAPILLARY
Glucose-Capillary: 248 mg/dL — ABNORMAL HIGH (ref 70–99)
Glucose-Capillary: 205 mg/dL — ABNORMAL HIGH (ref 70–99)
Glucose-Capillary: 207 mg/dL — ABNORMAL HIGH (ref 70–99)
Glucose-Capillary: 181 mg/dL — ABNORMAL HIGH (ref 70–99)
Glucose-Capillary: 204 mg/dL — ABNORMAL HIGH (ref 70–99)
Glucose-Capillary: 246 mg/dL — ABNORMAL HIGH (ref 70–99)

## 2021-09-21 LAB — SURGICAL PCR SCREEN
MRSA, PCR: NEGATIVE
Staphylococcus aureus: NEGATIVE

## 2021-09-21 SURGERY — INCISION AND DRAINAGE
Anesthesia: General | Laterality: Bilateral

## 2021-09-21 MED ORDER — SUGAMMADEX SODIUM 200 MG/2ML IV SOLN
INTRAVENOUS | Status: DC | PRN
  Administered 2021-09-21: 200 mg via INTRAVENOUS
  Administered 2021-09-21: 50 mg via INTRAVENOUS

## 2021-09-21 MED ORDER — FENTANYL CITRATE (PF) 100 MCG/2ML IJ SOLN
INTRAMUSCULAR | Status: DC | PRN
  Administered 2021-09-21: 50 ug via INTRAVENOUS

## 2021-09-21 MED ORDER — FENTANYL CITRATE (PF) 100 MCG/2ML IJ SOLN
INTRAMUSCULAR | Status: AC
  Administered 2021-09-21: 25 ug via INTRAVENOUS
  Filled 2021-09-21: qty 2

## 2021-09-21 MED ORDER — SODIUM CHLORIDE 0.9 % IV SOLN
2.0000 g | Freq: Three times a day (TID) | INTRAVENOUS | Status: DC
  Administered 2021-09-21 – 2021-09-23 (×6): 2 g via INTRAVENOUS
  Filled 2021-09-21 (×8): qty 2

## 2021-09-21 MED ORDER — GLYCOPYRROLATE 0.2 MG/ML IJ SOLN
INTRAMUSCULAR | Status: DC | PRN
  Administered 2021-09-21: .2 mg via INTRAVENOUS

## 2021-09-21 MED ORDER — PHENYLEPHRINE 40 MCG/ML (10ML) SYRINGE FOR IV PUSH (FOR BLOOD PRESSURE SUPPORT)
PREFILLED_SYRINGE | INTRAVENOUS | Status: DC | PRN
  Administered 2021-09-21 (×3): 80 ug via INTRAVENOUS

## 2021-09-21 MED ORDER — FENTANYL CITRATE (PF) 100 MCG/2ML IJ SOLN
25.0000 ug | INTRAMUSCULAR | Status: DC | PRN
  Administered 2021-09-21: 25 ug via INTRAVENOUS

## 2021-09-21 MED ORDER — FENTANYL CITRATE (PF) 100 MCG/2ML IJ SOLN
INTRAMUSCULAR | Status: AC
  Filled 2021-09-21: qty 2

## 2021-09-21 MED ORDER — VANCOMYCIN HCL 1000 MG IV SOLR
INTRAVENOUS | Status: AC
  Filled 2021-09-21: qty 20

## 2021-09-21 MED ORDER — METRONIDAZOLE 500 MG/100ML IV SOLN
500.0000 mg | Freq: Two times a day (BID) | INTRAVENOUS | Status: DC
  Administered 2021-09-21 – 2021-09-22 (×4): 500 mg via INTRAVENOUS
  Filled 2021-09-21 (×5): qty 100

## 2021-09-21 MED ORDER — PROPOFOL 10 MG/ML IV BOLUS
INTRAVENOUS | Status: DC | PRN
  Administered 2021-09-21: 120 mg via INTRAVENOUS

## 2021-09-21 MED ORDER — MIDAZOLAM HCL 2 MG/2ML IJ SOLN
INTRAMUSCULAR | Status: AC
  Filled 2021-09-21: qty 2

## 2021-09-21 MED ORDER — EPHEDRINE 5 MG/ML INJ
INTRAVENOUS | Status: AC
  Filled 2021-09-21: qty 5

## 2021-09-21 MED ORDER — SODIUM CHLORIDE 0.9 % IV SOLN: 100.0000 mg | Freq: Two times a day (BID) | INTRAVENOUS | Status: DC

## 2021-09-21 MED ORDER — VANCOMYCIN HCL 1250 MG/250ML IV SOLN
1250.0000 mg | INTRAVENOUS | Status: DC
Start: 2021-09-22 — End: 2021-09-22
  Filled 2021-09-21: qty 250

## 2021-09-21 MED ORDER — ACETAMINOPHEN 10 MG/ML IV SOLN: 1000.0000 mg | Freq: Once | INTRAVENOUS | Status: DC | PRN

## 2021-09-21 MED ORDER — LIDOCAINE HCL (CARDIAC) PF 100 MG/5ML IV SOSY
PREFILLED_SYRINGE | INTRAVENOUS | Status: DC | PRN
  Administered 2021-09-21: 100 mg via INTRAVENOUS

## 2021-09-21 MED ORDER — BUPIVACAINE HCL (PF) 0.5 % IJ SOLN
INTRAMUSCULAR | Status: AC
  Filled 2021-09-21: qty 30

## 2021-09-21 MED ORDER — ROCURONIUM BROMIDE 100 MG/10ML IV SOLN
INTRAVENOUS | Status: DC | PRN
  Administered 2021-09-21: 40 mg via INTRAVENOUS

## 2021-09-21 MED ORDER — VANCOMYCIN HCL IN DEXTROSE 1-5 GM/200ML-% IV SOLN
1000.0000 mg | Freq: Once | INTRAVENOUS | Status: AC
  Administered 2021-09-21: 1000 mg via INTRAVENOUS
  Filled 2021-09-21: qty 200

## 2021-09-21 MED ORDER — LACTATED RINGERS IV SOLN: INTRAVENOUS | Status: DC | PRN

## 2021-09-21 MED ORDER — EPHEDRINE SULFATE (PRESSORS) 50 MG/ML IJ SOLN
INTRAMUSCULAR | Status: DC | PRN
  Administered 2021-09-21: 5 mg via INTRAVENOUS
  Administered 2021-09-21 (×2): 10 mg via INTRAVENOUS

## 2021-09-21 MED ORDER — ONDANSETRON HCL 4 MG/2ML IJ SOLN
INTRAMUSCULAR | Status: DC | PRN
  Administered 2021-09-21: 4 mg via INTRAVENOUS

## 2021-09-21 SURGICAL SUPPLY — 75 items
"PENCIL ELECTRO HAND CTR " (MISCELLANEOUS) ×2 IMPLANT
BLADE OSC/SAGITTAL MD 5.5X18 (BLADE) IMPLANT
BLADE OSCILLATING/SAGITTAL (BLADE)
BLADE SURG 15 STRL LF DISP TIS (BLADE) ×2 IMPLANT
BLADE SURG 15 STRL SS (BLADE) ×3
BLADE SW THK.38XMED LNG THN (BLADE) IMPLANT
BNDG COHESIVE 4X5 TAN ST LF (GAUZE/BANDAGES/DRESSINGS) ×4 IMPLANT
BNDG COHESIVE 6X5 TAN ST LF (GAUZE/BANDAGES/DRESSINGS) ×2 IMPLANT
BNDG CONFORM 2 STRL LF (GAUZE/BANDAGES/DRESSINGS) ×3 IMPLANT
BNDG CONFORM 3 STRL LF (GAUZE/BANDAGES/DRESSINGS) ×2 IMPLANT
BNDG ELASTIC 4X5.8 VLCR STR LF (GAUZE/BANDAGES/DRESSINGS) ×4 IMPLANT
BNDG ESMARK 4X12 TAN STRL LF (GAUZE/BANDAGES/DRESSINGS) ×2 IMPLANT
BNDG GAUZE ELAST 4 BULKY (GAUZE/BANDAGES/DRESSINGS) ×4 IMPLANT
CANISTER WOUND CARE 500ML ATS (WOUND CARE) ×2 IMPLANT
CHLORAPREP W/TINT 26 (MISCELLANEOUS) ×2 IMPLANT
CUFF TOURN SGL QUICK 12 (TOURNIQUET CUFF) IMPLANT
CUFF TOURN SGL QUICK 18X4 (TOURNIQUET CUFF) IMPLANT
DRAPE FLUOR MINI C-ARM 54X84 (DRAPES) IMPLANT
DRAPE XRAY CASSETTE 23X24 (DRAPES) IMPLANT
DRSG EMULSION OIL 3X3 NADH (GAUZE/BANDAGES/DRESSINGS) ×1 IMPLANT
DRSG EMULSION OIL 3X8 NADH (GAUZE/BANDAGES/DRESSINGS) ×1 IMPLANT
DURAPREP 26ML APPLICATOR (WOUND CARE) ×2 IMPLANT
ELECT REM PT RETURN 9FT ADLT (ELECTROSURGICAL) ×3
ELECTRODE REM PT RTRN 9FT ADLT (ELECTROSURGICAL) ×2 IMPLANT
GAUZE PACKING 1/4 X5 YD (GAUZE/BANDAGES/DRESSINGS) ×2 IMPLANT
GAUZE PACKING IODOFORM 1X5 (PACKING) ×3 IMPLANT
GAUZE SPONGE 4X4 12PLY STRL (GAUZE/BANDAGES/DRESSINGS) ×4 IMPLANT
GAUZE XEROFORM 1X8 LF (GAUZE/BANDAGES/DRESSINGS) ×2 IMPLANT
GLOVE SURG ENC MOIS LTX SZ7 (GLOVE) ×6 IMPLANT
GLOVE SURG SYN 7.5  E (GLOVE) ×6
GLOVE SURG SYN 7.5 E (GLOVE) ×4 IMPLANT
GLOVE SURG SYN 7.5 PF PI (GLOVE) ×4 IMPLANT
GLOVE SURG UNDER POLY LF SZ7.5 (GLOVE) ×3 IMPLANT
GOWN STRL REUS W/ TWL XL LVL3 (GOWN DISPOSABLE) ×4 IMPLANT
GOWN STRL REUS W/TWL MED LVL3 (GOWN DISPOSABLE) IMPLANT
GOWN STRL REUS W/TWL XL LVL3 (GOWN DISPOSABLE) ×6
HANDPIECE VERSAJET DEBRIDEMENT (MISCELLANEOUS) ×1 IMPLANT
IV NS 1000ML (IV SOLUTION)
IV NS 1000ML BAXH (IV SOLUTION) ×2 IMPLANT
KIT DRSG VAC SLVR GRANUFM (MISCELLANEOUS) ×2 IMPLANT
KIT TURNOVER KIT A (KITS) ×3 IMPLANT
LABEL OR SOLS (LABEL) ×3 IMPLANT
MANIFOLD NEPTUNE II (INSTRUMENTS) ×3 IMPLANT
MATRIX WOUND 3-LAYER 5X5 (Tissue) ×1 IMPLANT
MATRIX WOUND 3-LAYER 7X10 (Tissue) ×1 IMPLANT
MICROMATRIX 1000MG (Tissue) ×6 IMPLANT
NDL FILTER BLUNT 18X1 1/2 (NEEDLE) ×2 IMPLANT
NDL HYPO 25X1 1.5 SAFETY (NEEDLE) ×4 IMPLANT
NEEDLE FILTER BLUNT 18X 1/2SAF (NEEDLE) ×1
NEEDLE FILTER BLUNT 18X1 1/2 (NEEDLE) ×2 IMPLANT
NEEDLE HYPO 25X1 1.5 SAFETY (NEEDLE) ×6 IMPLANT
NS IRRIG 500ML POUR BTL (IV SOLUTION) ×3 IMPLANT
PACK EXTREMITY ARMC (MISCELLANEOUS) ×3 IMPLANT
PAD ABD DERMACEA PRESS 5X9 (GAUZE/BANDAGES/DRESSINGS) ×3 IMPLANT
PENCIL ELECTRO HAND CTR (MISCELLANEOUS) ×3 IMPLANT
PULSAVAC PLUS IRRIG FAN TIP (DISPOSABLE) ×3
RASP SM TEAR CROSS CUT (RASP) IMPLANT
SHIELD FULL FACE ANTIFOG 7M (MISCELLANEOUS) ×2 IMPLANT
SOL PREP PVP 2OZ (MISCELLANEOUS) ×3
SOLUTION PARTIC MCRMTRX 1000MG (Tissue) IMPLANT
SOLUTION PREP PVP 2OZ (MISCELLANEOUS) ×2 IMPLANT
STOCKINETTE IMPERVIOUS 9X36 MD (GAUZE/BANDAGES/DRESSINGS) ×3 IMPLANT
SUT ETHILON 2 0 FS 18 (SUTURE) ×4 IMPLANT
SUT ETHILON 4-0 (SUTURE)
SUT ETHILON 4-0 FS2 18XMFL BLK (SUTURE)
SUT VIC AB 3-0 SH 27 (SUTURE)
SUT VIC AB 3-0 SH 27X BRD (SUTURE) ×2 IMPLANT
SUT VIC AB 4-0 FS2 27 (SUTURE) ×2 IMPLANT
SUTURE ETHLN 4-0 FS2 18XMF BLK (SUTURE) ×2 IMPLANT
SWAB CULTURE AMIES ANAERIB BLU (MISCELLANEOUS) IMPLANT
SYR 10ML LL (SYRINGE) ×3 IMPLANT
SYR 3ML LL SCALE MARK (SYRINGE) ×3 IMPLANT
TIP FAN IRRIG PULSAVAC PLUS (DISPOSABLE) ×2 IMPLANT
UNDERPAD 30X36 HEAVY ABSORB (UNDERPADS AND DIAPERS) ×3 IMPLANT
WATER STERILE IRR 500ML POUR (IV SOLUTION) ×3 IMPLANT

## 2021-09-21 NOTE — Assessment & Plan Note (Signed)
-   Continue antibiotics as noted under diabetic foot infection ?

## 2021-09-21 NOTE — Progress Notes (Signed)
?Progress Note ? ? ? ?[name]   ?MBW:[id number]  ?DOB: Sep 04, 1970  ?DOA: 09/20/2021     1 ?PCP: Pleas Koch, NP ? ?Initial CC: left foot pain and wound ? ?Hospital Course: ?[name] is a 51 yo female with PMH PVD, HTN, HLD, DMII, COPD, asthma, CVA, GERD, depression/anxiety, tobacco abuse, seizure d/o, anemia, CAD, CKD3a, dCHF, right breast cancer (s/p mastectomy) who presented with a left foot ulcer/infection and B/L lower leg pain.  ?She has been followed outpatient by podiatry and was recently on doxycycline but states she feels that she developed a rash on her lower abdomen and inner thighs. ?Due to some drainage from her left heel ulcer and worsening pain, she presented for further evaluation. ?She underwent work-up on admission.  Left lower extremity duplex negative for DVT.  Left foot x-ray showed soft tissue ulceration on the posterior heel.  MRI left foot and heel showed heel ulceration but no drainable abscess or evidence of osteomyelitis. ? ?Interval History:  ?Seen this morning with husband present bedside.  Explained plan for debridement later today with podiatry, she was amenable to this.  She endorsed ongoing pain notably in her left lower extremity but also having pain over a wound involving her right anterior leg as well. ? ?Assessment and Plan: ?* Diabetic infection of left foot (HCC) ?- ulceration noted on xray and MRI; no evidence of OM or abscess ?- s/p vanc on admission ?- continue on vanc/rocephin/flagyl (pharmacy has reviewed allergy profile with patient as well; has tolerated cephalosporin in the past) ?- consults placed to podiatry and [facility] on admission ?- s/p I&D to B/L LE on 09/21/21 ?- follow up any pending cultures  ? ?Cellulitis of lower extremity ?- Continue antibiotics as noted under diabetic foot infection ? ?PAD (peripheral artery disease) ([place]) ?- recent ABIs outpatient; [facility] consulted on admission as well ?- tentative plan for angiogram for further evaluation of LE  circulation ?-Continue aspirin and Crestor ? ?Breast cancer ([place]) ?- Continue anastrozole ?- s/p right mastectomy  ? ?CAD (coronary artery disease) ?- Continue aspirin, Crestor ? ?Depression with anxiety ?- On Pristiq at home.  Currently on Effexor as substitution ? ?Chronic kidney disease, stage 3a ([place]) ?- patient has history of CKD3a. Baseline creat ~ 1 - 1.1, eGFR 50 ? ? ?Diabetes mellitus without complication ([place]) ?- Continue current insulin regimen for now and will modify as necessary ?- Follow-up repeat A1c ? ?HTN (hypertension) ?- Spironolactone appears to be only antihypertensive noted on med rec ?- Blood pressure currently stable without need for medication ?- We will treat as needed ? ?Hyperlipidemia ?- Continue Crestor ? ?Chronic diastolic CHF (congestive heart failure) (HCC) ?- no s/s exacerbation  ? ?COPD with chronic bronchitis ([place]) ?- no s/s exacerbation  ? ?Tobacco abuse ?- multiple recommendations have been made for smoking cessation  ?-Continue nicotine patch ? ? ? ?Old records reviewed in assessment of this patient ? ?Antimicrobials: ?Vancomycin 09/20/2021 >> current ?Cefepime 09/21/2021 >> current ?Flagyl 09/21/2021 >> current ? ?DVT prophylaxis:  ?SCDs Start: 09/21/21 1533 ?heparin injection 5,000 Units Start: 09/20/21 1615 ? ? ?Code Status:   Code Status: DNR ? ?Disposition Plan: Home in 2 to 3 days ?Status is: Inpatient ? ?Objective: ?Blood pressure 108/62, pulse 74, temperature 97.8 ?F (36.6 ?C), resp. rate 16, height _0  (1.626 m), weight 104.3 kg, SpO2 98 %.  ?Examination:  ?Physical Exam ?Constitutional:   ?   General: She is not in acute distress. ?   Appearance: Normal  appearance.  ?HENT:  ?   Head: Normocephalic and atraumatic.  ?   Mouth/Throat:  ?   Mouth: Mucous membranes are moist.  ?Eyes:  ?   Extraocular Movements: Extraocular movements intact.  ?Cardiovascular:  ?   Rate and Rhythm: Normal rate and regular rhythm.  ?Pulmonary:  ?   Effort: Pulmonary effort is normal.  ?    Breath sounds: Normal breath sounds.  ?Abdominal:  ?   General: Bowel sounds are normal. There is no distension.  ?   Palpations: Abdomen is soft.  ?   Tenderness: There is no abdominal tenderness.  ?Musculoskeletal:     ?   General: Swelling and tenderness present.  ?   Cervical back: Normal range of motion and neck supple.  ?Skin: ?   Comments: Mild scattered rash noted across lower abdomen and inner thighs.  Ulcerated skin appreciated over right anterior leg.  Chronic stasis changes bilaterally lower extremity  ?Neurological:  ?   Mental Status: She is alert and oriented to person, place, and time.  ?Psychiatric:     ?   Mood and Affect: Mood normal.     ?   Behavior: Behavior normal.  ?  ? ?Consultants:  ?Podiatry ?Vascular surgery ? ?Procedures:  ?LLE I&D, 09/21/21 ? ?Data Reviewed: ?Results for orders placed or performed during the hospital encounter of 09/20/21 (from the past 24 hour(s))  ?CBG monitoring, ED     Status: Abnormal  ? Collection Time: 09/20/21  5:03 PM  ?Result Value Ref Range  ? Glucose-Capillary 293 (H) 70 - 99 mg/dL  ?Resp Panel by RT-PCR (Flu A&B, Covid) Nasopharyngeal Swab     Status: None  ? Collection Time: 09/20/21  5:26 PM  ? Specimen: Nasopharyngeal Swab; Nasopharyngeal(NP) swabs in vial transport medium  ?Result Value Ref Range  ? SARS Coronavirus 2 by RT PCR NEGATIVE NEGATIVE  ? Influenza A by PCR NEGATIVE NEGATIVE  ? Influenza B by PCR NEGATIVE NEGATIVE  ?Blood culture (routine x 2)     Status: None (Preliminary result)  ? Collection Time: 09/20/21  7:47 PM  ? Specimen: BLOOD  ?Result Value Ref Range  ? Specimen Description BLOOD LEFT FOREARM   ? Special Requests    ?  BOTTLES DRAWN AEROBIC AND ANAEROBIC Blood Culture adequate volume  ? Culture    ?  NO GROWTH < 12 HOURS ?Performed at [facility], [address] [place], [place] [address] ?  ? Report Status PENDING   ?Blood culture (routine x 2)     Status: None (Preliminary result)  ? Collection Time: 09/20/21  7:56 PM   ? Specimen: BLOOD  ?Result Value Ref Range  ? Specimen Description BLOOD LEFT HAND   ? Special Requests    ?  BOTTLES DRAWN AEROBIC AND ANAEROBIC Blood Culture adequate volume  ? Culture    ?  NO GROWTH < 12 HOURS ?Performed at [facility], [address] [place], [place] [address] ?  ? Report Status PENDING   ?Glucose, capillary     Status: Abnormal  ? Collection Time: 09/20/21  9:40 PM  ?Result Value Ref Range  ? Glucose-Capillary 277 (H) 70 - 99 mg/dL  ? Comment 1 Notify RN   ?Basic metabolic panel     Status: Abnormal  ? Collection Time: 09/21/21  3:10 AM  ?Result Value Ref Range  ? Sodium 138 135 - 145 mmol/L  ? Potassium 3.9 3.5 - 5.1 mmol/L  ? Chloride 102 98 - 111  mmol/L  ? CO2 28 22 - 32 mmol/L  ? Glucose, Bld 253 (H) 70 - 99 mg/dL  ? BUN 33 (H) 6 - 20 mg/dL  ? Creatinine, Ser 1.07 (H) 0.44 - 1.00 mg/dL  ? Calcium 8.9 8.9 - 10.3 mg/dL  ? GFR, Estimated >60 >60 mL/min  ? Anion gap 8 5 - 15  ?CBC     Status: Abnormal  ? Collection Time: 09/21/21  3:10 AM  ?Result Value Ref Range  ? WBC 7.8 4.0 - 10.5 K/uL  ? RBC 4.15 3.87 - 5.11 MIL/uL  ? Hemoglobin 11.1 (L) 12.0 - 15.0 g/dL  ? HCT 35.5 (L) 36.0 - 46.0 %  ? MCV 85.5 80.0 - 100.0 fL  ? MCH 26.7 26.0 - 34.0 pg  ? MCHC 31.3 30.0 - 36.0 g/dL  ? RDW 14.9 11.5 - 15.5 %  ? Platelets 203 150 - 400 K/uL  ? nRBC 0.0 0.0 - 0.2 %  ?Glucose, capillary     Status: Abnormal  ? Collection Time: 09/21/21  7:47 AM  ?Result Value Ref Range  ? Glucose-Capillary 248 (H) 70 - 99 mg/dL  ?Glucose, capillary     Status: Abnormal  ? Collection Time: 09/21/21 10:42 AM  ?Result Value Ref Range  ? Glucose-Capillary 205 (H) 70 - 99 mg/dL  ? Comment 1 Notify RN   ? Comment 2 Document in Chart   ?Surgical pcr screen     Status: None  ? Collection Time: 09/21/21 10:58 AM  ? Specimen: Nasal Mucosa; Nasal Swab  ?Result Value Ref Range  ? MRSA, PCR NEGATIVE NEGATIVE  ? Staphylococcus aureus NEGATIVE NEGATIVE  ?Glucose, capillary     Status: Abnormal  ? Collection Time: 09/21/21  12:42 PM  ?Result Value Ref Range  ? Glucose-Capillary 207 (H) 70 - 99 mg/dL  ?Glucose, capillary     Status: Abnormal  ? Collection Time: 09/21/21  2:12 PM  ?Result Value Ref Range  ? Glucose-Capillary 181 (H) 70

## 2021-09-21 NOTE — Assessment & Plan Note (Signed)
-   no s/s exacerbation 

## 2021-09-21 NOTE — Assessment & Plan Note (Addendum)
-   ulceration noted on xray and MRI; no evidence of OM or abscess ?- s/p vanc on admission ?- continue on vanc/rocephin/flagyl (pharmacy has reviewed allergy profile with patient as well; has tolerated cephalosporin in the past) ?- s/p I&D on 3/14 with podiatry and angiogram with vascular surgery on 3/15 ?-Discharged with clindamycin to complete course (did not tolerate doxy previously and previous MRSA is resistant to bactrim) ?

## 2021-09-21 NOTE — Hospital Course (Addendum)
[name] is a 51 yo female with PMH PVD, HTN, HLD, DMII, COPD, asthma, CVA, GERD, depression/anxiety, tobacco abuse, seizure d/o, anemia, CAD, CKD3a, dCHF, right breast cancer (s/p mastectomy) who presented with a left foot ulcer/infection and B/L lower leg pain.  ?She has been followed outpatient by podiatry and was recently on doxycycline but states she feels that she developed a rash on her lower abdomen and inner thighs. ?Due to some drainage from her left heel ulcer and worsening pain, she presented for further evaluation. ?She underwent work-up on admission.  Left lower extremity duplex negative for DVT.  Left foot x-ray showed soft tissue ulceration on the posterior heel.  MRI left foot and heel showed heel ulceration but no drainable abscess or evidence of osteomyelitis. ?She underwent surgical debridement with podiatry on 09/21/2021.  She then underwent abdominal aortogram and left lower extremity angiogram with vascular surgery on 09/22/2021. ?

## 2021-09-21 NOTE — Anesthesia Procedure Notes (Signed)
Procedure Name: Intubation ?Date/Time: 09/21/2021 1:01 PM ?Performed by: Daryel Gerald, RN ?Pre-anesthesia Checklist: Patient identified, Patient being monitored, Timeout performed, Emergency Drugs available and Suction available ?Patient Re-evaluated:Patient Re-evaluated prior to induction ?Oxygen Delivery Method: Circle system utilized ?Preoxygenation: Pre-oxygenation with 100% oxygen ?Induction Type: IV induction ?Ventilation: Mask ventilation without difficulty ?Laryngoscope Size: 3 and McGraph ?Grade View: Grade I ?Tube type: Oral ?Tube size: 7.0 mm ?Number of attempts: 1 ?Airway Equipment and Method: Stylet ?Placement Confirmation: ETT inserted through vocal cords under direct vision, positive ETCO2 and breath sounds checked- equal and bilateral ?Secured at: 20 cm ?Tube secured with: Tape ?Dental Injury: Teeth and Oropharynx as per pre-operative assessment  ? ? ? ? ?

## 2021-09-21 NOTE — Assessment & Plan Note (Addendum)
-   Continue current insulin regimen for now and will modify as necessary ?- discharged on Lantus and Novolog SSI ?- A1c 10.1% ?- Will not resume U-500 at discharge and instead will likely send with Lantus or equivalent.  I have discussed this with patient and husband at bedside regarding her need for insulin change and also making sure she is administering appropriately.  Previously had been injecting around abdominal scar ?

## 2021-09-21 NOTE — Assessment & Plan Note (Addendum)
-   Spironolactone appears to be only antihypertensive noted on med rec ?- Blood pressure currently stable without need for medication ?

## 2021-09-21 NOTE — Anesthesia Postprocedure Evaluation (Signed)
Anesthesia Post Note ? ?Patient: [name] ? ?Procedure(s) Performed: INCISION AND DRAINAGE (Bilateral) ?GRAFT APPLICATION ? ?Patient location during evaluation: PACU ?Anesthesia Type: General ?Level of consciousness: awake and alert ?Pain management: pain level controlled ?Vital Signs Assessment: post-procedure vital signs reviewed and stable ?Respiratory status: spontaneous breathing, nonlabored ventilation, respiratory function stable and patient connected to nasal cannula oxygen ?Cardiovascular status: blood pressure returned to baseline and stable ?Postop Assessment: no apparent nausea or vomiting ?Anesthetic complications: no ? ? ?No notable events documented. ? ? ?Last Vitals:  ?Vitals:  ? 09/21/21 1500 09/21/21 1504  ?BP: (!) 116/57 (!) 116/57  ?Pulse: 77 76  ?Resp: 10 14  ?Temp:    ?SpO2: 93% 95%  ?  ?Last Pain:  ?Vitals:  ? 09/21/21 1500  ?TempSrc:   ?PainSc: 0-No pain  ? ? ?  ?  ?  ?  ?  ?  ? ?Arita Miss ? ? ? ? ?

## 2021-09-21 NOTE — Brief Op Note (Signed)
09/21/2021 ? ?2:16 PM ? ?PATIENT:  [name]  51 y.o. female ? ?PRE-OPERATIVE DIAGNOSIS:  Ulcers bilateral lower extremities ? ?POST-OPERATIVE DIAGNOSIS:  Ulcers bilateral lower extremities ? ?PROCEDURE:  Procedure(s): ?INCISION AND DRAINAGE (Bilateral) ?GRAFT APPLICATION ? ?SURGEON:  Surgeon(s) and Role: ?   [name], DPM - Primary ? ?PHYSICIAN ASSISTANT:  ? ?ASSISTANTS: none  ? ?ANESTHESIA:   general ? ?EBL:  25 mL  ? ?BLOOD ADMINISTERED:none ? ?DRAINS: none  ? ?LOCAL MEDICATIONS USED:  NONE ? ?SPECIMEN:  No Specimen ? ?DISPOSITION OF SPECIMEN:  N/A ? ?COUNTS:  YES ? ?TOURNIQUET:  * No tourniquets in log * ? ?DICTATION: .Dragon Dictation ? ?PLAN OF CARE: Admit to inpatient  ? ?PATIENT DISPOSITION:  PACU - hemodynamically stable. ?  ?Delay start of Pharmacological VTE agent (>24hrs) due to surgical blood loss or risk of bleeding: no ? ?

## 2021-09-21 NOTE — H&P (View-Only) (Signed)
?[name] ?Vascular Consult Note ? ?MRN : [id number] ? ?[name] is a 51 y.o. (Oct 13, 1970) female who presents with chief complaint of  ?Chief Complaint  ?Patient presents with  ? Leg Swelling  ?. ? ?History of Present Illness: We are asked to consult by Dr. Blaine Hamper for multiple lower extremity ulcerations.  [name] is a 51 year old female that presents today for worsening of wounds on her left heel and the dorsal surface of her left foot.  There is also an ulceration noted on her proximal right lower extremity at the lateral edge below the knee.  The patient has a history of tobacco usage with uncontrolled diabetes mellitus.  It is also notable for hypertension, hyperlipidemia, stroke, and COPD.  The patient had an ABI study only ordered by her podiatrist on 09/16/2021 which revealed normal ABIs with multiphasic waveforms.  She was also negative for any DVTs.  Initially the patient had extensive cellulitis bilaterally.  She has been on antibiotics and this is improved in appearance today.  The patient recently had an MRI done and no evidence of osteomyelitis was in the left lower extremity. ? ?Current Facility-Administered Medications  ?Medication Dose Route Frequency Provider Last Rate Last Admin  ? acetaminophen (OFIRMEV) IV 1,000 mg  1,000 mg Intravenous Once PRN Arita Miss, MD      ? Doug Sou Hold] acetaminophen (TYLENOL) tablet 650 mg  650 mg Oral Q6H PRN Ivor Costa, MD      ? Doug Sou Hold] albuterol (PROVENTIL) (2.5 MG/3ML) 0.083% nebulizer solution 2.5 mg  2.5 mg Inhalation Q4H PRN Ivor Costa, MD      ? Doug Sou Hold] ALPRAZolam Duanne Moron) tablet 1 mg  1 mg Oral QID PRN Ivor Costa, MD   1 mg at 09/21/21 1132  ? [MAR Hold] anastrozole (ARIMIDEX) tablet 1 mg  1 mg Oral Daily Ivor Costa, MD   1 mg at 09/21/21 1000  ? [MAR Hold] aspirin EC tablet 81 mg  81 mg Oral QAC breakfast Ivor Costa, MD      ? Doug Sou Hold] busPIRone (BUSPAR) tablet 30 mg  30 mg Oral BID Ivor Costa, MD   30 mg at 09/21/21  1000  ? [MAR Hold] ceFEPIme (MAXIPIME) 2 g in sodium chloride 0.9 % 100 mL IVPB  2 g Intravenous Lenise Arena, MD   2 g at 09/21/21 1325  ? [MAR Hold] citalopram (CELEXA) tablet 40 mg  40 mg Oral Daily Ivor Costa, MD   40 mg at 09/21/21 1000  ? [MAR Hold] dextromethorphan-guaiFENesin (MUCINEX DM) 30-600 MG per 12 hr tablet 1 tablet  1 tablet Oral BID PRN Ivor Costa, MD   1 tablet at 09/21/21 1103  ? fentaNYL (SUBLIMAZE) injection 25-50 mcg  25-50 mcg Intravenous Q5 min PRN Arita Miss, MD      ? Doug Sou Hold] gentamicin cream (GARAMYCIN) 0.1 % 1 application.  1 application. Topical Daily Ivor Costa, MD   1 application. at 09/21/21 1000  ? [MAR Hold] heparin injection 5,000 Units  5,000 Units Subcutaneous Q8H Ivor Costa, MD   5,000 Units at 09/20/21 1715  ? [MAR Hold] hydrALAZINE (APRESOLINE) injection 5 mg  5 mg Intravenous Q2H PRN Ivor Costa, MD      ? Doug Sou Hold] insulin aspart (novoLOG) injection 0-5 Units  0-5 Units Subcutaneous QHS Ivor Costa, MD   3 Units at 09/20/21 2201  ? [MAR Hold] insulin aspart (novoLOG) injection 0-9 Units  0-9 Units Subcutaneous TID WC Ivor Costa, MD   3  Units at 09/21/21 1226  ? [MAR Hold] insulin glargine-yfgn (SEMGLEE) injection 10 Units  10 Units Subcutaneous QHS Ivor Costa, MD   10 Units at 09/20/21 2201  ? [MAR Hold] metroNIDAZOLE (FLAGYL) IVPB 500 mg  500 mg Intravenous Eddie Candle, MD 100 mL/hr at 09/21/21 1030 500 mg at 09/21/21 1030  ? [MAR Hold] mometasone-formoterol (DULERA) 100-5 MCG/ACT inhaler 2 puff  2 puff Inhalation BID Ivor Costa, MD   2 puff at 09/21/21 0819  ? [MAR Hold] mupirocin ointment (BACTROBAN) 2 % 1 application.  1 application. Topical BID Ivor Costa, MD   1 application. at 09/21/21 1000  ? [MAR Hold] nicotine (NICODERM CQ - dosed in mg/24 hours) patch 21 mg  21 mg Transdermal Daily Ivor Costa, MD   21 mg at 09/21/21 0935  ? [MAR Hold] ondansetron (ZOFRAN) injection 4 mg  4 mg Intravenous Q8H PRN Ivor Costa, MD      ? Doug Sou Hold]  oxyCODONE-acetaminophen (PERCOCET/ROXICET) 5-325 MG per tablet 1 tablet  1 tablet Oral Q4H PRN Ivor Costa, MD   1 tablet at 09/21/21 0867  ? [MAR Hold] pregabalin (LYRICA) capsule 300 mg  300 mg Oral BID Ivor Costa, MD   300 mg at 09/21/21 1000  ? [MAR Hold] rOPINIRole (REQUIP) tablet 1 mg  1 mg Oral QHS Ivor Costa, MD   1 mg at 09/20/21 2154  ? [MAR Hold] rosuvastatin (CRESTOR) tablet 20 mg  20 mg Oral Daily Ivor Costa, MD   20 mg at 09/21/21 1000  ? [MAR Hold] traZODone (DESYREL) tablet 200 mg  200 mg Oral QHS Ivor Costa, MD   200 mg at 09/20/21 2153  ? [MAR Hold] venlafaxine XR (EFFEXOR-XR) 24 hr capsule 150 mg  150 mg Oral Q breakfast Ivor Costa, MD   150 mg at 09/21/21 6195  ? ? ?Past Medical History:  ?Diagnosis Date  ? Anxiety   ? takes Prozac daily  ? Anxiety   ? Aortic valve calcification   ? Asthma   ? Advair and Spirva daily  ? Asthma   ? Bipolar disorder ([place])   ? Breast cancer, right breast ([place]) 12/23/2019  ? CAD (coronary artery disease)   ? a. LHC 11/2013 done for CP/fluid retention: mild disease in prox LAD, mild-mod disease in mRCA, EF 60% with normal LVEDP. b. Normal nuc 03/2016.  ? Cancer [facility])   ? Cellulitis and abscess of trunk 03/12/2020  ? CHF (congestive heart failure) ([place])   ? Chronic diastolic CHF (congestive heart failure) ([place])   ? Chronic heart failure with preserved ejection fraction ([place]) 11/16/2018  ? CKD (chronic kidney disease), stage II   ? COPD (chronic obstructive pulmonary disease) ([place])   ? a. nocturnal O2.  ? COPD (chronic obstructive pulmonary disease) ([place])   ? Coronary artery disease   ? Decreased urine stream   ? Diabetes mellitus   ? Diabetes mellitus without complication ([place])   ? Dyspnea   ? Elevated C-reactive protein (CRP) 01/29/2018  ? Elevated sedimentation rate 01/29/2018  ? Elevated serum hCG 01/19/2018  ? Family history of adverse reaction to anesthesia   ? mom gets nauseated  ? Foot pain, bilateral 05/22/2018  ? GERD (gastroesophageal reflux disease)   ? takes Pepcid  daily  ? History of blood clots   ? left leg 3-47yr ago  ? Hyperlipidemia   ? Hypertension   ? Hypertriglyceridemia   ? Inguinal hernia, left 01/2015  ? Muscle spasm   ? Open wound of  genital labia   ? Peripheral neuropathy   ? RBBB   ? Seizures ([place])   ? Sepsis ([place]) 01/19/2018  ? Sinus tachycardia   ? a. persistent since 2009.  ? Smokers' cough ([place])   ? Status post mastectomy, right 01/14/2020  ? Stroke [facility]) 1989  ? left sided weakness  ? TIA (transient ischemic attack)   ? Tobacco abuse   ? Vulvar abscess 01/23/2018  ? ? ?Past Surgical History:  ?Procedure Laterality Date  ? APPLICATION OF WOUND VAC Right 01/29/2020  ? Procedure: APPLICATION OF WOUND VAC;  Surgeon: Ronny Bacon, MD;  Location: [facility] ORS;  Service: General;  Laterality: Right;  ? BREAST BIOPSY Right 11/06/2019  ? [place] core path pending venus clip  ? HERNIA REPAIR Left   ? INCISION AND DRAINAGE ABSCESS N/A 01/26/2018  ? Procedure: INCISION AND DEBRIDEMENT OF VULVAR NECROTIZING SOFT TISSUE INFECTION;  Surgeon: Greer Pickerel, MD;  Location: [place];  Service: General;  Laterality: N/A;  ? INCISION AND DRAINAGE ABSCESS N/A 07/21/2021  ? Procedure: INCISION AND DRAINAGE ABSCESS Scalp;  Surgeon: Robert Bellow, MD;  Location: [facility] OR[facility];  Service: General;  Laterality: N/A;  scalp and right abdomen  ? INCISION AND DRAINAGE PERIRECTAL ABSCESS N/A 01/22/2018  ? Procedure: IRRIGATION AND DEBRIDEMENT LABIAL/VULVAR AREA;  Surgeon: Coralie Keens, MD;  Location: [place];  Service: General;  Laterality: N/A;  ? [place] N/A 01/29/2018  ? Procedure: IRRIGATION AND DEBRIDEMENT VULVA;  Surgeon: Excell Seltzer, MD;  Location: [place];  Service: General;  Laterality: N/A;  ? INGUINAL HERNIA REPAIR Left 04/08/2015  ? Procedure: OPEN LEFT INGUINAL HERNIA REPAIR WITH MESH;  Surgeon: Ralene Ok, MD;  Location: [place];  Service: General;  Laterality: Left;  ? INSERTION OF MESH Left 04/08/2015  ? Procedure: INSERTION OF MESH;  Surgeon:  Ralene Ok, MD;  Location: [place];  Service: General;  Laterality: Left;  ? IRRIGATION AND DEBRIDEMENT ABSCESS N/A 07/21/2021  ? Procedure: IRRIGATION AND DEBRIDEMENT ABSCESS abdomen;  Surgeon: Robert Bellow, MD;  Desma Maxim

## 2021-09-21 NOTE — Assessment & Plan Note (Addendum)
-   recent ABIs outpatient; VVS consulted on admission as well ?- underwent angiogram of abdomen and left lower extremity on 09/22/2021 with no significant stenosis nor stenting required ?-Continue aspirin and Crestor ?

## 2021-09-21 NOTE — Progress Notes (Addendum)
Inpatient Diabetes Program Recommendations ? ?AACE/ADA: New Consensus Statement on Inpatient Glycemic Control  ? ?Target Ranges:  Prepandial:   less than 140 mg/dL ?     Peak postprandial:   less than 180 mg/dL (1-2 hours) ?     Critically ill patients:  140 - 180 mg/dL  ? ? Latest Reference Range & Units 09/20/21 17:03 09/20/21 21:40  ?Glucose-Capillary 70 - 99 mg/dL 293 (H) 277 (H)  ? ? Latest Reference Range & Units 09/20/21 14:26 09/21/21 03:10  ?Glucose 70 - 99 mg/dL 377 (H) 253 (H)  ? ? Latest Reference Range & Units 12/01/20 12:15  ?Hemoglobin A1C 4.0 - 5.6 % 10.6 !  ? ?Review of Glycemic Control ? ?Diabetes history: DM2 ?Outpatient Diabetes medications: Humulin R U500 30 units BID with meals (not taking), Synjardy 25-1000 mg daily ?Current orders for Inpatient glycemic control: Semglee 10 units QHS, Novolog 0-9 units TID with meals, Novolog 0-5 units QHS ? ?Inpatient Diabetes Program Recommendations:   ? ?Insulin: Please consider increasing Semglee to 10 units BID (to start this am). ? ?HbgA1C: Please consider ordering an A1C to evaluate glycemic control over the past 2-3 months. ? ?NOTE: Per chart, patient admitted with left foot ulcer and cellulitis. Initial lab glucose 377 mg/dl on 09/20/21 at 14:26. Noted in Care Everywhere that patient sees Dr. Honor Foster [facility]) and was last seen 12/03/20. Per office note on 12/03/20, patient was instructed to take: ?Let's add back a different version of the Xidguo I gave you last year. ?See if the other one (Synjardy XR) is cheaper. If it is, take one daily. ? ?If not, you'll need to just stick with the insulin. ?Let's have you take 100 units twice a day (breakfast and supper) no matter if you check your sugar or not. ?If you check ahead of the meal and your sugar is high, increase the dose. ?If 200-250 take 150 instead of 100 ?If 251-300 take 200 instead of 100 ?If 301-350 take 250 instead of 100 ?If 351-400 take 300 instead of 100 ?If over 400 take 400.   ? ?Then noted a telephone note on 03/03/21, patient's palliative care nurse had fax Dr. Sherren Foster office to let him know that patient was not taking insulin due to fear of hypoglycemia and glucose was now high.Per home medication list in chart currently, patient is not taking Humulin R U500. Will plan to follow up with patient regarding DM control. ? ?Addendum 09/21/21'@10'$ :40-Spoke with patient regarding DM control. Patient states that she only takes Synjardy daily for DM control. She has been taking Humulin R U500 insulin but states that her surgeon (Dr. Bary Foster) told her that she needs to get on something other than insulin for diabetes after she has an abdominal abscess that required I&D on 07/21/21. Therefore, patient states she has not used any insulin for about 1 month. Patient states that she checks glucose 2-3 times per day and that it is usually in the 200's and she is comfortable with CBGs in 200's. Patient reports that if her glucose gets less than 200 mg/dl she begins to get symptoms of hypoglycemia. Patient confirms that she last seen Dr. Honor Foster in May 2022 and she states she has an appointment already scheduled with him for 10/07/21 to discuss using other DM medications. Patient states that she is willing to get insulin while inpatient but only in her arms. Discussed glucose and A1C goals.  Explained how hyperglycemia leads to damage within blood vessels which lead to the common  complications seen with uncontrolled diabetes. Stressed to the patient the importance of improving glycemic control to prevent further complications from uncontrolled diabetes and to ensure healing of wounds. Discussed impact of nutrition, exercise, stress, sickness, and medications on diabetes control.  Explained that glucose needs to be better controlled to promote healing. Encouraged patient not to miss appointment with Dr. Honor Foster.   Patient verbalized understanding of information discussed and reports no further  questions at this time related to diabetes. ? ?Thanks, ?Barnie Alderman, RN, MSN, CDE ?Diabetes Coordinator ?Inpatient Diabetes Program ?[phone] (Team Pager from 8am to 5pm) ? ?

## 2021-09-21 NOTE — Progress Notes (Signed)
? ?  PODIATRY PREOP PROGRESS NOTE ? ?NAME [name] ?MRN [id number] ?DOB [birth year] ?DOA 09/20/2021  ? ? ?CBC Latest Ref Rng & Units 09/21/2021 09/20/2021 09/16/2021  ?WBC 4.0 - 10.5 K/uL 7.8 8.5 8.8  ?Hemoglobin 12.0 - 15.0 g/dL 11.1(L) 11.9(L) 12.2  ?Hematocrit 36.0 - 46.0 % 35.5(L) 39.1 38.0  ?Platelets 150 - 400 K/uL 203 219 200  ? ? ?BMP Latest Ref Rng & Units 09/21/2021 09/20/2021 09/10/2021  ?Glucose 70 - 99 mg/dL 253(H) 377(H) 353(H)  ?BUN 6 - 20 mg/dL 33(H) 29(H) 36(H)  ?Creatinine 0.44 - 1.00 mg/dL 1.07(H) 1.34(H) 1.03(H)  ?BUN/Creat Ratio 6 - 22 (calc) - - -  ?Sodium 135 - 145 mmol/L 138 133(L) 131(L)  ?Potassium 3.5 - 5.1 mmol/L 3.9 3.7 4.2  ?Chloride 98 - 111 mmol/L 102 97(L) 98  ?CO2 22 - 32 mmol/L '28 28 26  '$ ?Calcium 8.9 - 10.3 mg/dL 8.9 9.1 8.9  ? ?Physical Exam: ?General: The patient is alert and oriented x3 in no acute distress.  ? ?Dermatology: Ulcers bilateral lower extremities.  Please see imaging under 'Media' of the patient's chart ? ?Vascular: Skin warm to touch. ? ?Neurological: Epicritic and protective threshold diminished bilaterally.  ? ?Musculoskeletal Exam: No structural deformity noted. ? ? ? ?ASSESSMENT/PLAN OF CARE ?Ulcers bilateral lower extremities secondary to DM ?-Evaluated the patient preop and discussed the planned surgical debridement of the bilateral heel ulcers.  MRI results were reviewed with the patient.  No osseous involvement.  Explained that the surgery today will consist of simple debridement with application of wound graft.  Patient agrees.  All patient questions answered.  No guarantees expressed or implied. ?-Surgical consent was reviewed and signed ? ?  ?[name], DPM ?[place] ? ?Dr. [name], DPM  ?  ?[address]                                        ?[place], [place] [address]                ?Office [phone]  ?Fax [phone] ? ? ? ? ?

## 2021-09-21 NOTE — Consult Note (Signed)
Pharmacy Antibiotic Note ? ?[name] is a 51 y.o. female admitted on 09/20/2021 with  bilateral ulcers on lower extremities .  Pharmacy has been consulted for Vancomycin and Cefepime dosing. Of note, spoke with patient regarding allergy to PCN and she stated that she's tolerated cefepime and other cephalosporins in the past so is okay with being prescribed these. Also receiving Metronidazole Q12 hours. ? ?Plan: ?Patient received Vancomycin 1g IV in the ED. Will order additional 1g IV to equal loading dose of 2g total, followed by: ?Vancomycin 1250 mg IV Q 24 hrs. Goal AUC 400-550. ?Expected AUC: 494.4 ?Expected Css: 11.7 ?SCr used: 1.07, Vd used: 0.5 ? ?Cefepime 2g IV Q8 hours ? ? ?Height: '5\' 4"'$  (162.6 cm) ?Weight: 104.3 kg (230 lb) ?IBW/kg (Calculated) : 54.7 ? ?Temp (24hrs), Avg:97.9 ?F (36.6 ?C), Min:97.6 ?F (36.4 ?C), Max:98.2 ?F (36.8 ?C) ? ?Recent Labs  ?Lab 09/16/21 ?4580 09/20/21 ?1426 09/21/21 ?0310  ?WBC 8.8 8.5 7.8  ?CREATININE  --  1.34* 1.07*  ?LATICACIDVEN  --  1.2  --   ?  ?Estimated Creatinine Clearance: 72.3 mL/min (A) (by C-G formula based on SCr of 1.07 mg/dL (H)).   ? ?Allergies  ?Allergen Reactions  ? Adhesive [Tape] Rash and Other (See Comments)  ?  TAKES OFF THE SKIN (CERTAIN MEDICAL TAPES DO THIS!!)  ? Metoprolol Shortness Of Breath  ?  Occurrence of shortness of breath after 3 days  ? Montelukast Shortness Of Breath  ? Morphine Sulfate Anaphylaxis, Shortness Of Breath and Nausea And Vomiting  ?  Swollen Throat - Able to tolerate dilaudid  ? Penicillins Anaphylaxis, Hives and Shortness Of Breath  ?  Throat swells ?Has patient had a PCN reaction causing immediate rash, facial/tongue/throat swelling, SOB or lightheadedness with hypotension: Yes ?Has patient had a PCN reaction causing severe rash involving mucus membranes or skin necrosis: No ?Has patient had a PCN reaction that required hospitalization: Yes ?Has patient had a PCN reaction occurring within the last 10 years: No ?If all of  the above answers are "NO", then may proceed with Cephalosporin use. ?  ? Silicone Rash  ?  Takes off the skin (certain medical tapes do this)  ? Diltiazem Swelling  ? Doxycycline Hives  ? Gabapentin Swelling  ? Midodrine   ?  Lightheaded and falling down  ? ? ?Antimicrobials this admission: ?Vancomycin 3/13 >>  ?CFP 3/14 >>  ?MTZ 3/14 >> ? ?Dose adjustments this admission: ?None currently ? ?Microbiology results: ?3/13 BCx: NGTD ?3/14 MRSA PCR: negative ? ?Thank you for allowing pharmacy to be a part of this patient?s care. ? ?Pearla Dubonnet ?09/21/2021 2:35 PM ? ?

## 2021-09-21 NOTE — Interval H&P Note (Signed)
History and Physical Interval Note: ? ?09/21/2021 ?12:02 PM ? ?[name]  has presented today for surgery, with the diagnosis of Ulcer-A Cell.  The various methods of treatment have been discussed with the patient and family. After consideration of risks, benefits and other options for treatment, the patient has consented to  Procedure(s): ?INCISION AND DRAINAGE (Bilateral) as a surgical intervention.  The patient's history has been reviewed, patient examined, no change in status, stable for surgery.  I have reviewed the patient's chart and labs.  Questions were answered to the patient's satisfaction.   ? ? ?[name] ? ? ?

## 2021-09-21 NOTE — Assessment & Plan Note (Signed)
Continue Crestor 

## 2021-09-21 NOTE — Assessment & Plan Note (Signed)
-  patient has history of CKD3a. Baseline creat ~ 1 - 1.1, eGFR 50 ? ?

## 2021-09-21 NOTE — Assessment & Plan Note (Addendum)
-   Continue anastrozole ?- s/p right mastectomy  ?

## 2021-09-21 NOTE — Consult Note (Addendum)
?[facility] ?Vascular Consult Note ? ?MRN : [id number] ? ?[name] is a 51 y.o. (1971/05/01) female who presents with chief complaint of  ?Chief Complaint  ?Patient presents with  ? Leg Swelling  ?. ? ?History of Present Illness: We are asked to consult by Dr. Blaine Hamper for multiple lower extremity ulcerations.  [name] is a 51 year old female that presents today for worsening of wounds on her left heel and the dorsal surface of her left foot.  There is also an ulceration noted on her proximal right lower extremity at the lateral edge below the knee.  The patient has a history of tobacco usage with uncontrolled diabetes mellitus.  It is also notable for hypertension, hyperlipidemia, stroke, and COPD.  The patient had an ABI study only ordered by her podiatrist on 09/16/2021 which revealed normal ABIs with multiphasic waveforms.  She was also negative for any DVTs.  Initially the patient had extensive cellulitis bilaterally.  She has been on antibiotics and this is improved in appearance today.  The patient recently had an MRI done and no evidence of osteomyelitis was in the left lower extremity. ? ?Current Facility-Administered Medications  ?Medication Dose Route Frequency Provider Last Rate Last Admin  ? acetaminophen (OFIRMEV) IV 1,000 mg  1,000 mg Intravenous Once PRN Arita Miss, MD      ? Doug Sou Hold] acetaminophen (TYLENOL) tablet 650 mg  650 mg Oral Q6H PRN Ivor Costa, MD      ? Doug Sou Hold] albuterol (PROVENTIL) (2.5 MG/3ML) 0.083% nebulizer solution 2.5 mg  2.5 mg Inhalation Q4H PRN Ivor Costa, MD      ? Doug Sou Hold] ALPRAZolam Duanne Moron) tablet 1 mg  1 mg Oral QID PRN Ivor Costa, MD   1 mg at 09/21/21 1132  ? [MAR Hold] anastrozole (ARIMIDEX) tablet 1 mg  1 mg Oral Daily Ivor Costa, MD   1 mg at 09/21/21 1000  ? [MAR Hold] aspirin EC tablet 81 mg  81 mg Oral QAC breakfast Ivor Costa, MD      ? Doug Sou Hold] busPIRone (BUSPAR) tablet 30 mg  30 mg Oral BID Ivor Costa, MD   30 mg at 09/21/21  1000  ? [MAR Hold] ceFEPIme (MAXIPIME) 2 g in sodium chloride 0.9 % 100 mL IVPB  2 g Intravenous Lenise Arena, MD   2 g at 09/21/21 1325  ? [MAR Hold] citalopram (CELEXA) tablet 40 mg  40 mg Oral Daily Ivor Costa, MD   40 mg at 09/21/21 1000  ? [MAR Hold] dextromethorphan-guaiFENesin (MUCINEX DM) 30-600 MG per 12 hr tablet 1 tablet  1 tablet Oral BID PRN Ivor Costa, MD   1 tablet at 09/21/21 1103  ? fentaNYL (SUBLIMAZE) injection 25-50 mcg  25-50 mcg Intravenous Q5 min PRN Arita Miss, MD      ? Doug Sou Hold] gentamicin cream (GARAMYCIN) 0.1 % 1 application.  1 application. Topical Daily Ivor Costa, MD   1 application. at 09/21/21 1000  ? [MAR Hold] heparin injection 5,000 Units  5,000 Units Subcutaneous Q8H Ivor Costa, MD   5,000 Units at 09/20/21 1715  ? [MAR Hold] hydrALAZINE (APRESOLINE) injection 5 mg  5 mg Intravenous Q2H PRN Ivor Costa, MD      ? Doug Sou Hold] insulin aspart (novoLOG) injection 0-5 Units  0-5 Units Subcutaneous QHS Ivor Costa, MD   3 Units at 09/20/21 2201  ? [MAR Hold] insulin aspart (novoLOG) injection 0-9 Units  0-9 Units Subcutaneous TID WC Ivor Costa, MD   3  Units at 09/21/21 1226  ? [MAR Hold] insulin glargine-yfgn (SEMGLEE) injection 10 Units  10 Units Subcutaneous QHS Ivor Costa, MD   10 Units at 09/20/21 2201  ? [MAR Hold] metroNIDAZOLE (FLAGYL) IVPB 500 mg  500 mg Intravenous Eddie Candle, MD 100 mL/hr at 09/21/21 1030 500 mg at 09/21/21 1030  ? [MAR Hold] mometasone-formoterol (DULERA) 100-5 MCG/ACT inhaler 2 puff  2 puff Inhalation BID Ivor Costa, MD   2 puff at 09/21/21 0819  ? [MAR Hold] mupirocin ointment (BACTROBAN) 2 % 1 application.  1 application. Topical BID Ivor Costa, MD   1 application. at 09/21/21 1000  ? [MAR Hold] nicotine (NICODERM CQ - dosed in mg/24 hours) patch 21 mg  21 mg Transdermal Daily Ivor Costa, MD   21 mg at 09/21/21 0935  ? [MAR Hold] ondansetron (ZOFRAN) injection 4 mg  4 mg Intravenous Q8H PRN Ivor Costa, MD      ? Doug Sou Hold]  oxyCODONE-acetaminophen (PERCOCET/ROXICET) 5-325 MG per tablet 1 tablet  1 tablet Oral Q4H PRN Ivor Costa, MD   1 tablet at 09/21/21 0272  ? [MAR Hold] pregabalin (LYRICA) capsule 300 mg  300 mg Oral BID Ivor Costa, MD   300 mg at 09/21/21 1000  ? [MAR Hold] rOPINIRole (REQUIP) tablet 1 mg  1 mg Oral QHS Ivor Costa, MD   1 mg at 09/20/21 2154  ? [MAR Hold] rosuvastatin (CRESTOR) tablet 20 mg  20 mg Oral Daily Ivor Costa, MD   20 mg at 09/21/21 1000  ? [MAR Hold] traZODone (DESYREL) tablet 200 mg  200 mg Oral QHS Ivor Costa, MD   200 mg at 09/20/21 2153  ? [MAR Hold] venlafaxine XR (EFFEXOR-XR) 24 hr capsule 150 mg  150 mg Oral Q breakfast Ivor Costa, MD   150 mg at 09/21/21 5366  ? ? ?Past Medical History:  ?Diagnosis Date  ? Anxiety   ? takes Prozac daily  ? Anxiety   ? Aortic valve calcification   ? Asthma   ? Advair and Spirva daily  ? Asthma   ? Bipolar disorder ([place])   ? Breast cancer, right breast ([place]) 12/23/2019  ? CAD (coronary artery disease)   ? a. LHC 11/2013 done for CP/fluid retention: mild disease in prox LAD, mild-mod disease in mRCA, EF 60% with normal LVEDP. b. Normal nuc 03/2016.  ? Cancer [facility])   ? Cellulitis and abscess of trunk 03/12/2020  ? CHF (congestive heart failure) ([place])   ? Chronic diastolic CHF (congestive heart failure) ([place])   ? Chronic heart failure with preserved ejection fraction ([place]) 11/16/2018  ? CKD (chronic kidney disease), stage II   ? COPD (chronic obstructive pulmonary disease) ([place])   ? a. nocturnal O2.  ? COPD (chronic obstructive pulmonary disease) ([place])   ? Coronary artery disease   ? Decreased urine stream   ? Diabetes mellitus   ? Diabetes mellitus without complication ([place])   ? Dyspnea   ? Elevated C-reactive protein (CRP) 01/29/2018  ? Elevated sedimentation rate 01/29/2018  ? Elevated serum hCG 01/19/2018  ? Family history of adverse reaction to anesthesia   ? mom gets nauseated  ? Foot pain, bilateral 05/22/2018  ? GERD (gastroesophageal reflux disease)   ? takes Pepcid  daily  ? History of blood clots   ? left leg 3-49yr ago  ? Hyperlipidemia   ? Hypertension   ? Hypertriglyceridemia   ? Inguinal hernia, left 01/2015  ? Muscle spasm   ? Open wound of  genital labia   ? Peripheral neuropathy   ? RBBB   ? Seizures ([place])   ? Sepsis ([place]) 01/19/2018  ? Sinus tachycardia   ? a. persistent since 2009.  ? Smokers' cough ([place])   ? Status post mastectomy, right 01/14/2020  ? Stroke [facility]) 1989  ? left sided weakness  ? TIA (transient ischemic attack)   ? Tobacco abuse   ? Vulvar abscess 01/23/2018  ? ? ?Past Surgical History:  ?Procedure Laterality Date  ? APPLICATION OF WOUND VAC Right 01/29/2020  ? Procedure: APPLICATION OF WOUND VAC;  Surgeon: Ronny Bacon, MD;  Location: [facility];  Service: General;  Laterality: Right;  ? BREAST BIOPSY Right 11/06/2019  ? [place] core path pending venus clip  ? HERNIA REPAIR Left   ? INCISION AND DRAINAGE ABSCESS N/A 01/26/2018  ? Procedure: INCISION AND DEBRIDEMENT OF VULVAR NECROTIZING SOFT TISSUE INFECTION;  Surgeon: Greer Pickerel, MD;  Location: [place];  Service: General;  Laterality: N/A;  ? INCISION AND DRAINAGE ABSCESS N/A 07/21/2021  ? Procedure: INCISION AND DRAINAGE ABSCESS Scalp;  Surgeon: Robert Bellow, MD;  Location: [facility];  Service: General;  Laterality: N/A;  scalp and right abdomen  ? INCISION AND DRAINAGE PERIRECTAL ABSCESS N/A 01/22/2018  ? Procedure: IRRIGATION AND DEBRIDEMENT LABIAL/VULVAR AREA;  Surgeon: Coralie Keens, MD;  Location: [place];  Service: General;  Laterality: N/A;  ? [place] N/A 01/29/2018  ? Procedure: IRRIGATION AND DEBRIDEMENT VULVA;  Surgeon: Excell Seltzer, MD;  Location: [place];  Service: General;  Laterality: N/A;  ? INGUINAL HERNIA REPAIR Left 04/08/2015  ? Procedure: OPEN LEFT INGUINAL HERNIA REPAIR WITH MESH;  Surgeon: Ralene Ok, MD;  Location: [place];  Service: General;  Laterality: Left;  ? INSERTION OF MESH Left 04/08/2015  ? Procedure: INSERTION OF MESH;  Surgeon:  Ralene Ok, MD;  Location: [place];  Service: General;  Laterality: Left;  ? IRRIGATION AND DEBRIDEMENT ABSCESS N/A 07/21/2021  ? Procedure: IRRIGATION AND DEBRIDEMENT ABSCESS abdomen;  Surgeon: Robert Bellow, MD;  Desma Maxim

## 2021-09-21 NOTE — Anesthesia Preprocedure Evaluation (Addendum)
Anesthesia Evaluation  ?Patient identified by MRN, date of birth, ID band ?Patient awake ? ? ? ?Reviewed: ?Allergy & Precautions, H&P , NPO status , Patient's Chart, lab work & pertinent test results, reviewed documented beta blocker date and time  ? ?History of Anesthesia Complications ?Negative for: history of anesthetic complications ? ?Airway ?Mallampati: II ? ?TM Distance: >3 FB ?Neck ROM: full ? ? ? Dental ? ?(+) Teeth Intact ?  ?Pulmonary ?shortness of breath and with exertion, asthma , sleep apnea and Continuous Positive Airway Pressure Ventilation , COPD,  COPD inhaler, Current Smoker and Patient abstained from smoking.,  ?  ?Pulmonary exam normal ? ? ? ? ? ? ? Cardiovascular ?Exercise Tolerance: Poor ?hypertension, On Medications ?+ CAD, + Peripheral Vascular Disease, +CHF and + Orthopnea  ?Normal cardiovascular exam+ dysrhythmias  ?Rate:Normal ? ? ?  ?Neuro/Psych ? Headaches, Seizures -,  PSYCHIATRIC DISORDERS Anxiety Depression Bipolar Disorder TIA Neuromuscular disease CVA, Residual Symptoms   ? GI/Hepatic ?Neg liver ROS, GERD  Medicated,  ?Endo/Other  ?diabetes ? Renal/GU ?Renal disease  ?negative genitourinary ?  ?Musculoskeletal ? ? Abdominal ?  ?Peds ? Hematology ?negative hematology ROS ?(+)   ?Anesthesia Other Findings ?Past Medical History: ?No date: Anxiety ?    Comment:  takes Prozac daily ?No date: Anxiety ?No date: Aortic valve calcification ?No date: Asthma ?    Comment:  Advair and Spirva daily ?No date: Asthma ?No date: Bipolar disorder ([place]) ?12/23/2019: Breast cancer, right breast ([place]) ?No date: CAD (coronary artery disease) ?    Comment:  a. LHC 11/2013 done for CP/fluid retention: mild disease  ?             in prox LAD, mild-mod disease in mRCA, EF 60% with normal ?             LVEDP. b. Normal nuc 03/2016. ?No date: Cancer [facility]) ?03/12/2020: Cellulitis and abscess of trunk ?No date: CHF (congestive heart failure) ([place]) ?No date: Chronic diastolic CHF  (congestive heart failure) ([place]) ?11/16/2018: Chronic heart failure with preserved ejection fraction ([place]) ?No date: CKD (chronic kidney disease), stage II ?No date: COPD (chronic obstructive pulmonary disease) ([place]) ?    Comment:  a. nocturnal O2. ?No date: COPD (chronic obstructive pulmonary disease) ([place]) ?No date: Coronary artery disease ?No date: Decreased urine stream ?No date: Diabetes mellitus ?No date: Diabetes mellitus without complication ([place]) ?No date: Dyspnea ?01/29/2018: Elevated C-reactive protein (CRP) ?01/29/2018: Elevated sedimentation rate ?01/19/2018: Elevated serum hCG ?No date: Family history of adverse reaction to anesthesia ?    Comment:  mom gets nauseated ?05/22/2018: Foot pain, bilateral ?No date: GERD (gastroesophageal reflux disease) ?    Comment:  takes Pepcid daily ?No date: History of blood clots ?    Comment:  left leg 3-64yr ago ?No date: Hyperlipidemia ?No date: Hypertension ?No date: Hypertriglyceridemia ?01/2015: Inguinal hernia, left ?No date: Muscle spasm ?No date: Open wound of genital labia ?No date: Peripheral neuropathy ?No date: RBBB ?No date: Seizures ([place] ?01/19/2018: Sepsis ([place] ?No date: Sinus tachycardia ?    Comment:  a. persistent since 2009. ?No date: Smokers' cough ([place] ?01/14/2020: Status post mastectomy, right ?1989: [place] ([place] ?    Comment:  left sided weakness ?No date: TIA (transient ischemic attack) ?No date: Tobacco abuse ?01/23/2018: Vulvar abscess ? ? Reproductive/Obstetrics ?negative OB ROS ? ?  ? ? ? ? ? ? ? ? ? ? ? ? ? ?  ?  ? ? ? ? ? ? ? ? ?Anesthesia Physical ? ?  Anesthesia Plan ? ?ASA: 4 ? ?Anesthesia Plan: General  ? ?Post-op Pain Management: Ofirmev IV (intra-op)*  ? ?Induction: Intravenous ? ?PONV Risk Score and Plan: 4 or greater and Ondansetron, Dexamethasone, Midazolam and Treatment may vary due to age or medical condition ? ?Airway Management Planned: Oral ETT ? ?Additional Equipment: None ? ?Intra-op Plan:  ? ?Post-operative Plan: Extubation in  OR ? ?Informed Consent: I have reviewed the patients History and Physical, chart, labs and discussed the procedure including the risks, benefits and alternatives for the proposed anesthesia with the patient or authorized representative who has indicated his/her understanding and acceptance.  ? ?Patient has DNR.  ?Discussed DNR with patient and Suspend DNR. ?  ?Dental advisory given ? ?Plan Discussed with: CRNA ? ?Anesthesia Plan Comments: (Discussed risks of anesthesia with patient, including PONV, sore throat, lip/dental/eye damage. Rare risks discussed as well, such as cardiorespiratory and neurological sequelae, and allergic reactions. Discussed the role of CRNA in patient's perioperative care. Patient understands. ?Patient counseled on being higher risk for anesthesia due to comorbidities: o2-dependent COPD. Patient was told about increased risk of cardiac and respiratory events, including death. ?)  ? ? ? ? ? ?Anesthesia Quick Evaluation ? ?

## 2021-09-21 NOTE — Assessment & Plan Note (Signed)
-   On Pristiq at home.  Currently on Effexor as substitution ?

## 2021-09-21 NOTE — Transfer of Care (Signed)
Immediate Anesthesia Transfer of Care Note ? ?Patient: [name] ? ?Procedure(s) Performed: INCISION AND DRAINAGE (Bilateral) ?GRAFT APPLICATION ? ?Patient Location: PACU ? ?Anesthesia Type:General ? ?Level of Consciousness: drowsy ? ?Airway & Oxygen Therapy: Patient Spontanous Breathing and Patient connected to face mask oxygen ? ?Post-op Assessment: Report given to RN and Post -op Vital signs reviewed and stable ? ?Post vital signs: Reviewed and stable ? ?Last Vitals:  ?Vitals Value Taken Time  ?BP 120/61 09/21/21 1415  ?Temp 36.6 ?C 09/21/21 1410  ?Pulse 81 09/21/21 1418  ?Resp 14 09/21/21 1418  ?SpO2 97 % 09/21/21 1418  ?Vitals shown include unvalidated device data. ? ?Last Pain:  ?Vitals:  ? 09/21/21 1243  ?TempSrc: Oral  ?PainSc: 2   ?   ? ?Patients Stated Pain Goal: 0 (09/21/21 0800) ? ?Complications: No notable events documented. ?

## 2021-09-21 NOTE — Assessment & Plan Note (Signed)
-   Continue aspirin, Crestor 

## 2021-09-21 NOTE — Assessment & Plan Note (Addendum)
-   multiple recommendations have been made for smoking cessation  ?-Continue nicotine patch ?

## 2021-09-22 ENCOUNTER — Encounter: Payer: Self-pay | Admitting: Vascular Surgery

## 2021-09-22 ENCOUNTER — Encounter
Admission: EM | Disposition: A | Discharge status: Home / Self Care | Payer: Self-pay | Source: Home / Self Care | Attending: Internal Medicine

## 2021-09-22 DIAGNOSIS — F17210 Nicotine dependence, cigarettes, uncomplicated: Secondary | ICD-10-CM

## 2021-09-22 DIAGNOSIS — E11628 Type 2 diabetes mellitus with other skin complications: Secondary | ICD-10-CM | POA: Diagnosis not present

## 2021-09-22 DIAGNOSIS — I70262 Atherosclerosis of native arteries of extremities with gangrene, left leg: Secondary | ICD-10-CM

## 2021-09-22 DIAGNOSIS — L089 Local infection of the skin and subcutaneous tissue, unspecified: Secondary | ICD-10-CM | POA: Diagnosis not present

## 2021-09-22 DIAGNOSIS — E119 Type 2 diabetes mellitus without complications: Secondary | ICD-10-CM | POA: Diagnosis not present

## 2021-09-22 DIAGNOSIS — L03116 Cellulitis of left lower limb: Secondary | ICD-10-CM | POA: Diagnosis not present

## 2021-09-22 LAB — CBC WITH DIFFERENTIAL/PLATELET
Abs Immature Granulocytes: 0.03 10*3/uL (ref 0.00–0.07)
Basophils Absolute: 0.1 10*3/uL (ref 0.0–0.1)
Basophils Relative: 1 %
Eosinophils Absolute: 0.2 10*3/uL (ref 0.0–0.5)
Eosinophils Relative: 3 %
HCT: 33.9 % — ABNORMAL LOW (ref 36.0–46.0)
Hemoglobin: 10.4 g/dL — ABNORMAL LOW (ref 12.0–15.0)
Immature Granulocytes: 0 %
Lymphocytes Relative: 15 %
Lymphs Abs: 1.2 10*3/uL (ref 0.7–4.0)
MCH: 26.7 pg (ref 26.0–34.0)
MCHC: 30.7 g/dL (ref 30.0–36.0)
MCV: 87.1 fL (ref 80.0–100.0)
Monocytes Absolute: 0.6 10*3/uL (ref 0.1–1.0)
Monocytes Relative: 8 %
Neutro Abs: 5.5 10*3/uL (ref 1.7–7.7)
Neutrophils Relative %: 73 %
Platelets: 180 10*3/uL (ref 150–400)
RBC: 3.89 MIL/uL (ref 3.87–5.11)
RDW: 15.1 % (ref 11.5–15.5)
WBC: 7.5 10*3/uL (ref 4.0–10.5)
nRBC: 0 % (ref 0.0–0.2)

## 2021-09-22 LAB — HEMOGLOBIN A1C
Hgb A1c MFr Bld: 10.1 % — ABNORMAL HIGH (ref 4.8–5.6)
Mean Plasma Glucose: 243 mg/dL

## 2021-09-22 LAB — GLUCOSE, CAPILLARY
Glucose-Capillary: 207 mg/dL — ABNORMAL HIGH (ref 70–99)
Glucose-Capillary: 206 mg/dL — ABNORMAL HIGH (ref 70–99)
Glucose-Capillary: 235 mg/dL — ABNORMAL HIGH (ref 70–99)
Glucose-Capillary: 249 mg/dL — ABNORMAL HIGH (ref 70–99)
Glucose-Capillary: 277 mg/dL — ABNORMAL HIGH (ref 70–99)

## 2021-09-22 LAB — BASIC METABOLIC PANEL
Anion gap: 5 (ref 5–15)
BUN: 33 mg/dL — ABNORMAL HIGH (ref 6–20)
CO2: 28 mmol/L (ref 22–32)
Calcium: 8.8 mg/dL — ABNORMAL LOW (ref 8.9–10.3)
Chloride: 104 mmol/L (ref 98–111)
Creatinine, Ser: 1.15 mg/dL — ABNORMAL HIGH (ref 0.44–1.00)
GFR, Estimated: 57 mL/min — ABNORMAL LOW (ref >60)
Glucose, Bld: 195 mg/dL — ABNORMAL HIGH (ref 70–99)
Potassium: 4.2 mmol/L (ref 3.5–5.1)
Sodium: 137 mmol/L (ref 135–145)

## 2021-09-22 LAB — MAGNESIUM: Magnesium: 2.5 mg/dL — ABNORMAL HIGH (ref 1.7–2.4)

## 2021-09-22 SURGERY — LOWER EXTREMITY ANGIOGRAPHY
Anesthesia: Moderate Sedation | Laterality: Left

## 2021-09-22 MED ORDER — ONDANSETRON HCL 4 MG/2ML IJ SOLN: 4.0000 mg | Freq: Four times a day (QID) | INTRAMUSCULAR | Status: DC | PRN

## 2021-09-22 MED ORDER — HEPARIN SODIUM (PORCINE) 1000 UNIT/ML IJ SOLN
INTRAMUSCULAR | Status: AC
Start: 2021-09-22 — End: 2021-09-22
  Filled 2021-09-22: qty 10

## 2021-09-22 MED ORDER — ATORVASTATIN CALCIUM 10 MG PO TABS: 10.0000 mg | ORAL_TABLET | Freq: Every day | ORAL | Status: DC

## 2021-09-22 MED ORDER — MIDAZOLAM HCL 2 MG/2ML IJ SOLN
INTRAMUSCULAR | Status: AC
  Filled 2021-09-22: qty 2

## 2021-09-22 MED ORDER — INSULIN GLARGINE-YFGN 100 UNIT/ML ~~LOC~~ SOLN
20.0000 [IU] | Freq: Every day | SUBCUTANEOUS | Status: DC
  Administered 2021-09-22: 20 [IU] via SUBCUTANEOUS
  Filled 2021-09-22 (×2): qty 0.2

## 2021-09-22 MED ORDER — ASCORBIC ACID 500 MG PO TABS
500.0000 mg | ORAL_TABLET | Freq: Two times a day (BID) | ORAL | Status: DC
  Administered 2021-09-22 – 2021-09-23 (×2): 500 mg via ORAL
  Filled 2021-09-22 (×2): qty 1

## 2021-09-22 MED ORDER — SODIUM CHLORIDE 0.9 % IV SOLN: 250.0000 mL | INTRAVENOUS | Status: DC | PRN

## 2021-09-22 MED ORDER — MIDAZOLAM HCL 2 MG/2ML IJ SOLN
INTRAMUSCULAR | Status: AC
Start: 2021-09-22 — End: 2021-09-22
  Filled 2021-09-22: qty 2

## 2021-09-22 MED ORDER — METHYLPREDNISOLONE SODIUM SUCC 125 MG IJ SOLR: 125.0000 mg | Freq: Once | INTRAMUSCULAR | Status: DC | PRN

## 2021-09-22 MED ORDER — DIPHENHYDRAMINE HCL 50 MG/ML IJ SOLN: 50.0000 mg | Freq: Once | INTRAMUSCULAR | Status: DC | PRN

## 2021-09-22 MED ORDER — FENTANYL CITRATE PF 50 MCG/ML IJ SOSY
PREFILLED_SYRINGE | INTRAMUSCULAR | Status: AC
  Filled 2021-09-22: qty 1

## 2021-09-22 MED ORDER — SODIUM CHLORIDE 0.9 % IV SOLN: INTRAVENOUS | Status: AC

## 2021-09-22 MED ORDER — ADULT MULTIVITAMIN W/MINERALS CH
1.0000 | ORAL_TABLET | Freq: Every day | ORAL | Status: DC
  Administered 2021-09-22 – 2021-09-23 (×2): 1 via ORAL
  Filled 2021-09-22 (×2): qty 1

## 2021-09-22 MED ORDER — SODIUM CHLORIDE 0.9 % IV SOLN: INTRAVENOUS | Status: DC

## 2021-09-22 MED ORDER — FENTANYL CITRATE PF 50 MCG/ML IJ SOSY: 12.5000 ug | PREFILLED_SYRINGE | Freq: Once | INTRAMUSCULAR | Status: DC | PRN

## 2021-09-22 MED ORDER — IODIXANOL 320 MG/ML IV SOLN
INTRAVENOUS | Status: DC | PRN
  Administered 2021-09-22: 55 mL

## 2021-09-22 MED ORDER — FENTANYL CITRATE (PF) 100 MCG/2ML IJ SOLN
INTRAMUSCULAR | Status: DC | PRN
Start: 2021-09-22 — End: 2021-09-22
  Administered 2021-09-22 (×2): 50 ug via INTRAVENOUS

## 2021-09-22 MED ORDER — ZINC SULFATE 220 (50 ZN) MG PO CAPS
220.0000 mg | ORAL_CAPSULE | Freq: Every day | ORAL | Status: DC
Start: 2021-09-22 — End: 2021-09-23
  Administered 2021-09-22 – 2021-09-23 (×2): 220 mg via ORAL
  Filled 2021-09-22 (×2): qty 1

## 2021-09-22 MED ORDER — ACETAMINOPHEN 325 MG PO TABS: 650.0000 mg | ORAL_TABLET | ORAL | Status: DC | PRN

## 2021-09-22 MED ORDER — FAMOTIDINE 20 MG PO TABS: 40.0000 mg | ORAL_TABLET | Freq: Once | ORAL | Status: DC | PRN

## 2021-09-22 MED ORDER — VANCOMYCIN HCL IN DEXTROSE 1-5 GM/200ML-% IV SOLN
1000.0000 mg | INTRAVENOUS | Status: DC
  Administered 2021-09-22: 1000 mg via INTRAVENOUS
  Filled 2021-09-22 (×2): qty 200

## 2021-09-22 MED ORDER — SODIUM CHLORIDE 0.9% FLUSH: 3.0000 mL | INTRAVENOUS | Status: DC | PRN

## 2021-09-22 MED ORDER — MIDAZOLAM HCL 2 MG/ML PO SYRP
8.0000 mg | ORAL_SOLUTION | Freq: Once | ORAL | Status: DC | PRN
  Filled 2021-09-22: qty 4

## 2021-09-22 MED ORDER — MIDAZOLAM HCL 2 MG/2ML IJ SOLN
INTRAMUSCULAR | Status: DC | PRN
  Administered 2021-09-22: 1 mg via INTRAVENOUS
  Administered 2021-09-22: 2 mg via INTRAVENOUS

## 2021-09-22 MED ORDER — SODIUM CHLORIDE 0.9% FLUSH
3.0000 mL | Freq: Two times a day (BID) | INTRAVENOUS | Status: DC
  Administered 2021-09-22 – 2021-09-23 (×2): 3 mL via INTRAVENOUS

## 2021-09-22 SURGICAL SUPPLY — 12 items
CATH ANGIO 5F PIGTAIL 65CM (CATHETERS) ×1 IMPLANT
COVER PROBE U/S 5X48 (MISCELLANEOUS) ×1 IMPLANT
DEVICE STARCLOSE SE CLOSURE (Vascular Products) ×1 IMPLANT
GLIDEWIRE ADV .035X260CM (WIRE) ×1 IMPLANT
NDL ENTRY 21GA 7CM ECHOTIP (NEEDLE) IMPLANT
NEEDLE ENTRY 21GA 7CM ECHOTIP (NEEDLE) ×2 IMPLANT
PACK ANGIOGRAPHY (CUSTOM PROCEDURE TRAY) ×2 IMPLANT
SET INTRO CAPELLA COAXIAL (SET/KITS/TRAYS/PACK) ×1 IMPLANT
SHEATH BRITE TIP 5FRX11 (SHEATH) ×1 IMPLANT
SYR MEDRAD MARK 7 150ML (SYRINGE) ×1 IMPLANT
TUBING CONTRAST HIGH PRESS 72 (TUBING) ×1 IMPLANT
WIRE GUIDERIGHT .035X150 (WIRE) ×1 IMPLANT

## 2021-09-22 NOTE — Progress Notes (Signed)
Inpatient Diabetes Program Recommendations ? ?AACE/ADA: New Consensus Statement on Inpatient Glycemic Control  ? ?Target Ranges:  Prepandial:   less than 140 mg/dL ?     Peak postprandial:   less than 180 mg/dL (1-2 hours) ?     Critically ill patients:  140 - 180 mg/dL  ? ? Latest Reference Range & Units 09/22/21 03:14  ?Glucose 70 - 99 mg/dL 195 (H)  ? ? Latest Reference Range & Units 09/21/21 07:47 09/21/21 10:42 09/21/21 12:42 09/21/21 14:12 09/21/21 17:41 09/21/21 20:42  ?Glucose-Capillary 70 - 99 mg/dL 248 (H) 205 (H) 207 (H) 181 (H) 204 (H) 246 (H)  ? ? Latest Reference Range & Units 09/21/21 03:10  ?Hemoglobin A1C 4.8 - 5.6 % 10.1 (H)  ? ?Review of Glycemic Control ? ?Diabetes history: DM2 ?Outpatient Diabetes medications: Humulin R U500 30 units BID with meals (not taking), Synjardy 25-1000 mg daily ?Current orders for Inpatient glycemic control: Semglee 10 units QHS, Novolog 0-9 units TID with meals, Novolog 0-5 units QHS ? ?Inpatient Diabetes Program Recommendations:   ? ?Insulin: Please consider increasing Semglee to 15 units QHS and ordering Novolog 3 units TID with meals for meal coverage if patient eats at least 50% of meals. ? ?HbgA1C: A1C 10.1% on 09/21/21 indicating an average glucose of 243 mg/dl over the past 2-3 months. Patient is only taking Synjardy outpatient for DM (not taken Humulin R U500 in last month) and has an appointment with Dr. Honor Junes (Endocrinologist) on 10/07/21. Patient will require more than Synjardy for DM control outpatient. ? ?NOTE: Diabetes coordinator spoke with patient on 09/21/21 and she reported she is no longer taking the Humulin R U500 insulin because her surgeon (Dr. Bary Castilla) told her that she needs to get on something other than insulin for diabetes after she had an abdominal abscess that required I&D on 07/21/21.  ? ?Thanks, ?Barnie Alderman, RN, MSN, CDE ?Diabetes Coordinator ?Inpatient Diabetes Program ?[phone] (Team Pager from 8am to 5pm) ? ? ? ?

## 2021-09-22 NOTE — Progress Notes (Signed)
?  Subjective:  ?Patient ID: [name], female    DOB: [birth year],  MRN: [id number] ? ?Doing well having some pain in the foot.  The nurse is present at bedside changing the dressing ? ?Negative for chest pain and shortness of breath ?Fever: no ?Constitutional signs: no ?Review of all other systems is negative ?Objective:  ? ?Vitals:  ? 09/22/21 1000 09/22/21 1022  ?BP: 121/69 130/67  ?Pulse: 69 70  ?Resp: 16 15  ?Temp:  97.8 ?F (36.6 ?C)  ?SpO2: 96% 99%  ? ?General AA&O x3. Normal mood and affect.  ?Vascular Foot is warm and well-perfused  ?Neurologic Epicritic sensation grossly reduced.  ?Dermatologic New dressing is present.  Per nurse and patient to the graft on the dorsal foot was removed  ?Orthopedic: MMT 5/5  ? ?Angiography today with vascular surgery showed patent blood flow in bilateral lower extremities, minor lesions on left lower ?Assessment & Plan:  ?Patient was evaluated and treated and all questions answered. ? ?POD 1 status post debridement and application of ACell skin substitute ?-No dressing changes until office follow-up with Dr. Amalia Hailey leave dry clean and intact ?-WBAT to RLE, NWB to left heel ?-Would cover with p.o. antibiotics postop with MRSA per last culture 07/21/2021, doxycycline should be sufficient ?-Should be able to discharge tomorrow ? ?Criselda Peaches, DPM ? ?Accessible via secure chat for questions or concerns.  ?

## 2021-09-22 NOTE — Discharge Instructions (Signed)
Plate Method for Diabetes  ? ?Foods with carbohydrates make your blood glucose level go up. The plate method is a simple way to meal plan and control the amount of carbohydrate you eat. ?    ?    ?Use the following guidance to build a healthy plate to control carbohydrates. Divide a 9-inch plate into 3 sections, and consider your beverage the 4th section of your meal: ?Food Group Examples of Foods/Beverages for This Section of your Meal  ?Section 1: Non-starchy vegetables ?Fill ? of your plate to include non-starchy vegetables Asparagus, broccoli, brussels sprouts, cabbage, carrots, cauliflower, celery, cucumber, green beans, mushrooms, peppers, salad greens, tomatoes, or zucchini.  ?Section 2: Protein foods ?Fill ? of your plate to include a lean protein Lean meat, poultry, fish, seafood, cheese, eggs, lean deli meat, tofu, beans, lentils, nuts or nut butters.  ?Section 3: Carbohydrate foods ?Fill ? of your plate to include carbohydrate foods Whole grains, whole wheat bread, brown rice, whole grain pasta, polenta, corn tortillas, fruit, or starchy vegetables (potatoes, green peas, corn, beans, acorn squash, and butternut squash). One cup of milk also counts as a food that contains carbohydrate.  ?Section 4: Beverage ?Choose water or a low-calorie drink for your beverage. Unsweetened tea, coffee, or flavored/sparkling water without added sugar.  ?Image reprinted with permission from The American Diabetes Association.  Copyright 2022 by the American Diabetes Association. ? ? ?Copyright 2022 ? Academy of Nutrition and Dietetics. All rights reserved   ? ?Carbohydrate Counting For People With Diabetes ? ?Foods with carbohydrates make your blood glucose level go up. Learning how to count carbohydrates can help you control your blood glucose levels. First, identify the foods you eat that contain carbohydrates. Then, using the Foods with Carbohydrates chart, determine about how much carbohydrates are in your meals and  snacks. Make sure you are eating foods with fiber, protein, and healthy fat along with your carbohydrate foods. ?Foods with Carbohydrates ?The following table shows carbohydrate foods that have about 15 grams of carbohydrate each. Using measuring cups, spoons, or a food scale when you first begin learning about carbohydrate counting can help you learn about the portion sizes you typically eat. ?The following foods have 15 grams carbohydrate each:  ?Grains ?1 slice bread (1 ounce)  ?1 small tortilla (6-inch size)  ?? large bagel (1 ounce)  ?1/3 cup pasta or rice (cooked)  ?? hamburger or hot dog bun (? ounce)  ?? cup cooked cereal  ?? to ? cup ready-to-eat cereal  ?2 taco shells (5-inch size) Fruit ?1 small fresh fruit (? to 1 cup)  ?? medium banana  ?17 small grapes (3 ounces)  ?1 cup melon or berries  ?? cup canned or frozen fruit  ?2 tablespoons dried fruit (blueberries, cherries, cranberries, raisins)  ?? cup unsweetened fruit juice  ?Starchy Vegetables ?? cup cooked beans, peas, corn, potatoes/sweet potatoes  ?? large baked potato (3 ounces)  ?1 cup acorn or butternut squash  Snack Foods ?3 to 6 crackers  ?8 potato chips or 13 tortilla chips (? ounce to 1 ounce)  ?3 cups popped popcorn  ?Dairy ?3/4 cup (6 ounces) nonfat plain yogurt, or yogurt with sugar-free sweetener  ?1 cup milk  ?1 cup plain rice, soy, coconut or flavored almond milk Sweets and Desserts ?? cup ice cream or frozen yogurt  ?1 tablespoon jam, jelly, pancake syrup, table sugar, or honey  ?2 tablespoons light pancake syrup  ?1 inch square of frosted cake or 2 inch square of  unfrosted cake  ?2 small cookies (2/3 ounce each) or ? large cookie  ?Sometimes you?ll have to estimate carbohydrate amounts if you don?t know the exact recipe. One cup of mixed foods like soups can have 1 to 2 carbohydrate servings, while some casseroles might have 2 or more servings of carbohydrate. ?Foods that have less than 20 calories in each serving can be counted as  ?free? foods. Count 1 cup raw vegetables, or ? cup cooked non-starchy vegetables as ?free? foods. If you eat 3 or more servings at one meal, then count them as 1 carbohydrate serving.  ?Foods without Carbohydrates  ?Not all foods contain carbohydrates. Meat, some dairy, fats, non-starchy vegetables, and many beverages don?t contain carbohydrate. So when you count carbohydrates, you can generally exclude chicken, pork, beef, fish, seafood, eggs, tofu, cheese, butter, sour cream, avocado, nuts, seeds, olives, mayonnaise, water, black coffee, unsweetened tea, and zero-calorie drinks. Vegetables with no or low carbohydrate include green beans, cauliflower, tomatoes, and onions. ?How much carbohydrate should I eat at each meal?  ?Carbohydrate counting can help you plan your meals and manage your weight. Following are some starting points for carbohydrate intake at each meal. Work with your registered dietitian nutritionist to find the best range that works for your blood glucose and weight.  ? To Lose Weight To Maintain Weight  ?Women 2 - 3 carb servings 3 - 4 carb servings  ?Men 3 - 4 carb servings 4 - 5 carb servings  ?Checking your blood glucose after meals will help you know if you need to adjust the timing, type, or number of carbohydrate servings in your meal plan. Achieve and keep a healthy body weight by balancing your food intake and physical activity. ? ?Tips ?How should I plan my meals?  ?Plan for half the food on your plate to include non-starchy vegetables, like salad greens, broccoli, or carrots. Try to eat 3 to 5 servings of non-starchy vegetables every day. Have a protein food at each meal. Protein foods include chicken, fish, meat, eggs, or beans (note that beans contain carbohydrate). These two food groups (non-starchy vegetables and proteins) are low in carbohydrate. If you fill up your plate with these foods, you will eat less carbohydrate but still fill up your stomach. Try to limit your carbohydrate  portion to ? of the plate.  ?What fats are healthiest to eat?  ?Diabetes increases risk for heart disease. To help protect your heart, eat more healthy fats, such as olive oil, nuts, and avocado. Eat less saturated fats like butter, cream, and high-fat meats, like bacon and sausage. Avoid trans fats, which are in all foods that list ?partially hydrogenated oil? as an ingredient. ?What should I drink?  ?Choose drinks that are not sweetened with sugar. The healthiest choices are water, carbonated or seltzer waters, and tea and coffee without added sugars.  ?Sweet drinks will make your blood glucose go up very quickly. One serving of soda or energy drink is ? cup. It is best to drink these beverages only if your blood glucose is low.  ?Artificially sweetened, or diet drinks, typically do not increase your blood glucose if they have zero calories in them. Read labels of beverages, as some diet drinks do have carbohydrate and will raise your blood glucose. ?Label Reading Tips ?Read Nutrition Facts labels to find out how many grams of carbohydrate are in a food you want to eat. Don?t forget: sometimes serving sizes on the label aren?t the same as how much food  you are going to eat, so you may need to calculate how much carbohydrate is in the food you are serving yourself.  ? ?Carbohydrate Counting for People with Diabetes Sample 1-Day Menu  ?Breakfast ? cup yogurt, low fat, low sugar (1 carbohydrate serving)  ?? cup cereal, ready-to-eat, unsweetened (1 carbohydrate serving)  ?1 cup strawberries (1 carbohydrate serving)  ?? cup almonds (? carbohydrate serving)  ?Lunch 1, 5 ounce can chunk light tuna  ?2 ounces cheese, low fat cheddar  ?6 whole wheat crackers (1 carbohydrate serving)  ?1 small apple (1? carbohydrate servings)  ?? cup carrots (? carbohydrate serving)  ?? cup snap peas  ?1 cup 1% milk (1 carbohydrate serving)   ?Evening Meal Stir fry made with: 3 ounces chicken  ?1 cup brown rice (3 carbohydrate servings)  ??  cup broccoli (? carbohydrate serving)  ?? cup green beans  ?? cup onions  ?1 tablespoon olive oil  ?2 tablespoons teriyaki sauce (? carbohydrate serving)  ?Evening Snack 1 extra small banana (1 carbohydrat

## 2021-09-22 NOTE — Plan of Care (Signed)
?  Problem: Education: Goal: Knowledge of General Education information will improve Description: Including pain rating scale, medication(s)/side effects and non-pharmacologic comfort measures Outcome: Progressing   Problem: Health Behavior/Discharge Planning: Goal: Ability to manage health-related needs will improve Outcome: Progressing   Problem: Clinical Measurements: Goal: Ability to maintain clinical measurements within normal limits will improve Outcome: Progressing Goal: Will remain free from infection Outcome: Progressing Goal: Diagnostic test results will improve Outcome: Progressing Goal: Cardiovascular complication will be avoided Outcome: Progressing   Problem: Activity: Goal: Risk for activity intolerance will decrease Outcome: Progressing   Problem: Nutrition: Goal: Adequate nutrition will be maintained Outcome: Progressing   Problem: Elimination: Goal: Will not experience complications related to bowel motility Outcome: Progressing Goal: Will not experience complications related to urinary retention Outcome: Progressing   Problem: Pain Managment: Goal: General experience of comfort will improve Outcome: Progressing   Problem: Safety: Goal: Ability to remain free from injury will improve Outcome: Progressing   Problem: Skin Integrity: Goal: Risk for impaired skin integrity will decrease Outcome: Progressing   

## 2021-09-22 NOTE — OR Nursing (Signed)
Dr Delana Meyer aware of morphine allergy, he reported pt got fentanyl before without complications ?

## 2021-09-22 NOTE — Evaluation (Signed)
Physical Therapy Evaluation ?Patient Details ?Name: [name] ?MRN: [id number] ?DOB: August 21, 1970 ?Today's Date: 09/22/2021 ? ?History of Present Illness ? Pt is a 51 yo female with PMH PVD, HTN, HLD, DMII, COPD, asthma, CVA, GERD, depression/anxiety, tobacco abuse, seizure d/o, anemia, CAD, CKD3a, dCHF, right breast cancer (s/p mastectomy) who presented with a left foot ulcer/infection and BLE lower leg pain.  Pt diagnosed with atherosclerotic occlusive disease bilateral lower extremities with gangrenous changes to the left heel. Pt underwent surgical debridement with podiatry on 09/21/2021.  She then underwent abdominal aortogram and left lower extremity angiogram with vascular surgery on 09/22/2021. ?  ?Clinical Impression ? Pt was pleasant and motivated to participate during the session and put forth good effort throughout. Pt required no physical assistance during the session but did require frequent cuing on proper sequencing with transfers and steps to chair for TTWB compliance on the LLE with fair carryover at best.  Pt reported no adverse symptoms during the session with SpO2 and HR WNL on room air. Patient suffers from atherosclerotic occlusive disease with gangrenous changes to the L heel which impairs her ability to perform daily activities like toileting, feeding, dressing, grooming, bathing in the home. A cane, walker, crutch will not resolve the patient's issue with performing activities of daily living. A lightweight wheelchair and cushion is required/recommended and will allow patient to safely perform daily activities. Patient can safely propel the wheelchair in the home or has a caregiver who can provide assistance. Pt will benefit from HHPT upon discharge to safely address deficits listed in patient problem list for decreased caregiver assistance and eventual return to PLOF. ? ? ?   ?   ? ?Recommendations for follow up therapy are one component of a multi-disciplinary discharge planning process, led  by the attending physician.  Recommendations may be updated based on patient status, additional functional criteria and insurance authorization. ? ?Follow Up Recommendations Home health PT ? ?  ?Assistance Recommended at Discharge Intermittent Supervision/Assistance  ?Patient can return home with the following ? A little help with walking and/or transfers;A little help with bathing/dressing/bathroom;Help with stairs or ramp for entrance;Assist for transportation;Assistance with cooking/housework ? ?  ?Equipment Recommendations Rolling walker (2 wheels);BSC/3in1;Wheelchair cushion (measurements PT) (May not required wheelchair once CAM boot arrives)  ?Recommendations for Other Services ?    ?  ?Functional Status Assessment Patient has had a recent decline in their functional status and demonstrates the ability to make significant improvements in function in a reasonable and predictable amount of time.  ? ?  ?Precautions / Restrictions Precautions ?Precautions: Fall ?Restrictions ?Weight Bearing Restrictions: Yes ?Other Position/Activity Restrictions: Per Dr. Amalia Hailey and Dr. Sabino Gasser, pt NWB on L heel but may TTWB for transitions/transfers; once CAM boot received pt may be WBAT on the LLE with CAM boot donned  ? ?  ? ?Mobility ? Bed Mobility ?Overal bed mobility: Independent ?  ?  ?  ?  ?  ?  ?General bed mobility comments: Good speed and effort, no use of rails needed ?  ? ?Transfers ?Overall transfer level: Needs assistance ?Equipment used: Rolling walker (2 wheels) ?Transfers: Sit to/from Stand ?Sit to Stand: Min guard ?  ?  ?  ?  ?  ?General transfer comment: Mod verbal cues for sequencing for WB compliance ?  ? ?Ambulation/Gait ?Ambulation/Gait assistance: Min guard ?Gait Distance (Feet): 3 Feet ?Assistive device: Rolling walker (2 wheels) ?Gait Pattern/deviations: Step-to pattern, Decreased step length - left ?Gait velocity: decreased ?  ?  ?General Gait  Details: Mod verbal and visual cues for proper sequencing for  compliance with TTWB on the LLE ? ?Stairs ?  ?  ?  ?  ?  ? ?Wheelchair Mobility ?  ? ?Modified Rankin (Stroke Patients Only) ?  ? ?  ? ?Balance Overall balance assessment: Needs assistance ?  ?Sitting balance-Leahy Scale: Normal ?  ?  ?Standing balance support: Bilateral upper extremity supported, During functional activity ?Standing balance-Leahy Scale: Good ?  ?  ?  ?  ?  ?  ?  ?  ?  ?  ?  ?  ?   ? ? ? ?Pertinent Vitals/Pain Pain Assessment ?Pain Assessment: No/denies pain  ? ? ?Home Living Family/patient expects to be discharged to:: Private residence ?Living Arrangements: Spouse/significant other;Children ?Available Help at Discharge: Family;Available 24 hours/day ?Type of Home: House ?Home Access: Ramped entrance ?  ?  ?  ?Home Layout: One level ?Home Equipment: Rollator (4 wheels);Cane - single point ?   ?  ?Prior Function Prior Level of Function : Needs assist ?  ?  ?  ?Physical Assist : ADLs (physical) ?  ?ADLs (physical): Bathing;Dressing ?Mobility Comments: Mod Ind amb community distances with a SPC, 2 falls in the last 6 months secondary to LOB ?ADLs Comments: Min A with bathing and dressing, mostly sponge bathes ?  ? ? ?Hand Dominance  ?   ? ?  ?Extremity/Trunk Assessment  ? Upper Extremity Assessment ?Upper Extremity Assessment: Generalized weakness ?  ? ?Lower Extremity Assessment ?Lower Extremity Assessment: Generalized weakness ?  ? ?   ?Communication  ? Communication: No difficulties  ?Cognition Arousal/Alertness: Awake/alert ?Behavior During Therapy: [facility] for tasks assessed/performed ?Overall Cognitive Status: Within Functional Limits for tasks assessed ?  ?  ?  ?  ?  ?  ?  ?  ?  ?  ?  ?  ?  ?  ?  ?  ?  ?  ?  ? ?  ?General Comments   ? ?  ?Exercises    ? ?Assessment/Plan  ?  ?PT Assessment Patient needs continued PT services  ?PT Problem List Decreased strength;Decreased activity tolerance;Decreased balance;Decreased mobility;Decreased knowledge of use of DME;Decreased knowledge of precautions ? ?    ?  ?PT Treatment Interventions DME instruction;Gait training;Functional mobility training;Therapeutic activities;Therapeutic exercise;Balance training;Patient/family education   ? ?PT Goals (Current goals can be found in the Care Plan section)  ?Acute Rehab PT Goals ?Patient Stated Goal: To get back home ?PT Goal Formulation: With patient ?Time For Goal Achievement: 10/05/21 ?Potential to Achieve Goals: Good ? ?  ?Frequency Min 2X/week ?  ? ? ?Co-evaluation   ?  ?  ?  ?  ? ? ?  ?AM-PAC PT "6 Clicks" Mobility  ?Outcome Measure Help needed turning from your back to your side while in a flat bed without using bedrails?: None ?Help needed moving from lying on your back to sitting on the side of a flat bed without using bedrails?: None ?Help needed moving to and from a bed to a chair (including a wheelchair)?: A Little ?Help needed standing up from a chair using your arms (e.g., wheelchair or bedside chair)?: A Little ?Help needed to walk in hospital room?: A Little ?Help needed climbing 3-5 steps with a railing? : A Lot ?6 Click Score: 19 ? ?  ?End of Session Equipment Utilized During Treatment: Gait belt ?Activity Tolerance: Patient tolerated treatment well ?Patient left: in chair;with call bell/phone within reach;with chair alarm set;with family/visitor present ?Nurse Communication: Mobility status;Weight  bearing status ?PT Visit Diagnosis: History of falling (Z91.81);Difficulty in walking, not elsewhere classified (R26.2);Muscle weakness (generalized) (M62.81) ?  ? ?Time: 8786-7672 ?PT Time Calculation (min) (ACUTE ONLY): 33 min ? ? ?Charges:   PT Evaluation ?$PT Eval Moderate Complexity: 1 Mod ?PT Treatments ?$Therapeutic Activity: 8-22 mins ?  ?   ? ?D. Royetta Asal PT, DPT ?09/22/21, 5:05 PM ? ? ?

## 2021-09-22 NOTE — Progress Notes (Addendum)
Initial Nutrition Assessment ? ?DOCUMENTATION CODES:  ? ?Obesity unspecified ? ?INTERVENTION:  ? ?-MVI with minerals daily ?-500 mg vitamin C BID ?-220 mg zinc sulfate daily x 14 days ?-Educated pt on DM self-management to optimize glycemic control and wound healing, including importance of taking medications (including insulin and rotating sites), monitoring CBGS, diet, and daily foot checks. Provided "Carbohydrate Counting for People with Diabetes" handout from [facility] Nutrition Care Manual  ? ?NUTRITION DIAGNOSIS:  ? ?Increased nutrient needs related to wound healing as evidenced by estimated needs. ? ?GOAL:  ? ?Patient will meet greater than or equal to 90% of their needs ? ?MONITOR:  ? ?PO intake, Supplement acceptance, Diet advancement, Labs, Weight trends, Skin, I & O's ? ?REASON FOR ASSESSMENT:  ? ?Consult ?Assessment of nutrition requirement/status, Diet education ? ?ASSESSMENT:  ? ?[name] is a 51 y.o. female with medical history significant of PVD, hypertension, hyperlipidemia, diabetes mellitus, COPD, asthma, stroke, GERD, depression with anxiety, tobacco abuse, seizure, anemia, CAD, CKD-3A, dCHF, right breast cancer (s/p of mastectomy), who presents with left foot ulcer with infection and bilateral lower leg pain. ? ?Pt admitted with DM infection on lt foot and lower extremity cellulitis.  ? ?3/14- s/p Procedure(s): ?INCISION AND DRAINAGE (Bilateral) ?GRAFT APPLICATION ?0/17- s/p aortogram ? ?Reviewed I/O's: +1.6 L x 24 hours and -84 ml since admission ? ?Case discussed with RN, who reports pt is doing well and potentially discharge home tomorrow.  ? ?Spoke with pt and husband at bedside. Per pt, she has a very good appetite and has been consuming most of her meals (meal completions documented at 100%). PTA, pt consumes 3 meals per day, which consists of a meat, starch, and vegetable. Per pt husband, fruits and vegetables are fresh and frozen and meats (salmon, chicken, steak) are prepared in an air  fryer. Pt drinks mostly water, diet tea, and diet soda.  ? ?Per pt, she has had other foot wounds in the past, but feels most comfortable with care at [facility]. She noticed heel wound about a month ago, which she suspected she developed due to poorly fitting shoes ([employer]) but did not realize that she had the wound secondary to neuropathy. Pt shares that she takes the Emergen-C gummy vitamins TID (provides 2250 mg of vitamin C daily and 4.5 mg of zinc daily).  ? ?Per pt, she was initially hesitant to start insulin secondary to developing an abdominal abscess, which she reports was related to insulin injections. Per husband, he was also concerned about strength and inconsistent blood sugars when taking U-500. Discussed that insulin is a tool to help glycemic control and is often needed secondary to optimal management of DM, even if other lifestyle modifications are in place. Stressed importance of good glycemic control for wound healing. Pt and husband report that they are comfortable with insulin injections, as well as her son, who is her caretaker; emphasized importance of rotating sites to decrease scar tissue build-up which will impede insulin absorption. Encouraged pt to allow her or husband to administer insulin in hospital with RN supervision if this will make them more comfortable. RD also recommended daily foot checks secondary to neuropathy. Pt and husband report that they will do whatever is recommended to help with wound healing and optimizing glycemic control. Pt also shares that she plans on getting a pair of shoes specially designed for patients with DM; stressed importance of daily foot care and good fitting shoes to prevent further skin breakdown. Reviewed vitamins  and supplements to optimize wound healing.  ? ?RD provided "Carbohydrate Counting for People with Diabetes" handout from the Academy of Nutrition and Dietetics. Discussed different food groups and their effects on  blood sugar, emphasizing carbohydrate-containing foods. Provided list of carbohydrates and recommended serving sizes of common foods. ? ?Discussed importance of controlled and consistent carbohydrate intake throughout the day. Provided examples of ways to balance meals/snacks and encouraged intake of high-fiber, whole grain complex carbohydrates. Teach back method used. ? ?Expect fair to good compliance. ? ?Lab Results  ?Component Value Date  ? HGBA1C 10.1 (H) 09/21/2021  ? PTA DM medications are 25-1000 mg empagliflozin-metformin BID.  ? ?Labs reviewed: Mg: 2.5., CBGS: 206-246 (inpatient orders for glycemic control are 0-5 units insulin aspart daily at bedtime, 0-9 units insulin aspart TID with meals, and 20 units insulin glargine-yfgn daily).   ? ?NUTRITION - FOCUSED PHYSICAL EXAM: ? ?Flowsheet Row Most Recent Value  ?Orbital Region No depletion  ?Upper Arm Region No depletion  ?Thoracic and Lumbar Region No depletion  ?Buccal Region No depletion  ?Temple Region No depletion  ?Clavicle Bone Region No depletion  ?Clavicle and Acromion Bone Region No depletion  ?Scapular Bone Region No depletion  ?Dorsal Hand No depletion  ?Patellar Region No depletion  ?Anterior Thigh Region No depletion  ?Posterior Calf Region No depletion  ?Edema (RD Assessment) Mild  ?Hair Reviewed  ?Eyes Reviewed  ?Mouth Reviewed  ?Skin Reviewed  ?Nails Reviewed  ? ?  ? ? ?Diet Order:   ?Diet Order   ? ?       ?  Diet Carb Modified Fluid consistency: Thin; Room service appropriate? Yes  Diet effective now       ?  ? ?  ?  ? ?  ? ? ?EDUCATION NEEDS:  ? ?Education needs have been addressed ? ?Skin:  Skin Assessment: Skin Integrity Issues: ?Skin Integrity Issues:: Diabetic Ulcer, Incisions ?Diabetic Ulcer: lt heel ?Incisions: closed lt foot, lt leg ? ?Last BM:  09/20/21 ? ?Height:  ? ?Ht Readings from Last 1 Encounters:  ?09/22/21 '5\' 4"'$  (1.626 m)  ? ? ?Weight:  ? ?Wt Readings from Last 1 Encounters:  ?09/22/21 104.3 kg  ? ? ?Ideal Body Weight:   54.5 kg ? ?BMI:  Body mass index is 39.48 kg/m?. ? ?Estimated Nutritional Needs:  ? ?Kcal:  1700-1900 ? ?Protein:  105-120 grams ? ?Fluid:  > 1.7 L ? ? ? ?Loistine Chance, RD, LDN, CDCES ?Registered Dietitian II ?Certified Diabetes Care and Education Specialist ?Please refer to [facility] for RD and/or RD on-call/weekend/after hours pager  ?

## 2021-09-22 NOTE — Op Note (Signed)
[facility] ? Percutaneous Study/Intervention Procedural Note ? ? ?Date of Surgery: 09/22/2021,9:36 AM ? ?Surgeon:Rainah Kirshner, Dolores Lory  ? ?Pre-operative Diagnosis: Atherosclerotic occlusive disease bilateral lower extremities with gangrenous changes to the left heel ? ?Post-operative diagnosis:  Same ? ?Procedure(s) Performed: ? 1.  Abdominal aortogram ? 2.  Left lower extremity distal runoff third order catheter placement ? 3.  StarClose right common femoral ? 4.  Ultrasound-guided access to right common femoral artery ?  ?Anesthesia: Conscious sedation was administered by the interventional radiology RN under my direct supervision. IV Versed plus fentanyl were utilized. Continuous ECG, pulse oximetry and blood pressure was monitored throughout the entire procedure.  Conscious sedation was administered for a total of 34 minutes. ? ?Sheath: 5 French 11 cm Pinnacle sheath right common femoral artery retrograde ? ?Contrast: 55 cc  ? ?Fluoroscopy Time: 2.4 minutes ? ?Indications:  The patient presents to [facility] with gangrenous changes to the left foot and heel.  Pedal pulses are nonpalpable bilaterally suggesting atherosclerotic occlusive disease.  The risks and benefits as well as alternative therapies for lower extremity revascularization are reviewed with the patient all questions are answered the patient agrees to proceed.  The patient is therefore undergoing angiography with the hope for intervention for limb salvage. ? ? ?Procedure:  [name] a 51 y.o. female who was identified and appropriate procedural time out was performed.  The patient was then placed supine on the table and prepped and draped in the usual sterile fashion.  Ultrasound was used to evaluate the right common femoral artery.  It was echolucent and pulsatile indicating it is patent .  An ultrasound image was acquired for the permanent record.  A micropuncture needle was used to access the right common  femoral artery under direct ultrasound guidance.  The microwire was then advanced under fluoroscopic guidance without difficulty followed by the micro-sheath.  A 0.035 J wire was advanced without resistance and a 5Fr sheath was placed.   ? ?Pigtail catheter was then advanced to the level of T12 and AP projection of the aorta was obtained. Pigtail catheter was then repositioned to above the bifurcation and LAO and RAO view of the pelvis was obtained. Stiff angled Glidewire and pigtail catheter was then used across the bifurcation and the catheter was positioned in the distal external iliac artery.  LAO of the left groin was then obtained. Wire was reintroduced and negotiated into the SFA and the catheter was advanced into the SFA. Distal runoff was then performed. ? ?After review of the images the catheter was removed over wire and an RAO view of the groin was obtained. StarClose device was deployed without difficulty. ? ? ?Findings:  ? Aortogram: Abdominal aorta is opacified with bolus injection contrast.  There are no significant atherosclerotic changes noted.  The aorta bilateral common internal and external iliac arteries are all widely patent ? Right Lower Extremity: The right common femoral origins of the profunda femoris and superficial femoral as well as the visualized portions of the superficial femoral and profunda femoris are widely patent there is no significant atherosclerotic changes noted. ? Left Lower Extremity: The left common femoral profundofemoral superficial femoral and popliteal arteries are widely patent.  There are no significant atherosclerotic changes noted.  There is no calcifications appreciated.  There is a smooth tapering in the mid popliteal that measures 30% and is not hemodynamically significant.  The trifurcation is widely patent the origin of the anterior tibial demonstrates a short segment 20  to 25% narrowing but this does not appear to be hemodynamically significant.   Tibioperoneal trunk peroneal and posterior tibial are all widely patent.  At the level of the foot the posterior tibial is the dominant contributor filling the plantar arteries with hyperemic response in the heel area.  This is the direct flow to the heel ulcer.  The anterior tibial does fill the dorsalis pedis although this is not quite as brisk as the posterior tibial it is widely patent.  The peroneal does coursed down to the ankle but does not appear to give large collaterals to either the dorsalis pedis or plantar artery distribution. ? ?Summary: ?Patient's arterial flow is widely patent.  There were just a few nonflow limiting lesions noted with the dominant tibial runoff via the posterior tibial which is the optimal flow pattern for healing the heel ulcer. ? ?Disposition: Patient was taken to the recovery room in stable condition having tolerated the procedure well. ? ?[name] ?09/22/2021,9:36 AM  ?

## 2021-09-22 NOTE — Consult Note (Signed)
Pharmacy Antibiotic Note ? ?[name] is a 51 y.o. female admitted on 09/20/2021 with  bilateral ulcers on lower extremities .  Pharmacy has been consulted for Vancomycin and Cefepime dosing. Of note, spoke with patient regarding allergy to PCN and she stated that she's tolerated cefepime and other cephalosporins in the past so is okay with being prescribed these. Also receiving Metronidazole Q12 hours. ? ?Plan: ?Patient with slight bump in SCr will decrease Vancomycin to '1000mg'$  IV q24h ?Goal AUC 400-550. ?Expected AUC: 422.2 ?Expected Css: 10.2 ?SCr used: 1.15, Vd used: 0.5 ? ?Cefepime 2g IV Q8 hours ? ? ?Height: '5\' 4"'$  (162.6 cm) ?Weight: 104.3 kg (230 lb) ?IBW/kg (Calculated) : 54.7 ? ?Temp (24hrs), Avg:98 ?F (36.7 ?C), Min:97.8 ?F (36.6 ?C), Max:98.3 ?F (36.8 ?C) ? ?Recent Labs  ?Lab 09/16/21 ?1610 09/20/21 ?1426 09/21/21 ?0310 09/22/21 ?0314  ?WBC 8.8 8.5 7.8 7.5  ?CREATININE  --  1.34* 1.07* 1.15*  ?LATICACIDVEN  --  1.2  --   --   ? ?  ?Estimated Creatinine Clearance: 67.3 mL/min (A) (by C-G formula based on SCr of 1.15 mg/dL (H)).   ? ?Allergies  ?Allergen Reactions  ? Adhesive [Tape] Rash and Other (See Comments)  ?  TAKES OFF THE SKIN (CERTAIN MEDICAL TAPES DO THIS!!)  ? Metoprolol Shortness Of Breath  ?  Occurrence of shortness of breath after 3 days  ? Montelukast Shortness Of Breath  ? Morphine Sulfate Anaphylaxis, Shortness Of Breath and Nausea And Vomiting  ?  Swollen Throat - Able to tolerate dilaudid  ? Penicillins Anaphylaxis, Hives and Shortness Of Breath  ?  Throat swells ?Has patient had a PCN reaction causing immediate rash, facial/tongue/throat swelling, SOB or lightheadedness with hypotension: Yes ?Has patient had a PCN reaction causing severe rash involving mucus membranes or skin necrosis: No ?Has patient had a PCN reaction that required hospitalization: Yes ?Has patient had a PCN reaction occurring within the last 10 years: No ?If all of the above answers are "NO", then may proceed with  Cephalosporin use. ?  ? Silicone Rash  ?  Takes off the skin (certain medical tapes do this)  ? Diltiazem Swelling  ? Doxycycline Hives  ? Gabapentin Swelling  ? Midodrine   ?  Lightheaded and falling down  ? ? ?Antimicrobials this admission: ?Vancomycin 3/13 >>  ?CFP 3/14 >>  ?MTZ 3/14 >> ? ?Dose adjustments this admission: ?3/15 Vancomycin decreased from '1250mg'$  IV q24h to '1000mg'$  IV q24h ? ?Microbiology results: ?3/13 BCx: NGTD ?3/14 MRSA PCR: negative ? ?Thank you for allowing pharmacy to be a part of this patient?s care. ? ?Paulina Fusi, PharmD, BCPS ?09/22/2021 ?10:28 AM ? ? ?

## 2021-09-22 NOTE — Interval H&P Note (Signed)
History and Physical Interval Note: ? ?09/22/2021 ?8:36 AM ? ?[name]  has presented today for surgery, with the diagnosis of atherosclerosis with gangrene.  The various methods of treatment have been discussed with the patient and family. After consideration of risks, benefits and other options for treatment, the patient has consented to  Procedure(s): ?Lower Extremity Angiography (Left) as a surgical intervention.  The patient's history has been reviewed, patient examined, no change in status, stable for surgery.  I have reviewed the patient's chart and labs.  Questions were answered to the patient's satisfaction.   ? ? ?[name] ? ? ?

## 2021-09-22 NOTE — Progress Notes (Signed)
?Progress Note ? ? ? ?[name]   ?ZOX:[id number]  ?DOB: Sep 04, 1970  ?DOA: 09/20/2021     2 ?PCP: Nancy Koch, NP ? ?Initial CC: left foot pain and wound ? ?Hospital Course: ?Ms. [name] is a 51 yo female with PMH PVD, HTN, HLD, DMII, COPD, asthma, CVA, GERD, depression/anxiety, tobacco abuse, seizure d/o, anemia, CAD, CKD3a, dCHF, right breast cancer (s/p mastectomy) who presented with a left foot ulcer/infection and B/L lower leg pain.  ?She has been followed outpatient by podiatry and was recently on doxycycline but states she feels that she developed a rash on her lower abdomen and inner thighs. ?Due to some drainage from her left heel ulcer and worsening pain, she presented for further evaluation. ?She underwent work-up on admission.  Left lower extremity duplex negative for DVT.  Left foot x-ray showed soft tissue ulceration on the posterior heel.  MRI left foot and heel showed heel ulceration but no drainable abscess or evidence of osteomyelitis. ?She underwent surgical debridement with podiatry on 09/21/2021.  She then underwent abdominal aortogram and left lower extremity angiogram with vascular surgery on 09/22/2021. ? ?Interval History:  ?No events overnight.  Seen in her room after returning from angiogram this morning.  Reviewed her A1c and insulin recommendation changes as noted below.  Husband also present. ? ?Assessment and Plan: ?* Diabetic infection of left foot (HCC) ?- ulceration noted on xray and MRI; no evidence of OM or abscess ?- s/p vanc on admission ?- continue on vanc/rocephin/flagyl (pharmacy has reviewed allergy profile with patient as well; has tolerated cephalosporin in the past) ?- s/p I&D on 3/14 with podiatry and angiogram with vascular surgery on 3/15 ?- continue following cultures; likely can narrow abx tomorrow to complete oral course ? ?Cellulitis of lower extremity ?- Continue antibiotics as noted under diabetic foot infection ? ?PAD (peripheral artery disease) ([place]) ?-  recent ABIs outpatient; VVS consulted on admission as well ?- underwent angiogram of abdomen and left lower extremity on 09/22/2021 with no significant stenosis nor stenting required ?-Continue aspirin and Crestor ? ?Diabetes mellitus without complication ([place]) ?- Continue current insulin regimen for now and will modify as necessary ?-Increase Semglee ?- A1c 10.1% ?- Will not resume U-500 at discharge and instead will likely send with Lantus or equivalent.  I have discussed this with patient and husband at bedside regarding her need for insulin change and also making sure she is administering appropriately.  Previously had been injecting around abdominal scar ? ?Breast cancer ([place]) ?- Continue anastrozole ?- s/p right mastectomy  ? ?CAD (coronary artery disease) ?- Continue aspirin, Crestor ? ?Depression with anxiety ?- On Pristiq at home.  Currently on Effexor as substitution ? ?Chronic kidney disease, stage 3a ([place]) ?- patient has history of CKD3a. Baseline creat ~ 1 - 1.1, eGFR 50 ? ? ?HTN (hypertension) ?- Spironolactone appears to be only antihypertensive noted on med rec ?- Blood pressure currently stable without need for medication ?- We will treat as needed ? ?Hyperlipidemia ?- Continue Crestor ? ?Chronic diastolic CHF (congestive heart failure) (HCC) ?- no s/s exacerbation  ? ?COPD with chronic bronchitis ([place]) ?- no s/s exacerbation  ? ?Tobacco abuse ?- multiple recommendations have been made for smoking cessation  ?-Continue nicotine patch ? ? ? ?Old records reviewed in assessment of this patient ? ?Antimicrobials: ?Vancomycin 09/20/2021 >> current ?Cefepime 09/21/2021 >> current ?Flagyl 09/21/2021 >> current ? ?DVT prophylaxis:  ?SCDs Start: 09/21/21 1533 ?heparin injection 5,000 Units Start: 09/20/21 1615 ? ? ?Code  Status:   Code Status: DNR ? ?Disposition Plan: Home in 2 to 3 days ?Status is: Inpatient ? ?Objective: ?Blood pressure 130/67, pulse 70, temperature 97.8 ?F (36.6 ?C), resp. rate 15, height 5'  4" (1.626 m), weight 104.3 kg, SpO2 99 %.  ?Examination:  ?Physical Exam ?Constitutional:   ?   General: She is not in acute distress. ?   Appearance: Normal appearance.  ?HENT:  ?   Head: Normocephalic and atraumatic.  ?   Mouth/Throat:  ?   Mouth: Mucous membranes are moist.  ?Eyes:  ?   Extraocular Movements: Extraocular movements intact.  ?Cardiovascular:  ?   Rate and Rhythm: Normal rate and regular rhythm.  ?Pulmonary:  ?   Effort: Pulmonary effort is normal.  ?   Breath sounds: Normal breath sounds.  ?Abdominal:  ?   General: Bowel sounds are normal. There is no distension.  ?   Palpations: Abdomen is soft.  ?   Tenderness: There is no abdominal tenderness.  ?Musculoskeletal:     ?   General: Swelling and tenderness present.  ?   Cervical back: Normal range of motion and neck supple.  ?Skin: ?   Comments: Mild scattered rash noted across lower abdomen and inner thighs.  ?Right and left lower extremity wrapped in surgical bandage.  Chronic stasis changes appreciated on bilateral lower extremities  ?Neurological:  ?   Mental Status: She is alert and oriented to person, place, and time.  ?Psychiatric:     ?   Mood and Affect: Mood normal.     ?   Behavior: Behavior normal.  ?  ? ?Consultants:  ?Podiatry ?Vascular surgery ? ?Procedures:  ?LLE I&D, 09/21/21 ?Abdominal aortogram and left lower extremity angiography, 09/22/2021 ? ?Data Reviewed: ?Results for orders placed or performed during the hospital encounter of 09/20/21 (from the past 24 hour(s))  ?Glucose, capillary     Status: Abnormal  ? Collection Time: 09/21/21  5:41 PM  ?Result Value Ref Range  ? Glucose-Capillary 204 (H) 70 - 99 mg/dL  ?Glucose, capillary     Status: Abnormal  ? Collection Time: 09/21/21  8:42 PM  ?Result Value Ref Range  ? Glucose-Capillary 246 (H) 70 - 99 mg/dL  ?Basic metabolic panel     Status: Abnormal  ? Collection Time: 09/22/21  3:14 AM  ?Result Value Ref Range  ? Sodium 137 135 - 145 mmol/L  ? Potassium 4.2 3.5 - 5.1 mmol/L  ?  Chloride 104 98 - 111 mmol/L  ? CO2 28 22 - 32 mmol/L  ? Glucose, Bld 195 (H) 70 - 99 mg/dL  ? BUN 33 (H) 6 - 20 mg/dL  ? Creatinine, Ser 1.15 (H) 0.44 - 1.00 mg/dL  ? Calcium 8.8 (L) 8.9 - 10.3 mg/dL  ? GFR, Estimated 57 (L) >60 mL/min  ? Anion gap 5 5 - 15  ?CBC with Differential/Platelet     Status: Abnormal  ? Collection Time: 09/22/21  3:14 AM  ?Result Value Ref Range  ? WBC 7.5 4.0 - 10.5 K/uL  ? RBC 3.89 3.87 - 5.11 MIL/uL  ? Hemoglobin 10.4 (L) 12.0 - 15.0 g/dL  ? HCT 33.9 (L) 36.0 - 46.0 %  ? MCV 87.1 80.0 - 100.0 fL  ? MCH 26.7 26.0 - 34.0 pg  ? MCHC 30.7 30.0 - 36.0 g/dL  ? RDW 15.1 11.5 - 15.5 %  ? Platelets 180 150 - 400 K/uL  ? nRBC 0.0 0.0 - 0.2 %  ? Neutrophils Relative %  73 %  ? Neutro Abs 5.5 1.7 - 7.7 K/uL  ? Lymphocytes Relative 15 %  ? Lymphs Abs 1.2 0.7 - 4.0 K/uL  ? Monocytes Relative 8 %  ? Monocytes Absolute 0.6 0.1 - 1.0 K/uL  ? Eosinophils Relative 3 %  ? Eosinophils Absolute 0.2 0.0 - 0.5 K/uL  ? Basophils Relative 1 %  ? Basophils Absolute 0.1 0.0 - 0.1 K/uL  ? Immature Granulocytes 0 %  ? Abs Immature Granulocytes 0.03 0.00 - 0.07 K/uL  ?Magnesium     Status: Abnormal  ? Collection Time: 09/22/21  3:14 AM  ?Result Value Ref Range  ? Magnesium 2.5 (H) 1.7 - 2.4 mg/dL  ?Glucose, capillary     Status: Abnormal  ? Collection Time: 09/22/21  8:22 AM  ?Result Value Ref Range  ? Glucose-Capillary 207 (H) 70 - 99 mg/dL  ?Glucose, capillary     Status: Abnormal  ? Collection Time: 09/22/21  9:40 AM  ?Result Value Ref Range  ? Glucose-Capillary 206 (H) 70 - 99 mg/dL  ?Glucose, capillary     Status: Abnormal  ? Collection Time: 09/22/21 12:51 PM  ?Result Value Ref Range  ? Glucose-Capillary 235 (H) 70 - 99 mg/dL  ? ?*Note: Due to a large number of results and/or encounters for the requested time period, some results have not been displayed. A complete set of results can be found in Results Review.  ?  ?I have Reviewed nursing notes, Vitals, and Lab results since pt's last encounter. Pertinent lab  results : see above ?I have ordered test including BMP, CBC, Mg ?I have reviewed the last note from staff over past 24 hours ?I have discussed pt's care plan and test results with nursing staff, case [facility]

## 2021-09-23 ENCOUNTER — Encounter: Payer: Self-pay | Admitting: Podiatry

## 2021-09-23 ENCOUNTER — Ambulatory Visit: Payer: Medicare Other | Admitting: Physician Assistant

## 2021-09-23 LAB — BASIC METABOLIC PANEL
Anion gap: 6 (ref 5–15)
BUN: 35 mg/dL — ABNORMAL HIGH (ref 6–20)
CO2: 25 mmol/L (ref 22–32)
Calcium: 8.7 mg/dL — ABNORMAL LOW (ref 8.9–10.3)
Chloride: 104 mmol/L (ref 98–111)
Creatinine, Ser: 1.04 mg/dL — ABNORMAL HIGH (ref 0.44–1.00)
GFR, Estimated: >60 mL/min (ref >60)
Glucose, Bld: 239 mg/dL — ABNORMAL HIGH (ref 70–99)
Potassium: 4.5 mmol/L (ref 3.5–5.1)
Sodium: 135 mmol/L (ref 135–145)

## 2021-09-23 LAB — CBC WITH DIFFERENTIAL/PLATELET
Abs Immature Granulocytes: 0.06 10*3/uL (ref 0.00–0.07)
Basophils Absolute: 0.1 10*3/uL (ref 0.0–0.1)
Basophils Relative: 1 %
Eosinophils Absolute: 0.3 10*3/uL (ref 0.0–0.5)
Eosinophils Relative: 4 %
HCT: 34.7 % — ABNORMAL LOW (ref 36.0–46.0)
Hemoglobin: 10.5 g/dL — ABNORMAL LOW (ref 12.0–15.0)
Immature Granulocytes: 1 %
Lymphocytes Relative: 12 %
Lymphs Abs: 1 10*3/uL (ref 0.7–4.0)
MCH: 26.4 pg (ref 26.0–34.0)
MCHC: 30.3 g/dL (ref 30.0–36.0)
MCV: 87.2 fL (ref 80.0–100.0)
Monocytes Absolute: 0.6 10*3/uL (ref 0.1–1.0)
Monocytes Relative: 8 %
Neutro Abs: 6.3 10*3/uL (ref 1.7–7.7)
Neutrophils Relative %: 74 %
Platelets: 173 10*3/uL (ref 150–400)
RBC: 3.98 MIL/uL (ref 3.87–5.11)
RDW: 15.1 % (ref 11.5–15.5)
WBC: 8.3 10*3/uL (ref 4.0–10.5)
nRBC: 0 % (ref 0.0–0.2)

## 2021-09-23 LAB — GLUCOSE, CAPILLARY
Glucose-Capillary: 216 mg/dL — ABNORMAL HIGH (ref 70–99)
Glucose-Capillary: 246 mg/dL — ABNORMAL HIGH (ref 70–99)

## 2021-09-23 LAB — MAGNESIUM: Magnesium: 2.5 mg/dL — ABNORMAL HIGH (ref 1.7–2.4)

## 2021-09-23 MED ORDER — INSULIN PEN NEEDLE 32G X 4 MM MISC
11 refills | Status: DC
Start: 2021-09-23 — End: 2021-09-23

## 2021-09-23 MED ORDER — INSULIN GLARGINE 100 UNIT/ML SOLOSTAR PEN: 25.0000 [IU] | PEN_INJECTOR | Freq: Every day | SUBCUTANEOUS | 11 refills | Status: DC

## 2021-09-23 MED ORDER — CLINDAMYCIN HCL 150 MG PO CAPS
300.0000 mg | ORAL_CAPSULE | Freq: Four times a day (QID) | ORAL | Status: DC
  Administered 2021-09-23: 300 mg via ORAL
  Filled 2021-09-23 (×3): qty 2

## 2021-09-23 MED ORDER — ACETAMINOPHEN 325 MG PO TABS: 650.0000 mg | ORAL_TABLET | ORAL | Status: AC | PRN

## 2021-09-23 MED ORDER — INSULIN GLARGINE-YFGN 100 UNIT/ML ~~LOC~~ SOLN
25.0000 [IU] | Freq: Every day | SUBCUTANEOUS | Status: DC
  Administered 2021-09-23: 25 [IU] via SUBCUTANEOUS
  Filled 2021-09-23: qty 0.25

## 2021-09-23 MED ORDER — OXYCODONE HCL 5 MG PO TABS: 5.0000 mg | ORAL_TABLET | ORAL | 0 refills | Status: AC | PRN

## 2021-09-23 MED ORDER — SULFAMETHOXAZOLE-TRIMETHOPRIM 800-160 MG PO TABS: 1.0000 | ORAL_TABLET | Freq: Two times a day (BID) | ORAL | Status: DC

## 2021-09-23 MED ORDER — INSULIN ASPART 100 UNIT/ML FLEXPEN: 1.0000 [IU] | PEN_INJECTOR | Freq: Three times a day (TID) | SUBCUTANEOUS | 11 refills | Status: DC

## 2021-09-23 MED ORDER — INSULIN PEN NEEDLE 32G X 4 MM MISC: 1.0000 | Freq: Three times a day (TID) | 11 refills | Status: DC

## 2021-09-23 MED ORDER — CLINDAMYCIN HCL 300 MG PO CAPS: 300.0000 mg | ORAL_CAPSULE | Freq: Four times a day (QID) | ORAL | 0 refills | Status: AC

## 2021-09-23 NOTE — Op Note (Signed)
? ?  OPERATIVE REPORT ?Patient name: [name] ?MRN: [id number] ?DOB: [birth year] ? ?DOS: 09/23/21 ? ?Preop Dx: Ulcers b/l lower extremitites ?Postop Dx: same ? ?Procedure:  ?1. Excisional debridement of ulcers w/ application of wound product b/l lower extremities ? ?Surgeon: [name] DPM ? ?Anesthesia: General ? ?Hemostasis: None ? ?EBL: 20 mL ?Materials: Acell wound micromatrix 1g. Acell Cytal wound matrix. ?Injectables: None ?Pathology: None ? ?Condition: The patient tolerated the procedure and anesthesia well. No complications noted or reported ? ? ?Justification for procedure: The patient is a 51 y.o. female PMHx DM uncontrolled with PVD who presents today for surgical correction of ulcers to the bilateral lower extremities. All conservative modalities of been unsuccessful in providing any sort of satisfactory alleviation of symptoms with the patient. The patient was told benefits as well as possible side effects of the surgery. The patient consented for surgical correction. The patient consent form was reviewed. All patient questions were answered. No guarantees were expressed or implied. The patient and the surgeon both signed the patient consent form with the witness present and placed in the patient's chart. ? ? ?Procedure in Detail: The patient was brought to the operating room, placed in the operating table in the prone position at which time an aseptic scrub and drape were performed about the patient's respective lower extremity after anesthesia was induced as described above. Attention was then directed to the surgical area where procedure number one commenced. ? ?Procedure #1: Excisional debridement ulcers bilateral lower extremities ?Attention was directed to the bilateral lower extremity ulcers after the legs were prepped and draped and medically necessary excisional debridement including muscle and deep fascial tissue was performed using #15 scalpel.  Excisional debridement of the necrotic  nonviable tissue down to healthier bleeding viable tissue was performed.  Wound #1 noted to the right anterior leg.  Wound #2 noted to the left dorsal foot.  Wound #3 noted to the left posterior heel.   ?After excisional debridement using a #15 scalpel, additional Hydro surgical debridement was performed using Versajet to all wounds.  Please see attached intraoperative photos below. ?After appropriate debridement down to mostly healthy bleeding tissue, prepared ACell micromatrix powder in combination with ACell Cytal membrane were applied to the wounds and secured with dry sterile dressings.   ? ? ? ? ? ? ? ? ?Dry sterile compressive dressings were then applied to all previously mentioned incision sites about the patient's lower extremity. The patient was then transferred from the operating room to the recovery room having tolerated the procedure and anesthesia well. All vital signs are stable. After a brief stay in the recovery room the patient was readmitted to inpatient room with adequate prescriptions for analgesia. Verbal as well as written instructions were provided for the patient regarding wound care. The patient is to keep the dressings clean dry and intact until they are to follow surgeon Dr. Daylene Katayama in the office upon discharge.  ? ?[name], DPM ?[place] ? ?Dr. [name], DPM  ?  ?[address]                                       ?[place], [place] [address]                ?Office [phone]  ?Fax [phone] ? ? ?

## 2021-09-23 NOTE — TOC Initial Note (Addendum)
Transition of Care [facility]) - Initial/Assessment Note    Patient Details  Name: Laramie Vanwormer MRN: [id number] Date of Birth: 04/17/1971  Transition of Care [facility]) CM/SW Contact:    Maree Krabbe, LCSW Phone Number: 09/23/2021, 10:29 AM  Clinical Narrative:    CSW spoke with pt at bedside. Pt is agreeable to [facility] with no agency preference. Pt is also agreeable to the recommended DME (walker and 3n1). Pt states at dc her spouse will pick her up.  Frances Furbish will service pt. DME ordered through [employer].              Per [employer] pt got a walker 04/12/18, therfore insurance will not cover another-- CSW explained this to pt's spouse. There is also a issue with bedside commode stating the notes are proving pt is confined to one room with no restroom. Therefore insurance will also not cover a 3N1.  Expected Discharge Plan: Home w Home Health Services Barriers to Discharge: Continued Medical Work up   Patient Goals and CMS Choice Patient states their goals for this hospitalization and ongoing recovery are:: to return home   Choice offered to / list presented to : Patient  Expected Discharge Plan and Services Expected Discharge Plan: Home w Home Health Services In-house Referral: Clinical Social Work   Post Acute Care Choice: Home Health Living arrangements for the past 2 months: Single Family Home                 DME Arranged: Dan Humphreys, 3-N-1 DME Agency: [employer] Date DME Agency Contacted: 09/23/21 Time DME Agency Contacted: 74 Representative spoke with at DME Agency: danielle HH Arranged: PT, RN HH Agency: [facility] Date [facility] Agency Contacted: 09/23/21 Time HH Agency Contacted: 1028 Representative spoke with at [facility] Agency: cory  Prior Living Arrangements/Services Living arrangements for the past 2 months: Single Family Home Lives with:: Spouse Patient language and need for interpreter reviewed:: Yes Do you feel safe going back to the place where you live?: Yes      Need for  Family Participation in Patient Care: Yes (Comment) Care giver support system in place?: Yes (comment)   Criminal Activity/Legal Involvement Pertinent to Current Situation/Hospitalization: No - Comment as needed  Activities of Daily Living   ADL Screening (condition at time of admission) Patient's cognitive ability adequate to safely complete daily activities?: Yes Is the patient deaf or have difficulty hearing?: No Does the patient have difficulty seeing, even when wearing glasses/contacts?: No Does the patient have difficulty concentrating, remembering, or making decisions?: No Patient able to express need for assistance with ADLs?: Yes Does the patient have difficulty dressing or bathing?: No Independently performs ADLs?: Yes (appropriate for developmental age) Does the patient have difficulty walking or climbing stairs?: Yes Weakness of Legs: None Weakness of Arms/Hands: None  Permission Sought/Granted Permission sought to share information with : Family Supports Permission granted to share information with : Yes, Verbal Permission Granted  Share Information with NAME: [name]     Permission granted to share info w Relationship: spouse     Emotional Assessment Appearance:: Appears stated age Attitude/Demeanor/Rapport: Engaged Affect (typically observed): Accepting Orientation: : Oriented to Situation, Oriented to  Time, Oriented to Place, Oriented to Self Alcohol / Substance Use: Not Applicable Psych Involvement: No (comment)  Admission diagnosis:  Cellulitis of left lower extremity [L03.116] Left foot infection [L08.9] Cellulitis of lower extremity [L03.119] Patient Active Problem List   Diagnosis Date Noted   Left foot infection 09/20/2021   HTN (  hypertension) 09/20/2021   Diabetes mellitus without complication (HCC) 09/20/2021   Chronic kidney disease, stage 3a (HCC) 09/20/2021   History of CVA (cerebrovascular accident) 09/20/2021   Depression with anxiety  09/20/2021   CAD (coronary artery disease) 09/20/2021   Breast cancer (HCC) 09/20/2021   Diabetic infection of left foot (HCC) 09/20/2021   Cellulitis of lower extremity 09/20/2021   Hematoma 07/13/2021   MRSA infection 07/09/2021   Abscess 07/09/2021   Cellulitis of finger of left hand 06/23/2021   Folliculitis 06/23/2021   Headache disorder 03/08/2021   Dysuria 03/05/2021   Pressure sore 01/29/2021   Hyperlipidemia associated with type 2 diabetes mellitus (HCC) 01/13/2021   Fatigue 12/01/2020   Acute on chronic diastolic heart failure (HCC) 11/16/2020   Chronic migraine without aura without status migrainosus, not intractable 10/01/2020   Dizziness 10/01/2020   Microscopic hematuria 07/01/2020   Pulmonary nodules 07/01/2020   Hav (hallux abducto valgus), unspecified laterality 06/11/2020   Venous ulcer of ankle, left (HCC) 05/15/2020   Contracture of hand joint 05/15/2020   Acute kidney injury superimposed on CKD (HCC)    Hyperlipidemia    Carcinoma of lower-outer quadrant of right breast in female, estrogen receptor positive (HCC) 11/12/2019   Restrictive lung disease 10/08/2019   Postmenopausal bleeding 08/30/2019   Carotid stenosis, asymptomatic, right 08/16/2019   Nausea and vomiting 07/15/2019   Cellulitis of left lower extremity 06/04/2019   Multiple wounds of skin 06/04/2019   Other injury of unspecified body region, initial encounter 06/04/2019   (HFpEF) heart failure with preserved ejection fraction (HCC) 11/16/2018   Left-sided weakness 09/13/2018   Weakness of left lower extremity 09/05/2018   Recurrent falls 09/05/2018   PAD (peripheral artery disease) (HCC) 08/27/2018   Chronic congestive heart failure (HCC) 08/02/2018   Vaginal itching 06/15/2018   Chronic upper extremity pain (Secondary Area of Pain) (Bilateral) 04/23/2018   Diabetic polyneuropathy associated with type 2 diabetes mellitus (HCC) 04/23/2018   Long term prescription benzodiazepine use  04/23/2018   Neurogenic pain 04/23/2018   Vitamin D insufficiency 01/29/2018   Insulin dependent type 2 diabetes mellitus (HCC) 01/23/2018   DNR (do not resuscitate) 01/23/2018   CKD stage 3 due to type 2 diabetes mellitus (HCC) 01/23/2018   IBS (irritable bowel syndrome) 01/19/2018   Chronic lower extremity pain (Primary Area of Pain) (Bilateral) 01/08/2018   Chronic low back pain [facility] Area of Pain) (Bilateral) w/ sciatica (Bilateral) 01/08/2018   Chronic pain syndrome 01/08/2018   Pharmacologic therapy 01/08/2018   Disorder of skeletal system 01/08/2018   Problems influencing health status 01/08/2018   Long term current use of opiate analgesic 01/08/2018   Restless leg syndrome 08/02/2017   Nocturnal hypoxemia 03/29/2017   Vision disturbance 02/28/2017   CKD (chronic kidney disease), stage II 02/28/2017   Visual loss, bilateral    Chronic respiratory failure with hypoxia (HCC)/ nocturnal 02 dep  10/28/2016   Upper airway cough syndrome 08/22/2016   Cigarette smoker 06/15/2016   Simple chronic bronchitis (HCC)    Coronary artery disease involving native heart without angina pectoris 05/16/2016   Chronic diastolic CHF (congestive heart failure) (HCC) 11/04/2015   Orthopnea 11/03/2015   Bilateral leg edema    Diabetes (HCC)    COPD exacerbation (HCC) 10/15/2015   Fracture of rib, closed 10/15/2015   Venous stasis of both lower extremities 03/29/2015   Preventative health care 02/04/2015   Hypertension associated with diabetes (HCC) 01/02/2015   Inguinal hernia 11/27/2014   Allergic rhinitis with postnasal drip  01/21/2014   Aortic valve disorders 04/18/2013   Sleep apnea 05/22/2012   Syncope and collapse 10/09/2011   Seizure disorder (HCC) 10/09/2011   Bipolar disorder (HCC) 10/09/2011   TIA (transient ischemic attack) 06/22/2011   Constipation 06/15/2011   HYPERTRIGLYCERIDEMIA 07/17/2007   Situational anxiety 05/30/2007   COPD with chronic bronchitis (HCC) 05/30/2007    Morbid (severe) obesity due to excess calories (HCC) 04/23/2007   Tobacco abuse 09/07/2006   PCP:  Doreene Nest, NP Pharmacy:   [facility] - [place], [place] - [phone] [place] [address] Phone: [phone] Fax: [phone]  [facility] - [place], [place] - [address] [place] [address] Phone: [phone] Fax: [phone]  [facility] - [place], [place] - [address] [place] [address] Phone: [phone] Fax: [phone]     Social Determinants of Health (SDOH) Interventions    Readmission Risk Interventions No flowsheet data found.

## 2021-09-23 NOTE — Evaluation (Signed)
Occupational Therapy Evaluation ?Patient Details ?Name: [name] ?MRN: [id number] ?DOB: November 07, 1970 ?Today's Date: 09/23/2021 ? ? ?History of Present Illness Pt is a 51 yo female with PMH PVD, HTN, HLD, DMII, COPD, asthma, CVA, GERD, depression/anxiety, tobacco abuse, seizure d/o, anemia, CAD, CKD3a, dCHF, right breast cancer (s/p mastectomy) who presented with a left foot ulcer/infection and BLE lower leg pain.  Pt diagnosed with atherosclerotic occlusive disease bilateral lower extremities with gangrenous changes to the left heel. Pt underwent surgical debridement with podiatry on 09/21/2021.  She then underwent abdominal aortogram and left lower extremity angiogram with vascular surgery on 09/22/2021.  ? ?Clinical Impression ?  ?Patient presenting with decreased Ind in self care, balance, functional mobility/transfers, endurance, and safety awareness. Patient reports living at home with family at baseline. Her husband does provide min A for self care tasks as needed. OT reviewed precautions with pt and assisted her with donning CAM boot. Pt was unable to manage velcro straps on her own and says husband will assist at home.Pt standing and ambulates with CAM boot to sink for grooming tasks with supervision overall in room. OT recommended 3 in 1 BSC to decrease fall risk with frequent urination at night and pt not wearing CAM boot at night and unable to don herself. Patient will benefit from acute OT to increase overall independence in the areas of ADLs, functional mobility,  in order to safely discharge home with family.  ?   ? ?Recommendations for follow up therapy are one component of a multi-disciplinary discharge planning process, led by the attending physician.  Recommendations may be updated based on patient status, additional functional criteria and insurance authorization.  ? ?Follow Up Recommendations ? Home health OT  ?  ?Assistance Recommended at Discharge Intermittent Supervision/Assistance  ?Patient can  return home with the following A little help with walking and/or transfers;A little help with bathing/dressing/bathroom;Assistance with cooking/housework;Help with stairs or ramp for entrance;Assist for transportation ? ?  ?Functional Status Assessment ? Patient has had a recent decline in their functional status and demonstrates the ability to make significant improvements in function in a reasonable and predictable amount of time.  ?Equipment Recommendations ? BSC/3in1;Other (comment) (RW)  ?  ?   ?Precautions / Restrictions Precautions ?Precautions: Fall ?Restrictions ?Weight Bearing Restrictions: Yes ?Other Position/Activity Restrictions: Per Dr. Amalia Hailey and Dr. Sabino Gasser, pt NWB on L heel but may TTWB for transitions/transfers. If CAM boot is donned than pt is WBAT  ? ?  ? ?Mobility Bed Mobility ?  ?  ?  ?  ?  ?  ?  ?General bed mobility comments: seated in recliner chair upon entering the room ?  ? ?Transfers ?Overall transfer level: Needs assistance ?Equipment used: Rolling walker (2 wheels) ?Transfers: Sit to/from Stand ?Sit to Stand: Supervision ?  ?  ?  ?  ?  ?General transfer comment: with CAM boot donned ?  ? ?  ?Balance Overall balance assessment: Needs assistance ?  ?Sitting balance-Leahy Scale: Normal ?  ?  ?Standing balance support: Bilateral upper extremity supported, During functional activity, Reliant on assistive device for balance ?Standing balance-Leahy Scale: Good ?  ?  ?  ?  ?  ?  ?  ?  ?  ?  ?  ?  ?   ? ?ADL either performed or assessed with clinical judgement  ? ?ADL Overall ADL's : Needs assistance/impaired ?  ?  ?Grooming: Wash/dry hands;Standing ?  ?  ?  ?  ?  ?  ?  ?Lower  Body Dressing: Minimal assistance;Sit to/from stand ?  ?Toilet Transfer: Supervision/safety;Rolling walker (2 wheels) ?Toilet Transfer Details (indicate cue type and reason): simulated ?  ?  ?  ?  ?Functional mobility during ADLs: Supervision/safety;Rolling walker (2 wheels) ?   ? ? ? ?Vision Patient Visual Report: No  change from baseline ?   ?   ?   ?   ? ?Pertinent Vitals/Pain Pain Assessment ?Pain Assessment: 0-10 ?Pain Score: 2  ?Pain Descriptors / Indicators: Discomfort ?Pain Intervention(s): Monitored during session, Repositioned  ? ? ? ?Hand Dominance Right ?  ?Extremity/Trunk Assessment Upper Extremity Assessment ?Upper Extremity Assessment: Generalized weakness ?  ?Lower Extremity Assessment ?Lower Extremity Assessment: Generalized weakness ?  ?  ?  ?Communication Communication ?Communication: No difficulties ?  ?Cognition Arousal/Alertness: Awake/alert ?Behavior During Therapy: [facility] for tasks assessed/performed ?Overall Cognitive Status: Within Functional Limits for tasks assessed ?  ?  ?  ?  ?  ?  ?  ?  ?  ?  ?  ?  ?  ?  ?  ?  ?  ?  ?  ?   ?   ?   ? ? ?Home Living Family/patient expects to be discharged to:: Private residence ?Living Arrangements: Spouse/significant other;Children ?Available Help at Discharge: Family;Available 24 hours/day ?Type of Home: House ?Home Access: Ramped entrance ?  ?  ?Home Layout: One level ?  ?  ?Bathroom Shower/Tub: Tub/shower unit ?  ?Bathroom Toilet: Standard ?  ?  ?Home Equipment: Rollator (4 wheels);Cane - single point ?  ?  ?  ? ?  ?Prior Functioning/Environment Prior Level of Function : Needs assist ?  ?  ?  ?  ?  ?  ?Mobility Comments: Mod Ind amb community distances with a SPC, 2 falls in the last 6 months secondary to LOB ?ADLs Comments: Min A with bathing and dressing, mostly sponge bathes ?  ? ?  ?  ?OT Problem List: Decreased strength;Pain;Decreased activity tolerance;Decreased safety awareness;Impaired balance (sitting and/or standing);Decreased knowledge of use of DME or AE;Decreased knowledge of precautions ?  ?   ?OT Treatment/Interventions: Self-care/ADL training;Therapeutic exercise;Therapeutic activities;Energy conservation;DME and/or AE instruction;Manual therapy;Balance training;Patient/family education  ?  ?OT Goals(Current goals can be found in the care plan section)  Acute Rehab OT Goals ?Patient Stated Goal: to go home ?OT Goal Formulation: With patient ?Time For Goal Achievement: 10/07/21 ?Potential to Achieve Goals: Good ?ADL Goals ?Pt Will Perform Grooming: with modified independence;standing ?Pt Will Perform Lower Body Dressing: with supervision;with adaptive equipment;sit to/from stand ?Pt Will Transfer to Toilet: with modified independence;ambulating ?Pt Will Perform Toileting - Clothing Manipulation and hygiene: with modified independence;sit to/from stand  ?OT Frequency: Min 2X/week ?  ? ?   ?AM-PAC OT "6 Clicks" Daily Activity     ?Outcome Measure Help from another person eating meals?: None ?Help from another person taking care of personal grooming?: None ?Help from another person toileting, which includes using toliet, bedpan, or urinal?: A Little ?Help from another person bathing (including washing, rinsing, drying)?: A Little ?Help from another person to put on and taking off regular upper body clothing?: None ?Help from another person to put on and taking off regular lower body clothing?: A Little ?6 Click Score: 21 ?  ?End of Session Equipment Utilized During Treatment: Rolling walker (2 wheels);Other (comment) (CAM boot) ?Nurse Communication: Mobility status ? ?Activity Tolerance: Patient tolerated treatment well ?Patient left: in chair;with chair alarm set;with call bell/phone within reach ? ?OT Visit Diagnosis: Unsteadiness on feet (R26.81);Muscle weakness (  generalized) (M62.81)  ?              ?Time: 0940-1005 ?OT Time Calculation (min): 25 min ?Charges:  OT General Charges ?$OT Visit: 1 Visit ?OT Evaluation ?$OT Eval Moderate Complexity: 1 Mod ?OT Treatments ?$Self Care/Home Management : 8-22 mins ? ?Darleen Crocker, MS, OTR/L , CBIS ?ascom [phone]  ?09/23/21, 12:45 PM  ?

## 2021-09-24 ENCOUNTER — Ambulatory Visit: Payer: BC Managed Care – PPO

## 2021-09-24 ENCOUNTER — Telehealth: Payer: Medicare Other

## 2021-09-24 DIAGNOSIS — E119 Type 2 diabetes mellitus without complications: Secondary | ICD-10-CM

## 2021-09-24 DIAGNOSIS — I5032 Chronic diastolic (congestive) heart failure: Secondary | ICD-10-CM

## 2021-09-24 DIAGNOSIS — I10 Essential (primary) hypertension: Secondary | ICD-10-CM

## 2021-09-24 NOTE — Chronic Care Management (AMB) (Signed)
?Chronic Care Management  ? ?CCM RN Visit Note ? ?09/27/2021 ?Name: [name] MRN: [id number] DOB: [birth year] ? ?Subjective: ?[name] is a 51 y.o. year old female who is a primary care patient of Pleas Koch, NP. The care management team was consulted for assistance with disease management and care coordination needs.   ? ?Engaged with patient by telephone for follow up visit in response to provider referral for case management and/or care coordination services.  ? ?Consent to Services:  ?The patient was given information about Chronic Care Management services, agreed to services, and gave verbal consent prior to initiation of services.  Please see initial visit note for detailed documentation.  ? ?Patient agreed to services and verbal consent obtained.  ? ?Assessment: Review of patient past medical history, allergies, medications, health status, including review of consultants reports, laboratory and other test data, was performed as part of comprehensive evaluation and provision of chronic care management services.  ? ?SDOH (Social Determinants of Health) assessments and interventions performed:   ? ?CCM Care Plan ? ?Allergies  ?Allergen Reactions  ? Adhesive [Tape] Rash and Other (See Comments)  ?  TAKES OFF THE SKIN (CERTAIN MEDICAL TAPES DO THIS!!)  ? Metoprolol Shortness Of Breath  ?  Occurrence of shortness of breath after 3 days  ? Montelukast Shortness Of Breath  ? Morphine Sulfate Anaphylaxis, Shortness Of Breath and Nausea And Vomiting  ?  Swollen Throat - Able to tolerate dilaudid  ? Penicillins Anaphylaxis, Hives and Shortness Of Breath  ?  Throat swells ?Has patient had a PCN reaction causing immediate rash, facial/tongue/throat swelling, SOB or lightheadedness with hypotension: Yes ?Has patient had a PCN reaction causing severe rash involving mucus membranes or skin necrosis: No ?Has patient had a PCN reaction that required hospitalization: Yes ?Has patient had a PCN reaction occurring  within the last 10 years: No ?If all of the above answers are "NO", then may proceed with Cephalosporin use. ?  ? Silicone Rash  ?  Takes off the skin (certain medical tapes do this)  ? Diltiazem Swelling  ? Doxycycline Hives  ? Gabapentin Swelling  ? Midodrine   ?  Lightheaded and falling down  ? ? ?Outpatient Encounter Medications as of 09/24/2021  ?Medication Sig Note  ? acetaminophen (TYLENOL) 325 MG tablet Take 2 tablets (650 mg total) by mouth every 4 (four) hours as needed for headache or mild pain.   ? albuterol (VENTOLIN HFA) 108 (90 Base) MCG/ACT inhaler Inhale 2 puffs into the lungs every 6 (six) hours as needed for wheezing or shortness of breath.   ? ALPRAZolam (XANAX) 1 MG tablet Take 1 mg by mouth 4 (four) times daily as needed for anxiety.    ? anastrozole (ARIMIDEX) 1 MG tablet Take 1 tablet (1 mg total) by mouth daily.   ? aspirin EC 81 MG tablet Take 81 mg by mouth daily before breakfast.   ? blood glucose meter kit and supplies Use up to four times daily as directed. (FOR ICD-10 E10.9, E11.9).   ? budesonide-formoterol (SYMBICORT) 80-4.5 MCG/ACT inhaler INHALE 2 PUFFS BY MOUTH EVERY 12 HOURS TO PREVENT COUGH OR WHEEZING *RINSE MOUTH AFTER EACH USE*   ? busPIRone (BUSPAR) 30 MG tablet Take 30 mg by mouth 2 (two) times daily.   ? citalopram (CELEXA) 20 MG tablet Take 1 tablet (20 mg total) by mouth daily. 02/15/2021: Patient states she takes 2 per day.  ? clindamycin (CLEOCIN) 300 MG capsule Take 1 capsule (300 mg  total) by mouth 4 (four) times daily for 5 days.   ? desvenlafaxine (PRISTIQ) 100 MG 24 hr tablet Take 100 mg by mouth daily.   ? Empagliflozin-metFORMIN HCl ER 25-1000 MG TB24 Take 1 tablet by mouth daily with breakfast.   ? EYLEA 2 MG/0.05ML SOSY    ? gentamicin cream (GARAMYCIN) 0.1 % Apply 1 application. topically daily.   ? glucose blood test strip Use as instructed   ? insulin aspart (NOVOLOG) 100 UNIT/ML FlexPen Inject 1-9 Units into the skin 4 (four) times daily -  before meals and  at bedtime. Glucose 121 - 150: 1 unit, Glucose 151 - 200: 2 units, Glucose 201 - 250: 3 units, Glucose 251 - 300: 5 units, Glucose 301 - 350: 7 units, Glucose 351 - 400: 9 units, Glucose > 400 call MD   ? insulin glargine (LANTUS) 100 UNIT/ML Solostar Pen Inject 25 Units into the skin daily.   ? Insulin Pen Needle 32G X 4 MM MISC Inject 1 each into the skin 4 (four) times daily -  before meals and at bedtime. Used to give insulin injections twice daily.   ? mupirocin ointment (BACTROBAN) 2 % Apply 1 application topically 2 (two) times daily.   ? ondansetron (ZOFRAN) 8 MG tablet One pill every 8 hours as needed for nausea/vomitting.   ? oxyCODONE (ROXICODONE) 5 MG immediate release tablet Take 1 tablet (5 mg total) by mouth every 4 (four) hours as needed for up to 5 days.   ? OXYGEN Inhale 2-4 L into the lungs 2 (two) times daily as needed (shortness of breath).   ? pregabalin (LYRICA) 300 MG capsule Take 1 capsule (300 mg total) by mouth 2 (two) times daily. For nerve pain.   ? ReliOn Ultra Thin Lancets 30G MISC Use as directed for blood sugar checks.   ? rOPINIRole (REQUIP) 1 MG tablet TAKE 1 TABLET BY MOUTH EVERY DAY AT BEDTIME FOR  RESTLESS  LEG   ? rosuvastatin (CRESTOR) 20 MG tablet Take 1 tablet (20 mg total) by mouth daily. For cholesterol.   ? spironolactone (ALDACTONE) 50 MG tablet Take 50 mg by mouth daily.   ? traZODone (DESYREL) 100 MG tablet Take 200 mg by mouth at bedtime.   ? ?No facility-administered encounter medications on file as of 09/24/2021.  ? ? ?Patient Active Problem List  ? Diagnosis Date Noted  ? Left foot infection 09/20/2021  ? HTN (hypertension) 09/20/2021  ? Diabetes mellitus without complication ([place]) 64/33/2951  ? Chronic kidney disease, stage 3a ([place]) 09/20/2021  ? History of CVA (cerebrovascular accident) 09/20/2021  ? Depression with anxiety 09/20/2021  ? CAD (coronary artery disease) 09/20/2021  ? Breast cancer ([place]) 09/20/2021  ? Diabetic infection of left foot ([place]) 09/20/2021   ? Cellulitis of lower extremity 09/20/2021  ? Hematoma 07/13/2021  ? MRSA infection 07/09/2021  ? Abscess 07/09/2021  ? Cellulitis of finger of left hand 06/23/2021  ? Folliculitis 88/41/6606  ? Headache disorder 03/08/2021  ? Dysuria 03/05/2021  ? Pressure sore 01/29/2021  ? Hyperlipidemia associated with type 2 diabetes mellitus ([place]) 01/13/2021  ? Fatigue 12/01/2020  ? Acute on chronic diastolic heart failure ([place]) 11/16/2020  ? Chronic migraine without aura without status migrainosus, not intractable 10/01/2020  ? Dizziness 10/01/2020  ? Microscopic hematuria 07/01/2020  ? Pulmonary nodules 07/01/2020  ? Hav (hallux abducto valgus), unspecified laterality 06/11/2020  ? Venous ulcer of ankle, left ([place]) 05/15/2020  ? Contracture of hand joint 05/15/2020  ? Acute kidney  injury superimposed on CKD ([place])   ? Hyperlipidemia   ? Carcinoma of lower-outer quadrant of right breast in female, estrogen receptor positive ([place]) 11/12/2019  ? Restrictive lung disease 10/08/2019  ? Postmenopausal bleeding 08/30/2019  ? Carotid stenosis, asymptomatic, right 08/16/2019  ? Nausea and vomiting 07/15/2019  ? Cellulitis of left lower extremity 06/04/2019  ? Multiple wounds of skin 06/04/2019  ? Other injury of unspecified body region, initial encounter 06/04/2019  ? (HFpEF) heart failure with preserved ejection fraction ([place]) 11/16/2018  ? Left-sided weakness 09/13/2018  ? Weakness of left lower extremity 09/05/2018  ? Recurrent falls 09/05/2018  ? PAD (peripheral artery disease) ([place]) 08/27/2018  ? Chronic congestive heart failure ([place]) 08/02/2018  ? Vaginal itching 06/15/2018  ? Chronic upper extremity pain (Secondary Area of Pain) (Bilateral) 04/23/2018  ? Diabetic polyneuropathy associated with type 2 diabetes mellitus ([place]) 04/23/2018  ? Long term prescription benzodiazepine use 04/23/2018  ? Neurogenic pain 04/23/2018  ? Vitamin D insufficiency 01/29/2018  ? Insulin dependent type 2 diabetes mellitus ([place]) 01/23/2018  ? DNR  (do not resuscitate) 01/23/2018  ? CKD stage 3 due to type 2 diabetes mellitus ([place]) 01/23/2018  ? IBS (irritable bowel syndrome) 01/19/2018  ? Chronic lower extremity pain (Primary Area of Pain) ([place]

## 2021-09-24 NOTE — Discharge Summary (Signed)
?Physician Discharge Summary ?  ?Patient: [name] MRN: [id number] DOB: 03-Jun-1971  ?Admit date:     09/20/2021  ?Discharge date: 09/23/2021  ?Discharge Physician: Dwyane Dee  ? ?PCP: Pleas Koch, NP  ? ?Recommendations at discharge:  ? ?Follow-up with podiatry ?Follow-up glucose logs and adjust insulin ? ?Discharge Diagnoses: ?Principal Problem: ?  Diabetic infection of left foot ([place]) ?Active Problems: ?  Cellulitis of lower extremity ?  PAD (peripheral artery disease) ([place]) ?  Diabetes mellitus without complication ([place]) ?  Tobacco abuse ?  COPD with chronic bronchitis ([place]) ?  Chronic diastolic CHF (congestive heart failure) ([place]) ?  Hyperlipidemia ?  HTN (hypertension) ?  Chronic kidney disease, stage 3a ([place]) ?  History of CVA (cerebrovascular accident) ?  Depression with anxiety ?  CAD (coronary artery disease) ?  Breast cancer ([place]) ? ?Resolved Problems: ?  * No resolved hospital problems. * ? ?Hospital Course: ?[name] is a 51 yo female with PMH PVD, HTN, HLD, DMII, COPD, asthma, CVA, GERD, depression/anxiety, tobacco abuse, seizure d/o, anemia, CAD, CKD3a, dCHF, right breast cancer (s/p mastectomy) who presented with a left foot ulcer/infection and B/L lower leg pain.  ?She has been followed outpatient by podiatry and was recently on doxycycline but states she feels that she developed a rash on her lower abdomen and inner thighs. ?Due to some drainage from her left heel ulcer and worsening pain, she presented for further evaluation. ?She underwent work-up on admission.  Left lower extremity duplex negative for DVT.  Left foot x-ray showed soft tissue ulceration on the posterior heel.  MRI left foot and heel showed heel ulceration but no drainable abscess or evidence of osteomyelitis. ?She underwent surgical debridement with podiatry on 09/21/2021.  She then underwent abdominal aortogram and left lower extremity angiogram with vascular surgery on 09/22/2021. ? ?Assessment and Plan: ?* Diabetic  infection of left foot (HCC) ?- ulceration noted on xray and MRI; no evidence of OM or abscess ?- s/p vanc on admission ?- continue on vanc/rocephin/flagyl (pharmacy has reviewed allergy profile with patient as well; has tolerated cephalosporin in the past) ?- s/p I&D on 3/14 with podiatry and angiogram with vascular surgery on 3/15 ?-Discharged with clindamycin to complete course (did not tolerate doxy previously and previous MRSA is resistant to bactrim) ? ?Cellulitis of lower extremity ?- Continue antibiotics as noted under diabetic foot infection ? ?PAD (peripheral artery disease) ([place]) ?- recent ABIs outpatient; VVS consulted on admission as well ?- underwent angiogram of abdomen and left lower extremity on 09/22/2021 with no significant stenosis nor stenting required ?-Continue aspirin and Crestor ? ?Diabetes mellitus without complication ([place]) ?- Continue current insulin regimen for now and will modify as necessary ?- discharged on Lantus and Novolog SSI ?- A1c 10.1% ?- Will not resume U-500 at discharge and instead will likely send with Lantus or equivalent.  I have discussed this with patient and husband at bedside regarding her need for insulin change and also making sure she is administering appropriately.  Previously had been injecting around abdominal scar ? ?Breast cancer ([place]) ?- Continue anastrozole ?- s/p right mastectomy  ? ?CAD (coronary artery disease) ?- Continue aspirin, Crestor ? ?Depression with anxiety ?- On Pristiq at home.  Currently on Effexor as substitution ? ?Chronic kidney disease, stage 3a ([place]) ?- patient has history of CKD3a. Baseline creat ~ 1 - 1.1, eGFR 50 ? ? ?HTN (hypertension) ?- Spironolactone appears to be only antihypertensive noted on med rec ?- Blood pressure currently  stable without need for medication ? ?Hyperlipidemia ?- Continue Crestor ? ?Chronic diastolic CHF (congestive heart failure) (HCC) ?- no s/s exacerbation  ? ?COPD with chronic bronchitis ([place]) ?- no s/s  exacerbation  ? ?Tobacco abuse ?- multiple recommendations have been made for smoking cessation  ?-Continue nicotine patch ? ? ?  ? ? ?Consultants: Podiatry, vascular surgery ?Procedures performed:  ?LLE I&D, 09/21/21 ?Abdominal aortogram and left lower extremity angiography, 09/22/2021 ? ?Disposition: Home ?Diet recommendation:  ?Discharge Diet Orders (From admission, onward)  ? ?  Start     Ordered  ? 09/23/21 0000  Diet Carb Modified       ? 09/23/21 1057  ? ?  ?  ? ?  ? ?Carb modified diet ?DISCHARGE MEDICATION: ?Allergies as of 09/23/2021   ? ?   Reactions  ? Adhesive [tape] Rash, Other (See Comments)  ? TAKES OFF THE SKIN (CERTAIN MEDICAL TAPES DO THIS!!)  ? Metoprolol Shortness Of Breath  ? Occurrence of shortness of breath after 3 days  ? Montelukast Shortness Of Breath  ? Morphine Sulfate Anaphylaxis, Shortness Of Breath, Nausea And Vomiting  ? Swollen Throat - Able to tolerate dilaudid  ? Penicillins Anaphylaxis, Hives, Shortness Of Breath  ? Throat swells ?Has patient had a PCN reaction causing immediate rash, facial/tongue/throat swelling, SOB or lightheadedness with hypotension: Yes ?Has patient had a PCN reaction causing severe rash involving mucus membranes or skin necrosis: No ?Has patient had a PCN reaction that required hospitalization: Yes ?Has patient had a PCN reaction occurring within the last 10 years: No ?If all of the above answers are "NO", then may proceed with Cephalosporin use.  ? Silicone Rash  ? Takes off the skin (certain medical tapes do this)  ? Diltiazem Swelling  ? Doxycycline Hives  ? Gabapentin Swelling  ? Midodrine   ? Lightheaded and falling down  ? ?  ? ?  ?Medication List  ?  ? ?STOP taking these medications   ? ?doxycycline 100 MG tablet ?Commonly known as: VIBRA-TABS ?  ?Global Inject Ease Insulin Syr 30G X 1/2" 1 ML Misc ?Generic drug: Insulin Syringe-Needle U-100 ?  ?HUMULIN R 500 UNIT/ML injection ?Generic drug: insulin regular human CONCENTRATED ?  ?traMADol 50 MG  tablet ?Commonly known as: Ultram ?  ? ?  ? ?TAKE these medications   ? ?acetaminophen 325 MG tablet ?Commonly known as: TYLENOL ?Take 2 tablets (650 mg total) by mouth every 4 (four) hours as needed for headache or mild pain. ?  ?albuterol 108 (90 Base) MCG/ACT inhaler ?Commonly known as: VENTOLIN HFA ?Inhale 2 puffs into the lungs every 6 (six) hours as needed for wheezing or shortness of breath. ?  ?ALPRAZolam 1 MG tablet ?Commonly known as: Duanne Moron ?Take 1 mg by mouth 4 (four) times daily as needed for anxiety. ?  ?anastrozole 1 MG tablet ?Commonly known as: ARIMIDEX ?Take 1 tablet (1 mg total) by mouth daily. ?  ?aspirin EC 81 MG tablet ?Take 81 mg by mouth daily before breakfast. ?  ?blood glucose meter kit and supplies ?Use up to four times daily as directed. (FOR ICD-10 E10.9, E11.9). ?  ?budesonide-formoterol 80-4.5 MCG/ACT inhaler ?Commonly known as: Symbicort ?INHALE 2 PUFFS BY MOUTH EVERY 12 HOURS TO PREVENT COUGH OR WHEEZING *RINSE MOUTH AFTER EACH USE* ?  ?busPIRone 30 MG tablet ?Commonly known as: BUSPAR ?Take 30 mg by mouth 2 (two) times daily. ?  ?citalopram 20 MG tablet ?Commonly known as: CELEXA ?Take 1 tablet (20 mg total)  by mouth daily. ?  ?clindamycin 300 MG capsule ?Commonly known as: CLEOCIN ?Take 1 capsule (300 mg total) by mouth 4 (four) times daily for 5 days. ?  ?desvenlafaxine 100 MG 24 hr tablet ?Commonly known as: PRISTIQ ?Take 100 mg by mouth daily. ?  ?Empagliflozin-metFORMIN HCl ER 25-1000 MG Tb24 ?Take 1 tablet by mouth daily with breakfast. ?  ?Eylea 2 MG/0.05ML Sosy ?Generic drug: Aflibercept ?  ?gentamicin cream 0.1 % ?Commonly known as: GARAMYCIN ?Apply 1 application. topically daily. ?  ?glucose blood test strip ?Use as instructed ?  ?insulin aspart 100 UNIT/ML FlexPen ?Commonly known as: NOVOLOG ?Inject 1-9 Units into the skin 4 (four) times daily -  before meals and at bedtime. Glucose 121 - 150: 1 unit, Glucose 151 - 200: 2 units, Glucose 201 - 250: 3 units, Glucose 251 -  300: 5 units, Glucose 301 - 350: 7 units, Glucose 351 - 400: 9 units, Glucose > 400 call MD ?  ?insulin glargine 100 UNIT/ML Solostar Pen ?Commonly known as: LANTUS ?Inject 25 Units into the skin daily. ?  ?Insul

## 2021-09-25 LAB — CULTURE, BLOOD (ROUTINE X 2)
Culture: NO GROWTH
Culture: NO GROWTH
Special Requests: ADEQUATE
Special Requests: ADEQUATE

## 2021-09-27 NOTE — Patient Instructions (Signed)
Visit Information  Thank you for allowing me to share the care management and care coordination services that are available to you as part of your health plan and services through your primary care provider and medical home. Please reach out to me at [phone]  if the care management/care coordination team may be of assistance to you in the future.   Enedelia Martorelli RN,BSN,CCM RN Case Manager [facility]  [phone]  

## 2021-09-29 ENCOUNTER — Telehealth: Payer: Self-pay | Admitting: *Deleted

## 2021-09-29 ENCOUNTER — Encounter: Payer: Medicare Other | Admitting: Vascular Surgery

## 2021-09-29 NOTE — Telephone Encounter (Signed)
"  My rash was there when I left the hospital.  They told me to show it when I went to [facility].  It's on my whole body.  It itches when it comes up.  I try not to scratch it because it leave a little whelp.  I left the hospital last Wednesday / Thursday.  I haven't been putting anything on it.  I have been taking Benadryl.  It's not helping because I keep taking the medication.  I don't know if the pain pills is causing it or the antibiotic.  I took my last pain pill this morning.  I need a refill but I prefer a different kind of medicine.  The strength helped me.  Send the prescription to [facility] on [address]." ?

## 2021-09-29 NOTE — Telephone Encounter (Signed)
"  I have a rash all over my body from either taking the antibiotic or the pain medication.  I've been tolerating it but it seems to be spreading."  I will let Dr. Amalia Hailey know.  His nurse will give you a call back. ?

## 2021-09-30 NOTE — Telephone Encounter (Signed)
It is likely coming from the antibiotic.  Discontinue the antibiotic for now.  I will reevaluate tomorrow when she comes into the office.  Please notify patient.  Thanks, Dr. Amalia Hailey

## 2021-09-30 NOTE — Telephone Encounter (Signed)
I am calling to let you know that Dr. Amalia Hailey said to stop taking the antibiotic.  He said that may be what's causing the rash. He said he'll see you tomorrow.  "Okay, did he call me in anymore pain medicine or will he let me know about that tomorrow?"  He'll let you know tomorrow. ?

## 2021-10-01 ENCOUNTER — Ambulatory Visit (INDEPENDENT_AMBULATORY_CARE_PROVIDER_SITE_OTHER): Payer: BC Managed Care – PPO | Admitting: Podiatry

## 2021-10-01 ENCOUNTER — Encounter: Payer: Medicare Other | Admitting: Podiatry

## 2021-10-01 ENCOUNTER — Other Ambulatory Visit: Payer: Self-pay

## 2021-10-01 ENCOUNTER — Encounter: Payer: Self-pay | Admitting: Podiatry

## 2021-10-01 DIAGNOSIS — L97522 Non-pressure chronic ulcer of other part of left foot with fat layer exposed: Secondary | ICD-10-CM | POA: Diagnosis not present

## 2021-10-01 DIAGNOSIS — E0843 Diabetes mellitus due to underlying condition with diabetic autonomic (poly)neuropathy: Secondary | ICD-10-CM | POA: Diagnosis not present

## 2021-10-01 DIAGNOSIS — I739 Peripheral vascular disease, unspecified: Secondary | ICD-10-CM

## 2021-10-01 MED ORDER — OXYCODONE-ACETAMINOPHEN 5-325 MG PO TABS: 1.0000 | ORAL_TABLET | Freq: Four times a day (QID) | ORAL | 0 refills | Status: DC | PRN

## 2021-10-01 NOTE — Progress Notes (Signed)
? ?Subjective:  ?Patient presents today for follow-up outpatient care status post excisional debridement of bilateral ulcers with application of ACell wound matrix. DOS: 09/21/2021.  Patient states that she is doing well.  She has kept the dressings clean and dry to the right lower extremity.  She presents for further treatment and evaluation ? ?Past Medical History:  ?Diagnosis Date  ? Anxiety   ? takes Prozac daily  ? Anxiety   ? Aortic valve calcification   ? Asthma   ? Advair and Spirva daily  ? Asthma   ? Bipolar disorder ([place])   ? Breast cancer, right breast ([place]) 12/23/2019  ? CAD (coronary artery disease)   ? a. LHC 11/2013 done for CP/fluid retention: mild disease in prox LAD, mild-mod disease in mRCA, EF 60% with normal LVEDP. b. Normal nuc 03/2016.  ? Cancer [facility])   ? Cellulitis and abscess of trunk 03/12/2020  ? CHF (congestive heart failure) ([place])   ? Chronic diastolic CHF (congestive heart failure) ([place])   ? Chronic heart failure with preserved ejection fraction ([place]) 11/16/2018  ? CKD (chronic kidney disease), stage II   ? COPD (chronic obstructive pulmonary disease) ([place])   ? a. nocturnal O2.  ? COPD (chronic obstructive pulmonary disease) ([place])   ? Coronary artery disease   ? Decreased urine stream   ? Diabetes mellitus   ? Diabetes mellitus without complication ([place])   ? Dyspnea   ? Elevated C-reactive protein (CRP) 01/29/2018  ? Elevated sedimentation rate 01/29/2018  ? Elevated serum hCG 01/19/2018  ? Family history of adverse reaction to anesthesia   ? mom gets nauseated  ? Foot pain, bilateral 05/22/2018  ? GERD (gastroesophageal reflux disease)   ? takes Pepcid daily  ? History of blood clots   ? left leg 3-23yr ago  ? Hyperlipidemia   ? Hypertension   ? Hypertriglyceridemia   ? Inguinal hernia, left 01/2015  ? Muscle spasm   ? Open wound of genital labia   ? Peripheral neuropathy   ? RBBB   ? Seizures ([place]   ? Sepsis ([place] 01/19/2018  ? Sinus tachycardia   ? a. persistent since 2009.  ? Smokers' cough  ([place]   ? Status post mastectomy, right 01/14/2020  ? Stroke ([facility] 1989  ? left sided weakness  ? TIA (transient ischemic attack)   ? Tobacco abuse   ? Vulvar abscess 01/23/2018  ? ? ?Objective/Physical Exam ?Ulcers noted which appear to be dry and stable.  Minimal drainage.  No malodor.  skin is cool to touch.  History of PVD with lower extremity angiography 09/22/2021 inpatient. ? ?Assessment: ?1. s/p excisional debridement of bilateral ulcers with application of ACell wound matrix Graft. DOS: 09/21/2021 ?2.  Lower extremity angiography 09/22/2021 ? ? ?Plan of Care:  ?1. Patient was evaluated.  ?2.  Dressings changed.  Clean dry intact x1 week ?3.  Order placed for DCapital One?4. Refill Percocet 5/325 mg ?5.  Currently there is no clinical evidence of acute infection.  Patient had allergic reaction to clindamycin that was prescribed upon discharge.  Patient has allergies to multiple antibiotic medications. ?6.  Return to clinic 1 week, at this time we will likely refer the patient over the wound care center ? ?BEdrick Kins DPM ?[place]? ?Dr. BEdrick Kins DPM  ?  ?[address]                                    ?  [place], [place] [address]                ?Office [phone]  ?Fax [phone] ? ? ? ? ? ?

## 2021-10-04 ENCOUNTER — Telehealth: Payer: Self-pay | Admitting: Podiatry

## 2021-10-04 NOTE — Telephone Encounter (Signed)
Called patient to inform of defender boot, but left voice mail that I will fax paperwork to company for Defender boot once [place] (rep) emails me the forms to be filled out.   ?[name] is emailing forms today ?

## 2021-10-04 NOTE — Telephone Encounter (Signed)
Patient [name] on after hours VM that she is having issues with the shoe/boot you gave to her on Friday. Patient is asking for Dr Amalia Hailey or the nurse to call her. [phone]. ?

## 2021-10-04 NOTE — Telephone Encounter (Signed)
Angie will you please contact patient and answer questions.  Also please notify patient that we are ordering a new Defender Boot for the patient.  Hopefully I will be here within the next week.  Thanks, Dr. Amalia Hailey

## 2021-10-06 ENCOUNTER — Inpatient Hospital Stay: Payer: BC Managed Care – PPO

## 2021-10-06 ENCOUNTER — Inpatient Hospital Stay: Payer: BC Managed Care – PPO | Admitting: Licensed Clinical Social Worker

## 2021-10-06 ENCOUNTER — Ambulatory Visit: Payer: Medicare Other | Admitting: Podiatry

## 2021-10-07 ENCOUNTER — Other Ambulatory Visit: Payer: Self-pay

## 2021-10-07 ENCOUNTER — Emergency Department: Payer: BC Managed Care – PPO

## 2021-10-07 ENCOUNTER — Inpatient Hospital Stay
Admission: EM | Admit: 2021-10-07 | Discharge: 2021-10-14 | DRG: 854 | Disposition: A | Discharge status: Skilled Nursing Facility | Payer: BC Managed Care – PPO | Attending: Internal Medicine | Admitting: Internal Medicine

## 2021-10-07 DIAGNOSIS — I452 Bifascicular block: Secondary | ICD-10-CM | POA: Diagnosis present

## 2021-10-07 DIAGNOSIS — L0390 Cellulitis, unspecified: Secondary | ICD-10-CM | POA: Insufficient documentation

## 2021-10-07 DIAGNOSIS — J449 Chronic obstructive pulmonary disease, unspecified: Secondary | ICD-10-CM | POA: Diagnosis not present

## 2021-10-07 DIAGNOSIS — Z4781 Encounter for orthopedic aftercare following surgical amputation: Secondary | ICD-10-CM | POA: Diagnosis not present

## 2021-10-07 DIAGNOSIS — Z885 Allergy status to narcotic agent status: Secondary | ICD-10-CM

## 2021-10-07 DIAGNOSIS — R739 Hyperglycemia, unspecified: Secondary | ICD-10-CM | POA: Diagnosis not present

## 2021-10-07 DIAGNOSIS — E1165 Type 2 diabetes mellitus with hyperglycemia: Secondary | ICD-10-CM | POA: Diagnosis not present

## 2021-10-07 DIAGNOSIS — Z170 Estrogen receptor positive status [ER+]: Secondary | ICD-10-CM | POA: Diagnosis not present

## 2021-10-07 DIAGNOSIS — Z888 Allergy status to other drugs, medicaments and biological substances status: Secondary | ICD-10-CM

## 2021-10-07 DIAGNOSIS — L270 Generalized skin eruption due to drugs and medicaments taken internally: Secondary | ICD-10-CM | POA: Diagnosis present

## 2021-10-07 DIAGNOSIS — E1169 Type 2 diabetes mellitus with other specified complication: Secondary | ICD-10-CM | POA: Diagnosis present

## 2021-10-07 DIAGNOSIS — E11628 Type 2 diabetes mellitus with other skin complications: Secondary | ICD-10-CM | POA: Diagnosis present

## 2021-10-07 DIAGNOSIS — R0902 Hypoxemia: Secondary | ICD-10-CM | POA: Diagnosis not present

## 2021-10-07 DIAGNOSIS — L03116 Cellulitis of left lower limb: Secondary | ICD-10-CM | POA: Diagnosis not present

## 2021-10-07 DIAGNOSIS — J9610 Chronic respiratory failure, unspecified whether with hypoxia or hypercapnia: Secondary | ICD-10-CM | POA: Diagnosis present

## 2021-10-07 DIAGNOSIS — Z825 Family history of asthma and other chronic lower respiratory diseases: Secondary | ICD-10-CM

## 2021-10-07 DIAGNOSIS — E1142 Type 2 diabetes mellitus with diabetic polyneuropathy: Secondary | ICD-10-CM | POA: Diagnosis present

## 2021-10-07 DIAGNOSIS — I130 Hypertensive heart and chronic kidney disease with heart failure and stage 1 through stage 4 chronic kidney disease, or unspecified chronic kidney disease: Secondary | ICD-10-CM | POA: Diagnosis present

## 2021-10-07 DIAGNOSIS — L97523 Non-pressure chronic ulcer of other part of left foot with necrosis of muscle: Secondary | ICD-10-CM | POA: Diagnosis present

## 2021-10-07 DIAGNOSIS — Z833 Family history of diabetes mellitus: Secondary | ICD-10-CM

## 2021-10-07 DIAGNOSIS — Z6839 Body mass index (BMI) 39.0-39.9, adult: Secondary | ICD-10-CM

## 2021-10-07 DIAGNOSIS — I4510 Unspecified right bundle-branch block: Secondary | ICD-10-CM | POA: Diagnosis present

## 2021-10-07 DIAGNOSIS — I2510 Atherosclerotic heart disease of native coronary artery without angina pectoris: Secondary | ICD-10-CM | POA: Diagnosis present

## 2021-10-07 DIAGNOSIS — F419 Anxiety disorder, unspecified: Secondary | ICD-10-CM | POA: Diagnosis present

## 2021-10-07 DIAGNOSIS — Z7951 Long term (current) use of inhaled steroids: Secondary | ICD-10-CM

## 2021-10-07 DIAGNOSIS — F17210 Nicotine dependence, cigarettes, uncomplicated: Secondary | ICD-10-CM | POA: Diagnosis present

## 2021-10-07 DIAGNOSIS — I10 Essential (primary) hypertension: Secondary | ICD-10-CM | POA: Diagnosis not present

## 2021-10-07 DIAGNOSIS — A419 Sepsis, unspecified organism: Secondary | ICD-10-CM

## 2021-10-07 DIAGNOSIS — I70262 Atherosclerosis of native arteries of extremities with gangrene, left leg: Secondary | ICD-10-CM | POA: Diagnosis not present

## 2021-10-07 DIAGNOSIS — A4902 Methicillin resistant Staphylococcus aureus infection, unspecified site: Secondary | ICD-10-CM

## 2021-10-07 DIAGNOSIS — I330 Acute and subacute infective endocarditis: Secondary | ICD-10-CM | POA: Diagnosis not present

## 2021-10-07 DIAGNOSIS — E1152 Type 2 diabetes mellitus with diabetic peripheral angiopathy with gangrene: Secondary | ICD-10-CM | POA: Diagnosis present

## 2021-10-07 DIAGNOSIS — F319 Bipolar disorder, unspecified: Secondary | ICD-10-CM | POA: Diagnosis present

## 2021-10-07 DIAGNOSIS — Z794 Long term (current) use of insulin: Secondary | ICD-10-CM

## 2021-10-07 DIAGNOSIS — I509 Heart failure, unspecified: Secondary | ICD-10-CM | POA: Diagnosis not present

## 2021-10-07 DIAGNOSIS — R7881 Bacteremia: Secondary | ICD-10-CM | POA: Diagnosis not present

## 2021-10-07 DIAGNOSIS — D631 Anemia in chronic kidney disease: Secondary | ICD-10-CM | POA: Diagnosis not present

## 2021-10-07 DIAGNOSIS — Z803 Family history of malignant neoplasm of breast: Secondary | ICD-10-CM

## 2021-10-07 DIAGNOSIS — Z853 Personal history of malignant neoplasm of breast: Secondary | ICD-10-CM | POA: Diagnosis not present

## 2021-10-07 DIAGNOSIS — A4102 Sepsis due to Methicillin resistant Staphylococcus aureus: Principal | ICD-10-CM | POA: Diagnosis present

## 2021-10-07 DIAGNOSIS — M86172 Other acute osteomyelitis, left ankle and foot: Secondary | ICD-10-CM | POA: Diagnosis not present

## 2021-10-07 DIAGNOSIS — N1831 Chronic kidney disease, stage 3a: Secondary | ICD-10-CM | POA: Diagnosis not present

## 2021-10-07 DIAGNOSIS — K219 Gastro-esophageal reflux disease without esophagitis: Secondary | ICD-10-CM | POA: Diagnosis present

## 2021-10-07 DIAGNOSIS — Z1159 Encounter for screening for other viral diseases: Secondary | ICD-10-CM | POA: Diagnosis not present

## 2021-10-07 DIAGNOSIS — G40909 Epilepsy, unspecified, not intractable, without status epilepticus: Secondary | ICD-10-CM | POA: Diagnosis not present

## 2021-10-07 DIAGNOSIS — I339 Acute and subacute endocarditis, unspecified: Secondary | ICD-10-CM | POA: Diagnosis not present

## 2021-10-07 DIAGNOSIS — T7840XA Allergy, unspecified, initial encounter: Secondary | ICD-10-CM

## 2021-10-07 DIAGNOSIS — I5032 Chronic diastolic (congestive) heart failure: Secondary | ICD-10-CM | POA: Diagnosis not present

## 2021-10-07 DIAGNOSIS — I739 Peripheral vascular disease, unspecified: Secondary | ICD-10-CM | POA: Diagnosis not present

## 2021-10-07 DIAGNOSIS — Z89512 Acquired absence of left leg below knee: Secondary | ICD-10-CM | POA: Diagnosis not present

## 2021-10-07 DIAGNOSIS — Z7982 Long term (current) use of aspirin: Secondary | ICD-10-CM

## 2021-10-07 DIAGNOSIS — R000 Tachycardia, unspecified: Secondary | ICD-10-CM | POA: Diagnosis not present

## 2021-10-07 DIAGNOSIS — E1129 Type 2 diabetes mellitus with other diabetic kidney complication: Secondary | ICD-10-CM | POA: Diagnosis not present

## 2021-10-07 DIAGNOSIS — E871 Hypo-osmolality and hyponatremia: Secondary | ICD-10-CM | POA: Diagnosis present

## 2021-10-07 DIAGNOSIS — C50911 Malignant neoplasm of unspecified site of right female breast: Secondary | ICD-10-CM | POA: Diagnosis not present

## 2021-10-07 DIAGNOSIS — L089 Local infection of the skin and subcutaneous tissue, unspecified: Secondary | ICD-10-CM | POA: Diagnosis not present

## 2021-10-07 DIAGNOSIS — Z9981 Dependence on supplemental oxygen: Secondary | ICD-10-CM

## 2021-10-07 DIAGNOSIS — I110 Hypertensive heart disease with heart failure: Secondary | ICD-10-CM | POA: Diagnosis not present

## 2021-10-07 DIAGNOSIS — J410 Simple chronic bronchitis: Secondary | ICD-10-CM | POA: Diagnosis not present

## 2021-10-07 DIAGNOSIS — L97423 Non-pressure chronic ulcer of left heel and midfoot with necrosis of muscle: Secondary | ICD-10-CM | POA: Diagnosis not present

## 2021-10-07 DIAGNOSIS — B9562 Methicillin resistant Staphylococcus aureus infection as the cause of diseases classified elsewhere: Secondary | ICD-10-CM | POA: Diagnosis not present

## 2021-10-07 DIAGNOSIS — M8618 Other acute osteomyelitis, other site: Secondary | ICD-10-CM | POA: Diagnosis present

## 2021-10-07 DIAGNOSIS — Z8249 Family history of ischemic heart disease and other diseases of the circulatory system: Secondary | ICD-10-CM

## 2021-10-07 DIAGNOSIS — Z880 Allergy status to penicillin: Secondary | ICD-10-CM

## 2021-10-07 DIAGNOSIS — Z79811 Long term (current) use of aromatase inhibitors: Secondary | ICD-10-CM

## 2021-10-07 DIAGNOSIS — T07XXXA Unspecified multiple injuries, initial encounter: Secondary | ICD-10-CM | POA: Diagnosis not present

## 2021-10-07 DIAGNOSIS — Z7984 Long term (current) use of oral hypoglycemic drugs: Secondary | ICD-10-CM

## 2021-10-07 DIAGNOSIS — R531 Weakness: Principal | ICD-10-CM

## 2021-10-07 DIAGNOSIS — J45909 Unspecified asthma, uncomplicated: Secondary | ICD-10-CM | POA: Diagnosis not present

## 2021-10-07 DIAGNOSIS — F17200 Nicotine dependence, unspecified, uncomplicated: Secondary | ICD-10-CM | POA: Diagnosis present

## 2021-10-07 DIAGNOSIS — Z9011 Acquired absence of right breast and nipple: Secondary | ICD-10-CM

## 2021-10-07 DIAGNOSIS — Z720 Tobacco use: Secondary | ICD-10-CM | POA: Diagnosis present

## 2021-10-07 DIAGNOSIS — I959 Hypotension, unspecified: Secondary | ICD-10-CM | POA: Diagnosis not present

## 2021-10-07 DIAGNOSIS — E1122 Type 2 diabetes mellitus with diabetic chronic kidney disease: Secondary | ICD-10-CM | POA: Diagnosis present

## 2021-10-07 DIAGNOSIS — E781 Pure hyperglyceridemia: Secondary | ICD-10-CM | POA: Diagnosis present

## 2021-10-07 DIAGNOSIS — C50919 Malignant neoplasm of unspecified site of unspecified female breast: Secondary | ICD-10-CM | POA: Diagnosis present

## 2021-10-07 DIAGNOSIS — I96 Gangrene, not elsewhere classified: Secondary | ICD-10-CM | POA: Diagnosis not present

## 2021-10-07 DIAGNOSIS — F418 Other specified anxiety disorders: Secondary | ICD-10-CM | POA: Diagnosis not present

## 2021-10-07 DIAGNOSIS — Z79899 Other long term (current) drug therapy: Secondary | ICD-10-CM

## 2021-10-07 DIAGNOSIS — T368X5A Adverse effect of other systemic antibiotics, initial encounter: Secondary | ICD-10-CM | POA: Diagnosis present

## 2021-10-07 DIAGNOSIS — M7989 Other specified soft tissue disorders: Secondary | ICD-10-CM | POA: Diagnosis not present

## 2021-10-07 DIAGNOSIS — Z8673 Personal history of transient ischemic attack (TIA), and cerebral infarction without residual deficits: Secondary | ICD-10-CM

## 2021-10-07 HISTORY — DX: Non-pressure chronic ulcer of other part of left foot with necrosis of muscle: L97.523

## 2021-10-07 LAB — GLUCOSE, CAPILLARY: Glucose-Capillary: 271 mg/dL — ABNORMAL HIGH (ref 70–99)

## 2021-10-07 LAB — URINALYSIS, COMPLETE (UACMP) WITH MICROSCOPIC
Bilirubin Urine: NEGATIVE
Glucose, UA: >=500 mg/dL — AB
Ketones, ur: NEGATIVE mg/dL
Leukocytes,Ua: NEGATIVE
Nitrite: NEGATIVE
Protein, ur: 30 mg/dL — AB
Specific Gravity, Urine: 1.008 (ref 1.005–1.030)
pH: 5 (ref 5.0–8.0)

## 2021-10-07 LAB — BASIC METABOLIC PANEL
Anion gap: 10 (ref 5–15)
BUN: 29 mg/dL — ABNORMAL HIGH (ref 6–20)
CO2: 25 mmol/L (ref 22–32)
Calcium: 8.3 mg/dL — ABNORMAL LOW (ref 8.9–10.3)
Chloride: 91 mmol/L — ABNORMAL LOW (ref 98–111)
Creatinine, Ser: 1.23 mg/dL — ABNORMAL HIGH (ref 0.44–1.00)
GFR, Estimated: 53 mL/min — ABNORMAL LOW (ref >60)
Glucose, Bld: 428 mg/dL — ABNORMAL HIGH (ref 70–99)
Potassium: 4.4 mmol/L (ref 3.5–5.1)
Sodium: 126 mmol/L — ABNORMAL LOW (ref 135–145)

## 2021-10-07 LAB — LACTIC ACID, PLASMA: Lactic Acid, Venous: 1.8 mmol/L (ref 0.5–1.9)

## 2021-10-07 LAB — CBC
HCT: 32.6 % — ABNORMAL LOW (ref 36.0–46.0)
Hemoglobin: 10.1 g/dL — ABNORMAL LOW (ref 12.0–15.0)
MCH: 25.9 pg — ABNORMAL LOW (ref 26.0–34.0)
MCHC: 31 g/dL (ref 30.0–36.0)
MCV: 83.6 fL (ref 80.0–100.0)
Platelets: 261 10*3/uL (ref 150–400)
RBC: 3.9 MIL/uL (ref 3.87–5.11)
RDW: 15.4 % (ref 11.5–15.5)
WBC: 17.9 10*3/uL — ABNORMAL HIGH (ref 4.0–10.5)
nRBC: 0 % (ref 0.0–0.2)

## 2021-10-07 LAB — CBG MONITORING, ED
Glucose-Capillary: 415 mg/dL — ABNORMAL HIGH (ref 70–99)
Glucose-Capillary: 317 mg/dL — ABNORMAL HIGH (ref 70–99)

## 2021-10-07 MED ORDER — VANCOMYCIN HCL IN DEXTROSE 1-5 GM/200ML-% IV SOLN
1000.0000 mg | Freq: Once | INTRAVENOUS | Status: AC
Start: 2021-10-07 — End: 2021-10-07
  Administered 2021-10-07: 1000 mg via INTRAVENOUS
  Filled 2021-10-07: qty 200

## 2021-10-07 MED ORDER — MOMETASONE FURO-FORMOTEROL FUM 100-5 MCG/ACT IN AERO
2.0000 | INHALATION_SPRAY | Freq: Two times a day (BID) | RESPIRATORY_TRACT | Status: DC
  Administered 2021-10-08 – 2021-10-14 (×13): 2 via RESPIRATORY_TRACT
  Filled 2021-10-07: qty 8.8

## 2021-10-07 MED ORDER — CEFTRIAXONE SODIUM 2 G IJ SOLR
2.0000 g | Freq: Every day | INTRAMUSCULAR | Status: DC
  Administered 2021-10-07: 2 g via INTRAVENOUS
  Filled 2021-10-07 (×2): qty 20

## 2021-10-07 MED ORDER — ONDANSETRON HCL 4 MG PO TABS: 4.0000 mg | ORAL_TABLET | Freq: Four times a day (QID) | ORAL | Status: DC | PRN

## 2021-10-07 MED ORDER — ACETAMINOPHEN 325 MG PO TABS
650.0000 mg | ORAL_TABLET | ORAL | Status: DC | PRN
  Administered 2021-10-11: 650 mg via ORAL

## 2021-10-07 MED ORDER — NICOTINE 14 MG/24HR TD PT24
14.0000 mg | MEDICATED_PATCH | Freq: Every day | TRANSDERMAL | Status: DC
Start: 2021-10-07 — End: 2021-10-14
  Administered 2021-10-07 – 2021-10-14 (×8): 14 mg via TRANSDERMAL
  Filled 2021-10-07 (×8): qty 1

## 2021-10-07 MED ORDER — ALPRAZOLAM 1 MG PO TABS
1.0000 mg | ORAL_TABLET | Freq: Four times a day (QID) | ORAL | Status: DC | PRN
  Administered 2021-10-07 – 2021-10-14 (×8): 1 mg via ORAL
  Filled 2021-10-07 (×9): qty 1

## 2021-10-07 MED ORDER — ENOXAPARIN SODIUM 60 MG/0.6ML IJ SOSY
0.5000 mg/kg | PREFILLED_SYRINGE | INTRAMUSCULAR | Status: DC
  Administered 2021-10-09 – 2021-10-13 (×4): 52.5 mg via SUBCUTANEOUS
  Filled 2021-10-07 (×6): qty 0.6

## 2021-10-07 MED ORDER — CITALOPRAM HYDROBROMIDE 10 MG PO TABS
20.0000 mg | ORAL_TABLET | Freq: Every day | ORAL | Status: DC
  Administered 2021-10-08 – 2021-10-14 (×7): 20 mg via ORAL
  Filled 2021-10-07 (×7): qty 2

## 2021-10-07 MED ORDER — ALBUTEROL SULFATE (2.5 MG/3ML) 0.083% IN NEBU: 3.0000 mL | INHALATION_SOLUTION | Freq: Four times a day (QID) | RESPIRATORY_TRACT | Status: DC | PRN

## 2021-10-07 MED ORDER — DESVENLAFAXINE SUCCINATE ER 100 MG PO TB24
100.0000 mg | ORAL_TABLET | Freq: Every day | ORAL | Status: DC
  Administered 2021-10-08 – 2021-10-14 (×6): 100 mg via ORAL
  Filled 2021-10-07 (×8): qty 1

## 2021-10-07 MED ORDER — TRAZODONE HCL 100 MG PO TABS
200.0000 mg | ORAL_TABLET | Freq: Every day | ORAL | Status: DC
  Filled 2021-10-07 (×3): qty 2

## 2021-10-07 MED ORDER — SODIUM CHLORIDE 0.9 % IV SOLN
INTRAVENOUS | Status: DC
Start: 2021-10-07 — End: 2021-10-08

## 2021-10-07 MED ORDER — ASPIRIN EC 81 MG PO TBEC
81.0000 mg | DELAYED_RELEASE_TABLET | Freq: Every day | ORAL | Status: DC
  Administered 2021-10-08 – 2021-10-14 (×6): 81 mg via ORAL
  Filled 2021-10-07 (×6): qty 1

## 2021-10-07 MED ORDER — SODIUM CHLORIDE 0.9 % IV SOLN: INTRAVENOUS | Status: DC

## 2021-10-07 MED ORDER — INSULIN GLARGINE-YFGN 100 UNIT/ML ~~LOC~~ SOLN
25.0000 [IU] | Freq: Every day | SUBCUTANEOUS | Status: DC
  Administered 2021-10-07 – 2021-10-08 (×2): 25 [IU] via SUBCUTANEOUS
  Filled 2021-10-07 (×3): qty 0.25

## 2021-10-07 MED ORDER — ROSUVASTATIN CALCIUM 10 MG PO TABS
20.0000 mg | ORAL_TABLET | Freq: Every day | ORAL | Status: DC
  Administered 2021-10-07 – 2021-10-12 (×6): 20 mg via ORAL
  Filled 2021-10-07: qty 1
  Filled 2021-10-07 (×6): qty 2

## 2021-10-07 MED ORDER — SPIRONOLACTONE 25 MG PO TABS
50.0000 mg | ORAL_TABLET | Freq: Every day | ORAL | Status: DC
  Administered 2021-10-08 – 2021-10-11 (×4): 50 mg via ORAL
  Filled 2021-10-07 (×5): qty 2

## 2021-10-07 MED ORDER — CLINDAMYCIN PHOSPHATE 600 MG/50ML IV SOLN
600.0000 mg | Freq: Three times a day (TID) | INTRAVENOUS | Status: DC
  Filled 2021-10-07: qty 50

## 2021-10-07 MED ORDER — ANASTROZOLE 1 MG PO TABS
1.0000 mg | ORAL_TABLET | Freq: Every day | ORAL | Status: DC
Start: 2021-10-08 — End: 2021-10-14
  Administered 2021-10-08 – 2021-10-14 (×6): 1 mg via ORAL
  Filled 2021-10-07 (×8): qty 1

## 2021-10-07 MED ORDER — PREGABALIN 75 MG PO CAPS
300.0000 mg | ORAL_CAPSULE | Freq: Two times a day (BID) | ORAL | Status: DC
  Administered 2021-10-07 – 2021-10-14 (×14): 300 mg via ORAL
  Filled 2021-10-07 (×14): qty 4

## 2021-10-07 MED ORDER — ONDANSETRON HCL 4 MG/2ML IJ SOLN: 4.0000 mg | Freq: Four times a day (QID) | INTRAMUSCULAR | Status: DC | PRN

## 2021-10-07 MED ORDER — OXYCODONE-ACETAMINOPHEN 5-325 MG PO TABS
1.0000 | ORAL_TABLET | Freq: Four times a day (QID) | ORAL | Status: DC | PRN
Start: 2021-10-07 — End: 2021-10-11

## 2021-10-07 MED ORDER — INSULIN ASPART 100 UNIT/ML IJ SOLN
0.0000 [IU] | Freq: Three times a day (TID) | INTRAMUSCULAR | Status: DC
  Administered 2021-10-07: 11 [IU] via SUBCUTANEOUS
  Administered 2021-10-08: 3 [IU] via SUBCUTANEOUS
  Administered 2021-10-08: 5 [IU] via SUBCUTANEOUS
  Administered 2021-10-08: 8 [IU] via SUBCUTANEOUS
  Administered 2021-10-09 (×2): 15 [IU] via SUBCUTANEOUS
  Administered 2021-10-10: 8 [IU] via SUBCUTANEOUS
  Administered 2021-10-10: 11 [IU] via SUBCUTANEOUS
  Administered 2021-10-10 – 2021-10-11 (×3): 8 [IU] via SUBCUTANEOUS
  Administered 2021-10-12 (×2): 15 [IU] via SUBCUTANEOUS
  Administered 2021-10-13: 11 [IU] via SUBCUTANEOUS
  Administered 2021-10-13: 20 [IU] via SUBCUTANEOUS
  Administered 2021-10-13 – 2021-10-14 (×2): 3 [IU] via SUBCUTANEOUS
  Administered 2021-10-14: 8 [IU] via SUBCUTANEOUS
  Filled 2021-10-07 (×17): qty 1

## 2021-10-07 MED ORDER — BUSPIRONE HCL 15 MG PO TABS
30.0000 mg | ORAL_TABLET | Freq: Two times a day (BID) | ORAL | Status: DC
  Administered 2021-10-07 – 2021-10-14 (×14): 30 mg via ORAL
  Filled 2021-10-07 (×6): qty 3
  Filled 2021-10-07 (×2): qty 2
  Filled 2021-10-07 (×2): qty 3
  Filled 2021-10-07: qty 2
  Filled 2021-10-07 (×2): qty 3
  Filled 2021-10-07: qty 2
  Filled 2021-10-07: qty 3

## 2021-10-07 MED ORDER — ROPINIROLE HCL 1 MG PO TABS
1.0000 mg | ORAL_TABLET | Freq: Every day | ORAL | Status: DC
  Administered 2021-10-07 – 2021-10-13 (×7): 1 mg via ORAL
  Filled 2021-10-07 (×8): qty 1

## 2021-10-07 NOTE — Progress Notes (Signed)
PHARMACIST - PHYSICIAN COMMUNICATION ? ?CONCERNING:  Enoxaparin (Lovenox) for DVT Prophylaxis  ? ? ?RECOMMENDATION: ?Patient was prescribed enoxaparin '40mg'$  q24 hours for VTE prophylaxis.  ? ?Filed Weights  ? 10/07/21 1344  ?Weight: 104.3 kg (230 lb)  ? ? ?Body mass index is 39.48 kg/m?. ? ?Estimated Creatinine Clearance: 62.9 mL/min (A) (by C-G formula based on SCr of 1.23 mg/dL (H)). ? ? ?Based on [place] patient is candidate for enoxaparin 0.'5mg'$ /kg TBW SQ every 24 hours based on BMI being >30. ? ?DESCRIPTION: ?Pharmacy has adjusted enoxaparin dose per [facility] policy. ? ?Patient is now receiving enoxaparin 52.5 mg every 24 hours  ? ? ?Benita Gutter ?10/07/2021 ?3:01 PM  ?

## 2021-10-07 NOTE — Assessment & Plan Note (Addendum)
Now resolved.  

## 2021-10-07 NOTE — ED Notes (Signed)
Bg 317 ?

## 2021-10-07 NOTE — Sepsis Progress Note (Signed)
Notified bedside nurse of need to draw lactic acid and the blood cultures.  ?

## 2021-10-07 NOTE — ED Notes (Addendum)
Pt with red raised rash on abdomen and legs, pt states she thinks this is from taking percocet. Pt's boot and dressing removed by MD. Pt with open wound to top of left  foot ( 8 cm long, 5.5 cm wide, necrotic) and necrotic wound to bottom of left heel (6cm long and 3 cm wide, necrotic,). Pt's left heel elevated off of bed. MD messaged for orders for dressing. Pt with bilateral warmth and redness to calves. MD aware.  ?

## 2021-10-07 NOTE — Assessment & Plan Note (Addendum)
-   Stable, no S/S exacerbation ?- Continue spironolactone and aspirin ?

## 2021-10-07 NOTE — Assessment & Plan Note (Signed)
Continue trazodone, Pristiq, buspirone and Celexa ?

## 2021-10-07 NOTE — Assessment & Plan Note (Addendum)
Smoking cessation discussed with patient ?-Continue nicotine patch ?

## 2021-10-07 NOTE — Assessment & Plan Note (Addendum)
-   Necrotic wound on dorsal foot with purulent drainage appreciated on plantar surface.  Foul-smelling ?-Per ID, infection felt to be slowly contributed to MRSA.  Rocephin and Flagyl have been discontinued.  Remains on monotherapy vancomycin ?-Appreciate podiatry evaluation.  Concern is for healing of wound especially if further debridement.  Vascular consultation obtained and recommendation made for left BKA, patient also amenable to this.  Tentative plan for surgery 10/11/21 ?-She will likely require SNF after hospital discharge as well ?

## 2021-10-07 NOTE — ED Notes (Addendum)
Wet to dry dressing applied to both foot wounds on left foot per MD.Sterile non stick dressing used against wounds.  ?

## 2021-10-07 NOTE — ED Notes (Signed)
Fsbs 415 ?

## 2021-10-07 NOTE — Assessment & Plan Note (Addendum)
-   See cellulitis ?

## 2021-10-07 NOTE — Assessment & Plan Note (Signed)
Not acutely exacerbated ?Continue as needed bronchodilator therapy as well as inhaled steroids ?

## 2021-10-07 NOTE — Assessment & Plan Note (Addendum)
-  patient has history of CKD3a. Baseline creat ~ 1 - 1.1, eGFR 50closely ?

## 2021-10-07 NOTE — Sepsis Progress Note (Signed)
ELink tracking the Code Sepsis. 

## 2021-10-07 NOTE — ED Notes (Signed)
Pt states she uses O2 at home, pt with sats of 90-91% on room air, pt placed on O2 2 L [place].  ?

## 2021-10-07 NOTE — ED Provider Notes (Signed)
? ?[facility] ?Provider Note ? ? ? Event Date/Time  ? First MD Initiated Contact with Patient 10/07/21 1407   ?  (approximate) ? ?History  ? ?Chief Complaint: Weakness ? ?HPI ? ?[name] is a 51 y.o. female with a past medical history of CKD, anxiety, asthma, COPD, CHF, chronic wound to left foot currently being treated by [facility], presents to the emergency department for weakness and fatigue.  According to the patient over the past several days she has been feeling very weak and fatigued, has noticed increased swelling redness and pain to her left leg/foot.  Patient came to the emergency department for evaluation.  No fever. ? ?Physical Exam  ? ?Triage Vital Signs: ?ED Triage Vitals [10/07/21 1344]  ?Enc Vitals Group  ?   BP (!) 144/61  ?   Pulse Rate 99  ?   Resp 20  ?   Temp 98.4 ?F (36.9 ?C)  ?   Temp Source Oral  ?   SpO2 92 %  ?   Weight 230 lb (104.3 kg)  ?   Height '5\' 4"'$  (1.626 m)  ?   Head Circumference   ?   Peak Flow   ?   Pain Score 0  ?   Pain Loc   ?   Pain Edu?   ?   Excl. in [place]?   ? ? ?Most recent vital signs: ?Vitals:  ? 10/07/21 1344  ?BP: (!) 144/61  ?Pulse: 99  ?Resp: 20  ?Temp: 98.4 ?F (36.9 ?C)  ?SpO2: 92%  ? ? ?General: Awake, no distress.  ?CV:  Good peripheral perfusion.  Regular rate and rhythm  ?Resp:  Normal effort.  Equal breath sounds bilaterally.  ?Abd:  No distention.  Soft, nontender.  No rebound or guarding. ?Other:  Patient has moderate erythema and tenderness to the left lower extremity from the knee distally, with large appearing ulcerations 1 to the dorsal aspect the left foot and 1 to the plantar aspect of the heel consistent with chronic ulcerations.  Patient has moderate left lower extremity edema with mild right lower extremity edema ? ? ?ED Results / Procedures / Treatments  ? ?EKG ? ?EKG viewed and interpreted by myself shows sinus tachycardia at 100 bpm with a narrow QRS, left axis deviation, largely normal intervals, nonspecific but  no concerning ST changes. ? ?RADIOLOGY ? ?I have personally reviewed the x-ray images.  Appears to have a left fifth metacarpal fracture ? ? ?MEDICATIONS ORDERED IN ED: ?Medications  ?vancomycin (VANCOCIN) IVPB 1000 mg/200 mL premix (has no administration in time range)  ? ? ? ?IMPRESSION / MDM / ASSESSMENT AND PLAN / ED COURSE  ?I reviewed the triage vital signs and the nursing notes. ? ?Patient presents to the emergency department for left leg pain redness swelling.  Examination consistent with cellulitis.  Patient is mildly tachycardic.  Patient's lab work has resulted showing significant leukocytosis of 17,900, mild hyponatremia at 126.  Given the patient's elevated heart rate and leukocytosis meeting sepsis criteria we will start the patient on IV vancomycin, send blood cultures and continue to closely monitor.  Patient will require admission to the hospital service for ongoing treatment. ? ?CRITICAL CARE ?Performed by: Harvest Dark ? ? ?Total critical care time: 30 minutes ? ?Critical care time was exclusive of separately billable procedures and treating other patients. ? ?Critical care was necessary to treat or prevent imminent or life-threatening deterioration. ? ?Critical care was time spent personally by  me on the following activities: development of treatment plan with patient and/or surrogate as well as nursing, discussions with consultants, evaluation of patient's response to treatment, examination of patient, obtaining history from patient or surrogate, ordering and performing treatments and interventions, ordering and review of laboratory studies, ordering and review of radiographic studies, pulse oximetry and re-evaluation of patient's condition. ? ? ?FINAL CLINICAL IMPRESSION(S) / ED DIAGNOSES  ? ?Sepsis ?Cellulitis of the left lower extremity ? ?Note:  This document was prepared using Dragon voice recognition software and may include unintentional dictation errors. ?  ?Harvest Dark,  MD ?10/07/21 1505 ? ?

## 2021-10-07 NOTE — ED Triage Notes (Signed)
Pt from home, has ulcers on left heel and foot per pt. Left foot in boot. Pt states her urine started smelling bad this am and she felt weak so she called EMS. Pt states she has frequent UTIs. Pt alert and oriented x 4.  ?

## 2021-10-07 NOTE — Assessment & Plan Note (Addendum)
-   A1c 10.1% ?- Patient had difficulty with her regimen last hospitalization and questionable compliance ?- Continue on SSI and Semglee.  Will adjust as necessary ?- Continue CBG monitoring ?

## 2021-10-07 NOTE — H&P (Signed)
?History and Physical  ? ? ?[name] FUX:[id number] DOB: [birth year] ?DOA: 10/07/2021 ?DOS: the patient was seen and examined on 10/07/2021 ?PCP: Pleas Koch, NP  ?Patient coming from: Home ? ?Chief Complaint:  ?Chief Complaint  ?Patient presents with  ? Weakness  ? ?HPI: [name] is a 51 y.o. female with medical history significant for peripheral vascular disease, hypertension, dyslipidemia, type 2 diabetes mellitus with complications of stage IIIa chronic kidney disease, COPD with chronic respiratory failure, history of CVA, depression/anxiety, nicotine dependence, right breast cancer status postmastectomy who was recently discharged from the hospital after treatment for left diabetic foot infection/cellulitis.  She received IV vancomycin/Rocephin/Flagyl during the hospitalization and was discharged home on clindamycin. ?Per chart review even though patient is noted to have severe allergies to penicillin she has tolerated cephalosporins in the past. ?She presents to the ER for evaluation of fever as well as increased redness and pain involving her left foot.  She has a chronic left foot wound which she states her son has been dressing as recommended.  She also complains of foul-smelling urine and weakness.  She has had poor oral intake but denies having any nausea or vomiting. ?She denies having any abdominal pain, no changes in her bowel habits, no headache, no dizziness, no lightheadedness, no focal deficits of blurred vision ? ?Review of Systems: As mentioned in the history of present illness. All other systems reviewed and are negative. ?Past Medical History:  ?Diagnosis Date  ? Anxiety   ? takes Prozac daily  ? Anxiety   ? Aortic valve calcification   ? Asthma   ? Advair and Spirva daily  ? Asthma   ? Bipolar disorder ([place])   ? Breast cancer, right breast ([place]) 12/23/2019  ? CAD (coronary artery disease)   ? a. LHC 11/2013 done for CP/fluid retention: mild disease in prox LAD, mild-mod  disease in mRCA, EF 60% with normal LVEDP. b. Normal nuc 03/2016.  ? Cancer [facility])   ? Cellulitis and abscess of trunk 03/12/2020  ? CHF (congestive heart failure) ([place])   ? Chronic diastolic CHF (congestive heart failure) ([place])   ? Chronic heart failure with preserved ejection fraction ([place]) 11/16/2018  ? CKD (chronic kidney disease), stage II   ? COPD (chronic obstructive pulmonary disease) ([place])   ? a. nocturnal O2.  ? COPD (chronic obstructive pulmonary disease) ([place])   ? Coronary artery disease   ? Decreased urine stream   ? Diabetes mellitus   ? Diabetes mellitus without complication ([place])   ? Dyspnea   ? Elevated C-reactive protein (CRP) 01/29/2018  ? Elevated sedimentation rate 01/29/2018  ? Elevated serum hCG 01/19/2018  ? Family history of adverse reaction to anesthesia   ? mom gets nauseated  ? Foot pain, bilateral 05/22/2018  ? GERD (gastroesophageal reflux disease)   ? takes Pepcid daily  ? History of blood clots   ? left leg 3-60yr ago  ? Hyperlipidemia   ? Hypertension   ? Hypertriglyceridemia   ? Inguinal hernia, left 01/2015  ? Muscle spasm   ? Open wound of genital labia   ? Peripheral neuropathy   ? RBBB   ? [place]iz[place]   ? [place] 01/19/2018  ? Sinus tachycardia   ? a. persistent since 2009.  ? Sm[place]' cough ([place]   ? Status post mastectomy, right 01/14/2020  ? Stroke ([facility] 1989  ? left sided weakness  ? TIA (transient ischemic attack)   ? Tobacco abuse   ?  Vulvar abscess 01/23/2018  ? ?Past Surgical History:  ?Procedure Laterality Date  ? APPLICATION OF WOUND VAC Right 01/29/2020  ? Procedure: APPLICATION OF WOUND VAC;  Surgeon: Ronny Bacon, MD;  Location: [facility] ORS;  Service: General;  Laterality: Right;  ? BREAST BIOPSY Right 11/06/2019  ? [place] core path pending venus clip  ? GRAFT APPLICATION  2/44/0102  ? Procedure: GRAFT APPLICATION;  Surgeon: [name], DPM;  Location: [facility] OR[facility];  Service: Podiatry;;  ? HERNIA REPAIR Left   ? INCISION AND DRAINAGE Bilateral 09/21/2021  ? Procedure:  INCISION AND DRAINAGE;  Surgeon: [name], DPM;  Location: [facility];  Service: Podiatry;  Laterality: Bilateral;  ? INCISION AND DRAINAGE ABSCESS N/A 01/26/2018  ? Procedure: INCISION AND DEBRIDEMENT OF VULVAR NECROTIZING SOFT TISSUE INFECTION;  Surgeon: Greer Pickerel, MD;  Location: [place];  Service: General;  Laterality: N/A;  ? INCISION AND DRAINAGE ABSCESS N/A 07/21/2021  ? Procedure: INCISION AND DRAINAGE ABSCESS Scalp;  Surgeon: Robert Bellow, MD;  Location: [facility];  Service: General;  Laterality: N/A;  scalp and right abdomen  ? INCISION AND DRAINAGE PERIRECTAL ABSCESS N/A 01/22/2018  ? Procedure: IRRIGATION AND DEBRIDEMENT LABIAL/VULVAR AREA;  Surgeon: Coralie Keens, MD;  Location: [place];  Service: General;  Laterality: N/A;  ? [place] N/A 01/29/2018  ? Procedure: IRRIGATION AND DEBRIDEMENT VULVA;  Surgeon: Excell Seltzer, MD;  Location: [place];  Service: General;  Laterality: N/A;  ? INGUINAL HERNIA REPAIR Left 04/08/2015  ? Procedure: OPEN LEFT INGUINAL HERNIA REPAIR WITH MESH;  Surgeon: Ralene Ok, MD;  Location: [place];  Service: General;  Laterality: Left;  ? INSERTION OF MESH Left 04/08/2015  ? Procedure: INSERTION OF MESH;  Surgeon: Ralene Ok, MD;  Location: [place];  Service: General;  Laterality: Left;  ? IRRIGATION AND DEBRIDEMENT ABSCESS N/A 07/21/2021  ? Procedure: IRRIGATION AND DEBRIDEMENT ABSCESS abdomen;  Surgeon: Robert Bellow, MD;  Location: [facility];  Service: General;  Laterality: N/A;  ? LAPAROSCOPY    ? Endometriosis  ? LEFT HEART CATHETERIZATION WITH CORONARY ANGIOGRAM N/A 12/05/2013  ? Procedure: LEFT HEART CATHETERIZATION WITH CORONARY ANGIOGRAM;  Surgeon: Jettie Booze, MD;  Location[facility];  Service: Cardiovascular;  Laterality: N/A;  ? LOWER EXTREMITY ANGIOGRAPHY Left 09/22/2021  ? Procedure: Lower Extremity Angiography;  Surgeon: Katha Cabal, MD;  Location: [place] CV LAB;  Service: Cardiovascular;   Laterality: Left;  ? MASTECTOMY Right   ? right kidney drained    ? SIMPLE MASTECTOMY WITH AXILLARY SENTINEL NODE BIOPSY Right 12/23/2019  ? Procedure: Right SIMPLE MASTECTOMY WITH AXILLARY SENTINEL NODE BIOPSY;  Surgeon: Ronny Bacon, MD;  Location: [facility];  Service: General;  Laterality: Right;  ? TEE WITHOUT CARDIOVERSION N/A 11/28/2013  ? Procedure: TRANSESOPHAGEAL ECHOCARDIOGRAM (TEE);  Surgeon: Thayer Headings, MD;  Location: [place];  Service: Cardiovascular;  Laterality: N/A;  ? WOUND DEBRIDEMENT Right 01/29/2020  ? Procedure: DEBRIDEMENT WOUND, Excisional debridement skin, subcutaneous and muscle right chest wall;  Surgeon: Ronny Bacon, MD;  Location: [facility];  Service: General;  Laterality: Right;  ? ?Social History:  reports that she has been smoking cigarettes. She started smoking about 40 years ago. She has a 9.00 pack-year smoking history. She has never used smokeless tobacco. She reports that she does not drink alcohol and does not use drugs. ? ?Allergies  ?Allergen Reactions  ? Adhesive [Tape] Rash and Other (See Comments)  ?  TAKES OFF THE SKIN (CERTAIN MEDICAL  TAPES DO THIS!!)  ? Metoprolol Shortness Of Breath  ?  Occurrence of shortness of breath after 3 days  ? Montelukast Shortness Of Breath  ? Morphine Sulfate Anaphylaxis, Shortness Of Breath and Nausea And Vomiting  ?  Swollen Throat - Able to tolerate dilaudid  ? Penicillins Anaphylaxis, Hives and Shortness Of Breath  ?  Throat swells ?Has patient had a PCN reaction causing immediate rash, facial/tongue/throat swelling, SOB or lightheadedness with hypotension: Yes ?Has patient had a PCN reaction causing severe rash involving mucus membranes or skin necrosis: No ?Has patient had a PCN reaction that required hospitalization: Yes ?Has patient had a PCN reaction occurring within the last 10 years: No ?If all of the above answers are "NO", then may proceed with Cephalosporin use. ?  ? Silicone Rash  ?  Takes off the skin (certain  medical tapes do this)  ? Diltiazem Swelling  ? Doxycycline Hives  ? Gabapentin Swelling  ? Midodrine   ?  Lightheaded and falling down  ? Percocet [Oxycodone-Acetaminophen] Rash  ? ? ?Family History  ?Problem Rela

## 2021-10-07 NOTE — ED Notes (Addendum)
Unable to obtain 2nd IV access, after IV insertion attempt x 2. Unable to obtain 2nd set of blood cultures, MD notified, ok to start antibiotics.  ?

## 2021-10-07 NOTE — Assessment & Plan Note (Addendum)
Continue aspirin and Crestor 

## 2021-10-07 NOTE — Assessment & Plan Note (Signed)
Complicates overall prognosis and care ?Lifestyle modification and exercise has been discussed with patient in detail ?

## 2021-10-08 ENCOUNTER — Encounter: Payer: Self-pay | Admitting: Internal Medicine

## 2021-10-08 ENCOUNTER — Encounter: Payer: Medicare Other | Admitting: Podiatry

## 2021-10-08 DIAGNOSIS — F319 Bipolar disorder, unspecified: Secondary | ICD-10-CM | POA: Diagnosis present

## 2021-10-08 DIAGNOSIS — E871 Hypo-osmolality and hyponatremia: Secondary | ICD-10-CM | POA: Diagnosis present

## 2021-10-08 DIAGNOSIS — A4902 Methicillin resistant Staphylococcus aureus infection, unspecified site: Secondary | ICD-10-CM

## 2021-10-08 DIAGNOSIS — E1122 Type 2 diabetes mellitus with diabetic chronic kidney disease: Secondary | ICD-10-CM | POA: Diagnosis present

## 2021-10-08 DIAGNOSIS — F17210 Nicotine dependence, cigarettes, uncomplicated: Secondary | ICD-10-CM | POA: Diagnosis present

## 2021-10-08 DIAGNOSIS — E1169 Type 2 diabetes mellitus with other specified complication: Secondary | ICD-10-CM | POA: Diagnosis present

## 2021-10-08 DIAGNOSIS — J449 Chronic obstructive pulmonary disease, unspecified: Secondary | ICD-10-CM | POA: Diagnosis present

## 2021-10-08 DIAGNOSIS — T7840XA Allergy, unspecified, initial encounter: Secondary | ICD-10-CM

## 2021-10-08 DIAGNOSIS — M8618 Other acute osteomyelitis, other site: Secondary | ICD-10-CM | POA: Diagnosis present

## 2021-10-08 DIAGNOSIS — I452 Bifascicular block: Secondary | ICD-10-CM | POA: Diagnosis present

## 2021-10-08 DIAGNOSIS — D631 Anemia in chronic kidney disease: Secondary | ICD-10-CM | POA: Diagnosis present

## 2021-10-08 DIAGNOSIS — I130 Hypertensive heart and chronic kidney disease with heart failure and stage 1 through stage 4 chronic kidney disease, or unspecified chronic kidney disease: Secondary | ICD-10-CM | POA: Diagnosis present

## 2021-10-08 DIAGNOSIS — E1142 Type 2 diabetes mellitus with diabetic polyneuropathy: Secondary | ICD-10-CM | POA: Diagnosis present

## 2021-10-08 DIAGNOSIS — I70262 Atherosclerosis of native arteries of extremities with gangrene, left leg: Secondary | ICD-10-CM | POA: Diagnosis present

## 2021-10-08 DIAGNOSIS — M86172 Other acute osteomyelitis, left ankle and foot: Secondary | ICD-10-CM | POA: Diagnosis present

## 2021-10-08 DIAGNOSIS — R7881 Bacteremia: Secondary | ICD-10-CM | POA: Diagnosis not present

## 2021-10-08 DIAGNOSIS — T07XXXA Unspecified multiple injuries, initial encounter: Secondary | ICD-10-CM | POA: Diagnosis not present

## 2021-10-08 DIAGNOSIS — Z853 Personal history of malignant neoplasm of breast: Secondary | ICD-10-CM | POA: Diagnosis not present

## 2021-10-08 DIAGNOSIS — Z794 Long term (current) use of insulin: Secondary | ICD-10-CM | POA: Diagnosis not present

## 2021-10-08 DIAGNOSIS — L97423 Non-pressure chronic ulcer of left heel and midfoot with necrosis of muscle: Secondary | ICD-10-CM | POA: Diagnosis present

## 2021-10-08 DIAGNOSIS — B9562 Methicillin resistant Staphylococcus aureus infection as the cause of diseases classified elsewhere: Secondary | ICD-10-CM

## 2021-10-08 DIAGNOSIS — E11628 Type 2 diabetes mellitus with other skin complications: Secondary | ICD-10-CM | POA: Diagnosis present

## 2021-10-08 DIAGNOSIS — E1152 Type 2 diabetes mellitus with diabetic peripheral angiopathy with gangrene: Secondary | ICD-10-CM | POA: Diagnosis present

## 2021-10-08 DIAGNOSIS — L97523 Non-pressure chronic ulcer of other part of left foot with necrosis of muscle: Secondary | ICD-10-CM | POA: Diagnosis not present

## 2021-10-08 DIAGNOSIS — I5032 Chronic diastolic (congestive) heart failure: Secondary | ICD-10-CM | POA: Diagnosis present

## 2021-10-08 DIAGNOSIS — J9610 Chronic respiratory failure, unspecified whether with hypoxia or hypercapnia: Secondary | ICD-10-CM | POA: Diagnosis present

## 2021-10-08 DIAGNOSIS — E1165 Type 2 diabetes mellitus with hyperglycemia: Secondary | ICD-10-CM | POA: Diagnosis present

## 2021-10-08 DIAGNOSIS — N1831 Chronic kidney disease, stage 3a: Secondary | ICD-10-CM | POA: Diagnosis present

## 2021-10-08 DIAGNOSIS — C50911 Malignant neoplasm of unspecified site of right female breast: Secondary | ICD-10-CM | POA: Diagnosis not present

## 2021-10-08 DIAGNOSIS — L0390 Cellulitis, unspecified: Secondary | ICD-10-CM | POA: Diagnosis present

## 2021-10-08 DIAGNOSIS — I96 Gangrene, not elsewhere classified: Secondary | ICD-10-CM | POA: Diagnosis not present

## 2021-10-08 DIAGNOSIS — Z170 Estrogen receptor positive status [ER+]: Secondary | ICD-10-CM | POA: Diagnosis not present

## 2021-10-08 DIAGNOSIS — A4102 Sepsis due to Methicillin resistant Staphylococcus aureus: Secondary | ICD-10-CM | POA: Diagnosis present

## 2021-10-08 DIAGNOSIS — L03116 Cellulitis of left lower limb: Secondary | ICD-10-CM | POA: Diagnosis present

## 2021-10-08 HISTORY — DX: Allergy, unspecified, initial encounter: T78.40XA

## 2021-10-08 HISTORY — DX: Methicillin resistant Staphylococcus aureus infection, unspecified site: A49.02

## 2021-10-08 LAB — BLOOD CULTURE ID PANEL (REFLEXED) - BCID2

## 2021-10-08 LAB — URINE CULTURE

## 2021-10-08 LAB — GLUCOSE, CAPILLARY
Glucose-Capillary: 192 mg/dL — ABNORMAL HIGH (ref 70–99)
Glucose-Capillary: 252 mg/dL — ABNORMAL HIGH (ref 70–99)
Glucose-Capillary: 294 mg/dL — ABNORMAL HIGH (ref 70–99)
Glucose-Capillary: 339 mg/dL — ABNORMAL HIGH (ref 70–99)

## 2021-10-08 LAB — BASIC METABOLIC PANEL
Anion gap: 9 (ref 5–15)
BUN: 27 mg/dL — ABNORMAL HIGH (ref 6–20)
CO2: 26 mmol/L (ref 22–32)
Calcium: 8.3 mg/dL — ABNORMAL LOW (ref 8.9–10.3)
Chloride: 102 mmol/L (ref 98–111)
Creatinine, Ser: 1.04 mg/dL — ABNORMAL HIGH (ref 0.44–1.00)
GFR, Estimated: >60 mL/min (ref >60)
Glucose, Bld: 211 mg/dL — ABNORMAL HIGH (ref 70–99)
Potassium: 3.7 mmol/L (ref 3.5–5.1)
Sodium: 137 mmol/L (ref 135–145)

## 2021-10-08 LAB — CBC
HCT: 29.5 % — ABNORMAL LOW (ref 36.0–46.0)
Hemoglobin: 9.4 g/dL — ABNORMAL LOW (ref 12.0–15.0)
MCH: 26.5 pg (ref 26.0–34.0)
MCHC: 31.9 g/dL (ref 30.0–36.0)
MCV: 83.1 fL (ref 80.0–100.0)
Platelets: 266 10*3/uL (ref 150–400)
RBC: 3.55 MIL/uL — ABNORMAL LOW (ref 3.87–5.11)
RDW: 15.3 % (ref 11.5–15.5)
WBC: 16.9 10*3/uL — ABNORMAL HIGH (ref 4.0–10.5)
nRBC: 0 % (ref 0.0–0.2)

## 2021-10-08 MED ORDER — FAMOTIDINE IN NACL 20-0.9 MG/50ML-% IV SOLN
20.0000 mg | Freq: Once | INTRAVENOUS | Status: AC
  Administered 2021-10-08: 20 mg via INTRAVENOUS
  Filled 2021-10-08: qty 50

## 2021-10-08 MED ORDER — VANCOMYCIN HCL 2000 MG/400ML IV SOLN
2000.0000 mg | Freq: Once | INTRAVENOUS | Status: AC
  Administered 2021-10-08: 2000 mg via INTRAVENOUS
  Filled 2021-10-08: qty 400

## 2021-10-08 MED ORDER — METRONIDAZOLE 500 MG/100ML IV SOLN
500.0000 mg | Freq: Two times a day (BID) | INTRAVENOUS | Status: DC
  Administered 2021-10-08: 500 mg via INTRAVENOUS
  Filled 2021-10-08 (×2): qty 100

## 2021-10-08 MED ORDER — METHYLPREDNISOLONE SODIUM SUCC 125 MG IJ SOLR
60.0000 mg | Freq: Once | INTRAMUSCULAR | Status: AC
  Administered 2021-10-08: 60 mg via INTRAVENOUS
  Filled 2021-10-08: qty 2

## 2021-10-08 MED ORDER — DIPHENHYDRAMINE HCL 50 MG/ML IJ SOLN
12.5000 mg | Freq: Four times a day (QID) | INTRAMUSCULAR | Status: AC
  Administered 2021-10-08 – 2021-10-09 (×4): 12.5 mg via INTRAVENOUS
  Filled 2021-10-08 (×4): qty 1

## 2021-10-08 MED ORDER — VANCOMYCIN HCL 750 MG/150ML IV SOLN
750.0000 mg | Freq: Two times a day (BID) | INTRAVENOUS | Status: DC
  Administered 2021-10-08 – 2021-10-10 (×4): 750 mg via INTRAVENOUS
  Filled 2021-10-08 (×5): qty 150

## 2021-10-08 NOTE — Assessment & Plan Note (Signed)
-   Continue anastrozole ?- s/p right mastectomy  ?

## 2021-10-08 NOTE — TOC CM/SW Note (Signed)
Patient was set up with [facility] for home health services last admission. Per [facility] representative, they left several voicemails with no return call so they were unable to start services. ? ?[name], [place] ?[phone] ? ?

## 2021-10-08 NOTE — Progress Notes (Signed)
PHARMACY - PHYSICIAN COMMUNICATION ?CRITICAL VALUE ALERT - BLOOD CULTURE IDENTIFICATION (BCID) ? ?[name] is an 51 y.o. female who presented to [facility] on 10/07/2021 with a chief complaint of weakness ? ?Assessment:  Blood cultures from 3/31 with GPC in 1 of 4 bottles currently.  BCID detects MRSA.  She has chronic BL LE ulcers, but noted to have L LE.   ? ?Name of physician (or Provider) Contacted: Drs Delaine Lame and [place] ? ?Current antibiotics: ceftriaxone and metronidazole (received vanco x 1 3/30) ? ?Changes to prescribed antibiotics recommended:  ?Recommendations accepted by provider ? ?Results for orders placed or performed during the hospital encounter of 10/07/21  ?Blood Culture ID Panel (Reflexed) (Collected: 10/07/2021  3:12 PM)  ?Result Value Ref Range  ? Enterococcus faecalis NOT DETECTED NOT DETECTED  ? Enterococcus Faecium NOT DETECTED NOT DETECTED  ? Listeria monocytogenes NOT DETECTED NOT DETECTED  ? Staphylococcus species DETECTED (A) NOT DETECTED  ? Staphylococcus aureus (BCID) DETECTED (A) NOT DETECTED  ? Staphylococcus epidermidis NOT DETECTED NOT DETECTED  ? Staphylococcus lugdunensis NOT DETECTED NOT DETECTED  ? Streptococcus species NOT DETECTED NOT DETECTED  ? Streptococcus agalactiae NOT DETECTED NOT DETECTED  ? Streptococcus pneumoniae NOT DETECTED NOT DETECTED  ? Streptococcus pyogenes NOT DETECTED NOT DETECTED  ? A.calcoaceticus-baumannii NOT DETECTED NOT DETECTED  ? Bacteroides fragilis NOT DETECTED NOT DETECTED  ? Enterobacterales NOT DETECTED NOT DETECTED  ? Enterobacter cloacae complex NOT DETECTED NOT DETECTED  ? Escherichia coli NOT DETECTED NOT DETECTED  ? Klebsiella aerogenes NOT DETECTED NOT DETECTED  ? Klebsiella oxytoca NOT DETECTED NOT DETECTED  ? Klebsiella pneumoniae NOT DETECTED NOT DETECTED  ? Proteus species NOT DETECTED NOT DETECTED  ? Salmonella species NOT DETECTED NOT DETECTED  ? Serratia marcescens NOT DETECTED NOT DETECTED  ? Haemophilus influenzae NOT  DETECTED NOT DETECTED  ? Neisseria meningitidis NOT DETECTED NOT DETECTED  ? Pseudomonas aeruginosa NOT DETECTED NOT DETECTED  ? Stenotrophomonas maltophilia NOT DETECTED NOT DETECTED  ? Candida albicans NOT DETECTED NOT DETECTED  ? Candida auris NOT DETECTED NOT DETECTED  ? Candida glabrata NOT DETECTED NOT DETECTED  ? Candida krusei NOT DETECTED NOT DETECTED  ? Candida parapsilosis NOT DETECTED NOT DETECTED  ? Candida tropicalis NOT DETECTED NOT DETECTED  ? Cryptococcus neoformans/gattii NOT DETECTED NOT DETECTED  ? Meth resistant mecA/C and MREJ DETECTED (A) NOT DETECTED  ? ?Doreene Eland, PharmD, BCPS, BCIDP ?Work Cell: [phone] ?10/08/2021 8:01 AM ? ? ? ?

## 2021-10-08 NOTE — Consult Note (Signed)
? ?PODIATRY CONSULTATION ? ?NAME [name] ?MRN [id number] ?DOB 1971-06-04 ?DOA 10/07/2021  ? ?Reason for consult:  ?Chief Complaint  ?Patient presents with  ? Weakness  ? ? ?Consulting physician: Dwyane Dee MD ? ?History of present illness: 51 y.o. female PMHx PVD, DM type II; uncontrolled, CKD stage III readmitted for the hospital for worsening redness and swelling to her bilateral legs.  She was recently hospitalized 3/13-3/16 for diabetic infection.  She underwent surgical or debridement 09/21/2021 by podiatry in the [facility]. ? ?Last seen in the outpatient in our podiatry office 10/01/2021 where dressings were changed.  Patient states that a few days ago she began to develop redness, pain, swelling in her left leg.  Poor historian.  Patient admitted and started on IV antibiotics. ? ?Past Medical History:  ?Diagnosis Date  ? Anxiety   ? takes Prozac daily  ? Anxiety   ? Aortic valve calcification   ? Asthma   ? Advair and Spirva daily  ? Asthma   ? Bipolar disorder ([place])   ? Breast cancer, right breast ([place]) 12/23/2019  ? CAD (coronary artery disease)   ? a. LHC 11/2013 done for CP/fluid retention: mild disease in prox LAD, mild-mod disease in mRCA, EF 60% with normal LVEDP. b. Normal nuc 03/2016.  ? Cancer [facility])   ? Cellulitis and abscess of trunk 03/12/2020  ? CHF (congestive heart failure) ([place])   ? Chronic diastolic CHF (congestive heart failure) ([place])   ? Chronic heart failure with preserved ejection fraction ([place]) 11/16/2018  ? CKD (chronic kidney disease), stage II   ? COPD (chronic obstructive pulmonary disease) ([place])   ? a. nocturnal O2.  ? COPD (chronic obstructive pulmonary disease) ([place])   ? Coronary artery disease   ? Decreased urine stream   ? Diabetes mellitus   ? Diabetes mellitus without complication ([place])   ? Dyspnea   ? Elevated C-reactive protein (CRP) 01/29/2018  ? Elevated sedimentation rate 01/29/2018  ? Elevated serum hCG 01/19/2018  ? Family history of adverse reaction to anesthesia   ? mom gets  nauseated  ? Foot pain, bilateral 05/22/2018  ? GERD (gastroesophageal reflux disease)   ? takes Pepcid daily  ? History of blood clots   ? left leg 3-70yr ago  ? Hyperlipidemia   ? Hypertension   ? Hypertriglyceridemia   ? Inguinal hernia, left 01/2015  ? Muscle spasm   ? Open wound of genital labia   ? Peripheral neuropathy   ? RBBB   ? [place]   ? [place]is [place] 01/19/2018  ? Sinus tachycardia   ? a. persistent since 2009.  ? Smokers' cough ([place]   ? Status post mastectomy, right 01/14/2020  ? Stroke ([facility] 1989  ? left sided weakness  ? TIA (transient ischemic attack)   ? Tobacco abuse   ? Vulvar abscess 01/23/2018  ? ? ? ?  Latest Ref Rng & Units 10/08/2021  ?  4:32 AM 10/07/2021  ?  1:47 PM 09/23/2021  ?  6:20 AM  ?CBC  ?WBC 4.0 - 10.5 K/uL 16.9   17.9   8.3    ?Hemoglobin 12.0 - 15.0 g/dL 9.4   10.1   10.5    ?Hematocrit 36.0 - 46.0 % 29.5   32.6   34.7    ?Platelets 150 - 400 K/uL 266   261   173    ? ? ? ?  Latest Ref Rng & Units 10/08/2021  ?  4:32 AM 10/07/2021  ?  1:47 PM 09/23/2021  ?  6:20 AM  ?BMP  ?Glucose 70 - 99 mg/dL 211   428   239    ?BUN 6 - 20 mg/dL 27   29   35    ?Creatinine 0.44 - 1.00 mg/dL 1.04   1.23   1.04    ?Sodium 135 - 145 mmol/L 137   126   135    ?Potassium 3.5 - 5.1 mmol/L 3.7   4.4   4.5    ?Chloride 98 - 111 mmol/L 102   91   104    ?CO2 22 - 32 mmol/L '26   25   25    '$ ?Calcium 8.9 - 10.3 mg/dL 8.3   8.3   8.7    ? ? ? ? ? ? ? ? ? ?  ?Physical Exam: ?General: The patient is alert and oriented x3 in no acute distress.  ? ?Dermatology: Multiple ulcers noted bilateral lower extremities ?Ulcer RT anterior leg; stable ? ?Ulcers LT dorsal foot and plantar heel have a heavy overlying well adhered eschar.  New finding of skin breakdown to the left forefoot plantar aspect.  Drainage noted.  There is some moderate malodor to the left foot. ? ?Vascular: Lower Extremity Angiography 09/22/2021  ?Findings:  ?Aortogram: Abdominal aorta is opacified with bolus injection contrast. There are no  significant atherosclerotic changes noted.  The aorta bilateral common internal and external iliac arteries are all widely patent ?Right Lower Extremity: The right common femoral origins of the profunda femoris and superficial femoral as well as the visualized portions of the superficial femoral and profunda femoris are widely patent there is no significant atherosclerotic changes noted. ?Left Lower Extremity: The left common femoral profundofemoral superficial femoral and popliteal arteries are widely patent.  There are no significant atherosclerotic changes noted.  There is no calcifications appreciated.  There is a smooth tapering in the mid popliteal that measures 30% and is not hemodynamically significant.  The trifurcation is widely patent the origin of the anterior tibial demonstrates a short segment 20 to 25% narrowing but this does not appear to be hemodynamically significant.  Tibioperoneal trunk peroneal and posterior tibial are all widely patent.  At the level of the foot the posterior tibial is the dominant contributor filling the plantar arteries with hyperemic response in the heel area.  This is the direct flow to the heel ulcer.  The anterior tibial does fill the dorsalis pedis although this is not quite as brisk as the posterior tibial it is widely patent.  The peroneal does coursed down to the ankle but does not appear to give large collaterals to either the dorsalis pedis or plantar artery distribution. ?Summary: ?Patient's arterial flow is widely patent.  There were just a few nonflow limiting lesions noted with the dominant tibial runoff via the posterior tibial which is the optimal flow pattern for healing the heel ulcer. ? ?Neurological: Epicritic and protective threshold diminished bilaterally.  ? ?Musculoskeletal Exam: No prior amputation.  No structural deformity ? ? ? ?ASSESSMENT/PLAN OF CARE ?Cellulitis/worsening infection bilateral LE ?2.   Diabetic foot ulcers left ?-The left heel and  dorsal foot were surgically debrided 2 weeks ago and subsequently developed necrotic eschar to the wound sites despite wound graft product and dressing changes.  Suspect microvascular small vessel disease. ?-ID consulted.  Continue IV ABX as per ID ?-Patient may require additional return to the OR for excisional debridement however I would like to wait 24-48hrs to see if the  antibiotics resolve any of the cellulitis or infection.  ?-Concern for additional debridement is the small vessel disease which may recreate necrotic eschar tissue even after additional debridement ?-Podiatry will closely monitor and plan to see tomorrow, 10/09/2021, to decide whether or not to proceed with surgical debridement again ?-In the meantime, Betadine wet-to-dry dressings ? ?  ?Thank you for the consult.  Please contact me directly with any questions or concerns.  Cell [phone] ?  ?[name], DPM ?[place] ? ?Dr. [name], DPM  ?  ?[address]                                        ?[place], [place] [address]                ?Office [phone]  ?Fax [phone] ? ? ? ? ?

## 2021-10-08 NOTE — Assessment & Plan Note (Addendum)
-   underwent angiogram of abdomen and left lower extremity on 09/22/2021 with no significant stenosis nor stenting required ?-Continue aspirin and Crestor ?-See cellulitis above.  Tentative plan is now for left BKA due to concern for healing of foot wound on 10/11/21 ?

## 2021-10-08 NOTE — Progress Notes (Addendum)
Pharmacy Antibiotic Note ? ?[name] is a 51 y.o. female admitted on 10/07/2021 with bacteremia.  Pharmacy has been consulted for vancomycin dosing. Patient has chronic lower extremity wounds w/ cellulitis noted on L leg. She is s/p I&D of BL ulcers on 3/14 (no cultures taken).  She was discharged on clindamycin. Abscess culture from January grew MRSA, appears has I&D of scalp and abdominal wall abscesses at that time.   ? ?Today, 10/08/2021 ?Day 1 antibiotics - currently ceftriaxone and metronidazole (received vancomycin x 1 dose 3/30 in ED) ?Renal - SCr 1.04 ?WBC = 17.9 at admit to 16.9 this am ?Blood culture with GPC with BCID detecting MRSA ?Automatic ID consult ? ?Plan: ?Vancomycin 2gm IV x 1 then '750mg'$  IV q12h ?Estimated AUC 434.5 using SCr 1.04, Vd = 0.5 L/kg and Adj BW to calculate CrCl/ke ?Goal AUC 400-600 ?Monitor renal function with plans to check vancomycin levels at steady state ?Follow-up blood cultures, repeat blood cultures, TTE and possibily TEE if deemed indicated ?ID to see ? ? ?Height: '5\' 4"'$  (162.6 cm) ?Weight: 104.3 kg (230 lb) ?IBW/kg (Calculated) : 54.7 ? ?Temp (24hrs), Avg:98.7 ?F (37.1 ?C), Min:98.2 ?F (36.8 ?C), Max:100 ?F (37.8 ?C) ? ?Recent Labs  ?Lab 10/07/21 ?1347 10/07/21 ?1511 10/08/21 ?0432  ?WBC 17.9*  --  16.9*  ?CREATININE 1.23*  --  1.04*  ?LATICACIDVEN  --  1.8  --   ?  ?Estimated Creatinine Clearance: 74.4 mL/min (A) (by C-G formula based on SCr of 1.04 mg/dL (H)).   ? ?Allergies  ?Allergen Reactions  ? Adhesive [Tape] Rash and Other (See Comments)  ?  TAKES OFF THE SKIN (CERTAIN MEDICAL TAPES DO THIS!!)  ? Metoprolol Shortness Of Breath  ?  Occurrence of shortness of breath after 3 days  ? Montelukast Shortness Of Breath  ? Morphine Sulfate Anaphylaxis, Shortness Of Breath and Nausea And Vomiting  ?  Swollen Throat - Able to tolerate dilaudid  ? Penicillins Anaphylaxis, Hives and Shortness Of Breath  ?  Throat swells ?Has patient had a PCN reaction causing immediate rash,  facial/tongue/throat swelling, SOB or lightheadedness with hypotension: Yes ?Has patient had a PCN reaction causing severe rash involving mucus membranes or skin necrosis: No ?Has patient had a PCN reaction that required hospitalization: Yes ?Has patient had a PCN reaction occurring within the last 10 years: No ?If all of the above answers are "NO", then may proceed with Cephalosporin use. ?  ? Silicone Rash  ?  Takes off the skin (certain medical tapes do this)  ? Diltiazem Swelling  ? Doxycycline Hives  ? Gabapentin Swelling  ? Midodrine   ?  Lightheaded and falling down  ? Percocet [Oxycodone-Acetaminophen] Rash  ? ? ?Antimicrobials this admission: ?Ceftriaxone 3/30 >> ?Metronidazole 3/31 >> ?Vancomycin 3/30 >> ? ?Dose adjustments this admission: ? ? ?Microbiology results: ?3/30 BCx: GPC in 1 of 4 bottles, BCID = MRSA ?3/30 UCx: IP  ? ?Thank you for allowing pharmacy to be a part of this patient?s care. ? ?Doreene Eland, PharmD, BCPS, BCIDP ?Work Cell: [phone] ?10/08/2021 8:37 AM ? ? ? ?

## 2021-10-08 NOTE — Progress Notes (Addendum)
?Progress Note ? ? ? ?[name]   ?WFU:[id number]  ?DOB: [birth year]  ?DOA: 10/07/2021     0 ?PCP: Pleas Koch, NP ? ?Initial CC: left foot wound ? ?Hospital Course: ?[name] is a 51 yo female with PMH PVD, HTN, HLD, DMII, COPD, asthma, CVA, GERD, depression/anxiety, tobacco abuse, seizure d/o, anemia, CAD, CKD3a, dCHF, right breast cancer (s/p mastectomy) who presented with worsening pain and swelling in her left foot and a nonhealing wound. ? ?She was recently hospitalized from 09/20/2021 until 09/23/2021 for a diabetic infection involving her left foot and she had undergone debridement on 09/21/2021 with podiatry in the OR.  She also had vascular surgery evaluation with abdominal aortogram and left lower extremity angiogram (no significant stenosis/no intervention required).  ? ?She had been healing fairly well at home until a few days prior to admission when she developed redness, pain, swelling of her left leg.  She also has had an ongoing diffuse rash presumed a drug rash possibly from clindamycin at discharge.  She remains a poor historian and was unable to elaborate on her medications once again and whether or not the rash preceded starting clindamycin. ?She was started on antibiotics and podiatry was also consulted. ? ?Interval History:  ?Seen in her room this morning.  Still remains a poor historian and was unable to elaborate much on her skin rash.  Leading theory is possible reaction to clindamycin.  She has multiple drug allergies unfortunately.  ?She understands plan is for supportive wound care with dressing changes and antibiotics for now. ? ?Assessment and Plan: ?* Cellulitis of left lower extremity ?- Necrotic wound on dorsal foot with purulent drainage appreciated on plantar surface.  Foul-smelling ?-Broaden antibiotics to vancomycin, Rocephin, Flagyl ?-Continue following cultures (see bacteremia) ?-Appreciate podiatry evaluation, monitoring for now on antibiotics; follow wound care rec's  per podiatry too ? ?Bacteremia ?- Blood cultures from admission positive for 1/4 bottles of GPC which speciated to MRSA.  Etiology would be considered contamination versus infectious from foot wound (hygeine is questionable and true bacteremia is a practical consideration) ?-Vancomycin added this morning after culture resulted ?-ID is auto consulted, will follow up recommendations ?- Hold off on echo at this time ?- repeat cultures on 10/09/21 ? ?Uncontrolled type 2 diabetes mellitus with hyperglycemia, with long-term current use of insulin (HCC) ?- A1c 10.1% ?- Patient had difficulty with her regimen last hospitalization and questionable compliance ?- Continue on SSI and Semglee.  Will adjust as necessary ?- Continue CBG monitoring ? ?Allergic reaction ?- Drug rash appreciated on abdomen, lower chest, and thighs.  Patient has been taking Benadryl at home with minimal improvement.  She denies pruritus.  Possible reaction to previously prescribed clindamycin was about her only new medication.  She had a similar rash but smaller last hospitalization after having been on doxycycline ?-For now treating with Benadryl, Pepcid, steroid.  Rash to diffuse for cream at this time ? ?Hyponatremia ?Most likely secondary to hyperglycemia and possibly medication induced ?Hold spironolactone ? ?Chronic diastolic CHF (congestive heart failure) ([place]) ?Hold spironolactone due to hyponatremia ?Monitor respiratory status closely ? ?Chronic foot ulcer with necrosis of muscle, left ([place]) ?We will request wound care consult as well as podiatry evaluation ? ?Breast cancer ([place]) ?- Continue anastrozole ?- s/p right mastectomy  ? ?CAD (coronary artery disease) ?Continue aspirin and Crestor ? ?Chronic kidney disease, stage 3a ([place]) ?- patient has history of CKD3a. Baseline creat ~ 1 - 1.1, eGFR 50closely ? ?PAD (peripheral artery disease) (  [place]) ?- underwent angiogram of abdomen and left lower extremity on 09/22/2021 with no significant stenosis  nor stenting required ?-Continue aspirin and Crestor ? ?Bipolar disorder ([place]) ?Continue trazodone, Pristiq, buspirone and Celexa ? ?COPD with chronic bronchitis ([place]) ?Not acutely exacerbated ?Continue as needed bronchodilator therapy as well as inhaled steroids ? ?Tobacco abuse ?Smoking cessation discussed with patient ?We will place on a nicotine transdermal patch ? ?Morbid (severe) obesity due to excess calories ([place]) ?Complicates overall prognosis and care ?Lifestyle modification and exercise has been discussed with patient in detail ? ? ? ?Old records reviewed in assessment of this patient ? ?Antimicrobials: ?Rocephin 10/07/2021 >> current ?Vancomycin 10/07/2021 >> current ?Flagyl 10/08/2021 >> current ? ?DVT prophylaxis:  ?  Lovenox ? ? ?Code Status:   Code Status: Full Code ? ?Disposition Plan:  pending clinical course ([facility] vs SNF) ?Status is: Inpt ? ?Objective: ?Blood pressure 134/65, pulse 86, temperature 98.2 ?F (36.8 ?C), resp. rate 15, height 5' 4"  (1.626 m), weight 104.3 kg, SpO2 97 %.  ?Examination:  ?Physical Exam ?Constitutional:   ?   General: She is not in acute distress. ?   Appearance: Normal appearance.  ?HENT:  ?   Head: Normocephalic and atraumatic.  ?   Mouth/Throat:  ?   Mouth: Mucous membranes are moist.  ?Eyes:  ?   Extraocular Movements: Extraocular movements intact.  ?Cardiovascular:  ?   Rate and Rhythm: Normal rate and regular rhythm.  ?Pulmonary:  ?   Effort: Pulmonary effort is normal.  ?   Breath sounds: Normal breath sounds.  ?Abdominal:  ?   General: Bowel sounds are normal. There is no distension.  ?   Palpations: Abdomen is soft.  ?   Tenderness: There is no abdominal tenderness.  ?Musculoskeletal:  ?   Cervical back: Normal range of motion and neck supple.  ?   Comments: Left foot with large necrotic dry wound on dorsal surface; approx 3 cm diameter blister on plantar surface near toes draining foul smelling pus; left leg dense erythema with calor and mild dolor   ?Neurological:   ?   Mental Status: She is alert.  ?  ?Pic from 10/08/21: ? ? ? ? ?Consultants:  ?Podiatry ? ?Procedures:  ? ? ?Data Reviewed: ?Results for orders placed or performed during the hospital encounter of 10/07/21 (from the past 24 hour(s))  ?Basic metabolic panel     Status: Abnormal  ? Collection Time: 10/07/21  1:47 PM  ?Result Value Ref Range  ? Sodium 126 (L) 135 - 145 mmol/L  ? Potassium 4.4 3.5 - 5.1 mmol/L  ? Chloride 91 (L) 98 - 111 mmol/L  ? CO2 25 22 - 32 mmol/L  ? Glucose, Bld 428 (H) 70 - 99 mg/dL  ? BUN 29 (H) 6 - 20 mg/dL  ? Creatinine, Ser 1.23 (H) 0.44 - 1.00 mg/dL  ? Calcium 8.3 (L) 8.9 - 10.3 mg/dL  ? GFR, Estimated 53 (L) >60 mL/min  ? Anion gap 10 5 - 15  ?CBC     Status: Abnormal  ? Collection Time: 10/07/21  1:47 PM  ?Result Value Ref Range  ? WBC 17.9 (H) 4.0 - 10.5 K/uL  ? RBC 3.90 3.87 - 5.11 MIL/uL  ? Hemoglobin 10.1 (L) 12.0 - 15.0 g/dL  ? HCT 32.6 (L) 36.0 - 46.0 %  ? MCV 83.6 80.0 - 100.0 fL  ? MCH 25.9 (L) 26.0 - 34.0 pg  ? MCHC 31.0 30.0 - 36.0 g/dL  ? RDW 15.4  11.5 - 15.5 %  ? Platelets 261 150 - 400 K/uL  ? nRBC 0.0 0.0 - 0.2 %  ?CBG monitoring, ED     Status: Abnormal  ? Collection Time: 10/07/21  2:00 PM  ?Result Value Ref Range  ? Glucose-Capillary 415 (H) 70 - 99 mg/dL  ?Lactic acid, plasma     Status: None  ? Collection Time: 10/07/21  3:11 PM  ?Result Value Ref Range  ? Lactic Acid, Venous 1.8 0.5 - 1.9 mmol/L  ?Blood culture (routine x 2)     Status: None (Preliminary result)  ? Collection Time: 10/07/21  3:12 PM  ? Specimen: BLOOD  ?Result Value Ref Range  ? Specimen Description BLOOD LEFT ANTECUBITAL   ? Special Requests    ?  BOTTLES DRAWN AEROBIC AND ANAEROBIC Blood Culture adequate volume  ? Culture  Setup Time    ?  Organism ID to follow ?GRAM POSITIVE COCCI ?AEROBIC BOTTLE ONLY ?CRITICAL RESULT CALLED TO, READ BACK BY AND VERIFIED WITH: MORGAN HICKS AT 5809 10/08/21.PMF ?Performed at [facility], [address] [place], [place] [address] ?  ? Culture GRAM POSITIVE  COCCI   ? Report Status PENDING   ?Urinalysis, Complete w Microscopic Urine, Clean Catch     Status: Abnormal  ? Collection Time: 10/07/21  3:12 PM  ?Result Value Ref Range  ? Color, Urine YELLOW (A) Y

## 2021-10-08 NOTE — Assessment & Plan Note (Addendum)
-   Drug rash appreciated on abdomen, lower chest, and thighs.  Patient has been taking Benadryl at home with minimal improvement.  She denies pruritus.  Possible reaction to previously prescribed clindamycin was about her only new medication.  She had a similar rash but smaller last hospitalization after having been on doxycycline ?- s/p Benadryl, Pepcid, steroid ?

## 2021-10-08 NOTE — Progress Notes (Signed)
Inpatient Diabetes Program Recommendations ? ?AACE/ADA: New Consensus Statement on Inpatient Glycemic Control (2015) ? ?Target Ranges:  Prepandial:   less than 140 mg/dL ?     Peak postprandial:   less than 180 mg/dL (1-2 hours) ?     Critically ill patients:  140 - 180 mg/dL  ? ? Latest Reference Range & Units 10/07/21 14:00 10/07/21 17:06 10/07/21 21:10 10/08/21 08:48 10/08/21 11:39  ?Glucose-Capillary 70 - 99 mg/dL 415 (H) 317 (H) ? ?11 units Novolog ? 271 (H) ? ? ? ?25 units Semglee '@2224'$  192 (H) ? ?3 units Novolog ? 252 (H) ? ? ? ?60 mg Solumedrol  ? ? ? ?Home DM Meds: Lantus 25 units Daily ?  Novolog 1-9 units QID per SSI ?  Synjardy 25/1000 mg daily ? ? ?Current Orders: Semglee 25 units QHS ?     Novolog 0-15 units TID AC ?  ? ? ? ?MD- Please consider starting Novolog Meal Coverage: ? ?Novolog 4 units TID with meals ?HOLD if pt eats <50% meals ? ?If AM CBG elevated tomorrow AM, please also consider increasing the Semglee slightly to 28 units QHS ? ? ? ?--Will follow patient during hospitalization-- ? ?Wyn Quaker RN, MSN, CDE ?Diabetes Coordinator ?Inpatient Glycemic Control Team ?Team Pager: [phone] (8a-5p) ? ? ?

## 2021-10-08 NOTE — Consult Note (Signed)
NAME: [name]  ?DOB: [birth year]  ?MRN: [id number]  ?Date/Time: 10/08/2021 11:21 AM ? ?REQUESTING PROVIDER: Dr.Girguis ?Subjective:  ?REASON FOR CONSULT: MRSA bacteremia ??pt is a limited historian, chart reviewed, spoke to partner ?[name] is a 51 y.o. with a history of DM. RT ca breast s/p mastectomy on anastrozole, HTN, nicotine dependence smoker, COPD, h/o CVA, h/o MRSA skin abscesses recently in hospital between 3/13-3/16 for multiple wounds legs which were debrided by podiatrist and she was placed on clinda on discharge, presents by EMS with weakness of few days . She was also c/o dark urine and also worsening wounds on her feet. Pt has poor mobility  ?In the Ed vitals 160/78, temp 98.4, HR 95, sats 95% ?WBC 17.9, HB 10.1, PLT 261, cr 1.23 ?Blood culture sent and she was started on vanco/ceftriaxone and flagyl. ?As blood culture positive for MRSA I am seeing this patient ? ?Past Medical History:  ?Diagnosis Date  ? Anxiety   ? takes Prozac daily  ? Anxiety   ? Aortic valve calcification   ? Asthma   ? Advair and Spirva daily  ? Asthma   ? Bipolar disorder ([place])   ? Breast cancer, right breast ([place]) 12/23/2019  ? CAD (coronary artery disease)   ? a. LHC 11/2013 done for CP/fluid retention: mild disease in prox LAD, mild-mod disease in mRCA, EF 60% with normal LVEDP. b. Normal nuc 03/2016.  ? Cancer [facility])   ? Cellulitis and abscess of trunk 03/12/2020  ? CHF (congestive heart failure) ([place])   ? Chronic diastolic CHF (congestive heart failure) ([place])   ? Chronic heart failure with preserved ejection fraction ([place]) 11/16/2018  ? CKD (chronic kidney disease), stage II   ? COPD (chronic obstructive pulmonary disease) ([place])   ? a. nocturnal O2.  ? COPD (chronic obstructive pulmonary disease) ([place])   ? Coronary artery disease   ? Decreased urine stream   ? Diabetes mellitus   ? Diabetes mellitus without complication ([place])   ? Dyspnea   ? Elevated C-reactive protein (CRP) 01/29/2018  ? Elevated sedimentation rate  01/29/2018  ? Elevated serum hCG 01/19/2018  ? Family history of adverse reaction to anesthesia   ? mom gets nauseated  ? Foot pain, bilateral 05/22/2018  ? GERD (gastroesophageal reflux disease)   ? takes Pepcid daily  ? History of blood clots   ? left leg 3-28yr ago  ? Hyperlipidemia   ? Hypertension   ? Hypertriglyceridemia   ? Inguinal hernia, left 01/2015  ? Muscle spasm   ? Open wound of genital labia   ? Peripheral neuropathy   ? RBBB   ? Seizures ([place]   ? Sepsis ([place] 01/19/2018  ? Sinus tachycardia   ? a. persistent since 2009.  ? Smokers' cough ([place]   ? Status post mastectomy, right 01/14/2020  ? Stroke ([facility] 1989  ? left sided weakness  ? TIA (transient ischemic attack)   ? Tobacco abuse   ? Vulvar abscess 01/23/2018  ?  ?Past Surgical History:  ?Procedure Laterality Date  ? APPLICATION OF WOUND VAC Right 01/29/2020  ? Procedure: APPLICATION OF WOUND VAC;  Surgeon: RRonny Bacon MD;  Location: [facility] ORS;  Service: General;  Laterality: Right;  ? BREAST BIOPSY Right 11/06/2019  ? uKoreacore path pending venus clip  ? GRAFT APPLICATION  39/08/4095 ? Procedure: GRAFT APPLICATION;  Surgeon: EEdrick Kins DPM;  Location: [facility] ORS;  Service: Podiatry;;  ? HERNIA REPAIR Left   ? INCISION AND  DRAINAGE Bilateral 09/21/2021  ? Procedure: INCISION AND DRAINAGE;  Surgeon: [name], DPM;  Location: [facility] ORS;  Service: Podiatry;  Laterality: Bilateral;  ? INCISION AND DRAINAGE ABSCESS N/A 01/26/2018  ? Procedure: INCISION AND DEBRIDEMENT OF VULVAR NECROTIZING SOFT TISSUE INFECTION;  Surgeon: Greer Pickerel, MD;  Location: [place];  Service: General;  Laterality: N/A;  ? INCISION AND DRAINAGE ABSCESS N/A 07/21/2021  ? Procedure: INCISION AND DRAINAGE ABSCESS Scalp;  Surgeon: Robert Bellow, MD;  Location: [facility] ORS;  Service: General;  Laterality: N/A;  scalp and right abdomen  ? INCISION AND DRAINAGE PERIRECTAL ABSCESS N/A 01/22/2018  ? Procedure: IRRIGATION AND DEBRIDEMENT LABIAL/VULVAR AREA;  Surgeon: Coralie Keens, MD;  Location: [place];  Service: General;  Laterality: N/A;  ? [place] N/A 01/29/2018  ? Procedure: IRRIGATION AND DEBRIDEMENT VULVA;  Surgeon: Excell Seltzer, MD;  Location: [place];  Service: General;  Laterality: N/A;  ? INGUINAL HERNIA REPAIR Left 04/08/2015  ? Procedure: OPEN LEFT INGUINAL HERNIA REPAIR WITH MESH;  Surgeon: Ralene Ok, MD;  Location: [place];  Service: General;  Laterality: Left;  ? INSERTION OF MESH Left 04/08/2015  ? Procedure: INSERTION OF MESH;  Surgeon: Ralene Ok, MD;  Location: [place];  Service: General;  Laterality: Left;  ? IRRIGATION AND DEBRIDEMENT ABSCESS N/A 07/21/2021  ? Procedure: IRRIGATION AND DEBRIDEMENT ABSCESS abdomen;  Surgeon: Robert Bellow, MD;  Location: [facility] ORS;  Service: General;  Laterality: N/A;  ? LAPAROSCOPY    ? Endometriosis  ? LEFT HEART CATHETERIZATION WITH CORONARY ANGIOGRAM N/A 12/05/2013  ? Procedure: LEFT HEART CATHETERIZATION WITH CORONARY ANGIOGRAM;  Surgeon: Jettie Booze, MD;  Location: [facility] CATH LAB;  Service: Cardiovascular;  Laterality: N/A;  ? LOWER EXTREMITY ANGIOGRAPHY Left 09/22/2021  ? Procedure: Lower Extremity Angiography;  Surgeon: Katha Cabal, MD;  Location: [place] CV LAB;  Service: Cardiovascular;  Laterality: Left;  ? MASTECTOMY Right   ? right kidney drained    ? SIMPLE MASTECTOMY WITH AXILLARY SENTINEL NODE BIOPSY Right 12/23/2019  ? Procedure: Right SIMPLE MASTECTOMY WITH AXILLARY SENTINEL NODE BIOPSY;  Surgeon: Ronny Bacon, MD;  Location: [facility] ORS;  Service: General;  Laterality: Right;  ? TEE WITHOUT CARDIOVERSION N/A 11/28/2013  ? Procedure: TRANSESOPHAGEAL ECHOCARDIOGRAM (TEE);  Surgeon: Thayer Headings, MD;  Location: [place];  Service: Cardiovascular;  Laterality: N/A;  ? WOUND DEBRIDEMENT Right 01/29/2020  ? Procedure: DEBRIDEMENT WOUND, Excisional debridement skin, subcutaneous and muscle right chest wall;  Surgeon: Ronny Bacon, MD;  Location: [facility]  ORS;  Service: General;  Laterality: Right;  ?  ?Social History  ? ?Socioeconomic History  ? Marital status: Married  ?  Spouse name: Not on file  ? Number of children: 2  ? Years of education: Not on file  ? Highest education level: Not on file  ?Occupational History  ? Not on file  ?Tobacco Use  ? Smoking status: Every Day  ?  Packs/day: 0.25  ?  Years: 36.00  ?  Pack years: 9.00  ?  Types: Cigarettes  ?  Start date: 07/11/1981  ? Smokeless tobacco: Never  ? Tobacco comments:  ?  1 ppd now  ?Vaping Use  ? Vaping Use: Some days  ?Substance and Sexual Activity  ? Alcohol use: No  ? Drug use: No  ? Sexual activity: Not Currently  ?Other Topics Concern  ? Not on file  ?Social History Narrative  ? ** Merged History Encounter **  ?    ?  Lives in [place]  ? Married but lives with Boyfriend - not legally separated  ? Disabled - for BiPolar, Seizure disorder, diabetes  ? Formerly worked at [employer]  ?   ? Smokes 1 ppd; no alcohol. 2 step sons; no biologic children.   ?   ? ?Social Determinants of Health  ? ?Financial Resource Strain: Not on file  ?Food Insecurity: No Food Insecurity  ? Worried About Charity fundraiser in the Last Year: Never true  ? Ran Out of Food in the Last Year: Never true  ?Transportation Needs: Not on file  ?Physical Activity: Not on file  ?Stress: Not on file  ?Social Connections: Not on file  ?Intimate Partner Violence: Not on file  ?  ?Family History  ?Problem Relation Age of Onset  ? Venous thrombosis Brother   ? Other Brother   ?     BRAIN TUMOR  ? Asthma Father   ? Diabetes Father   ? Coronary artery disease Mother   ? Hypertension Mother   ? Diabetes Mother   ? Breast cancer Mother 41  ? Asthma Sister   ? Diabetes type II Brother   ? Diabetes Brother   ? ?Allergies  ?Allergen Reactions  ? Adhesive [Tape] Rash and Other (See Comments)  ?  TAKES OFF THE SKIN (CERTAIN MEDICAL TAPES DO THIS!!)  ? Metoprolol Shortness Of Breath  ?  Occurrence of shortness of breath after 3 days  ? Montelukast  Shortness Of Breath  ? Morphine Sulfate Anaphylaxis, Shortness Of Breath and Nausea And Vomiting  ?  Swollen Throat - Able to tolerate dilaudid  ? Penicillins Anaphylaxis, Hives and Shortness Of Breath  ?  Throat swells ?

## 2021-10-08 NOTE — Plan of Care (Signed)
?  Problem: Clinical Measurements: ?Goal: Ability to avoid or minimize complications of infection will improve ?Outcome: Progressing ?  ?Problem: Skin Integrity: ?Goal: Skin integrity will improve ?Outcome: Progressing ?  ?

## 2021-10-08 NOTE — Assessment & Plan Note (Addendum)
-   Blood cultures from admission positive for 2/4 bottles (1 set only) of GPC which speciated to MRSA.  Given speciation to MRSA, considered infectious ?- TTE is negative for vegetation; TEE currently canceled while BKA work-up commenced.  Likely still needs TEE to r/o endocarditis ?- continue vanc per pharmacy ?-ID following  ?

## 2021-10-08 NOTE — Hospital Course (Addendum)
Ms. Boulos is a 51 yo female with PMH PVD, HTN, HLD, DMII, COPD, asthma, CVA, GERD, depression/anxiety, tobacco abuse, seizure d/o, anemia, CAD, CKD3a, dCHF, right breast cancer (s/p mastectomy) who presented with worsening pain and swelling in her left foot and a nonhealing wound. ? ?She was recently hospitalized from 09/20/2021 until 09/23/2021 for a diabetic infection involving her left foot and she had undergone debridement on 09/21/2021 with podiatry in the OR.  She also had vascular surgery evaluation with abdominal aortogram and left lower extremity angiogram (no significant stenosis/no intervention required).  ? ?She had been healing fairly well at home until a few days prior to admission when she developed redness, pain, swelling of her left leg.  She also has had an ongoing diffuse rash presumed a drug rash possibly from clindamycin at discharge.  She remains a poor historian and was unable to elaborate on her medications once again and whether or not the rash preceded starting clindamycin. ?She was started on antibiotics and podiatry was also consulted.  She also underwent vascular surgery consultation given concern for poor healing.  Recommendation was made for left BKA. ?She was also evaluated by infectious disease due to positive blood cultures from admission with MRSA which was considered seeded from her foot wound.  TTE was negative for vegetations however TEE was canceled in setting of BKA. ?

## 2021-10-08 NOTE — Consult Note (Signed)
[facility] Cardiology ? ?CARDIOLOGY CONSULT NOTE  ?Patient ID: ?[name] ?MRN: [id number] ?DOB/AGE: 14-Sep-1970 51 y.o. ? ?Admit date: 10/07/2021 ?Referring Physician Girguis ?Primary Physician Carlis Abbott ?Primary Cardiologist Corky Sox ?Reason for Consultation bacteremia ? ?HPI: 51 year old female referred to evaluate for possible TEE in the setting of bacteremia.  The patient has known history of insignificant coronary artery disease by cardiac catheterization 2015, and chronic diastolic congestive heart failure with HFpEF, EF 50 to 60% by 2D echocardiogram 05/24/2021.  The patient is admitted with left lower extremity cellulitis and chronic foot ulcer and is found to have MRSA bacteremia.  Patient has been seen by infectious disease who recommends transesophageal echocardiogram to rule out SBE. ? ?Review of systems complete and found to be negative unless listed above  ? ? ? ?Past Medical History:  ?Diagnosis Date  ? Anxiety   ? takes Prozac daily  ? Anxiety   ? Aortic valve calcification   ? Asthma   ? Advair and Spirva daily  ? Asthma   ? Bipolar disorder ([place])   ? Breast cancer, right breast ([place]) 12/23/2019  ? CAD (coronary artery disease)   ? a. LHC 11/2013 done for CP/fluid retention: mild disease in prox LAD, mild-mod disease in mRCA, EF 60% with normal LVEDP. b. Normal nuc 03/2016.  ? Cancer [facility])   ? Cellulitis and abscess of trunk 03/12/2020  ? CHF (congestive heart failure) ([place])   ? Chronic diastolic CHF (congestive heart failure) ([place])   ? Chronic heart failure with preserved ejection fraction ([place]) 11/16/2018  ? CKD (chronic kidney disease), stage II   ? COPD (chronic obstructive pulmonary disease) ([place])   ? a. nocturnal O2.  ? COPD (chronic obstructive pulmonary disease) ([place])   ? Coronary artery disease   ? Decreased urine stream   ? Diabetes mellitus   ? Diabetes mellitus without complication ([place])   ? Dyspnea   ? Elevated C-reactive protein (CRP) 01/29/2018  ? Elevated sedimentation rate 01/29/2018  ? Elevated serum hCG  01/19/2018  ? Family history of adverse reaction to anesthesia   ? mom gets nauseated  ? Foot pain, bilateral 05/22/2018  ? GERD (gastroesophageal reflux disease)   ? takes Pepcid daily  ? History of blood clots   ? left leg 3-70yr ago  ? Hyperlipidemia   ? Hypertension   ? Hypertriglyceridemia   ? Inguinal hernia, left 01/2015  ? Muscle spasm   ? Open wound of genital labia   ? Peripheral neuropathy   ? RBBB   ? Seizures ([place]   ? Sepsis ([place] 01/19/2018  ? Sinus tachycardia   ? a. persistent since 2009.  ? Smokers' cough ([place]   ? Status post mastectomy, right 01/14/2020  ? Stroke ([facility] 1989  ? left sided weakness  ? TIA (transient ischemic attack)   ? Tobacco abuse   ? Vulvar abscess 01/23/2018  ?  ?Past Surgical History:  ?Procedure Laterality Date  ? APPLICATION OF WOUND VAC Right 01/29/2020  ? Procedure: APPLICATION OF WOUND VAC;  Surgeon: RRonny Bacon MD;  Location: [facility] ORS;  Service: General;  Laterality: Right;  ? BREAST BIOPSY Right 11/06/2019  ? uKoreacore path pending venus clip  ? GRAFT APPLICATION  30/16/5537 ? Procedure: GRAFT APPLICATION;  Surgeon: EEdrick Kins DPM;  Location: [facility] ORS;  Service: Podiatry;;  ? HERNIA REPAIR Left   ? INCISION AND DRAINAGE Bilateral 09/21/2021  ? Procedure: INCISION AND DRAINAGE;  Surgeon: EEdrick Kins DPM;  Location: [facility] OR[facility];  Service:  Podiatry;  Laterality: Bilateral;  ? INCISION AND DRAINAGE ABSCESS N/A 01/26/2018  ? Procedure: INCISION AND DEBRIDEMENT OF VULVAR NECROTIZING SOFT TISSUE INFECTION;  Surgeon: Greer Pickerel, MD;  Location: [place];  Service: General;  Laterality: N/A;  ? INCISION AND DRAINAGE ABSCESS N/A 07/21/2021  ? Procedure: INCISION AND DRAINAGE ABSCESS Scalp;  Surgeon: Robert Bellow, MD;  Location: [facility] ORS;  Service: General;  Laterality: N/A;  scalp and right abdomen  ? INCISION AND DRAINAGE PERIRECTAL ABSCESS N/A 01/22/2018  ? Procedure: IRRIGATION AND DEBRIDEMENT LABIAL/VULVAR AREA;  Surgeon: Coralie Keens, MD;  Location: [place];   Service: General;  Laterality: N/A;  ? [place] N/A 01/29/2018  ? Procedure: IRRIGATION AND DEBRIDEMENT VULVA;  Surgeon: Excell Seltzer, MD;  Location: [place];  Service: General;  Laterality: N/A;  ? INGUINAL HERNIA REPAIR Left 04/08/2015  ? Procedure: OPEN LEFT INGUINAL HERNIA REPAIR WITH MESH;  Surgeon: Ralene Ok, MD;  Location: [place];  Service: General;  Laterality: Left;  ? INSERTION OF MESH Left 04/08/2015  ? Procedure: INSERTION OF MESH;  Surgeon: Ralene Ok, MD;  Location: [place];  Service: General;  Laterality: Left;  ? IRRIGATION AND DEBRIDEMENT ABSCESS N/A 07/21/2021  ? Procedure: IRRIGATION AND DEBRIDEMENT ABSCESS abdomen;  Surgeon: Robert Bellow, MD;  Location: [facility];  Service: General;  Laterality: N/A;  ? LAPAROSCOPY    ? Endometriosis  ? LEFT HEART CATHETERIZATION WITH CORONARY ANGIOGRAM N/A 12/05/2013  ? Procedure: LEFT HEART CATHETERIZATION WITH CORONARY ANGIOGRAM;  Surgeon: Jettie Booze, MD;  Location: [facility] CATH LAB;  Service: Cardiovascular;  Laterality: N/A;  ? LOWER EXTREMITY ANGIOGRAPHY Left 09/22/2021  ? Procedure: Lower Extremity Angiography;  Surgeon: Katha Cabal, MD;  Location: [place] CV LAB;  Service: Cardiovascular;  Laterality: Left;  ? MASTECTOMY Right   ? right kidney drained    ? SIMPLE MASTECTOMY WITH AXILLARY SENTINEL NODE BIOPSY Right 12/23/2019  ? Procedure: Right SIMPLE MASTECTOMY WITH AXILLARY SENTINEL NODE BIOPSY;  Surgeon: Ronny Bacon, MD;  Location: [facility];  Service: General;  Laterality: Right;  ? TEE WITHOUT CARDIOVERSION N/A 11/28/2013  ? Procedure: TRANSESOPHAGEAL ECHOCARDIOGRAM (TEE);  Surgeon: Thayer Headings, MD;  Location: [place];  Service: Cardiovascular;  Laterality: N/A;  ? WOUND DEBRIDEMENT Right 01/29/2020  ? Procedure: DEBRIDEMENT WOUND, Excisional debridement skin, subcutaneous and muscle right chest wall;  Surgeon: Ronny Bacon, MD;  Location: [facility];  Service: General;   Laterality: Right;  ?  ?Medications Prior to Admission  ?Medication Sig Dispense Refill Last Dose  ? ALPRAZolam (XANAX) 1 MG tablet Take 1 mg by mouth 4 (four) times daily as needed for anxiety.    10/07/2021 at 0900  ? anastrozole (ARIMIDEX) 1 MG tablet Take 1 tablet (1 mg total) by mouth daily. 30 tablet 3 10/07/2021 at 0900  ? aspirin EC 81 MG tablet Take 81 mg by mouth daily before breakfast.   10/07/2021 at 0830  ? budesonide-formoterol (SYMBICORT) 80-4.5 MCG/ACT inhaler INHALE 2 PUFFS BY MOUTH EVERY 12 HOURS TO PREVENT COUGH OR WHEEZING *RINSE MOUTH AFTER EACH USE* 10.2 g 2 10/07/2021 at 0900  ? busPIRone (BUSPAR) 30 MG tablet Take 30 mg by mouth 2 (two) times daily.   10/07/2021 at 0900  ? citalopram (CELEXA) 20 MG tablet Take 1 tablet (20 mg total) by mouth daily. 30 tablet 2 10/07/2021 at 0900  ? desvenlafaxine (PRISTIQ) 100 MG 24 hr tablet Take 100 mg by mouth daily.   10/07/2021 at 0900  ? Empagliflozin-metFORMIN  HCl ER 25-1000 MG TB24 Take 1 tablet by mouth daily with breakfast.   10/07/2021 at 0900  ? gentamicin cream (GARAMYCIN) 0.1 % Apply 1 application. topically daily. 30 g 0 Past Week  ? insulin aspart (NOVOLOG) 100 UNIT/ML FlexPen Inject 1-9 Units into the skin 4 (four) times daily -  before meals and at bedtime. Glucose 121 - 150: 1 unit, Glucose 151 - 200: 2 units, Glucose 201 - 250: 3 units, Glucose 251 - 300: 5 units, Glucose 301 - 350: 7 units, Glucose 351 - 400: 9 units, Glucose > 400 call MD 15 mL 11 10/07/2021 at 0900  ? insulin glargine (LANTUS) 100 UNIT/ML Solostar Pen Inject 25 Units into the skin daily. 15 mL 11 10/06/2021 at 2000  ? mupirocin ointment (BACTROBAN) 2 % Apply 1 application topically 2 (two) times daily. 22 g 0 Past Week  ? oxyCODONE-acetaminophen (PERCOCET) 5-325 MG tablet Take 1 tablet by mouth every 6 (six) hours as needed for severe pain. 30 tablet 0 Past Week  ? pregabalin (LYRICA) 300 MG capsule Take 1 capsule (300 mg total) by mouth 2 (two) times daily. For nerve pain. 180  capsule 0 10/07/2021 at 0900  ? rOPINIRole (REQUIP) 1 MG tablet TAKE 1 TABLET BY MOUTH EVERY DAY AT BEDTIME FOR  RESTLESS  LEG 90 tablet 0 10/06/2021 at 2000  ? rosuvastatin (CRESTOR) 20 MG tablet Take 1 tablet (20 mg

## 2021-10-09 ENCOUNTER — Inpatient Hospital Stay
Admit: 2021-10-09 | Discharge: 2021-10-09 | Disposition: A | Discharge status: Home / Self Care | Payer: BC Managed Care – PPO | Attending: Cardiology | Admitting: Cardiology

## 2021-10-09 DIAGNOSIS — L03116 Cellulitis of left lower limb: Secondary | ICD-10-CM | POA: Diagnosis not present

## 2021-10-09 DIAGNOSIS — R7881 Bacteremia: Secondary | ICD-10-CM | POA: Diagnosis not present

## 2021-10-09 LAB — CBC WITH DIFFERENTIAL/PLATELET
Abs Immature Granulocytes: 0.19 10*3/uL — ABNORMAL HIGH (ref 0.00–0.07)
Basophils Absolute: 0 10*3/uL (ref 0.0–0.1)
Basophils Relative: 0 %
Eosinophils Absolute: 0 10*3/uL (ref 0.0–0.5)
Eosinophils Relative: 0 %
HCT: 30.9 % — ABNORMAL LOW (ref 36.0–46.0)
Hemoglobin: 9.7 g/dL — ABNORMAL LOW (ref 12.0–15.0)
Immature Granulocytes: 1 %
Lymphocytes Relative: 4 %
Lymphs Abs: 0.7 10*3/uL (ref 0.7–4.0)
MCH: 26.5 pg (ref 26.0–34.0)
MCHC: 31.4 g/dL (ref 30.0–36.0)
MCV: 84.4 fL (ref 80.0–100.0)
Monocytes Absolute: 0.8 10*3/uL (ref 0.1–1.0)
Monocytes Relative: 4 %
Neutro Abs: 16.7 10*3/uL — ABNORMAL HIGH (ref 1.7–7.7)
Neutrophils Relative %: 91 %
Platelets: 305 10*3/uL (ref 150–400)
RBC: 3.66 MIL/uL — ABNORMAL LOW (ref 3.87–5.11)
RDW: 15.5 % (ref 11.5–15.5)
WBC: 18.3 10*3/uL — ABNORMAL HIGH (ref 4.0–10.5)
nRBC: 0 % (ref 0.0–0.2)

## 2021-10-09 LAB — GLUCOSE, CAPILLARY
Glucose-Capillary: 371 mg/dL — ABNORMAL HIGH (ref 70–99)
Glucose-Capillary: 423 mg/dL — ABNORMAL HIGH (ref 70–99)
Glucose-Capillary: 433 mg/dL — ABNORMAL HIGH (ref 70–99)

## 2021-10-09 LAB — ECHOCARDIOGRAM COMPLETE
Area-P 1/2: 4.86 cm2
Height: 64 in
S' Lateral: 3.06 cm
Weight: 3680 oz

## 2021-10-09 LAB — MAGNESIUM: Magnesium: 2.7 mg/dL — ABNORMAL HIGH (ref 1.7–2.4)

## 2021-10-09 LAB — BASIC METABOLIC PANEL
Anion gap: 14 (ref 5–15)
BUN: 42 mg/dL — ABNORMAL HIGH (ref 6–20)
CO2: 23 mmol/L (ref 22–32)
Calcium: 8.6 mg/dL — ABNORMAL LOW (ref 8.9–10.3)
Chloride: 102 mmol/L (ref 98–111)
Creatinine, Ser: 1.21 mg/dL — ABNORMAL HIGH (ref 0.44–1.00)
GFR, Estimated: 54 mL/min — ABNORMAL LOW (ref >60)
Glucose, Bld: 398 mg/dL — ABNORMAL HIGH (ref 70–99)
Potassium: 4.6 mmol/L (ref 3.5–5.1)
Sodium: 139 mmol/L (ref 135–145)

## 2021-10-09 MED ORDER — ALUM & MAG HYDROXIDE-SIMETH 200-200-20 MG/5ML PO SUSP
30.0000 mL | ORAL | Status: DC | PRN
Start: 2021-10-09 — End: 2021-10-14
  Administered 2021-10-09: 30 mL via ORAL
  Filled 2021-10-09: qty 30

## 2021-10-09 MED ORDER — INSULIN GLARGINE-YFGN 100 UNIT/ML ~~LOC~~ SOLN
25.0000 [IU] | Freq: Every day | SUBCUTANEOUS | Status: DC
  Administered 2021-10-09: 25 [IU] via SUBCUTANEOUS
  Filled 2021-10-09 (×2): qty 0.25

## 2021-10-09 MED ORDER — INSULIN ASPART 100 UNIT/ML IJ SOLN
20.0000 [IU] | Freq: Once | INTRAMUSCULAR | Status: AC
  Administered 2021-10-09: 20 [IU] via SUBCUTANEOUS
  Filled 2021-10-09: qty 1

## 2021-10-09 MED ORDER — INSULIN ASPART 100 UNIT/ML IJ SOLN
15.0000 [IU] | Freq: Once | INTRAMUSCULAR | Status: AC
  Administered 2021-10-09: 15 [IU] via SUBCUTANEOUS
  Filled 2021-10-09: qty 1

## 2021-10-09 NOTE — Progress Notes (Addendum)
?Progress Note ? ? ? ?[name]   ?JOA:[id number]  ?DOB: 1970-11-03  ?DOA: 10/07/2021     1 ?PCP: Pleas Koch, NP ? ?Initial CC: left foot wound ? ?Hospital Course: ?[name] is a 51 yo female with PMH PVD, HTN, HLD, DMII, COPD, asthma, CVA, GERD, depression/anxiety, tobacco abuse, seizure d/o, anemia, CAD, CKD3a, dCHF, right breast cancer (s/p mastectomy) who presented with worsening pain and swelling in her left foot and a nonhealing wound. ? ?She was recently hospitalized from 09/20/2021 until 09/23/2021 for a diabetic infection involving her left foot and she had undergone debridement on 09/21/2021 with podiatry in the OR.  She also had vascular surgery evaluation with abdominal aortogram and left lower extremity angiogram (no significant stenosis/no intervention required).  ? ?She had been healing fairly well at home until a few days prior to admission when she developed redness, pain, swelling of her left leg.  She also has had an ongoing diffuse rash presumed a drug rash possibly from clindamycin at discharge.  She remains a poor historian and was unable to elaborate on her medications once again and whether or not the rash preceded starting clindamycin. ?She was started on antibiotics and podiatry was also consulted. ? ?Interval History:  ?No events overnight.  Resting in bed comfortable this morning.  Was wanting to start to get out of bed when able. ? ?Assessment and Plan: ?* Cellulitis of left lower extremity ?- Necrotic wound on dorsal foot with purulent drainage appreciated on plantar surface.  Foul-smelling ?-Per ID, infection felt to be slowly contributed to MRSA.  Rocephin and Flagyl have been discontinued.  Remains on monotherapy vancomycin ?-Appreciate podiatry evaluation, monitoring for now on antibiotics; follow wound care rec's per podiatry too ? ?Bacteremia ?- Blood cultures from admission positive for 2/4 bottles (1 set only) of GPC which speciated to MRSA.  Etiology would be  considered contamination versus infectious from foot wound (hygeine is questionable and true bacteremia is a practical consideration) ?- ID does believe bacteremia is true ?- TTE is negative for vegetation; I do appreciate a murmur but not sure if this is chronic  ?- continue vanc per pharmacy ?- cardiology following; likely to undergo TEE this coming week since negative TTE ?-ID following  ? ?Uncontrolled type 2 diabetes mellitus with hyperglycemia, with long-term current use of insulin (HCC) ?- A1c 10.1% ?- Patient had difficulty with her regimen last hospitalization and questionable compliance ?- Continue on SSI and Semglee.  Will adjust as necessary ?- Continue CBG monitoring ? ?Allergic reaction ?- Drug rash appreciated on abdomen, lower chest, and thighs.  Patient has been taking Benadryl at home with minimal improvement.  She denies pruritus.  Possible reaction to previously prescribed clindamycin was about her only new medication.  She had a similar rash but smaller last hospitalization after having been on doxycycline ?-For now treating with Benadryl, Pepcid, steroid.  Rash to diffuse for cream at this time ? ?Hyponatremia ?Most likely secondary to hyperglycemia and possibly medication induced ?Hold spironolactone ? ?Chronic diastolic CHF (congestive heart failure) ([place]) ?Hold spironolactone due to hyponatremia ?Monitor respiratory status closely ? ?Chronic foot ulcer with necrosis of muscle, left ([place]) ?- See cellulitis ? ?Breast cancer ([facility]) ?- Continue anastrozole ?- s/p right mastectomy  ? ?CAD (coronary artery disease) ?Continue aspirin and Crestor ? ?Chronic kidney disease, stage 3a ([place]) ?- patient has history of CKD3a. Baseline creat ~ 1 - 1.1, eGFR 50closely ? ?PAD (peripheral artery disease) ([place]) ?- underwent angiogram of abdomen  and left lower extremity on 09/22/2021 with no significant stenosis nor stenting required ?-Continue aspirin and Crestor ? ?Bipolar disorder ([place]) ?Continue trazodone,  Pristiq, buspirone and Celexa ? ?COPD with chronic bronchitis ([place]) ?Not acutely exacerbated ?Continue as needed bronchodilator therapy as well as inhaled steroids ? ?Tobacco abuse ?Smoking cessation discussed with patient ?-Continue nicotine patch ? ?Morbid (severe) obesity due to excess calories ([place]) ?Complicates overall prognosis and care ?Lifestyle modification and exercise has been discussed with patient in detail ? ? ? ?Old records reviewed in assessment of this patient ? ?Antimicrobials: ?Rocephin 10/07/2021 >> 10/08/2021 ?Vancomycin 10/07/2021 >> current ?Flagyl 10/08/2021 >> 10/08/2021 ? ?DVT prophylaxis:  ?  Lovenox ? ? ?Code Status:   Code Status: Full Code ? ?Disposition Plan:  pending clinical course ([facility] vs SNF) ?Status is: Inpt ? ?Objective: ?Blood pressure 113/72, pulse 75, temperature 97.7 ?F (36.5 ?C), resp. rate 18, height 5' 4"  (1.626 m), weight 104.3 kg, SpO2 99 %.  ?Examination:  ?Physical Exam ?Constitutional:   ?   General: She is not in acute distress. ?   Appearance: Normal appearance.  ?HENT:  ?   Head: Normocephalic and atraumatic.  ?   Mouth/Throat:  ?   Mouth: Mucous membranes are moist.  ?Eyes:  ?   Extraocular Movements: Extraocular movements intact.  ?Cardiovascular:  ?   Rate and Rhythm: Normal rate and regular rhythm.  ?   Comments: 2/6 suspected diastolic murmur/click best noted in LUSB and LLSB ?Pulmonary:  ?   Effort: Pulmonary effort is normal.  ?   Breath sounds: Normal breath sounds.  ?Abdominal:  ?   General: Bowel sounds are normal. There is no distension.  ?   Palpations: Abdomen is soft.  ?   Tenderness: There is no abdominal tenderness.  ?Musculoskeletal:  ?   Cervical back: Normal range of motion and neck supple.  ?   Comments: Left foot with large necrotic dry wound on dorsal surface; approx 3 cm diameter blister on plantar surface near toes draining foul smelling pus; left leg dense erythema with calor and mild dolor   ?Neurological:  ?   Mental Status: She is alert.  ?   ?Pic from 10/08/21: ? ? ? ? ?Consultants:  ?Podiatry ? ?Procedures:  ? ? ?Data Reviewed: ?Results for orders placed or performed during the hospital encounter of 10/07/21 (from the past 24 hour(s))  ?Aerobic Culture w Gram Stain (superficial specimen)     Status: None (Preliminary result)  ? Collection Time: 10/08/21  1:45 PM  ? Specimen: Foot  ?Result Value Ref Range  ? Specimen Description    ?  FOOT LEFT ?Performed at [facility], [address] [place], [place] [address] ?  ? Special Requests    ?  NONE ?Performed at [facility], [address] [place], [place] [address] ?  ? Gram Stain NO WBC SEEN ?FEW GRAM POSITIVE COCCI IN CLUSTERS ?   ? Culture    ?  TOO YOUNG TO READ ?Performed at [facility], [place] [address] [place], [place] [address] ?  ? Report Status PENDING   ?Glucose, capillary     Status: Abnormal  ? Collection Time: 10/08/21  4:51 PM  ?Result Value Ref Range  ? Glucose-Capillary 294 (H) 70 - 99 mg/dL  ?Glucose, capillary     Status: Abnormal  ? Collection Time: 10/08/21  8:43 PM  ?Result Value Ref Range  ? Glucose-Capillary 339 (H) 70 - 99 mg/dL  ? Comment 1  Notify RN   ?Basic metabolic panel     Status: Abnormal  ? Collection Time: 10/09/21  4:16 AM  ?Result Value Ref Range  ? Sodium 139 135 - 145 mmol/L  ? Potassium 4.6 3.5 - 5.1 mmol/L  ? Chloride 102 98 - 111 mmol/L  ? CO2 23 22 - 32 mmol/L  ? Glucose, Bld 398 (H) 70 - 99 mg/dL  ? BUN 42 (H) 6 - 20 mg/dL  ? Creatinine, Ser 1.21 (H) 0.44 - 1.00 mg/dL  ? Calcium 8.6 (L) 8.9 - 10.3 mg/dL  ? GFR, Estimated 54 (L) >60 mL/min  ? Anion gap 14 5 - 15  ?CBC with Differential/Platelet     Status: Abnormal  ? Collection Time: 10/09/21  4:16 AM  ?Result Value Ref Range  ? WBC 18.3 (H) 4.0 - 10.5 K/uL  ? RBC 3.66 (L) 3.87 - 5.11 MIL/uL  ? Hemoglobin 9.7 (L) 12.0 - 15.0 g/dL  ? HCT 30.9 (L) 36.0 - 46.0 %  ? MCV 84.4 80.0 - 100.0 fL  ? MCH 26.5 26.0 - 34.0 pg  ? MCHC 31.4 30.0 - 36.0 g/dL  ? RDW 15.5 11.5 - 15.5 %  ? Platelets  305 150 - 400 K/uL  ? nRBC 0.0 0.0 - 0.2 %  ? Neutrophils Relative % 91 %  ? Neutro Abs 16.7 (H) 1.7 - 7.7 K/uL  ? Lymphocytes Relative 4 %  ? Lymphs Abs 0.7 0.7 - 4.0 K/uL  ? Monocytes Relative 4 %  ? Monocytes Abs

## 2021-10-09 NOTE — Plan of Care (Signed)
?  Problem: Clinical Measurements: ?Goal: Ability to avoid or minimize complications of infection will improve ?Outcome: Progressing ?  ?Problem: Skin Integrity: ?Goal: Skin integrity will improve ?Outcome: Progressing ?  ?

## 2021-10-09 NOTE — Progress Notes (Signed)
[facility] Cardiology ? ?SUBJECTIVE: Patient laying in bed, denies chest pain or shortness of breath ? ? ?Vitals:  ? 10/08/21 2042 10/09/21 0029 10/09/21 3559 10/09/21 0752  ?BP: 137/72 128/68 116/63 (!) 115/57  ?Pulse: 74 67 69 70  ?Resp: '20 18 18 15  '$ ?Temp: 99 ?F (37.2 ?C) 98 ?F (36.7 ?C) 98.9 ?F (37.2 ?C) 97.9 ?F (36.6 ?C)  ?TempSrc:      ?SpO2: 97% 96% 98% 99%  ?Weight:      ?Height:      ? ? ? ?Intake/Output Summary (Last 24 hours) at 10/09/2021 1131 ?Last data filed at 10/09/2021 0437 ?Gross per 24 hour  ?Intake --  ?Output 2850 ml  ?Net -2850 ml  ? ? ? ? ?PHYSICAL EXAM ? ?General: Well developed, well nourished, in no acute distress ?HEENT:  Normocephalic and atramatic ?Neck:  No JVD.  ?Lungs: Clear bilaterally to auscultation and percussion. ?Heart: HRRR . Normal S1 and S2 without gallops or murmurs.  ?Abdomen: Bowel sounds are positive, abdomen soft and non-tender  ?Msk:  Back normal, normal gait. Normal strength and tone for age. ?Extremities: No clubbing, cyanosis or edema.   ?Neuro: Alert and oriented X 3. ?Psych:  Good affect, responds appropriately ? ? ?LABS: ?Basic Metabolic Panel: ?Recent Labs  ?  10/08/21 ?0432 10/09/21 ?0416  ?NA 137 139  ?K 3.7 4.6  ?CL 102 102  ?CO2 26 23  ?GLUCOSE 211* 398*  ?BUN 27* 42*  ?CREATININE 1.04* 1.21*  ?CALCIUM 8.3* 8.6*  ?MG  --  2.7*  ? ?Liver Function Tests: ?No results for input(s): AST, ALT, ALKPHOS, BILITOT, PROT, ALBUMIN in the last 72 hours. ?No results for input(s): LIPASE, AMYLASE in the last 72 hours. ?CBC: ?Recent Labs  ?  10/08/21 ?0432 10/09/21 ?0416  ?WBC 16.9* 18.3*  ?NEUTROABS  --  16.7*  ?HGB 9.4* 9.7*  ?HCT 29.5* 30.9*  ?MCV 83.1 84.4  ?PLT 266 305  ? ?Cardiac Enzymes: ?No results for input(s): CKTOTAL, CKMB, CKMBINDEX, TROPONINI in the last 72 hours. ?BNP: ?Invalid input(s): POCBNP ?D-Dimer: ?No results for input(s): DDIMER in the last 72 hours. ?Hemoglobin A1C: ?No results for input(s): HGBA1C in the last 72 hours. ?Fasting Lipid Panel: ?No results for  input(s): CHOL, HDL, LDLCALC, TRIG, CHOLHDL, LDLDIRECT in the last 72 hours. ?Thyroid Function Tests: ?No results for input(s): TSH, T4TOTAL, T3FREE, THYROIDAB in the last 72 hours. ? ?Invalid input(s): FREET3 ?Anemia Panel: ?No results for input(s): VITAMINB12, FOLATE, FERRITIN, TIBC, IRON, RETICCTPCT in the last 72 hours. ? ?DG Foot Complete Left ? ?Result Date: 10/07/2021 ?CLINICAL DATA:  Swelling and pain.  Ulcerations. EXAM: LEFT FOOT - COMPLETE 3+ VIEW COMPARISON:  Left foot radiographs 09/20/2021. FINDINGS: Acute comminuted fracture present in the distal left fifth metatarsal. A new nondisplaced fracture is present at the lateral malleolus. Diffuse soft tissue swelling is present over the anterior foot. Soft swelling is present the ankle as well. IMPRESSION: 1. Acute comminuted fracture of the distal left fifth metatarsal. 2. New nondisplaced fracture at the lateral malleolus. 3. Diffuse soft tissue swelling. Electronically Signed   By: San Morelle M.D.   On: 10/07/2021 15:03   ? ? ?Echo pending ? ?TELEMETRY: : ? ?ASSESSMENT AND PLAN: ? ?Principal Problem: ?  Cellulitis of left lower extremity ?Active Problems: ?  Morbid (severe) obesity due to excess calories ([place]) ?  Tobacco abuse ?  COPD with chronic bronchitis ([place]) ?  Bipolar disorder ([place]) ?  Chronic diastolic CHF (congestive heart failure) ([place]) ?  Uncontrolled  type 2 diabetes mellitus with hyperglycemia, with long-term current use of insulin ([place]) ?  PAD (peripheral artery disease) ([place]) ?  Chronic kidney disease, stage 3a ([place]) ?  CAD (coronary artery disease) ?  Breast cancer ([place]) ?  Hyponatremia ?  Chronic foot ulcer with necrosis of muscle, left ([place]) ?  Bacteremia ?  Allergic reaction ?  ? ?1. Left lower extremity cellulitis, necrotic nonhealing wound left foot, MRSA bacteremia ?2.  Chronic diastolic congestive heart failure, HFpEF, EF 50 to 60% by 2D echocardiogram 05/24/2021, BNP 88 on 09/20/2021, currently appears euvolemic ?3.   Insignificant CAD by cardiac catheterization 2015 ?4.  Chronic kidney disease, stage IIIa ?  ?Recommendations ?  ?1.  Agree with current therapy ?2.  Review transthoracic 2D echocardiogram, if no evidence for vegetation then will proceed with TEE early next week ?3.  Continue spironolactone ? ? ?Isaias Cowman, MD, PhD, [facility] ?10/09/2021 ?11:31 AM ? ? ? ?  ?

## 2021-10-09 NOTE — Progress Notes (Incomplete)
*  PRELIMINARY RESULTS* ?Echocardiogram ?2D Echocardiogram has been performed. ? ?[name] ?10/09/2021, 11:41 AM ?

## 2021-10-09 NOTE — Progress Notes (Signed)
? ?PODIATRY PROGRESS NOTE ? ?NAME [name] ?MRN [id number] ?DOB 12-29-1970 ?DOA 10/07/2021  ? ?Reason for consult:  ?Chief Complaint  ?Patient presents with  ? Weakness  ? ? ?History of present illness: 51 y.o. female presenting in bed this afternoon resting comfortably.  PMHx PVD with recent revascularization, DM type II, nonhealing wounds left foot with worsening pain and swelling.  Hospitalized 3/13-3/16 at which time debridement was performed.  She has since developed thick eschar overlying the wound with worsening ischemic gangrene to the wounds likely secondary to small vessel disease ? ?Past Medical History:  ?Diagnosis Date  ? Anxiety   ? takes Prozac daily  ? Anxiety   ? Aortic valve calcification   ? Asthma   ? Advair and Spirva daily  ? Asthma   ? Bipolar disorder ([place])   ? Breast cancer, right breast ([place]) 12/23/2019  ? CAD (coronary artery disease)   ? a. LHC 11/2013 done for CP/fluid retention: mild disease in prox LAD, mild-mod disease in mRCA, EF 60% with normal LVEDP. b. Normal nuc 03/2016.  ? Cancer [facility])   ? Cellulitis and abscess of trunk 03/12/2020  ? CHF (congestive heart failure) ([place])   ? Chronic diastolic CHF (congestive heart failure) ([place])   ? Chronic heart failure with preserved ejection fraction ([place]) 11/16/2018  ? CKD (chronic kidney disease), stage II   ? COPD (chronic obstructive pulmonary disease) ([place])   ? a. nocturnal O2.  ? COPD (chronic obstructive pulmonary disease) ([place])   ? Coronary artery disease   ? Decreased urine stream   ? Diabetes mellitus   ? Diabetes mellitus without complication ([place])   ? Dyspnea   ? Elevated C-reactive protein (CRP) 01/29/2018  ? Elevated sedimentation rate 01/29/2018  ? Elevated serum hCG 01/19/2018  ? Family history of adverse reaction to anesthesia   ? mom gets nauseated  ? Foot pain, bilateral 05/22/2018  ? GERD (gastroesophageal reflux disease)   ? takes Pepcid daily  ? History of blood clots   ? left leg 3-36yr ago  ? Hyperlipidemia   ? Hypertension    ? Hypertriglyceridemia   ? Inguinal hernia, left 01/2015  ? Muscle spasm   ? Open wound of genital labia   ? Peripheral neuropathy   ? RBBB   ? Seizures ([place]   ? Sepsis ([place] 01/19/2018  ? Sinus tachycardia   ? a. persistent since 2009.  ? Smokers' cough ([place]   ? Status post mastectomy, right 01/14/2020  ? Stroke ([facility]  ? left sided weakness  ? TIA (transient ischemic attack)   ? Tobacco abuse   ? Vulvar abscess 01/23/2018  ? ? ? ?  Latest Ref Rng & Units 10/09/2021  ?  4:16 AM 10/08/2021  ?  4:32 AM 10/07/2021  ?  1:47 PM  ?CBC  ?WBC 4.0 - 10.5 K/uL 18.3   16.9   17.9    ?Hemoglobin 12.0 - 15.0 g/dL 9.7   9.4   10.1    ?Hematocrit 36.0 - 46.0 % 30.9   29.5   32.6    ?Platelets 150 - 400 K/uL 305   266   261    ? ? ? ?  Latest Ref Rng & Units 10/09/2021  ?  4:16 AM 10/08/2021  ?  4:32 AM 10/07/2021  ?  1:47 PM  ?BMP  ?Glucose 70 - 99 mg/dL 398   211   428    ?BUN 6 - 20 mg/dL 42  27   29    ?Creatinine 0.44 - 1.00 mg/dL 1.21   1.04   1.23    ?Sodium 135 - 145 mmol/L 139   137   126    ?Potassium 3.5 - 5.1 mmol/L 4.6   3.7   4.4    ?Chloride 98 - 111 mmol/L 102   102   91    ?CO2 22 - 32 mmol/L '23   26   25    '$ ?Calcium 8.9 - 10.3 mg/dL 8.6   8.3   8.3    ? ? ? ? ? ? ? ?  ?Physical Exam: ?General: The patient is alert and oriented x3 in no acute distress.  ? ?Dermatology: Large wounds with heavily adhered thick eschar noted to both the dorsum and heel of the left foot.  There is also some skin breakdown to the plantar lateral aspect of the forefoot.  No significant malodor. ? ?Lower extremity angiography 09/22/2021: ?   Left Lower Extremity: The left common femoral profundofemoral superficial femoral and popliteal arteries are widely patent.  There are no significant atherosclerotic changes noted.  There is no calcifications appreciated.  There is a smooth tapering in the mid popliteal that measures 30% and is not hemodynamically significant.  The trifurcation is widely patent the origin of the anterior tibial  demonstrates a short segment 20 to 25% narrowing but this does not appear to be hemodynamically significant.  Tibioperoneal trunk peroneal and posterior tibial are all widely patent.  At the level of the foot the posterior tibial is the dominant contributor filling the plantar arteries with hyperemic response in the heel area.  This is the direct flow to the heel ulcer.  The anterior tibial does fill the dorsalis pedis although this is not quite as brisk as the posterior tibial it is widely patent.  The peroneal does coursed down to the ankle but does not appear to give large collaterals to either the dorsalis pedis or plantar artery distribution. ? ?Summary: ?Patient's arterial flow is widely patent.  There were just a few nonflow limiting lesions noted with the dominant tibial runoff via the posterior tibial which is the optimal flow pattern for healing the heel ulcer. ? ?Neurological: Light touch and protective threshold diminished bilaterally.  ? ?Musculoskeletal Exam:.  Patient ambulatory.  No structural deformity noted. ? ? ? ?ASSESSMENT/PLAN OF CARE ?1.  Worsening wounds left heel and foot ?-I would suspect that the patient's wounds are mostly small vessel disease.  After discussion with the patient she says that both of her parents both had the same condition which eventually led to below-knee amputation. ?-After discussing with the patient and evaluating the foot, I do believe that the patient's likelihood of healing even with aggressive debridement is very slim.  It is not unreasonable to consider below-knee amputation at this time due to the extensive eschar of the wounds and ischemic nature of the entirety of the posterior heel.  The patient agrees and would like to proceed with below-knee amputation.  I will defer this to vascular.  ?-Limb salvage option would be additional extensive debridement with possible application of wound VAC.  Again, I do believe this is related to small vessel disease and she  likely has poor potential for healing since the wound is deteriorated significantly since last or debridement ?-Continue Betadine wet-to-dry dressings daily ?-We will follow for now and sign off if vascular agrees to proceed with below-knee amputation ? ?  ?[name], DPM ?[facility] ? ?Dr. [name], DPM  ?  ?[address]                                        ?[place], [place] [address]                ?Office [phone]  ?Fax [phone] ? ? ? ? ?

## 2021-10-10 DIAGNOSIS — L03116 Cellulitis of left lower limb: Secondary | ICD-10-CM | POA: Diagnosis not present

## 2021-10-10 DIAGNOSIS — R7881 Bacteremia: Secondary | ICD-10-CM | POA: Diagnosis not present

## 2021-10-10 LAB — CBC WITH DIFFERENTIAL/PLATELET
Abs Immature Granulocytes: 0.2 10*3/uL — ABNORMAL HIGH (ref 0.00–0.07)
Basophils Absolute: 0 10*3/uL (ref 0.0–0.1)
Basophils Relative: 0 %
Eosinophils Absolute: 0.2 10*3/uL (ref 0.0–0.5)
Eosinophils Relative: 1 %
HCT: 32.4 % — ABNORMAL LOW (ref 36.0–46.0)
Hemoglobin: 10 g/dL — ABNORMAL LOW (ref 12.0–15.0)
Immature Granulocytes: 1 %
Lymphocytes Relative: 4 %
Lymphs Abs: 0.6 10*3/uL — ABNORMAL LOW (ref 0.7–4.0)
MCH: 25.6 pg — ABNORMAL LOW (ref 26.0–34.0)
MCHC: 30.9 g/dL (ref 30.0–36.0)
MCV: 82.9 fL (ref 80.0–100.0)
Monocytes Absolute: 0.6 10*3/uL (ref 0.1–1.0)
Monocytes Relative: 4 %
Neutro Abs: 12.9 10*3/uL — ABNORMAL HIGH (ref 1.7–7.7)
Neutrophils Relative %: 90 %
Platelets: 343 10*3/uL (ref 150–400)
RBC: 3.91 MIL/uL (ref 3.87–5.11)
RDW: 15.8 % — ABNORMAL HIGH (ref 11.5–15.5)
WBC: 14.5 10*3/uL — ABNORMAL HIGH (ref 4.0–10.5)
nRBC: 0 % (ref 0.0–0.2)

## 2021-10-10 LAB — BASIC METABOLIC PANEL
Anion gap: 9 (ref 5–15)
BUN: 46 mg/dL — ABNORMAL HIGH (ref 6–20)
CO2: 27 mmol/L (ref 22–32)
Calcium: 8.5 mg/dL — ABNORMAL LOW (ref 8.9–10.3)
Chloride: 99 mmol/L (ref 98–111)
Creatinine, Ser: 1.01 mg/dL — ABNORMAL HIGH (ref 0.44–1.00)
GFR, Estimated: >60 mL/min (ref >60)
Glucose, Bld: 362 mg/dL — ABNORMAL HIGH (ref 70–99)
Potassium: 4.5 mmol/L (ref 3.5–5.1)
Sodium: 135 mmol/L (ref 135–145)

## 2021-10-10 LAB — GLUCOSE, CAPILLARY
Glucose-Capillary: 341 mg/dL — ABNORMAL HIGH (ref 70–99)
Glucose-Capillary: 308 mg/dL — ABNORMAL HIGH (ref 70–99)
Glucose-Capillary: 255 mg/dL — ABNORMAL HIGH (ref 70–99)
Glucose-Capillary: 293 mg/dL — ABNORMAL HIGH (ref 70–99)
Glucose-Capillary: 241 mg/dL — ABNORMAL HIGH (ref 70–99)

## 2021-10-10 LAB — CULTURE, BLOOD (ROUTINE X 2): Special Requests: ADEQUATE

## 2021-10-10 LAB — VANCOMYCIN, TROUGH: Vancomycin Tr: 25 ug/mL (ref 15–20)

## 2021-10-10 LAB — VANCOMYCIN, PEAK: Vancomycin Pk: 43 ug/mL — ABNORMAL HIGH (ref 30–40)

## 2021-10-10 LAB — TYPE AND SCREEN
ABO/RH(D): O POS
Antibody Screen: NEGATIVE

## 2021-10-10 LAB — MAGNESIUM: Magnesium: 2.5 mg/dL — ABNORMAL HIGH (ref 1.7–2.4)

## 2021-10-10 MED ORDER — INSULIN ASPART 100 UNIT/ML IJ SOLN
11.0000 [IU] | Freq: Once | INTRAMUSCULAR | Status: AC
  Administered 2021-10-10: 11 [IU] via SUBCUTANEOUS
  Filled 2021-10-10: qty 1

## 2021-10-10 MED ORDER — INSULIN GLARGINE-YFGN 100 UNIT/ML ~~LOC~~ SOLN
30.0000 [IU] | Freq: Every day | SUBCUTANEOUS | Status: DC
  Administered 2021-10-10: 30 [IU] via SUBCUTANEOUS
  Filled 2021-10-10 (×2): qty 0.3

## 2021-10-10 MED ORDER — SODIUM CHLORIDE 0.9 % IV SOLN: INTRAVENOUS | Status: DC

## 2021-10-10 MED ORDER — VANCOMYCIN HCL IN DEXTROSE 1-5 GM/200ML-% IV SOLN
1000.0000 mg | INTRAVENOUS | Status: DC
  Administered 2021-10-11 – 2021-10-13 (×3): 1000 mg via INTRAVENOUS
  Filled 2021-10-10 (×3): qty 200

## 2021-10-10 NOTE — Progress Notes (Signed)
Cross Cover ?Total 31 units supplemental insulin ordered overnight for hyperglycemia ?

## 2021-10-10 NOTE — TOC Initial Note (Signed)
Transition of Care (TOC) - Initial/Assessment Note  ? ? ?Patient Details  ?Name: [name] ?MRN: [id number] ?Date of Birth: 04-06-1971 ? ?Transition of Care (TOC) CM/SW Contact:    ?Adelene Amas, LCSW ?Phone Number: [phone] ?10/10/2021, 11:06 AM ? ?Clinical Narrative:                 ? ?Patient presents to [facility] due to urine smelling bad, pt history of CKD, anxiety, asthma, COPD, CHF, chronic wound to left foot currently being [facility], presents to the emergency department for weakness and fatigue. Patient has met with Vascular Surgeon, plan for BKA on 10/11/2021 or 10/12/2021. Main contact, spouse Oluwademilade Kellett [phone]. ? ? ?Expected Discharge Plan: [place] ?Barriers to Discharge: Continued Medical Work up ? ? ?Patient Goals and CMS Choice ?  ?  ?  ? ?Expected Discharge Plan and Services ?Expected Discharge Plan: [place] ?In-house Referral: Clinical Social Work ?  ?Post Acute Care Choice: Home Health ?Living arrangements for the past 2 months: [place] ?                ?  ?  ?  ?  ?  ?  ?  ?  ?  ?  ? ?Prior Living Arrangements/Services ?Living arrangements for the past 2 months: [place] ?Lives with:: Spouse (Heiberger,[name] (Spouse)   [phone]) ?Patient language and need for interpreter reviewed:: Yes ?Do you feel safe going back to the place where you live?: Yes      ?Need for Family Participation in Patient Care: Yes (Comment) ?Care giver support system in place?: Yes (comment) ?  ?Criminal Activity/Legal Involvement Pertinent to Current Situation/Hospitalization: No - Comment as needed ? ?Activities of Daily Living ?Home Assistive Devices/Equipment: Gilford Rile (specify type) ?ADL Screening (condition at time of admission) ?Patient's cognitive ability adequate to safely complete daily activities?: Yes ?Is the patient deaf or have difficulty hearing?: No ?Does the patient have difficulty seeing, even when wearing glasses/contacts?:  No ?Does the patient have difficulty concentrating, remembering, or making decisions?: No ?Patient able to express need for assistance with ADLs?: Yes ?Does the patient have difficulty dressing or bathing?: No ?Independently performs ADLs?: Yes (appropriate for developmental age) ?Does the patient have difficulty walking or climbing stairs?: Yes ?Weakness of Legs: None ?Weakness of Arms/Hands: None ? ?Permission Sought/Granted ?Permission sought to share information with : Family Supports ?Permission granted to share information with : Yes, Verbal Permission Granted ? Share Information with NAME: Mickelle, Goupil (Spouse)   [phone] ?   ?   ?   ? ?Emotional Assessment ?Appearance:: Appears stated age ?Attitude/Demeanor/Rapport: Engaged ?Affect (typically observed): Stable ?Orientation: : Oriented to Self, Oriented to Place, Oriented to  Time, Oriented to Situation ?Alcohol / Substance Use: Not Applicable ?Psych Involvement: No (comment) ? ?Admission diagnosis:  Cellulitis [L03.90] ?Weakness [R53.1] ?Cellulitis of left lower extremity [L03.116] ?Sepsis, due to unspecified organism, unspecified whether acute organ dysfunction present ([place]) [A41.9] ?Patient Active Problem List  ? Diagnosis Date Noted  ? Bacteremia 10/08/2021  ? Allergic reaction 10/08/2021  ? Cellulitis 10/07/2021  ? Hyponatremia 10/07/2021  ? Chronic foot ulcer with necrosis of muscle, left ([place]) 10/07/2021  ? Left foot infection 09/20/2021  ? HTN (hypertension) 09/20/2021  ? Diabetes mellitus without complication ([place]) 22/48/2500  ? Chronic kidney disease, stage 3a ([place]) 09/20/2021  ? History of CVA (cerebrovascular accident) 09/20/2021  ? Depression with anxiety 09/20/2021  ? CAD (coronary artery disease) 09/20/2021  ? Breast cancer ([place])  09/20/2021  ? Diabetic infection of left foot ([place]) 09/20/2021  ? Cellulitis of lower extremity 09/20/2021  ? Hematoma 07/13/2021  ? MRSA infection 07/09/2021  ? Abscess 07/09/2021  ? Cellulitis of finger of left  hand 06/23/2021  ? Folliculitis 25/11/3974  ? Headache disorder 03/08/2021  ? Dysuria 03/05/2021  ? Pressure sore 01/29/2021  ? Hyperlipidemia associated with type 2 diabetes mellitus ([place]) 01/13/2021  ? Fatigue 12/01/2020  ? Acute on chronic diastolic heart failure ([place]) 11/16/2020  ? Chronic migraine without aura without status migrainosus, not intractable 10/01/2020  ? Dizziness 10/01/2020  ? Microscopic hematuria 07/01/2020  ? Pulmonary nodules 07/01/2020  ? Hav (hallux abducto valgus), unspecified laterality 06/11/2020  ? Venous ulcer of ankle, left ([place]) 05/15/2020  ? Contracture of hand joint 05/15/2020  ? Acute kidney injury superimposed on CKD ([place])   ? Hyperlipidemia   ? Carcinoma of lower-outer quadrant of right breast in female, estrogen receptor positive ([place]) 11/12/2019  ? Restrictive lung disease 10/08/2019  ? Postmenopausal bleeding 08/30/2019  ? Carotid stenosis, asymptomatic, right 08/16/2019  ? Nausea and vomiting 07/15/2019  ? Cellulitis of left lower extremity 06/04/2019  ? Multiple wounds of skin 06/04/2019  ? Other injury of unspecified body region, initial encounter 06/04/2019  ? Left-sided weakness 09/13/2018  ? Weakness of left lower extremity 09/05/2018  ? Recurrent falls 09/05/2018  ? PAD (peripheral artery disease) ([place]) 08/27/2018  ? Chronic congestive heart failure ([place]) 08/02/2018  ? Vaginal itching 06/15/2018  ? Chronic upper extremity pain (Secondary Area of Pain) (Bilateral) 04/23/2018  ? Diabetic polyneuropathy associated with type 2 diabetes mellitus ([place]) 04/23/2018  ? Long term prescription benzodiazepine use 04/23/2018  ? Neurogenic pain 04/23/2018  ? Vitamin D insufficiency 01/29/2018  ? Uncontrolled type 2 diabetes mellitus with hyperglycemia, with long-term current use of insulin ([place]) 01/23/2018  ? DNR (do not resuscitate) 01/23/2018  ? CKD stage 3 due to type 2 diabetes mellitus ([place]) 01/23/2018  ? IBS (irritable bowel syndrome) 01/19/2018  ? Chronic lower extremity pain  (Primary Area of Pain) (Bilateral) 01/08/2018  ? Chronic low back pain [facility] Area of Pain) (Bilateral) w/ sciatica (Bilateral) 01/08/2018  ? Chronic pain syndrome 01/08/2018  ? Pharmacologic therapy 01/08/2018  ? Disorder of skeletal system 01/08/2018  ? Problems influencing health status 01/08/2018  ? Long term current use of opiate analgesic 01/08/2018  ? Restless leg syndrome 08/02/2017  ? Nocturnal hypoxemia 03/29/2017  ? Vision disturbance 02/28/2017  ? CKD (chronic kidney disease), stage II 02/28/2017  ? Visual loss, bilateral   ? Chronic respiratory failure with hypoxia (HCC)/ nocturnal 02 dep  10/28/2016  ? Upper airway cough syndrome 08/22/2016  ? Cigarette smoker 06/15/2016  ? Simple chronic bronchitis ([place])   ? Coronary artery disease involving native heart without angina pectoris 05/16/2016  ? Chronic diastolic CHF (congestive heart failure) ([place]) 11/04/2015  ? Orthopnea 11/03/2015  ? Bilateral leg edema   ? Diabetes ([place])   ? COPD exacerbation ([place]) 10/15/2015  ? Fracture of rib, closed 10/15/2015  ? Venous stasis of both lower extremities 03/29/2015  ? Preventative health care 02/04/2015  ? Hypertension associated with diabetes ([place]) 01/02/2015  ? Inguinal hernia 11/27/2014  ? Allergic rhinitis with postnasal drip 01/21/2014  ? Aortic valve disorders 04/18/2013  ? Sleep apnea 05/22/2012  ? Syncope and collapse 10/09/2011  ? Seizure disorder ([place]) 10/09/2011  ? Bipolar disorder ([place]) 10/09/2011  ? TIA (transient ischemic attack) 06/22/2011  ? Constipation 06/15/2011  ? HYPERTRIGLYCERIDEMIA 07/17/2007  ? Situational anxiety  05/30/2007  ? COPD with chronic bronchitis ([place]) 05/30/2007  ? Morbid (severe) obesity due to excess calories ([place]) 04/23/2007  ? Tobacco abuse 09/07/2006  ? ?PCP:  Pleas Koch, NP ?Pharmacy:   ?[facility] - [place], [place] - [address], [address] ?[address], [address] ?[place] [address] ?Phone: [phone] Fax: [phone] ? ?[place], [place] ?[place] ?[place] [address] ?Phone: [phone] Fax: [phone] ? ?[place], [place]

## 2021-10-10 NOTE — Plan of Care (Signed)
?  Problem: Clinical Measurements: ?Goal: Ability to avoid or minimize complications of infection will improve ?Outcome: Progressing ?  ?Problem: Skin Integrity: ?Goal: Skin integrity will improve ?Outcome: Progressing ?  ?

## 2021-10-10 NOTE — H&P (View-Only) (Signed)
?[facility] ?Vascular Consult Note ? ?MRN : [id number] ? ?[name] is a 51 y.o. (1970-09-28) female who presents with chief complaint of  ?Chief Complaint  ?Patient presents with  ? Weakness  ?. ? ?History of Present Illness: Patient known to service- recent LEFT lower extremity angio on 09/22/2021 with evidence of small vessel pathology of the foot. Chronic LEFT foot wound with progressive gangrene. Recent extensive debridements of wound. PMH- HTN, DM, COPD. Presented with worsening foot wound and sepsis. At this juncture no further debridement options for foot viability and healing. Request to evaluate for LEFT BKA. ? ?Current Facility-Administered Medications  ?Medication Dose Route Frequency Provider Last Rate Last Admin  ? acetaminophen (TYLENOL) tablet 650 mg  650 mg Oral Q4H PRN Agbata, Tochukwu, MD      ? albuterol (PROVENTIL) (2.5 MG/3ML) 0.083% nebulizer solution 3 mL  3 mL Nebulization Q6H PRN Agbata, Tochukwu, MD      ? ALPRAZolam Duanne Moron) tablet 1 mg  1 mg Oral QID PRN Agbata, Tochukwu, MD   1 mg at 10/09/21 2228  ? alum & mag hydroxide-simeth (MAALOX/MYLANTA) 200-200-20 MG/5ML suspension 30 mL  30 mL Oral Q4H PRN Dwyane Dee, MD   30 mL at 10/09/21 2228  ? anastrozole (ARIMIDEX) tablet 1 mg  1 mg Oral Daily Agbata, Tochukwu, MD   1 mg at 10/09/21 0836  ? aspirin EC tablet 81 mg  81 mg Oral QAC breakfast Agbata, Tochukwu, MD   81 mg at 10/09/21 0830  ? busPIRone (BUSPAR) tablet 30 mg  30 mg Oral BID Agbata, Tochukwu, MD   30 mg at 10/09/21 2214  ? citalopram (CELEXA) tablet 20 mg  20 mg Oral Daily Agbata, Tochukwu, MD   20 mg at 10/09/21 0829  ? desvenlafaxine (PRISTIQ) 24 hr tablet 100 mg  100 mg Oral Daily Agbata, Tochukwu, MD   100 mg at 10/09/21 0836  ? enoxaparin (LOVENOX) injection 52.5 mg  0.5 mg/kg Subcutaneous Q24H Agbata, Tochukwu, MD   52.5 mg at 10/09/21 2215  ? insulin aspart (novoLOG) injection 0-15 Units  0-15 Units Subcutaneous TID WC Agbata, Tochukwu, MD    15 Units at 10/09/21 1346  ? insulin glargine-yfgn (SEMGLEE) injection 30 Units  30 Units Subcutaneous Daily Dwyane Dee, MD      ? mometasone-formoterol [facility]) 100-5 MCG/ACT inhaler 2 puff  2 puff Inhalation BID Agbata, Tochukwu, MD   2 puff at 10/09/21 2216  ? nicotine (NICODERM CQ - dosed in mg/24 hours) patch 14 mg  14 mg Transdermal Daily Agbata, Tochukwu, MD   14 mg at 10/09/21 0850  ? ondansetron (ZOFRAN) tablet 4 mg  4 mg Oral Q6H PRN Agbata, Tochukwu, MD      ? Or  ? ondansetron (ZOFRAN) injection 4 mg  4 mg Intravenous Q6H PRN Agbata, Tochukwu, MD      ? oxyCODONE-acetaminophen (PERCOCET/ROXICET) 5-325 MG per tablet 1 tablet  1 tablet Oral Q6H PRN Agbata, Tochukwu, MD      ? pregabalin (LYRICA) capsule 300 mg  300 mg Oral BID Agbata, Tochukwu, MD   300 mg at 10/09/21 2214  ? rOPINIRole (REQUIP) tablet 1 mg  1 mg Oral QHS Agbata, Tochukwu, MD   1 mg at 10/09/21 2214  ? rosuvastatin (CRESTOR) tablet 20 mg  20 mg Oral QHS Agbata, Tochukwu, MD   20 mg at 10/09/21 2213  ? spironolactone (ALDACTONE) tablet 50 mg  50 mg Oral Daily Agbata, Tochukwu, MD   50 mg at 10/09/21 0830  ?  traZODone (DESYREL) tablet 200 mg  200 mg Oral QHS Agbata, Tochukwu, MD      ? vancomycin (VANCOREADY) IVPB 750 mg/150 mL  750 mg Intravenous Q12H Berton Mount, RPH   Stopped at 10/09/21 2342  ? ? ?Past Medical History:  ?Diagnosis Date  ? Anxiety   ? takes Prozac daily  ? Anxiety   ? Aortic valve calcification   ? Asthma   ? Advair and Spirva daily  ? Asthma   ? Bipolar disorder ([place])   ? Breast cancer, right breast ([place]) 12/23/2019  ? CAD (coronary artery disease)   ? a. LHC 11/2013 done for CP/fluid retention: mild disease in prox LAD, mild-mod disease in mRCA, EF 60% with normal LVEDP. b. Normal nuc 03/2016.  ? Cancer [facility])   ? Cellulitis and abscess of trunk 03/12/2020  ? CHF (congestive heart failure) ([place])   ? Chronic diastolic CHF (congestive heart failure) ([place])   ? Chronic heart failure with preserved ejection fraction  ([place]) 11/16/2018  ? CKD (chronic kidney disease), stage II   ? COPD (chronic obstructive pulmonary disease) ([place])   ? a. nocturnal O2.  ? COPD (chronic obstructive pulmonary disease) ([place])   ? Coronary artery disease   ? Decreased urine stream   ? Diabetes mellitus   ? Diabetes mellitus without complication ([place])   ? Dyspnea   ? Elevated C-reactive protein (CRP) 01/29/2018  ? Elevated sedimentation rate 01/29/2018  ? Elevated serum hCG 01/19/2018  ? Family history of adverse reaction to anesthesia   ? mom gets nauseated  ? Foot pain, bilateral 05/22/2018  ? GERD (gastroesophageal reflux disease)   ? takes Pepcid daily  ? History of blood clots   ? left leg 3-68yr ago  ? Hyperlipidemia   ? Hypertension   ? Hypertriglyceridemia   ? Inguinal hernia, left 01/2015  ? Muscle spasm   ? Open wound of genital labia   ? Peripheral neuropathy   ? RBBB   ? Seizures ([place]   ? Sepsis ([place] 01/19/2018  ? Sinus tachycardia   ? a. persistent since 2009.  ? Smokers' cough ([place]   ? Status post mastectomy, right 01/14/2020  ? Stroke ([facility] 1989  ? left sided weakness  ? TIA (transient ischemic attack)   ? Tobacco abuse   ? Vulvar abscess 01/23/2018  ? ? ?Past Surgical History:  ?Procedure Laterality Date  ? APPLICATION OF WOUND VAC Right 01/29/2020  ? Procedure: APPLICATION OF WOUND VAC;  Surgeon: RRonny Bacon MD;  Location: [facility];  Service: General;  Laterality: Right;  ? BREAST BIOPSY Right 11/06/2019  ? uKoreacore path pending venus clip  ? GRAFT APPLICATION  36/38/4665 ? Procedure: GRAFT APPLICATION;  Surgeon: EEdrick Kins DPM;  Location: [facility];  Service: Podiatry;;  ? HERNIA REPAIR Left   ? INCISION AND DRAINAGE Bilateral 09/21/2021  ? Procedure: INCISION AND DRAINAGE;  Surgeon: EEdrick Kins DPM;  Location: [facility];  Service: Podiatry;  Laterality: Bilateral;  ? INCISION AND DRAINAGE ABSCESS N/A 01/26/2018  ? Procedure: INCISION AND DEBRIDEMENT OF VULVAR NECROTIZING SOFT TISSUE INFECTION;  Surgeon: WGreer Pickerel MD;  [facility]  Service: General;  Laterality: N/A;  ? INCISION AND DRAINAGE ABSCESS N/A 07/21/2021  ? Procedure: INCISION AND DRAINAGE ABSCESS Scalp;  Surgeon: BRobert Bellow MD;  Location: [facility];  Service: General;  Laterality: N/A;  scalp and right abdomen  ? INCISION AND DRAINAGE PERIRECTAL ABSCESS N/A 01/22/2018  ? Procedure: IRRIGATION AND  DEBRIDEMENT LABIAL/VULVAR AREA;  Surgeon: Coralie Keens, MD;  Location: [place];  Service: General;  Laterality: N/A;  ? [place] N/A 01/29/2018  ? Procedure: IRRIGATION AND DEBRIDEMENT VULVA;  Surgeon: Excell Seltzer, MD;  Location: [place];  Service: General;  Laterality: N/A;  ? INGUINAL HERNIA REPAIR Left 04/08/2015  ? Procedure: OPEN LEFT INGUINAL HERNIA REPAIR WITH MESH;  Surgeon: Ralene Ok, MD;  Location: [place];  Service: General;  Laterality: Left;  ? INSERTION OF MESH Left 04/08/2015  ? Procedure: INSERTION OF MESH;  Surgeon: Ralene Ok, MD;  Location: [place];  Service: General;  Laterality: Left;  ? IRRIGATION AND DEBRIDEMENT ABSCESS N/A 07/21/2021  ? Procedure: IRRIGATION AND DEBRIDEMENT ABSCESS abdomen;  Surgeon: Robert Bellow, MD;  Location: [facility];  Service: General;  Laterality: N/A;  ? LAPAROSCOPY    ? Endometriosis  ? LEFT HEART CATHETERIZATION WITH CORONARY ANGIOGRAM N/A 12/05/2013  ? Procedure: LEFT HEART CATHETERIZATION WITH CORONARY ANGIOGRAM;  Surgeon: Jettie Booze, MD;  Location: [facility] C[facility] LAB;  Service: Cardiovascular;  Laterality: N/A;  ? LOWER EXTREMITY ANGIOGRAPHY Left 09/22/2021  ? Procedure: Lower Extremity Angiography;  Surgeon: Katha Cabal, MD;  Location: [facility] CV LAB;  Service: Cardiovascular;  Laterality: Left;  ? MASTECTOMY Right   ? right kidney drained    ? SIMPLE MASTECTOMY WITH AXILLARY SENTINEL NODE BIOPSY Right 12/23/2019  ? Procedure: Right SIMPLE MASTECTOMY WITH AXILLARY SENTINEL NODE BIOPSY;  Surgeon: Ronny Bacon, MD;  Location: [facility];  Service: General;   Laterality: Right;  ? TEE WITHOUT CARDIOVERSION N/A 11/28/2013  ? Procedure: TRANSESOPHAGEAL ECHOCARDIOGRAM (TEE);  Surgeon: Thayer Headings, MD;  Location: [facility];  Service: Cardiovascular;  Laterality:

## 2021-10-10 NOTE — Progress Notes (Signed)
[facility] Cardiology ? ?SUBJECTIVE: Patient laying flat in bed, denies chest pain or shortness of breath ? ? ?Vitals:  ? 10/09/21 2330 10/09/21 2330 10/10/21 0405 10/10/21 9485  ?BP: 133/65 133/65 119/63 139/63  ?Pulse: 95 95 90 90  ?Resp: '16 16 18 20  '$ ?Temp: 98.4 ?F (36.9 ?C) 98.4 ?F (36.9 ?C) 98.6 ?F (37 ?C) 98.6 ?F (37 ?C)  ?TempSrc:      ?SpO2: 97% 98% 99% 95%  ?Weight:      ?Height:      ? ? ? ?Intake/Output Summary (Last 24 hours) at 10/10/2021 1008 ?Last data filed at 10/10/2021 0555 ?Gross per 24 hour  ?Intake 300 ml  ?Output 2550 ml  ?Net -2250 ml  ? ? ? ? ?PHYSICAL EXAM ? ?General: Well developed, well nourished, in no acute distress ?HEENT:  Normocephalic and atramatic ?Neck:  No JVD.  ?Lungs: Clear bilaterally to auscultation and percussion. ?Heart: HRRR . Normal S1 and S2 without gallops or murmurs.  ?Abdomen: Bowel sounds are positive, abdomen soft and non-tender  ?Msk:  Back normal, normal gait. Normal strength and tone for age. ?Extremities: No clubbing, cyanosis or edema.   ?Neuro: Alert and oriented X 3. ?Psych:  Good affect, responds appropriately ? ? ?LABS: ?Basic Metabolic Panel: ?Recent Labs  ?  10/09/21 ?0416 10/10/21 ?0338  ?NA 139 135  ?K 4.6 4.5  ?CL 102 99  ?CO2 23 27  ?GLUCOSE 398* 362*  ?BUN 42* 46*  ?CREATININE 1.21* 1.01*  ?CALCIUM 8.6* 8.5*  ?MG 2.7* 2.5*  ? ?Liver Function Tests: ?No results for input(s): AST, ALT, ALKPHOS, BILITOT, PROT, ALBUMIN in the last 72 hours. ?No results for input(s): LIPASE, AMYLASE in the last 72 hours. ?CBC: ?Recent Labs  ?  10/09/21 ?0416 10/10/21 ?0338  ?WBC 18.3* 14.5*  ?NEUTROABS 16.7* 12.9*  ?HGB 9.7* 10.0*  ?HCT 30.9* 32.4*  ?MCV 84.4 82.9  ?PLT 305 343  ? ?Cardiac Enzymes: ?No results for input(s): CKTOTAL, CKMB, CKMBINDEX, TROPONINI in the last 72 hours. ?BNP: ?Invalid input(s): POCBNP ?D-Dimer: ?No results for input(s): DDIMER in the last 72 hours. ?Hemoglobin A1C: ?No results for input(s): HGBA1C in the last 72 hours. ?Fasting Lipid Panel: ?No results for  input(s): CHOL, HDL, LDLCALC, TRIG, CHOLHDL, LDLDIRECT in the last 72 hours. ?Thyroid Function Tests: ?No results for input(s): TSH, T4TOTAL, T3FREE, THYROIDAB in the last 72 hours. ? ?Invalid input(s): FREET3 ?Anemia Panel: ?No results for input(s): VITAMINB12, FOLATE, FERRITIN, TIBC, IRON, RETICCTPCT in the last 72 hours. ? ?ECHOCARDIOGRAM COMPLETE ? ?Result Date: 10/09/2021 ?   ECHOCARDIOGRAM REPORT   Patient Name:   [name] Date of Exam: 10/09/2021 Medical Rec #:  [id number]     Height:       64.0 in Accession #:    [id number]    Weight:       230.0 lb Date of Birth:  Aug 13, 1970     BSA:          2.076 m? Patient Age:    51 years      BP:           113/72 mmHg Patient Gender: F             HR:           76 bpm. Exam Location:  [facility] Procedure: 2D Echo, Cardiac Doppler and Color Doppler Indications:     Bacteremia R78.81  History:         Patient has prior history of Echocardiogram examinations, most  recent 09/14/2018. COPD and PAD.  Sonographer:     Luane School RDCS Referring Phys:  Arnold Diagnosing Phys: Isaias Cowman MD IMPRESSIONS  1. Left ventricular ejection fraction, by estimation, is 60 to 65%. The left ventricle has normal function. The left ventricle has no regional wall motion abnormalities. Left ventricular diastolic parameters were normal.  2. Right ventricular systolic function is normal. The right ventricular size is normal.  3. The mitral valve is normal in structure. Mild mitral valve regurgitation. No evidence of mitral stenosis.  4. The aortic valve is normal in structure. Aortic valve regurgitation is mild. No aortic stenosis is present.  5. The inferior vena cava is normal in size with greater than 50% respiratory variability, suggesting right atrial pressure of 3 mmHg. Conclusion(s)/Recommendation(s): No evidence of valvular vegetations on this transthoracic echocardiogram. Consider a transesophageal echocardiogram to exclude infective endocarditis if  clinically indicated. FINDINGS  Left Ventricle: Left ventricular ejection fraction, by estimation, is 60 to 65%. The left ventricle has normal function. The left ventricle has no regional wall motion abnormalities. The left ventricular internal cavity size was normal in size. There is  no left ventricular hypertrophy. Left ventricular diastolic parameters were normal. Right Ventricle: The right ventricular size is normal. No increase in right ventricular wall thickness. Right ventricular systolic function is normal. Left Atrium: Left atrial size was normal in size. Right Atrium: Right atrial size was normal in size. Pericardium: There is no evidence of pericardial effusion. Mitral Valve: The mitral valve is normal in structure. Mild mitral valve regurgitation. No evidence of mitral valve stenosis. Tricuspid Valve: The tricuspid valve is normal in structure. Tricuspid valve regurgitation is mild . No evidence of tricuspid stenosis. Aortic Valve: The aortic valve is normal in structure. Aortic valve regurgitation is mild. No aortic stenosis is present. Pulmonic Valve: The pulmonic valve was normal in structure. Pulmonic valve regurgitation is not visualized. No evidence of pulmonic stenosis. Aorta: The aortic root is normal in size and structure. Venous: The inferior vena cava is normal in size with greater than 50% respiratory variability, suggesting right atrial pressure of 3 mmHg. IAS/Shunts: No atrial level shunt detected by color flow Doppler.  LEFT VENTRICLE PLAX 2D LVIDd:         4.50 cm   Diastology LVIDs:         3.06 cm   LV e' medial:    6.74 cm/s LV PW:         1.12 cm   LV E/e' medial:  20.5 LV IVS:        1.07 cm   LV e' lateral:   11.30 cm/s LVOT diam:     1.80 cm   LV E/e' lateral: 12.2 LV SV:         57 LV SV Index:   27 LVOT Area:     2.54 cm?  RIGHT VENTRICLE RV S prime:     11.30 cm/s TAPSE (M-mode): 1.6 cm LEFT ATRIUM             Index        RIGHT ATRIUM           Index LA diam:        4.70 cm  2.26 cm/m?   RA Area:     17.00 cm? LA Vol (A2C):   48.3 ml 23.26 ml/m?  RA Volume:   40.60 ml  19.56 ml/m? LA Vol (A4C):   44.0 ml 21.19 ml/m? LA Biplane Vol: 46.2 ml  22.25 ml/m?  AORTIC VALVE LVOT Vmax:   90.50 cm/s LVOT Vmean:  65.800 cm/s LVOT VTI:    0.224 m  AORTA Ao Root diam: 2.80 cm Ao Asc diam:  2.90 cm MITRAL VALVE                TRICUSPID VALVE MV Area (PHT): 4.86 cm?     TR Peak grad:   13.7 mmHg MV Decel Time: 156 msec     TR Vmax:        185.00 cm/s MV E velocity: 138.00 cm/s MV A velocity: 47.60 cm/s   SHUNTS MV E/A ratio:  2.90         Systemic VTI:  0.22 m                             Systemic Diam: 1.80 cm Isaias Cowman MD Electronically signed by Isaias Cowman MD Signature Date/Time: 10/09/2021/11:50:14 AM    Final    ? ? ?Echo LVEF 60 to 65%, no evidence for valvular vegetation 10/08/2021 ? ?TELEMETRY: : ? ?ASSESSMENT AND PLAN: ? ?Principal Problem: ?  Cellulitis of left lower extremity ?Active Problems: ?  Morbid (severe) obesity due to excess calories ([place]) ?  Tobacco abuse ?  COPD with chronic bronchitis ([place]) ?  Bipolar disorder ([place]) ?  Chronic diastolic CHF (congestive heart failure) ([place]) ?  Uncontrolled type 2 diabetes mellitus with hyperglycemia, with long-term current use of insulin ([place]) ?  PAD (peripheral artery disease) ([place]) ?  Chronic kidney disease, stage 3a ([place]) ?  CAD (coronary artery disease) ?  Breast cancer ([place]) ?  Hyponatremia ?  Chronic foot ulcer with necrosis of muscle, left ([place]) ?  Bacteremia ?  Allergic reaction ?  ? ?1.  Left lower extremity cellulitis, necrotic nonhealing wound left foot, MRSA bacteremia, 2D echocardiogram revealed normal left ventricular function, without evidence for valvular vegetation, was scheduled for TEE in a.m., however, patient was seen by vascular surgery who feels strongly that nonhealing left foot wound is the source of bacteremia, requires left BKA, to be performed by Dr. Lucky Cowboy on 10/11/2021 or 10/12/2021 and does not feel TEE is  necessary at this time ?2.  Chronic diastolic congestive heart failure, HFpEF, EF 60-65% by 2D echocardiogram 10/09/2021 BNP 88 on 09/20/2021, currently appears euvolemic ?3.  Insignificant CAD by cardiac ca

## 2021-10-10 NOTE — Progress Notes (Addendum)
Pharmacy Antibiotic Note ? ?[name] is a 51 y.o. female admitted on 10/07/2021 with bacteremia.  Pharmacy has been consulted for vancomycin dosing. Patient has chronic lower extremity wounds w/ cellulitis noted on L leg. She is s/p I&D of BL ulcers on 3/14 (no cultures taken).  She was discharged on clindamycin. Abscess culture from January grew MRSA, appears has I&D of scalp and abdominal wall abscesses at that time. ID following. ? ?Plan: ?Will adjust vancomycin to 1000 mg IV Q24H based on peak and trough levels ?New estimated AUC: 515 ?Goal AUC 400-600 ?Follow-up blood cultures, repeat blood cultures, TTE and possibily TEE if deemed indicated ? ? ?Height: '5\' 4"'$  (162.6 cm) ?Weight: 104.3 kg (230 lb) ?IBW/kg (Calculated) : 54.7 ? ?Temp (24hrs), Avg:98.9 ?F (37.2 ?C), Min:98.4 ?F (36.9 ?C), Max:99.8 ?F (37.7 ?C) ? ?Recent Labs  ?Lab 10/07/21 ?1347 10/07/21 ?1511 10/08/21 ?1655 10/09/21 ?3748 10/10/21 ?2707 10/10/21 ?1148 10/10/21 ?2105  ?WBC 17.9*  --  16.9* 18.3* 14.5*  --   --   ?CREATININE 1.23*  --  1.04* 1.21* 1.01*  --   --   ?LATICACIDVEN  --  1.8  --   --   --   --   --   ?[place]  --   --   --   --   --   --  25*  ?VANCOPEAK  --   --   --   --   --  47*  --   ? ?  ?Estimated Creatinine Clearance: 76.6 mL/min (A) (by C-G formula based on SCr of 1.01 mg/dL (H)).   ? ?Allergies  ?Allergen Reactions  ? Adhesive [Tape] Rash and Other (See Comments)  ?  TAKES OFF THE SKIN (CERTAIN MEDICAL TAPES DO THIS!!)  ? Metoprolol Shortness Of Breath  ?  Occurrence of shortness of breath after 3 days  ? Montelukast Shortness Of Breath  ? Morphine Sulfate Anaphylaxis, Shortness Of Breath and Nausea And Vomiting  ?  Swollen Throat - Able to tolerate dilaudid  ? Penicillins Anaphylaxis, Hives and Shortness Of Breath  ?  Throat swells ?Has patient had a PCN reaction causing immediate rash, facial/tongue/throat swelling, SOB or lightheadedness with hypotension: Yes ?Has patient had a PCN reaction causing severe rash  involving mucus membranes or skin necrosis: No ?Has patient had a PCN reaction that required hospitalization: Yes ?Has patient had a PCN reaction occurring within the last 10 years: No ?If all of the above answers are "NO", then may proceed with Cephalosporin use. ?  ? Silicone Rash  ?  Takes off the skin (certain medical tapes do this)  ? Diltiazem Swelling  ? Doxycycline Hives  ? Gabapentin Swelling  ? Midodrine   ?  Lightheaded and falling down  ? Percocet [Oxycodone-Acetaminophen] Rash  ? ? ?Antimicrobials this admission: ?Ceftriaxone 3/30 >> ?Metronidazole 3/31 >> ?Vancomycin 3/30 >> ? ?Dose adjustments this admission: ? ? ?Microbiology results: ?3/30 BCx: GPC in 2 of 4 bottles, BCID = MRSA ?4/01 BCx: NG < 24H ?3/30 UCx: multiple species ? ?Thank you for allowing pharmacy to be a part of this patient?s care. ? ?Sherilyn Banker, PharmD ?Clinical Pharmacist  ?10/10/2021 9:41 PM  ? ? ? ? ?

## 2021-10-10 NOTE — Progress Notes (Signed)
?Progress Note ? ? ? ?[name]   ?KGM:[id number]  ?DOB: 05/11/1971  ?DOA: 10/07/2021     2 ?PCP: Pleas Koch, NP ? ?Initial CC: left foot wound ? ?Hospital Course: ?[name] is a 51 yo female with PMH PVD, HTN, HLD, DMII, COPD, asthma, CVA, GERD, depression/anxiety, tobacco abuse, seizure d/o, anemia, CAD, CKD3a, dCHF, right breast cancer (s/p mastectomy) who presented with worsening pain and swelling in her left foot and a nonhealing wound. ? ?She was recently hospitalized from 09/20/2021 until 09/23/2021 for a diabetic infection involving her left foot and she had undergone debridement on 09/21/2021 with podiatry in the OR.  She also had vascular surgery evaluation with abdominal aortogram and left lower extremity angiogram (no significant stenosis/no intervention required).  ? ?She had been healing fairly well at home until a few days prior to admission when she developed redness, pain, swelling of her left leg.  She also has had an ongoing diffuse rash presumed a drug rash possibly from clindamycin at discharge.  She remains a poor historian and was unable to elaborate on her medications once again and whether or not the rash preceded starting clindamycin. ?She was started on antibiotics and podiatry was also consulted. ? ?Interval History:  ?No events overnight.  Husband present bedside this morning.  They met with podiatry last night and vascular surgery this morning.  Patient is amenable for left BKA for definitive treatment of her wound.  She understands probable need for rehab after discharge as well. ? ?Assessment and Plan: ?* Cellulitis of left lower extremity ?- Necrotic wound on dorsal foot with purulent drainage appreciated on plantar surface.  Foul-smelling ?-Per ID, infection felt to be slowly contributed to MRSA.  Rocephin and Flagyl have been discontinued.  Remains on monotherapy vancomycin ?-Appreciate podiatry evaluation.  Concern is for healing of wound especially if further  debridement.  Vascular consultation obtained and recommendation made for left BKA, patient also amenable to this.  Tentative plan for surgery this coming week ?-She will likely require SNF after hospital discharge as well ? ?Bacteremia ?- Blood cultures from admission positive for 2/4 bottles (1 set only) of GPC which speciated to MRSA.  Etiology would be considered contamination versus infectious from foot wound (hygeine is questionable and true bacteremia is a practical consideration) ?- ID does believe bacteremia is true ?- TTE is negative for vegetation; TEE currently canceled while BKA work-up commenced.  Not sure if will still need TEE to rule out underlying vegetation ?- continue vanc per pharmacy ?-ID following  ? ?Uncontrolled type 2 diabetes mellitus with hyperglycemia, with long-term current use of insulin ([facility]) ?- A1c 10.1% ?- Patient had difficulty with her regimen last hospitalization and questionable compliance ?- Continue on SSI and Semglee.  Will adjust as necessary ?- Continue CBG monitoring ? ?Allergic reaction ?- Drug rash appreciated on abdomen, lower chest, and thighs.  Patient has been taking Benadryl at home with minimal improvement.  She denies pruritus.  Possible reaction to previously prescribed clindamycin was about her only new medication.  She had a similar rash but smaller last hospitalization after having been on doxycycline ?-For now treating with Benadryl, Pepcid, steroid.  Rash to diffuse for cream at this time ? ?PAD (peripheral artery disease) ([place]) ?- underwent angiogram of abdomen and left lower extremity on 09/22/2021 with no significant stenosis nor stenting required ?-Continue aspirin and Crestor ?-See cellulitis above.  Tentative plan is now for left BKA due to concern for healing of foot wound ? ?  Chronic diastolic CHF (congestive heart failure) ([place]) ?- Stable, no S/S exacerbation ?- Continue spironolactone and aspirin ? ?Hyponatremia-resolved as of 10/10/2021 ?- Now  resolved ? ?Chronic foot ulcer with necrosis of muscle, left ([place]) ?- See cellulitis ? ?Breast cancer ([place]) ?- Continue anastrozole ?- s/p right mastectomy  ? ?CAD (coronary artery disease) ?Continue aspirin and Crestor ? ?Chronic kidney disease, stage 3a ([place]) ?- patient has history of CKD3a. Baseline creat ~ 1 - 1.1, eGFR 50closely ? ?Bipolar disorder ([place]) ?Continue trazodone, Pristiq, buspirone and Celexa ? ?COPD with chronic bronchitis ([place]) ?Not acutely exacerbated ?Continue as needed bronchodilator therapy as well as inhaled steroids ? ?Tobacco abuse ?Smoking cessation discussed with patient ?-Continue nicotine patch ? ?Morbid (severe) obesity due to excess calories ([place]) ?Complicates overall prognosis and care ?Lifestyle modification and exercise has been discussed with patient in detail ? ? ? ?Old records reviewed in assessment of this patient ? ?Antimicrobials: ?Rocephin 10/07/2021 >> 10/08/2021 ?Vancomycin 10/07/2021 >> current ?Flagyl 10/08/2021 >> 10/08/2021 ? ?DVT prophylaxis:  ?  Lovenox ? ? ?Code Status:   Code Status: Full Code ? ?Disposition Plan: Probable SNF ?Status is: Inpt ? ?Objective: ?Blood pressure 139/63, pulse 90, temperature 98.6 ?F (37 ?C), resp. rate 20, height 5' 4"  (1.626 m), weight 104.3 kg, SpO2 95 %.  ?Examination:  ?Physical Exam ?Constitutional:   ?   General: She is not in acute distress. ?   Appearance: Normal appearance.  ?HENT:  ?   Head: Normocephalic and atraumatic.  ?   Mouth/Throat:  ?   Mouth: Mucous membranes are moist.  ?Eyes:  ?   Extraocular Movements: Extraocular movements intact.  ?Cardiovascular:  ?   Rate and Rhythm: Normal rate and regular rhythm.  ?   Comments: 2/6 suspected diastolic murmur/click best noted in LUSB and LLSB ?Pulmonary:  ?   Effort: Pulmonary effort is normal.  ?   Breath sounds: Normal breath sounds.  ?Abdominal:  ?   General: Bowel sounds are normal. There is no distension.  ?   Palpations: Abdomen is soft.  ?   Tenderness: There is no abdominal  tenderness.  ?Musculoskeletal:  ?   Cervical back: Normal range of motion and neck supple.  ?   Comments: Left foot with large necrotic dry wound on dorsal surface; approx 3 cm diameter blister on plantar surface near toes draining foul smelling pus; left leg dense erythema with calor and mild dolor   ?Neurological:  ?   Mental Status: She is alert.  ?  ?Pic from 10/08/21: ? ? ? ? ?Consultants:  ?Podiatry ? ?Procedures:  ? ? ?Data Reviewed: ?Results for orders placed or performed during the hospital encounter of 10/07/21 (from the past 24 hour(s))  ?Glucose, capillary     Status: Abnormal  ? Collection Time: 10/09/21 12:20 PM  ?Result Value Ref Range  ? Glucose-Capillary 423 (H) 70 - 99 mg/dL  ?Glucose, capillary     Status: Abnormal  ? Collection Time: 10/09/21  9:35 PM  ?Result Value Ref Range  ? Glucose-Capillary 433 (H) 70 - 99 mg/dL  ?Glucose, capillary     Status: Abnormal  ? Collection Time: 10/10/21  3:02 AM  ?Result Value Ref Range  ? Glucose-Capillary 341 (H) 70 - 99 mg/dL  ? Comment 1 Notify RN   ? Comment 2 Document in Chart   ?Basic metabolic panel     Status: Abnormal  ? Collection Time: 10/10/21  3:38 AM  ?Result Value Ref Range  ? Sodium 135 135 -  145 mmol/L  ? Potassium 4.5 3.5 - 5.1 mmol/L  ? Chloride 99 98 - 111 mmol/L  ? CO2 27 22 - 32 mmol/L  ? Glucose, Bld 362 (H) 70 - 99 mg/dL  ? BUN 46 (H) 6 - 20 mg/dL  ? Creatinine, Ser 1.01 (H) 0.44 - 1.00 mg/dL  ? Calcium 8.5 (L) 8.9 - 10.3 mg/dL  ? GFR, Estimated >60 >60 mL/min  ? Anion gap 9 5 - 15  ?CBC with Differential/Platelet     Status: Abnormal  ? Collection Time: 10/10/21  3:38 AM  ?Result Value Ref Range  ? WBC 14.5 (H) 4.0 - 10.5 K/uL  ? RBC 3.91 3.87 - 5.11 MIL/uL  ? Hemoglobin 10.0 (L) 12.0 - 15.0 g/dL  ? HCT 32.4 (L) 36.0 - 46.0 %  ? MCV 82.9 80.0 - 100.0 fL  ? MCH 25.6 (L) 26.0 - 34.0 pg  ? MCHC 30.9 30.0 - 36.0 g/dL  ? RDW 15.8 (H) 11.5 - 15.5 %  ? Platelets 343 150 - 400 K/uL  ? nRBC 0.0 0.0 - 0.2 %  ? Neutrophils Relative % 90 %  ? Neutro  Abs 12.9 (H) 1.7 - 7.7 K/uL  ? Lymphocytes Relative 4 %  ? Lymphs Abs 0.6 (L) 0.7 - 4.0 K/uL  ? Monocytes Relative 4 %  ? Monocytes Absolute 0.6 0.1 - 1.0 K/uL  ? Eosinophils Relative 1 %  ? Eosinophils Absolute 0.2 0.

## 2021-10-10 NOTE — Consult Note (Signed)
?[facility] ?Vascular Consult Note ? ?MRN : [id number] ? ?[name] is a 51 y.o. (1970-12-02) female who presents with chief complaint of  ?Chief Complaint  ?Patient presents with  ? Weakness  ?. ? ?History of Present Illness: Patient known to service- recent LEFT lower extremity angio on 09/22/2021 with evidence of small vessel pathology of the foot. Chronic LEFT foot wound with progressive gangrene. Recent extensive debridements of wound. PMH- HTN, DM, COPD. Presented with worsening foot wound and sepsis. At this juncture no further debridement options for foot viability and healing. Request to evaluate for LEFT BKA. ? ?Current Facility-Administered Medications  ?Medication Dose Route Frequency Provider Last Rate Last Admin  ? acetaminophen (TYLENOL) tablet 650 mg  650 mg Oral Q4H PRN Agbata, Tochukwu, MD      ? albuterol (PROVENTIL) (2.5 MG/3ML) 0.083% nebulizer solution 3 mL  3 mL Nebulization Q6H PRN Agbata, Tochukwu, MD      ? ALPRAZolam Duanne Moron) tablet 1 mg  1 mg Oral QID PRN Agbata, Tochukwu, MD   1 mg at 10/09/21 2228  ? alum & mag hydroxide-simeth (MAALOX/MYLANTA) 200-200-20 MG/5ML suspension 30 mL  30 mL Oral Q4H PRN Dwyane Dee, MD   30 mL at 10/09/21 2228  ? anastrozole (ARIMIDEX) tablet 1 mg  1 mg Oral Daily Agbata, Tochukwu, MD   1 mg at 10/09/21 0836  ? aspirin EC tablet 81 mg  81 mg Oral QAC breakfast Agbata, Tochukwu, MD   81 mg at 10/09/21 0830  ? busPIRone (BUSPAR) tablet 30 mg  30 mg Oral BID Agbata, Tochukwu, MD   30 mg at 10/09/21 2214  ? citalopram (CELEXA) tablet 20 mg  20 mg Oral Daily Agbata, Tochukwu, MD   20 mg at 10/09/21 0829  ? desvenlafaxine (PRISTIQ) 24 hr tablet 100 mg  100 mg Oral Daily Agbata, Tochukwu, MD   100 mg at 10/09/21 0836  ? enoxaparin (LOVENOX) injection 52.5 mg  0.5 mg/kg Subcutaneous Q24H Agbata, Tochukwu, MD   52.5 mg at 10/09/21 2215  ? insulin aspart (novoLOG) injection 0-15 Units  0-15 Units Subcutaneous TID WC Agbata, Tochukwu, MD    15 Units at 10/09/21 1346  ? insulin glargine-yfgn (SEMGLEE) injection 30 Units  30 Units Subcutaneous Daily Dwyane Dee, MD      ? mometasone-formoterol [facility]) 100-5 MCG/ACT inhaler 2 puff  2 puff Inhalation BID Agbata, Tochukwu, MD   2 puff at 10/09/21 2216  ? nicotine (NICODERM CQ - dosed in mg/24 hours) patch 14 mg  14 mg Transdermal Daily Agbata, Tochukwu, MD   14 mg at 10/09/21 0850  ? ondansetron (ZOFRAN) tablet 4 mg  4 mg Oral Q6H PRN Agbata, Tochukwu, MD      ? Or  ? ondansetron (ZOFRAN) injection 4 mg  4 mg Intravenous Q6H PRN Agbata, Tochukwu, MD      ? oxyCODONE-acetaminophen (PERCOCET/ROXICET) 5-325 MG per tablet 1 tablet  1 tablet Oral Q6H PRN Agbata, Tochukwu, MD      ? pregabalin (LYRICA) capsule 300 mg  300 mg Oral BID Agbata, Tochukwu, MD   300 mg at 10/09/21 2214  ? rOPINIRole (REQUIP) tablet 1 mg  1 mg Oral QHS Agbata, Tochukwu, MD   1 mg at 10/09/21 2214  ? rosuvastatin (CRESTOR) tablet 20 mg  20 mg Oral QHS Agbata, Tochukwu, MD   20 mg at 10/09/21 2213  ? spironolactone (ALDACTONE) tablet 50 mg  50 mg Oral Daily Agbata, Tochukwu, MD   50 mg at 10/09/21 0830  ?  traZODone (DESYREL) tablet 200 mg  200 mg Oral QHS Agbata, Tochukwu, MD      ? vancomycin (VANCOREADY) IVPB 750 mg/150 mL  750 mg Intravenous Q12H Berton Mount, RPH   Stopped at 10/09/21 2342  ? ? ?Past Medical History:  ?Diagnosis Date  ? Anxiety   ? takes Prozac daily  ? Anxiety   ? Aortic valve calcification   ? Asthma   ? Advair and Spirva daily  ? Asthma   ? Bipolar disorder ([place])   ? Breast cancer, right breast ([place]) 12/23/2019  ? CAD (coronary artery disease)   ? a. LHC 11/2013 done for CP/fluid retention: mild disease in prox LAD, mild-mod disease in mRCA, EF 60% with normal LVEDP. b. Normal nuc 03/2016.  ? Cancer [facility])   ? Cellulitis and abscess of trunk 03/12/2020  ? CHF (congestive heart failure) ([place])   ? Chronic diastolic CHF (congestive heart failure) ([place])   ? Chronic heart failure with preserved ejection fraction  ([place]) 11/16/2018  ? CKD (chronic kidney disease), stage II   ? COPD (chronic obstructive pulmonary disease) ([place])   ? a. nocturnal O2.  ? COPD (chronic obstructive pulmonary disease) ([place])   ? Coronary artery disease   ? Decreased urine stream   ? Diabetes mellitus   ? Diabetes mellitus without complication ([place])   ? Dyspnea   ? Elevated C-reactive protein (CRP) 01/29/2018  ? Elevated sedimentation rate 01/29/2018  ? Elevated serum hCG 01/19/2018  ? Family history of adverse reaction to anesthesia   ? mom gets nauseated  ? Foot pain, bilateral 05/22/2018  ? GERD (gastroesophageal reflux disease)   ? takes Pepcid daily  ? History of blood clots   ? left leg 3-55yr ago  ? Hyperlipidemia   ? Hypertension   ? Hypertriglyceridemia   ? Inguinal hernia, left 01/2015  ? Muscle spasm   ? Open wound of genital labia   ? Peripheral neuropathy   ? RBBB   ? [place]   ? [place]is ([place] 01/19/2018  ? Sinus tachycardia   ? a. persistent since 2009.  ? Sm[place]' cough ([place]   ? Status post mastectomy, right 01/14/2020  ? Stroke ([facility] 1989  ? left sided weakness  ? TIA (transient ischemic attack)   ? Tobacco abuse   ? Vulvar abscess 01/23/2018  ? ? ?Past Surgical History:  ?Procedure Laterality Date  ? APPLICATION OF WOUND VAC Right 01/29/2020  ? Procedure: APPLICATION OF WOUND VAC;  Surgeon: RRonny Bacon MD;  Location: [facility];  Service: General;  Laterality: Right;  ? BREAST BIOPSY Right 11/06/2019  ? uKoreacore path pending venus clip  ? GRAFT APPLICATION  39/93/7169 ? Procedure: GRAFT APPLICATION;  Surgeon: EEdrick Kins DPM;  Location: [facility];  Service: Podiatry;;  ? HERNIA REPAIR Left   ? INCISION AND DRAINAGE Bilateral 09/21/2021  ? Procedure: INCISION AND DRAINAGE;  Surgeon: EEdrick Kins DPM;  Location: [facility];  Service: Podiatry;  Laterality: Bilateral;  ? INCISION AND DRAINAGE ABSCESS N/A 01/26/2018  ? Procedure: INCISION AND DEBRIDEMENT OF VULVAR NECROTIZING SOFT TISSUE INFECTION;  Surgeon: WGreer Pickerel MD;  [place]  Service: General;  Laterality: N/A;  ? INCISION AND DRAINAGE ABSCESS N/A 07/21/2021  ? Procedure: INCISION AND DRAINAGE ABSCESS Scalp;  Surgeon: BRobert Bellow MD;  Location: [facility];  Service: General;  Laterality: N/A;  scalp and right abdomen  ? INCISION AND DRAINAGE PERIRECTAL ABSCESS N/A 01/22/2018  ? Procedure: IRRIGATION AND  DEBRIDEMENT LABIAL/VULVAR AREA;  Surgeon: Coralie Keens, MD;  Location: [place];  Service: General;  Laterality: N/A;  ? [place] N/A 01/29/2018  ? Procedure: IRRIGATION AND DEBRIDEMENT VULVA;  Surgeon: Excell Seltzer, MD;  Location: [place];  Service: General;  Laterality: N/A;  ? INGUINAL HERNIA REPAIR Left 04/08/2015  ? Procedure: OPEN LEFT INGUINAL HERNIA REPAIR WITH MESH;  Surgeon: Ralene Ok, MD;  Location: [place];  Service: General;  Laterality: Left;  ? INSERTION OF MESH Left 04/08/2015  ? Procedure: INSERTION OF MESH;  Surgeon: Ralene Ok, MD;  Location: [place];  Service: General;  Laterality: Left;  ? IRRIGATION AND DEBRIDEMENT ABSCESS N/A 07/21/2021  ? Procedure: IRRIGATION AND DEBRIDEMENT ABSCESS abdomen;  Surgeon: Robert Bellow, MD;  Location: [facility];  Service: General;  Laterality: N/A;  ? LAPAROSCOPY    ? Endometriosis  ? LEFT HEART CATHETERIZATION WITH CORONARY ANGIOGRAM N/A 12/05/2013  ? Procedure: LEFT HEART CATHETERIZATION WITH CORONARY ANGIOGRAM;  Surgeon: Jettie Booze, MD;  Location: [facility] CATH LAB;  Service: Cardiovascular;  Laterality: N/A;  ? LOWER EXTREMITY ANGIOGRAPHY Left 09/22/2021  ? Procedure: Lower Extremity Angiography;  Surgeon: Katha Cabal, MD;  Location: [facility] CV LAB;  Service: Cardiovascular;  Laterality: Left;  ? MASTECTOMY Right   ? right kidney drained    ? SIMPLE MASTECTOMY WITH AXILLARY SENTINEL NODE BIOPSY Right 12/23/2019  ? Procedure: Right SIMPLE MASTECTOMY WITH AXILLARY SENTINEL NODE BIOPSY;  Surgeon: Ronny Bacon, MD;  Location: [facility];  Service: General;   Laterality: Right;  ? TEE WITHOUT CARDIOVERSION N/A 11/28/2013  ? Procedure: TRANSESOPHAGEAL ECHOCARDIOGRAM (TEE);  Surgeon: Thayer Headings, MD;  Location: [place];  Service: Cardiovascular;  Laterality:

## 2021-10-10 NOTE — H&P (View-Only) (Signed)
?[facility] ?Vascular Consult Note ? ?MRN : [id number] ? ?[name] is a 51 y.o. (11/21/1970) female who presents with chief complaint of  ?Chief Complaint  ?Patient presents with  ? Weakness  ?. ? ?History of Present Illness: Patient known to service- recent LEFT lower extremity angio on 09/22/2021 with evidence of small vessel pathology of the foot. Chronic LEFT foot wound with progressive gangrene. Recent extensive debridements of wound. PMH- HTN, DM, COPD. Presented with worsening foot wound and sepsis. At this juncture no further debridement options for foot viability and healing. Request to evaluate for LEFT BKA. ? ?Current Facility-Administered Medications  ?Medication Dose Route Frequency Provider Last Rate Last Admin  ? acetaminophen (TYLENOL) tablet 650 mg  650 mg Oral Q4H PRN Agbata, Tochukwu, MD      ? albuterol (PROVENTIL) (2.5 MG/3ML) 0.083% nebulizer solution 3 mL  3 mL Nebulization Q6H PRN Agbata, Tochukwu, MD      ? ALPRAZolam Duanne Moron) tablet 1 mg  1 mg Oral QID PRN Agbata, Tochukwu, MD   1 mg at 10/09/21 2228  ? alum & mag hydroxide-simeth (MAALOX/MYLANTA) 200-200-20 MG/5ML suspension 30 mL  30 mL Oral Q4H PRN Dwyane Dee, MD   30 mL at 10/09/21 2228  ? anastrozole (ARIMIDEX) tablet 1 mg  1 mg Oral Daily Agbata, Tochukwu, MD   1 mg at 10/09/21 0836  ? aspirin EC tablet 81 mg  81 mg Oral QAC breakfast Agbata, Tochukwu, MD   81 mg at 10/09/21 0830  ? busPIRone (BUSPAR) tablet 30 mg  30 mg Oral BID Agbata, Tochukwu, MD   30 mg at 10/09/21 2214  ? citalopram (CELEXA) tablet 20 mg  20 mg Oral Daily Agbata, Tochukwu, MD   20 mg at 10/09/21 0829  ? desvenlafaxine (PRISTIQ) 24 hr tablet 100 mg  100 mg Oral Daily Agbata, Tochukwu, MD   100 mg at 10/09/21 0836  ? enoxaparin (LOVENOX) injection 52.5 mg  0.5 mg/kg Subcutaneous Q24H Agbata, Tochukwu, MD   52.5 mg at 10/09/21 2215  ? insulin aspart (novoLOG) injection 0-15 Units  0-15 Units Subcutaneous TID WC Agbata, Tochukwu, MD    15 Units at 10/09/21 1346  ? insulin glargine-yfgn (SEMGLEE) injection 30 Units  30 Units Subcutaneous Daily Dwyane Dee, MD      ? mometasone-formoterol [facility]) 100-5 MCG/ACT inhaler 2 puff  2 puff Inhalation BID Agbata, Tochukwu, MD   2 puff at 10/09/21 2216  ? nicotine (NICODERM CQ - dosed in mg/24 hours) patch 14 mg  14 mg Transdermal Daily Agbata, Tochukwu, MD   14 mg at 10/09/21 0850  ? ondansetron (ZOFRAN) tablet 4 mg  4 mg Oral Q6H PRN Agbata, Tochukwu, MD      ? Or  ? ondansetron (ZOFRAN) injection 4 mg  4 mg Intravenous Q6H PRN Agbata, Tochukwu, MD      ? oxyCODONE-acetaminophen (PERCOCET/ROXICET) 5-325 MG per tablet 1 tablet  1 tablet Oral Q6H PRN Agbata, Tochukwu, MD      ? pregabalin (LYRICA) capsule 300 mg  300 mg Oral BID Agbata, Tochukwu, MD   300 mg at 10/09/21 2214  ? rOPINIRole (REQUIP) tablet 1 mg  1 mg Oral QHS Agbata, Tochukwu, MD   1 mg at 10/09/21 2214  ? rosuvastatin (CRESTOR) tablet 20 mg  20 mg Oral QHS Agbata, Tochukwu, MD   20 mg at 10/09/21 2213  ? spironolactone (ALDACTONE) tablet 50 mg  50 mg Oral Daily Agbata, Tochukwu, MD   50 mg at 10/09/21 0830  ?  traZODone (DESYREL) tablet 200 mg  200 mg Oral QHS Agbata, Tochukwu, MD      ? vancomycin (VANCOREADY) IVPB 750 mg/150 mL  750 mg Intravenous Q12H Berton Mount, RPH   Stopped at 10/09/21 2342  ? ? ?Past Medical History:  ?Diagnosis Date  ? Anxiety   ? takes Prozac daily  ? Anxiety   ? Aortic valve calcification   ? Asthma   ? Advair and Spirva daily  ? Asthma   ? Bipolar disorder ([place])   ? Breast cancer, right breast ([place]) 12/23/2019  ? CAD (coronary artery disease)   ? a. LHC 11/2013 done for CP/fluid retention: mild disease in prox LAD, mild-mod disease in mRCA, EF 60% with normal LVEDP. b. Normal nuc 03/2016.  ? Cancer [facility])   ? Cellulitis and abscess of trunk 03/12/2020  ? CHF (congestive heart failure) ([place])   ? Chronic diastolic CHF (congestive heart failure) ([place])   ? Chronic heart failure with preserved ejection fraction  ([place]) 11/16/2018  ? CKD (chronic kidney disease), stage II   ? COPD (chronic obstructive pulmonary disease) ([place])   ? a. nocturnal O2.  ? COPD (chronic obstructive pulmonary disease) ([place])   ? Coronary artery disease   ? Decreased urine stream   ? Diabetes mellitus   ? Diabetes mellitus without complication ([place])   ? Dyspnea   ? Elevated C-reactive protein (CRP) 01/29/2018  ? Elevated sedimentation rate 01/29/2018  ? Elevated serum hCG 01/19/2018  ? Family history of adverse reaction to anesthesia   ? mom gets nauseated  ? Foot pain, bilateral 05/22/2018  ? GERD (gastroesophageal reflux disease)   ? takes Pepcid daily  ? History of blood clots   ? left leg 3-81yr ago  ? Hyperlipidemia   ? Hypertension   ? Hypertriglyceridemia   ? Inguinal hernia, left 01/2015  ? Muscle spasm   ? Open wound of genital labia   ? Peripheral neuropathy   ? RBBB   ? Seizures ([place]   ? Sepsis ([place] 01/19/2018  ? Sinus tachycardia   ? a. persistent since 2009.  ? Smokers' cough ([place]   ? Status post mastectomy, right 01/14/2020  ? Stroke [facility]  ? left sided weakness  ? TIA (transient ischemic attack)   ? Tobacco abuse   ? Vulvar abscess 01/23/2018  ? ? ?Past Surgical History:  ?Procedure Laterality Date  ? APPLICATION OF WOUND VAC Right 01/29/2020  ? Procedure: APPLICATION OF WOUND VAC;  Surgeon: RRonny Bacon MD;  Location: [facility];  Service: General;  Laterality: Right;  ? BREAST BIOPSY Right 11/06/2019  ? uKoreacore path pending venus clip  ? GRAFT APPLICATION  33/24/4010 ? Procedure: GRAFT APPLICATION;  Surgeon: EEdrick Kins DPM;  Location: [facility];  Service: Podiatry;;  ? HERNIA REPAIR Left   ? INCISION AND DRAINAGE Bilateral 09/21/2021  ? Procedure: INCISION AND DRAINAGE;  Surgeon: EEdrick Kins DPM;  Location: [facility];  Service: Podiatry;  Laterality: Bilateral;  ? INCISION AND DRAINAGE ABSCESS N/A 01/26/2018  ? Procedure: INCISION AND DEBRIDEMENT OF VULVAR NECROTIZING SOFT TISSUE INFECTION;  Surgeon: WGreer Pickerel MD;  [place]  Service: General;  Laterality: N/A;  ? INCISION AND DRAINAGE ABSCESS N/A 07/21/2021  ? Procedure: INCISION AND DRAINAGE ABSCESS Scalp;  Surgeon: BRobert Bellow MD;  Location: [facility];  Service: General;  Laterality: N/A;  scalp and right abdomen  ? INCISION AND DRAINAGE PERIRECTAL ABSCESS N/A 01/22/2018  ? Procedure: IRRIGATION AND  DEBRIDEMENT LABIAL/VULVAR AREA;  Surgeon: Coralie Keens, MD;  Location: [place];  Service: General;  Laterality: N/A;  ? [place] N/A 01/29/2018  ? Procedure: IRRIGATION AND DEBRIDEMENT VULVA;  Surgeon: Excell Seltzer, MD;  Location: [place];  Service: General;  Laterality: N/A;  ? INGUINAL HERNIA REPAIR Left 04/08/2015  ? Procedure: OPEN LEFT INGUINAL HERNIA REPAIR WITH MESH;  Surgeon: Ralene Ok, MD;  Location: [place];  Service: General;  Laterality: Left;  ? INSERTION OF MESH Left 04/08/2015  ? Procedure: INSERTION OF MESH;  Surgeon: Ralene Ok, MD;  Location: [place];  Service: General;  Laterality: Left;  ? IRRIGATION AND DEBRIDEMENT ABSCESS N/A 07/21/2021  ? Procedure: IRRIGATION AND DEBRIDEMENT ABSCESS abdomen;  Surgeon: Robert Bellow, MD;  Location: [facility];  Service: General;  Laterality: N/A;  ? LAPAROSCOPY    ? Endometriosis  ? LEFT HEART CATHETERIZATION WITH CORONARY ANGIOGRAM N/A 12/05/2013  ? Procedure: LEFT HEART CATHETERIZATION WITH CORONARY ANGIOGRAM;  Surgeon: Jettie Booze, MD;  [facility];  Service: Cardiovascular;  Laterality: N/A;  ? LOWER EXTREMITY ANGIOGRAPHY Left 09/22/2021  ? Procedure: Lower Extremity Angiography;  Surgeon: Katha Cabal, MD;  Location: [place] CV LAB;  Service: Cardiovascular;  Laterality: Left;  ? MASTECTOMY Right   ? right kidney drained    ? SIMPLE MASTECTOMY WITH AXILLARY SENTINEL NODE BIOPSY Right 12/23/2019  ? Procedure: Right SIMPLE MASTECTOMY WITH AXILLARY SENTINEL NODE BIOPSY;  Surgeon: Ronny Bacon, MD;  Location: [facility];  Service: General;   Laterality: Right;  ? TEE WITHOUT CARDIOVERSION N/A 11/28/2013  ? Procedure: TRANSESOPHAGEAL ECHOCARDIOGRAM (TEE);  Surgeon: Thayer Headings, MD;  Location: [place];  Service: Cardiovascular;  Laterality:

## 2021-10-10 NOTE — Progress Notes (Signed)
PT Cancellation Note ? ?Patient Details ?Name: [name] ?MRN: [id number] ?DOB: 01/03/1971 ? ? ?Cancelled Treatment:    Reason Eval/Treat Not Completed: Other (comment) PT orders received, chart reviewed. Per MD, hold PT evaluation today as vascular is being consulted for possible BKA. Will f/u as able & as pt is appropriate. ? ?Lavone Nian, PT, DPT ?10/10/21, 8:21 AM ? ? ?Waunita Schooner ?10/10/2021, 8:21 AM ?

## 2021-10-11 ENCOUNTER — Encounter
Admission: EM | Disposition: A | Discharge status: Skilled Nursing Facility | Payer: Self-pay | Source: Home / Self Care | Attending: Internal Medicine

## 2021-10-11 ENCOUNTER — Inpatient Hospital Stay: Payer: BC Managed Care – PPO | Admitting: Certified Registered Nurse Anesthetist

## 2021-10-11 ENCOUNTER — Other Ambulatory Visit: Payer: Self-pay

## 2021-10-11 ENCOUNTER — Encounter: Payer: Self-pay | Admitting: Internal Medicine

## 2021-10-11 DIAGNOSIS — L03116 Cellulitis of left lower limb: Secondary | ICD-10-CM | POA: Diagnosis not present

## 2021-10-11 DIAGNOSIS — I96 Gangrene, not elsewhere classified: Secondary | ICD-10-CM | POA: Diagnosis not present

## 2021-10-11 DIAGNOSIS — C50911 Malignant neoplasm of unspecified site of right female breast: Secondary | ICD-10-CM

## 2021-10-11 DIAGNOSIS — Z170 Estrogen receptor positive status [ER+]: Secondary | ICD-10-CM

## 2021-10-11 DIAGNOSIS — R7881 Bacteremia: Secondary | ICD-10-CM | POA: Diagnosis not present

## 2021-10-11 DIAGNOSIS — Z794 Long term (current) use of insulin: Secondary | ICD-10-CM | POA: Diagnosis not present

## 2021-10-11 DIAGNOSIS — E1165 Type 2 diabetes mellitus with hyperglycemia: Secondary | ICD-10-CM | POA: Diagnosis not present

## 2021-10-11 DIAGNOSIS — B9562 Methicillin resistant Staphylococcus aureus infection as the cause of diseases classified elsewhere: Secondary | ICD-10-CM | POA: Diagnosis not present

## 2021-10-11 HISTORY — PX: AMPUTATION: SHX166

## 2021-10-11 LAB — CBC WITH DIFFERENTIAL/PLATELET
Abs Immature Granulocytes: 0.15 10*3/uL — ABNORMAL HIGH (ref 0.00–0.07)
Basophils Absolute: 0 10*3/uL (ref 0.0–0.1)
Basophils Relative: 0 %
Eosinophils Absolute: 0.3 10*3/uL (ref 0.0–0.5)
Eosinophils Relative: 3 %
HCT: 30.7 % — ABNORMAL LOW (ref 36.0–46.0)
Hemoglobin: 9.6 g/dL — ABNORMAL LOW (ref 12.0–15.0)
Immature Granulocytes: 1 %
Lymphocytes Relative: 8 %
Lymphs Abs: 0.9 10*3/uL (ref 0.7–4.0)
MCH: 26 pg (ref 26.0–34.0)
MCHC: 31.3 g/dL (ref 30.0–36.0)
MCV: 83.2 fL (ref 80.0–100.0)
Monocytes Absolute: 0.8 10*3/uL (ref 0.1–1.0)
Monocytes Relative: 7 %
Neutro Abs: 9.7 10*3/uL — ABNORMAL HIGH (ref 1.7–7.7)
Neutrophils Relative %: 81 %
Platelets: 281 10*3/uL (ref 150–400)
RBC: 3.69 MIL/uL — ABNORMAL LOW (ref 3.87–5.11)
RDW: 15.8 % — ABNORMAL HIGH (ref 11.5–15.5)
WBC: 11.8 10*3/uL — ABNORMAL HIGH (ref 4.0–10.5)
nRBC: 0 % (ref 0.0–0.2)

## 2021-10-11 LAB — BASIC METABOLIC PANEL
Anion gap: 9 (ref 5–15)
BUN: 38 mg/dL — ABNORMAL HIGH (ref 6–20)
CO2: 26 mmol/L (ref 22–32)
Calcium: 8.3 mg/dL — ABNORMAL LOW (ref 8.9–10.3)
Chloride: 101 mmol/L (ref 98–111)
Creatinine, Ser: 0.97 mg/dL (ref 0.44–1.00)
GFR, Estimated: >60 mL/min (ref >60)
Glucose, Bld: 249 mg/dL — ABNORMAL HIGH (ref 70–99)
Potassium: 4.1 mmol/L (ref 3.5–5.1)
Sodium: 136 mmol/L (ref 135–145)

## 2021-10-11 LAB — MAGNESIUM: Magnesium: 2.5 mg/dL — ABNORMAL HIGH (ref 1.7–2.4)

## 2021-10-11 LAB — GLUCOSE, CAPILLARY
Glucose-Capillary: 271 mg/dL — ABNORMAL HIGH (ref 70–99)
Glucose-Capillary: 286 mg/dL — ABNORMAL HIGH (ref 70–99)
Glucose-Capillary: 216 mg/dL — ABNORMAL HIGH (ref 70–99)
Glucose-Capillary: 225 mg/dL — ABNORMAL HIGH (ref 70–99)
Glucose-Capillary: 247 mg/dL — ABNORMAL HIGH (ref 70–99)

## 2021-10-11 LAB — AEROBIC CULTURE W GRAM STAIN (SUPERFICIAL SPECIMEN): Gram Stain: NONE SEEN

## 2021-10-11 LAB — ABO/RH: ABO/RH(D): O POS

## 2021-10-11 SURGERY — AMPUTATION BELOW KNEE
Anesthesia: General | Site: Leg Lower | Laterality: Left

## 2021-10-11 MED ORDER — LIDOCAINE HCL (CARDIAC) PF 100 MG/5ML IV SOSY
PREFILLED_SYRINGE | INTRAVENOUS | Status: DC | PRN
Start: 2021-10-11 — End: 2021-10-11
  Administered 2021-10-11: 50 mg via INTRAVENOUS

## 2021-10-11 MED ORDER — HYDROCODONE-ACETAMINOPHEN 5-325 MG PO TABS
1.0000 | ORAL_TABLET | Freq: Four times a day (QID) | ORAL | Status: DC | PRN
  Administered 2021-10-11: 1 via ORAL
  Administered 2021-10-12 – 2021-10-14 (×6): 2 via ORAL
  Filled 2021-10-11: qty 2
  Filled 2021-10-11: qty 1
  Filled 2021-10-11 (×3): qty 2
  Filled 2021-10-11: qty 1
  Filled 2021-10-11 (×2): qty 2

## 2021-10-11 MED ORDER — PROPOFOL 10 MG/ML IV BOLUS
INTRAVENOUS | Status: AC
  Filled 2021-10-11: qty 20

## 2021-10-11 MED ORDER — MIDAZOLAM HCL 2 MG/2ML IJ SOLN
INTRAMUSCULAR | Status: AC
  Filled 2021-10-11: qty 2

## 2021-10-11 MED ORDER — INSULIN GLARGINE-YFGN 100 UNIT/ML ~~LOC~~ SOLN
35.0000 [IU] | Freq: Every day | SUBCUTANEOUS | Status: DC
  Administered 2021-10-11 – 2021-10-12 (×2): 35 [IU] via SUBCUTANEOUS
  Filled 2021-10-11 (×3): qty 0.35

## 2021-10-11 MED ORDER — FENTANYL CITRATE (PF) 100 MCG/2ML IJ SOLN
INTRAMUSCULAR | Status: AC
  Filled 2021-10-11: qty 2

## 2021-10-11 MED ORDER — SODIUM CHLORIDE 0.9 % IV SOLN: INTRAVENOUS | Status: DC | PRN

## 2021-10-11 MED ORDER — HYDROMORPHONE HCL 1 MG/ML IJ SOLN
1.0000 mg | INTRAMUSCULAR | Status: DC | PRN
  Administered 2021-10-13: 1 mg via INTRAVENOUS
  Filled 2021-10-11: qty 1

## 2021-10-11 MED ORDER — FENTANYL CITRATE (PF) 100 MCG/2ML IJ SOLN
INTRAMUSCULAR | Status: AC
  Administered 2021-10-11: 25 ug via INTRAVENOUS
  Filled 2021-10-11: qty 2

## 2021-10-11 MED ORDER — ONDANSETRON HCL 4 MG/2ML IJ SOLN
INTRAMUSCULAR | Status: DC | PRN
  Administered 2021-10-11: 4 mg via INTRAVENOUS

## 2021-10-11 MED ORDER — SUCCINYLCHOLINE CHLORIDE 200 MG/10ML IV SOSY
PREFILLED_SYRINGE | INTRAVENOUS | Status: DC | PRN
  Administered 2021-10-11: 100 mg via INTRAVENOUS

## 2021-10-11 MED ORDER — LACTATED RINGERS IV SOLN: INTRAVENOUS | Status: DC | PRN

## 2021-10-11 MED ORDER — PHENYLEPHRINE HCL-NACL 20-0.9 MG/250ML-% IV SOLN
INTRAVENOUS | Status: DC | PRN
  Administered 2021-10-11: 50 ug/min via INTRAVENOUS

## 2021-10-11 MED ORDER — PROPOFOL 10 MG/ML IV BOLUS
INTRAVENOUS | Status: DC | PRN
  Administered 2021-10-11: 90 mg via INTRAVENOUS

## 2021-10-11 MED ORDER — COLLAGENASE 250 UNIT/GM EX OINT
TOPICAL_OINTMENT | Freq: Every day | CUTANEOUS | Status: DC
  Filled 2021-10-11: qty 30

## 2021-10-11 MED ORDER — FENTANYL CITRATE (PF) 100 MCG/2ML IJ SOLN
25.0000 ug | INTRAMUSCULAR | Status: DC | PRN
  Administered 2021-10-11 (×3): 25 ug via INTRAVENOUS

## 2021-10-11 MED ORDER — 0.9 % SODIUM CHLORIDE (POUR BTL) OPTIME
TOPICAL | Status: DC | PRN
  Administered 2021-10-11: 1000 mL

## 2021-10-11 MED ORDER — DEXAMETHASONE SODIUM PHOSPHATE 10 MG/ML IJ SOLN
INTRAMUSCULAR | Status: DC | PRN
  Administered 2021-10-11: 10 mg via INTRAVENOUS

## 2021-10-11 SURGICAL SUPPLY — 40 items
APL PRP STRL LF DISP 70% ISPRP (MISCELLANEOUS) ×1
BLADE SAW SAG 25.4X90 (BLADE) ×2 IMPLANT
BNDG CMPR STD VLCR NS LF 5.8X6 (GAUZE/BANDAGES/DRESSINGS) ×1
BNDG COHESIVE 4X5 TAN ST LF (GAUZE/BANDAGES/DRESSINGS) ×2 IMPLANT
BNDG ELASTIC 6X5.8 VLCR NS LF (GAUZE/BANDAGES/DRESSINGS) ×2 IMPLANT
BNDG GAUZE ELAST 4 BULKY (GAUZE/BANDAGES/DRESSINGS) ×4 IMPLANT
BRUSH SCRUB EZ  4% CHG (MISCELLANEOUS) ×2
BRUSH SCRUB EZ 4% CHG (MISCELLANEOUS) ×1 IMPLANT
CHLORAPREP W/TINT 26 (MISCELLANEOUS) ×2 IMPLANT
CUFF TOURN SGL QUICK 34 (TOURNIQUET CUFF) ×2
CUFF TRNQT CYL 34X4.125X (TOURNIQUET CUFF) IMPLANT
DRAPE INCISE IOBAN 66X45 STRL (DRAPES) ×1 IMPLANT
ELECT CAUTERY BLADE 6.4 (BLADE) ×2 IMPLANT
ELECT REM PT RETURN 9FT ADLT (ELECTROSURGICAL) ×2
ELECTRODE REM PT RTRN 9FT ADLT (ELECTROSURGICAL) ×1 IMPLANT
GAUZE XEROFORM 1X8 LF (GAUZE/BANDAGES/DRESSINGS) ×4 IMPLANT
GLOVE SURG SYN 7.0 (GLOVE) ×2 IMPLANT
GLOVE SURG SYN 7.0 PF PI (GLOVE) ×1 IMPLANT
GOWN STRL REUS W/ TWL LRG LVL3 (GOWN DISPOSABLE) ×1 IMPLANT
GOWN STRL REUS W/ TWL XL LVL3 (GOWN DISPOSABLE) ×1 IMPLANT
GOWN STRL REUS W/TWL LRG LVL3 (GOWN DISPOSABLE) ×2
GOWN STRL REUS W/TWL XL LVL3 (GOWN DISPOSABLE) ×2
HANDLE YANKAUER SUCT BULB TIP (MISCELLANEOUS) ×2 IMPLANT
KIT TURNOVER KIT A (KITS) ×2 IMPLANT
LABEL OR SOLS (LABEL) ×2 IMPLANT
MANIFOLD NEPTUNE II (INSTRUMENTS) ×2 IMPLANT
NS IRRIG 1000ML POUR BTL (IV SOLUTION) ×2 IMPLANT
PACK EXTREMITY ARMC (MISCELLANEOUS) ×2 IMPLANT
PAD ABD DERMACEA PRESS 5X9 (GAUZE/BANDAGES/DRESSINGS) ×4 IMPLANT
PAD PREP 24X41 OB/GYN DISP (PERSONAL CARE ITEMS) ×2 IMPLANT
SPONGE T-LAP 18X18 ~~LOC~~+RFID (SPONGE) ×6 IMPLANT
STAPLER SKIN PROX 35W (STAPLE) ×2 IMPLANT
STOCKINETTE M/LG 89821 (MISCELLANEOUS) ×2 IMPLANT
SUT SILK 2 0 (SUTURE) ×2
SUT SILK 2 0 SH (SUTURE) ×7 IMPLANT
SUT SILK 2-0 18XBRD TIE 12 (SUTURE) ×1 IMPLANT
SUT SILK 3 0 (SUTURE) ×2
SUT SILK 3-0 18XBRD TIE 12 (SUTURE) ×1 IMPLANT
SUT VIC AB 0 CT1 36 (SUTURE) ×5 IMPLANT
SUT VIC AB 2-0 CT1 (SUTURE) ×4 IMPLANT

## 2021-10-11 NOTE — Progress Notes (Signed)
PT Cancellation Note ? ?Patient Details ?Name: [name] ?MRN: [id number] ?DOB: 05-08-1971 ? ? ?Cancelled Treatment:    Reason Eval/Treat Not Completed: Other (comment) PT orders received, chart reviewed. Pt now planning for L BKA either today or tomorrow. After speaking with MD via secure chat, will d/c current PT orders & await new ones s/p surgery. ? ?Lavone Nian, PT, DPT ?10/11/21, 8:43 AM ? ? ?Waunita Schooner ?10/11/2021, 8:43 AM ?

## 2021-10-11 NOTE — Interval H&P Note (Signed)
History and Physical Interval Note: ? ?10/11/2021 ?4:17 PM ? ?[name]  has presented today for surgery, with the diagnosis of Gangrene LEFT foot.  The various methods of treatment have been discussed with the patient and family. After consideration of risks, benefits and other options for treatment, the patient has consented to  Procedure(s): ?AMPUTATION BELOW KNEE (Left) as a surgical intervention.  The patient's history has been reviewed, patient examined, no change in status, stable for surgery.  I have reviewed the patient's chart and labs.  Questions were answered to the patient's satisfaction.   ? ? ?Leotis Pain ? ? ?

## 2021-10-11 NOTE — Op Note (Signed)
? ?  OPERATIVE NOTE ? ? ?PROCEDURE: ?Left below-the-knee amputation ? ?PRE-OPERATIVE DIAGNOSIS: Left foot gangrene ? ?POST-OPERATIVE DIAGNOSIS: same as above ? ?SURGEON: Leotis Pain, MD ? ?ASSISTANT(S): none ? ?ANESTHESIA: general ? ?ESTIMATED BLOOD LOSS: 250 cc ? ?FINDING(S): ?none ? ?SPECIMEN(S):  Left below-the-knee amputation ? ?INDICATIONS:   ?[name] is a 51 y.o. female who presents with left leg gangrene.  The patient is scheduled for a left below-the-knee amputation.  I discussed in depth with the patient the risks, benefits, and alternatives to this procedure.  The patient is aware that the risk of this operation included but are not limited to:  bleeding, infection, myocardial infarction, stroke, death, failure to heal amputation wound, and possible need for more proximal amputation.  The patient is aware of the risks and agrees proceed forward with the procedure. ? ?DESCRIPTION: ? After full informed written consent was obtained from the patient, the patient was brought back to the operating room, and placed supine upon the operating table.  Prior to induction, the patient received IV antibiotics.  The patient was then prepped and draped in the standard fashion for a below-the-knee amputation.  After obtaining adequate anesthesia, the patient was prepped and draped in the standard fashion for a left below-the-knee amputation.  I marked out the anterior incision two finger breadths below the tibial tuberosity and then the marked out a posterior flap that was one third of the circumference of the calf in length.   I made the incisions for these flaps, and then dissected through the subcutaneous tissue, fascia, and muscle anteriorly.  I elevated  the periosteal tissue superiorly so that the tibia was about 3-4 cm shorter than the anterior skin flap.  I then transected the tibia with a power saw and then took a wedge off the tibia anteriorly with the power saw.  Then I smoothed out the rough edges.  In a  similar fashion, I cut back the fibula about two centimeters higher than the level of the tibia with a bone cutter.  I put a bone hook into the distal tibia and then used a large amputation knife to sharply develop a tissue plane through the muscle along the fibula.  In such fashion, the posterior flap was developed.  At this point, the specimen was passed off the field as the below-the-knee amputation.  At this point, I clamped all visibly bleeding arteries and veins using a combination of suture ligation with Silk suture and electrocautery.  Bleeding continued to be controlled with electrocautery and suture ligature.  The stump was washed off with sterile normal saline and no further active bleeding was noted.  I reapproximated the anterior and posterior fascia  with interrupted stitches of 0 Vicryl.  This was completed along the entire length of anterior and posterior fascia until there were no more loose space in the fascial line. I then placed a layer of 2-0 Vicryl sutures in the subcutaneous tissue. The skin was then  reapproximated with staples.  The stump was washed off and dried.  The incision was dressed with Xeroform and  then fluffs were applied.  Kerlix was wrapped around the leg and then gently an ACE wrap was applied.   ? ?COMPLICATIONS: none ? ?CONDITION: stable ? ? ?Leotis Pain ? ?10/11/2021, 5:50 PM ? ? ? ?This note was created with Dragon Medical transcription system. Any errors in dictation are purely unintentional.  ?

## 2021-10-11 NOTE — Plan of Care (Signed)
?  Problem: Clinical Measurements: ?Goal: Ability to avoid or minimize complications of infection will improve ?Outcome: Not Progressing ?  ?Problem: Skin Integrity: ?Goal: Skin integrity will improve ?Outcome: Not Progressing ?  ?

## 2021-10-11 NOTE — Progress Notes (Addendum)
?Progress Note ? ? ? ?[name]   ?MRN:[id number]  ?DOB: 10/20/1970  ?DOA: 10/07/2021     3 ?PCP: Clark, Katherine K, NP ? ?Initial CC: left foot wound ? ?Hospital Course: ?[name] is a 50 yo female with PMH PVD, HTN, HLD, DMII, COPD, asthma, CVA, GERD, depression/anxiety, tobacco abuse, seizure d/o, anemia, CAD, CKD3a, dCHF, right breast cancer (s/p mastectomy) who presented with worsening pain and swelling in her left foot and a nonhealing wound. ? ?She was recently hospitalized from 09/20/2021 until 09/23/2021 for a diabetic infection involving her left foot and she had undergone debridement on 09/21/2021 with podiatry in the OR.  She also had vascular surgery evaluation with abdominal aortogram and left lower extremity angiogram (no significant stenosis/no intervention required).  ? ?She had been healing fairly well at home until a few days prior to admission when she developed redness, pain, swelling of her left leg.  She also has had an ongoing diffuse rash presumed a drug rash possibly from clindamycin at discharge.  She remains a poor historian and was unable to elaborate on her medications once again and whether or not the rash preceded starting clindamycin. ?She was started on antibiotics and podiatry was also consulted.  She also underwent vascular surgery consultation given concern for poor healing.  Recommendation was made for left BKA. ?She was also evaluated by infectious disease due to positive blood cultures from admission with MRSA which was considered seeded from her foot wound.  TTE was negative for vegetations however TEE was canceled in setting of BKA. ? ?Interval History:  ?No events overnight.  Husband present bedside this morning.  He and patient are amenable for left BKA today with vascular surgery. ?They do have poor health literacy however explained multiple times that plan would need to be for probable rehab after discharge which they understand. ? ?Assessment and Plan: ?* Cellulitis  of left lower extremity ?- Necrotic wound on dorsal foot with purulent drainage appreciated on plantar surface.  Foul-smelling ?-Per ID, infection felt to be slowly contributed to MRSA.  Rocephin and Flagyl have been discontinued.  Remains on monotherapy vancomycin ?-Appreciate podiatry evaluation.  Concern is for healing of wound especially if further debridement.  Vascular consultation obtained and recommendation made for left BKA, patient also amenable to this.  Tentative plan for surgery 10/11/21 ?-She will likely require SNF after hospital discharge as well ? ?Bacteremia ?- Blood cultures from admission positive for 2/4 bottles (1 set only) of GPC which speciated to MRSA.  Given speciation to MRSA, considered infectious ?- TTE is negative for vegetation; TEE currently canceled while BKA work-up commenced.  Likely still needs TEE to r/o endocarditis ?- continue vanc per pharmacy ?-ID following  ? ?Uncontrolled type 2 diabetes mellitus with hyperglycemia, with long-term current use of insulin (HCC) ?- A1c 10.1% ?- Patient had difficulty with her regimen last hospitalization and questionable compliance ?- Continue on SSI and Semglee.  Will adjust as necessary ?- Continue CBG monitoring ? ?Allergic reaction ?- Drug rash appreciated on abdomen, lower chest, and thighs.  Patient has been taking Benadryl at home with minimal improvement.  She denies pruritus.  Possible reaction to previously prescribed clindamycin was about her only new medication.  She had a similar rash but smaller last hospitalization after having been on doxycycline ?- s/p Benadryl, Pepcid, steroid ? ?PAD (peripheral artery disease) (HCC) ?- underwent angiogram of abdomen and left lower extremity on 09/22/2021 with no significant stenosis nor stenting required ?-Continue aspirin and Crestor ?-  See cellulitis above.  Tentative plan is now for left BKA due to concern for healing of foot wound on 10/11/21 ? ?Chronic diastolic CHF (congestive heart failure)  (HCC) ?- Stable, no S/S exacerbation ?- Continue spironolactone and aspirin ? ?Hyponatremia-resolved as of 10/10/2021 ?- Now resolved ? ?Chronic foot ulcer with necrosis of muscle, left (HCC) ?- See cellulitis ? ?Breast cancer (HCC) ?- Continue anastrozole ?- s/p right mastectomy  ? ?CAD (coronary artery disease) ?Continue aspirin and Crestor ? ?Chronic kidney disease, stage 3a (HCC) ?- patient has history of CKD3a. Baseline creat ~ 1 - 1.1, eGFR 50closely ? ?Bipolar disorder (HCC) ?Continue trazodone, Pristiq, buspirone and Celexa ? ?COPD with chronic bronchitis (HCC) ?Not acutely exacerbated ?Continue as needed bronchodilator therapy as well as inhaled steroids ? ?Tobacco abuse ?Smoking cessation discussed with patient ?-Continue nicotine patch ? ?Morbid (severe) obesity due to excess calories (HCC) ?Complicates overall prognosis and care ?Lifestyle modification and exercise has been discussed with patient in detail ? ? ? ?Old records reviewed in assessment of this patient ? ?Antimicrobials: ?Rocephin 10/07/2021 >> 10/08/2021 ?Vancomycin 10/07/2021 >> current ?Flagyl 10/08/2021 >> 10/08/2021 ? ?DVT prophylaxis:  ?  Lovenox ? ? ?Code Status:   Code Status: Full Code ? ?Disposition Plan: Probable SNF ?Status is: Inpt ? ?Objective: ?Blood pressure (!) 144/70, pulse 78, temperature 98.2 ?F (36.8 ?C), resp. rate 18, height 5' 4" (1.626 m), weight 104.3 kg, SpO2 97 %.  ?Examination:  ?Physical Exam ?Constitutional:   ?   General: She is not in acute distress. ?   Appearance: Normal appearance.  ?HENT:  ?   Head: Normocephalic and atraumatic.  ?   Mouth/Throat:  ?   Mouth: Mucous membranes are moist.  ?Eyes:  ?   Extraocular Movements: Extraocular movements intact.  ?Cardiovascular:  ?   Rate and Rhythm: Normal rate and regular rhythm.  ?   Comments: 2/6 suspected diastolic murmur/click best noted in LUSB and LLSB ?Pulmonary:  ?   Effort: Pulmonary effort is normal.  ?   Breath sounds: Normal breath sounds.  ?Abdominal:  ?    General: Bowel sounds are normal. There is no distension.  ?   Palpations: Abdomen is soft.  ?   Tenderness: There is no abdominal tenderness.  ?Musculoskeletal:  ?   Cervical back: Normal range of motion and neck supple.  ?   Comments: Left foot with large necrotic dry wound on dorsal surface; approx 3 cm diameter blister on plantar surface near toes draining foul smelling pus; left leg dense erythema with calor and mild dolor   ?Neurological:  ?   Mental Status: She is alert.  ?  ?Pic from 10/08/21: ? ? ? ? ?Consultants:  ?Podiatry ? ?Procedures:  ? ? ?Data Reviewed: ?Results for orders placed or performed during the hospital encounter of 10/07/21 (from the past 24 hour(s))  ?Glucose, capillary     Status: Abnormal  ? Collection Time: 10/10/21  5:23 PM  ?Result Value Ref Range  ? Glucose-Capillary 293 (H) 70 - 99 mg/dL  ? Comment 1 Notify RN   ?Vancomycin, trough     Status: Abnormal  ? Collection Time: 10/10/21  9:05 PM  ?Result Value Ref Range  ? Vancomycin Tr 25 (HH) 15 - 20 ug/mL  ?Glucose, capillary     Status: Abnormal  ? Collection Time: 10/10/21  9:50 PM  ?Result Value Ref Range  ? Glucose-Capillary 241 (H) 70 - 99 mg/dL  ? Comment 1 Notify RN   ?Basic metabolic panel       Status: Abnormal  ? Collection Time: 10/11/21  3:42 AM  ?Result Value Ref Range  ? Sodium 136 135 - 145 mmol/L  ? Potassium 4.1 3.5 - 5.1 mmol/L  ? Chloride 101 98 - 111 mmol/L  ? CO2 26 22 - 32 mmol/L  ? Glucose, Bld 249 (H) 70 - 99 mg/dL  ? BUN 38 (H) 6 - 20 mg/dL  ? Creatinine, Ser 0.97 0.44 - 1.00 mg/dL  ? Calcium 8.3 (L) 8.9 - 10.3 mg/dL  ? GFR, Estimated >60 >60 mL/min  ? Anion gap 9 5 - 15  ?CBC with Differential/Platelet     Status: Abnormal  ? Collection Time: 10/11/21  3:42 AM  ?Result Value Ref Range  ? WBC 11.8 (H) 4.0 - 10.5 K/uL  ? RBC 3.69 (L) 3.87 - 5.11 MIL/uL  ? Hemoglobin 9.6 (L) 12.0 - 15.0 g/dL  ? HCT 30.7 (L) 36.0 - 46.0 %  ? MCV 83.2 80.0 - 100.0 fL  ? MCH 26.0 26.0 - 34.0 pg  ? MCHC 31.3 30.0 - 36.0 g/dL  ? RDW 15.8  (H) 11.5 - 15.5 %  ? Platelets 281 150 - 400 K/uL  ? nRBC 0.0 0.0 - 0.2 %  ? Neutrophils Relative % 81 %  ? Neutro Abs 9.7 (H) 1.7 - 7.7 K/uL  ? Lymphocytes Relative 8 %  ? Lymphs Abs 0.9 0.7 - 4.0 K/u

## 2021-10-11 NOTE — Anesthesia Postprocedure Evaluation (Signed)
Anesthesia Post Note ? ?Patient: [name] ? ?Procedure(s) Performed: AMPUTATION BELOW KNEE (Left: Leg Lower) ? ?Patient location during evaluation: PACU ?Anesthesia Type: General ?Level of consciousness: awake and alert ?Pain management: pain level controlled ?Vital Signs Assessment: post-procedure vital signs reviewed and stable ?Respiratory status: spontaneous breathing, nonlabored ventilation, respiratory function stable and patient connected to nasal cannula oxygen ?Cardiovascular status: blood pressure returned to baseline and stable ?Postop Assessment: no apparent nausea or vomiting ?Anesthetic complications: no ? ? ?No notable events documented. ? ? ?Last Vitals:  ?Vitals:  ? 10/11/21 1830 10/11/21 1902  ?BP: (!) 153/74 (!) 147/66  ?Pulse: 93 82  ?Resp: 16 16  ?Temp: 36.4 ?C 36.7 ?C  ?SpO2: 96% 98%  ?  ?Last Pain:  ?Vitals:  ? 10/11/21 1830  ?TempSrc:   ?PainSc: 2   ? ? ?  ?  ?  ?  ?  ?  ? ?Precious Haws Masud Holub ? ? ? ? ?

## 2021-10-11 NOTE — Transfer of Care (Signed)
Immediate Anesthesia Transfer of Care Note ? ?Patient: [name] ? ?Procedure(s) Performed: AMPUTATION BELOW KNEE (Left: Leg Lower) ? ?Patient Location: PACU ? ?Anesthesia Type:General ? ?Level of Consciousness: drowsy ? ?Airway & Oxygen Therapy: Patient Spontanous Breathing and Patient connected to face mask oxygen ? ?Post-op Assessment: Report given to RN and Post -op Vital signs reviewed and stable ? ?Post vital signs: Reviewed and stable ? ?Last Vitals:  ?Vitals Value Taken Time  ?BP 130/69 10/11/21 1749  ?Temp    ?Pulse 81 10/11/21 1755  ?Resp 20 10/11/21 1755  ?SpO2 100 % 10/11/21 1755  ?Vitals shown include unvalidated device data. ? ?Last Pain:  ?Vitals:  ? 10/11/21 1430  ?TempSrc: Oral  ?PainSc: 0-No pain  ?   ? ?Patients Stated Pain Goal: 2 (10/11/21 0501) ? ?Complications: No notable events documented. ?

## 2021-10-11 NOTE — Progress Notes (Signed)
? ?  Date of Admission:  10/07/2021    ? ?ID: [name] is a 51 y.o. female  ?Principal Problem: ?  Cellulitis of left lower extremity ?Active Problems: ?  Morbid (severe) obesity due to excess calories ([place]) ?  Tobacco abuse ?  COPD with chronic bronchitis ([place]) ?  Bipolar disorder ([place]) ?  Chronic diastolic CHF (congestive heart failure) ([place]) ?  Uncontrolled type 2 diabetes mellitus with hyperglycemia, with long-term current use of insulin ([place]) ?  PAD (peripheral artery disease) ([place]) ?  Chronic kidney disease, stage 3a ([place]) ?  CAD (coronary artery disease) ?  Breast cancer ([place]) ?  Chronic foot ulcer with necrosis of muscle, left ([place]) ?  Bacteremia ?  Allergic reaction ? ? ? ?Subjective: ?Feeling okay ?Awaiting surgery this afternoon ?No fever ? ?Medications:  ? anastrozole  1 mg Oral Daily  ? aspirin EC  81 mg Oral QAC breakfast  ? busPIRone  30 mg Oral BID  ? citalopram  20 mg Oral Daily  ? collagenase   Topical Daily  ? desvenlafaxine  100 mg Oral Daily  ? enoxaparin (LOVENOX) injection  0.5 mg/kg Subcutaneous Q24H  ? insulin aspart  0-15 Units Subcutaneous TID WC  ? insulin glargine-yfgn  35 Units Subcutaneous Daily  ? mometasone-formoterol  2 puff Inhalation BID  ? nicotine  14 mg Transdermal Daily  ? pregabalin  300 mg Oral BID  ? rOPINIRole  1 mg Oral QHS  ? rosuvastatin  20 mg Oral QHS  ? spironolactone  50 mg Oral Daily  ? traZODone  200 mg Oral QHS  ? ? ?Objective: ?Vital signs in last 24 hours: ?Temp:  [98.2 ?F (36.8 ?C)-100.1 ?F (37.8 ?C)] 98.2 ?F (36.8 ?C) (04/03 6811) ?Pulse Rate:  [78-90] 78 (04/03 0738) ?Resp:  [16-18] 18 (04/03 5726) ?BP: (130-144)/(60-74) 144/70 (04/03 2035) ?SpO2:  [91 %-97 %] 97 % (04/03 0738) ? ?PHYSICAL EXAM:  ?General: Alert, cooperative, no distress, chronically ill ?Lungs: b/l air entry ?Heart: s1s2 ?Abdomen: Soft, non-tender,not distended. Bowel sounds normal. No masses ?Extremities:  ?Left leg eschar with wound on foot, heel ? ? ? ? ? ? ?Infected wound rt  leg ? ?Skin: No rashes or lesions. Or bruising ?Lymph: Cervical, supraclavicular normal. ?Neurologic:peripheral neuropathy ? ?Lab Results ?Recent Labs  ?  10/10/21 ?0338 10/11/21 ?0342  ?WBC 14.5* 11.8*  ?HGB 10.0* 9.6*  ?HCT 32.4* 30.7*  ?NA 135 136  ?K 4.5 4.1  ?CL 99 101  ?CO2 27 26  ?BUN 46* 38*  ?CREATININE 1.01* 0.97  ?Microbiology: ?10/07/21- MRSA in blod culture ?10/08/21- wound MRSA ?10/09/21 BC NG  ? ? ? ?Assessment/Plan: ?MRSA bacteremia with multiple wounds due to MRSA ?Repeat blood culture neg so far ?On vanco ?Need to r/o endocarditis as septic emboli could cause these wounds ? ?Left leg deep wounds including on the heel- previous debridement- going for BKA ? ? ?DM with peripheral neuropathy ? ?Anemia ? ?Deconditioning ? ?Ca bvreast s/p rt mastectomy on arimidex ? ?Anxiety/depression on buspar, citalopram, desvenlafaxine, trazadone  ? ? ?Discussed the management with patient, her partner at bed side and care team ?  ?

## 2021-10-11 NOTE — Progress Notes (Signed)
Inpatient Diabetes Program Recommendations ? ?AACE/ADA: New Consensus Statement on Inpatient Glycemic Control (2015) ? ?Target Ranges:  Prepandial:   less than 140 mg/dL ?     Peak postprandial:   less than 180 mg/dL (1-2 hours) ?     Critically ill patients:  140 - 180 mg/dL  ? ? Latest Reference Range & Units 10/10/21 07:35 10/10/21 11:39 10/10/21 17:23 10/10/21 21:50  ?Glucose-Capillary 70 - 99 mg/dL 308 (H) ? ?11 units Novolog ?'@1037'$  255 (H) ? ?8 units Novolog ?'@1321'$  ? ?30 units Semglee 293 (H) ? ?8 units Novolog ? 241 (H)  ?(H): Data is abnormally high ? Latest Reference Range & Units 10/11/21 08:39  ?Glucose-Capillary 70 - 99 mg/dL 271 (H) ? ?8 units Novolog ? ?35 units Semglee  ?(H): Data is abnormally high ? ?Home DM Meds: Lantus 25 units Daily ?                              Novolog 1-9 units QID per SSI ?                              Synjardy 25/1000 mg daily ? ?Current Orders: Semglee 35 units Daily  ?Novolog 0-15 units TID ? ? ?MD- Note Increased Semglee dose to 35 daily this AM (got 30 units yest).  ? ?Afternoon CBGs remain elevated as well ? ?Note pt NPO this AM for possible BKA surgery today ? ?Please consider: ? ?1. Continue to titrate Semglee upward based on AM CBG ? ?2. Start Novolog Meal Coverage after Surgery: Novolog 6 units TID with meals ?HOLD if pt eats less than 50% meals ? ? ? ? ?--Will follow patient during hospitalization-- ? ?Wyn Quaker RN, MSN, CDE ?Diabetes Coordinator ?Inpatient Glycemic Control Team ?Team Pager: [phone] (8a-5p) ? ? ? ? ? ?

## 2021-10-11 NOTE — Anesthesia Procedure Notes (Signed)
Procedure Name: Intubation ?Date/Time: 10/11/2021 4:20 PM ?Performed by: Beverely Low, CRNA ?Pre-anesthesia Checklist: Patient identified, Patient being monitored, Timeout performed, Emergency Drugs available and Suction available ?Patient Re-evaluated:Patient Re-evaluated prior to induction ?Oxygen Delivery Method: Circle system utilized ?Preoxygenation: Pre-oxygenation with 100% oxygen ?Induction Type: IV induction ?Ventilation: Mask ventilation without difficulty ?Laryngoscope Size: Mac and 3 ?Grade View: Grade I ?Tube type: Oral ?Tube size: 7.0 mm ?Number of attempts: 1 ?Airway Equipment and Method: Stylet ?Placement Confirmation: ETT inserted through vocal cords under direct vision, positive ETCO2 and breath sounds checked- equal and bilateral ?Secured at: 21 cm ?Tube secured with: Tape ?Dental Injury: Teeth and Oropharynx as per pre-operative assessment  ? ? ? ? ?

## 2021-10-11 NOTE — Consult Note (Signed)
WOC Nurse Consult Note: ?Reason for Consult: sacral wound; LE cellulitis ?For BKA per vascular 10/12/21 of the LLE ?Wound type: ?Unstageable pressure injury: sacrum; right lateral: 3cm x 4cm x 0.1cm/100% soft grey/black eschar ?Unstageable pressure injury: sacrum; left lateral: 2cm x 2cm x 0.1cm/100% pink, moist ?Full thickness wound right lateral leg; 3cm x 3cm x 0.1cm; clean/pink moist ?Deep tissue pressure injury: right trochanter; 2cm x 2cm x 0cm ; 100% dark purple non blanchable intact skin  ?Pressure Injury POA: Yes ?Measurement:see above  ?Wound bed: see above  ?Drainage (amount, consistency, odor) see above  ?Periwound: intact  ?Dressing procedure/placement/frequency ?Apply 1/4" thick layer of Santyl to the sacral wound; right lateral; top with small 2x2 gauze damp (not soaking) with saline, top with silicone foam. Change daily  ? ?Apply silicone foam only to the sacral wound; left lateral; change every 3 days and PRN soilage. ? ?Discussed POC with patient and bedside nurse.  ?Re consult if needed, will not follow at this time. ?Thanks ? Tacoya Altizer [facility] MSN, RN,CWOCN, CNS, CWON-AP [phone])  ? ? ?  ?

## 2021-10-11 NOTE — Anesthesia Preprocedure Evaluation (Signed)
Anesthesia Evaluation  ?Patient identified by MRN, date of birth, ID band ?Patient awake ? ? ? ?Reviewed: ?Allergy & Precautions, NPO status , Patient's Chart, lab work & pertinent test results ? ?History of Anesthesia Complications ?Negative for: history of anesthetic complications ? ?Airway ?Mallampati: III ? ?TM Distance: >3 FB ?Neck ROM: full ? ? ? Dental ? ?(+) Poor Dentition, Missing ?  ?Pulmonary ?shortness of breath, asthma , sleep apnea , COPD,  COPD inhaler, Current Smoker and Patient abstained from smoking.,  ?  ?Pulmonary exam normal ? ? ? ? ? ? ? Cardiovascular ?hypertension, + CAD, + Peripheral Vascular Disease, +CHF and + Orthopnea  ?Normal cardiovascular exam+ dysrhythmias  ? ? ?  ?Neuro/Psych ? Headaches, Seizures -, Well Controlled,  PSYCHIATRIC DISORDERS TIA Neuromuscular disease CVA, Residual Symptoms   ? GI/Hepatic ?Neg liver ROS, GERD  Controlled,  ?Endo/Other  ?diabetes, Type 2 ? Renal/GU ?Renal disease  ? ?  ?Musculoskeletal ? ? Abdominal ?  ?Peds ? Hematology ?negative hematology ROS ?(+)   ?Anesthesia Other Findings ?Past Medical History: ?No date: Anxiety ?    Comment:  takes Prozac daily ?No date: Anxiety ?No date: Aortic valve calcification ?No date: Asthma ?    Comment:  Advair and Spirva daily ?No date: Asthma ?No date: Bipolar disorder ([place]) ?12/23/2019: Breast cancer, right breast ([place]) ?No date: CAD (coronary artery disease) ?    Comment:  a. LHC 11/2013 done for CP/fluid retention: mild disease  ?             in prox LAD, mild-mod disease in mRCA, EF 60% with normal ?             LVEDP. b. Normal nuc 03/2016. ?No date: Cancer [facility]) ?03/12/2020: Cellulitis and abscess of trunk ?No date: CHF (congestive heart failure) ([place]) ?No date: Chronic diastolic CHF (congestive heart failure) ([place]) ?11/16/2018: Chronic heart failure with preserved ejection fraction ([place]) ?No date: CKD (chronic kidney disease), stage II ?No date: COPD (chronic obstructive pulmonary  disease) ([place]) ?    Comment:  a. nocturnal O2. ?No date: COPD (chronic obstructive pulmonary disease) ([place]) ?No date: Coronary artery disease ?No date: Decreased urine stream ?No date: Diabetes mellitus ?No date: Diabetes mellitus without complication ([place]) ?No date: Dyspnea ?01/29/2018: Elevated C-reactive protein (CRP) ?01/29/2018: Elevated sedimentation rate ?01/19/2018: Elevated serum hCG ?No date: Family history of adverse reaction to anesthesia ?    Comment:  mom gets nauseated ?05/22/2018: Foot pain, bilateral ?No date: GERD (gastroesophageal reflux disease) ?    Comment:  takes Pepcid daily ?No date: History of blood clots ?    Comment:  left leg 3-32yr ago ?No date: Hyperlipidemia ?No date: Hypertension ?No date: Hypertriglyceridemia ?01/2015: Inguinal hernia, left ?No date: Muscle spasm ?No date: Open wound of genital labia ?No date: Peripheral neuropathy ?No date: RBBB ?No date: Seizures ([place] ?01/19/2018: Sepsis ([place] ?No date: Sinus tachycardia ?    Comment:  a. persistent since 2009. ?No date: Smokers' cough ([place] ?01/14/2020: Status post mastectomy, right ?1989: Stroke ([place] ?    Comment:  left sided weakness ?No date: TIA (transient ischemic attack) ?No date: Tobacco abuse ?01/23/2018: Vulvar abscess ? ?Past Surgical History: ?76/94/8546 APPLICATION OF WOUND VAC; Right ?    Comment:  Procedure: APPLICATION OF WOUND VAC;  Surgeon:  ?             RRonny Bacon MD;  Location: [facility];  Service:  ?             General;  Laterality: Right; ?11/06/2019: BREAST BIOPSY; Right ?    Comment:  [place] core path pending venus clip ?2/35/3614: GRAFT APPLICATION ?    Comment:  Procedure: GRAFT APPLICATION;  Surgeon: [name],  ?             DPM;  Location: [facility];  Service: Podiatry;; ?No date: HERNIA REPAIR; Left ?09/21/2021: INCISION AND DRAINAGE; Bilateral ?    Comment:  Procedure: INCISION AND DRAINAGE;  Surgeon: Daylene Katayama ?             M, DPM;  Location: [facility];  Service: Podiatry;   ?              Laterality: Bilateral; ?01/26/2018: INCISION AND DRAINAGE ABSCESS; N/A ?    Comment:  Procedure: INCISION AND DEBRIDEMENT OF VULVAR  ?             NECROTIZING SOFT TISSUE INFECTION;  Surgeon: Redmond Pulling,  ?             Randall Hiss, MD;  Location: [place];  Service: General;   ?             Laterality: N/A; ?07/21/2021: INCISION AND DRAINAGE ABSCESS; N/A ?    Comment:  Procedure: INCISION AND DRAINAGE ABSCESS Scalp;   ?             Surgeon: Robert Bellow, MD;  Location: [facility];   ?             Service: General;  Laterality: N/A;  scalp and right  ?             abdomen ?01/22/2018: INCISION AND DRAINAGE PERIRECTAL ABSCESS; N/A ?    Comment:  Procedure: IRRIGATION AND DEBRIDEMENT LABIAL/VULVAR  ?             AREA;  Surgeon: Coralie Keens, MD;  Location: [place];  ?             Service: General;  Laterality: N/A; ?01/29/2018: INCISION AND DRAINAGE PERIRECTAL ABSCESS; N/A ?    Comment:  Procedure: IRRIGATION AND DEBRIDEMENT VULVA;  Surgeon:  ?             Excell Seltzer, MD;  Location: [place];  Service:  ?             General;  Laterality: N/A; ?04/08/2015: INGUINAL HERNIA REPAIR; Left ?    Comment:  Procedure: OPEN LEFT INGUINAL HERNIA REPAIR WITH MESH;   ?             Surgeon: Ralene Ok, MD;  Location: [place];  Service: ?             General;  Laterality: Left; ?04/08/2015: INSERTION OF MESH; Left ?    Comment:  Procedure: INSERTION OF MESH;  Surgeon: Ralene Ok, ?             MD;  Location: [place];  Service: General;  Laterality:  ?             Left; ?07/21/2021: IRRIGATION AND DEBRIDEMENT ABSCESS; N/A ?    Comment:  Procedure: IRRIGATION AND DEBRIDEMENT ABSCESS abdomen;   ?             Surgeon: Robert Bellow, MD;  Location: [facility];   ?             Service: General;  Laterality: N/A; ?No date: LAPAROSCOPY ?    Comment:  Endometriosis ?12/05/2013: LEFT HEART CATHETERIZATION WITH CORONARY ANGIOGRAM;  N/A ?    Comment:  Procedure: LEFT HEART CATHETERIZATION WITH CORONARY  ?             ANGIOGRAM;  Surgeon: Jettie Booze, MD;  Location:  ?             [facility] CATH LAB;  Service: Cardiovascular;  Laterality: N/A; ?09/22/2021: LOWER EXTREMITY ANGIOGRAPHY; Left ?    Comment:  Procedure: Lower Extremity Angiography;  Surgeon:  ?             Delana Meyer Dolores Lory, MD;  Location: [place] [facility];  ?             Service: Cardiovascular;  Laterality: Left; ?No date: MASTECTOMY; Right ?No date: right kidney drained ?12/23/2019: SIMPLE MASTECTOMY WITH AXILLARY SENTINEL NODE BIOPSY; Right ?    Comment:  Procedure: Right SIMPLE MASTECTOMY WITH AXILLARY  ?             SENTINEL NODE BIOPSY;  Surgeon: Ronny Bacon, MD;   ?             Location: [facility];  Service: General;  Laterality:  ?             Right; ?11/28/2013: TEE WITHOUT CARDIOVERSION; N/A ?    Comment:  Procedure: TRANSESOPHAGEAL ECHOCARDIOGRAM (TEE);   ?             Surgeon: Thayer Headings, MD;  Location: [place];   ?             Service: Cardiovascular;  Laterality: N/A; ?01/29/2020: WOUND DEBRIDEMENT; Right ?    Comment:  Procedure: DEBRIDEMENT WOUND, Excisional debridement  ?             skin, subcutaneous and muscle right chest wall;  Surgeon: ?             Ronny Bacon, MD;  Location: [facility];  Service:  ?             General;  Laterality: Right; ? ?BMI   ? Body Mass Index: 39.48 kg/m?  ?  ? ? Reproductive/Obstetrics ?negative OB ROS ? ?  ? ? ? ? ? ? ? ? ? ? ? ? ? ?  ?  ? ? ? ? ? ? ? ? ?Anesthesia Physical ?Anesthesia Plan ? ?ASA: 3 ? ?Anesthesia Plan: General ETT  ? ?Post-op Pain Management:   ? ?Induction: Intravenous ? ?PONV Risk Score and Plan: Ondansetron, Dexamethasone, Midazolam and Treatment may vary due to age or medical condition ? ?Airway Management Planned: Oral ETT ? ?Additional Equipment:  ? ?Intra-op Plan:  ? ?Post-operative Plan: Extubation in OR ? ?Informed Consent: I have reviewed the patients History and Physical, chart, labs and discussed the procedure including the risks, benefits and alternatives for the proposed anesthesia with the  patient or authorized representative who has indicated his/her understanding and acceptance.  ? ? ? ?Dental Advisory Given ? ?Plan Discussed with: Anesthesiologist, CRNA and Surgeon ? ?Anesthesia Plan Comments: (Pa

## 2021-10-12 ENCOUNTER — Encounter: Payer: Self-pay | Admitting: Vascular Surgery

## 2021-10-12 DIAGNOSIS — L03116 Cellulitis of left lower limb: Secondary | ICD-10-CM | POA: Diagnosis not present

## 2021-10-12 LAB — BASIC METABOLIC PANEL
Anion gap: 10 (ref 5–15)
BUN: 33 mg/dL — ABNORMAL HIGH (ref 6–20)
CO2: 25 mmol/L (ref 22–32)
Calcium: 8.1 mg/dL — ABNORMAL LOW (ref 8.9–10.3)
Chloride: 103 mmol/L (ref 98–111)
Creatinine, Ser: 1 mg/dL (ref 0.44–1.00)
GFR, Estimated: >60 mL/min (ref >60)
Glucose, Bld: 399 mg/dL — ABNORMAL HIGH (ref 70–99)
Potassium: 5.4 mmol/L — ABNORMAL HIGH (ref 3.5–5.1)
Sodium: 138 mmol/L (ref 135–145)

## 2021-10-12 LAB — CBC WITH DIFFERENTIAL/PLATELET
Abs Immature Granulocytes: 0.42 10*3/uL — ABNORMAL HIGH (ref 0.00–0.07)
Basophils Absolute: 0 10*3/uL (ref 0.0–0.1)
Basophils Relative: 0 %
Eosinophils Absolute: 0 10*3/uL (ref 0.0–0.5)
Eosinophils Relative: 0 %
HCT: 29.3 % — ABNORMAL LOW (ref 36.0–46.0)
Hemoglobin: 8.8 g/dL — ABNORMAL LOW (ref 12.0–15.0)
Immature Granulocytes: 3 %
Lymphocytes Relative: 4 %
Lymphs Abs: 0.7 10*3/uL (ref 0.7–4.0)
MCH: 25.5 pg — ABNORMAL LOW (ref 26.0–34.0)
MCHC: 30 g/dL (ref 30.0–36.0)
MCV: 84.9 fL (ref 80.0–100.0)
Monocytes Absolute: 0.3 10*3/uL (ref 0.1–1.0)
Monocytes Relative: 2 %
Neutro Abs: 14.1 10*3/uL — ABNORMAL HIGH (ref 1.7–7.7)
Neutrophils Relative %: 91 %
Platelets: 286 10*3/uL (ref 150–400)
RBC: 3.45 MIL/uL — ABNORMAL LOW (ref 3.87–5.11)
RDW: 15.5 % (ref 11.5–15.5)
WBC: 15.5 10*3/uL — ABNORMAL HIGH (ref 4.0–10.5)
nRBC: 0 % (ref 0.0–0.2)

## 2021-10-12 LAB — GLUCOSE, CAPILLARY
Glucose-Capillary: 373 mg/dL — ABNORMAL HIGH (ref 70–99)
Glucose-Capillary: 398 mg/dL — ABNORMAL HIGH (ref 70–99)
Glucose-Capillary: 445 mg/dL — ABNORMAL HIGH (ref 70–99)
Glucose-Capillary: 433 mg/dL — ABNORMAL HIGH (ref 70–99)

## 2021-10-12 LAB — MAGNESIUM: Magnesium: 2.5 mg/dL — ABNORMAL HIGH (ref 1.7–2.4)

## 2021-10-12 MED ORDER — INSULIN ASPART 100 UNIT/ML IJ SOLN
25.0000 [IU] | Freq: Once | INTRAMUSCULAR | Status: AC
  Administered 2021-10-12: 25 [IU] via SUBCUTANEOUS
  Filled 2021-10-12: qty 1

## 2021-10-12 MED ORDER — SODIUM ZIRCONIUM CYCLOSILICATE 10 G PO PACK
10.0000 g | PACK | Freq: Once | ORAL | Status: AC
  Administered 2021-10-12: 10 g via ORAL
  Filled 2021-10-12: qty 1

## 2021-10-12 MED ORDER — SPIRONOLACTONE 25 MG PO TABS
50.0000 mg | ORAL_TABLET | Freq: Every day | ORAL | Status: DC
Start: 2021-10-13 — End: 2021-10-14
  Administered 2021-10-13 – 2021-10-14 (×2): 50 mg via ORAL
  Filled 2021-10-12 (×2): qty 2

## 2021-10-12 MED ORDER — INSULIN REGULAR HUMAN 100 UNIT/ML IJ SOLN: 6.0000 [IU] | Freq: Three times a day (TID) | INTRAMUSCULAR | Status: DC

## 2021-10-12 MED ORDER — INSULIN ASPART 100 UNIT/ML IJ SOLN
6.0000 [IU] | Freq: Three times a day (TID) | INTRAMUSCULAR | Status: DC
  Administered 2021-10-12 – 2021-10-14 (×7): 6 [IU] via SUBCUTANEOUS
  Filled 2021-10-12 (×7): qty 1

## 2021-10-12 NOTE — Plan of Care (Signed)
?  Problem: Clinical Measurements: ?Goal: Ability to avoid or minimize complications of infection will improve ?Outcome: Not Progressing ?  ?Problem: Skin Integrity: ?Goal: Skin integrity will improve ?Outcome: Not Progressing ?  ?

## 2021-10-12 NOTE — Progress Notes (Signed)
[place] at [facility] ? ? ?PATIENT NAME: [name]   ? ?MR#:  [id number] ? ?DATE OF BIRTH:  03-Apr-1971 ? ?SUBJECTIVE:  ? ?working with physical therapy. Underwent left BKA yesterday. Husband at bedside. Discussed TEE procedure with patient and husband. Now in agreement to proceed. ? ? ?VITALS:  ?Blood pressure (!) 143/62, pulse 89, temperature 98.2 ?F (36.8 ?C), resp. rate 18, height _0  (1.626 m), weight 104.3 kg, SpO2 100 %. ? ?PHYSICAL EXAMINATION:  ? ?GENERAL:  51 y.o.-year-old patient lying in the bed with no acute distress.  ?LUNGS: Normal breath sounds bilaterally, no wheezing, rales, rhonchi.  ?CARDIOVASCULAR: S1, S2 normal. No murmurs, rubs, or gallops.  ?ABDOMEN: Soft, nontender, nondistended. Bowel sounds present.  ?EXTREMITIES: Left BKA + ?NEUROLOGIC: nonfocal  patient is alert and awake ?SKIN: No obvious rash, lesion, or ulcer.  ? ?LABORATORY PANEL:  ?CBC ?Recent Labs  ?Lab 10/12/21 ?8466  ?WBC 15.5*  ?HGB 8.8*  ?HCT 29.3*  ?PLT 286  ? ? ?Chemistries  ?Recent Labs  ?Lab 10/12/21 ?5993  ?NA 138  ?K 5.4*  ?CL 103  ?CO2 25  ?GLUCOSE 399*  ?BUN 33*  ?CREATININE 1.00  ?CALCIUM 8.1*  ?MG 2.5*  ? ? ?Assessment and Plan ? ?[name] is a 51 yo female with PMH PVD, HTN, HLD, DMII, COPD, asthma, CVA, GERD, depression/anxiety, tobacco abuse, seizure d/o, anemia, CAD, CKD3a, dCHF, right breast cancer (s/p mastectomy) who presented with worsening pain and swelling in her left foot and a nonhealing wound. ?  ?She was recently hospitalized from 09/20/2021 until 09/23/2021 for a diabetic infection involving her left foot and she had undergone debridement on 09/21/2021 with podiatry in the OR.  She also had vascular surgery evaluation with abdominal aortogram and left lower extremity angiogram (no significant stenosis/no intervention required).  ? ?Left lower extremity gangrene/cellulitis ?-- concern for healing of wound poor. Vascular consultation was obtained with Dr. Lucky Cowboy. Patient is  status post left BKA 10/11/2021 ?-- initiated ?-- TOC for discharge planning ? ?M RSA bacteremia ?-- infectious disease consultation appreciated ?-- TTE negative for vegetation ?-- TEE pending.  will be scheduled by Dr. Nehemiah Massed. Discussed with husband and he is in agreement. Patient was initially hesitant ?-- continue IV vancomycin ? ?PAD ?--underwent angiogram of abdomen and left lower extremity on 09/22/2021 with no significant stenosis nor stenting required ?-Continue aspirin and Crestor ? ?chronic diastolic heart failure ?-- stable ?-- no signs symptoms of exacerbation ?-- continue aspirin and Spironolactone ? ?Breast cancer ([place]) ?- Continue anastrozole ?- s/p right mastectomy  ?  ?CAD (coronary artery disease) ?Continue aspirin and Crestor ?  ?Chronic kidney disease, stage 3a ([place]) ?- patient has history of CKD3a. Baseline creat ~ 1 - 1.1, eGFR 50closely ?  ?Bipolar disorder ([place]) ?Continue trazodone, Pristiq, buspirone and Celexa ?  ?COPD with chronic bronchitis ([place]) ?Not acutely exacerbated ?Continue as needed bronchodilator therapy as well as inhaled steroids ?  ?Tobacco abuse ?Smoking cessation discussed with patient ?-Continue nicotine patch ? ?Procedures: left BKA 10/11/2021 ?Family communication :Husband at bedside ?Consults : podiatry, vascular, ID, cardiology ?CODE STATUS: full ?DVT Prophylaxis : enoxaparin ?Level of care: Med-Surg ?Status is: Inpatient ?Remains inpatient appropriate because: pending TEE, rehab workup per TOC ?  ?discharge 1 to 2 days once above workup is completed and rehab bed up available. ? ?TOTAL TIME TAKING CARE OF THIS PATIENT: 25 minutes.  ?>50% time spent on counselling and coordination of care ? ?Note: This dictation was prepared  with Dragon dictation along with smaller phrase technology. Any transcriptional errors that result from this process are unintentional. ? ?[name] M.D  ? ? ?[facility]  ? ?CC: ?Primary care physician; Pleas Koch, NP  ?

## 2021-10-12 NOTE — Evaluation (Signed)
Physical Therapy Evaluation ?Patient Details ?Name: [name] ?MRN: [id number] ?DOB: [birth year] ?Today's Date: 10/12/2021 ? ?History of Present Illness ? Pt is a 51 yo female with PMH PVD, HTN, HLD, DMII, COPD, asthma, CVA, GERD, depression/anxiety, tobacco abuse, seizure d/o, anemia, CAD, CKD3a, dCHF, right breast cancer (s/p mastectomy) who presented with a left foot ulcer/infection and BLE lower leg pain.  Pt diagnosed with atherosclerotic occlusive disease bilateral lower extremities with gangrenous changes to the left heel. Pt underwent surgical debridement with podiatry on 09/21/2021.  She then underwent abdominal aortogram and left lower extremity angiogram with vascular surgery on 09/22/2021.  Pt had R BKA performed on 10/11/21. ? ?  ?Clinical Impression ? Pt received in Semi-Fowler's position and agreeable to therapy.  Upon entering and at beginning of session when purewick was removed, it was evident the pt had soiled the bed.  The entire bed was soiled upon arrival, nursing called and assisted with bed mobility and changing of linens and peri-care of the pt.  Pt then transferred to sitting EOB where she performed bed-level exercises prior to attempting to stand.  Pt required +2 max A on multiple attempts, but was unable to clear buttocks completely with use of B UE on the RW.  Pt was then transferred back to bed with all needs met and call bell within reach.  Current discharge plans to SNF remain appropriate at this time.  Pt will continue to benefit from skilled therapy in order to address deficits listed below. ? ?   ? ?Recommendations for follow up therapy are one component of a multi-disciplinary discharge planning process, led by the attending physician.  Recommendations may be updated based on patient status, additional functional criteria and insurance authorization. ? ?Follow Up Recommendations Skilled nursing-short term rehab (<3 hours/day) ? ?  ?Assistance Recommended at Discharge Frequent or  constant Supervision/Assistance  ?Patient can return home with the following ? Two people to help with walking and/or transfers;Two people to help with bathing/dressing/bathroom;Assistance with cooking/housework;Assist for transportation;Help with stairs or ramp for entrance ? ?  ?Equipment Recommendations Rolling walker (2 wheels);BSC/3in1;Wheelchair cushion (measurements PT)  ?Recommendations for Other Services ?    ?  ?Functional Status Assessment Patient has had a recent decline in their functional status and demonstrates the ability to make significant improvements in function in a reasonable and predictable amount of time.  ? ?  ?Precautions / Restrictions Restrictions ?Weight Bearing Restrictions: No  ? ?  ? ?Mobility ? Bed Mobility ?Overal bed mobility: Needs Assistance ?Bed Mobility: Supine to Sit ?  ?  ?Supine to sit: HOB elevated ?  ?  ?General bed mobility comments: use of bedrails and HOB being elevated ?  ? ?Transfers ?Overall transfer level: Needs assistance ?Equipment used: Rolling walker (2 wheels) ?Transfers: Sit to/from Stand ?Sit to Stand: +2 physical assistance, Max assist, +2 safety/equipment, From elevated surface ?  ?  ?  ?  ?  ?General transfer comment: Pt unable to perform with +2 maxA ?  ? ?Ambulation/Gait ?  ?  ?  ?  ?  ?  ?  ?General Gait Details: declined due to inability to stand with +2 maxA and use of RW. ? ?Stairs ?  ?  ?  ?  ?  ? ?Wheelchair Mobility ?  ? ?Modified Rankin (Stroke Patients Only) ?  ? ?  ? ?Balance Overall balance assessment: Needs assistance ?  ?Sitting balance-Leahy Scale: Normal ?  ?  ?Standing balance support: Bilateral upper extremity supported, During functional activity,  Reliant on assistive device for balance ?Standing balance-Leahy Scale: Good ?  ?  ?  ?  ?  ?  ?  ?  ?  ?  ?  ?  ?   ? ? ? ?Pertinent Vitals/Pain Pain Assessment ?Pain Assessment: No/denies pain  ? ? ?Home Living Family/patient expects to be discharged to:: Private residence ?Living  Arrangements: Spouse/significant other;Children ?Available Help at Discharge: Family;Available 24 hours/day ?Type of Home: House ?Home Access: Ramped entrance ?  ?  ?  ?Home Layout: One level ?Home Equipment: Rollator (4 wheels);Cane - single point ?   ?  ?Prior Function Prior Level of Function : Needs assist ?  ?  ?  ?  ?  ?  ?Mobility Comments: Mod Ind amb community distances with a SPC, 2 falls in the last 6 months secondary to LOB ?ADLs Comments: Min A with bathing and dressing, mostly sponge bathes ?  ? ? ?Hand Dominance  ? Dominant Hand: Right ? ?  ?Extremity/Trunk Assessment  ? Upper Extremity Assessment ?Upper Extremity Assessment: Generalized weakness ?  ? ?Lower Extremity Assessment ?Lower Extremity Assessment: Generalized weakness ?  ? ?   ?Communication  ? Communication: No difficulties  ?Cognition Arousal/Alertness: Awake/alert ?Behavior During Therapy: [facility] for tasks assessed/performed ?Overall Cognitive Status: Within Functional Limits for tasks assessed ?  ?  ?  ?  ?  ?  ?  ?  ?  ?  ?  ?  ?  ?  ?  ?  ?  ?  ?  ? ?  ?General Comments   ? ?  ?Exercises Total Joint Exercises ?Ankle Circles/Pumps: AROM, Strengthening, Right, 10 reps, Supine ?Quad Sets: AROM, Strengthening, Right, 10 reps, Supine ?Gluteal Sets: AROM, Strengthening, Both, 10 reps, Supine ?Long Arc Quad: AROM, Strengthening, Right, 10 reps, Seated ?Marching in Standing: AROM, Strengthening, Both, 10 reps, Seated ?Other Exercises ?Other Exercises: Pt and husband educated on roles of PT and services provided during hospital stay.  Pt also educated on d/c recommendations and transfer techniques.  ? ?Assessment/Plan  ?  ?PT Assessment Patient needs continued PT services  ?PT Problem List Decreased strength;Decreased activity tolerance;Decreased balance;Decreased mobility;Decreased knowledge of use of DME;Decreased knowledge of precautions ? ?   ?  ?PT Treatment Interventions DME instruction;Gait training;Functional mobility training;Therapeutic  activities;Therapeutic exercise;Balance training;Patient/family education   ? ?PT Goals (Current goals can be found in the Care Plan section)  ?Acute Rehab PT Goals ?Patient Stated Goal: To get back home ?PT Goal Formulation: With patient/family ?Time For Goal Achievement: 10/26/21 ?Potential to Achieve Goals: Fair ? ?  ?Frequency 7X/week ?  ? ? ?Co-evaluation   ?  ?  ?  ?  ? ? ?  ?AM-PAC PT "6 Clicks" Mobility  ?Outcome Measure Help needed turning from your back to your side while in a flat bed without using bedrails?: A Little ?Help needed moving from lying on your back to sitting on the side of a flat bed without using bedrails?: A Little ?Help needed moving to and from a bed to a chair (including a wheelchair)?: Total ?Help needed standing up from a chair using your arms (e.g., wheelchair or bedside chair)?: Total ?Help needed to walk in hospital room?: Total ?Help needed climbing 3-5 steps with a railing? : Total ?6 Click Score: 10 ? ?  ?End of Session Equipment Utilized During Treatment: Gait belt ?Activity Tolerance: Patient tolerated treatment well ?Patient left: with family/visitor present;in bed;with call bell/phone within reach;with bed alarm set ?Nurse Communication: Mobility status ?  PT Visit Diagnosis: Unsteadiness on feet (R26.81);Muscle weakness (generalized) (M62.81);History of falling (Z91.81);Difficulty in walking, not elsewhere classified (R26.2) ?  ? ?Time: 1112-1210 ?PT Time Calculation (min) (ACUTE ONLY): 58 min ? ? ?Charges:   PT Evaluation ?$PT Eval Low Complexity: 1 Low ?PT Treatments ?$Therapeutic Exercise: 8-22 mins ?$Therapeutic Activity: 23-37 mins ?$Self Care/Home Management: 8-22 ?  ?   ? ? ?Gwenlyn Saran, PT, DPT ?10/12/21, 1:56 PM ? ? ?[name] ?10/12/2021, 1:46 PM ? ?

## 2021-10-12 NOTE — TOC Initial Note (Signed)
Transition of Care (TOC) - Initial/Assessment Note  ? ? ?Patient Details  ?Name: [name] ?MRN: [id number] ?Date of Birth: Feb 22, 1971 ? ?Transition of Care (TOC) CM/SW Contact:    ?Kahlan Engebretson A Marcie Shearon, LCSW ?Phone Number: ?10/12/2021, 3:14 PM ? ?Clinical Narrative:   CSW spoke with pt and pt's spouse. They are agreeable to SNF at this time. Pt would prefer [facility]. CSW has sent referral to [facility] and will follow up with bed offers once available.               ? ? ?Expected Discharge Plan: [place] ?Barriers to Discharge: Continued Medical Work up ? ? ?Patient Goals and CMS Choice ?Patient states their goals for this hospitalization and ongoing recovery are:: to go to SNF ?  ?Choice offered to / list presented to : Patient, Spouse ? ?Expected Discharge Plan and Services ?Expected Discharge Plan: [place] ?In-house Referral: NA ?  ?Post Acute Care Choice: [place] ?Living arrangements for the past 2 months: [place] ?                ?  ?  ?  ?  ?  ?  ?  ?  ?  ?  ? ?Prior Living Arrangements/Services ?Living arrangements for the past 2 months: [place] ?Lives with:: Spouse ?Patient language and need for interpreter reviewed:: Yes ?Do you feel safe going back to the place where you live?: Yes      ?Need for Family Participation in Patient Care: Yes (Comment) ?Care giver support system in place?: Yes (comment) ?  ?Criminal Activity/Legal Involvement Pertinent to Current Situation/Hospitalization: No - Comment as needed ? ?Activities of Daily Living ?Home Assistive Devices/Equipment: Gilford Rile (specify type) ?ADL Screening (condition at time of admission) ?Patient's cognitive ability adequate to safely complete daily activities?: Yes ?Is the patient deaf or have difficulty hearing?: No ?Does the patient have difficulty seeing, even when wearing glasses/contacts?: No ?Does the patient have difficulty concentrating, remembering, or making decisions?:  No ?Patient able to express need for assistance with ADLs?: Yes ?Does the patient have difficulty dressing or bathing?: No ?Independently performs ADLs?: Yes (appropriate for developmental age) ?Does the patient have difficulty walking or climbing stairs?: Yes ?Weakness of Legs: None ?Weakness of Arms/Hands: None ? ?Permission Sought/Granted ?Permission sought to share information with : Family Supports ?Permission granted to share information with : Yes, Verbal Permission Granted ? Share Information with NAME: [facility] [place] ?   ? Permission granted to share info w Relationship: spouse ?   ? ?Emotional Assessment ?Appearance:: Appears stated age ?Attitude/Demeanor/Rapport: Engaged ?Affect (typically observed): Accepting ?Orientation: : Oriented to Self, Oriented to Place, Oriented to  Time, Oriented to Situation ?Alcohol / Substance Use: Not Applicable ?Psych Involvement: No (comment) ? ?Admission diagnosis:  Cellulitis [L03.90] ?Weakness [R53.1] ?Cellulitis of left lower extremity [L03.116] ?Sepsis, due to unspecified organism, unspecified whether acute organ dysfunction present ([place]) [A41.9] ?Patient Active Problem List  ? Diagnosis Date Noted  ? Bacteremia 10/08/2021  ? Allergic reaction 10/08/2021  ? Cellulitis 10/07/2021  ? Chronic foot ulcer with necrosis of muscle, left ([place]) 10/07/2021  ? Left foot infection 09/20/2021  ? HTN (hypertension) 09/20/2021  ? Diabetes mellitus without complication ([place]) 32/67/1245  ? Chronic kidney disease, stage 3a ([place]) 09/20/2021  ? History of CVA (cerebrovascular accident) 09/20/2021  ? Depression with anxiety 09/20/2021  ? CAD (coronary artery disease) 09/20/2021  ? Breast cancer ([place]) 09/20/2021  ? Diabetic infection of left foot ([place]) 09/20/2021  ?  Cellulitis of lower extremity 09/20/2021  ? Hematoma 07/13/2021  ? MRSA infection 07/09/2021  ? Abscess 07/09/2021  ? Cellulitis of finger of left hand 06/23/2021  ? Folliculitis 72/03/4708  ? Headache disorder 03/08/2021  ? Dysuria  03/05/2021  ? Pressure sore 01/29/2021  ? Hyperlipidemia associated with type 2 diabetes mellitus ([place]) 01/13/2021  ? Fatigue 12/01/2020  ? Acute on chronic diastolic heart failure ([place]) 11/16/2020  ? Chronic migraine without aura without status migrainosus, not intractable 10/01/2020  ? Dizziness 10/01/2020  ? Microscopic hematuria 07/01/2020  ? Pulmonary nodules 07/01/2020  ? Hav (hallux abducto valgus), unspecified laterality 06/11/2020  ? Venous ulcer of ankle, left ([place]) 05/15/2020  ? Contracture of hand joint 05/15/2020  ? Acute kidney injury superimposed on CKD ([place])   ? Hyperlipidemia   ? Carcinoma of lower-outer quadrant of right breast in female, estrogen receptor positive ([place]) 11/12/2019  ? Restrictive lung disease 10/08/2019  ? Postmenopausal bleeding 08/30/2019  ? Carotid stenosis, asymptomatic, right 08/16/2019  ? Nausea and vomiting 07/15/2019  ? Cellulitis of left lower extremity 06/04/2019  ? Multiple wounds of skin 06/04/2019  ? Other injury of unspecified body region, initial encounter 06/04/2019  ? Left-sided weakness 09/13/2018  ? Weakness of left lower extremity 09/05/2018  ? Recurrent falls 09/05/2018  ? PAD (peripheral artery disease) ([place]) 08/27/2018  ? Chronic congestive heart failure ([place]) 08/02/2018  ? Vaginal itching 06/15/2018  ? Chronic upper extremity pain (Secondary Area of Pain) (Bilateral) 04/23/2018  ? Diabetic polyneuropathy associated with type 2 diabetes mellitus ([place]) 04/23/2018  ? Long term prescription benzodiazepine use 04/23/2018  ? Neurogenic pain 04/23/2018  ? Vitamin D insufficiency 01/29/2018  ? Uncontrolled type 2 diabetes mellitus with hyperglycemia, with long-term current use of insulin ([place]) 01/23/2018  ? DNR (do not resuscitate) 01/23/2018  ? CKD stage 3 due to type 2 diabetes mellitus ([place]) 01/23/2018  ? IBS (irritable bowel syndrome) 01/19/2018  ? Chronic lower extremity pain (Primary Area of Pain) (Bilateral) 01/08/2018  ? Chronic low back pain [facility] Area  of Pain) (Bilateral) w/ sciatica (Bilateral) 01/08/2018  ? Chronic pain syndrome 01/08/2018  ? Pharmacologic therapy 01/08/2018  ? Disorder of skeletal system 01/08/2018  ? Problems influencing health status 01/08/2018  ? Long term current use of opiate analgesic 01/08/2018  ? Restless leg syndrome 08/02/2017  ? Nocturnal hypoxemia 03/29/2017  ? Vision disturbance 02/28/2017  ? CKD (chronic kidney disease), stage II 02/28/2017  ? Visual loss, bilateral   ? Chronic respiratory failure with hypoxia (HCC)/ nocturnal 02 dep  10/28/2016  ? Upper airway cough syndrome 08/22/2016  ? Cigarette smoker 06/15/2016  ? Simple chronic bronchitis ([place])   ? Coronary artery disease involving native heart without angina pectoris 05/16/2016  ? Chronic diastolic CHF (congestive heart failure) ([place]) 11/04/2015  ? Orthopnea 11/03/2015  ? Bilateral leg edema   ? Diabetes ([place])   ? COPD exacerbation ([place]) 10/15/2015  ? Fracture of rib, closed 10/15/2015  ? Venous stasis of both lower extremities 03/29/2015  ? Preventative health care 02/04/2015  ? Hypertension associated with diabetes ([place]) 01/02/2015  ? Inguinal hernia 11/27/2014  ? Allergic rhinitis with postnasal drip 01/21/2014  ? Aortic valve disorders 04/18/2013  ? Sleep apnea 05/22/2012  ? Syncope and collapse 10/09/2011  ? Seizure disorder ([place]) 10/09/2011  ? Bipolar disorder ([place]) 10/09/2011  ? TIA (transient ischemic attack) 06/22/2011  ? Constipation 06/15/2011  ? HYPERTRIGLYCERIDEMIA 07/17/2007  ? Situational anxiety 05/30/2007  ? COPD with chronic bronchitis ([place]) 05/30/2007  ? Morbid (  severe) obesity due to excess calories ([place]) 04/23/2007  ? Tobacco abuse 09/07/2006  ? ?PCP:  Pleas Koch, NP ?Pharmacy:   ?[facility] - [place], [place] - [address], [address] ?[address] ?[place] [address] ?Phone: [phone] Fax: [phone] ? ?[place], [place] ?[place] ?[place] [address] ?Phone: [phone] Fax: [phone] ? ?[employer], [place] ?[address] ?[place] [address] ?Phone: [phone] Fax: [phone] ? ?

## 2021-10-12 NOTE — Consult Note (Signed)
? ?[facility] Card[facility] Consultation Note  ?Patient ID: [name], MRN: [id number], DOB/AGE: 53/10/1970 53 y.o. ?Admit date: 10/07/2021   Date of Consult: 10/12/2021 ?Primary Physician: Pleas Koch, NP ?Primary Cardiologist: None ? ?Chief Complaint:  ?Chief Complaint  ?Patient presents with  ?? Weakness  ? ?Reason for Consult:  Bacteremia ? ?HPI: 51 y.o. female with known coronary artery disease chronic kidney disease this stage III with a glomerular filtration rate of 50 and significant peripheral vascular disease due to tobacco abuse having a left BKA and MRSA bacteremia.  The patient has successfully gone a left BKA with no evidence of significant complication.  With her bacteremia there was concerns of possibility of endocarditis as a cause and/or needing further treatment.  We have discussed at length family about further investigation of using transesophageal echocardiogram.  Patient understands risk and benefits of transesophageal echocardiogram ? ?Past Medical History:  ?Diagnosis Date  ?? Anxiety   ? takes Prozac daily  ?? Anxiety   ?? Aortic valve calcification   ?? Asthma   ? Advair and Spirva daily  ?? Asthma   ?? Bipolar disorder ([place])   ?? Breast cancer, right breast ([place]) 12/23/2019  ?? CAD (coronary artery disease)   ? a. LHC 11/2013 done for CP/fluid retention: mild disease in prox LAD, mild-mod disease in mRCA, EF 60% with normal LVEDP. b. Normal nuc 03/2016.  ?? Cancer ([place])   ?? Cellulitis and abscess of trunk 03/12/2020  ?? CHF (congestive heart failure) ([place])   ?? Chronic diastolic CHF (congestive heart failure) ([place])   ?? Chronic heart failure with preserved ejection fraction ([place]) 11/16/2018  ?? CKD (chronic kidney disease), stage II   ?? COPD (chronic obstructive pulmonary disease) ([place])   ? a. nocturnal O2.  ?? COPD (chronic obstructive pulmonary disease) ([place])   ?? Coronary artery disease   ?? Decreased urine stream   ?? Diabetes mellitus   ?? Diabetes mellitus without complication  ([place])   ?? Dyspnea   ?? Elevated C-reactive protein (CRP) 01/29/2018  ?? Elevated sedimentation rate 01/29/2018  ?? Elevated serum hCG 01/19/2018  ?? Family history of adverse reaction to anesthesia   ? mom gets nauseated  ?? Foot pain, bilateral 05/22/2018  ?? GERD (gastroesophageal reflux disease)   ? takes Pepcid daily  ?? History of blood clots   ? left leg 3-31yr ago  ?? Hyperlipidemia   ?? Hypertension   ?? Hypertriglyceridemia   ?? Inguinal hernia, left 01/2015  ?? Muscle spasm   ?? Open wound of genital labia   ?? Peripheral neuropathy   ?? RBBB   ?? Seizures ([place]   ?? Sepsis ([place] 01/19/2018  ?? Sinus tachycardia   ? a. persistent since 2009.  ?? Smokers' cough ([place]   ?? Status post mastectomy, right 01/14/2020  ?? Stroke ([facility]  ? left sided weakness  ?? TIA (transient ischemic attack)   ?? Tobacco abuse   ?? Vulvar abscess 01/23/2018  ?   ? ?Surgical History:  ?Past Surgical History:  ?Procedure Laterality Date  ?? AMPUTATION Left 10/11/2021  ? Procedure: AMPUTATION BELOW KNEE;  Surgeon: DAlgernon Huxley MD;  Location: [facility] ORS;  Service: General;  Laterality: Left;  ?? APPLICATION OF WOUND VAC Right 01/29/2020  ? Procedure: APPLICATION OF WOUND VAC;  Surgeon: RRonny Bacon MD;  Location: [facility];  Service: General;  Laterality: Right;  ?? BREAST BIOPSY Right 11/06/2019  ? uKoreacore path pending venus clip  ?? GRAFT APPLICATION  31/63/8453 ?  Procedure: GRAFT APPLICATION;  Surgeon: [name], DPM;  Location: [facility];  Service: Podiatry;;  ?? HERNIA REPAIR Left   ?? INCISION AND DRAINAGE Bilateral 09/21/2021  ? Procedure: INCISION AND DRAINAGE;  Surgeon: [name], DPM;  Location: [facility];  Service: Podiatry;  Laterality: Bilateral;  ?? INCISION AND DRAINAGE ABSCESS N/A 01/26/2018  ? Procedure: INCISION AND DEBRIDEMENT OF VULVAR NECROTIZING SOFT TISSUE INFECTION;  Surgeon: Greer Pickerel, MD;  Location: [facility];  Service: General;  Laterality: N/A;  ?? INCISION AND DRAINAGE ABSCESS N/A 07/21/2021  ?  Procedure: INCISION AND DRAINAGE ABSCESS Scalp;  Surgeon: Robert Bellow, MD;  Location: [facility];  Service: General;  Laterality: N/A;  scalp and right abdomen  ?? INCISION AND DRAINAGE PERIRECTAL ABSCESS N/A 01/22/2018  ? Procedure: IRRIGATION AND DEBRIDEMENT LABIAL/VULVAR AREA;  Surgeon: Coralie Keens, MD;  Location: [facility];  Service: General;  Laterality: N/A;  ?? INCISION AND DRAINAGE PERIRECTAL ABSCESS N/A 01/29/2018  ? Procedure: IRRIGATION AND DEBRIDEMENT VULVA;  Surgeon: Excell Seltzer, MD;  Location: [facility];  Service: General;  Laterality: N/A;  ?? INGUINAL HERNIA REPAIR Left 04/08/2015  ? Procedure: OPEN LEFT INGUINAL HERNIA REPAIR WITH MESH;  Surgeon: Ralene Ok, MD;  Location: [facility];  Service: General;  Laterality: Left;  ?? INSERTION OF MESH Left 04/08/2015  ? Procedure: INSERTION OF MESH;  Surgeon: Ralene Ok, MD;  Location: [facility];  Service: General;  Laterality: Left;  ?? IRRIGATION AND DEBRIDEMENT ABSCESS N/A 07/21/2021  ? Procedure: IRRIGATION AND DEBRIDEMENT ABSCESS abdomen;  Surgeon: Robert Bellow, MD;  Location: [facility];  Service: General;  Laterality: N/A;  ?? LAPAROSCOPY    ? Endometriosis  ?? LEFT HEART CATHETERIZATION WITH CORONARY ANGIOGRAM N/A 12/05/2013  ? Procedure: LEFT HEART CATHETERIZATION WITH CORONARY ANGIOGRAM;  Surgeon: Jettie Booze, MD;  Location: [facility];  Service: Cardiovascular;  Laterality: N/A;  ?? LOWER EXTREMITY ANGIOGRAPHY Left 09/22/2021  ? Procedure: Lower Extremity Angiography;  Surgeon: Katha Cabal, MD;  Location: [facility] CV LAB;  Service: Cardiovascular;  Laterality: Left;  ?? MASTECTOMY Right   ?? right kidney drained    ?? SIMPLE MASTECTOMY WITH AXILLARY SENTINEL NODE BIOPSY Right 12/23/2019  ? Procedure: Right SIMPLE MASTECTOMY WITH AXILLARY SENTINEL NODE BIOPSY;  Surgeon: Ronny Bacon, MD;  Location: [facility];  Service: General;  Laterality: Right;  ?? TEE WITHOUT CARDIOVERSION N/A 11/28/2013  ? Procedure:  TRANSESOPHAGEAL ECHOCARDIOGRAM (TEE);  Surgeon: Thayer Headings, MD;  Location: [facility];  Service: Cardiovascular;  Laterality: N/A;  ?? WOUND DEBRIDEMENT Right 01/29/2020  ? Procedure: DEBRIDEMENT WOUND, Excisional debridement skin, subcutaneous and muscle right chest wall;  Surgeon: Ronny Bacon, MD;  Location: [facility];  Service: General;  Laterality: Right;  ?  ? ?Home Meds: ?Prior to Admission medications   ?Medication Sig Start Date End Date Taking? Authorizing Provider  ?ALPRAZolam (XANAX) 1 MG tablet Take 1 mg by mouth 4 (four) times daily as needed for anxiety.  12/02/19  Yes [provider]  ?anastrozole (ARIMIDEX) 1 MG tablet Take 1 tablet (1 mg total) by mouth daily. 03/02/21  Yes Cammie Sickle, MD  ?aspirin EC 81 MG tablet Take 81 mg by mouth daily before breakfast.   Yes [provider]  ?budesonide-formoterol (SYMBICORT) 80-4.5 MCG/ACT inhaler INHALE 2 PUFFS BY MOUTH EVERY 12 HOURS TO PREVENT COUGH OR WHEEZING *RINSE MOUTH AFTER EACH USE* 12/01/20  Yes Pleas Koch, NP  ?busPIRone (BUSPAR) 30 MG tablet Take 30 mg by mouth 2 (  two) times daily.   Yes [provider]  ?citalopram (CELEXA) 20 MG tablet Take 1 tablet (20 mg total) by mouth daily. 12/17/19  Yes Cammie Sickle, MD  ?desvenlafaxine (PRISTIQ) 100 MG 24 hr tablet Take 100 mg by mouth daily.   Yes [provider]  ?Empagliflozin-metFORMIN HCl ER 25-1000 MG TB24 Take 1 tablet by mouth daily with breakfast. 12/03/20 12/03/21 Yes [provider]  ?gentamicin cream (GARAMYCIN) 0.1 % Apply 1 application. topically daily. 09/13/21  Yes McDonald, Stephan Minister, DPM  ?insulin aspart (NOVOLOG) 100 UNIT/ML FlexPen Inject 1-9 Units into the skin 4 (four) times daily -  before meals and at bedtime. Glucose 121 - 150: 1 unit, Glucose 151 - 200: 2 units, Glucose 201 - 250: 3 units, Glucose 251 - 300: 5 units, Glucose 301 - 350: 7 units, Glucose 351 - 400: 9 units, Glucose > 400 call MD 09/23/21  Yes  Dwyane Dee, MD  ?insulin glargine (LANTUS) 100 UNIT/ML Solostar Pen Inject 25 Units into the skin daily. 09/23/21  Yes Dwyane Dee, MD  ?mupirocin ointment (BACTROBAN) 2 % Apply 1 application topically 2 (two) times

## 2021-10-12 NOTE — Progress Notes (Signed)
Inpatient Diabetes Program Recommendations ? ?AACE/ADA: New Consensus Statement on Inpatient Glycemic Control (2015) ? ?Target Ranges:  Prepandial:   less than 140 mg/dL ?     Peak postprandial:   less than 180 mg/dL (1-2 hours) ?     Critically ill patients:  140 - 180 mg/dL  ? ? Latest Reference Range & Units 10/11/21 08:39 10/11/21 11:39 10/11/21 14:36 10/11/21 17:52 10/11/21 20:24 10/12/21 07:33  ?Glucose-Capillary 70 - 99 mg/dL 271 (H) 286 (H) 216 (H) 225 (H) 247 (H) 373 (H)  ? ?Home DM Meds: Lantus 25 units Daily ?                              Novolog 1-9 units QID per SSI ?                              Synjardy 25/1000 mg daily ? ?Current Orders: Semglee 35 units Daily  ?Novolog 0-15 units TID ?Novolog 6 units tid meal coverage ? ?Note pt received Decadron 10 mg during surgery  ? ?Please consider: ? ?-  Increase Semglee to 42 units ? ? ?--Will follow patient during hospitalization-- ? ?Tama Headings RN, MSN, BC-ADM ?Inpatient Diabetes Coordinator ?Team Pager [phone] (8a-5p) ?

## 2021-10-12 NOTE — NC FL2 (Addendum)
?[place] MEDICAID FL2 LEVEL OF CARE SCREENING TOOL  ?  ? ?IDENTIFICATION  ?Patient Name: ?[name] Birthdate: [birth year] Sex: female Admission Date (Current Location): ?10/07/2021  ?[place] and [place] Number: ? [place] ?  Facility and Address:  ?[facility], [address] [place], [place] [address] ?     Provider Number: ?[id number]  ?Attending Physician Name and Address:  ?[name], MD ? Relative Name and Phone Number:  ?  ?   ?Current Level of Care: ?Hospital Recommended Level of Care: ?[place] Prior Approval Number: ?  ? ?Date Approved/Denied: ?  PASRR Number: ? [id number] ? ?Discharge Plan: ?SNF ?  ? ?Current Diagnoses: ?Patient Active Problem List  ? Diagnosis Date Noted  ? Bacteremia 10/08/2021  ? Allergic reaction 10/08/2021  ? Cellulitis 10/07/2021  ? Chronic foot ulcer with necrosis of muscle, left ([place]) 10/07/2021  ? Left foot infection 09/20/2021  ? HTN (hypertension) 09/20/2021  ? Diabetes mellitus without complication ([place]) 47/65/4650  ? Chronic kidney disease, stage 3a ([place]) 09/20/2021  ? History of CVA (cerebrovascular accident) 09/20/2021  ? Depression with anxiety 09/20/2021  ? CAD (coronary artery disease) 09/20/2021  ? Breast cancer ([place]) 09/20/2021  ? Diabetic infection of left foot ([place]) 09/20/2021  ? Cellulitis of lower extremity 09/20/2021  ? Hematoma 07/13/2021  ? MRSA infection 07/09/2021  ? Abscess 07/09/2021  ? Cellulitis of finger of left hand 06/23/2021  ? Folliculitis 35/46/5681  ? Headache disorder 03/08/2021  ? Dysuria 03/05/2021  ? Pressure sore 01/29/2021  ? Hyperlipidemia associated with type 2 diabetes mellitus ([place]) 01/13/2021  ? Fatigue 12/01/2020  ? Acute on chronic diastolic heart failure ([place]) 11/16/2020  ? Chronic migraine without aura without status migrainosus, not intractable 10/01/2020  ? Dizziness 10/01/2020  ? Microscopic hematuria 07/01/2020  ? Pulmonary nodules 07/01/2020  ? Hav (hallux abducto valgus),  unspecified laterality 06/11/2020  ? Venous ulcer of ankle, left ([place]) 05/15/2020  ? Contracture of hand joint 05/15/2020  ? Acute kidney injury superimposed on CKD ([place])   ? Hyperlipidemia   ? Carcinoma of lower-outer quadrant of right breast in female, estrogen receptor positive ([place]) 11/12/2019  ? Restrictive lung disease 10/08/2019  ? Postmenopausal bleeding 08/30/2019  ? Carotid stenosis, asymptomatic, right 08/16/2019  ? Nausea and vomiting 07/15/2019  ? Cellulitis of left lower extremity 06/04/2019  ? Multiple wounds of skin 06/04/2019  ? Other injury of unspecified body region, initial encounter 06/04/2019  ? Left-sided weakness 09/13/2018  ? Weakness of left lower extremity 09/05/2018  ? Recurrent falls 09/05/2018  ? PAD (peripheral artery disease) ([place]) 08/27/2018  ? Chronic congestive heart failure ([place]) 08/02/2018  ? Vaginal itching 06/15/2018  ? Chronic upper extremity pain (Secondary Area of Pain) (Bilateral) 04/23/2018  ? Diabetic polyneuropathy associated with type 2 diabetes mellitus ([place]) 04/23/2018  ? Long term prescription benzodiazepine use 04/23/2018  ? Neurogenic pain 04/23/2018  ? Vitamin D insufficiency 01/29/2018  ? Uncontrolled type 2 diabetes mellitus with hyperglycemia, with long-term current use of insulin ([place]) 01/23/2018  ? DNR (do not resuscitate) 01/23/2018  ? CKD stage 3 due to type 2 diabetes mellitus ([place]) 01/23/2018  ? IBS (irritable bowel syndrome) 01/19/2018  ? Chronic lower extremity pain (Primary Area of Pain) (Bilateral) 01/08/2018  ? Chronic low back pain [facility] Area of Pain) (Bilateral) w/ sciatica (Bilateral) 01/08/2018  ? Chronic pain syndrome 01/08/2018  ? Pharmacologic therapy 01/08/2018  ? Disorder of skeletal system 01/08/2018  ? Problems influencing health status 01/08/2018  ? Long  term current use of opiate analgesic 01/08/2018  ? Restless leg syndrome 08/02/2017  ? Nocturnal hypoxemia 03/29/2017  ? Vision disturbance 02/28/2017  ? CKD (chronic kidney disease),  stage II 02/28/2017  ? Visual loss, bilateral   ? Chronic respiratory failure with hypoxia (HCC)/ nocturnal 02 dep  10/28/2016  ? Upper airway cough syndrome 08/22/2016  ? Cigarette smoker 06/15/2016  ? Simple chronic bronchitis ([place])   ? Coronary artery disease involving native heart without angina pectoris 05/16/2016  ? Chronic diastolic CHF (congestive heart failure) ([place]) 11/04/2015  ? Orthopnea 11/03/2015  ? Bilateral leg edema   ? Diabetes ([place])   ? COPD exacerbation ([place]) 10/15/2015  ? Fracture of rib, closed 10/15/2015  ? Venous stasis of both lower extremities 03/29/2015  ? Preventative health care 02/04/2015  ? Hypertension associated with diabetes ([place]) 01/02/2015  ? Inguinal hernia 11/27/2014  ? Allergic rhinitis with postnasal drip 01/21/2014  ? Aortic valve disorders 04/18/2013  ? Sleep apnea 05/22/2012  ? Syncope and collapse 10/09/2011  ? Seizure disorder ([place]) 10/09/2011  ? Bipolar disorder ([place]) 10/09/2011  ? TIA (transient ischemic attack) 06/22/2011  ? Constipation 06/15/2011  ? HYPERTRIGLYCERIDEMIA 07/17/2007  ? Situational anxiety 05/30/2007  ? COPD with chronic bronchitis ([place]) 05/30/2007  ? Morbid (severe) obesity due to excess calories ([place]) 04/23/2007  ? Tobacco abuse 09/07/2006  ? ? ?Orientation RESPIRATION BLADDER Height & Weight   ?  ?Self, Time, Situation, Place ? Normal Continent Weight: 230 lb (104.3 kg) ?Height:  '5\' 4"'$  (162.6 cm)  ?BEHAVIORAL SYMPTOMS/MOOD NEUROLOGICAL BOWEL NUTRITION STATUS  ?    Incontinent Diet (Please see dc summary)  ?AMBULATORY STATUS COMMUNICATION OF NEEDS Skin   ?Extensive Assist Verbally PU Stage and Appropriate Care (PU on Coccyx, foam dressing. PU right ear. Closed inicisons on left and foot, guaze dressing. Diabetic ulcer on left foot.) ?  ?PU Stage 2 Dressing:  (left and right buttocks, foam dressing, change daily) ?  ?    ?     ?     ? ? ?Personal Care Assistance Level of Assistance  ?Bathing, Feeding, Dressing Bathing Assistance: Maximum  assistance ?Feeding assistance: Independent ?Dressing Assistance: Maximum assistance ?   ? ?Functional Limitations Info  ?Sight, Hearing, Speech Sight Info: Adequate ?Hearing Info: Adequate ?Speech Info: Adequate  ? ? ?SPECIAL CARE FACTORS FREQUENCY  ?PT (By licensed PT), OT (By licensed OT)   ?  ?PT Frequency: 5x ?OT Frequency: 5x ?  ?  ?  ?   ? ? ?Contractures Contractures Info: Not present  ? ? ?Additional Factors Info  ?Code Status, Allergies Code Status Info: Full ?Allergies Info: Adhesive (Tape), Metoprolol, Montelukast, Morphine Sulfate, Penicillins, Silicone, Diltiazem, Doxycycline, Gabapentin, Midodrine, Percocet (Oxycodone-acetaminophen) ?  ?  ?  ?   ? ?Current Medications (10/12/2021):  This is the current hospital active medication list ?Current Facility-Administered Medications  ?Medication Dose Route Frequency Provider Last Rate Last Admin  ? acetaminophen (TYLENOL) tablet 650 mg  650 mg Oral Q4H PRN Algernon Huxley, MD   650 mg at 10/11/21 0501  ? albuterol (PROVENTIL) (2.5 MG/3ML) 0.083% nebulizer solution 3 mL  3 mL Nebulization Q6H PRN Algernon Huxley, MD      ? ALPRAZolam Duanne Moron) tablet 1 mg  1 mg Oral QID PRN Algernon Huxley, MD   1 mg at 10/11/21 2052  ? alum & mag hydroxide-simeth (MAALOX/MYLANTA) 200-200-20 MG/5ML suspension 30 mL  30 mL Oral Q4H PRN Algernon Huxley, MD   30 mL at 10/09/21  2228  ? anastrozole (ARIMIDEX) tablet 1 mg  1 mg Oral Daily Dew, Erskine Squibb, MD   1 mg at 10/12/21 0830  ? aspirin EC tablet 81 mg  81 mg Oral QAC breakfast Algernon Huxley, MD   81 mg at 10/12/21 3734  ? busPIRone (BUSPAR) tablet 30 mg  30 mg Oral BID Algernon Huxley, MD   30 mg at 10/12/21 2876  ? citalopram (CELEXA) tablet 20 mg  20 mg Oral Daily Algernon Huxley, MD   20 mg at 10/12/21 0827  ? collagenase (SANTYL) ointment   Topical Daily Algernon Huxley, MD   Given at 10/12/21 [phone]  ? desvenlafaxine (PRISTIQ) 24 hr tablet 100 mg  100 mg Oral Daily Algernon Huxley, MD   100 mg at 10/12/21 7262  ? enoxaparin (LOVENOX) injection 52.5 mg   0.5 mg/kg Subcutaneous Q24H Algernon Huxley, MD   52.5 mg at 10/11/21 2057  ? HYDROcodone-acetaminophen (NORCO/VICODIN) 5-325 MG per tablet 1-2 tablet  1-2 tablet Oral Q6H PRN Algernon Huxley, MD   2 tablet at 10/12/21 1016  ?

## 2021-10-12 NOTE — Clinical Social Work Note (Addendum)
RE:  [name]   ?Date of Birth:   [birth year]______  ?Date: 10/12/2021     ? ? ?To Whom It May Concern: ? ?Please be advised that the above-named patient will require a short-term nursing home stay - anticipated 30 days or less for rehabilitation and strengthening.  The plan is for return home. ? ? ?MD electronic signature noted below ?

## 2021-10-13 ENCOUNTER — Encounter
Admission: EM | Disposition: A | Discharge status: Skilled Nursing Facility | Payer: Self-pay | Source: Home / Self Care | Attending: Internal Medicine

## 2021-10-13 ENCOUNTER — Inpatient Hospital Stay
Admit: 2021-10-13 | Discharge: 2021-10-13 | Disposition: A | Discharge status: Home / Self Care | Payer: BC Managed Care – PPO | Attending: Internal Medicine | Admitting: Internal Medicine

## 2021-10-13 ENCOUNTER — Encounter: Payer: Self-pay | Admitting: Internal Medicine

## 2021-10-13 DIAGNOSIS — L03116 Cellulitis of left lower limb: Secondary | ICD-10-CM | POA: Diagnosis not present

## 2021-10-13 DIAGNOSIS — R7881 Bacteremia: Secondary | ICD-10-CM | POA: Diagnosis not present

## 2021-10-13 DIAGNOSIS — B9562 Methicillin resistant Staphylococcus aureus infection as the cause of diseases classified elsewhere: Secondary | ICD-10-CM | POA: Diagnosis not present

## 2021-10-13 HISTORY — PX: TEE WITHOUT CARDIOVERSION: SHX5443

## 2021-10-13 LAB — CULTURE, BLOOD (ROUTINE X 2): Culture: NO GROWTH

## 2021-10-13 LAB — BASIC METABOLIC PANEL
Anion gap: 6 (ref 5–15)
BUN: 32 mg/dL — ABNORMAL HIGH (ref 6–20)
CO2: 29 mmol/L (ref 22–32)
Calcium: 8.1 mg/dL — ABNORMAL LOW (ref 8.9–10.3)
Chloride: 102 mmol/L (ref 98–111)
Creatinine, Ser: 0.98 mg/dL (ref 0.44–1.00)
GFR, Estimated: >60 mL/min (ref >60)
Glucose, Bld: 435 mg/dL — ABNORMAL HIGH (ref 70–99)
Potassium: 4.8 mmol/L (ref 3.5–5.1)
Sodium: 137 mmol/L (ref 135–145)

## 2021-10-13 LAB — GLUCOSE, CAPILLARY
Glucose-Capillary: 418 mg/dL — ABNORMAL HIGH (ref 70–99)
Glucose-Capillary: 305 mg/dL — ABNORMAL HIGH (ref 70–99)
Glucose-Capillary: 194 mg/dL — ABNORMAL HIGH (ref 70–99)
Glucose-Capillary: 202 mg/dL — ABNORMAL HIGH (ref 70–99)

## 2021-10-13 LAB — CK: Total CK: 786 U/L — ABNORMAL HIGH (ref 38–234)

## 2021-10-13 LAB — SURGICAL PATHOLOGY

## 2021-10-13 IMAGING — DX DG FOOT COMPLETE 3+V*L*
3 series · 3 of 3 positions shown · non-contrast
Comparison: None.

CLINICAL DATA: Foot infection

EXAM:
LEFT FOOT - COMPLETE 3+ VIEW

[foot ap]
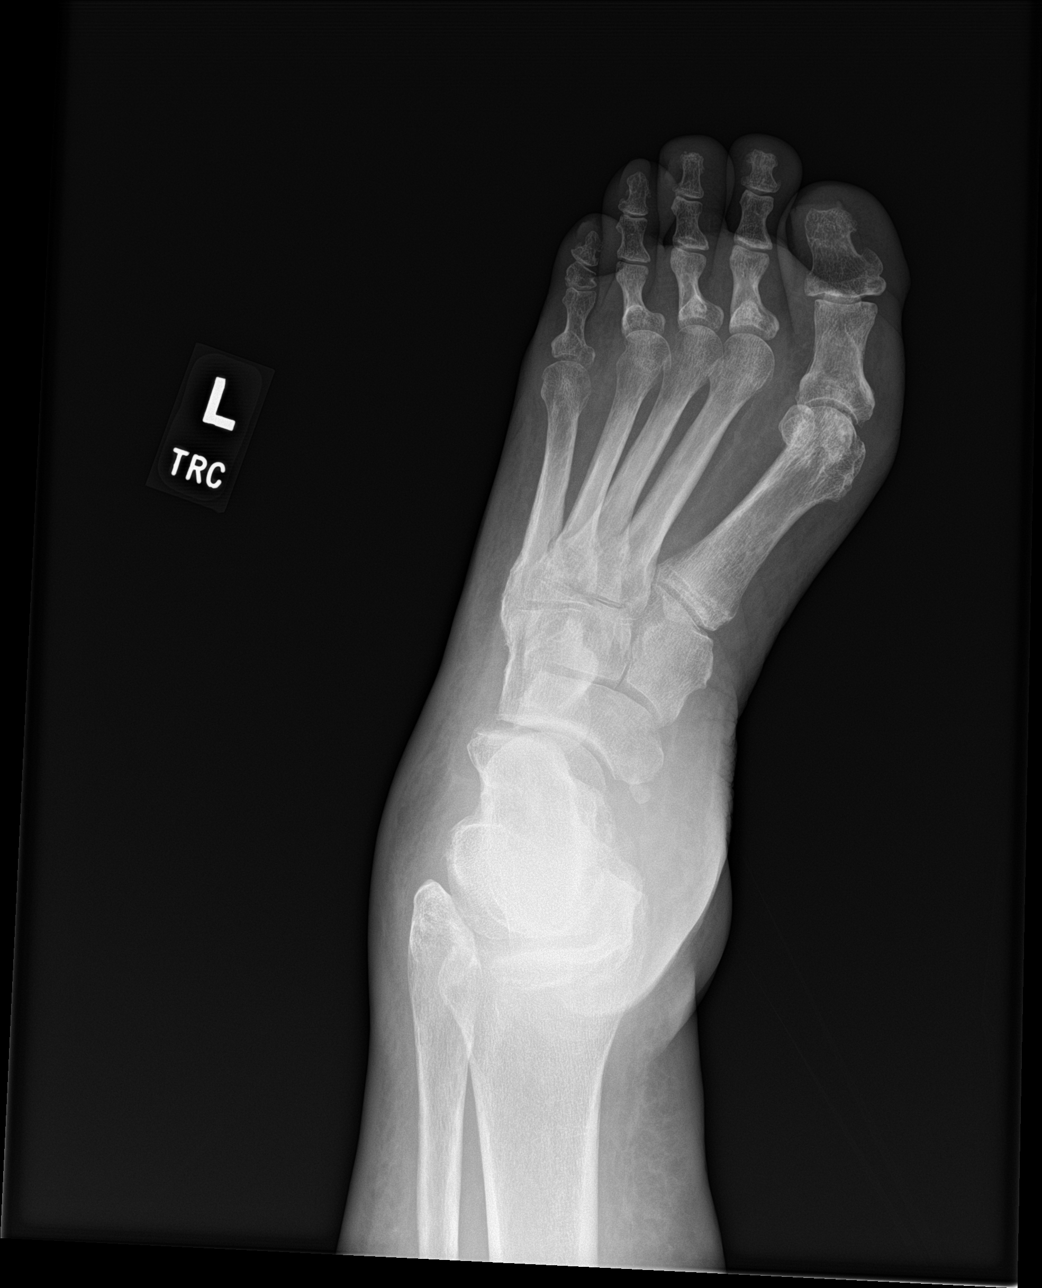

[foot obl]
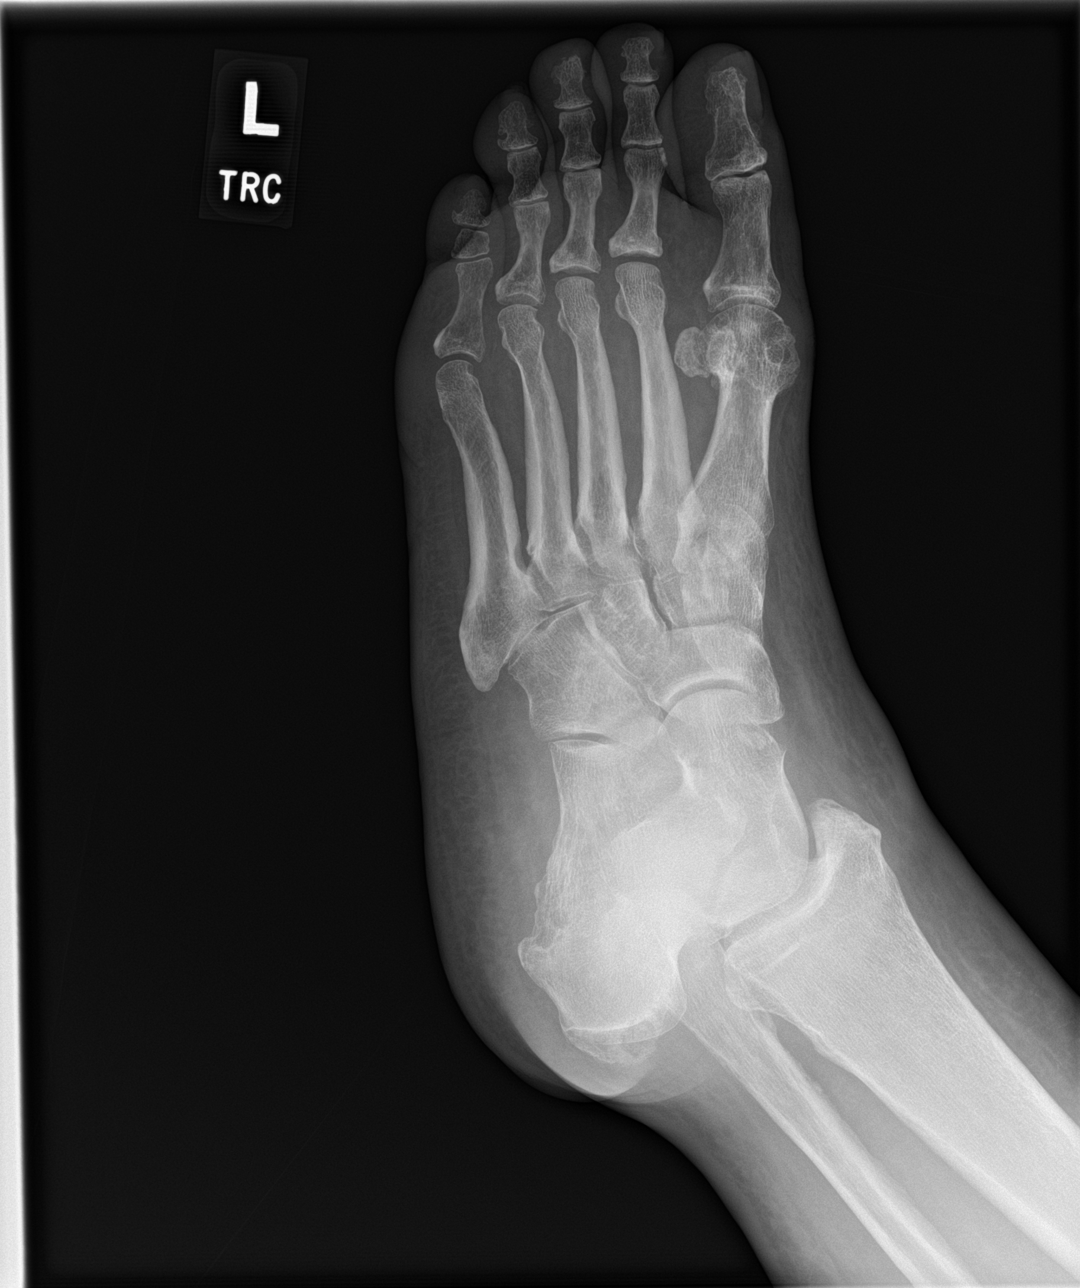

[foot lat]
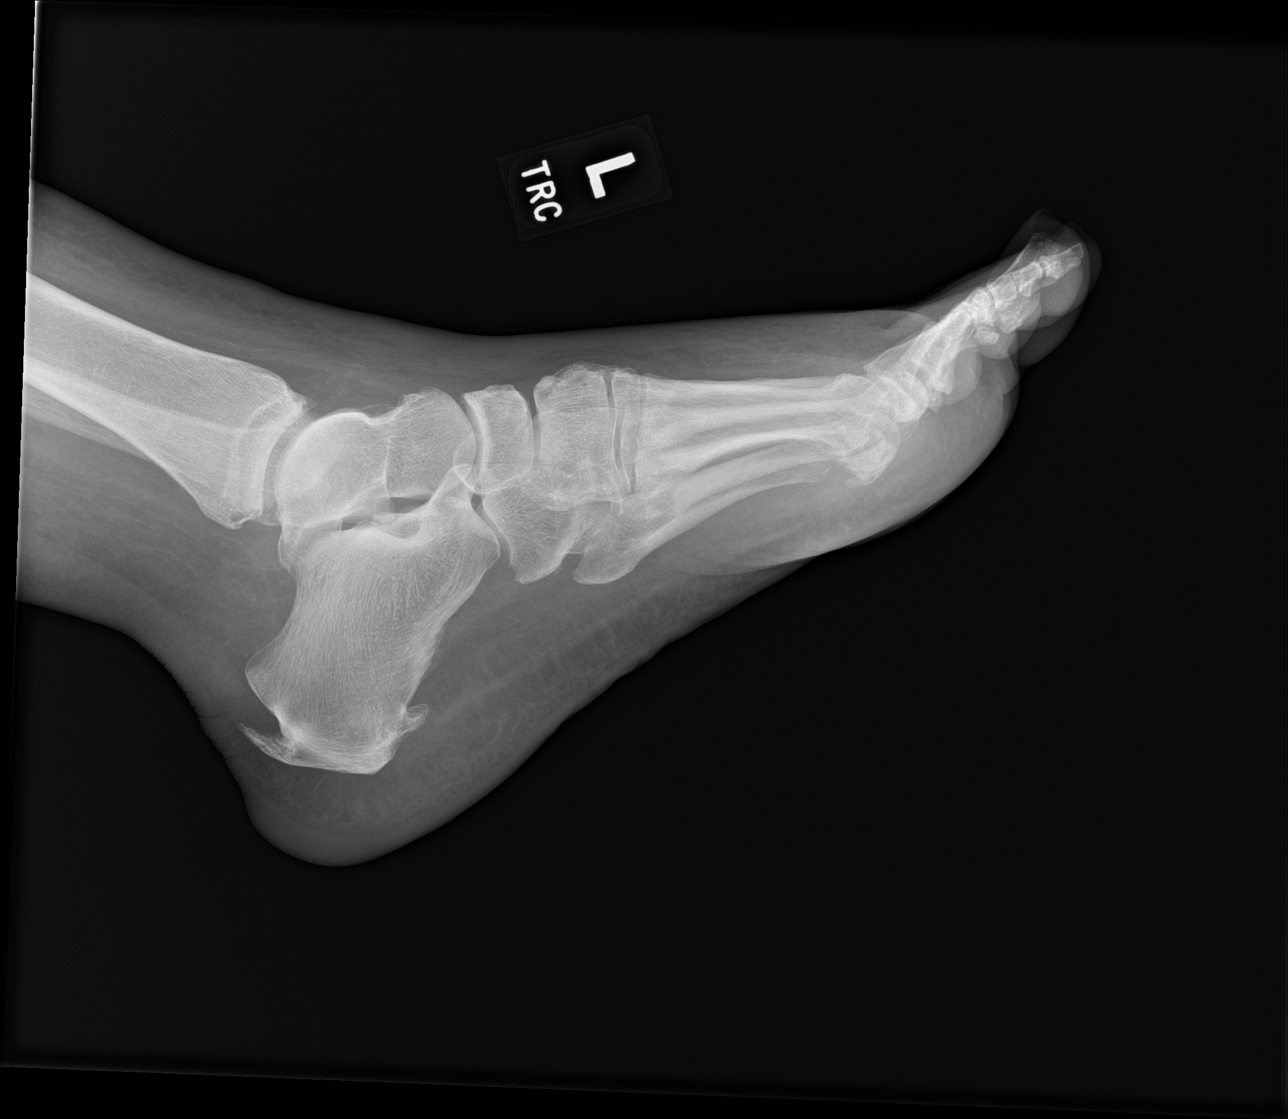

[3 of 3 positions shown; findings below may reference images not displayed]

FINDINGS: There is no evidence of fracture or dislocation. There is no
evidence of arthropathy or other focal bone abnormality. Soft
tissues are unremarkable. Large calcaneal enthesophytes. No
osteolysis.
IMPRESSION: Negative.

## 2021-10-13 IMAGING — US US EXTREM LOW VENOUS*L*
1 series · 14 of 24 positions shown · non-contrast
Comparison: None.

CLINICAL DATA: Left lower extremity swelling X 1 day.

EXAM:
LEFT LOWER EXTREMITY VENOUS DOPPLER ULTRASOUND
TECHNIQUE: Gray-scale sonography with compression, as well as color and duplex
ultrasound, were performed to evaluate the deep venous system(s)
from the level of the common femoral vein through the popliteal and
proximal calf veins.

[Series 1: us venous img lower uni left (dvt) · portal-venous · 14 of 34 slices shown]
[im 1/34]
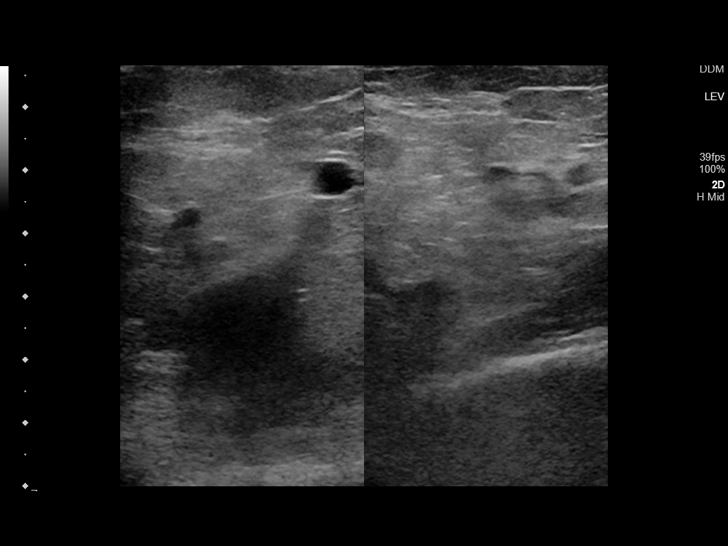
[im 3/34]
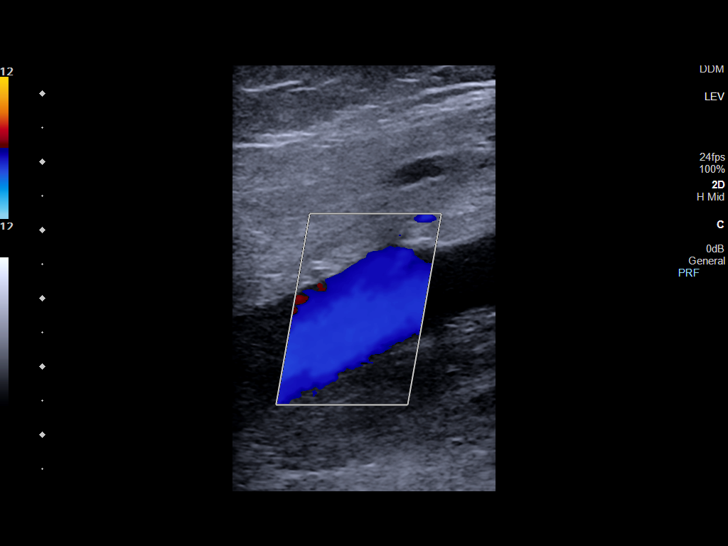
[im 6/34]
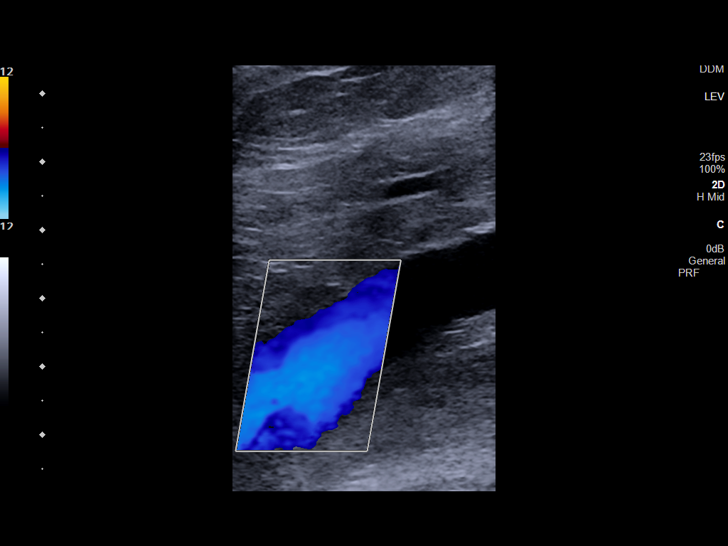
[im 9/34]
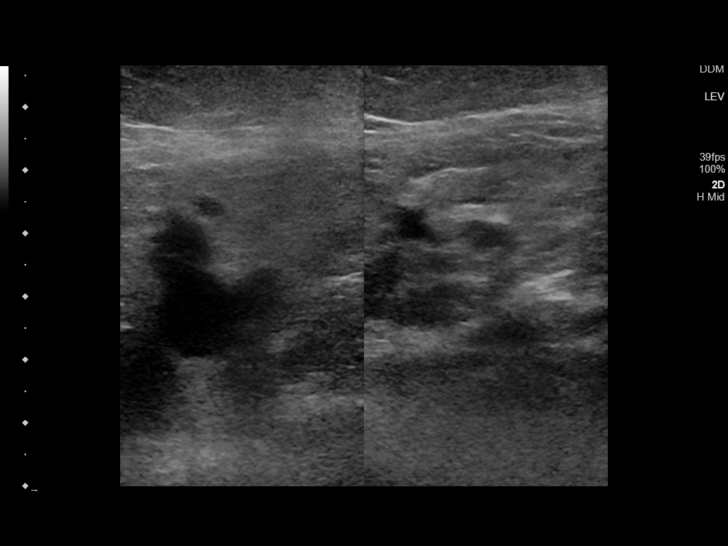
[im 11/34]
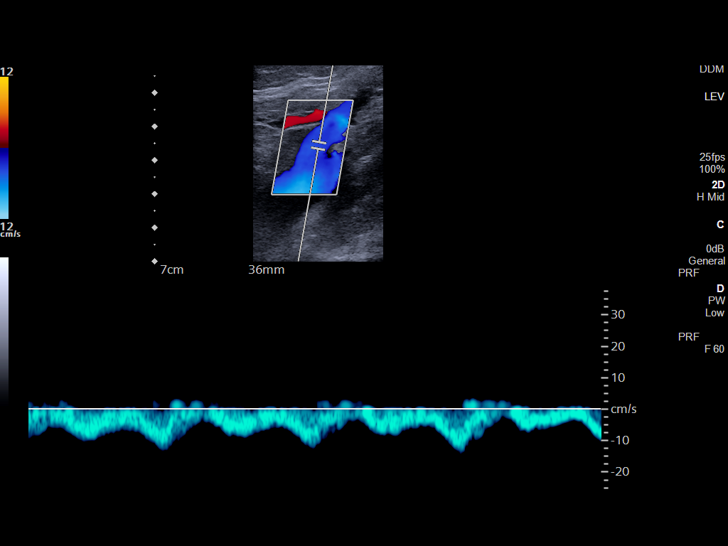
[im 13/34]
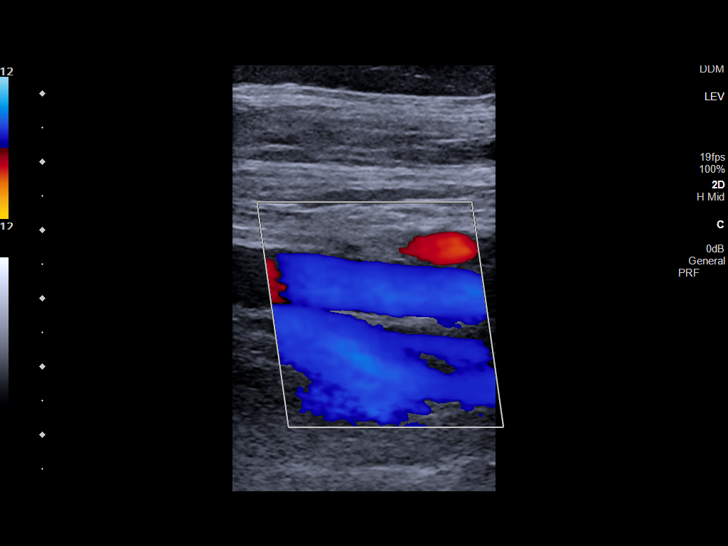
[im 16/34]
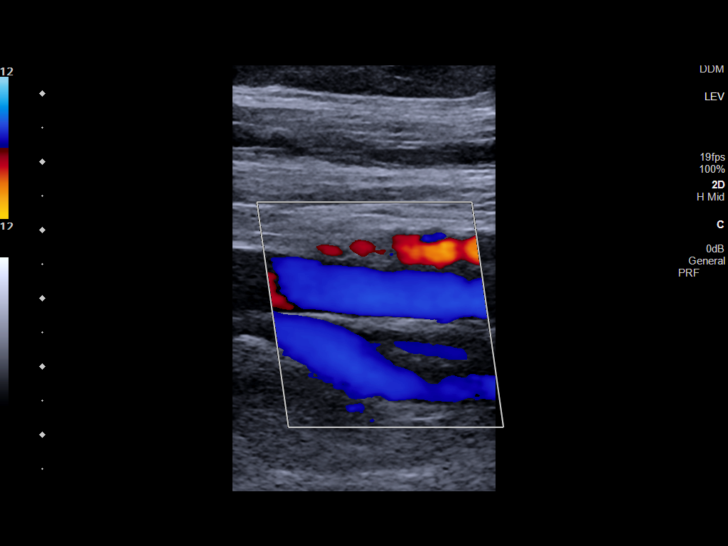
[im 18/34]
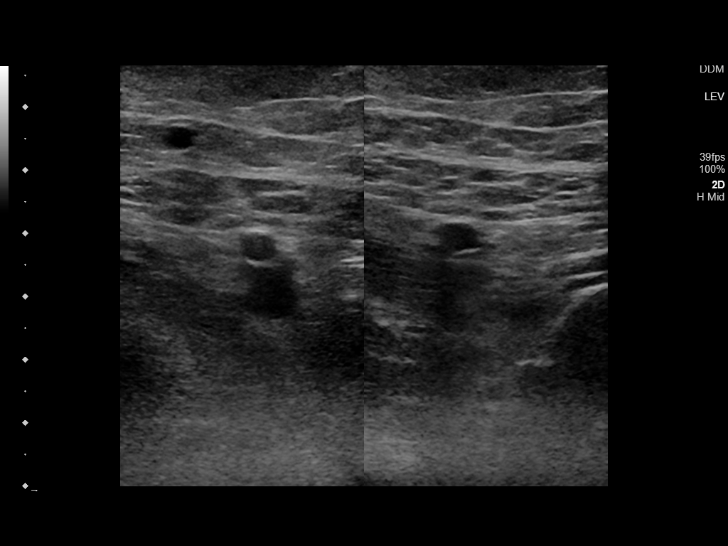
[im 21/34]
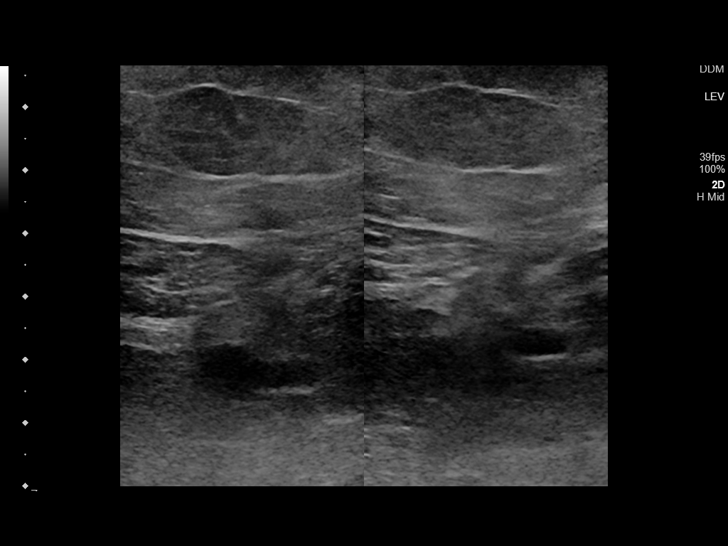
[im 23/34]
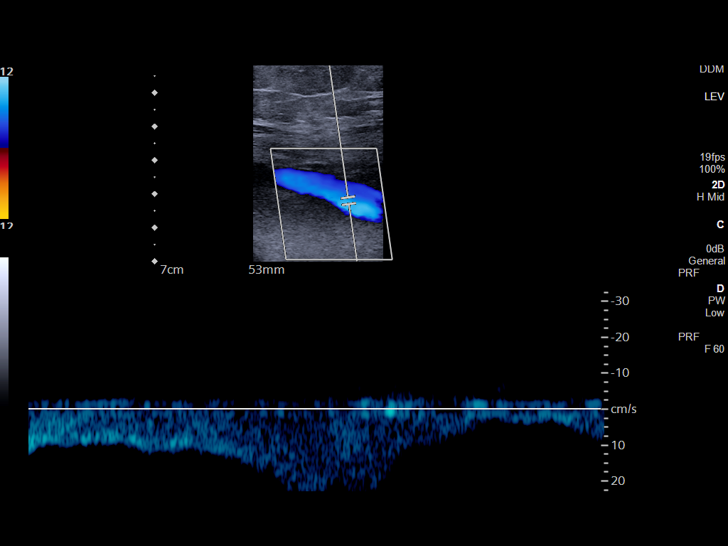
[im 26/34]
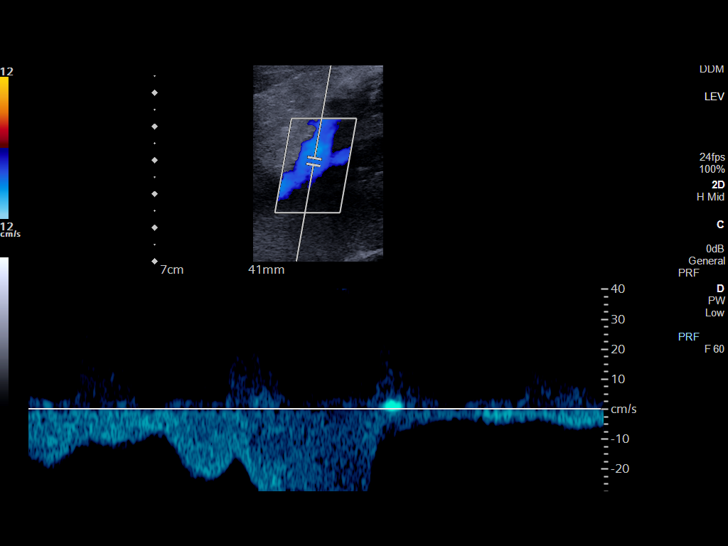
[im 28/34]
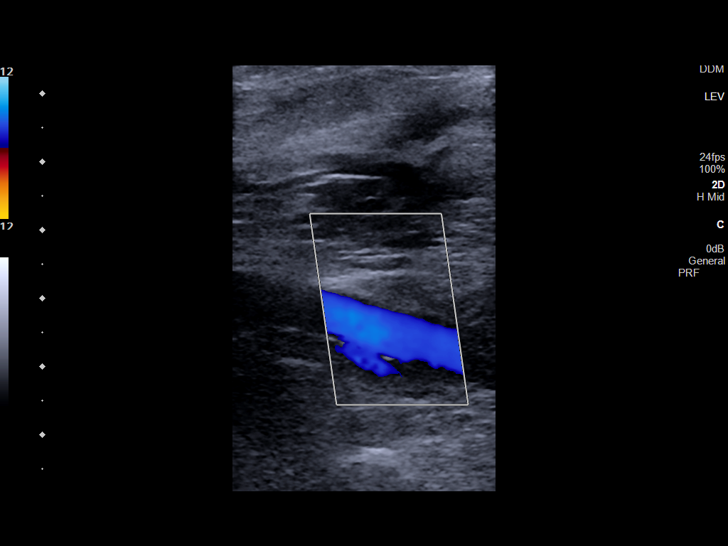
[im 31/34]
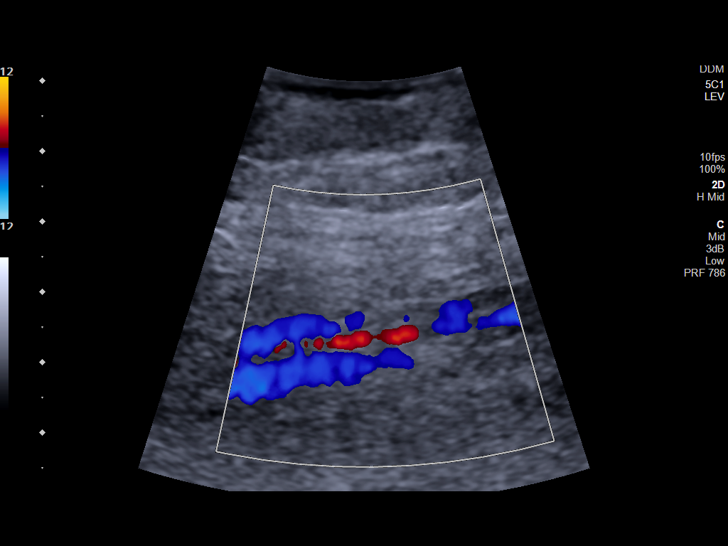
[im 34/34]
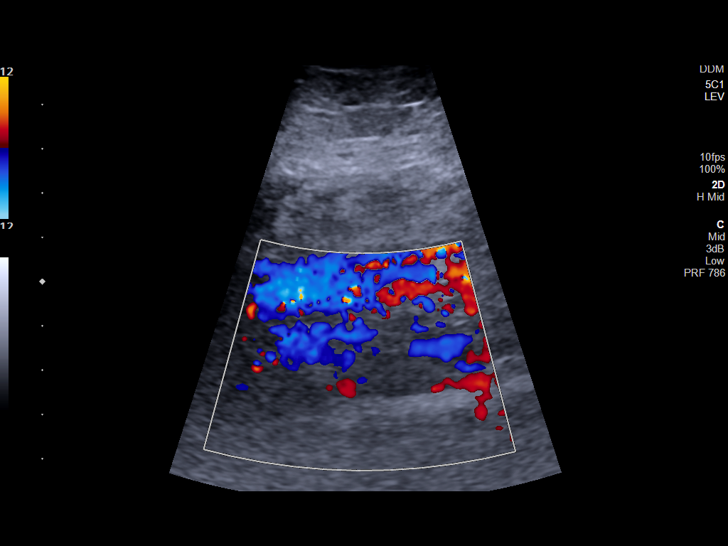

[14 of 24 positions shown; findings below may reference images not displayed]

FINDINGS: VENOUS

Normal compressibility of the common femoral, superficial femoral,
and popliteal veins, as well as the visualized calf veins.
Visualized portions of profunda femoral vein and great saphenous
vein unremarkable. No filling defects to suggest DVT on grayscale or
color Doppler imaging. Doppler waveforms show normal direction of
venous flow, normal respiratory plasticity and response to
augmentation.

Limited views of the contralateral common femoral vein are
unremarkable.

OTHER

None.

Limitations: none
IMPRESSION: No deep venous thrombosis of the left lower extremity.

## 2021-10-13 SURGERY — ECHOCARDIOGRAM, TRANSESOPHAGEAL
Anesthesia: Moderate Sedation

## 2021-10-13 MED ORDER — SODIUM CHLORIDE FLUSH 0.9 % IV SOLN
INTRAVENOUS | Status: AC
  Filled 2021-10-13: qty 10

## 2021-10-13 MED ORDER — LIDOCAINE VISCOUS HCL 2 % MT SOLN
OROMUCOSAL | Status: AC
  Filled 2021-10-13: qty 15

## 2021-10-13 MED ORDER — ROSUVASTATIN CALCIUM 10 MG PO TABS
10.0000 mg | ORAL_TABLET | Freq: Every day | ORAL | Status: DC
  Administered 2021-10-13: 10 mg via ORAL
  Filled 2021-10-13: qty 1

## 2021-10-13 MED ORDER — BUTAMBEN-TETRACAINE-BENZOCAINE 2-2-14 % EX AERO
INHALATION_SPRAY | CUTANEOUS | Status: AC
  Filled 2021-10-13: qty 5

## 2021-10-13 MED ORDER — INSULIN GLARGINE-YFGN 100 UNIT/ML ~~LOC~~ SOLN
35.0000 [IU] | Freq: Two times a day (BID) | SUBCUTANEOUS | Status: DC
  Administered 2021-10-13 – 2021-10-14 (×3): 35 [IU] via SUBCUTANEOUS
  Filled 2021-10-13 (×4): qty 0.35

## 2021-10-13 MED ORDER — FENTANYL CITRATE (PF) 100 MCG/2ML IJ SOLN
INTRAMUSCULAR | Status: AC | PRN
  Administered 2021-10-13: 25 ug via INTRAVENOUS

## 2021-10-13 MED ORDER — LIDOCAINE VISCOUS HCL 2 % MT SOLN
OROMUCOSAL | Status: AC | PRN
  Administered 2021-10-13: 15 mL via OROMUCOSAL

## 2021-10-13 MED ORDER — MIDAZOLAM HCL 2 MG/2ML IJ SOLN
INTRAMUSCULAR | Status: AC | PRN
  Administered 2021-10-13: 2 mg via INTRAVENOUS

## 2021-10-13 MED ORDER — SODIUM CHLORIDE 0.9 % IV SOLN: INTRAVENOUS | Status: DC

## 2021-10-13 MED ORDER — FENTANYL CITRATE (PF) 100 MCG/2ML IJ SOLN
INTRAMUSCULAR | Status: AC
  Filled 2021-10-13: qty 2

## 2021-10-13 MED ORDER — MIDAZOLAM HCL 2 MG/2ML IJ SOLN
INTRAMUSCULAR | Status: AC
  Filled 2021-10-13: qty 4

## 2021-10-13 MED ORDER — BUTAMBEN-TETRACAINE-BENZOCAINE 2-2-14 % EX AERO
INHALATION_SPRAY | CUTANEOUS | Status: AC | PRN
  Administered 2021-10-13: 5 via TOPICAL

## 2021-10-13 NOTE — Progress Notes (Signed)
[place] at [facility] ? ? ?PATIENT NAME: [name]   ? ?MR#:  [id number] ? ?DATE OF BIRTH:  Dec 01, 1970 ? ?SUBJECTIVE:  ? ?patient will sleepy from procedure earlier. No complaints. No family at bedside. Sugars have been running high. Family bringing food from outside. Discussed with patient carb control diet ?VITALS:  ?Blood pressure 116/65, pulse 83, temperature 98.1 ?F (36.7 ?C), resp. rate 16, height 5' 4" (1.626 m), weight 103.4 kg, SpO2 100 %. ? ?PHYSICAL EXAMINATION:  ? ?GENERAL:  51 y.o.-year-old patient lying in the bed with no acute distress.  ?LUNGS: Normal breath sounds bilaterally, no wheezing, rales, rhonchi.  ?CARDIOVASCULAR: S1, S2 normal. No murmurs, rubs, or gallops.  ?ABDOMEN: Soft, nontender, nondistended. Bowel sounds present.  ?EXTREMITIES: Left BKA + ?NEUROLOGIC: nonfocal  patient is alert and awake ?SKIN: No obvious rash, lesion, or ulcer.  ? ?LABORATORY PANEL:  ?CBC ?Recent Labs  ?Lab 10/12/21 ?0947  ?WBC 15.5*  ?HGB 8.8*  ?HCT 29.3*  ?PLT 286  ? ? ? ?Chemistries  ?Recent Labs  ?Lab 10/12/21 ?0962 10/13/21 ?0518  ?NA 138 137  ?K 5.4* 4.8  ?CL 103 102  ?CO2 25 29  ?GLUCOSE 399* 435*  ?BUN 33* 32*  ?CREATININE 1.00 0.98  ?CALCIUM 8.1* 8.1*  ?MG 2.5*  --   ? ? ? ?Assessment and Plan ? ?[name] is a 51 yo female with PMH PVD, HTN, HLD, DMII, COPD, asthma, CVA, GERD, depression/anxiety, tobacco abuse, seizure d/o, anemia, CAD, CKD3a, dCHF, right breast cancer (s/p mastectomy) who presented with worsening pain and swelling in her left foot and a nonhealing wound. ?  ?She was recently hospitalized from 09/20/2021 until 09/23/2021 for a diabetic infection involving her left foot and she had undergone debridement on 09/21/2021 with podiatry in the OR.  She also had vascular surgery evaluation with abdominal aortogram and left lower extremity angiogram (no significant stenosis/no intervention required).  ? ?Left lower extremity gangrene/cellulitis ?-- concern for  healing of wound poor. Vascular consultation was obtained with Dr. Lucky Cowboy. Patient is status post left BKA 10/11/2021 ?-- initiated ?-- TOC for discharge planning--pt has chosen [facility] rehab ? ?M RSA bacteremia ?-- infectious disease consultation appreciated ?-- TTE negative for vegetation ?-- TEE pending.  will be scheduled by Dr. Nehemiah Massed. Discussed with husband and he is in agreement. Patient was initially hesitant ?-- continue IV vancomycin ?---TEE--no vegetation ?--per ID pt will be on IV daptomycin for 4 weeks. Will get PICC placed ? ?Uncontrolled DM-2 with CKD 3a ?-- patient advised to continue carb control diet. Advised family not to bring outside for ?--Cont Semglee now increased to bid with Novolog and SSI ? ?PAD ?--underwent angiogram of abdomen and left lower extremity on 09/22/2021 with no significant stenosis nor stenting required ?-Continue aspirin and Crestor ? ?chronic diastolic heart failure ?-- stable ?-- no signs symptoms of exacerbation ?-- continue aspirin and Spironolactone ? ?Breast cancer ([place]) ?- Continue anastrozole ?- s/p right mastectomy  ?  ?CAD (coronary artery disease) ?Continue aspirin and Crestor ?  ?Chronic kidney disease, stage 3a ([place]) ?- patient has history of CKD3a. Baseline creat ~ 1 - 1.1, eGFR 50closely ?  ?Bipolar disorder ([place]) ?Continue trazodone, Pristiq, buspirone and Celexa ?  ?COPD with chronic bronchitis ([place]) ?Not acutely exacerbated ?Continue as needed bronchodilator therapy as well as inhaled steroids ?  ?Tobacco abuse ?Smoking cessation discussed with patient ?-Continue nicotine patch ? ?Procedures: left BKA 10/11/2021 ?Family communication :none today ?Consults : podiatry, vascular, ID,  cardiology ?CODE STATUS: full ?DVT Prophylaxis : enoxaparin ?Level of care: Med-Surg ?Status is: Inpatient ?Remains inpatient appropriate because: to rehab likley tomorrow ?  ?discharge 1 to 2 days once above workup is completed and rehab bed up available. ? ?TOTAL TIME TAKING CARE OF  THIS PATIENT: 25 minutes.  ?>50% time spent on counselling and coordination of care ? ?Note: This dictation was prepared with Dragon dictation along with smaller phrase technology. Any transcriptional errors that result from this process are unintentional. ? ?[name] M.D  ? ? ?[facility]  ? ?CC: ?Primary care physician; Pleas Koch, NP  ?

## 2021-10-13 NOTE — Progress Notes (Signed)
[facility] Encounter Note ? ?Patient: [name] / Admit Date: 10/07/2021 / Date of Encounter: 10/13/2021, 3:22 PM ? ? ?Subjective: ?Overall patient has done fairly well since her surgical intervention gangrenous foot without any complications.  Patient is feeling better with medication management as well as antibiotics.  There has been no evidence of significant side effects. ?Patient undergone a transesophageal echocardiogram showing no primary source infection bacteremia and no evidence of endocarditis with normal LV systolic function. ? ?Review of Systems: ?Positive for: Weakness ?Negative for: Vision change, hearing change, syncope, dizziness, nausea, vomiting,diarrhea, bloody stool, stomach pain, cough, congestion, diaphoresis, urinary frequency, urinary pain,skin lesions, skin rashes ?Others previously listed ? ?Objective: ?Telemetry: Normal sinus rhythm ?Physical Exam: Blood pressure 116/65, pulse 83, temperature 98.1 ?F (36.7 ?C), resp. rate 16, height '5\' 4"'$  (1.626 m), weight 103.4 kg, SpO2 100 %. Body mass index is 39.13 kg/m?. ?General: Well developed, well nourished, in no acute distress. ?Head: Normocephalic, atraumatic, sclera non-icteric, no xanthomas, nares are without discharge. ?Neck: No apparent masses ?Lungs: Normal respirations with no wheezes, no rhonchi, no rales , no crackles  ? Heart: Regular rate and rhythm, normal S1 S2, no murmur, no rub, no gallop, PMI is normal size and placement, carotid upstroke normal without bruit, jugular venous pressure normal ?Abdomen: Soft, non-tender, non-distended with normoactive bowel sounds. No hepatosplenomegaly. Abdominal aorta is normal size without bruit ?Extremities: No edema, no clubbing, no cyanosis, left BKA ?Peripheral: 2+ radial, 2+ femoral, 2+ dorsal pedal pulses ?Neuro: Alert and oriented. Moves all extremities spontaneously. ?Psych:  Responds to questions appropriately with a normal affect. ? ? ?Intake/Output Summary  (Last 24 hours) at 10/13/2021 1522 ?Last data filed at 10/13/2021 0500 ?Gross per 24 hour  ?Intake --  ?Output 1100 ml  ?Net -1100 ml  ? ? ?Inpatient Medications:  ?? anastrozole  1 mg Oral Daily  ?? aspirin EC  81 mg Oral QAC breakfast  ?? busPIRone  30 mg Oral BID  ?? butamben-tetracaine-benzocaine      ?? citalopram  20 mg Oral Daily  ?? collagenase   Topical Daily  ?? desvenlafaxine  100 mg Oral Daily  ?? enoxaparin (LOVENOX) injection  0.5 mg/kg Subcutaneous Q24H  ?? fentaNYL      ?? insulin aspart  0-15 Units Subcutaneous TID WC  ?? insulin aspart  6 Units Subcutaneous TID WC  ?? insulin glargine-yfgn  35 Units Subcutaneous BID  ?? lidocaine      ?? midazolam      ?? mometasone-formoterol  2 puff Inhalation BID  ?? nicotine  14 mg Transdermal Daily  ?? pregabalin  300 mg Oral BID  ?? rOPINIRole  1 mg Oral QHS  ?? rosuvastatin  10 mg Oral QHS  ?? sodium chloride flush      ?? spironolactone  50 mg Oral Daily  ?? traZODone  200 mg Oral QHS  ? ?Infusions:  ? ?Labs: ?Recent Labs  ?  10/11/21 ?0342 10/12/21 ?1610 10/13/21 ?0518  ?NA 136 138 137  ?K 4.1 5.4* 4.8  ?CL 101 103 102  ?CO2 '26 25 29  '$ ?GLUCOSE 249* 399* 435*  ?BUN 38* 33* 32*  ?CREATININE 0.97 1.00 0.98  ?CALCIUM 8.3* 8.1* 8.1*  ?MG 2.5* 2.5*  --   ? ?No results for input(s): AST, ALT, ALKPHOS, BILITOT, PROT, ALBUMIN in the last 72 hours. ?Recent Labs  ?  10/11/21 ?0342 10/12/21 ?9604  ?WBC 11.8* 15.5*  ?NEUTROABS 9.7* 14.1*  ?HGB 9.6* 8.8*  ?HCT 30.7* 29.3*  ?  MCV 83.2 84.9  ?PLT 281 286  ? ?Recent Labs  ?  10/13/21 ?0518  ?CKTOTAL 786*  ? ?Invalid input(s): POCBNP ?No results for input(s): HGBA1C in the last 72 hours.  ? ?Weights: ?Filed Weights  ? 10/07/21 1344 10/12/21 1700 10/13/21 0807  ?Weight: 104.3 kg 103.4 kg 103.4 kg  ? ? ? ?Radiology/Studies:  ?MR HEEL LEFT WO CONTRAST ? ?Result Date: 09/20/2021 ?CLINICAL DATA:  Foot swelling, diabetes, possible ulcer EXAM: MR OF THE LEFT HEEL WITHOUT CONTRAST TECHNIQUE: Multiplanar, multisequence MR imaging of the  ankle was performed. No intravenous contrast was administered. COMPARISON:  Radiographs 09/20/2021 FINDINGS: Despite efforts by the technologist and patient, motion artifact is present on today's exam and could not be eliminated. This reduces exam sensitivity and specificity. TENDONS Peroneal: Unremarkable Posteromedial: Distal tibialis posterior tendinopathy. Anterior: Extensor digitorum longus tenosynovitis as on image 7 series 10. Achilles: Grossly intact Plantar Fascia: Mildly thickened medial band of the plantar fascia proximally potentially reflecting mild plantar fasciitis. LIGAMENTS Lateral: Indeterminate due to motion artifact. Medial: Indeterminate due to motion artifact. CARTILAGE Ankle Joint: Mild chondral thinning, no focal osteochondral lesion identified. Subtalar Joints/Sinus Tarsi: Spurring along the posterior subtalar joint with low-level subcortical marrow edema which is likely degenerative. Low-level edema in the sinus tarsi. Bones: No substantial marrow edema is identified to indicate active osteomyelitis. Other: Posteroinferior heel ulceration as shown for example on image 27 of series 6, with local subcutaneous edema but no underlying bony abnormality. No drainable abscess identified. IMPRESSION: 1. Posteroinferior heel ulceration without drainable abscess or osteomyelitis. 2. Tibialis posterior tendinopathy. 3. Extensor digitorum longus tenosynovitis. 4. Possible mild plantar fasciitis. 5. Mild chondral thinning in the tibiotalar joint along with degenerative findings in the subtalar joints. 6. Despite efforts by the technologist and patient, motion artifact is present on today's exam and could not be eliminated. This reduces exam sensitivity and specificity. Electronically Signed   By: Van Clines M.D.   On: 09/20/2021 21:58  ? ?MR FOOT LEFT WO CONTRAST ? ?Result Date: 09/20/2021 ?CLINICAL DATA:  Foot swelling and diabetes. EXAM: MRI OF THE LEFT FOREFOOT WITHOUT CONTRAST TECHNIQUE:  Multiplanar, multisequence MR imaging of the left forefoot was performed. No intravenous contrast was administered. COMPARISON:  09/20/2021 radiographs FINDINGS: Despite efforts by the technologist and patient, motion artifact is present on today's exam and could not be eliminated. This reduces exam sensitivity and specificity. Bones/Joint/Cartilage Suspected erosions of the first metatarsal head, gout not excluded. No substantial first MTP joint effusion to suggest a septic joint or infection. No compelling findings of marrow edema in the phalanges or remaining metatarsals to indicate osteomyelitis or fracture. Lisfranc joint alignment normal. Ligaments The Lisfranc ligament appears grossly intact. Muscles and Tendons Regional muscular atrophy noted. Soft tissues Dorsal subcutaneous edema along the forefoot, cellulitis is not excluded. IMPRESSION: 1. No findings of osteomyelitis in the forefoot. However, there is dorsal subcutaneous edema and cellulitis is not excluded. 2. Erosions of the first metatarsal head, potentially from gout or rheumatoid arthritis. Electronically Signed   By: Van Clines M.D.   On: 09/20/2021 21:53  ? ?PERIPHERAL VASCULAR CATHETERIZATION ? ?Result Date: 09/22/2021 ?See surgical note for result. ? ?US Venous Img Lower Unilateral Left ? ?Result Date: 09/20/2021 ?CLINICAL DATA:  Left lower extremity swelling EXAM: LEFT LOWER EXTREMITY VENOUS DOPPLER ULTRASOUND TECHNIQUE: Gray-scale sonography with compression, as well as color and duplex ultrasound, were performed to evaluate the deep venous system(s) from the level of the common femoral vein through the popliteal and proximal calf  veins. COMPARISON:  None. FINDINGS: VENOUS Normal compressibility of the common femoral, superficial femoral, and popliteal veins, as well as the visualized calf veins. Visualized portions of profunda femoral vein and great saphenous vein unremarkable. No filling defects to suggest DVT on grayscale or color  Doppler imaging. Doppler waveforms show normal direction of venous flow, normal respiratory plasticity and response to augmentation. Limited views of the contralateral common femoral vein are unremarkable. OTH

## 2021-10-13 NOTE — Progress Notes (Signed)
PT Cancellation Note ? ?Patient Details ?Name: [name] ?MRN: [id number] ?DOB: 1971/06/05 ? ? ?Cancelled Treatment:     PT attempt. Pt was planning to have TEE procedure this morning however BS in 418. Will return later after/when pt is more appropriate to participate.  ? ? ?Willette Pa ?10/13/2021, 7:46 AM ?

## 2021-10-13 NOTE — Progress Notes (Signed)
Physical Therapy Treatment ?Patient Details ?Name: [name] ?MRN: [id number] ?DOB: 1971-06-14 ?Today's Date: 10/13/2021 ? ? ?History of Present Illness Pt is a 51 yo female with PMH PVD, HTN, HLD, DMII, COPD, asthma, CVA, GERD, depression/anxiety, tobacco abuse, seizure d/o, anemia, CAD, CKD3a, dCHF, right breast cancer (s/p mastectomy) who presented with a left foot ulcer/infection and BLE lower leg pain.  Pt diagnosed with atherosclerotic occlusive disease bilateral lower extremities with gangrenous changes to the left heel. Pt underwent surgical debridement with podiatry on 09/21/2021.  She then underwent abdominal aortogram and left lower extremity angiogram with vascular surgery on 09/22/2021.  Pt had R BKA performed on 10/11/21. ? ?  ?PT Comments  ? ? Physical Therapy Treatment completed this date. Patient agreeable to treatment. Patient tolerated session well, with no pain reported throughout session. Patient completed supine to sit bed mobility at supervision with increased use of bed rails, however required Max A +2 for sit to supine bed mobility and to reposition in bed. Sit to stand transfer from elevated EOB required Max A +2, and Max A +2 to remain in static standing with RW. Patient completed x2 sit to stand transfers, and could only tolerate <30seconds in standing. Patient is progressing towards her goals and would continue to benefit from skilled physical therapy in order to optimize independence and safety with ADLs. Continue to recommend STR upon discharge from acute hospitalization.  ?  ?Recommendations for follow up therapy are one component of a multi-disciplinary discharge planning process, led by the attending physician.  Recommendations may be updated based on patient status, additional functional criteria and insurance authorization. ? ?Follow Up Recommendations ? Skilled nursing-short term rehab (<3 hours/day) ?  ?  ?Assistance Recommended at Discharge Frequent or constant  Supervision/Assistance  ?Patient can return home with the following Two people to help with walking and/or transfers;Two people to help with bathing/dressing/bathroom;Assistance with cooking/housework;Assist for transportation;Help with stairs or ramp for entrance ?  ?Equipment Recommendations ? Rolling walker (2 wheels);BSC/3in1;Wheelchair cushion (measurements PT)  ?  ?Recommendations for Other Services   ? ? ?  ?Precautions / Restrictions Precautions ?Precautions: Fall ?Restrictions ?Weight Bearing Restrictions: No  ?  ? ?Mobility ? Bed Mobility ?Overal bed mobility: Needs Assistance ?Bed Mobility: Supine to Sit ?Rolling: Supervision ?  ?Supine to sit: Supervision, HOB elevated ?Sit to supine: Max assist, +2 for physical assistance ?  ?General bed mobility comments: use of bedrails and HOB being elevated ?  ? ?Transfers ?Overall transfer level: Needs assistance ?Equipment used: Rolling walker (2 wheels) ?Transfers: Sit to/from Stand ?Sit to Stand: +2 physical assistance, Max assist, +2 safety/equipment, From elevated surface ?  ?  ?  ?  ?  ?General transfer comment: x2, Max A +2 to maintain standing ?  ? ?Ambulation/Gait ?Ambulation/Gait assistance:  (unable to complete at this time) ?  ?  ?  ?  ?  ?  ?  ? ? ?Stairs ?  ?  ?  ?  ?  ? ? ?Wheelchair Mobility ?  ? ?Modified Rankin (Stroke Patients Only) ?  ? ? ?  ?Balance Overall balance assessment: Needs assistance ?  ?Sitting balance-Leahy Scale: Normal ?  ?  ?Standing balance support: Bilateral upper extremity supported, During functional activity, Reliant on assistive device for balance ?Standing balance-Leahy Scale: Poor ?  ?  ?  ?  ?  ?  ?  ?  ?  ?  ?  ?  ?  ? ?  ?Cognition Arousal/Alertness: Awake/alert ?Behavior During Therapy: [facility]  for tasks assessed/performed ?Overall Cognitive Status: Within Functional Limits for tasks assessed ?  ?  ?  ?  ?  ?  ?  ?  ?  ?  ?  ?  ?  ?  ?  ?  ?  ?  ?  ? ?  ?Exercises   ? ?  ?General Comments   ?  ?  ? ?Pertinent Vitals/Pain  Pain Assessment ?Pain Assessment: No/denies pain ?Pain Intervention(s): Monitored during session  ? ? ?Home Living   ?  ?  ?  ?  ?  ?  ?  ?  ?  ?   ?  ?Prior Function    ?  ?  ?   ? ?PT Goals (current goals can now be found in the care plan section) Acute Rehab PT Goals ?Patient Stated Goal: To get back home ?PT Goal Formulation: With patient/family ?Time For Goal Achievement: 10/26/21 ?Potential to Achieve Goals: Fair ?Progress towards PT goals: Progressing toward goals ? ?  ?Frequency ? ? ? 7X/week ? ? ? ?  ?PT Plan Current plan remains appropriate  ? ? ?Co-evaluation   ?  ?  ?  ?  ? ?  ?AM-PAC PT "6 Clicks" Mobility   ?Outcome Measure ? Help needed turning from your back to your side while in a flat bed without using bedrails?: A Little ?  ?Help needed moving to and from a bed to a chair (including a wheelchair)?: Total ?Help needed standing up from a chair using your arms (e.g., wheelchair or bedside chair)?: Total ?Help needed to walk in hospital room?: Total ?Help needed climbing 3-5 steps with a railing? : Total ?6 Click Score: 7 ? ?  ?End of Session Equipment Utilized During Treatment: Gait belt ?Activity Tolerance: Patient tolerated treatment well ?Patient left: with family/visitor present;in bed;with call bell/phone within reach;with bed alarm set ?Nurse Communication: Mobility status ?PT Visit Diagnosis: Unsteadiness on feet (R26.81);Muscle weakness (generalized) (M62.81);History of falling (Z91.81);Difficulty in walking, not elsewhere classified (R26.2) ?  ? ? ?Time: 3419-6222 ?PT Time Calculation (min) (ACUTE ONLY): 20 min ? ?Charges:  $Therapeutic Activity: 8-22 mins          ?          ? ?Iva Boop, PT  ?10/13/21. 3:35 PM ? ? ?

## 2021-10-13 NOTE — Treatment Plan (Deleted)
Diagnosis: ?MRSA bacteremia and chronic MRSA skin wounds ?Baseline Creatinine <1 ? ? ?Allergies  ?Allergen Reactions  ? Adhesive [Tape] Rash and Other (See Comments)  ?  TAKES OFF THE SKIN (CERTAIN MEDICAL TAPES DO THIS!!)  ? Metoprolol Shortness Of Breath  ?  Occurrence of shortness of breath after 3 days  ? Montelukast Shortness Of Breath  ? Morphine Sulfate Anaphylaxis, Shortness Of Breath and Nausea And Vomiting  ?  Swollen Throat - Able to tolerate dilaudid  ? Penicillins Anaphylaxis, Hives and Shortness Of Breath  ?  Throat swells ?Has patient had a PCN reaction causing immediate rash, facial/tongue/throat swelling, SOB or lightheadedness with hypotension: Yes ?Has patient had a PCN reaction causing severe rash involving mucus membranes or skin necrosis: No ?Has patient had a PCN reaction that required hospitalization: Yes ?Has patient had a PCN reaction occurring within the last 10 years: No ?If all of the above answers are "NO", then may proceed with Cephalosporin use. ?  ? Silicone Rash  ?  Takes off the skin (certain medical tapes do this)  ? Diltiazem Swelling  ? Doxycycline Hives  ? Gabapentin Swelling  ? Midodrine   ?  Lightheaded and falling down  ? Percocet [Oxycodone-Acetaminophen] Rash  ? ? ?OPAT Orders ?Daptomycin '600mg'$  IV every 24 hrs until 11/07/21 ? ?Duration: ?4 weeks total ?End Date: ?11/07/21 ? ?[facility] Care Per Protocol: ? ?Labs weekly while on IV antibiotics: ?_X_ CBC with differential ? ?_X_ CMP ?_X CPK ? ?Please monitor weekly CPK to watch for rhabdomyolysis and cbc for eosinophilia ?While on Daptomycin give '10mg'$  of crestor qd ( instead of '20mg'$ ) as risk for rhabdomyolysis with the 2 medications ? ?_X_ Please pull PIC at completion of IV antibiotics ?_ ? ?Fax weekly labs to [phone] ? ?Clinic Follow Up Appt:with Dr.Susana Duell 11/02/21 at 10.30 AM ? ? ?Call [phone] with critical value or concerns ? ?  ?

## 2021-10-13 NOTE — Plan of Care (Signed)
?  Problem: Clinical Measurements: ?Goal: Ability to avoid or minimize complications of infection will improve ?Outcome: Progressing ?  ?Problem: Skin Integrity: ?Goal: Skin integrity will improve ?Outcome: Progressing ?  ?

## 2021-10-13 NOTE — Progress Notes (Addendum)
PHARMACY CONSULT NOTE FOR: ? ?OUTPATIENT  PARENTERAL ANTIBIOTIC THERAPY (OPAT) ? ?Indication: MRSA bacteremia with chronic wounds ?Regimen:End date: 11/07/2021 ? ? ?See new OPAT written 4/6 ? ?IV antibiotic discharge orders are pended. ?To discharging provider:  please sign these orders via discharge navigator,  ?Select New Orders & click on the button choice - Manage This Unsigned Work.  ?  ? ?Thank you for allowing pharmacy to be a part of this patient's care. ? ?Doreene Eland, PharmD, BCPS, BCIDP ?Work Cell: [phone] ?10/13/2021 12:08 PM ? ? ?

## 2021-10-13 NOTE — CV Procedure (Signed)
Transesophageal echocardiogram preliminary report ? ?Jacarra Igou ?[id number] ?February 10, 1971 ? ?Preliminary diagnosis ? Bacteremia with possible endocarditis ? ?Postprocedural diagnosis ?Normal LV function without evidence of endocarditis or vegetation ? ?Time out ?A timeout was performed by the nursing staff and physicians specifically identifying the procedure performed, identification of the patient, the type of sedation, all allergies and medications, all pertinent medical history, and presedation assessment of nasopharynx. ?The patient and or family understand the risks of the procedure including the rare risks of death, stroke, heart attack, esophogeal perforation, sore throat, and reaction to medications given. ? ?Moderate sedation ?During this procedure the patient has received Versed 2 milligrams and fentanyl 25 micrograms to achieve appropriate moderate sedation.  The patient had continued monitoring of heart rate, oxygenation, blood pressure, respiratory rate, and extent of signs of sedation throughout the entire procedure.  The patient received this moderate sedation over a period of 11 minutes.  Both the nursing staff and I were present during the procedure when the patient had moderate sedation for 100% of the time. ? ?Treatment considerations ? No additional treatment considerations needed for bacteremia due to no current evidence of endocarditis ? ?For further details of transesophageal echocardiogram please refer to final report. ? ?Signed,  ?Corey Skains M.D. FACC ?10/13/2021 8:40 AM ? ?

## 2021-10-13 NOTE — Progress Notes (Signed)
*  PRELIMINARY RESULTS* ?Echocardiogram ?Echocardiogram Transesophageal has been performed. ? ?[name], [name] ?10/13/2021, 8:51 AM ?

## 2021-10-13 NOTE — TOC Progression Note (Signed)
Transition of Care (TOC) - Progression Note  ? ? ?Patient Details  ?Name: [name] ?MRN: [id number] ?Date of Birth: 21-May-1971 ? ?Transition of Care (TOC) CM/SW Contact  ?Marenda Accardi A Lygia Olaes, LCSW ?Phone Number: ?10/13/2021, 9:32 AM ? ?Clinical Narrative:   CSW provided bed offers to spouse and they choose [facility]. [place]. ? ? ? ?Expected Discharge Plan: [place] ?Barriers to Discharge: Continued Medical Work up ? ?Expected Discharge Plan and Services ?Expected Discharge Plan: [place] ?In-house Referral: NA ?  ?Post Acute Care Choice: [place] ?Living arrangements for the past 2 months: [place] ?                ?  ?  ?  ?  ?  ?  ?  ?  ?  ?  ? ? ?Social Determinants of Health (SDOH) Interventions ?  ? ?Readmission Risk Interventions ?   ? View : No data to display.  ?  ?  ?  ? ? ?

## 2021-10-13 NOTE — Progress Notes (Signed)
? ?Date of Admission:  10/07/2021   ID: [name] is a 51 y.o. female   ?Principal Problem: ?  Cellulitis of left lower extremity ?Active Problems: ?  Morbid (severe) obesity due to excess calories ([place]) ?  Tobacco abuse ?  COPD with chronic bronchitis ([place]) ?  Bipolar disorder ([place]) ?  Chronic diastolic CHF (congestive heart failure) ([place]) ?  Uncontrolled type 2 diabetes mellitus with hyperglycemia, with long-term current use of insulin ([place]) ?  PAD (peripheral artery disease) ([place]) ?  Chronic kidney disease, stage 3a ([place]) ?  CAD (coronary artery disease) ?  Breast cancer ([place]) ?  Chronic foot ulcer with necrosis of muscle, left ([place]) ?  Bacteremia ?  Allergic reaction ? ? ? ?Subjective: ?Pt feeling better ? ?Medications:  ? anastrozole  1 mg Oral Daily  ? aspirin EC  81 mg Oral QAC breakfast  ? busPIRone  30 mg Oral BID  ? butamben-tetracaine-benzocaine      ? citalopram  20 mg Oral Daily  ? collagenase   Topical Daily  ? desvenlafaxine  100 mg Oral Daily  ? enoxaparin (LOVENOX) injection  0.5 mg/kg Subcutaneous Q24H  ? fentaNYL      ? insulin aspart  0-15 Units Subcutaneous TID WC  ? insulin aspart  6 Units Subcutaneous TID WC  ? insulin glargine-yfgn  35 Units Subcutaneous BID  ? lidocaine      ? midazolam      ? mometasone-formoterol  2 puff Inhalation BID  ? nicotine  14 mg Transdermal Daily  ? pregabalin  300 mg Oral BID  ? rOPINIRole  1 mg Oral QHS  ? rosuvastatin  20 mg Oral QHS  ? sodium chloride flush      ? spironolactone  50 mg Oral Daily  ? traZODone  200 mg Oral QHS  ? ? ?Objective: ?Vital signs in last 24 hours: ?Temp:  [97.6 ?F (36.4 ?C)-98.2 ?F (36.8 ?C)] 98.1 ?F (36.7 ?C) (04/05 1136) ?Pulse Rate:  [81-87] 83 (04/05 1136) ?Resp:  [13-18] 16 (04/05 1136) ?BP: (100-131)/(56-68) 116/65 (04/05 1136) ?SpO2:  [91 %-100 %] 100 % (04/05 1136) ?Weight:  [103.4 kg] 103.4 kg (04/05 0807) ? ?PHYSICAL EXAM:  ?General: more awake and Alert, cooperative, no distress, pale ?Lungs: b/l air entry ?Heart:  Regular rate and rhythm, no murmur, rub or gallop. ?Abdomen: Soft, non-tender,not distended. Bowel sounds normal. No masses ?Extremities: left BKA site covered with surgical dressing ?Skin: rt leg wound imporving ?Lymph: Cervical, supraclavicular normal. ?Neurologic: Grossly non-focal ? ?Lab Results ?Recent Labs  ?  10/11/21 ?0342 10/12/21 ?0272 10/13/21 ?0518  ?WBC 11.8* 15.5*  --   ?HGB 9.6* 8.8*  --   ?HCT 30.7* 29.3*  --   ?NA 136 138 137  ?K 4.1 5.4* 4.8  ?CL 101 103 102  ?CO2 '26 25 29  '$ ?BUN 38* 33* 32*  ?CREATININE 0.97 1.00 0.98  ? ? ?Microbiology: ? ?Studies/Results: ?No results found. ? ? ?Assessment/Plan: ?MRSA bacteremia with multiple wounds due to MRSA ?Repeat blood culture neg  ?On vanco ?On discharge will switch to dapto for ease of administartion with no monitoring of levels like vanco ?Need to see the CPK down ( high due to BKA) ? ?TEE no evidence of endocarditis ?Will give IV antibiotic for a total of 4 weeks ?  ?Left leg deep wounds including on the heel- s/p BKA ?  ?DM with peripheral neuropathy ?  ?Anemia ?  ?Deconditioning ?  ?Ca bvreast s/p rt mastectomy on arimidex ?  ?  Anxiety/depression on buspar, citalopram, desvenlafaxine, trazadone  ? ?Discussed the management with patient, husband and care team ?  ?

## 2021-10-14 ENCOUNTER — Inpatient Hospital Stay: Payer: Self-pay

## 2021-10-14 DIAGNOSIS — Z1159 Encounter for screening for other viral diseases: Secondary | ICD-10-CM | POA: Diagnosis not present

## 2021-10-14 DIAGNOSIS — Z4781 Encounter for orthopedic aftercare following surgical amputation: Secondary | ICD-10-CM | POA: Diagnosis not present

## 2021-10-14 DIAGNOSIS — Y92199 Unspecified place in other specified residential institution as the place of occurrence of the external cause: Secondary | ICD-10-CM | POA: Diagnosis not present

## 2021-10-14 DIAGNOSIS — R0789 Other chest pain: Secondary | ICD-10-CM | POA: Diagnosis not present

## 2021-10-14 DIAGNOSIS — S299XXA Unspecified injury of thorax, initial encounter: Secondary | ICD-10-CM | POA: Diagnosis not present

## 2021-10-14 DIAGNOSIS — J45909 Unspecified asthma, uncomplicated: Secondary | ICD-10-CM | POA: Diagnosis not present

## 2021-10-14 DIAGNOSIS — B9562 Methicillin resistant Staphylococcus aureus infection as the cause of diseases classified elsewhere: Secondary | ICD-10-CM | POA: Diagnosis not present

## 2021-10-14 DIAGNOSIS — I130 Hypertensive heart and chronic kidney disease with heart failure and stage 1 through stage 4 chronic kidney disease, or unspecified chronic kidney disease: Secondary | ICD-10-CM | POA: Diagnosis not present

## 2021-10-14 DIAGNOSIS — Z4889 Encounter for other specified surgical aftercare: Secondary | ICD-10-CM | POA: Diagnosis not present

## 2021-10-14 DIAGNOSIS — L03116 Cellulitis of left lower limb: Secondary | ICD-10-CM | POA: Diagnosis not present

## 2021-10-14 DIAGNOSIS — M1611 Unilateral primary osteoarthritis, right hip: Secondary | ICD-10-CM | POA: Diagnosis not present

## 2021-10-14 DIAGNOSIS — G894 Chronic pain syndrome: Secondary | ICD-10-CM | POA: Diagnosis not present

## 2021-10-14 DIAGNOSIS — R7881 Bacteremia: Secondary | ICD-10-CM | POA: Diagnosis not present

## 2021-10-14 DIAGNOSIS — Z48817 Encounter for surgical aftercare following surgery on the skin and subcutaneous tissue: Secondary | ICD-10-CM | POA: Diagnosis not present

## 2021-10-14 DIAGNOSIS — I2510 Atherosclerotic heart disease of native coronary artery without angina pectoris: Secondary | ICD-10-CM | POA: Diagnosis not present

## 2021-10-14 DIAGNOSIS — F418 Other specified anxiety disorders: Secondary | ICD-10-CM | POA: Diagnosis not present

## 2021-10-14 DIAGNOSIS — E1165 Type 2 diabetes mellitus with hyperglycemia: Secondary | ICD-10-CM | POA: Diagnosis not present

## 2021-10-14 DIAGNOSIS — L97523 Non-pressure chronic ulcer of other part of left foot with necrosis of muscle: Secondary | ICD-10-CM | POA: Diagnosis not present

## 2021-10-14 DIAGNOSIS — E1129 Type 2 diabetes mellitus with other diabetic kidney complication: Secondary | ICD-10-CM | POA: Diagnosis not present

## 2021-10-14 DIAGNOSIS — L97829 Non-pressure chronic ulcer of other part of left lower leg with unspecified severity: Secondary | ICD-10-CM

## 2021-10-14 DIAGNOSIS — G40909 Epilepsy, unspecified, not intractable, without status epilepticus: Secondary | ICD-10-CM | POA: Diagnosis not present

## 2021-10-14 DIAGNOSIS — Z853 Personal history of malignant neoplasm of breast: Secondary | ICD-10-CM | POA: Diagnosis not present

## 2021-10-14 DIAGNOSIS — J449 Chronic obstructive pulmonary disease, unspecified: Secondary | ICD-10-CM | POA: Diagnosis not present

## 2021-10-14 DIAGNOSIS — I739 Peripheral vascular disease, unspecified: Secondary | ICD-10-CM | POA: Diagnosis not present

## 2021-10-14 DIAGNOSIS — Z452 Encounter for adjustment and management of vascular access device: Secondary | ICD-10-CM | POA: Diagnosis not present

## 2021-10-14 DIAGNOSIS — S7011XA Contusion of right thigh, initial encounter: Secondary | ICD-10-CM | POA: Diagnosis not present

## 2021-10-14 DIAGNOSIS — F319 Bipolar disorder, unspecified: Secondary | ICD-10-CM | POA: Diagnosis not present

## 2021-10-14 DIAGNOSIS — J410 Simple chronic bronchitis: Secondary | ICD-10-CM | POA: Diagnosis not present

## 2021-10-14 DIAGNOSIS — Z89512 Acquired absence of left leg below knee: Secondary | ICD-10-CM | POA: Diagnosis not present

## 2021-10-14 DIAGNOSIS — F17210 Nicotine dependence, cigarettes, uncomplicated: Secondary | ICD-10-CM | POA: Diagnosis not present

## 2021-10-14 DIAGNOSIS — N1831 Chronic kidney disease, stage 3a: Secondary | ICD-10-CM | POA: Diagnosis not present

## 2021-10-14 DIAGNOSIS — T148XXA Other injury of unspecified body region, initial encounter: Secondary | ICD-10-CM | POA: Diagnosis not present

## 2021-10-14 DIAGNOSIS — I5032 Chronic diastolic (congestive) heart failure: Secondary | ICD-10-CM | POA: Diagnosis not present

## 2021-10-14 DIAGNOSIS — I10 Essential (primary) hypertension: Secondary | ICD-10-CM | POA: Diagnosis not present

## 2021-10-14 DIAGNOSIS — M791 Myalgia, unspecified site: Secondary | ICD-10-CM | POA: Diagnosis not present

## 2021-10-14 LAB — CK: Total CK: 919 U/L — ABNORMAL HIGH (ref 38–234)

## 2021-10-14 LAB — CULTURE, BLOOD (ROUTINE X 2)
Culture: NO GROWTH
Culture: NO GROWTH
Special Requests: ADEQUATE
Special Requests: ADEQUATE

## 2021-10-14 LAB — GLUCOSE, CAPILLARY
Glucose-Capillary: 190 mg/dL — ABNORMAL HIGH (ref 70–99)
Glucose-Capillary: 290 mg/dL — ABNORMAL HIGH (ref 70–99)

## 2021-10-14 LAB — CREATININE, SERUM
Creatinine, Ser: 0.89 mg/dL (ref 0.44–1.00)
GFR, Estimated: >60 mL/min (ref >60)

## 2021-10-14 MED ORDER — VANCOMYCIN IV (FOR PTA / DISCHARGE USE ONLY): 1000.0000 mg | INTRAVENOUS | 0 refills | Status: DC

## 2021-10-14 MED ORDER — VANCOMYCIN HCL IN DEXTROSE 1-5 GM/200ML-% IV SOLN
1000.0000 mg | INTRAVENOUS | Status: DC
  Administered 2021-10-14: 1000 mg via INTRAVENOUS
  Filled 2021-10-14: qty 200

## 2021-10-14 MED ORDER — NICOTINE 14 MG/24HR TD PT24: 14.0000 mg | MEDICATED_PATCH | Freq: Every day | TRANSDERMAL | 0 refills | Status: DC

## 2021-10-14 MED ORDER — HYDROCODONE-ACETAMINOPHEN 5-325 MG PO TABS: 1.0000 | ORAL_TABLET | Freq: Four times a day (QID) | ORAL | 0 refills | Status: DC | PRN

## 2021-10-14 MED ORDER — SODIUM CHLORIDE 0.9% FLUSH: 10.0000 mL | INTRAVENOUS | Status: DC | PRN

## 2021-10-14 MED ORDER — CHLORHEXIDINE GLUCONATE CLOTH 2 % EX PADS
6.0000 | MEDICATED_PAD | Freq: Every day | CUTANEOUS | Status: DC
  Administered 2021-10-14: 6 via TOPICAL

## 2021-10-14 MED ORDER — INSULIN GLARGINE 100 UNIT/ML SOLOSTAR PEN: 35.0000 [IU] | PEN_INJECTOR | Freq: Every day | SUBCUTANEOUS | 11 refills | Status: DC

## 2021-10-14 MED ORDER — SODIUM CHLORIDE 0.9% FLUSH
10.0000 mL | Freq: Two times a day (BID) | INTRAVENOUS | Status: DC
  Administered 2021-10-14: 10 mL

## 2021-10-14 MED ORDER — ALPRAZOLAM 1 MG PO TABS: 1.0000 mg | ORAL_TABLET | Freq: Four times a day (QID) | ORAL | 0 refills | Status: AC | PRN

## 2021-10-14 MED ORDER — ROSUVASTATIN CALCIUM 10 MG PO TABS: 10.0000 mg | ORAL_TABLET | Freq: Every day | ORAL | 0 refills | Status: DC

## 2021-10-14 NOTE — Treatment Plan (Addendum)
Diagnosis: ?MRSA bacteremia and MRSA wounds ?Baseline Creatinine - 10/14/21 was 0.89 ? ? ? ?Allergies  ?Allergen Reactions  ? Adhesive [Tape] Rash and Other (See Comments)  ?  TAKES OFF THE SKIN (CERTAIN MEDICAL TAPES DO THIS!!)  ? Metoprolol Shortness Of Breath  ?  Occurrence of shortness of breath after 3 days  ? Montelukast Shortness Of Breath  ? Morphine Sulfate Anaphylaxis, Shortness Of Breath and Nausea And Vomiting  ?  Swollen Throat - Able to tolerate dilaudid  ? Penicillins Anaphylaxis, Hives and Shortness Of Breath  ?  Throat swells ?Has patient had a PCN reaction causing immediate rash, facial/tongue/throat swelling, SOB or lightheadedness with hypotension: Yes ?Has patient had a PCN reaction causing severe rash involving mucus membranes or skin necrosis: No ?Has patient had a PCN reaction that required hospitalization: Yes ?Has patient had a PCN reaction occurring within the last 10 years: No ?If all of the above answers are "NO", then may proceed with Cephalosporin use. ?  ? Silicone Rash  ?  Takes off the skin (certain medical tapes do this)  ? Diltiazem Swelling  ? Doxycycline Hives  ? Gabapentin Swelling  ? Midodrine   ?  Lightheaded and falling down  ? Percocet [Oxycodone-Acetaminophen] Rash  ? ? ?OPAT Orders ?Discharge antibiotics: ?Vancomycin 1 gram IV every 24 hours at 10 am ? ?Adjust vanco mycin according to your  pharmacy protocol  ?Aim for Vancomycin trough 15-20 (unless otherwise indicated) ?Duration: ?Total of 4 weeks ?End Date: 11/07/21 ?IF creatinoine increases inspite of vanco dose adjustment will need to change the antibiotic to daptomycin ? ? ?[facility] Care Per Protocol: ? ?Labs weekly while on IV antibiotics: ?_X_ CBC with differential- Monday ?X LFT on Monday ?_X_ BMP- Monday and Thursday  ?_X_ Vancomycin trough- Monday and Thursday ? ?_X_ Please pull PIC at completion of IV antibiotics ? ?Fax weekly labs to [phone] ? ?Clinic Follow Up Appt:11/02/21 at 10.30 AM ? ? ?Call [phone]  with  any concerns or questions ?  ?

## 2021-10-14 NOTE — Progress Notes (Signed)
Blood pressure 121/61, pulse 85, temperature 98.2 ?F (36.8 ?C), resp. rate 17, height '5\' 4"'$  (1.626 m), weight 103.4 kg, SpO2 94 %. ?Picc line in place when arriving to [facility]. All notes and scripts placed in packet. LMOM for facility to contact me back for report on pt since she had stepped off the floor. Pt d/c  with all belongings transported via ems to facility.  ?

## 2021-10-14 NOTE — Progress Notes (Incomplete)
OPAT Orders ?Discharge antibiotics: ?Vancomycin 1 gram IV every 24 hours at 10 am ?  ?Adjust vanco mycin according to your  pharmacy protocol  ?Aim for Vancomycin trough 15-20 (unless otherwise indicated) ?Duration: ?Total of 4 weeks ?End Date: 11/07/21 ?IF creatinoine increases inspite of vanco dose adjustment will need to change the antibiotic to daptomycin ?  ?  ?[facility] Care Per Protocol: ?  ?Labs weekly while on IV antibiotics: ?_X_ CBC with differential- Monday ?X LFT on Monday ?_X_ BMP- Monday and Thursday  ?_X_ Vancomycin trough- Monday and Thursday ?  ?_X_ Please pull PIC at completion of IV antibiotics ?  ?Fax weekly labs to [phone] ?  ?Clinic Follow Up Appt:11/02/21 at 10.30 AM ?  ?  ?Call [phone] with  any concerns or questions ?

## 2021-10-14 NOTE — Progress Notes (Signed)
Pharmacy Antibiotic Note ? ?[name] is a 51 y.o. female admitted on 10/07/2021 with MRSA bacteremia.  Pharmacy has been consulted for vancomycin dosing. Patient has chronic lower extremity wounds w/ cellulitis noted on L leg. She is s/p I&D of BL ulcers on 3/14 (no cultures taken).  She was discharged on clindamycin. Abscess culture from January grew MRSA, appears has I&D of scalp and abdominal wall abscesses at that time. ID following. She had Left BKA on 4/3 ? ?Today, 10/14/2021 ?Day #7 antibiotics ?Renal: SCr stable 0.89 ?3/31 Bcx MRSA ?3/31 foot culture: MRSA ?4/1 repeat Bcx: NGTD ?TEE 4/4 no evidence of endocarditis ?4/2 vancomycin levels '750mg'$  IV q12h ?Dose given at 10:36 ?Peak at 11:48 = 43 mcg/mL (note drawn early - not enough time to distribute) ?Trough at 21:05 = 25 mcg/mL ?CK levels:  ?4/5 786 ?4/6 919 ? ?Plan: ?Plan to change to daptomycin today in preparation for discharge to Rehab facility but CK is elevated and trending upward so will avoid daptomycin ?Continue vancomycin 1gm IV q24h based on levels from 4/2 ?Calculated AUC = 515 (Goal 400-600) ?Follow renal function ? ? ?Height: '5\' 4"'$  (162.6 cm) ?Weight: 103.4 kg (227 lb 15.3 oz) ?IBW/kg (Calculated) : 54.7 ? ?Temp (24hrs), Avg:98.3 ?F (36.8 ?C), Min:97.9 ?F (36.6 ?C), Max:98.8 ?F (37.1 ?C) ? ?Recent Labs  ?Lab 10/07/21 ?1511 10/08/21 ?8756 10/09/21 ?4332 10/10/21 ?9518 10/10/21 ?1148 10/10/21 ?2105 10/11/21 ?8416 10/12/21 ?6063 10/13/21 ?0518 10/14/21 ?0160  ?WBC  --  16.9* 18.3* 14.5*  --   --  11.8* 15.5*  --   --   ?CREATININE  --  1.04* 1.21* 1.01*  --   --  0.97 1.00 0.98 0.89  ?LATICACIDVEN 1.8  --   --   --   --   --   --   --   --   --   ?[place]  --   --   --   --   --  25*  --   --   --   --   ?VANCOPEAK  --   --   --   --  45*  --   --   --   --   --   ? ?  ?Estimated Creatinine Clearance: 86.6 mL/min (by C-G formula based on SCr of 0.89 mg/dL).   ? ?Allergies  ?Allergen Reactions  ? Adhesive [Tape] Rash and Other (See Comments)  ?   TAKES OFF THE SKIN (CERTAIN MEDICAL TAPES DO THIS!!)  ? Metoprolol Shortness Of Breath  ?  Occurrence of shortness of breath after 3 days  ? Montelukast Shortness Of Breath  ? Morphine Sulfate Anaphylaxis, Shortness Of Breath and Nausea And Vomiting  ?  Swollen Throat - Able to tolerate dilaudid  ? Penicillins Anaphylaxis, Hives and Shortness Of Breath  ?  Throat swells ?Has patient had a PCN reaction causing immediate rash, facial/tongue/throat swelling, SOB or lightheadedness with hypotension: Yes ?Has patient had a PCN reaction causing severe rash involving mucus membranes or skin necrosis: No ?Has patient had a PCN reaction that required hospitalization: Yes ?Has patient had a PCN reaction occurring within the last 10 years: No ?If all of the above answers are "NO", then may proceed with Cephalosporin use. ?  ? Silicone Rash  ?  Takes off the skin (certain medical tapes do this)  ? Diltiazem Swelling  ? Doxycycline Hives  ? Gabapentin Swelling  ? Midodrine   ?  Lightheaded and falling down  ? Percocet [Oxycodone-Acetaminophen] Rash  ? ? ?  Antimicrobials this admission: ?Ceftriaxone 3/30 >>3/30 ?Metronidazole 3/31 >> 3/31 ?Vancomycin 3/30 >> ? ?Dose adjustments this admission: ? ? ?Microbiology results: ?3/30 BCx: GPC in 2 of 4 bottles, BCID = MRSA ?4/01 BCx: NGTD ?3/30 UCx: multiple species ? ?Thank you for allowing pharmacy to be a part of this patient?s care. ? ?Doreene Eland, PharmD, BCPS, BCIDP ?Work Cell: [phone] ?10/14/2021 8:36 AM ? ? ? ? ? ? ?

## 2021-10-14 NOTE — Progress Notes (Signed)
Physical Therapy Treatment ?Patient Details ?Name: [name] ?MRN: [id number] ?DOB: [birth year] ?Today's Date: 10/14/2021 ? ? ?History of Present Illness Pt is a 51 yo female with PMH PVD, HTN, HLD, DMII, COPD, asthma, CVA, GERD, depression/anxiety, tobacco abuse, seizure d/o, anemia, CAD, CKD3a, dCHF, right breast cancer (s/p mastectomy) who presented with a left foot ulcer/infection and BLE lower leg pain.  Pt diagnosed with atherosclerotic occlusive disease bilateral lower extremities with gangrenous changes to the left heel. Pt underwent surgical debridement with podiatry on 09/21/2021.  She then underwent abdominal aortogram and left lower extremity angiogram with vascular surgery on 09/22/2021.  Pt had R BKA performed on 10/11/21. ? ?  ?PT Comments  ? ? Pt was long sitting in bed upon arriving. She is A and O x 4 and agreeable to session. Pt was very motivated. Issued HEP handout and pt demonstrated proper performance. She was able to exit L side of bed with min assist + increased time. Stood with max assist with Chief Strategy Officer supporting pt under BUEs. On 2nd standing trial, pt perform pivot towards R to recliner. Overall pt is progressing well. Recommend DC to SNF to address deficits while maximizing pt's independence with all ADLs. ?  ?Recommendations for follow up therapy are one component of a multi-disciplinary discharge planning process, led by the attending physician.  Recommendations may be updated based on patient status, additional functional criteria and insurance authorization. ? ?Follow Up Recommendations ? Skilled nursing-short term rehab (<3 hours/day) ?  ?  ?Assistance Recommended at Discharge Frequent or constant Supervision/Assistance  ?Patient can return home with the following A lot of help with walking and/or transfers;A lot of help with bathing/dressing/bathroom;Assistance with cooking/housework;Assistance with feeding;Direct supervision/assist for medications management;Direct supervision/assist for  financial management;Assist for transportation;Help with stairs or ramp for entrance ?  ?Equipment Recommendations ? Rolling walker (2 wheels);BSC/3in1;Wheelchair cushion (measurements PT)  ?  ?Recommendations for Other Services   ? ? ?  ?Precautions / Restrictions Precautions ?Precautions: Fall ?Restrictions ?Weight Bearing Restrictions: Yes ?LLE Weight Bearing: Non weight bearing  ?  ? ?Mobility ? Bed Mobility ?Overal bed mobility: Needs Assistance ?Bed Mobility: Supine to Sit, Sit to Supine, Rolling ?Rolling: Supervision ?  ?Supine to sit: Min assist ?  ?  ?General bed mobility comments: pt required increased time and min assist to exit L side of bed ?  ? ?Transfers ?Overall transfer level: Needs assistance ?Equipment used: Rolling walker (2 wheels) ?Transfers: Sit to/from Stand ?Sit to Stand: Max assist ?  ? General transfer comment: pt stood 2 x. 1 x STS EOB and 1 x standpivot to recliner towards R. pt was able to stand for ~ 30 sec each trial and did require max assist to wt shift to allow pivot on RLE to recliner. ?  ? ?Ambulation/Gait ?   ?General Gait Details: unable (acute BKA) ? ?  ?Balance Overall balance assessment: Needs assistance ?Sitting-balance support: Bilateral upper extremity supported ?Sitting balance-Leahy Scale: Normal ?  ?  ?Standing balance support: Bilateral upper extremity supported, During functional activity, Reliant on assistive device for balance ?Standing balance-Leahy Scale: Fair ?  ?  ? ?  ?Cognition Arousal/Alertness: Awake/alert ?Behavior During Therapy: [facility] for tasks assessed/performed ?Overall Cognitive Status: Within Functional Limits for tasks assessed ?   ?General Comments: Pt is A and O x 4 ?  ?  ? ?  ?   ?General Comments General comments (skin integrity, edema, etc.): issued HEP and pt demonstrated understanding of correct performance. ?  ?  ? ?Pertinent  Vitals/Pain Pain Assessment ?Pain Assessment: No/denies pain ?Pain Score: 0-No pain ?Pain Descriptors / Indicators:  Discomfort ?Pain Intervention(s): Limited activity within patient's tolerance, Monitored during session, Repositioned  ? ? ? ?PT Goals (current goals can now be found in the care plan section) Acute Rehab PT Goals ?Patient Stated Goal: To get back home ?Progress towards PT goals: Progressing toward goals ? ?  ?Frequency ? ? ? 7X/week ? ? ? ?  ?PT Plan Current plan remains appropriate  ? ? ?   ?AM-PAC PT "6 Clicks" Mobility   ?Outcome Measure ? Help needed turning from your back to your side while in a flat bed without using bedrails?: A Little ?Help needed moving from lying on your back to sitting on the side of a flat bed without using bedrails?: A Little ?Help needed moving to and from a bed to a chair (including a wheelchair)?: A Lot ?Help needed standing up from a chair using your arms (e.g., wheelchair or bedside chair)?: A Lot ?Help needed to walk in hospital room?: Total ?Help needed climbing 3-5 steps with a railing? : Total ?6 Click Score: 12 ? ?  ?End of Session Equipment Utilized During Treatment: Gait belt ?Activity Tolerance: Patient tolerated treatment well ?Patient left: in chair;with call bell/phone within reach;with chair alarm set ?Nurse Communication: Mobility status ?PT Visit Diagnosis: Unsteadiness on feet (R26.81);Muscle weakness (generalized) (M62.81);History of falling (Z91.81);Difficulty in walking, not elsewhere classified (R26.2) ?  ? ? ?Time: 3614-4315 ?PT Time Calculation (min) (ACUTE ONLY): 26 min ? ?Charges:  $Therapeutic Exercise: 8-22 mins ?$Therapeutic Activity: 8-22 mins          ?          ?Julaine Fusi PTA ?10/14/21, 9:25 AM  ? ?

## 2021-10-14 NOTE — Progress Notes (Signed)
PHARMACY CONSULT NOTE FOR: ? ?OUTPATIENT  PARENTERAL ANTIBIOTIC THERAPY (OPAT) ? ?Indication: MRSA bacteremia and chronic wounds ?Regimen: vancomycin 1gm IV q24h  ?End date: 11/07/2021 ? ?Unable to use daptomycin as planned due to rising CK level. ? ?IV antibiotic discharge orders are pended. ?To discharging provider:  please sign these orders via discharge navigator,  ?Select New Orders & click on the button choice - Manage This Unsigned Work.  ?  ? ?Thank you for allowing pharmacy to be a part of this patient's care. ? ?Doreene Eland, PharmD, BCPS, BCIDP ?Work Cell: [phone] ?10/14/2021 8:42 AM ? ? ?

## 2021-10-14 NOTE — Discharge Summary (Addendum)
?Physician Discharge Summary ?  ?Patient: [name] MRN: [id number] DOB: June 05, 1971  ?Admit date:     10/07/2021  ?Discharge date: 10/14/21  ?Discharge Physician: [name]  ? ?PCP: Pleas Koch, NP  ? ?Recommendations at discharge:  ? ?follow-up PCP in 1 to 2 weeks ? follow-up with Dr. Lucky Cowboy in 1 to 2 weeks] ? ?Discharge Diagnoses: ?MRSA Bacteremia-- source lower extremity ?Left lower extremity gangrene status post left BKA ? ?Hospital Course: ? ?[name] is a 51 yo female with PMH PVD, HTN, HLD, DMII, COPD, asthma, CVA, GERD, depression/anxiety, tobacco abuse, seizure d/o, anemia, CAD, CKD3a, dCHF, right breast cancer (s/p mastectomy) who presented with worsening pain and swelling in her left foot and a nonhealing wound. ?  ?She was recently hospitalized from 09/20/2021 until 09/23/2021 for a diabetic infection involving her left foot and she had undergone debridement on 09/21/2021 with podiatry in the OR.  She also had vascular surgery evaluation with abdominal aortogram and left lower extremity angiogram (no significant stenosis/no intervention required).  ?  ?Left lower extremity gangrene/cellulitis ?-- concern for healing of wound poor. Vascular consultation was obtained with Dr. Lucky Cowboy. Patient is status post left BKA 10/11/2021 ?-- initiated ?-- TOC for discharge planning--pt has chosen [facility] rehab ?  ?M RSA bacteremia ?-- infectious disease consultation appreciated ?-- TTE negative for vegetation ?-- continue IV vancomycin ?---TEE--no vegetation ?--per ID pt will be on IV Vancomycin for 4 weeks.  ?--Will get PICC placed today (4/6) ?- ?OPAT Orders ?Discharge antibiotics: ?Vancomycin 1 grm Iv every 24 hours at 10 am ? Adjust vanco mycin according to your  pharmacy protocol  ?Aim for Vancomycin trough 15-20 (unless otherwise indicated) ?Duration: ?Total of 4 weeks ?End Date: 11/07/21 ?IF creatinoine increases inspite of vanco dose adjustment will need to change the antibiotic to daptomycin ?  ?[facility] Care Per  Protocol: ?  ?Labs weekly while on IV antibiotics: ?_X_ CBC with differential- Monday ?X LFT on Monday ?_X_ BMP- Monday and Thursday  ?_X_ Vancomycin trough- Monday and Thursday ?  ?_X_ Please pull PIC at completion of IV antibiotics ?  ?Fax weekly labs to [phone] ?  ?Clinic Follow Up Appt:11/02/21 at 10.30 AM ? ?Uncontrolled DM-2 with CKD 3a ?-- patient advised to continue carb control diet.  ?-- will discharge patient on Lantus, sliding scale and PO home meds ?  ?PAD ?--underwent angiogram of abdomen and left lower extremity on 09/22/2021 with no significant stenosis nor stenting required ?-Continue aspirin and Crestor (now on 10 mg qd) ?  ?chronic diastolic heart failure ?-- stable ?-- no signs symptoms of exacerbation ?-- continue aspirin and Spironolactone ?  ?Breast cancer ([place]) ?- Continue anastrozole ?- s/p right mastectomy  ?  ?CAD (coronary artery disease) ?Continue aspirin and Crestor ?  ?Chronic kidney disease, stage 3a ([place]) ?- patient has history of CKD3a. Baseline creat ~ 1 - 1.1,  ?  ?Bipolar disorder ([place]) ?Continue trazodone, Pristiq, buspirone and Celexa ?  ?COPD with chronic bronchitis ([place]) ?Not acutely exacerbated ?Continue as needed bronchodilator therapy as well as inhaled steroids ?  ?Tobacco abuse ?Smoking cessation discussed with patient ?-Continue nicotine patch ? ?patient will discharged to rehab once dressing change will be done today and stump evaluation done by ID. Discussed with patient about discharge plan she was in agreement. ?  ?Procedures: left BKA 10/11/2021 ?Family communication :none today ?Consults : podiatry, vascular, ID, cardiology ?CODE STATUS: full ?DVT Prophylaxis : enoxaparin ?Level of care: Med-Surg ? ?  ? ? ?Disposition:  Rehabilitation facility ?Diet recommendation:  ?Cardiac and Carb modified diet ?DISCHARGE MEDICATION: ?Allergies as of 10/14/2021   ? ?   Reactions  ? Adhesive [tape] Rash, Other (See Comments)  ? TAKES OFF THE SKIN (CERTAIN MEDICAL TAPES DO  THIS!!)  ? Metoprolol Shortness Of Breath  ? Occurrence of shortness of breath after 3 days  ? Montelukast Shortness Of Breath  ? Morphine Sulfate Anaphylaxis, Shortness Of Breath, Nausea And Vomiting  ? Swollen Throat - Able to tolerate dilaudid  ? Penicillins Anaphylaxis, Hives, Shortness Of Breath  ? Throat swells ?Has patient had a PCN reaction causing immediate rash, facial/tongue/throat swelling, SOB or lightheadedness with hypotension: Yes ?Has patient had a PCN reaction causing severe rash involving mucus membranes or skin necrosis: No ?Has patient had a PCN reaction that required hospitalization: Yes ?Has patient had a PCN reaction occurring within the last 10 years: No ?If all of the above answers are "NO", then may proceed with Cephalosporin use.  ? Silicone Rash  ? Takes off the skin (certain medical tapes do this)  ? Diltiazem Swelling  ? Doxycycline Hives  ? Gabapentin Swelling  ? Midodrine   ? Lightheaded and falling down  ? Percocet [oxycodone-acetaminophen] Rash  ? ?  ? ?  ?Medication List  ?  ? ?STOP taking these medications   ? ?gentamicin cream 0.1 % ?Commonly known as: GARAMYCIN ?  ?mupirocin ointment 2 % ?Commonly known as: BACTROBAN ?  ?oxyCODONE-acetaminophen 5-325 MG tablet ?Commonly known as: Percocet ?  ? ?  ? ?TAKE these medications   ? ?acetaminophen 325 MG tablet ?Commonly known as: TYLENOL ?Take 2 tablets (650 mg total) by mouth every 4 (four) hours as needed for headache or mild pain. ?  ?albuterol 108 (90 Base) MCG/ACT inhaler ?Commonly known as: VENTOLIN HFA ?Inhale 2 puffs into the lungs every 6 (six) hours as needed for wheezing or shortness of breath. ?  ?ALPRAZolam 1 MG tablet ?Commonly known as: Duanne Moron ?Take 1 tablet (1 mg total) by mouth 4 (four) times daily as needed for anxiety. ?  ?anastrozole 1 MG tablet ?Commonly known as: ARIMIDEX ?Take 1 tablet (1 mg total) by mouth daily. ?  ?aspirin EC 81 MG tablet ?Take 81 mg by mouth daily before breakfast. ?  ?blood glucose meter  kit and supplies ?Use up to four times daily as directed. (FOR ICD-10 E10.9, E11.9). ?  ?budesonide-formoterol 80-4.5 MCG/ACT inhaler ?Commonly known as: Symbicort ?INHALE 2 PUFFS BY MOUTH EVERY 12 HOURS TO PREVENT COUGH OR WHEEZING *RINSE MOUTH AFTER EACH USE* ?  ?busPIRone 30 MG tablet ?Commonly known as: BUSPAR ?Take 30 mg by mouth 2 (two) times daily. ?  ?citalopram 20 MG tablet ?Commonly known as: CELEXA ?Take 1 tablet (20 mg total) by mouth daily. ?  ?desvenlafaxine 100 MG 24 hr tablet ?Commonly known as: PRISTIQ ?Take 100 mg by mouth daily. ?  ?Empagliflozin-metFORMIN HCl ER 25-1000 MG Tb24 ?Take 1 tablet by mouth daily with breakfast. ?  ?Eylea 2 MG/0.05ML Sosy ?Generic drug: Aflibercept ?  ?glucose blood test strip ?Use as instructed ?  ?HYDROcodone-acetaminophen 5-325 MG tablet ?Commonly known as: NORCO/VICODIN ?Take 1-2 tablets by mouth every 6 (six) hours as needed for moderate pain. ?  ?insulin aspart 100 UNIT/ML FlexPen ?Commonly known as: NOVOLOG ?Inject 1-9 Units into the skin 4 (four) times daily -  before meals and at bedtime. Glucose 121 - 150: 1 unit, Glucose 151 - 200: 2 units, Glucose 201 - 250: 3 units, Glucose 251 - 300:  5 units, Glucose 301 - 350: 7 units, Glucose 351 - 400: 9 units, Glucose > 400 call MD ?  ?insulin glargine 100 UNIT/ML Solostar Pen ?Commonly known as: LANTUS ?Inject 35 Units into the skin daily. ?What changed: how much to take ?  ?Insulin Pen Needle 32G X 4 MM Misc ?Inject 1 each into the skin 4 (four) times daily -  before meals and at bedtime. Used to give insulin injections twice daily. ?  ?nicotine 14 mg/24hr patch ?Commonly known as: NICODERM CQ - dosed in mg/24 hours ?Place 1 patch (14 mg total) onto the skin daily. ?Start taking on: October 15, 2021 ?  ?ondansetron 8 MG tablet ?Commonly known as: ZOFRAN ?One pill every 8 hours as needed for nausea/vomitting. ?  ?OXYGEN ?Inhale 2-4 L into the lungs 2 (two) times daily as needed (shortness of breath). ?  ?pregabalin 300  MG capsule ?Commonly known as: LYRICA ?Take 1 capsule (300 mg total) by mouth 2 (two) times daily. For nerve pain. ?  ?ReliOn Ultra Thin Lancets 30G Misc ?Use as directed for blood sugar checks. ?  ?rOPINIRo

## 2021-10-14 NOTE — Progress Notes (Addendum)
[facility] ? ?Daily Progress Note ? ? ?Subjective  -  ? ?Patient postop day 3, status post left below-knee amputation due to nonhealing ulceration.  Patient in good spirits.  Some tenderness in her thigh area but overall doing well. ? ?Objective ?Vitals:  ? 10/14/21 0042 10/14/21 0318 10/14/21 0803 10/14/21 1124  ?BP: (!) 100/53 125/70 127/66 121/61  ?Pulse: 81 85 84 85  ?Resp: '16 14 16 17  '$ ?Temp: 98.2 ?F (36.8 ?C) 98.1 ?F (36.7 ?C) 98.8 ?F (37.1 ?C) 98.2 ?F (36.8 ?C)  ?TempSrc:      ?SpO2: 97% 98% 100% 94%  ?Weight:      ?Height:      ? ? ?Intake/Output Summary (Last 24 hours) at 10/14/2021 1425 ?Last data filed at 10/14/2021 1400 ?Gross per 24 hour  ?Intake --  ?Output 2820 ml  ?Net -2820 ml  ? ? ?PULM  CTAB ?CV  RRR ?VASC  incision clean and dry with no evidence of significant drainage.  See attached picture ? ? ? ? ?Laboratory ?CBC ?   ?Component Value Date/Time  ? WBC 15.5 (H) 10/12/2021 0343  ? HGB 8.8 (L) 10/12/2021 0343  ? HGB 12.2 09/16/2021 0847  ? HCT 29.3 (L) 10/12/2021 0343  ? HCT 38.0 09/16/2021 0847  ? PLT 286 10/12/2021 0343  ? PLT 200 09/16/2021 0847  ? ? ?BMET ?   ?Component Value Date/Time  ? NA 137 10/13/2021 0518  ? NA 137 01/08/2018 1117  ? K 4.8 10/13/2021 0518  ? CL 102 10/13/2021 0518  ? CO2 29 10/13/2021 0518  ? GLUCOSE 435 (H) 10/13/2021 0518  ? BUN 32 (H) 10/13/2021 0518  ? BUN 31 (H) 01/08/2018 1117  ? CREATININE 0.89 10/14/2021 0548  ? CREATININE 1.59 (H) 07/18/2019 1506  ? CALCIUM 8.1 (L) 10/13/2021 0518  ? GFRNONAA >60 10/14/2021 0548  ? GFRAA 51 (L) 02/25/2020 1504  ? ? ?Assessment/Planning: ?POD #3 s/p left below-knee amputation ? ?Wound is doing well. ?Discussed with patient the absolute importance of maintaining a straight knee in preparation for prosthesis  ?Dry dressing applied today without issue ?Will have patient return to clinic in 3 weeks for staple removal. ? ?Kris Hartmann ? ?10/14/2021, 2:25 PM ? ? ? ?  ?

## 2021-10-14 NOTE — TOC Transition Note (Signed)
Transition of Care (TOC) - CM/SW Discharge Note ? ? ?Patient Details  ?Name: [name] ?MRN: [id number] ?Date of Birth: [birth year] ? ?Transition of Care (TOC) CM/SW Contact:  ?Honor Frison A Casy Tavano, LCSW ?Phone Number: ?10/14/2021, 3:54 PM ? ? ?Clinical Narrative:   Clinical Social Worker facilitated patient discharge including contacting patient family and facility to confirm patient discharge plans.  Clinical information faxed to facility and family agreeable with plan.  CSW arranged ambulance transport via [facility] to [facility] room 128.  RN to call [phone] for report prior to discharge. ? ? ?Final next level of care: [place] ?Barriers to Discharge: No Barriers Identified ? ? ?Patient Goals and CMS Choice ?Patient states their goals for this hospitalization and ongoing recovery are:: to go to SNF ?  ?Choice offered to / list presented to : Patient, Spouse ? ?Discharge Placement ?  ?           ?Patient [facility] bed at:  [facility]) ?Patient to be transferred to facility by: [facility] ?  ?Patient and family notified of of transfer: 10/14/21 ? ?Discharge Plan and Services ?In-house Referral: NA ?  ?Post Acute Care Choice: [place]          ?  ?  ?  ?  ?  ?  ?  ?  ?  ?  ? ?Social Determinants of Health (SDOH) Interventions ?  ? ? ?Readmission Risk Interventions ?   ? View : No data to display.  ?  ?  ?  ? ? ? ? ? ?

## 2021-10-14 NOTE — Progress Notes (Signed)
Peripherally Inserted Central Catheter Placement ? ?The IV Nurse has discussed with the patient and/or persons authorized to consent for the patient, the purpose of this procedure and the potential benefits and risks involved with this procedure.  The benefits include less needle sticks, lab draws from the catheter, and the patient may be discharged home with the catheter. Risks include, but not limited to, infection, bleeding, blood clot (thrombus formation), and puncture of an artery; nerve damage and irregular heartbeat and possibility to perform a PICC exchange if needed/ordered by physician.  Alternatives to this procedure were also discussed.  Bard Power PICC patient education guide, fact sheet on infection prevention and patient information card has been provided to patient /or left at bedside.   ? ?PICC Placement Documentation  ?PICC Single Lumen 16/38/45 Left Cephalic 43 cm 0 cm (Active)  ?Indication for Insertion or Continuance of Line Prolonged intravenous therapies 10/14/21 1045  ?Exposed Catheter (cm) 0 cm 10/14/21 1045  ?Site Assessment Clean, Dry, Intact 10/14/21 1045  ?Line Status Flushed;Blood return noted;Saline locked 10/14/21 1045  ?Dressing Type Transparent 10/14/21 1045  ?Dressing Status Antimicrobial disc in place 10/14/21 1045  ?Dressing Change Due 10/21/21 10/14/21 1045  ? ? ? ? ? ?[name] ?10/14/2021, 10:53 AM ? ?

## 2021-10-14 NOTE — Discharge Instructions (Signed)
Left BKA dressing instructions per Vascular surgery ?

## 2021-10-15 ENCOUNTER — Other Ambulatory Visit: Payer: Self-pay

## 2021-10-15 ENCOUNTER — Emergency Department
Admission: EM | Admit: 2021-10-15 | Discharge: 2021-10-15 | Disposition: A | Discharge status: Home / Self Care | Payer: BC Managed Care – PPO | Attending: Emergency Medicine | Admitting: Emergency Medicine

## 2021-10-15 ENCOUNTER — Emergency Department: Payer: BC Managed Care – PPO

## 2021-10-15 ENCOUNTER — Encounter: Payer: Self-pay | Admitting: Emergency Medicine

## 2021-10-15 DIAGNOSIS — F17200 Nicotine dependence, unspecified, uncomplicated: Secondary | ICD-10-CM | POA: Diagnosis present

## 2021-10-15 DIAGNOSIS — M791 Myalgia, unspecified site: Secondary | ICD-10-CM | POA: Diagnosis not present

## 2021-10-15 DIAGNOSIS — I5032 Chronic diastolic (congestive) heart failure: Secondary | ICD-10-CM | POA: Insufficient documentation

## 2021-10-15 DIAGNOSIS — J45909 Unspecified asthma, uncomplicated: Secondary | ICD-10-CM | POA: Insufficient documentation

## 2021-10-15 DIAGNOSIS — M7918 Myalgia, other site: Secondary | ICD-10-CM

## 2021-10-15 DIAGNOSIS — I10 Essential (primary) hypertension: Secondary | ICD-10-CM

## 2021-10-15 DIAGNOSIS — Z452 Encounter for adjustment and management of vascular access device: Secondary | ICD-10-CM | POA: Diagnosis not present

## 2021-10-15 DIAGNOSIS — S299XXA Unspecified injury of thorax, initial encounter: Secondary | ICD-10-CM | POA: Diagnosis not present

## 2021-10-15 DIAGNOSIS — S7011XA Contusion of right thigh, initial encounter: Secondary | ICD-10-CM | POA: Diagnosis not present

## 2021-10-15 DIAGNOSIS — C50919 Malignant neoplasm of unspecified site of unspecified female breast: Secondary | ICD-10-CM | POA: Diagnosis present

## 2021-10-15 DIAGNOSIS — R7881 Bacteremia: Secondary | ICD-10-CM | POA: Diagnosis not present

## 2021-10-15 DIAGNOSIS — I130 Hypertensive heart and chronic kidney disease with heart failure and stage 1 through stage 4 chronic kidney disease, or unspecified chronic kidney disease: Secondary | ICD-10-CM | POA: Insufficient documentation

## 2021-10-15 DIAGNOSIS — Z8673 Personal history of transient ischemic attack (TIA), and cerebral infarction without residual deficits: Secondary | ICD-10-CM

## 2021-10-15 DIAGNOSIS — F17210 Nicotine dependence, cigarettes, uncomplicated: Secondary | ICD-10-CM | POA: Diagnosis not present

## 2021-10-15 DIAGNOSIS — Z720 Tobacco use: Secondary | ICD-10-CM | POA: Diagnosis present

## 2021-10-15 DIAGNOSIS — J449 Chronic obstructive pulmonary disease, unspecified: Secondary | ICD-10-CM | POA: Diagnosis not present

## 2021-10-15 DIAGNOSIS — F319 Bipolar disorder, unspecified: Secondary | ICD-10-CM | POA: Diagnosis present

## 2021-10-15 DIAGNOSIS — N1831 Chronic kidney disease, stage 3a: Secondary | ICD-10-CM | POA: Diagnosis not present

## 2021-10-15 DIAGNOSIS — E1165 Type 2 diabetes mellitus with hyperglycemia: Secondary | ICD-10-CM | POA: Insufficient documentation

## 2021-10-15 DIAGNOSIS — G894 Chronic pain syndrome: Secondary | ICD-10-CM

## 2021-10-15 DIAGNOSIS — B9562 Methicillin resistant Staphylococcus aureus infection as the cause of diseases classified elsewhere: Secondary | ICD-10-CM | POA: Insufficient documentation

## 2021-10-15 DIAGNOSIS — Z48817 Encounter for surgical aftercare following surgery on the skin and subcutaneous tissue: Secondary | ICD-10-CM | POA: Diagnosis not present

## 2021-10-15 DIAGNOSIS — Z4889 Encounter for other specified surgical aftercare: Secondary | ICD-10-CM | POA: Diagnosis not present

## 2021-10-15 DIAGNOSIS — I2510 Atherosclerotic heart disease of native coronary artery without angina pectoris: Secondary | ICD-10-CM | POA: Diagnosis not present

## 2021-10-15 DIAGNOSIS — Z89512 Acquired absence of left leg below knee: Secondary | ICD-10-CM | POA: Diagnosis not present

## 2021-10-15 DIAGNOSIS — Y92199 Unspecified place in other specified residential institution as the place of occurrence of the external cause: Secondary | ICD-10-CM | POA: Insufficient documentation

## 2021-10-15 DIAGNOSIS — A4902 Methicillin resistant Staphylococcus aureus infection, unspecified site: Secondary | ICD-10-CM

## 2021-10-15 DIAGNOSIS — Z853 Personal history of malignant neoplasm of breast: Secondary | ICD-10-CM | POA: Insufficient documentation

## 2021-10-15 DIAGNOSIS — I739 Peripheral vascular disease, unspecified: Secondary | ICD-10-CM | POA: Diagnosis present

## 2021-10-15 DIAGNOSIS — R0789 Other chest pain: Secondary | ICD-10-CM | POA: Insufficient documentation

## 2021-10-15 DIAGNOSIS — E785 Hyperlipidemia, unspecified: Secondary | ICD-10-CM

## 2021-10-15 DIAGNOSIS — R748 Abnormal levels of other serum enzymes: Secondary | ICD-10-CM | POA: Diagnosis present

## 2021-10-15 DIAGNOSIS — E1129 Type 2 diabetes mellitus with other diabetic kidney complication: Secondary | ICD-10-CM | POA: Diagnosis present

## 2021-10-15 DIAGNOSIS — M1611 Unilateral primary osteoarthritis, right hip: Secondary | ICD-10-CM | POA: Diagnosis not present

## 2021-10-15 DIAGNOSIS — F418 Other specified anxiety disorders: Secondary | ICD-10-CM

## 2021-10-15 HISTORY — DX: Bacteremia: R78.81

## 2021-10-15 LAB — CBC
HCT: 24.1 % — ABNORMAL LOW (ref 36.0–46.0)
Hemoglobin: 7.3 g/dL — ABNORMAL LOW (ref 12.0–15.0)
MCH: 25.6 pg — ABNORMAL LOW (ref 26.0–34.0)
MCHC: 30.3 g/dL (ref 30.0–36.0)
MCV: 84.6 fL (ref 80.0–100.0)
Platelets: 258 10*3/uL (ref 150–400)
RBC: 2.85 MIL/uL — ABNORMAL LOW (ref 3.87–5.11)
RDW: 15.5 % (ref 11.5–15.5)
WBC: 13.1 10*3/uL — ABNORMAL HIGH (ref 4.0–10.5)
nRBC: 0.2 % (ref 0.0–0.2)

## 2021-10-15 LAB — BASIC METABOLIC PANEL
Anion gap: 7 (ref 5–15)
BUN: 21 mg/dL — ABNORMAL HIGH (ref 6–20)
CO2: 28 mmol/L (ref 22–32)
Calcium: 8.1 mg/dL — ABNORMAL LOW (ref 8.9–10.3)
Chloride: 99 mmol/L (ref 98–111)
Creatinine, Ser: 0.91 mg/dL (ref 0.44–1.00)
GFR, Estimated: >60 mL/min (ref >60)
Glucose, Bld: 227 mg/dL — ABNORMAL HIGH (ref 70–99)
Potassium: 4.6 mmol/L (ref 3.5–5.1)
Sodium: 134 mmol/L — ABNORMAL LOW (ref 135–145)

## 2021-10-15 LAB — BRAIN NATRIURETIC PEPTIDE: B Natriuretic Peptide: 162.7 pg/mL — ABNORMAL HIGH (ref 0.0–100.0)

## 2021-10-15 LAB — CK: Total CK: 858 U/L — ABNORMAL HIGH (ref 38–234)

## 2021-10-15 LAB — PREGNANCY, URINE: Preg Test, Ur: NEGATIVE

## 2021-10-15 LAB — CBG MONITORING, ED: Glucose-Capillary: 146 mg/dL — ABNORMAL HIGH (ref 70–99)

## 2021-10-15 MED ORDER — ACETAMINOPHEN 650 MG RE SUPP
650.0000 mg | Freq: Four times a day (QID) | RECTAL | Status: DC | PRN
Start: 2021-10-15 — End: 2021-10-15

## 2021-10-15 MED ORDER — INSULIN ASPART 100 UNIT/ML IJ SOLN: 0.0000 [IU] | Freq: Every day | INTRAMUSCULAR | Status: DC

## 2021-10-15 MED ORDER — NICOTINE 21 MG/24HR TD PT24
21.0000 mg | MEDICATED_PATCH | Freq: Every day | TRANSDERMAL | Status: DC
Start: 2021-10-15 — End: 2021-10-15

## 2021-10-15 MED ORDER — VANCOMYCIN HCL IN DEXTROSE 1-5 GM/200ML-% IV SOLN
1000.0000 mg | INTRAVENOUS | Status: DC
  Administered 2021-10-15: 1000 mg via INTRAVENOUS
  Filled 2021-10-15: qty 200

## 2021-10-15 MED ORDER — ENOXAPARIN SODIUM 60 MG/0.6ML IJ SOSY: 0.5000 mg/kg | PREFILLED_SYRINGE | INTRAMUSCULAR | Status: DC

## 2021-10-15 MED ORDER — HYDRALAZINE HCL 20 MG/ML IJ SOLN: 5.0000 mg | INTRAMUSCULAR | Status: DC | PRN

## 2021-10-15 MED ORDER — INSULIN ASPART 100 UNIT/ML IJ SOLN: 0.0000 [IU] | Freq: Three times a day (TID) | INTRAMUSCULAR | Status: DC

## 2021-10-15 MED ORDER — HYDROCODONE-ACETAMINOPHEN 5-325 MG PO TABS
1.0000 | ORAL_TABLET | Freq: Once | ORAL | Status: AC
  Administered 2021-10-15: 1 via ORAL
  Filled 2021-10-15: qty 1

## 2021-10-15 MED ORDER — ONDANSETRON HCL 4 MG/2ML IJ SOLN: 4.0000 mg | Freq: Three times a day (TID) | INTRAMUSCULAR | Status: DC | PRN

## 2021-10-15 MED ORDER — ALBUTEROL SULFATE (2.5 MG/3ML) 0.083% IN NEBU: 3.0000 mL | INHALATION_SOLUTION | RESPIRATORY_TRACT | Status: DC | PRN

## 2021-10-15 MED ORDER — DM-GUAIFENESIN ER 30-600 MG PO TB12: 1.0000 | ORAL_TABLET | Freq: Two times a day (BID) | ORAL | Status: DC | PRN

## 2021-10-15 MED ORDER — VANCOMYCIN IV (FOR PTA / DISCHARGE USE ONLY): 1000.0000 mg | INTRAVENOUS | 0 refills | Status: DC

## 2021-10-15 NOTE — ED Notes (Signed)
Resting at present  awaiting for PT eval and for discharge ?

## 2021-10-15 NOTE — ED Notes (Signed)
Pt placed in recliner with much assistance  PO meds given for pain   ?
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
## 2021-10-15 NOTE — Discharge Instructions (Addendum)
[facility] has accepted referral for home health physical therapy and occupational therapy.  They will be calling to set up initial visit for Monday. ?[facility] will be doing the home infusion. ?All DME will be delivered to the home today.  ?

## 2021-10-15 NOTE — ED Notes (Signed)
Pt and husband gave verbal consent to discharge. All equipment sent home with pt.  ?

## 2021-10-15 NOTE — ED Notes (Signed)
Pt refused to be put in a brief and pants for discharge. Pt was informed it was cold outside and she needed to wear some underwear. Pt and husband still refused. This RN and Les, paramedic took pt to car and brought a brief with [place] to put on pt as she got in car. Pt still refused brief.  ?
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
## 2021-10-15 NOTE — TOC Progression Note (Signed)
Transition of Care (TOC) - Progression Note  ? ? ?Patient Details  ?Name: [name] ?MRN: [id number] ?Date of Birth: 09-11-1970 ? ?Transition of Care (TOC) CM/SW Contact  ?Shelbie Hutching, RN ?Phone Number: ?10/15/2021, 12:49 PM ? ?Clinical Narrative:    ?[facility] accepted patient for home health PT and OT, they can see patient on Monday.  [facility] will do the nursing and infusion.  All DME will be delivered to the home today, confirmed with Suanne Marker at [facility].   ?Apolonio Schneiders with [facility] home health [phone]. ? ? ?Expected Discharge Plan: [place] ?Barriers to Discharge: Other (must enter comment) (IV antibiotic teaching, equipment delivery) ? ?Expected Discharge Plan and Services ?Expected Discharge Plan: [place] ?  ?Discharge Planning Services: CM Consult ?Post Acute Care Choice: Home Health ?Living arrangements for the past 2 months: [place] ?                ?DME Arranged: Lightweight manual wheelchair with seat cushion, Other see comment, Tub bench (Slide board) ?DME Agency: [facility] ?Date DME Agency Contacted: 10/15/21 ?Time DME Agency Contacted: [phone] ?Representative spoke with at DME Agency: Suanne Marker ?HH Arranged: RN, PT, OT, IV Antibiotics ?[place] Agency: [facility] ?Date HH Agency Contacted: 10/15/21 ?Time [place]: 4128 ?Representative spoke with at [place]: Juanda Crumble ? ? ?Social Determinants of Health (SDOH) Interventions ?  ? ?Readmission Risk Interventions ?   ? View : No data to display.  ?  ?  ?  ? ? ?

## 2021-10-15 NOTE — ED Notes (Signed)
Pt states she has to use the restroom and would like to be placed on a bedpan, done at this time. ?
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
## 2021-10-15 NOTE — Progress Notes (Signed)
PHARMACIST - PHYSICIAN COMMUNICATION ? ?CONCERNING:  Enoxaparin (Lovenox) for DVT Prophylaxis  ? ? ?RECOMMENDATION: ?Patient was prescribed enoxaprin '40mg'$  q24 hours for VTE prophylaxis.  ? ?Filed Weights  ? 10/15/21 0732 10/15/21 0736  ?Weight: 103.4 kg (227 lb 15.3 oz) 98.9 kg (218 lb)  ? ? ?Body mass index is 37.42 kg/m?. ? ?Estimated Creatinine Clearance: 84.5 mL/min (by C-G formula based on SCr of 0.89 mg/dL). ? ? ?Based on [place] patient is candidate for enoxaparin 0.'5mg'$ /kg TBW SQ every 24 hours based on BMI being >30. ? ? ?DESCRIPTION: ?Pharmacy has adjusted enoxaparin dose per [facility] policy. ? ?Patient is now receiving enoxaparin 50 mg every 24 hours  ? ? ?Sherilyn Banker, PharmD ?Clinical Pharmacist  ?10/15/2021 ?9:42 AM ? ?

## 2021-10-15 NOTE — Progress Notes (Signed)
ID ?CPK higher than yesterday at 946 ?Left BKA site clean ? ?Pt will be discharged on IV vanco instead of dapto ?With monitoring of levels and creatinine-  ?Will discuss with [facility] health care NP( left a message for her ) ?OPAT Orders ?Discharge antibiotics: ?Vancomycin 1 gram IV every 24 hours at 10 am ?  ?Adjust vanco mycin according to your  pharmacy protocol  ?Aim for Vancomycin trough 15-20 (unless otherwise indicated) ?Duration: ?Total of 4 weeks ?End Date: 11/07/21 ?IF creatinoine increases inspite of vanco dose adjustment will need to change the antibiotic to daptomycin ?  ?  ?[facility] Care Per Protocol: ?  ?Labs weekly while on IV antibiotics: ?_X_ CBC with differential- Monday ?X LFT on Monday ?_X_ BMP- Monday and Thursday  ?_X_ Vancomycin trough- Monday and Thursday ?  ?_X_ Please pull PIC at completion of IV antibiotics ?  ?Fax weekly labs to [phone] ?  ?Clinic Follow Up Appt:11/02/21 at 10.30 AM ?  ?  ?Call [phone] with  any concerns or questions ? ? ? ?
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
## 2021-10-15 NOTE — Progress Notes (Cosign Needed)
?  ?  Durable Medical Equipment  ?(From admission, onward)  ?  ? ? ?  ? ?  Start     Ordered  ? 10/15/21 1014  For home use only DME Tub bench  Once       ? 10/15/21 1013  ? 10/15/21 1013  For home use only DME Other see comment  Once       ?Comments: Slide board  ?Question:  Length of Need  Answer:  Lifetime  ? 10/15/21 1013  ? 10/15/21 0957  For home use only DME lightweight manual wheelchair with seat cushion  Once       ?Comments: Patient suffers from PAD requiring left below the knee amputation which impairs their ability to perform daily activities like bathing, dressing, and grooming in the home.  A walker will not resolve  ?issue with performing activities of daily living. A wheelchair will allow patient to safely perform daily activities. Patient is not able to propel themselves in the home using a standard weight wheelchair due to endurance. Patient can self propel in the lightweight wheelchair. Length of need Lifetime. ?Accessories: elevating leg rests (ELRs), wheel locks, extensions and anti-tippers, seat cushion and back cushion.  ? 10/15/21 1013  ? ?  ?  ? ?  ?  ?
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
## 2021-10-17 DIAGNOSIS — S81802A Unspecified open wound, left lower leg, initial encounter: Secondary | ICD-10-CM | POA: Diagnosis not present

## 2021-10-18 DIAGNOSIS — S81802A Unspecified open wound, left lower leg, initial encounter: Secondary | ICD-10-CM | POA: Diagnosis not present

## 2021-10-19 DIAGNOSIS — S81802A Unspecified open wound, left lower leg, initial encounter: Secondary | ICD-10-CM | POA: Diagnosis not present

## 2021-10-19 DIAGNOSIS — B9562 Methicillin resistant Staphylococcus aureus infection as the cause of diseases classified elsewhere: Secondary | ICD-10-CM | POA: Diagnosis not present

## 2021-10-19 DIAGNOSIS — R7881 Bacteremia: Secondary | ICD-10-CM | POA: Diagnosis not present

## 2021-10-20 ENCOUNTER — Telehealth: Payer: Self-pay | Admitting: Primary Care

## 2021-10-20 DIAGNOSIS — F419 Anxiety disorder, unspecified: Secondary | ICD-10-CM | POA: Diagnosis not present

## 2021-10-20 DIAGNOSIS — E1169 Type 2 diabetes mellitus with other specified complication: Secondary | ICD-10-CM | POA: Diagnosis not present

## 2021-10-20 DIAGNOSIS — I4510 Unspecified right bundle-branch block: Secondary | ICD-10-CM | POA: Diagnosis not present

## 2021-10-20 DIAGNOSIS — I872 Venous insufficiency (chronic) (peripheral): Secondary | ICD-10-CM | POA: Diagnosis not present

## 2021-10-20 DIAGNOSIS — K219 Gastro-esophageal reflux disease without esophagitis: Secondary | ICD-10-CM | POA: Diagnosis not present

## 2021-10-20 DIAGNOSIS — I5032 Chronic diastolic (congestive) heart failure: Secondary | ICD-10-CM | POA: Diagnosis not present

## 2021-10-20 DIAGNOSIS — Z8673 Personal history of transient ischemic attack (TIA), and cerebral infarction without residual deficits: Secondary | ICD-10-CM | POA: Diagnosis not present

## 2021-10-20 DIAGNOSIS — J42 Unspecified chronic bronchitis: Secondary | ICD-10-CM | POA: Diagnosis not present

## 2021-10-20 DIAGNOSIS — S81802A Unspecified open wound, left lower leg, initial encounter: Secondary | ICD-10-CM | POA: Diagnosis not present

## 2021-10-20 DIAGNOSIS — I130 Hypertensive heart and chronic kidney disease with heart failure and stage 1 through stage 4 chronic kidney disease, or unspecified chronic kidney disease: Secondary | ICD-10-CM | POA: Diagnosis not present

## 2021-10-20 DIAGNOSIS — E782 Mixed hyperlipidemia: Secondary | ICD-10-CM | POA: Diagnosis not present

## 2021-10-20 DIAGNOSIS — Z87891 Personal history of nicotine dependence: Secondary | ICD-10-CM | POA: Diagnosis not present

## 2021-10-20 DIAGNOSIS — Z4781 Encounter for orthopedic aftercare following surgical amputation: Secondary | ICD-10-CM | POA: Diagnosis not present

## 2021-10-20 DIAGNOSIS — E1151 Type 2 diabetes mellitus with diabetic peripheral angiopathy without gangrene: Secondary | ICD-10-CM | POA: Diagnosis not present

## 2021-10-20 IMAGING — CR DG CHEST 2V
1 series · 2 of 2 positions shown · non-contrast
Comparison: 11/05/2020.  CT 07/22/2020.

CLINICAL DATA: Shortness of breath

EXAM:
CHEST - 2 VIEW

[Series 1: dg chest 2 view · 0.14mm/px · 2 of 2 slices shown]
[im 1/2]
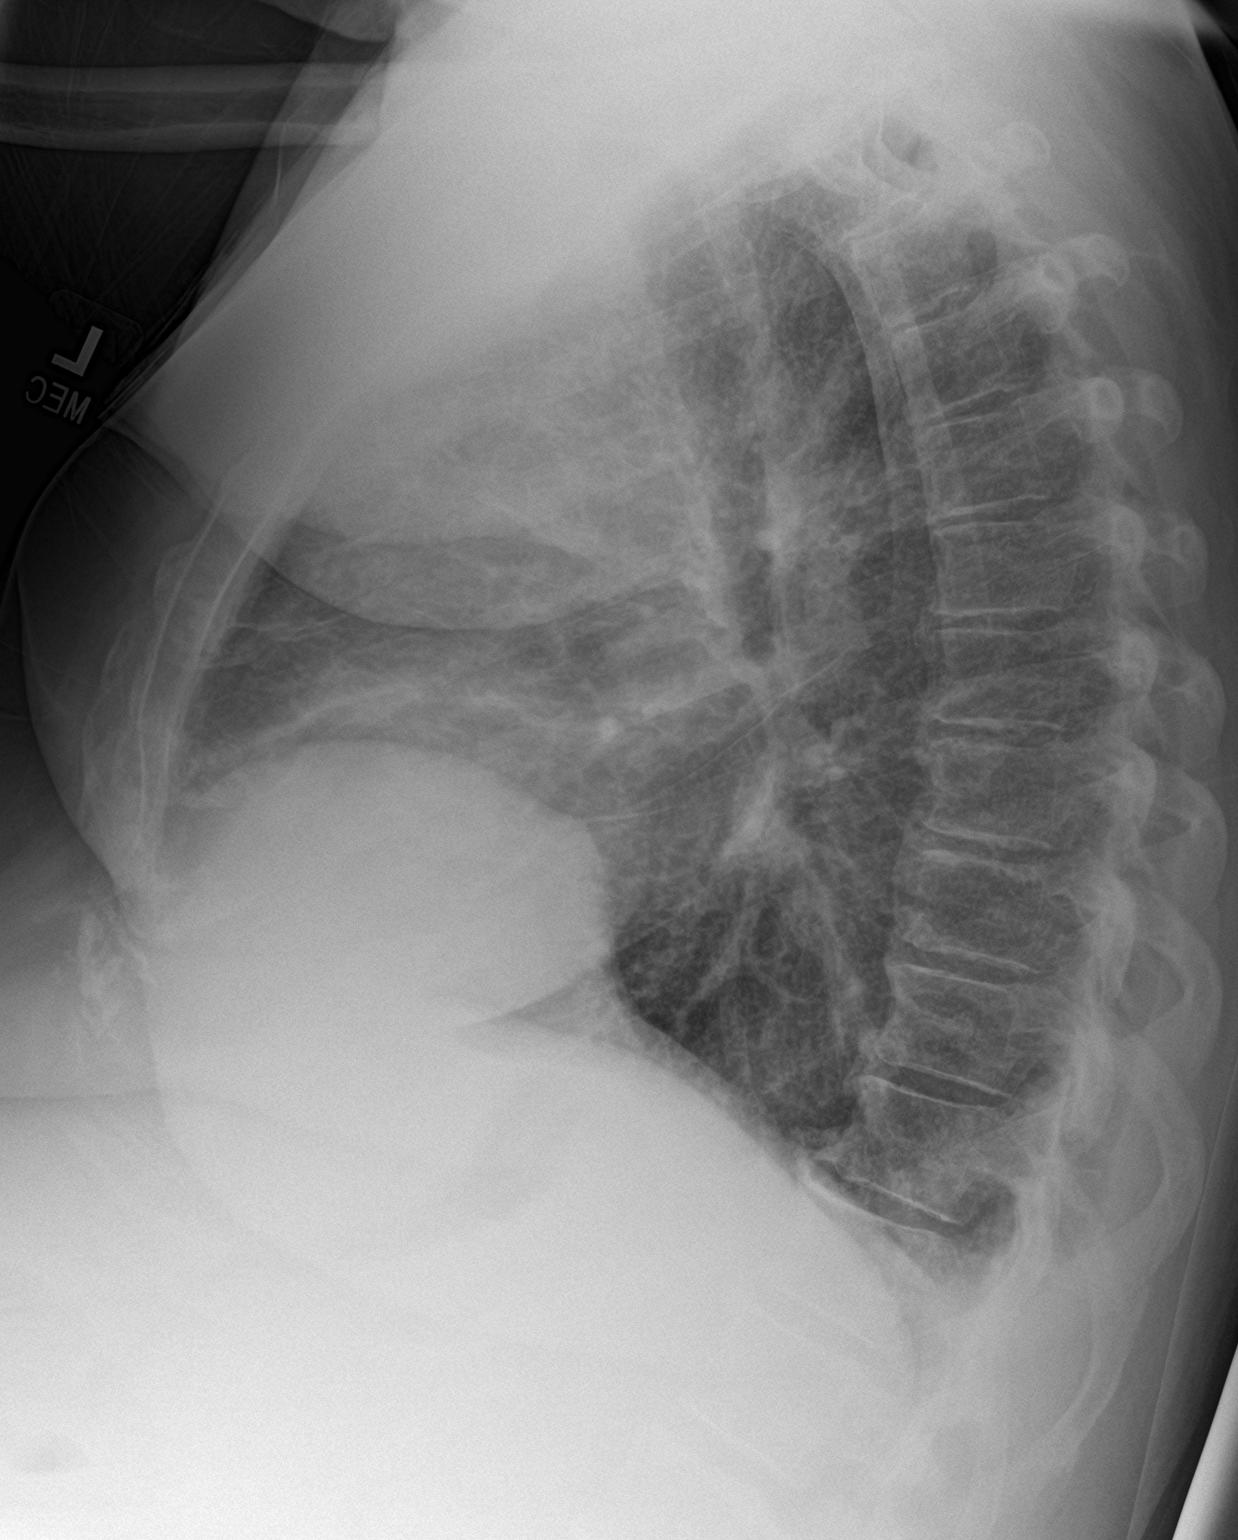
[im 2/2]
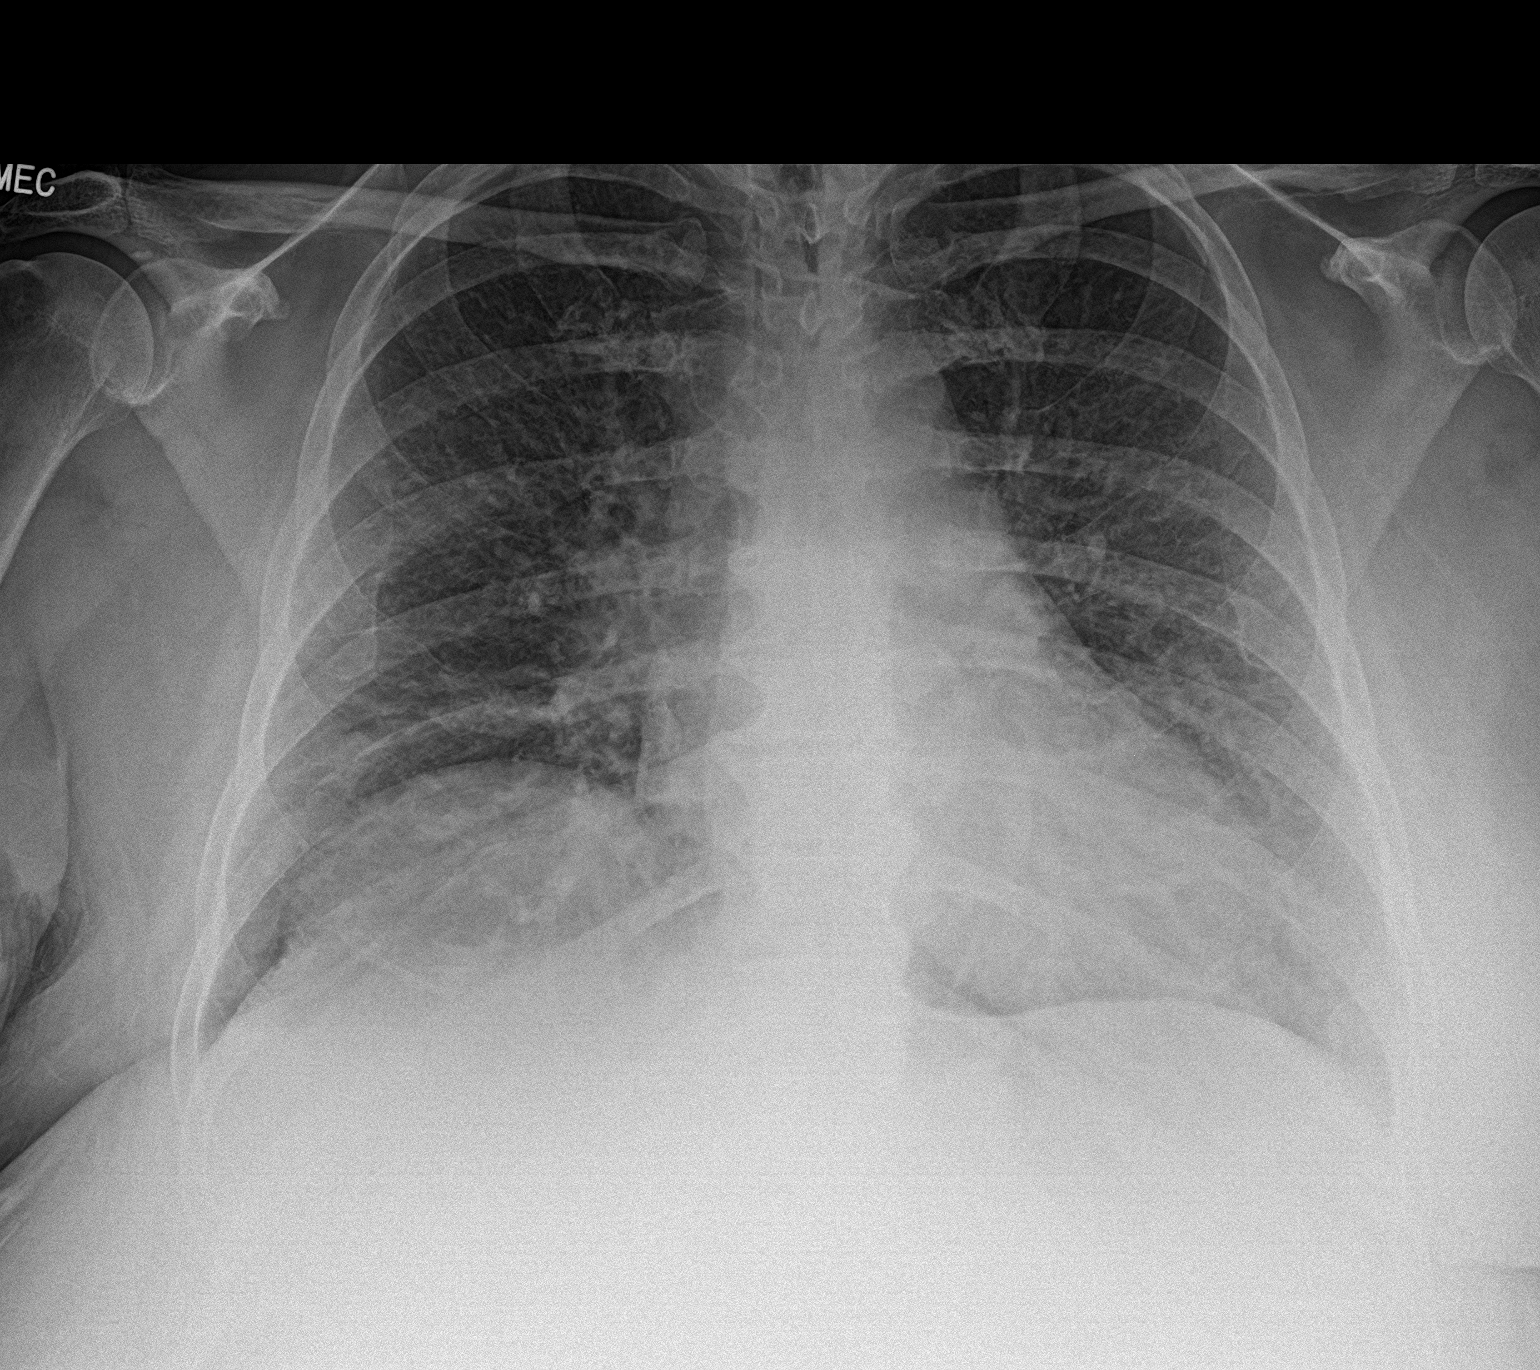

[2 of 2 positions shown; findings below may reference images not displayed]

FINDINGS: Lateral view degraded by patient arm position. Midline trachea.
Borderline cardiomegaly. No pleural effusion or pneumothorax. No
lobar consolidation. Fine interstitial thickening is chronic over
multiple prior exams.
IMPRESSION: No acute process or interval change.

Borderline cardiomegaly with chronic fine interstitial thickening.
Although possibly related to interstitial edema, chronicity favors
mild interstitial lung disease. If the patient has chronic shortness
of breath, consider pulmonology outpatient referral for possible
high-resolution chest CT.

## 2021-10-20 NOTE — Telephone Encounter (Signed)
Pt called stating that she needs a Hospital Bed and Lift and a Lawyer. Please advise. ?

## 2021-10-20 NOTE — Telephone Encounter (Signed)
Will you ask for some clarification? ? ?Are we sending in a prescription for a hospital bed, lift, mobile scooter? ?

## 2021-10-20 NOTE — Telephone Encounter (Signed)
Pt called stating that [facility] in [place] has all of the equipment she needs. Pt states that you can fax the order to [phone]. Please advise.  ?

## 2021-10-21 DIAGNOSIS — R7881 Bacteremia: Secondary | ICD-10-CM | POA: Diagnosis not present

## 2021-10-21 DIAGNOSIS — S81802A Unspecified open wound, left lower leg, initial encounter: Secondary | ICD-10-CM | POA: Diagnosis not present

## 2021-10-21 DIAGNOSIS — B9562 Methicillin resistant Staphylococcus aureus infection as the cause of diseases classified elsewhere: Secondary | ICD-10-CM | POA: Diagnosis not present

## 2021-10-22 ENCOUNTER — Other Ambulatory Visit: Payer: Self-pay | Admitting: Primary Care

## 2021-10-22 DIAGNOSIS — E1042 Type 1 diabetes mellitus with diabetic polyneuropathy: Secondary | ICD-10-CM

## 2021-10-22 DIAGNOSIS — S81802A Unspecified open wound, left lower leg, initial encounter: Secondary | ICD-10-CM | POA: Diagnosis not present

## 2021-10-22 DIAGNOSIS — M792 Neuralgia and neuritis, unspecified: Secondary | ICD-10-CM

## 2021-10-22 DIAGNOSIS — G629 Polyneuropathy, unspecified: Secondary | ICD-10-CM

## 2021-10-22 NOTE — Telephone Encounter (Signed)
Pt called checking on status of her equipment. Pt states that it is a prescription. Please advise. ?

## 2021-10-22 NOTE — Telephone Encounter (Signed)
Noted. ?Prescription placed in [name]'s inbox. ?

## 2021-10-23 DIAGNOSIS — S81802A Unspecified open wound, left lower leg, initial encounter: Secondary | ICD-10-CM | POA: Diagnosis not present

## 2021-10-24 DIAGNOSIS — S81802A Unspecified open wound, left lower leg, initial encounter: Secondary | ICD-10-CM | POA: Diagnosis not present

## 2021-10-25 ENCOUNTER — Telehealth: Payer: Self-pay | Admitting: Primary Care

## 2021-10-25 DIAGNOSIS — S81802A Unspecified open wound, left lower leg, initial encounter: Secondary | ICD-10-CM | POA: Diagnosis not present

## 2021-10-25 DIAGNOSIS — B9562 Methicillin resistant Staphylococcus aureus infection as the cause of diseases classified elsewhere: Secondary | ICD-10-CM | POA: Diagnosis not present

## 2021-10-25 DIAGNOSIS — R7881 Bacteremia: Secondary | ICD-10-CM | POA: Diagnosis not present

## 2021-10-25 NOTE — Telephone Encounter (Signed)
Pt called stating that that the orders that was placed was wrong. Pt states that she just need a hospital bed lift and mobile scooter. Pt states that she doesn't need the rest of the stuff that was ordered. Please advise.  ?

## 2021-10-25 NOTE — Telephone Encounter (Signed)
I'm not sure what she's referring to. ?My Rx simply states those three things she requested. ? ?Perhaps she's talking about someone else? ?

## 2021-10-25 NOTE — Telephone Encounter (Signed)
Called patient to see if she wanted to come pick up. States [facility]. Can we order in epic?  ?

## 2021-10-25 NOTE — Telephone Encounter (Signed)
Pt called stating that she would like an order for a nurse for OT and PT to come out. Please advise. ?

## 2021-10-25 NOTE — Telephone Encounter (Signed)
It looks like she had surgery this month.  ?According to ED notes from 10/15/21, she was already set up for PT and OT. See TOC Progression Note from Ms. Creech on 10/15/21. ?

## 2021-10-26 ENCOUNTER — Telehealth (INDEPENDENT_AMBULATORY_CARE_PROVIDER_SITE_OTHER): Payer: Self-pay

## 2021-10-26 ENCOUNTER — Telehealth: Payer: Self-pay | Admitting: Primary Care

## 2021-10-26 DIAGNOSIS — I5032 Chronic diastolic (congestive) heart failure: Secondary | ICD-10-CM | POA: Diagnosis not present

## 2021-10-26 DIAGNOSIS — K219 Gastro-esophageal reflux disease without esophagitis: Secondary | ICD-10-CM | POA: Diagnosis not present

## 2021-10-26 DIAGNOSIS — E1151 Type 2 diabetes mellitus with diabetic peripheral angiopathy without gangrene: Secondary | ICD-10-CM | POA: Diagnosis not present

## 2021-10-26 DIAGNOSIS — S81802A Unspecified open wound, left lower leg, initial encounter: Secondary | ICD-10-CM | POA: Diagnosis not present

## 2021-10-26 DIAGNOSIS — Z87891 Personal history of nicotine dependence: Secondary | ICD-10-CM | POA: Diagnosis not present

## 2021-10-26 DIAGNOSIS — F419 Anxiety disorder, unspecified: Secondary | ICD-10-CM | POA: Diagnosis not present

## 2021-10-26 DIAGNOSIS — I4510 Unspecified right bundle-branch block: Secondary | ICD-10-CM | POA: Diagnosis not present

## 2021-10-26 DIAGNOSIS — I130 Hypertensive heart and chronic kidney disease with heart failure and stage 1 through stage 4 chronic kidney disease, or unspecified chronic kidney disease: Secondary | ICD-10-CM | POA: Diagnosis not present

## 2021-10-26 DIAGNOSIS — Z8673 Personal history of transient ischemic attack (TIA), and cerebral infarction without residual deficits: Secondary | ICD-10-CM | POA: Diagnosis not present

## 2021-10-26 DIAGNOSIS — Z4781 Encounter for orthopedic aftercare following surgical amputation: Secondary | ICD-10-CM | POA: Diagnosis not present

## 2021-10-26 DIAGNOSIS — I872 Venous insufficiency (chronic) (peripheral): Secondary | ICD-10-CM | POA: Diagnosis not present

## 2021-10-26 DIAGNOSIS — E1169 Type 2 diabetes mellitus with other specified complication: Secondary | ICD-10-CM | POA: Diagnosis not present

## 2021-10-26 DIAGNOSIS — J42 Unspecified chronic bronchitis: Secondary | ICD-10-CM | POA: Diagnosis not present

## 2021-10-26 DIAGNOSIS — E782 Mixed hyperlipidemia: Secondary | ICD-10-CM | POA: Diagnosis not present

## 2021-10-26 NOTE — Telephone Encounter (Signed)
Home Health verbal orders ?Caller Name:[facility] ?Agency Name: [facility] ? ?Callback number: [phone] ? ?Requesting OT/PT/Skilled nursing/Social Work/Speech: ? ?Reason:OT  ? ?Frequency:1 wk for 8 wks starting on 10/26/21         Skilled Nursing for wound care 1 wk for 1 wk starting on 11/01/21 ? ?Please forward to [facility] pool or providers CMA  ?

## 2021-10-26 NOTE — Telephone Encounter (Signed)
While on phone with patient therapist was there they would like to hold off on [place] placing any orders. PT will be there to evaluate her today and will send order request for specific things that she needs.  ?

## 2021-10-26 NOTE — Telephone Encounter (Signed)
Patients husband called in wanting to make appt for wife staple removal. Was making the appt for 11/04/2021. Patients husband let me know he has no way of transporting patient to the office and is in the process of setting up in home physical therapy  but is wanting to know if we can help with possible in home care to get the staples removed, because he is not able to get her to the office.  ? ? ?Please call and advise  ?

## 2021-10-26 NOTE — Telephone Encounter (Signed)
Spoke to patient she thinks that that order was sent by another office. We are still working on getting other orders for things that we did request placed now.  ?

## 2021-10-26 NOTE — Telephone Encounter (Signed)
Spoke to patient states that she has OT and PT out there now.  ?

## 2021-10-26 NOTE — Telephone Encounter (Signed)
While talking to patient she has new bed sore that she needs nursing for. Will you need to see in office to address?  ?

## 2021-10-26 NOTE — Telephone Encounter (Signed)
It looks like her PCP is trying to get her [name] as well.  Once they establish that we can see if [facility] is willing to do staple removal with orders

## 2021-10-26 NOTE — Telephone Encounter (Signed)
Noted  

## 2021-10-26 NOTE — Telephone Encounter (Signed)
Return called to Mr Cassetta but was not able to leave a voicemail  ?

## 2021-10-26 NOTE — Telephone Encounter (Signed)
Can we give a verbal order to her home health agency for nursing care? ?

## 2021-10-27 ENCOUNTER — Other Ambulatory Visit (INDEPENDENT_AMBULATORY_CARE_PROVIDER_SITE_OTHER): Payer: Self-pay | Admitting: Nurse Practitioner

## 2021-10-27 ENCOUNTER — Telehealth (INDEPENDENT_AMBULATORY_CARE_PROVIDER_SITE_OTHER): Payer: Self-pay

## 2021-10-27 DIAGNOSIS — S81802A Unspecified open wound, left lower leg, initial encounter: Secondary | ICD-10-CM | POA: Diagnosis not present

## 2021-10-27 MED ORDER — OXYCODONE HCL 5 MG PO TABS: 5.0000 mg | ORAL_TABLET | Freq: Three times a day (TID) | ORAL | 0 refills | Status: DC | PRN

## 2021-10-27 NOTE — Telephone Encounter (Signed)
[name] was made aware with medical recommendations and verbalized understanding  ?

## 2021-10-27 NOTE — Telephone Encounter (Signed)
Per previous message [name] has given verbal orders for this. I have called [facility] and given information. Will call if any further questions.  ?

## 2021-10-27 NOTE — Telephone Encounter (Signed)
Called and verbal given for nursing care. Documented in message where they called to request verbal for nursing evaluation.  ?

## 2021-10-27 NOTE — Telephone Encounter (Signed)
We can send her pain medication but in reviewing the records, she was given Oxycodone 5/325 repeatedly in the hospital.  The 5 and 10 mg dose are also oxycodone they just don't have tylenol attached.  Is she referring to hydrocodone

## 2021-10-27 NOTE — Telephone Encounter (Signed)
Patient called stating she would like pain medication called in because she is in pain. Patient stated she was given 5 and 10 mg pain medication in the hospital and the 5/325 Oxycodone she is allergic to. Patient had a left BKA on 10/14/21 with Dr. Lucky Cowboy. Please advise. Thanks  ?

## 2021-10-27 NOTE — Telephone Encounter (Signed)
Contacted the patient and per the spouse the patient was sleep. Per Eulogio Ditch NP she will send in '5mg'$  Oxycodone to the patient's pharmacy and the spouse was informed to call us if patient has trouble with the pain medication. ?

## 2021-10-28 ENCOUNTER — Telehealth: Payer: Self-pay | Admitting: Primary Care

## 2021-10-28 DIAGNOSIS — E782 Mixed hyperlipidemia: Secondary | ICD-10-CM | POA: Diagnosis not present

## 2021-10-28 DIAGNOSIS — I130 Hypertensive heart and chronic kidney disease with heart failure and stage 1 through stage 4 chronic kidney disease, or unspecified chronic kidney disease: Secondary | ICD-10-CM | POA: Diagnosis not present

## 2021-10-28 DIAGNOSIS — E1169 Type 2 diabetes mellitus with other specified complication: Secondary | ICD-10-CM | POA: Diagnosis not present

## 2021-10-28 DIAGNOSIS — E1151 Type 2 diabetes mellitus with diabetic peripheral angiopathy without gangrene: Secondary | ICD-10-CM | POA: Diagnosis not present

## 2021-10-28 DIAGNOSIS — J42 Unspecified chronic bronchitis: Secondary | ICD-10-CM | POA: Diagnosis not present

## 2021-10-28 DIAGNOSIS — B9562 Methicillin resistant Staphylococcus aureus infection as the cause of diseases classified elsewhere: Secondary | ICD-10-CM | POA: Diagnosis not present

## 2021-10-28 DIAGNOSIS — S81802A Unspecified open wound, left lower leg, initial encounter: Secondary | ICD-10-CM | POA: Diagnosis not present

## 2021-10-28 DIAGNOSIS — Z4781 Encounter for orthopedic aftercare following surgical amputation: Secondary | ICD-10-CM | POA: Diagnosis not present

## 2021-10-28 DIAGNOSIS — Z87891 Personal history of nicotine dependence: Secondary | ICD-10-CM | POA: Diagnosis not present

## 2021-10-28 DIAGNOSIS — I872 Venous insufficiency (chronic) (peripheral): Secondary | ICD-10-CM | POA: Diagnosis not present

## 2021-10-28 DIAGNOSIS — I5032 Chronic diastolic (congestive) heart failure: Secondary | ICD-10-CM | POA: Diagnosis not present

## 2021-10-28 DIAGNOSIS — R7881 Bacteremia: Secondary | ICD-10-CM | POA: Diagnosis not present

## 2021-10-28 DIAGNOSIS — Z8673 Personal history of transient ischemic attack (TIA), and cerebral infarction without residual deficits: Secondary | ICD-10-CM | POA: Diagnosis not present

## 2021-10-28 DIAGNOSIS — F419 Anxiety disorder, unspecified: Secondary | ICD-10-CM | POA: Diagnosis not present

## 2021-10-28 DIAGNOSIS — K219 Gastro-esophageal reflux disease without esophagitis: Secondary | ICD-10-CM | POA: Diagnosis not present

## 2021-10-28 DIAGNOSIS — I4510 Unspecified right bundle-branch block: Secondary | ICD-10-CM | POA: Diagnosis not present

## 2021-10-28 NOTE — Telephone Encounter (Signed)
[name] called stating information below: ? ?"Pt needs an order for a nurse to address her wound". ? ?Did not leave a callback number ?

## 2021-10-29 ENCOUNTER — Telehealth: Payer: Self-pay

## 2021-10-29 DIAGNOSIS — S81802A Unspecified open wound, left lower leg, initial encounter: Secondary | ICD-10-CM | POA: Diagnosis not present

## 2021-10-29 NOTE — Telephone Encounter (Signed)
Called and lvm given verbal okay for OT for patient on [facility] vm . ?

## 2021-10-29 NOTE — Telephone Encounter (Signed)
-----   Message from Tsosie Billing, MD sent at 10/29/2021  1:35 PM EDT ----- ?Regarding: Critical value ?I got a critical value of 6.4 sent to my email by [facility] [name]. She has baseline hb 7.4z she is Recent BKA and on vancomycin. I don?t have any other labs and we need to get the cr and vanco level. Can you please call her to check how she is doing ? If she is symptomatic with dizziness, palpitations shortness of breath she would need to go to the ED. Thanks ?

## 2021-10-29 NOTE — Telephone Encounter (Signed)
I spoke to the patient to follow up with her regarding hemoglobin of 6.9.  Patient states she is feeling ok. Patient denies any dizziness, palpitations, sob, weakness. Patient advised if she experiences any of the symptoms to go to the ED. Patient's husband advised as well and will follow up with pcp regarding low hemoglobin Patient confirmed appointment for next week as well. I spoke with [name] with [facility] as well asked that all labs be repeated for the patient. Per [name] they will go back out on Monday.  ?[name] T Melissia Lahman ? ?

## 2021-10-29 NOTE — Telephone Encounter (Signed)
Pt's spouse called and asked about the status of the verbal order below: Wanted to speak with nurse to see what the status is.  ? ?Home Health verbal orders ?Caller Name:[facility] ?Agency Name: [facility] ?  ?Callback number: [phone] ?  ?Requesting OT/PT/Skilled nursing/Social Work/Speech: ?  ?Reason:OT  ?  ?Frequency:1 wk for 8 wks starting on 10/26/21 Skilled Nursing for wound care 1 wk for 1 wk starting on 11/01/21 ?  ?Please forward to [facility] pool or providers CMA  ?

## 2021-10-30 DIAGNOSIS — S81802A Unspecified open wound, left lower leg, initial encounter: Secondary | ICD-10-CM | POA: Diagnosis not present

## 2021-10-31 DIAGNOSIS — S81802A Unspecified open wound, left lower leg, initial encounter: Secondary | ICD-10-CM | POA: Diagnosis not present

## 2021-11-01 ENCOUNTER — Telehealth: Payer: Self-pay | Admitting: Primary Care

## 2021-11-01 DIAGNOSIS — B9562 Methicillin resistant Staphylococcus aureus infection as the cause of diseases classified elsewhere: Secondary | ICD-10-CM | POA: Diagnosis not present

## 2021-11-01 DIAGNOSIS — R7889 Finding of other specified substances, not normally found in blood: Secondary | ICD-10-CM | POA: Diagnosis not present

## 2021-11-01 DIAGNOSIS — S81802A Unspecified open wound, left lower leg, initial encounter: Secondary | ICD-10-CM | POA: Diagnosis not present

## 2021-11-01 DIAGNOSIS — R7881 Bacteremia: Secondary | ICD-10-CM | POA: Diagnosis not present

## 2021-11-01 NOTE — Telephone Encounter (Signed)
OK for [facility] nurse to address wound ?

## 2021-11-01 NOTE — Telephone Encounter (Signed)
Called [facility], no orders received. Called spoke to [place] per our last conversation he was going to evaluated and send orders for [place] to fax. He states his office sent over one day late last week. Did not see orders he will have resent to day. Will keep eye out for order.  ?

## 2021-11-01 NOTE — Telephone Encounter (Signed)
Pt's spouse called asking about their order for the hospital bed/lift. He stated he called [facility] about 20 minutes ago and they have not received in orders from anyone. Would like to speak to the nurse. [facility]: [phone]  ?Name: Elias Else: [phone] ? ?Callback Number: [phone] (patient) or [phone] (spouse) ?

## 2021-11-01 NOTE — Telephone Encounter (Signed)
Called and spoke to PT they have received order for nursing  ?

## 2021-11-02 ENCOUNTER — Ambulatory Visit: Payer: BC Managed Care – PPO | Attending: Infectious Diseases | Admitting: Infectious Diseases

## 2021-11-02 DIAGNOSIS — J42 Unspecified chronic bronchitis: Secondary | ICD-10-CM | POA: Diagnosis not present

## 2021-11-02 DIAGNOSIS — B9562 Methicillin resistant Staphylococcus aureus infection as the cause of diseases classified elsewhere: Secondary | ICD-10-CM

## 2021-11-02 DIAGNOSIS — Z87891 Personal history of nicotine dependence: Secondary | ICD-10-CM | POA: Diagnosis not present

## 2021-11-02 DIAGNOSIS — E782 Mixed hyperlipidemia: Secondary | ICD-10-CM | POA: Diagnosis not present

## 2021-11-02 DIAGNOSIS — E1151 Type 2 diabetes mellitus with diabetic peripheral angiopathy without gangrene: Secondary | ICD-10-CM | POA: Diagnosis not present

## 2021-11-02 DIAGNOSIS — I130 Hypertensive heart and chronic kidney disease with heart failure and stage 1 through stage 4 chronic kidney disease, or unspecified chronic kidney disease: Secondary | ICD-10-CM | POA: Diagnosis not present

## 2021-11-02 DIAGNOSIS — R7881 Bacteremia: Secondary | ICD-10-CM

## 2021-11-02 DIAGNOSIS — S81802A Unspecified open wound, left lower leg, initial encounter: Secondary | ICD-10-CM | POA: Diagnosis not present

## 2021-11-02 DIAGNOSIS — E1169 Type 2 diabetes mellitus with other specified complication: Secondary | ICD-10-CM | POA: Diagnosis not present

## 2021-11-02 DIAGNOSIS — I4510 Unspecified right bundle-branch block: Secondary | ICD-10-CM | POA: Diagnosis not present

## 2021-11-02 DIAGNOSIS — I872 Venous insufficiency (chronic) (peripheral): Secondary | ICD-10-CM | POA: Diagnosis not present

## 2021-11-02 DIAGNOSIS — Z8673 Personal history of transient ischemic attack (TIA), and cerebral infarction without residual deficits: Secondary | ICD-10-CM | POA: Diagnosis not present

## 2021-11-02 DIAGNOSIS — Z4781 Encounter for orthopedic aftercare following surgical amputation: Secondary | ICD-10-CM | POA: Diagnosis not present

## 2021-11-02 DIAGNOSIS — K219 Gastro-esophageal reflux disease without esophagitis: Secondary | ICD-10-CM | POA: Diagnosis not present

## 2021-11-02 DIAGNOSIS — F419 Anxiety disorder, unspecified: Secondary | ICD-10-CM | POA: Diagnosis not present

## 2021-11-02 DIAGNOSIS — I5032 Chronic diastolic (congestive) heart failure: Secondary | ICD-10-CM | POA: Diagnosis not present

## 2021-11-02 NOTE — Progress Notes (Signed)
Visit could not be done as her video  was not working initially and then patient went to sleep and son did not want to disturb her, ?'rescheduled for Thursday ?

## 2021-11-02 NOTE — Telephone Encounter (Signed)
[name], from [facility], states they received the order for the hospital bed/lift but the order has narrative information on it and [name] states this has to be separated. Can not have additional information on the order. Her cb [phone] if any questions, fax [phone] ?

## 2021-11-03 ENCOUNTER — Telehealth (INDEPENDENT_AMBULATORY_CARE_PROVIDER_SITE_OTHER): Payer: Self-pay | Admitting: Nurse Practitioner

## 2021-11-03 ENCOUNTER — Telehealth: Payer: Self-pay

## 2021-11-03 DIAGNOSIS — S81802A Unspecified open wound, left lower leg, initial encounter: Secondary | ICD-10-CM | POA: Diagnosis not present

## 2021-11-03 NOTE — Telephone Encounter (Signed)
Called and spoke w/ [facility] order they are fax order to [place] to have one of the provider her to correct this order and I also Have spoken with patient 's husband to make him aware of this. I told once this has been corrected an refax I would give him a call back to let him know that has been  handle. ?

## 2021-11-03 NOTE — Telephone Encounter (Signed)
Called and informed patient that  rx for hospital and left has been corrected and refaxed to [facility]. ?

## 2021-11-03 NOTE — Telephone Encounter (Signed)
Called and spoke [facility] regarding order for hospital bed and lift, there is a correct that needed on the dx line,once this is corrected they are able to  delivery out the  supplies to the patient.  They are going to fax the order to [place] and to have one the other provider her to correct. Called and spoke w/ pt husband and explain to him that  the order was sent to [facility] and once we have correct the order I will call him to let know that we have fax this back to them . He as if he will be receive the bed today I told that would up to [facility] , I stressed to him I will handle this as quickly as possible. ?

## 2021-11-03 NOTE — Telephone Encounter (Signed)
Pt's husband called back saying [facility] told him they have not received the fax with the corrected narrative. I advised him that Louretta Shorten did that at 1215 today. He needs to contact them back and advise the corrected form was faxed at 1215 today. ?

## 2021-11-03 NOTE — Telephone Encounter (Signed)
Spoke with pt and she states that [facility] comes out 2times a week could she take out the staples.  After speaking to Eulogio Ditch NP she gave the ok to give verbal orders for every other staple to be removed then a week later remove the rest of the staples.  I called stephanie and got VM left a message for her to call back. ?

## 2021-11-03 NOTE — Telephone Encounter (Signed)
Pt and pts husband have called a few times the last few days... wondering what is going on with this order for the hospital bed/lift. They are very upset this is not getting done.  ? ? ?

## 2021-11-03 NOTE — Telephone Encounter (Signed)
Pt's spouse called again today about the [place] paperwork for his wife's bed and lift. I routed the call to [place]. He told us the paperwork needed to be on 2 separate papers for it to work.  ?

## 2021-11-03 NOTE — Telephone Encounter (Signed)
Pt. Called and wanted to know if someone could come to her house to remove the staples because she is unable to come here.  She states some of the staples are coming out as well ?

## 2021-11-04 ENCOUNTER — Emergency Department: Payer: BC Managed Care – PPO

## 2021-11-04 ENCOUNTER — Ambulatory Visit: Payer: BC Managed Care – PPO | Attending: Infectious Diseases | Admitting: Infectious Diseases

## 2021-11-04 ENCOUNTER — Inpatient Hospital Stay
Admission: EM | Admit: 2021-11-04 | Discharge: 2021-11-20 | DRG: 464 | Disposition: A | Discharge status: Home Health | Payer: BC Managed Care – PPO | Attending: Internal Medicine | Admitting: Internal Medicine

## 2021-11-04 ENCOUNTER — Ambulatory Visit (INDEPENDENT_AMBULATORY_CARE_PROVIDER_SITE_OTHER): Payer: Medicare Other | Admitting: Nurse Practitioner

## 2021-11-04 ENCOUNTER — Telehealth (INDEPENDENT_AMBULATORY_CARE_PROVIDER_SITE_OTHER): Payer: Self-pay

## 2021-11-04 ENCOUNTER — Observation Stay: Payer: BC Managed Care – PPO

## 2021-11-04 ENCOUNTER — Other Ambulatory Visit: Payer: Self-pay

## 2021-11-04 ENCOUNTER — Encounter: Payer: Self-pay | Admitting: Infectious Diseases

## 2021-11-04 ENCOUNTER — Encounter: Payer: Self-pay | Admitting: Emergency Medicine

## 2021-11-04 DIAGNOSIS — T8754 Necrosis of amputation stump, left lower extremity: Secondary | ICD-10-CM | POA: Diagnosis not present

## 2021-11-04 DIAGNOSIS — I130 Hypertensive heart and chronic kidney disease with heart failure and stage 1 through stage 4 chronic kidney disease, or unspecified chronic kidney disease: Secondary | ICD-10-CM | POA: Diagnosis present

## 2021-11-04 DIAGNOSIS — Z87891 Personal history of nicotine dependence: Secondary | ICD-10-CM | POA: Diagnosis not present

## 2021-11-04 DIAGNOSIS — T8744 Infection of amputation stump, left lower extremity: Secondary | ICD-10-CM | POA: Diagnosis not present

## 2021-11-04 DIAGNOSIS — M009 Pyogenic arthritis, unspecified: Secondary | ICD-10-CM | POA: Diagnosis not present

## 2021-11-04 DIAGNOSIS — Z4781 Encounter for orthopedic aftercare following surgical amputation: Secondary | ICD-10-CM | POA: Diagnosis not present

## 2021-11-04 DIAGNOSIS — Z89512 Acquired absence of left leg below knee: Secondary | ICD-10-CM | POA: Diagnosis not present

## 2021-11-04 DIAGNOSIS — T8742 Infection of amputation stump, left upper extremity: Secondary | ICD-10-CM | POA: Diagnosis not present

## 2021-11-04 DIAGNOSIS — E1129 Type 2 diabetes mellitus with other diabetic kidney complication: Secondary | ICD-10-CM | POA: Diagnosis not present

## 2021-11-04 DIAGNOSIS — E1152 Type 2 diabetes mellitus with diabetic peripheral angiopathy with gangrene: Secondary | ICD-10-CM | POA: Diagnosis not present

## 2021-11-04 DIAGNOSIS — F17210 Nicotine dependence, cigarettes, uncomplicated: Secondary | ICD-10-CM | POA: Diagnosis present

## 2021-11-04 DIAGNOSIS — B9562 Methicillin resistant Staphylococcus aureus infection as the cause of diseases classified elsewhere: Secondary | ICD-10-CM | POA: Insufficient documentation

## 2021-11-04 DIAGNOSIS — Z452 Encounter for adjustment and management of vascular access device: Secondary | ICD-10-CM | POA: Diagnosis not present

## 2021-11-04 DIAGNOSIS — T148XXA Other injury of unspecified body region, initial encounter: Secondary | ICD-10-CM | POA: Diagnosis not present

## 2021-11-04 DIAGNOSIS — L03115 Cellulitis of right lower limb: Secondary | ICD-10-CM | POA: Diagnosis not present

## 2021-11-04 DIAGNOSIS — L03116 Cellulitis of left lower limb: Secondary | ICD-10-CM | POA: Diagnosis not present

## 2021-11-04 DIAGNOSIS — D62 Acute posthemorrhagic anemia: Secondary | ICD-10-CM | POA: Diagnosis present

## 2021-11-04 DIAGNOSIS — I70261 Atherosclerosis of native arteries of extremities with gangrene, right leg: Secondary | ICD-10-CM | POA: Diagnosis not present

## 2021-11-04 DIAGNOSIS — L089 Local infection of the skin and subcutaneous tissue, unspecified: Secondary | ICD-10-CM | POA: Diagnosis not present

## 2021-11-04 DIAGNOSIS — Z803 Family history of malignant neoplasm of breast: Secondary | ICD-10-CM

## 2021-11-04 DIAGNOSIS — R0902 Hypoxemia: Secondary | ICD-10-CM | POA: Diagnosis not present

## 2021-11-04 DIAGNOSIS — I5032 Chronic diastolic (congestive) heart failure: Secondary | ICD-10-CM | POA: Diagnosis not present

## 2021-11-04 DIAGNOSIS — I700 Atherosclerosis of aorta: Secondary | ICD-10-CM | POA: Diagnosis not present

## 2021-11-04 DIAGNOSIS — I96 Gangrene, not elsewhere classified: Secondary | ICD-10-CM | POA: Diagnosis not present

## 2021-11-04 DIAGNOSIS — I38 Endocarditis, valve unspecified: Secondary | ICD-10-CM | POA: Diagnosis not present

## 2021-11-04 DIAGNOSIS — Z9011 Acquired absence of right breast and nipple: Secondary | ICD-10-CM | POA: Diagnosis not present

## 2021-11-04 DIAGNOSIS — E860 Dehydration: Secondary | ICD-10-CM | POA: Diagnosis not present

## 2021-11-04 DIAGNOSIS — E1122 Type 2 diabetes mellitus with diabetic chronic kidney disease: Secondary | ICD-10-CM | POA: Diagnosis present

## 2021-11-04 DIAGNOSIS — E119 Type 2 diabetes mellitus without complications: Secondary | ICD-10-CM

## 2021-11-04 DIAGNOSIS — E782 Mixed hyperlipidemia: Secondary | ICD-10-CM | POA: Diagnosis not present

## 2021-11-04 DIAGNOSIS — Z8249 Family history of ischemic heart disease and other diseases of the circulatory system: Secondary | ICD-10-CM

## 2021-11-04 DIAGNOSIS — Z720 Tobacco use: Secondary | ICD-10-CM | POA: Diagnosis not present

## 2021-11-04 DIAGNOSIS — R21 Rash and other nonspecific skin eruption: Secondary | ICD-10-CM | POA: Diagnosis not present

## 2021-11-04 DIAGNOSIS — K219 Gastro-esophageal reflux disease without esophagitis: Secondary | ICD-10-CM | POA: Diagnosis not present

## 2021-11-04 DIAGNOSIS — D649 Anemia, unspecified: Secondary | ICD-10-CM | POA: Diagnosis not present

## 2021-11-04 DIAGNOSIS — E1169 Type 2 diabetes mellitus with other specified complication: Secondary | ICD-10-CM | POA: Diagnosis not present

## 2021-11-04 DIAGNOSIS — Z6841 Body Mass Index (BMI) 40.0 and over, adult: Secondary | ICD-10-CM

## 2021-11-04 DIAGNOSIS — L97419 Non-pressure chronic ulcer of right heel and midfoot with unspecified severity: Secondary | ICD-10-CM | POA: Diagnosis not present

## 2021-11-04 DIAGNOSIS — E1165 Type 2 diabetes mellitus with hyperglycemia: Secondary | ICD-10-CM | POA: Diagnosis not present

## 2021-11-04 DIAGNOSIS — S81802A Unspecified open wound, left lower leg, initial encounter: Secondary | ICD-10-CM | POA: Diagnosis not present

## 2021-11-04 DIAGNOSIS — F419 Anxiety disorder, unspecified: Secondary | ICD-10-CM | POA: Diagnosis present

## 2021-11-04 DIAGNOSIS — T8743 Infection of amputation stump, right lower extremity: Secondary | ICD-10-CM | POA: Diagnosis not present

## 2021-11-04 DIAGNOSIS — E871 Hypo-osmolality and hyponatremia: Secondary | ICD-10-CM | POA: Diagnosis present

## 2021-11-04 DIAGNOSIS — F319 Bipolar disorder, unspecified: Secondary | ICD-10-CM | POA: Diagnosis not present

## 2021-11-04 DIAGNOSIS — E11621 Type 2 diabetes mellitus with foot ulcer: Secondary | ICD-10-CM | POA: Diagnosis present

## 2021-11-04 DIAGNOSIS — G40909 Epilepsy, unspecified, not intractable, without status epilepticus: Secondary | ICD-10-CM | POA: Diagnosis present

## 2021-11-04 DIAGNOSIS — L0390 Cellulitis, unspecified: Secondary | ICD-10-CM | POA: Diagnosis present

## 2021-11-04 DIAGNOSIS — E785 Hyperlipidemia, unspecified: Secondary | ICD-10-CM | POA: Diagnosis present

## 2021-11-04 DIAGNOSIS — R7889 Finding of other specified substances, not normally found in blood: Secondary | ICD-10-CM | POA: Diagnosis not present

## 2021-11-04 DIAGNOSIS — Y835 Amputation of limb(s) as the cause of abnormal reaction of the patient, or of later complication, without mention of misadventure at the time of the procedure: Secondary | ICD-10-CM | POA: Diagnosis present

## 2021-11-04 DIAGNOSIS — M7989 Other specified soft tissue disorders: Secondary | ICD-10-CM | POA: Diagnosis not present

## 2021-11-04 DIAGNOSIS — I4510 Unspecified right bundle-branch block: Secondary | ICD-10-CM | POA: Diagnosis not present

## 2021-11-04 DIAGNOSIS — Z743 Need for continuous supervision: Secondary | ICD-10-CM | POA: Diagnosis not present

## 2021-11-04 DIAGNOSIS — R2689 Other abnormalities of gait and mobility: Secondary | ICD-10-CM | POA: Diagnosis not present

## 2021-11-04 DIAGNOSIS — I739 Peripheral vascular disease, unspecified: Secondary | ICD-10-CM | POA: Diagnosis not present

## 2021-11-04 DIAGNOSIS — L97523 Non-pressure chronic ulcer of other part of left foot with necrosis of muscle: Secondary | ICD-10-CM | POA: Diagnosis not present

## 2021-11-04 DIAGNOSIS — T8740 Infection of amputation stump, unspecified extremity: Secondary | ICD-10-CM | POA: Diagnosis not present

## 2021-11-04 DIAGNOSIS — Z8673 Personal history of transient ischemic attack (TIA), and cerebral infarction without residual deficits: Secondary | ICD-10-CM | POA: Diagnosis not present

## 2021-11-04 DIAGNOSIS — E08621 Diabetes mellitus due to underlying condition with foot ulcer: Secondary | ICD-10-CM | POA: Diagnosis not present

## 2021-11-04 DIAGNOSIS — N183 Chronic kidney disease, stage 3 unspecified: Secondary | ICD-10-CM | POA: Diagnosis not present

## 2021-11-04 DIAGNOSIS — J42 Unspecified chronic bronchitis: Secondary | ICD-10-CM | POA: Diagnosis not present

## 2021-11-04 DIAGNOSIS — E781 Pure hyperglyceridemia: Secondary | ICD-10-CM | POA: Diagnosis present

## 2021-11-04 DIAGNOSIS — Z6837 Body mass index (BMI) 37.0-37.9, adult: Secondary | ICD-10-CM | POA: Diagnosis not present

## 2021-11-04 DIAGNOSIS — Z89511 Acquired absence of right leg below knee: Secondary | ICD-10-CM | POA: Diagnosis not present

## 2021-11-04 DIAGNOSIS — M869 Osteomyelitis, unspecified: Secondary | ICD-10-CM | POA: Diagnosis not present

## 2021-11-04 DIAGNOSIS — F17200 Nicotine dependence, unspecified, uncomplicated: Secondary | ICD-10-CM | POA: Diagnosis present

## 2021-11-04 DIAGNOSIS — Z825 Family history of asthma and other chronic lower respiratory diseases: Secondary | ICD-10-CM

## 2021-11-04 DIAGNOSIS — Z9981 Dependence on supplemental oxygen: Secondary | ICD-10-CM | POA: Diagnosis not present

## 2021-11-04 DIAGNOSIS — Z853 Personal history of malignant neoplasm of breast: Secondary | ICD-10-CM

## 2021-11-04 DIAGNOSIS — J449 Chronic obstructive pulmonary disease, unspecified: Secondary | ICD-10-CM | POA: Diagnosis present

## 2021-11-04 DIAGNOSIS — I872 Venous insufficiency (chronic) (peripheral): Secondary | ICD-10-CM | POA: Diagnosis not present

## 2021-11-04 DIAGNOSIS — I2510 Atherosclerotic heart disease of native coronary artery without angina pectoris: Secondary | ICD-10-CM | POA: Diagnosis present

## 2021-11-04 DIAGNOSIS — M86172 Other acute osteomyelitis, left ankle and foot: Secondary | ICD-10-CM | POA: Diagnosis not present

## 2021-11-04 DIAGNOSIS — L03119 Cellulitis of unspecified part of limb: Secondary | ICD-10-CM | POA: Diagnosis present

## 2021-11-04 DIAGNOSIS — N1831 Chronic kidney disease, stage 3a: Secondary | ICD-10-CM | POA: Diagnosis present

## 2021-11-04 DIAGNOSIS — Z86718 Personal history of other venous thrombosis and embolism: Secondary | ICD-10-CM

## 2021-11-04 DIAGNOSIS — Z7951 Long term (current) use of inhaled steroids: Secondary | ICD-10-CM

## 2021-11-04 DIAGNOSIS — Z794 Long term (current) use of insulin: Secondary | ICD-10-CM | POA: Diagnosis not present

## 2021-11-04 DIAGNOSIS — Z136 Encounter for screening for cardiovascular disorders: Secondary | ICD-10-CM | POA: Diagnosis not present

## 2021-11-04 DIAGNOSIS — R7881 Bacteremia: Secondary | ICD-10-CM | POA: Insufficient documentation

## 2021-11-04 DIAGNOSIS — R000 Tachycardia, unspecified: Secondary | ICD-10-CM | POA: Diagnosis not present

## 2021-11-04 DIAGNOSIS — E1151 Type 2 diabetes mellitus with diabetic peripheral angiopathy without gangrene: Secondary | ICD-10-CM | POA: Diagnosis not present

## 2021-11-04 DIAGNOSIS — Z833 Family history of diabetes mellitus: Secondary | ICD-10-CM

## 2021-11-04 LAB — COMPREHENSIVE METABOLIC PANEL
ALT: 9 U/L (ref 0–44)
AST: 18 U/L (ref 15–41)
Albumin: 2.3 g/dL — ABNORMAL LOW (ref 3.5–5.0)
Alkaline Phosphatase: 183 U/L — ABNORMAL HIGH (ref 38–126)
Anion gap: 8 (ref 5–15)
BUN: 19 mg/dL (ref 6–20)
CO2: 25 mmol/L (ref 22–32)
Calcium: 8.2 mg/dL — ABNORMAL LOW (ref 8.9–10.3)
Chloride: 96 mmol/L — ABNORMAL LOW (ref 98–111)
Creatinine, Ser: 1.12 mg/dL — ABNORMAL HIGH (ref 0.44–1.00)
GFR, Estimated: 59 mL/min — ABNORMAL LOW (ref >60)
Glucose, Bld: 329 mg/dL — ABNORMAL HIGH (ref 70–99)
Potassium: 4.8 mmol/L (ref 3.5–5.1)
Sodium: 129 mmol/L — ABNORMAL LOW (ref 135–145)
Total Bilirubin: 0.9 mg/dL (ref 0.3–1.2)
Total Protein: 9.2 g/dL — ABNORMAL HIGH (ref 6.5–8.1)

## 2021-11-04 LAB — CBC
HCT: 22.4 % — ABNORMAL LOW (ref 36.0–46.0)
Hemoglobin: 6.6 g/dL — ABNORMAL LOW (ref 12.0–15.0)
MCH: 23.7 pg — ABNORMAL LOW (ref 26.0–34.0)
MCHC: 29.5 g/dL — ABNORMAL LOW (ref 30.0–36.0)
MCV: 80.3 fL (ref 80.0–100.0)
Platelets: 320 10*3/uL (ref 150–400)
RBC: 2.79 MIL/uL — ABNORMAL LOW (ref 3.87–5.11)
RDW: 18.4 % — ABNORMAL HIGH (ref 11.5–15.5)
WBC: 9.6 10*3/uL (ref 4.0–10.5)
nRBC: 0 % (ref 0.0–0.2)

## 2021-11-04 LAB — CBC WITH DIFFERENTIAL/PLATELET
Abs Immature Granulocytes: 0.18 10*3/uL — ABNORMAL HIGH (ref 0.00–0.07)
Basophils Absolute: 0.1 10*3/uL (ref 0.0–0.1)
Basophils Relative: 1 %
Eosinophils Absolute: 0.3 10*3/uL (ref 0.0–0.5)
Eosinophils Relative: 3 %
HCT: 27.3 % — ABNORMAL LOW (ref 36.0–46.0)
Hemoglobin: 7.8 g/dL — ABNORMAL LOW (ref 12.0–15.0)
Immature Granulocytes: 2 %
Lymphocytes Relative: 8 %
Lymphs Abs: 0.9 10*3/uL (ref 0.7–4.0)
MCH: 23.8 pg — ABNORMAL LOW (ref 26.0–34.0)
MCHC: 28.6 g/dL — ABNORMAL LOW (ref 30.0–36.0)
MCV: 83.2 fL (ref 80.0–100.0)
Monocytes Absolute: 0.7 10*3/uL (ref 0.1–1.0)
Monocytes Relative: 6 %
Neutro Abs: 9 10*3/uL — ABNORMAL HIGH (ref 1.7–7.7)
Neutrophils Relative %: 80 %
Platelets: 367 10*3/uL (ref 150–400)
RBC: 3.28 MIL/uL — ABNORMAL LOW (ref 3.87–5.11)
RDW: 18.4 % — ABNORMAL HIGH (ref 11.5–15.5)
WBC: 11.1 10*3/uL — ABNORMAL HIGH (ref 4.0–10.5)
nRBC: 0 % (ref 0.0–0.2)

## 2021-11-04 LAB — URINALYSIS, ROUTINE W REFLEX MICROSCOPIC
Bacteria, UA: NONE SEEN
Bilirubin Urine: NEGATIVE
Glucose, UA: >=500 mg/dL — AB
Ketones, ur: NEGATIVE mg/dL
Leukocytes,Ua: NEGATIVE
Nitrite: NEGATIVE
Protein, ur: 100 mg/dL — AB
Specific Gravity, Urine: 1.016 (ref 1.005–1.030)
pH: 5 (ref 5.0–8.0)

## 2021-11-04 LAB — GLUCOSE, CAPILLARY: Glucose-Capillary: 291 mg/dL — ABNORMAL HIGH (ref 70–99)

## 2021-11-04 LAB — LACTIC ACID, PLASMA
Lactic Acid, Venous: 1.2 mmol/L (ref 0.5–1.9)
Lactic Acid, Venous: 0.9 mmol/L (ref 0.5–1.9)

## 2021-11-04 LAB — C-REACTIVE PROTEIN: CRP: 16.2 mg/dL — ABNORMAL HIGH (ref <1.0)

## 2021-11-04 LAB — PROTIME-INR
INR: 1.4 — ABNORMAL HIGH (ref 0.8–1.2)
Prothrombin Time: 17.4 seconds — ABNORMAL HIGH (ref 11.4–15.2)

## 2021-11-04 LAB — SAMPLE TO BLOOD BANK

## 2021-11-04 LAB — SEDIMENTATION RATE: Sed Rate: 126 mm/hr — ABNORMAL HIGH (ref 0–30)

## 2021-11-04 MED ORDER — SODIUM CHLORIDE 0.9 % IV SOLN
20.0000 mL/h | INTRAVENOUS | Status: AC
  Administered 2021-11-05 (×2): 20 mL/h via INTRAVENOUS

## 2021-11-04 MED ORDER — SODIUM CHLORIDE 0.9% FLUSH
3.0000 mL | Freq: Two times a day (BID) | INTRAVENOUS | Status: DC
  Administered 2021-11-04 – 2021-11-16 (×22): 3 mL via INTRAVENOUS

## 2021-11-04 MED ORDER — HEPARIN SODIUM (PORCINE) 5000 UNIT/ML IJ SOLN
5000.0000 [IU] | Freq: Two times a day (BID) | INTRAMUSCULAR | Status: DC
  Administered 2021-11-04 – 2021-11-20 (×24): 5000 [IU] via SUBCUTANEOUS
  Filled 2021-11-04 (×30): qty 1

## 2021-11-04 MED ORDER — VANCOMYCIN HCL IN DEXTROSE 1-5 GM/200ML-% IV SOLN
1000.0000 mg | Freq: Once | INTRAVENOUS | Status: AC
  Administered 2021-11-04: 1000 mg via INTRAVENOUS
  Filled 2021-11-04: qty 200

## 2021-11-04 MED ORDER — VANCOMYCIN HCL IN DEXTROSE 1-5 GM/200ML-% IV SOLN: 1000.0000 mg | Freq: Once | INTRAVENOUS | Status: DC

## 2021-11-04 MED ORDER — ADULT MULTIVITAMIN W/MINERALS CH
1.0000 | ORAL_TABLET | Freq: Every day | ORAL | Status: DC
  Administered 2021-11-05 – 2021-11-20 (×14): 1 via ORAL
  Filled 2021-11-04 (×15): qty 1

## 2021-11-04 MED ORDER — INSULIN ASPART 100 UNIT/ML IJ SOLN
0.0000 [IU] | Freq: Three times a day (TID) | INTRAMUSCULAR | Status: DC
  Administered 2021-11-04: 8 [IU] via SUBCUTANEOUS
  Administered 2021-11-05 (×2): 5 [IU] via SUBCUTANEOUS
  Administered 2021-11-05: 3 [IU] via SUBCUTANEOUS
  Administered 2021-11-06: 5 [IU] via SUBCUTANEOUS
  Administered 2021-11-06: 3 [IU] via SUBCUTANEOUS
  Administered 2021-11-06: 5 [IU] via SUBCUTANEOUS
  Administered 2021-11-07: 11 [IU] via SUBCUTANEOUS
  Administered 2021-11-07 – 2021-11-08 (×5): 8 [IU] via SUBCUTANEOUS
  Administered 2021-11-09: 5 [IU] via SUBCUTANEOUS
  Administered 2021-11-10 – 2021-11-14 (×12): 3 [IU] via SUBCUTANEOUS
  Administered 2021-11-14 (×2): 2 [IU] via SUBCUTANEOUS
  Administered 2021-11-15 – 2021-11-17 (×6): 5 [IU] via SUBCUTANEOUS
  Administered 2021-11-17: 8 [IU] via SUBCUTANEOUS
  Administered 2021-11-17 – 2021-11-18 (×3): 5 [IU] via SUBCUTANEOUS
  Administered 2021-11-18 – 2021-11-19 (×2): 8 [IU] via SUBCUTANEOUS
  Administered 2021-11-19 (×2): 5 [IU] via SUBCUTANEOUS
  Administered 2021-11-20: 3 [IU] via SUBCUTANEOUS
  Filled 2021-11-04 (×41): qty 1

## 2021-11-04 MED ORDER — HYDROMORPHONE HCL 1 MG/ML IJ SOLN
0.5000 mg | Freq: Once | INTRAMUSCULAR | Status: AC
  Administered 2021-11-04: 0.5 mg via INTRAVENOUS
  Filled 2021-11-04: qty 1

## 2021-11-04 NOTE — Telephone Encounter (Signed)
[name] with [facility] called concerning pts wounds and picc-line site.  [name] states that the pt has 6 wounds all together. One on the heel of her foot that is black, rubbery looking possibly dry gangrene right above the achilles tendon, one on the calf area, the stump leg wound site from surgery, the inner thigh area and bilaterally on her buttocks.  Also states that pts o2 stat was only around 90-92 percent. [name] advised her to put her oxygen on and to go to the ED for treatment of wounds.    ?

## 2021-11-04 NOTE — Assessment & Plan Note (Addendum)
?   Continue current IV antibiotics, ID is consulted, appreciate their recommendations as well ?? 11/05/21 underwent Excisional debridement left below-knee amputation stump including skin subcutaneous tissues muscle and bone and application of a wound VAC w/ vascular surgery  ?

## 2021-11-04 NOTE — ED Notes (Addendum)
PICC line not working after starting antibiotics. Vanc stopped at this time. Message sent to [facility], MD to verify placement. ?

## 2021-11-04 NOTE — H&P (Signed)
?History and Physical  ? ? ?PatientBrynli Foster RWE:[id number] DOB: 16-Feb-1971 ?DOA: 11/04/2021 ?DOS: the patient was seen and examined on 11/05/2021 ?PCP: Pleas Koch, NP  ?Patient coming from: Home ? ?Chief Complaint:  ?Chief Complaint  ?Patient presents with  ? Wound Infection  ? ?HPI: [name] is a 51 y.o. female with medical history significant of tobacco abuse, congestive heart failure, heart disease, presenting with concerns for wound infection on her postsurgical site.  Patient had a left below-knee amputation on 4/3 and is currently being followed by a wound care nurse.  Patient reported that they were told that the area has been draining purulent drainage and there is warmth and pain at the site.  Patient also has wound on her right heel.  Patient is awake but sleepy suspect due to pain medication pinpoint pupils otherwise reactive bilaterally.  Patient's right heel has a 3 cm wound with erythema and induration drainage and a black eschar.  Patient is somewhat sleepy and is not able to recall detailed information.  When asked about the rash on both her arms she states that its been going on since about yesterday and it happens because of the pain medication.  Husband at bedside states that they have not changed her pain medicine.  Patient states that she does not smoke they are not giving her any cigarettes. ? ?Review of Systems: Review of Systems  ?Musculoskeletal:  Positive for joint pain.  ?     ERYTHEMA AND SWELLING AND DRAINING AT SITE OF LEFT BKA. ?And rt leg also has wound.   ?All other systems reviewed and are negative. ? ?Past Medical History:  ?Diagnosis Date  ? Anxiety   ? takes Prozac daily  ? Anxiety   ? Aortic valve calcification   ? Asthma   ? Advair and Spirva daily  ? Asthma   ? Bipolar disorder ([place])   ? Breast cancer, right breast ([place]) 12/23/2019  ? CAD (coronary artery disease)   ? a. LHC 11/2013 done for CP/fluid retention: mild disease in prox LAD, mild-mod disease in mRCA,  EF 60% with normal LVEDP. b. Normal nuc 03/2016.  ? Cancer [facility])   ? Cellulitis and abscess of trunk 03/12/2020  ? CHF (congestive heart failure) ([place])   ? Chronic diastolic CHF (congestive heart failure) ([place])   ? Chronic heart failure with preserved ejection fraction ([place]) 11/16/2018  ? CKD (chronic kidney disease), stage II   ? COPD (chronic obstructive pulmonary disease) ([place])   ? a. nocturnal O2.  ? COPD (chronic obstructive pulmonary disease) ([place])   ? Coronary artery disease   ? Decreased urine stream   ? Diabetes mellitus   ? Diabetes mellitus without complication ([place])   ? Dyspnea   ? Elevated C-reactive protein (CRP) 01/29/2018  ? Elevated sedimentation rate 01/29/2018  ? Elevated serum hCG 01/19/2018  ? Family history of adverse reaction to anesthesia   ? mom gets nauseated  ? Foot pain, bilateral 05/22/2018  ? GERD (gastroesophageal reflux disease)   ? takes Pepcid daily  ? History of blood clots   ? left leg 3-36yr ago  ? Hyperlipidemia   ? Hypertension   ? Hypertriglyceridemia   ? Inguinal hernia, left 01/2015  ? Muscle spasm   ? Open wound of genital labia   ? Peripheral neuropathy   ? RBBB   ? Seizures ([place]   ? Sepsis ([place] 01/19/2018  ? Sinus tachycardia   ? a. persistent since 2009.  ? Smokers'  cough ([place])   ? Status post mastectomy, right 01/14/2020  ? Stroke [facility]) 1989  ? left sided weakness  ? TIA (transient ischemic attack)   ? Tobacco abuse   ? Vulvar abscess 01/23/2018  ? ?Past Surgical History:  ?Procedure Laterality Date  ? AMPUTATION Left 10/11/2021  ? Procedure: AMPUTATION BELOW KNEE;  Surgeon: Algernon Huxley, MD;  Location: [facility];  Service: General;  Laterality: Left;  ? APPLICATION OF WOUND VAC Right 01/29/2020  ? Procedure: APPLICATION OF WOUND VAC;  Surgeon: Ronny Bacon, MD;  Location: [facility];  Service: General;  Laterality: Right;  ? BREAST BIOPSY Right 11/06/2019  ? [place] core path pending venus clip  ? GRAFT APPLICATION  5/46/2703  ? Procedure: GRAFT APPLICATION;  Surgeon: [name],  DPM;  Location: [facility];  Service: Podiatry;;  ? HERNIA REPAIR Left   ? INCISION AND DRAINAGE Bilateral 09/21/2021  ? Procedure: INCISION AND DRAINAGE;  Surgeon: [name], DPM;  Location: [facility];  Service: Podiatry;  Laterality: Bilateral;  ? INCISION AND DRAINAGE ABSCESS N/A 01/26/2018  ? Procedure: INCISION AND DEBRIDEMENT OF VULVAR NECROTIZING SOFT TISSUE INFECTION;  Surgeon: Greer Pickerel, MD;  Location: [place];  Service: General;  Laterality: N/A;  ? INCISION AND DRAINAGE ABSCESS N/A 07/21/2021  ? Procedure: INCISION AND DRAINAGE ABSCESS Scalp;  Surgeon: Robert Bellow, MD;  Location: [facility];  Service: General;  Laterality: N/A;  scalp and right abdomen  ? INCISION AND DRAINAGE PERIRECTAL ABSCESS N/A 01/22/2018  ? Procedure: IRRIGATION AND DEBRIDEMENT LABIAL/VULVAR AREA;  Surgeon: Coralie Keens, MD;  Location: [place];  Service: General;  Laterality: N/A;  ? [place] N/A 01/29/2018  ? Procedure: IRRIGATION AND DEBRIDEMENT VULVA;  Surgeon: Excell Seltzer, MD;  Location: [place];  Service: General;  Laterality: N/A;  ? INGUINAL HERNIA REPAIR Left 04/08/2015  ? Procedure: OPEN LEFT INGUINAL HERNIA REPAIR WITH MESH;  Surgeon: Ralene Ok, MD;  Location: [place];  Service: General;  Laterality: Left;  ? INSERTION OF MESH Left 04/08/2015  ? Procedure: INSERTION OF MESH;  Surgeon: Ralene Ok, MD;  Location: [place];  Service: General;  Laterality: Left;  ? IRRIGATION AND DEBRIDEMENT ABSCESS N/A 07/21/2021  ? Procedure: IRRIGATION AND DEBRIDEMENT ABSCESS abdomen;  Surgeon: Robert Bellow, MD;  Location: [facility];  Service: General;  Laterality: N/A;  ? LAPAROSCOPY    ? Endometriosis  ? LEFT HEART CATHETERIZATION WITH CORONARY ANGIOGRAM N/A 12/05/2013  ? Procedure: LEFT HEART CATHETERIZATION WITH CORONARY ANGIOGRAM;  Surgeon: Jettie Booze, MD;  Location: [facility] CATH LAB;  Service: Cardiovascular;  Laterality: N/A;  ? LOWER EXTREMITY ANGIOGRAPHY Left 09/22/2021  ?  Procedure: Lower Extremity Angiography;  Surgeon: Katha Cabal, MD;  Location: [place] CV LAB;  Service: Cardiovascular;  Laterality: Left;  ? MASTECTOMY Right   ? right kidney drained    ? SIMPLE MASTECTOMY WITH AXILLARY SENTINEL NODE BIOPSY Right 12/23/2019  ? Procedure: Right SIMPLE MASTECTOMY WITH AXILLARY SENTINEL NODE BIOPSY;  Surgeon: Ronny Bacon, MD;  Location: [facility];  Service: General;  Laterality: Right;  ? TEE WITHOUT CARDIOVERSION N/A 11/28/2013  ? Procedure: TRANSESOPHAGEAL ECHOCARDIOGRAM (TEE);  Surgeon: Thayer Headings, MD;  Location: [place];  Service: Cardiovascular;  Laterality: N/A;  ? TEE WITHOUT CARDIOVERSION N/A 10/13/2021  ? Procedure: TRANSESOPHAGEAL ECHOCARDIOGRAM (TEE);  Surgeon: Corey Skains, MD;  Location: [facility];  Service: Cardiovascular;  Laterality: N/A;  ? WOUND DEBRIDEMENT Right 01/29/2020  ? Procedure: DEBRIDEMENT WOUND, Excisional debridement  skin, subcutaneous and muscle right chest wall;  Surgeon: Ronny Bacon, MD;  Location: [facility];  Service: General;  Laterality: Right;  ? ?Social History:  reports that she has been smoking cigarettes. She started smoking about 40 years ago. She has a 9.00 pack-year smoking history. She has never used smokeless tobacco. She reports that she does not drink alcohol and does not use drugs. ? ?Allergies  ?Allergen Reactions  ? Adhesive [Tape] Rash and Other (See Comments)  ?  TAKES OFF THE SKIN (CERTAIN MEDICAL TAPES DO THIS!!)  ? Metoprolol Shortness Of Breath  ?  Occurrence of shortness of breath after 3 days  ? Montelukast Shortness Of Breath  ? Morphine Sulfate Anaphylaxis, Shortness Of Breath and Nausea And Vomiting  ?  Swollen Throat - Able to tolerate dilaudid  ? Penicillins Anaphylaxis, Hives and Shortness Of Breath  ?  Throat swells ?Has patient had a PCN reaction causing immediate rash, facial/tongue/throat swelling, SOB or lightheadedness with hypotension: Yes ?Has patient had a PCN reaction causing  severe rash involving mucus membranes or skin necrosis: No ?Has patient had a PCN reaction that required hospitalization: Yes ?Has patient had a PCN reaction occurring within the last 10 years: No ?If all of the

## 2021-11-04 NOTE — Telephone Encounter (Signed)
Nurse called and stated the wound where the staples are located needs to be looked at by a provider it is smelly and the drainage is of foul color and there is an opening in the wound.  I spoke with Eulogio Ditch NP she agreed the pt needs to be seen.  We are bringing the pt in today at 1:30pm.  The spouse states concerns with transporting her to our office but he will try he best to get her here.  If he can not get her in the car they will call EMS to take her to [facility] to be evaluated. This is the choice of the family.  Family will let us know if they end up at [facility].  Otherwise pt will be seen in office at 1:30 today. ?

## 2021-11-04 NOTE — Progress Notes (Signed)
The purpose of this virtual visit is to provide medical care while limiting exposure to the novel coronavirus (COVID19) for both patient and office staff. ?  ?Consent was obtained for phone visit:  Yes.   ?Answered questions that patient had about telehealth interaction:  Yes.   ?I discussed the limitations, risks, security and privacy concerns of performing an evaluation and management service by telephone. I also discussed with the patient that there may be a patient responsible charge related to this service. The patient expressed understanding and agreed to proceed. ?  ?Patient Location: Home ?Provider Location: office ?Her husband was on the visit ?He said that Ms. Behney was sleeping ?Pt is being treated for MRSA bacteremia and MRSA wounds ?Recent BKA left leg ?As per her husband she has some discharge from the stump- They have an appt with vascular at 1.30 Pm this afternoon ?On 11/07/21 she will complete Iv vanco and PICC will be removed- She may go on PO antibiotic after that ?Total time spent 10 min ?Informed Vascular NP regarding culture and pictures ?Later I heard from vascular that they did not keep the appt ?

## 2021-11-04 NOTE — ED Notes (Signed)
Pt PICC line flushed at this time and is flowing with the vancomycin at this time. Will monitor line ?

## 2021-11-04 NOTE — ED Provider Notes (Signed)
? ?[facility] ?Provider Note ? ? ? Event Date/Time  ? First MD Initiated Contact with Patient 11/04/21 1516   ?  (approximate) ? ? ?History  ? ?Wound Infection ? ? ?HPI ? ?[name] is a 51 y.o. female with history of peripheral vascular disease, hypertension, hyperlipidemia, diabetes, COPD, asthma, CVA, GERD, anemia, CAD, CKD, and CHF who presents for concern of wound infections to her legs as well as anemia.  The patient had a left below-knee amputation on 4/3.  She was evaluated by wound nurse today and it was noted that the area of the surgical wound to her left leg had purulent discharge and erythema as well as possible opening of the staples.  There was also a concerning wound to the right heel.  The patient was referred to the ED.  She also apparently had recent labs showing significant anemia with a hemoglobin of 6.7.  The patient reports pain to both legs but denies other acute complaints. ? ?  ? ? ?Physical Exam  ? ?Triage Vital Signs: ?ED Triage Vitals  ?Enc Vitals Group  ?   BP 11/04/21 1504 121/68  ?   Pulse Rate 11/04/21 1504 (!) 105  ?   Resp 11/04/21 1504 18  ?   Temp 11/04/21 1504 98.2 ?F (36.8 ?C)  ?   Temp Source 11/04/21 1504 Oral  ?   SpO2 11/04/21 1459 96 %  ?   Weight 11/04/21 1501 216 lb (98 kg)  ?   Height 11/04/21 1501 '5\' 4"'$  (1.626 m)  ?   Head Circumference --   ?   Peak Flow --   ?   Pain Score 11/04/21 1501 8  ?   Pain Loc --   ?   Pain Edu? --   ?   Excl. in [place]? --   ? ? ?Most recent vital signs: ?Vitals:  ? 11/04/21 1715 11/04/21 1815  ?BP: (!) 147/72 136/67  ?Pulse: 99 97  ?Resp: 18 18  ?Temp:    ?SpO2: 100% 99%  ? ? ?General: Awake, no distress.  ?CV:  Good peripheral perfusion.  ?Resp:  Normal effort.  ?Abd:  No distention.  ?Other:  Right heel with approximately 3 cm wound with black eschar and surrounding erythema, induration, and some purulent drainage.  Erythema to the right lower leg.  Left BKA wounds with surrounding erythema and induration and  malodorous purulent discharge. ? ? ?ED Results / Procedures / Treatments  ? ?Labs ?(all labs ordered are listed, but only abnormal results are displayed) ?Labs Reviewed  ?COMPREHENSIVE METABOLIC PANEL - Abnormal; Notable for the following components:  ?    Result Value  ? Sodium 129 (*)   ? Chloride 96 (*)   ? Glucose, Bld 329 (*)   ? Creatinine, Ser 1.12 (*)   ? Calcium 8.2 (*)   ? Total Protein 9.2 (*)   ? Albumin 2.3 (*)   ? Alkaline Phosphatase 183 (*)   ? GFR, Estimated 59 (*)   ? All other components within normal limits  ?CBC WITH DIFFERENTIAL/PLATELET - Abnormal; Notable for the following components:  ? WBC 11.1 (*)   ? RBC 3.28 (*)   ? Hemoglobin 7.8 (*)   ? HCT 27.3 (*)   ? MCH 23.8 (*)   ? MCHC 28.6 (*)   ? RDW 18.4 (*)   ? Neutro Abs 9.0 (*)   ? Abs Immature Granulocytes 0.18 (*)   ? All other components within normal  limits  ?URINALYSIS, ROUTINE W REFLEX MICROSCOPIC - Abnormal; Notable for the following components:  ? Color, Urine YELLOW (*)   ? APPearance CLEAR (*)   ? Glucose, UA >=500 (*)   ? Hgb urine dipstick SMALL (*)   ? Protein, ur 100 (*)   ? All other components within normal limits  ?SEDIMENTATION RATE - Abnormal; Notable for the following components:  ? Sed Rate 126 (*)   ? All other components within normal limits  ?PROTIME-INR - Abnormal; Notable for the following components:  ? Prothrombin Time 17.4 (*)   ? INR 1.4 (*)   ? All other components within normal limits  ?CULTURE, BLOOD (ROUTINE X 2)  ?CULTURE, BLOOD (ROUTINE X 2)  ?LACTIC ACID, PLASMA  ?LACTIC ACID, PLASMA  ?C-REACTIVE PROTEIN  ?SAMPLE TO BLOOD BANK  ?TYPE AND SCREEN  ? ? ? ?EKG ? ?ED ECG REPORT ?IArta Silence, the attending physician, personally viewed and interpreted this ECG. ? ?Date: 11/04/2021 ?EKG Time: 1505 ?Rate: 104 ?Rhythm: sinus tachycardia ?QRS Axis: normal ?Intervals: RBBB, LAFB ?ST/T Wave abnormalities: normal ?Narrative Interpretation: no evidence of acute ischemia; no significant change when compared to  EKG of 10/07/2021 ? ? ? ?RADIOLOGY ? ?I independently viewed and interpreted the images were all of the studies below. ? ?Chest x-ray: Bilateral interstitial edema, cardiomegaly ?XR R foot: Calcaneal fracture, soft tissue swelling ?XR L knee: No bony destruction ? ? ? ?PROCEDURES: ? ?Critical Care performed: No ? ?Procedures ? ? ?MEDICATIONS ORDERED IN ED: ?Medications  ?vancomycin (VANCOCIN) IVPB 1000 mg/200 mL premix (1,000 mg Intravenous New Bag/Given 11/04/21 1844)  ?HYDROmorphone (DILAUDID) injection 0.5 mg (0.5 mg Intravenous Given 11/04/21 1709)  ? ? ? ?IMPRESSION / MDM / ASSESSMENT AND PLAN / ED COURSE  ?I reviewed the triage vital signs and the nursing notes. ? ?51 year old female with PMH as noted above presents with concern for wound infection after a left BKA earlier this month as well as an infection to the wound in her right heel.  The patient also was noted to have anemia on recent labs. ? ?On exam the vital signs are normal except for hypertension and borderline tachycardia.  The patient has wounds with purulent drainage to the right heel and left knee amputation site as noted above.  The rest of the exam is unremarkable for acute findings. ? ?I reviewed the past medical records.  The patient had a left below-knee amputation here on 4/3.  She was evaluated by nurse today and had a virtual visit today, ultimately being referred to the ED for further evaluation. ? ?Overall presentation is concerning for cellulitis/wound infection, although osteomyelitis or deeper soft tissue infection is possible.  At this time the patient has no evidence for systemic infection or sepsis.  We will obtain lab work-up, x-rays of the relevant areas, and consult vascular surgery.  I anticipate the patient will need admission for IV antibiotics and possible surgical intervention. ? ?The patient is on the cardiac monitor to evaluate for evidence of arrhythmia and/or significant heart rate  changes. ? ?----------------------------------------- ?6:25 PM on 11/04/2021 ?----------------------------------------- ? ?Lab work-up reveals mild leukocytosis, normal lactate, and hyperglycemia with borderline hyponatremia. ? ?I consulted Dr. Delana Meyer from vascular surgery who agreed with the current management, recommended IV antibiotics and admission to the hospitalist service.  I ordered IV vancomycin.  The patient has anaphylaxis to penicillins and states she has never been given cephalosporins that she knows of. ? ?I then consulted Dr. Posey Pronto from the hospitalist service;  based on her discussion she agrees to admit the patient. ? ? ?FINAL CLINICAL IMPRESSION(S) / ED DIAGNOSES  ? ?Final diagnoses:  ?Wound infection  ? ? ? ?Rx / DC Orders  ? ?ED Discharge Orders   ? ? None  ? ?  ? ? ? ?Note:  This document was prepared using Dragon voice recognition software and may include unintentional dictation errors.  ?  ?Arta Silence, MD ?11/04/21 1920 ? ?

## 2021-11-04 NOTE — Progress Notes (Addendum)
Pharmacy Antibiotic Note ? ?[name] is a 51 y.o. female admitted on 11/04/2021.  Pharmacy has been consulted for vancomycin dosing. Pt on 1 g IV vancomycin PTA for MRSA bacteremia - was scheduled to continue until 11/07/21. Med rec tech confirmed with pt husband last dose was yesterday (4/26). Pt was scheduled for video visit with ID on 11/02/21 but unable to get video to work initially and appt was rescheduled for Thursday. Pt has received 1 g IV vanc in ED today (4/27).  ? ?Plan: ?Pt received 1 g IV vanc in ED. Will order vanc random with AM labs and assess further dosing based on level.  ? ?Height: '5\' 4"'$  (162.6 cm) ?Weight: 98 kg (216 lb) ?IBW/kg (Calculated) : 54.7 ? ?Temp (24hrs), Avg:98.2 ?F (36.8 ?C), Min:98.2 ?F (36.8 ?C), Max:98.2 ?F (36.8 ?C) ? ?Recent Labs  ?Lab 11/04/21 ?1524 11/04/21 ?1820  ?WBC 11.1*  --   ?CREATININE 1.12*  --   ?LATICACIDVEN  --  1.2  ?  ?Estimated Creatinine Clearance: 66.8 mL/min (A) (by C-G formula based on SCr of 1.12 mg/dL (H)).   ? ?Allergies  ?Allergen Reactions  ? Adhesive [Tape] Rash and Other (See Comments)  ?  TAKES OFF THE SKIN (CERTAIN MEDICAL TAPES DO THIS!!)  ? Metoprolol Shortness Of Breath  ?  Occurrence of shortness of breath after 3 days  ? Montelukast Shortness Of Breath  ? Morphine Sulfate Anaphylaxis, Shortness Of Breath and Nausea And Vomiting  ?  Swollen Throat - Able to tolerate dilaudid  ? Penicillins Anaphylaxis, Hives and Shortness Of Breath  ?  Throat swells ?Has patient had a PCN reaction causing immediate rash, facial/tongue/throat swelling, SOB or lightheadedness with hypotension: Yes ?Has patient had a PCN reaction causing severe rash involving mucus membranes or skin necrosis: No ?Has patient had a PCN reaction that required hospitalization: Yes ?Has patient had a PCN reaction occurring within the last 10 years: No ?If all of the above answers are "NO", then may proceed with Cephalosporin use. ?  ? Silicone Rash  ?  Takes off the skin (certain  medical tapes do this)  ? Diltiazem Swelling  ? Doxycycline Hives  ? Gabapentin Swelling  ? Midodrine   ?  Lightheaded and falling down  ? Percocet [Oxycodone-Acetaminophen] Rash  ? ? ?Antimicrobials this admission: ?4/27 vancomycin >>  ? ?Dose adjustments this admission: ? ? ?Microbiology results: ?4/27 BCx: pending ?3/31 Bcx: MRSA ?3/31 Wound: MRSA ? ? ?Thank you for allowing pharmacy to be a part of this patient?s care. ? ?Forde Dandy Jerimyah Vandunk ?11/04/2021 8:27 PM ? ?

## 2021-11-04 NOTE — Telephone Encounter (Signed)
Spoke with [facility] and sent over orders to them for staple removal.  Called Pt and let her know that the orders have been sent and she states understanding. ?

## 2021-11-04 NOTE — ED Notes (Signed)
Lab called for a second time to come collect blood work. Siadecki, MD aware. ?

## 2021-11-04 NOTE — ED Triage Notes (Signed)
Patient to ED via [facility] from home for low hemoglobin (6.7 per home health RN) and left leg infection from ambulation approx 2 months ago. AOx4  ?

## 2021-11-04 NOTE — ED Notes (Addendum)
This RN and NT attempted for IV and blood work due to PICC line not pulling back. Lab called to attempt for rest of blood work. Siadecki, MD aware. ?

## 2021-11-05 ENCOUNTER — Inpatient Hospital Stay: Payer: BC Managed Care – PPO | Admitting: Anesthesiology

## 2021-11-05 ENCOUNTER — Encounter
Admission: EM | Disposition: A | Discharge status: Home Health | Payer: Self-pay | Source: Home / Self Care | Attending: Internal Medicine

## 2021-11-05 ENCOUNTER — Other Ambulatory Visit: Payer: Self-pay

## 2021-11-05 ENCOUNTER — Encounter: Payer: Self-pay | Admitting: Internal Medicine

## 2021-11-05 DIAGNOSIS — T148XXA Other injury of unspecified body region, initial encounter: Secondary | ICD-10-CM

## 2021-11-05 DIAGNOSIS — N183 Chronic kidney disease, stage 3 unspecified: Secondary | ICD-10-CM

## 2021-11-05 DIAGNOSIS — F17210 Nicotine dependence, cigarettes, uncomplicated: Secondary | ICD-10-CM | POA: Diagnosis present

## 2021-11-05 DIAGNOSIS — Z6841 Body Mass Index (BMI) 40.0 and over, adult: Secondary | ICD-10-CM | POA: Diagnosis not present

## 2021-11-05 DIAGNOSIS — I5032 Chronic diastolic (congestive) heart failure: Secondary | ICD-10-CM | POA: Diagnosis present

## 2021-11-05 DIAGNOSIS — E1152 Type 2 diabetes mellitus with diabetic peripheral angiopathy with gangrene: Secondary | ICD-10-CM | POA: Diagnosis present

## 2021-11-05 DIAGNOSIS — Z9981 Dependence on supplemental oxygen: Secondary | ICD-10-CM | POA: Diagnosis not present

## 2021-11-05 DIAGNOSIS — L03115 Cellulitis of right lower limb: Secondary | ICD-10-CM | POA: Diagnosis not present

## 2021-11-05 DIAGNOSIS — T8742 Infection of amputation stump, left upper extremity: Secondary | ICD-10-CM | POA: Diagnosis not present

## 2021-11-05 DIAGNOSIS — E860 Dehydration: Secondary | ICD-10-CM | POA: Diagnosis present

## 2021-11-05 DIAGNOSIS — Z89512 Acquired absence of left leg below knee: Secondary | ICD-10-CM | POA: Diagnosis not present

## 2021-11-05 DIAGNOSIS — T8740 Infection of amputation stump, unspecified extremity: Secondary | ICD-10-CM | POA: Diagnosis not present

## 2021-11-05 DIAGNOSIS — L089 Local infection of the skin and subcutaneous tissue, unspecified: Secondary | ICD-10-CM | POA: Diagnosis not present

## 2021-11-05 DIAGNOSIS — N1831 Chronic kidney disease, stage 3a: Secondary | ICD-10-CM | POA: Diagnosis present

## 2021-11-05 DIAGNOSIS — E1165 Type 2 diabetes mellitus with hyperglycemia: Secondary | ICD-10-CM | POA: Diagnosis present

## 2021-11-05 DIAGNOSIS — E08621 Diabetes mellitus due to underlying condition with foot ulcer: Secondary | ICD-10-CM | POA: Diagnosis not present

## 2021-11-05 DIAGNOSIS — Z720 Tobacco use: Secondary | ICD-10-CM

## 2021-11-05 DIAGNOSIS — Z9011 Acquired absence of right breast and nipple: Secondary | ICD-10-CM | POA: Diagnosis not present

## 2021-11-05 DIAGNOSIS — G40909 Epilepsy, unspecified, not intractable, without status epilepticus: Secondary | ICD-10-CM | POA: Diagnosis present

## 2021-11-05 DIAGNOSIS — I96 Gangrene, not elsewhere classified: Secondary | ICD-10-CM | POA: Diagnosis not present

## 2021-11-05 DIAGNOSIS — L03116 Cellulitis of left lower limb: Secondary | ICD-10-CM | POA: Diagnosis present

## 2021-11-05 DIAGNOSIS — T8754 Necrosis of amputation stump, left lower extremity: Secondary | ICD-10-CM

## 2021-11-05 DIAGNOSIS — E871 Hypo-osmolality and hyponatremia: Secondary | ICD-10-CM | POA: Diagnosis present

## 2021-11-05 DIAGNOSIS — Y835 Amputation of limb(s) as the cause of abnormal reaction of the patient, or of later complication, without mention of misadventure at the time of the procedure: Secondary | ICD-10-CM | POA: Diagnosis present

## 2021-11-05 DIAGNOSIS — L03119 Cellulitis of unspecified part of limb: Secondary | ICD-10-CM

## 2021-11-05 DIAGNOSIS — D649 Anemia, unspecified: Secondary | ICD-10-CM

## 2021-11-05 DIAGNOSIS — Z794 Long term (current) use of insulin: Secondary | ICD-10-CM

## 2021-11-05 DIAGNOSIS — E11621 Type 2 diabetes mellitus with foot ulcer: Secondary | ICD-10-CM | POA: Diagnosis present

## 2021-11-05 DIAGNOSIS — M869 Osteomyelitis, unspecified: Secondary | ICD-10-CM | POA: Diagnosis not present

## 2021-11-05 DIAGNOSIS — E781 Pure hyperglyceridemia: Secondary | ICD-10-CM | POA: Diagnosis present

## 2021-11-05 DIAGNOSIS — J449 Chronic obstructive pulmonary disease, unspecified: Secondary | ICD-10-CM | POA: Diagnosis present

## 2021-11-05 DIAGNOSIS — T8744 Infection of amputation stump, left lower extremity: Secondary | ICD-10-CM | POA: Diagnosis present

## 2021-11-05 DIAGNOSIS — E1169 Type 2 diabetes mellitus with other specified complication: Secondary | ICD-10-CM | POA: Diagnosis present

## 2021-11-05 DIAGNOSIS — F319 Bipolar disorder, unspecified: Secondary | ICD-10-CM | POA: Diagnosis present

## 2021-11-05 DIAGNOSIS — I38 Endocarditis, valve unspecified: Secondary | ICD-10-CM | POA: Diagnosis not present

## 2021-11-05 DIAGNOSIS — F419 Anxiety disorder, unspecified: Secondary | ICD-10-CM | POA: Diagnosis present

## 2021-11-05 DIAGNOSIS — I739 Peripheral vascular disease, unspecified: Secondary | ICD-10-CM

## 2021-11-05 DIAGNOSIS — I700 Atherosclerosis of aorta: Secondary | ICD-10-CM | POA: Diagnosis not present

## 2021-11-05 DIAGNOSIS — I70261 Atherosclerosis of native arteries of extremities with gangrene, right leg: Secondary | ICD-10-CM | POA: Diagnosis not present

## 2021-11-05 DIAGNOSIS — E1122 Type 2 diabetes mellitus with diabetic chronic kidney disease: Secondary | ICD-10-CM | POA: Diagnosis present

## 2021-11-05 DIAGNOSIS — M86172 Other acute osteomyelitis, left ankle and foot: Secondary | ICD-10-CM | POA: Diagnosis not present

## 2021-11-05 DIAGNOSIS — L97419 Non-pressure chronic ulcer of right heel and midfoot with unspecified severity: Secondary | ICD-10-CM | POA: Diagnosis present

## 2021-11-05 DIAGNOSIS — T8743 Infection of amputation stump, right lower extremity: Secondary | ICD-10-CM | POA: Diagnosis not present

## 2021-11-05 DIAGNOSIS — D62 Acute posthemorrhagic anemia: Secondary | ICD-10-CM | POA: Diagnosis present

## 2021-11-05 DIAGNOSIS — I130 Hypertensive heart and chronic kidney disease with heart failure and stage 1 through stage 4 chronic kidney disease, or unspecified chronic kidney disease: Secondary | ICD-10-CM | POA: Diagnosis present

## 2021-11-05 HISTORY — PX: I & D EXTREMITY: SHX5045

## 2021-11-05 HISTORY — PX: MINOR APPLICATION OF WOUND VAC: SHX6243

## 2021-11-05 LAB — CBC
HCT: 26.8 % — ABNORMAL LOW (ref 36.0–46.0)
Hemoglobin: 8 g/dL — ABNORMAL LOW (ref 12.0–15.0)
MCH: 24.1 pg — ABNORMAL LOW (ref 26.0–34.0)
MCHC: 29.9 g/dL — ABNORMAL LOW (ref 30.0–36.0)
MCV: 80.7 fL (ref 80.0–100.0)
Platelets: 301 10*3/uL (ref 150–400)
RBC: 3.32 MIL/uL — ABNORMAL LOW (ref 3.87–5.11)
RDW: 17.9 % — ABNORMAL HIGH (ref 11.5–15.5)
WBC: 10.6 10*3/uL — ABNORMAL HIGH (ref 4.0–10.5)
nRBC: 0 % (ref 0.0–0.2)

## 2021-11-05 LAB — COMPREHENSIVE METABOLIC PANEL
ALT: 9 U/L (ref 0–44)
AST: 11 U/L — ABNORMAL LOW (ref 15–41)
Albumin: 2 g/dL — ABNORMAL LOW (ref 3.5–5.0)
Alkaline Phosphatase: 145 U/L — ABNORMAL HIGH (ref 38–126)
Anion gap: 7 (ref 5–15)
BUN: 20 mg/dL (ref 6–20)
CO2: 24 mmol/L (ref 22–32)
Calcium: 8 mg/dL — ABNORMAL LOW (ref 8.9–10.3)
Chloride: 97 mmol/L — ABNORMAL LOW (ref 98–111)
Creatinine, Ser: 0.96 mg/dL (ref 0.44–1.00)
GFR, Estimated: >60 mL/min (ref >60)
Glucose, Bld: 237 mg/dL — ABNORMAL HIGH (ref 70–99)
Potassium: 4.5 mmol/L (ref 3.5–5.1)
Sodium: 128 mmol/L — ABNORMAL LOW (ref 135–145)
Total Bilirubin: 1.2 mg/dL (ref 0.3–1.2)
Total Protein: 8 g/dL (ref 6.5–8.1)

## 2021-11-05 LAB — GLUCOSE, CAPILLARY
Glucose-Capillary: 233 mg/dL — ABNORMAL HIGH (ref 70–99)
Glucose-Capillary: 232 mg/dL — ABNORMAL HIGH (ref 70–99)
Glucose-Capillary: 180 mg/dL — ABNORMAL HIGH (ref 70–99)
Glucose-Capillary: 182 mg/dL — ABNORMAL HIGH (ref 70–99)

## 2021-11-05 LAB — CK: Total CK: 31 U/L — ABNORMAL LOW (ref 38–234)

## 2021-11-05 LAB — VANCOMYCIN, RANDOM: Vancomycin Rm: 17

## 2021-11-05 SURGERY — IRRIGATION AND DEBRIDEMENT EXTREMITY
Anesthesia: General | Site: Leg Lower | Laterality: Left

## 2021-11-05 MED ORDER — PHENYLEPHRINE HCL (PRESSORS) 10 MG/ML IV SOLN
INTRAVENOUS | Status: DC | PRN
  Administered 2021-11-05 (×3): 160 ug via INTRAVENOUS
  Administered 2021-11-05: 80 ug via INTRAVENOUS

## 2021-11-05 MED ORDER — MIDAZOLAM HCL 2 MG/2ML IJ SOLN
INTRAMUSCULAR | Status: AC
  Filled 2021-11-05: qty 2

## 2021-11-05 MED ORDER — CHLORHEXIDINE GLUCONATE CLOTH 2 % EX PADS: 6.0000 | MEDICATED_PAD | Freq: Once | CUTANEOUS | Status: DC

## 2021-11-05 MED ORDER — OXYCODONE HCL 5 MG PO TABS
5.0000 mg | ORAL_TABLET | ORAL | Status: DC | PRN
  Administered 2021-11-05 – 2021-11-09 (×7): 5 mg via ORAL
  Filled 2021-11-05 (×9): qty 1

## 2021-11-05 MED ORDER — GLYCOPYRROLATE 0.2 MG/ML IJ SOLN
INTRAMUSCULAR | Status: DC | PRN
  Administered 2021-11-05: .2 mg via INTRAVENOUS

## 2021-11-05 MED ORDER — PANTOPRAZOLE SODIUM 40 MG IV SOLR
40.0000 mg | Freq: Two times a day (BID) | INTRAVENOUS | Status: DC
  Administered 2021-11-05 – 2021-11-09 (×10): 40 mg via INTRAVENOUS
  Filled 2021-11-05 (×12): qty 10

## 2021-11-05 MED ORDER — SODIUM CHLORIDE 0.9% IV SOLUTION: Freq: Once | INTRAVENOUS | Status: DC

## 2021-11-05 MED ORDER — ROSUVASTATIN CALCIUM 10 MG PO TABS
10.0000 mg | ORAL_TABLET | Freq: Every day | ORAL | Status: DC
  Administered 2021-11-05 – 2021-11-19 (×16): 10 mg via ORAL
  Filled 2021-11-05 (×16): qty 1

## 2021-11-05 MED ORDER — PHENYLEPHRINE 80 MCG/ML (10ML) SYRINGE FOR IV PUSH (FOR BLOOD PRESSURE SUPPORT)
PREFILLED_SYRINGE | INTRAVENOUS | Status: AC
  Filled 2021-11-05: qty 10

## 2021-11-05 MED ORDER — ACETAMINOPHEN 10 MG/ML IV SOLN
INTRAVENOUS | Status: AC
  Administered 2021-11-05: 1000 mg via INTRAVENOUS
  Filled 2021-11-05: qty 100

## 2021-11-05 MED ORDER — CHLORHEXIDINE GLUCONATE CLOTH 2 % EX PADS
6.0000 | MEDICATED_PAD | Freq: Every day | CUTANEOUS | Status: DC
  Administered 2021-11-05 – 2021-11-20 (×15): 6 via TOPICAL

## 2021-11-05 MED ORDER — CHLORHEXIDINE GLUCONATE CLOTH 2 % EX PADS
6.0000 | MEDICATED_PAD | Freq: Once | CUTANEOUS | Status: AC
  Administered 2021-11-05: 6 via TOPICAL

## 2021-11-05 MED ORDER — NICOTINE 14 MG/24HR TD PT24
14.0000 mg | MEDICATED_PATCH | Freq: Every day | TRANSDERMAL | Status: DC
  Administered 2021-11-06 – 2021-11-20 (×14): 14 mg via TRANSDERMAL
  Filled 2021-11-05 (×14): qty 1

## 2021-11-05 MED ORDER — PROPOFOL 10 MG/ML IV BOLUS
INTRAVENOUS | Status: DC | PRN
  Administered 2021-11-05: 20 mg via INTRAVENOUS

## 2021-11-05 MED ORDER — SODIUM CHLORIDE 0.9 % IV SOLN
2.0000 g | INTRAVENOUS | Status: DC
  Administered 2021-11-05 – 2021-11-06 (×2): 2 g via INTRAVENOUS
  Filled 2021-11-05: qty 2
  Filled 2021-11-05 (×2): qty 20

## 2021-11-05 MED ORDER — FENTANYL CITRATE (PF) 100 MCG/2ML IJ SOLN
INTRAMUSCULAR | Status: AC
  Filled 2021-11-05: qty 2

## 2021-11-05 MED ORDER — PROPOFOL 500 MG/50ML IV EMUL
INTRAVENOUS | Status: DC | PRN
  Administered 2021-11-05: 100 ug/kg/min via INTRAVENOUS

## 2021-11-05 MED ORDER — ASPIRIN EC 81 MG PO TBEC
81.0000 mg | DELAYED_RELEASE_TABLET | Freq: Every day | ORAL | Status: DC
  Administered 2021-11-05 – 2021-11-20 (×14): 81 mg via ORAL
  Filled 2021-11-05 (×16): qty 1

## 2021-11-05 MED ORDER — LIDOCAINE-EPINEPHRINE 1 %-1:100000 IJ SOLN
INTRAMUSCULAR | Status: DC | PRN
  Administered 2021-11-05: 10 mL

## 2021-11-05 MED ORDER — ACETAMINOPHEN 10 MG/ML IV SOLN: 1000.0000 mg | Freq: Once | INTRAVENOUS | Status: DC | PRN

## 2021-11-05 MED ORDER — ALPRAZOLAM 1 MG PO TABS
1.0000 mg | ORAL_TABLET | Freq: Four times a day (QID) | ORAL | Status: DC | PRN
  Administered 2021-11-05 – 2021-11-19 (×18): 1 mg via ORAL
  Filled 2021-11-05 (×19): qty 1

## 2021-11-05 MED ORDER — TRAZODONE HCL 100 MG PO TABS
200.0000 mg | ORAL_TABLET | Freq: Every day | ORAL | Status: DC
  Administered 2021-11-05: 200 mg via ORAL
  Administered 2021-11-06 – 2021-11-07 (×2): 100 mg via ORAL
  Administered 2021-11-08 – 2021-11-19 (×7): 200 mg via ORAL
  Filled 2021-11-05 (×15): qty 2

## 2021-11-05 MED ORDER — VANCOMYCIN HCL IN DEXTROSE 1-5 GM/200ML-% IV SOLN
1000.0000 mg | INTRAVENOUS | Status: AC
  Administered 2021-11-05: 1000 mg via INTRAVENOUS
  Filled 2021-11-05: qty 200

## 2021-11-05 MED ORDER — MOMETASONE FURO-FORMOTEROL FUM 100-5 MCG/ACT IN AERO
2.0000 | INHALATION_SPRAY | Freq: Two times a day (BID) | RESPIRATORY_TRACT | Status: DC
  Administered 2021-11-05 – 2021-11-20 (×27): 2 via RESPIRATORY_TRACT
  Filled 2021-11-05: qty 8.8

## 2021-11-05 MED ORDER — GLYCOPYRROLATE 0.2 MG/ML IJ SOLN
INTRAMUSCULAR | Status: AC
  Filled 2021-11-05: qty 1

## 2021-11-05 MED ORDER — ROPINIROLE HCL 1 MG PO TABS
1.0000 mg | ORAL_TABLET | Freq: Every day | ORAL | Status: DC
  Administered 2021-11-05 – 2021-11-19 (×16): 1 mg via ORAL
  Filled 2021-11-05 (×17): qty 1

## 2021-11-05 MED ORDER — FENTANYL CITRATE (PF) 100 MCG/2ML IJ SOLN: 25.0000 ug | INTRAMUSCULAR | Status: DC | PRN

## 2021-11-05 MED ORDER — FENTANYL CITRATE (PF) 100 MCG/2ML IJ SOLN
INTRAMUSCULAR | Status: DC | PRN
  Administered 2021-11-05: 25 ug via INTRAVENOUS
  Administered 2021-11-05: 50 ug via INTRAVENOUS
  Administered 2021-11-05: 25 ug via INTRAVENOUS

## 2021-11-05 MED ORDER — OXYCODONE HCL 5 MG PO TABS
10.0000 mg | ORAL_TABLET | ORAL | Status: DC | PRN
Start: 2021-11-05 — End: 2021-11-20
  Administered 2021-11-05 – 2021-11-20 (×24): 10 mg via ORAL
  Filled 2021-11-05 (×23): qty 2

## 2021-11-05 MED ORDER — DAPTOMYCIN 500 MG IV SOLR
500.0000 mg | Freq: Every day | INTRAVENOUS | Status: DC
  Administered 2021-11-06 – 2021-11-16 (×11): 500 mg via INTRAVENOUS
  Filled 2021-11-05 (×12): qty 10

## 2021-11-05 MED ORDER — VANCOMYCIN IV (FOR PTA / DISCHARGE USE ONLY): 1000.0000 mg | INTRAVENOUS | Status: DC

## 2021-11-05 MED ORDER — ONDANSETRON HCL 4 MG/2ML IJ SOLN: 4.0000 mg | Freq: Once | INTRAMUSCULAR | Status: DC | PRN

## 2021-11-05 MED ORDER — ALBUTEROL SULFATE (2.5 MG/3ML) 0.083% IN NEBU: 2.5000 mg | INHALATION_SOLUTION | Freq: Four times a day (QID) | RESPIRATORY_TRACT | Status: DC | PRN

## 2021-11-05 MED ORDER — PROPOFOL 10 MG/ML IV BOLUS
INTRAVENOUS | Status: AC
  Filled 2021-11-05: qty 20

## 2021-11-05 MED ORDER — 0.9 % SODIUM CHLORIDE (POUR BTL) OPTIME
TOPICAL | Status: DC | PRN
Start: 2021-11-05 — End: 2021-11-05
  Administered 2021-11-05: 500 mL

## 2021-11-05 MED ORDER — LIDOCAINE-EPINEPHRINE 1 %-1:100000 IJ SOLN
INTRAMUSCULAR | Status: AC
  Filled 2021-11-05: qty 1

## 2021-11-05 MED ORDER — LIDOCAINE HCL (PF) 2 % IJ SOLN
INTRAMUSCULAR | Status: AC
  Filled 2021-11-05: qty 5

## 2021-11-05 MED ORDER — MIDAZOLAM HCL 5 MG/5ML IJ SOLN
INTRAMUSCULAR | Status: DC | PRN
  Administered 2021-11-05: 2 mg via INTRAVENOUS

## 2021-11-05 MED ORDER — SPIRONOLACTONE 25 MG PO TABS
50.0000 mg | ORAL_TABLET | Freq: Every day | ORAL | Status: DC
  Administered 2021-11-05 – 2021-11-08 (×4): 50 mg via ORAL
  Filled 2021-11-05 (×5): qty 2

## 2021-11-05 MED ORDER — DEXMEDETOMIDINE HCL IN NACL 200 MCG/50ML IV SOLN
INTRAVENOUS | Status: DC | PRN
  Administered 2021-11-05: 12 ug via INTRAVENOUS

## 2021-11-05 MED ORDER — PREGABALIN 75 MG PO CAPS
300.0000 mg | ORAL_CAPSULE | Freq: Two times a day (BID) | ORAL | Status: DC
  Administered 2021-11-05 – 2021-11-20 (×30): 300 mg via ORAL
  Filled 2021-11-05 (×31): qty 4

## 2021-11-05 MED ORDER — BUSPIRONE HCL 15 MG PO TABS
30.0000 mg | ORAL_TABLET | Freq: Two times a day (BID) | ORAL | Status: DC
  Administered 2021-11-05 – 2021-11-20 (×30): 30 mg via ORAL
  Filled 2021-11-05 (×9): qty 2
  Filled 2021-11-05: qty 3
  Filled 2021-11-05 (×4): qty 2
  Filled 2021-11-05: qty 3
  Filled 2021-11-05 (×4): qty 2
  Filled 2021-11-05: qty 3
  Filled 2021-11-05 (×7): qty 2
  Filled 2021-11-05: qty 3
  Filled 2021-11-05 (×4): qty 2

## 2021-11-05 MED ORDER — METRONIDAZOLE 500 MG PO TABS
500.0000 mg | ORAL_TABLET | Freq: Two times a day (BID) | ORAL | Status: AC
  Administered 2021-11-05 – 2021-11-08 (×7): 500 mg via ORAL
  Filled 2021-11-05 (×7): qty 1

## 2021-11-05 MED ORDER — CITALOPRAM HYDROBROMIDE 10 MG PO TABS
20.0000 mg | ORAL_TABLET | Freq: Every day | ORAL | Status: DC
Start: 2021-11-05 — End: 2021-11-20
  Administered 2021-11-05 – 2021-11-20 (×13): 20 mg via ORAL
  Filled 2021-11-05 (×15): qty 2

## 2021-11-05 MED ORDER — SODIUM CHLORIDE 0.9 % IV SOLN: INTRAVENOUS | Status: DC | PRN

## 2021-11-05 SURGICAL SUPPLY — 23 items
BNDG CONFORM 2 STRL LF (GAUZE/BANDAGES/DRESSINGS) ×2 IMPLANT
BRUSH SCRUB EZ  4% CHG (MISCELLANEOUS)
BRUSH SCRUB EZ 4% CHG (MISCELLANEOUS) ×2 IMPLANT
CANISTER WOUND CARE 500ML ATS (WOUND CARE) ×3 IMPLANT
DRSG VAC ATS MED SENSATRAC (GAUZE/BANDAGES/DRESSINGS) ×1 IMPLANT
DURAPREP 26ML APPLICATOR (WOUND CARE) ×2 IMPLANT
ELECT REM PT RETURN 9FT ADLT (ELECTROSURGICAL) ×3
ELECTRODE REM PT RTRN 9FT ADLT (ELECTROSURGICAL) ×2 IMPLANT
GAUZE SPONGE 4X4 12PLY STRL (GAUZE/BANDAGES/DRESSINGS) ×3 IMPLANT
GLOVE SURG SYN 8.0 (GLOVE) ×3 IMPLANT
GLOVE SURG SYN 8.0 PF PI (GLOVE) ×2 IMPLANT
GOWN STRL REUS W/ TWL LRG LVL3 (GOWN DISPOSABLE) ×2 IMPLANT
GOWN STRL REUS W/ TWL XL LVL3 (GOWN DISPOSABLE) ×2 IMPLANT
GOWN STRL REUS W/TWL LRG LVL3 (GOWN DISPOSABLE) ×3
GOWN STRL REUS W/TWL XL LVL3 (GOWN DISPOSABLE) ×3
KIT TURNOVER KIT A (KITS) ×3 IMPLANT
MANIFOLD NEPTUNE II (INSTRUMENTS) ×3 IMPLANT
NS IRRIG 500ML POUR BTL (IV SOLUTION) ×3 IMPLANT
PACK EXTREMITY ARMC (MISCELLANEOUS) ×3 IMPLANT
PAD NEG PRESSURE SENSATRAC (MISCELLANEOUS) ×2 IMPLANT
STOCKINETTE 48X4 2 PLY STRL (GAUZE/BANDAGES/DRESSINGS) ×2 IMPLANT
STOCKINETTE STRL 4IN 9604848 (GAUZE/BANDAGES/DRESSINGS) ×3 IMPLANT
WATER STERILE IRR 500ML POUR (IV SOLUTION) ×3 IMPLANT

## 2021-11-05 NOTE — Assessment & Plan Note (Addendum)
?   Stable. ?? Last echo earlier this month done by dr Nehemiah Massed. ?? Cont her on aspirin 81, rosuvastatin, Aldactone. ?

## 2021-11-05 NOTE — Consult Note (Signed)
NAME: [name]  ?DOB: 13-Jun-1971  ?MRN: [id number]  ?Date/Time: 11/05/2021 1:43 PM ? ?REQUESTING PROVIDER: Dr.Alexander ?Subjective:  ?REASON FOR CONSULT: MRSA infection ??pt known to me from a recent hospitalization ?Chart reviewed -spoke to her partner at bedside. Pt a limited historian ?[name] is a 51 y.o. female with a history of diabetes mellitus, right breast carcinoma status postmastectomy on anastrozole, hypertension, nicotine dependence, COPD, history of CVA, history of MRSA skin abscesses, presents to the ED on 11/04/21 because of purulent discharge from the amputation site of left BKA.   ?Was admitted recently in the hospital between 10/07/2021 until 10/14/2021 for MRSA bacteremia and MRSA wounds of her left foot for which she underwent BKA on 10/11/21- Repeat BC from 10/09/21 was negative.She had TEE on 10/13/21 and it was negative for endocarditis.  She was discharged to SNF on 10/14/21 on vancomycin- She left SNF AMA the same day and came to th ED and was discharged home from ED after antibiotic arrangements were made for home infusion ?I was following her labs and hb was 6.9 last week and her partner had mentioned she was doing well when I had called to check on her symptoms- She was supposed to have had an appt with me this week but it was changed to a virtual visit on Tuesday which she did not attend and her son said that she was sleeping- She had another virtual visit yesterday and her partner was on the phone , and not patient- he mentioned that she had purulent drainage from the BKA site and he was seeing vascular at 1.30pm. They failed to keep that appt and instead showed up in the ED ?Vitals in the ED BP of 136/67, temperature 98.2, pulse 94, respiratory rate 18 and sats 99%.  WBC 9.6, Hb 6.6, platelet 320 and creatinine 1.12. ?She received blood transfusion in the ED.  She was started on vancomycin.  She was seen by vascular and today she is being taken for washout of the BKA stump.  I am asked  to see the patient for the same. ?Patient denies fever, chills, poor appetite, cough, shortness of breath.  She is feeling very weak and sleeps all the time.  She has lost weight over the past few months.  She is basically in the bed all the time.  She has wounds on the right leg and the right heel  ? ?Past Medical History:  ?Diagnosis Date  ? Anxiety   ? takes Prozac daily  ? Anxiety   ? Aortic valve calcification   ? Asthma   ? Advair and Spirva daily  ? Asthma   ? Bipolar disorder ([place])   ? Breast cancer, right breast ([place]) 12/23/2019  ? CAD (coronary artery disease)   ? a. LHC 11/2013 done for CP/fluid retention: mild disease in prox LAD, mild-mod disease in mRCA, EF 60% with normal LVEDP. b. Normal nuc 03/2016.  ? Cancer [facility])   ? Cellulitis and abscess of trunk 03/12/2020  ? CHF (congestive heart failure) ([place])   ? Chronic diastolic CHF (congestive heart failure) ([place])   ? Chronic heart failure with preserved ejection fraction ([place]) 11/16/2018  ? CKD (chronic kidney disease), stage II   ? COPD (chronic obstructive pulmonary disease) ([place])   ? a. nocturnal O2.  ? COPD (chronic obstructive pulmonary disease) ([place])   ? Coronary artery disease   ? Decreased urine stream   ? Diabetes mellitus   ? Diabetes mellitus without complication ([place])   ?  Dyspnea   ? Elevated C-reactive protein (CRP) 01/29/2018  ? Elevated sedimentation rate 01/29/2018  ? Elevated serum hCG 01/19/2018  ? Family history of adverse reaction to anesthesia   ? mom gets nauseated  ? Foot pain, bilateral 05/22/2018  ? GERD (gastroesophageal reflux disease)   ? takes Pepcid daily  ? History of blood clots   ? left leg 3-47yr ago  ? Hyperlipidemia   ? Hypertension   ? Hypertriglyceridemia   ? Inguinal hernia, left 01/2015  ? Muscle spasm   ? Open wound of genital labia   ? Peripheral neuropathy   ? RBBB   ? Seizures ([place]   ? Sepsis ([place] 01/19/2018  ? Sinus tachycardia   ? a. persistent since 2009.  ? Smokers' cough ([place]   ? Status post mastectomy, right  01/14/2020  ? Stroke ([facility] 1989  ? left sided weakness  ? TIA (transient ischemic attack)   ? Tobacco abuse   ? Vulvar abscess 01/23/2018  ?  ?Past Surgical History:  ?Procedure Laterality Date  ? AMPUTATION Left 10/11/2021  ? Procedure: AMPUTATION BELOW KNEE;  Surgeon: DAlgernon Huxley MD;  Location: [facility];  Service: General;  Laterality: Left;  ? APPLICATION OF WOUND VAC Right 01/29/2020  ? Procedure: APPLICATION OF WOUND VAC;  Surgeon: RRonny Bacon MD;  Location: [facility];  Service: General;  Laterality: Right;  ? BREAST BIOPSY Right 11/06/2019  ? u[place]oreacore path pending venus clip  ? GRAFT APPLICATION  34/91/7915 ? Procedure: GRAFT APPLICATION;  Surgeon: EEdrick Kins DPM;  Location: [facility];  Service: Podiatry;;  ? HERNIA REPAIR Left   ? INCISION AND DRAINAGE Bilateral 09/21/2021  ? Procedure: INCISION AND DRAINAGE;  Surgeon: EEdrick Kins DPM;  Location: [facility];  Service: Podiatry;  Laterality: Bilateral;  ? INCISION AND DRAINAGE ABSCESS N/A 01/26/2018  ? Procedure: INCISION AND DEBRIDEMENT OF VULVAR NECROTIZING SOFT TISSUE INFECTION;  Surgeon: WGreer Pickerel MD;  [place]  Service: General;  Laterality: N/A;  ? INCISION AND DRAINAGE ABSCESS N/A 07/21/2021  ? Procedure: INCISION AND DRAINAGE ABSCESS Scalp;  Surgeon: BRobert Bellow MD;  Location: [facility];  Service: General;  Laterality: N/A;  scalp and right abdomen  ? INCISION AND DRAINAGE PERIRECTAL ABSCESS N/A 01/22/2018  ? Procedure: IRRIGATION AND DEBRIDEMENT LABIAL/VULVAR AREA;  Surgeon: BCoralie Keens MD;  [place]  Service: General;  Laterality: N/A;  ? [place] 01/29/2018  ? Procedure: IRRIGATION AND DEBRIDEMENT VULVA;  Surgeon: HExcell Seltzer MD;  [place]  Service: General;  Laterality: N/A;  ? INGUINAL HERNIA REPAIR Left 04/08/2015  ? Procedure: OPEN LEFT INGUINAL HERNIA REPAIR WITH MESH;  Surgeon: ARalene Ok MD;  [place]  Service: General;  Laterality: Left;  ?  INSERTION OF MESH Left 04/08/2015  ? Procedure: INSERTION OF MESH;  Surgeon: ARalene Ok MD;  [place]  Service: General;  Laterality: Left;  ? IRRIGATION AND DEBRIDEMENT ABSCESS N/A 07/21/2021  ? Procedure: IRRIGATION AND DEBRIDEMENT ABSCESS abdomen;  Surgeon: BRobert Bellow MD;  [facility];  Service: General;  Laterality: N/A;  ? LAPAROSCOPY    ? Endometriosis  ? LEFT HEART CATHETERIZATION WITH CORONARY ANGIOGRAM N/A 12/05/2013  ? Procedure: LEFT HEART CATHETERIZATION WITH CORONARY ANGIOGRAM;  Surgeon: JJettie Booze MD;  [facility];  Service: Cardiovascular;  Laterality: N/A;  ? LOWER EXTREMITY ANGIOGRAPHY Left 09/22/2021  ? Procedure: Lower Extremity Angiography;  Surgeon: SKatha Cabal MD;  Location: [place] [facility];  Service: Cardiovascular;  Laterality: Left;  ? MASTECTOMY Right   ? right kidney drained    ? SIMPLE MASTECTOMY WITH AXILLARY SENTINEL NODE BIOPSY Right 12/23/2019  ? Procedure: Right SIMPLE MASTECTOMY WITH AXILLARY SENTINEL NODE BIOPSY;  Surgeon: Ronny Bacon, MD;  Location: [facility];  Service: General;  Laterality: Right;  ? TEE WITHOUT CARDIOVERSION N/A 11/28/2013  ? Procedure: TRANSESOPHAGEAL ECHOCARDIOGRAM (TEE);  Surgeon: Thayer Headings, MD;  Location: [place];  Service: Cardiovascular;  Laterality: N/A;  ? TEE WITHOUT CARDIOVERSION N/A 10/13/2021  ? Procedure: TRANSESOPHAGEAL ECHOCARDIOGRAM (TEE);  Surgeon: Corey Skains, MD;  Location: [facility];  Service: Cardiovascular;  Laterality: N/A;  ? WOUND DEBRIDEMENT Right 01/29/2020  ? Procedure: DEBRIDEMENT WOUND, Excisional debridement skin, subcutaneous and muscle right chest wall;  Surgeon: Ronny Bacon, MD;  Location: [facility];  Service: General;  Laterality: Right;  ?  ?Social History  ? ?Socioeconomic History  ? Marital status: Married  ?  Spouse name: Not on file  ? Number of children: 2  ? Years of education: Not on file  ? Highest education level: Not on file  ?Occupational  History  ? Not on file  ?Tobacco Use  ? Smoking status: Every Day  ?  Packs/day: 0.25  ?  Years: 36.00  ?  Pack years: 9.00  ?  Types: Cigarettes  ?  Start date: 07/11/1981  ? Smokeless tobacco: Never  ? Tobac

## 2021-11-05 NOTE — Assessment & Plan Note (Addendum)
?   Diabetic ulcer right heel, vascular surgery planning for angiography RLE 11/09/2021 ?

## 2021-11-05 NOTE — Progress Notes (Signed)
Inpatient Diabetes Program Recommendations ? ?AACE/ADA: New Consensus Statement on Inpatient Glycemic Control (2015) ? ?Target Ranges:  Prepandial:   less than 140 mg/dL ?     Peak postprandial:   less than 180 mg/dL (1-2 hours) ?     Critically ill patients:  140 - 180 mg/dL  ? ? Latest Reference Range & Units 09/21/21 03:10  ?Hemoglobin A1C 4.8 - 5.6 % 10.1 (H)  ?(H): Data is abnormally high ? Latest Reference Range & Units 11/04/21 22:36 11/05/21 08:48  ?Glucose-Capillary 70 - 99 mg/dL 291 (H) ? ?8 units Novolog ? 233 (H) ? ?5 units Novolog ?  ?(H): Data is abnormally high ? ? ?Admit with: Right foot and left bka wound with drainage ? ?History: DM, CHF, CKD ? ?Home DM Meds: Lantus 35 units Daily ?       Novolog 1-9 units QID per SSI ?       Synjardy 25/1000 mg daily ? ?Current Orders: Novolog Moderate Correction Scale/ SSI (0-15 units) TID AC  ? ? ?Counseled by the Diabetes RN during previous admission 09/21/2021 ?Used to take U500 Insulin ?ENDO: Dr. Honor Junes with [facility]--last seen 12/03/2020 ? ? ?MD- Note patient taking Lantus insulin at home.  CBG 233 this AM ? ?Please consider starting Semglee 25 units Daily (75% total home dose) ? ?Please start today ? ? ?--Will follow patient during hospitalization-- ? ?Wyn Quaker RN, MSN, CDE ?Diabetes Coordinator ?Inpatient Glycemic Control Team ?Team Pager: [phone] (8a-5p) ? ?

## 2021-11-05 NOTE — Assessment & Plan Note (Addendum)
?   Likely due to surgical blood loss ?? Patient is status posttransfusion 2 units PRBC on this hospitalization ?? Monitor CBC, platelets particularly in light of new rash  ? ?

## 2021-11-05 NOTE — Assessment & Plan Note (Addendum)
?   Continue slow IV fluids ? ?

## 2021-11-05 NOTE — Consult Note (Signed)
WOC Nurse Consult Note: ?Patient receiving care in [facility]. Spouse present at time of my assessment. ?Reason for Consult: right foot and left bka wound with drainage ?Wound type: ?Per the xray results of the stump from 11/04/21 at 1610: "IMPRESSION: ?Postsurgical changes of recent below-knee amputation with soft tissue swelling of the distal stump and underlying soft tissue gas." ? ?Per the xray results of the right foot from 11/04/21 at 16:10: "IMPRESSION: ?Tongue-type posterior calcaneus fracture with severe displacement and extension of the fracture line towards the posterior subtalar ?joint. Evidence of adjacent posterior soft tissue wound. Diffuse right foot soft tissue swelling." ? ?Over the left posterior heel and achilles area that is black with surrounding purple discoloration, there is a highly necrotic area of eschar that measures 7 cm x 3 cm.  The spouse tells me it "popped up" a couple of days ago and he has been putting Santyl on the right heel and left stump eschars. ? ?I have explained to Dr. Emeterio Reeve, attending, that these conditions exceed the scope of practice of the [facility] nurse and to please notify Dr. Lucky Cowboy, Vascular Surgeon having performed the L BKA 10/11/21, of his patient's arrival and conditions.  This communication was completed via [place]. She responds she will do so. ?Pressure Injury POA: Yes/No/NA ?Measurement: ?Wound bed: ?Drainage (amount, consistency, odor)  ?Periwound: ?Dressing procedure/placement/frequency: ?No topical therapy was ordered. Topical therapy will not solve the underlying issues. ?WOC is signing off. Please re-consult if needed. ? ?Val Riles, RN, MSN, CWOCN, CNS-BC, pager [phone]  ?
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
## 2021-11-05 NOTE — Progress Notes (Signed)
?PROGRESS NOTE ? ? ? ?[name]  FIE:[id number] DOB: 1970-09-08 \ ?DOA: 11/04/2021 ?PCP: Pleas Koch, NP ?Date of service: 11/05/21 today ? ? ? ?Brief Narrative:  ?[name] is a 51 year old Caucasian female with PMH CHF, CAD, DM 2, tobacco use, presenting with concern for wound infection on surgical site, she is status post BKA on 10/11/2021, currently being followed at home by wound care nurse.  Patient presented to the ED 11/04/2021 reports purulent drainage, warmth, pain at surgical site, on left stump, as well as wound on right heel.  Admitted for IV antibiotics and surgical consult for treatment of cellulitis of lower extremity.  Of note, last echocardiogram earlier this month showed LVEF 55 to 60%. ?Today 11/05/21: Seen by vascular, agree with concern for wound infection particularly for possibility of underlying abscess, planning to take patient to the OR for debridement and possible wound VAC placement, also recommending infectious disease consult.  For diabetic ulcer on right heel, progressing toward ankle, planning for angiogram on Tuesday 11/09/21.  infectious disease consulted ? ? ? ?Assessment & Plan: ?  ?Principal Problem: ?  Cellulitis of lower extremity ?Active Problems: ?  Cellulitis ?  Uncontrolled type 2 diabetes mellitus with hyperglycemia, with long-term current use of insulin ([place]) ?  Chronic diastolic CHF (congestive heart failure) ([place]) ?  Hyponatremia ?  Tobacco abuse ?  Chronic kidney disease, stage 3a ([place]) ?  Epilepsy, unspecified, not intractable, without status epilepticus ([place]) ?  Anemia ? ? ?Cellulitis of lower extremity ?Continue current IV antibiotics with disease is consulted, appreciate their recommendations as well ?Plan for irrigation/debridement today in OR with vascular ? ?Uncontrolled type 2 diabetes mellitus with hyperglycemia, with long-term current use of insulin ([place]) ?SSI/ glycemic protocol. ?Home regimen of synjardy held due to abnormal creatinine and prevention  of hypoglycemia. ? ? ?Chronic diastolic CHF (congestive heart failure) ([place]) ?Stable. ?Last echo earlier this month done by dr Nehemiah Massed. ?Cont her on aspirin 81, rosuvastatin, Aldactone. ? ?Hyponatremia ? ?  Latest Ref Rng & Units 11/04/2021  ?  3:24 PM 10/15/2021  ?  9:54 AM 10/14/2021  ?  5:48 AM  ?BMP  ?Glucose 70 - 99 mg/dL 329   227     ?BUN 6 - 20 mg/dL 19   21     ?Creatinine 0.44 - 1.00 mg/dL 1.12   0.91   0.89    ?Sodium 135 - 145 mmol/L 129   134     ?Potassium 3.5 - 5.1 mmol/L 4.8   4.6     ?Chloride 98 - 111 mmol/L 96   99     ?CO2 22 - 32 mmol/L 25   28     ?Calcium 8.9 - 10.3 mg/dL 8.2   8.1     ?Attribute patient's hyponatremia to combination of decreased p.o. intake, dehydration, natriuretic medication.  Encourage p.o. intake.  We will hold patient's Synjardy. ?Maintenance rate normal saline at 20 cc the next 2 days. ? ? ?Tobacco abuse ?Nicotine patch. ? ?Chronic kidney disease, stage 3a ([place]) ?Lab Results  ?Component Value Date  ? CREATININE 1.12 (H) 11/04/2021  ? CREATININE 0.91 10/15/2021  ? CREATININE 0.89 10/14/2021  ?will hold synjardy.  ?Avoid contrast and renally dose needed med. ? ?Epilepsy, unspecified, not intractable, without status epilepticus ([place]) ?Pt is not on AED's. ?Aspiration and seizure precaution.  ? ?Anemia ? ?  Latest Ref Rng & Units 11/04/2021  ? 11:15 PM 11/04/2021  ?  3:24 PM 10/15/2021  ?  9:54  AM  ?CBC  ?WBC 4.0 - 10.5 K/uL 9.6   11.1   13.1    ?Hemoglobin 12.0 - 15.0 g/dL 6.6   7.8   7.3    ?Hematocrit 36.0 - 46.0 % 22.4   27.3   24.1    ?Platelets 150 - 400 K/uL 320   367   258    ?we will transfuse and monitor two units. ?Occult. ?Iv ppi. ?Anemia panel suspect combination of surgical blood loss , IDA and ACD. ? ? ?PVD (peripheral vascular disease) ([place]) ?Diabetic ulcer right heel, vascular surgery planning for angiography next week ? ? ? ?DVT prophylaxis: Heparin ?Code Status: Full code ?Family Communication: Husband is at bedside ?Disposition Plan: Pending surgical  interventions, angiography, see assessment/plan above.  Likely patient will need SNF. ? ? ?Consultants:  ?Vascular surgery ?Infectious disease ? ?Procedures: ?Irrigation and debridement of left below-knee stump, 11/05/2021 ? ? ? ? ? ? ? ? ? ? ? ? ? ? ? ? ? ? ?Antimicrobials:  ?Anti-infectives (From admission, onward)  ? ? Start     Dose/Rate Route Frequency Ordered Stop  ? 11/05/21 1100  vancomycin (VANCOCIN) IVPB 1000 mg/200 mL premix       ? 1,000 mg ?200 mL/hr over 60 Minutes Intravenous On call to O.R. 11/05/21 1013 11/06/21 0559  ? 11/05/21 0130  vancomycin IVPB  Status:  Discontinued       ?Note to Pharmacy: Indication:  MRSA bacteremia ?First Dose: Yes ?Last Day of Therapy:  11/07/2021 ?Labs - Monday:  CBC/D, CMP, and vancomycin trough.  ?Labs - Thursday:  BMP and vancomycin trough  ?Please pull PIC at completion of IV antibiotics  ?Fax labs to [phone] ?Method of administration:Elastomeric ?Metho  ? 1,000 mg Intravenous Every 24 hours 11/05/21 0036 11/05/21 0040  ? 11/04/21 2015  vancomycin (VANCOCIN) IVPB 1000 mg/200 mL premix  Status:  Discontinued       ? 1,000 mg ?200 mL/hr over 60 Minutes Intravenous  Once 11/04/21 2014 11/04/21 2014  ? 11/04/21 1815  vancomycin (VANCOCIN) IVPB 1000 mg/200 mL premix       ? 1,000 mg ?200 mL/hr over 60 Minutes Intravenous  Once 11/04/21 1806 11/04/21 1944  ? ?  ? ? ? ?Subjective: ?Patient seen and examined resting comfortably in bed on hospital floor, husband is present at bedside, patient is somewhat drowsy, husband states that she has been a bit "out of it" since she has gotten her pain medications.  He confirms the above history which was obtained from the medical record.  Patient denies chest pain, shortness of breath,, states pain is well controlled and leg ? ?Objective: ?Vitals:  ? 11/05/21 2993 11/05/21 7169 11/05/21 6789 11/05/21 0835  ?BP: (!) 111/100 (!) 111/59 (!) 118/59 125/62  ?Pulse: 77 77 81 81  ?Resp:   16 15  ?Temp: 98.4 ?F (36.9 ?C)  98.3 ?F (36.8  ?C) 98.5 ?F (36.9 ?C)  ?TempSrc: Oral  Oral Oral  ?SpO2: 100% 100% 99% 97%  ?Weight:      ?Height:      ? ? ?Intake/Output Summary (Last 24 hours) at 11/05/2021 1416 ?Last data filed at 11/05/2021 1126 ?Gross per 24 hour  ?Intake 340 ml  ?Output 1100 ml  ?Net -760 ml  ? ?Filed Weights  ? 11/04/21 1501  ?Weight: 98 kg  ? ? ?Examination: ? ?General exam: Appears calm and comfortable if somewhat somnolent, husband states that she has been drowsy ever since getting her pain medications, she is  responsive but minimally conversational ?Respiratory system: Clear to auscultation. Respiratory effort normal. ?Cardiovascular system: S1 & S2 heard, RRR. No JVD, murmurs, rubs, gallops or clicks. ?Gastrointestinal system: Abdomen is nondistended, soft and nontender.  ?Central nervous system: Alert and oriented but drowsy. No focal neurological deficits. ?Extremities: Symmetric 5 x 5 power. ?Skin: Dressings were in place and were not disturbed, vascular is following ? ? ? ? ?Data Reviewed: I have personally reviewed following labs and imaging studies ? ?CBC: ?Recent Labs  ?Lab 11/04/21 ?1524 11/04/21 ?2315 11/05/21 ?0947  ?WBC 11.1* 9.6 10.6*  ?NEUTROABS 9.0*  --   --   ?HGB 7.8* 6.6* 8.0*  ?HCT 27.3* 22.4* 26.8*  ?MCV 83.2 80.3 80.7  ?PLT 367 320 301  ? ?Basic Metabolic Panel: ?Recent Labs  ?Lab 11/04/21 ?1524 11/05/21 ?0947  ?NA 129* 128*  ?K 4.8 4.5  ?CL 96* 97*  ?CO2 25 24  ?GLUCOSE 329* 237*  ?BUN 19 20  ?CREATININE 1.12* 0.96  ?CALCIUM 8.2* 8.0*  ? ?GFR: ?Estimated Creatinine Clearance: 77.9 mL/min (by C-G formula based on SCr of 0.96 mg/dL). ?Liver Function Tests: ?Recent Labs  ?Lab 11/04/21 ?1524 11/05/21 ?0947  ?AST 18 11*  ?ALT 9 9  ?ALKPHOS 183* 145*  ?BILITOT 0.9 1.2  ?PROT 9.2* 8.0  ?ALBUMIN 2.3* 2.0*  ? ?No results for input(s): LIPASE, AMYLASE in the last 168 hours. ?No results for input(s): AMMONIA in the last 168 hours. ?Coagulation Profile: ?Recent Labs  ?Lab 11/04/21 ?1820  ?INR 1.4*  ? ?Cardiac Enzymes: ?Recent  Labs  ?Lab 11/05/21 ?0947  ?CKTOTAL 31*  ? ?BNP (last 3 results) ?Recent Labs  ?  12/01/20 ?1250  ?PROBNP 78.0  ? ?HbA1C: ?No results for input(s): HGBA1C in the last 72 hours. ?CBG: ?Recent Labs  ?Lab 11/04/21 ?2

## 2021-11-05 NOTE — Anesthesia Preprocedure Evaluation (Signed)
Anesthesia Evaluation  ?Patient identified by MRN, date of birth, ID band ?Patient awake ? ? ? ?Reviewed: ?Allergy & Precautions, H&P , NPO status , Patient's Chart, lab work & pertinent test results, reviewed documented beta blocker date and time  ? ?History of Anesthesia Complications ?Negative for: history of anesthetic complications ? ?Airway ?Mallampati: II ? ?TM Distance: >3 FB ?Neck ROM: full ? ? ? Dental ? ?(+) Edentulous Upper, Edentulous Lower ?  ?Pulmonary ?shortness of breath and with exertion, asthma , sleep apnea and Continuous Positive Airway Pressure Ventilation , COPD,  COPD inhaler and oxygen dependent, Current Smoker and Patient abstained from smoking.,  ?  ? ?+ decreased breath sounds ? ? ? ? ? Cardiovascular ?Exercise Tolerance: Poor ?hypertension, On Medications ?+ CAD, + Peripheral Vascular Disease, +CHF and + Orthopnea  ?Normal cardiovascular exam+ dysrhythmias  ?Rhythm:Regular Rate:Normal ? ? ?  ?Neuro/Psych ? Headaches, Seizures -,  PSYCHIATRIC DISORDERS Anxiety Depression Bipolar Disorder TIA Neuromuscular disease CVA, Residual Symptoms   ? GI/Hepatic ?Neg liver ROS, GERD  Medicated,  ?Endo/Other  ?diabetes ? Renal/GU ?Renal disease  ?negative genitourinary ?  ?Musculoskeletal ? ? Abdominal ?  ?Peds ? Hematology ?negative hematology ROS ?(+)   ?Anesthesia Other Findings ?Past Medical History: ?No date: Anxiety ?    Comment:  takes Prozac daily ?No date: Anxiety ?No date: Aortic valve calcification ?No date: Asthma ?    Comment:  Advair and Spirva daily ?No date: Asthma ?No date: Bipolar disorder ([place]) ?12/23/2019: Breast cancer, right breast ([place]) ?No date: CAD (coronary artery disease) ?    Comment:  a. LHC 11/2013 done for CP/fluid retention: mild disease  ?             in prox LAD, mild-mod disease in mRCA, EF 60% with normal ?             LVEDP. b. Normal nuc 03/2016. ?No date: Cancer [facility]) ?03/12/2020: Cellulitis and abscess of trunk ?No date: CHF  (congestive heart failure) ([place]) ?No date: Chronic diastolic CHF (congestive heart failure) ([place]) ?11/16/2018: Chronic heart failure with preserved ejection fraction ([place]) ?No date: CKD (chronic kidney disease), stage II ?No date: COPD (chronic obstructive pulmonary disease) ([place]) ?    Comment:  a. nocturnal O2. ?No date: COPD (chronic obstructive pulmonary disease) ([place]) ?No date: Coronary artery disease ?No date: Decreased urine stream ?No date: Diabetes mellitus ?No date: Diabetes mellitus without complication ([place]) ?No date: Dyspnea ?01/29/2018: Elevated C-reactive protein (CRP) ?01/29/2018: Elevated sedimentation rate ?01/19/2018: Elevated serum hCG ?No date: Family history of adverse reaction to anesthesia ?    Comment:  mom gets nauseated ?05/22/2018: Foot pain, bilateral ?No date: GERD (gastroesophageal reflux disease) ?    Comment:  takes Pepcid daily ?No date: History of blood clots ?    Comment:  left leg 3-88yr ago ?No date: Hyperlipidemia ?No date: Hypertension ?No date: Hypertriglyceridemia ?01/2015: Inguinal hernia, left ?No date: Muscle spasm ?No date: Open wound of genital labia ?No date: Peripheral neuropathy ?No date: RBBB ?No date: Seizures ([place] ?01/19/2018: Sepsis ([place] ?No date: Sinus tachycardia ?    Comment:  a. persistent since 2009. ?No date: Smokers' cough ([place] ?01/14/2020: Status post mastectomy, right ?1989: Stroke ([place] ?    Comment:  left sided weakness ?No date: TIA (transient ischemic attack) ?No date: Tobacco abuse ?01/23/2018: Vulvar abscess ? ? Reproductive/Obstetrics ?negative OB ROS ? ?  ? ? ? ? ? ? ? ? ? ? ? ? ? ?  ?  ? ? ? ? ? ? ? ? ?  Anesthesia Physical ? ?Anesthesia Plan ? ?ASA: 4 ? ?Anesthesia Plan: General  ? ?Post-op Pain Management: Ofirmev IV (intra-op)*  ? ?Induction: Intravenous ? ?PONV Risk Score and Plan: 4 or greater and Ondansetron, Dexamethasone, Midazolam, Treatment may vary due to age or medical condition, TIVA and Propofol infusion ? ?Airway Management Planned:  Natural Airway and LMA ? ?Additional Equipment: None ? ?Intra-op Plan:  ? ?Post-operative Plan: Extubation in OR ? ?Informed Consent: I have reviewed the patients History and Physical, chart, labs and discussed the procedure including the risks, benefits and alternatives for the proposed anesthesia with the patient or authorized representative who has indicated his/her understanding and acceptance.  ? ?Patient has DNR.  ?Discussed DNR with patient and Suspend DNR. ?  ?Dental advisory given ? ?Plan Discussed with: CRNA ? ?Anesthesia Plan Comments: (Discussed risks of anesthesia with patient, including PONV, sore throat, lip/dental/eye damage. Rare risks discussed as well, such as cardiorespiratory and neurological sequelae, and allergic reactions. Discussed the role of CRNA in patient's perioperative care. Patient understands. ?Patient counseled on being higher risk for anesthesia due to comorbidities: o2-dependent COPD. Patient was told about increased risk of cardiac and respiratory events, including death. ?)  ? ? ? ? ? ? ?Anesthesia Quick Evaluation ? ?

## 2021-11-05 NOTE — Progress Notes (Signed)
? ?  This pt is currently enrolled in our [facility] program. This is a home based Palliative care program provided by [facility]. She has been receiving services of nursing 1-3 times a month and SW visits.  Her primary diagnosis for [place] is the HTN heart and chronic kidney disease. She is eligible to have [facility] services with this program in place.  ? ?We will follow while pt is in hospital and assist with d/c plan. Webb Silversmith RN [phone]  ?

## 2021-11-05 NOTE — Assessment & Plan Note (Addendum)
?   Pt is not on AED's. ?? Aspiration and seizure precaution.  ?

## 2021-11-05 NOTE — Assessment & Plan Note (Addendum)
-  Nicotine patch 

## 2021-11-05 NOTE — Assessment & Plan Note (Addendum)
?   SSI/ glycemic protocol. ?? Home regimen of synjardy held due to abnormal creatinine and prevention of hypoglycemia. ? ?

## 2021-11-05 NOTE — Op Note (Addendum)
? ? ?  OPERATIVE NOTE ? ? ?PROCEDURE: ?1.  Excisional debridement left below-knee amputation stump including skin subcutaneous tissues muscle and bone. ?2.  Application of a wound VAC ? ?PRE-OPERATIVE DIAGNOSIS: Abscess associated with necrotic tissue left below-knee amputation stump ? ?POST-OPERATIVE DIAGNOSIS: Same ? ?SURGEON: [name] ? ?ASSISTANT(S): None ? ?ANESTHESIA: general ? ?ESTIMATED BLOOD LOSS: 75 cc ? ?FINDING(S): ?Necrotic tissue including skin subcutaneous tissues muscle with bone exposed in the base of the wound ? ?SPECIMEN(S): Necrotic tissue with segments of fibula ? ?INDICATIONS:   ?[name] is a 51 y.o. female who presents with draining infection of the left below-knee amputation stump.  Risk and benefits for excisional debridement were reviewed with the patient all questions were answered patient has agreed to proceed. ? ?DESCRIPTION: ?After full informed written consent was obtained from the patient, the patient was brought back to the operating room and placed supine upon the operating table.  Prior to induction, the patient received IV antibiotics.   After obtaining adequate anesthesia, the patient was then prepped and draped in the standard fashion for a excisional debridement of the below-knee amputation stump. ? ?Hemostat was then used to remove two thirds of the staples beginning laterally and working toward the medial aspect.  Upon removing staples the skin opened and several segments there was also associated necrotic skin at the most lateral aspect.  I then infiltrated 1% lidocaine with epinephrine into the skin and soft tissue surrounding this area. ? ?A 15 blade scalpel was then used to ellipse a margin of skin including all of the subcutaneous tissues down to the fascia.  Fascia was not intact in several locations and appeared to be nonviable.  I then used Mayo scissors to debride fascia and muscle.  I probed the deepest cavity digitally and encountered pus.  This was  cultured.  A second culture more medially was also taken although there was not any frank pus just nonviable tissue in this area.  At this point identified the distal end of the fibula was exposed in this wound.  I dissected the fibula more proximally for a distance of approximately 2-1/2 cm and then used bone shears to transect the fibula at this location. ? ?Debrided tissue and bone was passed off the field for permanent specimen. ? ?Hemostasis was then obtained with Bovie cautery and direct pressure as needed.  The wound was then irrigated with a liter and a half of saline. ? ?The wound was then measured and noted to be 15 centimeters in length 8 centimeters in width and 6 centimeters in depth.  (The addendum created Nov 15, 2021 adjusted the measurement from millimeters to cm) ? ?A black VAC sponge was then cut to the appropriate shape and applied obtaining an airtight seal.  VAC suction was set to 125 mmHg continuous. ?  ?The patient tolerated this procedure well.  ? ?COMPLICATIONS: None ? ?CONDITION: Good ? ? ?[name] ?[facility]  ?Office: [phone] ? ? ?11/05/2021, 7:50 PM ? ? ?  ?

## 2021-11-05 NOTE — Interval H&P Note (Signed)
History and Physical Interval Note: ? ?11/05/2021 ?6:25 PM ? ?[name]  has presented today for surgery, with the diagnosis of left below knee stump infection.  The various methods of treatment have been discussed with the patient and family. After consideration of risks, benefits and other options for treatment, the patient has consented to  Procedure(s): ?IRRIGATION AND DEBRIDEMENT LEFT BELOW KNEE STUMP (Left) as a surgical intervention.  The patient's history has been reviewed, patient examined, no change in status, stable for surgery.  I have reviewed the patient's chart and labs.  Questions were answered to the patient's satisfaction.   ? ? ?[name] ? ? ?

## 2021-11-05 NOTE — Anesthesia Postprocedure Evaluation (Signed)
Anesthesia Post Note ? ?Patient: Zadia Cayton ? ?Procedure(s) Performed: IRRIGATION AND DEBRIDEMENT LEFT BELOW KNEE STUMP (Left) ?MINOR APPLICATION OF WOUND VAC (Left: Leg Lower) ? ?Patient location during evaluation: PACU ?Anesthesia Type: General ?Level of consciousness: awake and alert ?Pain management: pain level controlled ?Vital Signs Assessment: post-procedure vital signs reviewed and stable ?Respiratory status: spontaneous breathing, nonlabored ventilation, respiratory function stable and patient connected to nasal cannula oxygen ?Cardiovascular status: blood pressure returned to baseline and stable ?Postop Assessment: no apparent nausea or vomiting ?Anesthetic complications: no ? ? ?No notable events documented. ? ? ?Last Vitals:  ?Vitals:  ? 11/05/21 2030 11/05/21 2243  ?BP: 128/74 131/71  ?Pulse: 71 76  ?Resp: 15 18  ?Temp: 36.6 ?C   ?SpO2: 96% 100%  ?  ?Last Pain:  ?Vitals:  ? 11/05/21 2051  ?TempSrc:   ?PainSc: 0-No pain  ? ? ?  ?  ?  ?  ?  ?  ? ?Arita Miss ? ? ? ? ?
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
## 2021-11-05 NOTE — Progress Notes (Signed)
Pharmacy Antibiotic Note ? ?[name] is a 51 y.o. female admitted on 11/04/2021.  Pharmacy has been consulted for vancomycin dosing. Pt on 1 g IV vancomycin PTA for MRSA bacteremia - was scheduled to continue until 11/07/21. Med rec tech confirmed with pt husband last dose was yesterday (4/26). Pt was scheduled for video visit with ID on 11/02/21 but unable to get video to work initially and appt was rescheduled for Thursday. Pt has received 1 g IV vanc in ED today (4/27).  ? ?Plan: ? ?Patient to go to I&D procedure and receive another 1gm of vancomycin this afternoon. ?Will order random vancomycin level with AM labs ? ? ?Height: '5\' 4"'$  (162.6 cm) ?Weight: 98 kg (216 lb) ?IBW/kg (Calculated) : 54.7 ? ?Temp (24hrs), Avg:98.4 ?F (36.9 ?C), Min:98.2 ?F (36.8 ?C), Max:98.8 ?F (37.1 ?C) ? ?Recent Labs  ?Lab 11/04/21 ?1524 11/04/21 ?1820 11/04/21 ?2315 11/05/21 ?0512  ?WBC 11.1*  --  9.6  --   ?CREATININE 1.12*  --   --   --   ?LATICACIDVEN  --  1.2 0.9  --   ?VANCORANDOM  --   --   --  17  ? ?  ?Estimated Creatinine Clearance: 66.8 mL/min (A) (by C-G formula based on SCr of 1.12 mg/dL (H)).   ? ?Allergies  ?Allergen Reactions  ? Adhesive [Tape] Rash and Other (See Comments)  ?  TAKES OFF THE SKIN (CERTAIN MEDICAL TAPES DO THIS!!)  ? Metoprolol Shortness Of Breath  ?  Occurrence of shortness of breath after 3 days  ? Montelukast Shortness Of Breath  ? Morphine Sulfate Anaphylaxis, Shortness Of Breath and Nausea And Vomiting  ?  Swollen Throat - Able to tolerate dilaudid  ? Penicillins Anaphylaxis, Hives and Shortness Of Breath  ?  Throat swells ?Has patient had a PCN reaction causing immediate rash, facial/tongue/throat swelling, SOB or lightheadedness with hypotension: Yes ?Has patient had a PCN reaction causing severe rash involving mucus membranes or skin necrosis: No ?Has patient had a PCN reaction that required hospitalization: Yes ?Has patient had a PCN reaction occurring within the last 10 years: No ?If all of the  above answers are "NO", then may proceed with Cephalosporin use. ?  ? Silicone Rash  ?  Takes off the skin (certain medical tapes do this)  ? Diltiazem Swelling  ? Doxycycline Hives  ? Gabapentin Swelling  ? Midodrine   ?  Lightheaded and falling down  ? Percocet [Oxycodone-Acetaminophen] Rash  ? ? ?Antimicrobials this admission: ?4/27 vancomycin >>  ? ?Dose adjustments this admission: ? ? ?Microbiology results: ?4/27 BCx: pending ?3/31 Bcx: MRSA ?3/31 Wound: MRSA ? ? ?Thank you for allowing pharmacy to be a part of this patient?s care. ? ?Darrick Penna ?11/05/2021 7:49 AM ? ?

## 2021-11-05 NOTE — Transfer of Care (Signed)
Immediate Anesthesia Transfer of Care Note ? ?Patient: [name] ? ?Procedure(s) Performed: IRRIGATION AND DEBRIDEMENT LEFT BELOW KNEE STUMP (Left) ?MINOR APPLICATION OF WOUND VAC (Left: Leg Lower) ? ?Patient Location: PACU ? ?Anesthesia Type:General ? ?Level of Consciousness: drowsy ? ?Airway & Oxygen Therapy: Patient Spontanous Breathing and Patient connected to face mask oxygen ? ?Post-op Assessment: Report given to RN and Post -op Vital signs reviewed and stable ? ?Post vital signs: Reviewed ? ?Last Vitals:  ?Vitals Value Taken Time  ?BP    ?Temp    ?Pulse    ?Resp    ?SpO2    ? ? ?Last Pain:  ?   ? ?Patients Stated Pain Goal: 0 (11/04/21 1501) ? ?Complications: No notable events documented. ?

## 2021-11-05 NOTE — Assessment & Plan Note (Addendum)
?   Creatinine today about baseline, no concern ?

## 2021-11-06 ENCOUNTER — Encounter: Payer: Self-pay | Admitting: Vascular Surgery

## 2021-11-06 DIAGNOSIS — E1165 Type 2 diabetes mellitus with hyperglycemia: Secondary | ICD-10-CM | POA: Diagnosis not present

## 2021-11-06 DIAGNOSIS — Z720 Tobacco use: Secondary | ICD-10-CM

## 2021-11-06 DIAGNOSIS — E871 Hypo-osmolality and hyponatremia: Secondary | ICD-10-CM | POA: Diagnosis not present

## 2021-11-06 DIAGNOSIS — N1831 Chronic kidney disease, stage 3a: Secondary | ICD-10-CM | POA: Diagnosis not present

## 2021-11-06 DIAGNOSIS — L03116 Cellulitis of left lower limb: Secondary | ICD-10-CM | POA: Diagnosis not present

## 2021-11-06 DIAGNOSIS — I5032 Chronic diastolic (congestive) heart failure: Secondary | ICD-10-CM | POA: Diagnosis not present

## 2021-11-06 DIAGNOSIS — N183 Chronic kidney disease, stage 3 unspecified: Secondary | ICD-10-CM

## 2021-11-06 DIAGNOSIS — G40909 Epilepsy, unspecified, not intractable, without status epilepticus: Secondary | ICD-10-CM

## 2021-11-06 DIAGNOSIS — L03119 Cellulitis of unspecified part of limb: Secondary | ICD-10-CM | POA: Diagnosis not present

## 2021-11-06 LAB — BASIC METABOLIC PANEL
Anion gap: 4 — ABNORMAL LOW (ref 5–15)
BUN: 25 mg/dL — ABNORMAL HIGH (ref 6–20)
CO2: 25 mmol/L (ref 22–32)
Calcium: 7.8 mg/dL — ABNORMAL LOW (ref 8.9–10.3)
Chloride: 98 mmol/L (ref 98–111)
Creatinine, Ser: 1.08 mg/dL — ABNORMAL HIGH (ref 0.44–1.00)
GFR, Estimated: >60 mL/min (ref >60)
Glucose, Bld: 213 mg/dL — ABNORMAL HIGH (ref 70–99)
Potassium: 4.6 mmol/L (ref 3.5–5.1)
Sodium: 127 mmol/L — ABNORMAL LOW (ref 135–145)

## 2021-11-06 LAB — GLUCOSE, CAPILLARY
Glucose-Capillary: 171 mg/dL — ABNORMAL HIGH (ref 70–99)
Glucose-Capillary: 210 mg/dL — ABNORMAL HIGH (ref 70–99)
Glucose-Capillary: 212 mg/dL — ABNORMAL HIGH (ref 70–99)

## 2021-11-06 LAB — BPAM RBC
Blood Product Expiration Date: 202305262359
Blood Product Expiration Date: 202305262359
ISSUE DATE / TIME: 202304280300
ISSUE DATE / TIME: 202304280553
Unit Type and Rh: 5100
Unit Type and Rh: 5100

## 2021-11-06 LAB — TYPE AND SCREEN
ABO/RH(D): O POS
Antibody Screen: NEGATIVE
Unit division: 0
Unit division: 0

## 2021-11-06 LAB — CBC
HCT: 24.8 % — ABNORMAL LOW (ref 36.0–46.0)
Hemoglobin: 7.4 g/dL — ABNORMAL LOW (ref 12.0–15.0)
MCH: 24.1 pg — ABNORMAL LOW (ref 26.0–34.0)
MCHC: 29.8 g/dL — ABNORMAL LOW (ref 30.0–36.0)
MCV: 80.8 fL (ref 80.0–100.0)
Platelets: 303 10*3/uL (ref 150–400)
RBC: 3.07 MIL/uL — ABNORMAL LOW (ref 3.87–5.11)
RDW: 18.2 % — ABNORMAL HIGH (ref 11.5–15.5)
WBC: 10.1 10*3/uL (ref 4.0–10.5)
nRBC: 0 % (ref 0.0–0.2)

## 2021-11-06 MED ORDER — ONDANSETRON HCL 4 MG/2ML IJ SOLN
4.0000 mg | Freq: Four times a day (QID) | INTRAMUSCULAR | Status: DC | PRN
Start: 2021-11-06 — End: 2021-11-20

## 2021-11-06 MED ORDER — HYDROMORPHONE HCL 1 MG/ML IJ SOLN
1.0000 mg | Freq: Once | INTRAMUSCULAR | Status: AC | PRN
  Administered 2021-11-13: 1 mg via INTRAVENOUS
  Filled 2021-11-06: qty 1

## 2021-11-06 NOTE — Progress Notes (Signed)
?PROGRESS NOTE ? ? ? ?[name]  WJX:[id number] DOB: 1971/06/24 \ ?DOA: 11/04/2021 ?PCP: Pleas Koch, NP ?Date of service: 11/06/21 today ? ? ? ?Brief Narrative:  ?[name] is a 51 year old Caucasian female with PMH CHF, CAD, DM 2, tobacco use, presenting with concern for wound infection on surgical site, she is status post BKA on 10/11/2021, currently being followed at home by wound care nurse.  Patient presented to the ED 11/04/2021 reports purulent drainage, warmth, pain at surgical site, on left stump, as well as wound on right heel.  Admitted for IV antibiotics and surgical consult for treatment of cellulitis of lower extremity.  Of note, last echocardiogram earlier this month showed LVEF 55 to 60%. ?11/05/21: Seen by vascular, agree with concern for wound infection particularly for possibility of underlying abscess, planning to take patient to the OR for debridement and possible wound VAC placement, also recommending infectious disease consult.  For diabetic ulcer on right heel, progressing toward ankle, planning for angiogram on Tuesday 11/09/21.  Infectious disease consulted. Underwent Excisional debridement left below-knee amputation stump including skin subcutaneous tissues muscle and bone, and Application of a wound VAC. Pt was having difficulty voiding, Foley was ordered.  ?Today 11/06/21 wound VAC in place and no concerns on exam of stump from my perspective, bandages in place on right heel.  Sodium continues to be slightly low but corrects to 130 given hyperglycemia.  Hemoglobin yesterday 8.0, 7.4 today, expected drop likely due to surgery, can continue to monitor ? ? ? ?Assessment & Plan: ?  ?Principal Problem: ?  Cellulitis of lower extremity ?Active Problems: ?  Cellulitis ?  Uncontrolled type 2 diabetes mellitus with hyperglycemia, with long-term current use of insulin ([place]) ?  Chronic diastolic CHF (congestive heart failure) ([place]) ?  PVD (peripheral vascular disease) ([place]) ?   Hyponatremia ?  Tobacco abuse ?  Chronic kidney disease, stage 3a ([place]) ?  Epilepsy, unspecified, not intractable, without status epilepticus ([place]) ?  Anemia ? ? ?Cellulitis of lower extremity ?Continue current IV antibiotics, ID is consulted, appreciate their recommendations as well ?11/05/21 underwent Excisional debridement left below-knee amputation stump including skin subcutaneous tissues muscle and bone and application of a wound VAC w/ vascular surgery  ? ?Uncontrolled type 2 diabetes mellitus with hyperglycemia, with long-term current use of insulin ([place]) ?SSI/ glycemic protocol. ?Home regimen of synjardy held due to abnormal creatinine and prevention of hypoglycemia. ? ? ?Chronic diastolic CHF (congestive heart failure) ([place]) ?Stable. ?Last echo earlier this month done by dr Nehemiah Massed. ?Cont her on aspirin 81, rosuvastatin, Aldactone. ? ?Hyponatremia ?Sodium corrects to 130 given Glc 213 ?Continue slow IV fluids ? ? ?Tobacco abuse ?Nicotine patch. ? ?Chronic kidney disease, stage 3a ([place]) ?Creatinine today about baseline, no concern ? ?Epilepsy, unspecified, not intractable, without status epilepticus ([place]) ?Pt is not on AED's. ?Aspiration and seizure precaution.  ? ?Anemia ?Likely due to surgical blood loss ?Patient is status posttransfusion 2 units PRBC on this hospitalization ?Hemoglobin today down slightly from 8.0 yesterday to 7.4 today, given debridement yesterday as well as patient still on IV fluids not too concerning of a drop but will monitor and consider repeat transfusion if needed ? ? ?PVD (peripheral vascular disease) ([place]) ?Diabetic ulcer right heel, vascular surgery planning for angiography next week (11/09/2021) ? ? ? ?DVT prophylaxis: Heparin ?Code Status: Full code ?Family Communication: Husband is at bedside ?Disposition Plan: Pending surgical interventions, angiography, see assessment/plan above.  Likely patient will need SNF. ? ? ?Consultants:  ?Vascular  surgery ?Infectious  disease ? ?Procedures: ?Irrigation and debridement of left below-knee stump, 11/05/2021 ? ? ? ? ? ? ? ? ? ? ? ? ? ? ? ? ? ? ?Antimicrobials:  ?Anti-infectives (From admission, onward)  ? ? Start     Dose/Rate Route Frequency Ordered Stop  ? 11/06/21 1200  DAPTOmycin (CUBICIN) 500 mg in sodium chloride 0.9 % IVPB       ? 500 mg ?120 mL/hr over 30 Minutes Intravenous Daily 11/05/21 1657    ? 11/05/21 2200  cefTRIAXone (ROCEPHIN) 2 g in sodium chloride 0.9 % 100 mL IVPB       ? 2 g ?200 mL/hr over 30 Minutes Intravenous Every 24 hours 11/05/21 1657    ? 11/05/21 2200  metroNIDAZOLE (FLAGYL) tablet 500 mg       ? 500 mg Oral Every 12 hours 11/05/21 1657    ? 11/05/21 1100  vancomycin (VANCOCIN) IVPB 1000 mg/200 mL premix       ? 1,000 mg ?200 mL/hr over 60 Minutes Intravenous On call to O.R. 11/05/21 1013 11/05/21 1954  ? 11/05/21 0130  vancomycin IVPB  Status:  Discontinued       ?Note to Pharmacy: Indication:  MRSA bacteremia ?First Dose: Yes ?Last Day of Therapy:  11/07/2021 ?Labs - Monday:  CBC/D, CMP, and vancomycin trough.  ?Labs - Thursday:  BMP and vancomycin trough  ?Please pull PIC at completion of IV antibiotics  ?Fax labs to [phone] ?Method of administration:Elastomeric ?Metho  ? 1,000 mg Intravenous Every 24 hours 11/05/21 0036 11/05/21 0040  ? 11/04/21 2015  vancomycin (VANCOCIN) IVPB 1000 mg/200 mL premix  Status:  Discontinued       ? 1,000 mg ?200 mL/hr over 60 Minutes Intravenous  Once 11/04/21 2014 11/04/21 2014  ? 11/04/21 1815  vancomycin (VANCOCIN) IVPB 1000 mg/200 mL premix       ? 1,000 mg ?200 mL/hr over 60 Minutes Intravenous  Once 11/04/21 1806 11/04/21 1944  ? ?  ? ? ? ?Subjective: ?Patient seen and examined resting comfortably in bed, no apparent distress.  Husband is at bedside.  She seems a bit more awake/alert compared to yesterday, she states pain is overall controlled, she is still feeling tired.  Tolerating diet. ? ?Objective: ?Vitals:  ? 11/05/21 2030 11/05/21 2243 11/06/21 0303  11/06/21 1105  ?BP: 128/74 131/71 129/70 119/65  ?Pulse: 71 76 75 80  ?Resp: '15 18 16 16  '$ ?Temp: 97.8 ?F (36.6 ?C)  97.6 ?F (36.4 ?C) 98.9 ?F (37.2 ?C)  ?TempSrc:      ?SpO2: 96% 100% 99% 96%  ?Weight:      ?Height:      ? ? ?Intake/Output Summary (Last 24 hours) at 11/06/2021 1256 ?Last data filed at 11/06/2021 7616 ?Gross per 24 hour  ?Intake 1478.23 ml  ?Output 510 ml  ?Net 968.23 ml  ? ?Filed Weights  ? 11/04/21 1501  ?Weight: 98 kg  ? ? ?Examination: ? ?General exam: Appears calm and comfortable  ?Respiratory system: Clear to auscultation. Respiratory effort normal. ?Cardiovascular system: S1 & S2 heard, RRR. No JVD, murmurs, rubs, gallops or clicks. ?Gastrointestinal system: Abdomen is nondistended, soft and nontender.  ?Central nervous system: Alert and oriented  ?Extremities: Symmetric 5 x 5 power. ?Skin: Dressings were in place and were not disturbed on left stump wound VAC and right heel, vascular is following ? ? ? ? ?Data Reviewed: I have personally reviewed following labs and imaging studies ? ?CBC: ?Recent Labs  ?Lab  11/04/21 ?1524 11/04/21 ?2315 11/05/21 ?0156 11/06/21 ?1008  ?WBC 11.1* 9.6 10.6* 10.1  ?NEUTROABS 9.0*  --   --   --   ?HGB 7.8* 6.6* 8.0* 7.4*  ?HCT 27.3* 22.4* 26.8* 24.8*  ?MCV 83.2 80.3 80.7 80.8  ?PLT 367 320 301 303  ? ?Basic Metabolic Panel: ?Recent Labs  ?Lab 11/04/21 ?1524 11/05/21 ?1537 11/06/21 ?1008  ?NA 129* 128* 127*  ?K 4.8 4.5 4.6  ?CL 96* 97* 98  ?CO2 '25 24 25  '$ ?GLUCOSE 329* 237* 213*  ?BUN 19 20 25*  ?CREATININE 1.12* 0.96 1.08*  ?CALCIUM 8.2* 8.0* 7.8*  ? ?GFR: ?Estimated Creatinine Clearance: 69.3 mL/min (A) (by C-G formula based on SCr of 1.08 mg/dL (H)). ?Liver Function Tests: ?Recent Labs  ?Lab 11/04/21 ?1524 11/05/21 ?0947  ?AST 18 11*  ?ALT 9 9  ?ALKPHOS 183* 145*  ?BILITOT 0.9 1.2  ?PROT 9.2* 8.0  ?ALBUMIN 2.3* 2.0*  ? ?No results for input(s): LIPASE, AMYLASE in the last 168 hours. ?No results for input(s): AMMONIA in the last 168 hours. ?Coagulation  Profile: ?Recent Labs  ?Lab 11/04/21 ?1820  ?INR 1.4*  ? ?Cardiac Enzymes: ?Recent Labs  ?Lab 11/05/21 ?0947  ?CKTOTAL 31*  ? ?BNP (last 3 results) ?Recent Labs  ?  12/01/20 ?1250  ?PROBNP 78.0  ? ?HbA1C: ?No results for input(s): HGBA1C

## 2021-11-06 NOTE — Plan of Care (Signed)

## 2021-11-07 DIAGNOSIS — L03115 Cellulitis of right lower limb: Secondary | ICD-10-CM

## 2021-11-07 DIAGNOSIS — L03116 Cellulitis of left lower limb: Secondary | ICD-10-CM | POA: Diagnosis not present

## 2021-11-07 DIAGNOSIS — N1831 Chronic kidney disease, stage 3a: Secondary | ICD-10-CM

## 2021-11-07 DIAGNOSIS — I5032 Chronic diastolic (congestive) heart failure: Secondary | ICD-10-CM | POA: Diagnosis not present

## 2021-11-07 LAB — CBC
HCT: 23.3 % — ABNORMAL LOW (ref 36.0–46.0)
Hemoglobin: 7.1 g/dL — ABNORMAL LOW (ref 12.0–15.0)
MCH: 24.5 pg — ABNORMAL LOW (ref 26.0–34.0)
MCHC: 30.5 g/dL (ref 30.0–36.0)
MCV: 80.3 fL (ref 80.0–100.0)
Platelets: 301 10*3/uL (ref 150–400)
RBC: 2.9 MIL/uL — ABNORMAL LOW (ref 3.87–5.11)
RDW: 18.4 % — ABNORMAL HIGH (ref 11.5–15.5)
WBC: 8.6 10*3/uL (ref 4.0–10.5)
nRBC: 0 % (ref 0.0–0.2)

## 2021-11-07 LAB — BASIC METABOLIC PANEL
Anion gap: 7 (ref 5–15)
BUN: 17 mg/dL (ref 6–20)
CO2: 21 mmol/L — ABNORMAL LOW (ref 22–32)
Calcium: 7.7 mg/dL — ABNORMAL LOW (ref 8.9–10.3)
Chloride: 102 mmol/L (ref 98–111)
Creatinine, Ser: 1 mg/dL (ref 0.44–1.00)
GFR, Estimated: >60 mL/min (ref >60)
Glucose, Bld: 200 mg/dL — ABNORMAL HIGH (ref 70–99)
Potassium: 4.2 mmol/L (ref 3.5–5.1)
Sodium: 130 mmol/L — ABNORMAL LOW (ref 135–145)

## 2021-11-07 LAB — GLUCOSE, CAPILLARY
Glucose-Capillary: 256 mg/dL — ABNORMAL HIGH (ref 70–99)
Glucose-Capillary: 261 mg/dL — ABNORMAL HIGH (ref 70–99)
Glucose-Capillary: 275 mg/dL — ABNORMAL HIGH (ref 70–99)
Glucose-Capillary: 283 mg/dL — ABNORMAL HIGH (ref 70–99)
Glucose-Capillary: 323 mg/dL — ABNORMAL HIGH (ref 70–99)

## 2021-11-07 MED ORDER — SODIUM CHLORIDE 0.9 % IV SOLN
2.0000 g | Freq: Three times a day (TID) | INTRAVENOUS | Status: AC
  Administered 2021-11-07 – 2021-11-08 (×5): 2 g via INTRAVENOUS
  Filled 2021-11-07 (×3): qty 2
  Filled 2021-11-07: qty 12.5
  Filled 2021-11-07: qty 2

## 2021-11-07 NOTE — Progress Notes (Signed)
?PROGRESS NOTE ? ? ? ?[name]  NOI:[id number] DOB: 25-Jan-1971 \ ?DOA: 11/04/2021 ?PCP: Pleas Koch, NP ?Date of service: 11/07/21 today ? ? ? ?Brief Narrative:  ?Ms. Shinall is a 51 year old Caucasian female with PMH CHF, CAD, DM 2, tobacco use, presenting with concern for wound infection on surgical site, she is status post BKA on 10/11/2021, currently being followed at home by wound care nurse.  Patient presented to the ED 11/04/2021 reports purulent drainage, warmth, pain at surgical site, on left stump, as well as wound on right heel.  Admitted for IV antibiotics and surgical consult for treatment of cellulitis of lower extremity.  Of note, last echocardiogram earlier this month showed LVEF 55 to 60%. ?11/05/21: Seen by vascular, agree with concern for wound infection particularly for possibility of underlying abscess, planning to take patient to the OR for debridement and possible wound VAC placement, also recommending infectious disease consult.  For diabetic ulcer on right heel, progressing toward ankle, planning for angiogram on Tuesday 11/09/21.  Infectious disease consulted. Underwent Excisional debridement left below-knee amputation stump including skin subcutaneous tissues muscle and bone, and Application of a wound VAC. Pt was having difficulty voiding, Foley was ordered.  ?11/06/21 wound VAC in place, bandages in place on right heel.  Sodium continues to be slightly low but corrects to 130 given hyperglycemia.  Hemoglobin yesterday 8.0, 7.4 today, expected drop likely due to surgery, can continue to monitor ?11/07/21 Hgb 7.1, sodium corrects to 132, pt resting today, anticipating angiography w/ vascular surgery.  WBC continuing to trend down ? ? ? ?Assessment & Plan: ?  ?Principal Problem: ?  Cellulitis of lower extremity ?Active Problems: ?  Uncontrolled type 2 diabetes mellitus with hyperglycemia, with long-term current use of insulin ([place]) ?  Chronic diastolic CHF (congestive heart failure)  ([place]) ?  PVD (peripheral vascular disease) ([place]) ?  Hyponatremia ?  Tobacco abuse ?  Chronic kidney disease, stage 3a ([place]) ?  Epilepsy, unspecified, not intractable, without status epilepticus ([place]) ?  Anemia ? ? ?Cellulitis of lower extremity ?Continue current IV antibiotics, ID is consulted, appreciate their recommendations as well ?11/05/21 underwent Excisional debridement left below-knee amputation stump including skin subcutaneous tissues muscle and bone and application of a wound VAC w/ vascular surgery  ? ?Uncontrolled type 2 diabetes mellitus with hyperglycemia, with long-term current use of insulin ([place]) ?SSI/ glycemic protocol. ?Home regimen of synjardy held due to abnormal creatinine and prevention of hypoglycemia. ? ? ?Chronic diastolic CHF (congestive heart failure) ([place]) ?Stable. ?Last echo earlier this month done by dr Nehemiah Massed. ?Cont her on aspirin 81, rosuvastatin, Aldactone. ? ?Hyponatremia ?Sodium corrects to 130 given Glc 213 yesterday --> today Na 132 w/ Glc 200 ?Continue slow IV fluids ? ? ?Tobacco abuse ?Nicotine patch. ? ?Chronic kidney disease, stage 3a ([place]) ?Creatinine today about baseline, no concern ? ?Epilepsy, unspecified, not intractable, without status epilepticus ([place]) ?Pt is not on AED's. ?Aspiration and seizure precaution.  ? ?Anemia ?Likely due to surgical blood loss ?Patient is status posttransfusion 2 units PRBC on this hospitalization ?Hemoglobin today down slightly from 8.0 two days ago to 7.4 yesterday to 7.1 today, if drops again tomorrow will need to transfuse (given debridement as well as patient still on IV fluids also contributing to anemia)  ? ? ?PVD (peripheral vascular disease) ([place]) ?Diabetic ulcer right heel, vascular surgery planning for angiography in a few days (11/09/2021) ? ? ? ?DVT prophylaxis: Heparin ?Code Status: Full code ?Family Communication: Husband is at bedside ?Disposition  Plan: Pending surgical interventions, angiography, see assessment/plan  above.  Likely patient will need SNF. ? ? ?Consultants:  ?Vascular surgery ?Infectious disease ? ?Procedures: ?Irrigation and debridement of left below-knee stump, 11/05/2021 ? ? ? ? ? ? ? ? ? ? ? ? ? ? ? ? ? ? ?Antimicrobials:  ?Anti-infectives (From admission, onward)  ? ? Start     Dose/Rate Route Frequency Ordered Stop  ? 11/07/21 1400  ceFEPIme (MAXIPIME) 2 g in sodium chloride 0.9 % 100 mL IVPB       ? 2 g ?200 mL/hr over 30 Minutes Intravenous Every 8 hours 11/07/21 1226    ? 11/06/21 1200  DAPTOmycin (CUBICIN) 500 mg in sodium chloride 0.9 % IVPB       ? 500 mg ?120 mL/hr over 30 Minutes Intravenous Daily 11/05/21 1657    ? 11/05/21 2200  cefTRIAXone (ROCEPHIN) 2 g in sodium chloride 0.9 % 100 mL IVPB  Status:  Discontinued       ? 2 g ?200 mL/hr over 30 Minutes Intravenous Every 24 hours 11/05/21 1657 11/07/21 1217  ? 11/05/21 2200  metroNIDAZOLE (FLAGYL) tablet 500 mg       ? 500 mg Oral Every 12 hours 11/05/21 1657    ? 11/05/21 1100  vancomycin (VANCOCIN) IVPB 1000 mg/200 mL premix       ? 1,000 mg ?200 mL/hr over 60 Minutes Intravenous On call to O.R. 11/05/21 1013 11/05/21 1954  ? 11/05/21 0130  vancomycin IVPB  Status:  Discontinued       ?Note to Pharmacy: Indication:  MRSA bacteremia ?First Dose: Yes ?Last Day of Therapy:  11/07/2021 ?Labs - Monday:  CBC/D, CMP, and vancomycin trough.  ?Labs - Thursday:  BMP and vancomycin trough  ?Please pull PIC at completion of IV antibiotics  ?Fax labs to [phone] ?Method of administration:Elastomeric ?Metho  ? 1,000 mg Intravenous Every 24 hours 11/05/21 0036 11/05/21 0040  ? 11/04/21 2015  vancomycin (VANCOCIN) IVPB 1000 mg/200 mL premix  Status:  Discontinued       ? 1,000 mg ?200 mL/hr over 60 Minutes Intravenous  Once 11/04/21 2014 11/04/21 2014  ? 11/04/21 1815  vancomycin (VANCOCIN) IVPB 1000 mg/200 mL premix       ? 1,000 mg ?200 mL/hr over 60 Minutes Intravenous  Once 11/04/21 1806 11/04/21 1944  ? ?  ? ? ? ?Subjective: ?Patient seen and examined  resting comfortably in bed, she was sleeping when I first entered the room but she wakes easily, husband states she has been overall feeling well, pain is controlled. ? ?Objective: ?Vitals:  ? 11/06/21 2123 11/07/21 0554 11/07/21 0850 11/07/21 1235  ?BP: 121/65 131/65 125/62 123/69  ?Pulse: 73 75 77 75  ?Resp: '16 16 16 16  '$ ?Temp: 98.9 ?F (37.2 ?C) 98.6 ?F (37 ?C) 98.3 ?F (36.8 ?C) 98.4 ?F (36.9 ?C)  ?TempSrc: Oral Oral    ?SpO2: 98% 98% 92% 97%  ?Weight:  103.4 kg    ?Height:      ? ? ?Intake/Output Summary (Last 24 hours) at 11/07/2021 1345 ?Last data filed at 11/07/2021 0559 ?Gross per 24 hour  ?Intake 10 ml  ?Output 775 ml  ?Net -765 ml  ? ?Filed Weights  ? 11/04/21 1501 11/07/21 0554  ?Weight: 98 kg 103.4 kg  ? ? ?Examination: ? ?General exam: Appears calm and comfortable  ?Respiratory system: Clear to auscultation. Respiratory effort normal. ?Cardiovascular system: S1 & S2 heard, RRR. No JVD, murmurs, rubs, gallops or clicks. ?Gastrointestinal  system: Abdomen is nondistended, soft and nontender.  ?Central nervous system: Alert and oriented  ?Extremities: Symmetric 5 x 5 power. ?Skin: Dressings were in place and were not disturbed on left stump wound VAC and right heel, vascular is following ? ? ? ? ?Data Reviewed: I have personally reviewed following labs and imaging studies ? ?CBC: ?Recent Labs  ?Lab 11/04/21 ?1524 11/04/21 ?2315 11/05/21 ?3953 11/06/21 ?1008 11/07/21 ?0559  ?WBC 11.1* 9.6 10.6* 10.1 8.6  ?NEUTROABS 9.0*  --   --   --   --   ?HGB 7.8* 6.6* 8.0* 7.4* 7.1*  ?HCT 27.3* 22.4* 26.8* 24.8* 23.3*  ?MCV 83.2 80.3 80.7 80.8 80.3  ?PLT 367 320 301 303 301  ? ?Basic Metabolic Panel: ?Recent Labs  ?Lab 11/04/21 ?1524 11/05/21 ?2023 11/06/21 ?1008 11/07/21 ?0518  ?NA 129* 128* 127* 130*  ?K 4.8 4.5 4.6 4.2  ?CL 96* 97* 98 102  ?CO2 '25 24 25 '$ 21*  ?GLUCOSE 329* 237* 213* 200*  ?BUN 19 20 25* 17  ?CREATININE 1.12* 0.96 1.08* 1.00  ?CALCIUM 8.2* 8.0* 7.8* 7.7*  ? ?GFR: ?Estimated Creatinine Clearance: 77.1  mL/min (by C-G formula based on SCr of 1 mg/dL). ?Liver Function Tests: ?Recent Labs  ?Lab 11/04/21 ?1524 11/05/21 ?0947  ?AST 18 11*  ?ALT 9 9  ?ALKPHOS 183* 145*  ?BILITOT 0.9 1.2  ?PROT 9.2* 8.0  ?ALBUMIN 2.3*

## 2021-11-07 NOTE — Progress Notes (Signed)
Pharmacy Antibiotic Note ? ?[name] is a 51 y.o. female admitted on 11/04/2021 with left BKA stump infection. Patient also with recent MRSA bacteremia secondary to MRSA left foot infection for which she underwent BKA on 10/11/21. Patient was on outpatient IV antibiotics with vancomycin for this. Patient is s/p excisional debridement on 4/28. ID is following. Patient has been switched from vancomycin to daptomycin. She has also been on ceftriaxone and Flagyl. Wound culture now showing Enterobacter cloacae. Ceftriaxone being switched to cefepime. Pharmacy consulted for cefepime dosing. ? ?Plan: ? ?Daptomycin 500 mg IV daily ? ?Metronidazole 500 mg BID ? ?Cefepime 2 g IV q8h ? ? ?Height: '5\' 4"'$  (162.6 cm) ?Weight: 103.4 kg (228 lb) ?IBW/kg (Calculated) : 54.7 ? ?Temp (24hrs), Avg:98.3 ?F (36.8 ?C), Min:97.7 ?F (36.5 ?C), Max:98.9 ?F (37.2 ?C) ? ?Recent Labs  ?Lab 11/04/21 ?1524 11/04/21 ?1820 11/04/21 ?2315 11/05/21 ?6256 11/05/21 ?3893 11/06/21 ?1008 11/07/21 ?0518 11/07/21 ?0559  ?WBC 11.1*  --  9.6  --  10.6* 10.1  --  8.6  ?CREATININE 1.12*  --   --   --  0.96 1.08* 1.00  --   ?LATICACIDVEN  --  1.2 0.9  --   --   --   --   --   ?VANCORANDOM  --   --   --  17  --   --   --   --   ? ?  ?Estimated Creatinine Clearance: 77.1 mL/min (by C-G formula based on SCr of 1 mg/dL).   ? ?Allergies  ?Allergen Reactions  ? Adhesive [Tape] Rash and Other (See Comments)  ?  TAKES OFF THE SKIN (CERTAIN MEDICAL TAPES DO THIS!!)  ? Metoprolol Shortness Of Breath  ?  Occurrence of shortness of breath after 3 days  ? Montelukast Shortness Of Breath  ? Morphine Sulfate Anaphylaxis, Shortness Of Breath and Nausea And Vomiting  ?  Swollen Throat - Able to tolerate dilaudid  ? Penicillins Anaphylaxis, Hives and Shortness Of Breath  ?  Throat swells ?Has patient had a PCN reaction causing immediate rash, facial/tongue/throat swelling, SOB or lightheadedness with hypotension: Yes ?Has patient had a PCN reaction causing severe rash involving  mucus membranes or skin necrosis: No ?Has patient had a PCN reaction that required hospitalization: Yes ?Has patient had a PCN reaction occurring within the last 10 years: No ?If all of the above answers are "NO", then may proceed with Cephalosporin use. ?  ? Silicone Rash  ?  Takes off the skin (certain medical tapes do this)  ? Diltiazem Swelling  ? Doxycycline Hives  ? Gabapentin Swelling  ? Midodrine   ?  Lightheaded and falling down  ? Percocet [Oxycodone-Acetaminophen] Rash  ? ? ?Antimicrobials this admission: ?Vancomycin 4/27 >> 4/28 ?Ceftriaxone 4/28 >> 4/29 ?Metronidazole 4/28 >> ?Daptomycin 4/29 >> ?Cefepime 4/30 >> ? ?Dose adjustments this admission: ?N/A ? ?Microbiology results: ?3/31 Bcx: MRSA ?3/31 Wound: MRSA ?4/27 BCx: NGTD ?4/28 R heel Cx: K pneumo, pending ?4/28 L BKA stump (deep wound): Enterobacter cloacae ?4/28 L BKA stump (deeper wound): Enterobacter cloacae ? ? ?Thank you for allowing pharmacy to be a part of this patient?s care. ? ?Benita Gutter ?11/07/2021 12:27 PM ? ?

## 2021-11-08 DIAGNOSIS — L959 Vasculitis limited to the skin, unspecified: Secondary | ICD-10-CM

## 2021-11-08 DIAGNOSIS — R21 Rash and other nonspecific skin eruption: Secondary | ICD-10-CM

## 2021-11-08 DIAGNOSIS — L089 Local infection of the skin and subcutaneous tissue, unspecified: Secondary | ICD-10-CM | POA: Diagnosis not present

## 2021-11-08 DIAGNOSIS — N1831 Chronic kidney disease, stage 3a: Secondary | ICD-10-CM | POA: Diagnosis not present

## 2021-11-08 DIAGNOSIS — L03116 Cellulitis of left lower limb: Secondary | ICD-10-CM | POA: Diagnosis not present

## 2021-11-08 DIAGNOSIS — L03119 Cellulitis of unspecified part of limb: Secondary | ICD-10-CM | POA: Diagnosis not present

## 2021-11-08 DIAGNOSIS — T8740 Infection of amputation stump, unspecified extremity: Secondary | ICD-10-CM | POA: Diagnosis not present

## 2021-11-08 DIAGNOSIS — T148XXA Other injury of unspecified body region, initial encounter: Secondary | ICD-10-CM | POA: Diagnosis not present

## 2021-11-08 DIAGNOSIS — I5032 Chronic diastolic (congestive) heart failure: Secondary | ICD-10-CM | POA: Diagnosis not present

## 2021-11-08 HISTORY — DX: Rash and other nonspecific skin eruption: R21

## 2021-11-08 LAB — CBC
HCT: 24.3 % — ABNORMAL LOW (ref 36.0–46.0)
HCT: 24.9 % — ABNORMAL LOW (ref 36.0–46.0)
Hemoglobin: 7.1 g/dL — ABNORMAL LOW (ref 12.0–15.0)
Hemoglobin: 7.3 g/dL — ABNORMAL LOW (ref 12.0–15.0)
MCH: 23.9 pg — ABNORMAL LOW (ref 26.0–34.0)
MCH: 24 pg — ABNORMAL LOW (ref 26.0–34.0)
MCHC: 29.2 g/dL — ABNORMAL LOW (ref 30.0–36.0)
MCHC: 29.3 g/dL — ABNORMAL LOW (ref 30.0–36.0)
MCV: 81.8 fL (ref 80.0–100.0)
MCV: 81.9 fL (ref 80.0–100.0)
Platelets: 300 10*3/uL (ref 150–400)
Platelets: 327 10*3/uL (ref 150–400)
RBC: 2.97 MIL/uL — ABNORMAL LOW (ref 3.87–5.11)
RBC: 3.04 MIL/uL — ABNORMAL LOW (ref 3.87–5.11)
RDW: 18.2 % — ABNORMAL HIGH (ref 11.5–15.5)
RDW: 18.6 % — ABNORMAL HIGH (ref 11.5–15.5)
WBC: 7.7 10*3/uL (ref 4.0–10.5)
WBC: 8.2 10*3/uL (ref 4.0–10.5)
nRBC: 0 % (ref 0.0–0.2)
nRBC: 0 % (ref 0.0–0.2)

## 2021-11-08 LAB — BASIC METABOLIC PANEL
Anion gap: 6 (ref 5–15)
BUN: 30 mg/dL — ABNORMAL HIGH (ref 6–20)
CO2: 27 mmol/L (ref 22–32)
Calcium: 8.3 mg/dL — ABNORMAL LOW (ref 8.9–10.3)
Chloride: 100 mmol/L (ref 98–111)
Creatinine, Ser: 1.12 mg/dL — ABNORMAL HIGH (ref 0.44–1.00)
GFR, Estimated: 59 mL/min — ABNORMAL LOW (ref >60)
Glucose, Bld: 254 mg/dL — ABNORMAL HIGH (ref 70–99)
Potassium: 5.3 mmol/L — ABNORMAL HIGH (ref 3.5–5.1)
Sodium: 133 mmol/L — ABNORMAL LOW (ref 135–145)

## 2021-11-08 LAB — COMPREHENSIVE METABOLIC PANEL
ALT: 8 U/L (ref 0–44)
AST: 13 U/L — ABNORMAL LOW (ref 15–41)
Albumin: 1.9 g/dL — ABNORMAL LOW (ref 3.5–5.0)
Alkaline Phosphatase: 148 U/L — ABNORMAL HIGH (ref 38–126)
Anion gap: 11 (ref 5–15)
BUN: 32 mg/dL — ABNORMAL HIGH (ref 6–20)
CO2: 23 mmol/L (ref 22–32)
Calcium: 8 mg/dL — ABNORMAL LOW (ref 8.9–10.3)
Chloride: 96 mmol/L — ABNORMAL LOW (ref 98–111)
Creatinine, Ser: 1.2 mg/dL — ABNORMAL HIGH (ref 0.44–1.00)
GFR, Estimated: 54 mL/min — ABNORMAL LOW (ref >60)
Glucose, Bld: 281 mg/dL — ABNORMAL HIGH (ref 70–99)
Potassium: 5.2 mmol/L — ABNORMAL HIGH (ref 3.5–5.1)
Sodium: 130 mmol/L — ABNORMAL LOW (ref 135–145)
Total Bilirubin: 0.6 mg/dL (ref 0.3–1.2)
Total Protein: 8.2 g/dL — ABNORMAL HIGH (ref 6.5–8.1)

## 2021-11-08 LAB — GLUCOSE, CAPILLARY
Glucose-Capillary: 263 mg/dL — ABNORMAL HIGH (ref 70–99)
Glucose-Capillary: 263 mg/dL — ABNORMAL HIGH (ref 70–99)
Glucose-Capillary: 274 mg/dL — ABNORMAL HIGH (ref 70–99)
Glucose-Capillary: 278 mg/dL — ABNORMAL HIGH (ref 70–99)
Glucose-Capillary: 288 mg/dL — ABNORMAL HIGH (ref 70–99)

## 2021-11-08 LAB — CK: Total CK: 15 U/L — ABNORMAL LOW (ref 38–234)

## 2021-11-08 MED ORDER — SODIUM CHLORIDE 0.9 % IV SOLN
1.0000 g | INTRAVENOUS | Status: DC
  Administered 2021-11-09 – 2021-11-17 (×9): 1000 mg via INTRAVENOUS
  Filled 2021-11-08 (×9): qty 1

## 2021-11-08 MED ORDER — INSULIN GLARGINE-YFGN 100 UNIT/ML ~~LOC~~ SOLN
20.0000 [IU] | Freq: Every day | SUBCUTANEOUS | Status: DC
  Administered 2021-11-08: 20 [IU] via SUBCUTANEOUS
  Filled 2021-11-08 (×3): qty 0.2

## 2021-11-08 NOTE — Consult Note (Signed)
[facility] CM Inpatient Consult ? ? ?11/08/2021 ? ?Earnesteen Romero ?[birth year] ?[id number] ? ?[place] Management [facility] CM) ?  ?Patient chart has been reviewed with noted high risk score for unplanned readmissions and unplanned 30 day readmission.  Patient assessed for community [place] Management follow up needs.  ? ?Patient is active with primary provider office embedded chronic care coordination team. Office listed for TOC follow-up. ? ?Plan: Will continue to follow for progression and disposition. ?  ?Of note, [facility] Care Management services does not replace or interfere with any services that are arranged by inpatient case management or social work.  ?  ?Netta Cedars, MSN, RN ?[facility] Liaison ?Phone [phone] ?Toll free office [phone]   ? ?
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
## 2021-11-08 NOTE — Assessment & Plan Note (Addendum)
?   See photos  ?? Concern for vasculitis but unusual it would be localized, I don't think this would be related to PICC, unable to botain access in other arm w/ hx breast CA ?? Concern for [place] nodes on fingertips ?? WBC and VS ok, low suspicion for endocarditis but patient certainly has risk factors, will get echocardiogram  ?? Rheumatologic labs ordered and pending  ?? Rash is stable 05/02 today  ?

## 2021-11-08 NOTE — Progress Notes (Signed)
?PROGRESS NOTE ? ? ? ?[name]  UXL:[id number] DOB: 1970/12/13 \ ?DOA: 11/04/2021 ?PCP: Pleas Koch, NP ?Date of service: 11/08/21 today ? ? ? ?Brief Narrative:  ?[name] is a 51 year old Caucasian female with PMH CHF, CAD, DM 2, tobacco use, presenting with concern for wound infection on surgical site, she is status post BKA on 10/11/2021, currently being followed at home by wound care nurse.  Patient presented to the ED 11/04/2021 reports purulent drainage, warmth, pain at surgical site, on left stump, as well as wound on right heel.  Admitted for IV antibiotics and surgical consult for treatment of cellulitis of lower extremity.  Of note, last echocardiogram earlier this month showed LVEF 55 to 60%. ?11/05/21: Seen by vascular, agree with concern for wound infection particularly for possibility of underlying abscess, planning to take patient to the OR for debridement and possible wound VAC placement, also recommending infectious disease consult.  For diabetic ulcer on right heel, progressing toward ankle, planning for angiogram on Tuesday 11/09/21.  Infectious disease consulted. Underwent Excisional debridement left below-knee amputation stump including skin subcutaneous tissues muscle and bone, and Application of a wound VAC. Pt was having difficulty voiding, Foley was ordered.  ?11/06/21 wound VAC in place, bandages in place on right heel.  Sodium continues to be slightly low but corrects to 130 given hyperglycemia.  Hemoglobin yesterday 8.0, 7.4 today, expected drop likely due to surgery, can continue to monitor ?11/07/21 Hgb 7.1, sodium corrects to 132, pt resting today, anticipating angiography w/ vascular surgery.  WBC continuing to trend down ?11/08/21 developed rash on L forearm, see photos below. Nonpainful. Nonblanching. WBC and VS WNL, no rash elsewhere.  ? ? ? ?Assessment & Plan: ?  ?Principal Problem: ?  Cellulitis of lower extremity ?Active Problems: ?  Uncontrolled type 2 diabetes  mellitus with hyperglycemia, with long-term current use of insulin ([place]) ?  Chronic diastolic CHF (congestive heart failure) ([place]) ?  PVD (peripheral vascular disease) ([place]) ?  Hyponatremia ?  Tobacco abuse ?  Chronic kidney disease, stage 3a ([place]) ?  Epilepsy, unspecified, not intractable, without status epilepticus ([place]) ?  Anemia ?  Rash and nonspecific skin eruption consistent with small vessel vasculitis, Ddx includes embolic cause  ? ? ?Cellulitis of lower extremity ?Continue current IV antibiotics, ID is consulted, appreciate their recommendations as well ?11/05/21 underwent Excisional debridement left below-knee amputation stump including skin subcutaneous tissues muscle and bone and application of a wound VAC w/ vascular surgery  ? ?Uncontrolled type 2 diabetes mellitus with hyperglycemia, with long-term current use of insulin ([place]) ?SSI/ glycemic protocol. ?Home regimen of synjardy held due to abnormal creatinine and prevention of hypoglycemia. ? ? ?Chronic diastolic CHF (congestive heart failure) ([place]) ?Stable. ?Last echo earlier this month done by dr Nehemiah Massed. ?Cont her on aspirin 81, rosuvastatin, Aldactone. ? ?Hyponatremia ?Continue slow IV fluids ? ? ?Tobacco abuse ?Nicotine patch. ? ?Chronic kidney disease, stage 3a ([place]) ?Creatinine today about baseline, no concern ? ?Epilepsy, unspecified, not intractable, without status epilepticus ([place]) ?Pt is not on AED's. ?Aspiration and seizure precaution.  ? ?Anemia ?Likely due to surgical blood loss ?Patient is status posttransfusion 2 units PRBC on this hospitalization ?Monitor CBC, platelets particularly in light of new rash  ? ? ?PVD (peripheral vascular disease) ([place]) ?Diabetic ulcer right heel, vascular surgery planning for angiography in a few days (11/09/2021) ? ?Rash and nonspecific skin eruption consistent with small vessel vasculitis, Ddx includes embolic cause  ?See photos  ?Concern for vasculitis but strange  it would be localized, I don't think  this would be related to PICC but will try to obtain IV access other arm and remove this line.   ?Concern for Osler nodes on fingertips ?WBC and VS ok, low suspicion for endocarditis but patient certainly has risk factors, will get echocardiogram  ?Rheumatologic labs ordered  ? ? ? ?DVT prophylaxis: Heparin ?Code Status: Full code ?Family Communication: Husband is at bedside ?Disposition Plan: Pending surgical interventions, angiography, see assessment/plan above.  Likely patient will need SNF. ? ? ?Consultants:  ?Vascular surgery ?Infectious disease ? ?Procedures: ?Irrigation and debridement of left below-knee stump, 11/05/2021 ? ? ? ? ? ? ? ? ? ? ? ? ? ? ? ? ? ? ?Antimicrobials:  ?Anti-infectives (From admission, onward)  ? ? Start     Dose/Rate Route Frequency Ordered Stop  ? 11/09/21 0600  ertapenem (INVANZ) 1,000 mg in sodium chloride 0.9 % 100 mL IVPB       ? 1 g ?200 mL/hr over 30 Minutes Intravenous Every 24 hours 11/08/21 1649    ? 11/07/21 1400  ceFEPIme (MAXIPIME) 2 g in sodium chloride 0.9 % 100 mL IVPB       ? 2 g ?200 mL/hr over 30 Minutes Intravenous Every 8 hours 11/07/21 1226 11/08/21 2359  ? 11/06/21 1200  DAPTOmycin (CUBICIN) 500 mg in sodium chloride 0.9 % IVPB       ? 500 mg ?120 mL/hr over 30 Minutes Intravenous Daily 11/05/21 1657    ? 11/05/21 2200  cefTRIAXone (ROCEPHIN) 2 g in sodium chloride 0.9 % 100 mL IVPB  Status:  Discontinued       ? 2 g ?200 mL/hr over 30 Minutes Intravenous Every 24 hours 11/05/21 1657 11/07/21 1217  ? 11/05/21 2200  metroNIDAZOLE (FLAGYL) tablet 500 mg       ? 500 mg Oral Every 12 hours 11/05/21 1657 11/08/21 2359  ? 11/05/21 1100  vancomycin (VANCOCIN) IVPB 1000 mg/200 mL premix       ? 1,000 mg ?200 mL/hr over 60 Minutes Intravenous On call to O.R. 11/05/21 1013 11/05/21 1954  ? 11/05/21 0130  vancomycin IVPB  Status:  Discontinued       ?Note to Pharmacy: Indication:  MRSA bacteremia ?First Dose: Yes ?Last Day of Therapy:  11/07/2021 ?Labs - Monday:  CBC/D, CMP,  and vancomycin trough.  ?Labs - Thursday:  BMP and vancomycin trough  ?Please pull PIC at completion of IV antibiotics  ?Fax labs to [phone] ?Method of administration:Elastomeric ?Metho  ? 1,000 mg Intravenous Every 24 hours 11/05/21 0036 11/05/21 0040  ? 11/04/21 2015  vancomycin (VANCOCIN) IVPB 1000 mg/200 mL premix  Status:  Discontinued       ? 1,000 mg ?200 mL/hr over 60 Minutes Intravenous  Once 11/04/21 2014 11/04/21 2014  ? 11/04/21 1815  vancomycin (VANCOCIN) IVPB 1000 mg/200 mL premix       ? 1,000 mg ?200 mL/hr over 60 Minutes Intravenous  Once 11/04/21 1806 11/04/21 1944  ? ?  ? ? ? ?Subjective: ?Patient seen and examined resting comfortably in bed, she was sleeping when I first entered the room but she wakes easily, husband states she has been overall feeling well, pain is controlled. ? ?Objective: ?Vitals:  ? 11/08/21 0443 11/08/21 0509 11/08/21 0753 11/08/21 1734  ?BP:  (!) 141/66 138/71 (!) 151/72  ?Pulse:  80 81 81  ?Resp:  '20 18 18  '$ ?Temp:  97.8 ?F (36.6 ?C) 98.3 ?F (36.8 ?C) 99.2 ?F (  37.3 ?C)  ?TempSrc:      ?SpO2:  96% 100% 97%  ?Weight: 104.2 kg     ?Height:      ? ? ?Intake/Output Summary (Last 24 hours) at 11/08/2021 1820 ?Last data filed at 11/07/2021 2123 ?Gross per 24 hour  ?Intake 10 ml  ?Output 975 ml  ?Net -965 ml  ? ?Filed Weights  ? 11/04/21 1501 11/07/21 0554 11/08/21 0443  ?Weight: 98 kg 103.4 kg 104.2 kg  ? ? ?Examination: ? ?General exam: Appears calm and comfortable  ?Respiratory system: Clear to auscultation. Respiratory effort normal. ?Cardiovascular system: S1 & S2 heard, RRR. No JVD, murmurs, rubs, gallops or clicks. ?Gastrointestinal system: Abdomen is nondistended, soft and nontender.  ?Central nervous system: Alert and oriented  ?Extremities: Symmetric 5 x 5 power. ?Skin: Dressings were in place and were not disturbed on left stump wound VAC and right heel, vascular is following. See photos for new rash on L forearm  ? ? ? ? ? ? ? ? ? ? ? ?Data Reviewed: I have  personally reviewed following labs and imaging studies ? ?CBC: ?Recent Labs  ?Lab 11/04/21 ?1524 11/04/21 ?2315 11/05/21 ?3875 11/06/21 ?1008 11/07/21 ?0559 11/08/21 ?0431  ?WBC 11.1* 9.6 10.6* 10.1 8.6 7.7  ?NEUTROA

## 2021-11-08 NOTE — Progress Notes (Signed)
Inpatient Diabetes Program Recommendations ? ?AACE/ADA: New Consensus Statement on Inpatient Glycemic Control  ? ?Target Ranges:  Prepandial:   less than 140 mg/dL ?     Peak postprandial:   less than 180 mg/dL (1-2 hours) ?     Critically ill patients:  140 - 180 mg/dL  ? ? Latest Reference Range & Units 11/08/21 00:05 11/08/21 05:20 11/08/21 07:54  ?Glucose-Capillary 70 - 99 mg/dL 263 (H) 288 (H) 274 (H)  ? ? Latest Reference Range & Units 11/07/21 05:57 11/07/21 08:48 11/07/21 12:31 11/07/21 18:35  ?Glucose-Capillary 70 - 99 mg/dL 256 (H) 283 (H) 323 (H) 275 (H)  ? ?Review of Glycemic Control ? ?Diabetes history: DM2 ?Outpatient Diabetes medications: Lantus 35 units daily, Novolog 1-9 units QID, Synjardy 25/1000 mg daily ?Current orders for Inpatient glycemic control: Novolog 0-15 units TID with meals ? ?Inpatient Diabetes Program Recommendations:   ? ?Insulin: Please consider ordering Semglee 20 units Q24H starting now. ? ?NOTE: Patient admitted on 11/04/21 with cellulitis. Patient with frequent hospital admissions; last inpatient 10/07/21-10/14/21. Patient was last counseled by inpatient diabetes coordinator on 09/21/21 during prior admission. Patient had reported "She has been taking Humulin R U500 insulin but states that her surgeon (Dr. Bary Castilla) told her that she needs to get on something other than insulin for diabetes after she has an abdominal abscess that required I&D on 07/21/21." Patient was seeing Dr. Honor Junes (Endocrinologist) but has not seen since 12/03/20. Patient was encouraged to follow up with Dr. Honor Junes but no office visits noted in chart since then.  ? ?Thanks, ?Barnie Alderman, RN, MSN, CDE ?Diabetes Coordinator ?Inpatient Diabetes Program ?[phone] (Team Pager from 8am to 5pm) ? ?

## 2021-11-08 NOTE — Progress Notes (Signed)
ID ?Pt is sleeping ?Husband not at bed side ? ?O/e sleeping ?Patient Vitals for the past 24 hrs: ? BP Temp Temp src Pulse Resp SpO2 Weight  ?11/08/21 2020 135/65 98.1 ?F (36.7 ?C) -- 75 16 98 % --  ?11/08/21 1734 (!) 151/72 99.2 ?F (37.3 ?C) -- 81 18 97 % --  ?11/08/21 0753 138/71 98.3 ?F (36.8 ?C) -- 81 18 100 % --  ?11/08/21 0509 (!) 141/66 97.8 ?F (36.6 ?C) -- 80 20 96 % --  ?11/08/21 0443 -- -- -- -- -- -- 104.2 kg  ?11/08/21 0442 (!) 147/71 98.6 ?F (37 ?C) Oral 80 20 98 % --  ?  ?Wound vac left stump ?Rt heel wound with eschar ? ? ?Labs ? ?  Latest Ref Rng & Units 11/08/2021  ?  7:28 PM 11/08/2021  ?  4:31 AM 11/07/2021  ?  5:59 AM  ?CBC  ?WBC 4.0 - 10.5 K/uL 8.2   7.7   8.6    ?Hemoglobin 12.0 - 15.0 g/dL 7.3   7.1   7.1    ?Hematocrit 36.0 - 46.0 % 24.9   24.3   23.3    ?Platelets 150 - 400 K/uL 327   300   301    ?  ? ?  Latest Ref Rng & Units 11/08/2021  ?  7:28 PM 11/08/2021  ?  4:31 AM 11/07/2021  ?  5:18 AM  ?CMP  ?Glucose 70 - 99 mg/dL 281   254   200    ?BUN 6 - 20 mg/dL 32   30   17    ?Creatinine 0.44 - 1.00 mg/dL 1.20   1.12   1.00    ?Sodium 135 - 145 mmol/L 130   133   130    ?Potassium 3.5 - 5.1 mmol/L 5.2   5.3   4.2    ?Chloride 98 - 111 mmol/L 96   100   102    ?CO2 22 - 32 mmol/L '23   27   21    '$ ?Calcium 8.9 - 10.3 mg/dL 8.0   8.3   7.7    ?Total Protein 6.5 - 8.1 g/dL 8.2      ?Total Bilirubin 0.3 - 1.2 mg/dL 0.6      ?Alkaline Phos 38 - 126 U/L 148      ?AST 15 - 41 U/L 13      ?ALT 0 - 44 U/L 8      ?  ? ?Micro ?Wound culture from left washout-enterobacter ?Rt heel wound culture- kelb, enterobacter and staph  ? ? ?Impression/recommendation ?Left BKA stump infection status post debridement and placement of wound VAC ?Cultures having Enterobacter ?Currently on daptomycin, ceftriaxone and Flagyl ?We will change the latter 2 to ertapenem ? ?Right heel unstageable wound with eschar.  This is growing multiple organisms including Klebsiella Enterobacter and Staphylococcus ?Needs debridement/BKA ?On  daptomycin and start ertapenem ? ?Diabetes mellitus on insulin ? ?Severe deconditioning ? ?CAP status post right mastectomy on anastrozole ? ?Severe anemia has received PRBC ? ?Anxiety/depression on BuSpar, citalopram, desvenlafaxine and trazodone ? ?Discussed with the pharmacist. ? ?

## 2021-11-09 ENCOUNTER — Encounter
Admission: EM | Disposition: A | Discharge status: Home Health | Payer: Self-pay | Source: Home / Self Care | Attending: Internal Medicine

## 2021-11-09 ENCOUNTER — Inpatient Hospital Stay (HOSPITAL_COMMUNITY)
Admit: 2021-11-09 | Discharge: 2021-11-09 | Disposition: A | Discharge status: Home / Self Care | Payer: BC Managed Care – PPO | Attending: Osteopathic Medicine | Admitting: Osteopathic Medicine

## 2021-11-09 DIAGNOSIS — I38 Endocarditis, valve unspecified: Secondary | ICD-10-CM

## 2021-11-09 DIAGNOSIS — I70261 Atherosclerosis of native arteries of extremities with gangrene, right leg: Secondary | ICD-10-CM

## 2021-11-09 DIAGNOSIS — L03119 Cellulitis of unspecified part of limb: Secondary | ICD-10-CM | POA: Diagnosis not present

## 2021-11-09 DIAGNOSIS — L97419 Non-pressure chronic ulcer of right heel and midfoot with unspecified severity: Secondary | ICD-10-CM | POA: Diagnosis not present

## 2021-11-09 DIAGNOSIS — E08621 Diabetes mellitus due to underlying condition with foot ulcer: Secondary | ICD-10-CM

## 2021-11-09 DIAGNOSIS — I5032 Chronic diastolic (congestive) heart failure: Secondary | ICD-10-CM | POA: Diagnosis not present

## 2021-11-09 DIAGNOSIS — T8754 Necrosis of amputation stump, left lower extremity: Secondary | ICD-10-CM | POA: Diagnosis not present

## 2021-11-09 DIAGNOSIS — T8740 Infection of amputation stump, unspecified extremity: Secondary | ICD-10-CM | POA: Diagnosis not present

## 2021-11-09 DIAGNOSIS — I700 Atherosclerosis of aorta: Secondary | ICD-10-CM | POA: Diagnosis not present

## 2021-11-09 DIAGNOSIS — L03116 Cellulitis of left lower limb: Secondary | ICD-10-CM | POA: Diagnosis not present

## 2021-11-09 DIAGNOSIS — N1831 Chronic kidney disease, stage 3a: Secondary | ICD-10-CM | POA: Diagnosis not present

## 2021-11-09 HISTORY — PX: LOWER EXTREMITY ANGIOGRAPHY: CATH118251

## 2021-11-09 LAB — CBC
HCT: 24.2 % — ABNORMAL LOW (ref 36.0–46.0)
Hemoglobin: 6.9 g/dL — ABNORMAL LOW (ref 12.0–15.0)
MCH: 23.5 pg — ABNORMAL LOW (ref 26.0–34.0)
MCHC: 28.5 g/dL — ABNORMAL LOW (ref 30.0–36.0)
MCV: 82.3 fL (ref 80.0–100.0)
Platelets: 311 10*3/uL (ref 150–400)
RBC: 2.94 MIL/uL — ABNORMAL LOW (ref 3.87–5.11)
RDW: 18.3 % — ABNORMAL HIGH (ref 11.5–15.5)
WBC: 8 10*3/uL (ref 4.0–10.5)
nRBC: 0 % (ref 0.0–0.2)

## 2021-11-09 LAB — CULTURE, BLOOD (ROUTINE X 2)
Culture: NO GROWTH
Culture: NO GROWTH
Special Requests: ADEQUATE
Special Requests: ADEQUATE

## 2021-11-09 LAB — ECHOCARDIOGRAM COMPLETE
AR max vel: 1.82 cm2
AV Area VTI: 2.03 cm2
AV Area mean vel: 1.73 cm2
AV Mean grad: 6 mmHg
AV Peak grad: 10.3 mmHg
Ao pk vel: 1.6 m/s
Area-P 1/2: 5.62 cm2
Height: 64 in
MV VTI: 1.68 cm2
S' Lateral: 2.2 cm
Weight: 3721.36 oz

## 2021-11-09 LAB — AEROBIC CULTURE W GRAM STAIN (SUPERFICIAL SPECIMEN): Gram Stain: NONE SEEN

## 2021-11-09 LAB — BASIC METABOLIC PANEL
Anion gap: 8 (ref 5–15)
BUN: 35 mg/dL — ABNORMAL HIGH (ref 6–20)
CO2: 24 mmol/L (ref 22–32)
Calcium: 7.7 mg/dL — ABNORMAL LOW (ref 8.9–10.3)
Chloride: 98 mmol/L (ref 98–111)
Creatinine, Ser: 1.27 mg/dL — ABNORMAL HIGH (ref 0.44–1.00)
GFR, Estimated: 51 mL/min — ABNORMAL LOW (ref >60)
Glucose, Bld: 266 mg/dL — ABNORMAL HIGH (ref 70–99)
Potassium: 5.5 mmol/L — ABNORMAL HIGH (ref 3.5–5.1)
Sodium: 130 mmol/L — ABNORMAL LOW (ref 135–145)

## 2021-11-09 LAB — PREPARE RBC (CROSSMATCH)

## 2021-11-09 LAB — HEPATITIS PANEL, ACUTE
HCV Ab: NONREACTIVE
Hep A IgM: NONREACTIVE
Hep B C IgM: NONREACTIVE
Hepatitis B Surface Ag: NONREACTIVE

## 2021-11-09 LAB — GLUCOSE, CAPILLARY
Glucose-Capillary: 229 mg/dL — ABNORMAL HIGH (ref 70–99)
Glucose-Capillary: 291 mg/dL — ABNORMAL HIGH (ref 70–99)
Glucose-Capillary: 307 mg/dL — ABNORMAL HIGH (ref 70–99)

## 2021-11-09 LAB — RETIC PANEL
Immature Retic Fract: 33.3 % — ABNORMAL HIGH (ref 2.3–15.9)
RBC.: 2.93 MIL/uL — ABNORMAL LOW (ref 3.87–5.11)
Retic Count, Absolute: 103.4 10*3/uL (ref 19.0–186.0)
Retic Ct Pct: 3.5 % — ABNORMAL HIGH (ref 0.4–3.1)
Reticulocyte Hemoglobin: 22.3 pg — ABNORMAL LOW (ref >27.9)

## 2021-11-09 LAB — IRON AND TIBC
Iron: 24 ug/dL — ABNORMAL LOW (ref 28–170)
Saturation Ratios: 10 % — ABNORMAL LOW (ref 10.4–31.8)
TIBC: 235 ug/dL — ABNORMAL LOW (ref 250–450)
UIBC: 211 ug/dL

## 2021-11-09 LAB — FERRITIN: Ferritin: 87 ng/mL (ref 11–307)

## 2021-11-09 LAB — PATHOLOGIST SMEAR REVIEW

## 2021-11-09 LAB — SURGICAL PATHOLOGY

## 2021-11-09 SURGERY — LOWER EXTREMITY ANGIOGRAPHY
Anesthesia: Moderate Sedation | Laterality: Right

## 2021-11-09 MED ORDER — MIDAZOLAM HCL 2 MG/2ML IJ SOLN
INTRAMUSCULAR | Status: AC
  Filled 2021-11-09: qty 2

## 2021-11-09 MED ORDER — FENTANYL CITRATE PF 50 MCG/ML IJ SOSY
PREFILLED_SYRINGE | INTRAMUSCULAR | Status: AC
  Filled 2021-11-09: qty 1

## 2021-11-09 MED ORDER — SODIUM CHLORIDE 0.9% IV SOLUTION: Freq: Once | INTRAVENOUS | Status: AC

## 2021-11-09 MED ORDER — MIDAZOLAM HCL 2 MG/2ML IJ SOLN
INTRAMUSCULAR | Status: DC | PRN
  Administered 2021-11-09 (×3): 1 mg via INTRAVENOUS

## 2021-11-09 MED ORDER — HEPARIN SODIUM (PORCINE) 1000 UNIT/ML IJ SOLN
INTRAMUSCULAR | Status: AC
  Filled 2021-11-09: qty 10

## 2021-11-09 MED ORDER — FENTANYL CITRATE (PF) 100 MCG/2ML IJ SOLN
INTRAMUSCULAR | Status: DC | PRN
Start: 2021-11-09 — End: 2021-11-09
  Administered 2021-11-09: 50 ug via INTRAVENOUS
  Administered 2021-11-09 (×2): 25 ug via INTRAVENOUS

## 2021-11-09 SURGICAL SUPPLY — 18 items
BALLN LUTONIX DCB 5X60X130 (BALLOONS) ×2
BALLOON LUTONIX DCB 5X60X130 (BALLOONS) IMPLANT
CANNULA 5F STIFF (CANNULA) ×1 IMPLANT
CATH ANGIO 5F PIGTAIL 65CM (CATHETERS) ×1 IMPLANT
CATH BEACON 5 .038 100 VERT TP (CATHETERS) ×1 IMPLANT
COVER PROBE U/S 5X48 (MISCELLANEOUS) ×1 IMPLANT
DEVICE STARCLOSE SE CLOSURE (Vascular Products) ×1 IMPLANT
DRSG VAC ATS MED SENSATRAC (GAUZE/BANDAGES/DRESSINGS) ×1 IMPLANT
GAUZE SPONGE 4X4 12PLY STRL (GAUZE/BANDAGES/DRESSINGS) ×2 IMPLANT
GLIDEWIRE ADV .035X260CM (WIRE) ×1 IMPLANT
KIT ENCORE 26 ADVANTAGE (KITS) ×1 IMPLANT
PACK ANGIOGRAPHY (CUSTOM PROCEDURE TRAY) ×2 IMPLANT
SHEATH BRITE TIP 5FRX11 (SHEATH) ×1 IMPLANT
SHEATH RAABE 6FR (SHEATH) ×1 IMPLANT
STENT LIFESTENT 5F 6X60X135 (Permanent Stent) ×1 IMPLANT
SYR MEDRAD MARK 7 150ML (SYRINGE) ×1 IMPLANT
TUBING CONTRAST HIGH PRESS 72 (TUBING) ×1 IMPLANT
WIRE GUIDERIGHT .035X150 (WIRE) ×1 IMPLANT

## 2021-11-09 NOTE — Interval H&P Note (Signed)
History and Physical Interval Note: ? ?11/09/2021 ?12:27 PM ? ?[name]  has presented today for surgery, with the diagnosis of atherosclerosis with gangrene.  The various methods of treatment have been discussed with the patient and family. After consideration of risks, benefits and other options for treatment, the patient has consented to  Procedure(s): ?Lower Extremity Angiography (Right) as a surgical intervention.  The patient's history has been reviewed, patient examined, no change in status, stable for surgery.  I have reviewed the patient's chart and labs.  Questions were answered to the patient's satisfaction.   ? ? ?[name] ? ? ?

## 2021-11-09 NOTE — Progress Notes (Signed)
Inpatient Diabetes Program Recommendations ? ?AACE/ADA: New Consensus Statement on Inpatient Glycemic Control  ? ?Target Ranges:  Prepandial:   less than 140 mg/dL ?     Peak postprandial:   less than 180 mg/dL (1-2 hours) ?     Critically ill patients:  140 - 180 mg/dL  ? ? Latest Reference Range & Units 11/08/21 07:54 11/08/21 12:26 11/08/21 17:07 11/09/21 00:30 11/09/21 05:44  ?Glucose-Capillary 70 - 99 mg/dL 274 (H) 278 (H) 263 (H) 307 (H) 291 (H)  ? ?Review of Glycemic Control ? ?Diabetes history: DM2 ?Outpatient Diabetes medications: Lantus 35 units daily, Novolog 1-9 units QID, Synjardy 25/1000 mg daily ?Current orders for Inpatient glycemic control:  ?Novolog 0-15 units TID with meals ?Semglee 20 units Daily ? ?Inpatient Diabetes Program Recommendations:   ? ?-   Consider increasing Semglee to 28 units  ?-   Add Novolog 4 units tid meal coverage if eating >50% of meals ? ?Thanks, ?Tama Headings RN, MSN, BC-ADM ?Inpatient Diabetes Coordinator ?Team Pager [phone] (8a-5p) ? ? ?

## 2021-11-09 NOTE — Progress Notes (Signed)
?PROGRESS NOTE ? ? ? ?[name]  OEV:[id number] DOB: [birth year] \ ?DOA: 11/04/2021 ?PCP: Pleas Koch, NP ?Date of service: 11/09/21 today ? ? ?Hospital Course:  ?[name] is a 51 year old Caucasian female with PMH CHF, CAD, DM 2, tobacco use, presenting with concern for wound infection on surgical site, she is status post BKA on 10/11/2021, currently being followed at home by wound care nurse.  Patient presented to the ED 11/04/2021 reports purulent drainage, warmth, pain at surgical site, on left stump, as well as wound on right heel.  Admitted for IV antibiotics and surgical consult for treatment of cellulitis of lower extremity.  Of note, last echocardiogram earlier this month showed LVEF 55 to 60%. ?11/05/21: Seen by vascular, agree with concern for wound infection particularly for possibility of underlying abscess, planning to take patient to the OR for debridement and possible wound VAC placement, also recommending infectious disease consult.  For diabetic ulcer on right heel, progressing toward ankle, planning for angiogram on Tuesday 11/09/21.  Infectious disease consulted. Underwent Excisional debridement left below-knee amputation stump including skin subcutaneous tissues muscle and bone, and Application of a wound VAC. Pt was having difficulty voiding, Foley was ordered.  ?11/06/21 wound VAC in place, bandages in place on right heel.  Sodium continues to be slightly low but corrects to 130 given hyperglycemia.  Hemoglobin yesterday 8.0, 7.4 today, expected drop likely due to surgery, can continue to monitor ?11/07/21 Hgb 7.1, sodium corrects to 132, pt resting today, anticipating angiography w/ vascular surgery.  WBC continuing to trend down ?11/08/21 developed rash on L forearm, see photos below. Nonpainful. Nonblanching. WBC and VS WNL, no rash elsewhere. She states she has had similar rash bilateral arms int the past not sure when.  ?11/09/21: rash same, no fever, slight drop Hgb. To OR today  RLE angiography. Echo pending to eval for endocarditis as cause of vasculitis rash (possible emboli?)  ? ? ? ?Assessment & Plan: ?  ?Principal Problem: ?  Cellulitis of lower extremity ?Active Problems: ?  Uncontrolled type 2 diabetes mellitus with hyperglycemia, with long-term current use of insulin ([place]) ?  Chronic diastolic CHF (congestive heart failure) ([place]) ?  PVD (peripheral vascular disease) ([place]) ?  Hyponatremia ?  Tobacco abuse ?  Chronic kidney disease, stage 3a ([place]) ?  Epilepsy, unspecified, not intractable, without status epilepticus ([place]) ?  Anemia ?  Rash and nonspecific skin eruption consistent with small vessel vasculitis, Ddx includes embolic cause  ? ? ?Cellulitis of lower extremity ?Continue current IV antibiotics, ID is consulted, appreciate their recommendations as well ?11/05/21 underwent Excisional debridement left below-knee amputation stump including skin subcutaneous tissues muscle and bone and application of a wound VAC w/ vascular surgery  ? ?Uncontrolled type 2 diabetes mellitus with hyperglycemia, with long-term current use of insulin ([place]) ?SSI/ glycemic protocol. ?Home regimen of synjardy held due to abnormal creatinine and prevention of hypoglycemia. ? ? ?Chronic diastolic CHF (congestive heart failure) ([place]) ?Stable. ?Last echo earlier this month done by dr Nehemiah Massed. ?Cont her on aspirin 81, rosuvastatin, Aldactone. ? ?Hyponatremia ?Continue slow IV fluids ? ? ?Tobacco abuse ?Nicotine patch. ? ?Chronic kidney disease, stage 3a ([place]) ?Creatinine today about baseline, no concern ? ?Epilepsy, unspecified, not intractable, without status epilepticus ([place]) ?Pt is not on AED's. ?Aspiration and seizure precaution.  ? ?Anemia ?Likely due to surgical blood loss ?Patient is status posttransfusion 2 units PRBC on this hospitalization ?Monitor CBC, platelets particularly in light of new rash  ? ? ?PVD (peripheral  vascular disease) ([place]) ?Diabetic ulcer right heel, vascular surgery planning  for angiography RLE 11/09/2021 ? ?Rash and nonspecific skin eruption consistent with small vessel vasculitis, Ddx includes embolic cause  ?See photos  ?Concern for vasculitis but unusual it would be localized, I don't think this would be related to PICC, unable to botain access in other arm w/ hx breast CA ?Concern for Os[place] nodes on fingertips ?WBC and VS ok, low suspicion for endocarditis but patient certainly has risk factors, will get echocardiogram  ?Rheumatologic labs ordered and pending  ?Rash is stable 05/02 today  ? ? ? ?DVT prophylaxis: Heparin ?Code Status: Full code ?Family Communication: Husband is at bedside ?Disposition Plan: Pending surgical interventions, angiography, see assessment/plan above.  Likely patient will need SNF. ? ? ?Consultants:  ?Vascular surgery ?Infectious disease ? ?Procedures: ?Irrigation and debridement of left below-knee stump, 11/05/2021 ?(Planned RLE angiography 11/09/2021) ? ? ? ? ? ? ? ? ? ? ? ? ? ? ? ? ? ? ?Antimicrobials:  ?Anti-infectives (From admission, onward)  ? ? Start     Dose/Rate Route Frequency Ordered Stop  ? 11/09/21 0600  ertapenem (INVANZ) 1,000 mg in sodium chloride 0.9 % 100 mL IVPB       ? 1 g ?200 mL/hr over 30 Minutes Intravenous Every 24 hours 11/08/21 1649    ? 11/07/21 1400  ceFEPIme (MAXIPIME) 2 g in sodium chloride 0.9 % 100 mL IVPB       ? 2 g ?200 mL/hr over 30 Minutes Intravenous Every 8 hours 11/07/21 1226 11/08/21 2322  ? 11/06/21 1200  DAPTOmycin (CUBICIN) 500 mg in sodium chloride 0.9 % IVPB       ? 500 mg ?120 mL/hr over 30 Minutes Intravenous Daily 11/05/21 1657    ? 11/05/21 2200  cefTRIAXone (ROCEPHIN) 2 g in sodium chloride 0.9 % 100 mL IVPB  Status:  Discontinued       ? 2 g ?200 mL/hr over 30 Minutes Intravenous Every 24 hours 11/05/21 1657 11/07/21 1217  ? 11/05/21 2200  metroNIDAZOLE (FLAGYL) tablet 500 mg       ? 500 mg Oral Every 12 hours 11/05/21 1657 11/08/21 2233  ? 11/05/21 1100  vancomycin (VANCOCIN) IVPB 1000 mg/200 mL premix        ? 1,000 mg ?200 mL/hr over 60 Minutes Intravenous On call to O.R. 11/05/21 1013 11/05/21 1954  ? 11/05/21 0130  vancomycin IVPB  Status:  Discontinued       ?Note to Pharmacy: Indication:  MRSA bacteremia ?First Dose: Yes ?Last Day of Therapy:  11/07/2021 ?Labs - Monday:  CBC/D, CMP, and vancomycin trough.  ?Labs - Thursday:  BMP and vancomycin trough  ?Please pull PIC at completion of IV antibiotics  ?Fax labs to [phone] ?Method of administration:Elastomeric ?Metho  ? 1,000 mg Intravenous Every 24 hours 11/05/21 0036 11/05/21 0040  ? 11/04/21 2015  vancomycin (VANCOCIN) IVPB 1000 mg/200 mL premix  Status:  Discontinued       ? 1,000 mg ?200 mL/hr over 60 Minutes Intravenous  Once 11/04/21 2014 11/04/21 2014  ? 11/04/21 1815  vancomycin (VANCOCIN) IVPB 1000 mg/200 mL premix       ? 1,000 mg ?200 mL/hr over 60 Minutes Intravenous  Once 11/04/21 1806 11/04/21 1944  ? ?  ? ? ? ?Subjective: ?Patient seen and examined resting comfortably in bed, alert and awake, husband present. She states she thinks rash on arm is better, she's not really concerned about this rash since she's had  it before.  ? ?Objective: ?Vitals:  ? 11/09/21 1330 11/09/21 1345 11/09/21 1400 11/09/21 1520  ?BP: 119/69 125/67 114/68 (!) 115/56  ?Pulse: 73 73 71 73  ?Resp: '14 12 11 18  '$ ?Temp:    97.9 ?F (36.6 ?C)  ?TempSrc:    Oral  ?SpO2:  94% 96% 97%  ?Weight:      ?Height:      ? ? ?Intake/Output Summary (Last 24 hours) at 11/09/2021 1644 ?Last data filed at 11/09/2021 1400 ?Gross per 24 hour  ?Intake --  ?Output 2450 ml  ?Net -2450 ml  ? ? ?Filed Weights  ? 11/07/21 0554 11/08/21 0443 11/09/21 0500  ?Weight: 103.4 kg 104.2 kg 105.5 kg  ? ? ?Examination: ? ?General exam: Appears calm and comfortable  ?Respiratory system: Clear to auscultation. Respiratory effort normal. ?Cardiovascular system: S1 & S2 heard, RRR.  ?Gastrointestinal system: Abdomen is nondistended, soft and nontender.  ?Central nervous system: Alert and oriented  ?Extremities:  Symmetric 5 x 5 power. ?Skin: Dressings were in place and were not disturbed on left stump wound VAC and right heel, vascular is following. See photos for rash on L forearm  ? ?Rash 11/08/2021 appears sam

## 2021-11-09 NOTE — Progress Notes (Signed)
ID ?Patient is awake.  Has slow  speech.  Asking for pain meds ? ?Awake and responding appropriately to questions ?Patient Vitals for the past 24 hrs: ? BP Temp Temp src Pulse Resp SpO2 Weight  ?11/09/21 1520 (!) 115/56 97.9 ?F (36.6 ?C) Oral 73 18 97 % --  ?11/09/21 1400 114/68 -- -- 71 11 96 % --  ?11/09/21 1345 125/67 -- -- 73 12 94 % --  ?11/09/21 1330 119/69 -- -- 73 14 -- --  ?11/09/21 1322 (!) 153/74 -- -- 80 14 -- --  ?11/09/21 1317 (!) 151/84 -- -- 78 13 95 % --  ?11/09/21 1312 129/71 -- -- 76 12 95 % --  ?11/09/21 1307 121/73 -- -- 75 12 95 % --  ?11/09/21 1302 138/76 -- -- 75 13 94 % --  ?11/09/21 1257 139/76 -- -- 75 13 95 % --  ?11/09/21 1252 117/72 -- -- 73 13 95 % --  ?11/09/21 1247 130/74 -- -- 72 11 99 % --  ?11/09/21 1242 137/79 -- -- 73 11 99 % --  ?11/09/21 1237 132/80 -- -- 75 13 98 % --  ?11/09/21 1235 136/76 -- -- 72 18 99 % --  ?11/09/21 1233 136/76 -- -- 74 12 97 % --  ?11/09/21 1232 -- -- -- -- -- 97 % --  ?11/09/21 1116 124/71 97.9 ?F (36.6 ?C) Oral 75 10 96 % --  ?11/09/21 0834 122/66 98 ?F (36.7 ?C) Oral 70 18 100 % --  ?11/09/21 0548 128/63 98.1 ?F (36.7 ?C) -- 75 20 98 % --  ?11/09/21 0500 -- -- -- -- -- -- 105.5 kg  ?11/09/21 0028 117/62 98 ?F (36.7 ?C) -- 68 14 98 % --  ?11/08/21 2020 135/65 98.1 ?F (36.7 ?C) -- 75 16 98 % --  ?11/08/21 1734 (!) 151/72 99.2 ?F (37.3 ?C) -- 81 18 97 % --  ?Chest bilateral air entry ?Heart sound S1-S2  ?abdomen soft ?Wound vac left stump ?Rt heel wound with eschar ?Petechial looking lesions on the left arm ? ?11/09/21 ? ? ? ?11/08/21 ? ? ? ? ? ?Labs ? ?  Latest Ref Rng & Units 11/09/2021  ?  3:16 AM 11/08/2021  ?  7:28 PM 11/08/2021  ?  4:31 AM  ?CBC  ?WBC 4.0 - 10.5 K/uL 8.0   8.2   7.7    ?Hemoglobin 12.0 - 15.0 g/dL 6.9   7.3   7.1    ?Hematocrit 36.0 - 46.0 % 24.2   24.9   24.3    ?Platelets 150 - 400 K/uL 311   327   300    ?  ? ?  Latest Ref Rng & Units 11/09/2021  ?  3:16 AM 11/08/2021  ?  7:28 PM 11/08/2021  ?  4:31 AM  ?CMP  ?Glucose 70 - 99 mg/dL 266   281    254    ?BUN 6 - 20 mg/dL 35   32   30    ?Creatinine 0.44 - 1.00 mg/dL 1.27   1.20   1.12    ?Sodium 135 - 145 mmol/L 130   130   133    ?Potassium 3.5 - 5.1 mmol/L 5.5   5.2   5.3    ?Chloride 98 - 111 mmol/L 98   96   100    ?CO2 22 - 32 mmol/L '24   23   27    '$ ?Calcium 8.9 - 10.3 mg/dL 7.7   8.0  8.3    ?Total Protein 6.5 - 8.1 g/dL  8.2     ?Total Bilirubin 0.3 - 1.2 mg/dL  0.6     ?Alkaline Phos 38 - 126 U/L  148     ?AST 15 - 41 U/L  13     ?ALT 0 - 44 U/L  8     ?  ? ?Micro ?Wound culture from left washout-enterobacter ?Rt heel wound culture- kelb, enterobacter and staph  ? ? 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   ? ? ?Right heel unstageable wound with eschar.  This is growing multiple organisms including Klebsiella Enterobacter and MRSA  ?On x-ray there is Tongue-type posterior calcaneus fracture with severe displacement and extension of the fracture line towards the posterior subtalar joint. Very likely will end up with BKA as debridement may not be possible with underlying bone involvement ?underwent angio today and had angioplasty of the superficial femoral artery on the right side with stent placement. ? ?on daptomycin and ertapenem ? ?Diabetes mellitus on insulin ? ?Severe deconditioning ? ?CAP status post right mastectomy on anastrozole ? ?Severe anemia has received PRBC ? ?Anxiety/depression on BuSpar, citalopram, desvenlafaxine and trazodone ? ?Discussed with patient and the pharmacist ?She is going to need IV antibiotics on discharge ? ? ?

## 2021-11-09 NOTE — Interval H&P Note (Signed)
History and Physical Interval Note: ? ?11/09/2021 ?10:24 AM ? ?[name]  has presented today for surgery, with the diagnosis of atherosclerosis with gangrene.  The various methods of treatment have been discussed with the patient and family. After consideration of risks, benefits and other options for treatment, the patient has consented to  Procedure(s): ?Lower Extremity Angiography (Right) as a surgical intervention.  The patient's history has been reviewed, patient examined, no change in status, stable for surgery.  I have reviewed the patient's chart and labs.  Questions were answered to the patient's satisfaction.   ? ? ?[name] ? ? ?

## 2021-11-09 NOTE — Progress Notes (Signed)
*  PRELIMINARY RESULTS* ?Echocardiogram ?2D Echocardiogram has been performed. ? ?[name], [name] ?11/09/2021, 2:36 PM ?

## 2021-11-09 NOTE — Op Note (Addendum)
[facility] ? Percutaneous Study/Intervention Procedural Note ? ? ?Date of Surgery: 11/09/2021 ? ?Surgeon:  Katha Cabal, MD. ? ?Pre-operative Diagnosis: Atherosclerotic occlusive disease bilateral lower extremities with gangrene of the right heel ? ?Post-operative diagnosis:  Same ? ?Procedure(s) Performed: ?            1.  Introduction catheter into right lower extremity 3rd order catheter placement  ?            2.    Contrast injection right lower extremity for distal runoff ?            3.  Percutaneous transluminal angioplasty and stent placement right superficial femoral artery. ?             4.  Star close closure left common femoral arteriotomy. ?             5.  VAC wound dressing change left below-knee amputation stump ? ?Anesthesia: Conscious sedation was administered under my direct supervision by the interventional radiology RN. IV Versed plus fentanyl were utilized. Continuous ECG, pulse oximetry and blood pressure was monitored throughout the entire procedure.  Conscious sedation was for a total of 43 minutes. ? ?Sheath: 6 [place] Rabie left common femoral retrograde ? ?Contrast: 55 cc ? ?Fluoroscopy Time: 4.6 minutes ? ?Indications:  [name] presents with a left below-knee amputation stump infection.  At the time of initial evaluation the risks and benefits are reviewed all questions answered patient agrees to proceed. ? ?Procedure:  [name] is a 51 y.o. y.o. female who was identified and appropriate procedural time out was performed.  The patient was then placed supine on the table and prepped and draped in the usual sterile fashion.   ? ?Ultrasound was placed in the sterile sleeve and the left groin was evaluated the left common femoral artery was echolucent and pulsatile indicating patency.  Image was recorded for the permanent record and under real-time visualization a microneedle was inserted into the common femoral artery microwire followed by a  micro-sheath.  A J-wire was then advanced through the micro-sheath and a  5 [place] sheath was then inserted over a J-wire. J-wire was then advanced and a 5 French pigtail catheter was positioned at the level of T12. AP projection of the aorta was then obtained. Pigtail catheter was repositioned to above the bifurcation and a LAO view of the pelvis was obtained.  Subsequently a pigtail catheter with the stiff angle Glidewire was used to cross the aortic bifurcation the catheter wire were advanced down into the right distal external iliac artery. Oblique view of the femoral bifurcation was then obtained and subsequently the wire was reintroduced and the pigtail catheter negotiated into the SFA representing third order catheter placement. Distal runoff was then performed. ? ?Diagnostic interpretation: ?The abdominal aorta is opacified with a bolus injection contrast.  In review of the previous images from March there are several large gas bubbles which obscure the mid infrarenal aorta and aortic bifurcation.  Therefore I felt compelled to restudy the aorta iliac system.  There is diffuse calcific atherosclerotic changes of the abdominal aorta but there are no focal hemodynamically significant lesions noted.  Single renal arteries are noted with normal nephrograms.  No evidence of renal artery stenosis.  The aortic bifurcation demonstrates increasing calcific disease but it is patent the right common iliac, internal iliac and external iliac demonstrate increasing calcific disease but there are no focal hemodynamically significant lesions.  The left common  iliac demonstrates a 40% stenosis from its origin extending approximately 2 to 3 cm in length.  Distally there are no hemodynamically significant lesions in the iliac bifurcation, internal iliac or external iliac arteries ? ?The right common femoral profunda femoris are diffusely calcified but no hemodynamically significant lesions are present.  The right superficial  femoral artery demonstrates increasing calcific disease which progresses to an 80 to 85% stenosis at Hunter's canal extending over a distance of approximately 30 mm in length.  Distal to this lesion the popliteal artery is diffusely calcified but free of hemodynamically significant lesions.  The trifurcation is patent and there is 3 vessel runoff to the foot with the posterior tibial being the dominant vessel.  The tibial arteries are diffusely calcified but there are no flow-limiting stenoses noted.  The posterior tibial fills the heel quite nicely and also fills the pedal arch.  The anterior tibial artery is patent all the way down filling the dorsalis pedis which also contributes to the pedal arch. ? ?5000 units of heparin was then given and allowed to circulate and a 6 [place] Rabie sheath was advanced up and over the bifurcation and positioned in the femoral artery ? ?KMP  catheter and advantage wire were then negotiated down into the distal popliteal.  The lesion at Hunter's canal was crossed.  A 6 mm x 60 mm life stent was then deployed across the lesion and postdilated with a 5 mm x 60 mm Lutonix drug-eluting balloon inflated to 12 atm for 1 full minute.  Follow-up imaging demonstrated patency with less than 10% residual stenosis. Distal runoff was then reassessed and found to be unchanged. ? ?After review of these images the sheath is pulled into the left external iliac oblique of the common femoral is obtained and a Star close device deployed. There no immediate complications. ? ?Having completed the revascularization of the right lower extremity and now turned my attention to the [facility] dressing of the left below-knee amputation stump.  The patient was given a small dose of extra sedation and the existing VAC dressing was removed.  Inspection shows the tissues to be healthy with patchy areas of granulation tissue.  Also upon removing the sponge bleeding from the deep tissues was again noted in multiple  locations as expected. ? ?A new black VAC sponge was then trimmed to the appropriate shape and a dressing applied with the provided materials.  Suction was once again initiated and found to have an excellent seal.  VAC will continue at 125 mmHg continuous suction. ? ? ?Findings:   ?The abdominal aorta is opacified with a bolus injection contrast.  In review of the previous images from March there are several large gas bubbles which obscure the mid infrarenal aorta and aortic bifurcation.  Therefore I felt compelled to restudy the aorta iliac system.  There is diffuse calcific atherosclerotic changes of the abdominal aorta but there are no focal hemodynamically significant lesions noted.  Single renal arteries are noted with normal nephrograms.  No evidence of renal artery stenosis.  The aortic bifurcation demonstrates increasing calcific disease but it is patent the right common iliac, internal iliac and external iliac demonstrate increasing calcific disease but there are no focal hemodynamically significant lesions.  The left common iliac demonstrates a 40% stenosis from its origin extending approximately 2 to 3 cm in length.  Distally there are no hemodynamically significant lesions in the iliac bifurcation, internal iliac or external iliac arteries ? ?The right common femoral profunda femoris are  diffusely calcified but no hemodynamically significant lesions are present.  The right superficial femoral artery demonstrates increasing calcific disease which progresses to an 80 to 85% stenosis at Hunter's canal extending over a distance of approximately 30 mm in length.  Distal to this lesion the popliteal artery is diffusely calcified but free of hemodynamically significant lesions.  The trifurcation is patent and there is 3 vessel runoff to the foot with the posterior tibial being the dominant vessel.  The tibial arteries are diffusely calcified but there are no flow-limiting stenoses noted.  The posterior tibial  fills the heel quite nicely and also fills the pedal arch.  The anterior tibial artery is patent all the way down filling the dorsalis pedis which also contributes to the pedal arch. ? ?Following angioplasty and sten

## 2021-11-10 ENCOUNTER — Encounter: Payer: Self-pay | Admitting: Vascular Surgery

## 2021-11-10 DIAGNOSIS — Z89512 Acquired absence of left leg below knee: Secondary | ICD-10-CM

## 2021-11-10 DIAGNOSIS — L089 Local infection of the skin and subcutaneous tissue, unspecified: Secondary | ICD-10-CM

## 2021-11-10 DIAGNOSIS — T8742 Infection of amputation stump, left upper extremity: Secondary | ICD-10-CM

## 2021-11-10 DIAGNOSIS — T148XXA Other injury of unspecified body region, initial encounter: Secondary | ICD-10-CM | POA: Diagnosis not present

## 2021-11-10 DIAGNOSIS — E1165 Type 2 diabetes mellitus with hyperglycemia: Secondary | ICD-10-CM

## 2021-11-10 DIAGNOSIS — I739 Peripheral vascular disease, unspecified: Secondary | ICD-10-CM

## 2021-11-10 DIAGNOSIS — Z794 Long term (current) use of insulin: Secondary | ICD-10-CM

## 2021-11-10 DIAGNOSIS — L03115 Cellulitis of right lower limb: Secondary | ICD-10-CM | POA: Diagnosis not present

## 2021-11-10 LAB — AEROBIC/ANAEROBIC CULTURE W GRAM STAIN (SURGICAL/DEEP WOUND)
Gram Stain: NONE SEEN
Gram Stain: NONE SEEN

## 2021-11-10 LAB — GLUCOSE, CAPILLARY
Glucose-Capillary: 200 mg/dL — ABNORMAL HIGH (ref 70–99)
Glucose-Capillary: 199 mg/dL — ABNORMAL HIGH (ref 70–99)
Glucose-Capillary: 162 mg/dL — ABNORMAL HIGH (ref 70–99)

## 2021-11-10 LAB — ANCA PROFILE
Anti-MPO Antibodies: <0.2 units (ref 0.0–0.9)
Anti-PR3 Antibodies: <0.2 units (ref 0.0–0.9)
Atypical P-ANCA titer: <1:20 {titer}
C-ANCA: <1:20 {titer}
P-ANCA: <1:20 {titer}

## 2021-11-10 LAB — CBC
HCT: 28.3 % — ABNORMAL LOW (ref 36.0–46.0)
Hemoglobin: 8.4 g/dL — ABNORMAL LOW (ref 12.0–15.0)
MCH: 24.6 pg — ABNORMAL LOW (ref 26.0–34.0)
MCHC: 29.7 g/dL — ABNORMAL LOW (ref 30.0–36.0)
MCV: 82.7 fL (ref 80.0–100.0)
Platelets: 326 10*3/uL (ref 150–400)
RBC: 3.42 MIL/uL — ABNORMAL LOW (ref 3.87–5.11)
RDW: 18.1 % — ABNORMAL HIGH (ref 11.5–15.5)
WBC: 8.3 10*3/uL (ref 4.0–10.5)
nRBC: 0 % (ref 0.0–0.2)

## 2021-11-10 LAB — ANA: Anti Nuclear Antibody (ANA): NEGATIVE

## 2021-11-10 LAB — TYPE AND SCREEN
ABO/RH(D): O POS
Antibody Screen: NEGATIVE
Unit division: 0

## 2021-11-10 LAB — BASIC METABOLIC PANEL
Anion gap: 5 (ref 5–15)
BUN: 33 mg/dL — ABNORMAL HIGH (ref 6–20)
CO2: 26 mmol/L (ref 22–32)
Calcium: 8.6 mg/dL — ABNORMAL LOW (ref 8.9–10.3)
Chloride: 101 mmol/L (ref 98–111)
Creatinine, Ser: 1.06 mg/dL — ABNORMAL HIGH (ref 0.44–1.00)
GFR, Estimated: >60 mL/min (ref >60)
Glucose, Bld: 189 mg/dL — ABNORMAL HIGH (ref 70–99)
Potassium: 5.5 mmol/L — ABNORMAL HIGH (ref 3.5–5.1)
Sodium: 132 mmol/L — ABNORMAL LOW (ref 135–145)

## 2021-11-10 LAB — BPAM RBC
Blood Product Expiration Date: 202306012359
ISSUE DATE / TIME: 202305022251
Unit Type and Rh: 5100

## 2021-11-10 LAB — C3 COMPLEMENT: C3 Complement: 147 mg/dL (ref 82–167)

## 2021-11-10 LAB — C4 COMPLEMENT: Complement C4, Body Fluid: 22 mg/dL (ref 12–38)

## 2021-11-10 LAB — RHEUMATOID FACTOR: Rheumatoid fact SerPl-aCnc: <10 IU/mL (ref <14.0)

## 2021-11-10 MED ORDER — SODIUM ZIRCONIUM CYCLOSILICATE 10 G PO PACK
10.0000 g | PACK | Freq: Every day | ORAL | Status: AC
  Administered 2021-11-10: 10 g via ORAL
  Filled 2021-11-10 (×2): qty 1

## 2021-11-10 MED ORDER — INSULIN ASPART 100 UNIT/ML IJ SOLN
4.0000 [IU] | Freq: Three times a day (TID) | INTRAMUSCULAR | Status: DC
  Administered 2021-11-10 – 2021-11-20 (×28): 4 [IU] via SUBCUTANEOUS
  Filled 2021-11-10 (×27): qty 1

## 2021-11-10 MED ORDER — INSULIN GLARGINE-YFGN 100 UNIT/ML ~~LOC~~ SOLN
28.0000 [IU] | Freq: Every day | SUBCUTANEOUS | Status: DC
  Administered 2021-11-10 – 2021-11-17 (×7): 28 [IU] via SUBCUTANEOUS
  Filled 2021-11-10 (×8): qty 0.28

## 2021-11-10 MED ORDER — SODIUM CHLORIDE 0.9% FLUSH
10.0000 mL | Freq: Two times a day (BID) | INTRAVENOUS | Status: DC
  Administered 2021-11-10 – 2021-11-17 (×12): 10 mL

## 2021-11-10 MED ORDER — SODIUM CHLORIDE 0.9% FLUSH
10.0000 mL | INTRAVENOUS | Status: DC | PRN
  Administered 2021-11-13: 10 mL

## 2021-11-10 NOTE — Progress Notes (Signed)
ID ?Patient is awake.  More interactive   ?Awake and responding appropriately to questions ?Patient Vitals for the past 24 hrs: ? BP Temp Temp src Pulse Resp SpO2  ?11/10/21 0807 110/63 98.3 ?F (36.8 ?C) Oral 69 17 97 %  ?11/10/21 0531 109/70 98.5 ?F (36.9 ?C) -- 71 18 95 %  ?11/10/21 0140 106/63 -- -- 62 -- --  ?11/10/21 0123 96/60 98.2 ?F (36.8 ?C) -- 61 -- 98 %  ?11/09/21 2327 106/60 98.1 ?F (36.7 ?C) -- 66 -- 97 %  ?11/09/21 2310 101/62 98.2 ?F (36.8 ?C) -- 65 18 98 %  ?11/09/21 2011 124/60 98.1 ?F (36.7 ?C) Oral 74 18 100 %  ?11/09/21 1520 (!) 115/56 97.9 ?F (36.6 ?C) Oral 73 18 97 %  ?11/09/21 1400 114/68 -- -- 71 11 96 %  ?11/09/21 1345 125/67 -- -- 73 12 94 %  ?11/09/21 1330 119/69 -- -- 73 14 --  ?11/09/21 1322 (!) 153/74 -- -- 80 14 --  ?11/09/21 1317 (!) 151/84 -- -- 78 13 95 %  ?11/09/21 1312 129/71 -- -- 76 12 95 %  ?11/09/21 1307 121/73 -- -- 75 12 95 %  ?11/09/21 1302 138/76 -- -- 75 13 94 %  ?11/09/21 1257 139/76 -- -- 75 13 95 %  ?11/09/21 1252 117/72 -- -- 73 13 95 %  ?11/09/21 1247 130/74 -- -- 72 11 99 %  ?11/09/21 1242 137/79 -- -- 73 11 99 %  ?11/09/21 1237 132/80 -- -- 75 13 98 %  ?11/09/21 1235 136/76 -- -- 72 18 99 %  ?11/09/21 1233 136/76 -- -- 74 12 97 %  ?11/09/21 1232 -- -- -- -- -- 97 %  ?11/09/21 1116 124/71 97.9 ?F (36.6 ?C) Oral 75 10 96 %  ?Chest bilateral air entry ?Heart sound S1-S2  ?abdomen soft ?Wound vac left stump ?Rt heel wound with eschar ?Petechial looking lesions on the left arm is fading ? ?11/09/21 ? ? ? ?11/08/21 ? ? ? ? ? ?Labs ? ?  Latest Ref Rng & Units 11/10/2021  ?  4:34 AM 11/09/2021  ?  3:16 AM 11/08/2021  ?  7:28 PM  ?CBC  ?WBC 4.0 - 10.5 K/uL 8.3   8.0   8.2    ?Hemoglobin 12.0 - 15.0 g/dL 8.4   6.9   7.3    ?Hematocrit 36.0 - 46.0 % 28.3   24.2   24.9    ?Platelets 150 - 400 K/uL 326   311   327    ?  ? ?  Latest Ref Rng & Units 11/10/2021  ?  4:34 AM 11/09/2021  ?  3:16 AM 11/08/2021  ?  7:28 PM  ?CMP  ?Glucose 70 - 99 mg/dL 189   266   281    ?BUN 6 - 20 mg/dL 33   35   32     ?Creatinine 0.44 - 1.00 mg/dL 1.06   1.27   1.20    ?Sodium 135 - 145 mmol/L 132   130   130    ?Potassium 3.5 - 5.1 mmol/L 5.5   5.5   5.2    ?Chloride 98 - 111 mmol/L 101   98   96    ?CO2 22 - 32 mmol/L '26   24   23    '$ ?Calcium 8.9 - 10.3 mg/dL 8.6   7.7   8.0    ?Total Protein 6.5 - 8.1 g/dL   8.2    ?Total Bilirubin  0.3 - 1.2 mg/dL   0.6    ?Alkaline Phos 38 - 126 U/L   148    ?AST 15 - 41 U/L   13    ?ALT 0 - 44 U/L   8    ?  ? ?Micro ?Wound culture from left washout-enterobacter ?Rt heel wound culture- kelb, enterobacter and staph  ? ? ? ?Tongue-tie posterior calcaneus fracture with severe displacement and extension of the fracture line towards the posterior subtalar joint. ? ? ?Impression/recommendation ?Left BKA stump infection status post debridement and placement of wound VAC ?Cultures growing Enterobacter ?Currently on daptomycin, and  ertapenem.   ? ? ?Right heel unstageable wound with eschar.  This is growing multiple organisms including Klebsiella Enterobacter and MRSA  ?On x-ray there is Tongue-type posterior calcaneus fracture with severe displacement and extension of the fracture line towards the posterior subtalar joint. Very likely will end up with BKA as debridement may not be possible with underlying bone involvement ?underwent angio  and had angioplasty of the superficial femoral artery on the right side with stent placement. ? ?Continue daptomycin and ertapenem ? ?Diabetes mellitus on insulin ? ?Severe deconditioning ? ?CAP status post right mastectomy on anastrozole ? ?Severe anemia has received PRBC ? ?Anxiety/depression on BuSpar, citalopram, desvenlafaxine and trazodone ? ?Discussed with patient and care team ? ? ? ?

## 2021-11-10 NOTE — Progress Notes (Signed)
[place] at [facility] ? ? ?PATIENT NAME: [name]   ? ?MR#:  [id number] ? ?DATE OF BIRTH:  11-Mar-1971 ? ?SUBJECTIVE:  ?husband at bedside ?denies any complaints. Wears oxygen at nighttime. ? ? ?VITALS:  ?Blood pressure 118/66, pulse 69, temperature 98.7 ?F (37.1 ?C), resp. rate 18, height '5\' 4"'$  (1.626 m), weight 105.5 kg, SpO2 98 %. ? ?PHYSICAL EXAMINATION:  ? ?GENERAL:  51 y.o.-year-old patient lying in the bed with no acute distress. Chronically ill ?LUNGS: Normal breath sounds bilaterally, no wheezing, rales, rhonchi.  ?CARDIOVASCULAR: S1, S2 normal. No murmurs, rubs, or gallops.  ?ABDOMEN: Soft, nontender, nondistended. Bowel sounds present.  ?EXTREMITIES:  ?Left stump ? ?Right Foot ?NEUROLOGIC: nonfocal  patient is alert and awake ?SKIN: above ? ?LABORATORY PANEL:  ?CBC ?Recent Labs  ?Lab 11/10/21 ?0434  ?WBC 8.3  ?HGB 8.4*  ?HCT 28.3*  ?PLT 326  ? ? ?Chemistries  ?Recent Labs  ?Lab 11/08/21 ?1928 11/09/21 ?6812 11/10/21 ?0434  ?NA 130*   < > 132*  ?K 5.2*   < > 5.5*  ?CL 96*   < > 101  ?CO2 23   < > 26  ?GLUCOSE 281*   < > 189*  ?BUN 32*   < > 33*  ?CREATININE 1.20*   < > 1.06*  ?CALCIUM 8.0*   < > 8.6*  ?AST 13*  --   --   ?ALT 8  --   --   ?ALKPHOS 148*  --   --   ?BILITOT 0.6  --   --   ? < > = values in this interval not displayed.  ? ?Cardiac Enzymes ?No results for input(s): TROPONINI in the last 168 hours. ?RADIOLOGY:  ?PERIPHERAL VASCULAR CATHETERIZATION ? ?Result Date: 11/09/2021 ?See surgical note for result. ? ?ECHOCARDIOGRAM COMPLETE ? ?Result Date: 11/09/2021 ?   ECHOCARDIOGRAM REPORT   Patient Name:   CHANA LINDSTROM Date of Exam: 11/09/2021 Medical Rec #:  [id number]     Height:       64.0 in Accession #:    [id number]    Weight:       232.6 lb Date of Birth:  Jan 19, 1971     BSA:          2.086 m? Patient Age:    72 years      BP:           114/68 mmHg Patient Gender: F             HR:           71 bpm. Exam Location:  [facility] Procedure: 2D Echo, Cardiac Doppler and Color  Doppler Indications:     Endocarditis I38  History:         Patient has prior history of Echocardiogram examinations, most                  recent 10/13/2021. CHF, COPD; Risk Factors:Diabetes. Aortic valve                  calcification.  Sonographer:     Sherrie Sport Referring Phys:  [id number] Emeterio Reeve Diagnosing Phys: Nelva Bush MD  Sonographer Comments: Suboptimal apical window. IMPRESSIONS  1. Left ventricular ejection fraction, by estimation, is 60 to 65%. The left ventricle has normal function. The left ventricle has no regional wall motion abnormalities. Left ventricular diastolic parameters were normal.  2. Right ventricular systolic function is normal. The right ventricular size is normal. There is  normal pulmonary artery systolic pressure.  3. The mitral valve is degenerative. Mild mitral valve regurgitation. No evidence of mitral stenosis.  4. The aortic valve is tricuspid. There is mild calcification of the aortic valve. There is mild thickening of the aortic valve. Aortic valve regurgitation is not visualized. Aortic valve sclerosis/calcification is present, without any evidence of aortic stenosis.  5. The inferior vena cava is normal in size with <50% respiratory variability, suggesting right atrial pressure of 8 mmHg. Conclusion(s)/Recommendation(s): There is no definite evidence of endocarditis, though native valvular disease is present. Consider transesophageal echocardiogram if clinical concern for endocarditis persists. FINDINGS  Left Ventricle: Left ventricular ejection fraction, by estimation, is 60 to 65%. The left ventricle has normal function. The left ventricle has no regional wall motion abnormalities. The left ventricular internal cavity size was normal in size. There is  no left ventricular hypertrophy. Left ventricular diastolic parameters were normal. Right Ventricle: The right ventricular size is normal. No increase in right ventricular wall thickness. Right ventricular  systolic function is normal. There is normal pulmonary artery systolic pressure. The tricuspid regurgitant velocity is 2.43 m/s, and  with an assumed right atrial pressure of 8 mmHg, the estimated right ventricular systolic pressure is 08.1 mmHg. Left Atrium: Left atrial size was normal in size. Right Atrium: Right atrial size was normal in size. Pericardium: There is no evidence of pericardial effusion. Mitral Valve: The mitral valve is degenerative in appearance. Mild mitral annular calcification. Mild mitral valve regurgitation. No evidence of mitral valve stenosis. MV peak gradient, 10.4 mmHg. The mean mitral valve gradient is 3.0 mmHg. Tricuspid Valve: The tricuspid valve is normal in structure. Tricuspid valve regurgitation is mild. Aortic Valve: The aortic valve is tricuspid. There is mild calcification of the aortic valve. There is mild thickening of the aortic valve. Aortic valve regurgitation is not visualized. Aortic valve sclerosis/calcification is present, without any evidence of aortic stenosis. Aortic valve mean gradient measures 6.0 mmHg. Aortic valve peak gradient measures 10.3 mmHg. Aortic valve area, by VTI measures 2.03 cm?. Pulmonic Valve: The pulmonic valve was not well visualized. Pulmonic valve regurgitation is not visualized. No evidence of pulmonic stenosis. Aorta: The aortic root is normal in size and structure. Pulmonary Artery: The pulmonary artery is not well seen. Venous: The inferior vena cava is normal in size with less than 50% respiratory variability, suggesting right atrial pressure of 8 mmHg. IAS/Shunts: The interatrial septum was not well visualized.  LEFT VENTRICLE PLAX 2D LVIDd:         4.10 cm   Diastology LVIDs:         2.20 cm   LV e' medial:    9.03 cm/s LV PW:         0.70 cm   LV E/e' medial:  14.2 LV IVS:        0.75 cm   LV e' lateral:   10.90 cm/s LVOT diam:     2.00 cm   LV E/e' lateral: 11.7 LV SV:         62 LV SV Index:   30 LVOT Area:     3.14 cm?  RIGHT  VENTRICLE RV Basal diam:  2.80 cm RV S prime:     9.90 cm/s TAPSE (M-mode): 1.9 cm LEFT ATRIUM             Index        RIGHT ATRIUM           Index LA diam:  4.30 cm 2.06 cm/m?   RA Area:     12.20 cm? LA Vol (A2C):   37.9 ml 18.17 ml/m?  RA Volume:   27.40 ml  13.14 ml/m? LA Vol (A4C):   48.5 ml 23.25 ml/m? LA Biplane Vol: 44.0 ml 21.09 ml/m?  AORTIC VALVE                     PULMONIC VALVE AV Area (Vmax):    1.82 cm?      PV Vmax:        0.73 m/s AV Area (Vmean):   1.73 cm?      PV Vmean:       46.000 cm/s AV Area (VTI):     2.03 cm?      PV VTI:         0.136 m AV Vmax:           160.33 cm/s   PV Peak grad:   2.1 mmHg AV Vmean:          115.667 cm/s  PV Mean grad:   1.0 mmHg AV VTI:            0.306 m       RVOT Peak grad: 3 mmHg AV Peak Grad:      10.3 mmHg AV Mean Grad:      6.0 mmHg LVOT Vmax:         92.70 cm/s LVOT Vmean:        63.700 cm/s LVOT VTI:          0.198 m LVOT/AV VTI ratio: 0.65  AORTA Ao Root diam: 2.60 cm MITRAL VALVE                TRICUSPID VALVE MV Area (PHT): 5.62 cm?     TR Peak grad:   23.6 mmHg MV Area VTI:   1.68 cm?     TR Vmax:        243.00 cm/s MV Peak grad:  10.4 mmHg MV Mean grad:  3.0 mmHg     SHUNTS MV Vmax:       1.61 m/s     Systemic VTI:  0.20 m MV Vmean:      73.1 cm/s    Systemic Diam: 2.00 cm MV Decel Time: 135 msec     Pulmonic VTI:  0.145 m MV E velocity: 128.00 cm/s MV A velocity: 38.10 cm/s MV E/A ratio:  3.36 Nelva Bush MD Electronically signed by Nelva Bush MD Signature Date/Time: 11/09/2021/4:58:49 PM    Final    ? ?Assessment and Plan ? ?Ms. Hawe is a 51 year old Caucasian female with PMH CHF, CAD, DM 2, tobacco use, presenting with concern for wound infection on surgical site, she is status post BKA on 10/11/2021, currently being followed at home by wound care nurse.  Patient presented to the ED 11/04/2021 reports purulent drainage, warmth, pain at surgical site, on left stump, as well as wound on right heel ? ?Cellulitis of lower  extremity ?--Continue current IV antibiotics, ID is consulted, appreciate their recommendations as well ?--11/05/21 underwent Excisional debridement left below-knee amputation stump including skin subcutaneous tissues muscle and bone a

## 2021-11-11 DIAGNOSIS — L03115 Cellulitis of right lower limb: Secondary | ICD-10-CM | POA: Diagnosis not present

## 2021-11-11 DIAGNOSIS — T148XXA Other injury of unspecified body region, initial encounter: Secondary | ICD-10-CM | POA: Diagnosis not present

## 2021-11-11 DIAGNOSIS — I739 Peripheral vascular disease, unspecified: Secondary | ICD-10-CM | POA: Diagnosis not present

## 2021-11-11 DIAGNOSIS — E1165 Type 2 diabetes mellitus with hyperglycemia: Secondary | ICD-10-CM | POA: Diagnosis not present

## 2021-11-11 LAB — CBC
HCT: 28.4 % — ABNORMAL LOW (ref 36.0–46.0)
Hemoglobin: 8.4 g/dL — ABNORMAL LOW (ref 12.0–15.0)
MCH: 24.3 pg — ABNORMAL LOW (ref 26.0–34.0)
MCHC: 29.6 g/dL — ABNORMAL LOW (ref 30.0–36.0)
MCV: 82.3 fL (ref 80.0–100.0)
Platelets: 304 10*3/uL (ref 150–400)
RBC: 3.45 MIL/uL — ABNORMAL LOW (ref 3.87–5.11)
RDW: 18.1 % — ABNORMAL HIGH (ref 11.5–15.5)
WBC: 7.9 10*3/uL (ref 4.0–10.5)
nRBC: 0 % (ref 0.0–0.2)

## 2021-11-11 LAB — BASIC METABOLIC PANEL
Anion gap: 4 — ABNORMAL LOW (ref 5–15)
BUN: 32 mg/dL — ABNORMAL HIGH (ref 6–20)
CO2: 27 mmol/L (ref 22–32)
Calcium: 8.5 mg/dL — ABNORMAL LOW (ref 8.9–10.3)
Chloride: 103 mmol/L (ref 98–111)
Creatinine, Ser: 0.98 mg/dL (ref 0.44–1.00)
GFR, Estimated: >60 mL/min (ref >60)
Glucose, Bld: 143 mg/dL — ABNORMAL HIGH (ref 70–99)
Potassium: 5.3 mmol/L — ABNORMAL HIGH (ref 3.5–5.1)
Sodium: 134 mmol/L — ABNORMAL LOW (ref 135–145)

## 2021-11-11 LAB — GLUCOSE, CAPILLARY
Glucose-Capillary: 141 mg/dL — ABNORMAL HIGH (ref 70–99)
Glucose-Capillary: 147 mg/dL — ABNORMAL HIGH (ref 70–99)
Glucose-Capillary: 165 mg/dL — ABNORMAL HIGH (ref 70–99)
Glucose-Capillary: 168 mg/dL — ABNORMAL HIGH (ref 70–99)
Glucose-Capillary: 182 mg/dL — ABNORMAL HIGH (ref 70–99)
Glucose-Capillary: 197 mg/dL — ABNORMAL HIGH (ref 70–99)

## 2021-11-11 MED ORDER — MORPHINE SULFATE (PF) 2 MG/ML IV SOLN
2.0000 mg | INTRAVENOUS | Status: DC | PRN
  Administered 2021-11-11: 2 mg via INTRAVENOUS
  Filled 2021-11-11 (×2): qty 1

## 2021-11-11 NOTE — Progress Notes (Signed)
[place] at [facility] ? ? ?PATIENT NAME: [name]   ? ?MR#:  [id number] ? ?DATE OF BIRTH:  1970-09-06 ? ?SUBJECTIVE:  ?husband at bedside ?denies any complaints. Wears oxygen at nighttime. ? ? ?VITALS:  ?Blood pressure 102/62, pulse 65, temperature 98.4 ?F (36.9 ?C), resp. rate 16, height '5\' 4"'$  (1.626 m), weight 108.5 kg, SpO2 100 %. ? ?PHYSICAL EXAMINATION:  ? ?GENERAL:  51 y.o.-year-old patient lying in the bed with no acute distress. Chronically ill ?LUNGS: Normal breath sounds bilaterally, no wheezing, rales, rhonchi.  ?CARDIOVASCULAR: S1, S2 normal. No murmurs, rubs, or gallops.  ?ABDOMEN: Soft, nontender, nondistended. Bowel sounds present.  ?EXTREMITIES:  ?Left stump ? ?Right Foot ?NEUROLOGIC: nonfocal  patient is alert and awake ?SKIN: above ? ?LABORATORY PANEL:  ?CBC ?Recent Labs  ?Lab 11/11/21 ?0160  ?WBC 7.9  ?HGB 8.4*  ?HCT 28.4*  ?PLT 304  ? ? ? ?Chemistries  ?Recent Labs  ?Lab 11/08/21 ?1928 11/09/21 ?1093 11/11/21 ?2355  ?NA 130*   < > 134*  ?K 5.2*   < > 5.3*  ?CL 96*   < > 103  ?CO2 23   < > 27  ?GLUCOSE 281*   < > 143*  ?BUN 32*   < > 32*  ?CREATININE 1.20*   < > 0.98  ?CALCIUM 8.0*   < > 8.5*  ?AST 13*  --   --   ?ALT 8  --   --   ?ALKPHOS 148*  --   --   ?BILITOT 0.6  --   --   ? < > = values in this interval not displayed.  ? ? ?Cardiac Enzymes ?No results for input(s): TROPONINI in the last 168 hours. ?RADIOLOGY:  ?No results found. ? ?Assessment and Plan ? ?Ms. Roger is a 51 year old Caucasian female with PMH CHF, CAD, DM 2, tobacco use, presenting with concern for wound infection on surgical site, she is status post BKA on 10/11/2021, currently being followed at home by wound care nurse.  Patient presented to the ED 11/04/2021 reports purulent drainage, warmth, pain at surgical site, on left stump, as well as wound on right heel ? ?Cellulitis of lower extremity ?--Continue current IV antibiotics, ID is consulted, appreciate their recommendations as  well ?--11/05/21 underwent Excisional debridement left below-knee amputation stump including skin subcutaneous tissues muscle and bone and application of a wound VAC w/ vascular surgery  ? ?right heel gangrene ? -- status post right lower extremity angiogram with stent placement in superficial femoral vein ?-- discussed with vascular surgery Dr. Delana Meyer will discussed with patient regarding right below knee amputation ?--5/3-- curb sided podiatry Dr. Sherryle Lis who took a look at the pictures and x-rays and recommended above ?--5/4-- awaiting Dr. Nino Parsley discussion with family regarding right foot amputation ? ?Uncontrolled type 2 diabetes mellitus with hyperglycemia, with long-term current use of insulin ([place]) ?--SSI/ glycemic protocol. ?-- cont semglee ?  ?Chronic diastolic CHF (congestive heart failure) ([place]) ?--Stable. ?--Last echo earlier this month done by dr Nehemiah Massed. ?--Cont her on aspirin 81, rosuvastatin, Aldactone. ?   ?Tobacco abuse ?--Nicotine patch. ?  ?Chronic kidney disease, stage 3a ([place]) ?--Creatinine today about baseline ?  ?Epilepsy, unspecified, not intractable, without status epilepticus ([place]) ?--Pt is not on AED's. ? ?Anemia ?--Likely due to surgical blood loss ?--Patient is status posttransfusion 2 units PRBC on this hospitalization ? ?PVD (peripheral vascular disease) ([place]) ?--Diabetic ulcer right heel, vascular surgery planning for angiography RLE 11/09/2021 ?  ?Rash  ?--  See photos  ?-- appears to be fading. Etiology unclear. ?-- Non-itchy rash ? ? ?Procedures: as above ?Family communication : husband at bedside ?Consults : ID, vascular ?CODE STATUS: full ?DVT Prophylaxis : heparin ?Level of care: Med-Surg ?Status is: Inpatient ?Remains inpatient appropriate because: severe peripheral vascular disease with amputation stump infection on the left and right heel gangrene that will require BKA ?  ? ?TOTAL TIME TAKING CARE OF THIS PATIENT: 30 minutes.  ?>50% time spent on counselling and  coordination of care ? ?Note: This dictation was prepared with Dragon dictation along with smaller phrase technology. Any transcriptional errors that result from this process are unintentional. ? ?[name] M.D  ? ? ?[facility]  ? ?CC: ?Primary care physician; Pleas Koch, NP  ?

## 2021-11-11 NOTE — Plan of Care (Signed)
?  Problem: Education: ?Goal: Knowledge of General Education information will improve ?Description: Including pain rating scale, medication(s)/side effects and non-pharmacologic comfort measures ?Outcome: Progressing ?  ?Problem: Health Behavior/Discharge Planning: ?Goal: Ability to manage health-related needs will improve ?Outcome: Progressing ?  ?Problem: Clinical Measurements: ?Goal: Ability to maintain clinical measurements within normal limits will improve ?Outcome: Progressing ?  ?Problem: Clinical Measurements: ?Goal: Will remain free from infection ?Outcome: Progressing ?  ?Problem: Clinical Measurements: ?Goal: Diagnostic test results will improve ?Outcome: Progressing ?  ?Problem: Clinical Measurements: ?Goal: Respiratory complications will improve ?Outcome: Progressing ?  ?Problem: Nutrition: ?Goal: Adequate nutrition will be maintained ?Outcome: Progressing ?  ?Problem: Pain Managment: ?Goal: General experience of comfort will improve ?Outcome: Progressing ?  ?Problem: Safety: ?Goal: Ability to remain free from injury will improve ?Outcome: Progressing ?  ?Problem: Skin Integrity: ?Goal: Risk for impaired skin integrity will decrease ?Outcome: Progressing ?  ?Problem: Fluid Volume: ?Goal: Ability to maintain a balanced intake and output will improve ?Outcome: Progressing ?  ?Problem: Health Behavior/Discharge Planning: ?Goal: Ability to manage health-related needs will improve ?Outcome: Progressing ?  ?Problem: Nutritional: ?Goal: Progress toward achieving an optimal weight will improve ?Outcome: Progressing ?  ?Problem: Education: ?Goal: Individualized Educational Video(s) ?Outcome: Progressing ?  ?Problem: Metabolic: ?Goal: Ability to maintain appropriate glucose levels will improve ?Outcome: Progressing ?  ?Problem: Skin Integrity: ?Goal: Skin integrity will improve ?Outcome: Progressing ?  ?

## 2021-11-11 NOTE — Progress Notes (Incomplete)
Foley catheter discontinued, deflated 10 ?

## 2021-11-12 DIAGNOSIS — L03115 Cellulitis of right lower limb: Secondary | ICD-10-CM | POA: Diagnosis not present

## 2021-11-12 DIAGNOSIS — E1165 Type 2 diabetes mellitus with hyperglycemia: Secondary | ICD-10-CM | POA: Diagnosis not present

## 2021-11-12 DIAGNOSIS — T148XXA Other injury of unspecified body region, initial encounter: Secondary | ICD-10-CM | POA: Diagnosis not present

## 2021-11-12 DIAGNOSIS — L089 Local infection of the skin and subcutaneous tissue, unspecified: Secondary | ICD-10-CM | POA: Diagnosis not present

## 2021-11-12 DIAGNOSIS — I739 Peripheral vascular disease, unspecified: Secondary | ICD-10-CM | POA: Diagnosis not present

## 2021-11-12 DIAGNOSIS — T8740 Infection of amputation stump, unspecified extremity: Secondary | ICD-10-CM | POA: Diagnosis not present

## 2021-11-12 DIAGNOSIS — D649 Anemia, unspecified: Secondary | ICD-10-CM | POA: Diagnosis not present

## 2021-11-12 LAB — GLUCOSE, CAPILLARY
Glucose-Capillary: 166 mg/dL — ABNORMAL HIGH (ref 70–99)
Glucose-Capillary: 162 mg/dL — ABNORMAL HIGH (ref 70–99)
Glucose-Capillary: 154 mg/dL — ABNORMAL HIGH (ref 70–99)
Glucose-Capillary: 163 mg/dL — ABNORMAL HIGH (ref 70–99)

## 2021-11-12 LAB — TYPE AND SCREEN
ABO/RH(D): O POS
Antibody Screen: NEGATIVE

## 2021-11-12 MED ORDER — SODIUM ZIRCONIUM CYCLOSILICATE 10 G PO PACK
10.0000 g | PACK | Freq: Once | ORAL | Status: AC
  Administered 2021-11-12: 10 g via ORAL
  Filled 2021-11-12: qty 1
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
## 2021-11-12 NOTE — Plan of Care (Signed)
?  Problem: Education: ?Goal: Knowledge of General Education information will improve ?Description: Including pain rating scale, medication(s)/side effects and non-pharmacologic comfort measures ?Outcome: Progressing ?  ?Problem: Health Behavior/Discharge Planning: ?Goal: Ability to manage health-related needs will improve ?Outcome: Not Progressing ?  ?Problem: Clinical Measurements: ?Goal: Ability to maintain clinical measurements within normal limits will improve ?Outcome: Progressing ?Goal: Will remain free from infection ?Outcome: Progressing ?Goal: Diagnostic test results will improve ?Outcome: Progressing ?Goal: Respiratory complications will improve ?Outcome: Progressing ?Goal: Cardiovascular complication will be avoided ?Outcome: Progressing ?  ?Problem: Activity: ?Goal: Risk for activity intolerance will decrease ?Outcome: Not Progressing ?  ?Problem: Nutrition: ?Goal: Adequate nutrition will be maintained ?Outcome: Not Progressing ?  ?Problem: Coping: ?Goal: Level of anxiety will decrease ?Outcome: Progressing ?  ?Problem: Elimination: ?Goal: Will not experience complications related to bowel motility ?Outcome: Progressing ?Goal: Will not experience complications related to urinary retention ?Outcome: Progressing ?  ?Problem: Pain Managment: ?Goal: General experience of comfort will improve ?Outcome: Progressing ?  ?Problem: Safety: ?Goal: Ability to remain free from injury will improve ?Outcome: Progressing ?  ?Problem: Skin Integrity: ?Goal: Risk for impaired skin integrity will decrease ?Outcome: Progressing ?  ?Problem: Education: ?Goal: Ability to describe self-care measures that may prevent or decrease complications (Diabetes Survival Skills Education) will improve ?Outcome: Not Progressing ?Goal: Individualized Educational Video(s) ?Outcome: Not Progressing ?  ?Problem: Coping: ?Goal: Ability to adjust to condition or change in health will improve ?Outcome: Progressing ?  ?Problem: Fluid  Volume: ?Goal: Ability to maintain a balanced intake and output will improve ?Outcome: Progressing ?  ?Problem: Health Behavior/Discharge Planning: ?Goal: Ability to identify and utilize available resources and services will improve ?Outcome: Not Progressing ?Goal: Ability to manage health-related needs will improve ?Outcome: Not Progressing ?  ?Problem: Metabolic: ?Goal: Ability to maintain appropriate glucose levels will improve ?Outcome: Not Progressing ?  ?Problem: Nutritional: ?Goal: Maintenance of adequate nutrition will improve ?Outcome: Not Progressing ?Goal: Progress toward achieving an optimal weight will improve ?Outcome: Not Progressing ?  ?Problem: Skin Integrity: ?Goal: Risk for impaired skin integrity will decrease ?Outcome: Progressing ?  ?Problem: Tissue Perfusion: ?Goal: Adequacy of tissue perfusion will improve ?Outcome: Progressing ?  ?Problem: Education: ?Goal: Ability to describe self-care measures that may prevent or decrease complications (Diabetes Survival Skills Education) will improve ?Outcome: Not Progressing ?Goal: Individualized Educational Video(s) ?Outcome: Not Progressing ?  ?Problem: Coping: ?Goal: Ability to adjust to condition or change in health will improve ?Outcome: Progressing ?  ?Problem: Fluid Volume: ?Goal: Ability to maintain a balanced intake and output will improve ?Outcome: Not Progressing ?  ?Problem: Health Behavior/Discharge Planning: ?Goal: Ability to identify and utilize available resources and services will improve ?Outcome: Not Progressing ?Goal: Ability to manage health-related needs will improve ?Outcome: Not Progressing ?  ?Problem: Metabolic: ?Goal: Ability to maintain appropriate glucose levels will improve ?Outcome: Not Progressing ?  ?Problem: Nutritional: ?Goal: Maintenance of adequate nutrition will improve ?Outcome: Not Progressing ?Goal: Progress toward achieving an optimal weight will improve ?Outcome: Not Progressing ?  ?Problem: Skin  Integrity: ?Goal: Risk for impaired skin integrity will decrease ?Outcome: Progressing ?  ?Problem: Tissue Perfusion: ?Goal: Adequacy of tissue perfusion will improve ?Outcome: Progressing ?  ?Problem: Clinical Measurements: ?Goal: Ability to avoid or minimize complications of infection will improve ?Outcome: Not Progressing ?  ?Problem: Skin Integrity: ?Goal: Skin integrity will improve ?Outcome: Progressing ?  ?

## 2021-11-12 NOTE — Plan of Care (Signed)
?  Problem: Education: ?Goal: Knowledge of General Education information will improve ?Description: Including pain rating scale, medication(s)/side effects and non-pharmacologic comfort measures ?Outcome: Progressing ?  ?Problem: Health Behavior/Discharge Planning: ?Goal: Ability to manage health-related needs will improve ?Outcome: Progressing ?  ?Problem: Clinical Measurements: ?Goal: Will remain free from infection ?Outcome: Progressing ?  ?Problem: Clinical Measurements: ?Goal: Respiratory complications will improve ?Outcome: Progressing ?  ?Problem: Clinical Measurements: ?Goal: Diagnostic test results will improve ?Outcome: Progressing ?  ?Problem: Clinical Measurements: ?Goal: Cardiovascular complication will be avoided ?Outcome: Progressing ?  ?Problem: Nutrition: ?Goal: Adequate nutrition will be maintained ?Outcome: Progressing ?  ?Problem: Elimination: ?Goal: Will not experience complications related to bowel motility ?Outcome: Progressing ?  ?Problem: Pain Managment: ?Goal: General experience of comfort will improve ?Outcome: Progressing ?  ?Problem: Education: ?Goal: Individualized Educational Video(s) ?Outcome: Progressing ?  ?Problem: Skin Integrity: ?Goal: Risk for impaired skin integrity will decrease ?Outcome: Progressing ?  ?Problem: Tissue Perfusion: ?Goal: Adequacy of tissue perfusion will improve ?Outcome: Progressing ?  ?Problem: Education: ?Goal: Individualized Educational Video(s) ?Outcome: Progressing ?  ?

## 2021-11-12 NOTE — Progress Notes (Addendum)
[place] at [facility] ? ? ?PATIENT NAME: [name]   ? ?MR#:  [id number] ? ?DATE OF BIRTH:  December 10, 1970 ? ?SUBJECTIVE:  ?husband at bedside ?denies any complaints. Wears oxygen at nighttime. ?Plans for right BKA on monday ? ? ?VITALS:  ?Blood pressure (!) 93/55, pulse 61, temperature 98.7 ?F (37.1 ?C), resp. rate 16, height '5\' 4"'$  (1.626 m), weight 109.3 kg, SpO2 97 %. ? ?PHYSICAL EXAMINATION:  ? ?GENERAL:  51 y.o.-year-old patient lying in the bed with no acute distress. Chronically ill ?LUNGS: Normal breath sounds bilaterally ?CARDIOVASCULAR: S1, S2 normal.  ?ABDOMEN: Soft, nontender, nondistended. Bowel sounds present.  ?EXTREMITIES:  ?Left stump ? ?Right Foot ?NEUROLOGIC: nonfocal  patient is alert and awake ?SKIN: above ? ?LABORATORY PANEL:  ?CBC ?Recent Labs  ?Lab 11/11/21 ?0539  ?WBC 7.9  ?HGB 8.4*  ?HCT 28.4*  ?PLT 304  ? ? ? ?Chemistries  ?Recent Labs  ?Lab 11/08/21 ?1928 11/09/21 ?7673 11/11/21 ?4193  ?NA 130*   < > 134*  ?K 5.2*   < > 5.3*  ?CL 96*   < > 103  ?CO2 23   < > 27  ?GLUCOSE 281*   < > 143*  ?BUN 32*   < > 32*  ?CREATININE 1.20*   < > 0.98  ?CALCIUM 8.0*   < > 8.5*  ?AST 13*  --   --   ?ALT 8  --   --   ?ALKPHOS 148*  --   --   ?BILITOT 0.6  --   --   ? < > = values in this interval not displayed.  ? ? ?Cardiac Enzymes ?No results for input(s): TROPONINI in the last 168 hours. ?RADIOLOGY:  ?No results found. ? ?Assessment and Plan ? ?Ms. [name] is a 51 year old Caucasian female with PMH CHF, CAD, DM 2, tobacco use, presenting with concern for wound infection on surgical site, she is status post BKA on 10/11/2021, currently being followed at home by wound care nurse.  Patient presented to the ED 11/04/2021 reports purulent drainage, warmth, pain at surgical site, on left stump, as well as wound on right heel ? ?Cellulitis of lower extremity ?Eschar Left BKA stump ?--Continue current IV antibiotics, ID is consulted, appreciate their recommendations as well ?--11/05/21  underwent Excisional debridement left below-knee amputation stump including skin subcutaneous tissues muscle and bone and application of a wound VAC w/ vascular surgery  ? ?right heel gangrene/Diabetic foot ?PVD (peripheral vascular disease) ([place]) ? -- status post right lower extremity angiogram with stent placement in superficial femoral vein ?-- discussed with vascular surgery Dr. Delana Meyer will discussed with patient regarding right below knee amputation ?--5/3-- curb sided podiatry Dr. Sherryle Lis who took a look at the pictures and x-rays and recommended above ?--5/4-- awaiting Dr. Nino Parsley discussion with family regarding right foot amputation ?--5/5 -- per Dr. Delana Meyer were not available till next Monday ? ?Uncontrolled type 2 diabetes mellitus with hyperglycemia, with long-term current use of insulin ([place]) ?--SSI/ glycemic protocol. ?-- cont semglee ?  ?Chronic diastolic CHF (congestive heart failure) ([place]) ?--Stable. ?--Last echo earlier this month done by dr Nehemiah Massed. ?--Cont her on aspirin 81, rosuvastatin, Aldactone. ?   ?Tobacco abuse ?--Nicotine patch. ?  ?Chronic kidney disease, stage 3a ([place]) ?--Creatinine today about baseline ?  ?H/o Epilepsy--Pt is not on AED's. ? ?Anemia--acute blood loss ?--Likely due to surgical blood loss ?--Patient is status posttransfusion 2 units PRBC on this hospitalization ?--hgb 8.2 ?  ?Rash  ?--See photos  ?--  appears to be fading. Etiology unclear. ?-- Non-itchy rash ? ?Body mass index is 41.36 kg/m?. ?Obesity--complicates prognosis and overall outcome ? ? ?Procedures: as above ?Family communication : husband at bedside ?Consults : ID, vascular ?CODE STATUS: full ?DVT Prophylaxis : heparin ?Level of care: Med-Surg ?Status is: Inpatient ?Remains inpatient appropriate because: severe peripheral vascular disease with amputation stump infection on the left and right heel gangrene that will require BKA next week ?  ? ?TOTAL TIME TAKING CARE OF THIS PATIENT: 25 minutes.  ?>50%  time spent on counselling and coordination of care ? ?Note: This dictation was prepared with Dragon dictation along with smaller phrase technology. Any transcriptional errors that result from this process are unintentional. ? ?[name] M.D  ? ? ?[facility]  ? ?CC: ?Primary care physician; Pleas Koch, NP  ?

## 2021-11-12 NOTE — Progress Notes (Signed)
ID ?Patient is awake.  Says she wants to go fishing ?Feeling happy ?Awaiting right BKA ?She wants to get it done today but is not scheduled till Monday. ? ?Patient Vitals for the past 24 hrs: ? BP Temp Temp src Pulse Resp SpO2 Weight  ?11/12/21 0820 (!) 93/55 98.7 ?F (37.1 ?C) -- 61 16 97 % --  ?11/12/21 0500 -- -- -- -- -- -- 109.3 kg  ?11/12/21 0333 (!) 91/55 98.8 ?F (37.1 ?C) Oral 61 14 100 % --  ?11/11/21 2351 (!) 95/55 98.4 ?F (36.9 ?C) Oral 63 16 99 % --  ?11/11/21 1951 (!) 107/53 97.7 ?F (36.5 ?C) Oral 68 16 99 % --  ?11/11/21 1553 125/62 97.7 ?F (36.5 ?C) -- 71 14 100 % --  ?Chest bilateral air entry ?Heart sound S1-S2  ?abdomen soft ?Wound vac left stump ?Rt heel wound with eschar ?Petechial looking lesions on the left arm is fading ? ?11/09/21 ? ? ? ?11/08/21 ? ? ? ? ? ?Labs ? ?  Latest Ref Rng & Units 11/11/2021  ?  7:43 AM 11/10/2021  ?  4:34 AM 11/09/2021  ?  3:16 AM  ?CBC  ?WBC 4.0 - 10.5 K/uL 7.9   8.3   8.0    ?Hemoglobin 12.0 - 15.0 g/dL 8.4   8.4   6.9    ?Hematocrit 36.0 - 46.0 % 28.4   28.3   24.2    ?Platelets 150 - 400 K/uL 304   326   311    ?  ? ?  Latest Ref Rng & Units 11/11/2021  ?  7:43 AM 11/10/2021  ?  4:34 AM 11/09/2021  ?  3:16 AM  ?CMP  ?Glucose 70 - 99 mg/dL 143   189   266    ?BUN 6 - 20 mg/dL 32   33   35    ?Creatinine 0.44 - 1.00 mg/dL 0.98   1.06   1.27    ?Sodium 135 - 145 mmol/L 134   132   130    ?Potassium 3.5 - 5.1 mmol/L 5.3   5.5   5.5    ?Chloride 98 - 111 mmol/L 103   101   98    ?CO2 22 - 32 mmol/L '27   26   24    '$ ?Calcium 8.9 - 10.3 mg/dL 8.5   8.6   7.7    ?  ? ?Micro ?Wound culture from left washout-enterobacter ?Rt heel wound culture- kelb, enterobacter and staph  ? ? ? ?Tongue-tie posterior calcaneus fracture with severe displacement and extension of the fracture line towards the posterior subtalar joint. ? ? ?Impression/recommendation ?Left BKA stump infection status post debridement and placement of wound VAC ?Cultures growing Enterobacter ?Currently on daptomycin, and   ertapenem.   ? ? ?Right heel unstageable wound with eschar.  This is growing multiple organisms including Klebsiella Enterobacter and MRSA  ?On x-ray there is Tongue-type posterior calcaneus fracture with severe displacement and extension of the fracture line towards the posterior subtalar joint. underwent angio  and had angioplasty of the superficial femoral artery on the right side with stent placement. ?Is going for BKA on Monday. ?Continue daptomycin and ertapenem ? ?Diabetes mellitus on insulin ? ?Severe deconditioning ? ?CAP status post right mastectomy on anastrozole ? ?Severe anemia has received PRBC ? ?Anxiety/depression on BuSpar, citalopram, desvenlafaxine and trazodone ? ?Discussed with patient and care team ? ?ID will follow her peripherally this weekend.  Call if needed. ? ?

## 2021-11-13 DIAGNOSIS — L03115 Cellulitis of right lower limb: Secondary | ICD-10-CM | POA: Diagnosis not present

## 2021-11-13 LAB — BASIC METABOLIC PANEL
Anion gap: 6 (ref 5–15)
BUN: 51 mg/dL — ABNORMAL HIGH (ref 6–20)
CO2: 26 mmol/L (ref 22–32)
Calcium: 8.5 mg/dL — ABNORMAL LOW (ref 8.9–10.3)
Chloride: 102 mmol/L (ref 98–111)
Creatinine, Ser: 1.34 mg/dL — ABNORMAL HIGH (ref 0.44–1.00)
GFR, Estimated: 48 mL/min — ABNORMAL LOW (ref >60)
Glucose, Bld: 161 mg/dL — ABNORMAL HIGH (ref 70–99)
Potassium: 4.8 mmol/L (ref 3.5–5.1)
Sodium: 134 mmol/L — ABNORMAL LOW (ref 135–145)

## 2021-11-13 LAB — GLUCOSE, CAPILLARY
Glucose-Capillary: 148 mg/dL — ABNORMAL HIGH (ref 70–99)
Glucose-Capillary: 159 mg/dL — ABNORMAL HIGH (ref 70–99)
Glucose-Capillary: 156 mg/dL — ABNORMAL HIGH (ref 70–99)
Glucose-Capillary: 185 mg/dL — ABNORMAL HIGH (ref 70–99)
Glucose-Capillary: 161 mg/dL — ABNORMAL HIGH (ref 70–99)
Glucose-Capillary: 133 mg/dL — ABNORMAL HIGH (ref 70–99)

## 2021-11-13 NOTE — Progress Notes (Signed)
at [facility] ? ? ?PATIENT NAME: [name]   ? ?MR#:  [id number] ? ?DATE OF BIRTH:  1970-12-07 ? ?SUBJECTIVE:  ?husband at bedside ?denies any complaints. Wears oxygen at nighttime. ?Plans for right BKA on monday ? ? ?VITALS:  ?Blood pressure 140/76, pulse 69, temperature (!) 97.5 ?F (36.4 ?C), temperature source Oral, resp. rate 18, height '5\' 4"'$  (1.626 m), weight 109.3 kg, SpO2 96 %. ? ?PHYSICAL EXAMINATION:  ? ?GENERAL:  51 y.o.-year-old patient lying in the bed with no acute distress. Chronically ill ?LUNGS: Normal breath sounds bilaterally ?CARDIOVASCULAR: S1, S2 normal.  ?ABDOMEN: Soft, nontender, nondistended. Bowel sounds present.  ?EXTREMITIES:  ?Left stump ? ?Right Foot ?NEUROLOGIC: nonfocal  patient is alert and awake ?SKIN: above ? ?LABORATORY PANEL:  ?CBC ?Recent Labs  ?Lab 11/11/21 ?1324  ?WBC 7.9  ?HGB 8.4*  ?HCT 28.4*  ?PLT 304  ? ? ? ?Chemistries  ?Recent Labs  ?Lab 11/08/21 ?1928 11/09/21 ?4010 11/13/21 ?2725  ?NA 130*   < > 134*  ?K 5.2*   < > 4.8  ?CL 96*   < > 102  ?CO2 23   < > 26  ?GLUCOSE 281*   < > 161*  ?BUN 32*   < > 51*  ?CREATININE 1.20*   < > 1.34*  ?CALCIUM 8.0*   < > 8.5*  ?AST 13*  --   --   ?ALT 8  --   --   ?ALKPHOS 148*  --   --   ?BILITOT 0.6  --   --   ? < > = values in this interval not displayed.  ? ? ?Cardiac Enzymes ?No results for input(s): TROPONINI in the last 168 hours. ?RADIOLOGY:  ?No results found. ? ?Assessment and Plan ? ?Ms. Hanke is a 51 year old Caucasian female with PMH CHF, CAD, DM 2, tobacco use, presenting with concern for wound infection on surgical site, she is status post BKA on 10/11/2021, currently being followed at home by wound care nurse.  Patient presented to the ED 11/04/2021 reports purulent drainage, warmth, pain at surgical site, on left stump, as well as wound on right heel ? ?Cellulitis of lower extremity ?Eschar Left BKA stump ?--Continue current IV antibiotics, ID is consulted, appreciate their  recommendations as well ?--11/05/21 underwent Excisional debridement left below-knee amputation stump including skin subcutaneous tissues muscle and bone and application of a wound VAC w/ vascular surgery  ? ?right heel gangrene/Diabetic foot ?PVD (peripheral vascular disease) ([place]) ? -- status post right lower extremity angiogram with stent placement in superficial femoral vein ?-- discussed with vascular surgery Dr. Delana Meyer will discussed with patient regarding right below knee amputation ?--5/3-- curb sided podiatry Dr. Sherryle Lis who took a look at the pictures and x-rays and recommended above ?--5/4-- awaiting Dr. Nino Parsley discussion with family regarding right foot amputation ?--5/5 -- per Dr. Delana Meyer were not available till next Monday ? ?Uncontrolled type 2 diabetes mellitus with hyperglycemia, with long-term current use of insulin ([place]) ?--SSI/ glycemic protocol. ?-- cont semglee ?  ?Chronic diastolic CHF (congestive heart failure) ([place]) ?--Stable. ?--Last echo earlier this month done by dr Nehemiah Massed. ?--Cont her on aspirin 81, rosuvastatin, Aldactone. ?   ?Tobacco abuse ?--Nicotine patch. ?  ?Chronic kidney disease, stage 3a ([place]) ?--Creatinine today about baseline ?  ?H/o Epilepsy--Pt is not on AED's. ? ?Anemia--acute blood loss ?--Likely due to surgical blood loss ?--Patient is status posttransfusion 2 units PRBC on this hospitalization ?--hgb 8.2 ?  ?Rash  ?--  See photos  ?-- appears to be fading. Etiology unclear. ?-- Non-itchy rash ? ?Body mass index is 41.36 kg/m?. ?Obesity--complicates prognosis and overall outcome ? ? ?Procedures: as above ?Family communication : husband at bedside ?Consults : ID, vascular ?CODE STATUS: full ?DVT Prophylaxis : heparin ?Level of care: Med-Surg ?Status is: Inpatient ?Remains inpatient appropriate because: severe peripheral vascular disease with amputation stump infection on the left and right heel gangrene that will require BKA next week ?  ? ?TOTAL TIME TAKING CARE OF  THIS PATIENT: 25 minutes.  ?>50% time spent on counselling and coordination of care ? ?Note: This dictation was prepared with Dragon dictation along with smaller phrase technology. Any transcriptional errors that result from this process are unintentional. ? ?[name] M.D  ? ? ?[facility]  ? ?CC: ?Primary care physician; Pleas Koch, NP  ?

## 2021-11-13 NOTE — Progress Notes (Signed)
Pt receiving threatening phone calls. Pt can only have 2 visitors. Her husband [name]) and mother in-law [name]) ?

## 2021-11-13 NOTE — Progress Notes (Signed)
? ? ?Subjective  -  ? ?No complaints ?[place] for right bka ? ? ?Physical Exam: ? ?Wound vac in place to left BKA ? ? ? ? ? ? ?Assessment/Plan:   ? ?Patient scheduled for right BKA Monday am.  Will make NPO after midnight sunday ? ?Annamarie Major ?11/13/2021 ?5:57 PM ?-- ? ?Vitals:  ? 11/13/21 0802 11/13/21 1231  ?BP: 111/60 140/76  ?Pulse: 61 69  ?Resp: 18 18  ?Temp: 97.6 ?F (36.4 ?C) (!) 97.5 ?F (36.4 ?C)  ?SpO2: 97% 96%  ? ? ?Intake/Output Summary (Last 24 hours) at 11/13/2021 1757 ?Last data filed at 11/13/2021 0908 ?Gross per 24 hour  ?Intake 3 ml  ?Output 400 ml  ?Net -397 ml  ? ? ? ?Laboratory ?CBC ?   ?Component Value Date/Time  ? WBC 7.9 11/11/2021 0743  ? HGB 8.4 (L) 11/11/2021 0743  ? HGB 12.2 09/16/2021 0847  ? HCT 28.4 (L) 11/11/2021 0743  ? HCT 38.0 09/16/2021 0847  ? PLT 304 11/11/2021 0743  ? PLT 200 09/16/2021 0847  ? ? ?BMET ?   ?Component Value Date/Time  ? NA 134 (L) 11/13/2021 5366  ? NA 137 01/08/2018 1117  ? K 4.8 11/13/2021 0608  ? CL 102 11/13/2021 0608  ? CO2 26 11/13/2021 0608  ? GLUCOSE 161 (H) 11/13/2021 4403  ? BUN 51 (H) 11/13/2021 4742  ? BUN 31 (H) 01/08/2018 1117  ? CREATININE 1.34 (H) 11/13/2021 5956  ? CREATININE 1.59 (H) 07/18/2019 1506  ? CALCIUM 8.5 (L) 11/13/2021 3875  ? GFRNONAA 48 (L) 11/13/2021 6433  ? GFRAA 51 (L) 02/25/2020 1504  ? ? ?COAG ?Lab Results  ?Component Value Date  ? INR 1.4 (H) 11/04/2021  ? INR 1.2 09/20/2021  ? INR 1.0 03/21/2019  ? ?No results found for: PTT ? ?Antibiotics ?Anti-infectives (From admission, onward)  ? ? Start     Dose/Rate Route Frequency Ordered Stop  ? 11/09/21 0600  ertapenem (INVANZ) 1,000 mg in sodium chloride 0.9 % 100 mL IVPB       ? 1 g ?200 mL/hr over 30 Minutes Intravenous Every 24 hours 11/08/21 1649    ? 11/07/21 1400  ceFEPIme (MAXIPIME) 2 g in sodium chloride 0.9 % 100 mL IVPB       ? 2 g ?200 mL/hr over 30 Minutes Intravenous Every 8 hours 11/07/21 1226 11/08/21 2322  ? 11/06/21 1200  DAPTOmycin (CUBICIN) 500 mg in sodium chloride 0.9  % IVPB       ? 500 mg ?120 mL/hr over 30 Minutes Intravenous Daily 11/05/21 1657    ? 11/05/21 2200  cefTRIAXone (ROCEPHIN) 2 g in sodium chloride 0.9 % 100 mL IVPB  Status:  Discontinued       ? 2 g ?200 mL/hr over 30 Minutes Intravenous Every 24 hours 11/05/21 1657 11/07/21 1217  ? 11/05/21 2200  metroNIDAZOLE (FLAGYL) tablet 500 mg       ? 500 mg Oral Every 12 hours 11/05/21 1657 11/08/21 2233  ? 11/05/21 1100  vancomycin (VANCOCIN) IVPB 1000 mg/200 mL premix       ? 1,000 mg ?200 mL/hr over 60 Minutes Intravenous On call to O.R. 11/05/21 1013 11/05/21 1954  ? 11/05/21 0130  vancomycin IVPB  Status:  Discontinued       ?Note to Pharmacy: Indication:  MRSA bacteremia ?First Dose: Yes ?Last Day of Therapy:  11/07/2021 ?Labs - Monday:  CBC/D, CMP, and vancomycin trough.  ?Labs - Thursday:  BMP and vancomycin trough  ?  Please pull PIC at completion of IV antibiotics  ?Fax labs to [phone] ?Method of administration:Elastomeric ?Metho  ? 1,000 mg Intravenous Every 24 hours 11/05/21 0036 11/05/21 0040  ? 11/04/21 2015  vancomycin (VANCOCIN) IVPB 1000 mg/200 mL premix  Status:  Discontinued       ? 1,000 mg ?200 mL/hr over 60 Minutes Intravenous  Once 11/04/21 2014 11/04/21 2014  ? 11/04/21 1815  vancomycin (VANCOCIN) IVPB 1000 mg/200 mL premix       ? 1,000 mg ?200 mL/hr over 60 Minutes Intravenous  Once 11/04/21 1806 11/04/21 1944  ? ?  ? ? ? ?V. Leia Alf, M.D., FACS ?[facility] ?Office: [phone] ?Pager:  [phone]  ?

## 2021-11-14 DIAGNOSIS — L03115 Cellulitis of right lower limb: Secondary | ICD-10-CM | POA: Diagnosis not present

## 2021-11-14 LAB — GLUCOSE, CAPILLARY
Glucose-Capillary: 121 mg/dL — ABNORMAL HIGH (ref 70–99)
Glucose-Capillary: 125 mg/dL — ABNORMAL HIGH (ref 70–99)
Glucose-Capillary: 133 mg/dL — ABNORMAL HIGH (ref 70–99)
Glucose-Capillary: 177 mg/dL — ABNORMAL HIGH (ref 70–99)

## 2021-11-14 LAB — CRYOGLOBULIN

## 2021-11-14 NOTE — Plan of Care (Signed)
  Problem: Health Behavior/Discharge Planning: Goal: Ability to manage health-related needs will improve Outcome: Progressing   

## 2021-11-14 NOTE — Progress Notes (Addendum)
[place] at [facility] ? ? ?PATIENT NAME: [name]   ? ?MR#:  [id number] ? ?DATE OF BIRTH:  F[birth year] ? ?SUBJECTIVE:  ? ?denies any complaints. \ ?Plans for right BKA on monday ? ? ?VITALS:  ?Blood pressure 111/62, pulse 67, temperature 98 ?F (36.7 ?C), resp. rate 20, height '5\' 4"'$  (1.626 m), weight 109.3 kg, SpO2 99 %. ? ?PHYSICAL EXAMINATION:  ? ?GENERAL:  51 y.o.-year-old patient lying in the bed with no acute distress. Chronically ill ?LUNGS: Normal breath sounds bilaterally ?CARDIOVASCULAR: S1, S2 normal.  ?ABDOMEN: Soft, nontender, nondistended. Bowel sounds present.  ?EXTREMITIES:  ?Left stump ? ?Right Foot ?NEUROLOGIC: nonfocal  patient is alert and awake ?SKIN: above ? ?LABORATORY PANEL:  ?CBC ?Recent Labs  ?Lab 11/11/21 ?3086  ?WBC 7.9  ?HGB 8.4*  ?HCT 28.4*  ?PLT 304  ? ? ? ?Chemistries  ?Recent Labs  ?Lab 11/08/21 ?1928 11/09/21 ?5784 11/13/21 ?6962  ?NA 130*   < > 134*  ?K 5.2*   < > 4.8  ?CL 96*   < > 102  ?CO2 23   < > 26  ?GLUCOSE 281*   < > 161*  ?BUN 32*   < > 51*  ?CREATININE 1.20*   < > 1.34*  ?CALCIUM 8.0*   < > 8.5*  ?AST 13*  --   --   ?ALT 8  --   --   ?ALKPHOS 148*  --   --   ?BILITOT 0.6  --   --   ? < > = values in this interval not displayed.  ? ? ?Cardiac Enzymes ?No results for input(s): TROPONINI in the last 168 hours. ?RADIOLOGY:  ?No results found. ? ?Assessment and Plan ? ?[name] is a 51 year old Caucasian female with PMH CHF, CAD, DM 2, tobacco use, presenting with concern for wound infection on surgical site, she is status post BKA on 10/11/2021, currently being followed at home by wound care nurse.  Patient presented to the ED 11/04/2021 reports purulent drainage, warmth, pain at surgical site, on left stump, as well as wound on right heel ? ?Cellulitis of lower extremity ?Eschar Left BKA stump ?--Continue current IV antibiotics, ID is consulted, appreciate their recommendations as well ?--11/05/21 underwent Excisional debridement left  below-knee amputation stump including skin subcutaneous tissues muscle and bone and application of a wound VAC w/ vascular surgery ?--pt currently on IV Daptomycin + Ertapenem ? ?right heel gangrene/Diabetic foot ?PVD (peripheral vascular disease) ([place]) ? -- status post right lower extremity angiogram with stent placement in superficial femoral vein ?-- discussed with vascular surgery Dr. Delana Meyer will discussed with patient regarding right below knee amputation ?-- curb sided podiatry Dr. Sherryle Lis who took a look at the pictures and x-rays and recommended above ?--per Dr. Delana Meyer Right BKA in am ? ?Uncontrolled type 2 diabetes mellitus with hyperglycemia, with long-term current use of insulin ([place]) ?--SSI/ glycemic protocol. ?-- cont semglee ?  ?Chronic diastolic CHF (congestive heart failure) ([place]) ?--Last echo earlier this month done by dr Nehemiah Massed. ?--Cont her on aspirin, rosuvastatin, Aldactone. ?   ?Tobacco abuse ?--Nicotine patch. ?  ?Chronic kidney disease, stage 3a ([place]) ?--Creatinine today about baseline ?  ?H/o Epilepsy--Pt is not on AED's. ? ?Anemia--acute blood loss ?--Likely due to surgical blood loss ?--Patient is status posttransfusion 2 units PRBC on this hospitalization ?--hgb 8.2 ?  ?Rash  ?--See photos  ?-- appears to be fading. Etiology unclear. ?-- Non-itchy rash ? ?Body mass index is 41.36  kg/m?. ?Obesity--complicates prognosis and overall outcome ? ? ?Procedures: as above ?Family communication : none today ?Consults : ID, vascular ?CODE STATUS: full ?DVT Prophylaxis : heparin ?Level of care: Med-Surg ?Status is: Inpatient ?Remains inpatient appropriate because: severe peripheral vascular disease with amputation stump infection on the left and right heel gangrene that will require BKA on monday ?  ? ?TOTAL TIME TAKING CARE OF THIS PATIENT: 25 minutes.  ?>50% time spent on counselling and coordination of care ? ?Note: This dictation was prepared with Dragon dictation along with smaller phrase  technology. Any transcriptional errors that result from this process are unintentional. ? ?[name] M.D  ? ? ?[facility]  ? ?CC: ?Primary care physician; Pleas Koch, NP  ?

## 2021-11-15 ENCOUNTER — Inpatient Hospital Stay: Payer: BC Managed Care – PPO | Admitting: Anesthesiology

## 2021-11-15 ENCOUNTER — Encounter: Payer: Self-pay | Admitting: Osteopathic Medicine

## 2021-11-15 ENCOUNTER — Other Ambulatory Visit: Payer: Self-pay

## 2021-11-15 ENCOUNTER — Encounter
Admission: EM | Disposition: A | Discharge status: Home Health | Payer: Self-pay | Source: Home / Self Care | Attending: Internal Medicine

## 2021-11-15 DIAGNOSIS — E1165 Type 2 diabetes mellitus with hyperglycemia: Secondary | ICD-10-CM | POA: Diagnosis not present

## 2021-11-15 DIAGNOSIS — T8740 Infection of amputation stump, unspecified extremity: Secondary | ICD-10-CM | POA: Diagnosis not present

## 2021-11-15 DIAGNOSIS — T8743 Infection of amputation stump, right lower extremity: Secondary | ICD-10-CM | POA: Diagnosis not present

## 2021-11-15 DIAGNOSIS — M869 Osteomyelitis, unspecified: Secondary | ICD-10-CM

## 2021-11-15 DIAGNOSIS — L03115 Cellulitis of right lower limb: Secondary | ICD-10-CM | POA: Diagnosis not present

## 2021-11-15 DIAGNOSIS — I96 Gangrene, not elsewhere classified: Secondary | ICD-10-CM

## 2021-11-15 DIAGNOSIS — T148XXA Other injury of unspecified body region, initial encounter: Secondary | ICD-10-CM | POA: Diagnosis not present

## 2021-11-15 DIAGNOSIS — M86172 Other acute osteomyelitis, left ankle and foot: Secondary | ICD-10-CM | POA: Diagnosis not present

## 2021-11-15 DIAGNOSIS — I739 Peripheral vascular disease, unspecified: Secondary | ICD-10-CM | POA: Diagnosis not present

## 2021-11-15 HISTORY — PX: APPLICATION OF WOUND VAC: SHX5189

## 2021-11-15 HISTORY — PX: AMPUTATION: SHX166

## 2021-11-15 LAB — AEROBIC CULTURE W GRAM STAIN (SUPERFICIAL SPECIMEN)
Culture: NO GROWTH
Gram Stain: NONE SEEN

## 2021-11-15 LAB — CBC
HCT: 26.6 % — ABNORMAL LOW (ref 36.0–46.0)
Hemoglobin: 7.8 g/dL — ABNORMAL LOW (ref 12.0–15.0)
MCH: 24.6 pg — ABNORMAL LOW (ref 26.0–34.0)
MCHC: 29.3 g/dL — ABNORMAL LOW (ref 30.0–36.0)
MCV: 83.9 fL (ref 80.0–100.0)
Platelets: 278 10*3/uL (ref 150–400)
RBC: 3.17 MIL/uL — ABNORMAL LOW (ref 3.87–5.11)
RDW: 18.6 % — ABNORMAL HIGH (ref 11.5–15.5)
WBC: 6.9 10*3/uL (ref 4.0–10.5)
nRBC: 0 % (ref 0.0–0.2)

## 2021-11-15 LAB — HEMOGLOBIN AND HEMATOCRIT, BLOOD
HCT: 25.1 % — ABNORMAL LOW (ref 36.0–46.0)
Hemoglobin: 6.8 g/dL — ABNORMAL LOW (ref 12.0–15.0)

## 2021-11-15 LAB — GLUCOSE, CAPILLARY
Glucose-Capillary: 147 mg/dL — ABNORMAL HIGH (ref 70–99)
Glucose-Capillary: 145 mg/dL — ABNORMAL HIGH (ref 70–99)
Glucose-Capillary: 250 mg/dL — ABNORMAL HIGH (ref 70–99)
Glucose-Capillary: 248 mg/dL — ABNORMAL HIGH (ref 70–99)

## 2021-11-15 LAB — PREPARE RBC (CROSSMATCH)

## 2021-11-15 LAB — CK: Total CK: 60 U/L (ref 38–234)

## 2021-11-15 SURGERY — AMPUTATION BELOW KNEE
Anesthesia: General | Site: Leg Lower | Laterality: Right

## 2021-11-15 MED ORDER — PHENYLEPHRINE HCL-NACL 20-0.9 MG/250ML-% IV SOLN
INTRAVENOUS | Status: DC | PRN
  Administered 2021-11-15: 35 ug/min via INTRAVENOUS

## 2021-11-15 MED ORDER — FUROSEMIDE 10 MG/ML IJ SOLN
INTRAMUSCULAR | Status: AC
Start: 2021-11-15 — End: 2021-11-15
  Filled 2021-11-15: qty 2

## 2021-11-15 MED ORDER — SODIUM CHLORIDE 0.9 % IV SOLN: INTRAVENOUS | Status: DC | PRN

## 2021-11-15 MED ORDER — CHLORHEXIDINE GLUCONATE CLOTH 2 % EX PADS
6.0000 | MEDICATED_PAD | Freq: Once | CUTANEOUS | Status: AC
  Administered 2021-11-15: 6 via TOPICAL

## 2021-11-15 MED ORDER — ONDANSETRON HCL 4 MG/2ML IJ SOLN
INTRAMUSCULAR | Status: DC | PRN
Start: 2021-11-15 — End: 2021-11-15
  Administered 2021-11-15: 4 mg via INTRAVENOUS

## 2021-11-15 MED ORDER — CALCIUM CHLORIDE 10 % IV SOLN
INTRAVENOUS | Status: AC
Start: 2021-11-15 — End: ?
  Filled 2021-11-15: qty 10

## 2021-11-15 MED ORDER — DEXAMETHASONE SODIUM PHOSPHATE 10 MG/ML IJ SOLN
INTRAMUSCULAR | Status: DC | PRN
  Administered 2021-11-15: 5 mg via INTRAVENOUS

## 2021-11-15 MED ORDER — MIDAZOLAM HCL 2 MG/2ML IJ SOLN
INTRAMUSCULAR | Status: DC | PRN
Start: 2021-11-15 — End: 2021-11-15
  Administered 2021-11-15: 2 mg via INTRAVENOUS

## 2021-11-15 MED ORDER — FENTANYL CITRATE (PF) 100 MCG/2ML IJ SOLN: 25.0000 ug | INTRAMUSCULAR | Status: DC | PRN

## 2021-11-15 MED ORDER — PROPOFOL 10 MG/ML IV BOLUS
INTRAVENOUS | Status: AC
  Filled 2021-11-15: qty 20

## 2021-11-15 MED ORDER — LIDOCAINE HCL (PF) 2 % IJ SOLN
INTRAMUSCULAR | Status: AC
  Filled 2021-11-15: qty 5

## 2021-11-15 MED ORDER — FENTANYL CITRATE (PF) 100 MCG/2ML IJ SOLN
INTRAMUSCULAR | Status: AC
  Filled 2021-11-15: qty 2

## 2021-11-15 MED ORDER — ALBUMIN HUMAN 5 % IV SOLN: INTRAVENOUS | Status: DC | PRN

## 2021-11-15 MED ORDER — FUROSEMIDE 10 MG/ML IJ SOLN
20.0000 mg | Freq: Once | INTRAMUSCULAR | Status: AC
  Administered 2021-11-15: 20 mg via INTRAVENOUS

## 2021-11-15 MED ORDER — PROPOFOL 10 MG/ML IV BOLUS
INTRAVENOUS | Status: DC | PRN
Start: 2021-11-15 — End: 2021-11-15
  Administered 2021-11-15: 20 mg via INTRAVENOUS
  Administered 2021-11-15: 110 mg via INTRAVENOUS

## 2021-11-15 MED ORDER — ONDANSETRON HCL 4 MG/2ML IJ SOLN: 4.0000 mg | Freq: Once | INTRAMUSCULAR | Status: DC | PRN

## 2021-11-15 MED ORDER — ACETAMINOPHEN 10 MG/ML IV SOLN
INTRAVENOUS | Status: DC | PRN
  Administered 2021-11-15: 1000 mg via INTRAVENOUS

## 2021-11-15 MED ORDER — ACETAMINOPHEN 325 MG PO TABS
650.0000 mg | ORAL_TABLET | Freq: Once | ORAL | Status: DC
  Filled 2021-11-15: qty 2

## 2021-11-15 MED ORDER — FENTANYL CITRATE (PF) 100 MCG/2ML IJ SOLN
INTRAMUSCULAR | Status: DC | PRN
  Administered 2021-11-15 (×2): 25 ug via INTRAVENOUS

## 2021-11-15 MED ORDER — CALCIUM CHLORIDE 10 % IV SOLN
INTRAVENOUS | Status: DC | PRN
  Administered 2021-11-15 (×2): .5 g via INTRAVENOUS

## 2021-11-15 MED ORDER — MIDAZOLAM HCL 2 MG/2ML IJ SOLN
INTRAMUSCULAR | Status: AC
  Filled 2021-11-15: qty 2

## 2021-11-15 MED ORDER — ALBUMIN HUMAN 5 % IV SOLN
INTRAVENOUS | Status: AC
  Filled 2021-11-15: qty 250

## 2021-11-15 MED ORDER — DEXAMETHASONE SODIUM PHOSPHATE 10 MG/ML IJ SOLN
INTRAMUSCULAR | Status: AC
  Filled 2021-11-15: qty 1

## 2021-11-15 MED ORDER — 0.9 % SODIUM CHLORIDE (POUR BTL) OPTIME
TOPICAL | Status: DC | PRN
Start: 2021-11-15 — End: 2021-11-15
  Administered 2021-11-15 (×3): 500 mL

## 2021-11-15 MED ORDER — ONDANSETRON HCL 4 MG/2ML IJ SOLN
INTRAMUSCULAR | Status: AC
  Filled 2021-11-15: qty 2

## 2021-11-15 MED ORDER — ACETAMINOPHEN 10 MG/ML IV SOLN
INTRAVENOUS | Status: AC
  Filled 2021-11-15: qty 100

## 2021-11-15 MED ORDER — LIDOCAINE HCL (CARDIAC) PF 100 MG/5ML IV SOSY
PREFILLED_SYRINGE | INTRAVENOUS | Status: DC | PRN
  Administered 2021-11-15: 80 mg via INTRAVENOUS

## 2021-11-15 MED ORDER — PHENYLEPHRINE HCL-NACL 20-0.9 MG/250ML-% IV SOLN
INTRAVENOUS | Status: AC
  Filled 2021-11-15: qty 250

## 2021-11-15 MED ORDER — BUPIVACAINE LIPOSOME 1.3 % IJ SUSP
INTRAMUSCULAR | Status: DC | PRN
  Administered 2021-11-15: 50 mL

## 2021-11-15 MED ORDER — SODIUM CHLORIDE 0.9% IV SOLUTION: Freq: Once | INTRAVENOUS | Status: DC

## 2021-11-15 MED ORDER — BUPIVACAINE HCL (PF) 0.5 % IJ SOLN
INTRAMUSCULAR | Status: AC
  Filled 2021-11-15: qty 30

## 2021-11-15 MED ORDER — ROCURONIUM BROMIDE 10 MG/ML (PF) SYRINGE
PREFILLED_SYRINGE | INTRAVENOUS | Status: AC
  Filled 2021-11-15: qty 10

## 2021-11-15 MED ORDER — BUPIVACAINE LIPOSOME 1.3 % IJ SUSP
INTRAMUSCULAR | Status: AC
  Filled 2021-11-15: qty 20

## 2021-11-15 MED ORDER — SODIUM CHLORIDE (PF) 0.9 % IJ SOLN
INTRAMUSCULAR | Status: AC
  Filled 2021-11-15: qty 50

## 2021-11-15 MED ORDER — DIPHENHYDRAMINE HCL 50 MG/ML IJ SOLN
INTRAMUSCULAR | Status: AC
  Filled 2021-11-15: qty 1

## 2021-11-15 MED ORDER — DIPHENHYDRAMINE HCL 50 MG/ML IJ SOLN
25.0000 mg | Freq: Once | INTRAMUSCULAR | Status: AC
  Administered 2021-11-15: 25 mg via INTRAVENOUS

## 2021-11-15 SURGICAL SUPPLY — 48 items
BLADE SAW SAG 25.4X90 (BLADE) ×4 IMPLANT
BLADE SURG SZ10 CARB STEEL (BLADE) ×4 IMPLANT
BNDG COHESIVE 4X5 TAN ST LF (GAUZE/BANDAGES/DRESSINGS) ×4 IMPLANT
BNDG ELASTIC 6X5.8 VLCR NS LF (GAUZE/BANDAGES/DRESSINGS) ×4 IMPLANT
BNDG GAUZE ELAST 4 BULKY (GAUZE/BANDAGES/DRESSINGS) ×4 IMPLANT
CANISTER WOUND CARE 500ML ATS (WOUND CARE) ×2 IMPLANT
CHLORAPREP W/TINT 26 (MISCELLANEOUS) ×4 IMPLANT
DERMABOND ADVANCED (GAUZE/BANDAGES/DRESSINGS) ×1
DERMABOND ADVANCED .7 DNX12 (GAUZE/BANDAGES/DRESSINGS) ×3 IMPLANT
DRAPE INCISE IOBAN 66X45 STRL (DRAPES) ×4 IMPLANT
DRSG GAUZE FLUFF 36X18 (GAUZE/BANDAGES/DRESSINGS) ×4 IMPLANT
DRSG VAC ATS MED SENSATRAC (GAUZE/BANDAGES/DRESSINGS) ×5 IMPLANT
ELECT CAUTERY BLADE 6.4 (BLADE) ×4 IMPLANT
ELECT REM PT RETURN 9FT ADLT (ELECTROSURGICAL) ×4
ELECTRODE REM PT RTRN 9FT ADLT (ELECTROSURGICAL) ×3 IMPLANT
GAUZE SPONGE 4X4 12PLY STRL (GAUZE/BANDAGES/DRESSINGS) ×2 IMPLANT
GLOVE SURG SYN 8.0 (GLOVE) ×4 IMPLANT
GLOVE SURG SYN 8.0 PF PI (GLOVE) ×2 IMPLANT
GOWN STRL REUS W/ TWL LRG LVL3 (GOWN DISPOSABLE) ×3 IMPLANT
GOWN STRL REUS W/ TWL XL LVL3 (GOWN DISPOSABLE) ×3 IMPLANT
GOWN STRL REUS W/TWL LRG LVL3 (GOWN DISPOSABLE) ×4
GOWN STRL REUS W/TWL XL LVL3 (GOWN DISPOSABLE) ×4
HANDLE YANKAUER SUCT BULB TIP (MISCELLANEOUS) ×4 IMPLANT
KIT TURNOVER KIT A (KITS) ×4 IMPLANT
MANIFOLD NEPTUNE II (INSTRUMENTS) ×4 IMPLANT
NDL HYPO 21X1.5 SAFETY (NEEDLE) ×2 IMPLANT
NEEDLE HYPO 21X1.5 SAFETY (NEEDLE) ×4 IMPLANT
NS IRRIG 500ML POUR BTL (IV SOLUTION) ×4 IMPLANT
PACK EXTREMITY ARMC (MISCELLANEOUS) ×4 IMPLANT
PAD PREP 24X41 OB/GYN DISP (PERSONAL CARE ITEMS) ×4 IMPLANT
SPONGE T-LAP 18X18 ~~LOC~~+RFID (SPONGE) ×8 IMPLANT
STAPLER SKIN PROX 35W (STAPLE) IMPLANT
STOCKINETTE M/LG 89821 (MISCELLANEOUS) ×4 IMPLANT
SUT ETHIBOND 0 36 GRN (SUTURE) IMPLANT
SUT MNCRL 4-0 (SUTURE) ×4
SUT MNCRL 4-0 27XMFL (SUTURE) ×3
SUT SILK 2 0 (SUTURE)
SUT SILK 2-0 18XBRD TIE 12 (SUTURE) IMPLANT
SUT SILK 3 0 (SUTURE) ×4
SUT SILK 3-0 18XBRD TIE 12 (SUTURE) ×3 IMPLANT
SUT VIC AB 0 CT1 36 (SUTURE) ×22 IMPLANT
SUT VIC AB 3-0 SH 27 (SUTURE) ×8
SUT VIC AB 3-0 SH 27X BRD (SUTURE) ×6 IMPLANT
SUT VICRYL PLUS ABS 0 54 (SUTURE) ×2 IMPLANT
SUTURE MNCRL 4-0 27XMF (SUTURE) ×3 IMPLANT
SYR 20ML LL LF (SYRINGE) ×8 IMPLANT
TAPE UMBILICAL 1/8 X36 TWILL (MISCELLANEOUS) ×4 IMPLANT
WATER STERILE IRR 500ML POUR (IV SOLUTION) ×4 IMPLANT

## 2021-11-15 NOTE — Transfer of Care (Signed)
Immediate Anesthesia Transfer of Care Note ? ?Patient: [name] ? ?Procedure(s) Performed: BELOW KNEE AMPUTATION (Right: Leg Lower) ?APPLICATION OF WOUND VAC (Left: Leg Lower) ? ?Patient Location: PACU ? ?Anesthesia Type:General ? ?Level of Consciousness: drowsy ? ?Airway & Oxygen Therapy: Patient Spontanous Breathing and Patient connected to face mask ? ?Post-op Assessment: Report given to RN and Post -op Vital signs reviewed and stable ? ?Post vital signs: Reviewed and stable ? ?Last Vitals:  ?Vitals Value Taken Time  ?BP 107/60 11/15/21 0950  ?Temp 37.2 ?C 11/15/21 0950  ?Pulse 66 11/15/21 0955  ?Resp 10 11/15/21 0955  ?SpO2 100 % 11/15/21 0955  ? ? ?Last Pain:  ?Vitals:  ? 11/15/21 0950  ?TempSrc:   ?PainSc: Asleep  ?   ? ?Patients Stated Pain Goal: 2 (11/13/21 1800) ? ?Complications: No notable events documented. ?

## 2021-11-15 NOTE — Plan of Care (Signed)
?  Problem: Education: ?Goal: Knowledge of General Education information will improve ?Description: Including pain rating scale, medication(s)/side effects and non-pharmacologic comfort measures ?Outcome: Progressing ?  ?Problem: Health Behavior/Discharge Planning: ?Goal: Ability to manage health-related needs will improve ?Outcome: Progressing ?  ?Problem: Clinical Measurements: ?Goal: Ability to maintain clinical measurements within normal limits will improve ?Outcome: Progressing ?Goal: Will remain free from infection ?Outcome: Progressing ?Goal: Diagnostic test results will improve ?Outcome: Progressing ?Goal: Respiratory complications will improve ?Outcome: Progressing ?Goal: Cardiovascular complication will be avoided ?Outcome: Progressing ?  ?Problem: Activity: ?Goal: Risk for activity intolerance will decrease ?Outcome: Progressing ?  ?Problem: Nutrition: ?Goal: Adequate nutrition will be maintained ?Outcome: Progressing ?  ?Problem: Coping: ?Goal: Level of anxiety will decrease ?Outcome: Progressing ?  ?Problem: Elimination: ?Goal: Will not experience complications related to bowel motility ?Outcome: Progressing ?Goal: Will not experience complications related to urinary retention ?Outcome: Progressing ?  ?Problem: Pain Managment: ?Goal: General experience of comfort will improve ?Outcome: Progressing ?  ?Problem: Safety: ?Goal: Ability to remain free from injury will improve ?Outcome: Progressing ?  ?Problem: Education: ?Goal: Ability to describe self-care measures that may prevent or decrease complications (Diabetes Survival Skills Education) will improve ?Outcome: Progressing ?Goal: Individualized Educational Video(s) ?Outcome: Progressing ?  ?Problem: Coping: ?Goal: Ability to adjust to condition or change in health will improve ?Outcome: Progressing ?  ?Problem: Fluid Volume: ?Goal: Ability to maintain a balanced intake and output will improve ?Outcome: Progressing ?  ?Problem: Health Behavior/Discharge  Planning: ?Goal: Ability to identify and utilize available resources and services will improve ?Outcome: Progressing ?Goal: Ability to manage health-related needs will improve ?Outcome: Progressing ?  ?Problem: Metabolic: ?Goal: Ability to maintain appropriate glucose levels will improve ?Outcome: Progressing ?  ?Problem: Nutritional: ?Goal: Maintenance of adequate nutrition will improve ?Outcome: Progressing ?Goal: Progress toward achieving an optimal weight will improve ?Outcome: Progressing ?  ?Problem: Skin Integrity: ?Goal: Risk for impaired skin integrity will decrease ?Outcome: Progressing ?  ?Problem: Tissue Perfusion: ?Goal: Adequacy of tissue perfusion will improve ?Outcome: Progressing ?  ?Problem: Clinical Measurements: ?Goal: Ability to avoid or minimize complications of infection will improve ?Outcome: Progressing ?  ?Problem: Skin Integrity: ?Goal: Skin integrity will improve ?Outcome: Progressing ?  ?Problem: Education: ?Goal: Ability to describe self-care measures that may prevent or decrease complications (Diabetes Survival Skills Education) will improve ?Outcome: Progressing ?Goal: Individualized Educational Video(s) ?Outcome: Progressing ?  ?Problem: Coping: ?Goal: Ability to adjust to condition or change in health will improve ?Outcome: Progressing ?  ?Problem: Fluid Volume: ?Goal: Ability to maintain a balanced intake and output will improve ?Outcome: Progressing ?  ?Problem: Health Behavior/Discharge Planning: ?Goal: Ability to identify and utilize available resources and services will improve ?Outcome: Progressing ?Goal: Ability to manage health-related needs will improve ?Outcome: Progressing ?  ?Problem: Metabolic: ?Goal: Ability to maintain appropriate glucose levels will improve ?Outcome: Progressing ?  ?Problem: Nutritional: ?Goal: Maintenance of adequate nutrition will improve ?Outcome: Progressing ?Goal: Progress toward achieving an optimal weight will improve ?Outcome: Progressing ?   ?Problem: Skin Integrity: ?Goal: Risk for impaired skin integrity will decrease ?Outcome: Progressing ?  ?Problem: Tissue Perfusion: ?Goal: Adequacy of tissue perfusion will improve ?Outcome: Progressing ?  ?

## 2021-11-15 NOTE — Anesthesia Preprocedure Evaluation (Addendum)
Anesthesia Evaluation  ?Patient identified by MRN, date of birth, ID band ?Patient awake ? ? ? ?Reviewed: ?Allergy & Precautions, H&P , NPO status , Patient's Chart, lab work & pertinent test results, reviewed documented beta blocker date and time  ? ?History of Anesthesia Complications ?Negative for: history of anesthetic complications ? ?Airway ?Mallampati: II ? ?TM Distance: >3 FB ?Neck ROM: full ? ? ? Dental ? ?(+) Edentulous Upper, Edentulous Lower, Dental Advidsory Given ?  ?Pulmonary ?shortness of breath and with exertion, asthma , sleep apnea and Continuous Positive Airway Pressure Ventilation , COPD,  COPD inhaler and oxygen dependent, neg recent URI, Current Smoker and Patient abstained from smoking.,  ?  ? ?+ decreased breath sounds ? ? ? ? ? Cardiovascular ?Exercise Tolerance: Poor ?hypertension, On Medications ?(-) angina+ CAD, + Peripheral Vascular Disease, +CHF and + Orthopnea  ?Normal cardiovascular exam+ dysrhythmias  ?Rhythm:Regular Rate:Normal ? ? ?  ?Neuro/Psych ? Headaches, Seizures -,  PSYCHIATRIC DISORDERS Anxiety Depression Bipolar Disorder TIA Neuromuscular disease CVA, Residual Symptoms   ? GI/Hepatic ?Neg liver ROS, GERD  Medicated,  ?Endo/Other  ?diabetes ? Renal/GU ?Renal disease  ?negative genitourinary ?  ?Musculoskeletal ? ? Abdominal ?  ?Peds ? Hematology ?negative hematology ROS ?(+)   ?Anesthesia Other Findings ?Past Medical History: ?No date: Anxiety ?    Comment:  takes Prozac daily ?No date: Anxiety ?No date: Aortic valve calcification ?No date: Asthma ?    Comment:  Advair and Spirva daily ?No date: Asthma ?No date: Bipolar disorder ([place]) ?12/23/2019: Breast cancer, right breast ([place]) ?No date: CAD (coronary artery disease) ?    Comment:  a. LHC 11/2013 done for CP/fluid retention: mild disease  ?             in prox LAD, mild-mod disease in mRCA, EF 60% with normal ?             LVEDP. b. Normal nuc 03/2016. ?No date: Cancer [facility]) ?03/12/2020:  Cellulitis and abscess of trunk ?No date: CHF (congestive heart failure) ([place]) ?No date: Chronic diastolic CHF (congestive heart failure) ([place]) ?11/16/2018: Chronic heart failure with preserved ejection fraction ([name]) ?No date: CKD (chronic kidney disease), stage II ?No date: COPD (chronic obstructive pulmonary disease) ([place]) ?    Comment:  a. nocturnal O2. ?No date: COPD (chronic obstructive pulmonary disease) ([place]) ?No date: Coronary artery disease ?No date: Decreased urine stream ?No date: Diabetes mellitus ?No date: Diabetes mellitus without complication ([place]) ?No date: Dyspnea ?01/29/2018: Elevated C-reactive protein (CRP) ?01/29/2018: Elevated sedimentation rate ?01/19/2018: Elevated serum hCG ?No date: Family history of adverse reaction to anesthesia ?    Comment:  mom gets nauseated ?05/22/2018: Foot pain, bilateral ?No date: GERD (gastroesophageal reflux disease) ?    Comment:  takes Pepcid daily ?No date: History of blood clots ?    Comment:  left leg 3-38yr ago ?No date: Hyperlipidemia ?No date: Hypertension ?No date: Hypertriglyceridemia ?01/2015: Inguinal hernia, left ?No date: Muscle spasm ?No date: Open wound of genital labia ?No date: Peripheral neuropathy ?No date: RBBB ?No date: Seizures ([place] ?01/19/2018: Sepsis ([place] ?No date: Sinus tachycardia ?    Comment:  a. persistent since 2009. ?No date: Smokers' cough ([place] ?01/14/2020: Status post mastectomy, right ?1989: Stroke ([place] ?    Comment:  left sided weakness ?No date: TIA (transient ischemic attack) ?No date: Tobacco abuse ?01/23/2018: Vulvar abscess ? ? Reproductive/Obstetrics ?negative OB ROS ? ?  ? ? ? ? ? ? ? ? ? ? ? ? ? ?  ?  ? ? ? ? ? ? ? ? ?  Anesthesia Physical ? ?Anesthesia Plan ? ?ASA: 4 ? ?Anesthesia Plan: General  ? ?Post-op Pain Management: Ofirmev IV (intra-op)*  ? ?Induction: Intravenous ? ?PONV Risk Score and Plan: 4 or greater and Ondansetron, Dexamethasone, Midazolam, Treatment may vary due to age or medical condition, TIVA and Propofol  infusion ? ?Airway Management Planned: LMA ? ?Additional Equipment: None ? ?Intra-op Plan:  ? ?Post-operative Plan: Extubation in OR ? ?Informed Consent: I have reviewed the patients History and Physical, chart, labs and discussed the procedure including the risks, benefits and alternatives for the proposed anesthesia with the patient or authorized representative who has indicated his/her understanding and acceptance.  ? ?Patient has DNR.  ?Discussed DNR with patient and Suspend DNR. ?  ?Dental advisory given ? ?Plan Discussed with: CRNA ? ?Anesthesia Plan Comments: (Discussed risks of anesthesia with patient, including PONV, sore throat, lip/dental/eye damage. Rare risks discussed as well, such as cardiorespiratory and neurological sequelae, and allergic reactions. Discussed the role of CRNA in patient's perioperative care. Patient understands. ?Patient counseled on being higher risk for anesthesia due to comorbidities: o2-dependent COPD. Patient was told about increased risk of cardiac and respiratory events, including death. ?)  ? ? ? ? ? ?Anesthesia Quick Evaluation ? ?

## 2021-11-15 NOTE — Progress Notes (Signed)
[place] at [facility] ? ? ?PATIENT NAME: [name]   ? ?MR#:  [id number] ? ?DATE OF BIRTH:  22-Dec-1970 ? ?SUBJECTIVE:  ? ?denies any complaints.  ?S/p right BKA ? no family ? ?VITALS:  ?Blood pressure 125/66, pulse 64, temperature 97.9 ?F (36.6 ?C), temperature source Oral, resp. rate 18, height '5\' 4"'$  (1.626 m), weight 111.5 kg, SpO2 100 %. ? ?PHYSICAL EXAMINATION:  ? ?GENERAL:  51 y.o.-year-old patient lying in the bed with no acute distress. Chronically ill ?LUNGS: Normal breath sounds bilaterally ?CARDIOVASCULAR: S1, S2 normal.  ?ABDOMEN: Soft, nontender, nondistended. Bowel sounds present.  ?EXTREMITIES:  ?Left stump ? ?Right Foot s/p BKA--dressing + ?NEUROLOGIC: nonfocal  patient is alert and awake ?SKIN: above ? ?LABORATORY PANEL:  ?CBC ?Recent Labs  ?Lab 11/15/21 ?0865 11/15/21 ?0900  ?WBC 6.9  --   ?HGB 7.8* 6.8*  ?HCT 26.6* 25.1*  ?PLT 278  --   ? ? ? ?Chemistries  ?Recent Labs  ?Lab 11/08/21 ?1928 11/09/21 ?7846 11/13/21 ?9629  ?NA 130*   < > 134*  ?K 5.2*   < > 4.8  ?CL 96*   < > 102  ?CO2 23   < > 26  ?GLUCOSE 281*   < > 161*  ?BUN 32*   < > 51*  ?CREATININE 1.20*   < > 1.34*  ?CALCIUM 8.0*   < > 8.5*  ?AST 13*  --   --   ?ALT 8  --   --   ?ALKPHOS 148*  --   --   ?BILITOT 0.6  --   --   ? < > = values in this interval not displayed.  ? ? ?Cardiac Enzymes ?No results for input(s): TROPONINI in the last 168 hours. ?RADIOLOGY:  ?No results found. ? ?Assessment and Plan ? ?[name] is a 51 year old Caucasian female with PMH CHF, CAD, DM 2, tobacco use, presenting with concern for wound infection on surgical site, she is status post BKA on 10/11/2021, currently being followed at home by wound care nurse.  Patient presented to the ED 11/04/2021 reports purulent drainage, warmth, pain at surgical site, on left stump, as well as wound on right heel ? ?Cellulitis of lower extremity ?Eschar Left BKA stump ?--Continue current IV antibiotics, ID is consulted, appreciate their  recommendations as well ?--11/05/21 underwent Excisional debridement left below-knee amputation stump including skin subcutaneous tissues muscle and bone and application of a wound VAC w/ vascular surgery ?--pt currently on IV Daptomycin + Ertapenem ? ?right heel gangrene/Diabetic foot ?PVD (peripheral vascular disease) ([place]) ? -- status post right lower extremity angiogram with stent placement in superficial femoral vein ?-- discussed with vascular surgery Dr. Delana Meyer will discussed with patient regarding right below knee amputation ?-- curb sided podiatry Dr. Sherryle Lis who took a look at the pictures and x-rays and recommended above ?--per Dr. Delana Meyer Right [place]  11/15/2021 ? ?Uncontrolled type 2 diabetes mellitus with hyperglycemia, with long-term current use of insulin ([place]) ?--SSI/ glycemic protocol. ?-- cont semglee ?  ?Chronic diastolic CHF (congestive heart failure) ([place]) ?--Last echo earlier this month done by dr Nehemiah Massed. ?--Cont her on aspirin, rosuvastatin, Aldactone. ?   ?Tobacco abuse ?--Nicotine patch. ?  ?Chronic kidney disease, stage 3a ([place]) ?--Creatinine today about baseline ?  ?H/o Epilepsy--Pt is not on AED's. ? ?Anemia--acute blood loss ?--Likely due to surgical blood loss ?--Patient is status posttransfusion 2 units PRBC on this hospitalization ?--hgb 8.2 ?--hgb 6.8--s/p BT today ?  ?Body mass  index is 42.19 kg/m?. ?Obesity--complicates prognosis and overall outcome ? ? ?Procedures: as above ?Family communication : none today ?Consults : ID, vascular ?CODE STATUS: full ?DVT Prophylaxis : heparin ?Level of care: Med-Surg ?Status is: Inpatient ?Remains inpatient appropriate because: severe peripheral vascular disease with amputation stump infection on the left and right heel gangrene that will require BKA on monday ?  ? ?TOTAL TIME TAKING CARE OF THIS PATIENT: 25 minutes.  ?>50% time spent on counselling and coordination of care ? ?Note: This dictation was prepared with Dragon dictation along with  smaller phrase technology. Any transcriptional errors that result from this process are unintentional. ? ?[name] M.D  ? ? ?[facility]  ? ?CC: ?Primary care physician; Pleas Koch, NP  ?

## 2021-11-15 NOTE — Progress Notes (Signed)
PRBC infusion complete VSS patient tolerated well. Will continue to monitor. ?

## 2021-11-15 NOTE — Interval H&P Note (Signed)
History and Physical Interval Note: ? ?11/15/2021 ?7:22 AM ? ?Matalie Beevers  has presented today for surgery, with the diagnosis of Above Knee Amputation ?Wound Vac Change.  The various methods of treatment have been discussed with the patient and family. After consideration of risks, benefits and other options for treatment, the patient has consented to  Procedure(s): ?AMPUTATION ABOVE KNEE (Right) ?APPLICATION OF WOUND VAC (Left) as a surgical intervention.  The patient's history has been reviewed, patient examined, no change in status, stable for surgery.  I have reviewed the patient's chart and labs.  Questions were answered to the patient's satisfaction.   ? ? ?[name] ? ? ?

## 2021-11-15 NOTE — Anesthesia Procedure Notes (Signed)
Procedure Name: LMA Insertion ?Date/Time: 11/15/2021 7:45 AM ?Performed by: Loletha Grayer, CRNA ?Pre-anesthesia Checklist: Patient identified, Patient being monitored, Timeout performed, Emergency Drugs available and Suction available ?Patient Re-evaluated:Patient Re-evaluated prior to induction ?Oxygen Delivery Method: Circle system utilized ?Preoxygenation: Pre-oxygenation with 100% oxygen ?Induction Type: IV induction ?Ventilation: Mask ventilation without difficulty ?LMA: LMA inserted ?LMA Size: 4.0 ?Number of attempts: 2 ?Placement Confirmation: positive ETCO2 and breath sounds checked- equal and bilateral ?Tube secured with: Tape ?Dental Injury: Teeth and Oropharynx as per pre-operative assessment  ?Comments: LMA AirQ 3.5 inserted x1-atraumatic, poor seal. LMA 4 x1 attempt-atraumatic. Good seal.  ? ? ? ? ?

## 2021-11-15 NOTE — Progress Notes (Signed)
ID ?She had rt BKA debridement of left BKA today ?Vac left BKA site ?Doing well ?Awake and alert ?No specific complaints ? ? ?O/E ?Awake and alert ?BP (!) 103/54 (BP Location: Left Arm)   Pulse 63   Temp 97.6 ?F (36.4 ?C) (Oral)   Resp 18   Ht '5\' 4"'$  (1.626 m)   Wt 111.5 kg   LMP  (LMP Unknown)   SpO2 90%   BMI 42.19 kg/m?   ?Chest bilateral air entry ?Heart sound S1-S2  ?abdomen soft ?Wound vac left stump ?Rt BKA- surgical dressing ?CNS non focal ? ? ? ? ? ?Labs ? ?  Latest Ref Rng & Units 11/15/2021  ?  9:00 AM 11/15/2021  ?  5:14 AM 11/11/2021  ?  7:43 AM  ?CBC  ?WBC 4.0 - 10.5 K/uL  6.9   7.9    ?Hemoglobin 12.0 - 15.0 g/dL 6.8   7.8   8.4    ?Hematocrit 36.0 - 46.0 % 25.1   26.6   28.4    ?Platelets 150 - 400 K/uL  278   304    ?  ? ?  Latest Ref Rng & Units 11/13/2021  ?  6:08 AM 11/11/2021  ?  7:43 AM 11/10/2021  ?  4:34 AM  ?CMP  ?Glucose 70 - 99 mg/dL 161   143   189    ?BUN 6 - 20 mg/dL 51   32   33    ?Creatinine 0.44 - 1.00 mg/dL 1.34   0.98   1.06    ?Sodium 135 - 145 mmol/L 134   134   132    ?Potassium 3.5 - 5.1 mmol/L 4.8   5.3   5.5    ?Chloride 98 - 111 mmol/L 102   103   101    ?CO2 22 - 32 mmol/L '26   27   26    '$ ?Calcium 8.9 - 10.3 mg/dL 8.5   8.5   8.6    ?  ? ?Micro ?Wound culture from left washout-enterobacter ?Rt heel wound culture- kleb, enterobacter and staph  ? ? ? ?Tongue-tie posterior calcaneus fracture with severe displacement and extension of the fracture line towards the posterior subtalar joint. ? ? ?Impression/recommendation ?Left BKA stump infection status post debridement and placement of wound VAC ?Cultures growing Enterobacter ?Currently on  ertapenem.   ? ? ?Right heel unstageable wound with eschar.  This is growing multiple organisms including Klebsiella Enterobacter and MRSA  ?On x-ray there is Tongue-type posterior calcaneus fracture with severe displacement and extension of the fracture line towards the posterior subtalar joint. underwent angio  and had angioplasty of the  superficial femoral artery on the right side with stent placement. ?Underwent  BKA today ?Continue daptomycin until  11/19/21 ?Continue ertapenem for gram neg coverage more for left BKA (( may think of doing cipro Po) ? ? ?Recent MRSA bacteremia and MRSA wounds- was getting 6 weeks of Iv antibiotic and end date was 11/07/21 ? ? ?Diabetes mellitus on insulin ? ?Severe deconditioning ? ?Ca breast status post right mastectomy on anastrozole ? ?Severe anemia has received PRBC ? ?Anxiety/depression on BuSpar, citalopram, desvenlafaxine and trazodone ? ?Discussed with patient and care team ? ? ? ?

## 2021-11-15 NOTE — Progress Notes (Signed)
Purewick placed on patient.

## 2021-11-15 NOTE — Op Note (Addendum)
[facility]   OPERATIVE NOTE   PROCEDURE:    1.  Right below-the-knee amputation.    2.  Debridement left below-knee amputation stump    3.  Application of a VAC dressing left below-knee amputation stump  PRE-OPERATIVE DIAGNOSIS: Right foot gangrene with osteomyelitis of the heel; history of gangrene left lower extremity status post left below-knee amputation with postoperative abscess  POST-OPERATIVE DIAGNOSIS: same as above  SURGEON:  Katha Cabal, MD  ASSISTANT(S): None  ANESTHESIA: general  ESTIMATED BLOOD LOSS: 1000 cc  FINDING(S): Rather marked venous hypertension of the right lower extremity with fibrotic muscle bellies noted as well on the right.  On the left there was an area of devitalized tissue laterally which was debrided and then the VAC applied  SPECIMEN(S):  Right below-the-knee amputation  INDICATIONS:   [name] is a 51 y.o. female who presents with right heel gangrene.  The patient is scheduled for a right below-the-knee amputation.  I discussed in depth with the patient the risks, benefits, and alternatives to this procedure.  The patient is aware that the risk of this operation included but are not limited to:  bleeding, infection, myocardial infarction, stroke, death, failure to heal amputation wound, and possible need for more proximal amputation.  The patient is aware of the risks and agrees proceed forward with the procedure.  DESCRIPTION: After full informed written consent was obtained from the patient, the patient was brought back to the operating room, and placed supine upon the operating table.  Prior to induction, the patient received IV antibiotics.  The patient was then prepped and draped in the standard fashion for a below-the-knee amputation.  After obtaining adequate anesthesia, the patient was prepped and draped in the standard fashion for a right below-the-knee amputation.  I marked out the anterior incision two  finger breadths below the tibial tuberosity and then the marked out a posterior flap that was one third of the circumference of the calf in length.   I made the incisions for these flaps, and then dissected through the subcutaneous tissue, fascia, and muscle anteriorly.  I elevated  the periosteal tissue superiorly so that the tibia was about 3-4 cm shorter than the anterior skin flap.  I then transected the tibia with a power saw and then took a wedge off the tibia anteriorly with the power saw.  Then I smoothed out the rough edges.  In a similar fashion, I cut back the fibula about two centimeters higher than the level of the tibia with a bone cutter.  I put a bone hook into the distal tibia and then used a large amputation knife to sharply develop a tissue plane through the muscle along the fibula.  In such fashion, the posterior flap was developed.  At this point, the specimen was passed off the field as the below-the-knee amputation.  At this point, I clamped all visibly bleeding arteries and veins using a combination of suture ligation with Silk suture and electrocautery.  Bleeding continued to be controlled with electrocautery and suture ligature.  The stump was washed off with sterile normal saline and no further active bleeding was noted.  I reapproximated the anterior and posterior fascia  with interrupted stitches of 0 Vicryl.  This was completed along the entire length of anterior and posterior fascia until there were no more loose space in the fascial line. I then placed a layer of 2-0 Vicryl sutures in the subcutaneous tissue. The skin was  then  reapproximated with staples.  The stump was washed off and dried.  The incision was dressed with Xeroform and  then fluffs were applied.  Kerlix was wrapped around the leg and then gently an ACE wrap was applied. Charlie Pitter was then used to wrap the dressing.   Attention was then turned to the left below-knee amputation stump.  The existing VAC dressing was  removed.  Upon inspection of the wound laterally there was an area of devitalized tissue that was debrided with Metzenbaum scissors.  Once viable bleeding tissue had been exposed the wound was remeasured.  At this point the wound now measures 10 cm in length by 6 cm in width and 1 cm in depth.  Hemostasis was achieved and a black VAC dressing sponge was applied with excellent seal.   COMPLICATIONS: none  CONDITION: stable  [name]  11/15/2021, 9:38 AM   This note was created with Dragon Medical transcription system. Any errors in dictation are purely unintentional.

## 2021-11-16 ENCOUNTER — Encounter: Payer: Self-pay | Admitting: Vascular Surgery

## 2021-11-16 DIAGNOSIS — E1165 Type 2 diabetes mellitus with hyperglycemia: Secondary | ICD-10-CM | POA: Diagnosis not present

## 2021-11-16 DIAGNOSIS — I739 Peripheral vascular disease, unspecified: Secondary | ICD-10-CM | POA: Diagnosis not present

## 2021-11-16 DIAGNOSIS — L03115 Cellulitis of right lower limb: Secondary | ICD-10-CM | POA: Diagnosis not present

## 2021-11-16 DIAGNOSIS — T148XXA Other injury of unspecified body region, initial encounter: Secondary | ICD-10-CM | POA: Diagnosis not present

## 2021-11-16 LAB — GLUCOSE, CAPILLARY
Glucose-Capillary: 211 mg/dL — ABNORMAL HIGH (ref 70–99)
Glucose-Capillary: 228 mg/dL — ABNORMAL HIGH (ref 70–99)
Glucose-Capillary: 225 mg/dL — ABNORMAL HIGH (ref 70–99)
Glucose-Capillary: 231 mg/dL — ABNORMAL HIGH (ref 70–99)

## 2021-11-16 LAB — HEMOGLOBIN: Hemoglobin: 7 g/dL — ABNORMAL LOW (ref 12.0–15.0)

## 2021-11-16 MED ORDER — MORPHINE SULFATE (PF) 2 MG/ML IV SOLN: 2.0000 mg | INTRAVENOUS | Status: DC | PRN

## 2021-11-16 NOTE — Progress Notes (Signed)
[name] ? ? ?PATIENT NAME: [name]   ? ?MR#:  [id number] ? ?DATE OF BIRTH:  January 30, 1971 ? ?SUBJECTIVE:  ? ?denies any complaints.  ?S/p right BKA ? no family at bedside ? ?VITALS:  ?Blood pressure 116/71, pulse 65, temperature 97.7 ?F (36.5 ?C), resp. rate 20, height '5\' 4"'$  (1.626 m), weight 111.5 kg, SpO2 100 %. ? ?PHYSICAL EXAMINATION:  ? ?GENERAL:  51 y.o.-year-old patient lying in the bed with no acute distress. Chronically ill ?LUNGS: Normal breath sounds bilaterally ?CARDIOVASCULAR: S1, S2 normal.  ?ABDOMEN: Soft, nontender, nondistended. Bowel sounds present.  ?EXTREMITIES:  ?Left stump ? ?Right Foot s/p BKA--dressing + ?NEUROLOGIC: nonfocal  patient is alert and awake ?SKIN: above ? ?LABORATORY PANEL:  ?CBC ?Recent Labs  ?Lab 11/15/21 ?6948 11/15/21 ?0900 11/16/21 ?0915  ?WBC 6.9  --   --   ?HGB 7.8* 6.8* 7.0*  ?HCT 26.6* 25.1*  --   ?PLT 278  --   --   ? ? ? ?Chemistries  ?Recent Labs  ?Lab 11/13/21 ?0608  ?NA 134*  ?K 4.8  ?CL 102  ?CO2 26  ?GLUCOSE 161*  ?BUN 51*  ?CREATININE 1.34*  ?CALCIUM 8.5*  ? ? ?Cardiac Enzymes ?No results for input(s): TROPONINI in the last 168 hours. ?RADIOLOGY:  ?No results found. ? ?Assessment and Plan ? ?[name] is a 51 year old Caucasian female with PMH CHF, CAD, DM 2, tobacco use, presenting with concern for wound infection on surgical site, she is status post BKA on 10/11/2021, currently being followed at home by wound care nurse.  Patient presented to the ED 11/04/2021 reports purulent drainage, warmth, pain at surgical site, on left stump, as well as wound on right heel ? ?Cellulitis of lower extremity ?Eschar Left BKA stump ?--Continue current IV antibiotics, ID is consulted, appreciate their recommendations as well ?--11/05/21 underwent Excisional debridement left below-knee amputation stump including skin subcutaneous tissues muscle and bone and application of a wound VAC w/ vascular surgery ?--pt currently on IV  Daptomycin + Ertapenem ? ?right heel gangrene/Diabetic foot ?PVD (peripheral vascular disease) ([place]) ? -- status post right lower extremity angiogram with stent placement in superficial femoral vein ?-- discussed with vascular surgery Dr. Delana Meyer will discussed with patient regarding right below knee amputation ?-- curb sided podiatry Dr. Sherryle Lis who took a look at the pictures and x-rays and recommended above ?--per Dr. Delana Meyer Right [place]  11/15/2021 ?--resume PT ? ?Uncontrolled type 2 diabetes mellitus with hyperglycemia, with long-term current use of insulin ([place]) ?--SSI/ glycemic protocol. ?-- cont semglee ?  ?Chronic diastolic CHF (congestive heart failure) ([place]) ?--Last echo earlier this month done by dr Nehemiah Massed. ?--Cont her on aspirin, rosuvastatin, Aldactone. ?   ?Tobacco abuse ?--Nicotine patch. ?  ?Chronic kidney disease, stage 3a ([place]) ?--Creatinine today about baseline ?  ?H/o Epilepsy--Pt is not on AED's. ? ?Anemia--acute blood loss ?--Likely due to surgical blood loss ?--Patient is status posttransfusion 2 units PRBC on this hospitalization ?--hgb 8.2 ?--hgb 6.8--s/p BT today ?  ?Body mass index is 42.19 kg/m?. ?Obesity--complicates prognosis and overall outcome ? ? ?Procedures: as above ?Family communication : husband in the hallway ?Consults : ID, vascular ?CODE STATUS: full ?DVT Prophylaxis : heparin ?Level of care: Med-Surg ?Status is: Inpatient ?Remains inpatient appropriate because: severe peripheral vascular disease with amputation stump infection on the left and s/p right  BKA  ?PT to see ?TOC d/c planning ?  ? ?TOTAL TIME TAKING CARE OF THIS PATIENT: 25 minutes.  ?>  50% time spent on counselling and coordination of care ? ?Note: This dictation was prepared with Dragon dictation along with smaller phrase technology. Any transcriptional errors that result from this process are unintentional. ? ?[name] M.D  ? ? ?[facility]  ? ?CC: ?Primary care physician; Pleas Koch, NP  ?

## 2021-11-16 NOTE — Progress Notes (Signed)
[facility] ? ?Daily Progress Note ? ? ?Subjective  -  ? ?Patient is status post right below-knee amputation.  Patient is having some pain but notes it is controlled with current medication regimen. ? ?Objective ?Vitals:  ? 11/16/21 0917 11/16/21 1214 11/16/21 1624 11/16/21 2031  ?BP: 119/60 116/71 130/66 (!) 118/55  ?Pulse: 63 65 73 72  ?Resp:    20  ?Temp: 97.9 ?F (36.6 ?C) 97.7 ?F (36.5 ?C) 98.1 ?F (36.7 ?C) 98.5 ?F (36.9 ?C)  ?TempSrc: Oral     ?SpO2: 96% 100% 96% 93%  ?Weight:      ?Height:      ? ? ?Intake/Output Summary (Last 24 hours) at 11/16/2021 2255 ?Last data filed at 11/16/2021 1900 ?Gross per 24 hour  ?Intake 1320 ml  ?Output 750 ml  ?Net 570 ml  ? ? ?PULM  CTAB ?CV  RRR ?VASC  left below-knee amputation with wound VAC and right below-knee amputation with postsurgical dressing ? ?Laboratory ?CBC ?   ?Component Value Date/Time  ? WBC 6.9 11/15/2021 0514  ? HGB 7.0 (L) 11/16/2021 0915  ? HGB 12.2 09/16/2021 0847  ? HCT 25.1 (L) 11/15/2021 0900  ? HCT 38.0 09/16/2021 0847  ? PLT 278 11/15/2021 0514  ? PLT 200 09/16/2021 0847  ? ? ?BMET ?   ?Component Value Date/Time  ? NA 134 (L) 11/13/2021 1761  ? NA 137 01/08/2018 1117  ? K 4.8 11/13/2021 0608  ? CL 102 11/13/2021 0608  ? CO2 26 11/13/2021 0608  ? GLUCOSE 161 (H) 11/13/2021 6073  ? BUN 51 (H) 11/13/2021 7106  ? BUN 31 (H) 01/08/2018 1117  ? CREATININE 1.34 (H) 11/13/2021 2694  ? CREATININE 1.59 (H) 07/18/2019 1506  ? CALCIUM 8.5 (L) 11/13/2021 8546  ? GFRNONAA 48 (L) 11/13/2021 2703  ? GFRAA 51 (L) 02/25/2020 1504  ? ? ?Assessment/Planning: ?POD #1 s/p right below-knee amputation ? ?Patient has pain in that area which is understandable.  Currently we will maintain with current regimen.  The patient does have a wound behind her thigh on the left.  This is concerning, especially given the open wound on her stump.  We will continue with monitoring however if the wound on the thigh area worsens or if the debrided stump.  Fails to  progress, we may need an above-knee amputation however it is currently too early to make any sort of determination for that.  We will plan on changing her wound care dressing on Thursday ? ? ?Kris Hartmann ? ?11/16/2021, 10:55 PM ? ? ? ?  ?

## 2021-11-17 DIAGNOSIS — L03115 Cellulitis of right lower limb: Secondary | ICD-10-CM | POA: Diagnosis not present

## 2021-11-17 DIAGNOSIS — M869 Osteomyelitis, unspecified: Secondary | ICD-10-CM | POA: Diagnosis not present

## 2021-11-17 DIAGNOSIS — T8740 Infection of amputation stump, unspecified extremity: Secondary | ICD-10-CM | POA: Diagnosis not present

## 2021-11-17 LAB — TYPE AND SCREEN
ABO/RH(D): O POS
Antibody Screen: NEGATIVE
Unit division: 0
Unit division: 0
Unit division: 0

## 2021-11-17 LAB — BPAM RBC
Blood Product Expiration Date: 202305082359
Blood Product Expiration Date: 202306072359
Blood Product Expiration Date: 202306072359
ISSUE DATE / TIME: 202305080918
ISSUE DATE / TIME: 202305080918
ISSUE DATE / TIME: 202305081245
Unit Type and Rh: 5100
Unit Type and Rh: 5100
Unit Type and Rh: 5100

## 2021-11-17 LAB — SURGICAL PATHOLOGY

## 2021-11-17 LAB — GLUCOSE, CAPILLARY
Glucose-Capillary: 242 mg/dL — ABNORMAL HIGH (ref 70–99)
Glucose-Capillary: 256 mg/dL — ABNORMAL HIGH (ref 70–99)
Glucose-Capillary: 247 mg/dL — ABNORMAL HIGH (ref 70–99)

## 2021-11-17 LAB — COMPREHENSIVE METABOLIC PANEL
ALT: 13 U/L (ref 0–44)
AST: 19 U/L (ref 15–41)
Albumin: 2.3 g/dL — ABNORMAL LOW (ref 3.5–5.0)
Alkaline Phosphatase: 109 U/L (ref 38–126)
Anion gap: 6 (ref 5–15)
BUN: 49 mg/dL — ABNORMAL HIGH (ref 6–20)
CO2: 23 mmol/L (ref 22–32)
Calcium: 8.2 mg/dL — ABNORMAL LOW (ref 8.9–10.3)
Chloride: 103 mmol/L (ref 98–111)
Creatinine, Ser: 1.12 mg/dL — ABNORMAL HIGH (ref 0.44–1.00)
GFR, Estimated: 59 mL/min — ABNORMAL LOW (ref >60)
Glucose, Bld: 268 mg/dL — ABNORMAL HIGH (ref 70–99)
Potassium: 4.7 mmol/L (ref 3.5–5.1)
Sodium: 132 mmol/L — ABNORMAL LOW (ref 135–145)
Total Bilirubin: 0.5 mg/dL (ref 0.3–1.2)
Total Protein: 7.6 g/dL (ref 6.5–8.1)

## 2021-11-17 MED ORDER — CIPROFLOXACIN HCL 500 MG PO TABS
500.0000 mg | ORAL_TABLET | Freq: Two times a day (BID) | ORAL | Status: DC
  Administered 2021-11-18 – 2021-11-20 (×5): 500 mg via ORAL
  Filled 2021-11-17 (×5): qty 1

## 2021-11-17 MED ORDER — INSULIN GLARGINE-YFGN 100 UNIT/ML ~~LOC~~ SOLN
35.0000 [IU] | Freq: Every day | SUBCUTANEOUS | Status: DC
  Administered 2021-11-18 – 2021-11-20 (×3): 35 [IU] via SUBCUTANEOUS
  Filled 2021-11-17 (×3): qty 0.35

## 2021-11-17 MED ORDER — CIPROFLOXACIN HCL 500 MG PO TABS: 500.0000 mg | ORAL_TABLET | Freq: Two times a day (BID) | ORAL | Status: DC

## 2021-11-17 NOTE — Anesthesia Postprocedure Evaluation (Signed)
Anesthesia Post Note ? ?Patient: [name] ? ?Procedure(s) Performed: BELOW KNEE AMPUTATION (Right: Leg Lower) ?APPLICATION OF WOUND VAC (Left: Leg Lower) ? ?Patient location during evaluation: PACU ?Anesthesia Type: General ?Level of consciousness: awake and alert ?Pain management: pain level controlled ?Vital Signs Assessment: post-procedure vital signs reviewed and stable ?Respiratory status: spontaneous breathing, nonlabored ventilation, respiratory function stable and patient connected to nasal cannula oxygen ?Cardiovascular status: blood pressure returned to baseline and stable ?Postop Assessment: no apparent nausea or vomiting ?Anesthetic complications: no ? ? ?No notable events documented. ? ? ?Last Vitals:  ?Vitals:  ? 11/16/21 2329 11/17/21 0509  ?BP: 126/63 121/61  ?Pulse: 72 70  ?Resp: 20 20  ?Temp: 37.1 ?C 36.6 ?C  ?SpO2: 94% 98%  ?  ?Last Pain:  ?Vitals:  ? 11/16/21 2045  ?TempSrc:   ?PainSc: Asleep  ? ? ?  ?  ?  ?  ?  ?  ? ?Martha Clan ? ? ? ? ?

## 2021-11-17 NOTE — Progress Notes (Signed)
ID ?Pt has no specific complaints ?Wants to go home ? ?O/E ?Awake and alert ?BP (!) 110/55   Pulse 72   Temp 98 ?F (36.7 ?C)   Resp 20   Ht '5\' 4"'$  (1.626 m)   Wt 108.7 kg   LMP  (LMP Unknown)   SpO2 100%   BMI 41.13 kg/m?   ?Chest bilateral air entry ?Heart sound S1-S2  ?abdomen soft ?Wound vac left stump ?Rt BKA- surgical dressing ?CNS non focal ? ? ? ? ? ?Labs ? ?  Latest Ref Rng & Units 11/16/2021  ?  9:15 AM 11/15/2021  ?  9:00 AM 11/15/2021  ?  5:14 AM  ?CBC  ?WBC 4.0 - 10.5 K/uL   6.9    ?Hemoglobin 12.0 - 15.0 g/dL 7.0   6.8   7.8    ?Hematocrit 36.0 - 46.0 %  25.1   26.6    ?Platelets 150 - 400 K/uL   278    ?  ? ?  Latest Ref Rng & Units 11/13/2021  ?  6:08 AM 11/11/2021  ?  7:43 AM 11/10/2021  ?  4:34 AM  ?CMP  ?Glucose 70 - 99 mg/dL 161   143   189    ?BUN 6 - 20 mg/dL 51   32   33    ?Creatinine 0.44 - 1.00 mg/dL 1.34   0.98   1.06    ?Sodium 135 - 145 mmol/L 134   134   132    ?Potassium 3.5 - 5.1 mmol/L 4.8   5.3   5.5    ?Chloride 98 - 111 mmol/L 102   103   101    ?CO2 22 - 32 mmol/L '26   27   26    '$ ?Calcium 8.9 - 10.3 mg/dL 8.5   8.5   8.6    ?  ? ?Micro ?Wound culture from left washout-enterobacter ?Rt heel wound culture- kleb, enterobacter and staph  ? ? ? ?Tongue-tie posterior calcaneus fracture with severe displacement and extension of the fracture line towards the posterior subtalar joint. ? ? ?Impression/recommendation ?Left BKA stump infection status post debridement and placement of wound VAC ?Cultures growing Enterobacter ?IV ertapenem  ? ?Right heel unstageable wound with eschar.  This is growing multiple organisms including Klebsiella Enterobacter and MRSA  ?On x-ray there is Tongue-type posterior calcaneus fracture with severe displacement and extension of the fracture line towards the posterior subtalar joint. underwent angio  and had angioplasty of the superficial femoral artery on the right side with stent placement. ?Underwent  BKA on 11/15/21 ?On  daptomycin which can be discontinued on  day of discharge ( today or tomorrow) ?On  ertapenem for gram neg coverage more for left BKA (( on discharge can be changed to PO Bactrim DS 1 bid or cipro '500mg'$  Po BID  for 2 weeks ?Remove PICC ? ?Recent MRSA bacteremia and MRSA wounds- was getting 6 weeks of Iv antibiotic and end date was 11/07/21 ? ? ?Diabetes mellitus on insulin ? ?Severe deconditioning ? ?Ca breast status post right mastectomy on anastrozole ? ?Severe anemia has received PRBC ? ?Anxiety/depression on BuSpar, citalopram, desvenlafaxine and trazodone ? ?Discussed with patient and care team ? ? ? ? ?

## 2021-11-17 NOTE — Progress Notes (Signed)
[place] [facility] ? ? ?PATIENT NAME: [name]   ? ?MR#:  [id number] ? ?DATE OF BIRTH:  [birth year] ? ?SUBJECTIVE:  ? ?denies any complaints.  ?S/p right BKA ?Pt insisting to go home ? ?VITALS:  ?Blood pressure (!) 114/55, pulse 71, temperature (!) 97.5 ?F (36.4 ?C), resp. rate 20, height '5\' 4"'$  (1.626 m), weight 108.7 kg, SpO2 98 %. ? ?PHYSICAL EXAMINATION:  ? ?GENERAL:  51 y.o.-year-old patient lying in the bed with no acute distress. Chronically ill ?LUNGS: Normal breath sounds bilaterally ?CARDIOVASCULAR: S1, S2 normal.  ?ABDOMEN: Soft, nontender, nondistended. Bowel sounds present.  ?EXTREMITIES:  ?Left stump ? ?Right Foot s/p BKA--dressing + ?NEUROLOGIC: nonfocal  patient is alert and awake ?SKIN: above ? ?LABORATORY PANEL:  ?CBC ?Recent Labs  ?Lab 11/15/21 ?6734 11/15/21 ?0900 11/16/21 ?0915  ?WBC 6.9  --   --   ?HGB 7.8* 6.8* 7.0*  ?HCT 26.6* 25.1*  --   ?PLT 278  --   --   ? ? ? ?Chemistries  ?Recent Labs  ?Lab 11/17/21 ?1122  ?NA 132*  ?K 4.7  ?CL 103  ?CO2 23  ?GLUCOSE 268*  ?BUN 49*  ?CREATININE 1.12*  ?CALCIUM 8.2*  ?AST 19  ?ALT 13  ?ALKPHOS 109  ?BILITOT 0.5  ? ? ?Cardiac Enzymes ?No results for input(s): TROPONINI in the last 168 hours. ?RADIOLOGY:  ?No results found. ? ?Assessment and Plan ? ?Ms. Simic is a 51 year old Caucasian female with PMH CHF, CAD, DM 2, tobacco use, presenting with concern for wound infection on surgical site, she is status post BKA on 10/11/2021, currently being followed at home by wound care nurse.  Patient presented to the ED 11/04/2021 reports purulent drainage, warmth, pain at surgical site, on left stump, as well as wound on right heel ? ?Cellulitis of lower extremity ?Eschar Left BKA stump ?--Continue current IV antibiotics, ID is consulted, appreciate their recommendations as well ?--11/05/21 underwent Excisional debridement left below-knee amputation stump including skin subcutaneous tissues muscle and bone and application of a wound  VAC w/ vascular surgery ?--pt currently on IV Daptomycin + Ertapenem--now changed to po cipro. Will d/c picc ? ?right heel gangrene/Diabetic foot ?PVD (peripheral vascular disease) ([place]) ? -- status post right lower extremity angiogram with stent placement in superficial femoral vein ?-- discussed with vascular surgery Dr. Delana Meyer will discussed with patient regarding right below knee amputation ?-- curb sided podiatry Dr. Sherryle Lis who took a look at the pictures and x-rays and recommended above ?--per Dr. Delana Meyer Right [place]  11/15/2021 ?--resume PT ?-dr schnier to change right BKA dressing on friday ? ?Uncontrolled type 2 diabetes mellitus with hyperglycemia, with long-term current use of insulin ([place]) ?--SSI/ glycemic protocol. ?-- cont semglee ?  ?Chronic diastolic CHF (congestive heart failure) ([place]) ?--Last echo earlier this month done by dr Nehemiah Massed. ?--Cont her on aspirin, rosuvastatin, Aldactone. ?   ?Tobacco abuse ?--Nicotine patch. ?  ?Chronic kidney disease, stage 3a ([place]) ?--Creatinine today about baseline ?  ?H/o Epilepsy--Pt is not on AED's. ? ?Anemia--acute blood loss ?--Likely due to surgical blood loss ?--Patient is status posttransfusion 2 units PRBC on this hospitalization ?--hgb 8.2 ?--hgb 6.8--s/p BT today--7.0 ?  ?Body mass index is 41.13 kg/m?. ?Obesity--complicates prognosis and overall outcome ? ? ?Procedures: as above ?Family communication : husband in the hallway ?Consults : ID, vascular ?CODE STATUS: full ?DVT Prophylaxis : heparin ?Level of care: Med-Surg ?Status is: Inpatient ?Remains inpatient appropriate because: severe peripheral vascular disease with  amputation stump infection on the left and s/p right  BKA  ?PT recommends rehab--pt and family refuses ?TOC d/c planning ?  ? ?TOTAL TIME TAKING CARE OF THIS PATIENT: 25 minutes.  ?>50% time spent on counselling and coordination of care ? ?Note: This dictation was prepared with Dragon dictation along with smaller phrase technology. Any  transcriptional errors that result from this process are unintentional. ? ?[name] M.D  ? ? ?[facility]  ? ?CC: ?Primary care physician; Pleas Koch, NP  ?

## 2021-11-17 NOTE — Progress Notes (Signed)
Inpatient Diabetes Program Recommendations ? ?AACE/ADA: New Consensus Statement on Inpatient Glycemic Control (2015) ? ?Target Ranges:  Prepandial:   less than 140 mg/dL ?     Peak postprandial:   less than 180 mg/dL (1-2 hours) ?     Critically ill patients:  140 - 180 mg/dL  ? ?Lab Results  ?Component Value Date  ? GLUCAP 256 (H) 11/17/2021  ? HGBA1C 10.1 (H) 09/21/2021  ? ? ?Review of Glycemic Control ? Latest Reference Range & Units 11/16/21 20:26 11/17/21 08:50 11/17/21 12:14  ?Glucose-Capillary 70 - 99 mg/dL 231 (H) 242 (H) 256 (H)  ? ?Diabetes history: DM 2 ?Outpatient Diabetes medications:  ?Empagliflozin-Metformin ER 25-1000 mg q AM ?Novolog 1-9 units 4 times a day ?Lantus 35 units daily ?Current orders for Inpatient glycemic control:  ?Novolog 0-15 units tid with meals ?Novolog 4 units tid with meals ?Semglee 28 units daily ? ?Inpatient Diabetes Program Recommendations:   ? ?Please consider increasing Semglee to 35 units daily.  ? ?Thanks,  ?Adah Perl, RN, BC-ADM ?Inpatient Diabetes Coordinator ?Pager [phone]  (8a-5p) ? ? ?

## 2021-11-17 NOTE — Evaluation (Signed)
Physical Therapy Evaluation ?Patient Details ?Name: [name] ?MRN: [id number] ?DOB: [birth year] ?Today's Date: 11/17/2021 ? ?History of Present Illness ? Pt is a 51 yo female with PMH that includes PVD, HTN, HLD, DMII, COPD, asthma, CVA, GERD, depression/anxiety, tobacco abuse, seizure d/o, anemia, CAD, CKD3a, dCHF, right breast cancer (s/p mastectomy) with recent prior admission in April, 2023 for left foot ulcer/infection and BLE lower leg pain.  Pt diagnosed during that admission with atherosclerotic occlusive disease bilateral lower extremities with gangrenous changes to the left heel. Pt had R BKA performed on 10/11/21.  Pt now admitted with cellulitis of her LLE extremity now s/p debridement with wound vac placement.  Pt also diagnosed with right foot gangrene with osteomyelitis of the heel and is s/p RLE BKA on 11/15/21.  MD assessment also includes uncontrolled DM and acute blood loss anemia. ?  ?Clinical Impression ? Pt was pleasant and motivated to participate during the session and put forth good effort throughout. Pt required near total assist with all bed mobility tasks and lateral scoot transfers.  Pt presented with posterior instability in sitting at the EOB that gradually improved with anterior weight shifting activities.  Pt reported no adverse symptoms other than RLE pain during the session.  Pt will benefit from PT services in a SNF setting upon discharge to safely address deficits listed in patient problem list for decreased caregiver assistance and eventual return to PLOF. ? ?   ?   ? ?Recommendations for follow up therapy are one component of a multi-disciplinary discharge planning process, led by the attending physician.  Recommendations may be updated based on patient status, additional functional criteria and insurance authorization. ? ?Follow Up Recommendations Skilled nursing-short term rehab (<3 hours/day) ? ?  ?Assistance Recommended at Discharge Frequent or constant Supervision/Assistance   ?Patient can return home with the following ? Two people to help with walking and/or transfers;A lot of help with bathing/dressing/bathroom;Direct supervision/assist for medications management;Assistance with cooking/housework;Direct supervision/assist for financial management;Assist for transportation;Help with stairs or ramp for entrance ? ?  ?Equipment Recommendations None recommended by PT  ?Recommendations for Other Services ?    ?  ?Functional Status Assessment Patient has had a recent decline in their functional status and/or demonstrates limited ability to make significant improvements in function in a reasonable and predictable amount of time  ? ?  ?Precautions / Restrictions Precautions ?Precautions: Fall ?Restrictions ?Weight Bearing Restrictions: Yes ?RLE Weight Bearing: Non weight bearing ?LLE Weight Bearing: Non weight bearing  ? ?  ? ?Mobility ? Bed Mobility ?Overal bed mobility: Needs Assistance ?Bed Mobility: Sit to Supine, Rolling, Supine to Sit ?Rolling: Mod assist ?  ?Supine to sit: Max assist ?Sit to supine: Max assist ?  ?General bed mobility comments: Max A for BLE and trunk control ?  ? ?Transfers ?  ?  ?  ?  ?  ?  ?  ?  ? Lateral/Scoot Transfers: Total assist ?General transfer comment: Pt required total assist during lateral scooting at the EOB ?  ? ?Ambulation/Gait ?  ?  ?  ?  ?  ?  ?  ?General Gait Details: unable (acute BKA) ? ?Stairs ?  ?  ?  ?  ?  ? ?Wheelchair Mobility ?  ? ?Modified Rankin (Stroke Patients Only) ?  ? ?  ? ?Balance Overall balance assessment: Needs assistance ?Sitting-balance support: Bilateral upper extremity supported ?Sitting balance-Leahy Scale: Poor ?Sitting balance - Comments: posterior lean requiring occasional min A to correct ?  ?  ?  ?  Standing balance comment: N/A ?  ?  ?  ?  ?  ?  ?  ?  ?  ?  ?  ?   ? ? ? ?Pertinent Vitals/Pain Pain Assessment ?Pain Assessment: 0-10 ?Pain Score: 3  ?Pain Location: RLE ?Pain Descriptors / Indicators: Aching, Sore ?Pain  Intervention(s): Repositioned, Premedicated before session, Monitored during session  ? ? ?Home Living Family/patient expects to be discharged to:: Private residence ?Living Arrangements: Spouse/significant other;Children ?Available Help at Discharge: Family;Available 24 hours/day ?Type of Home: House ?Home Access: Ramped entrance ?  ?  ?  ?Home Layout: One level ?Home Equipment: Rollator (4 wheels);Cane - single point;Wheelchair - manual ?Additional Comments: Pt owns mechanical lift, hospital bed, and sliding boards  ?  ?Prior Function Prior Level of Function : Needs assist ?  ?  ?  ?  ?  ?  ?Mobility Comments: Mod Ind amb community distances with a SPC prior to initial LLE BKA with 2 falls in the last 6 months secondary to LOB ?ADLs Comments: Min A with bathing and dressing, mostly sponge bathes ?  ? ? ?Hand Dominance  ? Dominant Hand: Right ? ?  ?Extremity/Trunk Assessment  ? Upper Extremity Assessment ?Upper Extremity Assessment: Generalized weakness ?  ? ?Lower Extremity Assessment ?Lower Extremity Assessment: Generalized weakness ?  ? ?   ?Communication  ? Communication: No difficulties  ?Cognition Arousal/Alertness: Awake/alert ?Behavior During Therapy: [facility] for tasks assessed/performed ?Overall Cognitive Status: Within Functional Limits for tasks assessed ?  ?  ?  ?  ?  ?  ?  ?  ?  ?  ?  ?  ?  ?  ?  ?  ?  ?  ?  ? ?  ?General Comments   ? ?  ?Exercises Total Joint Exercises ?Quad Sets: AROM, Strengthening, 10 reps, Both, 15 reps ?Long Arc Quad: AROM, Strengthening, 10 reps, Both, 15 reps ?Knee Flexion: AROM, Strengthening, Both, 10 reps, 15 reps ?Other Exercises ?Other Exercises: Pt and family education provided on positioning education to promoted BLE knee ext PROM ?Other Exercises: HEP education for BLE QS, GS, and LAQs ?Other Exercises: Verbal education provided on strategies for max to total assist transfers bed to/from w/c with pt and family  ? ?Assessment/Plan  ?  ?PT Assessment Patient needs continued  PT services  ?PT Problem List Decreased strength;Decreased range of motion;Decreased activity tolerance;Decreased balance;Decreased mobility;Decreased knowledge of use of DME;Pain ? ?   ?  ?PT Treatment Interventions DME instruction;Functional mobility training;Therapeutic activities;Therapeutic exercise;Balance training;Patient/family education   ? ?PT Goals (Current goals can be found in the Care Plan section)  ?Acute Rehab PT Goals ?Patient Stated Goal: To go to the bathroom by myself ?PT Goal Formulation: With patient/family ?Time For Goal Achievement: 11/30/21 ?Potential to Achieve Goals: Fair ? ?  ?Frequency 7X/week ?  ? ? ?Co-evaluation   ?  ?  ?  ?  ? ? ?  ?AM-PAC PT "6 Clicks" Mobility  ?Outcome Measure Help needed turning from your back to your side while in a flat bed without using bedrails?: Total ?Help needed moving from lying on your back to sitting on the side of a flat bed without using bedrails?: Total ?Help needed moving to and from a bed to a chair (including a wheelchair)?: Total ?Help needed standing up from a chair using your arms (e.g., wheelchair or bedside chair)?: Total ?Help needed to walk in hospital room?: Total ?Help needed climbing 3-5 steps with a railing? : Total ?6 Click Score:  6 ? ?  ?End of Session   ?Activity Tolerance: Patient tolerated treatment well ?Patient left: in bed;with call bell/phone within reach;with bed alarm set;with family/visitor present ?Nurse Communication: Mobility status ?PT Visit Diagnosis: Muscle weakness (generalized) (M62.81);History of falling (Z91.81);Pain ?Pain - Right/Left: Right ?Pain - part of body: Leg ?  ? ?Time: 0131-4388 ?PT Time Calculation (min) (ACUTE ONLY): 39 min ? ? ?Charges:   PT Evaluation ?$PT Eval Moderate Complexity: 1 Mod ?PT Treatments ?$Therapeutic Exercise: 8-22 mins ?$Therapeutic Activity: 8-22 mins ?  ?   ? ?D. Royetta Asal PT, DPT ?11/17/21, 11:23 AM ? ? ? ?

## 2021-11-17 NOTE — Plan of Care (Signed)
  Problem: Education: Goal: Knowledge of General Education information will improve Description: Including pain rating scale, medication(s)/side effects and non-pharmacologic comfort measures Outcome: Progressing   Problem: Pain Managment: Goal: General experience of comfort will improve Outcome: Progressing   

## 2021-11-17 NOTE — Plan of Care (Signed)
?  Problem: Education: ?Goal: Knowledge of General Education information will improve ?Description: Including pain rating scale, medication(s)/side effects and non-pharmacologic comfort measures ?Outcome: Progressing ?  ?Problem: Health Behavior/Discharge Planning: ?Goal: Ability to manage health-related needs will improve ?Outcome: Progressing ?  ?Problem: Clinical Measurements: ?Goal: Ability to maintain clinical measurements within normal limits will improve ?Outcome: Progressing ?Goal: Will remain free from infection ?Outcome: Progressing ?Goal: Diagnostic test results will improve ?Outcome: Progressing ?Goal: Respiratory complications will improve ?Outcome: Progressing ?Goal: Cardiovascular complication will be avoided ?Outcome: Progressing ?  ?Problem: Activity: ?Goal: Risk for activity intolerance will decrease ?Outcome: Progressing ?  ?Problem: Nutrition: ?Goal: Adequate nutrition will be maintained ?Outcome: Progressing ?  ?Problem: Coping: ?Goal: Level of anxiety will decrease ?Outcome: Progressing ?  ?Problem: Elimination: ?Goal: Will not experience complications related to bowel motility ?Outcome: Progressing ?Goal: Will not experience complications related to urinary retention ?Outcome: Progressing ?  ?Problem: Pain Managment: ?Goal: General experience of comfort will improve ?Outcome: Progressing ?  ?Problem: Safety: ?Goal: Ability to remain free from injury will improve ?Outcome: Progressing ?  ?Problem: Skin Integrity: ?Goal: Risk for impaired skin integrity will decrease ?Outcome: Progressing ?  ?Problem: Education: ?Goal: Ability to describe self-care measures that may prevent or decrease complications (Diabetes Survival Skills Education) will improve ?Outcome: Progressing ?Goal: Individualized Educational Video(s) ?Outcome: Progressing ?  ?Problem: Coping: ?Goal: Ability to adjust to condition or change in health will improve ?Outcome: Progressing ?  ?Problem: Fluid Volume: ?Goal: Ability to  maintain a balanced intake and output will improve ?Outcome: Progressing ?  ?Problem: Health Behavior/Discharge Planning: ?Goal: Ability to identify and utilize available resources and services will improve ?Outcome: Progressing ?Goal: Ability to manage health-related needs will improve ?Outcome: Progressing ?  ?Problem: Metabolic: ?Goal: Ability to maintain appropriate glucose levels will improve ?Outcome: Progressing ?  ?Problem: Nutritional: ?Goal: Maintenance of adequate nutrition will improve ?Outcome: Progressing ?Goal: Progress toward achieving an optimal weight will improve ?Outcome: Progressing ?  ?Problem: Skin Integrity: ?Goal: Risk for impaired skin integrity will decrease ?Outcome: Progressing ?  ?Problem: Tissue Perfusion: ?Goal: Adequacy of tissue perfusion will improve ?Outcome: Progressing ?  ?Problem: Education: ?Goal: Ability to describe self-care measures that may prevent or decrease complications (Diabetes Survival Skills Education) will improve ?Outcome: Progressing ?Goal: Individualized Educational Video(s) ?Outcome: Progressing ?  ?Problem: Coping: ?Goal: Ability to adjust to condition or change in health will improve ?Outcome: Progressing ?  ?Problem: Fluid Volume: ?Goal: Ability to maintain a balanced intake and output will improve ?Outcome: Progressing ?  ?Problem: Health Behavior/Discharge Planning: ?Goal: Ability to identify and utilize available resources and services will improve ?Outcome: Progressing ?Goal: Ability to manage health-related needs will improve ?Outcome: Progressing ?  ?Problem: Nutritional: ?Goal: Maintenance of adequate nutrition will improve ?Outcome: Progressing ?Goal: Progress toward achieving an optimal weight will improve ?Outcome: Progressing ?  ?Problem: Skin Integrity: ?Goal: Risk for impaired skin integrity will decrease ?Outcome: Progressing ?  ?Problem: Tissue Perfusion: ?Goal: Adequacy of tissue perfusion will improve ?Outcome: Progressing ?  ?Problem:  Clinical Measurements: ?Goal: Ability to avoid or minimize complications of infection will improve ?Outcome: Progressing ?  ?Problem: Skin Integrity: ?Goal: Skin integrity will improve ?Outcome: Progressing ?  ?

## 2021-11-17 NOTE — TOC Progression Note (Addendum)
Transition of Care (TOC) - Progression Note  ? ? ?Patient Details  ?Name: [name] ?MRN: [id number] ?Date of Birth: 1971/01/15 ? ?Transition of Care (TOC) CM/SW Contact  ?Conception Oms, RN ?Phone Number: ?11/17/2021, 10:51 AM ? ?Clinical Narrative:    ? ?Called husband Kennith Center at [phone], Unable to reach, VM not set up, will call again, the patient was set up at a previous visit with [place], I called [place] [facility] at [phone] and left a secure VM requesting a call back from Apolonio Schneiders, Will confirm if [facility] can manage the wound Vac, Awaiting a call back ? ?Expected Discharge Plan: [place] ?  ? ?Expected Discharge Plan and Services ?Expected Discharge Plan: [place] ?In-house Referral: NA ?Discharge Planning Services: CM Consult ?  ?Living arrangements for the past 2 months: [place] ?                ?  ?DME Agency: NA ?  ?  ?  ?  ?  ?  ?  ?  ? ? ?Social Determinants of Health (SDOH) Interventions ?  ? ?Readmission Risk Interventions ?   ? View : No data to display.  ?  ?  ?  ? ? ?

## 2021-11-18 ENCOUNTER — Encounter: Payer: Self-pay | Admitting: Internal Medicine

## 2021-11-18 DIAGNOSIS — L03115 Cellulitis of right lower limb: Secondary | ICD-10-CM | POA: Diagnosis not present

## 2021-11-18 LAB — GLUCOSE, CAPILLARY
Glucose-Capillary: 266 mg/dL — ABNORMAL HIGH (ref 70–99)
Glucose-Capillary: 267 mg/dL — ABNORMAL HIGH (ref 70–99)
Glucose-Capillary: 238 mg/dL — ABNORMAL HIGH (ref 70–99)

## 2021-11-18 NOTE — TOC Progression Note (Signed)
Transition of Care (TOC) - Progression Note  ? ? ?Patient Details  ?Name: [name] ?MRN: [id number] ?Date of Birth: [birth year] ? ?Transition of Care (TOC) CM/SW Contact  ?Conception Oms, RN ?Phone Number: ?11/18/2021, 2:00 PM ? ?Clinical Narrative:    ? ?Secure emailed the Wound Vac order to Tommi Emery with [employer] [facility], They will deliver the wound vac to the hospital ? ?Expected Discharge Plan: [place] ?Barriers to Discharge: Continued Medical Work up ? ?Expected Discharge Plan and Services ?Expected Discharge Plan: [place] ?In-house Referral: NA ?Discharge Planning Services: CM Consult ?Post Acute Care Choice: Home Health ?Living arrangements for the past 2 months: [place] ?                ?  ?DME Agency: NA ?  ?  ?  ?HH Arranged: PT, RN, OT ?[place] Agency:  ([place]) ?Date HH Agency Contacted: 11/18/21 ?Time [place]: [phone] ?Representative spoke with at [place]: Apolonio Schneiders ? ? ?Social Determinants of Health (SDOH) Interventions ?  ? ?Readmission Risk Interventions ?   ? View : No data to display.  ?  ?  ?  ? ? ?

## 2021-11-18 NOTE — Progress Notes (Signed)
[place] [facility] ? ? ?PATIENT NAME: [name]   ? ?MR#:  [id number] ? ?DATE OF BIRTH:  [birth year] ? ?SUBJECTIVE:  ? ?denies any complaints.  ?S/p right BKA ?Pt insisting to go home ? ?VITALS:  ?Blood pressure (!) 129/54, pulse 74, temperature 98.2 ?F (36.8 ?C), resp. rate 16, height '5\' 4"'$  (1.626 m), weight 108.7 kg, SpO2 97 %. ? ?PHYSICAL EXAMINATION:  ? ?GENERAL:  51 y.o.-year-old patient lying in the bed with no acute distress. Chronically ill ?LUNGS: Normal breath sounds bilaterally ?CARDIOVASCULAR: S1, S2 normal.  ?ABDOMEN: Soft, nontender, nondistended. Bowel sounds present.  ?EXTREMITIES:  ?Left stump ? ?Right Foot s/p BKA--dressing + ? ?NEUROLOGIC: nonfocal  patient is alert and awake ?SKIN: above ? ?LABORATORY PANEL:  ?CBC ?Recent Labs  ?Lab 11/15/21 ?7564 11/15/21 ?0900 11/16/21 ?0915  ?WBC 6.9  --   --   ?HGB 7.8* 6.8* 7.0*  ?HCT 26.6* 25.1*  --   ?PLT 278  --   --   ? ? ? ?Chemistries  ?Recent Labs  ?Lab 11/17/21 ?1122  ?NA 132*  ?K 4.7  ?CL 103  ?CO2 23  ?GLUCOSE 268*  ?BUN 49*  ?CREATININE 1.12*  ?CALCIUM 8.2*  ?AST 19  ?ALT 13  ?ALKPHOS 109  ?BILITOT 0.5  ? ? ?Cardiac Enzymes ?No results for input(s): TROPONINI in the last 168 hours. ?RADIOLOGY:  ?No results found. ? ?Assessment and Plan ? ?[name] is a 51 year old Caucasian female with PMH CHF, CAD, DM 2, tobacco use, presenting with concern for wound infection on surgical site, she is status post BKA on 10/11/2021, currently being followed at home by wound care nurse.  Patient presented to the ED 11/04/2021 reports purulent drainage, warmth, pain at surgical site, on left stump, as well as wound on right heel ? ?Cellulitis of lower extremity ?Eschar Left BKA stump ?--Continue current IV antibiotics, ID is consulted, appreciate their recommendations as well ?--11/05/21 underwent Excisional debridement left below-knee amputation stump including skin subcutaneous tissues muscle and bone and application of a wound  VAC w/ vascular surgery ?--pt currently on IV Daptomycin + Ertapenem--now changed to po cipro for 2 weeks  ?--Will d/c picc ? ?right heel gangrene/Diabetic foot ?PVD (peripheral vascular disease) ([place]) ? -- status post right lower extremity angiogram with stent placement in superficial femoral vein ?-- discussed with vascular surgery Dr. Delana Meyer will discussed with patient regarding right below knee amputation ?-- curb sided podiatry Dr. Sherryle Lis who took a look at the pictures and x-rays and recommended above ?--per Dr. Delana Meyer Right [place]  11/15/2021 ?--resume PT ?-dr schnier to change right BKA dressing on Friday 11/19/2021 ? ?Uncontrolled type 2 diabetes mellitus with hyperglycemia, with long-term current use of insulin ([place]) ?--SSI/ glycemic protocol. ?-- cont semglee ?  ?Chronic diastolic CHF (congestive heart failure) ([place]) ?--Last echo earlier this month done by dr Nehemiah Massed. ?--Cont her on aspirin, rosuvastatin, Aldactone. ?   ?Tobacco abuse ?--Nicotine patch. ?  ?Chronic kidney disease, stage 3a ([place]) ?--Creatinine today about baseline ?  ?H/o Epilepsy--Pt is not on AED's. ? ?Anemia--acute blood loss ?--Likely due to surgical blood loss ?--Patient is status posttransfusion 2 units PRBC on this hospitalization ?--hgb 8.2 ?--hgb 6.8--s/p BT --7.0 ?  ?Body mass index is 41.13 kg/m?. ?Obesity--complicates prognosis and overall outcome ? ? ?Procedures: as above ?Family communication : husband  ?Consults : ID, vascular ?CODE STATUS: full ?DVT Prophylaxis : heparin ?Level of care: Med-Surg ?Status is: Inpatient ?Remains inpatient appropriate because: severe peripheral  vascular disease with amputation stump infection on the left and s/p right  BKA  ?patient will discharge likely Friday evening or Saturday morning with wound VAC on left amputation stump ? ?TOTAL TIME TAKING CARE OF THIS PATIENT: 25 minutes.  ?>50% time spent on counselling and coordination of care ? ?Note: This dictation was prepared with Dragon  dictation along with smaller phrase technology. Any transcriptional errors that result from this process are unintentional. ? ?[name] M.D  ? ? ?[facility]  ? ?CC: ?Primary care physician; Pleas Koch, NP  ?

## 2021-11-18 NOTE — TOC Progression Note (Signed)
Transition of Care (TOC) - Progression Note  ? ? ?Patient Details  ?Name: Laquandra Soh ?MRN: [id number] ?Date of Birth: September 06, 1970 ? ?Transition of Care (TOC) CM/SW Contact  ?Conception Oms, RN ?Phone Number: ?11/18/2021, 9:09 AM ? ?Clinical Narrative:    ?[facility] agreed to accept the patient for the PT, OT and RN for wound vac care ? ? ?Expected Discharge Plan: [place] ?  ? ?Expected Discharge Plan and Services ?Expected Discharge Plan: [place] ?In-house Referral: NA ?Discharge Planning Services: CM Consult ?  ?Living arrangements for the past 2 months: [place] ?                ?  ?DME Agency: NA ?  ?  ?  ?  ?  ?  ?  ?  ? ? ?Social Determinants of Health (SDOH) Interventions ?  ? ?Readmission Risk Interventions ?   ? View : No data to display.  ?  ?  ?  ? ? ?

## 2021-11-18 NOTE — Progress Notes (Signed)
Physical Therapy Treatment ?Patient Details ?Name: [name] ?MRN: [id number] ?DOB: 05-Apr-1971 ?Today's Date: 11/18/2021 ? ? ?History of Present Illness Pt is a 51 yo female with PMH that includes PVD, HTN, HLD, DMII, COPD, asthma, CVA, GERD, depression/anxiety, tobacco abuse, seizure d/o, anemia, CAD, CKD3a, dCHF, right breast cancer (s/p mastectomy) with recent prior admission in April, 2023 for left foot ulcer/infection and BLE lower leg pain.  Pt diagnosed during that admission with atherosclerotic occlusive disease bilateral lower extremities with gangrenous changes to the left heel. Pt had R BKA performed on 10/11/21.  Pt now admitted with cellulitis of her LLE extremity now s/p debridement with wound vac placement.  Pt also diagnosed with right foot gangrene with osteomyelitis of the heel and is s/p RLE BKA on 11/15/21.  MD assessment also includes uncontrolled DM and acute blood loss anemia. ? ?  ?PT Comments  ? ? Pt was long sitting in bed upon arriving. She agrees to session and is cooperative throughout. " I really want to get OOB to the recliner." Author spent a lot of tim educating pt/pt's spouse on positioning, there ex, and what to expect going forward. Both pt/pt's spouse are not willing to go to rehab and plan to DC home when cleared medically. Pt was able to progress to/from EOB short sit with max assist + max vcs. She sat EOB with assistance x ~ 10 minutes prior to returning to bed. Lateral transfer to recliner required total assist. Pt does attempt to assist but is very fatigue with minimal activity. PT continues to recommend SNF even though pt is planning to return home at DC. If pt does decide to DC home, recommend HHPT.  ?  ?Recommendations for follow up therapy are one component of a multi-disciplinary discharge planning process, led by the attending physician.  Recommendations may be updated based on patient status, additional functional criteria and insurance authorization. ? ?Follow Up  Recommendations ? Skilled nursing-short term rehab (<3 hours/day) (Pt and spouse refusing rehab and feel safe to DC home. Per pt's spouse, " we have all the equipment we need.") ?  ?  ?Assistance Recommended at Discharge Frequent or constant Supervision/Assistance  ?Patient can return home with the following Two people to help with walking and/or transfers;A lot of help with bathing/dressing/bathroom;Direct supervision/assist for medications management;Assistance with cooking/housework;Direct supervision/assist for financial management;Assist for transportation;Help with stairs or ramp for entrance ?  ?Equipment Recommendations ? None recommended by PT  ?  ?   ?Precautions / Restrictions Precautions ?Precautions: Fall ?Restrictions ?Weight Bearing Restrictions: Yes ?RLE Weight Bearing: Non weight bearing ?LLE Weight Bearing: Non weight bearing  ?  ? ?Mobility ? Bed Mobility ?Overal bed mobility: Needs Assistance ?Bed Mobility: Sit to Supine, Rolling, Supine to Sit ?Rolling: Mod assist ?  ?Supine to sit: Max assist ?Sit to supine: Max assist ?  ?  ?  ? ?Transfers ?Overall transfer level: Needs assistance ?Equipment used: None ?Transfers: Bed to chair/wheelchair/BSC ?  ?  ?  ?  ?  ? Lateral/Scoot Transfers: Total assist ?General transfer comment: total assist required to progress from bed to recliner. Author will return to assist pt later this date with returngin to bed from recliner ?  ? ?Ambulation/Gait ?   ?General Gait Details: unable ? ? ?  ?Balance Overall balance assessment: Needs assistance ?Sitting-balance support: Bilateral upper extremity supported ?Sitting balance-Leahy Scale: Poor ?  ?  ?Standing balance support: Bilateral upper extremity supported, During functional activity, Reliant on assistive device for balance ?Standing balance-Leahy  Scale: Zero ?Standing balance comment: unable to stand ?  ?   ?Cognition Arousal/Alertness: Awake/alert ?Behavior During Therapy: [facility] for tasks  assessed/performed ?Overall Cognitive Status: Within Functional Limits for tasks assessed ?  ?  ?   ?  ?General Comments: Pt is A and O x 4 ?  ?  ? ?  ?   ?General Comments General comments (skin integrity, edema, etc.): Alot of time educating pt with positioning, ther ex to promote UE and LE strength. per spouse," we have all the equipment we need." ?  ?  ? ?Pertinent Vitals/Pain Pain Assessment ?Pain Assessment: 0-10 ?Pain Score: 4  ?Pain Location: RLE ?Pain Descriptors / Indicators: Aching, Sore ?Pain Intervention(s): Limited activity within patient's tolerance, Monitored during session, Premedicated before session, Repositioned  ? ? ? ?PT Goals (current goals can now be found in the care plan section) Acute Rehab PT Goals ?Patient Stated Goal: home ?Progress towards PT goals: Progressing toward goals ? ?  ?Frequency ? ? ? 7X/week ? ? ? ?  ?PT Plan Current plan remains appropriate  ? ? ?   ?AM-PAC PT "6 Clicks" Mobility   ?Outcome Measure ? Help needed turning from your back to your side while in a flat bed without using bedrails?: A Lot ?Help needed moving from lying on your back to sitting on the side of a flat bed without using bedrails?: A Lot ?Help needed moving to and from a bed to a chair (including a wheelchair)?: Total ?Help needed standing up from a chair using your arms (e.g., wheelchair or bedside chair)?: Total ?Help needed to walk in hospital room?: Total ?Help needed climbing 3-5 steps with a railing? : Total ?6 Click Score: 8 ? ?  ?End of Session   ?Activity Tolerance: Patient tolerated treatment well ?Patient left: in chair;with call bell/phone within reach;with chair alarm set;with family/visitor present ?Nurse Communication: Mobility status ?PT Visit Diagnosis: Muscle weakness (generalized) (M62.81);History of falling (Z91.81);Pain ?Pain - Right/Left: Right ?Pain - part of body: Leg ?  ? ? ?Time: 0932-3557 ?PT Time Calculation (min) (ACUTE ONLY): 33 min ? ?Charges:  $Therapeutic Activity: 23-37  mins          ?          ? ?Julaine Fusi PTA ?11/18/21, 11:50 AM  ? ?

## 2021-11-19 ENCOUNTER — Encounter: Payer: Self-pay | Admitting: Vascular Surgery

## 2021-11-19 DIAGNOSIS — T8740 Infection of amputation stump, unspecified extremity: Secondary | ICD-10-CM | POA: Diagnosis not present

## 2021-11-19 DIAGNOSIS — L089 Local infection of the skin and subcutaneous tissue, unspecified: Secondary | ICD-10-CM | POA: Diagnosis not present

## 2021-11-19 DIAGNOSIS — I739 Peripheral vascular disease, unspecified: Secondary | ICD-10-CM | POA: Diagnosis not present

## 2021-11-19 DIAGNOSIS — I5032 Chronic diastolic (congestive) heart failure: Secondary | ICD-10-CM

## 2021-11-19 DIAGNOSIS — Z89511 Acquired absence of right leg below knee: Secondary | ICD-10-CM

## 2021-11-19 DIAGNOSIS — Z4889 Encounter for other specified surgical aftercare: Secondary | ICD-10-CM

## 2021-11-19 DIAGNOSIS — L03116 Cellulitis of left lower limb: Secondary | ICD-10-CM | POA: Diagnosis not present

## 2021-11-19 DIAGNOSIS — Z89512 Acquired absence of left leg below knee: Secondary | ICD-10-CM

## 2021-11-19 LAB — GLUCOSE, CAPILLARY
Glucose-Capillary: 236 mg/dL — ABNORMAL HIGH (ref 70–99)
Glucose-Capillary: 268 mg/dL — ABNORMAL HIGH (ref 70–99)
Glucose-Capillary: 237 mg/dL — ABNORMAL HIGH (ref 70–99)
Glucose-Capillary: 256 mg/dL — ABNORMAL HIGH (ref 70–99)

## 2021-11-19 LAB — CBC
HCT: 22.6 % — ABNORMAL LOW (ref 36.0–46.0)
Hemoglobin: 6.7 g/dL — ABNORMAL LOW (ref 12.0–15.0)
MCH: 25.4 pg — ABNORMAL LOW (ref 26.0–34.0)
MCHC: 29.6 g/dL — ABNORMAL LOW (ref 30.0–36.0)
MCV: 85.6 fL (ref 80.0–100.0)
Platelets: 245 10*3/uL (ref 150–400)
RBC: 2.64 MIL/uL — ABNORMAL LOW (ref 3.87–5.11)
RDW: 18.6 % — ABNORMAL HIGH (ref 11.5–15.5)
WBC: 8.7 10*3/uL (ref 4.0–10.5)
nRBC: 0 % (ref 0.0–0.2)

## 2021-11-19 LAB — PREPARE RBC (CROSSMATCH)

## 2021-11-19 MED ORDER — SODIUM CHLORIDE 0.9% IV SOLUTION: Freq: Once | INTRAVENOUS | Status: AC

## 2021-11-19 MED ORDER — MUPIROCIN CALCIUM 2 % EX CREA
TOPICAL_CREAM | Freq: Two times a day (BID) | CUTANEOUS | Status: DC
  Filled 2021-11-19: qty 15

## 2021-11-19 MED ORDER — HYDROMORPHONE HCL 1 MG/ML IJ SOLN
1.0000 mg | INTRAMUSCULAR | Status: AC
  Administered 2021-11-19: 1 mg via INTRAVENOUS
  Filled 2021-11-19: qty 1

## 2021-11-19 NOTE — Plan of Care (Signed)
Patient is progressing but she still gets confused every now and then. ?

## 2021-11-19 NOTE — Progress Notes (Signed)
ID ?Doing much better ?Says she is ready to go home ?No fever or chills ?Appetite good ? ?O/E ?Awake and alert ?BP 129/65 (BP Location: Left Arm)   Pulse 76   Temp 98.3 ?F (36.8 ?C)   Resp 16   Ht '5\' 4"'$  (1.626 m)   Wt 108.7 kg   LMP  (LMP Unknown)   SpO2 100%   BMI 41.13 kg/m?   ?Chest bilateral air entry ?Heart sound S1-S2  ?abdomen soft ?B/l BKA ?Left stump ?Health granulation tissue ? ? ?Rt BKA- ?Surgical site looks well approximated ? ? ? ? ? ?CNS non focal ? ? ? ? ? ?Labs ? ?  Latest Ref Rng & Units 11/19/2021  ?  5:17 AM 11/16/2021  ?  9:15 AM 11/15/2021  ?  9:00 AM  ?CBC  ?WBC 4.0 - 10.5 K/uL 8.7      ?Hemoglobin 12.0 - 15.0 g/dL 6.7   7.0   6.8    ?Hematocrit 36.0 - 46.0 % 22.6    25.1    ?Platelets 150 - 400 K/uL 245      ?  ? ?  Latest Ref Rng & Units 11/17/2021  ? 11:22 AM 11/13/2021  ?  6:08 AM 11/11/2021  ?  7:43 AM  ?CMP  ?Glucose 70 - 99 mg/dL 268   161   143    ?BUN 6 - 20 mg/dL 49   51   32    ?Creatinine 0.44 - 1.00 mg/dL 1.12   1.34   0.98    ?Sodium 135 - 145 mmol/L 132   134   134    ?Potassium 3.5 - 5.1 mmol/L 4.7   4.8   5.3    ?Chloride 98 - 111 mmol/L 103   102   103    ?CO2 22 - 32 mmol/L '23   26   27    '$ ?Calcium 8.9 - 10.3 mg/dL 8.2   8.5   8.5    ?Total Protein 6.5 - 8.1 g/dL 7.6      ?Total Bilirubin 0.3 - 1.2 mg/dL 0.5      ?Alkaline Phos 38 - 126 U/L 109      ?AST 15 - 41 U/L 19      ?ALT 0 - 44 U/L 13      ?  ? ?Micro ?Wound culture from left washout-enterobacter ?Rt heel wound culture- kleb, enterobacter and staph  ? ? ? ?Tongue-tie posterior calcaneus fracture with severe displacement and extension of the fracture line towards the posterior subtalar joint. ? ? ?Impression/recommendation ?Left BKA stump infection status post debridement and placement of wound VAC ?Cultures growing Enterobacter ?IV ertapenem  ? ?Right heel unstageable wound with eschar.  This is growing multiple organisms including Klebsiella Enterobacter and MRSA  ?On x-ray there is Tongue-type posterior calcaneus  fracture with severe displacement and extension of the fracture line towards the posterior subtalar joint. underwent angio  and had angioplasty of the superficial femoral artery on the right side with stent placement. ?Underwent  BKA on 11/15/21 ?Completed  daptomycin  ?Was On  ertapenem for treating enterobacter  left BKA which is now changed to cipro until 11/28/21 ? ?PICC removed ? ?Recent MRSA bacteremia and MRSA wounds- was getting 6 weeks of Iv antibiotic and end date was 11/07/21 ?Mupirocin for scabs ? ? ?Diabetes mellitus on insulin ? ?Severe deconditioning ? ?Ca breast status post right mastectomy on anastrozole ? ?Severe anemia has received PRBC ? ?Anxiety/depression on BuSpar, citalopram,  desvenlafaxine and trazodone ? ?Discussed with patient and vascualr team- pt seen with vascular team ?ID will sign off- call if needed ? ? ? ? ?

## 2021-11-19 NOTE — Progress Notes (Signed)
Physical Therapy Treatment ?Patient Details ?Name: [name] ?MRN: [id number] ?DOB: [birth year] ?Today's Date: 11/19/2021 ? ? ?History of Present Illness Pt is a 51 yo female with PMH that includes PVD, HTN, HLD, DMII, COPD, asthma, CVA, GERD, depression/anxiety, tobacco abuse, seizure d/o, anemia, CAD, CKD3a, dCHF, right breast cancer (s/p mastectomy) with recent prior admission in April, 2023 for left foot ulcer/infection and BLE lower leg pain.  Pt diagnosed during that admission with atherosclerotic occlusive disease bilateral lower extremities with gangrenous changes to the left heel. Pt had R BKA performed on 10/11/21.  Pt now admitted with cellulitis of her LLE extremity now s/p debridement with wound vac placement.  Pt also diagnosed with right foot gangrene with osteomyelitis of the heel and is s/p RLE BKA on 11/15/21.  MD assessment also includes uncontrolled DM and acute blood loss anemia. ? ?  ?PT Comments  ? ? Pt's Hgb 6.7.  MD contacted and stated ok for bed therex.  Pt somewhat lethargic during the session but put forth good effort with below therex.  Mobility training deferred secondary to low Hgb and pt lethargy.  Pt will benefit from PT services in a SNF setting upon discharge to safely address deficits listed in patient problem list for decreased caregiver assistance and eventual return to PLOF. ?   ?Recommendations for follow up therapy are one component of a multi-disciplinary discharge planning process, led by the attending physician.  Recommendations may be updated based on patient status, additional functional criteria and insurance authorization. ? ?Follow Up Recommendations ? Skilled nursing-short term rehab (<3 hours/day) ?  ?  ?Assistance Recommended at Discharge Frequent or constant Supervision/Assistance  ?Patient can return home with the following Two people to help with walking and/or transfers;A lot of help with bathing/dressing/bathroom;Direct supervision/assist for medications  management;Assistance with cooking/housework;Direct supervision/assist for financial management;Assist for transportation;Help with stairs or ramp for entrance ?  ?Equipment Recommendations ? None recommended by PT  ?  ?Recommendations for Other Services   ? ? ?  ?Precautions / Restrictions Precautions ?Precautions: Fall ?Restrictions ?Weight Bearing Restrictions: Yes ?RLE Weight Bearing: Non weight bearing ?LLE Weight Bearing: Non weight bearing  ?  ? ?Mobility ? Bed Mobility ?  ?  ?  ?  ?  ?  ?  ?General bed mobility comments: Pt lethargic with Hgb 6.7, ok per MD for bed therex, mobility deferred ?  ? ?Transfers ?  ?  ?  ?  ?  ?  ?  ?  ?  ?  ?  ? ?Ambulation/Gait ?  ?  ?  ?  ?  ?  ?  ?  ? ? ?Stairs ?  ?  ?  ?  ?  ? ? ?Wheelchair Mobility ?  ? ?Modified Rankin (Stroke Patients Only) ?  ? ? ?  ?Balance   ?  ?  ?  ?  ?  ?  ?  ?  ?  ?  ?  ?  ?  ?  ?  ?  ?  ?  ?  ? ?  ?Cognition Arousal/Alertness: Lethargic ?Behavior During Therapy: [facility] for tasks assessed/performed ?Overall Cognitive Status: Within Functional Limits for tasks assessed ?  ?  ?  ?  ?  ?  ?  ?  ?  ?  ?  ?  ?  ?  ?  ?  ?  ?  ?  ? ?  ?Exercises Total Joint Exercises ?Quad Sets: AROM, Strengthening, 10 reps, Both, 15 reps ?Gluteal Sets:  AROM, Strengthening, Both, 10 reps ?Short Arc Quad: AROM, Strengthening, Both, 10 reps ?Hip ABduction/ADduction: AAROM, Strengthening, Both, 10 reps ?Straight Leg Raises: AAROM, Strengthening, Both, 10 reps ?Other Exercises ?Other Exercises: Pt and family review provided on positioning education to promoted BLE knee ext PROM ?Other Exercises: HEP education for BLE QS, GS, and LAQs ? ?  ?General Comments   ?  ?  ? ?Pertinent Vitals/Pain Pain Assessment ?Pain Assessment: 0-10 ?Pain Score: 8  ?Pain Location: RLE ?Pain Descriptors / Indicators: Aching, Sore ?Pain Intervention(s): Repositioned, Premedicated before session, Monitored during session, Patient requesting pain meds-RN notified  ? ? ?Home Living   ?  ?  ?  ?  ?  ?  ?  ?   ?  ?   ?  ?Prior Function    ?  ?  ?   ? ?PT Goals (current goals can now be found in the care plan section) Progress towards PT goals: PT to reassess next treatment ? ?  ?Frequency ? ? ? 7X/week ? ? ? ?  ?PT Plan Current plan remains appropriate  ? ? ?Co-evaluation   ?  ?  ?  ?  ? ?  ?AM-PAC PT "6 Clicks" Mobility   ?Outcome Measure ? Help needed turning from your back to your side while in a flat bed without using bedrails?: A Lot ?Help needed moving from lying on your back to sitting on the side of a flat bed without using bedrails?: A Lot ?Help needed moving to and from a bed to a chair (including a wheelchair)?: Total ?Help needed standing up from a chair using your arms (e.g., wheelchair or bedside chair)?: Total ?Help needed to walk in hospital room?: Total ?Help needed climbing 3-5 steps with a railing? : Total ?6 Click Score: 8 ? ?  ?End of Session Equipment Utilized During Treatment: Oxygen ?Activity Tolerance: Patient limited by fatigue ?Patient left: in bed;with call bell/phone within reach;with bed alarm set;with nursing/sitter in room ?Nurse Communication: Mobility status ?PT Visit Diagnosis: Muscle weakness (generalized) (M62.81);History of falling (Z91.81);Pain ?Pain - Right/Left: Right ?Pain - part of body: Leg ?  ? ? ?Time: 3212-2482 ?PT Time Calculation (min) (ACUTE ONLY): 19 min ? ?Charges:  $Therapeutic Exercise: 8-22 mins          ?          ?D. Royetta Asal PT, DPT ?11/19/21, 1:29 PM ? ? ?

## 2021-11-19 NOTE — Progress Notes (Signed)
? ? ? ?Progress Note  ? ? ?[name]  RJJ:[id number] DOB: 02-22-1971  DOA: 11/04/2021 ?PCP: Pleas Koch, NP  ? ? ? ? ?Brief Narrative:  ? ? ?Medical records reviewed and are as summarized below: ? ?Unnamed [name] is a 51 y.o. female  with PMH CHF, CAD, DM 2, tobacco use, presenting with concern for wound infection on surgical site, she is status post BKA on 10/11/2021, currently being followed at home by wound care nurse.  Patient presented to the ED 11/04/2021 reports purulent drainage, warmth, pain at surgical site, on left stump, as well as wound on right heel.  ? ? ? ? ?Assessment/Plan:  ? ?Principal Problem: ?  Cellulitis of lower extremity ?Active Problems: ?  Uncontrolled type 2 diabetes mellitus with hyperglycemia, with long-term current use of insulin ([place]) ?  Chronic diastolic CHF (congestive heart failure) ([place]) ?  PVD (peripheral vascular disease) ([place]) ?  Hyponatremia ?  Tobacco abuse ?  Chronic kidney disease, stage 3a ([place]) ?  Epilepsy, unspecified, not intractable, without status epilepticus ([place]) ?  Anemia ?  Rash and nonspecific skin eruption consistent with small vessel vasculitis, Ddx includes embolic cause  ?  Wound infection ? ? ? ?Body mass index is 41.13 kg/m?.  (Morbid obesity) ? ? ?Lower extremity cellulitis, eschar on left BKA stump, right heel gangrene, PVD: S/p right above-the-knee amputation, s/p debridement of left BKA stump on 11/10/2021.  Analgesics as needed for pain. ? ?Acute blood loss anemia, chronic anemia: S/p transfusion with 4 units of PRBCs.  Transfuse another unit of PRBC today because of hemoglobin of 6.7.  Monitor H&H and transfuse as needed. ? ?Insulin-dependent type 2 DM with hyperglycemia: Continue insulin glargine and NovoLog as needed ? ?Other comorbidities include epilepsy, chronic diastolic CHF, tobacco use disorder ? ? ? ? ?Diet Order   ? ?       ?  Diet Carb Modified Fluid consistency: Thin; Room service appropriate? Yes  Diet effective now       ?  ? ?  ?   ? ?  ? ? ? ? ? ? ? ? ? ? ? ?Consultants: ?Infectious disease, vascular surgeon ? ?Procedures: ?Right above-the-knee amputation, debridement of left BKA stump on 11/15/2021 ? ? ? ?Medications:  ? ? sodium chloride   Intravenous Once  ? sodium chloride   Intravenous Once  ? aspirin EC  81 mg Oral QAC breakfast  ? busPIRone  30 mg Oral BID  ? Chlorhexidine Gluconate Cloth  6 each Topical Daily  ? Chlorhexidine Gluconate Cloth  6 each Topical Once  ? ciprofloxacin  500 mg Oral BID  ? citalopram  20 mg Oral Daily  ? heparin  5,000 Units Subcutaneous Q12H  ? insulin aspart  0-15 Units Subcutaneous TID WC  ? insulin aspart  4 Units Subcutaneous TID WC  ? insulin glargine-yfgn  35 Units Subcutaneous Daily  ? mometasone-formoterol  2 puff Inhalation BID  ? multivitamin with minerals  1 tablet Oral Daily  ? mupirocin cream   Topical BID  ? nicotine  14 mg Transdermal Daily  ? pregabalin  300 mg Oral BID  ? rOPINIRole  1 mg Oral QHS  ? rosuvastatin  10 mg Oral QHS  ? traZODone  200 mg Oral QHS  ? ?Continuous Infusions: ? ? ?Anti-infectives (From admission, onward)  ? ? Start     Dose/Rate Route Frequency Ordered Stop  ? 11/18/21 0800  ciprofloxacin (CIPRO) tablet 500 mg       ?  500 mg Oral 2 times daily 11/17/21 1412 12/02/21 0759  ? 11/17/21 2200  ciprofloxacin (CIPRO) tablet 500 mg  Status:  Discontinued       ? 500 mg Oral 2 times daily 11/17/21 1138 11/17/21 1412  ? 11/09/21 0600  ertapenem (INVANZ) 1,000 mg in sodium chloride 0.9 % 100 mL IVPB  Status:  Discontinued       ? 1 g ?200 mL/hr over 30 Minutes Intravenous Every 24 hours 11/08/21 1649 11/17/21 1412  ? 11/07/21 1400  ceFEPIme (MAXIPIME) 2 g in sodium chloride 0.9 % 100 mL IVPB       ? 2 g ?200 mL/hr over 30 Minutes Intravenous Every 8 hours 11/07/21 1226 11/08/21 2322  ? 11/06/21 1200  DAPTOmycin (CUBICIN) 500 mg in sodium chloride 0.9 % IVPB  Status:  Discontinued       ? 500 mg ?120 mL/hr over 30 Minutes Intravenous Daily 11/05/21 1657 11/17/21 1138  ? 11/05/21  2200  cefTRIAXone (ROCEPHIN) 2 g in sodium chloride 0.9 % 100 mL IVPB  Status:  Discontinued       ? 2 g ?200 mL/hr over 30 Minutes Intravenous Every 24 hours 11/05/21 1657 11/07/21 1217  ? 11/05/21 2200  metroNIDAZOLE (FLAGYL) tablet 500 mg       ? 500 mg Oral Every 12 hours 11/05/21 1657 11/08/21 2233  ? 11/05/21 1100  vancomycin (VANCOCIN) IVPB 1000 mg/200 mL premix       ? 1,000 mg ?200 mL/hr over 60 Minutes Intravenous On call to O.R. 11/05/21 1013 11/05/21 1954  ? 11/05/21 0130  vancomycin IVPB  Status:  Discontinued       ?Note to Pharmacy: Indication:  MRSA bacteremia ?First Dose: Yes ?Last Day of Therapy:  11/07/2021 ?Labs - Monday:  CBC/D, CMP, and vancomycin trough.  ?Labs - Thursday:  BMP and vancomycin trough  ?Please pull PIC at completion of IV antibiotics  ?Fax labs to [phone] ?Method of administration:Elastomeric ?Metho  ? 1,000 mg Intravenous Every 24 hours 11/05/21 0036 11/05/21 0040  ? 11/04/21 2015  vancomycin (VANCOCIN) IVPB 1000 mg/200 mL premix  Status:  Discontinued       ? 1,000 mg ?200 mL/hr over 60 Minutes Intravenous  Once 11/04/21 2014 11/04/21 2014  ? 11/04/21 1815  vancomycin (VANCOCIN) IVPB 1000 mg/200 mL premix       ? 1,000 mg ?200 mL/hr over 60 Minutes Intravenous  Once 11/04/21 1806 11/04/21 1944  ? ?  ? ? ? ? ? ? ? ? ? ?Family Communication/Anticipated D/C date and plan/Code Status  ? ?DVT prophylaxis: heparin injection 5,000 Units Start: 11/04/21 2000 ? ? ?  Code Status: Full Code ? ?Family Communication: None ?Disposition Plan: Plan to discharge home tomorrow ? ? ?Status is: Inpatient ?Remains inpatient appropriate because: Severe anemia, generalized weakness ? ? ? ? ? ? ?Subjective:  ? ?Interval events noted.  She complains of generalized weakness and drowsiness ? ?Objective:  ? ? ?Vitals:  ? 11/19/21 1345 11/19/21 1412 11/19/21 1541 11/19/21 1655  ?BP: 124/61 129/65 128/84 127/70  ?Pulse: 76 76 67 65  ?Resp: '16 16 18 16  '$ ?Temp: (!) 97.5 ?F (36.4 ?C) 98.3 ?F (36.8 ?C)  98 ?F (36.7 ?C) 97.8 ?F (36.6 ?C)  ?TempSrc: Oral     ?SpO2: 100% 100% 100% 100%  ?Weight:      ?Height:      ? ?No data found. ? ? ?Intake/Output Summary (Last 24 hours) at 11/19/2021 1655 ?Last data filed at  11/19/2021 1345 ?Gross per 24 hour  ?Intake 10 ml  ?Output 2100 ml  ?Net -2090 ml  ? ?Filed Weights  ? 11/15/21 0500 11/15/21 0709 11/17/21 0500  ?Weight: 111.5 kg 111.5 kg 108.7 kg  ? ? ?Exam: ? ?GEN: NAD ?SKIN: Warm and dry ?EYES: Pale but anicteric ?ENT: MMM ?CV: RRR ?PULM: CTA B ?ABD: soft, ND, NT, +BS ?CNS: AAO x 3, non focal ?EXT: Right AKA, left BKA.  Wound VAC connected to left BKA stump wound ? ? ? ? ?Data Reviewed:  ? ?I have personally reviewed following labs and imaging studies: ? ?Labs: ?Labs show the following:  ? ?Basic Metabolic Panel: ?Recent Labs  ?Lab 11/13/21 ?0608 11/17/21 ?1122  ?NA 134* 132*  ?K 4.8 4.7  ?CL 102 103  ?CO2 26 23  ?GLUCOSE 161* 268*  ?BUN 51* 49*  ?CREATININE 1.34* 1.12*  ?CALCIUM 8.5* 8.2*  ? ?GFR ?Estimated Creatinine Clearance: 70.8 mL/min (A) (by C-G formula based on SCr of 1.12 mg/dL (H)). ?Liver Function Tests: ?Recent Labs  ?Lab 11/17/21 ?1122  ?AST 19  ?ALT 13  ?ALKPHOS 109  ?BILITOT 0.5  ?PROT 7.6  ?ALBUMIN 2.3*  ? ?No results for input(s): LIPASE, AMYLASE in the last 168 hours. ?No results for input(s): AMMONIA in the last 168 hours. ?Coagulation profile ?No results for input(s): INR, PROTIME in the last 168 hours. ? ?CBC: ?Recent Labs  ?Lab 11/15/21 ?0865 11/15/21 ?0900 11/16/21 ?0915 11/19/21 ?7846  ?WBC 6.9  --   --  8.7  ?HGB 7.8* 6.8* 7.0* 6.7*  ?HCT 26.6* 25.1*  --  22.6*  ?MCV 83.9  --   --  85.6  ?PLT 278  --   --  245  ? ?Cardiac Enzymes: ?Recent Labs  ?Lab 11/15/21 ?0518  ?CKTOTAL 60  ? ?BNP (last 3 results) ?Recent Labs  ?  12/01/20 ?1250  ?PROBNP 78.0  ? ?CBG: ?Recent Labs  ?Lab 11/17/21 ?1710 11/18/21 ?9629 11/18/21 ?0754 11/18/21 ?1153 11/19/21 ?0730  ?GLUCAP 247* 266* 267* 238* 236*  ? ?D-Dimer: ?No results for input(s): DDIMER in the last 72  hours. ?Hgb A1c: ?No results for input(s): HGBA1C in the last 72 hours. ?Lipid Profile: ?No results for input(s): CHOL, HDL, LDLCALC, TRIG, CHOLHDL, LDLDIRECT in the last 72 hours. ?Thyroid function studies: ?No

## 2021-11-19 NOTE — Progress Notes (Signed)
[facility] ? ?Daily Progress Note with VAC dressing change ? ? ?Subjective  - 4 Days Post-Op ? ?Patient is doing well she states her pain is under excellent control ? ?Objective ?Vitals:  ? 11/19/21 1345 11/19/21 1412 11/19/21 1541 11/19/21 1655  ?BP: 124/61 129/65 128/84 127/70  ?Pulse: 76 76 67 65  ?Resp: '16 16 18 16  '$ ?Temp: (!) 97.5 ?F (36.4 ?C) 98.3 ?F (36.8 ?C) 98 ?F (36.7 ?C) 97.8 ?F (36.6 ?C)  ?TempSrc: Oral     ?SpO2: 100% 100% 100% 100%  ?Weight:      ?Height:      ? ? ?Intake/Output Summary (Last 24 hours) at 11/19/2021 1658 ?Last data filed at 11/19/2021 1345 ?Gross per 24 hour  ?Intake 10 ml  ?Output 2100 ml  ?Net -2090 ml  ? ? ?PULM  Normal effort , no use of accessory muscles ?CV  No JVD, RRR ?Abd      No distended, nontender ?VASC  VAC dressing is intact right below-knee amputation postoperative dressing is intact, staple line of the right below-knee amputation is clean dry and intact flap looks excellent. ? ?Laboratory ?CBC ?   ?Component Value Date/Time  ? WBC 8.7 11/19/2021 0517  ? HGB 6.7 (L) 11/19/2021 0517  ? HGB 12.2 09/16/2021 0847  ? HCT 22.6 (L) 11/19/2021 0517  ? HCT 38.0 09/16/2021 0847  ? PLT 245 11/19/2021 0517  ? PLT 200 09/16/2021 0847  ? ? ?BMET ?   ?Component Value Date/Time  ? NA 132 (L) 11/17/2021 1122  ? NA 137 01/08/2018 1117  ? K 4.7 11/17/2021 1122  ? CL 103 11/17/2021 1122  ? CO2 23 11/17/2021 1122  ? GLUCOSE 268 (H) 11/17/2021 1122  ? BUN 49 (H) 11/17/2021 1122  ? BUN 31 (H) 01/08/2018 1117  ? CREATININE 1.12 (H) 11/17/2021 1122  ? CREATININE 1.59 (H) 07/18/2019 1506  ? CALCIUM 8.2 (L) 11/17/2021 1122  ? GFRNONAA 59 (L) 11/17/2021 1122  ? GFRAA 51 (L) 02/25/2020 1504  ? ? ?Assessment/Planning: ?POD #4 s/p right below-knee amputation with VAC dressing change of the left below-knee amputation wound and debridement ? ?The VAC dressing of the left below-knee amputation is removed the wound is examined no further debridement is needed at this time there is  rich granulation tissue present.  A new VAC dressing is applied.  The wound remains 10 cm x 6 cm (length times with) excellent seal is noted. ?Light gauze dressing is placed on the right below-knee amputation ? ?[name] ? ?11/19/2021, 4:58 PM ? ? ? ?  ?

## 2021-11-20 DIAGNOSIS — T148XXA Other injury of unspecified body region, initial encounter: Secondary | ICD-10-CM | POA: Diagnosis not present

## 2021-11-20 DIAGNOSIS — L03116 Cellulitis of left lower limb: Secondary | ICD-10-CM | POA: Diagnosis not present

## 2021-11-20 DIAGNOSIS — L089 Local infection of the skin and subcutaneous tissue, unspecified: Secondary | ICD-10-CM | POA: Diagnosis not present

## 2021-11-20 LAB — TYPE AND SCREEN
ABO/RH(D): O POS
Antibody Screen: NEGATIVE
Unit division: 0

## 2021-11-20 LAB — CBC
HCT: 25.3 % — ABNORMAL LOW (ref 36.0–46.0)
Hemoglobin: 7.6 g/dL — ABNORMAL LOW (ref 12.0–15.0)
MCH: 25.5 pg — ABNORMAL LOW (ref 26.0–34.0)
MCHC: 30 g/dL (ref 30.0–36.0)
MCV: 84.9 fL (ref 80.0–100.0)
Platelets: 250 10*3/uL (ref 150–400)
RBC: 2.98 MIL/uL — ABNORMAL LOW (ref 3.87–5.11)
RDW: 18.7 % — ABNORMAL HIGH (ref 11.5–15.5)
WBC: 8.3 10*3/uL (ref 4.0–10.5)
nRBC: 0 % (ref 0.0–0.2)

## 2021-11-20 LAB — BPAM RBC
Blood Product Expiration Date: 202305262359
ISSUE DATE / TIME: 202305121353
Unit Type and Rh: 5100

## 2021-11-20 LAB — GLUCOSE, CAPILLARY
Glucose-Capillary: 176 mg/dL — ABNORMAL HIGH (ref 70–99)
Glucose-Capillary: 188 mg/dL — ABNORMAL HIGH (ref 70–99)
Glucose-Capillary: 200 mg/dL — ABNORMAL HIGH (ref 70–99)
Glucose-Capillary: 259 mg/dL — ABNORMAL HIGH (ref 70–99)

## 2021-11-20 LAB — BASIC METABOLIC PANEL
Anion gap: 4 — ABNORMAL LOW (ref 5–15)
BUN: 44 mg/dL — ABNORMAL HIGH (ref 6–20)
CO2: 26 mmol/L (ref 22–32)
Calcium: 8.4 mg/dL — ABNORMAL LOW (ref 8.9–10.3)
Chloride: 107 mmol/L (ref 98–111)
Creatinine, Ser: 0.93 mg/dL (ref 0.44–1.00)
GFR, Estimated: >60 mL/min (ref >60)
Glucose, Bld: 204 mg/dL — ABNORMAL HIGH (ref 70–99)
Potassium: 4.7 mmol/L (ref 3.5–5.1)
Sodium: 137 mmol/L (ref 135–145)

## 2021-11-20 MED ORDER — MUPIROCIN CALCIUM 2 % EX CREA: TOPICAL_CREAM | Freq: Two times a day (BID) | CUTANEOUS | 0 refills | Status: AC

## 2021-11-20 MED ORDER — OXYCODONE HCL 5 MG PO TABS
5.0000 mg | ORAL_TABLET | Freq: Three times a day (TID) | ORAL | 0 refills | Status: DC | PRN
Start: 2021-11-20 — End: 2021-12-02

## 2021-11-20 MED ORDER — CIPROFLOXACIN HCL 500 MG PO TABS: 500.0000 mg | ORAL_TABLET | Freq: Two times a day (BID) | ORAL | 0 refills | Status: AC

## 2021-11-20 NOTE — Progress Notes (Signed)
Blood pressure (!) 124/58, pulse 69, temperature 98.3 ?F (36.8 ?C), temperature source Oral, resp. rate 16, height '5\' 4"'$  (1.626 m), weight 104.3 kg, SpO2 100 %. ?IV removed site c/d/I pt discharge with wound vac in place that was delivered site was c/d/I pt husband transferred pt into car with the help from staff explained packet they both verbalized understanding.  ?

## 2021-11-20 NOTE — Progress Notes (Signed)
?  ?  ?  Daily Progress Note ? ? ?Assessment/Planning:  ? ?POD #5 s/p R BKA, L BKA debridement & VAC placement ? ?Continue wound care as current ?Dr. Ronalee Belts will be back Monday ? ? ?Subjective  - 5 Days Post-Op  ? ?No complaints ? ? ?Objective  ? ?Vitals:  ? 11/19/21 2024 11/20/21 0341 11/20/21 0500 11/20/21 0742  ?BP: 125/67 112/65  (!) 124/58  ?Pulse: 73 67  69  ?Resp: '16 15  16  '$ ?Temp: 97.7 ?F (36.5 ?C) 98.1 ?F (36.7 ?C)  98.3 ?F (36.8 ?C)  ?TempSrc:    Oral  ?SpO2: 97% 100%  100%  ?Weight:   104.3 kg   ?Height:      ? ? ? ?Intake/Output Summary (Last 24 hours) at 11/20/2021 0828 ?Last data filed at 11/20/2021 0208 ?Gross per 24 hour  ?Intake 368.83 ml  ?Output 1700 ml  ?Net -1331.17 ml  ? ? ?VASC R BKA: dsg clean, L BKA: VAC adherent, medial eschar dry  ? ? ?Laboratory  ? ?CBC ? ?  Latest Ref Rng & Units 11/20/2021  ?  4:39 AM 11/19/2021  ?  5:17 AM 11/16/2021  ?  9:15 AM  ?CBC  ?WBC 4.0 - 10.5 K/uL 8.3   8.7     ?Hemoglobin 12.0 - 15.0 g/dL 7.6   6.7   7.0    ?Hematocrit 36.0 - 46.0 % 25.3   22.6     ?Platelets 150 - 400 K/uL 250   245     ? ? ?BMET ?   ?Component Value Date/Time  ? NA 137 11/20/2021 0439  ? NA 137 01/08/2018 1117  ? K 4.7 11/20/2021 0439  ? CL 107 11/20/2021 0439  ? CO2 26 11/20/2021 0439  ? GLUCOSE 204 (H) 11/20/2021 0439  ? BUN 44 (H) 11/20/2021 0439  ? BUN 31 (H) 01/08/2018 1117  ? CREATININE 0.93 11/20/2021 0439  ? CREATININE 1.59 (H) 07/18/2019 1506  ? CALCIUM 8.4 (L) 11/20/2021 0439  ? GFRNONAA >60 11/20/2021 0439  ? GFRAA 51 (L) 02/25/2020 1504  ? ? ? ?Adele Barthel, MD, FACS, FSVS ?Covering for [facility]: [phone] ? ?11/20/2021, 8:28 AM ? ? ? ? ? ? ?

## 2021-11-20 NOTE — Discharge Summary (Signed)
?Physician Discharge Summary ?  ?Patient: [name] MRN: [id number] DOB: 1971/06/10  ?Admit date:     11/04/2021  ?Discharge date: 11/20/21  ?Discharge Physician: Jennye Boroughs  ? ?PCP: Pleas Koch, NP  ? ?Recommendations at discharge:  ? ? ?1. Follow-up with Dr. Delana Meyer, vascular surgeon, as scheduled within 2 weeks of discharge ?2. Follow-up with PCP in 1 week ? ?Discharge Diagnoses: ?Principal Problem: ?  Cellulitis of lower extremity ?Active Problems: ?  Uncontrolled type 2 diabetes mellitus with hyperglycemia, with long-term current use of insulin ([place]) ?  Chronic diastolic CHF (congestive heart failure) ([place]) ?  PVD (peripheral vascular disease) ([place]) ?  Hyponatremia ?  Tobacco abuse ?  Chronic kidney disease, stage 3a ([place]) ?  Epilepsy, unspecified, not intractable, without status epilepticus ([place]) ?  Anemia ?  Rash and nonspecific skin eruption consistent with small vessel vasculitis, Ddx includes embolic cause  ?  Wound infection ? ? ? ? ?Hospital Course: ? ?[name] is a 51 y.o. female  with PMH CHF, CAD, DM 2, tobacco use, who presented to the hospital because of concerns for wound infection to surgical site.  She is status post BKA on 10/11/2021, and was being followed at home by wound care nurse.  She presented to the ED 11/04/2021 reports purulent drainage, warmth, pain at surgical site, on left BKA stump, as well as wound on right heel.  ?  ?She was admitted to the hospital for left lower extremity cellulitis with eschar on the left BKA stump and right heel gangrene.  She was treated with empiric IV antibiotics.  She underwent debridement of left BKA stump and also underwent right above-the-knee amputation.  She developed acute blood loss anemia postoperatively and was transfused with 5 units of packed red blood cells.  She was evaluated by PT and OT recommended further rehabilitation in a skilled nursing facility.  However, patient declined SNF and insisted on going home with home health  therapy.  She understands that she is at increased risk for falls and injury.  She is deemed stable for discharge to home today.  Discharge plan was discussed with the patient and her husband at the bedside. ? ? ? ?Pain control - [employer] Controlled Substance Reporting System database was reviewed. and patient was instructed, not to drive, operate heavy machinery, perform activities at heights, swimming or participation in water activities or provide baby-sitting services while on Pain, Sleep and Anxiety Medications; until their outpatient Physician has advised to do so again. Also recommended to not to take more than prescribed Pain, Sleep and Anxiety Medications.  ?Consultants: Vascular surgeon, infectious disease specialist ?Procedures performed: Right AKA, debridement of left BKA stump ?Disposition: Home health ?Diet recommendation:  ?Discharge Diet Orders (From admission, onward)  ? ?  Start     Ordered  ? 11/20/21 0000  Diet - low sodium heart healthy       ? 11/20/21 1120  ? ?  ?  ? ?  ? ?Cardiac and Carb modified diet ?DISCHARGE MEDICATION: ?Allergies as of 11/20/2021   ? ?   Reactions  ? Adhesive [tape] Rash, Other (See Comments)  ? TAKES OFF THE SKIN (CERTAIN MEDICAL TAPES DO THIS!!)  ? Metoprolol Shortness Of Breath  ? Occurrence of shortness of breath after 3 days  ? Montelukast Shortness Of Breath  ? Morphine Sulfate Anaphylaxis, Shortness Of Breath, Nausea And Vomiting  ? Swollen Throat - Able to tolerate dilaudid  ? Penicillins Anaphylaxis, Hives, Shortness Of Breath  ?  Throat swells ?Has patient had a PCN reaction causing immediate rash, facial/tongue/throat swelling, SOB or lightheadedness with hypotension: Yes ?Has patient had a PCN reaction causing severe rash involving mucus membranes or skin necrosis: No ?Has patient had a PCN reaction that required hospitalization: Yes ?Has patient had a PCN reaction occurring within the last 10 years: No ?If all of the above answers are "NO", then may  proceed with Cephalosporin use.  ? Silicone Rash  ? Takes off the skin (certain medical tapes do this)  ? Diltiazem Swelling  ? Doxycycline Hives  ? Gabapentin Swelling  ? Midodrine   ? Lightheaded and falling down  ? Percocet [oxycodone-acetaminophen] Rash  ? ?  ? ?  ?Medication List  ?  ? ?STOP taking these medications   ? ?Santyl 250 UNIT/GM ointment ?Generic drug: collagenase ?  ?vancomycin  IVPB ?  ? ?  ? ?TAKE these medications   ? ?acetaminophen 325 MG tablet ?Commonly known as: TYLENOL ?Take 2 tablets (650 mg total) by mouth every 4 (four) hours as needed for headache or mild pain. ?  ?albuterol 108 (90 Base) MCG/ACT inhaler ?Commonly known as: VENTOLIN HFA ?Inhale 2 puffs into the lungs every 6 (six) hours as needed for wheezing or shortness of breath. ?  ?ALPRAZolam 1 MG tablet ?Commonly known as: Duanne Moron ?Take 1 tablet (1 mg total) by mouth 4 (four) times daily as needed for anxiety. ?  ?anastrozole 1 MG tablet ?Commonly known as: ARIMIDEX ?Take 1 tablet (1 mg total) by mouth daily. ?  ?aspirin EC 81 MG tablet ?Take 81 mg by mouth daily before breakfast. ?  ?blood glucose meter kit and supplies ?Use up to four times daily as directed. (FOR ICD-10 E10.9, E11.9). ?  ?budesonide-formoterol 80-4.5 MCG/ACT inhaler ?Commonly known as: Symbicort ?INHALE 2 PUFFS BY MOUTH EVERY 12 HOURS TO PREVENT COUGH OR WHEEZING *RINSE MOUTH AFTER EACH USE* ?  ?busPIRone 30 MG tablet ?Commonly known as: BUSPAR ?Take 30 mg by mouth 2 (two) times daily. ?  ?ciprofloxacin 500 MG tablet ?Commonly known as: CIPRO ?Take 1 tablet (500 mg total) by mouth 2 (two) times daily for 8 days. ?  ?citalopram 20 MG tablet ?Commonly known as: CELEXA ?Take 1 tablet (20 mg total) by mouth daily. ?  ?desvenlafaxine 100 MG 24 hr tablet ?Commonly known as: PRISTIQ ?Take 100 mg by mouth daily. ?  ?Empagliflozin-metFORMIN HCl ER 25-1000 MG Tb24 ?Take 1 tablet by mouth daily with breakfast. ?  ?Eylea 2 MG/0.05ML Sosy ?Generic drug: Aflibercept ?  ?glucose  blood test strip ?Use as instructed ?  ?insulin aspart 100 UNIT/ML FlexPen ?Commonly known as: NOVOLOG ?Inject 1-9 Units into the skin 4 (four) times daily -  before meals and at bedtime. Glucose 121 - 150: 1 unit, Glucose 151 - 200: 2 units, Glucose 201 - 250: 3 units, Glucose 251 - 300: 5 units, Glucose 301 - 350: 7 units, Glucose 351 - 400: 9 units, Glucose > 400 call MD ?  ?insulin glargine 100 UNIT/ML Solostar Pen ?Commonly known as: LANTUS ?Inject 35 Units into the skin daily. ?  ?Insulin Pen Needle 32G X 4 MM Misc ?Inject 1 each into the skin 4 (four) times daily -  before meals and at bedtime. Used to give insulin injections twice daily. ?  ?mupirocin cream 2 % ?Commonly known as: BACTROBAN ?Apply topically 2 (two) times daily for 5 days. ?  ?nicotine 14 mg/24hr patch ?Commonly known as: NICODERM CQ - dosed in mg/24 hours ?Place 1  patch (14 mg total) onto the skin daily. ?  ?ondansetron 8 MG tablet ?Commonly known as: ZOFRAN ?One pill every 8 hours as needed for nausea/vomitting. ?  ?oxyCODONE 5 MG immediate release tablet ?Commonly known as: Oxy IR/ROXICODONE ?Take 1 tablet (5 mg total) by mouth every 8 (eight) hours as needed for severe pain. ?  ?OXYGEN ?Inhale 2-4 L into the lungs 2 (two) times daily as needed (shortness of breath). ?  ?pregabalin 300 MG capsule ?Commonly known as: LYRICA ?TAKE 1 CAPSULE BY MOUTH TWICE DAILY FOR  NERVE  PAIN ?  ?ReliOn Ultra Thin Lancets 30G Misc ?Use as directed for blood sugar checks. ?  ?rOPINIRole 1 MG tablet ?Commonly known as: REQUIP ?TAKE 1 TABLET BY MOUTH EVERY DAY AT BEDTIME FOR  RESTLESS  LEG ?  ?rosuvastatin 10 MG tablet ?Commonly known as: CRESTOR ?Take 1 tablet (10 mg total) by mouth at bedtime. ?  ?spironolactone 50 MG tablet ?Commonly known as: ALDACTONE ?Take 50 mg by mouth daily. ?  ?traZODone 100 MG tablet ?Commonly known as: DESYREL ?Take 200 mg by mouth at bedtime. ?  ? ?  ? ?  ?  ? ? ?  ?Discharge Care Instructions  ?(From admission, onward)  ?  ? ? ?   ? ?  Start     Ordered  ? 11/20/21 0000  Discharge wound care:       ?Comments: Per wound care nurse  ? 11/20/21 1120  ? ?  ?  ? ?  ? ? ?Discharge Exam: ?Filed Weights  ? 11/15/21 0709 11/17/21 0500 05

## 2021-11-20 NOTE — Progress Notes (Signed)
Physical Therapy Treatment ?Patient Details ?Name: [name] ?MRN: [id number] ?DOB: 22-Sep-1970 ?Today's Date: 11/20/2021 ? ? ?History of Present Illness Pt is a 51 yo female with PMH that includes PVD, HTN, HLD, DMII, COPD, asthma, CVA, GERD, depression/anxiety, tobacco abuse, seizure d/o, anemia, CAD, CKD3a, dCHF, right breast cancer (s/p mastectomy) with recent prior admission in April, 2023 for left foot ulcer/infection and BLE lower leg pain.  Pt diagnosed during that admission with atherosclerotic occlusive disease bilateral lower extremities with gangrenous changes to the left heel. Pt had R BKA performed on 10/11/21.  Pt now admitted with cellulitis of her LLE extremity now s/p debridement with wound vac placement.  Pt also diagnosed with right foot gangrene with osteomyelitis of the heel and is s/p RLE BKA on 11/15/21.  MD assessment also includes uncontrolled DM and acute blood loss anemia. ? ?  ?PT Comments  ? ? Received patient in staxi chair at medical mall entrance with nursing supervision/assist.  Per report, patient/husband refusing EMS transport home, insistent on transfer to/from personal vehicle at discharge.  ?Requires heavy assist +2 to safely complete SB transfer to car (transfer towards L); constant cuing and education for head/hips relationship and forward trunk lean throughout.  Attempted to provide education to husband on positioning, guarding, use of gait belt; husband voiced understanding, but did not opt to implement any of the techniques recommended by therapist at the time (preferred to assist laterally from driver's side with therapist in front of patient).   ?Verbally reviewed safety needs/considerations for transfer from car to [facility] once home (will be slightly uphill); STRONGLY recommend second person be present to assist/complete transfer. Patient/husband voiced understanding of all information provided.  Continue to insist on completing discharge without EMS, even after observing  transfer to car. ?  ?Recommendations for follow up therapy are one component of a multi-disciplinary discharge planning process, led by the attending physician.  Recommendations may be updated based on patient status, additional functional criteria and insurance authorization. ? ?Follow Up Recommendations ? Skilled nursing-short term rehab (<3 hours/day) ?  ?  ?Assistance Recommended at Discharge    ?Patient can return home with the following Two people to help with walking and/or transfers;A lot of help with bathing/dressing/bathroom;Direct supervision/assist for medications management;Assistance with cooking/housework;Direct supervision/assist for financial management;Assist for transportation;Help with stairs or ramp for entrance ?  ?Equipment Recommendations ?    ?  ?Recommendations for Other Services   ? ? ?  ?Precautions / Restrictions Precautions ?Precautions: Fall  ?  ? ?Mobility ? Bed Mobility ?  ?  ?  ?  ?  ?  ?  ?  ?  ? ?Transfers ?  ?  ?  ?  ?  ?  ?  ?  ?  ?  ?  ? ?Ambulation/Gait ?  ?  ?  ?  ?  ?  ?  ?  ? ? ?Stairs ?  ?  ?  ?  ?  ? ? ?Wheelchair Mobility ?  ? ?Modified Rankin (Stroke Patients Only) ?  ? ? ?  ?Balance   ?  ?  ?  ?  ?  ?  ?  ?  ?  ?  ?  ?  ?  ?  ?  ?  ?  ?  ?  ? ?  ?Cognition Arousal/Alertness: Awake/alert ?Behavior During Therapy: [facility] for tasks assessed/performed ?Overall Cognitive Status: Within Functional Limits for tasks assessed ?  ?  ?  ?  ?  ?  ?  ?  ?  ?  ?  ?  ?  ?  ?  ?  ?  General Comments: Limited insight into safety needs ?  ?  ? ?  ?Exercises Other Exercises ?Other Exercises: Called per nusring to assist with car transfer, in light of patient/family refusing EMS transport.  Received at hospital entrance in staxi chair, L LE wound vac intact. ?Other Exercises: Husband present to observe/assist.  Positioned staxi chair along side of car for transfer; mod/max assist to position SB (husband completed).  Verbally reviewed head/hips relationship and importance of forward trunk lean  throughout transfer; patient voiced understanding, but demonstrated limited integration/carry-over of information during formal transfer.  Reviewed positioning of helper (encouraged husband to be in front of patient, between [facility] and car); however, husband prefers to assist laterally from driver's seat of car.  Therapist assumed position in front of patient for optimal safety.  Assisted with lateral SB transfer towards L from staxi chair to car, mod assist +2 for safety.  Patient able to initiate lateral movement (slight downward slide to car), but requires extensive assist to maintain forward lean and to complete lateral movement and positioning in car seat. ?Other Exercises: Continue to verbally educate patient/husband in technique and safety considerations for safe transfer from car to manual WC upon return home; STRONGLY recommend presence of additional helper for optimal safety.  Husband and patient voice understanding. ? ?  ?General Comments   ?  ?  ? ?Pertinent Vitals/Pain Pain Assessment ?Pain Assessment: Faces ?Faces Pain Scale: Hurts a little bit ?Pain Location: RLE ?Pain Descriptors / Indicators: Aching, Sore ?Pain Intervention(s): Limited activity within patient's tolerance, Monitored during session, Repositioned  ? ? ?Home Living   ?  ?  ?  ?  ?  ?  ?  ?  ?  ?   ?  ?Prior Function    ?  ?  ?   ? ?PT Goals (current goals can now be found in the care plan section) Acute Rehab PT Goals ?Patient Stated Goal: home ?PT Goal Formulation: With patient/family ?Time For Goal Achievement: 11/30/21 ?Potential to Achieve Goals: Fair ?Progress towards PT goals: Progressing toward goals ? ?  ?Frequency ? ? ? 7X/week ? ? ? ?  ?PT Plan Current plan remains appropriate  ? ? ?Co-evaluation   ?  ?  ?  ?  ? ?  ?AM-PAC PT "6 Clicks" Mobility   ?Outcome Measure ? Help needed turning from your back to your side while in a flat bed without using bedrails?: A Lot ?Help needed moving from lying on your back to sitting on the side  of a flat bed without using bedrails?: A Lot ?Help needed moving to and from a bed to a chair (including a wheelchair)?: A Lot ?Help needed standing up from a chair using your arms (e.g., wheelchair or bedside chair)?: Total ?Help needed to walk in hospital room?: Total ?Help needed climbing 3-5 steps with a railing? : Total ?6 Click Score: 9 ? ?  ?End of Session   ?Activity Tolerance: Patient tolerated treatment well ?Patient left:  (in personal car for tranfser home) ?Nurse Communication: Mobility status ?PT Visit Diagnosis: Muscle weakness (generalized) (M62.81);History of falling (Z91.81);Pain ?Pain - Right/Left: Right ?Pain - part of body: Leg ?  ? ? ?Time: 7209-4709 ?PT Time Calculation (min) (ACUTE ONLY): 20 min ? ?Charges:  $Therapeutic Activity: 8-22 mins          ?          ? ?Gara Kincade H. Owens Shark, PT, DPT, NCS ?11/20/21, 2:08 PM ?[phone] ? ? ?

## 2021-11-20 NOTE — TOC Transition Note (Signed)
Transition of Care (TOC) - CM/SW Discharge Note ? ? ?Patient Details  ?Name: [name] ?MRN: [id number] ?Date of Birth: [birth year] ? ?Transition of Care (TOC) CM/SW Contact:  ?Lashanta Elbe E Alain Deschene, LCSW ?Phone Number: ?11/20/2021, 12:20 PM ? ? ?Clinical Narrative:   Patient to DC home today. ?Notified by LPN that wound vac has not been delivered. Called [facility] who confirmed delivery yesterday. Wound vac was located on unit and LPN to place.  ?Patient and spouse confirmed home address isted in chart is correct on [address]. ?Patient and spouse are declining EMS transport even though it is recommended by PT and LPN for safety.  ?CSW left VM for Apolonio Schneiders with [facility] to notify her of DC. ? ? ? ?Final next level of care: [place] ?Barriers to Discharge: Barriers Resolved ? ? ?Patient Goals and CMS Choice ?Patient states their goals for this hospitalization and ongoing recovery are:: home with home health ?CMS Medicare.gov Compare Post Acute Care list provided to:: Patient ?Choice offered to / list presented to : Patient, Spouse ? ?Discharge Placement ?  ?           ?  ?Patient to be transferred to facility by: refuses EMS ?  ?Patient and family notified of of transfer: 11/20/21 ? ?Discharge Plan and Services ?In-house Referral: NA ?Discharge Planning Services: CM Consult ?Post Acute Care Choice: Home Health          ?DME Arranged: Vac ?DME Agency: [facility] ?  ?  ?  ?HH Arranged: PT, OT, RN ?[place] Agency: Other - See comment ?Date HH Agency Contacted: 11/20/21 ?Time [place]: [phone] ?Representative spoke with at [place]: Apolonio Schneiders with [facility] - left VM ? ?Social Determinants of Health (SDOH) Interventions ?  ? ? ?Readmission Risk Interventions ?   ? View : No data to display.  ?  ?  ?  ? ? ? ? ? ?

## 2021-11-21 DIAGNOSIS — Z8673 Personal history of transient ischemic attack (TIA), and cerebral infarction without residual deficits: Secondary | ICD-10-CM | POA: Diagnosis not present

## 2021-11-21 DIAGNOSIS — I5032 Chronic diastolic (congestive) heart failure: Secondary | ICD-10-CM | POA: Diagnosis not present

## 2021-11-21 DIAGNOSIS — I130 Hypertensive heart and chronic kidney disease with heart failure and stage 1 through stage 4 chronic kidney disease, or unspecified chronic kidney disease: Secondary | ICD-10-CM | POA: Diagnosis not present

## 2021-11-21 DIAGNOSIS — E782 Mixed hyperlipidemia: Secondary | ICD-10-CM | POA: Diagnosis not present

## 2021-11-21 DIAGNOSIS — E1169 Type 2 diabetes mellitus with other specified complication: Secondary | ICD-10-CM | POA: Diagnosis not present

## 2021-11-21 DIAGNOSIS — F419 Anxiety disorder, unspecified: Secondary | ICD-10-CM | POA: Diagnosis not present

## 2021-11-21 DIAGNOSIS — E1151 Type 2 diabetes mellitus with diabetic peripheral angiopathy without gangrene: Secondary | ICD-10-CM | POA: Diagnosis not present

## 2021-11-21 DIAGNOSIS — I872 Venous insufficiency (chronic) (peripheral): Secondary | ICD-10-CM | POA: Diagnosis not present

## 2021-11-21 DIAGNOSIS — Z87891 Personal history of nicotine dependence: Secondary | ICD-10-CM | POA: Diagnosis not present

## 2021-11-21 DIAGNOSIS — K219 Gastro-esophageal reflux disease without esophagitis: Secondary | ICD-10-CM | POA: Diagnosis not present

## 2021-11-21 DIAGNOSIS — I4510 Unspecified right bundle-branch block: Secondary | ICD-10-CM | POA: Diagnosis not present

## 2021-11-21 DIAGNOSIS — Z4781 Encounter for orthopedic aftercare following surgical amputation: Secondary | ICD-10-CM | POA: Diagnosis not present

## 2021-11-21 DIAGNOSIS — J42 Unspecified chronic bronchitis: Secondary | ICD-10-CM | POA: Diagnosis not present

## 2021-11-21 NOTE — TOC CM/SW Note (Signed)
5/14: Call from [facility] with [facility] asking schedule for vac changes. Checked with Vascular Surgeon who recommended [facility] agency call Dr. Nino Parsley office Monday for schedule. ?

## 2021-11-22 ENCOUNTER — Telehealth (INDEPENDENT_AMBULATORY_CARE_PROVIDER_SITE_OTHER): Payer: Self-pay

## 2021-11-23 DIAGNOSIS — J42 Unspecified chronic bronchitis: Secondary | ICD-10-CM | POA: Diagnosis not present

## 2021-11-23 DIAGNOSIS — E782 Mixed hyperlipidemia: Secondary | ICD-10-CM | POA: Diagnosis not present

## 2021-11-23 DIAGNOSIS — E1169 Type 2 diabetes mellitus with other specified complication: Secondary | ICD-10-CM | POA: Diagnosis not present

## 2021-11-23 DIAGNOSIS — F419 Anxiety disorder, unspecified: Secondary | ICD-10-CM | POA: Diagnosis not present

## 2021-11-23 DIAGNOSIS — I4510 Unspecified right bundle-branch block: Secondary | ICD-10-CM | POA: Diagnosis not present

## 2021-11-23 DIAGNOSIS — I130 Hypertensive heart and chronic kidney disease with heart failure and stage 1 through stage 4 chronic kidney disease, or unspecified chronic kidney disease: Secondary | ICD-10-CM | POA: Diagnosis not present

## 2021-11-23 DIAGNOSIS — Z8673 Personal history of transient ischemic attack (TIA), and cerebral infarction without residual deficits: Secondary | ICD-10-CM | POA: Diagnosis not present

## 2021-11-23 DIAGNOSIS — I5032 Chronic diastolic (congestive) heart failure: Secondary | ICD-10-CM | POA: Diagnosis not present

## 2021-11-23 DIAGNOSIS — Z87891 Personal history of nicotine dependence: Secondary | ICD-10-CM | POA: Diagnosis not present

## 2021-11-23 DIAGNOSIS — Z4781 Encounter for orthopedic aftercare following surgical amputation: Secondary | ICD-10-CM | POA: Diagnosis not present

## 2021-11-23 DIAGNOSIS — I872 Venous insufficiency (chronic) (peripheral): Secondary | ICD-10-CM | POA: Diagnosis not present

## 2021-11-23 DIAGNOSIS — E1151 Type 2 diabetes mellitus with diabetic peripheral angiopathy without gangrene: Secondary | ICD-10-CM | POA: Diagnosis not present

## 2021-11-23 DIAGNOSIS — K219 Gastro-esophageal reflux disease without esophagitis: Secondary | ICD-10-CM | POA: Diagnosis not present

## 2021-11-23 NOTE — Telephone Encounter (Signed)
Change Vac 3x per week and can leave right BKA can leave open to air or place dry dressing if drainage

## 2021-11-23 NOTE — Telephone Encounter (Signed)
[name] was made aware with wound orders ?

## 2021-11-25 DIAGNOSIS — K219 Gastro-esophageal reflux disease without esophagitis: Secondary | ICD-10-CM | POA: Diagnosis not present

## 2021-11-25 DIAGNOSIS — Z87891 Personal history of nicotine dependence: Secondary | ICD-10-CM | POA: Diagnosis not present

## 2021-11-25 DIAGNOSIS — I4510 Unspecified right bundle-branch block: Secondary | ICD-10-CM | POA: Diagnosis not present

## 2021-11-25 DIAGNOSIS — J42 Unspecified chronic bronchitis: Secondary | ICD-10-CM | POA: Diagnosis not present

## 2021-11-25 DIAGNOSIS — I872 Venous insufficiency (chronic) (peripheral): Secondary | ICD-10-CM | POA: Diagnosis not present

## 2021-11-25 DIAGNOSIS — Z8673 Personal history of transient ischemic attack (TIA), and cerebral infarction without residual deficits: Secondary | ICD-10-CM | POA: Diagnosis not present

## 2021-11-25 DIAGNOSIS — I130 Hypertensive heart and chronic kidney disease with heart failure and stage 1 through stage 4 chronic kidney disease, or unspecified chronic kidney disease: Secondary | ICD-10-CM | POA: Diagnosis not present

## 2021-11-25 DIAGNOSIS — F419 Anxiety disorder, unspecified: Secondary | ICD-10-CM | POA: Diagnosis not present

## 2021-11-25 DIAGNOSIS — I5032 Chronic diastolic (congestive) heart failure: Secondary | ICD-10-CM | POA: Diagnosis not present

## 2021-11-25 DIAGNOSIS — E1169 Type 2 diabetes mellitus with other specified complication: Secondary | ICD-10-CM | POA: Diagnosis not present

## 2021-11-25 DIAGNOSIS — Z4781 Encounter for orthopedic aftercare following surgical amputation: Secondary | ICD-10-CM | POA: Diagnosis not present

## 2021-11-25 DIAGNOSIS — E782 Mixed hyperlipidemia: Secondary | ICD-10-CM | POA: Diagnosis not present

## 2021-11-25 DIAGNOSIS — E1151 Type 2 diabetes mellitus with diabetic peripheral angiopathy without gangrene: Secondary | ICD-10-CM | POA: Diagnosis not present

## 2021-11-26 ENCOUNTER — Telehealth (INDEPENDENT_AMBULATORY_CARE_PROVIDER_SITE_OTHER): Payer: Self-pay | Admitting: Vascular Surgery

## 2021-11-26 DIAGNOSIS — J42 Unspecified chronic bronchitis: Secondary | ICD-10-CM | POA: Diagnosis not present

## 2021-11-26 DIAGNOSIS — Z8673 Personal history of transient ischemic attack (TIA), and cerebral infarction without residual deficits: Secondary | ICD-10-CM | POA: Diagnosis not present

## 2021-11-26 DIAGNOSIS — I130 Hypertensive heart and chronic kidney disease with heart failure and stage 1 through stage 4 chronic kidney disease, or unspecified chronic kidney disease: Secondary | ICD-10-CM | POA: Diagnosis not present

## 2021-11-26 DIAGNOSIS — Z4781 Encounter for orthopedic aftercare following surgical amputation: Secondary | ICD-10-CM | POA: Diagnosis not present

## 2021-11-26 DIAGNOSIS — K219 Gastro-esophageal reflux disease without esophagitis: Secondary | ICD-10-CM | POA: Diagnosis not present

## 2021-11-26 DIAGNOSIS — I4510 Unspecified right bundle-branch block: Secondary | ICD-10-CM | POA: Diagnosis not present

## 2021-11-26 DIAGNOSIS — I5032 Chronic diastolic (congestive) heart failure: Secondary | ICD-10-CM | POA: Diagnosis not present

## 2021-11-26 DIAGNOSIS — I872 Venous insufficiency (chronic) (peripheral): Secondary | ICD-10-CM | POA: Diagnosis not present

## 2021-11-26 DIAGNOSIS — E1151 Type 2 diabetes mellitus with diabetic peripheral angiopathy without gangrene: Secondary | ICD-10-CM | POA: Diagnosis not present

## 2021-11-26 DIAGNOSIS — E782 Mixed hyperlipidemia: Secondary | ICD-10-CM | POA: Diagnosis not present

## 2021-11-26 DIAGNOSIS — E1169 Type 2 diabetes mellitus with other specified complication: Secondary | ICD-10-CM | POA: Diagnosis not present

## 2021-11-26 DIAGNOSIS — Z87891 Personal history of nicotine dependence: Secondary | ICD-10-CM | POA: Diagnosis not present

## 2021-11-26 DIAGNOSIS — F419 Anxiety disorder, unspecified: Secondary | ICD-10-CM | POA: Diagnosis not present

## 2021-11-26 NOTE — Telephone Encounter (Signed)
[employer] LVM to call to give the verbal consent for supplies for patient .  They can be reached at [phone] and reference #[id number].

## 2021-11-26 NOTE — Telephone Encounter (Signed)
Verbal consent has been approve and they will be faxing over the form the be signed by provider

## 2021-11-27 DIAGNOSIS — I4510 Unspecified right bundle-branch block: Secondary | ICD-10-CM | POA: Diagnosis not present

## 2021-11-27 DIAGNOSIS — E1151 Type 2 diabetes mellitus with diabetic peripheral angiopathy without gangrene: Secondary | ICD-10-CM | POA: Diagnosis not present

## 2021-11-27 DIAGNOSIS — J42 Unspecified chronic bronchitis: Secondary | ICD-10-CM | POA: Diagnosis not present

## 2021-11-27 DIAGNOSIS — E1169 Type 2 diabetes mellitus with other specified complication: Secondary | ICD-10-CM | POA: Diagnosis not present

## 2021-11-27 DIAGNOSIS — I130 Hypertensive heart and chronic kidney disease with heart failure and stage 1 through stage 4 chronic kidney disease, or unspecified chronic kidney disease: Secondary | ICD-10-CM | POA: Diagnosis not present

## 2021-11-27 DIAGNOSIS — Z87891 Personal history of nicotine dependence: Secondary | ICD-10-CM | POA: Diagnosis not present

## 2021-11-27 DIAGNOSIS — I5032 Chronic diastolic (congestive) heart failure: Secondary | ICD-10-CM | POA: Diagnosis not present

## 2021-11-27 DIAGNOSIS — I872 Venous insufficiency (chronic) (peripheral): Secondary | ICD-10-CM | POA: Diagnosis not present

## 2021-11-27 DIAGNOSIS — F419 Anxiety disorder, unspecified: Secondary | ICD-10-CM | POA: Diagnosis not present

## 2021-11-27 DIAGNOSIS — Z4781 Encounter for orthopedic aftercare following surgical amputation: Secondary | ICD-10-CM | POA: Diagnosis not present

## 2021-11-27 DIAGNOSIS — K219 Gastro-esophageal reflux disease without esophagitis: Secondary | ICD-10-CM | POA: Diagnosis not present

## 2021-11-27 DIAGNOSIS — Z8673 Personal history of transient ischemic attack (TIA), and cerebral infarction without residual deficits: Secondary | ICD-10-CM | POA: Diagnosis not present

## 2021-11-27 DIAGNOSIS — E782 Mixed hyperlipidemia: Secondary | ICD-10-CM | POA: Diagnosis not present

## 2021-11-29 DIAGNOSIS — E782 Mixed hyperlipidemia: Secondary | ICD-10-CM | POA: Diagnosis not present

## 2021-11-29 DIAGNOSIS — E1151 Type 2 diabetes mellitus with diabetic peripheral angiopathy without gangrene: Secondary | ICD-10-CM | POA: Diagnosis not present

## 2021-11-29 DIAGNOSIS — E1169 Type 2 diabetes mellitus with other specified complication: Secondary | ICD-10-CM | POA: Diagnosis not present

## 2021-11-29 DIAGNOSIS — Z8673 Personal history of transient ischemic attack (TIA), and cerebral infarction without residual deficits: Secondary | ICD-10-CM | POA: Diagnosis not present

## 2021-11-29 DIAGNOSIS — F419 Anxiety disorder, unspecified: Secondary | ICD-10-CM | POA: Diagnosis not present

## 2021-11-29 DIAGNOSIS — I4510 Unspecified right bundle-branch block: Secondary | ICD-10-CM | POA: Diagnosis not present

## 2021-11-29 DIAGNOSIS — I130 Hypertensive heart and chronic kidney disease with heart failure and stage 1 through stage 4 chronic kidney disease, or unspecified chronic kidney disease: Secondary | ICD-10-CM | POA: Diagnosis not present

## 2021-11-29 DIAGNOSIS — K219 Gastro-esophageal reflux disease without esophagitis: Secondary | ICD-10-CM | POA: Diagnosis not present

## 2021-11-29 DIAGNOSIS — I872 Venous insufficiency (chronic) (peripheral): Secondary | ICD-10-CM | POA: Diagnosis not present

## 2021-11-29 DIAGNOSIS — J42 Unspecified chronic bronchitis: Secondary | ICD-10-CM | POA: Diagnosis not present

## 2021-11-29 DIAGNOSIS — Z4781 Encounter for orthopedic aftercare following surgical amputation: Secondary | ICD-10-CM | POA: Diagnosis not present

## 2021-11-29 DIAGNOSIS — I5032 Chronic diastolic (congestive) heart failure: Secondary | ICD-10-CM | POA: Diagnosis not present

## 2021-11-29 DIAGNOSIS — Z87891 Personal history of nicotine dependence: Secondary | ICD-10-CM | POA: Diagnosis not present

## 2021-11-30 DIAGNOSIS — I872 Venous insufficiency (chronic) (peripheral): Secondary | ICD-10-CM | POA: Diagnosis not present

## 2021-11-30 DIAGNOSIS — J42 Unspecified chronic bronchitis: Secondary | ICD-10-CM | POA: Diagnosis not present

## 2021-11-30 DIAGNOSIS — F419 Anxiety disorder, unspecified: Secondary | ICD-10-CM | POA: Diagnosis not present

## 2021-11-30 DIAGNOSIS — I5032 Chronic diastolic (congestive) heart failure: Secondary | ICD-10-CM | POA: Diagnosis not present

## 2021-11-30 DIAGNOSIS — E782 Mixed hyperlipidemia: Secondary | ICD-10-CM | POA: Diagnosis not present

## 2021-11-30 DIAGNOSIS — Z4781 Encounter for orthopedic aftercare following surgical amputation: Secondary | ICD-10-CM | POA: Diagnosis not present

## 2021-11-30 DIAGNOSIS — Z8673 Personal history of transient ischemic attack (TIA), and cerebral infarction without residual deficits: Secondary | ICD-10-CM | POA: Diagnosis not present

## 2021-11-30 DIAGNOSIS — T8744 Infection of amputation stump, left lower extremity: Secondary | ICD-10-CM | POA: Diagnosis not present

## 2021-11-30 DIAGNOSIS — K219 Gastro-esophageal reflux disease without esophagitis: Secondary | ICD-10-CM | POA: Diagnosis not present

## 2021-11-30 DIAGNOSIS — E1169 Type 2 diabetes mellitus with other specified complication: Secondary | ICD-10-CM | POA: Diagnosis not present

## 2021-11-30 DIAGNOSIS — I130 Hypertensive heart and chronic kidney disease with heart failure and stage 1 through stage 4 chronic kidney disease, or unspecified chronic kidney disease: Secondary | ICD-10-CM | POA: Diagnosis not present

## 2021-11-30 DIAGNOSIS — I4510 Unspecified right bundle-branch block: Secondary | ICD-10-CM | POA: Diagnosis not present

## 2021-11-30 DIAGNOSIS — Z87891 Personal history of nicotine dependence: Secondary | ICD-10-CM | POA: Diagnosis not present

## 2021-11-30 DIAGNOSIS — E1151 Type 2 diabetes mellitus with diabetic peripheral angiopathy without gangrene: Secondary | ICD-10-CM | POA: Diagnosis not present

## 2021-12-01 DIAGNOSIS — Z8673 Personal history of transient ischemic attack (TIA), and cerebral infarction without residual deficits: Secondary | ICD-10-CM | POA: Diagnosis not present

## 2021-12-01 DIAGNOSIS — J42 Unspecified chronic bronchitis: Secondary | ICD-10-CM | POA: Diagnosis not present

## 2021-12-01 DIAGNOSIS — K219 Gastro-esophageal reflux disease without esophagitis: Secondary | ICD-10-CM | POA: Diagnosis not present

## 2021-12-01 DIAGNOSIS — I5032 Chronic diastolic (congestive) heart failure: Secondary | ICD-10-CM | POA: Diagnosis not present

## 2021-12-01 DIAGNOSIS — E1151 Type 2 diabetes mellitus with diabetic peripheral angiopathy without gangrene: Secondary | ICD-10-CM | POA: Diagnosis not present

## 2021-12-01 DIAGNOSIS — Z4781 Encounter for orthopedic aftercare following surgical amputation: Secondary | ICD-10-CM | POA: Diagnosis not present

## 2021-12-01 DIAGNOSIS — I4510 Unspecified right bundle-branch block: Secondary | ICD-10-CM | POA: Diagnosis not present

## 2021-12-01 DIAGNOSIS — E782 Mixed hyperlipidemia: Secondary | ICD-10-CM | POA: Diagnosis not present

## 2021-12-01 DIAGNOSIS — E1169 Type 2 diabetes mellitus with other specified complication: Secondary | ICD-10-CM | POA: Diagnosis not present

## 2021-12-01 DIAGNOSIS — Z87891 Personal history of nicotine dependence: Secondary | ICD-10-CM | POA: Diagnosis not present

## 2021-12-01 DIAGNOSIS — I872 Venous insufficiency (chronic) (peripheral): Secondary | ICD-10-CM | POA: Diagnosis not present

## 2021-12-01 DIAGNOSIS — F419 Anxiety disorder, unspecified: Secondary | ICD-10-CM | POA: Diagnosis not present

## 2021-12-01 DIAGNOSIS — I130 Hypertensive heart and chronic kidney disease with heart failure and stage 1 through stage 4 chronic kidney disease, or unspecified chronic kidney disease: Secondary | ICD-10-CM | POA: Diagnosis not present

## 2021-12-01 IMAGING — CT CT HEAD W/O CM
3 series · 15 of 46 positions shown, 18 images · non-contrast
Comparison: 11/05/2020

CLINICAL DATA: Headache, dysuria, history of breast cancer

EXAM:
CT HEAD WITHOUT CONTRAST
TECHNIQUE: Contiguous axial images were obtained from the base of the skull
through the vertex without intravenous contrast.

[Series 2: head wo · axial · 0.41mm/px · z∈[+368,+488]mm · 9 of 29 slices shown, 12 images]
[im 3/29  brain]
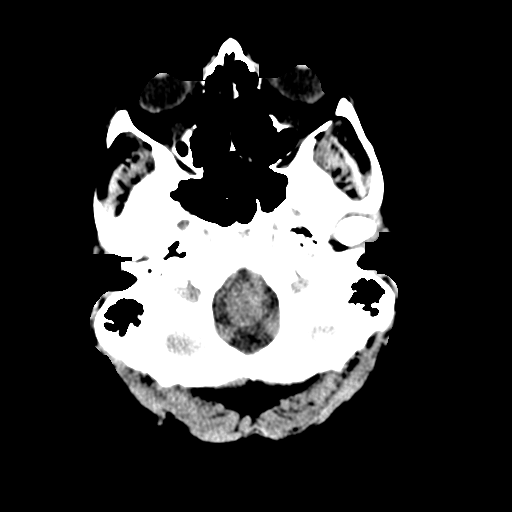
[im 3/29  bone]
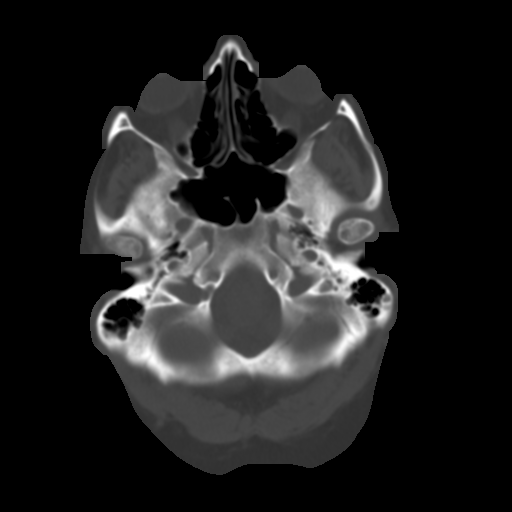
[im 6/29  brain]
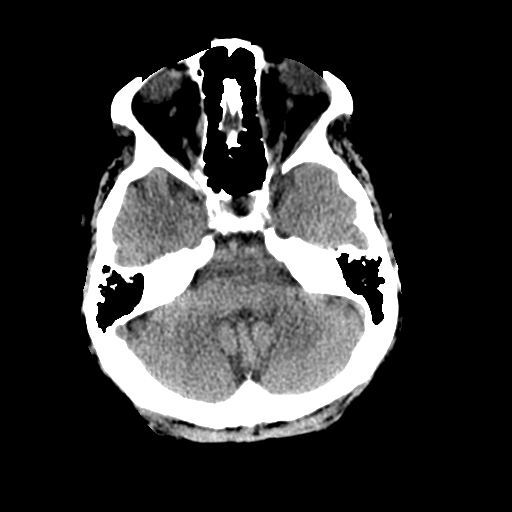
[im 9/29  brain]
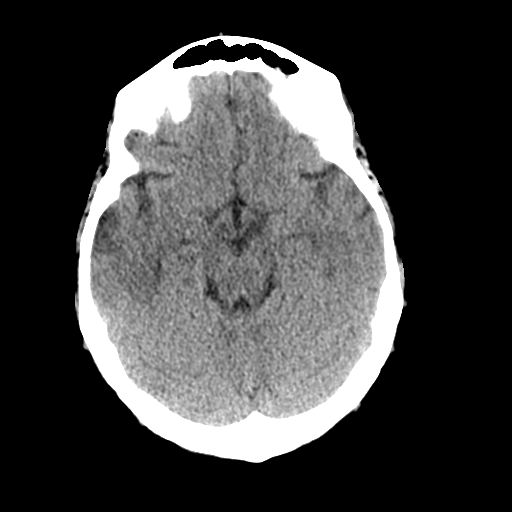
[im 12/29  brain]
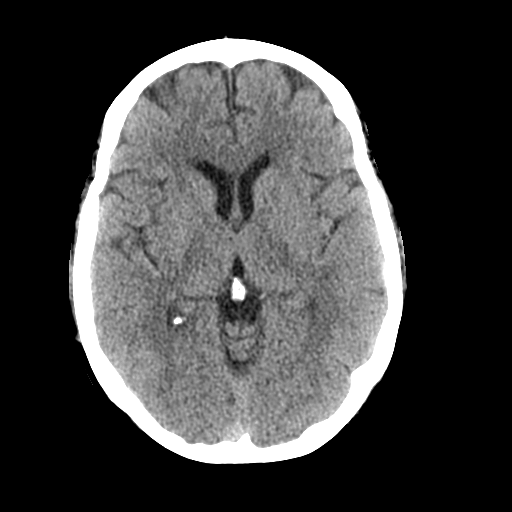
[im 15/29  brain]
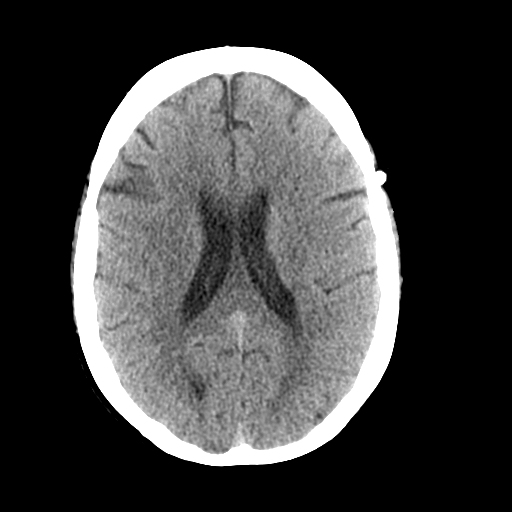
[im 15/29  bone]
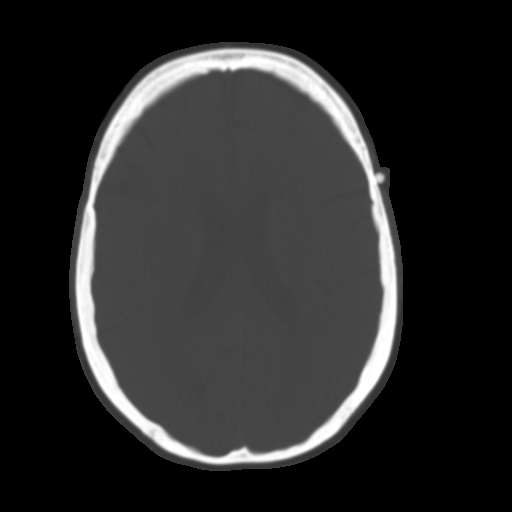
[im 18/29  brain]
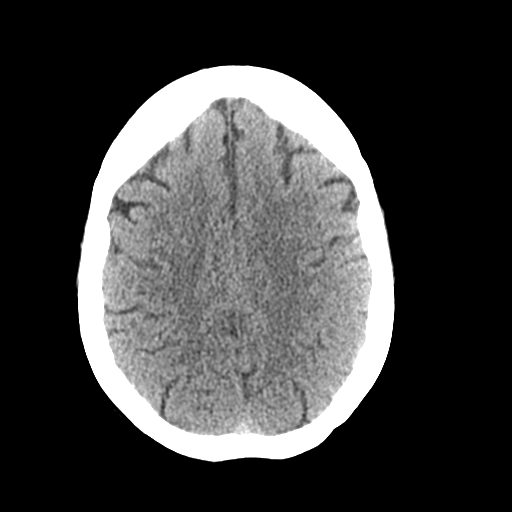
[im 21/29  brain]
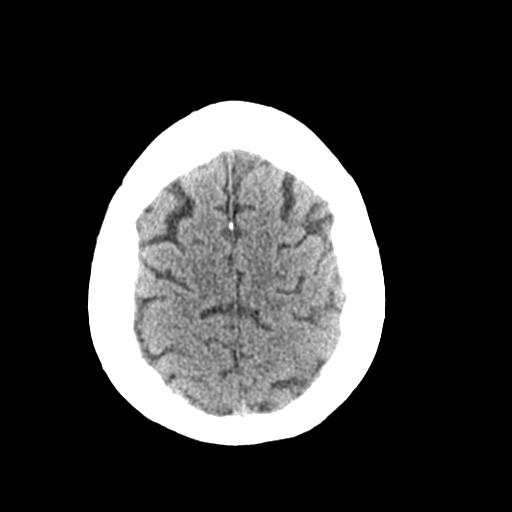
[im 24/29  brain]
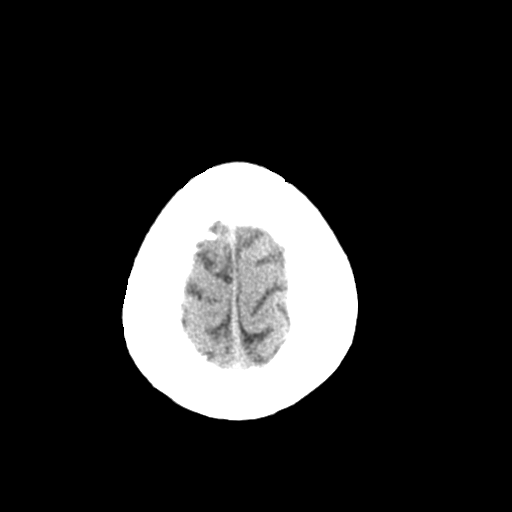
[im 27/29  brain]
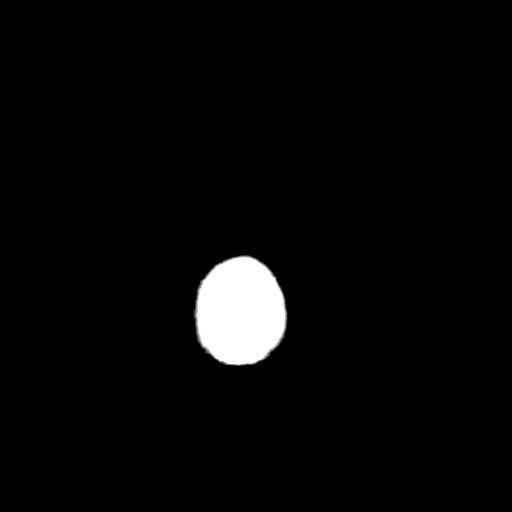
[im 27/29  bone]
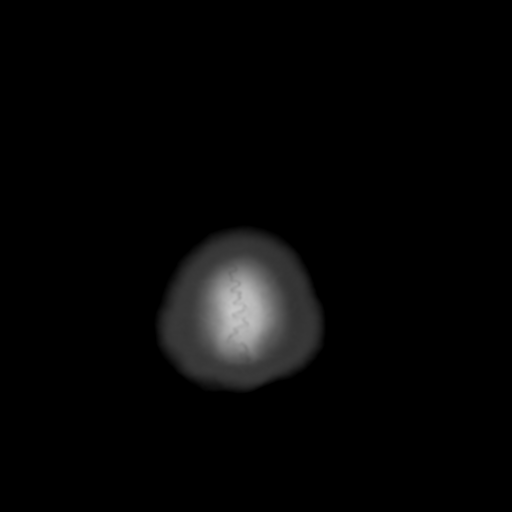

[Series 4: coronal soft tissue · coronal · 0.28mm/px · 3 of 62 slices shown]
[im 21/62  brain]
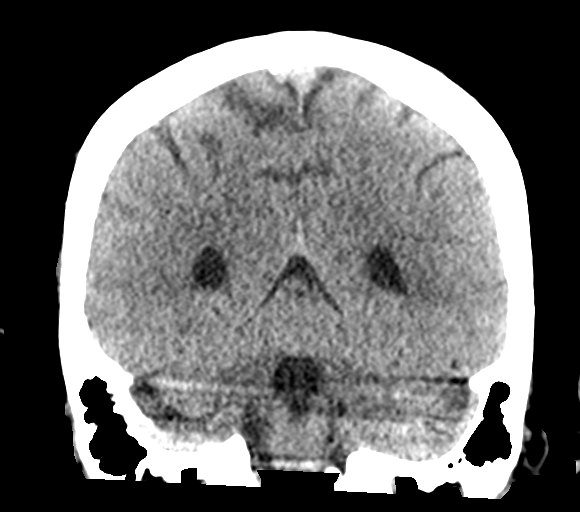
[im 28/62  brain]
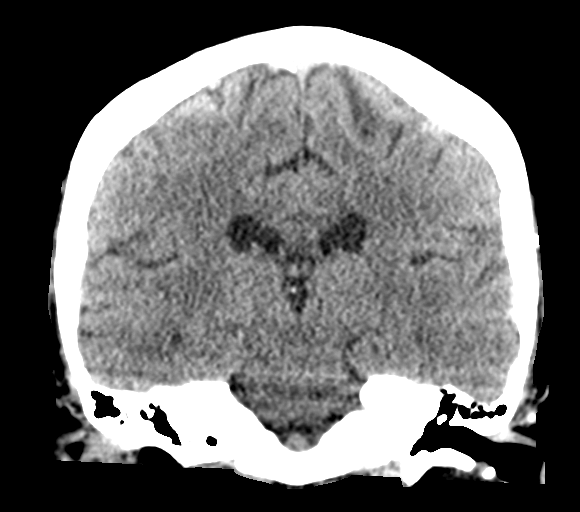
[im 34/62  brain]
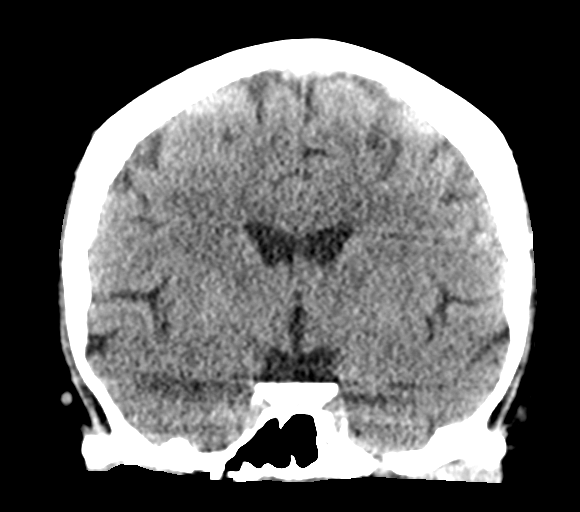

[Series 5: sagittal soft tissue · sagittal · 0.29mm/px · 3 of 51 slices shown]
[im 17/51  brain]
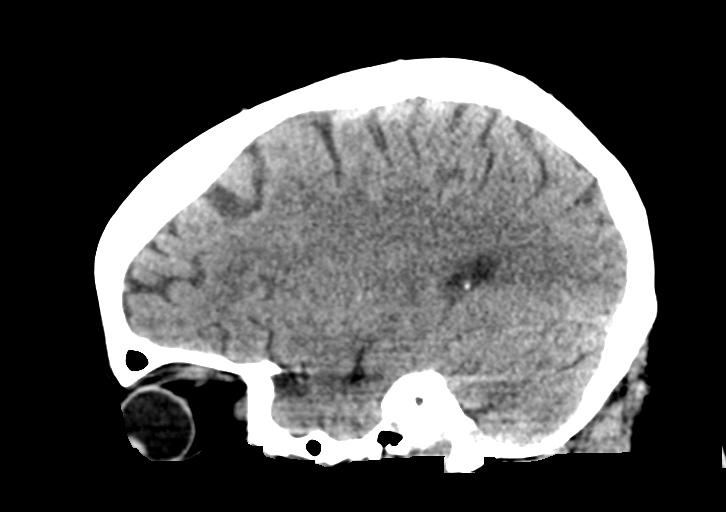
[im 26/51  brain]
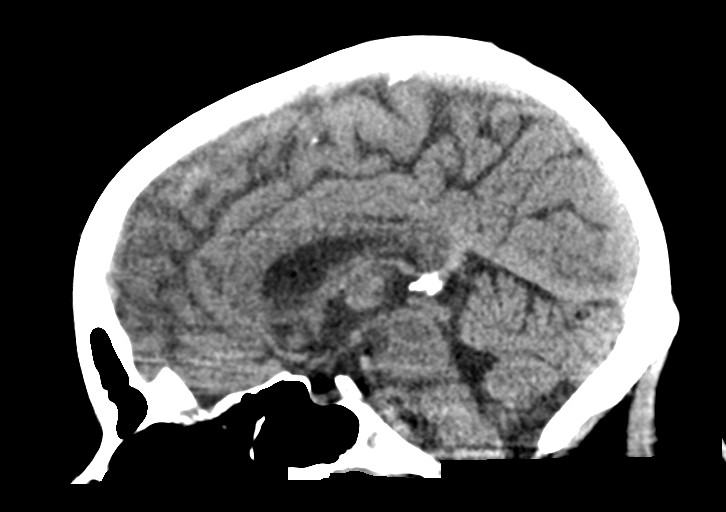
[im 34/51  brain]
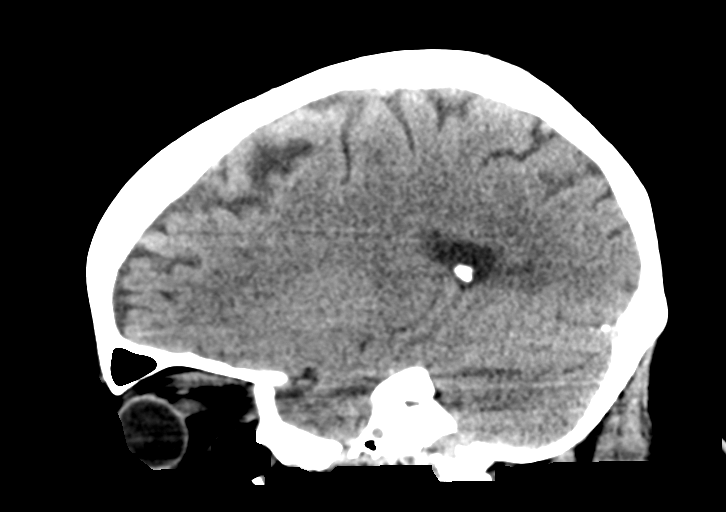

[15 of 46 positions shown; findings below may reference images not displayed]

FINDINGS: Brain: No acute infarct or hemorrhage. Lateral ventricles and
midline structures are unremarkable. No acute extra-axial fluid
collections. No mass effect.

Vascular: No hyperdense vessel or unexpected calcification.

Skull: Normal. Negative for fracture or focal lesion.

Sinuses/Orbits: No acute finding.

Other: Stable metallic BB within the left frontotemporal scalp.
IMPRESSION: 1. Stable head CT, no acute intracranial process.

## 2021-12-01 IMAGING — DX DG CHEST 1V PORT
1 series · 1 of 1 positions shown · non-contrast
Comparison: 01/20/2021

CLINICAL DATA: Headache, dysuria, history of breast cancer

EXAM:
PORTABLE CHEST 1 VIEW

[chest ap]
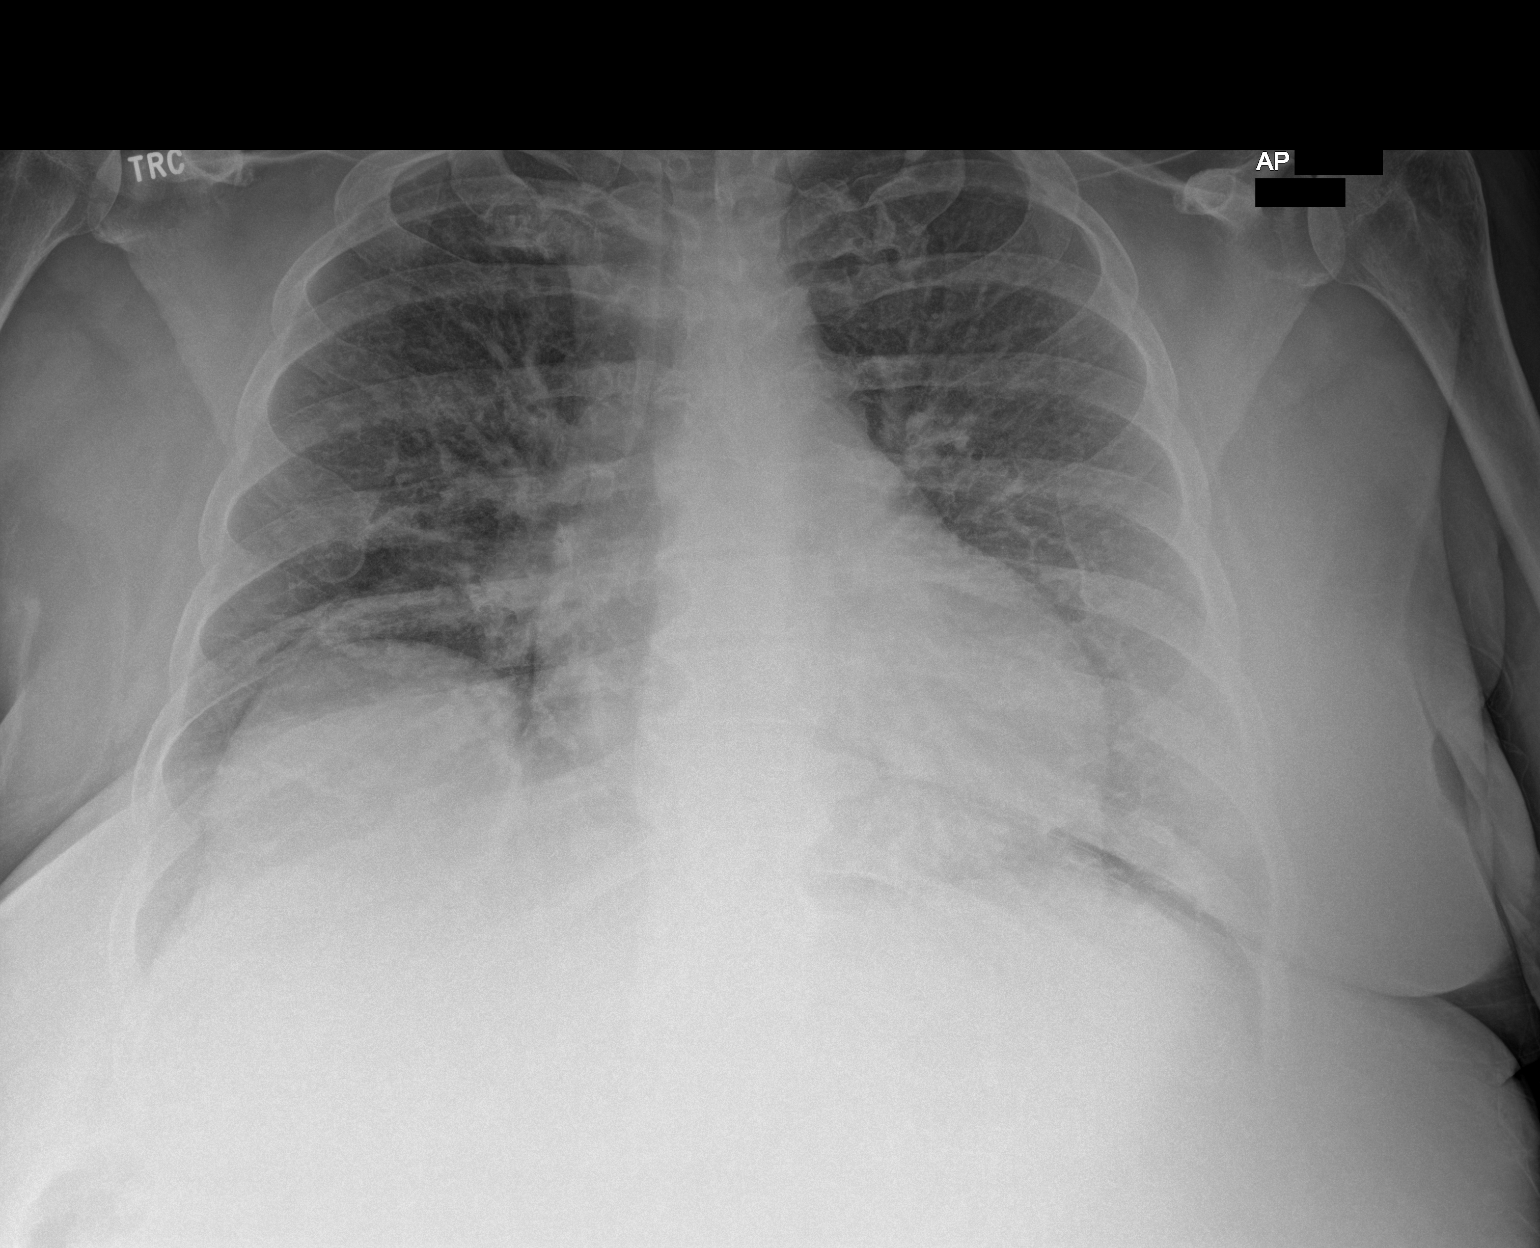

[1 of 1 positions shown; findings below may reference images not displayed]

FINDINGS: Single frontal view of the chest demonstrates a stable cardiac
silhouette. Stable chronic interstitial changes throughout the lungs
compatible with scarring. No airspace disease, effusion, or
pneumothorax. No acute bony abnormality.
IMPRESSION: 1. Stable chronic interstitial lung disease.  No acute process.

## 2021-12-02 ENCOUNTER — Ambulatory Visit: Payer: Medicare Other | Admitting: Primary Care

## 2021-12-02 ENCOUNTER — Telehealth (INDEPENDENT_AMBULATORY_CARE_PROVIDER_SITE_OTHER): Payer: Self-pay

## 2021-12-02 ENCOUNTER — Other Ambulatory Visit (INDEPENDENT_AMBULATORY_CARE_PROVIDER_SITE_OTHER): Payer: Self-pay | Admitting: Vascular Surgery

## 2021-12-02 ENCOUNTER — Other Ambulatory Visit: Payer: Self-pay | Admitting: Primary Care

## 2021-12-02 DIAGNOSIS — E1151 Type 2 diabetes mellitus with diabetic peripheral angiopathy without gangrene: Secondary | ICD-10-CM | POA: Diagnosis not present

## 2021-12-02 DIAGNOSIS — Z8673 Personal history of transient ischemic attack (TIA), and cerebral infarction without residual deficits: Secondary | ICD-10-CM | POA: Diagnosis not present

## 2021-12-02 DIAGNOSIS — Z89519 Acquired absence of unspecified leg below knee: Secondary | ICD-10-CM | POA: Insufficient documentation

## 2021-12-02 DIAGNOSIS — J42 Unspecified chronic bronchitis: Secondary | ICD-10-CM | POA: Diagnosis not present

## 2021-12-02 DIAGNOSIS — K219 Gastro-esophageal reflux disease without esophagitis: Secondary | ICD-10-CM | POA: Diagnosis not present

## 2021-12-02 DIAGNOSIS — I872 Venous insufficiency (chronic) (peripheral): Secondary | ICD-10-CM | POA: Diagnosis not present

## 2021-12-02 DIAGNOSIS — E782 Mixed hyperlipidemia: Secondary | ICD-10-CM | POA: Diagnosis not present

## 2021-12-02 DIAGNOSIS — E1169 Type 2 diabetes mellitus with other specified complication: Secondary | ICD-10-CM | POA: Diagnosis not present

## 2021-12-02 DIAGNOSIS — I5032 Chronic diastolic (congestive) heart failure: Secondary | ICD-10-CM | POA: Diagnosis not present

## 2021-12-02 DIAGNOSIS — I130 Hypertensive heart and chronic kidney disease with heart failure and stage 1 through stage 4 chronic kidney disease, or unspecified chronic kidney disease: Secondary | ICD-10-CM | POA: Diagnosis not present

## 2021-12-02 DIAGNOSIS — Z87891 Personal history of nicotine dependence: Secondary | ICD-10-CM | POA: Diagnosis not present

## 2021-12-02 DIAGNOSIS — Z4781 Encounter for orthopedic aftercare following surgical amputation: Secondary | ICD-10-CM | POA: Diagnosis not present

## 2021-12-02 DIAGNOSIS — Z89511 Acquired absence of right leg below knee: Secondary | ICD-10-CM

## 2021-12-02 DIAGNOSIS — G43709 Chronic migraine without aura, not intractable, without status migrainosus: Secondary | ICD-10-CM

## 2021-12-02 DIAGNOSIS — I4510 Unspecified right bundle-branch block: Secondary | ICD-10-CM | POA: Diagnosis not present

## 2021-12-02 DIAGNOSIS — F419 Anxiety disorder, unspecified: Secondary | ICD-10-CM | POA: Diagnosis not present

## 2021-12-02 MED ORDER — OXYCODONE HCL 5 MG PO TABS: 5.0000 mg | ORAL_TABLET | Freq: Four times a day (QID) | ORAL | 0 refills | Status: DC | PRN

## 2021-12-02 NOTE — Telephone Encounter (Signed)
Pt Left a VM stating that she needed her pain meds refilled.  I spoke with Dr Delana Meyer and he said to refill it. Refill was sent to [facility] on [address]  I let pt know that it was called in and she stated verbal understanding.

## 2021-12-02 NOTE — Telephone Encounter (Signed)
Received refill request for Topamax, however this is not on patient's medication list.  Is she still taking this?  If so, what is she taking Topamax for?

## 2021-12-02 NOTE — Progress Notes (Signed)
Patient is status post recent right below-knee amputation as well as a relatively recent left below-knee amputation which has been complicated with wound dehiscence and abscess formation.  She is still undergoing VAC dressing changes.  Given these recent surgeries and her continued wound care it is perfectly reasonable that she would need narcotic pain medication.  She has been reasonable with the pain medication that she was given at the time of discharge and I will refill her prescription

## 2021-12-03 ENCOUNTER — Telehealth: Payer: Self-pay

## 2021-12-03 DIAGNOSIS — L97812 Non-pressure chronic ulcer of other part of right lower leg with fat layer exposed: Secondary | ICD-10-CM | POA: Diagnosis not present

## 2021-12-03 DIAGNOSIS — E11621 Type 2 diabetes mellitus with foot ulcer: Secondary | ICD-10-CM | POA: Diagnosis not present

## 2021-12-03 NOTE — Telephone Encounter (Signed)
Patient called office states she called pharmacy for refill on [place] that she takes every night for her headaches. She does not have the bottle and is not sure of the name. The pharmacy can not fill unless she knows the name. Patient would like [place] to call her with name.

## 2021-12-03 NOTE — Telephone Encounter (Signed)
I sent this to you this morning as a medication refill question. Refill request came in for Topamax which can be used for headache prevention; however, this has been discontinued from her medication list by someone else.  I was not sure if she was still taking this?

## 2021-12-03 NOTE — Telephone Encounter (Signed)
I presume she is referring to her Topamax as I have prescribed this for her twice daily for headache prevention.  It appears that someone outside of our practice has discontinued this inappropriately.  I will refill for now.

## 2021-12-03 NOTE — Telephone Encounter (Signed)
I have seen phone call from patient from this am. She called needed a refill on her headache medicine that she takes every day. She did not know name of if but did say it was a small orange Pill

## 2021-12-04 DIAGNOSIS — E1169 Type 2 diabetes mellitus with other specified complication: Secondary | ICD-10-CM | POA: Diagnosis not present

## 2021-12-04 DIAGNOSIS — I130 Hypertensive heart and chronic kidney disease with heart failure and stage 1 through stage 4 chronic kidney disease, or unspecified chronic kidney disease: Secondary | ICD-10-CM | POA: Diagnosis not present

## 2021-12-04 DIAGNOSIS — E1151 Type 2 diabetes mellitus with diabetic peripheral angiopathy without gangrene: Secondary | ICD-10-CM | POA: Diagnosis not present

## 2021-12-04 DIAGNOSIS — I872 Venous insufficiency (chronic) (peripheral): Secondary | ICD-10-CM | POA: Diagnosis not present

## 2021-12-04 DIAGNOSIS — Z4781 Encounter for orthopedic aftercare following surgical amputation: Secondary | ICD-10-CM | POA: Diagnosis not present

## 2021-12-04 DIAGNOSIS — F419 Anxiety disorder, unspecified: Secondary | ICD-10-CM | POA: Diagnosis not present

## 2021-12-04 DIAGNOSIS — J42 Unspecified chronic bronchitis: Secondary | ICD-10-CM | POA: Diagnosis not present

## 2021-12-04 DIAGNOSIS — E782 Mixed hyperlipidemia: Secondary | ICD-10-CM | POA: Diagnosis not present

## 2021-12-04 DIAGNOSIS — K219 Gastro-esophageal reflux disease without esophagitis: Secondary | ICD-10-CM | POA: Diagnosis not present

## 2021-12-04 DIAGNOSIS — Z87891 Personal history of nicotine dependence: Secondary | ICD-10-CM | POA: Diagnosis not present

## 2021-12-04 DIAGNOSIS — I5032 Chronic diastolic (congestive) heart failure: Secondary | ICD-10-CM | POA: Diagnosis not present

## 2021-12-04 DIAGNOSIS — I4510 Unspecified right bundle-branch block: Secondary | ICD-10-CM | POA: Diagnosis not present

## 2021-12-04 DIAGNOSIS — Z8673 Personal history of transient ischemic attack (TIA), and cerebral infarction without residual deficits: Secondary | ICD-10-CM | POA: Diagnosis not present

## 2021-12-05 DIAGNOSIS — R2689 Other abnormalities of gait and mobility: Secondary | ICD-10-CM | POA: Diagnosis not present

## 2021-12-05 DIAGNOSIS — E1129 Type 2 diabetes mellitus with other diabetic kidney complication: Secondary | ICD-10-CM | POA: Diagnosis not present

## 2021-12-05 DIAGNOSIS — L97523 Non-pressure chronic ulcer of other part of left foot with necrosis of muscle: Secondary | ICD-10-CM | POA: Diagnosis not present

## 2021-12-05 DIAGNOSIS — B9562 Methicillin resistant Staphylococcus aureus infection as the cause of diseases classified elsewhere: Secondary | ICD-10-CM | POA: Diagnosis not present

## 2021-12-06 DIAGNOSIS — F419 Anxiety disorder, unspecified: Secondary | ICD-10-CM | POA: Diagnosis not present

## 2021-12-06 DIAGNOSIS — E1151 Type 2 diabetes mellitus with diabetic peripheral angiopathy without gangrene: Secondary | ICD-10-CM | POA: Diagnosis not present

## 2021-12-06 DIAGNOSIS — Z87891 Personal history of nicotine dependence: Secondary | ICD-10-CM | POA: Diagnosis not present

## 2021-12-06 DIAGNOSIS — E1169 Type 2 diabetes mellitus with other specified complication: Secondary | ICD-10-CM | POA: Diagnosis not present

## 2021-12-06 DIAGNOSIS — Z8673 Personal history of transient ischemic attack (TIA), and cerebral infarction without residual deficits: Secondary | ICD-10-CM | POA: Diagnosis not present

## 2021-12-06 DIAGNOSIS — Z4781 Encounter for orthopedic aftercare following surgical amputation: Secondary | ICD-10-CM | POA: Diagnosis not present

## 2021-12-06 DIAGNOSIS — K219 Gastro-esophageal reflux disease without esophagitis: Secondary | ICD-10-CM | POA: Diagnosis not present

## 2021-12-06 DIAGNOSIS — I4510 Unspecified right bundle-branch block: Secondary | ICD-10-CM | POA: Diagnosis not present

## 2021-12-06 DIAGNOSIS — I872 Venous insufficiency (chronic) (peripheral): Secondary | ICD-10-CM | POA: Diagnosis not present

## 2021-12-06 DIAGNOSIS — E782 Mixed hyperlipidemia: Secondary | ICD-10-CM | POA: Diagnosis not present

## 2021-12-06 DIAGNOSIS — I130 Hypertensive heart and chronic kidney disease with heart failure and stage 1 through stage 4 chronic kidney disease, or unspecified chronic kidney disease: Secondary | ICD-10-CM | POA: Diagnosis not present

## 2021-12-06 DIAGNOSIS — J42 Unspecified chronic bronchitis: Secondary | ICD-10-CM | POA: Diagnosis not present

## 2021-12-06 DIAGNOSIS — I5032 Chronic diastolic (congestive) heart failure: Secondary | ICD-10-CM | POA: Diagnosis not present

## 2021-12-06 IMAGING — CT CT CHEST-ABD-PELV W/ CM
2 of 5 series · 12 of 36 positions shown, 14 images · IV contrast (omnipaque)
Comparison: CT chest, 07/22/2020, CT abdomen pelvis, 12/20/2017, CT
chest, 01/06/2006

CLINICAL DATA: History of right breast cancer, status post
mastectomy, recent fall, pelvic pain

EXAM:
CT CHEST, ABDOMEN, AND PELVIS WITH CONTRAST
TECHNIQUE: Multidetector CT imaging of the chest, abdomen and pelvis was
performed following the standard protocol during bolus
administration of intravenous contrast.
CONTRAST:  100mL OMNIPAQUE IOHEXOL 350 MG/ML SOLN, additional oral
enteric contrast

[Series 2: axials cap 5.00 · axial · 0.91mm/px · z∈[-1530,-975]mm · 9 of 139 slices shown, 11 images]
[im 14/139  mediastinal]
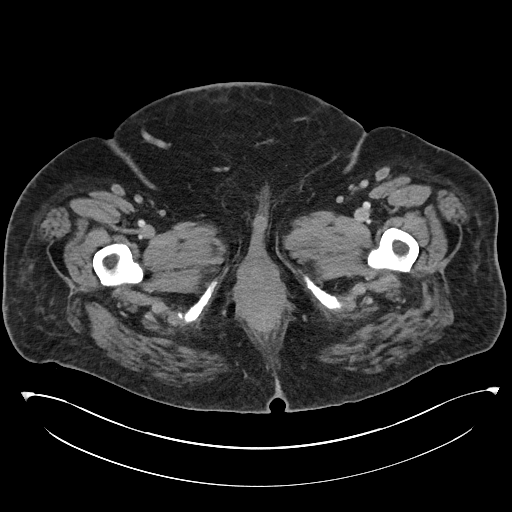
[im 14/139  bone]
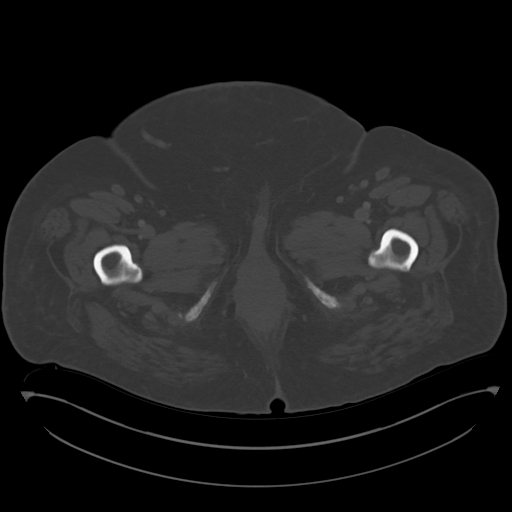
[im 28/139  mediastinal]
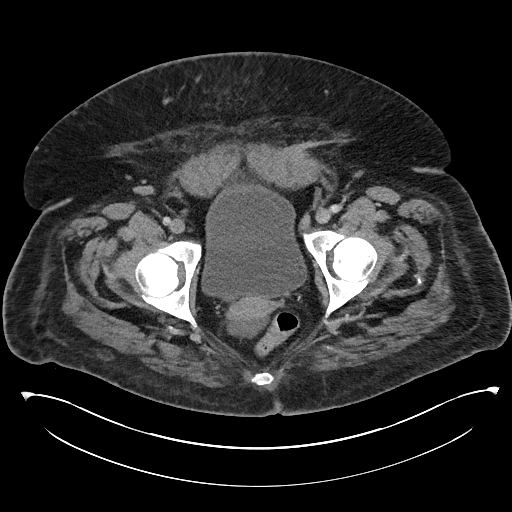
[im 42/139  mediastinal]
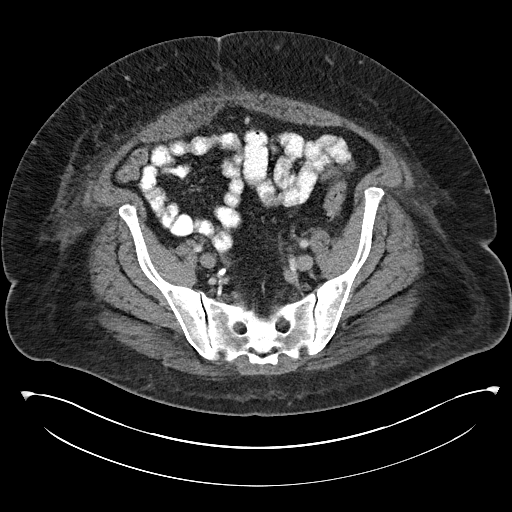
[im 56/139  mediastinal]
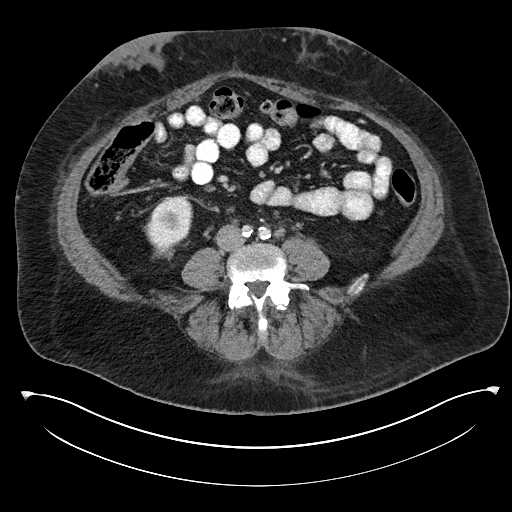
[im 70/139  mediastinal]
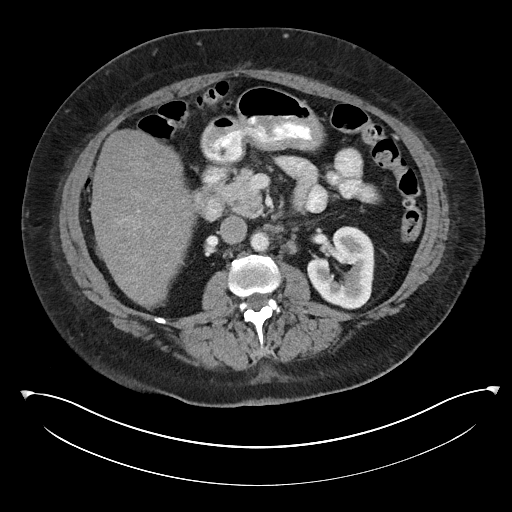
[im 83/139  mediastinal]
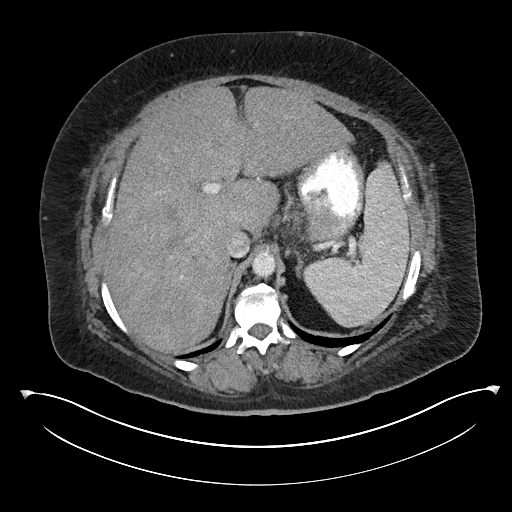
[im 97/139  mediastinal]
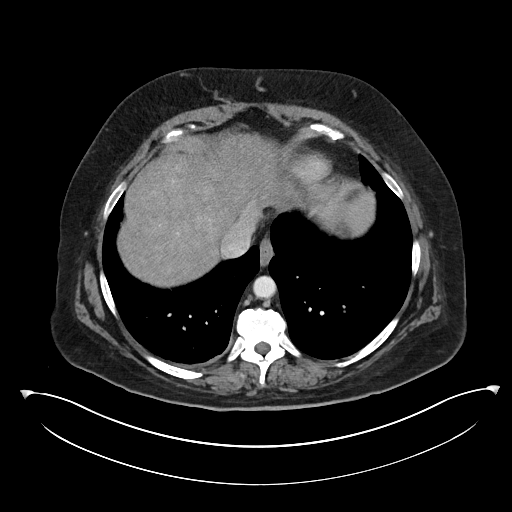
[im 111/139  mediastinal]
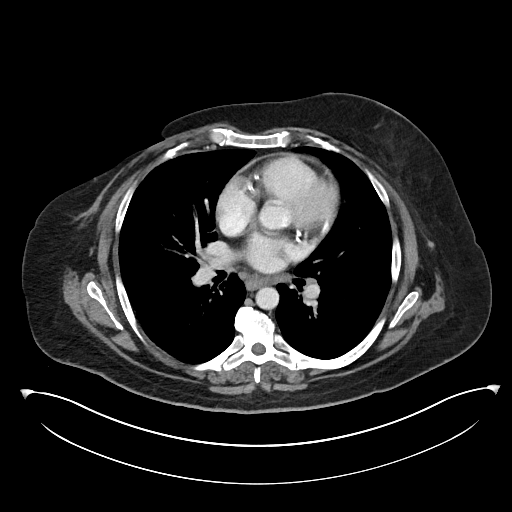
[im 125/139  mediastinal]
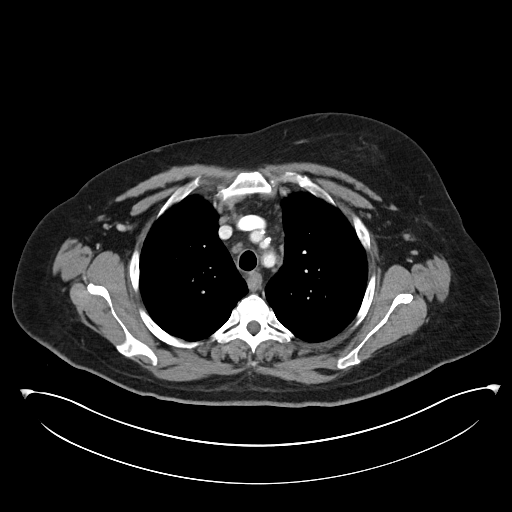
[im 125/139  bone]
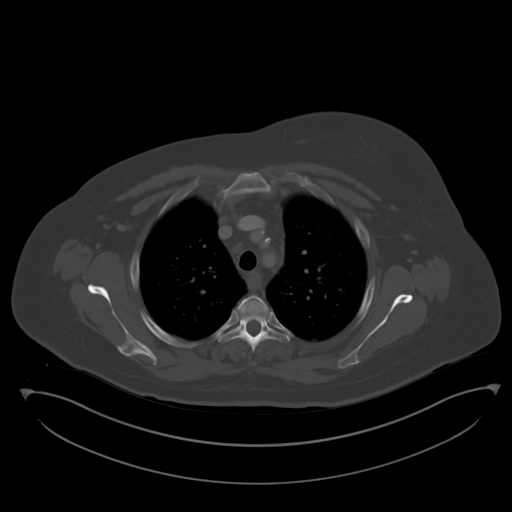

[Series 4: coronals cap 2.00 cor · coronal · 0.81mm/px · 3 of 179 slices shown]
[im 36/179  mediastinal]
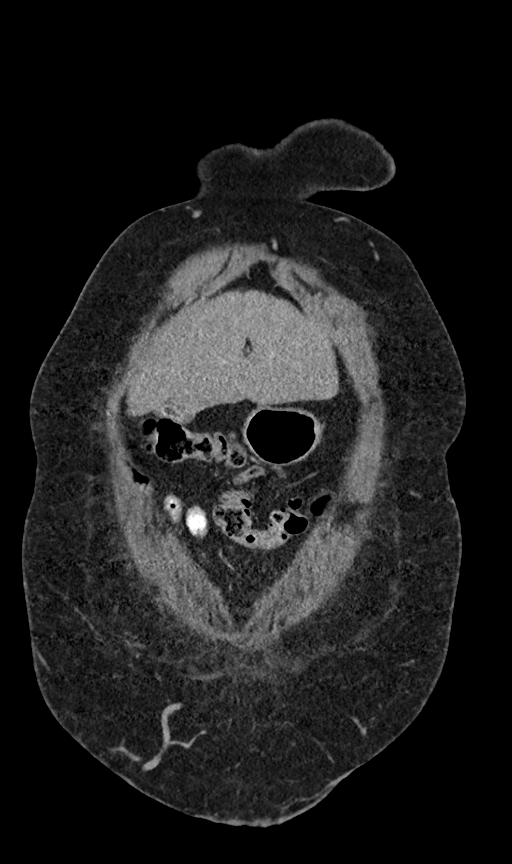
[im 72/179  mediastinal]
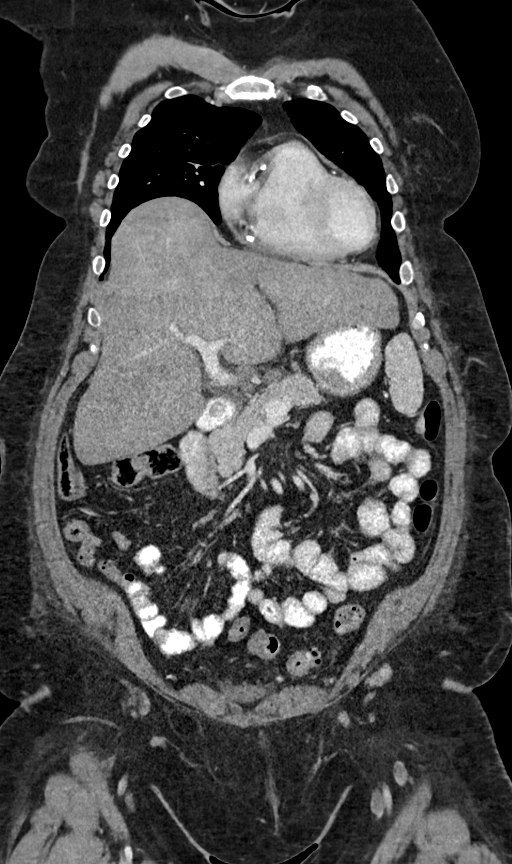
[im 107/179  mediastinal]
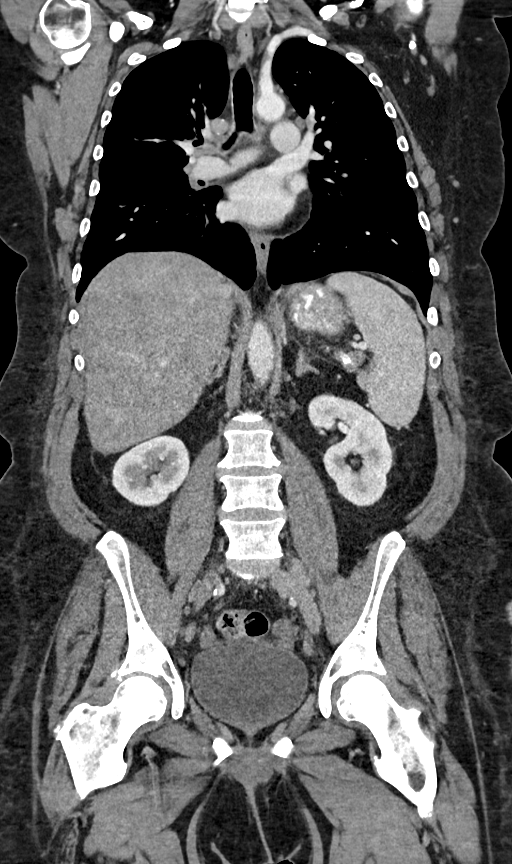

[12 of 36 positions shown; findings below may reference images not displayed]

FINDINGS: CT CHEST FINDINGS

Cardiovascular: Aortic atherosclerosis. Normal heart size.
Three-vessel coronary artery calcifications. No pericardial
effusion.

Mediastinum/Nodes: Unchanged prominent pretracheal and right hilar
nodes (series 2, image 21). Thyroid gland, trachea, and esophagus
demonstrate no significant findings.

Lungs/Pleura: Mild centrilobular emphysema and diffuse bilateral
bronchial wall thickening. There is very extensive, fine
ground-glass centrilobular nodularity most concentrated in the lung
apices, similar to prior examination. There are numerous additional
irregular and ground-glass nodules throughout the lungs, unchanged
compared to prior, the largest in the peripheral right upper lobe
measuring 0.7 cm (series 3, image 63). These nodules are not
significantly changed in comparison to remote prior CT dated
01/06/2006 and consistent with benign incidental sequelae of
infection or inflammation. No pleural effusion or pneumothorax.

Musculoskeletal: Status post right mastectomy. No chest wall mass or
suspicious bone lesions identified.

CT ABDOMEN PELVIS FINDINGS

Hepatobiliary: No solid liver abnormality is seen. Hepatomegaly,
maximum coronal span 22.5 cm. Hepatic steatosis. Contracted
gallbladder with mild gallbladder wall thickening (series 2, image
66). No gallstones or biliary ductal dilatation.

Pancreas: Unremarkable. No pancreatic ductal dilatation or
surrounding inflammatory changes.

Spleen: Normal in size without significant abnormality.

Adrenals/Urinary Tract: Adrenal glands are unremarkable. Kidneys are
normal, without renal calculi, solid lesion, or hydronephrosis.
Bladder is unremarkable.

Stomach/Bowel: Stomach is within normal limits. Appendix appears
normal. No evidence of bowel wall thickening, distention, or
inflammatory changes. Descending and sigmoid diverticulosis.

Vascular/Lymphatic: Aortic atherosclerosis. Unchanged prominent
retroperitoneal, iliac, pelvic sidewall, and inguinal lymph nodes.

Reproductive: No mass or other abnormality.

Other: No abdominal wall hernia or abnormality. Small volume ascites
in the low pelvis (series 2, image 108).

Musculoskeletal: No acute or significant osseous findings.
IMPRESSION: 1. Status post right mastectomy. No evidence of recurrent or
metastatic disease in the chest, abdomen, or pelvis.
2. There is very extensive, fine ground-glass centrilobular
nodularity most concentrated in the lung apices, similar to prior
examination. In the setting of emphysema, these findings most likely
reflect severe smoking-related respiratory
bronchiolitis/desquamative interstitial pneumonitis.
3. There are numerous additional irregular and ground-glass nodules
throughout the lungs, unchanged compared to prior, stable in
comparison to remote prior examination dated 01/07/2016 and
definitively benign sequelae of infection or inflammation.
4. Unchanged prominent pretracheal and right hilar nodes, likewise
stable, benign and reactive.
5. Unchanged prominent retroperitoneal, iliac, pelvic sidewall, and
inguinal lymph nodes, likewise stable, benign and reactive.
6. Hepatomegaly and hepatic steatosis.
7. Contracted gallbladder with mild gallbladder wall thickening. No
gallstones or biliary ductal dilatation.
8. Small volume ascites in the low pelvis, nonspecific.
9. Coronary artery disease.

Aortic Atherosclerosis (W75O2-9FI.I) and Emphysema (W75O2-NBE.T).

## 2021-12-07 ENCOUNTER — Encounter: Payer: Self-pay | Admitting: Vascular Surgery

## 2021-12-08 ENCOUNTER — Telehealth: Payer: Self-pay

## 2021-12-08 DIAGNOSIS — Z4781 Encounter for orthopedic aftercare following surgical amputation: Secondary | ICD-10-CM | POA: Diagnosis not present

## 2021-12-08 DIAGNOSIS — F419 Anxiety disorder, unspecified: Secondary | ICD-10-CM | POA: Diagnosis not present

## 2021-12-08 DIAGNOSIS — E1169 Type 2 diabetes mellitus with other specified complication: Secondary | ICD-10-CM | POA: Diagnosis not present

## 2021-12-08 DIAGNOSIS — Z8673 Personal history of transient ischemic attack (TIA), and cerebral infarction without residual deficits: Secondary | ICD-10-CM | POA: Diagnosis not present

## 2021-12-08 DIAGNOSIS — I872 Venous insufficiency (chronic) (peripheral): Secondary | ICD-10-CM | POA: Diagnosis not present

## 2021-12-08 DIAGNOSIS — J42 Unspecified chronic bronchitis: Secondary | ICD-10-CM | POA: Diagnosis not present

## 2021-12-08 DIAGNOSIS — I130 Hypertensive heart and chronic kidney disease with heart failure and stage 1 through stage 4 chronic kidney disease, or unspecified chronic kidney disease: Secondary | ICD-10-CM | POA: Diagnosis not present

## 2021-12-08 DIAGNOSIS — I5032 Chronic diastolic (congestive) heart failure: Secondary | ICD-10-CM | POA: Diagnosis not present

## 2021-12-08 DIAGNOSIS — J449 Chronic obstructive pulmonary disease, unspecified: Secondary | ICD-10-CM | POA: Diagnosis not present

## 2021-12-08 DIAGNOSIS — Z87891 Personal history of nicotine dependence: Secondary | ICD-10-CM | POA: Diagnosis not present

## 2021-12-08 DIAGNOSIS — E782 Mixed hyperlipidemia: Secondary | ICD-10-CM | POA: Diagnosis not present

## 2021-12-08 DIAGNOSIS — K219 Gastro-esophageal reflux disease without esophagitis: Secondary | ICD-10-CM | POA: Diagnosis not present

## 2021-12-08 DIAGNOSIS — I4510 Unspecified right bundle-branch block: Secondary | ICD-10-CM | POA: Diagnosis not present

## 2021-12-08 DIAGNOSIS — E1151 Type 2 diabetes mellitus with diabetic peripheral angiopathy without gangrene: Secondary | ICD-10-CM | POA: Diagnosis not present

## 2021-12-08 NOTE — Telephone Encounter (Signed)
[facility] called with an update, the patient declined physical therapy and is going to miss all of her appointments this week.

## 2021-12-08 NOTE — Telephone Encounter (Signed)
Noted  

## 2021-12-09 ENCOUNTER — Telehealth (INDEPENDENT_AMBULATORY_CARE_PROVIDER_SITE_OTHER): Payer: Self-pay

## 2021-12-09 ENCOUNTER — Inpatient Hospital Stay
Admission: EM | Admit: 2021-12-09 | Discharge: 2021-12-11 | DRG: 565 | Disposition: A | Discharge status: Home Health | Payer: BC Managed Care – PPO | Attending: General Practice | Admitting: General Practice

## 2021-12-09 ENCOUNTER — Ambulatory Visit (INDEPENDENT_AMBULATORY_CARE_PROVIDER_SITE_OTHER): Payer: Medicare Other | Admitting: Nurse Practitioner

## 2021-12-09 ENCOUNTER — Encounter: Payer: Self-pay | Admitting: Internal Medicine

## 2021-12-09 DIAGNOSIS — Z89512 Acquired absence of left leg below knee: Secondary | ICD-10-CM

## 2021-12-09 DIAGNOSIS — B9562 Methicillin resistant Staphylococcus aureus infection as the cause of diseases classified elsewhere: Secondary | ICD-10-CM | POA: Diagnosis present

## 2021-12-09 DIAGNOSIS — E1169 Type 2 diabetes mellitus with other specified complication: Secondary | ICD-10-CM | POA: Diagnosis present

## 2021-12-09 DIAGNOSIS — I110 Hypertensive heart disease with heart failure: Secondary | ICD-10-CM | POA: Diagnosis present

## 2021-12-09 DIAGNOSIS — F17210 Nicotine dependence, cigarettes, uncomplicated: Secondary | ICD-10-CM | POA: Diagnosis present

## 2021-12-09 DIAGNOSIS — H543 Unqualified visual loss, both eyes: Secondary | ICD-10-CM | POA: Diagnosis present

## 2021-12-09 DIAGNOSIS — T8740 Infection of amputation stump, unspecified extremity: Secondary | ICD-10-CM

## 2021-12-09 DIAGNOSIS — F319 Bipolar disorder, unspecified: Secondary | ICD-10-CM | POA: Diagnosis not present

## 2021-12-09 DIAGNOSIS — T148XXA Other injury of unspecified body region, initial encounter: Secondary | ICD-10-CM | POA: Diagnosis present

## 2021-12-09 DIAGNOSIS — R29898 Other symptoms and signs involving the musculoskeletal system: Secondary | ICD-10-CM | POA: Diagnosis not present

## 2021-12-09 DIAGNOSIS — R7881 Bacteremia: Secondary | ICD-10-CM | POA: Diagnosis present

## 2021-12-09 DIAGNOSIS — L089 Local infection of the skin and subcutaneous tissue, unspecified: Secondary | ICD-10-CM

## 2021-12-09 DIAGNOSIS — Z880 Allergy status to penicillin: Secondary | ICD-10-CM

## 2021-12-09 DIAGNOSIS — Y835 Amputation of limb(s) as the cause of abnormal reaction of the patient, or of later complication, without mention of misadventure at the time of the procedure: Secondary | ICD-10-CM | POA: Diagnosis present

## 2021-12-09 DIAGNOSIS — F419 Anxiety disorder, unspecified: Secondary | ICD-10-CM | POA: Diagnosis present

## 2021-12-09 DIAGNOSIS — Z803 Family history of malignant neoplasm of breast: Secondary | ICD-10-CM

## 2021-12-09 DIAGNOSIS — E440 Moderate protein-calorie malnutrition: Secondary | ICD-10-CM | POA: Diagnosis not present

## 2021-12-09 DIAGNOSIS — E781 Pure hyperglyceridemia: Secondary | ICD-10-CM | POA: Diagnosis present

## 2021-12-09 DIAGNOSIS — G2581 Restless legs syndrome: Secondary | ICD-10-CM | POA: Diagnosis present

## 2021-12-09 DIAGNOSIS — Z6841 Body Mass Index (BMI) 40.0 and over, adult: Secondary | ICD-10-CM | POA: Diagnosis not present

## 2021-12-09 DIAGNOSIS — Z66 Do not resuscitate: Secondary | ICD-10-CM | POA: Diagnosis not present

## 2021-12-09 DIAGNOSIS — S88919A Complete traumatic amputation of unspecified lower leg, level unspecified, initial encounter: Secondary | ICD-10-CM | POA: Diagnosis not present

## 2021-12-09 DIAGNOSIS — Z888 Allergy status to other drugs, medicaments and biological substances status: Secondary | ICD-10-CM

## 2021-12-09 DIAGNOSIS — E669 Obesity, unspecified: Secondary | ICD-10-CM | POA: Diagnosis present

## 2021-12-09 DIAGNOSIS — S81802A Unspecified open wound, left lower leg, initial encounter: Secondary | ICD-10-CM | POA: Diagnosis present

## 2021-12-09 DIAGNOSIS — T8744 Infection of amputation stump, left lower extremity: Secondary | ICD-10-CM | POA: Diagnosis not present

## 2021-12-09 DIAGNOSIS — C50919 Malignant neoplasm of unspecified site of unspecified female breast: Secondary | ICD-10-CM | POA: Diagnosis present

## 2021-12-09 DIAGNOSIS — G4700 Insomnia, unspecified: Secondary | ICD-10-CM | POA: Diagnosis present

## 2021-12-09 DIAGNOSIS — Z885 Allergy status to narcotic agent status: Secondary | ICD-10-CM

## 2021-12-09 DIAGNOSIS — R7989 Other specified abnormal findings of blood chemistry: Secondary | ICD-10-CM | POA: Diagnosis present

## 2021-12-09 DIAGNOSIS — I2510 Atherosclerotic heart disease of native coronary artery without angina pectoris: Secondary | ICD-10-CM | POA: Diagnosis present

## 2021-12-09 DIAGNOSIS — J449 Chronic obstructive pulmonary disease, unspecified: Secondary | ICD-10-CM | POA: Diagnosis present

## 2021-12-09 DIAGNOSIS — I10 Essential (primary) hypertension: Secondary | ICD-10-CM | POA: Diagnosis present

## 2021-12-09 DIAGNOSIS — Z9981 Dependence on supplemental oxygen: Secondary | ICD-10-CM | POA: Diagnosis not present

## 2021-12-09 DIAGNOSIS — E785 Hyperlipidemia, unspecified: Secondary | ICD-10-CM | POA: Diagnosis present

## 2021-12-09 DIAGNOSIS — E1151 Type 2 diabetes mellitus with diabetic peripheral angiopathy without gangrene: Secondary | ICD-10-CM | POA: Diagnosis present

## 2021-12-09 DIAGNOSIS — R000 Tachycardia, unspecified: Secondary | ICD-10-CM | POA: Diagnosis not present

## 2021-12-09 DIAGNOSIS — Z89519 Acquired absence of unspecified leg below knee: Secondary | ICD-10-CM

## 2021-12-09 DIAGNOSIS — S81802D Unspecified open wound, left lower leg, subsequent encounter: Secondary | ICD-10-CM

## 2021-12-09 DIAGNOSIS — F17200 Nicotine dependence, unspecified, uncomplicated: Secondary | ICD-10-CM | POA: Diagnosis present

## 2021-12-09 DIAGNOSIS — Z89511 Acquired absence of right leg below knee: Secondary | ICD-10-CM | POA: Diagnosis not present

## 2021-12-09 DIAGNOSIS — E1165 Type 2 diabetes mellitus with hyperglycemia: Secondary | ICD-10-CM | POA: Diagnosis not present

## 2021-12-09 DIAGNOSIS — Z5329 Procedure and treatment not carried out because of patient's decision for other reasons: Secondary | ICD-10-CM | POA: Diagnosis not present

## 2021-12-09 DIAGNOSIS — Z7982 Long term (current) use of aspirin: Secondary | ICD-10-CM

## 2021-12-09 DIAGNOSIS — Z853 Personal history of malignant neoplasm of breast: Secondary | ICD-10-CM | POA: Diagnosis present

## 2021-12-09 DIAGNOSIS — Z881 Allergy status to other antibiotic agents status: Secondary | ICD-10-CM

## 2021-12-09 DIAGNOSIS — Z743 Need for continuous supervision: Secondary | ICD-10-CM | POA: Diagnosis not present

## 2021-12-09 DIAGNOSIS — D649 Anemia, unspecified: Secondary | ICD-10-CM | POA: Diagnosis present

## 2021-12-09 DIAGNOSIS — E1142 Type 2 diabetes mellitus with diabetic polyneuropathy: Secondary | ICD-10-CM | POA: Diagnosis present

## 2021-12-09 DIAGNOSIS — Z7401 Bed confinement status: Secondary | ICD-10-CM | POA: Diagnosis not present

## 2021-12-09 DIAGNOSIS — Z833 Family history of diabetes mellitus: Secondary | ICD-10-CM

## 2021-12-09 DIAGNOSIS — Z720 Tobacco use: Secondary | ICD-10-CM | POA: Diagnosis not present

## 2021-12-09 DIAGNOSIS — Z86718 Personal history of other venous thrombosis and embolism: Secondary | ICD-10-CM

## 2021-12-09 DIAGNOSIS — Z8249 Family history of ischemic heart disease and other diseases of the circulatory system: Secondary | ICD-10-CM

## 2021-12-09 DIAGNOSIS — F418 Other specified anxiety disorders: Secondary | ICD-10-CM | POA: Diagnosis not present

## 2021-12-09 DIAGNOSIS — Z9011 Acquired absence of right breast and nipple: Secondary | ICD-10-CM | POA: Diagnosis not present

## 2021-12-09 DIAGNOSIS — R6889 Other general symptoms and signs: Secondary | ICD-10-CM | POA: Diagnosis not present

## 2021-12-09 DIAGNOSIS — A4902 Methicillin resistant Staphylococcus aureus infection, unspecified site: Secondary | ICD-10-CM | POA: Diagnosis not present

## 2021-12-09 DIAGNOSIS — Z8673 Personal history of transient ischemic attack (TIA), and cerebral infarction without residual deficits: Secondary | ICD-10-CM

## 2021-12-09 DIAGNOSIS — R748 Abnormal levels of other serum enzymes: Secondary | ICD-10-CM | POA: Diagnosis present

## 2021-12-09 DIAGNOSIS — Z794 Long term (current) use of insulin: Secondary | ICD-10-CM | POA: Diagnosis not present

## 2021-12-09 DIAGNOSIS — I739 Peripheral vascular disease, unspecified: Secondary | ICD-10-CM | POA: Diagnosis present

## 2021-12-09 DIAGNOSIS — Z825 Family history of asthma and other chronic lower respiratory diseases: Secondary | ICD-10-CM

## 2021-12-09 DIAGNOSIS — Z79899 Other long term (current) drug therapy: Secondary | ICD-10-CM

## 2021-12-09 LAB — CBG MONITORING, ED: Glucose-Capillary: 157 mg/dL — ABNORMAL HIGH (ref 70–99)

## 2021-12-09 LAB — GLUCOSE, CAPILLARY: Glucose-Capillary: 165 mg/dL — ABNORMAL HIGH (ref 70–99)

## 2021-12-09 LAB — COMPREHENSIVE METABOLIC PANEL
ALT: 7 U/L (ref 0–44)
AST: 11 U/L — ABNORMAL LOW (ref 15–41)
Albumin: 2.5 g/dL — ABNORMAL LOW (ref 3.5–5.0)
Alkaline Phosphatase: 134 U/L — ABNORMAL HIGH (ref 38–126)
Anion gap: 8 (ref 5–15)
BUN: 16 mg/dL (ref 6–20)
CO2: 23 mmol/L (ref 22–32)
Calcium: 8.6 mg/dL — ABNORMAL LOW (ref 8.9–10.3)
Chloride: 105 mmol/L (ref 98–111)
Creatinine, Ser: 0.73 mg/dL (ref 0.44–1.00)
GFR, Estimated: >60 mL/min (ref >60)
Glucose, Bld: 173 mg/dL — ABNORMAL HIGH (ref 70–99)
Potassium: 3.9 mmol/L (ref 3.5–5.1)
Sodium: 136 mmol/L (ref 135–145)
Total Bilirubin: 1.1 mg/dL (ref 0.3–1.2)
Total Protein: 8.6 g/dL — ABNORMAL HIGH (ref 6.5–8.1)

## 2021-12-09 LAB — CBC
HCT: 27.5 % — ABNORMAL LOW (ref 36.0–46.0)
Hemoglobin: 8.1 g/dL — ABNORMAL LOW (ref 12.0–15.0)
MCH: 24.3 pg — ABNORMAL LOW (ref 26.0–34.0)
MCHC: 29.5 g/dL — ABNORMAL LOW (ref 30.0–36.0)
MCV: 82.6 fL (ref 80.0–100.0)
Platelets: 231 10*3/uL (ref 150–400)
RBC: 3.33 MIL/uL — ABNORMAL LOW (ref 3.87–5.11)
RDW: 17.2 % — ABNORMAL HIGH (ref 11.5–15.5)
WBC: 6.7 10*3/uL (ref 4.0–10.5)
nRBC: 0 % (ref 0.0–0.2)

## 2021-12-09 LAB — LACTIC ACID, PLASMA: Lactic Acid, Venous: 0.9 mmol/L (ref 0.5–1.9)

## 2021-12-09 MED ORDER — TOPIRAMATE 25 MG PO TABS
50.0000 mg | ORAL_TABLET | Freq: Two times a day (BID) | ORAL | Status: DC
  Administered 2021-12-09 – 2021-12-11 (×4): 50 mg via ORAL
  Filled 2021-12-09 (×5): qty 2

## 2021-12-09 MED ORDER — VANCOMYCIN HCL 2000 MG/400ML IV SOLN
2000.0000 mg | Freq: Once | INTRAVENOUS | Status: AC
  Administered 2021-12-09: 2000 mg via INTRAVENOUS
  Filled 2021-12-09: qty 400

## 2021-12-09 MED ORDER — INSULIN ASPART 100 UNIT/ML IJ SOLN: 0.0000 [IU] | Freq: Every day | INTRAMUSCULAR | Status: DC

## 2021-12-09 MED ORDER — METRONIDAZOLE 500 MG PO TABS
500.0000 mg | ORAL_TABLET | Freq: Once | ORAL | Status: AC
  Administered 2021-12-09: 500 mg via ORAL
  Filled 2021-12-09: qty 1

## 2021-12-09 MED ORDER — CITALOPRAM HYDROBROMIDE 10 MG PO TABS
20.0000 mg | ORAL_TABLET | Freq: Every day | ORAL | Status: DC
  Administered 2021-12-10 – 2021-12-11 (×2): 20 mg via ORAL
  Filled 2021-12-09: qty 2

## 2021-12-09 MED ORDER — ALBUTEROL SULFATE (2.5 MG/3ML) 0.083% IN NEBU
3.0000 mL | INHALATION_SOLUTION | Freq: Four times a day (QID) | RESPIRATORY_TRACT | Status: DC | PRN
  Administered 2021-12-10: 3 mL via RESPIRATORY_TRACT
  Filled 2021-12-09: qty 3

## 2021-12-09 MED ORDER — INSULIN GLARGINE-YFGN 100 UNIT/ML ~~LOC~~ SOLN
35.0000 [IU] | Freq: Every day | SUBCUTANEOUS | Status: DC
  Administered 2021-12-09 – 2021-12-10 (×2): 35 [IU] via SUBCUTANEOUS
  Filled 2021-12-09 (×4): qty 0.35

## 2021-12-09 MED ORDER — SPIRONOLACTONE 25 MG PO TABS
50.0000 mg | ORAL_TABLET | Freq: Every day | ORAL | Status: DC
  Administered 2021-12-09 – 2021-12-10 (×2): 50 mg via ORAL
  Filled 2021-12-09 (×2): qty 2

## 2021-12-09 MED ORDER — ACETAMINOPHEN 650 MG RE SUPP: 650.0000 mg | Freq: Four times a day (QID) | RECTAL | Status: DC | PRN

## 2021-12-09 MED ORDER — ROSUVASTATIN CALCIUM 10 MG PO TABS
10.0000 mg | ORAL_TABLET | Freq: Every day | ORAL | Status: DC
  Administered 2021-12-09 – 2021-12-10 (×2): 10 mg via ORAL
  Filled 2021-12-09 (×2): qty 1

## 2021-12-09 MED ORDER — ALPRAZOLAM 1 MG PO TABS
1.0000 mg | ORAL_TABLET | Freq: Four times a day (QID) | ORAL | Status: DC | PRN
  Administered 2021-12-11: 1 mg via ORAL
  Filled 2021-12-09: qty 1

## 2021-12-09 MED ORDER — OXYCODONE HCL 5 MG PO TABS
5.0000 mg | ORAL_TABLET | Freq: Four times a day (QID) | ORAL | Status: DC | PRN
  Administered 2021-12-09 – 2021-12-11 (×6): 5 mg via ORAL
  Filled 2021-12-09 (×6): qty 1

## 2021-12-09 MED ORDER — BUSPIRONE HCL 15 MG PO TABS
30.0000 mg | ORAL_TABLET | Freq: Two times a day (BID) | ORAL | Status: DC
  Administered 2021-12-09 – 2021-12-11 (×4): 30 mg via ORAL
  Filled 2021-12-09 (×5): qty 2

## 2021-12-09 MED ORDER — VANCOMYCIN HCL 1750 MG/350ML IV SOLN
1750.0000 mg | INTRAVENOUS | Status: DC
  Administered 2021-12-10: 1750 mg via INTRAVENOUS
  Filled 2021-12-09 (×2): qty 350

## 2021-12-09 MED ORDER — ONDANSETRON HCL 4 MG/2ML IJ SOLN
4.0000 mg | Freq: Four times a day (QID) | INTRAMUSCULAR | Status: DC | PRN
  Administered 2021-12-11: 4 mg via INTRAVENOUS
  Filled 2021-12-09: qty 2

## 2021-12-09 MED ORDER — SODIUM CHLORIDE 0.9 % IV SOLN
2.0000 g | Freq: Once | INTRAVENOUS | Status: AC
  Administered 2021-12-09: 2 g via INTRAVENOUS
  Filled 2021-12-09: qty 12.5

## 2021-12-09 MED ORDER — NICOTINE 21 MG/24HR TD PT24: 21.0000 mg | MEDICATED_PATCH | Freq: Every day | TRANSDERMAL | Status: DC | PRN

## 2021-12-09 MED ORDER — METRONIDAZOLE 500 MG/100ML IV SOLN
500.0000 mg | Freq: Two times a day (BID) | INTRAVENOUS | Status: AC
  Administered 2021-12-09 – 2021-12-10 (×3): 500 mg via INTRAVENOUS
  Filled 2021-12-09 (×3): qty 100

## 2021-12-09 MED ORDER — POLYETHYLENE GLYCOL 3350 17 G PO PACK: 17.0000 g | PACK | Freq: Every day | ORAL | Status: DC | PRN

## 2021-12-09 MED ORDER — ACETAMINOPHEN 325 MG PO TABS
650.0000 mg | ORAL_TABLET | Freq: Four times a day (QID) | ORAL | Status: DC | PRN
  Administered 2021-12-10: 650 mg via ORAL
  Filled 2021-12-09: qty 2

## 2021-12-09 MED ORDER — INSULIN ASPART 100 UNIT/ML IJ SOLN
0.0000 [IU] | Freq: Three times a day (TID) | INTRAMUSCULAR | Status: DC
  Administered 2021-12-09: 4 [IU] via SUBCUTANEOUS
  Administered 2021-12-11: 3 [IU] via SUBCUTANEOUS
  Administered 2021-12-11: 4 [IU] via SUBCUTANEOUS
  Filled 2021-12-09 (×3): qty 1

## 2021-12-09 MED ORDER — VENLAFAXINE HCL ER 150 MG PO CP24
150.0000 mg | ORAL_CAPSULE | Freq: Every day | ORAL | Status: DC
  Administered 2021-12-10 – 2021-12-11 (×2): 150 mg via ORAL
  Filled 2021-12-09 (×2): qty 1

## 2021-12-09 MED ORDER — TRAZODONE HCL 100 MG PO TABS
200.0000 mg | ORAL_TABLET | Freq: Every evening | ORAL | Status: DC | PRN
  Administered 2021-12-09 – 2021-12-11 (×2): 200 mg via ORAL
  Filled 2021-12-09 (×2): qty 2

## 2021-12-09 MED ORDER — SODIUM CHLORIDE 0.9 % IV SOLN
2.0000 g | Freq: Three times a day (TID) | INTRAVENOUS | Status: AC
  Administered 2021-12-09 – 2021-12-10 (×4): 2 g via INTRAVENOUS
  Filled 2021-12-09: qty 12.5
  Filled 2021-12-09 (×3): qty 2

## 2021-12-09 MED ORDER — ROPINIROLE HCL 1 MG PO TABS
1.0000 mg | ORAL_TABLET | Freq: Every evening | ORAL | Status: DC | PRN
  Filled 2021-12-09: qty 1

## 2021-12-09 MED ORDER — HEPARIN SODIUM (PORCINE) 5000 UNIT/ML IJ SOLN
5000.0000 [IU] | Freq: Three times a day (TID) | INTRAMUSCULAR | Status: AC
  Administered 2021-12-09: 5000 [IU] via SUBCUTANEOUS
  Filled 2021-12-09: qty 1

## 2021-12-09 MED ORDER — ONDANSETRON HCL 4 MG PO TABS: 4.0000 mg | ORAL_TABLET | Freq: Four times a day (QID) | ORAL | Status: DC | PRN

## 2021-12-09 NOTE — Assessment & Plan Note (Addendum)
-   History of MRSA bacteremia - Patient was discharged home with PICC line on 10/14/2021 with vancomycin and completed antibiotic treatment for 4 weeks on 11/07/2021

## 2021-12-09 NOTE — Assessment & Plan Note (Signed)
-   As needed nicotine patch has been ordered - I have discussed extensively with patient at bedside the risk of continued tobacco use including poor wound healing and need for further amputation in addition to CVA, MI, PAD, cancer risks - Patient states that she is not ready and currently unwilling to quit

## 2021-12-09 NOTE — Assessment & Plan Note (Addendum)
-   Status post left BKA on 10/11/2021 complicated by wound dehiscence and abscess formation and is status post left wound vac application on 11/15/21 - Vascular has been consulted-they removed the wound VAC yesterday - Infectious disease has been consulted - ID has requested deep wound culture which was collected by vascular surgery - Preliminary wound culture with rare gram-positive cocci -Continue broad-spectrum antibiotics with cefepime, vancomycin and Flagyl for now

## 2021-12-09 NOTE — Telephone Encounter (Signed)
Patient was scheduled with our office today and called letting us know she wasn't able to make her appointment. She let me know she needed transportation set up. Let the patient know I would try and get that done for her. Patient also let me know there was leaking and puss coming from her stomp, as well a odor that smelled like "rotten eggs" I spoke with NP Eulogio Ditch and she advise to let the patient know she needed to go to the ER. Home health was going to come out to patients home on Saturday with given orders to take out staples and try to schedule appointment in office on Monday 12/13/21. After further talking patient is going to call 911 or non emergency line and go to the ER

## 2021-12-09 NOTE — Assessment & Plan Note (Signed)
-   Query secondary to acute on chronic wound infection - Liver function panel ordered in the a.m.

## 2021-12-09 NOTE — Consult Note (Signed)
NAMECarianna Foster  DOB: 1970/10/04  MRN: [id number]  Date/Time: 12/09/2021 9:00 PM  REQUESTING PROVIDER: Dr. Tobie Poet Subjective:  REASON FOR CONSULT: Left BKA stump infection Patient well-known to me from previous admissions. [name] is a 51 y.o. female with a history of diabetes mellitus, right breast carcinoma status postmastectomy on anastrozole, hypertension, nicotine dependence, COPD, CVA, recurrent MRSA skin abscesses, bilateral BKA, presents to the hospital with redness, pain, at the left BKA site and fever.  As per the patient the nurse come to change her wound VAC 3 times a week.  And he noted that the site was getting very red and swollen and she was sent to the ED today 12/09/2021.Marland Kitchen   In the ED temperature 97.8, BP 130/62, pulse of 77 and respiratory rate of 16 with sats of 94%.  Labs revealed WBC of 6.7, Hb 8.1, platelet 231 and creatinine 0.73. Blood cultures were sent and patient was started on vancomycin cefepime and Flagyl. I am asked to see the patient for management of the left BKA stump infection.   Patient has had multiple hospitalization in the past few months. Patient was recently admitted in the hospital between 11/04/2021 until 11/20/2021 because of purulent discharge from the amputation site of left BKA.  She underwent revision of the amputation site.  There was also gangrene of the left foot with part of the calcaneal tuberosity avulsion.  She underwent right BKA.  Culture had Enterobacter in the left BKA site and Klebsiella, Enterobacter and MRSA in the right gangrene culture prior to her BKA. She received daptomycin till the day of discharge.  She also received ertapenem which was changed to ciprofloxacin p.o. twice daily for 2 more weeks on discharge.  She had received total of 4 weeks of antibiotics. Prior to this hospitalization she was admitted between 10/07/2021 until 10/14/2021 for MRSA bacteremia and MRSA wounds during on the left foot that hospitalization underwent BKA  of the left foot.  She was treated with 6 weeks of IV antibiotics   Past Medical History:  Diagnosis Date   Anxiety    takes Prozac daily   Anxiety    Aortic valve calcification    Asthma    Advair and Spirva daily   Asthma    Bipolar disorder (HCC)    Breast cancer, right breast ([place]) 12/23/2019   CAD (coronary artery disease)    a. LHC 11/2013 done for CP/fluid retention: mild disease in prox LAD, mild-mod disease in mRCA, EF 60% with normal LVEDP. b. Normal nuc 03/2016.   Cancer ([place])    Cellulitis and abscess of trunk 03/12/2020   CHF (congestive heart failure) (HCC)    Chronic diastolic CHF (congestive heart failure) (HCC)    Chronic heart failure with preserved ejection fraction ([place]) 11/16/2018   CKD (chronic kidney disease), stage II    COPD (chronic obstructive pulmonary disease) (HCC)    a. nocturnal O2.   COPD (chronic obstructive pulmonary disease) (HCC)    Coronary artery disease    Decreased urine stream    Diabetes mellitus    Diabetes mellitus without complication (HCC)    Dyspnea    Elevated C-reactive protein (CRP) 01/29/2018   Elevated sedimentation rate 01/29/2018   Elevated serum hCG 01/19/2018   Family history of adverse reaction to anesthesia    mom gets nauseated   Foot pain, bilateral 05/22/2018   GERD (gastroesophageal reflux disease)    takes Pepcid daily   History of blood clots    left leg  3-26yr ago   Hyperlipidemia    Hypertension    Hypertriglyceridemia    Inguinal hernia, left 01/2015   Muscle spasm    Open wound of genital labia    Peripheral neuropathy    RBBB    [place]    [place] 01/[place]   Sinus tachycardia    a. persistent since 2009.   Smokers' cough ([place]    Status post mastectomy, right 01/14/2020   Stroke ([facility] 1989   left sided weakness   TIA (transient ischemic attack)    Tobacco abuse    Vulvar abscess 01/23/2018    Past Surgical History:  Procedure Laterality Date   AMPUTATION Left 10/11/2021   Procedure:  AMPUTATION BELOW KNEE;  Surgeon: DAlgernon Huxley MD;  [facility];  Service: General;  Laterality: Left;   AMPUTATION Right 11/15/2021   Procedure: BELOW KNEE AMPUTATION;  Surgeon: SKatha Cabal MD;  [facility];  Service: Vascular;  Laterality: Right;   APPLICATION OF WOUND VAC Right 01/29/2020   Procedure: APPLICATION OF WOUND VAC;  Surgeon: RRonny Bacon MD;  [facility];  Service: General;  Laterality: Right;   APPLICATION OF WOUND VAC Left 11/15/2021   Procedure: APPLICATION OF WOUND VAC;  Surgeon: SKatha Cabal MD;  [facility]: [facility];  Service: Vascular;  Laterality: Left;   BREAST BIOPSY Right 11/06/2019   uKoreacore path pending venus clip   GRAFT APPLICATION  [id number]  Procedure: GRAFT APPLICATION;  Surgeon: EEdrick Kins DPM;  Location: [facility];  Service: Podiatry;;   HERNIA REPAIR Left    I & D EXTREMITY Left 11/05/2021   Procedure: IRRIGATION AND DEBRIDEMENT LEFT BELOW KNEE STUMP;  Surgeon: SKatha Cabal MD;  Location: [facility];  Service: Vascular;  Laterality: Left;   INCISION AND DRAINAGE Bilateral 09/21/2021   Procedure: INCISION AND DRAINAGE;  Surgeon: EEdrick Kins DPM;  Location: [facility];  Service: Podiatry;  Laterality: Bilateral;   INCISION AND DRAINAGE ABSCESS N/A 01/26/2018   Procedure: INCISION AND DEBRIDEMENT OF VULVAR NECROTIZING SOFT TISSUE INFECTION;  Surgeon: WGreer Pickerel MD;  [place]  Service: General;  Laterality: N/A;   INCISION AND DRAINAGE ABSCESS N/A 07/21/2021   Procedure: INCISION AND DRAINAGE ABSCESS Scalp;  Surgeon: BRobert Bellow MD;  [place];  Service: General;  Laterality: N/A;  scalp and right abdomen   INCISION AND DRAINAGE PERIRECTAL ABSCESS N/A 01/22/2018   Procedure: IRRIGATION AND DEBRIDEMENT LABIAL/VULVAR AREA;  Surgeon: BCoralie Keens MD;  [place] [facility]  Service: General;  Laterality: N/A;   INCISION AND DRAINAGE PERIRECTAL ABSCESS N/A 01/29/2018   Procedure: IRRIGATION AND  DEBRIDEMENT VULVA;  Surgeon: HExcell Seltzer MD;  [place]  Service: General;  Laterality: N/A;   INGUINAL HERNIA REPAIR Left 04/08/2015   Procedure: OPEN LEFT INGUINAL HERNIA REPAIR WITH MESH;  Surgeon: ARalene Ok MD;  [place]  Service: General;  Laterality: Left;   INSERTION OF MESH Left 04/08/2015   Procedure: INSERTION OF MESH;  Surgeon: ARalene Ok MD;  [place] [facility]  Service: General;  Laterality: Left;   IRRIGATION AND DEBRIDEMENT ABSCESS N/A 07/21/2021   Procedure: IRRIGATION AND DEBRIDEMENT ABSCESS abdomen;  Surgeon: BRobert Bellow MD;  Location: [facility];  Service: General;  Laterality: N/A;   LAPAROSCOPY     Endometriosis   LEFT HEART CATHETERIZATION WITH CORONARY ANGIOGRAM N/A 12/05/2013   Procedure: LEFT HEART CATHETERIZATION WITH CORONARY ANGIOGRAM;  Surgeon: JJettie Booze MD;  [facility];  Service: Cardiovascular;  Laterality: N/A;   LOWER EXTREMITY ANGIOGRAPHY Left 09/22/2021   Procedure: Lower Extremity Angiography;  Surgeon: Katha Cabal, MD;  Location: [place] [facility];  Service: Cardiovascular;  Laterality: Left;   LOWER EXTREMITY ANGIOGRAPHY Right 11/09/2021   Procedure: Lower Extremity Angiography;  Surgeon: Katha Cabal, MD;  Location: [place] [facility];  Service: Cardiovascular;  Laterality: Right;   MASTECTOMY Right    MINOR APPLICATION OF WOUND VAC Left 11/05/2021   Procedure: MINOR APPLICATION OF WOUND VAC;  Surgeon: Katha Cabal, MD;  Location: [facility];  Service: Vascular;  Laterality: Left;   right kidney drained     SIMPLE MASTECTOMY WITH AXILLARY SENTINEL NODE BIOPSY Right 12/23/2019   Procedure: Right SIMPLE MASTECTOMY WITH AXILLARY SENTINEL NODE BIOPSY;  Surgeon: Ronny Bacon, MD;  Location: [facility];  Service: General;  Laterality: Right;   TEE WITHOUT CARDIOVERSION N/A 11/28/2013   Procedure: TRANSESOPHAGEAL ECHOCARDIOGRAM (TEE);  Surgeon: Thayer Headings, MD;  Location: [place];  Service: Cardiovascular;  Laterality: N/A;   TEE WITHOUT CARDIOVERSION N/A 10/13/2021   Procedure: TRANSESOPHAGEAL ECHOCARDIOGRAM (TEE);  Surgeon: Corey Skains, MD;  Location: [facility];  Service: Cardiovascular;  Laterality: N/A;   WOUND DEBRIDEMENT Right 01/29/2020   Procedure: DEBRIDEMENT WOUND, Excisional debridement skin, subcutaneous and muscle right chest wall;  Surgeon: Ronny Bacon, MD;  Location: [facility];  Service: General;  Laterality: Right;    Social History   Socioeconomic History   Marital status: Married    Spouse name: Not on file   Number of children: 2   Years of education: Not on file   Highest education level: Not on file  Occupational History   Not on file  Tobacco Use   Smoking status: Every Day    Packs/day: 0.25    Years: 36.00    Pack years: 9.00    Types: Cigarettes    Start date: 07/11/1981   Smokeless tobacco: Never   Tobacco comments:    1 ppd now  Vaping Use   Vaping Use: Some days  Substance and Sexual Activity   Alcohol use: No   Drug use: No   Sexual activity: Not Currently  Other Topics Concern   Not on file  Social History Narrative   ** Merged History Encounter **       Lives in [place]   Married but lives with Boyfriend - not legally separated   Disabled - for Bi[employer]olar, Seizure disorder, diabetes   Formerly worked at [employer] 1 ppd; no alcohol. 2 step sons; no biologic children.       Social Determinants of Radio broadcast assistant Strain: Not on file  Food Insecurity: No Food Insecurity   Worried About Charity fundraiser in the Last Year: Never true   Arboriculturist in the Last Year: Never true  Transportation Needs: Not on file  Physical Activity: Not on file  Stress: Not on file  Social Connections: Not on file  Intimate Partner Violence: Not on file    Family History  Problem Relation Age of Onset   Venous thrombosis Brother    Other Brother        BRAIN TUMOR   Asthma Father     Diabetes Father    Coronary artery disease Mother    Hypertension Mother    Diabetes Mother    Breast cancer Mother 40   Asthma Sister  Diabetes type II Brother    Diabetes Brother    Allergies  Allergen Reactions   Adhesive [Tape] Rash and Other (See Comments)    TAKES OFF THE SKIN (CERTAIN MEDICAL TAPES DO THIS!!)   Metoprolol Shortness Of Breath    Occurrence of shortness of breath after 3 days   Montelukast Shortness Of Breath   Morphine Sulfate Anaphylaxis, Shortness Of Breath and Nausea And Vomiting    Swollen Throat - Able to tolerate dilaudid   Penicillins Anaphylaxis, Hives and Shortness Of Breath    Throat swells Has patient had a PCN reaction causing immediate rash, facial/tongue/throat swelling, SOB or lightheadedness with hypotension: Yes Has patient had a PCN reaction causing severe rash involving mucus membranes or skin necrosis: No Has patient had a PCN reaction that required hospitalization: Yes Has patient had a PCN reaction occurring within the last 10 years: No If all of the above answers are "NO", then may proceed with Cephalosporin use.    Silicone Rash    Takes off the skin (certain medical tapes do this)   Diltiazem Swelling   Doxycycline Hives   Gabapentin Swelling   Midodrine     Lightheaded and falling down   Percocet [Oxycodone-Acetaminophen] Rash   I? Current Facility-Administered Medications  Medication Dose Route Frequency Provider Last Rate Last Admin   acetaminophen (TYLENOL) tablet 650 mg  650 mg Oral Q6H PRN Cox, Amy N, DO       Or   acetaminophen (TYLENOL) suppository 650 mg  650 mg Rectal Q6H PRN Cox, Amy N, DO       albuterol (PROVENTIL) (2.5 MG/3ML) 0.083% nebulizer solution 3 mL  3 mL Nebulization Q6H PRN Cox, Amy N, DO       ALPRAZolam (XANAX) tablet 1 mg  1 mg Oral QID PRN Cox, Amy N, DO       busPIRone (BUSPAR) tablet 30 mg  30 mg Oral BID Cox, Amy N, DO       ceFEPIme (MAXIPIME) 2 g in sodium chloride 0.9 % 100 mL IVPB  2 g  Intravenous Q8H Cox, Amy N, DO       [START ON 12/10/2021] citalopram (CELEXA) tablet 20 mg  20 mg Oral Daily Cox, Amy N, DO       heparin injection 5,000 Units  5,000 Units Subcutaneous Q8H Cox, Amy N, DO       insulin aspart (novoLOG) injection 0-20 Units  0-20 Units Subcutaneous TID WC Cox, Amy N, DO   4 Units at 12/09/21 1631   insulin aspart (novoLOG) injection 0-5 Units  0-5 Units Subcutaneous QHS Cox, Amy N, DO       insulin glargine-yfgn (SEMGLEE) injection 35 Units  35 Units Subcutaneous QHS Cox, Amy N, DO       metroNIDAZOLE (FLAGYL) IVPB 500 mg  500 mg Intravenous Q12H Cox, Amy N, DO       nicotine (NICODERM CQ - dosed in mg/24 hours) patch 21 mg  21 mg Transdermal Daily PRN Cox, Amy N, DO       ondansetron (ZOFRAN) tablet 4 mg  4 mg Oral Q6H PRN Cox, Amy N, DO       Or   ondansetron (ZOFRAN) injection 4 mg  4 mg Intravenous Q6H PRN Cox, Amy N, DO       oxyCODONE (Oxy IR/ROXICODONE) immediate release tablet 5 mg  5 mg Oral Q6H PRN Cox, Amy N, DO   5 mg at 12/09/21 1439   polyethylene glycol (MIRALAX /  GLYCOLAX) packet 17 g  17 g Oral Daily PRN Cox, Amy N, DO       rOPINIRole (REQUIP) tablet 1 mg  1 mg Oral QHS PRN Cox, Amy N, DO       rosuvastatin (CRESTOR) tablet 10 mg  10 mg Oral QHS Cox, Amy N, DO       spironolactone (ALDACTONE) tablet 50 mg  50 mg Oral Daily Cox, Amy N, DO   50 mg at 12/09/21 1631   topiramate (TOPAMAX) tablet 50 mg  50 mg Oral BID Cox, Amy N, DO       traZODone (DESYREL) tablet 200 mg  200 mg Oral QHS PRN Cox, Amy N, DO       [START ON 12/10/2021] vancomycin (VANCOREADY) IVPB 1750 mg/350 mL  1,750 mg Intravenous Q24H Cox, Amy N, DO       [START ON 12/10/2021] venlafaxine XR (EFFEXOR-XR) 24 hr capsule 150 mg  150 mg Oral Q breakfast Cox, Amy N, DO         Abtx:  Anti-infectives (From admission, onward)    Start     Dose/Rate Route Frequency Ordered Stop   12/10/21 1600  vancomycin (VANCOREADY) IVPB 1750 mg/350 mL        1,750 mg 175 mL/hr over 120 Minutes  Intravenous Every 24 hours 12/09/21 1616     12/09/21 2200  metroNIDAZOLE (FLAGYL) IVPB 500 mg        500 mg 100 mL/hr over 60 Minutes Intravenous Every 12 hours 12/09/21 1556     12/09/21 2200  ceFEPIme (MAXIPIME) 2 g in sodium chloride 0.9 % 100 mL IVPB        2 g 200 mL/hr over 30 Minutes Intravenous Every 8 hours 12/09/21 1616     12/09/21 1400  vancomycin (VANCOREADY) IVPB 2000 mg/400 mL        2,000 mg 200 mL/hr over 120 Minutes Intravenous  Once 12/09/21 1350 12/09/21 1755   12/09/21 1400  ceFEPIme (MAXIPIME) 2 g in sodium chloride 0.9 % 100 mL IVPB        2 g 200 mL/hr over 30 Minutes Intravenous  Once 12/09/21 1352 12/09/21 1531   12/09/21 1400  metroNIDAZOLE (FLAGYL) tablet 500 mg        500 mg Oral  Once 12/09/21 1352 12/09/21 1435       REVIEW OF SYSTEMS:  Const:  fever, negative chills, negative weight loss Eyes: negative diplopia or visual changes, negative eye pain ENT: negative coryza, negative sore throat Resp: negative cough, hemoptysis, dyspnea Cards: negative for chest pain, palpitations, lower extremity edema GU: negative for frequency, dysuria and hematuria GI: Negative for abdominal pain, diarrhea, bleeding, constipation Skin: negative for rash and pruritus Heme: negative for easy bruising and gum/nose bleeding MS: Muscle weakness Pain left BKA site Neurolo:negative for headaches, dizziness, vertigo, memory problems  Psych:  anxiety, depression  Endocrine: , diabetes Allergy/Immunology-as above  Objective:  VITALS:  BP 130/62 (BP Location: Left Arm)   Pulse 77   Temp 97.8 F (36.6 C)   Resp 16   Wt 107.9 kg   LMP  (LMP Unknown)   SpO2 94%   BMI 40.83 kg/m   PHYSICAL EXAM:  General: Alert, cooperative, no distress, appears stated age.  Head: Normocephalic, without obvious abnormality, atraumatic. Eyes: Conjunctivae clear, anicteric sclerae. Pupils are equal ENT Nares normal. No drainage or sinus tenderness. Lips, mucosa, and tongue normal. No  Thrush Neck: Supple, symmetrical, no adenopathy, thyroid: non tender no carotid bruit  and no JVD. Back: No CVA tenderness. Lungs: Clear to auscultation bilaterally. No Wheezing or Rhonchi. No rales. Heart: Regular rate and rhythm, no murmur, rub or gallop. Abdomen: Soft, non-tender,not distended. Bowel sounds normal. No masses Extremities: Right BKA site staples present surgical site is well coapted No erythema or discharge Small scab present about the amputation site  Left BKA site wound VAC has been removed There is erythema around the open wound  Skin: No rashes or lesions. Or bruising Lymph: Cervical, supraclavicular normal. Neurologic: Grossly non-focal Pertinent Labs Lab Results CBC    Component Value Date/Time   WBC 6.7 12/09/2021 1323   RBC 3.33 (L) 12/09/2021 1323   HGB 8.1 (L) 12/09/2021 1323   HGB 12.2 09/16/2021 0847   HCT 27.5 (L) 12/09/2021 1323   HCT 38.0 09/16/2021 0847   PLT 231 12/09/2021 1323   PLT 200 09/16/2021 0847   MCV 82.6 12/09/2021 1323   MCV 86 09/16/2021 0847   MCH 24.3 (L) 12/09/2021 1323   MCHC 29.5 (L) 12/09/2021 1323   RDW 17.2 (H) 12/09/2021 1323   RDW 14.4 09/16/2021 0847   LYMPHSABS 0.9 11/04/2021 1524   LYMPHSABS 0.9 09/16/2021 0847   MONOABS 0.7 11/04/2021 1524   EOSABS 0.3 11/04/2021 1524   EOSABS 0.2 09/16/2021 0847   BASOSABS 0.1 11/04/2021 1524   BASOSABS 0.1 09/16/2021 0847       Latest Ref Rng & Units 12/09/2021    1:23 PM 11/20/2021    4:39 AM 11/17/2021   11:22 AM  CMP  Glucose 70 - 99 mg/dL 173   204   268    BUN 6 - 20 mg/dL 16   44   49    Creatinine 0.44 - 1.00 mg/dL 0.73   0.93   1.12    Sodium 135 - 145 mmol/L 136   137   132    Potassium 3.5 - 5.1 mmol/L 3.9   4.7   4.7    Chloride 98 - 111 mmol/L 105   107   103    CO2 22 - 32 mmol/L '23   26   23    '$ Calcium 8.9 - 10.3 mg/dL 8.6   8.4   8.2    Total Protein 6.5 - 8.1 g/dL 8.6    7.6    Total Bilirubin 0.3 - 1.2 mg/dL 1.1    0.5    Alkaline Phos 38 - 126 U/L  134    109    AST 15 - 41 U/L 11    19    ALT 0 - 44 U/L 7    13    Microbiology Blood culture sent on 12/09/2021 Wound culture sent on 12/09/2021   IMAGING RESULTS: No imaging   Impression/Recommendation Infected left BKA stump.  Recurrent she previously had debridement during the last hospitalization and the culture was Enterobacter was treated with 4 weeks of antibiotics. Patient will be seen by vascular Continue Vanco cefepime and Flagyl  Left BKA site looks clean  History of MRSA bacteremia and MRSA wounds has been treated  Diabetes mellitus on insulin  Severe deconditioning  CAP status post right mastectomy on anastrozole  anemia requiring blood transfusion in the past   Anxiety/depression on BuSpar, citalopram, desvenlafaxine and trazodone  Discussed the management with the patient and the care team ___________________________________________________ Discussed with patient, requesting provider Note:  This document was prepared using Dragon voice recognition software and may include unintentional dictation errors.

## 2021-12-09 NOTE — ED Triage Notes (Signed)
Pt has hx of amputation of bilateral lower ext. Pt went to wound care clinic yesterday to get dressings changed. Pt states she has had fevers at home and feels dizzy/weak

## 2021-12-09 NOTE — H&P (Signed)
History and Physical   [name] MPN:[id number] DOB: [birth year] DOA: 12/09/2021  PCP: Pleas Koch, NP  Outpatient Specialists: Dr. Delana Meyer, vascular Patient coming from: Home  I have personally briefly reviewed patient's old medical records in [place].  Chief Concern: Left lower extremity wound  HPI: Ms. [name] is a 51 year old female with history of hyperlipidemia, hypertension, depression, anxiety, nicotine dependence, insomnia, insulin-dependent diabetes mellitus, history of MRSA bacteremia, obesity, status post BKA (, who presents emergency department for chief concerns of redness and purulence of her right BKA stump.  Initial vitals in the emergency department showed temperature 98.5, respiration rate of 16, heart rate 79, blood pressure 137/70, SPO2 of 95% on room air.  Serum sodium is 136, potassium 3.9, chloride of 105, bicarb 23, BUN of 16, serum creatinine of 0.73, GFR greater than 60, nonfasting blood glucose 173, WBC 6.2, hemoglobin 8.1, platelets of 231.  Alk phos was elevated at 134, albumin 2.5, AST 11, ALT is 7.  Blood cultures x2 have been ordered and are in process per EDP.  EDP discussed with vascular who states they will see the patient.  Vascular has requested infectious disease to be consulted.  ED treatment: Cefepime, vancomycin, Flagyl IV  At bedside patient is able to tell me her name, her age, she knows she is in the hospital.  She reports that she had [facility] most recently on 11/15/2021 has been discharged home with a wound VAC in place.  She reports that over the last week her nursing home comes in 3 times a week to change the wound VAC states that the wound has been getting red with purulence.  She cannot see herself as she is blind.  She denies any nausea, vomiting, known fever at home.    She endorses positive for chills over the last few days.  She denies any fever, chest pain, worsening shortness of breath, abdominal pain, dysuria,  hematuria, diarrhea, dysphagia, cough.  Social history: She lives at home with her husband and son.  She currently smokes about half pack per day and does not feel ready or willing to quit.  She denies EtOH, recreational drug use.  ROS: Constitutional: no weight change, no fever ENT/Mouth: no sore throat, no rhinorrhea Eyes: no eye pain, no vision changes Cardiovascular: no chest pain, no dyspnea,  no edema, no palpitations Respiratory: no cough, no sputum, no wheezing Gastrointestinal: no nausea, no vomiting, no diarrhea, no constipation Genitourinary: no urinary incontinence, no dysuria, no hematuria Musculoskeletal: no arthralgias, no myalgias Skin: + Right lower extremity wound purulence and redness Neuro: + weakness, no loss of consciousness, no syncope Psych: no anxiety, no depression, + decrease appetite Heme/Lymph: no bruising, no bleeding  ED Course: Discussed with emergency medicine provider, patient requiring hospitalization for chief concerns this of poor left lower extremity wound healing.  Assessment/Plan  Principal Problem:   Leg wound, left, initial encounter Active Problems:   MRSA bacteremia   Uncontrolled type 2 diabetes mellitus with hyperglycemia, with long-term current use of insulin (HCC)   PVD (peripheral vascular disease) (HCC)   Tobacco abuse   Visual loss, bilateral   Breast cancer (HCC)   Elevated LFTs   Hx of BKA, right (HCC)   Assessment and Plan:  * Leg wound, left, initial encounter - Status post left BKA on 0/0/8676 complicated by wound dehiscence and abscess formation and is status post left wound vac application on 07/19/48 - Vascular has been consulted - Infectious disease has been consulted via  staff message, to Dr. Delaine Lame - ID has requested deep wound culture to be collected and I have discussed this request to vascular - Aerobic/anaerobic culture with Gram stain (surgical/deep wound) of the left leg has been ordered and pending  collection - Admit to [facility], observation  MRSA bacteremia - History of MRSA bacteremia - Patient was discharged home with PICC line on 10/14/2021 with vancomycin and completed antibiotic treatment for 4 weeks on 11/07/2021  Uncontrolled type 2 diabetes mellitus with hyperglycemia, with long-term current use of insulin ([place]) - Resumed home long-acting insulin 35 units nightly - Hemoglobin A1c on 09/21/2021 was 10.1 - Insulin SSI with at bedtime coverage ordered for obese/resistance dosing - Goal inpatient blood glucose levels 140-180  Hx of BKA, right ([place]) - Status post right BKA on 11/15/2021, the stump is healing appropriately and is due for suture removal outpatient  Elevated LFTs - Query secondary to acute on chronic wound infection - Liver function panel ordered in the a.m.  Breast cancer ([place]) - Status post mastectomy  Tobacco abuse - As needed nicotine patch has been ordered - I have discussed extensively with patient at bedside the risk of continued tobacco use including poor wound healing and need for further amputation in addition to CVA, MI, PAD, cancer risks - Patient states that she is not ready and currently unwilling to quit  Chart reviewed.   Hospitalization from 11/04/2021 and discharged on 11/20/2021: She was admitted for left BKA wound poor healing with purulent drainage and warmth and pain at surgical site and new wound on the right heel.  She was admitted for left lower extremity cellulitis with eschar on the left BKA stump and right heel gangrene.  She was treated with empiric IV antibiotic.  She underwent debridement of left BKA stump and right BKA amputation.  She developed acute blood loss anemia postoperatively and was transfused 5 units of PRBC.  She was evaluated by PT and OT, and was recommended further rehab in a skilled nursing facility.  Patient declined SNF and insisted on going home with home health therapy.   Hospitalization from 10/07/2021 to 10/14/2021: Was  found to have MRSA bacteremia source presumed to be the left foot from nonhealing ulcer.  She was discharged with PICC line to complete vancomycin on 11/07/2021  DVT prophylaxis: Heparin 5000 units one-time dose Code Status: full code Diet: Heart healthy/carb modified; n.p.o. after midnight Family Communication: No Disposition Plan: Pending clinical course Consults called: Vascular, infectious disease Admission status: MedSurg, observation  Past Medical History:  Diagnosis Date   Anxiety    takes Prozac daily   Anxiety    Aortic valve calcification    Asthma    Advair and Spirva daily   Asthma    Bipolar disorder ([place])    Breast cancer, right breast ([place]) 12/23/2019   CAD (coronary artery disease)    a. LHC 11/2013 done for CP/fluid retention: mild disease in prox LAD, mild-mod disease in mRCA, EF 60% with normal LVEDP. b. Normal nuc 03/2016.   Cancer ([place])    Cellulitis and abscess of trunk 03/12/2020   CHF (congestive heart failure) (HCC)    Chronic diastolic CHF (congestive heart failure) (HCC)    Chronic heart failure with preserved ejection fraction ([place]) 11/16/2018   CKD (chronic kidney disease), stage II    COPD (chronic obstructive pulmonary disease) (HCC)    a. nocturnal O2.   COPD (chronic obstructive pulmonary disease) (HCC)    Coronary artery disease    Decreased  urine stream    Diabetes mellitus    Diabetes mellitus without complication (HCC)    Dyspnea    Elevated C-reactive protein (CRP) 01/29/2018   Elevated sedimentation rate 01/29/2018   Elevated serum hCG 01/19/2018   Family history of adverse reaction to anesthesia    mom gets nauseated   Foot pain, bilateral 05/22/2018   GERD (gastroesophageal reflux disease)    takes Pepcid daily   History of blood clots    left leg 3-57yr ago   Hyperlipidemia    Hypertension    Hypertriglyceridemia    Inguinal hernia, left 01/2015   Muscle spasm    Open wound of genital labia    Peripheral neuropathy    RBBB     [place]ures [place]    [place] 01/19/2018   Sinus tachycardia    a. persistent since 2009.   Smokers' cough ([place]    Status post mastectomy, right 01/14/2020   St[facility]   left sided weakness   TIA (transient ischemic attack)    Tobacco abuse    Vulvar abscess 01/23/2018   Past Surgical History:  Procedure Laterality Date   AMPUTATION Left 10/11/2021   Procedure: AMPUTATION BELOW KNEE;  Surgeon: DAlgernon Huxley MD;  [facility];  Service: General;  Laterality: Left;   AMPUTATION Right 11/15/2021   Procedure: BELOW KNEE AMPUTATION;  Surgeon: SKatha Cabal MD;  [facility];  Service: Vascular;  Laterality: Right;   APPLICATION OF WOUND VAC Right 01/29/2020   Procedure: APPLICATION OF WOUND VAC;  Surgeon: RRonny Bacon MD;  [facility];  Service: General;  Laterality: Right;   APPLICATION OF WOUND VAC Left 11/15/2021   Procedure: APPLICATION OF WOUND VAC;  Surgeon: SKatha Cabal MD;  [facility];  Service: Vascular;  Laterality: Left;   BREAST BIOPSY Right 11/06/2019   uKoreacore path pending venus clip   GRAFT APPLICATION  32/64/1583  Procedure: GRAFT APPLICATION;  Surgeon: EEdrick Kins DPM;  Location: [facility];  Service: Podiatry;;   HERNIA REPAIR Left    I & D EXTREMITY Left 11/05/2021   Procedure: IRRIGATION AND DEBRIDEMENT LEFT BELOW KNEE STUMP;  Surgeon: SKatha Cabal MD;  Location: [facility];  Service: Vascular;  Laterality: Left;   INCISION AND DRAINAGE Bilateral 09/21/2021   Procedure: INCISION AND DRAINAGE;  Surgeon: EEdrick Kins DPM;  Location: [facility];  Service: Podiatry;  Laterality: Bilateral;   INCISION AND DRAINAGE ABSCESS N/A 01/26/2018   Procedure: INCISION AND DEBRIDEMENT OF VULVAR NECROTIZING SOFT TISSUE INFECTION;  Surgeon: WGreer Pickerel MD;  [place] [facility]  Service: General;  Laterality: N/A;   INCISION AND DRAINAGE ABSCESS N/A 07/21/2021   Procedure: INCISION AND DRAINAGE ABSCESS Scalp;  Surgeon: BRobert Bellow  MD;  [facility];  Service: General;  Laterality: N/A;  scalp and right abdomen   INCISION AND DRAINAGE PERIRECTAL ABSCESS N/A 01/22/2018   Procedure: IRRIGATION AND DEBRIDEMENT LABIAL/VULVAR AREA;  Surgeon: BCoralie Keens MD;  [place] [facility]  Service: General;  Laterality: N/A;   INCISION AND DRAINAGE PERIRECTAL ABSCESS N/A 01/29/2018   Procedure: IRRIGATION AND DEBRIDEMENT VULVA;  Surgeon: HExcell Seltzer MD;  [place] [facility]  Service: General;  Laterality: N/A;   INGUINAL HERNIA REPAIR Left 04/08/2015   Procedure: OPEN LEFT INGUINAL HERNIA REPAIR WITH MESH;  Surgeon: ARalene Ok MD;  [facility]  Service: General;  Laterality: Left;   INSERTION OF MESH Left 04/08/2015   Procedure: INSERTION OF MESH;  Surgeon: Ralene Ok, MD;  Location: [place];  Service: General;  Laterality: Left;   IRRIGATION AND DEBRIDEMENT ABSCESS N/A 07/21/2021   Procedure: IRRIGATION AND DEBRIDEMENT ABSCESS abdomen;  Surgeon: Robert Bellow, MD;  [facility];  Service: General;  Laterality: N/A;   LAPAROSCOPY     Endometriosis   LEFT HEART CATHETERIZATION WITH CORONARY ANGIOGRAM N/A 12/05/2013   Procedure: LEFT HEART CATHETERIZATION WITH CORONARY ANGIOGRAM;  Surgeon: Jettie Booze, MD;  Location: [facility] C[facility] LAB;  Service: Cardiovascular;  Laterality: N/A;   LOWER EXTREMITY ANGIOGRAPHY Left 09/22/2021   Procedure: Lower Extremity Angiography;  Surgeon: Katha Cabal, MD;  Location: [place] CV LAB;  Service: Cardiovascular;  Laterality: Left;   LOWER EXTREMITY ANGIOGRAPHY Right 11/09/2021   Procedure: Lower Extremity Angiography;  Surgeon: Katha Cabal, MD;  Location: [facility] CV LAB;  Service: Cardiovascular;  Laterality: Right;   MASTECTOMY Right    MINOR APPLICATION OF WOUND VAC Left 11/05/2021   Procedure: MINOR APPLICATION OF WOUND VAC;  Surgeon: Katha Cabal, MD;  Location: [facility];  Service: Vascular;  Laterality: Left;   right kidney drained      SIMPLE MASTECTOMY WITH AXILLARY SENTINEL NODE BIOPSY Right 12/23/2019   Procedure: Right SIMPLE MASTECTOMY WITH AXILLARY SENTINEL NODE BIOPSY;  Surgeon: Ronny Bacon, MD;  Location: [facility];  Service: General;  Laterality: Right;   TEE WITHOUT CARDIOVERSION N/A 11/28/2013   Procedure: TRANSESOPHAGEAL ECHOCARDIOGRAM (TEE);  Surgeon: Thayer Headings, MD;  Location: [place];  Service: Cardiovascular;  Laterality: N/A;   TEE WITHOUT CARDIOVERSION N/A 10/13/2021   Procedure: TRANSESOPHAGEAL ECHOCARDIOGRAM (TEE);  Surgeon: Corey Skains, MD;  Location: [facility];  Service: Cardiovascular;  Laterality: N/A;   WOUND DEBRIDEMENT Right 01/29/2020   Procedure: DEBRIDEMENT WOUND, Excisional debridement skin, subcutaneous and muscle right chest wall;  Surgeon: Ronny Bacon, MD;  Location: [facility];  Service: General;  Laterality: Right;    Social History:  reports that she has been smoking cigarettes. She started smoking about 40 years ago. She has a 9.00 pack-year smoking history. She has never used smokeless tobacco. She reports that she does not drink alcohol and does not use drugs.  Allergies  Allergen Reactions   Adhesive [Tape] Rash and Other (See Comments)    TAKES OFF THE SKIN (CERTAIN MEDICAL TAPES DO THIS!!)   Metoprolol Shortness Of Breath    Occurrence of shortness of breath after 3 days   Montelukast Shortness Of Breath   Morphine Sulfate Anaphylaxis, Shortness Of Breath and Nausea And Vomiting    Swollen Throat - Able to tolerate dilaudid   Penicillins Anaphylaxis, Hives and Shortness Of Breath    Throat swells Has patient had a PCN reaction causing immediate rash, facial/tongue/throat swelling, SOB or lightheadedness with hypotension: Yes Has patient had a PCN reaction causing severe rash involving mucus membranes or skin necrosis: No Has patient had a PCN reaction that required hospitalization: Yes Has patient had a PCN reaction occurring within the last 10 years: No If all  of the above answers are "NO", then may proceed with Cephalosporin use.    Silicone Rash    Takes off the skin (certain medical tapes do this)   Diltiazem Swelling   Doxycycline Hives   Gabapentin Swelling   Midodrine     Lightheaded and falling down   Percocet [Oxycodone-Acetaminophen] Rash   Family History  Problem Relation Age of Onset   Venous thrombosis Brother    Other Brother  BRAIN TUMOR   Asthma Father    Diabetes Father    Coronary artery disease Mother    Hypertension Mother    Diabetes Mother    Breast cancer Mother 38   Asthma Sister    Diabetes type II Brother    Diabetes Brother    Family history: Family history reviewed and not pertinent  Prior to Admission medications   Medication Sig Start Date End Date Taking? Authorizing Provider  acetaminophen (TYLENOL) 325 MG tablet Take 2 tablets (650 mg total) by mouth every 4 (four) hours as needed for headache or mild pain. 09/23/21   Dwyane Dee, MD  albuterol (VENTOLIN HFA) 108 (90 Base) MCG/ACT inhaler Inhale 2 puffs into the lungs every 6 (six) hours as needed for wheezing or shortness of breath.    [provider]  ALPRAZolam Duanne Moron) 1 MG tablet Take 1 tablet (1 mg total) by mouth 4 (four) times daily as needed for anxiety. 10/14/21   [name], MD  anastrozole (ARIMIDEX) 1 MG tablet Take 1 tablet (1 mg total) by mouth daily. Patient not taking: Reported on 11/04/2021 03/02/21   Cammie Sickle, MD  aspirin EC 81 MG tablet Take 81 mg by mouth daily before breakfast.    [provider]  blood glucose meter kit and supplies Use up to four times daily as directed. (FOR ICD-10 E10.9, E11.9). 02/22/21   Pleas Koch, NP  budesonide-formoterol (SYMBICORT) 80-4.5 MCG/ACT inhaler INHALE 2 PUFFS BY MOUTH EVERY 12 HOURS TO PREVENT COUGH OR WHEEZING *RINSE MOUTH AFTER EACH USE* 12/01/20   Pleas Koch, NP  busPIRone (BUSPAR) 30 MG tablet Take 30 mg by mouth 2 (two) times daily.     [provider]  citalopram (CELEXA) 20 MG tablet Take 1 tablet (20 mg total) by mouth daily. 12/17/19   Cammie Sickle, MD  desvenlafaxine (PRISTIQ) 100 MG 24 hr tablet Take 100 mg by mouth daily.    [provider]  EYLEA 2 MG/0.05ML SOSY  03/10/21   [provider]  glucose blood test strip Use as instructed 12/25/20   Pleas Koch, NP  insulin aspart (NOVOLOG) 100 UNIT/ML FlexPen Inject 1-9 Units into the skin 4 (four) times daily -  before meals and at bedtime. Glucose 121 - 150: 1 unit, Glucose 151 - 200: 2 units, Glucose 201 - 250: 3 units, Glucose 251 - 300: 5 units, Glucose 301 - 350: 7 units, Glucose 351 - 400: 9 units, Glucose > 400 call MD 09/23/21   Dwyane Dee, MD  insulin glargine (LANTUS) 100 UNIT/ML Solostar Pen Inject 35 Units into the skin daily. 10/14/21   [name], MD  Insulin Pen Needle 32G X 4 MM MISC Inject 1 each into the skin 4 (four) times daily -  before meals and at bedtime. Used to give insulin injections twice daily. 09/23/21   Dwyane Dee, MD  nicotine (NICODERM CQ - DOSED IN MG/24 HOURS) 14 mg/24hr patch Place 1 patch (14 mg total) onto the skin daily. 10/15/21   [name], MD  ondansetron (ZOFRAN) 8 MG tablet One pill every 8 hours as needed for nausea/vomitting. 09/10/21   Cammie Sickle, MD  oxyCODONE (OXY IR/ROXICODONE) 5 MG immediate release tablet Take 1 tablet (5 mg total) by mouth every 6 (six) hours as needed for severe pain. 12/02/21   Schnier, Dolores Lory, MD  OXYGEN Inhale 2-4 L into the lungs 2 (two) times daily as needed (shortness of breath).  [provider]  pregabalin (LYRICA) 300 MG capsule TAKE 1 CAPSULE BY MOUTH TWICE DAILY FOR  NERVE  PAIN 10/22/21   Pleas Koch, NP  ReliOn Ultra Thin Lancets 30G MISC Use as directed for blood sugar checks. 12/25/20   Pleas Koch, NP  rOPINIRole (REQUIP) 1 MG tablet TAKE 1 TABLET BY MOUTH EVERY DAY AT BEDTIME FOR  RESTLESS  LEG 08/16/21   Pleas Koch, NP  rosuvastatin (CRESTOR) 10 MG tablet Take 1 tablet (10 mg total) by mouth at bedtime. 10/14/21   [name], MD  spironolactone (ALDACTONE) 50 MG tablet Take 50 mg by mouth daily. 02/16/21   [provider]  topiramate (TOPAMAX) 50 MG tablet TAKE 1 TABLET BY MOUTH TWICE DAILY FOR HEADACHE PREVENTION 12/03/21   Pleas Koch, NP  traZODone (DESYREL) 100 MG tablet Take 200 mg by mouth at bedtime. 11/08/18   [provider]    Physical Exam: Vitals:   12/09/21 1315 12/09/21 1500 12/09/21 1556 12/09/21 1558  BP:  117/62 (!) 145/65   Pulse: 79 77 75 76  Resp: 16 15  18   Temp:      TempSrc:      SpO2: 95% 100% 97% 96%  Weight:       Constitutional: appears older than chronological age, frail, chronically ill, NAD, calm Eyes: PERRL, lids and conjunctivae normal ENMT: Mucous membranes are moist. Posterior pharynx clear of any exudate or lesions. Age-appropriate dentition. Hearing appropriate Neck: normal, supple, no masses, no thyromegaly Respiratory: clear to auscultation bilaterally, no wheezing, no crackles. Normal respiratory effort. No accessory muscle use.  Cardiovascular: Regular rate and rhythm, no murmurs / rubs / gallops. No extremity edema. 2+ pedal pulses. No carotid bruits.  Abdomen: Morbidly obese abdomen, no tenderness, no masses palpated, no hepatosplenomegaly. Bowel sounds positive.  Musculoskeletal: no clubbing / cyanosis. No joint deformity upper and lower extremities. Good ROM, no contractures, no atrophy. Normal muscle tone.  Skin: Right lower extremity stump wound with wound VAC in place appears red and warm    Neurologic: Sensation intact. Strength 5/5 in all 4.  Psychiatric: Normal judgment and insight. Alert and oriented x 3. Normal mood.   EKG: independently reviewed, showing sinus rhythm with rate of 77, right bundle branch block, QTc 466  Chest x-ray on Admission: Not indicated at this time  Labs on Admission: I have  personally reviewed following labs  CBC: Recent Labs  Lab 12/09/21 1323  WBC 6.7  HGB 8.1*  HCT 27.5*  MCV 82.6  PLT 270   Basic Metabolic Panel: Recent Labs  Lab 12/09/21 1323  NA 136  K 3.9  CL 105  CO2 23  GLUCOSE 173*  BUN 16  CREATININE 0.73  CALCIUM 8.6*   GFR: Estimated Creatinine Clearance: 98.7 mL/min (by C-G formula based on SCr of 0.73 mg/dL).  Liver Function Tests: Recent Labs  Lab 12/09/21 1323  AST 11*  ALT 7  ALKPHOS 134*  BILITOT 1.1  PROT 8.6*  ALBUMIN 2.5*   CBG: Recent Labs  Lab 12/09/21 1617  GLUCAP 157*   Urine analysis:    Component Value Date/Time   COLORURINE YELLOW (A) 11/04/2021 1603   APPEARANCEUR CLEAR (A) 11/04/2021 1603   LABSPEC 1.016 11/04/2021 1603   PHURINE 5.0 11/04/2021 1603   GLUCOSEU >=500 (A) 11/04/2021 1603   HGBUR SMALL (A) 11/04/2021 1603   HGBUR negative 03/07/2008 [place] 11/04/2021 1603   BILIRUBINUR neg 04/07/2021 0837   KETONESUR  NEGATIVE 11/04/2021 1603   PROTEINUR 100 (A) 11/04/2021 1603   UROBILINOGEN 0.2 04/07/2021 0837   UROBILINOGEN 0.2 10/08/2011 1850   NITRITE NEGATIVE 11/04/2021 1603   LEUKOCYTESUR NEGATIVE 11/04/2021 1603   Dr. Tobie Poet [facility]  If 7PM-7AM, please contact overnight-coverage provider If 7AM-7PM, please contact day coverage provider [email or web address]  12/09/2021, 4:25 PM

## 2021-12-09 NOTE — Assessment & Plan Note (Signed)
-   Status post mastectomy

## 2021-12-09 NOTE — ED Provider Notes (Signed)
[facility] Provider Note    Event Date/Time   First MD Initiated Contact with Patient 12/09/21 1327     (approximate)   History   Wound infection   HPI  [name] is a 51 y.o. female with past medical history of diabetes, hypertension, bipolar disorder who presents for increased swelling pain redness and foul odor from left BKA surgical site.  Review of medical records demonstrates the patient had amputation performed on Dec 02, 2021 with Dr. Delana Meyer of vascular surgery.  Wound care nurse noted yesterday foul odor.  Referred to the emergency department today.  Patient reports she has had subjective fevers.     Physical Exam   Triage Vital Signs: ED Triage Vitals  Enc Vitals Group     BP 12/09/21 1305 137/70     Pulse Rate 12/09/21 1305 78     Resp 12/09/21 1306 14     Temp 12/09/21 1306 98.5 F (36.9 C)     Temp Source 12/09/21 1306 Oral     SpO2 12/09/21 1305 93 %     Weight 12/09/21 1308 107.9 kg (237 lb 14 oz)     Height --      Head Circumference --      Peak Flow --      Pain Score --      Pain Loc --      Pain Edu? --      Excl. in [place]? --     Most recent vital signs: Vitals:   12/09/21 1306 12/09/21 1315  BP:    Pulse: 78 79  Resp: 14 16  Temp: 98.5 F (36.9 C)   SpO2: 95% 95%     General: Awake, no distress.  CV:  Good peripheral perfusion.  Resp:  Normal effort.  Abd:  No distention.  Other:  Left BKA stump, significant erythema swelling, foul odor.   ED Results / Procedures / Treatments   Labs (all labs ordered are listed, but only abnormal results are displayed) Labs Reviewed  CBC - Abnormal; Notable for the following components:      Result Value   RBC 3.33 (*)    Hemoglobin 8.1 (*)    HCT 27.5 (*)    MCH 24.3 (*)    MCHC 29.5 (*)    RDW 17.2 (*)    All other components within normal limits  COMPREHENSIVE METABOLIC PANEL - Abnormal; Notable for the following components:   Glucose, Bld 173 (*)     Calcium 8.6 (*)    Total Protein 8.6 (*)    Albumin 2.5 (*)    AST 11 (*)    Alkaline Phosphatase 134 (*)    All other components within normal limits  CULTURE, BLOOD (ROUTINE X 2)  CULTURE, BLOOD (ROUTINE X 2)  AEROBIC/ANAEROBIC CULTURE W GRAM STAIN (SURGICAL/DEEP WOUND)  LACTIC ACID, PLASMA  LACTIC ACID, PLASMA     EKG  ED ECG REPORT I, Lavonia Drafts, the attending physician, personally viewed and interpreted this ECG.  Date: 12/09/2021  Rhythm: normal sinus rhythm QRS Axis: normal Intervals: Right bundle branch block ST/T Wave abnormalities: normal Narrative Interpretation: no evidence of acute ischemia    RADIOLOGY     PROCEDURES:  Critical Care performed:   Procedures   MEDICATIONS ORDERED IN ED: Medications  vancomycin (VANCOREADY) IVPB 2000 mg/400 mL (has no administration in time range)  ceFEPIme (MAXIPIME) 2 g in sodium chloride 0.9 % 100 mL IVPB (has no administration in time range)  metroNIDAZOLE (FLAGYL) tablet 500 mg (has no administration in time range)  oxyCODONE (Oxy IR/ROXICODONE) immediate release tablet 5 mg (has no administration in time range)  rosuvastatin (CRESTOR) tablet 10 mg (has no administration in time range)  ALPRAZolam (XANAX) tablet 1 mg (has no administration in time range)  busPIRone (BUSPAR) tablet 30 mg (has no administration in time range)  nicotine (NICODERM CQ - dosed in mg/24 hours) patch 21 mg (has no administration in time range)  traZODone (DESYREL) tablet 200 mg (has no administration in time range)  rOPINIRole (REQUIP) tablet 1 mg (has no administration in time range)  albuterol (PROVENTIL) (2.5 MG/3ML) 0.083% nebulizer solution 3 mL (has no administration in time range)  acetaminophen (TYLENOL) tablet 650 mg (has no administration in time range)    Or  acetaminophen (TYLENOL) suppository 650 mg (has no administration in time range)  ondansetron (ZOFRAN) tablet 4 mg (has no administration in time range)    Or   ondansetron (ZOFRAN) injection 4 mg (has no administration in time range)  heparin injection 5,000 Units (has no administration in time range)  polyethylene glycol (MIRALAX / GLYCOLAX) packet 17 g (has no administration in time range)     IMPRESSION / MDM / ASSESSMENT AND PLAN / ED COURSE  I reviewed the triage vital signs and the nursing notes. Patient's presentation is most consistent with acute illness / injury with system symptoms.  Patient presents with redness swelling foul odor from left surgical site consistent with wound infection  Her lab work is reassuring, white blood cell count is normal, she has chronic anemia.  Lactic acid is normal, BMP unremarkable  Discussed with vascular surgery service, they recommend admission for IV antibiotics, they will consult on the patient  I have discussed with the hospitalist service for admission        FINAL CLINICAL IMPRESSION(S) / ED DIAGNOSES   Final diagnoses:  Wound infection     Rx / DC Orders   ED Discharge Orders     None        Note:  This document was prepared using Dragon voice recognition software and may include unintentional dictation errors.   Lavonia Drafts, MD 12/09/21 1423

## 2021-12-09 NOTE — Consult Note (Signed)
Pharmacy Antibiotic Note  [name] is a 51 y.o. female admitted on 12/09/2021 with increased swelling, pain, and redness from left BKA surgical site. History of right AKA and L BKA 10/11/21 c/b infection of L BKA stump for which she was admitted 4/27 - 5/13. Polymicrobial infection with MRSA, Klebsiella, and Enterobacter cloacae. Treated with IV antibiotics including ertapenem and daptomycin. Discharged on ciprofloxacin until 5/21. Also recent MRSA bacteremia and completed IV vancomycin x 6 weeks on 11/07/21 Pharmacy has been consulted for Vancomycin dosing.  Plan: Vancomycin 2000 mg IV x 1 as loading dose, followed by 1750 mg Q 24 hrs. Goal AUC 400-550. Expected AUC: 512 Expected Cssmin: 10.3 SCr used: 0.8(actual 0.73), Vd used: 0.5   Weight: 107.9 kg (237 lb 14 oz)  Temp (24hrs), Avg:98.5 F (36.9 C), Min:98.5 F (36.9 C), Max:98.5 F (36.9 C)  Recent Labs  Lab 12/09/21 1323  WBC 6.7  CREATININE 0.73  LATICACIDVEN 0.9    Estimated Creatinine Clearance: 98.7 mL/min (by C-G formula based on SCr of 0.73 mg/dL).    Allergies  Allergen Reactions   Adhesive [Tape] Rash and Other (See Comments)    TAKES OFF THE SKIN (CERTAIN MEDICAL TAPES DO THIS!!)   Metoprolol Shortness Of Breath    Occurrence of shortness of breath after 3 days   Montelukast Shortness Of Breath   Morphine Sulfate Anaphylaxis, Shortness Of Breath and Nausea And Vomiting    Swollen Throat - Able to tolerate dilaudid   Penicillins Anaphylaxis, Hives and Shortness Of Breath    Throat swells Has patient had a PCN reaction causing immediate rash, facial/tongue/throat swelling, SOB or lightheadedness with hypotension: Yes Has patient had a PCN reaction causing severe rash involving mucus membranes or skin necrosis: No Has patient had a PCN reaction that required hospitalization: Yes Has patient had a PCN reaction occurring within the last 10 years: No If all of the above answers are "NO", then may proceed with  Cephalosporin use.    Silicone Rash    Takes off the skin (certain medical tapes do this)   Diltiazem Swelling   Doxycycline Hives   Gabapentin Swelling   Midodrine     Lightheaded and falling down   Percocet [Oxycodone-Acetaminophen] Rash    Antimicrobials this admission: Vancomycin 6/1 >>  Rocephin 6/1 >>   Dose adjustments this admission: N/a  Microbiology results: 6/1 BCx: collected 6/1 Wound cx: collected    Thank you for allowing pharmacy to be a part of this patient's care.  Dollie Bressi A Jay Kempe 12/09/2021 4:20 PM

## 2021-12-09 NOTE — Hospital Course (Addendum)
Ms. [name] is a 51 year old female with history of hyperlipidemia, hypertension, depression, anxiety, nicotine dependence, insomnia, insulin-dependent diabetes mellitus, history of MRSA bacteremia, obesity, status post BKA (, who presents emergency department for chief concerns of redness and purulence of her right BKA stump.  Patient has had multiple hospitalization in the past few months. Patient was recently admitted in the hospital between 11/04/2021 until 11/20/2021 because of purulent discharge from the amputation site of left BKA.  She underwent revision of the amputation site.  There was also gangrene of the left foot with part of the calcaneal tuberosity avulsion.  She underwent right BKA.  Culture had Enterobacter in the left BKA site and Klebsiella, Enterobacter and MRSA in the right gangrene culture prior to her BKA. She received daptomycin till the day of discharge.  She also received ertapenem which was changed to ciprofloxacin p.o. twice daily for 2 more weeks on discharge.  She had received total of 4 weeks of antibiotics. Prior to this hospitalization she was admitted between 10/07/2021 until 10/14/2021 for MRSA bacteremia and MRSA wounds during on the left foot that hospitalization underwent BKA of the left foot.  She was treated with 6 weeks of IV antibiotics   Initial vitals in the emergency department showed temperature 98.5, respiration rate of 16, heart rate 79, blood pressure 137/70, SPO2 of 95% on room air.  Serum sodium is 136, potassium 3.9, chloride of 105, bicarb 23, BUN of 16, serum creatinine of 0.73, GFR greater than 60, nonfasting blood glucose 173, WBC 6.2, hemoglobin 8.1, platelets of 231.  Alk phos was elevated at 134, albumin 2.5, AST 11, ALT is 7.  Blood cultures x2 have been ordered and are in process per EDP.  EDP discussed with vascular who states they will see the patient.  Vascular has requested infectious disease to be consulted.  ED treatment: Cefepime,  vancomycin, Flagyl IV  6/2: Preliminary blood cultures negative, preliminary wound culture with rare gram-positive cocci. Vascular surgery removal of wound VAC and is planning to give her a wound VAC holiday before replacing in 1 to 2 days.  They really think that discharge is coming around the wound and there is no purulence noted.  Right stump staples to be removed by vascular surgery later today.  6/3 Plan for wound vac replacement by Vascular surgery. Wound continues to drain. Progressing

## 2021-12-09 NOTE — Assessment & Plan Note (Signed)
CBG mildly elevated. - Resumed home long-acting insulin 35 units nightly - Hemoglobin A1c on 09/21/2021 was 10.1 - Insulin SSI with at bedtime coverage ordered for obese/resistance dosing -Add 5 units for mealtime coverage - Goal inpatient blood glucose levels 140-180

## 2021-12-09 NOTE — Assessment & Plan Note (Signed)
-   Status post right BKA on 11/15/2021, the stump is healing appropriately and is due for suture removal outpatient

## 2021-12-09 NOTE — Consult Note (Signed)
PHARMACY -  BRIEF ANTIBIOTIC NOTE   Pharmacy has received consult(s) for vancomycin and cefepime from an ED provider. Patient is also ordered metronidazole. The patient's profile has been reviewed for ht/wt/allergies/indication/available labs.    Hx right AKA and L BKA 10/11/21 c/b infection of L BKA stump for which she was admitted 4/27 - 5/13. Polymicrobial infection with MRSA, Klebsiella, and Enterobacter cloacae. Treated with IV antibiotics including ertapenem and daptomycin. Discharged on ciprofloxacin until 5/21. Also recent MRSA bacteremia and completed IV vancomycin x 6 weeks on 11/07/21  One time order(s) placed for  --Vancomycin 2 g IV --Cefepime 2 g IV  Further antibiotics/pharmacy consults should be ordered by admitting physician if indicated.                       Thank you, Benita Gutter 12/09/2021  1:53 PM

## 2021-12-09 NOTE — Telephone Encounter (Signed)
Megan from [facility] that she visit the patient on yesterday. During the visit she notice that the patient has some maceration around the left amputation wound and odor. Home health nurse wanted to make our office aware with concerns.

## 2021-12-10 ENCOUNTER — Inpatient Hospital Stay: Payer: BC Managed Care – PPO | Admitting: Internal Medicine

## 2021-12-10 ENCOUNTER — Inpatient Hospital Stay: Payer: BC Managed Care – PPO | Attending: Internal Medicine

## 2021-12-10 ENCOUNTER — Inpatient Hospital Stay: Payer: BC Managed Care – PPO

## 2021-12-10 ENCOUNTER — Ambulatory Visit: Payer: Medicare Other | Admitting: Primary Care

## 2021-12-10 DIAGNOSIS — Z9011 Acquired absence of right breast and nipple: Secondary | ICD-10-CM | POA: Diagnosis not present

## 2021-12-10 DIAGNOSIS — S81802A Unspecified open wound, left lower leg, initial encounter: Secondary | ICD-10-CM | POA: Diagnosis not present

## 2021-12-10 DIAGNOSIS — E1169 Type 2 diabetes mellitus with other specified complication: Secondary | ICD-10-CM | POA: Diagnosis present

## 2021-12-10 DIAGNOSIS — Z720 Tobacco use: Secondary | ICD-10-CM | POA: Diagnosis not present

## 2021-12-10 DIAGNOSIS — Z89511 Acquired absence of right leg below knee: Secondary | ICD-10-CM | POA: Diagnosis not present

## 2021-12-10 DIAGNOSIS — A4902 Methicillin resistant Staphylococcus aureus infection, unspecified site: Secondary | ICD-10-CM

## 2021-12-10 DIAGNOSIS — Y835 Amputation of limb(s) as the cause of abnormal reaction of the patient, or of later complication, without mention of misadventure at the time of the procedure: Secondary | ICD-10-CM | POA: Diagnosis present

## 2021-12-10 DIAGNOSIS — F418 Other specified anxiety disorders: Secondary | ICD-10-CM | POA: Diagnosis not present

## 2021-12-10 DIAGNOSIS — E785 Hyperlipidemia, unspecified: Secondary | ICD-10-CM | POA: Diagnosis present

## 2021-12-10 DIAGNOSIS — E1142 Type 2 diabetes mellitus with diabetic polyneuropathy: Secondary | ICD-10-CM | POA: Diagnosis present

## 2021-12-10 DIAGNOSIS — Z66 Do not resuscitate: Secondary | ICD-10-CM | POA: Diagnosis present

## 2021-12-10 DIAGNOSIS — F319 Bipolar disorder, unspecified: Secondary | ICD-10-CM | POA: Diagnosis present

## 2021-12-10 DIAGNOSIS — F419 Anxiety disorder, unspecified: Secondary | ICD-10-CM | POA: Diagnosis present

## 2021-12-10 DIAGNOSIS — E1151 Type 2 diabetes mellitus with diabetic peripheral angiopathy without gangrene: Secondary | ICD-10-CM | POA: Diagnosis present

## 2021-12-10 DIAGNOSIS — F17210 Nicotine dependence, cigarettes, uncomplicated: Secondary | ICD-10-CM | POA: Diagnosis present

## 2021-12-10 DIAGNOSIS — T148XXA Other injury of unspecified body region, initial encounter: Secondary | ICD-10-CM | POA: Diagnosis present

## 2021-12-10 DIAGNOSIS — E1165 Type 2 diabetes mellitus with hyperglycemia: Secondary | ICD-10-CM | POA: Diagnosis present

## 2021-12-10 DIAGNOSIS — R7881 Bacteremia: Secondary | ICD-10-CM | POA: Diagnosis present

## 2021-12-10 DIAGNOSIS — Z6841 Body Mass Index (BMI) 40.0 and over, adult: Secondary | ICD-10-CM | POA: Diagnosis not present

## 2021-12-10 DIAGNOSIS — E669 Obesity, unspecified: Secondary | ICD-10-CM | POA: Diagnosis present

## 2021-12-10 DIAGNOSIS — T8744 Infection of amputation stump, left lower extremity: Secondary | ICD-10-CM

## 2021-12-10 DIAGNOSIS — L089 Local infection of the skin and subcutaneous tissue, unspecified: Secondary | ICD-10-CM | POA: Diagnosis not present

## 2021-12-10 DIAGNOSIS — B9562 Methicillin resistant Staphylococcus aureus infection as the cause of diseases classified elsewhere: Secondary | ICD-10-CM | POA: Diagnosis present

## 2021-12-10 DIAGNOSIS — G2581 Restless legs syndrome: Secondary | ICD-10-CM | POA: Diagnosis present

## 2021-12-10 DIAGNOSIS — Z89512 Acquired absence of left leg below knee: Secondary | ICD-10-CM | POA: Diagnosis not present

## 2021-12-10 DIAGNOSIS — Z9981 Dependence on supplemental oxygen: Secondary | ICD-10-CM | POA: Diagnosis not present

## 2021-12-10 DIAGNOSIS — E440 Moderate protein-calorie malnutrition: Secondary | ICD-10-CM | POA: Diagnosis present

## 2021-12-10 DIAGNOSIS — S81802D Unspecified open wound, left lower leg, subsequent encounter: Secondary | ICD-10-CM

## 2021-12-10 DIAGNOSIS — D649 Anemia, unspecified: Secondary | ICD-10-CM | POA: Diagnosis present

## 2021-12-10 DIAGNOSIS — I110 Hypertensive heart disease with heart failure: Secondary | ICD-10-CM | POA: Diagnosis present

## 2021-12-10 DIAGNOSIS — I10 Essential (primary) hypertension: Secondary | ICD-10-CM | POA: Diagnosis present

## 2021-12-10 DIAGNOSIS — Z794 Long term (current) use of insulin: Secondary | ICD-10-CM | POA: Diagnosis not present

## 2021-12-10 DIAGNOSIS — J449 Chronic obstructive pulmonary disease, unspecified: Secondary | ICD-10-CM | POA: Diagnosis present

## 2021-12-10 LAB — BASIC METABOLIC PANEL
Anion gap: 5 (ref 5–15)
BUN: 18 mg/dL (ref 6–20)
CO2: 25 mmol/L (ref 22–32)
Calcium: 8.4 mg/dL — ABNORMAL LOW (ref 8.9–10.3)
Chloride: 107 mmol/L (ref 98–111)
Creatinine, Ser: 0.75 mg/dL (ref 0.44–1.00)
GFR, Estimated: >60 mL/min (ref >60)
Glucose, Bld: 176 mg/dL — ABNORMAL HIGH (ref 70–99)
Potassium: 4 mmol/L (ref 3.5–5.1)
Sodium: 137 mmol/L (ref 135–145)

## 2021-12-10 LAB — HEPATIC FUNCTION PANEL
ALT: 7 U/L (ref 0–44)
AST: 8 U/L — ABNORMAL LOW (ref 15–41)
Albumin: 2.3 g/dL — ABNORMAL LOW (ref 3.5–5.0)
Alkaline Phosphatase: 121 U/L (ref 38–126)
Bilirubin, Direct: 0.3 mg/dL — ABNORMAL HIGH (ref 0.0–0.2)
Indirect Bilirubin: 0.5 mg/dL (ref 0.3–0.9)
Total Bilirubin: 0.8 mg/dL (ref 0.3–1.2)
Total Protein: 7.8 g/dL (ref 6.5–8.1)

## 2021-12-10 LAB — CBC
HCT: 25.8 % — ABNORMAL LOW (ref 36.0–46.0)
Hemoglobin: 7.7 g/dL — ABNORMAL LOW (ref 12.0–15.0)
MCH: 24.4 pg — ABNORMAL LOW (ref 26.0–34.0)
MCHC: 29.8 g/dL — ABNORMAL LOW (ref 30.0–36.0)
MCV: 81.9 fL (ref 80.0–100.0)
Platelets: 225 10*3/uL (ref 150–400)
RBC: 3.15 MIL/uL — ABNORMAL LOW (ref 3.87–5.11)
RDW: 17.2 % — ABNORMAL HIGH (ref 11.5–15.5)
WBC: 5 10*3/uL (ref 4.0–10.5)
nRBC: 0 % (ref 0.0–0.2)

## 2021-12-10 LAB — GLUCOSE, CAPILLARY
Glucose-Capillary: 174 mg/dL — ABNORMAL HIGH (ref 70–99)
Glucose-Capillary: 152 mg/dL — ABNORMAL HIGH (ref 70–99)
Glucose-Capillary: 120 mg/dL — ABNORMAL HIGH (ref 70–99)
Glucose-Capillary: 136 mg/dL — ABNORMAL HIGH (ref 70–99)

## 2021-12-10 MED ORDER — MEROPENEM 1 G IV SOLR
1.0000 g | Freq: Three times a day (TID) | INTRAVENOUS | Status: DC
  Administered 2021-12-11 (×2): 1 g via INTRAVENOUS
  Filled 2021-12-10: qty 20
  Filled 2021-12-10: qty 1
  Filled 2021-12-10: qty 20

## 2021-12-10 MED ORDER — INSULIN ASPART 100 UNIT/ML IJ SOLN
5.0000 [IU] | Freq: Three times a day (TID) | INTRAMUSCULAR | Status: DC
  Administered 2021-12-10 – 2021-12-11 (×3): 5 [IU] via SUBCUTANEOUS
  Filled 2021-12-10 (×2): qty 1

## 2021-12-10 NOTE — Progress Notes (Signed)
ID Pt is feeling better Says the left stump smells rotten egg Says both her mom and dad had b/l AKA O/e awake and alert Patient Vitals for the past 24 hrs:  BP Temp Pulse Resp SpO2  12/10/21 1635 (!) 141/71 (!) 97.5 F (36.4 C) 71 -- 98 %  12/10/21 1115 122/61 98.2 F (36.8 C) 69 17 100 %  12/10/21 0956 118/68 97.6 F (36.4 C) 68 -- 100 %  12/10/21 0359 111/67 97.7 F (36.5 C) 68 16 99 %  12/09/21 2028 130/62 97.8 F (36.6 C) 77 16 94 %    Chest cta Hss1s2 Abd soft Cns non focal Left stump BKA site had a wound vac which is removed Purulent green discharge     Rt bka site looks healthy  Labs    Latest Ref Rng & Units 12/10/2021    7:40 AM 12/09/2021    1:23 PM 11/20/2021    4:39 AM  CBC  WBC 4.0 - 10.5 K/uL 5.0   6.7   8.3    Hemoglobin 12.0 - 15.0 g/dL 7.7   8.1   7.6    Hematocrit 36.0 - 46.0 % 25.8   27.5   25.3    Platelets 150 - 400 K/uL 225   231   250         Latest Ref Rng & Units 12/10/2021    7:40 AM 12/09/2021    1:23 PM 11/20/2021    4:39 AM  CMP  Glucose 70 - 99 mg/dL 176   173   204    BUN 6 - 20 mg/dL 18   16   44    Creatinine 0.44 - 1.00 mg/dL 0.75   0.73   0.93    Sodium 135 - 145 mmol/L 137   136   137    Potassium 3.5 - 5.1 mmol/L 4.0   3.9   4.7    Chloride 98 - 111 mmol/L 107   105   107    CO2 22 - 32 mmol/L '25   23   26    '$ Calcium 8.9 - 10.3 mg/dL 8.4   8.6   8.4    Total Protein 6.5 - 8.1 g/dL 7.8   8.6     Total Bilirubin 0.3 - 1.2 mg/dL 0.8   1.1     Alkaline Phos 38 - 126 U/L 121   134     AST 15 - 41 U/L 8   11     ALT 0 - 44 U/L 7   7        Micro Left stump wound so far MRSA   Impression/recommendation Infected left BKA stump This is recurrent infection she has had devious debridement.  She had multiple infections in that leg She had MRSA, Enterobacter Has been treated for many weeks. This looks infected again and very likely she is going to need AKA Currently on vancomycin and cefepime and Flagyl We will change cefepime  and Flagyl to meropenem   Right BKA site is well approximated Diabetes mellitus on insulin  CA breast status post right mastectomy on anastrozole  Anemia requiring blood transfusion in the past  Diabetes mellitus on insulin  Discussed the management with the patient and her nurse.

## 2021-12-10 NOTE — Plan of Care (Signed)

## 2021-12-10 NOTE — Telephone Encounter (Signed)
Patient did not show for f/u today is in hospital at this time. Will address at follow up.
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
## 2021-12-10 NOTE — Consult Note (Signed)
[name] Vascular Consult Note  MRN : [id number]  [name] is a 51 y.o. (October 14, 1970) female who presents with chief complaint of  Chief Complaint  Patient presents with   Wound Check    Pt has hx of amputation of bilateral lower ext. Pt went to wound care clinic yesterday to get dressings changed. Pt states she has had fevers at home and feels dizzy/weak  .   Consulting Physician: Lavonia Drafts, MD Reason for consult: History of Present Illness: [name] is a 51 year old female with a previous history of diabetes mellitus, COPD and bilateral amputations.  Currently the patient has home health changing her wound VAC dressing 3 times per week.  She noted that what prompted her visit to the emergency room was that her wound site was red and there was a severe foul-smelling odor.  There is no noted purulent drainage.  On the patient's most recent hospitalization her left below-knee amputation was debrided due to abscess formation.  Today the site has good granulation tissue but does continue to have an odor.  The patient also underwent a right below-knee amputation due to gangrenous changes of her right foot.  This wound is clean dry and intact with staples still in place.  Current Facility-Administered Medications  Medication Dose Route Frequency Provider Last Rate Last Admin   acetaminophen (TYLENOL) tablet 650 mg  650 mg Oral Q6H PRN Cox, Amy N, DO       Or   acetaminophen (TYLENOL) suppository 650 mg  650 mg Rectal Q6H PRN Cox, Amy N, DO       albuterol (PROVENTIL) (2.5 MG/3ML) 0.083% nebulizer solution 3 mL  3 mL Nebulization Q6H PRN Cox, Amy N, DO       ALPRAZolam (XANAX) tablet 1 mg  1 mg Oral QID PRN Cox, Amy N, DO       busPIRone (BUSPAR) tablet 30 mg  30 mg Oral BID Cox, Amy N, DO   30 mg at 12/09/21 2205   ceFEPIme (MAXIPIME) 2 g in sodium chloride 0.9 % 100 mL IVPB  2 g Intravenous Q8H Cox, Amy N, DO 200 mL/hr at 12/09/21 2328 2 g at 12/09/21  2328   citalopram (CELEXA) tablet 20 mg  20 mg Oral Daily Cox, Amy N, DO       insulin aspart (novoLOG) injection 0-20 Units  0-20 Units Subcutaneous TID WC Cox, Amy N, DO   4 Units at 12/09/21 1631   insulin aspart (novoLOG) injection 0-5 Units  0-5 Units Subcutaneous QHS Cox, Amy N, DO       insulin glargine-yfgn (SEMGLEE) injection 35 Units  35 Units Subcutaneous QHS Cox, Amy N, DO   35 Units at 12/09/21 2306   metroNIDAZOLE (FLAGYL) IVPB 500 mg  500 mg Intravenous Q12H Cox, Amy N, DO 100 mL/hr at 12/09/21 2215 500 mg at 12/09/21 2215   nicotine (NICODERM CQ - dosed in mg/24 hours) patch 21 mg  21 mg Transdermal Daily PRN Cox, Amy N, DO       ondansetron (ZOFRAN) tablet 4 mg  4 mg Oral Q6H PRN Cox, Amy N, DO       Or   ondansetron (ZOFRAN) injection 4 mg  4 mg Intravenous Q6H PRN Cox, Amy N, DO       oxyCODONE (Oxy IR/ROXICODONE) immediate release tablet 5 mg  5 mg Oral Q6H PRN Cox, Amy N, DO   5 mg at 12/09/21 2205   polyethylene glycol (MIRALAX / GLYCOLAX)  packet 17 g  17 g Oral Daily PRN Cox, Amy N, DO       rOPINIRole (REQUIP) tablet 1 mg  1 mg Oral QHS PRN Cox, Amy N, DO       rosuvastatin (CRESTOR) tablet 10 mg  10 mg Oral QHS Cox, Amy N, DO   10 mg at 12/09/21 2205   spironolactone (ALDACTONE) tablet 50 mg  50 mg Oral Daily Cox, Amy N, DO   50 mg at 12/09/21 1631   topiramate (TOPAMAX) tablet 50 mg  50 mg Oral BID Cox, Amy N, DO   50 mg at 12/09/21 2205   traZODone (DESYREL) tablet 200 mg  200 mg Oral QHS PRN Cox, Amy N, DO   200 mg at 12/09/21 2205   vancomycin (VANCOREADY) IVPB 1750 mg/350 mL  1,750 mg Intravenous Q24H Cox, Amy N, DO       venlafaxine XR (EFFEXOR-XR) 24 hr capsule 150 mg  150 mg Oral Q breakfast Cox, Amy N, DO        Past Medical History:  Diagnosis Date   Anxiety    takes Prozac daily   Anxiety    Aortic valve calcification    Asthma    Advair and Spirva daily   Asthma    Bipolar disorder ([place])    Breast cancer, right breast ([place]) 12/23/2019   CAD  (coronary artery disease)    a. LHC 11/2013 done for CP/fluid retention: mild disease in prox LAD, mild-mod disease in mRCA, EF 60% with normal LVEDP. b. Normal nuc 03/2016.   Cancer ([place])    Cellulitis and abscess of trunk 03/12/2020   CHF (congestive heart failure) (HCC)    Chronic diastolic CHF (congestive heart failure) (HCC)    Chronic heart failure with preserved ejection fraction ([place]) 11/16/2018   CKD (chronic kidney disease), stage II    COPD (chronic obstructive pulmonary disease) (HCC)    a. nocturnal O2.   COPD (chronic obstructive pulmonary disease) (HCC)    Coronary artery disease    Decreased urine stream    Diabetes mellitus    Diabetes mellitus without complication (HCC)    Dyspnea    Elevated C-reactive protein (CRP) 01/29/2018   Elevated sedimentation rate 01/29/2018   Elevated serum hCG 01/19/2018   Family history of adverse reaction to anesthesia    mom gets nauseated   Foot pain, bilateral 05/22/2018   GERD (gastroesophageal reflux disease)    takes Pepcid daily   History of blood clots    left leg 3-34yr ago   Hyperlipidemia    Hypertension    Hypertriglyceridemia    Inguinal hernia, left 01/2015   Muscle spasm    Open wound of genital labia    Peripheral neuropathy    RBBB    Seizures ([place]    Sepsis ([place] 01/19/2018   Sinus tachycardia    a. persistent since 2009.   Smokers' cough ([place]    Status post mastectomy, right 01/14/2020   Stroke ([facility] 1989   left sided weakness   TIA (transient ischemic attack)    Tobacco abuse    Vulvar abscess 01/23/2018    Past Surgical History:  Procedure Laterality Date   AMPUTATION Left 10/11/2021   Procedure: AMPUTATION BELOW KNEE;  Surgeon: DAlgernon Huxley MD;  Location: [facility];  Service: General;  Laterality: Left;   AMPUTATION Right 11/15/2021   Procedure: BELOW KNEE AMPUTATION;  Surgeon: SKatha Cabal MD;  Location: [facility];  Service: Vascular;  Laterality: Right;  APPLICATION OF WOUND VAC Right 01/29/2020    Procedure: APPLICATION OF WOUND VAC;  Surgeon: Ronny Bacon, MD;  Location: [facility];  Service: General;  Laterality: Right;   APPLICATION OF WOUND VAC Left 11/15/2021   Procedure: APPLICATION OF WOUND VAC;  Surgeon: Katha Cabal, MD;  Location: [facility];  Service: Vascular;  Laterality: Left;   BREAST BIOPSY Right 11/06/2019   [place] core path pending venus clip   GRAFT APPLICATION  1/61/0960   Procedure: GRAFT APPLICATION;  Surgeon: [name], DPM;  Location: [facility];  Service: Podiatry;;   HERNIA REPAIR Left    I & D EXTREMITY Left 11/05/2021   Procedure: IRRIGATION AND DEBRIDEMENT LEFT BELOW KNEE STUMP;  Surgeon: Katha Cabal, MD;  Location: [facility];  Service: Vascular;  Laterality: Left;   INCISION AND DRAINAGE Bilateral 09/21/2021   Procedure: INCISION AND DRAINAGE;  Surgeon: [name], DPM;  Location: [facility];  Service: Podiatry;  Laterality: Bilateral;   INCISION AND DRAINAGE ABSCESS N/A 01/26/2018   Procedure: INCISION AND DEBRIDEMENT OF VULVAR NECROTIZING SOFT TISSUE INFECTION;  Surgeon: Greer Pickerel, MD;  Location: [place];  Service: General;  Laterality: N/A;   INCISION AND DRAINAGE ABSCESS N/A 07/21/2021   Procedure: INCISION AND DRAINAGE ABSCESS Scalp;  Surgeon: Robert Bellow, MD;  Location: [place] [facility];  Service: General;  Laterality: N/A;  scalp and right abdomen   INCISION AND DRAINAGE PERIRECTAL ABSCESS N/A 01/22/2018   Procedure: IRRIGATION AND DEBRIDEMENT LABIAL/VULVAR AREA;  Surgeon: Coralie Keens, MD;  Location: [place];  Service: General;  Laterality: N/A;   INCISION AND DRAINAGE PERIRECTAL ABSCESS N/A 01/29/2018   Procedure: IRRIGATION AND DEBRIDEMENT VULVA;  Surgeon: Excell Seltzer, MD;  Location: [place];  Service: General;  Laterality: N/A;   INGUINAL HERNIA REPAIR Left 04/08/2015   Procedure: OPEN LEFT INGUINAL HERNIA REPAIR WITH MESH;  Surgeon: Ralene Ok, MD;  Location: [place];  Service: General;  Laterality: Left;   INSERTION OF MESH Left  04/08/2015   Procedure: INSERTION OF MESH;  Surgeon: Ralene Ok, MD;  Location: [place];  Service: General;  Laterality: Left;   IRRIGATION AND DEBRIDEMENT ABSCESS N/A 07/21/2021   Procedure: IRRIGATION AND DEBRIDEMENT ABSCESS abdomen;  Surgeon: Robert Bellow, MD;  Location: [facility];  Service: General;  Laterality: N/A;   LAPAROSCOPY     Endometriosis   LEFT HEART CATHETERIZATION WITH CORONARY ANGIOGRAM N/A 12/05/2013   Procedure: LEFT HEART CATHETERIZATION WITH CORONARY ANGIOGRAM;  Surgeon: Jettie Booze, MD;  Location: [facility] C[facility];  Service: Cardiovascular;  Laterality: N/A;   LOWER EXTREMITY ANGIOGRAPHY Left 09/22/2021   Procedure: Lower Extremity Angiography;  Surgeon: Katha Cabal, MD;  Location: [place] CV LAB;  Service: Cardiovascular;  Laterality: Left;   LOWER EXTREMITY ANGIOGRAPHY Right 11/09/2021   Procedure: Lower Extremity Angiography;  Surgeon: Katha Cabal, MD;  Location: [place] CV LAB;  Service: Cardiovascular;  Laterality: Right;   MASTECTOMY Right    MINOR APPLICATION OF WOUND VAC Left 11/05/2021   Procedure: MINOR APPLICATION OF WOUND VAC;  Surgeon: Katha Cabal, MD;  Location: [facility];  Service: Vascular;  Laterality: Left;   right kidney drained     SIMPLE MASTECTOMY WITH AXILLARY SENTINEL NODE BIOPSY Right 12/23/2019   Procedure: Right SIMPLE MASTECTOMY WITH AXILLARY SENTINEL NODE BIOPSY;  Surgeon: Ronny Bacon, MD;  Location: [facility];  Service: General;  Laterality: Right;   TEE WITHOUT CARDIOVERSION N/A 11/28/2013   Procedure: TRANSESOPHAGEAL ECHOCARDIOGRAM (TEE);  Surgeon: Wonda Cheng  Nahser, MD;  Location: [place] [facility];  Service: Cardiovascular;  Laterality: N/A;   TEE WITHOUT CARDIOVERSION N/A 10/13/2021   Procedure: TRANSESOPHAGEAL ECHOCARDIOGRAM (TEE);  Surgeon: Corey Skains, MD;  Location: [facility];  Service: Cardiovascular;  Laterality: N/A;   WOUND DEBRIDEMENT Right 01/29/2020   Procedure: DEBRIDEMENT WOUND, Excisional  debridement skin, subcutaneous and muscle right chest wall;  Surgeon: Ronny Bacon, MD;  Location: [facility];  Service: General;  Laterality: Right;    Social History Social History   Tobacco Use   Smoking status: Every Day    Packs/day: 0.25    Years: 36.00    Pack years: 9.00    Types: Cigarettes    Start date: 07/11/1981   Smokeless tobacco: Never   Tobacco comments:    1 ppd now  Vaping Use   Vaping Use: Some days  Substance Use Topics   Alcohol use: No   Drug use: No    Family History Family History  Problem Relation Age of Onset   Venous thrombosis Brother    Other Brother        BRAIN TUMOR   Asthma Father    Diabetes Father    Coronary artery disease Mother    Hypertension Mother    Diabetes Mother    Breast cancer Mother 75   Asthma Sister    Diabetes type II Brother    Diabetes Brother     Allergies  Allergen Reactions   Adhesive [Tape] Rash and Other (See Comments)    TAKES OFF THE SKIN (CERTAIN MEDICAL TAPES DO THIS!!)   Metoprolol Shortness Of Breath    Occurrence of shortness of breath after 3 days   Montelukast Shortness Of Breath   Morphine Sulfate Anaphylaxis, Shortness Of Breath and Nausea And Vomiting    Swollen Throat - Able to tolerate dilaudid   Penicillins Anaphylaxis, Hives and Shortness Of Breath    Throat swells Has patient had a PCN reaction causing immediate rash, facial/tongue/throat swelling, SOB or lightheadedness with hypotension: Yes Has patient had a PCN reaction causing severe rash involving mucus membranes or skin necrosis: No Has patient had a PCN reaction that required hospitalization: Yes Has patient had a PCN reaction occurring within the last 10 years: No If all of the above answers are "NO", then may proceed with Cephalosporin use.    Silicone Rash    Takes off the skin (certain medical tapes do this)   Diltiazem Swelling   Doxycycline Hives   Gabapentin Swelling   Midodrine     Lightheaded and falling down    Percocet [Oxycodone-Acetaminophen] Rash     REVIEW OF SYSTEMS (Negative unless checked)  Constitutional: '[]'$ Weight loss  '[]'$ Fever  '[]'$ Chills Cardiac: '[]'$ Chest pain   '[]'$ Chest pressure   '[]'$ Palpitations   '[]'$ Shortness of breath when laying flat   '[]'$ Shortness of breath at rest   '[]'$ Shortness of breath with exertion. Vascular:  '[]'$ Pain in legs with walking   '[]'$ Pain in legs at rest   '[]'$ Pain in legs when laying flat   '[]'$ Claudication   '[]'$ Pain in feet when walking  '[]'$ Pain in feet at rest  '[]'$ Pain in feet when laying flat   '[]'$ History of DVT   '[]'$ Phlebitis   '[]'$ Swelling in legs   '[]'$ Varicose veins   '[]'$ Non-healing ulcers Pulmonary:   '[]'$ Uses home oxygen   '[]'$ Productive cough   '[]'$ Hemoptysis   '[]'$ Wheeze  '[]'$ COPD   '[]'$ Asthma Neurologic:  '[]'$ Dizziness  '[]'$ Blackouts   '[]'$ Seizures   '[]'$ History of stroke   '[]'$ History of TIA  '[]'$   Aphasia   '[]'$ Temporary blindness   '[]'$ Dysphagia   '[]'$ Weakness or numbness in arms   '[]'$ Weakness or numbness in legs Musculoskeletal:  '[]'$ Arthritis   '[]'$ Joint swelling   '[]'$ Joint pain   '[]'$ Low back pain Hematologic:  '[]'$ Easy bruising  '[]'$ Easy bleeding   '[]'$ Hypercoagulable state   '[]'$ Anemic  '[]'$ Hepatitis Gastrointestinal:  '[]'$ Blood in stool   '[]'$ Vomiting blood  '[]'$ Gastroesophageal reflux/heartburn   '[]'$ Difficulty swallowing. Genitourinary:  '[]'$ Chronic kidney disease   '[]'$ Difficult urination  '[]'$ Frequent urination  '[]'$ Burning with urination   '[]'$ Blood in urine Skin:  '[]'$ Rashes   '[]'$ Ulcers   '[x]'$ Wounds Psychological:  '[]'$ History of anxiety   '[]'$  History of major depression.  Physical Examination  Vitals:   12/09/21 1630 12/09/21 1733 12/09/21 1819 12/09/21 2028  BP: (!) 157/77 (!) 150/70 130/64 130/62  Pulse: 84 80 73 77  Resp: '19 18 16 16  '$ Temp: 98.2 F (36.8 C) 98 F (36.7 C) 98.5 F (36.9 C) 97.8 F (36.6 C)  TempSrc: Oral Oral    SpO2: 97% 98% 95% 94%  Weight:       Body mass index is 40.83 kg/m. Gen:  WD/WN, NAD Head: [place]/AT, No temporalis wasting. Prominent temp pulse not noted. Ear/Nose/Throat: Hearing grossly intact,  nares w/o erythema or drainage, oropharynx w/o Erythema/Exudate Eyes: Sclera non-icteric, conjunctiva clear Neck: Trachea midline.  No JVD.  Pulmonary:  Good air movement, respirations not labored, equal bilaterally.  Cardiac: RRR, normal S1, S2. Vascular: Bilateral below-knee amputations.  Periwound redness on the left below-knee amputation but the wound site is granulating well with good bleeding.  Right below-knee amputation site still has staples but it is clean dry and intact  Gastrointestinal: soft, non-tender/non-distended. No guarding/reflex.  Musculoskeletal: M/S 5/5 throughout.  Extremities without ischemic changes.  No deformity or atrophy. No edema. Neurologic: Sensation grossly intact in extremities.  Symmetrical.  Speech is fluent. Motor exam as listed above. Psychiatric: Judgment intact, Mood & affect appropriate for pt's clinical situation.  Lymph : No Cervical, Axillary, or Inguinal lymphadenopathy.    CBC Lab Results  Component Value Date   WBC 6.7 12/09/2021   HGB 8.1 (L) 12/09/2021   HCT 27.5 (L) 12/09/2021   MCV 82.6 12/09/2021   PLT 231 12/09/2021    BMET    Component Value Date/Time   NA 136 12/09/2021 1323   NA 137 01/08/2018 1117   K 3.9 12/09/2021 1323   CL 105 12/09/2021 1323   CO2 23 12/09/2021 1323   GLUCOSE 173 (H) 12/09/2021 1323   BUN 16 12/09/2021 1323   BUN 31 (H) 01/08/2018 1117   CREATININE 0.73 12/09/2021 1323   CREATININE 1.59 (H) 07/18/2019 1506   CALCIUM 8.6 (L) 12/09/2021 1323   GFRNONAA >60 12/09/2021 1323   GFRAA 51 (L) 02/25/2020 1504   Estimated Creatinine Clearance: 98.7 mL/min (by C-G formula based on SCr of 0.73 mg/dL).  COAG Lab Results  Component Value Date   INR 1.4 (H) 11/04/2021   INR 1.2 09/20/2021   INR 1.0 03/21/2019    Radiology No results found.    Assessment/Plan 1.  Left below-knee amputation  Today the wound does have an odor from the wound vacuum removal however much of the appears to be coming  from the periwound area underneath the tape and occlusive dressings.  I suspect there is some periwound cellulitis.  He culture was taken on the deep wound area but it appears to be granulating well.  There is a wound in the medial portion of her stump but it  does not appear to track inwards.  We will remove the wound VAC dressing today to allow the skin to have a break from the continued occlusive dressings.  2.  Right below-knee amputation Incision looks clean dry and intact we will attempt to remove some of her staples tomorrow   Family Communication: Husband at [place], NP [facility] [phone] (Office Phone) [phone] (Office Fax)  12/10/2021 3:00 AM    This note was created with Dragon medical transcription system.  Any error is purely unintentional

## 2021-12-10 NOTE — Progress Notes (Signed)
Progress Note   Patient: [name] WGN:[id number] DOB: 1970-12-23 DOA: 12/09/2021     0 DOS: the patient was seen and examined on 12/10/2021   Brief hospital course: [name] is a 51 year old female with history of hyperlipidemia, hypertension, depression, anxiety, nicotine dependence, insomnia, insulin-dependent diabetes mellitus, history of MRSA bacteremia, obesity, status post BKA (, who presents emergency department for chief concerns of redness and purulence of her right BKA stump.  Patient has had multiple hospitalization in the past few months. Patient was recently admitted in the hospital between 11/04/2021 until 11/20/2021 because of purulent discharge from the amputation site of left BKA.  She underwent revision of the amputation site.  There was also gangrene of the left foot with part of the calcaneal tuberosity avulsion.  She underwent right BKA.  Culture had Enterobacter in the left BKA site and Klebsiella, Enterobacter and MRSA in the right gangrene culture prior to her BKA. She received daptomycin till the day of discharge.  She also received ertapenem which was changed to ciprofloxacin p.o. twice daily for 2 more weeks on discharge.  She had received total of 4 weeks of antibiotics. Prior to this hospitalization she was admitted between 10/07/2021 until 10/14/2021 for MRSA bacteremia and MRSA wounds during on the left foot that hospitalization underwent BKA of the left foot.  She was treated with 6 weeks of IV antibiotics   Initial vitals in the emergency department showed temperature 98.5, respiration rate of 16, heart rate 79, blood pressure 137/70, SPO2 of 95% on room air.  Serum sodium is 136, potassium 3.9, chloride of 105, bicarb 23, BUN of 16, serum creatinine of 0.73, GFR greater than 60, nonfasting blood glucose 173, WBC 6.2, hemoglobin 8.1, platelets of 231.  Alk phos was elevated at 134, albumin 2.5, AST 11, ALT is 7.  Blood cultures x2 have been ordered and are in  process per EDP.  EDP discussed with vascular who states they will see the patient.  Vascular has requested infectious disease to be consulted.  ED treatment: Cefepime, vancomycin, Flagyl IV  6/2: Preliminary blood cultures negative, preliminary wound culture with rare gram-positive cocci. Vascular surgery removal of wound VAC and is planning to give her a wound VAC holiday before replacing in 1 to 2 days.  They really think that discharge is coming around the wound and there is no purulence noted.  Right stump staples to be removed by vascular surgery later today.    Assessment and Plan: * Leg wound, left, subsequent encounter - Status post left BKA on 01/16/4695 complicated by wound dehiscence and abscess formation and is status post left wound vac application on 08/19/50 - Vascular has been consulted-they removed the wound VAC yesterday - Infectious disease has been consulted - ID has requested deep wound culture which was collected by vascular surgery - Preliminary wound culture with rare gram-positive cocci -Continue broad-spectrum antibiotics with cefepime, vancomycin and Flagyl for now  MRSA bacteremia - History of MRSA bacteremia - Patient was discharged home with PICC line on 10/14/2021 with vancomycin and completed antibiotic treatment for 4 weeks on 11/07/2021  Hx of BKA, right ([place]) - Status post right BKA on 11/15/2021, the stump is healing appropriately and is due for suture removal outpatient  Uncontrolled type 2 diabetes mellitus with hyperglycemia, with long-term current use of insulin (HCC) CBG mildly elevated. - Resumed home long-acting insulin 35 units nightly - Hemoglobin A1c on 09/21/2021 was 10.1 - Insulin SSI with at bedtime coverage ordered for obese/resistance  dosing -Add 5 units for mealtime coverage - Goal inpatient blood glucose levels 140-180  Elevated LFTs - Mildly elevated alkaline phosphatase present on admission has been resolved.  Tobacco abuse - As  needed nicotine patch has been ordered - I have discussed extensively with patient at bedside the risk of continued tobacco use including poor wound healing and need for further amputation in addition to CVA, MI, PAD, cancer risks - Patient states that she is not ready and currently unwilling to quit  Breast cancer ([place]) - Status post mastectomy     Subjective: Patient was having left leg pain around her wound.  She was little frustrated for continuation of her infection but otherwise stable.  Physical Exam: Vitals:   12/09/21 2028 12/10/21 0359 12/10/21 0956 12/10/21 1115  BP: 130/62 111/67 118/68 122/61  Pulse: 77 68 68 69  Resp: 16 16  17   Temp: 97.8 F (36.6 C) 97.7 F (36.5 C) 97.6 F (36.4 C) 98.2 F (36.8 C)  TempSrc:      SpO2: 94% 99% 100% 100%  Weight:       General.  Well-developed lady, in no acute distress. Pulmonary.  Lungs clear bilaterally, normal respiratory effort. CV.  Regular rate and rhythm, no JVD, rub or murmur. Abdomen.  Soft, nontender, nondistended, BS positive. CNS.  Alert and oriented .  No focal neurologic deficit. Extremities.  Bilateral BKA, right stamp seems healing well with still having surgical staples, left stamp with bandage. Psychiatry.  Judgment and insight appears normal.  Data Reviewed: Prior notes, labs and images reviewed  Family Communication: Discussed with patient  Disposition: Status is: Inpatient Remains inpatient appropriate because: Severity of illness   Planned Discharge Destination: Home with Home Health  Time spent: 50 minutes  This record has been created using Systems analyst. Errors have been sought and corrected,but may not always be located. Such creation errors do not reflect on the standard of care.  Author: Lorella Nimrod, MD 12/10/2021 1:22 PM  For on call review [email or web address].

## 2021-12-11 ENCOUNTER — Other Ambulatory Visit: Payer: Self-pay

## 2021-12-11 LAB — CREATININE, SERUM
Creatinine, Ser: 0.87 mg/dL (ref 0.44–1.00)
GFR, Estimated: >60 mL/min (ref >60)

## 2021-12-11 LAB — GLUCOSE, CAPILLARY
Glucose-Capillary: 134 mg/dL — ABNORMAL HIGH (ref 70–99)
Glucose-Capillary: 93 mg/dL (ref 70–99)

## 2021-12-11 MED ORDER — SULFAMETHOXAZOLE-TRIMETHOPRIM 800-160 MG PO TABS: 1.0000 | ORAL_TABLET | Freq: Two times a day (BID) | ORAL | 0 refills | Status: AC

## 2021-12-11 NOTE — Progress Notes (Addendum)
1545 D/c instructions reviewed with pt. IV removed. Pt awaiting EMS to transport home as she is bilateral BKA  1945 Transport here to transport pt home

## 2021-12-11 NOTE — Progress Notes (Signed)
[name]  Daily Progress Note   Subjective  -   Patient resting comfortably  Objective Vitals:   12/10/21 2016 12/10/21 2243 12/10/21 2356 12/11/21 0515  BP: 138/67   (!) 106/54  Pulse: 72   74  Resp: 16   16  Temp: 97.9 F (36.6 C)   98.7 F (37.1 C)  TempSrc:      SpO2: 100% 100%  99%  Weight:   108 kg   Height:   '5\' 4"'$  (1.626 m)     Intake/Output Summary (Last 24 hours) at 12/11/2021 0654 Last data filed at 12/11/2021 0615 Gross per 24 hour  Intake --  Output 3000 ml  Net -3000 ml    PULM  CTAB CV  RRR VASC  good granulation tissue and left below-knee amputation.  Intact right below-knee amputation  Laboratory CBC    Component Value Date/Time   WBC 5.0 12/10/2021 0740   HGB 7.7 (L) 12/10/2021 0740   HGB 12.2 09/16/2021 0847   HCT 25.8 (L) 12/10/2021 0740   HCT 38.0 09/16/2021 0847   PLT 225 12/10/2021 0740   PLT 200 09/16/2021 0847    BMET    Component Value Date/Time   NA 137 12/10/2021 0740   NA 137 01/08/2018 1117   K 4.0 12/10/2021 0740   CL 107 12/10/2021 0740   CO2 25 12/10/2021 0740   GLUCOSE 176 (H) 12/10/2021 0740   BUN 18 12/10/2021 0740   BUN 31 (H) 01/08/2018 1117   CREATININE 0.87 12/11/2021 0432   CREATININE 1.59 (H) 07/18/2019 1506   CALCIUM 8.4 (L) 12/10/2021 0740   GFRNONAA >60 12/11/2021 0432   GFRAA 51 (L) 02/25/2020 1504    Assessment/Planning:  Left below-knee amputation  Currently no plans for surgical intervention.  The wound itself is not draining any purulence that is a good beefy wound bed.  We are allowing time for the wound.  To have a holiday from the occlusive dressing before replacing the wound VAC tomorrow.  Ultimately, the goal for the wound is to allow the cavity to become flush with the overall wound.  Once the wound has been closed and on the edges somewhat we will likely need to do a skin graft to allow the wound to fully heal due to the large size of the wound.  The patient asked  about possible above-knee amputation and she is advised that at this time we are not considering an above-knee amputation.  Right below-knee amputation  Every other staple removed.  We will plan on removing the rest of the staples in approximately a week in our office or this can be done by her home health nurse.  Kris Hartmann  12/11/2021, 6:54 AM

## 2021-12-11 NOTE — Progress Notes (Signed)
Progress Note   Patient: [name] YNW:[id number] DOB: 11-12-1970 DOA: 12/09/2021     1 DOS: the patient was seen and examined on 12/11/2021   Brief hospital course: [name] is a 51 year old female with history of hyperlipidemia, hypertension, depression, anxiety, nicotine dependence, insomnia, insulin-dependent diabetes mellitus, history of MRSA bacteremia, obesity, status post BKA (, who presents emergency department for chief concerns of redness and purulence of her right BKA stump.  Patient has had multiple hospitalization in the past few months. Patient was recently admitted in the hospital between 11/04/2021 until 11/20/2021 because of purulent discharge from the amputation site of left BKA.  She underwent revision of the amputation site.  There was also gangrene of the left foot with part of the calcaneal tuberosity avulsion.  She underwent right BKA.  Culture had Enterobacter in the left BKA site and Klebsiella, Enterobacter and MRSA in the right gangrene culture prior to her BKA. She received daptomycin till the day of discharge.  She also received ertapenem which was changed to ciprofloxacin p.o. twice daily for 2 more weeks on discharge.  She had received total of 4 weeks of antibiotics. Prior to this hospitalization she was admitted between 10/07/2021 until 10/14/2021 for MRSA bacteremia and MRSA wounds during on the left foot that hospitalization underwent BKA of the left foot.  She was treated with 6 weeks of IV antibiotics   Initial vitals in the emergency department showed temperature 98.5, respiration rate of 16, heart rate 79, blood pressure 137/70, SPO2 of 95% on room air.  Serum sodium is 136, potassium 3.9, chloride of 105, bicarb 23, BUN of 16, serum creatinine of 0.73, GFR greater than 60, nonfasting blood glucose 173, WBC 6.2, hemoglobin 8.1, platelets of 231.  Alk phos was elevated at 134, albumin 2.5, AST 11, ALT is 7.  Blood cultures x2 have been ordered and are in  process per EDP.  EDP discussed with vascular who states they will see the patient.  Vascular has requested infectious disease to be consulted.  ED treatment: Cefepime, vancomycin, Flagyl IV  6/2: Preliminary blood cultures negative, preliminary wound culture with rare gram-positive cocci. Vascular surgery removal of wound VAC and is planning to give her a wound VAC holiday before replacing in 1 to 2 days.  They really think that discharge is coming around the wound and there is no purulence noted.  Right stump staples to be removed by vascular surgery later today.  6/3 Plan for wound vac replacement by Vascular surgery. Wound continues to drain. Progressing   Assessment and Plan: * Leg wound, left, subsequent encounter - Status post left BKA on 12/13/7844 complicated by wound dehiscence and abscess formation and is status post left wound vac application on 03/16/28 - Vascular has been consulted- awaiting formal plan, underwent wound vac holiday 12/10/21 - Infectious disease has been consulted - ID has requested deep wound culture which was collected by vascular surgery NGTD - Preliminary wound culture with rare gram-positive cocci -Continue antibiotics per ID recommendations, greatly appreciate guidance. Currently on [facility]ero 6/2-   MRSA bacteremia - History of MRSA bacteremia - Patient was discharged home with PICC line on 10/14/2021 with vancomycin and completed antibiotic treatment for 4 weeks on 11/07/2021  Hx of BKA, right ([place]) - Status post right BKA on 11/15/2021, the stump is healing appropriately and is due for suture removal outpatient  Uncontrolled type 2 diabetes mellitus with hyperglycemia, with long-term current use of insulin (HCC) CBG mildly elevated. - Resumed home long-acting  insulin 35 units nightly - Hemoglobin A1c on 09/21/2021 was 10.1 - Insulin SSI with at bedtime coverage ordered for obese/resistance dosing -Add 5 units for mealtime coverage - Goal inpatient blood  glucose levels 140-180  Elevated LFTs - Mildly elevated alkaline phosphatase present on admission has been resolved.  Tobacco abuse - As needed nicotine patch has been ordered - I have discussed extensively with patient at bedside the risk of continued tobacco use including poor wound healing and need for further amputation in addition to CVA, MI, PAD, cancer risks - Patient states that she is not ready and currently unwilling to quit  Breast cancer ([place]) - Status post mastectomy     Subjective: NAEON No fevers, chills, N/V. No abd pain No diarrhea No thrush No complaints this AM.   Physical Exam: Vitals:   12/10/21 2243 12/10/21 2356 12/11/21 0515 12/11/21 0831  BP:   (!) 106/54 (!) 150/69  Pulse:   74 78  Resp:   16 18  Temp:   98.7 F (37.1 C) 97.6 F (36.4 C)  TempSrc:      SpO2: 100%  99% 99%  Weight:  108 kg    Height:  5' 4"  (1.626 m)     General.  Well-developed lady, in no acute distress. Pulmonary.  Lungs clear bilaterally, normal respiratory effort. CV.  Regular rate and rhythm, no JVD, rub or murmur. Abdomen.  Soft, nontender, nondistended, BS positive. CNS.  Alert and oriented .  No focal neurologic deficit. Extremities.  Bilateral BKA, right stamp seems healing well with still having surgical staples, left stamp with bandage. Psychiatry.  Judgment and insight appears normal.  Data Reviewed: Prior notes, labs and images reviewed  Family Communication: Discussed with patient  Disposition: Status is: Inpatient Remains inpatient appropriate because: Severity of illness   Planned Discharge Destination: Home with Home Health  Time spent: 35 minutes  This record has been created using Systems analyst. Errors have been sought and corrected,but may not always be located. Such creation errors do not reflect on the standard of care.  Author: Vanna Scotland, MD 12/11/2021 12:08 PM  For on call review [email or web address].

## 2021-12-11 NOTE — Assessment & Plan Note (Signed)
-   Status post left BKA on 12/16/2895 complicated by wound dehiscence and abscess formation and is status post left wound vac application on 03/11/49 - Vascular has been consulted- awaiting formal plan, underwent wound vac holiday 12/10/21 - Infectious disease has been consulted - ID has requested deep wound culture which was collected by vascular surgery NGTD - Preliminary wound culture with rare gram-positive cocci -Continue antibiotics per ID recommendations, greatly appreciate guidance. Currently on [facility] 6/2-

## 2021-12-11 NOTE — Discharge Summary (Deleted)
Physician Discharge Summary  Patient ID: [name] MRN: [id number] DOB/AGE: 11-Apr-1971 51 y.o.  Admit date: 12/09/2021 Discharge date: 12/11/2021  Admission Diagnoses:  Discharge Diagnoses:  Principal Problem:   Leg wound, left, subsequent encounter Active Problems:   Tobacco abuse   Visual loss, bilateral   Uncontrolled type 2 diabetes mellitus with hyperglycemia, with long-term current use of insulin (HCC)   PVD (peripheral vascular disease) (HCC)   Breast cancer (HCC)   MRSA bacteremia   Elevated LFTs   Hx of BKA, right ([place])   Discharged Condition: good  Hospital Course:  Pt underwent R leg thrombolytic catheter placement on 12/09/21.  The patient then underwent on 12/10/21: suction thrombectomy, PTA tibial and femoropopliteal arteries, and stenting popliteal artery.  Pt's was continued on Integrillin drip overnight and started on Eliquis.  On discharge, pt will be started on Eliquis.  Pt will follow up with Dr. Lucky Cowboy in 2 weeks.  Call for appointment.   Consults: None  Significant Diagnostic Studies: See Angio Op Notes  Treatments: Same as above  Discharge Exam: Blood pressure (!) 150/69, pulse 78, temperature 97.6 F (36.4 C), resp. rate 18, height $RemoveBe'5\' 4"'MUXEyudKR$  (1.626 m), weight 108 kg, SpO2 99 %. See Progress note from 12/11/21  Disposition: home   Allergies as of 12/11/2021       Reactions   Adhesive [tape] Rash, Other (See Comments)   TAKES OFF THE SKIN (CERTAIN MEDICAL TAPES DO THIS!!)   Metoprolol Shortness Of Breath   Occurrence of shortness of breath after 3 days   Montelukast Shortness Of Breath   Morphine Sulfate Anaphylaxis, Shortness Of Breath, Nausea And Vomiting   Swollen Throat - Able to tolerate dilaudid   Penicillins Anaphylaxis, Hives, Shortness Of Breath   Throat swells Has patient had a PCN reaction causing immediate rash, facial/tongue/throat swelling, SOB or lightheadedness with hypotension: Yes Has patient had a PCN reaction causing severe rash  involving mucus membranes or skin necrosis: No Has patient had a PCN reaction that required hospitalization: Yes Has patient had a PCN reaction occurring within the last 10 years: No If all of the above answers are "NO", then may proceed with Cephalosporin use.   Silicone Rash   Takes off the skin (certain medical tapes do this)   Diltiazem Swelling   Doxycycline Hives   Gabapentin Swelling   Midodrine    Lightheaded and falling down   Percocet [oxycodone-acetaminophen] Rash        Medication List     TAKE these medications    acetaminophen 325 MG tablet Commonly known as: TYLENOL Take 2 tablets (650 mg total) by mouth every 4 (four) hours as needed for headache or mild pain.   albuterol 108 (90 Base) MCG/ACT inhaler Commonly known as: VENTOLIN HFA Inhale 2 puffs into the lungs every 6 (six) hours as needed for wheezing or shortness of breath.   ALPRAZolam 1 MG tablet Commonly known as: XANAX Take 1 tablet (1 mg total) by mouth 4 (four) times daily as needed for anxiety.   anastrozole 1 MG tablet Commonly known as: ARIMIDEX Take 1 tablet (1 mg total) by mouth daily.   aspirin EC 81 MG tablet Take 81 mg by mouth daily before breakfast.   blood glucose meter kit and supplies Use up to four times daily as directed. (FOR ICD-10 E10.9, E11.9).   budesonide-formoterol 80-4.5 MCG/ACT inhaler Commonly known as: Symbicort INHALE 2 PUFFS BY MOUTH EVERY 12 HOURS TO PREVENT COUGH OR WHEEZING *RINSE MOUTH AFTER EACH USE*  busPIRone 30 MG tablet Commonly known as: BUSPAR Take 30 mg by mouth 2 (two) times daily.   citalopram 20 MG tablet Commonly known as: CELEXA Take 1 tablet (20 mg total) by mouth daily.   desvenlafaxine 100 MG 24 hr tablet Commonly known as: PRISTIQ Take 100 mg by mouth daily.   Eylea 2 MG/0.05ML Sosy Generic drug: Aflibercept   glucose blood test strip Use as instructed   insulin aspart 100 UNIT/ML FlexPen Commonly known as: NOVOLOG Inject 1-9  Units into the skin 4 (four) times daily -  before meals and at bedtime. Glucose 121 - 150: 1 unit, Glucose 151 - 200: 2 units, Glucose 201 - 250: 3 units, Glucose 251 - 300: 5 units, Glucose 301 - 350: 7 units, Glucose 351 - 400: 9 units, Glucose > 400 call MD   insulin glargine 100 UNIT/ML Solostar Pen Commonly known as: LANTUS Inject 35 Units into the skin daily.   Insulin Pen Needle 32G X 4 MM Misc Inject 1 each into the skin 4 (four) times daily -  before meals and at bedtime. Used to give insulin injections twice daily.   nicotine 14 mg/24hr patch Commonly known as: NICODERM CQ - dosed in mg/24 hours Place 1 patch (14 mg total) onto the skin daily.   ondansetron 8 MG tablet Commonly known as: ZOFRAN One pill every 8 hours as needed for nausea/vomitting.   oxyCODONE 5 MG immediate release tablet Commonly known as: Oxy IR/ROXICODONE Take 1 tablet (5 mg total) by mouth every 6 (six) hours as needed for severe pain.   OXYGEN Inhale 2-4 L into the lungs 2 (two) times daily as needed (shortness of breath).   pregabalin 300 MG capsule Commonly known as: LYRICA TAKE 1 CAPSULE BY MOUTH TWICE DAILY FOR  NERVE  PAIN   ReliOn Ultra Thin Lancets 30G Misc Use as directed for blood sugar checks.   rOPINIRole 1 MG tablet Commonly known as: REQUIP TAKE 1 TABLET BY MOUTH EVERY DAY AT BEDTIME FOR  RESTLESS  LEG   rosuvastatin 10 MG tablet Commonly known as: CRESTOR Take 1 tablet (10 mg total) by mouth at bedtime.   spironolactone 50 MG tablet Commonly known as: ALDACTONE Take 50 mg by mouth daily.   topiramate 50 MG tablet Commonly known as: TOPAMAX TAKE 1 TABLET BY MOUTH TWICE DAILY FOR HEADACHE PREVENTION   traZODone 100 MG tablet Commonly known as: DESYREL Take 200 mg by mouth at bedtime.         Signed: Adele Barthel 12/11/2021, 10:17 AM

## 2021-12-11 NOTE — Plan of Care (Signed)

## 2021-12-11 NOTE — Discharge Summary (Signed)
Physician Discharge Summary   Patient: [name] MRN: [id number] DOB: [birth year]  Admit date:     12/09/2021  Discharge date: 12/11/21  Discharge Physician: Vanna Scotland   PCP: Pleas Koch, NP   Recommendations at discharge:    F/u with Infectious Disease F/u with Vascular surgery  Discharge Diagnoses: Principal Problem:   Leg wound, left, subsequent encounter Active Problems:   MRSA bacteremia   Hx of BKA, right ([place])   Uncontrolled type 2 diabetes mellitus with hyperglycemia, with long-term current use of insulin (HCC)   PVD (peripheral vascular disease) (HCC)   Elevated LFTs   Visual loss, bilateral   Tobacco abuse   Breast cancer ([place])  Resolved Problems:   Leg wound, left, subsequent encounter  Hospital Course: [name] is a 51 year old female with history of hyperlipidemia, hypertension, depression, anxiety, nicotine dependence, insomnia, insulin-dependent diabetes mellitus, history of MRSA bacteremia, obesity, status post BKA (, who presents emergency department for chief concerns of redness and purulence of her right BKA stump.  Patient has had multiple hospitalization in the past few months. Patient was recently admitted in the hospital between 11/04/2021 until 11/20/2021 because of purulent discharge from the amputation site of left BKA.  She underwent revision of the amputation site.  There was also gangrene of the left foot with part of the calcaneal tuberosity avulsion.  She underwent right BKA.  Culture had Enterobacter in the left BKA site and Klebsiella, Enterobacter and MRSA in the right gangrene culture prior to her BKA. She received daptomycin till the day of discharge.  She also received ertapenem which was changed to ciprofloxacin p.o. twice daily for 2 more weeks on discharge.  She had received total of 4 weeks of antibiotics. Prior to this hospitalization she was admitted between 10/07/2021 until 10/14/2021 for MRSA bacteremia and MRSA wounds  during on the left foot that hospitalization underwent BKA of the left foot.  She was treated with 6 weeks of IV antibiotics   Initial vitals in the emergency department showed temperature 98.5, respiration rate of 16, heart rate 79, blood pressure 137/70, SPO2 of 95% on room air.  Serum sodium is 136, potassium 3.9, chloride of 105, bicarb 23, BUN of 16, serum creatinine of 0.73, GFR greater than 60, nonfasting blood glucose 173, WBC 6.2, hemoglobin 8.1, platelets of 231.  Alk phos was elevated at 134, albumin 2.5, AST 11, ALT is 7.  Blood cultures x2 have been ordered and are in process per EDP.  EDP discussed with vascular who states they will see the patient.  Vascular has requested infectious disease to be consulted.  ED treatment: Cefepime, vancomycin, Flagyl IV  6/2: Preliminary blood cultures negative, preliminary wound culture with rare gram-positive cocci. Vascular surgery removal of wound VAC and is planning to give her a wound VAC holiday before replacing in 1 to 2 days.  They really think that discharge is coming around the wound and there is no purulence noted.  Right stump staples to be removed by vascular surgery later today.  6/3 Plan for wound vac replacement by Vascular surgery. Wound continues to drain. Progressing  Assessment and Plan: * Leg wound, left, subsequent encounter - Status post left BKA on 12/13/7844 complicated by wound dehiscence and abscess formation and is status post left wound vac application on 03/16/28 - Vascular has been consulted- awaiting formal plan, underwent wound vac holiday 12/10/21 - Infectious disease has been consulted - ID has requested deep wound culture which was collected by vascular  surgery NGTD - Preliminary wound culture with rare gram-positive cocci -Continue antibiotics per ID recommendations, greatly appreciate guidance. Transitioned to [facility] 6/2  Vascular surgery (Dr. Adele Barthel) stated no acute indication for surgical management and  would like patient discharged on antibiotics with 2 week follow up. Discussed case with Dr. Delaine Lame who recommended transitioning to Bactrim x 1 week, cultures remain pending.  MRSA bacteremia - History of MRSA bacteremia - Patient was discharged home with PICC line on 10/14/2021 with vancomycin and completed antibiotic treatment for 4 weeks on 11/07/2021  Hx of BKA, right ([place]) - Status post right BKA on 11/15/2021, the stump is healing appropriately and is due for suture removal outpatient  Uncontrolled type 2 diabetes mellitus with hyperglycemia, with long-term current use of insulin (HCC) CBG mildly elevated. - Resumed home long-acting insulin 35 units nightly - Hemoglobin A1c on 09/21/2021 was 10.1 - Insulin SSI with at bedtime coverage ordered for obese/resistance dosing -Add 5 units for mealtime coverage - Goal inpatient blood glucose levels 140-180  Elevated LFTs - Mildly elevated alkaline phosphatase present on admission has been resolved.  Tobacco abuse - As needed nicotine patch has been ordered - I have discussed extensively with patient at bedside the risk of continued tobacco use including poor wound healing and need for further amputation in addition to CVA, MI, PAD, cancer risks - Patient states that she is not ready and currently unwilling to quit  Breast cancer [facility]) - Status post mastectomy        Pain control - [employer] Controlled Substance Reporting System database was reviewed. and patient was instructed, not to drive, operate heavy machinery, perform activities at heights, swimming or participation in water activities or provide baby-sitting services while on Pain, Sleep and Anxiety Medications; until their outpatient Physician has advised to do so again. Also recommended to not to take more than prescribed Pain, Sleep and Anxiety Medications.  Consultants: Vascular Surgery, Infectious diseas Procedures performed: n/a Disposition: Home Diet  recommendation:  Discharge Diet Orders (From admission, onward)     Start     Ordered   12/11/21 0000  Diet - low sodium heart healthy        12/11/21 1407           Cardiac diet DISCHARGE MEDICATION: Allergies as of 12/11/2021       Reactions   Adhesive [tape] Rash, Other (See Comments)   TAKES OFF THE SKIN (CERTAIN MEDICAL TAPES DO THIS!!)   Metoprolol Shortness Of Breath   Occurrence of shortness of breath after 3 days   Montelukast Shortness Of Breath   Morphine Sulfate Anaphylaxis, Shortness Of Breath, Nausea And Vomiting   Swollen Throat - Able to tolerate dilaudid   Penicillins Anaphylaxis, Hives, Shortness Of Breath   Throat swells Has patient had a PCN reaction causing immediate rash, facial/tongue/throat swelling, SOB or lightheadedness with hypotension: Yes Has patient had a PCN reaction causing severe rash involving mucus membranes or skin necrosis: No Has patient had a PCN reaction that required hospitalization: Yes Has patient had a PCN reaction occurring within the last 10 years: No If all of the above answers are "NO", then may proceed with Cephalosporin use.   Silicone Rash   Takes off the skin (certain medical tapes do this)   Diltiazem Swelling   Doxycycline Hives   Gabapentin Swelling   Midodrine    Lightheaded and falling down   Percocet [oxycodone-acetaminophen] Rash        Medication List  TAKE these medications    acetaminophen 325 MG tablet Commonly known as: TYLENOL Take 2 tablets (650 mg total) by mouth every 4 (four) hours as needed for headache or mild pain.   albuterol 108 (90 Base) MCG/ACT inhaler Commonly known as: VENTOLIN HFA Inhale 2 puffs into the lungs every 6 (six) hours as needed for wheezing or shortness of breath.   ALPRAZolam 1 MG tablet Commonly known as: XANAX Take 1 tablet (1 mg total) by mouth 4 (four) times daily as needed for anxiety.   anastrozole 1 MG tablet Commonly known as: ARIMIDEX Take 1 tablet (1 mg  total) by mouth daily.   aspirin EC 81 MG tablet Take 81 mg by mouth daily before breakfast.   blood glucose meter kit and supplies Use up to four times daily as directed. (FOR ICD-10 E10.9, E11.9).   budesonide-formoterol 80-4.5 MCG/ACT inhaler Commonly known as: Symbicort INHALE 2 PUFFS BY MOUTH EVERY 12 HOURS TO PREVENT COUGH OR WHEEZING *RINSE MOUTH AFTER EACH USE*   busPIRone 30 MG tablet Commonly known as: BUSPAR Take 30 mg by mouth 2 (two) times daily.   citalopram 20 MG tablet Commonly known as: CELEXA Take 1 tablet (20 mg total) by mouth daily.   desvenlafaxine 100 MG 24 hr tablet Commonly known as: PRISTIQ Take 100 mg by mouth daily.   Eylea 2 MG/0.05ML S[place] Generic drug: Aflibercept   glucose blood test strip Use as instructed   insulin aspart 100 UNIT/ML FlexPen Commonly known as: NOVOLOG Inject 1-9 Units into the skin 4 (four) times daily -  before meals and at bedtime. Glucose 121 - 150: 1 unit, Glucose 151 - 200: 2 units, Glucose 201 - 250: 3 units, Glucose 251 - 300: 5 units, Glucose 301 - 350: 7 units, Glucose 351 - 400: 9 units, Glucose > 400 call MD   insulin glargine 100 UNIT/ML Solostar Pen Commonly known as: LANTUS Inject 35 Units into the skin daily.   Insulin Pen Needle 32G X 4 MM Misc Inject 1 each into the skin 4 (four) times daily -  before meals and at bedtime. Used to give insulin injections twice daily.   nicotine 14 mg/24hr patch Commonly known as: NICODERM CQ - dosed in mg/24 hours Place 1 patch (14 mg total) onto the skin daily.   ondansetron 8 MG tablet Commonly known as: ZOFRAN One pill every 8 hours as needed for nausea/vomitting.   oxyCODONE 5 MG immediate release tablet Commonly known as: Oxy IR/ROXICODONE Take 1 tablet (5 mg total) by mouth every 6 (six) hours as needed for severe pain.   OXYGEN Inhale 2-4 L into the lungs 2 (two) times daily as needed (shortness of breath).   pregabalin 300 MG capsule Commonly known as:  LYRICA TAKE 1 CAPSULE BY MOUTH TWICE DAILY FOR  NERVE  PAIN   ReliOn Ultra Thin Lancets 30G Misc Use as directed for blood sugar checks.   rOPINIRole 1 MG tablet Commonly known as: REQUIP TAKE 1 TABLET BY MOUTH EVERY DAY AT BEDTIME FOR  RESTLESS  LEG   rosuvastatin 10 MG tablet Commonly known as: CRESTOR Take 1 tablet (10 mg total) by mouth at bedtime.   spironolactone 50 MG tablet Commonly known as: ALDACTONE Take 50 mg by mouth daily.   sulfamethoxazole-trimethoprim 800-160 MG tablet Commonly known as: BACTRIM DS Take 1 tablet by mouth 2 (two) times daily for 7 days.   topiramate 50 MG tablet Commonly known as: TOPAMAX TAKE 1 TABLET BY MOUTH TWICE DAILY  FOR HEADACHE PREVENTION   traZODone 100 MG tablet Commonly known as: DESYREL Take 200 mg by mouth at bedtime.               Discharge Care Instructions  (From admission, onward)           Start     Ordered   12/11/21 0000  Discharge wound care:       Comments: Wound vac per vascular surgery recommendations   12/11/21 1407            Follow-up Information     Tsosie Billing, MD Follow up in 1 week(s).   Specialty: Infectious Diseases Contact information: [place] [address] [phone]         Algernon Huxley, MD Follow up in 2 week(s).   Specialties: Vascular Surgery, Radiology, Interventional Cardiology Contact information: [address] [address] [phone]         Pleas Koch, NP Follow up in 1 week(s).   Specialty: Internal Medicine Contact information: [place] [address] [phone]                Discharge Exam: Danley Danker Weights   12/09/21 1308 12/10/21 2356  Weight: 107.9 kg 108 kg       General.  Well-developed lady, in no acute distress. Pulmonary.  Lungs clear bilaterally, normal respiratory effort. CV.  Regular rate and rhythm, no JVD, rub or murmur. Abdomen.  Soft, nontender,  nondistended, BS positive. CNS.  Alert and oriented .  No focal neurologic deficit. Extremities.  Bilateral BKA, right stamp seems healing well with still having surgical staples, left stamp with bandage. Psychiatry.  Judgment and insight appears normal.   Condition at discharge: fair  The results of significant diagnostics from this hospitalization (including imaging, microbiology, ancillary and laboratory) are listed below for reference.   Imaging Studies: No results found.  Microbiology: Results for orders placed or performed during the hospital encounter of 12/09/21  Blood culture (routine x 2)     Status: None (Preliminary result)   Collection Time: 12/09/21  1:23 PM   Specimen: BLOOD  Result Value Ref Range Status   Specimen Description BLOOD LEFT ARM  Final   Special Requests   Final    BOTTLES DRAWN AEROBIC AND ANAEROBIC Blood Culture adequate volume   Culture   Final    NO GROWTH 2 DAYS Performed at [facility], [address], [place], [place] [address]    Report Status PENDING  Incomplete  Blood culture (routine x 2)     Status: None (Preliminary result)   Collection Time: 12/09/21  2:27 PM   Specimen: BLOOD  Result Value Ref Range Status   Specimen Description BLOOD BLLA  Final   Special Requests BOTTLES DRAWN AEROBIC AND ANAEROBIC BCAV  Final   Culture   Final    NO GROWTH 2 DAYS Performed at [facility], [address], [place], [place] [address]    Report Status PENDING  Incomplete  Aerobic/Anaerobic Culture w Gram Stain (surgical/deep wound)     Status: None (Preliminary result)   Collection Time: 12/09/21  2:38 PM   Specimen: Wound  Result Value Ref Range Status   Specimen Description   Final    WOUND Performed at [facility], [address] [place], [place] [address]    Special Requests   Final    NONE Performed at [facility], [address]  [place], [place] [address]    Gram Stain   Final    NO  SQUAMOUS EPITHELIAL CELLS SEEN FEW WBC SEEN RARE GRAM POSITIVE COCCI    Culture   Final    FEW STAPHYLOCOCCUS AUREUS FEW GROUP A STREP (S.PYOGENES) ISOLATED Beta hemolytic streptococci are predictably susceptible to penicillin and other beta lactams. Susceptibility testing not routinely performed. RARE PROTEUS MIRABILIS CULTURE REINCUBATED FOR BETTER GROWTH Performed at [facility], [place] [address], [place], [place] [address]    Report Status PENDING  Incomplete   *Note: Due to a large number of results and/or encounters for the requested time period, some results have not been displayed. A complete set of results can be found in Results Review.    Labs: CBC: Recent Labs  Lab 12/09/21 1323 12/10/21 0740  WBC 6.7 5.0  HGB 8.1* 7.7*  HCT 27.5* 25.8*  MCV 82.6 81.9  PLT 231 270   Basic Metabolic Panel: Recent Labs  Lab 12/09/21 1323 12/10/21 0740 12/11/21 0432  NA 136 137  --   K 3.9 4.0  --   CL 105 107  --   CO2 23 25  --   GLUCOSE 173* 176*  --   BUN 16 18  --   CREATININE 0.73 0.75 0.87  CALCIUM 8.6* 8.4*  --    Liver Function Tests: Recent Labs  Lab 12/09/21 1323 12/10/21 0740  AST 11* 8*  ALT 7 7  ALKPHOS 134* 121  BILITOT 1.1 0.8  PROT 8.6* 7.8  ALBUMIN 2.5* 2.3*   CBG: Recent Labs  Lab 12/10/21 0824 12/10/21 1116 12/10/21 1644 12/10/21 2137 12/11/21 1212  GLUCAP 174* 152* 120* 136* 134*    Discharge time spent: less than 30 minutes.  Signed: Vanna Scotland, MD [facility] 12/11/2021

## 2021-12-11 NOTE — Progress Notes (Addendum)
      Daily Progress Note   Assessment/Planning:   s/p L BKA complicated with wound  Asked by Dr. Lucky Cowboy to replace [facility] but no supples and no VAC pump in the room Will have pt's nurse try to track down supplies for placement   Subjective  - * No surgery found *   No complaints   Objective   Vitals:   12/10/21 2016 12/10/21 2243 12/10/21 2356 12/11/21 0515  BP: 138/67   (!) 106/54  Pulse: 72   74  Resp: 16   16  Temp: 97.9 F (36.6 C)   98.7 F (37.1 C)  TempSrc:      SpO2: 100% 100%  99%  Weight:   108 kg   Height:   '5\' 4"'$  (1.626 m)      Intake/Output Summary (Last 24 hours) at 12/11/2021 0811 Last data filed at 12/11/2021 0615 Gross per 24 hour  Intake --  Output 3000 ml  Net -3000 ml    VASC L BKA bandaged    Laboratory   CBC    Latest Ref Rng & Units 12/10/2021    7:40 AM 12/09/2021    1:23 PM 11/20/2021    4:39 AM  CBC  WBC 4.0 - 10.5 K/uL 5.0   6.7   8.3    Hemoglobin 12.0 - 15.0 g/dL 7.7   8.1   7.6    Hematocrit 36.0 - 46.0 % 25.8   27.5   25.3    Platelets 150 - 400 K/uL 225   231   250      BMET    Component Value Date/Time   NA 137 12/10/2021 0740   NA 137 01/08/2018 1117   K 4.0 12/10/2021 0740   CL 107 12/10/2021 0740   CO2 25 12/10/2021 0740   GLUCOSE 176 (H) 12/10/2021 0740   BUN 18 12/10/2021 0740   BUN 31 (H) 01/08/2018 1117   CREATININE 0.87 12/11/2021 0432   CREATININE 1.59 (H) 07/18/2019 1506   CALCIUM 8.4 (L) 12/10/2021 0740   GFRNONAA >60 12/11/2021 0432   GFRAA 51 (L) 02/25/2020 1504     Adele Barthel, MD, FACS, FSVS Covering for [facility]: [phone]  12/11/2021, 8:11 AM   Addendum Discussed with pt's nurse.  Will try to track down a VAC pump and supplies.  Addendum Pt's home VAC supplies used with home VAC pump to dress L BKA.  L BKA wound has extensive granulation, 10% residual necrotic tissue in process of slough.  - Per Dr. Lucky Cowboy, ok to D/C pt - Call office for follow up in 2-4  weeks.

## 2021-12-12 ENCOUNTER — Other Ambulatory Visit: Payer: Self-pay

## 2021-12-12 ENCOUNTER — Inpatient Hospital Stay (HOSPITAL_COMMUNITY)
Admission: EM | Admit: 2021-12-12 | Discharge: 2021-12-14 | Discharge status: Against Medical Advice | Payer: BC Managed Care – PPO | Source: Home / Self Care | Attending: Internal Medicine | Admitting: Internal Medicine

## 2021-12-12 ENCOUNTER — Encounter: Payer: Self-pay | Admitting: Emergency Medicine

## 2021-12-12 DIAGNOSIS — Z8249 Family history of ischemic heart disease and other diseases of the circulatory system: Secondary | ICD-10-CM

## 2021-12-12 DIAGNOSIS — R7989 Other specified abnormal findings of blood chemistry: Principal | ICD-10-CM

## 2021-12-12 DIAGNOSIS — F418 Other specified anxiety disorders: Secondary | ICD-10-CM | POA: Diagnosis not present

## 2021-12-12 DIAGNOSIS — R7881 Bacteremia: Secondary | ICD-10-CM

## 2021-12-12 DIAGNOSIS — E1122 Type 2 diabetes mellitus with diabetic chronic kidney disease: Secondary | ICD-10-CM | POA: Diagnosis present

## 2021-12-12 DIAGNOSIS — I5032 Chronic diastolic (congestive) heart failure: Secondary | ICD-10-CM | POA: Diagnosis present

## 2021-12-12 DIAGNOSIS — E1165 Type 2 diabetes mellitus with hyperglycemia: Secondary | ICD-10-CM

## 2021-12-12 DIAGNOSIS — T8744 Infection of amputation stump, left lower extremity: Secondary | ICD-10-CM | POA: Diagnosis present

## 2021-12-12 DIAGNOSIS — E785 Hyperlipidemia, unspecified: Secondary | ICD-10-CM

## 2021-12-12 DIAGNOSIS — G2581 Restless legs syndrome: Secondary | ICD-10-CM | POA: Diagnosis present

## 2021-12-12 DIAGNOSIS — Z89511 Acquired absence of right leg below knee: Secondary | ICD-10-CM

## 2021-12-12 DIAGNOSIS — Z9011 Acquired absence of right breast and nipple: Secondary | ICD-10-CM

## 2021-12-12 DIAGNOSIS — Z720 Tobacco use: Secondary | ICD-10-CM | POA: Diagnosis not present

## 2021-12-12 DIAGNOSIS — Z794 Long term (current) use of insulin: Secondary | ICD-10-CM

## 2021-12-12 DIAGNOSIS — Z885 Allergy status to narcotic agent status: Secondary | ICD-10-CM

## 2021-12-12 DIAGNOSIS — T148XXA Other injury of unspecified body region, initial encounter: Secondary | ICD-10-CM

## 2021-12-12 DIAGNOSIS — Z7982 Long term (current) use of aspirin: Secondary | ICD-10-CM

## 2021-12-12 DIAGNOSIS — Y835 Amputation of limb(s) as the cause of abnormal reaction of the patient, or of later complication, without mention of misadventure at the time of the procedure: Secondary | ICD-10-CM | POA: Diagnosis present

## 2021-12-12 DIAGNOSIS — F419 Anxiety disorder, unspecified: Secondary | ICD-10-CM | POA: Diagnosis present

## 2021-12-12 DIAGNOSIS — Z86718 Personal history of other venous thrombosis and embolism: Secondary | ICD-10-CM

## 2021-12-12 DIAGNOSIS — Z833 Family history of diabetes mellitus: Secondary | ICD-10-CM

## 2021-12-12 DIAGNOSIS — Z79899 Other long term (current) drug therapy: Secondary | ICD-10-CM

## 2021-12-12 DIAGNOSIS — E1169 Type 2 diabetes mellitus with other specified complication: Secondary | ICD-10-CM | POA: Diagnosis present

## 2021-12-12 DIAGNOSIS — E440 Moderate protein-calorie malnutrition: Secondary | ICD-10-CM | POA: Diagnosis present

## 2021-12-12 DIAGNOSIS — Z880 Allergy status to penicillin: Secondary | ICD-10-CM

## 2021-12-12 DIAGNOSIS — K219 Gastro-esophageal reflux disease without esophagitis: Secondary | ICD-10-CM | POA: Diagnosis present

## 2021-12-12 DIAGNOSIS — Z8673 Personal history of transient ischemic attack (TIA), and cerebral infarction without residual deficits: Secondary | ICD-10-CM

## 2021-12-12 DIAGNOSIS — B9562 Methicillin resistant Staphylococcus aureus infection as the cause of diseases classified elsewhere: Secondary | ICD-10-CM | POA: Diagnosis present

## 2021-12-12 DIAGNOSIS — F17210 Nicotine dependence, cigarettes, uncomplicated: Secondary | ICD-10-CM | POA: Diagnosis present

## 2021-12-12 DIAGNOSIS — I2510 Atherosclerotic heart disease of native coronary artery without angina pectoris: Secondary | ICD-10-CM | POA: Diagnosis present

## 2021-12-12 DIAGNOSIS — I130 Hypertensive heart and chronic kidney disease with heart failure and stage 1 through stage 4 chronic kidney disease, or unspecified chronic kidney disease: Secondary | ICD-10-CM | POA: Diagnosis present

## 2021-12-12 DIAGNOSIS — L089 Local infection of the skin and subcutaneous tissue, unspecified: Secondary | ICD-10-CM | POA: Diagnosis present

## 2021-12-12 DIAGNOSIS — Z888 Allergy status to other drugs, medicaments and biological substances status: Secondary | ICD-10-CM

## 2021-12-12 DIAGNOSIS — I4510 Unspecified right bundle-branch block: Secondary | ICD-10-CM | POA: Diagnosis present

## 2021-12-12 DIAGNOSIS — Z6837 Body mass index (BMI) 37.0-37.9, adult: Secondary | ICD-10-CM

## 2021-12-12 DIAGNOSIS — F319 Bipolar disorder, unspecified: Secondary | ICD-10-CM | POA: Diagnosis present

## 2021-12-12 DIAGNOSIS — D631 Anemia in chronic kidney disease: Secondary | ICD-10-CM | POA: Diagnosis present

## 2021-12-12 DIAGNOSIS — Z9981 Dependence on supplemental oxygen: Secondary | ICD-10-CM

## 2021-12-12 DIAGNOSIS — Z853 Personal history of malignant neoplasm of breast: Secondary | ICD-10-CM

## 2021-12-12 DIAGNOSIS — N182 Chronic kidney disease, stage 2 (mild): Secondary | ICD-10-CM | POA: Diagnosis present

## 2021-12-12 DIAGNOSIS — Z9109 Other allergy status, other than to drugs and biological substances: Secondary | ICD-10-CM

## 2021-12-12 DIAGNOSIS — J449 Chronic obstructive pulmonary disease, unspecified: Secondary | ICD-10-CM | POA: Diagnosis present

## 2021-12-12 DIAGNOSIS — E1142 Type 2 diabetes mellitus with diabetic polyneuropathy: Secondary | ICD-10-CM | POA: Diagnosis present

## 2021-12-12 DIAGNOSIS — F17200 Nicotine dependence, unspecified, uncomplicated: Secondary | ICD-10-CM | POA: Diagnosis present

## 2021-12-12 DIAGNOSIS — Z8619 Personal history of other infectious and parasitic diseases: Secondary | ICD-10-CM

## 2021-12-12 DIAGNOSIS — E669 Obesity, unspecified: Secondary | ICD-10-CM | POA: Diagnosis present

## 2021-12-12 DIAGNOSIS — Z825 Family history of asthma and other chronic lower respiratory diseases: Secondary | ICD-10-CM

## 2021-12-12 DIAGNOSIS — Z66 Do not resuscitate: Secondary | ICD-10-CM | POA: Diagnosis present

## 2021-12-12 DIAGNOSIS — A4902 Methicillin resistant Staphylococcus aureus infection, unspecified site: Secondary | ICD-10-CM | POA: Diagnosis present

## 2021-12-12 DIAGNOSIS — E781 Pure hyperglyceridemia: Secondary | ICD-10-CM | POA: Diagnosis present

## 2021-12-12 DIAGNOSIS — Z803 Family history of malignant neoplasm of breast: Secondary | ICD-10-CM

## 2021-12-12 DIAGNOSIS — Z881 Allergy status to other antibiotic agents status: Secondary | ICD-10-CM

## 2021-12-12 LAB — COMPREHENSIVE METABOLIC PANEL
ALT: 8 U/L (ref 0–44)
AST: 12 U/L — ABNORMAL LOW (ref 15–41)
Albumin: 2.5 g/dL — ABNORMAL LOW (ref 3.5–5.0)
Alkaline Phosphatase: 137 U/L — ABNORMAL HIGH (ref 38–126)
Anion gap: 6 (ref 5–15)
BUN: 16 mg/dL (ref 6–20)
CO2: 26 mmol/L (ref 22–32)
Calcium: 8.7 mg/dL — ABNORMAL LOW (ref 8.9–10.3)
Chloride: 107 mmol/L (ref 98–111)
Creatinine, Ser: 0.77 mg/dL (ref 0.44–1.00)
GFR, Estimated: >60 mL/min (ref >60)
Glucose, Bld: 142 mg/dL — ABNORMAL HIGH (ref 70–99)
Potassium: 4.2 mmol/L (ref 3.5–5.1)
Sodium: 139 mmol/L (ref 135–145)
Total Bilirubin: 1 mg/dL (ref 0.3–1.2)
Total Protein: 8.6 g/dL — ABNORMAL HIGH (ref 6.5–8.1)

## 2021-12-12 LAB — CBG MONITORING, ED: Glucose-Capillary: 156 mg/dL — ABNORMAL HIGH (ref 70–99)

## 2021-12-12 LAB — CBC
HCT: 29.1 % — ABNORMAL LOW (ref 36.0–46.0)
Hemoglobin: 8.4 g/dL — ABNORMAL LOW (ref 12.0–15.0)
MCH: 24.1 pg — ABNORMAL LOW (ref 26.0–34.0)
MCHC: 28.9 g/dL — ABNORMAL LOW (ref 30.0–36.0)
MCV: 83.4 fL (ref 80.0–100.0)
Platelets: 235 10*3/uL (ref 150–400)
RBC: 3.49 MIL/uL — ABNORMAL LOW (ref 3.87–5.11)
RDW: 17.1 % — ABNORMAL HIGH (ref 11.5–15.5)
WBC: 5.9 10*3/uL (ref 4.0–10.5)
nRBC: 0 % (ref 0.0–0.2)

## 2021-12-12 LAB — LACTIC ACID, PLASMA: Lactic Acid, Venous: 1 mmol/L (ref 0.5–1.9)

## 2021-12-12 MED ORDER — CITALOPRAM HYDROBROMIDE 20 MG PO TABS
20.0000 mg | ORAL_TABLET | Freq: Every day | ORAL | Status: DC
  Administered 2021-12-13 – 2021-12-14 (×2): 20 mg via ORAL
  Filled 2021-12-12 (×2): qty 1

## 2021-12-12 MED ORDER — ALBUTEROL SULFATE (2.5 MG/3ML) 0.083% IN NEBU: 2.5000 mg | INHALATION_SOLUTION | Freq: Four times a day (QID) | RESPIRATORY_TRACT | Status: DC | PRN

## 2021-12-12 MED ORDER — HEPARIN SODIUM (PORCINE) 5000 UNIT/ML IJ SOLN
5000.0000 [IU] | Freq: Three times a day (TID) | INTRAMUSCULAR | Status: DC
  Administered 2021-12-13 – 2021-12-14 (×3): 5000 [IU] via SUBCUTANEOUS
  Filled 2021-12-12 (×5): qty 1

## 2021-12-12 MED ORDER — ROPINIROLE HCL 1 MG PO TABS: 1.0000 mg | ORAL_TABLET | Freq: Every evening | ORAL | Status: DC | PRN

## 2021-12-12 MED ORDER — BUSPIRONE HCL 15 MG PO TABS
30.0000 mg | ORAL_TABLET | Freq: Two times a day (BID) | ORAL | Status: DC
  Administered 2021-12-12 – 2021-12-14 (×4): 30 mg via ORAL
  Filled 2021-12-12 (×2): qty 2
  Filled 2021-12-12: qty 6
  Filled 2021-12-12 (×2): qty 2
  Filled 2021-12-12: qty 3

## 2021-12-12 MED ORDER — ONDANSETRON HCL 4 MG/2ML IJ SOLN: 4.0000 mg | Freq: Four times a day (QID) | INTRAMUSCULAR | Status: DC | PRN

## 2021-12-12 MED ORDER — ACETAMINOPHEN 325 MG PO TABS: 650.0000 mg | ORAL_TABLET | Freq: Four times a day (QID) | ORAL | Status: DC | PRN

## 2021-12-12 MED ORDER — INSULIN ASPART 100 UNIT/ML IJ SOLN: 0.0000 [IU] | Freq: Every day | INTRAMUSCULAR | Status: DC

## 2021-12-12 MED ORDER — ALBUTEROL SULFATE HFA 108 (90 BASE) MCG/ACT IN AERS: 2.0000 | INHALATION_SPRAY | Freq: Four times a day (QID) | RESPIRATORY_TRACT | Status: DC | PRN

## 2021-12-12 MED ORDER — ALPRAZOLAM 0.5 MG PO TABS
1.0000 mg | ORAL_TABLET | Freq: Four times a day (QID) | ORAL | Status: DC | PRN
Start: 2021-12-12 — End: 2021-12-15
  Administered 2021-12-13 – 2021-12-14 (×2): 1 mg via ORAL
  Filled 2021-12-12 (×2): qty 2

## 2021-12-12 MED ORDER — VANCOMYCIN HCL IN DEXTROSE 1-5 GM/200ML-% IV SOLN
1000.0000 mg | Freq: Once | INTRAVENOUS | Status: AC
  Administered 2021-12-12: 1000 mg via INTRAVENOUS
  Filled 2021-12-12: qty 200

## 2021-12-12 MED ORDER — ASPIRIN 81 MG PO TBEC
81.0000 mg | DELAYED_RELEASE_TABLET | Freq: Every day | ORAL | Status: DC
  Administered 2021-12-13 – 2021-12-14 (×2): 81 mg via ORAL
  Filled 2021-12-12 (×2): qty 1

## 2021-12-12 MED ORDER — VENLAFAXINE HCL ER 75 MG PO CP24
150.0000 mg | ORAL_CAPSULE | Freq: Every day | ORAL | Status: DC
  Administered 2021-12-13 – 2021-12-14 (×2): 150 mg via ORAL
  Filled 2021-12-12: qty 1
  Filled 2021-12-12 (×2): qty 2

## 2021-12-12 MED ORDER — PREGABALIN 75 MG PO CAPS
300.0000 mg | ORAL_CAPSULE | Freq: Two times a day (BID) | ORAL | Status: DC
  Administered 2021-12-12 – 2021-12-14 (×4): 300 mg via ORAL
  Filled 2021-12-12 (×4): qty 4

## 2021-12-12 MED ORDER — ACETAMINOPHEN 650 MG RE SUPP: 650.0000 mg | Freq: Four times a day (QID) | RECTAL | Status: DC | PRN

## 2021-12-12 MED ORDER — INSULIN DETEMIR 100 UNIT/ML ~~LOC~~ SOLN
28.0000 [IU] | Freq: Every day | SUBCUTANEOUS | Status: DC
  Administered 2021-12-12 – 2021-12-13 (×2): 28 [IU] via SUBCUTANEOUS
  Filled 2021-12-12 (×3): qty 0.28

## 2021-12-12 MED ORDER — ROSUVASTATIN CALCIUM 10 MG PO TABS
10.0000 mg | ORAL_TABLET | Freq: Every day | ORAL | Status: DC
  Administered 2021-12-12 – 2021-12-13 (×2): 10 mg via ORAL
  Filled 2021-12-12 (×2): qty 1

## 2021-12-12 MED ORDER — ONDANSETRON HCL 4 MG PO TABS: 4.0000 mg | ORAL_TABLET | Freq: Four times a day (QID) | ORAL | Status: DC | PRN

## 2021-12-12 MED ORDER — INSULIN ASPART 100 UNIT/ML IJ SOLN
0.0000 [IU] | Freq: Three times a day (TID) | INTRAMUSCULAR | Status: DC
  Administered 2021-12-13: 2 [IU] via SUBCUTANEOUS
  Administered 2021-12-13: 3 [IU] via SUBCUTANEOUS
  Administered 2021-12-14: 8 [IU] via SUBCUTANEOUS
  Administered 2021-12-14 (×2): 2 [IU] via SUBCUTANEOUS
  Filled 2021-12-12 (×4): qty 1

## 2021-12-12 MED ORDER — SPIRONOLACTONE 25 MG PO TABS
50.0000 mg | ORAL_TABLET | Freq: Every day | ORAL | Status: DC
  Administered 2021-12-13: 50 mg via ORAL
  Filled 2021-12-12 (×2): qty 2

## 2021-12-12 MED ORDER — NICOTINE 7 MG/24HR TD PT24
7.0000 mg | MEDICATED_PATCH | Freq: Every day | TRANSDERMAL | Status: DC
  Administered 2021-12-13 – 2021-12-14 (×2): 7 mg via TRANSDERMAL
  Filled 2021-12-12 (×3): qty 1

## 2021-12-12 MED ORDER — TRAZODONE HCL 100 MG PO TABS
200.0000 mg | ORAL_TABLET | Freq: Every day | ORAL | Status: DC
  Administered 2021-12-12 – 2021-12-13 (×2): 200 mg via ORAL
  Filled 2021-12-12 (×2): qty 2

## 2021-12-12 NOTE — Assessment & Plan Note (Addendum)
Continue protein supplements.

## 2021-12-12 NOTE — ED Notes (Signed)
1 Set of cultures, lactic, green top and purple top sent down to lab

## 2021-12-12 NOTE — H&P (Signed)
History and Physical    PatientEster Foster WPY:[id number] DOB: 1970/11/21 DOA: 12/12/2021 DOS: the patient was seen and examined on 12/12/2021 PCP: Pleas Koch, NP  Patient coming from: Home  Chief Complaint:  Chief Complaint  Patient presents with   Blood Infection    Cultures + from previous admission    HPI: [name] is a 51 y.o. female with medical history significant of anxiety, depression, bipolar disorder, coronary artery disease, congestive heart failure, CKD, COPD, diabetes mellitus type 2, GERD, peripheral neuropathy, bilateral BKA, tobacco use disorder, history of seizures but not taking antiepileptic at this time, and more presents the ED with a chief complaint of blood infection.  Patient was recently discharged from the hospital yesterday afternoon.  At that time she had presented for redness and purulence of BKA stump.  She had had multiple other admissions this year for purulent drainage from left BKA stump as well.  Patient had a right BKA during last hospitalization.  The left stump was debrided and wound VAC was placed.  Patient received daptomycin until the day of her discharge.  She also received ertapenem and mero per discharge summary, and ID recommended Bactrim for 1 more week.   Patient reports compliance with medication regimen.  She also had home health set up to come out 3 times a week for her wound VAC.  Patient reports that she presented today because she was called and told to come into the hospital for MRSA bacteremia.  Patient denies any pain, fevers, diaphoresis, dizziness, general malaise/fatigue.  She is wearing oxygen at the time of my exam and reports that she is chronically on oxygen at night 1.5-2 L.  She had no trouble breathing and no chest pain.  Patient is coming to [place] from home where she lives with her husband and son who help her with every day tasks.  Patient denies any change in the output of her wound VAC.  She has no other complaints at  this time.  Patient is a current smoker, 5 cigarettes/day.  She does request nicotine patch.  She does not drink alcohol, does not use illicit drugs, is vaccinated for COVID.  Patient is DNR. Review of Systems: As mentioned in the history of present illness. All other systems reviewed and are negative. Past Medical History:  Diagnosis Date   Anxiety    takes Prozac daily   Anxiety    Aortic valve calcification    Asthma    Advair and Spirva daily   Asthma    Bipolar disorder (HCC)    Breast cancer, right breast ([place]) 12/23/2019   CAD (coronary artery disease)    a. LHC 11/2013 done for CP/fluid retention: mild disease in prox LAD, mild-mod disease in mRCA, EF 60% with normal LVEDP. b. Normal nuc 03/2016.   Cancer ([place])    Cellulitis and abscess of trunk 03/12/2020   CHF (congestive heart failure) (HCC)    Chronic diastolic CHF (congestive heart failure) (HCC)    Chronic heart failure with preserved ejection fraction ([place]) 11/16/2018   CKD (chronic kidney disease), stage II    COPD (chronic obstructive pulmonary disease) (HCC)    a. nocturnal O2.   COPD (chronic obstructive pulmonary disease) (HCC)    Coronary artery disease    Decreased urine stream    Diabetes mellitus    Diabetes mellitus without complication (HCC)    Dyspnea    Elevated C-reactive protein (CRP) 01/29/2018   Elevated sedimentation rate 01/29/2018   Elevated  serum hCG 01/19/2018   Family history of adverse reaction to anesthesia    mom gets nauseated   Foot pain, bilateral 05/22/2018   GERD (gastroesophageal reflux disease)    takes Pepcid daily   History of blood clots    left leg 3-37yr ago   Hyperlipidemia    Hypertension    Hypertriglyceridemia    Inguinal hernia, left 01/2015   Muscle spasm    Open wound of genital labia    Peripheral neuropathy    RBBB    [place]iz[place] ([facility]    [place]epsis [place] 01/19/2018   Sinus tachycardia    a. persistent since 2009.   Smokers' cough ([place]    Status post mastectomy, right  01/14/2020   Stroke ([facility]   left sided weakness   TIA (transient ischemic attack)    Tobacco abuse    Vulvar abscess 01/23/2018   Past Surgical History:  Procedure Laterality Date   AMPUTATION Left 10/11/2021   Procedure: AMPUTATION BELOW KNEE;  Surgeon: DAlgernon Huxley MD;  [place];  Service: General;  Laterality: Left;   AMPUTATION Right 11/15/2021   Procedure: BELOW KNEE AMPUTATION;  Surgeon: SKatha Cabal MD;  Location: [facility];  Service: Vascular;  Laterality: Right;   APPLICATION OF WOUND VAC Right 01/29/2020   Procedure: APPLICATION OF WOUND VAC;  Surgeon: RRonny Bacon MD;  [facility];  Service: General;  Laterality: Right;   APPLICATION OF WOUND VAC Left 11/15/2021   Procedure: APPLICATION OF WOUND VAC;  Surgeon: SKatha Cabal MD;  Location: [facility];  Service: Vascular;  Laterality: Left;   BREAST BIOPSY Right 11/06/2019   u[place] path pending venus clip   GRAFT APPLICATION  31/61/0960  Procedure: GRAFT APPLICATION;  Surgeon: EEdrick Kins DPM;  Location: [facility];  Service: Podiatry;;   HERNIA REPAIR Left    I & D EXTREMITY Left 11/05/2021   Procedure: IRRIGATION AND DEBRIDEMENT LEFT BELOW KNEE STUMP;  Surgeon: SKatha Cabal MD;  Location: [facility];  Service: Vascular;  Laterality: Left;   INCISION AND DRAINAGE Bilateral 09/21/2021   Procedure: INCISION AND DRAINAGE;  Surgeon: EEdrick Kins DPM;  Location: [facility];  Service: Podiatry;  Laterality: Bilateral;   INCISION AND DRAINAGE ABSCESS N/A 01/26/2018   Procedure: INCISION AND DEBRIDEMENT OF VULVAR NECROTIZING SOFT TISSUE INFECTION;  Surgeon: WGreer Pickerel MD;  [facility]  Service: General;  Laterality: N/A;   INCISION AND DRAINAGE ABSCESS N/A 07/21/2021   Procedure: INCISION AND DRAINAGE ABSCESS Scalp;  Surgeon: BRobert Bellow MD;  [place];  Service: General;  Laterality: N/A;  scalp and right abdomen   INCISION AND DRAINAGE PERIRECTAL ABSCESS N/A 01/22/2018    Procedure: IRRIGATION AND DEBRIDEMENT LABIAL/VULVAR AREA;  Surgeon: BCoralie Keens MD;  [place] [facility]  Service: General;  Laterality: N/A;   INCISION AND DRAINAGE PERIRECTAL ABSCESS N/A 01/29/2018   Procedure: IRRIGATION AND DEBRIDEMENT VULVA;  Surgeon: HExcell Seltzer MD;  [place] [facility]  Service: General;  Laterality: N/A;   INGUINAL HERNIA REPAIR Left 04/08/2015   Procedure: OPEN LEFT INGUINAL HERNIA REPAIR WITH MESH;  Surgeon: ARalene Ok MD;  [place] [facility]  Service: General;  Laterality: Left;   INSERTION OF MESH Left 04/08/2015   Procedure: INSERTION OF MESH;  Surgeon: ARalene Ok MD;  [place] [facility]  Service: General;  Laterality: Left;   IRRIGATION AND DEBRIDEMENT ABSCESS N/A 07/21/2021   Procedure: IRRIGATION AND DEBRIDEMENT ABSCESS abdomen;  Surgeon: BRobert Bellow MD;  Location: [facility];  Service: General;  Laterality: N/A;   LAPAROSCOPY     Endometriosis   LEFT HEART CATHETERIZATION WITH CORONARY ANGIOGRAM N/A 12/05/2013   Procedure: LEFT HEART CATHETERIZATION WITH CORONARY ANGIOGRAM;  Surgeon: Jettie Booze, MD;  Location: [facility] CATH LAB;  Service: Cardiovascular;  Laterality: N/A;   LOWER EXTREMITY ANGIOGRAPHY Left 09/22/2021   Procedure: Lower Extremity Angiography;  Surgeon: Katha Cabal, MD;  Location: [place] CV LAB;  Service: Cardiovascular;  Laterality: Left;   LOWER EXTREMITY ANGIOGRAPHY Right 11/09/2021   Procedure: Lower Extremity Angiography;  Surgeon: Katha Cabal, MD;  Location: [place] CV LAB;  Service: Cardiovascular;  Laterality: Right;   MASTECTOMY Right    MINOR APPLICATION OF WOUND VAC Left 11/05/2021   Procedure: MINOR APPLICATION OF WOUND VAC;  Surgeon: Katha Cabal, MD;  Location: [facility];  Service: Vascular;  Laterality: Left;   right kidney drained     SIMPLE MASTECTOMY WITH AXILLARY SENTINEL NODE BIOPSY Right 12/23/2019   Procedure: Right SIMPLE MASTECTOMY WITH AXILLARY SENTINEL NODE BIOPSY;   Surgeon: Ronny Bacon, MD;  Location: [facility];  Service: General;  Laterality: Right;   TEE WITHOUT CARDIOVERSION N/A 11/28/2013   Procedure: TRANSESOPHAGEAL ECHOCARDIOGRAM (TEE);  Surgeon: Thayer Headings, MD;  Location: [place];  Service: Cardiovascular;  Laterality: N/A;   TEE WITHOUT CARDIOVERSION N/A 10/13/2021   Procedure: TRANSESOPHAGEAL ECHOCARDIOGRAM (TEE);  Surgeon: Corey Skains, MD;  Location: [facility];  Service: Cardiovascular;  Laterality: N/A;   WOUND DEBRIDEMENT Right 01/29/2020   Procedure: DEBRIDEMENT WOUND, Excisional debridement skin, subcutaneous and muscle right chest wall;  Surgeon: Ronny Bacon, MD;  Location: [facility];  Service: General;  Laterality: Right;   Social History:  reports that she has been smoking cigarettes. She started smoking about 40 years ago. She has a 9.00 pack-year smoking history. She has never used smokeless tobacco. She reports that she does not drink alcohol and does not use drugs.  Allergies  Allergen Reactions   Adhesive [Tape] Rash and Other (See Comments)    TAKES OFF THE SKIN (CERTAIN MEDICAL TAPES DO THIS!!)   Metoprolol Shortness Of Breath    Occurrence of shortness of breath after 3 days   Montelukast Shortness Of Breath   Morphine Sulfate Anaphylaxis, Shortness Of Breath and Nausea And Vomiting    Swollen Throat - Able to tolerate dilaudid   Penicillins Anaphylaxis, Hives and Shortness Of Breath    Throat swells Has patient had a PCN reaction causing immediate rash, facial/tongue/throat swelling, SOB or lightheadedness with hypotension: Yes Has patient had a PCN reaction causing severe rash involving mucus membranes or skin necrosis: No Has patient had a PCN reaction that required hospitalization: Yes Has patient had a PCN reaction occurring within the last 10 years: No If all of the above answers are "NO", then may proceed with Cephalosporin use.    Silicone Rash    Takes off the skin (certain medical tapes do this)    Diltiazem Swelling   Doxycycline Hives   Gabapentin Swelling   Midodrine     Lightheaded and falling down   Percocet [Oxycodone-Acetaminophen] Rash    Family History  Problem Relation Age of Onset   Venous thrombosis Brother    Other Brother        BRAIN TUMOR   Asthma Father    Diabetes Father    Coronary artery disease Mother    Hypertension Mother    Diabetes Mother    Breast cancer Mother 25  Asthma Sister    Diabetes type II Brother    Diabetes Brother     Prior to Admission medications   Medication Sig Start Date End Date Taking? Authorizing Provider  acetaminophen (TYLENOL) 325 MG tablet Take 2 tablets (650 mg total) by mouth every 4 (four) hours as needed for headache or mild pain. 09/23/21  Yes Dwyane Dee, MD  albuterol (VENTOLIN HFA) 108 (90 Base) MCG/ACT inhaler Inhale 2 puffs into the lungs every 6 (six) hours as needed for wheezing or shortness of breath.   Yes [provider]  ALPRAZolam Duanne Moron) 1 MG tablet Take 1 tablet (1 mg total) by mouth 4 (four) times daily as needed for anxiety. 10/14/21  Yes [name], MD  aspirin EC 81 MG tablet Take 81 mg by mouth daily before breakfast.   Yes [provider]  busPIRone (BUSPAR) 30 MG tablet Take 30 mg by mouth 2 (two) times daily.   Yes [provider]  citalopram (CELEXA) 20 MG tablet Take 1 tablet (20 mg total) by mouth daily. 12/17/19  Yes Cammie Sickle, MD  desvenlafaxine (PRISTIQ) 100 MG 24 hr tablet Take 100 mg by mouth daily.   Yes [provider]  insulin aspart (NOVOLOG) 100 UNIT/ML FlexPen Inject 1-9 Units into the skin 4 (four) times daily -  before meals and at bedtime. Glucose 121 - 150: 1 unit, Glucose 151 - 200: 2 units, Glucose 201 - 250: 3 units, Glucose 251 - 300: 5 units, Glucose 301 - 350: 7 units, Glucose 351 - 400: 9 units, Glucose > 400 call MD 09/23/21  Yes Dwyane Dee, MD  insulin glargine (LANTUS) 100 UNIT/ML Solostar Pen Inject 35 Units into the  skin daily. 10/14/21  Yes [name], MD  ondansetron (ZOFRAN) 8 MG tablet One pill every 8 hours as needed for nausea/vomitting. 09/10/21  Yes Cammie Sickle, MD  oxyCODONE (OXY IR/ROXICODONE) 5 MG immediate release tablet Take 1 tablet (5 mg total) by mouth every 6 (six) hours as needed for severe pain. 12/02/21  Yes Schnier, Dolores Lory, MD  OXYGEN Inhale 2-4 L into the lungs 2 (two) times daily as needed (shortness of breath).   Yes [provider]  pregabalin (LYRICA) 300 MG capsule TAKE 1 CAPSULE BY MOUTH TWICE DAILY FOR  NERVE  PAIN 10/22/21  Yes Pleas Koch, NP  rOPINIRole (REQUIP) 1 MG tablet TAKE 1 TABLET BY MOUTH EVERY DAY AT BEDTIME FOR  RESTLESS  LEG 08/16/21  Yes Pleas Koch, NP  rosuvastatin (CRESTOR) 10 MG tablet Take 1 tablet (10 mg total) by mouth at bedtime. 10/14/21  Yes [name], MD  spironolactone (ALDACTONE) 50 MG tablet Take 50 mg by mouth daily. 02/16/21  Yes [provider]  sulfamethoxazole-trimethoprim (BACTRIM DS) 800-160 MG tablet Take 1 tablet by mouth 2 (two) times daily for 7 days. 12/11/21 12/18/21 Yes Vanna Scotland, MD  topiramate (TOPAMAX) 50 MG tablet TAKE 1 TABLET BY MOUTH TWICE DAILY FOR HEADACHE PREVENTION 12/03/21  Yes Pleas Koch, NP  traZODone (DESYREL) 100 MG tablet Take 200 mg by mouth at bedtime. 11/08/18  Yes [provider]  anastrozole (ARIMIDEX) 1 MG tablet Take 1 tablet (1 mg total) by mouth daily. Patient not taking: Reported on 11/04/2021 03/02/21   Cammie Sickle, MD  budesonide-formoterol (SYMBICORT) 80-4.5 MCG/ACT inhaler INHALE 2 PUFFS BY MOUTH EVERY 12 HOURS TO PREVENT COUGH OR WHEEZING *RINSE MOUTH AFTER EACH USE* Patient not taking: Reported on 12/09/2021 12/01/20   Carlis Abbott,  Leticia Penna, NP  EYLEA 2 MG/0.05ML SOSY  03/10/21   [provider]    Physical Exam: Vitals:   12/12/21 1948 12/12/21 1949  BP: (!) 112/58   Pulse: 78   Resp: 15   Temp: 97.8 F (36.6 C)   TempSrc: Oral   SpO2:  94%   Weight:  100 kg  Height:  5' 4"  (1.626 m)   1.  General: Patient lying supine in bed,  no acute distress, appearing older than stated age   31. Psychiatric: Alert and oriented x 3, mood and behavior normal for situation, pleasant and cooperative with exam   3. Neurologic: Speech and language are normal, face is symmetric, moves all 4 extremities voluntarily, at baseline without acute deficits on limited exam   4. HEENMT:  Head is atraumatic, normocephalic, pupils reactive to light, neck is supple, trachea is midline, mucous membranes are moist   5. Respiratory : Lungs are clear to auscultation bilaterally without wheezing, rhonchi, rales, no cyanosis, no increase in work of breathing or accessory muscle use   6. Cardiovascular : Heart rate normal, rhythm is regular, no murmurs, rubs or gallops, peripheral pulses palpated   7. Gastrointestinal:  Abdomen is soft, nondistended, nontender to palpation bowel sounds active, no masses or organomegaly palpated   8. Skin:  Skin is warm, dry and intact without rashes, acute lesions, or ulcers on limited exam   9.Musculoskeletal:  Right BKA with every other staple intact, minimal venous stasis changes with 2+ pitting edema at the stump.  No purulent drainage.  Left BKA is with wound VAC no pain to palpation.  Data Reviewed: In the ED Temp 97.8, heart rate 78, respiratory rate 15, blood pressure 112/58, satting 94% No leukocytosis with a white blood cell count of 5.9, hemoglobin 8.4, platelets 235 Chemistry is unremarkable aside from an alk phos of 137, albumin 2.5 Lactic acid is normal at 1.0 Previous blood cultures from June 1 reviewed with MRSA with sensitivity to clindamycin and vancomycin Repeat blood cultures drawn today EKG shows a heart rate of 77, sinus rhythm, QTc 487 with right bundle Patient was started on vancomycin Admission requested for further management of bacteremia  Assessment and Plan: * Bacteremia - Blood  cultures from June 1 grew MRSA -Repeat blood cultures drawn today -Patient started on vancomycin, continue vancomycin -Source of infection is most likely left lower extremity wound infection -Continue to monitor  Uncontrolled type 2 diabetes mellitus with hyperglycemia, with long-term current use of insulin (HCC) - On 35 units of basal insulin and sliding scale at home -Continue 28 units of basal insulin and moderate sliding scale -Carb modified diet -Last hemoglobin A1c was 10.1 -Monitor CBGs  Tobacco abuse - Patient reports smoking 5 cigarettes/day -She does request nicotine patch for cravings -7 mg nicotine patch ordered -Patient is working on quitting  Protein-calorie malnutrition, moderate (HCC) - Albumin 2.5 -Encourage nutrient dense food choices -Continue to monitor  Wound infection - Left BKA -Wound VAC in place -Was discharged on Bactrim -Blood cultures came back positive for MRSA -Started on vancomycin in the ED -Continue vancomycin per pharmacy consult -Repeat blood cultures drawn today -Does not meet sepsis criteria -Continue to monitor  Depression with anxiety - Continue Xanax, BuSpar, Celexa, Pristiq, trazodone -Patient reports feeling anxious and is already requesting Xanax -Continue to monitor  Hyperlipidemia associated with type 2 diabetes mellitus (HCC) - Continue Crestor  Restless leg syndrome - Continue Requip      Advance Care Planning:  Code Status: Prior DNR  Consults: Wound nurse  Family Communication: No family at bedside  Severity of Illness: The appropriate patient status for this patient is INPATIENT. Inpatient status is judged to be reasonable and necessary in order to provide the required intensity of service to ensure the patient's safety. The patient's presenting symptoms, physical exam findings, and initial radiographic and laboratory data in the context of their chronic comorbidities is felt to place them at high risk for  further clinical deterioration. Furthermore, it is not anticipated that the patient will be medically stable for discharge from the hospital within 2 midnights of admission.   * I certify that at the point of admission it is my clinical judgment that the patient will require inpatient hospital care spanning beyond 2 midnights from the point of admission due to high intensity of service, high risk for further deterioration and high frequency of surveillance required.*  Author: Rolla Plate, DO 12/12/2021 10:04 PM  For on call review [email or web address].

## 2021-12-12 NOTE — ED Triage Notes (Signed)
Pt to ED via [facility] from home  for positive blood cultures from previous admission (12/11/21). Pt was admitted for sepsis of LLE wound. Per pt, a doctor called pt and stated she had positive blood cultures and needs to come back in.  Pt has current wound vac on LLE Pt has bilateral Below knee amputations.   Pt denies fever, SOB, chills, CP at this time.  Pt able to answer questions appropriately, A&ox4.

## 2021-12-12 NOTE — Progress Notes (Signed)
Patient wound culture resulted 12/12/21:    Methicillin resistant staphylococcus aureus    MIC    CIPROFLOXACIN >=8 RESISTANT  Resistant    CLINDAMYCIN >=8 RESISTANT  Resistant    ERYTHROMYCIN >=8 RESISTANT  Resistant    GENTAMICIN >=16 RESIST... Resistant    Inducible Clindamycin NEGATIVE  Sensitive    OXACILLIN >=4 RESISTANT  Resistant    RIFAMPIN <=0.5 SENSI... Sensitive    TETRACYCLINE >=16 RESIST... Resistant    TRIMETH/SULFA 160 RESISTANT  Resistant    VANCOMYCIN 1 SENSITIVE  Sensitive     D/w ID on call who recommended returning to hospital for IV antibiotics, as patient on chronic SSRI medication and initiating Linezolid would be risk for serotonin syndrome. D/w patient spouse Kennith Center who stated he would return to the hospital. Understood risk of worsening infection death if the infection is not controlled appropriately.

## 2021-12-12 NOTE — Assessment & Plan Note (Signed)
-   Left BKA -Wound VAC in place -Was discharged on Bactrim -Blood cultures came back positive for MRSA -Started on vancomycin in the ED -Continue vancomycin per pharmacy consult -Repeat blood cultures drawn today -Does not meet sepsis criteria -Continue to monitor

## 2021-12-12 NOTE — Assessment & Plan Note (Signed)
No evidence of MRSA bacteremia. Wound culture from prior admission came back positive for MRSA.  Blood culture from 6/1 actually is negative. Discussed with ID. Patient will be started on IV daptomycin, IV ceftriaxone and IV Flagyl as the wound appears to be foul-smelling with discharge. Vascular surgery will come and evaluate the patient further. We will follow-up on recommendation. Currently holding off on PICC line although patient will be informed PICC line due to having difficulty with IV access and needing IV antibiotics. Appreciate ID and vascular surgery assistance. Wound care consulted and wound VAC in place.

## 2021-12-12 NOTE — Progress Notes (Signed)
Pharmacy Antibiotic Note  [name] is a 51 y.o. female admitted on 12/12/2021 with bacteremia.  Pharmacy has been consulted for Vancomycin dosing.  Plan: Pt ordered initial dose of vancomycin 2 gm. Vancomycin 1500 mg IV Q 24 hrs.  Goal AUC 400-550. Expected AUC: 456.9 SCr used: 0.77 Vd used: 0.5, BMI: 37.84  Pharmacy will continue to follow and will adjust abx dosing whenever warranted.   Height: '5\' 4"'$  (162.6 cm) Weight: 100 kg (220 lb 7.4 oz) IBW/kg (Calculated) : 54.7  Temp (24hrs), Avg:97.8 F (36.6 C), Min:97.8 F (36.6 C), Max:97.8 F (36.6 C)  Recent Labs  Lab 12/09/21 1323 12/10/21 0740 12/11/21 0432 12/12/21 2005  WBC 6.7 5.0  --  5.9  CREATININE 0.73 0.75 0.87 0.77  LATICACIDVEN 0.9  --   --  1.0    Estimated Creatinine Clearance: 94.5 mL/min (by C-G formula based on SCr of 0.77 mg/dL).    Allergies  Allergen Reactions   Adhesive [Tape] Rash and Other (See Comments)    TAKES OFF THE SKIN (CERTAIN MEDICAL TAPES DO THIS!!)   Metoprolol Shortness Of Breath    Occurrence of shortness of breath after 3 days   Montelukast Shortness Of Breath   Morphine Sulfate Anaphylaxis, Shortness Of Breath and Nausea And Vomiting    Swollen Throat - Able to tolerate dilaudid   Penicillins Anaphylaxis, Hives and Shortness Of Breath    Throat swells Has patient had a PCN reaction causing immediate rash, facial/tongue/throat swelling, SOB or lightheadedness with hypotension: Yes Has patient had a PCN reaction causing severe rash involving mucus membranes or skin necrosis: No Has patient had a PCN reaction that required hospitalization: Yes Has patient had a PCN reaction occurring within the last 10 years: No If all of the above answers are "NO", then may proceed with Cephalosporin use.    Silicone Rash    Takes off the skin (certain medical tapes do this)   Diltiazem Swelling   Doxycycline Hives   Gabapentin Swelling   Midodrine     Lightheaded and falling down    Percocet [Oxycodone-Acetaminophen] Rash    Antimicrobials this admission: 6/04 Vancomycin >>  Microbiology results: 6/04 BCx: Pending  Thank you for allowing pharmacy to be a part of this patient's care.  Renda Rolls, PharmD, [facility] 12/12/2021 9:52 PM

## 2021-12-12 NOTE — Assessment & Plan Note (Signed)
Continue Crestor 

## 2021-12-12 NOTE — ED Provider Notes (Signed)
[facility] Provider Note    Event Date/Time   First MD Initiated Contact with Patient 12/12/21 2012     (approximate)  History   Chief Complaint: Blood Infection (Cultures + from previous admission )  HPI  [name] is a 51 y.o. female with multiple medical issues, most recently discharged yesterday, per discharge summary patient was admitted for a left amputation/stump infection.  Patient was treated with antibiotics ultimately discharged home.  Patient was called today stating that her blood cultures grew back positive.  I reviewed the blood culture sensitivities which appears to be sensitive to IV Vanco and rifampin.  Patient has no acute complaints.  Denies any fever.  Patient has a wound VAC in place on the left lower extremity.  Physical Exam   Triage Vital Signs: ED Triage Vitals  Enc Vitals Group     BP 12/12/21 1948 (!) 112/58     Pulse Rate 12/12/21 1948 78     Resp 12/12/21 1948 15     Temp 12/12/21 1948 97.8 F (36.6 C)     Temp Source 12/12/21 1948 Oral     SpO2 12/12/21 1948 94 %     Weight 12/12/21 1949 220 lb 7.4 oz (100 kg)     Height 12/12/21 1949 '5\' 4"'$  (1.626 m)     Head Circumference --      Peak Flow --      Pain Score 12/12/21 1949 0     Pain Loc --      Pain Edu? --      Excl. in [place]? --     Most recent vital signs: Vitals:   12/12/21 1948  BP: (!) 112/58  Pulse: 78  Resp: 15  Temp: 97.8 F (36.6 C)  SpO2: 94%    General: Awake, no distress.  CV:  Good peripheral perfusion.  Regular rate and rhythm  Resp:  Normal effort.  Equal breath sounds bilaterally.  Abd:  No distention.  Soft, nontender.  No rebound or guarding. Other:  Bilateral BKA, staples in place in the right BKA which was performed approximate 1 month ago per patient.  More recent left BKA currently has a wound VAC on.   ED Results / Procedures / Treatments   EKG  EKG viewed and interpreted by myself shows a normal sinus rhythm at 77 bpm with a  narrow QRS, normal axis, normal intervals, no concerning ST changes.   MEDICATIONS ORDERED IN ED: Medications  vancomycin (VANCOCIN) IVPB 1000 mg/200 mL premix (has no administration in time range)     IMPRESSION / MDM / ASSESSMENT AND PLAN / ED COURSE  I reviewed the triage vital signs and the nursing notes.  Patient's presentation is most consistent with acute presentation with potential threat to life or bodily function.  Patient presents emergency department for positive blood cultures called back by her doctor to return to the emergency department.  I reviewed the patient's discharge summary from yesterday as well as the sensitivities.  Patient has a wound VAC on her left lower extremity, we will leave in place.  We will recheck labs, cultures including lactic acid.  We will start the patient on IV vancomycin admit to the hospitalist service.  Patient's labs show CBC with a hemoglobin 8.4 which is slightly increased from historical values.  Chemistry shows no concerning findings.  Lactic acid of 1.0.  We will admit to the hospital service for ongoing treatment.  FINAL CLINICAL IMPRESSION(S) / ED DIAGNOSES  Positive blood culture Bacteremia  Note:  This document was prepared using Dragon voice recognition software and may include unintentional dictation errors.   Harvest Dark, MD 12/12/21 2113

## 2021-12-12 NOTE — Assessment & Plan Note (Signed)
Last hemoglobin A1c was 10.1. Continuing sliding scale insulin and basal bolus regimen.

## 2021-12-12 NOTE — Assessment & Plan Note (Signed)
-   Continue Xanax, BuSpar, Celexa, Pristiq, trazodone -Patient reports feeling anxious and is already requesting Xanax -Continue to monitor

## 2021-12-12 NOTE — Assessment & Plan Note (Signed)
Continue Requip. 

## 2021-12-12 NOTE — Assessment & Plan Note (Signed)
-   Patient reports smoking 5 cigarettes/day -She does request nicotine patch for cravings -7 mg nicotine patch ordered -Patient is working on quitting

## 2021-12-13 ENCOUNTER — Other Ambulatory Visit (INDEPENDENT_AMBULATORY_CARE_PROVIDER_SITE_OTHER): Payer: Self-pay | Admitting: Vascular Surgery

## 2021-12-13 ENCOUNTER — Ambulatory Visit (INDEPENDENT_AMBULATORY_CARE_PROVIDER_SITE_OTHER): Payer: Medicare Other | Admitting: Nurse Practitioner

## 2021-12-13 DIAGNOSIS — A4902 Methicillin resistant Staphylococcus aureus infection, unspecified site: Secondary | ICD-10-CM | POA: Diagnosis not present

## 2021-12-13 DIAGNOSIS — T8744 Infection of amputation stump, left lower extremity: Secondary | ICD-10-CM | POA: Diagnosis not present

## 2021-12-13 LAB — COMPREHENSIVE METABOLIC PANEL
ALT: 6 U/L (ref 0–44)
AST: 11 U/L — ABNORMAL LOW (ref 15–41)
Albumin: 2.5 g/dL — ABNORMAL LOW (ref 3.5–5.0)
Alkaline Phosphatase: 126 U/L (ref 38–126)
Anion gap: 3 — ABNORMAL LOW (ref 5–15)
BUN: 15 mg/dL (ref 6–20)
CO2: 26 mmol/L (ref 22–32)
Calcium: 8.9 mg/dL (ref 8.9–10.3)
Chloride: 109 mmol/L (ref 98–111)
Creatinine, Ser: 0.66 mg/dL (ref 0.44–1.00)
GFR, Estimated: >60 mL/min (ref >60)
Glucose, Bld: 111 mg/dL — ABNORMAL HIGH (ref 70–99)
Potassium: 3.9 mmol/L (ref 3.5–5.1)
Sodium: 138 mmol/L (ref 135–145)
Total Bilirubin: 0.6 mg/dL (ref 0.3–1.2)
Total Protein: 8 g/dL (ref 6.5–8.1)

## 2021-12-13 LAB — CBC WITH DIFFERENTIAL/PLATELET
Abs Immature Granulocytes: 0.04 10*3/uL (ref 0.00–0.07)
Basophils Absolute: 0.1 10*3/uL (ref 0.0–0.1)
Basophils Relative: 1 %
Eosinophils Absolute: 0.3 10*3/uL (ref 0.0–0.5)
Eosinophils Relative: 5 %
HCT: 26.1 % — ABNORMAL LOW (ref 36.0–46.0)
Hemoglobin: 7.6 g/dL — ABNORMAL LOW (ref 12.0–15.0)
Immature Granulocytes: 1 %
Lymphocytes Relative: 18 %
Lymphs Abs: 1.1 10*3/uL (ref 0.7–4.0)
MCH: 24.1 pg — ABNORMAL LOW (ref 26.0–34.0)
MCHC: 29.1 g/dL — ABNORMAL LOW (ref 30.0–36.0)
MCV: 82.6 fL (ref 80.0–100.0)
Monocytes Absolute: 0.6 10*3/uL (ref 0.1–1.0)
Monocytes Relative: 10 %
Neutro Abs: 4 10*3/uL (ref 1.7–7.7)
Neutrophils Relative %: 65 %
Platelets: 241 10*3/uL (ref 150–400)
RBC: 3.16 MIL/uL — ABNORMAL LOW (ref 3.87–5.11)
RDW: 17 % — ABNORMAL HIGH (ref 11.5–15.5)
WBC: 6.1 10*3/uL (ref 4.0–10.5)
nRBC: 0 % (ref 0.0–0.2)

## 2021-12-13 LAB — GLUCOSE, CAPILLARY
Glucose-Capillary: 90 mg/dL (ref 70–99)
Glucose-Capillary: 137 mg/dL — ABNORMAL HIGH (ref 70–99)
Glucose-Capillary: 158 mg/dL — ABNORMAL HIGH (ref 70–99)
Glucose-Capillary: 175 mg/dL — ABNORMAL HIGH (ref 70–99)

## 2021-12-13 LAB — MAGNESIUM: Magnesium: 2.1 mg/dL (ref 1.7–2.4)

## 2021-12-13 LAB — CK: Total CK: 16 U/L — ABNORMAL LOW (ref 38–234)

## 2021-12-13 MED ORDER — SODIUM CHLORIDE 0.9 % IV SOLN
8.0000 mg/kg | Freq: Every day | INTRAVENOUS | Status: DC
  Administered 2021-12-13: 600 mg via INTRAVENOUS
  Filled 2021-12-13 (×2): qty 12

## 2021-12-13 MED ORDER — METRONIDAZOLE 500 MG PO TABS
500.0000 mg | ORAL_TABLET | Freq: Two times a day (BID) | ORAL | Status: DC
  Administered 2021-12-13 – 2021-12-14 (×3): 500 mg via ORAL
  Filled 2021-12-13 (×4): qty 1

## 2021-12-13 MED ORDER — SODIUM CHLORIDE 0.9 % IV SOLN
2.0000 g | INTRAVENOUS | Status: DC
  Administered 2021-12-13 – 2021-12-14 (×2): 2 g via INTRAVENOUS
  Filled 2021-12-13: qty 20
  Filled 2021-12-13: qty 2

## 2021-12-13 MED ORDER — OXYCODONE HCL 5 MG PO TABS
5.0000 mg | ORAL_TABLET | ORAL | Status: DC | PRN
  Administered 2021-12-13 – 2021-12-14 (×2): 5 mg via ORAL
  Filled 2021-12-13 (×2): qty 1

## 2021-12-13 MED ORDER — VANCOMYCIN HCL 1500 MG/300ML IV SOLN
1500.0000 mg | INTRAVENOUS | Status: DC
  Filled 2021-12-13: qty 300

## 2021-12-13 NOTE — Consult Note (Addendum)
[facility] Nurse Consult Note: Reason for Consult: Consult requested to change Vac dressing.  Pt has been wearing an Acelity home Vac machine prior to admission and it is currently attached and charged. She plans for probable discharge today, so I will not plan to switch to a hospital machine for this reason. Medicated for pain prior to the procedure and she tolerated with minimal amt discomfort.  Wound type: Left stump with full thickness post-op full thickness wound.  8X12X.2cm, 95% beefy red, 5% yellow slough, small amt bloody drainage when previous dressing was removed.  Applied one piece of "cinnamon roll type" Vac dressing and drape; patient brought supplies from home and states she has a skin reaction to the other type of Vac drape when it has been applied in the past.  Cont suction on at 151m.  Applied ace wrap over dressing as requested by patient.  She plans to resume dressing changes with home health after discharge.  Left lower inner stump with full thickness wound; 2X2cm with dry eschar.  It is best practice to leave dry stable eschar in place.  Pt should follow-up with orthopedic surgeon after discharge. DJulien GirtMSN, RN, [place]
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
## 2021-12-13 NOTE — Progress Notes (Signed)
Date of Admission:  12/12/2021   ID: [name] is a 51 y.o. female  Principal Problem:   Infection of wound due to methicillin resistant Staphylococcus aureus (MRSA) Active Problems:   Tobacco abuse   Restless leg syndrome   Uncontrolled type 2 diabetes mellitus with hyperglycemia, with long-term current use of insulin (HCC)   Hyperlipidemia associated with type 2 diabetes mellitus ([place])   Depression with anxiety   Wound infection   Protein-calorie malnutrition, moderate (HCC)  Pt went home on Saturday and was called back due to resisatnt MRSA causing infection of the left BKA stump and lack of PO antibiotics as linezolid cannot be used because of her being on SSRI.  Subjective: Patient states she is feeling better No fever  Medications:   aspirin EC  81 mg Oral QAC breakfast   busPIRone  30 mg Oral BID   citalopram  20 mg Oral Daily   heparin  5,000 Units Subcutaneous Q8H   insulin aspart  0-15 Units Subcutaneous TID WC   insulin aspart  0-5 Units Subcutaneous QHS   insulin detemir  28 Units Subcutaneous QHS   metroNIDAZOLE  500 mg Oral Q12H   nicotine  7 mg Transdermal Daily   pregabalin  300 mg Oral BID   rosuvastatin  10 mg Oral QHS   spironolactone  50 mg Oral Daily   traZODone  200 mg Oral QHS   venlafaxine XR  150 mg Oral Q breakfast    Objective: Vital signs in last 24 hours: Temp:  [97.6 F (36.4 C)-98.7 F (37.1 C)] 97.8 F (36.6 C) (06/05 2005) Pulse Rate:  [65-88] 72 (06/05 2005) Resp:  [11-20] 18 (06/05 2005) BP: (95-141)/(50-80) 138/64 (06/05 2005) SpO2:  [94 %-98 %] 97 % (06/05 2005)   PHYSICAL EXAM:  General: Alert, cooperative, no distress, appears stated age.  Head: Normocephalic, without obvious abnormality, atraumatic. Eyes: Conjunctivae clear, anicteric sclerae. Pupils are equal ENT Nares normal. No drainage or sinus tenderness. Edentulous Neck: Supple, symmetrical, no adenopathy, thyroid: non tender no carotid bruit and no JVD. Back:  No CVA tenderness. Lungs: Clear to auscultation bilaterally. No Wheezing or Rhonchi. No rales. Heart: Regular rate and rhythm, no murmur, rub or gallop. Abdomen: Soft, non-tender,not distended. Bowel sounds normal. No masses Extremities: Left BKA stump now covered with a wound VAC On 6/23 the wound was beefy red but had a glazed appearance.  There was foul-smelling greenish discharge.     skin: No rashes or lesions. Or bruising Lymph: Cervical, supraclavicular normal. Neurologic: Grossly non-focal  Lab Results Recent Labs    12/12/21 2005 12/13/21 0524  WBC 5.9 6.1  HGB 8.4* 7.6*  HCT 29.1* 26.1*  NA 139 138  K 4.2 3.9  CL 107 109  CO2 26 26  BUN 16 15  CREATININE 0.77 0.66   Liver Panel Recent Labs    12/12/21 2005 12/13/21 0524  PROT 8.6* 8.0  ALBUMIN 2.5* 2.5*  AST 12* 11*  ALT 8 6  ALKPHOS 137* 126  BILITOT 1.0 0.6    Microbiology: Culture has methicillin-resistant Staph aureus and Proteus    Assessment/Plan: Infected left BKA stump with MRSA and Proteus Patient was discharged over the weekend but had to be called back because of lack of p.o. antibiotics except linezolid which she could not take because of multiple SSRI use As it is MRSA we need to make sure that there is no residual infection.  We will get an x-ray to make sure there is  no osteomyelitis Discussed with vascular to see whether she will need further debridement Patient is currently on Vanco ceftriaxone and Flagyl we will change vancomycin to daptomycin She is going to need IV antibiotics on discharge.  It would be daptomycin 8 mg/kg body weight and may be IV ceftriaxone. If no further intervention is needed as per vascular we can place OPAT orders and insert PICC . Patient on multiple antidepressants including BuSpar, Celexa, trazodone, venlafaxine  Right BKA site is well approximated  Diabetes mellitus on insulin  CA breast status post right mastectomy on  anastrozole  Anemia  Diabetes mellitus on insulin  Discussed the management with the patient and the care team.

## 2021-12-13 NOTE — Progress Notes (Signed)
  Progress Note Patient: [name] LNL:[id number] DOB: May 21, 1971 DOA: 12/12/2021  DOS: the patient was seen and examined on 12/13/2021  Brief hospital course: 51 year old female with past medical history of HLD, HTN, type II DM, MRSA bacteremia, obesity, bilateral BKA presents to the hospital with complaints of purulent discharge coming from the right BKA. Was hospitalized on 11/04/2021 until 11/20/2021. Hospitalized again from 12/09/2021 to 12/11/2021. Blood cultures performed on 6/1 are actually negative. Wound culture on 6/3 were actually positive for MRSA.  Based on ID recommendation patient was requested to come back to the hospital to receive IV antibiotics and further evaluation from vascular surgery. Assessment and Plan: * Infection of wound due to methicillin resistant Staphylococcus aureus (MRSA) No evidence of MRSA bacteremia. Wound culture from prior admission came back positive for MRSA.  Blood culture from 6/1 actually is negative. Discussed with ID. Patient will be started on IV daptomycin, IV ceftriaxone and IV Flagyl as the wound appears to be foul-smelling with discharge. Vascular surgery will come and evaluate the patient further. We will follow-up on recommendation. Currently holding off on PICC line although patient will be informed PICC line due to having difficulty with IV access and needing IV antibiotics. Appreciate ID and vascular surgery assistance. Wound care consulted and wound VAC in place.  Uncontrolled type 2 diabetes mellitus with hyperglycemia, with long-term current use of insulin (HCC) Last hemoglobin A1c was 10.1. Continuing sliding scale insulin and basal bolus regimen.  Tobacco abuse - Patient reports smoking 5 cigarettes/day -She does request nicotine patch for cravings -7 mg nicotine patch ordered -Patient is working on quitting  Protein-calorie malnutrition, moderate ([place]) Continue protein supplements.  Depression with anxiety - Continue Xanax,  BuSpar, Celexa, Pristiq, trazodone -Patient reports feeling anxious and is already requesting Xanax -Continue to monitor  Hyperlipidemia associated with type 2 diabetes mellitus (HCC) - Continue Crestor  Restless leg syndrome - Continue Requip  Subjective: continue to have pain in the wound, no fever no chill.  Physical Exam: Vitals:   12/13/21 0530 12/13/21 0641 12/13/21 1000 12/13/21 1553  BP: 132/68 116/72 122/80 (!) 95/50  Pulse: 67 83 88 71  Resp: '13 20 18 16  '$ Temp:  98 F (36.7 C) 98.7 F (37.1 C) 97.8 F (36.6 C)  TempSrc:   Oral Oral  SpO2: 96% 94% 95% 98%  Weight:      Height:       General: Appear in mild distress; no visible Abnormal Neck Mass Or lumps, Conjunctiva normal Cardiovascular: S1 and S2 Present, no Murmur, Respiratory: good respiratory effort, Bilateral Air entry present and CTA, no Crackles, no wheezes Abdomen: Bowel Sound present, Non tender Extremities: bilateral BKA Neurology: alert and oriented to time, place, and person  Gait not checked due to patient safety concerns   Data Reviewed: I have Reviewed nursing notes, Vitals, and Lab results since pt's last encounter. Pertinent lab results CBC BMP I have ordered test including CBC BMP    Family Communication: none at bed side  Disposition: Status is: Inpatient Remains inpatient appropriate because: needing IV Antibiotics  Author: Berle Mull, MD 12/13/2021 6:47 PM  Please look on [email or web address] to find out who is on call.

## 2021-12-13 NOTE — ED Notes (Signed)
Pt placed on 1.5 L [place], pt states she wears O2 at home while sleeping

## 2021-12-13 NOTE — Hospital Course (Signed)
51 year old female with past medical history of HLD, HTN, type II DM, MRSA bacteremia, obesity, bilateral BKA presents to the hospital with complaints of purulent discharge coming from the right BKA. Was hospitalized on 11/04/2021 until 11/20/2021. Hospitalized again from 12/09/2021 to 12/11/2021. Blood cultures performed on 6/1 are actually negative. Wound culture on 6/3 were actually positive for MRSA.  Based on ID recommendation patient was requested to come back to the hospital to receive IV antibiotics and further evaluation from vascular surgery.

## 2021-12-14 ENCOUNTER — Inpatient Hospital Stay: Payer: BC Managed Care – PPO

## 2021-12-14 DIAGNOSIS — A4902 Methicillin resistant Staphylococcus aureus infection, unspecified site: Secondary | ICD-10-CM | POA: Diagnosis not present

## 2021-12-14 LAB — GLUCOSE, CAPILLARY
Glucose-Capillary: 143 mg/dL — ABNORMAL HIGH (ref 70–99)
Glucose-Capillary: 264 mg/dL — ABNORMAL HIGH (ref 70–99)
Glucose-Capillary: 140 mg/dL — ABNORMAL HIGH (ref 70–99)

## 2021-12-14 LAB — CULTURE, BLOOD (ROUTINE X 2)
Culture: NO GROWTH
Culture: NO GROWTH
Special Requests: ADEQUATE

## 2021-12-14 LAB — CBC
HCT: 27.5 % — ABNORMAL LOW (ref 36.0–46.0)
Hemoglobin: 8.2 g/dL — ABNORMAL LOW (ref 12.0–15.0)
MCH: 24.3 pg — ABNORMAL LOW (ref 26.0–34.0)
MCHC: 29.8 g/dL — ABNORMAL LOW (ref 30.0–36.0)
MCV: 81.4 fL (ref 80.0–100.0)
Platelets: 248 10*3/uL (ref 150–400)
RBC: 3.38 MIL/uL — ABNORMAL LOW (ref 3.87–5.11)
RDW: 17.2 % — ABNORMAL HIGH (ref 11.5–15.5)
WBC: 6.7 10*3/uL (ref 4.0–10.5)
nRBC: 0 % (ref 0.0–0.2)

## 2021-12-14 LAB — AEROBIC/ANAEROBIC CULTURE W GRAM STAIN (SURGICAL/DEEP WOUND): Gram Stain: NONE SEEN

## 2021-12-14 LAB — VITAMIN B12: Vitamin B-12: 423 pg/mL (ref 180–914)

## 2021-12-14 NOTE — Progress Notes (Signed)
Pt left AMA with husband. Pt signed paper on day shift and husband arrived and transported pt himself to chair and car.ama copy given to husband and pt.

## 2021-12-14 NOTE — Progress Notes (Signed)
[facility]  Daily Progress Note   Subjective  -   [name] is a 51 year old female with past medical history of hyperlipidemia, hypertension, poorly controlled diabetes mellitus, and bilateral below-knee amputations that returns to the hospital with concern for wound infection.  She initially presented on 12/10/2021 with complaints of a foul-smelling odor from her wound.  Upon removing the wound vacuum it was noted that the patient had some redness in the periskin area but the actual wound bed itself was red, beefy with good granulation tissue.  She has some areas of fibrinous exudate however there is no purulent tissue noted.  The wound was evaluated jointly with Dr. Lucky Cowboy.  It was noted that the patient had a small necrotic area on the medial area however this did not have any noted drainage and it was not felt that this was an abscess or a cavity that communicated towards her bone.  At that time cultures were taken and she recently had positive wound cultures come back for MRSA.  Blood cultures taken at time have been negative.  She denies any fevers or chills.  It was noted that prior to admission to the hospital the patient's wound was improving significantly with abscess cavity decreasing from 4 cm to 2 cm.  Objective Vitals:   12/13/21 0641 12/13/21 1000 12/13/21 1553 12/13/21 2005  BP: 116/72 122/80 (!) 95/50 138/64  Pulse: 83 88 71 72  Resp: '20 18 16 18  '$ Temp: 98 F (36.7 C) 98.7 F (37.1 C) 97.8 F (36.6 C) 97.8 F (36.6 C)  TempSrc:  Oral Oral Oral  SpO2: 94% 95% 98% 97%  Weight:      Height:        Intake/Output Summary (Last 24 hours) at 12/14/2021 0046 Last data filed at 12/13/2021 1559 Gross per 24 hour  Intake 102.58 ml  Output 1550 ml  Net -1447.42 ml    PULM  CTAB CV  RRR VASC  bilateral below-knee amputations with large wound to right below-knee amputation  Laboratory CBC    Component Value Date/Time   WBC 6.1 12/13/2021 0524   HGB  7.6 (L) 12/13/2021 0524   HGB 12.2 09/16/2021 0847   HCT 26.1 (L) 12/13/2021 0524   HCT 38.0 09/16/2021 0847   PLT 241 12/13/2021 0524   PLT 200 09/16/2021 0847    BMET    Component Value Date/Time   NA 138 12/13/2021 0524   NA 137 01/08/2018 1117   K 3.9 12/13/2021 0524   CL 109 12/13/2021 0524   CO2 26 12/13/2021 0524   GLUCOSE 111 (H) 12/13/2021 0524   BUN 15 12/13/2021 0524   BUN 31 (H) 01/08/2018 1117   CREATININE 0.66 12/13/2021 0524   CREATININE 1.59 (H) 07/18/2019 1506   CALCIUM 8.9 12/13/2021 0524   GFRNONAA >60 12/13/2021 0524   GFRAA 51 (L) 02/25/2020 1504    Assessment/Planning: I had a long discussion with the patient in regards to options for her right below-knee amputation.  The the patient has grown positive wound cultures however she is not bacteremic at this time.  The wound itself is granulating well and is showing progress.  The abscess cavity has decreased from 4 cm to 2 cm with use of the wound vacuum.  However, infection within the wound itself can delay wound healing and if the patient is unable to completely resolve the infection and the wound, it may deteriorate.    We discussed that with good wound healing  currently the patient is several months away from wound healing currently.  She likely has another 4 to 6 weeks with wound VAC and then she will need a skin graft due to the large surface area of the wound.  Infection in the wound would pose a risk if not resolved when she has a skin graft.  If normal, if wound healing goes without any significant issues or delays it would likely be at least 2 to 3 months of wound healing and care before wound is completely healed.  We discussed that an above-knee amputation would allow the patient to heal much faster.  Given the fact that the patient is a bilateral amputee, her ability to resume walking will be difficult whether she has an above-knee amputation or below-knee amputation.  Revising her stump to an above-knee  amputation will also decrease her risk of possibly becoming bacteremic due to a lingering infection.  Following this discussion, the patient would like to continue to try with healing the wound as it is considered undergoing further procedures.  We will continue to watch closely and if there are signs and symptoms of deterioration of wound or worsening infection we will reapproach above-knee amputation.    [name]  12/14/2021, 12:46 AM

## 2021-12-14 NOTE — Progress Notes (Signed)
Notified Dr. Posey Pronto and [facility] that patient is wanting to leave AMA due to a family emergency, she asked if ambulance transport will be set up and will she have any orders from the provider. Per TOC transport will not be arranged per hospital policy. Per Dr. Posey Pronto he did not recommend patient leaving, no new orders at this time. Per patient her husband will come pick her up and transport her home.

## 2021-12-14 NOTE — TOC Progression Note (Signed)
Transition of Care [facility]) - Progression Note    Patient Details  Name: [name] MRN: [id number] Date of Birth: 01/26/1971  Transition of Care [facility]) CM/SW [place], LCSW Phone Number: 12/14/2021, 4:33 PM  Clinical Narrative: Per RN, patient wants to leave AMA and is requesting EMS transport home. Per TOC policy, unable to set up transport for a patient leaving AMA. RN is aware. Have updated [facility] and [facility] representatives. MD entered home health orders for PT, OT, RN.    Expected Discharge Plan: [place] Barriers to Discharge: Continued Medical Work up  Expected Discharge Plan and Services Expected Discharge Plan: [place] Choice: Resumption of Svcs/PTA Provider Living arrangements for the past 2 months: Single Family Home                           HH Arranged: RN, PT, OT [facility] Agency: Other - See comment ([place]) Date [place]: 12/13/21   Representative spoke with at [place]: [place] ([place]) Interventions    Readmission Risk Interventions    12/14/2021   10:32 AM  Readmission Risk Prevention Plan  Medication Review (RN Care Manager) Complete  PCP or Specialist appointment within 3-5 days of discharge Complete  HRI or [place] Complete  SW Recovery Care/Counseling Consult Complete  [place] Not Applicable

## 2021-12-14 NOTE — Progress Notes (Addendum)
ID Pt has left BKA stump infection with MRSA, proteus and getting IV dapto, ceftriaxone and flagyl Xray left stump shows possible fibula osteomyelitis Pt has to get MRI But she wants to leave as she has to get her dogs out she says Spoke to her -She knows she has to get Iv antibiotics and MRI She says she will get it done as OP She is leaving AMA Discussed with care team

## 2021-12-14 NOTE — TOC Progression Note (Signed)
Transition of Care [facility]) - Progression Note    Patient Details  Name: [name] MRN: [id number] Date of Birth: 1971/07/07  Transition of Care [facility]) CM/SW [place], LCSW Phone Number: 12/14/2021, 10:36 AM  Clinical Narrative:    CSW met with Pt and partner at pt's bedside. Pt sees Alma Friendly for all of her primary care needs. Pt does not have any issues getting her medications. Pt utilizes [employer] [facility] [address] in [place]. Pt uses the following equipment at home: Jazzy, [facility] Bed, and a wheel chair. Pt will need EMS transport at discharge. The family is looking for transportation to get pt to and from appointments. CSW provided the family with [employer] information in [place].    Expected Discharge Plan: [place] Barriers to Discharge: Continued Medical Work up  Expected Discharge Plan and Services Expected Discharge Plan: [place] Choice: Resumption of Svcs/PTA Provider Living arrangements for the past 2 months: Single Family Home                           HH Arranged: RN, PT, OT [facility] Agency: Other - See comment ([place]) Date [place]: 12/13/21   Representative spoke with at [place]: [place] ([place]) Interventions    Readmission Risk Interventions    12/14/2021   10:32 AM  Readmission Risk Prevention Plan  Medication Review (RN Care Manager) Complete  PCP or Specialist appointment within 3-5 days of discharge Complete  HRI or [place] Complete  SW Recovery Care/Counseling Consult Complete  [place] Not Applicable

## 2021-12-14 NOTE — Discharge Summary (Signed)
[facility] Discharge Summary   Patient: [name] EAV:[id number]   PCP: Pleas Koch, NP DOB: 1971-01-31   Date of admission: 12/12/2021   Date of discharge: 12/14/2021    Discharge Diagnoses:  Principal Problem:   Infection of wound due to methicillin resistant Staphylococcus aureus (MRSA) Active Problems:   Uncontrolled type 2 diabetes mellitus with hyperglycemia, with long-term current use of insulin (HCC)   Tobacco abuse   Restless leg syndrome   Hyperlipidemia associated with type 2 diabetes mellitus ([place])   Depression with anxiety   Wound infection   Protein-calorie malnutrition, moderate ([name])  Discharge Condition: patient left [facility]  Hospital Course:  51 year old female with past medical history of HLD, HTN, type II DM, MRSA bacteremia, obesity, bilateral BKA presents to the hospital with complaints of purulent discharge coming from the right BKA. Was hospitalized on 11/04/2021 until 11/20/2021. Hospitalized again from 12/09/2021 to 12/11/2021. Blood cultures performed on 6/1 are actually negative. Wound culture on 6/3 were actually positive for MRSA.  Based on ID recommendation patient was requested to come back to the hospital to receive IV antibiotics and further evaluation from vascular surgery.  On 6/6 patient tells me that she wants to go home due to family emergency but She will be calling the nonemergency transport for transport. Home health ordered.  Continue wound VAC on leaving the hospital. ID will arrange for outpatient PICC line placement for the patient.  patient left AMA  Procedures and Results: none   Consultations: ID Vascular surgery  The results of significant diagnostics from this hospitalization (including imaging, microbiology, ancillary and laboratory) are listed below for reference.    Significant Diagnostic Studies: DG Knee 1-2 Views Left  Result Date: 12/14/2021 CLINICAL DATA:  Amputation stump infection. EXAM: LEFT KNEE - 1-2 VIEW  COMPARISON:  November 04, 2021 FINDINGS: Post below the knee amputation. There has been callus formation at the amputation sites of the tibia and fibula. Suspected osteolytic changes at the attention site of the fibula, suspicious for osteomyelitis. No free gas within the soft tissues is seen. IMPRESSION: 1. Status post below the knee amputation with callus formation. 2. Suspected osteolytic changes at the amputation site of the fibula, suspicious for osteomyelitis. 3. No free gas within the soft tissues. 4. These results will be called to the ordering clinician or representative by the Radiologist Assistant, and communication documented in the PACS or [employer]. Electronically Signed   By: Fidela Salisbury M.D.   On: 12/14/2021 10:41    Microbiology: Recent Results (from the past 240 hour(s))  Blood culture (routine x 2)     Status: None   Collection Time: 12/09/21  1:23 PM   Specimen: BLOOD  Result Value Ref Range Status   Specimen Description BLOOD LEFT ARM  Final   Special Requests   Final    BOTTLES DRAWN AEROBIC AND ANAEROBIC Blood Culture adequate volume   Culture   Final    NO GROWTH 5 DAYS Performed at [facility], [address], [place], [place] [address]    Report Status 12/14/2021 FINAL  Final  Blood culture (routine x 2)     Status: None   Collection Time: 12/09/21  2:27 PM   Specimen: BLOOD  Result Value Ref Range Status   Specimen Description BLOOD BLLA  Final   Special Requests BOTTLES DRAWN AEROBIC AND ANAEROBIC BCAV  Final   Culture   Final    NO GROWTH 5 DAYS Performed at [facility], [place]  [address], [place], [place] [address]    Report Status 12/14/2021 FINAL  Final  Aerobic/Anaerobic Culture w Gram Stain (surgical/deep wound)     Status: None   Collection Time: 12/09/21  2:38 PM   Specimen: Wound  Result Value Ref Range Status   Specimen Description   Final    WOUND Performed at [facility], [address],  [place], [place] [address]    Special Requests   Final    NONE Performed at [facility], [address] [place], [place] [address]    Gram Stain   Final    NO SQUAMOUS EPITHELIAL CELLS SEEN FEW WBC SEEN RARE GRAM POSITIVE COCCI    Culture   Final    FEW METHICILLIN RESISTANT STAPHYLOCOCCUS AUREUS FEW GROUP A STREP (S.PYOGENES) ISOLATED Beta hemolytic streptococci are predictably susceptible to penicillin and other beta lactams. Susceptibility testing not routinely performed. RARE PROTEUS MIRABILIS NO ANAEROBES ISOLATED Performed at [facility], [place] [address] [place], [place] [address]    Report Status 12/14/2021 FINAL  Final   Organism ID, Bacteria METHICILLIN RESISTANT STAPHYLOCOCCUS AUREUS  Final   Organism ID, Bacteria PROTEUS MIRABILIS  Final      Susceptibility   Methicillin resistant staphylococcus aureus - MIC*    CIPROFLOXACIN >=8 RESISTANT Resistant     ERYTHROMYCIN >=8 RESISTANT Resistant     GENTAMICIN >=16 RESISTANT Resistant     OXACILLIN >=4 RESISTANT Resistant     TETRACYCLINE >=16 RESISTANT Resistant     VANCOMYCIN 1 SENSITIVE Sensitive     TRIMETH/SULFA 160 RESISTANT Resistant     CLINDAMYCIN >=8 RESISTANT Resistant     RIFAMPIN <=0.5 SENSITIVE Sensitive     Inducible Clindamycin NEGATIVE Sensitive     * FEW METHICILLIN RESISTANT STAPHYLOCOCCUS AUREUS   Proteus mirabilis - MIC*    AMPICILLIN <=2 SENSITIVE Sensitive     CEFAZOLIN <=4 SENSITIVE Sensitive     CEFEPIME <=0.12 SENSITIVE Sensitive     CEFTAZIDIME <=1 SENSITIVE Sensitive     CEFTRIAXONE <=0.25 SENSITIVE Sensitive     CIPROFLOXACIN 2 RESISTANT Resistant     GENTAMICIN <=1 SENSITIVE Sensitive     IMIPENEM 2 SENSITIVE Sensitive     TRIMETH/SULFA <=20 SENSITIVE Sensitive     AMPICILLIN/SULBACTAM <=2 SENSITIVE Sensitive     PIP/TAZO <=4 SENSITIVE Sensitive     * RARE PROTEUS MIRABILIS  Blood culture (routine x 2)     Status: None (Preliminary result)   Collection Time: 12/12/21   8:05 PM   Specimen: BLOOD  Result Value Ref Range Status   Specimen Description BLOOD LEFT AC  Final   Special Requests   Final    BOTTLES DRAWN AEROBIC AND ANAEROBIC Blood Culture results may not be optimal due to an excessive volume of blood received in culture bottles   Culture   Final    NO GROWTH 2 DAYS Performed at [facility], [address] [place], [place] [address]    Report Status PENDING  Incomplete  Blood culture (routine x 2)     Status: None (Preliminary result)   Collection Time: 12/12/21  9:08 PM   Specimen: BLOOD  Result Value Ref Range Status   Specimen Description BLOOD LEFT HAND  Final   Special Requests   Final    BOTTLES DRAWN AEROBIC AND ANAEROBIC Blood Culture adequate volume   Culture   Final    NO GROWTH 2 DAYS Performed at [facility], [address], [place], [place] [address]  Report Status PENDING  Incomplete     Labs: CBC: Recent Labs  Lab 12/09/21 1323 12/10/21 0740 12/12/21 2005 12/13/21 0524 12/14/21 0800  WBC 6.7 5.0 5.9 6.1 6.7  NEUTROABS  --   --   --  4.0  --   HGB 8.1* 7.7* 8.4* 7.6* 8.2*  HCT 27.5* 25.8* 29.1* 26.1* 27.5*  MCV 82.6 81.9 83.4 82.6 81.4  PLT 231 225 235 241 283   Basic Metabolic Panel: Recent Labs  Lab 12/09/21 1323 12/10/21 0740 12/11/21 0432 12/12/21 2005 12/13/21 0524  NA 136 137  --  139 138  K 3.9 4.0  --  4.2 3.9  CL 105 107  --  107 109  CO2 23 25  --  26 26  GLUCOSE 173* 176*  --  142* 111*  BUN 16 18  --  16 15  CREATININE 0.73 0.75 0.87 0.77 0.66  CALCIUM 8.6* 8.4*  --  8.7* 8.9  MG  --   --   --   --  2.1   Liver Function Tests: Recent Labs  Lab 12/09/21 1323 12/10/21 0740 12/12/21 2005 12/13/21 0524  AST 11* 8* 12* 11*  ALT '7 7 8 6  '$ ALKPHOS 134* 121 137* 126  BILITOT 1.1 0.8 1.0 0.6  PROT 8.6* 7.8 8.6* 8.0  ALBUMIN 2.5* 2.3* 2.5* 2.5*   No results for input(s): LIPASE, AMYLASE in the last 168 hours. No results for input(s): AMMONIA in the last 168  hours. Cardiac Enzymes: Recent Labs  Lab 12/13/21 0524  CKTOTAL 16*   BNP (last 3 results) Recent Labs    09/20/21 1426 10/15/21 0954  BNP 88.0 162.7*   CBG: Recent Labs  Lab 12/13/21 1705 12/13/21 2039 12/14/21 0737 12/14/21 1119 12/14/21 1614  GLUCAP 158* 175* 143* 264* 140*   Time spent: 30 minutes  Signed:  Berle Mull  [facility] 12/14/2021, 7:46 PM

## 2021-12-15 ENCOUNTER — Other Ambulatory Visit (HOSPITAL_COMMUNITY): Payer: Self-pay

## 2021-12-15 ENCOUNTER — Other Ambulatory Visit: Payer: Self-pay | Admitting: Infectious Diseases

## 2021-12-15 ENCOUNTER — Telehealth: Payer: Self-pay | Admitting: Pharmacist

## 2021-12-15 ENCOUNTER — Telehealth: Payer: Self-pay | Admitting: Primary Care

## 2021-12-15 MED ORDER — NUZYRA 150 MG PO TABS
ORAL_TABLET | ORAL | 0 refills | Status: DC
  Filled 2021-12-15: qty 54, 26d supply, fill #0

## 2021-12-15 NOTE — Telephone Encounter (Signed)
Patient returning call re: got out of the hospital last night and was following up with [place]  Please call the patient at [phone]

## 2021-12-15 NOTE — TOC Benefit Eligibility Note (Signed)
Patient Teacher, English as a foreign language completed.    The patient is currently admitted and upon discharge could be taking Nuzyra 150 mg tablets.  The current 30 day co-pay is, $0.00.   The patient is insured through [place] Patient Advocate Specialist [place] Patient Advocate Team Direct Number: [phone]  Fax: [phone]

## 2021-12-15 NOTE — Progress Notes (Unsigned)
Pharmacy - Antimicrobial Stewardship  Patient left AMA 6/6 due to emergency at home and need to care for her dogs.   After discussion with ID, plan to investigate omadacycline PO.  Copay was $0 and [facility] had tablets in stock (#54).  Prescription called in for 26 day supply.  Called Patient and gave her ID follow-up appointment information (December 28, 2021 at 11am)  Told her Omadacycline Elesa Hacker) would be mailed to her home (most likely on 6/8).   Gave her instructions on how to take the antibiotic (family member wrote down instructions) Day #1 3 tabs ('450mg'$ ) taken at same time Day #2 3 tabs ('450mg'$ ) taken at same time Day #3 and beyond 2 tabs ('300mg'$ ) daily Instructed must take on empty stomach.  No food or drink (beside water) 4 hr before dose then wait at least 2h after taking med to eat or drink besides water (wait 4hr to eat dairy after taking med).   Reviewed common side effects: ex.  N/V/D and phototoxicity  Doreene [place], PharmD, BCPS, BCIDP Work Cell: [phone] 12/15/2021 3:13 PM

## 2021-12-16 ENCOUNTER — Telehealth: Payer: Self-pay

## 2021-12-16 DIAGNOSIS — Z89511 Acquired absence of right leg below knee: Secondary | ICD-10-CM

## 2021-12-16 NOTE — Telephone Encounter (Signed)
Spoke to patient needs to have assistance in transportation. I have reviewed with Anda Kraft and we are ok to do virtual if patient is able to do so. If not we will need to get with amy to arrange transportation.   Called patient back to make appointment l/m to call office we need to set up mid to late July

## 2021-12-16 NOTE — Telephone Encounter (Signed)
Called patient she has all home health set up. She does not need anything further on this.

## 2021-12-16 NOTE — Telephone Encounter (Signed)
Pt has staples to remove as well, she said she thought [facility] was going to do it but they said they had have to do it. She wasn't sure what to do. [facility] number if needed is [phone]

## 2021-12-16 NOTE — Telephone Encounter (Signed)
Patient called in asking if we can change her wound bandages. Patient is due now for the change and does not know what to do.

## 2021-12-16 NOTE — Telephone Encounter (Signed)
Left message to return call to our office.  

## 2021-12-17 ENCOUNTER — Telehealth (INDEPENDENT_AMBULATORY_CARE_PROVIDER_SITE_OTHER): Payer: Self-pay

## 2021-12-17 DIAGNOSIS — J42 Unspecified chronic bronchitis: Secondary | ICD-10-CM | POA: Diagnosis not present

## 2021-12-17 DIAGNOSIS — E1169 Type 2 diabetes mellitus with other specified complication: Secondary | ICD-10-CM | POA: Diagnosis not present

## 2021-12-17 DIAGNOSIS — Z8673 Personal history of transient ischemic attack (TIA), and cerebral infarction without residual deficits: Secondary | ICD-10-CM | POA: Diagnosis not present

## 2021-12-17 DIAGNOSIS — F419 Anxiety disorder, unspecified: Secondary | ICD-10-CM | POA: Diagnosis not present

## 2021-12-17 DIAGNOSIS — Z87891 Personal history of nicotine dependence: Secondary | ICD-10-CM | POA: Diagnosis not present

## 2021-12-17 DIAGNOSIS — E1151 Type 2 diabetes mellitus with diabetic peripheral angiopathy without gangrene: Secondary | ICD-10-CM | POA: Diagnosis not present

## 2021-12-17 DIAGNOSIS — I5032 Chronic diastolic (congestive) heart failure: Secondary | ICD-10-CM | POA: Diagnosis not present

## 2021-12-17 DIAGNOSIS — E782 Mixed hyperlipidemia: Secondary | ICD-10-CM | POA: Diagnosis not present

## 2021-12-17 DIAGNOSIS — I130 Hypertensive heart and chronic kidney disease with heart failure and stage 1 through stage 4 chronic kidney disease, or unspecified chronic kidney disease: Secondary | ICD-10-CM | POA: Diagnosis not present

## 2021-12-17 DIAGNOSIS — K219 Gastro-esophageal reflux disease without esophagitis: Secondary | ICD-10-CM | POA: Diagnosis not present

## 2021-12-17 DIAGNOSIS — I4510 Unspecified right bundle-branch block: Secondary | ICD-10-CM | POA: Diagnosis not present

## 2021-12-17 DIAGNOSIS — I872 Venous insufficiency (chronic) (peripheral): Secondary | ICD-10-CM | POA: Diagnosis not present

## 2021-12-17 DIAGNOSIS — Z4781 Encounter for orthopedic aftercare following surgical amputation: Secondary | ICD-10-CM | POA: Diagnosis not present

## 2021-12-17 LAB — CULTURE, BLOOD (ROUTINE X 2)
Culture: NO GROWTH
Culture: NO GROWTH
Special Requests: ADEQUATE

## 2021-12-17 NOTE — Telephone Encounter (Signed)
[name] from [facility] health made aware with orders for wound vac and staple removal

## 2021-12-17 NOTE — Telephone Encounter (Signed)
Left message to return call to our office.  

## 2021-12-17 NOTE — Telephone Encounter (Signed)
Continue wound vac change 3 x weekly

## 2021-12-18 DIAGNOSIS — E1151 Type 2 diabetes mellitus with diabetic peripheral angiopathy without gangrene: Secondary | ICD-10-CM | POA: Diagnosis not present

## 2021-12-18 DIAGNOSIS — J42 Unspecified chronic bronchitis: Secondary | ICD-10-CM | POA: Diagnosis not present

## 2021-12-18 DIAGNOSIS — I130 Hypertensive heart and chronic kidney disease with heart failure and stage 1 through stage 4 chronic kidney disease, or unspecified chronic kidney disease: Secondary | ICD-10-CM | POA: Diagnosis not present

## 2021-12-18 DIAGNOSIS — E1169 Type 2 diabetes mellitus with other specified complication: Secondary | ICD-10-CM | POA: Diagnosis not present

## 2021-12-18 DIAGNOSIS — I5032 Chronic diastolic (congestive) heart failure: Secondary | ICD-10-CM | POA: Diagnosis not present

## 2021-12-18 DIAGNOSIS — F419 Anxiety disorder, unspecified: Secondary | ICD-10-CM | POA: Diagnosis not present

## 2021-12-18 DIAGNOSIS — I4510 Unspecified right bundle-branch block: Secondary | ICD-10-CM | POA: Diagnosis not present

## 2021-12-18 DIAGNOSIS — E782 Mixed hyperlipidemia: Secondary | ICD-10-CM | POA: Diagnosis not present

## 2021-12-18 DIAGNOSIS — Z87891 Personal history of nicotine dependence: Secondary | ICD-10-CM | POA: Diagnosis not present

## 2021-12-18 DIAGNOSIS — Z4781 Encounter for orthopedic aftercare following surgical amputation: Secondary | ICD-10-CM | POA: Diagnosis not present

## 2021-12-18 DIAGNOSIS — Z8673 Personal history of transient ischemic attack (TIA), and cerebral infarction without residual deficits: Secondary | ICD-10-CM | POA: Diagnosis not present

## 2021-12-18 DIAGNOSIS — K219 Gastro-esophageal reflux disease without esophagitis: Secondary | ICD-10-CM | POA: Diagnosis not present

## 2021-12-18 DIAGNOSIS — I872 Venous insufficiency (chronic) (peripheral): Secondary | ICD-10-CM | POA: Diagnosis not present

## 2021-12-20 NOTE — Telephone Encounter (Signed)
Patient returned call, states [facility] came out on Saturday and changed bandage and told her that she was scheduled for Monday, Wednesday and Friday but checked in with them this morning and was advised that she was not scheduled but would contact her tomorrow.

## 2021-12-20 NOTE — Telephone Encounter (Signed)
Patient would like to know if she can get a bed pan to collect urine, patient wasn't sure what it was called but she is having a hard time getting up and going to the bathroom. States that the hospital advised her to contact us for the order.

## 2021-12-21 ENCOUNTER — Inpatient Hospital Stay: Payer: BC Managed Care – PPO | Admitting: Infectious Diseases

## 2021-12-21 ENCOUNTER — Telehealth (INDEPENDENT_AMBULATORY_CARE_PROVIDER_SITE_OTHER): Payer: Self-pay | Admitting: Nurse Practitioner

## 2021-12-21 DIAGNOSIS — F419 Anxiety disorder, unspecified: Secondary | ICD-10-CM | POA: Diagnosis not present

## 2021-12-21 DIAGNOSIS — J449 Chronic obstructive pulmonary disease, unspecified: Secondary | ICD-10-CM | POA: Diagnosis not present

## 2021-12-21 DIAGNOSIS — I130 Hypertensive heart and chronic kidney disease with heart failure and stage 1 through stage 4 chronic kidney disease, or unspecified chronic kidney disease: Secondary | ICD-10-CM | POA: Diagnosis not present

## 2021-12-21 DIAGNOSIS — I872 Venous insufficiency (chronic) (peripheral): Secondary | ICD-10-CM | POA: Diagnosis not present

## 2021-12-21 DIAGNOSIS — E1122 Type 2 diabetes mellitus with diabetic chronic kidney disease: Secondary | ICD-10-CM | POA: Diagnosis not present

## 2021-12-21 DIAGNOSIS — E1169 Type 2 diabetes mellitus with other specified complication: Secondary | ICD-10-CM | POA: Diagnosis not present

## 2021-12-21 DIAGNOSIS — I5032 Chronic diastolic (congestive) heart failure: Secondary | ICD-10-CM | POA: Diagnosis not present

## 2021-12-21 DIAGNOSIS — T8744 Infection of amputation stump, left lower extremity: Secondary | ICD-10-CM | POA: Diagnosis not present

## 2021-12-21 DIAGNOSIS — F319 Bipolar disorder, unspecified: Secondary | ICD-10-CM | POA: Diagnosis not present

## 2021-12-21 DIAGNOSIS — E1151 Type 2 diabetes mellitus with diabetic peripheral angiopathy without gangrene: Secondary | ICD-10-CM | POA: Diagnosis not present

## 2021-12-21 DIAGNOSIS — Z4781 Encounter for orthopedic aftercare following surgical amputation: Secondary | ICD-10-CM | POA: Diagnosis not present

## 2021-12-21 DIAGNOSIS — E782 Mixed hyperlipidemia: Secondary | ICD-10-CM | POA: Diagnosis not present

## 2021-12-21 DIAGNOSIS — N1831 Chronic kidney disease, stage 3a: Secondary | ICD-10-CM | POA: Diagnosis not present

## 2021-12-21 NOTE — Telephone Encounter (Signed)
Spoke to patient no further information needed at this time.

## 2021-12-21 NOTE — Telephone Encounter (Signed)
I have spoke to patient she is aware that [name] is out of the office. I am trying to find out what we need to do to have orders signed. Patient is requesting order for purewick. [facility] has forms and would like to touch base with you 6/14 about this

## 2021-12-21 NOTE — Telephone Encounter (Signed)
Left message to return call to our office.  

## 2021-12-21 NOTE — Telephone Encounter (Signed)
Pt returned call would like a call back [phone]

## 2021-12-21 NOTE — Telephone Encounter (Signed)
Return call to patient and LVM on 12/21/21

## 2021-12-22 ENCOUNTER — Telehealth: Payer: Self-pay | Admitting: Primary Care

## 2021-12-22 DIAGNOSIS — I5032 Chronic diastolic (congestive) heart failure: Secondary | ICD-10-CM | POA: Diagnosis not present

## 2021-12-22 DIAGNOSIS — F319 Bipolar disorder, unspecified: Secondary | ICD-10-CM | POA: Diagnosis not present

## 2021-12-22 DIAGNOSIS — E1122 Type 2 diabetes mellitus with diabetic chronic kidney disease: Secondary | ICD-10-CM | POA: Diagnosis not present

## 2021-12-22 DIAGNOSIS — E1169 Type 2 diabetes mellitus with other specified complication: Secondary | ICD-10-CM | POA: Diagnosis not present

## 2021-12-22 DIAGNOSIS — T8744 Infection of amputation stump, left lower extremity: Secondary | ICD-10-CM | POA: Diagnosis not present

## 2021-12-22 DIAGNOSIS — E782 Mixed hyperlipidemia: Secondary | ICD-10-CM | POA: Diagnosis not present

## 2021-12-22 DIAGNOSIS — J449 Chronic obstructive pulmonary disease, unspecified: Secondary | ICD-10-CM | POA: Diagnosis not present

## 2021-12-22 DIAGNOSIS — Z4781 Encounter for orthopedic aftercare following surgical amputation: Secondary | ICD-10-CM | POA: Diagnosis not present

## 2021-12-22 DIAGNOSIS — N1831 Chronic kidney disease, stage 3a: Secondary | ICD-10-CM | POA: Diagnosis not present

## 2021-12-22 DIAGNOSIS — F419 Anxiety disorder, unspecified: Secondary | ICD-10-CM | POA: Diagnosis not present

## 2021-12-22 DIAGNOSIS — I872 Venous insufficiency (chronic) (peripheral): Secondary | ICD-10-CM | POA: Diagnosis not present

## 2021-12-22 DIAGNOSIS — I130 Hypertensive heart and chronic kidney disease with heart failure and stage 1 through stage 4 chronic kidney disease, or unspecified chronic kidney disease: Secondary | ICD-10-CM | POA: Diagnosis not present

## 2021-12-22 DIAGNOSIS — E1151 Type 2 diabetes mellitus with diabetic peripheral angiopathy without gangrene: Secondary | ICD-10-CM | POA: Diagnosis not present

## 2021-12-22 NOTE — Telephone Encounter (Signed)
Called number provided left information on voicemail.

## 2021-12-22 NOTE — Telephone Encounter (Signed)
Agree with HH orders as requested 

## 2021-12-22 NOTE — Telephone Encounter (Signed)
I reviewed and signed the home health orders in [place] absence. I did not see an order for a purewick. It looks that they have an order in there for her to have a bedside commode though. PT and OT are working with her on that

## 2021-12-22 NOTE — Telephone Encounter (Signed)
OT completed home health assessment  Verbal orders needed for OT  Frequency 1x/wk 8wks (starting today)  Reason: ADL, upper body strength  [phone] (secure line) [facility]

## 2021-12-23 DIAGNOSIS — E1122 Type 2 diabetes mellitus with diabetic chronic kidney disease: Secondary | ICD-10-CM | POA: Diagnosis not present

## 2021-12-23 DIAGNOSIS — E1151 Type 2 diabetes mellitus with diabetic peripheral angiopathy without gangrene: Secondary | ICD-10-CM | POA: Diagnosis not present

## 2021-12-23 DIAGNOSIS — Z4781 Encounter for orthopedic aftercare following surgical amputation: Secondary | ICD-10-CM | POA: Diagnosis not present

## 2021-12-23 DIAGNOSIS — T8744 Infection of amputation stump, left lower extremity: Secondary | ICD-10-CM | POA: Diagnosis not present

## 2021-12-23 DIAGNOSIS — E1169 Type 2 diabetes mellitus with other specified complication: Secondary | ICD-10-CM | POA: Diagnosis not present

## 2021-12-23 DIAGNOSIS — I872 Venous insufficiency (chronic) (peripheral): Secondary | ICD-10-CM | POA: Diagnosis not present

## 2021-12-23 DIAGNOSIS — F419 Anxiety disorder, unspecified: Secondary | ICD-10-CM | POA: Diagnosis not present

## 2021-12-23 DIAGNOSIS — N1831 Chronic kidney disease, stage 3a: Secondary | ICD-10-CM | POA: Diagnosis not present

## 2021-12-23 DIAGNOSIS — E782 Mixed hyperlipidemia: Secondary | ICD-10-CM | POA: Diagnosis not present

## 2021-12-23 DIAGNOSIS — I130 Hypertensive heart and chronic kidney disease with heart failure and stage 1 through stage 4 chronic kidney disease, or unspecified chronic kidney disease: Secondary | ICD-10-CM | POA: Diagnosis not present

## 2021-12-23 DIAGNOSIS — J449 Chronic obstructive pulmonary disease, unspecified: Secondary | ICD-10-CM | POA: Diagnosis not present

## 2021-12-23 DIAGNOSIS — F319 Bipolar disorder, unspecified: Secondary | ICD-10-CM | POA: Diagnosis not present

## 2021-12-23 DIAGNOSIS — I5032 Chronic diastolic (congestive) heart failure: Secondary | ICD-10-CM | POA: Diagnosis not present

## 2021-12-23 NOTE — Telephone Encounter (Signed)
Sent message to [place] to see if they can help

## 2021-12-23 NOTE — Addendum Note (Signed)
Addended by: Michela Pitcher on: 12/23/2021 01:11 PM   Modules accepted: Orders

## 2021-12-23 NOTE — Telephone Encounter (Signed)
Order placed

## 2021-12-23 NOTE — Telephone Encounter (Signed)
Spoke with [place] RN supervisor and advised that orders were faxed over this morning. Asked about purewick, I was advised that this has not been covered by insurance from what she has seen but would need to go through DME place. Spoke with patient, she states she would like to try and see if purewick will be covered. She did call [employer] and was told if provider's office would call them and talk to them about this it should be covered. Patient states she does not have legs anymore and her arms are not strong enough to move herself out of the bed carefully and the other day almost got hurt.  Please place order if this is ok, and I will get in touch with [place] and see if they can help processing this.

## 2021-12-25 DIAGNOSIS — N1831 Chronic kidney disease, stage 3a: Secondary | ICD-10-CM | POA: Diagnosis not present

## 2021-12-25 DIAGNOSIS — E1151 Type 2 diabetes mellitus with diabetic peripheral angiopathy without gangrene: Secondary | ICD-10-CM | POA: Diagnosis not present

## 2021-12-25 DIAGNOSIS — F319 Bipolar disorder, unspecified: Secondary | ICD-10-CM | POA: Diagnosis not present

## 2021-12-25 DIAGNOSIS — F419 Anxiety disorder, unspecified: Secondary | ICD-10-CM | POA: Diagnosis not present

## 2021-12-25 DIAGNOSIS — I5032 Chronic diastolic (congestive) heart failure: Secondary | ICD-10-CM | POA: Diagnosis not present

## 2021-12-25 DIAGNOSIS — I130 Hypertensive heart and chronic kidney disease with heart failure and stage 1 through stage 4 chronic kidney disease, or unspecified chronic kidney disease: Secondary | ICD-10-CM | POA: Diagnosis not present

## 2021-12-25 DIAGNOSIS — J449 Chronic obstructive pulmonary disease, unspecified: Secondary | ICD-10-CM | POA: Diagnosis not present

## 2021-12-25 DIAGNOSIS — E782 Mixed hyperlipidemia: Secondary | ICD-10-CM | POA: Diagnosis not present

## 2021-12-25 DIAGNOSIS — I872 Venous insufficiency (chronic) (peripheral): Secondary | ICD-10-CM | POA: Diagnosis not present

## 2021-12-25 DIAGNOSIS — T8744 Infection of amputation stump, left lower extremity: Secondary | ICD-10-CM | POA: Diagnosis not present

## 2021-12-25 DIAGNOSIS — E1122 Type 2 diabetes mellitus with diabetic chronic kidney disease: Secondary | ICD-10-CM | POA: Diagnosis not present

## 2021-12-25 DIAGNOSIS — E1169 Type 2 diabetes mellitus with other specified complication: Secondary | ICD-10-CM | POA: Diagnosis not present

## 2021-12-25 DIAGNOSIS — Z4781 Encounter for orthopedic aftercare following surgical amputation: Secondary | ICD-10-CM | POA: Diagnosis not present

## 2021-12-27 NOTE — Telephone Encounter (Signed)
Danielle with [facility] followed up with me today and they do not have [facility] to help patient. I called and left patient a detailed message letting her know this and that she would need to check with medical supply places/stores and then we can send this in to her choice of the store. I asked for patient to call us to let us know how to proceed.

## 2021-12-27 NOTE — Telephone Encounter (Signed)
Sent follow up message to [facility] in regards to this today.

## 2021-12-28 ENCOUNTER — Inpatient Hospital Stay: Payer: BC Managed Care – PPO | Admitting: Infectious Diseases

## 2021-12-28 ENCOUNTER — Ambulatory Visit: Payer: BC Managed Care – PPO | Attending: Infectious Diseases | Admitting: Infectious Diseases

## 2021-12-28 DIAGNOSIS — E782 Mixed hyperlipidemia: Secondary | ICD-10-CM | POA: Diagnosis not present

## 2021-12-28 DIAGNOSIS — I872 Venous insufficiency (chronic) (peripheral): Secondary | ICD-10-CM | POA: Diagnosis not present

## 2021-12-28 DIAGNOSIS — A4902 Methicillin resistant Staphylococcus aureus infection, unspecified site: Secondary | ICD-10-CM | POA: Diagnosis not present

## 2021-12-28 DIAGNOSIS — J449 Chronic obstructive pulmonary disease, unspecified: Secondary | ICD-10-CM | POA: Diagnosis not present

## 2021-12-28 DIAGNOSIS — E1169 Type 2 diabetes mellitus with other specified complication: Secondary | ICD-10-CM | POA: Diagnosis not present

## 2021-12-28 DIAGNOSIS — N1831 Chronic kidney disease, stage 3a: Secondary | ICD-10-CM | POA: Diagnosis not present

## 2021-12-28 DIAGNOSIS — Z4781 Encounter for orthopedic aftercare following surgical amputation: Secondary | ICD-10-CM | POA: Diagnosis not present

## 2021-12-28 DIAGNOSIS — F319 Bipolar disorder, unspecified: Secondary | ICD-10-CM | POA: Diagnosis not present

## 2021-12-28 DIAGNOSIS — I5032 Chronic diastolic (congestive) heart failure: Secondary | ICD-10-CM | POA: Diagnosis not present

## 2021-12-28 DIAGNOSIS — F419 Anxiety disorder, unspecified: Secondary | ICD-10-CM | POA: Diagnosis not present

## 2021-12-28 DIAGNOSIS — T8744 Infection of amputation stump, left lower extremity: Secondary | ICD-10-CM | POA: Diagnosis not present

## 2021-12-28 DIAGNOSIS — E1122 Type 2 diabetes mellitus with diabetic chronic kidney disease: Secondary | ICD-10-CM | POA: Diagnosis not present

## 2021-12-28 DIAGNOSIS — E1151 Type 2 diabetes mellitus with diabetic peripheral angiopathy without gangrene: Secondary | ICD-10-CM | POA: Diagnosis not present

## 2021-12-28 DIAGNOSIS — T8740 Infection of amputation stump, unspecified extremity: Secondary | ICD-10-CM

## 2021-12-28 DIAGNOSIS — I130 Hypertensive heart and chronic kidney disease with heart failure and stage 1 through stage 4 chronic kidney disease, or unspecified chronic kidney disease: Secondary | ICD-10-CM | POA: Diagnosis not present

## 2021-12-28 NOTE — Progress Notes (Signed)
The purpose of this virtual visit is to provide medical care while limiting exposure to the novel coronavirus (COVID19) for both patient and office staff.   Consent was obtained for phone visit:  Yes.   Answered questions that patient had about telehealth interaction:  Yes.   I discussed the limitations, risks, security and privacy concerns of performing an evaluation and management service by telephone. I also discussed with the patient that there may be a patient responsible charge related to this service. The patient expressed understanding and agreed to proceed.   Patient Location: Home Provider Location: office Patient on call - Provider, patient, CMA, pts son Pt says she was banned from [facility] but that was not the case. She left AMA on 12/16/30. She had left BKA  stump infection with MRSA. It was decided to treat with Po Omadacycline instead of IV dapto as per her preference. Pt is doing much better Says the left BKA wound is so small that the nurse was having difficulty placing the wound vac Pt has no nausea, vomiting or diarrhea Pt has a vascular appt on Thursday Pt has 12 days of antibiotic left- she will complete that and will not need any more She will get labs when she goes to vascular appt Pt will upload picture of the stump today Total time spent on the call 15 minutes

## 2021-12-28 NOTE — Progress Notes (Unsigned)
The purpose of this virtual visit is to provide medical care while limiting exposure to the novel coronavirus (COVID19) for both patient and office staff.   Consent was obtained for phone visit:  Yes.   Answered questions that patient had about telehealth interaction:  Yes.   I discussed the limitations, risks, security and privacy concerns of performing an evaluation and management service by telephone. I also discussed with the patient that there may be a patient responsible charge related to this service. The patient expressed understanding and agreed to proceed.   Patient Location: Home Provider Location: office Patient on call - Provider, patient, CMA, pts son Pt says she was banned from [facility] but that was not the case. She left AMA on 12/16/30. She had left BKA  stump infection with MRSA. It was decided to treat with Po Omadacycline instead of IV dapto as per her preference. Pt is doing much better Says the left BKA wound is so small that the nurse was having difficulty placing the wound vac Pt has no nausea, vomiting or diarrhea Pt has a vascular appt on Thursday Pt has 12 days of antibiotic left- she will complete that and will not need any more She will get labs when she goes to vascular appt Pt will upload picture of the stump today Total time spent on the call 15 minutes

## 2021-12-29 ENCOUNTER — Other Ambulatory Visit: Payer: Self-pay | Admitting: Primary Care

## 2021-12-29 ENCOUNTER — Telehealth: Payer: Self-pay

## 2021-12-29 DIAGNOSIS — I872 Venous insufficiency (chronic) (peripheral): Secondary | ICD-10-CM | POA: Diagnosis not present

## 2021-12-29 DIAGNOSIS — E1122 Type 2 diabetes mellitus with diabetic chronic kidney disease: Secondary | ICD-10-CM | POA: Diagnosis not present

## 2021-12-29 DIAGNOSIS — T8744 Infection of amputation stump, left lower extremity: Secondary | ICD-10-CM | POA: Diagnosis not present

## 2021-12-29 DIAGNOSIS — E782 Mixed hyperlipidemia: Secondary | ICD-10-CM | POA: Diagnosis not present

## 2021-12-29 DIAGNOSIS — F419 Anxiety disorder, unspecified: Secondary | ICD-10-CM | POA: Diagnosis not present

## 2021-12-29 DIAGNOSIS — G2581 Restless legs syndrome: Secondary | ICD-10-CM

## 2021-12-29 DIAGNOSIS — N1831 Chronic kidney disease, stage 3a: Secondary | ICD-10-CM | POA: Diagnosis not present

## 2021-12-29 DIAGNOSIS — I130 Hypertensive heart and chronic kidney disease with heart failure and stage 1 through stage 4 chronic kidney disease, or unspecified chronic kidney disease: Secondary | ICD-10-CM | POA: Diagnosis not present

## 2021-12-29 DIAGNOSIS — Z4781 Encounter for orthopedic aftercare following surgical amputation: Secondary | ICD-10-CM | POA: Diagnosis not present

## 2021-12-29 DIAGNOSIS — I5032 Chronic diastolic (congestive) heart failure: Secondary | ICD-10-CM | POA: Diagnosis not present

## 2021-12-29 DIAGNOSIS — J449 Chronic obstructive pulmonary disease, unspecified: Secondary | ICD-10-CM | POA: Diagnosis not present

## 2021-12-29 DIAGNOSIS — F319 Bipolar disorder, unspecified: Secondary | ICD-10-CM | POA: Diagnosis not present

## 2021-12-29 DIAGNOSIS — E1169 Type 2 diabetes mellitus with other specified complication: Secondary | ICD-10-CM | POA: Diagnosis not present

## 2021-12-29 DIAGNOSIS — E1151 Type 2 diabetes mellitus with diabetic peripheral angiopathy without gangrene: Secondary | ICD-10-CM | POA: Diagnosis not present

## 2021-12-29 NOTE — Telephone Encounter (Signed)
Home Health verbal orders  Agency Name: [facility]  Requesting Skilled nursing   Reason: Okay to take orders from patients vascular surgeon.   Please forward to [facility] pool or providers CMA

## 2021-12-29 NOTE — Progress Notes (Deleted)
Patient ID: [name], female   DOB: 19-Aug-1970, 51 y.o.   MRN: [id number]  No chief complaint on file.   HPI [name] is a 51 y.o. female.    Patient is status post:                                    1.  left below-knee amputation 10/11/2021                                    2.  Right below-knee amputation 11/15/2021 Left stump site has been complicated with abscess formation and multiple debridements   Past Medical History:  Diagnosis Date   Anxiety    takes Prozac daily   Anxiety    Aortic valve calcification    Asthma    Advair and Spirva daily   Asthma    Bipolar disorder (HCC)    Breast cancer, right breast ([place]) 12/23/2019   CAD (coronary artery disease)    a. LHC 11/2013 done for CP/fluid retention: mild disease in prox LAD, mild-mod disease in mRCA, EF 60% with normal LVEDP. b. Normal nuc 03/2016.   Cancer ([place])    Cellulitis and abscess of trunk 03/12/2020   CHF (congestive heart failure) (HCC)    Chronic diastolic CHF (congestive heart failure) (HCC)    Chronic heart failure with preserved ejection fraction ([place]) 11/16/2018   CKD (chronic kidney disease), stage II    COPD (chronic obstructive pulmonary disease) (HCC)    a. nocturnal O2.   COPD (chronic obstructive pulmonary disease) (HCC)    Coronary artery disease    Decreased urine stream    Diabetes mellitus    Diabetes mellitus without complication (HCC)    Dyspnea    Elevated C-reactive protein (CRP) 01/29/2018   Elevated sedimentation rate 01/29/2018   Elevated serum hCG 01/19/2018   Family history of adverse reaction to anesthesia    mom gets nauseated   Foot pain, bilateral 05/22/2018   GERD (gastroesophageal reflux disease)    takes Pepcid daily   History of blood clots    left leg 3-39yr ago   Hyperlipidemia    Hypertension    Hypertriglyceridemia    Inguinal hernia, left 01/2015   Muscle spasm    Open wound of genital labia    Peripheral neuropathy    RBBB    [place]    [place]epsis  ([place] 01/19/2018   Sinus tachycardia    a. persistent since 2009.   Smokers' cough ([place]    Status post mastectomy, right 01/14/2020   Stroke ([facility] 1989   left sided weakness   TIA (transient ischemic attack)    Tobacco abuse    Vulvar abscess 01/23/2018    Past Surgical History:  Procedure Laterality Date   AMPUTATION Left 10/11/2021   Procedure: AMPUTATION BELOW KNEE;  Surgeon: DAlgernon Huxley MD;  [facility];  Service: General;  Laterality: Left;   AMPUTATION Right 11/15/2021   Procedure: BELOW KNEE AMPUTATION;  Surgeon: SKatha Cabal MD;  Location: [facility];  Service: Vascular;  Laterality: Right;   APPLICATION OF WOUND VAC Right 01/29/2020   Procedure: APPLICATION OF WOUND VAC;  Surgeon: RRonny Bacon MD;  Location: [facility];  Service: General;  Laterality: Right;   APPLICATION OF WOUND VAC Left 11/15/2021   Procedure: APPLICATION OF  WOUND VAC;  Surgeon: Katha Cabal, MD;  Location: [facility];  Service: Vascular;  Laterality: Left;   BREAST BIOPSY Right 11/06/2019   [place] core path pending venus clip   GRAFT APPLICATION  9/62/2297   Procedure: GRAFT APPLICATION;  Surgeon: [name], DPM;  Location: [facility];  Service: Podiatry;;   HERNIA REPAIR Left    I & D EXTREMITY Left 11/05/2021   Procedure: IRRIGATION AND DEBRIDEMENT LEFT BELOW KNEE STUMP;  Surgeon: Katha Cabal, MD;  Location: [facility];  Service: Vascular;  Laterality: Left;   INCISION AND DRAINAGE Bilateral 09/21/2021   Procedure: INCISION AND DRAINAGE;  Surgeon: [name], DPM;  Location: [facility];  Service: Podiatry;  Laterality: Bilateral;   INCISION AND DRAINAGE ABSCESS N/A 01/26/2018   Procedure: INCISION AND DEBRIDEMENT OF VULVAR NECROTIZING SOFT TISSUE INFECTION;  Surgeon: Greer Pickerel, MD;  Location: [place];  Service: General;  Laterality: N/A;   INCISION AND DRAINAGE ABSCESS N/A 07/21/2021   Procedure: INCISION AND DRAINAGE ABSCESS Scalp;  Surgeon: Robert Bellow, MD;  Location: [place] [facility];   Service: General;  Laterality: N/A;  scalp and right abdomen   INCISION AND DRAINAGE PERIRECTAL ABSCESS N/A 01/22/2018   Procedure: IRRIGATION AND DEBRIDEMENT LABIAL/VULVAR AREA;  Surgeon: Coralie Keens, MD;  Location: [place];  Service: General;  Laterality: N/A;   INCISION AND DRAINAGE PERIRECTAL ABSCESS N/A 01/29/2018   Procedure: IRRIGATION AND DEBRIDEMENT VULVA;  Surgeon: Excell Seltzer, MD;  Location: [place];  Service: General;  Laterality: N/A;   INGUINAL HERNIA REPAIR Left 04/08/2015   Procedure: OPEN LEFT INGUINAL HERNIA REPAIR WITH MESH;  Surgeon: Ralene Ok, MD;  Location: [place];  Service: General;  Laterality: Left;   INSERTION OF MESH Left 04/08/2015   Procedure: INSERTION OF MESH;  Surgeon: Ralene Ok, MD;  Location: [place];  Service: General;  Laterality: Left;   IRRIGATION AND DEBRIDEMENT ABSCESS N/A 07/21/2021   Procedure: IRRIGATION AND DEBRIDEMENT ABSCESS abdomen;  Surgeon: Robert Bellow, MD;  Location: [facility];  Service: General;  Laterality: N/A;   LAPAROSCOPY     Endometriosis   LEFT HEART CATHETERIZATION WITH CORONARY ANGIOGRAM N/A 12/05/2013   Procedure: LEFT HEART CATHETERIZATION WITH CORONARY ANGIOGRAM;  Surgeon: Jettie Booze, MD;  Location: [facility] C[facility];  Service: Cardiovascular;  Laterality: N/A;   LOWER EXTREMITY ANGIOGRAPHY Left 09/22/2021   Procedure: Lower Extremity Angiography;  Surgeon: Katha Cabal, MD;  Location: [place] [facility];  Service: Cardiovascular;  Laterality: Left;   LOWER EXTREMITY ANGIOGRAPHY Right 11/09/2021   Procedure: Lower Extremity Angiography;  Surgeon: Katha Cabal, MD;  Location: [place] [facility];  Service: Cardiovascular;  Laterality: Right;   MASTECTOMY Right    MINOR APPLICATION OF WOUND VAC Left 11/05/2021   Procedure: MINOR APPLICATION OF WOUND VAC;  Surgeon: Katha Cabal, MD;  Location: [facility];  Service: Vascular;  Laterality: Left;   right kidney drained     SIMPLE MASTECTOMY WITH  AXILLARY SENTINEL NODE BIOPSY Right 12/23/2019   Procedure: Right SIMPLE MASTECTOMY WITH AXILLARY SENTINEL NODE BIOPSY;  Surgeon: Ronny Bacon, MD;  Location: [facility];  Service: General;  Laterality: Right;   TEE WITHOUT CARDIOVERSION N/A 11/28/2013   Procedure: TRANSESOPHAGEAL ECHOCARDIOGRAM (TEE);  Surgeon: Thayer Headings, MD;  Location: [place];  Service: Cardiovascular;  Laterality: N/A;   TEE WITHOUT CARDIOVERSION N/A 10/13/2021   Procedure: TRANSESOPHAGEAL ECHOCARDIOGRAM (TEE);  Surgeon: Corey Skains, MD;  Location: [facility];  Service: Cardiovascular;  Laterality: N/A;  WOUND DEBRIDEMENT Right 01/29/2020   Procedure: DEBRIDEMENT WOUND, Excisional debridement skin, subcutaneous and muscle right chest wall;  Surgeon: Ronny Bacon, MD;  Location: [facility];  Service: General;  Laterality: Right;      Allergies  Allergen Reactions   Adhesive [Tape] Rash and Other (See Comments)    TAKES OFF THE SKIN (CERTAIN MEDICAL TAPES DO THIS!!)   Metoprolol Shortness Of Breath    Occurrence of shortness of breath after 3 days   Montelukast Shortness Of Breath   Morphine Sulfate Anaphylaxis, Shortness Of Breath and Nausea And Vomiting    Swollen Throat - Able to tolerate dilaudid   Penicillins Anaphylaxis, Hives and Shortness Of Breath    Throat swells Has patient had a PCN reaction causing immediate rash, facial/tongue/throat swelling, SOB or lightheadedness with hypotension: Yes Has patient had a PCN reaction causing severe rash involving mucus membranes or skin necrosis: No Has patient had a PCN reaction that required hospitalization: Yes Has patient had a PCN reaction occurring within the last 10 years: No If all of the above answers are "NO", then may proceed with Cephalosporin use.    Silicone Rash    Takes off the skin (certain medical tapes do this)   Diltiazem Swelling   Gabapentin Swelling   Midodrine     Lightheaded and falling down   Percocet  [Oxycodone-Acetaminophen] Rash    Current Outpatient Medications  Medication Sig Dispense Refill   acetaminophen (TYLENOL) 325 MG tablet Take 2 tablets (650 mg total) by mouth every 4 (four) hours as needed for headache or mild pain.     albuterol (VENTOLIN HFA) 108 (90 Base) MCG/ACT inhaler Inhale 2 puffs into the lungs every 6 (six) hours as needed for wheezing or shortness of breath.     ALPRAZolam (XANAX) 1 MG tablet Take 1 tablet (1 mg total) by mouth 4 (four) times daily as needed for anxiety. 5 tablet 0   anastrozole (ARIMIDEX) 1 MG tablet Take 1 tablet (1 mg total) by mouth daily. (Patient not taking: Reported on 11/04/2021) 30 tablet 3   aspirin EC 81 MG tablet Take 81 mg by mouth daily before breakfast.     budesonide-formoterol (SYMBICORT) 80-4.5 MCG/ACT inhaler INHALE 2 PUFFS BY MOUTH EVERY 12 HOURS TO PREVENT COUGH OR WHEEZING *RINSE MOUTH AFTER EACH USE* (Patient not taking: Reported on 12/09/2021) 10.2 g 2   busPIRone (BUSPAR) 30 MG tablet Take 30 mg by mouth 2 (two) times daily.     citalopram (CELEXA) 20 MG tablet Take 1 tablet (20 mg total) by mouth daily. 30 tablet 2   desvenlafaxine (PRISTIQ) 100 MG 24 hr tablet Take 100 mg by mouth daily.     EYLEA 2 MG/0.05ML SOSY  (Patient not taking: Reported on 12/09/2021)     insulin aspart (NOVOLOG) 100 UNIT/ML FlexPen Inject 1-9 Units into the skin 4 (four) times daily -  before meals and at bedtime. Glucose 121 - 150: 1 unit, Glucose 151 - 200: 2 units, Glucose 201 - 250: 3 units, Glucose 251 - 300: 5 units, Glucose 301 - 350: 7 units, Glucose 351 - 400: 9 units, Glucose > 400 call MD 15 mL 11   insulin glargine (LANTUS) 100 UNIT/ML Solostar Pen Inject 35 Units into the skin daily. 15 mL 11   Omadacycline Tosylate (NUZYRA) 150 MG TABS Take 3 tablets by mouth on days 1 and 2 and then take 2 tablets by mouth daily thereafter. Take on an empty stomach. 54 tablet 0   ondansetron (  ZOFRAN) 8 MG tablet One pill every 8 hours as needed for  nausea/vomitting. 40 tablet 1   oxyCODONE (OXY IR/ROXICODONE) 5 MG immediate release tablet Take 1 tablet (5 mg total) by mouth every 6 (six) hours as needed for severe pain. 30 tablet 0   OXYGEN Inhale 2-4 L into the lungs 2 (two) times daily as needed (shortness of breath).     pregabalin (LYRICA) 300 MG capsule TAKE 1 CAPSULE BY MOUTH TWICE DAILY FOR  NERVE  PAIN 180 capsule 0   rOPINIRole (REQUIP) 1 MG tablet TAKE 1 TABLET BY MOUTH EVERY DAY AT BEDTIME FOR  RESTLESS  LEG 90 tablet 0   rosuvastatin (CRESTOR) 10 MG tablet Take 1 tablet (10 mg total) by mouth at bedtime. 30 tablet 0   spironolactone (ALDACTONE) 50 MG tablet Take 50 mg by mouth daily.     topiramate (TOPAMAX) 50 MG tablet TAKE 1 TABLET BY MOUTH TWICE DAILY FOR HEADACHE PREVENTION 180 tablet 0   traZODone (DESYREL) 100 MG tablet Take 200 mg by mouth at bedtime.     No current facility-administered medications for this visit.        Physical Exam LMP  (LMP Unknown)  Gen:  WD/WN, NAD Skin: incision C/D/I     Assessment/Plan:  No problem-specific Assessment & Plan notes found for this encounter.      [name] 12/29/2021, 9:55 AM   This note was created with Dragon medical transcription system.  Any errors from dictation are unintentional.

## 2021-12-30 ENCOUNTER — Telehealth (INDEPENDENT_AMBULATORY_CARE_PROVIDER_SITE_OTHER): Payer: Self-pay | Admitting: Vascular Surgery

## 2021-12-30 ENCOUNTER — Other Ambulatory Visit (INDEPENDENT_AMBULATORY_CARE_PROVIDER_SITE_OTHER): Payer: Self-pay | Admitting: Nurse Practitioner

## 2021-12-30 ENCOUNTER — Telehealth: Payer: BC Managed Care – PPO | Admitting: Infectious Diseases

## 2021-12-30 ENCOUNTER — Ambulatory Visit (INDEPENDENT_AMBULATORY_CARE_PROVIDER_SITE_OTHER): Payer: Medicare Other | Admitting: Vascular Surgery

## 2021-12-30 DIAGNOSIS — Z89511 Acquired absence of right leg below knee: Secondary | ICD-10-CM

## 2021-12-30 DIAGNOSIS — E1122 Type 2 diabetes mellitus with diabetic chronic kidney disease: Secondary | ICD-10-CM | POA: Diagnosis not present

## 2021-12-30 DIAGNOSIS — I130 Hypertensive heart and chronic kidney disease with heart failure and stage 1 through stage 4 chronic kidney disease, or unspecified chronic kidney disease: Secondary | ICD-10-CM | POA: Diagnosis not present

## 2021-12-30 DIAGNOSIS — T8744 Infection of amputation stump, left lower extremity: Secondary | ICD-10-CM | POA: Diagnosis not present

## 2021-12-30 DIAGNOSIS — Z4781 Encounter for orthopedic aftercare following surgical amputation: Secondary | ICD-10-CM | POA: Diagnosis not present

## 2021-12-30 DIAGNOSIS — F319 Bipolar disorder, unspecified: Secondary | ICD-10-CM | POA: Diagnosis not present

## 2021-12-30 DIAGNOSIS — I5032 Chronic diastolic (congestive) heart failure: Secondary | ICD-10-CM | POA: Diagnosis not present

## 2021-12-30 DIAGNOSIS — E1151 Type 2 diabetes mellitus with diabetic peripheral angiopathy without gangrene: Secondary | ICD-10-CM | POA: Diagnosis not present

## 2021-12-30 DIAGNOSIS — F419 Anxiety disorder, unspecified: Secondary | ICD-10-CM | POA: Diagnosis not present

## 2021-12-30 DIAGNOSIS — E782 Mixed hyperlipidemia: Secondary | ICD-10-CM | POA: Diagnosis not present

## 2021-12-30 DIAGNOSIS — I872 Venous insufficiency (chronic) (peripheral): Secondary | ICD-10-CM | POA: Diagnosis not present

## 2021-12-30 DIAGNOSIS — L089 Local infection of the skin and subcutaneous tissue, unspecified: Secondary | ICD-10-CM

## 2021-12-30 DIAGNOSIS — E1169 Type 2 diabetes mellitus with other specified complication: Secondary | ICD-10-CM | POA: Diagnosis not present

## 2021-12-30 DIAGNOSIS — N1831 Chronic kidney disease, stage 3a: Secondary | ICD-10-CM | POA: Diagnosis not present

## 2021-12-30 DIAGNOSIS — J449 Chronic obstructive pulmonary disease, unspecified: Secondary | ICD-10-CM | POA: Diagnosis not present

## 2021-12-30 MED ORDER — OXYCODONE HCL 5 MG PO TABS: 5.0000 mg | ORAL_TABLET | Freq: Four times a day (QID) | ORAL | 0 refills | Status: DC | PRN

## 2021-12-30 NOTE — Telephone Encounter (Signed)
Called verbal order given as requested. Called patient have added as virtual with Anda Kraft on 6/29 at 340

## 2021-12-30 NOTE — Telephone Encounter (Signed)
Reach out to patient but was not able to leave a message

## 2021-12-30 NOTE — Telephone Encounter (Signed)
Refill sent.

## 2021-12-30 NOTE — Telephone Encounter (Signed)
Can we get a virtual follow up visit scheduled with this patient? End of a session?

## 2021-12-30 NOTE — Telephone Encounter (Signed)
Called patient about another mater and reviewed this information. She will call around and let us know if she needs [place] to send order to medical supply and their contact information.

## 2021-12-30 NOTE — Telephone Encounter (Signed)
Patients husband called in and rsch appt due to rainy weather and also requested a refill on patients pain medication   Please call and advise    oxyCODONE (OXY IR/ROXICODONE) 5 MG immediate release tablet  [place],  - [place]

## 2021-12-30 NOTE — Telephone Encounter (Signed)
Appointment made with patient. No further action needed at this time.

## 2021-12-30 NOTE — Telephone Encounter (Signed)
Approved.  Can we get patient scheduled for a virtual follow up visit with me? Needs to be at the end of a session.

## 2021-12-31 DIAGNOSIS — I130 Hypertensive heart and chronic kidney disease with heart failure and stage 1 through stage 4 chronic kidney disease, or unspecified chronic kidney disease: Secondary | ICD-10-CM | POA: Diagnosis not present

## 2021-12-31 DIAGNOSIS — J449 Chronic obstructive pulmonary disease, unspecified: Secondary | ICD-10-CM | POA: Diagnosis not present

## 2021-12-31 DIAGNOSIS — E1122 Type 2 diabetes mellitus with diabetic chronic kidney disease: Secondary | ICD-10-CM | POA: Diagnosis not present

## 2021-12-31 DIAGNOSIS — I872 Venous insufficiency (chronic) (peripheral): Secondary | ICD-10-CM | POA: Diagnosis not present

## 2021-12-31 DIAGNOSIS — F419 Anxiety disorder, unspecified: Secondary | ICD-10-CM | POA: Diagnosis not present

## 2021-12-31 DIAGNOSIS — I5032 Chronic diastolic (congestive) heart failure: Secondary | ICD-10-CM | POA: Diagnosis not present

## 2021-12-31 DIAGNOSIS — E1169 Type 2 diabetes mellitus with other specified complication: Secondary | ICD-10-CM | POA: Diagnosis not present

## 2021-12-31 DIAGNOSIS — F319 Bipolar disorder, unspecified: Secondary | ICD-10-CM | POA: Diagnosis not present

## 2021-12-31 DIAGNOSIS — N1831 Chronic kidney disease, stage 3a: Secondary | ICD-10-CM | POA: Diagnosis not present

## 2021-12-31 DIAGNOSIS — E1151 Type 2 diabetes mellitus with diabetic peripheral angiopathy without gangrene: Secondary | ICD-10-CM | POA: Diagnosis not present

## 2021-12-31 DIAGNOSIS — E782 Mixed hyperlipidemia: Secondary | ICD-10-CM | POA: Diagnosis not present

## 2021-12-31 DIAGNOSIS — T8744 Infection of amputation stump, left lower extremity: Secondary | ICD-10-CM | POA: Diagnosis not present

## 2021-12-31 DIAGNOSIS — Z4781 Encounter for orthopedic aftercare following surgical amputation: Secondary | ICD-10-CM | POA: Diagnosis not present

## 2021-12-31 NOTE — Telephone Encounter (Signed)
Left a message on patient voicemail refill has been sent

## 2022-01-01 DIAGNOSIS — J449 Chronic obstructive pulmonary disease, unspecified: Secondary | ICD-10-CM | POA: Diagnosis not present

## 2022-01-01 DIAGNOSIS — F419 Anxiety disorder, unspecified: Secondary | ICD-10-CM | POA: Diagnosis not present

## 2022-01-01 DIAGNOSIS — I872 Venous insufficiency (chronic) (peripheral): Secondary | ICD-10-CM | POA: Diagnosis not present

## 2022-01-01 DIAGNOSIS — T8744 Infection of amputation stump, left lower extremity: Secondary | ICD-10-CM | POA: Diagnosis not present

## 2022-01-01 DIAGNOSIS — E1151 Type 2 diabetes mellitus with diabetic peripheral angiopathy without gangrene: Secondary | ICD-10-CM | POA: Diagnosis not present

## 2022-01-01 DIAGNOSIS — E1169 Type 2 diabetes mellitus with other specified complication: Secondary | ICD-10-CM | POA: Diagnosis not present

## 2022-01-01 DIAGNOSIS — I130 Hypertensive heart and chronic kidney disease with heart failure and stage 1 through stage 4 chronic kidney disease, or unspecified chronic kidney disease: Secondary | ICD-10-CM | POA: Diagnosis not present

## 2022-01-01 DIAGNOSIS — E782 Mixed hyperlipidemia: Secondary | ICD-10-CM | POA: Diagnosis not present

## 2022-01-01 DIAGNOSIS — I5032 Chronic diastolic (congestive) heart failure: Secondary | ICD-10-CM | POA: Diagnosis not present

## 2022-01-01 DIAGNOSIS — Z4781 Encounter for orthopedic aftercare following surgical amputation: Secondary | ICD-10-CM | POA: Diagnosis not present

## 2022-01-01 DIAGNOSIS — E1122 Type 2 diabetes mellitus with diabetic chronic kidney disease: Secondary | ICD-10-CM | POA: Diagnosis not present

## 2022-01-01 DIAGNOSIS — F319 Bipolar disorder, unspecified: Secondary | ICD-10-CM | POA: Diagnosis not present

## 2022-01-01 DIAGNOSIS — N1831 Chronic kidney disease, stage 3a: Secondary | ICD-10-CM | POA: Diagnosis not present

## 2022-01-03 ENCOUNTER — Encounter (INDEPENDENT_AMBULATORY_CARE_PROVIDER_SITE_OTHER): Payer: Self-pay | Admitting: Nurse Practitioner

## 2022-01-03 ENCOUNTER — Ambulatory Visit (INDEPENDENT_AMBULATORY_CARE_PROVIDER_SITE_OTHER): Payer: Medicare Other | Admitting: Nurse Practitioner

## 2022-01-03 VITALS — BP 148/84 | HR 74 | Resp 17

## 2022-01-03 DIAGNOSIS — Z89511 Acquired absence of right leg below knee: Secondary | ICD-10-CM

## 2022-01-03 DIAGNOSIS — Z89512 Acquired absence of left leg below knee: Secondary | ICD-10-CM

## 2022-01-04 ENCOUNTER — Telehealth (INDEPENDENT_AMBULATORY_CARE_PROVIDER_SITE_OTHER): Payer: Self-pay

## 2022-01-04 ENCOUNTER — Encounter (INDEPENDENT_AMBULATORY_CARE_PROVIDER_SITE_OTHER): Payer: Self-pay | Admitting: Nurse Practitioner

## 2022-01-04 DIAGNOSIS — E1151 Type 2 diabetes mellitus with diabetic peripheral angiopathy without gangrene: Secondary | ICD-10-CM | POA: Diagnosis not present

## 2022-01-04 DIAGNOSIS — E1122 Type 2 diabetes mellitus with diabetic chronic kidney disease: Secondary | ICD-10-CM | POA: Diagnosis not present

## 2022-01-04 DIAGNOSIS — N1831 Chronic kidney disease, stage 3a: Secondary | ICD-10-CM | POA: Diagnosis not present

## 2022-01-04 DIAGNOSIS — E1169 Type 2 diabetes mellitus with other specified complication: Secondary | ICD-10-CM | POA: Diagnosis not present

## 2022-01-04 DIAGNOSIS — F419 Anxiety disorder, unspecified: Secondary | ICD-10-CM | POA: Diagnosis not present

## 2022-01-04 DIAGNOSIS — T8744 Infection of amputation stump, left lower extremity: Secondary | ICD-10-CM | POA: Diagnosis not present

## 2022-01-04 DIAGNOSIS — J449 Chronic obstructive pulmonary disease, unspecified: Secondary | ICD-10-CM | POA: Diagnosis not present

## 2022-01-04 DIAGNOSIS — E782 Mixed hyperlipidemia: Secondary | ICD-10-CM | POA: Diagnosis not present

## 2022-01-04 DIAGNOSIS — I130 Hypertensive heart and chronic kidney disease with heart failure and stage 1 through stage 4 chronic kidney disease, or unspecified chronic kidney disease: Secondary | ICD-10-CM | POA: Diagnosis not present

## 2022-01-04 DIAGNOSIS — F319 Bipolar disorder, unspecified: Secondary | ICD-10-CM | POA: Diagnosis not present

## 2022-01-04 DIAGNOSIS — I872 Venous insufficiency (chronic) (peripheral): Secondary | ICD-10-CM | POA: Diagnosis not present

## 2022-01-04 DIAGNOSIS — I5032 Chronic diastolic (congestive) heart failure: Secondary | ICD-10-CM | POA: Diagnosis not present

## 2022-01-04 DIAGNOSIS — Z4781 Encounter for orthopedic aftercare following surgical amputation: Secondary | ICD-10-CM | POA: Diagnosis not present

## 2022-01-05 DIAGNOSIS — E1129 Type 2 diabetes mellitus with other diabetic kidney complication: Secondary | ICD-10-CM | POA: Diagnosis not present

## 2022-01-05 DIAGNOSIS — B9562 Methicillin resistant Staphylococcus aureus infection as the cause of diseases classified elsewhere: Secondary | ICD-10-CM | POA: Diagnosis not present

## 2022-01-05 DIAGNOSIS — R2689 Other abnormalities of gait and mobility: Secondary | ICD-10-CM | POA: Diagnosis not present

## 2022-01-05 DIAGNOSIS — L97523 Non-pressure chronic ulcer of other part of left foot with necrosis of muscle: Secondary | ICD-10-CM | POA: Diagnosis not present

## 2022-01-06 ENCOUNTER — Telehealth: Payer: Self-pay | Admitting: *Deleted

## 2022-01-06 ENCOUNTER — Telehealth: Payer: BC Managed Care – PPO | Admitting: Primary Care

## 2022-01-06 DIAGNOSIS — I130 Hypertensive heart and chronic kidney disease with heart failure and stage 1 through stage 4 chronic kidney disease, or unspecified chronic kidney disease: Secondary | ICD-10-CM | POA: Diagnosis not present

## 2022-01-06 DIAGNOSIS — J449 Chronic obstructive pulmonary disease, unspecified: Secondary | ICD-10-CM | POA: Diagnosis not present

## 2022-01-06 DIAGNOSIS — F419 Anxiety disorder, unspecified: Secondary | ICD-10-CM | POA: Diagnosis not present

## 2022-01-06 DIAGNOSIS — I5032 Chronic diastolic (congestive) heart failure: Secondary | ICD-10-CM | POA: Diagnosis not present

## 2022-01-06 DIAGNOSIS — I872 Venous insufficiency (chronic) (peripheral): Secondary | ICD-10-CM | POA: Diagnosis not present

## 2022-01-06 DIAGNOSIS — T8744 Infection of amputation stump, left lower extremity: Secondary | ICD-10-CM | POA: Diagnosis not present

## 2022-01-06 DIAGNOSIS — E782 Mixed hyperlipidemia: Secondary | ICD-10-CM | POA: Diagnosis not present

## 2022-01-06 DIAGNOSIS — N1831 Chronic kidney disease, stage 3a: Secondary | ICD-10-CM | POA: Diagnosis not present

## 2022-01-06 DIAGNOSIS — E1151 Type 2 diabetes mellitus with diabetic peripheral angiopathy without gangrene: Secondary | ICD-10-CM | POA: Diagnosis not present

## 2022-01-06 DIAGNOSIS — E1169 Type 2 diabetes mellitus with other specified complication: Secondary | ICD-10-CM | POA: Diagnosis not present

## 2022-01-06 DIAGNOSIS — F319 Bipolar disorder, unspecified: Secondary | ICD-10-CM | POA: Diagnosis not present

## 2022-01-06 DIAGNOSIS — E1122 Type 2 diabetes mellitus with diabetic chronic kidney disease: Secondary | ICD-10-CM | POA: Diagnosis not present

## 2022-01-06 DIAGNOSIS — Z4781 Encounter for orthopedic aftercare following surgical amputation: Secondary | ICD-10-CM | POA: Diagnosis not present

## 2022-01-06 NOTE — Telephone Encounter (Signed)
Received on (01/03/2022) via fax home health orders from [place].  Requesting signature and return.  Given to provider to sign.    Home health orders was not able to be signed.  This is not our patient.  Faxed orders back to [facility] informing them that this in not our patient.  Confirmation received and copy scanned into the chart.//AB/CMA

## 2022-01-07 DIAGNOSIS — J449 Chronic obstructive pulmonary disease, unspecified: Secondary | ICD-10-CM | POA: Diagnosis not present

## 2022-01-08 DIAGNOSIS — I5032 Chronic diastolic (congestive) heart failure: Secondary | ICD-10-CM | POA: Diagnosis not present

## 2022-01-08 DIAGNOSIS — N1831 Chronic kidney disease, stage 3a: Secondary | ICD-10-CM | POA: Diagnosis not present

## 2022-01-08 DIAGNOSIS — Z4781 Encounter for orthopedic aftercare following surgical amputation: Secondary | ICD-10-CM | POA: Diagnosis not present

## 2022-01-08 DIAGNOSIS — I130 Hypertensive heart and chronic kidney disease with heart failure and stage 1 through stage 4 chronic kidney disease, or unspecified chronic kidney disease: Secondary | ICD-10-CM | POA: Diagnosis not present

## 2022-01-08 DIAGNOSIS — E782 Mixed hyperlipidemia: Secondary | ICD-10-CM | POA: Diagnosis not present

## 2022-01-08 DIAGNOSIS — T8744 Infection of amputation stump, left lower extremity: Secondary | ICD-10-CM | POA: Diagnosis not present

## 2022-01-08 DIAGNOSIS — E1169 Type 2 diabetes mellitus with other specified complication: Secondary | ICD-10-CM | POA: Diagnosis not present

## 2022-01-08 DIAGNOSIS — I872 Venous insufficiency (chronic) (peripheral): Secondary | ICD-10-CM | POA: Diagnosis not present

## 2022-01-08 DIAGNOSIS — F419 Anxiety disorder, unspecified: Secondary | ICD-10-CM | POA: Diagnosis not present

## 2022-01-08 DIAGNOSIS — J449 Chronic obstructive pulmonary disease, unspecified: Secondary | ICD-10-CM | POA: Diagnosis not present

## 2022-01-08 DIAGNOSIS — E1151 Type 2 diabetes mellitus with diabetic peripheral angiopathy without gangrene: Secondary | ICD-10-CM | POA: Diagnosis not present

## 2022-01-08 DIAGNOSIS — E1122 Type 2 diabetes mellitus with diabetic chronic kidney disease: Secondary | ICD-10-CM | POA: Diagnosis not present

## 2022-01-08 DIAGNOSIS — F319 Bipolar disorder, unspecified: Secondary | ICD-10-CM | POA: Diagnosis not present

## 2022-01-10 ENCOUNTER — Telehealth: Payer: BC Managed Care – PPO | Admitting: Primary Care

## 2022-01-11 DIAGNOSIS — I872 Venous insufficiency (chronic) (peripheral): Secondary | ICD-10-CM | POA: Diagnosis not present

## 2022-01-11 DIAGNOSIS — E782 Mixed hyperlipidemia: Secondary | ICD-10-CM | POA: Diagnosis not present

## 2022-01-11 DIAGNOSIS — E1151 Type 2 diabetes mellitus with diabetic peripheral angiopathy without gangrene: Secondary | ICD-10-CM | POA: Diagnosis not present

## 2022-01-11 DIAGNOSIS — T8744 Infection of amputation stump, left lower extremity: Secondary | ICD-10-CM | POA: Diagnosis not present

## 2022-01-11 DIAGNOSIS — F319 Bipolar disorder, unspecified: Secondary | ICD-10-CM | POA: Diagnosis not present

## 2022-01-11 DIAGNOSIS — I5032 Chronic diastolic (congestive) heart failure: Secondary | ICD-10-CM | POA: Diagnosis not present

## 2022-01-11 DIAGNOSIS — N1831 Chronic kidney disease, stage 3a: Secondary | ICD-10-CM | POA: Diagnosis not present

## 2022-01-11 DIAGNOSIS — J449 Chronic obstructive pulmonary disease, unspecified: Secondary | ICD-10-CM | POA: Diagnosis not present

## 2022-01-11 DIAGNOSIS — Z4781 Encounter for orthopedic aftercare following surgical amputation: Secondary | ICD-10-CM | POA: Diagnosis not present

## 2022-01-11 DIAGNOSIS — E1122 Type 2 diabetes mellitus with diabetic chronic kidney disease: Secondary | ICD-10-CM | POA: Diagnosis not present

## 2022-01-11 DIAGNOSIS — I130 Hypertensive heart and chronic kidney disease with heart failure and stage 1 through stage 4 chronic kidney disease, or unspecified chronic kidney disease: Secondary | ICD-10-CM | POA: Diagnosis not present

## 2022-01-11 DIAGNOSIS — F419 Anxiety disorder, unspecified: Secondary | ICD-10-CM | POA: Diagnosis not present

## 2022-01-11 DIAGNOSIS — E1169 Type 2 diabetes mellitus with other specified complication: Secondary | ICD-10-CM | POA: Diagnosis not present

## 2022-01-12 IMAGING — CR DG THORACIC SPINE 2V
1 series · 3 of 3 positions shown · non-contrast
Comparison: None.

CLINICAL DATA: Fell.  Back pain.

EXAM:
THORACIC SPINE 2 VIEWS

[Series 1: dg thoracic spine 2 view · 0.14mm/px · 3 of 3 slices shown]
[im 1/3]
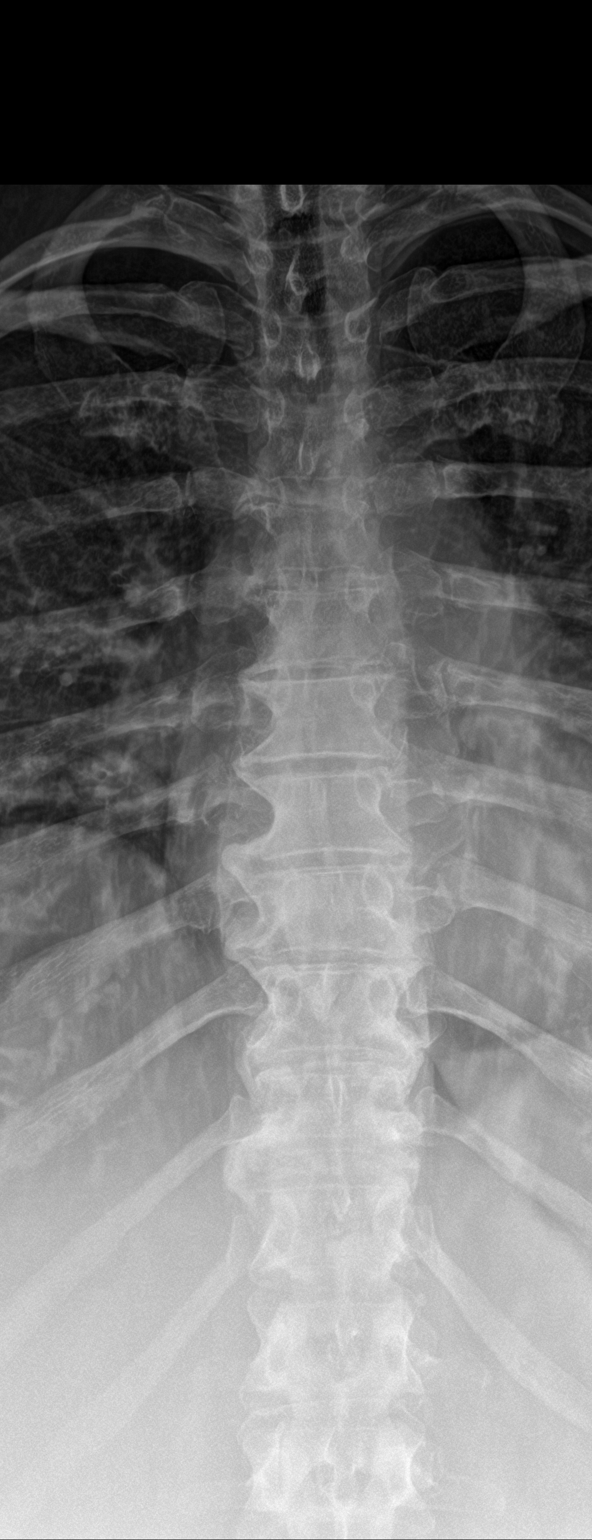
[im 2/3]
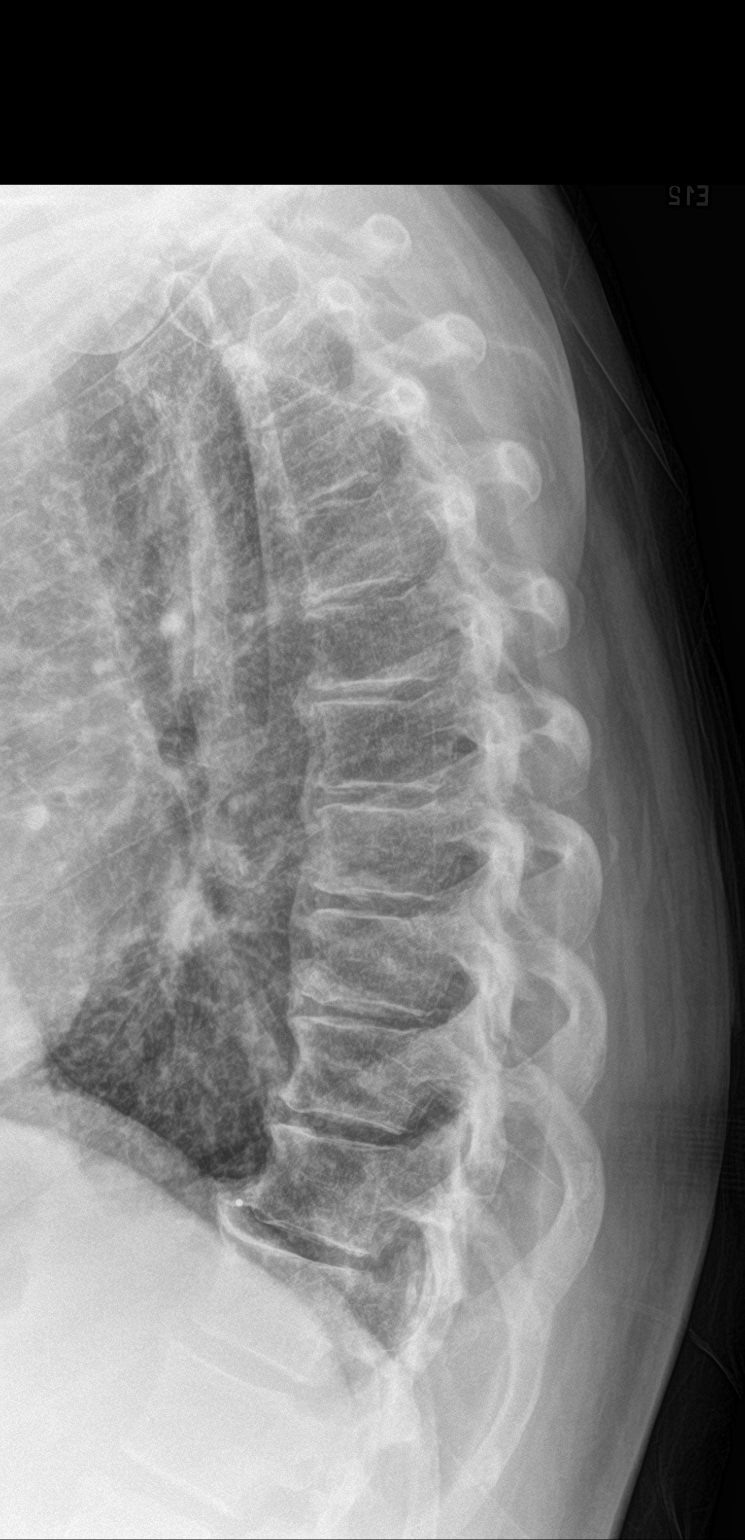
[im 3/3]
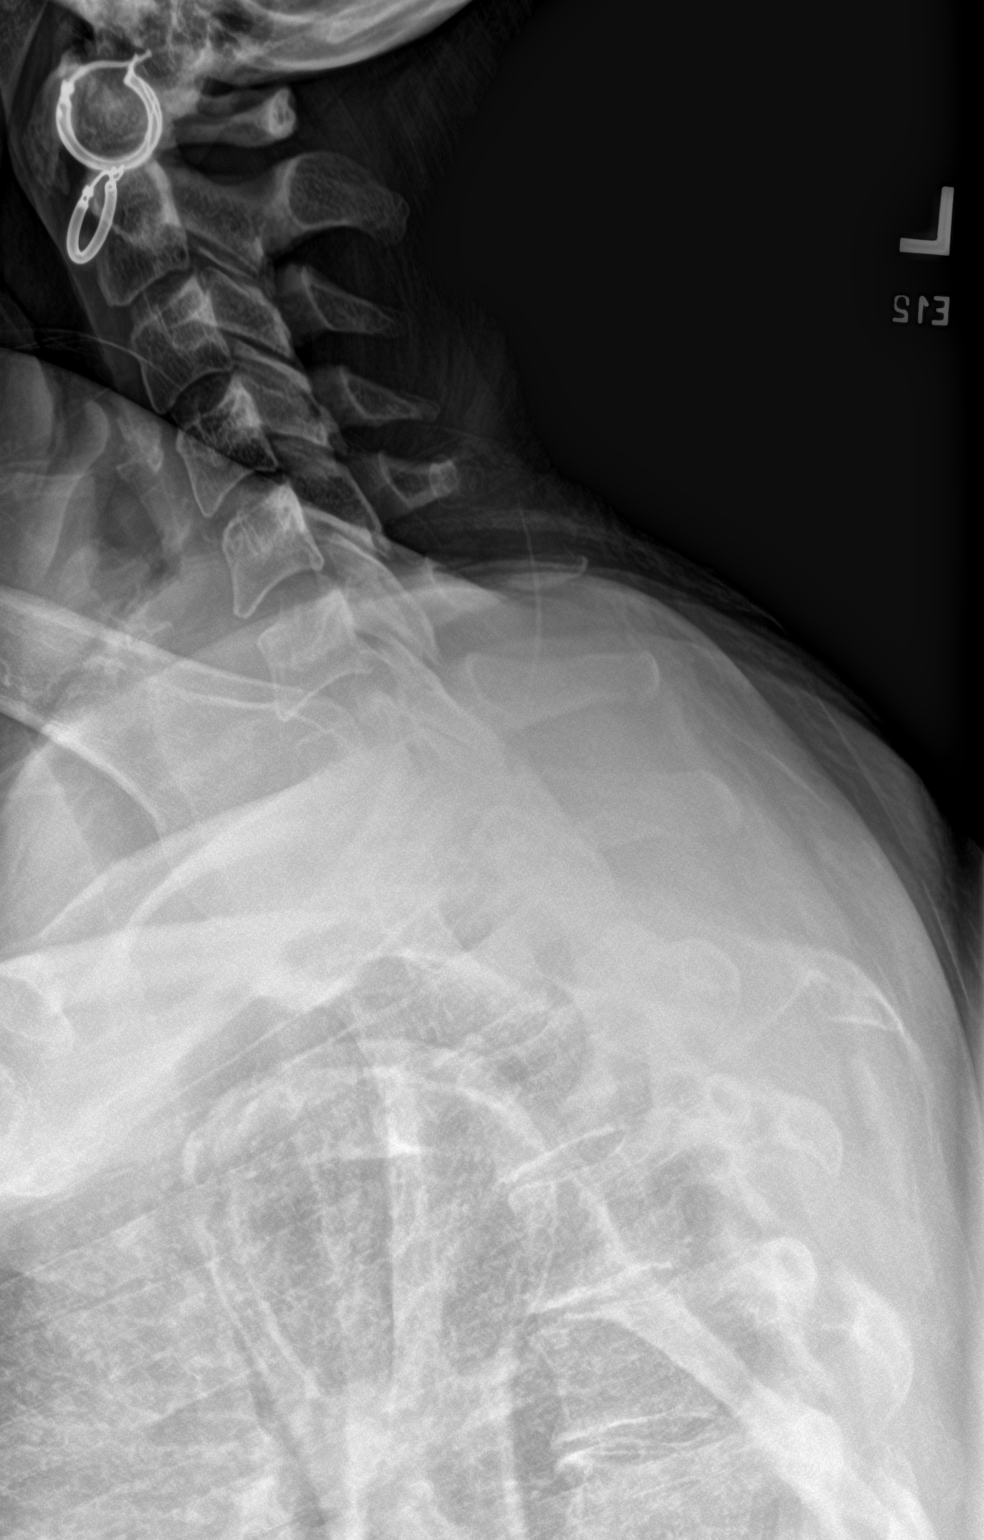

[3 of 3 positions shown; findings below may reference images not displayed]

FINDINGS: Normal alignment of the thoracic vertebral bodies. Moderate
degenerative changes. No acute bony findings or abnormal paraspinal
soft tissue swelling. The visualized posterior ribs are intact.
IMPRESSION: Degenerative changes but no acute bony findings.

## 2022-01-12 IMAGING — CR DG RIBS W/ CHEST 3+V*L*
1 series · 3 of 3 positions shown · non-contrast
Comparison: 03/03/2021

CLINICAL DATA: Fell.  Left rib pain.

EXAM:
LEFT RIBS AND CHEST - 3+ VIEW

[Series 1: dg ribs unilateral w/chest left · 0.14mm/px · 3 of 3 slices shown]
[im 1/3]
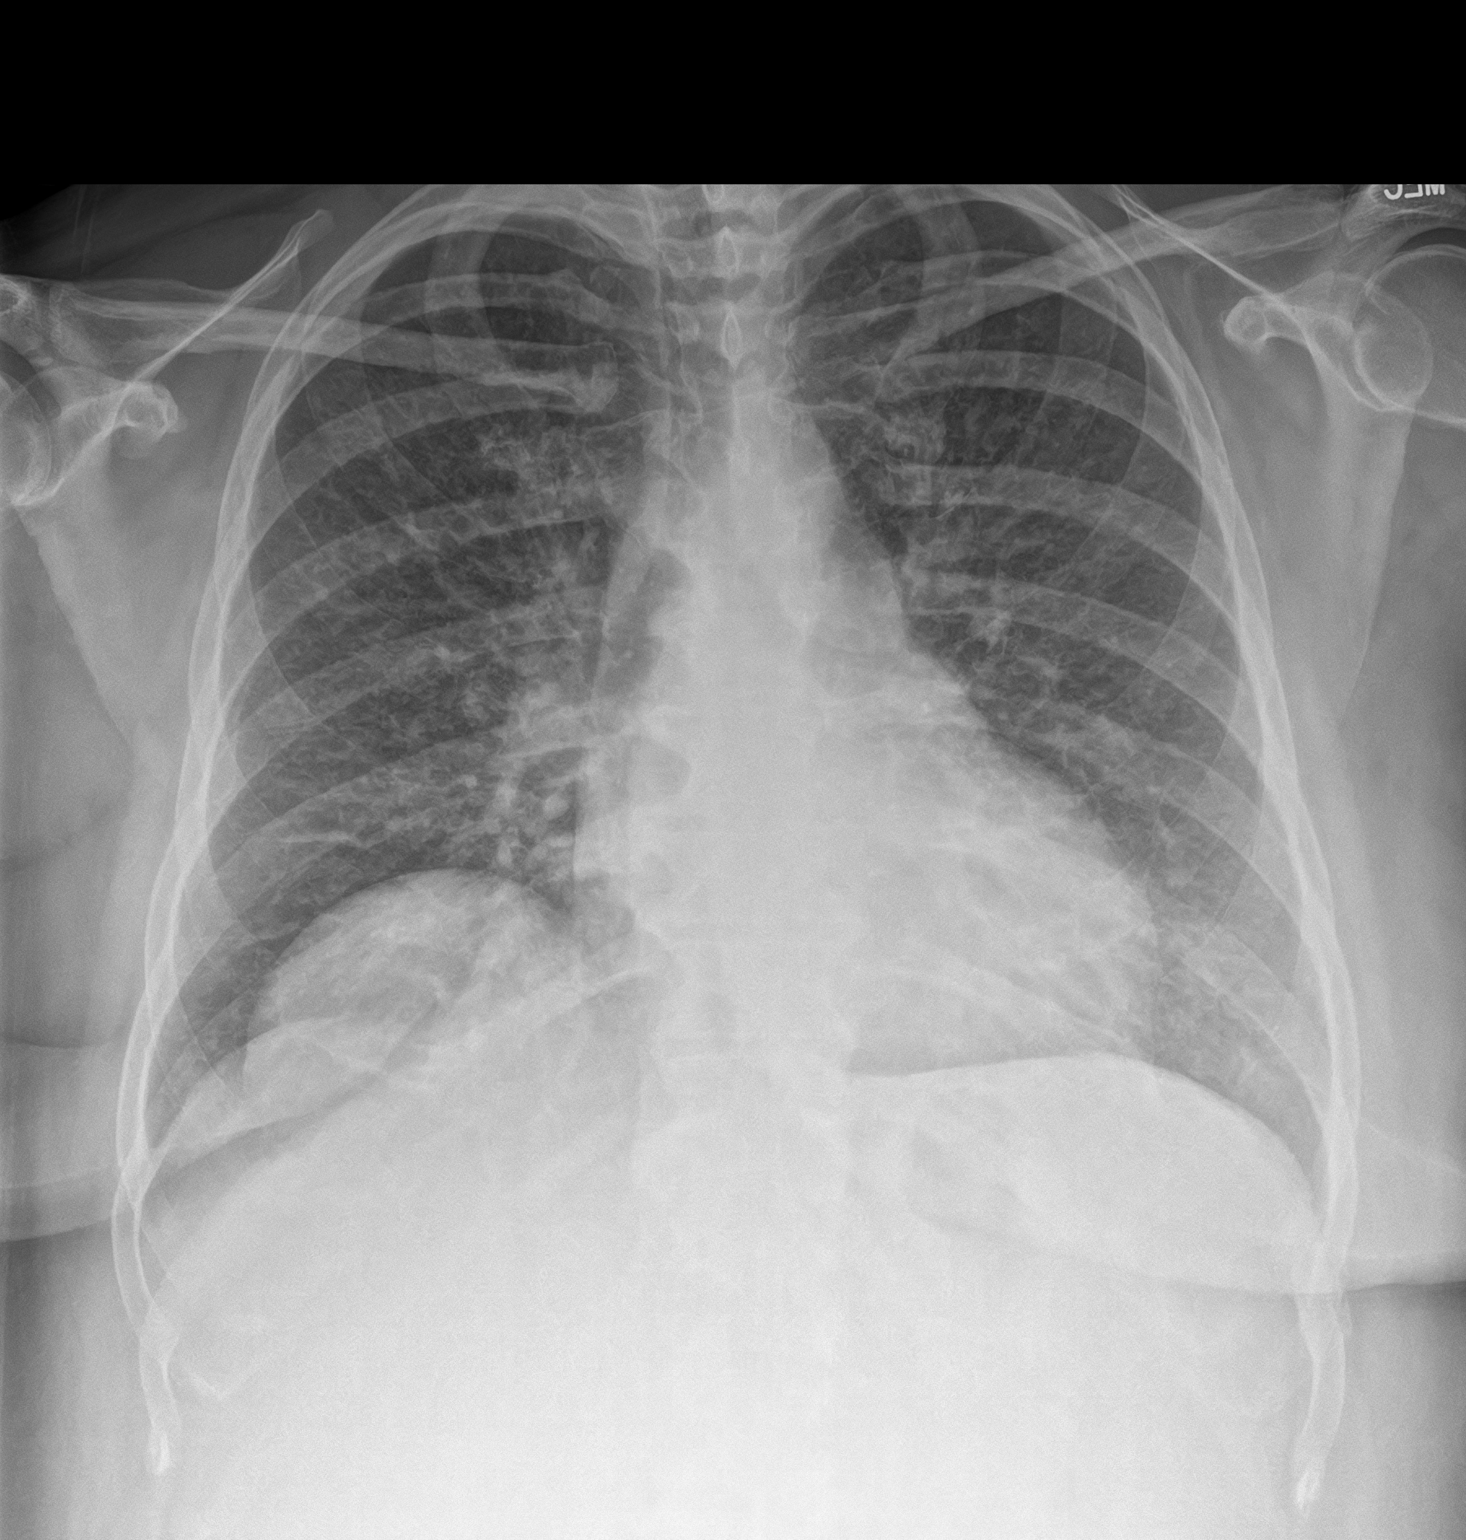
[im 2/3]
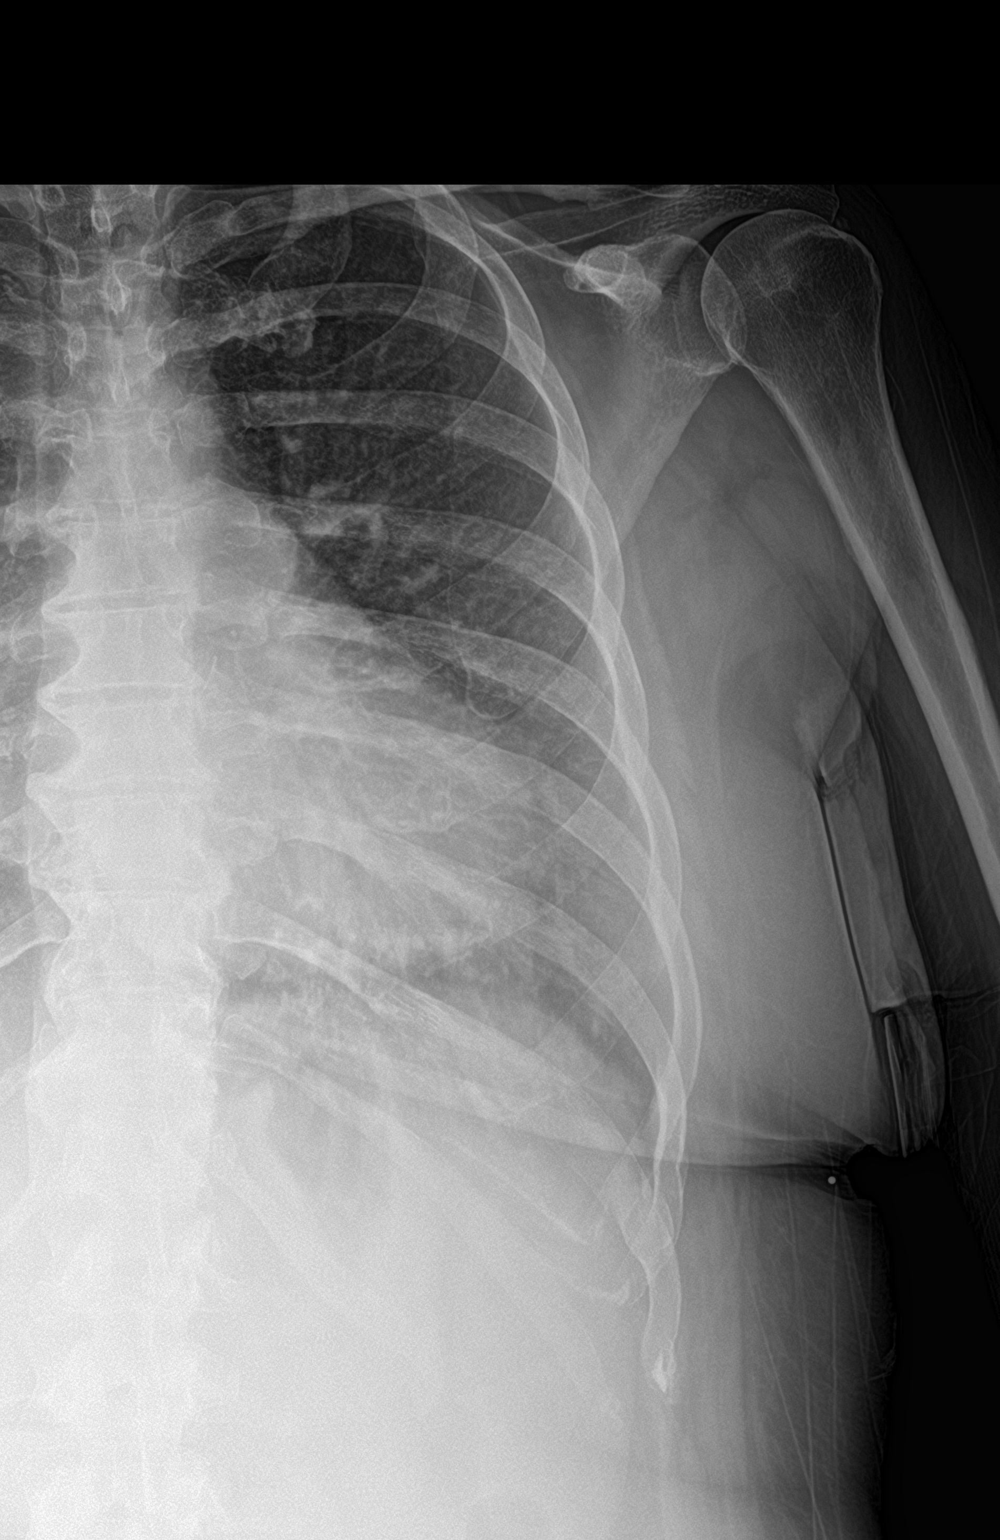
[im 3/3]
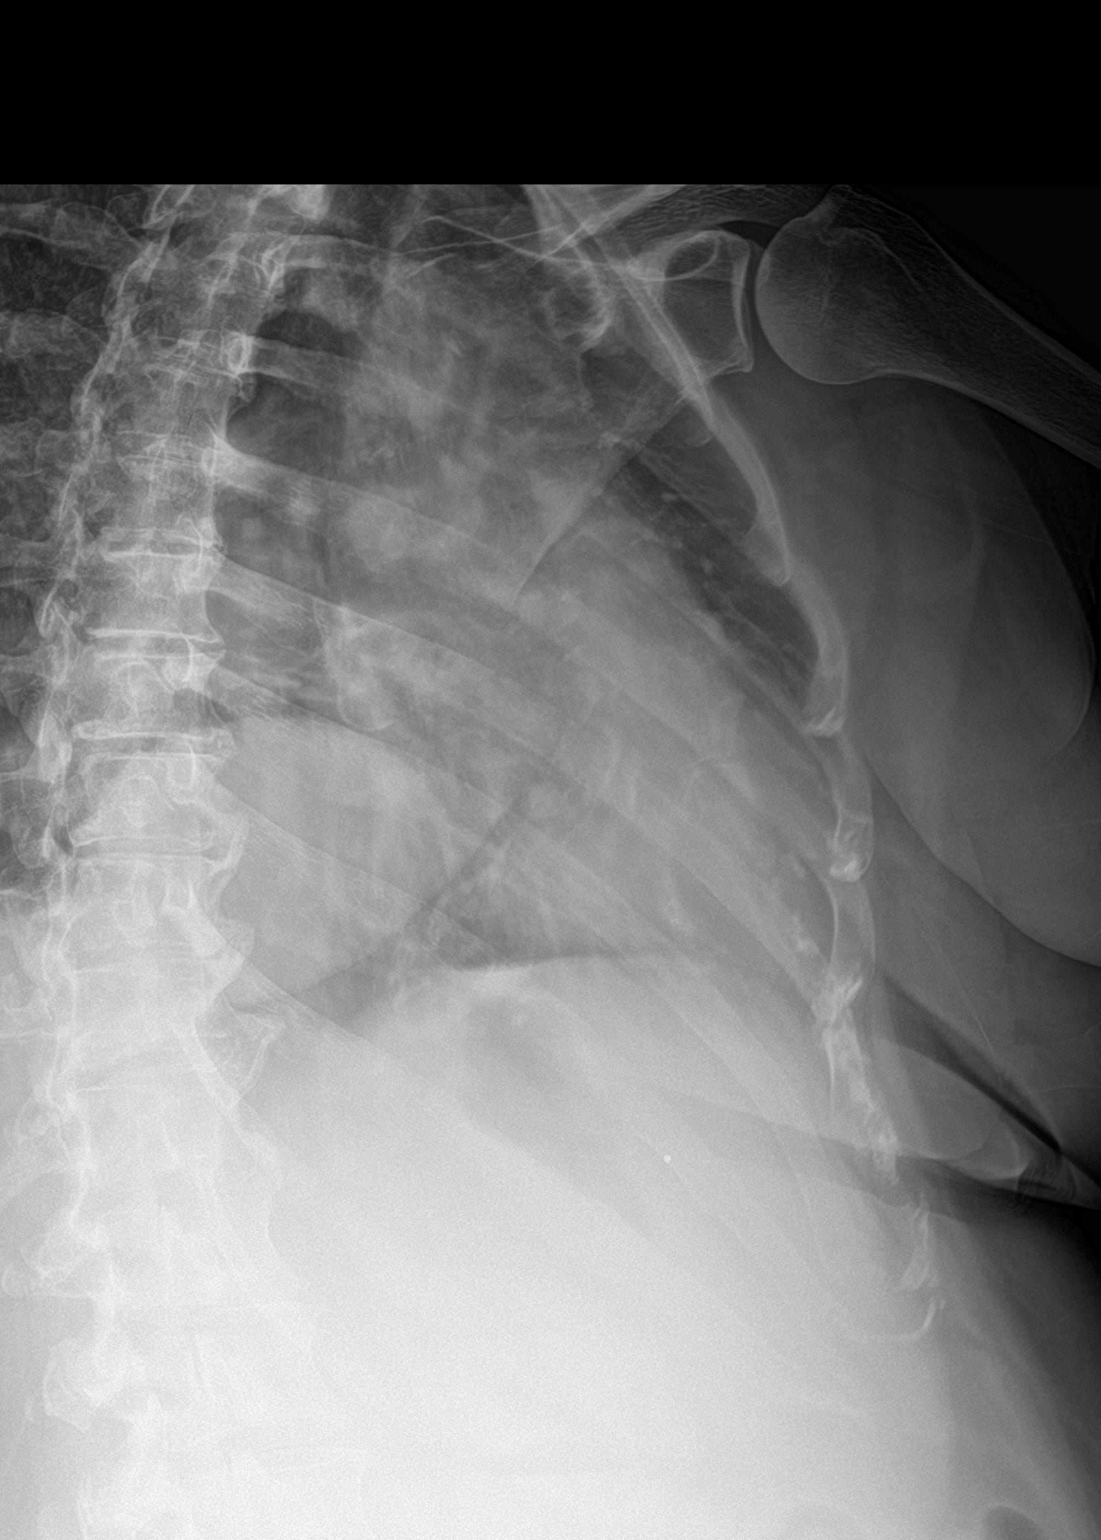

[3 of 3 positions shown; findings below may reference images not displayed]

FINDINGS: The cardiac silhouette, mediastinal and hilar contours are within
normal limits. Chronic pulmonary changes no acute superimposed
pulmonary process. No pleural effusion or pneumothorax.

Dedicated views of the left ribs demonstrate a nondisplaced fracture
of the ninth anterolateral rib. No other definite rib fractures are
identified.
IMPRESSION: 1. Nondisplaced left ninth anterolateral rib fracture.
2. Chronic pulmonary changes.  No acute cardiopulmonary findings.

## 2022-01-12 IMAGING — CR DG LUMBAR SPINE 2-3V
1 series · 3 of 3 positions shown · non-contrast
Comparison: 09/05/2018

CLINICAL DATA: Fell.  Back pain.

EXAM:
LUMBAR SPINE - 2-3 VIEW

[Series 1: dg lumbar spine 2-3 views · 0.14mm/px · 3 of 3 slices shown]
[im 1/3]
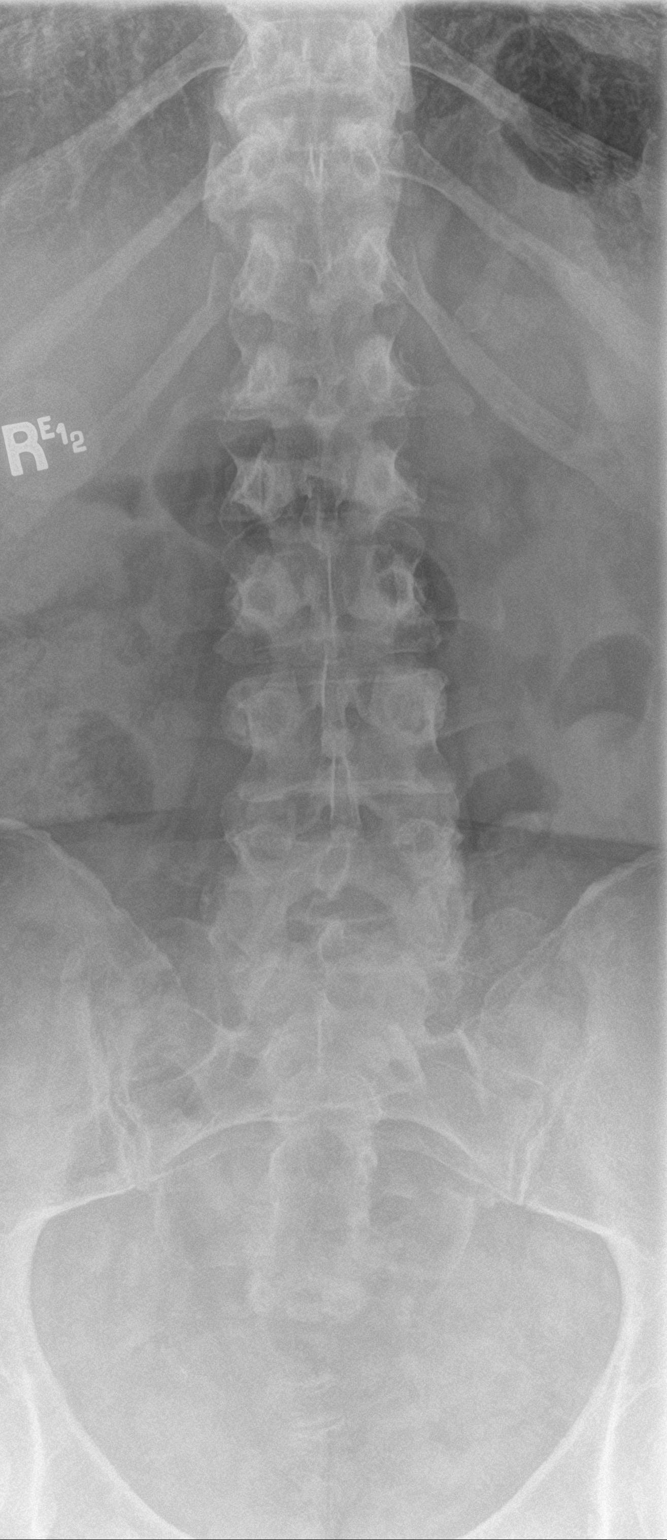
[im 2/3]
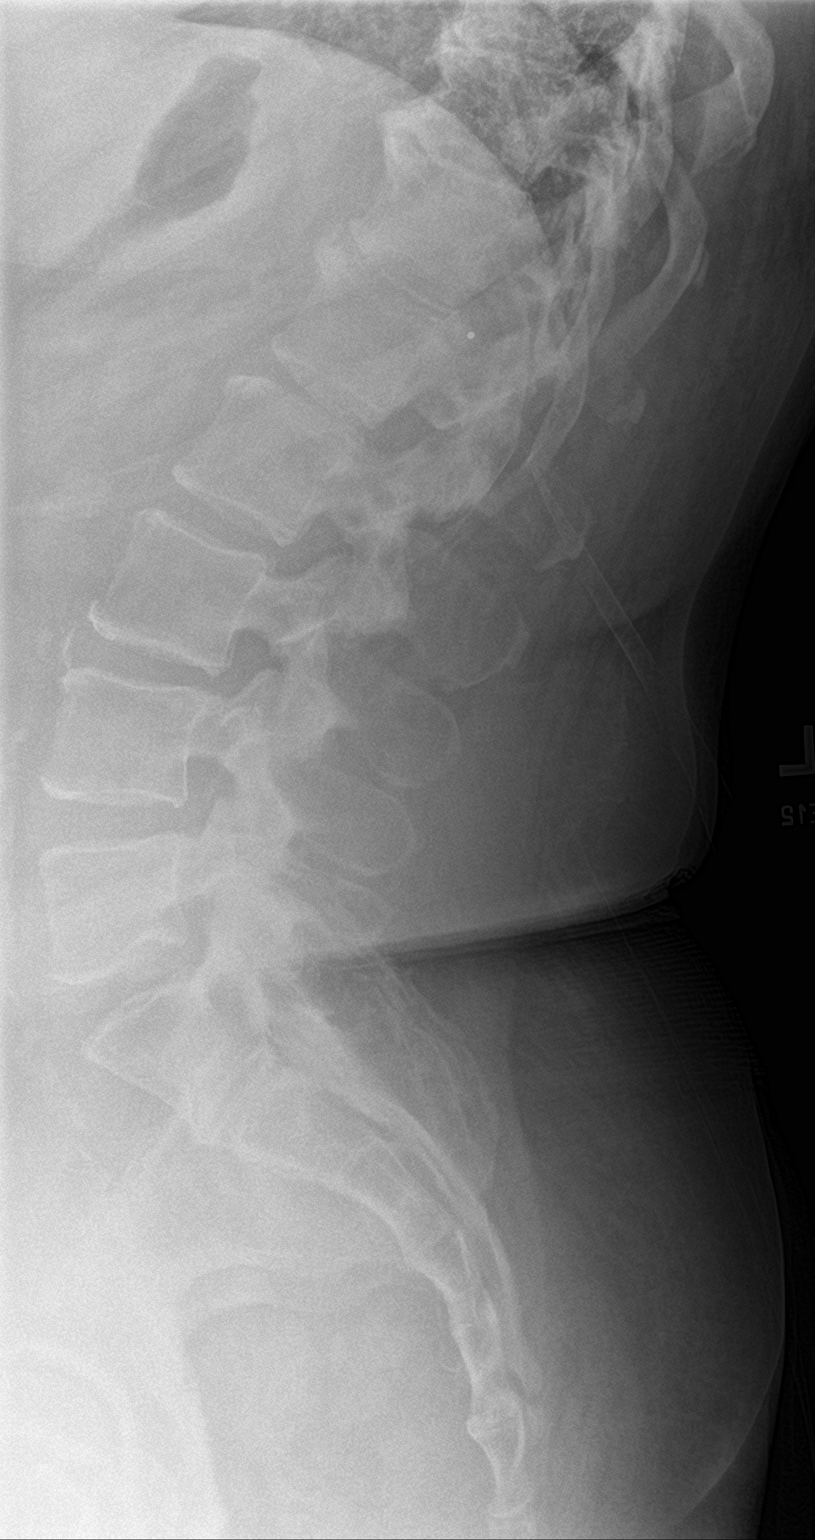
[im 3/3]
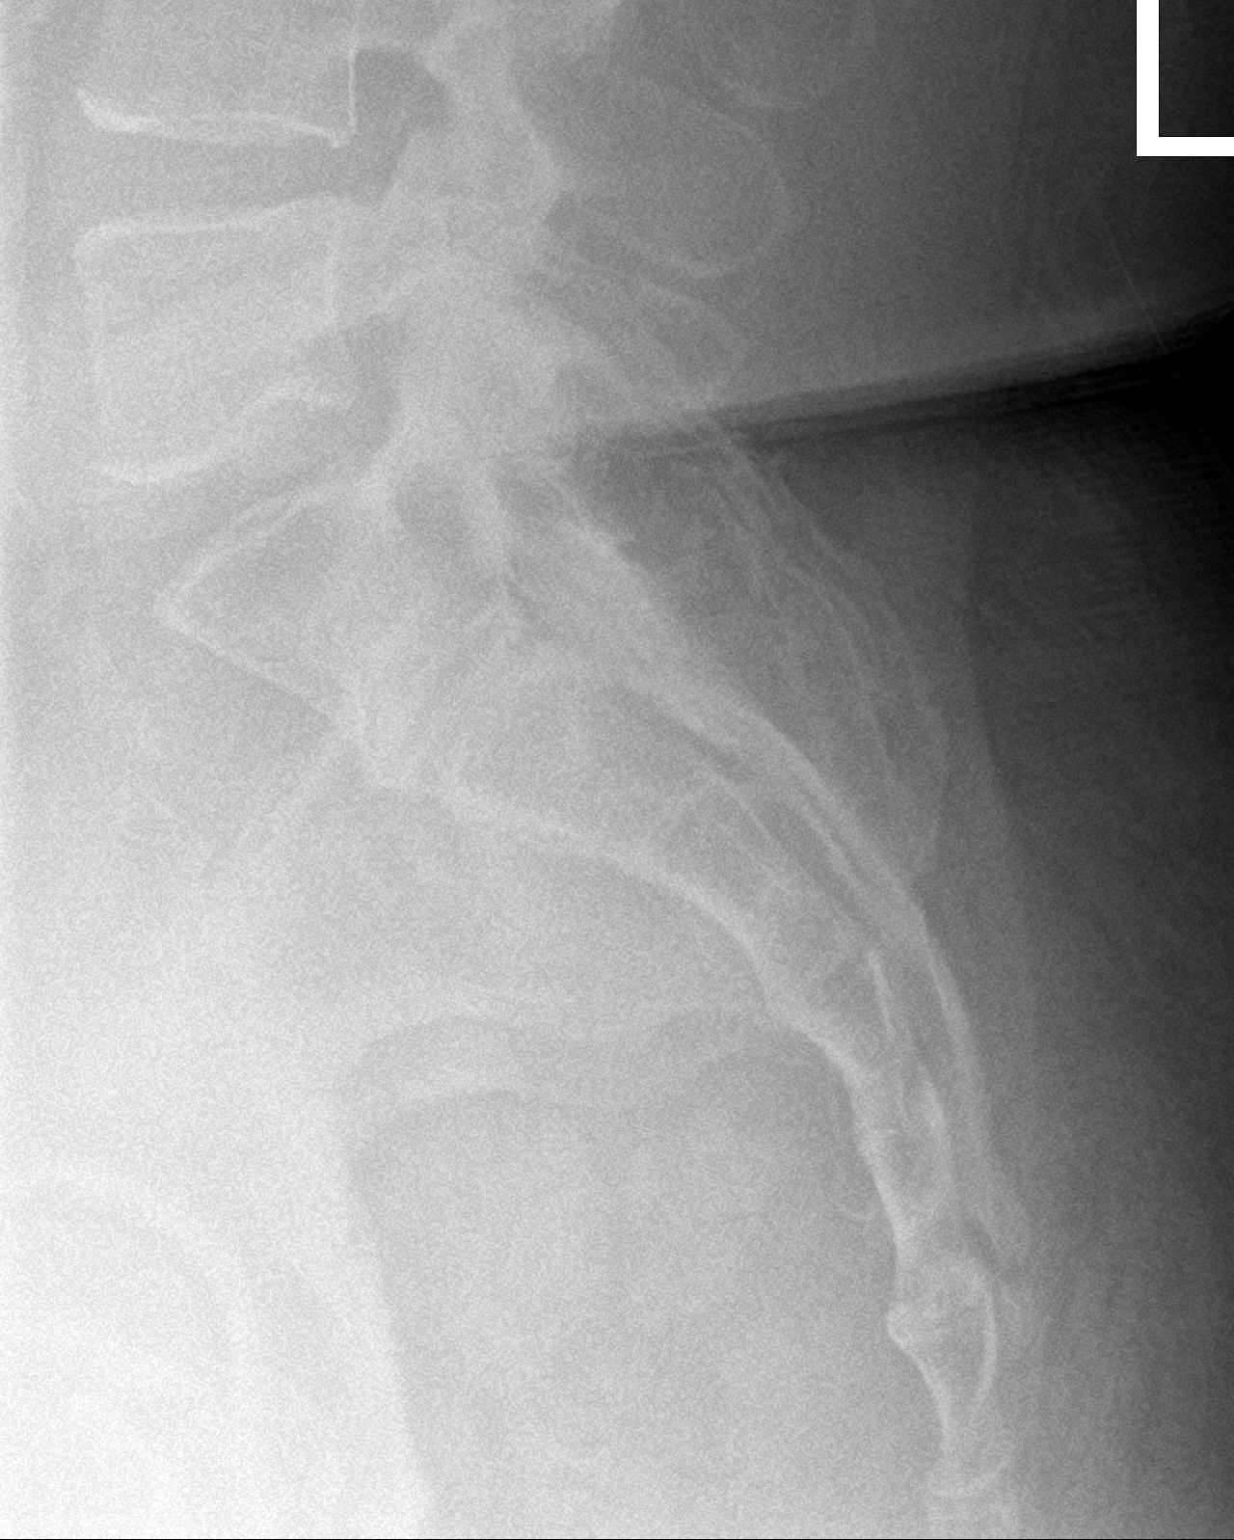

[3 of 3 positions shown; findings below may reference images not displayed]

FINDINGS: Normal alignment of the lumbar vertebral bodies. Disc spaces and
vertebral bodies are maintained. The facets are normally aligned. No
pars defects. The visualized bony pelvis is intact.
IMPRESSION: Normal alignment and no acute bony findings.

## 2022-01-13 ENCOUNTER — Encounter: Payer: Self-pay | Admitting: Infectious Diseases

## 2022-01-13 ENCOUNTER — Other Ambulatory Visit
Admission: RE | Admit: 2022-01-13 | Discharge: 2022-01-13 | Disposition: A | Discharge status: Home / Self Care | Payer: BC Managed Care – PPO | Source: Ambulatory Visit | Attending: Infectious Diseases | Admitting: Infectious Diseases

## 2022-01-13 ENCOUNTER — Telehealth: Payer: Self-pay

## 2022-01-13 ENCOUNTER — Ambulatory Visit: Payer: BC Managed Care – PPO | Attending: Infectious Diseases | Admitting: Infectious Diseases

## 2022-01-13 ENCOUNTER — Other Ambulatory Visit (HOSPITAL_COMMUNITY): Payer: Self-pay

## 2022-01-13 VITALS — BP 129/76 | HR 68 | Temp 97.9°F

## 2022-01-13 DIAGNOSIS — T8744 Infection of amputation stump, left lower extremity: Secondary | ICD-10-CM

## 2022-01-13 DIAGNOSIS — D649 Anemia, unspecified: Secondary | ICD-10-CM | POA: Insufficient documentation

## 2022-01-13 DIAGNOSIS — N1831 Chronic kidney disease, stage 3a: Secondary | ICD-10-CM | POA: Diagnosis not present

## 2022-01-13 DIAGNOSIS — A4902 Methicillin resistant Staphylococcus aureus infection, unspecified site: Secondary | ICD-10-CM | POA: Diagnosis not present

## 2022-01-13 DIAGNOSIS — F319 Bipolar disorder, unspecified: Secondary | ICD-10-CM | POA: Diagnosis not present

## 2022-01-13 DIAGNOSIS — E1151 Type 2 diabetes mellitus with diabetic peripheral angiopathy without gangrene: Secondary | ICD-10-CM | POA: Diagnosis not present

## 2022-01-13 DIAGNOSIS — F17210 Nicotine dependence, cigarettes, uncomplicated: Secondary | ICD-10-CM | POA: Diagnosis not present

## 2022-01-13 DIAGNOSIS — Z794 Long term (current) use of insulin: Secondary | ICD-10-CM | POA: Insufficient documentation

## 2022-01-13 DIAGNOSIS — I872 Venous insufficiency (chronic) (peripheral): Secondary | ICD-10-CM | POA: Diagnosis not present

## 2022-01-13 DIAGNOSIS — Z89511 Acquired absence of right leg below knee: Secondary | ICD-10-CM

## 2022-01-13 DIAGNOSIS — Z89512 Acquired absence of left leg below knee: Secondary | ICD-10-CM | POA: Diagnosis not present

## 2022-01-13 DIAGNOSIS — Z4781 Encounter for orthopedic aftercare following surgical amputation: Secondary | ICD-10-CM | POA: Diagnosis not present

## 2022-01-13 DIAGNOSIS — I10 Essential (primary) hypertension: Secondary | ICD-10-CM | POA: Insufficient documentation

## 2022-01-13 DIAGNOSIS — C50911 Malignant neoplasm of unspecified site of right female breast: Secondary | ICD-10-CM | POA: Insufficient documentation

## 2022-01-13 DIAGNOSIS — E119 Type 2 diabetes mellitus without complications: Secondary | ICD-10-CM | POA: Diagnosis not present

## 2022-01-13 DIAGNOSIS — F419 Anxiety disorder, unspecified: Secondary | ICD-10-CM | POA: Diagnosis not present

## 2022-01-13 DIAGNOSIS — I5032 Chronic diastolic (congestive) heart failure: Secondary | ICD-10-CM | POA: Diagnosis not present

## 2022-01-13 DIAGNOSIS — Z9011 Acquired absence of right breast and nipple: Secondary | ICD-10-CM | POA: Diagnosis not present

## 2022-01-13 DIAGNOSIS — Z8673 Personal history of transient ischemic attack (TIA), and cerebral infarction without residual deficits: Secondary | ICD-10-CM | POA: Diagnosis not present

## 2022-01-13 DIAGNOSIS — J449 Chronic obstructive pulmonary disease, unspecified: Secondary | ICD-10-CM | POA: Diagnosis not present

## 2022-01-13 DIAGNOSIS — E1122 Type 2 diabetes mellitus with diabetic chronic kidney disease: Secondary | ICD-10-CM | POA: Diagnosis not present

## 2022-01-13 DIAGNOSIS — E782 Mixed hyperlipidemia: Secondary | ICD-10-CM | POA: Diagnosis not present

## 2022-01-13 DIAGNOSIS — B9562 Methicillin resistant Staphylococcus aureus infection as the cause of diseases classified elsewhere: Secondary | ICD-10-CM | POA: Diagnosis not present

## 2022-01-13 DIAGNOSIS — B964 Proteus (mirabilis) (morganii) as the cause of diseases classified elsewhere: Secondary | ICD-10-CM | POA: Insufficient documentation

## 2022-01-13 DIAGNOSIS — E1169 Type 2 diabetes mellitus with other specified complication: Secondary | ICD-10-CM | POA: Diagnosis not present

## 2022-01-13 DIAGNOSIS — I130 Hypertensive heart and chronic kidney disease with heart failure and stage 1 through stage 4 chronic kidney disease, or unspecified chronic kidney disease: Secondary | ICD-10-CM | POA: Diagnosis not present

## 2022-01-13 LAB — CBC WITH DIFFERENTIAL/PLATELET
Abs Immature Granulocytes: 0.03 10*3/uL (ref 0.00–0.07)
Basophils Absolute: 0.1 10*3/uL (ref 0.0–0.1)
Basophils Relative: 1 %
Eosinophils Absolute: 0.2 10*3/uL (ref 0.0–0.5)
Eosinophils Relative: 5 %
HCT: 30.7 % — ABNORMAL LOW (ref 36.0–46.0)
Hemoglobin: 9.3 g/dL — ABNORMAL LOW (ref 12.0–15.0)
Immature Granulocytes: 1 %
Lymphocytes Relative: 16 %
Lymphs Abs: 0.9 10*3/uL (ref 0.7–4.0)
MCH: 23.6 pg — ABNORMAL LOW (ref 26.0–34.0)
MCHC: 30.3 g/dL (ref 30.0–36.0)
MCV: 77.9 fL — ABNORMAL LOW (ref 80.0–100.0)
Monocytes Absolute: 0.3 10*3/uL (ref 0.1–1.0)
Monocytes Relative: 6 %
Neutro Abs: 3.8 10*3/uL (ref 1.7–7.7)
Neutrophils Relative %: 71 %
Platelets: 183 10*3/uL (ref 150–400)
RBC: 3.94 MIL/uL (ref 3.87–5.11)
RDW: 17.1 % — ABNORMAL HIGH (ref 11.5–15.5)
WBC: 5.3 10*3/uL (ref 4.0–10.5)
nRBC: 0 % (ref 0.0–0.2)

## 2022-01-13 LAB — COMPREHENSIVE METABOLIC PANEL
ALT: 10 U/L (ref 0–44)
AST: 12 U/L — ABNORMAL LOW (ref 15–41)
Albumin: 2.9 g/dL — ABNORMAL LOW (ref 3.5–5.0)
Alkaline Phosphatase: 127 U/L — ABNORMAL HIGH (ref 38–126)
Anion gap: 4 — ABNORMAL LOW (ref 5–15)
BUN: 23 mg/dL — ABNORMAL HIGH (ref 6–20)
CO2: 26 mmol/L (ref 22–32)
Calcium: 9 mg/dL (ref 8.9–10.3)
Chloride: 106 mmol/L (ref 98–111)
Creatinine, Ser: 0.71 mg/dL (ref 0.44–1.00)
GFR, Estimated: >60 mL/min (ref >60)
Glucose, Bld: 243 mg/dL — ABNORMAL HIGH (ref 70–99)
Potassium: 4.4 mmol/L (ref 3.5–5.1)
Sodium: 136 mmol/L (ref 135–145)
Total Bilirubin: 0.9 mg/dL (ref 0.3–1.2)
Total Protein: 8.8 g/dL — ABNORMAL HIGH (ref 6.5–8.1)

## 2022-01-13 MED ORDER — NUZYRA 150 MG PO TABS
ORAL_TABLET | ORAL | 0 refills | Status: DC
  Filled 2022-01-13: qty 30, 14d supply, fill #0

## 2022-01-13 NOTE — Telephone Encounter (Signed)
Form placed in your box for review. I have been calling patient to get appointment rescheduled but not able to reach.

## 2022-01-13 NOTE — Telephone Encounter (Signed)
[employer] or now known as [facility] is calling in asking if we have received a form to continue with wound vac orders. Asked for a call back if we have received this or not.

## 2022-01-13 NOTE — Progress Notes (Signed)
NAME: [name]  DOB: Oct 19, 1970  MRN: [id number]  Date/Time: 01/13/2022 10:45 AM   Subjective:   Patient is here with her husband Elva Mauro is a 51 y.o. female with a history of diabetes mellitus, right breast carcinoma status postmastectomy on anastrozole, hypertension, nicotine dependence, COPD, CVA, recurrent MRSA skin abscesses, bilateral BKA is here for follow-up for the left stump infection.  She was in the hospital in June 2023 for left BKA stump infection.  Culture was positive for MRSA and Proteus.  Patient initially was discharged over the weekend because of nonavailability of p.o. medications except linezolid which she was not able to take because of multiple SSRI she was called into the hospital.  But she left AMA in 1 day.  So omadacycline 200 mg daily for 26 days was mailed to her.  She has been taking the medicines and she is doing very well The wound is healing well.  She has a wound VAC.  No fever or chills No side effects from medication. Bilateral being Past Medical History:  Diagnosis Date   Anxiety    takes Prozac daily   Anxiety    Aortic valve calcification    Asthma    Advair and Spirva daily   Asthma    Bipolar disorder (HCC)    Breast cancer, right breast ([place]) 12/23/2019   CAD (coronary artery disease)    a. LHC 11/2013 done for CP/fluid retention: mild disease in prox LAD, mild-mod disease in mRCA, EF 60% with normal LVEDP. b. Normal nuc 03/2016.   Cancer ([place])    Cellulitis and abscess of trunk 03/12/2020   CHF (congestive heart failure) (HCC)    Chronic diastolic CHF (congestive heart failure) (HCC)    Chronic heart failure with preserved ejection fraction ([place]) 11/16/2018   CKD (chronic kidney disease), stage II    COPD (chronic obstructive pulmonary disease) (HCC)    a. nocturnal O2.   COPD (chronic obstructive pulmonary disease) (HCC)    Coronary artery disease    Decreased urine stream    Diabetes mellitus    Diabetes mellitus without complication  (HCC)    Dyspnea    Elevated C-reactive protein (CRP) 01/29/2018   Elevated sedimentation rate 01/29/2018   Elevated serum hCG 01/19/2018   Family history of adverse reaction to anesthesia    mom gets nauseated   Foot pain, bilateral 05/22/2018   GERD (gastroesophageal reflux disease)    takes Pepcid daily   History of blood clots    left leg 3-26yr ago   Hyperlipidemia    Hypertension    Hypertriglyceridemia    Inguinal hernia, left 01/2015   Muscle spasm    Open wound of genital labia    Peripheral neuropathy    RBBB    [place]ures [place]    [place]   Sinus tachycardia    a. persistent since 2009.   Smokers' cough ([place]    Status post mastectomy, right 01/14/2020   Stroke [facility] 1989   left sided weakness   TIA (transient ischemic attack)    Tobacco abuse    Vulvar abscess 01/23/2018    Past Surgical History:  Procedure Laterality Date   AMPUTATION Left 10/11/2021   Procedure: AMPUTATION BELOW KNEE;  Surgeon: DAlgernon Huxley MD;  Location: [facility];  Service: General;  Laterality: Left;   AMPUTATION Right 11/15/2021   Procedure: BELOW KNEE AMPUTATION;  Surgeon: SKatha Cabal MD;  Location: [facility];  Service: Vascular;  Laterality: Right;  APPLICATION OF WOUND VAC Right 01/29/2020   Procedure: APPLICATION OF WOUND VAC;  Surgeon: Ronny Bacon, MD;  Location: [facility];  Service: General;  Laterality: Right;   APPLICATION OF WOUND VAC Left 11/15/2021   Procedure: APPLICATION OF WOUND VAC;  Surgeon: Katha Cabal, MD;  Location: [facility];  Service: Vascular;  Laterality: Left;   BREAST BIOPSY Right 11/06/2019   [place] core path pending venus clip   GRAFT APPLICATION  0/86/5784   Procedure: GRAFT APPLICATION;  Surgeon: [name], DPM;  Location: [facility];  Service: Podiatry;;   HERNIA REPAIR Left    I & D EXTREMITY Left 11/05/2021   Procedure: IRRIGATION AND DEBRIDEMENT LEFT BELOW KNEE STUMP;  Surgeon: Katha Cabal, MD;  Location: [facility];  Service:  Vascular;  Laterality: Left;   INCISION AND DRAINAGE Bilateral 09/21/2021   Procedure: INCISION AND DRAINAGE;  Surgeon: [name], DPM;  Location: [facility];  Service: Podiatry;  Laterality: Bilateral;   INCISION AND DRAINAGE ABSCESS N/A 01/26/2018   Procedure: INCISION AND DEBRIDEMENT OF VULVAR NECROTIZING SOFT TISSUE INFECTION;  Surgeon: Greer Pickerel, MD;  Location: [place];  Service: General;  Laterality: N/A;   INCISION AND DRAINAGE ABSCESS N/A 07/21/2021   Procedure: INCISION AND DRAINAGE ABSCESS Scalp;  Surgeon: Robert Bellow, MD;  Location: [place] [facility];  Service: General;  Laterality: N/A;  scalp and right abdomen   INCISION AND DRAINAGE PERIRECTAL ABSCESS N/A 01/22/2018   Procedure: IRRIGATION AND DEBRIDEMENT LABIAL/VULVAR AREA;  Surgeon: Coralie Keens, MD;  Location: [place];  Service: General;  Laterality: N/A;   INCISION AND DRAINAGE PERIRECTAL ABSCESS N/A 01/29/2018   Procedure: IRRIGATION AND DEBRIDEMENT VULVA;  Surgeon: Excell Seltzer, MD;  Location: [place];  Service: General;  Laterality: N/A;   INGUINAL HERNIA REPAIR Left 04/08/2015   Procedure: OPEN LEFT INGUINAL HERNIA REPAIR WITH MESH;  Surgeon: Ralene Ok, MD;  Location: [place];  Service: General;  Laterality: Left;   INSERTION OF MESH Left 04/08/2015   Procedure: INSERTION OF MESH;  Surgeon: Ralene Ok, MD;  Location: [place];  Service: General;  Laterality: Left;   IRRIGATION AND DEBRIDEMENT ABSCESS N/A 07/21/2021   Procedure: IRRIGATION AND DEBRIDEMENT ABSCESS abdomen;  Surgeon: Robert Bellow, MD;  Location: [facility];  Service: General;  Laterality: N/A;   LAPAROSCOPY     Endometriosis   LEFT HEART CATHETERIZATION WITH CORONARY ANGIOGRAM N/A 12/05/2013   Procedure: LEFT HEART CATHETERIZATION WITH CORONARY ANGIOGRAM;  Surgeon: Jettie Booze, MD;  Location: [facility] C[facility];  Service: Cardiovascular;  Laterality: N/A;   LOWER EXTREMITY ANGIOGRAPHY Left 09/22/2021   Procedure: Lower Extremity Angiography;   Surgeon: Katha Cabal, MD;  Location: [place] CV LAB;  Service: Cardiovascular;  Laterality: Left;   LOWER EXTREMITY ANGIOGRAPHY Right 11/09/2021   Procedure: Lower Extremity Angiography;  Surgeon: Katha Cabal, MD;  Location: [place] CV LAB;  Service: Cardiovascular;  Laterality: Right;   MASTECTOMY Right    MINOR APPLICATION OF WOUND VAC Left 11/05/2021   Procedure: MINOR APPLICATION OF WOUND VAC;  Surgeon: Katha Cabal, MD;  Location: [facility];  Service: Vascular;  Laterality: Left;   right kidney drained     SIMPLE MASTECTOMY WITH AXILLARY SENTINEL NODE BIOPSY Right 12/23/2019   Procedure: Right SIMPLE MASTECTOMY WITH AXILLARY SENTINEL NODE BIOPSY;  Surgeon: Ronny Bacon, MD;  Location: [facility];  Service: General;  Laterality: Right;   TEE WITHOUT CARDIOVERSION N/A 11/28/2013   Procedure: TRANSESOPHAGEAL ECHOCARDIOGRAM (TEE);  Surgeon: Wonda Cheng  Nahser, MD;  Location: [place] [facility];  Service: Cardiovascular;  Laterality: N/A;   TEE WITHOUT CARDIOVERSION N/A 10/13/2021   Procedure: TRANSESOPHAGEAL ECHOCARDIOGRAM (TEE);  Surgeon: Corey Skains, MD;  Location: [facility];  Service: Cardiovascular;  Laterality: N/A;   WOUND DEBRIDEMENT Right 01/29/2020   Procedure: DEBRIDEMENT WOUND, Excisional debridement skin, subcutaneous and muscle right chest wall;  Surgeon: Ronny Bacon, MD;  Location: [facility];  Service: General;  Laterality: Right;    Social History   Socioeconomic History   Marital status: Married    Spouse name: Not on file   Number of children: 2   Years of education: Not on file   Highest education level: Not on file  Occupational History   Not on file  Tobacco Use   Smoking status: Every Day    Packs/day: 0.25    Years: 36.00    Total pack years: 9.00    Types: Cigarettes    Start date: 07/11/1981   Smokeless tobacco: Never   Tobacco comments:    1 ppd now  Vaping Use   Vaping Use: Some days  Substance and Sexual Activity   Alcohol use:  No   Drug use: No   Sexual activity: Not Currently  Other Topics Concern   Not on file  Social History Narrative   ** Merged History Encounter **       Lives in [place]   Married but lives with Boyfriend - not legally separated   Disabled - for BiPolar, Seizure disorder, diabetes   Formerly worked at [employer] 1 ppd; no alcohol. 2 step sons; no biologic children.       Social Determinants of Health   Financial Resource Strain: Not on file  Food Insecurity: No Food Insecurity (02/15/2021)   Hunger Vital Sign    Worried About Running Out of Food in the Last Year: Never true    Ran Out of Food in the Last Year: Never true  Transportation Needs: Not on file  Physical Activity: Unknown (09/14/2018)   Exercise Vital Sign    Days of Exercise per Week: Patient refused    Minutes of Exercise per Session: Patient refused  Stress: No Stress Concern Present (09/14/2018)   [employer] [place]    Feeling of Stress : Only a little  Social Connections: Unknown (09/14/2018)   Social Connection and Isolation Panel [NHANES]    Frequency of Communication with Friends and Family: More than three times a week    Frequency of Social Gatherings with Friends and Family: Patient refused    Attends Religious Services: Patient refused    Marine scientist or Organizations: Patient refused    Attends Archivist Meetings: Patient refused    Marital Status: Married  Human resources officer Violence: Unknown (09/14/2018)   Humiliation, Afraid, Rape, and Kick questionnaire    Fear of Current or Ex-Partner: Patient refused    Emotionally Abused: Patient refused    Physically Abused: Patient refused    Sexually Abused: Patient refused    Family History  Problem Relation Age of Onset   Venous thrombosis Brother    Other Brother        BRAIN TUMOR   Asthma Father    Diabetes Father    Coronary artery disease Mother    Hypertension  Mother    Diabetes Mother    Breast cancer Mother 75   Asthma Sister    Diabetes type II  Brother    Diabetes Brother    Allergies  Allergen Reactions   Adhesive [Tape] Rash and Other (See Comments)    TAKES OFF THE SKIN (CERTAIN MEDICAL TAPES DO THIS!!)   Metoprolol Shortness Of Breath    Occurrence of shortness of breath after 3 days   Montelukast Shortness Of Breath   Morphine Sulfate Anaphylaxis, Shortness Of Breath and Nausea And Vomiting    Swollen Throat - Able to tolerate dilaudid   Penicillins Anaphylaxis, Hives and Shortness Of Breath    Throat swells Has patient had a PCN reaction causing immediate rash, facial/tongue/throat swelling, SOB or lightheadedness with hypotension: Yes Has patient had a PCN reaction causing severe rash involving mucus membranes or skin necrosis: No Has patient had a PCN reaction that required hospitalization: Yes Has patient had a PCN reaction occurring within the last 10 years: No If all of the above answers are "NO", then may proceed with Cephalosporin use.    Silicone Rash    Takes off the skin (certain medical tapes do this)   Diltiazem Swelling   Gabapentin Swelling   Midodrine     Lightheaded and falling down   Percocet [Oxycodone-Acetaminophen] Rash   I? Current Outpatient Medications  Medication Sig Dispense Refill   acetaminophen (TYLENOL) 325 MG tablet Take 2 tablets (650 mg total) by mouth every 4 (four) hours as needed for headache or mild pain.     albuterol (VENTOLIN HFA) 108 (90 Base) MCG/ACT inhaler Inhale 2 puffs into the lungs every 6 (six) hours as needed for wheezing or shortness of breath.     ALPRAZolam (XANAX) 1 MG tablet Take 1 tablet (1 mg total) by mouth 4 (four) times daily as needed for anxiety. 5 tablet 0   anastrozole (ARIMIDEX) 1 MG tablet Take 1 tablet (1 mg total) by mouth daily. 30 tablet 3   aspirin EC 81 MG tablet Take 81 mg by mouth daily before breakfast.     budesonide-formoterol (SYMBICORT) 80-4.5  MCG/ACT inhaler INHALE 2 PUFFS BY MOUTH EVERY 12 HOURS TO PREVENT COUGH OR WHEEZING *RINSE MOUTH AFTER EACH USE* 10.2 g 2   busPIRone (BUSPAR) 30 MG tablet Take 30 mg by mouth 2 (two) times daily.     citalopram (CELEXA) 20 MG tablet Take 1 tablet (20 mg total) by mouth daily. 30 tablet 2   desvenlafaxine (PRISTIQ) 100 MG 24 hr tablet Take 100 mg by mouth daily.     EYLEA 2 MG/0.05ML SOSY      insulin aspart (NOVOLOG) 100 UNIT/ML FlexPen Inject 1-9 Units into the skin 4 (four) times daily -  before meals and at bedtime. Glucose 121 - 150: 1 unit, Glucose 151 - 200: 2 units, Glucose 201 - 250: 3 units, Glucose 251 - 300: 5 units, Glucose 301 - 350: 7 units, Glucose 351 - 400: 9 units, Glucose > 400 call MD 15 mL 11   insulin glargine (LANTUS) 100 UNIT/ML Solostar Pen Inject 35 Units into the skin daily. 15 mL 11   Omadacycline Tosylate (NUZYRA) 150 MG TABS Take 3 tablets by mouth on days 1 and 2 and then take 2 tablets by mouth daily thereafter. Take on an empty stomach. 54 tablet 0   ondansetron (ZOFRAN) 8 MG tablet One pill every 8 hours as needed for nausea/vomitting. 40 tablet 1   oxyCODONE (OXY IR/ROXICODONE) 5 MG immediate release tablet Take 1 tablet (5 mg total) by mouth every 6 (six) hours as needed for severe pain. 30 tablet 0  OXYGEN Inhale 2-4 L into the lungs 2 (two) times daily as needed (shortness of breath).     pregabalin (LYRICA) 300 MG capsule TAKE 1 CAPSULE BY MOUTH TWICE DAILY FOR  NERVE  PAIN 180 capsule 0   rOPINIRole (REQUIP) 1 MG tablet TAKE 1 TABLET BY MOUTH ONCE DAILY AT BEDTIME FOR  RESTLESS  LEG 90 tablet 0   rosuvastatin (CRESTOR) 10 MG tablet Take 1 tablet (10 mg total) by mouth at bedtime. 30 tablet 0   spironolactone (ALDACTONE) 50 MG tablet Take 50 mg by mouth daily.     topiramate (TOPAMAX) 50 MG tablet TAKE 1 TABLET BY MOUTH TWICE DAILY FOR HEADACHE PREVENTION 180 tablet 0   traZODone (DESYREL) 100 MG tablet Take 200 mg by mouth at bedtime.     naloxone (NARCAN)  nasal spray 4 mg/0.1 mL SMARTSIG:Spray(s) In Nostril     No current facility-administered medications for this visit.     Abtx:  Anti-infectives (From admission, onward)    None       REVIEW OF SYSTEMS:  Const: negative fever, negative chills, negative weight loss Eyes: negative diplopia or visual changes, negative eye pain ENT: negative coryza, negative sore throat Resp: negative cough, hemoptysis, dyspnea Cards: negative for chest pain, palpitations, lower extremity edema GU: negative for frequency, dysuria and hematuria GI: Negative for abdominal pain, diarrhea, bleeding, constipation Skin: negative for rash and pruritus Heme: negative for easy bruising and gum/nose bleeding MS: Bilateral BKA.  In wheelchair.  Neurolo:negative for headaches, dizziness, vertigo, memory problems  Psych: negative for feelings of anxiety, depression  Endocrine:  diabetes Allergy/Immunology-as above Objective:  VITALS:  BP 129/76   Pulse 68   Temp 97.9 F (36.6 C) (Temporal)   LMP  (LMP Unknown)   PHYSICAL EXAM:  General: Alert, cooperative, no distress, appears stated age.  Head: Normocephalic, without obvious abnormality, atraumatic. Eyes: Conjunctivae clear, anicteric sclerae. Pupils are equal ENT Nares normal. No drainage or sinus tenderness. Edentulous did not Neck: Supple, symmetrical, no adenopathy, thyroid: non tender no carotid bruit and no JVD. Back: No CVA tenderness. Lungs: Clear to auscultation bilaterally. No Wheezing or Rhonchi. No rales. Heart: Regular rate and rhythm, no murmur, rub or gallop. Abdomen: Not examined as she was in the wheelchair Extremities: Right BKA site clean Left BKA site wound VAC was removed  wound laterally is much smaller than prior.  Has healthy granulation tissue.  On the medial aspect there is a scab at home and when I lifted the scab there was some slough underneath.  Culture was taken.       Lymph: Cervical, supraclavicular  normal. Neurologic: Grossly non-focal Pertinent Labs none  ? Impression/Recommendation ? Left BKA stump infection with Proteus and MRSA Patient completed 24 days of omadacycline on 01/11/2022. There is an area on the medial aspect of the BKA which is still has a scab with underlying possible infection.  Culture has been sent.  We will give her 2 more weeks of omadacycline.  The lateral wound is healthy and granulating.  Surrounding has some maceration secondary to the wound VAC. We will discuss with vascular to see if  wet-to-dry dressing would suffice  Right BKA site i is good  Patient on multiple antidepressants including BuSpar, Celexa, trazodone and venlafaxine  Diabetes mellitus on insulin  CAP status post right mastectomy on anastrozole  Anemia ___________________________________________________ Discussed with patient, and her husband. Follow-up in 4 weeks note:  This document was prepared using Systems analyst and may  include unintentional dictation errors.

## 2022-01-13 NOTE — Patient Instructions (Signed)
You are here for follow up of trhe left stump infection- doing labs and culture. Will send prescription for omadacycline

## 2022-01-13 NOTE — Telephone Encounter (Addendum)
I reviewed some recent office notes from vascular surgery in her chart, please send wound VAC orders to vascular surgery as it looks like they are helping to manage.  I have not seen patient in 6+ months and am not up-to-date on her amputations or wounds.

## 2022-01-14 NOTE — Progress Notes (Incomplete)
NAMESkylynne Foster  DOB: [birth year]  MRN: [id number]  Date/Time: 01/13/2022 10:45 AM   Subjective:     [name] is a 51 y.o. female with a history of diabetes mellitus, right breast carcinoma status postmastectomy on anastrozole, hypertension, nicotine dependence, COPD, CVA, recurrent MRSA skin abscesses, bilateral BKA is here for follow-up for the left stump infection.  She was in the hospital in June 2023 for left BKA stump infection for   ID   Steroid/immune suppressants/splenectomy/Hardware Recent Procedure Surgery Injections Trauma Sick contacts Travel Antibiotic use Food- raw/exotic Animal bites Tick exposure Water sports Fishing/hunting/animal bird exposure Past Medical History:  Diagnosis Date  . Anxiety    takes Prozac daily  . Anxiety   . Aortic valve calcification   . Asthma    Advair and Spirva daily  . Asthma   . Bipolar disorder ([place])   . Breast cancer, right breast ([place]) 12/23/2019  . CAD (coronary artery disease)    a. LHC 11/2013 done for CP/fluid retention: mild disease in prox LAD, mild-mod disease in mRCA, EF 60% with normal LVEDP. b. Normal nuc 03/2016.  Marland Kitchen Cancer ([place])   . Cellulitis and abscess of trunk 03/12/2020  . CHF (congestive heart failure) ([place])   . Chronic diastolic CHF (congestive heart failure) ([place])   . Chronic heart failure with preserved ejection fraction ([place]) 11/16/2018  . CKD (chronic kidney disease), stage II   . COPD (chronic obstructive pulmonary disease) (H[facility])    a. nocturnal O2.  Marland Kitchen COPD (chronic obstructive pulmonary disease) ([place])   . Coronary artery disease   . Decreased urine stream   . Diabetes mellitus   . Diabetes mellitus without complication ([place])   . Dyspnea   . Elevated C-reactive protein (CRP) 01/29/2018  . Elevated sedimentation rate 01/29/2018  . Elevated serum hCG 01/19/2018  . Family history of adverse reaction to anesthesia    mom gets nauseated  . Foot pain, bilateral 05/22/2018  . GERD (gastroesophageal  reflux disease)    takes Pepcid daily  . History of blood clots    left leg 3-4yr ago  . Hyperlipidemia   . Hypertension   . Hypertriglyceridemia   . Inguinal hernia, left 01/2015  . Muscle spasm   . Open wound of genital labia   . Peripheral neuropathy   . RBBB   . Seizures ([place]   . Sepsis ([place] 01/19/2018  . Sinus tachycardia    a. persistent since 2009.  .Marland KitchenSmokers' cough ([place]   . Status post mastectomy, right 01/14/2020  . Stroke ([name] 1989   left sided weakness  . TIA (transient ischemic attack)   . Tobacco abuse   . Vulvar abscess 01/23/2018    Past Surgical History:  Procedure Laterality Date  . AMPUTATION Left 10/11/2021   Procedure: AMPUTATION BELOW KNEE;  Surgeon: DAlgernon Huxley MD;  Location: [facility];  Service: General;  Laterality: Left;  . AMPUTATION Right 11/15/2021   Procedure: BELOW KNEE AMPUTATION;  Surgeon: SKatha Cabal MD;  Location: [facility];  Service: Vascular;  Laterality: Right;  . APPLICATION OF WOUND VAC Right 01/29/2020   Procedure: APPLICATION OF WOUND VAC;  Surgeon: RRonny Bacon MD;  Location: [facility];  Service: General;  Laterality: Right;  . APPLICATION OF WOUND VAC Left 11/15/2021   Procedure: APPLICATION OF WOUND VAC;  Surgeon: SKatha Cabal MD;  Location: [facility];  Service: Vascular;  Laterality: Left;  . BREAST BIOPSY Right 11/06/2019   uKoreacore path pending venus clip  .  GRAFT APPLICATION  [id number]   Procedure: GRAFT APPLICATION;  Surgeon: [name], DPM;  Location: [facility];  Service: Podiatry;;  . HERNIA REPAIR Left   . I & D EXTREMITY Left 11/05/2021   Procedure: IRRIGATION AND DEBRIDEMENT LEFT BELOW KNEE STUMP;  Surgeon: Katha Cabal, MD;  Location: [facility];  Service: Vascular;  Laterality: Left;  . INCISION AND DRAINAGE Bilateral 09/21/2021   Procedure: INCISION AND DRAINAGE;  Surgeon: [name], DPM;  Location: [facility];  Service: Podiatry;  Laterality: Bilateral;  . INCISION AND DRAINAGE ABSCESS N/A  01/26/2018   Procedure: INCISION AND DEBRIDEMENT OF VULVAR NECROTIZING SOFT TISSUE INFECTION;  Surgeon: Greer Pickerel, MD;  Location: [place];  Service: General;  Laterality: N/A;  . INCISION AND DRAINAGE ABSCESS N/A 07/21/2021   Procedure: INCISION AND DRAINAGE ABSCESS Scalp;  Surgeon: Robert Bellow, MD;  Location: [facility];  Service: General;  Laterality: N/A;  scalp and right abdomen  . INCISION AND DRAINAGE PERIRECTAL ABSCESS N/A 01/22/2018   Procedure: IRRIGATION AND DEBRIDEMENT LABIAL/VULVAR AREA;  Surgeon: Coralie Keens, MD;  Location: [place];  Service: General;  Laterality: N/A;  . INCISION AND DRAINAGE PERIRECTAL ABSCESS N/A 01/29/2018   Procedure: IRRIGATION AND DEBRIDEMENT VULVA;  Surgeon: Excell Seltzer, MD;  Location: [place];  Service: General;  Laterality: N/A;  . INGUINAL HERNIA REPAIR Left 04/08/2015   Procedure: OPEN LEFT INGUINAL HERNIA REPAIR WITH MESH;  Surgeon: Ralene Ok, MD;  Location: [place];  Service: General;  Laterality: Left;  . INSERTION OF MESH Left 04/08/2015   Procedure: INSERTION OF MESH;  Surgeon: Ralene Ok, MD;  Location: [place];  Service: General;  Laterality: Left;  . IRRIGATION AND DEBRIDEMENT ABSCESS N/A 07/21/2021   Procedure: IRRIGATION AND DEBRIDEMENT ABSCESS abdomen;  Surgeon: Robert Bellow, MD;  Location: [facility];  Service: General;  Laterality: N/A;  . LAPAROSCOPY     Endometriosis  . LEFT HEART CATHETERIZATION WITH CORONARY ANGIOGRAM N/A 12/05/2013   Procedure: LEFT HEART CATHETERIZATION WITH CORONARY ANGIOGRAM;  Surgeon: Jettie Booze, MD;  Location: [facility] C[facility] LAB;  Service: Cardiovascular;  Laterality: N/A;  . LOWER EXTREMITY ANGIOGRAPHY Left 09/22/2021   Procedure: Lower Extremity Angiography;  Surgeon: Katha Cabal, MD;  Location: [place] CV LAB;  Service: Cardiovascular;  Laterality: Left;  . LOWER EXTREMITY ANGIOGRAPHY Right 11/09/2021   Procedure: Lower Extremity Angiography;  Surgeon: Katha Cabal, MD;   Location: [place] CV LAB;  Service: Cardiovascular;  Laterality: Right;  . MASTECTOMY Right   . MINOR APPLICATION OF WOUND VAC Left 11/05/2021   Procedure: MINOR APPLICATION OF WOUND VAC;  Surgeon: Katha Cabal, MD;  Location: [facility];  Service: Vascular;  Laterality: Left;  . right kidney drained    . SIMPLE MASTECTOMY WITH AXILLARY SENTINEL NODE BIOPSY Right 12/23/2019   Procedure: Right SIMPLE MASTECTOMY WITH AXILLARY SENTINEL NODE BIOPSY;  Surgeon: Ronny Bacon, MD;  Location: [facility];  Service: General;  Laterality: Right;  . TEE WITHOUT CARDIOVERSION N/A 11/28/2013   Procedure: TRANSESOPHAGEAL ECHOCARDIOGRAM (TEE);  Surgeon: Thayer Headings, MD;  Location: [place];  Service: Cardiovascular;  Laterality: N/A;  . TEE WITHOUT CARDIOVERSION N/A 10/13/2021   Procedure: TRANSESOPHAGEAL ECHOCARDIOGRAM (TEE);  Surgeon: Corey Skains, MD;  Location: [facility];  Service: Cardiovascular;  Laterality: N/A;  . WOUND DEBRIDEMENT Right 01/29/2020   Procedure: DEBRIDEMENT WOUND, Excisional debridement skin, subcutaneous and muscle right chest wall;  Surgeon: Ronny Bacon, MD;  Location: [facility];  Service: General;  Laterality:  Right;    Social History   Socioeconomic History  . Marital status: Married    Spouse name: Not on file  . Number of children: 2  . Years of education: Not on file  . Highest education level: Not on file  Occupational History  . Not on file  Tobacco Use  . Smoking status: Every Day    Packs/day: 0.25    Years: 36.00    Total pack years: 9.00    Types: Cigarettes    Start date: 07/11/1981  . Smokeless tobacco: Never  . Tobacco comments:    1 ppd now  Vaping Use  . Vaping Use: Some days  Substance and Sexual Activity  . Alcohol use: No  . Drug use: No  . Sexual activity: Not Currently  Other Topics Concern  . Not on file  Social History Narrative   ** Merged History Encounter **       Lives in [place]   Married but lives with Boyfriend  - not legally separated   Disabled - for Bi[employer]olar, Seizure disorder, diabetes   Formerly worked at [employer] 1 ppd; no alcohol. 2 step sons; no biologic children.       Social Determinants of Health   Financial Resource Strain: Not on file  Food Insecurity: No Food Insecurity (02/15/2021)   Hunger Vital Sign   . Worried About Charity fundraiser in the Last Year: Never true   . Ran Out of Food in the Last Year: Never true  Transportation Needs: Not on file  Physical Activity: Unknown (09/14/2018)   Exercise Vital Sign   . Days of Exercise per Week: Patient refused   . Minutes of Exercise per Session: Patient refused  Stress: No Stress Concern Present (09/14/2018)   [place]   . Feeling of Stress : Only a little  Social Connections: Unknown (09/14/2018)   Social Connection and Isolation Panel [NHANES]   . Frequency of Communication with Friends and Family: More than three times a week   . Frequency of Social Gatherings with Friends and Family: Patient refused   . Attends Religious Services: Patient refused   . Active Member of Clubs or Organizations: Patient refused   . Attends Archivist Meetings: Patient refused   . Marital Status: Married  Human resources officer Violence: Unknown (09/14/2018)   Humiliation, Afraid, Rape, and Kick questionnaire   . Fear of Current or Ex-Partner: Patient refused   . Emotionally Abused: Patient refused   . Physically Abused: Patient refused   . Sexually Abused: Patient refused    Family History  Problem Relation Age of Onset  . Venous thrombosis Brother   . Other Brother        BRAIN TUMOR  . Asthma Father   . Diabetes Father   . Coronary artery disease Mother   . Hypertension Mother   . Diabetes Mother   . Breast cancer Mother 63  . Asthma Sister   . Diabetes type II Brother   . Diabetes Brother    Allergies  Allergen Reactions  . Adhesive [Tape] Rash and Other  (See Comments)    TAKES OFF THE SKIN (CERTAIN MEDICAL TAPES DO THIS!!)  . Metoprolol Shortness Of Breath    Occurrence of shortness of breath after 3 days  . Montelukast Shortness Of Breath  . Morphine Sulfate Anaphylaxis, Shortness Of Breath and Nausea And Vomiting    Swollen Throat -  Able to tolerate dilaudid  . Penicillins Anaphylaxis, Hives and Shortness Of Breath    Throat swells Has patient had a PCN reaction causing immediate rash, facial/tongue/throat swelling, SOB or lightheadedness with hypotension: Yes Has patient had a PCN reaction causing severe rash involving mucus membranes or skin necrosis: No Has patient had a PCN reaction that required hospitalization: Yes Has patient had a PCN reaction occurring within the last 10 years: No If all of the above answers are "NO", then may proceed with Cephalosporin use.   . Silicone Rash    Takes off the skin (certain medical tapes do this)  . Diltiazem Swelling  . Gabapentin Swelling  . Midodrine     Lightheaded and falling down  . Percocet [Oxycodone-Acetaminophen] Rash   I  Current Outpatient Medications  Medication Sig Dispense Refill  . acetaminophen (TYLENOL) 325 MG tablet Take 2 tablets (650 mg total) by mouth every 4 (four) hours as needed for headache or mild pain.    Marland Kitchen albuterol (VENTOLIN HFA) 108 (90 Base) MCG/ACT inhaler Inhale 2 puffs into the lungs every 6 (six) hours as needed for wheezing or shortness of breath.    . ALPRAZolam (XANAX) 1 MG tablet Take 1 tablet (1 mg total) by mouth 4 (four) times daily as needed for anxiety. 5 tablet 0  . anastrozole (ARIMIDEX) 1 MG tablet Take 1 tablet (1 mg total) by mouth daily. 30 tablet 3  . aspirin EC 81 MG tablet Take 81 mg by mouth daily before breakfast.    . budesonide-formoterol (SYMBICORT) 80-4.5 MCG/ACT inhaler INHALE 2 PUFFS BY MOUTH EVERY 12 HOURS TO PREVENT COUGH OR WHEEZING *RINSE MOUTH AFTER EACH USE* 10.2 g 2  . busPIRone (BUSPAR) 30 MG tablet Take 30 mg by mouth 2  (two) times daily.    . citalopram (CELEXA) 20 MG tablet Take 1 tablet (20 mg total) by mouth daily. 30 tablet 2  . desvenlafaxine (PRISTIQ) 100 MG 24 hr tablet Take 100 mg by mouth daily.    Alfonse Flavors 2 MG/0.05ML SOSY     . insulin aspart (NOVOLOG) 100 UNIT/ML FlexPen Inject 1-9 Units into the skin 4 (four) times daily -  before meals and at bedtime. Glucose 121 - 150: 1 unit, Glucose 151 - 200: 2 units, Glucose 201 - 250: 3 units, Glucose 251 - 300: 5 units, Glucose 301 - 350: 7 units, Glucose 351 - 400: 9 units, Glucose > 400 call MD 15 mL 11  . insulin glargine (LANTUS) 100 UNIT/ML Solostar Pen Inject 35 Units into the skin daily. 15 mL 11  . Omadacycline Tosylate (NUZYRA) 150 MG TABS Take 3 tablets by mouth on days 1 and 2 and then take 2 tablets by mouth daily thereafter. Take on an empty stomach. 54 tablet 0  . ondansetron (ZOFRAN) 8 MG tablet One pill every 8 hours as needed for nausea/vomitting. 40 tablet 1  . oxyCODONE (OXY IR/ROXICODONE) 5 MG immediate release tablet Take 1 tablet (5 mg total) by mouth every 6 (six) hours as needed for severe pain. 30 tablet 0  . OXYGEN Inhale 2-4 L into the lungs 2 (two) times daily as needed (shortness of breath).    . pregabalin (LYRICA) 300 MG capsule TAKE 1 CAPSULE BY MOUTH TWICE DAILY FOR  NERVE  PAIN 180 capsule 0  . rOPINIRole (REQUIP) 1 MG tablet TAKE 1 TABLET BY MOUTH ONCE DAILY AT BEDTIME FOR  RESTLESS  LEG 90 tablet 0  . rosuvastatin (CRESTOR) 10 MG tablet Take 1  tablet (10 mg total) by mouth at bedtime. 30 tablet 0  . spironolactone (ALDACTONE) 50 MG tablet Take 50 mg by mouth daily.    Marland Kitchen topiramate (TOPAMAX) 50 MG tablet TAKE 1 TABLET BY MOUTH TWICE DAILY FOR HEADACHE PREVENTION 180 tablet 0  . traZODone (DESYREL) 100 MG tablet Take 200 mg by mouth at bedtime.    . naloxone (NARCAN) nasal spray 4 mg/0.1 mL SMARTSIG:Spray(s) In Nostril     No current facility-administered medications for this visit.     Abtx:  Anti-infectives (From  admission, onward)    None       REVIEW OF SYSTEMS:  Const: negative fever, negative chills, negative weight loss Eyes: negative diplopia or visual changes, negative eye pain ENT: negative coryza, negative sore throat Resp: negative cough, hemoptysis, dyspnea Cards: negative for chest pain, palpitations, lower extremity edema GU: negative for frequency, dysuria and hematuria GI: Negative for abdominal pain, diarrhea, bleeding, constipation Skin: negative for rash and pruritus Heme: negative for easy bruising and gum/nose bleeding MS: negative for myalgias, arthralgias, back pain and muscle weakness Neurolo:negative for headaches, dizziness, vertigo, memory problems  Psych: negative for feelings of anxiety, depression  Endocrine: negative for thyroid, diabetes Allergy/Immunology- negative for any medication or food allergies   Pertinent Positives include : Objective:  VITALS:  BP 129/76   Pulse 68   Temp 97.9 F (36.6 C) (Temporal)   LMP  (LMP Unknown)  LDA Foley Central line Other drainage tubes PHYSICAL EXAM:  General: Alert, cooperative, no distress, appears stated age.  Head: Normocephalic, without obvious abnormality, atraumatic. Eyes: Conjunctivae clear, anicteric sclerae. Pupils are equal ENT Nares normal. No drainage or sinus tenderness. Lips, mucosa, and tongue normal. No Thrush Neck: Supple, symmetrical, no adenopathy, thyroid: non tender no carotid bruit and no JVD. Back: No CVA tenderness. Lungs: Clear to auscultation bilaterally. No Wheezing or Rhonchi. No rales. Heart: Regular rate and rhythm, no murmur, rub or gallop. Abdomen: Soft, non-tender,not distended. Bowel sounds normal. No masses Extremities: atraumatic, no cyanosis. No edema. No clubbing Skin: No rashes or lesions. Or bruising Lymph: Cervical, supraclavicular normal. Neurologic: Grossly non-focal Pertinent Labs Lab Results CBC    Component Value Date/Time   WBC 6.7 12/14/2021 0800   RBC  3.38 (L) 12/14/2021 0800   HGB 8.2 (L) 12/14/2021 0800   HGB 12.2 09/16/2021 0847   HCT 27.5 (L) 12/14/2021 0800   HCT 38.0 09/16/2021 0847   PLT 248 12/14/2021 0800   PLT 200 09/16/2021 0847   MCV 81.4 12/14/2021 0800   MCV 86 09/16/2021 0847   MCH 24.3 (L) 12/14/2021 0800   MCHC 29.8 (L) 12/14/2021 0800   RDW 17.2 (H) 12/14/2021 0800   RDW 14.4 09/16/2021 0847   LYMPHSABS 1.1 12/13/2021 0524   LYMPHSABS 0.9 09/16/2021 0847   MONOABS 0.6 12/13/2021 0524   EOSABS 0.3 12/13/2021 0524   EOSABS 0.2 09/16/2021 0847   BASOSABS 0.1 12/13/2021 0524   BASOSABS 0.1 09/16/2021 0847       Latest Ref Rng & Units 12/13/2021    5:24 AM 12/12/2021    8:05 PM 12/11/2021    4:32 AM  CMP  Glucose 70 - 99 mg/dL 111  142    BUN 6 - 20 mg/dL 15  16    Creatinine 0.44 - 1.00 mg/dL 0.66  0.77  0.87   Sodium 135 - 145 mmol/L 138  139    Potassium 3.5 - 5.1 mmol/L 3.9  4.2    Chloride 98 - 111  mmol/L 109  107    CO2 22 - 32 mmol/L 26  26    Calcium 8.9 - 10.3 mg/dL 8.9  8.7    Total Protein 6.5 - 8.1 g/dL 8.0  8.6    Total Bilirubin 0.3 - 1.2 mg/dL 0.6  1.0    Alkaline Phos 38 - 126 U/L 126  137    AST 15 - 41 U/L 11  12    ALT 0 - 44 U/L 6  8        Microbiology: No results found for this or any previous visit (from the past 240 hour(s)).  IMAGING RESULTS: I have personally reviewed the films   Impression/Recommendation       ___________________________________________________ Discussed with patient, requesting provider Note:  This document was prepared using Dragon voice recognition software and may include unintentional dictation errors.

## 2022-01-14 NOTE — Telephone Encounter (Signed)
Called number provided in message. Was on hold for 14 minutes will have to call back at later time.

## 2022-01-15 DIAGNOSIS — E1151 Type 2 diabetes mellitus with diabetic peripheral angiopathy without gangrene: Secondary | ICD-10-CM | POA: Diagnosis not present

## 2022-01-15 DIAGNOSIS — J449 Chronic obstructive pulmonary disease, unspecified: Secondary | ICD-10-CM | POA: Diagnosis not present

## 2022-01-15 DIAGNOSIS — E782 Mixed hyperlipidemia: Secondary | ICD-10-CM | POA: Diagnosis not present

## 2022-01-15 DIAGNOSIS — T8744 Infection of amputation stump, left lower extremity: Secondary | ICD-10-CM | POA: Diagnosis not present

## 2022-01-15 DIAGNOSIS — F419 Anxiety disorder, unspecified: Secondary | ICD-10-CM | POA: Diagnosis not present

## 2022-01-15 DIAGNOSIS — F319 Bipolar disorder, unspecified: Secondary | ICD-10-CM | POA: Diagnosis not present

## 2022-01-15 DIAGNOSIS — E1122 Type 2 diabetes mellitus with diabetic chronic kidney disease: Secondary | ICD-10-CM | POA: Diagnosis not present

## 2022-01-15 DIAGNOSIS — Z4781 Encounter for orthopedic aftercare following surgical amputation: Secondary | ICD-10-CM | POA: Diagnosis not present

## 2022-01-15 DIAGNOSIS — I5032 Chronic diastolic (congestive) heart failure: Secondary | ICD-10-CM | POA: Diagnosis not present

## 2022-01-15 DIAGNOSIS — N1831 Chronic kidney disease, stage 3a: Secondary | ICD-10-CM | POA: Diagnosis not present

## 2022-01-15 DIAGNOSIS — E1169 Type 2 diabetes mellitus with other specified complication: Secondary | ICD-10-CM | POA: Diagnosis not present

## 2022-01-15 DIAGNOSIS — I130 Hypertensive heart and chronic kidney disease with heart failure and stage 1 through stage 4 chronic kidney disease, or unspecified chronic kidney disease: Secondary | ICD-10-CM | POA: Diagnosis not present

## 2022-01-15 DIAGNOSIS — I872 Venous insufficiency (chronic) (peripheral): Secondary | ICD-10-CM | POA: Diagnosis not present

## 2022-01-16 LAB — AEROBIC CULTURE W GRAM STAIN (SUPERFICIAL SPECIMEN)

## 2022-01-18 ENCOUNTER — Telehealth (INDEPENDENT_AMBULATORY_CARE_PROVIDER_SITE_OTHER): Payer: BC Managed Care – PPO | Admitting: Primary Care

## 2022-01-18 DIAGNOSIS — J449 Chronic obstructive pulmonary disease, unspecified: Secondary | ICD-10-CM | POA: Diagnosis not present

## 2022-01-18 DIAGNOSIS — I509 Heart failure, unspecified: Secondary | ICD-10-CM

## 2022-01-18 DIAGNOSIS — I130 Hypertensive heart and chronic kidney disease with heart failure and stage 1 through stage 4 chronic kidney disease, or unspecified chronic kidney disease: Secondary | ICD-10-CM | POA: Diagnosis not present

## 2022-01-18 DIAGNOSIS — F319 Bipolar disorder, unspecified: Secondary | ICD-10-CM | POA: Diagnosis not present

## 2022-01-18 DIAGNOSIS — G43709 Chronic migraine without aura, not intractable, without status migrainosus: Secondary | ICD-10-CM | POA: Diagnosis not present

## 2022-01-18 DIAGNOSIS — Z794 Long term (current) use of insulin: Secondary | ICD-10-CM

## 2022-01-18 DIAGNOSIS — E1165 Type 2 diabetes mellitus with hyperglycemia: Secondary | ICD-10-CM

## 2022-01-18 DIAGNOSIS — Z853 Personal history of malignant neoplasm of breast: Secondary | ICD-10-CM

## 2022-01-18 DIAGNOSIS — F419 Anxiety disorder, unspecified: Secondary | ICD-10-CM | POA: Diagnosis not present

## 2022-01-18 DIAGNOSIS — I5032 Chronic diastolic (congestive) heart failure: Secondary | ICD-10-CM | POA: Diagnosis not present

## 2022-01-18 DIAGNOSIS — E1169 Type 2 diabetes mellitus with other specified complication: Secondary | ICD-10-CM | POA: Diagnosis not present

## 2022-01-18 DIAGNOSIS — I2510 Atherosclerotic heart disease of native coronary artery without angina pectoris: Secondary | ICD-10-CM | POA: Diagnosis not present

## 2022-01-18 DIAGNOSIS — E1151 Type 2 diabetes mellitus with diabetic peripheral angiopathy without gangrene: Secondary | ICD-10-CM | POA: Diagnosis not present

## 2022-01-18 DIAGNOSIS — G2581 Restless legs syndrome: Secondary | ICD-10-CM

## 2022-01-18 DIAGNOSIS — E782 Mixed hyperlipidemia: Secondary | ICD-10-CM | POA: Diagnosis not present

## 2022-01-18 DIAGNOSIS — E1122 Type 2 diabetes mellitus with diabetic chronic kidney disease: Secondary | ICD-10-CM | POA: Diagnosis not present

## 2022-01-18 DIAGNOSIS — E785 Hyperlipidemia, unspecified: Secondary | ICD-10-CM

## 2022-01-18 DIAGNOSIS — N1831 Chronic kidney disease, stage 3a: Secondary | ICD-10-CM

## 2022-01-18 DIAGNOSIS — I10 Essential (primary) hypertension: Secondary | ICD-10-CM

## 2022-01-18 DIAGNOSIS — E1142 Type 2 diabetes mellitus with diabetic polyneuropathy: Secondary | ICD-10-CM

## 2022-01-18 DIAGNOSIS — T8744 Infection of amputation stump, left lower extremity: Secondary | ICD-10-CM | POA: Diagnosis not present

## 2022-01-18 DIAGNOSIS — Z4781 Encounter for orthopedic aftercare following surgical amputation: Secondary | ICD-10-CM | POA: Diagnosis not present

## 2022-01-18 DIAGNOSIS — F3112 Bipolar disorder, current episode manic without psychotic features, moderate: Secondary | ICD-10-CM

## 2022-01-18 DIAGNOSIS — I872 Venous insufficiency (chronic) (peripheral): Secondary | ICD-10-CM | POA: Diagnosis not present

## 2022-01-18 DIAGNOSIS — Z89511 Acquired absence of right leg below knee: Secondary | ICD-10-CM

## 2022-01-18 DIAGNOSIS — J9611 Chronic respiratory failure with hypoxia: Secondary | ICD-10-CM

## 2022-01-18 NOTE — Assessment & Plan Note (Signed)
Controlled per patient.  Continue Pristiq 100 mg daily, buspirone 30 mg twice daily, citalopram 20 mg daily, alprazolam 1 mg 4 times daily.  Following with psychiatry.

## 2022-01-18 NOTE — Assessment & Plan Note (Signed)
She is taking topiramate incorrectly.  Discussed the correct instructions which were written on her prescription bottle.  She will increase topiramate to 50 mg twice daily.

## 2022-01-18 NOTE — Assessment & Plan Note (Signed)
Reviewed recent hospital notes from May and June 2023. Reviewed vascular surgery notes from June 2023 and infectious disease notes from July 2023.  Continue omadacycline once received.

## 2022-01-18 NOTE — Progress Notes (Signed)
Patient ID: [name], female   DOB: [birth year], 51 y.o.   MRN: [id number]     Patient ID: [name], female    DOB: Nov 16, 1970, 51 y.o.   MRN: [id number]  Virtual visit completed through [employer], a video enabled telemedicine application. Due to national recommendations of social distancing due to COVID-19, a virtual visit is felt to be most appropriate for this patient at this time. Reviewed limitations, risks, security and privacy concerns of performing a virtual visit and the availability of in person appointments. I also reviewed that there may be a patient responsible charge related to this service. The patient agreed to proceed.   Patient location: home Provider location: [place] [facility], office Persons participating in this virtual visit: patient, provider, patient's son  If any vitals were documented, they were collected by patient at home unless specified below.    LMP  (LMP Unknown)    CC: Follow Up Subjective:   HPI: Evangelia Whitaker is a 51 y.o. 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  She underwent left BKA on 10/11/2021. She experienced complications of poor wound healing post operatively to the left stump, underwent surgical debridement on 11/04/2021.  She underwent right BKA on 11/15/2021 for right heel gangrene secondary to PVD and diabetes.  She experienced complications of poor wound healing to the right stump postoperatively for which she continues to experience today.  Following with vascular surgery and infectious disease.  Evaluated by vascular surgery on 01/03/2022, right wound  VAC in place.  Evaluated by infectious disease on 01/13/2022, plan is to proceed with 2 additional weeks of omadacycline.  Today she endorses compliance to her right-sided wound VAC.  She has yet to receive her prescription for omadacycline which was mailed to her last week.  She was working with home health PT and OT. She is requesting a renewal of both physical and occupational therapy. She needs assistance getting in and out of bed, building up strength to her bilateral upper and lower extremities, and general guidance on ADLs.  She is also needing extra-large gloves for her husband and bed pads.  She has an appointment with vascular surgery scheduled for next week.  2) Type 2 Diabetes:  Following with endocrinology, no office visit since October 2022. She was told that her endocrinologist was retiring and that she would seeing another provider in the office.  She was told that she would be contacted regarding an appointment and has yet to hear back.  She has not been injecting either Lantus or NovoLog as "my sugar levels haven't been high". She's checking her glucose levels every other day and is getting readings of high 100's to mid 200's . She endorses an improved diet, increased vegetable and fruit consumption.   Her last A1c on file is from March 2023 which was 10.1.  3) History of Breast Cancer: Following with oncology. Managed anastrozole 1 mg, however, she's not received refills of this medication as she's due for an office visit. She was told that she cannot come in for an office visit until the wound VAC to her right stump was removed.  4) Migraines: Currently managed on Topamax 50 mg BID. She's been taking her  Topamax 50 mg at bedtime which helps significantly with evening headaches, but she experiences headaches during the day.  She is not sure why she has not been taking Topamax twice daily.  5) COPD/Chronic Respiratory Failure: Currently managed on Symbicort 80-4.5, 2 puffs twice  daily and albuterol inhaler as needed.  She denies recent use of her albuterol inhaler.  No recent pulmonology visit.  6) CHF/CAD/Hypertension/Carotid Stenosis: Following with cardiology, Dr. Corky Sox through [facility].  Last office visit was in December 2022.  Currently managed on spironolactone 50 mg daily, rosuvastatin 10 mg daily.  She denies chest pain, increased shortness of breath, abdominal swelling.  7) Bipolar Disorder: Currently following with psychiatry and is managed on alprazolam 1 mg 4 times daily as needed, buspirone 30 mg twice daily, citalopram 20 mg daily, Pristiq 100 mg daily.  Overall she feels well managed.     Relevant past medical, surgical, family and social history reviewed and updated as indicated. Interim medical history since our last visit reviewed. Allergies and medications reviewed and updated. Outpatient Medications Prior to Visit  Medication Sig Dispense Refill   acetaminophen (TYLENOL) 325 MG tablet Take 2 tablets (650 mg total) by mouth every 4 (four) hours as needed for headache or mild pain.     albuterol (VENTOLIN HFA) 108 (90 Base) MCG/ACT inhaler Inhale 2 puffs into the lungs every 6 (six) hours as needed for wheezing or shortness of breath.     ALPRAZolam (XANAX) 1 MG tablet Take 1 tablet (1 mg total) by mouth 4 (four) times daily as needed for anxiety. 5 tablet 0   anastrozole (ARIMIDEX) 1 MG tablet Take 1 tablet (1 mg total) by mouth daily. 30 tablet 3   aspirin EC 81 MG tablet Take 81 mg by mouth daily before breakfast.     budesonide-formoterol (SYMBICORT) 80-4.5 MCG/ACT inhaler INHALE 2 PUFFS BY MOUTH EVERY 12 HOURS TO PREVENT COUGH OR WHEEZING *RINSE MOUTH AFTER EACH USE* 10.2 g 2   busPIRone (BUSPAR) 30 MG tablet Take 30 mg by mouth 2 (two) times daily.     citalopram (CELEXA) 20 MG tablet Take 1 tablet (20 mg total) by mouth daily. 30 tablet 2   desvenlafaxine (PRISTIQ) 100 MG 24 hr tablet Take 100 mg by mouth daily.     EYLEA 2 MG/0.05ML  SOSY      insulin aspart (NOVOLOG) 100 UNIT/ML FlexPen Inject 1-9 Units into the skin 4 (four) times daily -  before meals and at bedtime. Glucose 121 - 150: 1 unit, Glucose 151 - 200: 2 units, Glucose 201 - 250: 3 units, Glucose 251 - 300: 5 units, Glucose 301 - 350: 7 units, Glucose 351 - 400: 9 units, Glucose > 400 call MD 15 mL 11   insulin glargine (LANTUS) 100 UNIT/ML Solostar Pen Inject 35 Units into the skin daily. 15 mL 11   naloxone (NARCAN) nasal spray 4 mg/0.1 mL SMARTSIG:Spray(s) In Nostril     Omadacycline Tosylate (NUZYRA) 150 MG TABS Take 3 tablets by mouth on days 1 and 2, then take 2 tablets by mouth daily thereafter. Take on an empty stomach. 30 tablet 0   ondansetron (ZOFRAN) 8 MG tablet One pill every 8 hours as needed for nausea/vomitting. 40 tablet 1   oxyCODONE (OXY IR/ROXICODONE) 5 MG immediate release tablet Take 1 tablet (5 mg total) by mouth every 6 (six) hours as needed for severe pain. 30 tablet 0   OXYGEN Inhale 2-4 L into the lungs 2 (two) times daily as  needed (shortness of breath).     pregabalin (LYRICA) 300 MG capsule TAKE 1 CAPSULE BY MOUTH TWICE DAILY FOR  NERVE  PAIN 180 capsule 0   rOPINIRole (REQUIP) 1 MG tablet TAKE 1 TABLET BY MOUTH ONCE DAILY AT BEDTIME FOR  RESTLESS  LEG 90 tablet 0   rosuvastatin (CRESTOR) 10 MG tablet Take 1 tablet (10 mg total) by mouth at bedtime. 30 tablet 0   spironolactone (ALDACTONE) 50 MG tablet Take 50 mg by mouth daily.     topiramate (TOPAMAX) 50 MG tablet TAKE 1 TABLET BY MOUTH TWICE DAILY FOR HEADACHE PREVENTION 180 tablet 0   traZODone (DESYREL) 100 MG tablet Take 200 mg by mouth at bedtime.     No facility-administered medications prior to visit.     Per HPI unless specifically indicated in ROS section below Review of Systems  Constitutional:  Negative for fever.  Respiratory:  Negative for shortness of breath.   Cardiovascular:  Negative for chest pain.  Gastrointestinal:  Negative for abdominal pain.   Neurological:  Positive for headaches. Negative for dizziness.  Psychiatric/Behavioral:  The patient is not nervous/anxious.    Objective:  LMP  (LMP Unknown)   Wt Readings from Last 3 Encounters:  12/12/21 220 lb 7.4 oz (100 kg)  12/10/21 238 lb 1.6 oz (108 kg)  11/20/21 229 lb 15 oz (104.3 kg)       Physical exam: General: Alert and oriented x 3, no distress, does not appear sickly  Pulmonary: Speaks in complete sentences without increased work of breathing, no cough during visit.  Psychiatric: Normal mood, thought content, and behavior.     Results for orders placed or performed during the hospital encounter of 01/13/22  Aerobic Culture w Gram Stain (superficial specimen)   Specimen: Wound  Result Value Ref Range   Specimen Description      WOUND Performed at [facility], [address] [place], [place] [address]    Special Requests      NONE Performed at [facility], [place], [place] [address]    Gram Stain      RARE WBC PRESENT, PREDOMINANTLY PMN FEW GRAM POSITIVE COCCI IN PAIRS Performed at [facility], [address] [place], [place] [address]    Culture      FEW METHICILLIN RESISTANT STAPHYLOCOCCUS AUREUS ABUNDANT PSEUDOMONAS AERUGINOSA    Report Status 01/16/2022 FINAL    Organism ID, Bacteria METHICILLIN RESISTANT STAPHYLOCOCCUS AUREUS    Organism ID, Bacteria PSEUDOMONAS AERUGINOSA       Susceptibility   Methicillin resistant staphylococcus aureus - MIC*    CIPROFLOXACIN >=8 RESISTANT Resistant     ERYTHROMYCIN >=8 RESISTANT Resistant     GENTAMICIN >=16 RESISTANT Resistant     OXACILLIN >=4 RESISTANT Resistant     TETRACYCLINE >=16 RESISTANT Resistant     VANCOMYCIN 1 SENSITIVE Sensitive     TRIMETH/SULFA >=320 RESISTANT Resistant     CLINDAMYCIN >=8 RESISTANT Resistant     RIFAMPIN <=0.5 SENSITIVE Sensitive     Inducible Clindamycin NEGATIVE Sensitive     * FEW METHICILLIN RESISTANT STAPHYLOCOCCUS  AUREUS   Pseudomonas aeruginosa - MIC*    CEFTAZIDIME 32 RESISTANT Resistant     CIPROFLOXACIN 0.5 SENSITIVE Sensitive     GENTAMICIN <=1 SENSITIVE Sensitive     IMIPENEM <=0.25 SENSITIVE Sensitive     * ABUNDANT PSEUDOMONAS AERUGINOSA  Comprehensive metabolic panel  Result Value Ref Range   Sodium 136 135 - 145 mmol/L  Potassium 4.4 3.5 - 5.1 mmol/L   Chloride 106 98 - 111 mmol/L   CO2 26 22 - 32 mmol/L   Glucose, Bld 243 (H) 70 - 99 mg/dL   BUN 23 (H) 6 - 20 mg/dL   Creatinine, Ser 0.71 0.44 - 1.00 mg/dL   Calcium 9.0 8.9 - 10.3 mg/dL   Total Protein 8.8 (H) 6.5 - 8.1 g/dL   Albumin 2.9 (L) 3.5 - 5.0 g/dL   AST 12 (L) 15 - 41 U/L   ALT 10 0 - 44 U/L   Alkaline Phosphatase 127 (H) 38 - 126 U/L   Total Bilirubin 0.9 0.3 - 1.2 mg/dL   GFR, Estimated >60 >60 mL/min   Anion gap 4 (L) 5 - 15  CBC with Differential/Platelet  Result Value Ref Range   WBC 5.3 4.0 - 10.5 K/uL   RBC 3.94 3.87 - 5.11 MIL/uL   Hemoglobin 9.3 (L) 12.0 - 15.0 g/dL   HCT 30.7 (L) 36.0 - 46.0 %   MCV 77.9 (L) 80.0 - 100.0 fL   MCH 23.6 (L) 26.0 - 34.0 pg   MCHC 30.3 30.0 - 36.0 g/dL   RDW 17.1 (H) 11.5 - 15.5 %   Platelets 183 150 - 400 K/uL   nRBC 0.0 0.0 - 0.2 %   Neutrophils Relative % 71 %   Neutro Abs 3.8 1.7 - 7.7 K/uL   Lymphocytes Relative 16 %   Lymphs Abs 0.9 0.7 - 4.0 K/uL   Monocytes Relative 6 %   Monocytes Absolute 0.3 0.1 - 1.0 K/uL   Eosinophils Relative 5 %   Eosinophils Absolute 0.2 0.0 - 0.5 K/uL   Basophils Relative 1 %   Basophils Absolute 0.1 0.0 - 0.1 K/uL   Immature Granulocytes 1 %   Abs Immature Granulocytes 0.03 0.00 - 0.07 K/uL   *Note: Due to a large number of results and/or encounters for the requested time period, some results have not been displayed. A complete set of results can be found in Results Review.   Assessment & Plan:   Problem List Items Addressed This Visit       Cardiovascular and Mediastinum   Chronic congestive heart failure ([place])    Appears  euvolemic today. Follow with cardiology, office notes from December 2022 reviewed through care everywhere. Echocardiogram reviewed from May 2023. No longer on torsemide.  Continue spironolactone 50 mg daily.       Chronic migraine without aura without status migrainosus, not intractable    She is taking topiramate incorrectly.  Discussed the correct instructions which were written on her prescription bottle.  She will increase topiramate to 50 mg twice daily.      HTN (hypertension)    Controlled.  Continue spironolactone 50 mg daily. Reviewed CMP from July 2023.      CAD (coronary artery disease)    Asymptomatic. Following with cardiology, office notes reviewed from December 2022 through care everywhere.  Continue rosuvastatin 10 mg daily, aspirin 81 mg daily.        Respiratory   Chronic respiratory failure with hypoxia (HCC)/ nocturnal 02 dep     Following with pulmonology.  Continue Symbicort 80-4.5, 2 puffs twice daily. Continue albuterol inhaler as needed.  Continue evening oxygen.        Endocrine   Uncontrolled type 2 diabetes mellitus with hyperglycemia, with long-term current use of insulin ([place])    Uncontrolled with A1c of 10.1 from March 2023.  Unclear why she is not injecting either  her Lantus or NovoLog insulins. Discussed to start checking glucose readings at least twice daily. Strongly advised that she contact her endocrinology office to get an appointment set up with her new endocrinologist.  I also advised her to come by our office for an A1c check.      Relevant Orders   Hemoglobin A1c   Diabetic polyneuropathy associated with type 2 diabetes mellitus ([place])    Controlled.  Continue Lyrica 300 mg twice daily.      Hyperlipidemia associated with type 2 diabetes mellitus (HCC)    Repeat lipid panel pending. Continue Crestor 10 mg daily.      Relevant Orders   Lipid panel     Genitourinary   Chronic kidney disease, stage 3a ([place])     Overall appears stable. Reviewed recent CMP from July 2023.        Other   Bipolar disorder ([place])    Controlled per patient.  Continue Pristiq 100 mg daily, buspirone 30 mg twice daily, citalopram 20 mg daily, alprazolam 1 mg 4 times daily.  Following with psychiatry.      Restless leg syndrome    Controlled per patient.  Continue Requip 1 mg at bedtime.      History of breast cancer    Continue anastrozole 1 mg daily when able. I did request that she follow-up with her oncologist.      Hx of BKA, right [facility])    Reviewed recent hospital notes from May and June 2023. Reviewed vascular surgery notes from June 2023 and infectious disease notes from July 2023.  Continue omadacycline once received.         No orders of the defined types were placed in this encounter.  Orders Placed This Encounter  Procedures   Lipid panel    Standing Status:   Future    Standing Expiration Date:   01/19/2023   Hemoglobin A1c    Standing Status:   Future    Standing Expiration Date:   01/19/2023    I discussed the assessment and treatment plan with the patient. The patient was provided an opportunity to ask questions and all were answered. The patient agreed with the plan and demonstrated an understanding of the instructions. The patient was advised to call back or seek an in-person evaluation if the symptoms worsen or if the condition fails to improve as anticipated.  Follow up plan:  Please schedule a lab only appointment to come for your diabetes and cholesterol check.  Please follow-up with your oncologist and your other providers as discussed.  Please call your endocrinologist office to schedule an appointment with a new endocrinologist.  It was a pleasure to see you today!   Pleas Koch, NP

## 2022-01-18 NOTE — Assessment & Plan Note (Signed)
Repeat lipid panel pending. Continue Crestor 10 mg daily.

## 2022-01-18 NOTE — Assessment & Plan Note (Signed)
Controlled.  Continue spironolactone 50 mg daily. Reviewed CMP from July 2023.

## 2022-01-18 NOTE — Assessment & Plan Note (Signed)
Uncontrolled with A1c of 10.1 from March 2023.  Unclear why she is not injecting either her Lantus or NovoLog insulins. Discussed to start checking glucose readings at least twice daily. Strongly advised that she contact her endocrinology office to get an appointment set up with her new endocrinologist.  I also advised her to come by our office for an A1c check.

## 2022-01-18 NOTE — Assessment & Plan Note (Signed)
Controlled per patient.  Continue Requip 1 mg at bedtime.

## 2022-01-18 NOTE — Assessment & Plan Note (Signed)
Controlled.  Continue Lyrica 300 mg twice daily.

## 2022-01-18 NOTE — Assessment & Plan Note (Signed)
Asymptomatic. Following with cardiology, office notes reviewed from December 2022 through care everywhere.  Continue rosuvastatin 10 mg daily, aspirin 81 mg daily.

## 2022-01-18 NOTE — Assessment & Plan Note (Signed)
Following with pulmonology.  Continue Symbicort 80-4.5, 2 puffs twice daily. Continue albuterol inhaler as needed.  Continue evening oxygen.

## 2022-01-18 NOTE — Patient Instructions (Signed)
Please schedule a lab only appointment to come for your diabetes and cholesterol check.  Please follow-up with your oncologist and your other providers as discussed.  Please call your endocrinologist office to schedule an appointment with a new endocrinologist.  It was a pleasure to see you today!

## 2022-01-18 NOTE — Assessment & Plan Note (Signed)
Continue anastrozole 1 mg daily when able. I did request that she follow-up with her oncologist.

## 2022-01-18 NOTE — Assessment & Plan Note (Signed)
Appears euvolemic today. Follow with cardiology, office notes from December 2022 reviewed through care everywhere. Echocardiogram reviewed from May 2023. No longer on torsemide.  Continue spironolactone 50 mg daily.

## 2022-01-18 NOTE — Assessment & Plan Note (Addendum)
Overall appears stable. Reviewed recent CMP from July 2023.

## 2022-01-19 ENCOUNTER — Other Ambulatory Visit (HOSPITAL_COMMUNITY): Payer: Self-pay

## 2022-01-19 ENCOUNTER — Other Ambulatory Visit: Payer: Self-pay | Admitting: Infectious Diseases

## 2022-01-19 ENCOUNTER — Telehealth: Payer: Self-pay

## 2022-01-19 ENCOUNTER — Telehealth (INDEPENDENT_AMBULATORY_CARE_PROVIDER_SITE_OTHER): Payer: Self-pay

## 2022-01-19 ENCOUNTER — Telehealth (INDEPENDENT_AMBULATORY_CARE_PROVIDER_SITE_OTHER): Payer: Self-pay | Admitting: Vascular Surgery

## 2022-01-19 MED ORDER — CIPROFLOXACIN HCL 500 MG PO TABS
500.0000 mg | ORAL_TABLET | Freq: Two times a day (BID) | ORAL | 0 refills | Status: DC
  Filled 2022-01-19: qty 60, 30d supply, fill #0

## 2022-01-19 NOTE — Progress Notes (Signed)
Ciprofloxacin sent to [facility] with request to mail it to her house

## 2022-01-19 NOTE — Telephone Encounter (Signed)
Meagan from [facility] home health left a message requesting verbal orders to apply alginate with silver three times a week with vac changes for new area on stump and change nurses visits to M,W,F per patient request. Carron Brazen was made aware that verbal orders are fine per Eulogio Ditch NP.

## 2022-01-19 NOTE — Telephone Encounter (Signed)
-----   Message from Tsosie Billing, MD sent at 01/18/2022  4:35 PM EDT ----- Can you check to see whether she got omadacycline from [facility]- Need to send her a prescrition for ciprofloxacin to treat the pseudomonas cultured in the left BKA stump wound- can  you ask her which pharmacy to send? Thx  ----- Message ----- From: Buel Ream, Lab In [place] Sent: 01/13/2022  11:52 AM EDT To: Tsosie Billing, MD

## 2022-01-19 NOTE — Telephone Encounter (Signed)
I spoke to the patient and she stated she did not receive the [place]. Patient aware of the results from the culture as well and that she will need to start ciprofloxacin as well.   I spoke to [facility] and they stated they were not aware to mail out the prescription. They will mail it out to the patient now. Ciprofloxacin rx can also be sent there as well just put in the comments for the rx to be mailed.  Tonni Mansour T Brooks Sailors

## 2022-01-20 NOTE — Telephone Encounter (Signed)
Pharmacy changed to [facility] and it will be shipped to the patient

## 2022-01-21 DIAGNOSIS — N1831 Chronic kidney disease, stage 3a: Secondary | ICD-10-CM | POA: Diagnosis not present

## 2022-01-21 DIAGNOSIS — E1169 Type 2 diabetes mellitus with other specified complication: Secondary | ICD-10-CM | POA: Diagnosis not present

## 2022-01-21 DIAGNOSIS — F319 Bipolar disorder, unspecified: Secondary | ICD-10-CM | POA: Diagnosis not present

## 2022-01-21 DIAGNOSIS — E1122 Type 2 diabetes mellitus with diabetic chronic kidney disease: Secondary | ICD-10-CM | POA: Diagnosis not present

## 2022-01-21 DIAGNOSIS — E782 Mixed hyperlipidemia: Secondary | ICD-10-CM | POA: Diagnosis not present

## 2022-01-21 DIAGNOSIS — T8744 Infection of amputation stump, left lower extremity: Secondary | ICD-10-CM | POA: Diagnosis not present

## 2022-01-21 DIAGNOSIS — I5032 Chronic diastolic (congestive) heart failure: Secondary | ICD-10-CM | POA: Diagnosis not present

## 2022-01-21 DIAGNOSIS — Z4781 Encounter for orthopedic aftercare following surgical amputation: Secondary | ICD-10-CM | POA: Diagnosis not present

## 2022-01-21 DIAGNOSIS — J449 Chronic obstructive pulmonary disease, unspecified: Secondary | ICD-10-CM | POA: Diagnosis not present

## 2022-01-21 DIAGNOSIS — F419 Anxiety disorder, unspecified: Secondary | ICD-10-CM | POA: Diagnosis not present

## 2022-01-21 DIAGNOSIS — I872 Venous insufficiency (chronic) (peripheral): Secondary | ICD-10-CM | POA: Diagnosis not present

## 2022-01-21 DIAGNOSIS — E1151 Type 2 diabetes mellitus with diabetic peripheral angiopathy without gangrene: Secondary | ICD-10-CM | POA: Diagnosis not present

## 2022-01-21 DIAGNOSIS — I130 Hypertensive heart and chronic kidney disease with heart failure and stage 1 through stage 4 chronic kidney disease, or unspecified chronic kidney disease: Secondary | ICD-10-CM | POA: Diagnosis not present

## 2022-01-24 DIAGNOSIS — J449 Chronic obstructive pulmonary disease, unspecified: Secondary | ICD-10-CM | POA: Diagnosis not present

## 2022-01-24 DIAGNOSIS — T8744 Infection of amputation stump, left lower extremity: Secondary | ICD-10-CM | POA: Diagnosis not present

## 2022-01-24 DIAGNOSIS — N1831 Chronic kidney disease, stage 3a: Secondary | ICD-10-CM | POA: Diagnosis not present

## 2022-01-24 DIAGNOSIS — E1151 Type 2 diabetes mellitus with diabetic peripheral angiopathy without gangrene: Secondary | ICD-10-CM | POA: Diagnosis not present

## 2022-01-24 DIAGNOSIS — F319 Bipolar disorder, unspecified: Secondary | ICD-10-CM | POA: Diagnosis not present

## 2022-01-24 DIAGNOSIS — E1122 Type 2 diabetes mellitus with diabetic chronic kidney disease: Secondary | ICD-10-CM | POA: Diagnosis not present

## 2022-01-24 DIAGNOSIS — I130 Hypertensive heart and chronic kidney disease with heart failure and stage 1 through stage 4 chronic kidney disease, or unspecified chronic kidney disease: Secondary | ICD-10-CM | POA: Diagnosis not present

## 2022-01-24 DIAGNOSIS — F419 Anxiety disorder, unspecified: Secondary | ICD-10-CM | POA: Diagnosis not present

## 2022-01-24 DIAGNOSIS — I872 Venous insufficiency (chronic) (peripheral): Secondary | ICD-10-CM | POA: Diagnosis not present

## 2022-01-24 DIAGNOSIS — E782 Mixed hyperlipidemia: Secondary | ICD-10-CM | POA: Diagnosis not present

## 2022-01-24 DIAGNOSIS — Z4781 Encounter for orthopedic aftercare following surgical amputation: Secondary | ICD-10-CM | POA: Diagnosis not present

## 2022-01-24 DIAGNOSIS — E1169 Type 2 diabetes mellitus with other specified complication: Secondary | ICD-10-CM | POA: Diagnosis not present

## 2022-01-24 DIAGNOSIS — I5032 Chronic diastolic (congestive) heart failure: Secondary | ICD-10-CM | POA: Diagnosis not present

## 2022-01-25 ENCOUNTER — Ambulatory Visit (INDEPENDENT_AMBULATORY_CARE_PROVIDER_SITE_OTHER): Payer: Medicare Other | Admitting: Nurse Practitioner

## 2022-01-25 DIAGNOSIS — N1831 Chronic kidney disease, stage 3a: Secondary | ICD-10-CM | POA: Diagnosis not present

## 2022-01-25 DIAGNOSIS — I5032 Chronic diastolic (congestive) heart failure: Secondary | ICD-10-CM | POA: Diagnosis not present

## 2022-01-25 DIAGNOSIS — J449 Chronic obstructive pulmonary disease, unspecified: Secondary | ICD-10-CM | POA: Diagnosis not present

## 2022-01-25 DIAGNOSIS — T8789 Other complications of amputation stump: Secondary | ICD-10-CM | POA: Diagnosis not present

## 2022-01-25 DIAGNOSIS — Z4781 Encounter for orthopedic aftercare following surgical amputation: Secondary | ICD-10-CM | POA: Diagnosis not present

## 2022-01-25 DIAGNOSIS — E1169 Type 2 diabetes mellitus with other specified complication: Secondary | ICD-10-CM | POA: Diagnosis not present

## 2022-01-25 DIAGNOSIS — E1122 Type 2 diabetes mellitus with diabetic chronic kidney disease: Secondary | ICD-10-CM | POA: Diagnosis not present

## 2022-01-25 DIAGNOSIS — F419 Anxiety disorder, unspecified: Secondary | ICD-10-CM | POA: Diagnosis not present

## 2022-01-25 DIAGNOSIS — E782 Mixed hyperlipidemia: Secondary | ICD-10-CM | POA: Diagnosis not present

## 2022-01-25 DIAGNOSIS — E1151 Type 2 diabetes mellitus with diabetic peripheral angiopathy without gangrene: Secondary | ICD-10-CM | POA: Diagnosis not present

## 2022-01-25 DIAGNOSIS — F319 Bipolar disorder, unspecified: Secondary | ICD-10-CM | POA: Diagnosis not present

## 2022-01-25 DIAGNOSIS — T8744 Infection of amputation stump, left lower extremity: Secondary | ICD-10-CM | POA: Diagnosis not present

## 2022-01-25 DIAGNOSIS — I872 Venous insufficiency (chronic) (peripheral): Secondary | ICD-10-CM | POA: Diagnosis not present

## 2022-01-25 DIAGNOSIS — I130 Hypertensive heart and chronic kidney disease with heart failure and stage 1 through stage 4 chronic kidney disease, or unspecified chronic kidney disease: Secondary | ICD-10-CM | POA: Diagnosis not present

## 2022-01-25 NOTE — Telephone Encounter (Signed)
Addressed with patient at virtual no further action needed at this time.

## 2022-01-26 ENCOUNTER — Ambulatory Visit (INDEPENDENT_AMBULATORY_CARE_PROVIDER_SITE_OTHER): Payer: Medicare Other | Admitting: Nurse Practitioner

## 2022-01-26 ENCOUNTER — Encounter (INDEPENDENT_AMBULATORY_CARE_PROVIDER_SITE_OTHER): Payer: Self-pay | Admitting: Nurse Practitioner

## 2022-01-26 VITALS — BP 134/74 | HR 76 | Resp 16

## 2022-01-26 DIAGNOSIS — Z89512 Acquired absence of left leg below knee: Secondary | ICD-10-CM

## 2022-01-26 DIAGNOSIS — Z89511 Acquired absence of right leg below knee: Secondary | ICD-10-CM

## 2022-01-27 ENCOUNTER — Ambulatory Visit (INDEPENDENT_AMBULATORY_CARE_PROVIDER_SITE_OTHER): Payer: Medicare Other | Admitting: Nurse Practitioner

## 2022-01-27 ENCOUNTER — Telehealth: Payer: Self-pay

## 2022-01-27 NOTE — Telephone Encounter (Signed)
Patient called complaining of nausea, vomiting and abdominal pain with taking the Cipro. She states she has only taken 1 dose.  Patient tolerating the Omadacycline.  Per Dr. Delaine Lame patient can stop the Cipro.  I called and left patient a message letting her know to stop the Cipro, but continue the Omadacycle. All information left on a secured voicemail.  Jolaine Fryberger T Brooks Sailors

## 2022-01-28 ENCOUNTER — Telehealth (INDEPENDENT_AMBULATORY_CARE_PROVIDER_SITE_OTHER): Payer: Self-pay | Admitting: Nurse Practitioner

## 2022-01-28 DIAGNOSIS — F419 Anxiety disorder, unspecified: Secondary | ICD-10-CM | POA: Diagnosis not present

## 2022-01-28 DIAGNOSIS — E782 Mixed hyperlipidemia: Secondary | ICD-10-CM | POA: Diagnosis not present

## 2022-01-28 DIAGNOSIS — E1169 Type 2 diabetes mellitus with other specified complication: Secondary | ICD-10-CM | POA: Diagnosis not present

## 2022-01-28 DIAGNOSIS — T8744 Infection of amputation stump, left lower extremity: Secondary | ICD-10-CM | POA: Diagnosis not present

## 2022-01-28 DIAGNOSIS — E1122 Type 2 diabetes mellitus with diabetic chronic kidney disease: Secondary | ICD-10-CM | POA: Diagnosis not present

## 2022-01-28 DIAGNOSIS — J449 Chronic obstructive pulmonary disease, unspecified: Secondary | ICD-10-CM | POA: Diagnosis not present

## 2022-01-28 DIAGNOSIS — I130 Hypertensive heart and chronic kidney disease with heart failure and stage 1 through stage 4 chronic kidney disease, or unspecified chronic kidney disease: Secondary | ICD-10-CM | POA: Diagnosis not present

## 2022-01-28 DIAGNOSIS — N1831 Chronic kidney disease, stage 3a: Secondary | ICD-10-CM | POA: Diagnosis not present

## 2022-01-28 DIAGNOSIS — E1151 Type 2 diabetes mellitus with diabetic peripheral angiopathy without gangrene: Secondary | ICD-10-CM | POA: Diagnosis not present

## 2022-01-28 DIAGNOSIS — Z4781 Encounter for orthopedic aftercare following surgical amputation: Secondary | ICD-10-CM | POA: Diagnosis not present

## 2022-01-28 DIAGNOSIS — I5032 Chronic diastolic (congestive) heart failure: Secondary | ICD-10-CM | POA: Diagnosis not present

## 2022-01-28 DIAGNOSIS — I872 Venous insufficiency (chronic) (peripheral): Secondary | ICD-10-CM | POA: Diagnosis not present

## 2022-01-28 DIAGNOSIS — F319 Bipolar disorder, unspecified: Secondary | ICD-10-CM | POA: Diagnosis not present

## 2022-01-28 MED ORDER — OXYCODONE HCL 5 MG PO TABS: 5.0000 mg | ORAL_TABLET | Freq: Four times a day (QID) | ORAL | 0 refills | Status: DC | PRN

## 2022-01-28 NOTE — Telephone Encounter (Signed)
Patient called about some medication that [facility] was supposed to send in for her and she never received.  Please advice.

## 2022-01-28 NOTE — Telephone Encounter (Signed)
This encounter was created in error - please disregard.

## 2022-01-28 NOTE — Telephone Encounter (Signed)
Spoke with pt to let her know that her medication was sent to pharmacy. She stated verbal understanding

## 2022-01-28 NOTE — Telephone Encounter (Signed)
Patient called about her medication that fb was sending in to the pharmacy. She states she never goy the medication.  Please advise. (Hydrocodone?)

## 2022-01-28 NOTE — Telephone Encounter (Signed)
It is sent.

## 2022-01-31 ENCOUNTER — Telehealth (INDEPENDENT_AMBULATORY_CARE_PROVIDER_SITE_OTHER): Payer: Self-pay

## 2022-01-31 DIAGNOSIS — Z4781 Encounter for orthopedic aftercare following surgical amputation: Secondary | ICD-10-CM | POA: Diagnosis not present

## 2022-01-31 DIAGNOSIS — E782 Mixed hyperlipidemia: Secondary | ICD-10-CM | POA: Diagnosis not present

## 2022-01-31 DIAGNOSIS — E1169 Type 2 diabetes mellitus with other specified complication: Secondary | ICD-10-CM | POA: Diagnosis not present

## 2022-01-31 DIAGNOSIS — F319 Bipolar disorder, unspecified: Secondary | ICD-10-CM | POA: Diagnosis not present

## 2022-01-31 DIAGNOSIS — T8744 Infection of amputation stump, left lower extremity: Secondary | ICD-10-CM | POA: Diagnosis not present

## 2022-01-31 DIAGNOSIS — E1151 Type 2 diabetes mellitus with diabetic peripheral angiopathy without gangrene: Secondary | ICD-10-CM | POA: Diagnosis not present

## 2022-01-31 DIAGNOSIS — I872 Venous insufficiency (chronic) (peripheral): Secondary | ICD-10-CM | POA: Diagnosis not present

## 2022-01-31 DIAGNOSIS — F419 Anxiety disorder, unspecified: Secondary | ICD-10-CM | POA: Diagnosis not present

## 2022-01-31 DIAGNOSIS — J449 Chronic obstructive pulmonary disease, unspecified: Secondary | ICD-10-CM | POA: Diagnosis not present

## 2022-01-31 DIAGNOSIS — E1122 Type 2 diabetes mellitus with diabetic chronic kidney disease: Secondary | ICD-10-CM | POA: Diagnosis not present

## 2022-01-31 DIAGNOSIS — I130 Hypertensive heart and chronic kidney disease with heart failure and stage 1 through stage 4 chronic kidney disease, or unspecified chronic kidney disease: Secondary | ICD-10-CM | POA: Diagnosis not present

## 2022-01-31 DIAGNOSIS — N1831 Chronic kidney disease, stage 3a: Secondary | ICD-10-CM | POA: Diagnosis not present

## 2022-01-31 DIAGNOSIS — I5032 Chronic diastolic (congestive) heart failure: Secondary | ICD-10-CM | POA: Diagnosis not present

## 2022-01-31 NOTE — Telephone Encounter (Signed)
Aquacell on large wound.. wet to dry on smaller wound

## 2022-01-31 NOTE — Telephone Encounter (Signed)
[name] has been made aware with verbal orders

## 2022-02-01 DIAGNOSIS — E1122 Type 2 diabetes mellitus with diabetic chronic kidney disease: Secondary | ICD-10-CM | POA: Diagnosis not present

## 2022-02-01 DIAGNOSIS — J449 Chronic obstructive pulmonary disease, unspecified: Secondary | ICD-10-CM | POA: Diagnosis not present

## 2022-02-01 DIAGNOSIS — E1169 Type 2 diabetes mellitus with other specified complication: Secondary | ICD-10-CM | POA: Diagnosis not present

## 2022-02-01 DIAGNOSIS — I872 Venous insufficiency (chronic) (peripheral): Secondary | ICD-10-CM | POA: Diagnosis not present

## 2022-02-01 DIAGNOSIS — N1831 Chronic kidney disease, stage 3a: Secondary | ICD-10-CM | POA: Diagnosis not present

## 2022-02-01 DIAGNOSIS — T8744 Infection of amputation stump, left lower extremity: Secondary | ICD-10-CM | POA: Diagnosis not present

## 2022-02-01 DIAGNOSIS — I5032 Chronic diastolic (congestive) heart failure: Secondary | ICD-10-CM | POA: Diagnosis not present

## 2022-02-01 DIAGNOSIS — Z4781 Encounter for orthopedic aftercare following surgical amputation: Secondary | ICD-10-CM | POA: Diagnosis not present

## 2022-02-01 DIAGNOSIS — I130 Hypertensive heart and chronic kidney disease with heart failure and stage 1 through stage 4 chronic kidney disease, or unspecified chronic kidney disease: Secondary | ICD-10-CM | POA: Diagnosis not present

## 2022-02-01 DIAGNOSIS — E1151 Type 2 diabetes mellitus with diabetic peripheral angiopathy without gangrene: Secondary | ICD-10-CM | POA: Diagnosis not present

## 2022-02-01 DIAGNOSIS — E782 Mixed hyperlipidemia: Secondary | ICD-10-CM | POA: Diagnosis not present

## 2022-02-01 DIAGNOSIS — F419 Anxiety disorder, unspecified: Secondary | ICD-10-CM | POA: Diagnosis not present

## 2022-02-01 DIAGNOSIS — F319 Bipolar disorder, unspecified: Secondary | ICD-10-CM | POA: Diagnosis not present

## 2022-02-03 ENCOUNTER — Ambulatory Visit: Payer: BC Managed Care – PPO | Attending: Infectious Diseases | Admitting: Infectious Diseases

## 2022-02-03 ENCOUNTER — Telehealth: Payer: Self-pay | Admitting: Internal Medicine

## 2022-02-03 ENCOUNTER — Other Ambulatory Visit: Payer: Self-pay

## 2022-02-03 ENCOUNTER — Telehealth: Payer: Self-pay

## 2022-02-03 ENCOUNTER — Other Ambulatory Visit: Payer: Self-pay | Admitting: Nurse Practitioner

## 2022-02-03 DIAGNOSIS — T8744 Infection of amputation stump, left lower extremity: Secondary | ICD-10-CM | POA: Diagnosis not present

## 2022-02-03 DIAGNOSIS — I96 Gangrene, not elsewhere classified: Secondary | ICD-10-CM | POA: Insufficient documentation

## 2022-02-03 DIAGNOSIS — E11628 Type 2 diabetes mellitus with other skin complications: Secondary | ICD-10-CM | POA: Diagnosis not present

## 2022-02-03 DIAGNOSIS — Y849 Medical procedure, unspecified as the cause of abnormal reaction of the patient, or of later complication, without mention of misadventure at the time of the procedure: Secondary | ICD-10-CM | POA: Diagnosis not present

## 2022-02-03 DIAGNOSIS — B9562 Methicillin resistant Staphylococcus aureus infection as the cause of diseases classified elsewhere: Secondary | ICD-10-CM

## 2022-02-03 DIAGNOSIS — E1152 Type 2 diabetes mellitus with diabetic peripheral angiopathy with gangrene: Secondary | ICD-10-CM | POA: Diagnosis not present

## 2022-02-03 DIAGNOSIS — T8740 Infection of amputation stump, unspecified extremity: Secondary | ICD-10-CM

## 2022-02-03 DIAGNOSIS — A4902 Methicillin resistant Staphylococcus aureus infection, unspecified site: Secondary | ICD-10-CM | POA: Diagnosis not present

## 2022-02-03 MED ORDER — ANASTROZOLE 1 MG PO TABS: 1.0000 mg | ORAL_TABLET | Freq: Every day | ORAL | 3 refills | Status: DC

## 2022-02-03 MED ORDER — ANASTROZOLE 1 MG PO TABS: 1.0000 mg | ORAL_TABLET | Freq: Every day | ORAL | 0 refills | Status: DC

## 2022-02-03 NOTE — Telephone Encounter (Signed)
Patient stated she need a refill on this medication. Dr.b patient.

## 2022-02-03 NOTE — Progress Notes (Signed)
The purpose of this virtual visit is to provide medical care while limiting exposure to the novel coronavirus (COVID19) for both patient and office staff.   Consent was obtained for video visit :  Yes.   Answered questions that patient had about telehealth interaction:  Yes.   I discussed the limitations, risks, security and privacy concerns of performing an evaluation and management service by telephone. I also discussed with the patient that there may be a patient responsible charge related to this service. The patient expressed understanding and agreed to proceed.   Patient Location: Home Provider Location: office Pt was last seen in my office on 01/13/22 [name] is a 51 yr female with h/o DM, rt breast ca s/p mastectomy , HTN, nicotine dependence, COPD, CVA, recurrent treated MRSA skin infecitons B/l BKA due to Diabetic foot infection  left BKA stump infection  - June 2023 MRSA and proteus MRSA and proteus and was treated with omadacycline for 26 days She had wound vac  I saw her on 01/13/22 and the wound had not closed completely- most of it was very clean-=    There was a scabbed area and a repeat culture was sent from it   A repeat culture grew pseudomonas and MRSA She was restarted on omadacycline and cipro- She could not tolerate the cipro due to GI symtpms ( vomiting) and she stopped it Today she says she does not have wound vac no more The wound is better but not closed completely She has a follow up appt with vascular 1st week of august. She has completed omadacycline  I could not see the wound as she said , the nurse comes every 3-4 days to change the dressing She has no fever or chills Blood sugar is not well controlled  Impression/recommendation Diabetic foot infection B/l BKA - rt side has healed completely Left BKA stump infection with MRSA and was treated IT has not closed completely Recent culture from a different area was pseudomonas and MRSA- she did not  tolerate  cipro.  Completed 5 weeks of omadacycline No further antibiotics now Will discuss with vascular- once they evaluate her we can decide on further management. Discussed the management with aptient Total time spent - 13 min

## 2022-02-03 NOTE — Telephone Encounter (Signed)
Call patient informed her she is past due for appointment. Patient stated she was in the hospital for bilateral amputations. She states one leg is better and the other is a dressed wound/ but healing. She verbalized she could make wed 8/2 at [place]. Scheduler informed

## 2022-02-03 NOTE — Telephone Encounter (Signed)
Called to inform patient lab/ MD/injection apt scheduled wed 8/2 per request, no answer left message

## 2022-02-03 NOTE — Telephone Encounter (Signed)
Per Chat:  Lauren asks me to call patient to get her scheduled since she is past due she has 2 wounds but she verbalized to me she would be ok to come on Wed 2nd at 9am. Can you schedule lab 9a/ see lauren or Dr B / eliguard . Dr B last note  follow up in 3 month- MD; labs- cbc/cmp/ca-27-29;estrogen; LH; [place]-  [facility]-Dr.B. Thanks  Appointments scheduled: [facility]

## 2022-02-04 DIAGNOSIS — F319 Bipolar disorder, unspecified: Secondary | ICD-10-CM | POA: Diagnosis not present

## 2022-02-04 DIAGNOSIS — E1122 Type 2 diabetes mellitus with diabetic chronic kidney disease: Secondary | ICD-10-CM | POA: Diagnosis not present

## 2022-02-04 DIAGNOSIS — I872 Venous insufficiency (chronic) (peripheral): Secondary | ICD-10-CM | POA: Diagnosis not present

## 2022-02-04 DIAGNOSIS — Z4781 Encounter for orthopedic aftercare following surgical amputation: Secondary | ICD-10-CM | POA: Diagnosis not present

## 2022-02-04 DIAGNOSIS — T8744 Infection of amputation stump, left lower extremity: Secondary | ICD-10-CM | POA: Diagnosis not present

## 2022-02-04 DIAGNOSIS — E782 Mixed hyperlipidemia: Secondary | ICD-10-CM | POA: Diagnosis not present

## 2022-02-04 DIAGNOSIS — J449 Chronic obstructive pulmonary disease, unspecified: Secondary | ICD-10-CM | POA: Diagnosis not present

## 2022-02-04 DIAGNOSIS — E1151 Type 2 diabetes mellitus with diabetic peripheral angiopathy without gangrene: Secondary | ICD-10-CM | POA: Diagnosis not present

## 2022-02-04 DIAGNOSIS — N1831 Chronic kidney disease, stage 3a: Secondary | ICD-10-CM | POA: Diagnosis not present

## 2022-02-04 DIAGNOSIS — I130 Hypertensive heart and chronic kidney disease with heart failure and stage 1 through stage 4 chronic kidney disease, or unspecified chronic kidney disease: Secondary | ICD-10-CM | POA: Diagnosis not present

## 2022-02-04 DIAGNOSIS — F419 Anxiety disorder, unspecified: Secondary | ICD-10-CM | POA: Diagnosis not present

## 2022-02-04 DIAGNOSIS — E1169 Type 2 diabetes mellitus with other specified complication: Secondary | ICD-10-CM | POA: Diagnosis not present

## 2022-02-04 DIAGNOSIS — I5032 Chronic diastolic (congestive) heart failure: Secondary | ICD-10-CM | POA: Diagnosis not present

## 2022-02-07 DIAGNOSIS — I872 Venous insufficiency (chronic) (peripheral): Secondary | ICD-10-CM | POA: Diagnosis not present

## 2022-02-07 DIAGNOSIS — F319 Bipolar disorder, unspecified: Secondary | ICD-10-CM | POA: Diagnosis not present

## 2022-02-07 DIAGNOSIS — I130 Hypertensive heart and chronic kidney disease with heart failure and stage 1 through stage 4 chronic kidney disease, or unspecified chronic kidney disease: Secondary | ICD-10-CM | POA: Diagnosis not present

## 2022-02-07 DIAGNOSIS — E1122 Type 2 diabetes mellitus with diabetic chronic kidney disease: Secondary | ICD-10-CM | POA: Diagnosis not present

## 2022-02-07 DIAGNOSIS — Z4781 Encounter for orthopedic aftercare following surgical amputation: Secondary | ICD-10-CM | POA: Diagnosis not present

## 2022-02-07 DIAGNOSIS — F419 Anxiety disorder, unspecified: Secondary | ICD-10-CM | POA: Diagnosis not present

## 2022-02-07 DIAGNOSIS — E1151 Type 2 diabetes mellitus with diabetic peripheral angiopathy without gangrene: Secondary | ICD-10-CM | POA: Diagnosis not present

## 2022-02-07 DIAGNOSIS — I5032 Chronic diastolic (congestive) heart failure: Secondary | ICD-10-CM | POA: Diagnosis not present

## 2022-02-07 DIAGNOSIS — E782 Mixed hyperlipidemia: Secondary | ICD-10-CM | POA: Diagnosis not present

## 2022-02-07 DIAGNOSIS — E1169 Type 2 diabetes mellitus with other specified complication: Secondary | ICD-10-CM | POA: Diagnosis not present

## 2022-02-07 DIAGNOSIS — N1831 Chronic kidney disease, stage 3a: Secondary | ICD-10-CM | POA: Diagnosis not present

## 2022-02-07 DIAGNOSIS — J449 Chronic obstructive pulmonary disease, unspecified: Secondary | ICD-10-CM | POA: Diagnosis not present

## 2022-02-07 DIAGNOSIS — T8744 Infection of amputation stump, left lower extremity: Secondary | ICD-10-CM | POA: Diagnosis not present

## 2022-02-08 DIAGNOSIS — T8789 Other complications of amputation stump: Secondary | ICD-10-CM | POA: Diagnosis not present

## 2022-02-09 ENCOUNTER — Inpatient Hospital Stay: Payer: BC Managed Care – PPO | Attending: Internal Medicine

## 2022-02-09 ENCOUNTER — Inpatient Hospital Stay (HOSPITAL_BASED_OUTPATIENT_CLINIC_OR_DEPARTMENT_OTHER): Payer: BC Managed Care – PPO | Admitting: Hospice and Palliative Medicine

## 2022-02-09 ENCOUNTER — Inpatient Hospital Stay: Payer: BC Managed Care – PPO

## 2022-02-09 ENCOUNTER — Inpatient Hospital Stay (HOSPITAL_BASED_OUTPATIENT_CLINIC_OR_DEPARTMENT_OTHER): Payer: BC Managed Care – PPO | Admitting: Nurse Practitioner

## 2022-02-09 VITALS — BP 137/83 | HR 67 | Temp 97.1°F | Resp 18

## 2022-02-09 DIAGNOSIS — Z9011 Acquired absence of right breast and nipple: Secondary | ICD-10-CM | POA: Diagnosis not present

## 2022-02-09 DIAGNOSIS — C50511 Malignant neoplasm of lower-outer quadrant of right female breast: Secondary | ICD-10-CM | POA: Diagnosis not present

## 2022-02-09 DIAGNOSIS — Z170 Estrogen receptor positive status [ER+]: Secondary | ICD-10-CM

## 2022-02-09 DIAGNOSIS — Z79811 Long term (current) use of aromatase inhibitors: Secondary | ICD-10-CM

## 2022-02-09 DIAGNOSIS — Z7189 Other specified counseling: Secondary | ICD-10-CM

## 2022-02-09 DIAGNOSIS — Z5181 Encounter for therapeutic drug level monitoring: Secondary | ICD-10-CM | POA: Diagnosis not present

## 2022-02-09 DIAGNOSIS — Z79818 Long term (current) use of other agents affecting estrogen receptors and estrogen levels: Secondary | ICD-10-CM

## 2022-02-09 LAB — COMPREHENSIVE METABOLIC PANEL
ALT: 11 U/L (ref 0–44)
AST: 11 U/L — ABNORMAL LOW (ref 15–41)
Albumin: 3.1 g/dL — ABNORMAL LOW (ref 3.5–5.0)
Alkaline Phosphatase: 126 U/L (ref 38–126)
Anion gap: 7 (ref 5–15)
BUN: 19 mg/dL (ref 6–20)
CO2: 25 mmol/L (ref 22–32)
Calcium: 9.3 mg/dL (ref 8.9–10.3)
Chloride: 102 mmol/L (ref 98–111)
Creatinine, Ser: 0.73 mg/dL (ref 0.44–1.00)
GFR, Estimated: >60 mL/min (ref >60)
Glucose, Bld: 243 mg/dL — ABNORMAL HIGH (ref 70–99)
Potassium: 3.7 mmol/L (ref 3.5–5.1)
Sodium: 134 mmol/L — ABNORMAL LOW (ref 135–145)
Total Bilirubin: 0.6 mg/dL (ref 0.3–1.2)
Total Protein: 8.8 g/dL — ABNORMAL HIGH (ref 6.5–8.1)

## 2022-02-09 LAB — CBC WITH DIFFERENTIAL/PLATELET
Abs Immature Granulocytes: 0.01 10*3/uL (ref 0.00–0.07)
Basophils Absolute: 0.1 10*3/uL (ref 0.0–0.1)
Basophils Relative: 1 %
Eosinophils Absolute: 0.3 10*3/uL (ref 0.0–0.5)
Eosinophils Relative: 5 %
HCT: 33.1 % — ABNORMAL LOW (ref 36.0–46.0)
Hemoglobin: 10.1 g/dL — ABNORMAL LOW (ref 12.0–15.0)
Immature Granulocytes: 0 %
Lymphocytes Relative: 14 %
Lymphs Abs: 0.8 10*3/uL (ref 0.7–4.0)
MCH: 23.9 pg — ABNORMAL LOW (ref 26.0–34.0)
MCHC: 30.5 g/dL (ref 30.0–36.0)
MCV: 78.3 fL — ABNORMAL LOW (ref 80.0–100.0)
Monocytes Absolute: 0.3 10*3/uL (ref 0.1–1.0)
Monocytes Relative: 6 %
Neutro Abs: 3.9 10*3/uL (ref 1.7–7.7)
Neutrophils Relative %: 74 %
Platelets: 167 10*3/uL (ref 150–400)
RBC: 4.23 MIL/uL (ref 3.87–5.11)
RDW: 18.1 % — ABNORMAL HIGH (ref 11.5–15.5)
WBC: 5.3 10*3/uL (ref 4.0–10.5)
nRBC: 0 % (ref 0.0–0.2)

## 2022-02-09 MED ORDER — LEUPROLIDE ACETATE (3 MONTH) 11.25 MG IM KIT
11.2500 mg | PACK | Freq: Once | INTRAMUSCULAR | Status: AC
  Administered 2022-02-09: 11.25 mg via INTRAMUSCULAR
  Filled 2022-02-09: qty 11.25

## 2022-02-09 NOTE — Progress Notes (Signed)
[place] NOTE  Patient Care Team: Pleas Koch, NP as PCP - General (Internal Medicine) Rosamaria Lints, MD (Inactive) as Referring Physician (Neurology) Alvester Chou, NP (Nurse Practitioner) Renato Shin, MD (Inactive) as Consulting Physician (Endocrinology) Theodore Demark, RN (Inactive) as Oncology Nurse Navigator Ronny Bacon, MD as Consulting Physician (General Surgery) [facility] as Registered Nurse Flora Lipps, MD as Consulting Physician (Pulmonary Disease) Sherlynn Stalls, MD as Consulting Physician (Ophthalmology) Cammie Sickle, MD as Consulting Physician (Oncology)  CHIEF COMPLAINTS/PURPOSE OF CONSULTATION: Breast cancer  #  Oncology History Overview Note  #April 2021-2.5 cm right breast mass [incidental-CT scan chest]  # April 2021- RIGHT BREAST [facility]; s/p ER- 51-90%; PR- 51-90%; Her- 2-NEG. G-2. [Dr.Rodenberg]; s/p  Masectomy- pT2 pN0 (sn); Tamoxifen; Poor candidate for chemo;   # Discontinue tamoxifen early DEC 2021 [sec to nausea/vomiting diarrhea]  #Late December 2021- Anastrazole + Lupron 11.25 every 3 months  # DM- poorly controlled; Seizure disorder/Bipolar/TIA ; Diabetes PN- G-3 [cane/walker; falls]; COPD; OSA active smoker  # SURVIVORSHIP:   # GENETICS:   DIAGNOSIS: Right breast cancer  STAGE:    II     ;  GOALS: goal  CURRENT/MOST RECENT THERAPY : Anastrazole + Lupron 11.25 every 3 months     Carcinoma of lower-outer quadrant of right breast in female, estrogen receptor positive ([place])  11/12/2019 Initial Diagnosis   Carcinoma of lower-outer quadrant of right breast in female, estrogen receptor positive ([place])    HISTORY OF PRESENTING ILLNESS: Patient in independently.  Accompanied by husband.  [name] 51 y.o.  female premenopausal patient with right-sided stage II ER/PR positive HER-2 negative breast cancer on adjuvant anastrozole + Lupron with multiple comorbidities-including poorly  controlled diabetes/bipolar disorder/active smoker/neuropathy is here for follow-up. She has history of right mastectomy in 2021. Last left mammogram was April 2022.   In interim, she has had to undergo amputation of bilateral lower extremities. She suffered MRSA infection of left BKA stump. She's had wound vac. Continues to have trouble controlling blood sugars. Reports cutting down on smoking but continues to smoke. She denies new breast lumps, pain, skin changes. She reports significant impact of her recent health changes on her quality of life. She would like to meet with palliative care.   Review of Systems  Constitutional:  Positive for malaise/fatigue. Negative for chills, diaphoresis, fever and weight loss.  HENT:  Negative for nosebleeds and sore throat.   Eyes:  Negative for double vision.  Respiratory:  Negative for cough, hemoptysis, sputum production, shortness of breath and wheezing.   Cardiovascular:  Negative for chest pain, palpitations, orthopnea and leg swelling.  Gastrointestinal:  Negative for abdominal pain, blood in stool, constipation, diarrhea, heartburn, melena, nausea and vomiting.  Genitourinary:  Negative for dysuria, frequency and urgency.  Musculoskeletal:  Positive for back pain, joint pain and myalgias.       Amputation with wound vac  Skin:  Negative for itching.  Neurological:  Positive for tingling. Negative for dizziness, focal weakness, weakness and headaches.  Endo/Heme/Allergies:  Does not bruise/bleed easily.  Psychiatric/Behavioral:  Negative for depression. The patient is not nervous/anxious and does not have insomnia.      MEDICAL HISTORY:  Past Medical History:  Diagnosis Date   Anxiety    takes Prozac daily   Anxiety    Aortic valve calcification    Asthma    Advair and Spirva daily   Asthma    Bipolar disorder ([place])  Breast cancer, right breast ([place]) 12/23/2019   CAD (coronary artery disease)    a. LHC 11/2013 done for CP/fluid  retention: mild disease in prox LAD, mild-mod disease in mRCA, EF 60% with normal LVEDP. b. Normal nuc 03/2016.   Cancer ([place])    Cellulitis and abscess of trunk 03/12/2020   CHF (congestive heart failure) (HCC)    Chronic diastolic CHF (congestive heart failure) (HCC)    Chronic heart failure with preserved ejection fraction ([place]) 11/16/2018   CKD (chronic kidney disease), stage II    COPD (chronic obstructive pulmonary disease) (HCC)    a. nocturnal O2.   COPD (chronic obstructive pulmonary disease) (HCC)    Coronary artery disease    Decreased urine stream    Diabetes mellitus    Diabetes mellitus without complication (HCC)    Dyspnea    Elevated C-reactive protein (CRP) 01/29/2018   Elevated sedimentation rate 01/29/2018   Elevated serum hCG 01/19/2018   Family history of adverse reaction to anesthesia    mom gets nauseated   Foot pain, bilateral 05/22/2018   GERD (gastroesophageal reflux disease)    takes Pepcid daily   History of blood clots    left leg 3-67yr ago   Hyperlipidemia    Hypertension    Hypertriglyceridemia    Inguinal hernia, left 01/2015   Muscle spasm    Open wound of genital labia    Peripheral neuropathy    RBBB    Seizures ([place]    Sepsis ([place] 01/19/2018   Sinus tachycardia    a. persistent since 2009.   Smokers' cough ([place]    Status post mastectomy, right 01/14/2020   Stroke ([facility] 1989   left sided weakness   TIA (transient ischemic attack)    Tobacco abuse    Vulvar abscess 01/23/2018    SURGICAL HISTORY: Past Surgical History:  Procedure Laterality Date   AMPUTATION Left 10/11/2021   Procedure: AMPUTATION BELOW KNEE;  Surgeon: DAlgernon Huxley MD;  [facility];  Service: General;  Laterality: Left;   AMPUTATION Right 11/15/2021   Procedure: BELOW KNEE AMPUTATION;  Surgeon: SKatha Cabal MD;  Location: [facility];  Service: Vascular;  Laterality: Right;   APPLICATION OF WOUND VAC Right 01/29/2020   Procedure: APPLICATION OF WOUND VAC;   Surgeon: RRonny Bacon MD;  [facility];  Service: General;  Laterality: Right;   APPLICATION OF WOUND VAC Left 11/15/2021   Procedure: APPLICATION OF WOUND VAC;  Surgeon: SKatha Cabal MD;  Location: [facility];  Service: Vascular;  Laterality: Left;   BREAST BIOPSY Right 11/06/2019   uKoreacore path pending venus clip   GRAFT APPLICATION  37/51/7001  Procedure: GRAFT APPLICATION;  Surgeon: EEdrick Kins DPM;  Location: [facility];  Service: Podiatry;;   HERNIA REPAIR Left    I & D EXTREMITY Left 11/05/2021   Procedure: IRRIGATION AND DEBRIDEMENT LEFT BELOW KNEE STUMP;  Surgeon: SKatha Cabal MD;  Location: [facility];  Service: Vascular;  Laterality: Left;   INCISION AND DRAINAGE Bilateral 09/21/2021   Procedure: INCISION AND DRAINAGE;  Surgeon: EEdrick Kins DPM;  Location: [facility];  Service: Podiatry;  Laterality: Bilateral;   INCISION AND DRAINAGE ABSCESS N/A 01/26/2018   Procedure: INCISION AND DEBRIDEMENT OF VULVAR NECROTIZING SOFT TISSUE INFECTION;  Surgeon: WGreer Pickerel MD;  [place]  Service: General;  Laterality: N/A;   INCISION AND DRAINAGE ABSCESS N/A 07/21/2021   Procedure: INCISION AND DRAINAGE ABSCESS Scalp;  Surgeon: BHervey Ard  W, MD;  Location: [facility];  Service: General;  Laterality: N/A;  scalp and right abdomen   INCISION AND DRAINAGE PERIRECTAL ABSCESS N/A 01/22/2018   Procedure: IRRIGATION AND DEBRIDEMENT LABIAL/VULVAR AREA;  Surgeon: Coralie Keens, MD;  Location: [place];  Service: General;  Laterality: N/A;   INCISION AND DRAINAGE PERIRECTAL ABSCESS N/A 01/29/2018   Procedure: IRRIGATION AND DEBRIDEMENT VULVA;  Surgeon: Excell Seltzer, MD;  Location: [place];  Service: General;  Laterality: N/A;   INGUINAL HERNIA REPAIR Left 04/08/2015   Procedure: OPEN LEFT INGUINAL HERNIA REPAIR WITH MESH;  Surgeon: Ralene Ok, MD;  Location: [place];  Service: General;  Laterality: Left;   INSERTION OF MESH Left 04/08/2015   Procedure: INSERTION OF  MESH;  Surgeon: Ralene Ok, MD;  Location: [place];  Service: General;  Laterality: Left;   IRRIGATION AND DEBRIDEMENT ABSCESS N/A 07/21/2021   Procedure: IRRIGATION AND DEBRIDEMENT ABSCESS abdomen;  Surgeon: Robert Bellow, MD;  Location: [facility];  Service: General;  Laterality: N/A;   LAPAROSCOPY     Endometriosis   LEFT HEART CATHETERIZATION WITH CORONARY ANGIOGRAM N/A 12/05/2013   Procedure: LEFT HEART CATHETERIZATION WITH CORONARY ANGIOGRAM;  Surgeon: Jettie Booze, MD;  Location: [facility] LAB;  Service: Cardiovascular;  Laterality: N/A;   LOWER EXTREMITY ANGIOGRAPHY Left 09/22/2021   Procedure: Lower Extremity Angiography;  Surgeon: Katha Cabal, MD;  Location: [place] CV LAB;  Service: Cardiovascular;  Laterality: Left;   LOWER EXTREMITY ANGIOGRAPHY Right 11/09/2021   Procedure: Lower Extremity Angiography;  Surgeon: Katha Cabal, MD;  Location: [place] CV LAB;  Service: Cardiovascular;  Laterality: Right;   MASTECTOMY Right    MINOR APPLICATION OF WOUND VAC Left 11/05/2021   Procedure: MINOR APPLICATION OF WOUND VAC;  Surgeon: Katha Cabal, MD;  Location: [facility];  Service: Vascular;  Laterality: Left;   right kidney drained     SIMPLE MASTECTOMY WITH AXILLARY SENTINEL NODE BIOPSY Right 12/23/2019   Procedure: Right SIMPLE MASTECTOMY WITH AXILLARY SENTINEL NODE BIOPSY;  Surgeon: Ronny Bacon, MD;  Location: [facility];  Service: General;  Laterality: Right;   TEE WITHOUT CARDIOVERSION N/A 11/28/2013   Procedure: TRANSESOPHAGEAL ECHOCARDIOGRAM (TEE);  Surgeon: Thayer Headings, MD;  Location: [place];  Service: Cardiovascular;  Laterality: N/A;   TEE WITHOUT CARDIOVERSION N/A 10/13/2021   Procedure: TRANSESOPHAGEAL ECHOCARDIOGRAM (TEE);  Surgeon: Corey Skains, MD;  Location: [facility];  Service: Cardiovascular;  Laterality: N/A;   WOUND DEBRIDEMENT Right 01/29/2020   Procedure: DEBRIDEMENT WOUND, Excisional debridement skin, subcutaneous and  muscle right chest wall;  Surgeon: Ronny Bacon, MD;  Location: [facility];  Service: General;  Laterality: Right;    SOCIAL HISTORY: Social History   Socioeconomic History   Marital status: Married    Spouse name: Not on file   Number of children: 2   Years of education: Not on file   Highest education level: Not on file  Occupational History   Not on file  Tobacco Use   Smoking status: Every Day    Packs/day: 0.25    Years: 36.00    Total pack years: 9.00    Types: Cigarettes    Start date: 07/11/1981   Smokeless tobacco: Never   Tobacco comments:    1 ppd now  Vaping Use   Vaping Use: Some days  Substance and Sexual Activity   Alcohol use: No   Drug use: No   Sexual activity: Not Currently  Other Topics Concern   Not on file  Social History Narrative   ** Merged History Encounter **       Lives in [place]   Married but lives with Boyfriend - not legally separated   Disabled - for BiPolar, Seizure disorder, diabetes   Formerly worked at [employer] 1 ppd; no alcohol. 2 step sons; no biologic children.       Social Determinants of Health   Financial Resource Strain: Not on file  Food Insecurity: No Food Insecurity (02/15/2021)   Hunger Vital Sign    Worried About Running Out of Food in the Last Year: Never true    Ran Out of Food in the Last Year: Never true  Transportation Needs: Not on file  Physical Activity: Unknown (09/14/2018)   Exercise Vital Sign    Days of Exercise per Week: Patient refused    Minutes of Exercise per Session: Patient refused  Stress: No Stress Concern Present (09/14/2018)   [employer] [place]    Feeling of Stress : Only a little  Social Connections: Unknown (09/14/2018)   Social Connection and Isolation Panel [NHANES]    Frequency of Communication with Friends and Family: More than three times a week    Frequency of Social Gatherings with Friends and Family: Patient  refused    Attends Religious Services: Patient refused    Marine scientist or Organizations: Patient refused    Attends Archivist Meetings: Patient refused    Marital Status: Married  Human resources officer Violence: Unknown (09/14/2018)   Humiliation, Afraid, Rape, and Kick questionnaire    Fear of Current or Ex-Partner: Patient refused    Emotionally Abused: Patient refused    Physically Abused: Patient refused    Sexually Abused: Patient refused    FAMILY HISTORY: Family History  Problem Relation Age of Onset   Venous thrombosis Brother    Other Brother        BRAIN TUMOR   Asthma Father    Diabetes Father    Coronary artery disease Mother    Hypertension Mother    Diabetes Mother    Breast cancer Mother 64   Asthma Sister    Diabetes type II Brother    Diabetes Brother     ALLERGIES:  is allergic to adhesive [tape], metoprolol, montelukast, morphine sulfate, penicillins, silicone, diltiazem, gabapentin, midodrine, and percocet [oxycodone-acetaminophen].  MEDICATIONS:  Current Outpatient Medications  Medication Sig Dispense Refill   acetaminophen (TYLENOL) 325 MG tablet Take 2 tablets (650 mg total) by mouth every 4 (four) hours as needed for headache or mild pain.     albuterol (VENTOLIN HFA) 108 (90 Base) MCG/ACT inhaler Inhale 2 puffs into the lungs every 6 (six) hours as needed for wheezing or shortness of breath.     ALPRAZolam (XANAX) 1 MG tablet Take 1 tablet (1 mg total) by mouth 4 (four) times daily as needed for anxiety. 5 tablet 0   anastrozole (ARIMIDEX) 1 MG tablet Take 1 tablet (1 mg total) by mouth daily. 30 tablet 0   aspirin EC 81 MG tablet Take 81 mg by mouth daily before breakfast.     budesonide-formoterol (SYMBICORT) 80-4.5 MCG/ACT inhaler INHALE 2 PUFFS BY MOUTH EVERY 12 HOURS TO PREVENT COUGH OR WHEEZING *RINSE MOUTH AFTER EACH USE* 10.2 g 2   busPIRone (BUSPAR) 30 MG tablet Take 30 mg by mouth 2 (two) times daily.     citalopram (CELEXA)  20 MG tablet Take 1 tablet (20 mg  total) by mouth daily. 30 tablet 2   desvenlafaxine (PRISTIQ) 100 MG 24 hr tablet Take 100 mg by mouth daily.     EYLEA 2 MG/0.05ML SOSY      insulin aspart (NOVOLOG) 100 UNIT/ML FlexPen Inject 1-9 Units into the skin 4 (four) times daily -  before meals and at bedtime. Glucose 121 - 150: 1 unit, Glucose 151 - 200: 2 units, Glucose 201 - 250: 3 units, Glucose 251 - 300: 5 units, Glucose 301 - 350: 7 units, Glucose 351 - 400: 9 units, Glucose > 400 call MD 15 mL 11   insulin glargine (LANTUS) 100 UNIT/ML Solostar Pen Inject 35 Units into the skin daily. 15 mL 11   naloxone (NARCAN) nasal spray 4 mg/0.1 mL SMARTSIG:Spray(s) In Nostril     Omadacycline Tosylate (NUZYRA) 150 MG TABS Take 3 tablets by mouth on days 1 and 2, then take 2 tablets by mouth daily thereafter. Take on an empty stomach. 30 tablet 0   ondansetron (ZOFRAN) 8 MG tablet One pill every 8 hours as needed for nausea/vomitting. 40 tablet 1   oxyCODONE (OXY IR/ROXICODONE) 5 MG immediate release tablet Take 1 tablet (5 mg total) by mouth every 6 (six) hours as needed for severe pain. 30 tablet 0   OXYGEN Inhale 2-4 L into the lungs 2 (two) times daily as needed (shortness of breath).     pregabalin (LYRICA) 300 MG capsule TAKE 1 CAPSULE BY MOUTH TWICE DAILY FOR  NERVE  PAIN 180 capsule 0   rOPINIRole (REQUIP) 1 MG tablet TAKE 1 TABLET BY MOUTH ONCE DAILY AT BEDTIME FOR  RESTLESS  LEG 90 tablet 0   rosuvastatin (CRESTOR) 10 MG tablet Take 1 tablet (10 mg total) by mouth at bedtime. 30 tablet 0   spironolactone (ALDACTONE) 50 MG tablet Take 50 mg by mouth daily.     topiramate (TOPAMAX) 50 MG tablet TAKE 1 TABLET BY MOUTH TWICE DAILY FOR HEADACHE PREVENTION 180 tablet 0   traZODone (DESYREL) 100 MG tablet Take 200 mg by mouth at bedtime.     No current facility-administered medications for this visit.    Marland Kitchen  PHYSICAL EXAMINATION: ECOG PERFORMANCE STATUS: 1 - Symptomatic but completely  ambulatory  Vitals:   02/09/22 0903  BP: 137/83  Pulse: 67  Resp: 18  Temp: (!) 97.1 F (36.2 C)  There were no vitals filed for this visit.   Physical Exam Constitutional:      Appearance: She is not ill-appearing.  Eyes:     General: No scleral icterus.    Conjunctiva/sclera: Conjunctivae normal.  Cardiovascular:     Rate and Rhythm: Normal rate and regular rhythm.  Abdominal:     Palpations: Abdomen is soft.     Tenderness: There is no abdominal tenderness. There is no guarding.  Musculoskeletal:     Right lower leg: No edema.     Left lower leg: No edema.     Right Lower Extremity: Right leg is amputated below knee.     Left Lower Extremity: Left leg is amputated below knee.  Lymphadenopathy:     Cervical: No cervical adenopathy.  Skin:    General: Skin is warm and dry.  Neurological:     Mental Status: She is alert and oriented to person, place, and time. Mental status is at baseline.  Psychiatric:        Mood and Affect: Mood normal.        Behavior: Behavior normal.    LABORATORY DATA:  I have reviewed the data as listed Lab Results  Component Value Date   WBC 5.3 02/09/2022   HGB 10.1 (L) 02/09/2022   HCT 33.1 (L) 02/09/2022   MCV 78.3 (L) 02/09/2022   PLT 167 02/09/2022   Recent Labs    12/10/21 0740 12/11/21 0432 12/12/21 2005 12/13/21 0524 01/13/22 1141  NA 137  --  139 138 136  K 4.0  --  4.2 3.9 4.4  CL 107  --  107 109 106  CO2 25  --  _0 GLUCOSE 176*  --  142* 111* 243*  BUN 18  --  16 15 23*  CREATININE 0.75   < > 0.77 0.66 0.71  CALCIUM 8.4*  --  8.7* 8.9 9.0  GFRNONAA >60   < > >60 >60 >60  PROT 7.8  --  8.6* 8.0 8.8*  ALBUMIN 2.3*  --  2.5* 2.5* 2.9*  AST 8*  --  12* 11* 12*  ALT 7  --  _1 ALKPHOS 121  --  137* 126 127*  BILITOT 0.8  --  1.0 0.6 0.9  BILIDIR 0.3*  --   --   --   --   IBILI 0.5  --   --   --   --    < > = values in this interval not displayed.    RADIOGRAPHIC STUDIES: I have personally reviewed  the radiological images as listed and agreed with the findings in the report. No results found.   ASSESSMENT & PLAN:   No problem-specific Assessment & Plan notes found for this encounter.  Carcinoma of lower-outer quadrant of right breast in female, estrogen receptor positive ([place]) # T2N0- ER/PR-positive- hx of right mastectomy in 2021. Premenopausal currently on Anastrazole [stopped tam sec to poor tolerance]. Continue anastrozole; tolerating better. Imaging March 08, 2021- NED. Last lupron was 09/10/21. Now overdue due to amputation & infection. Labs reviewed. Proceed with lupron 11.5 mg today and plan to repeat in 3 months.   Left breast mammogram- 10/19/20 was birads category 1: negative. Overdue d/t comorbidities. Ordered today.   # Goals of care- will get patient added to Palliative care schedule today. Discussed with Josh.   # BKA bilaterally- wound vac in place. Complicated post operative course. Now slowly improving.   # Headache/migraines-awaiting neurology evaluation do not suspect for malignancy; [[facility] PCP]- STABLE.  #Depression/anxiety/bipolar-currently on BuSpar/Celexa/trazodone As per psychiatry- STABLE.  #Chronic respiratory failure-CHF/COPD; STABLE. SEP 2022-CT scan [bronchiolitis from smoking]; discussed at length regarding quitting smoking/ states cutting down.   #Poorly controlled diabetes-on insulin; FBG AM- 317- [Dr.O'Connell endocrinology]-  Again stressed the importance of glucose control; especially with the neuropathy/lower extremity ulcers.  #Peripheral neuropathy grade 2-3; patient goes on with a walker/cane at baseline- STABLE.   # Anemia- May 2023 iron stores were normal. Hemoglobin  slowly improving to 10.1. Can recheck iron studies at next visit to evaluate for possible benefit from iv iron given blood loss with surgery.   #Genetics: mom- breast cancer; older brother at 21s- brain tumor; dad- brain tumor. # Genetics: Discussed with the patient majority of  breast cancers are sporadic however 10 to 20% at risk of genetic/hereditary cancer syndromes.  Discussed importance of genetic counseling/genetic testing. Patient interested in genetic counseling.  Missed March appointment. Will send message to reschedule   DISPOSITION:  # [place] today # reschedule genetics appointment.  # ref palliative care -  # 3 mo- lab (cbc, cmp, ca27-29), Dr. [name], +/-  eligard  All questions were answered. The patient knows to call the clinic with any problems, questions or concerns.  Verlon Au, NP 02/09/2022

## 2022-02-09 NOTE — Progress Notes (Signed)
Symptom Management and [place] at [facility] [phone] [phone]  Patient Care Team: Pleas Koch, NP as PCP - General (Internal Medicine) Rosamaria Lints, MD (Inactive) as Referring Physician (Neurology) Alvester Chou, NP (Nurse Practitioner) Renato Shin, MD (Inactive) as Consulting Physician (Endocrinology) Theodore Demark, RN (Inactive) as Oncology Nurse Navigator Ronny Bacon, MD as Consulting Physician (General Surgery) [facility] as Registered Nurse Flora Lipps, MD as Consulting Physician (Pulmonary Disease) Sherlynn Stalls, MD as Consulting Physician (Ophthalmology) Cammie Sickle, MD as Consulting Physician (Oncology)   NAME OF PATIENT: [name]  [id number]  01-26-1971   DATE OF VISIT: 02/09/22  REASON FOR CONSULT: [name] is a 51 y.o. female with multiple medical problems including stage II right-sided ER/PR positive, HER2 negative breast cancer on adjuvant anastrozole and Lupron with multiple comorbidities including poorly controlled diabetes/bipolar disorder/active smoker/neuropathy.  Patient is status post right mastectomy in 2021.  She has recent amputation of bilateral lower extremities complicated by MRSA infection of left BKA.    INTERVAL HISTORY: Patient presents to clinic with husband.  She was an add-on to my clinic schedule today after seeing Beckey Rutter, NP.  Denies any neurologic complaints. Denies recent fevers or illnesses. Denies any easy bleeding or bruising. Reports good appetite and denies weight loss. Denies chest pain. Denies any nausea, vomiting, constipation, or diarrhea. Denies urinary complaints. Patient offers no further specific complaints today.  SOCIAL HISTORY:     reports that she has been smoking cigarettes. She started smoking about 40 years ago. She has a 9.00 pack-year smoking history. She has never used smokeless tobacco. She  reports that she does not drink alcohol and does not use drugs.  Patient is married lives at home with her husband and son.  She has another son who lives nearby.  Patient previously worked as a Administrator.  ADVANCE DIRECTIVES:  Does not have  CODE STATUS: DNR/DNI (DNR form at home per patient)   PAST MEDICAL HISTORY: Past Medical History:  Diagnosis Date   Anxiety    takes Prozac daily   Anxiety    Aortic valve calcification    Asthma    Advair and Spirva daily   Asthma    Bipolar disorder ([place])    Breast cancer, right breast ([place]) 12/23/2019   CAD (coronary artery disease)    a. LHC 11/2013 done for CP/fluid retention: mild disease in prox LAD, mild-mod disease in mRCA, EF 60% with normal LVEDP. b. Normal nuc 03/2016.   Cancer ([place])    Cellulitis and abscess of trunk 03/12/2020   CHF (congestive heart failure) (HCC)    Chronic diastolic CHF (congestive heart failure) (HCC)    Chronic heart failure with preserved ejection fraction ([place]) 11/16/2018   CKD (chronic kidney disease), stage II    COPD (chronic obstructive pulmonary disease) (HCC)    a. nocturnal O2.   COPD (chronic obstructive pulmonary disease) (HCC)    Coronary artery disease    Decreased urine stream    Diabetes mellitus    Diabetes mellitus without complication (HCC)    Dyspnea    Elevated C-reactive protein (CRP) 01/29/2018   Elevated sedimentation rate 01/29/2018   Elevated serum hCG 01/19/2018   Family history of adverse reaction to anesthesia    mom gets nauseated   Foot pain, bilateral 05/22/2018   GERD (gastroesophageal reflux disease)    takes Pepcid daily   History of blood clots    left  leg 3-72yr ago   Hyperlipidemia    Hypertension    Hypertriglyceridemia    Inguinal hernia, left 01/2015   Muscle spasm    Open wound of genital labia    Peripheral neuropathy    RBBB    [place]    Sepsis ([place] 01/19/2018   Sinus tachycardia    a. persistent since 2009.   Smokers' cough ([place]     Status post mastectomy, right 01/14/2020   Stroke ([facility] 1989   left sided weakness   TIA (transient ischemic attack)    Tobacco abuse    Vulvar abscess 01/23/2018    PAST SURGICAL HISTORY:  Past Surgical History:  Procedure Laterality Date   AMPUTATION Left 10/11/2021   Procedure: AMPUTATION BELOW KNEE;  Surgeon: DAlgernon Huxley MD;  [facility];  Service: General;  Laterality: Left;   AMPUTATION Right 11/15/2021   Procedure: BELOW KNEE AMPUTATION;  Surgeon: SKatha Cabal MD;  Location: [facility];  Service: Vascular;  Laterality: Right;   APPLICATION OF WOUND VAC Right 01/29/2020   Procedure: APPLICATION OF WOUND VAC;  Surgeon: RRonny Bacon MD;  [place];  Service: General;  Laterality: Right;   APPLICATION OF WOUND VAC Left 11/15/2021   Procedure: APPLICATION OF WOUND VAC;  Surgeon: SKatha Cabal MD;  Location: [facility];  Service: Vascular;  Laterality: Left;   BREAST BIOPSY Right 11/06/2019   uKoreacore path pending venus clip   GRAFT APPLICATION  [id number]/6712  Procedure: GRAFT APPLICATION;  Surgeon: EEdrick Kins DPM;  Location: [facility];  Service: Podiatry;;   HERNIA REPAIR Left    I & D EXTREMITY Left 11/05/2021   Procedure: IRRIGATION AND DEBRIDEMENT LEFT BELOW KNEE STUMP;  Surgeon: SKatha Cabal MD;  Location: [facility];  Service: Vascular;  Laterality: Left;   INCISION AND DRAINAGE Bilateral 09/21/2021   Procedure: INCISION AND DRAINAGE;  Surgeon: EEdrick Kins DPM;  Location: [facility];  Service: Podiatry;  Laterality: Bilateral;   INCISION AND DRAINAGE ABSCESS N/A 01/26/2018   Procedure: INCISION AND DEBRIDEMENT OF VULVAR NECROTIZING SOFT TISSUE INFECTION;  Surgeon: WGreer Pickerel MD;  [place]  Service: General;  Laterality: N/A;   INCISION AND DRAINAGE ABSCESS N/A 07/21/2021   Procedure: INCISION AND DRAINAGE ABSCESS Scalp;  Surgeon: BRobert Bellow MD;  [facility];  Service: General;  Laterality: N/A;  scalp and right abdomen    INCISION AND DRAINAGE PERIRECTAL ABSCESS N/A 01/22/2018   Procedure: IRRIGATION AND DEBRIDEMENT LABIAL/VULVAR AREA;  Surgeon: BCoralie Keens MD;  [place] [facility]  Service: General;  Laterality: N/A;   INCISION AND DRAINAGE PERIRECTAL ABSCESS N/A 01/29/2018   Procedure: IRRIGATION AND DEBRIDEMENT VULVA;  Surgeon: HExcell Seltzer MD;  [place]  Service: General;  Laterality: N/A;   INGUINAL HERNIA REPAIR Left 04/08/2015   Procedure: OPEN LEFT INGUINAL HERNIA REPAIR WITH MESH;  Surgeon: ARalene Ok MD;  [place]  Service: General;  Laterality: Left;   INSERTION OF MESH Left 04/08/2015   Procedure: INSERTION OF MESH;  Surgeon: ARalene Ok MD;  [place] [facility]  Service: General;  Laterality: Left;   IRRIGATION AND DEBRIDEMENT ABSCESS N/A 07/21/2021   Procedure: IRRIGATION AND DEBRIDEMENT ABSCESS abdomen;  Surgeon: BRobert Bellow MD;  Location: [facility];  Service: General;  Laterality: N/A;   LAPAROSCOPY     Endometriosis   LEFT HEART CATHETERIZATION WITH CORONARY ANGIOGRAM N/A 12/05/2013   Procedure: LEFT HEART CATHETERIZATION WITH CORONARY ANGIOGRAM;  Surgeon: JCharlann Lange  Irish Lack, MD;  [facility];  Service: Cardiovascular;  Laterality: N/A;   LOWER EXTREMITY ANGIOGRAPHY Left 09/22/2021   Procedure: Lower Extremity Angiography;  Surgeon: Katha Cabal, MD;  Location: [place] CV LAB;  Service: Cardiovascular;  Laterality: Left;   LOWER EXTREMITY ANGIOGRAPHY Right 11/09/2021   Procedure: Lower Extremity Angiography;  Surgeon: Katha Cabal, MD;  Location: [place] CV LAB;  Service: Cardiovascular;  Laterality: Right;   MASTECTOMY Right    MINOR APPLICATION OF WOUND VAC Left 11/05/2021   Procedure: MINOR APPLICATION OF WOUND VAC;  Surgeon: Katha Cabal, MD;  Location: [facility];  Service: Vascular;  Laterality: Left;   right kidney drained     SIMPLE MASTECTOMY WITH AXILLARY SENTINEL NODE BIOPSY Right 12/23/2019   Procedure: Right  SIMPLE MASTECTOMY WITH AXILLARY SENTINEL NODE BIOPSY;  Surgeon: Ronny Bacon, MD;  Location: [facility];  Service: General;  Laterality: Right;   TEE WITHOUT CARDIOVERSION N/A 11/28/2013   Procedure: TRANSESOPHAGEAL ECHOCARDIOGRAM (TEE);  Surgeon: Thayer Headings, MD;  Location: [place];  Service: Cardiovascular;  Laterality: N/A;   TEE WITHOUT CARDIOVERSION N/A 10/13/2021   Procedure: TRANSESOPHAGEAL ECHOCARDIOGRAM (TEE);  Surgeon: Corey Skains, MD;  Location: [facility];  Service: Cardiovascular;  Laterality: N/A;   WOUND DEBRIDEMENT Right 01/29/2020   Procedure: DEBRIDEMENT WOUND, Excisional debridement skin, subcutaneous and muscle right chest wall;  Surgeon: Ronny Bacon, MD;  Location: [facility];  Service: General;  Laterality: Right;    HEMATOLOGY/ONCOLOGY HISTORY:  Oncology History Overview Note  #April 2021-2.5 cm right breast mass [incidental-CT scan chest]  # April 2021- RIGHT BREAST [place]; s/p ER- 51-90%; PR- 51-90%; Her- 2-NEG. G-2. [Dr.Rodenberg]; s/p  Masectomy- pT2 pN0 (sn); Tamoxifen; Poor candidate for chemo;   # Discontinue tamoxifen early DEC 2021 [sec to nausea/vomiting diarrhea]  #Late December 2021- Anastrazole + Lupron 11.25 every 3 months  # DM- poorly controlled; Seizure disorder/Bipolar/TIA ; Diabetes PN- G-3 [cane/walker; falls]; COPD; OSA active smoker  # SURVIVORSHIP:   # GENETICS:   DIAGNOSIS: Right breast cancer  STAGE:    II     ;  GOALS: goal  CURRENT/MOST RECENT THERAPY : Anastrazole + Lupron 11.25 every 3 months     Carcinoma of lower-outer quadrant of right breast in female, estrogen receptor positive ([place])  11/12/2019 Initial Diagnosis   Carcinoma of lower-outer quadrant of right breast in female, estrogen receptor positive ([place])     ALLERGIES:  is allergic to adhesive [tape], metoprolol, montelukast, morphine sulfate, penicillins, silicone, diltiazem, gabapentin, midodrine, and percocet [oxycodone-acetaminophen].  MEDICATIONS:   Current Outpatient Medications  Medication Sig Dispense Refill   acetaminophen (TYLENOL) 325 MG tablet Take 2 tablets (650 mg total) by mouth every 4 (four) hours as needed for headache or mild pain.     albuterol (VENTOLIN HFA) 108 (90 Base) MCG/ACT inhaler Inhale 2 puffs into the lungs every 6 (six) hours as needed for wheezing or shortness of breath.     ALPRAZolam (XANAX) 1 MG tablet Take 1 tablet (1 mg total) by mouth 4 (four) times daily as needed for anxiety. 5 tablet 0   anastrozole (ARIMIDEX) 1 MG tablet Take 1 tablet (1 mg total) by mouth daily. 30 tablet 0   aspirin EC 81 MG tablet Take 81 mg by mouth daily before breakfast.     budesonide-formoterol (SYMBICORT) 80-4.5 MCG/ACT inhaler INHALE 2 PUFFS BY MOUTH EVERY 12 HOURS TO PREVENT COUGH OR WHEEZING *RINSE MOUTH AFTER EACH USE* 10.2 g 2   busPIRone (BUSPAR)  30 MG tablet Take 30 mg by mouth 2 (two) times daily.     citalopram (CELEXA) 20 MG tablet Take 1 tablet (20 mg total) by mouth daily. 30 tablet 2   desvenlafaxine (PRISTIQ) 100 MG 24 hr tablet Take 100 mg by mouth daily.     EYLEA 2 MG/0.05ML SOSY      insulin aspart (NOVOLOG) 100 UNIT/ML FlexPen Inject 1-9 Units into the skin 4 (four) times daily -  before meals and at bedtime. Glucose 121 - 150: 1 unit, Glucose 151 - 200: 2 units, Glucose 201 - 250: 3 units, Glucose 251 - 300: 5 units, Glucose 301 - 350: 7 units, Glucose 351 - 400: 9 units, Glucose > 400 call MD 15 mL 11   insulin glargine (LANTUS) 100 UNIT/ML Solostar Pen Inject 35 Units into the skin daily. 15 mL 11   naloxone (NARCAN) nasal spray 4 mg/0.1 mL SMARTSIG:Spray(s) In Nostril     Omadacycline Tosylate (NUZYRA) 150 MG TABS Take 3 tablets by mouth on days 1 and 2, then take 2 tablets by mouth daily thereafter. Take on an empty stomach. 30 tablet 0   ondansetron (ZOFRAN) 8 MG tablet One pill every 8 hours as needed for nausea/vomitting. 40 tablet 1   oxyCODONE (OXY IR/ROXICODONE) 5 MG immediate release tablet Take 1  tablet (5 mg total) by mouth every 6 (six) hours as needed for severe pain. 30 tablet 0   OXYGEN Inhale 2-4 L into the lungs 2 (two) times daily as needed (shortness of breath).     pregabalin (LYRICA) 300 MG capsule TAKE 1 CAPSULE BY MOUTH TWICE DAILY FOR  NERVE  PAIN 180 capsule 0   rOPINIRole (REQUIP) 1 MG tablet TAKE 1 TABLET BY MOUTH ONCE DAILY AT BEDTIME FOR  RESTLESS  LEG 90 tablet 0   rosuvastatin (CRESTOR) 10 MG tablet Take 1 tablet (10 mg total) by mouth at bedtime. 30 tablet 0   spironolactone (ALDACTONE) 50 MG tablet Take 50 mg by mouth daily.     topiramate (TOPAMAX) 50 MG tablet TAKE 1 TABLET BY MOUTH TWICE DAILY FOR HEADACHE PREVENTION 180 tablet 0   traZODone (DESYREL) 100 MG tablet Take 200 mg by mouth at bedtime.     No current facility-administered medications for this visit.    VITAL SIGNS: LMP  (LMP Unknown)  There were no vitals filed for this visit.  Estimated body mass index is 37.84 kg/m as calculated from the following:   Height as of 12/12/21: _0  (1.626 m).   Weight as of 12/12/21: 220 lb 7.4 oz (100 kg).  LABS: CBC:    Component Value Date/Time   WBC 5.3 02/09/2022 0852   HGB 10.1 (L) 02/09/2022 0852   HGB 12.2 09/16/2021 0847   HCT 33.1 (L) 02/09/2022 0852   HCT 38.0 09/16/2021 0847   PLT 167 02/09/2022 0852   PLT 200 09/16/2021 0847   MCV 78.3 (L) 02/09/2022 0852   MCV 86 09/16/2021 0847   NEUTROABS 3.9 02/09/2022 0852   NEUTROABS 7.0 09/16/2021 0847   LYMPHSABS 0.8 02/09/2022 0852   LYMPHSABS 0.9 09/16/2021 0847   MONOABS 0.3 02/09/2022 0852   EOSABS 0.3 02/09/2022 0852   EOSABS 0.2 09/16/2021 0847   BASOSABS 0.1 02/09/2022 0852   BASOSABS 0.1 09/16/2021 0847   Comprehensive Metabolic Panel:    Component Value Date/Time   NA 134 (L) 02/09/2022 0852   NA 137 01/08/2018 1117   K 3.7 02/09/2022 0852   CL 102 02/09/2022 7893  CO2 25 02/09/2022 0852   BUN 19 02/09/2022 0852   BUN 31 (H) 01/08/2018 1117   CREATININE 0.73 02/09/2022 0852    CREATININE 1.59 (H) 07/18/2019 1506   GLUCOSE 243 (H) 02/09/2022 0852   CALCIUM 9.3 02/09/2022 0852   AST 11 (L) 02/09/2022 0852   ALT 11 02/09/2022 0852   ALKPHOS 126 02/09/2022 0852   BILITOT 0.6 02/09/2022 0852   BILITOT 0.5 01/08/2018 1117   PROT 8.8 (H) 02/09/2022 0852   PROT 7.8 01/08/2018 1117   ALBUMIN 3.1 (L) 02/09/2022 0852   ALBUMIN 4.1 01/08/2018 1117    RADIOGRAPHIC STUDIES: No results found.  PERFORMANCE STATUS (ECOG) : 4 - Bedbound  Review of Systems Unless otherwise noted, a complete review of systems is negative.  Physical Exam General: NAD Pulmonary: Unlabored Abdomen: soft, nontender, + bowel sounds GU: no suprapubic tenderness Extremities: Bilateral BKA Skin: no rashes Neurological: Weakness but otherwise nonfocal  IMPRESSION: I met briefly with patient and husband following their visit with Beckey Rutter, NP.  Patient describes a significant impact of her recent healthcare on her quality of life.  She would like to continue adjuvant anastrozole and Lupron but is not interested in pursuing further invasive procedures or aggressive work-up for cancers.  Quality of life is important to her and she is interested in increasing home support.  We will consult community palliative care.  Patient is interested in establishing ACP documents and I sent her home with a MOST form.  She states that she is a DNR and not interested in resuscitative measures at end-of-life.  She says she has a DNR order at home.    PLAN: -Continue current scope of treatment -Referral to community palliative care -DNR/DNI -ACP/MOST form sent home with patient -Follow-up telephone visit in 1 month   Patient expressed understanding and was in agreement with this plan. She also understands that She can call clinic at any time with any questions, concerns, or complaints.   Thank you for allowing me to participate in the care of this very pleasant patient.   Time Total: 15  minutes  Visit consisted of counseling and education dealing with the complex and emotionally intense issues of symptom management in the setting of serious illness.Greater than 50%  of this time was spent counseling and coordinating care related to the above assessment and plan.  Signed by: Altha Harm, PhD, NP-C

## 2022-02-10 ENCOUNTER — Other Ambulatory Visit: Payer: Self-pay | Admitting: Nurse Practitioner

## 2022-02-10 DIAGNOSIS — E1151 Type 2 diabetes mellitus with diabetic peripheral angiopathy without gangrene: Secondary | ICD-10-CM | POA: Diagnosis not present

## 2022-02-10 DIAGNOSIS — N1831 Chronic kidney disease, stage 3a: Secondary | ICD-10-CM | POA: Diagnosis not present

## 2022-02-10 DIAGNOSIS — I130 Hypertensive heart and chronic kidney disease with heart failure and stage 1 through stage 4 chronic kidney disease, or unspecified chronic kidney disease: Secondary | ICD-10-CM | POA: Diagnosis not present

## 2022-02-10 DIAGNOSIS — E782 Mixed hyperlipidemia: Secondary | ICD-10-CM | POA: Diagnosis not present

## 2022-02-10 DIAGNOSIS — I5032 Chronic diastolic (congestive) heart failure: Secondary | ICD-10-CM | POA: Diagnosis not present

## 2022-02-10 DIAGNOSIS — T8744 Infection of amputation stump, left lower extremity: Secondary | ICD-10-CM | POA: Diagnosis not present

## 2022-02-10 DIAGNOSIS — F419 Anxiety disorder, unspecified: Secondary | ICD-10-CM | POA: Diagnosis not present

## 2022-02-10 DIAGNOSIS — J449 Chronic obstructive pulmonary disease, unspecified: Secondary | ICD-10-CM | POA: Diagnosis not present

## 2022-02-10 DIAGNOSIS — Z1231 Encounter for screening mammogram for malignant neoplasm of breast: Secondary | ICD-10-CM

## 2022-02-10 DIAGNOSIS — E1122 Type 2 diabetes mellitus with diabetic chronic kidney disease: Secondary | ICD-10-CM | POA: Diagnosis not present

## 2022-02-10 DIAGNOSIS — I872 Venous insufficiency (chronic) (peripheral): Secondary | ICD-10-CM | POA: Diagnosis not present

## 2022-02-10 DIAGNOSIS — F319 Bipolar disorder, unspecified: Secondary | ICD-10-CM | POA: Diagnosis not present

## 2022-02-10 DIAGNOSIS — E1169 Type 2 diabetes mellitus with other specified complication: Secondary | ICD-10-CM | POA: Diagnosis not present

## 2022-02-10 DIAGNOSIS — Z4781 Encounter for orthopedic aftercare following surgical amputation: Secondary | ICD-10-CM | POA: Diagnosis not present

## 2022-02-10 LAB — CANCER ANTIGEN 27.29: CA 27.29: 35.5 U/mL (ref 0.0–38.6)

## 2022-02-10 LAB — FSH/LH
FSH: 5.5 m[IU]/mL
LH: <0.3 m[IU]/mL

## 2022-02-11 ENCOUNTER — Telehealth: Payer: Self-pay | Admitting: Primary Care

## 2022-02-11 DIAGNOSIS — Z4781 Encounter for orthopedic aftercare following surgical amputation: Secondary | ICD-10-CM | POA: Diagnosis not present

## 2022-02-11 DIAGNOSIS — T8744 Infection of amputation stump, left lower extremity: Secondary | ICD-10-CM | POA: Diagnosis not present

## 2022-02-11 DIAGNOSIS — F419 Anxiety disorder, unspecified: Secondary | ICD-10-CM | POA: Diagnosis not present

## 2022-02-11 DIAGNOSIS — E1122 Type 2 diabetes mellitus with diabetic chronic kidney disease: Secondary | ICD-10-CM | POA: Diagnosis not present

## 2022-02-11 DIAGNOSIS — I872 Venous insufficiency (chronic) (peripheral): Secondary | ICD-10-CM | POA: Diagnosis not present

## 2022-02-11 DIAGNOSIS — I5032 Chronic diastolic (congestive) heart failure: Secondary | ICD-10-CM | POA: Diagnosis not present

## 2022-02-11 DIAGNOSIS — I130 Hypertensive heart and chronic kidney disease with heart failure and stage 1 through stage 4 chronic kidney disease, or unspecified chronic kidney disease: Secondary | ICD-10-CM | POA: Diagnosis not present

## 2022-02-11 DIAGNOSIS — F319 Bipolar disorder, unspecified: Secondary | ICD-10-CM | POA: Diagnosis not present

## 2022-02-11 DIAGNOSIS — E1151 Type 2 diabetes mellitus with diabetic peripheral angiopathy without gangrene: Secondary | ICD-10-CM | POA: Diagnosis not present

## 2022-02-11 DIAGNOSIS — N1831 Chronic kidney disease, stage 3a: Secondary | ICD-10-CM | POA: Diagnosis not present

## 2022-02-11 DIAGNOSIS — E782 Mixed hyperlipidemia: Secondary | ICD-10-CM | POA: Diagnosis not present

## 2022-02-11 DIAGNOSIS — E1169 Type 2 diabetes mellitus with other specified complication: Secondary | ICD-10-CM | POA: Diagnosis not present

## 2022-02-11 DIAGNOSIS — J449 Chronic obstructive pulmonary disease, unspecified: Secondary | ICD-10-CM | POA: Diagnosis not present

## 2022-02-11 NOTE — Telephone Encounter (Signed)
Noted. Thank you for the update.

## 2022-02-11 NOTE — Telephone Encounter (Signed)
Radovan a Marine scientist with [employer] called and said patient will be discharged today, shes doing the best she can for now, she is going to try and do nursing for wound care but is fighting with her insurance.  Call back for Radovan is [phone]

## 2022-02-13 ENCOUNTER — Encounter (INDEPENDENT_AMBULATORY_CARE_PROVIDER_SITE_OTHER): Payer: Self-pay | Admitting: Nurse Practitioner

## 2022-02-13 NOTE — Progress Notes (Signed)
Subjective:    Patient ID: [name], female    DOB: 07/10/1971, 51 y.o.   MRN: [id number] Chief Complaint  Patient presents with   Wound Check    Left stump wound check    The patient returns today for follow-up evaluation of her bilateral below-knee amputations.  The right below-knee amputation is completely healed at this time.  No evidence of open wounds or dehiscence.  The left below-knee amputation had to undergo an incision and drainage with debridement.  The wound has closed substantially however there is still a large surface area.  She denies any fevers or chills.  She continues to follow with infectious disease.  She had a wound VAC but has been doing wet-to-dry dressings for the last several weeks.    Review of Systems  Skin:  Positive for wound.       Objective:   Physical Exam Vitals reviewed.  HENT:     Head: Normocephalic.  Cardiovascular:     Rate and Rhythm: Normal rate.  Pulmonary:     Effort: Pulmonary effort is normal.  Musculoskeletal:     Right Lower Extremity: Right leg is amputated below knee.     Left Lower Extremity: Left leg is amputated below knee.  Skin:    General: Skin is warm and dry.  Neurological:     Mental Status: She is alert and oriented to person, place, and time.  Psychiatric:        Mood and Affect: Mood normal.        Behavior: Behavior normal.        Thought Content: Thought content normal.        Judgment: Judgment normal.     BP 134/74 (BP Location: Left Arm)   Pulse 76   Resp 16   LMP  (LMP Unknown)   Past Medical History:  Diagnosis Date   Anxiety    takes Prozac daily   Anxiety    Aortic valve calcification    Asthma    Advair and Spirva daily   Asthma    Bipolar disorder (HCC)    Breast cancer, right breast ([place]) 12/23/2019   CAD (coronary artery disease)    a. LHC 11/2013 done for CP/fluid retention: mild disease in prox LAD, mild-mod disease in mRCA, EF 60% with normal LVEDP. b. Normal nuc 03/2016.    Cancer ([place])    Cellulitis and abscess of trunk 03/12/2020   CHF (congestive heart failure) (HCC)    Chronic diastolic CHF (congestive heart failure) (HCC)    Chronic heart failure with preserved ejection fraction ([place]) 11/16/2018   CKD (chronic kidney disease), stage II    COPD (chronic obstructive pulmonary disease) (HCC)    a. nocturnal O2.   COPD (chronic obstructive pulmonary disease) (HCC)    Coronary artery disease    Decreased urine stream    Diabetes mellitus    Diabetes mellitus without complication (HCC)    Dyspnea    Elevated C-reactive protein (CRP) 01/29/2018   Elevated sedimentation rate 01/29/2018   Elevated serum hCG 01/19/2018   Family history of adverse reaction to anesthesia    mom gets nauseated   Foot pain, bilateral 05/22/2018   GERD (gastroesophageal reflux disease)    takes Pepcid daily   History of blood clots    left leg 3-23yr ago   Hyperlipidemia    Hypertension    Hypertriglyceridemia    Inguinal hernia, left 01/2015   Muscle spasm    Open wound of  genital labia    Peripheral neuropathy    RBBB    Seizures ([place])    Sepsis ([place]) 01/19/2018   Sinus tachycardia    a. persistent since 2009.   Smokers' cough ([place])    Status post mastectomy, right 01/14/2020   Stroke [facility]) 1989   left sided weakness   TIA (transient ischemic attack)    Tobacco abuse    Vulvar abscess 01/23/2018    Social History   Socioeconomic History   Marital status: Married    Spouse name: Not on file   Number of children: 2   Years of education: Not on file   Highest education level: Not on file  Occupational History   Not on file  Tobacco Use   Smoking status: Every Day    Packs/day: 0.25    Years: 36.00    Total pack years: 9.00    Types: Cigarettes    Start date: 07/11/1981   Smokeless tobacco: Never   Tobacco comments:    1 ppd now  Vaping Use   Vaping Use: Some days  Substance and Sexual Activity   Alcohol use: No   Drug use: No   Sexual activity: Not  Currently  Other Topics Concern   Not on file  Social History Narrative   ** Merged History Encounter **       Lives in [place]   Married but lives with Boyfriend - not legally separated   Disabled - for BiPolar, Seizure disorder, diabetes   Formerly worked at [employer] 1 ppd; no alcohol. 2 step sons; no biologic children.       Social Determinants of Health   Financial Resource Strain: Not on file  Food Insecurity: No Food Insecurity (02/15/2021)   Hunger Vital Sign    Worried About Running Out of Food in the Last Year: Never true    Ran Out of Food in the Last Year: Never true  Transportation Needs: Not on file  Physical Activity: Unknown (09/14/2018)   Exercise Vital Sign    Days of Exercise per Week: Patient refused    Minutes of Exercise per Session: Patient refused  Stress: No Stress Concern Present (09/14/2018)   [employer] [place]    Feeling of Stress : Only a little  Social Connections: Unknown (09/14/2018)   Social Connection and Isolation Panel [NHANES]    Frequency of Communication with Friends and Family: More than three times a week    Frequency of Social Gatherings with Friends and Family: Patient refused    Attends Religious Services: Patient refused    Active Member of Clubs or Organizations: Patient refused    Attends Archivist Meetings: Patient refused    Marital Status: Married  Human resources officer Violence: Unknown (09/14/2018)   Humiliation, Afraid, Rape, and Kick questionnaire    Fear of Current or Ex-Partner: Patient refused    Emotionally Abused: Patient refused    Physically Abused: Patient refused    Sexually Abused: Patient refused    Past Surgical History:  Procedure Laterality Date   AMPUTATION Left 10/11/2021   Procedure: AMPUTATION BELOW KNEE;  Surgeon: Algernon Huxley, MD;  Location: [facility];  Service: General;  Laterality: Left;   AMPUTATION Right 11/15/2021   Procedure:  BELOW KNEE AMPUTATION;  Surgeon: Katha Cabal, MD;  Location: [facility];  Service: Vascular;  Laterality: Right;   APPLICATION OF WOUND VAC Right 01/29/2020   Procedure: APPLICATION  OF WOUND VAC;  Surgeon: Ronny Bacon, MD;  Location: [facility];  Service: General;  Laterality: Right;   APPLICATION OF WOUND VAC Left 11/15/2021   Procedure: APPLICATION OF WOUND VAC;  Surgeon: Katha Cabal, MD;  Location: [facility];  Service: Vascular;  Laterality: Left;   BREAST BIOPSY Right 11/06/2019   [place] core path pending venus clip   GRAFT APPLICATION  2/83/1517   Procedure: GRAFT APPLICATION;  Surgeon: [name], DPM;  Location: [facility];  Service: Podiatry;;   HERNIA REPAIR Left    I & D EXTREMITY Left 11/05/2021   Procedure: IRRIGATION AND DEBRIDEMENT LEFT BELOW KNEE STUMP;  Surgeon: Katha Cabal, MD;  Location: [facility];  Service: Vascular;  Laterality: Left;   INCISION AND DRAINAGE Bilateral 09/21/2021   Procedure: INCISION AND DRAINAGE;  Surgeon: [name], DPM;  Location: [facility];  Service: Podiatry;  Laterality: Bilateral;   INCISION AND DRAINAGE ABSCESS N/A 01/26/2018   Procedure: INCISION AND DEBRIDEMENT OF VULVAR NECROTIZING SOFT TISSUE INFECTION;  Surgeon: Greer Pickerel, MD;  Location: [place];  Service: General;  Laterality: N/A;   INCISION AND DRAINAGE ABSCESS N/A 07/21/2021   Procedure: INCISION AND DRAINAGE ABSCESS Scalp;  Surgeon: Robert Bellow, MD;  Location: [facility];  Service: General;  Laterality: N/A;  scalp and right abdomen   INCISION AND DRAINAGE PERIRECTAL ABSCESS N/A 01/22/2018   Procedure: IRRIGATION AND DEBRIDEMENT LABIAL/VULVAR AREA;  Surgeon: Coralie Keens, MD;  Location: [place];  Service: General;  Laterality: N/A;   INCISION AND DRAINAGE PERIRECTAL ABSCESS N/A 01/29/2018   Procedure: IRRIGATION AND DEBRIDEMENT VULVA;  Surgeon: Excell Seltzer, MD;  Location: [place];  Service: General;  Laterality: N/A;   INGUINAL HERNIA REPAIR Left 04/08/2015    Procedure: OPEN LEFT INGUINAL HERNIA REPAIR WITH MESH;  Surgeon: Ralene Ok, MD;  Location: [place];  Service: General;  Laterality: Left;   INSERTION OF MESH Left 04/08/2015   Procedure: INSERTION OF MESH;  Surgeon: Ralene Ok, MD;  Location: [place];  Service: General;  Laterality: Left;   IRRIGATION AND DEBRIDEMENT ABSCESS N/A 07/21/2021   Procedure: IRRIGATION AND DEBRIDEMENT ABSCESS abdomen;  Surgeon: Robert Bellow, MD;  Location: [facility];  Service: General;  Laterality: N/A;   LAPAROSCOPY     Endometriosis   LEFT HEART CATHETERIZATION WITH CORONARY ANGIOGRAM N/A 12/05/2013   Procedure: LEFT HEART CATHETERIZATION WITH CORONARY ANGIOGRAM;  Surgeon: Jettie Booze, MD;  Location[facility];  Service: Cardiovascular;  Laterality: N/A;   LOWER EXTREMITY ANGIOGRAPHY Left 09/22/2021   Procedure: Lower Extremity Angiography;  Surgeon: Katha Cabal, MD;  Location: [place] [facility];  Service: Cardiovascular;  Laterality: Left;   LOWER EXTREMITY ANGIOGRAPHY Right 11/09/2021   Procedure: Lower Extremity Angiography;  Surgeon: Katha Cabal, MD;  Location: [place] [facility];  Service: Cardiovascular;  Laterality: Right;   MASTECTOMY Right    MINOR APPLICATION OF WOUND VAC Left 11/05/2021   Procedure: MINOR APPLICATION OF WOUND VAC;  Surgeon: Katha Cabal, MD;  Location: [facility];  Service: Vascular;  Laterality: Left;   right kidney drained     SIMPLE MASTECTOMY WITH AXILLARY SENTINEL NODE BIOPSY Right 12/23/2019   Procedure: Right SIMPLE MASTECTOMY WITH AXILLARY SENTINEL NODE BIOPSY;  Surgeon: Ronny Bacon, MD;  Location: [facility];  Service: General;  Laterality: Right;   TEE WITHOUT CARDIOVERSION N/A 11/28/2013   Procedure: TRANSESOPHAGEAL ECHOCARDIOGRAM (TEE);  Surgeon: Thayer Headings, MD;  Location: [place];  Service: Cardiovascular;  Laterality:  N/A;   TEE WITHOUT CARDIOVERSION N/A 10/13/2021   Procedure: TRANSESOPHAGEAL ECHOCARDIOGRAM (TEE);  Surgeon:  Corey Skains, MD;  Location: [facility];  Service: Cardiovascular;  Laterality: N/A;   WOUND DEBRIDEMENT Right 01/29/2020   Procedure: DEBRIDEMENT WOUND, Excisional debridement skin, subcutaneous and muscle right chest wall;  Surgeon: Ronny Bacon, MD;  Location: [facility];  Service: General;  Laterality: Right;    Family History  Problem Relation Age of Onset   Venous thrombosis Brother    Other Brother        BRAIN TUMOR   Asthma Father    Diabetes Father    Coronary artery disease Mother    Hypertension Mother    Diabetes Mother    Breast cancer Mother 25   Asthma Sister    Diabetes type II Brother    Diabetes Brother     Allergies  Allergen Reactions   Adhesive [Tape] Rash and Other (See Comments)    TAKES OFF THE SKIN (CERTAIN MEDICAL TAPES DO THIS!!)   Metoprolol Shortness Of Breath    Occurrence of shortness of breath after 3 days   Montelukast Shortness Of Breath   Morphine Sulfate Anaphylaxis, Shortness Of Breath and Nausea And Vomiting    Swollen Throat - Able to tolerate dilaudid   Penicillins Anaphylaxis, Hives and Shortness Of Breath    Throat swells Has patient had a PCN reaction causing immediate rash, facial/tongue/throat swelling, SOB or lightheadedness with hypotension: Yes Has patient had a PCN reaction causing severe rash involving mucus membranes or skin necrosis: No Has patient had a PCN reaction that required hospitalization: Yes Has patient had a PCN reaction occurring within the last 10 years: No If all of the above answers are "NO", then may proceed with Cephalosporin use.    Silicone Rash    Takes off the skin (certain medical tapes do this)   Diltiazem Swelling   Gabapentin Swelling   Midodrine     Lightheaded and falling down   Percocet [Oxycodone-Acetaminophen] Rash       Latest Ref Rng & Units 02/09/2022    8:52 AM 01/13/2022   11:41 AM 12/14/2021    8:00 AM  CBC  WBC 4.0 - 10.5 K/uL 5.3  5.3  6.7   Hemoglobin 12.0 - 15.0 g/dL 10.1   9.3  8.2   Hematocrit 36.0 - 46.0 % 33.1  30.7  27.5   Platelets 150 - 400 K/uL 167  183  248       CMP     Component Value Date/Time   NA 134 (L) 02/09/2022 0852   NA 137 01/08/2018 1117   K 3.7 02/09/2022 0852   CL 102 02/09/2022 0852   CO2 25 02/09/2022 0852   GLUCOSE 243 (H) 02/09/2022 0852   BUN 19 02/09/2022 0852   BUN 31 (H) 01/08/2018 1117   CREATININE 0.73 02/09/2022 0852   CREATININE 1.59 (H) 07/18/2019 1506   CALCIUM 9.3 02/09/2022 0852   PROT 8.8 (H) 02/09/2022 0852   PROT 7.8 01/08/2018 1117   ALBUMIN 3.1 (L) 02/09/2022 0852   ALBUMIN 4.1 01/08/2018 1117   AST 11 (L) 02/09/2022 0852   ALT 11 02/09/2022 0852   ALKPHOS 126 02/09/2022 0852   BILITOT 0.6 02/09/2022 0852   BILITOT 0.5 01/08/2018 1117   GFRNONAA >60 02/09/2022 0852   GFRAA 51 (L) 02/25/2020 1504     No results found.     Assessment & Plan:   1. Status post below-knee amputation of both lower  extremities ([place]) The patient's right below-knee amputation is healed well and is currently ready for prosthetic.  We will make referral over to [facility] clinic.  The patient's left below-knee amputation has greatly decreased in size in terms of the open wound.  The patient has been doing wet-to-dry dressings.  The patient is advised to transition over to Aquacel dressings changed every other day.  We will have the patient return in 3 weeks to evaluate progress with wound healing.  The patient was seen for further evaluation for right below-knee knee prosthesis.  SHe is a 51 year old female who had amputation on 11/15/2021.  At the time he is well healed and ready for fitting of new right below knee prosthesis.  SHe is a highly motivated individual and should do well once fitted with prosthesis.  She has no problem returning to a K2 ambulator, which will allow her to walk inside and outside of her home and overload level barriers.    Current Outpatient Medications on File Prior to Visit  Medication Sig Dispense  Refill   acetaminophen (TYLENOL) 325 MG tablet Take 2 tablets (650 mg total) by mouth every 4 (four) hours as needed for headache or mild pain.     albuterol (VENTOLIN HFA) 108 (90 Base) MCG/ACT inhaler Inhale 2 puffs into the lungs every 6 (six) hours as needed for wheezing or shortness of breath.     ALPRAZolam (XANAX) 1 MG tablet Take 1 tablet (1 mg total) by mouth 4 (four) times daily as needed for anxiety. 5 tablet 0   aspirin EC 81 MG tablet Take 81 mg by mouth daily before breakfast.     budesonide-formoterol (SYMBICORT) 80-4.5 MCG/ACT inhaler INHALE 2 PUFFS BY MOUTH EVERY 12 HOURS TO PREVENT COUGH OR WHEEZING *RINSE MOUTH AFTER EACH USE* 10.2 g 2   busPIRone (BUSPAR) 30 MG tablet Take 30 mg by mouth 2 (two) times daily.     citalopram (CELEXA) 20 MG tablet Take 1 tablet (20 mg total) by mouth daily. 30 tablet 2   desvenlafaxine (PRISTIQ) 100 MG 24 hr tablet Take 100 mg by mouth daily.     EYLEA 2 MG/0.05ML SOSY      insulin aspart (NOVOLOG) 100 UNIT/ML FlexPen Inject 1-9 Units into the skin 4 (four) times daily -  before meals and at bedtime. Glucose 121 - 150: 1 unit, Glucose 151 - 200: 2 units, Glucose 201 - 250: 3 units, Glucose 251 - 300: 5 units, Glucose 301 - 350: 7 units, Glucose 351 - 400: 9 units, Glucose > 400 call MD 15 mL 11   insulin glargine (LANTUS) 100 UNIT/ML Solostar Pen Inject 35 Units into the skin daily. 15 mL 11   naloxone (NARCAN) nasal spray 4 mg/0.1 mL SMARTSIG:Spray(s) In Nostril     Omadacycline Tosylate (NUZYRA) 150 MG TABS Take 3 tablets by mouth on days 1 and 2, then take 2 tablets by mouth daily thereafter. Take on an empty stomach. 30 tablet 0   ondansetron (ZOFRAN) 8 MG tablet One pill every 8 hours as needed for nausea/vomitting. 40 tablet 1   OXYGEN Inhale 2-4 L into the lungs 2 (two) times daily as needed (shortness of breath).     pregabalin (LYRICA) 300 MG capsule TAKE 1 CAPSULE BY MOUTH TWICE DAILY FOR  NERVE  PAIN 180 capsule 0   rOPINIRole (REQUIP) 1  MG tablet TAKE 1 TABLET BY MOUTH ONCE DAILY AT BEDTIME FOR  RESTLESS  LEG 90 tablet 0   rosuvastatin (CRESTOR) 10  MG tablet Take 1 tablet (10 mg total) by mouth at bedtime. 30 tablet 0   spironolactone (ALDACTONE) 50 MG tablet Take 50 mg by mouth daily.     topiramate (TOPAMAX) 50 MG tablet TAKE 1 TABLET BY MOUTH TWICE DAILY FOR HEADACHE PREVENTION 180 tablet 0   traZODone (DESYREL) 100 MG tablet Take 200 mg by mouth at bedtime.     No current facility-administered medications on file prior to visit.    There are no Patient Instructions on file for this visit. No follow-ups on file.   Kris Hartmann, NP

## 2022-02-14 ENCOUNTER — Encounter (INDEPENDENT_AMBULATORY_CARE_PROVIDER_SITE_OTHER): Payer: Self-pay | Admitting: Vascular Surgery

## 2022-02-14 ENCOUNTER — Ambulatory Visit (INDEPENDENT_AMBULATORY_CARE_PROVIDER_SITE_OTHER): Payer: Medicare Other | Admitting: Vascular Surgery

## 2022-02-14 VITALS — BP 127/73 | HR 73 | Resp 16

## 2022-02-14 DIAGNOSIS — Z89511 Acquired absence of right leg below knee: Secondary | ICD-10-CM | POA: Diagnosis not present

## 2022-02-14 DIAGNOSIS — T8789 Other complications of amputation stump: Secondary | ICD-10-CM | POA: Diagnosis not present

## 2022-02-14 DIAGNOSIS — T8781 Dehiscence of amputation stump: Secondary | ICD-10-CM | POA: Diagnosis not present

## 2022-02-14 DIAGNOSIS — Z89519 Acquired absence of unspecified leg below knee: Secondary | ICD-10-CM

## 2022-02-14 DIAGNOSIS — T8744 Infection of amputation stump, left lower extremity: Secondary | ICD-10-CM | POA: Diagnosis not present

## 2022-02-14 LAB — ESTROGENS, TOTAL: Estrogen: 50 pg/mL

## 2022-02-14 NOTE — Progress Notes (Signed)
Patient ID: [name], female   DOB: 07-29-1970, 51 y.o.   MRN: [id number]   Chief Complaint  Patient presents with   Follow-up    3 week wound check    HPI [name] is a 51 y.o. female.    She notes the right leg is doing well.  Left leg ulcer is also improving.  She is using Maxorb dressing  Past Medical History:  Diagnosis Date   Anxiety    takes Prozac daily   Anxiety    Aortic valve calcification    Asthma    Advair and Spirva daily   Asthma    Bipolar disorder (HCC)    Breast cancer, right breast ([place]) 12/23/2019   CAD (coronary artery disease)    a. LHC 11/2013 done for CP/fluid retention: mild disease in prox LAD, mild-mod disease in mRCA, EF 60% with normal LVEDP. b. Normal nuc 03/2016.   Cancer ([place])    Cellulitis and abscess of trunk 03/12/2020   CHF (congestive heart failure) (HCC)    Chronic diastolic CHF (congestive heart failure) (HCC)    Chronic heart failure with preserved ejection fraction ([place]) 11/16/2018   CKD (chronic kidney disease), stage II    COPD (chronic obstructive pulmonary disease) (HCC)    a. nocturnal O2.   COPD (chronic obstructive pulmonary disease) (HCC)    Coronary artery disease    Decreased urine stream    Diabetes mellitus    Diabetes mellitus without complication (HCC)    Dyspnea    Elevated C-reactive protein (CRP) 01/29/2018   Elevated sedimentation rate 01/29/2018   Elevated serum hCG 01/19/2018   Family history of adverse reaction to anesthesia    mom gets nauseated   Foot pain, bilateral 05/22/2018   GERD (gastroesophageal reflux disease)    takes Pepcid daily   History of blood clots    left leg 3-70yr ago   Hyperlipidemia    Hypertension    Hypertriglyceridemia    Inguinal hernia, left 01/2015   Muscle spasm    Open wound of genital labia    Peripheral neuropathy    RBBB    [place]iz[place]    [place]is [place] 01/19/2018   Sinus tachycardia    a. persistent since 2009.   Sm[place]' cough [place]    Status post  mastectomy, right 01/14/2020   Stroke ([facility] 1989   left sided weakness   TIA (transient ischemic attack)    Tobacco abuse    Vulvar abscess 01/23/2018    Past Surgical History:  Procedure Laterality Date   AMPUTATION Left 10/11/2021   Procedure: AMPUTATION BELOW KNEE;  Surgeon: DAlgernon Huxley MD;  [facility];  Service: General;  Laterality: Left;   AMPUTATION Right 11/15/2021   Procedure: BELOW KNEE AMPUTATION;  Surgeon: SKatha Cabal MD;  Location: [facility];  Service: Vascular;  Laterality: Right;   APPLICATION OF WOUND VAC Right 01/29/2020   Procedure: APPLICATION OF WOUND VAC;  Surgeon: RRonny Bacon MD;  Location: [facility];  Service: General;  Laterality: Right;   APPLICATION OF WOUND VAC Left 11/15/2021   Procedure: APPLICATION OF WOUND VAC;  Surgeon: SKatha Cabal MD;  Location: [facility];  Service: Vascular;  Laterality: Left;   BREAST BIOPSY Right 11/06/2019   u[place]oreacore path pending venus clip   GRAFT APPLICATION  34/[id number]/6812  Procedure: GRAFT APPLICATION;  Surgeon: EEdrick Kins DPM;  Location: [facility];  Service: Podiatry;;   HERNIA REPAIR Left    I &  D EXTREMITY Left 11/05/2021   Procedure: IRRIGATION AND DEBRIDEMENT LEFT BELOW KNEE STUMP;  Surgeon: Katha Cabal, MD;  Location: [facility];  Service: Vascular;  Laterality: Left;   INCISION AND DRAINAGE Bilateral 09/21/2021   Procedure: INCISION AND DRAINAGE;  Surgeon: [name], DPM;  Location: [facility];  Service: Podiatry;  Laterality: Bilateral;   INCISION AND DRAINAGE ABSCESS N/A 01/26/2018   Procedure: INCISION AND DEBRIDEMENT OF VULVAR NECROTIZING SOFT TISSUE INFECTION;  Surgeon: Greer Pickerel, MD;  Location: [place];  Service: General;  Laterality: N/A;   INCISION AND DRAINAGE ABSCESS N/A 07/21/2021   Procedure: INCISION AND DRAINAGE ABSCESS Scalp;  Surgeon: Robert Bellow, MD;  Location: [place] [facility];  Service: General;  Laterality: N/A;  scalp and right abdomen   INCISION AND DRAINAGE PERIRECTAL ABSCESS  N/A 01/22/2018   Procedure: IRRIGATION AND DEBRIDEMENT LABIAL/VULVAR AREA;  Surgeon: Coralie Keens, MD;  Location: [place];  Service: General;  Laterality: N/A;   INCISION AND DRAINAGE PERIRECTAL ABSCESS N/A 01/29/2018   Procedure: IRRIGATION AND DEBRIDEMENT VULVA;  Surgeon: Excell Seltzer, MD;  Location: [place];  Service: General;  Laterality: N/A;   INGUINAL HERNIA REPAIR Left 04/08/2015   Procedure: OPEN LEFT INGUINAL HERNIA REPAIR WITH MESH;  Surgeon: Ralene Ok, MD;  Location: [place];  Service: General;  Laterality: Left;   INSERTION OF MESH Left 04/08/2015   Procedure: INSERTION OF MESH;  Surgeon: Ralene Ok, MD;  Location: [place];  Service: General;  Laterality: Left;   IRRIGATION AND DEBRIDEMENT ABSCESS N/A 07/21/2021   Procedure: IRRIGATION AND DEBRIDEMENT ABSCESS abdomen;  Surgeon: Robert Bellow, MD;  Location: [facility];  Service: General;  Laterality: N/A;   LAPAROSCOPY     Endometriosis   LEFT HEART CATHETERIZATION WITH CORONARY ANGIOGRAM N/A 12/05/2013   Procedure: LEFT HEART CATHETERIZATION WITH CORONARY ANGIOGRAM;  Surgeon: Jettie Booze, MD;  Location: [facility];  Service: Cardiovascular;  Laterality: N/A;   LOWER EXTREMITY ANGIOGRAPHY Left 09/22/2021   Procedure: Lower Extremity Angiography;  Surgeon: Katha Cabal, MD;  Location: [place] CV LAB;  Service: Cardiovascular;  Laterality: Left;   LOWER EXTREMITY ANGIOGRAPHY Right 11/09/2021   Procedure: Lower Extremity Angiography;  Surgeon: Katha Cabal, MD;  Location: [place] [facility] LAB;  Service: Cardiovascular;  Laterality: Right;   MASTECTOMY Right    MINOR APPLICATION OF WOUND VAC Left 11/05/2021   Procedure: MINOR APPLICATION OF WOUND VAC;  Surgeon: Katha Cabal, MD;  Location: [facility];  Service: Vascular;  Laterality: Left;   right kidney drained     SIMPLE MASTECTOMY WITH AXILLARY SENTINEL NODE BIOPSY Right 12/23/2019   Procedure: Right SIMPLE MASTECTOMY WITH AXILLARY SENTINEL NODE  BIOPSY;  Surgeon: Ronny Bacon, MD;  Location: [facility];  Service: General;  Laterality: Right;   TEE WITHOUT CARDIOVERSION N/A 11/28/2013   Procedure: TRANSESOPHAGEAL ECHOCARDIOGRAM (TEE);  Surgeon: Thayer Headings, MD;  Location: [place];  Service: Cardiovascular;  Laterality: N/A;   TEE WITHOUT CARDIOVERSION N/A 10/13/2021   Procedure: TRANSESOPHAGEAL ECHOCARDIOGRAM (TEE);  Surgeon: Corey Skains, MD;  Location: [facility];  Service: Cardiovascular;  Laterality: N/A;   WOUND DEBRIDEMENT Right 01/29/2020   Procedure: DEBRIDEMENT WOUND, Excisional debridement skin, subcutaneous and muscle right chest wall;  Surgeon: Ronny Bacon, MD;  Location: [facility];  Service: General;  Laterality: Right;      Allergies  Allergen Reactions   Adhesive [Tape] Rash and Other (See Comments)    TAKES OFF THE SKIN (CERTAIN MEDICAL TAPES DO THIS!!)  Metoprolol Shortness Of Breath    Occurrence of shortness of breath after 3 days   Montelukast Shortness Of Breath   Morphine Sulfate Anaphylaxis, Shortness Of Breath and Nausea And Vomiting    Swollen Throat - Able to tolerate dilaudid   Penicillins Anaphylaxis, Hives and Shortness Of Breath    Throat swells Has patient had a PCN reaction causing immediate rash, facial/tongue/throat swelling, SOB or lightheadedness with hypotension: Yes Has patient had a PCN reaction causing severe rash involving mucus membranes or skin necrosis: No Has patient had a PCN reaction that required hospitalization: Yes Has patient had a PCN reaction occurring within the last 10 years: No If all of the above answers are "NO", then may proceed with Cephalosporin use.    Silicone Rash    Takes off the skin (certain medical tapes do this)   Diltiazem Swelling   Gabapentin Swelling   Midodrine     Lightheaded and falling down   Percocet [Oxycodone-Acetaminophen] Rash    Current Outpatient Medications  Medication Sig Dispense Refill   acetaminophen (TYLENOL) 325 MG  tablet Take 2 tablets (650 mg total) by mouth every 4 (four) hours as needed for headache or mild pain.     albuterol (VENTOLIN HFA) 108 (90 Base) MCG/ACT inhaler Inhale 2 puffs into the lungs every 6 (six) hours as needed for wheezing or shortness of breath.     ALPRAZolam (XANAX) 1 MG tablet Take 1 tablet (1 mg total) by mouth 4 (four) times daily as needed for anxiety. 5 tablet 0   anastrozole (ARIMIDEX) 1 MG tablet Take 1 tablet (1 mg total) by mouth daily. 30 tablet 0   aspirin EC 81 MG tablet Take 81 mg by mouth daily before breakfast.     budesonide-formoterol (SYMBICORT) 80-4.5 MCG/ACT inhaler INHALE 2 PUFFS BY MOUTH EVERY 12 HOURS TO PREVENT COUGH OR WHEEZING *RINSE MOUTH AFTER EACH USE* 10.2 g 2   busPIRone (BUSPAR) 30 MG tablet Take 30 mg by mouth 2 (two) times daily.     citalopram (CELEXA) 20 MG tablet Take 1 tablet (20 mg total) by mouth daily. 30 tablet 2   desvenlafaxine (PRISTIQ) 100 MG 24 hr tablet Take 100 mg by mouth daily.     EYLEA 2 MG/0.05ML SOSY      insulin aspart (NOVOLOG) 100 UNIT/ML FlexPen Inject 1-9 Units into the skin 4 (four) times daily -  before meals and at bedtime. Glucose 121 - 150: 1 unit, Glucose 151 - 200: 2 units, Glucose 201 - 250: 3 units, Glucose 251 - 300: 5 units, Glucose 301 - 350: 7 units, Glucose 351 - 400: 9 units, Glucose > 400 call MD 15 mL 11   insulin glargine (LANTUS) 100 UNIT/ML Solostar Pen Inject 35 Units into the skin daily. 15 mL 11   naloxone (NARCAN) nasal spray 4 mg/0.1 mL SMARTSIG:Spray(s) In Nostril     Omadacycline Tosylate (NUZYRA) 150 MG TABS Take 3 tablets by mouth on days 1 and 2, then take 2 tablets by mouth daily thereafter. Take on an empty stomach. 30 tablet 0   ondansetron (ZOFRAN) 8 MG tablet One pill every 8 hours as needed for nausea/vomitting. 40 tablet 1   oxyCODONE (OXY IR/ROXICODONE) 5 MG immediate release tablet Take 1 tablet (5 mg total) by mouth every 6 (six) hours as needed for severe pain. 30 tablet 0   OXYGEN  Inhale 2-4 L into the lungs 2 (two) times daily as needed (shortness of breath).     pregabalin (  LYRICA) 300 MG capsule TAKE 1 CAPSULE BY MOUTH TWICE DAILY FOR  NERVE  PAIN 180 capsule 0   rOPINIRole (REQUIP) 1 MG tablet TAKE 1 TABLET BY MOUTH ONCE DAILY AT BEDTIME FOR  RESTLESS  LEG 90 tablet 0   rosuvastatin (CRESTOR) 10 MG tablet Take 1 tablet (10 mg total) by mouth at bedtime. 30 tablet 0   spironolactone (ALDACTONE) 50 MG tablet Take 50 mg by mouth daily.     topiramate (TOPAMAX) 50 MG tablet TAKE 1 TABLET BY MOUTH TWICE DAILY FOR HEADACHE PREVENTION 180 tablet 0   traZODone (DESYREL) 100 MG tablet Take 200 mg by mouth at bedtime.     No current facility-administered medications for this visit.        Physical Exam BP 127/73 (BP Location: Left Arm)   Pulse 73   Resp 16   LMP  (LMP Unknown)  Gen:  WD/WN, NAD Skin: incision C/D/I on the right  with 2+ edema;  the left stump has two open wounds that are superficial and well granulated uninfected     Assessment/Plan: 1. Hx of BKA, unspecified laterality ([place]) Will need a shrinker sock on the right and continue Maxorb on the left Follow up in 4 weeks      [name] 02/14/2022, 10:15 AM   This note was created with Dragon medical transcription system.  Any errors from dictation are unintentional.

## 2022-02-16 DIAGNOSIS — E1122 Type 2 diabetes mellitus with diabetic chronic kidney disease: Secondary | ICD-10-CM | POA: Diagnosis not present

## 2022-02-16 DIAGNOSIS — E1169 Type 2 diabetes mellitus with other specified complication: Secondary | ICD-10-CM | POA: Diagnosis not present

## 2022-02-16 DIAGNOSIS — J449 Chronic obstructive pulmonary disease, unspecified: Secondary | ICD-10-CM | POA: Diagnosis not present

## 2022-02-16 DIAGNOSIS — T8744 Infection of amputation stump, left lower extremity: Secondary | ICD-10-CM | POA: Diagnosis not present

## 2022-02-16 DIAGNOSIS — F319 Bipolar disorder, unspecified: Secondary | ICD-10-CM | POA: Diagnosis not present

## 2022-02-16 DIAGNOSIS — T8789 Other complications of amputation stump: Secondary | ICD-10-CM | POA: Diagnosis not present

## 2022-02-16 DIAGNOSIS — Z4781 Encounter for orthopedic aftercare following surgical amputation: Secondary | ICD-10-CM | POA: Diagnosis not present

## 2022-02-16 DIAGNOSIS — E1151 Type 2 diabetes mellitus with diabetic peripheral angiopathy without gangrene: Secondary | ICD-10-CM | POA: Diagnosis not present

## 2022-02-16 DIAGNOSIS — I872 Venous insufficiency (chronic) (peripheral): Secondary | ICD-10-CM | POA: Diagnosis not present

## 2022-02-16 DIAGNOSIS — F419 Anxiety disorder, unspecified: Secondary | ICD-10-CM | POA: Diagnosis not present

## 2022-02-16 DIAGNOSIS — I130 Hypertensive heart and chronic kidney disease with heart failure and stage 1 through stage 4 chronic kidney disease, or unspecified chronic kidney disease: Secondary | ICD-10-CM | POA: Diagnosis not present

## 2022-02-16 DIAGNOSIS — N1831 Chronic kidney disease, stage 3a: Secondary | ICD-10-CM | POA: Diagnosis not present

## 2022-02-16 DIAGNOSIS — I5032 Chronic diastolic (congestive) heart failure: Secondary | ICD-10-CM | POA: Diagnosis not present

## 2022-02-16 DIAGNOSIS — E782 Mixed hyperlipidemia: Secondary | ICD-10-CM | POA: Diagnosis not present

## 2022-02-17 DIAGNOSIS — T8789 Other complications of amputation stump: Secondary | ICD-10-CM | POA: Diagnosis not present

## 2022-02-21 DIAGNOSIS — Z89511 Acquired absence of right leg below knee: Secondary | ICD-10-CM | POA: Diagnosis not present

## 2022-02-21 DIAGNOSIS — T8781 Dehiscence of amputation stump: Secondary | ICD-10-CM | POA: Diagnosis not present

## 2022-02-22 ENCOUNTER — Telehealth (INDEPENDENT_AMBULATORY_CARE_PROVIDER_SITE_OTHER): Payer: Self-pay

## 2022-02-22 ENCOUNTER — Other Ambulatory Visit: Payer: Self-pay | Admitting: Primary Care

## 2022-02-22 DIAGNOSIS — G43709 Chronic migraine without aura, not intractable, without status migrainosus: Secondary | ICD-10-CM

## 2022-02-22 NOTE — Telephone Encounter (Signed)
Patient left a message checking to see if Dr Delana Meyer has spoken with Gerald Stabs pertaining for the right shrinker socket. I left a message on patient voicemail that I will speak with dr Delana Meyer when returns back in the office on Thursday.

## 2022-02-24 ENCOUNTER — Telehealth: Payer: Self-pay | Admitting: Primary Care

## 2022-02-24 NOTE — Telephone Encounter (Signed)
Left message for patient to call back and schedule Medicare Annual Wellness Visit (AWV).   Please offer to do virtually or by telephone.   Last AWV:  01/09/2011 Please schedule at anytime with [facility] Advisor schedule   45 minute appointent  If any questions, please contact me at [phone]

## 2022-02-25 DIAGNOSIS — E1151 Type 2 diabetes mellitus with diabetic peripheral angiopathy without gangrene: Secondary | ICD-10-CM | POA: Diagnosis not present

## 2022-02-25 DIAGNOSIS — J449 Chronic obstructive pulmonary disease, unspecified: Secondary | ICD-10-CM | POA: Diagnosis not present

## 2022-02-25 DIAGNOSIS — I5032 Chronic diastolic (congestive) heart failure: Secondary | ICD-10-CM | POA: Diagnosis not present

## 2022-02-25 DIAGNOSIS — F419 Anxiety disorder, unspecified: Secondary | ICD-10-CM | POA: Diagnosis not present

## 2022-02-25 DIAGNOSIS — Z4781 Encounter for orthopedic aftercare following surgical amputation: Secondary | ICD-10-CM | POA: Diagnosis not present

## 2022-02-25 DIAGNOSIS — E1169 Type 2 diabetes mellitus with other specified complication: Secondary | ICD-10-CM | POA: Diagnosis not present

## 2022-02-25 DIAGNOSIS — T8744 Infection of amputation stump, left lower extremity: Secondary | ICD-10-CM | POA: Diagnosis not present

## 2022-02-25 DIAGNOSIS — E1122 Type 2 diabetes mellitus with diabetic chronic kidney disease: Secondary | ICD-10-CM | POA: Diagnosis not present

## 2022-02-25 DIAGNOSIS — F319 Bipolar disorder, unspecified: Secondary | ICD-10-CM | POA: Diagnosis not present

## 2022-02-25 DIAGNOSIS — I872 Venous insufficiency (chronic) (peripheral): Secondary | ICD-10-CM | POA: Diagnosis not present

## 2022-02-25 DIAGNOSIS — E782 Mixed hyperlipidemia: Secondary | ICD-10-CM | POA: Diagnosis not present

## 2022-02-25 DIAGNOSIS — N1831 Chronic kidney disease, stage 3a: Secondary | ICD-10-CM | POA: Diagnosis not present

## 2022-02-25 DIAGNOSIS — I130 Hypertensive heart and chronic kidney disease with heart failure and stage 1 through stage 4 chronic kidney disease, or unspecified chronic kidney disease: Secondary | ICD-10-CM | POA: Diagnosis not present

## 2022-03-01 ENCOUNTER — Telehealth: Payer: Self-pay | Admitting: *Deleted

## 2022-03-01 NOTE — Telephone Encounter (Signed)
Left vm for patient to change her telephone call apt from next week 9/1 to this week - either wed. 8/23 or Thur. 8/24

## 2022-03-02 ENCOUNTER — Telehealth: Payer: Self-pay | Admitting: Primary Care

## 2022-03-02 DIAGNOSIS — Z4781 Encounter for orthopedic aftercare following surgical amputation: Secondary | ICD-10-CM | POA: Diagnosis not present

## 2022-03-02 DIAGNOSIS — E1151 Type 2 diabetes mellitus with diabetic peripheral angiopathy without gangrene: Secondary | ICD-10-CM | POA: Diagnosis not present

## 2022-03-02 DIAGNOSIS — T8744 Infection of amputation stump, left lower extremity: Secondary | ICD-10-CM | POA: Diagnosis not present

## 2022-03-02 DIAGNOSIS — F419 Anxiety disorder, unspecified: Secondary | ICD-10-CM | POA: Diagnosis not present

## 2022-03-02 DIAGNOSIS — J449 Chronic obstructive pulmonary disease, unspecified: Secondary | ICD-10-CM | POA: Diagnosis not present

## 2022-03-02 DIAGNOSIS — N1831 Chronic kidney disease, stage 3a: Secondary | ICD-10-CM | POA: Diagnosis not present

## 2022-03-02 DIAGNOSIS — I5032 Chronic diastolic (congestive) heart failure: Secondary | ICD-10-CM | POA: Diagnosis not present

## 2022-03-02 DIAGNOSIS — I130 Hypertensive heart and chronic kidney disease with heart failure and stage 1 through stage 4 chronic kidney disease, or unspecified chronic kidney disease: Secondary | ICD-10-CM | POA: Diagnosis not present

## 2022-03-02 DIAGNOSIS — E782 Mixed hyperlipidemia: Secondary | ICD-10-CM | POA: Diagnosis not present

## 2022-03-02 DIAGNOSIS — I872 Venous insufficiency (chronic) (peripheral): Secondary | ICD-10-CM | POA: Diagnosis not present

## 2022-03-02 DIAGNOSIS — F319 Bipolar disorder, unspecified: Secondary | ICD-10-CM | POA: Diagnosis not present

## 2022-03-02 DIAGNOSIS — E1122 Type 2 diabetes mellitus with diabetic chronic kidney disease: Secondary | ICD-10-CM | POA: Diagnosis not present

## 2022-03-02 DIAGNOSIS — E1169 Type 2 diabetes mellitus with other specified complication: Secondary | ICD-10-CM | POA: Diagnosis not present

## 2022-03-02 NOTE — Telephone Encounter (Signed)
Looks like patient has appointment but not until 8/25

## 2022-03-02 NOTE — Telephone Encounter (Signed)
Noted, will evaluate as scheduled.  

## 2022-03-02 NOTE — Telephone Encounter (Signed)
Christie Nottingham from [facility] called and stated that patient not doing well and has a rash on her left arm. Call back number [phone].  Also stated she will have patient call back and make an appointment.

## 2022-03-04 ENCOUNTER — Ambulatory Visit (INDEPENDENT_AMBULATORY_CARE_PROVIDER_SITE_OTHER): Payer: BC Managed Care – PPO | Admitting: Primary Care

## 2022-03-04 ENCOUNTER — Ambulatory Visit: Payer: BC Managed Care – PPO | Admitting: Nurse Practitioner

## 2022-03-04 ENCOUNTER — Encounter: Payer: Self-pay | Admitting: Primary Care

## 2022-03-04 VITALS — BP 118/60 | HR 71 | Temp 98.6°F | Resp 16

## 2022-03-04 DIAGNOSIS — E785 Hyperlipidemia, unspecified: Secondary | ICD-10-CM | POA: Diagnosis not present

## 2022-03-04 DIAGNOSIS — Z794 Long term (current) use of insulin: Secondary | ICD-10-CM

## 2022-03-04 DIAGNOSIS — I5032 Chronic diastolic (congestive) heart failure: Secondary | ICD-10-CM | POA: Diagnosis not present

## 2022-03-04 DIAGNOSIS — R21 Rash and other nonspecific skin eruption: Secondary | ICD-10-CM | POA: Diagnosis not present

## 2022-03-04 DIAGNOSIS — F419 Anxiety disorder, unspecified: Secondary | ICD-10-CM | POA: Diagnosis not present

## 2022-03-04 DIAGNOSIS — T8744 Infection of amputation stump, left lower extremity: Secondary | ICD-10-CM | POA: Diagnosis not present

## 2022-03-04 DIAGNOSIS — I872 Venous insufficiency (chronic) (peripheral): Secondary | ICD-10-CM | POA: Diagnosis not present

## 2022-03-04 DIAGNOSIS — E782 Mixed hyperlipidemia: Secondary | ICD-10-CM | POA: Diagnosis not present

## 2022-03-04 DIAGNOSIS — E1122 Type 2 diabetes mellitus with diabetic chronic kidney disease: Secondary | ICD-10-CM | POA: Diagnosis not present

## 2022-03-04 DIAGNOSIS — E1151 Type 2 diabetes mellitus with diabetic peripheral angiopathy without gangrene: Secondary | ICD-10-CM | POA: Diagnosis not present

## 2022-03-04 DIAGNOSIS — N1831 Chronic kidney disease, stage 3a: Secondary | ICD-10-CM | POA: Diagnosis not present

## 2022-03-04 DIAGNOSIS — Z4781 Encounter for orthopedic aftercare following surgical amputation: Secondary | ICD-10-CM | POA: Diagnosis not present

## 2022-03-04 DIAGNOSIS — F319 Bipolar disorder, unspecified: Secondary | ICD-10-CM | POA: Diagnosis not present

## 2022-03-04 DIAGNOSIS — I2510 Atherosclerotic heart disease of native coronary artery without angina pectoris: Secondary | ICD-10-CM

## 2022-03-04 DIAGNOSIS — E1165 Type 2 diabetes mellitus with hyperglycemia: Secondary | ICD-10-CM | POA: Diagnosis not present

## 2022-03-04 DIAGNOSIS — E1169 Type 2 diabetes mellitus with other specified complication: Secondary | ICD-10-CM | POA: Diagnosis not present

## 2022-03-04 DIAGNOSIS — J449 Chronic obstructive pulmonary disease, unspecified: Secondary | ICD-10-CM | POA: Diagnosis not present

## 2022-03-04 DIAGNOSIS — I130 Hypertensive heart and chronic kidney disease with heart failure and stage 1 through stage 4 chronic kidney disease, or unspecified chronic kidney disease: Secondary | ICD-10-CM | POA: Diagnosis not present

## 2022-03-04 LAB — POCT GLYCOSYLATED HEMOGLOBIN (HGB A1C): Hemoglobin A1C: 10.3 % — AB (ref 4.0–5.6)

## 2022-03-04 MED ORDER — INSULIN GLARGINE 100 UNIT/ML SOLOSTAR PEN: 24.0000 [IU] | PEN_INJECTOR | Freq: Every day | SUBCUTANEOUS | 11 refills | Status: DC

## 2022-03-04 MED ORDER — INSULIN ASPART 100 UNIT/ML FLEXPEN: PEN_INJECTOR | SUBCUTANEOUS | 11 refills | Status: DC

## 2022-03-04 NOTE — Progress Notes (Signed)
Subjective:    Patient ID: [name], female    DOB: [birth year], 51 y.o.   MRN: [id number]  Rash Pertinent negatives include no shortness of breath.    [name] is a very pleasant 51 y.o. female with a significant medical history including CHF, CAD, PVD, uncontrolled type 2 diabetes, osteomyelitis with bilateral BKA, breast cancer, chronic migraines, bipolar disorder, COPD, chronic respiratory failure who presents today to discuss rash and follow up of diabetes.  Her husband joins [place] today.   1) Rash: Acute to the bilateral upper extremities and palmer hands which she first noticed 3 days ago. She did switch to [place] soap one week prior to her rash onset.   No new lotions, detergents, or shampoos. No new medicines, vitamins, supplements. No new pets. No recent outdoor exposure or poison ivy exposure. No bonfire or smoke exposure.  No recent motel or hotel stay or new beds.   No fevers/chills, oral lesions, new joint pains, tick bites, abdominal pain, nausea.   She's not taken or applied anything for her rash. Today her rash appears better.  2) Type 2 Diabetes: Previously following with endocrinology through [facility] clinic, no visit in over 1 year as her medical provider left the practice.  Current medications include: Lantus 24 units HS and Novolog 24 units in the morning.  She is checking her blood glucose 2 times daily and is getting readings of:  AM fasting: high 100's to low 200's Bedtime: 250's  Once weekly she will drop in the 90's.   Her highest reading recently was 346, this morning.   Last A1C: 10.3 today Last Eye Exam: Due Last Foot Exam: N/A Pneumonia Vaccination: 2017 Urine Microalbumin: Due and pending. Statin: Rosuvastatin  She mostly eats chicken patties and biscuits for breakfast, baked lean protein and veggies for dinner.  Sometimes will eat peanut butter crackers for dinner She is eating 1-2 meals daily.   Exercise: None.    Review of  Systems  Eyes:  Negative for visual disturbance.  Respiratory:  Negative for shortness of breath.   Cardiovascular:  Negative for chest pain.  Skin:  Positive for rash.  Neurological:  Negative for dizziness and headaches.         Past Medical History:  Diagnosis Date   Anxiety    takes Prozac daily   Anxiety    Aortic valve calcification    Asthma    Advair and Spirva daily   Asthma    Bipolar disorder (HCC)    Breast cancer, right breast ([employer]) 12/23/2019   CAD (coronary artery disease)    a. LHC 11/2013 done for CP/fluid retention: mild disease in prox LAD, mild-mod disease in mRCA, EF 60% with normal LVEDP. b. Normal nuc 03/2016.   Cancer ([place])    Cellulitis and abscess of trunk 03/12/2020   CHF (congestive heart failure) (HCC)    Chronic diastolic CHF (congestive heart failure) (HCC)    Chronic heart failure with preserved ejection fraction ([place]) 11/16/2018   CKD (chronic kidney disease), stage II    COPD (chronic obstructive pulmonary disease) (HCC)    a. nocturnal O2.   COPD (chronic obstructive pulmonary disease) (HCC)    Coronary artery disease    Decreased urine stream    Diabetes mellitus    Diabetes mellitus without complication (HCC)    Dyspnea    Elevated C-reactive protein (CRP) 01/29/2018   Elevated sedimentation rate 01/29/2018   Elevated serum hCG 01/19/2018   Family history of adverse reaction  to anesthesia    mom gets nauseated   Foot pain, bilateral 05/22/2018   GERD (gastroesophageal reflux disease)    takes Pepcid daily   History of blood clots    left leg 3-28yr ago   Hyperlipidemia    Hypertension    Hypertriglyceridemia    Inguinal hernia, left 01/2015   Muscle spasm    Open wound of genital labia    Peripheral neuropathy    Rash and nonspecific skin eruption consistent with small vessel vasculitis, Ddx includes embolic cause  55/10/6501  RBBB    Seizures ([facility]    Sepsis ([facility] 01/19/2018   Sinus tachycardia    a. persistent since 2009.    Smokers' cough ([facility]    Status post mastectomy, right 01/14/2020   Stroke ([facility] 1989   left sided weakness   TIA (transient ischemic attack)    Tobacco abuse    Vulvar abscess 01/23/2018    Social History   Socioeconomic History   Marital status: Married    Spouse name: Not on file   Number of children: 2   Years of education: Not on file   Highest education level: Not on file  Occupational History   Not on file  Tobacco Use   Smoking status: Every Day    Packs/day: 0.25    Years: 36.00    Total pack years: 9.00    Types: Cigarettes    Start date: 07/11/1981   Smokeless tobacco: Never   Tobacco comments:    1 ppd now  Vaping Use   Vaping Use: Some days  Substance and Sexual Activity   Alcohol use: No   Drug use: No   Sexual activity: Not Currently  Other Topics Concern   Not on file  Social History Narrative   ** Merged History Encounter **       [place]  Married but lives with Boyfriend - not legally separated   Disabled - for BiPolar, Seizure disorder, diabetes   Formerly worked at [employer] ppd; no alcohol. 2 step sons; no biologic children.       Social Determinants of Health   Financial Resource Strain: Not on file  Food Insecurity: No Food Insecurity (02/15/2021)   Hunger Vital Sign    Worried About Running Out of Food in the Last Year: Never true    Ran Out of Food in the Last Year: Never true  Transportation Needs: Not on file  Physical Activity: Unknown (09/14/2018)   Exercise Vital Sign    Days of Exercise per Week: Patient refused    Minutes of Exercise per Session: Patient refused  Stress: No Stress Concern Present (09/14/2018)   [employer] [place]   Feeling of Stress : Only a little  Social Connections: Unknown (09/14/2018)   Social Connection and Isolation Panel [NHANES]    Frequency of Communication with Friends and Family: More than three times a week    Frequency of  Social Gatherings with Friends and Family: Patient refused    Attends Religious Services: Patient refused    Active Member of Clubs or Organizations: Patient refused    Attends CArchivistMeetings: Patient refused    Marital Status: Married  IHuman resources officerViolence: Unknown (09/14/2018)   Humiliation, Afraid, Rape, and Kick questionnaire    Fear of Current or Ex-Partner: Patient refused    Emotionally Abused: Patient refused    Physically Abused:  Patient refused    Sexually Abused: Patient refused    Past Surgical History:  Procedure Laterality Date   AMPUTATION Left 10/11/2021   Procedure: AMPUTATION BELOW KNEE;  Surgeon: Algernon Huxley, MD;  Location: [place] [facility];  Service: General;  Laterality: Left;   AMPUTATION Right 11/15/2021   Procedure: BELOW KNEE AMPUTATION;  Surgeon: Katha Cabal, MD;  Location: [facility];  Service: Vascular;  Laterality: Right;   APPLICATION OF WOUND VAC Right 01/29/2020   Procedure: APPLICATION OF WOUND VAC;  Surgeon: Ronny Bacon, MD;  Location: [facility];  Service: General;  Laterality: Right;   APPLICATION OF WOUND VAC Left 11/15/2021   Procedure: APPLICATION OF WOUND VAC;  Surgeon: Katha Cabal, MD;  Location: [facility];  Service: Vascular;  Laterality: Left;   BREAST BIOPSY Right 11/06/2019   [place] core path pending venus clip   GRAFT APPLICATION  7/34/1937   Procedure: GRAFT APPLICATION;  Surgeon: [name], DPM;  Location: [facility];  Service: Podiatry;;   HERNIA REPAIR Left    I & D EXTREMITY Left 11/05/2021   Procedure: IRRIGATION AND DEBRIDEMENT LEFT BELOW KNEE STUMP;  Surgeon: Katha Cabal, MD;  Location: [facility];  Service: Vascular;  Laterality: Left;   INCISION AND DRAINAGE Bilateral 09/21/2021   Procedure: INCISION AND DRAINAGE;  Surgeon: [name], DPM;  Location: [facility];  Service: Podiatry;  Laterality: Bilateral;   INCISION AND DRAINAGE ABSCESS N/A 01/26/2018   Procedure: INCISION AND DEBRIDEMENT OF VULVAR  NECROTIZING SOFT TISSUE INFECTION;  Surgeon: Greer Pickerel, MD;  Location: [place];  Service: General;  Laterality: N/A;   INCISION AND DRAINAGE ABSCESS N/A 07/21/2021   Procedure: INCISION AND DRAINAGE ABSCESS Scalp;  Surgeon: Robert Bellow, MD;  Location: [place] [facility];  Service: General;  Laterality: N/A;  scalp and right abdomen   INCISION AND DRAINAGE PERIRECTAL ABSCESS N/A 01/22/2018   Procedure: IRRIGATION AND DEBRIDEMENT LABIAL/VULVAR AREA;  Surgeon: Coralie Keens, MD;  Location: [place];  Service: General;  Laterality: N/A;   INCISION AND DRAINAGE PERIRECTAL ABSCESS N/A 01/29/2018   Procedure: IRRIGATION AND DEBRIDEMENT VULVA;  Surgeon: Excell Seltzer, MD;  Location: [place];  Service: General;  Laterality: N/A;   INGUINAL HERNIA REPAIR Left 04/08/2015   Procedure: OPEN LEFT INGUINAL HERNIA REPAIR WITH MESH;  Surgeon: Ralene Ok, MD;  Location: [place];  Service: General;  Laterality: Left;   INSERTION OF MESH Left 04/08/2015   Procedure: INSERTION OF MESH;  Surgeon: Ralene Ok, MD;  Location: [place];  Service: General;  Laterality: Left;   IRRIGATION AND DEBRIDEMENT ABSCESS N/A 07/21/2021   Procedure: IRRIGATION AND DEBRIDEMENT ABSCESS abdomen;  Surgeon: Robert Bellow, MD;  Location: [facility];  Service: General;  Laterality: N/A;   LAPAROSCOPY     Endometriosis   LEFT HEART CATHETERIZATION WITH CORONARY ANGIOGRAM N/A 12/05/2013   Procedure: LEFT HEART CATHETERIZATION WITH CORONARY ANGIOGRAM;  Surgeon: Jettie Booze, MD;  [facility];  Service: Cardiovascular;  Laterality: N/A;   LOWER EXTREMITY ANGIOGRAPHY Left 09/22/2021   Procedure: Lower Extremity Angiography;  Surgeon: Katha Cabal, MD;  Location: [place] CV [facility];  Service: Cardiovascular;  Laterality: Left;   LOWER EXTREMITY ANGIOGRAPHY Right 11/09/2021   Procedure: Lower Extremity Angiography;  Surgeon: Katha Cabal, MD;  Location: [place] [facility];  Service: Cardiovascular;  Laterality:  Right;   MASTECTOMY Right    MINOR APPLICATION OF WOUND VAC Left 11/05/2021   Procedure: MINOR APPLICATION OF WOUND VAC;  Surgeon:  Schnier, Dolores Lory, MD;  Location: [facility];  Service: Vascular;  Laterality: Left;   right kidney drained     SIMPLE MASTECTOMY WITH AXILLARY SENTINEL NODE BIOPSY Right 12/23/2019   Procedure: Right SIMPLE MASTECTOMY WITH AXILLARY SENTINEL NODE BIOPSY;  Surgeon: Ronny Bacon, MD;  Location: [facility];  Service: General;  Laterality: Right;   TEE WITHOUT CARDIOVERSION N/A 11/28/2013   Procedure: TRANSESOPHAGEAL ECHOCARDIOGRAM (TEE);  Surgeon: Thayer Headings, MD;  Location: [place];  Service: Cardiovascular;  Laterality: N/A;   TEE WITHOUT CARDIOVERSION N/A 10/13/2021   Procedure: TRANSESOPHAGEAL ECHOCARDIOGRAM (TEE);  Surgeon: Corey Skains, MD;  Location: [facility];  Service: Cardiovascular;  Laterality: N/A;   WOUND DEBRIDEMENT Right 01/29/2020   Procedure: DEBRIDEMENT WOUND, Excisional debridement skin, subcutaneous and muscle right chest wall;  Surgeon: Ronny Bacon, MD;  Location: [facility];  Service: General;  Laterality: Right;    Family History  Problem Relation Age of Onset   Venous thrombosis Brother    Other Brother        BRAIN TUMOR   Asthma Father    Diabetes Father    Coronary artery disease Mother    Hypertension Mother    Diabetes Mother    Breast cancer Mother 47   Asthma Sister    Diabetes type II Brother    Diabetes Brother     Allergies  Allergen Reactions   Adhesive [Tape] Rash and Other (See Comments)    TAKES OFF THE SKIN (CERTAIN MEDICAL TAPES DO THIS!!)   Metoprolol Shortness Of Breath    Occurrence of shortness of breath after 3 days   Montelukast Shortness Of Breath   Morphine Sulfate Anaphylaxis, Shortness Of Breath and Nausea And Vomiting    Swollen Throat - Able to tolerate dilaudid   Penicillins Anaphylaxis, Hives and Shortness Of Breath    Throat swells Has patient had a PCN reaction causing immediate  rash, facial/tongue/throat swelling, SOB or lightheadedness with hypotension: Yes Has patient had a PCN reaction causing severe rash involving mucus membranes or skin necrosis: No Has patient had a PCN reaction that required hospitalization: Yes Has patient had a PCN reaction occurring within the last 10 years: No If all of the above answers are "NO", then may proceed with Cephalosporin use.    Silicone Rash    Takes off the skin (certain medical tapes do this)   Diltiazem Swelling   Gabapentin Swelling   Midodrine     Lightheaded and falling down   Percocet [Oxycodone-Acetaminophen] Rash    Current Outpatient Medications on File Prior to Visit  Medication Sig Dispense Refill   acetaminophen (TYLENOL) 325 MG tablet Take 2 tablets (650 mg total) by mouth every 4 (four) hours as needed for headache or mild pain.     albuterol (VENTOLIN HFA) 108 (90 Base) MCG/ACT inhaler Inhale 2 puffs into the lungs every 6 (six) hours as needed for wheezing or shortness of breath.     ALPRAZolam (XANAX) 1 MG tablet Take 1 tablet (1 mg total) by mouth 4 (four) times daily as needed for anxiety. 5 tablet 0   anastrozole (ARIMIDEX) 1 MG tablet Take 1 tablet (1 mg total) by mouth daily. 30 tablet 0   aspirin EC 81 MG tablet Take 81 mg by mouth daily before breakfast.     budesonide-formoterol (SYMBICORT) 80-4.5 MCG/ACT inhaler INHALE 2 PUFFS BY MOUTH EVERY 12 HOURS TO PREVENT COUGH OR WHEEZING *RINSE MOUTH AFTER EACH USE* 10.2 g 2   busPIRone (BUSPAR) 30 MG  tablet Take 30 mg by mouth 2 (two) times daily.     citalopram (CELEXA) 20 MG tablet Take 1 tablet (20 mg total) by mouth daily. 30 tablet 2   desvenlafaxine (PRISTIQ) 100 MG 24 hr tablet Take 100 mg by mouth daily.     EYLEA 2 MG/0.05ML SOSY      naloxone (NARCAN) nasal spray 4 mg/0.1 mL SMARTSIG:Spray(s) In Nostril     Omadacycline Tosylate (NUZYRA) 150 MG TABS Take 3 tablets by mouth on days 1 and 2, then take 2 tablets by mouth daily thereafter. Take on  an empty stomach. 30 tablet 0   ondansetron (ZOFRAN) 8 MG tablet One pill every 8 hours as needed for nausea/vomitting. 40 tablet 1   oxyCODONE (OXY IR/ROXICODONE) 5 MG immediate release tablet Take 1 tablet (5 mg total) by mouth every 6 (six) hours as needed for severe pain. 30 tablet 0   OXYGEN Inhale 2-4 L into the lungs 2 (two) times daily as needed (shortness of breath).     pregabalin (LYRICA) 300 MG capsule TAKE 1 CAPSULE BY MOUTH TWICE DAILY FOR  NERVE  PAIN 180 capsule 0   rOPINIRole (REQUIP) 1 MG tablet TAKE 1 TABLET BY MOUTH ONCE DAILY AT BEDTIME FOR  RESTLESS  LEG 90 tablet 0   rosuvastatin (CRESTOR) 10 MG tablet Take 1 tablet (10 mg total) by mouth at bedtime. 30 tablet 0   spironolactone (ALDACTONE) 50 MG tablet Take 50 mg by mouth daily.     topiramate (TOPAMAX) 50 MG tablet TAKE 1 TABLET BY MOUTH TWICE DAILY FOR HEADACHE PREVENTION 180 tablet 1   traZODone (DESYREL) 100 MG tablet Take 200 mg by mouth at bedtime.     No current facility-administered medications on file prior to visit.    BP 118/60   Pulse 71   Temp 98.6 F (37 C)   Resp 16   LMP  (LMP Unknown)   SpO2 96%  Objective:   Physical Exam Cardiovascular:     Rate and Rhythm: Normal rate and regular rhythm.  Pulmonary:     Effort: Pulmonary effort is normal.     Breath sounds: Normal breath sounds.  Skin:    General: Skin is warm and dry.     Findings: Rash present.     Comments: Redness to bilateral medial palmer hands.  Scattered light pink rash to bilateral medial upper extremities.   Neurological:     Mental Status: She is alert and oriented to person, place, and time.           Assessment & Plan:   Problem List Items Addressed This Visit       Endocrine   Uncontrolled type 2 diabetes mellitus with hyperglycemia, with long-term current use of insulin ([place]) - Primary    Uncontrolled with A1c of 10.3 today.  She is also not following instructions for prescribed insulin regimen. She does  not wish to return to endocrinology, no visit in over 1 year.  We also had a frank discussion about her diet, recommended she limit carbs when possible.  Continue Lantus 24 units at bedtime. Increase NovoLog to 28 units before breakfast, add NovoLog 10 units before dinner. Discussed to notify me via MyChart if glucose readings remain at or above 150 after a few weeks.  Urine microalbumin due and pending today.  Follow-up in 3 months.      Relevant Medications   insulin aspart (NOVOLOG) 100 UNIT/ML FlexPen   insulin glargine (LANTUS) 100 UNIT/ML Solostar  Pen   Other Relevant Orders   POCT glycosylated hemoglobin (Hb A1C) (Completed)   Microalbumin / creatinine urine ratio   Hyperlipidemia associated with type 2 diabetes mellitus (HCC)   Relevant Medications   insulin aspart (NOVOLOG) 100 UNIT/ML FlexPen   insulin glargine (LANTUS) 100 UNIT/ML Solostar Pen   Other Relevant Orders   Lipid panel     Musculoskeletal and Integument   Rash and nonspecific skin eruption consistent with small vessel vasculitis, Ddx includes embolic cause     Unclear cause, but her soaps did change right before symptom onset.  Discussed to switch back to [employer]. As rash is improving, we will defer treatment with oral steroids, especially in light of her A1c of 10.3. Her rash is not bothersome.  I will have him update early next week via MyChart.          Pleas Koch, NP

## 2022-03-04 NOTE — Assessment & Plan Note (Signed)
Uncontrolled with A1c of 10.3 today.  She is also not following instructions for prescribed insulin regimen. She does not wish to return to endocrinology, no visit in over 1 year.  We also had a frank discussion about her diet, recommended she limit carbs when possible.  Continue Lantus 24 units at bedtime. Increase NovoLog to 28 units before breakfast, add NovoLog 10 units before dinner. Discussed to notify me via MyChart if glucose readings remain at or above 150 after a few weeks.  Urine microalbumin due and pending today.  Follow-up in 3 months.

## 2022-03-04 NOTE — Patient Instructions (Addendum)
Switch back to [employer]. Stop using [place] soap.  Update me on Monday regarding your rash.  Increase Novolog (meal time insulin) to 28 units in the morning before breakfast. Add Novolog (meal time insulin) 10 units in the evening before dinner.  Continue Lantus 24 units at bedtime.   Notify me if your sugars are running >150 consistently after 2 weeks.  Please schedule a follow up visit for 3 months for diabetes check.  It was a pleasure to see you today!

## 2022-03-04 NOTE — Assessment & Plan Note (Signed)
Unclear cause, but her soaps did change right before symptom onset.  Discussed to switch back to [employer]. As rash is improving, we will defer treatment with oral steroids, especially in light of her A1c of 10.3. Her rash is not bothersome.  I will have him update early next week via MyChart.

## 2022-03-05 LAB — LIPID PANEL
Cholesterol: 130 mg/dL (ref <200)
HDL: 32 mg/dL — ABNORMAL LOW (ref >=50)
LDL Cholesterol (Calc): 84 mg/dL (calc)
Non-HDL Cholesterol (Calc): 98 mg/dL (calc) (ref <130)
Total CHOL/HDL Ratio: 4.1 (calc) (ref <5.0)
Triglycerides: 65 mg/dL (ref <150)

## 2022-03-07 ENCOUNTER — Other Ambulatory Visit: Payer: BC Managed Care – PPO

## 2022-03-07 DIAGNOSIS — Z794 Long term (current) use of insulin: Secondary | ICD-10-CM | POA: Diagnosis not present

## 2022-03-07 DIAGNOSIS — E1165 Type 2 diabetes mellitus with hyperglycemia: Secondary | ICD-10-CM | POA: Diagnosis not present

## 2022-03-07 LAB — MICROALBUMIN / CREATININE URINE RATIO
Creatinine,U: 37.6 mg/dL
Microalb Creat Ratio: 452.4 mg/g — ABNORMAL HIGH (ref 0.0–30.0)
Microalb, Ur: 170.3 mg/dL — ABNORMAL HIGH (ref 0.0–1.9)

## 2022-03-08 ENCOUNTER — Other Ambulatory Visit: Payer: Self-pay | Admitting: Primary Care

## 2022-03-08 ENCOUNTER — Ambulatory Visit: Payer: BC Managed Care – PPO | Admitting: Infectious Diseases

## 2022-03-08 DIAGNOSIS — E1169 Type 2 diabetes mellitus with other specified complication: Secondary | ICD-10-CM | POA: Diagnosis not present

## 2022-03-08 DIAGNOSIS — E1165 Type 2 diabetes mellitus with hyperglycemia: Secondary | ICD-10-CM

## 2022-03-08 DIAGNOSIS — Z4781 Encounter for orthopedic aftercare following surgical amputation: Secondary | ICD-10-CM | POA: Diagnosis not present

## 2022-03-08 DIAGNOSIS — J449 Chronic obstructive pulmonary disease, unspecified: Secondary | ICD-10-CM | POA: Diagnosis not present

## 2022-03-08 DIAGNOSIS — F419 Anxiety disorder, unspecified: Secondary | ICD-10-CM | POA: Diagnosis not present

## 2022-03-08 DIAGNOSIS — I872 Venous insufficiency (chronic) (peripheral): Secondary | ICD-10-CM | POA: Diagnosis not present

## 2022-03-08 DIAGNOSIS — F319 Bipolar disorder, unspecified: Secondary | ICD-10-CM | POA: Diagnosis not present

## 2022-03-08 DIAGNOSIS — T8744 Infection of amputation stump, left lower extremity: Secondary | ICD-10-CM | POA: Diagnosis not present

## 2022-03-08 DIAGNOSIS — E782 Mixed hyperlipidemia: Secondary | ICD-10-CM | POA: Diagnosis not present

## 2022-03-08 DIAGNOSIS — N1831 Chronic kidney disease, stage 3a: Secondary | ICD-10-CM | POA: Diagnosis not present

## 2022-03-08 DIAGNOSIS — E1151 Type 2 diabetes mellitus with diabetic peripheral angiopathy without gangrene: Secondary | ICD-10-CM | POA: Diagnosis not present

## 2022-03-08 DIAGNOSIS — I5032 Chronic diastolic (congestive) heart failure: Secondary | ICD-10-CM | POA: Diagnosis not present

## 2022-03-08 DIAGNOSIS — I130 Hypertensive heart and chronic kidney disease with heart failure and stage 1 through stage 4 chronic kidney disease, or unspecified chronic kidney disease: Secondary | ICD-10-CM | POA: Diagnosis not present

## 2022-03-08 DIAGNOSIS — E1122 Type 2 diabetes mellitus with diabetic chronic kidney disease: Secondary | ICD-10-CM | POA: Diagnosis not present

## 2022-03-08 DIAGNOSIS — I2510 Atherosclerotic heart disease of native coronary artery without angina pectoris: Secondary | ICD-10-CM

## 2022-03-08 MED ORDER — ATORVASTATIN CALCIUM 10 MG PO TABS: 10.0000 mg | ORAL_TABLET | Freq: Every day | ORAL | 3 refills | Status: AC

## 2022-03-11 ENCOUNTER — Telehealth (INDEPENDENT_AMBULATORY_CARE_PROVIDER_SITE_OTHER): Payer: Self-pay

## 2022-03-11 ENCOUNTER — Inpatient Hospital Stay: Payer: BC Managed Care – PPO | Attending: Internal Medicine | Admitting: Hospice and Palliative Medicine

## 2022-03-11 DIAGNOSIS — Z170 Estrogen receptor positive status [ER+]: Secondary | ICD-10-CM | POA: Diagnosis not present

## 2022-03-11 DIAGNOSIS — E1122 Type 2 diabetes mellitus with diabetic chronic kidney disease: Secondary | ICD-10-CM | POA: Diagnosis not present

## 2022-03-11 DIAGNOSIS — I5032 Chronic diastolic (congestive) heart failure: Secondary | ICD-10-CM | POA: Diagnosis not present

## 2022-03-11 DIAGNOSIS — T8744 Infection of amputation stump, left lower extremity: Secondary | ICD-10-CM | POA: Diagnosis not present

## 2022-03-11 DIAGNOSIS — F419 Anxiety disorder, unspecified: Secondary | ICD-10-CM | POA: Diagnosis not present

## 2022-03-11 DIAGNOSIS — E782 Mixed hyperlipidemia: Secondary | ICD-10-CM | POA: Diagnosis not present

## 2022-03-11 DIAGNOSIS — F319 Bipolar disorder, unspecified: Secondary | ICD-10-CM | POA: Diagnosis not present

## 2022-03-11 DIAGNOSIS — E1151 Type 2 diabetes mellitus with diabetic peripheral angiopathy without gangrene: Secondary | ICD-10-CM | POA: Diagnosis not present

## 2022-03-11 DIAGNOSIS — J449 Chronic obstructive pulmonary disease, unspecified: Secondary | ICD-10-CM | POA: Diagnosis not present

## 2022-03-11 DIAGNOSIS — E1169 Type 2 diabetes mellitus with other specified complication: Secondary | ICD-10-CM | POA: Diagnosis not present

## 2022-03-11 DIAGNOSIS — N1831 Chronic kidney disease, stage 3a: Secondary | ICD-10-CM | POA: Diagnosis not present

## 2022-03-11 DIAGNOSIS — I130 Hypertensive heart and chronic kidney disease with heart failure and stage 1 through stage 4 chronic kidney disease, or unspecified chronic kidney disease: Secondary | ICD-10-CM | POA: Diagnosis not present

## 2022-03-11 DIAGNOSIS — C50511 Malignant neoplasm of lower-outer quadrant of right female breast: Secondary | ICD-10-CM | POA: Diagnosis not present

## 2022-03-11 DIAGNOSIS — Z4781 Encounter for orthopedic aftercare following surgical amputation: Secondary | ICD-10-CM | POA: Diagnosis not present

## 2022-03-11 DIAGNOSIS — I872 Venous insufficiency (chronic) (peripheral): Secondary | ICD-10-CM | POA: Diagnosis not present

## 2022-03-11 NOTE — Progress Notes (Deleted)
MRN : [id number]  [name] is a 51 y.o. ([birth year]) female who presents with chief complaint of check circulation.  History of Present Illness:   The patient returns to the office for followup and review of the her left BKA wounds.   Procedure 11/15/2021:    1.  Right below-the-knee amputation.    2.  Debridement left below-knee amputation stump    3.  Application of a VAC dressing left below-knee amputation stump  There have been no interval changes in lower extremity symptoms. No interval development of rest pain symptoms. No new ulcers or wounds have occurred since the last visit.  There have been no significant changes to the patient's overall health care.  The patient denies amaurosis fugax or recent TIA symptoms. There are no documented recent neurological changes noted. There is no history of DVT, PE or superficial thrombophlebitis. The patient denies recent episodes of angina or shortness of breath.   ABI Rt=*** and Lt=***  (previous ABI's Rt=*** and Lt=***) Duplex ultrasound of the ***   No outpatient medications have been marked as taking for the 03/17/22 encounter (Appointment) with Delana Meyer, Dolores Lory, MD.    Past Medical History:  Diagnosis Date   Anxiety    takes Prozac daily   Anxiety    Aortic valve calcification    Asthma    Advair and Spirva daily   Asthma    Bipolar disorder ([place])    Breast cancer, right breast ([place]) 12/23/2019   CAD (coronary artery disease)    a. LHC 11/2013 done for CP/fluid retention: mild disease in prox LAD, mild-mod disease in mRCA, EF 60% with normal LVEDP. b. Normal nuc 03/2016.   Cancer ([place])    Cellulitis and abscess of trunk 03/12/2020   CHF (congestive heart failure) (HCC)    Chronic diastolic CHF (congestive heart failure) (HCC)    Chronic heart failure with preserved ejection fraction ([place]) 11/16/2018   CKD (chronic kidney disease), stage II    COPD (chronic obstructive pulmonary disease) (HCC)    a.  nocturnal O2.   COPD (chronic obstructive pulmonary disease) (HCC)    Coronary artery disease    Decreased urine stream    Diabetes mellitus    Diabetes mellitus without complication (HCC)    Dyspnea    Elevated C-reactive protein (CRP) 01/29/2018   Elevated sedimentation rate 01/29/2018   Elevated serum hCG 01/19/2018   Family history of adverse reaction to anesthesia    mom gets nauseated   Foot pain, bilateral 05/22/2018   GERD (gastroesophageal reflux disease)    takes Pepcid daily   History of blood clots    left leg 3-72yr ago   Hyperlipidemia    Hypertension    Hypertriglyceridemia    Inguinal hernia, left 01/2015   Muscle spasm    Open wound of genital labia    Peripheral neuropathy    Rash and nonspecific skin eruption consistent with small vessel vasculitis, Ddx includes embolic cause  52/08/9796  RBBB    Seizures ([facility]    Sepsis ([facility] 01/19/2018   Sinus tachycardia    a. persistent since 2009.   Smokers' cough ([place]    Status post mastectomy, right 01/14/2020   Stroke ([facility] 1989   left sided weakness   TIA (transient ischemic attack)    Tobacco abuse    Vulvar abscess 01/23/2018    Past Surgical History:  Procedure Laterality Date  AMPUTATION Left 10/11/2021   Procedure: AMPUTATION BELOW KNEE;  Surgeon: Algernon Huxley, MD;  Location: [facility];  Service: General;  Laterality: Left;   AMPUTATION Right 11/15/2021   Procedure: BELOW KNEE AMPUTATION;  Surgeon: Katha Cabal, MD;  Location: [facility];  Service: Vascular;  Laterality: Right;   APPLICATION OF WOUND VAC Right 01/29/2020   Procedure: APPLICATION OF WOUND VAC;  Surgeon: Ronny Bacon, MD;  Location: [facility];  Service: General;  Laterality: Right;   APPLICATION OF WOUND VAC Left 11/15/2021   Procedure: APPLICATION OF WOUND VAC;  Surgeon: Katha Cabal, MD;  Location: [facility];  Service: Vascular;  Laterality: Left;   BREAST BIOPSY Right 11/06/2019   [place] core path pending venus clip   GRAFT APPLICATION   8/58/8502   Procedure: GRAFT APPLICATION;  Surgeon: [name], DPM;  Location: [facility];  Service: Podiatry;;   HERNIA REPAIR Left    I & D EXTREMITY Left 11/05/2021   Procedure: IRRIGATION AND DEBRIDEMENT LEFT BELOW KNEE STUMP;  Surgeon: Katha Cabal, MD;  Location: [facility];  Service: Vascular;  Laterality: Left;   INCISION AND DRAINAGE Bilateral 09/21/2021   Procedure: INCISION AND DRAINAGE;  Surgeon: [name], DPM;  Location: [facility];  Service: Podiatry;  Laterality: Bilateral;   INCISION AND DRAINAGE ABSCESS N/A 01/26/2018   Procedure: INCISION AND DEBRIDEMENT OF VULVAR NECROTIZING SOFT TISSUE INFECTION;  Surgeon: Greer Pickerel, MD;  Location: [place];  Service: General;  Laterality: N/A;   INCISION AND DRAINAGE ABSCESS N/A 07/21/2021   Procedure: INCISION AND DRAINAGE ABSCESS Scalp;  Surgeon: Robert Bellow, MD;  Location: [facility];  Service: General;  Laterality: N/A;  scalp and right abdomen   INCISION AND DRAINAGE PERIRECTAL ABSCESS N/A 01/22/2018   Procedure: IRRIGATION AND DEBRIDEMENT LABIAL/VULVAR AREA;  Surgeon: Coralie Keens, MD;  Location: [place];  Service: General;  Laterality: N/A;   INCISION AND DRAINAGE PERIRECTAL ABSCESS N/A 01/29/2018   Procedure: IRRIGATION AND DEBRIDEMENT VULVA;  Surgeon: Excell Seltzer, MD;  Location: [place];  Service: General;  Laterality: N/A;   INGUINAL HERNIA REPAIR Left 04/08/2015   Procedure: OPEN LEFT INGUINAL HERNIA REPAIR WITH MESH;  Surgeon: Ralene Ok, MD;  Location: [place];  Service: General;  Laterality: Left;   INSERTION OF MESH Left 04/08/2015   Procedure: INSERTION OF MESH;  Surgeon: Ralene Ok, MD;  Location: [place];  Service: General;  Laterality: Left;   IRRIGATION AND DEBRIDEMENT ABSCESS N/A 07/21/2021   Procedure: IRRIGATION AND DEBRIDEMENT ABSCESS abdomen;  Surgeon: Robert Bellow, MD;  Location: [facility];  Service: General;  Laterality: N/A;   LAPAROSCOPY     Endometriosis   LEFT HEART CATHETERIZATION  WITH CORONARY ANGIOGRAM N/A 12/05/2013   Procedure: LEFT HEART CATHETERIZATION WITH CORONARY ANGIOGRAM;  Surgeon: Jettie Booze, MD;  Location: [facility];  Service: Cardiovascular;  Laterality: N/A;   LOWER EXTREMITY ANGIOGRAPHY Left 09/22/2021   Procedure: Lower Extremity Angiography;  Surgeon: Katha Cabal, MD;  Location: [place] CV LAB;  Service: Cardiovascular;  Laterality: Left;   LOWER EXTREMITY ANGIOGRAPHY Right 11/09/2021   Procedure: Lower Extremity Angiography;  Surgeon: Katha Cabal, MD;  Location: [place] [facility];  Service: Cardiovascular;  Laterality: Right;   MASTECTOMY Right    MINOR APPLICATION OF WOUND VAC Left 11/05/2021   Procedure: MINOR APPLICATION OF WOUND VAC;  Surgeon: Katha Cabal, MD;  Location: [facility];  Service: Vascular;  Laterality: Left;   right kidney drained  SIMPLE MASTECTOMY WITH AXILLARY SENTINEL NODE BIOPSY Right 12/23/2019   Procedure: Right SIMPLE MASTECTOMY WITH AXILLARY SENTINEL NODE BIOPSY;  Surgeon: Ronny Bacon, MD;  Location: [facility];  Service: General;  Laterality: Right;   TEE WITHOUT CARDIOVERSION N/A 11/28/2013   Procedure: TRANSESOPHAGEAL ECHOCARDIOGRAM (TEE);  Surgeon: Thayer Headings, MD;  Location: [place];  Service: Cardiovascular;  Laterality: N/A;   TEE WITHOUT CARDIOVERSION N/A 10/13/2021   Procedure: TRANSESOPHAGEAL ECHOCARDIOGRAM (TEE);  Surgeon: Corey Skains, MD;  Location: [facility];  Service: Cardiovascular;  Laterality: N/A;   WOUND DEBRIDEMENT Right 01/29/2020   Procedure: DEBRIDEMENT WOUND, Excisional debridement skin, subcutaneous and muscle right chest wall;  Surgeon: Ronny Bacon, MD;  Location: [facility];  Service: General;  Laterality: Right;    Social History Social History   Tobacco Use   Smoking status: Every Day    Packs/day: 0.25    Years: 36.00    Total pack years: 9.00    Types: Cigarettes    Start date: 07/11/1981   Smokeless tobacco: Never   Tobacco comments:     1 ppd now  Vaping Use   Vaping Use: Some days  Substance Use Topics   Alcohol use: No   Drug use: No    Family History Family History  Problem Relation Age of Onset   Venous thrombosis Brother    Other Brother        BRAIN TUMOR   Asthma Father    Diabetes Father    Coronary artery disease Mother    Hypertension Mother    Diabetes Mother    Breast cancer Mother 31   Asthma Sister    Diabetes type II Brother    Diabetes Brother     Allergies  Allergen Reactions   Adhesive [Tape] Rash and Other (See Comments)    TAKES OFF THE SKIN (CERTAIN MEDICAL TAPES DO THIS!!)   Metoprolol Shortness Of Breath    Occurrence of shortness of breath after 3 days   Montelukast Shortness Of Breath   Morphine Sulfate Anaphylaxis, Shortness Of Breath and Nausea And Vomiting    Swollen Throat - Able to tolerate dilaudid   Penicillins Anaphylaxis, Hives and Shortness Of Breath    Throat swells Has patient had a PCN reaction causing immediate rash, facial/tongue/throat swelling, SOB or lightheadedness with hypotension: Yes Has patient had a PCN reaction causing severe rash involving mucus membranes or skin necrosis: No Has patient had a PCN reaction that required hospitalization: Yes Has patient had a PCN reaction occurring within the last 10 years: No If all of the above answers are "NO", then may proceed with Cephalosporin use.    Silicone Rash    Takes off the skin (certain medical tapes do this)   Diltiazem Swelling   Gabapentin Swelling   Midodrine     Lightheaded and falling down   Percocet [Oxycodone-Acetaminophen] Rash     REVIEW OF SYSTEMS (Negative unless checked)  Constitutional: '[]'$ Weight loss  '[]'$ Fever  '[]'$ Chills Cardiac: '[]'$ Chest pain   '[]'$ Chest pressure   '[]'$ Palpitations   '[]'$ Shortness of breath when laying flat   '[]'$ Shortness of breath with exertion. Vascular:  '[x]'$ Pain in legs with walking   '[]'$ Pain in legs at rest  '[]'$ History of DVT   '[]'$ Phlebitis   '[]'$ Swelling in legs   '[]'$ Varicose  veins   '[]'$ Non-healing ulcers Pulmonary:   '[]'$ Uses home oxygen   '[]'$ Productive cough   '[]'$ Hemoptysis   '[]'$ Wheeze  '[]'$ COPD   '[]'$ Asthma Neurologic:  '[]'$ Dizziness   '[]'$ Seizures   '[]'$ History  of stroke   '[]'$ History of TIA  '[]'$ Aphasia   '[]'$ Vissual changes   '[]'$ Weakness or numbness in arm   '[]'$ Weakness or numbness in leg Musculoskeletal:   '[]'$ Joint swelling   '[]'$ Joint pain   '[]'$ Low back pain Hematologic:  '[]'$ Easy bruising  '[]'$ Easy bleeding   '[]'$ Hypercoagulable state   '[]'$ Anemic Gastrointestinal:  '[]'$ Diarrhea   '[]'$ Vomiting  '[]'$ Gastroesophageal reflux/heartburn   '[]'$ Difficulty swallowing. Genitourinary:  '[]'$ Chronic kidney disease   '[]'$ Difficult urination  '[]'$ Frequent urination   '[]'$ Blood in urine Skin:  '[]'$ Rashes   '[]'$ Ulcers  Psychological:  '[]'$ History of anxiety   '[]'$  History of major depression.  Physical Examination  There were no vitals filed for this visit. There is no height or weight on file to calculate BMI. Gen: WD/WN, NAD Head: [place]/AT, No temporalis wasting.  Ear/Nose/Throat: Hearing grossly intact, nares w/o erythema or drainage Eyes: PER, EOMI, sclera nonicteric.  Neck: Supple, no masses.  No bruit or JVD.  Pulmonary:  Good air movement, no audible wheezing, no use of accessory muscles.  Cardiac: RRR, normal S1, S2, no Murmurs. Vascular:  mild trophic changes, no open wounds Vessel Right Left  Radial Palpable Palpable  PT Not Palpable Not Palpable  DP Not Palpable Not Palpable  Gastrointestinal: soft, non-distended. No guarding/no peritoneal signs.  Musculoskeletal: M/S 5/5 throughout.  No visible deformity.  Neurologic: CN 2-12 intact. Pain and light touch intact in extremities.  Symmetrical.  Speech is fluent. Motor exam as listed above. Psychiatric: Judgment intact, Mood & affect appropriate for pt's clinical situation. Dermatologic: No rashes or ulcers noted.  No changes consistent with cellulitis.   CBC Lab Results  Component Value Date   WBC 5.3 02/09/2022   HGB 10.1 (L) 02/09/2022   HCT 33.1 (L)  02/09/2022   MCV 78.3 (L) 02/09/2022   PLT 167 02/09/2022    BMET    Component Value Date/Time   NA 134 (L) 02/09/2022 0852   NA 137 01/08/2018 1117   K 3.7 02/09/2022 0852   CL 102 02/09/2022 0852   CO2 25 02/09/2022 0852   GLUCOSE 243 (H) 02/09/2022 0852   BUN 19 02/09/2022 0852   BUN 31 (H) 01/08/2018 1117   CREATININE 0.73 02/09/2022 0852   CREATININE 1.59 (H) 07/18/2019 1506   CALCIUM 9.3 02/09/2022 0852   GFRNONAA >60 02/09/2022 0852   GFRAA 51 (L) 02/25/2020 1504   CrCl cannot be calculated (Patient's most recent lab result is older than the maximum 21 days allowed.).  COAG Lab Results  Component Value Date   INR 1.4 (H) 11/04/2021   INR 1.2 09/20/2021   INR 1.0 03/21/2019    Radiology No results found.   Assessment/Plan There are no diagnoses linked to this encounter.   Nancy Pilar, MD  03/11/2022 8:01 AM

## 2022-03-11 NOTE — Telephone Encounter (Signed)
Mickel Baas called stating the pt's son thinks he has found a staple in the pt's right stump area.  She wanted to know if she could get orders to take it out or did the dr want to see it first.  I spoke with Eulogio Ditch NP and she states to give her verbal orders to remove the staple.  I called and spoke with [place] Ranger and gave her the verbal orders and she stated understanding and will reach out to [place] if she needs anything further.

## 2022-03-11 NOTE — Progress Notes (Signed)
Virtual Visit via Telephone Note  I connected with [name] on 03/11/22 at 10:30 AM EDT by telephone and verified that I am speaking with the correct person using two identifiers.  Location: Patient: Home Provider: Clinic   I discussed the limitations, risks, security and privacy concerns of performing an evaluation and management service by telephone and the availability of in person appointments. I also discussed with the patient that there may be a patient responsible charge related to this service. The patient expressed understanding and agreed to proceed.   History of Present Illness: [name] is a 51 y.o. female with multiple medical problems including stage II right-sided ER/PR positive, HER2 negative breast cancer on adjuvant anastrozole and Lupron with multiple comorbidities including poorly controlled diabetes/bipolar disorder/active smoker/neuropathy.  Patient is status post right mastectomy in 2021.  She has recent amputation of bilateral lower extremities complicated by MRSA infection of left BKA.    Observations/Objective: I called and spoke with patient by phone.  She reports that she is doing about the same without significant changes or concerns.  No symptomatic complaints at present.  Patient reports that she plans to speak with Dr. Jacinto Reap in November and is interested in discussing treatment options for cancer.  She was previously being followed by palliative care from [facility] the patient says that she is no longer being followed by them and she is interested in establishing care locally.  We will send referral to [facility]  Assessment and Plan: Breast cancer -on Eligard Depo every 3 months.  Patient followed by Dr. Jacinto Reap and has clinic visit scheduled in November.  Referral to palliative care  PVD status post bilateral amputation complicated by MRSA infection.  She is being followed by vascular.  Follow Up Instructions: Follow-up 2 months   I discussed the  assessment and treatment plan with the patient. The patient was provided an opportunity to ask questions and all were answered. The patient agreed with the plan and demonstrated an understanding of the instructions.   The patient was advised to call back or seek an in-person evaluation if the symptoms worsen or if the condition fails to improve as anticipated.  I provided 5 minutes of non-face-to-face time during this encounter.   Irean Hong, NP

## 2022-03-15 ENCOUNTER — Telehealth: Payer: Self-pay

## 2022-03-15 DIAGNOSIS — E1169 Type 2 diabetes mellitus with other specified complication: Secondary | ICD-10-CM | POA: Diagnosis not present

## 2022-03-15 DIAGNOSIS — E1151 Type 2 diabetes mellitus with diabetic peripheral angiopathy without gangrene: Secondary | ICD-10-CM | POA: Diagnosis not present

## 2022-03-15 DIAGNOSIS — F419 Anxiety disorder, unspecified: Secondary | ICD-10-CM | POA: Diagnosis not present

## 2022-03-15 DIAGNOSIS — I872 Venous insufficiency (chronic) (peripheral): Secondary | ICD-10-CM | POA: Diagnosis not present

## 2022-03-15 DIAGNOSIS — F319 Bipolar disorder, unspecified: Secondary | ICD-10-CM | POA: Diagnosis not present

## 2022-03-15 DIAGNOSIS — I130 Hypertensive heart and chronic kidney disease with heart failure and stage 1 through stage 4 chronic kidney disease, or unspecified chronic kidney disease: Secondary | ICD-10-CM | POA: Diagnosis not present

## 2022-03-15 DIAGNOSIS — T8744 Infection of amputation stump, left lower extremity: Secondary | ICD-10-CM | POA: Diagnosis not present

## 2022-03-15 DIAGNOSIS — N1831 Chronic kidney disease, stage 3a: Secondary | ICD-10-CM | POA: Diagnosis not present

## 2022-03-15 DIAGNOSIS — E782 Mixed hyperlipidemia: Secondary | ICD-10-CM | POA: Diagnosis not present

## 2022-03-15 DIAGNOSIS — E1122 Type 2 diabetes mellitus with diabetic chronic kidney disease: Secondary | ICD-10-CM | POA: Diagnosis not present

## 2022-03-15 DIAGNOSIS — I5032 Chronic diastolic (congestive) heart failure: Secondary | ICD-10-CM | POA: Diagnosis not present

## 2022-03-15 DIAGNOSIS — Z4781 Encounter for orthopedic aftercare following surgical amputation: Secondary | ICD-10-CM | POA: Diagnosis not present

## 2022-03-15 DIAGNOSIS — J449 Chronic obstructive pulmonary disease, unspecified: Secondary | ICD-10-CM | POA: Diagnosis not present

## 2022-03-15 NOTE — Telephone Encounter (Signed)
Spoke with patient and scheduled a Mychart Palliative Consult for 03/28/22 @ 2 PM per provider's availability.   Consent obtained; updated Netsmart, Team List and Epic.

## 2022-03-16 ENCOUNTER — Telehealth: Payer: BC Managed Care – PPO | Admitting: Hospice and Palliative Medicine

## 2022-03-17 ENCOUNTER — Ambulatory Visit (INDEPENDENT_AMBULATORY_CARE_PROVIDER_SITE_OTHER): Payer: Medicare Other | Admitting: Vascular Surgery

## 2022-03-17 ENCOUNTER — Telehealth (INDEPENDENT_AMBULATORY_CARE_PROVIDER_SITE_OTHER): Payer: Self-pay | Admitting: Vascular Surgery

## 2022-03-17 DIAGNOSIS — I6521 Occlusion and stenosis of right carotid artery: Secondary | ICD-10-CM

## 2022-03-17 DIAGNOSIS — I2510 Atherosclerotic heart disease of native coronary artery without angina pectoris: Secondary | ICD-10-CM

## 2022-03-17 DIAGNOSIS — I152 Hypertension secondary to endocrine disorders: Secondary | ICD-10-CM

## 2022-03-17 DIAGNOSIS — Z89511 Acquired absence of right leg below knee: Secondary | ICD-10-CM

## 2022-03-17 DIAGNOSIS — E1151 Type 2 diabetes mellitus with diabetic peripheral angiopathy without gangrene: Secondary | ICD-10-CM

## 2022-03-17 DIAGNOSIS — I739 Peripheral vascular disease, unspecified: Secondary | ICD-10-CM

## 2022-03-17 NOTE — Telephone Encounter (Signed)
Spoke with pt and she states that her husband just got out of the hospital and he normally takes her to her appts and keeps up with them.  She states she is very sorry she missed the appt she did not know about it.  She is trying to find a transportation company then she will call our office back to reschedule her appt.  She states understanding about the med not being filled until she is seen.

## 2022-03-17 NOTE — Telephone Encounter (Signed)
Patient called needing her pain medication refilled.  I spoke to gs and he said she needs to come in first./  She had an appointment this morning and she was a no show.

## 2022-03-22 DIAGNOSIS — I872 Venous insufficiency (chronic) (peripheral): Secondary | ICD-10-CM | POA: Diagnosis not present

## 2022-03-22 DIAGNOSIS — I130 Hypertensive heart and chronic kidney disease with heart failure and stage 1 through stage 4 chronic kidney disease, or unspecified chronic kidney disease: Secondary | ICD-10-CM | POA: Diagnosis not present

## 2022-03-22 DIAGNOSIS — J449 Chronic obstructive pulmonary disease, unspecified: Secondary | ICD-10-CM | POA: Diagnosis not present

## 2022-03-22 DIAGNOSIS — E1169 Type 2 diabetes mellitus with other specified complication: Secondary | ICD-10-CM | POA: Diagnosis not present

## 2022-03-22 DIAGNOSIS — F419 Anxiety disorder, unspecified: Secondary | ICD-10-CM | POA: Diagnosis not present

## 2022-03-22 DIAGNOSIS — I5032 Chronic diastolic (congestive) heart failure: Secondary | ICD-10-CM | POA: Diagnosis not present

## 2022-03-22 DIAGNOSIS — E782 Mixed hyperlipidemia: Secondary | ICD-10-CM | POA: Diagnosis not present

## 2022-03-22 DIAGNOSIS — F319 Bipolar disorder, unspecified: Secondary | ICD-10-CM | POA: Diagnosis not present

## 2022-03-22 DIAGNOSIS — T8744 Infection of amputation stump, left lower extremity: Secondary | ICD-10-CM | POA: Diagnosis not present

## 2022-03-22 DIAGNOSIS — N1831 Chronic kidney disease, stage 3a: Secondary | ICD-10-CM | POA: Diagnosis not present

## 2022-03-22 DIAGNOSIS — Z4781 Encounter for orthopedic aftercare following surgical amputation: Secondary | ICD-10-CM | POA: Diagnosis not present

## 2022-03-22 DIAGNOSIS — E1151 Type 2 diabetes mellitus with diabetic peripheral angiopathy without gangrene: Secondary | ICD-10-CM | POA: Diagnosis not present

## 2022-03-22 DIAGNOSIS — E1122 Type 2 diabetes mellitus with diabetic chronic kidney disease: Secondary | ICD-10-CM | POA: Diagnosis not present

## 2022-03-23 IMAGING — DX DG HAND COMPLETE 3+V*L*
3 series · 3 of 3 positions shown · non-contrast
Comparison: 05/15/2020

CLINICAL DATA: Pain and swelling

EXAM:
LEFT HAND - COMPLETE 3+ VIEW

[hand pa]
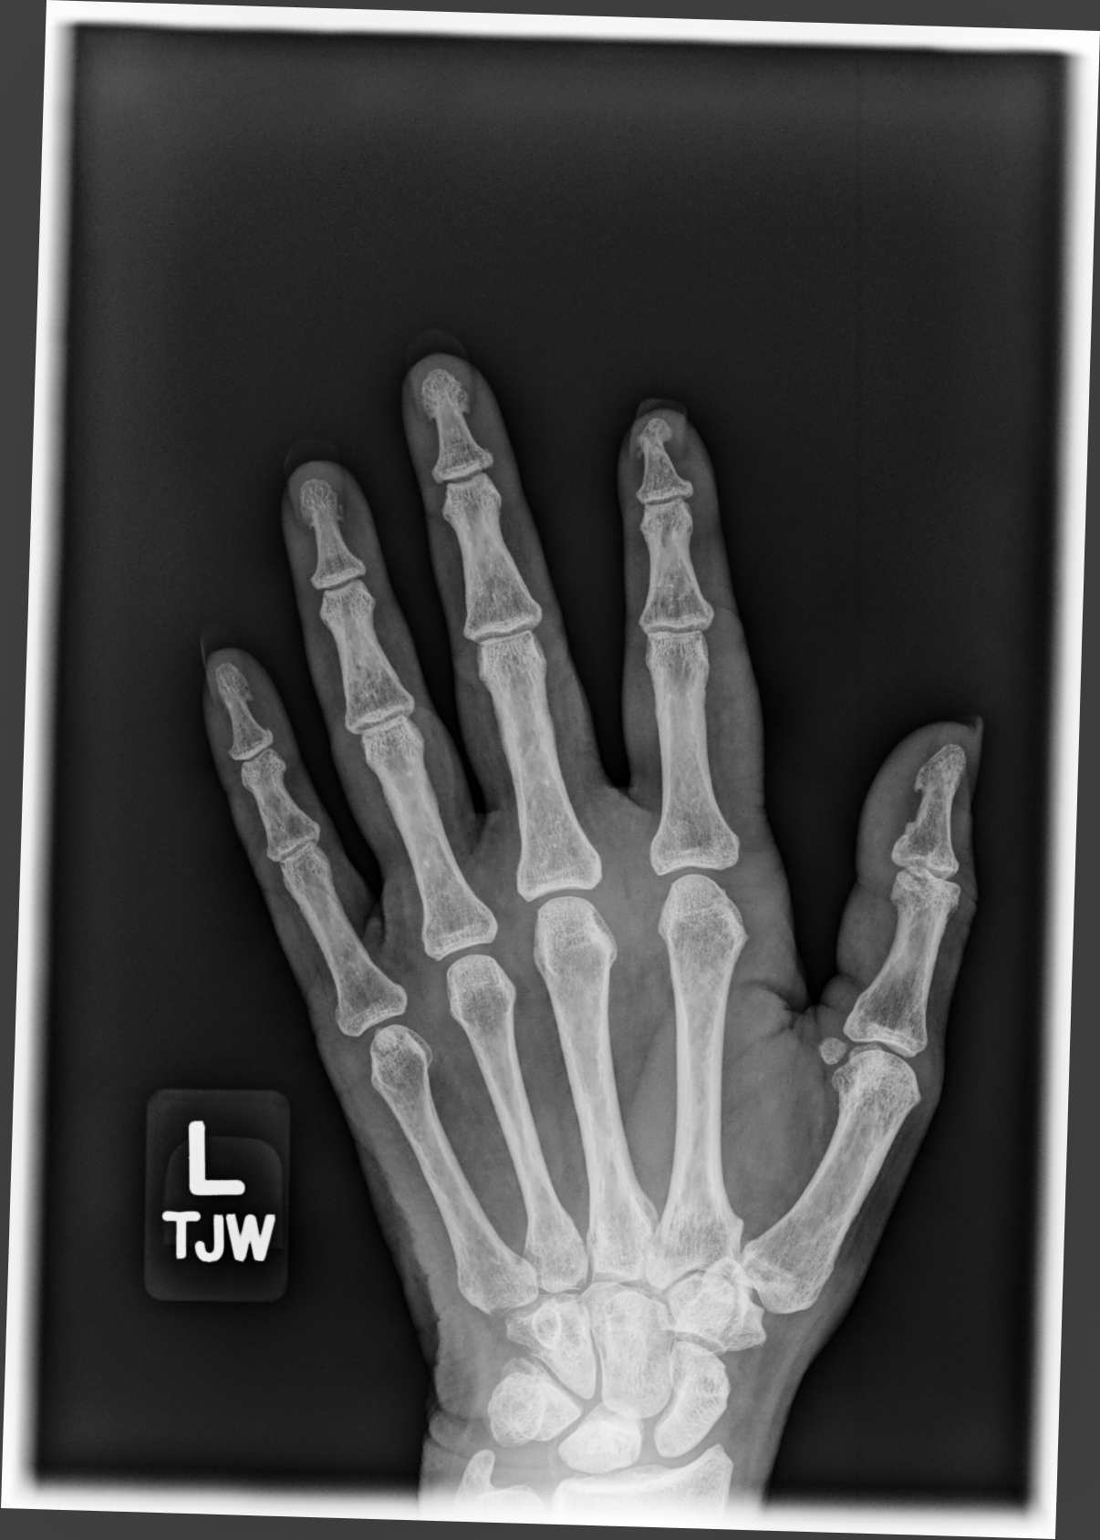

[hand mlo]
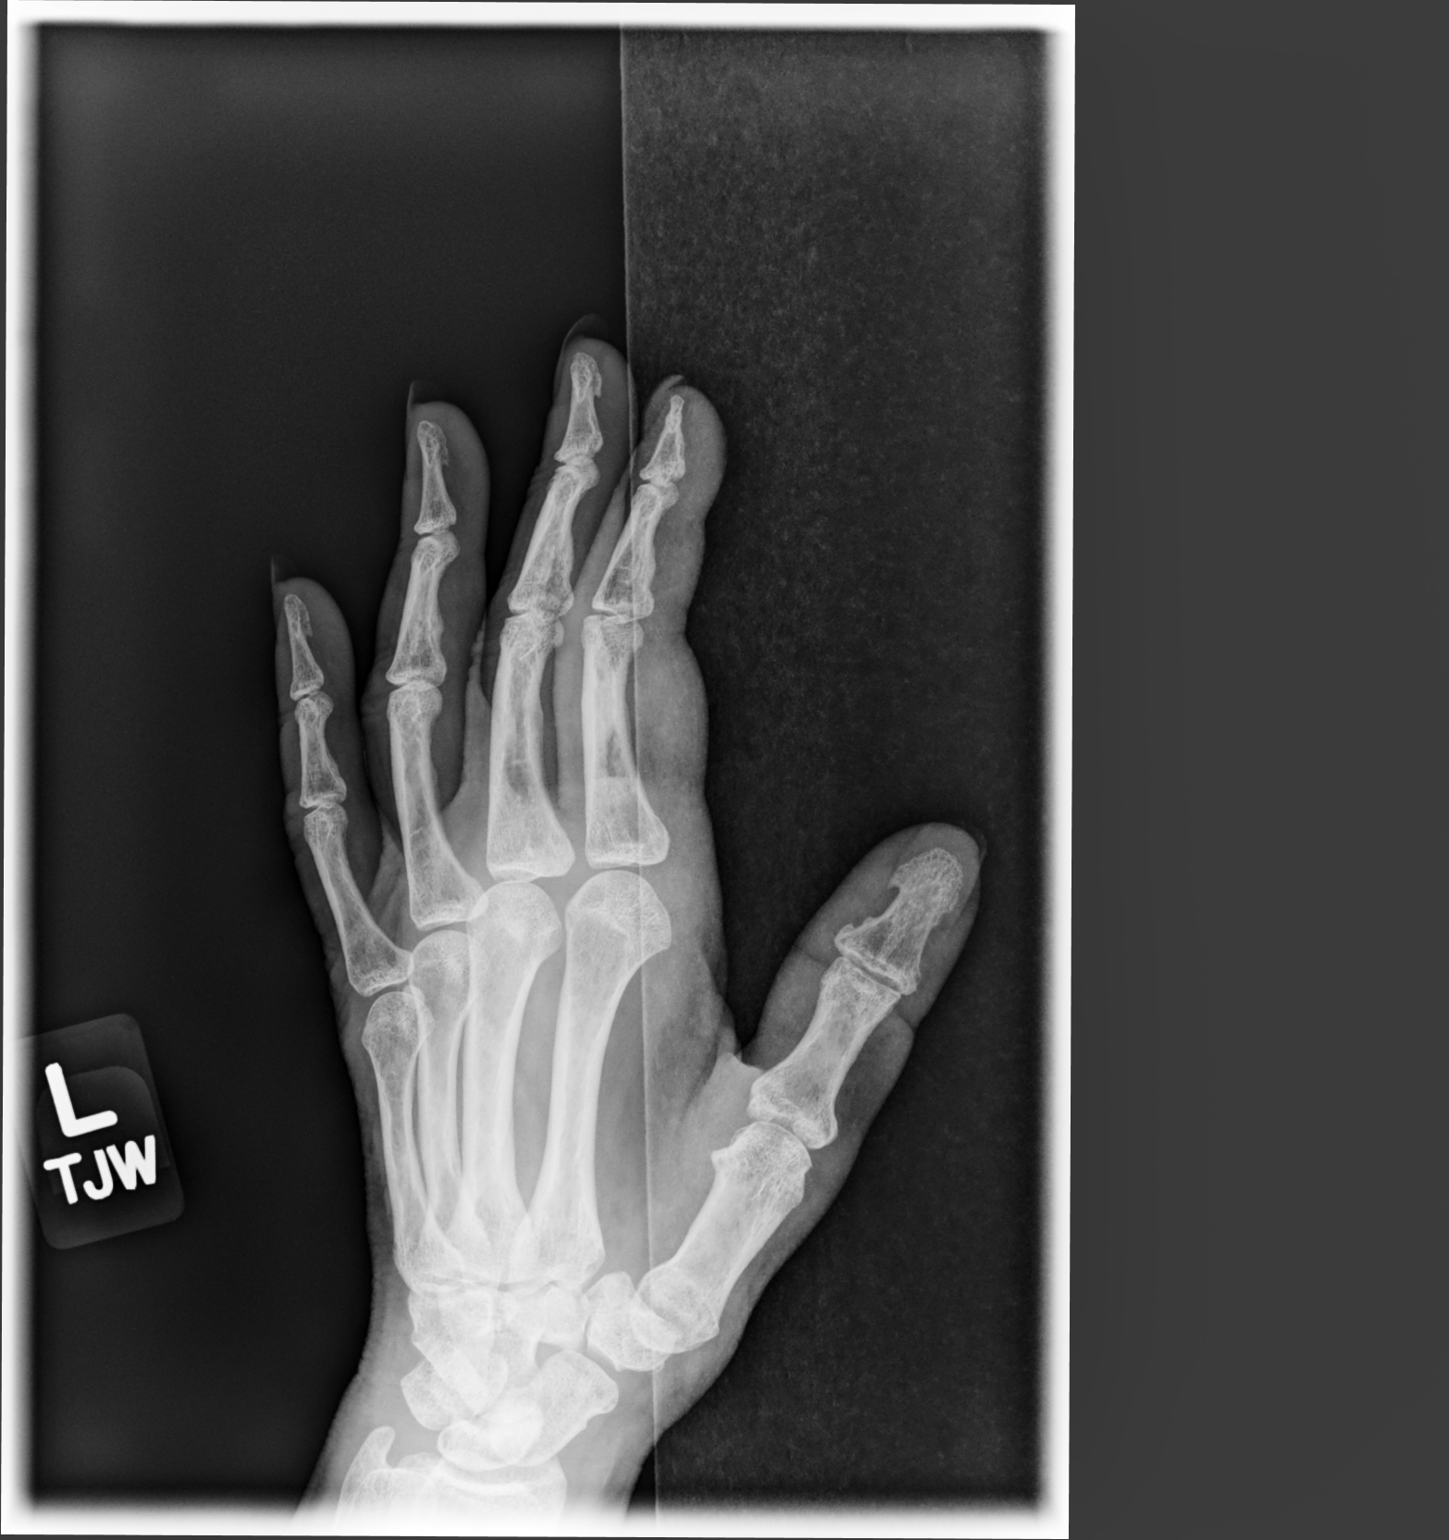

[hand lat]
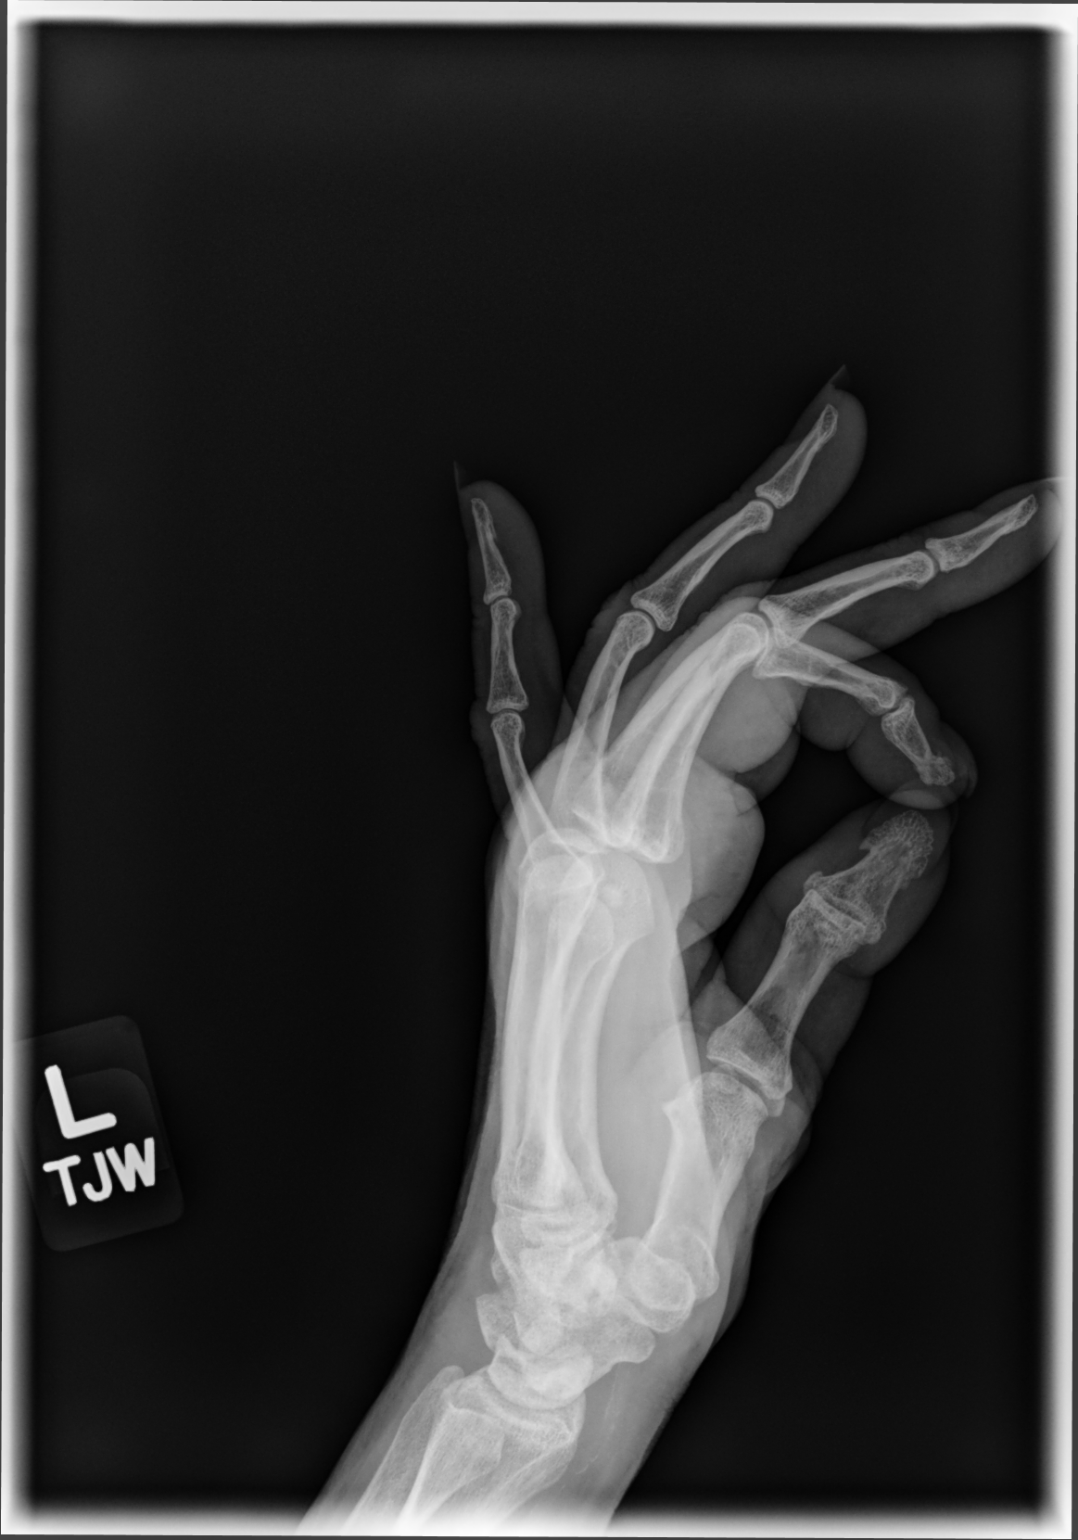

[3 of 3 positions shown; findings below may reference images not displayed]

FINDINGS: There is deformity at the tip of distal phalanx of the index finger
with no significant change suggesting old healed fracture. No
definite recent fracture or dislocation is seen. There are no focal
lytic lesions. Small bony spurs seen in the first
metacarpophalangeal joint and the interphalangeal joint of thumb.
There is soft tissue fullness in the subcutaneous plane in the index
finger, especially over the proximal phalanx. There are no opaque
foreign bodies.
IMPRESSION: No recent fracture or dislocation is seen. There are no focal lytic
lesions. Deformity in the tip of distal phalanx of index finger may
suggest old healed fracture. If there is clinical suspicion for
osteomyelitis, follow-up three-phase bone scan or MRI may be
considered.

## 2022-03-27 ENCOUNTER — Other Ambulatory Visit: Payer: Self-pay | Admitting: Primary Care

## 2022-03-27 DIAGNOSIS — G2581 Restless legs syndrome: Secondary | ICD-10-CM

## 2022-03-28 ENCOUNTER — Encounter: Payer: Self-pay | Admitting: Internal Medicine

## 2022-03-28 ENCOUNTER — Telehealth: Payer: Medicare Other | Admitting: Internal Medicine

## 2022-03-29 ENCOUNTER — Other Ambulatory Visit: Payer: Self-pay

## 2022-03-29 ENCOUNTER — Inpatient Hospital Stay
Admission: EM | Admit: 2022-03-29 | Discharge: 2022-04-02 | DRG: 092 | Disposition: A | Discharge status: Home Health | Payer: BC Managed Care – PPO | Attending: Internal Medicine | Admitting: Internal Medicine

## 2022-03-29 ENCOUNTER — Telehealth: Payer: Self-pay | Admitting: *Deleted

## 2022-03-29 ENCOUNTER — Emergency Department: Payer: BC Managed Care – PPO

## 2022-03-29 ENCOUNTER — Observation Stay: Payer: BC Managed Care – PPO

## 2022-03-29 DIAGNOSIS — L89312 Pressure ulcer of right buttock, stage 2: Secondary | ICD-10-CM | POA: Diagnosis not present

## 2022-03-29 DIAGNOSIS — T43215A Adverse effect of selective serotonin and norepinephrine reuptake inhibitors, initial encounter: Secondary | ICD-10-CM | POA: Diagnosis present

## 2022-03-29 DIAGNOSIS — Z803 Family history of malignant neoplasm of breast: Secondary | ICD-10-CM

## 2022-03-29 DIAGNOSIS — Z20822 Contact with and (suspected) exposure to covid-19: Secondary | ICD-10-CM | POA: Diagnosis not present

## 2022-03-29 DIAGNOSIS — T424X5A Adverse effect of benzodiazepines, initial encounter: Secondary | ICD-10-CM | POA: Diagnosis present

## 2022-03-29 DIAGNOSIS — I700 Atherosclerosis of aorta: Secondary | ICD-10-CM | POA: Diagnosis not present

## 2022-03-29 DIAGNOSIS — J449 Chronic obstructive pulmonary disease, unspecified: Secondary | ICD-10-CM | POA: Diagnosis not present

## 2022-03-29 DIAGNOSIS — Z794 Long term (current) use of insulin: Secondary | ICD-10-CM | POA: Diagnosis not present

## 2022-03-29 DIAGNOSIS — Z885 Allergy status to narcotic agent status: Secondary | ICD-10-CM

## 2022-03-29 DIAGNOSIS — E119 Type 2 diabetes mellitus without complications: Secondary | ICD-10-CM

## 2022-03-29 DIAGNOSIS — Z853 Personal history of malignant neoplasm of breast: Secondary | ICD-10-CM

## 2022-03-29 DIAGNOSIS — F319 Bipolar disorder, unspecified: Secondary | ICD-10-CM | POA: Diagnosis present

## 2022-03-29 DIAGNOSIS — I672 Cerebral atherosclerosis: Secondary | ICD-10-CM | POA: Diagnosis not present

## 2022-03-29 DIAGNOSIS — L89322 Pressure ulcer of left buttock, stage 2: Secondary | ICD-10-CM | POA: Diagnosis present

## 2022-03-29 DIAGNOSIS — G629 Polyneuropathy, unspecified: Secondary | ICD-10-CM

## 2022-03-29 DIAGNOSIS — L8990 Pressure ulcer of unspecified site, unspecified stage: Secondary | ICD-10-CM | POA: Diagnosis present

## 2022-03-29 DIAGNOSIS — C50911 Malignant neoplasm of unspecified site of right female breast: Secondary | ICD-10-CM | POA: Diagnosis not present

## 2022-03-29 DIAGNOSIS — E1151 Type 2 diabetes mellitus with diabetic peripheral angiopathy without gangrene: Secondary | ICD-10-CM | POA: Diagnosis present

## 2022-03-29 DIAGNOSIS — K449 Diaphragmatic hernia without obstruction or gangrene: Secondary | ICD-10-CM | POA: Diagnosis not present

## 2022-03-29 DIAGNOSIS — E781 Pure hyperglyceridemia: Secondary | ICD-10-CM | POA: Diagnosis present

## 2022-03-29 DIAGNOSIS — Z89511 Acquired absence of right leg below knee: Secondary | ICD-10-CM

## 2022-03-29 DIAGNOSIS — F419 Anxiety disorder, unspecified: Secondary | ICD-10-CM | POA: Diagnosis present

## 2022-03-29 DIAGNOSIS — T43595A Adverse effect of other antipsychotics and neuroleptics, initial encounter: Secondary | ICD-10-CM | POA: Diagnosis present

## 2022-03-29 DIAGNOSIS — E1142 Type 2 diabetes mellitus with diabetic polyneuropathy: Secondary | ICD-10-CM | POA: Diagnosis present

## 2022-03-29 DIAGNOSIS — K760 Fatty (change of) liver, not elsewhere classified: Secondary | ICD-10-CM | POA: Diagnosis not present

## 2022-03-29 DIAGNOSIS — Q791 Other congenital malformations of diaphragm: Secondary | ICD-10-CM | POA: Diagnosis not present

## 2022-03-29 DIAGNOSIS — E1140 Type 2 diabetes mellitus with diabetic neuropathy, unspecified: Secondary | ICD-10-CM | POA: Diagnosis present

## 2022-03-29 DIAGNOSIS — R509 Fever, unspecified: Secondary | ICD-10-CM | POA: Diagnosis not present

## 2022-03-29 DIAGNOSIS — T43225A Adverse effect of selective serotonin reuptake inhibitors, initial encounter: Secondary | ICD-10-CM | POA: Diagnosis present

## 2022-03-29 DIAGNOSIS — F17210 Nicotine dependence, cigarettes, uncomplicated: Secondary | ICD-10-CM | POA: Diagnosis present

## 2022-03-29 DIAGNOSIS — Z8673 Personal history of transient ischemic attack (TIA), and cerebral infarction without residual deficits: Secondary | ICD-10-CM | POA: Diagnosis not present

## 2022-03-29 DIAGNOSIS — G9340 Encephalopathy, unspecified: Secondary | ICD-10-CM

## 2022-03-29 DIAGNOSIS — Z7951 Long term (current) use of inhaled steroids: Secondary | ICD-10-CM

## 2022-03-29 DIAGNOSIS — R531 Weakness: Secondary | ICD-10-CM | POA: Diagnosis not present

## 2022-03-29 DIAGNOSIS — N182 Chronic kidney disease, stage 2 (mild): Secondary | ICD-10-CM | POA: Diagnosis present

## 2022-03-29 DIAGNOSIS — R4182 Altered mental status, unspecified: Secondary | ICD-10-CM | POA: Diagnosis not present

## 2022-03-29 DIAGNOSIS — G928 Other toxic encephalopathy: Secondary | ICD-10-CM | POA: Diagnosis not present

## 2022-03-29 DIAGNOSIS — E669 Obesity, unspecified: Secondary | ICD-10-CM | POA: Diagnosis present

## 2022-03-29 DIAGNOSIS — T426X5A Adverse effect of other antiepileptic and sedative-hypnotic drugs, initial encounter: Secondary | ICD-10-CM | POA: Diagnosis present

## 2022-03-29 DIAGNOSIS — I517 Cardiomegaly: Secondary | ICD-10-CM | POA: Diagnosis not present

## 2022-03-29 DIAGNOSIS — Z79811 Long term (current) use of aromatase inhibitors: Secondary | ICD-10-CM

## 2022-03-29 DIAGNOSIS — I130 Hypertensive heart and chronic kidney disease with heart failure and stage 1 through stage 4 chronic kidney disease, or unspecified chronic kidney disease: Secondary | ICD-10-CM | POA: Diagnosis not present

## 2022-03-29 DIAGNOSIS — I5032 Chronic diastolic (congestive) heart failure: Secondary | ICD-10-CM | POA: Diagnosis not present

## 2022-03-29 DIAGNOSIS — T428X5A Adverse effect of antiparkinsonism drugs and other central muscle-tone depressants, initial encounter: Secondary | ICD-10-CM | POA: Diagnosis present

## 2022-03-29 DIAGNOSIS — Z888 Allergy status to other drugs, medicaments and biological substances status: Secondary | ICD-10-CM

## 2022-03-29 DIAGNOSIS — Z9011 Acquired absence of right breast and nipple: Secondary | ICD-10-CM

## 2022-03-29 DIAGNOSIS — Z8249 Family history of ischemic heart disease and other diseases of the circulatory system: Secondary | ICD-10-CM

## 2022-03-29 DIAGNOSIS — K219 Gastro-esophageal reflux disease without esophagitis: Secondary | ICD-10-CM | POA: Diagnosis present

## 2022-03-29 DIAGNOSIS — E1129 Type 2 diabetes mellitus with other diabetic kidney complication: Secondary | ICD-10-CM | POA: Diagnosis present

## 2022-03-29 DIAGNOSIS — Z880 Allergy status to penicillin: Secondary | ICD-10-CM

## 2022-03-29 DIAGNOSIS — R809 Proteinuria, unspecified: Secondary | ICD-10-CM | POA: Diagnosis present

## 2022-03-29 DIAGNOSIS — E1122 Type 2 diabetes mellitus with diabetic chronic kidney disease: Secondary | ICD-10-CM | POA: Diagnosis not present

## 2022-03-29 DIAGNOSIS — Z7982 Long term (current) use of aspirin: Secondary | ICD-10-CM

## 2022-03-29 DIAGNOSIS — Z825 Family history of asthma and other chronic lower respiratory diseases: Secondary | ICD-10-CM

## 2022-03-29 DIAGNOSIS — E1165 Type 2 diabetes mellitus with hyperglycemia: Secondary | ICD-10-CM | POA: Diagnosis not present

## 2022-03-29 DIAGNOSIS — M792 Neuralgia and neuritis, unspecified: Secondary | ICD-10-CM

## 2022-03-29 DIAGNOSIS — E1042 Type 1 diabetes mellitus with diabetic polyneuropathy: Secondary | ICD-10-CM

## 2022-03-29 DIAGNOSIS — Z86718 Personal history of other venous thrombosis and embolism: Secondary | ICD-10-CM

## 2022-03-29 DIAGNOSIS — Z89512 Acquired absence of left leg below knee: Secondary | ICD-10-CM | POA: Diagnosis not present

## 2022-03-29 DIAGNOSIS — Z6837 Body mass index (BMI) 37.0-37.9, adult: Secondary | ICD-10-CM

## 2022-03-29 DIAGNOSIS — Z9981 Dependence on supplemental oxygen: Secondary | ICD-10-CM

## 2022-03-29 DIAGNOSIS — Z833 Family history of diabetes mellitus: Secondary | ICD-10-CM

## 2022-03-29 DIAGNOSIS — R739 Hyperglycemia, unspecified: Secondary | ICD-10-CM | POA: Diagnosis not present

## 2022-03-29 DIAGNOSIS — G9341 Metabolic encephalopathy: Secondary | ICD-10-CM | POA: Diagnosis present

## 2022-03-29 DIAGNOSIS — G43709 Chronic migraine without aura, not intractable, without status migrainosus: Secondary | ICD-10-CM

## 2022-03-29 DIAGNOSIS — I2510 Atherosclerotic heart disease of native coronary artery without angina pectoris: Secondary | ICD-10-CM | POA: Diagnosis present

## 2022-03-29 DIAGNOSIS — Z79899 Other long term (current) drug therapy: Secondary | ICD-10-CM

## 2022-03-29 DIAGNOSIS — Z1810 Retained metal fragments, unspecified: Secondary | ICD-10-CM

## 2022-03-29 DIAGNOSIS — R109 Unspecified abdominal pain: Secondary | ICD-10-CM | POA: Diagnosis not present

## 2022-03-29 DIAGNOSIS — Z743 Need for continuous supervision: Secondary | ICD-10-CM | POA: Diagnosis not present

## 2022-03-29 HISTORY — DX: Accidental discharge from unspecified firearms or gun, initial encounter: W34.00XA

## 2022-03-29 HISTORY — DX: Encephalopathy, unspecified: G93.40

## 2022-03-29 LAB — BASIC METABOLIC PANEL
Anion gap: 6 (ref 5–15)
BUN: 23 mg/dL — ABNORMAL HIGH (ref 6–20)
CO2: 27 mmol/L (ref 22–32)
Calcium: 11.4 mg/dL — ABNORMAL HIGH (ref 8.9–10.3)
Chloride: 108 mmol/L (ref 98–111)
Creatinine, Ser: 0.96 mg/dL (ref 0.44–1.00)
GFR, Estimated: >60 mL/min (ref >60)
Glucose, Bld: 226 mg/dL — ABNORMAL HIGH (ref 70–99)
Potassium: 4.3 mmol/L (ref 3.5–5.1)
Sodium: 141 mmol/L (ref 135–145)

## 2022-03-29 LAB — URINALYSIS, ROUTINE W REFLEX MICROSCOPIC
Bilirubin Urine: NEGATIVE
Glucose, UA: 150 mg/dL — AB
Hgb urine dipstick: NEGATIVE
Ketones, ur: NEGATIVE mg/dL
Leukocytes,Ua: NEGATIVE
Nitrite: NEGATIVE
Protein, ur: >=300 mg/dL — AB
Specific Gravity, Urine: 1.011 (ref 1.005–1.030)
pH: 8 (ref 5.0–8.0)

## 2022-03-29 LAB — CBC WITH DIFFERENTIAL/PLATELET
Abs Immature Granulocytes: 0.03 10*3/uL (ref 0.00–0.07)
Basophils Absolute: 0.1 10*3/uL (ref 0.0–0.1)
Basophils Relative: 1 %
Eosinophils Absolute: 0.3 10*3/uL (ref 0.0–0.5)
Eosinophils Relative: 4 %
HCT: 39.8 % (ref 36.0–46.0)
Hemoglobin: 12.4 g/dL (ref 12.0–15.0)
Immature Granulocytes: 0 %
Lymphocytes Relative: 16 %
Lymphs Abs: 1.2 10*3/uL (ref 0.7–4.0)
MCH: 24.4 pg — ABNORMAL LOW (ref 26.0–34.0)
MCHC: 31.2 g/dL (ref 30.0–36.0)
MCV: 78.2 fL — ABNORMAL LOW (ref 80.0–100.0)
Monocytes Absolute: 0.5 10*3/uL (ref 0.1–1.0)
Monocytes Relative: 7 %
Neutro Abs: 5.3 10*3/uL (ref 1.7–7.7)
Neutrophils Relative %: 72 %
Platelets: 161 10*3/uL (ref 150–400)
RBC: 5.09 MIL/uL (ref 3.87–5.11)
RDW: 19.1 % — ABNORMAL HIGH (ref 11.5–15.5)
WBC: 7.3 10*3/uL (ref 4.0–10.5)
nRBC: 0 % (ref 0.0–0.2)

## 2022-03-29 LAB — BLOOD GAS, VENOUS
Acid-Base Excess: 2.5 mmol/L — ABNORMAL HIGH (ref 0.0–2.0)
Bicarbonate: 27.9 mmol/L (ref 20.0–28.0)
O2 Saturation: 79.7 %
Patient temperature: 37
pCO2, Ven: 45 mmHg (ref 44–60)
pH, Ven: 7.4 (ref 7.25–7.43)
pO2, Ven: 46 mmHg — ABNORMAL HIGH (ref 32–45)

## 2022-03-29 LAB — HEPATIC FUNCTION PANEL
ALT: 12 U/L (ref 0–44)
AST: 23 U/L (ref 15–41)
Albumin: 2.8 g/dL — ABNORMAL LOW (ref 3.5–5.0)
Alkaline Phosphatase: 118 U/L (ref 38–126)
Bilirubin, Direct: 0.4 mg/dL — ABNORMAL HIGH (ref 0.0–0.2)
Indirect Bilirubin: 0.9 mg/dL (ref 0.3–0.9)
Total Bilirubin: 1.3 mg/dL — ABNORMAL HIGH (ref 0.3–1.2)
Total Protein: 8.1 g/dL (ref 6.5–8.1)

## 2022-03-29 LAB — GLUCOSE, CAPILLARY: Glucose-Capillary: 197 mg/dL — ABNORMAL HIGH (ref 70–99)

## 2022-03-29 LAB — SARS CORONAVIRUS 2 BY RT PCR: SARS Coronavirus 2 by RT PCR: NEGATIVE

## 2022-03-29 LAB — TROPONIN I (HIGH SENSITIVITY)
Troponin I (High Sensitivity): 7 ng/L (ref <18)
Troponin I (High Sensitivity): 7 ng/L (ref <18)

## 2022-03-29 LAB — CBG MONITORING, ED: Glucose-Capillary: 226 mg/dL — ABNORMAL HIGH (ref 70–99)

## 2022-03-29 MED ORDER — ENOXAPARIN SODIUM 60 MG/0.6ML IJ SOSY
0.5000 mg/kg | PREFILLED_SYRINGE | INTRAMUSCULAR | Status: DC
  Administered 2022-03-29 – 2022-04-01 (×4): 50 mg via SUBCUTANEOUS
  Filled 2022-03-29 (×4): qty 0.6

## 2022-03-29 MED ORDER — TRAZODONE HCL 50 MG PO TABS
200.0000 mg | ORAL_TABLET | Freq: Every day | ORAL | Status: DC
  Administered 2022-03-29: 200 mg via ORAL
  Filled 2022-03-29: qty 4

## 2022-03-29 MED ORDER — SPIRONOLACTONE 25 MG PO TABS
50.0000 mg | ORAL_TABLET | Freq: Every day | ORAL | Status: DC
  Administered 2022-03-29 – 2022-04-02 (×4): 50 mg via ORAL
  Filled 2022-03-29 (×5): qty 2

## 2022-03-29 MED ORDER — TOPIRAMATE 100 MG PO TABS
100.0000 mg | ORAL_TABLET | Freq: Every day | ORAL | Status: DC
  Administered 2022-03-29: 100 mg via ORAL
  Filled 2022-03-29 (×2): qty 1

## 2022-03-29 MED ORDER — ATORVASTATIN CALCIUM 20 MG PO TABS
10.0000 mg | ORAL_TABLET | Freq: Every day | ORAL | Status: DC
  Administered 2022-03-29: 10 mg via ORAL
  Filled 2022-03-29 (×2): qty 1

## 2022-03-29 MED ORDER — ASPIRIN 81 MG PO TBEC
81.0000 mg | DELAYED_RELEASE_TABLET | Freq: Every day | ORAL | Status: DC
  Administered 2022-03-30 – 2022-04-02 (×4): 81 mg via ORAL
  Filled 2022-03-29 (×5): qty 1

## 2022-03-29 MED ORDER — MOMETASONE FURO-FORMOTEROL FUM 100-5 MCG/ACT IN AERO
2.0000 | INHALATION_SPRAY | Freq: Two times a day (BID) | RESPIRATORY_TRACT | Status: DC
  Administered 2022-03-31 – 2022-04-02 (×4): 2 via RESPIRATORY_TRACT
  Filled 2022-03-29 (×2): qty 8.8

## 2022-03-29 MED ORDER — ALPRAZOLAM 0.5 MG PO TABS: 0.5000 mg | ORAL_TABLET | Freq: Four times a day (QID) | ORAL | Status: DC | PRN

## 2022-03-29 MED ORDER — ANASTROZOLE 1 MG PO TABS
1.0000 mg | ORAL_TABLET | Freq: Every day | ORAL | Status: DC
  Administered 2022-03-29 – 2022-03-31 (×2): 1 mg via ORAL
  Filled 2022-03-29 (×4): qty 1

## 2022-03-29 MED ORDER — INSULIN GLARGINE-YFGN 100 UNIT/ML ~~LOC~~ SOLN
24.0000 [IU] | Freq: Every day | SUBCUTANEOUS | Status: DC
  Administered 2022-03-30: 24 [IU] via SUBCUTANEOUS
  Filled 2022-03-29 (×2): qty 0.24

## 2022-03-29 MED ORDER — SODIUM CHLORIDE 0.9 % IV BOLUS
1000.0000 mL | Freq: Once | INTRAVENOUS | Status: AC
  Administered 2022-03-29: 1000 mL via INTRAVENOUS

## 2022-03-29 MED ORDER — IOHEXOL 300 MG/ML  SOLN
100.0000 mL | Freq: Once | INTRAMUSCULAR | Status: AC | PRN
  Administered 2022-03-29: 100 mL via INTRAVENOUS

## 2022-03-29 MED ORDER — ACETAMINOPHEN 325 MG PO TABS
650.0000 mg | ORAL_TABLET | ORAL | Status: DC | PRN
  Administered 2022-04-01: 650 mg via ORAL
  Filled 2022-03-29 (×2): qty 2

## 2022-03-29 MED ORDER — CITALOPRAM HYDROBROMIDE 20 MG PO TABS
20.0000 mg | ORAL_TABLET | Freq: Every day | ORAL | Status: DC
  Administered 2022-03-29: 20 mg via ORAL
  Filled 2022-03-29 (×2): qty 1

## 2022-03-29 MED ORDER — OMADACYCLINE TOSYLATE 150 MG PO TABS: 1.0000 | ORAL_TABLET | Freq: Every day | ORAL | Status: DC

## 2022-03-29 MED ORDER — INSULIN ASPART 100 UNIT/ML IJ SOLN
0.0000 [IU] | Freq: Three times a day (TID) | INTRAMUSCULAR | Status: DC
  Administered 2022-03-30 (×2): 5 [IU] via SUBCUTANEOUS
  Administered 2022-03-30: 3 [IU] via SUBCUTANEOUS
  Administered 2022-03-31: 2 [IU] via SUBCUTANEOUS
  Administered 2022-03-31: 5 [IU] via SUBCUTANEOUS
  Administered 2022-03-31 – 2022-04-01 (×2): 3 [IU] via SUBCUTANEOUS
  Administered 2022-04-01 – 2022-04-02 (×4): 2 [IU] via SUBCUTANEOUS
  Filled 2022-03-29 (×11): qty 1

## 2022-03-29 MED ORDER — LORAZEPAM 0.5 MG PO TABS: 0.5000 mg | ORAL_TABLET | Freq: Four times a day (QID) | ORAL | Status: DC | PRN

## 2022-03-29 MED ORDER — VENLAFAXINE HCL ER 75 MG PO CP24
150.0000 mg | ORAL_CAPSULE | Freq: Every day | ORAL | Status: DC
  Filled 2022-03-29: qty 2

## 2022-03-29 MED ORDER — ONDANSETRON HCL 4 MG PO TABS: 4.0000 mg | ORAL_TABLET | Freq: Three times a day (TID) | ORAL | Status: DC | PRN

## 2022-03-29 MED ORDER — SODIUM CHLORIDE 0.9 % IV SOLN: INTRAVENOUS | Status: DC

## 2022-03-29 MED ORDER — OXYCODONE HCL 5 MG PO TABS: 5.0000 mg | ORAL_TABLET | Freq: Four times a day (QID) | ORAL | Status: DC | PRN

## 2022-03-29 MED ORDER — PREGABALIN 50 MG PO CAPS
100.0000 mg | ORAL_CAPSULE | Freq: Two times a day (BID) | ORAL | Status: DC
  Administered 2022-03-29: 100 mg via ORAL
  Filled 2022-03-29 (×2): qty 2

## 2022-03-29 MED ORDER — ROPINIROLE HCL 1 MG PO TABS
1.0000 mg | ORAL_TABLET | Freq: Every day | ORAL | Status: DC
  Administered 2022-03-29 – 2022-04-01 (×4): 1 mg via ORAL
  Filled 2022-03-29 (×5): qty 1

## 2022-03-29 MED ORDER — ALBUTEROL SULFATE HFA 108 (90 BASE) MCG/ACT IN AERS: 2.0000 | INHALATION_SPRAY | Freq: Four times a day (QID) | RESPIRATORY_TRACT | Status: DC | PRN

## 2022-03-29 MED ORDER — BUSPIRONE HCL 15 MG PO TABS
30.0000 mg | ORAL_TABLET | Freq: Two times a day (BID) | ORAL | Status: DC
  Administered 2022-03-29: 30 mg via ORAL
  Filled 2022-03-29 (×3): qty 2

## 2022-03-29 NOTE — H&P (Signed)
History and Physical    [name] TML:[id number] DOB: [birth year] DOA: 03/29/2022  PCP: Pleas Koch, NP (Confirm with patient/family/NH records and if not entered, this has to be entered at [facility] point of entry) Patient coming from: Home  I have personally briefly reviewed patient's old medical records in [place]  Chief Complaint: Slurred speech, confusion, generalized weakness.  HPI: [name] is a 51 y.o. female with medical history significant of breast cancer status post right mastectomy on hormone therapy, PVD status post bilateral BKA with chronic left infectious ulcer on BKA stump on chronic antibiotics, chronic HFpEF, CAD, IIDM, bipolar disorder, asthma/COPD, migraines headache, obesity, presented with altered mentations.  Symptoms started Saturday/4 days ago, and patient developed episodic of confusion at that time and lethargy.  She also developed worsening of baseline cardiac but denies any neck pain, no vision problems no fever or chills.  Family also noticed that patient's speech became slow and slurred since Saturday, patient denies any numbness weakness of any of the limbs.  Denies any cough, no urinary symptoms no diarrhea.  She does have a chronic unhealing ulcer on left BKA stump, for which she has been following with infection disease and she has been on chronic p.o. antibiotics   ED Course: Patient remained confused, blood pressure slightly elevated, afebrile, no tachycardia no hypotension no hypoxia.  Chest x-ray clear, CT head negative for acute findings.  Calcium 11.4, albumin 2.8, creatinine 0.9, glucose 226.  Review of Systems: Unable to perform, patient confused.  Past Medical History:  Diagnosis Date   Anxiety    takes Prozac daily   Anxiety    Aortic valve calcification    Asthma    Advair and Spirva daily   Asthma    Bipolar disorder (HCC)    Breast cancer, right breast ([place]) 12/23/2019   CAD (coronary artery disease)    a. LHC 11/2013  done for CP/fluid retention: mild disease in prox LAD, mild-mod disease in mRCA, EF 60% with normal LVEDP. b. Normal nuc 03/2016.   Cancer ([place])    Cellulitis and abscess of trunk 03/12/2020   CHF (congestive heart failure) (HCC)    Chronic diastolic CHF (congestive heart failure) (HCC)    Chronic heart failure with preserved ejection fraction ([place]) 11/16/2018   CKD (chronic kidney disease), stage II    COPD (chronic obstructive pulmonary disease) (HCC)    a. nocturnal O2.   COPD (chronic obstructive pulmonary disease) (HCC)    Coronary artery disease    Decreased urine stream    Diabetes mellitus    Diabetes mellitus without complication (HCC)    Dyspnea    Elevated C-reactive protein (CRP) 01/29/2018   Elevated sedimentation rate 01/29/2018   Elevated serum hCG 01/19/2018   Family history of adverse reaction to anesthesia    mom gets nauseated   Foot pain, bilateral 05/22/2018   GERD (gastroesophageal reflux disease)    takes Pepcid daily   History of blood clots    left leg 3-65yr ago   Hyperlipidemia    Hypertension    Hypertriglyceridemia    Inguinal hernia, left 01/2015   Muscle spasm    Open wound of genital labia    Peripheral neuropathy    Rash and nonspecific skin eruption consistent with small vessel vasculitis, Ddx includes embolic cause  56/02/1274  RBBB    [place] 01/19[place]   Sinus tachycardia    a. persistent since 2009.   Smokers' cough (  [place])    Status post mastectomy, right 01/14/2020   Stroke [facility]) 1989   left sided weakness   TIA (transient ischemic attack)    Tobacco abuse    Vulvar abscess 01/23/2018    Past Surgical History:  Procedure Laterality Date   AMPUTATION Left 10/11/2021   Procedure: AMPUTATION BELOW KNEE;  Surgeon: Algernon Huxley, MD;  Location: [facility];  Service: General;  Laterality: Left;   AMPUTATION Right 11/15/2021   Procedure: BELOW KNEE AMPUTATION;  Surgeon: Katha Cabal, MD;  Location: [facility];  Service:  Vascular;  Laterality: Right;   APPLICATION OF WOUND VAC Right 01/29/2020   Procedure: APPLICATION OF WOUND VAC;  Surgeon: Ronny Bacon, MD;  Location: [facility];  Service: General;  Laterality: Right;   APPLICATION OF WOUND VAC Left 11/15/2021   Procedure: APPLICATION OF WOUND VAC;  Surgeon: Katha Cabal, MD;  Location: [facility];  Service: Vascular;  Laterality: Left;   BREAST BIOPSY Right 11/06/2019   [place] core path pending venus clip   GRAFT APPLICATION  1/61/0960   Procedure: GRAFT APPLICATION;  Surgeon: [name], DPM;  Location: [facility];  Service: Podiatry;;   HERNIA REPAIR Left    I & D EXTREMITY Left 11/05/2021   Procedure: IRRIGATION AND DEBRIDEMENT LEFT BELOW KNEE STUMP;  Surgeon: Katha Cabal, MD;  Location: [facility];  Service: Vascular;  Laterality: Left;   INCISION AND DRAINAGE Bilateral 09/21/2021   Procedure: INCISION AND DRAINAGE;  Surgeon: [name], DPM;  Location: [facility];  Service: Podiatry;  Laterality: Bilateral;   INCISION AND DRAINAGE ABSCESS N/A 01/26/2018   Procedure: INCISION AND DEBRIDEMENT OF VULVAR NECROTIZING SOFT TISSUE INFECTION;  Surgeon: Greer Pickerel, MD;  Location: [place];  Service: General;  Laterality: N/A;   INCISION AND DRAINAGE ABSCESS N/A 07/21/2021   Procedure: INCISION AND DRAINAGE ABSCESS Scalp;  Surgeon: Robert Bellow, MD;  Location: [place] [facility];  Service: General;  Laterality: N/A;  scalp and right abdomen   INCISION AND DRAINAGE PERIRECTAL ABSCESS N/A 01/22/2018   Procedure: IRRIGATION AND DEBRIDEMENT LABIAL/VULVAR AREA;  Surgeon: Coralie Keens, MD;  Location: [place];  Service: General;  Laterality: N/A;   INCISION AND DRAINAGE PERIRECTAL ABSCESS N/A 01/29/2018   Procedure: IRRIGATION AND DEBRIDEMENT VULVA;  Surgeon: Excell Seltzer, MD;  Location: [place];  Service: General;  Laterality: N/A;   INGUINAL HERNIA REPAIR Left 04/08/2015   Procedure: OPEN LEFT INGUINAL HERNIA REPAIR WITH MESH;  Surgeon: Ralene Ok, MD;   Location: [place];  Service: General;  Laterality: Left;   INSERTION OF MESH Left 04/08/2015   Procedure: INSERTION OF MESH;  Surgeon: Ralene Ok, MD;  Location: [place];  Service: General;  Laterality: Left;   IRRIGATION AND DEBRIDEMENT ABSCESS N/A 07/21/2021   Procedure: IRRIGATION AND DEBRIDEMENT ABSCESS abdomen;  Surgeon: Robert Bellow, MD;  Location: [facility];  Service: General;  Laterality: N/A;   LAPAROSCOPY     Endometriosis   LEFT HEART CATHETERIZATION WITH CORONARY ANGIOGRAM N/A 12/05/2013   Procedure: LEFT HEART CATHETERIZATION WITH CORONARY ANGIOGRAM;  Surgeon: Jettie Booze, MD;  Location: [facility] CATH LAB;  Service: Cardiovascular;  Laterality: N/A;   LOWER EXTREMITY ANGIOGRAPHY Left 09/22/2021   Procedure: Lower Extremity Angiography;  Surgeon: Katha Cabal, MD;  Location: [place] CV LAB;  Service: Cardiovascular;  Laterality: Left;   LOWER EXTREMITY ANGIOGRAPHY Right 11/09/2021   Procedure: Lower Extremity Angiography;  Surgeon: Katha Cabal, MD;  Location: [place] CV LAB;  Service: Cardiovascular;  Laterality: Right;   MASTECTOMY Right    MINOR APPLICATION OF WOUND VAC Left 11/05/2021   Procedure: MINOR APPLICATION OF WOUND VAC;  Surgeon: Katha Cabal, MD;  Location: [facility];  Service: Vascular;  Laterality: Left;   right kidney drained     SIMPLE MASTECTOMY WITH AXILLARY SENTINEL NODE BIOPSY Right 12/23/2019   Procedure: Right SIMPLE MASTECTOMY WITH AXILLARY SENTINEL NODE BIOPSY;  Surgeon: Ronny Bacon, MD;  Location: [facility];  Service: General;  Laterality: Right;   TEE WITHOUT CARDIOVERSION N/A 11/28/2013   Procedure: TRANSESOPHAGEAL ECHOCARDIOGRAM (TEE);  Surgeon: Thayer Headings, MD;  Location: [place];  Service: Cardiovascular;  Laterality: N/A;   TEE WITHOUT CARDIOVERSION N/A 10/13/2021   Procedure: TRANSESOPHAGEAL ECHOCARDIOGRAM (TEE);  Surgeon: Corey Skains, MD;  Location: [facility];  Service: Cardiovascular;  Laterality: N/A;    WOUND DEBRIDEMENT Right 01/29/2020   Procedure: DEBRIDEMENT WOUND, Excisional debridement skin, subcutaneous and muscle right chest wall;  Surgeon: Ronny Bacon, MD;  Location: [facility];  Service: General;  Laterality: Right;     reports that she has been smoking cigarettes. She started smoking about 40 years ago. She has a 9.00 pack-year smoking history. She has never used smokeless tobacco. She reports that she does not drink alcohol and does not use drugs.  Allergies  Allergen Reactions   Adhesive [Tape] Rash and Other (See Comments)    TAKES OFF THE SKIN (CERTAIN MEDICAL TAPES DO THIS!!)   Metoprolol Shortness Of Breath    Occurrence of shortness of breath after 3 days   Montelukast Shortness Of Breath   Morphine Sulfate Anaphylaxis, Shortness Of Breath and Nausea And Vomiting    Swollen Throat - Able to tolerate dilaudid   Penicillins Anaphylaxis, Hives and Shortness Of Breath    Throat swells Has patient had a PCN reaction causing immediate rash, facial/tongue/throat swelling, SOB or lightheadedness with hypotension: Yes Has patient had a PCN reaction causing severe rash involving mucus membranes or skin necrosis: No Has patient had a PCN reaction that required hospitalization: Yes Has patient had a PCN reaction occurring within the last 10 years: No If all of the above answers are "NO", then may proceed with Cephalosporin use.    Silicone Rash    Takes off the skin (certain medical tapes do this)   Diltiazem Swelling   Gabapentin Swelling   Midodrine     Lightheaded and falling down   Percocet [Oxycodone-Acetaminophen] Rash    Family History  Problem Relation Age of Onset   Venous thrombosis Brother    Other Brother        BRAIN TUMOR   Asthma Father    Diabetes Father    Coronary artery disease Mother    Hypertension Mother    Diabetes Mother    Breast cancer Mother 67   Asthma Sister    Diabetes type II Brother    Diabetes Brother     Prior to Admission  medications   Medication Sig Start Date End Date Taking? Authorizing Provider  acetaminophen (TYLENOL) 325 MG tablet Take 2 tablets (650 mg total) by mouth every 4 (four) hours as needed for headache or mild pain. 09/23/21  Yes Dwyane Dee, MD  albuterol (VENTOLIN HFA) 108 (90 Base) MCG/ACT inhaler Inhale 2 puffs into the lungs every 6 (six) hours as needed for wheezing or shortness of breath.   Yes [provider]  ALPRAZolam Duanne Moron) 1 MG tablet Take 1 tablet (1 mg total) by mouth 4 (four) times daily as  needed for anxiety. 10/14/21  Yes [name], MD  anastrozole (ARIMIDEX) 1 MG tablet Take 1 tablet (1 mg total) by mouth daily. 02/03/22  Yes Verlon Au, NP  aspirin EC 81 MG tablet Take 81 mg by mouth daily before breakfast.   Yes [provider]  atorvastatin (LIPITOR) 10 MG tablet Take 1 tablet (10 mg total) by mouth daily. For cholesterol 03/08/22  Yes Pleas Koch, NP  budesonide-formoterol (SYMBICORT) 80-4.5 MCG/ACT inhaler INHALE 2 PUFFS BY MOUTH EVERY 12 HOURS TO PREVENT COUGH OR WHEEZING *RINSE MOUTH AFTER EACH USE* 12/01/20  Yes Pleas Koch, NP  busPIRone (BUSPAR) 30 MG tablet Take 30 mg by mouth 2 (two) times daily.   Yes [provider]  citalopram (CELEXA) 20 MG tablet Take 1 tablet (20 mg total) by mouth daily. 12/17/19  Yes Cammie Sickle, MD  desvenlafaxine (PRISTIQ) 100 MG 24 hr tablet Take 100 mg by mouth daily.   Yes [provider]  insulin aspart (NOVOLOG) 100 UNIT/ML FlexPen Inject 28 units into the skin before breakfast and inject 10 units into the skin before dinner for diabetes. Patient taking differently: Inject 35 units into the skin before breakfast and inject 10 units into the skin before dinner for diabetes. 03/04/22  Yes Pleas Koch, NP  insulin glargine (LANTUS) 100 UNIT/ML Solostar Pen Inject 24 Units into the skin daily. 03/04/22  Yes Pleas Koch, NP  Omadacycline Tosylate (NUZYRA) 150 MG TABS Take  3 tablets by mouth on days 1 and 2, then take 2 tablets by mouth daily thereafter. Take on an empty stomach. 01/13/22  Yes Tsosie Billing, MD  pregabalin (LYRICA) 300 MG capsule TAKE 1 CAPSULE BY MOUTH TWICE DAILY FOR  NERVE  PAIN 10/22/21  Yes Pleas Koch, NP  rOPINIRole (REQUIP) 1 MG tablet TAKE 1 TABLET BY MOUTH ONCE DAILY AT BEDTIME FOR RESTLESS LEG 03/27/22  Yes Pleas Koch, NP  spironolactone (ALDACTONE) 50 MG tablet Take 50 mg by mouth daily. 02/16/21  Yes [provider]  topiramate (TOPAMAX) 50 MG tablet TAKE 1 TABLET BY MOUTH TWICE DAILY FOR HEADACHE PREVENTION 02/23/22  Yes Pleas Koch, NP  traZODone (DESYREL) 100 MG tablet Take 200 mg by mouth at bedtime. 11/08/18  Yes [provider]  EYLEA 2 MG/0.05ML SOSY  03/10/21   [provider]  naloxone [facility]) nasal spray 4 mg/0.1 mL SMARTSIG:Spray(s) In Nostril 01/01/22   [provider]  ondansetron (ZOFRAN) 8 MG tablet One pill every 8 hours as needed for nausea/vomitting. 09/10/21   Cammie Sickle, MD  oxyCODONE (OXY IR/ROXICODONE) 5 MG immediate release tablet Take 1 tablet (5 mg total) by mouth every 6 (six) hours as needed for severe pain. 01/28/22   Kris Hartmann, NP  OXYGEN Inhale 2-4 L into the lungs 2 (two) times daily as needed (shortness of breath).    [provider]    Physical Exam: Vitals:   03/29/22 1100 03/29/22 1130 03/29/22 1230 03/29/22 1300  BP: (!) 172/76 (!) 184/78 (!) 115/95 (!) 150/83  Pulse: 71 72 71 73  Resp: '17 15 18 17  '$ Temp:      TempSrc:      SpO2: 99% 99% 100% 94%  Weight:        Constitutional: NAD, calm, comfortable Vitals:   03/29/22 1100 03/29/22 1130 03/29/22 1230 03/29/22 1300  BP: (!) 172/76 (!) 184/78 (!) 115/95 (!) 150/83  Pulse: 71 72 71 73  Resp: 17 15  18 17  Temp:      TempSrc:      SpO2: 99% 99% 100% 94%  Weight:       Eyes: PERRL, lids and conjunctivae normal ENMT: Mucous membranes are moist. Posterior  pharynx clear of any exudate or lesions.Normal dentition.  Neck: normal, supple, no masses, no thyromegaly Respiratory: clear to auscultation bilaterally, no wheezing, no crackles. Normal respiratory effort. No accessory muscle use.  Cardiovascular: Regular rate and rhythm, no murmurs / rubs / gallops. No extremity edema. 2+ pedal pulses. No carotid bruits.  Abdomen: no tenderness, no masses palpated. No hepatosplenomegaly. Bowel sounds positive.  Musculoskeletal: no clubbing / cyanosis. No joint deformity upper and lower extremities. Good ROM, no contractures. Normal muscle tone.  Skin: no rashes, lesions, ulcers. No induration Neurologic: No facial droops, moving all limbs, following simple commands, muscle strength 4/5 on all limbs. Psychiatric: Arousable, lethargic, oriented to person and place, confused about time   Labs on Admission: I have personally reviewed following labs and imaging studies  CBC: Recent Labs  Lab 03/29/22 0930  WBC 7.3  NEUTROABS 5.3  HGB 12.4  HCT 39.8  MCV 78.2*  PLT 536   Basic Metabolic Panel: Recent Labs  Lab 03/29/22 0930  NA 141  K 4.3  CL 108  CO2 27  GLUCOSE 226*  BUN 23*  CREATININE 0.96  CALCIUM 11.4*   GFR: Estimated Creatinine Clearance: 77.9 mL/min (by C-G formula based on SCr of 0.96 mg/dL). Liver Function Tests: Recent Labs  Lab 03/29/22 1155  AST 23  ALT 12  ALKPHOS 118  BILITOT 1.3*  PROT 8.1  ALBUMIN 2.8*   No results for input(s): "LIPASE", "AMYLASE" in the last 168 hours. No results for input(s): "AMMONIA" in the last 168 hours. Coagulation Profile: No results for input(s): "INR", "PROTIME" in the last 168 hours. Cardiac Enzymes: No results for input(s): "CKTOTAL", "CKMB", "CKMBINDEX", "TROPONINI" in the last 168 hours. BNP (last 3 results) No results for input(s): "PROBNP" in the last 8760 hours. HbA1C: No results for input(s): "HGBA1C" in the last 72 hours. CBG: Recent Labs  Lab 03/29/22 1656  GLUCAP  226*   Lipid Profile: No results for input(s): "CHOL", "HDL", "LDLCALC", "TRIG", "CHOLHDL", "LDLDIRECT" in the last 72 hours. Thyroid Function Tests: No results for input(s): "TSH", "T4TOTAL", "FREET4", "T3FREE", "THYROIDAB" in the last 72 hours. Anemia Panel: No results for input(s): "VITAMINB12", "FOLATE", "FERRITIN", "TIBC", "IRON", "RETICCTPCT" in the last 72 hours. Urine analysis:    Component Value Date/Time   COLORURINE AMBER (A) 03/29/2022 0930   APPEARANCEUR TURBID (A) 03/29/2022 0930   LABSPEC 1.011 03/29/2022 0930   PHURINE 8.0 03/29/2022 0930   GLUCOSEU 150 (A) 03/29/2022 0930   HGBUR NEGATIVE 03/29/2022 0930   HGBUR negative 03/07/2008 1643   BILIRUBINUR NEGATIVE 03/29/2022 0930   BILIRUBINUR neg 04/07/2021 0837   KETONESUR NEGATIVE 03/29/2022 0930   PROTEINUR >=300 (A) 03/29/2022 0930   UROBILINOGEN 0.2 04/07/2021 0837   UROBILINOGEN 0.2 10/08/2011 1850   NITRITE NEGATIVE 03/29/2022 0930   LEUKOCYTESUR NEGATIVE 03/29/2022 0930    Radiological Exams on Admission: DG Chest 1 View  Result Date: 03/29/2022 CLINICAL DATA:  Altered mental state. EXAM: CHEST  1 VIEW COMPARISON:  November 04, 2021. FINDINGS: Stable cardiomegaly. Lungs are clear. Eventration of right hemidiaphragm is noted. Bony thorax is unremarkable. IMPRESSION: No active disease. Electronically Signed   By: Marijo Conception M.D.   On: 03/29/2022 16:30   CT ABDOMEN PELVIS W CONTRAST  Result Date: 03/29/2022 CLINICAL  DATA:  Abdominal pain. EXAM: CT ABDOMEN AND PELVIS WITH CONTRAST TECHNIQUE: Multidetector CT imaging of the abdomen and pelvis was performed using the standard protocol following bolus administration of intravenous contrast. RADIATION DOSE REDUCTION: This exam was performed according to the departmental dose-optimization program which includes automated exposure control, adjustment of the mA and/or kV according to patient size and/or use of iterative reconstruction technique. CONTRAST:  160m  OMNIPAQUE IOHEXOL 300 MG/ML  SOLN COMPARISON:  CT examination dated March 08, 2021 FINDINGS: Lower chest: Small right pleural effusion. Ground-glass opacities and fibrotic changes of the bilateral lung bases, right greater than the left. 6 mm nodule in the right lower lobe not significantly changed from prior examination. Hepatobiliary: Hepatic steatosis. No appreciable hepatic mass or focal hepatic lesion. No gallstones, gallbladder wall thickening or biliary dilatation. Pancreas: Unremarkable. No pancreatic ductal dilatation or surrounding inflammatory changes. Spleen: Normal in size without focal abnormality. Adrenals/Urinary Tract: Adrenal glands are unremarkable. Kidneys are normal, without renal calculi, focal lesion, or hydronephrosis. Bladder is unremarkable. Stomach/Bowel: Stomach is within normal limits. Appendix appears normal. No evidence of bowel wall thickening, distention, or inflammatory changes. Colonic diverticulosis prominent in the sigmoid colon without evidence of acute diverticulitis. Vascular/Lymphatic: Aortic atherosclerosis. No enlarged abdominal or pelvic lymph nodes. Reproductive: Uterus and bilateral adnexa are unremarkable. Other: No abdominal wall hernia or abnormality. No abdominopelvic ascites. Musculoskeletal: Diffuse idiopathic skeletal hyperostosis and mild thoracic kyphosis. No acute osseous abnormality. Mild subcutaneous soft tissue edema and fat stranding prominent in the right hip. IMPRESSION: 1. Fibrotic changes, small right pleural effusion and a stable 6 mm nodule in the right lower lobe. Non-contrast chest CT at 6-12 months is recommended. If the nodule is stable at time of repeat CT, then future CT at 18-24 months (from today's scan) is considered optional for low-risk patients, but is recommended for high-risk patients. This recommendation follows the consensus statement: Guidelines for Management of Incidental Pulmonary Nodules Detected on CT Images: From the Fleischner  Society 2017; Radiology 2017; 284:228-243. 2. Sigmoid colonic diverticulosis without evidence of acute diverticulitis. 3. Hepatic steatosis. 4. Aortic and branch vessel atherosclerotic calcifications. 5. Mild subcutaneous soft tissue edema and fat stranding in the subcutaneous soft tissues prominent in the right hip/gluteal region. Electronically Signed   By: IKeane PoliceD.O.   On: 03/29/2022 13:59   CT Head Wo Contrast  Result Date: 03/29/2022 CLINICAL DATA:  Weakness. EXAM: CT HEAD WITHOUT CONTRAST TECHNIQUE: Contiguous axial images were obtained from the base of the skull through the vertex without intravenous contrast. RADIATION DOSE REDUCTION: This exam was performed according to the departmental dose-optimization program which includes automated exposure control, adjustment of the mA and/or kV according to patient size and/or use of iterative reconstruction technique. COMPARISON:  CT examination dated March 03, 2021 FINDINGS: Brain: No evidence of acute infarction, hemorrhage, hydrocephalus, extra-axial collection or mass lesion/mass effect. Vascular: No hyperdense vessel or unexpected calcification. Skull: Rounded metallic density/bb marker in the left frontal scalp, unchanged. No acute fracture. Sinuses/Orbits: No acute finding. Other: None. IMPRESSION: No CT evidence of acute intracranial abnormality or significant interval change. Electronically Signed   By: IKeane PoliceD.O.   On: 03/29/2022 13:31    EKG: Independently reviewed.  Sinus rhythm, chronic RBBB, no acute ST changes.  Assessment/Plan Principal Problem:   Encephalopathy Active Problems:   Acute encephalopathy  (please populate well all problems here in Problem List. (For example, if patient is on BP meds at home and you resume or decide to hold them, it  is a problem that needs to be her. Same for CAD, COPD, HLD and so on)  Acute metabolic encephalopathy -Probably multiple factorial, suspect main reason and is no acute  hypercalcemia -As patient developed confusion, corrected calcium level 12, only mildly elevated, will treat with aggressive hydration for now, check PTH, PTH like peptide.  Repeat calcium level tomorrow -She also has concurrent slurred speech, stroke suspected however patient does have a metal foreign body in her skull, MRI contraindicated.  Will repeat CT head tomorrow patient already on aspirin and statin, check lipid panel and A1c -Other DDx, patient does take multiple anxiety depression medications, polypharmacy also suspected.  We will treat hypercalcemia and then reevaluate her medication list.  We will start from cutting down her Xanax dosage to half. -Other DDx, VBG normal no CO2 retention, UA clean no UTI, her left leg stump ulcer appears to be clean and completed antibiotics last month, no indication for restarting antibiotics at this point.  Moderate hypercalcemia -As above  IDDM, with hyperglycemia -Sliding scale for now, continue Lantus  Polypharmacy -She takes 4 different kind of SSRI SRNI, I will cut down her Xanax first, if no significant mentation improvement, consider cutting down her SSRI dosage.  Asthma/COPD -Stable, no acute symptoms or signs of acute exacerbation.  VBG normal pH and PCO2  Breast cancer on hormone therapy -Oncology consulted in the ED  Microalbuminuria -Microalbumin/creatinine> 300, in nephritis range, will need close follow-up with nephrology.  Medically she appears to be euvolemic and creatinine level normal at this point.  DVT prophylaxis: Lovenox Code Status: Full code Family Communication: Husband at bedside Disposition Plan: Expect less than 2 midnight hospital stay Consults called: Oncology Admission status: Telemetry observation   Lequita Halt MD [facility] Pager [phone]  03/29/2022, 5:01 PM

## 2022-03-29 NOTE — Telephone Encounter (Signed)
Husband called reporting that patient is on way to [place] with fever and weakness and wanted to be sure that we are aware. I see that she is in ER

## 2022-03-29 NOTE — ED Triage Notes (Signed)
Pt to ED [facility] from home for increased lethargy/weakness for past 2 days. Increased urination, possible fever. Pt has ovarian and breast cancer. Oriented x4.  Pt reports pain to back and buttocks, states has sores on buttocks that are being treated.  Pt has bilateral below knee amputations

## 2022-03-29 NOTE — Progress Notes (Signed)
Patient arrived to unit from ER via stretcher in stable condition. Patient alert and oriented, with significantly delayed responses. Stage II pressure ulcers noted to bilat buttocks measuring as follows:  Right Buttock 1 x 2 x 0.2cm Left Buttock 1 x 1 x 0.2cm  Ulcer of unknown origin noted to left BKA stump.  Surrounding tissue reddened, but blanchable.   Foam dressings applied.  Patient oriented to unit and room. Will provide report to on-coming shift.

## 2022-03-29 NOTE — ED Notes (Signed)
Areas of broken skin noted to buttocks. Pt with cream on areas upon arrival

## 2022-03-29 NOTE — ED Provider Notes (Signed)
[facility] Provider Note    Event Date/Time   First MD Initiated Contact with Patient 03/29/22 [phone]     (approximate)   History   Weakness   HPI  [name] is a 51 y.o. female  who presents to the emergency department today because of concern for weakness. She has had generalized weakness. Started two days ago. This has been accompanied by some discomfort in her upper abdomen. She has had decreased appetite. Also complaining of pain to her buttocks where she has known bed sores. The patient denies any fevers. Denies any chest pain. Denies any urinary symptoms.        Physical Exam   Triage Vital Signs: ED Triage Vitals  Enc Vitals Group     BP 03/29/22 0921 (!) 176/73     Pulse Rate 03/29/22 0921 66     Resp 03/29/22 0921 14     Temp 03/29/22 0921 98 F (36.7 C)     Temp Source 03/29/22 0921 Oral     SpO2 03/29/22 0921 97 %     Weight 03/29/22 0918 220 lb 7.4 oz (100 kg)     Height --      Head Circumference --      Peak Flow --      Pain Score 03/29/22 0917 7     Pain Loc --      Pain Edu? --      Excl. in [place]? --     Most recent vital signs: Vitals:   03/29/22 0921  BP: (!) 176/73  Pulse: 66  Resp: 14  Temp: 98 F (36.7 C)  SpO2: 97%    General: Somnolent, awakens to verbal stimuli, oriented. CV:  Good peripheral perfusion. Regular rate and rhythm. Resp:  Normal effort. Lungs clear.  Abd:  No distention.     ED Results / Procedures / Treatments   Labs (all labs ordered are listed, but only abnormal results are displayed) Labs Reviewed  CBC WITH DIFFERENTIAL/PLATELET - Abnormal; Notable for the following components:      Result Value   MCV 78.2 (*)    MCH 24.4 (*)    RDW 19.1 (*)    All other components within normal limits  BASIC METABOLIC PANEL - Abnormal; Notable for the following components:   Glucose, Bld 226 (*)    BUN 23 (*)    Calcium 11.4 (*)    All other components within normal limits  URINALYSIS,  ROUTINE W REFLEX MICROSCOPIC - Abnormal; Notable for the following components:   Color, Urine AMBER (*)    APPearance TURBID (*)    Glucose, UA 150 (*)    Protein, ur >=300 (*)    Bacteria, UA RARE (*)    All other components within normal limits  SARS CORONAVIRUS 2 BY RT PCR  CULTURE, BLOOD (ROUTINE X 2)  CULTURE, BLOOD (ROUTINE X 2)  TROPONIN I (HIGH SENSITIVITY)  TROPONIN I (HIGH SENSITIVITY)     EKG  I, Nance Pear, attending physician, personally viewed and interpreted this EKG  EKG Time: 0921 Rate: 66 Rhythm: sinus rhythm Axis: left axis deviation Intervals: qtc 411 QRS: RBBB,LAFB ST changes: no st elevation Impression: abnormal ekg   RADIOLOGY I independently interpreted and visualized the ct head. My interpretation: No acute bleed Radiology interpretation:  IMPRESSION:  No CT evidence of acute intracranial abnormality or significant  interval change.    I independently interpreted and visualized the ct abd/pel. My interpretation: No free air  Radiology interpretation:  IMPRESSION:  1. Fibrotic changes, small right pleural effusion and a stable 6 mm  nodule in the right lower lobe. Non-contrast chest CT at 6-12 months  is recommended. If the nodule is stable at time of repeat CT, then  future CT at 18-24 months (from today's scan) is considered optional  for low-risk patients, but is recommended for high-risk patients.  This recommendation follows the consensus statement: Guidelines for  Management of Incidental Pulmonary Nodules Detected on CT Images:  From the Fleischner Society 2017; Radiology 2017; 284:228-243.  2. Sigmoid colonic diverticulosis without evidence of acute  diverticulitis.  3. Hepatic steatosis.  4. Aortic and branch vessel atherosclerotic calcifications.  5. Mild subcutaneous soft tissue edema and fat stranding in the  subcutaneous soft tissues prominent in the right hip/gluteal region.     PROCEDURES:  Critical Care performed:  No  Procedures   MEDICATIONS ORDERED IN ED: Medications - No data to display   IMPRESSION / MDM / [place] / ED COURSE  I reviewed the triage vital signs and the nursing notes.                              Differential diagnosis includes, but is not limited to, infection, anemia.  Patient's presentation is most consistent with acute presentation with potential threat to life or bodily function.  Patient presented to the emergency department today because of concerns for weakness that is gotten worse over the past 3 days.  Initial exam patient was quite somnolent.  Broad work-up was initiated.  Did have concern for infection, anemia, electrolyte abnormality.  No clear etiology on blood work.  Did get CT scans of the head and abdomen pelvis which again did not show any clear etiology of the weakness.  Patient was given IV fluids here in the emergency department and did improve.  However given profound weakness I do think patient would benefit from further work-up and management.  Discussed with the hospitalist service will plan on admission.  FINAL CLINICAL IMPRESSION(S) / ED DIAGNOSES   Final diagnoses:  Weakness     Note:  This document was prepared using Dragon voice recognition software and may include unintentional dictation errors.    Nance Pear, MD 03/29/22 [phone]

## 2022-03-30 ENCOUNTER — Telehealth: Payer: Self-pay | Admitting: Primary Care

## 2022-03-30 ENCOUNTER — Encounter: Payer: Self-pay | Admitting: Internal Medicine

## 2022-03-30 ENCOUNTER — Observation Stay: Payer: BC Managed Care – PPO

## 2022-03-30 DIAGNOSIS — I672 Cerebral atherosclerosis: Secondary | ICD-10-CM | POA: Diagnosis not present

## 2022-03-30 DIAGNOSIS — G9340 Encephalopathy, unspecified: Secondary | ICD-10-CM | POA: Diagnosis not present

## 2022-03-30 DIAGNOSIS — L8990 Pressure ulcer of unspecified site, unspecified stage: Secondary | ICD-10-CM | POA: Diagnosis present

## 2022-03-30 DIAGNOSIS — Z853 Personal history of malignant neoplasm of breast: Secondary | ICD-10-CM | POA: Diagnosis not present

## 2022-03-30 DIAGNOSIS — R4182 Altered mental status, unspecified: Secondary | ICD-10-CM | POA: Diagnosis not present

## 2022-03-30 DIAGNOSIS — Z8673 Personal history of transient ischemic attack (TIA), and cerebral infarction without residual deficits: Secondary | ICD-10-CM | POA: Diagnosis not present

## 2022-03-30 DIAGNOSIS — R531 Weakness: Secondary | ICD-10-CM | POA: Diagnosis not present

## 2022-03-30 LAB — GLUCOSE, CAPILLARY
Glucose-Capillary: 213 mg/dL — ABNORMAL HIGH (ref 70–99)
Glucose-Capillary: 167 mg/dL — ABNORMAL HIGH (ref 70–99)
Glucose-Capillary: 210 mg/dL — ABNORMAL HIGH (ref 70–99)
Glucose-Capillary: 172 mg/dL — ABNORMAL HIGH (ref 70–99)

## 2022-03-30 LAB — BASIC METABOLIC PANEL
Anion gap: 4 — ABNORMAL LOW (ref 5–15)
BUN: 18 mg/dL (ref 6–20)
CO2: 24 mmol/L (ref 22–32)
Calcium: 11.4 mg/dL — ABNORMAL HIGH (ref 8.9–10.3)
Chloride: 111 mmol/L (ref 98–111)
Creatinine, Ser: 0.68 mg/dL (ref 0.44–1.00)
GFR, Estimated: >60 mL/min (ref >60)
Glucose, Bld: 186 mg/dL — ABNORMAL HIGH (ref 70–99)
Potassium: 3.5 mmol/L (ref 3.5–5.1)
Sodium: 139 mmol/L (ref 135–145)

## 2022-03-30 LAB — VITAMIN B12: Vitamin B-12: 307 pg/mL (ref 180–914)

## 2022-03-30 LAB — T4, FREE: Free T4: 0.79 ng/dL (ref 0.61–1.12)

## 2022-03-30 LAB — AMMONIA: Ammonia: 46 umol/L — ABNORMAL HIGH (ref 9–35)

## 2022-03-30 LAB — HIV ANTIBODY (ROUTINE TESTING W REFLEX): HIV Screen 4th Generation wRfx: NONREACTIVE

## 2022-03-30 LAB — TSH: TSH: 0.879 u[IU]/mL (ref 0.350–4.500)

## 2022-03-30 MED ORDER — INSULIN GLARGINE-YFGN 100 UNIT/ML ~~LOC~~ SOLN
15.0000 [IU] | Freq: Every day | SUBCUTANEOUS | Status: DC
  Administered 2022-03-31 – 2022-04-02 (×3): 15 [IU] via SUBCUTANEOUS
  Filled 2022-03-30 (×4): qty 0.15

## 2022-03-30 MED ORDER — ONDANSETRON HCL 4 MG/2ML IJ SOLN: 4.0000 mg | Freq: Four times a day (QID) | INTRAMUSCULAR | Status: DC | PRN

## 2022-03-30 MED ORDER — ALPRAZOLAM 0.5 MG PO TABS
0.5000 mg | ORAL_TABLET | Freq: Three times a day (TID) | ORAL | Status: DC | PRN
  Administered 2022-03-31 – 2022-04-01 (×4): 0.5 mg via ORAL
  Filled 2022-03-30 (×4): qty 1

## 2022-03-30 MED ORDER — SODIUM CHLORIDE 0.9 % IV SOLN: INTRAVENOUS | Status: DC

## 2022-03-30 MED ORDER — TOPIRAMATE 25 MG PO TABS
50.0000 mg | ORAL_TABLET | Freq: Every day | ORAL | Status: DC
  Administered 2022-03-31 – 2022-04-02 (×3): 50 mg via ORAL
  Filled 2022-03-30 (×5): qty 2

## 2022-03-30 MED ORDER — TRAZODONE HCL 50 MG PO TABS: 100.0000 mg | ORAL_TABLET | Freq: Every day | ORAL | Status: DC

## 2022-03-30 MED ORDER — INSULIN GLARGINE-YFGN 100 UNIT/ML ~~LOC~~ SOLN: 15.0000 [IU] | Freq: Every day | SUBCUTANEOUS | Status: DC

## 2022-03-30 MED ORDER — PREGABALIN 50 MG PO CAPS
100.0000 mg | ORAL_CAPSULE | Freq: Two times a day (BID) | ORAL | Status: DC
  Administered 2022-03-30 – 2022-04-02 (×6): 100 mg via ORAL
  Filled 2022-03-30 (×6): qty 2

## 2022-03-30 NOTE — Hospital Course (Addendum)
[name] is a 51 y.o. female with medical history significant of breast cancer status post right mastectomy on hormone therapy, PVD status post bilateral BKA with chronic left infectious ulcer on BKA stump on chronic antibiotics, chronic HFpEF, CAD, IIDM, bipolar disorder, asthma/COPD, migraines headache, obesity, presented with confusion. Reported symptoms started on Saturday evening progressed to Monday when patient becomes completely unresponsive.  Still not back to baseline.  Husband reports visual hallucination.  Mentation improved significantly on 9/22 but calcium level remains elevated and trending up therefore given Zometa.

## 2022-03-30 NOTE — Telephone Encounter (Signed)
Noted.  I am already aware, but I appreciate the update.

## 2022-03-30 NOTE — Progress Notes (Signed)
Eeg done 

## 2022-03-30 NOTE — Plan of Care (Signed)
  Problem: Education: Goal: Ability to describe self-care measures that may prevent or decrease complications (Diabetes Survival Skills Education) will improve Outcome: Progressing Goal: Individualized Educational Video(s) Outcome: Progressing   Problem: Coping: Goal: Ability to adjust to condition or change in health will improve Outcome: Progressing   

## 2022-03-30 NOTE — Plan of Care (Signed)

## 2022-03-30 NOTE — Procedures (Signed)
Patient Name: [name]  MRN: [id number]  Epilepsy Attending: Lora Havens  Referring Physician/Provider: [name], MD Date: 03/30/2022 Duration: 20.49 mins  Patient history: 51yo F with ams. EEG to evaluate for seizure  Level of alertness: Awake=  AEDs during EEG study: TPM, PGB  Technical aspects: This EEG study was done with scalp electrodes positioned according to the 10-20 International system of electrode placement. Electrical activity was reviewed with band pass filter of 1-'70Hz'$ , sensitivity of 7 uV/mm, display speed of 88m/sec with a '60Hz'$  notched filter applied as appropriate. EEG data were recorded continuously and digitally stored.  Video monitoring was available and reviewed as appropriate.  Description: No posterior dominant rhythm was seen. EEG showed continuous generalized 3 to 6 Hz theta-delta slowing, at times with triphasic morphology. Physiologic photic driving was not seen during photic stimulation.  Hyperventilation was not performed.     ABNORMALITY - Continuous slow, generalized  IMPRESSION: This study is suggestive of moderate diffuse encephalopathy, nonspecific etiology. No seizures or epileptiform discharges were seen throughout the recording.  [name] OBarbra Sarks

## 2022-03-30 NOTE — Inpatient Diabetes Management (Signed)
Inpatient Diabetes Program Recommendations  AACE/ADA: New Consensus Statement on Inpatient Glycemic Control (2015)  Target Ranges:  Prepandial:   less than 140 mg/dL      Peak postprandial:   less than 180 mg/dL (1-2 hours)      Critically ill patients:  140 - 180 mg/dL   Lab Results  Component Value Date   GLUCAP 167 (H) 03/30/2022   HGBA1C 10.3 (A) 03/04/2022    Review of Glycemic Control  Diabetes history: DM2 Outpatient Diabetes medications: Lantus 24 units QD, Novolog 35 units ac breakfast and 10 units ac supper Current orders for Inpatient glycemic control: Semglee 15 QD, Novolog 0-15 TID with meals  Hgba1C - 10.3% Received Semglee 24 units this am.  Inpatient Diabetes Program Recommendations:    If post-prandials > 180 mg/dL, consider adding small amount of Novolog for meal coverage, if appropriate.  Will follow.  Thank you. Lorenda Peck, RD, LDN, [place] Inpatient Diabetes Coordinator [phone]

## 2022-03-30 NOTE — Telephone Encounter (Signed)
[facility] nurse went out to see patient. She was advised by patients son  that patient had been in the hospital since Monday 9/18. She just want to let Anda Kraft know.

## 2022-03-30 NOTE — Progress Notes (Signed)
  Progress Note Patient: [name] BVQ:[id number] DOB: 1970-10-12 DOA: 03/29/2022  DOS: the patient was seen and examined on 03/30/2022  Brief hospital course: [name] is a 51 y.o. female with medical history significant of breast cancer status post right mastectomy on hormone therapy, PVD status post bilateral BKA with chronic left infectious ulcer on BKA stump on chronic antibiotics, chronic HFpEF, CAD, IIDM, bipolar disorder, asthma/COPD, migraines headache, obesity, presented with confusion. Reported symptoms started on Saturday evening progressed to Monday when patient becomes completely unresponsive.  Still not back to baseline. Assessment and Plan: Acute metabolic encephalopathy Suspect multifactorial including polypharmacy and hypercalcemia. Currently receiving IV fluids Mentation improving from what the patient's husband is describing but not back to baseline. Minimizing psychotropic medications. CT head x2 negative for any acute abnormality. MRI brain contraindicated due to metal foreign body. EEG unremarkable. Other metabolic work-up is unremarkable.  No evidence of infection.   Type 2 diabetes mellitus, on long-term insulin use, with neuropathy. Currently holding Lantus and using only sliding scale insulin.   Polypharmacy -She takes 4 different kind of SSRI SRNI,  As mentation still not back to baseline I will be holding most of her medications and monitor closely.   Asthma/COPD -Stable, no acute symptoms or signs of acute exacerbation.  VBG normal pH and PCO2   Breast cancer on hormone therapy -Oncology consulted in the ED   Microalbuminuria -Microalbumin/creatinine> 300, in nephritis range, will need close follow-up with nephrology.  Medically she appears to be euvolemic and creatinine level normal at this point.  Obesity Body mass index is 37.84 kg/m.  Placing the pt at higher risk of poor outcomes.  Bilateral buttocks stage II pressure ulcer present on  admission. Continue dressing changes.  Subjective: Denies any acute complaint.  No nausea no vomiting no fever no chills.  Physical Exam: Vitals:   03/30/22 0436 03/30/22 0754 03/30/22 1140 03/30/22 1639  BP: (!) 163/66 (!) 175/69 (!) 154/69 (!) 169/73  Pulse: 69 70 67 64  Resp: '17 14 18 18  '$ Temp: 98.3 F (36.8 C) 97.6 F (36.4 C) 98 F (36.7 C) 98.6 F (37 C)  TempSrc:  Oral  Oral  SpO2: 96% 91% 96% 98%  Weight:       General: Appear in mild distress; no visible Abnormal Neck Mass Or lumps, Conjunctiva normal Cardiovascular: S1 and S2 Present, no Murmur, Respiratory: good respiratory effort, Bilateral Air entry present and CTA, no Crackles, no wheezes Abdomen: Bowel Sound present, Non tender  Extremities: bilateral BKA. Neurology: alert and oriented to self  Gait not checked due to patient safety concerns   Data Reviewed: I have Reviewed nursing notes, Vitals, and Lab results since pt's last encounter. Pertinent lab results CBC and BMP I have ordered test including CBC and BMP, ammonia, TSH, free T4, B12, B1 I have ordered imaging studies EEG.   Family Communication: Husband at bedside  Disposition: Status is: Observation  Author: Berle Mull, MD 03/30/2022 5:46 PM  Please look on [email or web address] to find out who is on call.

## 2022-03-30 NOTE — Consult Note (Signed)
[facility] Nurse Consult Note: Reason for Consult: pressure injuries and stump wound Wound type: Healing surgical wound; left BKA; 100% hardened crust, no open wound Stage 2 Pressure Injury: left buttock; clean, moist;1cm 1cm x0.2cm  Stage 2 Pressure Injury: right buttock; clean, moist; 1cm x 2cm x 0.2cm  Pressure Injury POA: Yes Measurement:see above Wound bed: see above  Drainage (amount, consistency, odor) scant Periwound: intact Dressing procedure/placement/frequency: Continue silicone foam to the bilateral buttock and left BKA wounds per nursing skin care order set; change every 3 days    Discussed POC with patient and bedside nurse.  Re consult if needed, will not follow at this time. Thanks  Yahsir Wickens [employer], RN,CWOCN, CNS, [place] [phone])

## 2022-03-31 DIAGNOSIS — E1122 Type 2 diabetes mellitus with diabetic chronic kidney disease: Secondary | ICD-10-CM | POA: Diagnosis present

## 2022-03-31 DIAGNOSIS — I130 Hypertensive heart and chronic kidney disease with heart failure and stage 1 through stage 4 chronic kidney disease, or unspecified chronic kidney disease: Secondary | ICD-10-CM | POA: Diagnosis present

## 2022-03-31 DIAGNOSIS — G9340 Encephalopathy, unspecified: Secondary | ICD-10-CM | POA: Diagnosis not present

## 2022-03-31 DIAGNOSIS — E1140 Type 2 diabetes mellitus with diabetic neuropathy, unspecified: Secondary | ICD-10-CM | POA: Diagnosis present

## 2022-03-31 DIAGNOSIS — T428X5A Adverse effect of antiparkinsonism drugs and other central muscle-tone depressants, initial encounter: Secondary | ICD-10-CM | POA: Diagnosis present

## 2022-03-31 DIAGNOSIS — J449 Chronic obstructive pulmonary disease, unspecified: Secondary | ICD-10-CM | POA: Diagnosis present

## 2022-03-31 DIAGNOSIS — T43215A Adverse effect of selective serotonin and norepinephrine reuptake inhibitors, initial encounter: Secondary | ICD-10-CM | POA: Diagnosis present

## 2022-03-31 DIAGNOSIS — G928 Other toxic encephalopathy: Secondary | ICD-10-CM | POA: Diagnosis present

## 2022-03-31 DIAGNOSIS — E1151 Type 2 diabetes mellitus with diabetic peripheral angiopathy without gangrene: Secondary | ICD-10-CM | POA: Diagnosis present

## 2022-03-31 DIAGNOSIS — Z20822 Contact with and (suspected) exposure to covid-19: Secondary | ICD-10-CM | POA: Diagnosis present

## 2022-03-31 DIAGNOSIS — T43595A Adverse effect of other antipsychotics and neuroleptics, initial encounter: Secondary | ICD-10-CM | POA: Diagnosis present

## 2022-03-31 DIAGNOSIS — E669 Obesity, unspecified: Secondary | ICD-10-CM | POA: Diagnosis present

## 2022-03-31 DIAGNOSIS — L89312 Pressure ulcer of right buttock, stage 2: Secondary | ICD-10-CM | POA: Diagnosis present

## 2022-03-31 DIAGNOSIS — T424X5A Adverse effect of benzodiazepines, initial encounter: Secondary | ICD-10-CM | POA: Diagnosis present

## 2022-03-31 DIAGNOSIS — Z89512 Acquired absence of left leg below knee: Secondary | ICD-10-CM | POA: Diagnosis not present

## 2022-03-31 DIAGNOSIS — F319 Bipolar disorder, unspecified: Secondary | ICD-10-CM | POA: Diagnosis present

## 2022-03-31 DIAGNOSIS — T43225A Adverse effect of selective serotonin reuptake inhibitors, initial encounter: Secondary | ICD-10-CM | POA: Diagnosis present

## 2022-03-31 DIAGNOSIS — C50911 Malignant neoplasm of unspecified site of right female breast: Secondary | ICD-10-CM | POA: Diagnosis present

## 2022-03-31 DIAGNOSIS — R531 Weakness: Secondary | ICD-10-CM | POA: Diagnosis present

## 2022-03-31 DIAGNOSIS — E1165 Type 2 diabetes mellitus with hyperglycemia: Secondary | ICD-10-CM | POA: Diagnosis present

## 2022-03-31 DIAGNOSIS — L89322 Pressure ulcer of left buttock, stage 2: Secondary | ICD-10-CM | POA: Diagnosis present

## 2022-03-31 DIAGNOSIS — I5032 Chronic diastolic (congestive) heart failure: Secondary | ICD-10-CM | POA: Diagnosis present

## 2022-03-31 DIAGNOSIS — T426X5A Adverse effect of other antiepileptic and sedative-hypnotic drugs, initial encounter: Secondary | ICD-10-CM | POA: Diagnosis present

## 2022-03-31 DIAGNOSIS — Z89511 Acquired absence of right leg below knee: Secondary | ICD-10-CM | POA: Diagnosis not present

## 2022-03-31 DIAGNOSIS — Z794 Long term (current) use of insulin: Secondary | ICD-10-CM | POA: Diagnosis not present

## 2022-03-31 LAB — GLUCOSE, CAPILLARY
Glucose-Capillary: 202 mg/dL — ABNORMAL HIGH (ref 70–99)
Glucose-Capillary: 161 mg/dL — ABNORMAL HIGH (ref 70–99)
Glucose-Capillary: 138 mg/dL — ABNORMAL HIGH (ref 70–99)
Glucose-Capillary: 118 mg/dL — ABNORMAL HIGH (ref 70–99)

## 2022-03-31 LAB — CBC
HCT: 37.3 % (ref 36.0–46.0)
Hemoglobin: 12.1 g/dL (ref 12.0–15.0)
MCH: 24.1 pg — ABNORMAL LOW (ref 26.0–34.0)
MCHC: 32.4 g/dL (ref 30.0–36.0)
MCV: 74.3 fL — ABNORMAL LOW (ref 80.0–100.0)
Platelets: 187 10*3/uL (ref 150–400)
RBC: 5.02 MIL/uL (ref 3.87–5.11)
RDW: 18.6 % — ABNORMAL HIGH (ref 11.5–15.5)
WBC: 6.1 10*3/uL (ref 4.0–10.5)
nRBC: 0 % (ref 0.0–0.2)

## 2022-03-31 LAB — BASIC METABOLIC PANEL
Anion gap: 4 — ABNORMAL LOW (ref 5–15)
BUN: 14 mg/dL (ref 6–20)
CO2: 22 mmol/L (ref 22–32)
Calcium: 11.3 mg/dL — ABNORMAL HIGH (ref 8.9–10.3)
Chloride: 112 mmol/L — ABNORMAL HIGH (ref 98–111)
Creatinine, Ser: 0.58 mg/dL (ref 0.44–1.00)
GFR, Estimated: >60 mL/min (ref >60)
Glucose, Bld: 173 mg/dL — ABNORMAL HIGH (ref 70–99)
Potassium: 3.3 mmol/L — ABNORMAL LOW (ref 3.5–5.1)
Sodium: 138 mmol/L (ref 135–145)

## 2022-03-31 LAB — MAGNESIUM: Magnesium: 1.8 mg/dL (ref 1.7–2.4)

## 2022-03-31 LAB — MRSA NEXT GEN BY PCR, NASAL: MRSA by PCR Next Gen: DETECTED — AB

## 2022-03-31 MED ORDER — HYDRALAZINE HCL 50 MG PO TABS
25.0000 mg | ORAL_TABLET | Freq: Three times a day (TID) | ORAL | Status: DC
  Administered 2022-03-31 – 2022-04-02 (×7): 25 mg via ORAL
  Filled 2022-03-31 (×7): qty 1

## 2022-03-31 MED ORDER — POTASSIUM CHLORIDE CRYS ER 20 MEQ PO TBCR
40.0000 meq | EXTENDED_RELEASE_TABLET | Freq: Once | ORAL | Status: AC
  Administered 2022-03-31: 40 meq via ORAL
  Filled 2022-03-31: qty 2

## 2022-03-31 MED ORDER — CHLORHEXIDINE GLUCONATE CLOTH 2 % EX PADS
6.0000 | MEDICATED_PAD | Freq: Every day | CUTANEOUS | Status: DC
  Administered 2022-04-01 – 2022-04-02 (×2): 6 via TOPICAL

## 2022-03-31 MED ORDER — VENLAFAXINE HCL ER 75 MG PO CP24
150.0000 mg | ORAL_CAPSULE | Freq: Every day | ORAL | Status: DC
  Administered 2022-04-01 – 2022-04-02 (×2): 150 mg via ORAL
  Filled 2022-03-31 (×2): qty 2

## 2022-03-31 MED ORDER — TRAZODONE HCL 50 MG PO TABS
100.0000 mg | ORAL_TABLET | Freq: Every day | ORAL | Status: DC
  Administered 2022-03-31 – 2022-04-01 (×2): 100 mg via ORAL
  Filled 2022-03-31 (×2): qty 2

## 2022-03-31 MED ORDER — ATORVASTATIN CALCIUM 20 MG PO TABS
10.0000 mg | ORAL_TABLET | Freq: Every day | ORAL | Status: DC
  Administered 2022-03-31 – 2022-04-02 (×3): 10 mg via ORAL
  Filled 2022-03-31 (×3): qty 1

## 2022-03-31 MED ORDER — SODIUM CHLORIDE 0.9 % IV SOLN: INTRAVENOUS | Status: DC

## 2022-03-31 MED ORDER — HYDRALAZINE HCL 20 MG/ML IJ SOLN
10.0000 mg | INTRAMUSCULAR | Status: DC | PRN
  Administered 2022-04-01 – 2022-04-02 (×3): 10 mg via INTRAVENOUS
  Filled 2022-03-31 (×4): qty 1

## 2022-03-31 MED ORDER — MUPIROCIN 2 % EX OINT
1.0000 | TOPICAL_OINTMENT | Freq: Two times a day (BID) | CUTANEOUS | Status: DC
  Administered 2022-03-31 – 2022-04-02 (×4): 1 via NASAL
  Filled 2022-03-31: qty 22

## 2022-03-31 MED ORDER — LACTULOSE 10 GM/15ML PO SOLN
20.0000 g | Freq: Two times a day (BID) | ORAL | Status: DC
  Administered 2022-03-31 – 2022-04-02 (×4): 20 g via ORAL
  Filled 2022-03-31 (×5): qty 30

## 2022-03-31 NOTE — Progress Notes (Addendum)
Progress Note Patient: [name] EYC:[id number] DOB: 02-May-1971 DOA: 03/29/2022  DOS: the patient was seen and examined on 03/31/2022  Brief hospital course: [name] is a 51 y.o. female with medical history significant of breast cancer status post right mastectomy on hormone therapy, PVD status post bilateral BKA with chronic left infectious ulcer on BKA stump on chronic antibiotics, chronic HFpEF, CAD, IIDM, bipolar disorder, asthma/COPD, migraines headache, obesity, presented with confusion. Reported symptoms started on Saturday evening progressed to Monday when patient becomes completely unresponsive.  Still not back to baseline.  Husband reports visual hallucination. Assessment and Plan: Acute metabolic encephalopathy Suspect multifactorial including polypharmacy and hypercalcemia. Currently receiving IV fluids to treat hypercalcemia. Mentation improving from what the patient's husband is describing but not back to baseline. Minimizing psychotropic medications. CT head x2 negative for any acute abnormality. MRI brain contraindicated due to metal foreign body. EEG unremarkable. Other metabolic work-up is unremarkable.  No evidence of infection. We will gradually reintroduce home psychotropic medications.   Type 2 diabetes mellitus, on long-term insulin use, with neuropathy. Currently holding Lantus and using only sliding scale insulin.   Polypharmacy She takes 4 different kind of SSRI SRNI,  Continue dosing some of the medicine. Most likely will be holding few of the psychotropic medications even at discharge.   Asthma/COPD Stable, no acute symptoms or signs of acute exacerbation.  VBG normal pH and PCO2   Breast cancer on hormone therapy Follows up with oncology.  Outpatient follow-up will be recommended. Discussed with oncology briefly due to his continue anastrozole in the setting of hypercalcemia.   Microalbuminuria Microalbumin/creatinine> 300, in nephritis  range, will need close follow-up with nephrology.  Medically she appears to be euvolemic and creatinine level normal at this point.   Obesity Body mass index is 37.84 kg/m.  Placing the pt at higher risk of poor outcomes.   Bilateral buttocks stage II pressure ulcer present on admission. Continue dressing changes.  Essential hypertension. Blood pressure elevated. We will add hydralazine.  Subjective: Husband reports visual hallucination.  Patient denies any acute complaints.  No nausea no vomiting.  Mentation actually better than yesterday per my evaluation as the patient is able to follow commands consistently.  Physical Exam: Vitals:   03/31/22 0042 03/31/22 0551 03/31/22 0803 03/31/22 1606  BP: (!) 172/64 (!) 180/71 (!) 158/71 (!) 157/64  Pulse: 64 64 65 61  Resp: '14 16 18 16  '$ Temp: 98.2 F (36.8 C) 98.3 F (36.8 C) 97.9 F (36.6 C) 97.8 F (36.6 C)  TempSrc: Oral  Oral Oral  SpO2: 98% 98% 99% 100%  Weight:       General: Appear in mild distress; no visible Abnormal Neck Mass Or lumps, Conjunctiva normal Cardiovascular: S1 and S2 Present, no Murmur, Respiratory: good respiratory effort, Bilateral Air entry present and CTA, no Crackles, no wheezes Abdomen: Bowel Sound present, Non tender  Extremities: bilateral BKA Neurology: alert and oriented to time, place, and person no asterixis, no other focal deficit Gait not checked due to patient safety concerns   Data Reviewed: I have Reviewed nursing notes, Vitals, and Lab results since pt's last encounter. Pertinent lab results CBC and BMP I have ordered test including CBC and BMP   Briefly discussed patient's case on oncology.  Family Communication: Husband at bedside  Disposition: Status is: Inpatient Remains inpatient appropriate because: Mentation still not back to baseline.  Calcium level elevated.   Author: Berle Mull, MD 03/31/2022 6:10 PM  Please look on [email or web address] to find  out who is on call.
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
## 2022-03-31 NOTE — Plan of Care (Signed)

## 2022-04-01 DIAGNOSIS — G9340 Encephalopathy, unspecified: Secondary | ICD-10-CM | POA: Diagnosis not present

## 2022-04-01 LAB — CBC
HCT: 35.9 % — ABNORMAL LOW (ref 36.0–46.0)
Hemoglobin: 11.6 g/dL — ABNORMAL LOW (ref 12.0–15.0)
MCH: 24 pg — ABNORMAL LOW (ref 26.0–34.0)
MCHC: 32.3 g/dL (ref 30.0–36.0)
MCV: 74.3 fL — ABNORMAL LOW (ref 80.0–100.0)
Platelets: 178 10*3/uL (ref 150–400)
RBC: 4.83 MIL/uL (ref 3.87–5.11)
RDW: 18.4 % — ABNORMAL HIGH (ref 11.5–15.5)
WBC: 5.8 10*3/uL (ref 4.0–10.5)
nRBC: 0 % (ref 0.0–0.2)

## 2022-04-01 LAB — BASIC METABOLIC PANEL
Anion gap: 7 (ref 5–15)
BUN: 16 mg/dL (ref 6–20)
CO2: 22 mmol/L (ref 22–32)
Calcium: 11.8 mg/dL — ABNORMAL HIGH (ref 8.9–10.3)
Chloride: 111 mmol/L (ref 98–111)
Creatinine, Ser: 0.63 mg/dL (ref 0.44–1.00)
GFR, Estimated: >60 mL/min (ref >60)
Glucose, Bld: 136 mg/dL — ABNORMAL HIGH (ref 70–99)
Potassium: 3.3 mmol/L — ABNORMAL LOW (ref 3.5–5.1)
Sodium: 140 mmol/L (ref 135–145)

## 2022-04-01 LAB — COMPREHENSIVE METABOLIC PANEL
ALT: 13 U/L (ref 0–44)
AST: 18 U/L (ref 15–41)
Albumin: 2.7 g/dL — ABNORMAL LOW (ref 3.5–5.0)
Alkaline Phosphatase: 114 U/L (ref 38–126)
Anion gap: 3 — ABNORMAL LOW (ref 5–15)
BUN: 14 mg/dL (ref 6–20)
CO2: 23 mmol/L (ref 22–32)
Calcium: 11.6 mg/dL — ABNORMAL HIGH (ref 8.9–10.3)
Chloride: 114 mmol/L — ABNORMAL HIGH (ref 98–111)
Creatinine, Ser: 0.6 mg/dL (ref 0.44–1.00)
GFR, Estimated: >60 mL/min (ref >60)
Glucose, Bld: 107 mg/dL — ABNORMAL HIGH (ref 70–99)
Potassium: 3.6 mmol/L (ref 3.5–5.1)
Sodium: 140 mmol/L (ref 135–145)
Total Bilirubin: 0.7 mg/dL (ref 0.3–1.2)
Total Protein: 7.8 g/dL (ref 6.5–8.1)

## 2022-04-01 LAB — GLUCOSE, CAPILLARY
Glucose-Capillary: 154 mg/dL — ABNORMAL HIGH (ref 70–99)
Glucose-Capillary: 125 mg/dL — ABNORMAL HIGH (ref 70–99)
Glucose-Capillary: 131 mg/dL — ABNORMAL HIGH (ref 70–99)
Glucose-Capillary: 122 mg/dL — ABNORMAL HIGH (ref 70–99)

## 2022-04-01 LAB — AMMONIA: Ammonia: 32 umol/L (ref 9–35)

## 2022-04-01 LAB — PARATHYROID HORMONE, INTACT (NO CA): PTH: 7 pg/mL — ABNORMAL LOW (ref 15–65)

## 2022-04-01 LAB — MAGNESIUM: Magnesium: 1.8 mg/dL (ref 1.7–2.4)

## 2022-04-01 MED ORDER — ZOLEDRONIC ACID 4 MG/5ML IV CONC
4.0000 mg | Freq: Once | INTRAVENOUS | Status: AC
  Administered 2022-04-01: 4 mg via INTRAVENOUS
  Filled 2022-04-01: qty 5

## 2022-04-01 NOTE — Progress Notes (Signed)
Progress Note Patient: [name] MWN:[id number] DOB: 01-22-1971 DOA: 03/29/2022  DOS: the patient was seen and examined on 04/01/2022  Brief hospital course: [name] is a 51 y.o. female with medical history significant of breast cancer status post right mastectomy on hormone therapy, PVD status post bilateral BKA with chronic left infectious ulcer on BKA stump on chronic antibiotics, chronic HFpEF, CAD, IIDM, bipolar disorder, asthma/COPD, migraines headache, obesity, presented with confusion. Reported symptoms started on Saturday evening progressed to Monday when patient becomes completely unresponsive.  Still not back to baseline.  Husband reports visual hallucination.  Mentation improved significantly on 9/22 but calcium level remains elevated and trending up therefore given Zometa. Assessment and Plan: Acute metabolic encephalopathy Suspect multifactorial including polypharmacy and hypercalcemia. Currently receiving IV fluids to treat hypercalcemia. Mentation improving from what the patient's husband is describing but not back to baseline. Minimizing psychotropic medications. CT head x2 negative for any acute abnormality. MRI brain contraindicated due to metal foreign body. EEG unremarkable. Other metabolic work-up is unremarkable.  No evidence of infection. We will gradually reintroduce home psychotropic medications.  Hypercalcemia. Calcium level elevated since admission. We will check vitamin D level. PTH level 7.  PTH RP pending. Treated with IV fluids since admission despite which calcium level remains elevated at. We will give 1 dose of Zometa. If no improvement will initiate calcitonin therapy.   Type 2 diabetes mellitus, on long-term insulin use, with neuropathy. Hemoglobin A1c was 10.3 in 8/25 2023. Continue current sliding scale Lantus combination.   Polypharmacy Patient's home regimen include 1 mg Xanax 4 times as needed, 30 mg BuSpar twice daily, 20 mg Celexa  daily, 100 mg Pristiq daily, 300 mg daily, twice daily, 1 mg Requip nightly, 50 mg Topamax twice daily, 200 mg trazodone nightly.  On top of that she is also on pain medication oxycodone IR. Suspect her presentation is most likely secondary to polypharmacy rather than any acute metabolic issues. Currently the patient is on venlafaxine 150 mg daily Trazodone 100 mg nightly Requip 1 mg nightly Lyrica 100 mg twice daily Xanax 0.5 mg 3 times daily as needed. We are holding BuSpar, Celexa and will discontinue this medication at the time of the discharge.   Asthma/COPD Stable, no acute symptoms or signs of acute exacerbation.  VBG normal pH and PCO2   Breast cancer on hormone therapy Follows up with oncology.  Outpatient follow-up will be recommended. Discussed with oncology briefly due to his continue anastrozole in the setting of hypercalcemia.   Microalbuminuria Microalbumin/creatinine> 300, in nephritis range, will need close follow-up with nephrology.  Medically she appears to be euvolemic and creatinine level normal at this point.   Obesity Body mass index is 37.84 kg/m.  Placing the pt at higher risk of poor outcomes.   Bilateral buttocks stage II pressure ulcer present on admission. Continue dressing changes.   Essential hypertension. Blood pressure elevated. Continue hydralazine.  Subjective: Mentation improving.  No nausea no vomiting.  Want to go home even if it is AMA.  After discussion with the husband currently at amenable to stay.  Physical Exam: Vitals:   04/01/22 0048 04/01/22 0458 04/01/22 0733 04/01/22 1151  BP: (!) 157/74 (!) 164/73 (!) 136/56 (!) 165/75  Pulse: 63 65 65 64  Resp: '17 17 17 16  '$ Temp: 98.2 F (36.8 C) 98.2 F (36.8 C) 97.8 F (36.6 C) 98.7 F (37.1 C)  TempSrc:      SpO2: 98% 100% 99% 100%  Weight:  General: Appear in mild distress; no visible Abnormal Neck Mass Or lumps, Conjunctiva normal Cardiovascular: S1 and S2 Present, no  Murmur, Respiratory: good respiratory effort, Bilateral Air entry present and CTA, no Crackles, no wheezes Abdomen: Bowel Sound present, Non tender  Extremities: bilateral BKA Neurology: alert and oriented to time, place, and person  Gait not checked due to patient safety concerns   Data Reviewed: I have Reviewed nursing notes, Vitals, and Lab results since pt's last encounter. Pertinent lab results CBC and BMP I have ordered test including CBC and BMP    Family Communication: Husband at bedside  Disposition: Status is: Inpatient Remains inpatient appropriate because: Need for calcium correction  Author: Berle Mull, MD 04/01/2022 5:02 PM  Please look on [email or web address] to find out who is on call.
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
## 2022-04-01 NOTE — Plan of Care (Signed)

## 2022-04-02 ENCOUNTER — Encounter: Payer: Self-pay | Admitting: Internal Medicine

## 2022-04-02 DIAGNOSIS — G9340 Encephalopathy, unspecified: Secondary | ICD-10-CM | POA: Diagnosis not present

## 2022-04-02 LAB — BASIC METABOLIC PANEL
Anion gap: 7 (ref 5–15)
Anion gap: 6 (ref 5–15)
BUN: 12 mg/dL (ref 6–20)
BUN: 11 mg/dL (ref 6–20)
CO2: 20 mmol/L — ABNORMAL LOW (ref 22–32)
CO2: 20 mmol/L — ABNORMAL LOW (ref 22–32)
Calcium: 11.3 mg/dL — ABNORMAL HIGH (ref 8.9–10.3)
Calcium: 10.9 mg/dL — ABNORMAL HIGH (ref 8.9–10.3)
Chloride: 113 mmol/L — ABNORMAL HIGH (ref 98–111)
Chloride: 114 mmol/L — ABNORMAL HIGH (ref 98–111)
Creatinine, Ser: 0.52 mg/dL (ref 0.44–1.00)
Creatinine, Ser: 0.49 mg/dL (ref 0.44–1.00)
GFR, Estimated: >60 mL/min (ref >60)
GFR, Estimated: >60 mL/min (ref >60)
Glucose, Bld: 150 mg/dL — ABNORMAL HIGH (ref 70–99)
Glucose, Bld: 130 mg/dL — ABNORMAL HIGH (ref 70–99)
Potassium: 3.1 mmol/L — ABNORMAL LOW (ref 3.5–5.1)
Potassium: 3.5 mmol/L (ref 3.5–5.1)
Sodium: 140 mmol/L (ref 135–145)
Sodium: 140 mmol/L (ref 135–145)

## 2022-04-02 LAB — CBC
HCT: 39.1 % (ref 36.0–46.0)
Hemoglobin: 12.9 g/dL (ref 12.0–15.0)
MCH: 24.4 pg — ABNORMAL LOW (ref 26.0–34.0)
MCHC: 33 g/dL (ref 30.0–36.0)
MCV: 73.9 fL — ABNORMAL LOW (ref 80.0–100.0)
Platelets: 182 10*3/uL (ref 150–400)
RBC: 5.29 MIL/uL — ABNORMAL HIGH (ref 3.87–5.11)
RDW: 18.3 % — ABNORMAL HIGH (ref 11.5–15.5)
WBC: 8.3 10*3/uL (ref 4.0–10.5)
nRBC: 0 % (ref 0.0–0.2)

## 2022-04-02 LAB — GLUCOSE, CAPILLARY
Glucose-Capillary: 140 mg/dL — ABNORMAL HIGH (ref 70–99)
Glucose-Capillary: 149 mg/dL — ABNORMAL HIGH (ref 70–99)
Glucose-Capillary: 126 mg/dL — ABNORMAL HIGH (ref 70–99)

## 2022-04-02 LAB — MAGNESIUM: Magnesium: 1.7 mg/dL (ref 1.7–2.4)

## 2022-04-02 MED ORDER — INSULIN GLARGINE 100 UNIT/ML SOLOSTAR PEN: 20.0000 [IU] | PEN_INJECTOR | Freq: Every day | SUBCUTANEOUS | 11 refills | Status: DC

## 2022-04-02 MED ORDER — INSULIN ASPART 100 UNIT/ML FLEXPEN: PEN_INJECTOR | SUBCUTANEOUS | 11 refills | Status: DC

## 2022-04-02 MED ORDER — LACTULOSE 10 GM/15ML PO SOLN: 20.0000 g | Freq: Every day | ORAL | 0 refills | Status: AC

## 2022-04-02 MED ORDER — TRAZODONE HCL 50 MG PO TABS: 50.0000 mg | ORAL_TABLET | Freq: Every day | ORAL | 0 refills | Status: DC

## 2022-04-02 MED ORDER — PREGABALIN 100 MG PO CAPS: 100.0000 mg | ORAL_CAPSULE | Freq: Two times a day (BID) | ORAL | 0 refills | Status: DC

## 2022-04-02 MED ORDER — POTASSIUM CHLORIDE CRYS ER 20 MEQ PO TBCR
40.0000 meq | EXTENDED_RELEASE_TABLET | ORAL | Status: AC
  Administered 2022-04-02 (×2): 40 meq via ORAL
  Filled 2022-04-02: qty 2

## 2022-04-02 MED ORDER — CALCITONIN (SALMON) 200 UNIT/ML IJ SOLN
4.0000 [IU]/kg | Freq: Two times a day (BID) | INTRAMUSCULAR | Status: AC
  Administered 2022-04-02: 400 [IU] via SUBCUTANEOUS
  Filled 2022-04-02: qty 2

## 2022-04-02 MED ORDER — TOPIRAMATE 50 MG PO TABS: 50.0000 mg | ORAL_TABLET | Freq: Every day | ORAL | 0 refills | Status: DC

## 2022-04-03 LAB — CULTURE, BLOOD (ROUTINE X 2)
Culture: NO GROWTH
Culture: NO GROWTH
Special Requests: ADEQUATE
Special Requests: ADEQUATE

## 2022-04-04 ENCOUNTER — Telehealth: Payer: Self-pay | Admitting: Internal Medicine

## 2022-04-04 ENCOUNTER — Telehealth: Payer: Self-pay | Admitting: Primary Care

## 2022-04-04 LAB — PTH-RELATED PEPTIDE: PTH-related peptide: <2 pmol/L

## 2022-04-04 LAB — VITAMIN B1: Vitamin B1 (Thiamine): 113.2 nmol/L (ref 66.5–200.0)

## 2022-04-04 NOTE — Telephone Encounter (Signed)
Home Health verbal orders Caller Name: [place] Name: [facility]   Callback number: [phone], secured, no ext  Requesting OT/PT/Skilled nursing/Social Work/Speech: Nursing   Reason: Needed orders to resume after being discharge from hospital 04/02/22  Frequency:   Please forward to [facility] pool or providers CMA

## 2022-04-04 NOTE — Telephone Encounter (Signed)
Pt left Vm and stated she recently got out of the hospital and would like to schedule an appt to come in to see Dr. [name].

## 2022-04-05 DIAGNOSIS — Z79899 Other long term (current) drug therapy: Secondary | ICD-10-CM

## 2022-04-05 HISTORY — DX: Other long term (current) drug therapy: Z79.899

## 2022-04-05 NOTE — Telephone Encounter (Signed)
Approved.  

## 2022-04-05 NOTE — Discharge Summary (Signed)
Physician Discharge Summary   Patient: [name] MRN: [id number] DOB: November 05, 1970  Admit date:     03/29/2022  Discharge date: 04/02/2022  Discharge Physician: Berle Mull  PCP: Pleas Koch, NP  Recommendations at discharge: Follow-up with PCP in 1 week.   Follow-up Information     Pleas Koch, NP. Schedule an appointment as soon as possible for a visit in 1 week(s).   Specialty: Internal Medicine Contact information: [place] [address] [phone]                Discharge Diagnoses: Principal Problem:   Encephalopathy Active Problems:   Acute encephalopathy   Pressure injury of skin  Hospital Course: [name] is a 51 y.o. female with medical history significant of breast cancer status post right mastectomy on hormone therapy, PVD status post bilateral BKA with chronic left infectious ulcer on BKA stump on chronic antibiotics, chronic HFpEF, CAD, IIDM, bipolar disorder, asthma/COPD, migraines headache, obesity, presented with confusion. Reported symptoms started on Saturday evening progressed to Monday when patient becomes completely unresponsive.  Still not back to baseline.  Husband reports visual hallucination.  Mentation improved significantly on 9/22 but calcium level remains elevated and trending up therefore given Zometa. Assessment and Plan  Acute metabolic encephalopathy Suspect multifactorial including polypharmacy and hypercalcemia. Currently receiving IV fluids to treat hypercalcemia. Mentation improving from what the patient's husband is describing but not back to baseline. Minimizing psychotropic medications. CT head x2 negative for any acute abnormality. MRI brain contraindicated due to metal foreign body. EEG unremarkable. Other metabolic work-up is unremarkable.  No evidence of infection. We will gradually reintroduce home psychotropic medications.   Hypercalcemia. Calcium level elevated since  admission. We will check vitamin D level. PTH level 7.  PTH RP pending. Treated with IV fluids since admission despite which calcium level remains elevated at. Patient was also given 1 dose of Zometa on 1 dose of calcitonin. Recommend to stop anastrozole on discharge.   Type 2 diabetes mellitus, on long-term insulin use, with neuropathy. Hemoglobin A1c was 10.3 in 8/25 2023. Continue current sliding scale Lantus combination.   Polypharmacy Patient's home regimen include 1 mg Xanax 4 times as needed, 30 mg BuSpar twice daily, 20 mg Celexa daily, 100 mg Pristiq daily, 300 mg daily, twice daily, 1 mg Requip nightly, 50 mg Topamax twice daily, 200 mg trazodone nightly.  On top of that she is also on pain medication oxycodone IR. Suspect her presentation is most likely secondary to polypharmacy rather than any acute metabolic issues. Currently the patient is on venlafaxine 150 mg daily Trazodone 100 mg nightly Requip 1 mg nightly Lyrica 100 mg twice daily Xanax 0.5 mg 3 times daily as needed. We are holding BuSpar, Celexa and will discontinue this medication at the time of the discharge.   Asthma/COPD Stable, no acute symptoms or signs of acute exacerbation.  VBG normal pH and PCO2   Breast cancer on hormone therapy Follows up with oncology.  Outpatient follow-up will be recommended. Discussed with oncology briefly due to his continue anastrozole in the setting of hypercalcemia.   Microalbuminuria Microalbumin/creatinine> 300, in nephritis range, will need close follow-up with nephrology.  Medically she appears to be euvolemic and creatinine level normal at this point.   Obesity ruled out BMI calculation can be erroneous due to other bilateral BKA status.   Bilateral buttocks stage II pressure ulcer present on admission. Continue dressing changes.   Essential hypertension. Blood pressure elevated. Continue  hydralazine.   Pain control - [employer] Controlled Substance  Reporting System database was reviewed. and patient was instructed, not to drive, operate heavy machinery, perform activities at heights, swimming or participation in water activities or provide baby-sitting services while on Pain, Sleep and Anxiety Medications; until their outpatient Physician has advised to do so again. Also recommended to not to take more than prescribed Pain, Sleep and Anxiety Medications.  Consultants:  none  Procedures performed:  EEG  DISCHARGE MEDICATION: Allergies as of 04/02/2022       Reactions   Adhesive [tape] Rash, Other (See Comments)   TAKES OFF THE SKIN (CERTAIN MEDICAL TAPES DO THIS!!)   Metoprolol Shortness Of Breath   Occurrence of shortness of breath after 3 days   Montelukast Shortness Of Breath   Morphine Sulfate Anaphylaxis, Shortness Of Breath, Nausea And Vomiting   Swollen Throat - Able to tolerate dilaudid   Penicillins Anaphylaxis, Hives, Shortness Of Breath   Throat swells Has patient had a PCN reaction causing immediate rash, facial/tongue/throat swelling, SOB or lightheadedness with hypotension: Yes Has patient had a PCN reaction causing severe rash involving mucus membranes or skin necrosis: No Has patient had a PCN reaction that required hospitalization: Yes Has patient had a PCN reaction occurring within the last 10 years: No If all of the above answers are "NO", then may proceed with Cephalosporin use.   Silicone Rash   Takes off the skin (certain medical tapes do this)   Diltiazem Swelling   Gabapentin Swelling   Midodrine    Lightheaded and falling down   Percocet [oxycodone-acetaminophen] Rash        Medication List     STOP taking these medications    anastrozole 1 MG tablet Commonly known as: ARIMIDEX   busPIRone 30 MG tablet Commonly known as: BUSPAR   citalopram 20 MG tablet Commonly known as: CELEXA   Eylea 2 MG/0.05ML Sosy Generic drug: Aflibercept       TAKE these medications    acetaminophen 325 MG  tablet Commonly known as: TYLENOL Take 2 tablets (650 mg total) by mouth every 4 (four) hours as needed for headache or mild pain.   albuterol 108 (90 Base) MCG/ACT inhaler Commonly known as: VENTOLIN HFA Inhale 2 puffs into the lungs every 6 (six) hours as needed for wheezing or shortness of breath.   ALPRAZolam 1 MG tablet Commonly known as: XANAX Take 1 tablet (1 mg total) by mouth 4 (four) times daily as needed for anxiety.   aspirin EC 81 MG tablet Take 81 mg by mouth daily before breakfast.   atorvastatin 10 MG tablet Commonly known as: LIPITOR Take 1 tablet (10 mg total) by mouth daily. For cholesterol   budesonide-formoterol 80-4.5 MCG/ACT inhaler Commonly known as: Symbicort INHALE 2 PUFFS BY MOUTH EVERY 12 HOURS TO PREVENT COUGH OR WHEEZING *RINSE MOUTH AFTER EACH USE*   desvenlafaxine 100 MG 24 hr tablet Commonly known as: PRISTIQ Take 100 mg by mouth daily.   insulin aspart 100 UNIT/ML FlexPen Commonly known as: NOVOLOG Inject 10 units into the skin before breakfast and inject 10 units into the skin before dinner for diabetes. What changed: additional instructions   insulin glargine 100 UNIT/ML Solostar Pen Commonly known as: LANTUS Inject 20 Units into the skin daily. What changed: how much to take   lactulose 10 GM/15ML solution Commonly known as: CHRONULAC Take 30 mLs (20 g total) by mouth daily for 3 days.   naloxone 4 MG/0.1ML Liqd nasal spray  kit Commonly known as: NARCAN SMARTSIG:Spray(s) In Nostril   ondansetron 8 MG tablet Commonly known as: ZOFRAN One pill every 8 hours as needed for nausea/vomitting.   oxyCODONE 5 MG immediate release tablet Commonly known as: Oxy IR/ROXICODONE Take 1 tablet (5 mg total) by mouth every 6 (six) hours as needed for severe pain.   OXYGEN Inhale 2-4 L into the lungs 2 (two) times daily as needed (shortness of breath).   pregabalin 100 MG capsule Commonly known as: LYRICA Take 1 capsule (100 mg total) by  mouth 2 (two) times daily. What changed:  medication strength See the new instructions.   rOPINIRole 1 MG tablet Commonly known as: REQUIP TAKE 1 TABLET BY MOUTH ONCE DAILY AT BEDTIME FOR RESTLESS LEG   spironolactone 50 MG tablet Commonly known as: ALDACTONE Take 50 mg by mouth daily.   topiramate 50 MG tablet Commonly known as: TOPAMAX Take 1 tablet (50 mg total) by mouth daily. What changed: See the new instructions.   traZODone 50 MG tablet Commonly known as: DESYREL Take 1 tablet (50 mg total) by mouth at bedtime. What changed:  medication strength how much to take               Discharge Care Instructions  (From admission, onward)           Start     Ordered   04/02/22 0000  Discharge wound care:       Comments: Silicone foam dressings to the left stump and buttock wounds  change every 3 days. Assess under dressings each shift for any acute changes in the wounds.   04/02/22 1613           Disposition: Home Diet recommendation: Regular diet  Discharge Exam: Vitals:   04/02/22 0042 04/02/22 0602 04/02/22 0727 04/02/22 1142  BP: (!) 181/73 (!) 170/73 (!) 172/72 138/62  Pulse: 80 91 94 79  Resp: 18 18 16 16   Temp: 97.9 F (36.6 C) 97.7 F (36.5 C) 98.1 F (36.7 C) 98.5 F (36.9 C)  TempSrc: Oral Oral    SpO2: 98% 98% 99% 100%  Weight:       General: Appear in mild distress; no visible Abnormal Neck Mass Or lumps, Conjunctiva normal Cardiovascular: S1 and S2 Present, no Murmur, Respiratory: good respiratory effort, Bilateral Air entry present and CTA, no Crackles, no wheezes Abdomen: Bowel Sound present, Non tender  Extremities: Bilateral BKA Neurology: alert and oriented to time, place, and person  [facility] Weights   03/29/22 0918  Weight: 100 kg   Condition at discharge: stable  The results of significant diagnostics from this hospitalization (including imaging, microbiology, ancillary and laboratory) are listed below for reference.    Imaging Studies: EEG adult  Result Date: 04-15-22 Lora Havens, MD     04/15/22  5:33 PM Patient Name: Uzma Hellmer MRN: [id number] Epilepsy Attending: Lora Havens Referring Physician/Provider: [name], MD Date: 2022/04/15 Duration: 20.49 mins Patient history: 51yo F with ams. EEG to evaluate for seizure Level of alertness: Awake= AEDs during EEG study: TPM, PGB Technical aspects: This EEG study was done with scalp electrodes positioned according to the 10-20 International system of electrode placement. Electrical activity was reviewed with band pass filter of 1-70Hz , sensitivity of 7 uV/mm, display speed of 76m/sec with a 60Hz  notched filter applied as appropriate. EEG data were recorded continuously and digitally stored.  Video monitoring was available and reviewed as appropriate. Description: No posterior dominant rhythm was seen. EEG showed  continuous generalized 3 to 6 Hz theta-delta slowing, at times with triphasic morphology. Physiologic photic driving was not seen during photic stimulation.  Hyperventilation was not performed.   ABNORMALITY - Continuous slow, generalized IMPRESSION: This study is suggestive of moderate diffuse encephalopathy, nonspecific etiology. No seizures or epileptiform discharges were seen throughout the recording. [place]   CT HEAD WO CONTRAST (5MM)  Result Date: 03/30/2022 CLINICAL DATA:  History of breast cancer and lower extremity amputation. Follow-up stroke. History of weakness. EXAM: CT HEAD WITHOUT CONTRAST TECHNIQUE: Contiguous axial images were obtained from the base of the skull through the vertex without intravenous contrast. RADIATION DOSE REDUCTION: This exam was performed according to the departmental dose-optimization program which includes automated exposure control, adjustment of the mA and/or kV according to patient size and/or use of iterative reconstruction technique. COMPARISON:  03/29/2022 FINDINGS: Brain: The brain shows  a normal appearance without evidence of malformation, atrophy, old or acute small or large vessel infarction, mass lesion, hemorrhage, hydrocephalus or extra-axial collection. Vascular: There is atherosclerotic calcification of the major vessels at the base of the brain. Skull: Normal.  No traumatic finding.  No focal bone lesion. Sinuses/Orbits: Sinuses are clear. Orbits appear normal. Mastoids are clear. Other: BB within the soft tissues of the scalp at the left posterior frontal convexity. IMPRESSION: Normal intracranial exam.  No change. Electronically Signed   By: Nelson Chimes M.D.   On: 03/30/2022 08:30   DG Chest 1 View  Result Date: 03/29/2022 CLINICAL DATA:  Altered mental state. EXAM: CHEST  1 VIEW COMPARISON:  November 04, 2021. FINDINGS: Stable cardiomegaly. Lungs are clear. Eventration of right hemidiaphragm is noted. Bony thorax is unremarkable. IMPRESSION: No active disease. Electronically Signed   By: Marijo Conception M.D.   On: 03/29/2022 16:30   CT ABDOMEN PELVIS W CONTRAST  Result Date: 03/29/2022 CLINICAL DATA:  Abdominal pain. EXAM: CT ABDOMEN AND PELVIS WITH CONTRAST TECHNIQUE: Multidetector CT imaging of the abdomen and pelvis was performed using the standard protocol following bolus administration of intravenous contrast. RADIATION DOSE REDUCTION: This exam was performed according to the departmental dose-optimization program which includes automated exposure control, adjustment of the mA and/or kV according to patient size and/or use of iterative reconstruction technique. CONTRAST:  132m OMNIPAQUE IOHEXOL 300 MG/ML  SOLN COMPARISON:  CT examination dated March 08, 2021 FINDINGS: Lower chest: Small right pleural effusion. Ground-glass opacities and fibrotic changes of the bilateral lung bases, right greater than the left. 6 mm nodule in the right lower lobe not significantly changed from prior examination. Hepatobiliary: Hepatic steatosis. No appreciable hepatic mass or focal hepatic  lesion. No gallstones, gallbladder wall thickening or biliary dilatation. Pancreas: Unremarkable. No pancreatic ductal dilatation or surrounding inflammatory changes. Spleen: Normal in size without focal abnormality. Adrenals/Urinary Tract: Adrenal glands are unremarkable. Kidneys are normal, without renal calculi, focal lesion, or hydronephrosis. Bladder is unremarkable. Stomach/Bowel: Stomach is within normal limits. Appendix appears normal. No evidence of bowel wall thickening, distention, or inflammatory changes. Colonic diverticulosis prominent in the sigmoid colon without evidence of acute diverticulitis. Vascular/Lymphatic: Aortic atherosclerosis. No enlarged abdominal or pelvic lymph nodes. Reproductive: Uterus and bilateral adnexa are unremarkable. Other: No abdominal wall hernia or abnormality. No abdominopelvic ascites. Musculoskeletal: Diffuse idiopathic skeletal hyperostosis and mild thoracic kyphosis. No acute osseous abnormality. Mild subcutaneous soft tissue edema and fat stranding prominent in the right hip. IMPRESSION: 1. Fibrotic changes, small right pleural effusion and a stable 6 mm nodule in the right lower lobe. Non-contrast chest CT  at 6-12 months is recommended. If the nodule is stable at time of repeat CT, then future CT at 18-24 months (from today's scan) is considered optional for low-risk patients, but is recommended for high-risk patients. This recommendation follows the consensus statement: Guidelines for Management of Incidental Pulmonary Nodules Detected on CT Images: From the Fleischner Society 2017; Radiology 2017; 284:228-243. 2. Sigmoid colonic diverticulosis without evidence of acute diverticulitis. 3. Hepatic steatosis. 4. Aortic and branch vessel atherosclerotic calcifications. 5. Mild subcutaneous soft tissue edema and fat stranding in the subcutaneous soft tissues prominent in the right hip/gluteal region. Electronically Signed   By: Keane Police D.O.   On: 03/29/2022 13:59    CT Head Wo Contrast  Result Date: 03/29/2022 CLINICAL DATA:  Weakness. EXAM: CT HEAD WITHOUT CONTRAST TECHNIQUE: Contiguous axial images were obtained from the base of the skull through the vertex without intravenous contrast. RADIATION DOSE REDUCTION: This exam was performed according to the departmental dose-optimization program which includes automated exposure control, adjustment of the mA and/or kV according to patient size and/or use of iterative reconstruction technique. COMPARISON:  CT examination dated March 03, 2021 FINDINGS: Brain: No evidence of acute infarction, hemorrhage, hydrocephalus, extra-axial collection or mass lesion/mass effect. Vascular: No hyperdense vessel or unexpected calcification. Skull: Rounded metallic density/bb marker in the left frontal scalp, unchanged. No acute fracture. Sinuses/Orbits: No acute finding. Other: None. IMPRESSION: No CT evidence of acute intracranial abnormality or significant interval change. Electronically Signed   By: Keane Police D.O.   On: 03/29/2022 13:31    Microbiology: Results for orders placed or performed during the hospital encounter of 03/29/22  SARS Coronavirus 2 by RT PCR (hospital order, performed in [facility] hospital lab) *cepheid single result test* Anterior Nasal Swab     Status: None   Collection Time: 03/29/22  9:52 AM   Specimen: Anterior Nasal Swab  Result Value Ref Range Status   SARS Coronavirus 2 by RT PCR NEGATIVE NEGATIVE Final    Comment: (NOTE) SARS-CoV-2 target nucleic acids are NOT DETECTED.  The SARS-CoV-2 RNA is generally detectable in upper and lower respiratory specimens during the acute phase of infection. The lowest concentration of SARS-CoV-2 viral copies this assay can detect is 250 copies / mL. A negative result does not preclude SARS-CoV-2 infection and should not be used as the sole basis for treatment or other patient management decisions.  A negative result may occur with improper specimen  collection / handling, submission of specimen other than nasopharyngeal swab, presence of viral mutation(s) within the areas targeted by this assay, and inadequate number of viral copies (<250 copies / mL). A negative result must be combined with clinical observations, patient history, and epidemiological information.  Fact Sheet for Patients:   https://www..info/  Fact Sheet for Healthcare Providers: https://hall.com/  This test is not yet approved or  cleared by the [place] FDA and has been authorized for detection and/or diagnosis of SARS-CoV-2 by FDA under an Emergency Use Authorization (EUA).  This EUA will remain in effect (meaning this test can be used) for the duration of the COVID-19 declaration under Section 564(b)(1) of the Act, 21 U.S.C. section 360bbb-3(b)(1), unless the authorization is terminated or revoked sooner.  Performed at [facility], [address] [place], [place] [address]   Blood culture (routine x 2)     Status: None   Collection Time: 03/29/22  4:18 PM   Specimen: BLOOD LEFT HAND  Result Value Ref Range Status   Specimen Description BLOOD  LEFT HAND  Final   Special Requests   Final    BOTTLES DRAWN AEROBIC AND ANAEROBIC Blood Culture adequate volume   Culture   Final    NO GROWTH 5 DAYS Performed at [facility], [address] [place], [place] [address]    Report Status 04/03/2022 FINAL  Final  Blood culture (routine x 2)     Status: None   Collection Time: 03/29/22  4:18 PM   Specimen: BLOOD LEFT FOREARM  Result Value Ref Range Status   Specimen Description BLOOD LEFT FOREARM  Final   Special Requests   Final    BOTTLES DRAWN AEROBIC AND ANAEROBIC Blood Culture adequate volume   Culture   Final    NO GROWTH 5 DAYS Performed at [facility], [address] [place], [place] [address]    Report Status 04/03/2022 FINAL  Final  MRSA Next Gen by PCR, Nasal      Status: Abnormal   Collection Time: 03/31/22  4:45 PM  Result Value Ref Range Status   MRSA by PCR Next Gen DETECTED (A) NOT DETECTED Final    Comment: RESULT CALLED TO, READ BACK BY AND VERIFIED WITH: Eugene Garnet @1820  on 03/31/22 skl (NOTE) The GeneXpert MRSA Assay (FDA approved for NASAL specimens only), is one component of a comprehensive MRSA colonization surveillance program. It is not intended to diagnose MRSA infection nor to guide or monitor treatment for MRSA infections. Test performance is not FDA approved in patients less than 19 years old. Performed at [facility], [address] [place], [place] [address]    *Note: Due to a large number of results and/or encounters for the requested time period, some results have not been displayed. A complete set of results can be found in Results Review.   Labs: CBC: Recent Labs  Lab 03/31/22 0449 04/01/22 0511 04/02/22 0633  WBC 6.1 5.8 8.3  HGB 12.1 11.6* 12.9  HCT 37.3 35.9* 39.1  MCV 74.3* 74.3* 73.9*  PLT 187 178 353   Basic Metabolic Panel: Recent Labs  Lab 03/31/22 0449 04/01/22 0511 04/01/22 1722 04/02/22 0633 04/02/22 1516  NA 138 140 140 140 140  K 3.3* 3.3* 3.6 3.1* 3.5  CL 112* 111 114* 113* 114*  CO2 22 22 23  20* 20*  GLUCOSE 173* 136* 107* 150* 130*  BUN 14 16 14 12 11   CREATININE 0.58 0.63 0.60 0.52 0.49  CALCIUM 11.3* 11.8* 11.6* 11.3* 10.9*  MG 1.8 1.8  --  1.7  --    Liver Function Tests: Recent Labs  Lab 04/01/22 1722  AST 18  ALT 13  ALKPHOS 114  BILITOT 0.7  PROT 7.8  ALBUMIN 2.7*   CBG: Recent Labs  Lab 04/01/22 1519 04/01/22 2020 04/02/22 0740 04/02/22 1208 04/02/22 1532  GLUCAP 131* 122* 140* 149* 126*    Discharge time spent: greater than 30 minutes.  Signed: Berle Mull, MD [facility] 04/02/2022

## 2022-04-05 NOTE — Telephone Encounter (Signed)
Verbal orders given for patient. 

## 2022-04-08 ENCOUNTER — Telehealth: Payer: Self-pay | Admitting: Primary Care

## 2022-04-08 DIAGNOSIS — J449 Chronic obstructive pulmonary disease, unspecified: Secondary | ICD-10-CM | POA: Diagnosis not present

## 2022-04-08 DIAGNOSIS — T8744 Infection of amputation stump, left lower extremity: Secondary | ICD-10-CM | POA: Diagnosis not present

## 2022-04-08 DIAGNOSIS — I5032 Chronic diastolic (congestive) heart failure: Secondary | ICD-10-CM | POA: Diagnosis not present

## 2022-04-08 DIAGNOSIS — E782 Mixed hyperlipidemia: Secondary | ICD-10-CM | POA: Diagnosis not present

## 2022-04-08 DIAGNOSIS — F419 Anxiety disorder, unspecified: Secondary | ICD-10-CM | POA: Diagnosis not present

## 2022-04-08 DIAGNOSIS — I130 Hypertensive heart and chronic kidney disease with heart failure and stage 1 through stage 4 chronic kidney disease, or unspecified chronic kidney disease: Secondary | ICD-10-CM | POA: Diagnosis not present

## 2022-04-08 DIAGNOSIS — F319 Bipolar disorder, unspecified: Secondary | ICD-10-CM | POA: Diagnosis not present

## 2022-04-08 DIAGNOSIS — E1122 Type 2 diabetes mellitus with diabetic chronic kidney disease: Secondary | ICD-10-CM | POA: Diagnosis not present

## 2022-04-08 DIAGNOSIS — E1151 Type 2 diabetes mellitus with diabetic peripheral angiopathy without gangrene: Secondary | ICD-10-CM | POA: Diagnosis not present

## 2022-04-08 DIAGNOSIS — N1831 Chronic kidney disease, stage 3a: Secondary | ICD-10-CM | POA: Diagnosis not present

## 2022-04-08 DIAGNOSIS — Z4781 Encounter for orthopedic aftercare following surgical amputation: Secondary | ICD-10-CM | POA: Diagnosis not present

## 2022-04-08 DIAGNOSIS — I872 Venous insufficiency (chronic) (peripheral): Secondary | ICD-10-CM | POA: Diagnosis not present

## 2022-04-08 DIAGNOSIS — E1169 Type 2 diabetes mellitus with other specified complication: Secondary | ICD-10-CM | POA: Diagnosis not present

## 2022-04-08 NOTE — Telephone Encounter (Signed)
Mickel Baas from [facility] called and she stated she had questions about new orders and wounds. Call back number [phone].

## 2022-04-11 NOTE — Telephone Encounter (Signed)
Noted and I approve whatever she needs to have to treat these wounds. Also, is the patient following with the wound center? Mickel Baas could also touch base with the wound center provider for further input.

## 2022-04-11 NOTE — Telephone Encounter (Signed)
Spoke with Mickel Baas from [facility] and she stated patient showed her wounds on her glutes. Mickel Baas states they look like sheering from where she is putting pressure on those spots. There is one wound on her left glute and one on the right glute. She states they aren't pressure sores yet, but they look like they could get there without treatment. She's requesting new orders for these wounds.  She stated the patients original wounds are healing great, and no shares no concerns with those.

## 2022-04-12 NOTE — Telephone Encounter (Signed)
Spoke with Mickel Baas from [facility]. Advised of approval for treatment of wounds.  No, she stated patient doesn't want to go to wound center.

## 2022-04-16 DIAGNOSIS — F419 Anxiety disorder, unspecified: Secondary | ICD-10-CM | POA: Diagnosis not present

## 2022-04-16 DIAGNOSIS — F319 Bipolar disorder, unspecified: Secondary | ICD-10-CM | POA: Diagnosis not present

## 2022-04-16 DIAGNOSIS — I130 Hypertensive heart and chronic kidney disease with heart failure and stage 1 through stage 4 chronic kidney disease, or unspecified chronic kidney disease: Secondary | ICD-10-CM | POA: Diagnosis not present

## 2022-04-16 DIAGNOSIS — E1151 Type 2 diabetes mellitus with diabetic peripheral angiopathy without gangrene: Secondary | ICD-10-CM | POA: Diagnosis not present

## 2022-04-16 DIAGNOSIS — T8744 Infection of amputation stump, left lower extremity: Secondary | ICD-10-CM | POA: Diagnosis not present

## 2022-04-16 DIAGNOSIS — J449 Chronic obstructive pulmonary disease, unspecified: Secondary | ICD-10-CM | POA: Diagnosis not present

## 2022-04-16 DIAGNOSIS — E782 Mixed hyperlipidemia: Secondary | ICD-10-CM | POA: Diagnosis not present

## 2022-04-16 DIAGNOSIS — I5032 Chronic diastolic (congestive) heart failure: Secondary | ICD-10-CM | POA: Diagnosis not present

## 2022-04-16 DIAGNOSIS — I872 Venous insufficiency (chronic) (peripheral): Secondary | ICD-10-CM | POA: Diagnosis not present

## 2022-04-16 DIAGNOSIS — E1122 Type 2 diabetes mellitus with diabetic chronic kidney disease: Secondary | ICD-10-CM | POA: Diagnosis not present

## 2022-04-16 DIAGNOSIS — Z4781 Encounter for orthopedic aftercare following surgical amputation: Secondary | ICD-10-CM | POA: Diagnosis not present

## 2022-04-16 DIAGNOSIS — E1169 Type 2 diabetes mellitus with other specified complication: Secondary | ICD-10-CM | POA: Diagnosis not present

## 2022-04-16 DIAGNOSIS — N1831 Chronic kidney disease, stage 3a: Secondary | ICD-10-CM | POA: Diagnosis not present

## 2022-04-19 ENCOUNTER — Telehealth: Payer: Self-pay | Admitting: Primary Care

## 2022-04-19 DIAGNOSIS — Z89511 Acquired absence of right leg below knee: Secondary | ICD-10-CM

## 2022-04-19 NOTE — Telephone Encounter (Signed)
Home Health verbal orders Caller Name: [place] Name: [facility]   Callback number: [phone], secured, no ext  Requesting OT/PT/Skilled nursing/Social Work/Speech: nursing  Reason:  Frequency: one week x 4   Please forward to [facility] pool or providers CMA   PT QUESTION:  PT requested pcp to order a Producer, television/film/video for shower?

## 2022-04-19 NOTE — Telephone Encounter (Signed)
Kelly from [facility] has been notified.

## 2022-04-19 NOTE — Telephone Encounter (Signed)
Approved.  I placed a DME order for transfer bench for shower.

## 2022-04-21 ENCOUNTER — Encounter (INDEPENDENT_AMBULATORY_CARE_PROVIDER_SITE_OTHER): Payer: Self-pay | Admitting: Vascular Surgery

## 2022-04-21 ENCOUNTER — Ambulatory Visit (INDEPENDENT_AMBULATORY_CARE_PROVIDER_SITE_OTHER): Payer: BC Managed Care – PPO | Admitting: Vascular Surgery

## 2022-04-21 VITALS — BP 137/71 | HR 70 | Resp 18

## 2022-04-21 DIAGNOSIS — F419 Anxiety disorder, unspecified: Secondary | ICD-10-CM | POA: Diagnosis not present

## 2022-04-21 DIAGNOSIS — N1831 Chronic kidney disease, stage 3a: Secondary | ICD-10-CM | POA: Diagnosis not present

## 2022-04-21 DIAGNOSIS — E1140 Type 2 diabetes mellitus with diabetic neuropathy, unspecified: Secondary | ICD-10-CM | POA: Diagnosis not present

## 2022-04-21 DIAGNOSIS — Z89519 Acquired absence of unspecified leg below knee: Secondary | ICD-10-CM

## 2022-04-21 DIAGNOSIS — Z794 Long term (current) use of insulin: Secondary | ICD-10-CM

## 2022-04-21 DIAGNOSIS — G894 Chronic pain syndrome: Secondary | ICD-10-CM | POA: Diagnosis not present

## 2022-04-21 DIAGNOSIS — J4489 Other specified chronic obstructive pulmonary disease: Secondary | ICD-10-CM

## 2022-04-21 DIAGNOSIS — D631 Anemia in chronic kidney disease: Secondary | ICD-10-CM | POA: Diagnosis not present

## 2022-04-21 DIAGNOSIS — I2510 Atherosclerotic heart disease of native coronary artery without angina pectoris: Secondary | ICD-10-CM

## 2022-04-21 DIAGNOSIS — E782 Mixed hyperlipidemia: Secondary | ICD-10-CM | POA: Diagnosis not present

## 2022-04-21 DIAGNOSIS — E1151 Type 2 diabetes mellitus with diabetic peripheral angiopathy without gangrene: Secondary | ICD-10-CM | POA: Diagnosis not present

## 2022-04-21 DIAGNOSIS — E1169 Type 2 diabetes mellitus with other specified complication: Secondary | ICD-10-CM | POA: Diagnosis not present

## 2022-04-21 DIAGNOSIS — I5032 Chronic diastolic (congestive) heart failure: Secondary | ICD-10-CM | POA: Diagnosis not present

## 2022-04-21 DIAGNOSIS — G9341 Metabolic encephalopathy: Secondary | ICD-10-CM | POA: Diagnosis not present

## 2022-04-21 DIAGNOSIS — E1122 Type 2 diabetes mellitus with diabetic chronic kidney disease: Secondary | ICD-10-CM | POA: Diagnosis not present

## 2022-04-21 DIAGNOSIS — F319 Bipolar disorder, unspecified: Secondary | ICD-10-CM | POA: Diagnosis not present

## 2022-04-21 DIAGNOSIS — I872 Venous insufficiency (chronic) (peripheral): Secondary | ICD-10-CM | POA: Diagnosis not present

## 2022-04-21 DIAGNOSIS — I130 Hypertensive heart and chronic kidney disease with heart failure and stage 1 through stage 4 chronic kidney disease, or unspecified chronic kidney disease: Secondary | ICD-10-CM | POA: Diagnosis not present

## 2022-04-23 ENCOUNTER — Encounter (INDEPENDENT_AMBULATORY_CARE_PROVIDER_SITE_OTHER): Payer: Self-pay | Admitting: Vascular Surgery

## 2022-04-23 MED ORDER — OXYCODONE HCL 5 MG PO TABS: 5.0000 mg | ORAL_TABLET | Freq: Four times a day (QID) | ORAL | 0 refills | Status: DC | PRN
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
## 2022-04-25 ENCOUNTER — Telehealth: Payer: Medicare Other | Admitting: Internal Medicine

## 2022-04-25 DIAGNOSIS — S81802A Unspecified open wound, left lower leg, initial encounter: Secondary | ICD-10-CM

## 2022-04-25 DIAGNOSIS — S88112S Complete traumatic amputation at level between knee and ankle, left lower leg, sequela: Secondary | ICD-10-CM

## 2022-04-25 DIAGNOSIS — S88111S Complete traumatic amputation at level between knee and ankle, right lower leg, sequela: Secondary | ICD-10-CM
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
## 2022-04-26 DIAGNOSIS — I872 Venous insufficiency (chronic) (peripheral): Secondary | ICD-10-CM | POA: Diagnosis not present

## 2022-04-26 DIAGNOSIS — E1169 Type 2 diabetes mellitus with other specified complication: Secondary | ICD-10-CM | POA: Diagnosis not present

## 2022-04-26 DIAGNOSIS — E1122 Type 2 diabetes mellitus with diabetic chronic kidney disease: Secondary | ICD-10-CM | POA: Diagnosis not present

## 2022-04-26 DIAGNOSIS — G9341 Metabolic encephalopathy: Secondary | ICD-10-CM | POA: Diagnosis not present

## 2022-04-26 DIAGNOSIS — N1831 Chronic kidney disease, stage 3a: Secondary | ICD-10-CM | POA: Diagnosis not present

## 2022-04-26 DIAGNOSIS — I130 Hypertensive heart and chronic kidney disease with heart failure and stage 1 through stage 4 chronic kidney disease, or unspecified chronic kidney disease: Secondary | ICD-10-CM | POA: Diagnosis not present

## 2022-04-26 DIAGNOSIS — I5032 Chronic diastolic (congestive) heart failure: Secondary | ICD-10-CM | POA: Diagnosis not present

## 2022-04-26 DIAGNOSIS — E1151 Type 2 diabetes mellitus with diabetic peripheral angiopathy without gangrene: Secondary | ICD-10-CM | POA: Diagnosis not present

## 2022-04-26 DIAGNOSIS — D631 Anemia in chronic kidney disease: Secondary | ICD-10-CM | POA: Diagnosis not present

## 2022-04-26 DIAGNOSIS — F419 Anxiety disorder, unspecified: Secondary | ICD-10-CM | POA: Diagnosis not present

## 2022-04-26 DIAGNOSIS — F319 Bipolar disorder, unspecified: Secondary | ICD-10-CM | POA: Diagnosis not present

## 2022-04-26 DIAGNOSIS — E782 Mixed hyperlipidemia: Secondary | ICD-10-CM | POA: Diagnosis not present

## 2022-04-27 ENCOUNTER — Telehealth: Payer: Self-pay | Admitting: Primary Care

## 2022-04-27 ENCOUNTER — Telehealth (INDEPENDENT_AMBULATORY_CARE_PROVIDER_SITE_OTHER): Payer: BC Managed Care – PPO | Admitting: Primary Care

## 2022-04-27 VITALS — Ht 64.0 in | Wt 220.0 lb

## 2022-04-27 DIAGNOSIS — J9611 Chronic respiratory failure with hypoxia: Secondary | ICD-10-CM

## 2022-04-27 DIAGNOSIS — E1165 Type 2 diabetes mellitus with hyperglycemia: Secondary | ICD-10-CM | POA: Diagnosis not present

## 2022-04-27 DIAGNOSIS — Z794 Long term (current) use of insulin: Secondary | ICD-10-CM

## 2022-04-27 DIAGNOSIS — I2510 Atherosclerotic heart disease of native coronary artery without angina pectoris: Secondary | ICD-10-CM

## 2022-04-27 DIAGNOSIS — F17200 Nicotine dependence, unspecified, uncomplicated: Secondary | ICD-10-CM

## 2022-04-27 DIAGNOSIS — G4734 Idiopathic sleep related nonobstructive alveolar hypoventilation: Secondary | ICD-10-CM | POA: Diagnosis not present

## 2022-04-27 MED ORDER — SYNJARDY XR 25-1000 MG PO TB24: 1.0000 | ORAL_TABLET | Freq: Every day | ORAL | 0 refills | Status: DC

## 2022-04-27 MED ORDER — NICOTINE 21 MG/24HR TD PT24: 21.0000 mg | MEDICATED_PATCH | Freq: Every day | TRANSDERMAL | 0 refills | Status: DC

## 2022-04-27 NOTE — Assessment & Plan Note (Signed)
Face-to-face visit completed today for recertification of her supplemental nighttime oxygen. Approve request.  We will fax orders.

## 2022-04-27 NOTE — Progress Notes (Signed)
Patient ID: [name], female    DOB: February 09, 1971, 51 y.o.   MRN: [id number]  Virtual visit completed through [place], a video enabled telemedicine application. Due to national recommendations of social distancing due to COVID-19, a virtual visit is felt to be most appropriate for this patient at this time. Reviewed limitations, risks, security and privacy concerns of performing a virtual visit and the availability of in person appointments. I also reviewed that there may be a patient responsible charge related to this service. The patient agreed to proceed.   Patient location: home Provider location: [place] at [facility], office Persons participating in this virtual visit: patient, provider, husband   If any vitals were documented, they were collected by patient at home unless specified below.    Ht '5\' 4"'$  (1.626 m)   Wt 220 lb (99.8 kg)   LMP  (LMP Unknown)   BMI 37.76 kg/m    CC: Oxygen orders and tobacco dependence Subjective:   HPI: [name] is a 51 y.o. female presenting on 04/27/2022 for Follow-up (Re-certification for oxygen //Patient needs refill on Synjardy)  She is also needing a refill of her Synjardy.   Today we are meeting as she is needing a face-to-face visit for recertification of her supplemental oxygen.  She's been managed on supplemental oxygen for which she wears at night since the age of 43 because of chronic tobacco abuse. She's been smoking since the age of 35. She wears supplemental oxygen at night as her saturations will drop in the "30's%".   Currently managed on 3 liters of supplemental oxygen HS or when she takes naps. She does not need supplemental oxygen during the day.  She has an oxygen concentrator and oxygen canisters for which is maintenance by [employer]. She has plenty of oxygen supply at home.    She checks her oxygen saturation during the day when on room air which typically runs 96%-99%.   She is also ready to quit smoking.  She was managed on Chantix in the past but caused hallucinations and irritability so she quickly stopped treatment and resumed smoking. She's also tried nicorette gum but has a hard time chewing gum because she doesn't have teeth. She is currently smoking 1.5 of cigarettes per day.  She has tried nicotine patches for which she has been successful.     Relevant past medical, surgical, family and social history reviewed and updated as indicated. Interim medical history since our last visit reviewed. Allergies and medications reviewed and updated. Outpatient Medications Prior to Visit  Medication Sig Dispense Refill   acetaminophen (TYLENOL) 325 MG tablet Take 2 tablets (650 mg total) by mouth every 4 (four) hours as needed for headache or mild pain.     albuterol (VENTOLIN HFA) 108 (90 Base) MCG/ACT inhaler Inhale 2 puffs into the lungs every 6 (six) hours as needed for wheezing or shortness of breath.     ALPRAZolam (XANAX) 1 MG tablet Take 1 tablet (1 mg total) by mouth 4 (four) times daily as needed for anxiety. 5 tablet 0   aspirin EC 81 MG tablet Take 81 mg by mouth daily before breakfast.     atorvastatin (LIPITOR) 10 MG tablet Take 1 tablet (10 mg total) by mouth daily. For cholesterol 90 tablet 3   budesonide-formoterol (SYMBICORT) 80-4.5 MCG/ACT inhaler INHALE 2 PUFFS BY MOUTH EVERY 12 HOURS TO PREVENT COUGH OR WHEEZING *RINSE MOUTH AFTER EACH USE* 10.2 g 2   desvenlafaxine (PRISTIQ) 100 MG 24 hr  tablet Take 100 mg by mouth daily.     insulin aspart (NOVOLOG) 100 UNIT/ML FlexPen Inject 10 units into the skin before breakfast and inject 10 units into the skin before dinner for diabetes. 15 mL 11   insulin glargine (LANTUS) 100 UNIT/ML Solostar Pen Inject 20 Units into the skin daily. 15 mL 11   naloxone (NARCAN) nasal spray 4 mg/0.1 mL SMARTSIG:Spray(s) In Nostril     ondansetron (ZOFRAN) 8 MG tablet One pill every 8 hours as needed for nausea/vomitting. 40 tablet 1   oxyCODONE (OXY  IR/ROXICODONE) 5 MG immediate release tablet Take 1 tablet (5 mg total) by mouth every 6 (six) hours as needed for severe pain. 30 tablet 0   OXYGEN Inhale 2-4 L into the lungs 2 (two) times daily as needed (shortness of breath).     pregabalin (LYRICA) 100 MG capsule Take 1 capsule (100 mg total) by mouth 2 (two) times daily. 60 capsule 0   rOPINIRole (REQUIP) 1 MG tablet TAKE 1 TABLET BY MOUTH ONCE DAILY AT BEDTIME FOR RESTLESS LEG 90 tablet 2   spironolactone (ALDACTONE) 50 MG tablet Take 50 mg by mouth daily.     topiramate (TOPAMAX) 50 MG tablet Take 1 tablet (50 mg total) by mouth daily. 30 tablet 0   traZODone (DESYREL) 50 MG tablet Take 1 tablet (50 mg total) by mouth at bedtime. 30 tablet 0   Empagliflozin-metFORMIN HCl ER (SYNJARDY XR) 25-1000 MG TB24 Take by mouth daily.     No facility-administered medications prior to visit.     Per HPI unless specifically indicated in ROS section below Review of Systems  Respiratory:  Negative for shortness of breath.   Cardiovascular:  Negative for chest pain.  Neurological:  Negative for dizziness.   Objective:  Ht '5\' 4"'$  (1.626 m)   Wt 220 lb (99.8 kg)   LMP  (LMP Unknown)   BMI 37.76 kg/m   Wt Readings from Last 3 Encounters:  04/27/22 220 lb (99.8 kg)  03/29/22 220 lb 7.4 oz (100 kg)  12/12/21 220 lb 7.4 oz (100 kg)       Physical exam: General: Alert and oriented x 3, no distress, does not appear sickly  Pulmonary: Speaks in complete sentences without increased work of breathing, no cough during visit.  Psychiatric: Normal mood, thought content, and behavior.     Results for orders placed or performed during the hospital encounter of 03/29/22  SARS Coronavirus 2 by RT PCR (hospital order, performed in [facility] hospital lab) *cepheid single result test* Anterior Nasal Swab   Specimen: Anterior Nasal Swab  Result Value Ref Range   SARS Coronavirus 2 by RT PCR NEGATIVE NEGATIVE  Blood culture (routine x 2)   Specimen:  BLOOD LEFT HAND  Result Value Ref Range   Specimen Description BLOOD LEFT HAND    Special Requests      BOTTLES DRAWN AEROBIC AND ANAEROBIC Blood Culture adequate volume   Culture      NO GROWTH 5 DAYS Performed at [facility], [address] [place], [place] [address]    Report Status 04/03/2022 FINAL   Blood culture (routine x 2)   Specimen: BLOOD LEFT FOREARM  Result Value Ref Range   Specimen Description BLOOD LEFT FOREARM    Special Requests      BOTTLES DRAWN AEROBIC AND ANAEROBIC Blood Culture adequate volume   Culture      NO GROWTH 5 DAYS Performed at [facility], [address] [place], [place] [address]    Report Status 04/03/2022 FINAL   MRSA Next Gen by PCR, Nasal  Result Value Ref Range   MRSA by PCR Next Gen DETECTED (A) NOT DETECTED  CBC with Differential  Result Value Ref Range   WBC 7.3 4.0 - 10.5 K/uL   RBC 5.09 3.87 - 5.11 MIL/uL   Hemoglobin 12.4 12.0 - 15.0 g/dL   HCT 39.8 36.0 - 46.0 %   MCV 78.2 (L) 80.0 - 100.0 fL   MCH 24.4 (L) 26.0 - 34.0 pg   MCHC 31.2 30.0 - 36.0 g/dL   RDW 19.1 (H) 11.5 - 15.5 %   Platelets 161 150 - 400 K/uL   nRBC 0.0 0.0 - 0.2 %   Neutrophils Relative % 72 %   Neutro Abs 5.3 1.7 - 7.7 K/uL   Lymphocytes Relative 16 %   Lymphs Abs 1.2 0.7 - 4.0 K/uL   Monocytes Relative 7 %   Monocytes Absolute 0.5 0.1 - 1.0 K/uL   Eosinophils Relative 4 %   Eosinophils Absolute 0.3 0.0 - 0.5 K/uL   Basophils Relative 1 %   Basophils Absolute 0.1 0.0 - 0.1 K/uL   Immature Granulocytes 0 %   Abs Immature Granulocytes 0.03 0.00 - 0.07 K/uL  Basic metabolic panel  Result Value Ref Range   Sodium 141 135 - 145 mmol/L   Potassium 4.3 3.5 - 5.1 mmol/L   Chloride 108 98 - 111 mmol/L   CO2 27 22 - 32 mmol/L   Glucose, Bld 226 (H) 70 - 99 mg/dL   BUN 23 (H) 6 - 20 mg/dL   Creatinine, Ser 0.96 0.44 - 1.00 mg/dL   Calcium 11.4 (H) 8.9 - 10.3 mg/dL   GFR, Estimated >60 >60 mL/min   Anion gap 6 5 - 15  Urinalysis,  Routine w reflex microscopic  Result Value Ref Range   Color, Urine AMBER (A) YELLOW   APPearance TURBID (A) CLEAR   Specific Gravity, Urine 1.011 1.005 - 1.030   pH 8.0 5.0 - 8.0   Glucose, UA 150 (A) NEGATIVE mg/dL   Hgb urine dipstick NEGATIVE NEGATIVE   Bilirubin Urine NEGATIVE NEGATIVE   Ketones, ur NEGATIVE NEGATIVE mg/dL   Protein, ur >=300 (A) NEGATIVE mg/dL   Nitrite NEGATIVE NEGATIVE   Leukocytes,Ua NEGATIVE NEGATIVE   RBC / HPF 0-5 0 - 5 RBC/hpf   WBC, UA 0-5 0 - 5 WBC/hpf   Bacteria, UA RARE (A) NONE SEEN   Squamous Epithelial / LPF 0-5 0 - 5  Hepatic function panel  Result Value Ref Range   Total Protein 8.1 6.5 - 8.1 g/dL   Albumin 2.8 (L) 3.5 - 5.0 g/dL   AST 23 15 - 41 U/L   ALT 12 0 - 44 U/L   Alkaline Phosphatase 118 38 - 126 U/L   Total Bilirubin 1.3 (H) 0.3 - 1.2 mg/dL   Bilirubin, Direct 0.4 (H) 0.0 - 0.2 mg/dL   Indirect Bilirubin 0.9 0.3 - 0.9 mg/dL  Blood gas, venous  Result Value Ref Range   pH, Ven 7.4 7.25 - 7.43   pCO2, Ven 45 44 - 60 mmHg   pO2, Ven 46 (H) 32 - 45 mmHg   Bicarbonate 27.9 20.0 - 28.0 mmol/L   Acid-Base Excess 2.5 (H) 0.0 - 2.0 mmol/L   O2 Saturation 79.7 %   Patient temperature 37.0   HIV Antibody (routine testing w rflx)  Result Value Ref Range   HIV Screen 4th  Generation wRfx Non Reactive Non Reactive  Basic metabolic panel  Result Value Ref Range   Sodium 139 135 - 145 mmol/L   Potassium 3.5 3.5 - 5.1 mmol/L   Chloride 111 98 - 111 mmol/L   CO2 24 22 - 32 mmol/L   Glucose, Bld 186 (H) 70 - 99 mg/dL   BUN 18 6 - 20 mg/dL   Creatinine, Ser 0.68 0.44 - 1.00 mg/dL   Calcium 11.4 (H) 8.9 - 10.3 mg/dL   GFR, Estimated >60 >60 mL/min   Anion gap 4 (L) 5 - 15  Parathyroid hormone, intact (no Ca)  Result Value Ref Range   PTH 7 (L) 15 - 65 pg/mL  PTH-related peptide  Result Value Ref Range   PTH-related peptide <2.0 pmol/L  Glucose, capillary  Result Value Ref Range   Glucose-Capillary 197 (H) 70 - 99 mg/dL  Glucose,  capillary  Result Value Ref Range   Glucose-Capillary 213 (H) 70 - 99 mg/dL  TSH  Result Value Ref Range   TSH 0.879 0.350 - 4.500 uIU/mL  T4, free  Result Value Ref Range   Free T4 0.79 0.61 - 1.12 ng/dL  Vitamin B12  Result Value Ref Range   Vitamin B-12 307 180 - 914 pg/mL  Vitamin B1  Result Value Ref Range   Vitamin B1 (Thiamine) 113.2 66.5 - 200.0 nmol/L  Ammonia  Result Value Ref Range   Ammonia 46 (H) 9 - 35 umol/L  Glucose, capillary  Result Value Ref Range   Glucose-Capillary 167 (H) 70 - 99 mg/dL  Glucose, capillary  Result Value Ref Range   Glucose-Capillary 210 (H) 70 - 99 mg/dL  Basic metabolic panel  Result Value Ref Range   Sodium 138 135 - 145 mmol/L   Potassium 3.3 (L) 3.5 - 5.1 mmol/L   Chloride 112 (H) 98 - 111 mmol/L   CO2 22 22 - 32 mmol/L   Glucose, Bld 173 (H) 70 - 99 mg/dL   BUN 14 6 - 20 mg/dL   Creatinine, Ser 0.58 0.44 - 1.00 mg/dL   Calcium 11.3 (H) 8.9 - 10.3 mg/dL   GFR, Estimated >60 >60 mL/min   Anion gap 4 (L) 5 - 15  CBC  Result Value Ref Range   WBC 6.1 4.0 - 10.5 K/uL   RBC 5.02 3.87 - 5.11 MIL/uL   Hemoglobin 12.1 12.0 - 15.0 g/dL   HCT 37.3 36.0 - 46.0 %   MCV 74.3 (L) 80.0 - 100.0 fL   MCH 24.1 (L) 26.0 - 34.0 pg   MCHC 32.4 30.0 - 36.0 g/dL   RDW 18.6 (H) 11.5 - 15.5 %   Platelets 187 150 - 400 K/uL   nRBC 0.0 0.0 - 0.2 %  Magnesium  Result Value Ref Range   Magnesium 1.8 1.7 - 2.4 mg/dL  Glucose, capillary  Result Value Ref Range   Glucose-Capillary 172 (H) 70 - 99 mg/dL  Glucose, capillary  Result Value Ref Range   Glucose-Capillary 202 (H) 70 - 99 mg/dL  Glucose, capillary  Result Value Ref Range   Glucose-Capillary 161 (H) 70 - 99 mg/dL  Glucose, capillary  Result Value Ref Range   Glucose-Capillary 138 (H) 70 - 99 mg/dL  Basic metabolic panel  Result Value Ref Range   Sodium 140 135 - 145 mmol/L   Potassium 3.3 (L) 3.5 - 5.1 mmol/L   Chloride 111 98 - 111 mmol/L   CO2 22 22 - 32 mmol/L   Glucose, Bld  136 (H) 70 - 99 mg/dL   BUN 16 6 - 20 mg/dL   Creatinine, Ser 0.63 0.44 - 1.00 mg/dL   Calcium 11.8 (H) 8.9 - 10.3 mg/dL   GFR, Estimated >60 >60 mL/min   Anion gap 7 5 - 15  CBC  Result Value Ref Range   WBC 5.8 4.0 - 10.5 K/uL   RBC 4.83 3.87 - 5.11 MIL/uL   Hemoglobin 11.6 (L) 12.0 - 15.0 g/dL   HCT 35.9 (L) 36.0 - 46.0 %   MCV 74.3 (L) 80.0 - 100.0 fL   MCH 24.0 (L) 26.0 - 34.0 pg   MCHC 32.3 30.0 - 36.0 g/dL   RDW 18.4 (H) 11.5 - 15.5 %   Platelets 178 150 - 400 K/uL   nRBC 0.0 0.0 - 0.2 %  Magnesium  Result Value Ref Range   Magnesium 1.8 1.7 - 2.4 mg/dL  Glucose, capillary  Result Value Ref Range   Glucose-Capillary 118 (H) 70 - 99 mg/dL  Ammonia  Result Value Ref Range   Ammonia 32 9 - 35 umol/L  Glucose, capillary  Result Value Ref Range   Glucose-Capillary 154 (H) 70 - 99 mg/dL  Glucose, capillary  Result Value Ref Range   Glucose-Capillary 125 (H) 70 - 99 mg/dL  Glucose, capillary  Result Value Ref Range   Glucose-Capillary 131 (H) 70 - 99 mg/dL  Basic metabolic panel  Result Value Ref Range   Sodium 140 135 - 145 mmol/L   Potassium 3.1 (L) 3.5 - 5.1 mmol/L   Chloride 113 (H) 98 - 111 mmol/L   CO2 20 (L) 22 - 32 mmol/L   Glucose, Bld 150 (H) 70 - 99 mg/dL   BUN 12 6 - 20 mg/dL   Creatinine, Ser 0.52 0.44 - 1.00 mg/dL   Calcium 11.3 (H) 8.9 - 10.3 mg/dL   GFR, Estimated >60 >60 mL/min   Anion gap 7 5 - 15  CBC  Result Value Ref Range   WBC 8.3 4.0 - 10.5 K/uL   RBC 5.29 (H) 3.87 - 5.11 MIL/uL   Hemoglobin 12.9 12.0 - 15.0 g/dL   HCT 39.1 36.0 - 46.0 %   MCV 73.9 (L) 80.0 - 100.0 fL   MCH 24.4 (L) 26.0 - 34.0 pg   MCHC 33.0 30.0 - 36.0 g/dL   RDW 18.3 (H) 11.5 - 15.5 %   Platelets 182 150 - 400 K/uL   nRBC 0.0 0.0 - 0.2 %  Magnesium  Result Value Ref Range   Magnesium 1.7 1.7 - 2.4 mg/dL  Comprehensive metabolic panel  Result Value Ref Range   Sodium 140 135 - 145 mmol/L   Potassium 3.6 3.5 - 5.1 mmol/L   Chloride 114 (H) 98 - 111 mmol/L    CO2 23 22 - 32 mmol/L   Glucose, Bld 107 (H) 70 - 99 mg/dL   BUN 14 6 - 20 mg/dL   Creatinine, Ser 0.60 0.44 - 1.00 mg/dL   Calcium 11.6 (H) 8.9 - 10.3 mg/dL   Total Protein 7.8 6.5 - 8.1 g/dL   Albumin 2.7 (L) 3.5 - 5.0 g/dL   AST 18 15 - 41 U/L   ALT 13 0 - 44 U/L   Alkaline Phosphatase 114 38 - 126 U/L   Total Bilirubin 0.7 0.3 - 1.2 mg/dL   GFR, Estimated >60 >60 mL/min   Anion gap 3 (L) 5 - 15  Glucose, capillary  Result Value Ref Range   Glucose-Capillary 122 (H) 70 -  99 mg/dL  Glucose, capillary  Result Value Ref Range   Glucose-Capillary 140 (H) 70 - 99 mg/dL  Glucose, capillary  Result Value Ref Range   Glucose-Capillary 149 (H) 70 - 99 mg/dL  Basic metabolic panel  Result Value Ref Range   Sodium 140 135 - 145 mmol/L   Potassium 3.5 3.5 - 5.1 mmol/L   Chloride 114 (H) 98 - 111 mmol/L   CO2 20 (L) 22 - 32 mmol/L   Glucose, Bld 130 (H) 70 - 99 mg/dL   BUN 11 6 - 20 mg/dL   Creatinine, Ser 0.49 0.44 - 1.00 mg/dL   Calcium 10.9 (H) 8.9 - 10.3 mg/dL   GFR, Estimated >60 >60 mL/min   Anion gap 6 5 - 15  Glucose, capillary  Result Value Ref Range   Glucose-Capillary 126 (H) 70 - 99 mg/dL  CBG monitoring, ED  Result Value Ref Range   Glucose-Capillary 226 (H) 70 - 99 mg/dL  Troponin I (High Sensitivity)  Result Value Ref Range   Troponin I (High Sensitivity) 7 <18 ng/L  Troponin I (High Sensitivity)  Result Value Ref Range   Troponin I (High Sensitivity) 7 <18 ng/L   *Note: Due to a large number of results and/or encounters for the requested time period, some results have not been displayed. A complete set of results can be found in Results Review.   Assessment & Plan:   Problem List Items Addressed This Visit       Respiratory   Chronic respiratory failure with hypoxia (HCC)/ nocturnal 02 dep    Nocturnal hypoxemia - Primary    Face-to-face visit completed today for recertification of her supplemental nighttime oxygen. Approve request.  We will fax  orders.        Endocrine   Uncontrolled type 2 diabetes mellitus with hyperglycemia, with long-term current use of insulin (HCC)   Relevant Medications   Empagliflozin-metFORMIN HCl ER (SYNJARDY XR) 25-1000 MG TB24     Other   Tobacco dependence    Ready to quit.  Discussed options, we decided to pursue nicotine patches for which she has been successful with in the past. Start nicotine patches 21 mcg daily.  She understands to rotate sites of patch daily.  She will call us when she is running low for 30-day supply to see if we need to reduce her dose versus continue the 21 mcg dose.  Commended her on wanting to quit.      Relevant Medications   nicotine (NICODERM CQ - DOSED IN MG/24 HOURS) 21 mg/24hr patch     Meds ordered this encounter  Medications   Empagliflozin-metFORMIN HCl ER (SYNJARDY XR) 25-1000 MG TB24    Sig: Take 1 tablet by mouth daily. For diabetes.    Dispense:  90 tablet    Refill:  0    Order Specific Question:   Supervising Provider    Answer:   BEDSOLE, AMY E [[id number]]   nicotine (NICODERM CQ - DOSED IN MG/24 HOURS) 21 mg/24hr patch    Sig: Place 1 patch (21 mg total) onto the skin daily.    Dispense:  28 patch    Refill:  0    Order Specific Question:   Supervising Provider    Answer:   BEDSOLE, AMY E [[id number]]   No orders of the defined types were placed in this encounter.   I discussed the assessment and treatment plan with the patient. The patient was provided an opportunity to ask questions and all  were answered. The patient agreed with the plan and demonstrated an understanding of the instructions. The patient was advised to call back or seek an in-person evaluation if the symptoms worsen or if the condition fails to improve as anticipated.  Follow up plan:  Start nicotine patches daily as discussed.  We will complete your supplemental oxygen order.  See you in November!  Pleas Koch, NP

## 2022-04-27 NOTE — Telephone Encounter (Signed)
Nicotine patches were sent electronically to [facility] on [address] just after our visit today.  Has she contacted the pharmacy?

## 2022-04-27 NOTE — Patient Instructions (Signed)
Start nicotine patches daily as discussed.  We will complete your supplemental oxygen order.  See you in November!

## 2022-04-27 NOTE — Assessment & Plan Note (Signed)
Ready to quit.  Discussed options, we decided to pursue nicotine patches for which she has been successful with in the past. Start nicotine patches 21 mcg daily.  She understands to rotate sites of patch daily.  She will call us when she is running low for 30-day supply to see if we need to reduce her dose versus continue the 21 mcg dose.  Commended her on wanting to quit.

## 2022-04-27 NOTE — Telephone Encounter (Signed)
Pt called stated RX for nicotine patches  was not called in to pharmacy as discuss at visit . Please advise [phone]

## 2022-04-27 NOTE — Telephone Encounter (Signed)
Advised patient patches were sent in. She will call pharmacy back tomorrow and check.

## 2022-04-28 ENCOUNTER — Telehealth: Payer: Self-pay | Admitting: Primary Care

## 2022-04-28 NOTE — Telephone Encounter (Signed)
Patient called stating that [name] said that they not receive her rx for nicotine (NICODERM CQ - DOSED IN MG/24 HOURS) 21 mg/24hr patch.

## 2022-04-28 NOTE — Telephone Encounter (Signed)
Called pharmacy, verified they received the prescription but that it is not covered by patients insurance. Called patient and advised the patches arent covered by her insurance, she will call pharmacy and see what the OOP is. Advised she can get over the counter.

## 2022-05-02 DIAGNOSIS — E782 Mixed hyperlipidemia: Secondary | ICD-10-CM | POA: Diagnosis not present

## 2022-05-02 DIAGNOSIS — F319 Bipolar disorder, unspecified: Secondary | ICD-10-CM | POA: Diagnosis not present

## 2022-05-02 DIAGNOSIS — I872 Venous insufficiency (chronic) (peripheral): Secondary | ICD-10-CM | POA: Diagnosis not present

## 2022-05-02 DIAGNOSIS — E1151 Type 2 diabetes mellitus with diabetic peripheral angiopathy without gangrene: Secondary | ICD-10-CM | POA: Diagnosis not present

## 2022-05-02 DIAGNOSIS — I5032 Chronic diastolic (congestive) heart failure: Secondary | ICD-10-CM | POA: Diagnosis not present

## 2022-05-02 DIAGNOSIS — F419 Anxiety disorder, unspecified: Secondary | ICD-10-CM | POA: Diagnosis not present

## 2022-05-02 DIAGNOSIS — E1169 Type 2 diabetes mellitus with other specified complication: Secondary | ICD-10-CM | POA: Diagnosis not present

## 2022-05-02 DIAGNOSIS — I130 Hypertensive heart and chronic kidney disease with heart failure and stage 1 through stage 4 chronic kidney disease, or unspecified chronic kidney disease: Secondary | ICD-10-CM | POA: Diagnosis not present

## 2022-05-02 DIAGNOSIS — N1831 Chronic kidney disease, stage 3a: Secondary | ICD-10-CM | POA: Diagnosis not present

## 2022-05-02 DIAGNOSIS — D631 Anemia in chronic kidney disease: Secondary | ICD-10-CM | POA: Diagnosis not present

## 2022-05-02 DIAGNOSIS — G9341 Metabolic encephalopathy: Secondary | ICD-10-CM | POA: Diagnosis not present

## 2022-05-02 DIAGNOSIS — E1122 Type 2 diabetes mellitus with diabetic chronic kidney disease: Secondary | ICD-10-CM | POA: Diagnosis not present

## 2022-05-05 ENCOUNTER — Telehealth: Payer: Self-pay

## 2022-05-05 NOTE — Telephone Encounter (Signed)
Patient called in requesting a prescription for a yeast infection. Informed patient she needs an appointment for evaluation to get medication. Patient states her husband Kennith Center is coming in for an appointment tomorrow morning 10/27 @ 8:40 and is requesting to be worked in with his appointment because it is hard for her to get to the office.  Would you like to work her in or offer next available appointment?

## 2022-05-06 ENCOUNTER — Telehealth (INDEPENDENT_AMBULATORY_CARE_PROVIDER_SITE_OTHER): Payer: BC Managed Care – PPO | Admitting: Primary Care

## 2022-05-06 ENCOUNTER — Encounter: Payer: Self-pay | Admitting: Primary Care

## 2022-05-06 DIAGNOSIS — N898 Other specified noninflammatory disorders of vagina: Secondary | ICD-10-CM

## 2022-05-06 DIAGNOSIS — R21 Rash and other nonspecific skin eruption: Secondary | ICD-10-CM

## 2022-05-06 DIAGNOSIS — R112 Nausea with vomiting, unspecified: Secondary | ICD-10-CM | POA: Diagnosis not present

## 2022-05-06 DIAGNOSIS — I2510 Atherosclerotic heart disease of native coronary artery without angina pectoris: Secondary | ICD-10-CM

## 2022-05-06 MED ORDER — ONDANSETRON HCL 8 MG PO TABS: ORAL_TABLET | ORAL | 0 refills | Status: AC

## 2022-05-06 MED ORDER — FLUCONAZOLE 150 MG PO TABS: ORAL_TABLET | ORAL | 0 refills | Status: DC

## 2022-05-06 NOTE — Telephone Encounter (Signed)
I'm so sorry, I cannot work her in today in person. We can add her on at the 3:40 slot for a virtual visit since it's so tough for her to get in.

## 2022-05-06 NOTE — Assessment & Plan Note (Signed)
Although exam is difficult given the virtual platform, symptoms do seem to represent vaginitis.  Fluconazole 150 mg tablet sent to pharmacy.  Start 1 tablet today, repeat in 3 days.  I have asked that she follow-up in the office if no improvement with symptoms.

## 2022-05-06 NOTE — Telephone Encounter (Signed)
Unable to reach patient. Left voicemail to return call to our office.   

## 2022-05-06 NOTE — Patient Instructions (Signed)
Take the fluconazole yeast pill today and take the second fluconazole use pill in 3 days.  Please come see me if your symptoms persist.  It was a pleasure to see you today!

## 2022-05-06 NOTE — Progress Notes (Signed)
Patient ID: [name], female    DOB: [birth year], 51 y.o.   MRN: [id number]  Virtual visit completed through [place], a video enabled telemedicine application. Due to national recommendations of social distancing due to COVID-19, a virtual visit is felt to be most appropriate for this patient at this time. Reviewed limitations, risks, security and privacy concerns of performing a virtual visit and the availability of in person appointments. I also reviewed that there may be a patient responsible charge related to this service. The patient agreed to proceed.   Patient location: home Provider location: [place] at [facility], office Persons participating in this virtual visit: patient, provider   If any vitals were documented, they were collected by patient at home unless specified below.    LMP  (LMP Unknown)    CC: Rash and vaginal itching Subjective:   HPI: [name] is a 51 y.o. female presenting on 05/06/2022 for Vaginitis (X 10 days  on butt pt is using baby rash cream. The vagina she is using Monistat  7. She states that her husband has on his belly as well.)   She believes she has a "yeast" infection. Symptoms include vaginal itching, groin itching, rash to the vagina and groin. Symptoms began about 4-5 days ago. She's been using Monistat 7 with temporary improvement.    She denies vaginal discharge, dysuria. she believes that her last dose of antibiotics was in September 2023.   She is also requesting a refill of her Zofran.       Relevant past medical, surgical, family and social history reviewed and updated as indicated. Interim medical history since our last visit reviewed. Allergies and medications reviewed and updated. Outpatient Medications Prior to Visit  Medication Sig Dispense Refill   acetaminophen (TYLENOL) 325 MG tablet Take 2 tablets (650 mg total) by mouth every 4 (four) hours as needed for headache or mild pain.     albuterol (VENTOLIN HFA) 108 (90  Base) MCG/ACT inhaler Inhale 2 puffs into the lungs every 6 (six) hours as needed for wheezing or shortness of breath.     ALPRAZolam (XANAX) 1 MG tablet Take 1 tablet (1 mg total) by mouth 4 (four) times daily as needed for anxiety. 5 tablet 0   aspirin EC 81 MG tablet Take 81 mg by mouth daily before breakfast.     atorvastatin (LIPITOR) 10 MG tablet Take 1 tablet (10 mg total) by mouth daily. For cholesterol 90 tablet 3   budesonide-formoterol (SYMBICORT) 80-4.5 MCG/ACT inhaler INHALE 2 PUFFS BY MOUTH EVERY 12 HOURS TO PREVENT COUGH OR WHEEZING *RINSE MOUTH AFTER EACH USE* 10.2 g 2   desvenlafaxine (PRISTIQ) 100 MG 24 hr tablet Take 100 mg by mouth daily.     Empagliflozin-metFORMIN HCl ER (SYNJARDY XR) 25-1000 MG TB24 Take 1 tablet by mouth daily. For diabetes. 90 tablet 0   insulin aspart (NOVOLOG) 100 UNIT/ML FlexPen Inject 10 units into the skin before breakfast and inject 10 units into the skin before dinner for diabetes. 15 mL 11   insulin glargine (LANTUS) 100 UNIT/ML Solostar Pen Inject 20 Units into the skin daily. 15 mL 11   naloxone (NARCAN) nasal spray 4 mg/0.1 mL SMARTSIG:Spray(s) In Nostril     nicotine (NICODERM CQ - DOSED IN MG/24 HOURS) 21 mg/24hr patch Place 1 patch (21 mg total) onto the skin daily. 28 patch 0   oxyCODONE (OXY IR/ROXICODONE) 5 MG immediate release tablet Take 1 tablet (5 mg total) by mouth every 6 (six)  hours as needed for severe pain. 30 tablet 0   OXYGEN Inhale 2-4 L into the lungs 2 (two) times daily as needed (shortness of breath).     pregabalin (LYRICA) 100 MG capsule Take 1 capsule (100 mg total) by mouth 2 (two) times daily. 60 capsule 0   rOPINIRole (REQUIP) 1 MG tablet TAKE 1 TABLET BY MOUTH ONCE DAILY AT BEDTIME FOR RESTLESS LEG 90 tablet 2   spironolactone (ALDACTONE) 50 MG tablet Take 50 mg by mouth daily.     topiramate (TOPAMAX) 50 MG tablet Take 1 tablet (50 mg total) by mouth daily. 30 tablet 0   traZODone (DESYREL) 50 MG tablet Take 1 tablet  (50 mg total) by mouth at bedtime. 30 tablet 0   ondansetron (ZOFRAN) 8 MG tablet One pill every 8 hours as needed for nausea/vomitting. 40 tablet 1   No facility-administered medications prior to visit.     Per HPI unless specifically indicated in ROS section below Review of Systems  Genitourinary:  Negative for frequency and vaginal discharge.       Vaginal itching  Skin:  Positive for rash.   Objective:  LMP  (LMP Unknown)   Wt Readings from Last 3 Encounters:  04/27/22 220 lb (99.8 kg)  03/29/22 220 lb 7.4 oz (100 kg)  12/12/21 220 lb 7.4 oz (100 kg)       Physical exam: General: Alert and oriented x 3, no distress, does not appear sickly  Pulmonary: Speaks in complete sentences without increased work of breathing, no cough during visit.  Psychiatric: Normal mood, thought content, and behavior.  Skin: Exam was very difficult, but she did appear to have erythema to the left groin and labia majora.      Results for orders placed or performed during the hospital encounter of 03/29/22  SARS Coronavirus 2 by RT PCR (hospital order, performed in [facility] hospital lab) *cepheid single result test* Anterior Nasal Swab   Specimen: Anterior Nasal Swab  Result Value Ref Range   SARS Coronavirus 2 by RT PCR NEGATIVE NEGATIVE  Blood culture (routine x 2)   Specimen: BLOOD LEFT HAND  Result Value Ref Range   Specimen Description BLOOD LEFT HAND    Special Requests      BOTTLES DRAWN AEROBIC AND ANAEROBIC Blood Culture adequate volume   Culture      NO GROWTH 5 DAYS Performed at [facility], [address] [place], [place] [address]    Report Status 04/03/2022 FINAL   Blood culture (routine x 2)   Specimen: BLOOD LEFT FOREARM  Result Value Ref Range   Specimen Description BLOOD LEFT FOREARM    Special Requests      BOTTLES DRAWN AEROBIC AND ANAEROBIC Blood Culture adequate volume   Culture      NO GROWTH 5 DAYS Performed at [facility], [address] [place],  [address]    Report Status 04/03/2022 FINAL   MRSA Next Gen by PCR, Nasal  Result Value Ref Range   MRSA by PCR Next Gen DETECTED (A) NOT DETECTED  CBC with Differential  Result Value Ref Range   WBC 7.3 4.0 - 10.5 K/uL   RBC 5.09 3.87 - 5.11 MIL/uL   Hemoglobin 12.4 12.0 - 15.0 g/dL   HCT 39.8 36.0 - 46.0 %   MCV 78.2 (L) 80.0 - 100.0 fL   MCH 24.4 (L) 26.0 - 34.0 pg   MCHC 31.2 30.0 - 36.0 g/dL   RDW 19.1 (  H) 11.5 - 15.5 %   Platelets 161 150 - 400 K/uL   nRBC 0.0 0.0 - 0.2 %   Neutrophils Relative % 72 %   Neutro Abs 5.3 1.7 - 7.7 K/uL   Lymphocytes Relative 16 %   Lymphs Abs 1.2 0.7 - 4.0 K/uL   Monocytes Relative 7 %   Monocytes Absolute 0.5 0.1 - 1.0 K/uL   Eosinophils Relative 4 %   Eosinophils Absolute 0.3 0.0 - 0.5 K/uL   Basophils Relative 1 %   Basophils Absolute 0.1 0.0 - 0.1 K/uL   Immature Granulocytes 0 %   Abs Immature Granulocytes 0.03 0.00 - 0.07 K/uL  Basic metabolic panel  Result Value Ref Range   Sodium 141 135 - 145 mmol/L   Potassium 4.3 3.5 - 5.1 mmol/L   Chloride 108 98 - 111 mmol/L   CO2 27 22 - 32 mmol/L   Glucose, Bld 226 (H) 70 - 99 mg/dL   BUN 23 (H) 6 - 20 mg/dL   Creatinine, Ser 0.96 0.44 - 1.00 mg/dL   Calcium 11.4 (H) 8.9 - 10.3 mg/dL   GFR, Estimated >60 >60 mL/min   Anion gap 6 5 - 15  Urinalysis, Routine w reflex microscopic  Result Value Ref Range   Color, Urine AMBER (A) YELLOW   APPearance TURBID (A) CLEAR   Specific Gravity, Urine 1.011 1.005 - 1.030   pH 8.0 5.0 - 8.0   Glucose, UA 150 (A) NEGATIVE mg/dL   Hgb urine dipstick NEGATIVE NEGATIVE   Bilirubin Urine NEGATIVE NEGATIVE   Ketones, ur NEGATIVE NEGATIVE mg/dL   Protein, ur >=300 (A) NEGATIVE mg/dL   Nitrite NEGATIVE NEGATIVE   Leukocytes,Ua NEGATIVE NEGATIVE   RBC / HPF 0-5 0 - 5 RBC/hpf   WBC, UA 0-5 0 - 5 WBC/hpf   Bacteria, UA RARE (A) NONE SEEN   Squamous Epithelial / LPF 0-5 0 - 5  Hepatic function panel  Result Value Ref  Range   Total Protein 8.1 6.5 - 8.1 g/dL   Albumin 2.8 (L) 3.5 - 5.0 g/dL   AST 23 15 - 41 U/L   ALT 12 0 - 44 U/L   Alkaline Phosphatase 118 38 - 126 U/L   Total Bilirubin 1.3 (H) 0.3 - 1.2 mg/dL   Bilirubin, Direct 0.4 (H) 0.0 - 0.2 mg/dL   Indirect Bilirubin 0.9 0.3 - 0.9 mg/dL  Blood gas, venous  Result Value Ref Range   pH, Ven 7.4 7.25 - 7.43   pCO2, Ven 45 44 - 60 mmHg   pO2, Ven 46 (H) 32 - 45 mmHg   Bicarbonate 27.9 20.0 - 28.0 mmol/L   Acid-Base Excess 2.5 (H) 0.0 - 2.0 mmol/L   O2 Saturation 79.7 %   Patient temperature 37.0   HIV Antibody (routine testing w rflx)  Result Value Ref Range   HIV Screen 4th Generation wRfx Non Reactive Non Reactive  Basic metabolic panel  Result Value Ref Range   Sodium 139 135 - 145 mmol/L   Potassium 3.5 3.5 - 5.1 mmol/L   Chloride 111 98 - 111 mmol/L   CO2 24 22 - 32 mmol/L   Glucose, Bld 186 (H) 70 - 99 mg/dL   BUN 18 6 - 20 mg/dL   Creatinine, Ser 0.68 0.44 - 1.00 mg/dL   Calcium 11.4 (H) 8.9 - 10.3 mg/dL   GFR, Estimated >60 >60 mL/min   Anion gap 4 (L) 5 - 15  Parathyroid hormone, intact (no Ca)  Result Value Ref Range   PTH 7 (L) 15 - 65 pg/mL  PTH-related peptide  Result Value Ref Range   PTH-related peptide <2.0 pmol/L  Glucose, capillary  Result Value Ref Range   Glucose-Capillary 197 (H) 70 - 99 mg/dL  Glucose, capillary  Result Value Ref Range   Glucose-Capillary 213 (H) 70 - 99 mg/dL  TSH  Result Value Ref Range   TSH 0.879 0.350 - 4.500 uIU/mL  T4, free  Result Value Ref Range   Free T4 0.79 0.61 - 1.12 ng/dL  Vitamin B12  Result Value Ref Range   Vitamin B-12 307 180 - 914 pg/mL  Vitamin B1  Result Value Ref Range   Vitamin B1 (Thiamine) 113.2 66.5 - 200.0 nmol/L  Ammonia  Result Value Ref Range   Ammonia 46 (H) 9 - 35 umol/L  Glucose, capillary  Result Value Ref Range   Glucose-Capillary 167 (H) 70 - 99 mg/dL  Glucose, capillary  Result Value Ref Range   Glucose-Capillary 210 (H) 70 - 99 mg/dL   Basic metabolic panel  Result Value Ref Range   Sodium 138 135 - 145 mmol/L   Potassium 3.3 (L) 3.5 - 5.1 mmol/L   Chloride 112 (H) 98 - 111 mmol/L   CO2 22 22 - 32 mmol/L   Glucose, Bld 173 (H) 70 - 99 mg/dL   BUN 14 6 - 20 mg/dL   Creatinine, Ser 0.58 0.44 - 1.00 mg/dL   Calcium 11.3 (H) 8.9 - 10.3 mg/dL   GFR, Estimated >60 >60 mL/min   Anion gap 4 (L) 5 - 15  CBC  Result Value Ref Range   WBC 6.1 4.0 - 10.5 K/uL   RBC 5.02 3.87 - 5.11 MIL/uL   Hemoglobin 12.1 12.0 - 15.0 g/dL   HCT 37.3 36.0 - 46.0 %   MCV 74.3 (L) 80.0 - 100.0 fL   MCH 24.1 (L) 26.0 - 34.0 pg   MCHC 32.4 30.0 - 36.0 g/dL   RDW 18.6 (H) 11.5 - 15.5 %   Platelets 187 150 - 400 K/uL   nRBC 0.0 0.0 - 0.2 %  Magnesium  Result Value Ref Range   Magnesium 1.8 1.7 - 2.4 mg/dL  Glucose, capillary  Result Value Ref Range   Glucose-Capillary 172 (H) 70 - 99 mg/dL  Glucose, capillary  Result Value Ref Range   Glucose-Capillary 202 (H) 70 - 99 mg/dL  Glucose, capillary  Result Value Ref Range   Glucose-Capillary 161 (H) 70 - 99 mg/dL  Glucose, capillary  Result Value Ref Range   Glucose-Capillary 138 (H) 70 - 99 mg/dL  Basic metabolic panel  Result Value Ref Range   Sodium 140 135 - 145 mmol/L   Potassium 3.3 (L) 3.5 - 5.1 mmol/L   Chloride 111 98 - 111 mmol/L   CO2 22 22 - 32 mmol/L   Glucose, Bld 136 (H) 70 - 99 mg/dL   BUN 16 6 - 20 mg/dL   Creatinine, Ser 0.63 0.44 - 1.00 mg/dL   Calcium 11.8 (H) 8.9 - 10.3 mg/dL   GFR, Estimated >60 >60 mL/min   Anion gap 7 5 - 15  CBC  Result Value Ref Range   WBC 5.8 4.0 - 10.5 K/uL   RBC 4.83 3.87 - 5.11 MIL/uL   Hemoglobin 11.6 (L) 12.0 - 15.0 g/dL   HCT 35.9 (L) 36.0 - 46.0 %   MCV 74.3 (L) 80.0 - 100.0 fL   MCH 24.0 (L) 26.0 - 34.0 pg  MCHC 32.3 30.0 - 36.0 g/dL   RDW 18.4 (H) 11.5 - 15.5 %   Platelets 178 150 - 400 K/uL   nRBC 0.0 0.0 - 0.2 %  Magnesium  Result Value Ref Range   Magnesium 1.8 1.7 - 2.4 mg/dL  Glucose, capillary  Result Value  Ref Range   Glucose-Capillary 118 (H) 70 - 99 mg/dL  Ammonia  Result Value Ref Range   Ammonia 32 9 - 35 umol/L  Glucose, capillary  Result Value Ref Range   Glucose-Capillary 154 (H) 70 - 99 mg/dL  Glucose, capillary  Result Value Ref Range   Glucose-Capillary 125 (H) 70 - 99 mg/dL  Glucose, capillary  Result Value Ref Range   Glucose-Capillary 131 (H) 70 - 99 mg/dL  Basic metabolic panel  Result Value Ref Range   Sodium 140 135 - 145 mmol/L   Potassium 3.1 (L) 3.5 - 5.1 mmol/L   Chloride 113 (H) 98 - 111 mmol/L   CO2 20 (L) 22 - 32 mmol/L   Glucose, Bld 150 (H) 70 - 99 mg/dL   BUN 12 6 - 20 mg/dL   Creatinine, Ser 0.52 0.44 - 1.00 mg/dL   Calcium 11.3 (H) 8.9 - 10.3 mg/dL   GFR, Estimated >60 >60 mL/min   Anion gap 7 5 - 15  CBC  Result Value Ref Range   WBC 8.3 4.0 - 10.5 K/uL   RBC 5.29 (H) 3.87 - 5.11 MIL/uL   Hemoglobin 12.9 12.0 - 15.0 g/dL   HCT 39.1 36.0 - 46.0 %   MCV 73.9 (L) 80.0 - 100.0 fL   MCH 24.4 (L) 26.0 - 34.0 pg   MCHC 33.0 30.0 - 36.0 g/dL   RDW 18.3 (H) 11.5 - 15.5 %   Platelets 182 150 - 400 K/uL   nRBC 0.0 0.0 - 0.2 %  Magnesium  Result Value Ref Range   Magnesium 1.7 1.7 - 2.4 mg/dL  Comprehensive metabolic panel  Result Value Ref Range   Sodium 140 135 - 145 mmol/L   Potassium 3.6 3.5 - 5.1 mmol/L   Chloride 114 (H) 98 - 111 mmol/L   CO2 23 22 - 32 mmol/L   Glucose, Bld 107 (H) 70 - 99 mg/dL   BUN 14 6 - 20 mg/dL   Creatinine, Ser 0.60 0.44 - 1.00 mg/dL   Calcium 11.6 (H) 8.9 - 10.3 mg/dL   Total Protein 7.8 6.5 - 8.1 g/dL   Albumin 2.7 (L) 3.5 - 5.0 g/dL   AST 18 15 - 41 U/L   ALT 13 0 - 44 U/L   Alkaline Phosphatase 114 38 - 126 U/L   Total Bilirubin 0.7 0.3 - 1.2 mg/dL   GFR, Estimated >60 >60 mL/min   Anion gap 3 (L) 5 - 15  Glucose, capillary  Result Value Ref Range   Glucose-Capillary 122 (H) 70 - 99 mg/dL  Glucose, capillary  Result Value Ref Range   Glucose-Capillary 140 (H) 70 - 99 mg/dL  Glucose, capillary  Result  Value Ref Range   Glucose-Capillary 149 (H) 70 - 99 mg/dL  Basic metabolic panel  Result Value Ref Range   Sodium 140 135 - 145 mmol/L   Potassium 3.5 3.5 - 5.1 mmol/L   Chloride 114 (H) 98 - 111 mmol/L   CO2 20 (L) 22 - 32 mmol/L   Glucose, Bld 130 (H) 70 - 99 mg/dL   BUN 11 6 - 20 mg/dL   Creatinine, Ser 0.49 0.44 - 1.00 mg/dL  Calcium 10.9 (H) 8.9 - 10.3 mg/dL   GFR, Estimated >60 >60 mL/min   Anion gap 6 5 - 15  Glucose, capillary  Result Value Ref Range   Glucose-Capillary 126 (H) 70 - 99 mg/dL  CBG monitoring, ED  Result Value Ref Range   Glucose-Capillary 226 (H) 70 - 99 mg/dL  Troponin I (High Sensitivity)  Result Value Ref Range   Troponin I (High Sensitivity) 7 <18 ng/L  Troponin I (High Sensitivity)  Result Value Ref Range   Troponin I (High Sensitivity) 7 <18 ng/L   *Note: Due to a large number of results and/or encounters for the requested time period, some results have not been displayed. A complete set of results can be found in Results Review.   Assessment & Plan:   Problem List Items Addressed This Visit       Digestive   Nausea and vomiting   Relevant Medications   ondansetron (ZOFRAN) 8 MG tablet     Musculoskeletal and Integument   Rash and nonspecific skin eruption consistent with small vessel vasculitis, Ddx includes embolic cause    Relevant Medications   fluconazole (DIFLUCAN) 150 MG tablet     Genitourinary   Vaginal itching - Primary    Although exam is difficult given the virtual platform, symptoms do seem to represent vaginitis.  Fluconazole 150 mg tablet sent to pharmacy.  Start 1 tablet today, repeat in 3 days.  I have asked that she follow-up in the office if no improvement with symptoms.        Meds ordered this encounter  Medications   ondansetron (ZOFRAN) 8 MG tablet    Sig: One pill every 8 hours as needed for nausea/vomitting.    Dispense:  40 tablet    Refill:  0   fluconazole (DIFLUCAN) 150 MG tablet    Sig: Take 1  tablet by mouth today, then take the second tablet by mouth in 3 days for vaginal itching.    Dispense:  2 tablet    Refill:  0    Order Specific Question:   Supervising Provider    Answer:   BEDSOLE, AMY E [[id number]]   No orders of the defined types were placed in this encounter.   I discussed the assessment and treatment plan with the patient. The patient was provided an opportunity to ask questions and all were answered. The patient agreed with the plan and demonstrated an understanding of the instructions. The patient was advised to call back or seek an in-person evaluation if the symptoms worsen or if the condition fails to improve as anticipated.  Follow up plan:  Take the fluconazole yeast pill today and take the second fluconazole use pill in 3 days.  Please come see me if your symptoms persist.  It was a pleasure to see you today!   Pleas Koch, NP

## 2022-05-06 NOTE — Telephone Encounter (Signed)
Husband, Kennith Center came in office for visit and informed him that Anda Kraft has offered a 3:40 virtual appointment for patient considering it is hard for her to get to the office. He stated he would inform patient of the appointment when he gets home

## 2022-05-09 ENCOUNTER — Telehealth: Payer: Self-pay | Admitting: Primary Care

## 2022-05-09 ENCOUNTER — Encounter (INDEPENDENT_AMBULATORY_CARE_PROVIDER_SITE_OTHER): Payer: Self-pay

## 2022-05-09 DIAGNOSIS — E1151 Type 2 diabetes mellitus with diabetic peripheral angiopathy without gangrene: Secondary | ICD-10-CM | POA: Diagnosis not present

## 2022-05-09 DIAGNOSIS — I872 Venous insufficiency (chronic) (peripheral): Secondary | ICD-10-CM | POA: Diagnosis not present

## 2022-05-09 DIAGNOSIS — E1169 Type 2 diabetes mellitus with other specified complication: Secondary | ICD-10-CM | POA: Diagnosis not present

## 2022-05-09 DIAGNOSIS — F319 Bipolar disorder, unspecified: Secondary | ICD-10-CM | POA: Diagnosis not present

## 2022-05-09 DIAGNOSIS — E782 Mixed hyperlipidemia: Secondary | ICD-10-CM | POA: Diagnosis not present

## 2022-05-09 DIAGNOSIS — E1122 Type 2 diabetes mellitus with diabetic chronic kidney disease: Secondary | ICD-10-CM | POA: Diagnosis not present

## 2022-05-09 DIAGNOSIS — G9341 Metabolic encephalopathy: Secondary | ICD-10-CM | POA: Diagnosis not present

## 2022-05-09 DIAGNOSIS — N1831 Chronic kidney disease, stage 3a: Secondary | ICD-10-CM | POA: Diagnosis not present

## 2022-05-09 DIAGNOSIS — I5032 Chronic diastolic (congestive) heart failure: Secondary | ICD-10-CM | POA: Diagnosis not present

## 2022-05-09 DIAGNOSIS — D631 Anemia in chronic kidney disease: Secondary | ICD-10-CM | POA: Diagnosis not present

## 2022-05-09 DIAGNOSIS — F419 Anxiety disorder, unspecified: Secondary | ICD-10-CM | POA: Diagnosis not present

## 2022-05-09 DIAGNOSIS — I130 Hypertensive heart and chronic kidney disease with heart failure and stage 1 through stage 4 chronic kidney disease, or unspecified chronic kidney disease: Secondary | ICD-10-CM | POA: Diagnosis not present

## 2022-05-09 NOTE — Telephone Encounter (Signed)
Home Health verbal orders Caller Name:[facility]   Agency Name: [facility] number: [phone]  Requesting OT/PT/Skilled nursing/Social Work/Speech:skilled nursing  Reason:  Frequency:1 week for 4 weeks and two PRN  Please forward to [facility] pool or providers CMA

## 2022-05-09 NOTE — Telephone Encounter (Signed)
Called and notified Lora with [facility] of approval of verbal orders. Will call with any further questions

## 2022-05-09 NOTE — Telephone Encounter (Signed)
Approved.  

## 2022-05-11 ENCOUNTER — Inpatient Hospital Stay (HOSPITAL_BASED_OUTPATIENT_CLINIC_OR_DEPARTMENT_OTHER): Payer: BC Managed Care – PPO | Admitting: Internal Medicine

## 2022-05-11 ENCOUNTER — Encounter: Payer: Self-pay | Admitting: Internal Medicine

## 2022-05-11 ENCOUNTER — Other Ambulatory Visit: Payer: Self-pay

## 2022-05-11 ENCOUNTER — Telehealth: Payer: Self-pay | Admitting: Primary Care

## 2022-05-11 ENCOUNTER — Inpatient Hospital Stay: Payer: BC Managed Care – PPO

## 2022-05-11 ENCOUNTER — Inpatient Hospital Stay: Payer: BC Managed Care – PPO | Attending: Internal Medicine

## 2022-05-11 ENCOUNTER — Inpatient Hospital Stay (HOSPITAL_BASED_OUTPATIENT_CLINIC_OR_DEPARTMENT_OTHER): Payer: BC Managed Care – PPO | Admitting: Hospice and Palliative Medicine

## 2022-05-11 DIAGNOSIS — Z79899 Other long term (current) drug therapy: Secondary | ICD-10-CM | POA: Diagnosis not present

## 2022-05-11 DIAGNOSIS — Z170 Estrogen receptor positive status [ER+]: Secondary | ICD-10-CM | POA: Insufficient documentation

## 2022-05-11 DIAGNOSIS — F17210 Nicotine dependence, cigarettes, uncomplicated: Secondary | ICD-10-CM | POA: Diagnosis not present

## 2022-05-11 DIAGNOSIS — C50511 Malignant neoplasm of lower-outer quadrant of right female breast: Secondary | ICD-10-CM

## 2022-05-11 DIAGNOSIS — E1142 Type 2 diabetes mellitus with diabetic polyneuropathy: Secondary | ICD-10-CM | POA: Insufficient documentation

## 2022-05-11 DIAGNOSIS — Z79811 Long term (current) use of aromatase inhibitors: Secondary | ICD-10-CM | POA: Insufficient documentation

## 2022-05-11 DIAGNOSIS — J9610 Chronic respiratory failure, unspecified whether with hypoxia or hypercapnia: Secondary | ICD-10-CM | POA: Insufficient documentation

## 2022-05-11 DIAGNOSIS — F419 Anxiety disorder, unspecified: Secondary | ICD-10-CM | POA: Insufficient documentation

## 2022-05-11 DIAGNOSIS — Z794 Long term (current) use of insulin: Secondary | ICD-10-CM | POA: Diagnosis not present

## 2022-05-11 LAB — CBC WITH DIFFERENTIAL/PLATELET
Abs Immature Granulocytes: 0.05 10*3/uL (ref 0.00–0.07)
Basophils Absolute: 0.1 10*3/uL (ref 0.0–0.1)
Basophils Relative: 1 %
Eosinophils Absolute: 0.3 10*3/uL (ref 0.0–0.5)
Eosinophils Relative: 3 %
HCT: 41.5 % (ref 36.0–46.0)
Hemoglobin: 13.4 g/dL (ref 12.0–15.0)
Immature Granulocytes: 1 %
Lymphocytes Relative: 15 %
Lymphs Abs: 1.3 10*3/uL (ref 0.7–4.0)
MCH: 26.3 pg (ref 26.0–34.0)
MCHC: 32.3 g/dL (ref 30.0–36.0)
MCV: 81.5 fL (ref 80.0–100.0)
Monocytes Absolute: 0.5 10*3/uL (ref 0.1–1.0)
Monocytes Relative: 6 %
Neutro Abs: 6.3 10*3/uL (ref 1.7–7.7)
Neutrophils Relative %: 74 %
Platelets: 228 10*3/uL (ref 150–400)
RBC: 5.09 MIL/uL (ref 3.87–5.11)
RDW: 19.8 % — ABNORMAL HIGH (ref 11.5–15.5)
WBC: 8.6 10*3/uL (ref 4.0–10.5)
nRBC: 0 % (ref 0.0–0.2)

## 2022-05-11 LAB — COMPREHENSIVE METABOLIC PANEL
ALT: 16 U/L (ref 0–44)
AST: 16 U/L (ref 15–41)
Albumin: 3.2 g/dL — ABNORMAL LOW (ref 3.5–5.0)
Alkaline Phosphatase: 143 U/L — ABNORMAL HIGH (ref 38–126)
Anion gap: 5 (ref 5–15)
BUN: 30 mg/dL — ABNORMAL HIGH (ref 6–20)
CO2: 24 mmol/L (ref 22–32)
Calcium: 11.1 mg/dL — ABNORMAL HIGH (ref 8.9–10.3)
Chloride: 106 mmol/L (ref 98–111)
Creatinine, Ser: 0.79 mg/dL (ref 0.44–1.00)
GFR, Estimated: >60 mL/min (ref >60)
Glucose, Bld: 315 mg/dL — ABNORMAL HIGH (ref 70–99)
Potassium: 4.2 mmol/L (ref 3.5–5.1)
Sodium: 135 mmol/L (ref 135–145)
Total Bilirubin: 0.4 mg/dL (ref 0.3–1.2)
Total Protein: 8.2 g/dL — ABNORMAL HIGH (ref 6.5–8.1)

## 2022-05-11 MED ORDER — LEUPROLIDE ACETATE (3 MONTH) 11.25 MG IM KIT
11.2500 mg | PACK | Freq: Once | INTRAMUSCULAR | Status: AC
  Administered 2022-05-11: 11.25 mg via INTRAMUSCULAR
  Filled 2022-05-11: qty 11.25

## 2022-05-11 NOTE — Progress Notes (Signed)
I connected with [name] on 05/11/22 at 10:00 AM EDT by video enabled telemedicine visit and verified that I am speaking with the correct person using two identifiers.  I discussed the limitations, risks, security and privacy concerns of performing an evaluation and management service by telemedicine and the availability of in-person appointments. I also discussed with the patient that there may be a patient responsible charge related to this service. The patient expressed understanding and agreed to proceed.    Other persons participating in the visit and their role in the encounter: RN/medical reconciliation Patient's location: office  Provider's location: home  Oncology History Overview Note  #April 2021-2.5 cm right breast mass [incidental-CT scan chest]  # April 2021- RIGHT BREAST [facility]; s/p ER- 51-90%; PR- 51-90%; Her- 2-NEG. G-2. [Dr.Rodenberg]; s/p  Masectomy- pT2 pN0 (sn); Tamoxifen; Poor candidate for chemo;   # Discontinue tamoxifen early DEC 2021 [sec to nausea/vomiting diarrhea]  #Late December 2021- Anastrazole + Lupron 11.25 every 3 months  # DM- poorly controlled; Seizure disorder/Bipolar/TIA ; Diabetes PN- G-3 [cane/walker; falls]; COPD; OSA active smoker  # SURVIVORSHIP:   # GENETICS:   DIAGNOSIS: Right breast cancer  STAGE:    II     ;  GOALS: goal  CURRENT/MOST RECENT THERAPY : Anastrazole + Lupron 11.25 every 3 months     Carcinoma of lower-outer quadrant of right breast in female, estrogen receptor positive ([place])  11/12/2019 Initial Diagnosis   Carcinoma of lower-outer quadrant of right breast in female, estrogen receptor positive ([place])      Chief Complaint: breast cancer  History of present illness:[name] 51 y.o.  female premenopausal patient with right-sided stage II ER/PR positive HER-2 negative breast cancer on adjuvant anastrozole +Lupron with multiple comorbidities-including poorly controlled diabetes/bipolar disorder/active  smoker/neuropathy is here for follow-up.  In the interim patient had a leg amputation.  Had a recent evaluation with vascular surgery.  Complains of pain from surgery.   Patient continues to have elevated blood glucose;-evaluated by endocrinology.   Otherwise no fever no chills.  No nausea no vomiting.     Observation/objective: Alert & oriented x 3. In No acute distress.   Assessment and plan: Carcinoma of lower-outer quadrant of right breast in female, estrogen receptor positive ([place]) # T2N0- ER/PR-positive; premenopausal currently on Anastrazole [stopped tam sec to poor tolerance].  Continue  EligardDepo 11.25 mg every 3 months. AP CT SEP  2023-  Fibrotic changes, small right pleural effusion and a stable 6 mm nodule in the right lower lobe. Non-contrast chest CT at 6-12 months is recommended.  Will order at next visit.  # Continue anastrozole + Eligard for now.  Tolerating fairly well.  Eligard- today  #Left lower extremity s/p BKA-pain-patient interested in medical marijuana.  I would for to PCP/pain clinic referral if needed.  Discussed with [employer].  # Hypercalcemia: 11; unclear etiology.  Low suspicion for malignancy.  Check PTH; if normal would recommend bone scan for further evaluation.  # Headache/migraines-awaiting neurology evaluation do not suspect for malignancy; [DW PCP]- STABLE.   #Depression/anxiety/bipolar-currently on BuSpar/Celexa/trazodone As per psychiatry- STABLE.  #Chronic respiratory failure-CHF/COPD; STABLE. SEP 2022-CT scan [bronchiolitis from smoking]; discussed at length regarding quitting smoking/ states cutting down. STABLE.  #Poorly controlled diabetes-on insulin; FBG AM- 317- [Dr.O'Connell endocrinology]-  Again stressed the importance of glucose control; especially with the neuropathy/lower extremity ulcers.  # Peripheral neuropathy grade 2-3; patient goes on with a walker/cane at baseline- STABLE.   DISPOSITION:  # lab today again- ordered  PTH # lupron today # follow up in 3 month- MD; labs- cbc/cmp/ca-27-29; Lupron- Dr.B   Follow-up instructions:  I discussed the assessment and treatment plan with the patient.  The patient was provided an opportunity to ask questions and all were answered.  The patient agreed with the plan and demonstrated understanding of instructions.  The patient was advised to call back or seek an in person evaluation if the symptoms worsen or if the condition fails to improve as anticipated.  Dr. Charlaine Dalton [place] at [facility] 05/11/2022 11:32 AM
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
## 2022-05-11 NOTE — Assessment & Plan Note (Addendum)
#   T2N0- ER/PR-positive; premenopausal currently on Anastrazole [stopped tam sec to poor tolerance].  Continue  EligardDepo 11.25 mg every 3 months. AP CT SEP  2023-  Fibrotic changes, small right pleural effusion and a stable 6 mm nodule in the right lower lobe. Non-contrast chest CT at 6-12 months is recommended.  Will order at next visit.  # Continue anastrozole + Eligard for now.  Tolerating fairly well.  Eligard- today.  Discussed the treatments will be recommended for anywhere between 5 to 10 years.  #Left lower extremity s/p BKA-pain-patient interested in medical marijuana.  I would for to PCP/pain clinic referral if needed.  Discussed with [employer].  # Hypercalcemia: 11; unclear etiology.  Low suspicion for malignancy.  Check PTH; if normal would recommend bone scan for further evaluation.  # Headache/migraines-awaiting neurology evaluation do not suspect for malignancy; [DW PCP]- STABLE.   #Depression/anxiety/bipolar-currently on BuSpar/Celexa/trazodone As per psychiatry- STABLE.  #Chronic respiratory failure-CHF/COPD; STABLE. SEP 2022-CT scan [bronchiolitis from smoking]; discussed at length regarding quitting smoking/ states cutting down. STABLE.  #Poorly controlled diabetes-on insulin; FBG AM- 317- [Dr.O'Connell endocrinology]-  Again stressed the importance of glucose control; especially with the neuropathy/lower extremity ulcers.  # Peripheral neuropathy grade 2-3; patient goes on with a walker/cane at baseline- STABLE.   DISPOSITION:  # lab today again- ordered PTH # lupron today # follow up in 3 month- MD; labs- cbc/cmp/ca-27-29; Lupron- Dr.B

## 2022-05-11 NOTE — Telephone Encounter (Signed)
Left message for patient to call back and schedule Medicare Annual Wellness Visit (AWV).  [place] offer to do virtually or by telephone.   No hx of AWV - eligible as of 01/09/11  Please schedule at anytime with [facility].   45 minute appointment  Any questions, please contact me at [phone]

## 2022-05-12 LAB — CANCER ANTIGEN 27.29: CA 27.29: 75.5 U/mL — ABNORMAL HIGH (ref 0.0–38.6)

## 2022-05-12 LAB — PARATHYROID HORMONE, INTACT (NO CA): PTH: 9 pg/mL — ABNORMAL LOW (ref 15–65)

## 2022-05-13 ENCOUNTER — Other Ambulatory Visit: Payer: Self-pay | Admitting: Internal Medicine

## 2022-05-13 DIAGNOSIS — Z170 Estrogen receptor positive status [ER+]: Secondary | ICD-10-CM

## 2022-05-13 DIAGNOSIS — C50511 Malignant neoplasm of lower-outer quadrant of right female breast: Secondary | ICD-10-CM

## 2022-05-13 NOTE — Progress Notes (Signed)
I spoke to patient regarding work-up for elevated calcium.  Given elevated cancer marker/low PTH-concern for malignancy related hypercalcemia.  Recommend bone scan and CT of the chest ASAP.  CT of the abdomen pelvis with contrast in September/about 6 weeks ago was unremarkable for malignancy.  Patient will follow-up with provider-2 days after the imaging is done; MD labs CBC CMP; possible Zometa infusion.  Thanks GB

## 2022-05-13 NOTE — Addendum Note (Signed)
Addended by: Vanice Sarah on: 05/13/2022 02:03 PM   Modules accepted: Orders

## 2022-05-13 NOTE — Progress Notes (Signed)
Lab orders entered.  MD f/u pending CT appt.

## 2022-05-17 ENCOUNTER — Ambulatory Visit (INDEPENDENT_AMBULATORY_CARE_PROVIDER_SITE_OTHER): Payer: BC Managed Care – PPO

## 2022-05-17 ENCOUNTER — Other Ambulatory Visit: Payer: Medicare Other | Admitting: Internal Medicine

## 2022-05-17 VITALS — Wt 220.0 lb

## 2022-05-17 VITALS — BP 112/58 | HR 73 | Temp 96.8°F | Resp 18

## 2022-05-17 DIAGNOSIS — C50511 Malignant neoplasm of lower-outer quadrant of right female breast: Secondary | ICD-10-CM

## 2022-05-17 DIAGNOSIS — Z0000 Encounter for general adult medical examination without abnormal findings: Secondary | ICD-10-CM | POA: Diagnosis not present

## 2022-05-17 DIAGNOSIS — S88111S Complete traumatic amputation at level between knee and ankle, right lower leg, sequela: Secondary | ICD-10-CM

## 2022-05-17 DIAGNOSIS — Z170 Estrogen receptor positive status [ER+]: Secondary | ICD-10-CM

## 2022-05-17 DIAGNOSIS — S81802A Unspecified open wound, left lower leg, initial encounter: Secondary | ICD-10-CM

## 2022-05-17 DIAGNOSIS — G894 Chronic pain syndrome: Secondary | ICD-10-CM

## 2022-05-17 DIAGNOSIS — Z741 Need for assistance with personal care: Secondary | ICD-10-CM

## 2022-05-17 DIAGNOSIS — S88112S Complete traumatic amputation at level between knee and ankle, left lower leg, sequela: Secondary | ICD-10-CM

## 2022-05-17 DIAGNOSIS — Z515 Encounter for palliative care: Secondary | ICD-10-CM
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
## 2022-05-17 NOTE — Patient Instructions (Signed)
[name] , Thank you for taking time to come for your Medicare Wellness Visit. I appreciate your ongoing commitment to your health goals. Please review the following plan we discussed and let me know if I can assist you in the future.   Screening recommendations/referrals: Colonoscopy: deferred for now Mammogram: ordered 02/10/22 Recommended yearly ophthalmology/optometry visit for glaucoma screening and checkup Recommended yearly dental visit for hygiene and checkup  Vaccinations: Influenza vaccine: 03/05/21 Pneumococcal vaccine: 05/11/16 Tdap vaccine: 06/15/11 Shingles vaccine: Shingrix 09/01/20, 03/05/21  Covid-19: 05/24/20, 06/21/20  Advanced directives: yes  Conditions/risks identified: none  Next appointment: Follow up in one year for your annual wellness visit. 05/19/23 @ 9:15 am by phone  Preventive Care 40-64 Years, Female Preventive care refers to lifestyle choices and visits with your health care provider that can promote health and wellness. What does preventive care include? A yearly physical exam. This is also called an annual well check. Dental exams once or twice a year. Routine eye exams. Ask your health care provider how often you should have your eyes checked. Personal lifestyle choices, including: Daily care of your teeth and gums. Regular physical activity. Eating a healthy diet. Avoiding tobacco and drug use. Limiting alcohol use. Practicing safe sex. Taking low-dose aspirin daily starting at age 51. Taking vitamin and mineral supplements as recommended by your health care provider. What happens during an annual well check? The services and screenings done by your health care provider during your annual well check will depend on your age, overall health, lifestyle risk factors, and family history of disease. Counseling  Your health care provider may ask you questions about your: Alcohol use. Tobacco use. Drug use. Emotional well-being. Home and  relationship well-being. Sexual activity. Eating habits. Work and work Statistician. Method of birth control. Menstrual cycle. Pregnancy history. Screening  You may have the following tests or measurements: Height, weight, and BMI. Blood pressure. Lipid and cholesterol levels. These may be checked every 5 years, or more frequently if you are over 23 years old. Skin check. Lung cancer screening. You may have this screening every year starting at age 15 if you have a 30-pack-year history of smoking and currently smoke or have quit within the past 15 years. Fecal occult blood test (FOBT) of the stool. You may have this test every year starting at age 5. Flexible sigmoidoscopy or colonoscopy. You may have a sigmoidoscopy every 5 years or a colonoscopy every 10 years starting at age 86. Hepatitis C blood test. Hepatitis B blood test. Sexually transmitted disease (STD) testing. Diabetes screening. This is done by checking your blood sugar (glucose) after you have not eaten for a while (fasting). You may have this done every 1-3 years. Mammogram. This may be done every 1-2 years. Talk to your health care provider about when you should start having regular mammograms. This may depend on whether you have a family history of breast cancer. BRCA-related cancer screening. This may be done if you have a family history of breast, ovarian, tubal, or peritoneal cancers. Pelvic exam and Pap test. This may be done every 3 years starting at age 83. Starting at age 60, this may be done every 5 years if you have a Pap test in combination with an HPV test. Bone density scan. This is done to screen for osteoporosis. You may have this scan if you are at high risk for osteoporosis. Discuss your test results, treatment options, and if necessary, the need for more tests with your health care provider.  Vaccines  Your health care provider may recommend certain vaccines, such as: Influenza vaccine. This is recommended  every year. Tetanus, diphtheria, and acellular pertussis (Tdap, Td) vaccine. You may need a Td booster every 10 years. Zoster vaccine. You may need this after age 60. Pneumococcal 13-valent conjugate (PCV13) vaccine. You may need this if you have certain conditions and were not previously vaccinated. Pneumococcal polysaccharide (PPSV23) vaccine. You may need one or two doses if you smoke cigarettes or if you have certain conditions. Talk to your health care provider about which screenings and vaccines you need and how often you need them. This information is not intended to replace advice given to you by your health care provider. Make sure you discuss any questions you have with your health care provider. Document Released: 07/24/2015 Document Revised: 03/16/2016 Document Reviewed: 04/28/2015 Elsevier Interactive Patient Education  2017 Else[employer] Inc[employer]    Fall Prevention in the Home Falls can cause injuries. They can happen to people of all ages. There are many things you can do to make your home safe and to help prevent falls. What can I do on the outside of my home? Regularly fix the edges of walkways and driveways and fix any cracks. Remove anything that might make you trip as you walk through a door, such as a raised step or threshold. Trim any bushes or trees on the path to your home. Use bright outdoor lighting. Clear any walking paths of anything that might make someone trip, such as rocks or tools. Regularly check to see if handrails are loose or broken. Make sure that both sides of any steps have handrails. Any raised decks and porches should have guardrails on the edges. Have any leaves, snow, or ice cleared regularly. Use sand or salt on walking paths during winter. Clean up any spills in your garage right away. This includes oil or grease spills. What can I do in the bathroom? Use night lights. Install grab bars by the toilet and in the tub and shower. Do not use towel bars as  grab bars. Use non-skid mats or decals in the tub or shower. If you need to sit down in the shower, use a plastic, non-slip stool. Keep the floor dry. Clean up any water that spills on the floor as soon as it happens. Remove soap buildup in the tub or shower regularly. Attach bath mats securely with double-sided non-slip rug tape. Do not have throw rugs and other things on the floor that can make you trip. What can I do in the bedroom? Use night lights. Make sure that you have a light by your bed that is easy to reach. Do not use any sheets or blankets that are too big for your bed. They should not hang down onto the floor. Have a firm chair that has side arms. You can use this for support while you get dressed. Do not have throw rugs and other things on the floor that can make you trip. What can I do in the kitchen? Clean up any spills right away. Avoid walking on wet floors. Keep items that you use a lot in easy-to-reach places. If you need to reach something above you, use a strong step stool that has a grab bar. Keep electrical cords out of the way. Do not use floor polish or wax that makes floors slippery. If you must use wax, use non-skid floor wax. Do not have throw rugs and other things on the floor that can make you   trip. What can I do with my stairs? Do not leave any items on the stairs. Make sure that there are handrails on both sides of the stairs and use them. Fix handrails that are broken or loose. Make sure that handrails are as long as the stairways. Check any carpeting to make sure that it is firmly attached to the stairs. Fix any carpet that is loose or worn. Avoid having throw rugs at the top or bottom of the stairs. If you do have throw rugs, attach them to the floor with carpet tape. Make sure that you have a light switch at the top of the stairs and the bottom of the stairs. If you do not have them, ask someone to add them for you. What else can I do to help prevent  falls? Wear shoes that: Do not have high heels. Have rubber bottoms. Are comfortable and fit you well. Are closed at the toe. Do not wear sandals. If you use a stepladder: Make sure that it is fully opened. Do not climb a closed stepladder. Make sure that both sides of the stepladder are locked into place. Ask someone to hold it for you, if possible. Clearly mark and make sure that you can see: Any grab bars or handrails. First and last steps. Where the edge of each step is. Use tools that help you move around (mobility aids) if they are needed. These include: Canes. Walkers. Scooters. Crutches. Turn on the lights when you go into a dark area. Replace any light bulbs as soon as they burn out. Set up your furniture so you have a clear path. Avoid moving your furniture around. If any of your floors are uneven, fix them. If there are any pets around you, be aware of where they are. Review your medicines with your doctor. Some medicines can make you feel dizzy. This can increase your chance of falling. Ask your doctor what other things that you can do to help prevent falls. This information is not intended to replace advice given to you by your health care provider. Make sure you discuss any questions you have with your health care provider. Document Released: 04/23/2009 Document Revised: 12/03/2015 Document Reviewed: 08/01/2014 Elsevier Interactive Patient Education  2017 [employer].

## 2022-05-19 ENCOUNTER — Encounter: Admission: RE | Admit: 2022-05-19 | Payer: BC Managed Care – PPO | Source: Ambulatory Visit

## 2022-05-19 ENCOUNTER — Telehealth: Payer: Self-pay | Admitting: *Deleted

## 2022-05-19 NOTE — Telephone Encounter (Signed)
Patient was a No Show for her Bone Scan today

## 2022-05-20 ENCOUNTER — Encounter: Payer: Self-pay | Admitting: Internal Medicine

## 2022-05-22 ENCOUNTER — Encounter: Payer: Self-pay | Admitting: Internal Medicine

## 2022-05-25 ENCOUNTER — Ambulatory Visit
Admission: RE | Admit: 2022-05-25 | Discharge: 2022-05-25 | Disposition: A | Discharge status: Home / Self Care | Payer: BC Managed Care – PPO | Source: Ambulatory Visit | Attending: Internal Medicine | Admitting: Internal Medicine

## 2022-05-25 DIAGNOSIS — R918 Other nonspecific abnormal finding of lung field: Secondary | ICD-10-CM | POA: Diagnosis not present

## 2022-05-25 DIAGNOSIS — C50511 Malignant neoplasm of lower-outer quadrant of right female breast: Secondary | ICD-10-CM | POA: Diagnosis not present

## 2022-05-25 DIAGNOSIS — Z170 Estrogen receptor positive status [ER+]: Secondary | ICD-10-CM | POA: Diagnosis not present

## 2022-05-25 DIAGNOSIS — C50911 Malignant neoplasm of unspecified site of right female breast: Secondary | ICD-10-CM | POA: Diagnosis not present

## 2022-05-25 DIAGNOSIS — J439 Emphysema, unspecified: Secondary | ICD-10-CM | POA: Diagnosis not present

## 2022-05-26 ENCOUNTER — Encounter
Admission: RE | Admit: 2022-05-26 | Discharge: 2022-05-26 | Disposition: A | Discharge status: Home / Self Care | Payer: BC Managed Care – PPO | Source: Ambulatory Visit | Attending: Internal Medicine | Admitting: Internal Medicine

## 2022-05-26 ENCOUNTER — Telehealth: Payer: Self-pay

## 2022-05-26 DIAGNOSIS — C50511 Malignant neoplasm of lower-outer quadrant of right female breast: Secondary | ICD-10-CM | POA: Diagnosis not present

## 2022-05-26 DIAGNOSIS — Z170 Estrogen receptor positive status [ER+]: Secondary | ICD-10-CM | POA: Diagnosis not present

## 2022-05-26 DIAGNOSIS — C50919 Malignant neoplasm of unspecified site of unspecified female breast: Secondary | ICD-10-CM | POA: Diagnosis not present

## 2022-05-26 MED ORDER — TECHNETIUM TC 99M MEDRONATE IV KIT
22.1000 | PACK | Freq: Once | INTRAVENOUS | Status: AC | PRN
  Administered 2022-05-26: 22.1 via INTRAVENOUS

## 2022-05-26 NOTE — Telephone Encounter (Signed)
250 pm.  Message received from patient this am.  Return call but no answer.  Message left requesting a call back.

## 2022-05-27 ENCOUNTER — Telehealth: Payer: Self-pay | Admitting: Internal Medicine

## 2022-05-27 ENCOUNTER — Telehealth: Payer: Self-pay | Admitting: *Deleted

## 2022-05-27 ENCOUNTER — Telehealth: Payer: Self-pay | Admitting: Primary Care

## 2022-05-27 NOTE — Telephone Encounter (Signed)
Patient called asking for return call to discuss her lab results. Next appointment is not until February  CBC with Differential Order: [id number] Status: Final result     Visible to patient: Yes (not seen)     Next appt: 06/08/2022 at 09:20 AM in Family Medicine (Pleas Koch, NP)     Dx: Carcinoma of lower-outer quadrant of ...   0 Result Notes           Component Ref Range & Units 2 wk ago (05/11/22) 1 mo ago (04/02/22) 1 mo ago (04/01/22) 1 mo ago (03/31/22) 1 mo ago (03/29/22) 3 mo ago (02/09/22) 4 mo ago (01/13/22)  WBC 4.0 - 10.5 K/uL 8.6 8.3 5.8 6.1 7.3 5.3 5.3  RBC 3.87 - 5.11 MIL/uL 5.09 5.29 High  4.83 5.02 5.09 4.23 3.94  Hemoglobin 12.0 - 15.0 g/dL 13.4 12.9 11.6 Low  12.1 12.4 10.1 Low  CM 9.3 Low   HCT 36.0 - 46.0 % 41.5 39.1 35.9 Low  37.3 39.8 33.1 Low  30.7 Low   MCV 80.0 - 100.0 fL 81.5 73.9 Low  74.3 Low  74.3 Low  78.2 Low  78.3 Low  77.9 Low   MCH 26.0 - 34.0 pg 26.3 24.4 Low  24.0 Low  24.1 Low  24.4 Low  23.9 Low  23.6 Low   MCHC 30.0 - 36.0 g/dL 32.3 33.0 32.3 32.4 31.2 30.5 30.3  RDW 11.5 - 15.5 % 19.8 High  18.3 High  18.4 High  18.6 High  19.1 High  18.1 High  17.1 High   Platelets 150 - 400 K/uL 228 182 178 187 161 167 183  nRBC 0.0 - 0.2 % 0.0 0.0 CM 0.0 CM 0.0 CM 0.0 0.0 0.0  Neutrophils Relative % % 74    72 74 71  Neutro Abs 1.7 - 7.7 K/uL 6.3    5.3 3.9 3.8  Lymphocytes Relative % '15    16 14 16  '$ Lymphs Abs 0.7 - 4.0 K/uL 1.3    1.2 0.8 0.9  Monocytes Relative % '6    7 6 6  '$ Monocytes Absolute 0.1 - 1.0 K/uL 0.5    0.5 0.3 0.3  Eosinophils Relative % '3    4 5 5  '$ Eosinophils Absolute 0.0 - 0.5 K/uL 0.3    0.3 0.3 0.2  Basophils Relative % '1    1 1 1  '$ Basophils Absolute 0.0 - 0.1 K/uL 0.1    0.1 0.1 0.1  Immature Granulocytes % 1    0 0 1  Abs Immature Granulocytes 0.00 - 0.07 K/uL 0.05    0.03 CM 0.01 CM 0.03 CM  Comment: Performed at [facility], [address] [place], [place] [address]  Resulting Agency  [place] [facility] [place] [facility] [place]  [facility] [place] [facility] [place] [facility] [place] [facility] [place] [facility]         Specimen Collected: 05/11/22 09:43 Last Resulted: 05/11/22 10:03     View All Conversations on this Encounter      CM=Additional comments      Result Care Coordination   Patient Communication   Add Comments   Add Notifications  Back to Top      Other Results from 05/11/2022   Contains abnormal data Parathyroid hormone, intact (no Ca) Order: [id number] Status: Final result      Visible to patient: Yes (seen)      Next appt: 06/08/2022 at 09:20 AM in Family Medicine Belenda Cruise K  Carlis Abbott, NP)      Dx: Hypercalcemia; Carcinoma of lower-out...    0 Result Notes        Component Ref Range & Units 2 wk ago 1 mo ago  PTH 15 - 65 pg/mL 9 Low  7 Low  CM  Comment: (NOTE) Performed At: [facility] [address] [place], [place] [id number] Rush Farmer MD BW:[id number]  Resulting Agency  [facility]         Specimen Collected: 05/11/22 11:32 Last Resulted: 05/12/22 11:36     View All Conversations on this Encounter      CM=Additional comments      Result Care Coordination   Patient Communication   Add Comments   Seen Back to Top         Contains abnormal data Cancer antigen 27.29 Order: [id number] Status: Final result      Visible to patient: Yes (seen)      Next appt: 06/08/2022 at 09:20 AM in Family Medicine (Pleas Koch, NP)      Dx: Carcinoma of lower-outer quadrant of ...    0 Result Notes             Component Ref Range & Units 2 wk ago (05/11/22) 3 mo ago (02/09/22) 8 mo ago (09/10/21) 1 yr ago (05/12/21) 1 yr ago (04/14/21) 1 yr ago (03/10/21) 1 yr ago (02/03/21)  CA 27.29 0.0 - 38.6 U/mL 75.5 High  35.5 CM 39.7 High  CM 31.4 CM 23.0 CM 35.3 CM 26.9 CM  Comment: (NOTE) Siemens Centaur Immunochemiluminometric Methodology [facility]) Values obtained with different assay methods or kits cannot be used interchangeably. Results cannot be interpreted as  absolute evidence of the presence or absence of malignant disease. Performed At: [facility] [address] [place], [place] [id number] Rush Farmer MD AU:[id number]  Resulting Agency  [place] [facility] [place] [facility] [place] [facility] [place] [facility] [place] [facility] [place] [facility]         Specimen Collected: 05/11/22 09:43 Last Resulted: 05/12/22 05:37     View All Conversations on this Encounter      CM=Additional comments      Result Care Coordination   Patient Communication   Add Comments   Seen Back to Top         Contains abnormal data Comprehensive metabolic panel Order: [id number] Status: Final result      Visible to patient: Yes (seen)      Next appt: 06/08/2022 at 09:20 AM in Family Medicine (Pleas Koch, NP)      Dx: Carcinoma of lower-outer quadrant of ...    0 Result Notes      1 HM Topic             Component Ref Range & Units 2 wk ago (05/11/22) 1 mo ago (04/02/22) 1 mo ago (04/02/22) 1 mo ago (04/01/22) 1 mo ago (04/01/22) 1 mo ago (03/31/22) 1 mo ago (03/30/22)  Sodium 135 - 145 mmol/L 135 140 140 140 140 138 139 CM  Potassium 3.5 - 5.1 mmol/L 4.2 3.5 3.1 Low  3.6 3.3 Low  3.3 Low  3.5  Chloride 98 - 111 mmol/L 106 114 High  113 High  114 High  111 112 High  111  CO2 22 - 32 mmol/L 24 20 Low  20 Low  '23 22 22 24  '$ Glucose, Bld 70 - 99 mg/dL 315 High  130 High  CM  150 High  CM 107 High  CM 136 High  CM 173 High  CM 186 High  CM  Comment: Glucose reference range applies only to samples taken after fasting for at least 8 hours.  BUN 6 - 20 mg/dL 30 High  '11 12 14 16 14 18  '$ Creatinine, Ser 0.44 - 1.00 mg/dL 0.79 0.49 0.52 0.60 0.63 0.58 0.68  Calcium 8.9 - 10.3 mg/dL 11.1 High  10.9 High  11.3 High  11.6 High  11.8 High  11.3 High  11.4 High   Total Protein 6.5 - 8.1 g/dL 8.2 High    7.8     Albumin 3.5 - 5.0 g/dL 3.2 Low    2.7 Low      AST 15 - 41 U/L 16   18     ALT 0 - 44 U/L 16   13     Alkaline Phosphatase 38 - 126 U/L 143 High    114      Total Bilirubin 0.3 - 1.2 mg/dL 0.4   0.7     GFR, Estimated >60 mL/min >60 >60 CM >60 CM >60 CM >60 CM >60 CM >60 CM  Comment: (NOTE) Calculated using the CKD-EPI Creatinine Equation (2021)  Anion gap 5 - '15 5 6 '$ CM 7 CM 3 Low  CM 7 CM 4 Low  CM 4 Low  CM  Comment: Performed at [facility], [address] [place], [place] [address]  Resulting Agency  [place] [facility] [place] [facility] [place] [facility] [place] [facility] [place] [facility] [place] [facility] [place] [facility]         Specimen Collected: 05/11/22 09:43 Last Resulted: 05/11/22 10:17     View All Conversations on this Encounter      CM=Additional comments      Result Care Coordination   Patient Communication   Add Comments   Seen Back to Top     Satisfied Health Maintenance Topics   Back to Top Diabetic kidney evaluation - GFR measurement (Yearly)  Ordered on 05/13/2022

## 2022-05-27 NOTE — Telephone Encounter (Signed)
Patient called in and wanted someone to give her a call and go over her results from her scans she had this week. Thank you!

## 2022-05-27 NOTE — Telephone Encounter (Signed)
pt called in requesting to be contacted to have lab results communicated

## 2022-05-30 ENCOUNTER — Other Ambulatory Visit: Payer: Self-pay | Admitting: Nurse Practitioner

## 2022-05-30 ENCOUNTER — Encounter: Payer: Self-pay | Admitting: Internal Medicine

## 2022-05-30 ENCOUNTER — Inpatient Hospital Stay: Payer: BC Managed Care – PPO

## 2022-05-30 ENCOUNTER — Telehealth: Payer: Self-pay | Admitting: Nurse Practitioner

## 2022-05-30 ENCOUNTER — Telehealth: Payer: Self-pay | Admitting: *Deleted

## 2022-05-30 DIAGNOSIS — Z170 Estrogen receptor positive status [ER+]: Secondary | ICD-10-CM

## 2022-05-30 NOTE — Telephone Encounter (Signed)
Called patient and informed that we did not order the tests, her oncologist did. Their office will be calling her to go over the results.

## 2022-05-30 NOTE — Telephone Encounter (Signed)
Spoke to patient and Herb. Elevated tumor marker, hypercalcemia, findings of CT and bone scan concerning but not definitive for recurrence. Recommend pet scan to evaluate further. Patient agreeable. Also complains of burning with urination. Will check UA and repeat bmp today. Scheduling coordinating appointments and scans. Patient will follow up with Dr. [name] for results.

## 2022-05-30 NOTE — Telephone Encounter (Addendum)
Son called asking for results of CT and bone scan to be called to them

## 2022-06-01 ENCOUNTER — Telehealth: Payer: Self-pay | Admitting: Internal Medicine

## 2022-06-01 NOTE — Telephone Encounter (Signed)
Called patient 2 times to talk about scheduling PET scan at [facility]. Had to leave a voicemail with no return call.

## 2022-06-08 ENCOUNTER — Ambulatory Visit: Payer: BC Managed Care – PPO | Admitting: Primary Care

## 2022-06-08 ENCOUNTER — Ambulatory Visit: Payer: BC Managed Care – PPO

## 2022-06-10 ENCOUNTER — Encounter: Payer: Self-pay | Admitting: Internal Medicine

## 2022-06-15 ENCOUNTER — Other Ambulatory Visit: Payer: BC Managed Care – PPO

## 2022-06-16 ENCOUNTER — Encounter (HOSPITAL_COMMUNITY): Payer: BC Managed Care – PPO

## 2022-06-17 ENCOUNTER — Emergency Department (HOSPITAL_COMMUNITY): Payer: BC Managed Care – PPO

## 2022-06-17 ENCOUNTER — Ambulatory Visit: Payer: BC Managed Care – PPO | Admitting: Primary Care

## 2022-06-17 ENCOUNTER — Telehealth: Payer: Self-pay | Admitting: *Deleted

## 2022-06-17 ENCOUNTER — Inpatient Hospital Stay (HOSPITAL_COMMUNITY)
Admission: EM | Admit: 2022-06-17 | Discharge: 2022-06-23 | DRG: 542 | Disposition: A | Discharge status: Home / Self Care | Payer: BC Managed Care – PPO | Attending: Internal Medicine | Admitting: Internal Medicine

## 2022-06-17 DIAGNOSIS — E1142 Type 2 diabetes mellitus with diabetic polyneuropathy: Secondary | ICD-10-CM | POA: Diagnosis present

## 2022-06-17 DIAGNOSIS — R4182 Altered mental status, unspecified: Principal | ICD-10-CM

## 2022-06-17 DIAGNOSIS — E669 Obesity, unspecified: Secondary | ICD-10-CM | POA: Diagnosis present

## 2022-06-17 DIAGNOSIS — Z1152 Encounter for screening for COVID-19: Secondary | ICD-10-CM

## 2022-06-17 DIAGNOSIS — Z89512 Acquired absence of left leg below knee: Secondary | ICD-10-CM | POA: Diagnosis not present

## 2022-06-17 DIAGNOSIS — I2510 Atherosclerotic heart disease of native coronary artery without angina pectoris: Secondary | ICD-10-CM | POA: Diagnosis present

## 2022-06-17 DIAGNOSIS — Z79891 Long term (current) use of opiate analgesic: Secondary | ICD-10-CM

## 2022-06-17 DIAGNOSIS — I69354 Hemiplegia and hemiparesis following cerebral infarction affecting left non-dominant side: Secondary | ICD-10-CM | POA: Diagnosis not present

## 2022-06-17 DIAGNOSIS — R519 Headache, unspecified: Secondary | ICD-10-CM | POA: Diagnosis not present

## 2022-06-17 DIAGNOSIS — F319 Bipolar disorder, unspecified: Secondary | ICD-10-CM | POA: Diagnosis not present

## 2022-06-17 DIAGNOSIS — E876 Hypokalemia: Secondary | ICD-10-CM | POA: Diagnosis not present

## 2022-06-17 DIAGNOSIS — R112 Nausea with vomiting, unspecified: Secondary | ICD-10-CM | POA: Diagnosis not present

## 2022-06-17 DIAGNOSIS — K5730 Diverticulosis of large intestine without perforation or abscess without bleeding: Secondary | ICD-10-CM | POA: Diagnosis not present

## 2022-06-17 DIAGNOSIS — Z803 Family history of malignant neoplasm of breast: Secondary | ICD-10-CM

## 2022-06-17 DIAGNOSIS — G4730 Sleep apnea, unspecified: Secondary | ICD-10-CM | POA: Diagnosis not present

## 2022-06-17 DIAGNOSIS — Z170 Estrogen receptor positive status [ER+]: Secondary | ICD-10-CM | POA: Diagnosis not present

## 2022-06-17 DIAGNOSIS — N3000 Acute cystitis without hematuria: Secondary | ICD-10-CM | POA: Diagnosis not present

## 2022-06-17 DIAGNOSIS — N390 Urinary tract infection, site not specified: Secondary | ICD-10-CM | POA: Diagnosis not present

## 2022-06-17 DIAGNOSIS — B9620 Unspecified Escherichia coli [E. coli] as the cause of diseases classified elsewhere: Secondary | ICD-10-CM | POA: Diagnosis not present

## 2022-06-17 DIAGNOSIS — Z9012 Acquired absence of left breast and nipple: Secondary | ICD-10-CM

## 2022-06-17 DIAGNOSIS — M899 Disorder of bone, unspecified: Secondary | ICD-10-CM | POA: Diagnosis not present

## 2022-06-17 DIAGNOSIS — Z880 Allergy status to penicillin: Secondary | ICD-10-CM

## 2022-06-17 DIAGNOSIS — R911 Solitary pulmonary nodule: Secondary | ICD-10-CM | POA: Diagnosis present

## 2022-06-17 DIAGNOSIS — L89151 Pressure ulcer of sacral region, stage 1: Secondary | ICD-10-CM | POA: Diagnosis not present

## 2022-06-17 DIAGNOSIS — Z91048 Other nonmedicinal substance allergy status: Secondary | ICD-10-CM

## 2022-06-17 DIAGNOSIS — Z9981 Dependence on supplemental oxygen: Secondary | ICD-10-CM

## 2022-06-17 DIAGNOSIS — Z885 Allergy status to narcotic agent status: Secondary | ICD-10-CM

## 2022-06-17 DIAGNOSIS — E119 Type 2 diabetes mellitus without complications: Secondary | ICD-10-CM

## 2022-06-17 DIAGNOSIS — R197 Diarrhea, unspecified: Secondary | ICD-10-CM | POA: Diagnosis not present

## 2022-06-17 DIAGNOSIS — Z66 Do not resuscitate: Secondary | ICD-10-CM | POA: Diagnosis present

## 2022-06-17 DIAGNOSIS — I739 Peripheral vascular disease, unspecified: Secondary | ICD-10-CM | POA: Diagnosis present

## 2022-06-17 DIAGNOSIS — Z853 Personal history of malignant neoplasm of breast: Secondary | ICD-10-CM

## 2022-06-17 DIAGNOSIS — C50511 Malignant neoplasm of lower-outer quadrant of right female breast: Secondary | ICD-10-CM | POA: Diagnosis not present

## 2022-06-17 DIAGNOSIS — N182 Chronic kidney disease, stage 2 (mild): Secondary | ICD-10-CM | POA: Diagnosis not present

## 2022-06-17 DIAGNOSIS — E781 Pure hyperglyceridemia: Secondary | ICD-10-CM | POA: Diagnosis present

## 2022-06-17 DIAGNOSIS — Z833 Family history of diabetes mellitus: Secondary | ICD-10-CM

## 2022-06-17 DIAGNOSIS — I358 Other nonrheumatic aortic valve disorders: Secondary | ICD-10-CM | POA: Diagnosis present

## 2022-06-17 DIAGNOSIS — Z86718 Personal history of other venous thrombosis and embolism: Secondary | ICD-10-CM

## 2022-06-17 DIAGNOSIS — R404 Transient alteration of awareness: Secondary | ICD-10-CM | POA: Diagnosis not present

## 2022-06-17 DIAGNOSIS — Z7951 Long term (current) use of inhaled steroids: Secondary | ICD-10-CM

## 2022-06-17 DIAGNOSIS — C7951 Secondary malignant neoplasm of bone: Principal | ICD-10-CM | POA: Diagnosis present

## 2022-06-17 DIAGNOSIS — I130 Hypertensive heart and chronic kidney disease with heart failure and stage 1 through stage 4 chronic kidney disease, or unspecified chronic kidney disease: Secondary | ICD-10-CM | POA: Diagnosis present

## 2022-06-17 DIAGNOSIS — J4489 Other specified chronic obstructive pulmonary disease: Secondary | ICD-10-CM | POA: Diagnosis present

## 2022-06-17 DIAGNOSIS — E1151 Type 2 diabetes mellitus with diabetic peripheral angiopathy without gangrene: Secondary | ICD-10-CM | POA: Diagnosis present

## 2022-06-17 DIAGNOSIS — Z7984 Long term (current) use of oral hypoglycemic drugs: Secondary | ICD-10-CM

## 2022-06-17 DIAGNOSIS — G9341 Metabolic encephalopathy: Secondary | ICD-10-CM | POA: Diagnosis present

## 2022-06-17 DIAGNOSIS — R590 Localized enlarged lymph nodes: Secondary | ICD-10-CM | POA: Diagnosis not present

## 2022-06-17 DIAGNOSIS — R059 Cough, unspecified: Secondary | ICD-10-CM | POA: Diagnosis not present

## 2022-06-17 DIAGNOSIS — R531 Weakness: Secondary | ICD-10-CM | POA: Diagnosis not present

## 2022-06-17 DIAGNOSIS — G894 Chronic pain syndrome: Secondary | ICD-10-CM | POA: Diagnosis present

## 2022-06-17 DIAGNOSIS — Z888 Allergy status to other drugs, medicaments and biological substances status: Secondary | ICD-10-CM

## 2022-06-17 DIAGNOSIS — J984 Other disorders of lung: Secondary | ICD-10-CM

## 2022-06-17 DIAGNOSIS — K920 Hematemesis: Secondary | ICD-10-CM | POA: Diagnosis not present

## 2022-06-17 DIAGNOSIS — Z794 Long term (current) use of insulin: Secondary | ICD-10-CM

## 2022-06-17 DIAGNOSIS — I5032 Chronic diastolic (congestive) heart failure: Secondary | ICD-10-CM | POA: Diagnosis present

## 2022-06-17 DIAGNOSIS — R400 Somnolence: Secondary | ICD-10-CM | POA: Diagnosis not present

## 2022-06-17 DIAGNOSIS — Z79899 Other long term (current) drug therapy: Secondary | ICD-10-CM

## 2022-06-17 DIAGNOSIS — E785 Hyperlipidemia, unspecified: Secondary | ICD-10-CM | POA: Diagnosis present

## 2022-06-17 DIAGNOSIS — K219 Gastro-esophageal reflux disease without esophagitis: Secondary | ICD-10-CM | POA: Diagnosis present

## 2022-06-17 DIAGNOSIS — Z8249 Family history of ischemic heart disease and other diseases of the circulatory system: Secondary | ICD-10-CM

## 2022-06-17 DIAGNOSIS — Z89511 Acquired absence of right leg below knee: Secondary | ICD-10-CM

## 2022-06-17 DIAGNOSIS — R6889 Other general symptoms and signs: Secondary | ICD-10-CM | POA: Diagnosis not present

## 2022-06-17 DIAGNOSIS — F17210 Nicotine dependence, cigarettes, uncomplicated: Secondary | ICD-10-CM | POA: Diagnosis present

## 2022-06-17 DIAGNOSIS — R32 Unspecified urinary incontinence: Secondary | ICD-10-CM | POA: Diagnosis present

## 2022-06-17 DIAGNOSIS — R918 Other nonspecific abnormal finding of lung field: Secondary | ICD-10-CM | POA: Diagnosis not present

## 2022-06-17 DIAGNOSIS — R109 Unspecified abdominal pain: Secondary | ICD-10-CM | POA: Diagnosis not present

## 2022-06-17 DIAGNOSIS — G2581 Restless legs syndrome: Secondary | ICD-10-CM

## 2022-06-17 DIAGNOSIS — F419 Anxiety disorder, unspecified: Secondary | ICD-10-CM | POA: Diagnosis present

## 2022-06-17 DIAGNOSIS — Z743 Need for continuous supervision: Secondary | ICD-10-CM | POA: Diagnosis not present

## 2022-06-17 DIAGNOSIS — R9389 Abnormal findings on diagnostic imaging of other specified body structures: Secondary | ICD-10-CM | POA: Diagnosis not present

## 2022-06-17 DIAGNOSIS — E1122 Type 2 diabetes mellitus with diabetic chronic kidney disease: Secondary | ICD-10-CM | POA: Diagnosis present

## 2022-06-17 DIAGNOSIS — I10 Essential (primary) hypertension: Secondary | ICD-10-CM | POA: Diagnosis not present

## 2022-06-17 DIAGNOSIS — Z7982 Long term (current) use of aspirin: Secondary | ICD-10-CM

## 2022-06-17 DIAGNOSIS — Z22322 Carrier or suspected carrier of Methicillin resistant Staphylococcus aureus: Secondary | ICD-10-CM

## 2022-06-17 LAB — RENAL FUNCTION PANEL
Albumin: 3.2 g/dL — ABNORMAL LOW (ref 3.5–5.0)
Anion gap: 6 (ref 5–15)
BUN: 23 mg/dL — ABNORMAL HIGH (ref 6–20)
CO2: 27 mmol/L (ref 22–32)
Calcium: >15 mg/dL (ref 8.9–10.3)
Chloride: 110 mmol/L (ref 98–111)
Creatinine, Ser: 1.15 mg/dL — ABNORMAL HIGH (ref 0.44–1.00)
GFR, Estimated: 57 mL/min — ABNORMAL LOW (ref >60)
Glucose, Bld: 226 mg/dL — ABNORMAL HIGH (ref 70–99)
Phosphorus: 2.7 mg/dL (ref 2.5–4.6)
Potassium: 3.7 mmol/L (ref 3.5–5.1)
Sodium: 143 mmol/L (ref 135–145)

## 2022-06-17 LAB — URINALYSIS, ROUTINE W REFLEX MICROSCOPIC
Bilirubin Urine: NEGATIVE
Glucose, UA: >=500 mg/dL — AB
Ketones, ur: 5 mg/dL — AB
Nitrite: NEGATIVE
Protein, ur: >=300 mg/dL — AB
Specific Gravity, Urine: 1.018 (ref 1.005–1.030)
pH: 6 (ref 5.0–8.0)

## 2022-06-17 LAB — COMPREHENSIVE METABOLIC PANEL
ALT: 19 U/L (ref 0–44)
AST: 27 U/L (ref 15–41)
Albumin: 3.3 g/dL — ABNORMAL LOW (ref 3.5–5.0)
Alkaline Phosphatase: 88 U/L (ref 38–126)
Anion gap: 9 (ref 5–15)
BUN: 20 mg/dL (ref 6–20)
CO2: 24 mmol/L (ref 22–32)
Calcium: 14.7 mg/dL (ref 8.9–10.3)
Chloride: 109 mmol/L (ref 98–111)
Creatinine, Ser: 1.08 mg/dL — ABNORMAL HIGH (ref 0.44–1.00)
GFR, Estimated: >60 mL/min (ref >60)
Glucose, Bld: 227 mg/dL — ABNORMAL HIGH (ref 70–99)
Potassium: 4.2 mmol/L (ref 3.5–5.1)
Sodium: 142 mmol/L (ref 135–145)
Total Bilirubin: 1.1 mg/dL (ref 0.3–1.2)
Total Protein: 8.1 g/dL (ref 6.5–8.1)

## 2022-06-17 LAB — CBC WITH DIFFERENTIAL/PLATELET
Abs Immature Granulocytes: 0.05 10*3/uL (ref 0.00–0.07)
Basophils Absolute: 0.1 10*3/uL (ref 0.0–0.1)
Basophils Relative: 1 %
Eosinophils Absolute: 0.1 10*3/uL (ref 0.0–0.5)
Eosinophils Relative: 1 %
HCT: 45.3 % (ref 36.0–46.0)
Hemoglobin: 15.2 g/dL — ABNORMAL HIGH (ref 12.0–15.0)
Immature Granulocytes: 1 %
Lymphocytes Relative: 11 %
Lymphs Abs: 1.2 10*3/uL (ref 0.7–4.0)
MCH: 27.9 pg (ref 26.0–34.0)
MCHC: 33.6 g/dL (ref 30.0–36.0)
MCV: 83.1 fL (ref 80.0–100.0)
Monocytes Absolute: 0.6 10*3/uL (ref 0.1–1.0)
Monocytes Relative: 6 %
Neutro Abs: 8.7 10*3/uL — ABNORMAL HIGH (ref 1.7–7.7)
Neutrophils Relative %: 80 %
Platelets: 240 10*3/uL (ref 150–400)
RBC: 5.45 MIL/uL — ABNORMAL HIGH (ref 3.87–5.11)
RDW: 15.2 % (ref 11.5–15.5)
WBC: 10.8 10*3/uL — ABNORMAL HIGH (ref 4.0–10.5)
nRBC: 0 % (ref 0.0–0.2)

## 2022-06-17 LAB — RAPID URINE DRUG SCREEN, HOSP PERFORMED
Amphetamines: NOT DETECTED
Barbiturates: NOT DETECTED
Benzodiazepines: POSITIVE — AB
Cocaine: NOT DETECTED
Opiates: NOT DETECTED
Tetrahydrocannabinol: POSITIVE — AB

## 2022-06-17 LAB — RESP PANEL BY RT-PCR (FLU A&B, COVID) ARPGX2
Influenza A by PCR: NEGATIVE
Influenza B by PCR: NEGATIVE
SARS Coronavirus 2 by RT PCR: NEGATIVE

## 2022-06-17 LAB — LACTIC ACID, PLASMA
Lactic Acid, Venous: 1.2 mmol/L (ref 0.5–1.9)
Lactic Acid, Venous: 2 mmol/L (ref 0.5–1.9)

## 2022-06-17 LAB — CBG MONITORING, ED
Glucose-Capillary: 233 mg/dL — ABNORMAL HIGH (ref 70–99)
Glucose-Capillary: 171 mg/dL — ABNORMAL HIGH (ref 70–99)

## 2022-06-17 LAB — ETHANOL: Alcohol, Ethyl (B): <10 mg/dL (ref <10)

## 2022-06-17 LAB — HEMOGLOBIN AND HEMATOCRIT, BLOOD
HCT: 40 % (ref 36.0–46.0)
Hemoglobin: 13.3 g/dL (ref 12.0–15.0)

## 2022-06-17 MED ORDER — SODIUM CHLORIDE 0.9 % IV SOLN
2.0000 g | Freq: Once | INTRAVENOUS | Status: AC
  Administered 2022-06-17: 2 g via INTRAVENOUS
  Filled 2022-06-17: qty 20

## 2022-06-17 MED ORDER — NALOXONE HCL 2 MG/2ML IJ SOSY
PREFILLED_SYRINGE | INTRAMUSCULAR | Status: AC
  Administered 2022-06-17: 1 mg
  Filled 2022-06-17: qty 2

## 2022-06-17 MED ORDER — LACTATED RINGERS IV SOLN: INTRAVENOUS | Status: DC

## 2022-06-17 MED ORDER — PANTOPRAZOLE SODIUM 40 MG IV SOLR
40.0000 mg | Freq: Two times a day (BID) | INTRAVENOUS | Status: DC
  Administered 2022-06-17 – 2022-06-23 (×11): 40 mg via INTRAVENOUS
  Filled 2022-06-17 (×13): qty 10

## 2022-06-17 MED ORDER — IOHEXOL 300 MG/ML  SOLN
100.0000 mL | Freq: Once | INTRAMUSCULAR | Status: AC | PRN
  Administered 2022-06-17: 100 mL via INTRAVENOUS

## 2022-06-17 MED ORDER — NALOXONE HCL 0.4 MG/ML IJ SOLN
INTRAMUSCULAR | Status: AC
  Administered 2022-06-17: 0.4 mg
  Filled 2022-06-17: qty 1

## 2022-06-17 MED ORDER — SODIUM CHLORIDE 0.9 % IV SOLN: 2.0000 g | INTRAVENOUS | Status: DC

## 2022-06-17 MED ORDER — LACTATED RINGERS IV BOLUS (SEPSIS)
1000.0000 mL | Freq: Once | INTRAVENOUS | Status: AC
  Administered 2022-06-17: 1000 mL via INTRAVENOUS

## 2022-06-17 MED ORDER — INSULIN ASPART 100 UNIT/ML IJ SOLN
0.0000 [IU] | Freq: Every day | INTRAMUSCULAR | Status: DC
  Filled 2022-06-17: qty 0.05

## 2022-06-17 MED ORDER — INSULIN ASPART 100 UNIT/ML IJ SOLN
0.0000 [IU] | Freq: Three times a day (TID) | INTRAMUSCULAR | Status: DC
  Administered 2022-06-18 – 2022-06-19 (×6): 3 [IU] via SUBCUTANEOUS
  Administered 2022-06-20 (×2): 2 [IU] via SUBCUTANEOUS
  Administered 2022-06-21: 3 [IU] via SUBCUTANEOUS
  Administered 2022-06-23: 2 [IU] via SUBCUTANEOUS
  Filled 2022-06-17: qty 0.15

## 2022-06-17 MED ORDER — LACTATED RINGERS IV BOLUS
1000.0000 mL | Freq: Once | INTRAVENOUS | Status: AC
  Administered 2022-06-17: 1000 mL via INTRAVENOUS

## 2022-06-17 MED ORDER — ONDANSETRON HCL 4 MG/2ML IJ SOLN
4.0000 mg | Freq: Four times a day (QID) | INTRAMUSCULAR | Status: DC | PRN
  Administered 2022-06-18: 4 mg via INTRAVENOUS
  Filled 2022-06-17: qty 2

## 2022-06-17 MED ORDER — ONDANSETRON HCL 4 MG PO TABS: 4.0000 mg | ORAL_TABLET | Freq: Four times a day (QID) | ORAL | Status: DC | PRN

## 2022-06-17 MED ORDER — HYDRALAZINE HCL 20 MG/ML IJ SOLN
10.0000 mg | Freq: Three times a day (TID) | INTRAMUSCULAR | Status: DC | PRN
  Administered 2022-06-17 – 2022-06-18 (×2): 10 mg via INTRAVENOUS
  Filled 2022-06-17 (×2): qty 1

## 2022-06-17 MED ORDER — ZOLEDRONIC ACID 4 MG/5ML IV CONC
4.0000 mg | Freq: Once | INTRAVENOUS | Status: AC
  Administered 2022-06-17: 4 mg via INTRAVENOUS
  Filled 2022-06-17: qty 5

## 2022-06-17 MED ORDER — CALCITONIN (SALMON) 200 UNIT/ML IJ SOLN: 4.0000 [IU]/kg | Freq: Two times a day (BID) | INTRAMUSCULAR | Status: DC

## 2022-06-17 MED ORDER — LEVOFLOXACIN IN D5W 750 MG/150ML IV SOLN: 750.0000 mg | Freq: Once | INTRAVENOUS | Status: DC

## 2022-06-17 NOTE — Sepsis Progress Note (Signed)
Notified provider of need to order lactic acid. ° °
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
## 2022-06-17 NOTE — Sepsis Progress Note (Signed)
Sepsis protocol monitored by eLink 

## 2022-06-17 NOTE — ED Notes (Signed)
Called patient husband and informed she is at hospital.

## 2022-06-17 NOTE — ED Triage Notes (Signed)
Pt BIBA from home for lethargy, husband reports normally alert and ambulatory. Today weak and slow to respond. Last seen normal midnight. NVD 2 days ago. Pt endorses HA and abd pain, had some incontinence this am with strong odor. No recent falls. No facial droop or unilateral weakness noted. Not following commands properly. 20ga LF.  210/90 manual 164/92 auto HR 92 98% RA 20 RR Hx dm cbg 224 Temp 97.4

## 2022-06-17 NOTE — Telephone Encounter (Signed)
Call from Mr [name] reporting that he called EMS who is taking patient to [facility]. He states since 11 PM last night she has been staring and not really responding or taking a really long time to respond when spoken to. He states he has never seen her like this.

## 2022-06-17 NOTE — ED Provider Notes (Signed)
[facility] DEPT Provider Note   CSN: [id number] Arrival date & time: 06/17/22  1411     History  Chief Complaint  Patient presents with   Altered Mental Status    [name] is a 51 y.o. female.  51 year old patient presents with altered to status x 24 hours.  Patient has poorly been more lethargic.  Has had some foul-smelling urine.  Last seen normal was midnight.  2 days ago had nausea vomiting diarrhea.  Does note some headache and abdominal discomfort.  Has been incontinent of urine.  No recent falls.  EMS called and patient initially hypertensive but then her blood pressure improved.  Blood sugar was above 100.         Home Medications Prior to Admission medications   Medication Sig Start Date End Date Taking? Authorizing Provider  acetaminophen (TYLENOL) 325 MG tablet Take 2 tablets (650 mg total) by mouth every 4 (four) hours as needed for headache or mild pain. 09/23/21   Dwyane Dee, MD  albuterol (VENTOLIN HFA) 108 (90 Base) MCG/ACT inhaler Inhale 2 puffs into the lungs every 6 (six) hours as needed for wheezing or shortness of breath.    [provider]  ALPRAZolam Duanne Moron) 1 MG tablet Take 1 tablet (1 mg total) by mouth 4 (four) times daily as needed for anxiety. 10/14/21   [name], MD  anastrozole (ARIMIDEX) 1 MG tablet Take 1 mg by mouth daily. 04/28/22   [provider]  aspirin EC 81 MG tablet Take 81 mg by mouth daily before breakfast.    [provider]  atorvastatin (LIPITOR) 10 MG tablet Take 1 tablet (10 mg total) by mouth daily. For cholesterol 03/08/22   Pleas Koch, NP  budesonide-formoterol (SYMBICORT) 80-4.5 MCG/ACT inhaler INHALE 2 PUFFS BY MOUTH EVERY 12 HOURS TO PREVENT COUGH OR WHEEZING *RINSE MOUTH AFTER EACH USE* 12/01/20   Pleas Koch, NP  busPIRone (BUSPAR) 30 MG tablet Take 30 mg by mouth 2 (two) times daily. 05/08/22   [provider]  desvenlafaxine (PRISTIQ) 100  MG 24 hr tablet Take 100 mg by mouth daily.    [provider]  Empagliflozin-metFORMIN HCl ER (SYNJARDY XR) 25-1000 MG TB24 Take 1 tablet by mouth daily. For diabetes. 04/27/22   Pleas Koch, NP  insulin aspart (NOVOLOG) 100 UNIT/ML FlexPen Inject 10 units into the skin before breakfast and inject 10 units into the skin before dinner for diabetes. 04/02/22   [name], MD  insulin glargine (LANTUS) 100 UNIT/ML Solostar Pen Inject 20 Units into the skin daily. 04/02/22   [name], MD  naloxone [facility]) nasal spray 4 mg/0.1 mL  01/01/22   [provider]  nicotine (NICODERM CQ - DOSED IN MG/24 HOURS) 14 mg/24hr patch 14 mg daily. 05/04/22   [provider]  ondansetron (ZOFRAN) 8 MG tablet One pill every 8 hours as needed for nausea/vomitting. 05/06/22   Pleas Koch, NP  oxyCODONE (OXY IR/ROXICODONE) 5 MG immediate release tablet Take 1 tablet (5 mg total) by mouth every 6 (six) hours as needed for severe pain. Patient not taking: Reported on 05/17/2022 04/23/22   Schnier, Dolores Lory, MD  OXYGEN Inhale 2-4 L into the lungs 2 (two) times daily as needed (shortness of breath).    [provider]  pregabalin (LYRICA) 100 MG capsule Take 1 capsule (100 mg total) by mouth 2 (two) times daily. 04/02/22   [name], MD  rOPINIRole (REQUIP) 1  MG tablet TAKE 1 TABLET BY MOUTH ONCE DAILY AT BEDTIME FOR RESTLESS LEG 03/27/22   Pleas Koch, NP  spironolactone (ALDACTONE) 50 MG tablet Take 50 mg by mouth daily. 02/16/21   [provider]  topiramate (TOPAMAX) 50 MG tablet Take 1 tablet (50 mg total) by mouth daily. 04/03/22   [name], MD  traZODone (DESYREL) 100 MG tablet Take 200 mg by mouth at bedtime. 05/08/22   [provider]  traZODone (DESYREL) 50 MG tablet Take 1 tablet (50 mg total) by mouth at bedtime. 04/02/22   [name], MD      Allergies    Adhesive [tape], Metoprolol, Montelukast, Morphine sulfate,  Penicillins, Silicone, Diltiazem, Gabapentin, Midodrine, and Percocet [oxycodone-acetaminophen]    Review of Systems   Review of Systems  Unable to perform ROS: Acuity of condition    Physical Exam Updated Vital Signs LMP  (LMP Unknown)  Physical Exam Vitals and nursing note reviewed.  Constitutional:      General: She is not in acute distress.    Appearance: Normal appearance. She is well-developed. She is not toxic-appearing.  HENT:     Head: Normocephalic and atraumatic.  Eyes:     General: Lids are normal.     Conjunctiva/sclera: Conjunctivae normal.     Pupils: Pupils are equal, round, and reactive to light.  Neck:     Thyroid: No thyroid mass.     Trachea: No tracheal deviation.  Cardiovascular:     Rate and Rhythm: Normal rate and regular rhythm.     Heart sounds: Normal heart sounds. No murmur heard.    No gallop.  Pulmonary:     Effort: Pulmonary effort is normal. No respiratory distress.     Breath sounds: Normal breath sounds. No stridor. No decreased breath sounds, wheezing, rhonchi or rales.  Abdominal:     General: There is no distension.     Palpations: Abdomen is soft.     Tenderness: There is no abdominal tenderness. There is no rebound.  Musculoskeletal:        General: No tenderness. Normal range of motion.     Cervical back: Normal range of motion and neck supple.  Skin:    General: Skin is warm and dry.     Findings: No abrasion or rash.  Neurological:     Mental Status: She is alert and oriented to person, place, and time. Mental status is at baseline.     GCS: GCS eye subscore is 4. GCS verbal subscore is 5. GCS motor subscore is 6.     Cranial Nerves: No cranial nerve deficit.     Sensory: No sensory deficit.     Motor: Motor function is intact.     Comments: Moves all extremities at this time.  Psychiatric:        Attention and Perception: She is inattentive.        Mood and Affect: Affect is flat.        Speech: Speech is delayed.         Behavior: Behavior is slowed.     ED Results / Procedures / Treatments   Labs (all labs ordered are listed, but only abnormal results are displayed) Labs Reviewed  RESP PANEL BY RT-PCR (FLU A&B, COVID) ARPGX2  RAPID URINE DRUG SCREEN, HOSP PERFORMED  COMPREHENSIVE METABOLIC PANEL  CBC WITH DIFFERENTIAL/PLATELET  URINALYSIS, ROUTINE W REFLEX MICROSCOPIC  ETHANOL    EKG EKG Interpretation  Date/Time:  Friday June 17 2022  14:19:36 EST Ventricular Rate:  88 PR Interval:  157 QRS Duration: 127 QT Interval:  364 QTC Calculation: 441 R Axis:   -68 Text Interpretation: Sinus rhythm Probable left atrial enlargement RBBB and LAFB No significant change since last tracing Confirmed by Lacretia Leigh ([id number]) on 06/17/2022 2:31:06 PM  Radiology No results found.  Procedures Procedures    Medications Ordered in ED Medications  naloxone (NARCAN) 0.4 MG/ML injection (has no administration in time range)  lactated ringers bolus 1,000 mL (has no administration in time range)  lactated ringers infusion (has no administration in time range)    ED Course/ Medical Decision Making/ A&P                           Medical Decision Making Amount and/or Complexity of Data Reviewed Labs: ordered. Radiology: ordered.  Risk Prescription drug management.  Patient is EKG shows normal sinus rhythm.  Unchanged from prior. Patient given IV fluids here on arrival.  She was not febrile.  Given Narcan x 2 without any response.  Patient had a chest x-ray which did not show any acute findings.  Patient has some.  Abdominal discomfort.  Abdominal CT performed and there was no intra-abdominal process but does have possible hilar mass.  I did speak with the radiologist about this.  He recommended patient have a chest CT with IV contrast which has been ordered.  He also has a lytic lesion on her superior aspect of her right femoral neck.  Concern for metastatic disease of unknown primary.  Patient's  urinalysis is positive for infection.  Patient started on IV Levaquin as she has a severe penicillin allergy.  A CT per interpretation not show any acute findings.  Given patient's mental status changes due to likely UTI  CRITICAL CARE Performed by: Leota Jacobsen Total critical care time: 45 minutes Critical care time was exclusive of separately billable procedures and treating other patients. Critical care was necessary to treat or prevent imminent or life-threatening deterioration. Critical care was time spent personally by me on the following activities: development of treatment plan with patient and/or surrogate as well as nursing, discussions with consultants, evaluation of patient's response to treatment, examination of patient, obtaining history from patient or surrogate, ordering and performing treatments and interventions, ordering and review of laboratory studies, ordering and review of radiographic studies, pulse oximetry and re-evaluation of patient's condition.         Final Clinical Impression(s) / ED Diagnoses Final diagnoses:  None    Rx / DC Orders ED Discharge Orders     None         Lacretia Leigh, MD 06/17/22 1545

## 2022-06-17 NOTE — ED Notes (Signed)
Pt had coffee ground emesis, pt cleaned,  given new gown and bedding. MD notifed

## 2022-06-17 NOTE — Sepsis Progress Note (Signed)
Per bedside RN blood cultures were drawn before antibiotics given

## 2022-06-17 NOTE — ED Notes (Signed)
Pt given apple juice. Husband with pt.

## 2022-06-18 ENCOUNTER — Other Ambulatory Visit: Payer: Self-pay

## 2022-06-18 ENCOUNTER — Encounter (HOSPITAL_COMMUNITY): Payer: Self-pay | Admitting: Internal Medicine

## 2022-06-18 DIAGNOSIS — R197 Diarrhea, unspecified: Secondary | ICD-10-CM

## 2022-06-18 DIAGNOSIS — Z170 Estrogen receptor positive status [ER+]: Secondary | ICD-10-CM

## 2022-06-18 DIAGNOSIS — C50511 Malignant neoplasm of lower-outer quadrant of right female breast: Secondary | ICD-10-CM | POA: Diagnosis not present

## 2022-06-18 DIAGNOSIS — N3000 Acute cystitis without hematuria: Secondary | ICD-10-CM | POA: Diagnosis not present

## 2022-06-18 DIAGNOSIS — R112 Nausea with vomiting, unspecified: Secondary | ICD-10-CM | POA: Diagnosis not present

## 2022-06-18 DIAGNOSIS — G9341 Metabolic encephalopathy: Secondary | ICD-10-CM | POA: Diagnosis not present

## 2022-06-18 DIAGNOSIS — R109 Unspecified abdominal pain: Secondary | ICD-10-CM

## 2022-06-18 DIAGNOSIS — K920 Hematemesis: Secondary | ICD-10-CM

## 2022-06-18 DIAGNOSIS — M899 Disorder of bone, unspecified: Secondary | ICD-10-CM

## 2022-06-18 LAB — COMPREHENSIVE METABOLIC PANEL
ALT: 17 U/L (ref 0–44)
AST: 28 U/L (ref 15–41)
Albumin: 3 g/dL — ABNORMAL LOW (ref 3.5–5.0)
Alkaline Phosphatase: 78 U/L (ref 38–126)
Anion gap: 9 (ref 5–15)
BUN: 19 mg/dL (ref 6–20)
CO2: 21 mmol/L — ABNORMAL LOW (ref 22–32)
Calcium: 14.3 mg/dL (ref 8.9–10.3)
Chloride: 115 mmol/L — ABNORMAL HIGH (ref 98–111)
Creatinine, Ser: 1.09 mg/dL — ABNORMAL HIGH (ref 0.44–1.00)
GFR, Estimated: >60 mL/min (ref >60)
Glucose, Bld: 176 mg/dL — ABNORMAL HIGH (ref 70–99)
Potassium: 3 mmol/L — ABNORMAL LOW (ref 3.5–5.1)
Sodium: 145 mmol/L (ref 135–145)
Total Bilirubin: 0.8 mg/dL (ref 0.3–1.2)
Total Protein: 7.3 g/dL (ref 6.5–8.1)

## 2022-06-18 LAB — GLUCOSE, CAPILLARY
Glucose-Capillary: 162 mg/dL — ABNORMAL HIGH (ref 70–99)
Glucose-Capillary: 151 mg/dL — ABNORMAL HIGH (ref 70–99)
Glucose-Capillary: 199 mg/dL — ABNORMAL HIGH (ref 70–99)
Glucose-Capillary: 172 mg/dL — ABNORMAL HIGH (ref 70–99)

## 2022-06-18 LAB — CBC
HCT: 41.9 % (ref 36.0–46.0)
Hemoglobin: 14.1 g/dL (ref 12.0–15.0)
MCH: 27.9 pg (ref 26.0–34.0)
MCHC: 33.7 g/dL (ref 30.0–36.0)
MCV: 83 fL (ref 80.0–100.0)
Platelets: 209 10*3/uL (ref 150–400)
RBC: 5.05 MIL/uL (ref 3.87–5.11)
RDW: 15.1 % (ref 11.5–15.5)
WBC: 11.7 10*3/uL — ABNORMAL HIGH (ref 4.0–10.5)
nRBC: 0 % (ref 0.0–0.2)

## 2022-06-18 LAB — VITAMIN D 25 HYDROXY (VIT D DEFICIENCY, FRACTURES): Vit D, 25-Hydroxy: 7.6 ng/mL — ABNORMAL LOW (ref 30–100)

## 2022-06-18 LAB — LACTIC ACID, PLASMA: Lactic Acid, Venous: 0.8 mmol/L (ref 0.5–1.9)

## 2022-06-18 LAB — MRSA NEXT GEN BY PCR, NASAL: MRSA by PCR Next Gen: DETECTED — AB

## 2022-06-18 LAB — HEMOGLOBIN AND HEMATOCRIT, BLOOD
HCT: 34.8 % — ABNORMAL LOW (ref 36.0–46.0)
Hemoglobin: 11.9 g/dL — ABNORMAL LOW (ref 12.0–15.0)

## 2022-06-18 MED ORDER — CALCITONIN (SALMON) 200 UNIT/ML IJ SOLN
4.0000 [IU]/kg | Freq: Two times a day (BID) | INTRAMUSCULAR | Status: AC
  Administered 2022-06-18 – 2022-06-19 (×4): 286 [IU] via SUBCUTANEOUS
  Filled 2022-06-18 (×4): qty 1.43

## 2022-06-18 MED ORDER — SODIUM CHLORIDE 0.9 % IV SOLN
2.0000 g | Freq: Two times a day (BID) | INTRAVENOUS | Status: DC
  Administered 2022-06-18 – 2022-06-20 (×4): 2 g via INTRAVENOUS
  Filled 2022-06-18 (×4): qty 12.5

## 2022-06-18 MED ORDER — MUPIROCIN 2 % EX OINT
1.0000 | TOPICAL_OINTMENT | Freq: Two times a day (BID) | CUTANEOUS | Status: DC
  Administered 2022-06-18 – 2022-06-23 (×9): 1 via NASAL
  Filled 2022-06-18 (×5): qty 22

## 2022-06-18 MED ORDER — SODIUM CHLORIDE 0.9 % IV SOLN: INTRAVENOUS | Status: DC

## 2022-06-18 MED ORDER — ENOXAPARIN SODIUM 40 MG/0.4ML IJ SOSY
40.0000 mg | PREFILLED_SYRINGE | Freq: Every day | INTRAMUSCULAR | Status: DC
  Administered 2022-06-18 – 2022-06-23 (×6): 40 mg via SUBCUTANEOUS
  Filled 2022-06-18 (×6): qty 0.4

## 2022-06-18 MED ORDER — FUROSEMIDE 10 MG/ML IJ SOLN
40.0000 mg | Freq: Once | INTRAMUSCULAR | Status: AC
  Administered 2022-06-18: 40 mg via INTRAVENOUS
  Filled 2022-06-18: qty 4

## 2022-06-18 MED ORDER — POTASSIUM CHLORIDE 10 MEQ/100ML IV SOLN
10.0000 meq | INTRAVENOUS | Status: AC
  Administered 2022-06-18 (×6): 10 meq via INTRAVENOUS
  Filled 2022-06-18 (×6): qty 100

## 2022-06-18 MED ORDER — CHLORHEXIDINE GLUCONATE CLOTH 2 % EX PADS
6.0000 | MEDICATED_PAD | Freq: Every day | CUTANEOUS | Status: DC
  Administered 2022-06-19 – 2022-06-22 (×4): 6 via TOPICAL

## 2022-06-18 NOTE — Assessment & Plan Note (Addendum)
-   wide differential: malignancy highest suspicion (Br cancer recurrence with bone mets vs MM) vs med side effect of anastrazole (lower chance?) - Patient presumed to be symptomatic at this time with acute encephalopathy along with generalized abdominal pains and N/V on admission - cCa on admission 15.3 mg/dL; she was given IVF and zometa -Started on calcitonin on 06/18/2022 and has had good response with improvement in corrected calcium level along with some improvement in mentation - calcitonin x 4 doses completed - s/p lasix x 1 on 06/18/22 as well - give lasix again on 12/11 - previously low suspicion for malignancy per oncology at last OV on 05/13/22, but since visit has had bone scan on 05/26/22 and CT chest 05/25/22; concern now for potential recurrence and she was scheduled for PET scan on 12/27; current workup still supports recurrence with CT's on admission - seen by oncology on 12/11 -Corrected calcium continues to improve, down to 11 mg/dL this morning

## 2022-06-18 NOTE — Assessment & Plan Note (Signed)
-   follows with Dr. [name]; last OV 05/11/22 - ER positive - diagnosed April 2021 s/p masectomy and treated with tamoxifen (then stopped) and anastrazole and lupron  - possible that anastrazole contributing to hypercalcemia as well? - check Ca 15-3 and Ca 27.29 -Evaluated by oncology on 06/20/2022.  Patient recommended for pulmonology consultation to see if patient able to undergo bronchoscopy with biopsy -Appreciate pulmonology evaluation, EBUS being planned

## 2022-06-18 NOTE — Consult Note (Signed)
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 hemoglobin without any significant bump in her BUN.  She is not hypotensive or tachycardic. Overall very low suspicion for any clinically significant GI bleeding at this time.  Recommend continued medical therapy with Protonix 40 mg IV twice daily and Zofran as needed. Defer management of hypercalcemia/encephalopathy to primary team/oncology  Coffee-ground emesis x 1 - No further evidence of upper GI bleed - Hemodynamically stable - No plans for endoscopic evaluation at this time - Continue Protonix IV twice daily today, transition to p.o. tomorrow if no further concerns for bleeding - Okay to advance diet as tolerated from GI standpoint  Abdominal pain, nausea/vomiting - Suspect secondary to hypercalcemia - She had no tenderness to palpation on my exam - No further workup recommended at this time.   GI will sign off at this time.  Please contact us if she demonstrates any further concerns for GI bleeding, or if her  GI symptoms do not improve with improvement in her hypercalcemia.  Thanks   LOS: 1 day   Daryel November  06/18/2022, 9:11 AM

## 2022-06-18 NOTE — Assessment & Plan Note (Signed)
-   Continue SSI and CBG monitoring -Last A1c 10.3% on 03/04/2022

## 2022-06-18 NOTE — Progress Notes (Signed)
Progress Note    [name]   TDV:[id number]  DOB: [birth year]  DOA: 06/17/2022     1 PCP: Pleas Koch, NP  Initial CC: AMS  Hospital Course: [name] is a 51 yo female with PMH right-sided breast cancer (on anastrozole and Lupron), asthma, anxiety, CAD, diastolic CHF, CKD, COPD, DM II, HTN, HLD, seizures, CVA. She presented to the ER with altered mentation.  Her husband supplied most of HPI and noted she had been nauseous with vomiting for approximately 3 to 4 days prior to admission.  She then began developing confusion and talking nonsense.  She also became incontinent of urine  She was found to have hypercalcemia on admission (corrected calcium, 15.3 mg/dL).  Urinalysis was notable for small LE, negative nitrite, 21-50 WBC, rare bacteria. WBC 10.8, 1% bands.  She has been undergoing outpatient evaluation with oncology regarding hypercalcemia. Recent bone scan on 05/26/2022 did show some radiotracer uptake in the left frontal bone and right greater trochanter. CT chest/abdomen/pelvis on admission notable for lytic lesion involving right femoral neck consistent with bony metastatic disease.  Also noted soft tissue density at the right hilum concerning for possible neoplasm.  Scattered pulmonary nodules with increase in size of right lower lobe pulmonary nodule noted. CT head unremarkable.  She was admitted for encephalopathy and treatment of hypercalcemia as well as presumed UTI.  Interval History:  Seen this morning resting in bed in no distress but overtly confused.  She only answers "[name]" to any question even her name. Seems to have some abdominal discomfort with palpation however she cannot voice any concerns. Tried to call son to update, no answer.  Assessment and Plan: * Hypercalcemia - wide differential: malignancy highest suspicion (Br cancer recurrence with bone mets vs MM) vs med side effect of anastrazole (lower chance?) - Patient presumed to be  symptomatic at this time with acute encephalopathy along with generalized abdominal pains and N/V on admission - cCa on admission 15.3 mg/dL; she was given IVF and zometa - this morning patient still symptomatic and cCa 15.1 mg/dL  - start calcitonin and trend CMP - lasix x 1 - previously low suspicion for malignancy per oncology at last OV on 05/13/22, but since visit has had bone scan on 05/26/22 and CT chest 05/25/22; concern now for potential recurrence and she was scheduled for PET scan on 12/27; current workup still supports recurrence with CT's on admission - will further discuss with oncology regarding next steps while hospitalized   UTI (urinary tract infection) - UA noted with small LE, negative nitrite, 21-50 WBC, rare bacteria - Received Rocephin on admission.  Given hypercalcemia, will transition to cefepime - Follow-up urine culture  Acute metabolic encephalopathy - Suspected multifactorial in setting of underlying severe hypercalcemia and possible UTI - Continuing treatment for both and will monitor mentation changes  Carcinoma of lower-outer quadrant of right breast in female, estrogen receptor positive ([name]) - follows with Dr. [name]; last OV 05/11/22 - ER positive - diagnosed April 2021 s/p masectomy and treated with tamoxifen (then stopped) and anastrazole and lupron  - possible that anastrazole contributing to hypercalcemia as well? - check Ca 15-3 and Ca 27.29  Lytic bone lesion of right femur - Suspected etiology breast cancer recurrence with metastasis versus new second primary malignancy with mets versus less likely multiple myeloma - checking MM panel - will discuss with oncology on Monday regarding where preferred biopsy is (lung/hilum vs bone)  Hematemesis-resolved as of 06/18/2022 - No further recurrence  since admission - Patient also not a good candidate for endoscopies at this time given hypercalcemia and encephalopathy - Evaluated by GI, appreciate  assistance.  For now continuing Protonix and advancing diet.  No plans for invasive workup  DM2 (diabetes mellitus, type 2) (HCC) - Continue SSI and CBG monitoring -Last A1c 10.3% on 03/04/2022  HTN (hypertension) - No meds noted on med rec - Trend BP and will treat as needed  Hyperlipidemia - Hold statin  Coronary artery disease involving native heart without angina pectoris - Hold aspirin and statin for now  Chronic heart failure with preserved ejection fraction (HFpEF) ([place]) - no s/s exac - last echo 11/09/21: EF 60-65%, no RWMA, normal diastology  Bipolar disorder ([place]) - Hold home meds for now, will resume slowly as mentation hopefully improves  COPD with chronic bronchitis - no s/s exac   Old records reviewed in assessment of this patient  Antimicrobials: Rocephin 12/8 >> 12/9 Cefepime 12/9 >> current   DVT prophylaxis:  Lovenox   Code Status:   Code Status: DNR  Mobility Assessment (last 72 hours)     Mobility Assessment     Row Name 06/18/22 0800 06/18/22 0600         Does patient have an order for bedrest or is patient medically unstable -- Yes- Bedfast (Level 1) - Complete      What is the highest level of mobility based on the progressive mobility assessment? Level 1 (Bedfast) - Unable to balance while sitting on edge of bed Level 1 (Bedfast) - Unable to balance while sitting on edge of bed      Is the above level different from baseline mobility prior to current illness? Yes - Recommend PT order Yes - Recommend PT order               Barriers to discharge:  Disposition Plan:  Home 3-4 days Status is: Inpt  Objective: Blood pressure (!) 172/71, pulse 85, temperature 98.1 F (36.7 C), temperature source Oral, resp. rate 18, height _0  (1.499 m), weight 71.5 kg, SpO2 100 %.  Examination:  Physical Exam Constitutional:      Comments: Grossly confused appearing adult woman lying in bed in no distress  HENT:     Head: Normocephalic and  atraumatic.     Mouth/Throat:     Mouth: Mucous membranes are moist.  Eyes:     Extraocular Movements: Extraocular movements intact.  Cardiovascular:     Rate and Rhythm: Normal rate and regular rhythm.  Pulmonary:     Effort: Pulmonary effort is normal.     Breath sounds: Normal breath sounds.  Abdominal:     General: Bowel sounds are normal. There is no distension.     Palpations: Abdomen is soft.     Comments: Generalized tenderness throughout, no rebound or guarding  Musculoskeletal:        General: Normal range of motion.     Cervical back: Normal range of motion and neck supple.  Skin:    General: Skin is warm.  Neurological:     Comments: Unable to answer questions or follow commands.  Answers "Debby Bud bragg" to all questions      Consultants:    Procedures:    Data Reviewed: Results for orders placed or performed during the hospital encounter of 06/17/22 (from the past 24 hour(s))  CBG monitoring, ED     Status: Abnormal   Collection Time: 06/17/22  2:21 PM  Result Value Ref Range  Glucose-Capillary 233 (H) 70 - 99 mg/dL  Rapid urine drug screen (hospital performed)     Status: Abnormal   Collection Time: 06/17/22  2:30 PM  Result Value Ref Range   Opiates NONE DETECTED NONE DETECTED   Cocaine NONE DETECTED NONE DETECTED   Benzodiazepines POSITIVE (A) NONE DETECTED   Amphetamines NONE DETECTED NONE DETECTED   Tetrahydrocannabinol POSITIVE (A) NONE DETECTED   Barbiturates NONE DETECTED NONE DETECTED  Comprehensive metabolic panel     Status: Abnormal   Collection Time: 06/17/22  2:30 PM  Result Value Ref Range   Sodium 142 135 - 145 mmol/L   Potassium 4.2 3.5 - 5.1 mmol/L   Chloride 109 98 - 111 mmol/L   CO2 24 22 - 32 mmol/L   Glucose, Bld 227 (H) 70 - 99 mg/dL   BUN 20 6 - 20 mg/dL   Creatinine, Ser 1.08 (H) 0.44 - 1.00 mg/dL   Calcium 14.7 (HH) 8.9 - 10.3 mg/dL   Total Protein 8.1 6.5 - 8.1 g/dL   Albumin 3.3 (L) 3.5 - 5.0 g/dL   AST 27 15 - 41 U/L    ALT 19 0 - 44 U/L   Alkaline Phosphatase 88 38 - 126 U/L   Total Bilirubin 1.1 0.3 - 1.2 mg/dL   GFR, Estimated >60 >60 mL/min   Anion gap 9 5 - 15  CBC with Differential/Platelet     Status: Abnormal   Collection Time: 06/17/22  2:30 PM  Result Value Ref Range   WBC 10.8 (H) 4.0 - 10.5 K/uL   RBC 5.45 (H) 3.87 - 5.11 MIL/uL   Hemoglobin 15.2 (H) 12.0 - 15.0 g/dL   HCT 45.3 36.0 - 46.0 %   MCV 83.1 80.0 - 100.0 fL   MCH 27.9 26.0 - 34.0 pg   MCHC 33.6 30.0 - 36.0 g/dL   RDW 15.2 11.5 - 15.5 %   Platelets 240 150 - 400 K/uL   nRBC 0.0 0.0 - 0.2 %   Neutrophils Relative % 80 %   Neutro Abs 8.7 (H) 1.7 - 7.7 K/uL   Lymphocytes Relative 11 %   Lymphs Abs 1.2 0.7 - 4.0 K/uL   Monocytes Relative 6 %   Monocytes Absolute 0.6 0.1 - 1.0 K/uL   Eosinophils Relative 1 %   Eosinophils Absolute 0.1 0.0 - 0.5 K/uL   Basophils Relative 1 %   Basophils Absolute 0.1 0.0 - 0.1 K/uL   Immature Granulocytes 1 %   Abs Immature Granulocytes 0.05 0.00 - 0.07 K/uL  Urinalysis, Routine w reflex microscopic Urine, In & Out Cath     Status: Abnormal   Collection Time: 06/17/22  2:30 PM  Result Value Ref Range   Color, Urine YELLOW YELLOW   APPearance HAZY (A) CLEAR   Specific Gravity, Urine 1.018 1.005 - 1.030   pH 6.0 5.0 - 8.0   Glucose, UA >=500 (A) NEGATIVE mg/dL   Hgb urine dipstick SMALL (A) NEGATIVE   Bilirubin Urine NEGATIVE NEGATIVE   Ketones, ur 5 (A) NEGATIVE mg/dL   Protein, ur >=300 (A) NEGATIVE mg/dL   Nitrite NEGATIVE NEGATIVE   Leukocytes,Ua SMALL (A) NEGATIVE   RBC / HPF 11-20 0 - 5 RBC/hpf   WBC, UA 21-50 0 - 5 WBC/hpf   Bacteria, UA RARE (A) NONE SEEN   Squamous Epithelial / LPF 0-5 0 - 5   WBC Clumps PRESENT   Resp Panel by RT-PCR (Flu A&B, Covid) Anterior Nasal Swab     Status:  None   Collection Time: 06/17/22  2:30 PM   Specimen: Anterior Nasal Swab  Result Value Ref Range   SARS Coronavirus 2 by RT PCR NEGATIVE NEGATIVE   Influenza A by PCR NEGATIVE NEGATIVE    Influenza B by PCR NEGATIVE NEGATIVE  Ethanol     Status: None   Collection Time: 06/17/22  2:30 PM  Result Value Ref Range   Alcohol, Ethyl (B) <10 <10 mg/dL  Renal function panel     Status: Abnormal   Collection Time: 06/17/22  4:08 PM  Result Value Ref Range   Sodium 143 135 - 145 mmol/L   Potassium 3.7 3.5 - 5.1 mmol/L   Chloride 110 98 - 111 mmol/L   CO2 27 22 - 32 mmol/L   Glucose, Bld 226 (H) 70 - 99 mg/dL   BUN 23 (H) 6 - 20 mg/dL   Creatinine, Ser 1.15 (H) 0.44 - 1.00 mg/dL   Calcium >15.0 (HH) 8.9 - 10.3 mg/dL   Phosphorus 2.7 2.5 - 4.6 mg/dL   Albumin 3.2 (L) 3.5 - 5.0 g/dL   GFR, Estimated 57 (L) >60 mL/min   Anion gap 6 5 - 15  Culture, blood (Routine X 2) w Reflex to ID Panel     Status: None (Preliminary result)   Collection Time: 06/17/22  4:08 PM   Specimen: BLOOD LEFT HAND  Result Value Ref Range   Specimen Description      BLOOD LEFT HAND Performed at [facility], [address], [place], [place] [address]    Special Requests      BOTTLES DRAWN AEROBIC AND ANAEROBIC Blood Culture results may not be optimal due to an inadequate volume of blood received in culture bottles Performed at [facility], [place] [address], [place], [place] [address]    Culture      NO GROWTH < 24 HOURS Performed at [facility] [address], [place], [place] [address]    Report Status PENDING   Culture, blood (Routine X 2) w Reflex to ID Panel     Status: None (Preliminary result)   Collection Time: 06/17/22  4:08 PM   Specimen: BLOOD LEFT FOREARM  Result Value Ref Range   Specimen Description      BLOOD LEFT FOREARM Performed at [facility], [place] [address], [place], [place] [address]    Special Requests      BOTTLES DRAWN AEROBIC AND ANAEROBIC Blood Culture results may not be optimal due to an excessive volume of blood received in culture bottles Performed at [facility] [address], [place], [place] [address]     Culture      NO GROWTH < 24 HOURS Performed at [facility] [address] [place], [place] [address]    Report Status PENDING   Lactic acid, plasma     Status: None   Collection Time: 06/17/22  4:08 PM  Result Value Ref Range   Lactic Acid, Venous 1.2 0.5 - 1.9 mmol/L  VITAMIN D 25 Hydroxy (Vit-D Deficiency, Fractures)     Status: Abnormal   Collection Time: 06/17/22  4:30 PM  Result Value Ref Range   Vit D, 25-Hydroxy 7.60 (L) 30 - 100 ng/mL  Lactic acid, plasma     Status: Abnormal   Collection Time: 06/17/22  6:50 PM  Result Value Ref Range   Lactic Acid, Venous 2.0 (HH) 0.5 - 1.9 mmol/L  Hemoglobin and hematocrit, blood  Status: None   Collection Time: 06/17/22  6:50 PM  Result Value Ref Range   Hemoglobin 13.3 12.0 - 15.0 g/dL   HCT 40.0 36.0 - 46.0 %  CBG monitoring, ED     Status: Abnormal   Collection Time: 06/17/22  9:48 PM  Result Value Ref Range   Glucose-Capillary 171 (H) 70 - 99 mg/dL  Hemoglobin and hematocrit, blood     Status: Abnormal   Collection Time: 06/18/22 12:26 AM  Result Value Ref Range   Hemoglobin 11.9 (L) 12.0 - 15.0 g/dL   HCT 34.8 (L) 36.0 - 46.0 %  Lactic acid, plasma     Status: None   Collection Time: 06/18/22 12:26 AM  Result Value Ref Range   Lactic Acid, Venous 0.8 0.5 - 1.9 mmol/L  Comprehensive metabolic panel     Status: Abnormal   Collection Time: 06/18/22  6:02 AM  Result Value Ref Range   Sodium 145 135 - 145 mmol/L   Potassium 3.0 (L) 3.5 - 5.1 mmol/L   Chloride 115 (H) 98 - 111 mmol/L   CO2 21 (L) 22 - 32 mmol/L   Glucose, Bld 176 (H) 70 - 99 mg/dL   BUN 19 6 - 20 mg/dL   Creatinine, Ser 1.09 (H) 0.44 - 1.00 mg/dL   Calcium 14.3 (HH) 8.9 - 10.3 mg/dL   Total Protein 7.3 6.5 - 8.1 g/dL   Albumin 3.0 (L) 3.5 - 5.0 g/dL   AST 28 15 - 41 U/L   ALT 17 0 - 44 U/L   Alkaline Phosphatase 78 38 - 126 U/L   Total Bilirubin 0.8 0.3 - 1.2 mg/dL   GFR, Estimated >60 >60 mL/min   Anion gap 9 5 - 15  CBC     Status:  Abnormal   Collection Time: 06/18/22  6:02 AM  Result Value Ref Range   WBC 11.7 (H) 4.0 - 10.5 K/uL   RBC 5.05 3.87 - 5.11 MIL/uL   Hemoglobin 14.1 12.0 - 15.0 g/dL   HCT 41.9 36.0 - 46.0 %   MCV 83.0 80.0 - 100.0 fL   MCH 27.9 26.0 - 34.0 pg   MCHC 33.7 30.0 - 36.0 g/dL   RDW 15.1 11.5 - 15.5 %   Platelets 209 150 - 400 K/uL   nRBC 0.0 0.0 - 0.2 %  MRSA Next Gen by PCR, Nasal     Status: Abnormal   Collection Time: 06/18/22  6:10 AM   Specimen: Nasal Mucosa; Nasal Swab  Result Value Ref Range   MRSA by PCR Next Gen DETECTED (A) NOT DETECTED  Glucose, capillary     Status: Abnormal   Collection Time: 06/18/22  7:44 AM  Result Value Ref Range   Glucose-Capillary 162 (H) 70 - 99 mg/dL  Glucose, capillary     Status: Abnormal   Collection Time: 06/18/22 11:38 AM  Result Value Ref Range   Glucose-Capillary 151 (H) 70 - 99 mg/dL   *Note: Due to a large number of results and/or encounters for the requested time period, some results have not been displayed. A complete set of results can be found in Results Review.    I have Reviewed nursing notes, Vitals, and Lab results since pt's last encounter. Pertinent lab results : see above I have ordered test including BMP, CBC, Mg I have reviewed the last note from staff over past 24 hours I have discussed pt's care plan and test results with nursing staff, case manager   LOS: 1  day   Dwyane Dee, MD [facility] 06/18/2022, 11:53 AM

## 2022-06-18 NOTE — Assessment & Plan Note (Addendum)
-  Suspected etiology breast cancer recurrence with metastasis versus new second primary malignancy with mets versus less likely multiple myeloma - checking MM panel -Patient evaluated by oncology on 06/20/2022, see breast cancer

## 2022-06-18 NOTE — Assessment & Plan Note (Signed)
-   Hold home meds for now, will resume slowly as mentation hopefully improves

## 2022-06-18 NOTE — Assessment & Plan Note (Signed)
-   Hold statin 

## 2022-06-18 NOTE — Assessment & Plan Note (Signed)
-   no s/s exac

## 2022-06-18 NOTE — Hospital Course (Signed)
Ms. Rathel is a 51 yo female with PMH right-sided breast cancer (on anastrozole and Lupron), asthma, anxiety, CAD, diastolic CHF, CKD, COPD, DM II, HTN, HLD, seizures, CVA. She presented to the ER with altered mentation.  She was found to have hypercalcemia on admission (corrected calcium, 15.3 mg/dL).  CT chest/abdomen/pelvis on admission notable for lytic lesion involving right femoral neck consistent with bony metastatic disease.  Also noted soft tissue density at the right hilum concerning for possible neoplasm.  Underwent bronchoscopy with biopsy on 12/13.

## 2022-06-18 NOTE — Assessment & Plan Note (Signed)
-   UA noted with small LE, negative nitrite, 21-50 WBC, rare bacteria - Received Rocephin on admission.  Given hypercalcemia, will transition to cefepime - Follow-up urine culture; does not appear to have been sent - Will plan to complete 3 days short course in efforts to prevent any encephalopathy from cefepime as well

## 2022-06-18 NOTE — Assessment & Plan Note (Signed)
-   Hold aspirin and statin for now

## 2022-06-18 NOTE — Assessment & Plan Note (Signed)
-   No further recurrence since admission - Patient also not a good candidate for endoscopies at this time given hypercalcemia and encephalopathy - Evaluated by GI, appreciate assistance.  For now continuing Protonix and advancing diet.  No plans for invasive workup

## 2022-06-18 NOTE — Assessment & Plan Note (Signed)
-   Suspected multifactorial in setting of underlying severe hypercalcemia and possible UTI - Continuing treatment for both and will monitor mentation changes

## 2022-06-18 NOTE — Assessment & Plan Note (Signed)
-   no s/s exac - last echo 11/09/21: EF 60-65%, no RWMA, normal diastology

## 2022-06-18 NOTE — Assessment & Plan Note (Signed)
-   No meds noted on med rec - Trend BP and will treat as needed

## 2022-06-19 DIAGNOSIS — G9341 Metabolic encephalopathy: Secondary | ICD-10-CM | POA: Diagnosis not present

## 2022-06-19 DIAGNOSIS — C50511 Malignant neoplasm of lower-outer quadrant of right female breast: Secondary | ICD-10-CM | POA: Diagnosis not present

## 2022-06-19 DIAGNOSIS — N3000 Acute cystitis without hematuria: Secondary | ICD-10-CM | POA: Diagnosis not present

## 2022-06-19 LAB — COMPREHENSIVE METABOLIC PANEL
ALT: 16 U/L (ref 0–44)
AST: 26 U/L (ref 15–41)
Albumin: 2.8 g/dL — ABNORMAL LOW (ref 3.5–5.0)
Alkaline Phosphatase: 74 U/L (ref 38–126)
Anion gap: 6 (ref 5–15)
BUN: 17 mg/dL (ref 6–20)
CO2: 21 mmol/L — ABNORMAL LOW (ref 22–32)
Calcium: 11.4 mg/dL — ABNORMAL HIGH (ref 8.9–10.3)
Chloride: 113 mmol/L — ABNORMAL HIGH (ref 98–111)
Creatinine, Ser: 1.05 mg/dL — ABNORMAL HIGH (ref 0.44–1.00)
GFR, Estimated: >60 mL/min (ref >60)
Glucose, Bld: 170 mg/dL — ABNORMAL HIGH (ref 70–99)
Potassium: 3.2 mmol/L — ABNORMAL LOW (ref 3.5–5.1)
Sodium: 140 mmol/L (ref 135–145)
Total Bilirubin: 0.9 mg/dL (ref 0.3–1.2)
Total Protein: 6.8 g/dL (ref 6.5–8.1)

## 2022-06-19 LAB — CBC WITH DIFFERENTIAL/PLATELET
Abs Immature Granulocytes: 0.07 10*3/uL (ref 0.00–0.07)
Basophils Absolute: 0.1 10*3/uL (ref 0.0–0.1)
Basophils Relative: 0 %
Eosinophils Absolute: 0.1 10*3/uL (ref 0.0–0.5)
Eosinophils Relative: 0 %
HCT: 38.9 % (ref 36.0–46.0)
Hemoglobin: 13.2 g/dL (ref 12.0–15.0)
Immature Granulocytes: 1 %
Lymphocytes Relative: 11 %
Lymphs Abs: 1.3 10*3/uL (ref 0.7–4.0)
MCH: 28.3 pg (ref 26.0–34.0)
MCHC: 33.9 g/dL (ref 30.0–36.0)
MCV: 83.3 fL (ref 80.0–100.0)
Monocytes Absolute: 0.7 10*3/uL (ref 0.1–1.0)
Monocytes Relative: 6 %
Neutro Abs: 10 10*3/uL — ABNORMAL HIGH (ref 1.7–7.7)
Neutrophils Relative %: 82 %
Platelets: 209 10*3/uL (ref 150–400)
RBC: 4.67 MIL/uL (ref 3.87–5.11)
RDW: 15 % (ref 11.5–15.5)
WBC: 12.2 10*3/uL — ABNORMAL HIGH (ref 4.0–10.5)
nRBC: 0 % (ref 0.0–0.2)

## 2022-06-19 LAB — MAGNESIUM: Magnesium: 1.5 mg/dL — ABNORMAL LOW (ref 1.7–2.4)

## 2022-06-19 LAB — PTH, INTACT AND CALCIUM
Calcium, Total (PTH): 14.7 mg/dL (ref 8.7–10.2)
PTH: 5 pg/mL — ABNORMAL LOW (ref 15–65)

## 2022-06-19 LAB — GLUCOSE, CAPILLARY
Glucose-Capillary: 183 mg/dL — ABNORMAL HIGH (ref 70–99)
Glucose-Capillary: 200 mg/dL — ABNORMAL HIGH (ref 70–99)
Glucose-Capillary: 164 mg/dL — ABNORMAL HIGH (ref 70–99)
Glucose-Capillary: 104 mg/dL — ABNORMAL HIGH (ref 70–99)

## 2022-06-19 MED ORDER — SODIUM CHLORIDE 0.9 % IV SOLN: INTRAVENOUS | Status: DC

## 2022-06-19 MED ORDER — POTASSIUM CHLORIDE CRYS ER 20 MEQ PO TBCR
40.0000 meq | EXTENDED_RELEASE_TABLET | Freq: Once | ORAL | Status: AC
  Administered 2022-06-19: 40 meq via ORAL
  Filled 2022-06-19: qty 2

## 2022-06-19 MED ORDER — VENLAFAXINE HCL ER 150 MG PO CP24
150.0000 mg | ORAL_CAPSULE | Freq: Every day | ORAL | Status: DC
  Administered 2022-06-20 – 2022-06-23 (×3): 150 mg via ORAL
  Filled 2022-06-19 (×3): qty 1

## 2022-06-19 MED ORDER — HYDRALAZINE HCL 25 MG PO TABS
25.0000 mg | ORAL_TABLET | ORAL | Status: DC | PRN
  Administered 2022-06-19 – 2022-06-21 (×3): 25 mg via ORAL
  Filled 2022-06-19 (×3): qty 1

## 2022-06-19 MED ORDER — TOPIRAMATE 25 MG PO TABS
50.0000 mg | ORAL_TABLET | Freq: Two times a day (BID) | ORAL | Status: DC
  Administered 2022-06-19 – 2022-06-20 (×3): 50 mg via ORAL
  Filled 2022-06-19 (×4): qty 2

## 2022-06-19 MED ORDER — MAGNESIUM SULFATE 2 GM/50ML IV SOLN
2.0000 g | Freq: Once | INTRAVENOUS | Status: AC
  Administered 2022-06-19: 2 g via INTRAVENOUS
  Filled 2022-06-19: qty 50

## 2022-06-19 MED ORDER — ALPRAZOLAM 0.5 MG PO TABS
1.0000 mg | ORAL_TABLET | Freq: Two times a day (BID) | ORAL | Status: DC | PRN
  Administered 2022-06-19 – 2022-06-22 (×3): 1 mg via ORAL
  Filled 2022-06-19 (×4): qty 2

## 2022-06-19 NOTE — Progress Notes (Signed)
Progress Note    [name]   MRN:[id number]  DOB: 03/26/1971  DOA: 06/17/2022     2 PCP: Clark, Katherine K, NP  Initial CC: AMS  Hospital Course: [name] is a 51 yo female with PMH right-sided breast cancer (on anastrozole and Lupron), asthma, anxiety, CAD, diastolic CHF, CKD, COPD, DM II, HTN, HLD, seizures, CVA. She presented to the ER with altered mentation.  Her husband supplied most of HPI and noted she had been nauseous with vomiting for approximately 3 to 4 days prior to admission.  She then began developing confusion and talking nonsense.  She also became incontinent of urine  She was found to have hypercalcemia on admission (corrected calcium, 15.3 mg/dL).  Urinalysis was notable for small LE, negative nitrite, 21-50 WBC, rare bacteria. WBC 10.8, 1% bands.  She has been undergoing outpatient evaluation with oncology regarding hypercalcemia. Recent bone scan on 05/26/2022 did show some radiotracer uptake in the left frontal bone and right greater trochanter. CT chest/abdomen/pelvis on admission notable for lytic lesion involving right femoral neck consistent with bony metastatic disease.  Also noted soft tissue density at the right hilum concerning for possible neoplasm.  Scattered pulmonary nodules with increase in size of right lower lobe pulmonary nodule noted. CT head unremarkable.  She was admitted for encephalopathy and treatment of hypercalcemia as well as presumed UTI.  Interval History:  Was able to talk with son yesterday afternoon. No events overnight.  Mentation has improved some since yesterday and she is more awake and alert able to answer a few more questions and follow commands.  Assessment and Plan: * Hypercalcemia - wide differential: malignancy highest suspicion (Br cancer recurrence with bone mets vs MM) vs med side effect of anastrazole (lower chance?) - Patient presumed to be symptomatic at this time with acute encephalopathy along with  generalized abdominal pains and N/V on admission - cCa on admission 15.3 mg/dL; she was given IVF and zometa -Started on calcitonin on 06/18/2022 and has had good response with improvement in corrected calcium level along with some improvement in mentation this morning - Continue calcitonin x 4 doses - s/p lasix x 1 on 06/18/22 as well - previously low suspicion for malignancy per oncology at last OV on 05/13/22, but since visit has had bone scan on 05/26/22 and CT chest 05/25/22; concern now for potential recurrence and she was scheduled for PET scan on 12/27; current workup still supports recurrence with CT's on admission - will further discuss with oncology regarding next steps while hospitalized   UTI (urinary tract infection) - UA noted with small LE, negative nitrite, 21-50 WBC, rare bacteria - Received Rocephin on admission.  Given hypercalcemia, will transition to cefepime - Follow-up urine culture; does not appear to have been sent - Will plan to complete 3 days short course in efforts to prevent any encephalopathy from cefepime as well  Acute metabolic encephalopathy - Suspected multifactorial in setting of underlying severe hypercalcemia and possible UTI - Continuing treatment for both and will monitor mentation changes  Carcinoma of lower-outer quadrant of right breast in female, estrogen receptor positive (HCC) - follows with Dr. Brahmanday; last OV 05/11/22 - ER positive - diagnosed April 2021 s/p masectomy and treated with tamoxifen (then stopped) and anastrazole and lupron  - possible that anastrazole contributing to hypercalcemia as well? - check Ca 15-3 and Ca 27.29  Lytic bone lesion of right femur - Suspected etiology breast cancer recurrence with metastasis versus new second primary malignancy with mets   versus less likely multiple myeloma - checking MM panel - will discuss with oncology on Monday regarding where preferred biopsy is (lung/hilum vs  bone)  Hematemesis-resolved as of 06/18/2022 - No further recurrence since admission - Patient also not a good candidate for endoscopies at this time given hypercalcemia and encephalopathy - Evaluated by GI, appreciate assistance.  For now continuing Protonix and advancing diet.  No plans for invasive workup  DM2 (diabetes mellitus, type 2) (HCC) - Continue SSI and CBG monitoring -Last A1c 10.3% on 03/04/2022  HTN (hypertension) - No meds noted on med rec - Trend BP and will treat as needed  Hyperlipidemia - Hold statin  Coronary artery disease involving native heart without angina pectoris - Hold aspirin and statin for now  Chronic heart failure with preserved ejection fraction (HFpEF) ([place]) - no s/s exac - last echo 11/09/21: EF 60-65%, no RWMA, normal diastology  Bipolar disorder ([place]) - Hold home meds for now, will resume slowly as mentation hopefully improves  COPD with chronic bronchitis - no s/s exac   Old records reviewed in assessment of this patient  Antimicrobials: Rocephin 12/8 >> 12/9 Cefepime 12/9 >> current   DVT prophylaxis:  enoxaparin (LOVENOX) injection 40 mg Start: 06/18/22 1300  Code Status:   Code Status: DNR  Mobility Assessment (last 72 hours)     Mobility Assessment     Row Name 06/18/22 1940 06/18/22 0800 06/18/22 0600       Does patient have an order for bedrest or is patient medically unstable Yes- Bedfast (Level 1) - Complete -- Yes- Bedfast (Level 1) - Complete     What is the highest level of mobility based on the progressive mobility assessment? Level 1 (Bedfast) - Unable to balance while sitting on edge of bed Level 1 (Bedfast) - Unable to balance while sitting on edge of bed Level 1 (Bedfast) - Unable to balance while sitting on edge of bed     Is the above level different from baseline mobility prior to current illness? -- Yes - Recommend PT order Yes - Recommend PT order              Barriers to discharge:  Disposition Plan:   Home 2-3 days Status is: Inpt  Objective: Blood pressure (!) 179/76, pulse 88, temperature 98.4 F (36.9 C), temperature source Oral, resp. rate 16, height 4' 11" (1.499 m), weight 71.1 kg, SpO2 98 %.  Examination:  Physical Exam Constitutional:      Comments: Still confused but slightly improved compared to yesterday.  More awake and alert.  HENT:     Head: Normocephalic and atraumatic.     Mouth/Throat:     Mouth: Mucous membranes are moist.  Eyes:     Extraocular Movements: Extraocular movements intact.  Cardiovascular:     Rate and Rhythm: Normal rate and regular rhythm.  Pulmonary:     Effort: Pulmonary effort is normal.     Breath sounds: Normal breath sounds.  Abdominal:     General: Bowel sounds are normal. There is no distension.     Palpations: Abdomen is soft.     Comments: Improved tenderness, now essentially minimal  Musculoskeletal:        General: Normal range of motion.     Cervical back: Normal range of motion and neck supple.  Skin:    General: Skin is warm.  Neurological:     Comments: Able to tell me her name today which she could not yesterday.  Follows  more commands also.  Still has obvious underlying confusion however      Consultants:    Procedures:    Data Reviewed: Results for orders placed or performed during the hospital encounter of 06/17/22 (from the past 24 hour(s))  Glucose, capillary     Status: Abnormal   Collection Time: 06/18/22  4:45 PM  Result Value Ref Range   Glucose-Capillary 199 (H) 70 - 99 mg/dL  Glucose, capillary     Status: Abnormal   Collection Time: 06/18/22  9:25 PM  Result Value Ref Range   Glucose-Capillary 172 (H) 70 - 99 mg/dL  CBC with Differential/Platelet     Status: Abnormal   Collection Time: 06/19/22  5:02 AM  Result Value Ref Range   WBC 12.2 (H) 4.0 - 10.5 K/uL   RBC 4.67 3.87 - 5.11 MIL/uL   Hemoglobin 13.2 12.0 - 15.0 g/dL   HCT 38.9 36.0 - 46.0 %   MCV 83.3 80.0 - 100.0 fL   MCH 28.3 26.0 -  34.0 pg   MCHC 33.9 30.0 - 36.0 g/dL   RDW 15.0 11.5 - 15.5 %   Platelets 209 150 - 400 K/uL   nRBC 0.0 0.0 - 0.2 %   Neutrophils Relative % 82 %   Neutro Abs 10.0 (H) 1.7 - 7.7 K/uL   Lymphocytes Relative 11 %   Lymphs Abs 1.3 0.7 - 4.0 K/uL   Monocytes Relative 6 %   Monocytes Absolute 0.7 0.1 - 1.0 K/uL   Eosinophils Relative 0 %   Eosinophils Absolute 0.1 0.0 - 0.5 K/uL   Basophils Relative 0 %   Basophils Absolute 0.1 0.0 - 0.1 K/uL   Immature Granulocytes 1 %   Abs Immature Granulocytes 0.07 0.00 - 0.07 K/uL  Comprehensive metabolic panel     Status: Abnormal   Collection Time: 06/19/22  5:02 AM  Result Value Ref Range   Sodium 140 135 - 145 mmol/L   Potassium 3.2 (L) 3.5 - 5.1 mmol/L   Chloride 113 (H) 98 - 111 mmol/L   CO2 21 (L) 22 - 32 mmol/L   Glucose, Bld 170 (H) 70 - 99 mg/dL   BUN 17 6 - 20 mg/dL   Creatinine, Ser 1.05 (H) 0.44 - 1.00 mg/dL   Calcium 11.4 (H) 8.9 - 10.3 mg/dL   Total Protein 6.8 6.5 - 8.1 g/dL   Albumin 2.8 (L) 3.5 - 5.0 g/dL   AST 26 15 - 41 U/L   ALT 16 0 - 44 U/L   Alkaline Phosphatase 74 38 - 126 U/L   Total Bilirubin 0.9 0.3 - 1.2 mg/dL   GFR, Estimated >60 >60 mL/min   Anion gap 6 5 - 15  Magnesium     Status: Abnormal   Collection Time: 06/19/22  5:02 AM  Result Value Ref Range   Magnesium 1.5 (L) 1.7 - 2.4 mg/dL  Glucose, capillary     Status: Abnormal   Collection Time: 06/19/22  7:28 AM  Result Value Ref Range   Glucose-Capillary 183 (H) 70 - 99 mg/dL  Glucose, capillary     Status: Abnormal   Collection Time: 06/19/22 11:14 AM  Result Value Ref Range   Glucose-Capillary 200 (H) 70 - 99 mg/dL   *Note: Due to a large number of results and/or encounters for the requested time period, some results have not been displayed. A complete set of results can be found in Results Review.    I have Reviewed nursing notes, Vitals, and Lab  results since pt's last encounter. Pertinent lab results : see above I have ordered test including  BMP, CBC, Mg I have reviewed the last note from staff over past 24 hours I have discussed pt's care plan and test results with nursing staff, case manager  Time spent: Greater than 50% of the 55 minute visit was spent in counseling/coordination of care for the patient as laid out in the A&P.    LOS: 2 days    , MD [facility] 06/19/2022, 12:17 PM 

## 2022-06-20 LAB — CBC WITH DIFFERENTIAL/PLATELET
Abs Immature Granulocytes: 0.08 10*3/uL — ABNORMAL HIGH (ref 0.00–0.07)
Basophils Absolute: 0.1 10*3/uL (ref 0.0–0.1)
Basophils Relative: 1 %
Eosinophils Absolute: 0.2 10*3/uL (ref 0.0–0.5)
Eosinophils Relative: 2 %
HCT: 36.5 % (ref 36.0–46.0)
Hemoglobin: 12.6 g/dL (ref 12.0–15.0)
Immature Granulocytes: 1 %
Lymphocytes Relative: 12 %
Lymphs Abs: 1.4 10*3/uL (ref 0.7–4.0)
MCH: 28.4 pg (ref 26.0–34.0)
MCHC: 34.5 g/dL (ref 30.0–36.0)
MCV: 82.2 fL (ref 80.0–100.0)
Monocytes Absolute: 0.9 10*3/uL (ref 0.1–1.0)
Monocytes Relative: 7 %
Neutro Abs: 9.7 10*3/uL — ABNORMAL HIGH (ref 1.7–7.7)
Neutrophils Relative %: 77 %
Platelets: 191 10*3/uL (ref 150–400)
RBC: 4.44 MIL/uL (ref 3.87–5.11)
RDW: 14.6 % (ref 11.5–15.5)
WBC: 12.3 10*3/uL — ABNORMAL HIGH (ref 4.0–10.5)
nRBC: 0 % (ref 0.0–0.2)

## 2022-06-20 LAB — COMPREHENSIVE METABOLIC PANEL
ALT: 15 U/L (ref 0–44)
AST: 22 U/L (ref 15–41)
Albumin: 2.7 g/dL — ABNORMAL LOW (ref 3.5–5.0)
Alkaline Phosphatase: 71 U/L (ref 38–126)
Anion gap: 8 (ref 5–15)
BUN: 16 mg/dL (ref 6–20)
CO2: 17 mmol/L — ABNORMAL LOW (ref 22–32)
Calcium: 10.2 mg/dL (ref 8.9–10.3)
Chloride: 113 mmol/L — ABNORMAL HIGH (ref 98–111)
Creatinine, Ser: 0.85 mg/dL (ref 0.44–1.00)
GFR, Estimated: >60 mL/min (ref >60)
Glucose, Bld: 119 mg/dL — ABNORMAL HIGH (ref 70–99)
Potassium: 2.9 mmol/L — ABNORMAL LOW (ref 3.5–5.1)
Sodium: 138 mmol/L (ref 135–145)
Total Bilirubin: 1 mg/dL (ref 0.3–1.2)
Total Protein: 6.4 g/dL — ABNORMAL LOW (ref 6.5–8.1)

## 2022-06-20 LAB — GLUCOSE, CAPILLARY
Glucose-Capillary: 115 mg/dL — ABNORMAL HIGH (ref 70–99)
Glucose-Capillary: 147 mg/dL — ABNORMAL HIGH (ref 70–99)
Glucose-Capillary: 138 mg/dL — ABNORMAL HIGH (ref 70–99)

## 2022-06-20 LAB — MAGNESIUM: Magnesium: 2.1 mg/dL (ref 1.7–2.4)

## 2022-06-20 IMAGING — MR MR HEEL *L* W/O CM
7 series · 40 of 40 positions shown · non-contrast
Comparison: Radiographs 09/20/2021

CLINICAL DATA: Foot swelling, diabetes, possible ulcer

EXAM:
MR OF THE LEFT HEEL WITHOUT CONTRAST
TECHNIQUE: Multiplanar, multisequence MR imaging of the ankle was performed. No
intravenous contrast was administered.

[Series 3: T1 · coronal · left · 3.0mm · 0.41mm/px · 7 of 49 slices shown (1 of 2)]
[im 1/49]
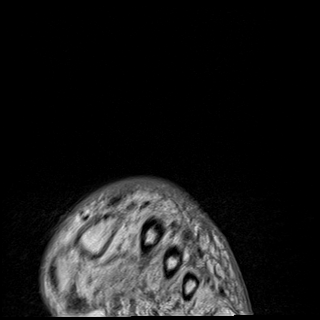
[im 9/49]
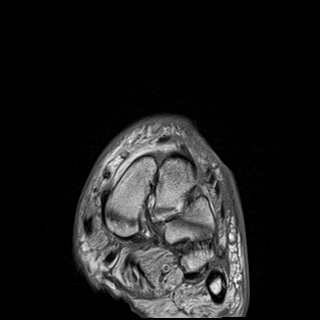
[im 17/49]
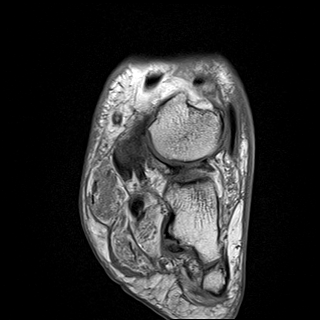
[im 25/49]
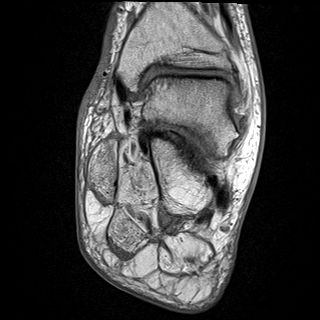
[im 33/49]
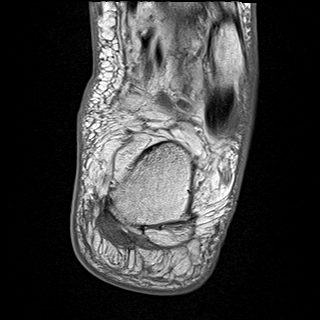
[im 41/49]
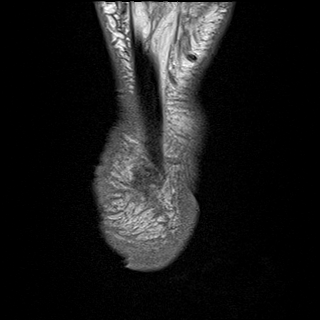
[im 49/49]
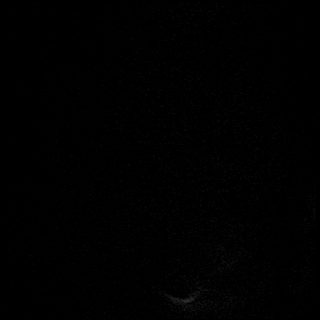

[Series 6: T1 · axial · left · 3.0mm · 0.70mm/px · z∈[-88,+18]mm · 5 of 31 slices shown (2 of 2)]
[im 1/31]
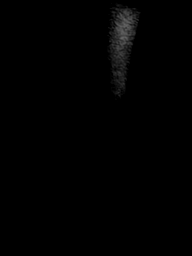
[im 8/31]
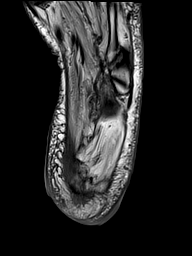
[im 16/31]
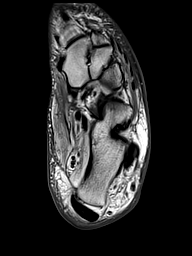
[im 23/31]
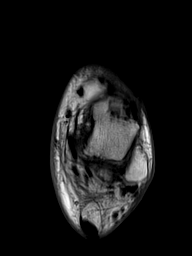
[im 31/31]
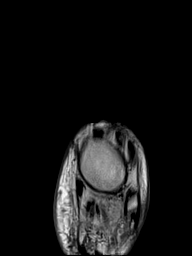

[Series 7: T2 · coronal · left · 3.0mm · 0.50mm/px · 7 of 49 slices shown (1 of 4)]
[im 1/49]
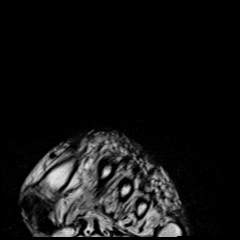
[im 9/49]
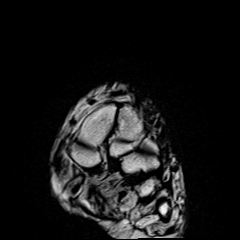
[im 17/49]
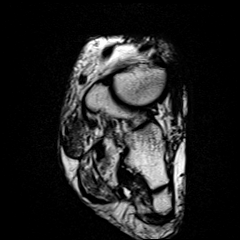
[im 25/49]
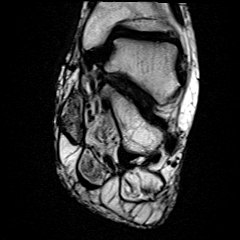
[im 33/49]
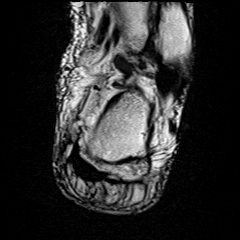
[im 41/49]
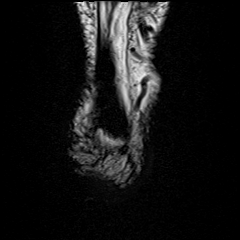
[im 49/49]
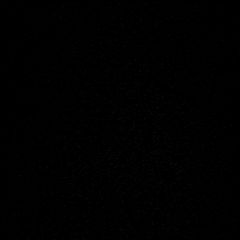

[Series 8: T2 · coronal · left · 3.0mm · 0.50mm/px · 7 of 49 slices shown (2 of 4)]
[im 1/49]
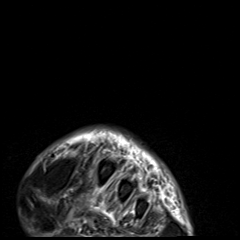
[im 9/49]
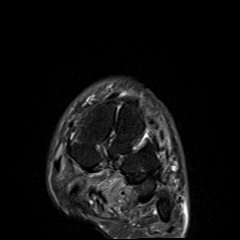
[im 17/49]
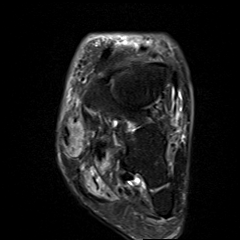
[im 25/49]
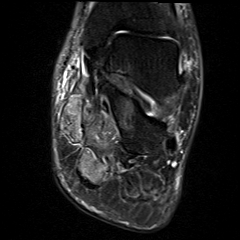
[im 33/49]
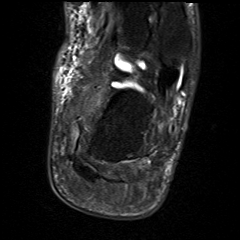
[im 41/49]
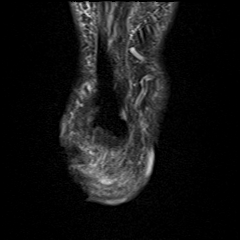
[im 49/49]
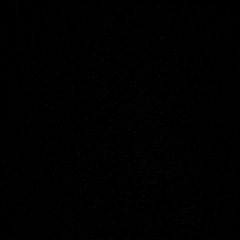

[Series 9: T2 · axial · left · 3.0mm · 0.70mm/px · z∈[-88,+18]mm · 5 of 31 slices shown (3 of 4)]
[im 1/31]
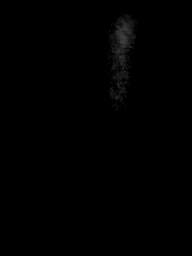
[im 8/31]
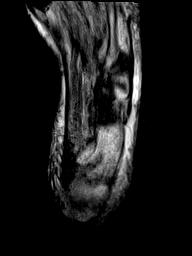
[im 16/31]
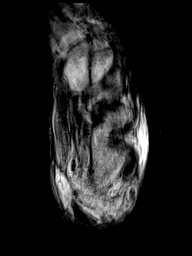
[im 23/31]
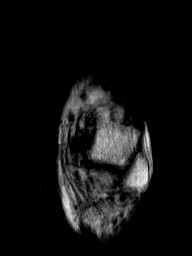
[im 31/31]
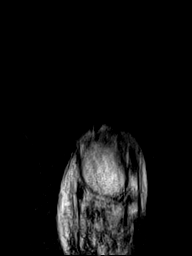

[Series 10: T2 · axial · left · 3.0mm · 0.70mm/px · z∈[-88,+18]mm · 5 of 31 slices shown (4 of 4)]
[im 1/31]
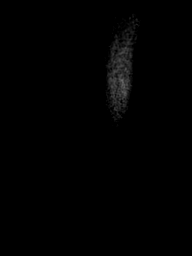
[im 8/31]
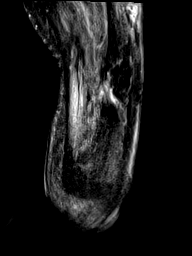
[im 16/31]
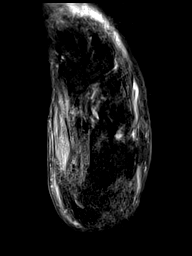
[im 23/31]
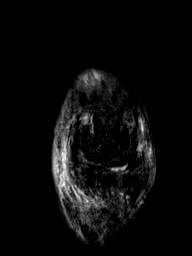
[im 31/31]
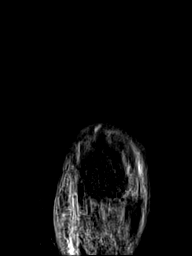

[Series 11: STIR · sagittal · left · 3.0mm · 0.62mm/px · 4 of 27 slices shown]
[im 1/27]
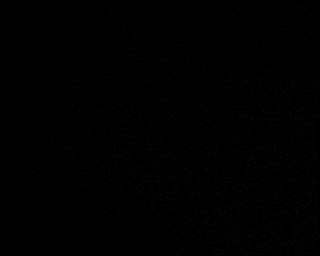
[im 9/27]
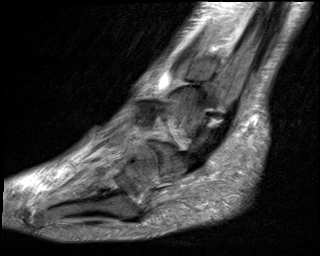
[im 18/27]
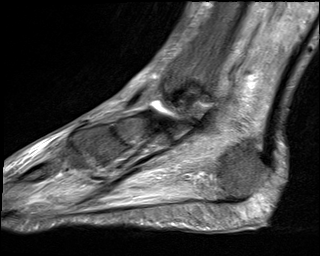
[im 27/27]
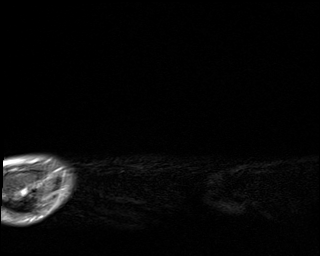

[40 of 40 positions shown; findings below may reference images not displayed]

FINDINGS: Despite efforts by the technologist and patient, motion artifact is
present on today's exam and could not be eliminated. This reduces
exam sensitivity and specificity.

TENDONS

Peroneal: Unremarkable

Posteromedial: Distal tibialis posterior tendinopathy.

Anterior: Extensor digitorum longus tenosynovitis as on image 7
series 10.

Achilles: Grossly intact

Plantar Fascia: Mildly thickened medial band of the plantar fascia
proximally potentially reflecting mild plantar fasciitis.

LIGAMENTS

Lateral: Indeterminate due to motion artifact.

Medial: Indeterminate due to motion artifact.

CARTILAGE

Ankle Joint: Mild chondral thinning, no focal osteochondral lesion
identified.

Subtalar Joints/Sinus Tarsi: Spurring along the posterior subtalar
joint with low-level subcortical marrow edema which is likely
degenerative. Low-level edema in the sinus tarsi.

Bones: No substantial marrow edema is identified to indicate active
osteomyelitis.

Other: Posteroinferior heel ulceration as shown for example on image
27 of series 6, with local subcutaneous edema but no underlying bony
abnormality. No drainable abscess identified.
IMPRESSION: 1. Posteroinferior heel ulceration without drainable abscess or
osteomyelitis.
2. Tibialis posterior tendinopathy.
3. Extensor digitorum longus tenosynovitis.
4. Possible mild plantar fasciitis.
5. Mild chondral thinning in the tibiotalar joint along with
degenerative findings in the subtalar joints.
6. Despite efforts by the technologist and patient, motion artifact
is present on today's exam and could not be eliminated. This reduces
exam sensitivity and specificity.

## 2022-06-20 IMAGING — US US EXTREM LOW VENOUS*L*
1 series · 14 of 24 positions shown · non-contrast
Comparison: None.

CLINICAL DATA: Left lower extremity swelling

EXAM:
LEFT LOWER EXTREMITY VENOUS DOPPLER ULTRASOUND
TECHNIQUE: Gray-scale sonography with compression, as well as color and duplex
ultrasound, were performed to evaluate the deep venous system(s)
from the level of the common femoral vein through the popliteal and
proximal calf veins.

[Series 1: us venous img lower uni left (dvt) · portal-venous · 14 of 31 slices shown]
[im 1/31]
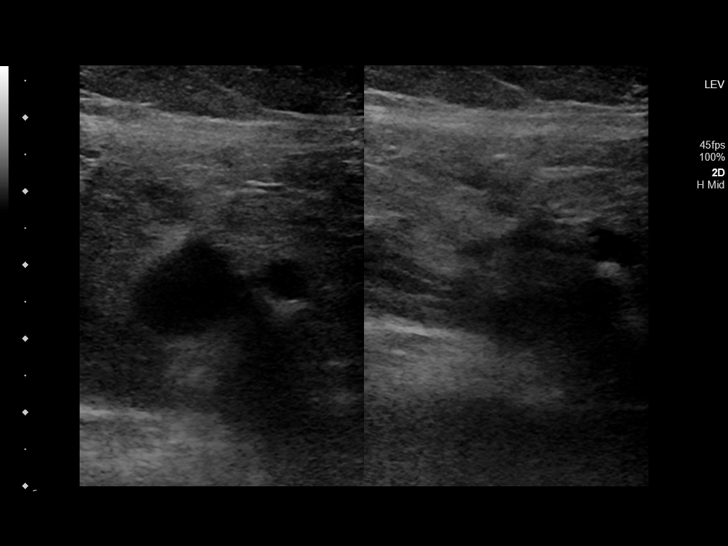
[im 3/31]
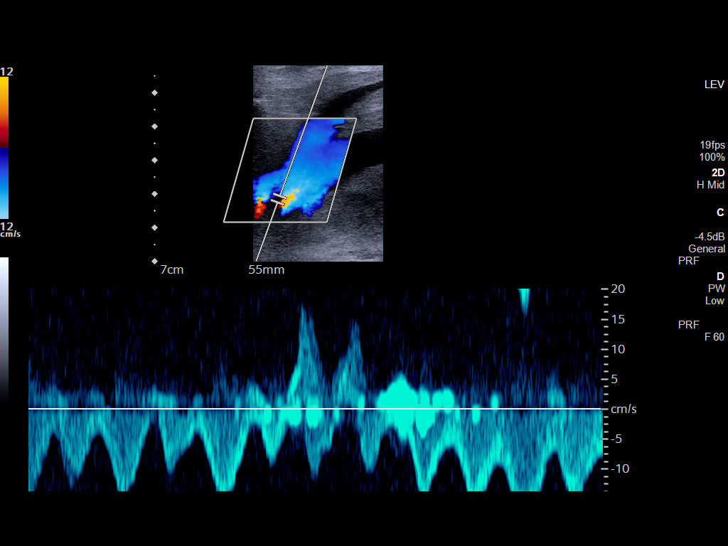
[im 6/31]
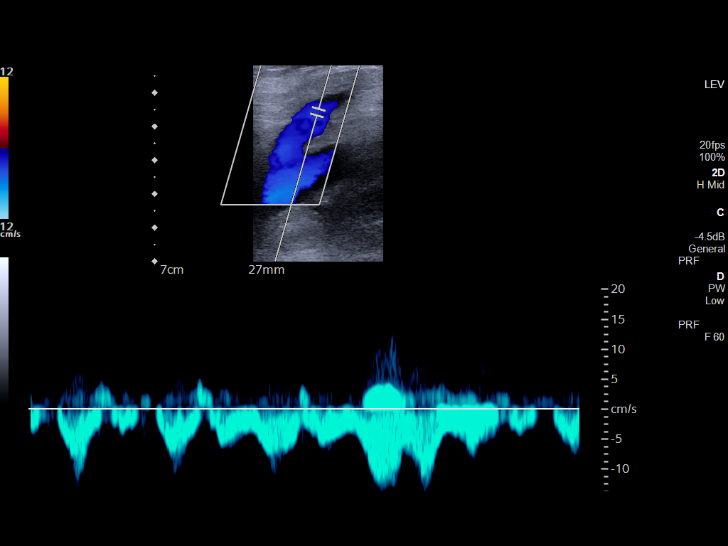
[im 8/31]
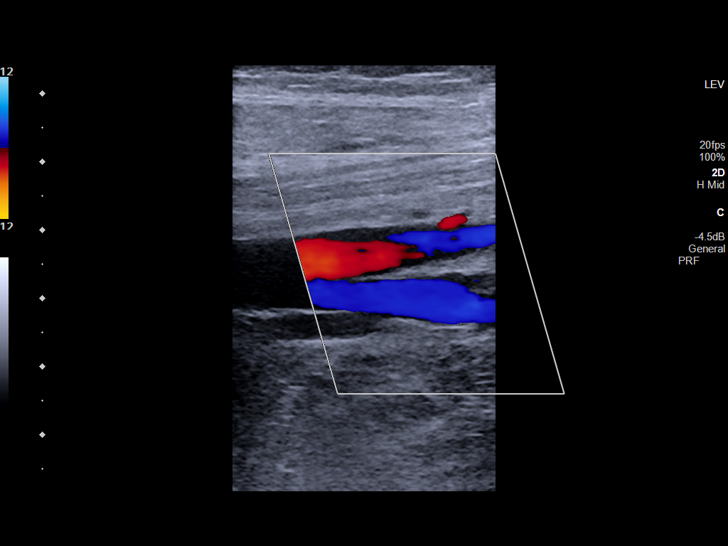
[im 10/31]
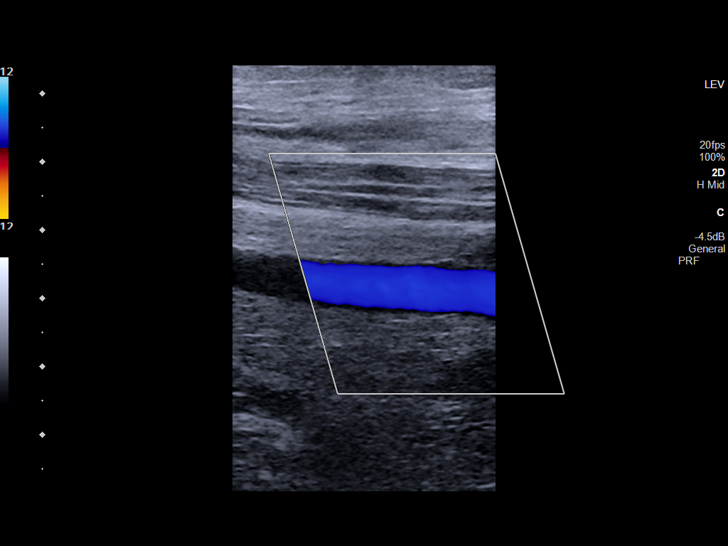
[im 12/31]
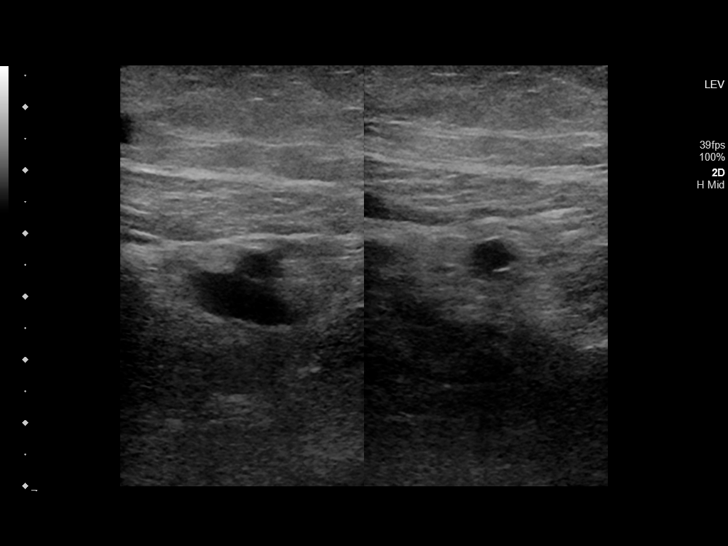
[im 15/31]
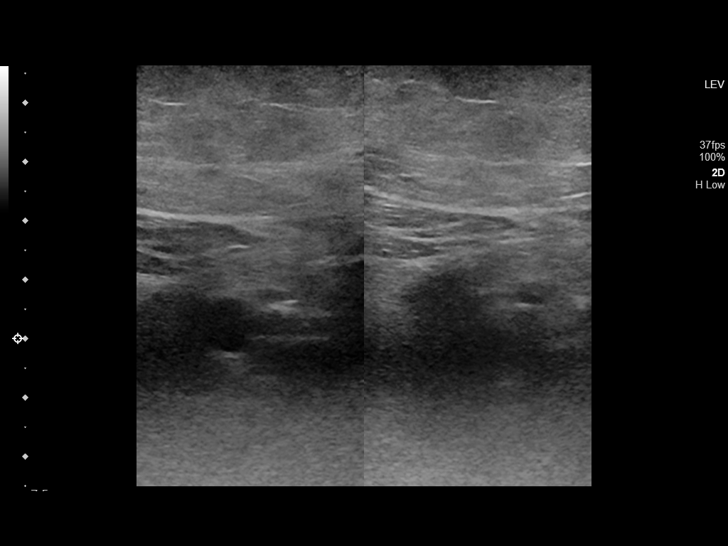
[im 16/31]
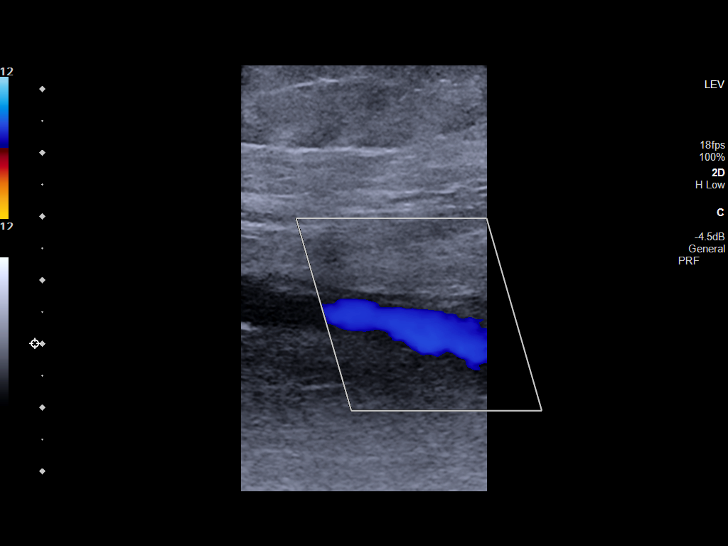
[im 19/31]
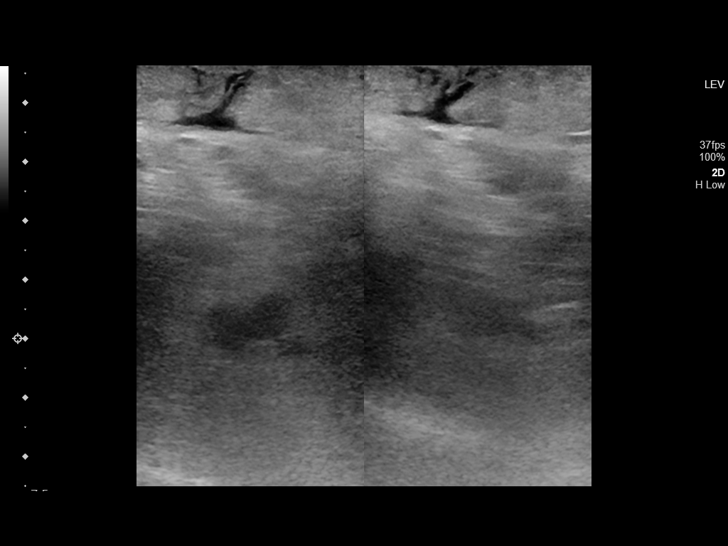
[im 21/31]
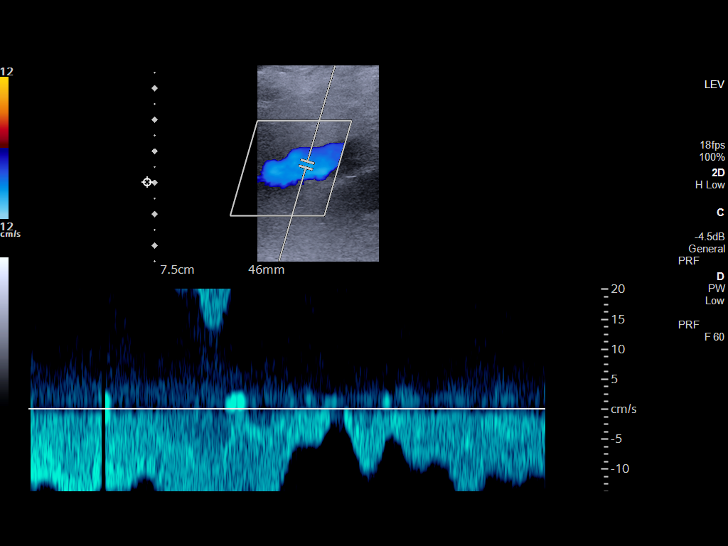
[im 24/31]
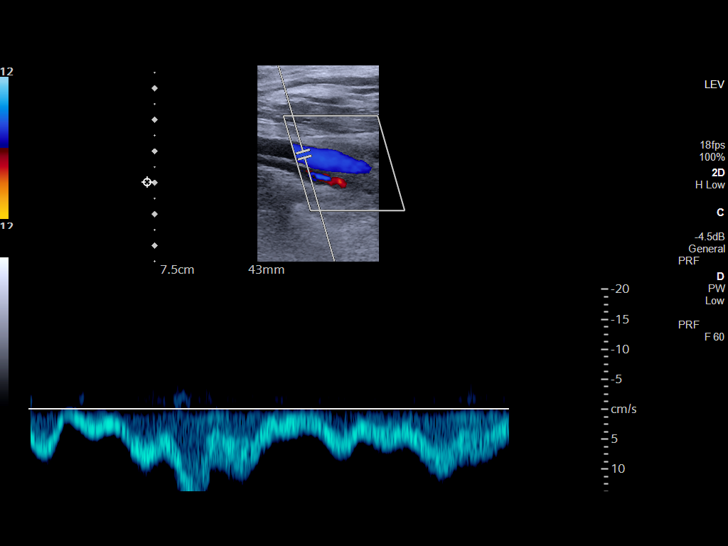
[im 25/31]
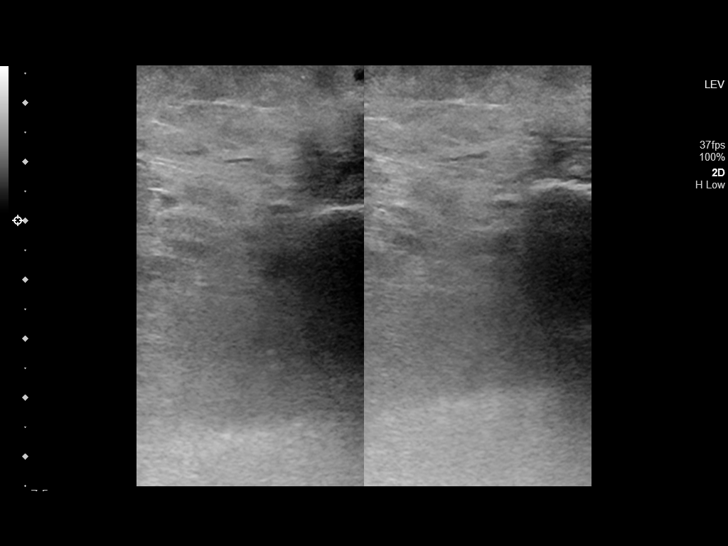
[im 28/31]
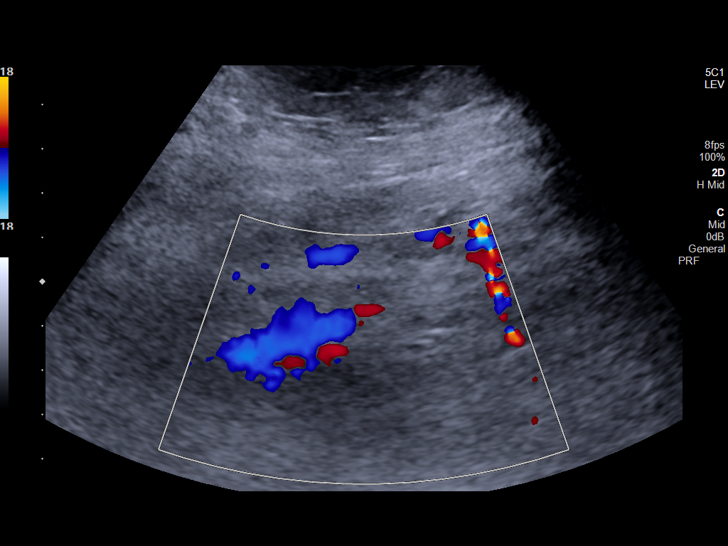
[im 31/31]
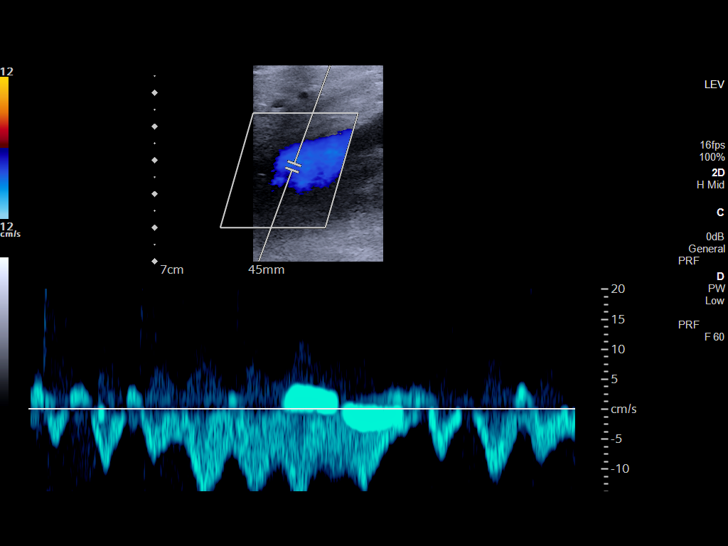

[14 of 24 positions shown; findings below may reference images not displayed]

FINDINGS: VENOUS

Normal compressibility of the common femoral, superficial femoral,
and popliteal veins, as well as the visualized calf veins.
Visualized portions of profunda femoral vein and great saphenous
vein unremarkable. No filling defects to suggest DVT on grayscale or
color Doppler imaging. Doppler waveforms show normal direction of
venous flow, normal respiratory plasticity and response to
augmentation.

Limited views of the contralateral common femoral vein are
unremarkable.

OTHER

Soft tissue edema of the calf.

Limitations: none
IMPRESSION: 1. Negative examination for deep venous thrombosis in the left lower
extremity.

2.  Soft tissue edema of the left calf.

## 2022-06-20 IMAGING — MR MR FOOT*L* W/O CM
8 series · 40 of 40 positions shown · non-contrast
Comparison: 09/20/2021 radiographs

CLINICAL DATA: Foot swelling and diabetes.

EXAM:
MRI OF THE LEFT FOREFOOT WITHOUT CONTRAST
TECHNIQUE: Multiplanar, multisequence MR imaging of the left forefoot was
performed. No intravenous contrast was administered.

[Series 3: T1 · coronal · left · 3.0mm · 0.38mm/px · 6 of 45 slices shown (1 of 3)]
[im 1/45]
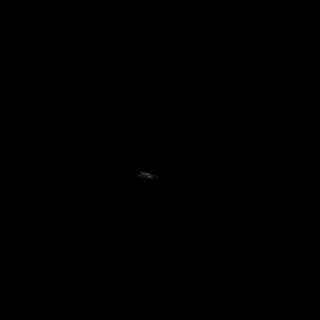
[im 9/45]
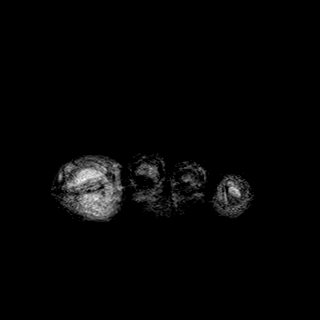
[im 18/45]
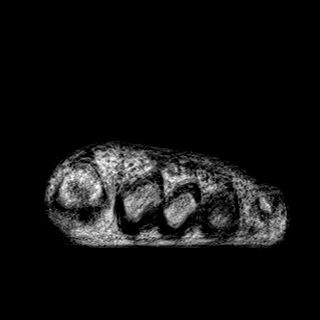
[im 27/45]
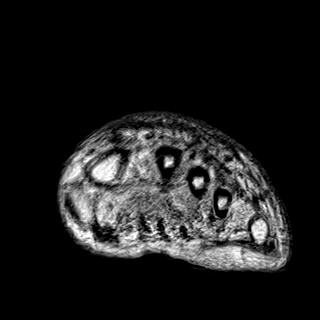
[im 36/45]
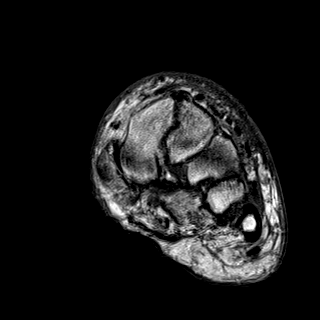
[im 45/45]
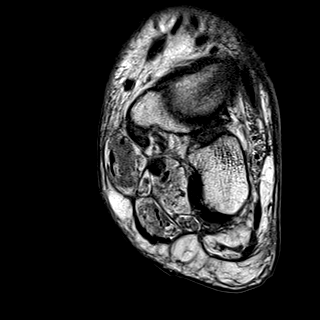

[Series 4: T2 · coronal · left · 3.0mm · 0.50mm/px · 6 of 45 slices shown (1 of 4)]
[im 1/45]
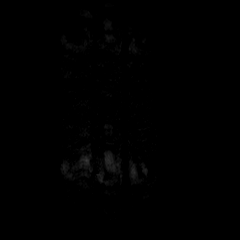
[im 9/45]
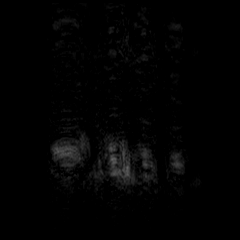
[im 18/45]
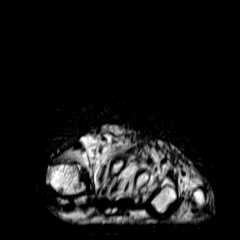
[im 27/45]
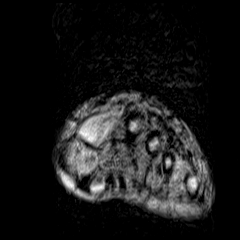
[im 36/45]
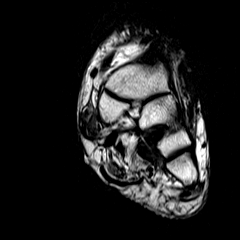
[im 45/45]
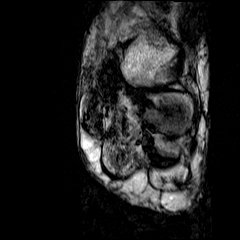

[Series 5: T2 · coronal · left · 3.0mm · 0.50mm/px · 7 of 45 slices shown (2 of 4)]
[im 1/45]
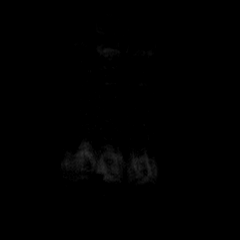
[im 8/45]
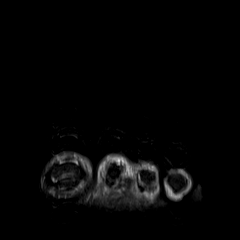
[im 15/45]
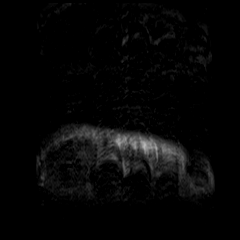
[im 23/45]
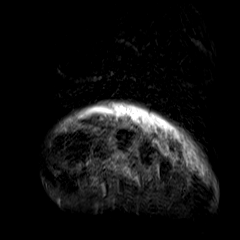
[im 30/45]
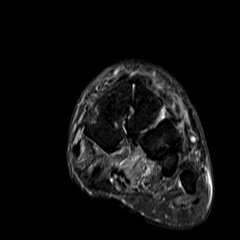
[im 37/45]
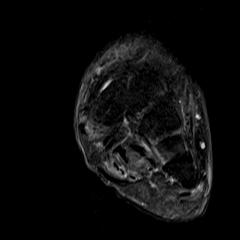
[im 45/45]
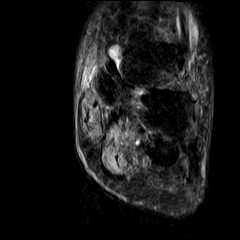

[Series 7: T1 · coronal · left · 3.0mm · 0.38mm/px · 7 of 45 slices shown (2 of 3)]
[im 1/45]
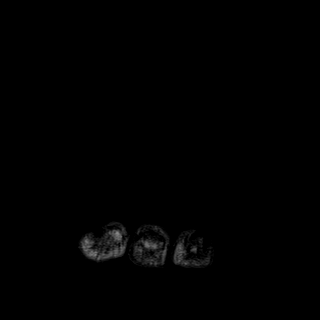
[im 8/45]
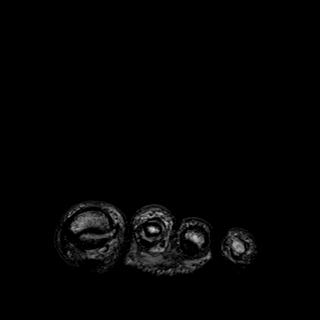
[im 15/45]
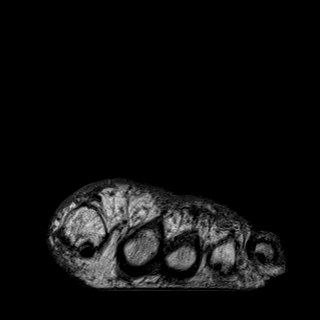
[im 23/45]
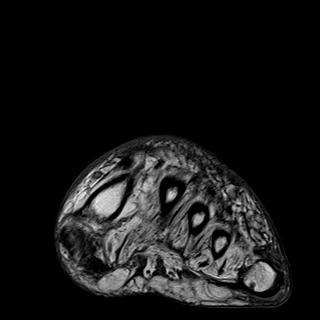
[im 30/45]
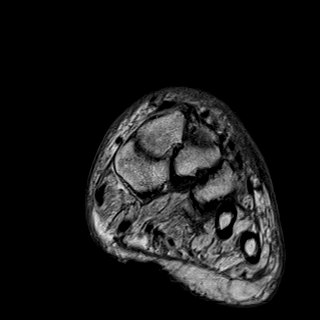
[im 37/45]
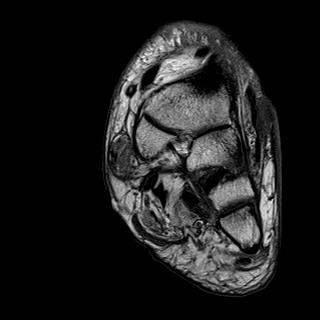
[im 45/45]
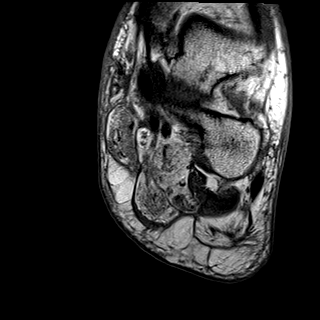

[Series 9: T2 · axial · left · 3.0mm · 0.70mm/px · z∈[-98,-29]mm · 3 of 19 slices shown (3 of 4)]
[im 1/19]
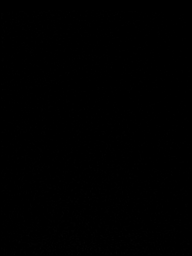
[im 10/19]
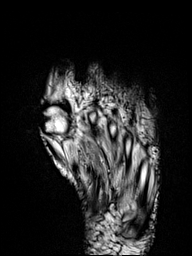
[im 19/19]
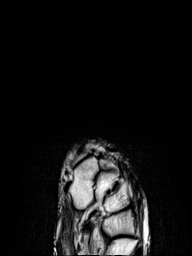

[Series 10: T2 · axial · left · 3.0mm · 0.70mm/px · z∈[-94,-29]mm · 3 of 18 slices shown (4 of 4)]
[im 1/18]
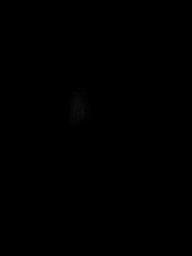
[im 9/18]
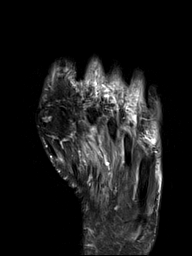
[im 18/18]
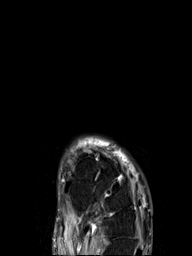

[Series 11: T1 · axial · left · 3.0mm · 0.70mm/px · z∈[-98,-29]mm · 3 of 19 slices shown (3 of 3)]
[im 1/19]
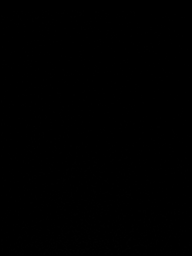
[im 10/19]
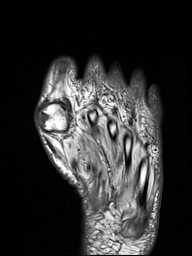
[im 19/19]
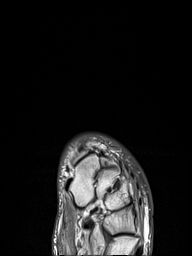

[Series 12: STIR · sagittal · left · 3.0mm · 0.62mm/px · 5 of 30 slices shown]
[im 1/30]
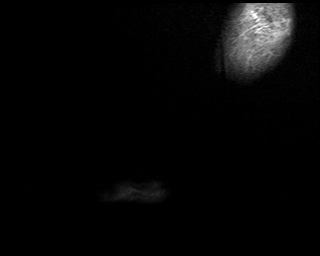
[im 8/30]
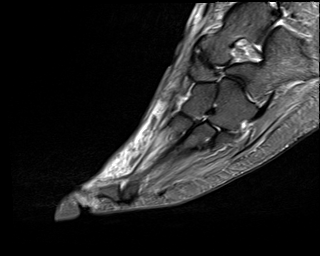
[im 15/30]
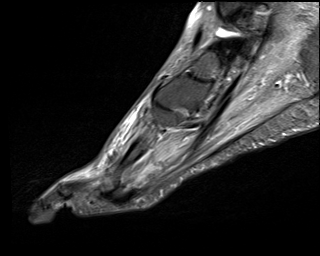
[im 22/30]
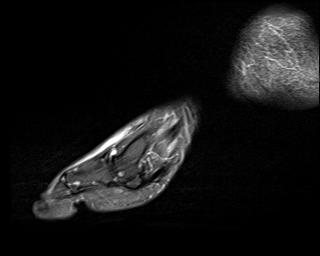
[im 30/30]
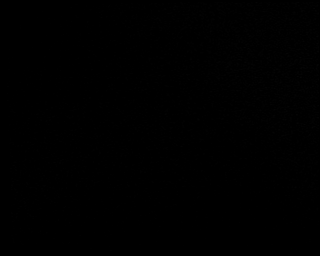

[40 of 40 positions shown; findings below may reference images not displayed]

FINDINGS: Despite efforts by the technologist and patient, motion artifact is
present on today's exam and could not be eliminated. This reduces
exam sensitivity and specificity.

Bones/Joint/Cartilage

Suspected erosions of the first metatarsal head, gout not excluded.
No substantial first MTP joint effusion to suggest a septic joint or
infection. No compelling findings of marrow edema in the phalanges
or remaining metatarsals to indicate osteomyelitis or fracture.
Lisfranc joint alignment normal.

Ligaments

The Lisfranc ligament appears grossly intact.

Muscles and Tendons

Regional muscular atrophy noted.

Soft tissues

Dorsal subcutaneous edema along the forefoot, cellulitis is not
excluded.
IMPRESSION: 1. No findings of osteomyelitis in the forefoot. However, there is
dorsal subcutaneous edema and cellulitis is not excluded.
2. Erosions of the first metatarsal head, potentially from gout or
rheumatoid arthritis.

## 2022-06-20 IMAGING — DX DG FOOT COMPLETE 3+V*L*
3 series · 3 of 3 positions shown · non-contrast
Comparison: 01/13/2021

CLINICAL DATA: Infection

Cellulitis
Possible abscess
Swelling
Warmth
EXAM:
LEFT FOOT - COMPLETE 3+ VIEW

[foot ap]
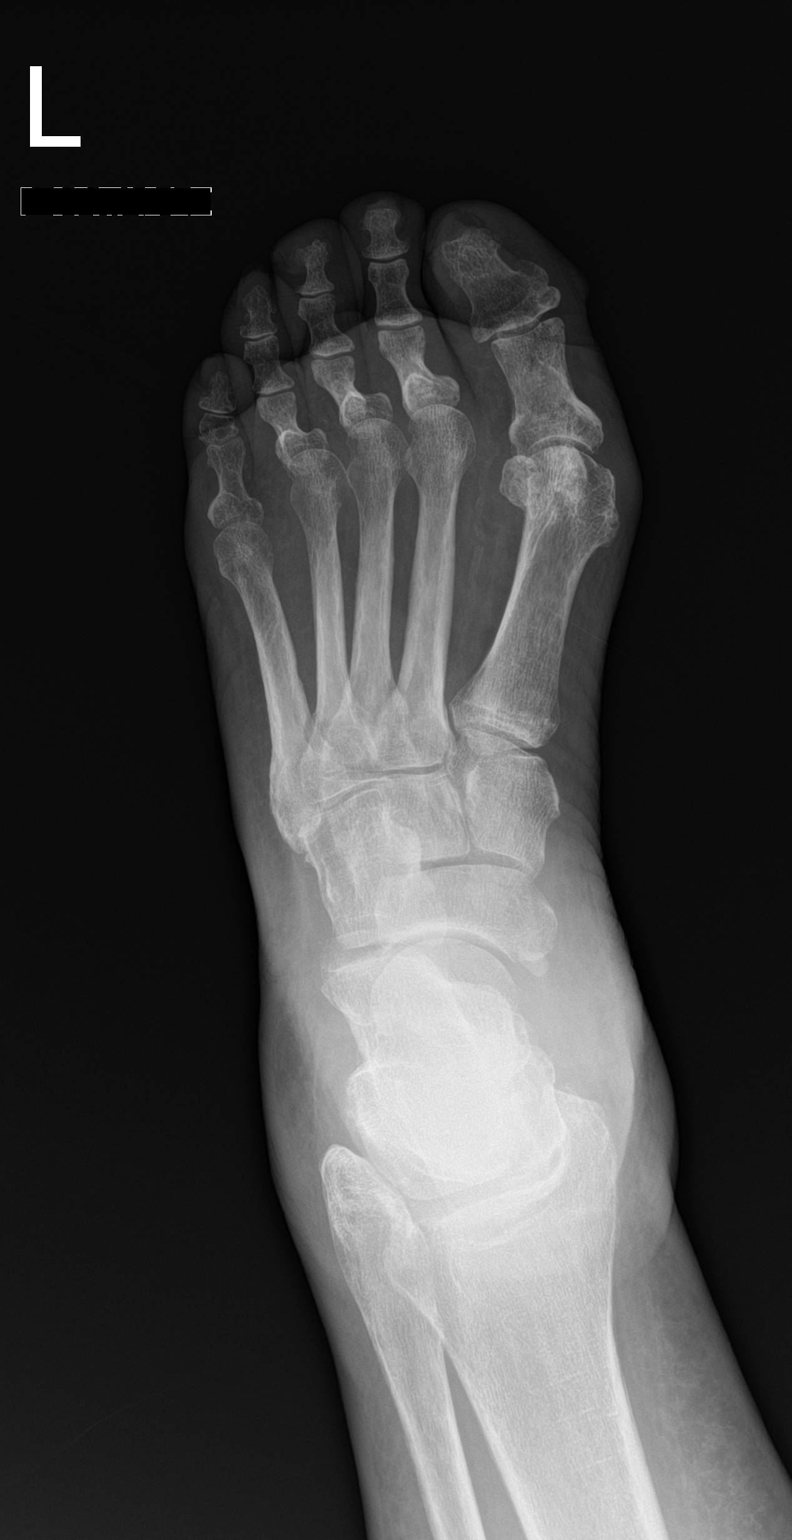

[foot obl]
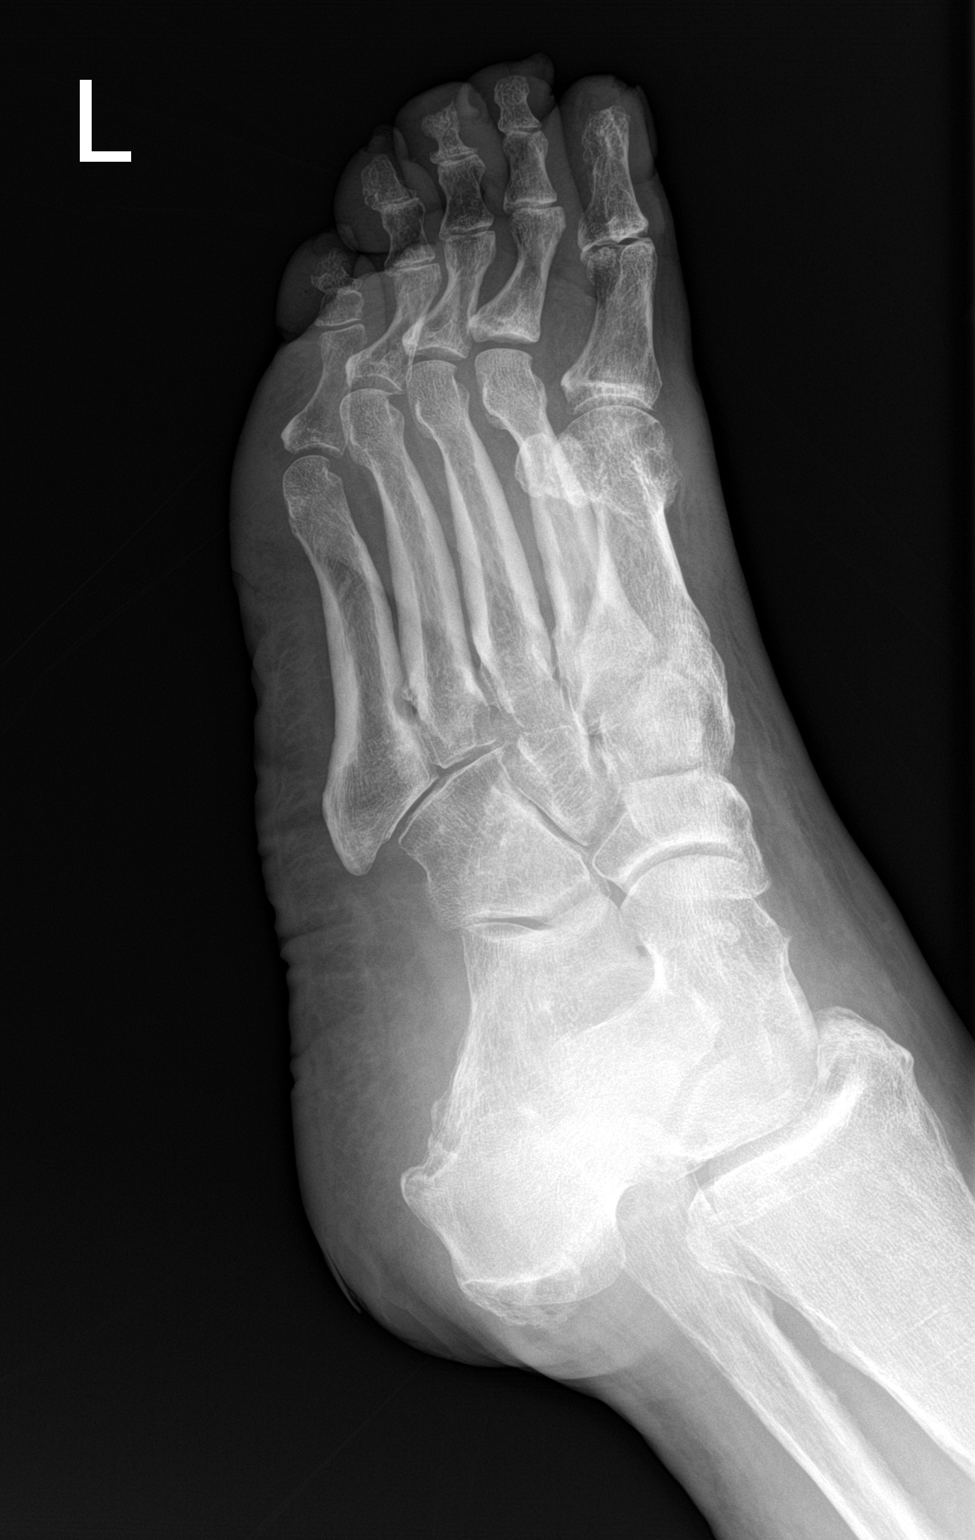

[foot lat]
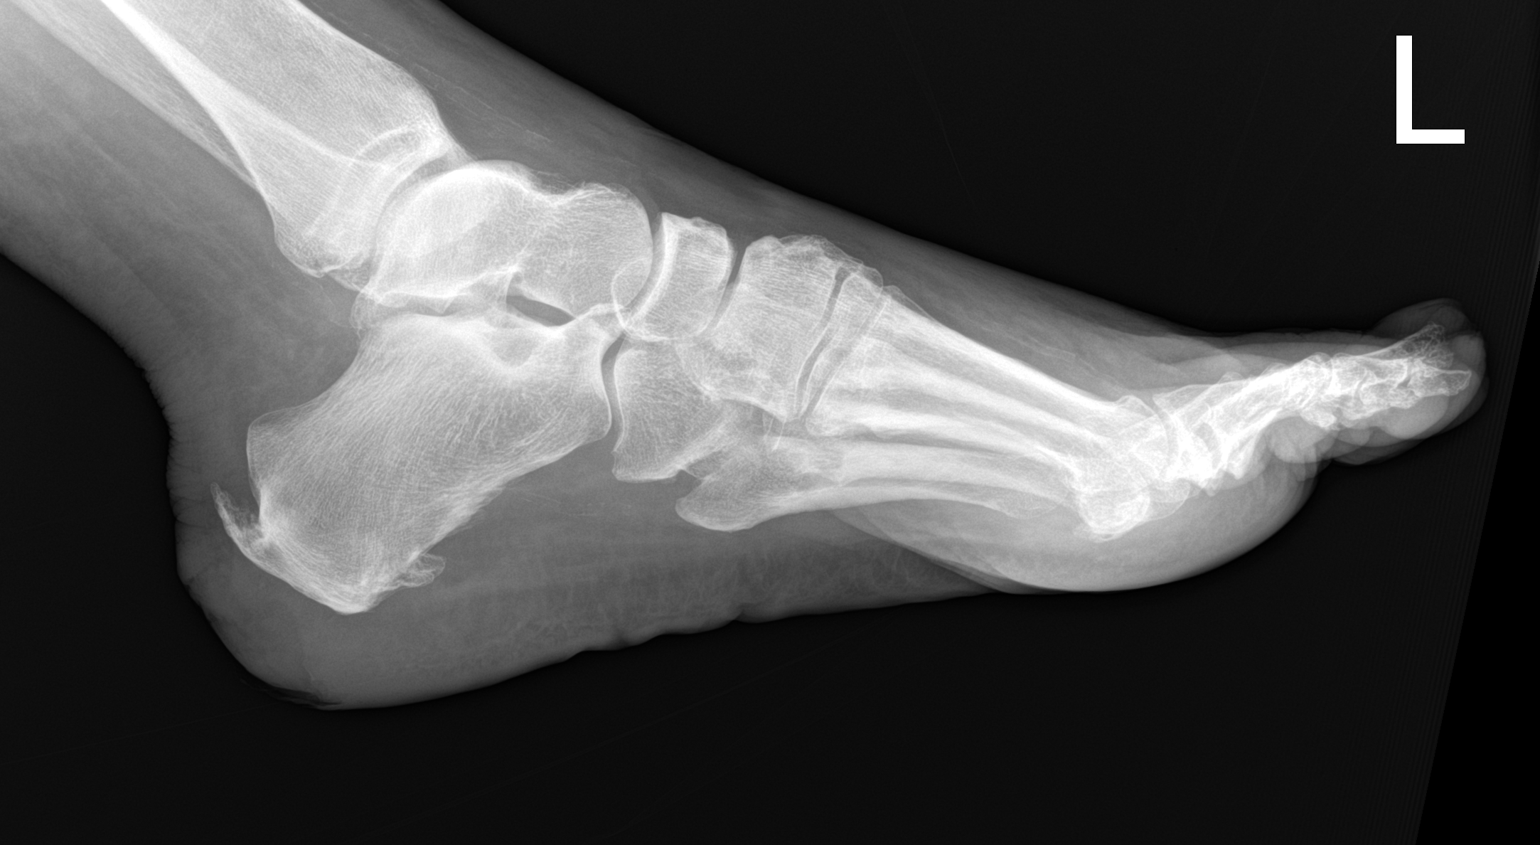

[3 of 3 positions shown; findings below may reference images not displayed]

FINDINGS: Mild hallux valgus deformity of the great toe. Mild degenerative
changes of the first metatarsophalangeal joint. Atherosclerotic
changes seen throughout visualized arterial segments.

Enthesopathic changes are seen at the insertion of the plantar
fascia and Achilles tendon on the calcaneus.

No osseous erosions are identified to indicate osteomyelitis.

Soft tissue ulcer noted overlying the posterior calcaneus.
IMPRESSION: Soft tissue ulcer of the posterior heel without underlying osseous
erosion to indicate osteomyelitis. If there is high clinical
suspicion for osteomyelitis, further evaluation with MRI should be
performed.

## 2022-06-20 MED ORDER — SODIUM CHLORIDE 0.9 % IV SOLN
1.0000 g | INTRAVENOUS | Status: AC
  Administered 2022-06-20 – 2022-06-21 (×2): 1 g via INTRAVENOUS
  Filled 2022-06-20 (×2): qty 10

## 2022-06-20 MED ORDER — FUROSEMIDE 10 MG/ML IJ SOLN
40.0000 mg | Freq: Once | INTRAMUSCULAR | Status: AC
  Administered 2022-06-20: 40 mg via INTRAVENOUS
  Filled 2022-06-20: qty 4

## 2022-06-20 MED ORDER — ADULT MULTIVITAMIN W/MINERALS CH
1.0000 | ORAL_TABLET | Freq: Every day | ORAL | Status: DC
  Administered 2022-06-20 – 2022-06-23 (×3): 1 via ORAL
  Filled 2022-06-20 (×3): qty 1

## 2022-06-20 MED ORDER — ENSURE MAX PROTEIN PO LIQD
11.0000 [oz_av] | Freq: Two times a day (BID) | ORAL | Status: DC
  Administered 2022-06-20 – 2022-06-22 (×5): 11 [oz_av] via ORAL
  Filled 2022-06-20 (×7): qty 330

## 2022-06-20 MED ORDER — POTASSIUM CHLORIDE 10 MEQ/100ML IV SOLN
10.0000 meq | INTRAVENOUS | Status: AC
  Administered 2022-06-20 (×4): 10 meq via INTRAVENOUS
  Filled 2022-06-20 (×4): qty 100

## 2022-06-20 MED ORDER — POTASSIUM CHLORIDE CRYS ER 20 MEQ PO TBCR
40.0000 meq | EXTENDED_RELEASE_TABLET | Freq: Once | ORAL | Status: AC
  Administered 2022-06-20: 40 meq via ORAL
  Filled 2022-06-20: qty 2

## 2022-06-20 NOTE — Progress Notes (Signed)
Initial Nutrition Assessment  INTERVENTION:   -Ensure MAX Protein po BID, each supplement provides 150 kcal and 30 grams of protein   -Multivitamin with minerals daily  NUTRITION DIAGNOSIS:   Increased nutrient needs related to wound healing as evidenced by estimated needs.  GOAL:   Patient will meet greater than or equal to 90% of their needs  MONITOR:   PO intake, Supplement acceptance, Weight trends, Labs, I & O's, Skin  REASON FOR ASSESSMENT:   Malnutrition Screening Tool    ASSESSMENT:   51 yo female with PMH right-sided breast cancer (on anastrozole and Lupron), asthma, anxiety, CAD, diastolic CHF, CKD, COPD, DM II, HTN, HLD, seizures, CVA.She was admitted for encephalopathy and treatment of hypercalcemia as well as presumed UTI.  Patient not eating that well given disorientation. PO 10-20% of meals.  Would benefit from nutritional supplementation, will order Ensure Max and daily MVI. Pt was having N/V for 3-4 days PTA.   Per weight records, pt has lost 61 lbs since 10/18, this is a 27% wt loss x 2 months, will need to verify UBW following bilateral BKAs.  Medications: Lasix, KLOR-CON, KCl  Labs reviewed: CBGs: 104-200 Low K   NUTRITION - FOCUSED PHYSICAL EXAM:  Unable to complete, working remotely.  Diet Order:   Diet Order             Diet regular Room service appropriate? Yes; Fluid consistency: Thin  Diet effective now                   EDUCATION NEEDS:   No education needs have been identified at this time  Skin:  Skin Assessment: Skin Integrity Issues: Skin Integrity Issues:: Stage I Stage I: sacrum  Last BM:  12/10 -type 5  Height:   Ht Readings from Last 1 Encounters:  06/18/22 '4\' 11"'$  (1.499 m)    Weight:   Wt Readings from Last 1 Encounters:  06/20/22 72.1 kg     BMI:  Body mass index is 35.9 kg/m. -adjusted for bilateral BKAs  Estimated Nutritional Needs:   Kcal:  1200-1400  Protein:  65-75g  Fluid:   1.5L/day  Clayton Bibles, MS, RD, LDN Inpatient Clinical Dietitian Contact information available via Amion
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
## 2022-06-20 NOTE — Progress Notes (Signed)
Pt has been very anxious, much more confused and agitated. Pt didn't want to be touch  then yells to anybody that enters the room. She refused to take her meds and gets very paranoid. Called husband and now at bedside. Pt more calm and was able to take a pill of Xanax but refused to take the rest of her meds. Will continue to monitor pt.

## 2022-06-20 NOTE — Progress Notes (Signed)
Progress Note    [name]   TAV:[id number]  DOB: August 13, 1970  DOA: 06/17/2022     3 PCP: Pleas Koch, NP  Initial CC: AMS  Hospital Course: [name] is a 51 yo female with PMH right-sided breast cancer (on anastrozole and Lupron), asthma, anxiety, CAD, diastolic CHF, CKD, COPD, DM II, HTN, HLD, seizures, CVA. She presented to the ER with altered mentation.  Her husband supplied most of HPI and noted she had been nauseous with vomiting for approximately 3 to 4 days prior to admission.  She then began developing confusion and talking nonsense.  She also became incontinent of urine  She was found to have hypercalcemia on admission (corrected calcium, 15.3 mg/dL).  Urinalysis was notable for small LE, negative nitrite, 21-50 WBC, rare bacteria. WBC 10.8, 1% bands.  She has been undergoing outpatient evaluation with oncology regarding hypercalcemia. Recent bone scan on 05/26/2022 did show some radiotracer uptake in the left frontal bone and right greater trochanter. CT chest/abdomen/pelvis on admission notable for lytic lesion involving right femoral neck consistent with bony metastatic disease.  Also noted soft tissue density at the right hilum concerning for possible neoplasm.  Scattered pulmonary nodules with increase in size of right lower lobe pulmonary nodule noted. CT head unremarkable.  She was admitted for encephalopathy and treatment of hypercalcemia as well as presumed UTI.  Interval History:  No events overnight.  Mentation does show some improvement today and she is more awake and alert however still has slow thought process and still not oriented to year, president, or where she is.  Assessment and Plan: * Hypercalcemia - wide differential: malignancy highest suspicion (Br cancer recurrence with bone mets vs MM) vs med side effect of anastrazole (lower chance?) - Patient presumed to be symptomatic at this time with acute encephalopathy along with generalized  abdominal pains and N/V on admission - cCa on admission 15.3 mg/dL; she was given IVF and zometa -Started on calcitonin on 06/18/2022 and has had good response with improvement in corrected calcium level along with some improvement in mentation - calcitonin x 4 doses completed - s/p lasix x 1 on 06/18/22 as well - give lasix again on 12/11 - previously low suspicion for malignancy per oncology at last [place] on 05/13/22, but since visit has had bone scan on 05/26/22 and CT chest 05/25/22; concern now for potential recurrence and she was scheduled for PET scan on 12/27; current workup still supports recurrence with CT's on admission - seen by oncology on 12/11  UTI (urinary tract infection) - UA noted with small LE, negative nitrite, 21-50 WBC, rare bacteria - Received Rocephin on admission.  Given hypercalcemia, transitioned to cefepime, now okay to go back to Rocephin and will finish out 5 day course total since mentation still not fully normal - Ucx growing ecoli; follow up sens  Acute metabolic encephalopathy - Suspected multifactorial in setting of underlying severe hypercalcemia and possible UTI - Continuing treatment for both and will monitor mentation changes  Carcinoma of lower-outer quadrant of right breast in female, estrogen receptor positive ([place]) - follows with Dr. [name]; last OV 05/11/22 - ER positive - diagnosed April 2021 s/p masectomy and treated with tamoxifen (then stopped) and anastrazole and lupron  - possible that anastrazole contributing to hypercalcemia as well? - check Ca 15-3 and Ca 27.29 -Evaluated by oncology on 06/20/2022.  Patient recommended for pulmonology consultation to see if patient able to undergo bronchoscopy with biopsy  Lytic bone lesion of right femur -  Suspected etiology breast cancer recurrence with metastasis versus new second primary malignancy with mets versus less likely multiple myeloma - checking MM panel -Patient evaluated by oncology on  06/20/2022, see breast cancer  Hematemesis-resolved as of 06/18/2022 - No further recurrence since admission - Patient also not a good candidate for endoscopies at this time given hypercalcemia and encephalopathy - Evaluated by GI, appreciate assistance.  For now continuing Protonix and advancing diet.  No plans for invasive workup  DM2 (diabetes mellitus, type 2) ([place]) - Continue SSI and CBG monitoring -Last A1c 10.3% on 03/04/2022  HTN (hypertension) - No meds noted on med rec - Trend BP and will treat as needed  Hyperlipidemia - Hold statin  Coronary artery disease involving native heart without angina pectoris - Hold aspirin and statin for now  Chronic heart failure with preserved ejection fraction (HFpEF) ([place]) - no s/s exac - last echo 11/09/21: EF 60-65%, no RWMA, normal diastology  Bipolar disorder ([place]) - Hold home meds for now, will resume slowly as mentation hopefully improves  COPD with chronic bronchitis - no s/s exac   Old records reviewed in assessment of this patient  Antimicrobials: Rocephin 12/8 >> 12/9 Cefepime 12/9 >> 12/11 Rocephin 12/11 >> current  DVT prophylaxis:  enoxaparin (LOVENOX) injection 40 mg Start: 06/18/22 1300  Code Status:   Code Status: DNR  Mobility Assessment (last 72 hours)     Mobility Assessment     Row Name 06/20/22 0945 06/19/22 2259 06/19/22 0755 06/18/22 1940 06/18/22 0800   Does patient have an order for bedrest or is patient medically unstable Yes- Bedfast (Level 1) - Complete No - Continue assessment No - Continue assessment Yes- Bedfast (Level 1) - Complete --   What is the highest level of mobility based on the progressive mobility assessment? Level 1 (Bedfast) - Unable to balance while sitting on edge of bed Level 2 (Chairfast) - Balance while sitting on edge of bed and cannot stand Level 1 (Bedfast) - Unable to balance while sitting on edge of bed Level 1 (Bedfast) - Unable to balance while sitting on edge of bed Level  1 (Bedfast) - Unable to balance while sitting on edge of bed   Is the above level different from baseline mobility prior to current illness? -- -- Yes - Recommend PT order -- Yes - Recommend PT order    Row Name 06/18/22 0600           Does patient have an order for bedrest or is patient medically unstable Yes- Bedfast (Level 1) - Complete       What is the highest level of mobility based on the progressive mobility assessment? Level 1 (Bedfast) - Unable to balance while sitting on edge of bed       Is the above level different from baseline mobility prior to current illness? Yes - Recommend PT order                Barriers to discharge:  Disposition Plan:  Home 2-3 days Status is: Inpt  Objective: Blood pressure (!) 151/54, pulse 73, temperature 97.7 F (36.5 C), temperature source Oral, resp. rate (!) 22, height 4' 11" (1.499 m), weight 72.1 kg, SpO2 100 %.  Examination:  Physical Exam Constitutional:      Comments: Still confused but slightly improved compared to yesterday.  More awake and alert.  HENT:     Head: Normocephalic and atraumatic.     Mouth/Throat:     Mouth: Mucous membranes  are moist.  Eyes:     Extraocular Movements: Extraocular movements intact.  Cardiovascular:     Rate and Rhythm: Normal rate and regular rhythm.  Pulmonary:     Effort: Pulmonary effort is normal.     Breath sounds: Normal breath sounds.  Abdominal:     General: Bowel sounds are normal. There is no distension.     Palpations: Abdomen is soft.     Comments: Improved tenderness, now essentially minimal  Musculoskeletal:        General: Normal range of motion.     Cervical back: Normal range of motion and neck supple.  Skin:    General: Skin is warm.  Neurological:     Comments: Easily able to tell me her name which is improved compared to admission.  Thought process still slow and still not oriented to other questions.  Follows commands easier      Consultants:   Oncology Pulmonology  Procedures:    Data Reviewed: Results for orders placed or performed during the hospital encounter of 06/17/22 (from the past 24 hour(s))  Glucose, capillary     Status: Abnormal   Collection Time: 06/19/22  4:36 PM  Result Value Ref Range   Glucose-Capillary 164 (H) 70 - 99 mg/dL  Glucose, capillary     Status: Abnormal   Collection Time: 06/19/22  9:13 PM  Result Value Ref Range   Glucose-Capillary 104 (H) 70 - 99 mg/dL  CBC with Differential/Platelet     Status: Abnormal   Collection Time: 06/20/22  4:38 AM  Result Value Ref Range   WBC 12.3 (H) 4.0 - 10.5 K/uL   RBC 4.44 3.87 - 5.11 MIL/uL   Hemoglobin 12.6 12.0 - 15.0 g/dL   HCT 36.5 36.0 - 46.0 %   MCV 82.2 80.0 - 100.0 fL   MCH 28.4 26.0 - 34.0 pg   MCHC 34.5 30.0 - 36.0 g/dL   RDW 14.6 11.5 - 15.5 %   Platelets 191 150 - 400 K/uL   nRBC 0.0 0.0 - 0.2 %   Neutrophils Relative % 77 %   Neutro Abs 9.7 (H) 1.7 - 7.7 K/uL   Lymphocytes Relative 12 %   Lymphs Abs 1.4 0.7 - 4.0 K/uL   Monocytes Relative 7 %   Monocytes Absolute 0.9 0.1 - 1.0 K/uL   Eosinophils Relative 2 %   Eosinophils Absolute 0.2 0.0 - 0.5 K/uL   Basophils Relative 1 %   Basophils Absolute 0.1 0.0 - 0.1 K/uL   Immature Granulocytes 1 %   Abs Immature Granulocytes 0.08 (H) 0.00 - 0.07 K/uL  Comprehensive metabolic panel     Status: Abnormal   Collection Time: 06/20/22  4:38 AM  Result Value Ref Range   Sodium 138 135 - 145 mmol/L   Potassium 2.9 (L) 3.5 - 5.1 mmol/L   Chloride 113 (H) 98 - 111 mmol/L   CO2 17 (L) 22 - 32 mmol/L   Glucose, Bld 119 (H) 70 - 99 mg/dL   BUN 16 6 - 20 mg/dL   Creatinine, Ser 0.85 0.44 - 1.00 mg/dL   Calcium 10.2 8.9 - 10.3 mg/dL   Total Protein 6.4 (L) 6.5 - 8.1 g/dL   Albumin 2.7 (L) 3.5 - 5.0 g/dL   AST 22 15 - 41 U/L   ALT 15 0 - 44 U/L   Alkaline Phosphatase 71 38 - 126 U/L   Total Bilirubin 1.0 0.3 - 1.2 mg/dL   GFR, Estimated >60 >60 mL/min  Anion gap 8 5 - 15  Magnesium      Status: None   Collection Time: 06/20/22  4:38 AM  Result Value Ref Range   Magnesium 2.1 1.7 - 2.4 mg/dL  Glucose, capillary     Status: Abnormal   Collection Time: 06/20/22  7:38 AM  Result Value Ref Range   Glucose-Capillary 115 (H) 70 - 99 mg/dL  Glucose, capillary     Status: Abnormal   Collection Time: 06/20/22 11:33 AM  Result Value Ref Range   Glucose-Capillary 147 (H) 70 - 99 mg/dL   *Note: Due to a large number of results and/or encounters for the requested time period, some results have not been displayed. A complete set of results can be found in Results Review.    I have Reviewed nursing notes, Vitals, and Lab results since pt's last encounter. Pertinent lab results : see above I have ordered test including BMP, CBC, Mg I have reviewed the last note from staff over past 24 hours I have discussed pt's care plan and test results with nursing staff, case manager  Time spent: Greater than 50% of the 55 minute visit was spent in counseling/coordination of care for the patient as laid out in the A&P.    LOS: 3 days   Dwyane Dee, MD [facility] 06/20/2022, 4:04 PM

## 2022-06-20 NOTE — Plan of Care (Signed)
  Problem: Clinical Measurements: Goal: Cardiovascular complication will be avoided Outcome: Progressing   Problem: Pain Managment: Goal: General experience of comfort will improve Outcome: Progressing   Problem: Education: Goal: Knowledge of General Education information will improve Description: Including pain rating scale, medication(s)/side effects and non-pharmacologic comfort measures Outcome: Not Progressing   Problem: Nutrition: Goal: Adequate nutrition will be maintained Outcome: Not Progressing

## 2022-06-20 NOTE — Progress Notes (Signed)
[name]   DOB:1971/01/08   WP#:[id number]   [id number]  Oncology follow up note   Subjective: Patient is well-known to our service, under my partner Dr. Aletha Halim care for her breast cancer.  She presented to the hospital with altered mental status, and was admitted for severe hypercalcemia.  Her mental status has improved after treatment, she is awake, alert, but is still slow in response, and cannot answer all the questions.  She acknowledged that if she has pain, but she cannot tell me where her pain is.   Objective:  Vitals:   06/19/22 2116 06/20/22 0515  BP: (!) 151/67 (!) 151/54  Pulse: 77 73  Resp: 16 (!) 22  Temp: 98 F (36.7 C) 97.7 F (36.5 C)  SpO2: 98% 100%    Body mass index is 32.1 kg/m.  Intake/Output Summary (Last 24 hours) at 06/20/2022 1517 Last data filed at 06/20/2022 1223 Gross per 24 hour  Intake 1701.14 ml  Output 500 ml  Net 1201.14 ml     Sclerae unicteric  Oropharynx clear  No peripheral adenopathy  Lungs clear -- no rales or rhonchi  Heart regular rate and rhythm  Abdomen benign  MSK status post bilateral BKA  Neuro nonfocal  Breast exam: Status post right mastectomy, no palpable nodule on right side chest wall.  CBG (last 3)  Recent Labs    06/19/22 2113 06/20/22 0738 06/20/22 1133  GLUCAP 104* 115* 147*     Labs:  Urine Studies No results for input(s): "UHGB", "CRYS" in the last 72 hours.  Invalid input(s): "UACOL", "UAPR", "USPG", "UPH", "UTP", "UGL", "UKET", "UBIL", "UNIT", "UROB", "ULEU", "UEPI", "UWBC", "URBC", "UBAC", "CAST", "UCOM", "BILUA"  Basic Metabolic Panel: Recent Labs  Lab 06/17/22 1430 06/17/22 1608 06/17/22 1630 06/18/22 0602 06/19/22 0502 06/20/22 0438  NA 142 143  --  145 140 138  K 4.2 3.7  --  3.0* 3.2* 2.9*  CL 109 110  --  115* 113* 113*  CO2 24 27  --  21* 21* 17*  GLUCOSE 227* 226*  --  176* 170* 119*  BUN 20 23*  --  _0 CREATININE 1.08* 1.15*  --  1.09* 1.05* 0.85  CALCIUM  14.7* >15.0* 14.7 14.3* 11.4* 10.2  MG  --   --   --   --  1.5* 2.1  PHOS  --  2.7  --   --   --   --    GFR Estimated Creatinine Clearance: 66.2 mL/min (by C-G formula based on SCr of 0.85 mg/dL). Liver Function Tests: Recent Labs  Lab 06/17/22 1430 06/17/22 1608 06/18/22 0602 06/19/22 0502 06/20/22 0438  AST 27  --  _1 ALT 19  --  _2 ALKPHOS 88  --  78 74 71  BILITOT 1.1  --  0.8 0.9 1.0  PROT 8.1  --  7.3 6.8 6.4*  ALBUMIN 3.3* 3.2* 3.0* 2.8* 2.7*   No results for input(s): "LIPASE", "AMYLASE" in the last 168 hours. No results for input(s): "AMMONIA" in the last 168 hours. Coagulation profile No results for input(s): "INR", "PROTIME" in the last 168 hours.  CBC: Recent Labs  Lab 06/17/22 1430 06/17/22 1850 06/18/22 0026 06/18/22 0602 06/19/22 0502 06/20/22 0438  WBC 10.8*  --   --  11.7* 12.2* 12.3*  NEUTROABS 8.7*  --   --   --  10.0* 9.7*  HGB 15.2* 13.3 11.9* 14.1 13.2 12.6  HCT 45.3 40.0 34.8* 41.9  38.9 36.5  MCV 83.1  --   --  83.0 83.3 82.2  PLT 240  --   --  209 209 191   Cardiac Enzymes: No results for input(s): "CKTOTAL", "CKMB", "CKMBINDEX", "TROPONINI" in the last 168 hours. BNP: Invalid input(s): "POCBNP" CBG: Recent Labs  Lab 06/19/22 1114 06/19/22 1636 06/19/22 2113 06/20/22 0738 06/20/22 1133  GLUCAP 200* 164* 104* 115* 147*   D-Dimer No results for input(s): "DDIMER" in the last 72 hours. Hgb A1c No results for input(s): "HGBA1C" in the last 72 hours. Lipid Profile No results for input(s): "CHOL", "HDL", "LDLCALC", "TRIG", "CHOLHDL", "LDLDIRECT" in the last 72 hours. Thyroid function studies No results for input(s): "TSH", "T4TOTAL", "T3FREE", "THYROIDAB" in the last 72 hours.  Invalid input(s): "FREET3" Anemia work up No results for input(s): "VITAMINB12", "FOLATE", "FERRITIN", "TIBC", "IRON", "RETICCTPCT" in the last 72 hours. Microbiology Recent Results (from the past 240 hour(s))  Resp Panel by RT-PCR (Flu A&B,  Covid) Anterior Nasal Swab     Status: None   Collection Time: 06/17/22  2:30 PM   Specimen: Anterior Nasal Swab  Result Value Ref Range Status   SARS Coronavirus 2 by RT PCR NEGATIVE NEGATIVE Final    Comment: (NOTE) SARS-CoV-2 target nucleic acids are NOT DETECTED.  The SARS-CoV-2 RNA is generally detectable in upper respiratory specimens during the acute phase of infection. The lowest concentration of SARS-CoV-2 viral copies this assay can detect is 138 copies/mL. A negative result does not preclude SARS-Cov-2 infection and should not be used as the sole basis for treatment or other patient management decisions. A negative result may occur with  improper specimen collection/handling, submission of specimen other than nasopharyngeal swab, presence of viral mutation(s) within the areas targeted by this assay, and inadequate number of viral copies(<138 copies/mL). A negative result must be combined with clinical observations, patient history, and epidemiological information. The expected result is Negative.  Fact Sheet for Patients:  EntrepreneurPulse.com.au  Fact Sheet for Healthcare Providers:  IncredibleEmployment.be  This test is no t yet approved or cleared by the [place] FDA and  has been authorized for detection and/or diagnosis of SARS-CoV-2 by FDA under an Emergency Use Authorization (EUA). This EUA will remain  in effect (meaning this test can be used) for the duration of the COVID-19 declaration under Section 564(b)(1) of the Act, 21 U.S.C.section 360bbb-3(b)(1), unless the authorization is terminated  or revoked sooner.       Influenza A by PCR NEGATIVE NEGATIVE Final   Influenza B by PCR NEGATIVE NEGATIVE Final    Comment: (NOTE) The Xpert Xpress SARS-CoV-2/FLU/RSV plus assay is intended as an aid in the diagnosis of influenza from Nasopharyngeal swab specimens and should not be used as a sole basis for treatment. Nasal  washings and aspirates are unacceptable for Xpert Xpress SARS-CoV-2/FLU/RSV testing.  Fact Sheet for Patients: EntrepreneurPulse.com.au  Fact Sheet for Healthcare Providers: IncredibleEmployment.be  This test is not yet approved or cleared by the [place] FDA and has been authorized for detection and/or diagnosis of SARS-CoV-2 by FDA under an Emergency Use Authorization (EUA). This EUA will remain in effect (meaning this test can be used) for the duration of the COVID-19 declaration under Section 564(b)(1) of the Act, 21 U.S.C. section 360bbb-3(b)(1), unless the authorization is terminated or revoked.  Performed at [facility], [place] [address], [place], [place] [address]   Urine Culture     Status: Abnormal (Preliminary result)   Collection Time: 06/17/22  2:30 PM  Specimen: Urine, Clean Catch  Result Value Ref Range Status   Specimen Description   Final    URINE, CLEAN CATCH Performed at [facility], [place] [address] [place], [place] [address]    Special Requests   Final    NONE Performed at [facility], [place] [address] [place], [place] [address]    Culture (A)  Final    >=100,000 COLONIES/mL ESCHERICHIA COLI SUSCEPTIBILITIES TO FOLLOW Performed at [facility], [place] [address], [place], [place] [address]    Report Status PENDING  Incomplete  Culture, blood (Routine X 2) w Reflex to ID Panel     Status: None (Preliminary result)   Collection Time: 06/17/22  4:08 PM   Specimen: BLOOD LEFT HAND  Result Value Ref Range Status   Specimen Description   Final    BLOOD LEFT HAND Performed at [facility], [place] [address], [place], [place] [address]    Special Requests   Final    BOTTLES DRAWN AEROBIC AND ANAEROBIC Blood Culture results may not be optimal due to an inadequate volume of blood received in culture bottles Performed at [place] [address], [place], [place] [address]    Culture   Final    NO GROWTH 3 DAYS Performed at [facility], [place] [address], [place], [place] [address]    Report Status PENDING  Incomplete  Culture, blood (Routine X 2) w Reflex to ID Panel     Status: None (Preliminary result)   Collection Time: 06/17/22  4:08 PM   Specimen: BLOOD LEFT FOREARM  Result Value Ref Range Status   Specimen Description   Final    BLOOD LEFT FOREARM Performed at [facility], [place] [address], [place], [place] [address]    Special Requests   Final    BOTTLES DRAWN AEROBIC AND ANAEROBIC Blood Culture results may not be optimal due to an excessive volume of blood received in culture bottles Performed at [place] [address] [place], [place] [address]    Culture   Final    NO GROWTH 3 DAYS Performed at [facility], [place] [address], [place], [place] [address]    Report Status PENDING  Incomplete  MRSA Next Gen by PCR, Nasal     Status: Abnormal   Collection Time: 06/18/22  6:10 AM   Specimen: Nasal Mucosa; Nasal Swab  Result Value Ref Range Status   MRSA by PCR Next Gen DETECTED (A) NOT DETECTED Final    Comment: (NOTE) The GeneXpert MRSA Assay (FDA approved for NASAL specimens only), is one component of a comprehensive MRSA colonization surveillance program. It is not intended to diagnose MRSA infection nor to guide or monitor treatment for MRSA infections. Test performance is not FDA approved in patients less than 39 years old. Performed at [facility], [place] [address], [place], [place] [address]       Studies:  No results found.  Assessment: 51 y.o. female   Hypercalcemia, likely related to her malignancy. Acute metabolic encephalopathy, improved History of right breast cancer,pT2N0, ER positive, on anastrozole and Lupron Mediastinum and right hilar adenopathy, right femur bone lesion, highly suspicious for metastatic cancer. Type 2  diabetes Hypertension COPD Bipolar    Plan:  -I have personally reviewed her lab and reason the CT images, and discussed the findings with patient.  I did try to call her husband, no answer,  and I was not able to leave a message. -Her clinical presentation and workup are highly suspicious for metastatic cancer, especially from her previous breast cancer.  I recommend pulmonary consult for bronchoscopy and biopsy.  Will repeat the ER, PR and HER2 on her biopsy sample. -Her hypercalcemia has resolved after calcitonin, which may not last very long, so I recommend one dose zometa 70m for her since her renal function is normal now -If the biopsy confirms metastatic breast cancer, she will likely need changing her treatment, likely fulvestrant and CDK4/6 inhibitor. We can also do molecular testing to see if she is a candidate for other targeted therapy, I will defer this to her primary oncologist Dr. BRogue Bussing  -I will f/u later this week if she is still here, or sooner if needed.  -I communicated with Dr. GMarveen Reeks MD 06/20/2022  3:17 PM

## 2022-06-21 ENCOUNTER — Ambulatory Visit: Payer: BC Managed Care – PPO | Admitting: Internal Medicine

## 2022-06-21 LAB — URINE CULTURE: Culture: >=100000 — AB

## 2022-06-21 LAB — CBC WITH DIFFERENTIAL/PLATELET
Abs Immature Granulocytes: 0.08 10*3/uL — ABNORMAL HIGH (ref 0.00–0.07)
Basophils Absolute: 0.1 10*3/uL (ref 0.0–0.1)
Basophils Relative: 1 %
Eosinophils Absolute: 0.2 10*3/uL (ref 0.0–0.5)
Eosinophils Relative: 1 %
HCT: 37.6 % (ref 36.0–46.0)
Hemoglobin: 12.9 g/dL (ref 12.0–15.0)
Immature Granulocytes: 1 %
Lymphocytes Relative: 8 %
Lymphs Abs: 1.1 10*3/uL (ref 0.7–4.0)
MCH: 28 pg (ref 26.0–34.0)
MCHC: 34.3 g/dL (ref 30.0–36.0)
MCV: 81.6 fL (ref 80.0–100.0)
Monocytes Absolute: 0.8 10*3/uL (ref 0.1–1.0)
Monocytes Relative: 6 %
Neutro Abs: 12 10*3/uL — ABNORMAL HIGH (ref 1.7–7.7)
Neutrophils Relative %: 83 %
Platelets: 195 10*3/uL (ref 150–400)
RBC: 4.61 MIL/uL (ref 3.87–5.11)
RDW: 14.6 % (ref 11.5–15.5)
WBC: 14.2 10*3/uL — ABNORMAL HIGH (ref 4.0–10.5)
nRBC: 0 % (ref 0.0–0.2)

## 2022-06-21 LAB — COMPREHENSIVE METABOLIC PANEL
ALT: 15 U/L (ref 0–44)
AST: 21 U/L (ref 15–41)
Albumin: 2.5 g/dL — ABNORMAL LOW (ref 3.5–5.0)
Alkaline Phosphatase: 74 U/L (ref 38–126)
Anion gap: 9 (ref 5–15)
BUN: 22 mg/dL — ABNORMAL HIGH (ref 6–20)
CO2: 16 mmol/L — ABNORMAL LOW (ref 22–32)
Calcium: 9.8 mg/dL (ref 8.9–10.3)
Chloride: 112 mmol/L — ABNORMAL HIGH (ref 98–111)
Creatinine, Ser: 0.82 mg/dL (ref 0.44–1.00)
GFR, Estimated: >60 mL/min (ref >60)
Glucose, Bld: 135 mg/dL — ABNORMAL HIGH (ref 70–99)
Potassium: 3.2 mmol/L — ABNORMAL LOW (ref 3.5–5.1)
Sodium: 137 mmol/L (ref 135–145)
Total Bilirubin: 0.8 mg/dL (ref 0.3–1.2)
Total Protein: 6.3 g/dL — ABNORMAL LOW (ref 6.5–8.1)

## 2022-06-21 LAB — CANCER ANTIGEN 15-3: CA 15-3: 104 U/mL — ABNORMAL HIGH (ref 0.0–25.0)

## 2022-06-21 LAB — MAGNESIUM: Magnesium: 1.7 mg/dL (ref 1.7–2.4)

## 2022-06-21 LAB — GLUCOSE, CAPILLARY
Glucose-Capillary: 115 mg/dL — ABNORMAL HIGH (ref 70–99)
Glucose-Capillary: 162 mg/dL — ABNORMAL HIGH (ref 70–99)
Glucose-Capillary: 89 mg/dL (ref 70–99)
Glucose-Capillary: 102 mg/dL — ABNORMAL HIGH (ref 70–99)

## 2022-06-21 LAB — CANCER ANTIGEN 27.29: CA 27.29: 111.2 U/mL — ABNORMAL HIGH (ref 0.0–38.6)

## 2022-06-21 IMAGING — US US EXTREM LOW VENOUS*R*
1 series · 14 of 24 positions shown · non-contrast
Comparison: None.

CLINICAL DATA: Cellulitis

Pain and edema
EXAM:
RIGHT LOWER EXTREMITY VENOUS DOPPLER ULTRASOUND
TECHNIQUE: Gray-scale sonography with compression, as well as color and duplex
ultrasound, were performed to evaluate the deep venous system(s)
from the level of the common femoral vein through the popliteal and
proximal calf veins.

[Series 1: us venous img lower uni right (dvt) · portal-venous · 14 of 32 slices shown]
[im 1/32]
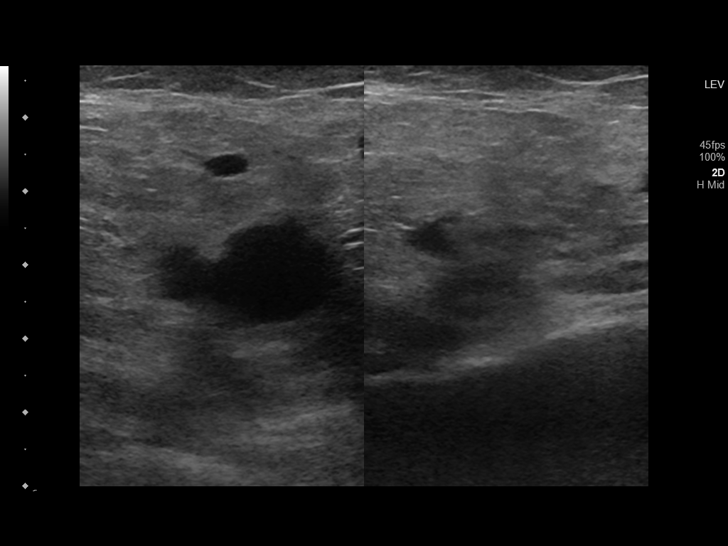
[im 3/32]
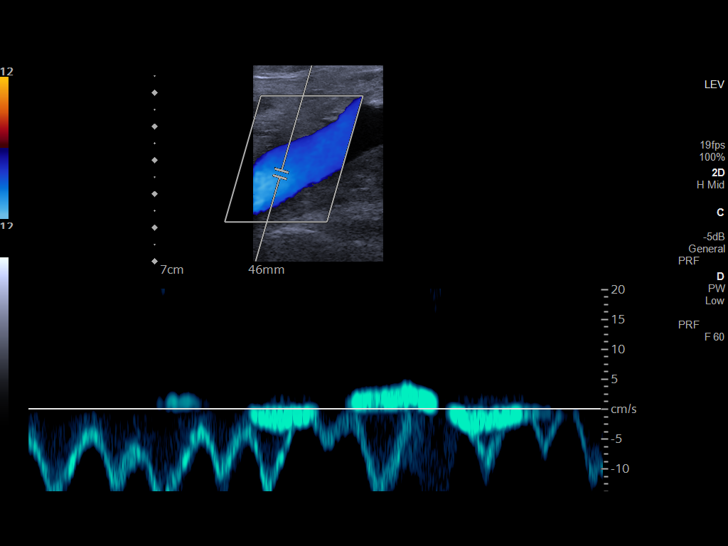
[im 6/32]
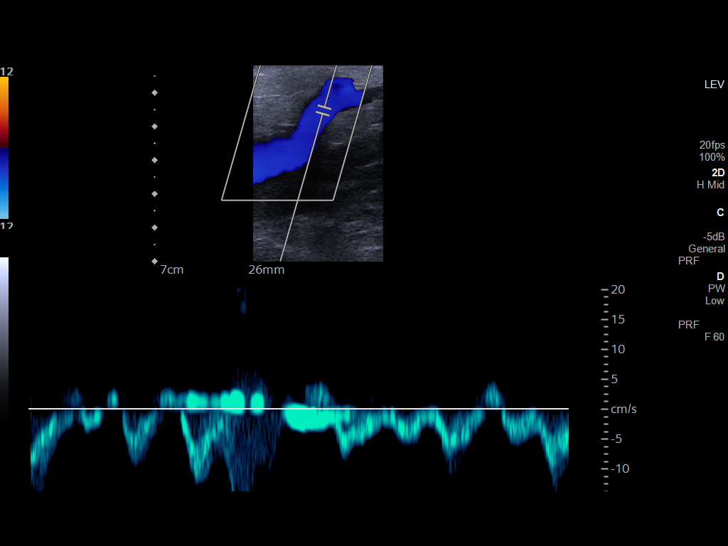
[im 9/32]
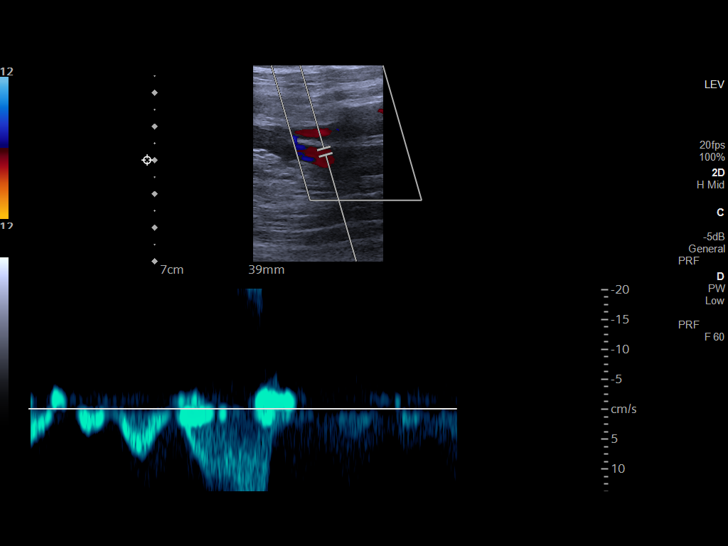
[im 10/32]
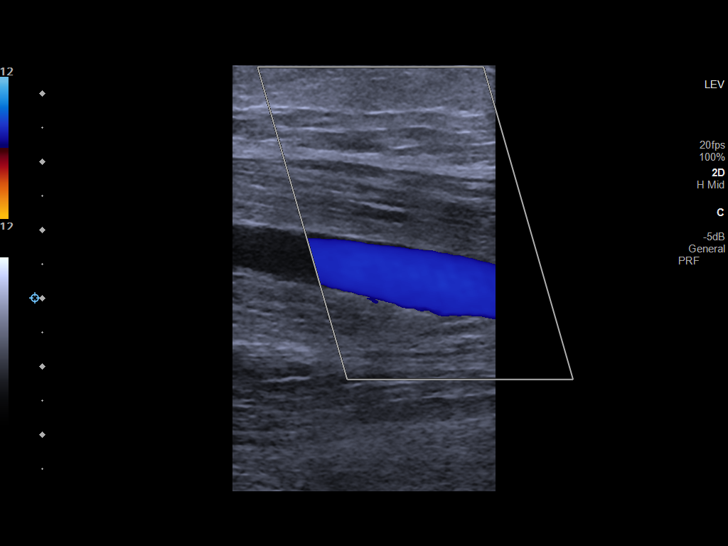
[im 13/32]
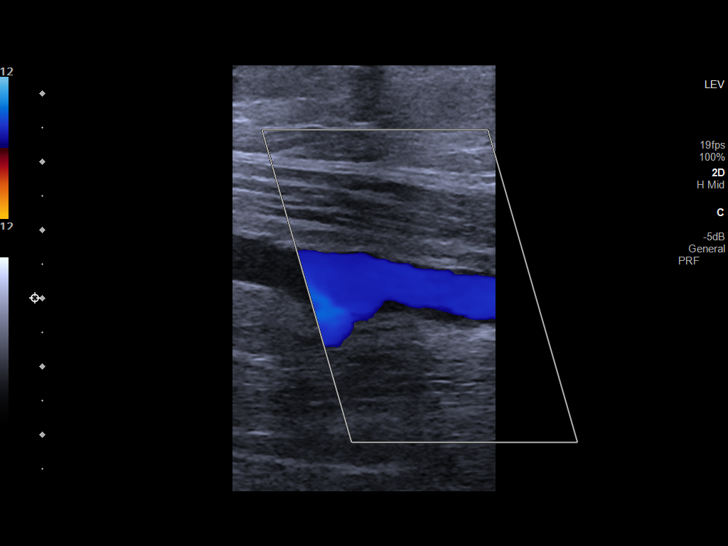
[im 15/32]
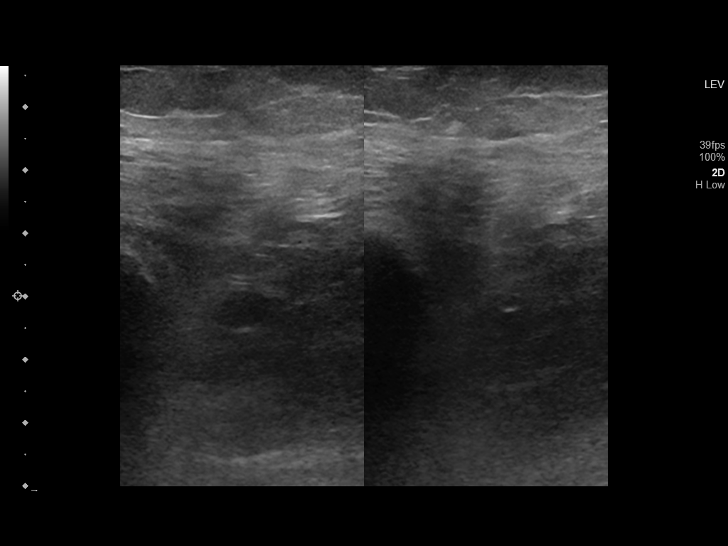
[im 17/32]
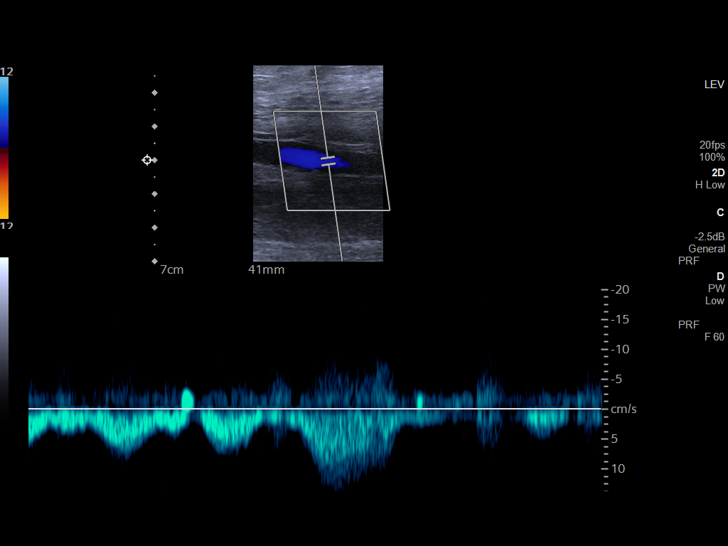
[im 19/32]
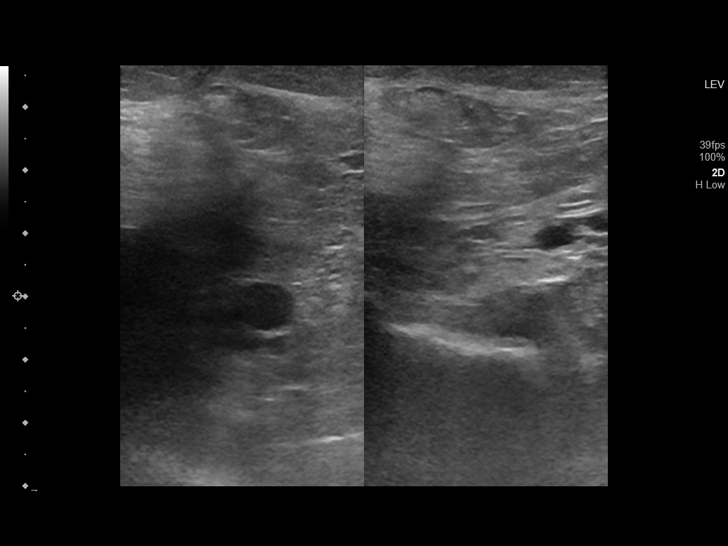
[im 22/32]
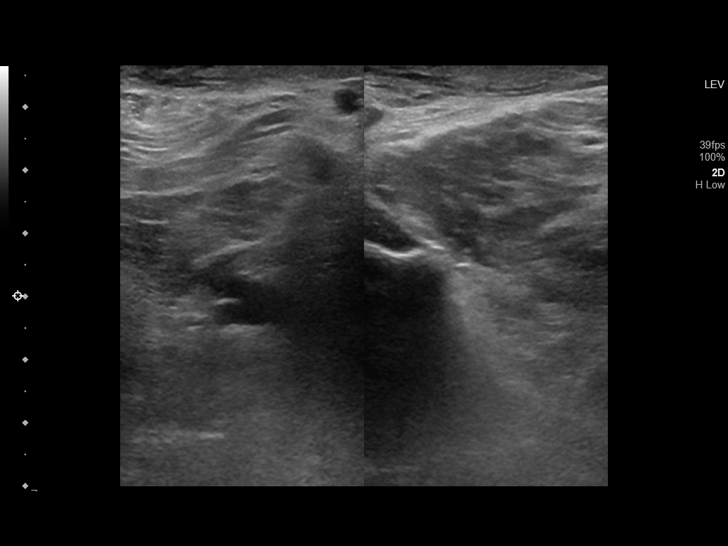
[im 25/32]
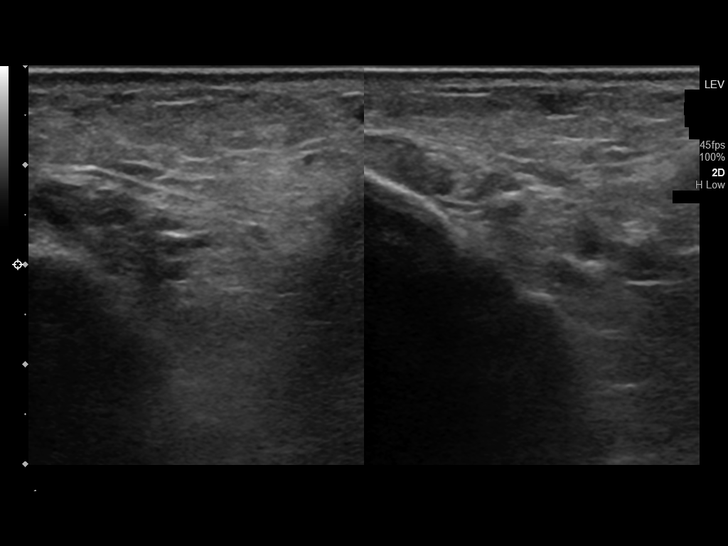
[im 26/32]
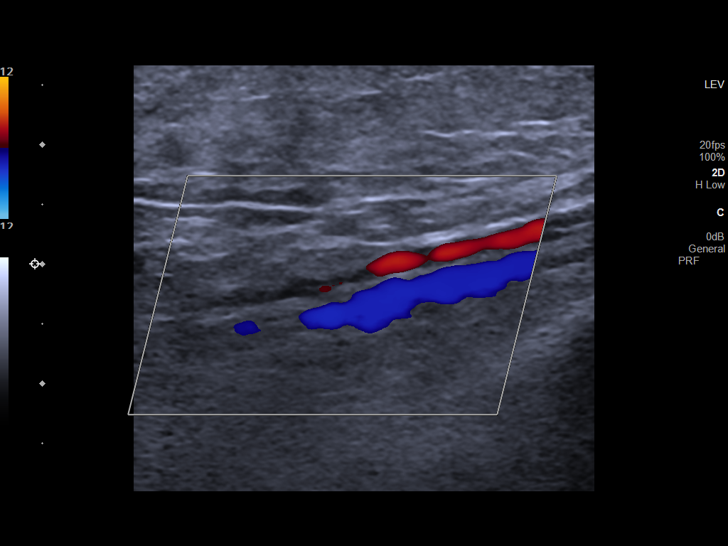
[im 29/32]
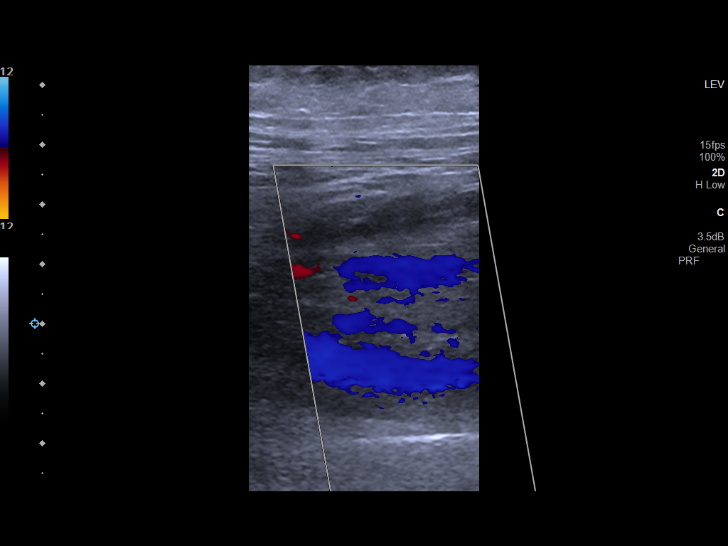
[im 32/32]
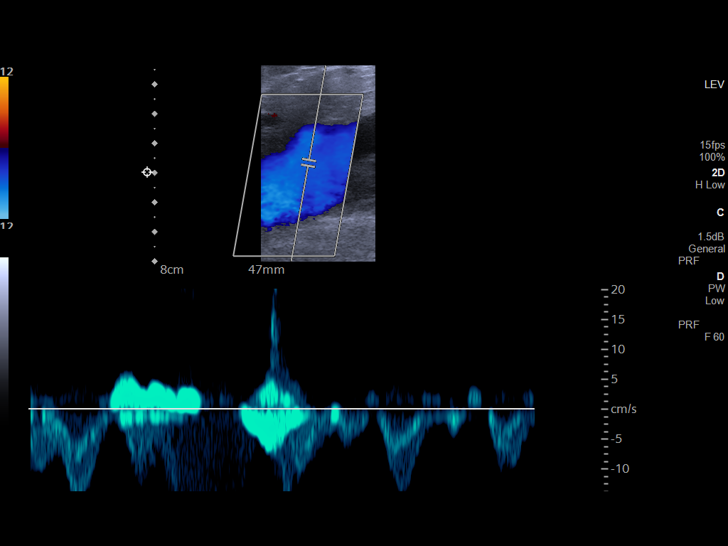

[14 of 24 positions shown; findings below may reference images not displayed]

FINDINGS: VENOUS

Normal compressibility of the common femoral, superficial femoral,
and popliteal veins, as well as the visualized calf veins.
Visualized portions of profunda femoral vein and great saphenous
vein unremarkable. No filling defects to suggest DVT on grayscale or
color Doppler imaging. Doppler waveforms show normal direction of
venous flow, normal respiratory plasticity and response to
augmentation.

Limited views of the contralateral common femoral vein are
unremarkable.

OTHER

None.

Limitations: none
IMPRESSION: No right lower extremity DVT

## 2022-06-21 MED ORDER — POTASSIUM CHLORIDE CRYS ER 20 MEQ PO TBCR
40.0000 meq | EXTENDED_RELEASE_TABLET | Freq: Once | ORAL | Status: AC
  Administered 2022-06-21: 40 meq via ORAL
  Filled 2022-06-21: qty 2

## 2022-06-21 MED ORDER — MAGNESIUM SULFATE 2 GM/50ML IV SOLN
2.0000 g | Freq: Once | INTRAVENOUS | Status: AC
  Administered 2022-06-21: 2 g via INTRAVENOUS
  Filled 2022-06-21: qty 50

## 2022-06-21 MED ORDER — ROPINIROLE HCL 1 MG PO TABS
1.0000 mg | ORAL_TABLET | Freq: Once | ORAL | Status: AC
  Administered 2022-06-21: 1 mg via ORAL
  Filled 2022-06-21: qty 1

## 2022-06-21 NOTE — Progress Notes (Signed)
Progress Note    [name]   NTI:[id number]  DOB: 1970/09/18  DOA: 06/17/2022     4 PCP: Pleas Koch, NP  Initial CC: AMS  Hospital Course: Ms. Fallaw is a 51 yo female with PMH right-sided breast cancer (on anastrozole and Lupron), asthma, anxiety, CAD, diastolic CHF, CKD, COPD, DM II, HTN, HLD, seizures, CVA. She presented to the ER with altered mentation.  Her husband supplied most of HPI and noted she had been nauseous with vomiting for approximately 3 to 4 days prior to admission.  She then began developing confusion and talking nonsense.  She also became incontinent of urine  She was found to have hypercalcemia on admission (corrected calcium, 15.3 mg/dL).  Urinalysis was notable for small LE, negative nitrite, 21-50 WBC, rare bacteria. WBC 10.8, 1% bands.  She has been undergoing outpatient evaluation with oncology regarding hypercalcemia. Recent bone scan on 05/26/2022 did show some radiotracer uptake in the left frontal bone and right greater trochanter. CT chest/abdomen/pelvis on admission notable for lytic lesion involving right femoral neck consistent with bony metastatic disease.  Also noted soft tissue density at the right hilum concerning for possible neoplasm.  Scattered pulmonary nodules with increase in size of right lower lobe pulmonary nodule noted. CT head unremarkable.  She was admitted for encephalopathy and treatment of hypercalcemia as well as presumed UTI.  Interval History:  No events overnight. Still confused but showing some more improvement.   Assessment and Plan: * Hypercalcemia - wide differential: malignancy highest suspicion (Br cancer recurrence with bone mets vs MM) vs med side effect of anastrazole (lower chance?) - Patient presumed to be symptomatic at this time with acute encephalopathy along with generalized abdominal pains and N/V on admission - cCa on admission 15.3 mg/dL; she was given IVF and zometa -Started on calcitonin on  06/18/2022 and has had good response with improvement in corrected calcium level along with some improvement in mentation - calcitonin x 4 doses completed - s/p lasix x 1 on 06/18/22 as well - give lasix again on 12/11 - previously low suspicion for malignancy per oncology at last [facility] on 05/13/22, but since visit has had bone scan on 05/26/22 and CT chest 05/25/22; concern now for potential recurrence and she was scheduled for PET scan on 12/27; current workup still supports recurrence with CT's on admission - seen by oncology on 12/11 -Corrected calcium continues to improve, down to 11 mg/dL this morning  Acute metabolic encephalopathy - Suspected multifactorial in setting of underlying severe hypercalcemia and possible UTI - Continuing treatment for both and will monitor mentation changes  UTI (urinary tract infection)-resolved as of 06/21/2022 - UA noted with small LE, negative nitrite, 21-50 WBC, rare bacteria - Received Rocephin on admission.  Given hypercalcemia, transitioned to cefepime, now okay to go back to Rocephin and will finish out 5 day course total since mentation still not fully normal - Ucx growing ecoli; completing 5-day course  Carcinoma of lower-outer quadrant of right breast in female, estrogen receptor positive ([place]) - follows with Dr. [name]; last OV 05/11/22 - ER positive - diagnosed April 2021 s/p masectomy and treated with tamoxifen (then stopped) and anastrazole and lupron  - possible that anastrazole contributing to hypercalcemia as well? - check Ca 15-3 and Ca 27.29 -Evaluated by oncology on 06/20/2022.  Patient recommended for pulmonology consultation to see if patient able to undergo bronchoscopy with biopsy -Appreciate pulmonology evaluation, EBUS being planned  Lytic bone lesion of right femur - Suspected etiology  breast cancer recurrence with metastasis versus new second primary malignancy with mets versus less likely multiple myeloma - checking MM  panel -Patient evaluated by oncology on 06/20/2022, see breast cancer  Hematemesis-resolved as of 06/18/2022 - No further recurrence since admission - Patient also not a good candidate for endoscopies at this time given hypercalcemia and encephalopathy - Evaluated by GI, appreciate assistance.  For now continuing Protonix and advancing diet.  No plans for invasive workup  DM2 (diabetes mellitus, type 2) ([place]) - Continue SSI and CBG monitoring -Last A1c 10.3% on 03/04/2022  HTN (hypertension) - No meds noted on med rec - Trend BP and will treat as needed  Hyperlipidemia - Hold statin  Coronary artery disease involving native heart without angina pectoris - Hold aspirin and statin for now  Chronic heart failure with preserved ejection fraction (HFpEF) ([place]) - no s/s exac - last echo 11/09/21: EF 60-65%, no RWMA, normal diastology  Bipolar disorder ([place]) - Hold home meds for now, will resume slowly as mentation hopefully improves  COPD with chronic bronchitis - no s/s exac   Old records reviewed in assessment of this patient  Antimicrobials: Rocephin 12/8 >> 12/9 Cefepime 12/9 >> 12/11 Rocephin 12/11 >> current  DVT prophylaxis:  enoxaparin (LOVENOX) injection 40 mg Start: 06/18/22 1300  Code Status:   Code Status: DNR  Mobility Assessment (last 72 hours)     Mobility Assessment     Row Name 06/21/22 0849 06/21/22 0000 06/20/22 0945 06/19/22 2259 06/19/22 0755   Does patient have an order for bedrest or is patient medically unstable No - Continue assessment No - Continue assessment Yes- Bedfast (Level 1) - Complete No - Continue assessment No - Continue assessment   What is the highest level of mobility based on the progressive mobility assessment? Level 1 (Bedfast) - Unable to balance while sitting on edge of bed Level 1 (Bedfast) - Unable to balance while sitting on edge of bed Level 1 (Bedfast) - Unable to balance while sitting on edge of bed Level 2 (Ch[place]) -  Balance while sitting on edge of bed and cannot stand Level 1 (Bedfast) - Unable to balance while sitting on edge of bed   Is the above level different from baseline mobility prior to current illness? -- -- -- -- Yes - Recommend PT order    Row Name 06/18/22 1940           Does patient have an order for bedrest or is patient medically unstable Yes- Bedfast (Level 1) - Complete       What is the highest level of mobility based on the progressive mobility assessment? Level 1 (Bedfast) - Unable to balance while sitting on edge of bed                Barriers to discharge:  Disposition Plan:  Home 2-3 days Status is: Inpt  Objective: Blood pressure (!) 186/76, pulse 85, temperature 98 F (36.7 C), resp. rate 17, height _0  (1.499 m), weight 72.1 kg, SpO2 99 %.  Examination:  Physical Exam Constitutional:      Comments: Still confused but slightly improved compared to yesterday.  More awake and alert.  HENT:     Head: Normocephalic and atraumatic.     Mouth/Throat:     Mouth: Mucous membranes are moist.  Eyes:     Extraocular Movements: Extraocular movements intact.  Cardiovascular:     Rate and Rhythm: Normal rate and regular rhythm.  Pulmonary:  Effort: Pulmonary effort is normal.     Breath sounds: Normal breath sounds.  Abdominal:     General: Bowel sounds are normal. There is no distension.     Palpations: Abdomen is soft.     Comments: Improved tenderness, now essentially minimal  Musculoskeletal:        General: Normal range of motion.     Cervical back: Normal range of motion and neck supple.  Skin:    General: Skin is warm.  Neurological:     Comments: Oriented to name, president, and year (much improved)     Consultants:  Oncology Pulmonology  Procedures:    Data Reviewed: Results for orders placed or performed during the hospital encounter of 06/17/22 (from the past 24 hour(s))  Glucose, capillary     Status: Abnormal   Collection Time:  06/20/22  4:43 PM  Result Value Ref Range   Glucose-Capillary 138 (H) 70 - 99 mg/dL  CBC with Differential/Platelet     Status: Abnormal   Collection Time: 06/21/22  4:50 AM  Result Value Ref Range   WBC 14.2 (H) 4.0 - 10.5 K/uL   RBC 4.61 3.87 - 5.11 MIL/uL   Hemoglobin 12.9 12.0 - 15.0 g/dL   HCT 37.6 36.0 - 46.0 %   MCV 81.6 80.0 - 100.0 fL   MCH 28.0 26.0 - 34.0 pg   MCHC 34.3 30.0 - 36.0 g/dL   RDW 14.6 11.5 - 15.5 %   Platelets 195 150 - 400 K/uL   nRBC 0.0 0.0 - 0.2 %   Neutrophils Relative % 83 %   Neutro Abs 12.0 (H) 1.7 - 7.7 K/uL   Lymphocytes Relative 8 %   Lymphs Abs 1.1 0.7 - 4.0 K/uL   Monocytes Relative 6 %   Monocytes Absolute 0.8 0.1 - 1.0 K/uL   Eosinophils Relative 1 %   Eosinophils Absolute 0.2 0.0 - 0.5 K/uL   Basophils Relative 1 %   Basophils Absolute 0.1 0.0 - 0.1 K/uL   Immature Granulocytes 1 %   Abs Immature Granulocytes 0.08 (H) 0.00 - 0.07 K/uL  Comprehensive metabolic panel     Status: Abnormal   Collection Time: 06/21/22  4:50 AM  Result Value Ref Range   Sodium 137 135 - 145 mmol/L   Potassium 3.2 (L) 3.5 - 5.1 mmol/L   Chloride 112 (H) 98 - 111 mmol/L   CO2 16 (L) 22 - 32 mmol/L   Glucose, Bld 135 (H) 70 - 99 mg/dL   BUN 22 (H) 6 - 20 mg/dL   Creatinine, Ser 0.82 0.44 - 1.00 mg/dL   Calcium 9.8 8.9 - 10.3 mg/dL   Total Protein 6.3 (L) 6.5 - 8.1 g/dL   Albumin 2.5 (L) 3.5 - 5.0 g/dL   AST 21 15 - 41 U/L   ALT 15 0 - 44 U/L   Alkaline Phosphatase 74 38 - 126 U/L   Total Bilirubin 0.8 0.3 - 1.2 mg/dL   GFR, Estimated >60 >60 mL/min   Anion gap 9 5 - 15  Magnesium     Status: None   Collection Time: 06/21/22  4:50 AM  Result Value Ref Range   Magnesium 1.7 1.7 - 2.4 mg/dL  Glucose, capillary     Status: Abnormal   Collection Time: 06/21/22  7:57 AM  Result Value Ref Range   Glucose-Capillary 115 (H) 70 - 99 mg/dL  Glucose, capillary     Status: Abnormal   Collection Time: 06/21/22 11:48 AM  Result  Value Ref Range    Glucose-Capillary 162 (H) 70 - 99 mg/dL   *Note: Due to a large number of results and/or encounters for the requested time period, some results have not been displayed. A complete set of results can be found in Results Review.    I have Reviewed nursing notes, Vitals, and Lab results since pt's last encounter. Pertinent lab results : see above I have ordered test including BMP, CBC, Mg I have reviewed the last note from staff over past 24 hours I have discussed pt's care plan and test results with nursing staff, case manager  Time spent: Greater than 50% of the 55 minute visit was spent in counseling/coordination of care for the patient as laid out in the A&P.    LOS: 4 days   Dwyane Dee, MD [facility] 06/21/2022, 2:56 PM

## 2022-06-21 NOTE — Progress Notes (Signed)
Unable to reach husband.  Unable to reach son, left a voicemail.  In agreement with the recommendations of the treating doctors [place].  Will follow-up after the results of the biopsy/discharge from [facility].  Recommend call us if any questions. GB
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
## 2022-06-22 ENCOUNTER — Inpatient Hospital Stay (HOSPITAL_COMMUNITY): Payer: BC Managed Care – PPO | Admitting: Certified Registered Nurse Anesthetist

## 2022-06-22 ENCOUNTER — Encounter (HOSPITAL_COMMUNITY)
Admission: EM | Disposition: A | Discharge status: Home / Self Care | Payer: Self-pay | Source: Home / Self Care | Attending: Internal Medicine

## 2022-06-22 ENCOUNTER — Encounter (HOSPITAL_COMMUNITY): Payer: Self-pay | Admitting: Internal Medicine

## 2022-06-22 HISTORY — PX: BRONCHIAL NEEDLE ASPIRATION BIOPSY: SHX5106

## 2022-06-22 HISTORY — PX: ENDOBRONCHIAL ULTRASOUND: SHX5096

## 2022-06-22 LAB — CBC WITH DIFFERENTIAL/PLATELET
Abs Immature Granulocytes: 0.05 10*3/uL (ref 0.00–0.07)
Basophils Absolute: 0.1 10*3/uL (ref 0.0–0.1)
Basophils Relative: 1 %
Eosinophils Absolute: 0.4 10*3/uL (ref 0.0–0.5)
Eosinophils Relative: 3 %
HCT: 36 % (ref 36.0–46.0)
Hemoglobin: 12.6 g/dL (ref 12.0–15.0)
Immature Granulocytes: 0 %
Lymphocytes Relative: 14 %
Lymphs Abs: 1.6 10*3/uL (ref 0.7–4.0)
MCH: 28.5 pg (ref 26.0–34.0)
MCHC: 35 g/dL (ref 30.0–36.0)
MCV: 81.4 fL (ref 80.0–100.0)
Monocytes Absolute: 0.9 10*3/uL (ref 0.1–1.0)
Monocytes Relative: 7 %
Neutro Abs: 8.6 10*3/uL — ABNORMAL HIGH (ref 1.7–7.7)
Neutrophils Relative %: 75 %
Platelets: 198 10*3/uL (ref 150–400)
RBC: 4.42 MIL/uL (ref 3.87–5.11)
RDW: 14.6 % (ref 11.5–15.5)
WBC: 11.5 10*3/uL — ABNORMAL HIGH (ref 4.0–10.5)
nRBC: 0 % (ref 0.0–0.2)

## 2022-06-22 LAB — COMPREHENSIVE METABOLIC PANEL
ALT: 14 U/L (ref 0–44)
AST: 21 U/L (ref 15–41)
Albumin: 2.5 g/dL — ABNORMAL LOW (ref 3.5–5.0)
Alkaline Phosphatase: 74 U/L (ref 38–126)
Anion gap: 8 (ref 5–15)
BUN: 21 mg/dL — ABNORMAL HIGH (ref 6–20)
CO2: 18 mmol/L — ABNORMAL LOW (ref 22–32)
Calcium: 9.3 mg/dL (ref 8.9–10.3)
Chloride: 112 mmol/L — ABNORMAL HIGH (ref 98–111)
Creatinine, Ser: 0.64 mg/dL (ref 0.44–1.00)
GFR, Estimated: >60 mL/min (ref >60)
Glucose, Bld: 91 mg/dL (ref 70–99)
Potassium: 3.2 mmol/L — ABNORMAL LOW (ref 3.5–5.1)
Sodium: 138 mmol/L (ref 135–145)
Total Bilirubin: 0.8 mg/dL (ref 0.3–1.2)
Total Protein: 6 g/dL — ABNORMAL LOW (ref 6.5–8.1)

## 2022-06-22 LAB — CULTURE, BLOOD (ROUTINE X 2)
Culture: NO GROWTH
Culture: NO GROWTH

## 2022-06-22 LAB — GLUCOSE, CAPILLARY
Glucose-Capillary: 92 mg/dL (ref 70–99)
Glucose-Capillary: 75 mg/dL (ref 70–99)
Glucose-Capillary: 106 mg/dL — ABNORMAL HIGH (ref 70–99)
Glucose-Capillary: 149 mg/dL — ABNORMAL HIGH (ref 70–99)

## 2022-06-22 LAB — MAGNESIUM: Magnesium: 2.4 mg/dL (ref 1.7–2.4)

## 2022-06-22 SURGERY — ENDOBRONCHIAL ULTRASOUND (EBUS)
Anesthesia: General | Laterality: Bilateral

## 2022-06-22 MED ORDER — PHENYLEPHRINE 80 MCG/ML (10ML) SYRINGE FOR IV PUSH (FOR BLOOD PRESSURE SUPPORT)
PREFILLED_SYRINGE | INTRAVENOUS | Status: DC | PRN
  Administered 2022-06-22: 160 ug via INTRAVENOUS

## 2022-06-22 MED ORDER — SUGAMMADEX SODIUM 200 MG/2ML IV SOLN
INTRAVENOUS | Status: DC | PRN
  Administered 2022-06-22: 144 mg via INTRAVENOUS

## 2022-06-22 MED ORDER — DEXAMETHASONE SODIUM PHOSPHATE 10 MG/ML IJ SOLN
INTRAMUSCULAR | Status: DC | PRN
  Administered 2022-06-22: 4 mg via INTRAVENOUS

## 2022-06-22 MED ORDER — KCL IN DEXTROSE-NACL 20-5-0.45 MEQ/L-%-% IV SOLN: INTRAVENOUS | Status: DC

## 2022-06-22 MED ORDER — ROCURONIUM BROMIDE 10 MG/ML (PF) SYRINGE
PREFILLED_SYRINGE | INTRAVENOUS | Status: DC | PRN
  Administered 2022-06-22: 40 mg via INTRAVENOUS

## 2022-06-22 MED ORDER — DEXTROSE-NACL 5-0.45 % IV SOLN: INTRAVENOUS | Status: DC

## 2022-06-22 MED ORDER — ROPINIROLE HCL 1 MG PO TABS
1.0000 mg | ORAL_TABLET | Freq: Once | ORAL | Status: AC
  Administered 2022-06-22: 1 mg via ORAL
  Filled 2022-06-22: qty 1

## 2022-06-22 MED ORDER — FENTANYL CITRATE (PF) 100 MCG/2ML IJ SOLN
INTRAMUSCULAR | Status: DC | PRN
  Administered 2022-06-22: 50 ug via INTRAVENOUS

## 2022-06-22 MED ORDER — PROPOFOL 10 MG/ML IV BOLUS
INTRAVENOUS | Status: DC | PRN
  Administered 2022-06-22: 100 mg via INTRAVENOUS

## 2022-06-22 MED ORDER — POTASSIUM CHLORIDE IN NACL 20-0.45 MEQ/L-% IV SOLN
INTRAVENOUS | Status: DC
  Filled 2022-06-22 (×2): qty 1000

## 2022-06-22 MED ORDER — FENTANYL CITRATE (PF) 100 MCG/2ML IJ SOLN
INTRAMUSCULAR | Status: AC
  Filled 2022-06-22: qty 2

## 2022-06-22 MED ORDER — LACTATED RINGERS IV SOLN: INTRAVENOUS | Status: DC

## 2022-06-22 MED ORDER — ONDANSETRON HCL 4 MG/2ML IJ SOLN
INTRAMUSCULAR | Status: DC | PRN
  Administered 2022-06-22: 4 mg via INTRAVENOUS

## 2022-06-22 MED ORDER — LIDOCAINE 2% (20 MG/ML) 5 ML SYRINGE
INTRAMUSCULAR | Status: DC | PRN
  Administered 2022-06-22: 40 mg via INTRAVENOUS

## 2022-06-22 MED ORDER — PROPOFOL 10 MG/ML IV BOLUS
INTRAVENOUS | Status: AC
  Filled 2022-06-22: qty 20

## 2022-06-22 MED ORDER — MIDAZOLAM HCL 2 MG/2ML IJ SOLN
INTRAMUSCULAR | Status: AC
  Filled 2022-06-22: qty 2

## 2022-06-22 NOTE — Anesthesia Preprocedure Evaluation (Addendum)
Anesthesia Evaluation  Patient identified by MRN, date of birth, ID band Patient awake    Reviewed: Allergy & Precautions, NPO status , Patient's Chart, lab work & pertinent test results  Airway Mallampati: II  TM Distance: >3 FB Neck ROM: Full    Dental  (+) Edentulous Upper, Edentulous Lower   Pulmonary asthma , sleep apnea , COPD,  COPD inhaler and oxygen dependent, Current Smoker and Patient abstained from smoking.   Pulmonary exam normal        Cardiovascular hypertension, + CAD, + Peripheral Vascular Disease and +CHF  Normal cardiovascular exam+ dysrhythmias      Neuro/Psych  Headaches, Seizures -,  PSYCHIATRIC DISORDERS Anxiety Depression Bipolar Disorder   TIA Neuromuscular disease CVA, Residual Symptoms    GI/Hepatic negative GI ROS, Neg liver ROS,,,  Endo/Other  diabetes, Insulin Dependent    Renal/GU Renal disease     Musculoskeletal negative musculoskeletal ROS (+)    Abdominal   Peds  Hematology negative hematology ROS (+)   Anesthesia Other Findings mediastinal adenopathy  Reproductive/Obstetrics                              Anesthesia Physical Anesthesia Plan  ASA: 4  Anesthesia Plan: General   Post-op Pain Management:    Induction: Intravenous  PONV Risk Score and Plan: 2 and Ondansetron, Dexamethasone and Treatment may vary due to age or medical condition  Airway Management Planned: Oral ETT  Additional Equipment:   Intra-op Plan:   Post-operative Plan: Extubation in OR  Informed Consent: I have reviewed the patients History and Physical, chart, labs and discussed the procedure including the risks, benefits and alternatives for the proposed anesthesia with the patient or authorized representative who has indicated his/her understanding and acceptance.   Patient has DNR.  Discussed DNR with patient.     Plan Discussed with: CRNA  Anesthesia Plan Comments:  (Patient requests no defibrillation )         Anesthesia Quick Evaluation

## 2022-06-22 NOTE — Anesthesia Postprocedure Evaluation (Signed)
Anesthesia Post Note  Patient: [name]  Procedure(s) Performed: ENDOBRONCHIAL ULTRASOUND (Bilateral) BRONCHIAL NEEDLE ASPIRATION BIOPSIES     Patient location during evaluation: Endoscopy Anesthesia Type: General Level of consciousness: awake Pain management: pain level controlled Vital Signs Assessment: post-procedure vital signs reviewed and stable Respiratory status: spontaneous breathing, nonlabored ventilation and respiratory function stable Cardiovascular status: blood pressure returned to baseline and stable Postop Assessment: no apparent nausea or vomiting Anesthetic complications: no   No notable events documented.  Last Vitals:  Vitals:   06/22/22 1440 06/22/22 1448  BP: (!) 167/58 (!) 154/69  Pulse: 78 78  Resp: 20 20  Temp:    SpO2: 97% 97%    Last Pain:  Vitals:   06/22/22 1448  TempSrc:   PainSc: 0-No pain                 Silvestre Mines P Suvi Archuletta

## 2022-06-22 NOTE — Anesthesia Procedure Notes (Signed)
Procedure Name: Intubation Date/Time: 06/22/2022 1:17 PM  Performed by: Montel Clock, CRNAPre-anesthesia Checklist: Patient identified, Emergency Drugs available, Suction available, Patient being monitored and Timeout performed Patient Re-evaluated:Patient Re-evaluated prior to induction Oxygen Delivery Method: Circle system utilized Preoxygenation: Pre-oxygenation with 100% oxygen Induction Type: IV induction Ventilation: Mask ventilation without difficulty Laryngoscope Size: Mac and 3 Grade View: Grade I Tube type: Oral Tube size: 8.5 mm Number of attempts: 1 Airway Equipment and Method: Stylet Placement Confirmation: ETT inserted through vocal cords under direct vision, positive ETCO2 and breath sounds checked- equal and bilateral Secured at: 21 cm Tube secured with: Tape Dental Injury: Teeth and Oropharynx as per pre-operative assessment

## 2022-06-22 NOTE — Transfer of Care (Signed)
Immediate Anesthesia Transfer of Care Note  Patient: [name]  Procedure(s) Performed: ENDOBRONCHIAL ULTRASOUND (Bilateral) BRONCHIAL NEEDLE ASPIRATION BIOPSIES  Patient Location: Endoscopy Unit  Anesthesia Type:General  Level of Consciousness: drowsy and patient cooperative  Airway & Oxygen Therapy: Patient Spontanous Breathing and Patient connected to face mask oxygen  Post-op Assessment: Report given to RN and Post -op Vital signs reviewed and stable  Post vital signs: Reviewed and stable  Last Vitals:  Vitals Value Taken Time  BP 185/66 06/22/22 1415  Temp    Pulse 84 06/22/22 1417  Resp 18 06/22/22 1417  SpO2 100 % 06/22/22 1417  Vitals shown include unvalidated device data.  Last Pain:  Vitals:   06/22/22 1200  TempSrc: Temporal  PainSc:       Patients Stated Pain Goal: 0 (18/48/59 2763)  Complications: No notable events documented.

## 2022-06-22 NOTE — Op Note (Signed)
[facility] Cardiopulmonary Patient Name: [name] Procedure Date: 06/22/2022 MRN: [id number] Attending MD: Laurin Coder MD, MD, [id number] Date of Birth: 15-Nov-1970 CSN: [id number] Age: 51 Admit Type: Inpatient Ethnicity: Not Hispanic or Latino Procedure:             Bronchoscopy Indications:           Suspicious hilum lesion, Suspicious mediastinum                         lesion, Mediastinal adenopathy, Hilar lymphadenopathy                         of the right side, Paratracheal adenopathy Providers:             Arch Methot A. Ander Slade MD, MD, Jaci Carrel, RN,                         Luan Moore, Technician Referring MD:           Medicines:             See the Anesthesia note for documentation of the                         administered medications Complications:         No immediate complications Estimated Blood Loss:  Estimated blood loss was minimal. Procedure:      Pre-Anesthesia Assessment:      - The anesthesia plan was to use general anesthesia.      - The heart rate, respiratory rate, oxygen saturations, blood pressure,       adequacy of pulmonary ventilation, and response to care were monitored       throughout the procedure.      After obtaining informed consent, the bronchoscope was passed under       direct vision. Throughout the procedure, the patient's blood pressure,       pulse, and oxygen saturations were monitored continuously. the [id number]       ([id number]) Olympus bronoscope was introduced through the mouth, via the       endotracheal tube (the patient was intubated for the procedure) and       advanced to the tracheobronchial tree of both lungs. the Bronchoscope       was introduced through the mouth, via the endotracheal tube (the patient       was intubated for the procedure) and advanced to the tracheobronchial       tree of both lungs. The procedure was accomplished without difficulty.       The patient tolerated the  procedure well. Findings:      The scope was withdrawn and replaced with the EBUS bronchoscope to       accomplish the ultrasound examination.      Lymph Nodes: An endobronchial ultrasound endoscope was utilized to       systematically examine the right lower paratracheal region (level 4R),       subcarinal mediastinum (level 7) and right hilar region (level 10R) in       order to assist with fine needle aspiration. Lymph node staging was       performed via endobronchial ultrasound for suspected metastatic cancer.      Lymph Nodes: An endobronchial ultrasound-guided transbronchial needle       aspiration was performed using an Olympus ViziShot  21 gauge needle and       sent for routine cytology.      Rapid On-Site Evaluation (ROSE): Preliminary cytology was suggestive of       atypical cells (final results are pending) in the right hilar region       (level 10R). Preliminary cytology was suggestive of atypical cells       (final results are pending) in the subcarinal mediastinum (level 7).       samples taken from 4R lumph node Impression:      - Suspicious hilum lesion      - Suspicious mediastinum lesion      - Mediastinal adenopathy      - Hilar lymphadenopathy of the right side      - Paratracheal adenopathy      - Endobronchial ultrasound was performed.      - Systematic lymph node staging was performed.      - A transbronchial needle aspiration was performed.      - Rapid On-Site Evaluation (ROSE): Preliminary cytology was suggestive       suggestive of atypical cells in node level 10R and suggestive of       atypical cells in node level 7 (final results are pending). Moderate Sedation:      An independent trained observer was present and continuously monitored       the patient. Recommendation:      - Await cytology results. Procedure Code(s):      --- Professional ---      [phone], Bronchoscopy, rigid or flexible, including fluoroscopic guidance,       when performed; with  endobronchial ultrasound (EBUS) guided       transtracheal and/or transbronchial sampling (eg,       aspiration[s]/biopsy[ies]), 3 or more mediastinal and/or hilar lymph       node stations or structures      89381, Bronchoscopy, rigid or flexible, including fluoroscopic guidance,       when performed; with transendoscopic endobronchial ultrasound (EBUS)       during bronchoscopic diagnostic or therapeutic intervention(s) for       peripheral lesion(s) (List separately in addition to code for primary       procedure[s]) Diagnosis Code(s):      --- Professional ---      R91.8, Other nonspecific abnormal finding of lung field      R93.89, Abnormal findings on diagnostic imaging of other specified body       structures      R59.0, Localized enlarged lymph nodes      R09.89, Other specified symptoms and signs involving the circulatory and       respiratory systems CPT copyright 2022 [employer]. All rights reserved. The codes documented in this report are preliminary and upon coder review may  be revised to meet current compliance requirements. Sherrilyn Rist, MD Laurin Coder MD, MD 06/22/2022 2:15:51 PM This report has been signed electronically. Number of Addenda: 0 Scope In: Scope Out:
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
## 2022-06-22 NOTE — Progress Notes (Signed)
[facility] Progress Note Patient: [name] KXF:[id number] DOB: [birth year] DOA: 06/17/2022  DOS: the patient was seen and examined on 06/22/2022  Brief hospital course: [name] is a 51 yo female with PMH right-sided breast cancer (on anastrozole and Lupron), asthma, anxiety, CAD, diastolic CHF, CKD, COPD, DM II, HTN, HLD, seizures, CVA. She presented to the ER with altered mentation.  She was found to have hypercalcemia on admission (corrected calcium, 15.3 mg/dL).  CT chest/abdomen/pelvis on admission notable for lytic lesion involving right femoral neck consistent with bony metastatic disease.  Also noted soft tissue density at the right hilum concerning for possible neoplasm.  Underwent bronchoscopy with biopsy on 12/13. Assessment and Plan: Hypercalcemia. Likely related to malignancy. Calcium level improving with IV hydration. Patient was treated with Zometa and calcitonin. Outpatient follow-up with oncology recommended closely.  Acute metabolic encephalopathy. Mentation improving. Appears to be close to baseline. Unable to reach the hospital on the phone. Monitor.  Concern for UTI. Resolved. Treated with Rocephin. Monitor.  Right breast cancer. ER positive. Outpatient follow-up with oncology. Found to have a pulmonary soft tissue mass. Underwent bronchoscopy on 12/13. Outpatient follow-up with oncology.  Lytic lesions of the right female. Management per oncology. Currently no pain.  Chronic pain syndrome. Bipolar disorder. Home medications currently are adjusted. Full monitor.  HLD. Hold statin.  Type 2 diabetes mellitus. Hemoglobin A1c 10.3 in August. Continue sliding scale insulin for now.  CAD. Chronic HFpEF. Appears to be euvolemic. Aspirin started on hold.  Will resume.  Obesity.  Possible. Body mass index is 32.06 kg/m.  BMI could be elevated secondary to history of BKA bilaterally Placing the patient at high risk for poor outcome.    Subjective: No nausea no vomiting.  No fever no chills.  Physical Exam: General: in mild distress;  Cardiovascular: S1 and S2 Present, no Murmur Respiratory: normal respiratory effort, Bilateral Air entry present, no Crackles, no wheezes Abdomen: Bowel Sound present, Non tender  Extremities: bilateral BKA Neurology: alert and oriented to time, place, and person   Data Reviewed: I have Reviewed nursing notes, Vitals, and Lab results. Since last encounter, pertinent lab results CBC and BMP   . I have ordered test including CBC and BMP  .   Disposition: Status is: Inpatient Remains inpatient appropriate because: Monitor post bronchoscopy and monitor for improvement in mentation.  enoxaparin (LOVENOX) injection 40 mg Start: 06/18/22 1300   Family Communication: Unable to reach husband on the phone.  Unable to leave a voicemail. Level of care: Progressive Continue progressing for now.  Transition to MedSurg tomorrow. Vitals:   06/22/22 1430 06/22/22 1440 06/22/22 1448 06/22/22 1521  BP: (!) 164/63 (!) 167/58 (!) 154/69 (!) 157/71  Pulse: 80 78 78 78  Resp: '15 20 20 18  '$ Temp:    98.6 F (37 C)  TempSrc:    Oral  SpO2: 97% 97% 97% 99%  Weight:      Height:         Author: Berle Mull, MD 06/22/2022 7:57 PM  Please look on [email or web address] to find out who is on call.

## 2022-06-23 ENCOUNTER — Other Ambulatory Visit (INDEPENDENT_AMBULATORY_CARE_PROVIDER_SITE_OTHER): Payer: Self-pay | Admitting: Vascular Surgery

## 2022-06-23 LAB — GLUCOSE, CAPILLARY
Glucose-Capillary: 122 mg/dL — ABNORMAL HIGH (ref 70–99)
Glucose-Capillary: 152 mg/dL — ABNORMAL HIGH (ref 70–99)
Glucose-Capillary: 152 mg/dL — ABNORMAL HIGH (ref 70–99)

## 2022-06-23 LAB — COMPREHENSIVE METABOLIC PANEL
ALT: 12 U/L (ref 0–44)
AST: 20 U/L (ref 15–41)
Albumin: 2.4 g/dL — ABNORMAL LOW (ref 3.5–5.0)
Alkaline Phosphatase: 75 U/L (ref 38–126)
Anion gap: 6 (ref 5–15)
BUN: 21 mg/dL — ABNORMAL HIGH (ref 6–20)
CO2: 19 mmol/L — ABNORMAL LOW (ref 22–32)
Calcium: 8.9 mg/dL (ref 8.9–10.3)
Chloride: 110 mmol/L (ref 98–111)
Creatinine, Ser: 0.69 mg/dL (ref 0.44–1.00)
GFR, Estimated: >60 mL/min (ref >60)
Glucose, Bld: 112 mg/dL — ABNORMAL HIGH (ref 70–99)
Potassium: 3.1 mmol/L — ABNORMAL LOW (ref 3.5–5.1)
Sodium: 135 mmol/L (ref 135–145)
Total Bilirubin: 0.6 mg/dL (ref 0.3–1.2)
Total Protein: 6.2 g/dL — ABNORMAL LOW (ref 6.5–8.1)

## 2022-06-23 LAB — CBC WITH DIFFERENTIAL/PLATELET
Abs Immature Granulocytes: 0.06 10*3/uL (ref 0.00–0.07)
Basophils Absolute: 0.1 10*3/uL (ref 0.0–0.1)
Basophils Relative: 1 %
Eosinophils Absolute: 0.3 10*3/uL (ref 0.0–0.5)
Eosinophils Relative: 2 %
HCT: 34.2 % — ABNORMAL LOW (ref 36.0–46.0)
Hemoglobin: 11.8 g/dL — ABNORMAL LOW (ref 12.0–15.0)
Immature Granulocytes: 1 %
Lymphocytes Relative: 17 %
Lymphs Abs: 1.9 10*3/uL (ref 0.7–4.0)
MCH: 28 pg (ref 26.0–34.0)
MCHC: 34.5 g/dL (ref 30.0–36.0)
MCV: 81.2 fL (ref 80.0–100.0)
Monocytes Absolute: 1 10*3/uL (ref 0.1–1.0)
Monocytes Relative: 9 %
Neutro Abs: 7.9 10*3/uL — ABNORMAL HIGH (ref 1.7–7.7)
Neutrophils Relative %: 70 %
Platelets: 213 10*3/uL (ref 150–400)
RBC: 4.21 MIL/uL (ref 3.87–5.11)
RDW: 14.6 % (ref 11.5–15.5)
WBC: 11.2 10*3/uL — ABNORMAL HIGH (ref 4.0–10.5)
nRBC: 0 % (ref 0.0–0.2)

## 2022-06-23 LAB — MAGNESIUM: Magnesium: 2.2 mg/dL (ref 1.7–2.4)

## 2022-06-23 MED ORDER — POTASSIUM CHLORIDE CRYS ER 20 MEQ PO TBCR
40.0000 meq | EXTENDED_RELEASE_TABLET | ORAL | Status: AC
  Administered 2022-06-23: 40 meq via ORAL
  Filled 2022-06-23: qty 2

## 2022-06-23 MED ORDER — TOPIRAMATE 50 MG PO TABS: 50.0000 mg | ORAL_TABLET | Freq: Every day | ORAL | 0 refills | Status: AC

## 2022-06-23 MED ORDER — PREGABALIN 50 MG PO CAPS: 50.0000 mg | ORAL_CAPSULE | Freq: Two times a day (BID) | ORAL | 0 refills | Status: AC

## 2022-06-23 MED ORDER — NEPRO PO LIQD: 1.0000 | Freq: Two times a day (BID) | ORAL | 0 refills | Status: AC

## 2022-06-23 MED ORDER — RENA-VITE PO TABS: 1.0000 | ORAL_TABLET | Freq: Every day | ORAL | 0 refills | Status: AC

## 2022-06-23 MED ORDER — PREGABALIN 75 MG PO CAPS: 75.0000 mg | ORAL_CAPSULE | Freq: Two times a day (BID) | ORAL | 0 refills | Status: AC

## 2022-06-23 NOTE — Progress Notes (Signed)
Post EBUS bronchoscopy 06/22/2022  Results pending  Follow-up with oncology as outpatient  May be discharged from my perspective

## 2022-06-23 NOTE — Plan of Care (Signed)
Patient bathed and new fluids hung for iv.  Husband at bedside assisting with care and distraction of patient.  Patient continues to make comments that indicate  hallucinations.  Answers questions appropriately.

## 2022-06-23 NOTE — TOC Transition Note (Signed)
Transition of Care [facility]) - CM/SW Discharge Note   Patient Details  Name: [name] MRN: [id number] Date of Birth: 20-Apr-1971  Transition of Care [facility]) CM/SW Contact:  Leeroy Cha, RN Phone Number: 06/23/2022, 10:22 AM   Clinical Narrative:    [id number] discharged to return home.  Chart reviewed for TOC needs.  None found.  Patient self care.   Final next level of care: Home/Self Care Barriers to Discharge: Barriers Resolved   Patient Goals and CMS Choice Patient states their goals for this hospitalization and ongoing recovery are:: to go home CMS Medicare.gov Compare Post Acute Care list provided to:: Legal Guardian Choice offered to / list presented to : [place] / [place]  Discharge Placement                       Discharge Plan and Services   Discharge Planning Services: CM Consult                                 Social Determinants of Health (SDOH) Interventions Housing Interventions: Intervention Not Indicated   Readmission Risk Interventions   Row Labels 04/01/2022    3:50 PM 12/14/2021   10:32 AM  Readmission Risk Prevention Plan   Section Header. No data exists in this row.    Transportation Screening   Complete   Medication Review [employer])   Complete Complete  PCP or Specialist appointment within 3-5 days of discharge    Complete  HRI or [place]   Complete Complete  SW Recovery Care/Counseling Consult   Complete Complete  Palliative Care Screening   Not Applicable Not [place]   Not Applicable Not Applicable

## 2022-06-24 ENCOUNTER — Telehealth: Payer: Self-pay | Admitting: *Deleted

## 2022-06-24 LAB — CYTOLOGY - NON PAP

## 2022-06-24 LAB — PTH-RELATED PEPTIDE: PTH-related peptide: 4.3 pmol/L — ABNORMAL HIGH

## 2022-06-24 NOTE — Discharge Summary (Signed)
Physician Discharge Summary   Patient: [name] MRN: [id number] DOB: [birth year]  Admit date:     06/17/2022  Discharge date: {dischdate:26783}  Discharge Physician: Berle Mull  PCP: Pleas Koch, NP  Recommendations at discharge:  ***   Follow-up Information     Pleas Koch, NP. Schedule an appointment as soon as possible for a visit in 1 week(s).   Specialty: Internal Medicine Why: with CBC and BMP Contact information: [address] [place] [address] [phone]         Cammie Sickle, MD. Schedule an appointment as soon as possible for a visit in 1 week(s).   Specialties: Internal Medicine, Oncology Contact information: [place] [address] [phone]         Laurin Coder, MD. Call.   Specialty: Pulmonary Disease Why: As needed Contact information: [address] [place] [address] [phone]                Discharge Diagnoses: Principal Problem:   Hypercalcemia Active Problems:   Acute metabolic encephalopathy   Lytic bone lesion of right femur   Carcinoma of lower-outer quadrant of right breast in female, estrogen receptor positive ([place])   COPD with chronic bronchitis   Bipolar disorder (HCC)   Chronic heart failure with preserved ejection fraction (HFpEF) (HCC)   Coronary artery disease involving native heart without angina pectoris   PAD (peripheral artery disease) (HCC)   Hilar density   Hyperlipidemia   HTN (hypertension)   DM2 (diabetes mellitus, type 2) [facility])  Hospital Course: [name] is a 51 yo female with PMH right-sided breast cancer (on anastrozole and Lupron), asthma, anxiety, CAD, diastolic CHF, CKD, COPD, DM II, HTN, HLD, seizures, CVA. She presented to the ER with altered mentation.  She was found to have hypercalcemia on admission (corrected calcium, 15.3 mg/dL).  CT chest/abdomen/pelvis on admission notable for lytic lesion involving right femoral  neck consistent with bony metastatic disease.  Also noted soft tissue density at the right hilum concerning for possible neoplasm.  Underwent bronchoscopy with biopsy on 12/13. Assessment and Plan  Principal Problem:   Hypercalcemia Active Problems:   Acute metabolic encephalopathy   Lytic bone lesion of right femur   Carcinoma of lower-outer quadrant of right breast in female, estrogen receptor positive (HCC)   COPD with chronic bronchitis   Bipolar disorder (HCC)   Chronic heart failure with preserved ejection fraction (HFpEF) (HCC)   Coronary artery disease involving native heart without angina pectoris   PAD (peripheral artery disease) (HCC)   Hilar density   Hyperlipidemia   HTN (hypertension)   DM2 (diabetes mellitus, type 2) (HCC)    {Tip this will not be part of the note when signed Body mass index is 33.22 kg/m. ,  Nutrition Documentation    Flowsheet Row ED to Hosp-Admission (Discharged) from 06/17/2022 in [facility]  Nutrition Problem Increased nutrient needs  Etiology wound healing  Nutrition Goal Patient will meet greater than or equal to 90% of their needs  Interventions MVI, Premier Protein     ,  Active Pressure Injury/Wound(s)     Pressure Ulcer  Duration          Pressure Injury 03/29/22 Buttocks Right Stage 2 -  Partial thickness loss of dermis presenting as a shallow open injury with a red, pink wound bed without slough. see nursing note at [facility] to the floor [place]  updated FS 87 days   Pressure Injury 06/18/22 Sacrum Stage 1 -  Intact skin with non-blanchable redness of a localized area usually over a bony prominence. 6 days           (Optional):26781}  {(NOTE) Pain control PDMP Statment (Optional):26782} Consultants:  ***  Procedures performed:  ***  DISCHARGE MEDICATION: Allergies as of 06/23/2022       Reactions   Adhesive [tape] Rash, Other (See Comments)   TAKES OFF THE SKIN (CERTAIN MEDICAL  TAPES DO THIS!!)   Metoprolol Shortness Of Breath   Occurrence of shortness of breath after 3 days   Montelukast Shortness Of Breath   Morphine Sulfate Anaphylaxis, Shortness Of Breath, Nausea And Vomiting   Swollen Throat - Able to tolerate dilaudid   Penicillins Anaphylaxis, Hives, Shortness Of Breath   Throat swells Has patient had a PCN reaction causing immediate rash, facial/tongue/throat swelling, SOB or lightheadedness with hypotension: Yes Has patient had a PCN reaction causing severe rash involving mucus membranes or skin necrosis: No Has patient had a PCN reaction that required hospitalization: Yes Has patient had a PCN reaction occurring within the last 10 years: No If all of the above answers are "NO", then may proceed with Cephalosporin use.   Silicone Rash   Takes off the skin (certain medical tapes do this)   Diltiazem Swelling   Gabapentin Swelling   Midodrine    Lightheaded and falling down   Percocet [oxycodone-acetaminophen] Rash        Medication List     STOP taking these medications    busPIRone 30 MG tablet Commonly known as: BUSPAR   oxyCODONE 5 MG immediate release tablet Commonly known as: Oxy IR/ROXICODONE   rOPINIRole 1 MG tablet Commonly known as: REQUIP   traZODone 100 MG tablet Commonly known as: DESYREL       TAKE these medications    acetaminophen 325 MG tablet Commonly known as: TYLENOL Take 2 tablets (650 mg total) by mouth every 4 (four) hours as needed for headache or mild pain.   albuterol 108 (90 Base) MCG/ACT inhaler Commonly known as: VENTOLIN HFA Inhale 2 puffs into the lungs every 6 (six) hours as needed for wheezing or shortness of breath.   ALPRAZolam 1 MG tablet Commonly known as: XANAX Take 1 tablet (1 mg total) by mouth 4 (four) times daily as needed for anxiety. What changed:  when to take this additional instructions   anastrozole 1 MG tablet Commonly known as: ARIMIDEX Take 1 mg by mouth daily.    aspirin EC 81 MG tablet Take 81 mg by mouth daily before breakfast.   atorvastatin 10 MG tablet Commonly known as: LIPITOR Take 1 tablet (10 mg total) by mouth daily. For cholesterol   budesonide-formoterol 80-4.5 MCG/ACT inhaler Commonly known as: Symbicort INHALE 2 PUFFS BY MOUTH EVERY 12 HOURS TO PREVENT COUGH OR WHEEZING *RINSE MOUTH AFTER EACH USE* What changed:  how much to take how to take this when to take this reasons to take this additional instructions   desvenlafaxine 100 MG 24 hr tablet Commonly known as: PRISTIQ Take 100 mg by mouth daily.   insulin aspart 100 UNIT/ML FlexPen Commonly known as: NOVOLOG Inject 10 units into the skin before breakfast and inject 10 units into the skin before dinner for diabetes.   insulin glargine 100 UNIT/ML Solostar Pen Commonly known as: LANTUS Inject 20 Units into the skin daily.   multivitamin Tabs tablet Take 1 tablet by mouth daily.  naloxone 4 MG/0.1ML Liqd nasal spray kit Commonly known as: NARCAN Place 0.4 mg into the nose once.   Nepro Liqd Take 1 each by mouth 2 (two) times daily.   nicotine 14 mg/24hr patch Commonly known as: NICODERM CQ - dosed in mg/24 hours 14 mg daily.   ondansetron 8 MG tablet Commonly known as: ZOFRAN One pill every 8 hours as needed for nausea/vomitting. What changed:  how much to take how to take this when to take this reasons to take this additional instructions   OXYGEN Inhale 2-4 L into the lungs 2 (two) times daily as needed (shortness of breath).   pregabalin 75 MG capsule Commonly known as: LYRICA Take 1 capsule (75 mg total) by mouth 2 (two) times daily for 5 days. What changed:  medication strength how much to take   pregabalin 50 MG capsule Commonly known as: Lyrica Take 1 capsule (50 mg total) by mouth 2 (two) times daily. Start taking on: June 29, 2022 What changed: You were already taking a medication with the same name, and this prescription was  added. Make sure you understand how and when to take each.   spironolactone 50 MG tablet Commonly known as: ALDACTONE Take 50 mg by mouth daily.   Synjardy XR 25-1000 MG Tb24 Generic drug: Empagliflozin-metFORMIN HCl ER Take 1 tablet by mouth daily. For diabetes.   topiramate 50 MG tablet Commonly known as: TOPAMAX Take 1 tablet (50 mg total) by mouth daily. What changed: when to take this       Disposition: {comingfrom:22515} Diet recommendation: {dietplan:22518}  Discharge Exam: Vitals:   06/22/22 2116 06/22/22 2117 06/23/22 0403 06/23/22 0408  BP: (!) 176/75 (!) 176/72 (!) 152/66   Pulse: 92 89 76   Resp: 20  18   Temp: 98.6 F (37 C)  97.8 F (36.6 C)   TempSrc: Oral  Oral   SpO2: 100%  99%   Weight:    74.6 kg  Height:       General: Appear in {DEGREE - MILD, MOD, SEV:22033} distress; no visible Abnormal Neck Mass Or lumps, Conjunctiva normal Cardiovascular: S1 and S2 Present, *** Murmur, Respiratory: {Desc; increased/descreased:10091} respiratory effort, Bilateral Air entry present and ***CTA, *** Crackles, *** wheezes Abdomen: Bowel Sound present, *** Extremities: *** Pedal edema Neurology: {EXAM; NEURO ALERTNESS:23097} and {orientationexam:27336} *** Filed Weights   06/21/22 0500 06/22/22 0500 06/23/22 0408  Weight: 72.1 kg 72 kg 74.6 kg   Condition at discharge: stable  The results of significant diagnostics from this hospitalization (including imaging, microbiology, ancillary and laboratory) are listed below for reference.   Imaging Studies: CT Chest W Contrast  Result Date: 06/17/2022 CLINICAL DATA:  History of right breast cancer, abnormal appearance of the right hilum on abdominal CT EXAM: CT CHEST WITH CONTRAST TECHNIQUE: Multidetector CT imaging of the chest was performed during intravenous contrast administration. RADIATION DOSE REDUCTION: This exam was performed according to the departmental dose-optimization program which includes automated  exposure control, adjustment of the mA and/or kV according to patient size and/or use of iterative reconstruction technique. CONTRAST:  138m OMNIPAQUE IOHEXOL 300 MG/ML  SOLN COMPARISON:  05/26/2022, 05/25/2022, 03/08/2021 FINDINGS: Cardiovascular: The the heart is unremarkable without pericardial effusion. Pericardial calcifications and coronary artery atherosclerosis unchanged. No evidence of thoracic aortic aneurysm or dissection. Atherosclerosis of the aortic arch unchanged. Mediastinum/Nodes: Pathologic mediastinal and right hilar adenopathy is noted, which has progressed significantly since the 03/08/2021 exam. Index lymph nodes are as follows: Precarinal, image 52/2, 21 mm short  axis.  18 mm on 05/25/2022. Subcarinal, image 66/2, 16 mm short axis.  Stable. Right hilum, image 63/2, 20 mm. New since 2022, and difficult to visualize on unenhanced exam 05/25/2022. Nodularity in the right infrahilar region reference image 79/2 measures 16 mm, and could reflect additional adenopathy or pulmonary malignancy. No axillary adenopathy. Thyroid, trachea, and esophagus are stable. Lungs/Pleura: Nonspecific bilateral pulmonary nodules are again identified. Largest nodule within the right lower lobe measures 8 x 7 mm reference image 96/7, which appears slightly increased in size since the 2022 exam where 2 contiguous smaller nodules were visualized in this location. Numerous other subcentimeter nodules throughout the remainder of the lungs are stable, index lesion right upper lobe image 64/7 measuring 6 mm. No effusion or pneumothorax. There is extrinsic narrowing of the right lower lobe lateral basilar segmental bronchus due to the right hilar adenopathy/mass described above. Upper Abdomen: No acute abnormality. Musculoskeletal: There are no acute or destructive bony lesions. Prior healed left rib fractures are again noted. Reconstructed images demonstrate no additional findings. IMPRESSION: 1. Abnormal mediastinal and  right hilar adenopathy as above, consistent with metastatic disease. Nodularity in the right infrahilar region along the course of the right lower lobe lateral segmental bronchus could reflect additional adenopathy or bronchogenic malignancy. 2. Scattered pulmonary nodules as above, with increase in size of a right lower lobe pulmonary nodule since prior 2022 exam. These may be inflammatory/infectious or neoplastic. 3. Aortic Atherosclerosis (ICD10-I70.0). Coronary artery atherosclerosis. 4. Please refer to CT abdomen and pelvis report performed previously 4 findings in that region. Electronically Signed   By: Randa Ngo M.D.   On: 06/17/2022 16:48   CT ABDOMEN PELVIS WO CONTRAST  Result Date: 06/17/2022 CLINICAL DATA:  History of right breast cancer, abdominal pain, nausea/vomiting/diarrhea 2 days ago, weakness EXAM: CT ABDOMEN AND PELVIS WITHOUT CONTRAST TECHNIQUE: Multidetector CT imaging of the abdomen and pelvis was performed following the standard protocol without IV contrast. Unenhanced CT was performed per clinician order. Lack of IV contrast limits sensitivity and specificity, especially for evaluation of abdominal/pelvic solid viscera. RADIATION DOSE REDUCTION: This exam was performed according to the departmental dose-optimization program which includes automated exposure control, adjustment of the mA and/or kV according to patient size and/or use of iterative reconstruction technique. COMPARISON:  05/25/2022, 03/29/2022, 03/08/2021 FINDINGS: Lower chest: Scattered bibasilar pulmonary nodules are unchanged since recent examinations, index lesion right lower lobe image 23/7 measuring 8 mm and unchanged. Differential remains inflammatory change versus neoplasm. Continued attention on follow-up is recommended. There is abnormal soft tissue density in the right infrahilar region, resulting in extrinsic narrowing of right lower lobe segmental bronchi. This measures up to 1.7 cm. In a patient with a  known history of new mediastinal lymphadenopathy and history of tobacco abuse, neoplasm is suspected. Correlation with CT chest with IV contrast is recommended. No airspace disease, effusion, or pneumothorax. Stable coronary artery atherosclerosis and pericardial calcifications. Hepatobiliary: Unremarkable unenhanced appearance of the liver and gallbladder. No biliary duct dilation. Pancreas: Unremarkable unenhanced appearance. Spleen: Unremarkable unenhanced appearance. Adrenals/Urinary Tract: No urinary tract calculi or obstructive uropathy within either kidney. The adrenals and bladder are stable. Stomach/Bowel: No bowel obstruction or ileus. Normal appendix right lower quadrant. Scattered diverticulosis throughout the colon without evidence of acute diverticulitis. No bowel wall thickening or inflammatory change. Vascular/Lymphatic: Aortic atherosclerosis. No enlarged abdominal or pelvic lymph nodes. Reproductive: Uterus and bilateral adnexa are unremarkable. Other: No free fluid or free intraperitoneal gas. No abdominal wall hernia. Musculoskeletal: Prior healed left rib  fractures. There is a lytic focus within the superior aspect of the right femoral neck, best seen on image 94/5, concerning for metastatic disease given history of breast cancer and recent bone scan findings. No evidence of acute fracture. Reconstructed images demonstrate no additional findings. IMPRESSION: 1. No acute intra-abdominal or intrapelvic process to account for the patient's current symptoms. 2. Lytic focus within the superior aspect of the right femoral neck, consistent with bony metastatic disease based on history of breast cancer and recent bone scan findings. 3. Abnormal soft tissue density at the right hilum, with extrinsic narrowing of a right lower lobe segmental bronchus. This findings concerning for underlying neoplasm, and dedicated chest CT with IV contrast is recommended in this patient with a history of breast cancer and  tobacco abuse. 4. Stable indeterminate subcentimeter pulmonary nodules, which may be inflammatory neoplastic. Continued attention on follow-up is recommended. 5. Colonic diverticulosis without diverticulitis. These results were called by telephone at the time of interpretation on 06/17/2022 at 3:23 pm to provider Lacretia Leigh , who verbally acknowledged these results. Electronically Signed   By: Randa Ngo M.D.   On: 06/17/2022 15:32   DG Chest Port 1 View  Result Date: 06/17/2022 CLINICAL DATA:  Cough, lethargy, weakness, history of right breast cancer EXAM: PORTABLE CHEST 1 VIEW COMPARISON:  03/29/2022 FINDINGS: Single frontal view of the chest demonstrates a stable cardiac silhouette. No acute airspace disease, effusion, or pneumothorax. Prior healed left rib fractures. No acute bony abnormalities. IMPRESSION: 1. No acute intrathoracic process. Electronically Signed   By: Randa Ngo M.D.   On: 06/17/2022 15:09   CT Head Wo Contrast  Result Date: 06/17/2022 CLINICAL DATA:  Lethargy, altered level of consciousness, weakness, headache EXAM: CT HEAD WITHOUT CONTRAST TECHNIQUE: Contiguous axial images were obtained from the base of the skull through the vertex without intravenous contrast. RADIATION DOSE REDUCTION: This exam was performed according to the departmental dose-optimization program which includes automated exposure control, adjustment of the mA and/or kV according to patient size and/or use of iterative reconstruction technique. COMPARISON:  03/30/2022 FINDINGS: Brain: No acute infarct or hemorrhage. Lateral ventricles and midline structures are unremarkable. No acute extra-axial fluid collections. No mass effect. Vascular: No hyperdense vessel or unexpected calcification. Skull: Normal. Negative for fracture or focal lesion. Metallic BB abutting the outer table left frontal calvarium unchanged. Sinuses/Orbits: No acute finding. Other: None. IMPRESSION: 1. No acute intracranial process.  Electronically Signed   By: Randa Ngo M.D.   On: 06/17/2022 15:07   NM Bone Scan Whole Body  Result Date: 05/28/2022 CLINICAL DATA:  Breast cancer, invasive, staging, prior to preop systemic therapy breast cancer; ? recurrence. Patient has a history mastectomy in 2021. Status post bilaterally implication in 3716. History of a BB in her scalp. EXAM: NUCLEAR MEDICINE WHOLE BODY BONE SCAN TECHNIQUE: Whole body anterior and posterior images were obtained approximately 3 hours after intravenous injection of radiopharmaceutical. RADIOPHARMACEUTICALS:  22.1 mCi Technetium-77mMDP IV COMPARISON:  CT dated May 25, 2022, March 30, 2022 FINDINGS: There is a single focus of radiotracer uptake in the LEFT frontal bone. There is mild asymmetric uptake within the RIGHT greater trochanter. No suspicious focal radiotracer uptake. There is radiotracer uptake within the sternoclavicular joints, bilateral knees and bilateral shoulders This is favored to be degenerative in etiology. Expected physiologic distribution of radiotracer. IMPRESSION: 1. Focus of increased radiotracer uptake within the LEFT frontal bone and mildly asymmetric increased radiotracer uptake within the RIGHT greater trochanter. This is nonspecific and could  be reactive in etiology given recent negative cross-sectional exams. Recommend attention on follow-up as per clinical protocol. Alternatively, PET-CT could be considered. Electronically Signed   By: Valentino Saxon M.D.   On: 05/28/2022 17:56   CT CHEST WO CONTRAST  Result Date: 05/26/2022 CLINICAL DATA:  Restaging right breast cancer diagnosed in April 2021. Shortness of breath. Question recurrence. * Tracking Code: BO * EXAM: CT CHEST WITHOUT CONTRAST TECHNIQUE: Multidetector CT imaging of the chest was performed following the standard protocol without IV contrast. RADIATION DOSE REDUCTION: This exam was performed according to the departmental dose-optimization program which includes  automated exposure control, adjustment of the mA and/or kV according to patient size and/or use of iterative reconstruction technique. COMPARISON:  Abdominopelvic CT 03/29/2022.  Chest CT 03/08/2021. FINDINGS: Cardiovascular: Age advanced atherosclerosis of coronary arteries with lesser involvement of the aorta and great vessels. No acute vascular findings on noncontrast imaging. Stable chronic pericardial thickening and calcifications. The heart size is normal. Mediastinum/Nodes: Interval enlargement of mediastinal lymph nodes, including a right paratracheal node measuring 1.8 cm on image 58/2 and a subcarinal node measuring 1.6 cm on image 73/2. No axillary or internal mammary adenopathy identified. Hilar assessment is limited by the lack of intravenous contrast, although the hilar contours appear unchanged.Mild diffuse esophageal wall thickening suggested. The thyroid gland and trachea appear unremarkable. Lungs/Pleura: No pleural effusion or pneumothorax. Background centrilobular emphysema with diffuse ground-glass centrilobular nodularity, mildly improved from previous CT. There are scattered ill-defined pulmonary nodules bilaterally, many of which are unchanged. Largest nodule in the right lower lobe measures 9 mm on image 109/3, similar to previous study. There are a few scattered new ill-defined nodules, including a 6 mm right lower lobe nodule on image 97/3, a 7 mm left upper lobe nodule on image 69/3 and a 5 mm left upper lobe nodule on image 81/3. Upper abdomen: The visualized upper abdomen appears stable, without significant findings. Musculoskeletal/Chest wall: There is no chest wall mass or suspicious osseous finding. Old rib fractures are noted on the left. No osseous metastases are identified. There are postsurgical changes from previous right mastectomy. IMPRESSION: 1. Interval enlargement of mediastinal lymph nodes, which could be reactive or secondary to metastatic disease. No axillary or internal  mammary adenopathy. 2. Scattered ill-defined pulmonary nodules bilaterally, many of which are unchanged from previous study. There are a few new ill-defined nodules bilaterally. These are nonspecific, and could be inflammatory or neoplastic. Background smoking-related respiratory bronchiolitis has improved from the previous study. 3. No other evidence of metastatic disease. Management options for the current findings include CT follow-up in 4-6 months versus PET-CT for further evaluation. 4. Age advanced coronary artery disease. Stable chronic pericardial thickening and calcifications. 5. Aortic Atherosclerosis (ICD10-I70.0) and Emphysema (ICD10-J43.9). Electronically Signed   By: Richardean Sale M.D.   On: 05/26/2022 14:34    Microbiology: Results for orders placed or performed during the hospital encounter of 06/17/22  Resp Panel by RT-PCR (Flu A&B, Covid) Anterior Nasal Swab     Status: None   Collection Time: 06/17/22  2:30 PM   Specimen: Anterior Nasal Swab  Result Value Ref Range Status   SARS Coronavirus 2 by RT PCR NEGATIVE NEGATIVE Final    Comment: (NOTE) SARS-CoV-2 target nucleic acids are NOT DETECTED.  The SARS-CoV-2 RNA is generally detectable in upper respiratory specimens during the acute phase of infection. The lowest concentration of SARS-CoV-2 viral copies this assay can detect is 138 copies/mL. A negative result does not preclude SARS-Cov-2 infection and  should not be used as the sole basis for treatment or other patient management decisions. A negative result may occur with  improper specimen collection/handling, submission of specimen other than nasopharyngeal swab, presence of viral mutation(s) within the areas targeted by this assay, and inadequate number of viral copies(<138 copies/mL). A negative result must be combined with clinical observations, patient history, and epidemiological information. The expected result is Negative.  Fact Sheet for Patients:   EntrepreneurPulse.com.au  Fact Sheet for Healthcare Providers:  IncredibleEmployment.be  This test is no t yet approved or cleared by the [place] FDA and  has been authorized for detection and/or diagnosis of SARS-CoV-2 by FDA under an Emergency Use Authorization (EUA). This EUA will remain  in effect (meaning this test can be used) for the duration of the COVID-19 declaration under Section 564(b)(1) of the Act, 21 U.S.C.section 360bbb-3(b)(1), unless the authorization is terminated  or revoked sooner.       Influenza A by PCR NEGATIVE NEGATIVE Final   Influenza B by PCR NEGATIVE NEGATIVE Final    Comment: (NOTE) The Xpert Xpress SARS-CoV-2/FLU/RSV plus assay is intended as an aid in the diagnosis of influenza from Nasopharyngeal swab specimens and should not be used as a sole basis for treatment. Nasal washings and aspirates are unacceptable for Xpert Xpress SARS-CoV-2/FLU/RSV testing.  Fact Sheet for Patients: EntrepreneurPulse.com.au  Fact Sheet for Healthcare Providers: [email or web address]  This test is not yet approved or cleared by the [place] FDA and has been authorized for detection and/or diagnosis of SARS-CoV-2 by FDA under an Emergency Use Authorization (EUA). This EUA will remain in effect (meaning this test can be used) for the duration of the COVID-19 declaration under Section 564(b)(1) of the Act, 21 U.S.C. section 360bbb-3(b)(1), unless the authorization is terminated or revoked.  Performed at [facility], [place] [address] [place], [place] [address]   Urine Culture     Status: Abnormal   Collection Time: 06/17/22  2:30 PM   Specimen: Urine, Clean Catch  Result Value Ref Range Status   Specimen Description   Final    URINE, CLEAN CATCH Performed at [facility], [place] [address] [place], [place] [address]    Special Requests    Final    NONE Performed at [facility], [place] [address], [place], [place] [address]    Culture >=100,000 COLONIES/mL ESCHERICHIA COLI (A)  Final   Report Status 06/21/2022 FINAL  Final   Organism ID, Bacteria ESCHERICHIA COLI (A)  Final      Susceptibility   Escherichia coli - MIC*    AMPICILLIN 8 SENSITIVE Sensitive     CEFAZOLIN <=4 SENSITIVE Sensitive     CEFEPIME <=0.12 SENSITIVE Sensitive     CEFTRIAXONE <=0.25 SENSITIVE Sensitive     CIPROFLOXACIN >=4 RESISTANT Resistant     GENTAMICIN <=1 SENSITIVE Sensitive     IMIPENEM 0.5 SENSITIVE Sensitive     NITROFURANTOIN <=16 SENSITIVE Sensitive     TRIMETH/SULFA >=320 RESISTANT Resistant     AMPICILLIN/SULBACTAM 4 SENSITIVE Sensitive     PIP/TAZO <=4 SENSITIVE Sensitive     * >=100,000 COLONIES/mL ESCHERICHIA COLI  Culture, blood (Routine X 2) w Reflex to ID Panel     Status: None   Collection Time: 06/17/22  4:08 PM   Specimen: BLOOD LEFT HAND  Result Value Ref Range Status   Specimen Description   Final    BLOOD LEFT HAND Performed at [facility], [place] [address],  [place], [place] [address]    Special Requests   Final    BOTTLES DRAWN AEROBIC AND ANAEROBIC Blood Culture results may not be optimal due to an inadequate volume of blood received in culture bottles Performed at [place] [address], [place], [place] [address]    Culture   Final    NO GROWTH 5 DAYS Performed at [facility], [place] [address], [place], [place] [address]    Report Status 06/22/2022 FINAL  Final  Culture, blood (Routine X 2) w Reflex to ID Panel     Status: None   Collection Time: 06/17/22  4:08 PM   Specimen: BLOOD LEFT FOREARM  Result Value Ref Range Status   Specimen Description   Final    BLOOD LEFT FOREARM Performed at [facility], [place] [address], [place], [place] [address]    Special Requests   Final    BOTTLES DRAWN AEROBIC AND ANAEROBIC Blood Culture results may not be  optimal due to an excessive volume of blood received in culture bottles Performed at [place] [address] [place], [place] [address]    Culture   Final    NO GROWTH 5 DAYS Performed at [facility], [place] [address], [place], [place] [address]    Report Status 06/22/2022 FINAL  Final  MRSA Next Gen by PCR, Nasal     Status: Abnormal   Collection Time: 06/18/22  6:10 AM   Specimen: Nasal Mucosa; Nasal Swab  Result Value Ref Range Status   MRSA by PCR Next Gen DETECTED (A) NOT DETECTED Final    Comment: (NOTE) The GeneXpert MRSA Assay (FDA approved for NASAL specimens only), is one component of a comprehensive MRSA colonization surveillance program. It is not intended to diagnose MRSA infection nor to guide or monitor treatment for MRSA infections. Test performance is not FDA approved in patients less than 70 years old. Performed at [facility], [place] [address], [place],  [address]    *Note: Due to a large number of results and/or encounters for the requested time period, some results have not been displayed. A complete set of results can be found in Results Review.   Labs: CBC: Recent Labs  Lab 06/19/22 0502 06/20/22 0438 06/21/22 0450 06/22/22 0434 06/23/22 0440  WBC 12.2* 12.3* 14.2* 11.5* 11.2*  NEUTROABS 10.0* 9.7* 12.0* 8.6* 7.9*  HGB 13.2 12.6 12.9 12.6 11.8*  HCT 38.9 36.5 37.6 36.0 34.2*  MCV 83.3 82.2 81.6 81.4 81.2  PLT 209 191 195 198 834   Basic Metabolic Panel: Recent Labs  Lab 06/17/22 1608 06/17/22 1630 06/19/22 0502 06/20/22 0438 06/21/22 0450 06/22/22 0434 06/23/22 0440  NA 143   < > 140 138 137 138 135  K 3.7   < > 3.2* 2.9* 3.2* 3.2* 3.1*  CL 110   < > 113* 113* 112* 112* 110  CO2 27   < > 21* 17* 16* 18* 19*  GLUCOSE 226*   < > 170* 119* 135* 91 112*  BUN 23*   < > 17 16 22* 21* 21*  CREATININE 1.15*   < > 1.05* 0.85 0.82 0.64 0.69  CALCIUM >15.0*   < > 11.4* 10.2 9.8 9.3 8.9  MG   --   --  1.5* 2.1 1.7 2.4 2.2  PHOS 2.7  --   --   --   --   --   --    < > =  values in this interval not displayed.   Liver Function Tests: Recent Labs  Lab 06/19/22 0502 06/20/22 0438 06/21/22 0450 06/22/22 0434 06/23/22 0440  AST _0 ALT _1 ALKPHOS 74 71 74 74 75  BILITOT 0.9 1.0 0.8 0.8 0.6  PROT 6.8 6.4* 6.3* 6.0* 6.2*  ALBUMIN 2.8* 2.7* 2.5* 2.5* 2.4*   CBG: Recent Labs  Lab 06/22/22 1131 06/22/22 1642 06/22/22 2117 06/23/22 0728 06/23/22 1145  GLUCAP 75 106* 149* 122* 152*  152*    Discharge time spent: {LESS THAN/GREATER THAN:26388} 30 minutes.  Signed: Berle Mull, MD [facility] 06/23/2022

## 2022-06-24 NOTE — Progress Notes (Unsigned)
  Care Coordination  Note  06/24/2022 Name: Jaunita Mikels MRN: [id number] DOB: February 19, 1971  Twylia Oka is a 51 y.o. year old primary care patient of Pleas Koch, NP. I reached out to [employer] by phone today to assist with scheduling a follow up appointment. Kabella Arnell verbally consented to my assistance.       Follow up plan: Unsuccessful telephone outreach attempt made. A HIPAA compliant phone message was left for the patient providing contact information and requesting a return call.   Julian Hy, [place] Direct Dial: [phone]

## 2022-06-24 NOTE — Patient Outreach (Signed)
  Care Coordination TOC Note Transition Care Management Follow-up Telephone Call Date of discharge and from where: [name] [phone] How have you been since you were released from the hospital? I am feeling so much better Any questions or concerns? No  Items Reviewed: Did the pt receive and understand the discharge instructions provided? Yes  Medications obtained and verified? Yes  Other? No  Any new allergies since your discharge? No  Dietary orders reviewed? No Do you have support at home? Yes   Home Care and Equipment/Supplies: Were home health services ordered? no If so, what is the name of the agency? N/a  Has the agency set up a time to come to the patient's home? not applicable Were any new equipment or medical supplies ordered?  n What is the name of the medical supply agency? N/a Were you able to get the supplies/equipment? not applicable Do you have any questions related to the use of the equipment or supplies? No  Functional Questionnaire: (I = Independent and D = Dependent) ADLs: I  Bathing/Dressing- I  Meal Prep- I  Eating- I  Maintaining continence- I  Transferring/Ambulation- I  Managing Meds- I  Follow up appointments reviewed:  PCP Hospital f/u appt confirmed? No   Specialist Hospital f/u appt confirmed? Yes  Dr [name] [phone] 10:15 Are transportation arrangements needed? No  If their condition worsens, is the pt aware to call PCP or go to the Emergency Dept.? Yes Was the patient provided with contact information for the PCP's office or ED? Yes Was to pt encouraged to call back with questions or concerns? Yes  SDOH assessments and interventions completed:   Yes SDOH Interventions Today    Flowsheet Row Most Recent Value  SDOH Interventions   Food Insecurity Interventions Intervention Not Indicated  Housing Interventions Intervention Not Indicated  Transportation Interventions Intervention Not Indicated       Care Coordination  Interventions:  PCP follow up appointment requested   Encounter Outcome:  Pt. Visit Completed    [facility] [place] [facility] [phone]

## 2022-06-26 ENCOUNTER — Encounter (HOSPITAL_COMMUNITY): Payer: Self-pay | Admitting: Pulmonary Disease

## 2022-06-27 NOTE — Progress Notes (Signed)
Per appt notes - pt has been scheduled for hospital f/u for 06/30/2022

## 2022-06-28 LAB — MULTIPLE MYELOMA PANEL, SERUM
Albumin SerPl Elph-Mcnc: 2.6 g/dL — ABNORMAL LOW (ref 2.9–4.4)
Albumin/Glob SerPl: 0.7 (ref 0.7–1.7)
Alpha 1: 0.3 g/dL (ref 0.0–0.4)
Alpha2 Glob SerPl Elph-Mcnc: 0.9 g/dL (ref 0.4–1.0)
B-Globulin SerPl Elph-Mcnc: 1.2 g/dL (ref 0.7–1.3)
Gamma Glob SerPl Elph-Mcnc: 1.4 g/dL (ref 0.4–1.8)
Globulin, Total: 3.8 g/dL (ref 2.2–3.9)
IgA: 562 mg/dL — ABNORMAL HIGH (ref 87–352)
IgG (Immunoglobin G), Serum: 1572 mg/dL (ref 586–1602)
IgM (Immunoglobulin M), Srm: 123 mg/dL (ref 26–217)
Total Protein ELP: 6.4 g/dL (ref 6.0–8.5)

## 2022-06-29 ENCOUNTER — Other Ambulatory Visit: Payer: BC Managed Care – PPO

## 2022-06-30 ENCOUNTER — Telehealth (INDEPENDENT_AMBULATORY_CARE_PROVIDER_SITE_OTHER): Payer: BC Managed Care – PPO | Admitting: Primary Care

## 2022-06-30 ENCOUNTER — Encounter: Payer: Self-pay | Admitting: Primary Care

## 2022-06-30 DIAGNOSIS — E1165 Type 2 diabetes mellitus with hyperglycemia: Secondary | ICD-10-CM | POA: Diagnosis not present

## 2022-06-30 DIAGNOSIS — K5900 Constipation, unspecified: Secondary | ICD-10-CM | POA: Diagnosis not present

## 2022-06-30 DIAGNOSIS — Z794 Long term (current) use of insulin: Secondary | ICD-10-CM

## 2022-06-30 DIAGNOSIS — B3731 Acute candidiasis of vulva and vagina: Secondary | ICD-10-CM | POA: Diagnosis not present

## 2022-06-30 DIAGNOSIS — Z853 Personal history of malignant neoplasm of breast: Secondary | ICD-10-CM

## 2022-06-30 DIAGNOSIS — M899 Disorder of bone, unspecified: Secondary | ICD-10-CM

## 2022-06-30 DIAGNOSIS — N898 Other specified noninflammatory disorders of vagina: Secondary | ICD-10-CM

## 2022-06-30 MED ORDER — INSULIN GLARGINE 100 UNIT/ML SOLOSTAR PEN: 30.0000 [IU] | PEN_INJECTOR | Freq: Every day | SUBCUTANEOUS | 11 refills | Status: AC

## 2022-06-30 MED ORDER — LINACLOTIDE 72 MCG PO CAPS: 72.0000 ug | ORAL_CAPSULE | Freq: Every day | ORAL | 0 refills | Status: AC | PRN

## 2022-06-30 MED ORDER — METFORMIN HCL ER 500 MG PO TB24: 1000.0000 mg | ORAL_TABLET | Freq: Every day | ORAL | 1 refills | Status: AC

## 2022-06-30 MED ORDER — FLUCONAZOLE 150 MG PO TABS: 150.0000 mg | ORAL_TABLET | Freq: Once | ORAL | 0 refills | Status: AC

## 2022-06-30 NOTE — Assessment & Plan Note (Signed)
Reducing dose of Linzess to 72 mcg as needed to avoid significant diarrhea. Discussed this with patient and her husband today.

## 2022-06-30 NOTE — Progress Notes (Signed)
Patient ID: [name], female    DOB: [birth year], 51 y.o.   MRN: [id number]  Virtual visit completed through [place], a video enabled telemedicine application. Due to national recommendations of social distancing due to COVID-19, a virtual visit is felt to be most appropriate for this patient at this time. Reviewed limitations, risks, security and privacy concerns of performing a virtual visit and the availability of in person appointments. I also reviewed that there may be a patient responsible charge related to this service. The patient agreed to proceed.   Patient location: home Provider location: [place] at [facility], office Persons participating in this virtual visit: patient, provider, husband  If any vitals were documented, they were collected by patient at home unless specified below.    LMP  (LMP Unknown)    CC: Hospital Follow Up Subjective:   HPI: [name] is a 51 y.o. female with a significant medical history including TIA, breast cancer, CHF, CAD, uncontrolled type 2 diabetes, COPD, chronic respiratory failure with nocturnal hypoxia, bilateral BKA, encephalopathy, hyperlipidemia presenting on 06/30/2022 for Hospitalization Follow-up  She presented to [facility] long ED via EMS on 06/17/2022 for altered mental status x 24 hours, lethargy, foul-smelling urine, abdominal pain with nausea and vomiting.  Workup in the ED revealed hypercalcemia.  She underwent CT abdomen/pelvis which revealed possible hilar mass and lytic lesion on the right femoral neck.  She underwent CT chest with contrast which revealed right hilar adenopathy consistent with metastatic disease, nodularity in the right infrahilar region of the right lobe concerning for bronchogenic malignancy.  She was treated with IV antibiotics and admitted for further evaluation.  During her hospital stay her hypercalcemia was treated.  GI consulted who recommended IV Protonix with transition to p.o. Protonix.  Symptoms  are not suspected to be secondary to GI etiology so GI signed off.  She underwent bronchoscopy on 06/22/2022.  Hypercalcemia resolved and patient became hemodynamically stable.  She was discharged home on 06/23/2022 with recommendations for oncology and PCP follow-up.  Since her hospital stay she continues to feel weak.  She denies nausea and vomiting.  She has been resting a lot. She did have some constiptaion when returning from the hospital so she took a Linzess pill. The Linzess 145 mcg pill caused about 5 hours of diarrhea.  She also notes groin and vaginal redness and itching that began a few days ago. Her last bowel movement was two days ago which is typical for her.  Her husband and son are checking blood sugars which are running about 170's-225. She has been drinking Ensure Plus drinks recently for nutrition.  Her husband thinks these have a lot of sugar. She is compliant to Lantus 25 units but was taking it "as needed".  Her husband has been providing her with 25 units of Lantus daily since her hospital stay.  She has not been using NovoLog.  She is managed on empagliflozin-metformin daily, her husband has been providing her with this medication daily since prescribed.  She is feeling somewhat down as she was told that they may have to take her right leg and right arm entirely.  She has an appointment scheduled with her oncologist next week.      Relevant past medical, surgical, family and social history reviewed and updated as indicated. Interim medical history since our last visit reviewed. Allergies and medications reviewed and updated. Outpatient Medications Prior to Visit  Medication Sig Dispense Refill   acetaminophen (TYLENOL) 325 MG tablet Take 2  tablets (650 mg total) by mouth every 4 (four) hours as needed for headache or mild pain.     albuterol (VENTOLIN HFA) 108 (90 Base) MCG/ACT inhaler Inhale 2 puffs into the lungs every 6 (six) hours as needed for wheezing or shortness of  breath.     ALPRAZolam (XANAX) 1 MG tablet Take 1 tablet (1 mg total) by mouth 4 (four) times daily as needed for anxiety. (Patient taking differently: Take 1 mg by mouth See admin instructions. Take 1 mg by mouth at bedtime and an additional 1 mg up to three times a day as needed for anxiety) 5 tablet 0   anastrozole (ARIMIDEX) 1 MG tablet Take 1 mg by mouth daily.     aspirin EC 81 MG tablet Take 81 mg by mouth daily before breakfast.     atorvastatin (LIPITOR) 10 MG tablet Take 1 tablet (10 mg total) by mouth daily. For cholesterol 90 tablet 3   budesonide-formoterol (SYMBICORT) 80-4.5 MCG/ACT inhaler INHALE 2 PUFFS BY MOUTH EVERY 12 HOURS TO PREVENT COUGH OR WHEEZING *RINSE MOUTH AFTER EACH USE* (Patient taking differently: Inhale 2 puffs into the lungs 2 (two) times daily as needed (cough or SOB).) 10.2 g 2   desvenlafaxine (PRISTIQ) 100 MG 24 hr tablet Take 100 mg by mouth daily.     multivitamin (RENA-VIT) TABS tablet Take 1 tablet by mouth daily. 60 tablet 0   naloxone (NARCAN) nasal spray 4 mg/0.1 mL Place 0.4 mg into the nose once.     nicotine (NICODERM CQ - DOSED IN MG/24 HOURS) 14 mg/24hr patch 14 mg daily.     Nutritional Supplements (NEPRO) LIQD Take 1 each by mouth 2 (two) times daily. 10000 mL 0   ondansetron (ZOFRAN) 8 MG tablet One pill every 8 hours as needed for nausea/vomitting. (Patient taking differently: Take 8 mg by mouth every 8 (eight) hours as needed for nausea or vomiting.) 40 tablet 0   OXYGEN Inhale 2-4 L into the lungs 2 (two) times daily as needed (shortness of breath).     pregabalin (LYRICA) 50 MG capsule Take 1 capsule (50 mg total) by mouth 2 (two) times daily. 30 capsule 0   spironolactone (ALDACTONE) 50 MG tablet Take 50 mg by mouth daily.     topiramate (TOPAMAX) 50 MG tablet Take 1 tablet (50 mg total) by mouth daily. 30 tablet 0   Empagliflozin-metFORMIN HCl ER (SYNJARDY XR) 25-1000 MG TB24 Take 1 tablet by mouth daily. For diabetes. 90 tablet 0   insulin  aspart (NOVOLOG) 100 UNIT/ML FlexPen Inject 10 units into the skin before breakfast and inject 10 units into the skin before dinner for diabetes. 15 mL 11   insulin glargine (LANTUS) 100 UNIT/ML Solostar Pen Inject 20 Units into the skin daily. 15 mL 11   pregabalin (LYRICA) 75 MG capsule Take 1 capsule (75 mg total) by mouth 2 (two) times daily for 5 days. 10 capsule 0   No facility-administered medications prior to visit.     Per HPI unless specifically indicated in ROS section below Review of Systems Objective:  LMP  (LMP Unknown)   Wt Readings from Last 3 Encounters:  06/23/22 164 lb 7.4 oz (74.6 kg)  05/17/22 220 lb (99.8 kg)  04/27/22 220 lb (99.8 kg)       Physical exam: General: Alert and oriented x 3, no distress, does not appear sickly, appears tired.   Pulmonary: Speaks in complete sentences without increased work of breathing, no cough during visit.  Psychiatric: Normal mood, thought content, and behavior.     Results for orders placed or performed during the hospital encounter of 06/17/22  Resp Panel by RT-PCR (Flu A&B, Covid) Anterior Nasal Swab   Specimen: Anterior Nasal Swab  Result Value Ref Range   SARS Coronavirus 2 by RT PCR NEGATIVE NEGATIVE   Influenza A by PCR NEGATIVE NEGATIVE   Influenza B by PCR NEGATIVE NEGATIVE  Culture, blood (Routine X 2) w Reflex to ID Panel   Specimen: BLOOD LEFT HAND  Result Value Ref Range   Specimen Description      BLOOD LEFT HAND Performed at [facility], [address], [place], [place] [address]    Special Requests      BOTTLES DRAWN AEROBIC AND ANAEROBIC Blood Culture results may not be optimal due to an inadequate volume of blood received in culture bottles Performed at [facility], [place] [address] [place], [place] [address]    Culture      NO GROWTH 5 DAYS Performed at [facility], [place] [address], [place], [place] [address]    Report Status 06/22/2022 FINAL   Culture, blood  (Routine X 2) w Reflex to ID Panel   Specimen: BLOOD LEFT FOREARM  Result Value Ref Range   Specimen Description      BLOOD LEFT FOREARM Performed at [facility], [place] [address] [place], [place] [address]    Special Requests      BOTTLES DRAWN AEROBIC AND ANAEROBIC Blood Culture results may not be optimal due to an excessive volume of blood received in culture bottles Performed at [place] [address], [place], [place] [address]    Culture      NO GROWTH 5 DAYS Performed at [facility], [place] [address] [place], [place] [address]    Report Status 06/22/2022 FINAL   MRSA Next Gen by PCR, Nasal   Specimen: Nasal Mucosa; Nasal Swab  Result Value Ref Range   MRSA by PCR Next Gen DETECTED (A) NOT DETECTED  Urine Culture   Specimen: Urine, Clean Catch  Result Value Ref Range   Specimen Description      URINE, CLEAN CATCH Performed at [facility], [place] [address], [place], [place] [address]    Special Requests      NONE Performed at [facility], [place] [address], [place], [place] [address]    Culture >=100,000 COLONIES/mL ESCHERICHIA COLI (A)    Report Status 06/21/2022 FINAL    Organism ID, Bacteria ESCHERICHIA COLI (A)       Susceptibility   Escherichia coli - MIC*    AMPICILLIN 8 SENSITIVE Sensitive     CEFAZOLIN <=4 SENSITIVE Sensitive     CEFEPIME <=0.12 SENSITIVE Sensitive     CEFTRIAXONE <=0.25 SENSITIVE Sensitive     CIPROFLOXACIN >=4 RESISTANT Resistant     GENTAMICIN <=1 SENSITIVE Sensitive     IMIPENEM 0.5 SENSITIVE Sensitive     NITROFURANTOIN <=16 SENSITIVE Sensitive     TRIMETH/SULFA >=320 RESISTANT Resistant     AMPICILLIN/SULBACTAM 4 SENSITIVE Sensitive     PIP/TAZO <=4 SENSITIVE Sensitive     * >=100,000 COLONIES/mL ESCHERICHIA COLI  Rapid urine drug screen (hospital performed)  Result Value Ref Range   Opiates NONE DETECTED NONE DETECTED   Cocaine NONE DETECTED NONE DETECTED    Benzodiazepines POSITIVE (A) NONE DETECTED   Amphetamines NONE DETECTED NONE DETECTED   Tetrahydrocannabinol POSITIVE (A) NONE  DETECTED   Barbiturates NONE DETECTED NONE DETECTED  Comprehensive metabolic panel  Result Value Ref Range   Sodium 142 135 - 145 mmol/L   Potassium 4.2 3.5 - 5.1 mmol/L   Chloride 109 98 - 111 mmol/L   CO2 24 22 - 32 mmol/L   Glucose, Bld 227 (H) 70 - 99 mg/dL   BUN 20 6 - 20 mg/dL   Creatinine, Ser 1.08 (H) 0.44 - 1.00 mg/dL   Calcium 14.7 (HH) 8.9 - 10.3 mg/dL   Total Protein 8.1 6.5 - 8.1 g/dL   Albumin 3.3 (L) 3.5 - 5.0 g/dL   AST 27 15 - 41 U/L   ALT 19 0 - 44 U/L   Alkaline Phosphatase 88 38 - 126 U/L   Total Bilirubin 1.1 0.3 - 1.2 mg/dL   GFR, Estimated >60 >60 mL/min   Anion gap 9 5 - 15  CBC with Differential/Platelet  Result Value Ref Range   WBC 10.8 (H) 4.0 - 10.5 K/uL   RBC 5.45 (H) 3.87 - 5.11 MIL/uL   Hemoglobin 15.2 (H) 12.0 - 15.0 g/dL   HCT 45.3 36.0 - 46.0 %   MCV 83.1 80.0 - 100.0 fL   MCH 27.9 26.0 - 34.0 pg   MCHC 33.6 30.0 - 36.0 g/dL   RDW 15.2 11.5 - 15.5 %   Platelets 240 150 - 400 K/uL   nRBC 0.0 0.0 - 0.2 %   Neutrophils Relative % 80 %   Neutro Abs 8.7 (H) 1.7 - 7.7 K/uL   Lymphocytes Relative 11 %   Lymphs Abs 1.2 0.7 - 4.0 K/uL   Monocytes Relative 6 %   Monocytes Absolute 0.6 0.1 - 1.0 K/uL   Eosinophils Relative 1 %   Eosinophils Absolute 0.1 0.0 - 0.5 K/uL   Basophils Relative 1 %   Basophils Absolute 0.1 0.0 - 0.1 K/uL   Immature Granulocytes 1 %   Abs Immature Granulocytes 0.05 0.00 - 0.07 K/uL  Urinalysis, Routine w reflex microscopic Urine, In & Out Cath  Result Value Ref Range   Color, Urine YELLOW YELLOW   APPearance HAZY (A) CLEAR   Specific Gravity, Urine 1.018 1.005 - 1.030   pH 6.0 5.0 - 8.0   Glucose, UA >=500 (A) NEGATIVE mg/dL   Hgb urine dipstick SMALL (A) NEGATIVE   Bilirubin Urine NEGATIVE NEGATIVE   Ketones, ur 5 (A) NEGATIVE mg/dL   Protein, ur >=300 (A) NEGATIVE mg/dL   Nitrite  NEGATIVE NEGATIVE   Leukocytes,Ua SMALL (A) NEGATIVE   RBC / HPF 11-20 0 - 5 RBC/hpf   WBC, UA 21-50 0 - 5 WBC/hpf   Bacteria, UA RARE (A) NONE SEEN   Squamous Epithelial / LPF 0-5 0 - 5   WBC Clumps PRESENT   Ethanol  Result Value Ref Range   Alcohol, Ethyl (B) <10 <10 mg/dL  Renal function panel  Result Value Ref Range   Sodium 143 135 - 145 mmol/L   Potassium 3.7 3.5 - 5.1 mmol/L   Chloride 110 98 - 111 mmol/L   CO2 27 22 - 32 mmol/L   Glucose, Bld 226 (H) 70 - 99 mg/dL   BUN 23 (H) 6 - 20 mg/dL   Creatinine, Ser 1.15 (H) 0.44 - 1.00 mg/dL   Calcium >15.0 (HH) 8.9 - 10.3 mg/dL   Phosphorus 2.7 2.5 - 4.6 mg/dL   Albumin 3.2 (L) 3.5 - 5.0 g/dL   GFR, Estimated 57 (L) >60 mL/min   Anion gap 6 5 -  15  Lactic acid, plasma  Result Value Ref Range   Lactic Acid, Venous 1.2 0.5 - 1.9 mmol/L  Lactic acid, plasma  Result Value Ref Range   Lactic Acid, Venous 2.0 (HH) 0.5 - 1.9 mmol/L  PTH, intact and calcium  Result Value Ref Range   PTH 5 (L) 15 - 65 pg/mL   Calcium, Total (PTH) 14.7 8.7 - 10.2 mg/dL   PTH Interp Comment   PTH-related peptide  Result Value Ref Range   PTH-related peptide 4.3 (H) pmol/L  VITAMIN D 25 Hydroxy (Vit-D Deficiency, Fractures)  Result Value Ref Range   Vit D, 25-Hydroxy 7.60 (L) 30 - 100 ng/mL  Hemoglobin and hematocrit, blood  Result Value Ref Range   Hemoglobin 13.3 12.0 - 15.0 g/dL   HCT 40.0 36.0 - 46.0 %  Hemoglobin and hematocrit, blood  Result Value Ref Range   Hemoglobin 11.9 (L) 12.0 - 15.0 g/dL   HCT 34.8 (L) 36.0 - 46.0 %  Comprehensive metabolic panel  Result Value Ref Range   Sodium 145 135 - 145 mmol/L   Potassium 3.0 (L) 3.5 - 5.1 mmol/L   Chloride 115 (H) 98 - 111 mmol/L   CO2 21 (L) 22 - 32 mmol/L   Glucose, Bld 176 (H) 70 - 99 mg/dL   BUN 19 6 - 20 mg/dL   Creatinine, Ser 1.09 (H) 0.44 - 1.00 mg/dL   Calcium 14.3 (HH) 8.9 - 10.3 mg/dL   Total Protein 7.3 6.5 - 8.1 g/dL   Albumin 3.0 (L) 3.5 - 5.0 g/dL   AST 28 15 - 41  U/L   ALT 17 0 - 44 U/L   Alkaline Phosphatase 78 38 - 126 U/L   Total Bilirubin 0.8 0.3 - 1.2 mg/dL   GFR, Estimated >60 >60 mL/min   Anion gap 9 5 - 15  CBC  Result Value Ref Range   WBC 11.7 (H) 4.0 - 10.5 K/uL   RBC 5.05 3.87 - 5.11 MIL/uL   Hemoglobin 14.1 12.0 - 15.0 g/dL   HCT 41.9 36.0 - 46.0 %   MCV 83.0 80.0 - 100.0 fL   MCH 27.9 26.0 - 34.0 pg   MCHC 33.7 30.0 - 36.0 g/dL   RDW 15.1 11.5 - 15.5 %   Platelets 209 150 - 400 K/uL   nRBC 0.0 0.0 - 0.2 %  Lactic acid, plasma  Result Value Ref Range   Lactic Acid, Venous 0.8 0.5 - 1.9 mmol/L  Glucose, capillary  Result Value Ref Range   Glucose-Capillary 162 (H) 70 - 99 mg/dL  Multiple Myeloma Panel (SPEP&IFE w/QIG)  Result Value Ref Range   IgG (Immunoglobin G), Serum 1,572 586 - 1,602 mg/dL   IgA 562 (H) 87 - 352 mg/dL   IgM (Immunoglobulin M), Srm 123 26 - 217 mg/dL   Total Protein ELP 6.4 6.0 - 8.5 g/dL   Albumin SerPl Elph-Mcnc 2.6 (L) 2.9 - 4.4 g/dL   Alpha 1 0.3 0.0 - 0.4 g/dL   Alpha2 Glob SerPl Elph-Mcnc 0.9 0.4 - 1.0 g/dL   B-Globulin SerPl Elph-Mcnc 1.2 0.7 - 1.3 g/dL   Gamma Glob SerPl Elph-Mcnc 1.4 0.4 - 1.8 g/dL   M Protein SerPl Elph-Mcnc Not Observed Not Observed g/dL   Globulin, Total 3.8 2.2 - 3.9 g/dL   Albumin/Glob SerPl 0.7 0.7 - 1.7   IFE 1 Comment (A)    Please Note Comment   Glucose, capillary  Result Value Ref Range   Glucose-Capillary 151 (H) 70 -  99 mg/dL  Glucose, capillary  Result Value Ref Range   Glucose-Capillary 199 (H) 70 - 99 mg/dL  CBC with Differential/Platelet  Result Value Ref Range   WBC 12.2 (H) 4.0 - 10.5 K/uL   RBC 4.67 3.87 - 5.11 MIL/uL   Hemoglobin 13.2 12.0 - 15.0 g/dL   HCT 38.9 36.0 - 46.0 %   MCV 83.3 80.0 - 100.0 fL   MCH 28.3 26.0 - 34.0 pg   MCHC 33.9 30.0 - 36.0 g/dL   RDW 15.0 11.5 - 15.5 %   Platelets 209 150 - 400 K/uL   nRBC 0.0 0.0 - 0.2 %   Neutrophils Relative % 82 %   Neutro Abs 10.0 (H) 1.7 - 7.7 K/uL   Lymphocytes Relative 11 %   Lymphs  Abs 1.3 0.7 - 4.0 K/uL   Monocytes Relative 6 %   Monocytes Absolute 0.7 0.1 - 1.0 K/uL   Eosinophils Relative 0 %   Eosinophils Absolute 0.1 0.0 - 0.5 K/uL   Basophils Relative 0 %   Basophils Absolute 0.1 0.0 - 0.1 K/uL   Immature Granulocytes 1 %   Abs Immature Granulocytes 0.07 0.00 - 0.07 K/uL  Comprehensive metabolic panel  Result Value Ref Range   Sodium 140 135 - 145 mmol/L   Potassium 3.2 (L) 3.5 - 5.1 mmol/L   Chloride 113 (H) 98 - 111 mmol/L   CO2 21 (L) 22 - 32 mmol/L   Glucose, Bld 170 (H) 70 - 99 mg/dL   BUN 17 6 - 20 mg/dL   Creatinine, Ser 1.05 (H) 0.44 - 1.00 mg/dL   Calcium 11.4 (H) 8.9 - 10.3 mg/dL   Total Protein 6.8 6.5 - 8.1 g/dL   Albumin 2.8 (L) 3.5 - 5.0 g/dL   AST 26 15 - 41 U/L   ALT 16 0 - 44 U/L   Alkaline Phosphatase 74 38 - 126 U/L   Total Bilirubin 0.9 0.3 - 1.2 mg/dL   GFR, Estimated >60 >60 mL/min   Anion gap 6 5 - 15  Magnesium  Result Value Ref Range   Magnesium 1.5 (L) 1.7 - 2.4 mg/dL  Glucose, capillary  Result Value Ref Range   Glucose-Capillary 172 (H) 70 - 99 mg/dL  Glucose, capillary  Result Value Ref Range   Glucose-Capillary 183 (H) 70 - 99 mg/dL  Glucose, capillary  Result Value Ref Range   Glucose-Capillary 200 (H) 70 - 99 mg/dL  Glucose, capillary  Result Value Ref Range   Glucose-Capillary 164 (H) 70 - 99 mg/dL  CBC with Differential/Platelet  Result Value Ref Range   WBC 12.3 (H) 4.0 - 10.5 K/uL   RBC 4.44 3.87 - 5.11 MIL/uL   Hemoglobin 12.6 12.0 - 15.0 g/dL   HCT 36.5 36.0 - 46.0 %   MCV 82.2 80.0 - 100.0 fL   MCH 28.4 26.0 - 34.0 pg   MCHC 34.5 30.0 - 36.0 g/dL   RDW 14.6 11.5 - 15.5 %   Platelets 191 150 - 400 K/uL   nRBC 0.0 0.0 - 0.2 %   Neutrophils Relative % 77 %   Neutro Abs 9.7 (H) 1.7 - 7.7 K/uL   Lymphocytes Relative 12 %   Lymphs Abs 1.4 0.7 - 4.0 K/uL   Monocytes Relative 7 %   Monocytes Absolute 0.9 0.1 - 1.0 K/uL   Eosinophils Relative 2 %   Eosinophils Absolute 0.2 0.0 - 0.5 K/uL    Basophils Relative 1 %   Basophils Absolute 0.1 0.0 -  0.1 K/uL   Immature Granulocytes 1 %   Abs Immature Granulocytes 0.08 (H) 0.00 - 0.07 K/uL  Comprehensive metabolic panel  Result Value Ref Range   Sodium 138 135 - 145 mmol/L   Potassium 2.9 (L) 3.5 - 5.1 mmol/L   Chloride 113 (H) 98 - 111 mmol/L   CO2 17 (L) 22 - 32 mmol/L   Glucose, Bld 119 (H) 70 - 99 mg/dL   BUN 16 6 - 20 mg/dL   Creatinine, Ser 0.85 0.44 - 1.00 mg/dL   Calcium 10.2 8.9 - 10.3 mg/dL   Total Protein 6.4 (L) 6.5 - 8.1 g/dL   Albumin 2.7 (L) 3.5 - 5.0 g/dL   AST 22 15 - 41 U/L   ALT 15 0 - 44 U/L   Alkaline Phosphatase 71 38 - 126 U/L   Total Bilirubin 1.0 0.3 - 1.2 mg/dL   GFR, Estimated >60 >60 mL/min   Anion gap 8 5 - 15  Magnesium  Result Value Ref Range   Magnesium 2.1 1.7 - 2.4 mg/dL  Cancer antigen 15-3  Result Value Ref Range   CA 15-3 104.0 (H) 0.0 - 25.0 U/mL  Cancer antigen 27.29  Result Value Ref Range   CA 27.29 111.2 (H) 0.0 - 38.6 U/mL  Glucose, capillary  Result Value Ref Range   Glucose-Capillary 104 (H) 70 - 99 mg/dL  Glucose, capillary  Result Value Ref Range   Glucose-Capillary 115 (H) 70 - 99 mg/dL  Glucose, capillary  Result Value Ref Range   Glucose-Capillary 147 (H) 70 - 99 mg/dL  Glucose, capillary  Result Value Ref Range   Glucose-Capillary 138 (H) 70 - 99 mg/dL  CBC with Differential/Platelet  Result Value Ref Range   WBC 14.2 (H) 4.0 - 10.5 K/uL   RBC 4.61 3.87 - 5.11 MIL/uL   Hemoglobin 12.9 12.0 - 15.0 g/dL   HCT 37.6 36.0 - 46.0 %   MCV 81.6 80.0 - 100.0 fL   MCH 28.0 26.0 - 34.0 pg   MCHC 34.3 30.0 - 36.0 g/dL   RDW 14.6 11.5 - 15.5 %   Platelets 195 150 - 400 K/uL   nRBC 0.0 0.0 - 0.2 %   Neutrophils Relative % 83 %   Neutro Abs 12.0 (H) 1.7 - 7.7 K/uL   Lymphocytes Relative 8 %   Lymphs Abs 1.1 0.7 - 4.0 K/uL   Monocytes Relative 6 %   Monocytes Absolute 0.8 0.1 - 1.0 K/uL   Eosinophils Relative 1 %   Eosinophils Absolute 0.2 0.0 - 0.5 K/uL    Basophils Relative 1 %   Basophils Absolute 0.1 0.0 - 0.1 K/uL   Immature Granulocytes 1 %   Abs Immature Granulocytes 0.08 (H) 0.00 - 0.07 K/uL  Comprehensive metabolic panel  Result Value Ref Range   Sodium 137 135 - 145 mmol/L   Potassium 3.2 (L) 3.5 - 5.1 mmol/L   Chloride 112 (H) 98 - 111 mmol/L   CO2 16 (L) 22 - 32 mmol/L   Glucose, Bld 135 (H) 70 - 99 mg/dL   BUN 22 (H) 6 - 20 mg/dL   Creatinine, Ser 0.82 0.44 - 1.00 mg/dL   Calcium 9.8 8.9 - 10.3 mg/dL   Total Protein 6.3 (L) 6.5 - 8.1 g/dL   Albumin 2.5 (L) 3.5 - 5.0 g/dL   AST 21 15 - 41 U/L   ALT 15 0 - 44 U/L   Alkaline Phosphatase 74 38 - 126 U/L   Total Bilirubin  0.8 0.3 - 1.2 mg/dL   GFR, Estimated >60 >60 mL/min   Anion gap 9 5 - 15  Magnesium  Result Value Ref Range   Magnesium 1.7 1.7 - 2.4 mg/dL  Glucose, capillary  Result Value Ref Range   Glucose-Capillary 115 (H) 70 - 99 mg/dL  Glucose, capillary  Result Value Ref Range   Glucose-Capillary 162 (H) 70 - 99 mg/dL  Glucose, capillary  Result Value Ref Range   Glucose-Capillary 89 70 - 99 mg/dL  CBC with Differential/Platelet  Result Value Ref Range   WBC 11.5 (H) 4.0 - 10.5 K/uL   RBC 4.42 3.87 - 5.11 MIL/uL   Hemoglobin 12.6 12.0 - 15.0 g/dL   HCT 36.0 36.0 - 46.0 %   MCV 81.4 80.0 - 100.0 fL   MCH 28.5 26.0 - 34.0 pg   MCHC 35.0 30.0 - 36.0 g/dL   RDW 14.6 11.5 - 15.5 %   Platelets 198 150 - 400 K/uL   nRBC 0.0 0.0 - 0.2 %   Neutrophils Relative % 75 %   Neutro Abs 8.6 (H) 1.7 - 7.7 K/uL   Lymphocytes Relative 14 %   Lymphs Abs 1.6 0.7 - 4.0 K/uL   Monocytes Relative 7 %   Monocytes Absolute 0.9 0.1 - 1.0 K/uL   Eosinophils Relative 3 %   Eosinophils Absolute 0.4 0.0 - 0.5 K/uL   Basophils Relative 1 %   Basophils Absolute 0.1 0.0 - 0.1 K/uL   Immature Granulocytes 0 %   Abs Immature Granulocytes 0.05 0.00 - 0.07 K/uL  Comprehensive metabolic panel  Result Value Ref Range   Sodium 138 135 - 145 mmol/L   Potassium 3.2 (L) 3.5 - 5.1  mmol/L   Chloride 112 (H) 98 - 111 mmol/L   CO2 18 (L) 22 - 32 mmol/L   Glucose, Bld 91 70 - 99 mg/dL   BUN 21 (H) 6 - 20 mg/dL   Creatinine, Ser 0.64 0.44 - 1.00 mg/dL   Calcium 9.3 8.9 - 10.3 mg/dL   Total Protein 6.0 (L) 6.5 - 8.1 g/dL   Albumin 2.5 (L) 3.5 - 5.0 g/dL   AST 21 15 - 41 U/L   ALT 14 0 - 44 U/L   Alkaline Phosphatase 74 38 - 126 U/L   Total Bilirubin 0.8 0.3 - 1.2 mg/dL   GFR, Estimated >60 >60 mL/min   Anion gap 8 5 - 15  Magnesium  Result Value Ref Range   Magnesium 2.4 1.7 - 2.4 mg/dL  Glucose, capillary  Result Value Ref Range   Glucose-Capillary 102 (H) 70 - 99 mg/dL  Glucose, capillary  Result Value Ref Range   Glucose-Capillary 92 70 - 99 mg/dL  Glucose, capillary  Result Value Ref Range   Glucose-Capillary 75 70 - 99 mg/dL  Glucose, capillary  Result Value Ref Range   Glucose-Capillary 106 (H) 70 - 99 mg/dL  CBC with Differential/Platelet  Result Value Ref Range   WBC 11.2 (H) 4.0 - 10.5 K/uL   RBC 4.21 3.87 - 5.11 MIL/uL   Hemoglobin 11.8 (L) 12.0 - 15.0 g/dL   HCT 34.2 (L) 36.0 - 46.0 %   MCV 81.2 80.0 - 100.0 fL   MCH 28.0 26.0 - 34.0 pg   MCHC 34.5 30.0 - 36.0 g/dL   RDW 14.6 11.5 - 15.5 %   Platelets 213 150 - 400 K/uL   nRBC 0.0 0.0 - 0.2 %   Neutrophils Relative % 70 %   Neutro Abs  7.9 (H) 1.7 - 7.7 K/uL   Lymphocytes Relative 17 %   Lymphs Abs 1.9 0.7 - 4.0 K/uL   Monocytes Relative 9 %   Monocytes Absolute 1.0 0.1 - 1.0 K/uL   Eosinophils Relative 2 %   Eosinophils Absolute 0.3 0.0 - 0.5 K/uL   Basophils Relative 1 %   Basophils Absolute 0.1 0.0 - 0.1 K/uL   Immature Granulocytes 1 %   Abs Immature Granulocytes 0.06 0.00 - 0.07 K/uL  Comprehensive metabolic panel  Result Value Ref Range   Sodium 135 135 - 145 mmol/L   Potassium 3.1 (L) 3.5 - 5.1 mmol/L   Chloride 110 98 - 111 mmol/L   CO2 19 (L) 22 - 32 mmol/L   Glucose, Bld 112 (H) 70 - 99 mg/dL   BUN 21 (H) 6 - 20 mg/dL   Creatinine, Ser 0.69 0.44 - 1.00 mg/dL    Calcium 8.9 8.9 - 10.3 mg/dL   Total Protein 6.2 (L) 6.5 - 8.1 g/dL   Albumin 2.4 (L) 3.5 - 5.0 g/dL   AST 20 15 - 41 U/L   ALT 12 0 - 44 U/L   Alkaline Phosphatase 75 38 - 126 U/L   Total Bilirubin 0.6 0.3 - 1.2 mg/dL   GFR, Estimated >60 >60 mL/min   Anion gap 6 5 - 15  Magnesium  Result Value Ref Range   Magnesium 2.2 1.7 - 2.4 mg/dL  Glucose, capillary  Result Value Ref Range   Glucose-Capillary 149 (H) 70 - 99 mg/dL  Glucose, capillary  Result Value Ref Range   Glucose-Capillary 122 (H) 70 - 99 mg/dL  Glucose, capillary  Result Value Ref Range   Glucose-Capillary 152 (H) 70 - 99 mg/dL  Glucose, capillary  Result Value Ref Range   Glucose-Capillary 152 (H) 70 - 99 mg/dL  CBG monitoring, ED  Result Value Ref Range   Glucose-Capillary 233 (H) 70 - 99 mg/dL  CBG monitoring, ED  Result Value Ref Range   Glucose-Capillary 171 (H) 70 - 99 mg/dL  Cytology - Non PAP;  Result Value Ref Range   CYTOLOGY - NON GYN      CYTOLOGY - NON PAP CASE: [id number] PATIENT: [name] Non-Gynecological Cytology Report     Clinical History: Mediastinal adenopathy    FINAL MICROSCOPIC DIAGNOSIS: A. LYMPH NODE, 10R, FINE NEEDLE ASPIRATION: - Numerous lymphocytes - No metastatic carcinoma identified  B. LYMPH NODE, SUBCARINA, FINE NEEDLE ASPIRATION: - Numerous lymphocytes - No metastatic carcinoma identified  C. LYMPH NODE, 4R, FINE NEEDLE ASPIRATION: - Numerous lymphocytes - No metastatic carcinoma identified  SPECIMEN ADEQUACY: A. Satisfactory for Evaluation B. Satisfactory for Evaluation C. Satisfactory for Evaluation  GROSS: Received is/are Prepared:  (A) 1) 2 slides (1 for quick stain), 2) 2 slides (1 for quick stain), 3) 2 slides (1 for quick stain), and 10 ccs of pale pink Saline solution from needle rinses, (B) 1) 2 slides (1 for quick stain), 2) 2 slides (1 for quick stain), 3) 2 slides (1 for quick stain), and 10 ccs of pink Saline solution from needle  rinses, (C)  1) 2 slides (1 for quick stain), 2) 2 slides (1 for quick stain), 3) 2 slides (1 for quick stain), and 10 ccs of pale pink Saline solution from needle rinses. (TS:CM:cm) Smears: (A) 6 (B) 6 (C) 6 Concentration Method (Thin Prep): (A) 1 (B) 1 (C) 1 Cell Block: (A) 1 (B) 1 (C) 1 Additional Studies: n/a  Final Diagnosis performed by Claudette Laws,  MD.   Electronically signed 06/24/2022 Technical component performed at [facility], [place] [address], [place], [place] [address].  Professional component performed at [employer]. [facility], [place] [address] [place], [place] [address].  Immunohistochemistry Technical component (if applicable) was performed at [facility]. [address] [place], [place], [place] [address].   IMMUNOHISTOCHEMISTRY DISCLAIMER (if applicable): Some of these immunohistochemical stains may have been developed and the performance characteristics determine by [facility]. Some may not have bee n cleared or approved by the U.S. Food and Drug Administration. The FDA has determined that such clearance or approval is not necessary. This test is used for clinical purposes. It should not be regarded as investigational or for research. This laboratory is certified under the [place] (CLIA-88) as qualified to perform high complexity clinical laboratory testing.  The controls stained appropriately.    *Note: Due to a large number of results and/or encounters for the requested time period, some results have not been displayed. A complete set of results can be found in Results Review.   Assessment & Plan:   Problem List Items Addressed This Visit       Endocrine   Uncontrolled type 2 diabetes mellitus with hyperglycemia, with long-term current use of insulin ([place])    Appears slightly improved; however, this is likely due to recent hospitalization with routine  glucose checks and insulin management.  Increase Lantus to 30 units daily. Stop empagliflozin-metformin XR 25-1000 given genital yeast infections. Start metformin XR 1000 mg daily.  Remain off of NovoLog for now.  Follow-up in 1 month      Relevant Medications   insulin glargine (LANTUS) 100 UNIT/ML Solostar Pen   metFORMIN (GLUCOPHAGE-XR) 500 MG 24 hr tablet     Musculoskeletal and Integument   Lytic bone lesion of right femur    Incidental finding on CT abdomen pelvis from recent hospital stay. Follow-up with oncology as scheduled.        Genitourinary   Vaginal itching    Discontinuing empagliflozin-metformin.  Prescription for fluconazole 150 mg tablet sent to pharmacy.        Other   Constipation - Primary    Reducing dose of Linzess to 72 mcg as needed to avoid significant diarrhea. Discussed this with patient and her husband today.      Relevant Medications   linaclotide (LINZESS) 72 MCG capsule   History of breast cancer    With metastases to bone and potentially lungs. Following with oncology.  Await updated office notes.        Hypercalcemia    Recent hospitalization. Hospital notes, labs, imaging reviewed.  Likely from metastatic malignancy.  Unable to recheck calcium and other electrolytes given our virtual platform.  She will be seeing her oncologist next week and will have labs drawn then.        Other Visit Diagnoses     Vaginal yeast infection       Relevant Medications   fluconazole (DIFLUCAN) 150 MG tablet        Meds ordered this encounter  Medications   insulin glargine (LANTUS) 100 UNIT/ML Solostar Pen    Sig: Inject 30 Units into the skin daily.    Dispense:  15 mL    Refill:  11    Order Specific Question:   Supervising Provider    Answer:   Diona Browner, AMY E [[id number]]   linaclotide (LINZESS) 72 MCG capsule    Sig:  Take 1 capsule (72 mcg total) by mouth daily as needed. For constipation    Dispense:  90 capsule    Refill:   0    Order Specific Question:   Supervising Provider    Answer:   BEDSOLE, AMY E [[id number]]   metFORMIN (GLUCOPHAGE-XR) 500 MG 24 hr tablet    Sig: Take 2 tablets (1,000 mg total) by mouth daily with breakfast. for diabetes.    Dispense:  180 tablet    Refill:  1    Order Specific Question:   Supervising Provider    Answer:   BEDSOLE, AMY E [[id number]]   fluconazole (DIFLUCAN) 150 MG tablet    Sig: Take 1 tablet (150 mg total) by mouth once for 1 dose. For yeast infection    Dispense:  1 tablet    Refill:  0    Order Specific Question:   Supervising Provider    Answer:   BEDSOLE, AMY E [[id number]]   No orders of the defined types were placed in this encounter.   I discussed the assessment and treatment plan with the patient. The patient was provided an opportunity to ask questions and all were answered. The patient agreed with the plan and demonstrated an understanding of the instructions. The patient was advised to call back or seek an in-person evaluation if the symptoms worsen or if the condition fails to improve as anticipated.  Follow up plan:  Stop taking empagliflozin-metformin given vaginal itching and infection.  Start metformin XR 500 mg tablets.  Take 2 tablets by mouth every morning.  Increase your Lantus insulin to 30 units daily.  Schedule a follow-up visit for 1 month.  Follow-up with oncology as scheduled.  Pleas Koch, NP

## 2022-06-30 NOTE — Assessment & Plan Note (Signed)
Recent hospitalization. Hospital notes, labs, imaging reviewed.  Likely from metastatic malignancy.  Unable to recheck calcium and other electrolytes given our virtual platform.  She will be seeing her oncologist next week and will have labs drawn then.

## 2022-06-30 NOTE — Assessment & Plan Note (Signed)
With metastases to bone and potentially lungs. Following with oncology.  Await updated office notes.

## 2022-06-30 NOTE — Assessment & Plan Note (Signed)
Discontinuing empagliflozin-metformin.  Prescription for fluconazole 150 mg tablet sent to pharmacy.

## 2022-06-30 NOTE — Patient Instructions (Signed)
Stop taking empagliflozin-metformin given vaginal itching and infection.  Start metformin XR 500 mg tablets.  Take 2 tablets by mouth every morning.  Increase your Lantus insulin to 30 units daily.  Schedule a follow-up visit for 1 month.  Follow-up with oncology as scheduled.

## 2022-06-30 NOTE — Assessment & Plan Note (Signed)
Incidental finding on CT abdomen pelvis from recent hospital stay. Follow-up with oncology as scheduled.

## 2022-06-30 NOTE — Assessment & Plan Note (Signed)
Appears slightly improved; however, this is likely due to recent hospitalization with routine glucose checks and insulin management.  Increase Lantus to 30 units daily. Stop empagliflozin-metformin XR 25-1000 given genital yeast infections. Start metformin XR 1000 mg daily.  Remain off of NovoLog for now.  Follow-up in 1 month

## 2022-07-05 ENCOUNTER — Telehealth: Payer: Self-pay | Admitting: Primary Care

## 2022-07-05 ENCOUNTER — Telehealth: Payer: Self-pay | Admitting: *Deleted

## 2022-07-05 DIAGNOSIS — J9611 Chronic respiratory failure with hypoxia: Secondary | ICD-10-CM

## 2022-07-05 DIAGNOSIS — E1165 Type 2 diabetes mellitus with hyperglycemia: Secondary | ICD-10-CM

## 2022-07-05 DIAGNOSIS — Z853 Personal history of malignant neoplasm of breast: Secondary | ICD-10-CM

## 2022-07-05 DIAGNOSIS — Z89511 Acquired absence of right leg below knee: Secondary | ICD-10-CM

## 2022-07-05 DIAGNOSIS — M899 Disorder of bone, unspecified: Secondary | ICD-10-CM

## 2022-07-05 NOTE — Telephone Encounter (Signed)
Called and notified patients husband of the referral placed for hospice care.

## 2022-07-05 NOTE — Telephone Encounter (Signed)
Mr Person called reporting that patient has taken a turn for the worse and he knows her status changed recently, he is asking what he can do for her for comfort measures at home. Please advise

## 2022-07-05 NOTE — Telephone Encounter (Signed)
Recommend she go with the Hospice provider, especially given her significant medical history.

## 2022-07-05 NOTE — Telephone Encounter (Signed)
Tonya from [facility] would like to know if Anda Kraft is going to be the attending provider for this patients hospice care?

## 2022-07-05 NOTE — Telephone Encounter (Signed)
Patient husbands called in to let Anda Kraft know that his wife [name] is taking a turn for the worst with her cancer,and he would like to know if he can receive help with having hospice called in for her? [phone]

## 2022-07-05 NOTE — Telephone Encounter (Signed)
Looks like hospice referral has been placed by PCP.

## 2022-07-05 NOTE — Telephone Encounter (Signed)
Noted.  Will refer to hospice. Looks like she is already on palliative care through [facility].

## 2022-07-06 ENCOUNTER — Encounter (HOSPITAL_COMMUNITY): Admission: RE | Admit: 2022-07-06 | Payer: BC Managed Care – PPO | Source: Ambulatory Visit

## 2022-07-06 ENCOUNTER — Ambulatory Visit: Payer: BC Managed Care – PPO | Admitting: Pain Medicine

## 2022-07-06 DIAGNOSIS — I110 Hypertensive heart disease with heart failure: Secondary | ICD-10-CM | POA: Diagnosis not present

## 2022-07-06 DIAGNOSIS — L89322 Pressure ulcer of left buttock, stage 2: Secondary | ICD-10-CM | POA: Diagnosis not present

## 2022-07-06 DIAGNOSIS — E785 Hyperlipidemia, unspecified: Secondary | ICD-10-CM | POA: Diagnosis not present

## 2022-07-06 DIAGNOSIS — G40909 Epilepsy, unspecified, not intractable, without status epilepticus: Secondary | ICD-10-CM | POA: Diagnosis not present

## 2022-07-06 DIAGNOSIS — I739 Peripheral vascular disease, unspecified: Secondary | ICD-10-CM | POA: Diagnosis not present

## 2022-07-06 DIAGNOSIS — C7800 Secondary malignant neoplasm of unspecified lung: Secondary | ICD-10-CM | POA: Diagnosis not present

## 2022-07-06 DIAGNOSIS — L89312 Pressure ulcer of right buttock, stage 2: Secondary | ICD-10-CM | POA: Diagnosis not present

## 2022-07-06 DIAGNOSIS — I25119 Atherosclerotic heart disease of native coronary artery with unspecified angina pectoris: Secondary | ICD-10-CM | POA: Diagnosis not present

## 2022-07-06 DIAGNOSIS — C50511 Malignant neoplasm of lower-outer quadrant of right female breast: Secondary | ICD-10-CM | POA: Diagnosis not present

## 2022-07-06 DIAGNOSIS — I69818 Other symptoms and signs involving cognitive functions following other cerebrovascular disease: Secondary | ICD-10-CM | POA: Diagnosis not present

## 2022-07-06 DIAGNOSIS — F319 Bipolar disorder, unspecified: Secondary | ICD-10-CM | POA: Diagnosis not present

## 2022-07-06 DIAGNOSIS — Z89512 Acquired absence of left leg below knee: Secondary | ICD-10-CM | POA: Diagnosis not present

## 2022-07-06 DIAGNOSIS — Z170 Estrogen receptor positive status [ER+]: Secondary | ICD-10-CM | POA: Diagnosis not present

## 2022-07-06 DIAGNOSIS — C7951 Secondary malignant neoplasm of bone: Secondary | ICD-10-CM | POA: Diagnosis not present

## 2022-07-06 DIAGNOSIS — I509 Heart failure, unspecified: Secondary | ICD-10-CM | POA: Diagnosis not present

## 2022-07-06 NOTE — Telephone Encounter (Signed)
Called and spoke with Kenney Houseman from [facility] and advised Anda Kraft is deferring to the Hospice provider as the attending to care for patient due to her medical history.

## 2022-07-07 ENCOUNTER — Encounter: Payer: Self-pay | Admitting: Internal Medicine

## 2022-07-07 DIAGNOSIS — Z89512 Acquired absence of left leg below knee: Secondary | ICD-10-CM | POA: Diagnosis not present

## 2022-07-07 DIAGNOSIS — C7800 Secondary malignant neoplasm of unspecified lung: Secondary | ICD-10-CM | POA: Diagnosis not present

## 2022-07-07 DIAGNOSIS — E785 Hyperlipidemia, unspecified: Secondary | ICD-10-CM | POA: Diagnosis not present

## 2022-07-07 DIAGNOSIS — I69818 Other symptoms and signs involving cognitive functions following other cerebrovascular disease: Secondary | ICD-10-CM | POA: Diagnosis not present

## 2022-07-07 DIAGNOSIS — I110 Hypertensive heart disease with heart failure: Secondary | ICD-10-CM | POA: Diagnosis not present

## 2022-07-07 DIAGNOSIS — F319 Bipolar disorder, unspecified: Secondary | ICD-10-CM | POA: Diagnosis not present

## 2022-07-07 DIAGNOSIS — Z170 Estrogen receptor positive status [ER+]: Secondary | ICD-10-CM | POA: Diagnosis not present

## 2022-07-07 DIAGNOSIS — I25119 Atherosclerotic heart disease of native coronary artery with unspecified angina pectoris: Secondary | ICD-10-CM | POA: Diagnosis not present

## 2022-07-07 DIAGNOSIS — G40909 Epilepsy, unspecified, not intractable, without status epilepticus: Secondary | ICD-10-CM | POA: Diagnosis not present

## 2022-07-07 DIAGNOSIS — C7951 Secondary malignant neoplasm of bone: Secondary | ICD-10-CM | POA: Diagnosis not present

## 2022-07-07 DIAGNOSIS — L89322 Pressure ulcer of left buttock, stage 2: Secondary | ICD-10-CM | POA: Diagnosis not present

## 2022-07-07 DIAGNOSIS — I739 Peripheral vascular disease, unspecified: Secondary | ICD-10-CM | POA: Diagnosis not present

## 2022-07-07 DIAGNOSIS — I509 Heart failure, unspecified: Secondary | ICD-10-CM | POA: Diagnosis not present

## 2022-07-07 DIAGNOSIS — L89312 Pressure ulcer of right buttock, stage 2: Secondary | ICD-10-CM | POA: Diagnosis not present

## 2022-07-07 DIAGNOSIS — C50511 Malignant neoplasm of lower-outer quadrant of right female breast: Secondary | ICD-10-CM | POA: Diagnosis not present

## 2022-07-07 IMAGING — DX DG FOOT COMPLETE 3+V*L*
3 series · 3 of 3 positions shown · non-contrast
Comparison: Left foot radiographs 09/20/2021.

CLINICAL DATA: Swelling and pain.  Ulcerations.

EXAM:
LEFT FOOT - COMPLETE 3+ VIEW

[foot ap]
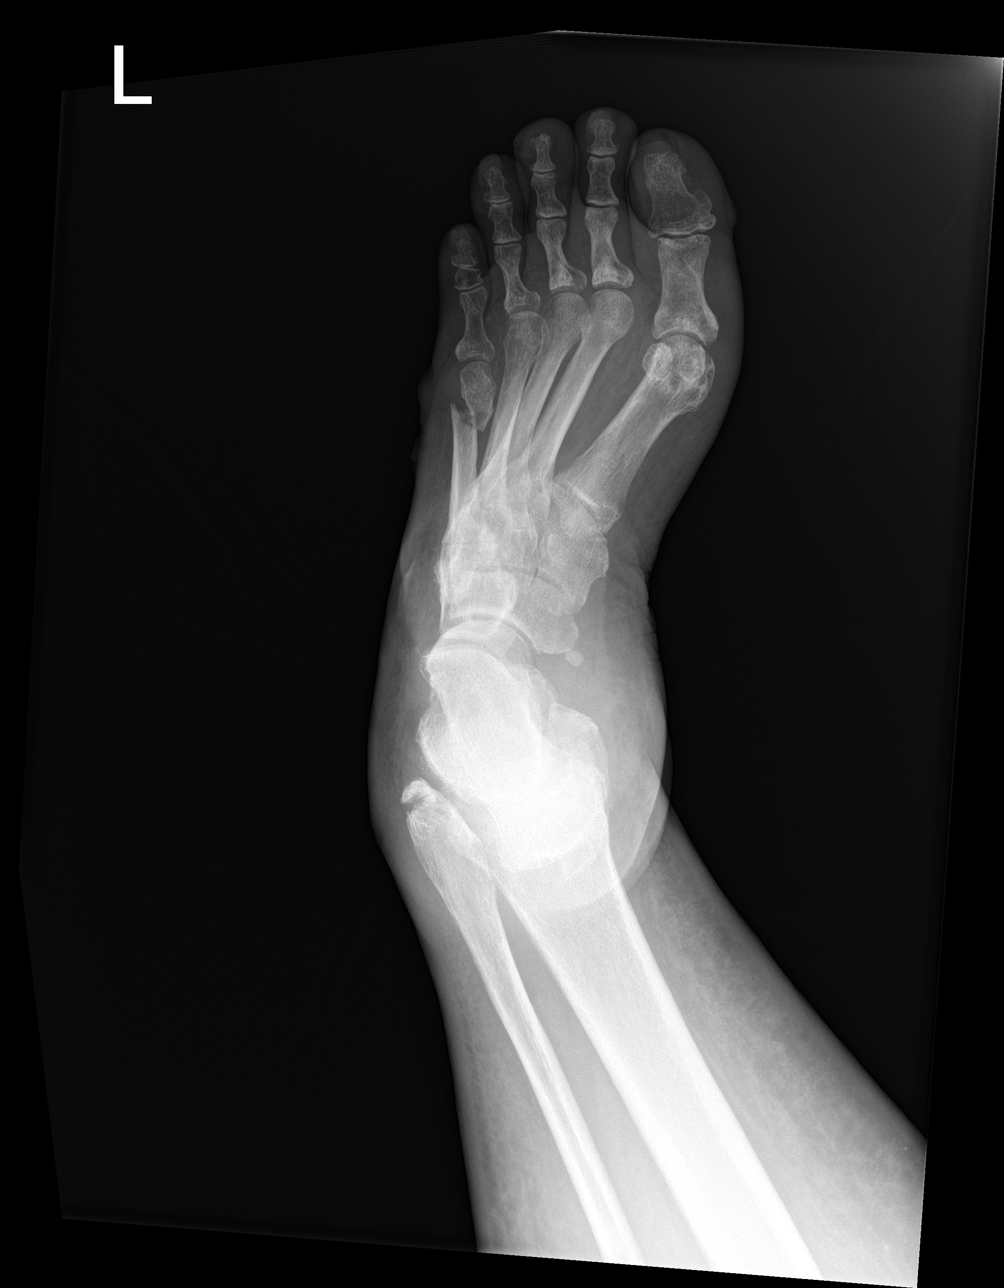

[foot obl]
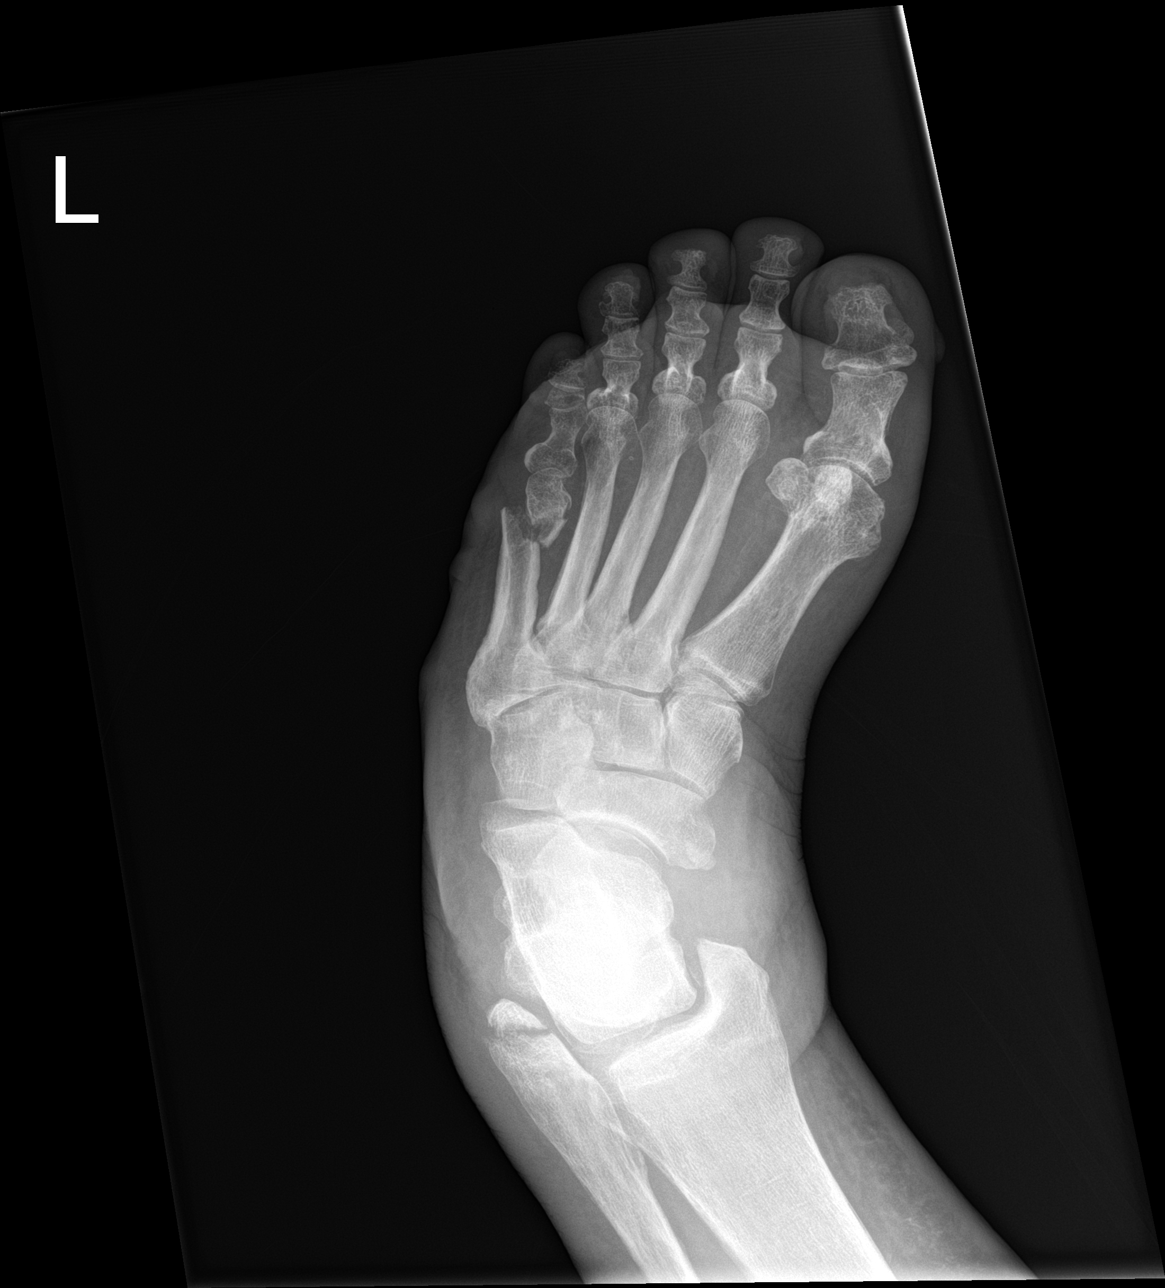

[foot lat]
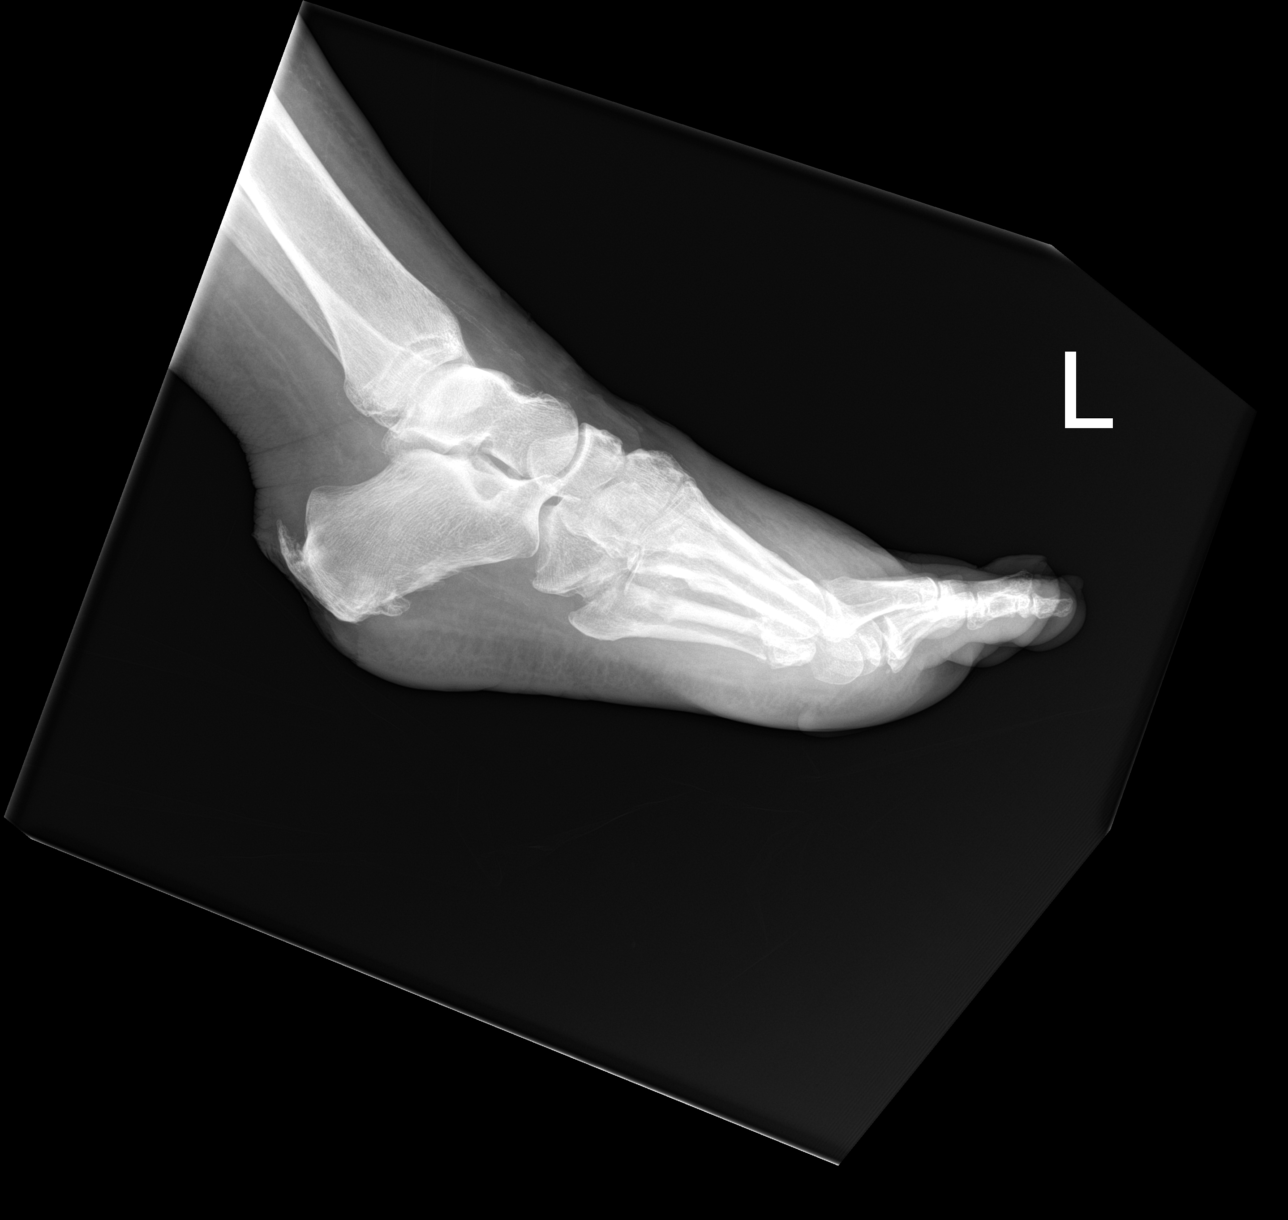

[3 of 3 positions shown; findings below may reference images not displayed]

FINDINGS: Acute comminuted fracture present in the distal left fifth
metatarsal. A new nondisplaced fracture is present at the lateral
malleolus. Diffuse soft tissue swelling is present over the anterior
foot. Soft swelling is present the ankle as well.
IMPRESSION: 1. Acute comminuted fracture of the distal left fifth metatarsal.
2. New nondisplaced fracture at the lateral malleolus.
3. Diffuse soft tissue swelling.

## 2022-07-08 ENCOUNTER — Inpatient Hospital Stay: Payer: BC Managed Care – PPO | Admitting: Internal Medicine

## 2022-07-08 ENCOUNTER — Telehealth: Payer: Self-pay | Admitting: Primary Care

## 2022-07-08 DIAGNOSIS — I25119 Atherosclerotic heart disease of native coronary artery with unspecified angina pectoris: Secondary | ICD-10-CM | POA: Diagnosis not present

## 2022-07-08 DIAGNOSIS — L89322 Pressure ulcer of left buttock, stage 2: Secondary | ICD-10-CM | POA: Diagnosis not present

## 2022-07-08 DIAGNOSIS — L89312 Pressure ulcer of right buttock, stage 2: Secondary | ICD-10-CM | POA: Diagnosis not present

## 2022-07-08 DIAGNOSIS — C7951 Secondary malignant neoplasm of bone: Secondary | ICD-10-CM | POA: Diagnosis not present

## 2022-07-08 DIAGNOSIS — Z89512 Acquired absence of left leg below knee: Secondary | ICD-10-CM | POA: Diagnosis not present

## 2022-07-08 DIAGNOSIS — C7800 Secondary malignant neoplasm of unspecified lung: Secondary | ICD-10-CM | POA: Diagnosis not present

## 2022-07-08 DIAGNOSIS — I509 Heart failure, unspecified: Secondary | ICD-10-CM | POA: Diagnosis not present

## 2022-07-08 DIAGNOSIS — G40909 Epilepsy, unspecified, not intractable, without status epilepticus: Secondary | ICD-10-CM | POA: Diagnosis not present

## 2022-07-08 DIAGNOSIS — I69818 Other symptoms and signs involving cognitive functions following other cerebrovascular disease: Secondary | ICD-10-CM | POA: Diagnosis not present

## 2022-07-08 DIAGNOSIS — I110 Hypertensive heart disease with heart failure: Secondary | ICD-10-CM | POA: Diagnosis not present

## 2022-07-08 DIAGNOSIS — C50511 Malignant neoplasm of lower-outer quadrant of right female breast: Secondary | ICD-10-CM | POA: Diagnosis not present

## 2022-07-08 DIAGNOSIS — I739 Peripheral vascular disease, unspecified: Secondary | ICD-10-CM | POA: Diagnosis not present

## 2022-07-08 DIAGNOSIS — E785 Hyperlipidemia, unspecified: Secondary | ICD-10-CM | POA: Diagnosis not present

## 2022-07-08 DIAGNOSIS — Z170 Estrogen receptor positive status [ER+]: Secondary | ICD-10-CM | POA: Diagnosis not present

## 2022-07-08 DIAGNOSIS — F319 Bipolar disorder, unspecified: Secondary | ICD-10-CM | POA: Diagnosis not present

## 2022-07-08 NOTE — Telephone Encounter (Signed)
Returned call from Dr. [name] he was requesting clarification regarding the order that was placed for hospice care. Advised the patients husband, [name] requested hospice care be ordered due to the patients health declining from her cancer. The attending provider will be the hospice care provider due to the patients significant medical history. Dr. [name] verbalized agreement with orders for hospice and wanted to confirm that the family is informed and notified. He would like to be kept updated with the patients condition if there are any significant changes.

## 2022-07-08 NOTE — Telephone Encounter (Signed)
Noted. Please notify hospice of this information.

## 2022-07-08 NOTE — Telephone Encounter (Signed)
Called [facility] and notified the triage nurse that patients oncologist, Dr .[name] would like to receive updates regarding the patient status. Advised he is within the [facility]. Triage nurse stated she would send the message to the patients nurse.

## 2022-07-08 NOTE — Telephone Encounter (Signed)
Sharon called requesting a call back stated Dr. Larena Sox would like to discuss pt care # [phone]

## 2022-07-09 DIAGNOSIS — I509 Heart failure, unspecified: Secondary | ICD-10-CM | POA: Diagnosis not present

## 2022-07-09 DIAGNOSIS — E785 Hyperlipidemia, unspecified: Secondary | ICD-10-CM | POA: Diagnosis not present

## 2022-07-09 DIAGNOSIS — L89322 Pressure ulcer of left buttock, stage 2: Secondary | ICD-10-CM | POA: Diagnosis not present

## 2022-07-09 DIAGNOSIS — Z170 Estrogen receptor positive status [ER+]: Secondary | ICD-10-CM | POA: Diagnosis not present

## 2022-07-09 DIAGNOSIS — I69818 Other symptoms and signs involving cognitive functions following other cerebrovascular disease: Secondary | ICD-10-CM | POA: Diagnosis not present

## 2022-07-09 DIAGNOSIS — C50511 Malignant neoplasm of lower-outer quadrant of right female breast: Secondary | ICD-10-CM | POA: Diagnosis not present

## 2022-07-09 DIAGNOSIS — C7951 Secondary malignant neoplasm of bone: Secondary | ICD-10-CM | POA: Diagnosis not present

## 2022-07-09 DIAGNOSIS — C7800 Secondary malignant neoplasm of unspecified lung: Secondary | ICD-10-CM | POA: Diagnosis not present

## 2022-07-09 DIAGNOSIS — I25119 Atherosclerotic heart disease of native coronary artery with unspecified angina pectoris: Secondary | ICD-10-CM | POA: Diagnosis not present

## 2022-07-09 DIAGNOSIS — Z89512 Acquired absence of left leg below knee: Secondary | ICD-10-CM | POA: Diagnosis not present

## 2022-07-09 DIAGNOSIS — L89312 Pressure ulcer of right buttock, stage 2: Secondary | ICD-10-CM | POA: Diagnosis not present

## 2022-07-09 DIAGNOSIS — I110 Hypertensive heart disease with heart failure: Secondary | ICD-10-CM | POA: Diagnosis not present

## 2022-07-09 DIAGNOSIS — I739 Peripheral vascular disease, unspecified: Secondary | ICD-10-CM | POA: Diagnosis not present

## 2022-07-09 DIAGNOSIS — F319 Bipolar disorder, unspecified: Secondary | ICD-10-CM | POA: Diagnosis not present

## 2022-07-09 DIAGNOSIS — G40909 Epilepsy, unspecified, not intractable, without status epilepticus: Secondary | ICD-10-CM | POA: Diagnosis not present

## 2022-07-10 DIAGNOSIS — F319 Bipolar disorder, unspecified: Secondary | ICD-10-CM | POA: Diagnosis not present

## 2022-07-10 DIAGNOSIS — I739 Peripheral vascular disease, unspecified: Secondary | ICD-10-CM | POA: Diagnosis not present

## 2022-07-10 DIAGNOSIS — I25119 Atherosclerotic heart disease of native coronary artery with unspecified angina pectoris: Secondary | ICD-10-CM | POA: Diagnosis not present

## 2022-07-10 DIAGNOSIS — C7951 Secondary malignant neoplasm of bone: Secondary | ICD-10-CM | POA: Diagnosis not present

## 2022-07-10 DIAGNOSIS — C50511 Malignant neoplasm of lower-outer quadrant of right female breast: Secondary | ICD-10-CM | POA: Diagnosis not present

## 2022-07-10 DIAGNOSIS — C7800 Secondary malignant neoplasm of unspecified lung: Secondary | ICD-10-CM | POA: Diagnosis not present

## 2022-07-10 DIAGNOSIS — E785 Hyperlipidemia, unspecified: Secondary | ICD-10-CM | POA: Diagnosis not present

## 2022-07-10 DIAGNOSIS — Z89512 Acquired absence of left leg below knee: Secondary | ICD-10-CM | POA: Diagnosis not present

## 2022-07-10 DIAGNOSIS — I110 Hypertensive heart disease with heart failure: Secondary | ICD-10-CM | POA: Diagnosis not present

## 2022-07-10 DIAGNOSIS — L89322 Pressure ulcer of left buttock, stage 2: Secondary | ICD-10-CM | POA: Diagnosis not present

## 2022-07-10 DIAGNOSIS — G40909 Epilepsy, unspecified, not intractable, without status epilepticus: Secondary | ICD-10-CM | POA: Diagnosis not present

## 2022-07-10 DIAGNOSIS — I509 Heart failure, unspecified: Secondary | ICD-10-CM | POA: Diagnosis not present

## 2022-07-10 DIAGNOSIS — L89312 Pressure ulcer of right buttock, stage 2: Secondary | ICD-10-CM | POA: Diagnosis not present

## 2022-07-10 DIAGNOSIS — I69818 Other symptoms and signs involving cognitive functions following other cerebrovascular disease: Secondary | ICD-10-CM | POA: Diagnosis not present

## 2022-07-10 DIAGNOSIS — Z170 Estrogen receptor positive status [ER+]: Secondary | ICD-10-CM | POA: Diagnosis not present

## 2022-07-11 DEATH — deceased

## 2022-07-13 ENCOUNTER — Telehealth: Payer: Self-pay | Admitting: Primary Care

## 2022-07-13 ENCOUNTER — Telehealth: Payer: Self-pay | Admitting: *Deleted

## 2022-07-13 NOTE — Telephone Encounter (Signed)
Pt husband Kennith Center called in returning call . Requesting a call back [phone]

## 2022-07-13 NOTE — Telephone Encounter (Signed)
Mr Molla called to report that patient expired 07-23-22 and wanted to thank [place] for everything we did for herd

## 2022-07-13 NOTE — Telephone Encounter (Signed)
Patients husband called in to let [name] know that the patient passed away 09/29/2022 14-Jul-2022 at 8am. He is appreciative to [place] for all she did for them, and wanted to let her know he will still be coming to see her as a patient.
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
## 2022-07-18 ENCOUNTER — Telehealth: Payer: Self-pay | Admitting: Primary Care

## 2022-07-18 NOTE — Telephone Encounter (Signed)
Sharyn Lull from [facility] called requesting to speak to [facility] regarding a order from Sept 2023 for pt. Call back # [phone], secured.

## 2022-07-20 ENCOUNTER — Ambulatory Visit: Payer: BC Managed Care – PPO | Admitting: Pain Medicine

## 2022-07-21 NOTE — Telephone Encounter (Signed)
Called and spoke to [place]. They have order from September that they received verbal but never received signed order. She has faxed back over. Let her know I would send message to [facility] so that she can keep eye out for it. They will reach out if not received in next few days.

## 2022-07-21 NOTE — Telephone Encounter (Signed)
Received fax for this yesterday. It has been placed in [place] in box for completion.

## 2022-07-25 NOTE — Telephone Encounter (Signed)
[name] from [facility] called checking on status of orders? [name] stated she spoke to [place] last week regarding orders. Call back # [phone]

## 2022-07-25 NOTE — Telephone Encounter (Signed)
Noted and very sorry to learn about her passing. She was a kindhearted and wonderful patient.

## 2022-07-26 NOTE — Telephone Encounter (Signed)
Order have been faxed today, 07/26/22.

## 2022-08-04 IMAGING — CR DG KNEE COMPLETE 4+V*L*
1 series · 5 of 5 positions shown · non-contrast
Comparison: None.

CLINICAL DATA: Post amputation wound infection

EXAM:
LEFT KNEE - COMPLETE 4+ VIEW

[Series 1: dg knee complete 4 views left · 0.14mm/px · 5 of 5 slices shown]
[im 1/5]
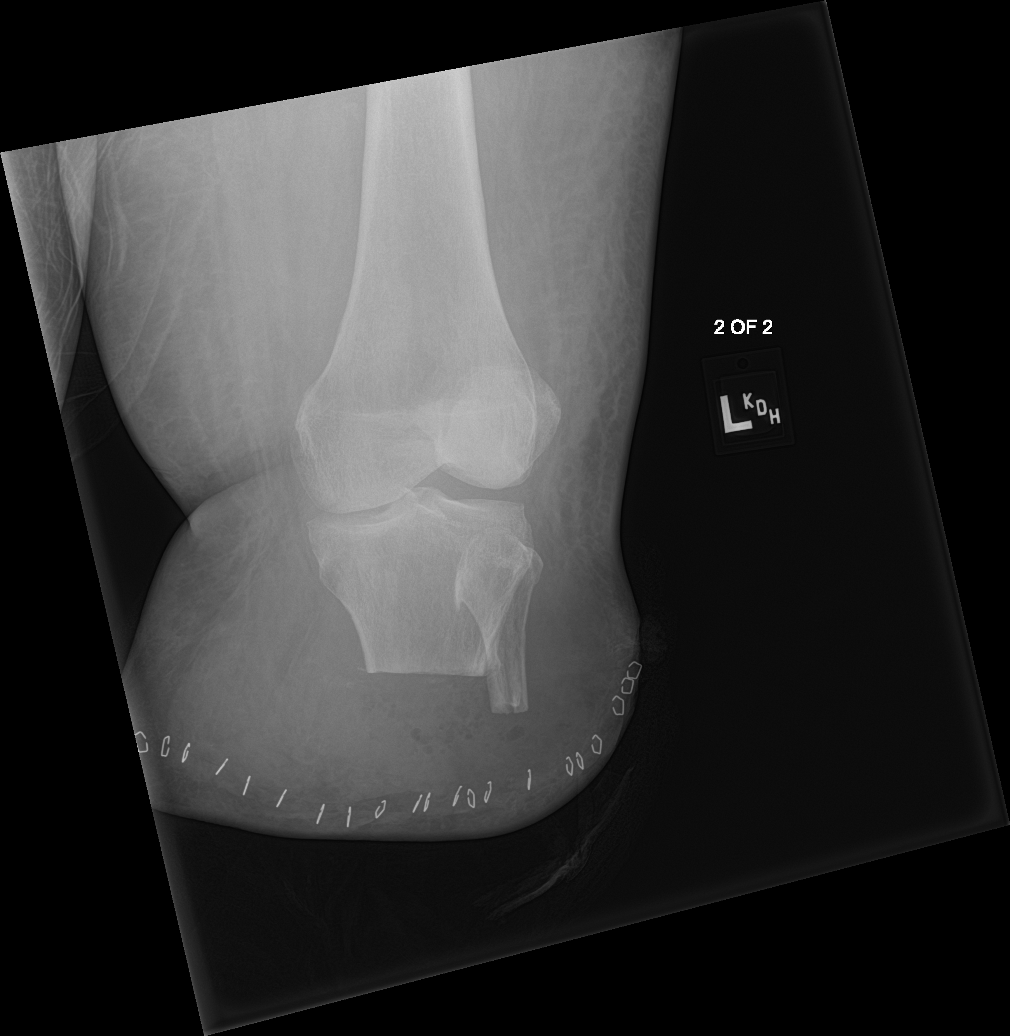
[im 2/5]
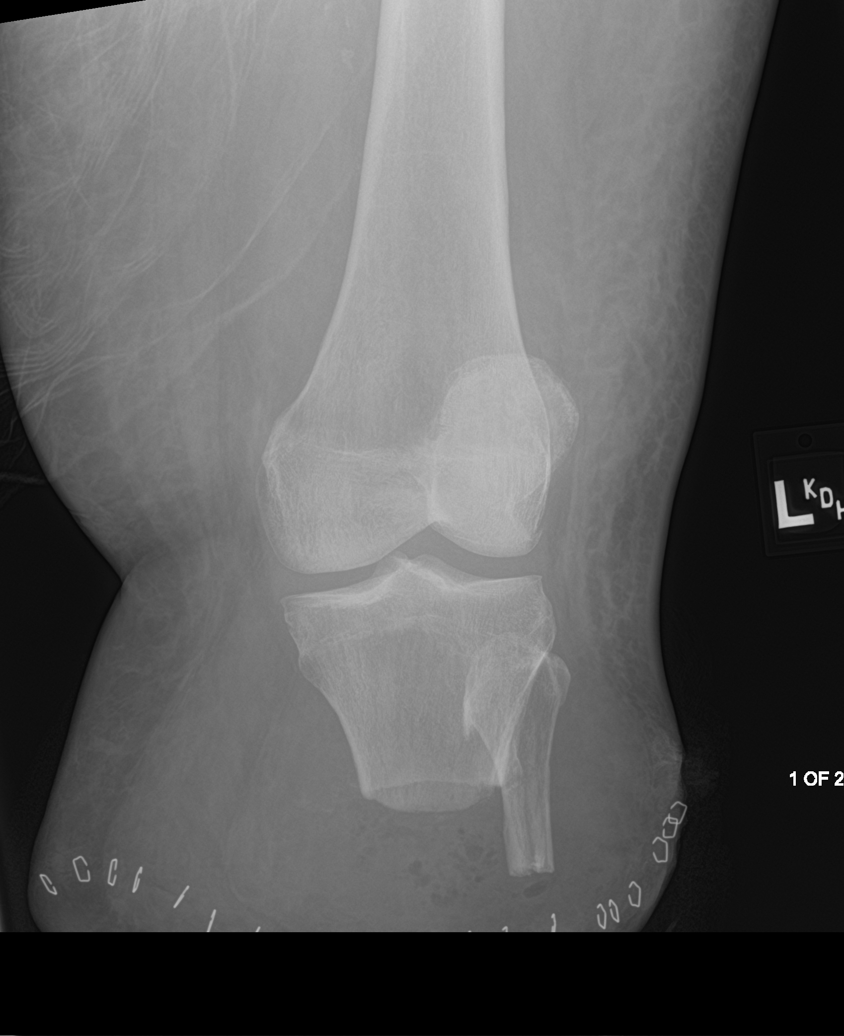
[im 3/5]
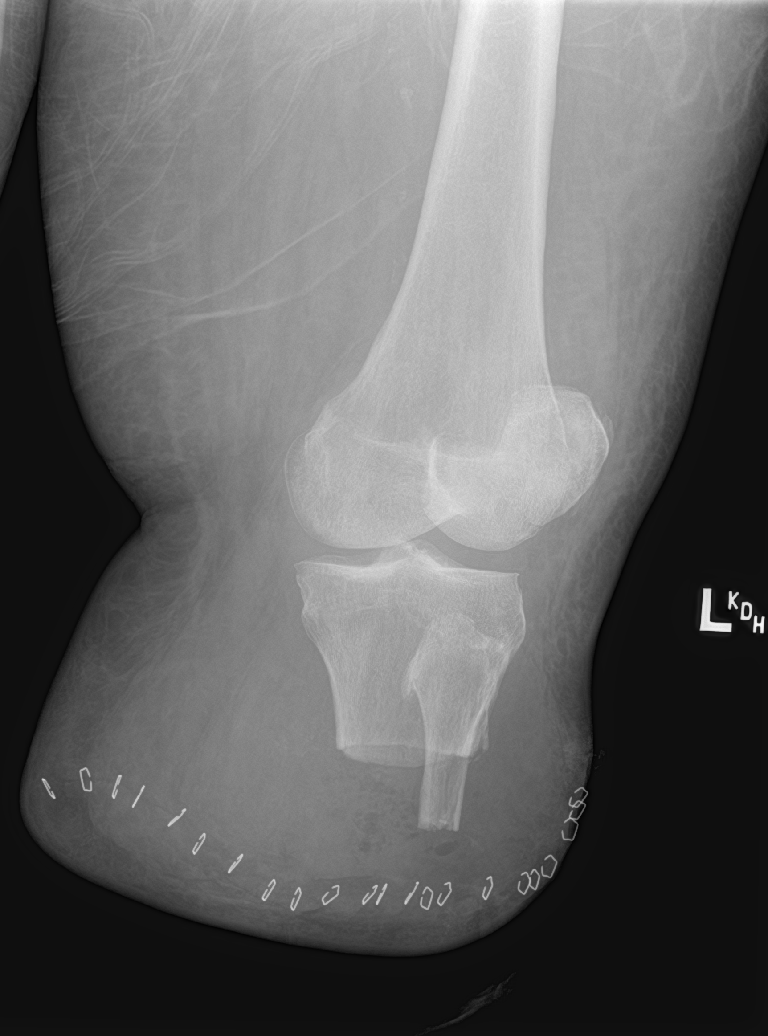
[im 4/5]
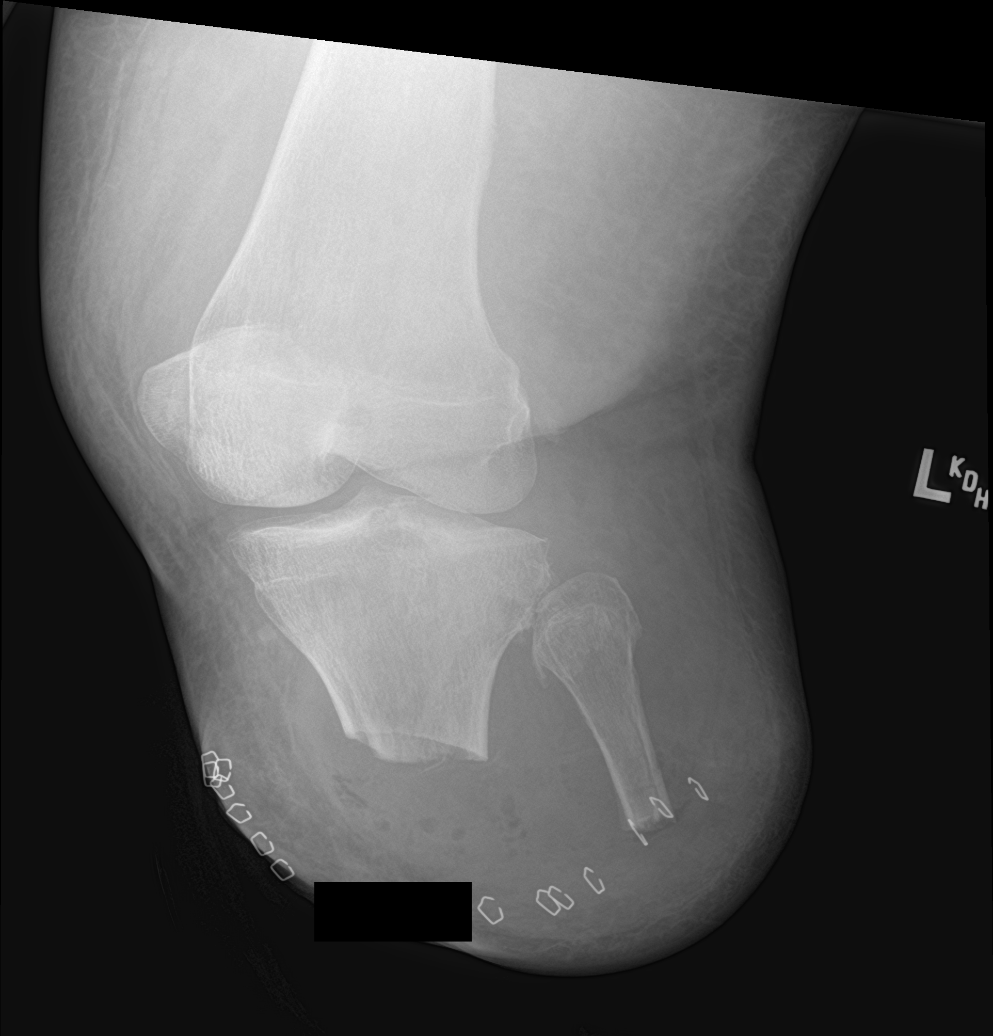
[im 5/5]
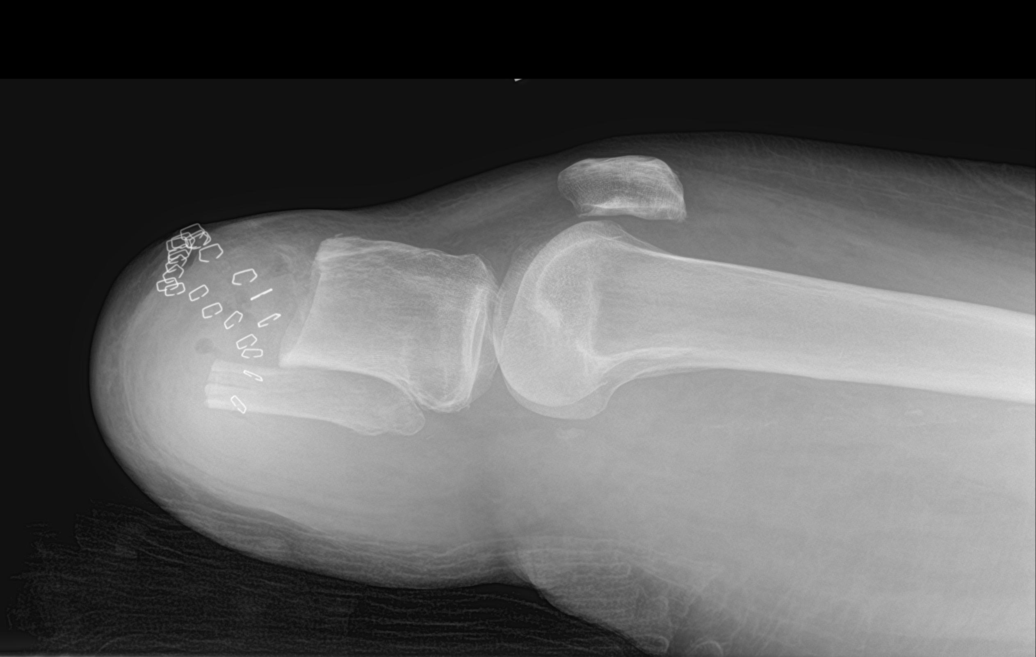

[5 of 5 positions shown; findings below may reference images not displayed]

FINDINGS: Postsurgical changes of recent below-knee amputation. The amputation
margins of the residual tibia and fibula have an expected
postoperative appearance without frank bony destruction. There is
soft tissue swelling of the distal stump with some soft tissue gas.
IMPRESSION: Postsurgical changes of recent below-knee amputation with soft
tissue swelling of the distal stump and underlying soft tissue gas.

Expected postoperative appearance of the residual tibia and fibula
amputation margins without frank bony destruction.

## 2022-08-04 IMAGING — US US EXTREM  UP VENOUS*L*
1 series · 13 of 24 positions shown · non-contrast
Comparison: None.

CLINICAL DATA: Complications with PICC line. Recent left below the
knee amputation with possible wound infection.



[Series 1: us venous img upper uni left (dvt) · portal-venous · 13 of 39 slices shown]
[im 1/39]
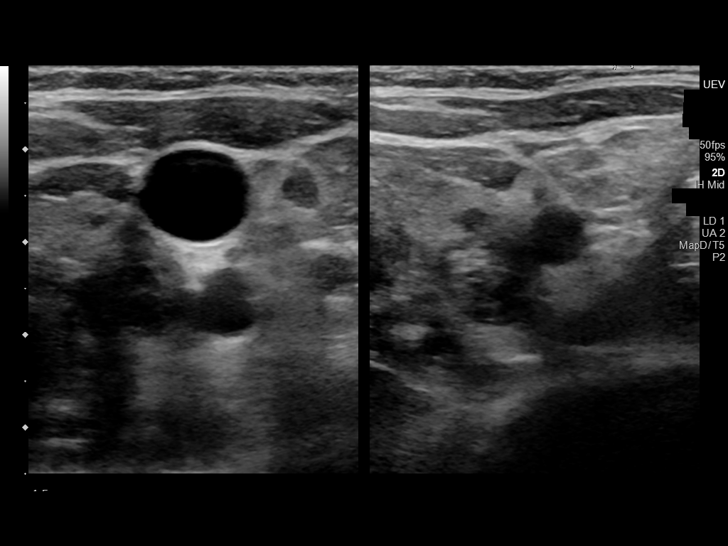
[im 4/39]
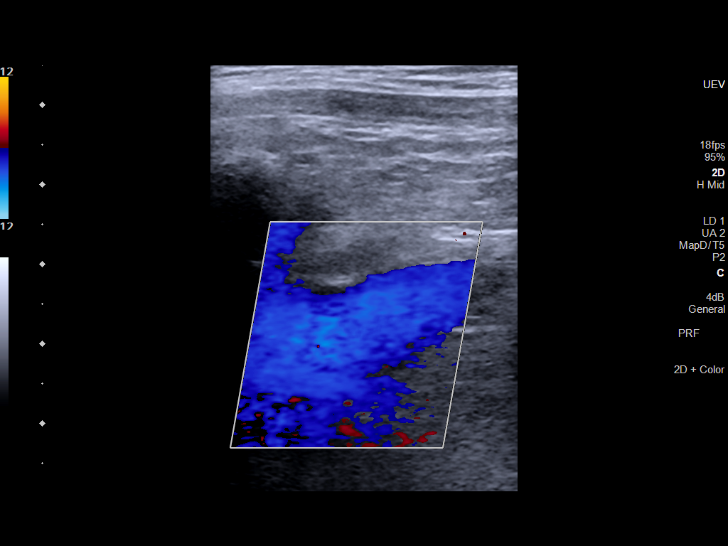
[im 7/39]
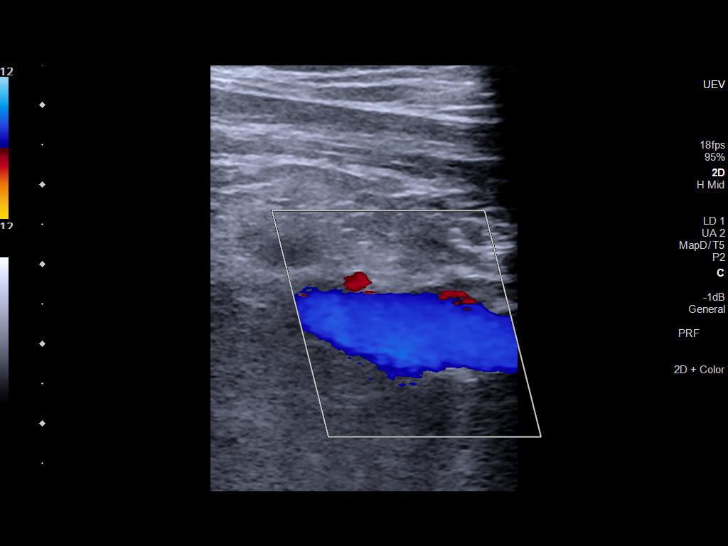
[im 10/39]
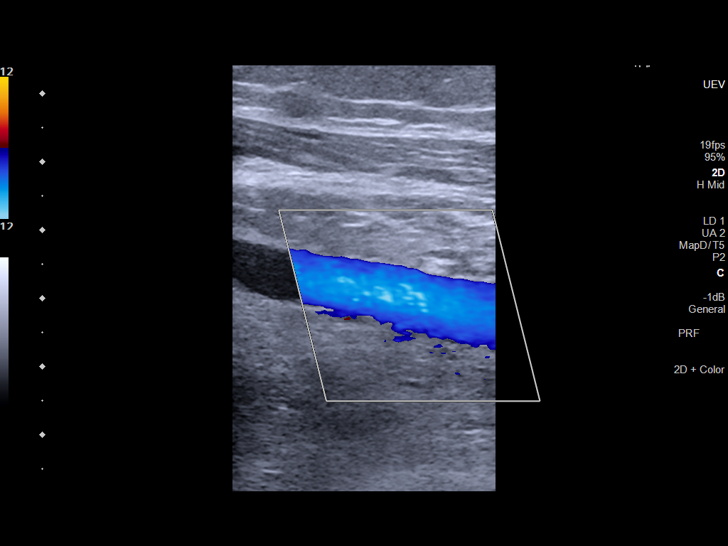
[im 14/39]
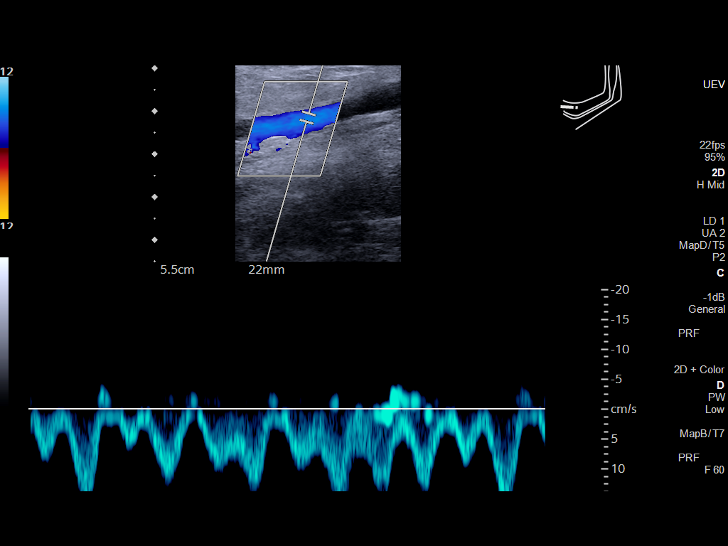
[im 17/39]
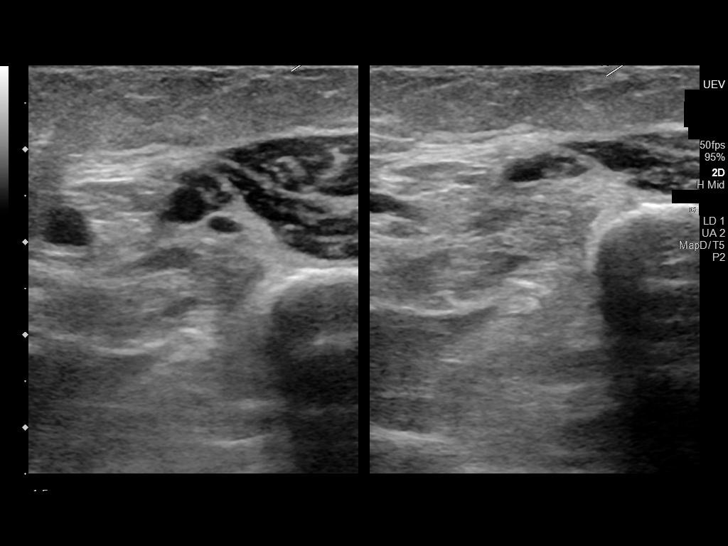
[im 20/39]
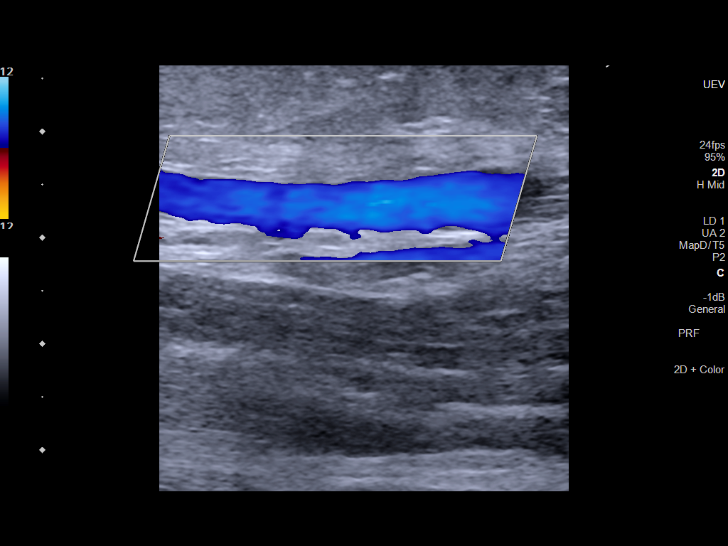
[im 22/39]
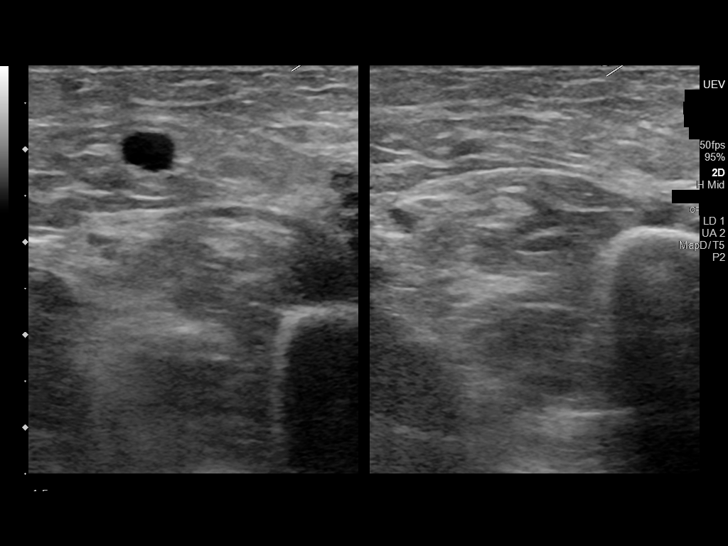
[im 25/39]
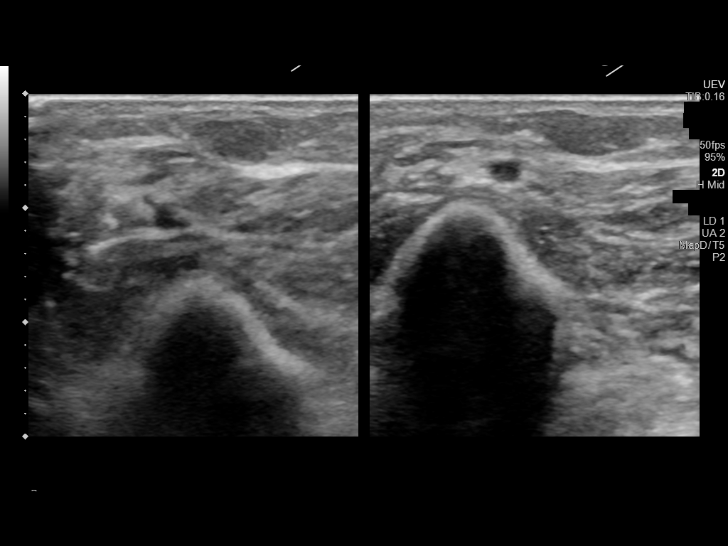
[im 29/39]
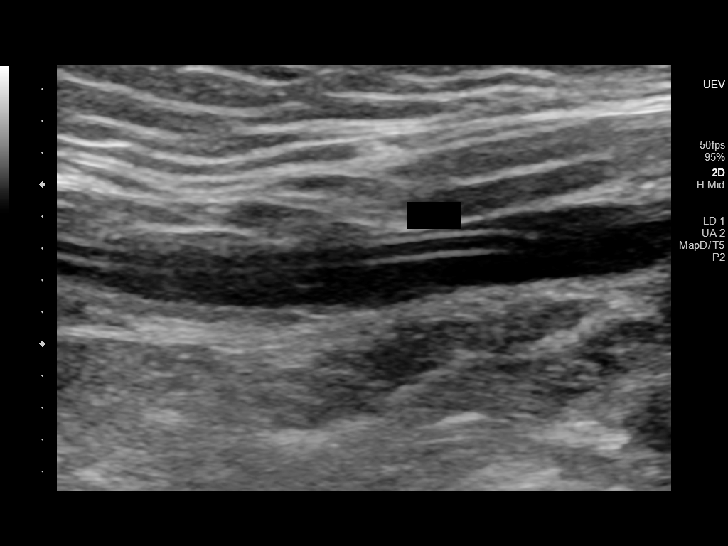
[im 32/39]
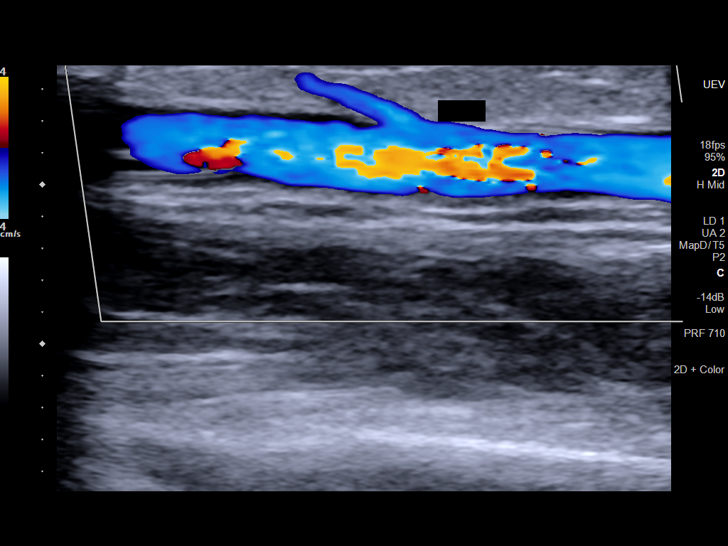
[im 35/39]
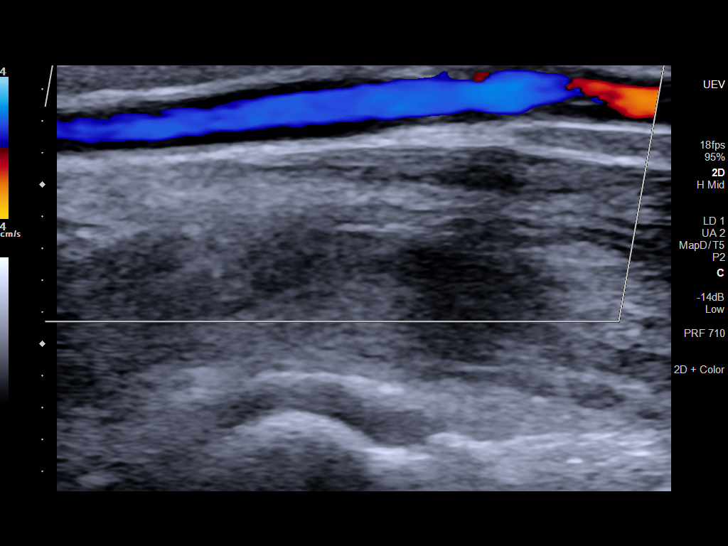
[im 39/39]
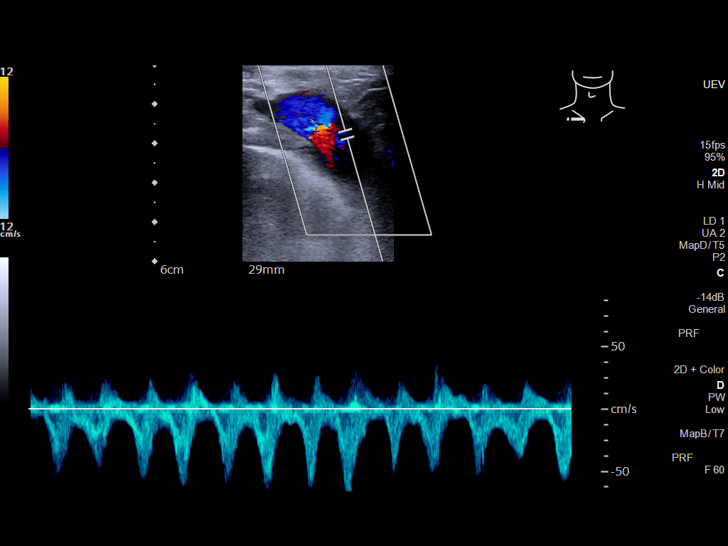

[13 of 24 positions shown; findings below may reference images not displayed]

FINDINGS: Contralateral Subclavian Vein: Respiratory phasicity is normal and
symmetric with the symptomatic side. No evidence of thrombus. Normal
compressibility.

Internal Jugular Vein: No evidence of thrombus. Normal
compressibility, respiratory phasicity and response to augmentation.

Subclavian Vein: No evidence of thrombus. Normal compressibility,
respiratory phasicity and response to augmentation.

Axillary Vein: No evidence of thrombus. Normal compressibility,
respiratory phasicity and response to augmentation.

Cephalic Vein: No evidence of thrombus. Normal compressibility,
respiratory phasicity and response to augmentation.

Basilic Vein: No evidence of thrombus. Normal compressibility,
respiratory phasicity and response to augmentation.

Brachial Veins: No evidence of thrombus. Normal compressibility,
respiratory phasicity and response to augmentation.

Radial Veins: No evidence of thrombus. Normal compressibility,
respiratory phasicity and response to augmentation.

Ulnar Veins: No evidence of thrombus. Normal compressibility,
respiratory phasicity and response to augmentation.

Venous Reflux:  None visualized.

Other Findings:  The PICC line is seen within the cephalic vein.
IMPRESSION: No evidence of DVT within the left upper extremity.

## 2022-08-04 IMAGING — CR DG FOOT COMPLETE 3+V*R*
1 series · 3 of 3 positions shown · non-contrast
Comparison: None.

CLINICAL DATA: Wound infection

EXAM:
RIGHT FOOT COMPLETE - 3+ VIEW

[Series 1: dg foot complete right · 0.14mm/px · 3 of 3 slices shown]
[im 1/3]
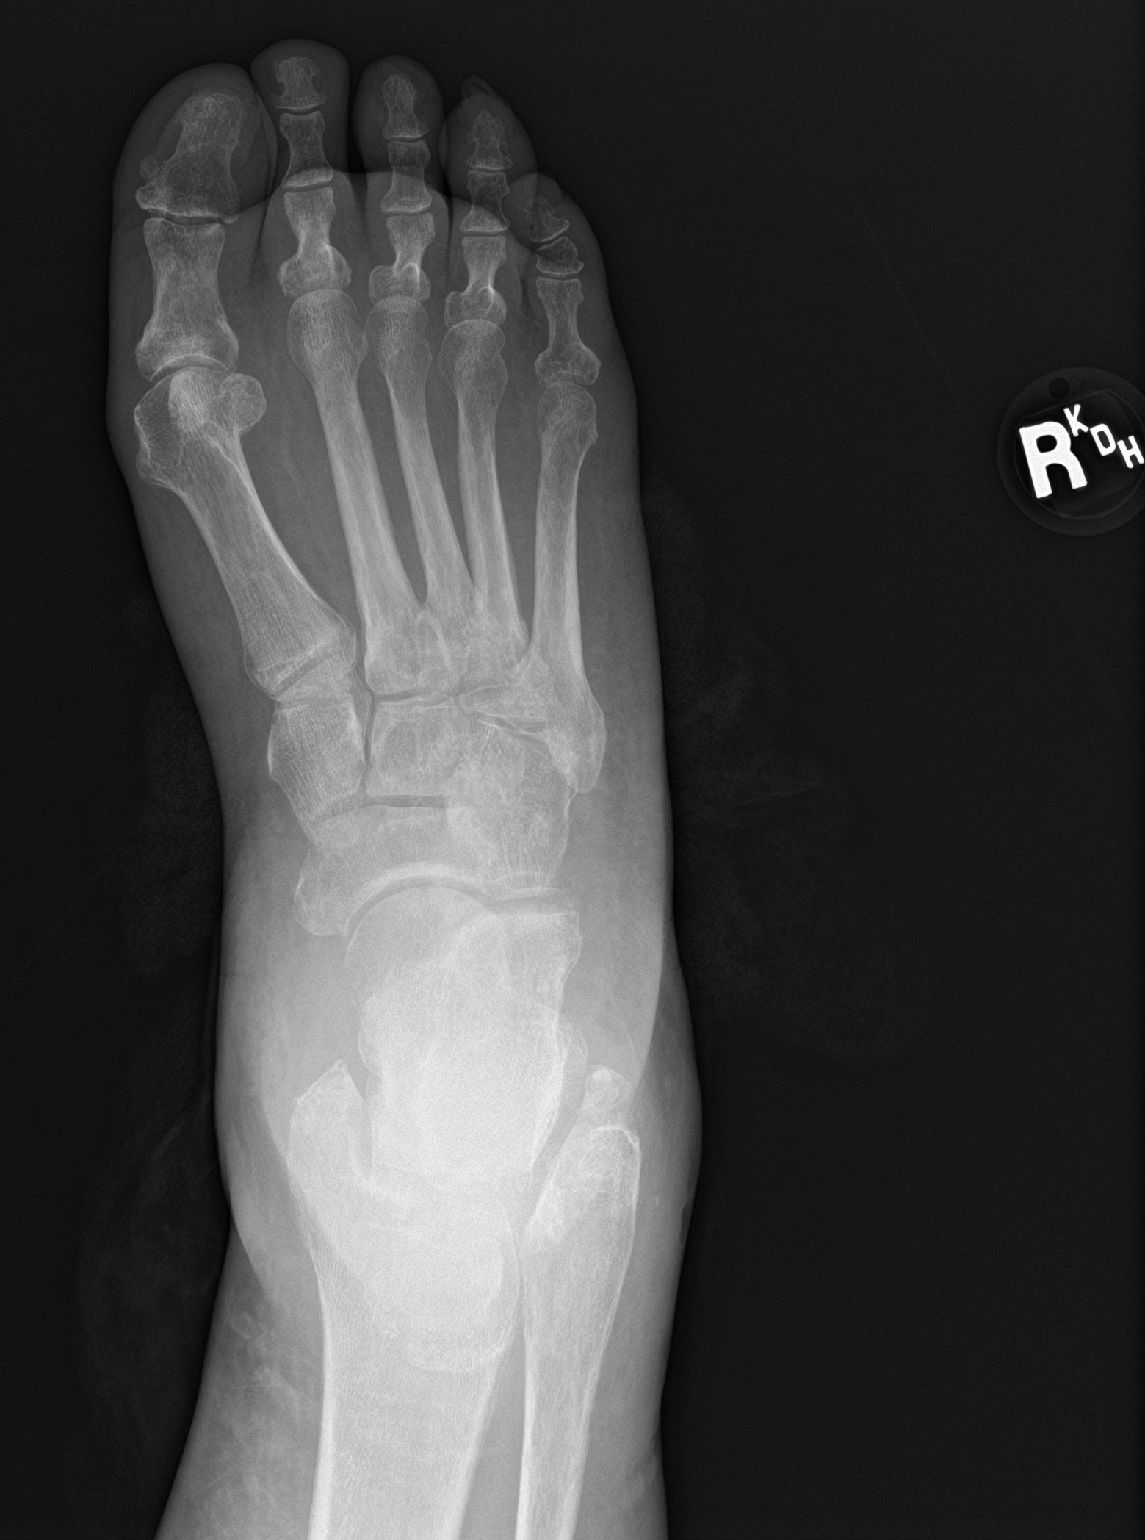
[im 2/3]
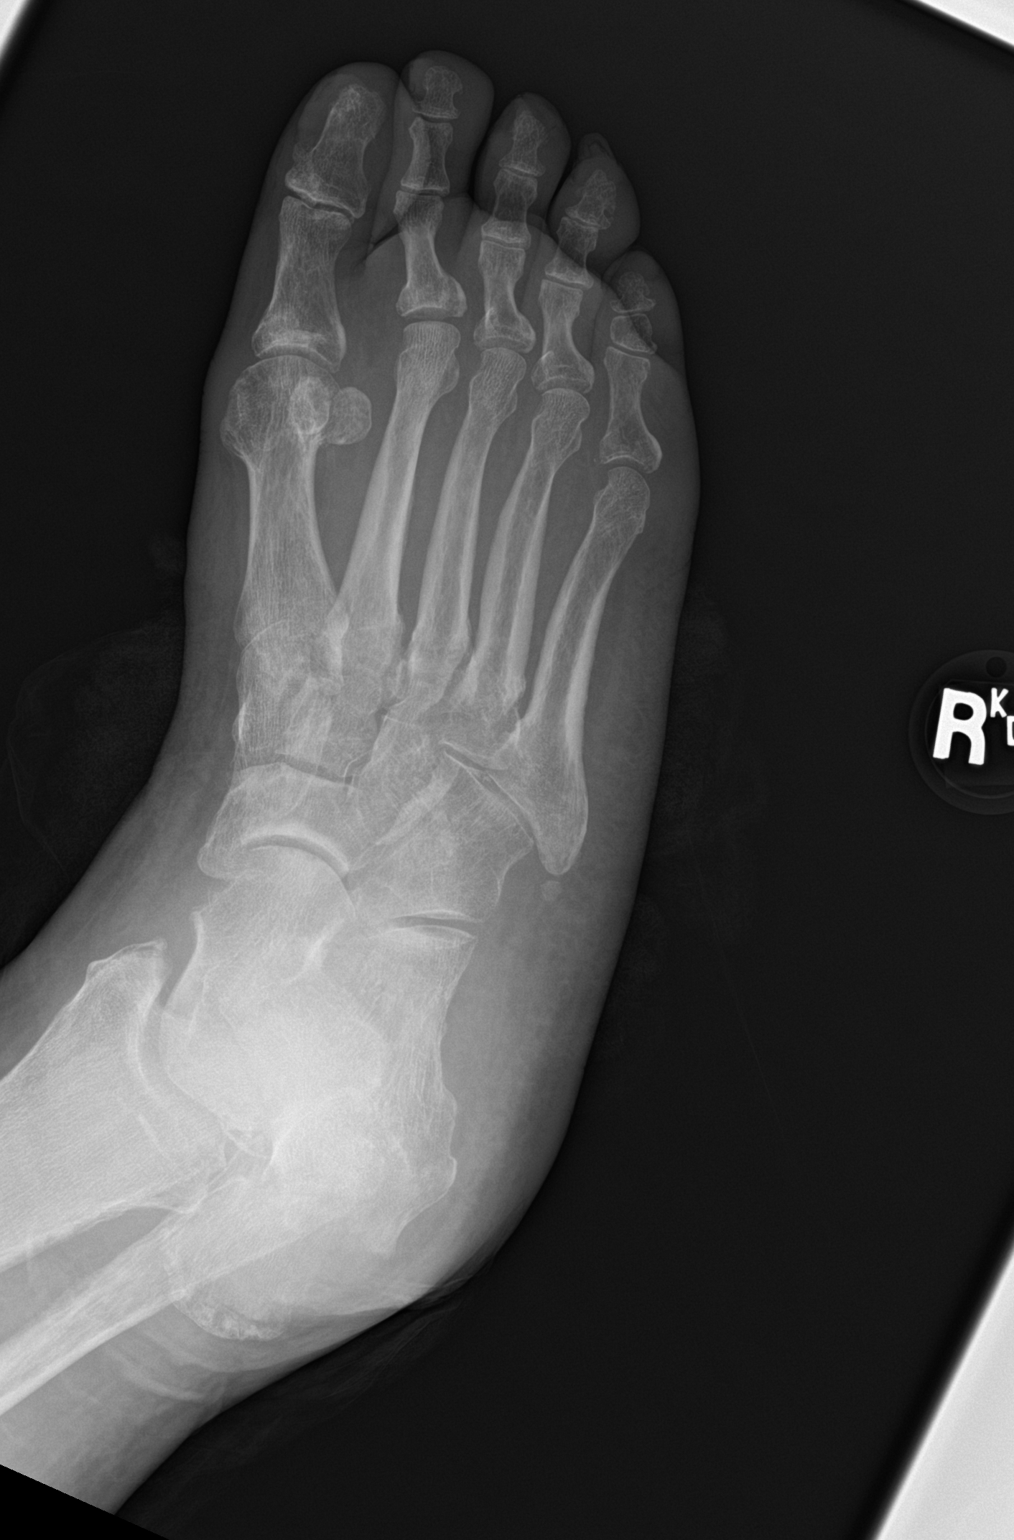
[im 3/3]
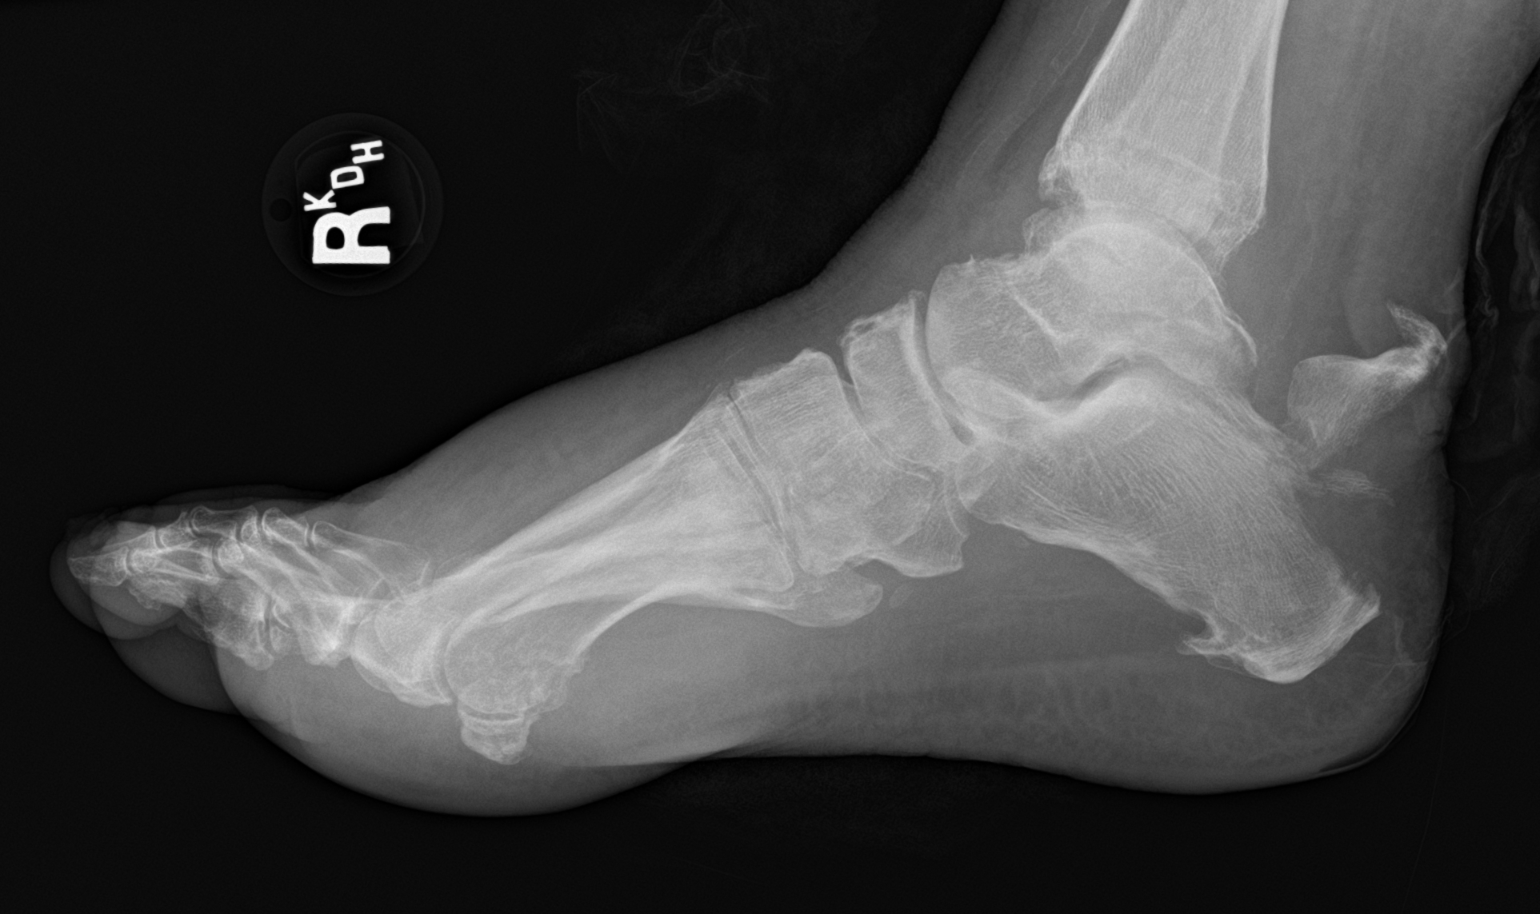

[3 of 3 positions shown; findings below may reference images not displayed]

FINDINGS: There is a tongue-type calcaneal fracture with severe displacement
and evidence of adjacent posterior soft tissue wound. The fracture
line extends towards the posterior subtalar joint. Large plantar
calcaneal spur. Diffuse soft tissue swelling of the foot.

There is mild tibiotalar and moderate midfoot osteoarthritis. There
is moderate moderate degenerative changes in the forefoot. There is
diffuse soft tissue swelling.
IMPRESSION: Tongue-type posterior calcaneus fracture with severe displacement
and extension of the fracture line towards the posterior subtalar
joint. Evidence of adjacent posterior soft tissue wound. Diffuse
right foot soft tissue swelling.

## 2022-08-04 IMAGING — CR DG CHEST 2V
1 series · 2 of 2 positions shown · non-contrast
Comparison: 10/15/2021

CLINICAL DATA: Anemia, hypoxia

EXAM:
CHEST - 2 VIEW

[Series 1: dg chest 2 view · 0.14mm/px · 2 of 2 slices shown]
[im 1/2]
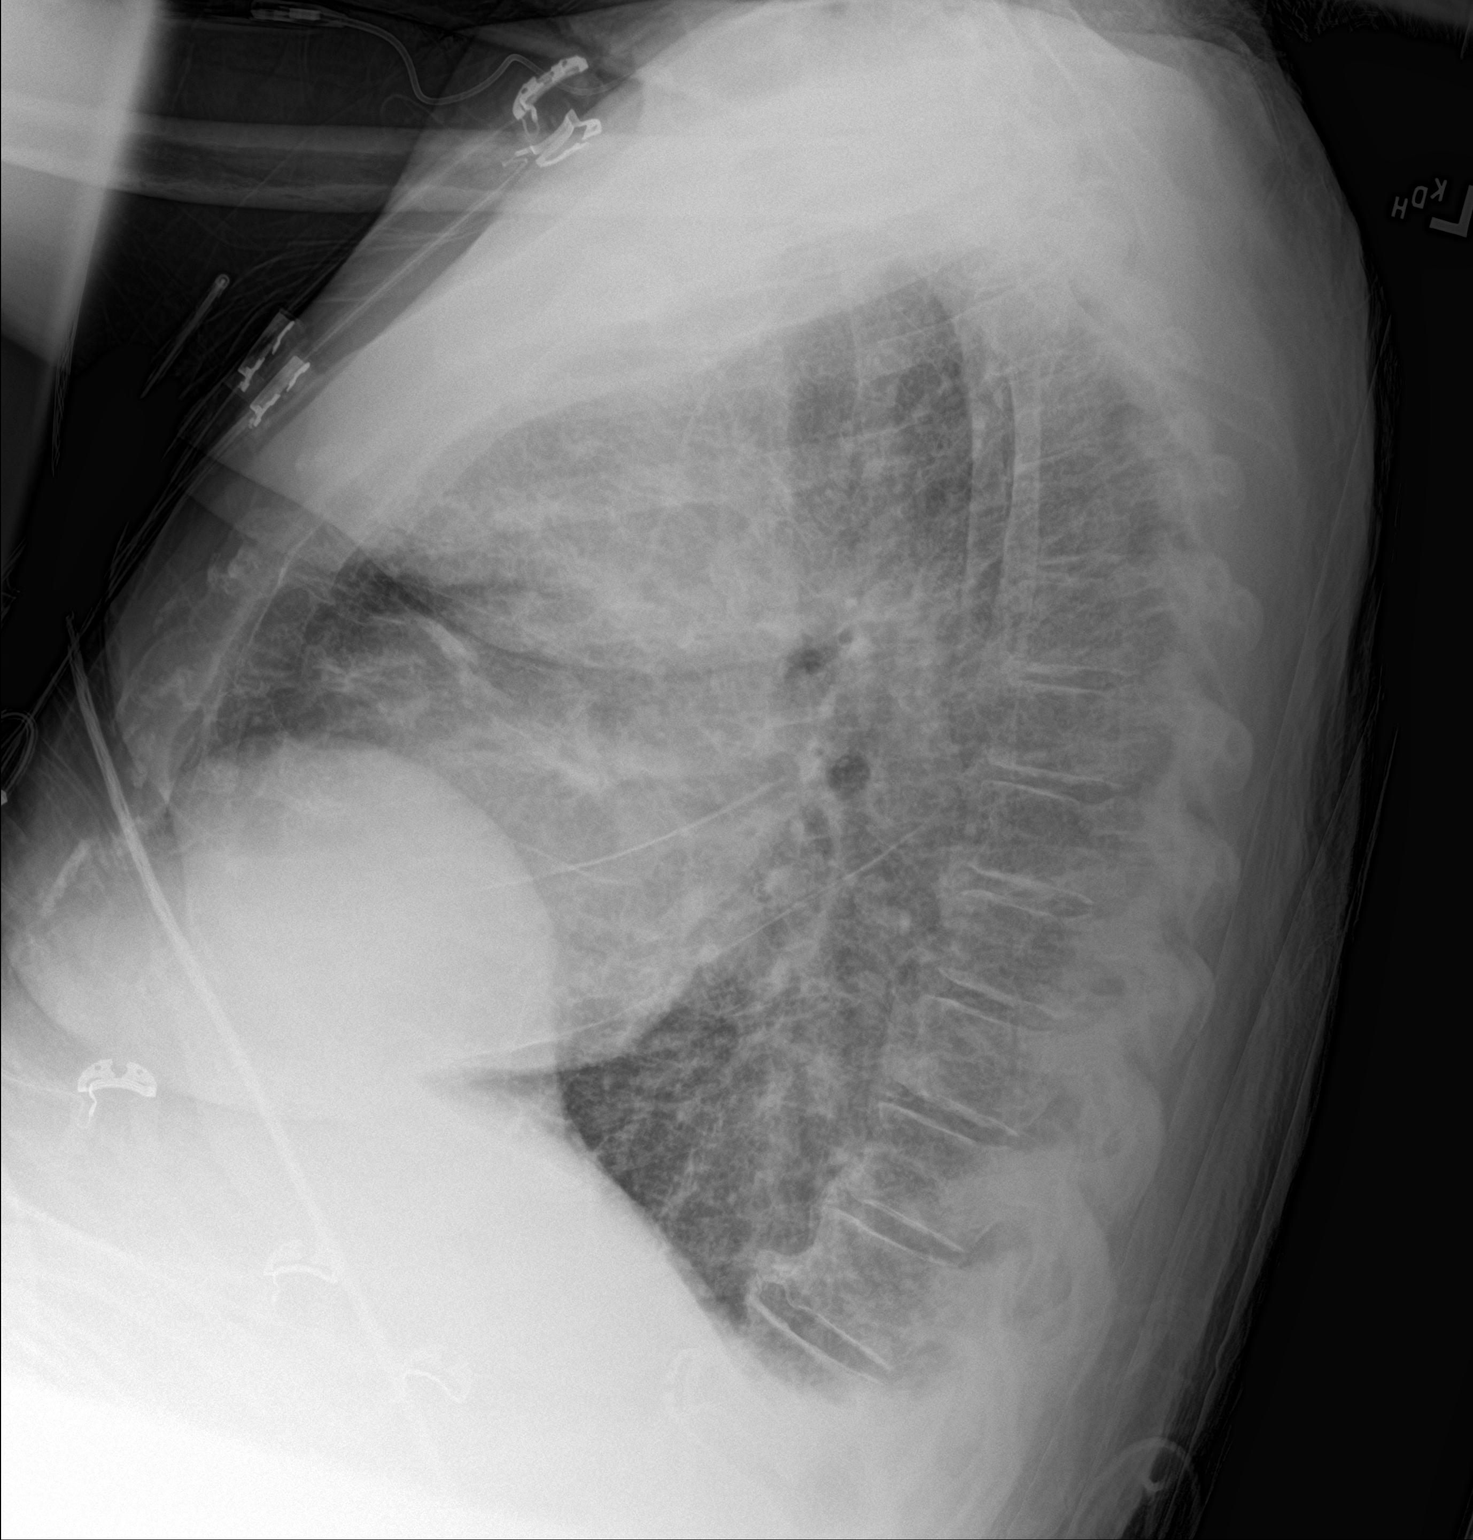
[im 2/2]
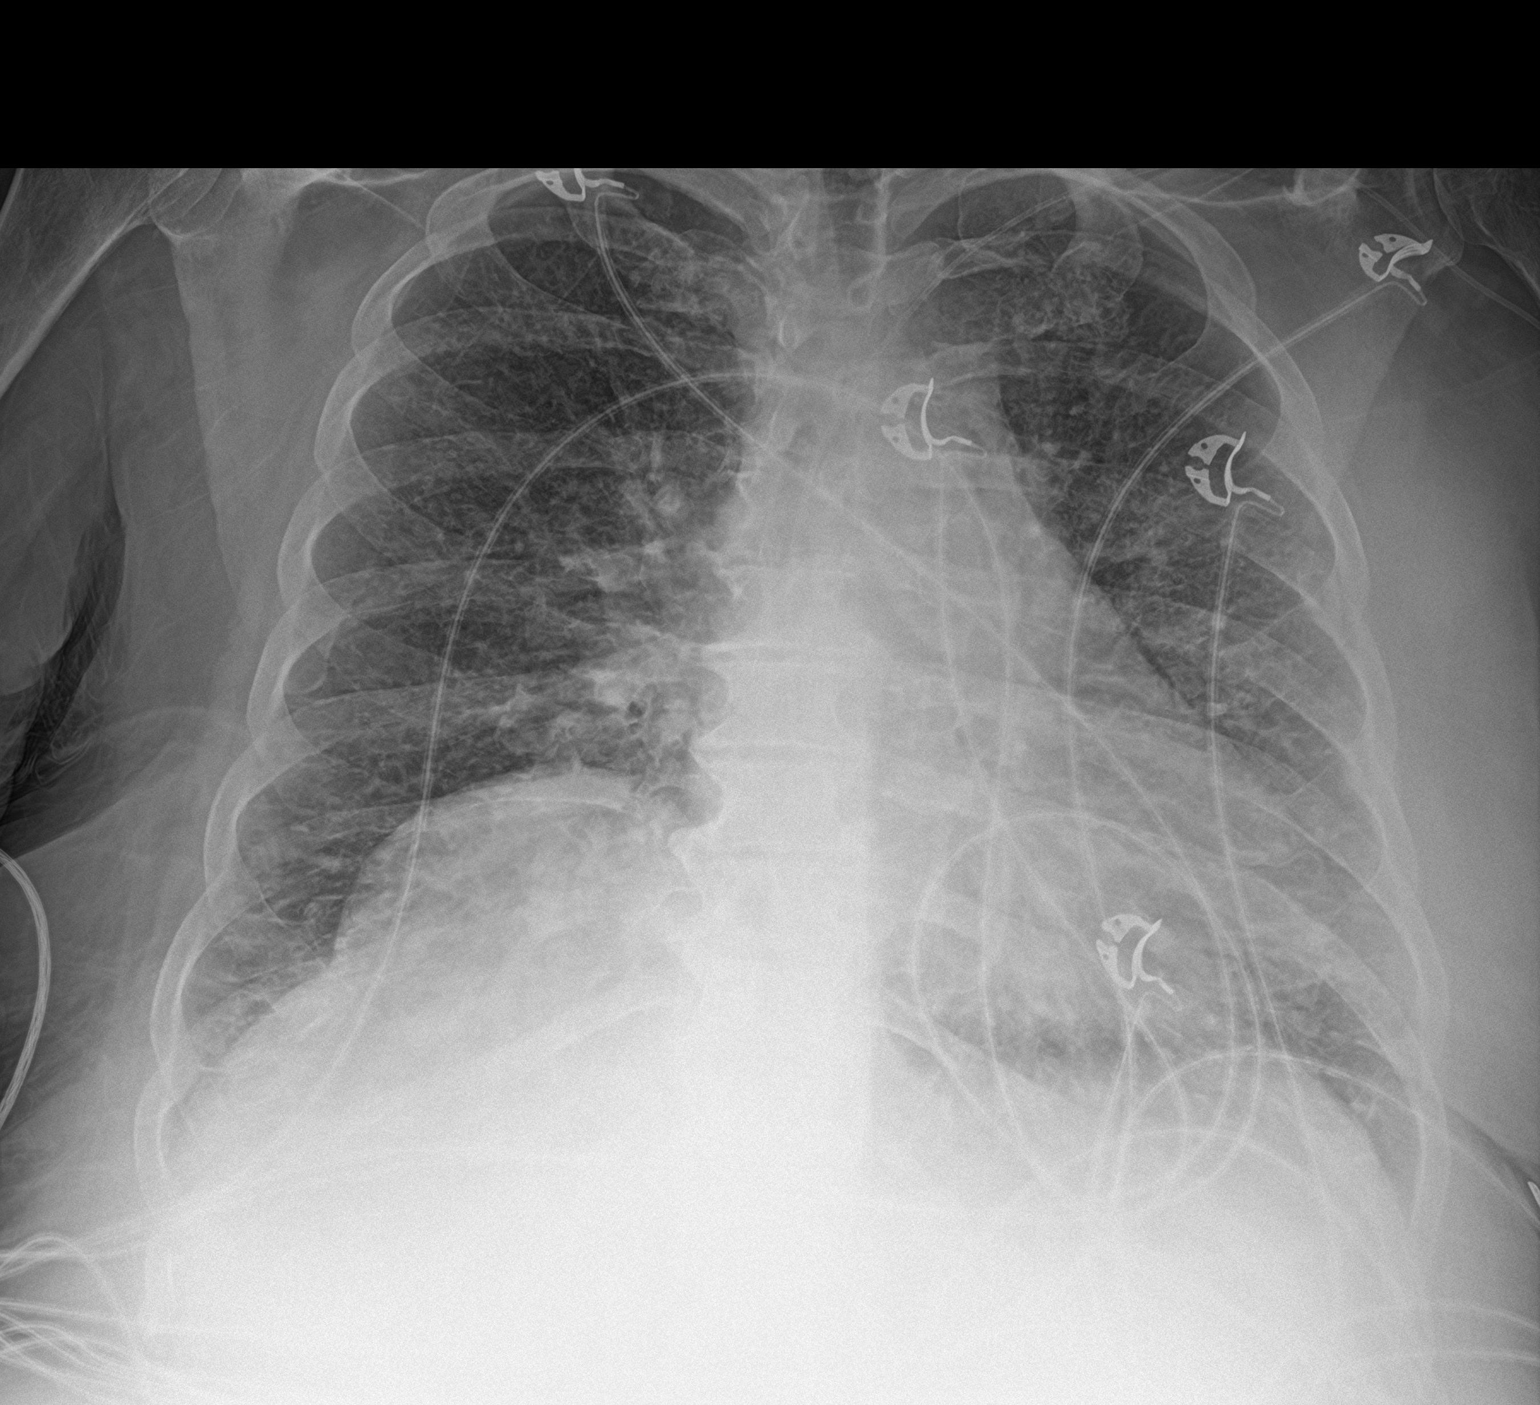

[2 of 2 positions shown; findings below may reference images not displayed]

FINDINGS: Heart is enlarged with diffuse bilateral increased interstitial
opacities compatible with interstitial edema. Suspect early CHF.
Trace pleural effusions posteriorly on the lateral view. Anterior
eventration of the right hemidiaphragm. Trachea midline.
Degenerative changes of the spine.
IMPRESSION: Cardiomegaly with interstitial edema and basilar atelectasis
compatible with early CHF pattern.

Trace pleural effusions.

## 2022-08-11 ENCOUNTER — Other Ambulatory Visit: Payer: BC Managed Care – PPO

## 2022-08-11 ENCOUNTER — Ambulatory Visit: Payer: BC Managed Care – PPO | Admitting: Internal Medicine

## 2022-08-11 ENCOUNTER — Encounter: Payer: BC Managed Care – PPO | Admitting: Hospice and Palliative Medicine

## 2022-08-11 ENCOUNTER — Ambulatory Visit: Payer: BC Managed Care – PPO

## 2022-09-13 IMAGING — DX DG KNEE 1-2V*L*
2 series · 2 of 2 positions shown · non-contrast
Comparison: November 04, 2021

CLINICAL DATA: Amputation stump infection.

EXAM:
LEFT KNEE - 1-2 VIEW

[knee ap]
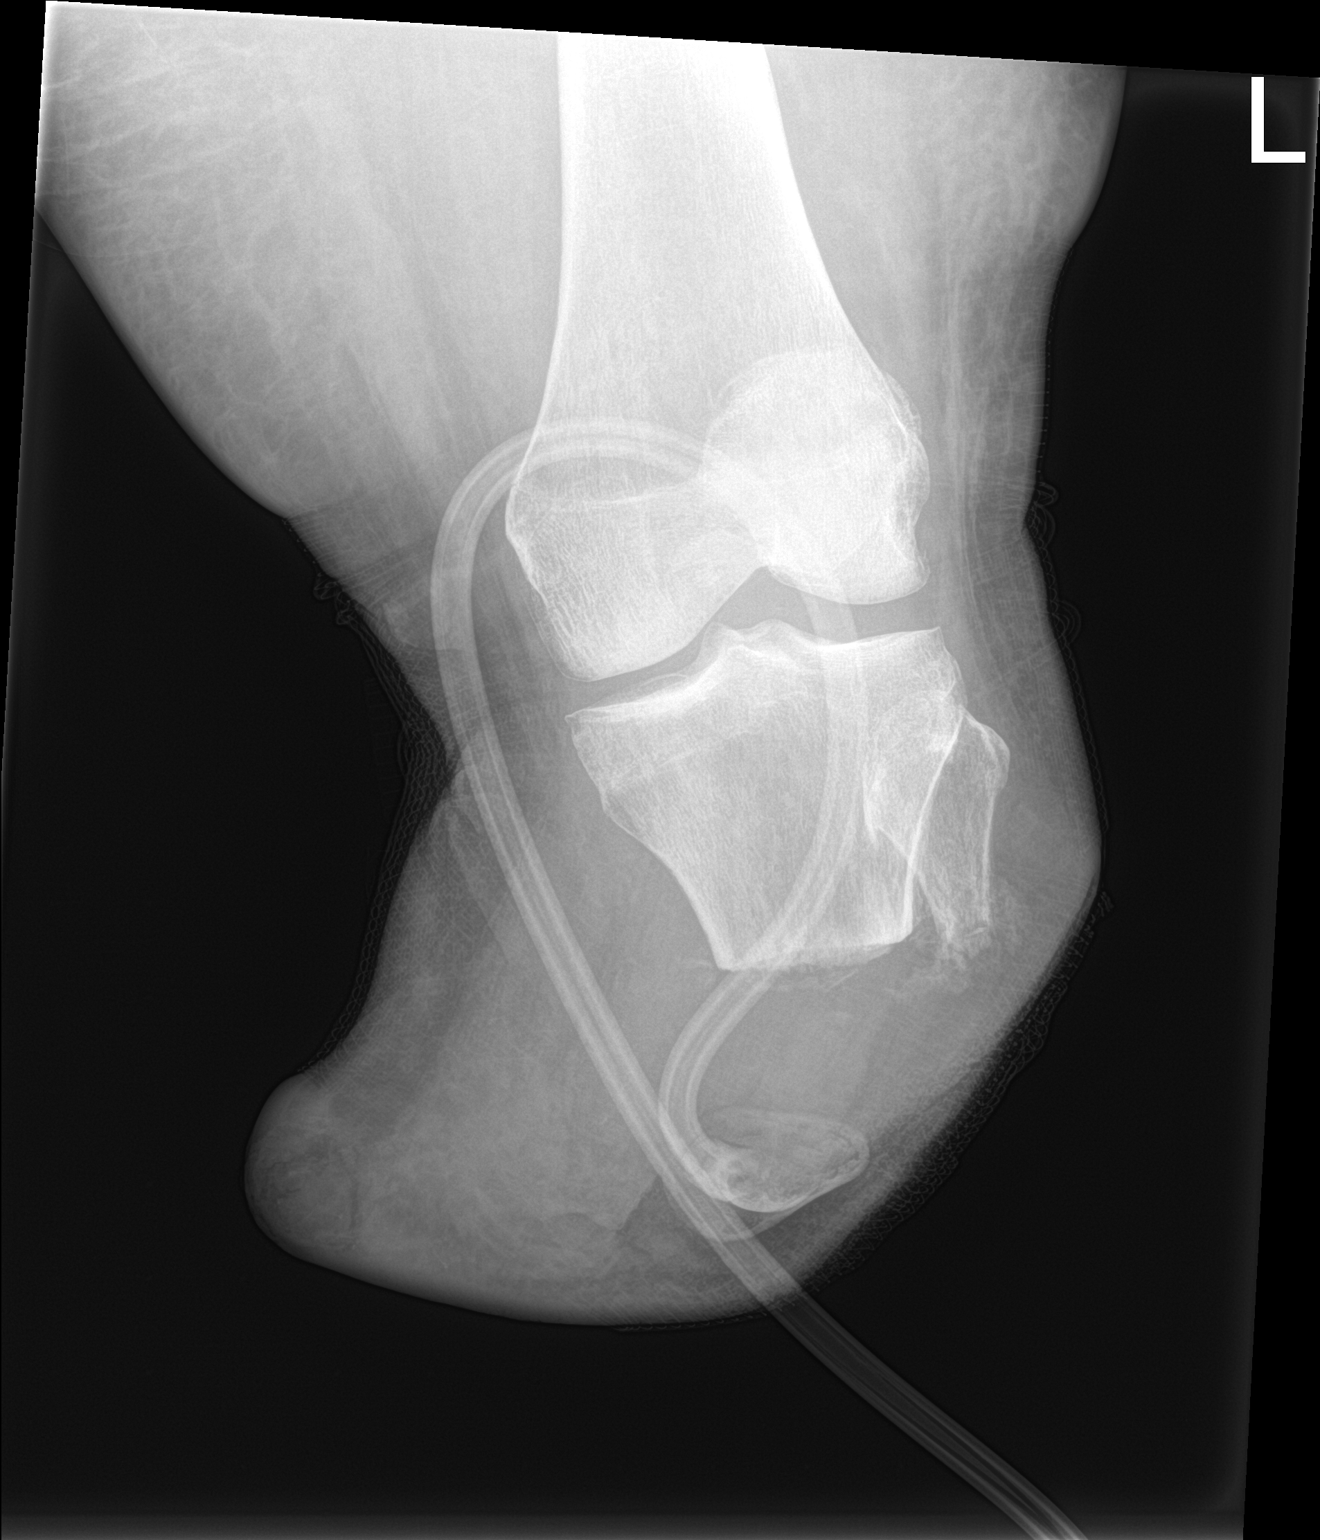

[knee lat]
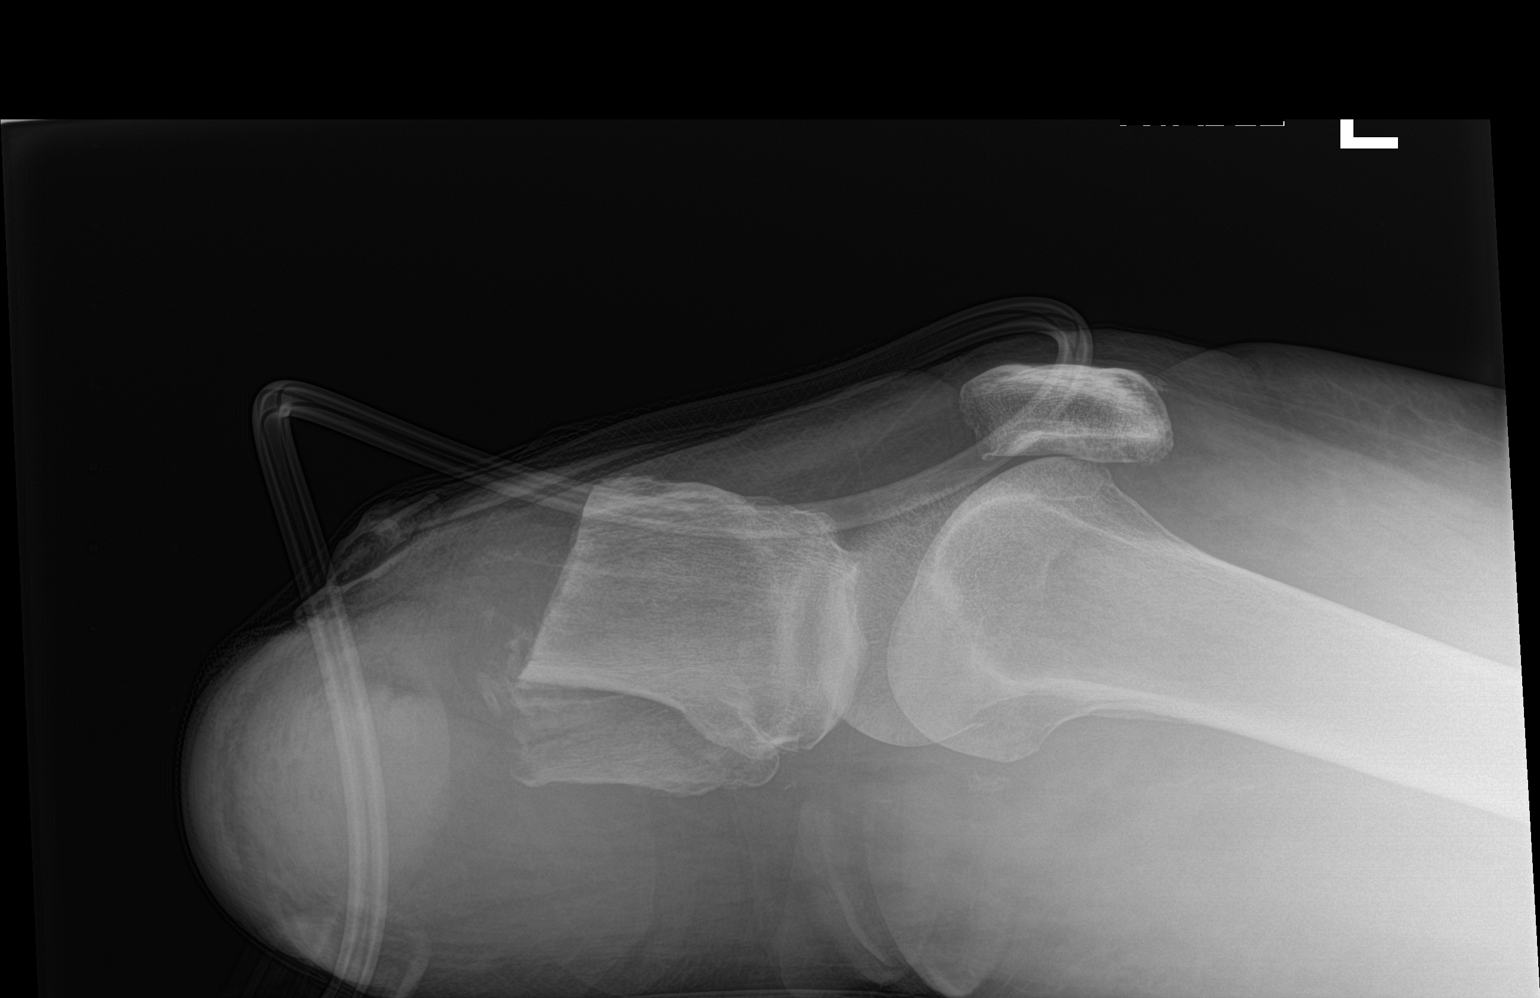

[2 of 2 positions shown; findings below may reference images not displayed]

FINDINGS: Post below the knee amputation. There has been callus formation at
the amputation sites of the tibia and fibula. Suspected osteolytic
changes at the attention site of the fibula, suspicious for
osteomyelitis. No free gas within the soft tissues is seen.
IMPRESSION: 1. Status post below the knee amputation with callus formation.
2. Suspected osteolytic changes at the amputation site of the
fibula, suspicious for osteomyelitis.
3. No free gas within the soft tissues.
4. These results will be called to the ordering clinician or
representative by the Radiologist Assistant, and communication
documented in the PACS or [REDACTED].

## 2022-10-21 ENCOUNTER — Ambulatory Visit (INDEPENDENT_AMBULATORY_CARE_PROVIDER_SITE_OTHER): Payer: Medicare Other | Admitting: Nurse Practitioner
